# Patient Record
Sex: Female | Born: 1960
Health system: Southern US, Community
[De-identification: ages and names within clinical notes are randomized; demographics above are authoritative.]

## PROBLEM LIST (undated history)

## (undated) DIAGNOSIS — I10 Essential (primary) hypertension: Secondary | ICD-10-CM

## (undated) DIAGNOSIS — Z299 Encounter for prophylactic measures, unspecified: Secondary | ICD-10-CM

## (undated) DIAGNOSIS — I5021 Acute systolic (congestive) heart failure: Secondary | ICD-10-CM

## (undated) DIAGNOSIS — I480 Paroxysmal atrial fibrillation: Secondary | ICD-10-CM

## (undated) DIAGNOSIS — Z95810 Presence of automatic (implantable) cardiac defibrillator: Secondary | ICD-10-CM

## (undated) DIAGNOSIS — Z950 Presence of cardiac pacemaker: Secondary | ICD-10-CM

## (undated) DIAGNOSIS — J45909 Unspecified asthma, uncomplicated: Secondary | ICD-10-CM

## (undated) DIAGNOSIS — Z951 Presence of aortocoronary bypass graft: Secondary | ICD-10-CM

## (undated) DIAGNOSIS — F419 Anxiety disorder, unspecified: Secondary | ICD-10-CM

## (undated) DIAGNOSIS — T7840XA Allergy, unspecified, initial encounter: Secondary | ICD-10-CM

## (undated) DIAGNOSIS — C3490 Malignant neoplasm of unspecified part of unspecified bronchus or lung: Secondary | ICD-10-CM

## (undated) HISTORY — DX: Allergy, unspecified, initial encounter: T78.40XA

## (undated) HISTORY — DX: Unspecified asthma, uncomplicated: J45.909

## (undated) HISTORY — DX: Anxiety disorder, unspecified: F41.9

## (undated) HISTORY — DX: Malignant neoplasm of unspecified part of unspecified bronchus or lung: C34.90

## (undated) HISTORY — PX: TUBAL LIGATION: SHX77

## (undated) SURGERY — BRONCHOSCOPY, WITH EBUS
Anesthesia: General | Laterality: Right

---

## 2002-10-13 ENCOUNTER — Emergency Department (HOSPITAL_COMMUNITY): Admission: EM | Admit: 2002-10-13 | Discharge: 2002-10-13 | Payer: Self-pay | Admitting: Emergency Medicine

## 2003-07-15 ENCOUNTER — Other Ambulatory Visit: Admission: RE | Admit: 2003-07-15 | Discharge: 2003-07-15 | Payer: Self-pay | Admitting: Obstetrics and Gynecology

## 2003-08-28 ENCOUNTER — Ambulatory Visit (HOSPITAL_COMMUNITY): Admission: RE | Admit: 2003-08-28 | Discharge: 2003-08-28 | Payer: Self-pay | Admitting: Obstetrics and Gynecology

## 2003-08-28 ENCOUNTER — Encounter (INDEPENDENT_AMBULATORY_CARE_PROVIDER_SITE_OTHER): Payer: Self-pay

## 2004-03-23 ENCOUNTER — Emergency Department (HOSPITAL_COMMUNITY): Admission: EM | Admit: 2004-03-23 | Discharge: 2004-03-23 | Payer: Self-pay | Admitting: Emergency Medicine

## 2006-01-23 ENCOUNTER — Other Ambulatory Visit: Admission: RE | Admit: 2006-01-23 | Discharge: 2006-01-23 | Payer: Self-pay | Admitting: Obstetrics and Gynecology

## 2007-02-22 ENCOUNTER — Ambulatory Visit (HOSPITAL_COMMUNITY): Admission: RE | Admit: 2007-02-22 | Discharge: 2007-02-23 | Payer: Self-pay | Admitting: *Deleted

## 2007-02-22 ENCOUNTER — Encounter (INDEPENDENT_AMBULATORY_CARE_PROVIDER_SITE_OTHER): Payer: Self-pay | Admitting: Specialist

## 2007-09-26 ENCOUNTER — Ambulatory Visit (HOSPITAL_COMMUNITY): Admission: RE | Admit: 2007-09-26 | Discharge: 2007-09-26 | Payer: Self-pay | Admitting: *Deleted

## 2007-10-03 ENCOUNTER — Encounter: Admission: RE | Admit: 2007-10-03 | Discharge: 2007-10-03 | Payer: Self-pay | Admitting: *Deleted

## 2008-05-25 ENCOUNTER — Emergency Department (HOSPITAL_COMMUNITY): Admission: EM | Admit: 2008-05-25 | Discharge: 2008-05-25 | Payer: Self-pay | Admitting: Emergency Medicine

## 2008-12-16 ENCOUNTER — Encounter: Admission: RE | Admit: 2008-12-16 | Discharge: 2008-12-16 | Payer: Self-pay | Admitting: Obstetrics and Gynecology

## 2010-01-11 ENCOUNTER — Ambulatory Visit (HOSPITAL_COMMUNITY): Admission: RE | Admit: 2010-01-11 | Discharge: 2010-01-11 | Payer: Self-pay | Admitting: Obstetrics

## 2011-01-01 ENCOUNTER — Encounter: Payer: Self-pay | Admitting: *Deleted

## 2011-04-28 NOTE — Op Note (Signed)
NAME:  Brittany [name], Brittany [name]                        ACCOUNT NO.:  [id number]   MEDICAL RECORD NO.:  [id number]                   PATIENT TYPE:  AMB   LOCATION:  SDC                                  FACILITY:  [facility]   PHYSICIAN:  James A. Ashley Royalty, M.D.             DATE OF BIRTH:  [birth year]   DATE OF PROCEDURE:  08/28/2003  DATE OF DISCHARGE:                                 OPERATIVE REPORT   PREOPERATIVE DIAGNOSES:  1. Fibroid uterus.  2. Uterine polyp.  3. Abnormal uterine bleeding.   POSTOPERATIVE DIAGNOSES:  1. Fibroid uterus.  2. Uterine polyp.  3. Abnormal uterine bleeding.  4. Pathology pending.   PROCEDURES:  1. Diagnostic/operative hysteroscopy with polypectomy.  2. Dilatation and curettage.   SURGEON:  Rudy Jew. Ashley Royalty, M.D.   ANESTHESIA:  General.   ESTIMATED BLOOD LOSS:  50 mL.   COMPLICATIONS:  None.   PACKS AND DRAINS:  None.   DEFICIT:  400 mL.   PROCEDURE:  The patient was taken to the operating room and placed in the  dorsal supine position.  After adequate general anesthesia was administered,  she was placed in the lithotomy position and prepped and draped in the usual  manner for vaginal surgery.  A posterior weighted retractor was placed per  the vagina.  The anterior lip of the cervix was grasped with a single-tooth  tenaculum.  The uterus was gently sounded to approximately 8 cm and noted to  be anteverted.  The cervix was then serially dilated to a size 29 [place]  using [employer] dilators.  The resectoscope was then successfully placed into  the uterine cavity and images obtained.  Sorbitol was used as the distention  medium.  The left and right tubal ostia were identified without difficulty.  The anterior and posterior aspects of the upper uterine cavity were noted to  be normal.  In the lower uterine cavity posteriorly there was noted to be a  significant size polyp.  Using the resectoscope at 90 watts cutting wave  form and 60 watts coagulation  wave form, the polyp was successfully removed  and submitted to pathology separately for histological studies.  Hemostasis  was obtained with the coagulation wave form.   Next, attention was then turned to the uterine curettage.  A medium size  curette was introduced into the uterine cavity and a four quadrant curettage  performed.  The a therapeutic curettage was performed.  The curettings were  submitted to pathology for histologic studies.   The patient was felt to have benefitted maximally from the surgical  procedure at this point.  The vaginal instruments were removed.  There was a  small cervical laceration on the patient's right side from the tenaculum  site.  This was easily controlled with a 2-0 Vicryl interrupted suture.  Hemostasis was noted and the procedure terminated.   The patient was taken to the recovery room in excellent  condition.                                               James A. Ashley Royalty, M.D.    JAM/MEDQ  D:  08/28/2003  T:  08/29/2003  Job:  [id number]

## 2011-04-28 NOTE — H&P (Signed)
   NAME:  Brittany [name], [place]                        ACCOUNT NO.:  [id number]   MEDICAL RECORD NO.:  [id number]                   PATIENT TYPE:  AMB   LOCATION:  SDC                                  [facility]ACILITY:  [facility]   PHYSICIAN:  James A. Ashley Royalty, M.D.             DATE OF BIRTH:  Jun 29, 1961   DATE OF ADMISSION:  08/28/2003  DATE OF DISCHARGE:                                HISTORY & PHYSICAL   This is a 50 year old white female who was evaluated by Velora Mediate, NP, on  July 15, 2003, for abnormal uterine bleeding. She was found to have an  apparent fibroid uterus.  Subsequent ultrasound revealed two intramural  fibroids of 4.2 and 3.7 cm in greatest diameter respectively. The patient  subsequently had a sonohistogram and the sonohistogram revealed a 1.4 cm  polyp within the uterine segment with a posterior attachment. There were no  obvious intracavitary fibroids at that assessment. The patient presents for  diagnostic/operative hysteroscopy and dilatation and curettage.   MEDICATIONS:  None.   PAST MEDICAL HISTORY:  Medical--negative.  Surgical--negative.   ALLERGIES:  SULFA.   FAMILY HISTORY:  Positive for hypertension, diabetes, and ovarian cancer.   SOCIAL HISTORY:  The patient is a smoker. She does drink alcohol.   REVIEW OF SYMPTOMS:  Noncontributory.   PHYSICAL EXAMINATION:  Well-developed, well-nourished, and pleasant black  female in no acute distress, afebrile, and vital signs stable. Skin warm and  dry without lesions. No supraclavicular, cervical, or inguinal adenopathy.  HEENT is normocephalic. Neck is supple without thyromegaly. Chest/lungs are  clear. Cardiac exam reveals a regular, rate, and rhythm without murmurs,  rubs, or gallops. Breast exam is deferred. Abdomen is soft and nontender  without masses or organomegaly. Bowel sounds are active. Musculoskeletal  examination reveals full range of motion without edema, cyanosis, or CVA  tenderness. Pelvic examination  reveals external genitalia within normal  limits. Vagina and cervix without gross lesions. Bimanual examination  reveals an enlarged uterus of perhaps 12 x 7 x 6 cm and no adnexal masses  are palpable.   IMPRESSION:  1. Fibroid uterus.  2. Uterine polyp on sonohistogram.  3. Abnormal uterine bleeding.   PLAN:  1. Diagnostic/operative hysteroscopy.  2. Dilatation and curettage.   The risks, benefits, complications, and alternatives were fully discussed  with the patient. She states she understands and accepts. Questions are  invited and answered.                                               James A. Ashley Royalty, M.D.    JAM/MEDQ  D:  08/28/2003  T:  08/28/2003  Job:  [id number]

## 2011-04-28 NOTE — Op Note (Signed)
Brittany [name], Brittany [name]              ACCOUNT NO.:  [id number]   MEDICAL RECORD NO.:  [id number]          PATIENT TYPE:  AMB   LOCATION:  SDC                           [facility]ILITY:  [facility]   PHYSICIAN:  Brittany [name], M.D.  DATE OF BIRTH:  12/28/1960   DATE OF PROCEDURE:  02/22/2007  DATE OF DISCHARGE:                               OPERATIVE REPORT   PREOPERATIVE DIAGNOSES:  Abnormal bleeding and fibroids.   POSTOPERATIVE DIAGNOSES:  Abnormal bleeding and fibroids.   PROCEDURE:  Total laparoscopic hysterectomy, bilateral salpingo-  oophorectomy and closure of cystotomy and cystoscopy.   SURGEON:  Brittany [name], M.D.   ASSISTANT:  Brittany [name], M.D.   ANESTHESIA:  General anesthesia.   SPECIMENS:  Uterus, cervix, tubes and ovaries - 328 grams.   ESTIMATED BLOOD LOSS:  200 mL.   COMPLICATIONS:  Cystotomy at the dome of the bladder, closed in layers.   INDICATIONS FOR PROCEDURE:  A patient with the above history, for  definitive surgical treatment.  Advised of the risks of surgery  including infection, bleeding, damage to surrounding organs.  She is  status post one previous C-section.   DESCRIPTION OF PROCEDURE:  The patient is taken to the operating room  and general anesthesia was obtained.  She is prepped and draped in the  standard fashion.  The bladder was drained with the Foley catheter.  The  RUMI attached and secured.   A 10 mm incision placed at the umbilicus and carried sharply to the  fascia.  The fascia was divided sharply.  The posterior sheath and  peritoneum elevated and entered sharply.  A pursestring suture of #0  Vicryl placed around the fascial defect.  A Hasson cannula inserted and  secured.  A pneumoperitoneum with CO2 gas.  The 11 mm port placed just  below the umbilicus on the left, 5 mm on the right.  Trendelenburg  position obtained.  The uterus was inspected.  The bladder was noted to  be densely adherent anteriorly.  The course of each ureter was  identified posteriorly.  The tubes and ovaries appeared normal.  The  uterus was enlarged, consistent with fibroids.  The left round ligament  was placed on traction, sealed and divided with the Gyrus.  Posteriorly  the broad ligament opened.  The retroperitoneal space identified.  The  ureter again seen. The left tube and ovary were elevated.  The course of  the ureter identified.  The left IP ligament was sealed and divided with  the bipolar cutting forceps and dissection continued until it was free.  This was repeated on the right side in the same manner.   The bladder was being mobilized sharply from very dense adhesions to the  uterus anteriorly, when a cystotomy occurred at the dome of the bladder.  This was recognized immediately and closed in layers with #3-0 Vicryl  via the laparascope.  It was water-tight.  The remainder of the bladder  was mobilized and the left uterine artery skeletonized, sealed and  divided with the Gyrus bipolar.  I repeated the entire procedure on the  right  side in the same manner.  The KOH cup was elevated.  An anterior  colpotomy made with the Plasma spatula and carried around to the vaginal  angles, which were sealed with the Gyrus.  The uterus mobilized  anteriorly and a posterior colpotomy made with the Plasma spatula and  the uterus pulled into the vagina.  The uterus, tubes and ovaries were  pulled into the vagina.  The uterus was bulky and needed to be  morcellated for delivery.  This was done vaginally after a weighted  speculum was placed in the vagina and a Deaver placed in the anterior  cul-de-sac.  The vaginal cuff was then closed with interrupted stitches  of #0 Vicryl.  Indigo carmine dye was given IV around this time.  Hemostasis was obtained.   Attention turned to the vagina.  The Foley removed.  The 70-degree  cystoscope was inserted.  The bladder injury was identified at the dome  of the bladder.  It was well away from the trigone.   Both ureters were  identified.  The ureteral orifices were both seen to spill indigo  carmine.  The cystoscope was removed.   Attention was returned to the abdomen.  The bladder was noted to be  water-tight after being distended from the cystoscopy.  The bladder was  drained with the Foley. The cuff was re-inspected, as were the IP  pedicles.  Everything was hemostatic.  The inferior ports removed.  Those sites were hemostatic.  The scope was removed, gas released, the  Hasson cannula removed.  The abdominal wall was elevated.  The  pursestring suture snugged down.  The skin was closed  with Dermabond.   The patient tolerated the procedure well and will be informed of her  cystotomy.  She will need to keep a Foley catheter in for one week and  will be placed on prophylactic antibiotics and will return to the office  for catheter removal.      Brittany [name], M.D.  Electronically Signed     WBD/MEDQ  D:  02/22/2007  T:  02/22/2007  Job:  [id number]

## 2011-09-07 LAB — POCT CARDIAC MARKERS
CKMB, poc: 1.5
Troponin i, poc: <0.05

## 2011-09-07 LAB — D-DIMER, QUANTITATIVE: D-Dimer, Quant: <0.22

## 2011-11-22 ENCOUNTER — Ambulatory Visit (INDEPENDENT_AMBULATORY_CARE_PROVIDER_SITE_OTHER): Payer: BC Managed Care – PPO

## 2011-11-22 ENCOUNTER — Encounter: Payer: Self-pay | Admitting: Emergency Medicine

## 2011-11-22 ENCOUNTER — Emergency Department (HOSPITAL_COMMUNITY)
Admission: EM | Admit: 2011-11-22 | Discharge: 2011-11-22 | Disposition: A | Discharge status: Home / Self Care | Payer: BC Managed Care – PPO | Attending: Emergency Medicine | Admitting: Emergency Medicine

## 2011-11-22 ENCOUNTER — Emergency Department (HOSPITAL_COMMUNITY): Payer: BC Managed Care – PPO

## 2011-11-22 DIAGNOSIS — R0609 Other forms of dyspnea: Secondary | ICD-10-CM | POA: Insufficient documentation

## 2011-11-22 DIAGNOSIS — R079 Chest pain, unspecified: Secondary | ICD-10-CM

## 2011-11-22 DIAGNOSIS — R51 Headache: Secondary | ICD-10-CM

## 2011-11-22 DIAGNOSIS — I10 Essential (primary) hypertension: Secondary | ICD-10-CM

## 2011-11-22 DIAGNOSIS — R0989 Other specified symptoms and signs involving the circulatory and respiratory systems: Secondary | ICD-10-CM | POA: Insufficient documentation

## 2011-11-22 DIAGNOSIS — R209 Unspecified disturbances of skin sensation: Secondary | ICD-10-CM | POA: Insufficient documentation

## 2011-11-22 DIAGNOSIS — M62838 Other muscle spasm: Secondary | ICD-10-CM

## 2011-11-22 HISTORY — DX: Essential (primary) hypertension: I10

## 2011-11-22 LAB — BASIC METABOLIC PANEL
BUN: 12 mg/dL (ref 6–23)
Chloride: 107 mEq/L (ref 96–112)
Creatinine, Ser: 0.59 mg/dL (ref 0.50–1.10)
GFR calc Af Amer: >90 mL/min (ref >90)
GFR calc non Af Amer: >90 mL/min (ref >90)
Glucose, Bld: 99 mg/dL (ref 70–99)

## 2011-11-22 LAB — URINALYSIS, ROUTINE W REFLEX MICROSCOPIC
Glucose, UA: NEGATIVE mg/dL
Hgb urine dipstick: NEGATIVE
Specific Gravity, Urine: 1.026 (ref 1.005–1.030)
pH: 5.5 (ref 5.0–8.0)

## 2011-11-22 MED ORDER — LISINOPRIL 10 MG PO TABS: 10.0000 mg | ORAL_TABLET | Freq: Every day | ORAL | Status: DC

## 2011-11-22 NOTE — ED Provider Notes (Signed)
History     CSN: [id number] Arrival date & time: 11/22/2011  3:01 PM   First MD Initiated Contact with Patient 11/22/11 1510      Chief Complaint  Patient presents with  . Hypertension    (Consider location/radiation/quality/duration/timing/severity/associated sxs/prior treatment) The history is provided by the patient.   Patient is a 50 year old female with history of hypertension who presents with hypertension. The patient notes for the past day she has had increasing blood pressure. Next blood pressure recorded was 210/100. This has been waxing and waning throughout the day. She notes associated left arm tingling and mild generalized headache with this hypertension. She denies syncope, focal weakness, sensation loss, ataxia, vision changes, extremity swelling. She notes mild associated dyspnea with maximum blood pressure but is asymptomatic at this time. She reports history of hypertension that she used to take unknown medication for her. She thinks it may have been labetalol. She stopped taking this closer year ago and has been focusing on lifestyle modifications. She is followed by her primary care doctor for this hypertension. She denies any recent over-the-counter medication use, dietary indiscretion. She does note that she has had increasing social stress lately related to work and family. Overall severity is described as moderate.  Past Medical History  Diagnosis Date  . Hypertension    no history of coronary artery disease.  History reviewed. No pertinent past surgical history.  History reviewed. No pertinent family history.  History  Substance Use Topics  . Smoking status: Not on file  . Smokeless tobacco: Not on file  . Alcohol Use:      OB History    Grav Para Term Preterm Abortions TAB SAB Ect Mult Living                  Review of Systems  Constitutional: Negative for fever and chills.  HENT: Negative for facial swelling.   Eyes: Negative for visual  disturbance.  Respiratory: Negative for cough, chest tightness, shortness of breath and wheezing.   Cardiovascular: Negative for chest pain.  Gastrointestinal: Negative for nausea, vomiting, abdominal pain and diarrhea.  Genitourinary: Negative for difficulty urinating.  Skin: Negative for rash.  Neurological: Positive for headaches. Negative for weakness and numbness.  Psychiatric/Behavioral: Negative for behavioral problems and confusion.  All other systems reviewed and are negative.    Allergies  Codeine; Iodinated diagnostic agents; and Sulfa antibiotics  Home Medications   Current Outpatient Rx  Name Route Sig Dispense Refill  . BUDESONIDE-FORMOTEROL FUMARATE 160-4.5 MCG/ACT IN AERO Inhalation Inhale 2 puffs into the lungs 2 (two) times daily.      . ALBUTEROL SULFATE HFA 108 (90 BASE) MCG/ACT IN AERS Inhalation Inhale 2 puffs into the lungs every 6 (six) hours as needed. For shortness of breath     . LISINOPRIL 10 MG PO TABS Oral Take 1 tablet (10 mg total) by mouth daily. 30 tablet 0    BP 171/89  Pulse 73  Temp(Src) 97.6 F (36.4 C) (Oral)  Resp 16  SpO2 100%  Physical Exam  Nursing note and vitals reviewed. Constitutional: She is oriented to person, place, and time. She appears well-developed and well-nourished. No distress.  HENT:  Head: Normocephalic.  Mouth/Throat: Oropharynx is clear and moist.  Eyes: EOM are normal. Pupils are equal, round, and reactive to light. No scleral icterus.  Neck: Normal range of motion. Neck supple.       No bruits  Cardiovascular: Normal rate, regular rhythm and intact distal pulses.  No murmur heard. Pulmonary/Chest: Effort normal. No respiratory distress. She has no wheezes. She has no rales.  Abdominal: Soft. She exhibits no distension. There is no tenderness.  Musculoskeletal: Normal range of motion. She exhibits no edema and no tenderness.  Neurological: She is alert and oriented to person, place, and time. She has normal  strength. No cranial nerve deficit or sensory deficit. She exhibits normal muscle tone. Coordination normal. GCS eye subscore is 4. GCS verbal subscore is 5. GCS motor subscore is 6.  Skin: Skin is warm and dry. No rash noted. She is not diaphoretic.  Psychiatric: She has a normal mood and affect. Her behavior is normal. Thought content normal.    ED Course  Procedures (including critical care time)   Date: 11/22/2011  Rate: 60  Rhythm: normal sinus rhythm  QRS Axis: right  Intervals: normal  ST/T Wave abnormalities: normal  Conduction Disutrbances:none  Narrative Interpretation: New R Axis deviation, no acute ischemia. No hypertrophy  Old EKG Reviewed: new R axis deviation   Labs Reviewed  URINALYSIS, ROUTINE W REFLEX MICROSCOPIC - Abnormal; Notable for the following:    Color, Urine AMBER (*) BIOCHEMICALS MAY BE AFFECTED BY COLOR   Bilirubin Urine SMALL (*)    Ketones, ur 15 (*)    All other components within normal limits  BASIC METABOLIC PANEL   Dg Chest 2 View  11/22/2011  *RADIOLOGY REPORT*  Clinical Data: Hypertension  CHEST - 2 VIEW  Comparison: 05/25/2008  Findings: Artifact overlies chest.  Heart size is at the upper limits of normal.  Mediastinal shadows are normal.  The lungs are clear except for mild scarring at the right base.  No infiltrate, mass, effusion or collapse.  No significant bony finding.  IMPRESSION: No active disease.  Original Report Authenticated By: Thomasenia Sales, M.D.     1. Hypertension       MDM   Patient with known hypertension here with hypertension and symptoms. Clinical picture is consistent with hypertensive urgency.  She reports recent blood pressures in the 200 range but here, systolic pressures in the 170s. She has not been taking oral antihypertensives for some time. She denied recent over-the-counter medications or other situations that may precipitate this rise in blood pressure. Evaluation today is negative for evidence of endorgan  damage. Patient's neurologic exam is normal. She is well appearing and asymptomatic on discharge. Her symptoms are typical for hypertensive urgency and I do not suspect ischemic cardiac etiology.  Patient has primary care doctor. I discussed her likely need to be on chronic antihypertensive therapy now. She would prefer to start something from the emergency department. Based on her sulfa allergy, I will start lisinopril. Discussed importance of close followup as well as recheck of renal function. Strict return precautions discussed.        Milus Glazier 11/23/11 0221

## 2011-11-22 NOTE — ED Notes (Signed)
Written instructions reviewed and given to pt with RX for Lisinopril.  Pt voices understanding.  Unable to sign due to computer not working

## 2011-11-22 NOTE — Progress Notes (Addendum)
50 year old female has been off of blood pressure medicine for the last 6 months and today had a headache and some tingling in her left arm. She states that she feels this is from her blood pressure being elevated and when it's happened in the past she has checked her blood pressure and it has indeed been elevated. She admits that she does not check her blood pressure on days when she does not have the symptoms. In the emergency department, she does show moderate hypertension with blood pressure 179 systolic. Her fundi show no evidence of hypertensive retinopathy. Lungs are clear and heart has regular rate and rhythm. I've discussed with her the need for daily blood pressure monitoring and not just when she has symptoms. Labs will be checked to look for any evidence of endorgan damage.  I have reviewed the resident's interpretation of the ECG and agree with it

## 2011-11-22 NOTE — ED Notes (Signed)
Per EMS pt coming from PCP where BP was 215/118.  EMS BP was 165/96.  Pt has history of hypertension but stopped taking her meds b/c she doesn't like them

## 2011-11-22 NOTE — ED Notes (Signed)
Pt resting quietly at this time with no complaints.  Husband at bedside.

## 2011-11-25 NOTE — ED Provider Notes (Signed)
I saw and evaluated the patient, reviewed the resident's note and I agree with the findings and plan.   Dione Booze, MD 11/25/11 [phone]

## 2012-01-04 ENCOUNTER — Ambulatory Visit (INDEPENDENT_AMBULATORY_CARE_PROVIDER_SITE_OTHER): Payer: BC Managed Care – PPO

## 2012-01-04 DIAGNOSIS — I10 Essential (primary) hypertension: Secondary | ICD-10-CM

## 2012-03-15 ENCOUNTER — Telehealth: Payer: Self-pay

## 2012-03-15 MED ORDER — ALBUTEROL SULFATE HFA 108 (90 BASE) MCG/ACT IN AERS: 2.0000 | INHALATION_SPRAY | Freq: Four times a day (QID) | RESPIRATORY_TRACT | Status: DC | PRN

## 2012-03-15 NOTE — Telephone Encounter (Signed)
Chart is at nurses station for review 

## 2012-03-15 NOTE — Telephone Encounter (Signed)
1- needs ov 2- we have a 24-48h turn around on rf refills are future knowledge. 3- we have never Rx nor have record that pt is on wellbutrin 4- I sent a RF for her albuterol to the pharmacy.

## 2012-03-15 NOTE — Telephone Encounter (Signed)
PT STATES SHE HAD CALLED [facility] FOR HER WELBUTRIN AND INHALER, WAS TOLD BY THEM IT TAKES HRS FOR [place] TO GET BACK WITH THEM AND SHE IS IN DIRE NEED OF HER INHALER. PLEASE CALL PT AT [phone]    [facility]

## 2012-03-15 NOTE — Telephone Encounter (Signed)
Pt.notified

## 2012-05-07 ENCOUNTER — Ambulatory Visit (INDEPENDENT_AMBULATORY_CARE_PROVIDER_SITE_OTHER): Payer: BC Managed Care – PPO | Admitting: Physician Assistant

## 2012-05-07 VITALS — BP 146/87 | HR 96 | Temp 98.9°F | Resp 18 | Ht 59.5 in | Wt 125.0 lb

## 2012-05-07 DIAGNOSIS — J9801 Acute bronchospasm: Secondary | ICD-10-CM

## 2012-05-07 DIAGNOSIS — J209 Acute bronchitis, unspecified: Secondary | ICD-10-CM

## 2012-05-07 DIAGNOSIS — I10 Essential (primary) hypertension: Secondary | ICD-10-CM | POA: Insufficient documentation

## 2012-05-07 DIAGNOSIS — J45909 Unspecified asthma, uncomplicated: Secondary | ICD-10-CM | POA: Insufficient documentation

## 2012-05-07 DIAGNOSIS — J40 Bronchitis, not specified as acute or chronic: Secondary | ICD-10-CM

## 2012-05-07 DIAGNOSIS — Z131 Encounter for screening for diabetes mellitus: Secondary | ICD-10-CM

## 2012-05-07 LAB — GLUCOSE, POCT (MANUAL RESULT ENTRY): POC Glucose: 113 mg/dl — AB (ref 70–99)

## 2012-05-07 MED ORDER — FLUTICASONE PROPIONATE 50 MCG/ACT NA SUSP: 2.0000 | Freq: Every day | NASAL | Status: DC

## 2012-05-07 MED ORDER — AZITHROMYCIN 250 MG PO TABS: ORAL_TABLET | ORAL | Status: AC

## 2012-05-07 MED ORDER — ALBUTEROL SULFATE HFA 108 (90 BASE) MCG/ACT IN AERS: 2.0000 | INHALATION_SPRAY | Freq: Four times a day (QID) | RESPIRATORY_TRACT | Status: DC | PRN

## 2012-05-07 MED ORDER — METHYLPREDNISOLONE ACETATE 80 MG/ML IJ SUSP
80.0000 mg | Freq: Once | INTRAMUSCULAR | Status: AC
  Administered 2012-05-07: 80 mg via INTRAMUSCULAR

## 2012-05-07 MED ORDER — AZITHROMYCIN 250 MG PO TABS: ORAL_TABLET | ORAL | Status: DC

## 2012-05-07 MED ORDER — PROMETHAZINE-DM 6.25-15 MG/5ML PO SYRP: 5.0000 mL | ORAL_SOLUTION | Freq: Four times a day (QID) | ORAL | Status: AC | PRN

## 2012-05-07 MED ORDER — ALBUTEROL SULFATE (2.5 MG/3ML) 0.083% IN NEBU: 2.5000 mg | INHALATION_SOLUTION | Freq: Four times a day (QID) | RESPIRATORY_TRACT | Status: DC | PRN

## 2012-05-07 MED ORDER — PROMETHAZINE-DM 6.25-15 MG/5ML PO SYRP: 5.0000 mL | ORAL_SOLUTION | Freq: Four times a day (QID) | ORAL | Status: DC | PRN

## 2012-05-07 NOTE — Progress Notes (Signed)
  Subjective:    Patient ID: Brittany [name], female    DOB: [birth year], 51 y.o.   MRN: [id number]  HPI 51 yo AAF presents w a 10 day h/o cough, nasal congestion.  She is using her albuterol inhaler every 6 hrs.  She has only been using her symbicort once daily.  She needs a new nebulizer bc she misplaced hers in a relocation.  Also c/o ear pressure-esp on recent flight back from [facility]. No f/c   Review of Systems  All other systems reviewed and are negative.       Objective:   Physical Exam  Nursing note and vitals reviewed. Constitutional: She is oriented to person, place, and time. She appears well-developed and well-nourished.  HENT:  Head: Normocephalic and atraumatic.  Right Ear: External ear normal.  Left Ear: External ear normal.  Mouth/Throat: Oropharynx is clear and moist. No oropharyngeal exudate.       TM B congested, no erythema  Neck: Normal range of motion. Neck supple.  Cardiovascular: Normal rate, regular rhythm and normal heart sounds.   Pulmonary/Chest: Effort normal. No respiratory distress. She has wheezes (WHEEZES present throughout B with forced expiration). She has no rales. She exhibits no tenderness.  Lymphadenopathy:    She has no cervical adenopathy.  Neurological: She is alert and oriented to person, place, and time.  Skin: Skin is warm and dry.     Results for orders placed in visit on 05/07/12  GLUCOSE, POCT (MANUAL RESULT ENTRY)      Component Value Range   POC Glucose 113 (*) 70 - 99 (mg/dl)        Assessment & Plan:  Asthmatic bronchitis-see meds, fluids and rest

## 2012-05-24 ENCOUNTER — Other Ambulatory Visit: Payer: Self-pay | Admitting: Family Medicine

## 2012-08-27 ENCOUNTER — Telehealth: Payer: Self-pay

## 2012-08-27 MED ORDER — LISINOPRIL 10 MG PO TABS: 10.0000 mg | ORAL_TABLET | Freq: Every day | ORAL | Status: DC

## 2012-08-27 MED ORDER — BUDESONIDE-FORMOTEROL FUMARATE 160-4.5 MCG/ACT IN AERO: 2.0000 | INHALATION_SPRAY | Freq: Two times a day (BID) | RESPIRATORY_TRACT | Status: DC

## 2012-08-27 NOTE — Telephone Encounter (Signed)
Patient notified

## 2012-08-27 NOTE — Telephone Encounter (Signed)
Refills sent in. Will need office visit for further refills.

## 2012-08-27 NOTE — Telephone Encounter (Signed)
Patent requesting Rx for Symbicort 160-1.5 and Lisinopril 10mg  to [facility]. Patient due for appt she states she can possibly come in next week, advise if okay

## 2012-08-27 NOTE — Telephone Encounter (Signed)
Pt is out bp medication and simbacort she is asking to refill rx's for one more month and she will be able to come in for appt. [phone]

## 2012-09-12 ENCOUNTER — Other Ambulatory Visit: Payer: Self-pay | Admitting: Internal Medicine

## 2012-09-12 DIAGNOSIS — Z1231 Encounter for screening mammogram for malignant neoplasm of breast: Secondary | ICD-10-CM

## 2012-10-01 ENCOUNTER — Ambulatory Visit (HOSPITAL_COMMUNITY): Payer: BC Managed Care – PPO | Attending: Internal Medicine

## 2012-10-19 ENCOUNTER — Other Ambulatory Visit: Payer: Self-pay | Admitting: Physician Assistant

## 2012-10-19 NOTE — Telephone Encounter (Signed)
Needs office visit 2nd notice 

## 2012-10-28 ENCOUNTER — Ambulatory Visit (HOSPITAL_COMMUNITY): Payer: BC Managed Care – PPO

## 2012-10-31 ENCOUNTER — Ambulatory Visit (HOSPITAL_COMMUNITY): Admission: RE | Admit: 2012-10-31 | Payer: BC Managed Care – PPO | Source: Ambulatory Visit

## 2013-01-03 ENCOUNTER — Other Ambulatory Visit: Payer: Self-pay | Admitting: Physician Assistant

## 2013-01-10 ENCOUNTER — Ambulatory Visit (INDEPENDENT_AMBULATORY_CARE_PROVIDER_SITE_OTHER): Payer: BC Managed Care – PPO | Admitting: Emergency Medicine

## 2013-01-10 VITALS — BP 190/85 | HR 69 | Temp 98.1°F | Resp 16 | Ht 59.5 in | Wt 129.0 lb

## 2013-01-10 DIAGNOSIS — I10 Essential (primary) hypertension: Secondary | ICD-10-CM

## 2013-01-10 DIAGNOSIS — G4700 Insomnia, unspecified: Secondary | ICD-10-CM

## 2013-01-10 MED ORDER — LISINOPRIL 10 MG PO TABS: 10.0000 mg | ORAL_TABLET | Freq: Every day | ORAL | Status: DC

## 2013-01-10 MED ORDER — AMLODIPINE BESYLATE 5 MG PO TABS: 5.0000 mg | ORAL_TABLET | Freq: Every day | ORAL | Status: DC

## 2013-01-10 MED ORDER — TEMAZEPAM 30 MG PO CAPS: 30.0000 mg | ORAL_CAPSULE | Freq: Every evening | ORAL | Status: DC | PRN

## 2013-01-10 MED ORDER — ALBUTEROL SULFATE HFA 108 (90 BASE) MCG/ACT IN AERS: 2.0000 | INHALATION_SPRAY | Freq: Four times a day (QID) | RESPIRATORY_TRACT | Status: DC | PRN

## 2013-01-10 MED ORDER — BUDESONIDE-FORMOTEROL FUMARATE 160-4.5 MCG/ACT IN AERO: 2.0000 | INHALATION_SPRAY | Freq: Two times a day (BID) | RESPIRATORY_TRACT | Status: DC

## 2013-01-10 NOTE — Progress Notes (Signed)
[facility] [address] [place] [address] [phone]  Date:  01/10/2013   Name:  Brittany [name]   DOB:  November 13, 1961   MRN:  [id number]  PCP:  No primary provider on file.    Chief Complaint: Hypertension   History of Present Illness:  Brittany [name] is a 52 y.o. very pleasant female patient who presents with the following:  On norvasc for hypertension and ran out three days ago.  Now has elevated pressure and a headache.  No visual symptoms or cardiovascular complaints.  No neuro symptoms, nausea or vomiting.  Says her BP runs on meds at 147/79 range. Relates a long history of difficulty remaining asleep and awakens usually after less than 6 hours and more often 3 hours of sleep nightly  Patient Active Problem List  Diagnosis  . HTN (hypertension)  . Asthma    Past Medical History  Diagnosis Date  . Hypertension   . Allergy   . Asthma   . Anxiety     No past surgical history on file.  History  Substance Use Topics  . Smoking status: Current Every Day Smoker -- 0.5 packs/day    Types: Cigarettes  . Smokeless tobacco: Not on file  . Alcohol Use: Not on file    Family History  Problem Relation Age of Onset  . Heart disease Mother     Allergies  Allergen Reactions  . Codeine Nausea And Vomiting  . Iodinated Diagnostic Agents Hives and Rash  . Sulfa Antibiotics Hives and Rash    Medication list has been reviewed and updated.  Current Outpatient Prescriptions on File Prior to Visit  Medication Sig Dispense Refill  . albuterol (PROVENTIL) (2.5 MG/3ML) 0.083% nebulizer solution Take 3 mLs (2.5 mg total) by nebulization every 6 (six) hours as needed for wheezing.  150 mL  1  . albuterol (PROVENTIL HFA;VENTOLIN HFA) 108 (90 BASE) MCG/ACT inhaler Inhale 2 puffs into the lungs every 6 (six) hours as needed. For shortness of breath  1 Inhaler  1  . amLODipine (NORVASC) 5 MG tablet Take 1 tablet (5 mg total) by mouth daily.  NEEDS OFFICE VISIT/LABS FOR MORE  30 tablet  0  . budesonide-formoterol (SYMBICORT) 160-4.5 MCG/ACT inhaler Inhale 2 puffs into the lungs 2 (two) times daily.  1 Inhaler  0  . fluticasone (FLONASE) 50 MCG/ACT nasal spray Place 2 sprays into the nose daily.  16 g  6  . lisinopril (PRINIVIL,ZESTRIL) 10 MG tablet TAKE 1 TABLET (10 MG TOTAL) BY MOUTH DAILY.  30 tablet  0    Review of Systems:  As per HPI, otherwise negative.    Physical Examination: Filed Vitals:   01/10/13 1624  BP: 190/85  Pulse: 69  Temp: 98.1 F (36.7 C)  Resp: 16   Filed Vitals:   01/10/13 1624  Height: 4' 11.5" (1.511 m)  Weight: 129 lb (58.514 kg)   Body mass index is 25.62 kg/(m^2). Ideal Body Weight: Weight in (lb) to have BMI = 25: 125.6   GEN: WDWN, NAD, Non-toxic, A & O x 3 HEENT: Atraumatic, Normocephalic. Neck supple. No masses, No LAD. Ears and Nose: No external deformity. CV: RRR, No M/G/R. No JVD. No thrill. No extra heart sounds. PULM: CTA B, no wheezes, crackles, rhonchi. No retractions. No resp. distress. No accessory muscle use. ABD: S, NT, ND, +BS. No rebound. No HSM. EXTR: No c/c/e NEURO Normal gait.  PSYCH: Normally interactive. Conversant. Not depressed or anxious appearing.  Calm demeanor.    Assessment and Plan: Hypertension insomnia Refill meds Resume lisinopril restoril  Reinforced that she may not run out of medication for hypertension Follow up in one month  Carmelina Dane, MD

## 2013-01-10 NOTE — Patient Instructions (Addendum)

## 2013-01-10 NOTE — Addendum Note (Signed)
Addended by: Carmelina Dane on: 01/10/2013 04:50 PM   Modules accepted: Orders

## 2013-06-11 ENCOUNTER — Other Ambulatory Visit: Payer: Self-pay | Admitting: Emergency Medicine

## 2013-11-25 ENCOUNTER — Other Ambulatory Visit: Payer: Self-pay | Admitting: Physician Assistant

## 2013-12-30 ENCOUNTER — Ambulatory Visit (INDEPENDENT_AMBULATORY_CARE_PROVIDER_SITE_OTHER): Payer: BC Managed Care – PPO | Admitting: Emergency Medicine

## 2013-12-30 VITALS — BP 144/100 | HR 96 | Temp 98.5°F | Resp 16 | Ht 59.5 in | Wt 123.0 lb

## 2013-12-30 DIAGNOSIS — I10 Essential (primary) hypertension: Secondary | ICD-10-CM

## 2013-12-30 DIAGNOSIS — I208 Other forms of angina pectoris: Secondary | ICD-10-CM

## 2013-12-30 DIAGNOSIS — I209 Angina pectoris, unspecified: Secondary | ICD-10-CM

## 2013-12-30 DIAGNOSIS — Z720 Tobacco use: Secondary | ICD-10-CM

## 2013-12-30 DIAGNOSIS — F17200 Nicotine dependence, unspecified, uncomplicated: Secondary | ICD-10-CM

## 2013-12-30 MED ORDER — VARENICLINE TARTRATE 0.5 MG PO TABS: ORAL_TABLET | ORAL | Status: DC

## 2013-12-30 MED ORDER — METOPROLOL SUCCINATE ER 50 MG PO TB24: 50.0000 mg | ORAL_TABLET | Freq: Every day | ORAL | Status: DC

## 2013-12-30 MED ORDER — VARENICLINE TARTRATE 1 MG PO TABS: 1.0000 mg | ORAL_TABLET | Freq: Two times a day (BID) | ORAL | Status: DC

## 2013-12-30 NOTE — Progress Notes (Signed)
[facility] [name], [place] [address] [phone]  Date:  12/30/2013   Name:  Brittany [name]   DOB:  11-05-1961   MRN:  [id number]  PCP:  No primary provider on file.    Chief Complaint: Hypertension   History of Present Illness:  Brittany [name] is a 53 y.o. very pleasant female patient who presents with the following:  Patient has hypertension on amlodipine and lisinopril.  Her pressure has been running in the 150/100 range and with stress increase 180/110.  With BP elevations she has some numbness in her left arm associated with some shortness of breath at rest.  No chest pain or tightness or heaviness.  Had a stress test several years ago associated with a rapid elevation in BP.  No improvement with over the counter medications or other home remedies. Smokes. No DM or HLD.  No improvement with over the counter medications or other home remedies. Denies other complaint or health concern today.   Patient Active Problem List   Diagnosis Date Noted  . HTN (hypertension) 05/07/2012  . Asthma 05/07/2012    Past Medical History  Diagnosis Date  . Hypertension   . Allergy   . Asthma   . Anxiety     History reviewed. No pertinent past surgical history.  History  Substance Use Topics  . Smoking status: Current Every Day Smoker -- 0.50 packs/day    Types: Cigarettes  . Smokeless tobacco: Not on file  . Alcohol Use: Not on file    Family History  Problem Relation Age of Onset  . Heart disease Mother     Allergies  Allergen Reactions  . Codeine Nausea And Vomiting  . Iodinated Diagnostic Agents Hives and Rash  . Sulfa Antibiotics Hives and Rash    Medication list has been reviewed and updated.  Current Outpatient Prescriptions on File Prior to Visit  Medication Sig Dispense Refill  . albuterol (PROVENTIL HFA;VENTOLIN HFA) 108 (90 BASE) MCG/ACT inhaler Inhale 2 puffs into the lungs every 6 (six) hours as needed. For  shortness of breath  1 Inhaler  12  . amLODipine (NORVASC) 5 MG tablet Take 1 tablet (5 mg total) by mouth daily.  30 tablet  5  . budesonide-formoterol (SYMBICORT) 160-4.5 MCG/ACT inhaler Inhale 2 puffs into the lungs 2 (two) times daily.  1 Inhaler  12  . lisinopril (PRINIVIL,ZESTRIL) 10 MG tablet Take 1 tablet (10 mg total) by mouth daily. PATIENT NEEDS OFFICE VISIT FOR ADDITIONAL REFILLS - 2nd NOTICE  15 tablet  0  . temazepam (RESTORIL) 30 MG capsule Take 1 capsule (30 mg total) by mouth at bedtime as needed for sleep.  30 capsule  0  . albuterol (PROVENTIL) (2.5 MG/3ML) 0.083% nebulizer solution Take 3 mLs (2.5 mg total) by nebulization every 6 (six) hours as needed for wheezing.  150 mL  1  . fluticasone (FLONASE) 50 MCG/ACT nasal spray Place 2 sprays into the nose daily.  16 g  6   No current facility-administered medications on file prior to visit.    Review of Systems:  As per HPI, otherwise negative.    Physical Examination: Filed Vitals:   12/30/13 1443  BP: 144/100  Pulse: 96  Temp: 98.5 F (36.9 C)  Resp: 16   Filed Vitals:   12/30/13 1443  Height: 4' 11.5" (1.511 m)  Weight: 123 lb (55.792 kg)   Body mass index is 24.44 kg/(m^2). Ideal Body Weight: Weight in (lb) to have  BMI = 25: 125.6  GEN: WDWN, NAD, Non-toxic, A & O x 3 HEENT: Atraumatic, Normocephalic. Neck supple. No masses, No LAD. Ears and Nose: No external deformity. CV: RRR, No M/G/R. No JVD. No thrill. No extra heart sounds. PULM: CTA B, no wheezes, crackles, rhonchi. No retractions. No resp. distress. No accessory muscle use. ABD: S, NT, ND, +BS. No rebound. No HSM. EXTR: No c/c/e NEURO Normal gait.  PSYCH: Normally interactive. Conversant. Not depressed or anxious appearing.  Calm demeanor.   Assessment and Plan:  Stop lisinopril Start toprol Continue amlodipine Cardiology referral   Signed,  Ellison Carwin, MD

## 2013-12-30 NOTE — Patient Instructions (Signed)
Smoking Cessation Quitting smoking is important to your health and has many advantages. However, it is not always easy to quit since nicotine is a very addictive drug. Often times, people try 3 times or more before being able to quit. This document explains the best ways for you to prepare to quit smoking. Quitting takes hard work and a lot of effort, but you can do it. ADVANTAGES OF QUITTING SMOKING  You will live longer, feel better, and live better.  Your body will feel the impact of quitting smoking almost immediately.  Within 20 minutes, blood pressure decreases. Your pulse returns to its normal level.  After 8 hours, carbon monoxide levels in the blood return to normal. Your oxygen level increases.  After 24 hours, the chance of having a heart attack starts to decrease. Your breath, hair, and body stop smelling like smoke.  After 48 hours, damaged nerve endings begin to recover. Your sense of taste and smell improve.  After 72 hours, the body is virtually free of nicotine. Your bronchial tubes relax and breathing becomes easier.  After 2 to 12 weeks, lungs can hold more air. Exercise becomes easier and circulation improves.  The risk of having a heart attack, stroke, cancer, or lung disease is greatly reduced.  After 1 year, the risk of coronary heart disease is cut in half.  After 5 years, the risk of stroke falls to the same as a nonsmoker.  After 10 years, the risk of lung cancer is cut in half and the risk of other cancers decreases significantly.  After 15 years, the risk of coronary heart disease drops, usually to the level of a nonsmoker.  If you are pregnant, quitting smoking will improve your chances of having a healthy baby.  The people you live with, especially any children, will be healthier.  You will have extra money to spend on things other than cigarettes. QUESTIONS TO THINK ABOUT BEFORE ATTEMPTING TO QUIT You may want to talk about your answers with your  caregiver.  Why do you want to quit?  If you tried to quit in the past, what helped and what did not?  What will be the most difficult situations for you after you quit? How will you plan to handle them?  Who can help you through the tough times? Your family? Friends? A caregiver?  What pleasures do you get from smoking? What ways can you still get pleasure if you quit? Here are some questions to ask your caregiver:  How can you help me to be successful at quitting?  What medicine do you think would be best for me and how should I take it?  What should I do if I need more help?  What is smoking withdrawal like? How can I get information on withdrawal? GET READY  Set a quit date.  Change your environment by getting rid of all cigarettes, ashtrays, matches, and lighters in your home, car, or work. Do not let people smoke in your home.  Review your past attempts to quit. Think about what worked and what did not. GET SUPPORT AND ENCOURAGEMENT You have a better chance of being successful if you have help. You can get support in many ways.  Tell your family, friends, and co-workers that you are going to quit and need their support. Ask them not to smoke around you.  Get individual, group, or telephone counseling and support. Programs are available at local hospitals and health centers. Call your local health department for   information about programs in your area.  Spiritual beliefs and practices may help some smokers quit.  Download a "quit meter" on your computer to keep track of quit statistics, such as how long you have gone without smoking, cigarettes not smoked, and money saved.  Get a self-help book about quitting smoking and staying off of tobacco. LEARN NEW SKILLS AND BEHAVIORS  Distract yourself from urges to smoke. Talk to someone, go for a walk, or occupy your time with a task.  Change your normal routine. Take a different route to work. Drink tea instead of coffee.  Eat breakfast in a different place.  Reduce your stress. Take a hot bath, exercise, or read a book.  Plan something enjoyable to do every day. Reward yourself for not smoking.  Explore interactive web-based programs that specialize in helping you quit. GET MEDICINE AND USE IT CORRECTLY Medicines can help you stop smoking and decrease the urge to smoke. Combining medicine with the above behavioral methods and support can greatly increase your chances of successfully quitting smoking.  Nicotine replacement therapy helps deliver nicotine to your body without the negative effects and risks of smoking. Nicotine replacement therapy includes nicotine gum, lozenges, inhalers, nasal sprays, and skin patches. Some may be available over-the-counter and others require a prescription.  Antidepressant medicine helps people abstain from smoking, but how this works is unknown. This medicine is available by prescription.  Nicotinic receptor partial agonist medicine simulates the effect of nicotine in your brain. This medicine is available by prescription. Ask your caregiver for advice about which medicines to use and how to use them based on your health history. Your caregiver will tell you what side effects to look out for if you choose to be on a medicine or therapy. Carefully read the information on the package. Do not use any other product containing nicotine while using a nicotine replacement product.  RELAPSE OR DIFFICULT SITUATIONS Most relapses occur within the first 3 months after quitting. Do not be discouraged if you start smoking again. Remember, most people try several times before finally quitting. You may have symptoms of withdrawal because your body is used to nicotine. You may crave cigarettes, be irritable, feel very hungry, cough often, get headaches, or have difficulty concentrating. The withdrawal symptoms are only temporary. They are strongest when you first quit, but they will go away within  10 14 days. To reduce the chances of relapse, try to:  Avoid drinking alcohol. Drinking lowers your chances of successfully quitting.  Reduce the amount of caffeine you consume. Once you quit smoking, the amount of caffeine in your body increases and can give you symptoms, such as a rapid heartbeat, sweating, and anxiety.  Avoid smokers because they can make you want to smoke.  Do not let weight gain distract you. Many smokers will gain weight when they quit, usually less than 10 pounds. Eat a healthy diet and stay active. You can always lose the weight gained after you quit.  Find ways to improve your mood other than smoking. FOR MORE INFORMATION  www.smokefree.gov  Document Released: 11/21/2001 Document Revised: 05/28/2012 Document Reviewed: 03/07/2012 [employer] Patient Information 2014 [employer].  

## 2013-12-30 NOTE — Addendum Note (Signed)
Addended by: Roselee Culver on: 12/30/2013 04:23 PM   Modules accepted: Orders

## 2013-12-30 NOTE — Addendum Note (Signed)
Addended by: Dan Humphreys on: 12/30/2013 04:00 PM   Modules accepted: Orders

## 2014-01-01 ENCOUNTER — Other Ambulatory Visit (INDEPENDENT_AMBULATORY_CARE_PROVIDER_SITE_OTHER): Payer: BC Managed Care – PPO | Admitting: Family Medicine

## 2014-01-01 DIAGNOSIS — I10 Essential (primary) hypertension: Secondary | ICD-10-CM

## 2014-01-01 DIAGNOSIS — I208 Other forms of angina pectoris: Secondary | ICD-10-CM

## 2014-01-01 DIAGNOSIS — Z720 Tobacco use: Secondary | ICD-10-CM

## 2014-01-01 DIAGNOSIS — I209 Angina pectoris, unspecified: Secondary | ICD-10-CM

## 2014-01-01 DIAGNOSIS — F17200 Nicotine dependence, unspecified, uncomplicated: Secondary | ICD-10-CM

## 2014-01-01 LAB — LIPID PANEL
CHOL/HDL RATIO: 5.7 ratio
Cholesterol: 279 mg/dL — ABNORMAL HIGH (ref 0–200)
HDL: 49 mg/dL (ref >39)
LDL Cholesterol: 208 mg/dL — ABNORMAL HIGH (ref 0–99)
Triglycerides: 109 mg/dL (ref <150)
VLDL: 22 mg/dL (ref 0–40)

## 2014-01-01 LAB — COMPREHENSIVE METABOLIC PANEL
ALK PHOS: 106 U/L (ref 39–117)
ALT: 14 U/L (ref 0–35)
AST: 18 U/L (ref 0–37)
Albumin: 4.3 g/dL (ref 3.5–5.2)
BUN: 8 mg/dL (ref 6–23)
CO2: 24 mEq/L (ref 19–32)
Calcium: 10 mg/dL (ref 8.4–10.5)
Chloride: 103 mEq/L (ref 96–112)
Creat: 0.56 mg/dL (ref 0.50–1.10)
GLUCOSE: 107 mg/dL — AB (ref 70–99)
Potassium: 3.8 mEq/L (ref 3.5–5.3)
SODIUM: 138 meq/L (ref 135–145)
TOTAL PROTEIN: 7.1 g/dL (ref 6.0–8.3)
Total Bilirubin: 1.6 mg/dL — ABNORMAL HIGH (ref 0.3–1.2)

## 2014-01-01 LAB — TSH: TSH: 0.844 u[IU]/mL (ref 0.350–4.500)

## 2014-01-01 LAB — CBC WITH DIFFERENTIAL/PLATELET
BASOS ABS: 0 10*3/uL (ref 0.0–0.1)
Basophils Relative: 0 % (ref 0–1)
Eosinophils Absolute: 0.1 10*3/uL (ref 0.0–0.7)
Eosinophils Relative: 2 % (ref 0–5)
HCT: 58.1 % — ABNORMAL HIGH (ref 36.0–46.0)
Hemoglobin: 19.9 g/dL — ABNORMAL HIGH (ref 12.0–15.0)
LYMPHS ABS: 2 10*3/uL (ref 0.7–4.0)
Lymphocytes Relative: 28 % (ref 12–46)
MCH: 33.7 pg (ref 26.0–34.0)
MCHC: 34.3 g/dL (ref 30.0–36.0)
MCV: 98.5 fL (ref 78.0–100.0)
MONO ABS: 0.6 10*3/uL (ref 0.1–1.0)
Monocytes Relative: 8 % (ref 3–12)
Neutro Abs: 4.4 10*3/uL (ref 1.7–7.7)
Neutrophils Relative %: 62 % (ref 43–77)
Platelets: 160 10*3/uL (ref 150–400)
RBC: 5.9 MIL/uL — ABNORMAL HIGH (ref 3.87–5.11)
RDW: 23.3 % — AB (ref 11.5–15.5)
WBC: 7.2 10*3/uL (ref 4.0–10.5)

## 2014-01-02 ENCOUNTER — Other Ambulatory Visit: Payer: Self-pay | Admitting: Emergency Medicine

## 2014-01-02 MED ORDER — ATORVASTATIN CALCIUM 20 MG PO TABS: 20.0000 mg | ORAL_TABLET | Freq: Every day | ORAL | Status: DC

## 2014-01-14 ENCOUNTER — Ambulatory Visit: Payer: BC Managed Care – PPO | Admitting: Internal Medicine

## 2014-01-20 ENCOUNTER — Other Ambulatory Visit: Payer: Self-pay | Admitting: Emergency Medicine

## 2014-01-27 ENCOUNTER — Ambulatory Visit: Payer: BC Managed Care – PPO | Admitting: Internal Medicine

## 2014-02-02 ENCOUNTER — Other Ambulatory Visit: Payer: Self-pay | Admitting: Emergency Medicine

## 2014-02-02 ENCOUNTER — Other Ambulatory Visit: Payer: Self-pay | Admitting: Physician Assistant

## 2014-02-24 ENCOUNTER — Ambulatory Visit: Payer: BC Managed Care – PPO

## 2014-02-24 ENCOUNTER — Ambulatory Visit (INDEPENDENT_AMBULATORY_CARE_PROVIDER_SITE_OTHER): Payer: BC Managed Care – PPO | Admitting: Emergency Medicine

## 2014-02-24 VITALS — BP 138/90 | HR 91 | Temp 99.7°F | Resp 20 | Ht 59.5 in | Wt 117.4 lb

## 2014-02-24 DIAGNOSIS — R059 Cough, unspecified: Secondary | ICD-10-CM

## 2014-02-24 DIAGNOSIS — R05 Cough: Secondary | ICD-10-CM

## 2014-02-24 DIAGNOSIS — J45901 Unspecified asthma with (acute) exacerbation: Secondary | ICD-10-CM

## 2014-02-24 DIAGNOSIS — J189 Pneumonia, unspecified organism: Secondary | ICD-10-CM

## 2014-02-24 MED ORDER — ALBUTEROL SULFATE HFA 108 (90 BASE) MCG/ACT IN AERS: 2.0000 | INHALATION_SPRAY | RESPIRATORY_TRACT | Status: DC | PRN

## 2014-02-24 MED ORDER — ALBUTEROL SULFATE (2.5 MG/3ML) 0.083% IN NEBU: 2.5000 mg | INHALATION_SOLUTION | Freq: Four times a day (QID) | RESPIRATORY_TRACT | Status: DC | PRN

## 2014-02-24 MED ORDER — IPRATROPIUM BROMIDE 0.02 % IN SOLN
0.5000 mg | Freq: Once | RESPIRATORY_TRACT | Status: AC
  Administered 2014-02-24: 0.5 mg via RESPIRATORY_TRACT

## 2014-02-24 MED ORDER — LEVOFLOXACIN 500 MG PO TABS: 500.0000 mg | ORAL_TABLET | Freq: Every day | ORAL | Status: AC

## 2014-02-24 MED ORDER — ALBUTEROL SULFATE (2.5 MG/3ML) 0.083% IN NEBU
5.0000 mg | INHALATION_SOLUTION | Freq: Once | RESPIRATORY_TRACT | Status: AC
  Administered 2014-02-24: 5 mg via RESPIRATORY_TRACT

## 2014-02-24 NOTE — Patient Instructions (Signed)

## 2014-02-24 NOTE — Progress Notes (Signed)
[facility] [address], [place] [address] [phone]  Date:  02/24/2014   Name:  Brittany [name]   DOB:  July 29, 1961   MRN:  [id number]  PCP:  No primary provider on file.    Chief Complaint: Cough, Wheezing, Shortness of Breath and Medication Refill   History of Present Illness:  Brittany [name] is a 54 y.o. very pleasant female patient who presents with the following:  Ill for a week with cough and wheezing.  Non productive.  Some shortness of breath with little exertion.  Using nebulized albuterol 2-3 times daily with little improvement. Out of MDI.  Has experienced a fever and chills.  No nausea or vomiting.  No stool change or rash.  No coryza, post nasal drip.  Less appetite.  No improvement with over the counter medications or other home remedies. Denies other complaint or health concern today.   Patient Active Problem List   Diagnosis Date Noted  . HTN (hypertension) 05/07/2012  . Asthma 05/07/2012    Past Medical History  Diagnosis Date  . Hypertension   . Allergy   . Asthma   . Anxiety     No past surgical history on file.  History  Substance Use Topics  . Smoking status: Current Every Day Smoker -- 0.50 packs/day    Types: Cigarettes  . Smokeless tobacco: Not on file  . Alcohol Use: Not on file    Family History  Problem Relation Age of Onset  . Heart disease Mother     Allergies  Allergen Reactions  . Codeine Nausea And Vomiting  . Iodinated Diagnostic Agents Hives and Rash  . Sulfa Antibiotics Hives and Rash    Medication list has been reviewed and updated.  Current Outpatient Prescriptions on File Prior to Visit  Medication Sig Dispense Refill  . atorvastatin (LIPITOR) 20 MG tablet Take 1 tablet (20 mg total) by mouth daily.  90 tablet  0  . metoprolol succinate (TOPROL-XL) 50 MG 24 hr tablet Take 1 tablet (50 mg total) by mouth daily. Take with or immediately following a meal.  30 tablet  3  .  PROVENTIL HFA 108 (90 BASE) MCG/ACT inhaler INHALE 2 PUFFS INTO THE LUNGS EVERY 6 HOURS AS NEEDED FOR SHORTNESS OF BREATH  6.7 each  5  . SYMBICORT 160-4.5 MCG/ACT inhaler INHALE 2 PUFFS INTO THE LUNGS TWICE A DAY  1 Inhaler  4  . albuterol (PROVENTIL) (2.5 MG/3ML) 0.083% nebulizer solution Take 3 mLs (2.5 mg total) by nebulization every 6 (six) hours as needed for wheezing.  150 mL  1  . fluticasone (FLONASE) 50 MCG/ACT nasal spray Place 2 sprays into the nose daily.  16 g  6  . varenicline (CHANTIX CONTINUING MONTH PAK) 1 MG tablet Take 1 tablet (1 mg total) by mouth 2 (two) times daily.  60 tablet  3   No current facility-administered medications on file prior to visit.    Review of Systems:  As per HPI, otherwise negative.    Physical Examination: Filed Vitals:   02/24/14 1524  BP: 138/90  Pulse: 91  Temp: 99.7 F (37.6 C)  Resp: 20   Filed Vitals:   02/24/14 1524  Height: 4' 11.5" (1.511 m)  Weight: 117 lb 6.4 oz (53.252 kg)   Body mass index is 23.32 kg/(m^2). Ideal Body Weight: Weight in (lb) to have BMI = 25: 125.6  GEN: WDWN, NAD, Non-toxic, A & O x 3 HEENT: Atraumatic, Normocephalic. Neck supple.  No masses, No LAD. Ears and Nose: No external deformity. CV: RRR, No M/G/R. No JVD. No thrill. No extra heart sounds. PULM: CTA B, no wheezes, crackles, rhonchi. No retractions. No resp. distress. No accessory muscle use. ABD: S, NT, ND, +BS. No rebound. No HSM. EXTR: No c/c/e NEURO Normal gait.  PSYCH: Normally interactive. Conversant. Not depressed or anxious appearing.  Calm demeanor.    Assessment and Plan: Right pneumonia Use bronchodilator levaquin Follow up chest in 1 month   Signed,  Ellison Carwin, MD   [facility] reading (PRIMARY) by  Dr. Ouida Sills  Pneumonia .

## 2014-02-26 ENCOUNTER — Ambulatory Visit: Payer: BC Managed Care – PPO | Admitting: Internal Medicine

## 2014-02-26 ENCOUNTER — Other Ambulatory Visit: Payer: Self-pay | Admitting: Emergency Medicine

## 2014-02-26 DIAGNOSIS — R918 Other nonspecific abnormal finding of lung field: Secondary | ICD-10-CM

## 2014-03-04 ENCOUNTER — Ambulatory Visit (INDEPENDENT_AMBULATORY_CARE_PROVIDER_SITE_OTHER): Payer: BC Managed Care – PPO | Admitting: Emergency Medicine

## 2014-03-04 ENCOUNTER — Ambulatory Visit: Payer: BC Managed Care – PPO

## 2014-03-04 VITALS — BP 142/80 | HR 89 | Temp 98.0°F | Resp 16 | Ht 59.0 in | Wt 115.0 lb

## 2014-03-04 DIAGNOSIS — Z720 Tobacco use: Secondary | ICD-10-CM

## 2014-03-04 DIAGNOSIS — J9801 Acute bronchospasm: Secondary | ICD-10-CM

## 2014-03-04 DIAGNOSIS — J45901 Unspecified asthma with (acute) exacerbation: Secondary | ICD-10-CM

## 2014-03-04 DIAGNOSIS — R05 Cough: Secondary | ICD-10-CM

## 2014-03-04 DIAGNOSIS — R918 Other nonspecific abnormal finding of lung field: Secondary | ICD-10-CM

## 2014-03-04 DIAGNOSIS — R222 Localized swelling, mass and lump, trunk: Secondary | ICD-10-CM

## 2014-03-04 DIAGNOSIS — F17200 Nicotine dependence, unspecified, uncomplicated: Secondary | ICD-10-CM

## 2014-03-04 DIAGNOSIS — R059 Cough, unspecified: Secondary | ICD-10-CM

## 2014-03-04 MED ORDER — LEVOFLOXACIN 500 MG PO TABS: 500.0000 mg | ORAL_TABLET | Freq: Every day | ORAL | Status: DC

## 2014-03-04 MED ORDER — HYDROCOD POLST-CHLORPHEN POLST 10-8 MG/5ML PO LQCR: 5.0000 mL | Freq: Two times a day (BID) | ORAL | Status: DC | PRN

## 2014-03-04 NOTE — Progress Notes (Signed)
[facility] [address] [place] [address] [phone]  Date:  03/04/2014   Name:  Brittany [name]   DOB:  08-22-1961   MRN:  [id number]  PCP:  No primary provider on file.    Chief Complaint: Follow-up   History of Present Illness:  Brittany [name] is a 53 y.o. very pleasant female patient who presents with the following:  Here last week with cough and treated for pneumonia.  Has persistent cough not affected by cough medicine.  Wheezing likewise is not affected by frequent nebs and aerosol.  No fever or chills.  Last night had lack of energy that caused her to fall to the bathroom floor.  Not sleeping due to shortness of breath.  Poor appetite. No improvement with over the counter medications or other home remedies. Denies other complaint or health concern today.   Patient Active Problem List   Diagnosis Date Noted  . HTN (hypertension) 05/07/2012  . Asthma 05/07/2012    Past Medical History  Diagnosis Date  . Hypertension   . Allergy   . Asthma   . Anxiety     History reviewed. No pertinent past surgical history.  History  Substance Use Topics  . Smoking status: Current Every Day Smoker -- 0.50 packs/day    Types: Cigarettes  . Smokeless tobacco: Not on file  . Alcohol Use: Not on file    Family History  Problem Relation Age of Onset  . Heart disease Mother     Allergies  Allergen Reactions  . Codeine Nausea And Vomiting  . Iodinated Diagnostic Agents Hives and Rash  . Sulfa Antibiotics Hives and Rash    Medication list has been reviewed and updated.  Current Outpatient Prescriptions on File Prior to Visit  Medication Sig Dispense Refill  . albuterol (PROVENTIL HFA) 108 (90 BASE) MCG/ACT inhaler Inhale 2 puffs into the lungs every 4 (four) hours as needed for wheezing or shortness of breath.  6.7 each  5  . albuterol (PROVENTIL) (2.5 MG/3ML) 0.083% nebulizer solution Take 3 mLs (2.5 mg total) by nebulization  every 6 (six) hours as needed for wheezing.  150 mL  1  . atorvastatin (LIPITOR) 20 MG tablet Take 1 tablet (20 mg total) by mouth daily.  90 tablet  0  . levofloxacin (LEVAQUIN) 500 MG tablet Take 1 tablet (500 mg total) by mouth daily.  10 tablet  0  . metoprolol succinate (TOPROL-XL) 50 MG 24 hr tablet Take 1 tablet (50 mg total) by mouth daily. Take with or immediately following a meal.  30 tablet  3  . SYMBICORT 160-4.5 MCG/ACT inhaler INHALE 2 PUFFS INTO THE LUNGS TWICE A DAY  1 Inhaler  4  . fluticasone (FLONASE) 50 MCG/ACT nasal spray Place 2 sprays into the nose daily.  16 g  6  . varenicline (CHANTIX CONTINUING MONTH PAK) 1 MG tablet Take 1 tablet (1 mg total) by mouth 2 (two) times daily.  60 tablet  3   No current facility-administered medications on file prior to visit.    Review of Systems:  As per HPI, otherwise negative.    Physical Examination: Filed Vitals:   03/04/14 1240  BP: 142/80  Pulse: 89  Temp: 98 F (36.7 C)  Resp: 16   Filed Vitals:   03/04/14 1240  Height: 4\' 11"  (1.499 m)  Weight: 115 lb (52.164 kg)   Body mass index is 23.21 kg/(m^2). Ideal Body Weight: Weight in (lb) to have BMI =  25: 123.5  GEN: WDWN, NAD, Non-toxic, A & O x 3   SAO2  96 on room air HEENT: Atraumatic, Normocephalic. Neck supple. No masses, No LAD. Ears and Nose: No external deformity. CV: RRR, No M/G/R. No JVD. No thrill. No extra heart sounds. PULM: CTA B, no wheezes, crackles, rhonchi. No retractions. No resp. distress. No accessory muscle use. ABD: S, NT, ND, +BS. No rebound. No HSM. EXTR: No c/c/e NEURO Normal gait.  PSYCH: Normally interactive. Conversant. Not depressed or anxious appearing.  Calm demeanor.    Assessment and Plan: Persistent cough Pneumonia resolving Right hilar mass and atelectasis Pending CT STOP smoking   Signed,  Ellison Carwin, MD  [facility] reading (PRIMARY) by  Dr. Ouida Sills.  Pneumonia improving still has atelectasis and hilar  mass.

## 2014-03-04 NOTE — Patient Instructions (Signed)

## 2014-03-05 ENCOUNTER — Emergency Department (HOSPITAL_COMMUNITY): Payer: BC Managed Care – PPO

## 2014-03-05 ENCOUNTER — Encounter (HOSPITAL_COMMUNITY): Payer: Self-pay | Admitting: Emergency Medicine

## 2014-03-05 ENCOUNTER — Emergency Department (HOSPITAL_COMMUNITY)
Admission: EM | Admit: 2014-03-05 | Discharge: 2014-03-05 | Disposition: A | Discharge status: Home / Self Care | Payer: BC Managed Care – PPO | Attending: Emergency Medicine | Admitting: Emergency Medicine

## 2014-03-05 DIAGNOSIS — F17200 Nicotine dependence, unspecified, uncomplicated: Secondary | ICD-10-CM | POA: Insufficient documentation

## 2014-03-05 DIAGNOSIS — I10 Essential (primary) hypertension: Secondary | ICD-10-CM | POA: Insufficient documentation

## 2014-03-05 DIAGNOSIS — J45901 Unspecified asthma with (acute) exacerbation: Secondary | ICD-10-CM | POA: Insufficient documentation

## 2014-03-05 DIAGNOSIS — R222 Localized swelling, mass and lump, trunk: Secondary | ICD-10-CM | POA: Insufficient documentation

## 2014-03-05 DIAGNOSIS — IMO0002 Reserved for concepts with insufficient information to code with codable children: Secondary | ICD-10-CM | POA: Insufficient documentation

## 2014-03-05 DIAGNOSIS — R918 Other nonspecific abnormal finding of lung field: Secondary | ICD-10-CM

## 2014-03-05 DIAGNOSIS — Z79899 Other long term (current) drug therapy: Secondary | ICD-10-CM | POA: Insufficient documentation

## 2014-03-05 DIAGNOSIS — R61 Generalized hyperhidrosis: Secondary | ICD-10-CM | POA: Insufficient documentation

## 2014-03-05 DIAGNOSIS — J189 Pneumonia, unspecified organism: Secondary | ICD-10-CM | POA: Insufficient documentation

## 2014-03-05 DIAGNOSIS — Z8659 Personal history of other mental and behavioral disorders: Secondary | ICD-10-CM | POA: Insufficient documentation

## 2014-03-05 LAB — COMPREHENSIVE METABOLIC PANEL
ALT: 21 U/L (ref 0–35)
AST: 24 U/L (ref 0–37)
Albumin: 3.9 g/dL (ref 3.5–5.2)
Alkaline Phosphatase: 114 U/L (ref 39–117)
BILIRUBIN TOTAL: 0.7 mg/dL (ref 0.3–1.2)
BUN: 11 mg/dL (ref 6–23)
CHLORIDE: 98 meq/L (ref 96–112)
CO2: 28 meq/L (ref 19–32)
Calcium: 9.8 mg/dL (ref 8.4–10.5)
Creatinine, Ser: 0.65 mg/dL (ref 0.50–1.10)
GLUCOSE: 93 mg/dL (ref 70–99)
Potassium: 3.4 mEq/L — ABNORMAL LOW (ref 3.7–5.3)
Sodium: 142 mEq/L (ref 137–147)
Total Protein: 7.8 g/dL (ref 6.0–8.3)

## 2014-03-05 LAB — I-STAT CHEM 8, ED
BUN: 11 mg/dL (ref 6–23)
CALCIUM ION: 1.11 mmol/L — AB (ref 1.12–1.23)
CHLORIDE: 99 meq/L (ref 96–112)
Creatinine, Ser: 0.8 mg/dL (ref 0.50–1.10)
GLUCOSE: 97 mg/dL (ref 70–99)
HCT: 63 % — ABNORMAL HIGH (ref 36.0–46.0)
Hemoglobin: 21.4 g/dL (ref 12.0–15.0)
Potassium: 3.2 mEq/L — ABNORMAL LOW (ref 3.7–5.3)
Sodium: 143 mEq/L (ref 137–147)
TCO2: 30 mmol/L (ref 0–100)

## 2014-03-05 LAB — CBC
HCT: 54.1 % — ABNORMAL HIGH (ref 36.0–46.0)
Hemoglobin: 18.7 g/dL — ABNORMAL HIGH (ref 12.0–15.0)
MCH: 34.4 pg — ABNORMAL HIGH (ref 26.0–34.0)
MCHC: 34.6 g/dL (ref 30.0–36.0)
MCV: 99.4 fL (ref 78.0–100.0)
PLATELETS: 265 10*3/uL (ref 150–400)
RBC: 5.44 MIL/uL — ABNORMAL HIGH (ref 3.87–5.11)
RDW: 22.2 % — AB (ref 11.5–15.5)
WBC: 10.7 10*3/uL — ABNORMAL HIGH (ref 4.0–10.5)

## 2014-03-05 LAB — I-STAT TROPONIN, ED: Troponin i, poc: 0 ng/mL (ref 0.00–0.08)

## 2014-03-05 LAB — I-STAT CG4 LACTIC ACID, ED: Lactic Acid, Venous: 2.31 mmol/L — ABNORMAL HIGH (ref 0.5–2.2)

## 2014-03-05 MED ORDER — IOHEXOL 350 MG/ML SOLN
80.0000 mL | Freq: Once | INTRAVENOUS | Status: AC | PRN
  Administered 2014-03-05: 80 mL via INTRAVENOUS

## 2014-03-05 MED ORDER — IPRATROPIUM-ALBUTEROL 0.5-2.5 (3) MG/3ML IN SOLN
3.0000 mL | Freq: Once | RESPIRATORY_TRACT | Status: AC
  Administered 2014-03-05: 3 mL via RESPIRATORY_TRACT
  Filled 2014-03-05: qty 3

## 2014-03-05 MED ORDER — METHYLPREDNISOLONE SODIUM SUCC 125 MG IJ SOLR
125.0000 mg | Freq: Once | INTRAMUSCULAR | Status: AC
  Administered 2014-03-05: 125 mg via INTRAVENOUS
  Filled 2014-03-05: qty 2

## 2014-03-05 MED ORDER — DEXTROSE 5 % IV SOLN: 1.0000 g | Freq: Once | INTRAVENOUS | Status: DC

## 2014-03-05 MED ORDER — DEXTROSE 5 % IV SOLN: 500.0000 mg | Freq: Once | INTRAVENOUS | Status: DC

## 2014-03-05 MED ORDER — POTASSIUM CHLORIDE CRYS ER 20 MEQ PO TBCR
40.0000 meq | EXTENDED_RELEASE_TABLET | Freq: Once | ORAL | Status: AC
  Administered 2014-03-05: 40 meq via ORAL
  Filled 2014-03-05: qty 2

## 2014-03-05 MED ORDER — ALBUTEROL SULFATE HFA 108 (90 BASE) MCG/ACT IN AERS
2.0000 | INHALATION_SPRAY | Freq: Once | RESPIRATORY_TRACT | Status: AC
  Administered 2014-03-05: 2 via RESPIRATORY_TRACT
  Filled 2014-03-05: qty 6.7

## 2014-03-05 MED ORDER — SODIUM CHLORIDE 0.9 % IV BOLUS (SEPSIS)
1000.0000 mL | Freq: Once | INTRAVENOUS | Status: AC
  Administered 2014-03-05: 1000 mL via INTRAVENOUS

## 2014-03-05 MED ORDER — DIPHENHYDRAMINE HCL 50 MG/ML IJ SOLN
25.0000 mg | Freq: Once | INTRAMUSCULAR | Status: AC
  Administered 2014-03-05: 25 mg via INTRAVENOUS
  Filled 2014-03-05: qty 1

## 2014-03-05 MED ORDER — LEVOFLOXACIN IN D5W 750 MG/150ML IV SOLN
750.0000 mg | Freq: Once | INTRAVENOUS | Status: AC
  Administered 2014-03-05: 750 mg via INTRAVENOUS
  Filled 2014-03-05: qty 150

## 2014-03-05 NOTE — ED Notes (Signed)
Pt 90% on RA, placed on 2L [place] and 02 increased to 100%.

## 2014-03-05 NOTE — ED Notes (Signed)
Pt tearful and upset; speaking with family members on phone. Pt offered chaplain services. Comfort measures.

## 2014-03-05 NOTE — ED Notes (Signed)
Pt's HR ranging from 50-116, irr. sinus rhythm with PAC's. Md Darl Householder made aware. Pt reports decrease in SOB after finishing breathing tx.

## 2014-03-05 NOTE — ED Notes (Signed)
Contacted lab about wait time  CMP

## 2014-03-05 NOTE — ED Provider Notes (Signed)
CSN: [id number]     Arrival date & time 03/05/14  1140 History   First MD Initiated Contact with Patient 03/05/14 1204     Chief Complaint  Patient presents with  . Shortness of Breath  . Chest Pain     (Consider location/radiation/quality/duration/timing/severity/associated sxs/prior Treatment) The history is provided by the patient.  Brittany [name] is a 53 y.o. female smoker history of hypertension, asthma here presenting with shortness of breath and chest tightness. He has been coughing for several weeks and was diagnosed with pneumonia 10 days ago. She finished seven-day course of Levaquin but repeat chest x-ray still showed ammonia so she is on a second course of Levaquin. The last couple days she has intermittent coughing with chest tightness. She also has some diaphoresis associated with it. She has no diagnosed COPD but is a smoker but was unable to smoke because she is so short of breath. She went to the doctors office and had another coughing spell so was sent in. Given 1 duoneb, 4mg  zofran. O2 was 94% on RA as per EMS.    Past Medical History  Diagnosis Date  . Hypertension   . Allergy   . Asthma   . Anxiety    No past surgical history on file. Family History  Problem Relation Age of Onset  . Heart disease Mother    History  Substance Use Topics  . Smoking status: Current Every Day Smoker -- 0.50 packs/day    Types: Cigarettes  . Smokeless tobacco: Not on file  . Alcohol Use: 4.8 oz/week    8 Glasses of wine per week   OB History   Grav Para Term Preterm Abortions TAB SAB Ect Mult Living                 Review of Systems  Respiratory: Positive for shortness of breath.   Cardiovascular: Positive for chest pain.  All other systems reviewed and are negative.      Allergies  Codeine; Iodinated diagnostic agents; and Sulfa antibiotics  Home Medications   Current Outpatient Rx  Name  Route  Sig  Dispense  Refill  . albuterol (PROVENTIL HFA) 108  (90 BASE) MCG/ACT inhaler   Inhalation   Inhale 2 puffs into the lungs every 4 (four) hours as needed for wheezing or shortness of breath.   6.7 each   5   . albuterol (PROVENTIL) (2.5 MG/3ML) 0.083% nebulizer solution   Nebulization   Take 3 mLs (2.5 mg total) by nebulization every 6 (six) hours as needed for wheezing.   150 mL   1   . atorvastatin (LIPITOR) 20 MG tablet   Oral   Take 1 tablet (20 mg total) by mouth daily.   90 tablet   0   . budesonide-formoterol (SYMBICORT) 160-4.5 MCG/ACT inhaler   Inhalation   Inhale 2 puffs into the lungs 2 (two) times daily.         . chlorpheniramine-HYDROcodone (TUSSIONEX PENNKINETIC ER) 10-8 MG/5ML LQCR   Oral   Take 5 mLs by mouth every 12 (twelve) hours as needed.   60 mL   0   . fluticasone (FLONASE) 50 MCG/ACT nasal spray   Each Nare   Place 2 sprays into both nostrils daily.         Marland Kitchen levofloxacin (LEVAQUIN) 500 MG tablet   Oral   Take 1 tablet (500 mg total) by mouth daily.   10 tablet   0   . lisinopril (PRINIVIL,ZESTRIL) 10 MG  tablet   Oral   Take 10 mg by mouth daily.         . metoprolol succinate (TOPROL-XL) 50 MG 24 hr tablet   Oral   Take 1 tablet (50 mg total) by mouth daily. Take with or immediately following a meal.   30 tablet   3   . levofloxacin (LEVAQUIN) 500 MG tablet   Oral   Take 1 tablet (500 mg total) by mouth daily.   5 tablet   0    BP 154/93  Pulse 96  Temp(Src) 98.1 F (36.7 C) (Oral)  Resp 17  Ht 4' 11.5" (1.511 m)  Wt 117 lb (53.071 kg)  BMI 23.24 kg/m2  SpO2 95% Physical Exam  Nursing note and vitals reviewed. Constitutional: She is oriented to person, place, and time.  Slightly tachypneic, talking in full sentences   HENT:  Head: Normocephalic.  Mouth/Throat: Oropharynx is clear and moist.  Eyes: Conjunctivae and EOM are normal. Pupils are equal, round, and reactive to light.  Neck: Normal range of motion. Neck supple.  Cardiovascular: Normal rate, regular rhythm  and normal heart sounds.   Pulmonary/Chest:  Tachypneic, + wheezing more on R side. Dec breath sound L side. No crackles   Abdominal: Soft. Bowel sounds are normal. She exhibits no distension. There is no tenderness. There is no rebound.  Musculoskeletal: Normal range of motion. She exhibits no edema and no tenderness.  Neurological: She is alert and oriented to person, place, and time. No cranial nerve deficit. Coordination normal.  Skin: Skin is warm and dry.  Psychiatric: She has a normal mood and affect. Her behavior is normal. Judgment and thought content normal.    ED Course  Procedures (including critical care time) Labs Review Labs Reviewed  CBC - Abnormal; Notable for the following:    WBC 10.7 (*)    RBC 5.44 (*)    Hemoglobin 18.7 (*)    HCT 54.1 (*)    MCH 34.4 (*)    RDW 22.2 (*)    All other components within normal limits  COMPREHENSIVE METABOLIC PANEL - Abnormal; Notable for the following:    Potassium 3.4 (*)    All other components within normal limits  I-STAT CG4 LACTIC ACID, ED - Abnormal; Notable for the following:    Lactic Acid, Venous 2.31 (*)    All other components within normal limits  I-STAT CHEM 8, ED - Abnormal; Notable for the following:    Potassium 3.2 (*)    Calcium, Ion 1.11 (*)    Hemoglobin 21.4 (*)    HCT 63.0 (*)    All other components within normal limits  I-STAT TROPOININ, ED   Imaging Review Dg Chest 2 View  03/05/2014   CLINICAL DATA:  Shortness of breath, chest pain  EXAM: CHEST  2 VIEW  COMPARISON:  March 04, 2014  FINDINGS: The previously noted right perihilar mass with associated postobstructive bronchopneumonia is unchanged. There is no pleural effusion. There is no pulmonary edema. The mediastinal contour and cardiac silhouette are stable. The soft tissues and osseous structures are normal.  IMPRESSION: Previously noted right perihilar mass with associated postobstructive bronchopneumonia is unchanged.   Electronically Signed    By: Abelardo Diesel M.D.   On: 03/05/2014 12:18   Dg Chest 2 View  03/04/2014   CLINICAL DATA:  Shortness of breath, history of smoking  EXAM: CHEST  2 VIEW  COMPARISON:  DG CHEST 2V dated 02/24/2014; DG CHEST 2 VIEW dated 11/22/2011;  DG CHEST 2 VIEW dated 05/25/2008  FINDINGS: Grossly unchanged cardiac silhouette and mediastinal contours with similar appearance of previously identified large right-sided perihilar mass with associated heterogeneous opacities radiating peripherally along the right major and minor fissures. No new focal airspace opacities. No pleural effusion or pneumothorax. No evidence of edema.  IMPRESSION: Grossly unchanged appearance of previously identified large right-sided perihilar mass with associated postobstructive infection. These findings are again worrisome for bronchogenic carcinoma and further evaluation with chest CT is again recommended.  These results will be called to the ordering clinician or representative by the Radiologist Assistant, and communication documented in the PACS Dashboard.   Electronically Signed   By: Sandi Mariscal M.D.   On: 03/04/2014 15:15   Ct Angio Chest Pe W/cm &/or Wo Cm  03/05/2014   CLINICAL DATA:  Shortness of breath.  EXAM: CT ANGIOGRAPHY CHEST WITH CONTRAST  TECHNIQUE: Multidetector CT imaging of the chest was performed using the standard protocol during bolus administration of intravenous contrast. Multiplanar CT image reconstructions and MIPs were obtained to evaluate the vascular anatomy.  CONTRAST:  36mL OMNIPAQUE IOHEXOL 350 MG/ML SOLN  COMPARISON:  DG CHEST 2 VIEW dated 03/05/2014; DG CHEST 2V dated 03/04/2014; DG CHEST 2V dated 02/24/2014  FINDINGS: Thoracic aorta unremarkable. No evidence of pulmonary embolus. There is narrowing of the right pulmonary artery secondary to large mediastinal and hilar mass. Cardiomegaly. Small pericardial effusion.  Extensive mediastinal and hilar adenopathy and/or mass lesion is present. This results in narrowing of  the right pulmonary artery and near complete occlusion of the right bronchus. The mass extends into the subcarinal region and into the left hilum. Associated left hilar adenopathy is most likely present. The mass conglomerate measures up to 8.1 x 6.5 x 7.3 cm. Visualized portions of the thoracic esophagus unremarkable.  Trachea and left mainstem bronchus are patent. Again there is near-complete occlusion of the right mainstem bronchus. Infiltrative changes are present in the right middle and right lower lobes. These changes are most likely related to postobstructive pneumonitis. No pneumothorax. No significant pleural effusion.  Adrenals normal.  Visualized upper abdominal viscera unremarkable.  Punctate lucency in the right thyroid lobe, most likely tiny cysts. Shotty bilateral axillary lymph nodes. Breast tissue is dense with asymmetric densities for which further evaluation with mammography suggested. No focal bony abnormalities identified.  Review of the MIP images confirms the above findings.  IMPRESSION: 1. Large mediastinal and right hilar mass lesions with narrowing of the right pulmonary artery and near complete occlusion of the right mainstem bronchus. These changes are most likely malignant and related to a process such as primary lung cancer with metastatic lymphadenopathy, metastatic adenopathy from other primary, or lymphoma. PET-CT can be obtained for further evaluation. 2. No evidence of pulmonary embolus. Again the right pulmonary artery is narrowed secondary to the above described mass lesion. 3. Postobstructive right middle lobe and right lower lobe pneumonia. Again the right mainstem bronchus is almost completely occluded by the above-described mass. 4. Breasts are bilaterally dense with asymmetric densities, further evaluation with mammography suggested. 5. Cardiomegaly with small pericardial effusion.   Electronically Signed   By: Marcello Moores  Register   On: 03/05/2014 15:53     EKG  Interpretation   Date/Time:  Thursday March 05 2014 11:38:46 EDT Ventricular Rate:  74 PR Interval:  144 QRS Duration: 92 QT Interval:  444 QTC Calculation: 493 R Axis:   -164 Text Interpretation:  Sinus rhythm Consider right atrial enlargement Right  axis deviation Borderline prolonged  QT interval nonspecific ST changes   since previous  Confirmed by YAO  MD, DAVID ([id number]) on 03/05/2014 12:03:33  PM      MDM   Final diagnoses:  None   Elaysha Bevard is a 53 y.o. female here with SOB. Consider COPD exacerbation. CXR showed lung mass with post obstructive pneumonia. Will get ct to r/o PE given lung mass and tachypnea. Will reassess.   5:02 PM CT showed no PE, just mediastinal mass and post obstructive pneumonia. Never hypoxic in the ED. I called Dr. Alen Blew from oncology, who recommend CT surgery for biopsy. I called Dr. Roxy Manns, who recommend f/u in the office. The office will call her to arrange for biopsy. She should finish Levaquin. Will give albuterol for symptomatic relief.   Wandra Arthurs, MD 03/05/14 [phone]

## 2014-03-05 NOTE — ED Notes (Signed)
RN Miquel Dunn notified of chem 8 results

## 2014-03-05 NOTE — Discharge Instructions (Signed)
Dr. Guy Sandifer office will call you to arrange for biopsy.   Finish levaquin. You are likely still be short of breath until the mass is taken care of.   Use albuterol every 4 hrs as needed for shortness of breath.   You need to see the oncologist. Call Dr. Hazeline Junker office to arrange for follow up.   Return to ER if you have trouble breathing, chest pain.

## 2014-03-05 NOTE — ED Notes (Signed)
MdYao at bedside discussing results

## 2014-03-05 NOTE — ED Notes (Signed)
Per EMS: Pt from work w/ c/o increased SOB, diaphoresis, N/V and non-radiating centralized chest "tightness". Pt 94% on RA, but rales and wheezing noted bilaterally. Given 5mg  albuterol,1 duoneb, 4mg  zofran.  NSR 80. 148/68. Pt dx with pneumonia 3/17, started on abx.

## 2014-03-05 NOTE — ED Notes (Signed)
Two blood samples of CMP have been hemolyzed per lab. Md Darl Householder made aware. Phlebotomist made aware for need of redraw

## 2014-03-06 ENCOUNTER — Other Ambulatory Visit: Payer: BC Managed Care – PPO

## 2014-03-10 ENCOUNTER — Encounter: Payer: Self-pay | Admitting: Thoracic Surgery (Cardiothoracic Vascular Surgery)

## 2014-03-10 ENCOUNTER — Institutional Professional Consult (permissible substitution) (INDEPENDENT_AMBULATORY_CARE_PROVIDER_SITE_OTHER): Payer: BC Managed Care – PPO | Admitting: Thoracic Surgery (Cardiothoracic Vascular Surgery)

## 2014-03-10 ENCOUNTER — Other Ambulatory Visit: Payer: Self-pay | Admitting: *Deleted

## 2014-03-10 VITALS — BP 156/96 | HR 104 | Resp 20 | Ht 59.5 in | Wt 117.0 lb

## 2014-03-10 DIAGNOSIS — J9859 Other diseases of mediastinum, not elsewhere classified: Secondary | ICD-10-CM

## 2014-03-10 DIAGNOSIS — R918 Other nonspecific abnormal finding of lung field: Secondary | ICD-10-CM

## 2014-03-10 DIAGNOSIS — R222 Localized swelling, mass and lump, trunk: Secondary | ICD-10-CM

## 2014-03-10 MED ORDER — CHLORHEXIDINE GLUCONATE CLOTH 2 % EX PADS: 6.0000 | MEDICATED_PAD | Freq: Once | CUTANEOUS | Status: DC

## 2014-03-10 MED ORDER — DEXTROSE 5 % IV SOLN: 1.5000 g | INTRAVENOUS | Status: DC

## 2014-03-10 MED ORDER — DEXTROSE 5 % IV SOLN
1.5000 g | INTRAVENOUS | Status: AC
  Administered 2014-03-11: 1.5 g via INTRAVENOUS
  Filled 2014-03-10: qty 1.5

## 2014-03-10 NOTE — Progress Notes (Signed)
PCP is No primary provider on file. Referring Provider is Wandra Arthurs, MD  Chief Complaint  Patient presents with  . Lung Mass    Surgical eval on Lung Mass, Chest CTA 03/06/14    HPI: Brittany [name] is a 53 year old woman sent for consultation regarding a right hilar and mediastinal mass.  Brittany [name] 64-year-old woman with a history of hypertension, asthma, and 20+-pack-year history of tobacco abuse. She is currently smoking about one half a pack of cigarettes a day.  She has been losing weight over the past 6 months, but otherwise had been feeling well up until about 2 weeks ago. She had a cough for a few weeks, however that cough worsened and she began experiencing some chest tightness and shortness of breath. Her inhalers did not seem to help. She went to urgent care and a chest x-ray was done which showed a possible right hilar mass and pneumonia. She was treated with Levaquin, with a plan to repeat her chest x-ray and a later time.  She completed her course of Levaquin but was continuing to have worsening symptoms. She was given a second course of Levaquin. She continued to have a persistent cough was having worsening shortness of breath and chest tightness. A repeat chest x-ray again suggested a right hilar mass. A CT of the chest was done on March 26, which showed a large right hilar and mediastinal mass.  She has had a persisting cough. It is productive of yellowish sputum. She has not had any hemoptysis. She has lost 19 pounds over the past 6 months and 12 pounds over the past 3 months, most of that was in the last 2 weeks. Her appetite is poor and she has decreased energy, she attributes these symptoms to her pneumonia. She says that she has a sensation of fullness and tightness in her chest and feels like she cannot get a deep breath. She is constantly wheezing.  ZUBROD score = 1  Past Medical History  Diagnosis Date  . Hypertension   . Allergy   . Asthma   .  Anxiety     Past Surgical History  Procedure Laterality Date  . Tubal ligation    . Cesarean section      Family History  Problem Relation Age of Onset  . Heart disease Mother   . Hypertension Mother     Social History History  Substance Use Topics  . Smoking status: Current Every Day Smoker -- 1.00 packs/day for 20 years    Types: Cigarettes  . Smokeless tobacco: Not on file  . Alcohol Use: 4.8 oz/week    8 Glasses of wine per week    Current Outpatient Prescriptions  Medication Sig Dispense Refill  . albuterol (PROVENTIL HFA) 108 (90 BASE) MCG/ACT inhaler Inhale 2 puffs into the lungs every 4 (four) hours as needed for wheezing or shortness of breath.  6.7 each  5  . albuterol (PROVENTIL) (2.5 MG/3ML) 0.083% nebulizer solution Take 3 mLs (2.5 mg total) by nebulization every 6 (six) hours as needed for wheezing.  150 mL  1  . atorvastatin (LIPITOR) 20 MG tablet Take 1 tablet (20 mg total) by mouth daily.  90 tablet  0  . budesonide-formoterol (SYMBICORT) 160-4.5 MCG/ACT inhaler Inhale 2 puffs into the lungs 2 (two) times daily.      . chlorpheniramine-HYDROcodone (TUSSIONEX PENNKINETIC ER) 10-8 MG/5ML LQCR Take 5 mLs by mouth every 12 (twelve) hours as needed.  60 mL  0  .  fluticasone (FLONASE) 50 MCG/ACT nasal spray Place 2 sprays into both nostrils daily.      Marland Kitchen levofloxacin (LEVAQUIN) 500 MG tablet Take 1 tablet (500 mg total) by mouth daily.  5 tablet  0  . lisinopril (PRINIVIL,ZESTRIL) 10 MG tablet Take 10 mg by mouth daily.      . metoprolol succinate (TOPROL-XL) 50 MG 24 hr tablet Take 1 tablet (50 mg total) by mouth daily. Take with or immediately following a meal.  30 tablet  3   No current facility-administered medications for this visit.    Allergies  Allergen Reactions  . Codeine Nausea And Vomiting  . Iodinated Diagnostic Agents Hives and Rash  . Sulfa Antibiotics Hives and Rash    Review of Systems  Constitutional: Positive for fever, appetite change,  fatigue and unexpected weight change (Lost 20 pounds in 6 months and 12 pounds in last 3 months).  Respiratory: Positive for cough (No hemoptysis), chest tightness, shortness of breath, wheezing and stridor.   Cardiovascular: Positive for chest pain and leg swelling (when blood pressure elevated).  All other systems reviewed and are negative.    BP 156/96  Pulse 104  Resp 20  Ht 4' 11.5" (1.511 m)  Wt 117 lb (53.071 kg)  BMI 23.24 kg/m2  SpO2 94% Physical Exam  Vitals reviewed. Constitutional: She is oriented to person, place, and time. She appears well-developed and well-nourished. No distress.  HENT:  Head: Normocephalic and atraumatic.  Eyes: EOM are normal. Pupils are equal, round, and reactive to light.  Neck: Neck supple. No JVD present. No thyromegaly present.  Cardiovascular: Normal rate, regular rhythm, normal heart sounds and intact distal pulses.   No murmur heard. Pulmonary/Chest:  Mild stridor and wheezing on right, transmitted to the left  Abdominal: Soft. There is no tenderness.  Musculoskeletal: She exhibits no edema.  Lymphadenopathy:    She has no cervical adenopathy.  Neurological: She is alert and oriented to person, place, and time. No cranial nerve deficit.  No focal motor deficit  Skin: Skin is warm and dry.     Diagnostic Tests: CT ANGIOGRAPHY CHEST WITH CONTRAST  TECHNIQUE:  Multidetector CT imaging of the chest was performed using the  standard protocol during bolus administration of intravenous  contrast. Multiplanar CT image reconstructions and MIPs were  obtained to evaluate the vascular anatomy.  CONTRAST: 60mL OMNIPAQUE IOHEXOL 350 MG/ML SOLN  COMPARISON: DG CHEST 2 VIEW dated 03/05/2014; DG CHEST 2V dated  03/04/2014; DG CHEST 2V dated 02/24/2014  FINDINGS:  Thoracic aorta unremarkable. No evidence of pulmonary embolus. There  is narrowing of the right pulmonary artery secondary to large  mediastinal and hilar mass. Cardiomegaly. Small  pericardial  effusion.  Extensive mediastinal and hilar adenopathy and/or mass lesion is  present. This results in narrowing of the right pulmonary artery and  near complete occlusion of the right bronchus. The mass extends into  the subcarinal region and into the left hilum. Associated left hilar  adenopathy is most likely present. The mass conglomerate measures up  to 8.1 x 6.5 x 7.3 cm. Visualized portions of the thoracic esophagus  unremarkable.  Trachea and left mainstem bronchus are patent. Again there is  near-complete occlusion of the right mainstem bronchus. Infiltrative  changes are present in the right middle and right lower lobes. These  changes are most likely related to postobstructive pneumonitis. No  pneumothorax. No significant pleural effusion.  Adrenals normal. Visualized upper abdominal viscera unremarkable.  Punctate lucency in the right thyroid  lobe, most likely tiny cysts.  Shotty bilateral axillary lymph nodes. Breast tissue is dense with  asymmetric densities for which further evaluation with mammography  suggested. No focal bony abnormalities identified.  Review of the MIP images confirms the above findings.  IMPRESSION:  1. Large mediastinal and right hilar mass lesions with narrowing of  the right pulmonary artery and near complete occlusion of the right  mainstem bronchus. These changes are most likely malignant and  related to a process such as primary lung cancer with metastatic  lymphadenopathy, metastatic adenopathy from other primary, or  lymphoma. PET-CT can be obtained for further evaluation.  2. No evidence of pulmonary embolus. Again the right pulmonary  artery is narrowed secondary to the above described mass lesion.  3. Postobstructive right middle lobe and right lower lobe pneumonia.  Again the right mainstem bronchus is almost completely occluded by  the above-described mass.  4. Breasts are bilaterally dense with asymmetric densities, further   evaluation with mammography suggested.  5. Cardiomegaly with small pericardial effusion.  Electronically Signed  By: Marcello Moores Register  On: 03/05/2014 15:53   Impression: 53 year old woman with a history of tobacco abuse who presents with progressive cough and dyspnea. She also has been having problems with her appetite and has had significant weight loss. Workup reveals a large right hilar and mediastinal mass with near complete occlusion of the right mainstem along with narrowing of the right pulmonary artery. This is almost certainly a malignancy. 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 She needs a biopsy urgently so that appropriate treatment can be initiated.  I recommended to the patient that we proceed with bronchoscopy, possible EBUS, possible mediastinoscopy in the operating room tomorrow. I described the procedure to her in detail. We will plan to do this under general anesthesia. We will perform bronchoscopy first. If we are able to establish a diagnosis then we would not need to go further. If no diagnosis with bronchoscopy we will plan to go ahead with EBUS, and likewise if that is unrevealing we'll proceed with mediastinoscopy at the same setting. It is imperative that we get a diagnosis as soon as possible.  I discussed with her the nature of the procedure including the need for general anesthesia, the incisions to be used if necessary, and the expected hospital stay if needed. She understands that this is a diagnostic and not a therapeutic procedure. We discussed the indications, risks, benefits, and alternatives. She understands risks include those associated with general anesthesia. The risks also include bleeding, pneumothorax, in addition to generalized risks such as wound infection, MI, DVT, PE. She does understand there is a possibility of unforeseeable complications.  She accepts the risks of surgery and wishes  to proceed  Pla Bronchoscopy, possible EBUS, possible mediastinoscopy tomorrow afternoon

## 2014-03-11 ENCOUNTER — Ambulatory Visit (HOSPITAL_COMMUNITY): Payer: BC Managed Care – PPO | Admitting: Certified Registered Nurse Anesthetist

## 2014-03-11 ENCOUNTER — Ambulatory Visit (HOSPITAL_COMMUNITY): Payer: BC Managed Care – PPO

## 2014-03-11 ENCOUNTER — Encounter (HOSPITAL_COMMUNITY): Payer: Self-pay | Admitting: *Deleted

## 2014-03-11 ENCOUNTER — Inpatient Hospital Stay (HOSPITAL_COMMUNITY): Payer: BC Managed Care – PPO | Admitting: Certified Registered Nurse Anesthetist

## 2014-03-11 ENCOUNTER — Encounter (HOSPITAL_COMMUNITY)
Admission: RE | Disposition: A | Discharge status: Home / Self Care | Payer: Self-pay | Source: Ambulatory Visit | Attending: Thoracic Surgery (Cardiothoracic Vascular Surgery)

## 2014-03-11 ENCOUNTER — Other Ambulatory Visit: Payer: Self-pay

## 2014-03-11 ENCOUNTER — Inpatient Hospital Stay (HOSPITAL_COMMUNITY)
Admission: RE | Admit: 2014-03-11 | Discharge: 2014-03-14 | DRG: 180 | Disposition: A | Discharge status: Home / Self Care | Payer: BC Managed Care – PPO | Source: Ambulatory Visit | Attending: Family Medicine | Admitting: Family Medicine

## 2014-03-11 DIAGNOSIS — I498 Other specified cardiac arrhythmias: Secondary | ICD-10-CM | POA: Diagnosis not present

## 2014-03-11 DIAGNOSIS — E43 Unspecified severe protein-calorie malnutrition: Secondary | ICD-10-CM | POA: Insufficient documentation

## 2014-03-11 DIAGNOSIS — R634 Abnormal weight loss: Secondary | ICD-10-CM | POA: Diagnosis present

## 2014-03-11 DIAGNOSIS — F411 Generalized anxiety disorder: Secondary | ICD-10-CM | POA: Diagnosis present

## 2014-03-11 DIAGNOSIS — J9859 Other diseases of mediastinum, not elsewhere classified: Secondary | ICD-10-CM

## 2014-03-11 DIAGNOSIS — IMO0002 Reserved for concepts with insufficient information to code with codable children: Secondary | ICD-10-CM

## 2014-03-11 DIAGNOSIS — R222 Localized swelling, mass and lump, trunk: Secondary | ICD-10-CM

## 2014-03-11 DIAGNOSIS — F17200 Nicotine dependence, unspecified, uncomplicated: Secondary | ICD-10-CM | POA: Diagnosis present

## 2014-03-11 DIAGNOSIS — I480 Paroxysmal atrial fibrillation: Secondary | ICD-10-CM | POA: Diagnosis present

## 2014-03-11 DIAGNOSIS — R918 Other nonspecific abnormal finding of lung field: Secondary | ICD-10-CM | POA: Diagnosis present

## 2014-03-11 DIAGNOSIS — Z79899 Other long term (current) drug therapy: Secondary | ICD-10-CM

## 2014-03-11 DIAGNOSIS — C3400 Malignant neoplasm of unspecified main bronchus: Principal | ICD-10-CM | POA: Diagnosis present

## 2014-03-11 DIAGNOSIS — I10 Essential (primary) hypertension: Secondary | ICD-10-CM | POA: Diagnosis present

## 2014-03-11 DIAGNOSIS — J45909 Unspecified asthma, uncomplicated: Secondary | ICD-10-CM | POA: Diagnosis present

## 2014-03-11 DIAGNOSIS — Z8249 Family history of ischemic heart disease and other diseases of the circulatory system: Secondary | ICD-10-CM

## 2014-03-11 DIAGNOSIS — I471 Supraventricular tachycardia, unspecified: Secondary | ICD-10-CM | POA: Diagnosis present

## 2014-03-11 DIAGNOSIS — I4891 Unspecified atrial fibrillation: Secondary | ICD-10-CM | POA: Diagnosis not present

## 2014-03-11 HISTORY — PX: MEDIASTINOSCOPY: SHX5086

## 2014-03-11 HISTORY — PX: VIDEO BRONCHOSCOPY WITH ENDOBRONCHIAL ULTRASOUND: SHX6177

## 2014-03-11 LAB — COMPREHENSIVE METABOLIC PANEL WITH GFR
ALT: 26 U/L (ref 0–35)
AST: 31 U/L (ref 0–37)
Albumin: 3.9 g/dL (ref 3.5–5.2)
Alkaline Phosphatase: 110 U/L (ref 39–117)
BUN: 13 mg/dL (ref 6–23)
CO2: 17 meq/L — ABNORMAL LOW (ref 19–32)
Calcium: 9.7 mg/dL (ref 8.4–10.5)
Chloride: 98 meq/L (ref 96–112)
Creatinine, Ser: 0.52 mg/dL (ref 0.50–1.10)
GFR calc Af Amer: >90 mL/min
GFR calc non Af Amer: >90 mL/min
Glucose, Bld: 82 mg/dL (ref 70–99)
Potassium: 4.9 meq/L (ref 3.7–5.3)
Sodium: 137 meq/L (ref 137–147)
Total Bilirubin: 0.6 mg/dL (ref 0.3–1.2)
Total Protein: 7.6 g/dL (ref 6.0–8.3)

## 2014-03-11 LAB — MRSA PCR SCREENING: MRSA by PCR: NEGATIVE

## 2014-03-11 LAB — CBC
HCT: 56.9 % — ABNORMAL HIGH (ref 36.0–46.0)
Hemoglobin: 20.3 g/dL — ABNORMAL HIGH (ref 12.0–15.0)
MCH: 34.9 pg — ABNORMAL HIGH (ref 26.0–34.0)
MCHC: 35.7 g/dL (ref 30.0–36.0)
MCV: 97.8 fL (ref 78.0–100.0)
PLATELETS: 220 10*3/uL (ref 150–400)
RBC: 5.82 MIL/uL — ABNORMAL HIGH (ref 3.87–5.11)
RDW: 22.1 % — AB (ref 11.5–15.5)
WBC: 9.9 10*3/uL (ref 4.0–10.5)

## 2014-03-11 LAB — ABO/RH: ABO/RH(D): A POS

## 2014-03-11 LAB — APTT: aPTT: 34 seconds (ref 24–37)

## 2014-03-11 LAB — TYPE AND SCREEN
ABO/RH(D): A POS
ANTIBODY SCREEN: NEGATIVE

## 2014-03-11 LAB — PROTIME-INR
INR: 1.06 (ref 0.00–1.49)
Prothrombin Time: 13.6 seconds (ref 11.6–15.2)

## 2014-03-11 LAB — TSH: TSH: 0.624 u[IU]/mL (ref 0.350–4.500)

## 2014-03-11 SURGERY — BRONCHOSCOPY, WITH EBUS
Anesthesia: General

## 2014-03-11 MED ORDER — ACETAMINOPHEN 325 MG PO TABS: 650.0000 mg | ORAL_TABLET | Freq: Four times a day (QID) | ORAL | Status: DC | PRN

## 2014-03-11 MED ORDER — SODIUM CHLORIDE 0.9 % IJ SOLN
3.0000 mL | Freq: Two times a day (BID) | INTRAMUSCULAR | Status: DC
  Administered 2014-03-11 – 2014-03-14 (×5): 3 mL via INTRAVENOUS

## 2014-03-11 MED ORDER — METOPROLOL TARTRATE 1 MG/ML IV SOLN
INTRAVENOUS | Status: AC
  Filled 2014-03-11: qty 5

## 2014-03-11 MED ORDER — TRAMADOL HCL 50 MG PO TABS: 50.0000 mg | ORAL_TABLET | Freq: Four times a day (QID) | ORAL | Status: DC | PRN

## 2014-03-11 MED ORDER — ROCURONIUM BROMIDE 50 MG/5ML IV SOLN
INTRAVENOUS | Status: AC
  Filled 2014-03-11: qty 1

## 2014-03-11 MED ORDER — SODIUM CHLORIDE 0.9 % IJ SOLN
3.0000 mL | Freq: Two times a day (BID) | INTRAMUSCULAR | Status: DC
  Administered 2014-03-13: 3 mL via INTRAVENOUS

## 2014-03-11 MED ORDER — ONDANSETRON HCL 4 MG/2ML IJ SOLN: 4.0000 mg | Freq: Four times a day (QID) | INTRAMUSCULAR | Status: DC | PRN

## 2014-03-11 MED ORDER — ACETAMINOPHEN 325 MG PO TABS: 650.0000 mg | ORAL_TABLET | ORAL | Status: DC | PRN

## 2014-03-11 MED ORDER — SODIUM CHLORIDE 0.9 % IJ SOLN: 3.0000 mL | Freq: Two times a day (BID) | INTRAMUSCULAR | Status: DC

## 2014-03-11 MED ORDER — ACETAMINOPHEN 650 MG RE SUPP: 650.0000 mg | Freq: Four times a day (QID) | RECTAL | Status: DC | PRN

## 2014-03-11 MED ORDER — ROCURONIUM BROMIDE 100 MG/10ML IV SOLN
INTRAVENOUS | Status: DC | PRN
  Administered 2014-03-11: 40 mg via INTRAVENOUS

## 2014-03-11 MED ORDER — FENTANYL CITRATE 0.05 MG/ML IJ SOLN
INTRAMUSCULAR | Status: AC
  Filled 2014-03-11: qty 5

## 2014-03-11 MED ORDER — ONDANSETRON HCL 4 MG/2ML IJ SOLN
INTRAMUSCULAR | Status: AC
  Filled 2014-03-11: qty 2

## 2014-03-11 MED ORDER — LIDOCAINE HCL (CARDIAC) 20 MG/ML IV SOLN
INTRAVENOUS | Status: AC
  Filled 2014-03-11: qty 10

## 2014-03-11 MED ORDER — HYDROMORPHONE HCL PF 1 MG/ML IJ SOLN: 0.2500 mg | INTRAMUSCULAR | Status: DC | PRN

## 2014-03-11 MED ORDER — SODIUM CHLORIDE 0.9 % IJ SOLN: 3.0000 mL | INTRAMUSCULAR | Status: DC | PRN

## 2014-03-11 MED ORDER — MIDAZOLAM HCL 5 MG/5ML IJ SOLN
INTRAMUSCULAR | Status: DC | PRN
  Administered 2014-03-11: 2 mg via INTRAVENOUS

## 2014-03-11 MED ORDER — ARTIFICIAL TEARS OP OINT
TOPICAL_OINTMENT | OPHTHALMIC | Status: AC
  Filled 2014-03-11: qty 3.5

## 2014-03-11 MED ORDER — METOPROLOL SUCCINATE ER 50 MG PO TB24
50.0000 mg | ORAL_TABLET | Freq: Once | ORAL | Status: AC
  Administered 2014-03-11: 50 mg via ORAL
  Filled 2014-03-11: qty 1

## 2014-03-11 MED ORDER — SENNA 8.6 MG PO TABS
1.0000 | ORAL_TABLET | Freq: Every day | ORAL | Status: DC | PRN
  Filled 2014-03-11: qty 1

## 2014-03-11 MED ORDER — HYDROCOD POLST-CHLORPHEN POLST 10-8 MG/5ML PO LQCR: 5.0000 mL | Freq: Two times a day (BID) | ORAL | Status: DC | PRN

## 2014-03-11 MED ORDER — LEVALBUTEROL HCL 0.63 MG/3ML IN NEBU
0.6300 mg | INHALATION_SOLUTION | Freq: Four times a day (QID) | RESPIRATORY_TRACT | Status: DC | PRN
  Administered 2014-03-12: 0.63 mg via RESPIRATORY_TRACT
  Filled 2014-03-11: qty 3

## 2014-03-11 MED ORDER — PROPOFOL 10 MG/ML IV BOLUS
INTRAVENOUS | Status: DC | PRN
  Administered 2014-03-11: 100 mg via INTRAVENOUS

## 2014-03-11 MED ORDER — FENTANYL CITRATE 0.05 MG/ML IJ SOLN
INTRAMUSCULAR | Status: DC | PRN
  Administered 2014-03-11: 100 ug via INTRAVENOUS

## 2014-03-11 MED ORDER — LACTATED RINGERS IV SOLN
INTRAVENOUS | Status: DC
  Administered 2014-03-11: 11:00:00 via INTRAVENOUS

## 2014-03-11 MED ORDER — ONDANSETRON HCL 4 MG PO TABS: 4.0000 mg | ORAL_TABLET | Freq: Four times a day (QID) | ORAL | Status: DC | PRN

## 2014-03-11 MED ORDER — ATORVASTATIN CALCIUM 20 MG PO TABS
20.0000 mg | ORAL_TABLET | Freq: Every day | ORAL | Status: DC
  Administered 2014-03-11 – 2014-03-13 (×3): 20 mg via ORAL
  Filled 2014-03-11 (×5): qty 1

## 2014-03-11 MED ORDER — ONDANSETRON HCL 4 MG/2ML IJ SOLN
INTRAMUSCULAR | Status: DC | PRN
  Administered 2014-03-11: 4 mg via INTRAVENOUS

## 2014-03-11 MED ORDER — PROPOFOL 10 MG/ML IV BOLUS
INTRAVENOUS | Status: AC
  Filled 2014-03-11: qty 20

## 2014-03-11 MED ORDER — MIDAZOLAM HCL 2 MG/2ML IJ SOLN
INTRAMUSCULAR | Status: AC
  Filled 2014-03-11: qty 2

## 2014-03-11 MED ORDER — DEXTROSE 5 % IV SOLN
INTRAVENOUS | Status: AC
  Filled 2014-03-11: qty 1.5

## 2014-03-11 MED ORDER — BUDESONIDE-FORMOTEROL FUMARATE 160-4.5 MCG/ACT IN AERO
2.0000 | INHALATION_SPRAY | Freq: Two times a day (BID) | RESPIRATORY_TRACT | Status: DC
  Administered 2014-03-11 – 2014-03-14 (×6): 2 via RESPIRATORY_TRACT
  Filled 2014-03-11 (×2): qty 6

## 2014-03-11 MED ORDER — NEOSTIGMINE METHYLSULFATE 1 MG/ML IJ SOLN
INTRAMUSCULAR | Status: DC | PRN
  Administered 2014-03-11: .7 mg via INTRAVENOUS

## 2014-03-11 MED ORDER — METOPROLOL TARTRATE 1 MG/ML IV SOLN
2.5000 mg | Freq: Once | INTRAVENOUS | Status: AC
  Administered 2014-03-11: 2.5 mg via INTRAVENOUS

## 2014-03-11 MED ORDER — ACETAMINOPHEN 650 MG RE SUPP: 650.0000 mg | RECTAL | Status: DC | PRN

## 2014-03-11 MED ORDER — SODIUM CHLORIDE 0.9 % IV SOLN: 250.0000 mL | INTRAVENOUS | Status: DC | PRN

## 2014-03-11 MED ORDER — METOPROLOL SUCCINATE ER 50 MG PO TB24
50.0000 mg | ORAL_TABLET | Freq: Every day | ORAL | Status: DC
  Filled 2014-03-11 (×2): qty 1

## 2014-03-11 MED ORDER — LIDOCAINE HCL (CARDIAC) 20 MG/ML IV SOLN
INTRAVENOUS | Status: DC | PRN
  Administered 2014-03-11: 50 mg via INTRAVENOUS

## 2014-03-11 MED ORDER — ONDANSETRON HCL 4 MG/2ML IJ SOLN: 4.0000 mg | Freq: Once | INTRAMUSCULAR | Status: DC | PRN

## 2014-03-11 MED ORDER — FLUTICASONE PROPIONATE 50 MCG/ACT NA SUSP
2.0000 | Freq: Every day | NASAL | Status: DC
  Administered 2014-03-11 – 2014-03-12 (×2): 2 via NASAL
  Filled 2014-03-11: qty 16

## 2014-03-11 MED ORDER — LISINOPRIL 10 MG PO TABS
10.0000 mg | ORAL_TABLET | Freq: Every day | ORAL | Status: DC
  Administered 2014-03-11 – 2014-03-14 (×4): 10 mg via ORAL
  Filled 2014-03-11 (×5): qty 1

## 2014-03-11 MED ORDER — EPINEPHRINE HCL 1 MG/ML IJ SOLN
INTRAMUSCULAR | Status: DC | PRN
  Administered 2014-03-11: 1 mg via ENDOTRACHEOPULMONARY

## 2014-03-11 MED ORDER — LACTATED RINGERS IV SOLN
INTRAVENOUS | Status: DC | PRN
  Administered 2014-03-11: 15:00:00 via INTRAVENOUS

## 2014-03-11 SURGICAL SUPPLY — 65 items
ADH SKN CLS APL DERMABOND .7 (GAUZE/BANDAGES/DRESSINGS) ×1
APPLIER CLIP LOGIC TI 5 (MISCELLANEOUS) IMPLANT
APR CLP MED LRG 33X5 (MISCELLANEOUS)
BALL CTTN LRG ABS STRL LF (GAUZE/BANDAGES/DRESSINGS)
BLADE SURG 15 STRL LF DISP TIS (BLADE) IMPLANT
BLADE SURG 15 STRL SS (BLADE)
BRUSH CYTOL CELLEBRITY 1.5X140 (MISCELLANEOUS) ×1 IMPLANT
CANISTER SUCTION 2500CC (MISCELLANEOUS) ×4 IMPLANT
CLIP TI MEDIUM 6 (CLIP) IMPLANT
CONT SPEC 4OZ CLIKSEAL STRL BL (MISCELLANEOUS) ×7 IMPLANT
COTTONBALL LRG STERILE PKG (GAUZE/BANDAGES/DRESSINGS) IMPLANT
COVER SURGICAL LIGHT HANDLE (MISCELLANEOUS) ×4 IMPLANT
COVER TABLE BACK 60X90 (DRAPES) ×2 IMPLANT
DERMABOND ADVANCED (GAUZE/BANDAGES/DRESSINGS) ×1
DERMABOND ADVANCED .7 DNX12 (GAUZE/BANDAGES/DRESSINGS) ×1 IMPLANT
DRAPE CHEST BREAST 15X10 FENES (DRAPES) ×2 IMPLANT
ELECT REM PT RETURN 9FT ADLT (ELECTROSURGICAL) ×2
ELECTRODE REM PT RTRN 9FT ADLT (ELECTROSURGICAL) ×1 IMPLANT
FILTER STRAW FLUID ASPIR (MISCELLANEOUS) IMPLANT
FORCEPS BIOP RJ4 1.8 (CUTTING FORCEPS) ×2 IMPLANT
GAUZE SPONGE 4X4 16PLY XRAY LF (GAUZE/BANDAGES/DRESSINGS) ×2 IMPLANT
GLOVE SURG SIGNA 7.5 PF LTX (GLOVE) ×4 IMPLANT
GOWN STRL REUS W/ TWL LRG LVL3 (GOWN DISPOSABLE) ×1 IMPLANT
GOWN STRL REUS W/ TWL XL LVL3 (GOWN DISPOSABLE) ×2 IMPLANT
GOWN STRL REUS W/TWL LRG LVL3 (GOWN DISPOSABLE) ×2
GOWN STRL REUS W/TWL XL LVL3 (GOWN DISPOSABLE) ×4
HEMOSTAT SURGICEL 2X14 (HEMOSTASIS) IMPLANT
KIT BASIN OR (CUSTOM PROCEDURE TRAY) ×2 IMPLANT
KIT ROOM TURNOVER OR (KITS) ×4 IMPLANT
MARKER SKIN DUAL TIP RULER LAB (MISCELLANEOUS) ×2 IMPLANT
NDL BIOPSY TRANSBRONCH 21G (NEEDLE) IMPLANT
NDL BLUNT 18X1 FOR OR ONLY (NEEDLE) IMPLANT
NDL SUPERTRX PREMARK BIOPSY (NEEDLE) IMPLANT
NEEDLE 22X1 1/2 (OR ONLY) (NEEDLE) IMPLANT
NEEDLE BIOPSY TRANSBRONCH 21G (NEEDLE) IMPLANT
NEEDLE BLUNT 18X1 FOR OR ONLY (NEEDLE) IMPLANT
NEEDLE SUPERTRX PREMARK BIOPSY (NEEDLE) ×2 IMPLANT
NEEDLE SYS SONOTIP II EBUSTBNA (NEEDLE) ×2 IMPLANT
NS IRRIG 1000ML POUR BTL (IV SOLUTION) ×4 IMPLANT
OIL SILICONE PENTAX (PARTS (SERVICE/REPAIRS)) IMPLANT
PACK SURGICAL SETUP 50X90 (CUSTOM PROCEDURE TRAY) ×2 IMPLANT
PAD ARMBOARD 7.5X6 YLW CONV (MISCELLANEOUS) ×8 IMPLANT
PENCIL BUTTON HOLSTER BLD 10FT (ELECTRODE) ×2 IMPLANT
SPONGE GAUZE 4X4 12PLY (GAUZE/BANDAGES/DRESSINGS) ×2 IMPLANT
SPONGE INTESTINAL PEANUT (DISPOSABLE) IMPLANT
SUT SILK 2 0 TIES 10X30 (SUTURE) IMPLANT
SUT VIC AB 2-0 CT1 27 (SUTURE)
SUT VIC AB 2-0 CT1 TAPERPNT 27 (SUTURE) IMPLANT
SUT VIC AB 3-0 SH 18 (SUTURE) IMPLANT
SUT VIC AB 3-0 SH 27 (SUTURE) ×2
SUT VIC AB 3-0 SH 27X BRD (SUTURE) ×1 IMPLANT
SUT VICRYL 4-0 PS2 18IN ABS (SUTURE) ×2 IMPLANT
SWAB COLLECTION DEVICE MRSA (MISCELLANEOUS) IMPLANT
SYR 20CC LL (SYRINGE) ×2 IMPLANT
SYR 20ML ECCENTRIC (SYRINGE) ×2 IMPLANT
SYR 5ML LL (SYRINGE) ×2 IMPLANT
SYR 5ML LUER SLIP (SYRINGE) ×2 IMPLANT
SYR CONTROL 10ML LL (SYRINGE) IMPLANT
SYRINGE 10CC LL (SYRINGE) ×2 IMPLANT
TOWEL OR 17X24 6PK STRL BLUE (TOWEL DISPOSABLE) ×4 IMPLANT
TOWEL OR 17X26 10 PK STRL BLUE (TOWEL DISPOSABLE) ×2 IMPLANT
TRAP SPECIMEN MUCOUS 40CC (MISCELLANEOUS) ×2 IMPLANT
TUBE ANAEROBIC SPECIMEN COL (MISCELLANEOUS) IMPLANT
TUBE CONNECTING 12X1/4 (SUCTIONS) ×4 IMPLANT
WATER STERILE IRR 1000ML POUR (IV SOLUTION) ×2 IMPLANT

## 2014-03-11 NOTE — Progress Notes (Signed)
Dr.s Marylene Land at bedside. To be admitted for observation. Mostly NSR /st at present

## 2014-03-11 NOTE — Transfer of Care (Signed)
Immediate Anesthesia Transfer of Care Note  Patient: Brittany [name]  Procedure(s) Performed: Procedure(s): VIDEO BRONCHOSCOPY WITH ENDOBRONCHIAL ULTRASOUND (N/A) MEDIASTINOSCOPY (N/A)  Patient Location: PACU  Anesthesia Type:General  Level of Consciousness: awake and alert   Airway & Oxygen Therapy: Patient Spontanous Breathing and Patient connected to nasal cannula oxygen  Post-op Assessment: Report given to PACU RN and Post -op Vital signs reviewed and stable  Post vital signs: Reviewed and stable  Complications: No apparent anesthesia complications

## 2014-03-11 NOTE — Progress Notes (Signed)
Period of SVT ,ended after  Dr. Glennon Mac did carotid massage. Pt alert and vs stable

## 2014-03-11 NOTE — Consult Note (Signed)
Reason for Consult:  SVT Referring Physician:    [name] is an 53 y.o. female.  HPI:   The patient is a 53 yo female with a history of right hilar mediastinal mass, Tobacco abuse, HTN, asthma, anxiety.  She underwent video bronchscopy with biopies.  She went into SVT which resolved after carotid massage.   Tele strips and current monitor show a lot of PACs.  EKG showed rapid afib at the time.  She current is feeling better but is a little nauseated. She feels that is due to hunger.  Some cough.  The pneumonia she's been treated for is better.  She currently denies vomiting, fever, chest pain, shortness of breath beyond her baseline, orthopnea, dizziness, PND, congestion, abdominal pain, hematochezia, melena, lower extremity edema, claudication.   Past Medical History  Diagnosis Date  . Hypertension   . Allergy   . Asthma   . Anxiety     Past Surgical History  Procedure Laterality Date  . Tubal ligation    . Cesarean section      Family History  Problem Relation Age of Onset  . Heart disease Mother   . Hypertension Mother     Social History:  reports that she has been smoking Cigarettes.  She has a 20 pack-year smoking history. She does not have any smokeless tobacco history on file. She reports that she drinks about 4.8 ounces of alcohol per week. Her drug history is not on file.  Allergies:  Allergies  Allergen Reactions  . Codeine Nausea And Vomiting  . Iodinated Diagnostic Agents Hives and Rash  . Sulfa Antibiotics Hives and Rash    Medications: Scheduled Meds: . atorvastatin  20 mg Oral q1800  . budesonide-formoterol  2 puff Inhalation BID  . cefUROXime (ZINACEF) 1.5 GM IVPB      . fluticasone  2 spray Each Nare Daily  . lisinopril  10 mg Oral Daily  . [START ON 03/12/2014] metoprolol succinate  50 mg Oral Daily  . sodium chloride  3 mL Intravenous Q12H  . sodium chloride  3 mL Intravenous Q12H  . sodium chloride  3 mL Intravenous Q12H    Continuous Infusions: . lactated ringers 10 mL/hr at 03/11/14 1031   PRN Meds:.sodium chloride, sodium chloride, acetaminophen, acetaminophen, chlorpheniramine-HYDROcodone, levalbuterol, ondansetron (ZOFRAN) IV, ondansetron, senna, sodium chloride, sodium chloride, traMADol   Results for orders placed during the hospital encounter of 03/11/14 (from the past 48 hour(s))  CBC     Status: Abnormal   Collection Time    03/11/14 10:02 AM      Result Value Ref Range   WBC 9.9  4.0 - 10.5 K/uL   RBC 5.82 (*) 3.87 - 5.11 MIL/uL   Hemoglobin 20.3 (*) 12.0 - 15.0 g/dL   HCT 56.9 (*) 36.0 - 46.0 %   MCV 97.8  78.0 - 100.0 fL   MCH 34.9 (*) 26.0 - 34.0 pg   MCHC 35.7  30.0 - 36.0 g/dL   RDW 22.1 (*) 11.5 - 15.5 %   Platelets 220  150 - 400 K/uL  COMPREHENSIVE METABOLIC PANEL     Status: Abnormal   Collection Time    03/11/14 10:02 AM      Result Value Ref Range   Sodium 137  137 - 147 mEq/L   Potassium 4.9  3.7 - 5.3 mEq/L   Chloride 98  96 - 112 mEq/L   CO2 17 (*) 19 - 32 mEq/L   Glucose, Bld 82  70 - 99 mg/dL   BUN 13  6 - 23 mg/dL   Creatinine, Ser 0.52  0.50 - 1.10 mg/dL   Calcium 9.7  8.4 - 10.5 mg/dL   Total Protein 7.6  6.0 - 8.3 g/dL   Albumin 3.9  3.5 - 5.2 g/dL   AST 31  0 - 37 U/L   Comment: HEMOLYSIS AT THIS LEVEL MAY AFFECT RESULT   ALT 26  0 - 35 U/L   Alkaline Phosphatase 110  39 - 117 U/L   Total Bilirubin 0.6  0.3 - 1.2 mg/dL   GFR calc non Af Amer >90  >90 mL/min   GFR calc Af Amer >90  >90 mL/min   Comment: (NOTE)     The eGFR has been calculated using the CKD EPI equation.     This calculation has not been validated in all clinical situations.     eGFR's persistently <90 mL/min signify possible Chronic Kidney     Disease.  TYPE AND SCREEN     Status: None   Collection Time    03/11/14 10:05 AM      Result Value Ref Range   ABO/RH(D) A POS     Antibody Screen NEG     Sample Expiration 03/14/2014    ABO/RH     Status: None   Collection Time    03/11/14  10:05 AM      Result Value Ref Range   ABO/RH(D) A POS    PROTIME-INR     Status: None   Collection Time    03/11/14 11:25 AM      Result Value Ref Range   Prothrombin Time 13.6  11.6 - 15.2 seconds   Comment: SPECIMEN COLLECTED IN ANTICOAGULATED ADJUSTED TUBE   INR 1.06  0.00 - 1.49  APTT     Status: None   Collection Time    03/11/14 11:25 AM      Result Value Ref Range   aPTT 34  24 - 37 seconds   Comment: SPECIMEN COLLECTED IN ANTICOAGULANT ADJUSTED TUBE    Chest 2 View  03/11/2014   CLINICAL DATA:  Known hilar and mediastinal mass  EXAM: CHEST  2 VIEW  COMPARISON:  03/05/2014  FINDINGS: Cardiac shadow is stable. The lungs are well aerated bilaterally. The left lung is clear. The known right hilar/ mediastinal mass is again identified. No definitive infiltrate is identified.  IMPRESSION: Stable right hilar/ mediastinal mass.   Electronically Signed   By: Inez Catalina M.D.   On: 03/11/2014 11:52   Dg Chest Port 1 View  03/11/2014   CLINICAL DATA:  Status post bronchoscopy  EXAM: PORTABLE CHEST - 1 VIEW  COMPARISON:  Film from earlier in the same day.  FINDINGS: Prominent right perihilar density is noted consistent with the known history of mediastinal mass. No pneumothorax or pleural effusion is seen. No focal infiltrate is noted.  IMPRESSION: No evidence of pneumothorax following bronchoscopy.   Electronically Signed   By: Inez Catalina M.D.   On: 03/11/2014 16:43    Review of Systems  Constitutional: Negative for fever and diaphoresis.  HENT: Negative for congestion.   Respiratory: Positive for cough and shortness of breath (No more than baseline).   Cardiovascular: Negative for chest pain, palpitations, leg swelling and PND.  Gastrointestinal: Positive for nausea. Negative for vomiting, abdominal pain, blood in stool and melena.  Genitourinary: Negative for hematuria.  Musculoskeletal: Negative.   Neurological: Negative for dizziness.  All other systems reviewed and  are  negative.   Blood pressure 144/100, pulse 116, temperature 98.6 F (37 C), temperature source Oral, resp. rate 27, SpO2 98.00%. Physical Exam  Constitutional: She is oriented to person, place, and time. She appears well-developed and well-nourished. No distress.  HENT:  Head: Normocephalic and atraumatic.  Eyes: EOM are normal. Pupils are equal, round, and reactive to light. No scleral icterus.  Neck: Normal range of motion. No JVD present.  Cardiovascular: S1 normal and S2 normal.  A regularly irregular rhythm present.  No murmur heard. Pulses:      Radial pulses are 2+ on the right side, and 2+ on the left side.       Dorsalis pedis pulses are 1+ on the right side, and 1+ on the left side.  No carotid bruit  Respiratory: Effort normal. She has wheezes.  Decreased BS particularly in the RUL, + rhonchi,    GI: Soft. Bowel sounds are normal. She exhibits no distension. There is no tenderness.  Musculoskeletal: She exhibits no edema.  Neurological: She is alert and oriented to person, place, and time. She exhibits normal muscle tone.  Skin: Skin is warm and dry.  Psychiatric: She has a normal mood and affect.    Assessment/Plan: 1. SVT/Afib RVR Will continue toprol XL.  Will check 2D echo.  Can consider CCB instead of BB(due to wheezing) if normal EF.   May be from stress of procedure.  Prior EKG(03/05/14) was SR with prolong QTc.  BP stable.  Will also check a TSH.    Tom Macpherson 03/11/2014, 5:33 PM

## 2014-03-11 NOTE — Anesthesia Preprocedure Evaluation (Signed)
Anesthesia Evaluation  Patient identified by MRN, date of birth, ID band Patient awake    Reviewed: Allergy & Precautions, H&P , NPO status   Airway       Dental   Pulmonary asthma , Current Smoker,          Cardiovascular hypertension,     Neuro/Psych Anxiety    GI/Hepatic   Endo/Other    Renal/GU      Musculoskeletal   Abdominal   Peds  Hematology   Anesthesia Other Findings Lung mass  Reproductive/Obstetrics                           Anesthesia Physical Anesthesia Plan  ASA: II  Anesthesia Plan: General   Post-op Pain Management:    Induction: Intravenous  Airway Management Planned: Oral ETT  Additional Equipment:   Intra-op Plan:   Post-operative Plan: Extubation in OR  Informed Consent: I have reviewed the patients History and Physical, chart, labs and discussed the procedure including the risks, benefits and alternatives for the proposed anesthesia with the patient or authorized representative who has indicated his/her understanding and acceptance.     Plan Discussed with:   Anesthesia Plan Comments:         Anesthesia Quick Evaluation

## 2014-03-11 NOTE — Op Note (Signed)
NAMEMarland Kitchen  MATSUKO, KRETZ NO.:  [id number]  MEDICAL RECORD NO.:  [id number]  LOCATION:  [id number]                        FACILITY:  [facility]  PHYSICIAN:  Revonda Standard. Roxan Hockey, M.D.DATE OF BIRTH:  Apr 22, 1961  DATE OF PROCEDURE:  03/11/2014 DATE OF DISCHARGE:                              OPERATIVE REPORT   PREOPERATIVE DIAGNOSIS:  Right hilar/ mediastinal mass.  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  PROCEDURE:  Bronchoscopy with biopsies, brushings, and needle aspirations.  SURGEON:  Revonda Standard. Roxan Hockey, M.D.  ANESTHESIA:  General.  FINDINGS:  Large mass with deformity of the carina, endobronchial mass in the right main stem.  Frozen section showed epithelioid neoplasm. Small tumor deposit versus direct invasion from the mediastinum in the left main stem bronchus. Severe extrinsic compression of the right mainstem bronchus, but the bronchoscope was able to pass.  CLINICAL NOTE:  Brittany [name] is a 53 year old woman with a history of tobacco abuse who presented with a cough and shortness of breath, as well as wheezing.  Her chest x-ray showed a right hilar mass. A CT scan showed a large right hilar and mediastinal mass with extrinsic compression of the right mainstem bronchus and the right pulmonary artery.  She was advised to undergo bronchoscopy, possible endobronchial ultrasound and possible mediastinoscopy for diagnostic purposes.  The indications, risks, benefits, and alternatives were discussed in detail with the patient.  She understood and accepted the risks and agreed to proceed.  OPERATIVE NOTE:  Brittany [name] was brought to the operating room on March 11, 2014.  She had induction of general anesthesia and was intubated.  Flexible fiberoptic bronchoscopy was performed via the endotracheal tube.  There was a large extrinsic mass at the carina with deformity of the carina.   The carina was blunted and there was a large mass protruding into the right main stem just beyond the carina.  There was a small tumor deposit in the left main stem, it was unclear if this is direct extension or a separate tumor deposit.  The right mainstem bronchus was nearly completely compressed extrinsically. The right upper lobe bronchus was never visualized.  The bronchoscope was able to pass through the bronchus intermedius and visualize the basilar segmental branches of the lower lobe as well as the middle lobe branches.  The superior segmental bronchus could not be visualized.  Biopsies were taken from the right mainstem mass.  These were sent for frozen section. While awaiting those results, brushings were obtained from that area as well as deep around the bronchus intermedius. Needle aspirations were performed of the subcarinal mass. They were done transtracheally using a Wang needle. The biopsy specimen returned with epithelioid neoplasm, more tissue was requested.  Additional biopsies were taken, including the left main stem mass, the originally biopsied right mainstem mass as well as the right mainstem bronchus further distally.  These were all sent for permanent pathology as were the needle aspirations and the brushings. The patient was extubated in the operating room and taken to the postanesthetic care unit in good condition.     Revonda Standard Roxan Hockey, M.D.     SCH/MEDQ  D:  03/11/2014  T:  03/11/2014  Job:  [id number]

## 2014-03-11 NOTE — Progress Notes (Addendum)
Patient had SVT into the 130's after arrival in PACU. Dr. Glennon Mac performed a carotid massage. Patient remaining in sinus rhythm. I consulted cardiology Press photographer and spoke to [place] midlevel who will see today). Patient to be admitted and observed over night.   As above. In and out of a fib in PACU. Currently in SR and resting comfortably  Will have Oncology and Radiation Oncology see her while she is here  Will MR of brain tomorrow as well if rhythm stable

## 2014-03-11 NOTE — Anesthesia Procedure Notes (Signed)
Procedure Name: Intubation Date/Time: 03/11/2014 3:06 PM Performed by: [place] Pink Pre-anesthesia Checklist: Emergency Drugs available, Patient identified, Timeout performed, Suction available and Patient being monitored Patient Re-evaluated:Patient Re-evaluated prior to inductionOxygen Delivery Method: Circle system utilized Preoxygenation: Pre-oxygenation with 100% oxygen Intubation Type: IV induction Ventilation: Mask ventilation without difficulty and Oral airway inserted - appropriate to patient size Laryngoscope Size: Mac and 3 Grade View: Grade II Tube type: Oral Tube size: 8.0 mm Number of attempts: 1 Airway Equipment and Method: Stylet Placement Confirmation: ETT inserted through vocal cords under direct vision,  breath sounds checked- equal and bilateral and positive ETCO2 Secured at: 21 cm Tube secured with: Tape Dental Injury: Teeth and Oropharynx as per pre-operative assessment

## 2014-03-11 NOTE — Anesthesia Postprocedure Evaluation (Signed)
  Anesthesia Post-op Note  Patient: Brittany [name]  Procedure(s) Performed: Procedure(s): VIDEO BRONCHOSCOPY WITH ENDOBRONCHIAL ULTRASOUND (N/A) MEDIASTINOSCOPY (N/A)  Patient Location: PACU  Anesthesia Type:General  Level of Consciousness: awake, alert , oriented and patient cooperative  Airway and Oxygen Therapy: Patient Spontanous Breathing and Patient connected to nasal cannula oxygen  Post-op Pain: mild sore throat  Post-op Assessment: Post-op Vital signs reviewed, Patient's Cardiovascular Status Stable, Respiratory Function Stable, Patent Airway, No signs of Nausea or vomiting and Pain level controlled  Post-op Vital Signs: Reviewed and stable, no recurrence of SVT  Complications: No apparent anesthesia complications

## 2014-03-11 NOTE — Progress Notes (Signed)
Pt admitted and transferring to 2W for Cardiac Telemetry . Cardiologist to see on unit. Pt continues to have spontaneous short intervals of SVT .  Resp with no distress ,on O2 at 2L , expiratory wheezes present .Report to [facility] RN and transported by Jaynie Crumble RN

## 2014-03-11 NOTE — Brief Op Note (Signed)
03/11/2014  4:16 PM  PATIENT:  Brittany [name]  53 y.o. female  PRE-OPERATIVE DIAGNOSIS:  RIGHT HILAR//MEDIASTINAL MASS  POST-OPERATIVE DIAGNOSIS:  RIGHT HILAR//MEDIASTINAL MASS, EPITHELIOD NEOPLASM  PROCEDURE:  VIDEO BROCHOSCOPY with BIOPSIES, BRUSHINGS, ASPIRATIONS  SURGEON:  Surgeon(s) and Role:    * Melrose Nakayama, MD - Primary    ANESTHESIA:   general  EBL:     BLOOD ADMINISTERED:none  DRAINS: none   LOCAL MEDICATIONS USED:  NONE  SPECIMEN:  Source of Specimen:  multiple  DISPOSITION OF SPECIMEN:  PATHOLOGY  PLAN OF CARE: Discharge to home after PACU  PATIENT DISPOSITION:  PACU - hemodynamically stable.   Delay start of Pharmacological VTE agent (>24hrs) due to surgical blood loss or risk of bleeding: not applicable  Extrinsic mass at carina with invasion through R main stem. Near complete extrinsic compression of right mainstem bronchus, bronchoscope was able to pass. 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

## 2014-03-11 NOTE — Progress Notes (Signed)
freq vent beats at 100-130 . Dr. Glennon Mac and Dr. Roxan Hockey notified.

## 2014-03-11 NOTE — H&P (View-Only) (Signed)
PCP is No primary provider on file. Referring Provider is Wandra Arthurs, MD  Chief Complaint  Patient presents with  . Lung Mass    Surgical eval on Lung Mass, Chest CTA 03/06/14    HPI: [name] is a 52 year old woman sent for consultation regarding a right hilar and mediastinal mass.  [name] 77-year-old woman with a history of hypertension, asthma, and 20+-pack-year history of tobacco abuse. She is currently smoking about one half a pack of cigarettes a day.  She has been losing weight over the past 6 months, but otherwise had been feeling well up until about 2 weeks ago. She had a cough for a few weeks, however that cough worsened and she began experiencing some chest tightness and shortness of breath. Her inhalers did not seem to help. She went to urgent care and a chest x-ray was done which showed a possible right hilar mass and pneumonia. She was treated with Levaquin, with a plan to repeat her chest x-ray and a later time.  She completed her course of Levaquin but was continuing to have worsening symptoms. She was given a second course of Levaquin. She continued to have a persistent cough was having worsening shortness of breath and chest tightness. A repeat chest x-ray again suggested a right hilar mass. A CT of the chest was done on March 26, which showed a large right hilar and mediastinal mass.  She has had a persisting cough. It is productive of yellowish sputum. She has not had any hemoptysis. She has lost 19 pounds over the past 6 months and 12 pounds over the past 3 months, most of that was in the last 2 weeks. Her appetite is poor and she has decreased energy, she attributes these symptoms to her pneumonia. She says that she has a sensation of fullness and tightness in her chest and feels like she cannot get a deep breath. She is constantly wheezing.  ZUBROD score = 1  Past Medical History  Diagnosis Date  . Hypertension   . Allergy   . Asthma   .  Anxiety     Past Surgical History  Procedure Laterality Date  . Tubal ligation    . Cesarean section      Family History  Problem Relation Age of Onset  . Heart disease Mother   . Hypertension Mother     Social History History  Substance Use Topics  . Smoking status: Current Every Day Smoker -- 1.00 packs/day for 20 years    Types: Cigarettes  . Smokeless tobacco: Not on file  . Alcohol Use: 4.8 oz/week    8 Glasses of wine per week    Current Outpatient Prescriptions  Medication Sig Dispense Refill  . albuterol (PROVENTIL HFA) 108 (90 BASE) MCG/ACT inhaler Inhale 2 puffs into the lungs every 4 (four) hours as needed for wheezing or shortness of breath.  6.7 each  5  . albuterol (PROVENTIL) (2.5 MG/3ML) 0.083% nebulizer solution Take 3 mLs (2.5 mg total) by nebulization every 6 (six) hours as needed for wheezing.  150 mL  1  . atorvastatin (LIPITOR) 20 MG tablet Take 1 tablet (20 mg total) by mouth daily.  90 tablet  0  . budesonide-formoterol (SYMBICORT) 160-4.5 MCG/ACT inhaler Inhale 2 puffs into the lungs 2 (two) times daily.      . chlorpheniramine-HYDROcodone (TUSSIONEX PENNKINETIC ER) 10-8 MG/5ML LQCR Take 5 mLs by mouth every 12 (twelve) hours as needed.  60 mL  0  .  fluticasone (FLONASE) 50 MCG/ACT nasal spray Place 2 sprays into both nostrils daily.      Marland Kitchen levofloxacin (LEVAQUIN) 500 MG tablet Take 1 tablet (500 mg total) by mouth daily.  5 tablet  0  . lisinopril (PRINIVIL,ZESTRIL) 10 MG tablet Take 10 mg by mouth daily.      . metoprolol succinate (TOPROL-XL) 50 MG 24 hr tablet Take 1 tablet (50 mg total) by mouth daily. Take with or immediately following a meal.  30 tablet  3   No current facility-administered medications for this visit.    Allergies  Allergen Reactions  . Codeine Nausea And Vomiting  . Iodinated Diagnostic Agents Hives and Rash  . Sulfa Antibiotics Hives and Rash    Review of Systems  Constitutional: Positive for fever, appetite change,  fatigue and unexpected weight change (Lost 20 pounds in 6 months and 12 pounds in last 3 months).  Respiratory: Positive for cough (No hemoptysis), chest tightness, shortness of breath, wheezing and stridor.   Cardiovascular: Positive for chest pain and leg swelling (when blood pressure elevated).  All other systems reviewed and are negative.    BP 156/96  Pulse 104  Resp 20  Ht 4' 11.5" (1.511 m)  Wt 117 lb (53.071 kg)  BMI 23.24 kg/m2  SpO2 94% Physical Exam  Vitals reviewed. Constitutional: She is oriented to person, place, and time. She appears well-developed and well-nourished. No distress.  HENT:  Head: Normocephalic and atraumatic.  Eyes: EOM are normal. Pupils are equal, round, and reactive to light.  Neck: Neck supple. No JVD present. No thyromegaly present.  Cardiovascular: Normal rate, regular rhythm, normal heart sounds and intact distal pulses.   No murmur heard. Pulmonary/Chest:  Mild stridor and wheezing on right, transmitted to the left  Abdominal: Soft. There is no tenderness.  Musculoskeletal: She exhibits no edema.  Lymphadenopathy:    She has no cervical adenopathy.  Neurological: She is alert and oriented to person, place, and time. No cranial nerve deficit.  No focal motor deficit  Skin: Skin is warm and dry.     Diagnostic Tests: CT ANGIOGRAPHY CHEST WITH CONTRAST  TECHNIQUE:  Multidetector CT imaging of the chest was performed using the  standard protocol during bolus administration of intravenous  contrast. Multiplanar CT image reconstructions and MIPs were  obtained to evaluate the vascular anatomy.  CONTRAST: 60mL OMNIPAQUE IOHEXOL 350 MG/ML SOLN  COMPARISON: DG CHEST 2 VIEW dated 03/05/2014; DG CHEST 2V dated  03/04/2014; DG CHEST 2V dated 02/24/2014  FINDINGS:  Thoracic aorta unremarkable. No evidence of pulmonary embolus. There  is narrowing of the right pulmonary artery secondary to large  mediastinal and hilar mass. Cardiomegaly. Small  pericardial  effusion.  Extensive mediastinal and hilar adenopathy and/or mass lesion is  present. This results in narrowing of the right pulmonary artery and  near complete occlusion of the right bronchus. The mass extends into  the subcarinal region and into the left hilum. Associated left hilar  adenopathy is most likely present. The mass conglomerate measures up  to 8.1 x 6.5 x 7.3 cm. Visualized portions of the thoracic esophagus  unremarkable.  Trachea and left mainstem bronchus are patent. Again there is  near-complete occlusion of the right mainstem bronchus. Infiltrative  changes are present in the right middle and right lower lobes. These  changes are most likely related to postobstructive pneumonitis. No  pneumothorax. No significant pleural effusion.  Adrenals normal. Visualized upper abdominal viscera unremarkable.  Punctate lucency in the right thyroid  lobe, most likely tiny cysts.  Shotty bilateral axillary lymph nodes. Breast tissue is dense with  asymmetric densities for which further evaluation with mammography  suggested. No focal bony abnormalities identified.  Review of the MIP images confirms the above findings.  IMPRESSION:  1. Large mediastinal and right hilar mass lesions with narrowing of  the right pulmonary artery and near complete occlusion of the right  mainstem bronchus. These changes are most likely malignant and  related to a process such as primary lung cancer with metastatic  lymphadenopathy, metastatic adenopathy from other primary, or  lymphoma. PET-CT can be obtained for further evaluation.  2. No evidence of pulmonary embolus. Again the right pulmonary  artery is narrowed secondary to the above described mass lesion.  3. Postobstructive right middle lobe and right lower lobe pneumonia.  Again the right mainstem bronchus is almost completely occluded by  the above-described mass.  4. Breasts are bilaterally dense with asymmetric densities, further   evaluation with mammography suggested.  5. Cardiomegaly with small pericardial effusion.  Electronically Signed  By: Marcello Moores Register  On: 03/05/2014 15:53   Impression: 53 year old woman with a history of tobacco abuse who presents with progressive cough and dyspnea. She also has been having problems with her appetite and has had significant weight loss. Workup reveals a large right hilar and mediastinal mass with near complete occlusion of the right mainstem along with narrowing of the right pulmonary artery. This is almost certainly a malignancy. 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 She needs a biopsy urgently so that appropriate treatment can be initiated.  I recommended to the patient that we proceed with bronchoscopy, possible EBUS, possible mediastinoscopy in the operating room tomorrow. I described the procedure to her in detail. We will plan to do this under general anesthesia. We will perform bronchoscopy first. If we are able to establish a diagnosis then we would not need to go further. If no diagnosis with bronchoscopy we will plan to go ahead with EBUS, and likewise if that is unrevealing we'll proceed with mediastinoscopy at the same setting. It is imperative that we get a diagnosis as soon as possible.  I discussed with her the nature of the procedure including the need for general anesthesia, the incisions to be used if necessary, and the expected hospital stay if needed. She understands that this is a diagnostic and not a therapeutic procedure. We discussed the indications, risks, benefits, and alternatives. She understands risks include those associated with general anesthesia. The risks also include bleeding, pneumothorax, in addition to generalized risks such as wound infection, MI, DVT, PE. She does understand there is a possibility of unforeseeable complications.  She accepts the risks of surgery and wishes  to proceed  Pla Bronchoscopy, possible EBUS, possible mediastinoscopy tomorrow afternoon

## 2014-03-11 NOTE — Interval H&P Note (Signed)
History and Physical Interval Note:  03/11/2014 2:11 PM  Brittany [name]  has presented today for surgery, with the diagnosis of RIGHT HILAR//MEDIASTINAL MASS  The various methods of treatment have been discussed with the patient and family. After consideration of risks, benefits and other options for treatment, the patient has consented to  Procedure(s): VIDEO BRONCHOSCOPY WITH ENDOBRONCHIAL ULTRASOUND (N/A) MEDIASTINOSCOPY (N/A) as a surgical intervention .  The patient's history has been reviewed, patient examined, no change in status, stable for surgery.  I have reviewed the patient's chart and labs.  Questions were answered to the patient's satisfaction.     HENDRICKSON,STEVEN C

## 2014-03-11 NOTE — Consult Note (Signed)
Personally seen and examined, agree with above. Data reviewed. During/post bronchoscopy today with Dr. Roxan Hockey patient developed arrhythmia which apparently was resolved with carotid sinus massage. Telemetry strips reviewed on paper chart. EKGs reviewed. Telemetry reviewed. Currently, she is in sinus rhythm with frequent PACs. During one telemetry strip from anesthesia she demonstrates atrial bigeminy with an overall tachycardic rate. At one point EKG demonstrates an irregularly irregular rhythm with coarse fibrillatory wave consistent with atrial fibrillation with rapid ventricular response heart rate of 170. Another telemetry strip shows what looks like paroxysmal atrial tachycardia. This was all peri-procedure and likely exacerbated by the overall stress of procedure with concomitant background atrial dysfunction. She has been on metoprolol 50 mg extended release however she states that she has not taken this recently. We will continue this medicine she will get tomorrow morning and check an echocardiogram to ensure proper structure and function of her heart. Her lung exam demonstrates quite significant wheezing bilaterally, rhonchorous breaths. Heart rate currently regular rhythm with occasional ectopy. No edema. We will check TSH. Monitor electrolytes.  She also notes that she has had occasional episode of syncope, profound weakness, prodrome of diaphoresis, pallor, weakness , Nausea suggestive of vasovagal reaction. She had been to the emergency department recently because of one of these episodes. It was no evidence of SVT during evaluation. If syncope returns, one could consider event monitor to ensure that she does not have any unstable tachycardia arrhythmias preceding her symptoms.  We will follow along.

## 2014-03-11 NOTE — Discharge Instructions (Addendum)
Do not drive or engage in heavy physical activity for 24 hours  You will probably cough up small amounts of blood for the next few days  My office will contact you with follow up instructions

## 2014-03-12 ENCOUNTER — Ambulatory Visit
Admit: 2014-03-12 | Discharge: 2014-03-12 | Disposition: A | Discharge status: Home / Self Care | Payer: BC Managed Care – PPO | Attending: Radiation Oncology | Admitting: Radiation Oncology

## 2014-03-12 ENCOUNTER — Other Ambulatory Visit: Payer: Self-pay

## 2014-03-12 ENCOUNTER — Encounter (HOSPITAL_COMMUNITY): Payer: Self-pay | Admitting: Thoracic Surgery (Cardiothoracic Vascular Surgery)

## 2014-03-12 ENCOUNTER — Inpatient Hospital Stay (HOSPITAL_COMMUNITY): Payer: BC Managed Care – PPO

## 2014-03-12 DIAGNOSIS — C3400 Malignant neoplasm of unspecified main bronchus: Secondary | ICD-10-CM | POA: Insufficient documentation

## 2014-03-12 DIAGNOSIS — C3490 Malignant neoplasm of unspecified part of unspecified bronchus or lung: Secondary | ICD-10-CM

## 2014-03-12 DIAGNOSIS — R918 Other nonspecific abnormal finding of lung field: Secondary | ICD-10-CM

## 2014-03-12 DIAGNOSIS — I517 Cardiomegaly: Secondary | ICD-10-CM

## 2014-03-12 DIAGNOSIS — R0609 Other forms of dyspnea: Secondary | ICD-10-CM

## 2014-03-12 DIAGNOSIS — I4891 Unspecified atrial fibrillation: Secondary | ICD-10-CM | POA: Diagnosis present

## 2014-03-12 DIAGNOSIS — Z510 Encounter for antineoplastic radiation therapy: Secondary | ICD-10-CM | POA: Insufficient documentation

## 2014-03-12 DIAGNOSIS — R0989 Other specified symptoms and signs involving the circulatory and respiratory systems: Secondary | ICD-10-CM

## 2014-03-12 DIAGNOSIS — I480 Paroxysmal atrial fibrillation: Secondary | ICD-10-CM | POA: Diagnosis present

## 2014-03-12 LAB — BASIC METABOLIC PANEL
BUN: 12 mg/dL (ref 6–23)
CALCIUM: 9.1 mg/dL (ref 8.4–10.5)
CO2: 23 mEq/L (ref 19–32)
CREATININE: 0.67 mg/dL (ref 0.50–1.10)
Chloride: 99 mEq/L (ref 96–112)
GFR calc non Af Amer: >90 mL/min (ref >90)
Glucose, Bld: 123 mg/dL — ABNORMAL HIGH (ref 70–99)
Potassium: 4.3 mEq/L (ref 3.7–5.3)
Sodium: 138 mEq/L (ref 137–147)

## 2014-03-12 MED ORDER — DILTIAZEM HCL 100 MG IV SOLR
5.0000 mg/h | INTRAVENOUS | Status: DC
  Administered 2014-03-12: 5 mg/h via INTRAVENOUS
  Filled 2014-03-12: qty 100

## 2014-03-12 MED ORDER — METOPROLOL SUCCINATE ER 50 MG PO TB24
50.0000 mg | ORAL_TABLET | Freq: Every day | ORAL | Status: DC
  Administered 2014-03-12 – 2014-03-14 (×3): 50 mg via ORAL
  Filled 2014-03-12 (×3): qty 1

## 2014-03-12 NOTE — Progress Notes (Addendum)
Pt discharge from [facility] to [facility] via [facility]. Report called to [facility] rn Spess,RN. [facility] here to pick pt up.

## 2014-03-12 NOTE — Progress Notes (Signed)
Subjective: Feels good.  Can feel her HR up at times.  Objective: Vital signs in last 24 hours: Temp:  [97 F (36.1 C)-98.9 F (37.2 C)] 97 F (36.1 C) (04/02 0442) Pulse Rate:  [59-132] 59 (04/02 0442) Resp:  [0-27] 18 (04/02 0442) BP: (113-174)/(72-105) 120/72 mmHg (04/02 0442) SpO2:  [91 %-98 %] 96 % (04/02 0442) Weight:  [117 lb 1 oz (53.1 kg)-120 lb 2.4 oz (54.5 kg)] 120 lb 2.4 oz (54.5 kg) (04/01 1814)    Intake/Output from previous day: 04/01 0701 - 04/02 0700 In: 1254.8 [P.O.:320; I.V.:934.8] Out: -  Intake/Output this shift:    Medications Current Facility-Administered Medications  Medication Dose Route Frequency Provider Last Rate Last Dose  . 0.9 %  sodium chloride infusion  250 mL Intravenous PRN Melrose Nakayama, MD      . 0.9 %  sodium chloride infusion  250 mL Intravenous PRN Donielle Liston Alba, PA-C      . acetaminophen (TYLENOL) tablet 650 mg  650 mg Oral Q4H PRN Melrose Nakayama, MD       Or  . acetaminophen (TYLENOL) suppository 650 mg  650 mg Rectal Q4H PRN Melrose Nakayama, MD      . atorvastatin (LIPITOR) tablet 20 mg  20 mg Oral q1800 Nani Skillern, PA-C   20 mg at 03/11/14 2023  . budesonide-formoterol (SYMBICORT) 160-4.5 MCG/ACT inhaler 2 puff  2 puff Inhalation BID Nani Skillern, PA-C   2 puff at 03/11/14 2300  . chlorpheniramine-HYDROcodone (TUSSIONEX) 10-8 MG/5ML suspension 5 mL  5 mL Oral Q12H PRN Donielle Liston Alba, PA-C      . fluticasone [facility]) 50 MCG/ACT nasal spray 2 spray  2 spray Each Nare Daily Nani Skillern, PA-C   2 spray at 03/11/14 2023  . lactated ringers infusion   Intravenous Continuous Kate Sable, MD      . levalbuterol [facility]) nebulizer solution 0.63 mg  0.63 mg Nebulization Q6H PRN Nani Skillern, PA-C      . lisinopril (PRINIVIL,ZESTRIL) tablet 10 mg  10 mg Oral Daily Nani Skillern, PA-C   10 mg at 03/11/14 2023  . metoprolol succinate (TOPROL-XL) 24 hr tablet 50 mg   50 mg Oral Daily Donielle M Zimmerman, PA-C      . ondansetron [facility]) tablet 4 mg  4 mg Oral Q6H PRN Nani Skillern, PA-C       Or  . ondansetron [facility]) injection 4 mg  4 mg Intravenous Q6H PRN Donielle Liston Alba, PA-C      . senna (SENOKOT) tablet 8.6 mg  1 tablet Oral Daily PRN Donielle Liston Alba, PA-C      . sodium chloride 0.9 % injection 3 mL  3 mL Intravenous Q12H Melrose Nakayama, MD      . sodium chloride 0.9 % injection 3 mL  3 mL Intravenous PRN Melrose Nakayama, MD      . sodium chloride 0.9 % injection 3 mL  3 mL Intravenous Q12H Donielle Liston Alba, PA-C      . sodium chloride 0.9 % injection 3 mL  3 mL Intravenous Q12H Nani Skillern, PA-C   3 mL at 03/11/14 2152  . sodium chloride 0.9 % injection 3 mL  3 mL Intravenous PRN Donielle Liston Alba, PA-C      . traMADol Veat[facility]bon) tablet 50 mg  50 mg Oral Q6H PRN Nani Skillern, PA-C        PE: General appearance:  alert, cooperative and no distress Lungs: Diffuse wheezing worse on the right Heart: irregularly irregular rhythm Extremities: No LEE Pulses: 2+ and symmetric Skin: Warm and dry Neurologic: Grossly normal  Lab Results:   Recent Labs  03/11/14 1002  WBC 9.9  HGB 20.3*  HCT 56.9*  PLT 220   BMET  Recent Labs  03/11/14 1002 03/12/14 0530  NA 137 138  K 4.9 4.3  CL 98 99  CO2 17* 23  GLUCOSE 82 123*  BUN 13 12  CREATININE 0.52 0.67  CALCIUM 9.7 9.1   PT/INR  Recent Labs  03/11/14 1125  LABPROT 13.6  INR 1.06    Assessment/Plan  53 yo female with a history of right hilar mediastinal mass, Tobacco abuse, HTN, asthma, anxiety.  No prior cardiac history.  Was scheduled to se Dr. Debara Pickett next week.  She underwent video bronchscopy with biopies. She went into SVT which resolved after carotid massage. Tele strips and current monitor show a lot of PACs. EKG showed rapid afib at the time.  Principal Problem:   Atrial fibrillation Rate is very erratic, ranging from  49-150's.  Currently on Toprol 50mg .  TSH WNL.  Will check a magnesium.  She does not have any signs or symptoms of CHF so I am going to add iv diltiazem and start at 5mg  and hold the toprol XL as she is wheezing quite a bit.  She has xopenex PRN ordered.   She has an echo ordered and would like to slow her down enough, and consistently, to get good pictures.  EP consult.  Active Problems:   HTN (hypertension)  Controlled this morning.  Continue to monitor.     Lung mass  Per CTS   SVT (supraventricular tachycardia)  May have been afib.     LOS: 1 day    HAGER, BRYAN PA-C 03/12/2014 8:02 AM Patient seen and examined. I agree with the assessment and plan as detailed above. See also my additional thoughts below.   The patient is having bursts of atrial fibrillation. For now we will change her medicines to diltiazem. Ultimately it would be good for her to be on oral diltiazem. Decisions about possible anticoagulation would have to be based on issues including the significant workup and treatment that she needs for her mediastinal mass.  Dola Argyle, MD, [facility] 03/12/2014 8:53 AM

## 2014-03-12 NOTE — Progress Notes (Signed)
  Echocardiogram 2D Echocardiogram has been performed.  Diamond Nickel 03/12/2014, 12:04 PM

## 2014-03-12 NOTE — Progress Notes (Signed)
Utilization Review Completed.Donne Anon T4/01/2014

## 2014-03-12 NOTE — Progress Notes (Signed)
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   NARRATIVE: The patient was brought to the [place].  Identity was confirmed.  All relevant records and images related to the planned course of therapy were reviewed.  Then, the patient was positioned in a stable reproducible clinical set-up for radiation therapy.  CT images were obtained.  Skin markings were placed.  The CT images were loaded into the planning software where the target and avoidance structures were contoured.  The radiation prescription was entered and confirmed.   TREATMENT PLANNING NOTE:  Treatment planning then occurred. I have requested 3D simulation with [facility] of the spinal cord, total lungs and gross tumor volume. I have also requested mlcs and an isodose plan.   Special treatment procedure will be performed as Marisella Puccio will be receiving concurrent chemotherapy.   I have ordered a consult with the dietician for monitoring.  I will also be verifying that weekly lab values are appropriate.   I personally oversaw and approved the construction of 2 medically necessary complex treatment devices in the form of unique MLCs which will be used to spare critical normal structures including the heart and lungs.

## 2014-03-12 NOTE — Progress Notes (Signed)
Upon assessment of patient, patient on cardizem drip but noted to be in sinus rhythm with PACs. Performed EKG on patient, confirmed sinus rhythm with PACs with rate in the 60s. Notified triad MD on call who wrote orders to give patient's home dose of metoprolol and wean cardizem drip. Patient informed of plan of care, will continue to monitor.

## 2014-03-12 NOTE — Progress Notes (Signed)
Pt's HR 60s-90s and still in sinus rhythm after giving lopressor and reducing cardizem drip. Notifed Rama, MD who said to turn off cardizem drip. Informed patient of plan of care. Will continue to monitor.

## 2014-03-12 NOTE — Discharge Summary (Signed)
[address]       [place] [address]             [phone]       Deryl Ports [birth year] 52 y.o. 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 is a 53 year old woman sent for consultation regarding a right hilar and mediastinal mass.  [name]  Is a 53 year old woman with a history of hypertension, asthma, and 20+-pack-year history of tobacco abuse. She is currently smoking about one half a pack of cigarettes a day.  She has been losing weight over the past 6 months, but otherwise had been feeling well up until about 2 weeks ago. She had a cough for a few weeks, however that cough worsened and she began experiencing some chest tightness and shortness of breath. Her inhalers did not seem to help. She went to urgent care and a chest x-ray was done which showed a possible right hilar mass and pneumonia. She was treated with Levaquin, with a plan to repeat her chest x-ray and a later time.  She completed her course of Levaquin but was continuing to have worsening symptoms. She was given a second course of Levaquin. She continued to have a persistent cough was having worsening shortness of breath and chest tightness. A repeat chest x-ray again suggested a right hilar mass. A CT of the chest was done on March 26, which showed a large right hilar and mediastinal mass.  She has had a persisting cough. It is productive of yellowish sputum. She has not had any hemoptysis. She has lost 19 pounds over the past 6 months and 12 pounds over the past 3 months, most of that was in the last 2 weeks. Her appetite is poor and she has decreased energy, she attributes these symptoms to her pneumonia. She says that she has a sensation of fullness and tightness in her chest and feels like she cannot get a deep breath.  She is constantly wheezing. She was admitted this hospitalization for bronchoscopy with biopsies, brushings, and needle aspirations.  Past Medical History   Diagnosis  Date   .  Hypertension    .  Allergy    .  Asthma    .  Anxiety     Past Surgical History   Procedure  Laterality  Date   .  Tubal ligation     .  Cesarean section      Family History   Problem  Relation  Age of Onset   .  Heart disease  Mother    .  Hypertension  Mother     Social History  History   Substance Use Topics   .  Smoking status:  Current Every Day Smoker -- 1.00 packs/day for 20 years     Types:  Cigarettes   .  Smokeless tobacco:  Not on file   .  Alcohol Use:  4.8 oz/week     8 Glasses of wine per week    Current Outpatient Prescriptions   Medication  Sig  Dispense  Refill   .  albuterol (PROVENTIL HFA) 108 (90 BASE) MCG/ACT inhaler  Inhale 2 puffs into the lungs every 4 (four) hours as needed for wheezing or shortness of breath.  6.7 each  5   .  albuterol (PROVENTIL) (2.5 MG/3ML) 0.083%  nebulizer solution  Take 3 mLs (2.5 mg total) by nebulization every 6 (six) hours as needed for wheezing.  150 mL  1   .  atorvastatin (LIPITOR) 20 MG tablet  Take 1 tablet (20 mg total) by mouth daily.  90 tablet  0   .  budesonide-formoterol (SYMBICORT) 160-4.5 MCG/ACT inhaler  Inhale 2 puffs into the lungs 2 (two) times daily.     .  chlorpheniramine-HYDROcodone (TUSSIONEX PENNKINETIC ER) 10-8 MG/5ML LQCR  Take 5 mLs by mouth every 12 (twelve) hours as needed.  60 mL  0   .  fluticasone (FLONASE) 50 MCG/ACT nasal spray  Place 2 sprays into both nostrils daily.     Marland Kitchen  levofloxacin (LEVAQUIN) 500 MG tablet  Take 1 tablet (500 mg total) by mouth daily.  5 tablet  0   .  lisinopril (PRINIVIL,ZESTRIL) 10 MG tablet  Take 10 mg by mouth daily.     .  metoprolol succinate (TOPROL-XL) 50 MG 24 hr tablet  Take 1 tablet (50 mg total) by mouth daily. Take with or immediately following a meal.  30 tablet  3        Hospital Course:  The patient was admitted to the hospital and taken to the operating room on 03/11/2014 and underwent Procedure(s): DATE OF PROCEDURE: 03/11/2014   OPERATIVE REPORT  PREOPERATIVE DIAGNOSIS: Right hilar mediastinal mass.  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  PROCEDURE: Bronchoscopy with biopsies, brushings, and needle  aspirations.  SURGEON: Revonda Standard. Roxan Hockey, M.D.  ANESTHESIA: General.  FINDINGS: Large mass with deformity of the carina, endobronchial mass  in the right main stem. Frozen section showed epithelioid neoplasm,  small tumor deposit versus direct invasion from the mediastinum in the  left main stem, severe extrinsic compression of the right mainstem  bronchus, but the bronchoscope was able to pass.  The patient was extubated in the operating room and taken to the  postanesthetic care unit in good condition.   Postoperative hospital course: The patient tolerated the procedure well. In the PACU the patient did have an episode of SVT which responded to carotid massage. She then had sinus rhythm with PACs and subsequently developed atrial fibrillation with RVR and PVCs. Cardiology consultation was obtained she was placed on a Cardizem drip and an echocardiogram was to be obtained. Oncology feels as though she needs to be in chemotherapy and radiation as soon as possible and she is to be transferred to [facility] for further management. She will be continued to be followed as well by cardiology at [facility] long.     Recent Labs  03/11/14 1002 03/12/14 0530  NA 137 138  K 4.9 4.3  CL 98 99  CO2 17* 23  GLUCOSE 82 123*  BUN 13 12  CALCIUM 9.7 9.1    Recent Labs  03/11/14 1002  WBC 9.9  HGB 20.3*  HCT 56.9*  PLT 220    Recent Labs  03/11/14 1125  INR 1.06     Discharge Instructions:  The patient is discharged to home with extensive instructions on wound  care and progressive ambulation.  They are instructed not to drive or perform any heavy lifting until returning to see the physician in his office.  Discharge Diagnosis:  RIGHT HILAR//MEDIASTINAL MASS  Secondary Diagnosis: Patient Active Problem List   Diagnosis Date Noted  . Atrial fibrillation 03/12/2014  . SVT (supraventricular tachycardia) 03/11/2014  . Lung mass 03/10/2014  . HTN (  hypertension) 05/07/2012  . Asthma 05/07/2012   Past Medical History  Diagnosis Date  . Hypertension   . Allergy   . Asthma   . Anxiety    Current facility-administered medications:0.9 %  sodium chloride infusion, 250 mL, Intravenous, PRN, Melrose Nakayama, MD;  0.9 %  sodium chloride infusion, 250 mL, Intravenous, PRN, Donielle Liston Alba, PA-C;  acetaminophen (TYLENOL) suppository 650 mg, 650 mg, Rectal, Q4H PRN, Melrose Nakayama, MD;  acetaminophen (TYLENOL) tablet 650 mg, 650 mg, Oral, Q4H PRN, Melrose Nakayama, MD atorvastatin (LIPITOR) tablet 20 mg, 20 mg, Oral, q1800, Nani Skillern, PA-C, 20 mg at 03/11/14 2023;  budesonide-formoterol (SYMBICORT) 160-4.5 MCG/ACT inhaler 2 puff, 2 puff, Inhalation, BID, Nani Skillern, PA-C, 2 puff at 03/12/14 0914;  chlorpheniramine-HYDROcodone (TUSSIONEX) 10-8 MG/5ML suspension 5 mL, 5 mL, Oral, Q12H PRN, Nani Skillern, PA-C diltiazem (CARDIZEM) 100 mg in dextrose 5 % 100 mL infusion, 5 mg/hr, Intravenous, Titrated, Tarri Fuller, PA-C, Last Rate: 5 mL/hr at 03/12/14 1022, 5 mg/hr at 03/12/14 1022;  fluticasone (FLONASE) 50 MCG/ACT nasal spray 2 spray, 2 spray, Each Nare, Daily, Donielle M Zimmerman, PA-C, 2 spray at 03/12/14 1022;  lactated ringers infusion, , Intravenous, Continuous, Kate Sable, MD levalbuterol Penne Lash) nebulizer solution 0.63 mg, 0.63 mg, Nebulization, Q6H PRN, Nani Skillern, PA-C, 0.63 mg at 03/12/14 0913;  lisinopril (PRINIVIL,ZESTRIL) tablet 10 mg, 10 mg, Oral, Daily, Donielle M Zimmerman, PA-C, 10 mg at  03/12/14 1022;  metoprolol succinate (TOPROL-XL) 24 hr tablet 50 mg, 50 mg, Oral, Daily, Donielle M Zimmerman, PA-C, 50 mg at 03/12/14 1022 ondansetron (ZOFRAN) injection 4 mg, 4 mg, Intravenous, Q6H PRN, Donielle M Zimmerman, PA-C;  ondansetron (ZOFRAN) tablet 4 mg, 4 mg, Oral, Q6H PRN, Donielle M Zimmerman, PA-C;  senna (SENOKOT) tablet 8.6 mg, 1 tablet, Oral, Daily PRN, Nani Skillern, PA-C;  sodium chloride 0.9 % injection 3 mL, 3 mL, Intravenous, Q12H, Melrose Nakayama, MD;  sodium chloride 0.9 % injection 3 mL, 3 mL, Intravenous, PRN, Melrose Nakayama, MD sodium chloride 0.9 % injection 3 mL, 3 mL, Intravenous, Q12H, Donielle M Zimmerman, PA-C;  sodium chloride 0.9 % injection 3 mL, 3 mL, Intravenous, Q12H, Donielle M Zimmerman, PA-C, 3 mL at 03/12/14 1045;  sodium chloride 0.9 % injection 3 mL, 3 mL, Intravenous, PRN, Nani Skillern, PA-C;  traMADol (ULTRAM) tablet 50 mg, 50 mg, Oral, Q6H PRN, Nani Skillern, PA-C  Disposition: For transfer to [facility] long hospital under the management of the hospitalist service. Oncology will manage chemotherapy and radiation treatments. Cardiology we'll continue to follow for her cardiac dysrhythmias.  Patient's condition is Jaclynn Major, PA-C 03/12/2014  10:28 AM

## 2014-03-12 NOTE — Progress Notes (Signed)
Radiation Oncology         [phone]) [phone] ________________________________  Initial inpatient Consultation - Date: 03/12/2014   Name: Brittany [name] MRN: [id number]   DOB: [birth year]  REFERRING PHYSICIAN: Melrose Nakayama, *  DIAGNOSIS: Mediastinal Mass (epithelioid neoplasm per preliminary path, likely small cell. Final pathology pending)  HISTORY OF PRESENT ILLNESS::Brittany [name] is a 53 y.o. female  who notes a 20 pound weight loss. She also had a cough for which she was evaluated with a chest x-ray. This showed a right hilar mass. She was treated with antibiotics. When this did not improve a CT of the chest was performed on 03/05/2014. This showed extensive mediastinal and hilar adenopathy resulting in the narrowing of the right pulmonary artery and occlusion of the right bronchus. This mass extended into the subcarinal region and the left hilum. The mass measured up to 8.1 x 6.5 x 7.3 cm. Postobstructive pneumonitis was noted. No evidence of metastatic disease was noted in the remainder of the exam. She has not had staging studies. She underwent bronchoscopy and biopsy yesterday which showed a extrinsic mass at the carina with a large mass protruding into the right mainstem bronchus. This was also noted to be almost completely compressed. The upper lobe bronchus was not visualized. Distal branches of the left bronchus were also not able to be visualized. Biopsy showed an epithelioid neoplasm. Final pathology is pending. She was presented at multiple splenic tumor conference this morning and the recommendation was for urgent radiation and chemotherapy.  She reports that other than weight loss and some cough she really feels fine. She notes no hemoptysis chest pain headaches or any bone pain.  PREVIOUS RADIATION THERAPY: No  PAST MEDICAL HISTORY:  has a past medical history of Hypertension; Allergy; Asthma; and Anxiety.    PAST SURGICAL HISTORY: Past Surgical History    Procedure Laterality Date  . Tubal ligation    . Cesarean section    . Video bronchoscopy with endobronchial ultrasound N/A 03/11/2014    Procedure: VIDEO BRONCHOSCOPY WITH ENDOBRONCHIAL ULTRASOUND;  Surgeon: Melrose Nakayama, MD;  Location: [place];  Service: Thoracic;  Laterality: N/A;  . Mediastinoscopy N/A 03/11/2014    Procedure: MEDIASTINOSCOPY;  Surgeon: Melrose Nakayama, MD;  Location: [facility];  Service: Thoracic;  Laterality: N/A;    FAMILY HISTORY:  Family History  Problem Relation Age of Onset  . Heart disease Mother   . Hypertension Mother     SOCIAL HISTORY:  History  Substance Use Topics  . Smoking status: Current Every Day Smoker -- 1.00 packs/day for 20 years    Types: Cigarettes  . Smokeless tobacco: Not on file  . Alcohol Use: 4.8 oz/week    8 Glasses of wine per week    ALLERGIES: Codeine; Iodinated diagnostic agents; and Sulfa antibiotics  MEDICATIONS:  No current facility-administered medications for this encounter.   No current outpatient prescriptions on file.   Facility-Administered Medications Ordered in Other Encounters  Medication Dose Route Frequency Provider Last Rate Last Dose  . 0.9 %  sodium chloride infusion  250 mL Intravenous PRN Melrose Nakayama, MD      . 0.9 %  sodium chloride infusion  250 mL Intravenous PRN Donielle Liston Alba, PA-C      . acetaminophen (TYLENOL) tablet 650 mg  650 mg Oral Q4H PRN Melrose Nakayama, MD       Or  . acetaminophen (TYLENOL) suppository 650 mg  650 mg Rectal Q4H PRN Melrose Nakayama, MD      .  atorvastatin (LIPITOR) tablet 20 mg  20 mg Oral q1800 Nani Skillern, PA-C   20 mg at 03/11/14 2023  . budesonide-formoterol (SYMBICORT) 160-4.5 MCG/ACT inhaler 2 puff  2 puff Inhalation BID Nani Skillern, PA-C   2 puff at 03/12/14 0914  . chlorpheniramine-HYDROcodone (TUSSIONEX) 10-8 MG/5ML suspension 5 mL  5 mL Oral Q12H PRN Donielle Liston Alba, PA-C      . diltiazem (CARDIZEM) 100 mg  in dextrose 5 % 100 mL infusion  5 mg/hr Intravenous Titrated Tarri Fuller, PA-C 5 mL/hr at 03/12/14 1022 5 mg/hr at 03/12/14 1022  . fluticasone (FLONASE) 50 MCG/ACT nasal spray 2 spray  2 spray Each Nare Daily Nani Skillern, PA-C   2 spray at 03/12/14 1022  . lactated ringers infusion   Intravenous Continuous Kate Sable, MD      . levalbuterol [facility]) nebulizer solution 0.63 mg  0.63 mg Nebulization Q6H PRN Nani Skillern, PA-C   0.63 mg at 03/12/14 0913  . lisinopril (PRINIVIL,ZESTRIL) tablet 10 mg  10 mg Oral Daily Donielle Liston Alba, PA-C   10 mg at 03/12/14 1022  . metoprolol succinate (TOPROL-XL) 24 hr tablet 50 mg  50 mg Oral Daily Donielle M Zimmerman, PA-C      . ondansetron [facility]) tablet 4 mg  4 mg Oral Q6H PRN Nani Skillern, PA-C       Or  . ondansetron [facility]) injection 4 mg  4 mg Intravenous Q6H PRN Donielle Liston Alba, PA-C      . senna (SENOKOT) tablet 8.6 mg  1 tablet Oral Daily PRN Donielle Liston Alba, PA-C      . sodium chloride 0.9 % injection 3 mL  3 mL Intravenous Q12H Melrose Nakayama, MD      . sodium chloride 0.9 % injection 3 mL  3 mL Intravenous PRN Melrose Nakayama, MD      . sodium chloride 0.9 % injection 3 mL  3 mL Intravenous Q12H Donielle Liston Alba, PA-C      . sodium chloride 0.9 % injection 3 mL  3 mL Intravenous Q12H Donielle Liston Alba, PA-C   3 mL at 03/12/14 1045  . sodium chloride 0.9 % injection 3 mL  3 mL Intravenous PRN Donielle Liston Alba, PA-C      . traMADol [facility]) tablet 50 mg  50 mg Oral Q6H PRN Nani Skillern, PA-C        REVIEW OF SYSTEMS:  A 15 point review of systems is documented in the electronic medical record. This was obtained by the nursing staff. However, I reviewed this with the patient to discuss relevant findings and make appropriate changes.  A comprehensive review of systems was negative.  PHYSICAL EXAM: There were no vitals filed for this visit.. . She is a pleasant female lying in  a hospital bed. She is alert minus x3. She has normal respiratory effort.  LABORATORY DATA:  Lab Results  Component Value Date   WBC 9.9 03/11/2014   HGB 20.3* 03/11/2014   HCT 56.9* 03/11/2014   MCV 97.8 03/11/2014   PLT 220 03/11/2014   Lab Results  Component Value Date   NA 138 03/12/2014   K 4.3 03/12/2014   CL 99 03/12/2014   CO2 23 03/12/2014   Lab Results  Component Value Date   ALT 26 03/11/2014   AST 31 03/11/2014   ALKPHOS 110 03/11/2014   BILITOT 0.6 03/11/2014     RADIOGRAPHY:  Ct Angio Chest Pe W/cm &/  or Wo Cm  03/05/2014   CLINICAL DATA:  Shortness of breath.  EXAM: CT ANGIOGRAPHY CHEST WITH CONTRAST  TECHNIQUE: Multidetector CT imaging of the chest was performed using the standard protocol during bolus administration of intravenous contrast. Multiplanar CT image reconstructions and MIPs were obtained to evaluate the vascular anatomy.  CONTRAST:  66mL OMNIPAQUE IOHEXOL 350 MG/ML SOLN  COMPARISON:  DG CHEST 2 VIEW dated 03/05/2014; DG CHEST 2V dated 03/04/2014; DG CHEST 2V dated 02/24/2014  FINDINGS: Thoracic aorta unremarkable. No evidence of pulmonary embolus. There is narrowing of the right pulmonary artery secondary to large mediastinal and hilar mass. Cardiomegaly. Small pericardial effusion.  Extensive mediastinal and hilar adenopathy and/or mass lesion is present. This results in narrowing of the right pulmonary artery and near complete occlusion of the right bronchus. The mass extends into the subcarinal region and into the left hilum. Associated left hilar adenopathy is most likely present. The mass conglomerate measures up to 8.1 x 6.5 x 7.3 cm. Visualized portions of the thoracic esophagus unremarkable.  Trachea and left mainstem bronchus are patent. Again there is near-complete occlusion of the right mainstem bronchus. Infiltrative changes are present in the right middle and right lower lobes. These changes are most likely related to postobstructive pneumonitis. No pneumothorax. No  significant pleural effusion.  Adrenals normal.  Visualized upper abdominal viscera unremarkable.  Punctate lucency in the right thyroid lobe, most likely tiny cysts. Shotty bilateral axillary lymph nodes. Breast tissue is dense with asymmetric densities for which further evaluation with mammography suggested. No focal bony abnormalities identified.  Review of the MIP images confirms the above findings.  IMPRESSION: 1. Large mediastinal and right hilar mass lesions with narrowing of the right pulmonary artery and near complete occlusion of the right mainstem bronchus. These changes are most likely malignant and related to a process such as primary lung cancer with metastatic lymphadenopathy, metastatic adenopathy from other primary, or lymphoma. PET-CT can be obtained for further evaluation. 2. No evidence of pulmonary embolus. Again the right pulmonary artery is narrowed secondary to the above described mass lesion. 3. Postobstructive right middle lobe and right lower lobe pneumonia. Again the right mainstem bronchus is almost completely occluded by the above-described mass. 4. Breasts are bilaterally dense with asymmetric densities, further evaluation with mammography suggested. 5. Cardiomegaly with small pericardial effusion.   Electronically Signed   By: Marcello Moores  Register   On: 03/05/2014 15:53     IMPRESSION: Mediastinal mass with right mainstem bronchus near complete occlusion and occlusion of several segmental branches of the left bronchus. Pathology pending but likely small cell carcinoma  PLAN:   1- Radiation to mediastinum to prevent airway collapse given CT and bronchoscopy findings. I will treat her with 5 gray in 1 fraction and we'll plan on coming back with consolidative chemoradiation when she has completed a few cycles of chemotherapy. At this point the amount of disease would preclude treatment of all disease to an adequate dose. Patient has been consented to the possibility of esophagitis and  damage to critical normal structures within the chest. She will also be evaluated by medical oncology.  2- complete staging with CT of the abdomen and pelvis, bone scan, and MRI of the brain. 3- Evaluation by medical oncology pending 4- Dietary consult.     I spent 40 minutes  face to face with the patient and more than 50% of that time was spent in counseling and/or coordination of care.   ------------------------------------------------  [name], MD

## 2014-03-12 NOTE — Progress Notes (Signed)
Patient HR still ranging from 47-140's, SR with multiple PAC's. Patient asymptomatic. Cardiology updated on current status.  Will continue to monitor.

## 2014-03-12 NOTE — Addendum Note (Signed)
Addendum created 03/12/14 1150 by Josephine Igo, CRNA   Modules edited: Anesthesia Flowsheet

## 2014-03-12 NOTE — Progress Notes (Signed)
[place] [phone]   [phone]  CONSULT NOTE  REFERRING PHYSICIAN: Dr. Modesto Charon  REASON FOR CONSULTATION:  53 years old female with questionable lung cancer.  HPI Brittany [name] is a 53 y.o. female with past medical history significant for hypertension, allergy and anxiety as well as long history of smoking. The patient was recently admitted to [facility] complaining of worsening dyspnea. She has been complaining of shortness of breath for the last 2 weeks but was getting worse. Chest exudate on 02/24/2014 showed right hilar prominence concerning for neoplasm or malignancy with postobstructive atelectasis. CT angiogram of the chest performed on 03/05/2017 showed extensive mediastinal and hilar adenopathy and/or mass lesion is present with narrowing of the right pulmonary artery and near complete occlusion of the right bronchus. The mass extends into the subcarinal region and into the left hilum. There was associated left hilar adenopathy present. There was also a mass conglomerate measures up to 8.1 x 6.5 x 7.3 CM. These changes are most likely malignant and related to a process such as primary lung cancer with metastatic lymphadenopathy. Metastatic adenopathy from other malignancy or lymphoma is also a consideration. The patient was seen by Dr. Roxan Hockey and on 03/11/2014 she underwent bronchoscopy with biopsies, brushings and needle aspiration of the right hilar mass. 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 The patient was transferred to [facility] for further evaluation and recommendation regarding treatment of her condition. The final pathology is still pending.  When seen today the patient is feeling fine except for shortness breath with exertion and wheezes but she denied having any significant chest pain, cough or hemoptysis. The patient denied having any nausea or  vomiting, no fever or chills. She lost around 17 pounds on the lost few weeks. She denied having any headache or blurry vision.  HPI  Past Medical History  Diagnosis Date  . Hypertension   . Allergy   . Asthma   . Anxiety     Past Surgical History  Procedure Laterality Date  . Tubal ligation    . Cesarean section    . Video bronchoscopy with endobronchial ultrasound N/A 03/11/2014    Procedure: VIDEO BRONCHOSCOPY WITH ENDOBRONCHIAL ULTRASOUND;  Surgeon: Melrose Nakayama, MD;  Location: [place];  Service: Thoracic;  Laterality: N/A;  . Mediastinoscopy N/A 03/11/2014    Procedure: MEDIASTINOSCOPY;  Surgeon: Melrose Nakayama, MD;  Location: [facility] OR;  Service: Thoracic;  Laterality: N/A;    Family History  Problem Relation Age of Onset  . Heart disease Mother   . Hypertension Mother     Social History History  Substance Use Topics  . Smoking status: Current Every Day Smoker -- 1.00 packs/day for 20 years    Types: Cigarettes  . Smokeless tobacco: Not on file  . Alcohol Use: 4.8 oz/week    8 Glasses of wine per week    Allergies  Allergen Reactions  . Codeine Nausea And Vomiting  . Iodinated Diagnostic Agents Hives and Rash  . Sulfa Antibiotics Hives and Rash    Current Facility-Administered Medications  Medication Dose Route Frequency Provider Last Rate Last Dose  . 0.9 %  sodium chloride infusion  250 mL Intravenous PRN Melrose Nakayama, MD      . 0.9 %  sodium chloride infusion  250 mL Intravenous PRN Donielle Liston Alba, PA-C      . acetaminophen (TYLENOL) tablet 650 mg  650 mg Oral Q4H PRN  Melrose Nakayama, MD       Or  . acetaminophen (TYLENOL) suppository 650 mg  650 mg Rectal Q4H PRN Melrose Nakayama, MD      . atorvastatin (LIPITOR) tablet 20 mg  20 mg Oral q1800 Nani Skillern, PA-C   20 mg at 03/11/14 2023  . budesonide-formoterol (SYMBICORT) 160-4.5 MCG/ACT inhaler 2 puff  2 puff Inhalation BID Nani Skillern, PA-C   2 puff at  03/12/14 0914  . chlorpheniramine-HYDROcodone (TUSSIONEX) 10-8 MG/5ML suspension 5 mL  5 mL Oral Q12H PRN Donielle Liston Alba, PA-C      . diltiazem (CARDIZEM) 100 mg in dextrose 5 % 100 mL infusion  5 mg/hr Intravenous Titrated Tarri Fuller, PA-C 5 mL/hr at 03/12/14 1022 5 mg/hr at 03/12/14 1022  . fluticasone (FLONASE) 50 MCG/ACT nasal spray 2 spray  2 spray Each Nare Daily Nani Skillern, PA-C   2 spray at 03/12/14 1022  . lactated ringers infusion   Intravenous Continuous Kate Sable, MD      . levalbuterol [facility]) nebulizer solution 0.63 mg  0.63 mg Nebulization Q6H PRN Nani Skillern, PA-C   0.63 mg at 03/12/14 0913  . lisinopril (PRINIVIL,ZESTRIL) tablet 10 mg  10 mg Oral Daily Donielle Liston Alba, PA-C   10 mg at 03/12/14 1022  . metoprolol succinate (TOPROL-XL) 24 hr tablet 50 mg  50 mg Oral Daily Donielle M Zimmerman, PA-C      . ondansetron [facility]) tablet 4 mg  4 mg Oral Q6H PRN Nani Skillern, PA-C       Or  . ondansetron [facility]) injection 4 mg  4 mg Intravenous Q6H PRN Donielle Liston Alba, PA-C      . senna (SENOKOT) tablet 8.6 mg  1 tablet Oral Daily PRN Donielle Liston Alba, PA-C      . sodium chloride 0.9 % injection 3 mL  3 mL Intravenous Q12H Melrose Nakayama, MD      . sodium chloride 0.9 % injection 3 mL  3 mL Intravenous PRN Melrose Nakayama, MD      . sodium chloride 0.9 % injection 3 mL  3 mL Intravenous Q12H Donielle Liston Alba, PA-C      . sodium chloride 0.9 % injection 3 mL  3 mL Intravenous Q12H Donielle Liston Alba, PA-C   3 mL at 03/12/14 1045  . sodium chloride 0.9 % injection 3 mL  3 mL Intravenous PRN Donielle M Zimmerman, PA-C      . traMADol Veat[facility] Bourbon) tablet 50 mg  50 mg Oral Q6H PRN Nani Skillern, PA-C        Review of Systems  Constitutional: negative Eyes: negative Ears, nose, mouth, throat, and face: negative Respiratory: positive for dyspnea on exertion Gastrointestinal:  negative Genitourinary:negative Integument/breast: negative Hematologic/lymphatic: negative Musculoskeletal:negative Neurological: negative Behavioral/Psych: negative Endocrine: negative Allergic/Immunologic: negative  Physical Exam  XHB:ZJIRC, healthy, no distress, well nourished and well developed SKIN: skin color, texture, turgor are normal, no rashes or significant lesions HEAD: Normocephalic, No masses, lesions, tenderness or abnormalities EYES: PERRLA EARS: External ears normal, Canals clear OROPHARYNX:no exudate, no erythema and lips, buccal mucosa, and tongue normal  NECK: supple, no adenopathy, no JVD LYMPH:  no palpable lymphadenopathy, no hepatosplenomegaly BREAST:not examined LUNGS: clear to auscultation , and palpation HEART: regular rate & rhythm, no murmurs and no gallops ABDOMEN:abdomen soft, non-tender, normal bowel sounds and no masses or organomegaly BACK: Back symmetric, no curvature., No CVA tenderness EXTREMITIES:no joint deformities, effusion, or inflammation,  no edema, no skin discoloration  NEURO: alert & oriented x 3 with fluent speech, no focal motor/sensory deficits  PERFORMANCE STATUS: ECOG 1  LABORATORY DATA: Lab Results  Component Value Date   WBC 9.9 03/11/2014   HGB 20.3* 03/11/2014   HCT 56.9* 03/11/2014   MCV 97.8 03/11/2014   PLT 220 03/11/2014    @LASTCHEM @  RADIOGRAPHIC STUDIES: Chest 2 View  03/11/2014   CLINICAL DATA:  Known hilar and mediastinal mass  EXAM: CHEST  2 VIEW  COMPARISON:  03/05/2014  FINDINGS: Cardiac shadow is stable. The lungs are well aerated bilaterally. The left lung is clear. The known right hilar/ mediastinal mass is again identified. No definitive infiltrate is identified.  IMPRESSION: Stable right hilar/ mediastinal mass.   Electronically Signed   By: Inez Catalina M.D.   On: 03/11/2014 11:52   Dg Chest 2 View  03/05/2014   CLINICAL DATA:  Shortness of breath, chest pain  EXAM: CHEST  2 VIEW  COMPARISON:  March 04, 2014   FINDINGS: The previously noted right perihilar mass with associated postobstructive bronchopneumonia is unchanged. There is no pleural effusion. There is no pulmonary edema. The mediastinal contour and cardiac silhouette are stable. The soft tissues and osseous structures are normal.  IMPRESSION: Previously noted right perihilar mass with associated postobstructive bronchopneumonia is unchanged.   Electronically Signed   By: Abelardo Diesel M.D.   On: 03/05/2014 12:18   Dg Chest 2 View  03/04/2014   CLINICAL DATA:  Shortness of breath, history of smoking  EXAM: CHEST  2 VIEW  COMPARISON:  DG CHEST 2V dated 02/24/2014; DG CHEST 2 VIEW dated 11/22/2011; DG CHEST 2 VIEW dated 05/25/2008  FINDINGS: Grossly unchanged cardiac silhouette and mediastinal contours with similar appearance of previously identified large right-sided perihilar mass with associated heterogeneous opacities radiating peripherally along the right major and minor fissures. No new focal airspace opacities. No pleural effusion or pneumothorax. No evidence of edema.  IMPRESSION: Grossly unchanged appearance of previously identified large right-sided perihilar mass with associated postobstructive infection. These findings are again worrisome for bronchogenic carcinoma and further evaluation with chest CT is again recommended.  These results will be called to the ordering clinician or representative by the Radiologist Assistant, and communication documented in the PACS Dashboard.   Electronically Signed   By: Sandi Mariscal M.D.   On: 03/04/2014 15:15   Dg Chest 2 View  02/24/2014   CLINICAL DATA:  Shortness of breath.  EXAM: CHEST  2 VIEW  COMPARISON:  November 22, 2011.  FINDINGS: Cardiac size is within normal limits. There is right hilar prominence which is increased compared to prior exam with associated opacity consistent with atelectasis or possibly pneumonia. Left lung is clear. No pneumothorax or pleural effusion is noted. Bony thorax is intact.   IMPRESSION: Right hilar prominence is noted concerning for neoplasm or malignancy with postobstructive atelectasis. CT scan of the chest with contrast is recommended for further evaluation. These results will be called to the ordering clinician or representative by the Radiologist Assistant, and communication documented in the PACS Dashboard.   Electronically Signed   By: Sabino Dick M.D.   On: 02/24/2014 16:39   Ct Angio Chest Pe W/cm &/or Wo Cm  03/05/2014   CLINICAL DATA:  Shortness of breath.  EXAM: CT ANGIOGRAPHY CHEST WITH CONTRAST  TECHNIQUE: Multidetector CT imaging of the chest was performed using the standard protocol during bolus administration of intravenous contrast. Multiplanar CT image reconstructions and MIPs were obtained  to evaluate the vascular anatomy.  CONTRAST:  38mL OMNIPAQUE IOHEXOL 350 MG/ML SOLN  COMPARISON:  DG CHEST 2 VIEW dated 03/05/2014; DG CHEST 2V dated 03/04/2014; DG CHEST 2V dated 02/24/2014  FINDINGS: Thoracic aorta unremarkable. No evidence of pulmonary embolus. There is narrowing of the right pulmonary artery secondary to large mediastinal and hilar mass. Cardiomegaly. Small pericardial effusion.  Extensive mediastinal and hilar adenopathy and/or mass lesion is present. This results in narrowing of the right pulmonary artery and near complete occlusion of the right bronchus. The mass extends into the subcarinal region and into the left hilum. Associated left hilar adenopathy is most likely present. The mass conglomerate measures up to 8.1 x 6.5 x 7.3 cm. Visualized portions of the thoracic esophagus unremarkable.  Trachea and left mainstem bronchus are patent. Again there is near-complete occlusion of the right mainstem bronchus. Infiltrative changes are present in the right middle and right lower lobes. These changes are most likely related to postobstructive pneumonitis. No pneumothorax. No significant pleural effusion.  Adrenals normal.  Visualized upper abdominal viscera  unremarkable.  Punctate lucency in the right thyroid lobe, most likely tiny cysts. Shotty bilateral axillary lymph nodes. Breast tissue is dense with asymmetric densities for which further evaluation with mammography suggested. No focal bony abnormalities identified.  Review of the MIP images confirms the above findings.  IMPRESSION: 1. Large mediastinal and right hilar mass lesions with narrowing of the right pulmonary artery and near complete occlusion of the right mainstem bronchus. These changes are most likely malignant and related to a process such as primary lung cancer with metastatic lymphadenopathy, metastatic adenopathy from other primary, or lymphoma. PET-CT can be obtained for further evaluation. 2. No evidence of pulmonary embolus. Again the right pulmonary artery is narrowed secondary to the above described mass lesion. 3. Postobstructive right middle lobe and right lower lobe pneumonia. Again the right mainstem bronchus is almost completely occluded by the above-described mass. 4. Breasts are bilaterally dense with asymmetric densities, further evaluation with mammography suggested. 5. Cardiomegaly with small pericardial effusion.   Electronically Signed   By: Marcello Moores  Register   On: 03/05/2014 15:53   Dg Chest Port 1 View  03/11/2014   CLINICAL DATA:  Status post bronchoscopy  EXAM: PORTABLE CHEST - 1 VIEW  COMPARISON:  Film from earlier in the same day.  FINDINGS: Prominent right perihilar density is noted consistent with the known history of mediastinal mass. No pneumothorax or pleural effusion is seen. No focal infiltrate is noted.  IMPRESSION: No evidence of pneumothorax following bronchoscopy.   Electronically Signed   By: Inez Catalina M.D.   On: 03/11/2014 16:43    ASSESSMENT: This is a very pleasant 53 years old female with questionable locally advanced lung cancer highly suspicious for small cell carcinoma but the final pathology is still pending.   PLAN: I had a lengthy discussion  with the patient today about his current condition and treatment options. I would wait for the final pathology before giving recommendation regarding her systemic chemotherapy. If the final pathology is consistent with small cell carcinoma, the patient would be treated with chemotherapy in the form of cisplatin and etoposide. I will complete the staging workup by ordering MRI of the brain as well as CT scan of the abdomen and pelvis. The patient may also benefit from a PET scan but this can be preformed on outpatient basis after discharge. The patient was also seen by Dr. Pablo Ledger and she is considered for short course of palliative  radiotherapy. If the patient is discharged over the weekend, I will arrange for her to follow up visit with me in one week for evaluation and discussion of her treatment options.  The patient voices understanding of current disease status and treatment options and is in agreement with the current care plan.  All questions were answered. The patient knows to call the clinic with any problems, questions or concerns. We can certainly see the patient much sooner if necessary.  Thank you so much for allowing me to participate in the care of Brittany [name]. I will continue to follow up the patient with you and assist in her care.   Disclaimer: This note was dictated with voice recognition software. Similar sounding words can inadvertently be transcribed and may not be corrected upon review.   Marqueze Ramcharan K. 03/12/2014, 4:45 PM

## 2014-03-12 NOTE — Progress Notes (Addendum)
      [address]       [place] [address]             [phone]       1 Day Post-Op Procedure(s) (LRB): VIDEO BRONCHOSCOPY WITH ENDOBRONCHIAL ULTRASOUND (N/A) MEDIASTINOSCOPY (N/A)  Subjective: Patient with a cough and feels heart racing at times.  Objective: Vital signs in last 24 hours: Temp:  [97 F (36.1 C)-98.9 F (37.2 C)] 97 F (36.1 C) (04/02 0442) Pulse Rate:  [59-132] 59 (04/02 0442) Cardiac Rhythm:  [-] Normal sinus rhythm;Sinus tachycardia;Supraventricular tachycardia (04/01 2000) Resp:  [0-27] 18 (04/02 0442) BP: (113-174)/(72-105) 120/72 mmHg (04/02 0442) SpO2:  [91 %-98 %] 96 % (04/02 0442) Weight:  [117 lb 1 oz (53.1 kg)-120 lb 2.4 oz (54.5 kg)] 120 lb 2.4 oz (54.5 kg) (04/01 1814)     Intake/Output from previous day: 04/01 0701 - 04/02 0700 In: 1254.8 [P.O.:320; I.V.:934.8] Out: -    Physical Exam:  Cardiovascular: IRRR Pulmonary: Wheezing. Abdomen: Soft, non tender, bowel sounds present. Extremities: SCDs in place   Lab Results: CBC: Recent Labs  03/11/14 1002  WBC 9.9  HGB 20.3*  HCT 56.9*  PLT 220   BMET:  Recent Labs  03/11/14 1002 03/12/14 0530  NA 137 138  K 4.9 4.3  CL 98 99  CO2 17* 23  GLUCOSE 82 123*  BUN 13 12  CREATININE 0.52 0.67  CALCIUM 9.7 9.1    PT/INR:  Recent Labs  03/11/14 1125  LABPROT 13.6  INR 1.06   ABG:  INR: Will add last result for INR, ABG once components are confirmed Will add last 4 CBG results once components are confirmed  Assessment/Plan:  1. CV - SR/PVCs/NSVT/A fib.Await echo. On Toprol XL 50 daily and Lisinopril 10 daily. Likely give Cardizem. Cardiology evaluating. 2.  Pulmonary - right hilar mass. S/p bronchoscopy with biopsies/brushings/needle aspiration. Oncology/radiation oncology hopefully will see today. Will likely be transferred to [facility] today.   ZIMMERMAN,DONIELLE MPA-C 03/12/2014,8:12 AM  Discussed case at [facility]. Dr. Julien Nordmann and Pablo Ledger  both agree that she needs to start chemo and radiation as soon as possible. They requested that we transfer her to [facility] today. I spoke with Dr. Wendee Beavers of [facility] and he agrees to accept her in transfer.  Discussed the plan with Mrs. Mickeal Needy and she is in agreement.

## 2014-03-13 ENCOUNTER — Inpatient Hospital Stay (HOSPITAL_COMMUNITY): Payer: BC Managed Care – PPO

## 2014-03-13 ENCOUNTER — Ambulatory Visit: Payer: BC Managed Care – PPO | Admitting: Radiation Oncology

## 2014-03-13 ENCOUNTER — Ambulatory Visit
Admit: 2014-03-13 | Discharge: 2014-03-13 | Disposition: A | Discharge status: Home / Self Care | Payer: BC Managed Care – PPO | Attending: Radiation Oncology | Admitting: Radiation Oncology

## 2014-03-13 ENCOUNTER — Encounter: Payer: Self-pay | Admitting: Radiation Oncology

## 2014-03-13 DIAGNOSIS — R918 Other nonspecific abnormal finding of lung field: Secondary | ICD-10-CM

## 2014-03-13 MED ORDER — BOOST / RESOURCE BREEZE PO LIQD
1.0000 | Freq: Two times a day (BID) | ORAL | Status: DC
Start: 2014-03-13 — End: 2014-03-14
  Administered 2014-03-13 – 2014-03-14 (×2): 1 via ORAL

## 2014-03-13 MED ORDER — GADOBENATE DIMEGLUMINE 529 MG/ML IV SOLN
10.0000 mL | Freq: Once | INTRAVENOUS | Status: AC | PRN
  Administered 2014-03-13: 10 mL via INTRAVENOUS

## 2014-03-13 MED ORDER — DILTIAZEM HCL ER COATED BEADS 120 MG PO CP24
120.0000 mg | ORAL_CAPSULE | Freq: Every day | ORAL | Status: DC
  Administered 2014-03-13 – 2014-03-14 (×2): 120 mg via ORAL
  Filled 2014-03-13 (×3): qty 1

## 2014-03-13 NOTE — Progress Notes (Signed)
     SUBJECTIVE: No chest pain or SOB this am.   Tele: sinus with PACs this am. Several short runs of atrial fib/atrial tach overnight.   BP 157/90  Pulse 73  Temp(Src) 98.1 F (36.7 C) (Oral)  Resp 18  Ht 4\' 11"  (1.499 m)  Wt 117 lb 15.1 oz (53.5 kg)  BMI 23.81 kg/m2  SpO2 93%  Intake/Output Summary (Last 24 hours) at 03/13/14 0657 Last data filed at 03/12/14 2300  Gross per 24 hour  Intake 295.29 ml  Output      0 ml  Net 295.29 ml    PHYSICAL EXAM General: Well developed, well nourished, in no acute distress. Alert and oriented x 3.  Psych:  Good affect, responds appropriately Neck: No JVD. No masses noted.  Lungs: Clear bilaterally with no wheezes or rhonci noted.  Heart: RRR with no murmurs noted. Abdomen: Bowel sounds are present. Soft, non-tender.  Extremities: No lower extremity edema.   LABS: Basic Metabolic Panel:  Recent Labs  03/11/14 1002 03/12/14 0530  NA 137 138  K 4.9 4.3  CL 98 99  CO2 17* 23  GLUCOSE 82 123*  BUN 13 12  CREATININE 0.52 0.67  CALCIUM 9.7 9.1   CBC:  Recent Labs  03/11/14 1002  WBC 9.9  HGB 20.3*  HCT 56.9*  MCV 97.8  PLT 220    Current Meds: . atorvastatin  20 mg Oral q1800  . budesonide-formoterol  2 puff Inhalation BID  . fluticasone  2 spray Each Nare Daily  . lisinopril  10 mg Oral Daily  . metoprolol succinate  50 mg Oral Daily  . sodium chloride  3 mL Intravenous Q12H  . sodium chloride  3 mL Intravenous Q12H  . sodium chloride  3 mL Intravenous Q12H   Echo 03/12/14: Left ventricle: The cavity size was normal. Wall thickness was increased in a pattern of moderate LVH. Systolic function was normal. The estimated ejection fraction was in the range of 50% to 55%. Wall motion was normal; there were no regional wall motion abnormalities. Doppler parameters are consistent with abnormal left ventricular relaxation (grade 1 diastolic dysfunction). - Pericardium, extracardiac: A trivial pericardial  effusion was identified. Impressions:  - Possible mass compressing right pulmonary artery on parasternal short axis views; suggest CT to further assess.  ASSESSMENT AND PLAN: 53 year old woman with a history of hypertension, asthma, and 20+-pack-year history of tobacco abuse with right hilar mass, likely small cell. She was undergoing bronchoscopy biopsy on 03/11/14 and had run of atrial tachycardia in the PACU. This was felt to be SVT but telemetry on 03/12/14 with possible atrial fib. Transferred to [facility] 03/12/14 for evaluation by Rad/Onc and Oncology teams. She will need palliative radiation therapy before beginning chemotherapy.   1. Atrial fibrillation: She is having paroxysmal atrial fibrillation vs atrial tach. She has been weaned off of her Cardizem drip last night but still having short runs of atrial arrythmias. Will begin oral Cardizem today. Anticoagulation on hold while planning is underway for treatment of her lung mass.   MCALHANY,CHRISTOPHER  4/3/20156:57 AM

## 2014-03-13 NOTE — Progress Notes (Signed)
Chaplain made a follow up visit to see the patient. Chaplain offered ministry of presence and emotional support. Chaplain will follow up.   03/13/14 1500  Clinical Encounter Type  Visited With Patient  Visit Type Initial

## 2014-03-13 NOTE — Progress Notes (Signed)
Chaplain received referral to visit the patient. Patient was being seen by doctor. Chaplain will follow up.    03/13/14 1400  Clinical Encounter Type  Visited With Patient not available  Visit Type Initial

## 2014-03-13 NOTE — Addendum Note (Signed)
Encounter addended by: Deirdre Evener, RN on: 03/13/2014  6:13 PM<BR>     Documentation filed: Visit Diagnoses, Charges VN

## 2014-03-13 NOTE — Progress Notes (Signed)
INITIAL NUTRITION ASSESSMENT  Pt meets criteria for severe MALNUTRITION in the context of chronic illness as evidenced by <75% estimated energy intake in the past month with 9.3% weight loss in the past 3 months.  DOCUMENTATION CODES Per approved criteria  -Severe malnutrition in the context of chronic illness   INTERVENTION: - Resource Breeze BID - Encouraged increased meal intake - Will continue to monitor   NUTRITION DIAGNOSIS: Inadequate oral intake related to poor appetite as evidenced by pt report.   Goal: Pt to consume >90% of meals/supplements  Monitor:  Weights, labs, intake  Reason for Assessment: Malnutrition screening tool   53 y.o. female  Admitting Dx: Atrial fibrillation  ASSESSMENT: Pt is a 53 year old woman with a history of tobacco abuse who presents with progressive cough and dyspnea. She also has been having problems with her appetite and has had significant weight loss. Workup reveals a large right hilar and mediastinal mass with near complete occlusion of the right mainstem along with narrowing of the right pulmonary artery. 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 Pt needed a biopsy urgently so that appropriate treatment can be initiated. Had video bronchoscopy with biopsies on 03/11/14. During/post bronchoscopy today with Dr. Roxan Hockey patient developed arrhythmia, cardiology following. Found to have atrial fibrillation.   Met with pt who reports usually eating 3 meals/day with good appetite however since getting pneumonia the middle of last month her appetite has gone down. Had some nausea during the procedure which is now gone. Thinks her appetite is better today. Pt reports 19 pound unintended weight loss over the past 6 months and 12 pounds over the past 3 months, most of that was in the last 2 weeks. Cannot tolerate nutritional supplements or foods that contain calcium. Agreeable to  trying [employer] for additional nutrition, will order. Was about to eat some lunch and was hungry, did not perform nutrition focused physical exam.     Height: Ht Readings from Last 1 Encounters:  03/12/14 4' 11"  (1.499 m)    Weight: Wt Readings from Last 1 Encounters:  03/13/14 117 lb 15.1 oz (53.5 kg)    Ideal Body Weight: 98 lbs  % Ideal Body Weight: 119%  Wt Readings from Last 10 Encounters:  03/13/14 117 lb 15.1 oz (53.5 kg)  03/13/14 117 lb 15.1 oz (53.5 kg)  03/10/14 117 lb (53.071 kg)  03/05/14 117 lb (53.071 kg)  03/04/14 115 lb (52.164 kg)  02/24/14 117 lb 6.4 oz (53.252 kg)  12/30/13 123 lb (55.792 kg)  01/10/13 129 lb (58.514 kg)  05/07/12 125 lb (56.7 kg)    Usual Body Weight: 129 lbs per pt  % Usual Body Weight: 91%  BMI:  Body mass index is 23.81 kg/(m^2).  Estimated Nutritional Needs: Kcal: 1400-1600 Protein: 65-80g Fluid: 1.4-1.6L/day  Skin: WNL  Diet Order: Cardiac  EDUCATION NEEDS: -No education needs identified at this time   Intake/Output Summary (Last 24 hours) at 03/13/14 1354 Last data filed at 03/13/14 1053  Gross per 24 hour  Intake 895.29 ml  Output      0 ml  Net 895.29 ml    Last BM: 4/1  Labs:   Recent Labs Lab 03/11/14 1002 03/12/14 0530  NA 137 138  K 4.9 4.3  CL 98 99  CO2 17* 23  BUN 13 12  CREATININE 0.52 0.67  CALCIUM 9.7 9.1  GLUCOSE 82 123*    CBG (last  3)  No results found for this basename: GLUCAP,  in the last 72 hours  Scheduled Meds: . atorvastatin  20 mg Oral q1800  . budesonide-formoterol  2 puff Inhalation BID  . diltiazem  120 mg Oral Daily  . fluticasone  2 spray Each Nare Daily  . lisinopril  10 mg Oral Daily  . metoprolol succinate  50 mg Oral Daily  . sodium chloride  3 mL Intravenous Q12H  . sodium chloride  3 mL Intravenous Q12H  . sodium chloride  3 mL Intravenous Q12H    Continuous Infusions: . lactated ringers Stopped (03/11/14 1900)    Past Medical History   Diagnosis Date  . Hypertension   . Allergy   . Asthma   . Anxiety     Past Surgical History  Procedure Laterality Date  . Tubal ligation    . Cesarean section    . Video bronchoscopy with endobronchial ultrasound N/A 03/11/2014    Procedure: VIDEO BRONCHOSCOPY WITH ENDOBRONCHIAL ULTRASOUND;  Surgeon: Melrose Nakayama, MD;  Location: [facility];  Service: Thoracic;  Laterality: N/A;  . Mediastinoscopy N/A 03/11/2014    Procedure: MEDIASTINOSCOPY;  Surgeon: Melrose Nakayama, MD;  Location: [facility];  Service: Thoracic;  Laterality: N/A;    Mikey College MS, [place], [place] Pager [phone] After Hours Pager

## 2014-03-13 NOTE — Progress Notes (Signed)
  Radiation Oncology         [phone] ________________________________  Name: Brittany [name] MRN: [id number]  Date: 03/13/2014  DOB: 1961/05/12  Simulation Verification Note  Status: inpatient  NARRATIVE: The patient was brought to the treatment unit and placed in the planned treatment position. The clinical setup was verified. Then port films were obtained and uploaded to the radiation oncology medical record software.  The treatment beams were carefully compared against the planned radiation fields. The position location and shape of the radiation fields was reviewed. The targeted volume of tissue appears appropriately covered by the radiation beams. Organs at risk appear to be excluded as planned.  Based on my personal review, I approved the simulation verification. The patient's treatment will proceed as planned.  ------------------------------------------------  [name], MD

## 2014-03-13 NOTE — Progress Notes (Signed)
Received call from tele that patient had converted to a-fib and HR was in the 110s. While on the phone with CMT, patient converted back to NSR with HR 50s-60s. Patient estimated to be in a-fib about one minute. MD on call notified, no new orders at this time. Will continue to monitor patient.

## 2014-03-13 NOTE — Progress Notes (Signed)
[facility] PROGRESS NOTE  Brittany [name] ALP:[id number] DOB: [birth year] DOA: 03/11/2014 PCP: Pcp Not In System  Assessment/Plan:   Lung mass - Defer recommendations to Heme/Onc and Radiation oncology - Once able to be discharged from oncologist and radiation oncologist perspective will start discharge process if ok with cardiologist. - MRI of head negative for metastasis - CT of abdomen/pelvis reports no evidence of metastatic disease  Principal Problem:   Atrial fibrillation - Cardiology on board and managing.  Active Problems:   HTN (hypertension) - Pt currently on cardizem, lisinopril, and metoprolol - will continue to monitor.  Code Status: full Family Communication: No family at bedside. Disposition Plan: Pending further evaluation from specialist involved.   Consultants:  Heme/Onc  Radiation oncology  Cardiology  Cardiothoracic surgeon  Procedures:  MRI of brain  CT of abdomen and pelvis  Antibiotics:  None  HPI/Subjective: Patient has no new complaints. Denies any SOB or hemoptysis.  Objective: Filed Vitals:   03/13/14 0630  BP: 157/90  Pulse: 73  Temp:   Resp:     Intake/Output Summary (Last 24 hours) at 03/13/14 1347 Last data filed at 03/13/14 1053  Gross per 24 hour  Intake 895.29 ml  Output      0 ml  Net 895.29 ml   Filed Weights   03/11/14 1814 03/12/14 1313 03/13/14 0457  Weight: 54.5 kg (120 lb 2.4 oz) 53.4 kg (117 lb 11.6 oz) 53.5 kg (117 lb 15.1 oz)    Exam:   General:  Pt in NAD, Alert and awake  Cardiovascular: irregularly irregular, no murmurs  Respiratory: CTA BL, no wheezes  Abdomen: soft, NT, ND  Musculoskeletal: no cyanosis or clubbing   Data Reviewed: Basic Metabolic Panel:  Recent Labs Lab 03/11/14 1002 03/12/14 0530  NA 137 138  K 4.9 4.3  CL 98 99  CO2 17* 23  GLUCOSE 82 123*  BUN 13 12  CREATININE 0.52 0.67  CALCIUM 9.7 9.1   Liver Function Tests:  Recent Labs Lab  03/11/14 1002  AST 31  ALT 26  ALKPHOS 110  BILITOT 0.6  PROT 7.6  ALBUMIN 3.9   No results found for this basename: LIPASE, AMYLASE,  in the last 168 hours No results found for this basename: AMMONIA,  in the last 168 hours CBC:  Recent Labs Lab 03/11/14 1002  WBC 9.9  HGB 20.3*  HCT 56.9*  MCV 97.8  PLT 220   Cardiac Enzymes: No results found for this basename: CKTOTAL, CKMB, CKMBINDEX, TROPONINI,  in the last 168 hours BNP (last 3 results) No results found for this basename: PROBNP,  in the last 8760 hours CBG: No results found for this basename: GLUCAP,  in the last 168 hours  Recent Results (from the past 240 hour(s))  MRSA PCR SCREENING     Status: None   Collection Time    03/11/14  8:40 PM      Result Value Ref Range Status   MRSA by PCR NEGATIVE  NEGATIVE Final   Comment:            The GeneXpert MRSA Assay (FDA     approved for NASAL specimens     only), is one component of a     comprehensive MRSA colonization     surveillance program. It is not     intended to diagnose MRSA     infection nor to guide or     monitor treatment for     MRSA infections.  Studies: Ct Abdomen Pelvis Wo Contrast  03/13/2014   CLINICAL DATA:  Newly diagnosed lung cancer, evaluate for metastases  EXAM: CT ABDOMEN AND PELVIS WITHOUT CONTRAST  TECHNIQUE: Multidetector CT imaging of the abdomen and pelvis was performed following the standard protocol without intravenous contrast.  COMPARISON:  None.  FINDINGS: Lung bases are essentially clear.  Unenhanced liver, spleen, pancreas, and adrenal glands are within normal limits.  Gallbladder is underdistended. No intrahepatic or extrahepatic ductal dilatation.  Kidneys are unremarkable.  No renal calculi or hydronephrosis.  No evidence of bowel obstruction.  Normal appendix.  Atherosclerotic calcifications of the abdominal aorta and branch vessels.  No abdominopelvic ascites.  No suspicious abdominopelvic lymphadenopathy.  Status post  hysterectomy.  No adnexal masses.  Bladder is within normal limits.  Visualized osseous structures are within normal limits.  IMPRESSION: No evidence of metastatic disease in the abdomen/pelvis.   Electronically Signed   By: Julian Hy M.D.   On: 03/13/2014 11:47   Mr Jeri Cos TD Contrast  03/13/2014   CLINICAL DATA:  New diagnosis lung cancer.  Staging.  EXAM: MRI HEAD WITHOUT AND WITH CONTRAST  TECHNIQUE: Multiplanar, multiecho pulse sequences of the brain and surrounding structures were obtained without and with intravenous contrast.  CONTRAST:  69mL MULTIHANCE GADOBENATE DIMEGLUMINE 529 MG/ML IV SOLN  COMPARISON:  Head CT 03/23/2004  FINDINGS: The brain has a normal appearance on all pulse sequences without evidence of malformation, atrophy, old or acute infarction, mass lesion, hemorrhage, hydrocephalus or extra-axial collection. No pituitary mass. No fluid in the sinuses, middle ears or mastoids. No skull or skullbase lesion. There is flow in the major vessels at the base of the brain. Major venous sinuses show flow. After contrast administration, no abnormal enhancement occurs.  IMPRESSION: Normal examination.  No metastatic disease.   Electronically Signed   By: Nelson Chimes M.D.   On: 03/13/2014 12:24   Dg Chest Port 1 View  03/11/2014   CLINICAL DATA:  Status post bronchoscopy  EXAM: PORTABLE CHEST - 1 VIEW  COMPARISON:  Film from earlier in the same day.  FINDINGS: Prominent right perihilar density is noted consistent with the known history of mediastinal mass. No pneumothorax or pleural effusion is seen. No focal infiltrate is noted.  IMPRESSION: No evidence of pneumothorax following bronchoscopy.   Electronically Signed   By: Inez Catalina M.D.   On: 03/11/2014 16:43    Scheduled Meds: . atorvastatin  20 mg Oral q1800  . budesonide-formoterol  2 puff Inhalation BID  . diltiazem  120 mg Oral Daily  . fluticasone  2 spray Each Nare Daily  . lisinopril  10 mg Oral Daily  . metoprolol  succinate  50 mg Oral Daily  . sodium chloride  3 mL Intravenous Q12H  . sodium chloride  3 mL Intravenous Q12H  . sodium chloride  3 mL Intravenous Q12H   Continuous Infusions: . lactated ringers Stopped (03/11/14 1900)     Time spent: > 35 minutes    Velvet Bathe  [facility] Pager [phone]. If 7PM-7AM, please contact night-coverage at [email or web address], password [facility] 03/13/2014, 1:47 PM  LOS: 2 days

## 2014-03-13 NOTE — Clinical Documentation Improvement (Signed)
Possible Clinical Conditions? Severe Malnutrition   Protein Calorie Malnutrition Severe Protein Calorie Malnutrition Other Condition Cannot clinically determine  Supporting Information:(As per Nutrition Assessment by Christie Beckers, RD at 03/13/2014  1:54 PM Risk Factors:"large right hilar and mediastinal mass with near complete occlusion of the right mainstem along with narrowing of the right pulmonary artery"  Signs & Symptoms:" 9.3% weight loss in the past 3 months"  Diagnostics:Pt meets criteria for severe MALNUTRITION in the context of chronic illness as evidenced by <75% estimated energy intake in the past month with 9.3% weight loss in the past 3 months.  Treatment:INTERVENTION: - Resource Breeze BID - Encouraged increased meal intake - Will continue to monitor   Thank You, Alessandra Grout, RN, BSN, CCDS, Clinical Documentation Specialist:  [phone]   [phone] [place] [facility]

## 2014-03-14 DIAGNOSIS — E43 Unspecified severe protein-calorie malnutrition: Secondary | ICD-10-CM | POA: Insufficient documentation

## 2014-03-14 MED ORDER — BOOST / RESOURCE BREEZE PO LIQD: 1.0000 | Freq: Two times a day (BID) | ORAL | Status: DC

## 2014-03-14 MED ORDER — DILTIAZEM HCL ER COATED BEADS 120 MG PO CP24: 120.0000 mg | ORAL_CAPSULE | Freq: Every day | ORAL | Status: DC

## 2014-03-14 NOTE — Progress Notes (Signed)
Patient discharged home, d/c instructions and appointments reviewed with patient. Pt verbalized understanding

## 2014-03-14 NOTE — Discharge Summary (Signed)
Physician Discharge Summary  Brittany [name] GBT:[id number] DOB: 1961/02/17 DOA: 03/11/2014  PCP: Pcp Not In System  Admit date: 03/11/2014 Discharge date: 03/14/2014  Time spent: > 35 minutes  Recommendations for Outpatient Follow-up:  1. Needs continued f/u with Cardiology, oncologist, and radiation oncology. 2. Pt will require PET scan as outpatient.  Discharge Diagnoses:  Principal Problem:   Atrial fibrillation Active Problems:   HTN (hypertension)   Lung mass   SVT (supraventricular tachycardia)   Protein-calorie malnutrition, severe   Discharge Condition: stable  Diet recommendation: Heart healthy  Filed Weights   03/12/14 1313 03/13/14 0457 03/14/14 0549  Weight: 53.4 kg (117 lb 11.6 oz) 53.5 kg (117 lb 15.1 oz) 50.1 kg (110 lb 7.2 oz)    History of present illness:  53 y/o who presented to the hospital with newly found right hilar and mediastinal mass.    Hospital Course:  Lung mass  - Defer recommendations to Heme/Onc and Radiation oncology. Plan will be for patient to get short course of palliative radiotherapy. Oncology is currently waiting for biopsy results and if pathology is consistent with small cell carcinoma patient would start chemotherapy (considering cisplatin and etoposide). - Patient will require PET scan as outpatient  - MRI of head negative for metastasis  - CT of abdomen/pelvis reports no evidence of metastatic disease    Principal Problem:  Atrial fibrillation  - Cardiology on board and managed while patient in house. Place on cardizem with rate control. - will be discharged on metoprolol and diltiazem. - Currently anticoagulation on hold in lieu of new mass with possible plans for surgical intervention in the near future. - Pt to f/u with cardiology after discharge.    Active Problems:  HTN (hypertension)  - Pt currently on cardizem, lisinopril, and metoprolol  - will continue on discharge.   Procedures:  As listed  below  Consultations: Heme/Onc : Dr. Julien Nordmann Radiation oncology : Dr. Pablo Ledger Cardiology : Dr. Harl Bowie Cardiothoracic surgeon  Discharge Exam: Danley Danker Vitals:   03/14/14 0549  BP: 142/93  Pulse: 61  Temp: 98.3 F (36.8 C)  Resp: 18    General: Pt in NAD, alert and awake Cardiovascular: normal s1 and s2, no murmurs Respiratory: CTA BL, no wheezes  Discharge Instructions You were cared for by a hospitalist during your hospital stay. If you have any questions about your discharge medications or the care you received while you were in the hospital after you are discharged, you can call the unit and asked to speak with the hospitalist on call if the hospitalist that took care of you is not available. Once you are discharged, your primary care physician will handle any further medical issues. Please note that NO REFILLS for any discharge medications will be authorized once you are discharged, as it is imperative that you return to your primary care physician (or establish a relationship with a primary care physician if you do not have one) for your aftercare needs so that they can reassess your need for medications and monitor your lab values.  Discharge Orders   Future Appointments Provider Department Dept Phone   03/16/2014 12:15 PM [facility] [phone]   03/17/2014 12:30 PM [facility] [phone]   03/18/2014 12:30 PM [facility] [phone]   03/19/2014 9:00 AM Pixie Casino, MD [facility] [phone]   03/19/2014 12:30 PM [facility]  [phone]   03/20/2014 12:30 PM [facility] [phone]   03/23/2014 12:30 PM [facility] [phone]    03/24/2014 12:30 PM [facility] [phone]   03/25/2014 12:30 PM [facility] [phone]   03/26/2014 12:30 PM [facility] [phone]   Future Orders Complete By Expires   Call MD for:  redness, tenderness, or signs of infection (pain, swelling, redness, odor or green/yellow discharge around incision site)  As directed    Call MD for:  temperature >100.4  As directed    Diet - low sodium heart healthy  As directed    Discharge instructions  As directed    Comments:     Follow up with your oncologist 1 week from discharge.  Also be sure to call your radiation oncologist for further recommendations regarding further follow up for your radiation treatments.   Increase activity slowly  As directed        Medication List    STOP taking these medications       levofloxacin 500 MG tablet  Commonly known as:  LEVAQUIN      TAKE these medications       albuterol (2.5 MG/3ML) 0.083% nebulizer solution  Commonly known as:  PROVENTIL  Take 3 mLs (2.5 mg total) by nebulization every 6 (six) hours as needed for wheezing.     albuterol 108 (90 BASE) MCG/ACT inhaler  Commonly known as:  PROVENTIL HFA  Inhale 2 puffs into the lungs every 4 (four) hours as needed for wheezing or shortness of breath.     atorvastatin 20 MG tablet  Commonly known as:  LIPITOR  Take 1 tablet (20 mg total) by mouth daily.     budesonide-formoterol 160-4.5 MCG/ACT inhaler  Commonly known as:  SYMBICORT  Inhale 2 puffs into the lungs 2 (two) times daily.     chlorpheniramine-HYDROcodone 10-8 MG/5ML Lqcr  Commonly known as:  TUSSIONEX PENNKINETIC ER  Take 5 mLs by mouth every 12 (twelve) hours as needed.     diltiazem 120 MG 24 hr capsule  Commonly known as:  CARDIZEM CD  Take 1 capsule (120 mg total) by mouth daily.     feeding supplement  (RESOURCE BREEZE) Liqd  Take 1 Container by mouth 2 (two) times daily between meals.     fluticasone 50 MCG/ACT nasal spray  Commonly known as:  FLONASE  Place 2 sprays into both nostrils daily.     lisinopril 10 MG tablet  Commonly known as:  PRINIVIL,ZESTRIL  Take 10 mg by mouth daily.     metoprolol succinate 50 MG 24 hr tablet  Commonly known as:  TOPROL-XL  Take 1 tablet (50 mg total) by mouth daily. Take with or immediately following a meal.       Allergies  Allergen Reactions  . Codeine Nausea And Vomiting  . Iodinated Diagnostic Agents Hives and Rash  . Sulfa Antibiotics Hives and Rash       Follow-up Information   Follow up with Melrose Nakayama, MD.   Specialty:  Cardiothoracic Surgery   Contact information:   [address] [place] [address] [phone]        The results of significant diagnostics from this hospitalization (including imaging, microbiology, ancillary and laboratory) are listed below for reference.    Significant Diagnostic Studies: Ct Abdomen Pelvis  Wo Contrast  03/13/2014   CLINICAL DATA:  Newly diagnosed lung cancer, evaluate for metastases  EXAM: CT ABDOMEN AND PELVIS WITHOUT CONTRAST  TECHNIQUE: Multidetector CT imaging of the abdomen and pelvis was performed following the standard protocol without intravenous contrast.  COMPARISON:  None.  FINDINGS: Lung bases are essentially clear.  Unenhanced liver, spleen, pancreas, and adrenal glands are within normal limits.  Gallbladder is underdistended. No intrahepatic or extrahepatic ductal dilatation.  Kidneys are unremarkable.  No renal calculi or hydronephrosis.  No evidence of bowel obstruction.  Normal appendix.  Atherosclerotic calcifications of the abdominal aorta and branch vessels.  No abdominopelvic ascites.  No suspicious abdominopelvic lymphadenopathy.  Status post hysterectomy.  No adnexal masses.  Bladder is within normal limits.  Visualized osseous structures are  within normal limits.  IMPRESSION: No evidence of metastatic disease in the abdomen/pelvis.   Electronically Signed   By: Julian Hy M.D.   On: 03/13/2014 11:47   Chest 2 View  03/11/2014   CLINICAL DATA:  Known hilar and mediastinal mass  EXAM: CHEST  2 VIEW  COMPARISON:  03/05/2014  FINDINGS: Cardiac shadow is stable. The lungs are well aerated bilaterally. The left lung is clear. The known right hilar/ mediastinal mass is again identified. No definitive infiltrate is identified.  IMPRESSION: Stable right hilar/ mediastinal mass.   Electronically Signed   By: Inez Catalina M.D.   On: 03/11/2014 11:52   Dg Chest 2 View  03/05/2014   CLINICAL DATA:  Shortness of breath, chest pain  EXAM: CHEST  2 VIEW  COMPARISON:  March 04, 2014  FINDINGS: The previously noted right perihilar mass with associated postobstructive bronchopneumonia is unchanged. There is no pleural effusion. There is no pulmonary edema. The mediastinal contour and cardiac silhouette are stable. The soft tissues and osseous structures are normal.  IMPRESSION: Previously noted right perihilar mass with associated postobstructive bronchopneumonia is unchanged.   Electronically Signed   By: Abelardo Diesel M.D.   On: 03/05/2014 12:18   Dg Chest 2 View  03/04/2014   CLINICAL DATA:  Shortness of breath, history of smoking  EXAM: CHEST  2 VIEW  COMPARISON:  DG CHEST 2V dated 02/24/2014; DG CHEST 2 VIEW dated 11/22/2011; DG CHEST 2 VIEW dated 05/25/2008  FINDINGS: Grossly unchanged cardiac silhouette and mediastinal contours with similar appearance of previously identified large right-sided perihilar mass with associated heterogeneous opacities radiating peripherally along the right major and minor fissures. No new focal airspace opacities. No pleural effusion or pneumothorax. No evidence of edema.  IMPRESSION: Grossly unchanged appearance of previously identified large right-sided perihilar mass with associated postobstructive infection. These  findings are again worrisome for bronchogenic carcinoma and further evaluation with chest CT is again recommended.  These results will be called to the ordering clinician or representative by the Radiologist Assistant, and communication documented in the PACS Dashboard.   Electronically Signed   By: Sandi Mariscal M.D.   On: 03/04/2014 15:15   Dg Chest 2 View  02/24/2014   CLINICAL DATA:  Shortness of breath.  EXAM: CHEST  2 VIEW  COMPARISON:  November 22, 2011.  FINDINGS: Cardiac size is within normal limits. There is right hilar prominence which is increased compared to prior exam with associated opacity consistent with atelectasis or possibly pneumonia. Left lung is clear. No pneumothorax or pleural effusion is noted. Bony thorax is intact.  IMPRESSION: Right hilar prominence is noted concerning for neoplasm or malignancy with postobstructive atelectasis. CT scan of the chest with  contrast is recommended for further evaluation. These results will be called to the ordering clinician or representative by the Radiologist Assistant, and communication documented in the PACS Dashboard.   Electronically Signed   By: Sabino Dick M.D.   On: 02/24/2014 16:39   Ct Angio Chest Pe W/cm &/or Wo Cm  03/05/2014   CLINICAL DATA:  Shortness of breath.  EXAM: CT ANGIOGRAPHY CHEST WITH CONTRAST  TECHNIQUE: Multidetector CT imaging of the chest was performed using the standard protocol during bolus administration of intravenous contrast. Multiplanar CT image reconstructions and MIPs were obtained to evaluate the vascular anatomy.  CONTRAST:  20mL OMNIPAQUE IOHEXOL 350 MG/ML SOLN  COMPARISON:  DG CHEST 2 VIEW dated 03/05/2014; DG CHEST 2V dated 03/04/2014; DG CHEST 2V dated 02/24/2014  FINDINGS: Thoracic aorta unremarkable. No evidence of pulmonary embolus. There is narrowing of the right pulmonary artery secondary to large mediastinal and hilar mass. Cardiomegaly. Small pericardial effusion.  Extensive mediastinal and hilar  adenopathy and/or mass lesion is present. This results in narrowing of the right pulmonary artery and near complete occlusion of the right bronchus. The mass extends into the subcarinal region and into the left hilum. Associated left hilar adenopathy is most likely present. The mass conglomerate measures up to 8.1 x 6.5 x 7.3 cm. Visualized portions of the thoracic esophagus unremarkable.  Trachea and left mainstem bronchus are patent. Again there is near-complete occlusion of the right mainstem bronchus. Infiltrative changes are present in the right middle and right lower lobes. These changes are most likely related to postobstructive pneumonitis. No pneumothorax. No significant pleural effusion.  Adrenals normal.  Visualized upper abdominal viscera unremarkable.  Punctate lucency in the right thyroid lobe, most likely tiny cysts. Shotty bilateral axillary lymph nodes. Breast tissue is dense with asymmetric densities for which further evaluation with mammography suggested. No focal bony abnormalities identified.  Review of the MIP images confirms the above findings.  IMPRESSION: 1. Large mediastinal and right hilar mass lesions with narrowing of the right pulmonary artery and near complete occlusion of the right mainstem bronchus. These changes are most likely malignant and related to a process such as primary lung cancer with metastatic lymphadenopathy, metastatic adenopathy from other primary, or lymphoma. PET-CT can be obtained for further evaluation. 2. No evidence of pulmonary embolus. Again the right pulmonary artery is narrowed secondary to the above described mass lesion. 3. Postobstructive right middle lobe and right lower lobe pneumonia. Again the right mainstem bronchus is almost completely occluded by the above-described mass. 4. Breasts are bilaterally dense with asymmetric densities, further evaluation with mammography suggested. 5. Cardiomegaly with small pericardial effusion.   Electronically Signed    By: Marcello Moores  Register   On: 03/05/2014 15:53   Mr Jeri Cos Wo Contrast  03/13/2014   CLINICAL DATA:  New diagnosis lung cancer.  Staging.  EXAM: MRI HEAD WITHOUT AND WITH CONTRAST  TECHNIQUE: Multiplanar, multiecho pulse sequences of the brain and surrounding structures were obtained without and with intravenous contrast.  CONTRAST:  44mL MULTIHANCE GADOBENATE DIMEGLUMINE 529 MG/ML IV SOLN  COMPARISON:  Head CT 03/23/2004  FINDINGS: The brain has a normal appearance on all pulse sequences without evidence of malformation, atrophy, old or acute infarction, mass lesion, hemorrhage, hydrocephalus or extra-axial collection. No pituitary mass. No fluid in the sinuses, middle ears or mastoids. No skull or skullbase lesion. There is flow in the major vessels at the base of the brain. Major venous sinuses show flow. After contrast administration, no abnormal enhancement occurs.  IMPRESSION: Normal examination.  No metastatic disease.   Electronically Signed   By: Nelson Chimes M.D.   On: 03/13/2014 12:24   Dg Chest Port 1 View  03/11/2014   CLINICAL DATA:  Status post bronchoscopy  EXAM: PORTABLE CHEST - 1 VIEW  COMPARISON:  Film from earlier in the same day.  FINDINGS: Prominent right perihilar density is noted consistent with the known history of mediastinal mass. No pneumothorax or pleural effusion is seen. No focal infiltrate is noted.  IMPRESSION: No evidence of pneumothorax following bronchoscopy.   Electronically Signed   By: Inez Catalina M.D.   On: 03/11/2014 16:43    Microbiology: Recent Results (from the past 240 hour(s))  MRSA PCR SCREENING     Status: None   Collection Time    03/11/14  8:40 PM      Result Value Ref Range Status   MRSA by PCR NEGATIVE  NEGATIVE Final   Comment:            The GeneXpert MRSA Assay (FDA     approved for NASAL specimens     only), is one component of a     comprehensive MRSA colonization     surveillance program. It is not     intended to diagnose MRSA      infection nor to guide or     monitor treatment for     MRSA infections.     Labs: Basic Metabolic Panel:  Recent Labs Lab 03/11/14 1002 03/12/14 0530  NA 137 138  K 4.9 4.3  CL 98 99  CO2 17* 23  GLUCOSE 82 123*  BUN 13 12  CREATININE 0.52 0.67  CALCIUM 9.7 9.1   Liver Function Tests:  Recent Labs Lab 03/11/14 1002  AST 31  ALT 26  ALKPHOS 110  BILITOT 0.6  PROT 7.6  ALBUMIN 3.9   No results found for this basename: LIPASE, AMYLASE,  in the last 168 hours No results found for this basename: AMMONIA,  in the last 168 hours CBC:  Recent Labs Lab 03/11/14 1002  WBC 9.9  HGB 20.3*  HCT 56.9*  MCV 97.8  PLT 220   Cardiac Enzymes: No results found for this basename: CKTOTAL, CKMB, CKMBINDEX, TROPONINI,  in the last 168 hours BNP: BNP (last 3 results) No results found for this basename: PROBNP,  in the last 8760 hours CBG: No results found for this basename: GLUCAP,  in the last 168 hours     Signed:  Velvet Bathe  [facility] 03/14/2014, 10:33 AM

## 2014-03-14 NOTE — Progress Notes (Signed)
Patient ID: Anaalicia Reimann, female   DOB: 07-24-1961, 53 y.o.   MRN: [id number]     Subjective:    No complaints this AM  Objective:   Temp:  [98.2 F (36.8 C)-98.3 F (36.8 C)] 98.3 F (36.8 C) (04/04 0549) Pulse Rate:  [61-80] 61 (04/04 0549) Resp:  [18-19] 18 (04/04 0549) BP: (142-162)/(87-93) 142/93 mmHg (04/04 0549) SpO2:  [94 %-97 %] 95 % (04/04 0549) Weight:  [110 lb 7.2 oz (50.1 kg)] 110 lb 7.2 oz (50.1 kg) (04/04 0549) Last BM Date: 03/13/14  Filed Weights   03/12/14 1313 03/13/14 0457 03/14/14 0549  Weight: 117 lb 11.6 oz (53.4 kg) 117 lb 15.1 oz (53.5 kg) 110 lb 7.2 oz (50.1 kg)    Intake/Output Summary (Last 24 hours) at 03/14/14 0814 Last data filed at 03/13/14 1457  Gross per 24 hour  Intake    840 ml  Output      0 ml  Net    840 ml    Telemetry: NSR, atach  Exam:  General:NAD  Resp: CTAB  Cardiac: RRR, no m/r/g, no JVD  HY:QMVHQIO soft, NT, ND  MSK: LE warm, no edema  Neuro: no focal deficits   Lab Results:  Basic Metabolic Panel:  Recent Labs Lab 03/11/14 1002 03/12/14 0530  NA 137 138  K 4.9 4.3  CL 98 99  CO2 17* 23  GLUCOSE 82 123*  BUN 13 12  CREATININE 0.52 0.67  CALCIUM 9.7 9.1    Liver Function Tests:  Recent Labs Lab 03/11/14 1002  AST 31  ALT 26  ALKPHOS 110  BILITOT 0.6  PROT 7.6  ALBUMIN 3.9    CBC:  Recent Labs Lab 03/11/14 1002  WBC 9.9  HGB 20.3*  HCT 56.9*  MCV 97.8  PLT 220    Cardiac Enzymes: No results found for this basename: CKTOTAL, CKMB, CKMBINDEX, TROPONINI,  in the last 168 hours  BNP: No results found for this basename: PROBNP,  in the last 8760 hours  Coagulation:  Recent Labs Lab 03/11/14 1125  INR 1.06    ECG:   Medications:   Scheduled Medications: . atorvastatin  20 mg Oral q1800  . budesonide-formoterol  2 puff Inhalation BID  . diltiazem  120 mg Oral Daily  . feeding supplement (RESOURCE BREEZE)  1 Container Oral BID BM  . fluticasone  2 spray  Each Nare Daily  . lisinopril  10 mg Oral Daily  . metoprolol succinate  50 mg Oral Daily  . sodium chloride  3 mL Intravenous Q12H  . sodium chloride  3 mL Intravenous Q12H  . sodium chloride  3 mL Intravenous Q12H     Infusions: . lactated ringers Stopped (03/11/14 1900)     PRN Medications:  sodium chloride, sodium chloride, acetaminophen, acetaminophen, chlorpheniramine-HYDROcodone, levalbuterol, ondansetron (ZOFRAN) IV, ondansetron, senna, sodium chloride, sodium chloride, traMADol     Assessment/Plan   53 yo woman hx of HTN, asthma, tobacco,lung mass with episode of atach vs. afib after undergoing lung bx.   1. SVT - per notes noted afib initially after bronch, telemetry also shows episodes of atach.  - initially managed with cardizem gtt, now on oral dilt. Tele shows sinus rhythm rates in low 90s, occasional short bursts of atach, overall rates appear fairly well controlled on current dilt dose.  - given noted afib, her CHADS2Vasc score is 2, meaning she should be considered for anticoagulation. Hesitant to start in the setting ongoing lung mass management, hold at this  time.  - continue current dilt dose, can titrate further as needed.     Carlyle Dolly, M.D., F.A.C.C.

## 2014-03-16 ENCOUNTER — Other Ambulatory Visit: Payer: Self-pay | Admitting: *Deleted

## 2014-03-16 ENCOUNTER — Ambulatory Visit (HOSPITAL_BASED_OUTPATIENT_CLINIC_OR_DEPARTMENT_OTHER): Payer: BC Managed Care – PPO | Admitting: Internal Medicine

## 2014-03-16 ENCOUNTER — Encounter: Payer: Self-pay | Admitting: Internal Medicine

## 2014-03-16 ENCOUNTER — Ambulatory Visit (HOSPITAL_BASED_OUTPATIENT_CLINIC_OR_DEPARTMENT_OTHER): Payer: BC Managed Care – PPO

## 2014-03-16 ENCOUNTER — Telehealth: Payer: Self-pay | Admitting: Internal Medicine

## 2014-03-16 ENCOUNTER — Encounter: Payer: Self-pay | Admitting: *Deleted

## 2014-03-16 ENCOUNTER — Ambulatory Visit: Payer: BC Managed Care – PPO

## 2014-03-16 VITALS — BP 154/101 | HR 114 | Temp 98.4°F | Resp 18 | Ht 59.0 in | Wt 111.0 lb

## 2014-03-16 DIAGNOSIS — C3490 Malignant neoplasm of unspecified part of unspecified bronchus or lung: Secondary | ICD-10-CM | POA: Insufficient documentation

## 2014-03-16 DIAGNOSIS — C7A1 Malignant poorly differentiated neuroendocrine tumors: Secondary | ICD-10-CM

## 2014-03-16 DIAGNOSIS — R918 Other nonspecific abnormal finding of lung field: Secondary | ICD-10-CM

## 2014-03-16 HISTORY — DX: Malignant neoplasm of unspecified part of unspecified bronchus or lung: C34.90

## 2014-03-16 LAB — COMPREHENSIVE METABOLIC PANEL (CC13)
ALT: 28 U/L (ref 0–55)
ANION GAP: 12 meq/L — AB (ref 3–11)
AST: 24 U/L (ref 5–34)
Albumin: 4.1 g/dL (ref 3.5–5.0)
Alkaline Phosphatase: 103 U/L (ref 40–150)
BILIRUBIN TOTAL: 0.94 mg/dL (ref 0.20–1.20)
BUN: 12 mg/dL (ref 7.0–26.0)
CO2: 24 mEq/L (ref 22–29)
Calcium: 10.1 mg/dL (ref 8.4–10.4)
Chloride: 103 mEq/L (ref 98–109)
Creatinine: 0.8 mg/dL (ref 0.6–1.1)
GLUCOSE: 168 mg/dL — AB (ref 70–140)
Potassium: 3.8 mEq/L (ref 3.5–5.1)
SODIUM: 139 meq/L (ref 136–145)
Total Protein: 7.5 g/dL (ref 6.4–8.3)

## 2014-03-16 LAB — CBC WITH DIFFERENTIAL/PLATELET
BASO%: 0.7 % (ref 0.0–2.0)
Basophils Absolute: 0.1 10*3/uL (ref 0.0–0.1)
EOS%: 2.1 % (ref 0.0–7.0)
Eosinophils Absolute: 0.2 10*3/uL (ref 0.0–0.5)
HEMATOCRIT: 58.8 % — AB (ref 34.8–46.6)
HGB: 18.9 g/dL — ABNORMAL HIGH (ref 11.6–15.9)
LYMPH%: 19 % (ref 14.0–49.7)
MCH: 32.6 pg (ref 25.1–34.0)
MCHC: 32.2 g/dL (ref 31.5–36.0)
MCV: 101.2 fL — ABNORMAL HIGH (ref 79.5–101.0)
MONO#: 0.6 10*3/uL (ref 0.1–0.9)
MONO%: 7.3 % (ref 0.0–14.0)
NEUT#: 6.2 10*3/uL (ref 1.5–6.5)
NEUT%: 70.9 % (ref 38.4–76.8)
PLATELETS: 202 10*3/uL (ref 145–400)
RBC: 5.81 10*6/uL — ABNORMAL HIGH (ref 3.70–5.45)
RDW: 25.2 % — ABNORMAL HIGH (ref 11.2–14.5)
WBC: 8.7 10*3/uL (ref 3.9–10.3)
lymph#: 1.7 10*3/uL (ref 0.9–3.3)

## 2014-03-16 MED ORDER — PROCHLORPERAZINE MALEATE 10 MG PO TABS: 10.0000 mg | ORAL_TABLET | Freq: Four times a day (QID) | ORAL | Status: DC | PRN

## 2014-03-16 NOTE — Progress Notes (Signed)
[place] Telephone:([phone]) [phone]   Fax:([phone]) [phone]  OFFICE PROGRESS NOTE  Pcp Not In System No address on file  DIAGNOSIS: Limited stage small cell lung cancer diagnosed in March of 2015.  PRIOR THERAPY: Single fraction of radiotherapy to the mediastinum under the care of Dr. Pablo Ledger on 03/13/2014.  CURRENT THERAPY: Systemic chemotherapy with cisplatin 60 mg/M2 day 1 and etoposide 120 mg/M2 on days 1, 2 and 3 with Neulasta support on day 4. First dose on 03/18/2014.  INTERVAL HISTORY: Brittany [name] 53 y.o. female returns to the clinic today for followup visit accompanied by her friend. The patient is feeling fine today with no specific complaints. She denied having any significant chest pain, shortness of breath, cough or hemoptysis. The patient denied having any significant fever or chills no nausea or vomiting. She was seen for initial evaluation during her hospitalization at [facility] with recent diagnosis of small cell lung cancer. The patient is here today for evaluation and discussion of her treatment options. She had MRI of the brain that showed no evidence for metastatic disease to the brain and CT scan of the abdomen and pelvis showed no evidence for metastatic disease in the abdomen or pelvis.  MEDICAL HISTORY: Past Medical History  Diagnosis Date  . Hypertension   . Allergy   . Asthma   . Anxiety     ALLERGIES:  is allergic to codeine; iodinated diagnostic agents; and sulfa antibiotics.  MEDICATIONS:  Current Outpatient Prescriptions  Medication Sig Dispense Refill  . albuterol (PROVENTIL HFA) 108 (90 BASE) MCG/ACT inhaler Inhale 2 puffs into the lungs every 4 (four) hours as needed for wheezing or shortness of breath.  6.7 each  5  . albuterol (PROVENTIL) (2.5 MG/3ML) 0.083% nebulizer solution Take 3 mLs (2.5 mg total) by nebulization every 6 (six) hours as needed for wheezing.  150 mL  1  . atorvastatin (LIPITOR) 20 MG  tablet Take 1 tablet (20 mg total) by mouth daily.  90 tablet  0  . budesonide-formoterol (SYMBICORT) 160-4.5 MCG/ACT inhaler Inhale 2 puffs into the lungs 2 (two) times daily.      . chlorpheniramine-HYDROcodone (TUSSIONEX PENNKINETIC ER) 10-8 MG/5ML LQCR Take 5 mLs by mouth every 12 (twelve) hours as needed.  60 mL  0  . diltiazem (CARDIZEM CD) 120 MG 24 hr capsule Take 1 capsule (120 mg total) by mouth daily.  30 capsule  0  . feeding supplement, RESOURCE BREEZE, (RESOURCE BREEZE) LIQD Take 1 Container by mouth 2 (two) times daily between meals.  30 Container  0  . fluticasone (FLONASE) 50 MCG/ACT nasal spray Place 2 sprays into both nostrils daily.      Marland Kitchen lisinopril (PRINIVIL,ZESTRIL) 10 MG tablet Take 10 mg by mouth daily.      . metoprolol succinate (TOPROL-XL) 50 MG 24 hr tablet Take 1 tablet (50 mg total) by mouth daily. Take with or immediately following a meal.  30 tablet  3   No current facility-administered medications for this visit.    SURGICAL HISTORY:  Past Surgical History  Procedure Laterality Date  . Tubal ligation    . Cesarean section    . Video bronchoscopy with endobronchial ultrasound N/A 03/11/2014    Procedure: VIDEO BRONCHOSCOPY WITH ENDOBRONCHIAL ULTRASOUND;  Surgeon: Melrose Nakayama, MD;  Location: [place];  Service: Thoracic;  Laterality: N/A;  . Mediastinoscopy N/A 03/11/2014    Procedure: MEDIASTINOSCOPY;  Surgeon: Melrose Nakayama, MD;  Location: [place];  Service: Thoracic;  Laterality: N/A;    REVIEW OF SYSTEMS:  Constitutional: positive for fatigue and weight loss Eyes: negative Ears, nose, mouth, throat, and face: negative Respiratory: negative Cardiovascular: negative Gastrointestinal: negative Genitourinary:negative Integument/breast: negative Hematologic/lymphatic: negative Musculoskeletal:negative Neurological: negative Behavioral/Psych: negative Endocrine: negative Allergic/Immunologic: negative   PHYSICAL EXAMINATION: General  appearance: alert, cooperative and no distress Head: Normocephalic, without obvious abnormality, atraumatic Neck: no adenopathy, no JVD, supple, symmetrical, trachea midline and thyroid not enlarged, symmetric, no tenderness/mass/nodules Lymph nodes: Cervical, supraclavicular, and axillary nodes normal. Resp: wheezes bilaterally Back: symmetric, no curvature. ROM normal. No CVA tenderness. Cardio: regular rate and rhythm, S1, S2 normal, no murmur, click, rub or gallop GI: soft, non-tender; bowel sounds normal; no masses,  no organomegaly Extremities: extremities normal, atraumatic, no cyanosis or edema Neurologic: Alert and oriented X 3, normal strength and tone. Normal symmetric reflexes. Normal coordination and gait  ECOG PERFORMANCE STATUS: 1 - Symptomatic but completely ambulatory  Blood pressure 154/101, pulse 114, temperature 98.4 F (36.9 C), temperature source Oral, resp. rate 18, height 4\' 11"  (1.499 m), weight 111 lb (50.349 kg).  LABORATORY DATA: Lab Results  Component Value Date   WBC 8.7 03/16/2014   HGB 18.9* 03/16/2014   HCT 58.8* 03/16/2014   MCV 101.2* 03/16/2014   PLT 202 03/16/2014      Chemistry      Component Value Date/Time   NA 139 03/16/2014 1341   NA 138 03/12/2014 0530   K 3.8 03/16/2014 1341   K 4.3 03/12/2014 0530   CL 99 03/12/2014 0530   CO2 24 03/16/2014 1341   CO2 23 03/12/2014 0530   BUN 12.0 03/16/2014 1341   BUN 12 03/12/2014 0530   CREATININE 0.8 03/16/2014 1341   CREATININE 0.67 03/12/2014 0530   CREATININE 0.56 01/01/2014 1111      Component Value Date/Time   CALCIUM 10.1 03/16/2014 1341   CALCIUM 9.1 03/12/2014 0530   ALKPHOS 103 03/16/2014 1341   ALKPHOS 110 03/11/2014 1002   AST 24 03/16/2014 1341   AST 31 03/11/2014 1002   ALT 28 03/16/2014 1341   ALT 26 03/11/2014 1002   BILITOT 0.94 03/16/2014 1341   BILITOT 0.6 03/11/2014 1002       RADIOGRAPHIC STUDIES: Ct Abdomen Pelvis Wo Contrast  03/13/2014   CLINICAL DATA:  Newly diagnosed lung cancer, evaluate for metastases   EXAM: CT ABDOMEN AND PELVIS WITHOUT CONTRAST  TECHNIQUE: Multidetector CT imaging of the abdomen and pelvis was performed following the standard protocol without intravenous contrast.  COMPARISON:  None.  FINDINGS: Lung bases are essentially clear.  Unenhanced liver, spleen, pancreas, and adrenal glands are within normal limits.  Gallbladder is underdistended. No intrahepatic or extrahepatic ductal dilatation.  Kidneys are unremarkable.  No renal calculi or hydronephrosis.  No evidence of bowel obstruction.  Normal appendix.  Atherosclerotic calcifications of the abdominal aorta and branch vessels.  No abdominopelvic ascites.  No suspicious abdominopelvic lymphadenopathy.  Status post hysterectomy.  No adnexal masses.  Bladder is within normal limits.  Visualized osseous structures are within normal limits.  IMPRESSION: No evidence of metastatic disease in the abdomen/pelvis.   Electronically Signed   By: Julian Hy M.D.   On: 03/13/2014 11:47   Ct Angio Chest Pe W/cm &/or Wo Cm  03/05/2014   CLINICAL DATA:  Shortness of breath.  EXAM: CT ANGIOGRAPHY CHEST WITH CONTRAST  TECHNIQUE: Multidetector CT imaging of the chest was performed using the standard protocol during bolus administration of intravenous contrast. Multiplanar CT  image reconstructions and MIPs were obtained to evaluate the vascular anatomy.  CONTRAST:  77mL OMNIPAQUE IOHEXOL 350 MG/ML SOLN  COMPARISON:  DG CHEST 2 VIEW dated 03/05/2014; DG CHEST 2V dated 03/04/2014; DG CHEST 2V dated 02/24/2014  FINDINGS: Thoracic aorta unremarkable. No evidence of pulmonary embolus. There is narrowing of the right pulmonary artery secondary to large mediastinal and hilar mass. Cardiomegaly. Small pericardial effusion.  Extensive mediastinal and hilar adenopathy and/or mass lesion is present. This results in narrowing of the right pulmonary artery and near complete occlusion of the right bronchus. The mass extends into the subcarinal region and into the left  hilum. Associated left hilar adenopathy is most likely present. The mass conglomerate measures up to 8.1 x 6.5 x 7.3 cm. Visualized portions of the thoracic esophagus unremarkable.  Trachea and left mainstem bronchus are patent. Again there is near-complete occlusion of the right mainstem bronchus. Infiltrative changes are present in the right middle and right lower lobes. These changes are most likely related to postobstructive pneumonitis. No pneumothorax. No significant pleural effusion.  Adrenals normal.  Visualized upper abdominal viscera unremarkable.  Punctate lucency in the right thyroid lobe, most likely tiny cysts. Shotty bilateral axillary lymph nodes. Breast tissue is dense with asymmetric densities for which further evaluation with mammography suggested. No focal bony abnormalities identified.  Review of the MIP images confirms the above findings.  IMPRESSION: 1. Large mediastinal and right hilar mass lesions with narrowing of the right pulmonary artery and near complete occlusion of the right mainstem bronchus. These changes are most likely malignant and related to a process such as primary lung cancer with metastatic lymphadenopathy, metastatic adenopathy from other primary, or lymphoma. PET-CT can be obtained for further evaluation. 2. No evidence of pulmonary embolus. Again the right pulmonary artery is narrowed secondary to the above described mass lesion. 3. Postobstructive right middle lobe and right lower lobe pneumonia. Again the right mainstem bronchus is almost completely occluded by the above-described mass. 4. Breasts are bilaterally dense with asymmetric densities, further evaluation with mammography suggested. 5. Cardiomegaly with small pericardial effusion.   Electronically Signed   By: Marcello Moores  Register   On: 03/05/2014 15:53   Mr Jeri Cos Wo Contrast  03/13/2014   CLINICAL DATA:  New diagnosis lung cancer.  Staging.  EXAM: MRI HEAD WITHOUT AND WITH CONTRAST  TECHNIQUE: Multiplanar,  multiecho pulse sequences of the brain and surrounding structures were obtained without and with intravenous contrast.  CONTRAST:  95mL MULTIHANCE GADOBENATE DIMEGLUMINE 529 MG/ML IV SOLN  COMPARISON:  Head CT 03/23/2004  FINDINGS: The brain has a normal appearance on all pulse sequences without evidence of malformation, atrophy, old or acute infarction, mass lesion, hemorrhage, hydrocephalus or extra-axial collection. No pituitary mass. No fluid in the sinuses, middle ears or mastoids. No skull or skullbase lesion. There is flow in the major vessels at the base of the brain. Major venous sinuses show flow. After contrast administration, no abnormal enhancement occurs.  IMPRESSION: Normal examination.  No metastatic disease.   Electronically Signed   By: Nelson Chimes M.D.   On: 03/13/2014 12:24   ASSESSMENT AND PLAN: This is a very pleasant 53 years old [place] American female recently diagnosed with limited stage small cell lung cancer. She status post one fraction of radiotherapy to the mediastinum under the care of Dr. Pablo Ledger. I have a lengthy discussion with the patient and her friend today about her current disease stage, prognosis and treatment options. I recommended for the patient treatment with  systemic chemotherapy in the form of cisplatin 60 mg/M2 on day 1 and etoposide 120 mg/M2 on days 1, 2 and 3 with Neulasta support on day 4. The patient was also given him the option of palliative care and hospice but she is interested in proceeding with chemotherapy. I discussed with the patient adverse effect of the chemotherapy including but not limited to alopecia, myelosuppression, nausea and vomiting, peripheral neuropathy, liver or renal dysfunction as well as hearing deficit. I will arrange for the patient to have a chemotherapy education class tomorrow before starting the first cycle of her treatment on 03/18/2014. Her course of systemic chemotherapy will be concurrent with radiation and the patient  will need to see Dr. Pablo Ledger again for reevaluation and consideration of concurrent radiotherapy probably after the first cycle. I will call her pharmacy was prescription for Compazine 10 mg by mouth every 6 hours as needed for nausea. The patient would come back for followup visit in 2 weeks for reevaluation and management any adverse effect of her treatment. She was also given prescription for cranial prosthesis. She was advised to call immediately if she has any concerning symptoms in the interval. The patient voices understanding of current disease status and treatment options and is in agreement with the current care plan.  All questions were answered. The patient knows to call the clinic with any problems, questions or concerns. We can certainly see the patient much sooner if necessary.  I spent 30 minutes counseling the patient face to face. The total time spent in the appointment was 40 minutes.  Disclaimer: This note was dictated with voice recognition software. Similar sounding words can inadvertently be transcribed and may not be corrected upon review.

## 2014-03-16 NOTE — Telephone Encounter (Signed)
Gave pt appt for lab,md, chemo class, nutrition class, talked to [facility] regarding chemo appt for 4/8 , pt will see Dr Julien Nordmann on his day off

## 2014-03-16 NOTE — Patient Instructions (Signed)
Smoking Cessation, Tips for Success If you are ready to quit smoking, congratulations! You have chosen to help yourself be healthier. Cigarettes bring nicotine, tar, carbon monoxide, and other irritants into your body. Your lungs, heart, and blood vessels will be able to work better without these poisons. There are many different ways to quit smoking. Nicotine gum, nicotine patches, a nicotine inhaler, or nicotine nasal spray can help with physical craving. Hypnosis, support groups, and medicines help break the habit of smoking. WHAT THINGS CAN I DO TO MAKE QUITTING EASIER?  Here are some tips to help you quit for good:  Pick a date when you will quit smoking completely. Tell all of your friends and family about your plan to quit on that date.  Do not try to slowly cut down on the number of cigarettes you are smoking. Pick a quit date and quit smoking completely starting on that day.  Throw away all cigarettes.   Clean and remove all ashtrays from your home, work, and car.   On a card, write down your reasons for quitting. Carry the card with you and read it when you get the urge to smoke.   Cleanse your body of nicotine. Drink enough water and fluids to keep your urine clear or pale yellow. Do this after quitting to flush the nicotine from your body.   Learn to predict your moods. Do not let a bad situation be your excuse to have a cigarette. Some situations in your life might tempt you into wanting a cigarette.   Never have "just one" cigarette. It leads to wanting another and another. Remind yourself of your decision to quit.   Change habits associated with smoking. If you smoked while driving or when feeling stressed, try other activities to replace smoking. Stand up when drinking your coffee. Brush your teeth after eating. Sit in a different chair when you read the paper. Avoid alcohol while trying to quit, and try to drink fewer caffeinated beverages. Alcohol and caffeine may urge  you to smoke.   Avoid foods and drinks that can trigger a desire to smoke, such as sugary or spicy foods and alcohol.   Ask people who smoke not to smoke around you.   Have something planned to do right after eating or having a cup of coffee. For example, plan to take a walk or exercise.   Try a relaxation exercise to calm you down and decrease your stress. Remember, you may be tense and nervous for the first 2 weeks after you quit, but this will pass.   Find new activities to keep your hands busy. Play with a pen, coin, or rubber band. Doodle or draw things on paper.   Brush your teeth right after eating. This will help cut down on the craving for the taste of tobacco after meals. You can also try mouthwash.   Use oral substitutes in place of cigarettes. Try using lemon drops, carrots, cinnamon sticks, or chewing gum. Keep them handy so they are available when you have the urge to smoke.   When you have the urge to smoke, try deep breathing.   Designate your home as a nonsmoking area.   If you are a heavy smoker, ask your health care provider about a prescription for nicotine chewing gum. It can ease your withdrawal from nicotine.   Reward yourself. Set aside the cigarette money you save and buy yourself something nice.   Look for support from others. Join a support group or   smoking cessation program. Ask someone at home or at work to help you with your plan to quit smoking.   Always ask yourself, "Do I need this cigarette or is this just a reflex?" Tell yourself, "Today, I choose not to smoke," or "I do not want to smoke." You are reminding yourself of your decision to quit.  Do not replace cigarette smoking with electronic cigarettes (commonly called e-cigarettes). The safety of e-cigarettes is unknown, and some may contain harmful chemicals.  If you relapse, do not give up! Plan ahead and think about what you will do the next time you get the urge to smoke.  HOW WILL  I FEEL WHEN I QUIT SMOKING? You may have symptoms of withdrawal because your body is used to nicotine (the addictive substance in cigarettes). You may crave cigarettes, be irritable, feel very hungry, cough often, get headaches, or have difficulty concentrating. The withdrawal symptoms are only temporary. They are strongest when you first quit but will go away within 10 14 days. When withdrawal symptoms occur, stay in control. Think about your reasons for quitting. Remind yourself that these are signs that your body is healing and getting used to being without cigarettes. Remember that withdrawal symptoms are easier to treat than the major diseases that smoking can cause.  Even after the withdrawal is over, expect periodic urges to smoke. However, these cravings are generally short lived and will go away whether you smoke or not. Do not smoke!  WHAT RESOURCES ARE AVAILABLE TO HELP ME QUIT SMOKING? Your health care provider can direct you to community resources or hospitals for support, which may include:  Group support.  Education.  Hypnosis.  Therapy. Document Released: 08/25/2004 Document Revised: 09/17/2013 Document Reviewed: 05/15/2013 [employer] Patient Information 2014 [employer].  

## 2014-03-16 NOTE — Progress Notes (Signed)
[name] arrived today for what she thought was a 12:30pm Radiation Therapy appointment.  She was discharged this weekend and her AVS showed numerous, future, Radiation Therapy appointments in [facility].  Reviewed documentation by Dr. Pablo Ledger which stated she was to receive 1 fraction, 5 gray, to her mediastinum, given on 03/13/14. Confirmed with Tiburcio Pea, Physicist, that the plan was for only one fraction via [place].  Talked with [name] and her sister to explain the plan for the 1 fraction, followed by chemotherapy for a few cycles, with the resumption of Radiation Therapy.  She stated understanding.  Given this RN's card with encouragement that she can call at any time as needed.

## 2014-03-17 ENCOUNTER — Telehealth: Payer: Self-pay | Admitting: Internal Medicine

## 2014-03-17 ENCOUNTER — Other Ambulatory Visit: Payer: BC Managed Care – PPO

## 2014-03-17 ENCOUNTER — Encounter: Payer: Self-pay | Admitting: *Deleted

## 2014-03-17 ENCOUNTER — Ambulatory Visit: Payer: BC Managed Care – PPO

## 2014-03-17 ENCOUNTER — Other Ambulatory Visit: Payer: Self-pay | Admitting: Internal Medicine

## 2014-03-17 NOTE — Telephone Encounter (Signed)
call pt and advised on appt tomorrow....pt will get completed sched at [place] visit

## 2014-03-18 ENCOUNTER — Ambulatory Visit (HOSPITAL_BASED_OUTPATIENT_CLINIC_OR_DEPARTMENT_OTHER): Payer: BC Managed Care – PPO

## 2014-03-18 ENCOUNTER — Ambulatory Visit: Payer: BC Managed Care – PPO

## 2014-03-18 ENCOUNTER — Other Ambulatory Visit: Payer: Self-pay

## 2014-03-18 ENCOUNTER — Telehealth: Payer: Self-pay | Admitting: Internal Medicine

## 2014-03-18 VITALS — BP 133/94 | HR 102 | Temp 98.4°F | Resp 18

## 2014-03-18 DIAGNOSIS — Z5111 Encounter for antineoplastic chemotherapy: Secondary | ICD-10-CM

## 2014-03-18 DIAGNOSIS — C3490 Malignant neoplasm of unspecified part of unspecified bronchus or lung: Secondary | ICD-10-CM

## 2014-03-18 DIAGNOSIS — C7A1 Malignant poorly differentiated neuroendocrine tumors: Secondary | ICD-10-CM

## 2014-03-18 MED ORDER — PALONOSETRON HCL INJECTION 0.25 MG/5ML
0.2500 mg | Freq: Once | INTRAVENOUS | Status: AC
  Administered 2014-03-18: 0.25 mg via INTRAVENOUS

## 2014-03-18 MED ORDER — SODIUM CHLORIDE 0.9 % IV SOLN
Freq: Once | INTRAVENOUS | Status: AC
  Administered 2014-03-18: 09:00:00 via INTRAVENOUS

## 2014-03-18 MED ORDER — DEXAMETHASONE SODIUM PHOSPHATE 20 MG/5ML IJ SOLN
12.0000 mg | Freq: Once | INTRAMUSCULAR | Status: AC
  Administered 2014-03-18: 12 mg via INTRAVENOUS

## 2014-03-18 MED ORDER — SODIUM CHLORIDE 0.9 % IV SOLN
120.0000 mg/m2 | Freq: Once | INTRAVENOUS | Status: AC
  Administered 2014-03-18: 170 mg via INTRAVENOUS
  Filled 2014-03-18: qty 8.5

## 2014-03-18 MED ORDER — POTASSIUM CHLORIDE 2 MEQ/ML IV SOLN
Freq: Once | INTRAVENOUS | Status: AC
  Administered 2014-03-18: 09:00:00 via INTRAVENOUS
  Filled 2014-03-18: qty 10

## 2014-03-18 MED ORDER — SODIUM CHLORIDE 0.9 % IV SOLN
60.0000 mg/m2 | Freq: Once | INTRAVENOUS | Status: AC
  Administered 2014-03-18: 87 mg via INTRAVENOUS
  Filled 2014-03-18: qty 87

## 2014-03-18 MED ORDER — PALONOSETRON HCL INJECTION 0.25 MG/5ML
INTRAVENOUS | Status: AC
  Filled 2014-03-18: qty 5

## 2014-03-18 MED ORDER — SODIUM CHLORIDE 0.9 % IV SOLN
150.0000 mg | Freq: Once | INTRAVENOUS | Status: AC
  Administered 2014-03-18: 150 mg via INTRAVENOUS
  Filled 2014-03-18: qty 5

## 2014-03-18 MED ORDER — DEXAMETHASONE SODIUM PHOSPHATE 20 MG/5ML IJ SOLN
INTRAMUSCULAR | Status: AC
  Filled 2014-03-18: qty 5

## 2014-03-18 NOTE — Patient Instructions (Signed)
[facility] Discharge Instructions for Patients Receiving Chemotherapy  Today you received the following chemotherapy agents:  Etoposide and Cisplatin  To help prevent nausea and vomiting after your treatment, we encourage you to take your nausea medication as ordered per MD.   If you develop nausea and vomiting that is not controlled by your nausea medication, call the clinic.   BELOW ARE SYMPTOMS THAT SHOULD BE REPORTED IMMEDIATELY:  *FEVER GREATER THAN 100.5 F  *CHILLS WITH OR WITHOUT FEVER  NAUSEA AND VOMITING THAT IS NOT CONTROLLED WITH YOUR NAUSEA MEDICATION  *UNUSUAL SHORTNESS OF BREATH  *UNUSUAL BRUISING OR BLEEDING  TENDERNESS IN MOUTH AND THROAT WITH OR WITHOUT PRESENCE OF ULCERS  *URINARY PROBLEMS  *BOWEL PROBLEMS  UNUSUAL RASH Items with * indicate a potential emergency and should be followed up as soon as possible.  Feel free to call the clinic you have any questions or concerns. The clinic phone number is [phone].

## 2014-03-18 NOTE — Telephone Encounter (Signed)
Gave pty appt for lab,md and chemo for April and MAy 2015

## 2014-03-19 ENCOUNTER — Ambulatory Visit: Payer: BC Managed Care – PPO | Admitting: Nutrition

## 2014-03-19 ENCOUNTER — Ambulatory Visit: Payer: BC Managed Care – PPO

## 2014-03-19 ENCOUNTER — Ambulatory Visit (HOSPITAL_BASED_OUTPATIENT_CLINIC_OR_DEPARTMENT_OTHER): Payer: BC Managed Care – PPO

## 2014-03-19 ENCOUNTER — Ambulatory Visit: Payer: BC Managed Care – PPO | Admitting: Internal Medicine

## 2014-03-19 VITALS — BP 157/96 | HR 94 | Temp 97.4°F | Resp 18

## 2014-03-19 DIAGNOSIS — C3490 Malignant neoplasm of unspecified part of unspecified bronchus or lung: Secondary | ICD-10-CM

## 2014-03-19 DIAGNOSIS — Z5111 Encounter for antineoplastic chemotherapy: Secondary | ICD-10-CM

## 2014-03-19 DIAGNOSIS — C7A1 Malignant poorly differentiated neuroendocrine tumors: Secondary | ICD-10-CM

## 2014-03-19 MED ORDER — SODIUM CHLORIDE 0.9 % IV SOLN
Freq: Once | INTRAVENOUS | Status: AC
  Administered 2014-03-19: 14:00:00 via INTRAVENOUS

## 2014-03-19 MED ORDER — SODIUM CHLORIDE 0.9 % IV SOLN
120.0000 mg/m2 | Freq: Once | INTRAVENOUS | Status: AC
  Administered 2014-03-19: 170 mg via INTRAVENOUS
  Filled 2014-03-19: qty 8.5

## 2014-03-19 MED ORDER — DEXAMETHASONE SODIUM PHOSPHATE 10 MG/ML IJ SOLN
INTRAMUSCULAR | Status: AC
  Filled 2014-03-19: qty 1

## 2014-03-19 MED ORDER — DEXAMETHASONE SODIUM PHOSPHATE 10 MG/ML IJ SOLN
10.0000 mg | Freq: Once | INTRAMUSCULAR | Status: AC
  Administered 2014-03-19: 10 mg via INTRAVENOUS

## 2014-03-19 NOTE — Progress Notes (Signed)
53 year old female diagnosed with small cell lung cancer in January, 2015.  She is a patient of Dr. [name].  Past medical history includes hypertension, asthma, and anxiety.  Medications include Lipitor, and Compazine.  Labs include glucose 168 on April 6.  Height: 4 feet 11 inches. Weight: 111 pounds April 6. Usual body weight: 123 pounds in January 2015. BMI: 22.41.  Patient reports that her appetite and oral intake decreased when she was diagnosed with pneumonia.  She states her appetite is improving at this time and she believes she is eating more.  She denies other nutrition side effects.  She does have lactose intolerance and states that regular boost sometimes bothers her stomach.  She enjoys boost breeze.  She would like ordering information for boost breeze.  Nutrition diagnosis: Unintended weight loss related to new diagnosis of small cell lung cancer and pneumonia.  As evidenced by 10% weight loss in 3 months.  Intervention: Patient education on strategies for improving appetite.  Patient was encouraged to consume small, frequent meals with high protein foods.  Recommended patient consume oral nutrition supplements twice a day between meals.  Provided ordering information/purchasing information on boost breeze.  Provided fact sheets.  Questions were answered.  Teach back method used.  Monitoring, evaluation, goals: Patient will tolerate increased calories and protein to minimize further weight loss.  Next visit: Wednesday, April 29, during chemotherapy.

## 2014-03-19 NOTE — Progress Notes (Signed)
  Radiation Oncology         [phone] ________________________________  Name: Brittany [name] MRN: [id number]  Date: 03/13/2014  DOB: 09/07/1961  End of Treatment Note  Diagnosis:   Small Cell Lung Cancer     Indication for treatment:  Palliative       Radiation treatment dates:   03/13/2014  Site/dose:   Right hilum and subcarinal lymph nodes to 5 Gy in a single fraction  Beams/energy:   AP/PA/ 15 MV photons  Narrative: The patient tolerated radiation treatment relatively well.  She was discharged rom the hospital  Plan: The patient has completed radiation treatment. The patient will return to radiation oncology clinic for routine followup in one month to evaluate for further treatment once her staging is complete. I advised them to call or return sooner if they have any questions or concerns related to their recovery or treatment.  ------------------------------------------------  [name], MD

## 2014-03-19 NOTE — Patient Instructions (Signed)
[place] Discharge Instructions for Patients Receiving Chemotherapy  Today you received the following chemotherapy agents VP 16 (Etoposide) To help prevent nausea and vomiting after your treatment, we encourage you to take your nausea medication as prescribed.  If you develop nausea and vomiting that is not controlled by your nausea medication, call the clinic.   BELOW ARE SYMPTOMS THAT SHOULD BE REPORTED IMMEDIATELY:  *FEVER GREATER THAN 100.5 F  *CHILLS WITH OR WITHOUT FEVER  NAUSEA AND VOMITING THAT IS NOT CONTROLLED WITH YOUR NAUSEA MEDICATION  *UNUSUAL SHORTNESS OF BREATH  *UNUSUAL BRUISING OR BLEEDING  TENDERNESS IN MOUTH AND THROAT WITH OR WITHOUT PRESENCE OF ULCERS  *URINARY PROBLEMS  *BOWEL PROBLEMS  UNUSUAL RASH Items with * indicate a potential emergency and should be followed up as soon as possible.  Feel free to call the clinic you have any questions or concerns. The clinic phone number is [phone].

## 2014-03-20 ENCOUNTER — Ambulatory Visit (HOSPITAL_BASED_OUTPATIENT_CLINIC_OR_DEPARTMENT_OTHER): Payer: BC Managed Care – PPO

## 2014-03-20 ENCOUNTER — Ambulatory Visit: Payer: BC Managed Care – PPO

## 2014-03-20 VITALS — BP 138/75 | HR 84 | Temp 98.5°F | Resp 18

## 2014-03-20 DIAGNOSIS — C3490 Malignant neoplasm of unspecified part of unspecified bronchus or lung: Secondary | ICD-10-CM

## 2014-03-20 DIAGNOSIS — C7A1 Malignant poorly differentiated neuroendocrine tumors: Secondary | ICD-10-CM

## 2014-03-20 DIAGNOSIS — Z5111 Encounter for antineoplastic chemotherapy: Secondary | ICD-10-CM

## 2014-03-20 MED ORDER — SODIUM CHLORIDE 0.9 % IV SOLN
120.0000 mg/m2 | Freq: Once | INTRAVENOUS | Status: AC
  Administered 2014-03-20: 170 mg via INTRAVENOUS
  Filled 2014-03-20: qty 8.5

## 2014-03-20 MED ORDER — SODIUM CHLORIDE 0.9 % IV SOLN
Freq: Once | INTRAVENOUS | Status: AC
  Administered 2014-03-20: 14:00:00 via INTRAVENOUS

## 2014-03-20 MED ORDER — DEXAMETHASONE SODIUM PHOSPHATE 10 MG/ML IJ SOLN
10.0000 mg | Freq: Once | INTRAMUSCULAR | Status: AC
  Administered 2014-03-20: 10 mg via INTRAVENOUS

## 2014-03-20 MED ORDER — DEXAMETHASONE SODIUM PHOSPHATE 10 MG/ML IJ SOLN
INTRAMUSCULAR | Status: AC
  Filled 2014-03-20: qty 1

## 2014-03-20 NOTE — Patient Instructions (Signed)
[facility] Discharge Instructions for Patients Receiving Chemotherapy  Today you received the following chemotherapy agents: Etoposide   To help prevent nausea and vomiting after your treatment, we encourage you to take your nausea medication as prescribed.    If you develop nausea and vomiting that is not controlled by your nausea medication, call the clinic.   BELOW ARE SYMPTOMS THAT SHOULD BE REPORTED IMMEDIATELY:  *FEVER GREATER THAN 100.5 F  *CHILLS WITH OR WITHOUT FEVER  NAUSEA AND VOMITING THAT IS NOT CONTROLLED WITH YOUR NAUSEA MEDICATION  *UNUSUAL SHORTNESS OF BREATH  *UNUSUAL BRUISING OR BLEEDING  TENDERNESS IN MOUTH AND THROAT WITH OR WITHOUT PRESENCE OF ULCERS  *URINARY PROBLEMS  *BOWEL PROBLEMS  UNUSUAL RASH Items with * indicate a potential emergency and should be followed up as soon as possible.  Feel free to call the clinic you have any questions or concerns. The clinic phone number is [phone].

## 2014-03-21 ENCOUNTER — Ambulatory Visit (HOSPITAL_BASED_OUTPATIENT_CLINIC_OR_DEPARTMENT_OTHER): Payer: BC Managed Care – PPO

## 2014-03-21 VITALS — BP 165/90 | HR 77 | Temp 97.8°F | Resp 18

## 2014-03-21 DIAGNOSIS — Z5189 Encounter for other specified aftercare: Secondary | ICD-10-CM

## 2014-03-21 DIAGNOSIS — C7A1 Malignant poorly differentiated neuroendocrine tumors: Secondary | ICD-10-CM

## 2014-03-21 MED ORDER — PEGFILGRASTIM INJECTION 6 MG/0.6ML
6.0000 mg | Freq: Once | SUBCUTANEOUS | Status: AC
Start: 2014-03-21 — End: 2014-03-21
  Administered 2014-03-21: 6 mg via SUBCUTANEOUS

## 2014-03-21 NOTE — Patient Instructions (Signed)

## 2014-03-23 ENCOUNTER — Telehealth: Payer: Self-pay | Admitting: *Deleted

## 2014-03-23 ENCOUNTER — Other Ambulatory Visit: Payer: Self-pay | Admitting: Medical Oncology

## 2014-03-23 ENCOUNTER — Ambulatory Visit: Payer: BC Managed Care – PPO

## 2014-03-23 DIAGNOSIS — I878 Other specified disorders of veins: Secondary | ICD-10-CM

## 2014-03-23 NOTE — Telephone Encounter (Signed)
PT. IS EATING SMALL FREQUENT MEALS AND FORCING FLUIDS. NO NAUSEA OR VOMITING. NO ISSUES WITH PT.'S MOUTH. NO DIARRHEA OR CONSTIPATION SHE HAS HAD A GOOD BOWEL MOVEMENT IN THE PAST THREE DAYS. PT. HAS CHEMO MEDICATION SHEETS FOR REFERENCE. SHE IS AWARE TO CALL THIS OFFICE IF ANY PROBLEMS OR QUESTIONS ARISE. SHOULD THE OFFICE BE CLOSE PT. KNOWS TO CONTACT THE ON CALL PHYSICIAN.

## 2014-03-24 ENCOUNTER — Encounter: Payer: Self-pay | Admitting: Internal Medicine

## 2014-03-24 ENCOUNTER — Ambulatory Visit: Payer: BC Managed Care – PPO

## 2014-03-24 NOTE — Progress Notes (Signed)
Per [employer] will cover 100% for Aloxi.

## 2014-03-25 ENCOUNTER — Ambulatory Visit (HOSPITAL_COMMUNITY)
Admission: RE | Admit: 2014-03-25 | Discharge: 2014-03-25 | Disposition: A | Discharge status: Home / Self Care | Payer: BC Managed Care – PPO | Source: Ambulatory Visit | Attending: Internal Medicine | Admitting: Internal Medicine

## 2014-03-25 ENCOUNTER — Telehealth: Payer: Self-pay | Admitting: *Deleted

## 2014-03-25 ENCOUNTER — Ambulatory Visit: Payer: BC Managed Care – PPO

## 2014-03-25 ENCOUNTER — Other Ambulatory Visit: Payer: BC Managed Care – PPO

## 2014-03-25 DIAGNOSIS — R222 Localized swelling, mass and lump, trunk: Secondary | ICD-10-CM | POA: Insufficient documentation

## 2014-03-25 DIAGNOSIS — C3490 Malignant neoplasm of unspecified part of unspecified bronchus or lung: Secondary | ICD-10-CM | POA: Insufficient documentation

## 2014-03-25 LAB — GLUCOSE, CAPILLARY: GLUCOSE-CAPILLARY: 133 mg/dL — AB (ref 70–99)

## 2014-03-25 MED ORDER — FLUDEOXYGLUCOSE F - 18 (FDG) INJECTION
6.2000 | Freq: Once | INTRAVENOUS | Status: AC | PRN
  Administered 2014-03-25: 6.2 via INTRAVENOUS

## 2014-03-25 MED ORDER — LIDOCAINE-PRILOCAINE 2.5-2.5 % EX CREA: 1.0000 "application " | TOPICAL_CREAM | CUTANEOUS | Status: DC | PRN

## 2014-03-25 NOTE — Telephone Encounter (Signed)
Message copied by Britt Bottom on Wed Mar 25, 2014  8:57 AM ------      Message from: Curt Bears      Created: Sat Mar 21, 2014  8:03 PM      Regarding: RE: requests port       OK.      ----- Message -----         From: Anders Grant, RN         Sent: 03/20/2014   8:54 AM           To: Curt Bears, MD, Ardeen Garland, RN      Subject: requests port                                            Pt in infusion this week and is requesting a port.  Can we schedule this with IR?            Diane please f/u, thanks!      Steph       ------

## 2014-03-25 NOTE — Telephone Encounter (Signed)
Onc tx schedule filled out.  EMLA cream called into pharmacy.  SLJ

## 2014-03-26 ENCOUNTER — Ambulatory Visit
Admission: RE | Admit: 2014-03-26 | Discharge: 2014-03-26 | Disposition: A | Discharge status: Home / Self Care | Payer: BC Managed Care – PPO | Source: Ambulatory Visit | Attending: Radiation Oncology | Admitting: Radiation Oncology

## 2014-03-26 ENCOUNTER — Ambulatory Visit: Payer: BC Managed Care – PPO

## 2014-03-26 VITALS — BP 127/76 | HR 88 | Temp 97.9°F | Resp 20 | Wt 114.5 lb

## 2014-03-26 DIAGNOSIS — C3490 Malignant neoplasm of unspecified part of unspecified bronchus or lung: Secondary | ICD-10-CM

## 2014-03-26 NOTE — Progress Notes (Signed)
Patient here for routine follow up completion of 1 radiation treatment.Tolarated well. Denies pain.No shortness of breath.Started chemotherapy last week.Awaiting schedule for insertion of porta-cath.

## 2014-03-26 NOTE — Progress Notes (Signed)
[facility] Simulation and Treatment Planning Note   Name: Brittany [name] MRN: [id number]  Date: 03/26/2014  DOB: 1961-09-05  Status: outpatient    DIAGNOSIS: Limited stage small cell lung cancer    SIDE: right   CONSENT VERIFIED: yes   SET UP AND IMMOBILIZATION: Patient is setup supine with arms in a wing board.   NARRATIVE: The patient was brought to the [facility].  Identity was confirmed.  All relevant records and images related to the planned course of therapy were reviewed.  Then, the patient was positioned in a stable reproducible clinical set-up for radiation therapy.  CT images were obtained.  Skin markings were placed.  The CT images were loaded into the planning software where the target and avoidance structures were contoured.  The radiation prescription was entered and confirmed.   TREATMENT PLANNING NOTE:  Treatment planning then occurred. I have requested 3D simulation with [facility] of the spinal cord, total lungs and gross tumor volume. I have also requested mlcs and an isodose plan.   Special treatment procedure will be performed as Kassity Woodson will be receiving concurrent chemotherapy.   I have ordered a consult with the dietician for monitoring.  I will also be verifying that weekly lab values are appropriate.

## 2014-03-26 NOTE — Progress Notes (Signed)
Department of Radiation Oncology  Phone:  [phone] Fax:        [phone]   Name: Brittany [name] MRN: [id number]  DOB: [birth year]  Date: 03/26/2014  Follow Up Visit Note  Diagnosis: Limited stage small cell lung cancer  Summary and Interval since last radiation: 5 gray x1 completed 2 weeks ago  Interval History: Brittany [name] presents today for routine followup.  She is feeling well and doing well. She had a PET scan which showed uptake in a right lung nodule as well as uptake in the mediastinum and right hilum. No evidence of metastatic disease was noted. Her airway seemed slightly more open as I reviewed the CT scan images than her previous CT scan done while she was an inpatient. She is scheduled for next chemotherapy April 29. She has a Port-A-Cath order in. She has really noticed is an increase in her energy levels. But she still has no symptoms of cough shortness of breath or hemoptysis.  Allergies:  Allergies  Allergen Reactions  . Codeine Nausea And Vomiting  . Iodinated Diagnostic Agents Hives and Rash  . Sulfa Antibiotics Hives and Rash    Medications:  Current Outpatient Prescriptions  Medication Sig Dispense Refill  . albuterol (PROVENTIL HFA) 108 (90 BASE) MCG/ACT inhaler Inhale 2 puffs into the lungs every 4 (four) hours as needed for wheezing or shortness of breath.  6.7 each  5  . albuterol (PROVENTIL) (2.5 MG/3ML) 0.083% nebulizer solution Take 3 mLs (2.5 mg total) by nebulization every 6 (six) hours as needed for wheezing.  150 mL  1  . atorvastatin (LIPITOR) 20 MG tablet Take 1 tablet (20 mg total) by mouth daily.  90 tablet  0  . budesonide-formoterol (SYMBICORT) 160-4.5 MCG/ACT inhaler Inhale 2 puffs into the lungs 2 (two) times daily.      Marland Kitchen diltiazem (CARDIZEM CD) 120 MG 24 hr capsule Take 1 capsule (120 mg total) by mouth daily.  30 capsule  0  . feeding supplement, RESOURCE BREEZE, (RESOURCE BREEZE) LIQD Take 1 Container by mouth 2 (two) times  daily between meals.  30 Container  0  . fluticasone (FLONASE) 50 MCG/ACT nasal spray Place 2 sprays into both nostrils daily.      Marland Kitchen lisinopril (PRINIVIL,ZESTRIL) 10 MG tablet Take 10 mg by mouth daily.      . metoprolol succinate (TOPROL-XL) 50 MG 24 hr tablet Take 1 tablet (50 mg total) by mouth daily. Take with or immediately following a meal.  30 tablet  3  . chlorpheniramine-HYDROcodone (TUSSIONEX PENNKINETIC ER) 10-8 MG/5ML LQCR Take 5 mLs by mouth every 12 (twelve) hours as needed.  60 mL  0  . lidocaine-prilocaine (EMLA) cream Apply 1 application topically as needed. Apply to port 1 hr before chemo  30 g  0  . prochlorperazine (COMPAZINE) 10 MG tablet Take 1 tablet (10 mg total) by mouth every 6 (six) hours as needed for nausea or vomiting.  60 tablet  0   No current facility-administered medications for this encounter.    Physical Exam:  Filed Vitals:   03/26/14 1428  BP: 127/76  Pulse: 88  Temp: 97.9 F (36.6 C)  Resp: 20  Weight: 114 lb 8 oz (51.937 kg)  SpO2: 99%   pleasant female in no distress sitting comfortably on examining room table  IMPRESSION: Brittany [name] is a 53 y.o. female status post palliative radiation to the central lung mass  PLAN:  In the absence of metastatic disease we should proceed forward with definitive  chemoradiation. I have scheduled her for simulation later today. We discussed dysphasia and esophagitis again. We discussed 25-28 treatments as an outpatient given her previous radiation dose. We will plan on starting with her next round of chemotherapy.    [name], MD

## 2014-03-27 ENCOUNTER — Other Ambulatory Visit (HOSPITAL_BASED_OUTPATIENT_CLINIC_OR_DEPARTMENT_OTHER): Payer: BC Managed Care – PPO

## 2014-03-27 ENCOUNTER — Ambulatory Visit (HOSPITAL_BASED_OUTPATIENT_CLINIC_OR_DEPARTMENT_OTHER): Payer: BC Managed Care – PPO | Admitting: Internal Medicine

## 2014-03-27 ENCOUNTER — Encounter: Payer: Self-pay | Admitting: Internal Medicine

## 2014-03-27 VITALS — BP 115/68 | HR 93 | Temp 98.1°F | Resp 18 | Ht 59.0 in | Wt 114.8 lb

## 2014-03-27 DIAGNOSIS — C3490 Malignant neoplasm of unspecified part of unspecified bronchus or lung: Secondary | ICD-10-CM

## 2014-03-27 DIAGNOSIS — C7A1 Malignant poorly differentiated neuroendocrine tumors: Secondary | ICD-10-CM

## 2014-03-27 LAB — COMPREHENSIVE METABOLIC PANEL (CC13)
ALBUMIN: 3.6 g/dL (ref 3.5–5.0)
ALT: 24 U/L (ref 0–55)
AST: 13 U/L (ref 5–34)
Alkaline Phosphatase: 133 U/L (ref 40–150)
Anion Gap: 10 mEq/L (ref 3–11)
BILIRUBIN TOTAL: 0.3 mg/dL (ref 0.20–1.20)
BUN: 10.4 mg/dL (ref 7.0–26.0)
CO2: 24 mEq/L (ref 22–29)
CREATININE: 0.7 mg/dL (ref 0.6–1.1)
Calcium: 9.4 mg/dL (ref 8.4–10.4)
Chloride: 101 mEq/L (ref 98–109)
GLUCOSE: 225 mg/dL — AB (ref 70–140)
Potassium: 4.1 mEq/L (ref 3.5–5.1)
Sodium: 136 mEq/L (ref 136–145)
Total Protein: 6.5 g/dL (ref 6.4–8.3)

## 2014-03-27 LAB — CBC WITH DIFFERENTIAL/PLATELET
BASO%: 0.5 % (ref 0.0–2.0)
BASOS ABS: 0 10*3/uL (ref 0.0–0.1)
EOS ABS: 0.3 10*3/uL (ref 0.0–0.5)
EOS%: 6.1 % (ref 0.0–7.0)
HCT: 48.6 % — ABNORMAL HIGH (ref 34.8–46.6)
HEMOGLOBIN: 16.2 g/dL — AB (ref 11.6–15.9)
LYMPH%: 29.2 % (ref 14.0–49.7)
MCH: 32.5 pg (ref 25.1–34.0)
MCHC: 33.3 g/dL (ref 31.5–36.0)
MCV: 97.4 fL (ref 79.5–101.0)
MONO#: 0.8 10*3/uL (ref 0.1–0.9)
MONO%: 14.5 % — AB (ref 0.0–14.0)
NEUT#: 2.8 10*3/uL (ref 1.5–6.5)
NEUT%: 49.7 % (ref 38.4–76.8)
PLATELETS: 63 10*3/uL — AB (ref 145–400)
RBC: 4.99 10*6/uL (ref 3.70–5.45)
RDW: 21 % — ABNORMAL HIGH (ref 11.2–14.5)
WBC: 5.6 10*3/uL (ref 3.9–10.3)
lymph#: 1.6 10*3/uL (ref 0.9–3.3)

## 2014-03-27 LAB — MAGNESIUM (CC13): MAGNESIUM: 1.7 mg/dL (ref 1.5–2.5)

## 2014-03-27 NOTE — Progress Notes (Signed)
[facility] [phone]   [phone]  OFFICE PROGRESS NOTE  Pcp Not In System No address on file  DIAGNOSIS: Limited stage small cell lung cancer diagnosed in March of 2015.  PRIOR THERAPY: Single fraction of radiotherapy to the mediastinum under the care of Dr. Pablo Ledger on 03/13/2014.  CURRENT THERAPY: Systemic chemotherapy with cisplatin 60 mg/M2 day 1 and etoposide 120 mg/M2 on days 1, 2 and 3 with Neulasta support on day 4. First dose on 03/18/2014. Status post 1 cycle.   INTERVAL HISTORY: Brittany [name] 53 y.o. female returns to the clinic today for followup visit accompanied by her sister. The patient is feeling fine today with no specific complaints. She denied having any significant chest pain, shortness of breath, cough or hemoptysis. The patient denied having any significant fever or chills no nausea or vomiting. She tolerated the first week of her systemic chemotherapy with cisplatin and etoposide fairly well with no significant adverse effects.  MEDICAL HISTORY: Past Medical History  Diagnosis Date  . Hypertension   . Allergy   . Asthma   . Anxiety   . Small cell lung cancer 03/16/2014    ALLERGIES:  is allergic to codeine; iodinated diagnostic agents; and sulfa antibiotics.  MEDICATIONS:  Current Outpatient Prescriptions  Medication Sig Dispense Refill  . albuterol (PROVENTIL HFA) 108 (90 BASE) MCG/ACT inhaler Inhale 2 puffs into the lungs every 4 (four) hours as needed for wheezing or shortness of breath.  6.7 each  5  . atorvastatin (LIPITOR) 20 MG tablet Take 1 tablet (20 mg total) by mouth daily.  90 tablet  0  . budesonide-formoterol (SYMBICORT) 160-4.5 MCG/ACT inhaler Inhale 2 puffs into the lungs 2 (two) times daily.      Marland Kitchen diltiazem (CARDIZEM CD) 120 MG 24 hr capsule Take 1 capsule (120 mg total) by mouth daily.  30 capsule  0  . feeding supplement, RESOURCE BREEZE, (RESOURCE BREEZE) LIQD Take 1 Container by  mouth 2 (two) times daily between meals.  30 Container  0  . fluticasone (FLONASE) 50 MCG/ACT nasal spray Place 2 sprays into both nostrils daily.      Marland Kitchen lidocaine-prilocaine (EMLA) cream Apply 1 application topically as needed. Apply to port 1 hr before chemo  30 g  0  . lisinopril (PRINIVIL,ZESTRIL) 10 MG tablet Take 10 mg by mouth daily.      . metoprolol succinate (TOPROL-XL) 50 MG 24 hr tablet Take 1 tablet (50 mg total) by mouth daily. Take with or immediately following a meal.  30 tablet  3  . albuterol (PROVENTIL) (2.5 MG/3ML) 0.083% nebulizer solution Take 3 mLs (2.5 mg total) by nebulization every 6 (six) hours as needed for wheezing.  150 mL  1  . chlorpheniramine-HYDROcodone (TUSSIONEX PENNKINETIC ER) 10-8 MG/5ML LQCR Take 5 mLs by mouth every 12 (twelve) hours as needed.  60 mL  0  . prochlorperazine (COMPAZINE) 10 MG tablet Take 1 tablet (10 mg total) by mouth every 6 (six) hours as needed for nausea or vomiting.  60 tablet  0   No current facility-administered medications for this visit.    SURGICAL HISTORY:  Past Surgical History  Procedure Laterality Date  . Tubal ligation    . Cesarean section    . Video bronchoscopy with endobronchial ultrasound N/A 03/11/2014    Procedure: VIDEO BRONCHOSCOPY WITH ENDOBRONCHIAL ULTRASOUND;  Surgeon: Melrose Nakayama, MD;  Location: [facility];  Service: Thoracic;  Laterality: N/A;  . Mediastinoscopy N/A  03/11/2014    Procedure: MEDIASTINOSCOPY;  Surgeon: Melrose Nakayama, MD;  Location: [facility];  Service: Thoracic;  Laterality: N/A;    REVIEW OF SYSTEMS:  Constitutional: positive for fatigue and weight loss Eyes: negative Ears, nose, mouth, throat, and face: negative Respiratory: negative Cardiovascular: negative Gastrointestinal: negative Genitourinary:negative Integument/breast: negative Hematologic/lymphatic: negative Musculoskeletal:negative Neurological: negative Behavioral/Psych: negative Endocrine:  negative Allergic/Immunologic: negative   PHYSICAL EXAMINATION: General appearance: alert, cooperative and no distress Head: Normocephalic, without obvious abnormality, atraumatic Neck: no adenopathy, no JVD, supple, symmetrical, trachea midline and thyroid not enlarged, symmetric, no tenderness/mass/nodules Lymph nodes: Cervical, supraclavicular, and axillary nodes normal. Resp: wheezes bilaterally Back: symmetric, no curvature. ROM normal. No CVA tenderness. Cardio: regular rate and rhythm, S1, S2 normal, no murmur, click, rub or gallop GI: soft, non-tender; bowel sounds normal; no masses,  no organomegaly Extremities: extremities normal, atraumatic, no cyanosis or edema Neurologic: Alert and oriented X 3, normal strength and tone. Normal symmetric reflexes. Normal coordination and gait  ECOG PERFORMANCE STATUS: 1 - Symptomatic but completely ambulatory  Blood pressure 115/68, pulse 93, temperature 98.1 F (36.7 C), temperature source Oral, resp. rate 18, height 4\' 11"  (1.499 m), weight 114 lb 12.8 oz (52.073 kg), SpO2 100.00%.  LABORATORY DATA: Lab Results  Component Value Date   WBC 5.6 03/27/2014   HGB 16.2* 03/27/2014   HCT 48.6* 03/27/2014   MCV 97.4 03/27/2014   PLT 63* 03/27/2014      Chemistry      Component Value Date/Time   NA 139 03/16/2014 1341   NA 138 03/12/2014 0530   K 3.8 03/16/2014 1341   K 4.3 03/12/2014 0530   CL 99 03/12/2014 0530   CO2 24 03/16/2014 1341   CO2 23 03/12/2014 0530   BUN 12.0 03/16/2014 1341   BUN 12 03/12/2014 0530   CREATININE 0.8 03/16/2014 1341   CREATININE 0.67 03/12/2014 0530   CREATININE 0.56 01/01/2014 1111      Component Value Date/Time   CALCIUM 10.1 03/16/2014 1341   CALCIUM 9.1 03/12/2014 0530   ALKPHOS 103 03/16/2014 1341   ALKPHOS 110 03/11/2014 1002   AST 24 03/16/2014 1341   AST 31 03/11/2014 1002   ALT 28 03/16/2014 1341   ALT 26 03/11/2014 1002   BILITOT 0.94 03/16/2014 1341   BILITOT 0.6 03/11/2014 1002       RADIOGRAPHIC STUDIES: Ct Abdomen  Pelvis Wo Contrast  03/13/2014   CLINICAL DATA:  Newly diagnosed lung cancer, evaluate for metastases  EXAM: CT ABDOMEN AND PELVIS WITHOUT CONTRAST  TECHNIQUE: Multidetector CT imaging of the abdomen and pelvis was performed following the standard protocol without intravenous contrast.  COMPARISON:  None.  FINDINGS: Lung bases are essentially clear.  Unenhanced liver, spleen, pancreas, and adrenal glands are within normal limits.  Gallbladder is underdistended. No intrahepatic or extrahepatic ductal dilatation.  Kidneys are unremarkable.  No renal calculi or hydronephrosis.  No evidence of bowel obstruction.  Normal appendix.  Atherosclerotic calcifications of the abdominal aorta and branch vessels.  No abdominopelvic ascites.  No suspicious abdominopelvic lymphadenopathy.  Status post hysterectomy.  No adnexal masses.  Bladder is within normal limits.  Visualized osseous structures are within normal limits.  IMPRESSION: No evidence of metastatic disease in the abdomen/pelvis.   Electronically Signed   By: Julian Hy M.D.   On: 03/13/2014 11:47   Chest 2 View  03/11/2014   CLINICAL DATA:  Known hilar and mediastinal mass  EXAM: CHEST  2 VIEW  COMPARISON:  03/05/2014  FINDINGS: Cardiac shadow is stable. The lungs are well aerated bilaterally. The left lung is clear. The known right hilar/ mediastinal mass is again identified. No definitive infiltrate is identified.  IMPRESSION: Stable right hilar/ mediastinal mass.   Electronically Signed   By: Inez Catalina M.D.   On: 03/11/2014 11:52   Dg Chest 2 View  03/05/2014   CLINICAL DATA:  Shortness of breath, chest pain  EXAM: CHEST  2 VIEW  COMPARISON:  March 04, 2014  FINDINGS: The previously noted right perihilar mass with associated postobstructive bronchopneumonia is unchanged. There is no pleural effusion. There is no pulmonary edema. The mediastinal contour and cardiac silhouette are stable. The soft tissues and osseous structures are normal.  IMPRESSION:  Previously noted right perihilar mass with associated postobstructive bronchopneumonia is unchanged.   Electronically Signed   By: Abelardo Diesel M.D.   On: 03/05/2014 12:18   Dg Chest 2 View  03/04/2014   CLINICAL DATA:  Shortness of breath, history of smoking  EXAM: CHEST  2 VIEW  COMPARISON:  DG CHEST 2V dated 02/24/2014; DG CHEST 2 VIEW dated 11/22/2011; DG CHEST 2 VIEW dated 05/25/2008  FINDINGS: Grossly unchanged cardiac silhouette and mediastinal contours with similar appearance of previously identified large right-sided perihilar mass with associated heterogeneous opacities radiating peripherally along the right major and minor fissures. No new focal airspace opacities. No pleural effusion or pneumothorax. No evidence of edema.  IMPRESSION: Grossly unchanged appearance of previously identified large right-sided perihilar mass with associated postobstructive infection. These findings are again worrisome for bronchogenic carcinoma and further evaluation with chest CT is again recommended.  These results will be called to the ordering clinician or representative by the Radiologist Assistant, and communication documented in the PACS Dashboard.   Electronically Signed   By: Sandi Mariscal M.D.   On: 03/04/2014 15:15   Ct Angio Chest Pe W/cm &/or Wo Cm  03/05/2014   CLINICAL DATA:  Shortness of breath.  EXAM: CT ANGIOGRAPHY CHEST WITH CONTRAST  TECHNIQUE: Multidetector CT imaging of the chest was performed using the standard protocol during bolus administration of intravenous contrast. Multiplanar CT image reconstructions and MIPs were obtained to evaluate the vascular anatomy.  CONTRAST:  21mL OMNIPAQUE IOHEXOL 350 MG/ML SOLN  COMPARISON:  DG CHEST 2 VIEW dated 03/05/2014; DG CHEST 2V dated 03/04/2014; DG CHEST 2V dated 02/24/2014  FINDINGS: Thoracic aorta unremarkable. No evidence of pulmonary embolus. There is narrowing of the right pulmonary artery secondary to large mediastinal and hilar mass. Cardiomegaly.  Small pericardial effusion.  Extensive mediastinal and hilar adenopathy and/or mass lesion is present. This results in narrowing of the right pulmonary artery and near complete occlusion of the right bronchus. The mass extends into the subcarinal region and into the left hilum. Associated left hilar adenopathy is most likely present. The mass conglomerate measures up to 8.1 x 6.5 x 7.3 cm. Visualized portions of the thoracic esophagus unremarkable.  Trachea and left mainstem bronchus are patent. Again there is near-complete occlusion of the right mainstem bronchus. Infiltrative changes are present in the right middle and right lower lobes. These changes are most likely related to postobstructive pneumonitis. No pneumothorax. No significant pleural effusion.  Adrenals normal.  Visualized upper abdominal viscera unremarkable.  Punctate lucency in the right thyroid lobe, most likely tiny cysts. Shotty bilateral axillary lymph nodes. Breast tissue is dense with asymmetric densities for which further evaluation with mammography suggested. No focal bony abnormalities identified.  Review of the MIP images confirms the above  findings.  IMPRESSION: 1. Large mediastinal and right hilar mass lesions with narrowing of the right pulmonary artery and near complete occlusion of the right mainstem bronchus. These changes are most likely malignant and related to a process such as primary lung cancer with metastatic lymphadenopathy, metastatic adenopathy from other primary, or lymphoma. PET-CT can be obtained for further evaluation. 2. No evidence of pulmonary embolus. Again the right pulmonary artery is narrowed secondary to the above described mass lesion. 3. Postobstructive right middle lobe and right lower lobe pneumonia. Again the right mainstem bronchus is almost completely occluded by the above-described mass. 4. Breasts are bilaterally dense with asymmetric densities, further evaluation with mammography suggested. 5.  Cardiomegaly with small pericardial effusion.   Electronically Signed   By: Marcello Moores  Register   On: 03/05/2014 15:53   Mr Jeri Cos Wo Contrast  03/13/2014   CLINICAL DATA:  New diagnosis lung cancer.  Staging.  EXAM: MRI HEAD WITHOUT AND WITH CONTRAST  TECHNIQUE: Multiplanar, multiecho pulse sequences of the brain and surrounding structures were obtained without and with intravenous contrast.  CONTRAST:  63mL MULTIHANCE GADOBENATE DIMEGLUMINE 529 MG/ML IV SOLN  COMPARISON:  Head CT 03/23/2004  FINDINGS: The brain has a normal appearance on all pulse sequences without evidence of malformation, atrophy, old or acute infarction, mass lesion, hemorrhage, hydrocephalus or extra-axial collection. No pituitary mass. No fluid in the sinuses, middle ears or mastoids. No skull or skullbase lesion. There is flow in the major vessels at the base of the brain. Major venous sinuses show flow. After contrast administration, no abnormal enhancement occurs.  IMPRESSION: Normal examination.  No metastatic disease.   Electronically Signed   By: Nelson Chimes M.D.   On: 03/13/2014 12:24   Nm Pet Image Initial (pi) Skull Base To Thigh  03/25/2014   CLINICAL DATA:  Initial treatment strategy for lung carcinoma. High-grade poorly differentiated neuroendocrine carcinoma, small cell carcinoma.  EXAM: NUCLEAR MEDICINE PET SKULL BASE TO THIGH  TECHNIQUE: 6.2 mCi F-18 FDG was injected intravenously. Full-ring PET imaging was performed from the skull base to thigh after the radiotracer. CT data was obtained and used for attenuation correction and anatomic localization.  FASTING BLOOD GLUCOSE:  Value: 133 mg/dl  COMPARISON:  CT ABD/PELV WO CM dated 03/13/2014; DG CHEST 2V dated 02/24/2014; DG CHEST 2 VIEW dated 11/22/2011; DG CHEST 2V dated 03/04/2014; CT ANGIO CHEST W/CM &/OR WO/CM dated 03/05/2014  FINDINGS: NECK  No hypermetabolic lymph nodes in the neck.  CHEST  Large intensely hypermetabolic right hilar mass surrounding the right mainstem  bronchus and extending into the mediastinum measures approximately 6.4 x 6.0 cm and has intense metabolic activity with SUV max equals 16.2. The is a separate distinct nodule within the superior segment of the right lower lobe measuring 16 x 19 mm with associated metabolic activity (SUV max 5.1)  ABDOMEN/PELVIS  No abnormal hypermetabolic activity within the liver, pancreas, adrenal glands, or spleen. No hypermetabolic lymph nodes in the abdomen or pelvis.  SKELETON  There is diffuse increase in marrow metabolic activity. There are several segments of thoracic spine with relatively low metabolic activity consistent with marrow suppression from radiation port and  IMPRESSION: 1. Hypermetabolic right hilar and mediastinal mass consistent with small cell lung cancer metastasis. 2. Hypermetabolic right lower lobe pulmonary nodule likely represents primary lesion. 3. No evidence of distant metastasis. 4. Diffuse increased marrow activity relates to Neulasta support.   Electronically Signed   By: Suzy Bouchard M.D.   On: 03/25/2014 14:34  Dg Chest Port 1 View  03/11/2014   CLINICAL DATA:  Status post bronchoscopy  EXAM: PORTABLE CHEST - 1 VIEW  COMPARISON:  Film from earlier in the same day.  FINDINGS: Prominent right perihilar density is noted consistent with the known history of mediastinal mass. No pneumothorax or pleural effusion is seen. No focal infiltrate is noted.  IMPRESSION: No evidence of pneumothorax following bronchoscopy.   Electronically Signed   By: Inez Catalina M.D.   On: 03/11/2014 16:43   ASSESSMENT AND PLAN: This is a very pleasant 53 years old [place] American female recently diagnosed with limited stage small cell lung cancer. She status post one fraction of radiotherapy to the mediastinum under the care of Dr. Pablo Ledger and the start of the first cycle of her systemic chemotherapy with cisplatin and etoposide last week. The patient has no evidence for disease metastasis based on the recent  PET scan and her final stage is consistent with limited stage disease. She tolerated the first week of her systemic chemotherapy fairly well with no significant adverse effects She was seen by Dr. Pablo Ledger and expected to start concurrent radiotherapy was chemotherapy in the next 2 weeks. The patient would come back for followup visit in 2 weeks for reevaluation and management any adverse effect of her treatment before starting cycle #2. She was advised to call immediately if she has any concerning symptoms in the interval. The patient voices understanding of current disease status and treatment options and is in agreement with the current care plan.  All questions were answered. The patient knows to call the clinic with any problems, questions or concerns. We can certainly see the patient much sooner if necessary.  Disclaimer: This note was dictated with voice recognition software. Similar sounding words can inadvertently be transcribed and may not be corrected upon review.

## 2014-03-31 ENCOUNTER — Telehealth: Payer: Self-pay | Admitting: Internal Medicine

## 2014-03-31 NOTE — Telephone Encounter (Signed)
S/w the pt and she is aware to pick up a revised April 2015 appt calendar when she comes in the office tomorrow.

## 2014-04-01 ENCOUNTER — Other Ambulatory Visit (HOSPITAL_BASED_OUTPATIENT_CLINIC_OR_DEPARTMENT_OTHER): Payer: BC Managed Care – PPO

## 2014-04-01 ENCOUNTER — Telehealth: Payer: Self-pay | Admitting: *Deleted

## 2014-04-01 ENCOUNTER — Telehealth: Payer: Self-pay | Admitting: Internal Medicine

## 2014-04-01 DIAGNOSIS — C7A1 Malignant poorly differentiated neuroendocrine tumors: Secondary | ICD-10-CM

## 2014-04-01 DIAGNOSIS — C3490 Malignant neoplasm of unspecified part of unspecified bronchus or lung: Secondary | ICD-10-CM

## 2014-04-01 LAB — CBC WITH DIFFERENTIAL/PLATELET
BASO%: 0.3 % (ref 0.0–2.0)
Basophils Absolute: 0 10*3/uL (ref 0.0–0.1)
EOS%: 0.7 % (ref 0.0–7.0)
Eosinophils Absolute: 0.1 10*3/uL (ref 0.0–0.5)
HCT: 48.8 % — ABNORMAL HIGH (ref 34.8–46.6)
HGB: 15.9 g/dL (ref 11.6–15.9)
LYMPH%: 14.4 % (ref 14.0–49.7)
MCH: 32.1 pg (ref 25.1–34.0)
MCHC: 32.5 g/dL (ref 31.5–36.0)
MCV: 98.8 fL (ref 79.5–101.0)
MONO#: 1.1 10*3/uL — ABNORMAL HIGH (ref 0.1–0.9)
MONO%: 7.3 % (ref 0.0–14.0)
NEUT#: 11.2 10*3/uL — ABNORMAL HIGH (ref 1.5–6.5)
NEUT%: 77.3 % — AB (ref 38.4–76.8)
Platelets: 121 10*3/uL — ABNORMAL LOW (ref 145–400)
RBC: 4.94 10*6/uL (ref 3.70–5.45)
RDW: 24 % — ABNORMAL HIGH (ref 11.2–14.5)
WBC: 14.5 10*3/uL — ABNORMAL HIGH (ref 3.9–10.3)
lymph#: 2.1 10*3/uL (ref 0.9–3.3)

## 2014-04-01 LAB — COMPREHENSIVE METABOLIC PANEL (CC13)
ALT: 27 U/L (ref 0–55)
ANION GAP: 10 meq/L (ref 3–11)
AST: 18 U/L (ref 5–34)
Albumin: 3.5 g/dL (ref 3.5–5.0)
Alkaline Phosphatase: 154 U/L — ABNORMAL HIGH (ref 40–150)
BUN: 12.1 mg/dL (ref 7.0–26.0)
CHLORIDE: 107 meq/L (ref 98–109)
CO2: 25 mEq/L (ref 22–29)
CREATININE: 0.7 mg/dL (ref 0.6–1.1)
Calcium: 10 mg/dL (ref 8.4–10.4)
Glucose: 148 mg/dl — ABNORMAL HIGH (ref 70–140)
Potassium: 3.6 mEq/L (ref 3.5–5.1)
SODIUM: 142 meq/L (ref 136–145)
TOTAL PROTEIN: 6.6 g/dL (ref 6.4–8.3)
Total Bilirubin: 0.26 mg/dL (ref 0.20–1.20)

## 2014-04-01 LAB — MAGNESIUM (CC13): Magnesium: 2 mg/dl (ref 1.5–2.5)

## 2014-04-01 NOTE — Telephone Encounter (Signed)
Tiffany from IR called.  Pt prefers to not get a port at this time.  SLJ

## 2014-04-01 NOTE — Telephone Encounter (Signed)
added additional tx for may. pt contacted yesterday re revised scheduled and to get new schedule today. also per response from MM re 4/29 f/u pt could see AJ w/the beginning of next cycle but AJ will see pt this cycle 4/29 in inf area.

## 2014-04-06 ENCOUNTER — Ambulatory Visit: Payer: BC Managed Care – PPO | Admitting: Radiation Oncology

## 2014-04-07 ENCOUNTER — Ambulatory Visit
Admission: RE | Admit: 2014-04-07 | Discharge: 2014-04-07 | Disposition: A | Discharge status: Home / Self Care | Payer: BC Managed Care – PPO | Source: Ambulatory Visit | Attending: Radiation Oncology | Admitting: Radiation Oncology

## 2014-04-07 ENCOUNTER — Encounter: Payer: Self-pay | Admitting: Radiation Oncology

## 2014-04-07 VITALS — BP 141/79 | HR 88 | Resp 16 | Wt 118.0 lb

## 2014-04-07 DIAGNOSIS — C3490 Malignant neoplasm of unspecified part of unspecified bronchus or lung: Secondary | ICD-10-CM

## 2014-04-07 MED ORDER — RADIAPLEXRX EX GEL
Freq: Once | CUTANEOUS | Status: AC
  Administered 2014-04-07: 17:00:00 via TOPICAL

## 2014-04-07 NOTE — Progress Notes (Signed)
Weekly Management Note Current Dose: 2Gy  Projected Dose:52 Gy   Narrative:  The patient presents for routine under treatment assessment.  CBCT/MVCT images/Port film x-rays were reviewed.  The chart was checked. Doing well. Feeling beter with more nergy and appetite.   Physical Findings:  Unchanged  Vitals:  Filed Vitals:   04/07/14 1641  BP: 141/79  Pulse: 88  Resp: 16   Weight:  Wt Readings from Last 3 Encounters:  04/07/14 118 lb (53.524 kg)  03/27/14 114 lb 12.8 oz (52.073 kg)  03/26/14 114 lb 8 oz (51.937 kg)   Lab Results  Component Value Date   WBC 14.5* 04/01/2014   HGB 15.9 04/01/2014   HCT 48.8* 04/01/2014   MCV 98.8 04/01/2014   PLT 121* 04/01/2014   Lab Results  Component Value Date   CREATININE 0.7 04/01/2014   BUN 12.1 04/01/2014   NA 142 04/01/2014   K 3.6 04/01/2014   CL 99 03/12/2014   CO2 25 04/01/2014     Impression:  The patient is tolerating radiation.  Plan:  Continue treatment as planned. RN education performed.

## 2014-04-07 NOTE — Progress Notes (Signed)
Oriented patient to staff and routine of the clinic. Provided patient with RADIATION THERAPY AND YOU handbook then, reviewed pertinent information. Provided patient with radiaplex gel and directed upon use. Patient denies skin changes at this time. Reports a dry cough. Vitals stable. Denies chest pain but, does report discomfort. Denies SOB or difficulty swallowing.

## 2014-04-08 ENCOUNTER — Ambulatory Visit (HOSPITAL_BASED_OUTPATIENT_CLINIC_OR_DEPARTMENT_OTHER): Payer: BC Managed Care – PPO

## 2014-04-08 ENCOUNTER — Ambulatory Visit: Payer: BC Managed Care – PPO | Admitting: Nutrition

## 2014-04-08 ENCOUNTER — Ambulatory Visit
Admission: RE | Admit: 2014-04-08 | Discharge: 2014-04-08 | Disposition: A | Discharge status: Home / Self Care | Payer: BC Managed Care – PPO | Source: Ambulatory Visit | Attending: Radiation Oncology | Admitting: Radiation Oncology

## 2014-04-08 ENCOUNTER — Encounter: Payer: Self-pay | Admitting: Physician Assistant

## 2014-04-08 ENCOUNTER — Other Ambulatory Visit (HOSPITAL_BASED_OUTPATIENT_CLINIC_OR_DEPARTMENT_OTHER): Payer: BC Managed Care – PPO

## 2014-04-08 ENCOUNTER — Ambulatory Visit (HOSPITAL_BASED_OUTPATIENT_CLINIC_OR_DEPARTMENT_OTHER): Payer: BC Managed Care – PPO | Admitting: Physician Assistant

## 2014-04-08 ENCOUNTER — Other Ambulatory Visit: Payer: BC Managed Care – PPO

## 2014-04-08 VITALS — BP 144/76 | HR 77 | Temp 98.2°F | Resp 18

## 2014-04-08 DIAGNOSIS — C3490 Malignant neoplasm of unspecified part of unspecified bronchus or lung: Secondary | ICD-10-CM

## 2014-04-08 DIAGNOSIS — C7A1 Malignant poorly differentiated neuroendocrine tumors: Secondary | ICD-10-CM

## 2014-04-08 DIAGNOSIS — Z5111 Encounter for antineoplastic chemotherapy: Secondary | ICD-10-CM

## 2014-04-08 LAB — CBC WITH DIFFERENTIAL/PLATELET
BASO%: 0.2 % (ref 0.0–2.0)
BASOS ABS: 0 10*3/uL (ref 0.0–0.1)
EOS%: 0.4 % (ref 0.0–7.0)
Eosinophils Absolute: 0.1 10*3/uL (ref 0.0–0.5)
HCT: 48.1 % — ABNORMAL HIGH (ref 34.8–46.6)
HEMOGLOBIN: 15.7 g/dL (ref 11.6–15.9)
LYMPH#: 1.1 10*3/uL (ref 0.9–3.3)
LYMPH%: 7 % — ABNORMAL LOW (ref 14.0–49.7)
MCH: 32.3 pg (ref 25.1–34.0)
MCHC: 32.7 g/dL (ref 31.5–36.0)
MCV: 98.7 fL (ref 79.5–101.0)
MONO#: 0.8 10*3/uL (ref 0.1–0.9)
MONO%: 5.1 % (ref 0.0–14.0)
NEUT#: 13.1 10*3/uL — ABNORMAL HIGH (ref 1.5–6.5)
NEUT%: 87.3 % — ABNORMAL HIGH (ref 38.4–76.8)
Platelets: 237 10*3/uL (ref 145–400)
RBC: 4.87 10*6/uL (ref 3.70–5.45)
RDW: 24.4 % — ABNORMAL HIGH (ref 11.2–14.5)
WBC: 15 10*3/uL — AB (ref 3.9–10.3)

## 2014-04-08 LAB — COMPREHENSIVE METABOLIC PANEL (CC13)
ALT: 29 U/L (ref 0–55)
AST: 19 U/L (ref 5–34)
Albumin: 3.6 g/dL (ref 3.5–5.0)
Alkaline Phosphatase: 145 U/L (ref 40–150)
Anion Gap: 11 mEq/L (ref 3–11)
BUN: 13.1 mg/dL (ref 7.0–26.0)
CALCIUM: 9.9 mg/dL (ref 8.4–10.4)
CHLORIDE: 109 meq/L (ref 98–109)
CO2: 21 mEq/L — ABNORMAL LOW (ref 22–29)
Creatinine: 0.7 mg/dL (ref 0.6–1.1)
GLUCOSE: 228 mg/dL — AB (ref 70–140)
POTASSIUM: 3.5 meq/L (ref 3.5–5.1)
Sodium: 141 mEq/L (ref 136–145)
Total Bilirubin: 0.57 mg/dL (ref 0.20–1.20)
Total Protein: 6.9 g/dL (ref 6.4–8.3)

## 2014-04-08 LAB — MAGNESIUM (CC13): MAGNESIUM: 1.8 mg/dL (ref 1.5–2.5)

## 2014-04-08 MED ORDER — SODIUM CHLORIDE 0.9 % IV SOLN
120.0000 mg/m2 | Freq: Once | INTRAVENOUS | Status: AC
  Administered 2014-04-08: 170 mg via INTRAVENOUS
  Filled 2014-04-08: qty 8.5

## 2014-04-08 MED ORDER — DEXAMETHASONE SODIUM PHOSPHATE 20 MG/5ML IJ SOLN
INTRAMUSCULAR | Status: AC
  Filled 2014-04-08: qty 5

## 2014-04-08 MED ORDER — SODIUM CHLORIDE 0.9 % IV SOLN
150.0000 mg | Freq: Once | INTRAVENOUS | Status: AC
  Administered 2014-04-08: 150 mg via INTRAVENOUS
  Filled 2014-04-08: qty 5

## 2014-04-08 MED ORDER — DEXAMETHASONE SODIUM PHOSPHATE 20 MG/5ML IJ SOLN
12.0000 mg | Freq: Once | INTRAMUSCULAR | Status: AC
  Administered 2014-04-08: 12 mg via INTRAVENOUS

## 2014-04-08 MED ORDER — PALONOSETRON HCL INJECTION 0.25 MG/5ML
INTRAVENOUS | Status: AC
  Filled 2014-04-08: qty 5

## 2014-04-08 MED ORDER — SODIUM CHLORIDE 0.9 % IV SOLN
Freq: Once | INTRAVENOUS | Status: AC
  Administered 2014-04-08: 10:00:00 via INTRAVENOUS

## 2014-04-08 MED ORDER — PALONOSETRON HCL INJECTION 0.25 MG/5ML
0.2500 mg | Freq: Once | INTRAVENOUS | Status: AC
  Administered 2014-04-08: 0.25 mg via INTRAVENOUS

## 2014-04-08 MED ORDER — POTASSIUM CHLORIDE 2 MEQ/ML IV SOLN
Freq: Once | INTRAVENOUS | Status: AC
  Administered 2014-04-08: 10:00:00 via INTRAVENOUS
  Filled 2014-04-08: qty 10

## 2014-04-08 MED ORDER — SODIUM CHLORIDE 0.9 % IV SOLN
60.0000 mg/m2 | Freq: Once | INTRAVENOUS | Status: AC
  Administered 2014-04-08: 87 mg via INTRAVENOUS
  Filled 2014-04-08: qty 87

## 2014-04-08 NOTE — Progress Notes (Addendum)
[facility] Telephone:(336) [phone]   Fax:(336) [phone]  SHARED VISIT PROGRESS NOTE  Pcp Not In System No address on file  DIAGNOSIS: Limited stage small cell lung cancer diagnosed in March of 2015.  PRIOR THERAPY: Single fraction of radiotherapy to the mediastinum under the care of Dr. Pablo Ledger on 03/13/2014.  CURRENT THERAPY: Systemic chemotherapy with cisplatin 60 mg/M2 day 1 and etoposide 120 mg/M2 on days 1, 2 and 3 with Neulasta support on day 4. First dose on 03/18/2014. Status post 1 cycle.   INTERVAL HISTORY: Brittany [name] 53 y.o. female returns to the clinic today for followup visit. She tolerated her first cycle of chemotherapy relatively well. She did have an occasional nonproductive cough but denied any issues with fever, chills, nausea, vomiting, diarrhea or constipation. Her appetite is good and she has gained some weight. Overall her energy level remains good although she does have some fatigue after chemotherapy. She presents today to proceed with cycle #2 of her systemic chemotherapy with cisplatin and etoposide with Neulasta support.  The patient is feeling fine today with no specific complaints. She denied having any significant chest pain, shortness of breath, or hemoptysis.   MEDICAL HISTORY: Past Medical History  Diagnosis Date  . Hypertension   . Allergy   . Asthma   . Anxiety   . Small cell lung cancer 03/16/2014    ALLERGIES:  is allergic to codeine; iodinated diagnostic agents; and sulfa antibiotics.  MEDICATIONS:  Current Outpatient Prescriptions  Medication Sig Dispense Refill  . albuterol (PROVENTIL HFA) 108 (90 BASE) MCG/ACT inhaler Inhale 2 puffs into the lungs every 4 (four) hours as needed for wheezing or shortness of breath.  6.7 each  5  . albuterol (PROVENTIL) (2.5 MG/3ML) 0.083% nebulizer solution Take 3 mLs (2.5 mg total) by nebulization every 6 (six) hours as needed for wheezing.  150 mL  1  . atorvastatin  (LIPITOR) 20 MG tablet Take 1 tablet (20 mg total) by mouth daily.  90 tablet  0  . budesonide-formoterol (SYMBICORT) 160-4.5 MCG/ACT inhaler Inhale 2 puffs into the lungs 2 (two) times daily.      . chlorpheniramine-HYDROcodone (TUSSIONEX PENNKINETIC ER) 10-8 MG/5ML LQCR Take 5 mLs by mouth every 12 (twelve) hours as needed.  60 mL  0  . diltiazem (CARDIZEM CD) 120 MG 24 hr capsule Take 1 capsule (120 mg total) by mouth daily.  30 capsule  0  . feeding supplement, RESOURCE BREEZE, (RESOURCE BREEZE) LIQD Take 1 Container by mouth 2 (two) times daily between meals.  30 Container  0  . fluticasone (FLONASE) 50 MCG/ACT nasal spray Place 2 sprays into both nostrils daily.      Marland Kitchen lidocaine-prilocaine (EMLA) cream Apply 1 application topically as needed. Apply to port 1 hr before chemo  30 g  0  . lisinopril (PRINIVIL,ZESTRIL) 10 MG tablet Take 10 mg by mouth daily.      . metoprolol succinate (TOPROL-XL) 50 MG 24 hr tablet Take 1 tablet (50 mg total) by mouth daily. Take with or immediately following a meal.  30 tablet  3  . prochlorperazine (COMPAZINE) 10 MG tablet Take 1 tablet (10 mg total) by mouth every 6 (six) hours as needed for nausea or vomiting.  60 tablet  0  . Wound Cleansers (RADIAPLEX EX) Apply topically.       No current facility-administered medications for this visit.    SURGICAL HISTORY:  Past Surgical History  Procedure Laterality Date  .  Tubal ligation    . Cesarean section    . Video bronchoscopy with endobronchial ultrasound N/A 03/11/2014    Procedure: VIDEO BRONCHOSCOPY WITH ENDOBRONCHIAL ULTRASOUND;  Surgeon: Melrose Nakayama, MD;  Location: [facility];  Service: Thoracic;  Laterality: N/A;  . Mediastinoscopy N/A 03/11/2014    Procedure: MEDIASTINOSCOPY;  Surgeon: Melrose Nakayama, MD;  Location: [facility];  Service: Thoracic;  Laterality: N/A;    REVIEW OF SYSTEMS:  Constitutional: positive for fatigue Eyes: negative Ears, nose, mouth, throat, and face:  negative Respiratory: positive for cough Cardiovascular: negative Gastrointestinal: negative Genitourinary:negative Integument/breast: negative Hematologic/lymphatic: negative Musculoskeletal:negative Neurological: negative Behavioral/Psych: negative Endocrine: negative Allergic/Immunologic: negative   PHYSICAL EXAMINATION: General appearance: alert, cooperative and no distress Head: Normocephalic, without obvious abnormality, atraumatic Neck: no adenopathy, no JVD, supple, symmetrical, trachea midline and thyroid not enlarged, symmetric, no tenderness/mass/nodules Lymph nodes: Cervical, supraclavicular, and axillary nodes normal. Resp: wheezes bilaterally Back: symmetric, no curvature. ROM normal. No CVA tenderness. Cardio: regular rate and rhythm, S1, S2 normal, no murmur, click, rub or gallop GI: soft, non-tender; bowel sounds normal; no masses,  no organomegaly Extremities: extremities normal, atraumatic, no cyanosis or edema Neurologic: Alert and oriented X 3, normal strength and tone. Normal symmetric reflexes. Normal coordination and gait  ECOG PERFORMANCE STATUS: 1 - Symptomatic but completely ambulatory  There were no vitals taken for this visit.  LABORATORY DATA: Lab Results  Component Value Date   WBC 15.0* 04/08/2014   HGB 15.7 04/08/2014   HCT 48.1* 04/08/2014   MCV 98.7 04/08/2014   PLT 237 04/08/2014      Chemistry      Component Value Date/Time   NA 141 04/08/2014 0833   NA 138 03/12/2014 0530   K 3.5 04/08/2014 0833   K 4.3 03/12/2014 0530   CL 99 03/12/2014 0530   CO2 21* 04/08/2014 0833   CO2 23 03/12/2014 0530   BUN 13.1 04/08/2014 0833   BUN 12 03/12/2014 0530   CREATININE 0.7 04/08/2014 0833   CREATININE 0.67 03/12/2014 0530   CREATININE 0.56 01/01/2014 1111      Component Value Date/Time   CALCIUM 9.9 04/08/2014 0833   CALCIUM 9.1 03/12/2014 0530   ALKPHOS 145 04/08/2014 0833   ALKPHOS 110 03/11/2014 1002   AST 19 04/08/2014 0833   AST 31 03/11/2014 1002   ALT 29  04/08/2014 0833   ALT 26 03/11/2014 1002   BILITOT 0.57 04/08/2014 0833   BILITOT 0.6 03/11/2014 1002       RADIOGRAPHIC STUDIES: Ct Abdomen Pelvis Wo Contrast  03/13/2014   CLINICAL DATA:  Newly diagnosed lung cancer, evaluate for metastases  EXAM: CT ABDOMEN AND PELVIS WITHOUT CONTRAST  TECHNIQUE: Multidetector CT imaging of the abdomen and pelvis was performed following the standard protocol without intravenous contrast.  COMPARISON:  None.  FINDINGS: Lung bases are essentially clear.  Unenhanced liver, spleen, pancreas, and adrenal glands are within normal limits.  Gallbladder is underdistended. No intrahepatic or extrahepatic ductal dilatation.  Kidneys are unremarkable.  No renal calculi or hydronephrosis.  No evidence of bowel obstruction.  Normal appendix.  Atherosclerotic calcifications of the abdominal aorta and branch vessels.  No abdominopelvic ascites.  No suspicious abdominopelvic lymphadenopathy.  Status post hysterectomy.  No adnexal masses.  Bladder is within normal limits.  Visualized osseous structures are within normal limits.  IMPRESSION: No evidence of metastatic disease in the abdomen/pelvis.   Electronically Signed   By: Julian Hy M.D.   On: 03/13/2014 11:47   Chest  2 View  03/11/2014   CLINICAL DATA:  Known hilar and mediastinal mass  EXAM: CHEST  2 VIEW  COMPARISON:  03/05/2014  FINDINGS: Cardiac shadow is stable. The lungs are well aerated bilaterally. The left lung is clear. The known right hilar/ mediastinal mass is again identified. No definitive infiltrate is identified.  IMPRESSION: Stable right hilar/ mediastinal mass.   Electronically Signed   By: Inez Catalina M.D.   On: 03/11/2014 11:52   Dg Chest 2 View  03/05/2014   CLINICAL DATA:  Shortness of breath, chest pain  EXAM: CHEST  2 VIEW  COMPARISON:  March 04, 2014  FINDINGS: The previously noted right perihilar mass with associated postobstructive bronchopneumonia is unchanged. There is no pleural effusion. There is  no pulmonary edema. The mediastinal contour and cardiac silhouette are stable. The soft tissues and osseous structures are normal.  IMPRESSION: Previously noted right perihilar mass with associated postobstructive bronchopneumonia is unchanged.   Electronically Signed   By: Abelardo Diesel M.D.   On: 03/05/2014 12:18   Dg Chest 2 View  03/04/2014   CLINICAL DATA:  Shortness of breath, history of smoking  EXAM: CHEST  2 VIEW  COMPARISON:  DG CHEST 2V dated 02/24/2014; DG CHEST 2 VIEW dated 11/22/2011; DG CHEST 2 VIEW dated 05/25/2008  FINDINGS: Grossly unchanged cardiac silhouette and mediastinal contours with similar appearance of previously identified large right-sided perihilar mass with associated heterogeneous opacities radiating peripherally along the right major and minor fissures. No new focal airspace opacities. No pleural effusion or pneumothorax. No evidence of edema.  IMPRESSION: Grossly unchanged appearance of previously identified large right-sided perihilar mass with associated postobstructive infection. These findings are again worrisome for bronchogenic carcinoma and further evaluation with chest CT is again recommended.  These results will be called to the ordering clinician or representative by the Radiologist Assistant, and communication documented in the PACS Dashboard.   Electronically Signed   By: Sandi Mariscal M.D.   On: 03/04/2014 15:15   Ct Angio Chest Pe W/cm &/or Wo Cm  03/05/2014   CLINICAL DATA:  Shortness of breath.  EXAM: CT ANGIOGRAPHY CHEST WITH CONTRAST  TECHNIQUE: Multidetector CT imaging of the chest was performed using the standard protocol during bolus administration of intravenous contrast. Multiplanar CT image reconstructions and MIPs were obtained to evaluate the vascular anatomy.  CONTRAST:  27mL OMNIPAQUE IOHEXOL 350 MG/ML SOLN  COMPARISON:  DG CHEST 2 VIEW dated 03/05/2014; DG CHEST 2V dated 03/04/2014; DG CHEST 2V dated 02/24/2014  FINDINGS: Thoracic aorta unremarkable. No  evidence of pulmonary embolus. There is narrowing of the right pulmonary artery secondary to large mediastinal and hilar mass. Cardiomegaly. Small pericardial effusion.  Extensive mediastinal and hilar adenopathy and/or mass lesion is present. This results in narrowing of the right pulmonary artery and near complete occlusion of the right bronchus. The mass extends into the subcarinal region and into the left hilum. Associated left hilar adenopathy is most likely present. The mass conglomerate measures up to 8.1 x 6.5 x 7.3 cm. Visualized portions of the thoracic esophagus unremarkable.  Trachea and left mainstem bronchus are patent. Again there is near-complete occlusion of the right mainstem bronchus. Infiltrative changes are present in the right middle and right lower lobes. These changes are most likely related to postobstructive pneumonitis. No pneumothorax. No significant pleural effusion.  Adrenals normal.  Visualized upper abdominal viscera unremarkable.  Punctate lucency in the right thyroid lobe, most likely tiny cysts. Shotty bilateral axillary lymph nodes. Breast tissue is  dense with asymmetric densities for which further evaluation with mammography suggested. No focal bony abnormalities identified.  Review of the MIP images confirms the above findings.  IMPRESSION: 1. Large mediastinal and right hilar mass lesions with narrowing of the right pulmonary artery and near complete occlusion of the right mainstem bronchus. These changes are most likely malignant and related to a process such as primary lung cancer with metastatic lymphadenopathy, metastatic adenopathy from other primary, or lymphoma. PET-CT can be obtained for further evaluation. 2. No evidence of pulmonary embolus. Again the right pulmonary artery is narrowed secondary to the above described mass lesion. 3. Postobstructive right middle lobe and right lower lobe pneumonia. Again the right mainstem bronchus is almost completely occluded by the  above-described mass. 4. Breasts are bilaterally dense with asymmetric densities, further evaluation with mammography suggested. 5. Cardiomegaly with small pericardial effusion.   Electronically Signed   By: Marcello Moores  Register   On: 03/05/2014 15:53   Mr Jeri Cos Wo Contrast  03/13/2014   CLINICAL DATA:  New diagnosis lung cancer.  Staging.  EXAM: MRI HEAD WITHOUT AND WITH CONTRAST  TECHNIQUE: Multiplanar, multiecho pulse sequences of the brain and surrounding structures were obtained without and with intravenous contrast.  CONTRAST:  37mL MULTIHANCE GADOBENATE DIMEGLUMINE 529 MG/ML IV SOLN  COMPARISON:  Head CT 03/23/2004  FINDINGS: The brain has a normal appearance on all pulse sequences without evidence of malformation, atrophy, old or acute infarction, mass lesion, hemorrhage, hydrocephalus or extra-axial collection. No pituitary mass. No fluid in the sinuses, middle ears or mastoids. No skull or skullbase lesion. There is flow in the major vessels at the base of the brain. Major venous sinuses show flow. After contrast administration, no abnormal enhancement occurs.  IMPRESSION: Normal examination.  No metastatic disease.   Electronically Signed   By: Nelson Chimes M.D.   On: 03/13/2014 12:24   Nm Pet Image Initial (pi) Skull Base To Thigh  03/25/2014   CLINICAL DATA:  Initial treatment strategy for lung carcinoma. High-grade poorly differentiated neuroendocrine carcinoma, small cell carcinoma.  EXAM: NUCLEAR MEDICINE PET SKULL BASE TO THIGH  TECHNIQUE: 6.2 mCi F-18 FDG was injected intravenously. Full-ring PET imaging was performed from the skull base to thigh after the radiotracer. CT data was obtained and used for attenuation correction and anatomic localization.  FASTING BLOOD GLUCOSE:  Value: 133 mg/dl  COMPARISON:  CT ABD/PELV WO CM dated 03/13/2014; DG CHEST 2V dated 02/24/2014; DG CHEST 2 VIEW dated 11/22/2011; DG CHEST 2V dated 03/04/2014; CT ANGIO CHEST W/CM &/OR WO/CM dated 03/05/2014  FINDINGS: NECK  No  hypermetabolic lymph nodes in the neck.  CHEST  Large intensely hypermetabolic right hilar mass surrounding the right mainstem bronchus and extending into the mediastinum measures approximately 6.4 x 6.0 cm and has intense metabolic activity with SUV max equals 16.2. The is a separate distinct nodule within the superior segment of the right lower lobe measuring 16 x 19 mm with associated metabolic activity (SUV max 5.1)  ABDOMEN/PELVIS  No abnormal hypermetabolic activity within the liver, pancreas, adrenal glands, or spleen. No hypermetabolic lymph nodes in the abdomen or pelvis.  SKELETON  There is diffuse increase in marrow metabolic activity. There are several segments of thoracic spine with relatively low metabolic activity consistent with marrow suppression from radiation port and  IMPRESSION: 1. Hypermetabolic right hilar and mediastinal mass consistent with small cell lung cancer metastasis. 2. Hypermetabolic right lower lobe pulmonary nodule likely represents primary lesion. 3. No evidence of distant metastasis.  4. Diffuse increased marrow activity relates to Neulasta support.   Electronically Signed   By: Suzy Bouchard M.D.   On: 03/25/2014 14:34   Dg Chest Port 1 View  03/11/2014   CLINICAL DATA:  Status post bronchoscopy  EXAM: PORTABLE CHEST - 1 VIEW  COMPARISON:  Film from earlier in the same day.  FINDINGS: Prominent right perihilar density is noted consistent with the known history of mediastinal mass. No pneumothorax or pleural effusion is seen. No focal infiltrate is noted.  IMPRESSION: No evidence of pneumothorax following bronchoscopy.   Electronically Signed   By: Inez Catalina M.D.   On: 03/11/2014 16:43   ASSESSMENT AND PLAN: This is a very pleasant 53 years old [place] American female recently diagnosed with limited stage small cell lung cancer. She is currently receiving  radiotherapy to the mediastinum under the care of Dr. Pablo Ledger. She status post 1 cycle of systemic chemotherapy  with cisplatin at 60 mg per meter square given on day 1 and etoposide at 120 mg per meter square given on days 1, 2 and 3 with Neulasta support given on day 4. Patient was discussed with and also seen by Dr. Julien Nordmann. Her laboratory data was reviewed. She will continue with cycle #2 of her chemotherapy with no dose modifications today as scheduled. She will continue weekly labs as scheduled. She'll followup with Dr. Julien Nordmann in 3 weeks with a restaging CT scan of her chest with contrast to reevaluate her disease.   She was advised to call immediately if she has any concerning symptoms in the interval. The patient voices understanding of current disease status and treatment options and is in agreement with the current care plan.  All questions were answered. The patient knows to call the clinic with any problems, questions or concerns. We can certainly see the patient much sooner if necessary.  Carlton Adam, PA-C  ADDENDUM: Hematology/Oncology Attending: I had a face to face encounter with the patient. I recommended her care plan.  This is avery pleasant 53 years old [place] American female with recently diagnosed limited stage small cell lung cancer currently undergoing systemic chemotherapy with cisplatin and etoposide status post 1 cycle. She tolerated the first cycle of her treatment fairly well with no significant adverse effects. The patient noted significant improvement in her breathing after completing the first cycle of the chemotherapy. I recommended for her to proceed with cycle #2 today as scheduled. This would be concurrent with radiotherapy under the care of Dr. Pablo Ledger. She would come back for follow up visit in 3 weeks after repeating CT scan of the chest for restaging of her disease. She was advised to call immediately if she has any concerning symptoms in the interval.  Disclaimer: This note was dictated with voice recognition software. Similar sounding words can inadvertently be  transcribed and may not be corrected upon review. Curt Bears, MD 04/09/2014

## 2014-04-08 NOTE — Progress Notes (Signed)
Labs within tx parameters. Patient still refusing PAC at this time. Patient tolerated treatment well.

## 2014-04-08 NOTE — Patient Instructions (Signed)
[facility] Discharge Instructions for Patients Receiving Chemotherapy  Today you received the following chemotherapy agents: Etoposide, Cisplatin  To help prevent nausea and vomiting after your treatment, we encourage you to take your nausea medication: Compazine 10 mg every 6 hrs as needed.    If you develop nausea and vomiting that is not controlled by your nausea medication, call the clinic.   BELOW ARE SYMPTOMS THAT SHOULD BE REPORTED IMMEDIATELY:  *FEVER GREATER THAN 100.5 F  *CHILLS WITH OR WITHOUT FEVER  NAUSEA AND VOMITING THAT IS NOT CONTROLLED WITH YOUR NAUSEA MEDICATION  *UNUSUAL SHORTNESS OF BREATH  *UNUSUAL BRUISING OR BLEEDING  TENDERNESS IN MOUTH AND THROAT WITH OR WITHOUT PRESENCE OF ULCERS  *URINARY PROBLEMS  *BOWEL PROBLEMS  UNUSUAL RASH Items with * indicate a potential emergency and should be followed up as soon as possible.  Feel free to call the clinic you have any questions or concerns. The clinic phone number is [phone].

## 2014-04-08 NOTE — Progress Notes (Signed)
Nutrition followup completed with patient in chemotherapy room.  Patient reports she feels better.  Her appetite has increased and her weight has increased to 118 pounds in April 28, from 111 pounds April 6.  Patient denies nausea and vomiting.  No diarrhea, or constipation.  Patient is consuming boost breeze twice a day without difficulty.  Nutrition diagnosis: Unintended weight loss improved.  Intervention: Patient was encouraged to continue small frequent meals with higher protein foods.  Encouraged patient to continue oral nutrition supplements twice a day.  Questions were answered.  Teach back method used.  Monitoring, evaluation, goals: Patient has tolerated increased calories and protein to minimize further weight loss.  Next visit: Wednesday, May 20, during chemotherapy.

## 2014-04-09 ENCOUNTER — Encounter: Payer: Self-pay | Admitting: Physician Assistant

## 2014-04-09 ENCOUNTER — Telehealth: Payer: Self-pay | Admitting: Internal Medicine

## 2014-04-09 ENCOUNTER — Ambulatory Visit (HOSPITAL_BASED_OUTPATIENT_CLINIC_OR_DEPARTMENT_OTHER): Payer: BC Managed Care – PPO

## 2014-04-09 ENCOUNTER — Ambulatory Visit
Admission: RE | Admit: 2014-04-09 | Discharge: 2014-04-09 | Disposition: A | Discharge status: Home / Self Care | Payer: BC Managed Care – PPO | Source: Ambulatory Visit | Attending: Radiation Oncology | Admitting: Radiation Oncology

## 2014-04-09 VITALS — BP 139/84 | HR 85 | Temp 97.9°F | Resp 19

## 2014-04-09 DIAGNOSIS — C3490 Malignant neoplasm of unspecified part of unspecified bronchus or lung: Secondary | ICD-10-CM

## 2014-04-09 DIAGNOSIS — Z5111 Encounter for antineoplastic chemotherapy: Secondary | ICD-10-CM

## 2014-04-09 DIAGNOSIS — C7A1 Malignant poorly differentiated neuroendocrine tumors: Secondary | ICD-10-CM

## 2014-04-09 MED ORDER — DEXAMETHASONE SODIUM PHOSPHATE 10 MG/ML IJ SOLN
10.0000 mg | Freq: Once | INTRAMUSCULAR | Status: AC
  Administered 2014-04-09: 10 mg via INTRAVENOUS

## 2014-04-09 MED ORDER — SODIUM CHLORIDE 0.9 % IV SOLN
Freq: Once | INTRAVENOUS | Status: AC
  Administered 2014-04-09: 14:00:00 via INTRAVENOUS

## 2014-04-09 MED ORDER — SODIUM CHLORIDE 0.9 % IV SOLN
120.0000 mg/m2 | Freq: Once | INTRAVENOUS | Status: AC
  Administered 2014-04-09: 170 mg via INTRAVENOUS
  Filled 2014-04-09: qty 8.5

## 2014-04-09 MED ORDER — DEXAMETHASONE SODIUM PHOSPHATE 10 MG/ML IJ SOLN
INTRAMUSCULAR | Status: AC
  Filled 2014-04-09: qty 1

## 2014-04-09 NOTE — Telephone Encounter (Signed)
Sent dr Julien Nordmann a staff message regarding this pt needing an appt.

## 2014-04-09 NOTE — Patient Instructions (Signed)
Continue weekly labs as scheduled Followup with Dr. Julien Nordmann in 3 weeks with a restaging CT scan of your chest to reevaluate your disease

## 2014-04-09 NOTE — Patient Instructions (Signed)
[name] Discharge Instructions for Patients Receiving Chemotherapy  Today you received the following chemotherapy agents etoposide  To help prevent nausea and vomiting after your treatment, we encourage you to take your nausea medication if needed.   If you develop nausea and vomiting that is not controlled by your nausea medication, call the clinic.   BELOW ARE SYMPTOMS THAT SHOULD BE REPORTED IMMEDIATELY:  *FEVER GREATER THAN 100.5 F  *CHILLS WITH OR WITHOUT FEVER  NAUSEA AND VOMITING THAT IS NOT CONTROLLED WITH YOUR NAUSEA MEDICATION  *UNUSUAL SHORTNESS OF BREATH  *UNUSUAL BRUISING OR BLEEDING  TENDERNESS IN MOUTH AND THROAT WITH OR WITHOUT PRESENCE OF ULCERS  *URINARY PROBLEMS  *BOWEL PROBLEMS  UNUSUAL RASH Items with * indicate a potential emergency and should be followed up as soon as possible.  Feel free to call the clinic you have any questions or concerns. The clinic phone number is [phone].

## 2014-04-10 ENCOUNTER — Telehealth: Payer: Self-pay | Admitting: Internal Medicine

## 2014-04-10 ENCOUNTER — Ambulatory Visit (HOSPITAL_BASED_OUTPATIENT_CLINIC_OR_DEPARTMENT_OTHER): Payer: BC Managed Care – PPO

## 2014-04-10 ENCOUNTER — Ambulatory Visit
Admission: RE | Admit: 2014-04-10 | Discharge: 2014-04-10 | Disposition: A | Discharge status: Home / Self Care | Payer: BC Managed Care – PPO | Source: Ambulatory Visit | Attending: Radiation Oncology | Admitting: Radiation Oncology

## 2014-04-10 VITALS — BP 155/78 | HR 74 | Temp 98.2°F | Resp 18

## 2014-04-10 DIAGNOSIS — C7A1 Malignant poorly differentiated neuroendocrine tumors: Secondary | ICD-10-CM

## 2014-04-10 DIAGNOSIS — Z5111 Encounter for antineoplastic chemotherapy: Secondary | ICD-10-CM

## 2014-04-10 DIAGNOSIS — C3490 Malignant neoplasm of unspecified part of unspecified bronchus or lung: Secondary | ICD-10-CM

## 2014-04-10 MED ORDER — DEXAMETHASONE SODIUM PHOSPHATE 10 MG/ML IJ SOLN
INTRAMUSCULAR | Status: AC
  Filled 2014-04-10: qty 1

## 2014-04-10 MED ORDER — DEXAMETHASONE SODIUM PHOSPHATE 10 MG/ML IJ SOLN
10.0000 mg | Freq: Once | INTRAMUSCULAR | Status: AC
  Administered 2014-04-10: 10 mg via INTRAVENOUS

## 2014-04-10 MED ORDER — ETOPOSIDE CHEMO INJECTION 1 GM/50ML
120.0000 mg/m2 | Freq: Once | INTRAVENOUS | Status: AC
  Administered 2014-04-10: 170 mg via INTRAVENOUS
  Filled 2014-04-10: qty 8.5

## 2014-04-10 MED ORDER — SODIUM CHLORIDE 0.9 % IV SOLN
Freq: Once | INTRAVENOUS | Status: AC
  Administered 2014-04-10: 14:00:00 via INTRAVENOUS

## 2014-04-10 NOTE — Telephone Encounter (Signed)
lmonvm advising the pt to pick up an appt calendar for may.

## 2014-04-10 NOTE — Patient Instructions (Signed)
[facility] Discharge Instructions for Patients Receiving Chemotherapy  Today you received the following chemotherapy agents: Etoposide  To help prevent nausea and vomiting after your treatment, we encourage you to take your nausea medication: Compazine 10 mg every 6 hrs as needed.    If you develop nausea and vomiting that is not controlled by your nausea medication, call the clinic.   BELOW ARE SYMPTOMS THAT SHOULD BE REPORTED IMMEDIATELY:  *FEVER GREATER THAN 100.5 F  *CHILLS WITH OR WITHOUT FEVER  NAUSEA AND VOMITING THAT IS NOT CONTROLLED WITH YOUR NAUSEA MEDICATION  *UNUSUAL SHORTNESS OF BREATH  *UNUSUAL BRUISING OR BLEEDING  TENDERNESS IN MOUTH AND THROAT WITH OR WITHOUT PRESENCE OF ULCERS  *URINARY PROBLEMS  *BOWEL PROBLEMS  UNUSUAL RASH Items with * indicate a potential emergency and should be followed up as soon as possible.  Feel free to call the clinic you have any questions or concerns. The clinic phone number is [phone].

## 2014-04-11 ENCOUNTER — Ambulatory Visit (HOSPITAL_BASED_OUTPATIENT_CLINIC_OR_DEPARTMENT_OTHER): Payer: BC Managed Care – PPO

## 2014-04-11 VITALS — BP 170/91 | HR 64 | Temp 97.7°F

## 2014-04-11 DIAGNOSIS — C7A1 Malignant poorly differentiated neuroendocrine tumors: Secondary | ICD-10-CM

## 2014-04-11 DIAGNOSIS — Z5189 Encounter for other specified aftercare: Secondary | ICD-10-CM

## 2014-04-11 DIAGNOSIS — C3490 Malignant neoplasm of unspecified part of unspecified bronchus or lung: Secondary | ICD-10-CM

## 2014-04-11 MED ORDER — PEGFILGRASTIM INJECTION 6 MG/0.6ML
6.0000 mg | Freq: Once | SUBCUTANEOUS | Status: AC
  Administered 2014-04-11: 6 mg via SUBCUTANEOUS

## 2014-04-11 NOTE — Patient Instructions (Signed)

## 2014-04-13 ENCOUNTER — Ambulatory Visit
Admission: RE | Admit: 2014-04-13 | Discharge: 2014-04-13 | Disposition: A | Discharge status: Home / Self Care | Payer: BC Managed Care – PPO | Source: Ambulatory Visit | Attending: Radiation Oncology | Admitting: Radiation Oncology

## 2014-04-14 ENCOUNTER — Ambulatory Visit
Admission: RE | Admit: 2014-04-14 | Discharge: 2014-04-14 | Disposition: A | Discharge status: Home / Self Care | Payer: BC Managed Care – PPO | Source: Ambulatory Visit | Attending: Radiation Oncology | Admitting: Radiation Oncology

## 2014-04-14 VITALS — BP 115/75 | HR 86 | Temp 98.7°F | Wt 117.7 lb

## 2014-04-14 DIAGNOSIS — C3490 Malignant neoplasm of unspecified part of unspecified bronchus or lung: Secondary | ICD-10-CM

## 2014-04-14 NOTE — Progress Notes (Signed)
Weekly Management Note Current Dose:17 Gy  Projected Dose:57 Gy   Narrative:  The patient presents for routine under treatment assessment.  CBCT/MVCT images/Port film x-rays were reviewed.  The chart was checked. Doing well. No complaints. No dysphagia. Second round of chemo  Physical Findings:  Unchanged  Vitals:  Filed Vitals:   04/14/14 1625  BP: 115/75  Pulse: 86  Temp: 98.7 F (37.1 C)   Weight:  Wt Readings from Last 3 Encounters:  04/14/14 117 lb 11.2 oz (53.388 kg)  04/07/14 118 lb (53.524 kg)  03/27/14 114 lb 12.8 oz (52.073 kg)   Lab Results  Component Value Date   WBC 15.0* 04/08/2014   HGB 15.7 04/08/2014   HCT 48.1* 04/08/2014   MCV 98.7 04/08/2014   PLT 237 04/08/2014   Lab Results  Component Value Date   CREATININE 0.7 04/08/2014   BUN 13.1 04/08/2014   NA 141 04/08/2014   K 3.5 04/08/2014   CL 99 03/12/2014   CO2 21* 04/08/2014     Impression:  The patient is tolerating radiation.  Plan:  Continue treatment as planned.   [place]

## 2014-04-14 NOTE — Progress Notes (Signed)
Weekly assessment  Of radiation to right lung.Denies pain,sob, cough or fatigue.Has competed second round of chemotherapy and is doing well.No skin changes.

## 2014-04-15 ENCOUNTER — Telehealth: Payer: Self-pay | Admitting: Internal Medicine

## 2014-04-15 ENCOUNTER — Ambulatory Visit
Admission: RE | Admit: 2014-04-15 | Discharge: 2014-04-15 | Disposition: A | Discharge status: Home / Self Care | Payer: BC Managed Care – PPO | Source: Ambulatory Visit | Attending: Radiation Oncology | Admitting: Radiation Oncology

## 2014-04-15 ENCOUNTER — Other Ambulatory Visit: Payer: BC Managed Care – PPO

## 2014-04-15 ENCOUNTER — Other Ambulatory Visit (HOSPITAL_BASED_OUTPATIENT_CLINIC_OR_DEPARTMENT_OTHER): Payer: BC Managed Care – PPO

## 2014-04-15 DIAGNOSIS — C3490 Malignant neoplasm of unspecified part of unspecified bronchus or lung: Secondary | ICD-10-CM

## 2014-04-15 DIAGNOSIS — C7A1 Malignant poorly differentiated neuroendocrine tumors: Secondary | ICD-10-CM

## 2014-04-15 LAB — COMPREHENSIVE METABOLIC PANEL (CC13)
ALK PHOS: 180 U/L — AB (ref 40–150)
ALT: 15 U/L (ref 0–55)
ANION GAP: 10 meq/L (ref 3–11)
AST: 11 U/L (ref 5–34)
Albumin: 3.4 g/dL — ABNORMAL LOW (ref 3.5–5.0)
BUN: 10.1 mg/dL (ref 7.0–26.0)
CO2: 25 meq/L (ref 22–29)
CREATININE: 0.6 mg/dL (ref 0.6–1.1)
Calcium: 10.1 mg/dL (ref 8.4–10.4)
Chloride: 103 mEq/L (ref 98–109)
Glucose: 123 mg/dl (ref 70–140)
Potassium: 3.5 mEq/L (ref 3.5–5.1)
Sodium: 138 mEq/L (ref 136–145)
Total Bilirubin: 0.65 mg/dL (ref 0.20–1.20)
Total Protein: 6.3 g/dL — ABNORMAL LOW (ref 6.4–8.3)

## 2014-04-15 LAB — CBC WITH DIFFERENTIAL/PLATELET
BASO%: 0.4 % (ref 0.0–2.0)
Basophils Absolute: 0.1 10*3/uL (ref 0.0–0.1)
EOS%: 0.6 % (ref 0.0–7.0)
Eosinophils Absolute: 0.1 10*3/uL (ref 0.0–0.5)
HCT: 38.7 % (ref 34.8–46.6)
HGB: 13.5 g/dL (ref 11.6–15.9)
LYMPH%: 6.3 % — AB (ref 14.0–49.7)
MCH: 32.7 pg (ref 25.1–34.0)
MCHC: 34.9 g/dL (ref 31.5–36.0)
MCV: 93.7 fL (ref 79.5–101.0)
MONO#: 2.3 10*3/uL — AB (ref 0.1–0.9)
MONO%: 11.8 % (ref 0.0–14.0)
NEUT#: 15.8 10*3/uL — ABNORMAL HIGH (ref 1.5–6.5)
NEUT%: 80.9 % — ABNORMAL HIGH (ref 38.4–76.8)
PLATELETS: 166 10*3/uL (ref 145–400)
RBC: 4.13 10*6/uL (ref 3.70–5.45)
RDW: 20.9 % — ABNORMAL HIGH (ref 11.2–14.5)
WBC: 19.6 10*3/uL — ABNORMAL HIGH (ref 3.9–10.3)
lymph#: 1.2 10*3/uL (ref 0.9–3.3)

## 2014-04-15 LAB — MAGNESIUM (CC13): Magnesium: 1.7 mg/dl (ref 1.5–2.5)

## 2014-04-15 NOTE — Telephone Encounter (Signed)
pt called to r/s lab to later time b4 radiation...done...pt aware of new time

## 2014-04-16 ENCOUNTER — Ambulatory Visit
Admission: RE | Admit: 2014-04-16 | Discharge: 2014-04-16 | Disposition: A | Discharge status: Home / Self Care | Payer: BC Managed Care – PPO | Source: Ambulatory Visit | Attending: Radiation Oncology | Admitting: Radiation Oncology

## 2014-04-17 ENCOUNTER — Ambulatory Visit
Admission: RE | Admit: 2014-04-17 | Discharge: 2014-04-17 | Disposition: A | Discharge status: Home / Self Care | Payer: BC Managed Care – PPO | Source: Ambulatory Visit | Attending: Radiation Oncology | Admitting: Radiation Oncology

## 2014-04-20 ENCOUNTER — Ambulatory Visit
Admission: RE | Admit: 2014-04-20 | Discharge: 2014-04-20 | Disposition: A | Discharge status: Home / Self Care | Payer: BC Managed Care – PPO | Source: Ambulatory Visit | Attending: Radiation Oncology | Admitting: Radiation Oncology

## 2014-04-21 ENCOUNTER — Ambulatory Visit
Admission: RE | Admit: 2014-04-21 | Discharge: 2014-04-21 | Disposition: A | Discharge status: Home / Self Care | Payer: BC Managed Care – PPO | Source: Ambulatory Visit | Attending: Radiation Oncology | Admitting: Radiation Oncology

## 2014-04-21 VITALS — BP 110/72 | HR 87 | Temp 98.4°F | Wt 116.5 lb

## 2014-04-21 DIAGNOSIS — C3490 Malignant neoplasm of unspecified part of unspecified bronchus or lung: Secondary | ICD-10-CM

## 2014-04-21 MED ORDER — MAGIC MOUTHWASH W/LIDOCAINE: 5.0000 mL | Freq: Three times a day (TID) | ORAL | Status: DC | PRN

## 2014-04-21 MED ORDER — HYDROCODONE-ACETAMINOPHEN 7.5-325 MG/15ML PO SOLN: 10.0000 mL | Freq: Four times a day (QID) | ORAL | Status: DC | PRN

## 2014-04-21 MED ORDER — SUCRALFATE 1 G PO TABS: 1.0000 g | ORAL_TABLET | Freq: Three times a day (TID) | ORAL | Status: DC

## 2014-04-21 NOTE — Progress Notes (Signed)
Weekly Management Note Current Dose:22 Gy  Projected Dose:52 Gy   Narrative:  The patient presents for routine under treatment assessment.  CBCT/MVCT images/Port film x-rays were reviewed.  The chart was checked. Some mild dysphagia. Eatin gwell and taking boost breeze. Some skin changes on back. Sister applies radiaplex..   Physical Findings:  Mild pink on back.   Vitals:  Filed Vitals:   04/21/14 1618  BP: 110/72  Pulse: 87  Temp: 98.4 F (36.9 C)   Weight:  Wt Readings from Last 3 Encounters:  04/21/14 116 lb 8 oz (52.844 kg)  04/14/14 117 lb 11.2 oz (53.388 kg)  04/07/14 118 lb (53.524 kg)   Lab Results  Component Value Date   WBC 19.6* 04/15/2014   HGB 13.5 04/15/2014   HCT 38.7 04/15/2014   MCV 93.7 04/15/2014   PLT 166 04/15/2014   Lab Results  Component Value Date   CREATININE 0.6 04/15/2014   BUN 10.1 04/15/2014   NA 138 04/15/2014   K 3.5 04/15/2014   CL 99 03/12/2014   CO2 25 04/15/2014     Impression:  The patient is tolerating radiation.  Plan:  Continue treatment as planned. Continue RT. Discussed medical treatment of dysphagia including carafate, mmw with lidocaine and hycet. discussed necessity of nutrition.

## 2014-04-21 NOTE — Progress Notes (Signed)
Patient for weekly assessment of radiation treatment to chest.starting to have some fatigue and burning sensation of throat and esophagus diminishing wanting to eat.Mild redness of skin mid posterior back.Told it is ok to start applying radiaplex at least twice daily.

## 2014-04-22 ENCOUNTER — Ambulatory Visit (HOSPITAL_COMMUNITY)
Admission: RE | Admit: 2014-04-22 | Discharge: 2014-04-22 | Disposition: A | Discharge status: Home / Self Care | Payer: BC Managed Care – PPO | Source: Ambulatory Visit | Attending: Physician Assistant | Admitting: Physician Assistant

## 2014-04-22 ENCOUNTER — Other Ambulatory Visit (HOSPITAL_BASED_OUTPATIENT_CLINIC_OR_DEPARTMENT_OTHER): Payer: BC Managed Care – PPO

## 2014-04-22 ENCOUNTER — Other Ambulatory Visit: Payer: Self-pay | Admitting: Physician Assistant

## 2014-04-22 ENCOUNTER — Ambulatory Visit
Admission: RE | Admit: 2014-04-22 | Discharge: 2014-04-22 | Disposition: A | Discharge status: Home / Self Care | Payer: BC Managed Care – PPO | Source: Ambulatory Visit | Attending: Radiation Oncology | Admitting: Radiation Oncology

## 2014-04-22 DIAGNOSIS — J9819 Other pulmonary collapse: Secondary | ICD-10-CM | POA: Insufficient documentation

## 2014-04-22 DIAGNOSIS — C3490 Malignant neoplasm of unspecified part of unspecified bronchus or lung: Secondary | ICD-10-CM

## 2014-04-22 DIAGNOSIS — R222 Localized swelling, mass and lump, trunk: Secondary | ICD-10-CM | POA: Insufficient documentation

## 2014-04-22 DIAGNOSIS — C7A1 Malignant poorly differentiated neuroendocrine tumors: Secondary | ICD-10-CM

## 2014-04-22 DIAGNOSIS — Z79899 Other long term (current) drug therapy: Secondary | ICD-10-CM | POA: Insufficient documentation

## 2014-04-22 LAB — MAGNESIUM (CC13): MAGNESIUM: 2.1 mg/dL (ref 1.5–2.5)

## 2014-04-22 LAB — COMPREHENSIVE METABOLIC PANEL (CC13)
ALT: 18 U/L (ref 0–55)
AST: 14 U/L (ref 5–34)
Albumin: 3.7 g/dL (ref 3.5–5.0)
Alkaline Phosphatase: 151 U/L — ABNORMAL HIGH (ref 40–150)
Anion Gap: 13 mEq/L — ABNORMAL HIGH (ref 3–11)
BUN: 12.6 mg/dL (ref 7.0–26.0)
CO2: 26 mEq/L (ref 22–29)
Calcium: 10 mg/dL (ref 8.4–10.4)
Chloride: 103 mEq/L (ref 98–109)
Creatinine: 0.7 mg/dL (ref 0.6–1.1)
Glucose: 116 mg/dl (ref 70–140)
Potassium: 3.7 mEq/L (ref 3.5–5.1)
Sodium: 142 mEq/L (ref 136–145)
Total Bilirubin: 0.39 mg/dL (ref 0.20–1.20)
Total Protein: 7 g/dL (ref 6.4–8.3)

## 2014-04-22 LAB — CBC WITH DIFFERENTIAL/PLATELET
BASO%: 0.4 % (ref 0.0–2.0)
BASOS ABS: 0 10*3/uL (ref 0.0–0.1)
EOS ABS: 0.2 10*3/uL (ref 0.0–0.5)
EOS%: 2 % (ref 0.0–7.0)
HEMATOCRIT: 40.1 % (ref 34.8–46.6)
HEMOGLOBIN: 13 g/dL (ref 11.6–15.9)
LYMPH%: 11.9 % — ABNORMAL LOW (ref 14.0–49.7)
MCH: 32.3 pg (ref 25.1–34.0)
MCHC: 32.5 g/dL (ref 31.5–36.0)
MCV: 99.5 fL (ref 79.5–101.0)
MONO#: 0.9 10*3/uL (ref 0.1–0.9)
MONO%: 7.6 % (ref 0.0–14.0)
NEUT%: 78.1 % — ABNORMAL HIGH (ref 38.4–76.8)
NEUTROS ABS: 8.9 10*3/uL — AB (ref 1.5–6.5)
PLATELETS: 126 10*3/uL — AB (ref 145–400)
RBC: 4.03 10*6/uL (ref 3.70–5.45)
RDW: 24.6 % — ABNORMAL HIGH (ref 11.2–14.5)
WBC: 11.4 10*3/uL — ABNORMAL HIGH (ref 3.9–10.3)
lymph#: 1.4 10*3/uL (ref 0.9–3.3)

## 2014-04-23 ENCOUNTER — Ambulatory Visit
Admission: RE | Admit: 2014-04-23 | Discharge: 2014-04-23 | Disposition: A | Discharge status: Home / Self Care | Payer: BC Managed Care – PPO | Source: Ambulatory Visit | Attending: Radiation Oncology | Admitting: Radiation Oncology

## 2014-04-24 ENCOUNTER — Ambulatory Visit
Admission: RE | Admit: 2014-04-24 | Discharge: 2014-04-24 | Disposition: A | Discharge status: Home / Self Care | Payer: BC Managed Care – PPO | Source: Ambulatory Visit | Attending: Radiation Oncology | Admitting: Radiation Oncology

## 2014-04-27 ENCOUNTER — Ambulatory Visit
Admission: RE | Admit: 2014-04-27 | Discharge: 2014-04-27 | Disposition: A | Discharge status: Home / Self Care | Payer: BC Managed Care – PPO | Source: Ambulatory Visit | Attending: Radiation Oncology | Admitting: Radiation Oncology

## 2014-04-28 ENCOUNTER — Ambulatory Visit
Admission: RE | Admit: 2014-04-28 | Discharge: 2014-04-28 | Disposition: A | Discharge status: Home / Self Care | Payer: BC Managed Care – PPO | Source: Ambulatory Visit | Attending: Radiation Oncology | Admitting: Radiation Oncology

## 2014-04-28 VITALS — BP 115/76 | HR 83 | Temp 99.0°F | Wt 118.4 lb

## 2014-04-28 DIAGNOSIS — C3490 Malignant neoplasm of unspecified part of unspecified bronchus or lung: Secondary | ICD-10-CM

## 2014-04-28 NOTE — Progress Notes (Signed)
Weekly assessment of radiation to right lung.completed 16 of 26 treatments.Mild redness posteriorly.Denies pain, nausea or increased fatigue.

## 2014-04-28 NOTE — Progress Notes (Signed)
Weekly Management Note Current Dose: 32 Gy  Projected Dose:52 Gy   Narrative:  The patient presents for routine under treatment assessment.  CBCT/MVCT images/Port film x-rays were reviewed.  The chart was checked. Doing well.Odynophagia is better.  Physical Findings:  Unchanged. Some pink on her back.  Vitals:  Filed Vitals:   04/28/14 1458  BP: 115/76  Pulse: 83  Temp: 99 F (37.2 C)   Weight:  Wt Readings from Last 3 Encounters:  04/28/14 118 lb 6.4 oz (53.706 kg)  04/21/14 116 lb 8 oz (52.844 kg)  04/14/14 117 lb 11.2 oz (53.388 kg)   Lab Results  Component Value Date   WBC 11.4* 04/22/2014   HGB 13.0 04/22/2014   HCT 40.1 04/22/2014   MCV 99.5 04/22/2014   PLT 126* 04/22/2014   Lab Results  Component Value Date   CREATININE 0.7 04/22/2014   BUN 12.6 04/22/2014   NA 142 04/22/2014   K 3.7 04/22/2014   CL 99 03/12/2014   CO2 26 04/22/2014     Impression:  The patient is tolerating radiation.  Plan:  Continue treatment as planned.

## 2014-04-29 ENCOUNTER — Encounter: Payer: Self-pay | Admitting: Physician Assistant

## 2014-04-29 ENCOUNTER — Ambulatory Visit (HOSPITAL_BASED_OUTPATIENT_CLINIC_OR_DEPARTMENT_OTHER): Payer: BC Managed Care – PPO | Admitting: Physician Assistant

## 2014-04-29 ENCOUNTER — Other Ambulatory Visit (HOSPITAL_BASED_OUTPATIENT_CLINIC_OR_DEPARTMENT_OTHER): Payer: BC Managed Care – PPO

## 2014-04-29 ENCOUNTER — Ambulatory Visit
Admission: RE | Admit: 2014-04-29 | Discharge: 2014-04-29 | Disposition: A | Discharge status: Home / Self Care | Payer: BC Managed Care – PPO | Source: Ambulatory Visit | Attending: Radiation Oncology | Admitting: Radiation Oncology

## 2014-04-29 ENCOUNTER — Ambulatory Visit: Payer: BC Managed Care – PPO | Admitting: Nutrition

## 2014-04-29 ENCOUNTER — Telehealth: Payer: Self-pay | Admitting: Internal Medicine

## 2014-04-29 ENCOUNTER — Ambulatory Visit (HOSPITAL_BASED_OUTPATIENT_CLINIC_OR_DEPARTMENT_OTHER): Payer: BC Managed Care – PPO

## 2014-04-29 VITALS — BP 136/73 | HR 88 | Temp 98.6°F | Resp 18 | Ht 59.0 in | Wt 117.3 lb

## 2014-04-29 DIAGNOSIS — Z5111 Encounter for antineoplastic chemotherapy: Secondary | ICD-10-CM

## 2014-04-29 DIAGNOSIS — C3490 Malignant neoplasm of unspecified part of unspecified bronchus or lung: Secondary | ICD-10-CM

## 2014-04-29 DIAGNOSIS — C7A1 Malignant poorly differentiated neuroendocrine tumors: Secondary | ICD-10-CM

## 2014-04-29 LAB — CBC WITH DIFFERENTIAL/PLATELET
BASO%: 0.3 % (ref 0.0–2.0)
Basophils Absolute: 0 10*3/uL (ref 0.0–0.1)
EOS%: 0.8 % (ref 0.0–7.0)
Eosinophils Absolute: 0.1 10*3/uL (ref 0.0–0.5)
HEMATOCRIT: 39.9 % (ref 34.8–46.6)
HGB: 13.1 g/dL (ref 11.6–15.9)
LYMPH%: 5.8 % — ABNORMAL LOW (ref 14.0–49.7)
MCH: 33 pg (ref 25.1–34.0)
MCHC: 32.8 g/dL (ref 31.5–36.0)
MCV: 100.5 fL (ref 79.5–101.0)
MONO#: 0.9 10*3/uL (ref 0.1–0.9)
MONO%: 7.8 % (ref 0.0–14.0)
NEUT#: 9.4 10*3/uL — ABNORMAL HIGH (ref 1.5–6.5)
NEUT%: 85.3 % — AB (ref 38.4–76.8)
Platelets: 316 10*3/uL (ref 145–400)
RBC: 3.97 10*6/uL (ref 3.70–5.45)
RDW: 24.6 % — ABNORMAL HIGH (ref 11.2–14.5)
WBC: 11 10*3/uL — ABNORMAL HIGH (ref 3.9–10.3)
lymph#: 0.6 10*3/uL — ABNORMAL LOW (ref 0.9–3.3)

## 2014-04-29 LAB — COMPREHENSIVE METABOLIC PANEL (CC13)
ALBUMIN: 3.7 g/dL (ref 3.5–5.0)
ALT: 15 U/L (ref 0–55)
AST: 15 U/L (ref 5–34)
Alkaline Phosphatase: 110 U/L (ref 40–150)
Anion Gap: 14 mEq/L — ABNORMAL HIGH (ref 3–11)
BUN: 10 mg/dL (ref 7.0–26.0)
CO2: 23 mEq/L (ref 22–29)
Calcium: 9.4 mg/dL (ref 8.4–10.4)
Chloride: 106 mEq/L (ref 98–109)
Creatinine: 0.7 mg/dL (ref 0.6–1.1)
Glucose: 145 mg/dl — ABNORMAL HIGH (ref 70–140)
POTASSIUM: 3.7 meq/L (ref 3.5–5.1)
SODIUM: 143 meq/L (ref 136–145)
TOTAL PROTEIN: 6.7 g/dL (ref 6.4–8.3)
Total Bilirubin: 0.37 mg/dL (ref 0.20–1.20)

## 2014-04-29 LAB — MAGNESIUM (CC13): Magnesium: 2 mg/dl (ref 1.5–2.5)

## 2014-04-29 MED ORDER — SODIUM CHLORIDE 0.9 % IV SOLN
120.0000 mg/m2 | Freq: Once | INTRAVENOUS | Status: DC
  Filled 2014-04-29: qty 8.5

## 2014-04-29 MED ORDER — POTASSIUM CHLORIDE 2 MEQ/ML IV SOLN
Freq: Once | INTRAVENOUS | Status: AC
  Administered 2014-04-29: 10:00:00 via INTRAVENOUS
  Filled 2014-04-29: qty 10

## 2014-04-29 MED ORDER — PALONOSETRON HCL INJECTION 0.25 MG/5ML
0.2500 mg | Freq: Once | INTRAVENOUS | Status: AC
  Administered 2014-04-29: 0.25 mg via INTRAVENOUS

## 2014-04-29 MED ORDER — DEXAMETHASONE SODIUM PHOSPHATE 20 MG/5ML IJ SOLN
12.0000 mg | Freq: Once | INTRAMUSCULAR | Status: AC
  Administered 2014-04-29: 12 mg via INTRAVENOUS

## 2014-04-29 MED ORDER — SODIUM CHLORIDE 0.9 % IV SOLN
60.0000 mg/m2 | Freq: Once | INTRAVENOUS | Status: AC
  Administered 2014-04-29: 87 mg via INTRAVENOUS
  Filled 2014-04-29: qty 87

## 2014-04-29 MED ORDER — SODIUM CHLORIDE 0.9 % IV SOLN
150.0000 mg | Freq: Once | INTRAVENOUS | Status: AC
  Administered 2014-04-29: 150 mg via INTRAVENOUS
  Filled 2014-04-29: qty 5

## 2014-04-29 MED ORDER — DEXAMETHASONE SODIUM PHOSPHATE 20 MG/5ML IJ SOLN
INTRAMUSCULAR | Status: AC
  Filled 2014-04-29: qty 5

## 2014-04-29 MED ORDER — PALONOSETRON HCL INJECTION 0.25 MG/5ML
INTRAVENOUS | Status: AC
  Filled 2014-04-29: qty 5

## 2014-04-29 MED ORDER — SODIUM CHLORIDE 0.9 % IV SOLN
Freq: Once | INTRAVENOUS | Status: AC
  Administered 2014-04-29: 10:00:00 via INTRAVENOUS

## 2014-04-29 NOTE — Progress Notes (Addendum)
[facility] [facility] [phone]   [phone]  SHARED VISIT PROGRESS NOTE  Pcp Not In System No address on file  DIAGNOSIS: Limited stage small cell lung cancer diagnosed in March of 2015.  PRIOR THERAPY: Single fraction of radiotherapy to the mediastinum under the care of Dr. Pablo Ledger on 03/13/2014.  CURRENT THERAPY: Systemic chemotherapy with cisplatin 60 mg/M2 day 1 and etoposide 120 mg/M2 on days 1, 2 and 3 with Neulasta support on day 4. First dose on 03/18/2014. Status post 2 cycles.   INTERVAL HISTORY: Brittany [name] 53 y.o. female returns to the clinic today for followup visit. Overall she's tolerating her chemotherapy without difficulty with the exception of some mild fatigue. She is able to work daily and continue with her activities of daily living without decline. She recently had a restaging CT scan of the chest and presents to discuss the results as well as to proceed with cycle #3 of her systemic chemotherapy with cisplatin and etoposide with Neulasta support.  She denied any issues with fever, chills, nausea, vomiting, diarrhea or constipation. Her appetite is good and wight is relatively stable. Overall her energy level remains good although she does have some fatigue after chemotherapy.   The patient is feeling fine today with no specific complaints. She denied having any significant chest pain, shortness of breath, or hemoptysis.   MEDICAL HISTORY: Past Medical History  Diagnosis Date  . Hypertension   . Allergy   . Asthma   . Anxiety   . Small cell lung cancer 03/16/2014    ALLERGIES:  is allergic to codeine; iodinated diagnostic agents; and sulfa antibiotics.  MEDICATIONS:  Current Outpatient Prescriptions  Medication Sig Dispense Refill  . Alum & Mag Hydroxide-Simeth (MAGIC MOUTHWASH W/LIDOCAINE) SOLN Take 5 mLs by mouth 3 (three) times daily as needed for mouth pain.  480 mL  0  . atorvastatin (LIPITOR) 20 MG tablet Take  1 tablet (20 mg total) by mouth daily.  90 tablet  0  . budesonide-formoterol (SYMBICORT) 160-4.5 MCG/ACT inhaler Inhale 2 puffs into the lungs 2 (two) times daily.      Marland Kitchen diltiazem (CARDIZEM CD) 120 MG 24 hr capsule Take 1 capsule (120 mg total) by mouth daily.  30 capsule  0  . feeding supplement, RESOURCE BREEZE, (RESOURCE BREEZE) LIQD Take 1 Container by mouth 2 (two) times daily between meals.  30 Container  0  . fluticasone (FLONASE) 50 MCG/ACT nasal spray Place 2 sprays into both nostrils daily.      Marland Kitchen lisinopril (PRINIVIL,ZESTRIL) 10 MG tablet Take 10 mg by mouth daily.      . metoprolol succinate (TOPROL-XL) 50 MG 24 hr tablet Take 1 tablet (50 mg total) by mouth daily. Take with or immediately following a meal.  30 tablet  3  . albuterol (PROVENTIL HFA) 108 (90 BASE) MCG/ACT inhaler Inhale 2 puffs into the lungs every 4 (four) hours as needed for wheezing or shortness of breath.  6.7 each  5  . albuterol (PROVENTIL) (2.5 MG/3ML) 0.083% nebulizer solution Take 3 mLs (2.5 mg total) by nebulization every 6 (six) hours as needed for wheezing.  150 mL  1  . chlorpheniramine-HYDROcodone (TUSSIONEX PENNKINETIC ER) 10-8 MG/5ML LQCR Take 5 mLs by mouth every 12 (twelve) hours as needed.  60 mL  0  . HYDROcodone-acetaminophen (HYCET) 7.5-325 mg/15 ml solution Take 10 mLs by mouth 4 (four) times daily as needed for moderate pain.  120 mL  0  .  lidocaine-prilocaine (EMLA) cream       . prochlorperazine (COMPAZINE) 10 MG tablet Take 1 tablet (10 mg total) by mouth every 6 (six) hours as needed for nausea or vomiting.  60 tablet  0  . sucralfate (CARAFATE) 1 G tablet Take 1 tablet (1 g total) by mouth 4 (four) times daily -  with meals and at bedtime. Crush and dissolve in 8 oz of water.  90 tablet  1  . Wound Cleansers (RADIAPLEX EX) Apply topically.       No current facility-administered medications for this visit.    SURGICAL HISTORY:  Past Surgical History  Procedure Laterality Date  . Tubal  ligation    . Cesarean section    . Video bronchoscopy with endobronchial ultrasound N/A 03/11/2014    Procedure: VIDEO BRONCHOSCOPY WITH ENDOBRONCHIAL ULTRASOUND;  Surgeon: Melrose Nakayama, MD;  Location: [facility];  Service: Thoracic;  Laterality: N/A;  . Mediastinoscopy N/A 03/11/2014    Procedure: MEDIASTINOSCOPY;  Surgeon: Melrose Nakayama, MD;  Location: [facility];  Service: Thoracic;  Laterality: N/A;    REVIEW OF SYSTEMS:  Constitutional: positive for fatigue Eyes: negative Ears, nose, mouth, throat, and face: negative Respiratory: positive for cough Cardiovascular: negative Gastrointestinal: negative Genitourinary:negative Integument/breast: negative Hematologic/lymphatic: negative Musculoskeletal:negative Neurological: negative Behavioral/Psych: negative Endocrine: negative Allergic/Immunologic: negative   PHYSICAL EXAMINATION: General appearance: alert, cooperative and no distress Head: Normocephalic, without obvious abnormality, atraumatic Neck: no adenopathy, no JVD, supple, symmetrical, trachea midline and thyroid not enlarged, symmetric, no tenderness/mass/nodules Lymph nodes: Cervical, supraclavicular, and axillary nodes normal. Resp: wheezes bilaterally Back: symmetric, no curvature. ROM normal. No CVA tenderness. Cardio: regular rate and rhythm, S1, S2 normal, no murmur, click, rub or gallop GI: soft, non-tender; bowel sounds normal; no masses,  no organomegaly Extremities: extremities normal, atraumatic, no cyanosis or edema Neurologic: Alert and oriented X 3, normal strength and tone. Normal symmetric reflexes. Normal coordination and gait  ECOG PERFORMANCE STATUS: 1 - Symptomatic but completely ambulatory  Blood pressure 136/73, pulse 88, temperature 98.6 F (37 C), temperature source Oral, resp. rate 18, height 4\' 11"  (1.499 m), weight 117 lb 4.8 oz (53.207 kg).  LABORATORY DATA: Lab Results  Component Value Date   WBC 11.0* 04/29/2014   HGB 13.1 04/29/2014    HCT 39.9 04/29/2014   MCV 100.5 04/29/2014   PLT 316 04/29/2014      Chemistry      Component Value Date/Time   NA 143 04/29/2014 0821   NA 138 03/12/2014 0530   K 3.7 04/29/2014 0821   K 4.3 03/12/2014 0530   CL 99 03/12/2014 0530   CO2 23 04/29/2014 0821   CO2 23 03/12/2014 0530   BUN 10.0 04/29/2014 0821   BUN 12 03/12/2014 0530   CREATININE 0.7 04/29/2014 0821   CREATININE 0.67 03/12/2014 0530   CREATININE 0.56 01/01/2014 1111      Component Value Date/Time   CALCIUM 9.4 04/29/2014 0821   CALCIUM 9.1 03/12/2014 0530   ALKPHOS 110 04/29/2014 0821   ALKPHOS 110 03/11/2014 1002   AST 15 04/29/2014 0821   AST 31 03/11/2014 1002   ALT 15 04/29/2014 0821   ALT 26 03/11/2014 1002   BILITOT 0.37 04/29/2014 0821   BILITOT 0.6 03/11/2014 1002       RADIOGRAPHIC STUDIES: Ct Chest Wo Contrast  04/22/2014   CLINICAL DATA:  Small cell lung cancer diagnosed April 2015, ongoing chemotherapy and radiation therapy. Patient refused IV contrast and is usually pre-medicated with Benadryl for a  previous contrast reaction of hives.  EXAM: CT CHEST WITHOUT CONTRAST  TECHNIQUE: Multidetector CT imaging of the chest was performed following the standard protocol without IV contrast.  COMPARISON:  PET-CT 03/25/2014, chest CT 03/05/2014  FINDINGS: Reassessment is suboptimal due to patient refusal of IV contrast.  Right perihilar/subcarinal mass is decreased in size now measuring approximately 5.5 x 4.5 cm image 26, previously 8.0 x 6.5 cm at the same anatomic level. Trace fluid is noted in the superior pericardial recess. Great vessels are normal in caliber. Heart size is normal. Improved aeration of the right middle and lower lobes is noted, with reduced mass effect upon the right mainstem bronchus by the above-mentioned mass. Linear residual right middle lobe atelectasis is noted. No new pulmonary mass, consolidation, or nodule is identified. No pleural effusion. Central airways are patent, with only minimal attenuation of the right  mainstem bronchus compared to the prior exam.  No lytic or sclerotic osseous lesion.  No acute osseous abnormality.  IMPRESSION: Decrease in size of central/right perihilar mass compatible with small cell lung cancer with improved right middle lobe and right lower lobe aeration and reduced mass effect upon the right mainstem bronchus.  Please note on future exams the patient is encouraged to complete premedication if necessary prior to administration of IV contrast for better assessment of the central mass and its relationship to the central mediastinal structures.   Electronically Signed   By: Conchita Paris M.D.   On: 04/22/2014 15:10    ASSESSMENT AND PLAN: This is a very pleasant 53 years old [place] American female recently diagnosed with limited stage small cell lung cancer. She is currently receiving  radiotherapy to the mediastinum under the care of Dr. Pablo Ledger. She status post 2 cycles of systemic chemotherapy with cisplatin at 60 mg per meter square given on day 1 and etoposide at 120 mg per meter square given on days 1, 2 and 3 with Neulasta support given on day 4. Patient was discussed with and also seen by Dr. Julien Nordmann. Her recent CT scan showed improvement in her disease. Her laboratory data was reviewed. She will continue with cycle #3 of her chemotherapy with no dose modifications today as scheduled. She will continue weekly labs as scheduled. She'll followup with Dr. Julien Nordmann in 3 weeks for another symptom management visit prior to cycle #4.  She was advised to call immediately if she has any concerning symptoms in the interval. The patient voices understanding of current disease status and treatment options and is in agreement with the current care plan.  All questions were answered. The patient knows to call the clinic with any problems, questions or concerns. We can certainly see the patient much sooner if necessary.  Carlton Adam, PA-C  ADDENDUM: Hematology/Oncology  Attending: I had a face to face encounter with the patient. I recommended her care plan. This is a very pleasant 53 years old [place] American female with limited stage small cell lung cancer currently undergoing systemic chemotherapy with cisplatin and etoposide status post 2 cycles in addition to concurrent radiation. She is tolerating her current treatment fairly well with no significant adverse effects. The patient denied having any chest pain, shortness breath, cough or hemoptysis. She has no nausea or vomiting. She denied having any significant peripheral neuropathy. Her recent CT scan of the chest showed improvement in her disease. I discussed the scan results and showed the images to the patient today. I recommended for her to continue her current systemic chemotherapy with  cisplatin and etoposide as scheduled. She would come back for follow up visit in 3 weeks with the next cycle of her treatment. She was advised to call immediately if she has any concerning symptoms in the interval.  Disclaimer: This note was dictated with voice recognition software. Similar sounding words can inadvertently be transcribed and may not be corrected upon review. Curt Bears, MD 05/01/2014

## 2014-04-29 NOTE — Progress Notes (Signed)
Nutrition followup completed with patient in chemotherapy.  Patient is being treated for small cell lung cancer.  Patient has a good appetite and is drinking one boost breeze daily. Weight down slightly at 117.3 pounds May 20 from 118 pounds April 28.  Patient denies nutrition side effects.  Nutrition diagnosis: Unintended weight loss improved.  Intervention: Patient has good understanding of strategies for increased calories and protein as well as adding supplements as needed to promote weight maintenance.  Monitoring, evaluation, goals: Patient is tolerating adequate calories and protein, and has had minimal weight loss since last visit.  Next visit: No followup required at this time.  Patient has my contact information if she develops questions or concerns.

## 2014-04-29 NOTE — Patient Instructions (Signed)
Your CT scan 0 improvement in your disease Continue with labs and chemotherapy as scheduled Followup in 3

## 2014-04-29 NOTE — Patient Instructions (Signed)
Etoposide, VP-16 injection What is this medicine? ETOPOSIDE, VP-16 (e toe POE side) is a chemotherapy drug. It is used to treat testicular cancer, lung cancer, and other cancers. This medicine may be used for other purposes; ask your health care provider or pharmacist if you have questions. COMMON BRAND NAME(S): Etopophos, Toposar, VePesid What should I tell my health care provider before I take this medicine? They need to know if you have any of these conditions: -infection -kidney disease -low blood counts, like low white cell, platelet, or red cell counts -an unusual or allergic reaction to etoposide, other chemotherapeutic agents, other medicines, foods, dyes, or preservatives -pregnant or trying to get pregnant -breast-feeding How should I use this medicine? This medicine is for infusion into a vein. It is administered in a hospital or clinic by a specially trained health care professional. Talk to your pediatrician regarding the use of this medicine in children. Special care may be needed. Overdosage: If you think you have taken too much of this medicine contact a poison control center or emergency room at once. NOTE: This medicine is only for you. Do not share this medicine with others. What if I miss a dose? It is important not to miss your dose. Call your doctor or health care professional if you are unable to keep an appointment. What may interact with this medicine? -cyclosporine -medicines to increase blood counts like filgrastim, pegfilgrastim, sargramostim -vaccines This list may not describe all possible interactions. Give your health care provider a list of all the medicines, herbs, non-prescription drugs, or dietary supplements you use. Also tell them if you smoke, drink alcohol, or use illegal drugs. Some items may interact with your medicine. What should I watch for while using this medicine? Visit your doctor for checks on your progress. This drug may make you feel  generally unwell. This is not uncommon, as chemotherapy can affect healthy cells as well as cancer cells. Report any side effects. Continue your course of treatment even though you feel ill unless your doctor tells you to stop. In some cases, you may be given additional medicines to help with side effects. Follow all directions for their use. Call your doctor or health care professional for advice if you get a fever, chills or sore throat, or other symptoms of a cold or flu. Do not treat yourself. This drug decreases your body's ability to fight infections. Try to avoid being around people who are sick. This medicine may increase your risk to bruise or bleed. Call your doctor or health care professional if you notice any unusual bleeding. Be careful brushing and flossing your teeth or using a toothpick because you may get an infection or bleed more easily. If you have any dental work done, tell your dentist you are receiving this medicine. Avoid taking products that contain aspirin, acetaminophen, ibuprofen, naproxen, or ketoprofen unless instructed by your doctor. These medicines may hide a fever. Do not become pregnant while taking this medicine. Women should inform their doctor if they wish to become pregnant or think they might be pregnant. There is a potential for serious side effects to an unborn child. Talk to your health care professional or pharmacist for more information. Do not breast-feed an infant while taking this medicine. What side effects may I notice from receiving this medicine? Side effects that you should report to your doctor or health care professional as soon as possible: -allergic reactions like skin rash, itching or hives, swelling of the face, lips, or tongue -  low blood counts - this medicine may decrease the number of white blood cells, red blood cells and platelets. You may be at increased risk for infections and bleeding. -signs of infection - fever or chills, cough, sore  throat, pain or difficulty passing urine -signs of decreased platelets or bleeding - bruising, pinpoint red spots on the skin, black, tarry stools, blood in the urine -signs of decreased red blood cells - unusually weak or tired, fainting spells, lightheadedness -breathing problems -changes in vision -mouth or throat sores or ulcers -pain, redness, swelling or irritation at the injection site -pain, tingling, numbness in the hands or feet -redness, blistering, peeling or loosening of the skin, including inside the mouth -seizures -vomiting Side effects that usually do not require medical attention (report to your doctor or health care professional if they continue or are bothersome): -diarrhea -hair loss -loss of appetite -nausea -stomach pain This list may not describe all possible side effects. Call your doctor for medical advice about side effects. You may report side effects to FDA at 1-800-FDA-1088. Where should I keep my medicine? This drug is given in a hospital or clinic and will not be stored at home. NOTE: This sheet is a summary. It may not cover all possible information. If you have questions about this medicine, talk to your doctor, pharmacist, or health care provider.  2014, [employer]/Gold Standard. (2008-03-30 17:24:12) Cisplatin injection What is this medicine? CISPLATIN (SIS pla tin) is a chemotherapy drug. It targets fast dividing cells, like cancer cells, and causes these cells to die. This medicine is used to treat many types of cancer like bladder, ovarian, and testicular cancers. This medicine may be used for other purposes; ask your health care provider or pharmacist if you have questions. COMMON BRAND NAME(S): Platinol -AQ, Platinol What should I tell my health care provider before I take this medicine? They need to know if you have any of these conditions: -blood disorders -hearing problems -kidney disease -recent or ongoing radiation therapy -an unusual or  allergic reaction to cisplatin, carboplatin, other chemotherapy, other medicines, foods, dyes, or preservatives -pregnant or trying to get pregnant -breast-feeding How should I use this medicine? This drug is given as an infusion into a vein. It is administered in a hospital or clinic by a specially trained health care professional. Talk to your pediatrician regarding the use of this medicine in children. Special care may be needed. Overdosage: If you think you have taken too much of this medicine contact a poison control center or emergency room at once. NOTE: This medicine is only for you. Do not share this medicine with others. What if I miss a dose? It is important not to miss a dose. Call your doctor or health care professional if you are unable to keep an appointment. What may interact with this medicine? -dofetilide -foscarnet -medicines for seizures -medicines to increase blood counts like filgrastim, pegfilgrastim, sargramostim -probenecid -pyridoxine used with altretamine -rituximab -some antibiotics like amikacin, gentamicin, neomycin, polymyxin B, streptomycin, tobramycin -sulfinpyrazone -vaccines -zalcitabine Talk to your doctor or health care professional before taking any of these medicines: -acetaminophen -aspirin -ibuprofen -ketoprofen -naproxen This list may not describe all possible interactions. Give your health care provider a list of all the medicines, herbs, non-prescription drugs, or dietary supplements you use. Also tell them if you smoke, drink alcohol, or use illegal drugs. Some items may interact with your medicine. What should I watch for while using this medicine? Your condition will be monitored carefully while you  are receiving this medicine. You will need important blood work done while you are taking this medicine. This drug may make you feel generally unwell. This is not uncommon, as chemotherapy can affect healthy cells as well as cancer cells.  Report any side effects. Continue your course of treatment even though you feel ill unless your doctor tells you to stop. In some cases, you may be given additional medicines to help with side effects. Follow all directions for their use. Call your doctor or health care professional for advice if you get a fever, chills or sore throat, or other symptoms of a cold or flu. Do not treat yourself. This drug decreases your body's ability to fight infections. Try to avoid being around people who are sick. This medicine may increase your risk to bruise or bleed. Call your doctor or health care professional if you notice any unusual bleeding. Be careful brushing and flossing your teeth or using a toothpick because you may get an infection or bleed more easily. If you have any dental work done, tell your dentist you are receiving this medicine. Avoid taking products that contain aspirin, acetaminophen, ibuprofen, naproxen, or ketoprofen unless instructed by your doctor. These medicines may hide a fever. Do not become pregnant while taking this medicine. Women should inform their doctor if they wish to become pregnant or think they might be pregnant. There is a potential for serious side effects to an unborn child. Talk to your health care professional or pharmacist for more information. Do not breast-feed an infant while taking this medicine. Drink fluids as directed while you are taking this medicine. This will help protect your kidneys. Call your doctor or health care professional if you get diarrhea. Do not treat yourself. What side effects may I notice from receiving this medicine? Side effects that you should report to your doctor or health care professional as soon as possible: -allergic reactions like skin rash, itching or hives, swelling of the face, lips, or tongue -signs of infection - fever or chills, cough, sore throat, pain or difficulty passing urine -signs of decreased platelets or bleeding -  bruising, pinpoint red spots on the skin, black, tarry stools, nosebleeds -signs of decreased red blood cells - unusually weak or tired, fainting spells, lightheadedness -breathing problems -changes in hearing -gout pain -low blood counts - This drug may decrease the number of white blood cells, red blood cells and platelets. You may be at increased risk for infections and bleeding. -nausea and vomiting -pain, swelling, redness or irritation at the injection site -pain, tingling, numbness in the hands or feet -problems with balance, movement -trouble passing urine or change in the amount of urine Side effects that usually do not require medical attention (report to your doctor or health care professional if they continue or are bothersome): -changes in vision -loss of appetite -metallic taste in the mouth or changes in taste This list may not describe all possible side effects. Call your doctor for medical advice about side effects. You may report side effects to FDA at 1-800-FDA-1088. Where should I keep my medicine? This drug is given in a hospital or clinic and will not be stored at home. NOTE: This sheet is a summary. It may not cover all possible information. If you have questions about this medicine, talk to your doctor, pharmacist, or health care provider.  2014, [employer]vier/Gold Standard. (2008-03-03 14:40:54)

## 2014-04-29 NOTE — Telephone Encounter (Signed)
gv adn printed appt sched and avs for pt for May adn June....sed added tx.

## 2014-04-30 ENCOUNTER — Ambulatory Visit
Admission: RE | Admit: 2014-04-30 | Discharge: 2014-04-30 | Disposition: A | Discharge status: Home / Self Care | Payer: BC Managed Care – PPO | Source: Ambulatory Visit | Attending: Radiation Oncology | Admitting: Radiation Oncology

## 2014-04-30 ENCOUNTER — Ambulatory Visit (HOSPITAL_BASED_OUTPATIENT_CLINIC_OR_DEPARTMENT_OTHER): Payer: BC Managed Care – PPO

## 2014-04-30 VITALS — BP 130/68 | HR 89 | Temp 98.4°F | Resp 18

## 2014-04-30 DIAGNOSIS — Z5111 Encounter for antineoplastic chemotherapy: Secondary | ICD-10-CM

## 2014-04-30 DIAGNOSIS — C3410 Malignant neoplasm of upper lobe, unspecified bronchus or lung: Secondary | ICD-10-CM

## 2014-04-30 DIAGNOSIS — C3490 Malignant neoplasm of unspecified part of unspecified bronchus or lung: Secondary | ICD-10-CM

## 2014-04-30 MED ORDER — SODIUM CHLORIDE 0.9 % IJ SOLN
10.0000 mL | INTRAMUSCULAR | Status: DC | PRN
  Filled 2014-04-30: qty 10

## 2014-04-30 MED ORDER — DEXAMETHASONE SODIUM PHOSPHATE 10 MG/ML IJ SOLN
INTRAMUSCULAR | Status: AC
  Filled 2014-04-30: qty 1

## 2014-04-30 MED ORDER — ETOPOSIDE CHEMO INJECTION 1 GM/50ML
120.0000 mg/m2 | Freq: Once | INTRAVENOUS | Status: AC
  Administered 2014-04-30: 170 mg via INTRAVENOUS
  Filled 2014-04-30: qty 8.5

## 2014-04-30 MED ORDER — SODIUM CHLORIDE 0.9 % IV SOLN
Freq: Once | INTRAVENOUS | Status: AC
  Administered 2014-04-30: 13:00:00 via INTRAVENOUS

## 2014-04-30 MED ORDER — DEXAMETHASONE SODIUM PHOSPHATE 10 MG/ML IJ SOLN
10.0000 mg | Freq: Once | INTRAMUSCULAR | Status: AC
  Administered 2014-04-30: 10 mg via INTRAVENOUS

## 2014-04-30 MED ORDER — HEPARIN SOD (PORK) LOCK FLUSH 100 UNIT/ML IV SOLN
500.0000 [IU] | Freq: Once | INTRAVENOUS | Status: DC | PRN
  Filled 2014-04-30: qty 5

## 2014-04-30 NOTE — Patient Instructions (Signed)
[facility] Discharge Instructions for Patients Receiving Chemotherapy  Today you received the following chemotherapy agents: Etoposide  To help prevent nausea and vomiting after your treatment, we encourage you to take your nausea medication as prescribed.   If you develop nausea and vomiting that is not controlled by your nausea medication, call the clinic.   BELOW ARE SYMPTOMS THAT SHOULD BE REPORTED IMMEDIATELY:  *FEVER GREATER THAN 100.5 F  *CHILLS WITH OR WITHOUT FEVER  NAUSEA AND VOMITING THAT IS NOT CONTROLLED WITH YOUR NAUSEA MEDICATION  *UNUSUAL SHORTNESS OF BREATH  *UNUSUAL BRUISING OR BLEEDING  TENDERNESS IN MOUTH AND THROAT WITH OR WITHOUT PRESENCE OF ULCERS  *URINARY PROBLEMS  *BOWEL PROBLEMS  UNUSUAL RASH Items with * indicate a potential emergency and should be followed up as soon as possible.  Feel free to call the clinic you have any questions or concerns. The clinic phone number is [phone].

## 2014-05-01 ENCOUNTER — Ambulatory Visit
Admission: RE | Admit: 2014-05-01 | Discharge: 2014-05-01 | Disposition: A | Discharge status: Home / Self Care | Payer: BC Managed Care – PPO | Source: Ambulatory Visit | Attending: Radiation Oncology | Admitting: Radiation Oncology

## 2014-05-01 ENCOUNTER — Ambulatory Visit (HOSPITAL_BASED_OUTPATIENT_CLINIC_OR_DEPARTMENT_OTHER): Payer: BC Managed Care – PPO

## 2014-05-01 VITALS — BP 123/73 | HR 69 | Temp 98.4°F | Resp 16

## 2014-05-01 DIAGNOSIS — C3490 Malignant neoplasm of unspecified part of unspecified bronchus or lung: Secondary | ICD-10-CM

## 2014-05-01 DIAGNOSIS — Z5111 Encounter for antineoplastic chemotherapy: Secondary | ICD-10-CM

## 2014-05-01 DIAGNOSIS — C3410 Malignant neoplasm of upper lobe, unspecified bronchus or lung: Secondary | ICD-10-CM

## 2014-05-01 MED ORDER — DEXAMETHASONE SODIUM PHOSPHATE 10 MG/ML IJ SOLN
10.0000 mg | Freq: Once | INTRAMUSCULAR | Status: AC
  Administered 2014-05-01: 10 mg via INTRAVENOUS

## 2014-05-01 MED ORDER — SODIUM CHLORIDE 0.9 % IV SOLN
Freq: Once | INTRAVENOUS | Status: AC
  Administered 2014-05-01: 13:00:00 via INTRAVENOUS

## 2014-05-01 MED ORDER — SODIUM CHLORIDE 0.9 % IV SOLN
120.0000 mg/m2 | Freq: Once | INTRAVENOUS | Status: AC
  Administered 2014-05-01: 170 mg via INTRAVENOUS
  Filled 2014-05-01: qty 8.5

## 2014-05-01 MED ORDER — DEXAMETHASONE SODIUM PHOSPHATE 10 MG/ML IJ SOLN
INTRAMUSCULAR | Status: AC
  Filled 2014-05-01: qty 1

## 2014-05-01 NOTE — Patient Instructions (Signed)
[facility] Discharge Instructions for Patients Receiving Chemotherapy  Today you received the following chemotherapy agents etoposide.  To help prevent nausea and vomiting after your treatment, we encourage you to take your nausea medication compazine.   If you develop nausea and vomiting that is not controlled by your nausea medication, call the clinic.   BELOW ARE SYMPTOMS THAT SHOULD BE REPORTED IMMEDIATELY:  *FEVER GREATER THAN 100.5 F  *CHILLS WITH OR WITHOUT FEVER  NAUSEA AND VOMITING THAT IS NOT CONTROLLED WITH YOUR NAUSEA MEDICATION  *UNUSUAL SHORTNESS OF BREATH  *UNUSUAL BRUISING OR BLEEDING  TENDERNESS IN MOUTH AND THROAT WITH OR WITHOUT PRESENCE OF ULCERS  *URINARY PROBLEMS  *BOWEL PROBLEMS  UNUSUAL RASH Items with * indicate a potential emergency and should be followed up as soon as possible.  Feel free to call the clinic you have any questions or concerns. The clinic phone number is [phone].

## 2014-05-02 ENCOUNTER — Ambulatory Visit (HOSPITAL_BASED_OUTPATIENT_CLINIC_OR_DEPARTMENT_OTHER): Payer: BC Managed Care – PPO

## 2014-05-02 VITALS — BP 152/93 | HR 82 | Temp 98.5°F | Resp 20

## 2014-05-02 DIAGNOSIS — C3490 Malignant neoplasm of unspecified part of unspecified bronchus or lung: Secondary | ICD-10-CM

## 2014-05-02 DIAGNOSIS — Z5189 Encounter for other specified aftercare: Secondary | ICD-10-CM

## 2014-05-02 DIAGNOSIS — C7A1 Malignant poorly differentiated neuroendocrine tumors: Secondary | ICD-10-CM

## 2014-05-02 MED ORDER — PEGFILGRASTIM INJECTION 6 MG/0.6ML
6.0000 mg | Freq: Once | SUBCUTANEOUS | Status: AC
  Administered 2014-05-02: 6 mg via SUBCUTANEOUS

## 2014-05-02 NOTE — Patient Instructions (Signed)

## 2014-05-05 ENCOUNTER — Ambulatory Visit
Admission: RE | Admit: 2014-05-05 | Discharge: 2014-05-05 | Disposition: A | Discharge status: Home / Self Care | Payer: BC Managed Care – PPO | Source: Ambulatory Visit | Attending: Radiation Oncology | Admitting: Radiation Oncology

## 2014-05-05 VITALS — BP 119/78 | HR 92 | Temp 98.7°F | Wt 116.5 lb

## 2014-05-05 DIAGNOSIS — C3490 Malignant neoplasm of unspecified part of unspecified bronchus or lung: Secondary | ICD-10-CM

## 2014-05-05 NOTE — Progress Notes (Signed)
Weekly Management Note Current Dose: 45 Gy  Projected Dose: 57 Gy   Narrative:  The patient presents for routine under treatment assessment.  CBCT/MVCT images/Port film x-rays were reviewed.  The chart was checked. Doing well. No complaints. Swallowing well. Good energy. Scan scheduled per [facility] in mid June after 4th cycle of chemo  Physical Findings:  Unchanged. Small area of redness in posterior mid back  Vitals:  Filed Vitals:   05/05/14 1613  BP: 119/78  Pulse: 92  Temp: 98.7 F (37.1 C)   Weight:  Wt Readings from Last 3 Encounters:  05/05/14 116 lb 8 oz (52.844 kg)  04/29/14 117 lb 4.8 oz (53.207 kg)  04/28/14 118 lb 6.4 oz (53.706 kg)   Lab Results  Component Value Date   WBC 11.0* 04/29/2014   HGB 13.1 04/29/2014   HCT 39.9 04/29/2014   MCV 100.5 04/29/2014   PLT 316 04/29/2014   Lab Results  Component Value Date   CREATININE 0.7 04/29/2014   BUN 10.0 04/29/2014   NA 143 04/29/2014   K 3.7 04/29/2014   CL 99 03/12/2014   CO2 23 04/29/2014     Impression:  The patient is tolerating radiation.  Plan:  Continue treatment as planned. Discussed need for PCI. Discussed continued radiaplex.

## 2014-05-06 ENCOUNTER — Ambulatory Visit
Admission: RE | Admit: 2014-05-06 | Discharge: 2014-05-06 | Disposition: A | Discharge status: Home / Self Care | Payer: BC Managed Care – PPO | Source: Ambulatory Visit | Attending: Radiation Oncology | Admitting: Radiation Oncology

## 2014-05-06 ENCOUNTER — Other Ambulatory Visit (HOSPITAL_BASED_OUTPATIENT_CLINIC_OR_DEPARTMENT_OTHER): Payer: BC Managed Care – PPO

## 2014-05-06 DIAGNOSIS — C3490 Malignant neoplasm of unspecified part of unspecified bronchus or lung: Secondary | ICD-10-CM

## 2014-05-06 DIAGNOSIS — C3410 Malignant neoplasm of upper lobe, unspecified bronchus or lung: Secondary | ICD-10-CM

## 2014-05-06 LAB — CBC WITH DIFFERENTIAL/PLATELET
BASO%: 0.3 % (ref 0.0–2.0)
Basophils Absolute: 0 10*3/uL (ref 0.0–0.1)
EOS%: 0.6 % (ref 0.0–7.0)
Eosinophils Absolute: 0.1 10*3/uL (ref 0.0–0.5)
HCT: 34.3 % — ABNORMAL LOW (ref 34.8–46.6)
HGB: 11.1 g/dL — ABNORMAL LOW (ref 11.6–15.9)
LYMPH#: 0.5 10*3/uL — AB (ref 0.9–3.3)
LYMPH%: 3.1 % — ABNORMAL LOW (ref 14.0–49.7)
MCH: 32.9 pg (ref 25.1–34.0)
MCHC: 32.4 g/dL (ref 31.5–36.0)
MCV: 101.4 fL — ABNORMAL HIGH (ref 79.5–101.0)
MONO#: 1.1 10*3/uL — ABNORMAL HIGH (ref 0.1–0.9)
MONO%: 6.7 % (ref 0.0–14.0)
NEUT#: 14.5 10*3/uL — ABNORMAL HIGH (ref 1.5–6.5)
NEUT%: 89.3 % — AB (ref 38.4–76.8)
Platelets: 167 10*3/uL (ref 145–400)
RBC: 3.39 10*6/uL — ABNORMAL LOW (ref 3.70–5.45)
RDW: 26.8 % — ABNORMAL HIGH (ref 11.2–14.5)
WBC: 16.2 10*3/uL — ABNORMAL HIGH (ref 3.9–10.3)

## 2014-05-06 LAB — MAGNESIUM (CC13): Magnesium: 1.7 mg/dl (ref 1.5–2.5)

## 2014-05-06 LAB — COMPREHENSIVE METABOLIC PANEL (CC13)
ALK PHOS: 161 U/L — AB (ref 40–150)
ALT: 9 U/L (ref 0–55)
AST: 9 U/L (ref 5–34)
Albumin: 3.6 g/dL (ref 3.5–5.0)
Anion Gap: 11 mEq/L (ref 3–11)
BUN: 13.5 mg/dL (ref 7.0–26.0)
CHLORIDE: 100 meq/L (ref 98–109)
CO2: 27 mEq/L (ref 22–29)
CREATININE: 0.7 mg/dL (ref 0.6–1.1)
Calcium: 9.8 mg/dL (ref 8.4–10.4)
Glucose: 196 mg/dl — ABNORMAL HIGH (ref 70–140)
Potassium: 3.6 mEq/L (ref 3.5–5.1)
Sodium: 137 mEq/L (ref 136–145)
Total Bilirubin: 0.99 mg/dL (ref 0.20–1.20)
Total Protein: 6.4 g/dL (ref 6.4–8.3)

## 2014-05-07 ENCOUNTER — Ambulatory Visit
Admission: RE | Admit: 2014-05-07 | Discharge: 2014-05-07 | Disposition: A | Discharge status: Home / Self Care | Payer: BC Managed Care – PPO | Source: Ambulatory Visit | Attending: Radiation Oncology | Admitting: Radiation Oncology

## 2014-05-08 ENCOUNTER — Ambulatory Visit
Admission: RE | Admit: 2014-05-08 | Discharge: 2014-05-08 | Disposition: A | Discharge status: Home / Self Care | Payer: BC Managed Care – PPO | Source: Ambulatory Visit | Attending: Radiation Oncology | Admitting: Radiation Oncology

## 2014-05-11 ENCOUNTER — Ambulatory Visit
Admission: RE | Admit: 2014-05-11 | Discharge: 2014-05-11 | Disposition: A | Discharge status: Home / Self Care | Payer: BC Managed Care – PPO | Source: Ambulatory Visit | Attending: Radiation Oncology | Admitting: Radiation Oncology

## 2014-05-11 DIAGNOSIS — Z510 Encounter for antineoplastic radiation therapy: Secondary | ICD-10-CM | POA: Diagnosis present

## 2014-05-11 DIAGNOSIS — C3400 Malignant neoplasm of unspecified main bronchus: Secondary | ICD-10-CM | POA: Diagnosis not present

## 2014-05-12 ENCOUNTER — Ambulatory Visit
Admission: RE | Admit: 2014-05-12 | Discharge: 2014-05-12 | Disposition: A | Discharge status: Home / Self Care | Payer: BC Managed Care – PPO | Source: Ambulatory Visit | Attending: Radiation Oncology | Admitting: Radiation Oncology

## 2014-05-12 VITALS — BP 125/83 | HR 107 | Temp 98.6°F | Wt 117.1 lb

## 2014-05-12 DIAGNOSIS — C3490 Malignant neoplasm of unspecified part of unspecified bronchus or lung: Secondary | ICD-10-CM

## 2014-05-12 DIAGNOSIS — Z510 Encounter for antineoplastic radiation therapy: Secondary | ICD-10-CM | POA: Diagnosis not present

## 2014-05-12 NOTE — Progress Notes (Signed)
Patient for weekly assessment of radiation to right lung.Completed 25 of 26 treatments.Last treatment on tomorrow.Denies pain, cough or shortness of breath.Radiation skin burn greater posterior than anterior, continue application of radiaplex at least twice daily.Given card to schedule one month follow up.Knows to call if any questions or concerns.Last chemotherapy scheduled for 05/20/14 and afterwards ct of chest to be ordered by Dr.Mohamed.

## 2014-05-12 NOTE — Progress Notes (Signed)
  Radiation Oncology         [phone] ________________________________  Name: Brittany [name] MRN: [id number]  Date: 05/12/2014  DOB: 1961/02/18  Weekly Radiation Therapy Management Small cell lung cancer   Primary site: Lung (Right)   Staging method: AJCC 7th Edition   Clinical: Stage IIIB (T3, N3, M0) signed by Curt Bears, MD on 03/16/2014  5:13 PM   Summary: Stage IIIB (T3, N3, M0)  Current Dose: 55 Gy     Planned Dose:  57 Gy  Narrative . . . . . . . . The patient presents for routine under treatment assessment.                                   The patient is without complaint. She has tolerated the treatment quite well.                                 Set-up films were reviewed.                                 The chart was checked. Physical Findings. . .  weight is 117 lb 1.6 oz (53.116 kg). Her temperature is 98.6 F (37 C). Her blood pressure is 125/83 and her pulse is 107. Her oxygen saturation is 100%. . The lungs are clear. The heart has a regular rhythm and rate.  The back area shows an area of hyperpigmentation changes and erythema without moist desquamation.  Impression . . . . . . . The patient is tolerating radiation. Plan . . . . . . . . . . . . Continue treatment as planned. She will have family members place radiaplex on her back.  ________________________________   Blair Promise, PhD, MD

## 2014-05-13 ENCOUNTER — Other Ambulatory Visit (HOSPITAL_BASED_OUTPATIENT_CLINIC_OR_DEPARTMENT_OTHER): Payer: BC Managed Care – PPO

## 2014-05-13 ENCOUNTER — Encounter: Payer: Self-pay | Admitting: Radiation Oncology

## 2014-05-13 ENCOUNTER — Ambulatory Visit
Admission: RE | Admit: 2014-05-13 | Discharge: 2014-05-13 | Disposition: A | Discharge status: Home / Self Care | Payer: BC Managed Care – PPO | Source: Ambulatory Visit | Attending: Radiation Oncology | Admitting: Radiation Oncology

## 2014-05-13 DIAGNOSIS — C3490 Malignant neoplasm of unspecified part of unspecified bronchus or lung: Secondary | ICD-10-CM

## 2014-05-13 DIAGNOSIS — Z510 Encounter for antineoplastic radiation therapy: Secondary | ICD-10-CM | POA: Diagnosis not present

## 2014-05-13 DIAGNOSIS — C7A1 Malignant poorly differentiated neuroendocrine tumors: Secondary | ICD-10-CM

## 2014-05-13 LAB — CBC WITH DIFFERENTIAL/PLATELET
BASO%: 0.2 % (ref 0.0–2.0)
Basophils Absolute: 0 10e3/uL (ref 0.0–0.1)
EOS%: 1.1 % (ref 0.0–7.0)
Eosinophils Absolute: 0.1 10e3/uL (ref 0.0–0.5)
HCT: 32.4 % — ABNORMAL LOW (ref 34.8–46.6)
HGB: 11 g/dL — ABNORMAL LOW (ref 11.6–15.9)
LYMPH%: 6.6 % — ABNORMAL LOW (ref 14.0–49.7)
MCH: 33.8 pg (ref 25.1–34.0)
MCHC: 34 g/dL (ref 31.5–36.0)
MCV: 99.7 fL (ref 79.5–101.0)
MONO#: 0.9 10e3/uL (ref 0.1–0.9)
MONO%: 8.4 % (ref 0.0–14.0)
NEUT#: 8.6 10e3/uL — ABNORMAL HIGH (ref 1.5–6.5)
NEUT%: 83.7 % — ABNORMAL HIGH (ref 38.4–76.8)
Platelets: 118 10e3/uL — ABNORMAL LOW (ref 145–400)
RBC: 3.25 10e6/uL — ABNORMAL LOW (ref 3.70–5.45)
RDW: 24.6 % — ABNORMAL HIGH (ref 11.2–14.5)
WBC: 10.2 10e3/uL (ref 3.9–10.3)
lymph#: 0.7 10e3/uL — ABNORMAL LOW (ref 0.9–3.3)
nRBC: 1 % — ABNORMAL HIGH (ref 0–0)

## 2014-05-13 LAB — COMPREHENSIVE METABOLIC PANEL (CC13)
ALT: 11 U/L (ref 0–55)
ANION GAP: 14 meq/L — AB (ref 3–11)
AST: 13 U/L (ref 5–34)
Albumin: 3.5 g/dL (ref 3.5–5.0)
Alkaline Phosphatase: 144 U/L (ref 40–150)
BUN: 10.7 mg/dL (ref 7.0–26.0)
CALCIUM: 9.4 mg/dL (ref 8.4–10.4)
CHLORIDE: 103 meq/L (ref 98–109)
CO2: 21 meq/L — AB (ref 22–29)
Creatinine: 0.7 mg/dL (ref 0.6–1.1)
GLUCOSE: 97 mg/dL (ref 70–140)
Potassium: 3.6 mEq/L (ref 3.5–5.1)
Sodium: 137 mEq/L (ref 136–145)
Total Bilirubin: 0.48 mg/dL (ref 0.20–1.20)
Total Protein: 6.5 g/dL (ref 6.4–8.3)

## 2014-05-13 LAB — MAGNESIUM (CC13): MAGNESIUM: 1.9 mg/dL (ref 1.5–2.5)

## 2014-05-15 NOTE — Progress Notes (Signed)
  Radiation Oncology         [phone] ________________________________  Name: Brittany [name] MRN: [id number]  Date: 05/13/2014  DOB: 05/12/1961  End of Treatment Note  Diagnosis:   Limited Stage Small Cell Lung Cancer     Indication for treatment:  Curative       Radiation treatment dates:   04/07/2014-05/13/2014  Site/dose:   Left lung/ 52 Gy at 2 Gy per fraction (Previous 5 Gy x 1)   Beams/energy:   6 MV photons with a 2 rapid arc plan   Narrative: The patient tolerated radiation treatment relatively well.   She had minimal dysphagia and mild hyperpigmentation. She was able to work during treatment. She received concurrent cisplatin and etoposide.   Plan: The patient has completed radiation treatment. The patient will return to radiation oncology clinic for routine followup in one month. I advised them to call or return sooner if they have any questions or concerns related to their recovery or treatment. She will follow up with medical oncology and return for prophylactic cranial radiation following chemotherapy.   ------------------------------------------------  [name], MD

## 2014-05-20 ENCOUNTER — Ambulatory Visit (HOSPITAL_BASED_OUTPATIENT_CLINIC_OR_DEPARTMENT_OTHER): Payer: BC Managed Care – PPO | Admitting: Physician Assistant

## 2014-05-20 ENCOUNTER — Encounter: Payer: Self-pay | Admitting: Physician Assistant

## 2014-05-20 ENCOUNTER — Telehealth: Payer: Self-pay | Admitting: *Deleted

## 2014-05-20 ENCOUNTER — Ambulatory Visit (HOSPITAL_BASED_OUTPATIENT_CLINIC_OR_DEPARTMENT_OTHER): Payer: BC Managed Care – PPO

## 2014-05-20 ENCOUNTER — Telehealth: Payer: Self-pay | Admitting: Internal Medicine

## 2014-05-20 ENCOUNTER — Other Ambulatory Visit (HOSPITAL_BASED_OUTPATIENT_CLINIC_OR_DEPARTMENT_OTHER): Payer: BC Managed Care – PPO

## 2014-05-20 ENCOUNTER — Encounter: Payer: Self-pay | Admitting: Internal Medicine

## 2014-05-20 VITALS — BP 119/69 | HR 108 | Temp 98.6°F | Resp 18 | Ht 59.0 in | Wt 115.4 lb

## 2014-05-20 DIAGNOSIS — C3490 Malignant neoplasm of unspecified part of unspecified bronchus or lung: Secondary | ICD-10-CM

## 2014-05-20 DIAGNOSIS — Z5111 Encounter for antineoplastic chemotherapy: Secondary | ICD-10-CM

## 2014-05-20 LAB — COMPREHENSIVE METABOLIC PANEL (CC13)
ALBUMIN: 3.8 g/dL (ref 3.5–5.0)
ALT: 15 U/L (ref 0–55)
ANION GAP: 11 meq/L (ref 3–11)
AST: 17 U/L (ref 5–34)
Alkaline Phosphatase: 113 U/L (ref 40–150)
BILIRUBIN TOTAL: 0.57 mg/dL (ref 0.20–1.20)
BUN: 8.9 mg/dL (ref 7.0–26.0)
CO2: 24 meq/L (ref 22–29)
Calcium: 9.4 mg/dL (ref 8.4–10.4)
Chloride: 106 mEq/L (ref 98–109)
Creatinine: 0.7 mg/dL (ref 0.6–1.1)
Glucose: 168 mg/dl — ABNORMAL HIGH (ref 70–140)
POTASSIUM: 3.8 meq/L (ref 3.5–5.1)
SODIUM: 141 meq/L (ref 136–145)
TOTAL PROTEIN: 6.5 g/dL (ref 6.4–8.3)

## 2014-05-20 LAB — CBC WITH DIFFERENTIAL/PLATELET
BASO%: 0.3 % (ref 0.0–2.0)
Basophils Absolute: 0 10*3/uL (ref 0.0–0.1)
EOS ABS: 0.1 10*3/uL (ref 0.0–0.5)
EOS%: 1.5 % (ref 0.0–7.0)
HCT: 36.9 % (ref 34.8–46.6)
HEMOGLOBIN: 12.1 g/dL (ref 11.6–15.9)
LYMPH#: 0.5 10*3/uL — AB (ref 0.9–3.3)
LYMPH%: 8.1 % — ABNORMAL LOW (ref 14.0–49.7)
MCH: 34.4 pg — ABNORMAL HIGH (ref 25.1–34.0)
MCHC: 32.8 g/dL (ref 31.5–36.0)
MCV: 104.8 fL — AB (ref 79.5–101.0)
MONO#: 0.7 10*3/uL (ref 0.1–0.9)
MONO%: 12.2 % (ref 0.0–14.0)
NEUT#: 4.5 10*3/uL (ref 1.5–6.5)
NEUT%: 77.9 % — ABNORMAL HIGH (ref 38.4–76.8)
NRBC: 1 % — AB (ref 0–0)
Platelets: 271 10*3/uL (ref 145–400)
RBC: 3.52 10*6/uL — AB (ref 3.70–5.45)
RDW: 26 % — AB (ref 11.2–14.5)
WBC: 5.8 10*3/uL (ref 3.9–10.3)

## 2014-05-20 LAB — MAGNESIUM (CC13): Magnesium: 2 mg/dl (ref 1.5–2.5)

## 2014-05-20 MED ORDER — SODIUM CHLORIDE 0.9 % IV SOLN
120.0000 mg/m2 | Freq: Once | INTRAVENOUS | Status: AC
  Administered 2014-05-20: 170 mg via INTRAVENOUS
  Filled 2014-05-20: qty 8.5

## 2014-05-20 MED ORDER — SODIUM CHLORIDE 0.9 % IV SOLN
60.0000 mg/m2 | Freq: Once | INTRAVENOUS | Status: AC
  Administered 2014-05-20: 87 mg via INTRAVENOUS
  Filled 2014-05-20: qty 87

## 2014-05-20 MED ORDER — MANNITOL 25 % IV SOLN
Freq: Once | INTRAVENOUS | Status: AC
  Administered 2014-05-20: 10:00:00 via INTRAVENOUS
  Filled 2014-05-20: qty 10

## 2014-05-20 MED ORDER — DEXAMETHASONE SODIUM PHOSPHATE 20 MG/5ML IJ SOLN
12.0000 mg | Freq: Once | INTRAMUSCULAR | Status: AC
Start: 2014-05-20 — End: 2014-05-20
  Administered 2014-05-20: 12 mg via INTRAVENOUS

## 2014-05-20 MED ORDER — PALONOSETRON HCL INJECTION 0.25 MG/5ML
0.2500 mg | Freq: Once | INTRAVENOUS | Status: AC
  Administered 2014-05-20: 0.25 mg via INTRAVENOUS

## 2014-05-20 MED ORDER — SODIUM CHLORIDE 0.9 % IV SOLN
150.0000 mg | Freq: Once | INTRAVENOUS | Status: AC
  Administered 2014-05-20: 150 mg via INTRAVENOUS
  Filled 2014-05-20: qty 5

## 2014-05-20 MED ORDER — SODIUM CHLORIDE 0.9 % IV SOLN
Freq: Once | INTRAVENOUS | Status: AC
  Administered 2014-05-20: 12:00:00 via INTRAVENOUS

## 2014-05-20 MED ORDER — PREDNISONE 50 MG PO TABS: ORAL_TABLET | ORAL | Status: DC

## 2014-05-20 MED ORDER — PALONOSETRON HCL INJECTION 0.25 MG/5ML
INTRAVENOUS | Status: AC
  Filled 2014-05-20: qty 5

## 2014-05-20 MED ORDER — DEXAMETHASONE SODIUM PHOSPHATE 20 MG/5ML IJ SOLN
INTRAMUSCULAR | Status: AC
  Filled 2014-05-20: qty 5

## 2014-05-20 NOTE — Telephone Encounter (Signed)
Gave pt appt for lab,md and chemo for June and july 2015

## 2014-05-20 NOTE — Patient Instructions (Signed)
[facility] Discharge Instructions for Patients Receiving Chemotherapy  Today you received the following chemotherapy agents: etoposide, cisplatin  To help prevent nausea and vomiting after your treatment, we encourage you to take your nausea medication as prescribed.    If you develop nausea and vomiting that is not controlled by your nausea medication, call the clinic.   BELOW ARE SYMPTOMS THAT SHOULD BE REPORTED IMMEDIATELY:  *FEVER GREATER THAN 100.5 F  *CHILLS WITH OR WITHOUT FEVER  NAUSEA AND VOMITING THAT IS NOT CONTROLLED WITH YOUR NAUSEA MEDICATION  *UNUSUAL SHORTNESS OF BREATH  *UNUSUAL BRUISING OR BLEEDING  TENDERNESS IN MOUTH AND THROAT WITH OR WITHOUT PRESENCE OF ULCERS  *URINARY PROBLEMS  *BOWEL PROBLEMS  UNUSUAL RASH Items with * indicate a potential emergency and should be followed up as soon as possible.  Feel free to call the clinic you have any questions or concerns. The clinic phone number is [phone].

## 2014-05-20 NOTE — Progress Notes (Signed)
[facility] [phone]   [phone]) [phone]  SHARED VISIT PROGRESS NOTE  Pcp Not In System No address on file  DIAGNOSIS: Limited stage small cell lung cancer diagnosed in March of 2015.  PRIOR THERAPY:  1. Single fraction of radiotherapy to the mediastinum under the care of Dr. Pablo Ledger on 03/13/2014. 2. Status post palliative radiation to the central lung mass under the care of Dr. Pablo Ledger completed 05/12/2014  CURRENT THERAPY: Systemic chemotherapy with cisplatin 60 mg/M2 day 1 and etoposide 120 mg/M2 on days 1, 2 and 3 with Neulasta support on day 4. First dose on 03/18/2014. Status post 3 cycles.   INTERVAL HISTORY: Brittany [name] 53 y.o. female returns to the clinic today for followup visit. Overall she's tolerating her chemotherapy without difficulty with the exception of some mild fatigue. She did does report increased fatigue related to the radiation concurrent with her chemotherapy. She completed her course of radiation therapy under the care of Dr. Pablo Ledger on 05/12/2014. Her appetite remains good although she is switched to lighter foods such as salads and fruits due to the heat. She did not receive premedication with prednisone and Benadryl prior to her last CT scan however is willing to proceed with this pretreatment for the next scheduled restaging CT scan. She is able to work daily and continue with her activities of daily living without decline. She presents to proceed with cycle #4 of her systemic chemotherapy with cisplatin and etoposide with Neulasta support.  She denied any issues with fever, chills, nausea, vomiting, diarrhea or constipation. Her appetite is good and wight is relatively stable. The patient is feeling fine today with no specific complaints. She denied having any significant chest pain, shortness of breath, or hemoptysis.   MEDICAL HISTORY: Past Medical History  Diagnosis Date  . Hypertension   . Allergy   .  Asthma   . Anxiety   . Small cell lung cancer 03/16/2014    ALLERGIES:  is allergic to codeine; iodinated diagnostic agents; and sulfa antibiotics.  MEDICATIONS:  Current Outpatient Prescriptions  Medication Sig Dispense Refill  . albuterol (PROVENTIL HFA) 108 (90 BASE) MCG/ACT inhaler Inhale 2 puffs into the lungs every 4 (four) hours as needed for wheezing or shortness of breath.  6.7 each  5  . atorvastatin (LIPITOR) 20 MG tablet Take 1 tablet (20 mg total) by mouth daily.  90 tablet  0  . budesonide-formoterol (SYMBICORT) 160-4.5 MCG/ACT inhaler Inhale 2 puffs into the lungs 2 (two) times daily.      . feeding supplement, RESOURCE BREEZE, (RESOURCE BREEZE) LIQD Take 1 Container by mouth 2 (two) times daily between meals.  30 Container  0  . fluticasone (FLONASE) 50 MCG/ACT nasal spray Place 2 sprays into both nostrils daily.      Marland Kitchen HYDROcodone-acetaminophen (HYCET) 7.5-325 mg/15 ml solution Take 10 mLs by mouth 4 (four) times daily as needed for moderate pain.  120 mL  0  . lisinopril (PRINIVIL,ZESTRIL) 10 MG tablet Take 10 mg by mouth daily.      . metoprolol succinate (TOPROL-XL) 50 MG 24 hr tablet Take 1 tablet (50 mg total) by mouth daily. Take with or immediately following a meal.  30 tablet  3  . Wound Cleansers (RADIAPLEX EX) Apply topically.      Marland Kitchen albuterol (PROVENTIL) (2.5 MG/3ML) 0.083% nebulizer solution Take 3 mLs (2.5 mg total) by nebulization every 6 (six) hours as needed for wheezing.  150 mL  1  .  Alum & Mag Hydroxide-Simeth (MAGIC MOUTHWASH W/LIDOCAINE) SOLN Take 5 mLs by mouth 3 (three) times daily as needed for mouth pain.  480 mL  0  . chlorpheniramine-HYDROcodone (TUSSIONEX PENNKINETIC ER) 10-8 MG/5ML LQCR Take 5 mLs by mouth every 12 (twelve) hours as needed.  60 mL  0  . diltiazem (CARDIZEM CD) 120 MG 24 hr capsule Take 1 capsule (120 mg total) by mouth daily.  30 capsule  0  . predniSONE (DELTASONE) 50 MG tablet Take 1 tablet 13 hours, 7 hours and 1 hour before your  CT scan. Take with food. You will also take Benadryl 50 mg 1 hour before the CT scan.  6 tablet  0  . prochlorperazine (COMPAZINE) 10 MG tablet Take 1 tablet (10 mg total) by mouth every 6 (six) hours as needed for nausea or vomiting.  60 tablet  0  . sucralfate (CARAFATE) 1 G tablet Take 1 tablet (1 g total) by mouth 4 (four) times daily -  with meals and at bedtime. Crush and dissolve in 8 oz of water.  90 tablet  1   No current facility-administered medications for this visit.    SURGICAL HISTORY:  Past Surgical History  Procedure Laterality Date  . Tubal ligation    . Cesarean section    . Video bronchoscopy with endobronchial ultrasound N/A 03/11/2014    Procedure: VIDEO BRONCHOSCOPY WITH ENDOBRONCHIAL ULTRASOUND;  Surgeon: Melrose Nakayama, MD;  Location: [facility];  Service: Thoracic;  Laterality: N/A;  . Mediastinoscopy N/A 03/11/2014    Procedure: MEDIASTINOSCOPY;  Surgeon: Melrose Nakayama, MD;  Location: [facility];  Service: Thoracic;  Laterality: N/A;    REVIEW OF SYSTEMS:  Constitutional: positive for fatigue Eyes: negative Ears, nose, mouth, throat, and face: negative Respiratory: positive for cough Cardiovascular: negative Gastrointestinal: negative Genitourinary:negative Integument/breast: negative Hematologic/lymphatic: negative Musculoskeletal:negative Neurological: negative Behavioral/Psych: negative Endocrine: negative Allergic/Immunologic: negative   PHYSICAL EXAMINATION: General appearance: alert, cooperative and no distress Head: Normocephalic, without obvious abnormality, atraumatic Neck: no adenopathy, no JVD, supple, symmetrical, trachea midline and thyroid not enlarged, symmetric, no tenderness/mass/nodules Lymph nodes: Cervical, supraclavicular, and axillary nodes normal. Resp: wheezes bilaterally Back: symmetric, no curvature. ROM normal. No CVA tenderness. Cardio: regular rate and rhythm, S1, S2 normal, no murmur, click, rub or gallop GI: soft,  non-tender; bowel sounds normal; no masses,  no organomegaly Extremities: extremities normal, atraumatic, no cyanosis or edema Neurologic: Alert and oriented X 3, normal strength and tone. Normal symmetric reflexes. Normal coordination and gait  ECOG PERFORMANCE STATUS: 1 - Symptomatic but completely ambulatory  Blood pressure 119/69, pulse 108, temperature 98.6 F (37 C), temperature source Oral, resp. rate 18, height 4\' 11"  (1.499 m), weight 115 lb 6.4 oz (52.345 kg), SpO2 100.00%.  LABORATORY DATA: Lab Results  Component Value Date   WBC 5.8 05/20/2014   HGB 12.1 05/20/2014   HCT 36.9 05/20/2014   MCV 104.8* 05/20/2014   PLT 271 05/20/2014      Chemistry      Component Value Date/Time   NA 141 05/20/2014 0822   NA 138 03/12/2014 0530   K 3.8 05/20/2014 0822   K 4.3 03/12/2014 0530   CL 99 03/12/2014 0530   CO2 24 05/20/2014 0822   CO2 23 03/12/2014 0530   BUN 8.9 05/20/2014 0822   BUN 12 03/12/2014 0530   CREATININE 0.7 05/20/2014 0822   CREATININE 0.67 03/12/2014 0530   CREATININE 0.56 01/01/2014 1111      Component Value Date/Time   CALCIUM  9.4 05/20/2014 0822   CALCIUM 9.1 03/12/2014 0530   ALKPHOS 113 05/20/2014 0822   ALKPHOS 110 03/11/2014 1002   AST 17 05/20/2014 0822   AST 31 03/11/2014 1002   ALT 15 05/20/2014 0822   ALT 26 03/11/2014 1002   BILITOT 0.57 05/20/2014 0822   BILITOT 0.6 03/11/2014 1002       RADIOGRAPHIC STUDIES: Ct Chest Wo Contrast  04/22/2014   CLINICAL DATA:  Small cell lung cancer diagnosed April 2015, ongoing chemotherapy and radiation therapy. Patient refused IV contrast and is usually pre-medicated with Benadryl for a previous contrast reaction of hives.  EXAM: CT CHEST WITHOUT CONTRAST  TECHNIQUE: Multidetector CT imaging of the chest was performed following the standard protocol without IV contrast.  COMPARISON:  PET-CT 03/25/2014, chest CT 03/05/2014  FINDINGS: Reassessment is suboptimal due to patient refusal of IV contrast.  Right perihilar/subcarinal mass is  decreased in size now measuring approximately 5.5 x 4.5 cm image 26, previously 8.0 x 6.5 cm at the same anatomic level. Trace fluid is noted in the superior pericardial recess. Great vessels are normal in caliber. Heart size is normal. Improved aeration of the right middle and lower lobes is noted, with reduced mass effect upon the right mainstem bronchus by the above-mentioned mass. Linear residual right middle lobe atelectasis is noted. No new pulmonary mass, consolidation, or nodule is identified. No pleural effusion. Central airways are patent, with only minimal attenuation of the right mainstem bronchus compared to the prior exam.  No lytic or sclerotic osseous lesion.  No acute osseous abnormality.  IMPRESSION: Decrease in size of central/right perihilar mass compatible with small cell lung cancer with improved right middle lobe and right lower lobe aeration and reduced mass effect upon the right mainstem bronchus.  Please note on future exams the patient is encouraged to complete premedication if necessary prior to administration of IV contrast for better assessment of the central mass and its relationship to the central mediastinal structures.   Electronically Signed   By: Conchita Paris M.D.   On: 04/22/2014 15:10    ASSESSMENT AND PLAN: This is a very pleasant 53 years old [place] American female recently diagnosed with limited stage small cell lung cancer. She is currently receiving  radiotherapy to the mediastinum under the care of Dr. Pablo Ledger. She is status post 3 cycles of systemic chemotherapy with cisplatin at 60 mg per meter square given on day 1 and etoposide at 120 mg per meter square given on days 1, 2 and 3 with Neulasta support given on day 4. Patient was discussed with and also seen by Dr. Julien Nordmann. Her recent CT scan showed improvement in her disease. Her laboratory data was reviewed. She will continue with cycle #4 of her chemotherapy with no dose modifications today as scheduled. She  will continue weekly labs as scheduled. She'll be scheduled for a restaging CT scan of her chest with contrast. We will premedicate her with 50 mg of prednisone taken 13 hours, 7 hours and 1 hour before the CT scan as well as 50 mg of Benadryl one hour before the CT scan. Patient voiced understanding of these instructions. The results of this restaging CT scan will be discussed at her next office visit in 3 weeks. She'll followup with Dr. Julien Nordmann in 3 weeks for another symptom management visit prior to cycle #5. Overall she's tolerating her chemotherapy relatively well and the plan is for her to complete 6 cycles.  She was advised to call immediately if  she has any concerning symptoms in the interval. The patient voices understanding of current disease status and treatment options and is in agreement with the current care plan.  All questions were answered. The patient knows to call the clinic with any problems, questions or concerns. We can certainly see the patient much sooner if necessary.  Carlton Adam, PA-C    Disclaimer: This note was dictated with voice recognition software. Similar sounding words can inadvertently be transcribed and may not be corrected upon review. Carlton Adam, PA-C 05/20/2014  ADDENDUM: Hematology/Oncology Attending:  I had a face to face encounter with the patient. I recommended his care plan. This is a very pleasant 53 years old [place] American female with limited stage small cell lung cancer currently undergoing systemic chemotherapy with cisplatin and etoposide status post 3 cycles and tolerating her treatment fairly well. The patient completed the course of concurrent radiotherapy under the care of Dr. Pablo Ledger. She denied having any significant complaints today and tolerating her treatment well. We will proceed with cycle #4 today as scheduled. The patient would come back for followup visit in 3 weeks for reevaluation after repeating CT scan of the  chest. She was advised to call immediately if she has any concerning symptoms in the interval.  Disclaimer: This note was dictated with voice recognition software. Similar sounding words can inadvertently be transcribed and may not be corrected upon review.

## 2014-05-20 NOTE — Patient Instructions (Signed)
Take the prednisone with food 13 hours, 7 hours and 1 hour before your CT scan and also take 50 mg of Benadryl one hour before your CT scan Continue with weekly labs as scheduled Followup with Dr. Julien Nordmann in 3 weeks

## 2014-05-20 NOTE — Telephone Encounter (Signed)
Pt scheduled for 6/28 for Md due to holiday ok per MD

## 2014-05-20 NOTE — Telephone Encounter (Signed)
Per staff phone call and POF I have schedueld appts. Advised scheduled moved treatment to start on 6/30 instead of 7/1 due to holiday.  JMW

## 2014-05-21 ENCOUNTER — Ambulatory Visit (HOSPITAL_BASED_OUTPATIENT_CLINIC_OR_DEPARTMENT_OTHER): Payer: BC Managed Care – PPO

## 2014-05-21 VITALS — BP 143/81 | HR 103 | Temp 98.1°F | Resp 18

## 2014-05-21 DIAGNOSIS — C7A1 Malignant poorly differentiated neuroendocrine tumors: Secondary | ICD-10-CM

## 2014-05-21 DIAGNOSIS — Z5111 Encounter for antineoplastic chemotherapy: Secondary | ICD-10-CM

## 2014-05-21 DIAGNOSIS — C3490 Malignant neoplasm of unspecified part of unspecified bronchus or lung: Secondary | ICD-10-CM

## 2014-05-21 MED ORDER — SODIUM CHLORIDE 0.9 % IV SOLN
120.0000 mg/m2 | Freq: Once | INTRAVENOUS | Status: AC
  Administered 2014-05-21: 170 mg via INTRAVENOUS
  Filled 2014-05-21: qty 8.5

## 2014-05-21 MED ORDER — SODIUM CHLORIDE 0.9 % IV SOLN
Freq: Once | INTRAVENOUS | Status: AC
  Administered 2014-05-21: 12:00:00 via INTRAVENOUS

## 2014-05-21 MED ORDER — DEXAMETHASONE SODIUM PHOSPHATE 10 MG/ML IJ SOLN
10.0000 mg | Freq: Once | INTRAMUSCULAR | Status: AC
  Administered 2014-05-21: 10 mg via INTRAVENOUS

## 2014-05-21 MED ORDER — DEXAMETHASONE SODIUM PHOSPHATE 10 MG/ML IJ SOLN
INTRAMUSCULAR | Status: AC
  Filled 2014-05-21: qty 1

## 2014-05-21 NOTE — Patient Instructions (Signed)
[facility] Discharge Instructions for Patients Receiving Chemotherapy  Today you received the following chemotherapy agents: etoposide  To help prevent nausea and vomiting after your treatment, we encourage you to take your nausea medication.  Take it as often as prescribed.     If you develop nausea and vomiting that is not controlled by your nausea medication, call the clinic. If it is after clinic hours your family physician or the after hours number for the clinic or go to the Emergency Department.   BELOW ARE SYMPTOMS THAT SHOULD BE REPORTED IMMEDIATELY:  *FEVER GREATER THAN 100.5 F  *CHILLS WITH OR WITHOUT FEVER  NAUSEA AND VOMITING THAT IS NOT CONTROLLED WITH YOUR NAUSEA MEDICATION  *UNUSUAL SHORTNESS OF BREATH  *UNUSUAL BRUISING OR BLEEDING  TENDERNESS IN MOUTH AND THROAT WITH OR WITHOUT PRESENCE OF ULCERS  *URINARY PROBLEMS  *BOWEL PROBLEMS  UNUSUAL RASH Items with * indicate a potential emergency and should be followed up as soon as possible.  Feel free to call the clinic you have any questions or concerns. The clinic phone number is [phone].   I have been informed and understand all the instructions given to me. I know to contact the clinic, my physician, or go to the Emergency Department if any problems should occur. I do not have any questions at this time, but understand that I may call the clinic during office hours   should I have any questions or need assistance in obtaining follow up care.    __________________________________________  _____________  __________ Signature of Patient or Authorized Representative            Date                   Time    __________________________________________ Nurse's Signature

## 2014-05-21 NOTE — Progress Notes (Signed)
1245 - pt c/o stinging along IV verin.  Good blood return obtained.  NS line run and piggy backed to chemo line to provide dilutation.  Heat pack applied - pt reports significant improvement.

## 2014-05-22 ENCOUNTER — Encounter: Payer: Self-pay | Admitting: Internal Medicine

## 2014-05-22 ENCOUNTER — Ambulatory Visit (HOSPITAL_BASED_OUTPATIENT_CLINIC_OR_DEPARTMENT_OTHER): Payer: BC Managed Care – PPO

## 2014-05-22 VITALS — BP 144/82 | HR 62 | Temp 98.1°F | Resp 18

## 2014-05-22 DIAGNOSIS — Z5111 Encounter for antineoplastic chemotherapy: Secondary | ICD-10-CM

## 2014-05-22 DIAGNOSIS — C3490 Malignant neoplasm of unspecified part of unspecified bronchus or lung: Secondary | ICD-10-CM

## 2014-05-22 DIAGNOSIS — C7A1 Malignant poorly differentiated neuroendocrine tumors: Secondary | ICD-10-CM

## 2014-05-22 MED ORDER — DEXAMETHASONE SODIUM PHOSPHATE 10 MG/ML IJ SOLN
INTRAMUSCULAR | Status: AC
  Filled 2014-05-22: qty 1

## 2014-05-22 MED ORDER — SODIUM CHLORIDE 0.9 % IV SOLN
Freq: Once | INTRAVENOUS | Status: AC
  Administered 2014-05-22: 12:00:00 via INTRAVENOUS

## 2014-05-22 MED ORDER — DEXAMETHASONE SODIUM PHOSPHATE 10 MG/ML IJ SOLN
10.0000 mg | Freq: Once | INTRAMUSCULAR | Status: AC
  Administered 2014-05-22: 10 mg via INTRAVENOUS

## 2014-05-22 MED ORDER — ETOPOSIDE CHEMO INJECTION 1 GM/50ML
120.0000 mg/m2 | Freq: Once | INTRAVENOUS | Status: AC
  Administered 2014-05-22: 170 mg via INTRAVENOUS
  Filled 2014-05-22: qty 8.5

## 2014-05-22 NOTE — Patient Instructions (Signed)
[facility] Discharge Instructions for Patients Receiving Chemotherapy  Today you received the following chemotherapy agents Etoposide (VP 16) To help prevent nausea and vomiting after your treatment, we encourage you to take your nausea medication as prescribed.   If you develop nausea and vomiting that is not controlled by your nausea medication, call the clinic.   BELOW ARE SYMPTOMS THAT SHOULD BE REPORTED IMMEDIATELY:  *FEVER GREATER THAN 100.5 F  *CHILLS WITH OR WITHOUT FEVER  NAUSEA AND VOMITING THAT IS NOT CONTROLLED WITH YOUR NAUSEA MEDICATION  *UNUSUAL SHORTNESS OF BREATH  *UNUSUAL BRUISING OR BLEEDING  TENDERNESS IN MOUTH AND THROAT WITH OR WITHOUT PRESENCE OF ULCERS  *URINARY PROBLEMS  *BOWEL PROBLEMS  UNUSUAL RASH Items with * indicate a potential emergency and should be followed up as soon as possible.  Feel free to call the clinic you have any questions or concerns. The clinic phone number is [phone].

## 2014-05-22 NOTE — Progress Notes (Signed)
Insurance is paying Neulasta 100%

## 2014-05-23 ENCOUNTER — Ambulatory Visit (HOSPITAL_BASED_OUTPATIENT_CLINIC_OR_DEPARTMENT_OTHER): Payer: BC Managed Care – PPO

## 2014-05-23 VITALS — BP 127/81 | HR 90 | Temp 98.1°F | Resp 18

## 2014-05-23 DIAGNOSIS — C3490 Malignant neoplasm of unspecified part of unspecified bronchus or lung: Secondary | ICD-10-CM

## 2014-05-23 DIAGNOSIS — C7A1 Malignant poorly differentiated neuroendocrine tumors: Secondary | ICD-10-CM

## 2014-05-23 DIAGNOSIS — Z5189 Encounter for other specified aftercare: Secondary | ICD-10-CM

## 2014-05-23 MED ORDER — PEGFILGRASTIM INJECTION 6 MG/0.6ML
6.0000 mg | Freq: Once | SUBCUTANEOUS | Status: AC
  Administered 2014-05-23: 6 mg via SUBCUTANEOUS

## 2014-05-27 ENCOUNTER — Other Ambulatory Visit (HOSPITAL_BASED_OUTPATIENT_CLINIC_OR_DEPARTMENT_OTHER): Payer: BC Managed Care – PPO

## 2014-05-27 DIAGNOSIS — C3490 Malignant neoplasm of unspecified part of unspecified bronchus or lung: Secondary | ICD-10-CM

## 2014-05-27 DIAGNOSIS — C7A1 Malignant poorly differentiated neuroendocrine tumors: Secondary | ICD-10-CM

## 2014-05-27 LAB — CBC WITH DIFFERENTIAL/PLATELET
BASO%: 0.3 % (ref 0.0–2.0)
Basophils Absolute: 0 10*3/uL (ref 0.0–0.1)
EOS%: 1.1 % (ref 0.0–7.0)
Eosinophils Absolute: 0.1 10*3/uL (ref 0.0–0.5)
HEMATOCRIT: 29.4 % — AB (ref 34.8–46.6)
HGB: 9.8 g/dL — ABNORMAL LOW (ref 11.6–15.9)
LYMPH#: 0.5 10*3/uL — AB (ref 0.9–3.3)
LYMPH%: 4.1 % — AB (ref 14.0–49.7)
MCH: 34.8 pg — ABNORMAL HIGH (ref 25.1–34.0)
MCHC: 33.3 g/dL (ref 31.5–36.0)
MCV: 104.3 fL — ABNORMAL HIGH (ref 79.5–101.0)
MONO#: 1.4 10*3/uL — AB (ref 0.1–0.9)
MONO%: 10.9 % (ref 0.0–14.0)
NEUT%: 83.6 % — ABNORMAL HIGH (ref 38.4–76.8)
NEUTROS ABS: 10.5 10*3/uL — AB (ref 1.5–6.5)
NRBC: 0 % (ref 0–0)
PLATELETS: 128 10*3/uL — AB (ref 145–400)
RBC: 2.82 10*6/uL — AB (ref 3.70–5.45)
RDW: 22.9 % — ABNORMAL HIGH (ref 11.2–14.5)
WBC: 12.5 10*3/uL — AB (ref 3.9–10.3)

## 2014-05-27 LAB — COMPREHENSIVE METABOLIC PANEL (CC13)
ALBUMIN: 3.6 g/dL (ref 3.5–5.0)
ALT: 7 U/L (ref 0–55)
ANION GAP: 9 meq/L (ref 3–11)
AST: 9 U/L (ref 5–34)
Alkaline Phosphatase: 146 U/L (ref 40–150)
BUN: 13.1 mg/dL (ref 7.0–26.0)
CO2: 28 mEq/L (ref 22–29)
CREATININE: 0.6 mg/dL (ref 0.6–1.1)
Calcium: 9.6 mg/dL (ref 8.4–10.4)
Chloride: 101 mEq/L (ref 98–109)
Glucose: 132 mg/dl (ref 70–140)
POTASSIUM: 3.5 meq/L (ref 3.5–5.1)
SODIUM: 138 meq/L (ref 136–145)
TOTAL PROTEIN: 6.3 g/dL — AB (ref 6.4–8.3)
Total Bilirubin: 0.64 mg/dL (ref 0.20–1.20)

## 2014-05-27 LAB — MAGNESIUM (CC13): Magnesium: 1.8 mg/dl (ref 1.5–2.5)

## 2014-06-02 ENCOUNTER — Telehealth: Payer: Self-pay | Admitting: *Deleted

## 2014-06-02 NOTE — Telephone Encounter (Signed)
I have called and left the patient a message to call the office regarding her 7/3 appt. Need to move appt from 7/3 to 7/4 due to holiday. Ask patient to call the office.  JMW

## 2014-06-03 ENCOUNTER — Telehealth: Payer: Self-pay | Admitting: Internal Medicine

## 2014-06-03 ENCOUNTER — Other Ambulatory Visit: Payer: BC Managed Care – PPO

## 2014-06-03 NOTE — Telephone Encounter (Signed)
returned pt call and s/w pt and r/s appt per pt request....pt aware of new d.t

## 2014-06-05 ENCOUNTER — Other Ambulatory Visit (HOSPITAL_BASED_OUTPATIENT_CLINIC_OR_DEPARTMENT_OTHER): Payer: BC Managed Care – PPO

## 2014-06-05 ENCOUNTER — Telehealth: Payer: Self-pay | Admitting: *Deleted

## 2014-06-05 ENCOUNTER — Ambulatory Visit (HOSPITAL_COMMUNITY)
Admission: RE | Admit: 2014-06-05 | Discharge: 2014-06-05 | Disposition: A | Discharge status: Home / Self Care | Payer: BC Managed Care – PPO | Source: Ambulatory Visit | Attending: Physician Assistant | Admitting: Physician Assistant

## 2014-06-05 DIAGNOSIS — R05 Cough: Secondary | ICD-10-CM | POA: Diagnosis not present

## 2014-06-05 DIAGNOSIS — C3490 Malignant neoplasm of unspecified part of unspecified bronchus or lung: Secondary | ICD-10-CM | POA: Diagnosis present

## 2014-06-05 DIAGNOSIS — C7A1 Malignant poorly differentiated neuroendocrine tumors: Secondary | ICD-10-CM

## 2014-06-05 DIAGNOSIS — R059 Cough, unspecified: Secondary | ICD-10-CM | POA: Diagnosis not present

## 2014-06-05 LAB — COMPREHENSIVE METABOLIC PANEL (CC13)
ALK PHOS: 143 U/L (ref 40–150)
ALT: 15 U/L (ref 0–55)
AST: 15 U/L (ref 5–34)
Albumin: 3.6 g/dL (ref 3.5–5.0)
Anion Gap: 9 mEq/L (ref 3–11)
BILIRUBIN TOTAL: 0.21 mg/dL (ref 0.20–1.20)
BUN: 9.5 mg/dL (ref 7.0–26.0)
CO2: 25 mEq/L (ref 22–29)
Calcium: 9.4 mg/dL (ref 8.4–10.4)
Chloride: 107 mEq/L (ref 98–109)
Creatinine: 0.7 mg/dL (ref 0.6–1.1)
Glucose: 149 mg/dl — ABNORMAL HIGH (ref 70–140)
POTASSIUM: 4.1 meq/L (ref 3.5–5.1)
SODIUM: 141 meq/L (ref 136–145)
TOTAL PROTEIN: 6.5 g/dL (ref 6.4–8.3)

## 2014-06-05 LAB — CBC WITH DIFFERENTIAL/PLATELET
BASO%: 0.2 % (ref 0.0–2.0)
Basophils Absolute: 0 10*3/uL (ref 0.0–0.1)
EOS ABS: 0.2 10*3/uL (ref 0.0–0.5)
EOS%: 1.7 % (ref 0.0–7.0)
HCT: 35.5 % (ref 34.8–46.6)
HGB: 11.2 g/dL — ABNORMAL LOW (ref 11.6–15.9)
LYMPH%: 8 % — AB (ref 14.0–49.7)
MCH: 34.5 pg — AB (ref 25.1–34.0)
MCHC: 31.5 g/dL (ref 31.5–36.0)
MCV: 109.2 fL — ABNORMAL HIGH (ref 79.5–101.0)
MONO#: 0.7 10*3/uL (ref 0.1–0.9)
MONO%: 7.8 % (ref 0.0–14.0)
NEUT%: 82.3 % — ABNORMAL HIGH (ref 38.4–76.8)
NEUTROS ABS: 7.4 10*3/uL — AB (ref 1.5–6.5)
PLATELETS: 167 10*3/uL (ref 145–400)
RBC: 3.25 10*6/uL — AB (ref 3.70–5.45)
RDW: 22.4 % — AB (ref 11.2–14.5)
WBC: 9 10*3/uL (ref 3.9–10.3)
lymph#: 0.7 10*3/uL — ABNORMAL LOW (ref 0.9–3.3)

## 2014-06-05 LAB — MAGNESIUM (CC13): Magnesium: 2.1 mg/dl (ref 1.5–2.5)

## 2014-06-05 MED ORDER — IOHEXOL 300 MG/ML  SOLN
80.0000 mL | Freq: Once | INTRAMUSCULAR | Status: AC | PRN
  Administered 2014-06-05: 80 mL via INTRAVENOUS

## 2014-06-05 NOTE — Telephone Encounter (Signed)
THE REPORT WAS FAXED AND GIVEN TO ADRENA JOHNSON,PA.

## 2014-06-08 ENCOUNTER — Ambulatory Visit (HOSPITAL_BASED_OUTPATIENT_CLINIC_OR_DEPARTMENT_OTHER): Payer: BC Managed Care – PPO | Admitting: Internal Medicine

## 2014-06-08 ENCOUNTER — Encounter: Payer: Self-pay | Admitting: Internal Medicine

## 2014-06-08 ENCOUNTER — Other Ambulatory Visit (HOSPITAL_BASED_OUTPATIENT_CLINIC_OR_DEPARTMENT_OTHER): Payer: BC Managed Care – PPO

## 2014-06-08 ENCOUNTER — Other Ambulatory Visit: Payer: Self-pay | Admitting: *Deleted

## 2014-06-08 VITALS — BP 146/78 | HR 92 | Temp 98.4°F | Resp 18 | Ht 59.0 in | Wt 114.3 lb

## 2014-06-08 DIAGNOSIS — C3491 Malignant neoplasm of unspecified part of right bronchus or lung: Secondary | ICD-10-CM

## 2014-06-08 DIAGNOSIS — C3490 Malignant neoplasm of unspecified part of unspecified bronchus or lung: Secondary | ICD-10-CM

## 2014-06-08 DIAGNOSIS — C7A1 Malignant poorly differentiated neuroendocrine tumors: Secondary | ICD-10-CM

## 2014-06-08 LAB — CBC WITH DIFFERENTIAL/PLATELET
BASO%: 0.3 % (ref 0.0–2.0)
BASOS ABS: 0 10*3/uL (ref 0.0–0.1)
EOS ABS: 0.2 10*3/uL (ref 0.0–0.5)
EOS%: 2.1 % (ref 0.0–7.0)
HEMATOCRIT: 35.5 % (ref 34.8–46.6)
HEMOGLOBIN: 11.2 g/dL — AB (ref 11.6–15.9)
LYMPH#: 0.6 10*3/uL — AB (ref 0.9–3.3)
LYMPH%: 8.4 % — AB (ref 14.0–49.7)
MCH: 34.3 pg — ABNORMAL HIGH (ref 25.1–34.0)
MCHC: 31.5 g/dL (ref 31.5–36.0)
MCV: 108.6 fL — ABNORMAL HIGH (ref 79.5–101.0)
MONO#: 0.7 10*3/uL (ref 0.1–0.9)
MONO%: 8.9 % (ref 0.0–14.0)
NEUT%: 80.3 % — ABNORMAL HIGH (ref 38.4–76.8)
NEUTROS ABS: 6 10*3/uL (ref 1.5–6.5)
PLATELETS: 276 10*3/uL (ref 145–400)
RBC: 3.27 10*6/uL — ABNORMAL LOW (ref 3.70–5.45)
RDW: 21.8 % — ABNORMAL HIGH (ref 11.2–14.5)
WBC: 7.5 10*3/uL (ref 3.9–10.3)
nRBC: 0 % (ref 0–0)

## 2014-06-08 LAB — COMPREHENSIVE METABOLIC PANEL (CC13)
ALBUMIN: 3.8 g/dL (ref 3.5–5.0)
ALT: 10 U/L (ref 0–55)
ANION GAP: 11 meq/L (ref 3–11)
AST: 13 U/L (ref 5–34)
Alkaline Phosphatase: 126 U/L (ref 40–150)
BUN: 7.7 mg/dL (ref 7.0–26.0)
CO2: 24 meq/L (ref 22–29)
CREATININE: 0.7 mg/dL (ref 0.6–1.1)
Calcium: 9.8 mg/dL (ref 8.4–10.4)
Chloride: 105 mEq/L (ref 98–109)
GLUCOSE: 187 mg/dL — AB (ref 70–140)
Potassium: 3.9 mEq/L (ref 3.5–5.1)
Sodium: 140 mEq/L (ref 136–145)
Total Bilirubin: 0.31 mg/dL (ref 0.20–1.20)
Total Protein: 6.7 g/dL (ref 6.4–8.3)

## 2014-06-08 LAB — MAGNESIUM (CC13): Magnesium: 2 mg/dl (ref 1.5–2.5)

## 2014-06-08 NOTE — Progress Notes (Signed)
[facility] [phone]   [phone]  OFFICE PROGRESS NOTE  Pcp Not In System No address on file  DIAGNOSIS: Limited stage small cell lung cancer diagnosed in March of 2015.  PRIOR THERAPY: Single fraction of radiotherapy to the mediastinum under the care of Dr. Pablo Ledger on 03/13/2014.  CURRENT THERAPY: Systemic chemotherapy with cisplatin 60 mg/M2 day 1 and etoposide 120 mg/M2 on days 1, 2 and 3 with Neulasta support on day 4. First dose on 03/18/2014. Status post 4 cycles.   INTERVAL HISTORY: Brittany [name] 53 y.o. female returns to the clinic today for followup visit accompanied by her sister. The patient is still undergoing systemic chemotherapy with cisplatin and etoposide and tolerating her treatment fairly well with no significant adverse effects. She is feeling fine today with no specific complaints. She denied having any significant fever or chills, no nausea or vomiting. The patient denied having any significant chest pain, shortness breath, cough or hemoptysis. She has no weight loss or night sweats. She had repeat CT scan of the chest performed recently and she is here for evaluation and discussion of her scan results.  MEDICAL HISTORY: Past Medical History  Diagnosis Date  . Hypertension   . Allergy   . Asthma   . Anxiety   . Small cell lung cancer 03/16/2014    ALLERGIES:  is allergic to codeine; iodinated diagnostic agents; and sulfa antibiotics.  MEDICATIONS:  Current Outpatient Prescriptions  Medication Sig Dispense Refill  . albuterol (PROVENTIL HFA) 108 (90 BASE) MCG/ACT inhaler Inhale 2 puffs into the lungs every 4 (four) hours as needed for wheezing or shortness of breath.  6.7 each  5  . albuterol (PROVENTIL) (2.5 MG/3ML) 0.083% nebulizer solution Take 3 mLs (2.5 mg total) by nebulization every 6 (six) hours as needed for wheezing.  150 mL  1  . Alum & Mag Hydroxide-Simeth (MAGIC MOUTHWASH W/LIDOCAINE) SOLN Take  5 mLs by mouth 3 (three) times daily as needed for mouth pain.  480 mL  0  . atorvastatin (LIPITOR) 20 MG tablet Take 1 tablet (20 mg total) by mouth daily.  90 tablet  0  . budesonide-formoterol (SYMBICORT) 160-4.5 MCG/ACT inhaler Inhale 2 puffs into the lungs 2 (two) times daily.      Marland Kitchen diltiazem (CARDIZEM CD) 120 MG 24 hr capsule Take 1 capsule (120 mg total) by mouth daily.  30 capsule  0  . feeding supplement, RESOURCE BREEZE, (RESOURCE BREEZE) LIQD Take 1 Container by mouth 2 (two) times daily between meals.  30 Container  0  . fluticasone (FLONASE) 50 MCG/ACT nasal spray Place 2 sprays into both nostrils daily.      Marland Kitchen HYDROcodone-acetaminophen (HYCET) 7.5-325 mg/15 ml solution Take 10 mLs by mouth 4 (four) times daily as needed for moderate pain.  120 mL  0  . lisinopril (PRINIVIL,ZESTRIL) 10 MG tablet Take 10 mg by mouth daily.      . metoprolol succinate (TOPROL-XL) 50 MG 24 hr tablet Take 1 tablet (50 mg total) by mouth daily. Take with or immediately following a meal.  30 tablet  3  . predniSONE (DELTASONE) 50 MG tablet Take 1 tablet 13 hours, 7 hours and 1 hour before your CT scan. Take with food. You will also take Benadryl 50 mg 1 hour before the CT scan.  6 tablet  0  . sucralfate (CARAFATE) 1 G tablet Take 1 tablet (1 g total) by mouth 4 (four) times daily -  with  meals and at bedtime. Crush and dissolve in 8 oz of water.  90 tablet  1  . Wound Cleansers (RADIAPLEX EX) Apply topically.      . chlorpheniramine-HYDROcodone (TUSSIONEX PENNKINETIC ER) 10-8 MG/5ML LQCR Take 5 mLs by mouth every 12 (twelve) hours as needed.  60 mL  0  . prochlorperazine (COMPAZINE) 10 MG tablet Take 1 tablet (10 mg total) by mouth every 6 (six) hours as needed for nausea or vomiting.  60 tablet  0   No current facility-administered medications for this visit.    SURGICAL HISTORY:  Past Surgical History  Procedure Laterality Date  . Tubal ligation    . Cesarean section    . Video bronchoscopy with  endobronchial ultrasound N/A 03/11/2014    Procedure: VIDEO BRONCHOSCOPY WITH ENDOBRONCHIAL ULTRASOUND;  Surgeon: Melrose Nakayama, MD;  Location: [facility];  Service: Thoracic;  Laterality: N/A;  . Mediastinoscopy N/A 03/11/2014    Procedure: MEDIASTINOSCOPY;  Surgeon: Melrose Nakayama, MD;  Location: [facility];  Service: Thoracic;  Laterality: N/A;    REVIEW OF SYSTEMS:  Constitutional: positive for fatigue Eyes: negative Ears, nose, mouth, throat, and face: negative Respiratory: negative Cardiovascular: negative Gastrointestinal: negative Genitourinary:negative Integument/breast: negative Hematologic/lymphatic: negative Musculoskeletal:negative Neurological: negative Behavioral/Psych: negative Endocrine: negative Allergic/Immunologic: negative   PHYSICAL EXAMINATION: General appearance: alert, cooperative and no distress Head: Normocephalic, without obvious abnormality, atraumatic Neck: no adenopathy, no JVD, supple, symmetrical, trachea midline and thyroid not enlarged, symmetric, no tenderness/mass/nodules Lymph nodes: Cervical, supraclavicular, and axillary nodes normal. Resp: wheezes bilaterally Back: symmetric, no curvature. ROM normal. No CVA tenderness. Cardio: regular rate and rhythm, S1, S2 normal, no murmur, click, rub or gallop GI: soft, non-tender; bowel sounds normal; no masses,  no organomegaly Extremities: extremities normal, atraumatic, no cyanosis or edema Neurologic: Alert and oriented X 3, normal strength and tone. Normal symmetric reflexes. Normal coordination and gait  ECOG PERFORMANCE STATUS: 1 - Symptomatic but completely ambulatory  Blood pressure 146/78, pulse 92, temperature 98.4 F (36.9 C), temperature source Oral, resp. rate 18, height 4\' 11"  (1.499 m), weight 114 lb 4.8 oz (51.846 kg).  LABORATORY DATA: Lab Results  Component Value Date   WBC 7.5 06/08/2014   HGB 11.2* 06/08/2014   HCT 35.5 06/08/2014   MCV 108.6* 06/08/2014   PLT 276 06/08/2014       Chemistry      Component Value Date/Time   NA 140 06/08/2014 1004   NA 138 03/12/2014 0530   K 3.9 06/08/2014 1004   K 4.3 03/12/2014 0530   CL 99 03/12/2014 0530   CO2 24 06/08/2014 1004   CO2 23 03/12/2014 0530   BUN 7.7 06/08/2014 1004   BUN 12 03/12/2014 0530   CREATININE 0.7 06/08/2014 1004   CREATININE 0.67 03/12/2014 0530   CREATININE 0.56 01/01/2014 1111      Component Value Date/Time   CALCIUM 9.8 06/08/2014 1004   CALCIUM 9.1 03/12/2014 0530   ALKPHOS 126 06/08/2014 1004   ALKPHOS 110 03/11/2014 1002   AST 13 06/08/2014 1004   AST 31 03/11/2014 1002   ALT 10 06/08/2014 1004   ALT 26 03/11/2014 1002   BILITOT 0.31 06/08/2014 1004   BILITOT 0.6 03/11/2014 1002       RADIOGRAPHIC STUDIES: Ct Chest W Contrast  06/05/2014   CLINICAL DATA:  Lung cancer with cough.  EXAM: CT CHEST WITH CONTRAST  TECHNIQUE: Multidetector CT imaging of the chest was performed during intravenous contrast administration.  CONTRAST:  4mL OMNIPAQUE IOHEXOL 300  MG/ML  SOLN  COMPARISON:  04/22/2014.  FINDINGS: Soft tissue / Mediastinum: No axillary lymphadenopathy. The subcarinal and right infrahilar mass, encasing the bronchus intermedius, has decreased in size. When measuring at the same level today, the lesion measures 5.3 x 2.8 cm compared to 5.5 x 4.5 cm previously. No left hilar lymphadenopathy. Heart size is normal. No pericardial effusion.  Lungs / Pleura: There is some chronic atelectasis or linear scarring in the right middle lobe. New subsegmental atelectasis is noted in the lingula. No focal airspace consolidation or pulmonary edema. No pleural effusion.  Bones: Bone windows reveal no worrisome lytic or sclerotic osseous lesions.  Upper Abdomen:  Normal.  IMPRESSION: Interval decrease in size of the subcarinal and right infrahilar mass. No new or progressive disease.   Electronically Signed   By: Misty Stanley M.D.   On: 06/05/2014 11:50   ASSESSMENT AND PLAN: This is a very pleasant 53 years old [place] American  female recently diagnosed with limited stage small cell lung cancer.  She is currently undergoing systemic chemotherapy with cisplatin and etoposide status post 4 cycles and tolerating her treatment fairly well. She completed a course of concurrent radiotherapy. Her recent CT scan of the chest showed further improvement in her disease especially decrease in the size of the subcarinal and right infrahilar mass. I discussed the scan results and showed the images to the patient and her sister. I recommended for her to proceed with 2 more cycles of systemic chemotherapy with cisplatin and etoposide. She will start cycle #5 today. The patient would come back for followup visit in 3 weeks with the start of cycle #6. She was advised to call immediately if she has any concerning symptoms in the interval. The patient voices understanding of current disease status and treatment options and is in agreement with the current care plan.  All questions were answered. The patient knows to call the clinic with any problems, questions or concerns. We can certainly see the patient much sooner if necessary.  Disclaimer: This note was dictated with voice recognition software. Similar sounding words can inadvertently be transcribed and may not be corrected upon review.

## 2014-06-09 ENCOUNTER — Ambulatory Visit (HOSPITAL_BASED_OUTPATIENT_CLINIC_OR_DEPARTMENT_OTHER): Payer: BC Managed Care – PPO

## 2014-06-09 ENCOUNTER — Telehealth: Payer: Self-pay | Admitting: *Deleted

## 2014-06-09 ENCOUNTER — Telehealth: Payer: Self-pay | Admitting: Internal Medicine

## 2014-06-09 VITALS — BP 136/98 | HR 111 | Temp 98.2°F | Resp 18

## 2014-06-09 DIAGNOSIS — C3490 Malignant neoplasm of unspecified part of unspecified bronchus or lung: Secondary | ICD-10-CM

## 2014-06-09 DIAGNOSIS — Z5111 Encounter for antineoplastic chemotherapy: Secondary | ICD-10-CM

## 2014-06-09 DIAGNOSIS — C7A1 Malignant poorly differentiated neuroendocrine tumors: Secondary | ICD-10-CM

## 2014-06-09 MED ORDER — SODIUM CHLORIDE 0.9 % IV SOLN: 120.0000 mg/m2 | Freq: Once | INTRAVENOUS | Status: DC

## 2014-06-09 MED ORDER — PALONOSETRON HCL INJECTION 0.25 MG/5ML
INTRAVENOUS | Status: AC
  Filled 2014-06-09: qty 5

## 2014-06-09 MED ORDER — DEXAMETHASONE SODIUM PHOSPHATE 10 MG/ML IJ SOLN
10.0000 mg | Freq: Once | INTRAMUSCULAR | Status: AC
  Administered 2014-06-09: 10 mg via INTRAVENOUS

## 2014-06-09 MED ORDER — PALONOSETRON HCL INJECTION 0.25 MG/5ML
0.2500 mg | Freq: Once | INTRAVENOUS | Status: AC
  Administered 2014-06-09: 0.25 mg via INTRAVENOUS

## 2014-06-09 MED ORDER — POTASSIUM CHLORIDE 2 MEQ/ML IV SOLN
Freq: Once | INTRAVENOUS | Status: AC
  Administered 2014-06-09: 10:00:00 via INTRAVENOUS
  Filled 2014-06-09: qty 10

## 2014-06-09 MED ORDER — SODIUM CHLORIDE 0.9 % IV SOLN: Freq: Once | INTRAVENOUS | Status: DC

## 2014-06-09 MED ORDER — SODIUM CHLORIDE 0.9 % IV SOLN
60.0000 mg/m2 | Freq: Once | INTRAVENOUS | Status: AC
  Administered 2014-06-09: 87 mg via INTRAVENOUS
  Filled 2014-06-09: qty 87

## 2014-06-09 MED ORDER — DEXAMETHASONE SODIUM PHOSPHATE 10 MG/ML IJ SOLN
INTRAMUSCULAR | Status: AC
  Filled 2014-06-09: qty 1

## 2014-06-09 MED ORDER — SODIUM CHLORIDE 0.9 % IV SOLN
Freq: Once | INTRAVENOUS | Status: AC
  Administered 2014-06-09: 10:00:00 via INTRAVENOUS

## 2014-06-09 MED ORDER — SODIUM CHLORIDE 0.9 % IV SOLN
150.0000 mg | Freq: Once | INTRAVENOUS | Status: AC
  Administered 2014-06-09: 150 mg via INTRAVENOUS
  Filled 2014-06-09: qty 5

## 2014-06-09 MED ORDER — SODIUM CHLORIDE 0.9 % IV SOLN
120.0000 mg/m2 | Freq: Once | INTRAVENOUS | Status: AC
  Administered 2014-06-09: 170 mg via INTRAVENOUS
  Filled 2014-06-09: qty 8.5

## 2014-06-09 NOTE — Telephone Encounter (Signed)
Per staff message and POF I have scheduled appts. Advised scheduler of appts. JMW  

## 2014-06-09 NOTE — Telephone Encounter (Signed)
, °

## 2014-06-09 NOTE — Patient Instructions (Signed)
[facility] Discharge Instructions for Patients Receiving Chemotherapy  Today you received the following chemotherapy agents Etoposide.  To help prevent nausea and vomiting after your treatment, we encourage you to take your nausea medication as prescribed.   If you develop nausea and vomiting that is not controlled by your nausea medication, call the clinic.   BELOW ARE SYMPTOMS THAT SHOULD BE REPORTED IMMEDIATELY:  *FEVER GREATER THAN 100.5 F  *CHILLS WITH OR WITHOUT FEVER  NAUSEA AND VOMITING THAT IS NOT CONTROLLED WITH YOUR NAUSEA MEDICATION  *UNUSUAL SHORTNESS OF BREATH  *UNUSUAL BRUISING OR BLEEDING  TENDERNESS IN MOUTH AND THROAT WITH OR WITHOUT PRESENCE OF ULCERS  *URINARY PROBLEMS  *BOWEL PROBLEMS  UNUSUAL RASH Items with * indicate a potential emergency and should be followed up as soon as possible.  Feel free to call the clinic you have any questions or concerns. The clinic phone number is [phone].

## 2014-06-10 ENCOUNTER — Ambulatory Visit: Payer: BC Managed Care – PPO | Admitting: Internal Medicine

## 2014-06-10 ENCOUNTER — Other Ambulatory Visit: Payer: BC Managed Care – PPO

## 2014-06-10 ENCOUNTER — Ambulatory Visit (HOSPITAL_BASED_OUTPATIENT_CLINIC_OR_DEPARTMENT_OTHER): Payer: BC Managed Care – PPO

## 2014-06-10 VITALS — BP 155/90 | HR 89 | Temp 97.4°F | Resp 18

## 2014-06-10 DIAGNOSIS — C3490 Malignant neoplasm of unspecified part of unspecified bronchus or lung: Secondary | ICD-10-CM

## 2014-06-10 DIAGNOSIS — C7A1 Malignant poorly differentiated neuroendocrine tumors: Secondary | ICD-10-CM

## 2014-06-10 DIAGNOSIS — Z5111 Encounter for antineoplastic chemotherapy: Secondary | ICD-10-CM

## 2014-06-10 MED ORDER — DEXAMETHASONE SODIUM PHOSPHATE 10 MG/ML IJ SOLN
INTRAMUSCULAR | Status: AC
  Filled 2014-06-10: qty 1

## 2014-06-10 MED ORDER — DEXAMETHASONE SODIUM PHOSPHATE 10 MG/ML IJ SOLN
10.0000 mg | Freq: Once | INTRAMUSCULAR | Status: AC
  Administered 2014-06-10: 10 mg via INTRAVENOUS

## 2014-06-10 MED ORDER — SODIUM CHLORIDE 0.9 % IV SOLN
Freq: Once | INTRAVENOUS | Status: AC
  Administered 2014-06-10: 10:00:00 via INTRAVENOUS

## 2014-06-10 MED ORDER — SODIUM CHLORIDE 0.9 % IV SOLN
120.0000 mg/m2 | Freq: Once | INTRAVENOUS | Status: AC
  Administered 2014-06-10: 170 mg via INTRAVENOUS
  Filled 2014-06-10: qty 8.5

## 2014-06-10 NOTE — Patient Instructions (Signed)
[facility] Discharge Instructions for Patients Receiving Chemotherapy  Today you received the following chemotherapy agents Etoposide.  To help prevent nausea and vomiting after your treatment, we encourage you to take your nausea medication.   If you develop nausea and vomiting that is not controlled by your nausea medication, call the clinic.   BELOW ARE SYMPTOMS THAT SHOULD BE REPORTED IMMEDIATELY:  *FEVER GREATER THAN 100.5 F  *CHILLS WITH OR WITHOUT FEVER  NAUSEA AND VOMITING THAT IS NOT CONTROLLED WITH YOUR NAUSEA MEDICATION  *UNUSUAL SHORTNESS OF BREATH  *UNUSUAL BRUISING OR BLEEDING  TENDERNESS IN MOUTH AND THROAT WITH OR WITHOUT PRESENCE OF ULCERS  *URINARY PROBLEMS  *BOWEL PROBLEMS  UNUSUAL RASH Items with * indicate a potential emergency and should be followed up as soon as possible.  Feel free to call the clinic you have any questions or concerns. The clinic phone number is [phone].

## 2014-06-11 ENCOUNTER — Ambulatory Visit (HOSPITAL_BASED_OUTPATIENT_CLINIC_OR_DEPARTMENT_OTHER): Payer: BC Managed Care – PPO

## 2014-06-11 VITALS — BP 161/78 | HR 81 | Temp 98.1°F | Resp 18

## 2014-06-11 DIAGNOSIS — Z5111 Encounter for antineoplastic chemotherapy: Secondary | ICD-10-CM

## 2014-06-11 DIAGNOSIS — C3490 Malignant neoplasm of unspecified part of unspecified bronchus or lung: Secondary | ICD-10-CM

## 2014-06-11 MED ORDER — SODIUM CHLORIDE 0.9 % IV SOLN
Freq: Once | INTRAVENOUS | Status: AC
  Administered 2014-06-11: 12:00:00 via INTRAVENOUS

## 2014-06-11 MED ORDER — DEXAMETHASONE SODIUM PHOSPHATE 10 MG/ML IJ SOLN
INTRAMUSCULAR | Status: AC
  Filled 2014-06-11: qty 1

## 2014-06-11 MED ORDER — SODIUM CHLORIDE 0.9 % IV SOLN
120.0000 mg/m2 | Freq: Once | INTRAVENOUS | Status: AC
  Administered 2014-06-11: 170 mg via INTRAVENOUS
  Filled 2014-06-11: qty 8.5

## 2014-06-11 MED ORDER — DEXAMETHASONE SODIUM PHOSPHATE 10 MG/ML IJ SOLN
10.0000 mg | Freq: Once | INTRAMUSCULAR | Status: AC
  Administered 2014-06-11: 10 mg via INTRAVENOUS

## 2014-06-12 ENCOUNTER — Ambulatory Visit: Payer: BC Managed Care – PPO

## 2014-06-13 ENCOUNTER — Ambulatory Visit (HOSPITAL_BASED_OUTPATIENT_CLINIC_OR_DEPARTMENT_OTHER): Payer: BC Managed Care – PPO

## 2014-06-13 VITALS — BP 114/78 | HR 100 | Temp 98.9°F

## 2014-06-13 DIAGNOSIS — C3490 Malignant neoplasm of unspecified part of unspecified bronchus or lung: Secondary | ICD-10-CM

## 2014-06-13 DIAGNOSIS — Z5189 Encounter for other specified aftercare: Secondary | ICD-10-CM

## 2014-06-13 DIAGNOSIS — C7A1 Malignant poorly differentiated neuroendocrine tumors: Secondary | ICD-10-CM

## 2014-06-13 MED ORDER — PEGFILGRASTIM INJECTION 6 MG/0.6ML
6.0000 mg | Freq: Once | SUBCUTANEOUS | Status: AC
  Administered 2014-06-13: 6 mg via SUBCUTANEOUS

## 2014-06-13 NOTE — Patient Instructions (Signed)

## 2014-06-15 ENCOUNTER — Ambulatory Visit: Payer: BC Managed Care – PPO

## 2014-06-16 ENCOUNTER — Telehealth: Payer: Self-pay | Admitting: Internal Medicine

## 2014-06-16 NOTE — Telephone Encounter (Signed)
pt called to r/s appt..done...pt aware of new d.t °

## 2014-06-17 ENCOUNTER — Other Ambulatory Visit: Payer: BC Managed Care – PPO

## 2014-06-18 ENCOUNTER — Ambulatory Visit
Admission: RE | Admit: 2014-06-18 | Discharge: 2014-06-18 | Disposition: A | Discharge status: Home / Self Care | Payer: BC Managed Care – PPO | Source: Ambulatory Visit | Attending: Radiation Oncology | Admitting: Radiation Oncology

## 2014-06-18 ENCOUNTER — Other Ambulatory Visit (HOSPITAL_BASED_OUTPATIENT_CLINIC_OR_DEPARTMENT_OTHER): Payer: BC Managed Care – PPO

## 2014-06-18 VITALS — BP 136/77 | HR 101 | Temp 97.7°F | Resp 18 | Wt 116.9 lb

## 2014-06-18 DIAGNOSIS — C3491 Malignant neoplasm of unspecified part of right bronchus or lung: Secondary | ICD-10-CM

## 2014-06-18 DIAGNOSIS — C7A1 Malignant poorly differentiated neuroendocrine tumors: Secondary | ICD-10-CM

## 2014-06-18 DIAGNOSIS — C3490 Malignant neoplasm of unspecified part of unspecified bronchus or lung: Secondary | ICD-10-CM

## 2014-06-18 LAB — CBC WITH DIFFERENTIAL/PLATELET
BASO%: 0.4 % (ref 0.0–2.0)
Basophils Absolute: 0 10*3/uL (ref 0.0–0.1)
EOS ABS: 0.1 10*3/uL (ref 0.0–0.5)
EOS%: 0.4 % (ref 0.0–7.0)
HCT: 29.4 % — ABNORMAL LOW (ref 34.8–46.6)
HGB: 9.5 g/dL — ABNORMAL LOW (ref 11.6–15.9)
LYMPH%: 4.8 % — AB (ref 14.0–49.7)
MCH: 36 pg — ABNORMAL HIGH (ref 25.1–34.0)
MCHC: 32.2 g/dL (ref 31.5–36.0)
MCV: 111.9 fL — ABNORMAL HIGH (ref 79.5–101.0)
MONO#: 1.9 10*3/uL — AB (ref 0.1–0.9)
MONO%: 14.7 % — AB (ref 0.0–14.0)
NEUT%: 79.7 % — ABNORMAL HIGH (ref 38.4–76.8)
NEUTROS ABS: 10.3 10*3/uL — AB (ref 1.5–6.5)
PLATELETS: 121 10*3/uL — AB (ref 145–400)
RBC: 2.62 10*6/uL — ABNORMAL LOW (ref 3.70–5.45)
RDW: 19.6 % — AB (ref 11.2–14.5)
WBC: 12.9 10*3/uL — AB (ref 3.9–10.3)
lymph#: 0.6 10*3/uL — ABNORMAL LOW (ref 0.9–3.3)

## 2014-06-18 LAB — COMPREHENSIVE METABOLIC PANEL (CC13)
ALBUMIN: 3.7 g/dL (ref 3.5–5.0)
ALK PHOS: 154 U/L — AB (ref 40–150)
ALT: 9 U/L (ref 0–55)
AST: 9 U/L (ref 5–34)
Anion Gap: 10 mEq/L (ref 3–11)
BILIRUBIN TOTAL: 0.2 mg/dL (ref 0.20–1.20)
BUN: 13.1 mg/dL (ref 7.0–26.0)
CO2: 25 mEq/L (ref 22–29)
Calcium: 9.7 mg/dL (ref 8.4–10.4)
Chloride: 105 mEq/L (ref 98–109)
Creatinine: 0.6 mg/dL (ref 0.6–1.1)
GLUCOSE: 132 mg/dL (ref 70–140)
Potassium: 3.7 mEq/L (ref 3.5–5.1)
Sodium: 140 mEq/L (ref 136–145)
Total Protein: 6.3 g/dL — ABNORMAL LOW (ref 6.4–8.3)

## 2014-06-18 LAB — MAGNESIUM (CC13): Magnesium: 1.7 mg/dl (ref 1.5–2.5)

## 2014-06-18 NOTE — Progress Notes (Addendum)
Department of Radiation Oncology  Phone:  [phone] Fax:        [phone]   Name: Brittany [name] MRN: [id number]  DOB: 27-Jan-1961  Date: 06/18/2014  Follow Up Visit Note  Diagnosis: Limited stage small cell lung cancer  Summary and Interval since last radiation: 1 month from 52 Gy completed 05/13/14 (additional 5 gy in 1 fx given)  Interval History: Brittany [name] presents today for routine followup.  She has done well with chemotherapy and is excited about her CT scan results. Her back had significant dry desquamation She is continuing to work. She is accompanied by her sister. Her shortness of breath is stable. She has no headaches.  Allergies:  Allergies  Allergen Reactions  . Codeine Nausea And Vomiting  . Iodinated Diagnostic Agents Hives and Rash  . Sulfa Antibiotics Hives and Rash    Medications:  Current Outpatient Prescriptions  Medication Sig Dispense Refill  . albuterol (PROVENTIL HFA) 108 (90 BASE) MCG/ACT inhaler Inhale 2 puffs into the lungs every 4 (four) hours as needed for wheezing or shortness of breath.  6.7 each  5  . albuterol (PROVENTIL) (2.5 MG/3ML) 0.083% nebulizer solution Take 3 mLs (2.5 mg total) by nebulization every 6 (six) hours as needed for wheezing.  150 mL  1  . atorvastatin (LIPITOR) 20 MG tablet Take 1 tablet (20 mg total) by mouth daily.  90 tablet  0  . budesonide-formoterol (SYMBICORT) 160-4.5 MCG/ACT inhaler Inhale 2 puffs into the lungs 2 (two) times daily.      Marland Kitchen diltiazem (CARDIZEM CD) 120 MG 24 hr capsule Take 1 capsule (120 mg total) by mouth daily.  30 capsule  0  . feeding supplement, RESOURCE BREEZE, (RESOURCE BREEZE) LIQD Take 1 Container by mouth 2 (two) times daily between meals.  30 Container  0  . fluticasone (FLONASE) 50 MCG/ACT nasal spray Place 2 sprays into both nostrils daily.      Marland Kitchen lisinopril (PRINIVIL,ZESTRIL) 10 MG tablet Take 10 mg by mouth daily.      . metoprolol succinate (TOPROL-XL) 50 MG 24 hr tablet Take 1  tablet (50 mg total) by mouth daily. Take with or immediately following a meal.  30 tablet  3  . predniSONE (DELTASONE) 50 MG tablet Take 1 tablet 13 hours, 7 hours and 1 hour before your CT scan. Take with food. You will also take Benadryl 50 mg 1 hour before the CT scan.  6 tablet  0  . prochlorperazine (COMPAZINE) 10 MG tablet Take 1 tablet (10 mg total) by mouth every 6 (six) hours as needed for nausea or vomiting.  60 tablet  0  . Wound Cleansers (RADIAPLEX EX) Apply topically.      . Alum & Mag Hydroxide-Simeth (MAGIC MOUTHWASH W/LIDOCAINE) SOLN Take 5 mLs by mouth 3 (three) times daily as needed for mouth pain.  480 mL  0  . HYDROcodone-acetaminophen (HYCET) 7.5-325 mg/15 ml solution Take 10 mLs by mouth 4 (four) times daily as needed for moderate pain.  120 mL  0  . sucralfate (CARAFATE) 1 G tablet Take 1 tablet (1 g total) by mouth 4 (four) times daily -  with meals and at bedtime. Crush and dissolve in 8 oz of water.  90 tablet  1   No current facility-administered medications for this encounter.    Physical Exam:  Filed Vitals:   06/18/14 1604  BP: 136/77  Pulse: 101  Temp: 97.7 F (36.5 C)  Resp: 18  Weight: 116 lb 14.4 oz (  53.025 kg)   n oriented. Some hyperpigmentation over her back.  IMPRESSION: Brittany [name] is a 53 y.o. female status post definitive chemoradiation with a good response to treatment  PLAN:  I spoke to Charletta today about prophylactic cranial radiation. We discussed the survival benefit. We discussed the lack of chemotherapy penetration across the blood-brain barrier. We discussed the making of a mask. We discussed 12 treatments. I will plan on simulating her mid to late August. We hope to finish up by Labor Day.    [name], MD
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
## 2014-06-23 ENCOUNTER — Telehealth: Payer: Self-pay | Admitting: Internal Medicine

## 2014-06-23 NOTE — Telephone Encounter (Signed)
returned pt call and r/s appt per pt request...pt ok and aware

## 2014-06-24 ENCOUNTER — Other Ambulatory Visit: Payer: BC Managed Care – PPO

## 2014-06-25 ENCOUNTER — Other Ambulatory Visit (HOSPITAL_BASED_OUTPATIENT_CLINIC_OR_DEPARTMENT_OTHER): Payer: BC Managed Care – PPO

## 2014-06-25 DIAGNOSIS — C3490 Malignant neoplasm of unspecified part of unspecified bronchus or lung: Secondary | ICD-10-CM

## 2014-06-25 DIAGNOSIS — C7A1 Malignant poorly differentiated neuroendocrine tumors: Secondary | ICD-10-CM

## 2014-06-25 LAB — COMPREHENSIVE METABOLIC PANEL (CC13)
ALT: 12 U/L (ref 0–55)
AST: 14 U/L (ref 5–34)
Albumin: 3.6 g/dL (ref 3.5–5.0)
Alkaline Phosphatase: 164 U/L — ABNORMAL HIGH (ref 40–150)
Anion Gap: 8 mEq/L (ref 3–11)
BILIRUBIN TOTAL: 0.22 mg/dL (ref 0.20–1.20)
BUN: 12.9 mg/dL (ref 7.0–26.0)
CO2: 27 mEq/L (ref 22–29)
CREATININE: 0.8 mg/dL (ref 0.6–1.1)
Calcium: 9.5 mg/dL (ref 8.4–10.4)
Chloride: 106 mEq/L (ref 98–109)
Glucose: 149 mg/dl — ABNORMAL HIGH (ref 70–140)
Potassium: 4 mEq/L (ref 3.5–5.1)
Sodium: 140 mEq/L (ref 136–145)
Total Protein: 6.4 g/dL (ref 6.4–8.3)

## 2014-06-25 LAB — CBC WITH DIFFERENTIAL/PLATELET
BASO%: 0.2 % (ref 0.0–2.0)
BASOS ABS: 0 10*3/uL (ref 0.0–0.1)
EOS%: 0.9 % (ref 0.0–7.0)
Eosinophils Absolute: 0.1 10*3/uL (ref 0.0–0.5)
HEMATOCRIT: 34 % — AB (ref 34.8–46.6)
HEMOGLOBIN: 10.5 g/dL — AB (ref 11.6–15.9)
LYMPH%: 7.2 % — AB (ref 14.0–49.7)
MCH: 34.3 pg — AB (ref 25.1–34.0)
MCHC: 30.9 g/dL — AB (ref 31.5–36.0)
MCV: 111.1 fL — AB (ref 79.5–101.0)
MONO#: 1.1 10*3/uL — ABNORMAL HIGH (ref 0.1–0.9)
MONO%: 7.8 % (ref 0.0–14.0)
NEUT#: 11.7 10*3/uL — ABNORMAL HIGH (ref 1.5–6.5)
NEUT%: 83.9 % — AB (ref 38.4–76.8)
PLATELETS: 144 10*3/uL — AB (ref 145–400)
RBC: 3.06 10*6/uL — ABNORMAL LOW (ref 3.70–5.45)
RDW: 18.9 % — ABNORMAL HIGH (ref 11.2–14.5)
WBC: 13.9 10*3/uL — ABNORMAL HIGH (ref 3.9–10.3)
lymph#: 1 10*3/uL (ref 0.9–3.3)

## 2014-06-25 LAB — MAGNESIUM (CC13): Magnesium: 1.9 mg/dl (ref 1.5–2.5)

## 2014-06-26 ENCOUNTER — Telehealth: Payer: Self-pay | Admitting: *Deleted

## 2014-06-26 NOTE — Telephone Encounter (Signed)
Per pharmacy scheduled appt

## 2014-06-29 ENCOUNTER — Ambulatory Visit (HOSPITAL_BASED_OUTPATIENT_CLINIC_OR_DEPARTMENT_OTHER): Payer: BC Managed Care – PPO

## 2014-06-29 ENCOUNTER — Other Ambulatory Visit (HOSPITAL_BASED_OUTPATIENT_CLINIC_OR_DEPARTMENT_OTHER): Payer: BC Managed Care – PPO

## 2014-06-29 ENCOUNTER — Ambulatory Visit (HOSPITAL_BASED_OUTPATIENT_CLINIC_OR_DEPARTMENT_OTHER): Payer: BC Managed Care – PPO | Admitting: Internal Medicine

## 2014-06-29 ENCOUNTER — Encounter: Payer: Self-pay | Admitting: Internal Medicine

## 2014-06-29 ENCOUNTER — Encounter: Payer: Self-pay | Admitting: Pharmacist

## 2014-06-29 VITALS — BP 169/84 | HR 92

## 2014-06-29 VITALS — BP 143/82 | HR 93 | Temp 98.3°F | Resp 18 | Ht 59.0 in | Wt 117.7 lb

## 2014-06-29 DIAGNOSIS — C3490 Malignant neoplasm of unspecified part of unspecified bronchus or lung: Secondary | ICD-10-CM

## 2014-06-29 DIAGNOSIS — C3492 Malignant neoplasm of unspecified part of left bronchus or lung: Secondary | ICD-10-CM

## 2014-06-29 DIAGNOSIS — Z5111 Encounter for antineoplastic chemotherapy: Secondary | ICD-10-CM

## 2014-06-29 LAB — CBC WITH DIFFERENTIAL/PLATELET
BASO%: 0.2 % (ref 0.0–2.0)
Basophils Absolute: 0 10*3/uL (ref 0.0–0.1)
EOS ABS: 0.1 10*3/uL (ref 0.0–0.5)
EOS%: 1.2 % (ref 0.0–7.0)
HCT: 37.7 % (ref 34.8–46.6)
HEMOGLOBIN: 11.8 g/dL (ref 11.6–15.9)
LYMPH%: 5.7 % — AB (ref 14.0–49.7)
MCH: 34.7 pg — ABNORMAL HIGH (ref 25.1–34.0)
MCHC: 31.3 g/dL — ABNORMAL LOW (ref 31.5–36.0)
MCV: 110.9 fL — ABNORMAL HIGH (ref 79.5–101.0)
MONO#: 1.2 10*3/uL — AB (ref 0.1–0.9)
MONO%: 11.2 % (ref 0.0–14.0)
NEUT%: 81.7 % — ABNORMAL HIGH (ref 38.4–76.8)
NEUTROS ABS: 8.5 10*3/uL — AB (ref 1.5–6.5)
PLATELETS: 187 10*3/uL (ref 145–400)
RBC: 3.4 10*6/uL — ABNORMAL LOW (ref 3.70–5.45)
RDW: 19.2 % — ABNORMAL HIGH (ref 11.2–14.5)
WBC: 10.4 10*3/uL — ABNORMAL HIGH (ref 3.9–10.3)
lymph#: 0.6 10*3/uL — ABNORMAL LOW (ref 0.9–3.3)
nRBC: 1 % — ABNORMAL HIGH (ref 0–0)

## 2014-06-29 LAB — COMPREHENSIVE METABOLIC PANEL (CC13)
ALT: 11 U/L (ref 0–55)
ANION GAP: 17 meq/L — AB (ref 3–11)
AST: 15 U/L (ref 5–34)
Albumin: 3.9 g/dL (ref 3.5–5.0)
Alkaline Phosphatase: 137 U/L (ref 40–150)
BUN: 10.4 mg/dL (ref 7.0–26.0)
CALCIUM: 9.6 mg/dL (ref 8.4–10.4)
CHLORIDE: 106 meq/L (ref 98–109)
CO2: 20 meq/L — AB (ref 22–29)
Creatinine: 0.7 mg/dL (ref 0.6–1.1)
Glucose: 139 mg/dl (ref 70–140)
Potassium: 4 mEq/L (ref 3.5–5.1)
SODIUM: 144 meq/L (ref 136–145)
TOTAL PROTEIN: 7 g/dL (ref 6.4–8.3)
Total Bilirubin: 0.29 mg/dL (ref 0.20–1.20)

## 2014-06-29 LAB — MAGNESIUM (CC13): Magnesium: 2.3 mg/dl (ref 1.5–2.5)

## 2014-06-29 MED ORDER — SODIUM CHLORIDE 0.9 % IV SOLN
150.0000 mg | Freq: Once | INTRAVENOUS | Status: AC
  Administered 2014-06-29: 150 mg via INTRAVENOUS
  Filled 2014-06-29: qty 5

## 2014-06-29 MED ORDER — ETOPOSIDE CHEMO INJECTION 1 GM/50ML
120.0000 mg/m2 | Freq: Once | INTRAVENOUS | Status: AC
  Administered 2014-06-29: 170 mg via INTRAVENOUS
  Filled 2014-06-29: qty 8.5

## 2014-06-29 MED ORDER — DEXAMETHASONE SODIUM PHOSPHATE 20 MG/5ML IJ SOLN
INTRAMUSCULAR | Status: AC
  Filled 2014-06-29: qty 5

## 2014-06-29 MED ORDER — DEXAMETHASONE SODIUM PHOSPHATE 20 MG/5ML IJ SOLN
12.0000 mg | Freq: Once | INTRAMUSCULAR | Status: AC
  Administered 2014-06-29: 12 mg via INTRAVENOUS

## 2014-06-29 MED ORDER — SODIUM CHLORIDE 0.9 % IV SOLN
60.0000 mg/m2 | Freq: Once | INTRAVENOUS | Status: AC
  Administered 2014-06-29: 87 mg via INTRAVENOUS
  Filled 2014-06-29: qty 87

## 2014-06-29 MED ORDER — POTASSIUM CHLORIDE 2 MEQ/ML IV SOLN
Freq: Once | INTRAVENOUS | Status: AC
  Administered 2014-06-29: 10:00:00 via INTRAVENOUS
  Filled 2014-06-29: qty 10

## 2014-06-29 MED ORDER — SODIUM CHLORIDE 0.9 % IV SOLN
Freq: Once | INTRAVENOUS | Status: AC
  Administered 2014-06-29: 10:00:00 via INTRAVENOUS

## 2014-06-29 MED ORDER — PALONOSETRON HCL INJECTION 0.25 MG/5ML
0.2500 mg | Freq: Once | INTRAVENOUS | Status: AC
  Administered 2014-06-29: 0.25 mg via INTRAVENOUS

## 2014-06-29 MED ORDER — PALONOSETRON HCL INJECTION 0.25 MG/5ML
INTRAVENOUS | Status: AC
  Filled 2014-06-29: qty 5

## 2014-06-29 NOTE — Progress Notes (Signed)
Pt voided 150 cc and then 175 cc prior to initiating Cisplatin.  Pt voided another 200 cc, 200 cc, 300 cc and 200 cc after Cisplatin.  Pt c/o Chemo taking longer today than usual.  Pt in tears and states it has never taken this long before.  Cisplatin was not run today concurrently w/ Hydration due RN discretion felt it was too much volume at one time for PIV.  Pt asks if she needs to finish the whole liter of hydration.  Spoke w/ Dr. Julien Nordmann and he instructs for pt to complete the hydration as ordered to help protect her kidneys. Informed pt of this and she agreed to wait.

## 2014-06-29 NOTE — Patient Instructions (Signed)
[facility] Discharge Instructions for Patients Receiving Chemotherapy  Today you received the following chemotherapy agents; Cisplatin and Etoposide.   To help prevent nausea and vomiting after your treatment, we encourage you to take your nausea medication as directed.    If you develop nausea and vomiting that is not controlled by your nausea medication, call the clinic.   BELOW ARE SYMPTOMS THAT SHOULD BE REPORTED IMMEDIATELY:  *FEVER GREATER THAN 100.5 F  *CHILLS WITH OR WITHOUT FEVER  NAUSEA AND VOMITING THAT IS NOT CONTROLLED WITH YOUR NAUSEA MEDICATION  *UNUSUAL SHORTNESS OF BREATH  *UNUSUAL BRUISING OR BLEEDING  TENDERNESS IN MOUTH AND THROAT WITH OR WITHOUT PRESENCE OF ULCERS  *URINARY PROBLEMS  *BOWEL PROBLEMS  UNUSUAL RASH Items with * indicate a potential emergency and should be followed up as soon as possible.  Feel free to call the clinic you have any questions or concerns. The clinic phone number is [phone].

## 2014-06-29 NOTE — Progress Notes (Signed)
[place] [phone]   [phone]  OFFICE PROGRESS NOTE  Pcp Not In System No address on file  DIAGNOSIS: Limited stage small cell lung cancer diagnosed in March of 2015.  PRIOR THERAPY: Single fraction of radiotherapy to the mediastinum under the care of Dr. Pablo Ledger on 03/13/2014.  CURRENT THERAPY: Systemic chemotherapy with cisplatin 60 mg/M2 day 1 and etoposide 120 mg/M2 on days 1, 2 and 3 with Neulasta support on day 4. First dose on 03/18/2014. Status post 5 cycles.   INTERVAL HISTORY: Brittany [name] 53 y.o. female returns to the clinic today for followup visit. She has no issues with her treatment. The patient is still undergoing systemic chemotherapy with cisplatin and etoposide and tolerating her treatment fairly well with no significant adverse effects. She is feeling fine today with no specific complaints. She denied having any significant fever or chills, no nausea or vomiting. The patient denied having any significant chest pain, shortness of breath, cough or hemoptysis. She has no weight loss or night sweats.   MEDICAL HISTORY: Past Medical History  Diagnosis Date  . Hypertension   . Allergy   . Asthma   . Anxiety   . Small cell lung cancer 03/16/2014    ALLERGIES:  is allergic to codeine; iodinated diagnostic agents; and sulfa antibiotics.  MEDICATIONS:  Current Outpatient Prescriptions  Medication Sig Dispense Refill  . albuterol (PROVENTIL) (2.5 MG/3ML) 0.083% nebulizer solution Take 3 mLs (2.5 mg total) by nebulization every 6 (six) hours as needed for wheezing.  150 mL  1  . atorvastatin (LIPITOR) 20 MG tablet Take 1 tablet (20 mg total) by mouth daily.  90 tablet  0  . diltiazem (CARDIZEM CD) 120 MG 24 hr capsule Take 1 capsule (120 mg total) by mouth daily.  30 capsule  0  . feeding supplement, RESOURCE BREEZE, (RESOURCE BREEZE) LIQD Take 1 Container by mouth 2 (two) times daily between meals.  30 Container  0   . fluticasone (FLONASE) 50 MCG/ACT nasal spray Place 2 sprays into both nostrils daily.      Marland Kitchen lisinopril (PRINIVIL,ZESTRIL) 10 MG tablet Take 10 mg by mouth daily.      . metoprolol succinate (TOPROL-XL) 50 MG 24 hr tablet Take 1 tablet (50 mg total) by mouth daily. Take with or immediately following a meal.  30 tablet  3  . Wound Cleansers (RADIAPLEX EX) Apply topically.      Marland Kitchen albuterol (PROVENTIL HFA) 108 (90 BASE) MCG/ACT inhaler Inhale 2 puffs into the lungs every 4 (four) hours as needed for wheezing or shortness of breath.  6.7 each  5  . budesonide-formoterol (SYMBICORT) 160-4.5 MCG/ACT inhaler Inhale 2 puffs into the lungs 2 (two) times daily.      . predniSONE (DELTASONE) 50 MG tablet Take 1 tablet 13 hours, 7 hours and 1 hour before your CT scan. Take with food. You will also take Benadryl 50 mg 1 hour before the CT scan.  6 tablet  0  . prochlorperazine (COMPAZINE) 10 MG tablet Take 1 tablet (10 mg total) by mouth every 6 (six) hours as needed for nausea or vomiting.  60 tablet  0   No current facility-administered medications for this visit.    SURGICAL HISTORY:  Past Surgical History  Procedure Laterality Date  . Tubal ligation    . Cesarean section    . Video bronchoscopy with endobronchial ultrasound N/A 03/11/2014    Procedure: VIDEO BRONCHOSCOPY WITH ENDOBRONCHIAL ULTRASOUND;  Surgeon: Melrose Nakayama, MD;  Location: [facility];  Service: Thoracic;  Laterality: N/A;  . Mediastinoscopy N/A 03/11/2014    Procedure: MEDIASTINOSCOPY;  Surgeon: Melrose Nakayama, MD;  Location: [facility];  Service: Thoracic;  Laterality: N/A;    REVIEW OF SYSTEMS:  Constitutional: positive for fatigue Eyes: negative Ears, nose, mouth, throat, and face: negative Respiratory: negative Cardiovascular: negative Gastrointestinal: negative Genitourinary:negative Integument/breast: negative Hematologic/lymphatic: negative Musculoskeletal:negative Neurological: negative Behavioral/Psych:  negative Endocrine: negative Allergic/Immunologic: negative   PHYSICAL EXAMINATION: General appearance: alert, cooperative and no distress Head: Normocephalic, without obvious abnormality, atraumatic Neck: no adenopathy, no JVD, supple, symmetrical, trachea midline and thyroid not enlarged, symmetric, no tenderness/mass/nodules Lymph nodes: Cervical, supraclavicular, and axillary nodes normal. Resp: wheezes bilaterally Back: symmetric, no curvature. ROM normal. No CVA tenderness. Cardio: regular rate and rhythm, S1, S2 normal, no murmur, click, rub or gallop GI: soft, non-tender; bowel sounds normal; no masses,  no organomegaly Extremities: extremities normal, atraumatic, no cyanosis or edema Neurologic: Alert and oriented X 3, normal strength and tone. Normal symmetric reflexes. Normal coordination and gait  ECOG PERFORMANCE STATUS: 1 - Symptomatic but completely ambulatory  Blood pressure 143/82, pulse 93, temperature 98.3 F (36.8 C), temperature source Oral, resp. rate 18, height 4\' 11"  (1.499 m), weight 117 lb 11.2 oz (53.388 kg), SpO2 100.00%.  LABORATORY DATA: Lab Results  Component Value Date   WBC 10.4* 06/29/2014   HGB 11.8 06/29/2014   HCT 37.7 06/29/2014   MCV 110.9* 06/29/2014   PLT 187 06/29/2014      Chemistry      Component Value Date/Time   NA 140 06/25/2014 1008   NA 138 03/12/2014 0530   K 4.0 06/25/2014 1008   K 4.3 03/12/2014 0530   CL 99 03/12/2014 0530   CO2 27 06/25/2014 1008   CO2 23 03/12/2014 0530   BUN 12.9 06/25/2014 1008   BUN 12 03/12/2014 0530   CREATININE 0.8 06/25/2014 1008   CREATININE 0.67 03/12/2014 0530   CREATININE 0.56 01/01/2014 1111      Component Value Date/Time   CALCIUM 9.5 06/25/2014 1008   CALCIUM 9.1 03/12/2014 0530   ALKPHOS 164* 06/25/2014 1008   ALKPHOS 110 03/11/2014 1002   AST 14 06/25/2014 1008   AST 31 03/11/2014 1002   ALT 12 06/25/2014 1008   ALT 26 03/11/2014 1002   BILITOT 0.22 06/25/2014 1008   BILITOT 0.6 03/11/2014 1002        RADIOGRAPHIC STUDIES:  ASSESSMENT AND PLAN: This is a very pleasant 53 years old [place] American female recently diagnosed with limited stage small cell lung cancer.  She is currently undergoing systemic chemotherapy with cisplatin and etoposide status post 5 cycles and tolerating her treatment fairly well.  I recommended for her to proceed with cycle #6 today as scheduled. She would come back for followup visit in 3 weeks after repeating CT scan of the chest for restaging of her disease. She was advised to call immediately if she has any concerning symptoms in the interval. The patient voices understanding of current disease status and treatment options and is in agreement with the current care plan.  All questions were answered. The patient knows to call the clinic with any problems, questions or concerns. We can certainly see the patient much sooner if necessary.  Disclaimer: This note was dictated with voice recognition software. Similar sounding words can inadvertently be transcribed and may not be corrected upon review.

## 2014-06-29 NOTE — Progress Notes (Signed)
Pt with 250cc hydration fluid left to infuse. Pt adamantly demanding to leave. Pt verbalized understanding if leaving she will be against the advice of Dr. Julien Nordmann. Pt dc'd per her request without finishing hydration fluids by Delle Reining, RN.

## 2014-06-30 ENCOUNTER — Ambulatory Visit (HOSPITAL_BASED_OUTPATIENT_CLINIC_OR_DEPARTMENT_OTHER): Payer: BC Managed Care – PPO

## 2014-06-30 VITALS — BP 144/84 | HR 105 | Temp 97.6°F | Resp 18

## 2014-06-30 DIAGNOSIS — C3490 Malignant neoplasm of unspecified part of unspecified bronchus or lung: Secondary | ICD-10-CM

## 2014-06-30 DIAGNOSIS — Z5111 Encounter for antineoplastic chemotherapy: Secondary | ICD-10-CM

## 2014-06-30 DIAGNOSIS — C7A1 Malignant poorly differentiated neuroendocrine tumors: Secondary | ICD-10-CM

## 2014-06-30 MED ORDER — DEXAMETHASONE SODIUM PHOSPHATE 10 MG/ML IJ SOLN
10.0000 mg | Freq: Once | INTRAMUSCULAR | Status: AC
  Administered 2014-06-30: 10 mg via INTRAVENOUS

## 2014-06-30 MED ORDER — SODIUM CHLORIDE 0.9 % IV SOLN
Freq: Once | INTRAVENOUS | Status: AC
  Administered 2014-06-30: 09:00:00 via INTRAVENOUS

## 2014-06-30 MED ORDER — SODIUM CHLORIDE 0.9 % IV SOLN
120.0000 mg/m2 | Freq: Once | INTRAVENOUS | Status: AC
  Administered 2014-06-30: 170 mg via INTRAVENOUS
  Filled 2014-06-30: qty 8.5

## 2014-06-30 MED ORDER — DEXAMETHASONE SODIUM PHOSPHATE 10 MG/ML IJ SOLN
INTRAMUSCULAR | Status: AC
  Filled 2014-06-30: qty 1

## 2014-06-30 NOTE — Patient Instructions (Signed)
[facility] Discharge Instructions for Patients Receiving Chemotherapy  Today you received the following chemotherapy agents: Etoposide  To help prevent nausea and vomiting after your treatment, we encourage you to take your nausea medication as prescribed by your physician.     If you develop nausea and vomiting that is not controlled by your nausea medication, call the clinic.   BELOW ARE SYMPTOMS THAT SHOULD BE REPORTED IMMEDIATELY:  *FEVER GREATER THAN 100.5 F  *CHILLS WITH OR WITHOUT FEVER  NAUSEA AND VOMITING THAT IS NOT CONTROLLED WITH YOUR NAUSEA MEDICATION  *UNUSUAL SHORTNESS OF BREATH  *UNUSUAL BRUISING OR BLEEDING  TENDERNESS IN MOUTH AND THROAT WITH OR WITHOUT PRESENCE OF ULCERS  *URINARY PROBLEMS  *BOWEL PROBLEMS  UNUSUAL RASH Items with * indicate a potential emergency and should be followed up as soon as possible.  Feel free to call the clinic you have any questions or concerns. The clinic phone number is [phone].

## 2014-07-01 ENCOUNTER — Other Ambulatory Visit: Payer: BC Managed Care – PPO

## 2014-07-01 ENCOUNTER — Ambulatory Visit (HOSPITAL_BASED_OUTPATIENT_CLINIC_OR_DEPARTMENT_OTHER): Payer: BC Managed Care – PPO

## 2014-07-01 VITALS — BP 159/88 | HR 79 | Temp 98.0°F

## 2014-07-01 DIAGNOSIS — Z5111 Encounter for antineoplastic chemotherapy: Secondary | ICD-10-CM

## 2014-07-01 DIAGNOSIS — C3490 Malignant neoplasm of unspecified part of unspecified bronchus or lung: Secondary | ICD-10-CM

## 2014-07-01 MED ORDER — SODIUM CHLORIDE 0.9 % IV SOLN: Freq: Once | INTRAVENOUS | Status: DC

## 2014-07-01 MED ORDER — DEXAMETHASONE SODIUM PHOSPHATE 10 MG/ML IJ SOLN
INTRAMUSCULAR | Status: AC
  Filled 2014-07-01: qty 1

## 2014-07-01 MED ORDER — DEXAMETHASONE SODIUM PHOSPHATE 10 MG/ML IJ SOLN
10.0000 mg | Freq: Once | INTRAMUSCULAR | Status: AC
  Administered 2014-07-01: 10 mg via INTRAVENOUS

## 2014-07-01 MED ORDER — SODIUM CHLORIDE 0.9 % IV SOLN
120.0000 mg/m2 | Freq: Once | INTRAVENOUS | Status: AC
  Administered 2014-07-01: 170 mg via INTRAVENOUS
  Filled 2014-07-01: qty 8.5

## 2014-07-01 NOTE — Patient Instructions (Signed)
[facility] Discharge Instructions for Patients Receiving Chemotherapy  Today you received the following chemotherapy agents: Etoposide.  To help prevent nausea and vomiting after your treatment, we encourage you to take your nausea medication as directed.   If you develop nausea and vomiting that is not controlled by your nausea medication, call the clinic.   BELOW ARE SYMPTOMS THAT SHOULD BE REPORTED IMMEDIATELY:  *FEVER GREATER THAN 100.5 F  *CHILLS WITH OR WITHOUT FEVER  NAUSEA AND VOMITING THAT IS NOT CONTROLLED WITH YOUR NAUSEA MEDICATION  *UNUSUAL SHORTNESS OF BREATH  *UNUSUAL BRUISING OR BLEEDING  TENDERNESS IN MOUTH AND THROAT WITH OR WITHOUT PRESENCE OF ULCERS  *URINARY PROBLEMS  *BOWEL PROBLEMS  UNUSUAL RASH Items with * indicate a potential emergency and should be followed up as soon as possible.  Feel free to call the clinic you have any questions or concerns. The clinic phone number is [phone].

## 2014-07-02 ENCOUNTER — Telehealth: Payer: Self-pay | Admitting: Internal Medicine

## 2014-07-02 ENCOUNTER — Ambulatory Visit (HOSPITAL_BASED_OUTPATIENT_CLINIC_OR_DEPARTMENT_OTHER): Payer: BC Managed Care – PPO

## 2014-07-02 VITALS — BP 135/71 | HR 91 | Temp 98.3°F

## 2014-07-02 DIAGNOSIS — C7A1 Malignant poorly differentiated neuroendocrine tumors: Secondary | ICD-10-CM

## 2014-07-02 DIAGNOSIS — Z5189 Encounter for other specified aftercare: Secondary | ICD-10-CM

## 2014-07-02 DIAGNOSIS — C3490 Malignant neoplasm of unspecified part of unspecified bronchus or lung: Secondary | ICD-10-CM

## 2014-07-02 MED ORDER — PEGFILGRASTIM INJECTION 6 MG/0.6ML
6.0000 mg | Freq: Once | SUBCUTANEOUS | Status: AC
  Administered 2014-07-02: 6 mg via SUBCUTANEOUS
  Filled 2014-07-02: qty 0.6

## 2014-07-02 NOTE — Telephone Encounter (Signed)
s.w. pt and advised on July and Aug appt....pt ok and aware

## 2014-07-08 ENCOUNTER — Other Ambulatory Visit (HOSPITAL_BASED_OUTPATIENT_CLINIC_OR_DEPARTMENT_OTHER): Payer: BC Managed Care – PPO

## 2014-07-08 DIAGNOSIS — C3490 Malignant neoplasm of unspecified part of unspecified bronchus or lung: Secondary | ICD-10-CM

## 2014-07-08 LAB — CBC WITH DIFFERENTIAL/PLATELET
BASO%: 0.5 % (ref 0.0–2.0)
Basophils Absolute: 0.1 10e3/uL (ref 0.0–0.1)
EOS%: 0.5 % (ref 0.0–7.0)
Eosinophils Absolute: 0.1 10e3/uL (ref 0.0–0.5)
HCT: 32.6 % — ABNORMAL LOW (ref 34.8–46.6)
HGB: 10.4 g/dL — ABNORMAL LOW (ref 11.6–15.9)
LYMPH%: 5 % — ABNORMAL LOW (ref 14.0–49.7)
MCH: 35 pg — ABNORMAL HIGH (ref 25.1–34.0)
MCHC: 31.9 g/dL (ref 31.5–36.0)
MCV: 109.6 fL — ABNORMAL HIGH (ref 79.5–101.0)
MONO#: 1.5 10e3/uL — ABNORMAL HIGH (ref 0.1–0.9)
MONO%: 11 % (ref 0.0–14.0)
NEUT#: 11.4 10e3/uL — ABNORMAL HIGH (ref 1.5–6.5)
NEUT%: 83 % — ABNORMAL HIGH (ref 38.4–76.8)
Platelets: 107 10e3/uL — ABNORMAL LOW (ref 145–400)
RBC: 2.97 10e6/uL — ABNORMAL LOW (ref 3.70–5.45)
RDW: 18.6 % — ABNORMAL HIGH (ref 11.2–14.5)
WBC: 13.8 10e3/uL — ABNORMAL HIGH (ref 3.9–10.3)
lymph#: 0.7 10e3/uL — ABNORMAL LOW (ref 0.9–3.3)

## 2014-07-08 LAB — COMPREHENSIVE METABOLIC PANEL (CC13)
ALBUMIN: 3.6 g/dL (ref 3.5–5.0)
ALT: <6 U/L (ref 0–55)
ANION GAP: 8 meq/L (ref 3–11)
AST: 9 U/L (ref 5–34)
Alkaline Phosphatase: 175 U/L — ABNORMAL HIGH (ref 40–150)
BUN: 12.5 mg/dL (ref 7.0–26.0)
CHLORIDE: 105 meq/L (ref 98–109)
CO2: 25 mEq/L (ref 22–29)
Calcium: 9.3 mg/dL (ref 8.4–10.4)
Creatinine: 0.7 mg/dL (ref 0.6–1.1)
GLUCOSE: 228 mg/dL — AB (ref 70–140)
POTASSIUM: 4.2 meq/L (ref 3.5–5.1)
SODIUM: 138 meq/L (ref 136–145)
TOTAL PROTEIN: 6.2 g/dL — AB (ref 6.4–8.3)
Total Bilirubin: 0.2 mg/dL (ref 0.20–1.20)

## 2014-07-08 LAB — MAGNESIUM (CC13): Magnesium: 1.7 mg/dl (ref 1.5–2.5)

## 2014-07-20 ENCOUNTER — Encounter (HOSPITAL_COMMUNITY): Payer: Self-pay

## 2014-07-20 ENCOUNTER — Other Ambulatory Visit: Payer: Self-pay | Admitting: Radiation Oncology

## 2014-07-20 ENCOUNTER — Ambulatory Visit (HOSPITAL_COMMUNITY)
Admission: RE | Admit: 2014-07-20 | Discharge: 2014-07-20 | Disposition: A | Discharge status: Home / Self Care | Payer: BC Managed Care – PPO | Source: Ambulatory Visit | Attending: Internal Medicine | Admitting: Internal Medicine

## 2014-07-20 ENCOUNTER — Other Ambulatory Visit (HOSPITAL_BASED_OUTPATIENT_CLINIC_OR_DEPARTMENT_OTHER): Payer: BC Managed Care – PPO

## 2014-07-20 ENCOUNTER — Other Ambulatory Visit: Payer: BC Managed Care – PPO

## 2014-07-20 DIAGNOSIS — J438 Other emphysema: Secondary | ICD-10-CM | POA: Insufficient documentation

## 2014-07-20 DIAGNOSIS — T66XXXA Radiation sickness, unspecified, initial encounter: Secondary | ICD-10-CM | POA: Diagnosis not present

## 2014-07-20 DIAGNOSIS — E041 Nontoxic single thyroid nodule: Secondary | ICD-10-CM | POA: Diagnosis not present

## 2014-07-20 DIAGNOSIS — C3492 Malignant neoplasm of unspecified part of left bronchus or lung: Secondary | ICD-10-CM

## 2014-07-20 DIAGNOSIS — C3490 Malignant neoplasm of unspecified part of unspecified bronchus or lung: Secondary | ICD-10-CM | POA: Diagnosis present

## 2014-07-20 DIAGNOSIS — Y842 Radiological procedure and radiotherapy as the cause of abnormal reaction of the patient, or of later complication, without mention of misadventure at the time of the procedure: Secondary | ICD-10-CM | POA: Insufficient documentation

## 2014-07-20 DIAGNOSIS — R222 Localized swelling, mass and lump, trunk: Secondary | ICD-10-CM | POA: Insufficient documentation

## 2014-07-20 DIAGNOSIS — J984 Other disorders of lung: Secondary | ICD-10-CM | POA: Diagnosis not present

## 2014-07-20 LAB — COMPREHENSIVE METABOLIC PANEL
ALT: 13 U/L (ref 0–35)
AST: 20 U/L (ref 0–37)
Albumin: 4.1 g/dL (ref 3.5–5.2)
Alkaline Phosphatase: 146 U/L — ABNORMAL HIGH (ref 39–117)
BUN: 12 mg/dL (ref 6–23)
CALCIUM: 9.8 mg/dL (ref 8.4–10.5)
CHLORIDE: 106 meq/L (ref 96–112)
CO2: 22 meq/L (ref 19–32)
CREATININE: 0.58 mg/dL (ref 0.50–1.10)
GLUCOSE: 137 mg/dL — AB (ref 70–99)
Potassium: 4.9 mEq/L (ref 3.5–5.3)
Sodium: 145 mEq/L (ref 135–145)
Total Protein: 7.3 g/dL (ref 6.0–8.3)

## 2014-07-20 LAB — CBC WITH DIFFERENTIAL/PLATELET
BASO%: 0.3 % (ref 0.0–2.0)
Basophils Absolute: 0 10*3/uL (ref 0.0–0.1)
EOS%: 1.9 % (ref 0.0–7.0)
Eosinophils Absolute: 0.1 10*3/uL (ref 0.0–0.5)
HCT: 38.4 % (ref 34.8–46.6)
HGB: 12.2 g/dL (ref 11.6–15.9)
LYMPH%: 7.3 % — ABNORMAL LOW (ref 14.0–49.7)
MCH: 35.1 pg — AB (ref 25.1–34.0)
MCHC: 31.8 g/dL (ref 31.5–36.0)
MCV: 110.3 fL — ABNORMAL HIGH (ref 79.5–101.0)
MONO#: 0.7 10*3/uL (ref 0.1–0.9)
MONO%: 10.5 % (ref 0.0–14.0)
NEUT#: 5 10*3/uL (ref 1.5–6.5)
NEUT%: 80 % — AB (ref 38.4–76.8)
Platelets: 221 10*3/uL (ref 145–400)
RBC: 3.48 10*6/uL — ABNORMAL LOW (ref 3.70–5.45)
RDW: 18.1 % — ABNORMAL HIGH (ref 11.2–14.5)
WBC: 6.3 10*3/uL (ref 3.9–10.3)
lymph#: 0.5 10*3/uL — ABNORMAL LOW (ref 0.9–3.3)

## 2014-07-20 MED ORDER — IOHEXOL 300 MG/ML  SOLN
80.0000 mL | Freq: Once | INTRAMUSCULAR | Status: AC | PRN
  Administered 2014-07-20: 80 mL via INTRAVENOUS

## 2014-07-21 ENCOUNTER — Ambulatory Visit
Admission: RE | Admit: 2014-07-21 | Discharge: 2014-07-21 | Disposition: A | Discharge status: Home / Self Care | Payer: BC Managed Care – PPO | Source: Ambulatory Visit | Attending: Radiation Oncology | Admitting: Radiation Oncology

## 2014-07-21 DIAGNOSIS — C3490 Malignant neoplasm of unspecified part of unspecified bronchus or lung: Secondary | ICD-10-CM | POA: Insufficient documentation

## 2014-07-21 DIAGNOSIS — Z510 Encounter for antineoplastic radiation therapy: Secondary | ICD-10-CM | POA: Insufficient documentation

## 2014-07-21 DIAGNOSIS — C3491 Malignant neoplasm of unspecified part of right bronchus or lung: Secondary | ICD-10-CM

## 2014-07-21 NOTE — Progress Notes (Signed)
[facility]  Radiation Oncology   Simulation and Treatment Planning Note   Name: Brittany [name]    MRN: [id number]  Date: 07/21/2014    DOB: 26-Jan-1961  Status: Outpatient.   DIAGNOSIS: Small cell lung cancer (prophylactic cranial radiation)  CONSENT VERIFIED:yes   SET UP: Patient is setup supine   IMMOBILIZATION: Following immobilization is used:Aquaplast Mask   NARRATIVE:The patient was brought to the [facility]. Identity was confirmed. All relevant records and images related to the planned course of therapy were reviewed. Then, the patient was positioned in a stable reproducible clinical set-up for radiation therapy. CT images were obtained. Marks were placed on the mask. The CT images were loaded into the planning software where the target (brain) and avoidance structures (globes) were contoured. The radiation prescription was entered and confirmed.   TREATMENT PLANNING NOTE:  Treatment planning then occurred.  I have requested : MLC's, isodose plan, basic dose calculation

## 2014-07-24 ENCOUNTER — Ambulatory Visit (HOSPITAL_BASED_OUTPATIENT_CLINIC_OR_DEPARTMENT_OTHER): Payer: BC Managed Care – PPO | Admitting: Physician Assistant

## 2014-07-24 VITALS — BP 159/98 | HR 98 | Temp 98.3°F | Resp 19 | Ht 59.0 in | Wt 120.3 lb

## 2014-07-24 DIAGNOSIS — C7A1 Malignant poorly differentiated neuroendocrine tumors: Secondary | ICD-10-CM

## 2014-07-24 DIAGNOSIS — C3490 Malignant neoplasm of unspecified part of unspecified bronchus or lung: Secondary | ICD-10-CM

## 2014-07-28 ENCOUNTER — Telehealth: Payer: Self-pay | Admitting: Internal Medicine

## 2014-07-28 NOTE — Telephone Encounter (Signed)
S/wpt, gave appts 10/12 8am lab and 10/14 8.30 md appt.

## 2014-07-28 NOTE — Patient Instructions (Signed)
Your CT scan revealed mild decrease in the size of your right lung mass. There is no evidence for any new or progressive disease Proceed with whole brain radiation under the care Dr. Pablo Ledger as scheduled Followup in 2 months with a restaging CT scan of your chest to reevaluate your disease

## 2014-07-28 NOTE — Progress Notes (Addendum)
[facility] [phone]   [phone]  OFFICE PROGRESS NOTE  Pcp Not In System No address on file  DIAGNOSIS: Limited stage small cell lung cancer diagnosed in March of 2015.  PRIOR THERAPY: Single fraction of radiotherapy to the mediastinum under the care of Dr. Pablo Ledger on 03/13/2014.  CURRENT THERAPY: Systemic chemotherapy with cisplatin 60 mg/M2 day 1 and etoposide 120 mg/M2 on days 1, 2 and 3 with Neulasta support on day 4. First dose on 03/18/2014. Status post 6 cycles.   INTERVAL HISTORY: Brittany [name] 53 y.o. female returns to the clinic today for followup visit. She has no issues with her treatment. The patient is status post 6 cycles of systemic chemotherapy with cisplatin and etoposide and tolerated her treatment fairly well with no significant adverse effects. She is feeling fine today with no specific complaints. She denied having any significant fever or chills, no nausea or vomiting. The patient denied having any significant chest pain, shortness of breath, cough or hemoptysis. She has no weight loss or night sweats. She reports that she is to start prophylactic O. brain radiation under the care Dr. Pablo Ledger on 09/03/2014. She recently had a restaging CT scan of the chest with contrast and presents to discuss the results of that study.  MEDICAL HISTORY: Past Medical History  Diagnosis Date  . Hypertension   . Allergy   . Asthma   . Anxiety   . Small cell lung cancer 03/16/2014    ALLERGIES:  is allergic to codeine; iodinated diagnostic agents; and sulfa antibiotics.  MEDICATIONS:  Current Outpatient Prescriptions  Medication Sig Dispense Refill  . atorvastatin (LIPITOR) 20 MG tablet Take 1 tablet (20 mg total) by mouth daily.  90 tablet  0  . feeding supplement, RESOURCE BREEZE, (RESOURCE BREEZE) LIQD Take 1 Container by mouth 2 (two) times daily between meals.  30 Container  0  . lisinopril (PRINIVIL,ZESTRIL) 10 MG  tablet Take 10 mg by mouth daily.      . metoprolol succinate (TOPROL-XL) 50 MG 24 hr tablet Take 1 tablet (50 mg total) by mouth daily. Take with or immediately following a meal.  30 tablet  3  . prochlorperazine (COMPAZINE) 10 MG tablet Take 1 tablet (10 mg total) by mouth every 6 (six) hours as needed for nausea or vomiting.  60 tablet  0  . albuterol (PROVENTIL HFA) 108 (90 BASE) MCG/ACT inhaler Inhale 2 puffs into the lungs every 4 (four) hours as needed for wheezing or shortness of breath.  6.7 each  5  . albuterol (PROVENTIL) (2.5 MG/3ML) 0.083% nebulizer solution Take 3 mLs (2.5 mg total) by nebulization every 6 (six) hours as needed for wheezing.  150 mL  1  . budesonide-formoterol (SYMBICORT) 160-4.5 MCG/ACT inhaler Inhale 2 puffs into the lungs 2 (two) times daily.      Marland Kitchen diltiazem (CARDIZEM CD) 120 MG 24 hr capsule Take 1 capsule (120 mg total) by mouth daily.  30 capsule  0  . fluticasone (FLONASE) 50 MCG/ACT nasal spray Place 2 sprays into both nostrils daily.      . predniSONE (DELTASONE) 50 MG tablet Take 1 tablet 13 hours, 7 hours and 1 hour before your CT scan. Take with food. You will also take Benadryl 50 mg 1 hour before the CT scan.  6 tablet  0  . Wound Cleansers (RADIAPLEX EX) Apply topically.       No current facility-administered medications for this visit.  SURGICAL HISTORY:  Past Surgical History  Procedure Laterality Date  . Tubal ligation    . Cesarean section    . Video bronchoscopy with endobronchial ultrasound N/A 03/11/2014    Procedure: VIDEO BRONCHOSCOPY WITH ENDOBRONCHIAL ULTRASOUND;  Surgeon: Melrose Nakayama, MD;  Location: [facility];  Service: Thoracic;  Laterality: N/A;  . Mediastinoscopy N/A 03/11/2014    Procedure: MEDIASTINOSCOPY;  Surgeon: Melrose Nakayama, MD;  Location: [facility];  Service: Thoracic;  Laterality: N/A;    REVIEW OF SYSTEMS:  Constitutional: positive for fatigue Eyes: negative Ears, nose, mouth, throat, and face:  negative Respiratory: negative Cardiovascular: negative Gastrointestinal: negative Genitourinary:negative Integument/breast: negative Hematologic/lymphatic: negative Musculoskeletal:negative Neurological: negative Behavioral/Psych: negative Endocrine: negative Allergic/Immunologic: negative   PHYSICAL EXAMINATION: General appearance: alert, cooperative and no distress Head: Normocephalic, without obvious abnormality, atraumatic Neck: no adenopathy, no JVD, supple, symmetrical, trachea midline and thyroid not enlarged, symmetric, no tenderness/mass/nodules Lymph nodes: Cervical, supraclavicular, and axillary nodes normal. Resp: wheezes bilaterally Back: symmetric, no curvature. ROM normal. No CVA tenderness. Cardio: regular rate and rhythm, S1, S2 normal, no murmur, click, rub or gallop GI: soft, non-tender; bowel sounds normal; no masses,  no organomegaly Extremities: extremities normal, atraumatic, no cyanosis or edema Neurologic: Alert and oriented X 3, normal strength and tone. Normal symmetric reflexes. Normal coordination and gait  ECOG PERFORMANCE STATUS: 1 - Symptomatic but completely ambulatory  Blood pressure 159/98, pulse 98, temperature 98.3 F (36.8 C), temperature source Oral, resp. rate 19, height 4\' 11"  (1.499 m), weight 120 lb 4.8 oz (54.568 kg).  LABORATORY DATA: Lab Results  Component Value Date   WBC 6.3 07/20/2014   HGB 12.2 07/20/2014   HCT 38.4 07/20/2014   MCV 110.3* 07/20/2014   PLT 221 07/20/2014      Chemistry      Component Value Date/Time   NA 145 07/20/2014 0907   NA 138 07/08/2014 0933   K 4.9 07/20/2014 0907   K 4.2 07/08/2014 0933   CL 106 07/20/2014 0907   CO2 22 07/20/2014 0907   CO2 25 07/08/2014 0933   BUN 12 07/20/2014 0907   BUN 12.5 07/08/2014 0933   CREATININE 0.58 07/20/2014 0907   CREATININE 0.7 07/08/2014 0933   CREATININE 0.56 01/01/2014 1111      Component Value Date/Time   CALCIUM 9.8 07/20/2014 0907   CALCIUM 9.3 07/08/2014 0933    ALKPHOS 146* 07/20/2014 0907   ALKPHOS 175* 07/08/2014 0933   AST 20 07/20/2014 0907   AST 9 07/08/2014 0933   ALT 13 07/20/2014 0907   ALT <6 07/08/2014 0933   BILITOT <0.2* 07/20/2014 0907   BILITOT 0.20 07/08/2014 0933       RADIOGRAPHIC STUDIES: Ct Chest W Contrast  07/20/2014   CLINICAL DATA:  Small cell lung cancer diagnosed 03/2014 and chemotherapy and XRT indwelling  EXAM: CT CHEST WITH CONTRAST  TECHNIQUE: Multidetector CT imaging of the chest was performed during intravenous contrast administration.  CONTRAST:  61mL OMNIPAQUE IOHEXOL 300 MG/ML  SOLN  COMPARISON:  06/05/2014  FINDINGS: Scarring/radiation changes in the medial right middle lobe (series 5/image 34). Lungs are otherwise clear. No suspicious pulmonary nodules. Mild paraseptal emphysematous changes. No pleural effusion or pneumothorax.  Visualized thyroid is mildly nodular (Series 2/image 4).  The heart is normal size.  No pericardial effusion.  Subcarinal/right hilar nodal mass measures approximately 2.7 x 5.1 cm (series 2/image 26), previously 2.7 x 5.5 cm when measured in a similar fashion.  Visualized upper abdomen is unremarkable.  Visualized  osseous structures are within normal limits.  IMPRESSION: 2.7 x 5.1 cm subcarinal/right hilar nodal mass, mildly decreased.  No evidence of new/progressive disease in the chest.   Electronically Signed   By: Julian Hy M.D.   On: 07/20/2014 09:52   ASSESSMENT AND PLAN: This is a very pleasant 53 years old [place] American female recently diagnosed with limited stage small cell lung cancer.  She is currently undergoing systemic chemotherapy with cisplatin and etoposide status post 6 cycles and tolerated her treatment fairly well. The patient was discussed with and also seen by Dr. Julien Nordmann. Her restaging CT scan revealed mild decrease in the 2.7 x 5.1 cm subcarinal/right hilar nodal mass. There was no evidence of new or progressive disease in the chest. These results were discussed with  Brittany [name]. She will be placed on observation and followup in 2 months with another restaging CT scan of her chest with contrast to reevaluate her disease. She is encouraged to proceed with prophylactic whole brain radiation under the care Dr. Pablo Ledger as scheduled.  She was advised to call immediately if she has any concerning symptoms in the interval. The patient voices understanding of current disease status and treatment options and is in agreement with the current care plan.  All questions were answered. The patient knows to call the clinic with any problems, questions or concerns. We can certainly see the patient much sooner if necessary.  Carlton Adam, PA_C  ADDENDUM: Hematology/Oncology Attending: I had a face to face encounter with the patient. I recommended her care plan. This is a very pleasant 53 years old [place] American female with limited stage small cell lung cancer status post 6 cycles of systemic chemotherapy with cisplatin and etoposide. He lost the scan showed significant improvement in her disease. I recommended for the patient to continue on observation for now with repeat CT scan of the chest in 2 months for restaging of her disease. I discussed the scan results with the patient and her sister. I recommended for her to see Dr. Pablo Ledger for evaluation and discussion of prophylactic cranial irradiation. The patient agreed to the current plan. She would come back for followup visit in 2 months. She was advised to call immediately if she has any concerning symptoms in the interval.  Disclaimer: This note was dictated with voice recognition software. Similar sounding words can inadvertently be transcribed and may not be corrected upon review. Eilleen Kempf., MD 07/29/2014

## 2014-08-03 ENCOUNTER — Ambulatory Visit
Admission: RE | Admit: 2014-08-03 | Discharge: 2014-08-03 | Disposition: A | Discharge status: Home / Self Care | Payer: BC Managed Care – PPO | Source: Ambulatory Visit | Attending: Radiation Oncology | Admitting: Radiation Oncology

## 2014-08-03 ENCOUNTER — Ambulatory Visit: Payer: BC Managed Care – PPO | Admitting: Internal Medicine

## 2014-08-03 DIAGNOSIS — Z510 Encounter for antineoplastic radiation therapy: Secondary | ICD-10-CM | POA: Diagnosis not present

## 2014-08-03 DIAGNOSIS — C3491 Malignant neoplasm of unspecified part of right bronchus or lung: Secondary | ICD-10-CM

## 2014-08-03 NOTE — Progress Notes (Signed)
  Radiation Oncology         [phone] ________________________________  Name: Brittany [name] MRN: [id number]  Date: 08/03/2014  DOB: 06-20-1961  Simulation Verification Note  Status: outpatient  NARRATIVE: The patient was brought to the treatment unit and placed in the planned treatment position. The clinical setup was verified. Then port films were obtained and uploaded to the radiation oncology medical record software.  The treatment beams were carefully compared against the planned radiation fields. The position location and shape of the radiation fields was reviewed. The targeted volume of tissue appears appropriately covered by the radiation beams. Organs at risk appear to be excluded as planned.  Based on my personal review, I approved the simulation verification. The patient's treatment will proceed as planned.  ------------------------------------------------  [name], MD

## 2014-08-04 ENCOUNTER — Encounter: Payer: Self-pay | Admitting: Radiation Oncology

## 2014-08-04 ENCOUNTER — Ambulatory Visit
Admission: RE | Admit: 2014-08-04 | Discharge: 2014-08-04 | Disposition: A | Discharge status: Home / Self Care | Payer: BC Managed Care – PPO | Source: Ambulatory Visit | Attending: Radiation Oncology | Admitting: Radiation Oncology

## 2014-08-04 VITALS — BP 123/75 | HR 96 | Temp 98.2°F | Resp 20 | Wt 121.0 lb

## 2014-08-04 DIAGNOSIS — Z510 Encounter for antineoplastic radiation therapy: Secondary | ICD-10-CM | POA: Diagnosis not present

## 2014-08-04 DIAGNOSIS — R918 Other nonspecific abnormal finding of lung field: Secondary | ICD-10-CM

## 2014-08-04 MED ORDER — RADIAPLEXRX EX GEL
Freq: Once | CUTANEOUS | Status: AC
  Administered 2014-08-04: 09:00:00 via TOPICAL

## 2014-08-04 NOTE — Progress Notes (Signed)
Weekly Management Note Current Dose:  4 Gy  Projected Dose:24  Gy   Narrative:  The patient presents for routine under treatment assessment.  CBCT/MVCT images/Port film x-rays were reviewed.  The chart was checked. Doing well. No headaches. No complaints.   Physical Findings: Weight: 121 lb (54.885 kg). Unchanged  Impression:  The patient is tolerating radiation.  Plan:  Continue treatment as planned. RN education performed.

## 2014-08-04 NOTE — Progress Notes (Signed)
Weekly rad txs brain 2/12 completyed, patient education done, radiaplex gel given, rad book, Advertising account executive business card, no c/o pain, nausea, blurred vision, no dizzynes, energy good, no pain 9:02 AM

## 2014-08-05 ENCOUNTER — Ambulatory Visit
Admission: RE | Admit: 2014-08-05 | Discharge: 2014-08-05 | Disposition: A | Discharge status: Home / Self Care | Payer: BC Managed Care – PPO | Source: Ambulatory Visit | Attending: Radiation Oncology | Admitting: Radiation Oncology

## 2014-08-05 DIAGNOSIS — Z510 Encounter for antineoplastic radiation therapy: Secondary | ICD-10-CM | POA: Diagnosis not present

## 2014-08-06 ENCOUNTER — Ambulatory Visit
Admission: RE | Admit: 2014-08-06 | Discharge: 2014-08-06 | Disposition: A | Discharge status: Home / Self Care | Payer: BC Managed Care – PPO | Source: Ambulatory Visit | Attending: Radiation Oncology | Admitting: Radiation Oncology

## 2014-08-06 DIAGNOSIS — Z510 Encounter for antineoplastic radiation therapy: Secondary | ICD-10-CM | POA: Diagnosis not present

## 2014-08-07 ENCOUNTER — Ambulatory Visit
Admission: RE | Admit: 2014-08-07 | Discharge: 2014-08-07 | Disposition: A | Discharge status: Home / Self Care | Payer: BC Managed Care – PPO | Source: Ambulatory Visit | Attending: Radiation Oncology | Admitting: Radiation Oncology

## 2014-08-07 DIAGNOSIS — Z510 Encounter for antineoplastic radiation therapy: Secondary | ICD-10-CM | POA: Diagnosis not present

## 2014-08-10 ENCOUNTER — Ambulatory Visit
Admission: RE | Admit: 2014-08-10 | Discharge: 2014-08-10 | Disposition: A | Discharge status: Home / Self Care | Payer: BC Managed Care – PPO | Source: Ambulatory Visit | Attending: Radiation Oncology | Admitting: Radiation Oncology

## 2014-08-10 DIAGNOSIS — Z510 Encounter for antineoplastic radiation therapy: Secondary | ICD-10-CM | POA: Diagnosis not present

## 2014-08-11 ENCOUNTER — Ambulatory Visit
Admission: RE | Admit: 2014-08-11 | Discharge: 2014-08-11 | Disposition: A | Discharge status: Home / Self Care | Payer: BC Managed Care – PPO | Source: Ambulatory Visit | Attending: Radiation Oncology | Admitting: Radiation Oncology

## 2014-08-11 VITALS — BP 142/83 | HR 97 | Temp 98.2°F | Wt 121.0 lb

## 2014-08-11 DIAGNOSIS — Z510 Encounter for antineoplastic radiation therapy: Secondary | ICD-10-CM | POA: Diagnosis not present

## 2014-08-11 DIAGNOSIS — C3491 Malignant neoplasm of unspecified part of right bronchus or lung: Secondary | ICD-10-CM

## 2014-08-11 NOTE — Progress Notes (Signed)
Weekly Management Note Current Dose:   14 Gy  Projected Dose: 24 Gy   Narrative:  The patient presents for routine under treatment assessment.  CBCT/MVCT images/Port film x-rays were reviewed.  The chart was checked. Doing well. No complaints. No headaches or visual changes.   Physical Findings: Weight: 121 lb (54.885 kg). Unchanged  Impression:  The patient is tolerating radiation.  Plan:  Continue treatment as planned.

## 2014-08-11 NOTE — Progress Notes (Signed)
Patient for weekly assessment of radiation to brain prophylactic ally.Denies pain or nausea or fatigue.Completede 7 of 12 treatments.

## 2014-08-12 ENCOUNTER — Ambulatory Visit
Admission: RE | Admit: 2014-08-12 | Discharge: 2014-08-12 | Disposition: A | Discharge status: Home / Self Care | Payer: BC Managed Care – PPO | Source: Ambulatory Visit | Attending: Radiation Oncology | Admitting: Radiation Oncology

## 2014-08-12 DIAGNOSIS — Z510 Encounter for antineoplastic radiation therapy: Secondary | ICD-10-CM | POA: Diagnosis not present

## 2014-08-13 ENCOUNTER — Ambulatory Visit
Admission: RE | Admit: 2014-08-13 | Discharge: 2014-08-13 | Disposition: A | Discharge status: Home / Self Care | Payer: BC Managed Care – PPO | Source: Ambulatory Visit | Attending: Radiation Oncology | Admitting: Radiation Oncology

## 2014-08-13 DIAGNOSIS — Z510 Encounter for antineoplastic radiation therapy: Secondary | ICD-10-CM | POA: Diagnosis not present

## 2014-08-14 ENCOUNTER — Ambulatory Visit
Admission: RE | Admit: 2014-08-14 | Discharge: 2014-08-14 | Disposition: A | Discharge status: Home / Self Care | Payer: BC Managed Care – PPO | Source: Ambulatory Visit | Attending: Radiation Oncology | Admitting: Radiation Oncology

## 2014-08-14 DIAGNOSIS — Z510 Encounter for antineoplastic radiation therapy: Secondary | ICD-10-CM | POA: Diagnosis not present

## 2014-08-18 ENCOUNTER — Ambulatory Visit
Admission: RE | Admit: 2014-08-18 | Discharge: 2014-08-18 | Disposition: A | Discharge status: Home / Self Care | Payer: BC Managed Care – PPO | Source: Ambulatory Visit | Attending: Radiation Oncology | Admitting: Radiation Oncology

## 2014-08-18 VITALS — BP 140/78 | HR 98 | Temp 98.4°F | Wt 121.1 lb

## 2014-08-18 DIAGNOSIS — C3491 Malignant neoplasm of unspecified part of right bronchus or lung: Secondary | ICD-10-CM

## 2014-08-18 DIAGNOSIS — Z510 Encounter for antineoplastic radiation therapy: Secondary | ICD-10-CM | POA: Diagnosis not present

## 2014-08-18 MED ORDER — BIAFINE EX EMUL
CUTANEOUS | Status: DC | PRN
  Administered 2014-08-18: 10:00:00 via TOPICAL

## 2014-08-18 NOTE — Progress Notes (Signed)
Weekly Management Note Current Dose:  22 Gy  Projected Dose: 24 Gy   Narrative:  The patient presents for routine under treatment assessment.  CBCT/MVCT images/Port film x-rays were reviewed.  The chart was checked. Some headaches in AM. REspond eo compazine. Losing hair/itchy scalp. Still working. Scans scheduled with Dr. Julien Nordmann in October.   Physical Findings: Weight: 121 lb 1.6 oz (54.931 kg). Unchanged. Hair falling out. No skin changes.   Impression:  The patient is tolerating radiation.  Plan:  Continue treatment as planned. Compazine prn for nausea. Call if it gets worse. Follow up in 1 month. Biafene to scalp.

## 2014-08-18 NOTE — Progress Notes (Signed)
Weekly assessment of prophylactic radiation to brain.Denies pain.has some nausea.encouraged to take compazine in the morning prior to treatment.Will give biafine to apply to [employer].es redness and itching.to complete treatment tomorrow.Will give card to schedule follow up in one month.

## 2014-08-19 ENCOUNTER — Encounter: Payer: Self-pay | Admitting: Radiation Oncology

## 2014-08-19 ENCOUNTER — Ambulatory Visit
Admission: RE | Admit: 2014-08-19 | Discharge: 2014-08-19 | Disposition: A | Discharge status: Home / Self Care | Payer: BC Managed Care – PPO | Source: Ambulatory Visit | Attending: Radiation Oncology | Admitting: Radiation Oncology

## 2014-08-19 DIAGNOSIS — Z510 Encounter for antineoplastic radiation therapy: Secondary | ICD-10-CM | POA: Diagnosis not present

## 2014-08-19 NOTE — Addendum Note (Signed)
Encounter addended by: [name], MD on: 08/19/2014 12:54 PM<BR>     Documentation filed: Notes Section

## 2014-08-19 NOTE — Progress Notes (Signed)
  Radiation Oncology         [phone]) [phone] ________________________________  Name: Brittany [name] MRN: [id number]  Date: 08/19/2014  DOB: [birth year]  End of Treatment Note  Diagnosis:  Prophylactic cranial radiation for limited stage small cell lung cancer     Indication for treatment:  Curative       Radiation treatment dates:   08/03/2014-08/19/2014  Site/dose:   Whole brain to 24 Gy in 12 fractions at 2 Gy per fraction  Beams/energy:   Opposed obliques with 6 MV photons  Narrative: The patient tolerated radiation treatment relatively well.   She had minimal skin toxicity and no skin irritation.   Plan: The patient has completed radiation treatment. The patient will return to radiation oncology clinic for routine followup in one month. I advised them to call or return sooner if they have any questions or concerns related to their recovery or treatment.  ------------------------------------------------  [name], MD

## 2014-09-21 ENCOUNTER — Encounter: Payer: Self-pay | Admitting: Radiation Oncology

## 2014-09-21 ENCOUNTER — Ambulatory Visit (HOSPITAL_COMMUNITY): Payer: BC Managed Care – PPO

## 2014-09-21 ENCOUNTER — Other Ambulatory Visit: Payer: BC Managed Care – PPO

## 2014-09-22 ENCOUNTER — Encounter (HOSPITAL_COMMUNITY): Payer: Self-pay

## 2014-09-22 ENCOUNTER — Other Ambulatory Visit (HOSPITAL_BASED_OUTPATIENT_CLINIC_OR_DEPARTMENT_OTHER): Payer: BC Managed Care – PPO

## 2014-09-22 ENCOUNTER — Ambulatory Visit (HOSPITAL_COMMUNITY)
Admission: RE | Admit: 2014-09-22 | Discharge: 2014-09-22 | Disposition: A | Discharge status: Home / Self Care | Payer: BC Managed Care – PPO | Source: Ambulatory Visit | Attending: Diagnostic Radiology | Admitting: Diagnostic Radiology

## 2014-09-22 DIAGNOSIS — C3490 Malignant neoplasm of unspecified part of unspecified bronchus or lung: Secondary | ICD-10-CM | POA: Diagnosis not present

## 2014-09-22 DIAGNOSIS — Z923 Personal history of irradiation: Secondary | ICD-10-CM | POA: Diagnosis not present

## 2014-09-22 DIAGNOSIS — Z9221 Personal history of antineoplastic chemotherapy: Secondary | ICD-10-CM | POA: Insufficient documentation

## 2014-09-22 LAB — CBC WITH DIFFERENTIAL/PLATELET
BASO%: 0.5 % (ref 0.0–2.0)
Basophils Absolute: 0 10*3/uL (ref 0.0–0.1)
EOS ABS: 0.1 10*3/uL (ref 0.0–0.5)
EOS%: 2.4 % (ref 0.0–7.0)
HEMATOCRIT: 55.8 % — AB (ref 34.8–46.6)
HEMOGLOBIN: 17.5 g/dL — AB (ref 11.6–15.9)
LYMPH#: 0.5 10*3/uL — AB (ref 0.9–3.3)
LYMPH%: 11.1 % — AB (ref 14.0–49.7)
MCH: 32.6 pg (ref 25.1–34.0)
MCHC: 31.4 g/dL — ABNORMAL LOW (ref 31.5–36.0)
MCV: 104 fL — ABNORMAL HIGH (ref 79.5–101.0)
MONO#: 0.5 10*3/uL (ref 0.1–0.9)
MONO%: 9.5 % (ref 0.0–14.0)
NEUT%: 76.5 % (ref 38.4–76.8)
NEUTROS ABS: 3.7 10*3/uL (ref 1.5–6.5)
PLATELETS: 160 10*3/uL (ref 145–400)
RBC: 5.37 10*6/uL (ref 3.70–5.45)
RDW: 19.9 % — ABNORMAL HIGH (ref 11.2–14.5)
WBC: 4.9 10*3/uL (ref 3.9–10.3)

## 2014-09-22 LAB — COMPREHENSIVE METABOLIC PANEL (CC13)
ALT: 19 U/L (ref 0–55)
ANION GAP: 12 meq/L — AB (ref 3–11)
AST: 16 U/L (ref 5–34)
Albumin: 4 g/dL (ref 3.5–5.0)
Alkaline Phosphatase: 97 U/L (ref 40–150)
BILIRUBIN TOTAL: 0.52 mg/dL (ref 0.20–1.20)
BUN: 11.6 mg/dL (ref 7.0–26.0)
CHLORIDE: 105 meq/L (ref 98–109)
CO2: 26 meq/L (ref 22–29)
CREATININE: 0.9 mg/dL (ref 0.6–1.1)
Calcium: 10.1 mg/dL (ref 8.4–10.4)
Glucose: 113 mg/dl (ref 70–140)
Potassium: 3.8 mEq/L (ref 3.5–5.1)
Sodium: 142 mEq/L (ref 136–145)
TOTAL PROTEIN: 7.1 g/dL (ref 6.4–8.3)

## 2014-09-22 LAB — MAGNESIUM (CC13): MAGNESIUM: 2.4 mg/dL (ref 1.5–2.5)

## 2014-09-22 MED ORDER — IOHEXOL 300 MG/ML  SOLN
80.0000 mL | Freq: Once | INTRAMUSCULAR | Status: AC | PRN
  Administered 2014-09-22: 80 mL via INTRAVENOUS

## 2014-09-23 ENCOUNTER — Telehealth: Payer: Self-pay | Admitting: Internal Medicine

## 2014-09-23 ENCOUNTER — Ambulatory Visit (HOSPITAL_BASED_OUTPATIENT_CLINIC_OR_DEPARTMENT_OTHER): Payer: BC Managed Care – PPO | Admitting: Internal Medicine

## 2014-09-23 ENCOUNTER — Encounter: Payer: Self-pay | Admitting: Internal Medicine

## 2014-09-23 VITALS — BP 147/95 | HR 60 | Temp 98.4°F | Resp 18 | Ht 59.0 in | Wt 118.0 lb

## 2014-09-23 DIAGNOSIS — C3490 Malignant neoplasm of unspecified part of unspecified bronchus or lung: Secondary | ICD-10-CM

## 2014-09-23 NOTE — Progress Notes (Signed)
[place] [phone]   Fax:(336) [phone]  OFFICE PROGRESS NOTE  Pcp Not In System No address on file  DIAGNOSIS: Limited stage small cell lung cancer diagnosed in March of 2015.  PRIOR THERAPY:  1) Single fraction of radiotherapy to the mediastinum under the care of Dr. Pablo Ledger on 03/13/2014. 2) Systemic chemotherapy with cisplatin 60 mg/M2 day 1 and etoposide 120 mg/M2 on days 1, 2 and 3 with Neulasta support on day 4. First dose on 03/18/2014. Status post 6 cycles. Last dose was given 06/29/2014. 3) prophylactic cranial irradiation under the care of Dr. Pablo Ledger  CURRENT THERAPY: Observation.  INTERVAL HISTORY: Charnetta Wulff 53 y.o. female returns to the clinic today for followup visit accompanied by her sister. She tolerated the prophylactic cranial irradiation well. She has been observation since July 2015. She is feeling fine today with no specific complaints. She denied having any significant fever or chills, no nausea or vomiting. The patient denied having any significant chest pain, shortness of breath, cough or hemoptysis. She has no weight loss or night sweats. She had repeat CT scan of the chest performed recently and she is here for evaluation and discussion of her scan results.  MEDICAL HISTORY: Past Medical History  Diagnosis Date  . Hypertension   . Allergy   . Asthma   . Anxiety   . Small cell lung cancer 03/16/2014  . Prophylactic measure 08/03/14-08/19/14    Prophyl. cranial radiation 24 Gy    ALLERGIES:  is allergic to codeine; iodinated diagnostic agents; and sulfa antibiotics.  MEDICATIONS:  Current Outpatient Prescriptions  Medication Sig Dispense Refill  . albuterol (PROVENTIL HFA) 108 (90 BASE) MCG/ACT inhaler Inhale 2 puffs into the lungs every 4 (four) hours as needed for wheezing or shortness of breath.  6.7 each  5  . albuterol (PROVENTIL) (2.5 MG/3ML) 0.083% nebulizer solution Take 3 mLs (2.5 mg total) by  nebulization every 6 (six) hours as needed for wheezing.  150 mL  1  . atorvastatin (LIPITOR) 20 MG tablet Take 1 tablet (20 mg total) by mouth daily.  90 tablet  0  . budesonide-formoterol (SYMBICORT) 160-4.5 MCG/ACT inhaler Inhale 2 puffs into the lungs 2 (two) times daily.      Marland Kitchen diltiazem (CARDIZEM CD) 120 MG 24 hr capsule Take 1 capsule (120 mg total) by mouth daily.  30 capsule  0  . emollient (BIAFINE) cream Apply topically as needed.      . feeding supplement, RESOURCE BREEZE, (RESOURCE BREEZE) LIQD Take 1 Container by mouth 2 (two) times daily between meals.  30 Container  0  . fluticasone (FLONASE) 50 MCG/ACT nasal spray Place 2 sprays into both nostrils daily.      Marland Kitchen lisinopril (PRINIVIL,ZESTRIL) 10 MG tablet Take 10 mg by mouth daily.      . metoprolol succinate (TOPROL-XL) 50 MG 24 hr tablet Take 1 tablet (50 mg total) by mouth daily. Take with or immediately following a meal.  30 tablet  3  . predniSONE (DELTASONE) 50 MG tablet Take 1 tablet 13 hours, 7 hours and 1 hour before your CT scan. Take with food. You will also take Benadryl 50 mg 1 hour before the CT scan.  6 tablet  0  . prochlorperazine (COMPAZINE) 10 MG tablet Take 1 tablet (10 mg total) by mouth every 6 (six) hours as needed for nausea or vomiting.  60 tablet  0  . Wound Cleansers (RADIAPLEX EX) Apply topically.  No current facility-administered medications for this visit.    SURGICAL HISTORY:  Past Surgical History  Procedure Laterality Date  . Tubal ligation    . Cesarean section    . Video bronchoscopy with endobronchial ultrasound N/A 03/11/2014    Procedure: VIDEO BRONCHOSCOPY WITH ENDOBRONCHIAL ULTRASOUND;  Surgeon: Melrose Nakayama, MD;  Location: [facility];  Service: Thoracic;  Laterality: N/A;  . Mediastinoscopy N/A 03/11/2014    Procedure: MEDIASTINOSCOPY;  Surgeon: Melrose Nakayama, MD;  Location: [facility];  Service: Thoracic;  Laterality: N/A;    REVIEW OF SYSTEMS:  Constitutional: positive for  fatigue Eyes: negative Ears, nose, mouth, throat, and face: negative Respiratory: negative Cardiovascular: negative Gastrointestinal: negative Genitourinary:negative Integument/breast: negative Hematologic/lymphatic: negative Musculoskeletal:negative Neurological: negative Behavioral/Psych: negative Endocrine: negative Allergic/Immunologic: negative   PHYSICAL EXAMINATION: General appearance: alert, cooperative and no distress Head: Normocephalic, without obvious abnormality, atraumatic Neck: no adenopathy, no JVD, supple, symmetrical, trachea midline and thyroid not enlarged, symmetric, no tenderness/mass/nodules Lymph nodes: Cervical, supraclavicular, and axillary nodes normal. Resp: wheezes bilaterally Back: symmetric, no curvature. ROM normal. No CVA tenderness. Cardio: regular rate and rhythm, S1, S2 normal, no murmur, click, rub or gallop GI: soft, non-tender; bowel sounds normal; no masses,  no organomegaly Extremities: extremities normal, atraumatic, no cyanosis or edema Neurologic: Alert and oriented X 3, normal strength and tone. Normal symmetric reflexes. Normal coordination and gait  ECOG PERFORMANCE STATUS: 1 - Symptomatic but completely ambulatory  There were no vitals taken for this visit.  LABORATORY DATA: Lab Results  Component Value Date   WBC 4.9 09/22/2014   HGB 17.5* 09/22/2014   HCT 55.8* 09/22/2014   MCV 104.0* 09/22/2014   PLT 160 09/22/2014      Chemistry      Component Value Date/Time   NA 142 09/22/2014 1336   NA 145 07/20/2014 0907   K 3.8 09/22/2014 1336   K 4.9 07/20/2014 0907   CL 106 07/20/2014 0907   CO2 26 09/22/2014 1336   CO2 22 07/20/2014 0907   BUN 11.6 09/22/2014 1336   BUN 12 07/20/2014 0907   CREATININE 0.9 09/22/2014 1336   CREATININE 0.58 07/20/2014 0907   CREATININE 0.56 01/01/2014 1111      Component Value Date/Time   CALCIUM 10.1 09/22/2014 1336   CALCIUM 9.8 07/20/2014 0907   ALKPHOS 97 09/22/2014 1336   ALKPHOS 146*  07/20/2014 0907   AST 16 09/22/2014 1336   AST 20 07/20/2014 0907   ALT 19 09/22/2014 1336   ALT 13 07/20/2014 0907   BILITOT 0.52 09/22/2014 1336   BILITOT <0.2* 07/20/2014 0907       RADIOGRAPHIC STUDIES: Ct Chest W Contrast  09/22/2014   CLINICAL DATA:  Small cell lung cancer, unspecified laterality C34.90 (ICD-10-CM). Lung cancer diagnosed 4/15 with last chemotherapy 8/15. Radiation therapy completed earlier this year. Restaging. Subsequent encounter.  EXAM: CT CHEST WITH CONTRAST  TECHNIQUE: Multidetector CT imaging of the chest was performed during intravenous contrast administration.  CONTRAST:  79mL OMNIPAQUE IOHEXOL 300 MG/ML  SOLN  COMPARISON:  07/20/2014 and 06/05/2014  FINDINGS: Lungs/Pleura:  No pulmonary nodule or mass.  No pleural fluid.  Heart/Mediastinum: No supraclavicular adenopathy. Mild cardiomegaly, without pericardial effusion. No central pulmonary embolism, on this non-dedicated study.  Soft tissue thickening involving the inferior mediastinum and right infrahilar region is further improved. Measures on the order of 4.3 x 2.0 cm on image 26 of series 2. Compare 5.1 x 2.7 cm on the prior exam. No separate well-defined adenopathy identified.  Upper Abdomen: Normal imaged portions of the liver, spleen, stomach, pancreas, kidneys, adrenal glands.  Bones/Musculoskeletal:  No acute osseous abnormality.  IMPRESSION: 1. Further improvement in soft tissue fullness within the low mediastinum. This is primarily favored to represent radiation change. Cannot exclude small volume residual tumor/adenopathy. 2. No new sites of disease identified.   Electronically Signed   By: Abigail Miyamoto M.D.   On: 09/22/2014 16:45   ASSESSMENT AND PLAN: This is a very pleasant 53 years old [place] American female recently diagnosed with limited stage small cell lung cancer.  She completed systemic chemotherapy with cisplatin and etoposide status post 6 cycles and tolerating her treatment fairly well. This was  followed by prophylactic cranial irradiation. The patient is doing fine and she has no evidence for disease progression on his recent scan. I discussed the scan results with the patient and her sister. I recommended for her to continue on observation with repeat CT scan of the chest in 3 months. She was advised to call immediately if she has any concerning symptoms in the interval. The patient voices understanding of current disease status and treatment options and is in agreement with the current care plan.  All questions were answered. The patient knows to call the clinic with any problems, questions or concerns. We can certainly see the patient much sooner if necessary.  Disclaimer: This note was dictated with voice recognition software. Similar sounding words can inadvertently be transcribed and may not be corrected upon review.

## 2014-09-24 ENCOUNTER — Ambulatory Visit
Admission: RE | Admit: 2014-09-24 | Discharge: 2014-09-24 | Disposition: A | Discharge status: Home / Self Care | Payer: BC Managed Care – PPO | Source: Ambulatory Visit | Attending: Radiation Oncology | Admitting: Radiation Oncology

## 2014-09-24 VITALS — BP 154/98 | HR 96 | Temp 98.2°F | Wt 119.1 lb

## 2014-09-24 DIAGNOSIS — C3491 Malignant neoplasm of unspecified part of right bronchus or lung: Secondary | ICD-10-CM

## 2014-09-24 HISTORY — DX: Encounter for prophylactic measures, unspecified: Z29.9

## 2014-09-24 NOTE — Progress Notes (Signed)
   Department of Radiation Oncology  Phone:  [phone] Fax:        [phone]   Name: Brittany [name] MRN: [id number]  DOB: [birth year]  Date: 09/24/2014  Follow Up Visit Note  Diagnosis:    ICD-9-CM ICD-10-CM  1. Small cell lung cancer, right 162.9 C34.91   Summary and Interval since last radiation: 1 month from PCI to whole brain completed 08/19/14  Interval History: Brittany [name] presents today for routine followup.  She is doing well. No headaches or vision changes. Some itching in her ears. Had CT earlier this week that showed a complete response in the chest with radiaiton change. She has another scn scheduled for January.   Physical Exam:  Filed Vitals:   09/24/14 0830  BP: 154/98  Pulse: 96  Temp: 98.2 F (36.8 C)  Weight: 119 lb 1.6 oz (54.023 kg)  SpO2: 96%   Dry skin in ears. Alert and oriented.   IMPRESSION: Brittany [name] is a 53 y.o. female s/p definitive chemoRT followed by PCI for SCLC now with resolving effects fo treatment  PLAN:  She is doing well. We discussed the need for follow up every 4-6 months which she has scheduled.  We discussed the need for sun protection in the treated area.  We discussed the possibility of impacted wax in her ears which may require ENT intervention or just warm water soaks. She can always call me with questions.  I will follow up with her on an as needed basis.     [name], MD

## 2014-09-24 NOTE — Progress Notes (Signed)
One month follow up completion of radiation to whole brain prophylactic.denies pain, nausea or any other problems.Scheduled for chest ct in January 2016.Follow up with Dr.Mohamed on 09/23/14.Skin looks great in treatment field.

## 2014-12-23 ENCOUNTER — Encounter (HOSPITAL_COMMUNITY): Payer: Self-pay

## 2014-12-23 ENCOUNTER — Ambulatory Visit (HOSPITAL_COMMUNITY)
Admission: RE | Admit: 2014-12-23 | Discharge: 2014-12-23 | Disposition: A | Discharge status: Home / Self Care | Payer: BLUE CROSS/BLUE SHIELD | Source: Ambulatory Visit | Attending: Internal Medicine | Admitting: Internal Medicine

## 2014-12-23 ENCOUNTER — Other Ambulatory Visit (HOSPITAL_BASED_OUTPATIENT_CLINIC_OR_DEPARTMENT_OTHER): Payer: BLUE CROSS/BLUE SHIELD

## 2014-12-23 DIAGNOSIS — C3490 Malignant neoplasm of unspecified part of unspecified bronchus or lung: Secondary | ICD-10-CM

## 2014-12-23 DIAGNOSIS — C7A1 Malignant poorly differentiated neuroendocrine tumors: Secondary | ICD-10-CM

## 2014-12-23 DIAGNOSIS — Z9221 Personal history of antineoplastic chemotherapy: Secondary | ICD-10-CM | POA: Diagnosis not present

## 2014-12-23 DIAGNOSIS — R05 Cough: Secondary | ICD-10-CM | POA: Insufficient documentation

## 2014-12-23 LAB — COMPREHENSIVE METABOLIC PANEL (CC13)
ALT: 20 U/L (ref 0–55)
AST: 20 U/L (ref 5–34)
Albumin: 4.2 g/dL (ref 3.5–5.0)
Alkaline Phosphatase: 98 U/L (ref 40–150)
Anion Gap: 9 mEq/L (ref 3–11)
BUN: 7.2 mg/dL (ref 7.0–26.0)
CO2: 26 mEq/L (ref 22–29)
Calcium: 9.3 mg/dL (ref 8.4–10.4)
Chloride: 108 mEq/L (ref 98–109)
Creatinine: 0.7 mg/dL (ref 0.6–1.1)
Glucose: 110 mg/dl (ref 70–140)
Potassium: 3.9 mEq/L (ref 3.5–5.1)
SODIUM: 142 meq/L (ref 136–145)
TOTAL PROTEIN: 7.1 g/dL (ref 6.4–8.3)
Total Bilirubin: 0.69 mg/dL (ref 0.20–1.20)

## 2014-12-23 LAB — CBC WITH DIFFERENTIAL/PLATELET
BASO%: 0.3 % (ref 0.0–2.0)
Basophils Absolute: 0 10*3/uL (ref 0.0–0.1)
EOS ABS: 0.2 10*3/uL (ref 0.0–0.5)
EOS%: 2.4 % (ref 0.0–7.0)
HCT: 53.9 % — ABNORMAL HIGH (ref 34.8–46.6)
HGB: 17.6 g/dL — ABNORMAL HIGH (ref 11.6–15.9)
LYMPH#: 0.7 10*3/uL — AB (ref 0.9–3.3)
LYMPH%: 11.2 % — ABNORMAL LOW (ref 14.0–49.7)
MCH: 33.3 pg (ref 25.1–34.0)
MCHC: 32.7 g/dL (ref 31.5–36.0)
MCV: 102.1 fL — ABNORMAL HIGH (ref 79.5–101.0)
MONO#: 0.4 10*3/uL (ref 0.1–0.9)
MONO%: 6.8 % (ref 0.0–14.0)
NEUT%: 79.3 % — ABNORMAL HIGH (ref 38.4–76.8)
NEUTROS ABS: 4.9 10*3/uL (ref 1.5–6.5)
PLATELETS: 159 10*3/uL (ref 145–400)
RBC: 5.28 10*6/uL (ref 3.70–5.45)
RDW: 20.9 % — AB (ref 11.2–14.5)
WBC: 6.2 10*3/uL (ref 3.9–10.3)

## 2014-12-23 MED ORDER — IOHEXOL 300 MG/ML  SOLN
80.0000 mL | Freq: Once | INTRAMUSCULAR | Status: AC | PRN
  Administered 2014-12-23: 80 mL via INTRAVENOUS

## 2014-12-24 ENCOUNTER — Telehealth: Payer: Self-pay | Admitting: Internal Medicine

## 2014-12-24 NOTE — Telephone Encounter (Signed)
returned call and s.w. pt and r/s appt....due to other obligations

## 2014-12-28 ENCOUNTER — Ambulatory Visit: Payer: BC Managed Care – PPO | Admitting: Internal Medicine

## 2014-12-31 ENCOUNTER — Encounter: Payer: Self-pay | Admitting: Internal Medicine

## 2014-12-31 ENCOUNTER — Ambulatory Visit (HOSPITAL_BASED_OUTPATIENT_CLINIC_OR_DEPARTMENT_OTHER): Payer: BLUE CROSS/BLUE SHIELD | Admitting: Internal Medicine

## 2014-12-31 VITALS — BP 125/78 | HR 98 | Temp 98.1°F | Resp 18 | Ht 59.0 in | Wt 115.6 lb

## 2014-12-31 DIAGNOSIS — C3491 Malignant neoplasm of unspecified part of right bronchus or lung: Secondary | ICD-10-CM

## 2014-12-31 NOTE — Progress Notes (Signed)
[place] Telephone:(336) [phone]   Fax:(336) [phone]  OFFICE PROGRESS NOTE  Pcp Not In System No address on file  DIAGNOSIS: Limited stage small cell lung cancer diagnosed in March of 2015.  PRIOR THERAPY:  1) Single fraction of radiotherapy to the mediastinum under the care of Dr. Pablo Ledger on 03/13/2014. 2) Systemic chemotherapy with cisplatin 60 mg/M2 day 1 and etoposide 120 mg/M2 on days 1, 2 and 3 with Neulasta support on day 4. First dose on 03/18/2014. Status post 6 cycles. Last dose was given 06/29/2014. 3) prophylactic cranial irradiation under the care of Dr. Pablo Ledger  CURRENT THERAPY: Observation.  INTERVAL HISTORY: Brittany [name] 54 y.o. female returns to the clinic today for followup visit. She has been observation since July 2015. She is feeling fine today with no specific complaints. She denied having any significant fever or chills, no nausea or vomiting. The patient denied having any significant chest pain, shortness of breath, cough or hemoptysis. She has no weight loss or night sweats. She had repeat CT scan of the chest performed recently and she is here for evaluation and discussion of her scan results.  MEDICAL HISTORY: Past Medical History  Diagnosis Date  . Hypertension   . Allergy   . Asthma   . Anxiety   . Prophylactic measure 08/03/14-08/19/14    Prophyl. cranial radiation 24 Gy  . Small cell lung cancer 03/16/2014    ALLERGIES:  is allergic to codeine; iodinated diagnostic agents; and sulfa antibiotics.  MEDICATIONS:  Current Outpatient Prescriptions  Medication Sig Dispense Refill  . albuterol (PROVENTIL HFA) 108 (90 BASE) MCG/ACT inhaler Inhale 2 puffs into the lungs every 4 (four) hours as needed for wheezing or shortness of breath. 6.7 each 5  . albuterol (PROVENTIL) (2.5 MG/3ML) 0.083% nebulizer solution Take 3 mLs (2.5 mg total) by nebulization every 6 (six) hours as needed for wheezing. 150 mL 1  . atorvastatin  (LIPITOR) 20 MG tablet Take 1 tablet (20 mg total) by mouth daily. 90 tablet 0  . budesonide-formoterol (SYMBICORT) 160-4.5 MCG/ACT inhaler Inhale 2 puffs into the lungs 2 (two) times daily.    Marland Kitchen diltiazem (CARDIZEM CD) 120 MG 24 hr capsule Take 1 capsule (120 mg total) by mouth daily. 30 capsule 0  . emollient (BIAFINE) cream Apply topically as needed.    . feeding supplement, RESOURCE BREEZE, (RESOURCE BREEZE) LIQD Take 1 Container by mouth 2 (two) times daily between meals. 30 Container 0  . fluticasone (FLONASE) 50 MCG/ACT nasal spray Place 2 sprays into both nostrils daily.    Marland Kitchen lisinopril (PRINIVIL,ZESTRIL) 10 MG tablet Take 10 mg by mouth daily.    . metoprolol succinate (TOPROL-XL) 50 MG 24 hr tablet Take 1 tablet (50 mg total) by mouth daily. Take with or immediately following a meal. 30 tablet 3  . predniSONE (DELTASONE) 50 MG tablet Take 1 tablet 13 hours, 7 hours and 1 hour before your CT scan. Take with food. You will also take Benadryl 50 mg 1 hour before the CT scan. 6 tablet 0  . prochlorperazine (COMPAZINE) 10 MG tablet Take 1 tablet (10 mg total) by mouth every 6 (six) hours as needed for nausea or vomiting. 60 tablet 0   No current facility-administered medications for this visit.    SURGICAL HISTORY:  Past Surgical History  Procedure Laterality Date  . Tubal ligation    . Cesarean section    . Video bronchoscopy with endobronchial ultrasound N/A 03/11/2014  Procedure: VIDEO BRONCHOSCOPY WITH ENDOBRONCHIAL ULTRASOUND;  Surgeon: Melrose Nakayama, MD;  Location: [facility];  Service: Thoracic;  Laterality: N/A;  . Mediastinoscopy N/A 03/11/2014    Procedure: MEDIASTINOSCOPY;  Surgeon: Melrose Nakayama, MD;  Location: [facility];  Service: Thoracic;  Laterality: N/A;    REVIEW OF SYSTEMS:  Constitutional: positive for fatigue Eyes: negative Ears, nose, mouth, throat, and face: negative Respiratory: negative Cardiovascular: negative Gastrointestinal:  negative Genitourinary:negative Integument/breast: negative Hematologic/lymphatic: negative Musculoskeletal:negative Neurological: negative Behavioral/Psych: negative Endocrine: negative Allergic/Immunologic: negative   PHYSICAL EXAMINATION: General appearance: alert, cooperative and no distress Head: Normocephalic, without obvious abnormality, atraumatic Neck: no adenopathy, no JVD, supple, symmetrical, trachea midline and thyroid not enlarged, symmetric, no tenderness/mass/nodules Lymph nodes: Cervical, supraclavicular, and axillary nodes normal. Resp: wheezes bilaterally Back: symmetric, no curvature. ROM normal. No CVA tenderness. Cardio: regular rate and rhythm, S1, S2 normal, no murmur, click, rub or gallop GI: soft, non-tender; bowel sounds normal; no masses,  no organomegaly Extremities: extremities normal, atraumatic, no cyanosis or edema Neurologic: Alert and oriented X 3, normal strength and tone. Normal symmetric reflexes. Normal coordination and gait  ECOG PERFORMANCE STATUS: 1 - Symptomatic but completely ambulatory  Blood pressure 125/78, pulse 98, temperature 98.1 F (36.7 C), resp. rate 18, height 4\' 11"  (1.499 m), weight 115 lb 9.6 oz (52.436 kg), SpO2 99 %.  LABORATORY DATA: Lab Results  Component Value Date   WBC 6.2 12/23/2014   HGB 17.6* 12/23/2014   HCT 53.9* 12/23/2014   MCV 102.1* 12/23/2014   PLT 159 12/23/2014      Chemistry      Component Value Date/Time   NA 142 12/23/2014 0829   NA 145 07/20/2014 0907   K 3.9 12/23/2014 0829   K 4.9 07/20/2014 0907   CL 106 07/20/2014 0907   CO2 26 12/23/2014 0829   CO2 22 07/20/2014 0907   BUN 7.2 12/23/2014 0829   BUN 12 07/20/2014 0907   CREATININE 0.7 12/23/2014 0829   CREATININE 0.58 07/20/2014 0907   CREATININE 0.56 01/01/2014 1111      Component Value Date/Time   CALCIUM 9.3 12/23/2014 0829   CALCIUM 9.8 07/20/2014 0907   ALKPHOS 98 12/23/2014 0829   ALKPHOS 146* 07/20/2014 0907   AST 20  12/23/2014 0829   AST 20 07/20/2014 0907   ALT 20 12/23/2014 0829   ALT 13 07/20/2014 0907   BILITOT 0.69 12/23/2014 0829   BILITOT <0.2* 07/20/2014 0907       RADIOGRAPHIC STUDIES: Ct Chest W Contrast  12/23/2014   CLINICAL DATA:  Small cell lung cancer, chemotherapy complete. Cough.  EXAM: CT CHEST WITH CONTRAST  TECHNIQUE: Multidetector CT imaging of the chest was performed during intravenous contrast administration.  CONTRAST:  22mL OMNIPAQUE IOHEXOL 300 MG/ML  SOLN  COMPARISON:  09/22/2014.  FINDINGS: Mediastinum/Nodes: Sub cm right thyroid nodule is unchanged. Subcarinal soft tissue measures approximately 1.6 cm in short axis and is somewhat ill-defined, extending along the right mainstem bronchus and bronchus intermedius, as before. No left hilar or axillary adenopathy. Heart size normal. No pericardial effusion.  Lungs/Pleura: Minimal centrilobular emphysema. Mild scarring and volume loss in the right middle lobe. Scattered subpleural nodules measure up to 4 mm along the right major fissure, not definitely seen on the prior exam. Lungs are otherwise clear. No pleural fluid. Airway is unremarkable.  Upper abdomen: Visualized portions of the liver, adrenal glands, kidneys, spleen, pancreas, stomach and bowel are grossly unremarkable.  Musculoskeletal: No worrisome lytic or sclerotic lesions.  IMPRESSION: Stable subcarinal soft tissue. No evidence of recurrent or progressive disease.   Electronically Signed   By: Lorin Picket M.D.   On: 12/23/2014 10:51   ASSESSMENT AND PLAN: This is a very pleasant 54 years old [place] American female recently diagnosed with limited stage small cell lung cancer.  She completed systemic chemotherapy with cisplatin and etoposide status post 6 cycles and tolerating her treatment fairly well. This was followed by prophylactic cranial irradiation. The patient is doing fine and she has no evidence for disease progression on his recent scan. I discussed the scan  results with the patient. I recommended for her to continue on observation with repeat CT scan of the chest in 3 months. She was advised to call immediately if she has any concerning symptoms in the interval. The patient voices understanding of current disease status and treatment options and is in agreement with the current care plan.  All questions were answered. The patient knows to call the clinic with any problems, questions or concerns. We can certainly see the patient much sooner if necessary.  Disclaimer: This note was dictated with voice recognition software. Similar sounding words can inadvertently be transcribed and may not be corrected upon review.

## 2015-03-15 ENCOUNTER — Emergency Department (HOSPITAL_COMMUNITY): Payer: BLUE CROSS/BLUE SHIELD

## 2015-03-15 ENCOUNTER — Inpatient Hospital Stay (HOSPITAL_COMMUNITY)
Admission: EM | Admit: 2015-03-15 | Discharge: 2015-03-18 | DRG: 194 | Disposition: A | Discharge status: Home / Self Care | Payer: BLUE CROSS/BLUE SHIELD | Attending: Internal Medicine | Admitting: Internal Medicine

## 2015-03-15 ENCOUNTER — Encounter (HOSPITAL_COMMUNITY): Payer: Self-pay | Admitting: Emergency Medicine

## 2015-03-15 DIAGNOSIS — R079 Chest pain, unspecified: Secondary | ICD-10-CM | POA: Diagnosis not present

## 2015-03-15 DIAGNOSIS — Z9221 Personal history of antineoplastic chemotherapy: Secondary | ICD-10-CM

## 2015-03-15 DIAGNOSIS — I4891 Unspecified atrial fibrillation: Secondary | ICD-10-CM

## 2015-03-15 DIAGNOSIS — J449 Chronic obstructive pulmonary disease, unspecified: Secondary | ICD-10-CM | POA: Diagnosis present

## 2015-03-15 DIAGNOSIS — Z923 Personal history of irradiation: Secondary | ICD-10-CM

## 2015-03-15 DIAGNOSIS — Z87891 Personal history of nicotine dependence: Secondary | ICD-10-CM | POA: Diagnosis not present

## 2015-03-15 DIAGNOSIS — I480 Paroxysmal atrial fibrillation: Secondary | ICD-10-CM | POA: Diagnosis present

## 2015-03-15 DIAGNOSIS — N390 Urinary tract infection, site not specified: Secondary | ICD-10-CM | POA: Diagnosis present

## 2015-03-15 DIAGNOSIS — R6510 Systemic inflammatory response syndrome (SIRS) of non-infectious origin without acute organ dysfunction: Secondary | ICD-10-CM

## 2015-03-15 DIAGNOSIS — C3491 Malignant neoplasm of unspecified part of right bronchus or lung: Secondary | ICD-10-CM | POA: Diagnosis not present

## 2015-03-15 DIAGNOSIS — Z85118 Personal history of other malignant neoplasm of bronchus and lung: Secondary | ICD-10-CM | POA: Diagnosis not present

## 2015-03-15 DIAGNOSIS — C3490 Malignant neoplasm of unspecified part of unspecified bronchus or lung: Secondary | ICD-10-CM

## 2015-03-15 DIAGNOSIS — Z8249 Family history of ischemic heart disease and other diseases of the circulatory system: Secondary | ICD-10-CM

## 2015-03-15 DIAGNOSIS — I10 Essential (primary) hypertension: Secondary | ICD-10-CM | POA: Diagnosis present

## 2015-03-15 DIAGNOSIS — J45909 Unspecified asthma, uncomplicated: Secondary | ICD-10-CM | POA: Diagnosis present

## 2015-03-15 DIAGNOSIS — Z7951 Long term (current) use of inhaled steroids: Secondary | ICD-10-CM

## 2015-03-15 DIAGNOSIS — R0602 Shortness of breath: Secondary | ICD-10-CM | POA: Diagnosis not present

## 2015-03-15 DIAGNOSIS — J189 Pneumonia, unspecified organism: Secondary | ICD-10-CM | POA: Diagnosis present

## 2015-03-15 LAB — I-STAT CG4 LACTIC ACID, ED
LACTIC ACID, VENOUS: 1.44 mmol/L (ref 0.5–2.0)
LACTIC ACID, VENOUS: 0.99 mmol/L (ref 0.5–2.0)

## 2015-03-15 LAB — BASIC METABOLIC PANEL
Anion gap: 16 — ABNORMAL HIGH (ref 5–15)
BUN: 17 mg/dL (ref 6–23)
CALCIUM: 9.9 mg/dL (ref 8.4–10.5)
CO2: 23 mmol/L (ref 19–32)
CREATININE: 0.7 mg/dL (ref 0.50–1.10)
Chloride: 100 mmol/L (ref 96–112)
GFR calc non Af Amer: >90 mL/min (ref >90)
Glucose, Bld: 119 mg/dL — ABNORMAL HIGH (ref 70–99)
Potassium: 3.7 mmol/L (ref 3.5–5.1)
Sodium: 139 mmol/L (ref 135–145)

## 2015-03-15 LAB — PROTIME-INR
INR: 1.14 (ref 0.00–1.49)
Prothrombin Time: 14.7 seconds (ref 11.6–15.2)

## 2015-03-15 LAB — HEPATIC FUNCTION PANEL
ALBUMIN: 4.3 g/dL (ref 3.5–5.2)
ALT: 15 U/L (ref 0–35)
AST: 18 U/L (ref 0–37)
Alkaline Phosphatase: 93 U/L (ref 39–117)
BILIRUBIN INDIRECT: 1 mg/dL — AB (ref 0.3–0.9)
Bilirubin, Direct: 0.2 mg/dL (ref 0.0–0.5)
TOTAL PROTEIN: 8.4 g/dL — AB (ref 6.0–8.3)
Total Bilirubin: 1.2 mg/dL (ref 0.3–1.2)

## 2015-03-15 LAB — URINALYSIS, ROUTINE W REFLEX MICROSCOPIC
GLUCOSE, UA: NEGATIVE mg/dL
Ketones, ur: 15 mg/dL — AB
NITRITE: NEGATIVE
PH: 5.5 (ref 5.0–8.0)
PROTEIN: 100 mg/dL — AB
Specific Gravity, Urine: 1.03 (ref 1.005–1.030)
Urobilinogen, UA: 1 mg/dL (ref 0.0–1.0)

## 2015-03-15 LAB — I-STAT TROPONIN, ED: Troponin i, poc: 0 ng/mL (ref 0.00–0.08)

## 2015-03-15 LAB — CBC
HCT: 53.6 % — ABNORMAL HIGH (ref 36.0–46.0)
Hemoglobin: 18.1 g/dL — ABNORMAL HIGH (ref 12.0–15.0)
MCH: 33.8 pg (ref 26.0–34.0)
MCHC: 33.8 g/dL (ref 30.0–36.0)
MCV: 100.2 fL — ABNORMAL HIGH (ref 78.0–100.0)
PLATELETS: 173 10*3/uL (ref 150–400)
RBC: 5.35 MIL/uL — ABNORMAL HIGH (ref 3.87–5.11)
RDW: 21.2 % — ABNORMAL HIGH (ref 11.5–15.5)
WBC: 6.3 10*3/uL (ref 4.0–10.5)

## 2015-03-15 LAB — URINE MICROSCOPIC-ADD ON

## 2015-03-15 LAB — TROPONIN I

## 2015-03-15 LAB — LIPASE, BLOOD: Lipase: 16 U/L (ref 11–59)

## 2015-03-15 LAB — BRAIN NATRIURETIC PEPTIDE: B NATRIURETIC PEPTIDE 5: 58.8 pg/mL (ref 0.0–100.0)

## 2015-03-15 MED ORDER — LISINOPRIL 10 MG PO TABS
10.0000 mg | ORAL_TABLET | Freq: Every day | ORAL | Status: DC
  Administered 2015-03-15 – 2015-03-18 (×4): 10 mg via ORAL
  Filled 2015-03-15 (×4): qty 1

## 2015-03-15 MED ORDER — METHYLPREDNISOLONE SODIUM SUCC 125 MG IJ SOLR
125.0000 mg | Freq: Once | INTRAMUSCULAR | Status: AC
  Administered 2015-03-15: 125 mg via INTRAVENOUS
  Filled 2015-03-15: qty 2

## 2015-03-15 MED ORDER — SODIUM CHLORIDE 0.9 % IV BOLUS (SEPSIS)
30.0000 mL/kg | Freq: Once | INTRAVENOUS | Status: AC
  Administered 2015-03-15: 1560 mL via INTRAVENOUS

## 2015-03-15 MED ORDER — AZITHROMYCIN 500 MG IV SOLR
500.0000 mg | INTRAVENOUS | Status: DC
  Administered 2015-03-15 – 2015-03-17 (×3): 500 mg via INTRAVENOUS
  Filled 2015-03-15 (×3): qty 500

## 2015-03-15 MED ORDER — DIPHENHYDRAMINE HCL 50 MG/ML IJ SOLN
25.0000 mg | Freq: Once | INTRAMUSCULAR | Status: AC
  Administered 2015-03-15: 25 mg via INTRAVENOUS
  Filled 2015-03-15: qty 1

## 2015-03-15 MED ORDER — GUAIFENESIN ER 600 MG PO TB12
600.0000 mg | ORAL_TABLET | Freq: Two times a day (BID) | ORAL | Status: DC | PRN
  Administered 2015-03-16: 600 mg via ORAL
  Filled 2015-03-15: qty 1

## 2015-03-15 MED ORDER — DEXTROSE 5 % IV SOLN
1.0000 g | Freq: Three times a day (TID) | INTRAVENOUS | Status: DC
  Filled 2015-03-15: qty 1

## 2015-03-15 MED ORDER — ONDANSETRON HCL 4 MG/2ML IJ SOLN
4.0000 mg | Freq: Once | INTRAMUSCULAR | Status: AC
  Administered 2015-03-15: 4 mg via INTRAVENOUS
  Filled 2015-03-15: qty 2

## 2015-03-15 MED ORDER — SODIUM CHLORIDE 0.9 % IV SOLN
INTRAVENOUS | Status: DC
  Administered 2015-03-15 – 2015-03-18 (×5): via INTRAVENOUS

## 2015-03-15 MED ORDER — DILTIAZEM HCL ER COATED BEADS 120 MG PO CP24: 120.0000 mg | ORAL_CAPSULE | Freq: Every day | ORAL | Status: DC

## 2015-03-15 MED ORDER — ONDANSETRON HCL 4 MG/2ML IJ SOLN: 4.0000 mg | Freq: Four times a day (QID) | INTRAMUSCULAR | Status: DC | PRN

## 2015-03-15 MED ORDER — RIVAROXABAN 20 MG PO TABS
20.0000 mg | ORAL_TABLET | Freq: Every day | ORAL | Status: DC
  Administered 2015-03-15 – 2015-03-17 (×3): 20 mg via ORAL
  Filled 2015-03-15 (×4): qty 1

## 2015-03-15 MED ORDER — DILTIAZEM HCL ER COATED BEADS 120 MG PO CP24
120.0000 mg | ORAL_CAPSULE | Freq: Every day | ORAL | Status: DC
  Administered 2015-03-15 – 2015-03-18 (×4): 120 mg via ORAL
  Filled 2015-03-15 (×4): qty 1

## 2015-03-15 MED ORDER — CETYLPYRIDINIUM CHLORIDE 0.05 % MT LIQD
7.0000 mL | Freq: Two times a day (BID) | OROMUCOSAL | Status: DC
  Administered 2015-03-15 – 2015-03-17 (×4): 7 mL via OROMUCOSAL

## 2015-03-15 MED ORDER — IPRATROPIUM-ALBUTEROL 0.5-2.5 (3) MG/3ML IN SOLN
3.0000 mL | RESPIRATORY_TRACT | Status: DC
  Administered 2015-03-15 (×2): 3 mL via RESPIRATORY_TRACT
  Filled 2015-03-15 (×2): qty 3

## 2015-03-15 MED ORDER — HYDROMORPHONE HCL 1 MG/ML IJ SOLN
1.0000 mg | Freq: Once | INTRAMUSCULAR | Status: AC
  Administered 2015-03-15: 1 mg via INTRAVENOUS
  Filled 2015-03-15: qty 1

## 2015-03-15 MED ORDER — CEFTRIAXONE SODIUM IN DEXTROSE 20 MG/ML IV SOLN
1.0000 g | INTRAVENOUS | Status: DC
  Administered 2015-03-15 – 2015-03-17 (×3): 1 g via INTRAVENOUS
  Filled 2015-03-15 (×4): qty 50

## 2015-03-15 MED ORDER — MORPHINE SULFATE 2 MG/ML IJ SOLN: 1.0000 mg | INTRAMUSCULAR | Status: DC | PRN

## 2015-03-15 MED ORDER — HYDROCODONE-HOMATROPINE 5-1.5 MG/5ML PO SYRP
5.0000 mL | ORAL_SOLUTION | Freq: Once | ORAL | Status: AC
  Administered 2015-03-15: 5 mL via ORAL
  Filled 2015-03-15: qty 5

## 2015-03-15 MED ORDER — IOHEXOL 350 MG/ML SOLN
100.0000 mL | Freq: Once | INTRAVENOUS | Status: AC | PRN
  Administered 2015-03-15: 100 mL via INTRAVENOUS

## 2015-03-15 MED ORDER — VANCOMYCIN HCL IN DEXTROSE 750-5 MG/150ML-% IV SOLN: 750.0000 mg | Freq: Two times a day (BID) | INTRAVENOUS | Status: DC

## 2015-03-15 MED ORDER — VANCOMYCIN HCL IN DEXTROSE 1-5 GM/200ML-% IV SOLN
1000.0000 mg | Freq: Once | INTRAVENOUS | Status: AC
  Administered 2015-03-15: 1000 mg via INTRAVENOUS
  Filled 2015-03-15: qty 200

## 2015-03-15 MED ORDER — IPRATROPIUM-ALBUTEROL 0.5-2.5 (3) MG/3ML IN SOLN: 3.0000 mL | RESPIRATORY_TRACT | Status: DC

## 2015-03-15 MED ORDER — METOPROLOL SUCCINATE ER 50 MG PO TB24
50.0000 mg | ORAL_TABLET | Freq: Every day | ORAL | Status: DC
  Administered 2015-03-15 – 2015-03-18 (×4): 50 mg via ORAL
  Filled 2015-03-15 (×4): qty 1

## 2015-03-15 MED ORDER — ALBUTEROL SULFATE (2.5 MG/3ML) 0.083% IN NEBU: 3.0000 mL | INHALATION_SOLUTION | RESPIRATORY_TRACT | Status: DC | PRN

## 2015-03-15 MED ORDER — ENOXAPARIN SODIUM 40 MG/0.4ML ~~LOC~~ SOLN: 40.0000 mg | SUBCUTANEOUS | Status: DC

## 2015-03-15 MED ORDER — DEXTROSE 5 % IV SOLN
2.0000 g | Freq: Once | INTRAVENOUS | Status: AC
  Administered 2015-03-15: 2 g via INTRAVENOUS
  Filled 2015-03-15: qty 2

## 2015-03-15 MED ORDER — IPRATROPIUM-ALBUTEROL 0.5-2.5 (3) MG/3ML IN SOLN
3.0000 mL | Freq: Three times a day (TID) | RESPIRATORY_TRACT | Status: DC
  Administered 2015-03-16 – 2015-03-18 (×6): 3 mL via RESPIRATORY_TRACT
  Filled 2015-03-15 (×7): qty 3

## 2015-03-15 MED ORDER — OXYCODONE HCL 5 MG PO TABS: 5.0000 mg | ORAL_TABLET | ORAL | Status: DC | PRN

## 2015-03-15 MED ORDER — CHLORHEXIDINE GLUCONATE 0.12 % MT SOLN
15.0000 mL | Freq: Two times a day (BID) | OROMUCOSAL | Status: DC
  Administered 2015-03-15 – 2015-03-18 (×6): 15 mL via OROMUCOSAL
  Filled 2015-03-15 (×6): qty 15

## 2015-03-15 MED ORDER — ATORVASTATIN CALCIUM 20 MG PO TABS
20.0000 mg | ORAL_TABLET | Freq: Every day | ORAL | Status: DC
  Administered 2015-03-15 – 2015-03-18 (×4): 20 mg via ORAL
  Filled 2015-03-15 (×4): qty 1

## 2015-03-15 NOTE — Progress Notes (Signed)
ANTIBIOTIC CONSULT NOTE - INITIAL  Pharmacy Consult for Vancomycin and Cefepime Indication: rule out pneumonia  Allergies  Allergen Reactions  . Codeine Nausea And Vomiting  . Iodinated Diagnostic Agents Hives and Rash  . Sulfa Antibiotics Hives and Rash    Patient Measurements: Weight: 114 lb 10.2 oz (52 kg)  Vital Signs: BP: 110/86 mmHg (04/04 1013) Pulse Rate: 111 (04/04 1013) Intake/Output from previous day:   Intake/Output from this shift:    Labs:  Recent Labs  03/15/15 1100  WBC 6.3  HGB 18.1*  PLT 173  CREATININE 0.70   Estimated Creatinine Clearance: 59.3 mL/min (by C-G formula based on Cr of 0.7). No results for input(s): VANCOTROUGH, VANCOPEAK, VANCORANDOM, GENTTROUGH, GENTPEAK, GENTRANDOM, TOBRATROUGH, TOBRAPEAK, TOBRARND, AMIKACINPEAK, AMIKACINTROU, AMIKACIN in the last 72 hours.   Microbiology: No results found for this or any previous visit (from the past 720 hour(s)).  Medical History: Past Medical History  Diagnosis Date  . Hypertension   . Allergy   . Asthma   . Anxiety   . Prophylactic measure 08/03/14-08/19/14    Prophyl. cranial radiation 24 Gy  . Small cell lung cancer 03/16/2014    Medications:  Scheduled:   Infusions:  . vancomycin 1,000 mg (03/15/15 1118)   PRN:   Assessment: 54 yo female with hx lung cancer in remission (last chemo 06/29/14) who presents with cough, nausea and chest pain. Code sepsis called, pharmacy called to dose vancomycin and cefepime.  4/4 >> Vanc >> 4/4 >> Cefepime >>  Tmax: WBC: 6.3 Renal: SCr 0.7, CrCl 59 CG, 90 N (using 0.8) Lactate: 1.44  4/4 blood x 2: 4/4 urine:  Goal of Therapy:  Vancomycin trough level 15-20 mcg/ml  Cefepime dose per indication, renal function  Plan:   Vancomycin 1g IV x 1 in ED, then 750mg  IV q12h Check trough at steady state Cefepime 2g IV x 1 in ED, then 1g IV q8h Follow up renal function & cultures, clinical course  Peggyann Juba, PharmD, BCPS Pager:  [phone] 03/15/2015,12:09 PM

## 2015-03-15 NOTE — ED Notes (Signed)
Pt with Hx of lung cancer in remission c/o cough, nausea, chest pain.

## 2015-03-15 NOTE — H&P (Addendum)
[facility] History and Physical  Brittany [name] IRS:[id number] DOB: [birth year] DOA: 03/15/2015  Referring physician:  PCP: Pcp Not In System   Chief Complaint: SOB/Cough  HPI: Brittany [name] is a 54 y.o. female with a past medical history of limited stage small cell lung cancer diagnosed in March 2015 undergoing single fraction radiotherapy to the mediastinum, systemic chemotherapy with cisplatin and etoposide x 6 cycles completing treatment on 06/29/2014, last seen in the oncology clinic on 12/31/2014 at which time she was found to be doing well. She presents to the emergency department with complaints of cough associated with shortness of breath that started last Thursday. She reports associated brown sputum production along with retrosternal chest pain characterized as dull and achy most times precipitated by coughing. She complains of subjective fevers, chills, nausea, vomiting, malaise and generally feeling ill. She denies taking recent antibiotic therapy. Workup in the emergency department included a chest x-ray that did not reveal acute active disease. CT scan of lungs revealed new spiculated airspace opacity in the right upper lobe and right lower lobe which radiology felt was most concerning for multifocal lobar pneumonia. Radiology report stated that recurrent malignancy is considered much less likely. There is no evidence for pulmonary embolism. Case discussed with her oncologist Dr. Julien Nordmann who agreed that this likely represented community-acquired pneumonia. She will be followed in the outpatient setting for repeat scans.                                                                                                                                                                                                  Review of Systems:  Constitutional:  No weight loss, night sweats, positive for Fevers, chills, fatigue, malaise.  HEENT:  No headaches, Difficulty  swallowing,Tooth/dental problems,Sore throat,  No sneezing, itching, ear ache, nasal congestion, post nasal drip,  Cardio-vascular:  Positive for chest pain, Orthopnea, PND, swelling in lower extremities, anasarca, dizziness, palpitations  GI:  No heartburn, indigestion, abdominal pain, nausea, vomiting, diarrhea, change in bowel habits, loss of appetite  Resp:  Positive for shortness of breath with exertion or at rest, excess mucus, productive cough, No non-productive cough, No coughing up of blood.No change in color of mucus.No wheezing.No chest wall deformity  Skin:  no rash or lesions.  GU:  no dysuria, change in color of urine, no urgency or frequency. No flank pain.  Musculoskeletal:  No joint pain or swelling. No decreased range of motion. No back pain.  Psych:  No change in mood or affect. No depression or anxiety. No memory loss.   Past Medical History  Diagnosis Date  .  Hypertension   . Allergy   . Asthma   . Anxiety   . Prophylactic measure 08/03/14-08/19/14    Prophyl. cranial radiation 24 Gy  . Small cell lung cancer 03/16/2014   Past Surgical History  Procedure Laterality Date  . Tubal ligation    . Cesarean section    . Video bronchoscopy with endobronchial ultrasound N/A 03/11/2014    Procedure: VIDEO BRONCHOSCOPY WITH ENDOBRONCHIAL ULTRASOUND;  Surgeon: Melrose Nakayama, MD;  Location: [place];  Service: Thoracic;  Laterality: N/A;  . Mediastinoscopy N/A 03/11/2014    Procedure: MEDIASTINOSCOPY;  Surgeon: Melrose Nakayama, MD;  Location: [place];  Service: Thoracic;  Laterality: N/A;   Social History:  reports that she quit smoking about a year ago. Her smoking use included Cigarettes. She has a 20 pack-year smoking history. She has never used smokeless tobacco. She reports that she drinks about 4.8 oz of alcohol per week. She reports that she does not use illicit drugs.  Allergies  Allergen Reactions  . Codeine Nausea And Vomiting  . Iodinated Diagnostic Agents  Hives and Rash  . Sulfa Antibiotics Hives and Rash    Family History  Problem Relation Age of Onset  . Heart disease Mother   . Hypertension Mother      Prior to Admission medications   Medication Sig Start Date End Date Taking? Authorizing Provider  albuterol (PROVENTIL) (2.5 MG/3ML) 0.083% nebulizer solution Take 2.5 mg by nebulization every 6 (six) hours as needed for wheezing or shortness of breath (wheezing and shortness of breath).   Yes Historical Provider, MD  atorvastatin (LIPITOR) 20 MG tablet Take 1 tablet (20 mg total) by mouth daily. 01/02/14  Yes Roselee Culver, MD  diltiazem (CARDIZEM CD) 120 MG 24 hr capsule Take 1 capsule (120 mg total) by mouth daily. 03/14/14  Yes Velvet Bathe, MD  feeding supplement, RESOURCE BREEZE, (RESOURCE BREEZE) LIQD Take 1 Container by mouth 2 (two) times daily between meals. 03/14/14  Yes Velvet Bathe, MD  guaiFENesin (MUCINEX) 600 MG 12 hr tablet Take 600 mg by mouth 2 (two) times daily as needed for cough (cough).   Yes Historical Provider, MD  lisinopril (PRINIVIL,ZESTRIL) 10 MG tablet Take 10 mg by mouth daily. 12/25/13  Yes Historical Provider, MD  metoprolol succinate (TOPROL-XL) 50 MG 24 hr tablet Take 1 tablet (50 mg total) by mouth daily. Take with or immediately following a meal. 12/30/13  Yes Roselee Culver, MD  triamcinolone (NASACORT) 55 MCG/ACT AERO nasal inhaler Place 2 sprays into both nostrils daily.   Yes Historical Provider, MD  albuterol (PROVENTIL HFA) 108 (90 BASE) MCG/ACT inhaler Inhale 2 puffs into the lungs every 4 (four) hours as needed for wheezing or shortness of breath. 02/24/14   Roselee Culver, MD  albuterol (PROVENTIL) (2.5 MG/3ML) 0.083% nebulizer solution Take 3 mLs (2.5 mg total) by nebulization every 6 (six) hours as needed for wheezing. Patient not taking: Reported on 03/15/2015 02/24/14 02/24/15  Roselee Culver, MD  predniSONE (DELTASONE) 50 MG tablet Take 1 tablet 13 hours, 7 hours and 1 hour before your CT  scan. Take with food. You will also take Benadryl 50 mg 1 hour before the CT scan. Patient not taking: Reported on 03/15/2015 05/20/14   Carlton Adam, PA-C  prochlorperazine (COMPAZINE) 10 MG tablet Take 1 tablet (10 mg total) by mouth every 6 (six) hours as needed for nausea or vomiting. Patient not taking: Reported on 03/15/2015 03/16/14   Curt Bears, MD  Physical Exam: Filed Vitals:   03/15/15 1013 03/15/15 1024  BP: 110/86   Pulse: 111   Resp: 16   Weight:  52 kg (114 lb 10.2 oz)  SpO2: 100%     Wt Readings from Last 3 Encounters:  03/15/15 52 kg (114 lb 10.2 oz)  12/31/14 52.436 kg (115 lb 9.6 oz)  09/24/14 54.023 kg (119 lb 1.6 oz)    General:  Ill-appearing, no acute distress. She is awake and alert, following commands. On supplemental oxygen. Eyes: PERRL, normal lids, irises & conjunctiva ENT: grossly normal hearing, lips & tongue Neck: no LAD, masses or thyromegaly Cardiovascular: RRR, no m/r/g. No LE edema. Telemetry: SR, no arrhythmias  Respiratory: She has presence of rhonchi bilaterally, no wheezing rales or crepitations. On supplemental oxygen 2 L nasal cannula. She does not appear to be in acute respiratory distress, not using accessory muscles. Abdomen: soft, ntnd Skin: no rash or induration seen on limited exam Musculoskeletal: grossly normal tone BUE/BLE Psychiatric: grossly normal mood and affect, speech fluent and appropriate Neurologic: grossly non-focal.          Labs on Admission:  Basic Metabolic Panel:  Recent Labs Lab 03/15/15 1100  NA 139  K 3.7  CL 100  CO2 23  GLUCOSE 119*  BUN 17  CREATININE 0.70  CALCIUM 9.9   Liver Function Tests:  Recent Labs Lab 03/15/15 1100  AST 18  ALT 15  ALKPHOS 93  BILITOT 1.2  PROT 8.4*  ALBUMIN 4.3    Recent Labs Lab 03/15/15 1100  LIPASE 16   No results for input(s): AMMONIA in the last 168 hours. CBC:  Recent Labs Lab 03/15/15 1100  WBC 6.3  HGB 18.1*  HCT 53.6*  MCV 100.2*    PLT 173   Cardiac Enzymes: No results for input(s): CKTOTAL, CKMB, CKMBINDEX, TROPONINI in the last 168 hours.  BNP (last 3 results)  Recent Labs  03/15/15 1100  BNP 58.8    ProBNP (last 3 results) No results for input(s): PROBNP in the last 8760 hours.  CBG: No results for input(s): GLUCAP in the last 168 hours.  Radiological Exams on Admission: Ct Angio Chest Pe W/cm &/or Wo Cm  03/15/2015   CLINICAL DATA:  Shortness of breath, history of lung cancer, history of chemotherapy and radiation therapy  EXAM: CT ANGIOGRAPHY CHEST WITH CONTRAST  TECHNIQUE: Multidetector CT imaging of the chest was performed using the standard protocol during bolus administration of intravenous contrast. Multiplanar CT image reconstructions and MIPs were obtained to evaluate the vascular anatomy.  CONTRAST:  139mL OMNIPAQUE IOHEXOL 350 MG/ML SOLN  COMPARISON:  12/23/2014, 09/22/2014, 06/05/2014  FINDINGS: There is adequate opacification of the pulmonary arteries. There is no pulmonary embolus. The main pulmonary artery, right main pulmonary artery and left main pulmonary arteries are normal in size. The heart size is normal. There is no pericardial effusion.  Stable subcarinal soft tissue prominence measuring 16 mm in short axis with ill defined margins extending along the right mainstem bronchus, bronchus intermedius and around the left mainstem bronchus unchanged in the prior exam. There is a stable 10 mm left hilar lymph node.  There is centrilobular emphysema. There is scarring and volume loss in the right middle lobe. There is a new spiculated 16 x 13 mm airspace opacity in the right upper lobe and in adjacent smaller spiculated 7 mm nodular opacity. There is a new subpleural 10 x 14 mm spiculated nodular opacity in the right lower lobe. There is no  pleural effusion or pneumothorax.  There is no lytic or blastic osseous lesion.  The visualized portions of the upper abdomen are unremarkable.  Review of the MIP  images confirms the above findings.  IMPRESSION: 1. New spiculated airspace opacity in the right upper lobe and right lower lobe most concerning for multilobar pneumonia. Recurrent malignancy is considered much less likely. Attention on follow-up examinations is recommended. 2. No evidence of pulmonary embolus. 3. Stable subcarinal soft tissue prominence.   Electronically Signed   By: Kathreen Devoid   On: 03/15/2015 12:57   Dg Chest Port 1 View  03/15/2015   CLINICAL DATA:  Chest pain, shortness of breath  EXAM: PORTABLE CHEST - 1 VIEW  COMPARISON:  CT chest 12/23/2014  FINDINGS: The heart size and mediastinal contours are within normal limits. Both lungs are clear. The visualized skeletal structures are unremarkable.  IMPRESSION: No active disease.   Electronically Signed   By: Kathreen Devoid   On: 03/15/2015 11:36    EKG: Independently reviewed.   Assessment/Plan Principal Problem:   CAP (community acquired pneumonia) Active Problems:   Chest pain   HTN (hypertension)   Atrial fibrillation   Small cell lung cancer   Pneumonia   1. Community-acquired pneumonia. Patient presenting with clinical signs and symptoms suggestive of community-acquired pneumonia, with CT scanning showing the presence of airspace opacity located in the right upper lobe and right lower lobe that is most concerning for multilobar pneumonia. She has not had recent hospitalizations, completing chemoradiation therapy last year. Will treated for a community-acquired pneumonia, start empiric IV antibiotic therapy with ceftriaxone and azithromycin, follow-up on blood cultures and sputum cultures, admit to telemetry, provide supportive care. 2. Limited stage small cell lung cancer. Patient completing systemic chemotherapy with cisplatin and etoposide 6 cycles and radiation therapy last year. CT scan findings were reported by radiology to be most consistent with pneumonia rather than recurrence of malignancy. I discussed case with her  oncologist Dr.Mohamed who recommended treating patient for pneumonia as she would be followed up in the cancer center for repeat imaging studies of lungs. 3. Chest pain. Likely secondary to pneumonia as symptoms appear pleuritic in nature. CT scan of lungs do not reveal evidence of pulmonary embolism. She'll be treated for committed acquire pneumonia. 4. History of paroxysmal atrial fibrillation, CHADSVasc score of 2. Patient seen by cardiology in the past. She is currently not anticoagulated, per notes in EPIC anticoagulation had not been started because of newly diagnosed lung cancer in April 2015, however Dr Harl Bowie did feel that anticoagulation should be considered. She did not follow up with Cardiology after completing treatment for cancer. I discussed the risks and benefits of anticoagulation with her and she choose to start anticoagulation. Will start Xarelto 20 mg PO q daily. Continue Cardizem 120 mg by mouth daily and metoprolol 50 mg PO q daily 5.  Hypertension. Will continue Cardizem 120 mg PO q daily and metoprolol 50 mg PO q daily 6. UTI. U/A showing presence of many bacteria and WBC's. She is getting empiric treatment with IV Ceftriaxone. Will follow up on urine cultures.  7. Asthma/COPD. Will place her on DuoNebs q 4 hours. Did not havesignificant wheezing on lung exam    Code Status: Full Code DVT Prophylaxis: Pt anticoagulated.  Family Communication: I spoke with her sister who was present at [facility] Disposition Plan: I anticipate her requiring greater than 2 nights hospitalization, will admit to the inpatient service.   Time spent: 70 min  Kelvin Cellar [facility] Pager [phone]

## 2015-03-15 NOTE — ED Provider Notes (Signed)
CSN: [id number]     Arrival date & time 03/15/15  1002 History   First MD Initiated Contact with Patient 03/15/15 1017     Chief Complaint  Patient presents with  . Chest Pain     (Consider location/radiation/quality/duration/timing/severity/associated sxs/prior Treatment) HPI The patient reports harsh cough starting 4 days ago. She reports she was having some episodes of chills. She felt like she had a fever. Cough was initially productive of brownish sputum and then green. She reports today she thought there might of been some blood streaking in the sputum. She has become increasingly short of breath. She reports she has very sharp pain on the right side of her chest with cough or deep inspiration. She reports this feels similar to a prior episode of pneumonia. The patient is currently in remission from lung cancer. She does not have any history of PE or DVT. Past Medical History  Diagnosis Date  . Hypertension   . Allergy   . Asthma   . Anxiety   . Prophylactic measure 08/03/14-08/19/14    Prophyl. cranial radiation 24 Gy  . Small cell lung cancer 03/16/2014   Past Surgical History  Procedure Laterality Date  . Tubal ligation    . Cesarean section    . Video bronchoscopy with endobronchial ultrasound N/A 03/11/2014    Procedure: VIDEO BRONCHOSCOPY WITH ENDOBRONCHIAL ULTRASOUND;  Surgeon: Melrose Nakayama, MD;  Location: [facility];  Service: Thoracic;  Laterality: N/A;  . Mediastinoscopy N/A 03/11/2014    Procedure: MEDIASTINOSCOPY;  Surgeon: Melrose Nakayama, MD;  Location: [facility] OR;  Service: Thoracic;  Laterality: N/A;   Family History  Problem Relation Age of Onset  . Heart disease Mother   . Hypertension Mother    History  Substance Use Topics  . Smoking status: Former Smoker -- 1.00 packs/day for 20 years    Types: Cigarettes    Quit date: 03/13/2014  . Smokeless tobacco: Not on file  . Alcohol Use: 4.8 oz/week    8 Glasses of wine per week   OB History    No data  available     Review of Systems  10 Systems reviewed and are negative for acute change except as noted in the HPI.   Allergies  Codeine; Iodinated diagnostic agents; and Sulfa antibiotics  Home Medications   Prior to Admission medications   Medication Sig Start Date End Date Taking? Authorizing Provider  albuterol (PROVENTIL) (2.5 MG/3ML) 0.083% nebulizer solution Take 2.5 mg by nebulization every 6 (six) hours as needed for wheezing or shortness of breath (wheezing and shortness of breath).   Yes Historical Provider, MD  atorvastatin (LIPITOR) 20 MG tablet Take 1 tablet (20 mg total) by mouth daily. 01/02/14  Yes Roselee Culver, MD  diltiazem (CARDIZEM CD) 120 MG 24 hr capsule Take 1 capsule (120 mg total) by mouth daily. 03/14/14  Yes Velvet Bathe, MD  feeding supplement, RESOURCE BREEZE, (RESOURCE BREEZE) LIQD Take 1 Container by mouth 2 (two) times daily between meals. 03/14/14  Yes Velvet Bathe, MD  guaiFENesin (MUCINEX) 600 MG 12 hr tablet Take 600 mg by mouth 2 (two) times daily as needed for cough (cough).   Yes Historical Provider, MD  lisinopril (PRINIVIL,ZESTRIL) 10 MG tablet Take 10 mg by mouth daily. 12/25/13  Yes Historical Provider, MD  metoprolol succinate (TOPROL-XL) 50 MG 24 hr tablet Take 1 tablet (50 mg total) by mouth daily. Take with or immediately following a meal. 12/30/13  Yes Roselee Culver, MD  triamcinolone (NASACORT) 55 MCG/ACT AERO nasal inhaler Place 2 sprays into both nostrils daily.   Yes Historical Provider, MD  albuterol (PROVENTIL HFA) 108 (90 BASE) MCG/ACT inhaler Inhale 2 puffs into the lungs every 4 (four) hours as needed for wheezing or shortness of breath. 02/24/14   Roselee Culver, MD  albuterol (PROVENTIL) (2.5 MG/3ML) 0.083% nebulizer solution Take 3 mLs (2.5 mg total) by nebulization every 6 (six) hours as needed for wheezing. Patient not taking: Reported on 03/15/2015 02/24/14 02/24/15  Roselee Culver, MD  predniSONE (DELTASONE) 50 MG tablet  Take 1 tablet 13 hours, 7 hours and 1 hour before your CT scan. Take with food. You will also take Benadryl 50 mg 1 hour before the CT scan. Patient not taking: Reported on 03/15/2015 05/20/14   Carlton Adam, PA-C  prochlorperazine (COMPAZINE) 10 MG tablet Take 1 tablet (10 mg total) by mouth every 6 (six) hours as needed for nausea or vomiting. Patient not taking: Reported on 03/15/2015 03/16/14   Curt Bears, MD   BP 110/86 mmHg  Pulse 111  Resp 16  Wt 114 lb 10.2 oz (52 kg)  SpO2 100% Physical Exam  Constitutional: She is oriented to person, place, and time. She appears well-developed and well-nourished.  The patient appears to be in moderate to severe pain with deep inspiration and cough. She is alert and mental status is clear. She is tachypneaic.   HENT:  Head: Normocephalic and atraumatic.  Eyes: EOM are normal. Pupils are equal, round, and reactive to light.  Neck: Neck supple.  Cardiovascular: Regular rhythm, normal heart sounds and intact distal pulses.   Tachycardia  Pulmonary/Chest:  The patient is to But appropriately speaking in full sentences. She has intermittent harsh cough which seems to cause her significant pain. There is symmetric air flow but some rail to the right base.  Abdominal: Soft. Bowel sounds are normal. She exhibits no distension. There is no tenderness.  Musculoskeletal: Normal range of motion. She exhibits no edema.  Neurological: She is alert and oriented to person, place, and time. She has normal strength. Coordination normal. GCS eye subscore is 4. GCS verbal subscore is 5. GCS motor subscore is 6.  Skin: Skin is warm, dry and intact.  Psychiatric: She has a normal mood and affect.    ED Course  Procedures (including critical care time) Labs Review Labs Reviewed  CBC - Abnormal; Notable for the following:    RBC 5.35 (*)    Hemoglobin 18.1 (*)    HCT 53.6 (*)    MCV 100.2 (*)    RDW 21.2 (*)    All other components within normal limits   BASIC METABOLIC PANEL - Abnormal; Notable for the following:    Glucose, Bld 119 (*)    Anion gap 16 (*)    All other components within normal limits  URINALYSIS, ROUTINE W REFLEX MICROSCOPIC - Abnormal; Notable for the following:    Color, Urine ORANGE (*)    APPearance CLOUDY (*)    Hgb urine dipstick MODERATE (*)    Bilirubin Urine MODERATE (*)    Ketones, ur 15 (*)    Protein, ur 100 (*)    Leukocytes, UA MODERATE (*)    All other components within normal limits  HEPATIC FUNCTION PANEL - Abnormal; Notable for the following:    Total Protein 8.4 (*)    Indirect Bilirubin 1.0 (*)    All other components within normal limits  URINE MICROSCOPIC-ADD ON - Abnormal; Notable  for the following:    Squamous Epithelial / LPF MANY (*)    Bacteria, UA MANY (*)    Casts HYALINE CASTS (*)    All other components within normal limits  CULTURE, BLOOD (ROUTINE X 2)  CULTURE, BLOOD (ROUTINE X 2)  URINE CULTURE  BRAIN NATRIURETIC PEPTIDE  LIPASE, BLOOD  PROTIME-INR  I-STAT TROPOININ, ED  I-STAT CG4 LACTIC ACID, ED  I-STAT TROPOININ, ED  I-STAT CG4 LACTIC ACID, ED    Imaging Review Ct Angio Chest Pe W/cm &/or Wo Cm  03/15/2015   CLINICAL DATA:  Shortness of breath, history of lung cancer, history of chemotherapy and radiation therapy  EXAM: CT ANGIOGRAPHY CHEST WITH CONTRAST  TECHNIQUE: Multidetector CT imaging of the chest was performed using the standard protocol during bolus administration of intravenous contrast. Multiplanar CT image reconstructions and MIPs were obtained to evaluate the vascular anatomy.  CONTRAST:  111mL OMNIPAQUE IOHEXOL 350 MG/ML SOLN  COMPARISON:  12/23/2014, 09/22/2014, 06/05/2014  FINDINGS: There is adequate opacification of the pulmonary arteries. There is no pulmonary embolus. The main pulmonary artery, right main pulmonary artery and left main pulmonary arteries are normal in size. The heart size is normal. There is no pericardial effusion.  Stable subcarinal soft  tissue prominence measuring 16 mm in short axis with ill defined margins extending along the right mainstem bronchus, bronchus intermedius and around the left mainstem bronchus unchanged in the prior exam. There is a stable 10 mm left hilar lymph node.  There is centrilobular emphysema. There is scarring and volume loss in the right middle lobe. There is a new spiculated 16 x 13 mm airspace opacity in the right upper lobe and in adjacent smaller spiculated 7 mm nodular opacity. There is a new subpleural 10 x 14 mm spiculated nodular opacity in the right lower lobe. There is no pleural effusion or pneumothorax.  There is no lytic or blastic osseous lesion.  The visualized portions of the upper abdomen are unremarkable.  Review of the MIP images confirms the above findings.  IMPRESSION: 1. New spiculated airspace opacity in the right upper lobe and right lower lobe most concerning for multilobar pneumonia. Recurrent malignancy is considered much less likely. Attention on follow-up examinations is recommended. 2. No evidence of pulmonary embolus. 3. Stable subcarinal soft tissue prominence.   Electronically Signed   By: Kathreen Devoid   On: 03/15/2015 12:57   Dg Chest Port 1 View  03/15/2015   CLINICAL DATA:  Chest pain, shortness of breath  EXAM: PORTABLE CHEST - 1 VIEW  COMPARISON:  CT chest 12/23/2014  FINDINGS: The heart size and mediastinal contours are within normal limits. Both lungs are clear. The visualized skeletal structures are unremarkable.  IMPRESSION: No active disease.   Electronically Signed   By: Kathreen Devoid   On: 03/15/2015 11:36     EKG Interpretation   Date/Time:  Monday March 15 2015 10:11:52 EDT Ventricular Rate:  111 PR Interval:  140 QRS Duration: 78 QT Interval:  337 QTC Calculation: 458 R Axis:   85 Text Interpretation:  Atrial-paced complexes Biatrial enlargement ST  elevation suggests acute pericarditis agree. No STEMI Confirmed by  Johnney Killian, MD, Jeannie Done [phone]) on 03/15/2015  1:29:20 PM     CRITICAL CARE Performed by: Charlesetta Shanks   Total critical care time: 45  Critical care time was exclusive of separately billable procedures and treating other patients.  Critical care was necessary to treat or prevent imminent or life-threatening deterioration.  Critical care was time  spent personally by me on the following activities: development of treatment plan with patient and/or surrogate as well as nursing, discussions with consultants, evaluation of patient's response to treatment, examination of patient, obtaining history from patient or surrogate, ordering and performing treatments and interventions, ordering and review of laboratory studies, ordering and review of radiographic studies, pulse oximetry and re-evaluation of patient's condition. MDM   Final diagnoses:  Healthcare-associated pneumonia  Malignant neoplasm of lung, unspecified laterality, unspecified part of lung  SIRS (systemic inflammatory response syndrome)   At this time PE has been ruled out. The patient does have consolidations on CT that are most suggestive per radiology of pneumonia. She arrives sob and tachycardic. The patient will be admitted for ongoing inpatient treatment of pneumonia with history of lung cancer. Sepsis protocol was initiated based on patient's initial presentation.   Charlesetta Shanks, MD 03/15/15 [phone]

## 2015-03-15 NOTE — Progress Notes (Signed)
ANTICOAGULATION CONSULT NOTE - Initial Consult  Pharmacy Consult for Xarelto Indication: atrial fibrillation  Allergies  Allergen Reactions  . Codeine Nausea And Vomiting  . Iodinated Diagnostic Agents Hives and Rash  . Sulfa Antibiotics Hives and Rash    Patient Measurements: Height: 4\' 11"  (149.9 cm) Weight: 108 lb 1.6 oz (49.034 kg) IBW/kg (Calculated) : 43.2  Vital Signs: Temp: 97.9 F (36.6 C) (04/04 1500) Temp Source: Oral (04/04 1500) BP: 145/73 mmHg (04/04 1500) Pulse Rate: 94 (04/04 1500)  Labs:  Recent Labs  03/15/15 1100  HGB 18.1*  HCT 53.6*  PLT 173  LABPROT 14.7  INR 1.14  CREATININE 0.70    Estimated Creatinine Clearance: 54.8 mL/min (by C-G formula based on Cr of 0.7).   Medical History: Past Medical History  Diagnosis Date  . Hypertension   . Allergy   . Asthma   . Anxiety   . Prophylactic measure 08/03/14-08/19/14    Prophyl. cranial radiation 24 Gy  . Small cell lung cancer 03/16/2014    Medications:  Scheduled:  . antiseptic oral rinse  7 mL Mouth Rinse q12n4p  . atorvastatin  20 mg Oral Daily  . azithromycin  500 mg Intravenous Q24H  . cefTRIAXone (ROCEPHIN)  IV  1 g Intravenous Q24H  . chlorhexidine  15 mL Mouth Rinse BID  . diltiazem  120 mg Oral Daily  . ipratropium-albuterol  3 mL Nebulization Q4H  . lisinopril  10 mg Oral Daily  . metoprolol succinate  50 mg Oral Daily   Infusions:  . sodium chloride 75 mL/hr at 03/15/15 1540    Assessment: 63 yoF admitted 4/4 with CAP and lung cancer now in remission.  CT (4/4) was negative for PE.  PMH includes paroxysmal atrial fibrillation, but not started on anticoagulation prior to admission.  CHA2DS2-VASc score = 2.  Pharmacy is now consulted to dose Xarelto.   SCr 0.7, CrCl ~ 55 ml/min  CBC: Hgb 18.1, Plt 173  Drug-drug interactions: Diltiazem (P-gp and moderate CYP3A4 inhibitor) increase exposure to Xarelto and may increase bleeding risk.  Results from the ROCKET AF trial, did  not show an increase in bleeding.  Goal of Therapy:  Appropriate renal dosing   Plan:   Xarelto 20mg  daily with supper  Follow up renal function and CBC.   Gretta Arab PharmD, BCPS Pager [phone] 03/15/2015 4:40 PM

## 2015-03-16 ENCOUNTER — Inpatient Hospital Stay (HOSPITAL_COMMUNITY): Payer: BLUE CROSS/BLUE SHIELD

## 2015-03-16 DIAGNOSIS — C3491 Malignant neoplasm of unspecified part of right bronchus or lung: Secondary | ICD-10-CM

## 2015-03-16 DIAGNOSIS — I480 Paroxysmal atrial fibrillation: Secondary | ICD-10-CM

## 2015-03-16 LAB — BASIC METABOLIC PANEL
Anion gap: 9 (ref 5–15)
BUN: 11 mg/dL (ref 6–23)
CHLORIDE: 106 mmol/L (ref 96–112)
CO2: 23 mmol/L (ref 19–32)
Calcium: 9.3 mg/dL (ref 8.4–10.5)
Creatinine, Ser: 0.62 mg/dL (ref 0.50–1.10)
GFR calc Af Amer: >90 mL/min (ref >90)
GLUCOSE: 179 mg/dL — AB (ref 70–99)
Potassium: 3.9 mmol/L (ref 3.5–5.1)
SODIUM: 138 mmol/L (ref 135–145)

## 2015-03-16 LAB — URINE CULTURE
Colony Count: NO GROWTH
Culture: NO GROWTH

## 2015-03-16 LAB — CBC
HCT: 48.1 % — ABNORMAL HIGH (ref 36.0–46.0)
Hemoglobin: 16.1 g/dL — ABNORMAL HIGH (ref 12.0–15.0)
MCH: 34.2 pg — AB (ref 26.0–34.0)
MCHC: 33.5 g/dL (ref 30.0–36.0)
MCV: 102.1 fL — AB (ref 78.0–100.0)
Platelets: 149 10*3/uL — ABNORMAL LOW (ref 150–400)
RBC: 4.71 MIL/uL (ref 3.87–5.11)
RDW: 21.3 % — AB (ref 11.5–15.5)
WBC: 5.8 10*3/uL (ref 4.0–10.5)

## 2015-03-16 LAB — HIV ANTIBODY (ROUTINE TESTING W REFLEX): HIV Screen 4th Generation wRfx: NONREACTIVE

## 2015-03-16 LAB — INFLUENZA PANEL BY PCR (TYPE A & B)
H1N1FLUPCR: NOT DETECTED
INFLAPCR: NEGATIVE
Influenza B By PCR: NEGATIVE

## 2015-03-16 LAB — STREP PNEUMONIAE URINARY ANTIGEN: Strep Pneumo Urinary Antigen: NEGATIVE

## 2015-03-16 LAB — TROPONIN I: Troponin I: <0.03 ng/mL (ref <0.031)

## 2015-03-16 MED ORDER — DIPHENHYDRAMINE HCL 50 MG/ML IJ SOLN
25.0000 mg | Freq: Once | INTRAMUSCULAR | Status: AC
  Administered 2015-03-16: 25 mg via INTRAVENOUS
  Filled 2015-03-16: qty 1

## 2015-03-16 MED ORDER — DIPHENHYDRAMINE HCL 25 MG PO CAPS
25.0000 mg | ORAL_CAPSULE | ORAL | Status: DC | PRN
Start: 2015-03-16 — End: 2015-03-18

## 2015-03-16 MED ORDER — BENZONATATE 100 MG PO CAPS
100.0000 mg | ORAL_CAPSULE | Freq: Three times a day (TID) | ORAL | Status: DC | PRN
  Administered 2015-03-16 – 2015-03-17 (×5): 100 mg via ORAL
  Filled 2015-03-16 (×5): qty 1

## 2015-03-16 NOTE — Progress Notes (Signed)
UR complete 

## 2015-03-16 NOTE — Progress Notes (Signed)
[facility] PROGRESS NOTE  Brittany [name] TMA:[id number] DOB: July 27, 1961 DOA: 03/15/2015 PCP: Pcp Not In System  Interim Summary Patient is a pleasant 89 old female with a past medical history of limited stage small cell lung cancer who was admitted to the medicine service on 03/15/2015. She presented with complaints of cough and shortness of breath as CT scan of lungs revealed airspace opacity in the right upper lobe and right lower lobe. She was started on empiric IV antibiotic therapy for community-acquired pneumonia with IV ceftriaxone and azithromycin. By the following day she reported feeling better. Repeat chest x-ray revealed findings consistent with COPD and perihilar/infrahilar interstitial infiltrate on the right likely reflecting pneumonia. Blood culture showing no growth to date.                                                              Assessment/Plan: 1. Community acquire pneumonia. -Patient presenting with clinical signs and symptoms consistent with pneumonia, with CT scan of lungs showing airspace opacity in the right upper lobe and right lower lobe. She was started on empiric IV antibiotic therapy with ceftriaxone and azithromycin. -Showing clinical improvement -Blood culture showing no growth to date -Flu swab pending -Will continue one more day of IV antimicrobial therapy, if continues to improve can likely be transitioned to oral antibiotic therapy in a.m. -Patient will need repeat scan in the [facility], case was discussed with Dr. Julien Nordmann  2.  History of limited stage small cell lung cancer. -She completed systemic chemotherapy with cisplatin and etoposide 6 cycles and radiation therapy last year -Dr Julien Nordmann was made aware of the patient's hospitalization. -Will need repeat scans in the [place] after discharge.  3.  History of paroxysmal atrial fibrillation, CHADSVasc score of 2 -Cardiology had recommended anticoagulation last year however was  not started on it due to newly diagnosed mass and possibility of invasive procedures, and was lost to follow up.  -I discussed anticoagulation with her during this hospitalization, we went over risks and benefits and previous recommendations by cardiology. She elected to initiate Xarelto therapy. Will need to follow-up with the cardiology clinic with Dr Harl Bowie on hospital follow-up. -Continue Cardizem 120 mg by mouth daily and metoprolol 50 mg by mouth daily  4.  Urinary tract infection. -UA showing presence of many bacteria and WBC's.  -She is on ceftriaxone, follow-up on urine cultures.  Code Status: Full code Family Communication: I spoke to her sister was present at bedside Disposition Plan: At speed discharge home when medically stable   Antibiotics:  Ceftriaxone 1 g IV every 24 hours started on 03/15/2015  Azithromycin 500 mg IV every 24 hours started on 03/15/2015  HPI/Subjective: Patient states feeling much better today, "breathing easier" tolerating by mouth intake, and ambulating around her room.  Objective: Filed Vitals:   03/16/15 1325  BP: 123/78  Pulse: 67  Temp: 97.4 F (36.3 C)  Resp: 20    Intake/Output Summary (Last 24 hours) at 03/16/15 1646 Last data filed at 03/16/15 1500  Gross per 24 hour  Intake   2575 ml  Output    100 ml  Net   2475 ml   Filed Weights   03/15/15 1024 03/15/15 1500  Weight: 52 kg (114 lb 10.2 oz) 49.034 kg (108 lb 1.6 oz)  Exam:   General:  Appears better, awake, alert, following commands  Cardiovascular: Regular rate and rhythm normal S1-S2  Respiratory: Improved lung sounds, normal respiratory effort, overall clear to auscultation bilaterally without wheezing rhonchi rales  Abdomen: Soft nontender nondistended  Musculoskeletal: No edema   Data Reviewed: Basic Metabolic Panel:  Recent Labs Lab 03/15/15 1100 03/16/15 0235  NA 139 138  K 3.7 3.9  CL 100 106  CO2 23 23  GLUCOSE 119* 179*  BUN 17 11   CREATININE 0.70 0.62  CALCIUM 9.9 9.3   Liver Function Tests:  Recent Labs Lab 03/15/15 1100  AST 18  ALT 15  ALKPHOS 93  BILITOT 1.2  PROT 8.4*  ALBUMIN 4.3    Recent Labs Lab 03/15/15 1100  LIPASE 16   No results for input(s): AMMONIA in the last 168 hours. CBC:  Recent Labs Lab 03/15/15 1100 03/16/15 0235  WBC 6.3 5.8  HGB 18.1* 16.1*  HCT 53.6* 48.1*  MCV 100.2* 102.1*  PLT 173 149*   Cardiac Enzymes:  Recent Labs Lab 03/15/15 1520 03/15/15 2035 03/16/15 0235  TROPONINI <0.03 <0.03 <0.03   BNP (last 3 results)  Recent Labs  03/15/15 1100  BNP 58.8    ProBNP (last 3 results) No results for input(s): PROBNP in the last 8760 hours.  CBG: No results for input(s): GLUCAP in the last 168 hours.  Recent Results (from the past 240 hour(s))  Blood Culture (routine x 2)     Status: None (Preliminary result)   Collection Time: 03/15/15 11:01 AM  Result Value Ref Range Status   Specimen Description BLOOD RIGHT FOREARM  Final   Special Requests BOTTLES DRAWN AEROBIC AND ANAEROBIC 4CC  Final   Culture   Final           BLOOD CULTURE RECEIVED NO GROWTH TO DATE CULTURE WILL BE HELD FOR 5 DAYS BEFORE ISSUING A FINAL NEGATIVE REPORT Performed at [facility]    Report Status PENDING  Incomplete  Blood Culture (routine x 2)     Status: None (Preliminary result)   Collection Time: 03/15/15 11:03 AM  Result Value Ref Range Status   Specimen Description BLOOD LEFT ANTECUBITAL  Final   Special Requests BOTTLES DRAWN AEROBIC AND ANAEROBIC 4CC  Final   Culture   Final           BLOOD CULTURE RECEIVED NO GROWTH TO DATE CULTURE WILL BE HELD FOR 5 DAYS BEFORE ISSUING A FINAL NEGATIVE REPORT Performed at [facility]    Report Status PENDING  Incomplete  Urine culture     Status: None   Collection Time: 03/15/15 11:17 AM  Result Value Ref Range Status   Specimen Description URINE, CATHETERIZED  Final   Special Requests NONE  Final   Colony  Count NO GROWTH Performed at [facility]   Final   Culture NO GROWTH Performed at [facility]   Final   Report Status 03/16/2015 FINAL  Final  Culture, blood (routine x 2) Call MD if unable to obtain prior to antibiotics being given     Status: None (Preliminary result)   Collection Time: 03/15/15  4:17 PM  Result Value Ref Range Status   Specimen Description BLOOD LEFT ARM  Final   Special Requests AEB 5CC  Final   Culture   Final           BLOOD CULTURE RECEIVED NO GROWTH TO DATE CULTURE WILL BE HELD FOR 5 DAYS BEFORE ISSUING  A FINAL NEGATIVE REPORT Performed at [facility]    Report Status PENDING  Incomplete  Culture, blood (routine x 2) Call MD if unable to obtain prior to antibiotics being given     Status: None (Preliminary result)   Collection Time: 03/15/15  4:23 PM  Result Value Ref Range Status   Specimen Description BLOOD LEFT HAND  Final   Special Requests BAA 5CC  Final   Culture   Final           BLOOD CULTURE RECEIVED NO GROWTH TO DATE CULTURE WILL BE HELD FOR 5 DAYS BEFORE ISSUING A FINAL NEGATIVE REPORT Performed at [facility]    Report Status PENDING  Incomplete     Studies: Dg Chest 2 View  03/16/2015   CLINICAL DATA:  Recent onset of productive cough and shortness of breath; history of lung malignancy in remission; history of previous tobacco use.  EXAM: CHEST  2 VIEW  COMPARISON:  Portable chest x-ray of March 15, 2015  FINDINGS: The lungs are hyperinflated with hemidiaphragm flattening. The left lung is clear. There is persistent increased interstitial density in the right perihilar and infrahilar regions. There is no pleural effusion. The cardiac silhouette is top-normal in size. The mediastinum is normal in width. There is no pleural effusion. The bony thorax is unremarkable.  IMPRESSION: COPD. Perihilar and infrahilar interstitial infiltrate on the right likely reflecting pneumonia.   Electronically Signed   By: David  Martinique    On: 03/16/2015 09:24   Ct Angio Chest Pe W/cm &/or Wo Cm  03/15/2015   CLINICAL DATA:  Shortness of breath, history of lung cancer, history of chemotherapy and radiation therapy  EXAM: CT ANGIOGRAPHY CHEST WITH CONTRAST  TECHNIQUE: Multidetector CT imaging of the chest was performed using the standard protocol during bolus administration of intravenous contrast. Multiplanar CT image reconstructions and MIPs were obtained to evaluate the vascular anatomy.  CONTRAST:  115mL OMNIPAQUE IOHEXOL 350 MG/ML SOLN  COMPARISON:  12/23/2014, 09/22/2014, 06/05/2014  FINDINGS: There is adequate opacification of the pulmonary arteries. There is no pulmonary embolus. The main pulmonary artery, right main pulmonary artery and left main pulmonary arteries are normal in size. The heart size is normal. There is no pericardial effusion.  Stable subcarinal soft tissue prominence measuring 16 mm in short axis with ill defined margins extending along the right mainstem bronchus, bronchus intermedius and around the left mainstem bronchus unchanged in the prior exam. There is a stable 10 mm left hilar lymph node.  There is centrilobular emphysema. There is scarring and volume loss in the right middle lobe. There is a new spiculated 16 x 13 mm airspace opacity in the right upper lobe and in adjacent smaller spiculated 7 mm nodular opacity. There is a new subpleural 10 x 14 mm spiculated nodular opacity in the right lower lobe. There is no pleural effusion or pneumothorax.  There is no lytic or blastic osseous lesion.  The visualized portions of the upper abdomen are unremarkable.  Review of the MIP images confirms the above findings.  IMPRESSION: 1. New spiculated airspace opacity in the right upper lobe and right lower lobe most concerning for multilobar pneumonia. Recurrent malignancy is considered much less likely. Attention on follow-up examinations is recommended. 2. No evidence of pulmonary embolus. 3. Stable subcarinal soft tissue  prominence.   Electronically Signed   By: Kathreen Devoid   On: 03/15/2015 12:57   Dg Chest Port 1 View  03/15/2015   CLINICAL DATA:  Chest pain, shortness of breath  EXAM: PORTABLE CHEST - 1 VIEW  COMPARISON:  CT chest 12/23/2014  FINDINGS: The heart size and mediastinal contours are within normal limits. Both lungs are clear. The visualized skeletal structures are unremarkable.  IMPRESSION: No active disease.   Electronically Signed   By: Kathreen Devoid   On: 03/15/2015 11:36    Scheduled Meds: . antiseptic oral rinse  7 mL Mouth Rinse q12n4p  . atorvastatin  20 mg Oral Daily  . azithromycin  500 mg Intravenous Q24H  . cefTRIAXone (ROCEPHIN)  IV  1 g Intravenous Q24H  . chlorhexidine  15 mL Mouth Rinse BID  . diltiazem  120 mg Oral Daily  . ipratropium-albuterol  3 mL Nebulization TID  . lisinopril  10 mg Oral Daily  . metoprolol succinate  50 mg Oral Daily  . rivaroxaban  20 mg Oral Q supper   Continuous Infusions: . sodium chloride 75 mL/hr at 03/16/15 5883    Principal Problem:   CAP (community acquired pneumonia) Active Problems:   Chest pain   HTN (hypertension)   Atrial fibrillation   Small cell lung cancer   Pneumonia    Time spent: 35 min    Kelvin Cellar  [facility] Pager [phone]. If 7PM-7AM, please contact night-coverage at [email or web address], password [facility] 03/16/2015, 4:46 PM  LOS: 1 day

## 2015-03-16 NOTE — Discharge Instructions (Signed)
Information on my medicine - XARELTO (Rivaroxaban)  This medication education was reviewed with me or my healthcare representative as part of my discharge preparation.  The pharmacist that spoke with me during my hospital stay was:  Clovis Riley, [facility]  Why was Xarelto prescribed for you? Xarelto was prescribed for you to reduce the risk of a blood clot forming that can cause a stroke if you have a medical condition called atrial fibrillation (a type of irregular heartbeat).  What do you need to know about xarelto ? Take your Xarelto ONCE DAILY at the same time every day with your evening meal. If you have difficulty swallowing the tablet whole, you may crush it and mix in applesauce just prior to taking your dose.  Take Xarelto exactly as prescribed by your doctor and DO NOT stop taking Xarelto without talking to the doctor who prescribed the medication.  Stopping without other stroke prevention medication to take the place of Xarelto may increase your risk of developing a clot that causes a stroke.  Refill your prescription before you run out.  After discharge, you should have regular check-up appointments with your healthcare provider that is prescribing your Xarelto.  In the future your dose may need to be changed if your kidney function or weight changes by a significant amount.  What do you do if you miss a dose? If you are taking Xarelto ONCE DAILY and you miss a dose, take it as soon as you remember on the same day then continue your regularly scheduled once daily regimen the next day. Do not take two doses of Xarelto at the same time or on the same day.   Important Safety Information A possible side effect of Xarelto is bleeding. You should call your healthcare provider right away if you experience any of the following: ? Bleeding from an injury or your nose that does not stop. ? Unusual colored urine (red or dark brown) or unusual colored stools (red or  black). ? Unusual bruising for unknown reasons. ? A serious fall or if you hit your head (even if there is no bleeding).  Some medicines may interact with Xarelto and might increase your risk of bleeding while on Xarelto. To help avoid this, consult your healthcare provider or pharmacist prior to using any new prescription or non-prescription medications, including herbals, vitamins, non-steroidal anti-inflammatory drugs (NSAIDs) and supplements.  This website has more information on Xarelto: [email or web address]

## 2015-03-17 LAB — BASIC METABOLIC PANEL
Anion gap: 7 (ref 5–15)
BUN: 8 mg/dL (ref 6–23)
CO2: 24 mmol/L (ref 19–32)
Calcium: 9 mg/dL (ref 8.4–10.5)
Chloride: 109 mmol/L (ref 96–112)
Creatinine, Ser: 0.52 mg/dL (ref 0.50–1.10)
GFR calc Af Amer: >90 mL/min (ref >90)
GFR calc non Af Amer: >90 mL/min (ref >90)
GLUCOSE: 120 mg/dL — AB (ref 70–99)
POTASSIUM: 3.8 mmol/L (ref 3.5–5.1)
Sodium: 140 mmol/L (ref 135–145)

## 2015-03-17 LAB — LEGIONELLA ANTIGEN, URINE

## 2015-03-17 LAB — CBC
HCT: 44.4 % (ref 36.0–46.0)
Hemoglobin: 14.1 g/dL (ref 12.0–15.0)
MCH: 32.6 pg (ref 26.0–34.0)
MCHC: 31.8 g/dL (ref 30.0–36.0)
MCV: 102.8 fL — AB (ref 78.0–100.0)
Platelets: 182 10*3/uL (ref 150–400)
RBC: 4.32 MIL/uL (ref 3.87–5.11)
RDW: 21.1 % — AB (ref 11.5–15.5)
WBC: 7.4 10*3/uL (ref 4.0–10.5)

## 2015-03-17 NOTE — Progress Notes (Signed)
[facility]                                                                              Patient Demographics  Brittany [name], is a 54 y.o. female, DOB - 09/01/1961, QIW:[id number]  Admit date - 03/15/2015   Admitting Physician Kelvin Cellar, MD  Outpatient Primary MD for the patient is Pcp Not In System  LOS - 2   Chief Complaint  Patient presents with  . Chest Pain       Brief HPI   Patient is a pleasant 57 old female with a past medical history of limited stage small cell lung cancer who was admitted to the medicine service on 03/15/2015. She presented with complaints of cough and shortness of breath as CT scan of lungs revealed airspace opacity in the right upper lobe and right lower lobe. She was started on empiric IV antibiotic therapy for community-acquired pneumonia with IV ceftriaxone and azithromycin. By the following day she reported feeling better. Repeat chest x-ray revealed findings consistent with COPD and perihilar/infrahilar interstitial infiltrate on the right likely reflecting pneumonia. Blood culture showing no growth to date.   Assessment & Plan    Principal Problem:   CAP (community acquired pneumonia) - CT of the lungs showed airspace opacities in the right upper lobe and right lower lobe. Patient started on IV antibiotic therapy with ceftriaxone and azithromycin. - Influenza panel negative - Urine strep antigen negative - Blood cultures negative so far  Active Problems: Small cell lung cancer - completed systemic chemotherapy with cisplatin and etoposide 6 cycles and radiation therapy last year, Dr Julien Nordmann was made aware of the patient's hospitalization. -Will need repeat scans in the [place] after discharge.    HTN (hypertension) - Currently stable    Atrial fibrillation -Cardiology had recommended anticoagulation last year however was not started on it due to newly diagnosed mass and possibility of  invasive procedures, and was lost to follow up. [facility] Vasc 2 -Dr Coralyn Pear discussed anticoagulation with her and was explained risks and benefits and previous recommendations by cardiology. She elected to initiate Xarelto therapy. Will need to follow-up with the cardiology clinic with Dr Harl Bowie on hospital follow-up. -Continue Cardizem 120 mg by mouth daily and metoprolol 50 mg by mouth daily - Continue xarelto  ? UTI - Urine culture showed no growth   Code Status: Full code  Family Communication: Discussed in detail with the patient, all imaging results, lab results explained to the patient    Disposition Plan: Hopefully tomorrow  Time Spent in minutes   25 minutes  Procedures  none  Consults   none  DVT Prophylaxis  xarelto Medications  Scheduled Meds: . antiseptic oral rinse  7 mL Mouth Rinse q12n4p  . atorvastatin  20 mg Oral Daily  . azithromycin  500 mg Intravenous Q24H  . cefTRIAXone (ROCEPHIN)  IV  1 g Intravenous Q24H  . chlorhexidine  15 mL Mouth Rinse BID  . diltiazem  120 mg Oral Daily  . ipratropium-albuterol  3 mL Nebulization TID  . lisinopril  10 mg Oral Daily  . metoprolol succinate  50 mg Oral Daily  .  rivaroxaban  20 mg Oral Q supper   Continuous Infusions: . sodium chloride 75 mL/hr at 03/17/15 0214   PRN Meds:.albuterol, benzonatate, diphenhydrAMINE, guaiFENesin, morphine injection, ondansetron (ZOFRAN) IV, oxyCODONE   Antibiotics   Anti-infectives    Start     Dose/Rate Route Frequency Ordered Stop   03/16/15 0000  vancomycin (VANCOCIN) IVPB 750 mg/150 ml premix  Status:  Discontinued     750 mg 150 mL/hr over 60 Minutes Intravenous Every 12 hours 03/15/15 1215 03/15/15 1501   03/15/15 2000  ceFEPIme (MAXIPIME) 1 g in dextrose 5 % 50 mL IVPB  Status:  Discontinued     1 g 100 mL/hr over 30 Minutes Intravenous Every 8 hours 03/15/15 1215 03/15/15 1501   03/15/15 1800  cefTRIAXone (ROCEPHIN) 1 g in dextrose 5 % 50 mL IVPB - Premix     1  g 100 mL/hr over 30 Minutes Intravenous Every 24 hours 03/15/15 1501 03/22/15 1759   03/15/15 1700  azithromycin (ZITHROMAX) 500 mg in dextrose 5 % 250 mL IVPB     500 mg 250 mL/hr over 60 Minutes Intravenous Every 24 hours 03/15/15 1501 03/22/15 1659   03/15/15 1045  ceFEPIme (MAXIPIME) 2 g in dextrose 5 % 50 mL IVPB     2 g 100 mL/hr over 30 Minutes Intravenous  Once 03/15/15 1040 03/15/15 1246   03/15/15 1045  vancomycin (VANCOCIN) IVPB 1000 mg/200 mL premix     1,000 mg 200 mL/hr over 60 Minutes Intravenous  Once 03/15/15 1040 03/15/15 1246        Subjective:   Brittany [name] was seen and examined today. Feeling not too well today, although improving from admission. No fevers or chills, no chest pain, feels a little bit congested today  Objective:   Blood pressure 134/70, pulse 64, temperature 98.2 F (36.8 C), temperature source Oral, resp. rate 18, height 4\' 11"  (1.499 m), weight 49.034 kg (108 lb 1.6 oz), SpO2 97 %.  Wt Readings from Last 3 Encounters:  03/15/15 49.034 kg (108 lb 1.6 oz)  12/31/14 52.436 kg (115 lb 9.6 oz)  09/24/14 54.023 kg (119 lb 1.6 oz)     Intake/Output Summary (Last 24 hours) at 03/17/15 1255 Last data filed at 03/17/15 0551  Gross per 24 hour  Intake 2178.75 ml  Output      0 ml  Net 2178.75 ml    Exam  General: Alert and oriented x 3, NAD  HEENT:  PERRLA, EOMI, Anicteic Sclera, mucous membranes moist.   Neck: Supple, no JVD, no masses  CVS: S1 S2 auscultated, no rubs, murmurs or gallops. Regular rate and rhythm.  Respiratory: Clear to auscultation bilaterally, no wheezing, rales or rhonchi, no significant wheezing  Abdomen: Soft, nontender, nondistended, + bowel sounds  Ext: no cyanosis clubbing or edema  Neuro: AAOx3, Cr N's II- XII. Strength 5/5 upper and lower extremities bilaterally  Skin: No rashes  Psych: Normal affect and demeanor, alert and oriented x3    Data Review   Micro Results Recent Results  (from the past 240 hour(s))  Blood Culture (routine x 2)     Status: None (Preliminary result)   Collection Time: 03/15/15 11:01 AM  Result Value Ref Range Status   Specimen Description BLOOD RIGHT FOREARM  Final   Special Requests BOTTLES DRAWN AEROBIC AND ANAEROBIC 4CC  Final   Culture   Final           BLOOD CULTURE RECEIVED NO GROWTH TO DATE CULTURE WILL BE HELD  FOR 5 DAYS BEFORE ISSUING A FINAL NEGATIVE REPORT Performed at [facility]    Report Status PENDING  Incomplete  Blood Culture (routine x 2)     Status: None (Preliminary result)   Collection Time: 03/15/15 11:03 AM  Result Value Ref Range Status   Specimen Description BLOOD LEFT ANTECUBITAL  Final   Special Requests BOTTLES DRAWN AEROBIC AND ANAEROBIC 4CC  Final   Culture   Final           BLOOD CULTURE RECEIVED NO GROWTH TO DATE CULTURE WILL BE HELD FOR 5 DAYS BEFORE ISSUING A FINAL NEGATIVE REPORT Performed at [facility]    Report Status PENDING  Incomplete  Urine culture     Status: None   Collection Time: 03/15/15 11:17 AM  Result Value Ref Range Status   Specimen Description URINE, CATHETERIZED  Final   Special Requests NONE  Final   Colony Count NO GROWTH Performed at [facility]   Final   Culture NO GROWTH Performed at [facility]   Final   Report Status 03/16/2015 FINAL  Final  Culture, blood (routine x 2) Call MD if unable to obtain prior to antibiotics being given     Status: None (Preliminary result)   Collection Time: 03/15/15  4:17 PM  Result Value Ref Range Status   Specimen Description BLOOD LEFT ARM  Final   Special Requests AEB 5CC  Final   Culture   Final           BLOOD CULTURE RECEIVED NO GROWTH TO DATE CULTURE WILL BE HELD FOR 5 DAYS BEFORE ISSUING A FINAL NEGATIVE REPORT Performed at [facility]    Report Status PENDING  Incomplete  Culture, blood (routine x 2) Call MD if unable to obtain prior to antibiotics being given     Status: None  (Preliminary result)   Collection Time: 03/15/15  4:23 PM  Result Value Ref Range Status   Specimen Description BLOOD LEFT HAND  Final   Special Requests BAA 5CC  Final   Culture   Final           BLOOD CULTURE RECEIVED NO GROWTH TO DATE CULTURE WILL BE HELD FOR 5 DAYS BEFORE ISSUING A FINAL NEGATIVE REPORT Performed at [facility]    Report Status PENDING  Incomplete    Radiology Reports Dg Chest 2 View  03/16/2015   CLINICAL DATA:  Recent onset of productive cough and shortness of breath; history of lung malignancy in remission; history of previous tobacco use.  EXAM: CHEST  2 VIEW  COMPARISON:  Portable chest x-ray of March 15, 2015  FINDINGS: The lungs are hyperinflated with hemidiaphragm flattening. The left lung is clear. There is persistent increased interstitial density in the right perihilar and infrahilar regions. There is no pleural effusion. The cardiac silhouette is top-normal in size. The mediastinum is normal in width. There is no pleural effusion. The bony thorax is unremarkable.  IMPRESSION: COPD. Perihilar and infrahilar interstitial infiltrate on the right likely reflecting pneumonia.   Electronically Signed   By: David  Martinique   On: 03/16/2015 09:24   Ct Angio Chest Pe W/cm &/or Wo Cm  03/15/2015   CLINICAL DATA:  Shortness of breath, history of lung cancer, history of chemotherapy and radiation therapy  EXAM: CT ANGIOGRAPHY CHEST WITH CONTRAST  TECHNIQUE: Multidetector CT imaging of the chest was performed using the standard protocol during bolus administration of intravenous contrast. Multiplanar CT image reconstructions and  MIPs were obtained to evaluate the vascular anatomy.  CONTRAST:  176mL OMNIPAQUE IOHEXOL 350 MG/ML SOLN  COMPARISON:  12/23/2014, 09/22/2014, 06/05/2014  FINDINGS: There is adequate opacification of the pulmonary arteries. There is no pulmonary embolus. The main pulmonary artery, right main pulmonary artery and left main pulmonary arteries are normal  in size. The heart size is normal. There is no pericardial effusion.  Stable subcarinal soft tissue prominence measuring 16 mm in short axis with ill defined margins extending along the right mainstem bronchus, bronchus intermedius and around the left mainstem bronchus unchanged in the prior exam. There is a stable 10 mm left hilar lymph node.  There is centrilobular emphysema. There is scarring and volume loss in the right middle lobe. There is a new spiculated 16 x 13 mm airspace opacity in the right upper lobe and in adjacent smaller spiculated 7 mm nodular opacity. There is a new subpleural 10 x 14 mm spiculated nodular opacity in the right lower lobe. There is no pleural effusion or pneumothorax.  There is no lytic or blastic osseous lesion.  The visualized portions of the upper abdomen are unremarkable.  Review of the MIP images confirms the above findings.  IMPRESSION: 1. New spiculated airspace opacity in the right upper lobe and right lower lobe most concerning for multilobar pneumonia. Recurrent malignancy is considered much less likely. Attention on follow-up examinations is recommended. 2. No evidence of pulmonary embolus. 3. Stable subcarinal soft tissue prominence.   Electronically Signed   By: Kathreen Devoid   On: 03/15/2015 12:57   Dg Chest Port 1 View  03/15/2015   CLINICAL DATA:  Chest pain, shortness of breath  EXAM: PORTABLE CHEST - 1 VIEW  COMPARISON:  CT chest 12/23/2014  FINDINGS: The heart size and mediastinal contours are within normal limits. Both lungs are clear. The visualized skeletal structures are unremarkable.  IMPRESSION: No active disease.   Electronically Signed   By: Kathreen Devoid   On: 03/15/2015 11:36    CBC  Recent Labs Lab 03/15/15 1100 03/16/15 0235 03/17/15 0526  WBC 6.3 5.8 7.4  HGB 18.1* 16.1* 14.1  HCT 53.6* 48.1* 44.4  PLT 173 149* 182  MCV 100.2* 102.1* 102.8*  MCH 33.8 34.2* 32.6  MCHC 33.8 33.5 31.8  RDW 21.2* 21.3* 21.1*    Chemistries   Recent  Labs Lab 03/15/15 1100 03/16/15 0235 03/17/15 0526  NA 139 138 140  K 3.7 3.9 3.8  CL 100 106 109  CO2 23 23 24   GLUCOSE 119* 179* 120*  BUN 17 11 8   CREATININE 0.70 0.62 0.52  CALCIUM 9.9 9.3 9.0  AST 18  --   --   ALT 15  --   --   ALKPHOS 93  --   --   BILITOT 1.2  --   --    ------------------------------------------------------------------------------------------------------------------ estimated creatinine clearance is 54.8 mL/min (by C-G formula based on Cr of 0.52). ------------------------------------------------------------------------------------------------------------------ No results for input(s): HGBA1C in the last 72 hours. ------------------------------------------------------------------------------------------------------------------ No results for input(s): CHOL, HDL, LDLCALC, TRIG, CHOLHDL, LDLDIRECT in the last 72 hours. ------------------------------------------------------------------------------------------------------------------ No results for input(s): TSH, T4TOTAL, T3FREE, THYROIDAB in the last 72 hours.  Invalid input(s): FREET3 ------------------------------------------------------------------------------------------------------------------ No results for input(s): VITAMINB12, FOLATE, FERRITIN, TIBC, IRON, RETICCTPCT in the last 72 hours.  Coagulation profile  Recent Labs Lab 03/15/15 1100  INR 1.14    No results for input(s): DDIMER in the last 72 hours.  Cardiac Enzymes  Recent Labs Lab 03/15/15 1520 03/15/15 2035  03/16/15 0235  TROPONINI <0.03 <0.03 <0.03   ------------------------------------------------------------------------------------------------------------------ Invalid input(s): POCBNP  No results for input(s): GLUCAP in the last 72 hours.   Zackery Brine M.D. [facility] 03/17/2015, 12:55 PM  Pager: [phone]   Between 7am to 7pm - call Pager - [phone]  After 7pm go to [email or web address] - password  [email or web address]  Call night coverage person covering after 7pm

## 2015-03-18 DIAGNOSIS — R079 Chest pain, unspecified: Secondary | ICD-10-CM

## 2015-03-18 MED ORDER — BENZONATATE 100 MG PO CAPS: 100.0000 mg | ORAL_CAPSULE | Freq: Three times a day (TID) | ORAL | Status: DC | PRN

## 2015-03-18 MED ORDER — RIVAROXABAN 20 MG PO TABS: 20.0000 mg | ORAL_TABLET | Freq: Every day | ORAL | Status: DC

## 2015-03-18 MED ORDER — PROCHLORPERAZINE MALEATE 10 MG PO TABS: 10.0000 mg | ORAL_TABLET | Freq: Four times a day (QID) | ORAL | Status: DC | PRN

## 2015-03-18 MED ORDER — AZITHROMYCIN 500 MG PO TABS
500.0000 mg | ORAL_TABLET | Freq: Every day | ORAL | Status: DC
  Administered 2015-03-18: 500 mg via ORAL
  Filled 2015-03-18: qty 1

## 2015-03-18 MED ORDER — CEFUROXIME AXETIL 500 MG PO TABS: 500.0000 mg | ORAL_TABLET | Freq: Two times a day (BID) | ORAL | Status: DC

## 2015-03-18 MED ORDER — CEFUROXIME AXETIL 500 MG PO TABS
500.0000 mg | ORAL_TABLET | Freq: Two times a day (BID) | ORAL | Status: DC
  Administered 2015-03-18: 500 mg via ORAL
  Filled 2015-03-18 (×2): qty 1

## 2015-03-18 MED ORDER — AZITHROMYCIN 500 MG PO TABS: 500.0000 mg | ORAL_TABLET | Freq: Every day | ORAL | Status: DC

## 2015-03-18 NOTE — Discharge Summary (Signed)
Physician Discharge Summary   Patient ID: Sheanna Dail MRN: [id number] DOB/AGE: 12/26/1960 54 y.o.  Admit date: 03/15/2015 Discharge date: 03/18/2015  Primary Care Physician:  Pcp Not In System  Discharge Diagnoses:    . CAP (community acquired pneumonia) . Chest pain . Small cell lung cancer . HTN (hypertension) . Atrial fibrillation   Consults:  None    Recommendations for Outpatient Follow-up:  The patient was recommended to follow-up with cardiology, started on Xarelto due to atrial fibrillation. [facility] care office will call her with an appointment.  DIET: Heart healthy diet    Allergies:   Allergies  Allergen Reactions  . Codeine Nausea And Vomiting  . Iodinated Diagnostic Agents Hives and Rash  . Sulfa Antibiotics Hives and Rash     Discharge Medications:   Medication List    STOP taking these medications        predniSONE 50 MG tablet  Commonly known as:  DELTASONE      TAKE these medications        albuterol (2.5 MG/3ML) 0.083% nebulizer solution  Commonly known as:  PROVENTIL  Take 2.5 mg by nebulization every 6 (six) hours as needed for wheezing or shortness of breath (wheezing and shortness of breath).     albuterol (2.5 MG/3ML) 0.083% nebulizer solution  Commonly known as:  PROVENTIL  Take 3 mLs (2.5 mg total) by nebulization every 6 (six) hours as needed for wheezing.     albuterol 108 (90 BASE) MCG/ACT inhaler  Commonly known as:  PROVENTIL HFA  Inhale 2 puffs into the lungs every 4 (four) hours as needed for wheezing or shortness of breath.     atorvastatin 20 MG tablet  Commonly known as:  LIPITOR  Take 1 tablet (20 mg total) by mouth daily.     azithromycin 500 MG tablet  Commonly known as:  ZITHROMAX  Take 1 tablet (500 mg total) by mouth daily. X 4 more days     benzonatate 100 MG capsule  Commonly known as:  TESSALON  Take 1 capsule (100 mg total) by mouth 3 (three) times daily as needed for cough.     cefUROXime  500 MG tablet  Commonly known as:  CEFTIN  Take 1 tablet (500 mg total) by mouth 2 (two) times daily with a meal. X 4 more days     diltiazem 120 MG 24 hr capsule  Commonly known as:  CARDIZEM CD  Take 1 capsule (120 mg total) by mouth daily.     feeding supplement (RESOURCE BREEZE) Liqd  Take 1 Container by mouth 2 (two) times daily between meals.     guaiFENesin 600 MG 12 hr tablet  Commonly known as:  MUCINEX  Take 600 mg by mouth 2 (two) times daily as needed for cough (cough).     lisinopril 10 MG tablet  Commonly known as:  PRINIVIL,ZESTRIL  Take 10 mg by mouth daily.     metoprolol succinate 50 MG 24 hr tablet  Commonly known as:  TOPROL-XL  Take 1 tablet (50 mg total) by mouth daily. Take with or immediately following a meal.     prochlorperazine 10 MG tablet  Commonly known as:  COMPAZINE  Take 1 tablet (10 mg total) by mouth every 6 (six) hours as needed for nausea or vomiting.     rivaroxaban 20 MG Tabs tablet  Commonly known as:  XARELTO  Take 1 tablet (20 mg total) by mouth daily with supper.     triamcinolone  55 MCG/ACT Aero nasal inhaler  Commonly known as:  NASACORT  Place 2 sprays into both nostrils daily.         Brief H and P: For complete details please refer to admission H and P, but in brief Patient is a pleasant 54 old female with a past medical history of limited stage small cell lung cancer who was admitted to the medicine service on 03/15/2015. She presented with complaints of cough and shortness of breath as CT scan of lungs revealed airspace opacity in the right upper lobe and right lower lobe. She was started on empiric IV antibiotic therapy for community-acquired pneumonia with IV ceftriaxone and azithromycin. By the following day she reported feeling better. Repeat chest x-ray revealed findings consistent with COPD and perihilar/infrahilar interstitial infiltrate on the right likely reflecting pneumonia. Blood culture showing no growth to  date.  Hospital Course:   CAP (community acquired pneumonia) - CT of the lungs showed airspace opacities in the right upper lobe and right lower lobe. Patient was started on IV antibiotic therapy with ceftriaxone and azithromycin. Blood cultures negative so far, influenza panel was negative, urine strep antigen negative and urine legionella antigen negative. Patient was transitioned to oral antibiotics to complete full course for 7 days.  Small cell lung cancer - completed systemic chemotherapy with cisplatin and etoposide 6 cycles and radiation therapy last year, Dr Julien Nordmann was made aware of the patient's hospitalization. -Will need repeat scans in the [place] after discharge, the scans are ordered outpatient on 4/21.   HTN (hypertension) - Currently stable   Atrial fibrillation -Cardiology had recommended anticoagulation last year however was not started on it due to newly diagnosed mass and possibility of invasive procedures, and was lost to follow up. CHADS Vasc 2 -Dr Coralyn Pear discussed anticoagulation with her and was explained risks and benefits and previous recommendations by cardiology. She elected to initiate Xarelto therapy. Will need to follow-up with the cardiology clinic, patient lives in [place]. I called [place] office and spoke with theiroffice manager, [facility] will call patient for appointment.  -Continue Cardizem 120 mg by mouth daily and metoprolol 50 mg by mouth daily - Continue xarelto   Day of Discharge BP 150/82 mmHg  Pulse 80  Temp(Src) 98.5 F (36.9 C) (Oral)  Resp 18  Ht 4\' 11"  (1.499 m)  Wt 49.034 kg (108 lb 1.6 oz)  BMI 21.82 kg/m2  SpO2 94%  Physical Exam: General: Alert and awake oriented x3 not in any acute distress. HEENT: anicteric sclera, pupils reactive to light and accommodation CVS: S1-S2 clear no murmur rubs or gallops Chest: clear to auscultation bilaterally, no wheezing rales or rhonchi Abdomen: soft nontender, nondistended, normal  bowel sounds Extremities: no cyanosis, clubbing or edema noted bilaterally Neuro: Cranial nerves II-XII intact, no focal neurological deficits   The results of significant diagnostics from this hospitalization (including imaging, microbiology, ancillary and laboratory) are listed below for reference.    LAB RESULTS: Basic Metabolic Panel:  Recent Labs Lab 03/16/15 0235 03/17/15 0526  NA 138 140  K 3.9 3.8  CL 106 109  CO2 23 24  GLUCOSE 179* 120*  BUN 11 8  CREATININE 0.62 0.52  CALCIUM 9.3 9.0   Liver Function Tests:  Recent Labs Lab 03/15/15 1100  AST 18  ALT 15  ALKPHOS 93  BILITOT 1.2  PROT 8.4*  ALBUMIN 4.3    Recent Labs Lab 03/15/15 1100  LIPASE 16   No results for input(s): AMMONIA in the last  168 hours. CBC:  Recent Labs Lab 03/16/15 0235 03/17/15 0526  WBC 5.8 7.4  HGB 16.1* 14.1  HCT 48.1* 44.4  MCV 102.1* 102.8*  PLT 149* 182   Cardiac Enzymes:  Recent Labs Lab 03/15/15 2035 03/16/15 0235  TROPONINI <0.03 <0.03   BNP: Invalid input(s): POCBNP CBG: No results for input(s): GLUCAP in the last 168 hours.  Significant Diagnostic Studies:  Dg Chest 2 View  03/16/2015   CLINICAL DATA:  Recent onset of productive cough and shortness of breath; history of lung malignancy in remission; history of previous tobacco use.  EXAM: CHEST  2 VIEW  COMPARISON:  Portable chest x-ray of March 15, 2015  FINDINGS: The lungs are hyperinflated with hemidiaphragm flattening. The left lung is clear. There is persistent increased interstitial density in the right perihilar and infrahilar regions. There is no pleural effusion. The cardiac silhouette is top-normal in size. The mediastinum is normal in width. There is no pleural effusion. The bony thorax is unremarkable.  IMPRESSION: COPD. Perihilar and infrahilar interstitial infiltrate on the right likely reflecting pneumonia.   Electronically Signed   By: David  Martinique   On: 03/16/2015 09:24   Ct Angio Chest Pe  W/cm &/or Wo Cm  03/15/2015   CLINICAL DATA:  Shortness of breath, history of lung cancer, history of chemotherapy and radiation therapy  EXAM: CT ANGIOGRAPHY CHEST WITH CONTRAST  TECHNIQUE: Multidetector CT imaging of the chest was performed using the standard protocol during bolus administration of intravenous contrast. Multiplanar CT image reconstructions and MIPs were obtained to evaluate the vascular anatomy.  CONTRAST:  155mL OMNIPAQUE IOHEXOL 350 MG/ML SOLN  COMPARISON:  12/23/2014, 09/22/2014, 06/05/2014  FINDINGS: There is adequate opacification of the pulmonary arteries. There is no pulmonary embolus. The main pulmonary artery, right main pulmonary artery and left main pulmonary arteries are normal in size. The heart size is normal. There is no pericardial effusion.  Stable subcarinal soft tissue prominence measuring 16 mm in short axis with ill defined margins extending along the right mainstem bronchus, bronchus intermedius and around the left mainstem bronchus unchanged in the prior exam. There is a stable 10 mm left hilar lymph node.  There is centrilobular emphysema. There is scarring and volume loss in the right middle lobe. There is a new spiculated 16 x 13 mm airspace opacity in the right upper lobe and in adjacent smaller spiculated 7 mm nodular opacity. There is a new subpleural 10 x 14 mm spiculated nodular opacity in the right lower lobe. There is no pleural effusion or pneumothorax.  There is no lytic or blastic osseous lesion.  The visualized portions of the upper abdomen are unremarkable.  Review of the MIP images confirms the above findings.  IMPRESSION: 1. New spiculated airspace opacity in the right upper lobe and right lower lobe most concerning for multilobar pneumonia. Recurrent malignancy is considered much less likely. Attention on follow-up examinations is recommended. 2. No evidence of pulmonary embolus. 3. Stable subcarinal soft tissue prominence.   Electronically Signed   By:  Kathreen Devoid   On: 03/15/2015 12:57   Dg Chest Port 1 View  03/15/2015   CLINICAL DATA:  Chest pain, shortness of breath  EXAM: PORTABLE CHEST - 1 VIEW  COMPARISON:  CT chest 12/23/2014  FINDINGS: The heart size and mediastinal contours are within normal limits. Both lungs are clear. The visualized skeletal structures are unremarkable.  IMPRESSION: No active disease.   Electronically Signed   By: Kathreen Devoid  On: 03/15/2015 11:36    2D ECHO:   Disposition and Follow-up:     Discharge Instructions    Diet - low sodium heart healthy    Complete by:  As directed      Increase activity slowly    Complete by:  As directed             DISPOSITION: Home   DISCHARGE FOLLOW-UP Follow-up Information    Follow up with [facility] Bunkie K., MD. Schedule an appointment as soon as possible for a visit in 2 weeks.   Specialty:  Oncology   Why:  for hospital follow-up   Contact information:   [place] [address] [phone]       Follow up with [facility].   Why:  office will call you with date and time of appointment   Contact information:   [place] [phone] [phone]       Time spent on Discharge: 35 minutes  Signed:   Libby Goehring M.D. [facility] 03/18/2015, 12:06 PM Pager: [phone]

## 2015-03-18 NOTE — Progress Notes (Signed)
CARE MANAGEMENT NOTE 03/18/2015  Patient:  Brittany [name], Brittany [name]   Account Number:  [id number]  Date Initiated:  03/18/2015  Documentation initiated by:  Ivor Costa Assessment:   54 yo female admitted with pna from home     Action/Plan:   discharge planning   Anticipated DC Date:  03/18/2015   Anticipated DC Plan:  [place]  CM consult  PCP issues      Choice offered to / List presented to:             Status of service:  Completed, signed off Medicare Important Message given?   (If response is "NO", the following Medicare IM given date fields will be blank) Date Medicare IM given:   Medicare IM given by:   Date Additional Medicare IM given:   Additional Medicare IM given by:    Discharge Disposition:  HOME/SELF CARE  Per UR Regulation:    If discussed at Long Length of Stay Meetings, dates discussed:    Comments:  03/18/15 Edwyna Shell RN BSN CM [phone] Received referral for PCP. Patient has [place] and she stated that she follows with Dr. [name] at the [facility] but has not had the need for a PCP. Discussed the importance of a PCP. Explained that she can log in to the [facility] website to access a provider list or she can call the cust service number on the back of her bcbs card. Patient verbalized understanding and had no other questions or concerns.

## 2015-03-21 LAB — CULTURE, BLOOD (ROUTINE X 2)
CULTURE: NO GROWTH
Culture: NO GROWTH
Culture: NO GROWTH
Culture: NO GROWTH

## 2015-03-25 ENCOUNTER — Telehealth: Payer: Self-pay | Admitting: Internal Medicine

## 2015-03-25 NOTE — Telephone Encounter (Signed)
returned call and sched appt with pt....pt ok and aware of d.t

## 2015-03-29 ENCOUNTER — Ambulatory Visit (INDEPENDENT_AMBULATORY_CARE_PROVIDER_SITE_OTHER): Payer: BLUE CROSS/BLUE SHIELD | Admitting: Cardiology

## 2015-03-29 ENCOUNTER — Encounter: Payer: Self-pay | Admitting: Cardiology

## 2015-03-29 VITALS — BP 128/82 | HR 84 | Ht 59.0 in | Wt 109.0 lb

## 2015-03-29 DIAGNOSIS — C3490 Malignant neoplasm of unspecified part of unspecified bronchus or lung: Secondary | ICD-10-CM

## 2015-03-29 DIAGNOSIS — I480 Paroxysmal atrial fibrillation: Secondary | ICD-10-CM

## 2015-03-29 DIAGNOSIS — Z7901 Long term (current) use of anticoagulants: Secondary | ICD-10-CM | POA: Diagnosis not present

## 2015-03-29 NOTE — Progress Notes (Signed)
Cardiology Office Note   Date:  03/29/2015   ID:  Brittany [name], DOB 02/24/1961, MRN [id number]  PCP:  Pcp Not In System  Cardiologist:   Candee Furbish, MD   Chief Complaint  Patient presents with  . Atrial Fibrillation      History of Present Illness: Brittany [name] is a 54 y.o. female who presents for evaluation of atrial fibrillation. 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 Has small cell lung cancer who was admitted on 03/15/15 to the hospital with implants of cough and shortness of breath, IV antibiotics were administered, blood culture no growth. She completed systemic chemotherapy with 6 cycles and radiation therapy last year.  She is feeling better. Minimal cough. Occasionally will feel palpitations, heart racing.  EKG on 03/11/14 noted atrial fibrillation with rapid ventricular response. Other EKG showed PACs, one EKG showed multifocal atrial tachycardia.  Anticoagulation was recommended last year but not started secondary to newly diagnosed mass and she was lost to follow-up. CHADS-VASc 2. During that hospitalization Xarelto was begun. There was continuation of Cardizem 120 mg a day as well as metoprolol 50 mg a day.  Last echocardiogram was on 03/12/14 which showed a possible mass protruding up on the pulmonary artery. Normal ejection fraction.  Past Medical History  Diagnosis Date  . Hypertension   . Allergy   . Asthma   . Anxiety   . Prophylactic measure 08/03/14-08/19/14    Prophyl. cranial radiation 24 Gy  . Small cell lung cancer 03/16/2014    Past Surgical History  Procedure Laterality Date  . Tubal ligation    . Cesarean section    . Video bronchoscopy with endobronchial ultrasound N/A 03/11/2014    Procedure: VIDEO BRONCHOSCOPY WITH ENDOBRONCHIAL ULTRASOUND;  Surgeon: Melrose Nakayama, MD;  Location: [facility];  Service: Thoracic;  Laterality: N/A;  . Mediastinoscopy N/A 03/11/2014    Procedure:  MEDIASTINOSCOPY;  Surgeon: Melrose Nakayama, MD;  Location: [place];  Service: Thoracic;  Laterality: N/A;     Current Outpatient Prescriptions  Medication Sig Dispense Refill  . albuterol (PROVENTIL HFA) 108 (90 BASE) MCG/ACT inhaler Inhale 2 puffs into the lungs every 4 (four) hours as needed for wheezing or shortness of breath. 6.7 each 5  . albuterol (PROVENTIL) (2.5 MG/3ML) 0.083% nebulizer solution Take 3 mLs (2.5 mg total) by nebulization every 6 (six) hours as needed for wheezing. (Patient not taking: Reported on 03/15/2015) 150 mL 1  . albuterol (PROVENTIL) (2.5 MG/3ML) 0.083% nebulizer solution Take 2.5 mg by nebulization every 6 (six) hours as needed for wheezing or shortness of breath (wheezing and shortness of breath).    Marland Kitchen atorvastatin (LIPITOR) 20 MG tablet Take 1 tablet (20 mg total) by mouth daily. 90 tablet 0  . azithromycin (ZITHROMAX) 500 MG tablet Take 1 tablet (500 mg total) by mouth daily. X 4 more days 4 tablet 0  . benzonatate (TESSALON) 100 MG capsule Take 1 capsule (100 mg total) by mouth 3 (three) times daily as needed for cough. 60 capsule 0  . cefUROXime (CEFTIN) 500 MG tablet Take 1 tablet (500 mg total) by mouth 2 (two) times daily with a meal. X 4 more days 9 tablet 0  . diltiazem (CARDIZEM CD) 120 MG 24 hr capsule Take 1 capsule (120 mg total) by mouth daily. 30 capsule 0  . feeding supplement, RESOURCE BREEZE, (RESOURCE BREEZE) LIQD Take 1 Container by mouth 2 (two) times daily between meals. [place]  0  . guaiFENesin (MUCINEX) 600 MG 12 hr tablet Take 600 mg by mouth 2 (two) times daily as needed for cough (cough).    Marland Kitchen lisinopril (PRINIVIL,ZESTRIL) 10 MG tablet Take 10 mg by mouth daily.    . metoprolol succinate (TOPROL-XL) 50 MG 24 hr tablet Take 1 tablet (50 mg total) by mouth daily. Take with or immediately following a meal. 30 tablet 3  . prochlorperazine (COMPAZINE) 10 MG tablet Take 1 tablet (10 mg total) by mouth every 6 (six) hours as needed for  nausea or vomiting. 60 tablet 0  . rivaroxaban (XARELTO) 20 MG TABS tablet Take 1 tablet (20 mg total) by mouth daily with supper. 30 tablet 4  . triamcinolone (NASACORT) 55 MCG/ACT AERO nasal inhaler Place 2 sprays into both nostrils daily.     No current facility-administered medications for this visit.    Allergies:   Codeine; Iodinated diagnostic agents; and Sulfa antibiotics    Social History:  The patient  reports that she quit smoking about 12 months ago. Her smoking use included Cigarettes. She has a 20 pack-year smoking history. She has never used smokeless tobacco. She reports that she drinks about 4.8 oz of alcohol per week. She reports that she does not use illicit drugs.   Family History:  The patient's family history includes Heart disease in her mother; Hypertension in her mother.    ROS:  Please see the history of present illness.   Otherwise, review of systems are positive for none.   All other systems are reviewed and negative.    PHYSICAL EXAM: VS:  Ht 4\' 11"  (1.499 m)  Wt 109 lb (49.442 kg)  BMI 22.00 kg/m2 , BMI Body mass index is 22 kg/(m^2). GEN: Well nourished, well developed, in no acute distress HEENT: normal Neck: no JVD, carotid bruits, or masses Cardiac: RRR; no murmurs, rubs, or gallops,no edema  Respiratory:  clear to auscultation bilaterally, normal work of breathing GI: soft, nontender, nondistended, + BS MS: no deformity or atrophy Skin: warm and dry, no rash Neuro:  Strength and sensation are intact Psych: euthymic mood, full affect   EKG:  EKG is ordered today. The ekg ordered today demonstrates 03/29/15-sinus rhythm, 84, biatrial enlargement, vertical axis. No evidence of atrial fibrillation. Prior EKG from 03/12/15 demonstrated atrial fibrillation with rapid ventricular response.   Recent Labs: 09/22/2014: Magnesium 2.4 03/15/2015: ALT 15; B Natriuretic Peptide 58.8 03/17/2015: BUN 8; Creatinine 0.52; Hemoglobin 14.1; Platelets 182; Potassium  3.8; Sodium 140    Lipid Panel    Component Value Date/Time   CHOL 279* 01/01/2014 1111   TRIG 109 01/01/2014 1111   HDL 49 01/01/2014 1111   CHOLHDL 5.7 01/01/2014 1111   VLDL 22 01/01/2014 1111   LDLCALC 208* 01/01/2014 1111      Wt Readings from Last 3 Encounters:  03/29/15 109 lb (49.442 kg)  03/15/15 108 lb 1.6 oz (49.034 kg)  12/31/14 115 lb 9.6 oz (52.436 kg)      Other studies Reviewed: Additional studies/ records that were reviewed today include: Hospital records reviewed, lab work reviewed, CT scan reviewed. Review of the above records demonstrates: As above   ASSESSMENT AND PLAN:  1.  Atrial fibrillation-EKG from 03/11/14 at 1707 demonstrates atrial fibrillation with rapid ventricular response heart rate 160. Other EKGs demonstrate sinus rhythm with PACs as well as multifocal atrial tachycardia. We discussed the risks and benefits of anticoagulation. Given her CHADS-VASc of 2, female, hypertension, anticoagulation recommended. Agree. We discussed alternatives. We  discussed ablative therapy and when to consider this. At this time, continue with metoprolol and diltiazem. She is doing well with this combination. If atrial fibrillation returns, consider taking an extra diltiazem or an extra metoprolol and seeking medical attention if heart rate does not become better controlled. Hemoglobin and creatinine are stable. Continue with Xarelto.  2. Chronic anticoagulation-Xarelto. As above CBC, bmet every 6 months  3. Non-small cell lung cancer-per oncology.   Current medicines are reviewed at length with the patient today.  The patient does not have concerns regarding medicines.  The following changes have been made:  no change  Labs/ tests ordered today include: none  No orders of the defined types were placed in this encounter.     Disposition:   FU with Bilaal Leib in 6 months   Signed, Candee Furbish, MD  03/29/2015 1:40 PM    [facility] [place], [place],   [address] Phone: [phone]; Fax: [phone]

## 2015-03-29 NOTE — Patient Instructions (Signed)
The current medical regimen is effective;  continue present plan and medications.  Follow up in 6 months with Dr. Skains.  You will receive a letter in the mail 2 months before you are due.  Please call us when you receive this letter to schedule your follow up appointment.  Thank you for choosing [facility]!!     

## 2015-04-01 ENCOUNTER — Ambulatory Visit (HOSPITAL_COMMUNITY): Payer: BLUE CROSS/BLUE SHIELD

## 2015-04-21 ENCOUNTER — Ambulatory Visit (HOSPITAL_COMMUNITY)
Admission: RE | Admit: 2015-04-21 | Discharge: 2015-04-21 | Disposition: A | Discharge status: Home / Self Care | Payer: BLUE CROSS/BLUE SHIELD | Source: Ambulatory Visit | Attending: Internal Medicine | Admitting: Internal Medicine

## 2015-04-21 ENCOUNTER — Encounter (HOSPITAL_COMMUNITY): Payer: Self-pay

## 2015-04-21 DIAGNOSIS — Z923 Personal history of irradiation: Secondary | ICD-10-CM | POA: Insufficient documentation

## 2015-04-21 DIAGNOSIS — C3491 Malignant neoplasm of unspecified part of right bronchus or lung: Secondary | ICD-10-CM | POA: Insufficient documentation

## 2015-04-21 DIAGNOSIS — Z9221 Personal history of antineoplastic chemotherapy: Secondary | ICD-10-CM | POA: Insufficient documentation

## 2015-04-21 MED ORDER — IOHEXOL 300 MG/ML  SOLN
100.0000 mL | Freq: Once | INTRAMUSCULAR | Status: AC | PRN
  Administered 2015-04-21: 80 mL via INTRAVENOUS

## 2015-04-23 ENCOUNTER — Telehealth: Payer: Self-pay | Admitting: Internal Medicine

## 2015-04-23 NOTE — Telephone Encounter (Signed)
returned call and lvm for pt that no labs are needed per MD staff

## 2015-04-26 ENCOUNTER — Telehealth: Payer: Self-pay | Admitting: Internal Medicine

## 2015-04-26 ENCOUNTER — Encounter: Payer: Self-pay | Admitting: Internal Medicine

## 2015-04-26 ENCOUNTER — Ambulatory Visit (HOSPITAL_BASED_OUTPATIENT_CLINIC_OR_DEPARTMENT_OTHER): Payer: BLUE CROSS/BLUE SHIELD | Admitting: Internal Medicine

## 2015-04-26 VITALS — BP 141/83 | HR 102 | Temp 98.0°F | Resp 18 | Ht 59.0 in | Wt 110.3 lb

## 2015-04-26 DIAGNOSIS — C7A1 Malignant poorly differentiated neuroendocrine tumors: Secondary | ICD-10-CM

## 2015-04-26 DIAGNOSIS — C3490 Malignant neoplasm of unspecified part of unspecified bronchus or lung: Secondary | ICD-10-CM

## 2015-04-26 NOTE — Telephone Encounter (Signed)
Gave and printed appt sched and avs for pt for Aug °

## 2015-04-26 NOTE — Progress Notes (Signed)
[place] [phone]   [phone]  OFFICE PROGRESS NOTE  Pcp Not In System No address on file  DIAGNOSIS: Limited stage small cell lung cancer diagnosed in March of 2015.  PRIOR THERAPY:  1) Single fraction of radiotherapy to the mediastinum under the care of Dr. Pablo Ledger on 03/13/2014. 2) Systemic chemotherapy with cisplatin 60 mg/M2 day 1 and etoposide 120 mg/M2 on days 1, 2 and 3 with Neulasta support on day 4. First dose on 03/18/2014. Status post 6 cycles. Last dose was given 06/29/2014. 3) prophylactic cranial irradiation under the care of Dr. Pablo Ledger  CURRENT THERAPY: Observation.  INTERVAL HISTORY: Brittany [name] 54 y.o. female returns to the clinic today for three-month followup visit. She has been observation since July 2015. She is feeling fine today with no specific complaints. She was recently admitted to [facility] for treatment of pneumonia. She denied having any significant fever or chills, no nausea or vomiting. The patient denied having any significant chest pain, shortness of breath, cough or hemoptysis. She has no weight loss or night sweats. She had repeat CT scan of the chest performed recently and she is here for evaluation and discussion of her scan results.  MEDICAL HISTORY: Past Medical History  Diagnosis Date  . Hypertension   . Allergy   . Asthma   . Anxiety   . Prophylactic measure 08/03/14-08/19/14    Prophyl. cranial radiation 24 Gy  . Small cell lung cancer 03/16/2014    ALLERGIES:  is allergic to codeine; iodinated diagnostic agents; and sulfa antibiotics.  MEDICATIONS:  Current Outpatient Prescriptions  Medication Sig Dispense Refill  . albuterol (PROVENTIL HFA) 108 (90 BASE) MCG/ACT inhaler Inhale 2 puffs into the lungs every 4 (four) hours as needed for wheezing or shortness of breath. 6.7 each 5  . albuterol (PROVENTIL) (2.5 MG/3ML) 0.083% nebulizer solution Take 2.5 mg by  nebulization every 6 (six) hours as needed for wheezing or shortness of breath (wheezing and shortness of breath).    Marland Kitchen atorvastatin (LIPITOR) 20 MG tablet Take 1 tablet (20 mg total) by mouth daily. 90 tablet 0  . benzonatate (TESSALON) 100 MG capsule Take 1 capsule (100 mg total) by mouth 3 (three) times daily as needed for cough. 60 capsule 0  . diltiazem (CARDIZEM CD) 120 MG 24 hr capsule Take 1 capsule (120 mg total) by mouth daily. 30 capsule 0  . feeding supplement, RESOURCE BREEZE, (RESOURCE BREEZE) LIQD Take 1 Container by mouth 2 (two) times daily between meals. 30 Container 0  . lisinopril (PRINIVIL,ZESTRIL) 10 MG tablet Take 10 mg by mouth daily.    . metoprolol succinate (TOPROL-XL) 50 MG 24 hr tablet Take 1 tablet (50 mg total) by mouth daily. Take with or immediately following a meal. 30 tablet 3  . prochlorperazine (COMPAZINE) 10 MG tablet Take 1 tablet (10 mg total) by mouth every 6 (six) hours as needed for nausea or vomiting. 60 tablet 0  . rivaroxaban (XARELTO) 20 MG TABS tablet Take 1 tablet (20 mg total) by mouth daily with supper. 30 tablet 4  . triamcinolone (NASACORT) 55 MCG/ACT AERO nasal inhaler Place 2 sprays into both nostrils daily.     No current facility-administered medications for this visit.    SURGICAL HISTORY:  Past Surgical History  Procedure Laterality Date  . Tubal ligation    . Cesarean section    . Video bronchoscopy with endobronchial ultrasound N/A 03/11/2014    Procedure: VIDEO BRONCHOSCOPY  WITH ENDOBRONCHIAL ULTRASOUND;  Surgeon: Melrose Nakayama, MD;  Location: [place];  Service: Thoracic;  Laterality: N/A;  . Mediastinoscopy N/A 03/11/2014    Procedure: MEDIASTINOSCOPY;  Surgeon: Melrose Nakayama, MD;  Location: [place];  Service: Thoracic;  Laterality: N/A;    REVIEW OF SYSTEMS:  Constitutional: positive for fatigue Eyes: negative Ears, nose, mouth, throat, and face: negative Respiratory: negative Cardiovascular:  negative Gastrointestinal: negative Genitourinary:negative Integument/breast: negative Hematologic/lymphatic: negative Musculoskeletal:negative Neurological: negative Behavioral/Psych: negative Endocrine: negative Allergic/Immunologic: negative   PHYSICAL EXAMINATION: General appearance: alert, cooperative and no distress Head: Normocephalic, without obvious abnormality, atraumatic Neck: no adenopathy, no JVD, supple, symmetrical, trachea midline and thyroid not enlarged, symmetric, no tenderness/mass/nodules Lymph nodes: Cervical, supraclavicular, and axillary nodes normal. Resp: wheezes bilaterally Back: symmetric, no curvature. ROM normal. No CVA tenderness. Cardio: regular rate and rhythm, S1, S2 normal, no murmur, click, rub or gallop GI: soft, non-tender; bowel sounds normal; no masses,  no organomegaly Extremities: extremities normal, atraumatic, no cyanosis or edema Neurologic: Alert and oriented X 3, normal strength and tone. Normal symmetric reflexes. Normal coordination and gait  ECOG PERFORMANCE STATUS: 1 - Symptomatic but completely ambulatory  Blood pressure 141/83, pulse 102, temperature 98 F (36.7 C), temperature source Oral, resp. rate 18, height '4\' 11"'$  (1.499 m), weight 110 lb 4.8 oz (50.032 kg), SpO2 100 %.  LABORATORY DATA: Lab Results  Component Value Date   WBC 7.4 03/17/2015   HGB 14.1 03/17/2015   HCT 44.4 03/17/2015   MCV 102.8* 03/17/2015   PLT 182 03/17/2015      Chemistry      Component Value Date/Time   NA 140 03/17/2015 0526   NA 142 12/23/2014 0829   K 3.8 03/17/2015 0526   K 3.9 12/23/2014 0829   CL 109 03/17/2015 0526   CO2 24 03/17/2015 0526   CO2 26 12/23/2014 0829   BUN 8 03/17/2015 0526   BUN 7.2 12/23/2014 0829   CREATININE 0.52 03/17/2015 0526   CREATININE 0.7 12/23/2014 0829   CREATININE 0.56 01/01/2014 1111      Component Value Date/Time   CALCIUM 9.0 03/17/2015 0526   CALCIUM 9.3 12/23/2014 0829   ALKPHOS 93 03/15/2015  1100   ALKPHOS 98 12/23/2014 0829   AST 18 03/15/2015 1100   AST 20 12/23/2014 0829   ALT 15 03/15/2015 1100   ALT 20 12/23/2014 0829   BILITOT 1.2 03/15/2015 1100   BILITOT 0.69 12/23/2014 0829       RADIOGRAPHIC STUDIES: Ct Chest W Contrast  04/21/2015   CLINICAL DATA:  Small cell lung cancer diagnosed 2015. Chemotherapy radiation therapy complete. Patient with contrast allergy receive 13 hour prep. No adverse effects. Radiation to mediastinum 03/13/2014.  EXAM: CT CHEST WITH CONTRAST  TECHNIQUE: Multidetector CT imaging of the chest was performed during intravenous contrast administration.  CONTRAST:  101m OMNIPAQUE IOHEXOL 300 MG/ML  SOLN  COMPARISON:  CT chest 03/15/1999 16, PET-CT 03/25/2014  FINDINGS: Mediastinum/Nodes: Indistinct tissue planes around the carina and bronchus intermedius are not changed from prior. No measurable adenopathy in the mediastinum. No supraclavicular adenopathy or axillary adenopathy. No pericardial fluid. Esophagus is normal.  Lungs/Pleura: Within the right upper lobe there is a band of reticulation and ground-glass opacity measuring 6 cm on image 27 series 5. Within the right lower lobe there are foci of ground-glass opacity with some central consolidation measuring 3.3 cm and 3.1 cm on image 35, series 5. These findings have progressed from CT of 03/15/2015 and are new from  12/23/2014. Minimal nodularity to these and ground-glass and reticular patterns. These ground-glass opacities are slightly peripheral to the right hilum as seen on image 40 of the coronal series. The more central perihilar lung appears clear. Somewhat limit lower central nodularity seen on most recent CT scan as cleared.  Upper abdomen: Limited view of the liver, kidneys, pancreas are unremarkable. Normal adrenal glands.  Musculoskeletal: No aggressive osseous lesion.  IMPRESSION: 1. Persistent and increased ground-glass opacities with mild nodularity surrounding the right hilum but slightly  peripheral to the hilum. This is not a typical infectious pattern. Differential consideration would include delayed radiation change (last radiation therapy 03/13/2014) versus carcinoma recurrence peripheral to the radiation field. 2. Ill-defined tissue planes within the mediastinum are not changed from prior and consistent with radiation change.   Electronically Signed   By: Suzy Bouchard M.D.   On: 04/21/2015 10:17   ASSESSMENT AND PLAN: This is a very pleasant 54 years old [place] American female recently diagnosed with limited stage small cell lung cancer.  She completed systemic chemotherapy with cisplatin and etoposide status post 6 cycles and tolerating her treatment fairly well. This was followed by prophylactic cranial irradiation. The recent CT scan of the chest showed no evidence for disease recurrence. I discussed the scan results with the patient. I recommended for her to continue on observation with repeat CT scan of the chest in 3 months. She was advised to call immediately if she has any concerning symptoms in the interval. The patient voices understanding of current disease status and treatment options and is in agreement with the current care plan.  All questions were answered. The patient knows to call the clinic with any problems, questions or concerns. We can certainly see the patient much sooner if necessary.  Disclaimer: This note was dictated with voice recognition software. Similar sounding words can inadvertently be transcribed and may not be corrected upon review.

## 2015-07-26 ENCOUNTER — Telehealth: Payer: Self-pay | Admitting: Medical Oncology

## 2015-07-26 ENCOUNTER — Other Ambulatory Visit: Payer: BLUE CROSS/BLUE SHIELD

## 2015-07-26 NOTE — Telephone Encounter (Signed)
I left message to return call about missed lab appt and if she has CT scheduled.

## 2015-08-02 ENCOUNTER — Ambulatory Visit: Payer: BLUE CROSS/BLUE SHIELD | Admitting: Internal Medicine

## 2015-10-14 ENCOUNTER — Ambulatory Visit (INDEPENDENT_AMBULATORY_CARE_PROVIDER_SITE_OTHER): Payer: BLUE CROSS/BLUE SHIELD | Admitting: Family Medicine

## 2015-10-14 ENCOUNTER — Ambulatory Visit (INDEPENDENT_AMBULATORY_CARE_PROVIDER_SITE_OTHER): Payer: BLUE CROSS/BLUE SHIELD

## 2015-10-14 VITALS — BP 143/93 | HR 96 | Temp 98.1°F | Ht 59.5 in | Wt 107.4 lb

## 2015-10-14 DIAGNOSIS — S40011A Contusion of right shoulder, initial encounter: Secondary | ICD-10-CM

## 2015-10-14 MED ORDER — CYCLOBENZAPRINE HCL 10 MG PO TABS: 10.0000 mg | ORAL_TABLET | Freq: Two times a day (BID) | ORAL | Status: DC | PRN

## 2015-10-14 NOTE — Progress Notes (Signed)
[facility] [address] [place] [address] [phone]  Date:  10/14/2015   Name:  Brittany [name]   DOB:  01/23/1961   MRN:  [id number]  PCP:  Pcp Not In System    Chief Complaint: Back Pain; Shoulder Pain; and Arm Pain   History of Present Illness:  Brittany [name] is a 54 y.o. very pleasant female patient who presents with the following:  Here today with complaint of pain in her right arm and shoulder- 2 days ago she was exiting an automatic door at a store and the doors started to close on her- it hit her on the right shoulder. Only her right side got hurt Her right shoulder feels stiff and sore.   She has tried heat.  However it seems to be getting worse.   She has been using ibuprofen as needed   She has a known histoyr of SVT over the last year or so- she has noted that her pulse is up a bit recently but has not had any palpiations or CP Patient Active Problem List   Diagnosis Date Noted  . CAP (community acquired pneumonia) 03/15/2015  . Chest pain 03/15/2015  . Pneumonia 03/15/2015  . Lung cancer ([place])   . Small cell lung cancer ([place]) 03/16/2014  . Protein-calorie malnutrition, severe ([place]) 03/14/2014  . Atrial fibrillation ([place]) 03/12/2014  . SVT (supraventricular tachycardia) ([place]) 03/11/2014  . Lung mass 03/10/2014  . HTN (hypertension) 05/07/2012  . Asthma 05/07/2012    Past Medical History  Diagnosis Date  . Hypertension   . Allergy   . Asthma   . Anxiety   . Prophylactic measure 08/03/14-08/19/14    Prophyl. cranial radiation 24 Gy  . Small cell lung cancer ([place]) 03/16/2014    Past Surgical History  Procedure Laterality Date  . Tubal ligation    . Cesarean section    . Video bronchoscopy with endobronchial ultrasound N/A 03/11/2014    Procedure: VIDEO BRONCHOSCOPY WITH ENDOBRONCHIAL ULTRASOUND;  Surgeon: Melrose Nakayama, MD;  Location: [place];  Service: Thoracic;  Laterality: N/A;  . Mediastinoscopy N/A  03/11/2014    Procedure: MEDIASTINOSCOPY;  Surgeon: Melrose Nakayama, MD;  Location: [place];  Service: Thoracic;  Laterality: N/A;    Social History  Substance Use Topics  . Smoking status: Former Smoker -- 1.00 packs/day for 20 years    Types: Cigarettes    Quit date: 03/13/2014  . Smokeless tobacco: Never Used  . Alcohol Use: 4.8 oz/week    8 Glasses of wine per week    Family History  Problem Relation Age of Onset  . Heart disease Mother   . Hypertension Mother   . Heart attack Mother   . Stroke Neg Hx   . Hypertension Maternal Grandmother   . Diabetes Paternal Grandmother   . Cancer Maternal Grandmother     Allergies  Allergen Reactions  . Codeine Nausea And Vomiting  . Iodinated Diagnostic Agents Hives and Rash  . Sulfa Antibiotics Hives and Rash    Medication list has been reviewed and updated.  Current Outpatient Prescriptions on File Prior to Visit  Medication Sig Dispense Refill  . albuterol (PROVENTIL HFA) 108 (90 BASE) MCG/ACT inhaler Inhale 2 puffs into the lungs every 4 (four) hours as needed for wheezing or shortness of breath. 6.7 each 5  . albuterol (PROVENTIL) (2.5 MG/3ML) 0.083% nebulizer solution Take 2.5 mg by nebulization every 6 (six) hours as needed for wheezing or shortness of  breath (wheezing and shortness of breath).    Marland Kitchen atorvastatin (LIPITOR) 20 MG tablet Take 1 tablet (20 mg total) by mouth daily. 90 tablet 0  . benzonatate (TESSALON) 100 MG capsule Take 1 capsule (100 mg total) by mouth 3 (three) times daily as needed for cough. 60 capsule 0  . diltiazem (CARDIZEM CD) 120 MG 24 hr capsule Take 1 capsule (120 mg total) by mouth daily. 30 capsule 0  . lisinopril (PRINIVIL,ZESTRIL) 10 MG tablet Take 10 mg by mouth daily.    . metoprolol succinate (TOPROL-XL) 50 MG 24 hr tablet Take 1 tablet (50 mg total) by mouth daily. Take with or immediately following a meal. 30 tablet 3  . rivaroxaban (XARELTO) 20 MG TABS tablet Take 1 tablet (20 mg total) by  mouth daily with supper. 30 tablet 4   No current facility-administered medications on file prior to visit.    Review of Systems:  As per HPI- otherwise negative.   Physical Examination: Filed Vitals:   10/14/15 1913  BP: 143/93  Pulse: 112  Temp: 98.1 F (36.7 C)   Filed Vitals:   10/14/15 1913  Height: 4' 11.5" (1.511 m)  Weight: 107 lb 6 oz (48.705 kg)   Body mass index is 21.33 kg/(m^2). Ideal Body Weight: Weight in (lb) to have BMI = 25: 125.6  GEN: WDWN, NAD, Non-toxic, A & O x 3. Petite build, looks well HEENT: Atraumatic, Normocephalic. Neck supple. No masses, No LAD.  Bilateral TM wnl, oropharynx normal.  PEERL,EOMI.   Ears and Nose: No external deformity. CV: RRR, No M/G/R. No JVD. No thrill. No extra heart sounds. PULM: CTA B, no wheezes, crackles, rhonchi. No retractions. No resp. distress. No accessory muscle use. ABD: S, NT, ND EXTR: No c/c/e NEURO Normal gait.  PSYCH: Normally interactive. Conversant. Not depressed or anxious appearing.  Calm demeanor.  She has tenderness in the right trapezius muscle, and tenderness will full right shoulder flexion and abduction.  Tenderness over the lateral deltoid.  Normal strength, sensation and DTR of BUE.  Cervical spine is non- tender with normal ROM   [facility] reading (PRIMARY) by  Dr. Lorelei Pont. Right shoulder: negative Right humerus: negative  (pt has known history of lung cancer) RIGHT SHOULDER - 2+ VIEW  COMPARISON: CT of 04/21/2015 chest.  FINDINGS: No acute fracture or dislocation. Visualized right hemithorax grossly unremarkable.  IMPRESSION: No acute osseous abnormality.  RIGHT HUMERUS - 2+ VIEW  COMPARISON: Chest 03/16/2015  FINDINGS: There is no evidence of fracture or other focal bone lesions. Soft tissues are unremarkable. Incidental note of right perihilar changes likely corresponding to known lung cancer and/or radiation changes.  IMPRESSION: No acute bony abnormalities.  Pulse Readings  from Last 3 Encounters:  10/14/15 112  04/26/15 102  03/29/15 84   On recheck her pulse rate is normal; normal rhythm to long listen  Assessment and Plan: Contusion of right shoulder, initial encounter - Plan: DG Shoulder Right, DG Humerus Right, cyclobenzaprine (FLEXERIL) 10 MG tablet  Treat for a contusion of her shoulder with flexeril She will let me know if not feeling better soon  Signed Lamar Blinks, MD

## 2015-10-14 NOTE — Patient Instructions (Signed)
Your x-ray look fine to me but I will let you know if the radiologist has any other concerns Try the flexeril as needed for pain and spasm- however remember it will make you sleepy- don't take it when you need to drive.  Heat and gentle stretches, tylenol will also help  Let us know if you are not improving over the next couple of days.

## 2016-01-03 ENCOUNTER — Telehealth: Payer: Self-pay

## 2016-01-03 NOTE — Telephone Encounter (Signed)
Waiting on payment of $10.00 for 14 pages. From [facility]

## 2016-01-07 DIAGNOSIS — Z0271 Encounter for disability determination: Secondary | ICD-10-CM

## 2016-01-07 NOTE — Telephone Encounter (Signed)
Payment received and records sent on 01/07/16

## 2017-10-14 ENCOUNTER — Emergency Department (HOSPITAL_COMMUNITY)
Admission: EM | Admit: 2017-10-14 | Discharge: 2017-10-14 | Disposition: A | Discharge status: Home / Self Care | Payer: BLUE CROSS/BLUE SHIELD | Attending: Emergency Medicine | Admitting: Emergency Medicine

## 2017-10-14 ENCOUNTER — Emergency Department (HOSPITAL_COMMUNITY): Payer: BLUE CROSS/BLUE SHIELD

## 2017-10-14 ENCOUNTER — Encounter (HOSPITAL_COMMUNITY): Payer: Self-pay | Admitting: *Deleted

## 2017-10-14 ENCOUNTER — Other Ambulatory Visit: Payer: Self-pay

## 2017-10-14 DIAGNOSIS — I10 Essential (primary) hypertension: Secondary | ICD-10-CM | POA: Insufficient documentation

## 2017-10-14 DIAGNOSIS — Z91040 Latex allergy status: Secondary | ICD-10-CM | POA: Diagnosis not present

## 2017-10-14 DIAGNOSIS — Z87891 Personal history of nicotine dependence: Secondary | ICD-10-CM | POA: Diagnosis not present

## 2017-10-14 DIAGNOSIS — R002 Palpitations: Secondary | ICD-10-CM | POA: Diagnosis present

## 2017-10-14 DIAGNOSIS — Z85118 Personal history of other malignant neoplasm of bronchus and lung: Secondary | ICD-10-CM | POA: Diagnosis not present

## 2017-10-14 DIAGNOSIS — J45909 Unspecified asthma, uncomplicated: Secondary | ICD-10-CM | POA: Diagnosis not present

## 2017-10-14 LAB — BASIC METABOLIC PANEL
ANION GAP: 11 (ref 5–15)
BUN: 12 mg/dL (ref 6–20)
CALCIUM: 10 mg/dL (ref 8.9–10.3)
CO2: 26 mmol/L (ref 22–32)
Chloride: 100 mmol/L — ABNORMAL LOW (ref 101–111)
Creatinine, Ser: 0.7 mg/dL (ref 0.44–1.00)
GFR calc Af Amer: >60 mL/min (ref >60)
GLUCOSE: 116 mg/dL — AB (ref 65–99)
Potassium: 4.4 mmol/L (ref 3.5–5.1)
SODIUM: 137 mmol/L (ref 135–145)

## 2017-10-14 LAB — CBC
HEMATOCRIT: 50.4 % — AB (ref 36.0–46.0)
HEMOGLOBIN: 18.1 g/dL — AB (ref 12.0–15.0)
MCH: 36.4 pg — ABNORMAL HIGH (ref 26.0–34.0)
MCHC: 35.9 g/dL (ref 30.0–36.0)
MCV: 101.4 fL — ABNORMAL HIGH (ref 78.0–100.0)
Platelets: 190 10*3/uL (ref 150–400)
RBC: 4.97 MIL/uL (ref 3.87–5.11)
RDW: 21.4 % — AB (ref 11.5–15.5)
WBC: 6 10*3/uL (ref 4.0–10.5)

## 2017-10-14 LAB — I-STAT TROPONIN, ED: TROPONIN I, POC: 0 ng/mL (ref 0.00–0.08)

## 2017-10-14 MED ORDER — AMLODIPINE BESYLATE 5 MG PO TABS: 5.0000 mg | ORAL_TABLET | Freq: Every day | ORAL | 0 refills | Status: DC

## 2017-10-14 NOTE — ED Provider Notes (Signed)
[place] DEPT Provider Note   CSN: [id number] Arrival date & time: 10/14/17  0831     History   Chief Complaint Chief Complaint  Patient presents with  . palpations  . Hypertension    HPI Brittany [name] is a 56 y.o. female.  HPI Patient presents with palpitations and chest tightness. Began last night.  She states she has been checking her blood pressure at home it is been high at times and low at other times.  She also has a dull headache.  Her chest pain is dull in her anterior chest.  States she feels like this when her blood pressure goes up.  History of atrial fibrillation.  Previously was on blood pressure but she stopped them because she felt as if she was doing good and she has not been seeing doctors.  Recently moved back from [place].  Previous history of lung cancer but is reportedly been cancer free for the last 2 years.  No swelling in her legs.  States she has had to use her inhaler more.  Has a history of asthma.  States she also has had to change her decongestants. Past Medical History:  Diagnosis Date  . Allergy   . Anxiety   . Asthma   . Atrial fibrillation ([place])   . Hypertension   . Prophylactic measure 08/03/14-08/19/14   Prophyl. cranial radiation 24 Gy  . Small cell lung cancer ([place]) 03/16/2014    Patient Active Problem List   Diagnosis Date Noted  . CAP (community acquired pneumonia) 03/15/2015  . Chest pain 03/15/2015  . Pneumonia 03/15/2015  . Lung cancer ([place])   . Small cell lung cancer ([place]) 03/16/2014  . Protein-calorie malnutrition, severe ([place]) 03/14/2014  . Atrial fibrillation ([place]) 03/12/2014  . SVT (supraventricular tachycardia) ([place]) 03/11/2014  . Lung mass 03/10/2014  . HTN (hypertension) 05/07/2012  . Asthma 05/07/2012    Past Surgical History:  Procedure Laterality Date  . CESAREAN SECTION    . TUBAL LIGATION      OB History    No data available       Home Medications    Prior to  Admission medications   Medication Sig Start Date End Date Taking? Authorizing Provider  albuterol (PROVENTIL HFA) 108 (90 BASE) MCG/ACT inhaler Inhale 2 puffs into the lungs every 4 (four) hours as needed for wheezing or shortness of breath. 02/24/14  Yes Roselee Culver, MD  albuterol (PROVENTIL) (2.5 MG/3ML) 0.083% nebulizer solution Take 2.5 mg by nebulization every 6 (six) hours as needed for wheezing or shortness of breath (wheezing and shortness of breath).   Yes [provider]  cetirizine (ZYRTEC) 10 MG tablet Take 10 mg daily as needed by mouth for allergies.   Yes [provider]  fluticasone (FLONASE) 50 MCG/ACT nasal spray Place 2 sprays daily as needed into both nostrils for allergies.   Yes [provider]  naproxen sodium (ALEVE) 220 MG tablet Take 220 mg every 8 (eight) hours as needed by mouth (For headache.).   Yes [provider]  amLODipine (NORVASC) 5 MG tablet Take 1 tablet (5 mg total) daily by mouth. 10/14/17   Davonna Belling, MD    Family History Family History  Problem Relation Age of Onset  . Heart disease Mother   . Hypertension Mother   . Heart attack Mother   . Hypertension Maternal Grandmother   . Cancer Maternal Grandmother   . Diabetes Paternal Grandmother   . Stroke Neg Hx  Social History Social History   Tobacco Use  . Smoking status: Former Smoker    Packs/day: 1.00    Years: 20.00    Pack years: 20.00    Types: Cigarettes    Last attempt to quit: 03/13/2014    Years since quitting: 3.5  . Smokeless tobacco: Never Used  Substance Use Topics  . Alcohol use: Yes    Alcohol/week: 4.8 oz    Types: 8 Glasses of wine per week  . Drug use: No     Allergies   Codeine; Latex; Pineapple; Iodinated diagnostic agents; and Sulfa antibiotics   Review of Systems Review of Systems  Constitutional: Negative for appetite change.  HENT: Negative for congestion.   Respiratory: Positive for cough, shortness of  breath and wheezing.   Cardiovascular: Positive for chest pain and palpitations. Negative for leg swelling.  Gastrointestinal: Negative for abdominal pain.  Genitourinary: Negative for enuresis.  Musculoskeletal: Negative for back pain.  Neurological: Positive for headaches.  Hematological: Negative for adenopathy.  Psychiatric/Behavioral: Negative for confusion.     Physical Exam Updated Vital Signs BP (!) 158/93   Pulse (!) 101   Temp 98.6 F (37 C) (Oral)   Resp 16   Ht 5' (1.524 m)   Wt 53.1 kg (117 lb)   SpO2 97%   BMI 22.85 kg/m   Physical Exam  Constitutional: She appears well-developed.  HENT:  Head: Normocephalic.  Eyes: Pupils are equal, round, and reactive to light.  Neck: Neck supple.  Cardiovascular:  Mild tachycardia  Pulmonary/Chest: Effort normal.  Abdominal: Soft. There is no tenderness.  Musculoskeletal: She exhibits no edema.  Neurological: She is alert.  Skin: Capillary refill takes less than 2 seconds.  Psychiatric: She has a normal mood and affect.     ED Treatments / Results  Labs (all labs ordered are listed, but only abnormal results are displayed) Labs Reviewed  BASIC METABOLIC PANEL - Abnormal; Notable for the following components:      Result Value   Chloride 100 (*)    Glucose, Bld 116 (*)    All other components within normal limits  CBC - Abnormal; Notable for the following components:   Hemoglobin 18.1 (*)    HCT 50.4 (*)    MCV 101.4 (*)    MCH 36.4 (*)    RDW 21.4 (*)    All other components within normal limits  I-STAT TROPONIN, ED    EKG  EKG Interpretation  Date/Time:  Sunday October 14 2017 08:47:49 EST Ventricular Rate:  107 PR Interval:    QRS Duration: 86 QT Interval:  359 QTC Calculation: 479 R Axis:   71 Text Interpretation:  Sinus tachycardia Biatrial enlargement inferior baseline wander Confirmed by Davonna Belling [phone]) on 10/14/2017 9:09:59 AM       Radiology Dg Chest 2 View  Result Date:  10/14/2017 CLINICAL DATA:  56 year old female with elevated blood pressure. History of stage III lung cancer with chemotherapy and radiation. EXAM: CHEST  2 VIEW COMPARISON:  None. FINDINGS: Cardiomediastinal silhouette is unchanged in size and morphology. Persistent, increased since for stage lung linear densities in the right parahilar and infrahilar region again noted. This appears more contracted from comparison study. The left lung is clear. No sizeable effusions or pneumothorax. No acute osseous abnormality. Note is made of nipple shadows at the bilateral bases. IMPRESSION: The right perihilar and infrahilar linear opacities, slightly decreased and retracted in appearance from prior comparison study. These findings may reflect prior radiation changes, though  a component of new infiltrate or recurrence cannot be definitively excluded. Further evaluation with chest CT recommended. If the patient is not currently symptomatic for signs of acute infection, this can be done on a nonemergent basis. Electronically Signed   By: Kristopher Oppenheim M.D.   On: 10/14/2017 09:17    Procedures Procedures (including critical care time)  Medications Ordered in ED Medications - No data to display   Initial Impression / Assessment and Plan / ED Course  I have reviewed the triage vital signs and the nursing notes.  Pertinent labs & imaging results that were available during my care of the patient were reviewed by me and considered in my medical decision making (see chart for details).     Patient with palpitations and hypertension.  EKG and lab work reassuring.  X-ray shows chronic changes that likely could be due to her previous lung cancer.  Patient informed of need for follow-up though to make sure this is not a recurrence.  Low risk cardiac.  Doubt pulmonary embolism.  Will discharge home.  Final Clinical Impressions(s) / ED Diagnoses   Final diagnoses:  Hypertension, unspecified type    New  Prescriptions This SmartLink is deprecated. Use AVSMEDLIST instead to display the medication list for a patient.   Davonna Belling, MD 10/14/17 [phone]

## 2017-10-14 NOTE — ED Notes (Signed)
ED Provider at bedside. 

## 2017-10-14 NOTE — ED Notes (Signed)
Bed: BM15 Expected date:  Expected time:  Means of arrival:  Comments: Held for triage 1

## 2017-10-14 NOTE — Discharge Instructions (Signed)
Follow-up with your primary care doctor for further management.

## 2017-10-14 NOTE — ED Triage Notes (Signed)
Pt states she is a cancer survivor, has history of a fib, last night felt as if bp was high and heart beat was too fast.with some nausea. C/o headache which she feels is related to her HTN and continues to have chest tightness.

## 2017-11-27 ENCOUNTER — Telehealth: Payer: Self-pay

## 2017-11-27 NOTE — Telephone Encounter (Signed)
Sent notes to scheduling 

## 2017-11-28 DIAGNOSIS — T7840XA Allergy, unspecified, initial encounter: Secondary | ICD-10-CM | POA: Insufficient documentation

## 2017-11-28 DIAGNOSIS — J45909 Unspecified asthma, uncomplicated: Secondary | ICD-10-CM | POA: Insufficient documentation

## 2017-11-28 DIAGNOSIS — F419 Anxiety disorder, unspecified: Secondary | ICD-10-CM | POA: Insufficient documentation

## 2017-11-28 DIAGNOSIS — Z299 Encounter for prophylactic measures, unspecified: Secondary | ICD-10-CM | POA: Insufficient documentation

## 2017-11-28 DIAGNOSIS — I10 Essential (primary) hypertension: Secondary | ICD-10-CM | POA: Insufficient documentation

## 2017-12-13 ENCOUNTER — Ambulatory Visit: Payer: BLUE CROSS/BLUE SHIELD | Admitting: Cardiology

## 2017-12-19 ENCOUNTER — Encounter: Payer: Self-pay | Admitting: Cardiology

## 2017-12-19 ENCOUNTER — Ambulatory Visit: Payer: BLUE CROSS/BLUE SHIELD | Admitting: Cardiology

## 2017-12-19 VITALS — BP 120/90 | HR 106 | Ht 60.0 in | Wt 116.8 lb

## 2017-12-19 DIAGNOSIS — I480 Paroxysmal atrial fibrillation: Secondary | ICD-10-CM

## 2017-12-19 DIAGNOSIS — I10 Essential (primary) hypertension: Secondary | ICD-10-CM

## 2017-12-19 DIAGNOSIS — R002 Palpitations: Secondary | ICD-10-CM

## 2017-12-19 NOTE — Progress Notes (Signed)
Cardiology Office Note   Date:  12/19/2017   ID:  Brittany [name], DOB 08-Aug-1961, MRN [id number]  PCP:  System, Pcp Not In  Cardiologist:   Candee Furbish, MD   No chief complaint on file.     History of Present Illness: Brittany [name] is a 57 y.o. female who presents for evaluation of paroxysmal atrial fibrillation at the request of Maude Leriche, [place].  I saw her last a little less than 3 years ago.  She has been noticing at times some fast heart rates.  Sometimes this can occur at night, feels regular and fast.  Her EKG previously checked and current EKG shows sinus tachycardia heart rate of 103.  At one point, I had an EKG in 2015 that confirmed paroxysmal A. fib.  She was placed on Xarelto but only took this for a short while.    She also told me about some heartburn episodes that she has been having.  When asked about any exertional discomfort, sometimes she states she does feel a sensation in her chest with activity.  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 Had small cell lung cancer who was admitted on 03/15/15 to the hospital with implants of cough and shortness of breath, IV antibiotics were administered, blood culture no growth. She completed systemic chemotherapy with 6 cycles and radiation therapy. Was stage 3, Dr. Inda Merlin. In remission.   She is feeling better. Minimal cough. Occasionally will feel palpitations, heart racing.  EKG on 03/11/14 noted atrial fibrillation with rapid ventricular response. Other EKG showed PACs, one EKG showed multifocal atrial tachycardia.   Past Medical History:  Diagnosis Date  . Allergy   . Anxiety   . Asthma   . Atrial fibrillation ([place])   . Hypertension   . Prophylactic measure 08/03/14-08/19/14   Prophyl. cranial radiation 24 Gy  . Small cell lung cancer ([place]) 03/16/2014    Past Surgical History:  Procedure Laterality Date  . CESAREAN SECTION    . MEDIASTINOSCOPY N/A 03/11/2014     Procedure: MEDIASTINOSCOPY;  Surgeon: Melrose Nakayama, MD;  Location: [facility];  Service: Thoracic;  Laterality: N/A;  . TUBAL LIGATION    . VIDEO BRONCHOSCOPY WITH ENDOBRONCHIAL ULTRASOUND N/A 03/11/2014   Procedure: VIDEO BRONCHOSCOPY WITH ENDOBRONCHIAL ULTRASOUND;  Surgeon: Melrose Nakayama, MD;  Location: [facility];  Service: Thoracic;  Laterality: N/A;     Current Outpatient Medications  Medication Sig Dispense Refill  . albuterol (PROVENTIL HFA) 108 (90 BASE) MCG/ACT inhaler Inhale 2 puffs into the lungs every 4 (four) hours as needed for wheezing or shortness of breath. 6.7 each 5  . albuterol (PROVENTIL) (2.5 MG/3ML) 0.083% nebulizer solution Take 2.5 mg by nebulization every 6 (six) hours as needed for wheezing or shortness of breath (wheezing and shortness of breath).    . cetirizine (ZYRTEC) 10 MG tablet Take 10 mg daily as needed by mouth for allergies.    . fluticasone (FLONASE) 50 MCG/ACT nasal spray Place 2 sprays daily as needed into both nostrils for allergies.    Marland Kitchen lisinopril (PRINIVIL,ZESTRIL) 10 MG tablet Take 10 mg by mouth daily.  0   No current facility-administered medications for this visit.     Allergies:   Codeine; Latex; Pineapple; Iodinated diagnostic agents; and Sulfa antibiotics    Social History:  The patient  reports that she quit smoking about 3 years ago. Her smoking use included cigarettes. She has a 20.00 pack-year smoking history. she has never  used smokeless tobacco. She reports that she drinks about 4.8 oz of alcohol per week. She reports that she does not use drugs.   Family History:  The patient's family history includes Cancer in her maternal grandmother; Diabetes in her paternal grandmother; Heart attack in her mother; Heart disease in her mother; Hypertension in her maternal grandmother and mother.    ROS:  Please see the history of present illness.   Otherwise, review of systems are positive for none.   All other systems are reviewed and  negative.    PHYSICAL EXAM: VS:  BP 120/90   Pulse (!) 106   Ht 5' (1.524 m)   Wt 116 lb 12.8 oz (53 kg)   SpO2 96%   BMI 22.81 kg/m  , BMI Body mass index is 22.81 kg/m. GEN: Well nourished, well developed, in no acute distress  HEENT: normal  Neck: no JVD, carotid bruits, or masses Cardiac: Tachy reg; no murmurs, rubs, or gallops,no edema  Respiratory:  clear to auscultation bilaterally, normal work of breathing GI: soft, nontender, nondistended, + BS MS: no deformity or atrophy  Skin: warm and dry, no rash Neuro:  Alert and Oriented x 3, Strength and sensation are intact Psych: euthymic mood, full affect    EKG:  EKG is ordered today. The ekg ordered today demonstrates 12/19/17-sinus tachycardia 103 with no other abnormalities.  Personally reviewed.  03/29/15-sinus rhythm, 84, biatrial enlargement, vertical axis. No evidence of atrial fibrillation.   Prior EKG from 03/12/15 demonstrated atrial fibrillation with rapid ventricular response.   Recent Labs: 10/14/2017: BUN 12; Creatinine, Ser 0.70; Hemoglobin 18.1; Platelets 190; Potassium 4.4; Sodium 137    Lipid Panel    Component Value Date/Time   CHOL 279 (H) 01/01/2014 1111   TRIG 109 01/01/2014 1111   HDL 49 01/01/2014 1111   CHOLHDL 5.7 01/01/2014 1111   VLDL 22 01/01/2014 1111   LDLCALC 208 (H) 01/01/2014 1111      Wt Readings from Last 3 Encounters:  12/19/17 116 lb 12.8 oz (53 kg)  10/14/17 117 lb (53.1 kg)  10/14/15 107 lb 6 oz (48.7 kg)      Other studies Reviewed: Additional studies/ records that were reviewed today include: Hospital records reviewed, lab work reviewed, CT scan reviewed. Review of the above records demonstrates: As above   ASSESSMENT AND PLAN:  1.  Parox Atrial fibrillation-old EKG from 03/11/14 at 1707 demonstrates atrial fibrillation with rapid ventricular response heart rate 160. Other EKGs demonstrate sinus rhythm with PACs as well as multifocal atrial tachycardia.  EKG today shows  sinus tachycardia rate 103.  We discussed the risks and benefits of anticoagulation. Given her CHADS-VASc of 2, female, hypertension, anticoagulation recommended previously.  At this time, we will hold off on restarting anticoagulation until we have clear evidence that she is having atrial fibrillation.  I will place a 30-day event monitor on her.  She states that she feels the symptoms 2-3 times a week so she should be able to show [place] if this is atrial fibrillation.  2. Non-small cell lung cancer-per oncology. Remission.   3.  Chest discomfort-initially encouraged her to have a stress test.  Her mother had a massive heart attack and died in her 84s.  She stated that whenever she does a stress test her blood pressure gets so high that the doctors are called into the room.  She was worried about this.  I stated that if her symptoms became more progressive, that we should definitely consider  further evaluation.  We will follow-up with results of testing.  Signed, Candee Furbish, MD  12/19/2017 12:23 PM    [facility] [address], [place], [place]  [address] Phone: [phone]; Fax: [phone]

## 2017-12-19 NOTE — Patient Instructions (Signed)
Medication Instructions:  The current medical regimen is effective;  continue present plan and medications.  Testing/Procedures: Your physician has recommended that you wear an event monitor for 30 days. Event monitors are medical devices that record the heart's electrical activity. Doctors most often [place] these monitors to diagnose arrhythmias. Arrhythmias are problems with the speed or rhythm of the heartbeat. The monitor is a small, portable device. You can wear one while you do your normal daily activities. This is usually used to diagnose what is causing palpitations/syncope (passing out).  Follow-Up: Follow up as needed after the above testing.  If you need a refill on your cardiac medications before your next appointment, please call your pharmacy.  Thank you for choosing [facility]!!

## 2018-01-08 ENCOUNTER — Ambulatory Visit (INDEPENDENT_AMBULATORY_CARE_PROVIDER_SITE_OTHER): Payer: BLUE CROSS/BLUE SHIELD

## 2018-01-08 DIAGNOSIS — I480 Paroxysmal atrial fibrillation: Secondary | ICD-10-CM

## 2018-01-08 DIAGNOSIS — R002 Palpitations: Secondary | ICD-10-CM | POA: Diagnosis not present

## 2018-02-03 ENCOUNTER — Other Ambulatory Visit: Payer: Self-pay

## 2018-02-03 ENCOUNTER — Inpatient Hospital Stay (HOSPITAL_COMMUNITY)
Admission: EM | Admit: 2018-02-03 | Discharge: 2018-02-27 | DRG: 215 | Disposition: A | Discharge status: Skilled Nursing Facility | Payer: BLUE CROSS/BLUE SHIELD | Attending: Thoracic Surgery (Cardiothoracic Vascular Surgery) | Admitting: Thoracic Surgery (Cardiothoracic Vascular Surgery)

## 2018-02-03 ENCOUNTER — Emergency Department (HOSPITAL_COMMUNITY): Payer: BLUE CROSS/BLUE SHIELD

## 2018-02-03 ENCOUNTER — Inpatient Hospital Stay (HOSPITAL_COMMUNITY)
Admission: EM | Disposition: A | Discharge status: Skilled Nursing Facility | Payer: Self-pay | Source: Home / Self Care | Attending: Thoracic Surgery (Cardiothoracic Vascular Surgery)

## 2018-02-03 ENCOUNTER — Encounter (HOSPITAL_COMMUNITY): Payer: Self-pay | Admitting: Emergency Medicine

## 2018-02-03 ENCOUNTER — Emergency Department (HOSPITAL_COMMUNITY): Payer: BLUE CROSS/BLUE SHIELD | Admitting: Certified Registered"

## 2018-02-03 ENCOUNTER — Emergency Department (HOSPITAL_COMMUNITY): Payer: BLUE CROSS/BLUE SHIELD | Admitting: Anesthesiology

## 2018-02-03 ENCOUNTER — Inpatient Hospital Stay (HOSPITAL_COMMUNITY): Payer: BLUE CROSS/BLUE SHIELD

## 2018-02-03 ENCOUNTER — Encounter (HOSPITAL_COMMUNITY)
Admission: EM | Disposition: A | Discharge status: Skilled Nursing Facility | Payer: Self-pay | Source: Home / Self Care | Attending: Thoracic Surgery (Cardiothoracic Vascular Surgery)

## 2018-02-03 DIAGNOSIS — Z87891 Personal history of nicotine dependence: Secondary | ICD-10-CM

## 2018-02-03 DIAGNOSIS — Z8249 Family history of ischemic heart disease and other diseases of the circulatory system: Secondary | ICD-10-CM | POA: Diagnosis not present

## 2018-02-03 DIAGNOSIS — Z951 Presence of aortocoronary bypass graft: Secondary | ICD-10-CM

## 2018-02-03 DIAGNOSIS — J189 Pneumonia, unspecified organism: Secondary | ICD-10-CM | POA: Diagnosis not present

## 2018-02-03 DIAGNOSIS — I272 Pulmonary hypertension, unspecified: Secondary | ICD-10-CM | POA: Diagnosis present

## 2018-02-03 DIAGNOSIS — Z681 Body mass index (BMI) 19 or less, adult: Secondary | ICD-10-CM | POA: Diagnosis not present

## 2018-02-03 DIAGNOSIS — Z923 Personal history of irradiation: Secondary | ICD-10-CM

## 2018-02-03 DIAGNOSIS — E872 Acidosis: Secondary | ICD-10-CM | POA: Diagnosis present

## 2018-02-03 DIAGNOSIS — Y95 Nosocomial condition: Secondary | ICD-10-CM | POA: Diagnosis present

## 2018-02-03 DIAGNOSIS — I509 Heart failure, unspecified: Secondary | ICD-10-CM

## 2018-02-03 DIAGNOSIS — I110 Hypertensive heart disease with heart failure: Secondary | ICD-10-CM | POA: Diagnosis present

## 2018-02-03 DIAGNOSIS — D62 Acute posthemorrhagic anemia: Secondary | ICD-10-CM | POA: Diagnosis not present

## 2018-02-03 DIAGNOSIS — E875 Hyperkalemia: Secondary | ICD-10-CM | POA: Diagnosis not present

## 2018-02-03 DIAGNOSIS — I25110 Atherosclerotic heart disease of native coronary artery with unstable angina pectoris: Secondary | ICD-10-CM | POA: Diagnosis present

## 2018-02-03 DIAGNOSIS — F419 Anxiety disorder, unspecified: Secondary | ICD-10-CM | POA: Diagnosis present

## 2018-02-03 DIAGNOSIS — R079 Chest pain, unspecified: Secondary | ICD-10-CM | POA: Diagnosis not present

## 2018-02-03 DIAGNOSIS — I249 Acute ischemic heart disease, unspecified: Secondary | ICD-10-CM

## 2018-02-03 DIAGNOSIS — I351 Nonrheumatic aortic (valve) insufficiency: Secondary | ICD-10-CM | POA: Diagnosis present

## 2018-02-03 DIAGNOSIS — Z9221 Personal history of antineoplastic chemotherapy: Secondary | ICD-10-CM

## 2018-02-03 DIAGNOSIS — I10 Essential (primary) hypertension: Secondary | ICD-10-CM | POA: Diagnosis present

## 2018-02-03 DIAGNOSIS — I4891 Unspecified atrial fibrillation: Secondary | ICD-10-CM | POA: Diagnosis present

## 2018-02-03 DIAGNOSIS — J45909 Unspecified asthma, uncomplicated: Secondary | ICD-10-CM | POA: Diagnosis present

## 2018-02-03 DIAGNOSIS — R570 Cardiogenic shock: Secondary | ICD-10-CM | POA: Diagnosis not present

## 2018-02-03 DIAGNOSIS — I5021 Acute systolic (congestive) heart failure: Secondary | ICD-10-CM | POA: Diagnosis not present

## 2018-02-03 DIAGNOSIS — Z85118 Personal history of other malignant neoplasm of bronchus and lung: Secondary | ICD-10-CM | POA: Diagnosis not present

## 2018-02-03 DIAGNOSIS — Z95811 Presence of heart assist device: Secondary | ICD-10-CM | POA: Diagnosis not present

## 2018-02-03 DIAGNOSIS — J9601 Acute respiratory failure with hypoxia: Secondary | ICD-10-CM | POA: Diagnosis present

## 2018-02-03 DIAGNOSIS — I5023 Acute on chronic systolic (congestive) heart failure: Secondary | ICD-10-CM | POA: Diagnosis present

## 2018-02-03 DIAGNOSIS — R7303 Prediabetes: Secondary | ICD-10-CM | POA: Diagnosis present

## 2018-02-03 DIAGNOSIS — J9600 Acute respiratory failure, unspecified whether with hypoxia or hypercapnia: Secondary | ICD-10-CM | POA: Diagnosis not present

## 2018-02-03 DIAGNOSIS — Z79899 Other long term (current) drug therapy: Secondary | ICD-10-CM

## 2018-02-03 DIAGNOSIS — E440 Moderate protein-calorie malnutrition: Secondary | ICD-10-CM | POA: Diagnosis present

## 2018-02-03 DIAGNOSIS — D696 Thrombocytopenia, unspecified: Secondary | ICD-10-CM | POA: Diagnosis not present

## 2018-02-03 DIAGNOSIS — J9602 Acute respiratory failure with hypercapnia: Secondary | ICD-10-CM | POA: Diagnosis present

## 2018-02-03 DIAGNOSIS — I472 Ventricular tachycardia: Secondary | ICD-10-CM | POA: Diagnosis not present

## 2018-02-03 DIAGNOSIS — J811 Chronic pulmonary edema: Secondary | ICD-10-CM

## 2018-02-03 DIAGNOSIS — I480 Paroxysmal atrial fibrillation: Secondary | ICD-10-CM | POA: Diagnosis present

## 2018-02-03 DIAGNOSIS — R0602 Shortness of breath: Secondary | ICD-10-CM

## 2018-02-03 DIAGNOSIS — J9611 Chronic respiratory failure with hypoxia: Secondary | ICD-10-CM

## 2018-02-03 DIAGNOSIS — E876 Hypokalemia: Secondary | ICD-10-CM | POA: Diagnosis not present

## 2018-02-03 DIAGNOSIS — I447 Left bundle-branch block, unspecified: Secondary | ICD-10-CM

## 2018-02-03 DIAGNOSIS — I2102 ST elevation (STEMI) myocardial infarction involving left anterior descending coronary artery: Secondary | ICD-10-CM | POA: Diagnosis present

## 2018-02-03 DIAGNOSIS — R1310 Dysphagia, unspecified: Secondary | ICD-10-CM | POA: Diagnosis present

## 2018-02-03 DIAGNOSIS — Z452 Encounter for adjustment and management of vascular access device: Secondary | ICD-10-CM

## 2018-02-03 DIAGNOSIS — R300 Dysuria: Secondary | ICD-10-CM | POA: Diagnosis not present

## 2018-02-03 DIAGNOSIS — J9811 Atelectasis: Secondary | ICD-10-CM

## 2018-02-03 HISTORY — DX: Paroxysmal atrial fibrillation: I48.0

## 2018-02-03 HISTORY — PX: INTRAOPERATIVE TRANSESOPHAGEAL ECHOCARDIOGRAM: SHX5062

## 2018-02-03 HISTORY — DX: Presence of aortocoronary bypass graft: Z95.1

## 2018-02-03 HISTORY — PX: CORONARY/GRAFT ACUTE MI REVASCULARIZATION: CATH118305

## 2018-02-03 HISTORY — PX: LEFT HEART CATH AND CORONARY ANGIOGRAPHY: CATH118249

## 2018-02-03 HISTORY — DX: Acute systolic (congestive) heart failure: I50.21

## 2018-02-03 HISTORY — PX: CORONARY ARTERY BYPASS GRAFT: SHX141

## 2018-02-03 HISTORY — PX: RIGHT HEART CATH: CATH118263

## 2018-02-03 HISTORY — PX: PLACEMENT OF IMPELLA LEFT VENTRICULAR ASSIST DEVICE: SHX6519

## 2018-02-03 HISTORY — PX: IABP INSERTION: CATH118242

## 2018-02-03 HISTORY — PX: CORONARY BALLOON ANGIOPLASTY: CATH118233

## 2018-02-03 LAB — POCT I-STAT, CHEM 8
BUN: 22 mg/dL — ABNORMAL HIGH (ref 6–20)
BUN: 18 mg/dL (ref 6–20)
BUN: 18 mg/dL (ref 6–20)
BUN: 21 mg/dL — ABNORMAL HIGH (ref 6–20)
BUN: 19 mg/dL (ref 6–20)
BUN: 21 mg/dL — AB (ref 6–20)
BUN: 20 mg/dL (ref 6–20)
CALCIUM ION: 0.9 mmol/L — AB (ref 1.15–1.40)
CALCIUM ION: 1.18 mmol/L (ref 1.15–1.40)
CALCIUM ION: 0.95 mmol/L — AB (ref 1.15–1.40)
CALCIUM ION: 1.03 mmol/L — AB (ref 1.15–1.40)
CALCIUM ION: 1.02 mmol/L — AB (ref 1.15–1.40)
CHLORIDE: 110 mmol/L (ref 101–111)
CHLORIDE: 109 mmol/L (ref 101–111)
CHLORIDE: 111 mmol/L (ref 101–111)
CREATININE: 0.7 mg/dL (ref 0.44–1.00)
Calcium, Ion: 1.18 mmol/L (ref 1.15–1.40)
Calcium, Ion: 0.9 mmol/L — ABNORMAL LOW (ref 1.15–1.40)
Chloride: 107 mmol/L (ref 101–111)
Chloride: 106 mmol/L (ref 101–111)
Chloride: 110 mmol/L (ref 101–111)
Chloride: 110 mmol/L (ref 101–111)
Creatinine, Ser: 0.6 mg/dL (ref 0.44–1.00)
Creatinine, Ser: 0.7 mg/dL (ref 0.44–1.00)
Creatinine, Ser: 0.8 mg/dL (ref 0.44–1.00)
Creatinine, Ser: 0.7 mg/dL (ref 0.44–1.00)
Creatinine, Ser: 0.8 mg/dL (ref 0.44–1.00)
Creatinine, Ser: 0.8 mg/dL (ref 0.44–1.00)
GLUCOSE: 307 mg/dL — AB (ref 65–99)
GLUCOSE: 276 mg/dL — AB (ref 65–99)
GLUCOSE: 248 mg/dL — AB (ref 65–99)
Glucose, Bld: 249 mg/dL — ABNORMAL HIGH (ref 65–99)
Glucose, Bld: 253 mg/dL — ABNORMAL HIGH (ref 65–99)
Glucose, Bld: 251 mg/dL — ABNORMAL HIGH (ref 65–99)
Glucose, Bld: 226 mg/dL — ABNORMAL HIGH (ref 65–99)
HCT: 27 % — ABNORMAL LOW (ref 36.0–46.0)
HCT: 48 % — ABNORMAL HIGH (ref 36.0–46.0)
HCT: 28 % — ABNORMAL LOW (ref 36.0–46.0)
HCT: 33 % — ABNORMAL LOW (ref 36.0–46.0)
HEMATOCRIT: 56 % — AB (ref 36.0–46.0)
HEMATOCRIT: 29 % — AB (ref 36.0–46.0)
HEMATOCRIT: 29 % — AB (ref 36.0–46.0)
HEMOGLOBIN: 19 g/dL — AB (ref 12.0–15.0)
HEMOGLOBIN: 16.3 g/dL — AB (ref 12.0–15.0)
HEMOGLOBIN: 9.2 g/dL — AB (ref 12.0–15.0)
HEMOGLOBIN: 9.9 g/dL — AB (ref 12.0–15.0)
HEMOGLOBIN: 9.9 g/dL — AB (ref 12.0–15.0)
HEMOGLOBIN: 11.2 g/dL — AB (ref 12.0–15.0)
Hemoglobin: 9.5 g/dL — ABNORMAL LOW (ref 12.0–15.0)
POTASSIUM: 5.4 mmol/L — AB (ref 3.5–5.1)
POTASSIUM: 6.3 mmol/L — AB (ref 3.5–5.1)
Potassium: 3.5 mmol/L (ref 3.5–5.1)
Potassium: 3.8 mmol/L (ref 3.5–5.1)
Potassium: 5.3 mmol/L — ABNORMAL HIGH (ref 3.5–5.1)
Potassium: 5.2 mmol/L — ABNORMAL HIGH (ref 3.5–5.1)
Potassium: 4.1 mmol/L (ref 3.5–5.1)
SODIUM: 143 mmol/L (ref 135–145)
Sodium: 141 mmol/L (ref 135–145)
Sodium: 141 mmol/L (ref 135–145)
Sodium: 142 mmol/L (ref 135–145)
Sodium: 143 mmol/L (ref 135–145)
Sodium: 143 mmol/L (ref 135–145)
Sodium: 145 mmol/L (ref 135–145)
TCO2: 22 mmol/L (ref 22–32)
TCO2: 22 mmol/L (ref 22–32)
TCO2: 22 mmol/L (ref 22–32)
TCO2: 23 mmol/L (ref 22–32)
TCO2: 22 mmol/L (ref 22–32)
TCO2: 21 mmol/L — ABNORMAL LOW (ref 22–32)
TCO2: 20 mmol/L — ABNORMAL LOW (ref 22–32)

## 2018-02-03 LAB — CBC WITH DIFFERENTIAL/PLATELET
BASOS PCT: 0 %
Basophils Absolute: 0 10*3/uL (ref 0.0–0.1)
Eosinophils Absolute: 0 10*3/uL (ref 0.0–0.7)
Eosinophils Relative: 0 %
HCT: 53.9 % — ABNORMAL HIGH (ref 36.0–46.0)
HEMOGLOBIN: 18.1 g/dL — AB (ref 12.0–15.0)
Lymphocytes Relative: 8 %
Lymphs Abs: 0.9 10*3/uL (ref 0.7–4.0)
MCH: 34 pg (ref 26.0–34.0)
MCHC: 33.6 g/dL (ref 30.0–36.0)
MCV: 101.1 fL — ABNORMAL HIGH (ref 78.0–100.0)
MONOS PCT: 9 %
Monocytes Absolute: 1 10*3/uL (ref 0.1–1.0)
NEUTROS PCT: 83 %
Neutro Abs: 9.8 10*3/uL — ABNORMAL HIGH (ref 1.7–7.7)
Platelets: 204 10*3/uL (ref 150–400)
RBC: 5.33 MIL/uL — ABNORMAL HIGH (ref 3.87–5.11)
RDW: 20.9 % — AB (ref 11.5–15.5)
WBC: 11.7 10*3/uL — ABNORMAL HIGH (ref 4.0–10.5)

## 2018-02-03 LAB — COMPREHENSIVE METABOLIC PANEL
ALK PHOS: 76 U/L (ref 38–126)
ALT: 55 U/L — AB (ref 14–54)
ANION GAP: 19 — AB (ref 5–15)
AST: <5 U/L — ABNORMAL LOW (ref 15–41)
Albumin: 4.5 g/dL (ref 3.5–5.0)
BILIRUBIN TOTAL: 1.2 mg/dL (ref 0.3–1.2)
BUN: 23 mg/dL — ABNORMAL HIGH (ref 6–20)
CALCIUM: 10.3 mg/dL (ref 8.9–10.3)
CO2: 17 mmol/L — AB (ref 22–32)
Chloride: 99 mmol/L — ABNORMAL LOW (ref 101–111)
Creatinine, Ser: 1.17 mg/dL — ABNORMAL HIGH (ref 0.44–1.00)
GFR calc non Af Amer: 51 mL/min — ABNORMAL LOW (ref >60)
GFR, EST AFRICAN AMERICAN: 59 mL/min — AB (ref >60)
Glucose, Bld: 344 mg/dL — ABNORMAL HIGH (ref 65–99)
Potassium: 4 mmol/L (ref 3.5–5.1)
SODIUM: 135 mmol/L (ref 135–145)
TOTAL PROTEIN: 8.2 g/dL — AB (ref 6.5–8.1)

## 2018-02-03 LAB — POCT I-STAT 3, ART BLOOD GAS (G3+)
ACID-BASE DEFICIT: 4 mmol/L — AB (ref 0.0–2.0)
Acid-Base Excess: 3 mmol/L — ABNORMAL HIGH (ref 0.0–2.0)
Acid-base deficit: 8 mmol/L — ABNORMAL HIGH (ref 0.0–2.0)
Acid-base deficit: 9 mmol/L — ABNORMAL HIGH (ref 0.0–2.0)
Acid-base deficit: 4 mmol/L — ABNORMAL HIGH (ref 0.0–2.0)
BICARBONATE: 23.6 mmol/L (ref 20.0–28.0)
BICARBONATE: 28.4 mmol/L — AB (ref 20.0–28.0)
Bicarbonate: 22.1 mmol/L (ref 20.0–28.0)
Bicarbonate: 19.8 mmol/L — ABNORMAL LOW (ref 20.0–28.0)
Bicarbonate: 19.6 mmol/L — ABNORMAL LOW (ref 20.0–28.0)
O2 SAT: 99 %
O2 Saturation: 100 %
O2 Saturation: 98 %
O2 Saturation: 93 %
O2 Saturation: 100 %
PH ART: 7.33 — AB (ref 7.350–7.450)
PH ART: 7.2 — AB (ref 7.350–7.450)
PO2 ART: 183 mmHg — AB (ref 83.0–108.0)
TCO2: 23 mmol/L (ref 22–32)
TCO2: 21 mmol/L — AB (ref 22–32)
TCO2: 21 mmol/L — ABNORMAL LOW (ref 22–32)
TCO2: 25 mmol/L (ref 22–32)
TCO2: 30 mmol/L (ref 22–32)
pCO2 arterial: 41.9 mmHg (ref 32.0–48.0)
pCO2 arterial: 50.8 mmHg — ABNORMAL HIGH (ref 32.0–48.0)
pCO2 arterial: 53.6 mmHg — ABNORMAL HIGH (ref 32.0–48.0)
pCO2 arterial: 52.4 mmHg — ABNORMAL HIGH (ref 32.0–48.0)
pCO2 arterial: 50.1 mmHg — ABNORMAL HIGH (ref 32.0–48.0)
pH, Arterial: 7.172 — CL (ref 7.350–7.450)
pH, Arterial: 7.251 — ABNORMAL LOW (ref 7.350–7.450)
pH, Arterial: 7.361 (ref 7.350–7.450)
pO2, Arterial: 410 mmHg — ABNORMAL HIGH (ref 83.0–108.0)
pO2, Arterial: 145 mmHg — ABNORMAL HIGH (ref 83.0–108.0)
pO2, Arterial: 135 mmHg — ABNORMAL HIGH (ref 83.0–108.0)
pO2, Arterial: 69 mmHg — ABNORMAL LOW (ref 83.0–108.0)

## 2018-02-03 LAB — BASIC METABOLIC PANEL
Anion gap: 11 (ref 5–15)
BUN: 15 mg/dL (ref 6–20)
CO2: 22 mmol/L (ref 22–32)
Calcium: 7.1 mg/dL — ABNORMAL LOW (ref 8.9–10.3)
Chloride: 111 mmol/L (ref 101–111)
Creatinine, Ser: 0.97 mg/dL (ref 0.44–1.00)
GFR calc Af Amer: >60 mL/min (ref >60)
GLUCOSE: 117 mg/dL — AB (ref 65–99)
Potassium: 4.1 mmol/L (ref 3.5–5.1)
Sodium: 144 mmol/L (ref 135–145)

## 2018-02-03 LAB — PROTIME-INR
INR: 3.07
INR: 2.34
Prothrombin Time: 31.5 seconds — ABNORMAL HIGH (ref 11.4–15.2)
Prothrombin Time: 25.5 seconds — ABNORMAL HIGH (ref 11.4–15.2)

## 2018-02-03 LAB — CBC
HEMATOCRIT: 25.6 % — AB (ref 36.0–46.0)
HEMOGLOBIN: 8.4 g/dL — AB (ref 12.0–15.0)
MCH: 33.3 pg (ref 26.0–34.0)
MCHC: 32.8 g/dL (ref 30.0–36.0)
MCV: 101.6 fL — AB (ref 78.0–100.0)
Platelets: 189 10*3/uL (ref 150–400)
RBC: 2.52 MIL/uL — AB (ref 3.87–5.11)
RDW: 20.5 % — ABNORMAL HIGH (ref 11.5–15.5)
WBC: 9 10*3/uL (ref 4.0–10.5)

## 2018-02-03 LAB — PLATELET COUNT
Platelets: 109 10*3/uL — ABNORMAL LOW (ref 150–400)
Platelets: 65 10*3/uL — ABNORMAL LOW (ref 150–400)

## 2018-02-03 LAB — PREPARE RBC (CROSSMATCH)

## 2018-02-03 LAB — POCT I-STAT 4, (NA,K, GLUC, HGB,HCT)
Glucose, Bld: 167 mg/dL — ABNORMAL HIGH (ref 65–99)
HEMATOCRIT: 25 % — AB (ref 36.0–46.0)
Hemoglobin: 8.5 g/dL — ABNORMAL LOW (ref 12.0–15.0)
Potassium: 3.6 mmol/L (ref 3.5–5.1)
Sodium: 149 mmol/L — ABNORMAL HIGH (ref 135–145)

## 2018-02-03 LAB — TROPONIN I: Troponin I: >65 ng/mL (ref <0.03)

## 2018-02-03 LAB — LACTIC ACID, PLASMA: LACTIC ACID, VENOUS: 3.1 mmol/L — AB (ref 0.5–1.9)

## 2018-02-03 LAB — SURGICAL PCR SCREEN
MRSA, PCR: NEGATIVE
STAPHYLOCOCCUS AUREUS: NEGATIVE

## 2018-02-03 LAB — HEMOGLOBIN AND HEMATOCRIT, BLOOD
HCT: 31.9 % — ABNORMAL LOW (ref 36.0–46.0)
HEMATOCRIT: 27.8 % — AB (ref 36.0–46.0)
HEMOGLOBIN: 10.4 g/dL — AB (ref 12.0–15.0)
Hemoglobin: 9 g/dL — ABNORMAL LOW (ref 12.0–15.0)

## 2018-02-03 LAB — LIPASE, BLOOD: Lipase: 34 U/L (ref 11–51)

## 2018-02-03 LAB — BRAIN NATRIURETIC PEPTIDE: B Natriuretic Peptide: 3127.4 pg/mL — ABNORMAL HIGH (ref 0.0–100.0)

## 2018-02-03 LAB — APTT
APTT: 82 s — AB (ref 24–36)
aPTT: 55 seconds — ABNORMAL HIGH (ref 24–36)

## 2018-02-03 LAB — I-STAT TROPONIN, ED

## 2018-02-03 LAB — FIBRINOGEN: Fibrinogen: 201 mg/dL — ABNORMAL LOW (ref 210–475)

## 2018-02-03 LAB — D-DIMER, QUANTITATIVE: D-Dimer, Quant: 1.07 ug/mL-FEU — ABNORMAL HIGH (ref 0.00–0.50)

## 2018-02-03 LAB — I-STAT CG4 LACTIC ACID, ED: LACTIC ACID, VENOUS: 7.24 mmol/L — AB (ref 0.5–1.9)

## 2018-02-03 SURGERY — ECHOCARDIOGRAM, TRANSESOPHAGEAL, INTRAOPERATIVE
Anesthesia: General | Site: Chest

## 2018-02-03 SURGERY — CORONARY/GRAFT ACUTE MI REVASCULARIZATION
Anesthesia: LOCAL

## 2018-02-03 MED ORDER — FENTANYL CITRATE (PF) 100 MCG/2ML IJ SOLN
50.0000 ug | Freq: Once | INTRAMUSCULAR | Status: AC
  Administered 2018-02-04: 50 ug via INTRAVENOUS

## 2018-02-03 MED ORDER — NOREPINEPHRINE 4 MG/250ML-% IV SOLN
INTRAVENOUS | Status: AC
  Filled 2018-02-03: qty 250

## 2018-02-03 MED ORDER — SODIUM CHLORIDE 0.9 % IV SOLN
INTRAVENOUS | Status: AC
  Administered 2018-02-03: 100 mL/h via INTRAVENOUS

## 2018-02-03 MED ORDER — SODIUM CHLORIDE 0.9 % IV SOLN
30.0000 ug/min | INTRAVENOUS | Status: DC
  Filled 2018-02-03: qty 2

## 2018-02-03 MED ORDER — POTASSIUM CHLORIDE 2 MEQ/ML IV SOLN
80.0000 meq | INTRAVENOUS | Status: DC
  Filled 2018-02-03 (×2): qty 40

## 2018-02-03 MED ORDER — LACTATED RINGERS IV SOLN
INTRAVENOUS | Status: DC | PRN
  Administered 2018-02-03: 13:00:00 via INTRAVENOUS

## 2018-02-03 MED ORDER — DEXTROSE 5 % IV SOLN
INTRAVENOUS | Status: DC | PRN
  Administered 2018-02-03: 25 ug/min via INTRAVENOUS

## 2018-02-03 MED ORDER — ETOMIDATE 2 MG/ML IV SOLN
INTRAVENOUS | Status: DC | PRN
  Administered 2018-02-03: 12 mg via INTRAVENOUS

## 2018-02-03 MED ORDER — EPINEPHRINE PF 1 MG/ML IJ SOLN
0.0000 ug/min | INTRAVENOUS | Status: AC
  Administered 2018-02-03: 1 ug/min via INTRAVENOUS
  Filled 2018-02-03: qty 4

## 2018-02-03 MED ORDER — AMIODARONE HCL 150 MG/3ML IV SOLN
INTRAVENOUS | Status: AC
  Filled 2018-02-03: qty 3

## 2018-02-03 MED ORDER — BISACODYL 5 MG PO TBEC: 5.0000 mg | DELAYED_RELEASE_TABLET | Freq: Once | ORAL | Status: DC

## 2018-02-03 MED ORDER — POTASSIUM CHLORIDE 10 MEQ/50ML IV SOLN
10.0000 meq | INTRAVENOUS | Status: AC
  Administered 2018-02-03 (×3): 10 meq via INTRAVENOUS

## 2018-02-03 MED ORDER — MIDAZOLAM HCL 2 MG/2ML IJ SOLN
2.0000 mg | INTRAMUSCULAR | Status: DC | PRN
  Administered 2018-02-04 – 2018-02-05 (×2): 2 mg via INTRAVENOUS
  Filled 2018-02-03 (×2): qty 2

## 2018-02-03 MED ORDER — SODIUM CHLORIDE 0.9% FLUSH
3.0000 mL | Freq: Two times a day (BID) | INTRAVENOUS | Status: DC
  Administered 2018-02-04 – 2018-02-07 (×4): 3 mL via INTRAVENOUS

## 2018-02-03 MED ORDER — HEPARIN SODIUM (PORCINE) 1000 UNIT/ML IJ SOLN
INTRAMUSCULAR | Status: DC | PRN
  Administered 2018-02-03: 19000 [IU] via INTRAVENOUS

## 2018-02-03 MED ORDER — BISACODYL 5 MG PO TBEC: 10.0000 mg | DELAYED_RELEASE_TABLET | Freq: Every day | ORAL | Status: DC

## 2018-02-03 MED ORDER — SODIUM CHLORIDE 0.9 % IV SOLN
0.0000 ug/min | INTRAVENOUS | Status: DC
  Filled 2018-02-03: qty 2

## 2018-02-03 MED ORDER — NITROGLYCERIN 1 MG/10 ML FOR IR/CATH LAB
INTRA_ARTERIAL | Status: AC
  Filled 2018-02-03: qty 10

## 2018-02-03 MED ORDER — FENTANYL BOLUS VIA INFUSION
50.0000 ug | INTRAVENOUS | Status: DC | PRN
  Administered 2018-02-05 – 2018-02-07 (×4): 50 ug via INTRAVENOUS
  Filled 2018-02-03: qty 50

## 2018-02-03 MED ORDER — SODIUM CHLORIDE 0.9 % IV SOLN: INTRAVENOUS | Status: DC

## 2018-02-03 MED ORDER — SODIUM CHLORIDE 0.9 % IV SOLN
INTRAVENOUS | Status: DC | PRN
  Administered 2018-02-03: 1.75 mg/kg/h via INTRAVENOUS

## 2018-02-03 MED ORDER — SODIUM CHLORIDE 0.9 % IV SOLN
1250.0000 mg | INTRAVENOUS | Status: DC
  Filled 2018-02-03: qty 1250

## 2018-02-03 MED ORDER — SODIUM CHLORIDE 0.9 % IV SOLN
1.5000 g | INTRAVENOUS | Status: DC
  Filled 2018-02-03: qty 1.5

## 2018-02-03 MED ORDER — METOPROLOL TARTRATE 5 MG/5ML IV SOLN: 2.5000 mg | INTRAVENOUS | Status: DC | PRN

## 2018-02-03 MED ORDER — TRAMADOL HCL 50 MG PO TABS: 50.0000 mg | ORAL_TABLET | ORAL | Status: DC | PRN

## 2018-02-03 MED ORDER — PAPAVERINE HCL 30 MG/ML IJ SOLN
INTRAMUSCULAR | Status: AC
  Administered 2018-02-03: 500 mL
  Filled 2018-02-03: qty 2.5

## 2018-02-03 MED ORDER — TICAGRELOR 90 MG PO TABS
ORAL_TABLET | ORAL | Status: AC
  Filled 2018-02-03: qty 2

## 2018-02-03 MED ORDER — SODIUM CHLORIDE 0.45 % IV SOLN
INTRAVENOUS | Status: DC | PRN
  Administered 2018-02-03 (×2): 10 mL/h via INTRAVENOUS
  Administered 2018-02-04 – 2018-02-06 (×2): via INTRAVENOUS

## 2018-02-03 MED ORDER — CHLORHEXIDINE GLUCONATE 0.12 % MT SOLN
15.0000 mL | OROMUCOSAL | Status: AC
  Administered 2018-02-03: 15 mL via OROMUCOSAL

## 2018-02-03 MED ORDER — LACTATED RINGERS IV SOLN
INTRAVENOUS | Status: DC | PRN
  Administered 2018-02-03: 12:00:00 via INTRAVENOUS

## 2018-02-03 MED ORDER — SODIUM CHLORIDE 0.9 % IV SOLN: Freq: Once | INTRAVENOUS | Status: AC

## 2018-02-03 MED ORDER — DOCUSATE SODIUM 50 MG/5ML PO LIQD
100.0000 mg | Freq: Two times a day (BID) | ORAL | Status: DC
  Administered 2018-02-04 – 2018-02-07 (×5): 100 mg
  Filled 2018-02-03 (×6): qty 10

## 2018-02-03 MED ORDER — SODIUM CHLORIDE 0.9 % IV SOLN
0.0000 ug/h | INTRAVENOUS | Status: DC
  Administered 2018-02-03: 50 ug/h via INTRAVENOUS
  Filled 2018-02-03: qty 50

## 2018-02-03 MED ORDER — DOPAMINE-DEXTROSE 3.2-5 MG/ML-% IV SOLN
0.0000 ug/kg/min | INTRAVENOUS | Status: DC
  Filled 2018-02-03: qty 250

## 2018-02-03 MED ORDER — IOPAMIDOL (ISOVUE-370) INJECTION 76%
INTRAVENOUS | Status: DC | PRN
  Administered 2018-02-03: 90 mL via INTRA_ARTERIAL

## 2018-02-03 MED ORDER — ACETAMINOPHEN 160 MG/5ML PO SOLN
650.0000 mg | Freq: Four times a day (QID) | ORAL | Status: DC | PRN
  Administered 2018-02-04 (×2): 650 mg
  Filled 2018-02-03 (×2): qty 20.3

## 2018-02-03 MED ORDER — BIVALIRUDIN TRIFLUOROACETATE 250 MG IV SOLR
INTRAVENOUS | Status: AC
  Filled 2018-02-03: qty 250

## 2018-02-03 MED ORDER — HEPARIN (PORCINE) IN NACL 100-0.45 UNIT/ML-% IJ SOLN
650.0000 [IU]/h | INTRAMUSCULAR | Status: DC
  Filled 2018-02-03: qty 250

## 2018-02-03 MED ORDER — PROPOFOL 10 MG/ML IV BOLUS
INTRAVENOUS | Status: AC
  Filled 2018-02-03: qty 20

## 2018-02-03 MED ORDER — DOCUSATE SODIUM 100 MG PO CAPS: 200.0000 mg | ORAL_CAPSULE | Freq: Every day | ORAL | Status: DC

## 2018-02-03 MED ORDER — SODIUM CHLORIDE 0.9 % IJ SOLN
OROMUCOSAL | Status: DC | PRN
  Administered 2018-02-03 (×3): 4 mL via TOPICAL

## 2018-02-03 MED ORDER — HEPARIN (PORCINE) IN NACL 2-0.9 UNIT/ML-% IJ SOLN
INTRAMUSCULAR | Status: AC
  Filled 2018-02-03: qty 1000

## 2018-02-03 MED ORDER — IPRATROPIUM-ALBUTEROL 0.5-2.5 (3) MG/3ML IN SOLN
3.0000 mL | RESPIRATORY_TRACT | Status: DC
  Administered 2018-02-03 – 2018-02-06 (×18): 3 mL via RESPIRATORY_TRACT
  Filled 2018-02-03 (×9): qty 3
  Filled 2018-02-03: qty 6
  Filled 2018-02-03 (×7): qty 3

## 2018-02-03 MED ORDER — MILRINONE LACTATE IN DEXTROSE 20-5 MG/100ML-% IV SOLN
INTRAVENOUS | Status: AC | PRN
  Administered 2018-02-03: 0.25 ug/kg/min via INTRAVENOUS

## 2018-02-03 MED ORDER — SODIUM BICARBONATE 8.4 % IV SOLN
INTRAVENOUS | Status: DC | PRN
  Administered 2018-02-03: 50 meq via INTRAVENOUS

## 2018-02-03 MED ORDER — DEXMEDETOMIDINE HCL IN NACL 400 MCG/100ML IV SOLN
0.1000 ug/kg/h | INTRAVENOUS | Status: DC
  Filled 2018-02-03: qty 100

## 2018-02-03 MED ORDER — POTASSIUM CHLORIDE 10 MEQ/50ML IV SOLN: 10.0000 meq | INTRAVENOUS | Status: DC

## 2018-02-03 MED ORDER — OXYCODONE HCL 5 MG PO TABS: 5.0000 mg | ORAL_TABLET | ORAL | Status: DC | PRN

## 2018-02-03 MED ORDER — SODIUM CHLORIDE 0.9 % IV SOLN
0.5000 mg/h | INTRAVENOUS | Status: DC
  Administered 2018-02-03: 2 mg/h via INTRAVENOUS
  Filled 2018-02-03: qty 10

## 2018-02-03 MED ORDER — NITROGLYCERIN IN D5W 200-5 MCG/ML-% IV SOLN
2.0000 ug/min | INTRAVENOUS | Status: DC
  Filled 2018-02-03: qty 250

## 2018-02-03 MED ORDER — VANCOMYCIN HCL IN DEXTROSE 1-5 GM/200ML-% IV SOLN
1000.0000 mg | Freq: Once | INTRAVENOUS | Status: DC
  Filled 2018-02-03: qty 200

## 2018-02-03 MED ORDER — MILRINONE LACTATE IN DEXTROSE 20-5 MG/100ML-% IV SOLN
0.2500 ug/kg/min | INTRAVENOUS | Status: DC
  Administered 2018-02-03: 0.25 ug/kg/min via INTRAVENOUS
  Filled 2018-02-03 (×2): qty 100

## 2018-02-03 MED ORDER — HEMOSTATIC AGENTS (NO CHARGE) OPTIME
TOPICAL | Status: DC | PRN
  Administered 2018-02-03: 1 via TOPICAL

## 2018-02-03 MED ORDER — FUROSEMIDE 10 MG/ML IJ SOLN
INTRAMUSCULAR | Status: DC | PRN
  Administered 2018-02-03: 80 mg via INTRAVENOUS

## 2018-02-03 MED ORDER — DIPHENHYDRAMINE HCL 50 MG/ML IJ SOLN
INTRAMUSCULAR | Status: DC | PRN
  Administered 2018-02-03: 25 mg via INTRAVENOUS

## 2018-02-03 MED ORDER — CHLORHEXIDINE GLUCONATE CLOTH 2 % EX PADS
6.0000 | MEDICATED_PAD | Freq: Once | CUTANEOUS | Status: DC
  Administered 2018-02-04: 6 via TOPICAL

## 2018-02-03 MED ORDER — INSULIN REGULAR HUMAN 100 UNIT/ML IJ SOLN
INTRAMUSCULAR | Status: DC
  Filled 2018-02-03: qty 1

## 2018-02-03 MED ORDER — LACTATED RINGERS IV SOLN: INTRAVENOUS | Status: DC

## 2018-02-03 MED ORDER — MORPHINE SULFATE (PF) 4 MG/ML IV SOLN: 1.0000 mg | INTRAVENOUS | Status: DC | PRN

## 2018-02-03 MED ORDER — TRANEXAMIC ACID 1000 MG/10ML IV SOLN
INTRAVENOUS | Status: DC | PRN
  Administered 2018-02-03: 15 mg/kg/h via INTRAVENOUS

## 2018-02-03 MED ORDER — VANCOMYCIN HCL 1000 MG IV SOLR
INTRAVENOUS | Status: DC | PRN
  Administered 2018-02-03: 1000 mg via INTRAVENOUS

## 2018-02-03 MED ORDER — DEXMEDETOMIDINE HCL IN NACL 200 MCG/50ML IV SOLN
INTRAVENOUS | Status: DC | PRN
  Administered 2018-02-03: .3 ug/kg/h via INTRAVENOUS

## 2018-02-03 MED ORDER — LIDOCAINE HCL (PF) 1 % IJ SOLN
INTRAMUSCULAR | Status: DC | PRN
  Administered 2018-02-03: 15 mL via SUBCUTANEOUS
  Administered 2018-02-03: 10 mL via SUBCUTANEOUS
  Administered 2018-02-03: 2 mL via SUBCUTANEOUS

## 2018-02-03 MED ORDER — ONDANSETRON HCL 4 MG/2ML IJ SOLN: 4.0000 mg | Freq: Four times a day (QID) | INTRAMUSCULAR | Status: DC | PRN

## 2018-02-03 MED ORDER — MORPHINE SULFATE (PF) 4 MG/ML IV SOLN
1.0000 mg | INTRAVENOUS | Status: DC | PRN
Start: 2018-02-03 — End: 2018-02-03

## 2018-02-03 MED ORDER — NOREPINEPHRINE BITARTRATE 1 MG/ML IV SOLN
0.0000 ug/min | INTRAVENOUS | Status: DC
  Administered 2018-02-04: 8 ug/min via INTRAVENOUS
  Administered 2018-02-05: 14 ug/min via INTRAVENOUS
  Administered 2018-02-05: 8 ug/min via INTRAVENOUS
  Filled 2018-02-03 (×4): qty 4

## 2018-02-03 MED ORDER — NITROGLYCERIN IN D5W 200-5 MCG/ML-% IV SOLN: 0.0000 ug/min | INTRAVENOUS | Status: DC

## 2018-02-03 MED ORDER — ALBUMIN HUMAN 5 % IV SOLN
INTRAVENOUS | Status: DC | PRN
  Administered 2018-02-03 (×3): via INTRAVENOUS

## 2018-02-03 MED ORDER — FAMOTIDINE 20 MG IN NS 100 ML IVPB
20.0000 mg | Freq: Two times a day (BID) | INTRAVENOUS | Status: AC
  Administered 2018-02-03: 20 mg via INTRAVENOUS
  Filled 2018-02-03 (×2): qty 100

## 2018-02-03 MED ORDER — HEPARIN (PORCINE) IN NACL 2-0.9 UNIT/ML-% IJ SOLN
INTRAMUSCULAR | Status: AC
  Filled 2018-02-03: qty 500

## 2018-02-03 MED ORDER — ORAL CARE MOUTH RINSE
15.0000 mL | Freq: Four times a day (QID) | OROMUCOSAL | Status: DC
  Administered 2018-02-04 – 2018-02-05 (×7): 15 mL via OROMUCOSAL

## 2018-02-03 MED ORDER — ALBUTEROL SULFATE (2.5 MG/3ML) 0.083% IN NEBU
5.0000 mg | INHALATION_SOLUTION | Freq: Once | RESPIRATORY_TRACT | Status: AC
  Administered 2018-02-03: 5 mg via RESPIRATORY_TRACT
  Filled 2018-02-03: qty 6

## 2018-02-03 MED ORDER — HEPARIN (PORCINE) IN NACL 2-0.9 UNIT/ML-% IJ SOLN
INTRAMUSCULAR | Status: AC | PRN
  Administered 2018-02-03: 2000 mL

## 2018-02-03 MED ORDER — MIDAZOLAM HCL 10 MG/2ML IJ SOLN
INTRAMUSCULAR | Status: AC
  Filled 2018-02-03: qty 2

## 2018-02-03 MED ORDER — METHYLPREDNISOLONE SODIUM SUCC 125 MG IJ SOLR
INTRAMUSCULAR | Status: AC
  Filled 2018-02-03: qty 2

## 2018-02-03 MED ORDER — ACETAMINOPHEN 325 MG PO TABS
650.0000 mg | ORAL_TABLET | Freq: Four times a day (QID) | ORAL | Status: DC | PRN
Start: 2018-02-03 — End: 2018-02-08

## 2018-02-03 MED ORDER — LACTATED RINGERS IV SOLN: 500.0000 mL | Freq: Once | INTRAVENOUS | Status: DC | PRN

## 2018-02-03 MED ORDER — HEPARIN SODIUM (PORCINE) 1000 UNIT/ML IJ SOLN
INTRAMUSCULAR | Status: AC
  Filled 2018-02-03: qty 1

## 2018-02-03 MED ORDER — SODIUM BICARBONATE 8.4 % IV SOLN
INTRAVENOUS | Status: AC
  Filled 2018-02-03: qty 50

## 2018-02-03 MED ORDER — NOREPINEPHRINE BITARTRATE 1 MG/ML IV SOLN
INTRAVENOUS | Status: DC | PRN
  Administered 2018-02-03: 10 ug/min via INTRAVENOUS
  Administered 2018-02-03: 15 ug/min via INTRAVENOUS
  Administered 2018-02-03: 30 ug/min via INTRAVENOUS

## 2018-02-03 MED ORDER — LIDOCAINE HCL (PF) 1 % IJ SOLN
INTRAMUSCULAR | Status: AC
  Filled 2018-02-03: qty 30

## 2018-02-03 MED ORDER — SODIUM CHLORIDE 0.9 % IV BOLUS (SEPSIS): 1000.0000 mL | Freq: Once | INTRAVENOUS | Status: DC

## 2018-02-03 MED ORDER — SODIUM CHLORIDE 0.9 % IV SOLN: 250.0000 mL | INTRAVENOUS | Status: DC

## 2018-02-03 MED ORDER — MAGNESIUM SULFATE 50 % IJ SOLN
40.0000 meq | INTRAMUSCULAR | Status: DC
  Filled 2018-02-03: qty 9.85

## 2018-02-03 MED ORDER — TEMAZEPAM 15 MG PO CAPS: 15.0000 mg | ORAL_CAPSULE | Freq: Once | ORAL | Status: DC | PRN

## 2018-02-03 MED ORDER — ACETAMINOPHEN 160 MG/5ML PO SOLN: 1000.0000 mg | Freq: Four times a day (QID) | ORAL | Status: DC

## 2018-02-03 MED ORDER — VANCOMYCIN HCL 1000 MG IV SOLR
INTRAVENOUS | Status: DC
  Filled 2018-02-03: qty 1000

## 2018-02-03 MED ORDER — ACETAMINOPHEN 650 MG RE SUPP: 650.0000 mg | Freq: Once | RECTAL | Status: DC

## 2018-02-03 MED ORDER — ALBUMIN HUMAN 5 % IV SOLN
250.0000 mL | INTRAVENOUS | Status: AC | PRN
  Administered 2018-02-03: 250 mL via INTRAVENOUS

## 2018-02-03 MED ORDER — VANCOMYCIN HCL IN DEXTROSE 1-5 GM/200ML-% IV SOLN
1000.0000 mg | Freq: Once | INTRAVENOUS | Status: AC
  Administered 2018-02-04: 1000 mg via INTRAVENOUS
  Filled 2018-02-03: qty 200

## 2018-02-03 MED ORDER — METOPROLOL TARTRATE 12.5 MG HALF TABLET: 12.5000 mg | ORAL_TABLET | Freq: Once | ORAL | Status: DC

## 2018-02-03 MED ORDER — MAGNESIUM SULFATE 4 GM/100ML IV SOLN
4.0000 g | Freq: Once | INTRAVENOUS | Status: AC
  Administered 2018-02-03: 4 g via INTRAVENOUS
  Filled 2018-02-03: qty 100

## 2018-02-03 MED ORDER — SODIUM CHLORIDE 0.9 % IV SOLN
INTRAVENOUS | Status: DC
  Filled 2018-02-03: qty 30

## 2018-02-03 MED ORDER — EPINEPHRINE PF 1 MG/ML IJ SOLN
0.0000 ug/min | INTRAVENOUS | Status: DC
  Administered 2018-02-04: 2 ug/min via INTRAVENOUS
  Administered 2018-02-04: 0.5 ug/min via INTRAVENOUS
  Administered 2018-02-05 – 2018-02-06 (×2): 2 ug/min via INTRAVENOUS
  Filled 2018-02-03 (×4): qty 4

## 2018-02-03 MED ORDER — IOPAMIDOL (ISOVUE-370) INJECTION 76%
INTRAVENOUS | Status: AC
  Filled 2018-02-03: qty 150

## 2018-02-03 MED ORDER — FENTANYL CITRATE (PF) 100 MCG/2ML IJ SOLN
INTRAMUSCULAR | Status: DC | PRN
  Administered 2018-02-03: 50 ug via INTRAVENOUS
  Administered 2018-02-03: 25 ug via INTRAVENOUS

## 2018-02-03 MED ORDER — ACETAMINOPHEN 500 MG PO TABS: 1000.0000 mg | ORAL_TABLET | Freq: Four times a day (QID) | ORAL | Status: DC

## 2018-02-03 MED ORDER — ASPIRIN 81 MG PO CHEW
324.0000 mg | CHEWABLE_TABLET | Freq: Once | ORAL | Status: AC
  Administered 2018-02-03: 324 mg via ORAL
  Filled 2018-02-03: qty 4

## 2018-02-03 MED ORDER — SODIUM CHLORIDE 0.9 % IV SOLN
750.0000 mg | INTRAVENOUS | Status: DC
  Filled 2018-02-03: qty 750

## 2018-02-03 MED ORDER — SODIUM CHLORIDE 0.9 % IV SOLN
1.5000 g | Freq: Two times a day (BID) | INTRAVENOUS | Status: AC
  Administered 2018-02-03 – 2018-02-05 (×4): 1.5 g via INTRAVENOUS
  Filled 2018-02-03 (×5): qty 1.5

## 2018-02-03 MED ORDER — CHLORHEXIDINE GLUCONATE 0.12 % MT SOLN: 15.0000 mL | Freq: Once | OROMUCOSAL | Status: DC

## 2018-02-03 MED ORDER — PROPOFOL 10 MG/ML IV BOLUS
INTRAVENOUS | Status: DC | PRN
  Administered 2018-02-03: 40 mg via INTRAVENOUS

## 2018-02-03 MED ORDER — 0.9 % SODIUM CHLORIDE (POUR BTL) OPTIME
TOPICAL | Status: DC | PRN
  Administered 2018-02-03: 1000 mL

## 2018-02-03 MED ORDER — ASPIRIN EC 325 MG PO TBEC: 325.0000 mg | DELAYED_RELEASE_TABLET | Freq: Every day | ORAL | Status: DC

## 2018-02-03 MED ORDER — DEXTROSE 5 % IV SOLN
INTRAVENOUS | Status: AC | PRN
  Administered 2018-02-03: 10 ug/min via INTRAVENOUS

## 2018-02-03 MED ORDER — MIDAZOLAM HCL 2 MG/2ML IJ SOLN
INTRAMUSCULAR | Status: DC | PRN
  Administered 2018-02-03 (×4): 2 mg via INTRAVENOUS

## 2018-02-03 MED ORDER — MORPHINE SULFATE (PF) 2 MG/ML IV SOLN: 1.0000 mg | INTRAVENOUS | Status: DC | PRN

## 2018-02-03 MED ORDER — SODIUM CHLORIDE 0.9 % IV SOLN
INTRAVENOUS | Status: DC | PRN
  Administered 2018-02-03: 2 [IU]/h via INTRAVENOUS

## 2018-02-03 MED ORDER — FENTANYL CITRATE (PF) 250 MCG/5ML IJ SOLN
INTRAMUSCULAR | Status: AC
  Filled 2018-02-03: qty 25

## 2018-02-03 MED ORDER — FENTANYL 2500MCG IN NS 250ML (10MCG/ML) PREMIX INFUSION
25.0000 ug/h | INTRAVENOUS | Status: DC
  Administered 2018-02-04: 25 ug/h via INTRAVENOUS
  Administered 2018-02-04 – 2018-02-05 (×2): 200 ug/h via INTRAVENOUS
  Administered 2018-02-05: 225 ug/h via INTRAVENOUS
  Administered 2018-02-05: 50 ug/h via INTRAVENOUS
  Administered 2018-02-06 – 2018-02-08 (×4): 175 ug/h via INTRAVENOUS
  Filled 2018-02-03 (×8): qty 250

## 2018-02-03 MED ORDER — FENTANYL CITRATE (PF) 250 MCG/5ML IJ SOLN
INTRAMUSCULAR | Status: DC | PRN
  Administered 2018-02-03: 100 ug via INTRAVENOUS
  Administered 2018-02-03: 150 ug via INTRAVENOUS
  Administered 2018-02-03: 250 ug via INTRAVENOUS
  Administered 2018-02-03: 100 ug via INTRAVENOUS
  Administered 2018-02-03: 150 ug via INTRAVENOUS

## 2018-02-03 MED ORDER — AMIODARONE LOAD VIA INFUSION
INTRAVENOUS | Status: DC | PRN
  Administered 2018-02-03: 150 mg via INTRAVENOUS

## 2018-02-03 MED ORDER — SODIUM CHLORIDE 0.9 % IV SOLN
INTRAVENOUS | Status: DC
  Filled 2018-02-03 (×2): qty 1

## 2018-02-03 MED ORDER — BIVALIRUDIN BOLUS VIA INFUSION - CUPID
INTRAVENOUS | Status: DC | PRN
  Administered 2018-02-03: 39.75 mg via INTRAVENOUS

## 2018-02-03 MED ORDER — PANTOPRAZOLE SODIUM 40 MG PO TBEC: 40.0000 mg | DELAYED_RELEASE_TABLET | Freq: Every day | ORAL | Status: DC

## 2018-02-03 MED ORDER — CHLORHEXIDINE GLUCONATE 0.12% ORAL RINSE (MEDLINE KIT)
15.0000 mL | Freq: Two times a day (BID) | OROMUCOSAL | Status: DC
  Administered 2018-02-04 – 2018-02-05 (×3): 15 mL via OROMUCOSAL

## 2018-02-03 MED ORDER — DOCUSATE SODIUM 100 MG PO CAPS: 100.0000 mg | ORAL_CAPSULE | Freq: Two times a day (BID) | ORAL | Status: DC

## 2018-02-03 MED ORDER — PROTAMINE SULFATE 10 MG/ML IV SOLN
INTRAVENOUS | Status: DC | PRN
  Administered 2018-02-03: 100 mg via INTRAVENOUS
  Administered 2018-02-03: 10 mg via INTRAVENOUS
  Administered 2018-02-03: 80 mg via INTRAVENOUS

## 2018-02-03 MED ORDER — TRANEXAMIC ACID 1000 MG/10ML IV SOLN
1.5000 mg/kg/h | INTRAVENOUS | Status: DC
  Filled 2018-02-03: qty 25

## 2018-02-03 MED ORDER — SODIUM CHLORIDE 0.9 % IV SOLN
0.0000 ug/kg/h | INTRAVENOUS | Status: DC
  Administered 2018-02-03: 0.5 ug/kg/h via INTRAVENOUS
  Administered 2018-02-04: 0.7 ug/kg/h via INTRAVENOUS
  Filled 2018-02-03 (×3): qty 2

## 2018-02-03 MED ORDER — VASOPRESSIN 20 UNIT/ML IV SOLN
INTRAVENOUS | Status: AC
  Filled 2018-02-03: qty 1

## 2018-02-03 MED ORDER — FUROSEMIDE 10 MG/ML IJ SOLN
INTRAMUSCULAR | Status: AC
  Filled 2018-02-03: qty 8

## 2018-02-03 MED ORDER — NOREPINEPHRINE 4 MG/250ML-% IV SOLN
0.0000 ug/min | INTRAVENOUS | Status: DC
Start: 2018-02-03 — End: 2018-02-03
  Administered 2018-02-03: 3 ug/min via INTRAVENOUS
  Filled 2018-02-03: qty 250

## 2018-02-03 MED ORDER — PIPERACILLIN-TAZOBACTAM 3.375 G IVPB 30 MIN
3.3750 g | Freq: Once | INTRAVENOUS | Status: DC
  Filled 2018-02-03: qty 50

## 2018-02-03 MED ORDER — TRANEXAMIC ACID (OHS) PUMP PRIME SOLUTION
2.0000 mg/kg | INTRAVENOUS | Status: DC
  Filled 2018-02-03: qty 1.06

## 2018-02-03 MED ORDER — DIPHENHYDRAMINE HCL 50 MG/ML IJ SOLN
INTRAMUSCULAR | Status: AC
  Filled 2018-02-03: qty 1

## 2018-02-03 MED ORDER — SUCCINYLCHOLINE CHLORIDE 20 MG/ML IJ SOLN
INTRAMUSCULAR | Status: DC | PRN
  Administered 2018-02-03: 60 mg via INTRAVENOUS

## 2018-02-03 MED ORDER — METHYLPREDNISOLONE SODIUM SUCC 125 MG IJ SOLR
INTRAMUSCULAR | Status: DC | PRN
  Administered 2018-02-03: 125 mg via INTRAVENOUS

## 2018-02-03 MED ORDER — ASPIRIN 81 MG PO CHEW
324.0000 mg | CHEWABLE_TABLET | Freq: Every day | ORAL | Status: DC
  Administered 2018-02-05 – 2018-02-07 (×2): 324 mg
  Filled 2018-02-03 (×3): qty 4

## 2018-02-03 MED ORDER — SODIUM CHLORIDE 0.9 % IV BOLUS (SEPSIS)
1000.0000 mL | Freq: Once | INTRAVENOUS | Status: AC
  Administered 2018-02-03: 1000 mL via INTRAVENOUS

## 2018-02-03 MED ORDER — HEPARIN SODIUM (PORCINE) 5000 UNIT/ML IJ SOLN
3200.0000 [IU] | Freq: Once | INTRAMUSCULAR | Status: DC
  Filled 2018-02-03: qty 0.64

## 2018-02-03 MED ORDER — ACETAMINOPHEN 160 MG/5ML PO SOLN: 650.0000 mg | Freq: Once | ORAL | Status: DC

## 2018-02-03 MED ORDER — VANCOMYCIN HCL 1000 MG IV SOLR
INTRAVENOUS | Status: DC | PRN
  Administered 2018-02-03: 1000 mL

## 2018-02-03 MED ORDER — SODIUM CHLORIDE 0.9 % IV SOLN
250.0000 mL | INTRAVENOUS | Status: DC | PRN
Start: 2018-02-03 — End: 2018-02-08

## 2018-02-03 MED ORDER — MIDAZOLAM HCL 2 MG/2ML IJ SOLN
INTRAMUSCULAR | Status: DC | PRN
  Administered 2018-02-03 (×2): 1 mg via INTRAVENOUS

## 2018-02-03 MED ORDER — SODIUM CHLORIDE 0.9 % IV BOLUS (SEPSIS): 750.0000 mL | Freq: Once | INTRAVENOUS | Status: DC

## 2018-02-03 MED ORDER — TRANEXAMIC ACID (OHS) BOLUS VIA INFUSION
15.0000 mg/kg | INTRAVENOUS | Status: DC
  Filled 2018-02-03: qty 795

## 2018-02-03 MED ORDER — ROCURONIUM 10MG/ML (10ML) SYRINGE FOR MEDFUSION PUMP - OPTIME
INTRAVENOUS | Status: DC | PRN
  Administered 2018-02-03 (×4): 50 mg via INTRAVENOUS

## 2018-02-03 MED ORDER — VERAPAMIL HCL 2.5 MG/ML IV SOLN
INTRAVENOUS | Status: AC
  Filled 2018-02-03: qty 2

## 2018-02-03 MED ORDER — MILRINONE LACTATE IN DEXTROSE 20-5 MG/100ML-% IV SOLN
0.2500 ug/kg/min | INTRAVENOUS | Status: DC
  Administered 2018-02-04 – 2018-02-05 (×4): 0.5 ug/kg/min via INTRAVENOUS
  Administered 2018-02-06: 0.3 ug/kg/min via INTRAVENOUS
  Administered 2018-02-06: 0.5 ug/kg/min via INTRAVENOUS
  Administered 2018-02-07: 0.25 ug/kg/min via INTRAVENOUS
  Filled 2018-02-03 (×7): qty 100

## 2018-02-03 MED ORDER — CEFUROXIME SODIUM 1.5 G IV SOLR
INTRAVENOUS | Status: DC | PRN
  Administered 2018-02-03: .75 g via INTRAVENOUS
  Administered 2018-02-03: 1.5 g via INTRAVENOUS

## 2018-02-03 MED ORDER — INSULIN REGULAR BOLUS VIA INFUSION
0.0000 [IU] | Freq: Three times a day (TID) | INTRAVENOUS | Status: DC
  Filled 2018-02-03: qty 10

## 2018-02-03 MED ORDER — FENTANYL CITRATE (PF) 100 MCG/2ML IJ SOLN
INTRAMUSCULAR | Status: AC
  Filled 2018-02-03: qty 2

## 2018-02-03 MED ORDER — VERAPAMIL HCL 2.5 MG/ML IV SOLN
INTRAVENOUS | Status: DC | PRN
  Administered 2018-02-03: 10 mL via INTRA_ARTERIAL

## 2018-02-03 MED ORDER — SODIUM CHLORIDE 0.9% FLUSH: 3.0000 mL | INTRAVENOUS | Status: DC | PRN

## 2018-02-03 MED ORDER — MIDAZOLAM HCL 2 MG/2ML IJ SOLN
INTRAMUSCULAR | Status: AC
  Filled 2018-02-03: qty 2

## 2018-02-03 MED ORDER — BISACODYL 10 MG RE SUPP
10.0000 mg | Freq: Every day | RECTAL | Status: DC
  Administered 2018-02-07: 10 mg via RECTAL
  Filled 2018-02-03 (×2): qty 1

## 2018-02-03 SURGICAL SUPPLY — 117 items
ADAPTER CARDIO PERF ANTE/RETRO (ADAPTER) ×3 IMPLANT
ADH SKN CLS APL DERMABOND .7 (GAUZE/BANDAGES/DRESSINGS) ×3
ADPR PRFSN 84XANTGRD RTRGD (ADAPTER) ×3
BAG DECANTER FOR FLEXI CONT (MISCELLANEOUS) ×8 IMPLANT
BANDAGE ACE 4X5 VEL STRL LF (GAUZE/BANDAGES/DRESSINGS) ×4 IMPLANT
BANDAGE ACE 6X5 VEL STRL LF (GAUZE/BANDAGES/DRESSINGS) ×4 IMPLANT
BASKET HEART (ORDER IN 25'S) (MISCELLANEOUS) ×1
BASKET HEART (ORDER IN 25S) (MISCELLANEOUS) ×3 IMPLANT
BLADE CLIPPER SURG (BLADE) IMPLANT
BLADE STERNUM SYSTEM 6 (BLADE) ×4 IMPLANT
BLADE SURG 11 STRL SS (BLADE) ×1 IMPLANT
BNDG GAUZE ELAST 4 BULKY (GAUZE/BANDAGES/DRESSINGS) ×4 IMPLANT
CANISTER SUCT 3000ML PPV (MISCELLANEOUS) ×4 IMPLANT
CANNULA EZ GLIDE AORTIC 21FR (CANNULA) ×8 IMPLANT
CANNULA GUNDRY RCSP 15FR (MISCELLANEOUS) ×2 IMPLANT
CATH CPB KIT OWEN (MISCELLANEOUS) ×4 IMPLANT
CATH HEART VENT LEFT (CATHETERS) ×1 IMPLANT
CATH THORACIC 36FR (CATHETERS) ×4 IMPLANT
CLIP VESOCCLUDE MED 24/CT (CLIP) IMPLANT
CLIP VESOCCLUDE SM WIDE 24/CT (CLIP) ×3 IMPLANT
CONN ST 1/4X3/8  BEN (MISCELLANEOUS) ×2
CONN ST 1/4X3/8 BEN (MISCELLANEOUS) IMPLANT
CRADLE DONUT ADULT HEAD (MISCELLANEOUS) ×4 IMPLANT
DERMABOND ADVANCED (GAUZE/BANDAGES/DRESSINGS) ×1
DERMABOND ADVANCED .7 DNX12 (GAUZE/BANDAGES/DRESSINGS) IMPLANT
DRAIN CHANNEL 32F RND 10.7 FF (WOUND CARE) ×9 IMPLANT
DRAPE CARDIOVASCULAR INCISE (DRAPES) ×4
DRAPE INCISE IOBAN 66X45 STRL (DRAPES) ×4 IMPLANT
DRAPE SLUSH/WARMER DISC (DRAPES) ×4 IMPLANT
DRAPE SRG 135X102X78XABS (DRAPES) ×3 IMPLANT
DRSG AQUACEL AG ADV 3.5X14 (GAUZE/BANDAGES/DRESSINGS) ×2 IMPLANT
DRSG COVADERM 4X10 (GAUZE/BANDAGES/DRESSINGS) ×1 IMPLANT
DRSG COVADERM 4X14 (GAUZE/BANDAGES/DRESSINGS) ×4 IMPLANT
ELECT BLADE 4.0 EZ CLEAN MEGAD (MISCELLANEOUS) ×8
ELECT REM PT RETURN 9FT ADLT (ELECTROSURGICAL) ×8
ELECTRODE BLDE 4.0 EZ CLN MEGD (MISCELLANEOUS) ×4 IMPLANT
ELECTRODE REM PT RTRN 9FT ADLT (ELECTROSURGICAL) ×6 IMPLANT
FELT TEFLON 1X6 (MISCELLANEOUS) ×8 IMPLANT
GAUZE SPONGE 4X4 12PLY STRL (GAUZE/BANDAGES/DRESSINGS) ×8 IMPLANT
GAUZE SPONGE 4X4 12PLY STRL LF (GAUZE/BANDAGES/DRESSINGS) ×2 IMPLANT
GLOVE BIOGEL PI IND STRL 7.0 (GLOVE) ×3 IMPLANT
GLOVE BIOGEL PI INDICATOR 7.0 (GLOVE) ×3
GLOVE ORTHO TXT STRL SZ7.5 (GLOVE) ×8 IMPLANT
GLOVE SURG SS PI 6.0 STRL IVOR (GLOVE) ×6 IMPLANT
GLOVE SURG SS PI 6.5 STRL IVOR (GLOVE) ×12 IMPLANT
GOWN STRL REUS W/ TWL LRG LVL3 (GOWN DISPOSABLE) ×16 IMPLANT
GOWN STRL REUS W/TWL LRG LVL3 (GOWN DISPOSABLE) ×32
GRAFT HEMASHIELD 10MM (Graft) ×4 IMPLANT
GRAFT VASC STRG 30X10STRL (Graft) IMPLANT
HEMOSTAT POWDER SURGIFOAM 1G (HEMOSTASIS) ×12 IMPLANT
INSERT FOGARTY XLG (MISCELLANEOUS) ×4 IMPLANT
KIT BASIN OR (CUSTOM PROCEDURE TRAY) ×4 IMPLANT
KIT ROOM TURNOVER OR (KITS) ×4 IMPLANT
KIT SUCTION CATH 14FR (SUCTIONS) ×14 IMPLANT
KIT VASOVIEW HEMOPRO VH 3000 (KITS) ×4 IMPLANT
LEAD PACING MYOCARDI (MISCELLANEOUS) ×4 IMPLANT
LINE VENT (MISCELLANEOUS) ×2 IMPLANT
MARKER GRAFT CORONARY BYPASS (MISCELLANEOUS) ×12 IMPLANT
NS IRRIG 1000ML POUR BTL (IV SOLUTION) ×20 IMPLANT
PACK E OPEN HEART (SUTURE) ×4 IMPLANT
PACK OPEN HEART (CUSTOM PROCEDURE TRAY) ×4 IMPLANT
PAD ARMBOARD 7.5X6 YLW CONV (MISCELLANEOUS) ×8 IMPLANT
PAD ELECT DEFIB RADIOL ZOLL (MISCELLANEOUS) ×4 IMPLANT
PENCIL BUTTON HOLSTER BLD 10FT (ELECTRODE) ×4 IMPLANT
PUMP SET IMPELLA LD (CATHETERS) ×2 IMPLANT
PUNCH AORTIC ROTATE  4.5MM 8IN (MISCELLANEOUS) ×2 IMPLANT
PUNCH AORTIC ROTATE 4.0MM (MISCELLANEOUS) IMPLANT
PUNCH AORTIC ROTATE 4.5MM 8IN (MISCELLANEOUS) IMPLANT
PUNCH AORTIC ROTATE 5MM 8IN (MISCELLANEOUS) IMPLANT
SEALANT SURG COSEAL 8ML (VASCULAR PRODUCTS) ×2 IMPLANT
SET CARDIOPLEGIA MPS 5001102 (MISCELLANEOUS) ×3 IMPLANT
SOLUTION ANTI FOG 6CC (MISCELLANEOUS) ×2 IMPLANT
SPONGE LAP 18X18 X RAY DECT (DISPOSABLE) ×2 IMPLANT
SPONGE LAP 4X18 X RAY DECT (DISPOSABLE) ×2 IMPLANT
SUT BONE WAX W31G (SUTURE) ×4 IMPLANT
SUT ETHIBOND X763 2 0 SH 1 (SUTURE) ×10 IMPLANT
SUT MNCRL AB 3-0 PS2 18 (SUTURE) ×8 IMPLANT
SUT MNCRL AB 4-0 PS2 18 (SUTURE) ×1 IMPLANT
SUT PDS AB 1 CTX 36 (SUTURE) ×9 IMPLANT
SUT PROLENE 2 0 SH DA (SUTURE) IMPLANT
SUT PROLENE 3 0 SH DA (SUTURE) ×4 IMPLANT
SUT PROLENE 3 0 SH1 36 (SUTURE) IMPLANT
SUT PROLENE 4 0 RB 1 (SUTURE) ×8
SUT PROLENE 4 0 SH DA (SUTURE) ×2 IMPLANT
SUT PROLENE 4-0 RB1 .5 CRCL 36 (SUTURE) ×2 IMPLANT
SUT PROLENE 5 0 C 1 36 (SUTURE) ×12 IMPLANT
SUT PROLENE 6 0 C 1 30 (SUTURE) ×6 IMPLANT
SUT PROLENE 7.0 RB 3 (SUTURE) ×19 IMPLANT
SUT PROLENE 8 0 BV175 6 (SUTURE) IMPLANT
SUT PROLENE BLUE 7 0 (SUTURE) ×7 IMPLANT
SUT PROLENE POLY MONO (SUTURE) IMPLANT
SUT SILK  1 MH (SUTURE) ×4
SUT SILK 1 MH (SUTURE) ×3 IMPLANT
SUT STEEL 6MS V (SUTURE) IMPLANT
SUT STEEL STERNAL CCS#1 18IN (SUTURE) ×2 IMPLANT
SUT STEEL SZ 6 DBL 3X14 BALL (SUTURE) ×2 IMPLANT
SUT VIC AB 1 CTX 36 (SUTURE)
SUT VIC AB 1 CTX36XBRD ANBCTR (SUTURE) IMPLANT
SUT VIC AB 2-0 CT1 27 (SUTURE) ×4
SUT VIC AB 2-0 CT1 TAPERPNT 27 (SUTURE) ×1 IMPLANT
SUT VIC AB 2-0 CTX 27 (SUTURE) IMPLANT
SUT VIC AB 3-0 SH 27 (SUTURE)
SUT VIC AB 3-0 SH 27X BRD (SUTURE) IMPLANT
SUT VIC AB 3-0 SH 8-18 (SUTURE) ×3 IMPLANT
SUT VIC AB 3-0 X1 27 (SUTURE) IMPLANT
SUT VICRYL 4-0 PS2 18IN ABS (SUTURE) IMPLANT
SYSTEM SAHARA CHEST DRAIN ATS (WOUND CARE) ×4 IMPLANT
TAPE CLOTH SURG 4X10 WHT LF (GAUZE/BANDAGES/DRESSINGS) ×2 IMPLANT
TOWEL GREEN STERILE (TOWEL DISPOSABLE) ×4 IMPLANT
TOWEL GREEN STERILE FF (TOWEL DISPOSABLE) ×4 IMPLANT
TRAY FOLEY SILVER 16FR TEMP (SET/KITS/TRAYS/PACK) ×4 IMPLANT
TUBE SUCTION PSI-TEC 12FT (MISCELLANEOUS) ×2 IMPLANT
TUBING INSUFFLATION (TUBING) ×4 IMPLANT
UNDERPAD 30X30 (UNDERPADS AND DIAPERS) ×4 IMPLANT
VENT LEFT HEART 12002 (CATHETERS) ×4
WATER STERILE IRR 1000ML POUR (IV SOLUTION) ×8 IMPLANT
YANKAUER SUCT BULB TIP NO VENT (SUCTIONS) ×3 IMPLANT

## 2018-02-03 SURGICAL SUPPLY — 26 items
BALLN IABP SENSA PLUS 7.5F 40C (BALLOONS) ×2
BALLN SAPPHIRE 2.5X12 (BALLOONS) ×2
BALLOON IABP SENS PLUS 7.5F40C (BALLOONS) IMPLANT
BALLOON SAPPHIRE 2.5X12 (BALLOONS) IMPLANT
CATH INFINITI JR4 5F (CATHETERS) ×1 IMPLANT
CATH SWAN GANZ 7F STRAIGHT (CATHETERS) ×1 IMPLANT
CATH VISTA GUIDE 6FR XBLAD3.5 (CATHETERS) ×1 IMPLANT
COVER PRB 48X5XTLSCP FOLD TPE (BAG) IMPLANT
COVER PROBE 5X48 (BAG) ×6
DEVICE RAD COMP TR BAND LRG (VASCULAR PRODUCTS) ×1 IMPLANT
GLIDESHEATH SLEND SS 6F .021 (SHEATH) ×1 IMPLANT
GUIDEWIRE INQWIRE 1.5J.035X260 (WIRE) IMPLANT
INQWIRE 1.5J .035X260CM (WIRE) ×2
KIT ENCORE 26 ADVANTAGE (KITS) ×2 IMPLANT
KIT HEART LEFT (KITS) ×2 IMPLANT
PACK CARDIAC CATHETERIZATION (CUSTOM PROCEDURE TRAY) ×2 IMPLANT
SHEATH PINNACLE 6F 10CM (SHEATH) ×3 IMPLANT
SHEATH PINNACLE 7F 10CM (SHEATH) ×1 IMPLANT
SLEEVE REPOSITIONING LENGTH 30 (MISCELLANEOUS) ×1 IMPLANT
TRANSDUCER W/STOPCOCK (MISCELLANEOUS) ×3 IMPLANT
TUBING CIL FLEX 10 FLL-RA (TUBING) ×2 IMPLANT
WIRE ASAHI PROWATER 180CM (WIRE) ×1 IMPLANT
WIRE EMERALD 3MM-J .025X260CM (WIRE) ×1 IMPLANT
WIRE EMERALD 3MM-J .035X150CM (WIRE) ×1 IMPLANT
WIRE HI TORQ VERSACORE-J 145CM (WIRE) ×1 IMPLANT
WIRE PT2 MS 185 (WIRE) ×1 IMPLANT

## 2018-02-03 NOTE — Progress Notes (Signed)
Per CRNA- leave pt RR set at 20, and 100% fio2 for now (per ABG in OR).

## 2018-02-03 NOTE — Progress Notes (Signed)
Recruitment maneuver preformed on pt nat 1930. Maneuver was completed for 2 minutes and pt remained hemodynamically stable t/o the procedure.

## 2018-02-03 NOTE — ED Triage Notes (Addendum)
Patient here from home with complaints of SOB, nausea, vomiting x2 days. Hx of lung cancer-remission.actively vomiting in triage.

## 2018-02-03 NOTE — Anesthesia Procedure Notes (Addendum)
Central Venous Catheter Insertion Performed by: Murvin Natal, MD, anesthesiologist Start/End2/24/2019 12:40 AM, 02/03/2018 12:50 AM Patient location: Pre-op. Emergency situation Preanesthetic checklist: patient identified, IV checked, site marked, risks and benefits discussed, surgical consent, monitors and equipment checked, pre-op evaluation, timeout performed and anesthesia consent Position: Trendelenburg Hand hygiene performed  and maximum sterile barriers used  Catheter size: 8.5 Fr Total catheter length 10. PA cath was placed.Sheath introducer Swan type:thermodilution PA Cath depth:45 Procedure performed using ultrasound guided technique. Attempts: 1 Following insertion, line sutured, dressing applied and Biopatch. Post procedure assessment: blood return through all ports, free fluid flow and no air  Patient tolerated the procedure well with no immediate complications.

## 2018-02-03 NOTE — Progress Notes (Signed)
ANTICOAGULATION CONSULT NOTE - Initial Consult  Pharmacy Consult for heparin Indication: chest pain/ACS  Allergies  Allergen Reactions  . Codeine Nausea And Vomiting  . Latex Hives and Other (See Comments)    Skin irritation  . Pineapple Hives  . Iodinated Diagnostic Agents Hives and Rash  . Sulfa Antibiotics Hives and Rash    Patient Measurements:   Heparin Dosing Weight: 52.7 kg (per 12/19/2017 encounter)  Vital Signs: Temp: 97.3 F (36.3 C) (02/24 0830) BP: 106/92 (02/24 0830) Pulse Rate: 132 (02/24 0830)  Labs: Recent Labs    02/03/18 0853  HGB 18.1*  HCT 53.9*  PLT 204  CREATININE 1.17*    CrCl cannot be calculated (Unknown ideal weight.).   Medical History: Past Medical History:  Diagnosis Date  . Allergy   . Anxiety   . Asthma   . Hypertension   . PAF (paroxysmal atrial fibrillation) ([place])   . Prophylactic measure 08/03/14-08/19/14   Prophyl. cranial radiation 24 Gy  . Small cell lung cancer ([place]) 03/16/2014     Assessment: 57 yoF presents with elevated troponin. Begin heparin and [facility] to [facility].  Hgb high, platelets WNL.  Goal of Therapy:  Heparin level 0.3-0.7 units/ml Monitor platelets by anticoagulation protocol: Yes   Plan:  Heparin 3200 unit IV bolus then start infusion at 650 units/hr. Check heparin level in 6 hours. Daily heparin level and CBC while on heparin infusion.   Hershal Coria 02/03/2018,9:59 AM

## 2018-02-03 NOTE — Op Note (Addendum)
CARDIOTHORACIC SURGERY OPERATIVE NOTE  Date of Procedure: 02/03/2018  Preoperative Diagnosis:   Severe Multi-vessel Coronary Artery Disease  Acute ST-segment Elevation Myocardial Infarction  Cardiogenic Shock  Postoperative Diagnosis: Same  Procedure:    Emergency Coronary Artery Bypass Grafting x 3   Left Internal Mammary Artery to Distal Left Anterior Descending Coronary Artery  Saphenous Vein Graft to Obtuse Marginal Branch of Left Circumflex Coronary Artery  Sapheonous Vein Graft to Diagonal Branch Coronary Artery  Endoscopic Vein Harvest from Right Thigh   Implantation of Impella LD Left Ventricular Assist Device  10 mm Hemashield graft with direct aortic approach  Surgeon: Valentina Gu. Roxy Manns, MD  Assistant: Ellwood Handler, PA-C  Anesthesia: Adele Barthel, MD  Operative Findings:  Pre-operative cardiogenic shock  Profound left ventricular systolic dysfunction  Moderate aortic insufficiency  Good quality left internal mammary artery conduit  Good quality saphenous vein conduit  Good quality target vessels for grafting     BRIEF CLINICAL NOTE AND INDICATIONS FOR SURGERY  Patient is a 57 year old female with no previous history of coronary artery disease but history of paroxysmal atrial fibrillation, hypertension, small cell lung cancer treated with definitive chemotherapy and mediastinal radiation therapy followed by prophylactic radiation therapy to the brain who presented to the emergency department at [facility] early today with several day history of worsening chest tightness and shortness of breath.  In the emergency department she was noted to be tachycardic with new left bundle branch block on EKG.  Troponin level was severely elevated and greater than 30.  Code STEMI was called and the patient was taken directly to the cardiac Cath Lab at [facility] by Dr. [place] where she was found to have severe coronary artery disease  with likely acute occlusion of the ostial left anterior descending coronary artery.  With attempts to pass a wire for percutaneous coronary intervention the patient suddenly developed acute occlusion of the left circumflex coronary artery which was complicated by the development of acute cardiogenic shock with hypotension.  Balloon angioplasty of left circumflex coronary artery was accomplished successfully to reestablish flow in the left circumflex distribution.  Emergency cardiothoracic surgical consultation was requested.  The patient was emergently intubated by the anesthesia team because of acute hypoxemic respiratory failure.  An intra-aortic balloon pump was placed.  The patient was taken directly from the cath lab to the operating room for emergency surgical revascularization.   DETAILS OF THE OPERATIVE PROCEDURE  Preparation:  The patient is brought to the operating room on the above mentioned date and central monitoring was established by the anesthesia team.  The existing Swan-Ganz catheter placed in the Cath Lab via the femoral approach was removed.  A new Swan-Ganz catheter is placed by the anesthesia team via right internal jugular approach.  There was severe pulmonary hypertension with ongoing cardiogenic shock.  The preexisting radial arterial sheath and IABP were monitored. The patient is placed in the supine position on the operating table.  Intravenous antibiotics are administered. General endotracheal anesthesia is induced uneventfully. A Foley catheter is placed.  Baseline transesophageal echocardiogram was performed.  Findings were notable for profound left ventricular systolic dysfunction.  The left ventricle was dilated.  There was very little wall motion with ejection fraction calculated 5%.  There was moderate central aortic insufficiency.  The aortic valve was trileaflet with mild thickening of the leaflets, suggestive of possible rheumatic disease.  There was mild mitral  regurgitation.  Right ventricular function appeared reasonably normal.  The  patient's chest, abdomen, both groins, and both lower extremities are prepared and draped in a sterile manner. A time out procedure is performed.   Surgical Approach and Conduit Harvest:  A median sternotomy incision was performed and the left internal mammary artery is dissected from the chest wall and prepared for bypass grafting. The left internal mammary artery is notably small in caliber but otherwise good quality conduit. Simultaneously, the greater saphenous vein is obtained from the patient's right thigh using endoscopic vein harvest technique. The saphenous vein is notably good quality conduit. After removal of the saphenous vein, the small surgical incisions in the lower extremity are closed with absorbable suture. Following systemic heparinization, the left internal mammary artery was transected distally noted to have excellent flow.   Extracorporeal Cardiopulmonary Bypass and Myocardial Protection:  The pericardium is opened. The ascending aorta is normal in appearance. The ascending aorta and the right atrium are cannulated for cardiopulmonary bypass.  Adequate heparinization is verified.      Cardiopulmonary bypass was begun and a retrograde cardioplegia cannula is placed through the right atrium into the coronary sinus.  A left ventricular vent is placed through the right superior pulmonary vein.  The surface of the heart is inspected. Distal target vessels are selected for coronary artery bypass grafting. A cardioplegia cannula is placed in the ascending aorta.  A temperature probe was placed in the interventricular septum.  The patient is allowed to cool passively to [facility] systemic temperature.  The aortic cross clamp is applied and cold blood cardioplegia is delivered initially in an antegrade fashion through the aortic root.   Supplemental cardioplegia is given retrograde through the coronary sinus catheter.   Iced saline slush is applied for topical hypothermia.  The initial cardioplegic arrest is rapid with early diastolic arrest.  Repeat doses of cardioplegia are administered intermittently throughout the entire cross clamp portion of the operation through the aortic root, through the coronary sinus catheter, and through subsequently placed vein grafts in order to maintain completely flat electrocardiogram and septal myocardial temperature below 15C.  Myocardial protection was felt to be excellent.  Coronary Artery Bypass Grafting:   The obtuse marginal branch of the left circumflex coronary artery was grafted using a reversed saphenous vein graft in an end-to-side fashion.  At the site of distal anastomosis the target vessel was good quality and measured approximately 2.0 mm in diameter.  The diagonal branch of the left anterior descending coronary artery was grafted using a reversed saphenous vein graft in an end-to-side fashion.  At the site of distal anastomosis the target vessel was good quality and measured approximately 1.8 mm in diameter.  The distal left anterior coronary artery was grafted with the left internal mammary artery in an end-to-side fashion.  At the site of distal anastomosis the target vessel was good quality and measured approximately 2.0 mm in diameter.  All proximal vein graft anastomoses were placed directly to the ascending aorta prior to removal of the aortic cross clamp.  The septal myocardial temperature rose rapidly after reperfusion of the left internal mammary artery graft.  The aortic cross clamp was removed after a total cross clamp time of 58 minutes.  All proximal and distal coronary anastomoses were inspected for hemostasis and appropriate graft orientation. Epicardial pacing wires are fixed to the right ventricular outflow tract and to the right atrial appendage. The patient is rewarmed to 37C temperature.  Milrinone and low-dose epinephrine infusions were begun.   Intra-aortic balloon pump is restarted.  The  patient was allowed to rest with the left ventricular vent on for in excess of 30 minutes..  The left ventricular vent was turned off and an attempt was made to wean the patient from cardiopulmonary bypass.  The patient is noted to have profound left ventricular dysfunction with tendency of the left ventricle to dilate due to severe systolic dysfunction and moderate (2+) aortic insufficiency.  A decision is made to proceed with placement of left ventricular assist device.   Placement of Impella LD Left Ventricular Assist Device:  The left ventricular vent is turned back on.  The retrograde catheter is replaced through the right atrium into the coronary sinus.  The aortic cross-clamp is replaced as high as possible just beneath the takeoff of the innominate artery.  A bulldog clamp was placed on the left internal mammary artery pedicle.  Cold blood cardioplegia was administered retrograde to the coronary sinus catheter.  Iced saline slush is applied for topical hypothermia.  The initial cardioplegic arrest is rapid with early diastolic arrest.   An 11 blade knife is utilized to make an incision on the anterolateral wall of the distal ascending thoracic aorta.  A coronary punch is utilized to fashion an oval-shaped hole in the aorta.  A 10 mm Hemashield platinum woven double velour vascular graft is trimmed to create a beveled end and sewn directly onto the distal ascending thoracic aorta in end-to-side fashion using running 5-0 Prolene suture.  The left internal mammary artery graft was reperfused and another dose of warm retrograde "hot shot" cardioplegia was administered.  The left ventricular vent is turned off and the heart allowed to fill to evacuate any residual air through the aortic root.  The distal end of the Hemashield graft is tunneled through the soft tissues at the level of the sternal notch and exited through a separate stab incision in the right  lateral neck.  A clamp was placed on the distal end of the initial graft and the aortic cross-clamp removed.  The aortic cross-clamp is removed after a second cross-clamp time duration of 23 minutes.    An Abiomed Impella LD left ventricular assist device (ref [id number], serial [id number]) is prepared for implantation per manufacturer protocol.  The pump is passed through the Hemashield graft and the 2 plugs surrounding the pump positioned within the graft to minimize blood loss.  The pump was then advanced through the ascending aorta and through the aortic valve into the left ventricle under TEE guidance.  Ventilation is begun and the heart allowed to fill.  The left ventricular vent is removed.  The Impella left ventricular assist device is turned on and pump flows slowly increased.   Procedure Completion:  The patient is weaned and disconnected from cardiopulmonary bypass.  As cardiopulmonary bypass flow is decreased the Impella left ventricular assist device flows are increased.  The patient's rhythm at separation from bypass was sinus.  The patient was weaned from cardiopulmonary bypass on milrinone 0.5 mcg/kg/min and low dose epinephrine for inotropic support. Total cardiopulmonary bypass time for the operation was 179 minutes.  The Impella left ventricular assist device flows remained consistently between 4.5 and 4.8 L/min.  Appropriate position of the assist device catheter is verified intermittently using TEE during the remainder of the procedure.  Right ventricular function appears fairly good.  Hemodynamics remain remarkably stable.  The aortic and venous cannula were removed uneventfully. Protamine was administered to reverse the anticoagulation. The mediastinum and pleural spaces were inspected for hemostasis and irrigated with  saline solution. The mediastinum and both pleural space were drained using 4 chest tubes placed through separate stab incisions inferiorly.  The soft tissues anterior to the  aorta were reapproximated loosely. The sternum is closed with double strength sternal wire. The soft tissues anterior to the sternum were closed in multiple layers and the skin is closed with a running subcuticular skin closure.  The post-bypass portion of the operation was notable for stable rhythm and hemodynamics.  The patient received a total of 2 packs adult platelets due to coagulopathy and thrombocytopenia after separation from cardiopulmonary bypass and reversal of heparin with protamine. No other blood products were administered during the operation.   Disposition:  The patient tolerated the procedure well and is transported to the surgical intensive care in critical but stable condition. There are no intraoperative complications. All sponge instrument and needle counts are verified correct at completion of the operation.    Valentina Gu. Roxy Manns MD 02/03/2018 6:18 PM

## 2018-02-03 NOTE — Consult Note (Addendum)
[address]       [place], [id number]             [phone]          CARDIOTHORACIC SURGERY CONSULTATION REPORT  PCP is System, Pcp Not In Referring Provider is Peter Martinique, MD   Reason for consultation:  Acute STEMI, cardiogenic shock  HPI:  Patient is a 57 year old female with no previous history of coronary artery disease but history of paroxysmal atrial fibrillation, hypertension, small cell lung cancer treated with definitive chemotherapy and mediastinal radiation therapy followed by prophylactic radiation therapy to the brain who presented to the emergency department at [facility] early today with several day history of worsening chest tightness and shortness of breath.  In the emergency department she was noted to be tachycardic with new left bundle branch block on EKG.  Troponin level was severely elevated and greater than 30.  Code STEMI was called and the patient was taken directly to the cardiac Cath Lab at [facility] by Dr. Martinique where she was found to have severe coronary artery disease with likely acute occlusion of the ostial left anterior descending coronary artery.  With attempts to pass a wire for percutaneous coronary intervention the patient suddenly developed acute occlusion of the left circumflex coronary artery which was complicated by the development of acute cardiogenic shock with hypotension.  Balloon angioplasty of left circumflex coronary artery was accomplished successfully to reestablish flow in the left circumflex distribution.  Emergency cardiothoracic surgical consultation was requested.  The patient was emergently intubated by the anesthesia team because of acute hypoxemic respiratory failure.  An intra-aortic balloon pump was placed.  Upon my arrival the patient was intubated and sedated on the Cath Lab table with intra-aortic balloon pump in place.  She remained hypotensive and tachycardic with  severely elevated left ventricular end-diastolic pressure.   Past Medical History:  Diagnosis Date  . Allergy   . Anxiety   . Asthma   . Hypertension   . PAF (paroxysmal atrial fibrillation) ([place])   . Prophylactic measure 08/03/14-08/19/14   Prophyl. cranial radiation 24 Gy  . Small cell lung cancer ([place]) 03/16/2014    Past Surgical History:  Procedure Laterality Date  . CESAREAN SECTION    . MEDIASTINOSCOPY N/A 03/11/2014   Procedure: MEDIASTINOSCOPY;  Surgeon: Melrose Nakayama, MD;  Location: [place];  Service: Thoracic;  Laterality: N/A;  . TUBAL LIGATION    . VIDEO BRONCHOSCOPY WITH ENDOBRONCHIAL ULTRASOUND N/A 03/11/2014   Procedure: VIDEO BRONCHOSCOPY WITH ENDOBRONCHIAL ULTRASOUND;  Surgeon: Melrose Nakayama, MD;  Location: [facility] OR;  Service: Thoracic;  Laterality: N/A;    Family History  Problem Relation Age of Onset  . Heart disease Mother   . Hypertension Mother   . Heart attack Mother   . Hypertension Maternal Grandmother   . Cancer Maternal Grandmother   . Diabetes Paternal Grandmother   . Stroke Neg Hx     Social History   Socioeconomic History  . Marital status: Married    Spouse name: Not on file  . Number of children: Not on file  . Years of education: Not on file  . Highest education level: Not on file  Social Needs  . Financial resource strain: Not on file  . Food insecurity - worry: Not on file  . Food insecurity - inability: Not on file  . Transportation needs - medical: Not on file  . Transportation needs -  non-medical: Not on file  Occupational History  . Not on file  Tobacco Use  . Smoking status: Former Smoker    Packs/day: 1.00    Years: 20.00    Pack years: 20.00    Types: Cigarettes    Last attempt to quit: 03/13/2014    Years since quitting: 3.8  . Smokeless tobacco: Never Used  Substance and Sexual Activity  . Alcohol use: Yes    Alcohol/week: 4.8 oz    Types: 8 Glasses of wine per week  . Drug use: No  . Sexual activity: No    Other Topics Concern  . Not on file  Social History Narrative  . Not on file    Prior to Admission medications   Medication Sig Start Date End Date Taking? Authorizing Provider  albuterol (PROVENTIL HFA) 108 (90 BASE) MCG/ACT inhaler Inhale 2 puffs into the lungs every 4 (four) hours as needed for wheezing or shortness of breath. 02/24/14   Roselee Culver, MD  albuterol (PROVENTIL) (2.5 MG/3ML) 0.083% nebulizer solution Take 2.5 mg by nebulization every 6 (six) hours as needed for wheezing or shortness of breath (wheezing and shortness of breath).    [provider]  cetirizine (ZYRTEC) 10 MG tablet Take 10 mg daily as needed by mouth for allergies.    [provider]  fluticasone (FLONASE) 50 MCG/ACT nasal spray Place 2 sprays daily as needed into both nostrils for allergies.    [provider]  lisinopril (PRINIVIL,ZESTRIL) 10 MG tablet Take 10 mg by mouth daily. 11/27/17   [provider]    Current Facility-Administered Medications  Medication Dose Route Frequency Provider Last Rate Last Dose  . bivalirudin (ANGIOMAX) 250 mg in sodium chloride 0.9 % 50 mL (5 mg/mL) infusion    Continuous PRN Martinique, Peter M, MD   Stopped at 02/03/18 1147  . bivalirudin (ANGIOMAX) BOLUS via infusion    PRN Martinique, Peter M, MD   39.75 mg at 02/03/18 1100  . diphenhydrAMINE (BENADRYL) injection    PRN Martinique, Peter M, MD   25 mg at 02/03/18 1040  . fentaNYL (SUBLIMAZE) 2,500 mcg in sodium chloride 0.9 % 250 mL (10 mcg/mL) infusion  0-400 mcg/hr Intravenous Continuous Bensimhon, Shaune Pascal, MD      . fentaNYL (SUBLIMAZE) injection    PRN Martinique, Peter M, MD   50 mcg at 02/03/18 1144  . heparin ADULT infusion 100 units/mL (25000 units/24mL sodium chloride 0.45%)  650 Units/hr Intravenous Continuous Dara Hoyer, RPH      . [MAR Hold] heparin injection 3,200 Units  3,200 Units Intravenous Once Mackuen, Courteney Lyn, MD      . lidocaine (PF) (XYLOCAINE) 1 % injection     PRN Martinique, Peter M, MD   10 mL at 02/03/18 1112  . methylPREDNISolone sodium succinate (SOLU-MEDROL) 125 mg/2 mL injection    PRN Martinique, Peter M, MD   125 mg at 02/03/18 1040  . midazolam (VERSED) 50 mg in sodium chloride 0.9 % 50 mL (1 mg/mL) infusion  0.5 mg/hr Intravenous Titrated Bensimhon, Shaune Pascal, MD      . midazolam (VERSED) injection    PRN Martinique, Peter M, MD   1 mg at 02/03/18 1144  . milrinone (PRIMACOR) 20 MG/100 ML (0.2 mg/mL) infusion  0.25 mcg/kg/min Intravenous Continuous Bensimhon, Shaune Pascal, MD      . norepinephrine (LEVOPHED) 4 mg in dextrose 5 % 250 mL (0.016 mg/mL) infusion    Continuous PRN Martinique, Peter M, MD  56.3 mL/hr at 02/03/18 1135 15 mcg/min at 02/03/18 1135  . [MAR Hold] piperacillin-tazobactam (ZOSYN) IVPB 3.375 g  3.375 g Intravenous Once Mackuen, Courteney Lyn, MD      . Radial Cocktail/Verapamil only    PRN Martinique, Peter M, MD   10 mL at 02/03/18 1041  . [MAR Hold] sodium chloride 0.9 % bolus 750 mL  750 mL Intravenous Once Mackuen, Courteney Lyn, MD      . Doug Sou Hold] vancomycin (VANCOCIN) IVPB 1000 mg/200 mL premix  1,000 mg Intravenous Once Mackuen, Courteney Lyn, MD       Facility-Administered Medications Ordered in Other Encounters  Medication Dose Route Frequency Provider Last Rate Last Dose  . etomidate (AMIDATE) injection    Anesthesia Intra-op [name], CRNA   12 mg at 02/03/18 1130  . succinylcholine (ANECTINE) injection    Anesthesia Intra-op [name], CRNA   60 mg at 02/03/18 1130    Allergies  Allergen Reactions  . Codeine Nausea And Vomiting  . Latex Hives and Other (See Comments)    Skin irritation  . Pineapple Hives  . Iodinated Diagnostic Agents Hives and Rash  . Sulfa Antibiotics Hives and Rash      Review of Systems:  Unable to obtain.       Physical Exam:   BP (!) 106/92 (BP Location: Right Arm)   Pulse (!) 132   Temp (!) 97.3 F (36.3 C)   Resp 20   Wt 116 lb 13.5 oz (53 kg)   SpO2 99%   BMI  22.82 kg/m    General:  Intubated, sedated on ventilator in cardiogenic shock  HEENT:  Unremarkable   Neck:   no JVD, no bruits, no adenopathy   Chest:   Coarse breath sounds, symmetrical   CV:   tachycardic  Abdomen:  soft, non-distended  Extremities:  Cool but adequately perfused  Rectal/GU  Deferred  Neuro:   Deferred  Skin:   Deferred  Diagnostic Tests:  Coronary/Graft Acute MI Revascularization  CORONARY BALLOON ANGIOPLASTY  IABP Insertion  LEFT HEART CATH AND CORONARY ANGIOGRAPHY  RIGHT HEART CATH  Conclusion     Prox RCA to Mid RCA lesion is 25% stenosed.  Ost Ramus lesion is 70% stenosed.  Ost Cx to Prox Cx lesion is 50% stenosed.  Ost LAD to Prox LAD lesion is 100% stenosed.  LV end diastolic pressure is severely elevated.  Hemodynamic findings consistent with severe pulmonary hypertension.  Post intervention, there is a 100% residual stenosis.   1. 2 vessel obstructive CAD. There was ostial LAD occlusion. With coronary guidewire probing there was shift of plaque into the LCx and ramus intermediate with acute vessel compromise and resultant worsening cardiogenic shock. The patency of LCx and ramus intermediate vessels were maintained with PTCA and wire placement. 2. Cardiogenic shock with marked elevation of LV filling pressures and right heart pressures with pulmonary venous HTN. Please refer to note below. 3. S/p IABP placement.   Plan: transfer to OR as noted for emergent CABG. Continue pressor support and IABP. Continue ventilator support.    Indications   ST elevation myocardial infarction involving left anterior descending (LAD) coronary artery (HCC) [I21.02 (ICD-10-CM)]  Procedural Details/Technique   Technical Details Indication: 57 yo BF with history of small cell lung CA and remote history of afib presents with several day history of chest pain, epigastric pain, and dyspnea. On arrival at [facility] she was tachycardic with HR 140. She had a new LBBB.  Troponin >  30. BNP > 3000. Urgent cardiac cath recommended.      Procedure: Left Heart Cath, Selective Coronary Angiography, Right heart cath, PTCA of the ostial LCx, IABP placement   Diagnostic Procedure Details: We initially accessed the right radial artery using a modified Seldinger technique and US guidance. 5 Fr slender sheath placed without difficulty but unable to access the heart due to very small segment of the radial artery. The right groin was prepped, draped, and anesthetized with 1% lidocaine. Using the modified Seldinger technique and [place] guiance, a 6 French sheath was introduced into the right femoral artery. Standard Judkins catheters were used for selective coronary angiography and left ventricular pressures. Catheter exchanges were performed over a wire. The diagnostic procedure was well-tolerated without immediate complications.  PCI Procedure Note: Following the diagnostic procedure, the decision was made to proceed with PCI of the LAD. Weight-based bivalirudin was given for anticoagulation. Once a therapeutic ACT was achieved, a 6 [place] XBLAD 3.5 guide catheter was inserted. A prowater coronary guidewire was used. With initial probing of the LAD ostium with the wire there was immediate plaque shift into the LCx and ostial ramus intermediate arteries. The patient acutely decompensated with hypotension and worsening respiratory status. The wire was redirected into the LCx and the origin of the LCx was dilated with a 2.5 mm balloon with good restoration of flow. Another PT wire was placed into the ramus intermediate to maintain flow. The patient was started on IV Levophed. With her shock she was sedated and intubated per anesthesia. Right femoral vein access was obtained using US guidance and Seldinger technique with a 7 Fr. Sheath. The left femoral artery was also accessed in a similar fashion with a 6 Fr sheath. Right heart catheterization was performed using a Swan Ganz catheter with  measurements of right heart pressures and cardiac output. Angiography of the left femoral artery demonstrated some areas of PAD. With this and the vessel size we did not feel her access would allow placement of an Impella device. We did place a 40 cc IABP via the left femoral access. She was also started on IV milrinone and lasix. Angiography demonstrated continued patency of the LCx and ramus intermediate but the LAD remained occluded. Consultation was obtained with Dr Roxy Manns with CT surgery. Given ongoing shock and essentially left main disease compromising the LCA circulation it was felt that emergent CT surgery was her best option for survival. She was NOT given P2Y12 therapy. Sheaths and IABP were sutured in place. The patient was transferred to the OR for emergent CABG.  Contrast: 90 cc   Estimated blood loss <50 mL.  During this procedure the patient was administered the following to achieve and maintain moderate conscious sedation: Versed 2 mg, Fentanyl 75 mcg, while the patient's heart rate, blood pressure, and oxygen saturation were continuously monitored. The period of conscious sedation was 22 minutes, of which I was present face-to-face 100% of this time.  Complications     Log Level Complications   Cardiogenic shock Documented by Martinique, Peter M, MD 02/03/2018 12:07 PM EST  Time Range: Intra-procedure    Coronary Findings   Diagnostic  Dominance: Right  Left Anterior Descending  Ost LAD to Prox LAD lesion 100% stenosed  Ost LAD to Prox LAD lesion is 100% stenosed.  Ramus Intermedius  Ost Ramus lesion 70% stenosed  Ost Ramus lesion is 70% stenosed.  Left Circumflex  Ost Cx to Prox Cx lesion 50% stenosed  Ost Cx to Prox Cx lesion  is 50% stenosed.  Right Coronary Artery  Prox RCA to Mid RCA lesion 25% stenosed  Prox RCA to Mid RCA lesion is 25% stenosed.  Intervention   Ost LAD to Prox LAD lesion  Angioplasty  Post-Intervention Lesion Assessment  The intervention was  unsuccessful due to inability to cross the lesion with wire. Pre-interventional TIMI flow is 0. Post-intervention TIMI flow is 0. At this lesion, acute closure of the vessel occurred.  There is a 100% residual stenosis post intervention.  Right Heart   Right Heart Pressures Hemodynamic findings consistent with severe pulmonary hypertension.  Right Atrium Right atrial pressure is elevated.  Impella/IABP   Hemodynamic Support An IABP was inserted post-PCI. Access site: left femoral artery.  Left Heart   Left Ventricle LV end diastolic pressure is severely elevated.  Coronary Diagrams   Diagnostic Diagram       Post-Intervention Diagram       Implants     No implant documentation for this case.  MERGE Images   Show images for CARDIAC CATHETERIZATION   Link to Procedure Log   Procedure Log    Hemo Data    Most Recent Value  Fick Cardiac Output 2.01 L/min  Fick Cardiac Output Index 1.35 (L/min)/BSA  RA A Wave 24 mmHg  RA V Wave 25 mmHg  RA Mean 17 mmHg  RV Systolic Pressure 66 mmHg  RV Diastolic Pressure 10 mmHg  RV EDP 0 mmHg  PA Systolic Pressure 71 mmHg  PA Diastolic Pressure 45 mmHg  PA Mean 54 mmHg  PW A Wave 45 mmHg  PW V Wave 50 mmHg  PW Mean 43 mmHg  AO Systolic Pressure 353 mmHg  AO Diastolic Pressure 79 mmHg  AO Mean 91 mmHg  LV Systolic Pressure 299 mmHg  LV Diastolic Pressure 1 mmHg  LV EDP 3 mmHg  Arterial Occlusion Pressure Extended Systolic Pressure 242 mmHg  Arterial Occlusion Pressure Extended Diastolic Pressure 77 mmHg  Arterial Occlusion Pressure Extended Mean Pressure 90 mmHg  Left Ventricular Apex Extended Systolic Pressure 683 mmHg  Left Ventricular Apex Extended Diastolic Pressure 2 mmHg  Left Ventricular Apex Extended EDP Pressure 22 mmHg  QP/QS 1  TPVR Index 39.98 HRUI  TSVR Index 67.38 HRUI  PVR SVR Ratio 0.15  TPVR/TSVR Ratio 0.59      Impression:  ST segment elevation myocardial infarction involving occlusion of the  proximal left anterior descending coronary artery subsequently complicated by the development of acute cardiogenic shock and acute respiratory failure following acute occlusion of left circumflex coronary artery during attempt at PCI.  The patient's hemodynamics remain extremely tenuous but have improved somewhat since blood flow was reestablished to the left circumflex coronary artery and intra-aortic balloon pump has been placed.  The patient's only reasonable chance that survival would require surgical revascularization and she will not survive without emergent intervention to treat her cardiogenic shock.  The patient's clinical history and long-term prognosis is complicated by the presence of a history of small cell lung cancer treated with definitive chemotherapy and mediastinal radiation therapy in 2015.  The patient has no known signs of clinical recurrence but has not had f/u imaging nor been seen in follow-up at the cancer center since 2016 for unclear reasons.   Plan:  I was able to speak with the patient's sister over the telephone and briefly discussed circumstances surrounding the patient's condition.  The patient's sister understands the need to proceed directly to the operating room for an attempted emergency salvage surgical intervention.  Very high risks were explained including the significant chance the patient might not survive.  All questions answered.   I spent in excess of 60 minutes during the conduct of this hospital consultation and >50% of this time involved direct face-to-face encounter for counseling and/or coordination of the patient's care.    Valentina Gu. Roxy Manns, MD 02/03/2018 11:48 AM

## 2018-02-03 NOTE — Transfer of Care (Signed)
Immediate Anesthesia Transfer of Care Note  Patient: Brittany [name]  Procedure(s) Performed: INTRAOPERATIVE TRANSESOPHAGEAL ECHOCARDIOGRAM (N/A ) CORONARY ARTERY BYPASS GRAFTING (CABG) (N/A Chest) PLACEMENT OF IMPELLA LEFT VENTRICULAR ASSIST DEVICE LD  Patient Location: SICU  Anesthesia Type:General  Level of Consciousness: Patient remains intubated per anesthesia plan  Airway & Oxygen Therapy: Patient remains intubated per anesthesia plan  Post-op Assessment: Report given to RN and Post -op Vital signs reviewed and stable  Post vital signs: Reviewed and stable  Last Vitals:  Vitals:   02/03/18 1223 02/03/18 1228  BP: (!) 92/52 91/63  Pulse: (!) 173 (!) 160  Resp: (!) 22 (!) 23  Temp:    SpO2: 100% 100%    Last Pain:  Vitals:   02/03/18 1119  PainSc: 10-Worst pain ever         Complications: No apparent anesthesia complications

## 2018-02-03 NOTE — Anesthesia Procedure Notes (Signed)
Procedure Name: Intubation Date/Time: 02/03/2018 11:30 AM Performed by: [name], CRNA Pre-anesthesia Checklist: Patient identified, Emergency Drugs available, Suction available and Patient being monitored Patient Re-evaluated:Patient Re-evaluated prior to induction Oxygen Delivery Method: Ambu bag Induction Type: IV induction, Rapid sequence and Cricoid Pressure applied Laryngoscope Size: Glidescope and 3 Grade View: Grade I Tube type: Subglottic suction tube Tube size: 7.5 mm Number of attempts: 1 Airway Equipment and Method: Stylet and Video-laryngoscopy Placement Confirmation: ETT inserted through vocal cords under direct vision,  positive ETCO2 and breath sounds checked- equal and bilateral Secured at: 22 cm Tube secured with: Tape Dental Injury: Teeth and Oropharynx as per pre-operative assessment  Comments: Called to cath lab for stat intubation. Patient alert and responds to commands, requiring levophed at max gtt. RSI performed with Dr. Roanna Banning at bedside. Grade I view with Glidescope. Atraumatic oral intubation.

## 2018-02-03 NOTE — Consult Note (Addendum)
Cardiology Consultation:   Patient ID: Brittany [name]; [id number]; Dec 08, 1961   Admit date: 02/03/2018 Date of Consult: 02/03/2018  Primary Care Provider: System, Pcp Not In Primary Cardiologist: Dr. Candee Furbish   Patient Profile:   Brittany [name] is a 57 y.o. female with a history of PAF and CHADSVASC score of 2, hypertension, previous small cell lung cancer, and family history of premature CAD who is being seen today for the evaluation of chest tightness and shortness of breath associated with abnormal ECG and elevated troponin I consistent with ACS at the request of Dr. Thomasene Lot.  History of Present Illness:   Brittany [name] presents to the ER reporting worsening chest tightness and shortness of breath over a period of several days culminating and intense symptoms today.  She has been wearing an event monitor per Dr. Marlou Porch for evaluation of recurrent atrial fibrillation.  Her CHADSVASC score is 2 but she has chosen not to take Xarelto so far.  I also see mention that she has had atrial tachycardia by prior monitoring.  She does not specifically endorse palpitations with her symptoms but has not felt well, nauseated, poor appetite.  On evaluation in the ER she is noted to be tachycardic with heart rate in the 120s-140s, possibly sinus tachycardia although difficult to completely exclude an ectopic atrial rhythm.  She is not in atrial fibrillation however.  She also has a new left bundle branch block by ECG, whether or not this is rate related is also not clear.  Troponin I level is greater than 30 consistent with ACS, she also has an increased lactate and the question of a new cardiomyopathy is to be considered.  Her chest x-ray shows no pulmonary edema.  She is not hypotensive.  STEMI was originally called with discussion per Dr. Martinique, and I was subsequently asked to evaluate the patient.  Family history significant for premature CAD in her mother who died of a  heart attack.  Past Medical History:  Diagnosis Date  . Allergy   . Anxiety   . Asthma   . Hypertension   . PAF (paroxysmal atrial fibrillation) ([place])   . Prophylactic measure 08/03/14-08/19/14   Prophyl. cranial radiation 24 Gy  . Small cell lung cancer ([place]) 03/16/2014    Past Surgical History:  Procedure Laterality Date  . CESAREAN SECTION    . MEDIASTINOSCOPY N/A 03/11/2014   Procedure: MEDIASTINOSCOPY;  Surgeon: Melrose Nakayama, MD;  Location: [facility];  Service: Thoracic;  Laterality: N/A;  . TUBAL LIGATION    . VIDEO BRONCHOSCOPY WITH ENDOBRONCHIAL ULTRASOUND N/A 03/11/2014   Procedure: VIDEO BRONCHOSCOPY WITH ENDOBRONCHIAL ULTRASOUND;  Surgeon: Melrose Nakayama, MD;  Location: [facility];  Service: Thoracic;  Laterality: N/A;     Inpatient Medications: Scheduled Meds: . heparin  3,200 Units Intravenous Once   Continuous Infusions: . heparin    . piperacillin-tazobactam    . sodium chloride    . vancomycin      Allergies:    Allergies  Allergen Reactions  . Codeine Nausea And Vomiting  . Latex Hives and Other (See Comments)    Skin irritation  . Pineapple Hives  . Iodinated Diagnostic Agents Hives and Rash  . Sulfa Antibiotics Hives and Rash    Social History:   Social History   Socioeconomic History  . Marital status: Married    Spouse name: Not on file  . Number of children: Not on file  . Years of education: Not on file  . Highest education  level: Not on file  Social Needs  . Financial resource strain: Not on file  . Food insecurity - worry: Not on file  . Food insecurity - inability: Not on file  . Transportation needs - medical: Not on file  . Transportation needs - non-medical: Not on file  Occupational History  . Not on file  Tobacco Use  . Smoking status: Former Smoker    Packs/day: 1.00    Years: 20.00    Pack years: 20.00    Types: Cigarettes    Last attempt to quit: 03/13/2014    Years since quitting: 3.8  . Smokeless tobacco: Never Used    Substance and Sexual Activity  . Alcohol use: Yes    Alcohol/week: 4.8 oz    Types: 8 Glasses of wine per week  . Drug use: No  . Sexual activity: No  Other Topics Concern  . Not on file  Social History Narrative  . Not on file    Family History:   The patient's family history includes Cancer in her maternal grandmother; Diabetes in her paternal grandmother; Heart attack in her mother; Heart disease in her mother; Hypertension in her maternal grandmother and mother. There is no history of Stroke.  ROS:  Please see the history of present illness.  Fatigue, weakness in the last few weeks.  All other ROS reviewed and negative.     Physical Exam/Data:   Vitals:   02/03/18 0830 02/03/18 0848  BP: (!) 106/92   Pulse: (!) 132   Resp: 20   Temp: (!) 97.3 F (36.3 C)   SpO2: 100% 98%   No intake or output data in the 24 hours ending 02/03/18 1003 There were no vitals filed for this visit. There is no height or weight on file to calculate BMI.   Gen: Thin woman in moderate distress complaining of shortness of breath and chest discomfort. HEENT: Conjunctiva and lids normal, oropharynx clear. Neck: Supple, elevated JVP, no carotid bruits. Lungs: Clear to auscultation, nonlabored breathing at rest. Cardiac: Rapid RRR, indistinct PMI,, no pericardial rub. Abdomen: Soft, nontender, bowel sounds present, no guarding or rebound. Extremities: No pitting edema, distal pulses 2+. Skin: Warm and dry. Musculoskeletal: No kyphosis. Neuropsychiatric: Alert and oriented x3, affect grossly appropriate.  EKG:  I personally reviewed the tracing from 02/03/2018 which shows possible sinus tachycardia with left bundle branch block, anterior ST elevations.  Cannot completely exclude an ectopic atrial tachycardia.  Telemetry:  I personally reviewed telemetry which shows probable sinus tachycardia.  Relevant CV Studies:  Echocardiogram 03/12/2014: Study Conclusions  - Left ventricle: The cavity  size was normal. Wall thickness was increased in a pattern of moderate LVH. Systolic function was normal. The estimated ejection fraction was in the range of 50% to 55%. Wall motion was normal; there were no regional wall motion abnormalities. Doppler parameters are consistent with abnormal left ventricular relaxation (grade 1 diastolic dysfunction). - Pericardium, extracardiac: A trivial pericardial effusion was identified. Impressions:  - Possible mass compressing right pulmonary artery on parasternal short axis views; suggest CT to further assess.  Laboratory Data:  Chemistry Recent Labs  Lab 02/03/18 0853  NA 135  K 4.0  CL 99*  CO2 17*  GLUCOSE 344*  BUN 23*  CREATININE 1.17*  CALCIUM 10.3  GFRNONAA 51*  GFRAA 59*  ANIONGAP 19*    Recent Labs  Lab 02/03/18 0853  PROT 8.2*  ALBUMIN 4.5  AST <5*  ALT 55*  ALKPHOS 76  BILITOT 1.2  Hematology Recent Labs  Lab 02/03/18 0853  WBC 11.7*  RBC 5.33*  HGB 18.1*  HCT 53.9*  MCV 101.1*  MCH 34.0  MCHC 33.6  RDW 20.9*  PLT 204   Cardiac EnzymesNo results for input(s): TROPONINI in the last 168 hours.  Recent Labs  Lab 02/03/18 0908  TROPIPOC >30.00*    BNPNo results for input(s): BNP, PROBNP in the last 168 hours.  DDimer  Recent Labs  Lab 02/03/18 0853  DDIMER 1.07*    Radiology/Studies:  Dg Chest 2 View  Result Date: 02/03/2018 CLINICAL DATA:  Epigastric pain.  Cough. EXAM: CHEST  2 VIEW COMPARISON:  October 14, 2017 FINDINGS: Elevation of the right hemidiaphragm. The heart, hila, and mediastinum are stable. No pneumothorax. No nodules or masses. No focal infiltrates identified. The left upper chest is obscured by an overlapping device. IMPRESSION: No active cardiopulmonary disease. Electronically Signed   By: Dorise Bullion III M.D   On: 02/03/2018 09:35    Assessment and Plan:   1.  Presentation with chest tightness and worsening shortness of breath, acutely worse in the  last 12 hours.  ECG has a left bundle branch block pattern which is new although heart rate is elevated and whether this represents a rate related bundle is not clear.  Troponin I level is however greater than 30 consistent with ACS.  She has a family history of premature CAD in her mother who died of a myocardial infarction.  No personal history of obstructive CAD or cardiomyopathy.  Last assessment of LVEF was in 2015.  2.  Paroxysmal atrial fibrillation with CHADSVASC of 2.  She has been wearing an outpatient monitor per Dr. Marlou Porch for assessment of rhythm burden as a way of determining whether anticoagulation will be pursued or not.  She had previously declined Xarelto.  Also I see mention of atrial tachycardia.  Her current rhythm looks to be sinus tachycardia but I cannot completely exclude an ectopic atrial rhythm, does not appear to be clear-cut atrial flutter and is certainly not atrial fibrillation.  3.  Essential hypertension.  4.  History of asthma.  5.  History of non-small cell cancer.  No acute disease by chest x-ray.  I reviewed the patient's presentation, recent outpatient notes, and current available information.  Although I am not certain as to whether this truly represents a STEMI with new left bundle branch block, her clinical status is very concerning for ACS and an urgent cardiac catheterization is warranted.  This information has been relayed to Dr. Martinique.  It could be that she is in an atrial tachycardia with rate related left bundle branch block, although it would be hard to imagine that this in an of itself would result in a troponin I level greater than 30.  She might also have an acute cardiomyopathy with increased lactate.  Left and right heart catheterization may need to be considered.  She also needs an echocardiogram.  She will be transferred from [facility] long hospital to the cardiac catheterization lab and subsequently ICU for further management. She has been started on  ASA and Heparin.   Signed, Rozann Lesches, MD  02/03/2018 10:03 AM

## 2018-02-03 NOTE — Anesthesia Preprocedure Evaluation (Addendum)
Anesthesia Evaluation  Patient identified by MRN, date of birth, ID band Patient unresponsive    Reviewed: Allergy & Precautions, NPO status , Patient's Chart, lab work & pertinent test results  Airway Mallampati: Intubated  TM Distance: >3 FB Neck ROM: Full    Dental no notable dental hx.    Pulmonary asthma , former smoker,   small cell lung cancer   breath sounds clear to auscultation   + intubated    Cardiovascular hypertension, Pt. on medications + CAD  Normal cardiovascular exam+ dysrhythmias  Rhythm:Regular Rate:Normal  ECG: ST, rate 130   Neuro/Psych Anxiety negative neurological ROS     GI/Hepatic negative GI ROS, Neg liver ROS,   Endo/Other  negative endocrine ROS  Renal/GU negative Renal ROS     Musculoskeletal negative musculoskeletal ROS (+)   Abdominal   Peds  Hematology negative hematology ROS (+)   Anesthesia Other Findings Hyperglycemia   Reproductive/Obstetrics                             Anesthesia Physical Anesthesia Plan  ASA: V and emergent  Anesthesia Plan: General   Post-op Pain Management:    Induction: Intravenous  PONV Risk Score and Plan: 3 and Treatment may vary due to age or medical condition  Airway Management Planned: Oral ETT  Additional Equipment: Arterial line, CVP, PA Cath, TEE and Ultrasound Guidance Line Placement  Intra-op Plan:   Post-operative Plan: Post-operative intubation/ventilation  Informed Consent: I have reviewed the patients History and Physical, chart, labs and discussed the procedure including the risks, benefits and alternatives for the proposed anesthesia with the patient or authorized representative who has indicated his/her understanding and acceptance.     Plan Discussed with: CRNA  Anesthesia Plan Comments:        Anesthesia Quick Evaluation

## 2018-02-03 NOTE — Progress Notes (Signed)
[facility] BRIEF SICU PROGRESS NOTE  Day of Surgery  S/P Procedure(s) (LRB): INTRAOPERATIVE TRANSESOPHAGEAL ECHOCARDIOGRAM (N/A) CORONARY ARTERY BYPASS GRAFTING (CABG) (N/A) PLACEMENT OF IMPELLA LEFT VENTRICULAR ASSIST DEVICE LD   Sedated on vent NSR w/ stable hemodynamics, Impella flows 4.6-4.8 L/min, cardiac index 1.7-2.3, PA pressures low Milrinone @ 0.5, Epi @ 1, Levophed @ 5 O2 sats 100% Chest tube output 100 last hour Excellent UOP Labs okay although Hgb 8.4 and INR 2.3  Plan: Will transfuse PRBC's and FFP.  Monitor chest tube output closely. Keep sedated on vent.  D/C IABP and radial artery sheath in am tomorrow if patient remains stable.  Rexene Alberts, MD 02/03/2018 8:38 PM

## 2018-02-03 NOTE — Consult Note (Signed)
Name: Brittany [name] MRN: [id number] DOB: Mar 13, 1961    ADMISSION DATE:  02/03/2018 CONSULTATION DATE:  02/03/2018  REFERRING MD :  Dr. Roxy Manns   CHIEF COMPLAINT:  Cardiogenic Shock   HISTORY OF PRESENT ILLNESS:   57 year old female with PMH of Small Cell Lung CA s/p Chemo/Radiation 2016, PAF (Follwed by Dr. Marlou Porch), HTN, Asthma  Presents to ED with reported 2 days of dyspnea, nausea/vomiting, chest tightness, and epigastric pain. Upon arrival tachypneic and tachycardiac. Troponin >30, LA 7, EKG with new left bundle branch block, Code STEMI activated, taken to Cath Lab. In cath lab patient was found to have severe CAD with likely acute occlusion of the ostial left anterior descending coronary artery, while passing wire patient developed acute occlusion of the left circumflex coronary artery which was complicated by the development of cardiogenic shock. Intra-aortic balloon pump placed. Taken to OR for emergent CABG and Impella placement. Remains Intubated post-operatively. PCCM asked to consult.   SIGNIFICANT EVENTS  2/24 > Presents to ED   STUDIES:  CXR 2/24 > No acute  CXR 2/24 > Endotracheal tube is 2 cm above the carina. Swan-Ganz catheter tip is in the central right pulmonary artery. NG tube enters the stomach. Bilateral chest tubes in place. No pneumothorax. Heart is borderline in size. Mild perihilar atelectasis or edema. No effusions.  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  has a past surgical history that includes Tubal ligation; Cesarean section; Video bronchoscopy with endobronchial ultrasound (N/A, 03/11/2014); and Mediastinoscopy (N/A, 03/11/2014). Prior to Admission medications   Medication Sig  Start Date End Date Taking? Authorizing Provider  albuterol (PROVENTIL HFA) 108 (90 BASE) MCG/ACT inhaler Inhale 2 puffs into the lungs every 4 (four) hours as needed for wheezing or shortness of breath. 02/24/14   Roselee Culver, MD  albuterol (PROVENTIL) (2.5 MG/3ML) 0.083% nebulizer solution Take 2.5 mg by nebulization every 6 (six) hours as needed for wheezing or shortness of breath (wheezing and shortness of breath).    [provider]  cetirizine (ZYRTEC) 10 MG tablet Take 10 mg daily as needed by mouth for allergies.    [provider]  fluticasone (FLONASE) 50 MCG/ACT nasal spray Place 2 sprays daily as needed into both nostrils for allergies.    [provider]  lisinopril (PRINIVIL,ZESTRIL) 10 MG tablet Take 10 mg by mouth daily. 11/27/17   [provider]   Allergies  Allergen Reactions  . Codeine Nausea And Vomiting  . Latex Hives and Other (See Comments)    Skin irritation  . Pineapple Hives  . Iodinated Diagnostic Agents Hives and Rash  . Sulfa Antibiotics Hives and Rash    FAMILY HISTORY:  family history includes Cancer in her maternal grandmother; Diabetes in her paternal grandmother; Heart attack in her mother; Heart disease in her mother; Hypertension in her maternal grandmother and mother. SOCIAL HISTORY:  reports that she quit smoking about 3 years ago. Her smoking use included cigarettes. She has a 20.00 pack-year smoking history. she has never used smokeless tobacco. She reports that she drinks about 4.8 oz of alcohol per week. She reports that she does not use drugs.  REVIEW OF SYSTEMS:   Unable to review as patient is intubated and sedated   SUBJECTIVE:   VITAL SIGNS:  Temp:  [94.6 F (34.8 C)-97.9 F (36.6 C)] 97.9 F (36.6 C) (02/24 2145) Pulse Rate:  [37-268] 69 (02/24 2145) Resp:  [10-36] 20 (02/24 2145) BP: (91-149)/(38-106) 107/68 (02/24 2200) SpO2:  [91 %-100 %] 99 % (02/24 2145) FiO2 (%):  [100 %] 100 % (02/24  1929) Weight:  [53 kg (116 lb 13.5 oz)] 53 kg (116 lb 13.5 oz) (02/24 1119)  PHYSICAL EXAMINATION: General:  Adult female, on vent  Neuro:  Sedated, does not follow commands, pupils 2 mm and sluggish  HEENT:  ETT in place  Cardiovascular:  Balloon pump and impella in place,  tachy  Lungs:  Mediastinal and Right Pleural chest tube in place, no wheeze, coarse breath sounds  Abdomen:  Non-distended, active bowel sounds  Musculoskeletal:  -edema  Skin:  Warm, dry   Recent Labs  Lab 02/03/18 0853  02/03/18 1506 02/03/18 1629 02/03/18 1713 02/03/18 1858  NA 135   < > 143 143 145 149*  K 4.0   < > 5.3* 5.2* 4.1 3.6  CL 99*   < > 109 111 110  --   CO2 17*  --   --   --   --   --   BUN 23*   < > 19 21* 20  --   CREATININE 1.17*   < > 0.70 0.80 0.80  --   GLUCOSE 344*   < > 251* 248* 226* 167*   < > = values in this interval not displayed.   Recent Labs  Lab 02/03/18 0853  02/03/18 1451  02/03/18 1730 02/03/18 1858 02/03/18 1900  HGB 18.1*   < > 9.0*   < > 10.4* 8.5* 8.4*  HCT 53.9*   < > 27.8*   < > 31.9* 25.0* 25.6*  WBC 11.7*  --   --   --   --   --  9.0  PLT 204  --  109*  --  65*  --  189   < > = values in this interval not displayed.   Dg Chest 2 View  Result Date: 02/03/2018 CLINICAL DATA:  Epigastric pain.  Cough. EXAM: CHEST  2 VIEW COMPARISON:  October 14, 2017 FINDINGS: Elevation of the right hemidiaphragm. The heart, hila, and mediastinum are stable. No pneumothorax. No nodules or masses. No focal infiltrates identified. The left upper chest is obscured by an overlapping device. IMPRESSION: No active cardiopulmonary disease. Electronically Signed   By: Dorise Bullion III M.D   On: 02/03/2018 09:35   Dg Chest Port 1 View  Result Date: 02/03/2018 CLINICAL DATA:  Post CABG EXAM: PORTABLE CHEST 1 VIEW COMPARISON:  02/03/2018 FINDINGS: Endotracheal tube is 2 cm above the carina. Swan-Ganz catheter tip is in the central right pulmonary artery. NG tube enters the stomach.  Bilateral chest tubes in place. No pneumothorax. Heart is borderline in size. Mild perihilar atelectasis or edema. No effusions. IMPRESSION: Postoperative changes from CABG.  No pneumothorax. Bilateral perihilar atelectasis or early edema. Electronically Signed   By: Rolm Baptise M.D.   On: 02/03/2018 19:23    ASSESSMENT / PLAN:  Acute Hypoxic/Hypercarbic Respiratory Failure in setting of Cardiogenic Shock with pulmonary edema  H/O Small Cell Lung CA s/p Chemo/Radiation 2016 Plan  -Vent Support > Patient changed from SIMV to PRVC  -Trend ABG > Repeat in one hour -Trend CXR  -Schedule Duonebs  -Pulmonary Hygiene   Cardiogenic Shock in setting of STEMI s/p CABG, Impella, and Balloon Pump placement  Severe CAD, PAF  Cath showed: LM: normal LAD: 100% proximal LCX 95% ostial RCA ok  Plan  -Per Cardiology/CardioThoracic Surgery  -Cardiac Monitoring  -Currently on 1 mcg/min Levophed, 74mcg/min EPI, and 64ml/hr Milrinone with goal MAP >65  -Trend Troponin  -Cooxy Panel in AM  -CT Surgery with plans to remove IABP and radial artery sheath in the morning if patient remains stable   Anion Gap Metabolic Acidosis in setting of Lactic Acidosis  Plan  -Trend BMP  -Trend LA  -Replace electrolytes as indicated   Acute Blood loss Anemia  Plan  -Trend CBC  -Receiving 2 units RBC and 2 FFP -Maintain Hbg >7  Sedation Needs  Plan  -RASS goal 0/-1 -Currently on Predex gtt -Wean Fentanyl gtt to achieve RASS   Hayden Pedro, AGACNP-BC [facility]  Pgr: [phone]  PCCM Pgr: [phone]

## 2018-02-03 NOTE — ED Provider Notes (Signed)
[facility] DEPT Provider Note   CSN: [id number] Arrival date & time: 02/03/18  1308     History   Chief Complaint Chief Complaint  Patient presents with  . Shortness of Breath  . Abdominal Pain    HPI Brittany [name] is a 57 y.o. female.  HPI   Patient is a 57 year old female presenting with multiple complaints including shortness of breath, difficult to lying flat due to secondary shortness of breath, nausea, vomiting times 2 days.  Patient past medical history significant for paroxysmal A. 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  Patient reports she had some epigastric pain and vomiting starting on Friday and then had shortness of breath that was increasing until today.  Patient reports fever and chills.  Patient had  large volume emesis on arrival.  Past Medical History:  Diagnosis Date  . Allergy   . Anxiety   . Asthma   . Atrial fibrillation ([place])   . Hypertension   . Prophylactic measure 08/03/14-08/19/14   Prophyl. cranial radiation 24 Gy  . Small cell lung cancer ([place]) 03/16/2014    Patient Active Problem List   Diagnosis Date Noted  . Prophylactic measure   . Hypertension   . Asthma   . Anxiety   . Allergy   . CAP (community acquired pneumonia) 03/15/2015  . Chest pain 03/15/2015  . Pneumonia 03/15/2015  . Lung cancer ([place])   . Small cell lung cancer ([place]) 03/16/2014  . Protein-calorie malnutrition, severe ([place]) 03/14/2014  . Atrial fibrillation ([place]) 03/12/2014  . SVT (supraventricular tachycardia) ([place]) 03/11/2014  . Lung mass 03/10/2014  . HTN (hypertension) 05/07/2012  . Asthma 05/07/2012    Past Surgical History:  Procedure Laterality Date  . CESAREAN SECTION    . MEDIASTINOSCOPY N/A 03/11/2014   Procedure: MEDIASTINOSCOPY;  Surgeon: Melrose Nakayama, MD;  Location: [facility];  Service: Thoracic;  Laterality: N/A;  . TUBAL LIGATION    . VIDEO BRONCHOSCOPY WITH ENDOBRONCHIAL  ULTRASOUND N/A 03/11/2014   Procedure: VIDEO BRONCHOSCOPY WITH ENDOBRONCHIAL ULTRASOUND;  Surgeon: Melrose Nakayama, MD;  Location: [facility];  Service: Thoracic;  Laterality: N/A;    OB History    No data available       Home Medications    Prior to Admission medications   Medication Sig Start Date End Date Taking? Authorizing Provider  albuterol (PROVENTIL HFA) 108 (90 BASE) MCG/ACT inhaler Inhale 2 puffs into the lungs every 4 (four) hours as needed for wheezing or shortness of breath. 02/24/14   Roselee Culver, MD  albuterol (PROVENTIL) (2.5 MG/3ML) 0.083% nebulizer solution Take 2.5 mg by nebulization every 6 (six) hours as needed for wheezing or shortness of breath (wheezing and shortness of breath).    [provider]  cetirizine (ZYRTEC) 10 MG tablet Take 10 mg daily as needed by mouth for allergies.    [provider]  fluticasone (FLONASE) 50 MCG/ACT nasal spray Place 2 sprays daily as needed into both nostrils for allergies.    [provider]  lisinopril (PRINIVIL,ZESTRIL) 10 MG tablet Take 10 mg by mouth daily. 11/27/17   [provider]    Family History Family History  Problem Relation Age of Onset  . Heart disease Mother   . Hypertension Mother   . Heart attack Mother   . Hypertension Maternal Grandmother   . Cancer Maternal Grandmother   . Diabetes Paternal Grandmother   . Stroke Neg Hx  Social History Social History   Tobacco Use  . Smoking status: Former Smoker    Packs/day: 1.00    Years: 20.00    Pack years: 20.00    Types: Cigarettes    Last attempt to quit: 03/13/2014    Years since quitting: 3.8  . Smokeless tobacco: Never Used  Substance Use Topics  . Alcohol use: Yes    Alcohol/week: 4.8 oz    Types: 8 Glasses of wine per week  . Drug use: No     Allergies   Codeine; Latex; Pineapple; Iodinated diagnostic agents; and Sulfa antibiotics   Review of Systems Review of Systems  Constitutional:  Positive for fatigue and fever. Negative for activity change.  HENT: Negative for congestion.   Respiratory: Positive for cough and shortness of breath.   Cardiovascular: Negative for chest pain.  Gastrointestinal: Positive for abdominal pain, nausea and vomiting.  All other systems reviewed and are negative.    Physical Exam Updated Vital Signs BP (!) 106/92 (BP Location: Right Arm)   Pulse (!) 132   Temp (!) 97.3 F (36.3 C)   Resp 20   SpO2 98%   Physical Exam  Constitutional: She is oriented to person, place, and time. She appears well-developed and well-nourished.  HENT:  Head: Normocephalic and atraumatic.  Eyes: EOM are normal. Pupils are equal, round, and reactive to light. Right eye exhibits no discharge.  Cardiovascular: Normal rate, regular rhythm and normal heart sounds.  No murmur heard. Pulmonary/Chest: Accessory muscle usage present. Tachypnea noted. She has wheezes. She has no rales.  Abdominal: Soft. She exhibits no distension. There is tenderness.  Mild epigastric tenderness.  Neurological: She is oriented to person, place, and time.  Skin: Skin is warm and dry. She is not diaphoretic.  Psychiatric: She has a normal mood and affect.  Nursing note and vitals reviewed.    ED Treatments / Results  Labs (all labs ordered are listed, but only abnormal results are displayed) Labs Reviewed  COMPREHENSIVE METABOLIC PANEL  CBC WITH DIFFERENTIAL/PLATELET  LIPASE, BLOOD  D-DIMER, QUANTITATIVE (NOT AT [facility])  URINALYSIS, ROUTINE W REFLEX MICROSCOPIC  BRAIN NATRIURETIC PEPTIDE  I-STAT CG4 LACTIC ACID, ED  I-STAT TROPONIN, ED    EKG  EKG Interpretation  Date/Time:  Sunday February 03 2018 08:36:56 EST Ventricular Rate:  140 PR Interval:    QRS Duration: 157 QT Interval:  409 QTC Calculation: 625 R Axis:   -94 Text Interpretation:  Sinus tachycardia LBBB appears new from prior  Confirmed by [place], [place] ([id number]) on 02/03/2018 8:40:41 AM        Radiology No results found.  Procedures Procedures (including critical care time)  CRITICAL CARE Performed by: Gardiner Sleeper Total critical care time: 75 minutes Critical care time was exclusive of separately billable procedures and treating other patients. Critical care was necessary to treat or prevent imminent or life-threatening deterioration. Critical care was time spent personally by me on the following activities: development of treatment plan with patient and/or surrogate as well as nursing, discussions with consultants, evaluation of patient's response to treatment, examination of patient, obtaining history from patient or surrogate, ordering and performing treatments and interventions, ordering and review of laboratory studies, ordering and review of radiographic studies, pulse oximetry and re-evaluation of patient's condition.   Medications Ordered in ED Medications  sodium chloride 0.9 % bolus 1,000 mL (not administered)  albuterol (PROVENTIL) (2.5 MG/3ML) 0.083% nebulizer solution 5 mg (5 mg Nebulization Given 02/03/18 0844)     Initial  Impression / Assessment and Plan / ED Course  I have reviewed the triage vital signs and the nursing notes.  Pertinent labs & imaging results that were available during my care of the patient were reviewed by me and considered in my medical decision making (see chart for details).     Patient is a 57 year old female presenting with multiple complaints including shortness of breath, difficult to lying flat due to secondary shortness of breath, nausea, vomiting times 2 days.  Patient past medical history significant for paroxysmal A. 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  Patient reports she had some epigastric pain and vomiting starting on Friday and then had shortness of breath that was increasing until today.  Patient reports fever and chills.  Patient had  large volume emesis on arrival.  On  arrival patient tachypneic, tachycardic.  Denies any chest pain, endorses shortness of breath.  Unclear what happened first.  But on the differential is late MI, new CHF, pneumonia vs flu vs myocarditis.  9:53 AM Trop came back >>30, Lactic > 7.  Called Dr. Martinique ,with elevateed trop and clinical presentation.  No indication curecntly for cath, recommended further discussion with cards.  Discussed with Dr. Domenic Polite. Saw patient together, decided to discuss with Dr. Martinique again, will go to cath lab emergently.    Given patient's elevated troponin, shortness of breath, acute presentation, new left bundle branch block and clinical picture, will go to Cath Lab to rule out acute ischemia.  Otherwise could consider myocarditis, sepsis.  Code STEMI called.   Final Clinical Impressions(s) / ED Diagnoses   Final diagnoses:  None    ED Discharge Orders    None       Macarthur Critchley, MD 02/04/18 [phone]

## 2018-02-03 NOTE — ED Notes (Signed)
Per cardiology, hold abx at this time.

## 2018-02-03 NOTE — Progress Notes (Signed)
A consult was received from an ED physician for vancomycin and zosyn per pharmacy dosing.  The patient's profile has been reviewed for ht/wt/allergies/indication/available labs.   A one time order has been placed for Vancomycin 1g and Zosyn 3.375g.  Further antibiotics/pharmacy consults should be ordered by admitting physician if indicated.                       Thank you, Hershal Coria 02/03/2018  9:42 AM

## 2018-02-03 NOTE — Progress Notes (Signed)
   02/03/18 1900  Clinical Encounter Type  Visited With Patient  Visit Type Initial  Referral From Nurse  Consult/Referral To Chaplain  Spiritual Encounters  Spiritual Needs Emotional  Stress Factors  Patient Stress Factors Exhausted  Family Stress Factors Exhausted    This was a code Stemi pt that was immediately taken to Cath lab and later operation room. It took so long for the two procedures due to what seemingly developed. I visited later again before I left at 8:00pm to check on her on two heart and she was intubated and seemingly still very sick. The family members had left site and I told the nurse to call as soon as they showed up. I provided emotional support and compassionate presence.  Vollie Brunty a Medical sales representative, [employer]

## 2018-02-03 NOTE — Brief Op Note (Signed)
02/03/2018  6:10 PM  PATIENT:  Brittany [name]  57 y.o. female  PRE-OPERATIVE DIAGNOSIS:  CAD  POST-OPERATIVE DIAGNOSIS:  CAD  PROCEDURE:  Procedure(s):  EMERGENT CORONARY BYPASS GRAFTING x 3 -Left Internal Mammary Artery to Left Anterior Descending -Saphenous Vein Graft to First Diagonal -Saphenous Vein Graft to First Obtuse Marginal Brach  PLACEMENT OF IMPELLA 5.0 LD  ENDOSCOPIC HARVEST GREATER SAPHENOUS VEIN -Right Thigh  INTRAOPERATIVE TRANSESOPHAGEAL ECHOCARDIOGRAM (N/A)   SURGEON:  Surgeon(s) and Role:    Rexene Alberts, MD - Primary  PHYSICIAN ASSISTANT: Ellwood Handler PA-C  ANESTHESIA:   general  ANESTHESIOLOGIST:   Murvin Natal, MD  EBL:  450 mL  BLOOD ADMINISTERED: CELLSAVER  DRAINS: Right and Left Pleural Chest Tubes, Mediastinal Chest Drains   LOCAL MEDICATIONS USED:  NONE  SPECIMEN:  No Specimen  DISPOSITION OF SPECIMEN:  N/A  COUNTS:  YES  TOURNIQUET:  * No tourniquets in log *  DICTATION: .Dragon Dictation  PLAN OF CARE: Admit to inpatient   PATIENT DISPOSITION:  ICU - intubated and hemodynamically stable.   Delay start of Pharmacological VTE agent (>24hrs) due to surgical blood loss or risk of bleeding: yes  Rexene Alberts, MD 02/03/2018 6:10 PM

## 2018-02-03 NOTE — Progress Notes (Signed)
CRITICAL VALUE ALERT  Critical Value:  Lactic acid 3.1  Date & Time Notied:  T 2320  Provider Notified: Jeris Penta CCM NP  Orders Received/Actions taken: Not at this time

## 2018-02-03 NOTE — Consult Note (Signed)
Advanced Heart Failure Team Consult Note   PCP-Cardiologist: Marlou Porch Consulting MD: [place]  Reason for Consultation: Cardiogenic shock  HPI:    Brittany [name] is a 57 y/o woman with a history of PAF,  preveious smoker quit 4 years ago, hypertension, 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 She was admitted with NSTEMI and shock. We are asked to see her by Dr. Martinique for assistance with shock management.  Was treated for [place] as above in 2015. Last seen by Dr. Julien Nordmann in 5/16. Chest CT at that time without evidence of recurrence. Has been lost to f/u since that time   Admitted with NSTEMI and new LBBB. Trop > 30 with BNP 3,127. Seen by Dr. Domenic Polite this am and was feeling poorly with nausea and weakness. Lactate 7.2 HR was in 130-140 range with ?AFL vs atrial tach.   Brought emergently to cath lab.  Cath showed: LM: normal LAD: 100% proximal LCX 95% ostial RCA ok   When wire placed in LAD there was plaque shift into ostial LCX with marked worsening of flow to LCX. Patient deteriorated and was intubated emergently by anesthesia. NE started.  LCX ballooned to improve flow.  Iliac system too diseased for Impella so IABP placed.Milrinone started. Given 1 dose of amio. Emergent TCTS consult placed and Dr. Roxy Manns responded immediately. Decision made for urgent CABG. Both I and Dr. Roxy Manns discussed the situation with her sister and stressed the fact that she was critically-ill and high-risk CABG was only chance of survival.   RHC numbers with PA pressures in 70s and PCWP 50. MV sat 59%  Review of Systems: [y] = yes, [ ]  = no   General: Weight gain [ ] ; Weight loss [ ] ; Anorexia Blue.Reese ]; Fatigue Blue.Reese ]; Fever [ ] ; Chills [ ] ; Weakness [ ]   Cardiac: Chest pain/pressure [ ] ; Resting SOB Blue.Reese ]; Exertional SOB Blue.Reese ]; Orthopnea [ y]; Pedal Edema [ ] ; Palpitations [ ] ; Syncope [ ] ; Presyncope [ ] ;  Paroxysmal nocturnal dyspnea[ y]  Pulmonary: Cough [ ] ; Wheezing[ ] ; Hemoptysis[ ] ; Sputum [ ] ; Snoring [ ]   GI: Vomiting[ ] ; Dysphagia[ ] ; Melena[ ] ; Hematochezia [ ] ; Heartburn[ ] ; Abdominal pain [ ] ; Constipation [ ] ; Diarrhea [ ] ; BRBPR [ ]   GU: Hematuria[ ] ; Dysuria [ ] ; Nocturia[ ]   Vascular: Pain in legs with walking [ ] ; Pain in feet with lying flat [ ] ; Non-healing sores [ ] ; Stroke [ ] ; TIA [ ] ; Slurred speech [ ] ;  Neuro: Headaches[ ] ; Vertigo[ ] ; Seizures[ ] ; Paresthesias[ ] ;Blurred vision [ ] ; Diplopia [ ] ; Vision changes [ ]   Ortho/Skin: Arthritis Blue.Reese ]; Joint pain [ y]; Muscle pain [ ] ; Joint swelling [ ] ; Back Pain [ ] ; Rash [ ]   Psych: Depression[ ] ; Anxiety[ ]   Heme: Bleeding problems [ ] ; Clotting disorders [ ] ; Anemia [ ]   Endocrine: Diabetes [ ] ; Thyroid dysfunction[ ]   Home Medications Prior to Admission medications   Medication Sig Start Date End Date Taking? Authorizing Provider  albuterol (PROVENTIL HFA) 108 (90 BASE) MCG/ACT inhaler Inhale 2 puffs into the lungs every 4 (four) hours as needed for wheezing or shortness of breath. 02/24/14   Roselee Culver, MD  albuterol (PROVENTIL) (2.5 MG/3ML) 0.083% nebulizer solution Take 2.5 mg by nebulization every 6 (six) hours as needed for wheezing or shortness of breath (wheezing and shortness of breath).    [provider]  cetirizine (ZYRTEC) 10 MG tablet Take 10 mg daily as needed by mouth for allergies.    [provider]  fluticasone (FLONASE) 50 MCG/ACT nasal spray Place 2 sprays daily as needed into both nostrils for allergies.    [provider]  lisinopril (PRINIVIL,ZESTRIL) 10 MG tablet Take 10 mg by mouth daily. 11/27/17   [provider]    Past Medical History: Past Medical History:  Diagnosis Date  . Acute systolic congestive heart failure ([place]) 02/03/2018  . Allergy   . Anxiety   . Asthma   . Hypertension   . PAF (paroxysmal atrial fibrillation) ([place])   . Prophylactic  measure 08/03/14-08/19/14   Prophyl. cranial radiation 24 Gy  . Small cell lung cancer ([place]) 03/16/2014    Past Surgical History: Past Surgical History:  Procedure Laterality Date  . CESAREAN SECTION    . MEDIASTINOSCOPY N/A 03/11/2014   Procedure: MEDIASTINOSCOPY;  Surgeon: Melrose Nakayama, MD;  Location: [facility];  Service: Thoracic;  Laterality: N/A;  . TUBAL LIGATION    . VIDEO BRONCHOSCOPY WITH ENDOBRONCHIAL ULTRASOUND N/A 03/11/2014   Procedure: VIDEO BRONCHOSCOPY WITH ENDOBRONCHIAL ULTRASOUND;  Surgeon: Melrose Nakayama, MD;  Location: [facility] OR;  Service: Thoracic;  Laterality: N/A;    Family History: Family History  Problem Relation Age of Onset  . Heart disease Mother   . Hypertension Mother   . Heart attack Mother   . Hypertension Maternal Grandmother   . Cancer Maternal Grandmother   . Diabetes Paternal Grandmother   . Stroke Neg Hx     Social History: Social History   Socioeconomic History  . Marital status: Married    Spouse name: None  . Number of children: None  . Years of education: None  . Highest education level: None  Social Needs  . Financial resource strain: None  . Food insecurity - worry: None  . Food insecurity - inability: None  . Transportation needs - medical: None  . Transportation needs - non-medical: None  Occupational History  . None  Tobacco Use  . Smoking status: Former Smoker    Packs/day: 1.00    Years: 20.00    Pack years: 20.00    Types: Cigarettes    Last attempt to quit: 03/13/2014    Years since quitting: 3.8  . Smokeless tobacco: Never Used  Substance and Sexual Activity  . Alcohol use: Yes    Alcohol/week: 4.8 oz    Types: 8 Glasses of wine per week  . Drug use: No  . Sexual activity: No  Other Topics Concern  . None  Social History Narrative  . None    Allergies:  Allergies  Allergen Reactions  . Codeine Nausea And Vomiting  . Latex Hives and Other (See Comments)    Skin irritation  . Pineapple Hives  .  Iodinated Diagnostic Agents Hives and Rash  . Sulfa Antibiotics Hives and Rash    Objective:    Vital Signs:   Temp:  [97.3 F (36.3 C)] 97.3 F (36.3 C) (02/24 0830) Pulse Rate:  [132] 132 (02/24 0830) Resp:  [20] 20 (02/24 0830) BP: (106-110)/(38-92) 110/38 (02/24 1151) SpO2:  [98 %-100 %] 99 % (02/24 1037) FiO2 (%):  [100 %] 100 % (02/24 1119) Weight:  [53 kg (116 lb 13.5 oz)] 53 kg (116 lb 13.5 oz) (02/24 1119)    Weight change: Filed Weights   02/03/18 1119  Weight: 53 kg (116 lb 13.5 oz)    Intake/Output:  No  intake or output data in the 24 hours ending 02/03/18 1219    Physical Exam    General:  Intubated/sedated on cath lab table HEENT: normal Neck: supple. JVP to jaw. Carotids 2+ bilat; no bruits. No lymphadenopathy or thyromegaly appreciated. Cor: PMI nondisplaced. Tachy regular +s3 Lungs: + crackles  Abdomen: obese soft, nontender, nondistended. No hepatosplenomegaly. No bruits or masses. Good bowel sounds. Extremities: cool no cyanosis, clubbing, rash, + edema  RFA sheath. RFV swan LFA IABP Neuro: intubated sedated   Telemetry   Sinus tach with LBBB 150 (versus AFL)  Personally reviewed   EKG    S tach 130 with LBBB  Personally reviewed   Labs   Basic Metabolic Panel: Recent Labs  Lab 02/03/18 0853  NA 135  K 4.0  CL 99*  CO2 17*  GLUCOSE 344*  BUN 23*  CREATININE 1.17*  CALCIUM 10.3    Liver Function Tests: Recent Labs  Lab 02/03/18 0853  AST <5*  ALT 55*  ALKPHOS 76  BILITOT 1.2  PROT 8.2*  ALBUMIN 4.5   Recent Labs  Lab 02/03/18 0853  LIPASE 34   No results for input(s): AMMONIA in the last 168 hours.  CBC: Recent Labs  Lab 02/03/18 0853  WBC 11.7*  NEUTROABS 9.8*  HGB 18.1*  HCT 53.9*  MCV 101.1*  PLT 204    Cardiac Enzymes: No results for input(s): CKTOTAL, CKMB, CKMBINDEX, TROPONINI in the last 168 hours.  BNP: BNP (last 3 results) Recent Labs    02/03/18 0853  BNP 3,127.4*    ProBNP (last 3  results) No results for input(s): PROBNP in the last 8760 hours.   CBG: No results for input(s): GLUCAP in the last 168 hours.  Coagulation Studies: No results for input(s): LABPROT, INR in the last 72 hours.   Imaging   Dg Chest 2 View  Result Date: 02/03/2018 CLINICAL DATA:  Epigastric pain.  Cough. EXAM: CHEST  2 VIEW COMPARISON:  October 14, 2017 FINDINGS: Elevation of the right hemidiaphragm. The heart, hila, and mediastinum are stable. No pneumothorax. No nodules or masses. No focal infiltrates identified. The left upper chest is obscured by an overlapping device. IMPRESSION: No active cardiopulmonary disease. Electronically Signed   By: Dorise Bullion III M.D   On: 02/03/2018 09:35      Medications:     Current Medications: . heparin-papaverine-plasmalyte irrigation   Irrigation To OR  . [MAR Hold] heparin  3,200 Units Intravenous Once  . magnesium sulfate  40 mEq Other To OR  . potassium chloride  80 mEq Other To OR  . tranexamic acid  15 mg/kg Intravenous To OR  . tranexamic acid  2 mg/kg Intracatheter To OR     Infusions: . bivalirudin (ANGIOMAX) infusion 5 mg/mL Stopped (02/03/18 1147)  . cefUROXime (ZINACEF)  IV    . cefUROXime (ZINACEF)  IV    . dexmedetomidine    . DOPamine    . epinephrine    . fentaNYL infusion INTRAVENOUS    . heparin 30,000 units/NS 1000 mL solution for CELLSAVER    . heparin    . insulin (NOVOLIN-R) infusion    . midazolam (VERSED) infusion    . milrinone    . milrinone 0.25 mcg/kg/min (02/03/18 1148)  . nitroGLYCERIN    . norepinephrine (LEVOPHED) 4mg  / 239mL infusion 15 mcg/min (02/03/18 1135)  . phenylephrine 20mg /218mL NS (0.08mg /ml) infusion    . [MAR Hold] piperacillin-tazobactam    . [MAR Hold] sodium chloride    .  tranexamic acid (CYKLOKAPRON) infusion ([facility])    . vancomycin    . [MAR Hold] vancomycin         Patient Profile   56 y/o as above with NSTEMI and cardiogenic shock   Assessment/Plan   1.  NSTEMI/STEMI - New LBBB on ECG - cath with severe 2v CAD as above with likely severe LV dysfunction/ICM - not amenable to PCI - for emergent CABG with Dr. Roxy Manns (discussed personally with him and Dr. Martinique in cath lab)  2. Acute systolic HF -> cardiogenic shock in setting of #1 - she is critically ill. Hemodynamics improved with IABP, NE and milrinone. - for Emergent CABG - TEE in OR  3. Acute respiratory failure d/t pulmonary edema - now intubated.  - start IV lasix  4. Tachycardia - initial ECG looks like sinus tach with new LBBB - HR now 150 may be AFL. Will follow  5. PAF - was not on [facility] previously. Will address post-op  6. H/o SCLC - s/p treatment 2015. Lost to f/u since 04/2015. Will need f/u with oncology (Dr. Julien Nordmann)  [place] Performed by: Glori Bickers  Total critical care time: 45 minutes  Critical care time was exclusive of separately billable procedures and treating other patients.  Critical care was necessary to treat or prevent imminent or life-threatening deterioration.  Critical care was time spent personally by me (independent of midlevel providers or residents) on the following activities: development of treatment plan with patient and/or surrogate as well as nursing, discussions with consultants, evaluation of patient's response to treatment, examination of patient, obtaining history from patient or surrogate, ordering and performing treatments and interventions, ordering and review of laboratory studies, ordering and review of radiographic studies, pulse oximetry and re-evaluation of patient's condition.   Length of Stay: 0  Glori Bickers, MD  02/03/2018, 12:19 PM  Advanced Heart Failure Team Pager [phone] (M-F; 7a - 4p)  Please contact [facility] for night-coverage after hours (4p -7a ) and weekends on [email or web address]

## 2018-02-03 NOTE — OR Nursing (Signed)
SICU 45 minute call @1720 .  SICU 20 minutes call @1751 

## 2018-02-04 ENCOUNTER — Encounter (HOSPITAL_COMMUNITY): Payer: Self-pay | Admitting: Cardiology

## 2018-02-04 ENCOUNTER — Other Ambulatory Visit: Payer: Self-pay

## 2018-02-04 ENCOUNTER — Inpatient Hospital Stay (HOSPITAL_COMMUNITY): Payer: BLUE CROSS/BLUE SHIELD

## 2018-02-04 DIAGNOSIS — I5021 Acute systolic (congestive) heart failure: Secondary | ICD-10-CM

## 2018-02-04 DIAGNOSIS — Z95811 Presence of heart assist device: Secondary | ICD-10-CM

## 2018-02-04 LAB — TYPE AND SCREEN
ABO/RH(D): A POS
Antibody Screen: NEGATIVE
Unit division: 0
Unit division: 0

## 2018-02-04 LAB — POCT I-STAT 3, ART BLOOD GAS (G3+)
ACID-BASE DEFICIT: 8 mmol/L — AB (ref 0.0–2.0)
ACID-BASE DEFICIT: 10 mmol/L — AB (ref 0.0–2.0)
Acid-base deficit: 1 mmol/L (ref 0.0–2.0)
BICARBONATE: 23.6 mmol/L (ref 20.0–28.0)
Bicarbonate: 15.2 mmol/L — ABNORMAL LOW (ref 20.0–28.0)
Bicarbonate: 18.4 mmol/L — ABNORMAL LOW (ref 20.0–28.0)
Bicarbonate: 24.6 mmol/L (ref 20.0–28.0)
O2 SAT: 100 %
O2 SAT: 100 %
O2 Saturation: 95 %
O2 Saturation: 100 %
PCO2 ART: 36.7 mmHg (ref 32.0–48.0)
PCO2 ART: 40.2 mmHg (ref 32.0–48.0)
PCO2 ART: 52.6 mmHg — AB (ref 32.0–48.0)
PO2 ART: 232 mmHg — AB (ref 83.0–108.0)
PO2 ART: 76 mmHg — AB (ref 83.0–108.0)
Patient temperature: 37.5
TCO2: 16 mmol/L — ABNORMAL LOW (ref 22–32)
TCO2: 20 mmol/L — AB (ref 22–32)
TCO2: 25 mmol/L (ref 22–32)
TCO2: 26 mmol/L (ref 22–32)
pCO2 arterial: 27.1 mmHg — ABNORMAL LOW (ref 32.0–48.0)
pH, Arterial: 7.151 — CL (ref 7.350–7.450)
pH, Arterial: 7.358 (ref 7.350–7.450)
pH, Arterial: 7.393 (ref 7.350–7.450)
pH, Arterial: 7.418 (ref 7.350–7.450)
pO2, Arterial: 251 mmHg — ABNORMAL HIGH (ref 83.0–108.0)
pO2, Arterial: 180 mmHg — ABNORMAL HIGH (ref 83.0–108.0)

## 2018-02-04 LAB — CBC
HCT: 33.3 % — ABNORMAL LOW (ref 36.0–46.0)
HCT: 33.1 % — ABNORMAL LOW (ref 36.0–46.0)
HEMOGLOBIN: 11.1 g/dL — AB (ref 12.0–15.0)
Hemoglobin: 10.8 g/dL — ABNORMAL LOW (ref 12.0–15.0)
MCH: 31.2 pg (ref 26.0–34.0)
MCH: 30.4 pg (ref 26.0–34.0)
MCHC: 33.3 g/dL (ref 30.0–36.0)
MCHC: 32.6 g/dL (ref 30.0–36.0)
MCV: 93.5 fL (ref 78.0–100.0)
MCV: 93.2 fL (ref 78.0–100.0)
PLATELETS: 144 10*3/uL — AB (ref 150–400)
Platelets: 161 10*3/uL (ref 150–400)
RBC: 3.56 MIL/uL — ABNORMAL LOW (ref 3.87–5.11)
RBC: 3.55 MIL/uL — AB (ref 3.87–5.11)
RDW: 18.9 % — AB (ref 11.5–15.5)
RDW: 19.8 % — ABNORMAL HIGH (ref 11.5–15.5)
WBC: 7.2 10*3/uL (ref 4.0–10.5)
WBC: 9.6 10*3/uL (ref 4.0–10.5)

## 2018-02-04 LAB — PREPARE FRESH FROZEN PLASMA
UNIT DIVISION: 0
Unit division: 0

## 2018-02-04 LAB — BPAM FFP
Blood Product Expiration Date: 201902282359
Blood Product Expiration Date: 201902282359
ISSUE DATE / TIME: 201902242047
ISSUE DATE / TIME: 201902242047
UNIT TYPE AND RH: 600
Unit Type and Rh: 6200

## 2018-02-04 LAB — PREPARE PLATELET PHERESIS
UNIT DIVISION: 0
Unit division: 0

## 2018-02-04 LAB — POCT I-STAT 3, VENOUS BLOOD GAS (G3P V)
Acid-base deficit: 7 mmol/L — ABNORMAL HIGH (ref 0.0–2.0)
Bicarbonate: 21.9 mmol/L (ref 20.0–28.0)
O2 Saturation: 59 %
PH VEN: 7.201 — AB (ref 7.250–7.430)
TCO2: 24 mmol/L (ref 22–32)
pCO2, Ven: 55.8 mmHg (ref 44.0–60.0)
pO2, Ven: 38 mmHg (ref 32.0–45.0)

## 2018-02-04 LAB — BPAM RBC
BLOOD PRODUCT EXPIRATION DATE: 201903092359
Blood Product Expiration Date: 201903092359
ISSUE DATE / TIME: 201902242044
ISSUE DATE / TIME: 201902242044
UNIT TYPE AND RH: 6200
Unit Type and Rh: 6200

## 2018-02-04 LAB — BPAM PLATELET PHERESIS
BLOOD PRODUCT EXPIRATION DATE: 201902252359
Blood Product Expiration Date: 201902252359
ISSUE DATE / TIME: 201902241802
ISSUE DATE / TIME: 201902241802
UNIT TYPE AND RH: 5100
Unit Type and Rh: 6200

## 2018-02-04 LAB — MAGNESIUM
MAGNESIUM: 3.1 mg/dL — AB (ref 1.7–2.4)
MAGNESIUM: 2.9 mg/dL — AB (ref 1.7–2.4)

## 2018-02-04 LAB — GLUCOSE, CAPILLARY
GLUCOSE-CAPILLARY: 104 mg/dL — AB (ref 65–99)
GLUCOSE-CAPILLARY: 110 mg/dL — AB (ref 65–99)
GLUCOSE-CAPILLARY: 116 mg/dL — AB (ref 65–99)
GLUCOSE-CAPILLARY: 127 mg/dL — AB (ref 65–99)
GLUCOSE-CAPILLARY: 88 mg/dL (ref 65–99)
Glucose-Capillary: 134 mg/dL — ABNORMAL HIGH (ref 65–99)
Glucose-Capillary: 125 mg/dL — ABNORMAL HIGH (ref 65–99)
Glucose-Capillary: 121 mg/dL — ABNORMAL HIGH (ref 65–99)
Glucose-Capillary: 108 mg/dL — ABNORMAL HIGH (ref 65–99)
Glucose-Capillary: 138 mg/dL — ABNORMAL HIGH (ref 65–99)
Glucose-Capillary: 124 mg/dL — ABNORMAL HIGH (ref 65–99)
Glucose-Capillary: 78 mg/dL (ref 65–99)
Glucose-Capillary: 92 mg/dL (ref 65–99)
Glucose-Capillary: 117 mg/dL — ABNORMAL HIGH (ref 65–99)
Glucose-Capillary: 171 mg/dL — ABNORMAL HIGH (ref 65–99)

## 2018-02-04 LAB — LACTIC ACID, PLASMA: LACTIC ACID, VENOUS: 2.2 mmol/L — AB (ref 0.5–1.9)

## 2018-02-04 LAB — POCT I-STAT, CHEM 8
BUN: 19 mg/dL (ref 6–20)
Calcium, Ion: 1.06 mmol/L — ABNORMAL LOW (ref 1.15–1.40)
Chloride: 107 mmol/L (ref 101–111)
Creatinine, Ser: 0.8 mg/dL (ref 0.44–1.00)
GLUCOSE: 178 mg/dL — AB (ref 65–99)
HEMATOCRIT: 32 % — AB (ref 36.0–46.0)
HEMOGLOBIN: 10.9 g/dL — AB (ref 12.0–15.0)
Potassium: 4 mmol/L (ref 3.5–5.1)
Sodium: 143 mmol/L (ref 135–145)
TCO2: 23 mmol/L (ref 22–32)

## 2018-02-04 LAB — BASIC METABOLIC PANEL
Anion gap: 8 (ref 5–15)
BUN: 17 mg/dL (ref 6–20)
CALCIUM: 7.4 mg/dL — AB (ref 8.9–10.3)
CHLORIDE: 113 mmol/L — AB (ref 101–111)
CO2: 22 mmol/L (ref 22–32)
Creatinine, Ser: 0.92 mg/dL (ref 0.44–1.00)
GFR calc Af Amer: >60 mL/min (ref >60)
GFR calc non Af Amer: >60 mL/min (ref >60)
Glucose, Bld: 122 mg/dL — ABNORMAL HIGH (ref 65–99)
Potassium: 3.7 mmol/L (ref 3.5–5.1)
SODIUM: 143 mmol/L (ref 135–145)

## 2018-02-04 LAB — POCT ACTIVATED CLOTTING TIME
ACTIVATED CLOTTING TIME: 488 s
ACTIVATED CLOTTING TIME: 103 s
ACTIVATED CLOTTING TIME: 131 s
Activated Clotting Time: 103 seconds
Activated Clotting Time: 136 seconds
Activated Clotting Time: 92 seconds
Activated Clotting Time: 98 seconds
Activated Clotting Time: 114 seconds
Activated Clotting Time: 125 seconds

## 2018-02-04 LAB — COOXEMETRY PANEL
CARBOXYHEMOGLOBIN: 1.4 % (ref 0.5–1.5)
Carboxyhemoglobin: 1.4 % (ref 0.5–1.5)
METHEMOGLOBIN: 1.8 % — AB (ref 0.0–1.5)
Methemoglobin: 1.3 % (ref 0.0–1.5)
O2 SAT: 65.3 %
O2 Saturation: 99.4 %
TOTAL HEMOGLOBIN: 10.3 g/dL — AB (ref 12.0–16.0)
TOTAL HEMOGLOBIN: 11.5 g/dL — AB (ref 12.0–16.0)

## 2018-02-04 LAB — TROPONIN I

## 2018-02-04 LAB — APTT: aPTT: 30 seconds (ref 24–36)

## 2018-02-04 LAB — HIV ANTIBODY (ROUTINE TESTING W REFLEX): HIV SCREEN 4TH GENERATION: NONREACTIVE

## 2018-02-04 LAB — CREATININE, SERUM: Creatinine, Ser: 0.97 mg/dL (ref 0.44–1.00)

## 2018-02-04 LAB — LACTATE DEHYDROGENASE: LDH: 951 U/L — ABNORMAL HIGH (ref 98–192)

## 2018-02-04 LAB — PROTIME-INR
INR: 1.7
PROTHROMBIN TIME: 19.8 s — AB (ref 11.4–15.2)

## 2018-02-04 LAB — FIBRINOGEN: FIBRINOGEN: 379 mg/dL (ref 210–475)

## 2018-02-04 MED ORDER — SODIUM CHLORIDE 0.9 % IV SOLN: INTRAVENOUS | Status: DC | PRN

## 2018-02-04 MED ORDER — AMIODARONE HCL IN DEXTROSE 360-4.14 MG/200ML-% IV SOLN
30.0000 mg/h | INTRAVENOUS | Status: DC
  Administered 2018-02-05 – 2018-02-08 (×9): 30 mg/h via INTRAVENOUS
  Filled 2018-02-04 (×10): qty 200

## 2018-02-04 MED ORDER — AMIODARONE LOAD VIA INFUSION
150.0000 mg | Freq: Once | INTRAVENOUS | Status: AC
  Administered 2018-02-04: 150 mg via INTRAVENOUS

## 2018-02-04 MED ORDER — SODIUM CHLORIDE 0.9% FLUSH: 10.0000 mL | INTRAVENOUS | Status: DC | PRN

## 2018-02-04 MED ORDER — SODIUM CHLORIDE 0.9% FLUSH
10.0000 mL | Freq: Two times a day (BID) | INTRAVENOUS | Status: DC
  Administered 2018-02-04 – 2018-02-05 (×2): 10 mL
  Administered 2018-02-06: 30 mL
  Administered 2018-02-06 – 2018-02-07 (×2): 10 mL
  Administered 2018-02-07: 30 mL

## 2018-02-04 MED ORDER — DEXMEDETOMIDINE HCL IN NACL 200 MCG/50ML IV SOLN
0.0000 ug/kg/h | INTRAVENOUS | Status: DC
  Administered 2018-02-04 – 2018-02-05 (×4): 0.7 ug/kg/h via INTRAVENOUS
  Administered 2018-02-05 (×2): 0.5 ug/kg/h via INTRAVENOUS
  Administered 2018-02-05 (×2): 0.7 ug/kg/h via INTRAVENOUS
  Administered 2018-02-06: 0.4 ug/kg/h via INTRAVENOUS
  Administered 2018-02-06: 0.5 ug/kg/h via INTRAVENOUS
  Administered 2018-02-06: 0.4 ug/kg/h via INTRAVENOUS
  Administered 2018-02-07 – 2018-02-08 (×4): 0.5 ug/kg/h via INTRAVENOUS
  Filled 2018-02-04 (×8): qty 50
  Filled 2018-02-04: qty 100
  Filled 2018-02-04 (×2): qty 50

## 2018-02-04 MED ORDER — FUROSEMIDE 10 MG/ML IJ SOLN
4.0000 mg/h | INTRAVENOUS | Status: DC
  Administered 2018-02-04: 8 mg/h via INTRAVENOUS
  Administered 2018-02-05 – 2018-02-06 (×2): 4 mg/h via INTRAVENOUS
  Filled 2018-02-04: qty 5
  Filled 2018-02-04 (×2): qty 20
  Filled 2018-02-04: qty 25

## 2018-02-04 MED ORDER — DEXTROSE 5 % SOLN FOR IMPELLA PURGE CATHETER
INTRAVENOUS | Status: DC
  Filled 2018-02-04 (×2): qty 1000

## 2018-02-04 MED ORDER — POTASSIUM CHLORIDE 10 MEQ/50ML IV SOLN
10.0000 meq | INTRAVENOUS | Status: AC
  Administered 2018-02-04 (×3): 10 meq via INTRAVENOUS
  Filled 2018-02-04 (×3): qty 50

## 2018-02-04 MED ORDER — HEPARIN (PORCINE) IN NACL 100-0.45 UNIT/ML-% IJ SOLN
200.0000 [IU]/h | INTRAMUSCULAR | Status: DC
  Administered 2018-02-04: 200 [IU]/h via INTRAVENOUS
  Administered 2018-02-04: 150 [IU]/h via INTRAVENOUS
  Administered 2018-02-06: 300 [IU]/h via INTRAVENOUS
  Filled 2018-02-04 (×2): qty 250

## 2018-02-04 MED ORDER — AMIODARONE HCL IN DEXTROSE 360-4.14 MG/200ML-% IV SOLN
60.0000 mg/h | INTRAVENOUS | Status: AC
  Administered 2018-02-04: 60 mg/h via INTRAVENOUS

## 2018-02-04 MED ORDER — HEPARIN SODIUM (PORCINE) 5000 UNIT/ML IJ SOLN
50000.0000 [IU] | INTRAMUSCULAR | Status: DC
  Administered 2018-02-04 – 2018-02-07 (×4): 50000 [IU]
  Filled 2018-02-04 (×5): qty 10

## 2018-02-04 MED ORDER — CHLORHEXIDINE GLUCONATE CLOTH 2 % EX PADS
6.0000 | MEDICATED_PAD | Freq: Every day | CUTANEOUS | Status: DC
  Administered 2018-02-04 – 2018-02-08 (×5): 6 via TOPICAL

## 2018-02-04 MED ORDER — INSULIN ASPART 100 UNIT/ML ~~LOC~~ SOLN
0.0000 [IU] | SUBCUTANEOUS | Status: DC
  Administered 2018-02-04: 3 [IU] via SUBCUTANEOUS
  Administered 2018-02-04: 4 [IU] via SUBCUTANEOUS
  Administered 2018-02-05: 2 [IU] via SUBCUTANEOUS
  Administered 2018-02-05: 4 [IU] via SUBCUTANEOUS
  Administered 2018-02-05: 2 [IU] via SUBCUTANEOUS
  Administered 2018-02-05: 4 [IU] via SUBCUTANEOUS
  Administered 2018-02-06 – 2018-02-08 (×12): 2 [IU] via SUBCUTANEOUS

## 2018-02-04 MED ORDER — SODIUM CHLORIDE 0.9 % IV SOLN
Freq: Once | INTRAVENOUS | Status: AC
  Administered 2018-02-04: 12:00:00 via INTRAVENOUS

## 2018-02-04 MED ORDER — AMIODARONE HCL IN DEXTROSE 360-4.14 MG/200ML-% IV SOLN
INTRAVENOUS | Status: AC
  Filled 2018-02-04: qty 200

## 2018-02-04 MED ORDER — PANTOPRAZOLE SODIUM 40 MG IV SOLR
40.0000 mg | INTRAVENOUS | Status: DC
  Administered 2018-02-04 – 2018-02-07 (×4): 40 mg via INTRAVENOUS
  Filled 2018-02-04 (×5): qty 40

## 2018-02-04 MED FILL — Potassium Chloride Inj 2 mEq/ML: INTRAVENOUS | Qty: 40 | Status: AC

## 2018-02-04 MED FILL — Dexmedetomidine HCl in NaCl 0.9% IV Soln 400 MCG/100ML: INTRAVENOUS | Qty: 100 | Status: AC

## 2018-02-04 MED FILL — Nitroglycerin IV Soln 100 MCG/ML in D5W: INTRA_ARTERIAL | Qty: 10 | Status: AC

## 2018-02-04 MED FILL — Magnesium Sulfate Inj 50%: INTRAMUSCULAR | Qty: 10 | Status: AC

## 2018-02-04 MED FILL — Heparin Sodium (Porcine) Inj 1000 Unit/ML: INTRAMUSCULAR | Qty: 30 | Status: AC

## 2018-02-04 NOTE — Progress Notes (Signed)
[facility] Progress Note Patient Name: Brittany [name] DOB: 18-Jun-1961 MRN: [id number]   Date of Service  02/04/2018  HPI/Events of Note  Troponin > 65 in setting of acute STEMI complicated by cardiogenic shock requiring IABP, emergent CABG and Impella.   eICU Interventions  Continue management per Cardiology and CVTS.     Intervention Category Intermediate Interventions: Diagnostic test evaluation  Sommer,Steven Cornelia Copa 02/04/2018, 12:18 AM

## 2018-02-04 NOTE — Progress Notes (Signed)
Initial Nutrition Assessment  DOCUMENTATION CODES:   Not applicable  INTERVENTION:   If unable to extubate and medically appropriate, recommend initiation of Vital AF 1.2 at 50 mL/hr (1200 mL daily) tube feeding  TF regimen will provide 1440 kcal, 90 grams of protein, and 972 ml of H2O.   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 Pt required emergent CABG 02/03/2018 with Impella placement. Pt remains intubated post-operatively.    Discussed pt with RN. No Propofol at this time. Pt with NGT for low intermittent suction at stomach. Pt with bilateral chest tubes, output yesterday 390 mL. Weight records indicate pt up 13 lbs from admission weight. Per chart, pt net positive 6.5 L since admission. Per chart, IABP removed this AM.  Patient is currently intubated on ventilator support. MV: 9.0 L/min Temp (24hrs), Avg:98.6 F (37 C), Min:94.6 F (34.8 C), Max:99.9 F (37.7 C)  Labs reviewed; CBG 78-124, Magnesium 3.1 Medications reviewed; Colace, Protonix, sliding scale insulin, insulin drip d/c at 0800 today, epinephrine and norepinephrine (weaned gradually), potassium chloride    NUTRITION - FOCUSED PHYSICAL EXAM:    Most Recent Value  Orbital Region  No depletion  Upper Arm Region  No depletion  Thoracic and Lumbar Region  No depletion  Buccal Region  Unable to assess  Temple Region  Unable to assess  Clavicle Bone Region  Unable to assess  Clavicle and Acromion Bone Region  No depletion  Scapular Bone Region  Unable to assess  Dorsal Hand  No depletion  Patellar Region  Mild depletion   Anterior Thigh Region  Moderate depletion  Posterior Calf Region  Mild depletion  Edema (RD Assessment)  Mild      Diet Order:  Diet NPO time specified  EDUCATION NEEDS:   Not appropriate for education at this time  Skin:  Skin Assessment: Skin Integrity Issues: Skin Integrity Issues:: Incisions Incisions: chest and R leg  Last BM:  Unknown BM date  Height:   Ht Readings from Last 1 Encounters:  02/03/18 5\' 1"  (1.549 m)   Weight:   Wt Readings from Last 1 Encounters:  02/04/18 129 lb 10.1 oz (58.8 kg)   Ideal Body Weight:  47.7 kg  BMI:  Body mass index is 24.49 kg/m.  Estimated Nutritional Needs:   Kcal:  1432  Protein:  75-90 grams  Fluid:  >/= 1.5 L/d  Parks Ranger, MS, RDN, LDN 02/04/2018 12:58 PM

## 2018-02-04 NOTE — Progress Notes (Addendum)
Advanced Heart Failure Rounding Note  PCP-Cardiologist: No primary care provider on file.   Subjective:    POD #1 emergent CABG  Coox 65.3% this am on Epi 1, Norepi 1, and milrinone 0.5 mcg/kg/min.  Creatinine stable at 0.92.  Intubated and sedated. Intermittently opens eyes  Weight up 13 lbs pre-op.   IABP removed this am. Remains on Impella at p-8. No alarms. Flow 4.3 L/min  Swan Numbers 0600 CVP not connected (no open slot at this time) PAP 21/15 (18) CO 2.4 CI 1.6   TEE 02/03/18 LVEF 20%, Mild to Mod AR, Mild/Mod MR, RV normal.   Objective:   Weight Range: 129 lb 10.1 oz (58.8 kg) Body mass index is 24.49 kg/m.   Vital Signs:   Temp:  [94.6 F (34.8 C)-99.9 F (37.7 C)] 99 F (37.2 C) (02/25 0645) Pulse Rate:  [37-268] 114 (02/25 0319) Resp:  [10-36] 19 (02/25 0645) BP: (91-149)/(38-106) 106/72 (02/25 0630) SpO2:  [91 %-100 %] 100 % (02/25 0645) Arterial Line BP: (129)/(46) 129/46 (02/25 0645) FiO2 (%):  [60 %-100 %] 60 % (02/25 0319) Weight:  [116 lb 13.5 oz (53 kg)-129 lb 10.1 oz (58.8 kg)] 129 lb 10.1 oz (58.8 kg) (02/25 0400) Last BM Date: (pta)  Weight change: Filed Weights   02/03/18 1119 02/04/18 0400  Weight: 116 lb 13.5 oz (53 kg) 129 lb 10.1 oz (58.8 kg)    Intake/Output:   Intake/Output Summary (Last 24 hours) at 02/04/2018 0721 Last data filed at 02/04/2018 0500 Gross per 24 hour  Intake 9522.98 ml  Output 3936.6 ml  Net 5586.38 ml      Physical Exam    General: Intubated/sedated.  HEENT: + ETT Neck: Supple. JVP difficult, but appears at least 9-10 cm. Carotids 2+ bilat; no bruits. No thyromegaly or nodule noted. Cor: Sternal dressing intact PMI nondisplaced. RRR, + Impella sounds. Intrinsic heart sounds difficult to hear underneath. + Chest tubes Lungs: Diminished basilar sounds Abdomen: Soft, non-tender, non-distended, no HSM. No bruits or masses. +BS  Extremities: No cyanosis, clubbing, or rash. Trace ankle edema. L groin  impella site stable.  Neuro: Intubated/sedated  GU: Foley with yellow/orange urine.   Telemetry   Sinus Tach at 110-120s, occasional PVCs, sparse runs of NSVT 3-4 beats. Personally reviewed.  EKG    02/04/18 Sinus Tach 115 bpm, personally reviewed  Labs    CBC Recent Labs    02/03/18 0853  02/03/18 1900 02/04/18 0333  WBC 11.7*  --  9.0 7.2  NEUTROABS 9.8*  --   --   --   HGB 18.1*   < > 8.4* 11.1*  HCT 53.9*   < > 25.6* 33.3*  MCV 101.1*  --  101.6* 93.5  PLT 204   < > 189 161   < > = values in this interval not displayed.   Basic Metabolic Panel Recent Labs    02/03/18 2219 02/04/18 0333  NA 144 143  K 4.1 3.7  CL 111 113*  CO2 22 22  GLUCOSE 117* 122*  BUN 15 17  CREATININE 0.97 0.92  CALCIUM 7.1* 7.4*  MG  --  3.1*   Liver Function Tests Recent Labs    02/03/18 0853  AST <5*  ALT 55*  ALKPHOS 76  BILITOT 1.2  PROT 8.2*  ALBUMIN 4.5   Recent Labs    02/03/18 0853  LIPASE 34   Cardiac Enzymes Recent Labs    02/03/18 2219 02/04/18 0333  TROPONINI >65.00* >  65.00*    BNP: BNP (last 3 results) Recent Labs    02/03/18 0853  BNP 3,127.4*    ProBNP (last 3 results) No results for input(s): PROBNP in the last 8760 hours.   D-Dimer Recent Labs    02/03/18 0853  DDIMER 1.07*   Hemoglobin A1C No results for input(s): HGBA1C in the last 72 hours. Fasting Lipid Panel No results for input(s): CHOL, HDL, LDLCALC, TRIG, CHOLHDL, LDLDIRECT in the last 72 hours. Thyroid Function Tests No results for input(s): TSH, T4TOTAL, T3FREE, THYROIDAB in the last 72 hours.  Invalid input(s): FREET3  Other results:   Imaging    Dg Chest 2 View  Result Date: 02/03/2018 CLINICAL DATA:  Epigastric pain.  Cough. EXAM: CHEST  2 VIEW COMPARISON:  October 14, 2017 FINDINGS: Elevation of the right hemidiaphragm. The heart, hila, and mediastinum are stable. No pneumothorax. No nodules or masses. No focal infiltrates identified. The left upper chest is  obscured by an overlapping device. IMPRESSION: No active cardiopulmonary disease. Electronically Signed   By: Dorise Bullion III M.D   On: 02/03/2018 09:35   Dg Chest Port 1 View  Result Date: 02/03/2018 CLINICAL DATA:  Post CABG EXAM: PORTABLE CHEST 1 VIEW COMPARISON:  02/03/2018 FINDINGS: Endotracheal tube is 2 cm above the carina. Swan-Ganz catheter tip is in the central right pulmonary artery. NG tube enters the stomach. Bilateral chest tubes in place. No pneumothorax. Heart is borderline in size. Mild perihilar atelectasis or edema. No effusions. IMPRESSION: Postoperative changes from CABG.  No pneumothorax. Bilateral perihilar atelectasis or early edema. Electronically Signed   By: Rolm Baptise M.D.   On: 02/03/2018 19:23      Medications:     Scheduled Medications: . aspirin EC  325 mg Oral Daily   Or  . aspirin  324 mg Per Tube Daily  . bisacodyl  10 mg Rectal Daily  . chlorhexidine gluconate (MEDLINE KIT)  15 mL Mouth Rinse BID  . docusate  100 mg Per Tube BID  . famotidine (PEPCID) IV  20 mg Intravenous Q12H  . insulin regular  0-10 Units Intravenous TID WC  . ipratropium-albuterol  3 mL Nebulization Q4H  . mouth rinse  15 mL Mouth Rinse QID  . pantoprazole (PROTONIX) IV  40 mg Intravenous Q24H  . sodium chloride flush  3 mL Intravenous Q12H     Infusions: . sodium chloride 10 mL/hr at 02/04/18 0400  . sodium chloride    . sodium chloride    . sodium chloride    . sodium chloride    . sodium chloride    . sodium chloride    . albumin human    . cefUROXime (ZINACEF)  IV Stopped (02/03/18 2349)  . dexmedetomidine (PRECEDEX) IV infusion 0.7 mcg/kg/hr (02/04/18 0601)  . dextrose 5 % Impella 5.0 Purge solution    . EPINEPHrine 4 mg in dextrose 5% 250 mL infusion (16 mcg/mL) 1 mcg/min (02/04/18 0400)  . fentaNYL infusion INTRAVENOUS 50 mcg/hr (02/04/18 0412)  . insulin (NOVOLIN-R) infusion 2.6 mL/hr at 02/04/18 0400  . lactated ringers    . lactated ringers    .  lactated ringers    . milrinone 0.5 mcg/kg/min (02/04/18 0400)  . nitroGLYCERIN    . norepinephrine (LEVOPHED) Adult infusion 2 mcg/min (02/04/18 0400)  . phenylephrine (NEO-SYNEPHRINE) Adult infusion    . potassium chloride 10 mEq (02/04/18 0630)     PRN Medications:  sodium chloride, sodium chloride, Place/Maintain arterial line **AND** sodium chloride, Place/Maintain arterial  line **AND** sodium chloride, acetaminophen **OR** acetaminophen (TYLENOL) oral liquid 160 mg/5 mL, albumin human, fentaNYL, lactated ringers, metoprolol tartrate, midazolam, ondansetron (ZOFRAN) IV, sodium chloride flush  Patient Profile   Brittany [name] is a 57 y/o woman with a history of PAF, preveious smoker quit 4 years ago, hypertension, 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 She was admitted with NSTEMI and shock.  Emergent cath 02/03/18 showed LAD 100% stenosed, LCx 95% stenosed. Taken for emergent CABG 02/03/18  Assessment/Plan   1. NSTEMI/STEMI s/p Emergent CABG 02/03/18 - New LBBB on ECG - cath with severe 2v CAD as above with likely severe LV dysfunction/ICM - s/p Emergent CABG 02/03/18 - Chest tubes continue to drain.    2. Acute systolic HF -> cardiogenic shock in setting of #1 - TEE 02/03/18 LVEF 20%, Mild to Mod AR, Mild/Mod MR, RV normal.  - Coox 65.3% this am on Epi 1, Norepi 1, and milrinone 0.5 mcg/kg/min.  Creatinine stable at 0.92. - Volume status at least mildly elevated post op.  ART line with SBP in 120s on minimal pressors. Wean as tolerated.  - No lasix for now with weaning of pressors.  - No BB yet with cardiogenic shock.   3. Acute respiratory failure d/t pulmonary edema - Intubated and sedated.   4. Tachycardia - Initial ECG looks like sinus tach with new LBBB - Remains in Sinus tach today. Will continue to follow as pressors weaned. No BB yet with cardiogenic shock.  -  Goal K > 4.0 and Mg > 2.0.   5. PAF - Previously not on AC. Will address post-op.  - CHA2DS2/VASc is at least 4.  (CHF, Vasc disease, HTN, Female)   6. H/o SCLC - s/p treatment 2015. Lost to f/u since 04/2015. Will need f/u with oncology (Dr. Julien Nordmann) as outpatient.   Medication concerns reviewed with patient and pharmacy team. Barriers identified: None at this time.   Length of Stay: 1  Annamaria Helling  02/04/2018, 7:21 AM  Advanced Heart Failure Team Pager [phone] (M-F; 7a - 4p)  Please contact [facility] for night-coverage after hours (4p -7a ) and weekends on [email or web address]    Agree.  She is s/p emergent CABG and Impella placement for NSTEMI/STEMI and profound cardiogenic shock. Intermittently awake on vent. Moves all 4. Flows on Impella stable. Hemodynamics improved but remain marginal. IABP out this am. EF 10% on intra-op TEE.   On exam Intubated/sedated RIJ swan + axillary impella Sternal dressing intact. Impella hum  Lungs coarse anteriorly Ab soft NT quiet Ext  Mild edema  She remains tenuous but improving. Impella flows and waveforms are stable. Will begin gentle diuresis.  Will get f/u echo. D/w Dr. Roxy Manns.  CRITICAL CARE Performed by: Glori Bickers  Total critical care time: 45 minutes  Critical care time was exclusive of separately billable procedures and treating other patients.  Critical care was necessary to treat or prevent imminent or life-threatening deterioration.  Critical care was time spent personally by me (independent of midlevel providers or residents) on the following activities: development of treatment plan with patient and/or surrogate as well as nursing, discussions with consultants, evaluation of patient's response to treatment, examination of patient, obtaining history from patient or surrogate, ordering and performing treatments and interventions, ordering and review of laboratory studies, ordering and review of  radiographic studies, pulse oximetry and re-evaluation of patient's condition.  Glori Bickers, MD  11:23  AM

## 2018-02-04 NOTE — Progress Notes (Signed)
      [address]       [place] [address]             [phone]      S/p emergency CABG x 3, Impella placement  BP (!) 126/42   Pulse (!) 117   Temp (!) 100.8 F (38.2 C)   Resp 20   Ht 5\' 1"  (1.549 m)   Wt 129 lb 10.1 oz (58.8 kg)   SpO2 100%   BMI 24.49 kg/m  PA= 40/25, CI = 2.5 On milrinone, levophed, epi, amiodarone drips Lasix drip started  PRVC 20/ 40%/ 5 PEEP  Creatinine 0.8, Hct= 32  Remains relatively stable hemodynamically with support  Remo Lipps C. Roxan Hockey, MD [facility] [phone]

## 2018-02-04 NOTE — Progress Notes (Signed)
93fr sheath present in right radial. Unable to aspirate, sheath removed and hemostasis band applied at 14cc at 07:30.  IABP removed from left femoral artery, manual pressure applied for 30 minutes. Site level 0, no s+s of hematoma, tegaderm dressing applied.    66fr venous sheath aspirated and removed from rfv. Manual pressure applied for 10 minutes.  3cc air removed from right radial hemostasis band, then 54fr aterial sheath removed, Manual pressure applied to both sites for additional 20 minutes. No s+s of hematoma, groin level 0. tegaderm dressing applied.  Doppler pulse check attempted, but patient has no pulsitile pressure due to being on Impella. Color good and equal in both feet.  Bedrest begins at 08:45:00

## 2018-02-04 NOTE — Plan of Care (Signed)
Dr. [name] here, pt having frequent runs of VT with suction events, impella to P6, orders to put epi to 76mcg/kg/min and levophed up to 10 mcg/kg/min. .Aiodarone bolus and drip started, portable echo by Dr. [name], placement of impella fine. Dr. Roxy Manns aware of VT, orders received.

## 2018-02-04 NOTE — Progress Notes (Signed)
CRITICAL VALUE ALERT  Critical Value:  Troponin > 65  Date & Time Notied:  T-1 2350  Provider Notified: S. Sommers CCM MD  Orders Received/Actions taken: Not at this time

## 2018-02-04 NOTE — Progress Notes (Signed)
Pt noted to be spontaneously opening eyes.  Followed commands with weak bilateral HGTW.  Precedex was 0.5 at that time.  Upon awakening and after neuro assessment, Precedex increased to 0.7 and fentanyl gtts initiated.

## 2018-02-04 NOTE — Procedures (Signed)
Arterial Catheter Insertion Procedure Note Brittany [name] [id number] 1960-12-13  Procedure: Insertion of Arterial Catheter  Indications: Blood pressure monitoring  Procedure Details Consent: Unable to obtain consent because of emergent medical necessity. Time Out: Verified patient identification, verified procedure, site/side was marked, verified correct patient position, special equipment/implants available, medications/allergies/relevent history reviewed, required imaging and test results available.  Performed  Maximum sterile technique was used including antiseptics, cap, gloves, gown, hand hygiene, mask and sheet. Skin prep: Chlorhexidine; local anesthetic administered 22 gauge catheter was inserted into left radial artery using the Seldinger technique.  Evaluation Blood flow good; BP tracing good. Complications: No apparent complications.   Brittany [name] 02/04/2018

## 2018-02-04 NOTE — Progress Notes (Signed)
[address]       [place] [address]             [phone]        CARDIOTHORACIC SURGERY PROGRESS NOTE   R1 Day Post-Op Procedure(s) (LRB): INTRAOPERATIVE TRANSESOPHAGEAL ECHOCARDIOGRAM (N/A) CORONARY ARTERY BYPASS GRAFTING (CABG) (N/A) PLACEMENT OF IMPELLA LEFT VENTRICULAR ASSIST DEVICE LD  Subjective: Sedated on vent.  Reportedly has woken up and moves all 4 extremities  Objective: Vital signs: BP Readings from Last 1 Encounters:  02/04/18 (!) 126/42   Pulse Readings from Last 1 Encounters:  02/04/18 (!) 25   Resp Readings from Last 1 Encounters:  02/04/18 20   Temp Readings from Last 1 Encounters:  02/04/18 98.4 F (36.9 C)    Hemodynamics: PAP: (19-39)/(13-32) 39/32 CVP:  [12 mmHg] 12 mmHg CO:  [2.3 L/min-3.5 L/min] 2.4 L/min CI:  [1.4 L/min/m2-2.4 L/min/m2] 1.6 L/min/m2  Mixed venous co-ox 65%  Physical Exam:  Rhythm:   Sinus tach  Breath sounds: Coarse but clear  Heart sounds:  RRR  Incisions:  Dressings dry, intact  Abdomen:  Soft, non-distended, quiet  Extremities:  Warm, well-perfused  Chest tubes:  low volume thin serosanguinous output, no air leak    Intake/Output from previous day: 02/24 0701 - 02/25 0700 In: 9523 [I.V.:3732.6; Blood:3788.8; IV Piggyback:1900] Out: 3936.6 [Urine:2796.6; Blood:450; Chest Tube:690] Intake/Output this shift: Total I/O In: 389 [Blood:379; Other:10] Out: -   Lab Results:  CBC: Recent Labs    02/03/18 1900 02/04/18 0333  WBC 9.0 7.2  HGB 8.4* 11.1*  HCT 25.6* 33.3*  PLT 189 161    BMET:  Recent Labs    02/03/18 2219 02/04/18 0333  NA 144 143  K 4.1 3.7  CL 111 113*  CO2 22 22  GLUCOSE 117* 122*  BUN 15 17  CREATININE 0.97 0.92  CALCIUM 7.1* 7.4*     PT/INR:   Recent Labs    02/04/18 0333  LABPROT 19.8*  INR 1.70    CBG (last 3)  Recent Labs    02/04/18 0348 02/04/18 0510 02/04/18 0556  GLUCAP 124* 88 78    ABG    Component Value Date/Time   PHART 7.418 02/04/2018 0350   PCO2ART 36.7 02/04/2018 0350   PO2ART 180.0 (H) 02/04/2018 0350   HCO3 23.6 02/04/2018 0350   TCO2 25 02/04/2018 0350   ACIDBASEDEF 1.0 02/04/2018 0350   O2SAT 65.3 02/04/2018 0524    CXR: PORTABLE CHEST 1 VIEW  COMPARISON:  February 03, 2018  FINDINGS: Endotracheal tube tip is 3.0 cm above the carina. Swan-Ganz catheter tip is in the right main pulmonary artery. Nasogastric tube tip and side port are below the diaphragm. There are chest tubes bilaterally. There is also a mediastinal drain. No pneumothorax.  There is apparent radiation therapy change in the right perihilar region. There is postoperative change on the left. No edema or consolidation. Heart size normal. Pulmonary vascularity is distorted on the right appears normal on the left. External pacemaker noted.  IMPRESSION: Tube and catheter positions as described without pneumothorax. Postoperative change on the left. Presumed radiation therapy change right perihilar region. Stable cardiac silhouette.   Electronically Signed   By: Lowella Grip III M.D.   On: 02/04/2018 07:42   EKG: NSR w/out acute ischemic changes, previous anterolateral infarct   Assessment/Plan: S/P Procedure(s) (LRB): INTRAOPERATIVE TRANSESOPHAGEAL ECHOCARDIOGRAM (N/A) CORONARY ARTERY BYPASS GRAFTING (CABG) (N/A) PLACEMENT OF IMPELLA LEFT VENTRICULAR ASSIST DEVICE LD  Overall stable  POD1  CV:  Impella LVAD functioning well w/ flows 4.5-5.0 L/min  PA pressures and CVP relatively low, C.I. 1.6-2.0 and co-ox 65%  Milrinone @ 0.5 with low dose vasopressors:  Epi @ 1, Levophed @ 1  Looks like she may need some volume  Resp:  O2 sats 100% on FiO2 60% and CXR looks remarkably clear  Renal:  UOP 40-60 mL/hr last 4 hours  Creatinine 0.9  Probably needs some volume  Neuro:  Reportedly grossly intact when sedation turned down  Sedated on vent  Heme:  Expected post op acute blood loss  anemia, Hgb 11.1  Platelet count normal  Last INR 1.7  Chest tube output low, no signs of active bleeding  Plan:  Decrease Epi and watch HR, RV function  D/C IABP and radial artery sheath  Leave chest tubes in place for now  Start heparin in Impella purge at noon today if no signs of bleeding  Start heparin drip w/out bolus at 4pm today if no signs of bleeding  Wean FiO2 as tolerated, otherwise no vent weaning  Rexene Alberts, MD 02/04/2018 8:08 AM

## 2018-02-04 NOTE — Progress Notes (Addendum)
Name: Brittany [name] MRN: [id number] DOB: 1961/10/08    ADMISSION DATE:  02/03/2018 CONSULTATION DATE:  02/03/2018  REFERRING MD :  Dr. Roxy Manns   CHIEF COMPLAINT:  Cardiogenic Shock   HISTORY OF PRESENT ILLNESS:   57 year old female with PMH of Small Cell Lung CA s/p Chemo/Radiation 2016, PAF (Follwed by Dr. Marlou Porch), HTN, Asthma  Presents to ED with reported 2 days of dyspnea, nausea/vomiting, chest tightness, and epigastric pain. Upon arrival tachypneic and tachycardiac. Troponin >30, LA 7, EKG with new left bundle branch block, Code STEMI activated, taken to Cath Lab. In cath lab patient was found to have severe CAD with likely acute occlusion of the ostial left anterior descending coronary artery, while passing wire patient developed acute occlusion of the left circumflex coronary artery which was complicated by the development of cardiogenic shock. Intra-aortic balloon pump placed. Taken to OR for emergent CABG and Impella placement. Remains Intubated post-operatively. PCCM asked to consult.   SIGNIFICANT EVENTS  2/24 > Presents to ED   STUDIES:  CXR 2/24 > No acute  CXR 2/24 > Endotracheal tube is 2 cm above the carina. Swan-Ganz catheter tip is in the central right pulmonary artery. NG tube enters the stomach. Bilateral chest tubes in place. No pneumothorax. Heart is borderline in size. Mild perihilar atelectasis or edema. No effusions.  SUBJECTIVE:  IABP removed this am.  Remains on impella.  Having some ectopy.   VITAL SIGNS: Temp:  [94.6 F (34.8 C)-99.9 F (37.7 C)] 99.1 F (37.3 C) (02/25 1115) Pulse Rate:  [25-263] 110 (02/25 1111) Resp:  [12-32] 20 (02/25 1115) BP: (91-142)/(38-102) 126/42 (02/25 0729) SpO2:  [91 %-100 %] 100 % (02/25 1115) Arterial Line BP: (71-146)/(40-92) 88/84 (02/25 1115) FiO2 (%):  [40 %-100 %] 40 % (02/25 1111) Weight:  [58.8 kg (129 lb 10.1 oz)] 58.8 kg (129 lb 10.1 oz) (02/25 0400)  PHYSICAL EXAMINATION: General:  Adult female,  on vent, critically ill  Neuro:  Sedated, RASS-3, does not follow commands, pupils 2 mm and sluggish  HEENT:  ETT in place  Cardiovascular: sternal dressing, impella in place,  Tachy, ventricular ectopy  Lungs:  Mediastinal and Right Pleural chest tube in place, no wheeze, coarse breath sounds  Abdomen:  Non-distended, active bowel sounds  Musculoskeletal:  -edema  Skin:  Warm, dry   Recent Labs  Lab 02/03/18 0853  02/03/18 1713 02/03/18 1858 02/03/18 2219 02/04/18 0333  NA 135   < > 145 149* 144 143  K 4.0   < > 4.1 3.6 4.1 3.7  CL 99*   < > 110  --  111 113*  CO2 17*  --   --   --  22 22  BUN 23*   < > 20  --  15 17  CREATININE 1.17*   < > 0.80  --  0.97 0.92  GLUCOSE 344*   < > 226* 167* 117* 122*   < > = values in this interval not displayed.   Recent Labs  Lab 02/03/18 0853  02/03/18 1730 02/03/18 1858 02/03/18 1900 02/04/18 0333  HGB 18.1*   < > 10.4* 8.5* 8.4* 11.1*  HCT 53.9*   < > 31.9* 25.0* 25.6* 33.3*  WBC 11.7*  --   --   --  9.0 7.2  PLT 204   < > 65*  --  189 161   < > = values in this interval not displayed.   Dg Chest 2 View  Result Date: 02/03/2018  CLINICAL DATA:  Epigastric pain.  Cough. EXAM: CHEST  2 VIEW COMPARISON:  October 14, 2017 FINDINGS: Elevation of the right hemidiaphragm. The heart, hila, and mediastinum are stable. No pneumothorax. No nodules or masses. No focal infiltrates identified. The left upper chest is obscured by an overlapping device. IMPRESSION: No active cardiopulmonary disease. Electronically Signed   By: Dorise Bullion III M.D   On: 02/03/2018 09:35   Dg Chest Port 1 View  Result Date: 02/04/2018 CLINICAL DATA:  Hypoxia.  History of lung carcinoma EXAM: PORTABLE CHEST 1 VIEW COMPARISON:  February 03, 2018 FINDINGS: Endotracheal tube tip is 3.0 cm above the carina. Swan-Ganz catheter tip is in the right main pulmonary artery. Nasogastric tube tip and side port are below the diaphragm. There are chest tubes bilaterally. There is  also a mediastinal drain. No pneumothorax. There is apparent radiation therapy change in the right perihilar region. There is postoperative change on the left. No edema or consolidation. Heart size normal. Pulmonary vascularity is distorted on the right appears normal on the left. External pacemaker noted. IMPRESSION: Tube and catheter positions as described without pneumothorax. Postoperative change on the left. Presumed radiation therapy change right perihilar region. Stable cardiac silhouette. Electronically Signed   By: Lowella Grip III M.D.   On: 02/04/2018 07:42   Dg Chest Port 1 View  Result Date: 02/03/2018 CLINICAL DATA:  Post CABG EXAM: PORTABLE CHEST 1 VIEW COMPARISON:  02/03/2018 FINDINGS: Endotracheal tube is 2 cm above the carina. Swan-Ganz catheter tip is in the central right pulmonary artery. NG tube enters the stomach. Bilateral chest tubes in place. No pneumothorax. Heart is borderline in size. Mild perihilar atelectasis or edema. No effusions. IMPRESSION: Postoperative changes from CABG.  No pneumothorax. Bilateral perihilar atelectasis or early edema. Electronically Signed   By: Rolm Baptise M.D.   On: 02/03/2018 19:23    ASSESSMENT / PLAN:  57yo female with profound cardiogenic shock s/p emergent CABG and impella placement for NSTEMI/STEMI.   Acute Hypoxic/Hypercarbic Respiratory Failure in setting of Cardiogenic Shock with pulmonary edema  H/O Small Cell Lung CA s/p Chemo/Radiation 2016 Plan  Vent support - 8cc/kg  F/u CXR  F/u ABG duonebs  No weaning at this time   Cardiogenic Shock in setting of STEMI s/p CABG, Impella, and Balloon Pump placement  Severe CAD, PAF  Cath showed: LM: normal LAD: 100% proximal LCX 95% ostial RCA ok TEE 02/03/18 LVEF 20%, Mild to Mod AR, Mild/Mod MR, RV normal.   Plan  -Per Cardiology/CardioThoracic Surgery  -Cardiac Monitoring  - wean levophed, epi as able for goal MAP >65  -Trend Troponin  -Cooxy Panel in AM  - IABP now out    - starting gentle diuresis  - defer anticoagulation to cards/CVTS   Anion Gap Metabolic Acidosis in setting of Lactic Acidosis  Plan  -Trend BMP  -Trend LA  -Replace electrolytes as indicated   Acute Blood loss Anemia  Plan  -Trend CBC  -Maintain Hbg >7  Sedation Needs  Plan  -RASS goal -2 -Currently on Predex gtt -Wean Fentanyl gtt to achieve RASS    Nickolas Madrid, NP 02/04/2018  11:34 AM Pager: [phone] or [phone]

## 2018-02-04 NOTE — Progress Notes (Signed)
  Called to see patient for increasing ectopy.   Patient with very frequent PVCs on monitor. BPs down into 60s.   Impella position checked personally under ultrasound. Adequate position. Impella turned down to P-6 with improvement in ectopy. LVEF ~10-15%. RV ok.   Epi turned up to 2 and NE turned up to 10 with improvement in BP. Amiodarone started.   D/w Dr. Roxy Manns.   Prognosis remains guarded. Will need to discuss durable VAD with VAD team.   Additional 40 mins CCT.  Glori Bickers, MD  1:57 PM

## 2018-02-04 NOTE — Progress Notes (Signed)
ANTICOAGULATION CONSULT NOTE - Initial Consult  Pharmacy Consult for Heparin Indication: Impella  Allergies  Allergen Reactions  . Codeine Nausea And Vomiting  . Latex Hives and Other (See Comments)    Skin irritation  . Pineapple Hives  . Iodinated Diagnostic Agents Hives and Rash  . Sulfa Antibiotics Hives and Rash    Patient Measurements: Height: 5\' 1"  (154.9 cm) Weight: 129 lb 10.1 oz (58.8 kg) IBW/kg (Calculated) : 47.8  Vital Signs: Temp: 99.3 F (37.4 C) (02/25 1145) Temp Source: Core (02/25 1030) BP: 126/42 (02/25 0729) Pulse Rate: 110 (02/25 1111)  Labs: Recent Labs    02/03/18 1713 02/03/18 1730 02/03/18 1858 02/03/18 1900 02/03/18 2219 02/04/18 0333  HGB 11.2* 10.4* 8.5* 8.4*  --  11.1*  HCT 33.0* 31.9* 25.0* 25.6*  --  33.3*  PLT  --  65*  --  189  --  161  APTT  --  82*  --  55*  --  30  LABPROT  --  31.5*  --  25.5*  --  19.8*  INR  --  3.07  --  2.34  --  1.70  CREATININE 0.80  --   --   --  0.97 0.92  TROPONINI  --   --   --   --  >65.00* >65.00*    Estimated Creatinine Clearance: 55.6 mL/min (by C-G formula based on SCr of 0.92 mg/dL).   Medical History: Past Medical History:  Diagnosis Date  . Acute systolic congestive heart failure ([place]) 02/03/2018  . Allergy   . Anxiety   . Asthma   . Hypertension   . PAF (paroxysmal atrial fibrillation) ([place])   . Prophylactic measure 08/03/14-08/19/14   Prophyl. cranial radiation 24 Gy  . 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 Small cell lung cancer ([place]) 03/16/2014   Assessment: 56yof s/p emergent CABG and Impella placement 2/24 for NSTEMI/STEMI and cardiogenic shock. IABP was also placed but pulled this morning. Orders placed to start heparin purge solution today at noon and systemic heparin at 4pm if needed to achieve therapeutic ACTs.  Goal of Therapy:  ACT 160-180  seconds Monitor platelets by anticoagulation protocol: Yes   Plan:  Pharmacy will follow ACTs along with nursing and assist in heparin management.  Deboraha Sprang 02/04/2018,11:54 AM

## 2018-02-05 ENCOUNTER — Encounter (HOSPITAL_COMMUNITY): Payer: Self-pay | Admitting: Thoracic Surgery (Cardiothoracic Vascular Surgery)

## 2018-02-05 ENCOUNTER — Inpatient Hospital Stay (HOSPITAL_COMMUNITY): Payer: BLUE CROSS/BLUE SHIELD

## 2018-02-05 DIAGNOSIS — Z95811 Presence of heart assist device: Secondary | ICD-10-CM

## 2018-02-05 DIAGNOSIS — I5021 Acute systolic (congestive) heart failure: Secondary | ICD-10-CM

## 2018-02-05 LAB — POCT I-STAT, CHEM 8
BUN: 10 mg/dL (ref 6–20)
CHLORIDE: 94 mmol/L — AB (ref 101–111)
Calcium, Ion: 1.08 mmol/L — ABNORMAL LOW (ref 1.15–1.40)
Creatinine, Ser: 0.8 mg/dL (ref 0.44–1.00)
Glucose, Bld: 164 mg/dL — ABNORMAL HIGH (ref 65–99)
HEMATOCRIT: 34 % — AB (ref 36.0–46.0)
Hemoglobin: 11.6 g/dL — ABNORMAL LOW (ref 12.0–15.0)
Potassium: 3.3 mmol/L — ABNORMAL LOW (ref 3.5–5.1)
SODIUM: 137 mmol/L (ref 135–145)
TCO2: 27 mmol/L (ref 22–32)

## 2018-02-05 LAB — BPAM FFP
Blood Product Expiration Date: 201903012359
Blood Product Expiration Date: 201903012359
ISSUE DATE / TIME: 201902250812
ISSUE DATE / TIME: 201902250618
UNIT TYPE AND RH: 600
Unit Type and Rh: 6200

## 2018-02-05 LAB — BLOOD GAS, ARTERIAL
ACID-BASE EXCESS: 3.4 mmol/L — AB (ref 0.0–2.0)
BICARBONATE: 27.2 mmol/L (ref 20.0–28.0)
Drawn by: 51702
FIO2: 40
O2 Saturation: 98.5 %
PEEP/CPAP: 5 cmH2O
Patient temperature: 99.8
RATE: 20 resp/min
VT: 450 mL
pCO2 arterial: 41 mmHg (ref 32.0–48.0)
pH, Arterial: 7.44 (ref 7.350–7.450)
pO2, Arterial: 115 mmHg — ABNORMAL HIGH (ref 83.0–108.0)

## 2018-02-05 LAB — CBC WITH DIFFERENTIAL/PLATELET
Basophils Absolute: 0 10*3/uL (ref 0.0–0.1)
Basophils Relative: 0 %
Eosinophils Absolute: 0 10*3/uL (ref 0.0–0.7)
Eosinophils Relative: 0 %
HEMATOCRIT: 33.6 % — AB (ref 36.0–46.0)
HEMOGLOBIN: 11.3 g/dL — AB (ref 12.0–15.0)
LYMPHS ABS: 1.3 10*3/uL (ref 0.7–4.0)
Lymphocytes Relative: 10 %
MCH: 30.9 pg (ref 26.0–34.0)
MCHC: 33.6 g/dL (ref 30.0–36.0)
MCV: 91.8 fL (ref 78.0–100.0)
MONOS PCT: 7 %
Monocytes Absolute: 0.8 10*3/uL (ref 0.1–1.0)
NEUTROS ABS: 10.2 10*3/uL — AB (ref 1.7–7.7)
NEUTROS PCT: 83 %
Platelets: 125 10*3/uL — ABNORMAL LOW (ref 150–400)
RBC: 3.66 MIL/uL — AB (ref 3.87–5.11)
RDW: 19.4 % — ABNORMAL HIGH (ref 11.5–15.5)
WBC: 12.4 10*3/uL — AB (ref 4.0–10.5)

## 2018-02-05 LAB — GLUCOSE, CAPILLARY
GLUCOSE-CAPILLARY: 86 mg/dL (ref 65–99)
Glucose-Capillary: 121 mg/dL — ABNORMAL HIGH (ref 65–99)
Glucose-Capillary: 148 mg/dL — ABNORMAL HIGH (ref 65–99)
Glucose-Capillary: 167 mg/dL — ABNORMAL HIGH (ref 65–99)
Glucose-Capillary: 189 mg/dL — ABNORMAL HIGH (ref 65–99)
Glucose-Capillary: 99 mg/dL (ref 65–99)

## 2018-02-05 LAB — POCT ACTIVATED CLOTTING TIME
ACTIVATED CLOTTING TIME: 147 s
ACTIVATED CLOTTING TIME: 180 s
ACTIVATED CLOTTING TIME: 175 s
ACTIVATED CLOTTING TIME: 197 s
ACTIVATED CLOTTING TIME: 180 s
ACTIVATED CLOTTING TIME: 180 s
Activated Clotting Time: 186 seconds
Activated Clotting Time: 191 seconds
Activated Clotting Time: 197 seconds
Activated Clotting Time: 175 seconds
Activated Clotting Time: 175 seconds
Activated Clotting Time: 175 seconds
Activated Clotting Time: 169 seconds

## 2018-02-05 LAB — COMPREHENSIVE METABOLIC PANEL
ALK PHOS: 46 U/L (ref 38–126)
ALT: 61 U/L — AB (ref 14–54)
AST: 177 U/L — ABNORMAL HIGH (ref 15–41)
Albumin: 3.3 g/dL — ABNORMAL LOW (ref 3.5–5.0)
Anion gap: 13 (ref 5–15)
BUN: 14 mg/dL (ref 6–20)
CHLORIDE: 104 mmol/L (ref 101–111)
CO2: 23 mmol/L (ref 22–32)
CREATININE: 0.93 mg/dL (ref 0.44–1.00)
Calcium: 8.1 mg/dL — ABNORMAL LOW (ref 8.9–10.3)
Glucose, Bld: 167 mg/dL — ABNORMAL HIGH (ref 65–99)
Potassium: 3.1 mmol/L — ABNORMAL LOW (ref 3.5–5.1)
SODIUM: 140 mmol/L (ref 135–145)
Total Bilirubin: 3 mg/dL — ABNORMAL HIGH (ref 0.3–1.2)
Total Protein: 5.6 g/dL — ABNORMAL LOW (ref 6.5–8.1)

## 2018-02-05 LAB — PREPARE FRESH FROZEN PLASMA
UNIT DIVISION: 0
Unit division: 0

## 2018-02-05 LAB — COOXEMETRY PANEL
CARBOXYHEMOGLOBIN: 1.6 % — AB (ref 0.5–1.5)
Methemoglobin: 1 % (ref 0.0–1.5)
O2 Saturation: 68.9 %
Total hemoglobin: 10.7 g/dL — ABNORMAL LOW (ref 12.0–16.0)

## 2018-02-05 LAB — LACTATE DEHYDROGENASE: LDH: 1027 U/L — ABNORMAL HIGH (ref 98–192)

## 2018-02-05 LAB — MAGNESIUM: MAGNESIUM: 2.1 mg/dL (ref 1.7–2.4)

## 2018-02-05 LAB — APTT: aPTT: 149 seconds — ABNORMAL HIGH (ref 24–36)

## 2018-02-05 MED ORDER — POTASSIUM CHLORIDE 10 MEQ/50ML IV SOLN
10.0000 meq | Freq: Once | INTRAVENOUS | Status: AC
  Administered 2018-02-05: 10 meq via INTRAVENOUS

## 2018-02-05 MED ORDER — POTASSIUM CHLORIDE 10 MEQ/50ML IV SOLN
10.0000 meq | INTRAVENOUS | Status: DC | PRN
  Administered 2018-02-05 (×2): 10 meq via INTRAVENOUS
  Filled 2018-02-05 (×2): qty 50

## 2018-02-05 MED ORDER — POTASSIUM CHLORIDE 10 MEQ/50ML IV SOLN
10.0000 meq | INTRAVENOUS | Status: AC
  Administered 2018-02-05 (×2): 10 meq via INTRAVENOUS
  Filled 2018-02-05: qty 50

## 2018-02-05 MED ORDER — FENTANYL CITRATE (PF) 100 MCG/2ML IJ SOLN: 50.0000 ug | Freq: Once | INTRAMUSCULAR | Status: DC

## 2018-02-05 MED ORDER — CHLORHEXIDINE GLUCONATE 0.12% ORAL RINSE (MEDLINE KIT)
15.0000 mL | Freq: Two times a day (BID) | OROMUCOSAL | Status: DC
  Administered 2018-02-05 – 2018-02-07 (×5): 15 mL via OROMUCOSAL

## 2018-02-05 MED ORDER — PNEUMOCOCCAL VAC POLYVALENT 25 MCG/0.5ML IJ INJ: 0.5000 mL | INJECTION | INTRAMUSCULAR | Status: DC | PRN

## 2018-02-05 MED ORDER — INFLUENZA VAC SPLIT QUAD 0.5 ML IM SUSY: 0.5000 mL | PREFILLED_SYRINGE | INTRAMUSCULAR | Status: DC | PRN

## 2018-02-05 MED ORDER — SODIUM CHLORIDE 0.9 % IV SOLN
0.5000 mg/h | INTRAVENOUS | Status: DC
  Administered 2018-02-05: 0.5 mg/h via INTRAVENOUS
  Filled 2018-02-05: qty 10

## 2018-02-05 MED ORDER — POTASSIUM CHLORIDE 10 MEQ/50ML IV SOLN
10.0000 meq | INTRAVENOUS | Status: AC
Start: 2018-02-05 — End: 2018-02-05
  Administered 2018-02-05 (×3): 10 meq via INTRAVENOUS
  Filled 2018-02-05 (×3): qty 50

## 2018-02-05 MED ORDER — NOREPINEPHRINE BITARTRATE 1 MG/ML IV SOLN
0.0000 ug/min | INTRAVENOUS | Status: DC
  Administered 2018-02-05: 14 ug/min via INTRAVENOUS
  Administered 2018-02-06: 20 ug/min via INTRAVENOUS
  Administered 2018-02-07: 13 ug/min via INTRAVENOUS
  Administered 2018-02-08: 09:00:00 via INTRAVENOUS
  Filled 2018-02-05 (×6): qty 16

## 2018-02-05 MED ORDER — MIDAZOLAM BOLUS VIA INFUSION
1.0000 mg | INTRAVENOUS | Status: DC | PRN
  Administered 2018-02-06: 2 mg via INTRAVENOUS
  Administered 2018-02-07: 1 mg via INTRAVENOUS
  Filled 2018-02-05: qty 2

## 2018-02-05 MED ORDER — SODIUM CHLORIDE 0.9 % IV SOLN
0.0000 mg/h | INTRAVENOUS | Status: DC
  Administered 2018-02-05 – 2018-02-07 (×3): 1 mg/h via INTRAVENOUS
  Filled 2018-02-05 (×2): qty 10

## 2018-02-05 MED ORDER — POTASSIUM CHLORIDE 10 MEQ/50ML IV SOLN
10.0000 meq | INTRAVENOUS | Status: AC
  Administered 2018-02-05 (×3): 10 meq via INTRAVENOUS
  Filled 2018-02-05 (×2): qty 50

## 2018-02-05 MED ORDER — ORAL CARE MOUTH RINSE
15.0000 mL | OROMUCOSAL | Status: DC
  Administered 2018-02-05 – 2018-02-08 (×27): 15 mL via OROMUCOSAL

## 2018-02-05 MED FILL — Electrolyte-R (PH 7.4) Solution: INTRAVENOUS | Qty: 3000 | Status: AC

## 2018-02-05 MED FILL — Lidocaine HCl IV Inj 20 MG/ML: INTRAVENOUS | Qty: 5 | Status: AC

## 2018-02-05 MED FILL — Sodium Chloride IV Soln 0.9%: INTRAVENOUS | Qty: 2000 | Status: AC

## 2018-02-05 MED FILL — Mannitol IV Soln 20%: INTRAVENOUS | Qty: 500 | Status: AC

## 2018-02-05 MED FILL — Sodium Bicarbonate IV Soln 8.4%: INTRAVENOUS | Qty: 50 | Status: AC

## 2018-02-05 NOTE — Progress Notes (Signed)
ANTICOAGULATION CONSULT NOTE - Follow Up Consult  Pharmacy Consult for Heparin Indication: Impella  Allergies  Allergen Reactions  . Codeine Nausea And Vomiting  . Latex Hives and Other (See Comments)    Skin irritation  . Pineapple Hives  . Iodinated Diagnostic Agents Hives and Rash  . Sulfa Antibiotics Hives and Rash    Patient Measurements: Height: 5\' 1"  (154.9 cm) Weight: 124 lb 9 oz (56.5 kg) IBW/kg (Calculated) : 47.8  Vital Signs: Temp: 99.1 F (37.3 C) (02/26 1330) Temp Source: Core (02/26 0400) BP: 114/79 (02/26 1221) Pulse Rate: 114 (02/26 1221)  Labs: Recent Labs    02/03/18 1730  02/03/18 1900 02/03/18 2219 02/04/18 0333 02/04/18 1454 02/04/18 1633 02/04/18 1646 02/05/18 0439  HGB 10.4*   < > 8.4*  --  11.1*  --  10.8* 10.9* 11.3*  HCT 31.9*   < > 25.6*  --  33.3*  --  33.1* 32.0* 33.6*  PLT 65*  --  189  --  161  --  144*  --  125*  APTT 82*  --  55*  --  30  --   --   --  149*  LABPROT 31.5*  --  25.5*  --  19.8*  --   --   --   --   INR 3.07  --  2.34  --  1.70  --   --   --   --   CREATININE  --   --   --  0.97 0.92  --  0.97 0.80 0.93  TROPONINI  --   --   --  >65.00* >65.00* >65.00*  --   --   --    < > = values in this interval not displayed.    Estimated Creatinine Clearance: 50.4 mL/min (by C-G formula based on SCr of 0.93 mg/dL).   Assessment: 56yof s/p emergent CABG and Impella placement 2/24 for NSTEMI/STEMI and cardiogenic shock. IABP was also placed but pulled 2/25. Heparin purge and systemic heparin started yesterday afternoon. Heparin purge @ 10.1 mL/hr and systemic at 400 units/hr (total ~ 900 units/hr) with ACTs mostly at goal 175-197 seconds. No bleeding reported.  Goal of Therapy:  ACT 160-180 seconds Monitor platelets by anticoagulation protocol: Yes   Plan:  Pharmacy will follow ACTs along with nursing and assist in heparin management.  Deboraha Sprang 02/05/2018,2:17 PM

## 2018-02-05 NOTE — Progress Notes (Signed)
Patient ID: Brittany [name], female   DOB: Jul 18, 1961, 57 y.o.   MRN: [id number] TCTS Evening Rounds:  Remains on Impella with flow 4.0, P7 CVP 8 Has had increased pressor requirement today with levophed up to 22 mcg from 12 mcg this am.  Sedated on vent  Good urine output on lasix drip. Replacing K+. Will decrease lasix drip since PAP and CVP on low side.  BMET    Component Value Date/Time   NA 137 02/05/2018 1645   NA 142 12/23/2014 0829   K 3.3 (L) 02/05/2018 1645   K 3.9 12/23/2014 0829   CL 94 (L) 02/05/2018 1645   CO2 23 02/05/2018 0439   CO2 26 12/23/2014 0829   GLUCOSE 164 (H) 02/05/2018 1645   GLUCOSE 110 12/23/2014 0829   BUN 10 02/05/2018 1645   BUN 7.2 12/23/2014 0829   CREATININE 0.80 02/05/2018 1645   CREATININE 0.7 12/23/2014 0829   CALCIUM 8.1 (L) 02/05/2018 0439   CALCIUM 9.3 12/23/2014 0829   GFRNONAA >60 02/05/2018 0439   GFRAA >60 02/05/2018 0439   CBC    Component Value Date/Time   WBC 12.4 (H) 02/05/2018 0439   RBC 3.66 (L) 02/05/2018 0439   HGB 11.6 (L) 02/05/2018 1645   HGB 17.6 (H) 12/23/2014 0829   HCT 34.0 (L) 02/05/2018 1645   HCT 53.9 (H) 12/23/2014 0829   PLT 125 (L) 02/05/2018 0439   PLT 159 12/23/2014 0829   MCV 91.8 02/05/2018 0439   MCV 102.1 (H) 12/23/2014 0829   MCH 30.9 02/05/2018 0439   MCHC 33.6 02/05/2018 0439   RDW 19.4 (H) 02/05/2018 0439   RDW 20.9 (H) 12/23/2014 0829   LYMPHSABS 1.3 02/05/2018 0439   LYMPHSABS 0.7 (L) 12/23/2014 0829   MONOABS 0.8 02/05/2018 0439   MONOABS 0.4 12/23/2014 0829   EOSABS 0.0 02/05/2018 0439   EOSABS 0.2 12/23/2014 0829   BASOSABS 0.0 02/05/2018 0439   BASOSABS 0.0 12/23/2014 0829

## 2018-02-05 NOTE — Progress Notes (Addendum)
Advanced Heart Failure Rounding Note  PCP-Cardiologist: No primary care provider on file.   Subjective:    POD #2 emergent CABG  Coox 68.9% on Norepi 10, Epi 2, and Milrinone 0.5 mcg/kg/min. Creatinine stable. K 3.1.  Intubated and sedated. Wakes up and follows commands intermittently.   Weight down 5 lbs on lasix 8 mg/hr. Made 3.6 L UOP yesterday.   IABP removed 02/04/18. Remains on Impella at p-7. Turned down yesterday with frequent PVCs.  Swan Numbers Personally reviewed. CVP 12 PAP 29/13 (19) CO 3.7 CI 2.5  TEE 02/03/18 LVEF 20%, Mild to Mod AR, Mild/Mod MR, RV normal.   Objective:   Weight Range: 124 lb 9 oz (56.5 kg) Body mass index is 23.54 kg/m.   Vital Signs:   Temp:  [98.4 F (36.9 C)-101.5 F (38.6 C)] 100 F (37.8 C) (02/26 0715) Pulse Rate:  [25-117] 114 (02/26 0345) Resp:  [0-24] 20 (02/26 0715) SpO2:  [93 %-100 %] 100 % (02/26 0715) Arterial Line BP: (58-290)/(48-279) 89/69 (02/26 0715) FiO2 (%):  [40 %] 40 % (02/26 0345) Weight:  [124 lb 9 oz (56.5 kg)] 124 lb 9 oz (56.5 kg) (02/26 0400) Last BM Date: (pta)  Weight change: Filed Weights   02/03/18 1119 02/04/18 0400 02/05/18 0400  Weight: 116 lb 13.5 oz (53 kg) 129 lb 10.1 oz (58.8 kg) 124 lb 9 oz (56.5 kg)    Intake/Output:   Intake/Output Summary (Last 24 hours) at 02/05/2018 0729 Last data filed at 02/05/2018 0700 Gross per 24 hour  Intake 3695.83 ml  Output 4485 ml  Net -789.17 ml    Physical Exam    General: Intubated/sedated.  HEENT: + ETT Neck: Supple. JVP difficult, but elevated.. Carotids 2+ bilat; no bruits. No thyromegaly or nodule noted. Cor: Sternal dressing intact. PMI non-displaced. Regular, + impella sounds, Heart sounds difficult to appreciate over impella sound. Positive chest tubes with continued drainage.  Lungs: Diminished basilar sounds Abdomen: Soft, non-tender, non-distended, no HSM. No bruits or masses. +BS  Extremities: No cyanosis, clubbing, or rash.  Trace ankle edema. Cool to the touch. L groin impella site stable.  Neuro: Intubated/sedated   Telemetry   Sinus tach 110s, +PVCs, personally reviewed.   EKG    No new tracings.    Labs    CBC Recent Labs    02/03/18 0853  02/04/18 1633 02/04/18 1646 02/05/18 0439  WBC 11.7*   < > 9.6  --  12.4*  NEUTROABS 9.8*  --   --   --  10.2*  HGB 18.1*   < > 10.8* 10.9* 11.3*  HCT 53.9*   < > 33.1* 32.0* 33.6*  MCV 101.1*   < > 93.2  --  91.8  PLT 204   < > 144*  --  125*   < > = values in this interval not displayed.   Basic Metabolic Panel Recent Labs    02/04/18 0333 02/04/18 1454  02/04/18 1646 02/05/18 0439  NA 143  --   --  143 140  K 3.7  --   --  4.0 3.1*  CL 113*  --   --  107 104  CO2 22  --   --   --  23  GLUCOSE 122*  --   --  178* 167*  BUN 17  --   --  19 14  CREATININE 0.92  --    < > 0.80 0.93  CALCIUM 7.4*  --   --   --  8.1*  MG 3.1* 2.9*  --   --  2.1   < > = values in this interval not displayed.   Liver Function Tests Recent Labs    02/03/18 0853 02/05/18 0439  AST <5* 177*  ALT 55* 61*  ALKPHOS 76 46  BILITOT 1.2 3.0*  PROT 8.2* 5.6*  ALBUMIN 4.5 3.3*   Recent Labs    02/03/18 0853  LIPASE 34   Cardiac Enzymes Recent Labs    02/03/18 2219 02/04/18 0333 02/04/18 1454  TROPONINI >65.00* >65.00* >65.00*    BNP: BNP (last 3 results) Recent Labs    02/03/18 0853  BNP 3,127.4*    ProBNP (last 3 results) No results for input(s): PROBNP in the last 8760 hours.   D-Dimer Recent Labs    02/03/18 0853  DDIMER 1.07*   Hemoglobin A1C No results for input(s): HGBA1C in the last 72 hours. Fasting Lipid Panel No results for input(s): CHOL, HDL, LDLCALC, TRIG, CHOLHDL, LDLDIRECT in the last 72 hours. Thyroid Function Tests No results for input(s): TSH, T4TOTAL, T3FREE, THYROIDAB in the last 72 hours.  Invalid input(s): FREET3  Other results:   Imaging    No results found.   Medications:     Scheduled  Medications: . aspirin EC  325 mg Oral Daily   Or  . aspirin  324 mg Per Tube Daily  . bisacodyl  10 mg Rectal Daily  . chlorhexidine gluconate (MEDLINE KIT)  15 mL Mouth Rinse BID  . Chlorhexidine Gluconate Cloth  6 each Topical Daily  . docusate  100 mg Per Tube BID  . insulin aspart  0-24 Units Subcutaneous Q4H  . insulin regular  0-10 Units Intravenous TID WC  . ipratropium-albuterol  3 mL Nebulization Q4H  . mouth rinse  15 mL Mouth Rinse QID  . pantoprazole (PROTONIX) IV  40 mg Intravenous Q24H  . sodium chloride flush  10-40 mL Intracatheter Q12H  . sodium chloride flush  3 mL Intravenous Q12H    Infusions: . sodium chloride 10 mL/hr at 02/05/18 0700  . sodium chloride    . sodium chloride    . sodium chloride    . sodium chloride    . sodium chloride    . amiodarone 30 mg/hr (02/05/18 0700)  . cefUROXime (ZINACEF)  IV Stopped (02/04/18 2250)  . dexmedetomidine (PRECEDEX) IV infusion 0.697 mcg/kg/hr (02/05/18 0700)  . dextrose 5 % Impella 5.0 Purge solution Stopped (02/04/18 1142)  . EPINEPHrine 4 mg in dextrose 5% 250 mL infusion (16 mcg/mL) 2 mcg/min (02/05/18 0700)  . fentaNYL infusion INTRAVENOUS 250 mcg/hr (02/05/18 0700)  . furosemide (LASIX) infusion 8 mg/hr (02/05/18 0700)  . impella catheter heparin 50 unit/mL in dextrose 5%    . heparin 400 Units/hr (02/05/18 0700)  . insulin (NOVOLIN-R) infusion Stopped (02/04/18 0800)  . lactated ringers    . lactated ringers    . lactated ringers    . milrinone 0.5 mcg/kg/min (02/05/18 0700)  . nitroGLYCERIN    . norepinephrine (LEVOPHED) Adult infusion 10 mcg/min (02/05/18 0700)  . phenylephrine (NEO-SYNEPHRINE) Adult infusion    . potassium chloride 10 mEq (02/05/18 0655)    PRN Medications: sodium chloride, sodium chloride, Place/Maintain arterial line **AND** sodium chloride, Place/Maintain arterial line **AND** sodium chloride, acetaminophen **OR** acetaminophen (TYLENOL) oral liquid 160 mg/5 mL, fentaNYL,  lactated ringers, metoprolol tartrate, midazolam, ondansetron (ZOFRAN) IV, potassium chloride, sodium chloride flush, sodium chloride flush  Patient Profile   Brittany [name] is a 57 y/o woman with  a history of PAF, preveious smoker quit 4 years ago, hypertension, 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 She was admitted with NSTEMI and shock.  Emergent cath 02/03/18 showed LAD 100% stenosed, LCx 95% stenosed. Taken for emergent CABG 02/03/18  Assessment/Plan   1. NSTEMI/STEMI s/p Emergent CABG 02/03/18 - New LBBB on ECG - cath with severe 2v CAD as above with likely severe LV dysfunction/ICM - s/p Emergent CABG 02/03/18.  - On increased pressor support this am. Vagals down when she wakes up.  - Diuresing as below.   2. Acute systolic HF -> cardiogenic shock in setting of #1 - TEE 02/03/18 LVEF 20%, Mild to Mod AR, Mild/Mod MR, RV normal.  - Coox 68.9% this am on Epi 2, Norepi 10, and milrinone 0.5 mcg/kg/min  - Volume status remains elevated with CVP 12. Continue lasix gtt at 8 mg/hr.  - No lasix for now with weaning of pressors.  - No BB yet with cardiogenic shock.  - Discussed at VAD MRB 02/04/18 for potential of durable VAD placement.   3. Acute respiratory failure d/t pulmonary edema - Remains intubated and sedated.    4. Tachycardia - Initial ECG looks like sinus tach with new LBBB - Remains in Sinus tach by tele.  - No BB yet with cardiogenic shock.  - Goal K > 4.0 and Mg > 2.0. Will give 4 runs of K.   5. PAF - Previously not on AC. - CHA2DS2/VASc is at least 4.  (CHF, Vasc disease, HTN, Female)  - On heparin for now.   6. H/o SCLC - s/p treatment 2015. Lost to f/u since 04/2015. Will need f/u with oncology (Dr. Julien Nordmann) as outpatient. No change.   Medication concerns reviewed with patient and pharmacy team. Barriers identified: None at this time.   Length of Stay:  2  Annamaria Helling  02/05/2018, 7:29 AM  Advanced Heart Failure Team Pager [phone] (M-F; 7a - 4p)  Please contact [facility] for night-coverage after hours (4p -7a ) and weekends on [email or web address]  Agree with above.   Remains on vent. Awake and follows commands.  On Epi at 2. NE at 14 and milrinone 0.5. Impella back up to P-8 with no further suction events. Flow on pump ~4L. Swan numbers stable. CVP 10. PaPI 1.7. Volume status improving on lasix gtt. Renal function stable.   On exam Awake on vent RIJ swan HEENT ok  Cor Tachy regular. + impella hum CTs with serosanguinous drainage from pleural tubes Ab soft +distended. Hypoactive BS Ext: warm mild edema  Overall stable with current support. Will continue to diurese. Wean inotropes as tolerated. Continue Impella for now. Discussed at [place] and might be candidate for durable VAD if unable to wean from current support but I am hopeful that she will continue to progress.   CRITICAL CARE Performed by: Glori Bickers  Total critical care time: 45 minutes  Critical care time was exclusive of separately billable procedures and treating other patients.  Critical care was necessary to treat or prevent imminent or life-threatening deterioration.  Critical care was time spent personally by me (independent of midlevel providers or residents) on the following activities: development of treatment plan with patient and/or surrogate as well as nursing, discussions with consultants, evaluation of patient's response to treatment, examination of patient, obtaining history from patient or surrogate, ordering and performing treatments and interventions, ordering and review of laboratory  studies, ordering and review of radiographic studies, pulse oximetry and re-evaluation of patient's condition.  Glori Bickers, MD  3:47 PM

## 2018-02-05 NOTE — Progress Notes (Signed)
This note also relates to the following rows which could not be included: SpO2 - Cannot attach notes to unvalidated device data  Scanned by RN administered by RT

## 2018-02-05 NOTE — Anesthesia Postprocedure Evaluation (Signed)
Anesthesia Post Note  Patient: Brittany [name]  Procedure(s) Performed: INTRAOPERATIVE TRANSESOPHAGEAL ECHOCARDIOGRAM (N/A ) CORONARY ARTERY BYPASS GRAFTING (CABG) (N/A Chest) PLACEMENT OF IMPELLA LEFT VENTRICULAR ASSIST DEVICE LD     Patient location during evaluation: SICU Anesthesia Type: General Level of consciousness: responds to stimulation Pain management: pain level controlled Vital Signs Assessment: post-procedure vital signs reviewed and stable Respiratory status: patient remains intubated per anesthesia plan Cardiovascular status: stable Postop Assessment: no apparent nausea or vomiting Anesthetic complications: no    Last Vitals:  Vitals:   02/05/18 1315 02/05/18 1330  BP:    Pulse:    Resp: 20 20  Temp: 37.4 C 37.3 C  SpO2: 100% 100%    Last Pain:  Vitals:   02/05/18 0400  TempSrc: Core  PainSc:                  Karyl Kinnier Ellender

## 2018-02-05 NOTE — Progress Notes (Signed)
[address]       [place], [address]             [phone]        CARDIOTHORACIC SURGERY PROGRESS NOTE   R2 Days Post-Op Procedure(s) (LRB): INTRAOPERATIVE TRANSESOPHAGEAL ECHOCARDIOGRAM (N/A) CORONARY ARTERY BYPASS GRAFTING (CABG) (N/A) PLACEMENT OF IMPELLA LEFT VENTRICULAR ASSIST DEVICE LD  Subjective: Sedated on vent.  Wakes up and follows commands.  Objective: Vital signs: BP Readings from Last 1 Encounters:  02/04/18 (!) 126/42   Pulse Readings from Last 1 Encounters:  02/05/18 (!) 114   Resp Readings from Last 1 Encounters:  02/05/18 20   Temp Readings from Last 1 Encounters:  02/05/18 100.2 F (37.9 C)    Hemodynamics: PAP: (25-46)/(11-31) 26/11 CVP:  [8 mmHg-23 mmHg] 12 mmHg PCWP:  [18 mmHg] 18 mmHg CO:  [2.8 L/min-4.7 L/min] 3.7 L/min CI:  [1.9 L/min/m2-3.2 L/min/m2] 2.5 L/min/m2  Mixed venous co-ox 69%  Physical Exam:  Rhythm:   Sinus tach. One episode non-sustained VT overnight  Breath sounds: Coarse but fairly clear  Heart sounds:  tachy  Incisions:  Dressings dry, intact  Abdomen:  Soft, non-distended, quiet  Extremities:  Warm, well-perfused  Chest tubes:  decreasing volume thin serosanguinous output, no air leak    Intake/Output from previous day: 02/25 0701 - 02/26 0700 In: 3813.8 [I.V.:2661.8; Blood:699; NG/GT:30; IV Piggyback:200] Out: 1884 [ZYSAY:3016; Chest Tube:820] Intake/Output this shift: Total I/O In: 169.5 [I.V.:109.4; Other:10.1; IV Piggyback:50] Out: 175 [Urine:175]  Lab Results:  CBC: Recent Labs    02/04/18 1633 02/04/18 1646 02/05/18 0439  WBC 9.6  --  12.4*  HGB 10.8* 10.9* 11.3*  HCT 33.1* 32.0* 33.6*  PLT 144*  --  125*    BMET:  Recent Labs    02/04/18 0333  02/04/18 1646 02/05/18 0439  NA 143  --  143 140  K 3.7  --  4.0 3.1*  CL 113*  --  107 104  CO2 22  --   --  23  GLUCOSE 122*  --  178* 167*  BUN 17  --  19 14  CREATININE 0.92   < > 0.80 0.93  CALCIUM 7.4*  --    --  8.1*   < > = values in this interval not displayed.     PT/INR:   Recent Labs    02/04/18 0333  LABPROT 19.8*  INR 1.70    CBG (last 3)  Recent Labs    02/04/18 2001 02/05/18 0020 02/05/18 0452  GLUCAP 189* 99 167*    ABG    Component Value Date/Time   PHART 7.440 02/05/2018 0345   PCO2ART 41.0 02/05/2018 0345   PO2ART 115 (H) 02/05/2018 0345   HCO3 27.2 02/05/2018 0345   TCO2 23 02/04/2018 1646   ACIDBASEDEF 1.0 02/04/2018 0350   O2SAT 68.9 02/05/2018 0500    CXR: Not done  Assessment/Plan: S/P Procedure(s) (LRB): INTRAOPERATIVE TRANSESOPHAGEAL ECHOCARDIOGRAM (N/A) CORONARY ARTERY BYPASS GRAFTING (CABG) (N/A) PLACEMENT OF IMPELLA LEFT VENTRICULAR ASSIST DEVICE LD  Overall stable POD2  CV:  Impella LVAD functioning well w/ flows 4.0-4.5 L/min  PA pressures and CVP relatively low, C.I. 2.2-2.5 and co-ox 68%  A-line now demonstrating pulsatility c/w some LV ejection  Milrinone @ 0.5  Epi @ 2 Levophed @ 10  Resp:  O2 sats 100% on FiO2 40%   Renal:  UOP excellent on lasix drip  Creatinine stable 0.9  Hypokalemia, induced by loop diuretics  Weight  down 5 lbs I/O's -671 yesterday  Neuro:  Grossly intact  Sedated on vent  Heme:  Expected post op acute blood loss anemia, Hgb stable 11.3  Platelet count down slightly 125k  Chest tube output low, no signs of active bleeding  ACT's 175-197 on IV heparin  Plan:  Continue current support   Rexene Alberts, MD 02/05/2018 8:13 AM

## 2018-02-06 ENCOUNTER — Inpatient Hospital Stay (HOSPITAL_COMMUNITY): Payer: BLUE CROSS/BLUE SHIELD

## 2018-02-06 DIAGNOSIS — Z95811 Presence of heart assist device: Secondary | ICD-10-CM

## 2018-02-06 DIAGNOSIS — J9600 Acute respiratory failure, unspecified whether with hypoxia or hypercapnia: Secondary | ICD-10-CM

## 2018-02-06 DIAGNOSIS — I5021 Acute systolic (congestive) heart failure: Secondary | ICD-10-CM

## 2018-02-06 LAB — POCT ACTIVATED CLOTTING TIME
ACTIVATED CLOTTING TIME: 164 s
ACTIVATED CLOTTING TIME: 164 s
ACTIVATED CLOTTING TIME: 164 s
Activated Clotting Time: 180 seconds
Activated Clotting Time: 186 seconds
Activated Clotting Time: 169 seconds
Activated Clotting Time: 175 seconds
Activated Clotting Time: 175 seconds

## 2018-02-06 LAB — MAGNESIUM: MAGNESIUM: 1.6 mg/dL — AB (ref 1.7–2.4)

## 2018-02-06 LAB — COOXEMETRY PANEL
Carboxyhemoglobin: 1.2 % (ref 0.5–1.5)
Carboxyhemoglobin: 1.6 % — ABNORMAL HIGH (ref 0.5–1.5)
METHEMOGLOBIN: 1.6 % — AB (ref 0.0–1.5)
Methemoglobin: 1 % (ref 0.0–1.5)
O2 SAT: 65.7 %
O2 SAT: 72.2 %
TOTAL HEMOGLOBIN: 10.3 g/dL — AB (ref 12.0–16.0)
TOTAL HEMOGLOBIN: 9.7 g/dL — AB (ref 12.0–16.0)

## 2018-02-06 LAB — BLOOD GAS, ARTERIAL
Acid-Base Excess: 8.2 mmol/L — ABNORMAL HIGH (ref 0.0–2.0)
Bicarbonate: 31.9 mmol/L — ABNORMAL HIGH (ref 20.0–28.0)
DRAWN BY: 414221
FIO2: 40
MECHVT: 380 mL
O2 SAT: 98.8 %
PATIENT TEMPERATURE: 98.6
PCO2 ART: 41 mmHg (ref 32.0–48.0)
PEEP: 5 cmH2O
PH ART: 7.502 — AB (ref 7.350–7.450)
RATE: 20 resp/min
pO2, Arterial: 135 mmHg — ABNORMAL HIGH (ref 83.0–108.0)

## 2018-02-06 LAB — GLUCOSE, CAPILLARY
GLUCOSE-CAPILLARY: 144 mg/dL — AB (ref 65–99)
GLUCOSE-CAPILLARY: 151 mg/dL — AB (ref 65–99)
Glucose-Capillary: 132 mg/dL — ABNORMAL HIGH (ref 65–99)
Glucose-Capillary: 121 mg/dL — ABNORMAL HIGH (ref 65–99)
Glucose-Capillary: 125 mg/dL — ABNORMAL HIGH (ref 65–99)

## 2018-02-06 LAB — CBC WITH DIFFERENTIAL/PLATELET
BASOS PCT: 0 %
Basophils Absolute: 0 10*3/uL (ref 0.0–0.1)
EOS PCT: 1 %
Eosinophils Absolute: 0.1 10*3/uL (ref 0.0–0.7)
HCT: 30.2 % — ABNORMAL LOW (ref 36.0–46.0)
Hemoglobin: 10.3 g/dL — ABNORMAL LOW (ref 12.0–15.0)
Lymphocytes Relative: 12 %
Lymphs Abs: 1.5 10*3/uL (ref 0.7–4.0)
MCH: 31.7 pg (ref 26.0–34.0)
MCHC: 34.1 g/dL (ref 30.0–36.0)
MCV: 92.9 fL (ref 78.0–100.0)
MONO ABS: 0.9 10*3/uL (ref 0.1–1.0)
Monocytes Relative: 7 %
Neutro Abs: 9.9 10*3/uL — ABNORMAL HIGH (ref 1.7–7.7)
Neutrophils Relative %: 80 %
PLATELETS: 109 10*3/uL — AB (ref 150–400)
RBC: 3.25 MIL/uL — ABNORMAL LOW (ref 3.87–5.11)
RDW: 18.8 % — AB (ref 11.5–15.5)
WBC: 12.4 10*3/uL — ABNORMAL HIGH (ref 4.0–10.5)

## 2018-02-06 LAB — APTT: APTT: 88 s — AB (ref 24–36)

## 2018-02-06 LAB — COMPREHENSIVE METABOLIC PANEL
ALBUMIN: 3 g/dL — AB (ref 3.5–5.0)
ALT: 56 U/L — ABNORMAL HIGH (ref 14–54)
ANION GAP: 9 (ref 5–15)
AST: 93 U/L — ABNORMAL HIGH (ref 15–41)
Alkaline Phosphatase: 52 U/L (ref 38–126)
BUN: 9 mg/dL (ref 6–20)
CO2: 28 mmol/L (ref 22–32)
Calcium: 8.3 mg/dL — ABNORMAL LOW (ref 8.9–10.3)
Chloride: 97 mmol/L — ABNORMAL LOW (ref 101–111)
Creatinine, Ser: 0.98 mg/dL (ref 0.44–1.00)
GFR calc Af Amer: >60 mL/min (ref >60)
GFR calc non Af Amer: >60 mL/min (ref >60)
GLUCOSE: 175 mg/dL — AB (ref 65–99)
POTASSIUM: 3.9 mmol/L (ref 3.5–5.1)
SODIUM: 134 mmol/L — AB (ref 135–145)
TOTAL PROTEIN: 5.4 g/dL — AB (ref 6.5–8.1)
Total Bilirubin: 3.2 mg/dL — ABNORMAL HIGH (ref 0.3–1.2)

## 2018-02-06 LAB — CALCIUM, IONIZED: CALCIUM, IONIZED, SERUM: 4.6 mg/dL (ref 4.5–5.6)

## 2018-02-06 LAB — LACTATE DEHYDROGENASE: LDH: 937 U/L — AB (ref 98–192)

## 2018-02-06 MED ORDER — DIGOXIN 0.25 MG/ML IJ SOLN
0.1250 mg | Freq: Every day | INTRAMUSCULAR | Status: DC
  Administered 2018-02-06 – 2018-02-07 (×2): 0.125 mg via INTRAVENOUS
  Filled 2018-02-06 (×4): qty 2

## 2018-02-06 MED ORDER — IPRATROPIUM-ALBUTEROL 0.5-2.5 (3) MG/3ML IN SOLN
3.0000 mL | Freq: Three times a day (TID) | RESPIRATORY_TRACT | Status: DC
  Administered 2018-02-07 (×3): 3 mL via RESPIRATORY_TRACT
  Filled 2018-02-06 (×3): qty 3

## 2018-02-06 MED ORDER — POTASSIUM CHLORIDE 10 MEQ/50ML IV SOLN
10.0000 meq | INTRAVENOUS | Status: AC
  Administered 2018-02-06 (×2): 10 meq via INTRAVENOUS
  Filled 2018-02-06: qty 50

## 2018-02-06 MED ORDER — IPRATROPIUM-ALBUTEROL 0.5-2.5 (3) MG/3ML IN SOLN: 3.0000 mL | RESPIRATORY_TRACT | Status: DC | PRN

## 2018-02-06 MED ORDER — MAGNESIUM SULFATE 4 GM/100ML IV SOLN
4.0000 g | Freq: Once | INTRAVENOUS | Status: AC
  Administered 2018-02-06: 4 g via INTRAVENOUS
  Filled 2018-02-06: qty 100

## 2018-02-06 NOTE — Progress Notes (Signed)
Name: Brittany [name] MRN: [id number] DOB: Sep 26, 1961    ADMISSION DATE:  02/03/2018 CONSULTATION DATE:  02/03/2018  REFERRING MD :  Dr. Roxy Manns   CHIEF COMPLAINT:  Cardiogenic Shock   HISTORY OF PRESENT ILLNESS:   57 year old female with PMH of Small Cell Lung CA s/p Chemo/Radiation 2016, PAF (Follwed by Dr. Marlou Porch), HTN, Asthma  Presents to ED with reported 2 days of dyspnea, nausea/vomiting, chest tightness, and epigastric pain. Upon arrival tachypneic and tachycardiac. Troponin >30, LA 7, EKG with new left bundle branch block, Code STEMI activated, taken to Cath Lab. In cath lab patient was found to have severe CAD with likely acute occlusion of the ostial left anterior descending coronary artery, while passing wire patient developed acute occlusion of the left circumflex coronary artery which was complicated by the development of cardiogenic shock. Intra-aortic balloon pump placed. Taken to OR for emergent CABG and Impella placement. Remains Intubated post-operatively. PCCM asked to consult.   SIGNIFICANT EVENTS  2/24 > Presents to ED   STUDIES:  CXR 2/24 > No acute  CXR 2/24 > Endotracheal tube is 2 cm above the carina. Swan-Ganz catheter tip is in the central right pulmonary artery. NG tube enters the stomach. Bilateral chest tubes in place. No pneumothorax. Heart is borderline in size. Mild perihilar atelectasis or edema. No effusions.  SUBJECTIVE:  Remains on impella.  Easily agitated with minimal stimulation, still having some ectopy.  Increased pressor needs this am.   VITAL SIGNS: Temp:  [98.4 F (36.9 C)-99.9 F (37.7 C)] 98.4 F (36.9 C) (02/27 1030) Pulse Rate:  [109-121] 109 (02/27 0740) Resp:  [0-33] 20 (02/27 1030) BP: (86-114)/(62-79) 89/69 (02/27 0740) SpO2:  [100 %] 100 % (02/27 1030) Arterial Line BP: (57-131)/(48-85) 81/63 (02/27 1030) FiO2 (%):  [40 %] 40 % (02/27 0830) Weight:  [55.4 kg (122 lb 2.2 oz)] 55.4 kg (122 lb 2.2 oz) (02/27  1030)  PHYSICAL EXAMINATION: General:  Adult female, on vent, critically ill  Neuro:  Sedated, RASS-3, does not follow commands, pupils 2 mm and sluggish  HEENT:  ETT in place  Cardiovascular: sternal dressing, impella in place,  Tachy, ventricular ectopy  Lungs:  Mediastinal and Right Pleural chest tube in place, no wheeze, coarse breath sounds  Abdomen:  Non-distended, active bowel sounds  Musculoskeletal:  -edema  Skin:  Warm, dry   Recent Labs  Lab 02/04/18 0333  02/05/18 0439 02/05/18 1645 02/06/18 0401  NA 143   < > 140 137 134*  K 3.7   < > 3.1* 3.3* 3.9  CL 113*   < > 104 94* 97*  CO2 22  --  23  --  28  BUN 17   < > 14 10 9   CREATININE 0.92   < > 0.93 0.80 0.98  GLUCOSE 122*   < > 167* 164* 175*   < > = values in this interval not displayed.   Recent Labs  Lab 02/04/18 1633  02/05/18 0439 02/05/18 1645 02/06/18 0401  HGB 10.8*   < > 11.3* 11.6* 10.3*  HCT 33.1*   < > 33.6* 34.0* 30.2*  WBC 9.6  --  12.4*  --  12.4*  PLT 144*  --  125*  --  109*   < > = values in this interval not displayed.   Dg Chest Port 1 View  Result Date: 02/06/2018 CLINICAL DATA:  Reason for exam: CHF Nurse states patient had CABG 02/03/2018. She also states that movement of patient  is causing a large drop in blood pressure. Hx of acute systolic CHF 1/60/7371, asthma, HTN, PAF, small cell lung cancer 03/16/2014. IABP insertion 02/03/2018. CABG x3 02/03/2018. EXAM: PORTABLE CHEST 1 VIEW COMPARISON:  02/05/2018 FINDINGS: Endotracheal tube is in place, tip approximately 3.9 centimeters above the carina. Nasogastric tube is off the image beyond the gastroesophageal junction. Mediastinal drain and epicardial pacing leads are present. Bilateral chest tubes are in place. RIGHT IJ Swan-Ganz catheter tip overlies the level of the pulmonary outflow tract. Heart size is mildly enlarged. Status post median sternotomy and CABG. There is perihilar atelectasis and mild interstitial edema. LEFT basilar  atelectasis. No pneumothorax. IMPRESSION: 1. Lines and tubes as described. 2. Perihilar and LEFT LOWER lobe atelectasis and mild interstitial edema. Electronically Signed   By: Nolon Nations M.D.   On: 02/06/2018 10:38   Dg Chest Port 1 View  Result Date: 02/05/2018 CLINICAL DATA:  Congestive heart failure EXAM: PORTABLE CHEST 1 VIEW COMPARISON:  Portable exam 0851 hours compared to 02/04/2018 FINDINGS: Tip of endotracheal tube projects 2.8 cm above carina. Tip of nasogastric tube extends into stomach below extent of exam. RIGHT jugular Swan-Ganz catheter tip projects over proximal RIGHT pulmonary artery. Tip of ABIOMED Impella device via RIGHT arm approach projects over LEFT ventricle. BILATERAL thoracostomy tubes and mediastinal drain noted. Epicardial pacing wires. Enlargement of cardiac silhouette post CABG. Perihilar infiltrates consistent with pulmonary edema extending to bases, slightly increased on LEFT. No gross pleural effusion or pneumothorax. IMPRESSION: Slightly increased pulmonary edema. Electronically Signed   By: Lavonia Dana M.D.   On: 02/05/2018 09:52    ASSESSMENT / PLAN:  57yo female with profound cardiogenic shock s/p emergent CABG and impella placement for NSTEMI/STEMI.   Acute Hypoxic/Hypercarbic Respiratory Failure in setting of Cardiogenic Shock with pulmonary edema  H/O Small Cell Lung CA s/p Chemo/Radiation 2016 Plan  Vent support  F/u CXR  F/u ABG duonebs  No weaning at this time  Chest tubes to suction   Cardiogenic Shock in setting of STEMI s/p CABG, Impella, and Balloon Pump placement  Severe CAD, PAF  Cath showed: LM: normal LAD: 100% proximal LCX 95% ostial RCA ok TEE 02/03/18 LVEF 20%, Mild to Mod AR, Mild/Mod MR, RV normal.   Plan  -Per Cardiology/CardioThoracic Surgery  -Cardiac Monitoring  - wean levophed, epi as able for goal MAP >65  -Trend Troponin  - gentle diuresis per heart failure team  - heparin per pharmacy  - ?trial weaning impella in  next 1-2 days   Anion Gap Metabolic Acidosis in setting of Lactic Acidosis  Plan  -Trend BMP  -Trend LA  -Replace electrolytes as indicated   Acute Blood loss Anemia  Thrombocytopenia  Plan  -Trend CBC  -Maintain Hbg >7  Sedation Needs  Plan  -RASS goal -2 Continue precedex, fentanyl     Nickolas Madrid, NP 02/06/2018  11:06 AM Pager: [phone] or [phone]

## 2018-02-06 NOTE — Progress Notes (Addendum)
[facility] DAILY ICU PROGRESS NOTE                   [address]            [place] [address]          [phone]   3 Days Post-Op Procedure(s) (LRB): INTRAOPERATIVE TRANSESOPHAGEAL ECHOCARDIOGRAM (N/A) CORONARY ARTERY BYPASS GRAFTING (CABG) (N/A) PLACEMENT OF IMPELLA LEFT VENTRICULAR ASSIST DEVICE LD  Total Length of Stay:  LOS: 3 days   Subjective:  Patient remains on vent.  Awakens and follows commands.  Per nursing patient had several runs of NSVT overnight.  She has required an increase in pressor support this morning.   Objective: Vital signs in last 24 hours: Temp:  [98.8 F (37.1 C)-100.2 F (37.9 C)] 98.8 F (37.1 C) (02/27 0730) Pulse Rate:  [109-121] 109 (02/27 0740) Cardiac Rhythm: Sinus tachycardia (02/27 0400) Resp:  [0-21] 20 (02/27 0740) BP: (72-114)/(61-79) 89/69 (02/27 0740) SpO2:  [100 %] 100 % (02/27 0740) Arterial Line BP: (57-131)/(48-89) 87/71 (02/27 0730) FiO2 (%):  [40 %] 40 % (02/27 0740)  Filed Weights   02/03/18 1119 02/04/18 0400 02/05/18 0400  Weight: 116 lb 13.5 oz (53 kg) 129 lb 10.1 oz (58.8 kg) 124 lb 9 oz (56.5 kg)    Weight change:    Hemodynamic parameters for last 24 hours: PAP: (21-35)/(3-16) 28/13 CVP:  [8 mmHg-11 mmHg] 8 mmHg PCWP:  [15 mmHg] 15 mmHg CO:  [3 L/min-4 L/min] 3 L/min CI:  [2 L/min/m2-2.7 L/min/m2] 2 L/min/m2  Intake/Output from previous day: 02/26 0701 - 02/27 0700 In: 3662.9 [I.V.:2790.9; IV Piggyback:650] Out: 1941 [DEYCX:4481; Emesis/NG output:80; Chest Tube:320]  Intake/Output this shift: No intake/output data recorded.  Current Meds: Scheduled Meds: . aspirin EC  325 mg Oral Daily   Or  . aspirin  324 mg Per Tube Daily  . bisacodyl  10 mg Rectal Daily  . chlorhexidine gluconate (MEDLINE KIT)  15 mL Mouth Rinse BID  . Chlorhexidine Gluconate Cloth  6 each Topical Daily  . docusate  100 mg Per Tube BID  . fentaNYL (SUBLIMAZE) injection  50 mcg Intravenous Once  . insulin aspart   0-24 Units Subcutaneous Q4H  . ipratropium-albuterol  3 mL Nebulization Q4H  . mouth rinse  15 mL Mouth Rinse 10 times per day  . pantoprazole (PROTONIX) IV  40 mg Intravenous Q24H  . sodium chloride flush  10-40 mL Intracatheter Q12H  . sodium chloride flush  3 mL Intravenous Q12H   Continuous Infusions: . sodium chloride 10 mL/hr at 02/05/18 1300  . sodium chloride Stopped (02/06/18 0700)  . sodium chloride    . sodium chloride    . sodium chloride    . sodium chloride    . amiodarone 30 mg/hr (02/05/18 2335)  . dexmedetomidine (PRECEDEX) IV infusion 0.5 mcg/kg/hr (02/06/18 0257)  . dextrose 5 % Impella 5.0 Purge solution Stopped (02/04/18 1142)  . EPINEPHrine 4 mg in dextrose 5% 250 mL infusion (16 mcg/mL) 2 mcg/min (02/05/18 2154)  . fentaNYL infusion INTRAVENOUS 175 mcg/hr (02/06/18 0644)  . furosemide (LASIX) infusion 4 mg/hr (02/05/18 2123)  . impella catheter heparin 50 unit/mL in dextrose 5%    . heparin 300 Units/hr (02/06/18 0007)  . insulin (NOVOLIN-R) infusion Stopped (02/04/18 0800)  . lactated ringers    . lactated ringers    . lactated ringers    . magnesium sulfate 1 - 4 g bolus IVPB 4 g (02/06/18 0752)  . midazolam (  VERSED) infusion 1 mg/hr (02/05/18 1300)  . milrinone 0.5 mcg/kg/min (02/06/18 0258)  . nitroGLYCERIN    . norepinephrine (LEVOPHED) Adult infusion 15 mcg/min (02/06/18 0655)  . phenylephrine (NEO-SYNEPHRINE) Adult infusion    . potassium chloride 10 mEq (02/05/18 1204)   PRN Meds:.sodium chloride, sodium chloride, Place/Maintain arterial line **AND** sodium chloride, Place/Maintain arterial line **AND** sodium chloride, acetaminophen **OR** acetaminophen (TYLENOL) oral liquid 160 mg/5 mL, fentaNYL, Influenza vac split quadrivalent PF, lactated ringers, metoprolol tartrate, midazolam, ondansetron (ZOFRAN) IV, pneumococcal 23 valent vaccine, potassium chloride, sodium chloride flush, sodium chloride flush  General appearance: no distress and on vent,  follows commands Heart: regular rate and rhythm Lungs: diminished breath sounds bibasilar Abdomen: soft, non-tender; bowel sounds normal; no masses,  no organomegaly Extremities: edema trace Wound: clean and dry  Lab Results: CBC: Recent Labs    02/05/18 0439 02/05/18 1645 02/06/18 0401  WBC 12.4*  --  12.4*  HGB 11.3* 11.6* 10.3*  HCT 33.6* 34.0* 30.2*  PLT 125*  --  109*   BMET:  Recent Labs    02/05/18 0439 02/05/18 1645 02/06/18 0401  NA 140 137 134*  K 3.1* 3.3* 3.9  CL 104 94* 97*  CO2 23  --  28  GLUCOSE 167* 164* 175*  BUN 14 10 9   CREATININE 0.93 0.80 0.98  CALCIUM 8.1*  --  8.3*    CMET: Lab Results  Component Value Date   WBC 12.4 (H) 02/06/2018   HGB 10.3 (L) 02/06/2018   HCT 30.2 (L) 02/06/2018   PLT 109 (L) 02/06/2018   GLUCOSE 175 (H) 02/06/2018   CHOL 279 (H) 01/01/2014   TRIG 109 01/01/2014   HDL 49 01/01/2014   LDLCALC 208 (H) 01/01/2014   ALT 56 (H) 02/06/2018   AST 93 (H) 02/06/2018   NA 134 (L) 02/06/2018   K 3.9 02/06/2018   CL 97 (L) 02/06/2018   CREATININE 0.98 02/06/2018   BUN 9 02/06/2018   CO2 28 02/06/2018   TSH 0.624 03/11/2014   INR 1.70 02/04/2018      PT/INR:  Recent Labs    02/04/18 0333  LABPROT 19.8*  INR 1.70   Radiology: Dg Chest Port 1 View  Result Date: 02/05/2018 CLINICAL DATA:  Congestive heart failure EXAM: PORTABLE CHEST 1 VIEW COMPARISON:  Portable exam 0851 hours compared to 02/04/2018 FINDINGS: Tip of endotracheal tube projects 2.8 cm above carina. Tip of nasogastric tube extends into stomach below extent of exam. RIGHT jugular Swan-Ganz catheter tip projects over proximal RIGHT pulmonary artery. Tip of ABIOMED Impella device via RIGHT arm approach projects over LEFT ventricle. BILATERAL thoracostomy tubes and mediastinal drain noted. Epicardial pacing wires. Enlargement of cardiac silhouette post CABG. Perihilar infiltrates consistent with pulmonary edema extending to bases, slightly increased on LEFT.  No gross pleural effusion or pneumothorax. IMPRESSION: Slightly increased pulmonary edema. Electronically Signed   By: Lavonia Dana M.D.   On: 02/05/2018 09:52     Assessment/Plan: S/P Procedure(s) (LRB): INTRAOPERATIVE TRANSESOPHAGEAL ECHOCARDIOGRAM (N/A) CORONARY ARTERY BYPASS GRAFTING (CABG) (N/A) PLACEMENT OF IMPELLA LEFT VENTRICULAR ASSIST DEVICE LD  1. CV- NSTEMI, NSR with PVCs, new LBBB on EKG- remaons on Levophed and Milrinone with increased support this morning.  Impella remains in place on P-7- Advanced heart failure is following 2. Pulm- remains on vent, will wean to extubate when appropriate, Chest tubes remain in place, and will stay until patient extubated 3. Renal- creatinine WNL, weight is trending down, lasix was decreased last night for hypotension, remains  on drip at 12m /hr 4. Expected post operative blood loss anemia, mild Hgb at 10.3 5. Thrombocytopenia- plt count has dropped again, down to 109, patient is on Heparin for Impella, will discuss need for HIT panel or if this is expected outcome with Impella in place 6. Dispo- patient stable, continue pressors and impella per AHF, continue diuretics, chest tubes to remain in place while intubated, will discuss need for HIT panel with Dr. ORoxy Manns continue current care     Erin Barrett 02/06/2018 8:27 AM    I have seen and examined the patient and agree with the assessment and plan as outlined.  Occasional episodes of non-sustained WCT associated w/ hypotension and pressure alarms on Impella whenever patient allowed to wake and gets agitated.  Otherwise the patient is overall doing remarkably well under the circumstances, essentially w/ single system organ failure.   Good pulsatility in [place] encouraging but I think it's still probably premature to consider weaning Impella.  PA pressures and CVP remain low w/ stable cardiac output and mixed venous co-ox 66%.  Oxygenation good and CXR clear.  Renal function normal.  I/O's  essentially even yesterday, weight not recorded.  Platelet count down slightly - will need to watch closely.  WBC mildly elevated but no signs of infection.   No bowel sounds on exam - will need to start tube feeds eventually but continue to hold off for now.  Will discuss w/ Advanced Heart Failure team thoughts about trial weaning Impella in 1-2 days.  CRexene Alberts MD 02/06/2018 9:22 AM

## 2018-02-06 NOTE — Progress Notes (Signed)
When patient wakes up, blood pressure is labile. Blood pressure drops to maps of 40, which precipitates  runs of NSVT. P level dialed down and Norepi titrated up to get blood pressure up.

## 2018-02-06 NOTE — Progress Notes (Signed)
Per shift report, patient had three episodes of NSVT followed by hypotension and suction alarms on impella, EKG strips from bedside monitor printed.  Patient experienced two episodes of NSVT followed by hypotension for RN this AM, no impella alarms.  These instances are preceded by stimulation and repositioning.  Discussed with PA Barrett, PA Chalmers Cater, and MD Roxy Manns during morning rounds.  MD Roxy Manns advised RN to minimize repositioning and stimulation.  RN did not decrease sedation as patient is opening eyes spontaneously and following commands but experiencing unstable vital signs with stimulation.

## 2018-02-06 NOTE — Progress Notes (Addendum)
Advanced Heart Failure Rounding Note  PCP-Cardiologist: No primary care provider on file.   Subjective:    Events S/p emergent CABG 02/03/18 IABP removed 02/04/18  Coox 65.7% this am on Norepi 15, Epi 2, and Milrinone 0.5 mcg/kg/min. Creatinine stable. K 3.4   Remains intubated and sedated. Wakes up and intermittently follows commands. Lasix turned down and norepi titrated up overnight with hypotension and PVCs.   Made 3.3 L urine. down 5 lbs on lasix 8 mg/hr. Made 3.6 L UOP yesterday.   IABP removed 02/04/18. Impella at p-7 this am with 4.0 L flow. Turned down to p-4 overnight with PVCs/NSVT but now back up.  Swan Numbers Personally reviewed.  CVP 7 PAP 29/13 (19)       (PAPi 2.2) CO 2.96 CI  1.99  TEE 02/03/18 LVEF 20%, Mild to Mod AR, Mild/Mod MR, RV normal.   Objective:   Weight Range: 124 lb 9 oz (56.5 kg) Body mass index is 23.54 kg/m.   Vital Signs:   Temp:  [99 F (37.2 C)-100.2 F (37.9 C)] 99 F (37.2 C) (02/27 0600) Pulse Rate:  [114-121] 121 (02/27 0000) Resp:  [0-21] 20 (02/27 0600) BP: (72-114)/(61-79) 86/62 (02/26 1819) SpO2:  [100 %] 100 % (02/27 0400) Arterial Line BP: (57-131)/(48-89) 76/58 (02/27 0600) FiO2 (%):  [40 %] 40 % (02/27 0400) Last BM Date: (pta)  Weight change: Filed Weights   02/03/18 1119 02/04/18 0400 02/05/18 0400  Weight: 116 lb 13.5 oz (53 kg) 129 lb 10.1 oz (58.8 kg) 124 lb 9 oz (56.5 kg)    Intake/Output:   Intake/Output Summary (Last 24 hours) at 02/06/2018 0727 Last data filed at 02/06/2018 0700 Gross per 24 hour  Intake 3542.9 ml  Output 3755 ml  Net -212.1 ml    Physical Exam    General: Intubated/sedated.  HEENT: + ETT Neck: Supple. JVP ~8. Carotids 2+ bilat; no bruits. No thyromegaly or nodule noted. Cor: PMI nondisplaced. RRR, + Impella hum. Pleural tubes with continued drainage.  Lungs: Diminished basilar sounds Abdomen: Soft, non-tender, non-distended, no HSM. No bruits or masses. +BS  Extremities:  No cyanosis, clubbing, or rash. Trace ankle edema. Cool to the touch. L groin impella site stable.  Neuro: Intubated/sedated   Telemetry   Sinus tach 110-120s, +PVCs, personally reviewed.   EKG    No new tracings.   Labs    CBC Recent Labs    02/05/18 0439 02/05/18 1645 02/06/18 0401  WBC 12.4*  --  12.4*  NEUTROABS 10.2*  --  9.9*  HGB 11.3* 11.6* 10.3*  HCT 33.6* 34.0* 30.2*  MCV 91.8  --  92.9  PLT 125*  --  329*   Basic Metabolic Panel Recent Labs    02/05/18 0439 02/05/18 1645 02/06/18 0401  NA 140 137 134*  K 3.1* 3.3* 3.9  CL 104 94* 97*  CO2 23  --  28  GLUCOSE 167* 164* 175*  BUN _0 CREATININE 0.93 0.80 0.98  CALCIUM 8.1*  --  8.3*  MG 2.1  --  1.6*   Liver Function Tests Recent Labs    02/05/18 0439 02/06/18 0401  AST 177* 93*  ALT 61* 56*  ALKPHOS 46 52  BILITOT 3.0* 3.2*  PROT 5.6* 5.4*  ALBUMIN 3.3* 3.0*   Recent Labs    02/03/18 0853  LIPASE 34   Cardiac Enzymes Recent Labs    02/03/18 2219 02/04/18 0333 02/04/18 1454  TROPONINI >65.00* >65.00* >65.00*  BNP: BNP (last 3 results) Recent Labs    02/03/18 0853  BNP 3,127.4*    ProBNP (last 3 results) No results for input(s): PROBNP in the last 8760 hours.   D-Dimer Recent Labs    02/03/18 0853  DDIMER 1.07*   Hemoglobin A1C No results for input(s): HGBA1C in the last 72 hours. Fasting Lipid Panel No results for input(s): CHOL, HDL, LDLCALC, TRIG, CHOLHDL, LDLDIRECT in the last 72 hours. Thyroid Function Tests No results for input(s): TSH, T4TOTAL, T3FREE, THYROIDAB in the last 72 hours.  Invalid input(s): FREET3  Other results:   Imaging    Dg Chest Port 1 View  Result Date: 02/05/2018 CLINICAL DATA:  Congestive heart failure EXAM: PORTABLE CHEST 1 VIEW COMPARISON:  Portable exam 0851 hours compared to 02/04/2018 FINDINGS: Tip of endotracheal tube projects 2.8 cm above carina. Tip of nasogastric tube extends into stomach below extent of exam.  RIGHT jugular Swan-Ganz catheter tip projects over proximal RIGHT pulmonary artery. Tip of ABIOMED Impella device via RIGHT arm approach projects over LEFT ventricle. BILATERAL thoracostomy tubes and mediastinal drain noted. Epicardial pacing wires. Enlargement of cardiac silhouette post CABG. Perihilar infiltrates consistent with pulmonary edema extending to bases, slightly increased on LEFT. No gross pleural effusion or pneumothorax. IMPRESSION: Slightly increased pulmonary edema. Electronically Signed   By: Lavonia Dana M.D.   On: 02/05/2018 09:52     Medications:     Scheduled Medications: . aspirin EC  325 mg Oral Daily   Or  . aspirin  324 mg Per Tube Daily  . bisacodyl  10 mg Rectal Daily  . chlorhexidine gluconate (MEDLINE KIT)  15 mL Mouth Rinse BID  . Chlorhexidine Gluconate Cloth  6 each Topical Daily  . docusate  100 mg Per Tube BID  . fentaNYL (SUBLIMAZE) injection  50 mcg Intravenous Once  . insulin aspart  0-24 Units Subcutaneous Q4H  . ipratropium-albuterol  3 mL Nebulization Q4H  . mouth rinse  15 mL Mouth Rinse 10 times per day  . pantoprazole (PROTONIX) IV  40 mg Intravenous Q24H  . sodium chloride flush  10-40 mL Intracatheter Q12H  . sodium chloride flush  3 mL Intravenous Q12H    Infusions: . sodium chloride 10 mL/hr at 02/05/18 1300  . sodium chloride 250 mL (02/05/18 1400)  . sodium chloride    . sodium chloride    . sodium chloride    . sodium chloride    . amiodarone 30 mg/hr (02/05/18 2335)  . dexmedetomidine (PRECEDEX) IV infusion 0.5 mcg/kg/hr (02/06/18 0257)  . dextrose 5 % Impella 5.0 Purge solution Stopped (02/04/18 1142)  . EPINEPHrine 4 mg in dextrose 5% 250 mL infusion (16 mcg/mL) 2 mcg/min (02/05/18 2154)  . fentaNYL infusion INTRAVENOUS 175 mcg/hr (02/06/18 0644)  . furosemide (LASIX) infusion 4 mg/hr (02/05/18 2123)  . impella catheter heparin 50 unit/mL in dextrose 5%    . heparin 300 Units/hr (02/06/18 0007)  . insulin (NOVOLIN-R) infusion  Stopped (02/04/18 0800)  . lactated ringers    . lactated ringers    . lactated ringers    . midazolam (VERSED) infusion 1 mg/hr (02/05/18 1300)  . milrinone 0.5 mcg/kg/min (02/06/18 0258)  . nitroGLYCERIN    . norepinephrine (LEVOPHED) Adult infusion 15 mcg/min (02/06/18 0655)  . phenylephrine (NEO-SYNEPHRINE) Adult infusion    . potassium chloride 10 mEq (02/05/18 1204)    PRN Medications: sodium chloride, sodium chloride, Place/Maintain arterial line **AND** sodium chloride, Place/Maintain arterial line **AND** sodium chloride, acetaminophen **OR** acetaminophen (  TYLENOL) oral liquid 160 mg/5 mL, fentaNYL, Influenza vac split quadrivalent PF, lactated ringers, metoprolol tartrate, midazolam, ondansetron (ZOFRAN) IV, pneumococcal 23 valent vaccine, potassium chloride, sodium chloride flush, sodium chloride flush  Patient Profile   Brittany [name] is a 57 y/o woman with a history of PAF, preveious smoker quit 4 years ago, hypertension, 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 She was admitted with NSTEMI and shock.  Emergent cath 02/03/18 showed LAD 100% stenosed, LCx 95% stenosed. Taken for emergent CABG 02/03/18  Assessment/Plan   1. NSTEMI/STEMI s/p Emergent CABG 02/03/18 - New LBBB on ECG - cath with severe 2v CAD as above with likely severe LV dysfunction/ICM - s/p Emergent CABG 02/03/18.  - On increased pressor support this am. Vagals down when she wakes up.  - Diuresing as below.   2. Acute systolic HF -> cardiogenic shock in setting of #1 - TEE 02/03/18 LVEF 20%, Mild to Mod AR, Mild/Mod MR, RV normal.  - Coox 65.7% on Epi 2, Norepi 14, and milrinone 0.5 mcg/kg/min  - Remains on Impella P-7 with 4 L flow. LDH 937. Plts trending down.  - Volume status improved. CVP 7 - Continue lasix at 4 mg/hr. Turned down with hypotension and PVCs overnight.  - No BB yet with cardiogenic  shock.  - Discussed at VAD MRB 02/04/18 for potential of durable VAD placement.   3. Acute respiratory failure d/t pulmonary edema - Remains intubated and sedated.   4. Tachycardia - Initial ECG looks like sinus tach with new LBBB - Remains in sinus tach by tele.  - No BB yet with cardiogenic shock.  - Goal K > 4.0 and Mg > 2.0. - K improved. Supp Mg with 4 g.   5. PAF - Previously not on AC. - CHA2DS2/VASc is at least 4.  (CHF, Vasc disease, HTN, Female)  - Continue heparin.   6. H/o SCLC - s/p treatment 2015. Lost to f/u since 04/2015. Will need f/u with oncology (Dr. Julien Nordmann) as outpatient. No change.  Pt remains critically ill. Making slow improvement. Volume status improved.   Medication concerns reviewed with patient and pharmacy team. Barriers identified: None at this time.   Length of Stay: 3  Annamaria Helling  02/06/2018, 7:27 AM  Advanced Heart Failure Team Pager [phone] (M-F; 7a - 4p)  Please contact [facility] for night-coverage after hours (4p -7a ) and weekends on [email or web address]  Patient seen and examined with the above-signed Advanced Practice Provider and/or Housestaff. I personally reviewed laboratory data, imaging studies and relevant notes. I independently examined the patient and formulated the important aspects of the plan. I have edited the note to reflect any of my changes or salient points. I have personally discussed the plan with the patient and/or family.  She remains critically ill. Awake on vent. Remains on NE 14-20, Epi 2 and milrinone 0.5. Impella at P-7 with good waveforms and occasional suction events. Swan numbers improved. CVP 7-10. Outputs ok. SVR ~ 1080. BPs soft  Exam: Awake on vent RIJ swan Cor tachy regular. Impella site and sternotomy ok + CTs Ab: soft NT Ext: warm. No c/c/e  Critically ill but end-organ function improved. Goal will be to try and wean impella over next few days. I turned down to P-6. Will turn milrinone  down to 0.25. Turn epi to 1. Stop lasix gtt and bolus with 40IV as needed. Continue IV heparin and amio.  If unable to wean impella will need to consider HM3 VAD but hoping we can avoid this. I will repeat bedside echo in am. Impella waveforms and flows viewed and adjusted personally.   CRITICAL CARE Performed by: Glori Bickers  Total critical care time: 45 minutes  Critical care time was exclusive of separately billable procedures and treating other patients.  Critical care was necessary to treat or prevent imminent or life-threatening deterioration.  Critical care was time spent personally by me (independent of midlevel providers or residents) on the following activities: development of treatment plan with patient and/or surrogate as well as nursing, discussions with consultants, evaluation of patient's response to treatment, examination of patient, obtaining history from patient or surrogate, ordering and performing treatments and interventions, ordering and review of laboratory studies, ordering and review of radiographic studies, pulse oximetry and re-evaluation of patient's condition.  Glori Bickers, MD  11:32 PM

## 2018-02-06 NOTE — Progress Notes (Signed)
ANTICOAGULATION CONSULT NOTE - Follow Up Consult  Pharmacy Consult for Heparin Indication: Impella  Allergies  Allergen Reactions  . Codeine Nausea And Vomiting  . Latex Hives and Other (See Comments)    Skin irritation  . Pineapple Hives  . Iodinated Diagnostic Agents Hives and Rash  . Sulfa Antibiotics Hives and Rash    Patient Measurements: Height: 5\' 1"  (154.9 cm) Weight: 124 lb 9 oz (56.5 kg) IBW/kg (Calculated) : 47.8  Vital Signs: Temp: 98.4 F (36.9 C) (02/27 0945) Temp Source: Core (02/27 0900) BP: 89/69 (02/27 0740) Pulse Rate: 109 (02/27 0740)  Labs: Recent Labs    02/03/18 1730  02/03/18 1900 02/03/18 2219 02/04/18 0333 02/04/18 1454 02/04/18 1633  02/05/18 0439 02/05/18 1645 02/06/18 0401  HGB 10.4*   < > 8.4*  --  11.1*  --  10.8*   < > 11.3* 11.6* 10.3*  HCT 31.9*   < > 25.6*  --  33.3*  --  33.1*   < > 33.6* 34.0* 30.2*  PLT 65*  --  189  --  161  --  144*  --  125*  --  109*  APTT 82*  --  55*  --  30  --   --   --  149*  --  88*  LABPROT 31.5*  --  25.5*  --  19.8*  --   --   --   --   --   --   INR 3.07  --  2.34  --  1.70  --   --   --   --   --   --   CREATININE  --   --   --  0.97 0.92  --  0.97   < > 0.93 0.80 0.98  TROPONINI  --   --   --  >65.00* >65.00* >65.00*  --   --   --   --   --    < > = values in this interval not displayed.    Estimated Creatinine Clearance: 47.8 mL/min (by C-G formula based on SCr of 0.98 mg/dL).   Assessment: 56yof s/p emergent CABG and Impella placement 2/24 for NSTEMI/STEMI and cardiogenic shock. IABP was also placed but pulled 2/25. Heparin purge and systemic heparin started yesterday afternoon. Heparin purge @ 11.2 mL/hr and systemic at 300 units/hr (total ~ 860 units/hr) with ACTs at goal 164-180  seconds. Hgb stable. Platelets with slow trend down - HIT panel ordered. No bleeding reported.  Goal of Therapy:  ACT 160-180 seconds Monitor platelets by anticoagulation protocol: Yes   Plan:  Pharmacy  will follow ACTs along with nursing and assist in heparin management.  Follow up HIT panel.  Deboraha Sprang 02/06/2018,9:53 AM

## 2018-02-06 NOTE — Progress Notes (Signed)
1400 - patient vital signs stable since 1000 with no NSVT and hypotension events.  Patient has been tolerating gentle turns, has been aroused to voice and following commands, and has been alert with family at bedside with no adverse events.  RN attempted to turn patient to assess patient's skin, patient blood pressure became hypertensive and patient grimaced in pain.  RN gave PRN pain medication. Patient then had episode of NSVT and hypotension.  RN unable to assess patient's skin due to unstable vital signs.

## 2018-02-06 NOTE — Progress Notes (Signed)
Patient ID: Brittany [name], female   DOB: Dec 17, 1960, 57 y.o.   MRN: [id number] EVENING ROUNDS NOTE :     [address]       [place] [address]             [phone]                 3 Days Post-Op Procedure(s) (LRB): INTRAOPERATIVE TRANSESOPHAGEAL ECHOCARDIOGRAM (N/A) CORONARY ARTERY BYPASS GRAFTING (CABG) (N/A) PLACEMENT OF IMPELLA LEFT VENTRICULAR ASSIST DEVICE LD  Total Length of Stay:  LOS: 3 days  BP (!) 84/61   Pulse (!) 104   Temp 98.2 F (36.8 C)   Resp 15   Ht 5\' 1"  (1.549 m)   Wt 122 lb 2.2 oz (55.4 kg) Comment: call bell and PM held off of bed  SpO2 100%   BMI 23.08 kg/m   .Intake/Output      02/26 0701 - 02/27 0700 02/27 0701 - 02/28 0700   I.V. (mL/kg) 2790.9 (49.4) 983.4 (17.8)   Blood     Other 222 124.4   NG/GT     IV Piggyback 650 200   Total Intake(mL/kg) 3662.9 (64.8) 1307.8 (23.6)   Urine (mL/kg/hr) 3355 (2.5) 750 (1.1)   Emesis/NG output 80    Chest Tube 320 130   Total Output 3755 880   Net -92.1 +427.8          . sodium chloride 10 mL/hr at 02/06/18 1309  . sodium chloride Stopped (02/06/18 0700)  . sodium chloride    . sodium chloride    . sodium chloride    . sodium chloride    . amiodarone 30 mg/hr (02/06/18 1115)  . dexmedetomidine (PRECEDEX) IV infusion 0.4 mcg/kg/hr (02/06/18 1114)  . dextrose 5 % Impella 5.0 Purge solution Stopped (02/04/18 1142)  . EPINEPHrine 4 mg in dextrose 5% 250 mL infusion (16 mcg/mL) Stopped (02/06/18 1733)  . fentaNYL infusion INTRAVENOUS 175 mcg/hr (02/06/18 0644)  . impella catheter heparin 50 unit/mL in dextrose 5%    . heparin 300 Units/hr (02/06/18 1258)  . insulin (NOVOLIN-R) infusion Stopped (02/04/18 0800)  . lactated ringers    . lactated ringers    . lactated ringers    . midazolam (VERSED) infusion 1 mg/hr (02/06/18 1155)  . milrinone 0.3 mcg/kg/min (02/06/18 1631)  . nitroGLYCERIN    . norepinephrine (LEVOPHED) Adult infusion 22 mcg/min (02/06/18 1834)  .  phenylephrine (NEO-SYNEPHRINE) Adult infusion    . potassium chloride 10 mEq (02/05/18 1204)     Lab Results  Component Value Date   WBC 12.4 (H) 02/06/2018   HGB 10.3 (L) 02/06/2018   HCT 30.2 (L) 02/06/2018   PLT 109 (L) 02/06/2018   GLUCOSE 175 (H) 02/06/2018   CHOL 279 (H) 01/01/2014   TRIG 109 01/01/2014   HDL 49 01/01/2014   LDLCALC 208 (H) 01/01/2014   ALT 56 (H) 02/06/2018   AST 93 (H) 02/06/2018   NA 134 (L) 02/06/2018   K 3.9 02/06/2018   CL 97 (L) 02/06/2018   CREATININE 0.98 02/06/2018   BUN 9 02/06/2018   CO2 28 02/06/2018   TSH 0.624 03/11/2014   INR 1.70 02/04/2018   Now on P6 current 521/412 Neuro intact  Grace Isaac MD  Beeper [phone] Office [phone] 02/06/2018 6:58 PM

## 2018-02-06 NOTE — Progress Notes (Signed)
Nutrition Follow-up  DOCUMENTATION CODES:   Not applicable  INTERVENTION:   -Recommend initiation of enteral nutrition as soon as clinically feasible. Consider initiation of trophic/trickle feedings (with slow titration to goal as tolerated) if concern for ileus. Consider post-pyloric Cortrak tube placement  Tube Feeding Recommendations:  Goal: Vital High Protein @ 55 ml/hr Provides 116 g of protein, 1320 kcals, 1109 mL of free water Meets 98% calorie needs, 105% protein needs  NUTRITION DIAGNOSIS:   Inadequate oral intake related to inability to eat as evidenced by NPO status.  Continues  GOAL:   Patient will meet greater than or equal to 90% of their needs  Not Met  MONITOR:   Vent status, Weight trends, I & O's, Labs  REASON FOR ASSESSMENT:   Ventilator    ASSESSMENT:   Pt with PMH of small cell lung cancer s/p chemotherapy and radiation (2016), HTN, and CHF presents with cardiogenic shock in setting of STEMI. Pt required emergent CABG 02/03/2018 with Impella placement. Pt remains intubated post-operatively.   2/24 CABG x 3, placement of Impella, TEE  Patient is currently intubated on ventilator support MV: 7.6 L/min Temp (24hrs), Avg:99.2 F (37.3 C), Min:98.4 F (36.9 C), Max:99.9 F (37.7 C)  Pt remains on pressors, milrinone drip. Sedated with fentanyl, precedex  NPO day 3 Discussed nutrition poc with MD Owen this AM. Per MD, pt without bowel sounds, high risk of ileus and does not want to begin TF at this time Abdomen soft, no documented BM, OG tube to LIS with minimal output  Labs: sodium 134, BUN/Creatinine wdl Meds: lasix drip, insulin drip, levophed, epinpehrine   Diet Order:  Diet NPO time specified  EDUCATION NEEDS:   Not appropriate for education at this time  Skin:  Skin Assessment: Skin Integrity Issues: Skin Integrity Issues:: Incisions Incisions: chest and R leg  Last BM:  no documented BM  Height:   Ht Readings from Last 1  Encounters:  02/06/18 5' 1" (1.549 m)    Weight:   Wt Readings from Last 1 Encounters:  02/06/18 122 lb 2.2 oz (55.4 kg)    Ideal Body Weight:  47.7 kg  BMI:  Body mass index is 23.08 kg/m.  Estimated Nutritional Needs:   Kcal:  1352  Protein:  80-106 g  Fluid:  >/= 1.5 L/d   Cate Rhodes MS, RD, LDN, CNSC [phone] Pager  [phone] Weekend/On-Call Pager  

## 2018-02-06 NOTE — Progress Notes (Signed)
Spoke with Estill Bamberg, impella rep, regarding when Impella purge cassette is due to be changed.  Per Estill Bamberg, purge cassette to be changed per facility's IV tubing change protocol.  [facility] changes IV tubing every 96 hours, impella placed on 2/24 so purge cassette due to be changed 2/28.  Purge fluid expired, notified for replacement purge fluid and RN will change purge fluid.

## 2018-02-07 ENCOUNTER — Inpatient Hospital Stay (HOSPITAL_COMMUNITY): Payer: BLUE CROSS/BLUE SHIELD

## 2018-02-07 ENCOUNTER — Encounter (HOSPITAL_COMMUNITY): Payer: Self-pay | Admitting: Certified Registered"

## 2018-02-07 DIAGNOSIS — J9600 Acute respiratory failure, unspecified whether with hypoxia or hypercapnia: Secondary | ICD-10-CM

## 2018-02-07 DIAGNOSIS — R570 Cardiogenic shock: Secondary | ICD-10-CM

## 2018-02-07 LAB — CBC WITH DIFFERENTIAL/PLATELET
Basophils Absolute: 0 10*3/uL (ref 0.0–0.1)
Basophils Relative: 0 %
EOS ABS: 0.3 10*3/uL (ref 0.0–0.7)
Eosinophils Relative: 2 %
HCT: 27.9 % — ABNORMAL LOW (ref 36.0–46.0)
HEMOGLOBIN: 9.3 g/dL — AB (ref 12.0–15.0)
LYMPHS ABS: 0.8 10*3/uL (ref 0.7–4.0)
LYMPHS PCT: 7 %
MCH: 30.6 pg (ref 26.0–34.0)
MCHC: 33.3 g/dL (ref 30.0–36.0)
MCV: 91.8 fL (ref 78.0–100.0)
Monocytes Absolute: 1.4 10*3/uL — ABNORMAL HIGH (ref 0.1–1.0)
Monocytes Relative: 12 %
NEUTROS PCT: 79 %
Neutro Abs: 9.1 10*3/uL — ABNORMAL HIGH (ref 1.7–7.7)
Platelets: 82 10*3/uL — ABNORMAL LOW (ref 150–400)
RBC: 3.04 MIL/uL — AB (ref 3.87–5.11)
RDW: 17.6 % — ABNORMAL HIGH (ref 11.5–15.5)
WBC: 11.6 10*3/uL — AB (ref 4.0–10.5)

## 2018-02-07 LAB — POCT I-STAT 3, ART BLOOD GAS (G3+)
BICARBONATE: 26.2 mmol/L (ref 20.0–28.0)
O2 Saturation: 93 %
PCO2 ART: 47.3 mmHg (ref 32.0–48.0)
Patient temperature: 34.8
TCO2: 28 mmol/L (ref 22–32)
pH, Arterial: 7.34 — ABNORMAL LOW (ref 7.350–7.450)
pO2, Arterial: 66 mmHg — ABNORMAL LOW (ref 83.0–108.0)

## 2018-02-07 LAB — LACTATE DEHYDROGENASE: LDH: 717 U/L — ABNORMAL HIGH (ref 98–192)

## 2018-02-07 LAB — COMPREHENSIVE METABOLIC PANEL
ALT: 54 U/L (ref 14–54)
AST: 113 U/L — AB (ref 15–41)
Albumin: 2.7 g/dL — ABNORMAL LOW (ref 3.5–5.0)
Alkaline Phosphatase: 56 U/L (ref 38–126)
Anion gap: 11 (ref 5–15)
BUN: 10 mg/dL (ref 6–20)
CHLORIDE: 96 mmol/L — AB (ref 101–111)
CO2: 25 mmol/L (ref 22–32)
CREATININE: 0.76 mg/dL (ref 0.44–1.00)
Calcium: 8.2 mg/dL — ABNORMAL LOW (ref 8.9–10.3)
GFR calc Af Amer: >60 mL/min (ref >60)
GLUCOSE: 147 mg/dL — AB (ref 65–99)
Potassium: 4.1 mmol/L (ref 3.5–5.1)
Sodium: 132 mmol/L — ABNORMAL LOW (ref 135–145)
Total Bilirubin: 3.2 mg/dL — ABNORMAL HIGH (ref 0.3–1.2)
Total Protein: 4.9 g/dL — ABNORMAL LOW (ref 6.5–8.1)

## 2018-02-07 LAB — BLOOD GAS, ARTERIAL
Acid-Base Excess: 4.5 mmol/L — ABNORMAL HIGH (ref 0.0–2.0)
Bicarbonate: 28.7 mmol/L — ABNORMAL HIGH (ref 20.0–28.0)
Drawn by: 511471
FIO2: 40
O2 Saturation: 98.7 %
PEEP: 5 cmH2O
Patient temperature: 98.6
RATE: 20 resp/min
VT: 380 mL
pCO2 arterial: 44.8 mmHg (ref 32.0–48.0)
pH, Arterial: 7.422 (ref 7.350–7.450)
pO2, Arterial: 123 mmHg — ABNORMAL HIGH (ref 83.0–108.0)

## 2018-02-07 LAB — POCT ACTIVATED CLOTTING TIME
ACTIVATED CLOTTING TIME: 164 s
ACTIVATED CLOTTING TIME: 169 s
Activated Clotting Time: 164 seconds
Activated Clotting Time: 169 seconds
Activated Clotting Time: 164 seconds
Activated Clotting Time: 169 seconds

## 2018-02-07 LAB — GLUCOSE, CAPILLARY
GLUCOSE-CAPILLARY: 106 mg/dL — AB (ref 65–99)
GLUCOSE-CAPILLARY: 124 mg/dL — AB (ref 65–99)
Glucose-Capillary: 160 mg/dL — ABNORMAL HIGH (ref 65–99)
Glucose-Capillary: 139 mg/dL — ABNORMAL HIGH (ref 65–99)
Glucose-Capillary: 124 mg/dL — ABNORMAL HIGH (ref 65–99)
Glucose-Capillary: 123 mg/dL — ABNORMAL HIGH (ref 65–99)
Glucose-Capillary: 137 mg/dL — ABNORMAL HIGH (ref 65–99)

## 2018-02-07 LAB — MAGNESIUM: Magnesium: 2.1 mg/dL (ref 1.7–2.4)

## 2018-02-07 LAB — HEPARIN INDUCED PLATELET AB (HIT ANTIBODY): Heparin Induced Plt Ab: 0.069 OD (ref 0.000–0.400)

## 2018-02-07 LAB — COOXEMETRY PANEL
CARBOXYHEMOGLOBIN: 1.2 % (ref 0.5–1.5)
Carboxyhemoglobin: 1.5 % (ref 0.5–1.5)
METHEMOGLOBIN: 1 % (ref 0.0–1.5)
Methemoglobin: 1.5 % (ref 0.0–1.5)
O2 SAT: 69.4 %
O2 SAT: 61.4 %
TOTAL HEMOGLOBIN: 9.3 g/dL — AB (ref 12.0–16.0)
TOTAL HEMOGLOBIN: 9.5 g/dL — AB (ref 12.0–16.0)

## 2018-02-07 LAB — APTT: aPTT: 90 seconds — ABNORMAL HIGH (ref 24–36)

## 2018-02-07 LAB — CALCIUM, IONIZED: Calcium, Ionized, Serum: 4.7 mg/dL (ref 4.5–5.6)

## 2018-02-07 MED ORDER — NITROGLYCERIN IN D5W 200-5 MCG/ML-% IV SOLN
2.0000 ug/min | INTRAVENOUS | Status: DC
  Filled 2018-02-07: qty 250

## 2018-02-07 MED ORDER — DOPAMINE-DEXTROSE 3.2-5 MG/ML-% IV SOLN
0.0000 ug/kg/min | INTRAVENOUS | Status: DC
  Filled 2018-02-07: qty 250

## 2018-02-07 MED ORDER — SODIUM CHLORIDE 0.9 % IV SOLN
INTRAVENOUS | Status: DC
  Filled 2018-02-07: qty 30

## 2018-02-07 MED ORDER — TRANEXAMIC ACID (OHS) PUMP PRIME SOLUTION
2.0000 mg/kg | INTRAVENOUS | Status: DC
  Filled 2018-02-07: qty 1.06

## 2018-02-07 MED ORDER — PLASMA-LYTE 148 IV SOLN
INTRAVENOUS | Status: DC
  Filled 2018-02-07: qty 2.5

## 2018-02-07 MED ORDER — MAGNESIUM SULFATE 50 % IJ SOLN
40.0000 meq | INTRAMUSCULAR | Status: DC
  Filled 2018-02-07: qty 9.85

## 2018-02-07 MED ORDER — SODIUM CHLORIDE 0.9 % IV SOLN
INTRAVENOUS | Status: DC
  Filled 2018-02-07: qty 1

## 2018-02-07 MED ORDER — TRANEXAMIC ACID 1000 MG/10ML IV SOLN
1.5000 mg/kg/h | INTRAVENOUS | Status: DC
  Filled 2018-02-07: qty 25

## 2018-02-07 MED ORDER — SODIUM CHLORIDE 0.9 % IV SOLN
30.0000 ug/min | INTRAVENOUS | Status: DC
  Filled 2018-02-07: qty 2

## 2018-02-07 MED ORDER — MILRINONE LACTATE IN DEXTROSE 20-5 MG/100ML-% IV SOLN
0.1250 ug/kg/min | INTRAVENOUS | Status: DC
  Filled 2018-02-07: qty 100

## 2018-02-07 MED ORDER — POTASSIUM CHLORIDE 2 MEQ/ML IV SOLN
80.0000 meq | INTRAVENOUS | Status: DC
  Filled 2018-02-07: qty 40

## 2018-02-07 MED ORDER — VITAL HIGH PROTEIN PO LIQD
1000.0000 mL | ORAL | Status: DC
  Administered 2018-02-07: 1000 mL

## 2018-02-07 MED ORDER — VANCOMYCIN HCL IN DEXTROSE 1-5 GM/200ML-% IV SOLN
1000.0000 mg | INTRAVENOUS | Status: DC
  Filled 2018-02-07: qty 200

## 2018-02-07 MED ORDER — SODIUM CHLORIDE 0.9 % IV SOLN
1.5000 g | INTRAVENOUS | Status: DC
  Filled 2018-02-07 (×2): qty 1.5

## 2018-02-07 MED ORDER — SODIUM CHLORIDE 0.9 % IV SOLN
750.0000 mg | INTRAVENOUS | Status: DC
  Filled 2018-02-07: qty 750

## 2018-02-07 MED ORDER — VANCOMYCIN HCL 1000 MG IV SOLR
INTRAVENOUS | Status: DC
  Filled 2018-02-07: qty 1000

## 2018-02-07 MED ORDER — EPINEPHRINE PF 1 MG/ML IJ SOLN
0.0000 ug/min | INTRAMUSCULAR | Status: DC
  Filled 2018-02-07: qty 4

## 2018-02-07 MED ORDER — TRANEXAMIC ACID (OHS) BOLUS VIA INFUSION
15.0000 mg/kg | INTRAVENOUS | Status: DC
  Filled 2018-02-07: qty 798

## 2018-02-07 MED ORDER — DEXMEDETOMIDINE HCL IN NACL 400 MCG/100ML IV SOLN
0.1000 ug/kg/h | INTRAVENOUS | Status: AC
  Administered 2018-02-08: .5 ug/kg/h via INTRAVENOUS
  Filled 2018-02-07: qty 100

## 2018-02-07 NOTE — Progress Notes (Signed)
Gastric Residual 60 mL.

## 2018-02-07 NOTE — Progress Notes (Signed)
Impella purge cassette and fluid changed per protocol.

## 2018-02-07 NOTE — Progress Notes (Signed)
Name: Saveah Bahar MRN: [id number] DOB: Apr 11, 1961    ADMISSION DATE:  02/03/2018 CONSULTATION DATE:  02/03/2018  REFERRING MD :  Dr. Roxy Manns   CHIEF COMPLAINT:  Cardiogenic Shock   HISTORY OF PRESENT ILLNESS:   57 year old female with PMH of Small Cell Lung CA s/p Chemo/Radiation 2016, PAF (Follwed by Dr. Marlou Porch), HTN, Asthma  Presents to ED with reported 2 days of dyspnea, nausea/vomiting, chest tightness, and epigastric pain. Upon arrival tachypneic and tachycardiac. Troponin >30, LA 7, EKG with new left bundle branch block, Code STEMI activated, taken to Cath Lab. In cath lab patient was found to have severe CAD with likely acute occlusion of the ostial left anterior descending coronary artery, while passing wire patient developed acute occlusion of the left circumflex coronary artery which was complicated by the development of cardiogenic shock. Intra-aortic balloon pump placed. Taken to OR for emergent CABG and Impella placement. Remains Intubated post-operatively. PCCM asked to consult.   SIGNIFICANT EVENTS  2/24 > Presents to ED   STUDIES:  CXR 2/24 > No acute  CXR 2/24 > Endotracheal tube is 2 cm above the carina. Swan-Ganz catheter tip is in the central right pulmonary artery. NG tube enters the stomach. Bilateral chest tubes in place. No pneumothorax. Heart is borderline in size. Mild perihilar atelectasis or edema. No effusions.  SUBJECTIVE:  Remains on impella.  Pressor needs improving.  Off epi gtt.  Weaning sedation a bit.  Minimal ectopy.     VITAL SIGNS: Temp:  [98.1 F (36.7 C)-99.1 F (37.3 C)] 99.1 F (37.3 C) (02/28 0930) Pulse Rate:  [101-109] 101 (02/27 1934) Resp:  [0-23] 16 (02/28 0930) BP: (84-95)/(61-70) 84/61 (02/27 1519) SpO2:  [100 %] 100 % (02/28 0930) Arterial Line BP: (51-196)/(34-102) 89/60 (02/28 0930) FiO2 (%):  [40 %] 40 % (02/28 0743) Weight:  [53.2 kg (117 lb 4.6 oz)-55.4 kg (122 lb 2.2 oz)] 53.2 kg (117 lb 4.6 oz) (02/28  0339)  PHYSICAL EXAMINATION: General:  Adult female, on vent, critically ill  Neuro:  Sedated, RASS-1, does not follow commands, opens eyes to loud voice, pupils 2 mm and sluggish  HEENT:  ETT in place  Cardiovascular: sternal dressing, impella in place,  NSR Lungs:  Right Pleural chest tube in place, no wheeze, coarse breath sounds  Abdomen:  Non-distended, active bowel sounds  Musculoskeletal:  -edema  Skin:  Warm, dry   Recent Labs  Lab 02/05/18 0439 02/05/18 1645 02/06/18 0401 02/07/18 0400  NA 140 137 134* 132*  K 3.1* 3.3* 3.9 4.1  CL 104 94* 97* 96*  CO2 23  --  28 25  BUN 14 10 9 10   CREATININE 0.93 0.80 0.98 0.76  GLUCOSE 167* 164* 175* 147*   Recent Labs  Lab 02/05/18 0439 02/05/18 1645 02/06/18 0401 02/07/18 0400  HGB 11.3* 11.6* 10.3* 9.3*  HCT 33.6* 34.0* 30.2* 27.9*  WBC 12.4*  --  12.4* 11.6*  PLT 125*  --  109* 82*   Dg Chest Port 1 View  Result Date: 02/07/2018 CLINICAL DATA:  Hypoxia EXAM: PORTABLE CHEST 1 VIEW COMPARISON:  February 06, 2018 FINDINGS: Endotracheal tube tip is 3.5 cm above the carina. Swan-Ganz catheter tip is in the right main pulmonary artery. Nasogastric tube tip and side port are below the diaphragm. Bilateral chest tubes and mediastinal drain persist without change in position. There are temporary pacemaker wires attached to the right heart. No pneumothorax. There remains consolidation in the right perihilar region without appreciable change.  There is atelectatic change in both bases. Heart size is upper normal with pulmonary vascularity within normal limits on the left at distortion of the pulmonary vascularity on the right, stable. Surgical clips are noted over the left perihilar region, stable. IMPRESSION: Tube and catheter positions as described without pneumothorax. Persistent right perihilar region soft tissue prominence. Question postoperative hemorrhage/hematoma in this area. Areas of atelectatic change in the lower lung zones  remain stable. No new opacity evident. Stable cardiac silhouette. Electronically Signed   By: Lowella Grip III M.D.   On: 02/07/2018 08:06   Dg Chest Port 1 View  Result Date: 02/06/2018 CLINICAL DATA:  Reason for exam: CHF Nurse states patient had CABG 02/03/2018. She also states that movement of patient is causing a large drop in blood pressure. Hx of acute systolic CHF 0/34/7425, asthma, HTN, PAF, small cell lung cancer 03/16/2014. IABP insertion 02/03/2018. CABG x3 02/03/2018. EXAM: PORTABLE CHEST 1 VIEW COMPARISON:  02/05/2018 FINDINGS: Endotracheal tube is in place, tip approximately 3.9 centimeters above the carina. Nasogastric tube is off the image beyond the gastroesophageal junction. Mediastinal drain and epicardial pacing leads are present. Bilateral chest tubes are in place. RIGHT IJ Swan-Ganz catheter tip overlies the level of the pulmonary outflow tract. Heart size is mildly enlarged. Status post median sternotomy and CABG. There is perihilar atelectasis and mild interstitial edema. LEFT basilar atelectasis. No pneumothorax. IMPRESSION: 1. Lines and tubes as described. 2. Perihilar and LEFT LOWER lobe atelectasis and mild interstitial edema. Electronically Signed   By: Nolon Nations M.D.   On: 02/06/2018 10:38    ASSESSMENT / PLAN:  57yo female with profound cardiogenic shock s/p emergent CABG and impella placement for NSTEMI/STEMI.   Acute Hypoxic/Hypercarbic Respiratory Failure in setting of Cardiogenic Shock with pulmonary edema  H/O Small Cell Lung CA s/p Chemo/Radiation 2016 Plan  Vent support  F/u CXR  F/u ABG duonebs  No weaning at this time - hopefully impella out in next day or so  Pleural Chest tubes to suction  Consider SBT and possible gentle PS wean in am if continues to tolerate impella weaning   Cardiogenic Shock in setting of STEMI s/p CABG, Impella, and Balloon Pump placement  Severe CAD, PAF  Cath showed: LM: normal LAD: 100% proximal LCX 95% ostial RCA  ok TEE 02/03/18 LVEF 20%, Mild to Mod AR, Mild/Mod MR, RV normal.   Plan  Per heart failure team, CVTS  Continue levophed - wean as able for goal MAP >65 Remains on milrinone but weaning- co-ox 69  Continue heparin -- monitor platelets  Weaning impella - ?out in next day or so    Anion Gap Metabolic Acidosis in setting of Lactic Acidosis - resolved.  Plan  -Trend BMP  -Trend LA  -Replace electrolytes as indicated   Acute Blood loss Anemia  Thrombocytopenia  Plan  -Trend CBC  -Maintain Hbg >7  Sedation Needs  Plan  -RASS goal -1 Continue precedex, fentanyl, versed  Would wean versed, fent first - discussed with RN     Nickolas Madrid, NP 02/07/2018  10:03 AM Pager: [phone] or [phone]

## 2018-02-07 NOTE — Progress Notes (Signed)
[address]       [place] [address]             [phone]        CARDIOTHORACIC SURGERY PROGRESS NOTE   R4 Days Post-Op Procedure(s) (LRB): INTRAOPERATIVE TRANSESOPHAGEAL ECHOCARDIOGRAM (N/A) CORONARY ARTERY BYPASS GRAFTING (CABG) (N/A) PLACEMENT OF IMPELLA LEFT VENTRICULAR ASSIST DEVICE LD  Subjective: Sedated on vent  Objective: Vital signs: BP Readings from Last 1 Encounters:  02/06/18 (!) 84/61   Pulse Readings from Last 1 Encounters:  02/06/18 (!) 101   Resp Readings from Last 1 Encounters:  02/07/18 18   Temp Readings from Last 1 Encounters:  02/07/18 99.1 F (37.3 C)    Hemodynamics: PAP: (20-38)/(4-16) 35/13 CVP:  [7 mmHg-9 mmHg] 9 mmHg PCWP:  [10 mmHg-13 mmHg] 13 mmHg CO:  [3.1 L/min-3.9 L/min] 3.1 L/min CI:  [2.1 L/min/m2-2.6 L/min/m2] 2.1 L/min/m2  Mixed venous co-ox 69%  Physical Exam:  Rhythm:   sinus  Breath sounds: clear  Heart sounds:  RRR  Incisions:  Dressings dry, intact  Abdomen:  Soft, non-distended, quiet  Extremities:  Warm, well-perfused  Chest tubes:  low volume thin serosanguinous output, no air leak    Intake/Output from previous day: 02/27 0701 - 02/28 0700 In: 2315.2 [I.V.:1850.6; IV Piggyback:200] Out: 6045 [Urine:1114; Chest Tube:290] Intake/Output this shift: Total I/O In: 10.2 [Other:10.2] Out: -   Lab Results:  CBC: Recent Labs    02/06/18 0401 02/07/18 0400  WBC 12.4* 11.6*  HGB 10.3* 9.3*  HCT 30.2* 27.9*  PLT 109* 82*    BMET:  Recent Labs    02/06/18 0401 02/07/18 0400  NA 134* 132*  K 3.9 4.1  CL 97* 96*  CO2 28 25  GLUCOSE 175* 147*  BUN 9 10  CREATININE 0.98 0.76  CALCIUM 8.3* 8.2*     PT/INR:  No results for input(s): LABPROT, INR in the last 72 hours.  CBG (last 3)  Recent Labs    02/06/18 1951 02/06/18 2333 02/07/18 0355  GLUCAP 125* 106* 139*    ABG    Component Value Date/Time   PHART 7.422 02/07/2018 0314   PCO2ART 44.8 02/07/2018 0314   PO2ART 123 (H) 02/07/2018 0314   HCO3 28.7 (H) 02/07/2018 0314   TCO2 27 02/05/2018 1645   ACIDBASEDEF 1.0 02/04/2018 0350   O2SAT 69.4 02/07/2018 0409    CXR: PORTABLE CHEST 1 VIEW  COMPARISON:  February 06, 2018  FINDINGS: Endotracheal tube tip is 3.5 cm above the carina. Swan-Ganz catheter tip is in the right main pulmonary artery. Nasogastric tube tip and side port are below the diaphragm. Bilateral chest tubes and mediastinal drain persist without change in position. There are temporary pacemaker wires attached to the right heart. No pneumothorax.  There remains consolidation in the right perihilar region without appreciable change. There is atelectatic change in both bases. Heart size is upper normal with pulmonary vascularity within normal limits on the left at distortion of the pulmonary vascularity on the right, stable. Surgical clips are noted over the left perihilar region, stable.  IMPRESSION: Tube and catheter positions as described without pneumothorax. Persistent right perihilar region soft tissue prominence. Question postoperative hemorrhage/hematoma in this area. Areas of atelectatic change in the lower lung zones remain stable. No new opacity evident. Stable cardiac silhouette.   Electronically Signed   By: Lowella Grip III M.D.   On: 02/07/2018 08:06  Assessment/Plan: S/P Procedure(s) (LRB): INTRAOPERATIVE TRANSESOPHAGEAL ECHOCARDIOGRAM (N/A) CORONARY ARTERY BYPASS  GRAFTING (CABG) (N/A) PLACEMENT OF IMPELLA LEFT VENTRICULAR ASSIST DEVICE LD  Overall stable POD4  CV:  Impella LVAD functioning well w/ flows 3-4 L/min on P5, still maintaining good cardiac output  PA pressures and CVP relatively low, C.I. 2.2-2.5 and co-ox 69%  Milrinone @ 0.25 Epi off Levophed @ 15  Resp:  O2 sats 100% on FiO2 40%   Renal:  UOP adequate off lasix  Creatinine stable 0.8  Weight +911 mL yesterday, weight down  Neuro:  Grossly  intact  Sedated on vent  Heme:  Expected post op acute blood loss anemia,Hgb down slightly 9.3  Platelet count down slightly 82k  Chest tube output low, no signs of active bleeding  ACT's 164-175 on IV heparin  Plan:  Continue current support  Consider trial weaning Impella at bedside today and possible removal of Impella tomorrow if she does well  Proceed with vent wean/extubation if unable to wean Impella  Start trickle tube feeds for now   Rexene Alberts, MD 02/07/2018 8:11 AM

## 2018-02-07 NOTE — Plan of Care (Signed)
  Clinical Measurements: Will remain free from infection 02/07/2018 0414 - Progressing by Tish Frederickson, RN Diagnostic test results will improve 02/07/2018 0414 - Progressing by Tish Frederickson, RN Respiratory complications will improve 02/07/2018 0414 - Progressing by Tish Frederickson, RN Cardiovascular complication will be avoided 02/07/2018 0414 - Progressing by Tish Frederickson, RN

## 2018-02-07 NOTE — Progress Notes (Signed)
Received phone call from patient's sister Brittany [name] requesting status update.  Update provided.  Patient's sister asked if any one had been to unit to visit patient, RN advised there was one visitor on 2/27 in the afternoon that stated she was patient's best friend, RN could not recall visitors name.  Patient's sister very upset that a non-immediate family member visited the patient and stated to RN that only the patient's sister, niece or son were to visit and anyone outside of this list were only to visit if accompanied by one of the three.  Sign placed on patient's door for all visitors to see RN before entering.   RN clarified the names of approved family member visitors with patient's sister: Brittany [name] (sister) Earl Gala (niece) Odis Luster (son)

## 2018-02-07 NOTE — Progress Notes (Signed)
PAP waveform appearance changed after RN set up new transducer and PA systolic reading 10 points lower than prior readings. No change in cm marking for Swan placement. Waveform appears appropriate to RN.  MD Cyndia Bent on unit, MD Bartle reviewed waveform and confirmed appropriate waveform.

## 2018-02-07 NOTE — Progress Notes (Addendum)
[facility] BRIEF SICU PROGRESS NOTE  4 Days Post-Op  S/P Procedure(s) (LRB): INTRAOPERATIVE TRANSESOPHAGEAL ECHOCARDIOGRAM (N/A) CORONARY ARTERY BYPASS GRAFTING (CABG) (N/A) PLACEMENT OF IMPELLA LEFT VENTRICULAR ASSIST DEVICE LD   Clinically stable w/ Impella weaned down to P3, LVAD flows < 2.5 Hemodynamics remain stable w/ low filling pressures and cardiac index 2.4  Plan: Will tentatively plan to return to OR tomorrow for Impella removal.  I have attempted to reach the patient's sister via telephone to discuss, but no answer on her telephone.  Rexene Alberts, MD 02/07/2018 4:44 PM

## 2018-02-07 NOTE — Progress Notes (Signed)
ANTICOAGULATION CONSULT NOTE - Follow Up Consult  Pharmacy Consult for Heparin Indication: Impella  Allergies  Allergen Reactions  . Codeine Nausea And Vomiting  . Latex Hives and Other (See Comments)    Skin irritation  . Pineapple Hives  . Iodinated Diagnostic Agents Hives and Rash  . Sulfa Antibiotics Hives and Rash    Patient Measurements: Height: 5\' 1"  (154.9 cm) Weight: 117 lb 4.6 oz (53.2 kg)(call bell, PM and blanket held off bed) IBW/kg (Calculated) : 47.8  Vital Signs: Temp: 99 F (37.2 C) (02/28 1045) Temp Source: Core (02/28 1000)  Labs: Recent Labs    02/04/18 1454  02/05/18 0439 02/05/18 1645 02/06/18 0401 02/07/18 0400  HGB  --    < > 11.3* 11.6* 10.3* 9.3*  HCT  --    < > 33.6* 34.0* 30.2* 27.9*  PLT  --    < > 125*  --  109* 82*  APTT  --   --  149*  --  88* 90*  CREATININE  --    < > 0.93 0.80 0.98 0.76  TROPONINI >65.00*  --   --   --   --   --    < > = values in this interval not displayed.    Estimated Creatinine Clearance: 58.5 mL/min (by C-G formula based on SCr of 0.76 mg/dL).   Assessment: 56yof s/p emergent CABG and Impella placement 2/24 for NSTEMI/STEMI and cardiogenic shock. IABP was also placed but pulled 2/25. Heparin purge and systemic heparin started 2/26. Heparin purge @ 11.2 mL/hr and systemic at 300 units/hr (total ~ 860 units/hr) with ACTs at goal 164-175 seconds. Hgb low but stable. Platelets continue to trend down - HIT panel pending. No bleeding reported.  Goal of Therapy:  ACT 160-180 seconds Monitor platelets by anticoagulation protocol: Yes   Plan:  Pharmacy will follow ACTs along with nursing and assist in heparin management.  Follow up HIT panel.  Deboraha Sprang 02/07/2018,11:03 AM

## 2018-02-07 NOTE — Progress Notes (Addendum)
Advanced Heart Failure Rounding Note  PCP-Cardiologist: No primary care provider on file.   Subjective:    Events S/p emergent CABG 02/03/18 IABP removed 02/04/18  Coox 69.4% this am on Milrinone 0.3 mcg/kg/min and Norepi 13.  Creatinine stable.   Intubated and sedated this am  Down another 5 lbs, though made 1.1 L of UOP. Lasix stopped last night.   IABP removed 02/04/18. Impella at p-6 this am with 3.7 L flow.   Swan Numbers, personally reviewed. CVP 8-9 PAP 35/13 CO 3.1 CI  2.1  TEE 02/03/18 LVEF 20%, Mild to Mod AR, Mild/Mod MR, RV normal.   Objective:   Weight Range: 117 lb 4.6 oz (53.2 kg) Body mass index is 22.16 kg/m.   Vital Signs:   Temp:  [98.1 F (36.7 C)-99 F (37.2 C)] 99 F (37.2 C) (02/28 0651) Pulse Rate:  [101-109] 101 (02/27 1934) Resp:  [0-33] 0 (02/28 0651) BP: (84-95)/(61-70) 84/61 (02/27 1519) SpO2:  [100 %] 100 % (02/28 0651) Arterial Line BP: (51-196)/(34-102) 91/63 (02/28 0651) FiO2 (%):  [40 %] 40 % (02/28 0427) Weight:  [117 lb 4.6 oz (53.2 kg)-122 lb 2.2 oz (55.4 kg)] 117 lb 4.6 oz (53.2 kg) (02/28 0339) Last BM Date: (pta)  Weight change: Filed Weights   02/06/18 0500 02/06/18 1030 02/07/18 0339  Weight: 120 lb 5.9 oz (54.6 kg) 122 lb 2.2 oz (55.4 kg) 117 lb 4.6 oz (53.2 kg)    Intake/Output:   Intake/Output Summary (Last 24 hours) at 02/07/2018 0721 Last data filed at 02/07/2018 0700 Gross per 24 hour  Intake 2315.2 ml  Output 1404 ml  Net 911.2 ml    Physical Exam    General: Intubated/sedated.  HEENT: + ETT Neck: Supple. JVP 8-9. Carotids 2+ bilat; no bruits. No thyromegaly or nodule noted. Cor: PMI nondisplaced. RRR, + impella hum.  Lungs: Diminished basilar sounds Abdomen: Soft, non-tender, non-distended, no HSM. No bruits or masses. +BS  Extremities: No cyanosis, clubbing, or rash. Trace ankle edema. Cool to the touch. L groin site stable.  RUE/Axillary impella site stable.  Neuro: Intubated/sedated     Telemetry   NSR 80-90s, + PVCs, personally reviewed.   EKG    No new tracings.  Labs    CBC Recent Labs    02/06/18 0401 02/07/18 0400  WBC 12.4* 11.6*  NEUTROABS 9.9* 9.1*  HGB 10.3* 9.3*  HCT 30.2* 27.9*  MCV 92.9 91.8  PLT 109* 82*   Basic Metabolic Panel Recent Labs    02/06/18 0401 02/07/18 0400  NA 134* 132*  K 3.9 4.1  CL 97* 96*  CO2 28 25  GLUCOSE 175* 147*  BUN 9 10  CREATININE 0.98 0.76  CALCIUM 8.3* 8.2*  MG 1.6* 2.1   Liver Function Tests Recent Labs    02/06/18 0401 02/07/18 0400  AST 93* 113*  ALT 56* 54  ALKPHOS 52 56  BILITOT 3.2* 3.2*  PROT 5.4* 4.9*  ALBUMIN 3.0* 2.7*   No results for input(s): LIPASE, AMYLASE in the last 72 hours. Cardiac Enzymes Recent Labs    02/04/18 1454  TROPONINI >65.00*    BNP: BNP (last 3 results) Recent Labs    02/03/18 0853  BNP 3,127.4*    ProBNP (last 3 results) No results for input(s): PROBNP in the last 8760 hours.   D-Dimer No results for input(s): DDIMER in the last 72 hours. Hemoglobin A1C No results for input(s): HGBA1C in the last 72 hours. Fasting Lipid Panel  No results for input(s): CHOL, HDL, LDLCALC, TRIG, CHOLHDL, LDLDIRECT in the last 72 hours. Thyroid Function Tests No results for input(s): TSH, T4TOTAL, T3FREE, THYROIDAB in the last 72 hours.  Invalid input(s): FREET3  Other results:   Imaging    Dg Chest Port 1 View  Result Date: 02/06/2018 CLINICAL DATA:  Reason for exam: CHF Nurse states patient had CABG 02/03/2018. She also states that movement of patient is causing a large drop in blood pressure. 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 IABP insertion 02/03/2018. CABG x3 02/03/2018. EXAM: PORTABLE CHEST 1 VIEW COMPARISON:  02/05/2018 FINDINGS: Endotracheal tube is in place, tip approximately 3.9 centimeters above the carina. Nasogastric tube is off the image beyond the gastroesophageal junction. Mediastinal  drain and epicardial pacing leads are present. Bilateral chest tubes are in place. RIGHT IJ Swan-Ganz catheter tip overlies the level of the pulmonary outflow tract. Heart size is mildly enlarged. Status post median sternotomy and CABG. There is perihilar atelectasis and mild interstitial edema. LEFT basilar atelectasis. No pneumothorax. IMPRESSION: 1. Lines and tubes as described. 2. Perihilar and LEFT LOWER lobe atelectasis and mild interstitial edema. Electronically Signed   By: Nolon Nations M.D.   On: 02/06/2018 10:38     Medications:     Scheduled Medications: . aspirin EC  325 mg Oral Daily   Or  . aspirin  324 mg Per Tube Daily  . bisacodyl  10 mg Rectal Daily  . chlorhexidine gluconate (MEDLINE KIT)  15 mL Mouth Rinse BID  . Chlorhexidine Gluconate Cloth  6 each Topical Daily  . digoxin  0.125 mg Intravenous Daily  . docusate  100 mg Per Tube BID  . fentaNYL (SUBLIMAZE) injection  50 mcg Intravenous Once  . insulin aspart  0-24 Units Subcutaneous Q4H  . ipratropium-albuterol  3 mL Nebulization TID  . mouth rinse  15 mL Mouth Rinse 10 times per day  . pantoprazole (PROTONIX) IV  40 mg Intravenous Q24H  . sodium chloride flush  10-40 mL Intracatheter Q12H  . sodium chloride flush  3 mL Intravenous Q12H    Infusions: . sodium chloride 10 mL/hr at 02/06/18 1309  . sodium chloride Stopped (02/06/18 0700)  . sodium chloride    . sodium chloride    . sodium chloride    . sodium chloride    . amiodarone 30 mg/hr (02/06/18 2337)  . dexmedetomidine (PRECEDEX) IV infusion 0.5 mcg/kg/hr (02/07/18 1308)  . dextrose 5 % Impella 5.0 Purge solution Stopped (02/04/18 1142)  . EPINEPHrine 4 mg in dextrose 5% 250 mL infusion (16 mcg/mL) Stopped (02/06/18 1733)  . fentaNYL infusion INTRAVENOUS 175 mcg/hr (02/06/18 2149)  . impella catheter heparin 50 unit/mL in dextrose 5%    . heparin 300 Units/hr (02/06/18 1258)  . insulin (NOVOLIN-R) infusion Stopped (02/04/18 0800)  . lactated  ringers    . lactated ringers    . lactated ringers    . midazolam (VERSED) infusion 1 mg/hr (02/06/18 1155)  . milrinone 0.3 mcg/kg/min (02/06/18 2011)  . nitroGLYCERIN    . norepinephrine (LEVOPHED) Adult infusion 13 mcg/min (02/07/18 0516)  . phenylephrine (NEO-SYNEPHRINE) Adult infusion    . potassium chloride 10 mEq (02/05/18 1204)    PRN Medications: sodium chloride, sodium chloride, Place/Maintain arterial line **AND** sodium chloride, Place/Maintain arterial line **AND** sodium chloride, acetaminophen **OR** acetaminophen (TYLENOL) oral liquid 160 mg/5 mL, fentaNYL, Influenza vac split quadrivalent PF, ipratropium-albuterol, lactated ringers, metoprolol tartrate, midazolam, ondansetron (ZOFRAN) IV, pneumococcal  23 valent vaccine, potassium chloride, sodium chloride flush, sodium chloride flush  Patient Profile   Brittany [name] is a 57 y/o woman with a history of PAF, preveious smoker quit 4 years ago, hypertension, 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 She was admitted with NSTEMI and shock.  Emergent cath 02/03/18 showed LAD 100% stenosed, LCx 95% stenosed. Taken for emergent CABG 02/03/18  Assessment/Plan   1. NSTEMI/STEMI s/p Emergent CABG 02/03/18 - New LBBB on ECG - cath with severe 2v CAD as above with likely severe LV dysfunction/ICM - s/p Emergent CABG 02/03/18.  - Remains on Impella as below. Plts down to 82.   2. Acute systolic HF -> cardiogenic shock in setting of #1 - TEE 02/03/18 LVEF 20%, Mild to Mod AR, Mild/Mod MR, RV normal.  - Coox 69.5% on Norepi 13 and Milrinone 0.3 mcg/kg/min  - Impella at p-6 with 3.7 L of flow. Platelets trending down. LDH trending down as well.  - Decrease Milrinone to 0.25 and Impella to P-5.  Titrate Norepi back up as needed.  - Volume status OK for now. No lasix this am while weaning support.  - No BB yet with cardiogenic shock.   - Discussed at VAD MRB 02/04/18 for potential of durable VAD placement.   3. Acute respiratory failure d/t pulmonary edema - Remains intubated and sedated.   4. Tachycardia - Initial ECG looks like sinus tach with new LBBB - Remains in sinus tach by tele.  - No BB yet with cardiogenic shock.  - Goal K > 4.0 and Mg > 2.0. - K and Mg stable this am.   5. PAF - Previously not on AC. - CHA2DS2/VASc is at least 4.  (CHF, Vasc disease, HTN, Female)  - Continue heparin.   6. H/o SCLC - s/p treatment 2015. Lost to f/u since 04/2015. Will need f/u with oncology (Dr. Julien Nordmann) as outpatient. No change.   Medication concerns reviewed with patient and pharmacy team. Barriers identified: None at this time.   Length of Stay: 4  Annamaria Helling  02/07/2018, 7:21 AM  Advanced Heart Failure Team Pager [phone] (M-F; 7a - 4p)  Please contact [facility] for night-coverage after hours (4p -7a ) and weekends on [email or web address]  Agree. She remains awake on vent. EPI weaned off but still on NE and milrinone. Impella at -6. Co-ox stable. Volume status improved. No further VT. No bleeding with heparin. LDH down to 717.. Platelets dropping. Swan numbers stable. Vent at 30%  Exam Intubated awake follows commands RIJ swan Cor RRR impella hum. Sternal wound ok Lungs clear anteriorly Ab soft NT/ND Ext warm no edema  Remains critically ill but improving. Echo done personally at bedside shows EF 25-30% RV ok.  Impella placement ok. Impella parameters reviewed personally. Weaned to P-3 and co-ox stable.   I discussed with Dr. Roxy Manns and will leave Impella at P-3 overnight, If stable overnight, he will plan Impella removal in the am. Continue NE and milrinone.   CRITICAL CARE Performed by: Glori Bickers  Total critical care time: 45 minutes  Critical care time was exclusive of separately billable procedures and treating other patients.  Critical care was necessary to treat or prevent  imminent or life-threatening deterioration.  Critical care was time spent personally by me (independent of midlevel providers or residents) on the following activities: development of treatment plan with patient and/or surrogate as well as nursing, discussions  with consultants, evaluation of patient's response to treatment, examination of patient, obtaining history from patient or surrogate, ordering and performing treatments and interventions, ordering and review of laboratory studies, ordering and review of radiographic studies, pulse oximetry and re-evaluation of patient's condition.  Glori Bickers, MD  12:08 AM

## 2018-02-07 NOTE — Progress Notes (Signed)
25 mL of versed wasted in sink due to medication expired.  Witnessed by Idelia Salm RN

## 2018-02-07 NOTE — Progress Notes (Addendum)
250 mL gastric residual   1700- Spoke with MD Roxy Manns regarding residual volume.  MD Roxy Manns advised to stop tube feeds and restart OG tube to low intermittent suction.

## 2018-02-07 NOTE — Progress Notes (Addendum)
Received phone call from patient's sister Wendall Stade, advised MD Roxy Manns was calling to discuss plan of care and impella removal.  Wendall Stade advised she would be available until she heads to work at [employer] at [phone], confirmed contact # with patient's sister and advised her that RN would page MD and have MD call her.  Patient's sister advised she will be up to unit on 02/08/18 after 0700 when she gets off work.  Spoke with MD Roxy Manns, MD advised will call patient's sister and that RN will need to contact patient's sister for verbal consent to impella removal as patient will be going to OR for removal prior to 0700.     Received return call from MD Roxy Manns that no answer on patient's sisters phone #.  RN confirmed contact number with Wendall Stade two times before paging MD and told patient's sister that MD would be calling.  Confirmed phone number with MD Roxy Manns.  MD Roxy Manns advised will try to reach Wendall Stade again.   1800 - called Wendall Stade, patient's sister, confirmed MD Roxy Manns spoke with Wendall Stade.  Two RNs, [place] Idelia Salm, verified verbal consent.

## 2018-02-08 ENCOUNTER — Encounter (HOSPITAL_COMMUNITY)
Admission: EM | Disposition: A | Discharge status: Skilled Nursing Facility | Payer: Self-pay | Source: Home / Self Care | Attending: Thoracic Surgery (Cardiothoracic Vascular Surgery)

## 2018-02-08 ENCOUNTER — Inpatient Hospital Stay (HOSPITAL_COMMUNITY): Payer: BLUE CROSS/BLUE SHIELD

## 2018-02-08 ENCOUNTER — Other Ambulatory Visit: Payer: Self-pay

## 2018-02-08 ENCOUNTER — Inpatient Hospital Stay (HOSPITAL_COMMUNITY): Payer: BLUE CROSS/BLUE SHIELD | Admitting: Certified Registered"

## 2018-02-08 HISTORY — PX: TEE WITHOUT CARDIOVERSION: SHX5443

## 2018-02-08 HISTORY — PX: REMOVAL OF IMPELLA LEFT VENTRICULAR ASSIST DEVICE: SHX6556

## 2018-02-08 LAB — CBC WITH DIFFERENTIAL/PLATELET
Basophils Absolute: 0 10*3/uL (ref 0.0–0.1)
Basophils Relative: 0 %
Eosinophils Absolute: 0.4 10*3/uL (ref 0.0–0.7)
Eosinophils Relative: 4 %
HCT: 25.6 % — ABNORMAL LOW (ref 36.0–46.0)
Hemoglobin: 8.6 g/dL — ABNORMAL LOW (ref 12.0–15.0)
Lymphocytes Relative: 11 %
Lymphs Abs: 1.2 10*3/uL (ref 0.7–4.0)
MCH: 30.7 pg (ref 26.0–34.0)
MCHC: 33.6 g/dL (ref 30.0–36.0)
MCV: 91.4 fL (ref 78.0–100.0)
Monocytes Absolute: 1.1 10*3/uL — ABNORMAL HIGH (ref 0.1–1.0)
Monocytes Relative: 10 %
Neutro Abs: 8 10*3/uL — ABNORMAL HIGH (ref 1.7–7.7)
Neutrophils Relative %: 75 %
Platelets: 90 10*3/uL — ABNORMAL LOW (ref 150–400)
RBC: 2.8 MIL/uL — ABNORMAL LOW (ref 3.87–5.11)
RDW: 17 % — ABNORMAL HIGH (ref 11.5–15.5)
WBC: 10.7 10*3/uL — ABNORMAL HIGH (ref 4.0–10.5)

## 2018-02-08 LAB — POCT I-STAT, CHEM 8
BUN: 9 mg/dL (ref 6–20)
BUN: 9 mg/dL (ref 6–20)
BUN: 9 mg/dL (ref 6–20)
CALCIUM ION: 1.11 mmol/L — AB (ref 1.15–1.40)
CALCIUM ION: 1.12 mmol/L — AB (ref 1.15–1.40)
CALCIUM ION: 1.15 mmol/L (ref 1.15–1.40)
CHLORIDE: 97 mmol/L — AB (ref 101–111)
CREATININE: 0.5 mg/dL (ref 0.44–1.00)
Chloride: 95 mmol/L — ABNORMAL LOW (ref 101–111)
Chloride: 97 mmol/L — ABNORMAL LOW (ref 101–111)
Creatinine, Ser: 0.5 mg/dL (ref 0.44–1.00)
Creatinine, Ser: 0.6 mg/dL (ref 0.44–1.00)
GLUCOSE: 122 mg/dL — AB (ref 65–99)
Glucose, Bld: 120 mg/dL — ABNORMAL HIGH (ref 65–99)
Glucose, Bld: 131 mg/dL — ABNORMAL HIGH (ref 65–99)
HCT: 25 % — ABNORMAL LOW (ref 36.0–46.0)
HCT: 25 % — ABNORMAL LOW (ref 36.0–46.0)
HEMATOCRIT: 24 % — AB (ref 36.0–46.0)
HEMOGLOBIN: 8.2 g/dL — AB (ref 12.0–15.0)
Hemoglobin: 8.5 g/dL — ABNORMAL LOW (ref 12.0–15.0)
Hemoglobin: 8.5 g/dL — ABNORMAL LOW (ref 12.0–15.0)
Potassium: 4.2 mmol/L (ref 3.5–5.1)
Potassium: 4 mmol/L (ref 3.5–5.1)
Potassium: 4.3 mmol/L (ref 3.5–5.1)
SODIUM: 131 mmol/L — AB (ref 135–145)
SODIUM: 132 mmol/L — AB (ref 135–145)
SODIUM: 134 mmol/L — AB (ref 135–145)
TCO2: 31 mmol/L (ref 22–32)
TCO2: 33 mmol/L — AB (ref 22–32)
TCO2: 25 mmol/L (ref 22–32)

## 2018-02-08 LAB — POCT I-STAT 3, ART BLOOD GAS (G3+)
ACID-BASE DEFICIT: 1 mmol/L (ref 0.0–2.0)
ACID-BASE EXCESS: 8 mmol/L — AB (ref 0.0–2.0)
ACID-BASE EXCESS: 5 mmol/L — AB (ref 0.0–2.0)
ACID-BASE EXCESS: 4 mmol/L — AB (ref 0.0–2.0)
Acid-base deficit: 1 mmol/L (ref 0.0–2.0)
BICARBONATE: 28.4 mmol/L — AB (ref 20.0–28.0)
BICARBONATE: 24 mmol/L (ref 20.0–28.0)
BICARBONATE: 24.4 mmol/L (ref 20.0–28.0)
Bicarbonate: 31.4 mmol/L — ABNORMAL HIGH (ref 20.0–28.0)
Bicarbonate: 28.2 mmol/L — ABNORMAL HIGH (ref 20.0–28.0)
O2 SAT: 100 %
O2 SAT: 97 %
O2 SAT: 98 %
O2 Saturation: 94 %
O2 Saturation: 93 %
PCO2 ART: 42.1 mmHg (ref 32.0–48.0)
PH ART: 7.367 (ref 7.350–7.450)
PH ART: 7.369 (ref 7.350–7.450)
PO2 ART: 102 mmHg (ref 83.0–108.0)
PO2 ART: 100 mmHg (ref 83.0–108.0)
Patient temperature: 37.3
TCO2: 32 mmol/L (ref 22–32)
TCO2: 29 mmol/L (ref 22–32)
TCO2: 30 mmol/L (ref 22–32)
TCO2: 25 mmol/L (ref 22–32)
TCO2: 26 mmol/L (ref 22–32)
pCO2 arterial: 35.9 mmHg (ref 32.0–48.0)
pCO2 arterial: 42.2 mmHg (ref 32.0–48.0)
pCO2 arterial: 43.3 mmHg (ref 32.0–48.0)
pCO2 arterial: 41.9 mmHg (ref 32.0–48.0)
pH, Arterial: 7.55 — ABNORMAL HIGH (ref 7.350–7.450)
pH, Arterial: 7.45 (ref 7.350–7.450)
pH, Arterial: 7.424 (ref 7.350–7.450)
pO2, Arterial: 340 mmHg — ABNORMAL HIGH (ref 83.0–108.0)
pO2, Arterial: 76 mmHg — ABNORMAL LOW (ref 83.0–108.0)
pO2, Arterial: 66 mmHg — ABNORMAL LOW (ref 83.0–108.0)

## 2018-02-08 LAB — CBC
HCT: 23.7 % — ABNORMAL LOW (ref 36.0–46.0)
HCT: 28.4 % — ABNORMAL LOW (ref 36.0–46.0)
HEMATOCRIT: 22.9 % — AB (ref 36.0–46.0)
HEMOGLOBIN: 8 g/dL — AB (ref 12.0–15.0)
HEMOGLOBIN: 9.7 g/dL — AB (ref 12.0–15.0)
Hemoglobin: 7.9 g/dL — ABNORMAL LOW (ref 12.0–15.0)
MCH: 31.6 pg (ref 26.0–34.0)
MCH: 30.7 pg (ref 26.0–34.0)
MCH: 31.3 pg (ref 26.0–34.0)
MCHC: 34.5 g/dL (ref 30.0–36.0)
MCHC: 33.8 g/dL (ref 30.0–36.0)
MCHC: 34.2 g/dL (ref 30.0–36.0)
MCV: 91.6 fL (ref 78.0–100.0)
MCV: 90.8 fL (ref 78.0–100.0)
MCV: 91.6 fL (ref 78.0–100.0)
PLATELETS: 104 10*3/uL — AB (ref 150–400)
PLATELETS: 118 10*3/uL — AB (ref 150–400)
Platelets: 80 10*3/uL — ABNORMAL LOW (ref 150–400)
RBC: 2.5 MIL/uL — ABNORMAL LOW (ref 3.87–5.11)
RBC: 2.61 MIL/uL — AB (ref 3.87–5.11)
RBC: 3.1 MIL/uL — AB (ref 3.87–5.11)
RDW: 16.9 % — AB (ref 11.5–15.5)
RDW: 16.9 % — ABNORMAL HIGH (ref 11.5–15.5)
RDW: 16.2 % — ABNORMAL HIGH (ref 11.5–15.5)
WBC: 9.6 10*3/uL (ref 4.0–10.5)
WBC: 11.6 10*3/uL — AB (ref 4.0–10.5)
WBC: 12.7 10*3/uL — AB (ref 4.0–10.5)

## 2018-02-08 LAB — COOXEMETRY PANEL
Carboxyhemoglobin: 1.3 % (ref 0.5–1.5)
Methemoglobin: 1.5 % (ref 0.0–1.5)
O2 Saturation: 69 %
Total hemoglobin: 8.4 g/dL — ABNORMAL LOW (ref 12.0–16.0)

## 2018-02-08 LAB — COMPREHENSIVE METABOLIC PANEL
ALK PHOS: 60 U/L (ref 38–126)
ALT: 45 U/L (ref 14–54)
AST: 85 U/L — AB (ref 15–41)
Albumin: 2.3 g/dL — ABNORMAL LOW (ref 3.5–5.0)
Anion gap: 9 (ref 5–15)
BILIRUBIN TOTAL: 3.5 mg/dL — AB (ref 0.3–1.2)
BUN: 9 mg/dL (ref 6–20)
CO2: 25 mmol/L (ref 22–32)
CREATININE: 0.66 mg/dL (ref 0.44–1.00)
Calcium: 8 mg/dL — ABNORMAL LOW (ref 8.9–10.3)
Chloride: 98 mmol/L — ABNORMAL LOW (ref 101–111)
GFR calc Af Amer: >60 mL/min (ref >60)
GFR calc non Af Amer: >60 mL/min (ref >60)
Glucose, Bld: 117 mg/dL — ABNORMAL HIGH (ref 65–99)
POTASSIUM: 4.2 mmol/L (ref 3.5–5.1)
Sodium: 132 mmol/L — ABNORMAL LOW (ref 135–145)
TOTAL PROTEIN: 4.7 g/dL — AB (ref 6.5–8.1)

## 2018-02-08 LAB — GLUCOSE, CAPILLARY
Glucose-Capillary: 126 mg/dL — ABNORMAL HIGH (ref 65–99)
Glucose-Capillary: 85 mg/dL (ref 65–99)

## 2018-02-08 LAB — BLOOD GAS, ARTERIAL
ACID-BASE EXCESS: 3.9 mmol/L — AB (ref 0.0–2.0)
BICARBONATE: 27.6 mmol/L (ref 20.0–28.0)
FIO2: 40
LHR: 20 {breaths}/min
MECHVT: 380 mL
O2 SAT: 98.9 %
PATIENT TEMPERATURE: 98.6
PCO2 ART: 39.6 mmHg (ref 32.0–48.0)
PEEP/CPAP: 5 cmH2O
PH ART: 7.458 — AB (ref 7.350–7.450)
PO2 ART: 131 mmHg — AB (ref 83.0–108.0)

## 2018-02-08 LAB — POCT I-STAT 4, (NA,K, GLUC, HGB,HCT)
GLUCOSE: 122 mg/dL — AB (ref 65–99)
HEMATOCRIT: 22 % — AB (ref 36.0–46.0)
Hemoglobin: 7.5 g/dL — ABNORMAL LOW (ref 12.0–15.0)
POTASSIUM: 3.6 mmol/L (ref 3.5–5.1)
Sodium: 136 mmol/L (ref 135–145)

## 2018-02-08 LAB — BASIC METABOLIC PANEL
ANION GAP: 11 (ref 5–15)
BUN: 10 mg/dL (ref 6–20)
CHLORIDE: 97 mmol/L — AB (ref 101–111)
CO2: 24 mmol/L (ref 22–32)
Calcium: 8.2 mg/dL — ABNORMAL LOW (ref 8.9–10.3)
Creatinine, Ser: 0.79 mg/dL (ref 0.44–1.00)
Glucose, Bld: 215 mg/dL — ABNORMAL HIGH (ref 65–99)
POTASSIUM: 3.8 mmol/L (ref 3.5–5.1)
SODIUM: 132 mmol/L — AB (ref 135–145)

## 2018-02-08 LAB — APTT
aPTT: 63 seconds — ABNORMAL HIGH (ref 24–36)
aPTT: 40 seconds — ABNORMAL HIGH (ref 24–36)

## 2018-02-08 LAB — MAGNESIUM
MAGNESIUM: 2.9 mg/dL — AB (ref 1.7–2.4)
Magnesium: 1.8 mg/dL (ref 1.7–2.4)

## 2018-02-08 LAB — PROTIME-INR
INR: 1.28
Prothrombin Time: 15.9 seconds — ABNORMAL HIGH (ref 11.4–15.2)

## 2018-02-08 LAB — PREPARE RBC (CROSSMATCH)

## 2018-02-08 LAB — POCT ACTIVATED CLOTTING TIME: ACTIVATED CLOTTING TIME: 175 s

## 2018-02-08 LAB — CALCIUM, IONIZED: CALCIUM, IONIZED, SERUM: 4.6 mg/dL (ref 4.5–5.6)

## 2018-02-08 LAB — LACTATE DEHYDROGENASE: LDH: 552 U/L — ABNORMAL HIGH (ref 98–192)

## 2018-02-08 SURGERY — REMOVAL, CARDIAC ASSIST DEVICE, IMPELLA
Anesthesia: General

## 2018-02-08 MED ORDER — 0.9 % SODIUM CHLORIDE (POUR BTL) OPTIME
TOPICAL | Status: DC | PRN
  Administered 2018-02-08: 2000 mL

## 2018-02-08 MED ORDER — ROCURONIUM BROMIDE 100 MG/10ML IV SOLN
INTRAVENOUS | Status: DC | PRN
  Administered 2018-02-08: 50 mg via INTRAVENOUS
  Administered 2018-02-08: 20 mg via INTRAVENOUS

## 2018-02-08 MED ORDER — METOPROLOL TARTRATE 5 MG/5ML IV SOLN: 2.5000 mg | INTRAVENOUS | Status: DC | PRN

## 2018-02-08 MED ORDER — SODIUM CHLORIDE 0.9 % IV SOLN: 250.0000 mL | INTRAVENOUS | Status: DC

## 2018-02-08 MED ORDER — LACTATED RINGERS IV SOLN
INTRAVENOUS | Status: DC
  Administered 2018-02-08: 10:00:00 via INTRAVENOUS

## 2018-02-08 MED ORDER — ASPIRIN EC 325 MG PO TBEC
325.0000 mg | DELAYED_RELEASE_TABLET | Freq: Every day | ORAL | Status: DC
  Administered 2018-02-09 – 2018-02-10 (×2): 325 mg via ORAL
  Filled 2018-02-08 (×2): qty 1

## 2018-02-08 MED ORDER — FAMOTIDINE IN NACL 20-0.9 MG/50ML-% IV SOLN
20.0000 mg | Freq: Two times a day (BID) | INTRAVENOUS | Status: AC
  Administered 2018-02-08 (×2): 20 mg via INTRAVENOUS
  Filled 2018-02-08 (×2): qty 50

## 2018-02-08 MED ORDER — ALBUMIN HUMAN 5 % IV SOLN: 250.0000 mL | INTRAVENOUS | Status: AC | PRN

## 2018-02-08 MED ORDER — SODIUM CHLORIDE 0.45 % IV SOLN
INTRAVENOUS | Status: DC | PRN
  Administered 2018-02-08: 13:00:00 via INTRAVENOUS

## 2018-02-08 MED ORDER — ACETAMINOPHEN 500 MG PO TABS
1000.0000 mg | ORAL_TABLET | Freq: Four times a day (QID) | ORAL | Status: AC
  Administered 2018-02-09 – 2018-02-10 (×4): 1000 mg via ORAL
  Filled 2018-02-08 (×4): qty 2

## 2018-02-08 MED ORDER — MAGNESIUM SULFATE 2 GM/50ML IV SOLN: 2.0000 g | Freq: Once | INTRAVENOUS | Status: DC

## 2018-02-08 MED ORDER — MIDAZOLAM HCL 2 MG/2ML IJ SOLN
INTRAMUSCULAR | Status: AC
  Filled 2018-02-08: qty 2

## 2018-02-08 MED ORDER — SODIUM CHLORIDE 0.9 % IV SOLN
Freq: Once | INTRAVENOUS | Status: AC
  Administered 2018-02-08: 16:00:00 via INTRAVENOUS

## 2018-02-08 MED ORDER — CHLORHEXIDINE GLUCONATE 0.12% ORAL RINSE (MEDLINE KIT)
15.0000 mL | Freq: Two times a day (BID) | OROMUCOSAL | Status: DC
  Administered 2018-02-08: 15 mL via OROMUCOSAL

## 2018-02-08 MED ORDER — ORAL CARE MOUTH RINSE
15.0000 mL | Freq: Two times a day (BID) | OROMUCOSAL | Status: DC
  Administered 2018-02-08 – 2018-02-26 (×29): 15 mL via OROMUCOSAL

## 2018-02-08 MED ORDER — BISACODYL 10 MG RE SUPP: 10.0000 mg | Freq: Every day | RECTAL | Status: DC

## 2018-02-08 MED ORDER — SODIUM CHLORIDE 0.9 % IV SOLN
INTRAVENOUS | Status: DC
  Administered 2018-02-08: 10:00:00 via INTRAVENOUS

## 2018-02-08 MED ORDER — SODIUM CHLORIDE 0.9 % IV SOLN
INTRAVENOUS | Status: DC | PRN
  Administered 2018-02-08: 1.5 g via INTRAVENOUS

## 2018-02-08 MED ORDER — NITROGLYCERIN IN D5W 200-5 MCG/ML-% IV SOLN: 0.0000 ug/min | INTRAVENOUS | Status: DC

## 2018-02-08 MED ORDER — VANCOMYCIN HCL 1000 MG IV SOLR
INTRAVENOUS | Status: DC | PRN
  Administered 2018-02-08: 1000 mg

## 2018-02-08 MED ORDER — SODIUM CHLORIDE 0.9 % IV SOLN
1.5000 g | Freq: Two times a day (BID) | INTRAVENOUS | Status: DC
  Filled 2018-02-08: qty 1.5

## 2018-02-08 MED ORDER — SODIUM CHLORIDE 0.9% FLUSH
3.0000 mL | Freq: Two times a day (BID) | INTRAVENOUS | Status: DC
  Administered 2018-02-09 – 2018-02-10 (×2): 3 mL via INTRAVENOUS

## 2018-02-08 MED ORDER — NOREPINEPHRINE BITARTRATE 1 MG/ML IV SOLN
0.0000 ug/min | INTRAVENOUS | Status: DC
  Filled 2018-02-08: qty 16

## 2018-02-08 MED ORDER — MORPHINE SULFATE (PF) 4 MG/ML IV SOLN
1.0000 mg | INTRAVENOUS | Status: DC | PRN
  Administered 2018-02-09 – 2018-02-11 (×5): 2 mg via INTRAVENOUS
  Filled 2018-02-08 (×5): qty 1

## 2018-02-08 MED ORDER — ACETAMINOPHEN 160 MG/5ML PO SOLN: 1000.0000 mg | Freq: Four times a day (QID) | ORAL | Status: DC

## 2018-02-08 MED ORDER — ONDANSETRON HCL 4 MG/2ML IJ SOLN
4.0000 mg | Freq: Four times a day (QID) | INTRAMUSCULAR | Status: DC | PRN
  Administered 2018-02-08 – 2018-02-24 (×8): 4 mg via INTRAVENOUS
  Filled 2018-02-08 (×8): qty 2

## 2018-02-08 MED ORDER — MIDAZOLAM HCL 2 MG/2ML IJ SOLN
INTRAMUSCULAR | Status: DC | PRN
  Administered 2018-02-08: 2 mg via INTRAVENOUS

## 2018-02-08 MED ORDER — MILRINONE LACTATE IN DEXTROSE 20-5 MG/100ML-% IV SOLN
0.2500 ug/kg/min | INTRAVENOUS | Status: DC
  Administered 2018-02-08: 0.25 ug/kg/min via INTRAVENOUS
  Filled 2018-02-08: qty 100

## 2018-02-08 MED ORDER — CHLORHEXIDINE GLUCONATE 0.12 % MT SOLN
15.0000 mL | OROMUCOSAL | Status: AC
  Administered 2018-02-08: 15 mL via OROMUCOSAL

## 2018-02-08 MED ORDER — PANTOPRAZOLE SODIUM 40 MG PO TBEC
40.0000 mg | DELAYED_RELEASE_TABLET | Freq: Every day | ORAL | Status: DC
  Administered 2018-02-10 – 2018-02-27 (×18): 40 mg via ORAL
  Filled 2018-02-08 (×18): qty 1

## 2018-02-08 MED ORDER — TRAMADOL HCL 50 MG PO TABS
50.0000 mg | ORAL_TABLET | ORAL | Status: DC | PRN
  Administered 2018-02-14 – 2018-02-22 (×4): 100 mg via ORAL
  Filled 2018-02-08 (×4): qty 2

## 2018-02-08 MED ORDER — VANCOMYCIN HCL IN DEXTROSE 1-5 GM/200ML-% IV SOLN
1000.0000 mg | Freq: Once | INTRAVENOUS | Status: AC
  Administered 2018-02-08: 1000 mg via INTRAVENOUS
  Filled 2018-02-08: qty 200

## 2018-02-08 MED ORDER — CEFAZOLIN SODIUM-DEXTROSE 2-4 GM/100ML-% IV SOLN
2.0000 g | Freq: Three times a day (TID) | INTRAVENOUS | Status: AC
  Administered 2018-02-08 – 2018-02-10 (×6): 2 g via INTRAVENOUS
  Filled 2018-02-08 (×6): qty 100

## 2018-02-08 MED ORDER — CHLORHEXIDINE GLUCONATE CLOTH 2 % EX PADS
6.0000 | MEDICATED_PAD | Freq: Every day | CUTANEOUS | Status: DC
  Administered 2018-02-08 – 2018-02-14 (×4): 6 via TOPICAL

## 2018-02-08 MED ORDER — LACTATED RINGERS IV SOLN: INTRAVENOUS | Status: DC

## 2018-02-08 MED ORDER — EPINEPHRINE PF 1 MG/ML IJ SOLN
0.5000 ug/min | INTRAVENOUS | Status: DC
  Filled 2018-02-08: qty 4

## 2018-02-08 MED ORDER — BISACODYL 5 MG PO TBEC
10.0000 mg | DELAYED_RELEASE_TABLET | Freq: Every day | ORAL | Status: DC
  Administered 2018-02-09 – 2018-02-14 (×2): 10 mg via ORAL
  Filled 2018-02-08 (×4): qty 2

## 2018-02-08 MED ORDER — ORAL CARE MOUTH RINSE
15.0000 mL | OROMUCOSAL | Status: DC
  Administered 2018-02-08 (×2): 15 mL via OROMUCOSAL

## 2018-02-08 MED ORDER — SODIUM CHLORIDE 0.9% FLUSH
10.0000 mL | Freq: Two times a day (BID) | INTRAVENOUS | Status: DC
  Administered 2018-02-08 (×2): 10 mL

## 2018-02-08 MED ORDER — POTASSIUM CHLORIDE 10 MEQ/50ML IV SOLN
10.0000 meq | INTRAVENOUS | Status: AC
  Administered 2018-02-08 (×3): 10 meq via INTRAVENOUS
  Filled 2018-02-08 (×3): qty 50

## 2018-02-08 MED ORDER — MORPHINE SULFATE (PF) 4 MG/ML IV SOLN: 1.0000 mg | INTRAVENOUS | Status: AC | PRN

## 2018-02-08 MED ORDER — ACETAMINOPHEN 650 MG RE SUPP: 650.0000 mg | Freq: Once | RECTAL | Status: DC

## 2018-02-08 MED ORDER — MAGNESIUM SULFATE 4 GM/100ML IV SOLN
4.0000 g | Freq: Once | INTRAVENOUS | Status: AC
  Administered 2018-02-08: 4 g via INTRAVENOUS
  Filled 2018-02-08: qty 100

## 2018-02-08 MED ORDER — OXYCODONE HCL 5 MG PO TABS
5.0000 mg | ORAL_TABLET | ORAL | Status: DC | PRN
  Administered 2018-02-09 – 2018-02-20 (×8): 10 mg via ORAL
  Administered 2018-02-21 – 2018-02-22 (×4): 5 mg via ORAL
  Administered 2018-02-23: 10 mg via ORAL
  Administered 2018-02-23 – 2018-02-24 (×2): 5 mg via ORAL
  Administered 2018-02-25: 10 mg via ORAL
  Administered 2018-02-25 – 2018-02-27 (×5): 5 mg via ORAL
  Filled 2018-02-08: qty 2
  Filled 2018-02-08 (×2): qty 1
  Filled 2018-02-08: qty 2
  Filled 2018-02-08: qty 1
  Filled 2018-02-08: qty 2
  Filled 2018-02-08 (×3): qty 1
  Filled 2018-02-08 (×2): qty 2
  Filled 2018-02-08: qty 1
  Filled 2018-02-08 (×2): qty 2
  Filled 2018-02-08: qty 1
  Filled 2018-02-08: qty 2
  Filled 2018-02-08 (×2): qty 1
  Filled 2018-02-08 (×2): qty 2
  Filled 2018-02-08: qty 1

## 2018-02-08 MED ORDER — ACETAMINOPHEN 160 MG/5ML PO SOLN: 650.0000 mg | Freq: Once | ORAL | Status: DC

## 2018-02-08 MED ORDER — VANCOMYCIN HCL 1000 MG IV SOLR
INTRAVENOUS | Status: AC
  Filled 2018-02-08: qty 1000

## 2018-02-08 MED ORDER — SODIUM CHLORIDE 0.9 % IV SOLN: Freq: Once | INTRAVENOUS | Status: DC

## 2018-02-08 MED ORDER — FUROSEMIDE 10 MG/ML IJ SOLN
20.0000 mg | Freq: Once | INTRAMUSCULAR | Status: AC
  Administered 2018-02-08: 20 mg via INTRAVENOUS
  Filled 2018-02-08: qty 2

## 2018-02-08 MED ORDER — NOREPINEPHRINE BITARTRATE 1 MG/ML IV SOLN
0.0000 ug/min | INTRAVENOUS | Status: DC
  Filled 2018-02-08: qty 4

## 2018-02-08 MED ORDER — ENOXAPARIN SODIUM 30 MG/0.3ML ~~LOC~~ SOLN
30.0000 mg | SUBCUTANEOUS | Status: DC
  Administered 2018-02-09 – 2018-02-12 (×4): 30 mg via SUBCUTANEOUS
  Filled 2018-02-08 (×4): qty 0.3

## 2018-02-08 MED ORDER — ASPIRIN 81 MG PO CHEW: 324.0000 mg | CHEWABLE_TABLET | Freq: Every day | ORAL | Status: DC

## 2018-02-08 MED ORDER — DOCUSATE SODIUM 100 MG PO CAPS
200.0000 mg | ORAL_CAPSULE | Freq: Every day | ORAL | Status: DC
  Administered 2018-02-09 – 2018-02-14 (×2): 200 mg via ORAL
  Filled 2018-02-08 (×4): qty 2

## 2018-02-08 MED ORDER — LACTATED RINGERS IV SOLN
INTRAVENOUS | Status: DC | PRN
  Administered 2018-02-08: 08:00:00 via INTRAVENOUS

## 2018-02-08 MED ORDER — SODIUM CHLORIDE 0.9% FLUSH: 3.0000 mL | INTRAVENOUS | Status: DC | PRN

## 2018-02-08 MED ORDER — DEXMEDETOMIDINE HCL IN NACL 200 MCG/50ML IV SOLN
0.0000 ug/kg/h | INTRAVENOUS | Status: DC
  Administered 2018-02-08: 0.5 ug/kg/h via INTRAVENOUS
  Filled 2018-02-08: qty 50

## 2018-02-08 MED ORDER — SODIUM CHLORIDE 0.9% FLUSH: 10.0000 mL | INTRAVENOUS | Status: DC | PRN

## 2018-02-08 MED ORDER — MIDAZOLAM HCL 2 MG/2ML IJ SOLN: 2.0000 mg | INTRAMUSCULAR | Status: DC | PRN

## 2018-02-08 SURGICAL SUPPLY — 29 items
ADH SKN CLS APL DERMABOND .7 (GAUZE/BANDAGES/DRESSINGS) ×1
BAG URIMETER BARDEX IC 350 (UROLOGICAL SUPPLIES) ×2 IMPLANT
BLADE SURG 11 STRL SS (BLADE) ×1 IMPLANT
CANISTER SUCT 3000ML PPV (MISCELLANEOUS) ×2 IMPLANT
CLIP VESOCCLUDE SM WIDE 24/CT (CLIP) ×1 IMPLANT
CONT SPEC 4OZ CLIKSEAL STRL BL (MISCELLANEOUS) ×2 IMPLANT
DERMABOND ADVANCED (GAUZE/BANDAGES/DRESSINGS) ×1
DERMABOND ADVANCED .7 DNX12 (GAUZE/BANDAGES/DRESSINGS) IMPLANT
DRAIN CHANNEL 32F RND 10.7 FF (WOUND CARE) ×2 IMPLANT
DRAPE LAPAROSCOPIC ABDOMINAL (DRAPES) ×2 IMPLANT
ELECT REM PT RETURN 9FT ADLT (ELECTROSURGICAL) ×4
ELECTRODE REM PT RTRN 9FT ADLT (ELECTROSURGICAL) ×2 IMPLANT
GLOVE ORTHO TXT STRL SZ7.5 (GLOVE) ×4 IMPLANT
GOWN STRL REUS W/ TWL LRG LVL3 (GOWN DISPOSABLE) ×2 IMPLANT
GOWN STRL REUS W/TWL LRG LVL3 (GOWN DISPOSABLE) ×4
KIT BASIN OR (CUSTOM PROCEDURE TRAY) ×2 IMPLANT
KIT ROOM TURNOVER OR (KITS) ×2 IMPLANT
NS IRRIG 1000ML POUR BTL (IV SOLUTION) ×4 IMPLANT
PACK CHEST (CUSTOM PROCEDURE TRAY) ×2 IMPLANT
PAD ARMBOARD 7.5X6 YLW CONV (MISCELLANEOUS) ×4 IMPLANT
RELOAD TRI 2.0 30 VAS MED SUL (STAPLE) ×1 IMPLANT
STAPLER ENDO GIA 12 SHRT THIN (STAPLE) IMPLANT
STAPLER ENDO GIA 12MM SHORT (STAPLE) ×2 IMPLANT
SUT PROLENE 4 0 RB 1 (SUTURE) ×2
SUT PROLENE 4-0 RB1 .5 CRCL 36 (SUTURE) IMPLANT
SUT SILK 2 0 SH CR/8 (SUTURE) ×2 IMPLANT
SUT VIC AB 3-0 SH 8-18 (SUTURE) ×2 IMPLANT
TOWEL GREEN STERILE FF (TOWEL DISPOSABLE) ×2 IMPLANT
WATER STERILE IRR 1000ML POUR (IV SOLUTION) ×2 IMPLANT

## 2018-02-08 NOTE — Progress Notes (Signed)
      [address]       [place] [address]             [phone]     CARDIOTHORACIC SURGERY PROGRESS NOTE  Subjective: Brittany [name] has been scheduled for Procedure(s) with comments: INTRAOPERATIVE TRANSESOPHAGEAL ECHOCARDIOGRAM (N/A) CORONARY ARTERY BYPASS GRAFTING (CABG) (N/A) - Time 3 using left internal mammary artery and endoscopically harvested right saphenous vein PLACEMENT OF IMPELLA LEFT VENTRICULAR ASSIST DEVICE LD today.    Objective: Vital signs in last 24 hours: Temp:  [98.4 F (36.9 C)-99.5 F (37.5 C)] 99.5 F (37.5 C) (03/01 0455) Pulse Rate:  [86-98] 86 (03/01 0353) Cardiac Rhythm: Normal sinus rhythm (03/01 0400) Resp:  [0-28] 20 (03/01 0455) BP: (88-108)/(49-54) 108/49 (03/01 0353) SpO2:  [99 %-100 %] 100 % (03/01 0455) Arterial Line BP: (72-161)/(41-115) 112/55 (03/01 0455) FiO2 (%):  [40 %] 40 % (03/01 0353) Weight:  [120 lb 9.5 oz (54.7 kg)] 120 lb 9.5 oz (54.7 kg) (03/01 0455)  Physical Exam: Unchanged from previously   Intake/Output from previous day: 02/28 0701 - 03/01 0700 In: 2008.9 [I.V.:1514.1; NG/GT:237.7] Out: 1408 [Urine:918; Emesis/NG output:250; Chest Tube:240] Intake/Output this shift: Total I/O In: 762.1 [I.V.:660.9; Other:101.2] Out: 628 [Urine:548; Chest Tube:80]  Lab Results: Recent Labs    02/07/18 0400 02/08/18 0322  WBC 11.6* 10.7*  HGB 9.3* 8.6*  HCT 27.9* 25.6*  PLT 82* 90*   BMET:  Recent Labs    02/06/18 0401 02/07/18 0400  NA 134* 132*  K 3.9 4.1  CL 97* 96*  CO2 28 25  GLUCOSE 175* 147*  BUN 9 10  CREATININE 0.98 0.76  CALCIUM 8.3* 8.2*    CBG (last 3)  Recent Labs    02/07/18 2006 02/08/18 0033 02/08/18 0341  GLUCAP 124* 85 126*   PT/INR:  No results for input(s): LABPROT, INR in the last 72 hours.  Assessment/Plan:   The various methods of treatment have been discussed with the patient's sister over the telephone. After consideration of the risks, benefits  and treatment options the patient has consented to the planned procedure.   The patient has been seen and labs reviewed.  She has remained hemodynamically stable overnight w/ Impella settings down to P3.  Cardiac output normal and mixed venous co-ox 69%, PA pressures remain low.  There are no changes in the patient's condition to prevent proceeding with the planned procedure today.   Rexene Alberts, MD 02/08/2018 6:22 AM

## 2018-02-08 NOTE — Procedures (Signed)
Extubation Procedure Note  Patient Details:   Name: Brittany [name] DOB: 27-Nov-1961 MRN: [id number]   Airway Documentation:     Evaluation  O2 sats: stable throughout Complications: No apparent complications Patient did tolerate procedure well. Bilateral Breath Sounds: (coarse)   Yes   Patient extubated per protocol.  NIF of -34, VC of 1.2L with good patient effort.  Positive cuff leak noted.  No evidence of stridor.  Patient able to speak post extubation.  Sats 96%.  Vitals are stable.  No complications noted.   Alphia Moh N 02/08/2018, 4:11 PM

## 2018-02-08 NOTE — Progress Notes (Signed)
Impella out, cardiology and Thoracic surgery managing care, CCM will not routinely follow, please reconsult if needed.

## 2018-02-08 NOTE — Progress Notes (Signed)
      [address]       [place] [address]             [phone]      Impella removed earlier. Extubated  BP 117/68   Pulse (!) 104   Temp 97.9 F (36.6 C)   Resp (!) 25   Ht 5\' 1"  (1.549 m)   Wt 120 lb 9.5 oz (54.7 kg)   SpO2 98%   BMI 22.79 kg/m   25/17 CI= 2.81   Intake/Output Summary (Last 24 hours) at 02/08/2018 1810 Last data filed at 02/08/2018 1800 Gross per 24 hour  Intake 2061.67 ml  Output 1148 ml  Net 913.67 ml   K= 4.3, creatinine 0.6 Hct=24  Doing well post impella removal  Remo Lipps C. Roxan Hockey, MD [facility] [phone]

## 2018-02-08 NOTE — Op Note (Signed)
CARDIOTHORACIC SURGERY OPERATIVE NOTE  Date of Procedure:   02/08/2018  Preoperative Diagnosis:  Cardiogenic Shock - resolved  Postoperative Diagnosis:  same  Procedure:    Removal of Impella Left Ventricular Assist Device  Surgeon:    Valentina Gu. Roxy Manns, MD  Assistant:    Alcide Evener, CRNFA  Anesthesia:    Albertha Ghee, MD  Operative Findings:   Improved left ventricular systolic function with compensated hemodynamics  Moderate to severe residual left ventricular systolic dysfunction, ejection fraction estimated 20-25%  Moderate to severe hypokinesis of the distal anterior wall and anterior septum  Normal wall motion of the lateral wall and inferior wall of the left ventricle  Moderate (2+) central aortic insufficiency (unchanged)  Trace central mitral regurgitation  Normal right ventricular size and systolic function     BRIEF CLINICAL NOTE AND INDICATIONS FOR SURGERY  Patient is a 57 year old female with no previous history of coronary artery disease but history of paroxysmal atrial fibrillation, hypertension, small cell lung cancer treated with definitive chemotherapy and mediastinal radiation therapy followed by prophylactic radiation therapy to the brain who presented to the emergency department at [facility] early today with several day history of worsening chest tightness and shortness of breath. In the emergency department she was noted to be tachycardic with new left bundle branch block on EKG. Troponin level was severely elevated and greater than 30. Code STEMI was called and the patient was taken directly to the cardiac Cath Lab at [facility] by Dr. Martin[place] where she was found to have severe coronary artery disease with likely acute occlusion of the ostial left anterior descending coronary artery. With attempts to pass a wire for percutaneous coronary intervention the patient suddenly developed acute occlusion of the left  circumflex coronary artery which was complicated by the development of acute cardiogenic shock with hypotension. Balloon angioplasty of left circumflex coronary artery was accomplished successfully to reestablish flow in the left circumflex distribution. Emergency cardiothoracic surgical consultation was requested. The patient was emergently intubated by the anesthesia team because of acute hypoxemic respiratory failure. An intra-aortic balloon pump was placed.  The patient was taken directly from the cath lab to the operating room for emergency surgical revascularization.  She underwent emergency coronary artery bypass grafting x3.  Implantation of Impella LD left ventricular assist device via direct aortic approach was required due to cardiogenic shock secondary to profound left ventricular systolic dysfunction complicated by the presence of pre-existing moderate (2+) central aortic insufficiency.  The patient's subsequent course has been remarkably stable and notable for improvement in left ventricular systolic function.  The patient is felt as to be stable enough for removal of mechanical circulatory support.      DETAILS OF THE OPERATIVE PROCEDURE  The patient is brought to the operating room on the above mentioned date and placed in the supine position on the operating table.  General endotracheal anesthesia is maintained under the care and direction of Dr. Marcie Bal.  Transesophageal echocardiogram is performed and demonstrates considerable improvement in left ventricular systolic function.  Specifically, the distal anterior wall and anterior septum of the left ventricle are hypokinetic but notably contracting, in dramatic contrast to the presence of severe akinesis noted at the time of the patient's original presentation.  The patient's lateral wall is moving well.  The inferior wall moves normally.  Right ventricular size and function is normal.  There remains moderate central aortic insufficiency.   There is trace to mild central mitral regurgitation.  Pulmonary artery pressures are relatively low.  The patient's pre-existing chest tubes are removed and the anterior chest is prepared and draped in a sterile manner.   Intravenous antibiotics were administered.  The top 2 cm of the patient's previous median sternotomy incision is opened at the level of the sternal notch.  The Hemashield graft was placed for insertion is exposed at the sternal notch Impella.  The Impella flow rates are decreased and the catheter pulled back out of the left ventricle across the aortic valve.  Patient is noted to remain hemodynamically stable.  The Impella pump is subsequently pulled all of the way out in the vascular graft clamped.  The graft is transected at the level of the sternal notch with a vascular stapler.  Meticulous hemostasis was ascertained.  A small incision at the sternal notch is closed in multiple layers.  The graft exit site incision is closed in multiple layers.  The patient tolerated the procedure well and remained hemodynamically stable throughout.  The patient is transported back to the surgical intensive care unit in stable condition.  There were no intraoperative complications.  Estimated blood loss was trivial.    Valentina Gu. Roxy Manns MD 02/08/2018 9:11 AM

## 2018-02-08 NOTE — Progress Notes (Addendum)
Advanced Heart Failure Rounding Note  PCP-Cardiologist: No primary care provider on file.   Subjective:    Events S/p emergent CABG 02/03/18 IABP removed 02/04/18  Coox 69.0% this am on milrinone 0.25 mcg/kg/min and Norepi 12.   Impella at p-3 with approx 2.1 L flow.   Intubated but awake. CVP 7-8. 930 cc of UOP. I/o positive over all.   Swan Numbers, personally reviewed. CVP 7-8 cm PAP 31/13 (20) CO 4.6 CI  3.1  TEE 02/03/18 LVEF 20%, Mild to Mod AR, Mild/Mod MR, RV normal.   Objective:   Weight Range: 120 lb 9.5 oz (54.7 kg) Body mass index is 22.79 kg/m.   Vital Signs:   Temp:  [98.4 F (36.9 C)-99.5 F (37.5 C)] 99.5 F (37.5 C) (03/01 0455) Pulse Rate:  [86-98] 86 (03/01 0353) Resp:  [0-28] 20 (03/01 0455) BP: (88-108)/(49-54) 108/49 (03/01 0353) SpO2:  [99 %-100 %] 100 % (03/01 0455) Arterial Line BP: (72-161)/(41-115) 112/55 (03/01 0455) FiO2 (%):  [40 %] 40 % (03/01 0353) Weight:  [120 lb 9.5 oz (54.7 kg)] 120 lb 9.5 oz (54.7 kg) (03/01 0455) Last BM Date: (pta)  Weight change: Filed Weights   02/06/18 1030 02/07/18 0339 02/08/18 0455  Weight: 122 lb 2.2 oz (55.4 kg) 117 lb 4.6 oz (53.2 kg) 120 lb 9.5 oz (54.7 kg)    Intake/Output:   Intake/Output Summary (Last 24 hours) at 02/08/2018 9449 Last data filed at 02/08/2018 0700 Gross per 24 hour  Intake 2088.37 ml  Output 1428 ml  Net 660.37 ml    Physical Exam    General: Intubated/sedated.  HEENT: + ETT Neck: Supple. JVP ~7-8. Carotids 2+ bilat; no bruits. No thyromegaly or nodule noted. Cor: PMI nondisplaced. RRR, No M/G/R noted. R chest impella site stable.  Lungs: Diminished basilar sounds Abdomen: Soft, non-tender, non-distended, no HSM. No bruits or masses. +BS  Extremities: No cyanosis, clubbing, or rash. Trace ankle edema.  Neuro: Intubated/sedated   Telemetry   NSR 80-90s, + PVCs, personally reviewed.   EKG    No new tracings.    Labs    CBC Recent Labs     02/07/18 0400 02/08/18 0322  WBC 11.6* 10.7*  NEUTROABS 9.1* 8.0*  HGB 9.3* 8.6*  HCT 27.9* 25.6*  MCV 91.8 91.4  PLT 82* 90*   Basic Metabolic Panel Recent Labs    02/06/18 0401 02/07/18 0400 02/08/18 0322  NA 134* 132*  --   K 3.9 4.1  --   CL 97* 96*  --   CO2 28 25  --   GLUCOSE 175* 147*  --   BUN 9 10  --   CREATININE 0.98 0.76  --   CALCIUM 8.3* 8.2*  --   MG 1.6* 2.1 1.8   Liver Function Tests Recent Labs    02/06/18 0401 02/07/18 0400  AST 93* 113*  ALT 56* 54  ALKPHOS 52 56  BILITOT 3.2* 3.2*  PROT 5.4* 4.9*  ALBUMIN 3.0* 2.7*   No results for input(s): LIPASE, AMYLASE in the last 72 hours. Cardiac Enzymes No results for input(s): CKTOTAL, CKMB, CKMBINDEX, TROPONINI in the last 72 hours.  BNP: BNP (last 3 results) Recent Labs    02/03/18 0853  BNP 3,127.4*    ProBNP (last 3 results) No results for input(s): PROBNP in the last 8760 hours.   D-Dimer No results for input(s): DDIMER in the last 72 hours. Hemoglobin A1C No results for input(s): HGBA1C in the last  72 hours. Fasting Lipid Panel No results for input(s): CHOL, HDL, LDLCALC, TRIG, CHOLHDL, LDLDIRECT in the last 72 hours. Thyroid Function Tests No results for input(s): TSH, T4TOTAL, T3FREE, THYROIDAB in the last 72 hours.  Invalid input(s): FREET3  Other results:   Imaging    No results found.   Medications:     Scheduled Medications: . aspirin EC  325 mg Oral Daily   Or  . aspirin  324 mg Per Tube Daily  . bisacodyl  10 mg Rectal Daily  . chlorhexidine gluconate (MEDLINE KIT)  15 mL Mouth Rinse BID  . Chlorhexidine Gluconate Cloth  6 each Topical Daily  . digoxin  0.125 mg Intravenous Daily  . docusate  100 mg Per Tube BID  . fentaNYL (SUBLIMAZE) injection  50 mcg Intravenous Once  . heparin-papaverine-plasmalyte irrigation   Irrigation To OR  . insulin aspart  0-24 Units Subcutaneous Q4H  . ipratropium-albuterol  3 mL Nebulization TID  . magnesium sulfate  40  mEq Other To OR  . mouth rinse  15 mL Mouth Rinse 10 times per day  . pantoprazole (PROTONIX) IV  40 mg Intravenous Q24H  . potassium chloride  80 mEq Other To OR  . sodium chloride flush  10-40 mL Intracatheter Q12H  . sodium chloride flush  3 mL Intravenous Q12H  . tranexamic acid  15 mg/kg Intravenous To OR  . tranexamic acid  2 mg/kg Intracatheter To OR  . vancomycin 1000 mg in NS (1000 ml) irrigation for Dr. Roxy Manns case   Irrigation To OR    Infusions: . sodium chloride 10 mL/hr at 02/06/18 1309  . sodium chloride Stopped (02/06/18 0700)  . sodium chloride    . sodium chloride    . sodium chloride    . sodium chloride    . amiodarone 30 mg/hr (02/07/18 2215)  . cefUROXime (ZINACEF)  IV    . cefUROXime (ZINACEF)  IV    . dexmedetomidine (PRECEDEX) IV infusion 0.5 mcg/kg/hr (02/08/18 0320)  . dexmedetomidine    . dextrose 5 % Impella 5.0 Purge solution Stopped (02/04/18 1142)  . DOPamine    . EPINEPHrine 4 mg in dextrose 5% 250 mL infusion (16 mcg/mL) Stopped (02/06/18 1733)  . epinephrine    . epinephrine    . fentaNYL infusion INTRAVENOUS 175 mcg/hr (02/08/18 0122)  . heparin 30,000 units/NS 1000 mL solution for CELLSAVER    . insulin (NOVOLIN-R) infusion    . lactated ringers    . lactated ringers    . lactated ringers    . midazolam (VERSED) infusion 1 mg/hr (02/07/18 1851)  . milrinone 0.25 mcg/kg/min (02/07/18 1123)  . milrinone    . nitroGLYCERIN    . nitroGLYCERIN    . norepinephrine (LEVOPHED) Adult infusion 13 mcg/min (02/08/18 0636)  . phenylephrine (NEO-SYNEPHRINE) Adult infusion    . phenylephrine 60m/250mL NS (0.011mml) infusion    . potassium chloride 10 mEq (02/05/18 1204)  . tranexamic acid (CYKLOKAPRON) infusion ([facility])    . vancomycin      PRN Medications: sodium chloride, sodium chloride, Place/Maintain arterial line **AND** sodium chloride, Place/Maintain arterial line **AND** sodium chloride, acetaminophen **OR** acetaminophen (TYLENOL) oral  liquid 160 mg/5 mL, fentaNYL, Influenza vac split quadrivalent PF, ipratropium-albuterol, lactated ringers, metoprolol tartrate, midazolam, ondansetron (ZOFRAN) IV, pneumococcal 23 valent vaccine, potassium chloride, sodium chloride flush, sodium chloride flush  Patient Profile   Brittany [name] a [age]/o woman with a history of PAF, preveious smoker quit 4 years ago, hypertension, previous  small cell lung cancer treated with chemo, chest XRT and prophylacticbrain radiation in 2015, family history of premature CAD. She was admitted with NSTEMI and shock.  Emergent cath 02/03/18 showed LAD 100% stenosed, LCx 95% stenosed. Taken for emergent CABG 02/03/18  Assessment/Plan   1. NSTEMI/STEMI s/p Emergent CABG 02/03/18 - New LBBB on ECG - cath with severe 2v CAD as above with likely severe LV dysfunction/ICM - s/p Emergent CABG 02/03/18.  - Platelts low but stable at 90. Impella to come out this am.   2. Acute systolic HF -> cardiogenic shock in setting of #1 - TEE 02/03/18 LVEF 20%, Mild to Mod AR, Mild/Mod MR, RV normal.  - Coox 69% on Norepi 13 and Milrinone 0.25 mcg/kg/min. Titrate Epi as needed.  - Impella at p-3 with ~2 L of flow. Platelets low but stable. Impella out this am.  - Volume status OK this am. No lasix since taking to OR.  - No BB yet with cardiogenic shock.  - Discussed at VAD MRB 02/04/18 for potential of durable VAD placement.   3. Acute respiratory failure d/t pulmonary edema - Remains intubated and sedated.   4. Tachycardia - Initial ECG looks like sinus tach with new LBBB - Remains in sinus tach by tele.  - No BB yet with cardiogenic shock.  - Goal K > 4.0 and Mg > 2.0. - Will supp Mg post OR.   5. PAF - Previously not on AC. - CHA2DS2/VASc is at least 4. (CHF, Vasc disease, HTN, Female)  - Continue heparin.   6. H/o SCLC - s/p treatment 2015. Lost to f/u since 04/2015. Will need f/u with oncology (Dr. Julien Nordmann) as outpatient. - No change.    Medication concerns reviewed with patient and pharmacy team. Barriers identified: None at this time.   Length of Stay: 5  Annamaria Helling  02/08/2018, 7:22 AM  Advanced Heart Failure Team Pager [phone] (M-F; 7a - 4p)  Please contact [facility] for night-coverage after hours (4p -7a ) and weekends on [email or web address]  Agree.   Impella turned down to P-3. Off Epi. On NE at 92 and milrinone 0.25. Co-ox 69%. Volumes status looks good. Waveform on Impella looks good. Flow 2L on P-3. No bleeding  Awake on vent RIJ swan. R axillary impella Cor RRR impella hum Lungs clear  Ab soft NT Ext warm no edema.   Tolerated impella wean overnight. Off epi drip. Volume status looks good. Dr. Roxy Manns at bedside and will take to OR today for impella removal. Hopefully can wean NE after extubation .  CRITICAL CARE Performed by: Glori Bickers  Total critical care time: 35 minutes  Critical care time was exclusive of separately billable procedures and treating other patients.  Critical care was necessary to treat or prevent imminent or life-threatening deterioration.  Critical care was time spent personally by me (independent of midlevel providers or residents) on the following activities: development of treatment plan with patient and/or surrogate as well as nursing, discussions with consultants, evaluation of patient's response to treatment, examination of patient, obtaining history from patient or surrogate, ordering and performing treatments and interventions, ordering and review of laboratory studies, ordering and review of radiographic studies, pulse oximetry and re-evaluation of patient's condition.  Glori Bickers, MD  8:00 AM

## 2018-02-08 NOTE — Progress Notes (Signed)
EKG CRITICAL VALUE     12 lead EKG performed.  Critical value noted. Hurshel Party, RN notified.   Jekhi Bolin L, CCT 02/08/2018 9:58 AM

## 2018-02-08 NOTE — Progress Notes (Signed)
Fentanyl 148ml and Versed 33ml flushed down sink. Witnessed  By Jacob Moores

## 2018-02-08 NOTE — Progress Notes (Signed)
Nutrition Follow-up  DOCUMENTATION CODES:   Not applicable  INTERVENTION:   -If extubated, recommend addition of Ensure Enlive po BID, each supplement provides 350 kcal and 20 grams of protein, once diet advanced  -If unable to extubate, recommend insertion of Cortrak tube (post-pyloric position) with initiation of enteral nutrition  Tube Feeding Recommendations:  Goal Vital High Protein @ 55 ml/hr Begin TF at 20 ml/hr, titrate by 10 mL q 8 hours until goal rate met.  Provides 116 g of protein, 1320 kcals, 1109 mL of free water Meets 98% calorie needs, 105% protein needs    NUTRITION DIAGNOSIS:   Inadequate oral intake related to inability to eat as evidenced by NPO status.  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 Pt required emergent CABG 02/03/2018 with Impella placement. Pt remains intubated post-operatively.   3/1 Impella removed  Pt remains on vent support this AM; per Thayer Headings RN, plan to hopefully extubate today  Noted Vital High Protein initiated yesterday at rate of 20 ml/hr via OG tube; TF held due to 250 mL residuals. No report of vomiting, abdominal distention, diarrhea, etc. Cortrak tube order placed yesterday but discontinued  Pt with inadequate nutrition since admission (5 days)  Constipation, no BM since admission. Noted bowel regimen  Abdomen soft  Labs: sodium wdl Meds: levophed, milrinone, precedex, fentanyl  Diet Order:  Diet NPO time specified  EDUCATION NEEDS:   Not appropriate for education at this time  Skin:  Skin Assessment: Skin Integrity Issues: Skin Integrity Issues:: Incisions Incisions: chest and R leg  Last BM:  no  documented BM  Height:   Ht Readings from Last 1 Encounters:  02/06/18 _0  (1.549 m)    Weight:   Wt Readings from Last 1 Encounters:  02/08/18 120 lb 9.5 oz (54.7 kg)    Ideal Body Weight:  47.7 kg  BMI:  Body mass index is 22.79 kg/m.  Estimated Nutritional Needs:   Kcal:  6045  Protein:  80-106 g  Fluid:  >/= 1.5 L/d   Kerman Passey MS, RD, LDN, CNSC [phone] Pager  [phone] Weekend/On-Call Pager

## 2018-02-08 NOTE — Transfer of Care (Signed)
Immediate Anesthesia Transfer of Care Note  Patient: Brittany [name]  Procedure(s) Performed: REMOVAL OF IMPELLA LEFT VENTRICULAR ASSIST DEVICE (N/A ) TRANSESOPHAGEAL ECHOCARDIOGRAM (TEE) (N/A )  Patient Location: ICU  Anesthesia Type:General  Level of Consciousness: sedated and Patient remains intubated per anesthesia plan  Airway & Oxygen Therapy: Patient remains intubated per anesthesia plan and Patient placed on Ventilator (see vital sign flow sheet for setting)  Post-op Assessment: Report given to RN and Post -op Vital signs reviewed and stable  Post vital signs: Reviewed and stable Dr Roxy Manns at bedside.  Last Vitals:  Vitals:   02/08/18 0730 02/08/18 0934  BP:  (!) 126/42  Pulse:  93  Resp: 20 20  Temp: 37.4 C   SpO2: 100% 99%    Last Pain:  Vitals:   02/08/18 0400  TempSrc: Core  PainSc:          Complications: No apparent anesthesia complications

## 2018-02-08 NOTE — Anesthesia Preprocedure Evaluation (Signed)
Anesthesia Evaluation  Patient identified by MRN, date of birth, ID band Patient unresponsive  General Assessment Comment:Pt is sedated and intubated.  Reviewed: Unable to perform ROS - Chart review only  Airway Mallampati: Intubated       Dental   Pulmonary asthma , former smoker,    breath sounds clear to auscultation       Cardiovascular hypertension, + angina + CAD, + Past MI, + CABG and +CHF   Rhythm:regular Rate:Normal  Impella device in place.  Pt on levophed, milrinone, amiodarone gtts   Neuro/Psych Anxiety    GI/Hepatic   Endo/Other    Renal/GU      Musculoskeletal   Abdominal   Peds  Hematology   Anesthesia Other Findings   Reproductive/Obstetrics                             Anesthesia Physical Anesthesia Plan  ASA: IV  Anesthesia Plan: General   Post-op Pain Management:    Induction: Intravenous  PONV Risk Score and Plan: 3 and Ondansetron, Midazolam and Treatment may vary due to age or medical condition  Airway Management Planned: Oral ETT  Additional Equipment: Arterial line, CVP, PA Cath and TEE  Intra-op Plan:   Post-operative Plan: Post-operative intubation/ventilation  Informed Consent: I have reviewed the patients History and Physical, chart, labs and discussed the procedure including the risks, benefits and alternatives for the proposed anesthesia with the patient or authorized representative who has indicated his/her understanding and acceptance.     Plan Discussed with: CRNA, Anesthesiologist and Surgeon  Anesthesia Plan Comments:         Anesthesia Quick Evaluation

## 2018-02-08 NOTE — Progress Notes (Signed)
Echocardiogram Echocardiogram Transesophageal has been performed.  Brittany [name] 02/08/2018, 8:59 AM

## 2018-02-09 ENCOUNTER — Inpatient Hospital Stay (HOSPITAL_COMMUNITY): Payer: BLUE CROSS/BLUE SHIELD

## 2018-02-09 LAB — CBC
HCT: 28.5 % — ABNORMAL LOW (ref 36.0–46.0)
HCT: 29.6 % — ABNORMAL LOW (ref 36.0–46.0)
Hemoglobin: 9.8 g/dL — ABNORMAL LOW (ref 12.0–15.0)
Hemoglobin: 10.2 g/dL — ABNORMAL LOW (ref 12.0–15.0)
MCH: 31 pg (ref 26.0–34.0)
MCH: 31.3 pg (ref 26.0–34.0)
MCHC: 34.4 g/dL (ref 30.0–36.0)
MCHC: 34.5 g/dL (ref 30.0–36.0)
MCV: 90.2 fL (ref 78.0–100.0)
MCV: 90.8 fL (ref 78.0–100.0)
PLATELETS: 143 10*3/uL — AB (ref 150–400)
PLATELETS: 153 10*3/uL (ref 150–400)
RBC: 3.16 MIL/uL — ABNORMAL LOW (ref 3.87–5.11)
RBC: 3.26 MIL/uL — AB (ref 3.87–5.11)
RDW: 16.1 % — AB (ref 11.5–15.5)
RDW: 16.3 % — AB (ref 11.5–15.5)
WBC: 12.9 10*3/uL — AB (ref 4.0–10.5)
WBC: 12.7 10*3/uL — ABNORMAL HIGH (ref 4.0–10.5)

## 2018-02-09 LAB — CALCIUM, IONIZED: CALCIUM, IONIZED, SERUM: 4.7 mg/dL (ref 4.5–5.6)

## 2018-02-09 LAB — POCT I-STAT 3, ART BLOOD GAS (G3+)
ACID-BASE EXCESS: 3 mmol/L — AB (ref 0.0–2.0)
Bicarbonate: 28 mmol/L (ref 20.0–28.0)
O2 SAT: 95 %
PH ART: 7.433 (ref 7.350–7.450)
PO2 ART: 72 mmHg — AB (ref 83.0–108.0)
TCO2: 29 mmol/L (ref 22–32)
pCO2 arterial: 41.7 mmHg (ref 32.0–48.0)

## 2018-02-09 LAB — BASIC METABOLIC PANEL
ANION GAP: 12 (ref 5–15)
BUN: 9 mg/dL (ref 6–20)
CALCIUM: 8.4 mg/dL — AB (ref 8.9–10.3)
CO2: 25 mmol/L (ref 22–32)
Chloride: 97 mmol/L — ABNORMAL LOW (ref 101–111)
Creatinine, Ser: 0.72 mg/dL (ref 0.44–1.00)
GFR calc Af Amer: >60 mL/min (ref >60)
Glucose, Bld: 116 mg/dL — ABNORMAL HIGH (ref 65–99)
POTASSIUM: 3.6 mmol/L (ref 3.5–5.1)
SODIUM: 134 mmol/L — AB (ref 135–145)

## 2018-02-09 LAB — MAGNESIUM
MAGNESIUM: 2.1 mg/dL (ref 1.7–2.4)
Magnesium: 1.8 mg/dL (ref 1.7–2.4)

## 2018-02-09 LAB — POCT I-STAT, CHEM 8
BUN: 9 mg/dL (ref 6–20)
CHLORIDE: 93 mmol/L — AB (ref 101–111)
Calcium, Ion: 1.1 mmol/L — ABNORMAL LOW (ref 1.15–1.40)
Creatinine, Ser: 0.6 mg/dL (ref 0.44–1.00)
Glucose, Bld: 129 mg/dL — ABNORMAL HIGH (ref 65–99)
HCT: 33 % — ABNORMAL LOW (ref 36.0–46.0)
Hemoglobin: 11.2 g/dL — ABNORMAL LOW (ref 12.0–15.0)
Potassium: 3.2 mmol/L — ABNORMAL LOW (ref 3.5–5.1)
SODIUM: 136 mmol/L (ref 135–145)
TCO2: 29 mmol/L (ref 22–32)

## 2018-02-09 LAB — COOXEMETRY PANEL
CARBOXYHEMOGLOBIN: 1.2 % (ref 0.5–1.5)
Methemoglobin: 1.6 % — ABNORMAL HIGH (ref 0.0–1.5)
O2 SAT: 68.1 %
TOTAL HEMOGLOBIN: 9.2 g/dL — AB (ref 12.0–16.0)

## 2018-02-09 MED ORDER — POTASSIUM CHLORIDE 10 MEQ/50ML IV SOLN
10.0000 meq | INTRAVENOUS | Status: AC
  Administered 2018-02-09 (×3): 10 meq via INTRAVENOUS
  Filled 2018-02-09 (×3): qty 50

## 2018-02-09 MED ORDER — LEVALBUTEROL HCL 1.25 MG/0.5ML IN NEBU
INHALATION_SOLUTION | RESPIRATORY_TRACT | Status: AC
  Filled 2018-02-09: qty 0.5

## 2018-02-09 MED ORDER — FUROSEMIDE 10 MG/ML IJ SOLN
40.0000 mg | Freq: Two times a day (BID) | INTRAMUSCULAR | Status: DC
  Administered 2018-02-09 – 2018-02-10 (×3): 40 mg via INTRAVENOUS
  Filled 2018-02-09 (×2): qty 4

## 2018-02-09 MED ORDER — SACUBITRIL-VALSARTAN 24-26 MG PO TABS
1.0000 | ORAL_TABLET | Freq: Two times a day (BID) | ORAL | Status: DC
  Administered 2018-02-09 – 2018-02-10 (×4): 1 via ORAL
  Filled 2018-02-09 (×5): qty 1

## 2018-02-09 MED ORDER — DIGOXIN 125 MCG PO TABS
0.1250 mg | ORAL_TABLET | Freq: Every day | ORAL | Status: DC
  Administered 2018-02-09 – 2018-02-20 (×12): 0.125 mg via ORAL
  Filled 2018-02-09 (×12): qty 1

## 2018-02-09 MED ORDER — ALBUTEROL SULFATE (2.5 MG/3ML) 0.083% IN NEBU
2.5000 mg | INHALATION_SOLUTION | RESPIRATORY_TRACT | Status: DC | PRN
  Administered 2018-02-09 (×3): 2.5 mg via RESPIRATORY_TRACT
  Filled 2018-02-09 (×3): qty 3

## 2018-02-09 MED ORDER — MILRINONE LACTATE IN DEXTROSE 20-5 MG/100ML-% IV SOLN
0.1250 ug/kg/min | INTRAVENOUS | Status: DC
  Administered 2018-02-09: 0.125 ug/kg/min via INTRAVENOUS
  Filled 2018-02-09: qty 100

## 2018-02-09 MED ORDER — FUROSEMIDE 10 MG/ML IJ SOLN
40.0000 mg | Freq: Once | INTRAMUSCULAR | Status: AC
  Administered 2018-02-09: 40 mg via INTRAVENOUS

## 2018-02-09 MED ORDER — POTASSIUM CHLORIDE 10 MEQ/50ML IV SOLN: 10.0000 meq | INTRAVENOUS | Status: DC

## 2018-02-09 MED ORDER — AMIODARONE HCL 200 MG PO TABS
200.0000 mg | ORAL_TABLET | Freq: Two times a day (BID) | ORAL | Status: DC
  Administered 2018-02-09 – 2018-02-21 (×26): 200 mg via ORAL
  Filled 2018-02-09 (×26): qty 1

## 2018-02-09 MED ORDER — FUROSEMIDE 10 MG/ML IJ SOLN
INTRAMUSCULAR | Status: AC
  Filled 2018-02-09: qty 4

## 2018-02-09 MED ORDER — ALBUTEROL SULFATE (2.5 MG/3ML) 0.083% IN NEBU
2.5000 mg | INHALATION_SOLUTION | RESPIRATORY_TRACT | Status: DC
  Administered 2018-02-09 – 2018-02-10 (×3): 2.5 mg via RESPIRATORY_TRACT
  Filled 2018-02-09 (×3): qty 3

## 2018-02-09 MED ORDER — POTASSIUM CHLORIDE 10 MEQ/50ML IV SOLN
10.0000 meq | INTRAVENOUS | Status: AC
  Administered 2018-02-09 – 2018-02-10 (×5): 10 meq via INTRAVENOUS
  Filled 2018-02-09 (×5): qty 50

## 2018-02-09 MED ORDER — ALBUTEROL SULFATE (2.5 MG/3ML) 0.083% IN NEBU
INHALATION_SOLUTION | RESPIRATORY_TRACT | Status: AC
  Administered 2018-02-09: 2.5 mg
  Filled 2018-02-09: qty 3

## 2018-02-09 NOTE — Progress Notes (Signed)
      [address]       [place] [address]             [phone]      Sleeping currently  BP 123/74   Pulse (!) 114   Temp 98.3 F (36.8 C) (Oral)   Resp (!) 26   Ht 5\' 1"  (1.549 m)   Wt 118 lb 2.7 oz (53.6 kg)   SpO2 99%   BMI 22.33 kg/m   Intake/Output Summary (Last 24 hours) at 02/09/2018 1802 Last data filed at 02/09/2018 1600 Gross per 24 hour  Intake 1132 ml  Output 3075 ml  Net -1943 ml   Now on 12L HFNC with sat 99%  Continue diuresis  K= 3.2- will supplement  Remo Lipps C. Roxan Hockey, MD [facility] [phone]

## 2018-02-09 NOTE — Progress Notes (Signed)
1 Day Post-Op Procedure(s) (LRB): REMOVAL OF IMPELLA LEFT VENTRICULAR ASSIST DEVICE (N/A) TRANSESOPHAGEAL ECHOCARDIOGRAM (TEE) (N/A) Subjective: In good spirits  Objective: Vital signs in last 24 hours: Temp:  [97 F (36.1 C)-99.3 F (37.4 C)] 98.4 F (36.9 C) (03/02 0930) Pulse Rate:  [86-148] 111 (03/02 0930) Cardiac Rhythm: Sinus tachycardia (03/02 0800) Resp:  [0-35] 28 (03/02 0930) BP: (90-145)/(41-96) 116/73 (03/02 0930) SpO2:  [76 %-100 %] 89 % (03/02 0930) Arterial Line BP: (114-180)/(41-80) 147/58 (03/02 0930) FiO2 (%):  [40 %] 40 % (03/01 1420) Weight:  [118 lb 2.7 oz (53.6 kg)] 118 lb 2.7 oz (53.6 kg) (03/02 0630)  Hemodynamic parameters for last 24 hours: PAP: (21-51)/(4-31) 29/19 CVP:  [8 mmHg-16 mmHg] 16 mmHg CO:  [3.7 L/min-5.5 L/min] 5.5 L/min CI:  [2.4 L/min/m2-3.7 L/min/m2] 3.7 L/min/m2  Intake/Output from previous day: 03/01 0701 - 03/02 0700 In: 1864.7 [I.V.:1049.7; Blood:315; IV [id number]:324] Out: 2240 [Urine:2210; Blood:30] Intake/Output this shift: Total I/O In: 131.6 [I.V.:81.6; IV Piggyback:50] Out: 350 [Urine:350]  General appearance: alert, cooperative and no distress Neurologic: intact Heart: regular rate and rhythm Lungs: rhonchi faint bilaterally Abdomen: normal findings: soft, non-tender Wound: serous drainage from CT sites  Lab Results: Recent Labs    02/08/18 2109 02/09/18 0404  WBC 12.7* 12.9*  HGB 9.7* 9.8*  HCT 28.4* 28.5*  PLT 118* 143*   BMET:  Recent Labs    02/08/18 2109 02/09/18 0404  NA 132* 134*  K 3.8 3.6  CL 97* 97*  CO2 24 25  GLUCOSE 215* 116*  BUN 10 9  CREATININE 0.79 0.72  CALCIUM 8.2* 8.4*    PT/INR:  Recent Labs    02/08/18 0939  LABPROT 15.9*  INR 1.28   ABG    Component Value Date/Time   PHART 7.433 02/09/2018 0415   HCO3 28.0 02/09/2018 0415   TCO2 29 02/09/2018 0415   ACIDBASEDEF 1.0 02/08/2018 2117   O2SAT 68.1 02/09/2018 0430   CBG (last 3)  Recent Labs    02/07/18 2006  02/08/18 0033 02/08/18 0341  GLUCAP 124* 85 126*    Assessment/Plan: S/P Procedure(s) (LRB): REMOVAL OF IMPELLA LEFT VENTRICULAR ASSIST DEVICE (N/A) TRANSESOPHAGEAL ECHOCARDIOGRAM (TEE) (N/A) -CV- impella removed yesterday, co-ox Ok and good CO- dc swan and A line  Milrinone per AHF team  RESP- CXR showed increased pulmonary edema- diurese aggressively  RENAL- creatinine OK, supplement K  ENDO- CBG well controlled  Begin to mobilize as tolerated   LOS: 6 days    Melrose Nakayama 02/09/2018

## 2018-02-09 NOTE — Progress Notes (Signed)
RT note- Placed to 12 l/min HFNC, Sp02 now 100%.

## 2018-02-09 NOTE — Progress Notes (Addendum)
Advanced Heart Failure Rounding Note  PCP-Cardiologist: No primary care provider on file.   Subjective:    Events S/p emergent CABG 02/03/18 IABP removed 02/04/18 Impella out 02/08/18 Extubated 02/08/18  Extubated yesterday. Feels weak. Hoarse. Mild dyspnea + orthopnea. + cough.   CXR (Personally reviewed) has increasing edema. Remains on milrinone Co-ox 68%  TEE 02/03/18 LVEF 20%, Mild to Mod AR, Mild/Mod MR, RV normal.   Objective:   Weight Range: 53.6 kg (118 lb 2.7 oz) Body mass index is 22.33 kg/m.   Vital Signs:   Temp:  [97 F (36.1 C)-99.3 F (37.4 C)] 98.4 F (36.9 C) (03/02 0930) Pulse Rate:  [86-148] 111 (03/02 0930) Resp:  [0-35] 28 (03/02 0930) BP: (90-145)/(41-96) 116/73 (03/02 0930) SpO2:  [76 %-100 %] 89 % (03/02 0930) Arterial Line BP: (114-180)/(41-80) 147/58 (03/02 0930) FiO2 (%):  [40 %] 40 % (03/01 1420) Weight:  [53.6 kg (118 lb 2.7 oz)] 53.6 kg (118 lb 2.7 oz) (03/02 0630) Last BM Date: (pta)  Weight change: Filed Weights   02/07/18 0339 02/08/18 0455 02/09/18 0630  Weight: 53.2 kg (117 lb 4.6 oz) 54.7 kg (120 lb 9.5 oz) 53.6 kg (118 lb 2.7 oz)    Intake/Output:   Intake/Output Summary (Last 24 hours) at 02/09/2018 0937 Last data filed at 02/09/2018 0936 Gross per 24 hour  Intake 1887.33 ml  Output 2405 ml  Net -517.67 ml    Physical Exam    CVP 12 General:  Sitting up in bed. Mildly dyspneic HEENT: normal Neck: supple. JVP to jaw. RIJ swan. Carotids 2+ bilat; no bruits. No lymphadenopathy or thryomegaly appreciated. Cor: PMI nondisplaced. Tachy regular No rubs, gallops or murmurs. Sternal wound ok Lungs: + crackles Abdomen: soft, nontender, nondistended. No hepatosplenomegaly. No bruits or masses. Hypoactive bowel sounds. Extremities: no cyanosis, clubbing, rash, 1+ edema. warm Neuro: alert & orientedx3, cranial nerves grossly intact. moves all 4 extremities w/o difficulty. Affect pleasant   Telemetry   Sinus tach 110-120  Personally reviewed  EKG    No new tracings.    Labs    CBC Recent Labs    02/07/18 0400 02/08/18 0322  02/08/18 2109 02/09/18 0404  WBC 11.6* 10.7*   < > 12.7* 12.9*  NEUTROABS 9.1* 8.0*  --   --   --   HGB 9.3* 8.6*   < > 9.7* 9.8*  HCT 27.9* 25.6*   < > 28.4* 28.5*  MCV 91.8 91.4   < > 91.6 90.2  PLT 82* 90*   < > 118* 143*   < > = values in this interval not displayed.   Basic Metabolic Panel Recent Labs    02/08/18 1554  02/08/18 2109 02/09/18 0404  NA  --    < > 132* 134*  K  --    < > 3.8 3.6  CL  --    < > 97* 97*  CO2  --   --  24 25  GLUCOSE  --    < > 215* 116*  BUN  --    < > 10 9  CREATININE  --    < > 0.79 0.72  CALCIUM  --   --  8.2* 8.4*  MG 2.9*  --   --  2.1   < > = values in this interval not displayed.   Liver Function Tests Recent Labs    02/07/18 0400 02/08/18 0718  AST 113* 85*  ALT 54 45  ALKPHOS 56  60  BILITOT 3.2* 3.5*  PROT 4.9* 4.7*  ALBUMIN 2.7* 2.3*   No results for input(s): LIPASE, AMYLASE in the last 72 hours. Cardiac Enzymes No results for input(s): CKTOTAL, CKMB, CKMBINDEX, TROPONINI in the last 72 hours.  BNP: BNP (last 3 results) Recent Labs    02/03/18 0853  BNP 3,127.4*    ProBNP (last 3 results) No results for input(s): PROBNP in the last 8760 hours.   D-Dimer No results for input(s): DDIMER in the last 72 hours. Hemoglobin A1C No results for input(s): HGBA1C in the last 72 hours. Fasting Lipid Panel No results for input(s): CHOL, HDL, LDLCALC, TRIG, CHOLHDL, LDLDIRECT in the last 72 hours. Thyroid Function Tests No results for input(s): TSH, T4TOTAL, T3FREE, THYROIDAB in the last 72 hours.  Invalid input(s): FREET3  Other results:   Imaging    Dg Chest Port 1 View  Result Date: 02/09/2018 CLINICAL DATA:  57 year old female postoperative day 1 from removal of left ventricular Impella assist device. EXAM: PORTABLE CHEST 1 VIEW COMPARISON:  02/08/2018 and earlier. FINDINGS: Portable AP semi  upright view at 0555 hours. Extubated and enteric tube removed. Stable right IJ Swan-Ganz catheter tip at the main pulmonary outflow versus proximal right main pulmonary artery. Lower lung volumes with substantially increased bibasilar pulmonary opacity. Increased pulmonary vascularity with basilar predominance. No pneumothorax. Negative visible bowel gas pattern. IMPRESSION: 1. Extubated and enteric tube removed. Stable right IJ Swan-Ganz catheter. 2. Lower lung volumes with worsening bibasilar ventilation which appears to represent a combination of acute interstitial edema, pleural effusions, and atelectasis. Electronically Signed   By: Genevie Ann M.D.   On: 02/09/2018 08:18   Dg Chest Port 1 View  Result Date: 02/08/2018 CLINICAL DATA:  Left ventricular assist device removal, ventilatory support, intubated EXAM: PORTABLE CHEST 1 VIEW COMPARISON:  02/07/2018 FINDINGS: Endotracheal tube, Swan-Ganz catheter, and NG tube are all stable in position. Mediastinal drains and left chest tube removed. Epicardial pacing wires overlie the chest. Stable cardiomegaly. No enlarging effusion or pneumothorax. Right perihilar consolidation/partial collapse persist. No significant interval change in aeration. Overall stable postoperative appearance. IMPRESSION: Stable support apparatus with interval removal of the mediastinal drains and left chest tube. Stable cardiomegaly Persistent right hilar consolidation/partial collapse. No enlarging effusion or developing pneumothorax Electronically Signed   By: Jerilynn Mages.  Shick M.D.   On: 02/08/2018 10:00     Medications:     Scheduled Medications: . acetaminophen  1,000 mg Oral Q6H   Or  . acetaminophen (TYLENOL) oral liquid 160 mg/5 mL  1,000 mg Per Tube Q6H  . acetaminophen (TYLENOL) oral liquid 160 mg/5 mL  650 mg Per Tube Once   Or  . acetaminophen  650 mg Rectal Once  . aspirin EC  325 mg Oral Daily   Or  . aspirin  324 mg Per Tube Daily  . bisacodyl  10 mg Oral Daily   Or    . bisacodyl  10 mg Rectal Daily  . Chlorhexidine Gluconate Cloth  6 each Topical Daily  . docusate sodium  200 mg Oral Daily  . enoxaparin (LOVENOX) injection  30 mg Subcutaneous Q24H  . mouth rinse  15 mL Mouth Rinse BID  . [START ON 02/10/2018] pantoprazole  40 mg Oral Daily  . sodium chloride flush  10-40 mL Intracatheter Q12H  . sodium chloride flush  3 mL Intravenous Q12H    Infusions: . sodium chloride 10 mL/hr at 02/09/18 0900  . sodium chloride    . sodium chloride Stopped (  02/08/18 1700)  . albumin human    . amiodarone 30 mg/hr (02/09/18 0900)  .  ceFAZolin (ANCEF) IV 2 g (02/09/18 0522)  . dexmedetomidine (PRECEDEX) IV infusion Stopped (02/08/18 1300)  . lactated ringers    . lactated ringers 10 mL/hr at 02/09/18 0900  . milrinone 0.25 mcg/kg/min (02/09/18 0900)  . nitroGLYCERIN    . norepinephrine (LEVOPHED) Adult infusion Stopped (02/09/18 0300)  . 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 She was admitted with NSTEMI and shock.  Emergent cath 02/03/18 showed LAD 100% stenosed, LCx 95% stenosed. Taken for emergent CABG 02/03/18  Assessment/Plan   1. NSTEMI/STEMI s/p Emergent CABG 02/03/18 - New LBBB on ECG - cath with severe 2v CAD as above with likely severe LV dysfunction/ICM - s/p Emergent CABG 02/03/18.  - no s/s ischemia  2. Acute systolic HF -> cardiogenic shock in setting of #1 - TEE 02/03/18 LVEF 20%, Mild to Mod AR, Mild/Mod MR, RV normal.  - Impella out and extubated yesterday 3/1 -  Worsening pulmonary edema on CXR this am. CVP up. Will give lasix 40 IV tid today. D/w Dr. Roxan Hockey at bedside - Co-ox 68% on milrinone 0.25. Will drop to 0.125. Follow co-ox closely. Can pull swan. Leave introducer - Add digoxin. No b-blocker yet. Consider [name] - Discussed at VAD MRB 02/04/18 for potential of durable VAD placement if deteriorates.   3. Acute respiratory failure d/t pulmonary edema - Extubated yesterday - Respiratory status remains tenuous in setting of pulmonary edema. Diurese as above.  - I am worried she may be aspiration risk. Will have speech see  4. Tachycardia - Remains in sinus tach by tele.  - No BB yet with cardiogenic shock.  - Goal K > 4.0 and Mg > 2.0.  5. PAF - Previously not on AC. - CHA2DS2/VASc is at least 4. (CHF, Vasc disease, HTN, Female)  - Now in sinus. Will need AC prior to d/c. D/w Dr. Roxan Hockey will not start [facility] yet. Ok to use DVT dose heparin   6. H/o SCLC - s/p treatment 2015. Lost to f/u since 04/2015. Will need f/u with oncology (Dr. Julien Nordmann) as outpatient. - No change.  - Will need chest CT prior to d/c  Medication concerns reviewed with patient and pharmacy team. Barriers identified: None at this time.  CRITICAL CARE Performed by: Glori Bickers  Total critical care time: 35 minutes  Critical care time was exclusive of separately billable procedures and treating other patients.  Critical care was necessary to treat or prevent imminent or life-threatening deterioration.  Critical care was time spent personally by me (independent of midlevel providers or residents) on the following activities: development of treatment plan with patient and/or surrogate as well as nursing, discussions with consultants, evaluation of patient's response to treatment, examination of patient, obtaining history from patient or surrogate, ordering and performing treatments and interventions, ordering and review of laboratory studies, ordering and  review of radiographic studies, pulse oximetry and re-evaluation of patient's condition.    Length of Stay: Lillian, MD  02/09/2018, 9:37 AM  Advanced Heart  Failure Team Pager [phone] (M-F; 7a - 4p)  Please contact [facility] for night-coverage after hours (4p -7a ) and weekends on [email or web address]

## 2018-02-09 NOTE — Progress Notes (Signed)
EKG CRITICAL VALUE     12 lead EKG performed.  Critical value noted.  Zenia Resides, RN notified.   Genia Plants, CCT 02/09/2018 7:35 AM

## 2018-02-09 NOTE — Progress Notes (Signed)
RT note-Called to assess patient for sp02 77%. Albuterol nebulizer given, patient states that they help her breath better. Will change treatments to q4 for now and re assess. RN notified that Bipap might be a suggestion for MD. Continue to monitor. Sp02 now 90% on 6l/min [place].

## 2018-02-10 ENCOUNTER — Inpatient Hospital Stay (HOSPITAL_COMMUNITY): Payer: BLUE CROSS/BLUE SHIELD

## 2018-02-10 ENCOUNTER — Inpatient Hospital Stay: Payer: Self-pay

## 2018-02-10 DIAGNOSIS — Z951 Presence of aortocoronary bypass graft: Secondary | ICD-10-CM

## 2018-02-10 LAB — BASIC METABOLIC PANEL
Anion gap: 11 (ref 5–15)
BUN: 11 mg/dL (ref 6–20)
CALCIUM: 8.2 mg/dL — AB (ref 8.9–10.3)
CHLORIDE: 95 mmol/L — AB (ref 101–111)
CO2: 28 mmol/L (ref 22–32)
Creatinine, Ser: 0.73 mg/dL (ref 0.44–1.00)
GFR calc non Af Amer: >60 mL/min (ref >60)
GLUCOSE: 136 mg/dL — AB (ref 65–99)
Potassium: 4.2 mmol/L (ref 3.5–5.1)
Sodium: 134 mmol/L — ABNORMAL LOW (ref 135–145)

## 2018-02-10 LAB — CBC
HCT: 30.8 % — ABNORMAL LOW (ref 36.0–46.0)
Hemoglobin: 10.4 g/dL — ABNORMAL LOW (ref 12.0–15.0)
MCH: 30.9 pg (ref 26.0–34.0)
MCHC: 33.8 g/dL (ref 30.0–36.0)
MCV: 91.4 fL (ref 78.0–100.0)
PLATELETS: 212 10*3/uL (ref 150–400)
RBC: 3.37 MIL/uL — ABNORMAL LOW (ref 3.87–5.11)
RDW: 16.3 % — AB (ref 11.5–15.5)
WBC: 15 10*3/uL — ABNORMAL HIGH (ref 4.0–10.5)

## 2018-02-10 LAB — COOXEMETRY PANEL
CARBOXYHEMOGLOBIN: 1.1 % (ref 0.5–1.5)
Methemoglobin: 1.2 % (ref 0.0–1.5)
O2 SAT: 69.7 %
TOTAL HEMOGLOBIN: 13.7 g/dL (ref 12.0–16.0)

## 2018-02-10 MED ORDER — CARVEDILOL 3.125 MG PO TABS: 3.1250 mg | ORAL_TABLET | Freq: Two times a day (BID) | ORAL | Status: DC

## 2018-02-10 MED ORDER — FUROSEMIDE 40 MG PO TABS: 40.0000 mg | ORAL_TABLET | Freq: Every day | ORAL | Status: DC

## 2018-02-10 MED ORDER — SPIRONOLACTONE 12.5 MG HALF TABLET
12.5000 mg | ORAL_TABLET | Freq: Every day | ORAL | Status: DC
  Administered 2018-02-10 – 2018-02-12 (×3): 12.5 mg via ORAL
  Filled 2018-02-10 (×5): qty 1

## 2018-02-10 MED ORDER — RESOURCE THICKENUP CLEAR PO POWD
ORAL | Status: DC | PRN
  Administered 2018-02-14 – 2018-02-15 (×2): via ORAL
  Filled 2018-02-10 (×2): qty 125

## 2018-02-10 MED ORDER — CHLORHEXIDINE GLUCONATE CLOTH 2 % EX PADS
6.0000 | MEDICATED_PAD | Freq: Every day | CUTANEOUS | Status: DC
  Administered 2018-02-11 – 2018-02-23 (×13): 6 via TOPICAL

## 2018-02-10 MED ORDER — LEVALBUTEROL HCL 0.63 MG/3ML IN NEBU
0.6300 mg | INHALATION_SOLUTION | Freq: Four times a day (QID) | RESPIRATORY_TRACT | Status: DC
  Administered 2018-02-10 – 2018-02-12 (×9): 0.63 mg via RESPIRATORY_TRACT
  Filled 2018-02-10 (×9): qty 3

## 2018-02-10 MED ORDER — SODIUM CHLORIDE 0.9% FLUSH
10.0000 mL | Freq: Two times a day (BID) | INTRAVENOUS | Status: DC
  Administered 2018-02-10 – 2018-02-13 (×6): 10 mL
  Administered 2018-02-14: 40 mL
  Administered 2018-02-14: 10 mL
  Administered 2018-02-15: 30 mL
  Administered 2018-02-16 – 2018-02-20 (×6): 10 mL
  Administered 2018-02-21 (×2): 20 mL
  Administered 2018-02-22: 10 mL
  Administered 2018-02-22: 20 mL
  Administered 2018-02-23 – 2018-02-24 (×3): 10 mL
  Administered 2018-02-24: 20 mL
  Administered 2018-02-25: 30 mL
  Administered 2018-02-25: 10 mL
  Administered 2018-02-26: 30 mL

## 2018-02-10 MED ORDER — SODIUM CHLORIDE 0.9% FLUSH: 10.0000 mL | INTRAVENOUS | Status: DC | PRN

## 2018-02-10 NOTE — Progress Notes (Signed)
Peripherally Inserted Central Catheter/Midline Placement  The IV Nurse has discussed with the patient and/or persons authorized to consent for the patient, the purpose of this procedure and the potential benefits and risks involved with this procedure.  The benefits include less needle sticks, lab draws from the catheter, and the patient may be discharged home with the catheter. Risks include, but not limited to, infection, bleeding, blood clot (thrombus formation), and puncture of an artery; nerve damage and irregular heartbeat and possibility to perform a PICC exchange if needed/ordered by physician.  Alternatives to this procedure were also discussed.  Bard Power PICC patient education guide, fact sheet on infection prevention and patient information card has been provided to patient /or left at bedside.    PICC/Midline Placement Documentation  PICC Triple Lumen 29/51/88 PICC Left Cephalic 40 cm 1 cm (Active)  Indication for Insertion or Continuance of Line Administration of hyperosmolar/irritating solutions (i.e. TPN, Vancomycin, etc.) 02/10/2018  6:44 PM  Exposed Catheter (cm) 1 cm 02/10/2018  6:44 PM  Site Assessment Clean;Dry;Intact 02/10/2018  6:44 PM  Lumen #1 Status Blood return noted;Flushed;Saline locked 02/10/2018  6:44 PM  Lumen #2 Status Blood return noted;Flushed;Saline locked 02/10/2018  6:44 PM  Lumen #3 Status Blood return noted;Flushed;Saline locked 02/10/2018  6:44 PM  Dressing Type Transparent 02/10/2018  6:44 PM  Dressing Status Clean;Dry;Intact;Antimicrobial disc in place 02/10/2018  6:44 PM  Dressing Intervention New dressing 02/10/2018  6:44 PM  Dressing Change Due 02/17/18 02/10/2018  6:44 PM       Aldona Lento L 02/10/2018, 7:05 PM

## 2018-02-10 NOTE — Progress Notes (Signed)
      [address]       [place] [address]             [phone]      Stable day  BP 119/80 (BP Location: Right Arm)   Pulse (!) 111   Temp 98.4 F (36.9 C) (Oral)   Resp 19   Ht 5\' 1"  (1.549 m)   Wt 111 lb 8.8 oz (50.6 kg)   SpO2 97%   BMI 21.08 kg/m   Intake/Output Summary (Last 24 hours) at 02/10/2018 1644 Last data filed at 02/10/2018 1500 Gross per 24 hour  Intake 794 ml  Output 1485 ml  Net -691 ml   No PM labs Remains on milrinone 0.125 mcg/kg/min  Remo Lipps C. Roxan Hockey, MD [facility] [phone]

## 2018-02-10 NOTE — Progress Notes (Signed)
2 Days Post-Op Procedure(s) (LRB): REMOVAL OF IMPELLA LEFT VENTRICULAR ASSIST DEVICE (N/A) TRANSESOPHAGEAL ECHOCARDIOGRAM (TEE) (N/A) Subjective: Feels tired  Objective: Vital signs in last 24 hours: Temp:  [98.3 F (36.8 C)-98.8 F (37.1 C)] 98.8 F (37.1 C) (03/03 0800) Pulse Rate:  [34-119] 113 (03/03 0800) Cardiac Rhythm: Sinus tachycardia (03/03 0800) Resp:  [11-35] 23 (03/03 0800) BP: (98-133)/(54-87) 133/87 (03/03 0800) SpO2:  [77 %-100 %] 97 % (03/03 0800) Arterial Line BP: (151-153)/(59-60) 151/60 (03/02 1030) Weight:  [111 lb 8.8 oz (50.6 kg)] 111 lb 8.8 oz (50.6 kg) (03/03 0500)  Hemodynamic parameters for last 24 hours: PAP: (29-30)/(22-23) 30/22  Intake/Output from previous day: 03/02 0701 - 03/03 0700 In: 1036.5 [I.V.:386.5; IV Piggyback:650] Out: 2300 [Urine:2300] Intake/Output this shift: Total I/O In: 12 [I.V.:12] Out: 350 [Urine:350]  General appearance: cooperative and no distress Neurologic: intact Heart: tachy, regular Lungs: rales bilaterally Abdomen: normal findings: soft, non-tender Extremities: edema 2+  Lab Results: Recent Labs    02/09/18 1735 02/10/18 0402  WBC 12.7* 15.0*  HGB 10.2*  11.2* 10.4*  HCT 29.6*  33.0* 30.8*  PLT 153 212   BMET:  Recent Labs    02/09/18 0404 02/09/18 1735 02/10/18 0402  NA 134* 136 134*  K 3.6 3.2* 4.2  CL 97* 93* 95*  CO2 25  --  28  GLUCOSE 116* 129* 136*  BUN 9 9 11   CREATININE 0.72 0.60 0.73  CALCIUM 8.4*  --  8.2*    PT/INR:  Recent Labs    02/08/18 0939  LABPROT 15.9*  INR 1.28   ABG    Component Value Date/Time   PHART 7.433 02/09/2018 0415   HCO3 28.0 02/09/2018 0415   TCO2 29 02/09/2018 1735   ACIDBASEDEF 1.0 02/08/2018 2117   O2SAT 69.7 02/10/2018 0405   CBG (last 3)  Recent Labs    02/07/18 2006 02/08/18 0033 02/08/18 0341  GLUCAP 124* 85 126*    Assessment/Plan: S/P Procedure(s) (LRB): REMOVAL OF IMPELLA LEFT VENTRICULAR ASSIST DEVICE (N/A) TRANSESOPHAGEAL  ECHOCARDIOGRAM (TEE) (N/A) -  CV- tachycardic- will change albuterol to xopenex and start low dose coreg  Co-ox= 68 on milrinone  In SR on amiodarone and digoxin  RESP- continue diuresis  RENAL- diuresed well yesterday, creatinine and lytes OK  Continue IV lasix  ENDO - CBG well controlled  Continue to mobilize as tolerated  Enoxaparin + SCD for DVT prophylaxis   LOS: 7 days    Melrose Nakayama 02/10/2018

## 2018-02-10 NOTE — Evaluation (Signed)
Clinical/Bedside Swallow Evaluation Patient Details  Name: Brittany [name] MRN: [id number] Date of Birth: 1961/10/30  Today's Date: 02/10/2018 Time: SLP Start Time (ACUTE ONLY): 1440 SLP Stop Time (ACUTE ONLY): 1510 SLP Time Calculation (min) (ACUTE ONLY): 30 min  Past Medical History:  Past Medical History:  Diagnosis Date  . Acute systolic congestive heart failure ([place]) 02/03/2018  . Allergy   . Anxiety   . Asthma   . Hypertension   . PAF (paroxysmal atrial fibrillation) ([place])   . Prophylactic measure 08/03/14-08/19/14   Prophyl. cranial radiation 24 Gy  . 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 Small cell lung cancer ([place]) 03/16/2014   Past Surgical History:  Past Surgical History:  Procedure Laterality Date  . CESAREAN SECTION    . CORONARY ARTERY BYPASS GRAFT N/A 02/03/2018   Procedure: CORONARY ARTERY BYPASS GRAFTING (CABG);  Surgeon: Rexene Alberts, MD;  Location: [facility];  Service: Open Heart Surgery;  Laterality: N/A;  Time 3 using left internal mammary artery and endoscopically harvested right saphenous vein  . CORONARY BALLOON ANGIOPLASTY N/A 02/03/2018   Procedure: CORONARY BALLOON ANGIOPLASTY;  Surgeon: Martinique, Peter M, MD;  Location: [facility];  Service: Cardiovascular;  Laterality: N/A;  . CORONARY/GRAFT ACUTE MI REVASCULARIZATION N/A 02/03/2018   Procedure: Coronary/Graft Acute MI Revascularization;  Surgeon: Martinique, Peter M, MD;  Location: [place] [facility];  Service: Cardiovascular;  Laterality: N/A;  . IABP INSERTION N/A 02/03/2018   Procedure: IABP Insertion;  Surgeon: Martinique, Peter M, MD;  Location: [facility];  Service: Cardiovascular;  Laterality: N/A;  . INTRAOPERATIVE TRANSESOPHAGEAL ECHOCARDIOGRAM N/A 02/03/2018   Procedure: INTRAOPERATIVE TRANSESOPHAGEAL ECHOCARDIOGRAM;  Surgeon: Rexene Alberts, MD;  Location: [facility];   Service: Open Heart Surgery;  Laterality: N/A;  . LEFT HEART CATH AND CORONARY ANGIOGRAPHY N/A 02/03/2018   Procedure: LEFT HEART CATH AND CORONARY ANGIOGRAPHY;  Surgeon: Martinique, Peter M, MD;  Location: [facility];  Service: Cardiovascular;  Laterality: N/A;  . MEDIASTINOSCOPY N/A 03/11/2014   Procedure: MEDIASTINOSCOPY;  Surgeon: Melrose Nakayama, MD;  Location: [facility];  Service: Thoracic;  Laterality: N/A;  . PLACEMENT OF IMPELLA LEFT VENTRICULAR ASSIST DEVICE  02/03/2018   Procedure: [place] LEFT VENTRICULAR ASSIST DEVICE LD;  Surgeon: Rexene Alberts, MD;  Location: [facility];  Service: Open Heart Surgery;;  . RIGHT HEART CATH N/A 02/03/2018   Procedure: RIGHT HEART CATH;  Surgeon: Martinique, Peter M, MD;  Location: [place] [facility];  Service: Cardiovascular;  Laterality: N/A;  . TUBAL LIGATION    . 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 She was admitted with NSTEMI and shock, s/p emergent CABG on 02/03/18. Intubated 02/03/18 to 02/08/18.    Assessment / Plan / Recommendation Clinical Impression  Patient presents with signs of an acute, post-extubation dysphagia with immediate coughing and increasing respiratory rate after cup sips of thin liquids suggestive of penetration or aspiration. There are no overt signs of aspiration  with nectar thick liquids or purees. Vocal quality is hoarse, however cough is quite strong. Pt currently on thin clears; recommend continuing clear liquids but thicken to nectar consistency. Crush meds in pudding or yogurt. SLP will  follow up next date for advancement vs need for instrumental assessment.  SLP Visit Diagnosis: Dysphagia, pharyngeal phase (R13.13)    Aspiration Risk  Mild aspiration risk;Moderate aspiration risk    Diet Recommendation Nectar-thick liquid   Liquid Administration via: Cup;Straw Medication Administration: Crushed with puree Supervision: Patient able to self feed Compensations: Slow rate;Small sips/bites    Other  Recommendations Oral Care Recommendations: Oral care QID Other Recommendations: Order thickener from pharmacy   Follow up Recommendations Other (comment)(tbd)      Frequency and Duration min 2x/week  2 weeks       Prognosis Prognosis for Safe Diet Advancement: Good      Swallow Study   General Date of Onset: 02/03/18 HPI: Brittany [name] a 57 y/o woman with a history of PAF,preveious smoker quit 4 years ago, hypertension, 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 She was admitted with NSTEMI and shock, s/p emergent CABG on 02/03/18. Intubated 02/03/18 to 02/08/18.  Type of Study: Bedside Swallow Evaluation Previous Swallow Assessment: none in chart Diet Prior to this Study: Thin liquids(clear liquids) Temperature Spikes Noted: No Respiratory Status: Nasal cannula History of Recent Intubation: Yes Length of Intubations (days): 6 days Date extubated: 02/08/18 Behavior/Cognition: Alert;Cooperative Oral Cavity Assessment: Within Functional Limits Oral Care Completed by SLP: No Oral Cavity - Dentition: Adequate natural dentition Vision: Functional for self-feeding Self-Feeding Abilities: Able to feed self Patient Positioning: Upright in bed Baseline Vocal Quality: Hoarse;Low vocal intensity Volitional Cough: Strong Volitional Swallow: Able to elicit    Oral/Motor/Sensory Function Overall Oral Motor/Sensory Function: Within functional limits   Ice Chips Ice  chips: Within functional limits Presentation: Spoon   Thin Liquid Thin Liquid: Impaired Presentation: Cup;Self Fed Pharyngeal  Phase Impairments: Cough - Immediate;Change in Vital Signs    Nectar Thick Nectar Thick Liquid: Within functional limits Presentation: Cup;Straw;Self Fed   Honey Thick Honey Thick Liquid: Not tested   Puree Puree: Within functional limits Presentation: Self Fed;Spoon   Solid   GO   Brittany [name], [place], CCC-SLP Speech-Language Pathologist [phone] Solid: Not tested        Brittany [name] 02/10/2018,4:17 PM

## 2018-02-10 NOTE — Progress Notes (Signed)
Notified Dr. Sung Amabile that patient UO had declined to 35ml an hour or less since from 12p to 3p.  At the time patient had put out a total of 647ml for the day, part of which was a response to lasix.  CVP of 7 and creatinine of 0.73.  Dr. Sung Amabile instructed to hold on any future lasix dose at this time and to continue to monitor and assess.  Bobette Mo

## 2018-02-11 ENCOUNTER — Inpatient Hospital Stay (HOSPITAL_COMMUNITY): Payer: BLUE CROSS/BLUE SHIELD

## 2018-02-11 ENCOUNTER — Encounter (HOSPITAL_COMMUNITY): Payer: Self-pay | Admitting: Thoracic Surgery (Cardiothoracic Vascular Surgery)

## 2018-02-11 DIAGNOSIS — I351 Nonrheumatic aortic (valve) insufficiency: Secondary | ICD-10-CM

## 2018-02-11 LAB — BASIC METABOLIC PANEL
Anion gap: 12 (ref 5–15)
BUN: 18 mg/dL (ref 6–20)
CHLORIDE: 97 mmol/L — AB (ref 101–111)
CO2: 30 mmol/L (ref 22–32)
CREATININE: 0.66 mg/dL (ref 0.44–1.00)
Calcium: 8.7 mg/dL — ABNORMAL LOW (ref 8.9–10.3)
GFR calc Af Amer: >60 mL/min (ref >60)
GFR calc non Af Amer: >60 mL/min (ref >60)
GLUCOSE: 159 mg/dL — AB (ref 65–99)
Potassium: 3.5 mmol/L (ref 3.5–5.1)
Sodium: 139 mmol/L (ref 135–145)

## 2018-02-11 LAB — ECHOCARDIOGRAM COMPLETE
AVLVOTPG: 7 mmHg
E/e' ratio: 22.16
EWDT: 102 ms
FS: 15 % — AB (ref 28–44)
HEIGHTINCHES: 61 in
IVS/LV PW RATIO, ED: 0.98
LA ID, A-P, ES: 27 mm
LA diam end sys: 27 mm
LA diam index: 1.86 cm/m2
LA vol A4C: 36.7 ml
LA vol index: 24.9 mL/m2
LAVOL: 36.1 mL
LV E/e' medial: 22.16
LV E/e'average: 22.16
LV PW d: 11.5 mm — AB (ref 0.6–1.1)
LV TDI E'LATERAL: 3.7
LV dias vol: 93 mL (ref 46–106)
LV e' LATERAL: 3.7 cm/s
LVDIAVOLIN: 64 mL/m2
LVOT SV: 43 mL
LVOT VTI: 15.1 cm
LVOT area: 2.84 cm2
LVOT peak vel: 132 cm/s
LVOTD: 19 mm
LVSYSVOL: 69 mL — AB (ref 14–42)
LVSYSVOLIN: 47 mL/m2
MV Dec: 102
MV Peak grad: 3 mmHg
MV pk A vel: 50.8 m/s
MV pk E vel: 82 m/s
P 1/2 time: 133 ms
RV LATERAL S' VELOCITY: 10.8 cm/s
RV TAPSE: 11.5 mm
Simpson's disk: 26
Stroke v: 24 ml
TDI e' medial: 6.09
WEIGHTICAEL: 1728.41 [oz_av]

## 2018-02-11 LAB — TYPE AND SCREEN
ABO/RH(D): A POS
Antibody Screen: NEGATIVE
UNIT DIVISION: 0
Unit division: 0

## 2018-02-11 LAB — BPAM RBC
Blood Product Expiration Date: 201903092359
Blood Product Expiration Date: 201903172359
ISSUE DATE / TIME: 201903010815
ISSUE DATE / TIME: 201903011534
UNIT TYPE AND RH: 6200
Unit Type and Rh: 6200

## 2018-02-11 LAB — CBC
HCT: 30.9 % — ABNORMAL LOW (ref 36.0–46.0)
Hemoglobin: 10.4 g/dL — ABNORMAL LOW (ref 12.0–15.0)
MCH: 31 pg (ref 26.0–34.0)
MCHC: 33.7 g/dL (ref 30.0–36.0)
MCV: 92.2 fL (ref 78.0–100.0)
PLATELETS: 301 10*3/uL (ref 150–400)
RBC: 3.35 MIL/uL — ABNORMAL LOW (ref 3.87–5.11)
RDW: 16.6 % — AB (ref 11.5–15.5)
WBC: 17.2 10*3/uL — ABNORMAL HIGH (ref 4.0–10.5)

## 2018-02-11 LAB — COOXEMETRY PANEL
CARBOXYHEMOGLOBIN: 1.7 % — AB (ref 0.5–1.5)
Carboxyhemoglobin: 1.8 % — ABNORMAL HIGH (ref 0.5–1.5)
METHEMOGLOBIN: 1.2 % (ref 0.0–1.5)
Methemoglobin: 1 % (ref 0.0–1.5)
O2 SAT: 55.2 %
O2 SAT: 63 %
TOTAL HEMOGLOBIN: 9.9 g/dL — AB (ref 12.0–16.0)
Total hemoglobin: 10.4 g/dL — ABNORMAL LOW (ref 12.0–16.0)

## 2018-02-11 MED ORDER — SODIUM CHLORIDE 0.9 % IV SOLN
1.0000 g | Freq: Three times a day (TID) | INTRAVENOUS | Status: AC
  Administered 2018-02-11 – 2018-02-21 (×30): 1 g via INTRAVENOUS
  Filled 2018-02-11 (×30): qty 1

## 2018-02-11 MED ORDER — POTASSIUM CHLORIDE 10 MEQ/50ML IV SOLN
10.0000 meq | INTRAVENOUS | Status: AC
  Administered 2018-02-11 (×3): 10 meq via INTRAVENOUS
  Filled 2018-02-11 (×3): qty 50

## 2018-02-11 MED ORDER — FUROSEMIDE 10 MG/ML IJ SOLN
40.0000 mg | Freq: Two times a day (BID) | INTRAMUSCULAR | Status: DC
  Administered 2018-02-11 – 2018-02-12 (×3): 40 mg via INTRAVENOUS
  Filled 2018-02-11 (×3): qty 4

## 2018-02-11 MED ORDER — VANCOMYCIN HCL IN DEXTROSE 750-5 MG/150ML-% IV SOLN
750.0000 mg | Freq: Two times a day (BID) | INTRAVENOUS | Status: DC
  Administered 2018-02-11 – 2018-02-12 (×4): 750 mg via INTRAVENOUS
  Filled 2018-02-11 (×5): qty 150

## 2018-02-11 MED ORDER — PRO-STAT SUGAR FREE PO LIQD
30.0000 mL | Freq: Three times a day (TID) | ORAL | Status: DC
  Administered 2018-02-11 – 2018-02-13 (×3): 30 mL via ORAL
  Filled 2018-02-11 (×4): qty 30

## 2018-02-11 MED ORDER — ASPIRIN EC 81 MG PO TBEC
81.0000 mg | DELAYED_RELEASE_TABLET | Freq: Every day | ORAL | Status: DC
  Administered 2018-02-11 – 2018-02-27 (×17): 81 mg via ORAL
  Filled 2018-02-11 (×17): qty 1

## 2018-02-11 MED ORDER — SODIUM CHLORIDE 0.9 % IV SOLN
INTRAVENOUS | Status: DC
  Administered 2018-02-11: 15:00:00 via INTRAVENOUS

## 2018-02-11 MED ORDER — MILRINONE LACTATE IN DEXTROSE 20-5 MG/100ML-% IV SOLN
0.1250 ug/kg/min | INTRAVENOUS | Status: DC
  Administered 2018-02-11 – 2018-02-16 (×7): 0.25 ug/kg/min via INTRAVENOUS
  Administered 2018-02-18: 0.125 ug/kg/min via INTRAVENOUS
  Filled 2018-02-11 (×6): qty 100

## 2018-02-11 MED ORDER — CLOPIDOGREL BISULFATE 75 MG PO TABS
75.0000 mg | ORAL_TABLET | Freq: Every day | ORAL | Status: DC
  Administered 2018-02-11 – 2018-02-27 (×17): 75 mg via ORAL
  Filled 2018-02-11 (×17): qty 1

## 2018-02-11 MED ORDER — ATORVASTATIN CALCIUM 80 MG PO TABS
80.0000 mg | ORAL_TABLET | Freq: Every day | ORAL | Status: DC
  Administered 2018-02-11 – 2018-02-27 (×16): 80 mg via ORAL
  Filled 2018-02-11 (×16): qty 1

## 2018-02-11 NOTE — Progress Notes (Addendum)
      [address]       [place] [address]             [phone]        CARDIOTHORACIC SURGERY PROGRESS NOTE   R3 Days Post-Op Procedure(s) (LRB): REMOVAL OF IMPELLA LEFT VENTRICULAR ASSIST DEVICE (N/A) TRANSESOPHAGEAL ECHOCARDIOGRAM (TEE) (N/A)  Subjective: No complaints but tachypneic at rest.  No pain.  No nausea. + cough  Objective: Vital signs: BP Readings from Last 1 Encounters:  02/11/18 118/80   Pulse Readings from Last 1 Encounters:  02/11/18 (!) 117   Resp Readings from Last 1 Encounters:  02/11/18 18   Temp Readings from Last 1 Encounters:  02/11/18 98.3 F (36.8 C) (Oral)    Hemodynamics: CVP:  [3 mmHg-14 mmHg] 12 mmHg  Mixed venous co-ox 55%   Physical Exam:  Rhythm:   Sinus tach  Breath sounds: Scattered rales, crackles  Heart sounds:  tachy  Incisions:  Clean and dry  Abdomen:  Soft, non-distended, non-tender  Extremities:  Warm, adequately-perfused    Intake/Output from previous day: 03/03 0701 - 03/04 0700 In: 510.7 [I.V.:260.7; IV Piggyback:250] Out: 936 [Urine:935; Stool:1] Intake/Output this shift: No intake/output data recorded.  Lab Results:  CBC: Recent Labs    02/10/18 0402 02/11/18 0301  WBC 15.0* 17.2*  HGB 10.4* 10.4*  HCT 30.8* 30.9*  PLT 212 301    BMET:  Recent Labs    02/10/18 0402 02/11/18 0301  NA 134* 139  K 4.2 3.5  CL 95* 97*  CO2 28 30  GLUCOSE 136* 159*  BUN 11 18  CREATININE 0.73 0.66  CALCIUM 8.2* 8.7*     PT/INR:   Recent Labs    02/08/18 0939  LABPROT 15.9*  INR 1.28    CBG (last 3)  No results for input(s): GLUCAP in the last 72 hours.  ABG    Component Value Date/Time   PHART 7.433 02/09/2018 0415   PCO2ART 41.7 02/09/2018 0415   PO2ART 72.0 (L) 02/09/2018 0415   HCO3 28.0 02/09/2018 0415   TCO2 29 02/09/2018 1735   ACIDBASEDEF 1.0 02/08/2018 2117   O2SAT 55.2 02/11/2018 0314    CXR: PORTABLE CHEST 1 VIEW  COMPARISON:  Radiograph yesterday at 0501  hour  FINDINGS: Previous right internal jugular sheath has been removed. Post median sternotomy and CABG. Unchanged cardiomegaly and mediastinal contours. Epicardial leads project over the lower mediastinum. Unchanged interstitial opacities suggesting pulmonary edema. Small pleural effusions and basilar atelectasis, similar to prior exam. No pneumothorax.  IMPRESSION: 1. Post CABG with unchanged cardiomegaly and pulmonary edema. 2. Small pleural effusions and bibasilar atelectasis are also unchanged. 3. Removal of right internal jugular sheath, otherwise unchanged exam.   Electronically Signed   By: Jeb Levering M.D.   On: 02/11/2018 03:22   Assessment/Plan: S/P Procedure(s) (LRB): REMOVAL OF IMPELLA LEFT VENTRICULAR ASSIST DEVICE (N/A) TRANSESOPHAGEAL ECHOCARDIOGRAM (TEE) (N/A)  HR and RR up this morning.  Co-ox down 55% and CVP 12 O2 sats 89-97% on HFNC, CXR most c/w CHF WBC up 17k.  No fevers but + cough  I suspect that we should restart milrinone - will discuss w/ CHF team - may need to hold Entresto Will also start empiric Vanc + Fortaz for possible HCAP, although I am more suspicious that CHF is the primary problem DAPT Statin Mobilize Nebs and pulmonary toilet  Rexene Alberts, MD 02/11/2018 8:04 AM

## 2018-02-11 NOTE — Progress Notes (Signed)
  Echocardiogram 2D Echocardiogram has been performed.  [name] M 02/11/2018, 1:09 PM

## 2018-02-11 NOTE — Progress Notes (Addendum)
Advanced Heart Failure Rounding Note  PCP-Cardiologist: No primary care provider on file.   Subjective:    Events S/p emergent CABG 02/03/18 IABP removed 02/04/18 Impella out 02/08/18 Extubated 02/08/18  This morning milrinone was increased to 0.25 mcg and IV lasix restarted. CVP 11-12.   More SOB and uncomfortable this am . RR in 30s. WBC climbing. Afebrile.   TEE 02/03/18 LVEF 20%, Mild to Mod AR, Mild/Mod MR, RV normal.   Objective:   Weight Range: 108 lb 0.4 oz (49 kg) Body mass index is 20.41 kg/m.   Vital Signs:   Temp:  [98.3 F (36.8 C)-98.8 F (37.1 C)] 98.3 F (36.8 C) (03/04 0727) Pulse Rate:  [109-124] 120 (03/04 0800) Resp:  [17-33] 33 (03/04 0830) BP: (108-144)/(60-100) 130/89 (03/04 0830) SpO2:  [80 %-100 %] 85 % (03/04 0853) FiO2 (%):  [100 %] 100 % (03/04 0900) Weight:  [108 lb 0.4 oz (49 kg)] 108 lb 0.4 oz (49 kg) (03/04 0300) Last BM Date: (pta)  Weight change: Filed Weights   02/09/18 0630 02/10/18 0500 02/11/18 0300  Weight: 118 lb 2.7 oz (53.6 kg) 111 lb 8.8 oz (50.6 kg) 108 lb 0.4 oz (49 kg)    Intake/Output:   Intake/Output Summary (Last 24 hours) at 02/11/2018 1022 Last data filed at 02/11/2018 0812 Gross per 24 hour  Intake 534.67 ml  Output 501 ml  Net 33.67 ml    Physical Exam   CVP 11-12  General:  Dyspneic  HEENT: normal Neck: supple. JVP 11-12. Carotids 2+ bilat; no bruits. No lymphadenopathy or thryomegaly appreciated. Cor: PMI nondisplaced. Tachy, regular rate & rhythm. No rubs, gallops or murmurs. Lungs: Crackles in the bases. On 100% NRB Abdomen: soft, nontender, nondistended. No hepatosplenomegaly. No bruits or masses. Good bowel sounds. Extremities: cool, no cyanosis, clubbing, rash, edema Neuro: alert & orientedx3, cranial nerves grossly intact. moves all 4 extremities w/o difficulty. Affect pleasant   Telemetry  Sinus Tach 120s. Personally checked   EKG    No new tracings.    Labs    CBC Recent Labs   02/10/18 0402 02/11/18 0301  WBC 15.0* 17.2*  HGB 10.4* 10.4*  HCT 30.8* 30.9*  MCV 91.4 92.2  PLT 212 510   Basic Metabolic Panel Recent Labs    02/09/18 0404 02/09/18 1735 02/10/18 0402 02/11/18 0301  NA 134* 136 134* 139  K 3.6 3.2* 4.2 3.5  CL 97* 93* 95* 97*  CO2 25  --  28 30  GLUCOSE 116* 129* 136* 159*  BUN 9 9 11 18   CREATININE 0.72 0.60 0.73 0.66  CALCIUM 8.4*  --  8.2* 8.7*  MG 2.1 1.8  --   --    Liver Function Tests No results for input(s): AST, ALT, ALKPHOS, BILITOT, PROT, ALBUMIN in the last 72 hours. No results for input(s): LIPASE, AMYLASE in the last 72 hours. Cardiac Enzymes No results for input(s): CKTOTAL, CKMB, CKMBINDEX, TROPONINI in the last 72 hours.  BNP: BNP (last 3 results) Recent Labs    02/03/18 0853  BNP 3,127.4*    ProBNP (last 3 results) No results for input(s): PROBNP in the last 8760 hours.   D-Dimer No results for input(s): DDIMER in the last 72 hours. Hemoglobin A1C No results for input(s): HGBA1C in the last 72 hours. Fasting Lipid Panel No results for input(s): CHOL, HDL, LDLCALC, TRIG, CHOLHDL, LDLDIRECT in the last 72 hours. Thyroid Function Tests No results for input(s): TSH, T4TOTAL, T3FREE, THYROIDAB in  the last 72 hours.  Invalid input(s): FREET3  Other results:   Imaging    Dg Chest Port 1 View  Result Date: 02/11/2018 CLINICAL DATA:  Status post CABG. Increasing shortness of breath tonight. EXAM: PORTABLE CHEST 1 VIEW COMPARISON:  Radiograph yesterday at 0501 hour FINDINGS: Previous right internal jugular sheath has been removed. Post median sternotomy and CABG. Unchanged cardiomegaly and mediastinal contours. Epicardial leads project over the lower mediastinum. Unchanged interstitial opacities suggesting pulmonary edema. Small pleural effusions and basilar atelectasis, similar to prior exam. No pneumothorax. IMPRESSION: 1. Post CABG with unchanged cardiomegaly and pulmonary edema. 2. Small pleural effusions  and bibasilar atelectasis are also unchanged. 3. Removal of right internal jugular sheath, otherwise unchanged exam. Electronically Signed   By: Jeb Levering M.D.   On: 02/11/2018 03:22   [place] Ekg Site Rite  Result Date: 02/10/2018 If Site Rite image not attached, placement could not be confirmed due to current cardiac rhythm.    Medications:     Scheduled Medications: . acetaminophen  1,000 mg Oral Q6H  . amiodarone  200 mg Oral BID  . aspirin EC  81 mg Oral Daily  . atorvastatin  80 mg Oral q1800  . bisacodyl  10 mg Oral Daily   Or  . bisacodyl  10 mg Rectal Daily  . Chlorhexidine Gluconate Cloth  6 each Topical Daily  . Chlorhexidine Gluconate Cloth  6 each Topical Daily  . clopidogrel  75 mg Oral Daily  . digoxin  0.125 mg Oral Daily  . docusate sodium  200 mg Oral Daily  . enoxaparin (LOVENOX) injection  30 mg Subcutaneous Q24H  . furosemide  40 mg Intravenous BID  . levalbuterol  0.63 mg Nebulization Q6H  . mouth rinse  15 mL Mouth Rinse BID  . pantoprazole  40 mg Oral Daily  . sodium chloride flush  10-40 mL Intracatheter Q12H  . spironolactone  12.5 mg Oral Daily    Infusions: . cefTAZidime (FORTAZ)  IV Stopped (02/11/18 0926)  . milrinone 0.25 mcg/kg/min (02/11/18 0829)  . potassium chloride 10 mEq (02/11/18 1010)  . 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 She was admitted with NSTEMI and shock.  Emergent cath 02/03/18 showed LAD 100% stenosed, LCx 95% stenosed. Taken for emergent CABG 02/03/18  Assessment/Plan   1. NSTEMI/STEMI s/p Emergent CABG  02/03/18 - New LBBB on ECG - cath with severe 2v CAD as above with likely severe LV dysfunction/ICM - s/p Emergent CABG 02/03/18. On aspirin, plavix and atorvastatin.  - No s/s ischemia  2. Acute systolic HF -> cardiogenic shock in setting of #1 - TEE 02/03/18 LVEF 20%, Mild to Mod AR, Mild/Mod MR, RV normal.  - Impella out 3/1 - No change in CXR.  -CVP up. Increased WOB. Restarting lasix 40 mg twice a day  -Restarting milrinone 0.25 mcg.  - Continue Entresto  - No bb. Continue digoxin 0.125 mg daily.  - Discussed at [facility] 02/04/18 for potential of durable VAD placement.    3. Acute respiratory failure d/t pulmonary edema - Extubated 3/1.  - WBC trending up.  - Started on vanc and fortaz.  -  Off ancef.  - May need Bipap   4. Tachycardia -In Sinus Tach.  -- No BB yet with cardiogenic shock.  - Watch closely for developins sepsis - Goal K > 4.0 and Mg > 2.0.  5. PAF - Previously not on AC. - CHA2DS2/VASc is at least 4. (CHF, Vasc disease, HTN, Female)  -In Sinus Tach. Will need to consider AC.  - On lovenox for DVT.  6. H/o SCLC - s/p treatment 2015. Lost to f/u since 04/2015. Will need f/u with oncology (Dr. Julien Nordmann) as outpatient. - No change.   Medication concerns reviewed with patient and pharmacy team. Barriers identified: None at this time.   Length of Stay: [place], NP  02/11/2018, 10:22 AM  Advanced Heart Failure Team Pager [phone] (M-F; 7a - 4p)  Please contact [facility] for night-coverage after hours (4p -7a ) and weekends on [email or web address]   Agree.  Milrinone stopped yesterday. She looks much worse today. Very tachypneic. WBC climbing. Extremities cool. Co-ox 55%. HR in 120s. Sinus.   Exam:  Ill appearing. Tachypneic JVP 10 Cor tachy regular + s3 Lungs crackles Ab soft NT Ext cool trace edema   She looks much worse today. Co-ox low. WBC rising. Has volume on board. Will restart milrinone.  Start broad spectrum abx. Repeat echo to look at  AI. Diurese with IV lasix. Watch closely throughout the day. May need reintubation. D/w Dr. Roxy Manns.   CRITICAL CARE Performed by: Glori Bickers  Total critical care time: 35 minutes  Critical care time was exclusive of separately billable procedures and treating other patients.  Critical care was necessary to treat or prevent imminent or life-threatening deterioration.  Critical care was time spent personally by me (independent of midlevel providers or residents) on the following activities: development of treatment plan with patient and/or surrogate as well as nursing, discussions with consultants, evaluation of patient's response to treatment, examination of patient, obtaining history from patient or surrogate, ordering and performing treatments and interventions, ordering and review of laboratory studies, ordering and review of radiographic studies, pulse oximetry and re-evaluation of patient's condition.  Glori Bickers, MD  12:17 PM

## 2018-02-11 NOTE — Progress Notes (Addendum)
Advanced Heart Failure Rounding Note  PCP-Cardiologist: No primary care provider on file.   Subjective:    Events S/p emergent CABG 02/03/18 IABP removed 02/04/18 Impella out 02/08/18 Extubated 02/08/18  Sitting up in chair. Feels weak and tired. Diuresed yesterday. Weight down 7 pounds. Co-ox 70% on milrinone 0.125. No CVP measured.   Remains tachycardic - sinus 110-120. WBC climbing. 12.7-> 15.0 CXR stable. (Personally reviewed) No fever. Having cough when drinking or eating.   TEE 02/03/18 LVEF 20%, Mild to Mod AR, Mild/Mod MR, RV normal.   Objective:   Weight Range: 49 kg (108 lb 0.4 oz) Body mass index is 20.41 kg/m.   Vital Signs:   Temp:  [98.2 F (36.8 C)-99.3 F (37.4 C)] 98.2 F (36.8 C) (03/04 2028) Pulse Rate:  [59-130] 116 (03/04 2100) Resp:  [18-40] 26 (03/04 2100) BP: (111-145)/(60-100) 128/72 (03/04 2100) SpO2:  [73 %-100 %] 95 % (03/04 2100) FiO2 (%):  [50 %-100 %] 50 % (03/04 2100) Weight:  [49 kg (108 lb 0.4 oz)] 49 kg (108 lb 0.4 oz) (03/04 0300) Last BM Date: (pta)  Weight change: Filed Weights   02/09/18 0630 02/10/18 0500 02/11/18 0300  Weight: 53.6 kg (118 lb 2.7 oz) 50.6 kg (111 lb 8.8 oz) 49 kg (108 lb 0.4 oz)    Intake/Output:   Intake/Output Summary (Last 24 hours) at 02/11/2018 2145 Last data filed at 02/11/2018 1900 Gross per 24 hour  Intake 912.24 ml  Output 1441 ml  Net -528.76 ml    Physical Exam    General:  Sitting up in chair. Weak. Hoarse  No resp difficulty HEENT: normal Neck: supple. RIJ introducer. Carotids 2+ bilat; no bruits. No lymphadenopathy or thryomegaly appreciated. Cor: PMI nondisplaced. Sternal wound ok. Tachy regular No rubs, gallops or murmurs. Lungs: diffuse rhonchi Abdomen: soft, nontender, nondistended. No hepatosplenomegaly. No bruits or masses. Good bowel sounds. Extremities: no cyanosis, clubbing, rash, edema warm Neuro: alert & orientedx3, cranial nerves grossly intact. moves all 4 extremities w/o  difficulty. Affect pleasant   Telemetry   Sinus tach 110-120, + PVCs, personally reviewed.   EKG    No new tracings.    Labs    CBC Recent Labs    02/10/18 0402 02/11/18 0301  WBC 15.0* 17.2*  HGB 10.4* 10.4*  HCT 30.8* 30.9*  MCV 91.4 92.2  PLT 212 789   Basic Metabolic Panel Recent Labs    02/09/18 0404 02/09/18 1735 02/10/18 0402 02/11/18 0301  NA 134* 136 134* 139  K 3.6 3.2* 4.2 3.5  CL 97* 93* 95* 97*  CO2 25  --  28 30  GLUCOSE 116* 129* 136* 159*  BUN 9 9 11 18   CREATININE 0.72 0.60 0.73 0.66  CALCIUM 8.4*  --  8.2* 8.7*  MG 2.1 1.8  --   --    Liver Function Tests No results for input(s): AST, ALT, ALKPHOS, BILITOT, PROT, ALBUMIN in the last 72 hours. No results for input(s): LIPASE, AMYLASE in the last 72 hours. Cardiac Enzymes No results for input(s): CKTOTAL, CKMB, CKMBINDEX, TROPONINI in the last 72 hours.  BNP: BNP (last 3 results) Recent Labs    02/03/18 0853  BNP 3,127.4*    ProBNP (last 3 results) No results for input(s): PROBNP in the last 8760 hours.   D-Dimer No results for input(s): DDIMER in the last 72 hours. Hemoglobin A1C No results for input(s): HGBA1C in the last 72 hours. Fasting Lipid Panel No results for input(s):  CHOL, HDL, LDLCALC, TRIG, CHOLHDL, LDLDIRECT in the last 72 hours. Thyroid Function Tests No results for input(s): TSH, T4TOTAL, T3FREE, THYROIDAB in the last 72 hours.  Invalid input(s): FREET3  Other results:   Imaging    Dg Chest Port 1 View  Result Date: 02/11/2018 CLINICAL DATA:  Status post CABG. Increasing shortness of breath tonight. EXAM: PORTABLE CHEST 1 VIEW COMPARISON:  Radiograph yesterday at 0501 hour FINDINGS: Previous right internal jugular sheath has been removed. Post median sternotomy and CABG. Unchanged cardiomegaly and mediastinal contours. Epicardial leads project over the lower mediastinum. Unchanged interstitial opacities suggesting pulmonary edema. Small pleural effusions and  basilar atelectasis, similar to prior exam. No pneumothorax. IMPRESSION: 1. Post CABG with unchanged cardiomegaly and pulmonary edema. 2. Small pleural effusions and bibasilar atelectasis are also unchanged. 3. Removal of right internal jugular sheath, otherwise unchanged exam. Electronically Signed   By: Jeb Levering M.D.   On: 02/11/2018 03:22     Medications:     Scheduled Medications: . acetaminophen  1,000 mg Oral Q6H  . amiodarone  200 mg Oral BID  . aspirin EC  81 mg Oral Daily  . atorvastatin  80 mg Oral q1800  . bisacodyl  10 mg Oral Daily   Or  . bisacodyl  10 mg Rectal Daily  . Chlorhexidine Gluconate Cloth  6 each Topical Daily  . Chlorhexidine Gluconate Cloth  6 each Topical Daily  . clopidogrel  75 mg Oral Daily  . digoxin  0.125 mg Oral Daily  . docusate sodium  200 mg Oral Daily  . enoxaparin (LOVENOX) injection  30 mg Subcutaneous Q24H  . feeding supplement (PRO-STAT SUGAR FREE 64)  30 mL Oral TID WC & HS  . furosemide  40 mg Intravenous BID  . levalbuterol  0.63 mg Nebulization Q6H  . mouth rinse  15 mL Mouth Rinse BID  . pantoprazole  40 mg Oral Daily  . sodium chloride flush  10-40 mL Intracatheter Q12H  . spironolactone  12.5 mg Oral Daily    Infusions: . sodium chloride 20 mL/hr at 02/11/18 1900  . cefTAZidime (FORTAZ)  IV Stopped (02/11/18 1354)  . milrinone 0.25 mcg/kg/min (02/11/18 1900)  . 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 She was admitted with NSTEMI and shock.  Emergent cath 02/03/18 showed LAD 100% stenosed, LCx 95%  stenosed. Taken for emergent CABG 02/03/18  Assessment/Plan   1. NSTEMI/STEMI s/p Emergent CABG 02/03/18 - New LBBB on ECG - cath with severe 2v CAD as above with likely severe LV dysfunction/ICM - s/p Emergent CABG 02/03/18.  - No s/s ischemia  2. Acute systolic HF -> cardiogenic shock in setting of #1 - TEE 02/03/18 LVEF 20%, Mild to Mod AR, Mild/Mod MR, RV normal.  - Impella out 3/1 - Worsening pulmonary edema on CXR and clinically yesterday. Now improved with IV lasix overnigh - Will change to po  - Co-ox 70% on milrinone 0.125. NE off. Stop milrinone - Continue Entresto  - Will stop b-blocker that was added for now. Add digoxin  - Remove introducer and place PICC.  - Discussed at [facility] 02/04/18 for potential of durable  VAD placement.    3. Acute respiratory failure d/t pulmonary edema - Extubated 3/1.  - Very tenuous. Suspect she is aspirating - WBC climbing - Will order speech eval - Educated on use of IS personally  - Low threshold to broaden abx  4. Tachycardia - Remains in sinus tach by tele.  - No BB yet with cardiogenic shock.  - Watch closely for developins sepsis - Goal K > 4.0 and Mg > 2.0.  5. PAF - Previously not on AC. - CHA2DS2/VASc is at least 4. (CHF, Vasc disease, HTN, Female)  - Now in sinus. Will need AC prior to d/c  6. H/o SCLC - s/p treatment 2015. Lost to f/u since 04/2015. Will need f/u with oncology (Dr. Julien Nordmann) as outpatient. - No change.   Medication concerns reviewed with patient and pharmacy team. Barriers identified: None at this time.  CRITICAL CARE Performed by: Glori Bickers  Total critical care time: 35 minutes  Critical care time was exclusive of separately billable procedures and treating other patients.  Critical care was necessary to treat or prevent imminent or life-threatening deterioration.  Critical care was time spent personally by me (independent of midlevel providers or residents) on the following activities:  development of treatment plan with patient and/or surrogate as well as nursing, discussions with consultants, evaluation of patient's response to treatment, examination of patient, obtaining history from patient or surrogate, ordering and performing treatments and interventions, ordering and review of laboratory studies, ordering and review of radiographic studies, pulse oximetry and re-evaluation of patient's condition.    Length of Stay: [place], MD  02/10/18  Advanced Heart Failure Team Pager [phone] (M-F; [place])  Please contact [facility] for night-coverage after hours (4p -7a ) and weekends on [email or web address]

## 2018-02-11 NOTE — Progress Notes (Signed)
Patient ID: Brittany [name], female   DOB: 1961-09-08, 57 y.o.   MRN: [id number] [facility] Evening Rounds:  Put back on Milrinone 0.25 this am due to low Co-ox of 55, oliguria, respiratory distress. Co-ox increased to 63. Diuresing well this afternoon.  sats 95% on 15 HFNC. BP 127/71, sinus tach 120's

## 2018-02-11 NOTE — Progress Notes (Signed)
Pharmacy Antibiotic Note  Brittany [name] is a 57 y.o. female admitted on 02/03/2018 with SOB s/p urgent CABG with need for impella> removed 3/1.  3/4 increased WBC 17, increased SOB, ST 120s Cr stable 0.6 afebrile.   Pharmacy has been consulted for vancomycin and ceftazidime dosing.  Plan: Vancomycin 750mg  iv q12h Ceftazidime 1gm IV q8h  Height: 5\' 1"  (154.9 cm) Weight: 108 lb 0.4 oz (49 kg) IBW/kg (Calculated) : 47.8  Temp (24hrs), Avg:98.5 F (36.9 C), Min:98.3 F (36.8 C), Max:98.8 F (37.1 C)  Recent Labs  Lab 02/08/18 2109 02/09/18 0404 02/09/18 1735 02/10/18 0402 02/11/18 0301  WBC 12.7* 12.9* 12.7* 15.0* 17.2*  CREATININE 0.79 0.72 0.60 0.73 0.66    Estimated Creatinine Clearance: 58.5 mL/min (by C-G formula based on SCr of 0.66 mg/dL).    Allergies  Allergen Reactions  . Iodinated Diagnostic Agents Hives and Rash  . Latex Hives and Other (See Comments)    Skin irritation  . Pineapple Hives  . Sulfa Antibiotics Hives and Rash  . Codeine Nausea And Vomiting    Antimicrobials this admission: 2/24 vanc x 1 3/4 > 2/24 zosyn x 1 2/24 Cefuroxime x 4 doses post op 3/1 ancef  Post op>3/4  3/4 ceftaz >  Dose adjustments this admission:   Microbiology results:   Bonnita Nasuti Pharm.D. CPP, BCPS Clinical Pharmacist [phone] 02/11/2018 11:02 AM

## 2018-02-12 ENCOUNTER — Inpatient Hospital Stay (HOSPITAL_COMMUNITY): Payer: BLUE CROSS/BLUE SHIELD

## 2018-02-12 LAB — URINALYSIS, ROUTINE W REFLEX MICROSCOPIC
BILIRUBIN URINE: NEGATIVE
Glucose, UA: NEGATIVE mg/dL
Ketones, ur: NEGATIVE mg/dL
Leukocytes, UA: NEGATIVE
Nitrite: NEGATIVE
PROTEIN: NEGATIVE mg/dL
SQUAMOUS EPITHELIAL / LPF: NONE SEEN
Specific Gravity, Urine: 1.006 (ref 1.005–1.030)
pH: 5 (ref 5.0–8.0)

## 2018-02-12 LAB — COOXEMETRY PANEL
CARBOXYHEMOGLOBIN: 1.4 % (ref 0.5–1.5)
Carboxyhemoglobin: 1.8 % — ABNORMAL HIGH (ref 0.5–1.5)
METHEMOGLOBIN: 1.5 % (ref 0.0–1.5)
Methemoglobin: 1.9 % — ABNORMAL HIGH (ref 0.0–1.5)
O2 Saturation: 52 %
O2 Saturation: 60.3 %
TOTAL HEMOGLOBIN: 7.6 g/dL — AB (ref 12.0–16.0)
Total hemoglobin: 10.7 g/dL — ABNORMAL LOW (ref 12.0–16.0)

## 2018-02-12 LAB — COMPREHENSIVE METABOLIC PANEL
ALBUMIN: 2.5 g/dL — AB (ref 3.5–5.0)
ALT: 33 U/L (ref 14–54)
AST: 56 U/L — ABNORMAL HIGH (ref 15–41)
Alkaline Phosphatase: 122 U/L (ref 38–126)
Anion gap: 11 (ref 5–15)
BUN: 23 mg/dL — ABNORMAL HIGH (ref 6–20)
CALCIUM: 8.5 mg/dL — AB (ref 8.9–10.3)
CO2: 30 mmol/L (ref 22–32)
CREATININE: 0.71 mg/dL (ref 0.44–1.00)
Chloride: 98 mmol/L — ABNORMAL LOW (ref 101–111)
GFR calc Af Amer: >60 mL/min (ref >60)
GFR calc non Af Amer: >60 mL/min (ref >60)
GLUCOSE: 153 mg/dL — AB (ref 65–99)
Potassium: 3.5 mmol/L (ref 3.5–5.1)
SODIUM: 139 mmol/L (ref 135–145)
Total Bilirubin: 2.8 mg/dL — ABNORMAL HIGH (ref 0.3–1.2)
Total Protein: 5.3 g/dL — ABNORMAL LOW (ref 6.5–8.1)

## 2018-02-12 LAB — CBC
HCT: 25.4 % — ABNORMAL LOW (ref 36.0–46.0)
Hemoglobin: 8.6 g/dL — ABNORMAL LOW (ref 12.0–15.0)
MCH: 31 pg (ref 26.0–34.0)
MCHC: 33.9 g/dL (ref 30.0–36.0)
MCV: 91.7 fL (ref 78.0–100.0)
Platelets: 324 10*3/uL (ref 150–400)
RBC: 2.77 MIL/uL — AB (ref 3.87–5.11)
RDW: 16.7 % — ABNORMAL HIGH (ref 11.5–15.5)
WBC: 17.2 10*3/uL — AB (ref 4.0–10.5)

## 2018-02-12 MED ORDER — FUROSEMIDE 10 MG/ML IJ SOLN
60.0000 mg | Freq: Two times a day (BID) | INTRAMUSCULAR | Status: DC
  Administered 2018-02-12: 60 mg via INTRAVENOUS
  Filled 2018-02-12 (×2): qty 6

## 2018-02-12 MED ORDER — LEVALBUTEROL HCL 1.25 MG/0.5ML IN NEBU
1.2500 mg | INHALATION_SOLUTION | Freq: Four times a day (QID) | RESPIRATORY_TRACT | Status: DC
  Administered 2018-02-12 – 2018-02-14 (×6): 1.25 mg via RESPIRATORY_TRACT
  Filled 2018-02-12 (×6): qty 0.5

## 2018-02-12 MED ORDER — POTASSIUM CHLORIDE 10 MEQ/50ML IV SOLN
10.0000 meq | INTRAVENOUS | Status: AC
  Administered 2018-02-12 (×3): 10 meq via INTRAVENOUS
  Filled 2018-02-12 (×2): qty 50

## 2018-02-12 MED ORDER — POTASSIUM CHLORIDE 10 MEQ/50ML IV SOLN
10.0000 meq | INTRAVENOUS | Status: AC
  Administered 2018-02-12 (×3): 10 meq via INTRAVENOUS
  Filled 2018-02-12 (×3): qty 50

## 2018-02-12 MED ORDER — ENOXAPARIN SODIUM 40 MG/0.4ML ~~LOC~~ SOLN
40.0000 mg | SUBCUTANEOUS | Status: DC
  Administered 2018-02-13 – 2018-02-27 (×15): 40 mg via SUBCUTANEOUS
  Filled 2018-02-12 (×15): qty 0.4

## 2018-02-12 MED ORDER — LEVALBUTEROL HCL 0.63 MG/3ML IN NEBU
0.6300 mg | INHALATION_SOLUTION | RESPIRATORY_TRACT | Status: DC | PRN
  Administered 2018-02-16: 0.63 mg via RESPIRATORY_TRACT
  Filled 2018-02-12 (×2): qty 3

## 2018-02-12 MED ORDER — BOOST / RESOURCE BREEZE PO LIQD CUSTOM
1.0000 | Freq: Three times a day (TID) | ORAL | Status: DC
  Administered 2018-02-12 – 2018-02-13 (×3): 1 via ORAL

## 2018-02-12 MED ORDER — POTASSIUM CHLORIDE 10 MEQ/50ML IV SOLN
INTRAVENOUS | Status: AC
  Filled 2018-02-12: qty 50

## 2018-02-12 NOTE — Progress Notes (Signed)
Nutrition Follow-up  DOCUMENTATION CODES:   Not applicable  INTERVENTION:   -Recommend insertion of Cortrak tube with initiation of enteral nutrition. Cortrak service available tomorrow 3/6. Inadequate nutrition x 9 days  -Boost Breeze po TID, each supplement provides 250 kcal and 9 grams of protein. RN to thicken to nectar consistency  NUTRITION DIAGNOSIS:   Inadequate oral intake related to inability to eat as evidenced by NPO status.  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 Pt required emergent CABG 02/03/2018 with Impella placement. Pt remains intubated post-operatively.   3/1 Extubated 3/3 Diet advanced to CL, Nectar Thick 3/4 On and off Bipap  Pt alert, off Bipap this AM, hungry, weak. Tolerating Nectar Thick Liquids. Current diet very limited.  Pt with minimal nutrition since admission (x 9 days)  Pt evaluated by SLP today, diet unable to be advanced and considering MBSS/FEES tomorrow if able. Post SLP eval, pt fatigued with increased work of breathing and placed back on BiPap.   Paged MD Roxy Manns to discuss nutrition poc. MD  to consider Cortrak placement, to readdress tomorrow.   With current diet order and current medical status, very limited with regards to nutrition interventions.   Weight down since admission, net postive per I/O flow sheet  Labs: reviewed Meds: lasix   Diet Order:  Diet clear liquid Room service appropriate? Yes; Fluid consistency: Nectar Thick  EDUCATION NEEDS:   Not appropriate for education at this time  Skin:  Skin Assessment: Skin Integrity Issues: Skin Integrity Issues:: Incisions Incisions: chest and R leg  Last BM:   3/5  Height:   Ht Readings from Last 1 Encounters:  02/06/18 5' 1" (1.549 m)    Weight:   Wt Readings from Last 1 Encounters:  02/12/18 113 lb 8 oz (51.5 kg)    Ideal Body Weight:  47.7 kg  BMI:  Body mass index is 21.45 kg/m.  Estimated Nutritional Needs:   Kcal:  1500-1700 kcals  Protein:  75-85  Fluid:  >/= 1.5 L/d   Kerman Passey MS, RD, LDN, CNSC [phone] Pager  [phone] Weekend/On-Call Pager

## 2018-02-12 NOTE — Progress Notes (Signed)
      [address]       [place] [address]             [phone]        CARDIOTHORACIC SURGERY PROGRESS NOTE   R4 Days Post-Op Procedure(s) (LRB): REMOVAL OF IMPELLA LEFT VENTRICULAR ASSIST DEVICE (N/A) TRANSESOPHAGEAL ECHOCARDIOGRAM (TEE) (N/A)  Subjective: Feels better.  Some dyspnea and tachypnea earlier while up in chair, but currently breathing comfortably  Objective: Vital signs: BP Readings from Last 1 Encounters:  02/12/18 (!) 99/55   Pulse Readings from Last 1 Encounters:  02/12/18 (!) 114   Resp Readings from Last 1 Encounters:  02/12/18 (!) 24   Temp Readings from Last 1 Encounters:  02/12/18 (!) 97.1 F (36.2 C) (Axillary)    Hemodynamics: CVP:  [5 mmHg-11 mmHg] 6 mmHg  Mixed venous co-ox 52% and repeat 60%   Physical Exam:  Rhythm:   Sinus tach  Breath sounds: coarse  Heart sounds:  tachy  Incisions:  Clean and dry  Abdomen:  Soft, non-distended, non-tender, + BM  Extremities:  Warm, well-perfused    Intake/Output from previous day: 03/04 0701 - 03/05 0700 In: 1606.6 [P.O.:240; I.V.:416.6; IV Piggyback:950] Out: 6060 [Urine:1475] Intake/Output this shift: Total I/O In: 200 [IV Piggyback:200] Out: 240 [Urine:240]  Lab Results:  CBC: Recent Labs    02/11/18 0301 02/12/18 0300  WBC 17.2* 17.2*  HGB 10.4* 8.6*  HCT 30.9* 25.4*  PLT 301 324    BMET:  Recent Labs    02/11/18 0301 02/12/18 0300  NA 139 139  K 3.5 3.5  CL 97* 98*  CO2 30 30  GLUCOSE 159* 153*  BUN 18 23*  CREATININE 0.66 0.71  CALCIUM 8.7* 8.5*     PT/INR:  No results for input(s): LABPROT, INR in the last 72 hours.  CBG (last 3)  No results for input(s): GLUCAP in the last 72 hours.  ABG    Component Value Date/Time   PHART 7.433 02/09/2018 0415   PCO2ART 41.7 02/09/2018 0415   PO2ART 72.0 (L) 02/09/2018 0415   HCO3 28.0 02/09/2018 0415   TCO2 29 02/09/2018 1735   ACIDBASEDEF 1.0 02/08/2018 2117   O2SAT 60.3 02/12/2018 0908     CXR: PORTABLE CHEST 1 VIEW  COMPARISON:  02/11/2018  FINDINGS: Prior CABG. Cardiomegaly. Mild bilateral airspace opacities, increasing slightly since prior study compatible with worsening edema/CHF. No visible significant effusions or acute bony abnormality.  IMPRESSION: Mild worsening CHF.   Electronically Signed   By: Rolm Baptise M.D.   On: 02/12/2018 07:05  Assessment/Plan: S/P Procedure(s) (LRB): REMOVAL OF IMPELLA LEFT VENTRICULAR ASSIST DEVICE (N/A) TRANSESOPHAGEAL ECHOCARDIOGRAM (TEE) (N/A)  Still tenuous w/ acute systolic CHF Maintaining NSR - sinus tach on milrinone 0.25, CVP low Marginal oxygenation w/ CXR demonstrating mild CHF and underlying chronic lung disease Leukocytosis w/out fever, on empiric ABx for possible HCAP Expected post op acute blood loss anemia, Hgb down today 8.6   Adjustments in Rx per advanced heart failure team   Rexene Alberts, MD 02/12/2018 9:50 AM

## 2018-02-12 NOTE — Plan of Care (Signed)
  Progressing Education: Knowledge of General Education information will improve 02/12/2018 2212 - Progressing by Arletta Bale, RN Health Behavior/Discharge Planning: Ability to manage health-related needs will improve 02/12/2018 2212 - Progressing by Arletta Bale, RN Clinical Measurements: Will remain free from infection 02/12/2018 2212 - Progressing by Arletta Bale, RN Cardiovascular complication will be avoided 02/12/2018 2212 - Progressing by Arletta Bale, RN Nutrition: Adequate nutrition will be maintained 02/12/2018 2212 - Progressing by Arletta Bale, RN Coping: Level of anxiety will decrease 02/12/2018 2212 - Progressing by Arletta Bale, RN Elimination: Will not experience complications related to bowel motility 02/12/2018 2212 - Progressing by Arletta Bale, RN Will not experience complications related to urinary retention 02/12/2018 2212 - Progressing by Arletta Bale, RN Pain Managment: General experience of comfort will improve 02/12/2018 2212 - Progressing by Arletta Bale, RN Safety: Ability to remain free from injury will improve 02/12/2018 2212 - Progressing by Arletta Bale, RN Skin Integrity: Risk for impaired skin integrity will decrease 02/12/2018 2212 - Progressing by Arletta Bale, RN Education: Ability to demonstrate proper wound care will improve 02/12/2018 2212 - Progressing by Arletta Bale, RN Knowledge of disease or condition will improve 02/12/2018 2212 - Progressing by Arletta Bale, RN Knowledge of the prescribed therapeutic regimen will improve 02/12/2018 2212 - Progressing by Arletta Bale, RN Clinical Measurements: Postoperative complications will be avoided or minimized 02/12/2018 2212 - Progressing by Arletta Bale, RN Skin Integrity: Wound healing without signs and symptoms of infection 02/12/2018 2212 - Progressing by Arletta Bale, RN Risk for impaired skin integrity will decrease 02/12/2018 2212 - Progressing by  Arletta Bale, RN Urinary Elimination: Ability to achieve and maintain adequate renal perfusion and functioning will improve 02/12/2018 2212 - Progressing by Arletta Bale, RN   Not Progressing Clinical Measurements: Ability to maintain clinical measurements within normal limits will improve 02/12/2018 2212 - Not Progressing by Arletta Bale, RN Diagnostic test results will improve 02/12/2018 2212 - Not Progressing by Arletta Bale, RN Respiratory complications will improve 02/12/2018 2212 - Not Progressing by Arletta Bale, RN Activity: Risk for activity intolerance will decrease 02/12/2018 2212 - Not Progressing by Arletta Bale, RN Activity: Risk for activity intolerance will decrease 02/12/2018 2212 - Not Progressing by Arletta Bale, RN Respiratory: Respiratory status will improve 02/12/2018 2212 - Not Progressing by Arletta Bale, RN

## 2018-02-12 NOTE — Progress Notes (Signed)
  Speech Language Pathology Treatment: Dysphagia  Patient Details Name: Brittany [name] MRN: [id number] DOB: 29-May-1961 Today's Date: 02/12/2018 Time: 5790-3833 SLP Time Calculation (min) (ACUTE ONLY): 12 min  Assessment / Plan / Recommendation Clinical Impression  Pt eager to come off nectar thick stating "it makes my stomach hurt." She continues to cough with trials thin water intermittently causing concern for decreased airway protection. Prior to po's pt with dyspnea and sats in upper 80's and increased work of breathing throughout the session. Educated pt to continue nectar thick liquids and will initiate ice chips after oral care and perform MBS or FEES tomorrow to fully assess and upgrade it able.    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 She was admitted with NSTEMI and shock, s/p emergent CABG on 02/03/18. Intubated 02/03/18 to 02/08/18.       SLP Plan  Other (Comment)(FEES or MBS)       Recommendations  Diet recommendations: Nectar-thick liquid(clear liquid diet thickened to nectar) Liquids provided via: Cup Medication Administration: Whole meds with puree Supervision: Patient able to self feed;Intermittent supervision to cue for compensatory strategies Compensations: Slow rate;Small sips/bites Postural Changes and/or Swallow Maneuvers: Seated upright 90 degrees                Oral Care Recommendations: Oral care BID Follow up Recommendations: (TBD) SLP Visit Diagnosis: Dysphagia, unspecified (R13.10) Plan: Other (Comment)(FEES or MBS)                      [name] 02/12/2018, 1:51 PM  Orbie Pyo Damesha Lawler M.Ed [employer] [phone]

## 2018-02-12 NOTE — Progress Notes (Addendum)
Advanced Heart Failure Rounding Note  PCP-Cardiologist: No primary care provider on file.   Subjective:    Events S/p emergent CABG 02/03/18 IABP removed 02/04/18 Impella out 02/08/18 Extubated 02/08/18  Yesterday milrinone  And IV lasix restarted. CVP 6.    OOB this morning for about 1 hour. Developed increased shortness of breath. Placed back on Bipap.  Feeling better on Bipap. CXR worse today.    Complaining of pain around foley.    ECHO 3/4 EF 25-30%. Aortic Valve. Severe Regurgitation. Grade II DD  TEE 02/03/18 LVEF 20%, Mild to Mod AR, Mild/Mod MR, RV normal.   Objective:   Weight Range: 113 lb 8 oz (51.5 kg) Body mass index is 21.45 kg/m.   Vital Signs:   Temp:  [97.1 F (36.2 C)-99.3 F (37.4 C)] 97.1 F (36.2 C) (03/05 0757) Pulse Rate:  [59-130] 120 (03/05 0800) Resp:  [18-40] 31 (03/05 0800) BP: (101-145)/(57-100) 109/65 (03/05 0800) SpO2:  [73 %-100 %] 94 % (03/05 0802) FiO2 (%):  [40 %-100 %] 50 % (03/05 0802) Weight:  [113 lb 8 oz (51.5 kg)] 113 lb 8 oz (51.5 kg) (03/05 0500) Last BM Date: 02/11/18  Weight change: Filed Weights   02/10/18 0500 02/11/18 0300 02/12/18 0500  Weight: 111 lb 8.8 oz (50.6 kg) 108 lb 0.4 oz (49 kg) 113 lb 8 oz (51.5 kg)    Intake/Output:   Intake/Output Summary (Last 24 hours) at 02/12/2018 0828 Last data filed at 02/12/2018 0800 Gross per 24 hour  Intake 1746.64 ml  Output 1455 ml  Net 291.64 ml    Physical Exam  CVP 7 General:  On Bipap. No resp difficulty HEENT: normal Neck: supple. Hard to assess with bipap. Carotids 2+ bilat; no bruits. No lymphadenopathy or thryomegaly appreciated. Cor: PMI nondisplaced. Regular rate & rhythm. No rubs, gallops or murmurs. Lungs: Rhonchi throughout on bipap.  Abdomen: soft, nontender, nondistended. No hepatosplenomegaly. No bruits or masses. Good bowel sounds. Extremities: no cyanosis, clubbing, rash, edema. LUE PICC  Neuro: alert & orientedx3, cranial nerves grossly intact.  moves all 4 extremities w/o difficulty. Affect pleasant GU: foley    Telemetry  Sinus Tach 110-120s. Personally checked   EKG    No new tracings.    Labs    CBC Recent Labs    02/11/18 0301 02/12/18 0300  WBC 17.2* 17.2*  HGB 10.4* 8.6*  HCT 30.9* 25.4*  MCV 92.2 91.7  PLT 301 672   Basic Metabolic Panel Recent Labs    02/09/18 1735  02/11/18 0301 02/12/18 0300  NA 136   < > 139 139  K 3.2*   < > 3.5 3.5  CL 93*   < > 97* 98*  CO2  --    < > 30 30  GLUCOSE 129*   < > 159* 153*  BUN 9   < > 18 23*  CREATININE 0.60   < > 0.66 0.71  CALCIUM  --    < > 8.7* 8.5*  MG 1.8  --   --   --    < > = values in this interval not displayed.   Liver Function Tests Recent Labs    02/12/18 0300  AST 56*  ALT 33  ALKPHOS 122  BILITOT 2.8*  PROT 5.3*  ALBUMIN 2.5*   No results for input(s): LIPASE, AMYLASE in the last 72 hours. Cardiac Enzymes No results for input(s): CKTOTAL, CKMB, CKMBINDEX, TROPONINI in the last 72 hours.  BNP: BNP (  last 3 results) Recent Labs    02/03/18 0853  BNP 3,127.4*    ProBNP (last 3 results) No results for input(s): PROBNP in the last 8760 hours.   D-Dimer No results for input(s): DDIMER in the last 72 hours. Hemoglobin A1C No results for input(s): HGBA1C in the last 72 hours. Fasting Lipid Panel No results for input(s): CHOL, HDL, LDLCALC, TRIG, CHOLHDL, LDLDIRECT in the last 72 hours. Thyroid Function Tests No results for input(s): TSH, T4TOTAL, T3FREE, THYROIDAB in the last 72 hours.  Invalid input(s): FREET3  Other results:   Imaging    Dg Chest Port 1 View  Result Date: 02/12/2018 CLINICAL DATA:  Congestive heart failure EXAM: PORTABLE CHEST 1 VIEW COMPARISON:  02/11/2018 FINDINGS: Prior CABG. Cardiomegaly. Mild bilateral airspace opacities, increasing slightly since prior study compatible with worsening edema/CHF. No visible significant effusions or acute bony abnormality. IMPRESSION: Mild worsening CHF.  Electronically Signed   By: Rolm Baptise M.D.   On: 02/12/2018 07:05     Medications:     Scheduled Medications: . acetaminophen  1,000 mg Oral Q6H  . amiodarone  200 mg Oral BID  . aspirin EC  81 mg Oral Daily  . atorvastatin  80 mg Oral q1800  . bisacodyl  10 mg Oral Daily   Or  . bisacodyl  10 mg Rectal Daily  . Chlorhexidine Gluconate Cloth  6 each Topical Daily  . Chlorhexidine Gluconate Cloth  6 each Topical Daily  . clopidogrel  75 mg Oral Daily  . digoxin  0.125 mg Oral Daily  . docusate sodium  200 mg Oral Daily  . enoxaparin (LOVENOX) injection  30 mg Subcutaneous Q24H  . feeding supplement (PRO-STAT SUGAR FREE 64)  30 mL Oral TID WC & HS  . furosemide  40 mg Intravenous BID  . levalbuterol  0.63 mg Nebulization Q6H  . mouth rinse  15 mL Mouth Rinse BID  . pantoprazole  40 mg Oral Daily  . sodium chloride flush  10-40 mL Intracatheter Q12H  . spironolactone  12.5 mg Oral Daily    Infusions: . sodium chloride 20 mL/hr at 02/11/18 2000  . cefTAZidime (FORTAZ)  IV Stopped (02/12/18 0531)  . milrinone 0.25 mcg/kg/min (02/12/18 0602)  . potassium chloride 10 mEq (02/12/18 0758)  . vancomycin 750 mg (02/12/18 0757)    PRN Medications: metoprolol tartrate, morphine injection, ondansetron (ZOFRAN) IV, oxyCODONE, RESOURCE THICKENUP CLEAR, sodium chloride flush, traMADol  Patient Profile   Brittany [name] is a 57 y/o woman with a history of PAF, preveious smoker quit 4 years ago, hypertension, 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 She was admitted with NSTEMI and shock.  Emergent cath 02/03/18 showed LAD 100% stenosed, LCx 95% stenosed. Taken for emergent CABG 02/03/18  Assessment/Plan   1. NSTEMI/STEMI s/p Emergent CABG 02/03/18 - New LBBB on ECG - cath with severe 2v CAD as above with likely severe LV dysfunction/ICM - s/p Emergent CABG 02/03/18. On aspirin,  plavix and atorvastatin.  - No s/s ischemia  2. Acute systolic HF -> cardiogenic shock in setting of #1 - TEE 02/03/18 LVEF 20%, Mild to Mod AR, Mild/Mod MR, RV normal.  - Impella out 3/1 -CVP down 5-6 CXR worsening HF. Increase lasix to 60 mg twice a day.  -CO-OX 52% on milrinone 0.25 mcg.  Repeat CO -OX now.  - Continue Entresto  - No bb. Continue digoxin 0.125 mg daily.  - Discussed  at [place] 02/04/18 for potential of durable VAD placement.   3. Acute respiratory failure d/t pulmonary edema - Extubated 3/1.  - WBC unchanged. .  - Continue vanc and fortaz. .  - Hypoxic this morning placed back on Bipap.  4. Tachycardia -In Sinus Tach.  -- No BB yet with cardiogenic shock.  - Watch closely for developins sepsis - Goal K > 4.0 and Mg > 2.0.  5. PAF - Previously not on AC. - CHA2DS2/VASc is at least 4. (CHF, Vasc disease, HTN, Female)  Remains in Sinus Tach.  - On lovenox for DVT.  6. H/o SCLC - s/p treatment 2015. Lost to f/u since 04/2015. Will need f/u with oncology (Dr. Julien Nordmann) as outpatient. - No change.   7. Dysuria Continue foley for now.  Check UA/urine culture  Medication concerns reviewed with patient and pharmacy team. Barriers identified: None at this time.   Length of Stay: [place], NP  02/12/2018, 8:28 AM  Advanced Heart Failure Team Pager [phone] (M-F; 7a - 4p)  Please contact [facility] for night-coverage after hours (4p -7a ) and weekends on [email or web address]  Agree.  Milrinone added back yesterday for recurrent cardiogenic shock and increased WOB. Also started on broad spectrum abx for possible PNA and rising WBCs. Echo done and shows EF 25%. RV ok. Visually moderate AI but by P1/2t it is severe. Co-ox up to 60% HR remains 110-120.   On exam  Lying in bed. Weak appearing.  Cor Tachy regular Lungs coarse Ab soft NT/ND Ext warm no edema.   She remains very tenuous. Now improved on milrinone but quite tachycardic. Co-ox improved. WBC  stabilizing on broad spectrum abx. On echo AI is at least moderate and may be severe. Remains to be seen how well she will tolerate this. Continue supportive care.   CRITICAL CARE Performed by: Glori Bickers  Total critical care time: 35 minutes  Critical care time was exclusive of separately billable procedures and treating other patients.  Critical care was necessary to treat or prevent imminent or life-threatening deterioration.  Critical care was time spent personally by me (independent of midlevel providers or residents) on the following activities: development of treatment plan with patient and/or surrogate as well as nursing, discussions with consultants, evaluation of patient's response to treatment, examination of patient, obtaining history from patient or surrogate, ordering and performing treatments and interventions, ordering and review of laboratory studies, ordering and review of radiographic studies, pulse oximetry and re-evaluation of patient's condition.  Glori Bickers, MD  12:21 PM

## 2018-02-12 NOTE — Progress Notes (Signed)
Bartle MD aware of patient lack of UOP and coox this morning.  No new orders given at this time.

## 2018-02-12 NOTE — Progress Notes (Signed)
Patient ID: Brittany [name], female   DOB: 28-Nov-1961, 57 y.o.   MRN: [id number] EVENING ROUNDS NOTE :     [address]       [place] [address]             [phone]                 4 Days Post-Op Procedure(s) (LRB): REMOVAL OF IMPELLA LEFT VENTRICULAR ASSIST DEVICE (N/A) TRANSESOPHAGEAL ECHOCARDIOGRAM (TEE) (N/A)  Total Length of Stay:  LOS: 9 days  BP (!) 128/100   Pulse (!) 120   Temp 99.4 F (37.4 C) (Oral)   Resp (!) 26   Ht 5\' 1"  (1.549 m)   Wt 113 lb 8 oz (51.5 kg)   SpO2 96%   BMI 21.45 kg/m   .Intake/Output      03/05 0701 - 03/06 0700   P.O. 150   I.V. (mL/kg) 284.4 (5.5)   IV Piggyback 550   Total Intake(mL/kg) 984.4 (19.1)   Urine (mL/kg/hr) 1370 (2.1)   Stool    Total Output 1370   Net -385.6         . sodium chloride 20 mL/hr at 02/11/18 2000  . cefTAZidime (FORTAZ)  IV Stopped (02/12/18 1344)  . milrinone 0.25 mcg/kg/min (02/12/18 1817)  . vancomycin Stopped (02/12/18 0857)     Lab Results  Component Value Date   WBC 17.2 (H) 02/12/2018   HGB 8.6 (L) 02/12/2018   HCT 25.4 (L) 02/12/2018   PLT 324 02/12/2018   GLUCOSE 153 (H) 02/12/2018   CHOL 279 (H) 01/01/2014   TRIG 109 01/01/2014   HDL 49 01/01/2014   LDLCALC 208 (H) 01/01/2014   ALT 33 02/12/2018   AST 56 (H) 02/12/2018   NA 139 02/12/2018   K 3.5 02/12/2018   CL 98 (L) 02/12/2018   CREATININE 0.71 02/12/2018   BUN 23 (H) 02/12/2018   CO2 30 02/12/2018   TSH 0.624 03/11/2014   INR 1.28 02/08/2018   Stable day Up to chair today    Grace Isaac MD  Beeper [phone] Office [phone] 02/12/2018 7:53 PM

## 2018-02-13 ENCOUNTER — Inpatient Hospital Stay (HOSPITAL_COMMUNITY): Payer: BLUE CROSS/BLUE SHIELD

## 2018-02-13 DIAGNOSIS — J9611 Chronic respiratory failure with hypoxia: Secondary | ICD-10-CM

## 2018-02-13 LAB — CBC
HEMATOCRIT: 23.6 % — AB (ref 36.0–46.0)
HEMOGLOBIN: 7.8 g/dL — AB (ref 12.0–15.0)
MCH: 30.6 pg (ref 26.0–34.0)
MCHC: 33.1 g/dL (ref 30.0–36.0)
MCV: 92.5 fL (ref 78.0–100.0)
Platelets: 338 10*3/uL (ref 150–400)
RBC: 2.55 MIL/uL — AB (ref 3.87–5.11)
RDW: 16.9 % — ABNORMAL HIGH (ref 11.5–15.5)
WBC: 15.9 10*3/uL — AB (ref 4.0–10.5)

## 2018-02-13 LAB — BASIC METABOLIC PANEL
ANION GAP: 9 (ref 5–15)
BUN: 23 mg/dL — ABNORMAL HIGH (ref 6–20)
CO2: 31 mmol/L (ref 22–32)
Calcium: 8.6 mg/dL — ABNORMAL LOW (ref 8.9–10.3)
Chloride: 98 mmol/L — ABNORMAL LOW (ref 101–111)
Creatinine, Ser: 0.67 mg/dL (ref 0.44–1.00)
GFR calc Af Amer: >60 mL/min (ref >60)
Glucose, Bld: 128 mg/dL — ABNORMAL HIGH (ref 65–99)
POTASSIUM: 3.6 mmol/L (ref 3.5–5.1)
Sodium: 138 mmol/L (ref 135–145)

## 2018-02-13 LAB — COOXEMETRY PANEL
Carboxyhemoglobin: 1.9 % — ABNORMAL HIGH (ref 0.5–1.5)
Methemoglobin: 1 % (ref 0.0–1.5)
O2 Saturation: 60.6 %
Total hemoglobin: 10 g/dL — ABNORMAL LOW (ref 12.0–16.0)

## 2018-02-13 LAB — VANCOMYCIN, TROUGH: Vancomycin Tr: 12 ug/mL — ABNORMAL LOW (ref 15–20)

## 2018-02-13 LAB — PREPARE RBC (CROSSMATCH)

## 2018-02-13 MED ORDER — FUROSEMIDE 10 MG/ML IJ SOLN
80.0000 mg | Freq: Two times a day (BID) | INTRAMUSCULAR | Status: DC
  Administered 2018-02-13 – 2018-02-16 (×8): 80 mg via INTRAVENOUS
  Filled 2018-02-13 (×8): qty 8

## 2018-02-13 MED ORDER — ALPRAZOLAM 0.25 MG PO TABS
0.2500 mg | ORAL_TABLET | Freq: Two times a day (BID) | ORAL | Status: DC | PRN
  Administered 2018-02-13 – 2018-02-26 (×9): 0.25 mg via ORAL
  Filled 2018-02-13 (×9): qty 1

## 2018-02-13 MED ORDER — POTASSIUM CHLORIDE CRYS ER 20 MEQ PO TBCR
40.0000 meq | EXTENDED_RELEASE_TABLET | Freq: Once | ORAL | Status: AC
  Administered 2018-02-13: 40 meq via ORAL
  Filled 2018-02-13: qty 2

## 2018-02-13 MED ORDER — ENSURE ENLIVE PO LIQD
237.0000 mL | Freq: Three times a day (TID) | ORAL | Status: DC
  Administered 2018-02-14 – 2018-02-27 (×25): 237 mL via ORAL
  Filled 2018-02-13: qty 237

## 2018-02-13 MED ORDER — SPIRONOLACTONE 25 MG PO TABS
25.0000 mg | ORAL_TABLET | Freq: Every day | ORAL | Status: DC
  Administered 2018-02-13 – 2018-02-24 (×12): 25 mg via ORAL
  Filled 2018-02-13 (×12): qty 1

## 2018-02-13 MED ORDER — VANCOMYCIN HCL IN DEXTROSE 1-5 GM/200ML-% IV SOLN
1000.0000 mg | Freq: Two times a day (BID) | INTRAVENOUS | Status: AC
  Administered 2018-02-13 – 2018-02-17 (×10): 1000 mg via INTRAVENOUS
  Filled 2018-02-13 (×10): qty 200

## 2018-02-13 MED ORDER — SODIUM CHLORIDE 0.9 % IV SOLN
Freq: Once | INTRAVENOUS | Status: AC
  Administered 2018-02-13: 12:00:00 via INTRAVENOUS

## 2018-02-13 MED ORDER — MAGNESIUM SULFATE 2 GM/50ML IV SOLN
2.0000 g | Freq: Once | INTRAVENOUS | Status: AC
  Administered 2018-02-13: 2 g via INTRAVENOUS
  Filled 2018-02-13: qty 50

## 2018-02-13 NOTE — Progress Notes (Signed)
SLP Cancellation Note  Patient Details Name: Brittany [name] MRN: [id number] DOB: [birth year]   Cancelled treatment:         Received call from xray saying pt could not come for MBS, on Bipap due to respiratory issues. Will check on next date for ability to participate in [place].    [name] 02/13/2018, 1:51 PM [name].Ed [employer] [phone]

## 2018-02-13 NOTE — Progress Notes (Signed)
Nutrition Follow-up  DOCUMENTATION CODES:   Severe malnutrition in context of acute illness/injury  INTERVENTION:   -Continue Boost Breeze po TID, each supplement provides 250 kcal and 9 grams of protein  -Pro-Stat QID  -Ensure Enlive po BID, each supplement provides 350 kcal and 20 grams of protein  -Liquid supplements to be thickened by nursing to Nectar consistency  -Await results from MBS, further recommendations to follow   NUTRITION DIAGNOSIS:   Severe Malnutrition related to acute illness(emergent CABG, heart failure, respiratory failure requiring vent/BiPap) as evidenced by energy intake < or equal to 50% for > or equal to 5 days, moderate muscle depletion, mild fat depletion.  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 Pt required emergent CABG 02/03/2018 with Impella placement. Pt remains intubated post-operatively.   2/24 Emergent Cardiac Cath showed LAD 100% stenosed, LCx 95% stenosed, take for emergent CABG 2/25 IABP removed 3/1 Impella removed, Extubated  Pt has been on and off Bipap, pt gets dyspneic with minimal exertion MBS scheduled for today; unable to complete due to pt back on BiPap. Per SLP, plan to attempt MBS tomorrow AM. Pt appears very weak on visit, fatigued.  NPO/CL, Nectar Thick day 10. Pt briefly received TF on 3/1 but was held same day for residual >250 mL Pt taking some of the Boost Breeze, Pro-Stat supplements per [facility]. Spoke with Presenter, broadcasting; indicates pt tolerated Chocolate Ensure Enlive this AM (thickened to nectar consistency) and pt likes. Pt indicates she had this yesterday as well.   Pt meets clinical characteristics for  severe malnutrition in context of acute illness per ASPEN guidelines using the clinical characteristics for food and nutrient intake (</=50% for >/= 5 days) and moderate muscle wasting.   Labs: reviewed Meds: lasix, KCl  NUTRITION - FOCUSED PHYSICAL EXAM:    Most Recent Value  Orbital Region  Mild depletion  Upper Arm Region  Mild depletion  Thoracic and Lumbar Region  No depletion  Buccal Region  Unable to assess  Temple Region  Mild depletion  Clavicle Bone Region  Mild depletion  Clavicle and Acromion Bone Region  No depletion  Scapular Bone Region  No depletion  Dorsal Hand  No depletion  Patellar Region  Moderate depletion  Anterior Thigh Region  Moderate depletion  Posterior Calf Region  Moderate depletion  Edema (RD Assessment)  Mild  Hair  Reviewed  Eyes  Reviewed  Mouth  Unable to assess  Nails  Reviewed       Diet Order:  Diet clear liquid Room service appropriate? Yes; Fluid consistency: Nectar Thick  EDUCATION NEEDS:   Not appropriate for education at this time  Skin:  Skin Assessment: Skin Integrity Issues: Skin Integrity Issues:: Incisions Incisions: chest and R leg  Last BM:  3/5  Height:   Ht Readings from Last 1 Encounters:  02/06/18 _0  (1.549 m)    Weight:   Wt Readings from Last 1 Encounters:  02/13/18 111 lb 15.9 oz (50.8 kg)    Ideal Body Weight:  47.7 kg  BMI:  Body mass index is 21.16 kg/m.  Estimated Nutritional Needs:   Kcal:  1500-1700 kcals  Protein:  75-85  Fluid:  >/= 1.5 L/d  Kerman Passey MS, RD, LDN, CNSC [phone] Pager  [phone] Weekend/On-Call Pager

## 2018-02-13 NOTE — Progress Notes (Signed)
CT surgery p.m. Rounds  Patient in bed resting-was up in chair today Slowly getting stronger Sinus tachycardia 115 stable blood pressure, O2 sat 93% nasal cannula

## 2018-02-13 NOTE — Anesthesia Postprocedure Evaluation (Signed)
Anesthesia Post Note  Patient: Brittany [name]  Procedure(s) Performed: REMOVAL OF IMPELLA LEFT VENTRICULAR ASSIST DEVICE (N/A ) TRANSESOPHAGEAL ECHOCARDIOGRAM (TEE) (N/A )     Patient location during evaluation: ICU Anesthesia Type: General Level of consciousness: sedated Pain management: pain level controlled Vital Signs Assessment: post-procedure vital signs reviewed and stable Respiratory status: patient remains intubated per anesthesia plan Cardiovascular status: stable Postop Assessment: no apparent nausea or vomiting Anesthetic complications: no    Last Vitals:  Vitals:   02/13/18 0700 02/13/18 0753  BP: 121/82   Pulse: (!) 111   Resp: (!) 21   Temp:  37.1 C  SpO2: 98%     Last Pain:  Vitals:   02/13/18 0753  TempSrc: Oral  PainSc:                  Atkins S

## 2018-02-13 NOTE — Progress Notes (Signed)
Patient has had several urine occurrences unmeasured due to voiding in chair or bed.  Rowe Pavy, RN

## 2018-02-13 NOTE — Care Management Note (Signed)
  Case Management Note  Patient Details  Name: Brittany [name] MRN: [id number] Date of Birth: August 26, 1961  Subjective/Objective: Pt is now s/p CABG and s/p removal of both Balloon pump and Impella                   Action/Plan:   PTA independent from home - pt's adult son stays in the home with her.  Pt has PCP and denied barriers to obtaining/medications.  Pt plans on staying with family in [place] for approximately 3 weeks at discharge.  Pt still requiring BIPAP but will need PT eval once stable.   Expected Discharge Date:                  Expected Discharge Plan:     In-House Referral:     Discharge planning Services  CM Consult  Post Acute Care Choice:    Choice offered to:     DME Arranged:    DME Agency:     HH Arranged:    HH Agency:     Status of Service:     If discussed at [employer], dates discussed:    Additional Comments:  [name], RN 02/13/2018, 3:09 PM

## 2018-02-13 NOTE — Progress Notes (Signed)
      [address]       [place], [address]             [phone]        CARDIOTHORACIC SURGERY PROGRESS NOTE   R5 Days Post-Op Procedure(s) (LRB): REMOVAL OF IMPELLA LEFT VENTRICULAR ASSIST DEVICE (N/A) TRANSESOPHAGEAL ECHOCARDIOGRAM (TEE) (N/A)  Subjective: Feels better.  Hungry.  Wants to eat.  Still gets dyspneic very quickly w/ any activity  Objective: Vital signs: BP Readings from Last 1 Encounters:  02/13/18 121/82   Pulse Readings from Last 1 Encounters:  02/13/18 (!) 111   Resp Readings from Last 1 Encounters:  02/13/18 (!) 21   Temp Readings from Last 1 Encounters:  02/13/18 98.8 F (37.1 C) (Oral)    Hemodynamics: CVP:  [9 mmHg-13 mmHg] 12 mmHg  Mixed venous co-ox 61%   Physical Exam:  Rhythm:   Sinus tach  Breath sounds: Coarse, no wheezing  Heart sounds:  tachy  Incisions:  Clean and dry  Abdomen:  Soft, non-distended, non-tender  Extremities:  Warm, adequately-perfused   Intake/Output from previous day: 03/05 0701 - 03/06 0700 In: 1745.1 [P.O.:300; I.V.:545.1; IV Piggyback:900] Out: 9295 [Urine:1670] Intake/Output this shift: No intake/output data recorded.  Lab Results:  CBC: Recent Labs    02/12/18 0300 02/13/18 0315  WBC 17.2* 15.9*  HGB 8.6* 7.8*  HCT 25.4* 23.6*  PLT 324 338    BMET:  Recent Labs    02/12/18 0300 02/13/18 0315  NA 139 138  K 3.5 3.6  CL 98* 98*  CO2 30 31  GLUCOSE 153* 128*  BUN 23* 23*  CREATININE 0.71 0.67  CALCIUM 8.5* 8.6*     PT/INR:  No results for input(s): LABPROT, INR in the last 72 hours.  CBG (last 3)  No results for input(s): GLUCAP in the last 72 hours.  ABG    Component Value Date/Time   PHART 7.433 02/09/2018 0415   PCO2ART 41.7 02/09/2018 0415   PO2ART 72.0 (L) 02/09/2018 0415   HCO3 28.0 02/09/2018 0415   TCO2 29 02/09/2018 1735   ACIDBASEDEF 1.0 02/08/2018 2117   O2SAT 60.6 02/13/2018 0310    CXR: n/a  Assessment/Plan: S/P Procedure(s)  (LRB): REMOVAL OF IMPELLA LEFT VENTRICULAR ASSIST DEVICE (N/A) TRANSESOPHAGEAL ECHOCARDIOGRAM (TEE) (N/A)  Clinically stable but remains very tenuous due to acute systolic CHF Maintaining NSR - sinus tach on milrinone 0.25, CVP 12 Marginal oxygenation w/ CXR demonstrating mild CHF and underlying chronic lung disease Leukocytosis w/out fever, trending down, on empiric ABx for possible HCAP Expected post op acute blood loss anemia, Hgb down further today 7.8 - likely due to frequent phlebotomy   Will transfuse 1 unit PRBCs for increased O2 carrying capacity  Otherwise Rx per advanced heart failure team  Rexene Alberts, MD 02/13/2018 8:42 AM

## 2018-02-13 NOTE — Progress Notes (Signed)
Pharmacy Antibiotic Note  Brittany [name] is a 57 y.o. female admitted on 02/03/2018 with SOB s/p urgent CABG with need for impella> removed 3/1.  3/4 increased WBC 17, increased SOB, ST 120s Cr stable 0.6 afebrile.   Pharmacy has been consulted for vancomycin and ceftazidime dosing.  Vancomycin trough this morning is just below goal at 12. Patient currently afebrile, wbc 16. Renal function normal.   Plan: Increase Vancomycin to 1000mg  iv q12h Ceftazidime 1gm IV q8h  Height: 5\' 1"  (154.9 cm) Weight: 111 lb 15.9 oz (50.8 kg) IBW/kg (Calculated) : 47.8  Temp (24hrs), Avg:98.5 F (36.9 C), Min:97.1 F (36.2 C), Max:99.4 F (37.4 C)  Recent Labs  Lab 02/09/18 1735 02/10/18 0402 02/11/18 0301 02/12/18 0300 02/13/18 0315 02/13/18 0529  WBC 12.7* 15.0* 17.2* 17.2* 15.9*  --   CREATININE 0.60 0.73 0.66 0.71 0.67  --   VANCOTROUGH  --   --   --   --   --  12*    Estimated Creatinine Clearance: 58.5 mL/min (by C-G formula based on SCr of 0.67 mg/dL).    Allergies  Allergen Reactions  . Iodinated Diagnostic Agents Hives and Rash  . Latex Hives and Other (See Comments)    Skin irritation  . Pineapple Hives  . Sulfa Antibiotics Hives and Rash  . Codeine Nausea And Vomiting    Antimicrobials this admission: 2/24 vanc x 1 3/4 > 2/24 zosyn x 1 2/24 Cefuroxime x 4 doses post op 3/1 ancef  Post op>3/4  3/4 ceftaz >  Dose adjustments this admission: 3/6 VT 12 - increase to 1g q12 hours  Microbiology results: 3/5 urine - sent  Erin Hearing PharmD., BCPS Clinical Pharmacist 02/13/2018 6:46 AM

## 2018-02-13 NOTE — Progress Notes (Signed)
  Speech Language Pathology  Patient Details Name: Brittany [name] MRN: [id number] DOB: 03/23/1961 Today's Date: 02/13/2018 Time:  -     MBS scheduled this afternoon at 1:30 or 2:00 dependent on xray's work flow.    Orbie Pyo [email or web address] CCC-SLP Pager [phone]                                           [name] 02/13/2018, 9:27 AM

## 2018-02-13 NOTE — Progress Notes (Addendum)
Advanced Heart Failure Rounding Note  PCP-Cardiologist: No primary care provider on file.   Subjective:    Events S/p emergent CABG 02/03/18 IABP removed 02/04/18 Impella out 02/08/18 Extubated 02/08/18  Yesterday lasix increased to 60 mg twice a day. Remains on milrinone 0.25 mcg. Todays Co-OX is 61%.   Still struggling with dyspnea and anxiety. HR remains fast.   ECHO 3/4 EF 25-30%. Aortic Valve. Severe Regurgitation. Grade II DD  TEE 02/03/18 LVEF 20%, Mild to Mod AR, Mild/Mod MR, RV normal.   Objective:   Weight Range: 111 lb 15.9 oz (50.8 kg) Body mass index is 21.16 kg/m.   Vital Signs:   Temp:  [97.1 F (36.2 C)-99.4 F (37.4 C)] 99 F (37.2 C) (03/06 0341) Pulse Rate:  [95-127] 111 (03/06 0700) Resp:  [19-31] 21 (03/06 0700) BP: (95-163)/(55-126) 121/82 (03/06 0700) SpO2:  [64 %-100 %] 98 % (03/06 0700) FiO2 (%):  [50 %-60 %] 50 % (03/06 0630) Weight:  [111 lb 15.9 oz (50.8 kg)] 111 lb 15.9 oz (50.8 kg) (03/06 0600) Last BM Date: 02/12/18  Weight change: Filed Weights   02/11/18 0300 02/12/18 0500 02/13/18 0600  Weight: 108 lb 0.4 oz (49 kg) 113 lb 8 oz (51.5 kg) 111 lb 15.9 oz (50.8 kg)    Intake/Output:   Intake/Output Summary (Last 24 hours) at 02/13/2018 0734 Last data filed at 02/13/2018 0600 Gross per 24 hour  Intake 1745.04 ml  Output 1670 ml  Net 75.04 ml    Physical Exam  CVP 12-13  General:  Appears chronically ill. No resp difficulty HEENT: normal Neck: supple. JVP 12--13. Carotids 2+ bilat; no bruits. No lymphadenopathy or thryomegaly appreciated. Cor: PMI nondisplaced. Tachy Regular rate & rhythm. No rubs, gallops or murmurs. Lungs: Basilar crackles   On high flow oxygen.  Abdomen: soft, nontender, nondistended. No hepatosplenomegaly. No bruits or masses. Good bowel sounds. Extremities: no cyanosis, clubbing, rash, edema. RUE PICC  Neuro: alert & orientedx3, cranial nerves grossly intact. moves all 4 extremities w/o difficulty. Affect  pleasant   Telemetry   Sinus Tach 110-120  Personally reviewed   EKG    No new tracings.    Labs    CBC Recent Labs    02/12/18 0300 02/13/18 0315  WBC 17.2* 15.9*  HGB 8.6* 7.8*  HCT 25.4* 23.6*  MCV 91.7 92.5  PLT 324 160   Basic Metabolic Panel Recent Labs    02/12/18 0300 02/13/18 0315  NA 139 138  K 3.5 3.6  CL 98* 98*  CO2 30 31  GLUCOSE 153* 128*  BUN 23* 23*  CREATININE 0.71 0.67  CALCIUM 8.5* 8.6*   Liver Function Tests Recent Labs    02/12/18 0300  AST 56*  ALT 33  ALKPHOS 122  BILITOT 2.8*  PROT 5.3*  ALBUMIN 2.5*   No results for input(s): LIPASE, AMYLASE in the last 72 hours. Cardiac Enzymes No results for input(s): CKTOTAL, CKMB, CKMBINDEX, TROPONINI in the last 72 hours.  BNP: BNP (last 3 results) Recent Labs    02/03/18 0853  BNP 3,127.4*    ProBNP (last 3 results) No results for input(s): PROBNP in the last 8760 hours.   D-Dimer No results for input(s): DDIMER in the last 72 hours. Hemoglobin A1C No results for input(s): HGBA1C in the last 72 hours. Fasting Lipid Panel No results for input(s): CHOL, HDL, LDLCALC, TRIG, CHOLHDL, LDLDIRECT in the last 72 hours. Thyroid Function Tests No results for input(s): TSH, T4TOTAL, T3FREE,  THYROIDAB in the last 72 hours.  Invalid input(s): FREET3  Other results:   Imaging    No results found.   Medications:     Scheduled Medications: . acetaminophen  1,000 mg Oral Q6H  . amiodarone  200 mg Oral BID  . aspirin EC  81 mg Oral Daily  . atorvastatin  80 mg Oral q1800  . bisacodyl  10 mg Oral Daily   Or  . bisacodyl  10 mg Rectal Daily  . Chlorhexidine Gluconate Cloth  6 each Topical Daily  . Chlorhexidine Gluconate Cloth  6 each Topical Daily  . clopidogrel  75 mg Oral Daily  . digoxin  0.125 mg Oral Daily  . docusate sodium  200 mg Oral Daily  . enoxaparin (LOVENOX) injection  40 mg Subcutaneous Q24H  . feeding supplement  1 Container Oral TID BM  . feeding  supplement (PRO-STAT SUGAR FREE 64)  30 mL Oral TID WC & HS  . furosemide  60 mg Intravenous BID  . levalbuterol  1.25 mg Nebulization Q6H  . mouth rinse  15 mL Mouth Rinse BID  . pantoprazole  40 mg Oral Daily  . sodium chloride flush  10-40 mL Intracatheter Q12H  . spironolactone  12.5 mg Oral Daily    Infusions: . sodium chloride 20 mL/hr at 02/13/18 0600  . cefTAZidime (FORTAZ)  IV Stopped (02/13/18 0558)  . milrinone 0.25 mcg/kg/min (02/13/18 0600)  . 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 She was admitted with NSTEMI and shock.  Emergent cath 02/03/18 showed LAD 100% stenosed, LCx 95% stenosed. Taken for emergent CABG 02/03/18  Assessment/Plan   1. NSTEMI/STEMI s/p Emergent CABG 02/03/18 - New LBBB on ECG - cath with severe 2v CAD as above with likely severe LV dysfunction/ICM - s/p Emergent CABG 02/03/18. On aspirin, plavix and atorvastatin.  -No s/s ischemia  2. Acute systolic HF -> cardiogenic shock in setting of #1 - TEE 02/03/18 LVEF 20%, Mild to Mod AR, Mild/Mod MR, RV normal.  - Impella out 3/1 - CO-OX 61% on milrinone 0.25 mcg. Volume status elevated. Increase lasix to 80 mg twice a day. Increase spiro to 25 mg daily.  - Add K 40 meq K daily.  - No bb. Continue digoxin 0.125 mg daily.  - Discussed at VAD MRB 02/04/18 for potential of durable VAD placement.   3. Acute respiratory failure d/t pulmonary edema - Extubated 3/1.  - WBC 17>15.9   - Continue vanc and fortaz. .  -   4. Tachycardia - Sinus Tach 100s  -- No BB yet with cardiogenic shock.  - Watch  closely for developins sepsis - Goal K > 4.0 and Mg > 2.0.  5. PAF - Previously not on AC. - CHA2DS2/VASc is at least 4. (CHF, Vasc disease, HTN, Female)  Remains in Sinus Tach.  - On lovenox for DVT.  6. H/o SCLC - s/p treatment 2015. Lost to f/u since 04/2015. Will need f/u with oncology (Dr. Julien Nordmann) as outpatient. - No change.   7. Dysuria Continue foley for now.  Check UA/urine culture pending.   Medication concerns reviewed with patient and pharmacy team.  Barriers identified: None at this time.   Length of Stay: [place], NP  02/13/2018, 7:34 AM  Advanced Heart Failure Team Pager [phone] (M-F; 7a - 4p)  Please contact [facility] for night-coverage after hours (4p -7a ) and weekends on [email or web address]  Agree.  She remains very tenuous. Respiratory status tenuous. Remains tachycardic. On BIPAP at times. Co-ox improved on milrinone.   On exam Mildly dyspneic Cor tachy regular Lungs basilar crackles Ab soft NT Ext no edema. Warm   She remains very tenuous. Co-ox improved on milrinone. Echo reviewed personally. AI looks moderate but may be severe. Continue broad spectrum abx, WBC improving. Has significant underlying lung disease which is likely contributing. Agree with transfusing 1u RBCs. Progressing slowly. Will need CT chest prior to d/c given h/o stage IV SCLC.  CRITICAL CARE Performed by: Glori Bickers  Total critical care time: 35 minutes  Critical care time was exclusive of separately billable procedures and treating other patients.  Critical care was necessary to treat or prevent imminent or life-threatening deterioration.  Critical care was time spent personally by me (independent of midlevel providers or residents) on the following activities: development of treatment plan with patient and/or surrogate as well as nursing, discussions with consultants, evaluation of patient's response to treatment, examination of patient, obtaining history from  patient or surrogate, ordering and performing treatments and interventions, ordering and review of laboratory studies, ordering and review of radiographic studies, pulse oximetry and re-evaluation of patient's condition.    Glori Bickers, MD  4:22 PM

## 2018-02-14 ENCOUNTER — Inpatient Hospital Stay (HOSPITAL_COMMUNITY): Payer: BLUE CROSS/BLUE SHIELD

## 2018-02-14 LAB — BASIC METABOLIC PANEL
ANION GAP: 10 (ref 5–15)
BUN: 19 mg/dL (ref 6–20)
CALCIUM: 8.7 mg/dL — AB (ref 8.9–10.3)
CO2: 32 mmol/L (ref 22–32)
Chloride: 95 mmol/L — ABNORMAL LOW (ref 101–111)
Creatinine, Ser: 0.72 mg/dL (ref 0.44–1.00)
GFR calc non Af Amer: >60 mL/min (ref >60)
GLUCOSE: 148 mg/dL — AB (ref 65–99)
POTASSIUM: 4.4 mmol/L (ref 3.5–5.1)
Sodium: 137 mmol/L (ref 135–145)

## 2018-02-14 LAB — TYPE AND SCREEN
ABO/RH(D): A POS
ANTIBODY SCREEN: NEGATIVE
Unit division: 0

## 2018-02-14 LAB — BPAM RBC
Blood Product Expiration Date: 201903132359
ISSUE DATE / TIME: 201903061143
UNIT TYPE AND RH: 600

## 2018-02-14 LAB — URINE CULTURE: Culture: 60000 — AB

## 2018-02-14 LAB — CBC
HEMATOCRIT: 31.1 % — AB (ref 36.0–46.0)
HEMOGLOBIN: 10.1 g/dL — AB (ref 12.0–15.0)
MCH: 29.8 pg (ref 26.0–34.0)
MCHC: 32.5 g/dL (ref 30.0–36.0)
MCV: 91.7 fL (ref 78.0–100.0)
Platelets: 400 10*3/uL (ref 150–400)
RBC: 3.39 MIL/uL — AB (ref 3.87–5.11)
RDW: 17.7 % — AB (ref 11.5–15.5)
WBC: 17.1 10*3/uL — AB (ref 4.0–10.5)

## 2018-02-14 LAB — MAGNESIUM: Magnesium: 2.1 mg/dL (ref 1.7–2.4)

## 2018-02-14 LAB — COOXEMETRY PANEL
Carboxyhemoglobin: 2.2 % — ABNORMAL HIGH (ref 0.5–1.5)
Methemoglobin: 1 % (ref 0.0–1.5)
O2 SAT: 56.3 %
Total hemoglobin: 10.3 g/dL — ABNORMAL LOW (ref 12.0–16.0)

## 2018-02-14 MED ORDER — IVABRADINE HCL 5 MG PO TABS
2.5000 mg | ORAL_TABLET | Freq: Two times a day (BID) | ORAL | Status: DC
  Administered 2018-02-14 – 2018-02-15 (×3): 2.5 mg via ORAL
  Filled 2018-02-14 (×3): qty 1

## 2018-02-14 MED ORDER — LEVALBUTEROL HCL 1.25 MG/0.5ML IN NEBU
1.2500 mg | INHALATION_SOLUTION | Freq: Three times a day (TID) | RESPIRATORY_TRACT | Status: DC
  Administered 2018-02-14 – 2018-02-16 (×6): 1.25 mg via RESPIRATORY_TRACT
  Filled 2018-02-14 (×6): qty 0.5

## 2018-02-14 NOTE — Progress Notes (Signed)
Modified Barium Swallow Progress Note  Patient Details  Name: Brittany [name] MRN: [id number] Date of Birth: 27-Dec-1960  Today's Date: 02/14/2018  Modified Barium Swallow completed.  Full report located under Chart Review in the Imaging Section.  Brief recommendations include the following:  Clinical Impression  Pt with non rebreather mask donned (on HFNC in room). She exhibits a respiratory based pharyngeal dysphagia due to mistiming of protective mechanisms and silent vocal cord penetration with instance of barium slighlty falling below cords ascencion to vocal cords during the swallow. Minimal intermittent vallecular residue due to decreased epiglottic deflection. Chin tuck not consistent in effectively narrowing laryngeal vestibule to prevent intrusion. Recommend continue nectar thick with upgrade to Dys 3 (from clear liq), rest breaks when needed, small bites/sips, intermittent throat clear and pill whole in applesauce/pudding. Continued ST for intervention. possible RMT (respiratory muscle strenght training if able from respiratory standpoint).       Swallow Evaluation Recommendations       SLP Diet Recommendations: Dysphagia 3 (Mech soft) solids;Nectar thick liquid   Liquid Administration via: Cup;No straw   Medication Administration: Whole meds with puree   Supervision: Patient able to self feed;Full supervision/cueing for compensatory strategies   Compensations: Small sips/bites;Slow rate;Clear throat intermittently(rest breaks if needed)   Postural Changes: Seated upright at 90 degrees   Oral Care Recommendations: Oral care BID        [name] 02/14/2018,10:10 AM  [name].Ed [employer] [phone]

## 2018-02-14 NOTE — Progress Notes (Signed)
TCTS BRIEF SICU PROGRESS NOTE  6 Days Post-Op  S/P Procedure(s) (LRB): REMOVAL OF IMPELLA LEFT VENTRICULAR ASSIST DEVICE (N/A) TRANSESOPHAGEAL ECHOCARDIOGRAM (TEE) (N/A)   Stable day Tolerating soft diet  Plan: Continue current plan  Rexene Alberts, MD 02/14/2018 7:30 PM

## 2018-02-14 NOTE — Progress Notes (Signed)
      [address]       [place] [address]             [phone]        CARDIOTHORACIC SURGERY PROGRESS NOTE   R6 Days Post-Op Procedure(s) (LRB): REMOVAL OF IMPELLA LEFT VENTRICULAR ASSIST DEVICE (N/A) TRANSESOPHAGEAL ECHOCARDIOGRAM (TEE) (N/A)  Subjective: Feels better.  Breathing comfortably this morning.  Hungry.  Voice is stronger.  Objective: Vital signs: BP Readings from Last 1 Encounters:  02/14/18 116/74   Pulse Readings from Last 1 Encounters:  02/14/18 (!) 106   Resp Readings from Last 1 Encounters:  02/14/18 19   Temp Readings from Last 1 Encounters:  02/14/18 98.8 F (37.1 C) (Axillary)    Hemodynamics: CVP:  [11 mmHg-12 mmHg] 12 mmHg  Mixed venous co-ox 56%  Physical Exam:  Rhythm:   Sinus tach  Breath sounds: clear  Heart sounds:  tachy  Incisions:  Clean and dry  Abdomen:  Soft, non-distended, non-tender  Extremities:  Warm, well-perfused    Intake/Output from previous day: 03/06 0701 - 03/07 0700 In: 1090.1 [I.V.:445.1; Blood:345; IV [id number]:876] Out: 8115 [BWIOM:3559] Intake/Output this shift: No intake/output data recorded.  Lab Results:  CBC: Recent Labs    02/12/18 0300 02/13/18 0315  WBC 17.2* 15.9*  HGB 8.6* 7.8*  HCT 25.4* 23.6*  PLT 324 338    BMET:  Recent Labs    02/13/18 0315 02/14/18 0551  NA 138 137  K 3.6 4.4  CL 98* 95*  CO2 31 32  GLUCOSE 128* 148*  BUN 23* 19  CREATININE 0.67 0.72  CALCIUM 8.6* 8.7*     PT/INR:  No results for input(s): LABPROT, INR in the last 72 hours.  CBG (last 3)  No results for input(s): GLUCAP in the last 72 hours.  ABG    Component Value Date/Time   PHART 7.433 02/09/2018 0415   PCO2ART 41.7 02/09/2018 0415   PO2ART 72.0 (L) 02/09/2018 0415   HCO3 28.0 02/09/2018 0415   TCO2 29 02/09/2018 1735   ACIDBASEDEF 1.0 02/08/2018 2117   O2SAT 56.3 02/14/2018 0500    CXR: n/a  Assessment/Plan: S/P Procedure(s) (LRB): REMOVAL OF IMPELLA LEFT  VENTRICULAR ASSIST DEVICE (N/A) TRANSESOPHAGEAL ECHOCARDIOGRAM (TEE) (N/A)  Clinically stable and slowly improving but remains very tenuous due to acute systolic CHF Maintaining NSR - sinus tach on milrinone 0.25, CVP 12 Marginal oxygenation w/ CXR demonstrating mild CHF and underlying chronic lung disease Leukocytosis w/out fever, trending down, on empiric ABx for possible HCAP Expected post op acute blood loss anemia,no CBC today   Will advance oral diet today - await f/u from SLP team but she should be able to tolerate dysphagia diet  Otherwise Rx per advanced heart failure team   Rexene Alberts, MD 02/14/2018 9:07 AM

## 2018-02-14 NOTE — Progress Notes (Signed)
Nutrition Follow-up  DOCUMENTATION CODES:   Severe malnutrition in context of acute illness/injury  INTERVENTION:   -Ensure Enlive po TID, each supplement provides 350 kcal and 20 grams of protein. Thickened to nectar consistency while on nectar thick diet  -Magic cup TID with meals, each supplement provides 290 kcal and 9 grams of protein  -Feeding assistance as needed   NUTRITION DIAGNOSIS:   Severe Malnutrition related to acute illness(emergent CABG, heart failure, respiratory failure requiring vent/BiPap) as evidenced by energy intake < or equal to 50% for > or equal to 5 days, moderate muscle depletion, mild fat depletion.  Continues but being addressed as diet advanced, supplements  GOAL:   Patient will meet greater than or equal to 90% of their needs  Progressing  MONITOR:   PO intake, Supplement acceptance, Diet advancement, Labs, Weight trends  REASON FOR ASSESSMENT:   Ventilator    ASSESSMENT:   Pt with PMH of small cell lung cancer s/p chemotherapy and radiation (2016), HTN, and CHF presents with cardiogenic shock in setting of STEMI. Pt required emergent CABG 02/03/2018 with Impella placement. Pt remains intubated post-operatively.   2/24 Emergent Cardiac Cath showed LAD 100% stenosed, LCx 95% stenosed, take for emergent CABG 2/25 IABP removed 3/1 Impella removed, Extubated 3/3 CL, Nectar Thick Diet 3/7 MBS performed, Dysphagia III/Nectar Thick Diet ordered  Pt has been on and off Bipap, pt gets dyspneic with minimal exertion  Pt on Bipap after MBS. Pt wanting to rest prior to lunch meal on Bipap. Per RN, pt does not like the Boost Breeze or Pro-Stat supplements. Pt likes Ensure Enlive but does not like to drink it thickened   Labs: reviewed Meds: lasix, KCl  Diet Order:  DIET DYS 3 Room service appropriate? Yes; Fluid consistency: Nectar Thick  EDUCATION NEEDS:   Not appropriate for education at this time  Skin:  Skin Assessment: Skin Integrity  Issues: Skin Integrity Issues:: Incisions Incisions: chest and R leg  Last BM:  3/5  Height:   Ht Readings from Last 1 Encounters:  02/06/18 5\' 1"  (1.549 m)    Weight:   Wt Readings from Last 1 Encounters:  02/14/18 109 lb 9.1 oz (49.7 kg)    Ideal Body Weight:  47.7 kg  BMI:  Body mass index is 20.7 kg/m.  Estimated Nutritional Needs:   Kcal:  1500-1700 kcals  Protein:  75-85  Fluid:  >/= 1.5 L/d  [place] Passey MS, RD, LDN, CNSC [phone] Pager  [phone] Weekend/On-Call Pager

## 2018-02-14 NOTE — Progress Notes (Addendum)
Advanced Heart Failure Rounding Note  PCP-Cardiologist: No primary care provider on file.   Subjective:    Events S/p emergent CABG 02/03/18 IABP removed 02/04/18 Impella out 02/08/18 Extubated 02/08/18  Yesterday spiro increased to 25 mg daily and received 1UPRBCs. On and off Bipap.   Remains SOB with minimal exertion.  ECHO 3/4 EF 25-30%. Aortic Valve. Severe Regurgitation. Grade II DD  TEE 02/03/18 LVEF 20%, Mild to Mod AR, Mild/Mod MR, RV normal.   Objective:   Weight Range: 109 lb 9.1 oz (49.7 kg) Body mass index is 20.7 kg/m.   Vital Signs:   Temp:  [98.8 F (37.1 C)-99.4 F (37.4 C)] 99 F (37.2 C) (03/07 0400) Pulse Rate:  [105-132] 106 (03/07 0700) Resp:  [19-41] 19 (03/07 0700) BP: (112-159)/(63-103) 116/74 (03/07 0700) SpO2:  [87 %-100 %] 95 % (03/07 0700) FiO2 (%):  [40 %] 40 % (03/07 0400) Weight:  [109 lb 9.1 oz (49.7 kg)] 109 lb 9.1 oz (49.7 kg) (03/07 0546) Last BM Date: 02/13/18  Weight change: Filed Weights   02/12/18 0500 02/13/18 0600 02/14/18 0546  Weight: 113 lb 8 oz (51.5 kg) 111 lb 15.9 oz (50.8 kg) 109 lb 9.1 oz (49.7 kg)    Intake/Output:   Intake/Output Summary (Last 24 hours) at 02/14/2018 0730 Last data filed at 02/14/2018 0600 Gross per 24 hour  Intake 1090.1 ml  Output 1155 ml  Net -64.9 ml    Physical Exam  CVP ~10  General:  On high flow oxygen via [place].  HEENT: normal Neck: supple. JVP 9-10. Carotids 2+ bilat; no bruits. No lymphadenopathy or thryomegaly appreciated. Cor: PMI nondisplaced. Tachy/Regular rate & rhythm. No rubs, gallops or murmurs. Sternal incision approximated.  Lungs: Coarse throughout. on high flow oxygen no wheeze Abdomen: soft, nontender, nondistended. No hepatosplenomegaly. No bruits or masses. Good bowel sounds. Extremities: no cyanosis, clubbing, rash, edema Neuro: alert & orientedx3, cranial nerves grossly intact. moves all 4 extremities w/o difficulty. Affect pleasant   Telemetry   Sinus Tach  110-120s Personally reviewed    EKG    No new tracings.    Labs    CBC Recent Labs    02/12/18 0300 02/13/18 0315  WBC 17.2* 15.9*  HGB 8.6* 7.8*  HCT 25.4* 23.6*  MCV 91.7 92.5  PLT 324 831   Basic Metabolic Panel Recent Labs    02/13/18 0315 02/14/18 0551  NA 138 137  K 3.6 4.4  CL 98* 95*  CO2 31 32  GLUCOSE 128* 148*  BUN 23* 19  CREATININE 0.67 0.72  CALCIUM 8.6* 8.7*  MG  --  2.1   Liver Function Tests Recent Labs    02/12/18 0300  AST 56*  ALT 33  ALKPHOS 122  BILITOT 2.8*  PROT 5.3*  ALBUMIN 2.5*   No results for input(s): LIPASE, AMYLASE in the last 72 hours. Cardiac Enzymes No results for input(s): CKTOTAL, CKMB, CKMBINDEX, TROPONINI in the last 72 hours.  BNP: BNP (last 3 results) Recent Labs    02/03/18 0853  BNP 3,127.4*    ProBNP (last 3 results) No results for input(s): PROBNP in the last 8760 hours.   D-Dimer No results for input(s): DDIMER in the last 72 hours. Hemoglobin A1C No results for input(s): HGBA1C in the last 72 hours. Fasting Lipid Panel No results for input(s): CHOL, HDL, LDLCALC, TRIG, CHOLHDL, LDLDIRECT in the last 72 hours. Thyroid Function Tests No results for input(s): TSH, T4TOTAL, T3FREE, THYROIDAB in the last  72 hours.  Invalid input(s): FREET3  Other results:   Imaging    No results found.   Medications:     Scheduled Medications: . amiodarone  200 mg Oral BID  . aspirin EC  81 mg Oral Daily  . atorvastatin  80 mg Oral q1800  . bisacodyl  10 mg Oral Daily   Or  . bisacodyl  10 mg Rectal Daily  . Chlorhexidine Gluconate Cloth  6 each Topical Daily  . Chlorhexidine Gluconate Cloth  6 each Topical Daily  . clopidogrel  75 mg Oral Daily  . digoxin  0.125 mg Oral Daily  . docusate sodium  200 mg Oral Daily  . enoxaparin (LOVENOX) injection  40 mg Subcutaneous Q24H  . feeding supplement  1 Container Oral TID BM  . feeding supplement (ENSURE ENLIVE)  237 mL Oral TID BM  . feeding  supplement (PRO-STAT SUGAR FREE 64)  30 mL Oral TID WC & HS  . furosemide  80 mg Intravenous BID  . levalbuterol  1.25 mg Nebulization Q6H  . mouth rinse  15 mL Mouth Rinse BID  . pantoprazole  40 mg Oral Daily  . sodium chloride flush  10-40 mL Intracatheter Q12H  . spironolactone  25 mg Oral Daily    Infusions: . sodium chloride Stopped (02/13/18 2128)  . cefTAZidime (FORTAZ)  IV 1 g (02/14/18 0546)  . milrinone 0.25 mcg/kg/min (02/13/18 1800)  . 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 She was admitted with NSTEMI and shock.  Emergent cath 02/03/18 showed LAD 100% stenosed, LCx 95% stenosed. Taken for emergent CABG 02/03/18  Assessment/Plan   1. NSTEMI/STEMI s/p Emergent CABG 02/03/18 - New LBBB on ECG - cath with severe 2v CAD as above with likely severe LV dysfunction/ICM - s/p Emergent CABG 02/03/18. On aspirin, plavix and atorvastatin.  - No s/s ischemia   2. Acute systolic HF -> cardiogenic shock in setting of #1 - TEE 02/03/18 LVEF 20%, Mild to Mod AR, Mild/Mod MR, RV normal.  - Impella out 3/1 - CO-OX 56% on milrinone 0.25 mcg.  - CVP 10. Continue lasix  80 mg twice a day.  - Continue spiro to 25 mg daily.  -Renal function stable.   - No bb. Continue digoxin 0.125 mg daily.  - Discussed at VAD MRB 02/04/18 for potential of durable VAD placement.   3. Acute respiratory failure d/t pulmonary edema - Extubated 3/1.  - WBC 17>15.9>CBC pending - Continue vanc and fortaz. .  -   4. Tachycardia - Sinus Tach 100s  -- No BB yet with  cardiogenic shock.  - Watch closely for sepsis - Goal K > 4.0 and Mg > 2.0.  5. PAF - Previously not on AC. - CHA2DS2/VASc is at least 4. (CHF, Vasc disease, HTN, Female)  Remains in Sinus Tach.  - On lovenox for DVT.  6. H/o SCLC - s/p treatment 2015. Lost to f/u since 04/2015. Will need f/u with oncology (Dr. Julien Nordmann) as outpatient. - No change.   7. Dysuria Continue foley for now.  Urine CX- --> preliminary-->pseudomonas aeruginosa. 60,000 Should be covered with fortaz.  8. Anemia Hgb 7.8> pending.  S/P 1UPRBCs 3.05/2017   Medication concerns reviewed with patient and pharmacy team. Barriers identified: None at this time.   Length of Stay: [place], NP  02/14/2018, 7:30 AM  Advanced Heart Failure Team Pager [phone] (M-F; [place])  Please contact [facility] for night-coverage after hours (4p -7a ) and weekends on [email or web address]  Patient seen and examined with Darrick Grinder, NP. We discussed all aspects of the encounter. I agree with the assessment and plan as stated above.   She is making slow progress. Still dyspneic with minimal exertion and tachycardic. Will add low-dose ivabradine. Continue abx for possible PNA. For swallow study today. Continue to mobilize. Will need chest CT prior to d/c. D/w Dr. Roxy Manns.   Glori Bickers, MD  9:28 AM

## 2018-02-15 ENCOUNTER — Inpatient Hospital Stay (HOSPITAL_COMMUNITY): Payer: BLUE CROSS/BLUE SHIELD

## 2018-02-15 LAB — BASIC METABOLIC PANEL
Anion gap: 10 (ref 5–15)
BUN: 17 mg/dL (ref 6–20)
CALCIUM: 8.4 mg/dL — AB (ref 8.9–10.3)
CO2: 34 mmol/L — AB (ref 22–32)
CREATININE: 0.57 mg/dL (ref 0.44–1.00)
Chloride: 93 mmol/L — ABNORMAL LOW (ref 101–111)
GFR calc Af Amer: >60 mL/min (ref >60)
GFR calc non Af Amer: >60 mL/min (ref >60)
Glucose, Bld: 193 mg/dL — ABNORMAL HIGH (ref 65–99)
Potassium: 2.9 mmol/L — ABNORMAL LOW (ref 3.5–5.1)
SODIUM: 137 mmol/L (ref 135–145)

## 2018-02-15 LAB — COOXEMETRY PANEL
CARBOXYHEMOGLOBIN: 1.7 % — AB (ref 0.5–1.5)
Methemoglobin: 1.5 % (ref 0.0–1.5)
O2 SAT: 57.7 %
TOTAL HEMOGLOBIN: 10.2 g/dL — AB (ref 12.0–16.0)

## 2018-02-15 LAB — CBC
HCT: 29.6 % — ABNORMAL LOW (ref 36.0–46.0)
Hemoglobin: 9.7 g/dL — ABNORMAL LOW (ref 12.0–15.0)
MCH: 30.3 pg (ref 26.0–34.0)
MCHC: 32.8 g/dL (ref 30.0–36.0)
MCV: 92.5 fL (ref 78.0–100.0)
PLATELETS: 395 10*3/uL (ref 150–400)
RBC: 3.2 MIL/uL — AB (ref 3.87–5.11)
RDW: 17 % — AB (ref 11.5–15.5)
WBC: 15.3 10*3/uL — ABNORMAL HIGH (ref 4.0–10.5)

## 2018-02-15 LAB — BRAIN NATRIURETIC PEPTIDE: B NATRIURETIC PEPTIDE 5: 2526.7 pg/mL — AB (ref 0.0–100.0)

## 2018-02-15 LAB — DIGOXIN LEVEL: Digoxin Level: <0.2 ng/mL — ABNORMAL LOW (ref 0.8–2.0)

## 2018-02-15 MED ORDER — BISACODYL 5 MG PO TBEC
10.0000 mg | DELAYED_RELEASE_TABLET | Freq: Every day | ORAL | Status: DC | PRN
  Administered 2018-02-26: 5 mg via ORAL
  Filled 2018-02-15: qty 2

## 2018-02-15 MED ORDER — POTASSIUM CHLORIDE 10 MEQ/50ML IV SOLN
10.0000 meq | INTRAVENOUS | Status: AC
  Administered 2018-02-15 (×6): 10 meq via INTRAVENOUS
  Filled 2018-02-15 (×5): qty 50

## 2018-02-15 MED ORDER — BISACODYL 10 MG RE SUPP
10.0000 mg | Freq: Every day | RECTAL | Status: DC | PRN
  Administered 2018-02-26: 10 mg via RECTAL
  Filled 2018-02-15: qty 1

## 2018-02-15 MED ORDER — IVABRADINE HCL 5 MG PO TABS
5.0000 mg | ORAL_TABLET | Freq: Two times a day (BID) | ORAL | Status: DC
  Administered 2018-02-15 – 2018-02-27 (×25): 5 mg via ORAL
  Filled 2018-02-15 (×26): qty 1

## 2018-02-15 MED ORDER — POTASSIUM CHLORIDE 10 MEQ/50ML IV SOLN
10.0000 meq | INTRAVENOUS | Status: DC
  Administered 2018-02-15: 10 meq via INTRAVENOUS
  Filled 2018-02-15: qty 50

## 2018-02-15 MED ORDER — DOCUSATE SODIUM 100 MG PO CAPS
200.0000 mg | ORAL_CAPSULE | Freq: Every day | ORAL | Status: DC | PRN
  Administered 2018-02-18: 200 mg via ORAL
  Filled 2018-02-15: qty 2

## 2018-02-15 NOTE — Progress Notes (Signed)
  Speech Language Pathology Treatment: Dysphagia  Patient Details Name: Brittany [name] MRN: [id number] DOB: Jun 19, 1961 Today's Date: 02/15/2018 Time: 4268-3419 SLP Time Calculation (min) (ACUTE ONLY): 24 min  Assessment / Plan / Recommendation Clinical Impression  Pt seen for dysphagia intervention initiating RMT (respiratory muscle strength training) for strengthening expiratory muscles to facilitate swallow/respiratory reciprocity. EMT (expiratory muscle strength training) using hand held trainer set at lower setting of 7 did not provide therapeutic resistance. SLP manipulated settings to achieve 17 cm of water pressure with perceived exertion of moderate intensity. Encouraged her to self practice 2-3 times a day, 3 sets of 10. Continue Dys 3, nectar thick liquids with good prognosis for return to thin liquids when appropriate.    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 She was admitted with NSTEMI and shock, s/p emergent CABG on 02/03/18. Intubated 02/03/18 to 02/08/18.       SLP Plan  Continue with current plan of care       Recommendations  Diet recommendations: Dysphagia 3 (mechanical soft);Nectar-thick liquid Liquids provided via: Cup Medication Administration: Whole meds with puree Supervision: Patient able to self feed;Intermittent supervision to cue for compensatory strategies Compensations: Small sips/bites;Slow rate;Clear throat intermittently Postural Changes and/or Swallow Maneuvers: Seated upright 90 degrees                Oral Care Recommendations: Oral care BID Follow up Recommendations: None SLP Visit Diagnosis: Dysphagia, pharyngeal phase (R13.13) Plan: Continue with current plan of care                       [name] 02/15/2018, 12:29 PM  [name].Ed [employer] [phone]

## 2018-02-15 NOTE — Care Management Note (Signed)
  Case Management Note  Patient Details  Name: Brittany [name] MRN: [id number] Date of Birth: [birth year]  Subjective/Objective: Pt is now s/p CABG and s/p removal of both Balloon pump and Impella                   Action/Plan:   PTA independent from home - pt's adult son stays in the home with her.  Pt has PCP and denied barriers to obtaining/medications.  Pt plans on staying with family in [place] for approximately 3 weeks at discharge.  Pt still requiring BIPAP but will need PT eval once stable.   Expected Discharge Date:                  Expected Discharge Plan:     In-House Referral:     Discharge planning Services  CM Consult  Post Acute Care Choice:    Choice offered to:     DME Arranged:    DME Agency:     HH Arranged:    HH Agency:     Status of Service:     If discussed at [employer] [facility] [employer], dates discussed:    Additional Comments: 02/15/2018 Pts resp status remains tenous - remains on BIPAP or HFNC.  Undergoing VAD work up Brittany Labrador, RN 02/15/2018, 3:02 PM

## 2018-02-15 NOTE — Progress Notes (Addendum)
Advanced Heart Failure Rounding Note  PCP-Cardiologist: No primary care provider on file.   Subjective:    Events S/p emergent CABG 02/03/18 IABP removed 02/04/18 Impella out 02/08/18 Extubated 02/08/18  Remains on milrinone 0.25 mcg. CO-OX 58%. Yesterday started on 2.5 mg corlanor twice a day.   On an off bipap. Complaining of fatigue. Breathing feels better but gets tired at times. HR down 95-100. Co-ox 58%  CXR today with diffuse worsening left sided airspace disease.   ECHO 3/4 EF 25-30%. Aortic Valve. Severe Regurgitation. Grade II DD  TEE 02/03/18 LVEF 20%, Mild to Mod AR, Mild/Mod MR, RV normal.   Objective:   Weight Range: 111 lb 1.8 oz (50.4 kg) Body mass index is 20.99 kg/m.   Vital Signs:   Temp:  [98.4 F (36.9 C)-99.8 F (37.7 C)] 98.4 F (36.9 C) (03/08 0500) Pulse Rate:  [88-125] 95 (03/08 0600) Resp:  [16-28] 23 (03/08 0600) BP: (101-150)/(60-96) 125/76 (03/08 0600) SpO2:  [85 %-100 %] 96 % (03/08 0600) FiO2 (%):  [40 %] 40 % (03/07 2330) Weight:  [111 lb 1.8 oz (50.4 kg)] 111 lb 1.8 oz (50.4 kg) (03/08 0600) Last BM Date: 02/14/18  Weight change: Filed Weights   02/13/18 0600 02/14/18 0546 02/15/18 0600  Weight: 111 lb 15.9 oz (50.8 kg) 109 lb 9.1 oz (49.7 kg) 111 lb 1.8 oz (50.4 kg)    Intake/Output:   Intake/Output Summary (Last 24 hours) at 02/15/2018 0741 Last data filed at 02/15/2018 0700 Gross per 24 hour  Intake 1188.8 ml  Output 500 ml  Net 688.8 ml    Physical Exam   CVP 7  General:  Lying in bed. On Bipap.  HEENT: normal Neck: supple. Jvp hard to assess with bipap. Carotids 2+ bilat; no bruits. No lymphadenopathy or thryomegaly appreciated. Cor: PMI nondisplaced. Regular rate & rhythm. No rubs, gallops or murmurs. Sternal wound healing well Lungs: coarse throughout but clearing Abdomen: soft, nontender, nondistended. No hepatosplenomegaly. No bruits or masses. Good bowel sounds. Extremities: no cyanosis, clubbing, rash,  edema Neuro: on bipap .     Telemetry   Sinus 95-100 Personally reviewed    EKG    No new tracings.    Labs    CBC Recent Labs    02/14/18 0551 02/15/18 0449  WBC 17.1* 15.3*  HGB 10.1* 9.7*  HCT 31.1* 29.6*  MCV 91.7 92.5  PLT 400 902   Basic Metabolic Panel Recent Labs    02/14/18 0551 02/15/18 0449  NA 137 137  K 4.4 2.9*  CL 95* 93*  CO2 32 34*  GLUCOSE 148* 193*  BUN 19 17  CREATININE 0.72 0.57  CALCIUM 8.7* 8.4*  MG 2.1  --    Liver Function Tests No results for input(s): AST, ALT, ALKPHOS, BILITOT, PROT, ALBUMIN in the last 72 hours. No results for input(s): LIPASE, AMYLASE in the last 72 hours. Cardiac Enzymes No results for input(s): CKTOTAL, CKMB, CKMBINDEX, TROPONINI in the last 72 hours.  BNP: BNP (last 3 results) Recent Labs    02/03/18 0853 02/15/18 0449  BNP 3,127.4* 2,526.7*    ProBNP (last 3 results) No results for input(s): PROBNP in the last 8760 hours.   D-Dimer No results for input(s): DDIMER in the last 72 hours. Hemoglobin A1C No results for input(s): HGBA1C in the last 72 hours. Fasting Lipid Panel No results for input(s): CHOL, HDL, LDLCALC, TRIG, CHOLHDL, LDLDIRECT in the last 72 hours. Thyroid Function Tests No results for  input(s): TSH, T4TOTAL, T3FREE, THYROIDAB in the last 72 hours.  Invalid input(s): FREET3  Other results:   Imaging    Dg Swallowing Func-speech Pathology  Result Date: 02/14/2018 Objective Swallowing Evaluation: Type of Study: MBS-Modified Barium Swallow Study  Patient Details Name: Seham Gardenhire MRN: [id number] Date of Birth: 1961/11/03 Today's Date: 02/14/2018 Time: SLP Start Time (ACUTE ONLY): 0915 -SLP Stop Time (ACUTE ONLY): 0932 SLP Time Calculation (min) (ACUTE ONLY): 17 min Past Medical History: Past Medical History: Diagnosis Date . Acute systolic congestive heart failure ([place]) 02/03/2018 . Allergy  . Anxiety  . Asthma  . Hypertension  . PAF (paroxysmal atrial fibrillation)  ([place])  . Prophylactic measure 08/03/14-08/19/14  Prophyl. cranial radiation 24 Gy . S/P emergency CABG x 3 02/03/2018  LIMA to LAD, SVG to D1, SVG to OM1, EVH via right thigh with implantation of Impella LD LVAD via direct aortic approach . Small cell lung cancer ([place]) 03/16/2014 Past Surgical History: Past Surgical History: Procedure Laterality Date . CESAREAN SECTION   . CORONARY ARTERY BYPASS GRAFT N/A 02/03/2018  Procedure: CORONARY ARTERY BYPASS GRAFTING (CABG);  Surgeon: Rexene Alberts, MD;  Location: [facility];  Service: Open Heart Surgery;  Laterality: N/A;  Time 3 using left internal mammary artery and endoscopically harvested right saphenous vein . CORONARY BALLOON ANGIOPLASTY N/A 02/03/2018  Procedure: CORONARY BALLOON ANGIOPLASTY;  Surgeon: Martinique, Peter M, MD;  Location: [facility];  Service: Cardiovascular;  Laterality: N/A; . CORONARY/GRAFT ACUTE MI REVASCULARIZATION N/A 02/03/2018  Procedure: Coronary/Graft Acute MI Revascularization;  Surgeon: Martinique, Peter M, MD;  Location: [place] [facility];  Service: Cardiovascular;  Laterality: N/A; . IABP INSERTION N/A 02/03/2018  Procedure: IABP Insertion;  Surgeon: Martinique, Peter M, MD;  Location: [facility];  Service: Cardiovascular;  Laterality: N/A; . INTRAOPERATIVE TRANSESOPHAGEAL ECHOCARDIOGRAM N/A 02/03/2018  Procedure: INTRAOPERATIVE TRANSESOPHAGEAL ECHOCARDIOGRAM;  Surgeon: Rexene Alberts, MD;  Location: [facility];  Service: Open Heart Surgery;  Laterality: N/A; . LEFT HEART CATH AND CORONARY ANGIOGRAPHY N/A 02/03/2018  Procedure: LEFT HEART CATH AND CORONARY ANGIOGRAPHY;  Surgeon: Martinique, Peter M, MD;  Location: [facility];  Service: Cardiovascular;  Laterality: N/A; . MEDIASTINOSCOPY N/A 03/11/2014  Procedure: MEDIASTINOSCOPY;  Surgeon: Melrose Nakayama, MD;  Location: [facility];  Service: Thoracic;  Laterality: N/A; . PLACEMENT OF [place] LEFT VENTRICULAR ASSIST DEVICE  02/03/2018  Procedure: PLACEMENT OF [place] LEFT VENTRICULAR ASSIST DEVICE  LD;  Surgeon: Rexene Alberts, MD;  Location: [facility];  Service: Open Heart Surgery;; . REMOVAL OF IMPELLA LEFT VENTRICULAR ASSIST DEVICE N/A 02/08/2018  Procedure: REMOVAL OF [place] LEFT VENTRICULAR ASSIST DEVICE;  Surgeon: Rexene Alberts, MD;  Location: [facility];  Service: Open Heart Surgery;  Laterality: N/A; . RIGHT HEART CATH N/A 02/03/2018  Procedure: RIGHT HEART CATH;  Surgeon: Martinique, Peter M, MD;  Location: [place];  Service: Cardiovascular;  Laterality: N/A; . TEE WITHOUT CARDIOVERSION N/A 02/08/2018  Procedure: TRANSESOPHAGEAL ECHOCARDIOGRAM (TEE);  Surgeon: Rexene Alberts, MD;  Location: [facility];  Service: Open Heart Surgery;  Laterality: N/A; . TUBAL LIGATION   . VIDEO BRONCHOSCOPY WITH ENDOBRONCHIAL ULTRASOUND N/A 03/11/2014  Procedure: VIDEO BRONCHOSCOPY WITH ENDOBRONCHIAL ULTRASOUND;  Surgeon: Melrose Nakayama, MD;  Location: [place];  Service: Thoracic;  Laterality: N/A; HPI: Lanie Schelling a 57 y/o woman with a history of PAF,preveious smoker quit 4 years ago, hypertension, 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  She was admitted with NSTEMI and shock, s/p emergent CABG on 02/03/18. Intubated 02/03/18 to 02/08/18.  Subjective: "I'm doing much better" Assessment / Plan / Recommendation CHL IP CLINICAL IMPRESSIONS 02/14/2018 Clinical Impression Pt with non rebreather mask donned (on HFNC in room). She exhibits a respiratory based pharyngeal dysphagia due to mistiming of protective mechanisms and silent vocal cord penetration with instance of barium slighlty falling below cords ascencion to vocal cords during the swallow. Minimal intermittent vallecular residue due to decreased epiglottic deflection. Chin tuck not consistent in effectively narrowing laryngeal vestibule to prevent intrusion. Recommend continue nectar thick with upgrade to Dys 3 (from clear liq), rest breaks when needed, small  bites/sips, intermittent throat clear and pill whole in applesauce/pudding. Continued ST for intervention. possible RMT (respiratory muscle strenght training if able from respiratory standpoint).     SLP Visit Diagnosis Dysphagia, pharyngeal phase (R13.13) Attention and concentration deficit following -- Frontal lobe and executive function deficit following -- Impact on safety and function Moderate aspiration risk   CHL IP TREATMENT RECOMMENDATION 02/14/2018 Treatment Recommendations Therapy as outlined in treatment plan below   Prognosis 02/14/2018 Prognosis for Safe Diet Advancement Good Barriers to Reach Goals Severity of deficits Barriers/Prognosis Comment -- CHL IP DIET RECOMMENDATION 02/14/2018 SLP Diet Recommendations Dysphagia 3 (Mech soft) solids;Nectar thick liquid Liquid Administration via Cup;No straw Medication Administration Whole meds with puree Compensations Small sips/bites;Slow rate;Clear throat intermittently Postural Changes Seated upright at 90 degrees   CHL IP OTHER RECOMMENDATIONS 02/14/2018 Recommended Consults -- Oral Care Recommendations Oral care BID Other Recommendations --   CHL IP FOLLOW UP RECOMMENDATIONS 02/14/2018 Follow up Recommendations None   CHL IP FREQUENCY AND DURATION 02/14/2018 Speech Therapy Frequency (ACUTE ONLY) min 2x/week Treatment Duration 2 weeks      CHL IP ORAL PHASE 02/14/2018 Oral Phase WFL Oral - Pudding Teaspoon -- Oral - Pudding Cup -- Oral - Honey Teaspoon -- Oral - Honey Cup -- Oral - Nectar Teaspoon -- Oral - Nectar Cup -- Oral - Nectar Straw -- Oral - Thin Teaspoon -- Oral - Thin Cup -- Oral - Thin Straw -- Oral - Puree -- Oral - Mech Soft -- Oral - Regular -- Oral - Multi-Consistency -- Oral - Pill -- Oral Phase - Comment --  CHL IP PHARYNGEAL PHASE 02/14/2018 Pharyngeal Phase Impaired Pharyngeal- Pudding Teaspoon -- Pharyngeal -- Pharyngeal- Pudding Cup -- Pharyngeal -- Pharyngeal- Honey Teaspoon -- Pharyngeal -- Pharyngeal- Honey Cup -- Pharyngeal -- Pharyngeal- Nectar  Teaspoon -- Pharyngeal -- Pharyngeal- Nectar Cup Reduced epiglottic inversion;Pharyngeal residue - valleculae Pharyngeal -- Pharyngeal- Nectar Straw -- Pharyngeal -- Pharyngeal- Thin Teaspoon -- Pharyngeal -- Pharyngeal- Thin Cup Penetration/Aspiration during swallow Pharyngeal Material enters airway, passes BELOW cords then ejected out;Material enters airway, CONTACTS cords and not ejected out Pharyngeal- Thin Straw -- Pharyngeal -- Pharyngeal- Puree -- Pharyngeal -- Pharyngeal- Mechanical Soft WFL Pharyngeal -- Pharyngeal- Regular WFL Pharyngeal -- Pharyngeal- Multi-consistency -- Pharyngeal -- Pharyngeal- Pill -- Pharyngeal -- Pharyngeal Comment --  CHL IP CERVICAL ESOPHAGEAL PHASE 02/14/2018 Cervical Esophageal Phase WFL Pudding Teaspoon -- Pudding Cup -- Honey Teaspoon -- Honey Cup -- Nectar Teaspoon -- Nectar Cup -- Nectar Straw -- Thin Teaspoon -- Thin Cup -- Thin Straw -- Puree -- Mechanical Soft -- Regular -- Multi-consistency -- Pill -- Cervical Esophageal Comment -- No flowsheet data found. [name] 02/14/2018, 10:10 AM [name].Ed CCC-SLP Pager [phone]                Medications:  Scheduled Medications: . amiodarone  200 mg Oral BID  . aspirin EC  81 mg Oral Daily  . atorvastatin  80 mg Oral q1800  . bisacodyl  10 mg Oral Daily   Or  . bisacodyl  10 mg Rectal Daily  . Chlorhexidine Gluconate Cloth  6 each Topical Daily  . clopidogrel  75 mg Oral Daily  . digoxin  0.125 mg Oral Daily  . docusate sodium  200 mg Oral Daily  . enoxaparin (LOVENOX) injection  40 mg Subcutaneous Q24H  . feeding supplement (ENSURE ENLIVE)  237 mL Oral TID BM  . furosemide  80 mg Intravenous BID  . ivabradine  2.5 mg Oral BID WC  . levalbuterol  1.25 mg Nebulization TID  . mouth rinse  15 mL Mouth Rinse BID  . pantoprazole  40 mg Oral Daily  . sodium chloride flush  10-40 mL Intracatheter Q12H  . spironolactone  25 mg Oral Daily    Infusions: . sodium chloride 10 mL/hr at  02/14/18 0800  . cefTAZidime (FORTAZ)  IV Stopped (02/15/18 0540)  . milrinone 0.25 mcg/kg/min (02/14/18 1224)  . potassium chloride 10 mEq (02/15/18 0708)  . vancomycin Stopped (02/14/18 2203)    PRN Medications: ALPRAZolam, levalbuterol, metoprolol tartrate, morphine injection, ondansetron (ZOFRAN) IV, oxyCODONE, RESOURCE THICKENUP CLEAR, sodium chloride flush, traMADol  Patient Profile   Deriona Altemose is a 57 y/o woman with a history of PAF, preveious smoker quit 4 years ago, hypertension, 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 She was admitted with NSTEMI and shock.  Emergent cath 02/03/18 showed LAD 100% stenosed, LCx 95% stenosed. Taken for emergent CABG 02/03/18  Assessment/Plan   1. NSTEMI/STEMI s/p Emergent CABG 02/03/18 - New LBBB on ECG - cath with severe 2v CAD as above with likely severe LV dysfunction/ICM - s/p Emergent CABG 02/03/18. On aspirin, plavix and atorvastatin.  - No s/s ischemia   2. Acute systolic HF -> cardiogenic shock in setting of #1 - TEE 02/03/18 LVEF 20%, Mild to Mod AR, Mild/Mod MR, RV normal.  - Impella out 3/1 - CO-OX 57%  milrinone 0.25 mcg.  - Echo with at least moderate post-op AI which is likely contributing to HF - CVP 7.  CXR worse today. BNP remains elevated. Continue lasix 80 mg IV twice a day. Supp K.  - Continue spiro to 25 mg daily.  - Renal function stable.   - No bb. Continue digoxin 0.125 mg daily. HR much improved on corlanor. Will increase to 5 bid improved on corlanor.   - Will not wean milrinone yet - Discussed at VAD MRB 02/04/18 for potential of durable VAD placement.   3. Acute respiratory failure d/t pulmonary edema - Extubated 3/1.  - WBC 17>15.9>15.3  - On vanc and fortaz.  - Still requiring intermittent BIPAP. CXR with worsening left-sided airspace disease today. Continue broad spectrum abx. Give one dose IV  lasix - Speech swallow team following,   4. Tachycardia -SInus Tach 100s.  -- No BB yet with cardiogenic shock.  - Watch closely for sepsis - Goal K > 4.0 and Mg > 2.0.  5. PAF - Previously not on AC. - CHA2DS2/VASc is at least 4. (CHF, Vasc disease, HTN, Female)  - Remains in NSR - On lovenox for DVT.  6. H/o SCLC - s/p treatment 2015. Lost to f/u since 04/2015. Will need f/u with oncology (Dr. Julien Nordmann) as outpatient. - No  change.  - Will need Chest CT prior to d/c  7. Dysuria Continue foley for now.  Urine CX- -->pseudomonas aeruginosa. 60,000 Should be covered with fortaz.   8. Anemia Hgb 7.8> 9.7  S/P 1UPRBCs 3.05/2017   9. Hypokalemia Supp K    Medication concerns reviewed with patient and pharmacy team. Barriers identified: None at this time.   Length of Stay: Wilder, NP  02/15/2018, 7:41 AM  Advanced Heart Failure Team Pager [phone] (M-F; 7a - 4p)  Please contact [facility] for night-coverage after hours (4p -7a ) and weekends on [email or web address]  Patient seen and examined with Darrick Grinder, NP. We discussed all aspects of the encounter. I agree with the assessment and plan as stated above.   She remains very tenuous. Still requiring BIPAP at times. CXR worse. Co-ox marginal on milrinone 0.25. Echo with at least 2+ post-op AI.  Will continue milrinone. HR better on Corlanor. Will increase Corlanor to 5 bid. Continue IV lasix - may need one dose metoalzone. Continue broad spectrum abx. Supp K.  Glori Bickers, MD  8:54 AM

## 2018-02-15 NOTE — Progress Notes (Signed)
      [address] VillageSuite [address]       ,[place] [address]             [phone]        CARDIOTHORACIC SURGERY PROGRESS NOTE   R7 Days Post-Op Procedure(s) (LRB): REMOVAL OF IMPELLA LEFT VENTRICULAR ASSIST DEVICE (N/A) TRANSESOPHAGEAL ECHOCARDIOGRAM (TEE) (N/A)  Subjective: Looks good.  Reports feeling well.  Making slow but steady progress  Objective: Vital signs: BP Readings from Last 1 Encounters:  02/15/18 125/76   Pulse Readings from Last 1 Encounters:  02/15/18 95   Resp Readings from Last 1 Encounters:  02/15/18 (!) 23   Temp Readings from Last 1 Encounters:  02/15/18 98.4 F (36.9 C) (Axillary)    Hemodynamics: CVP:  [9 mmHg-10 mmHg] 9 mmHg  Mixed venous co-ox 58%   Physical Exam:  Rhythm:   sinus  Breath sounds: clear  Heart sounds:  RRR  Incisions:  Clean and dry  Abdomen:  Soft, non-distended, non-tender  Extremities:  Warm, well-perfused    Intake/Output from previous day: 03/07 0701 - 03/08 0700 In: 1188.8 [P.O.:360; I.V.:328.8; IV Piggyback:500] Out: 500 [Urine:500] Intake/Output this shift: No intake/output data recorded.  Lab Results:  CBC: Recent Labs    02/14/18 0551 02/15/18 0449  WBC 17.1* 15.3*  HGB 10.1* 9.7*  HCT 31.1* 29.6*  PLT 400 395    BMET:  Recent Labs    02/14/18 0551 02/15/18 0449  NA 137 137  K 4.4 2.9*  CL 95* 93*  CO2 32 34*  GLUCOSE 148* 193*  BUN 19 17  CREATININE 0.72 0.57  CALCIUM 8.7* 8.4*    Results for YALEXA, BLUST (MRN [id number]) as of 02/15/2018 07:58  Ref. Range 02/15/2018 04:49  B Natriuretic Peptide Latest Ref Range: 0.0 - 100.0 pg/mL 2,526.7 (H)     PT/INR:  No results for input(s): LABPROT, INR in the last 72 hours.  CBG (last 3)  No results for input(s): GLUCAP in the last 72 hours.  ABG    Component Value Date/Time   PHART 7.433 02/09/2018 0415   PCO2ART 41.7 02/09/2018 0415   PO2ART 72.0 (L) 02/09/2018 0415   HCO3 28.0 02/09/2018 0415   TCO2 29 02/09/2018  1735   ACIDBASEDEF 1.0 02/08/2018 2117   O2SAT 57.7 02/15/2018 0458    CXR: Somewhat increased opacity left hemithorax, CHF persists  Assessment/Plan: S/P Procedure(s) (LRB): REMOVAL OF IMPELLA LEFT VENTRICULAR ASSIST DEVICE (N/A) TRANSESOPHAGEAL ECHOCARDIOGRAM (TEE) (N/A)  Clinically stable and slowly improving but remains very tenuous due to acute systolic CHF Maintaining NSR - sinus tach on milrinone 0.25, CVP9 Marginal oxygenation w/ CXR demonstrating mild CHF, underlying chronic lung disease, somewhat increased opacity left side Leukocytosis w/out fever, trending down, on empiric ABx for possible HCAP Expected post op acute blood loss anemia,stable Hypokalemia, induced by loop diuretics   Supplement potassium  Repeat CXR tomorrow  Continue ABx for now  Otherwise Rx per advanced heart failure team   Rexene Alberts, MD 02/15/2018 7:56 AM

## 2018-02-15 NOTE — Progress Notes (Signed)
Patient ID: Brittany [name], female   DOB: 02-21-1961, 57 y.o.   MRN: [id number] TCTS Evening Rounds:  Hemodynamically stable on milrinone 0.25 CVP 8  sats 100% on HFNC 10L Stable day.

## 2018-02-16 ENCOUNTER — Inpatient Hospital Stay (HOSPITAL_COMMUNITY): Payer: BLUE CROSS/BLUE SHIELD

## 2018-02-16 LAB — POCT I-STAT, CHEM 8
BUN: 20 mg/dL (ref 6–20)
CALCIUM ION: 1.13 mmol/L — AB (ref 1.15–1.40)
Chloride: 88 mmol/L — ABNORMAL LOW (ref 101–111)
Creatinine, Ser: 0.6 mg/dL (ref 0.44–1.00)
GLUCOSE: 170 mg/dL — AB (ref 65–99)
HCT: 33 % — ABNORMAL LOW (ref 36.0–46.0)
HEMOGLOBIN: 11.2 g/dL — AB (ref 12.0–15.0)
Potassium: 5.9 mmol/L — ABNORMAL HIGH (ref 3.5–5.1)
Sodium: 133 mmol/L — ABNORMAL LOW (ref 135–145)
TCO2: 40 mmol/L — ABNORMAL HIGH (ref 22–32)

## 2018-02-16 LAB — CBC
HCT: 29.7 % — ABNORMAL LOW (ref 36.0–46.0)
HEMOGLOBIN: 9.6 g/dL — AB (ref 12.0–15.0)
MCH: 30.5 pg (ref 26.0–34.0)
MCHC: 32.3 g/dL (ref 30.0–36.0)
MCV: 94.3 fL (ref 78.0–100.0)
Platelets: 389 10*3/uL (ref 150–400)
RBC: 3.15 MIL/uL — ABNORMAL LOW (ref 3.87–5.11)
RDW: 16.8 % — ABNORMAL HIGH (ref 11.5–15.5)
WBC: 14.9 10*3/uL — ABNORMAL HIGH (ref 4.0–10.5)

## 2018-02-16 LAB — BASIC METABOLIC PANEL
Anion gap: 10 (ref 5–15)
Anion gap: 12 (ref 5–15)
BUN: 14 mg/dL (ref 6–20)
BUN: 14 mg/dL (ref 6–20)
CALCIUM: 8.8 mg/dL — AB (ref 8.9–10.3)
CO2: 34 mmol/L — ABNORMAL HIGH (ref 22–32)
CO2: 32 mmol/L (ref 22–32)
Calcium: 8.7 mg/dL — ABNORMAL LOW (ref 8.9–10.3)
Chloride: 93 mmol/L — ABNORMAL LOW (ref 101–111)
Chloride: 91 mmol/L — ABNORMAL LOW (ref 101–111)
Creatinine, Ser: 0.56 mg/dL (ref 0.44–1.00)
Creatinine, Ser: 0.62 mg/dL (ref 0.44–1.00)
GFR calc Af Amer: >60 mL/min (ref >60)
GFR calc Af Amer: >60 mL/min (ref >60)
GLUCOSE: 148 mg/dL — AB (ref 65–99)
GLUCOSE: 168 mg/dL — AB (ref 65–99)
POTASSIUM: 3.3 mmol/L — AB (ref 3.5–5.1)
Potassium: 3.9 mmol/L (ref 3.5–5.1)
Sodium: 137 mmol/L (ref 135–145)
Sodium: 135 mmol/L (ref 135–145)

## 2018-02-16 LAB — POCT I-STAT 4, (NA,K, GLUC, HGB,HCT)
Glucose, Bld: 229 mg/dL — ABNORMAL HIGH (ref 65–99)
HEMATOCRIT: 33 % — AB (ref 36.0–46.0)
HEMOGLOBIN: 11.2 g/dL — AB (ref 12.0–15.0)
POTASSIUM: 5.1 mmol/L (ref 3.5–5.1)
SODIUM: 132 mmol/L — AB (ref 135–145)

## 2018-02-16 LAB — VANCOMYCIN, TROUGH: VANCOMYCIN TR: 13 ug/mL — AB (ref 15–20)

## 2018-02-16 LAB — COOXEMETRY PANEL
Carboxyhemoglobin: 1.6 % — ABNORMAL HIGH (ref 0.5–1.5)
METHEMOGLOBIN: 1.4 % (ref 0.0–1.5)
O2 Saturation: 60.8 %
Total hemoglobin: 10.9 g/dL — ABNORMAL LOW (ref 12.0–16.0)

## 2018-02-16 MED ORDER — IPRATROPIUM-ALBUTEROL 0.5-2.5 (3) MG/3ML IN SOLN
3.0000 mL | Freq: Four times a day (QID) | RESPIRATORY_TRACT | Status: DC
  Administered 2018-02-16 (×2): 3 mL via RESPIRATORY_TRACT
  Filled 2018-02-16 (×3): qty 3

## 2018-02-16 MED ORDER — LOSARTAN POTASSIUM 25 MG PO TABS
12.5000 mg | ORAL_TABLET | Freq: Every day | ORAL | Status: DC
  Administered 2018-02-16: 12.5 mg via ORAL
  Filled 2018-02-16: qty 1

## 2018-02-16 MED ORDER — POTASSIUM CHLORIDE CRYS ER 20 MEQ PO TBCR
40.0000 meq | EXTENDED_RELEASE_TABLET | Freq: Once | ORAL | Status: DC
  Filled 2018-02-16: qty 2

## 2018-02-16 MED ORDER — POTASSIUM CHLORIDE 10 MEQ/50ML IV SOLN
10.0000 meq | INTRAVENOUS | Status: AC
  Administered 2018-02-16 (×3): 10 meq via INTRAVENOUS
  Filled 2018-02-16 (×3): qty 50

## 2018-02-16 NOTE — Progress Notes (Signed)
Pt called out complaining of shortness of breath. Pt placed on Bipap by RT. RN requested RT to administer breathing treatment. Treatment administered with improvement in respiratory status. RN will continue to monitor.

## 2018-02-16 NOTE — Progress Notes (Signed)
Patient ID: Brittany [name], female   DOB: 1961-02-10, 57 y.o.   MRN: [id number]      Advanced Heart Failure Rounding Note  PCP-Cardiologist: No primary care provider on file.   Subjective:    Events S/p emergent CABG 02/03/18 IABP removed 02/04/18 Impella out 02/08/18 Extubated 02/08/18  Remains on milrinone 0.25 mcg. CO-OX 61%.  CVP 12-13 today.  Weight down with diuresis. She is on nasal cannula this morning.  Already has walked in the halls, breathing better.   CXR with improving pulmonary edema.   ECHO 3/4 EF 25-30%. Grade II DD. Moderate-severe aortic insufficiency.   TEE 02/03/18 LVEF 20%, Mild to Mod AR, Mild/Mod MR, RV normal.   Objective:   Weight Range: 106 lb 12.8 oz (48.4 kg) Body mass index is 20.18 kg/m.   Vital Signs:   Temp:  [98.3 F (36.8 C)-98.9 F (37.2 C)] 98.8 F (37.1 C) (03/09 0730) Pulse Rate:  [81-93] 93 (03/09 1000) Resp:  [11-24] 19 (03/09 1000) BP: (85-128)/(50-81) 94/68 (03/09 1000) SpO2:  [93 %-100 %] 98 % (03/09 1000) FiO2 (%):  [40 %] 40 % (03/09 0329) Weight:  [106 lb 12.8 oz (48.4 kg)] 106 lb 12.8 oz (48.4 kg) (03/09 0500) Last BM Date: 02/15/18  Weight change: Filed Weights   02/14/18 0546 02/15/18 0600 02/16/18 0500  Weight: 109 lb 9.1 oz (49.7 kg) 111 lb 1.8 oz (50.4 kg) 106 lb 12.8 oz (48.4 kg)    Intake/Output:   Intake/Output Summary (Last 24 hours) at 02/16/2018 1054 Last data filed at 02/16/2018 1004 Gross per 24 hour  Intake 2047.7 ml  Output 1450 ml  Net 597.7 ml    Physical Exam   CVP 12-13  General: NAD Neck: JVP 12-14, no thyromegaly or thyroid nodule.  Lungs: Mild crackles at bases.  CV: Nondisplaced PMI.  Heart regular S1/S2, no S3/S4, 1/6 SEM RUSB.  No peripheral edema.   Abdomen: Soft, nontender, no hepatosplenomegaly, no distention.  Skin: Intact without lesions or rashes.  Neurologic: Alert and oriented x 3.  Psych: Normal affect. Extremities: No clubbing or cyanosis.  HEENT: Normal.      Telemetry   NSR 90s, personally reviewed  Labs    CBC Recent Labs    02/15/18 0449 02/16/18 0840  WBC 15.3* 14.9*  HGB 9.7* 9.6*  HCT 29.6* 29.7*  MCV 92.5 94.3  PLT 395 314   Basic Metabolic Panel Recent Labs    02/14/18 0551 02/15/18 0449 02/16/18 0356  NA 137 137 137  K 4.4 2.9* 3.3*  CL 95* 93* 93*  CO2 32 34* 34*  GLUCOSE 148* 193* 148*  BUN 19 17 14   CREATININE 0.72 0.57 0.56  CALCIUM 8.7* 8.4* 8.7*  MG 2.1  --   --    Liver Function Tests No results for input(s): AST, ALT, ALKPHOS, BILITOT, PROT, ALBUMIN in the last 72 hours. No results for input(s): LIPASE, AMYLASE in the last 72 hours. Cardiac Enzymes No results for input(s): CKTOTAL, CKMB, CKMBINDEX, TROPONINI in the last 72 hours.  BNP: BNP (last 3 results) Recent Labs    02/03/18 0853 02/15/18 0449  BNP 3,127.4* 2,526.7*    ProBNP (last 3 results) No results for input(s): PROBNP in the last 8760 hours.   D-Dimer No results for input(s): DDIMER in the last 72 hours. Hemoglobin A1C No results for input(s): HGBA1C in the last 72 hours. Fasting Lipid Panel No results for input(s): CHOL, HDL, LDLCALC, TRIG, CHOLHDL, LDLDIRECT in the last 72 hours. Thyroid Function  Tests No results for input(s): TSH, T4TOTAL, T3FREE, THYROIDAB in the last 72 hours.  Invalid input(s): FREET3  Other results:   Imaging    Dg Chest Port 1 View  Result Date: 02/16/2018 CLINICAL DATA:  CHF EXAM: PORTABLE CHEST 1 VIEW COMPARISON:  02/15/2018 FINDINGS: Prior CABG. Cardiomegaly. Diffuse interstitial prominence throughout the lungs has improved slightly since prior study, likely improving edema. No visible effusions. No acute bony abnormality. IMPRESSION: Improving interstitial prominence throughout the lungs, likely improving edema/CHF. Electronically Signed   By: Rolm Baptise M.D.   On: 02/16/2018 07:31     Medications:     Scheduled Medications: . amiodarone  200 mg Oral BID  . aspirin EC  81 mg Oral  Daily  . atorvastatin  80 mg Oral q1800  . Chlorhexidine Gluconate Cloth  6 each Topical Daily  . clopidogrel  75 mg Oral Daily  . digoxin  0.125 mg Oral Daily  . enoxaparin (LOVENOX) injection  40 mg Subcutaneous Q24H  . feeding supplement (ENSURE ENLIVE)  237 mL Oral TID BM  . furosemide  80 mg Intravenous BID  . ipratropium-albuterol  3 mL Nebulization Q6H  . ivabradine  5 mg Oral BID WC  . losartan  12.5 mg Oral Daily  . mouth rinse  15 mL Mouth Rinse BID  . pantoprazole  40 mg Oral Daily  . potassium chloride  40 mEq Oral Once  . sodium chloride flush  10-40 mL Intracatheter Q12H  . spironolactone  25 mg Oral Daily    Infusions: . sodium chloride 10 mL/hr at 02/16/18 0600  . cefTAZidime (FORTAZ)  IV 1 g (02/16/18 0539)  . milrinone 0.25 mcg/kg/min (02/16/18 0600)  . 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 She was admitted with NSTEMI and shock.  Emergent cath 02/03/18 showed LAD 100% stenosed, LCx 95% stenosed. Taken for emergent CABG 02/03/18  Assessment/Plan   1. 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  Cath with severe 2v CAD as above with severe LV dysfunction/ICM. s/p Emergent CABG 02/03/18.  No chest pain.  - On aspirin, plavix and atorvastatin.  2. Acute systolic HF -> cardiogenic shock in setting of #1: TEE 02/03/18 LVEF 20%, Mild to Mod AR, Mild/Mod MR, RV normal. Impella out 3/1.  Echo 3/4 with moderate-severe aortic insufficiency which is likely contributing to CHF.  Co-ox 61% today, CVP 12-13. SBP 90s-100s.  CXR with improving pulmonary edema.  - Continue Lasix 80 mg IV bid, weight down on this dose yesterday.   - Continue milrinone 0.25, will not try to wean until we have fully diuresed her.  - Continue current digoxin, spironolactone, ivabradine.  - Add losartan 12.5 daily today. 3. Atrial fibrillation: Paroxysmal, post-op.  Now in NSR.  CHA2DS2/VASc is at least 4. (CHF, Vasc disease, HTN, Female).   - Continue po amiodarone 200 bid.  - Currently not anticoagulated, will discuss with team (she is on ASA and Plavix).  4. 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 Will need f/u with oncology (Dr. Julien Nordmann) as outpatient.  - Will need Chest CT  prior to d/c 5. Anemia: stable hemoglobin today.  6. Aortic insufficiency: Moderate-severe on postop echo.  Likely potentiating CHF.  Add afterload reduction with low dose ARB today.   Out of bed, ambulate.   Length of Stay: 33  Loralie Champagne, MD  02/16/2018, 10:54 AM  Advanced Heart Failure Team Pager [phone] (M-F; 7a - 4p)  Please contact [facility] for night-coverage after hours (4p -7a ) and weekends on [email or web address]

## 2018-02-16 NOTE — Progress Notes (Signed)
8 Days Post-Op Procedure(s) (LRB): REMOVAL OF IMPELLA LEFT VENTRICULAR ASSIST DEVICE (N/A) TRANSESOPHAGEAL ECHOCARDIOGRAM (TEE) (N/A) Subjective: Feels ok. Ambulated this am and says her breathing is better today.  Wt down to 107 with diuresis.  Objective: Vital signs in last 24 hours: Temp:  [98.3 F (36.8 C)-98.9 F (37.2 C)] 98.8 F (37.1 C) (03/09 1210) Pulse Rate:  [81-93] 81 (03/09 1348) Cardiac Rhythm: Normal sinus rhythm (03/09 0800) Resp:  [11-22] 15 (03/09 1348) BP: (85-126)/(50-95) 115/66 (03/09 1348) SpO2:  [93 %-100 %] 97 % (03/09 1348) FiO2 (%):  [40 %] 40 % (03/09 0329) Weight:  [48.4 kg (106 lb 12.8 oz)] 48.4 kg (106 lb 12.8 oz) (03/09 0500)  Hemodynamic parameters for last 24 hours: CVP:  [4 mmHg-8 mmHg] 4 mmHg  Intake/Output from previous day: 03/08 0701 - 03/09 0700 In: 1855.1 [P.O.:720; I.V.:285.1; IV Piggyback:850] Out: 1700 [Urine:1700] Intake/Output this shift: Total I/O In: 1245.9 [P.O.:600; I.V.:95.9; IV Piggyback:550] Out: 800 [Urine:800]  General appearance: alert and cooperative Heart: regular rate and rhythm, S1, S2 normal, no murmur, click, rub or gallop Lungs: clear to auscultation bilaterally Extremities: extremities normal, atraumatic, no cyanosis or edema Wound: incisions ok  Lab Results: Recent Labs    02/15/18 0449 02/16/18 0840 02/16/18 1359 02/16/18 1435  WBC 15.3* 14.9*  --   --   HGB 9.7* 9.6* 11.2* 11.2*  HCT 29.6* 29.7* 33.0* 33.0*  PLT 395 389  --   --    BMET:  Recent Labs    02/15/18 0449 02/16/18 0356 02/16/18 1359 02/16/18 1435  NA 137 137 132* 133*  K 2.9* 3.3* 5.1 5.9*  CL 93* 93*  --  88*  CO2 34* 34*  --   --   GLUCOSE 193* 148* 229* 170*  BUN 17 14  --  20  CREATININE 0.57 0.56  --  0.60  CALCIUM 8.4* 8.7*  --   --     PT/INR: No results for input(s): LABPROT, INR in the last 72 hours. ABG    Component Value Date/Time   PHART 7.433 02/09/2018 0415   HCO3 28.0 02/09/2018 0415   TCO2 40 (H)  02/16/2018 1435   ACIDBASEDEF 1.0 02/08/2018 2117   O2SAT 60.8 02/16/2018 0411   CBG (last 3)  No results for input(s): GLUCAP in the last 72 hours.  CLINICAL DATA:  CHF  EXAM: PORTABLE CHEST 1 VIEW  COMPARISON:  02/15/2018  FINDINGS: Prior CABG. Cardiomegaly. Diffuse interstitial prominence throughout the lungs has improved slightly since prior study, likely improving edema. No visible effusions. No acute bony abnormality.  IMPRESSION: Improving interstitial prominence throughout the lungs, likely improving edema/CHF.   Electronically Signed   By: Rolm Baptise M.D.   On: 02/16/2018 07:31   Assessment/Plan: S/P Procedure(s) (LRB): REMOVAL OF IMPELLA LEFT VENTRICULAR ASSIST DEVICE (N/A) TRANSESOPHAGEAL ECHOCARDIOGRAM (TEE) (N/A)  She is hemodynamically stable on Milrinone 0.25 with Co-ox 61%. NSR on oral amiodarone.  Weight continues to come down on lasix 80 bid and is below preop if accurate. CXR shows resolving edema.  Continues on [place] for possible HCAP. Afebrile and WBC decreasing.   LOS: 13 days    Gaye Pollack 02/16/2018

## 2018-02-16 NOTE — Progress Notes (Signed)
Pharmacy Antibiotic Note  Brittany [name] is a 57 y.o. female admitted on 02/03/2018 with SOB s/p urgent CABG with need for impella> removed 3/1.  3/4 increased WBC 17 >  Cr stable 0.6 afebrile.   Pharmacy has been consulted for vancomycin and ceftazidime dosing.  Vancomycin trough this morning is just below goal at 13. Patient currently afebrile, clinically improving and vnc to stop after doses tomorrow  - will not make change Pseudomonas in urine> will continue ceftazidime x10 days - stop  Date in computer   Plan: Continue Vancomycin to 1000mg  iv q12h Ceftazidime 1gm IV q8h  Height: 5\' 1"  (154.9 cm) Weight: 106 lb 12.8 oz (48.4 kg) IBW/kg (Calculated) : 47.8  Temp (24hrs), Avg:98.7 F (37.1 C), Min:98.3 F (36.8 C), Max:98.9 F (37.2 C)  Recent Labs  Lab 02/12/18 0300 02/13/18 0315 02/13/18 0529 02/14/18 0551 02/15/18 0449 02/16/18 0356 02/16/18 0840  WBC 17.2* 15.9*  --  17.1* 15.3*  --  14.9*  CREATININE 0.71 0.67  --  0.72 0.57 0.56  --   VANCOTROUGH  --   --  12*  --   --   --  13*    Estimated Creatinine Clearance: 58.5 mL/min (by C-G formula based on SCr of 0.56 mg/dL).    Allergies  Allergen Reactions  . Iodinated Diagnostic Agents Hives and Rash  . Latex Hives and Other (See Comments)    Skin irritation  . Pineapple Hives  . Sulfa Antibiotics Hives and Rash  . Codeine Nausea And Vomiting    Antimicrobials this admission: 2/24 vanc x 1 3/4 > 2/24 zosyn x 1 2/24 Cefuroxime x 4 doses post op 3/1 ancef  Post op>3/4  3/4 ceftaz >  Dose adjustments this admission: 3/6 VT 12 - increase to 1g q12 hours 3/9 VT 13 > no change  Microbiology results: 3/5 urine -pseudomonas - S ceftaz  Bonnita Nasuti Pharm.D. CPP, BCPS Clinical Pharmacist [phone] 02/16/2018 2:29 PM

## 2018-02-17 LAB — BASIC METABOLIC PANEL
ANION GAP: 9 (ref 5–15)
BUN: 16 mg/dL (ref 6–20)
CHLORIDE: 92 mmol/L — AB (ref 101–111)
CO2: 33 mmol/L — ABNORMAL HIGH (ref 22–32)
CREATININE: 0.73 mg/dL (ref 0.44–1.00)
Calcium: 8.8 mg/dL — ABNORMAL LOW (ref 8.9–10.3)
GFR calc non Af Amer: >60 mL/min (ref >60)
Glucose, Bld: 221 mg/dL — ABNORMAL HIGH (ref 65–99)
Potassium: 4.7 mmol/L (ref 3.5–5.1)
Sodium: 134 mmol/L — ABNORMAL LOW (ref 135–145)

## 2018-02-17 LAB — COOXEMETRY PANEL
CARBOXYHEMOGLOBIN: 2 % — AB (ref 0.5–1.5)
Carboxyhemoglobin: 2.1 % — ABNORMAL HIGH (ref 0.5–1.5)
Methemoglobin: 1.4 % (ref 0.0–1.5)
Methemoglobin: 1.3 % (ref 0.0–1.5)
O2 SAT: 96.5 %
O2 SAT: 60.9 %
Total hemoglobin: 10.2 g/dL — ABNORMAL LOW (ref 12.0–16.0)
Total hemoglobin: 10.5 g/dL — ABNORMAL LOW (ref 12.0–16.0)

## 2018-02-17 LAB — CBC
HCT: 31.1 % — ABNORMAL LOW (ref 36.0–46.0)
Hemoglobin: 10 g/dL — ABNORMAL LOW (ref 12.0–15.0)
MCH: 30.2 pg (ref 26.0–34.0)
MCHC: 32.2 g/dL (ref 30.0–36.0)
MCV: 94 fL (ref 78.0–100.0)
PLATELETS: 424 10*3/uL — AB (ref 150–400)
RBC: 3.31 MIL/uL — AB (ref 3.87–5.11)
RDW: 16.9 % — ABNORMAL HIGH (ref 11.5–15.5)
WBC: 14.3 10*3/uL — ABNORMAL HIGH (ref 4.0–10.5)

## 2018-02-17 LAB — MAGNESIUM: MAGNESIUM: 1.6 mg/dL — AB (ref 1.7–2.4)

## 2018-02-17 MED ORDER — FUROSEMIDE 40 MG PO TABS
40.0000 mg | ORAL_TABLET | Freq: Every day | ORAL | Status: DC
  Administered 2018-02-17 – 2018-02-27 (×11): 40 mg via ORAL
  Filled 2018-02-17 (×11): qty 1

## 2018-02-17 MED ORDER — LOSARTAN POTASSIUM 25 MG PO TABS
12.5000 mg | ORAL_TABLET | Freq: Two times a day (BID) | ORAL | Status: DC
  Administered 2018-02-17 – 2018-02-19 (×5): 12.5 mg via ORAL
  Filled 2018-02-17 (×5): qty 1

## 2018-02-17 MED ORDER — IPRATROPIUM-ALBUTEROL 0.5-2.5 (3) MG/3ML IN SOLN
3.0000 mL | Freq: Four times a day (QID) | RESPIRATORY_TRACT | Status: DC
  Administered 2018-02-17: 3 mL via RESPIRATORY_TRACT
  Filled 2018-02-17: qty 3

## 2018-02-17 MED ORDER — IPRATROPIUM-ALBUTEROL 0.5-2.5 (3) MG/3ML IN SOLN
3.0000 mL | Freq: Two times a day (BID) | RESPIRATORY_TRACT | Status: DC
  Administered 2018-02-17 – 2018-02-27 (×17): 3 mL via RESPIRATORY_TRACT
  Filled 2018-02-17 (×20): qty 3

## 2018-02-17 MED ORDER — MAGNESIUM SULFATE 2 GM/50ML IV SOLN
2.0000 g | Freq: Once | INTRAVENOUS | Status: AC
  Administered 2018-02-17: 2 g via INTRAVENOUS
  Filled 2018-02-17: qty 50

## 2018-02-17 NOTE — Progress Notes (Signed)
UP to [facility], pirewick not working well, incontinent of urine, moderate amount, bed linens changed. Patient c/o pain and not feeling well at all". O2 sats good on 3 LPM. Dyspnea on excertion. Medicated with oxycodone patient wishes to rest. purewick replaced.

## 2018-02-17 NOTE — Progress Notes (Signed)
Patient ID: Brittany [name], female   DOB: 1961-03-04, 57 y.o.   MRN: [id number]      Advanced Heart Failure Rounding Note  PCP-Cardiologist: No primary care provider on file.   Subjective:    Events S/p emergent CABG 02/03/18 IABP removed 02/04/18 Impella out 02/08/18 Extubated 02/08/18  Remains on milrinone 0.25 mcg. CO-OX inaccurate (95%).  CVP 4-6 today.  Weight down with diuresis another 2 lbs. She is on nasal cannula this morning at 4L with saturation 99%.  Breathing is getting better.    ECHO 3/4 EF 25-30%. Grade II DD. Moderate-severe aortic insufficiency.   TEE 02/03/18 LVEF 20%, Mild to Mod AR, Mild/Mod MR, RV normal.   Objective:   Weight Range: 104 lb 4.8 oz (47.3 kg) Body mass index is 19.71 kg/m.   Vital Signs:   Temp:  [97.7 F (36.5 C)-99.5 F (37.5 C)] 98.6 F (37 C) (03/10 0313) Pulse Rate:  [79-93] 91 (03/10 0600) Resp:  [11-22] 21 (03/10 0600) BP: (91-125)/(56-95) 93/56 (03/10 0500) SpO2:  [93 %-100 %] 99 % (03/10 0600) FiO2 (%):  [40 %] 40 % (03/10 0313) Weight:  [104 lb 4.8 oz (47.3 kg)] 104 lb 4.8 oz (47.3 kg) (03/10 0600) Last BM Date: 02/15/18  Weight change: Filed Weights   02/15/18 0600 02/16/18 0500 02/17/18 0600  Weight: 111 lb 1.8 oz (50.4 kg) 106 lb 12.8 oz (48.4 kg) 104 lb 4.8 oz (47.3 kg)    Intake/Output:   Intake/Output Summary (Last 24 hours) at 02/17/2018 0744 Last data filed at 02/17/2018 0500 Gross per 24 hour  Intake 2585.1 ml  Output 1600 ml  Net 985.1 ml    Physical Exam   CVP 4-5 General: NAD Neck: No JVD, no thyromegaly or thyroid nodule.  Lungs: Clear to auscultation bilaterally with normal respiratory effort. CV: Nondisplaced PMI.  Heart regular S1/S2, no S3/S4, 1/6 SEM RUSB.  No peripheral edema.   Abdomen: Soft, nontender, no hepatosplenomegaly, no distention.  Skin: Intact without lesions or rashes.  Neurologic: Alert and oriented x 3.  Psych: Normal affect. Extremities: No clubbing or cyanosis.  HEENT:  Normal.     Telemetry   NSR 90s, personally reviewed  Labs    CBC Recent Labs    02/16/18 0840  02/16/18 1435 02/17/18 0402  WBC 14.9*  --   --  14.3*  HGB 9.6*   < > 11.2* 10.0*  HCT 29.7*   < > 33.0* 31.1*  MCV 94.3  --   --  94.0  PLT 389  --   --  424*   < > = values in this interval not displayed.   Basic Metabolic Panel Recent Labs    02/16/18 1512 02/17/18 0402  NA 135 134*  K 3.9 4.7  CL 91* 92*  CO2 32 33*  GLUCOSE 168* 221*  BUN 14 16  CREATININE 0.62 0.73  CALCIUM 8.8* 8.8*  MG  --  1.6*   Liver Function Tests No results for input(s): AST, ALT, ALKPHOS, BILITOT, PROT, ALBUMIN in the last 72 hours. No results for input(s): LIPASE, AMYLASE in the last 72 hours. Cardiac Enzymes No results for input(s): CKTOTAL, CKMB, CKMBINDEX, TROPONINI in the last 72 hours.  BNP: BNP (last 3 results) Recent Labs    02/03/18 0853 02/15/18 0449  BNP 3,127.4* 2,526.7*    ProBNP (last 3 results) No results for input(s): PROBNP in the last 8760 hours.   D-Dimer No results for input(s): DDIMER in the last 72 hours. Hemoglobin A1C  No results for input(s): HGBA1C in the last 72 hours. Fasting Lipid Panel No results for input(s): CHOL, HDL, LDLCALC, TRIG, CHOLHDL, LDLDIRECT in the last 72 hours. Thyroid Function Tests No results for input(s): TSH, T4TOTAL, T3FREE, THYROIDAB in the last 72 hours.  Invalid input(s): FREET3  Other results:   Imaging    No results found.   Medications:     Scheduled Medications: . amiodarone  200 mg Oral BID  . aspirin EC  81 mg Oral Daily  . atorvastatin  80 mg Oral q1800  . Chlorhexidine Gluconate Cloth  6 each Topical Daily  . clopidogrel  75 mg Oral Daily  . digoxin  0.125 mg Oral Daily  . enoxaparin (LOVENOX) injection  40 mg Subcutaneous Q24H  . feeding supplement (ENSURE ENLIVE)  237 mL Oral TID BM  . furosemide  40 mg Oral Daily  . ipratropium-albuterol  3 mL Nebulization QID  . ivabradine  5 mg Oral BID  WC  . losartan  12.5 mg Oral BID  . mouth rinse  15 mL Mouth Rinse BID  . pantoprazole  40 mg Oral Daily  . potassium chloride  40 mEq Oral Once  . sodium chloride flush  10-40 mL Intracatheter Q12H  . spironolactone  25 mg Oral Daily    Infusions: . sodium chloride 10 mL/hr at 02/17/18 0500  . cefTAZidime (FORTAZ)  IV 1 g (02/17/18 0525)  . magnesium sulfate 1 - 4 g bolus IVPB    . milrinone 0.25 mcg/kg/min (02/17/18 0500)  . 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 She was admitted with NSTEMI and shock.  Emergent cath 02/03/18 showed LAD 100% stenosed, LCx 95% stenosed. Taken for emergent CABG 02/03/18  Assessment/Plan   1. 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  Cath with severe 2v CAD as above with severe LV dysfunction/ICM. s/p Emergent CABG 02/03/18.  No chest pain.  - On aspirin, plavix and atorvastatin.  2. Acute systolic HF -> cardiogenic shock in setting of #1: TEE 02/03/18 LVEF 20%, Mild to Mod AR, Mild/Mod MR, RV normal. Impella out 3/1.  Echo 3/4 with moderate-severe aortic insufficiency which is likely contributing to CHF. Co-ox inaccurate today but was good yesterday, CVP 4-6. SBP 90s-100s.  - Stop IV Lasix, start Lasix 40 mg po daily for now.  - Decrease milrinone to 0.125 and repeat co-ox.   - Continue current digoxin, spironolactone, ivabradine.  - Increase losartan to 12.5 mg bid.  3. Atrial fibrillation: Paroxysmal, post-op.  Now in NSR.  CHA2DS2/VASc is at least 4.  (CHF, Vasc disease, HTN, Female).   - Continue po amiodarone 200 bid.  - Currently not anticoagulated, will discuss with team (she is on ASA and Plavix).  4. 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 Will need f/u with oncology (Dr. Julien Nordmann) as outpatient.  - Will need chest CT prior to d/c 5. Anemia: stable hemoglobin today.  6. Aortic insufficiency: Moderate-severe on postop echo.  Likely potentiating CHF.  Added afterload reduction with ARB. 7. ID: Covering for PNA with vancomycin/ceftazidime.  Out of bed,  ambulate.   Length of Stay: [place], MD  02/17/2018, 7:44 AM  Advanced Heart Failure Team Pager [phone] (M-F; 7a - 4p)  Please contact [facility] for night-coverage after hours (4p -7a ) and weekends on [email or web address]

## 2018-02-17 NOTE — Progress Notes (Signed)
C/O general soreness when getting up and moving that she has not felt. Previously as much. Dyspnea continues on exertion. Saturations remain > 90% on 3LPM HFNC. Patient wished to sleep in between activity, slightly nauseous. Refusing ambulation or chair at this time. Encouraged IS throughout the day and sternal precautions.

## 2018-02-17 NOTE — Progress Notes (Signed)
9 Days Post-Op Procedure(s) (LRB): REMOVAL OF IMPELLA LEFT VENTRICULAR ASSIST DEVICE (N/A) TRANSESOPHAGEAL ECHOCARDIOGRAM (TEE) (N/A) Subjective: No complaints, feels better, breathing improved. Ambulated around the ICU without difficulty  Milrinone decreased to 0.125 this am.  Objective: Vital signs in last 24 hours: Temp:  [97.7 F (36.5 C)-99.5 F (37.5 C)] 99.1 F (37.3 C) (03/10 1139) Pulse Rate:  [79-91] 91 (03/10 0600) Cardiac Rhythm: Normal sinus rhythm (03/10 0730) Resp:  [12-22] 21 (03/10 0600) BP: (91-125)/(56-71) 93/56 (03/10 0500) SpO2:  [95 %-100 %] 100 % (03/10 0822) FiO2 (%):  [40 %] 40 % (03/10 0313) Weight:  [47.3 kg (104 lb 4.8 oz)] 47.3 kg (104 lb 4.8 oz) (03/10 0600)  Hemodynamic parameters for last 24 hours: CVP:  [12 mmHg] 12 mmHg  Intake/Output from previous day: 03/09 0701 - 03/10 0700 In: 2585.1 [P.O.:1320; I.V.:315.1; IV Piggyback:950] Out: 1600 [Urine:1600] Intake/Output this shift: No intake/output data recorded.  General appearance: alert and cooperative Heart: regular rate and rhythm, S1, S2 normal, no murmur, click, rub or gallop Lungs: rhonchi bilaterally Extremities: extremities normal, atraumatic, no cyanosis or edema Wound: incisions ok  Lab Results: Recent Labs    02/16/18 0840  02/16/18 1435 02/17/18 0402  WBC 14.9*  --   --  14.3*  HGB 9.6*   < > 11.2* 10.0*  HCT 29.7*   < > 33.0* 31.1*  PLT 389  --   --  424*   < > = values in this interval not displayed.   BMET:  Recent Labs    02/16/18 1512 02/17/18 0402  NA 135 134*  K 3.9 4.7  CL 91* 92*  CO2 32 33*  GLUCOSE 168* 221*  BUN 14 16  CREATININE 0.62 0.73  CALCIUM 8.8* 8.8*    PT/INR: No results for input(s): LABPROT, INR in the last 72 hours. ABG    Component Value Date/Time   PHART 7.433 02/09/2018 0415   HCO3 28.0 02/09/2018 0415   TCO2 40 (H) 02/16/2018 1435   ACIDBASEDEF 1.0 02/08/2018 2117   O2SAT 60.9 02/17/2018 1030   CBG (last 3)  No results for  input(s): GLUCAP in the last 72 hours.  Assessment/Plan: S/P Procedure(s) (LRB): REMOVAL OF IMPELLA LEFT VENTRICULAR ASSIST DEVICE (N/A) TRANSESOPHAGEAL ECHOCARDIOGRAM (TEE) (N/A)  She is hemodynamically stable on Milrinone 0.125. Co-ox 61%. Heart failure meds per AHF team. Echo had shown mod-severe AI but I don't hear any murmur.  PAF in sinus rhythm now on oral amio. Discuss anticoagulation with Dr. Roxy Manns.  Remains on vanc and Fortaz for possible pneumonia.  Continue IS, ambulation, nutrition.   LOS: 14 days    Gaye Pollack 02/17/2018

## 2018-02-17 NOTE — Plan of Care (Signed)
Very tired in between treatments and mobility. Clustered care. Weaning down o2 to keep saturations > 92%. Patient with DOE. Needs a few minutes to recoup from activity. Encouraged eating, no swallowing difficulty noted. Speech re- eval tomorrow. Patient wishes to eat regular food.

## 2018-02-18 ENCOUNTER — Inpatient Hospital Stay (HOSPITAL_COMMUNITY): Payer: BLUE CROSS/BLUE SHIELD

## 2018-02-18 LAB — CBC
HEMATOCRIT: 31.8 % — AB (ref 36.0–46.0)
HEMOGLOBIN: 10.1 g/dL — AB (ref 12.0–15.0)
MCH: 29.7 pg (ref 26.0–34.0)
MCHC: 31.8 g/dL (ref 30.0–36.0)
MCV: 93.5 fL (ref 78.0–100.0)
Platelets: 463 10*3/uL — ABNORMAL HIGH (ref 150–400)
RBC: 3.4 MIL/uL — ABNORMAL LOW (ref 3.87–5.11)
RDW: 16.5 % — ABNORMAL HIGH (ref 11.5–15.5)
WBC: 15.5 10*3/uL — ABNORMAL HIGH (ref 4.0–10.5)

## 2018-02-18 LAB — BASIC METABOLIC PANEL
ANION GAP: 8 (ref 5–15)
BUN: 16 mg/dL (ref 6–20)
CALCIUM: 8.9 mg/dL (ref 8.9–10.3)
CHLORIDE: 96 mmol/L — AB (ref 101–111)
CO2: 30 mmol/L (ref 22–32)
Creatinine, Ser: 0.82 mg/dL (ref 0.44–1.00)
GFR calc Af Amer: >60 mL/min (ref >60)
GFR calc non Af Amer: >60 mL/min (ref >60)
GLUCOSE: 162 mg/dL — AB (ref 65–99)
Potassium: 4.2 mmol/L (ref 3.5–5.1)
Sodium: 134 mmol/L — ABNORMAL LOW (ref 135–145)

## 2018-02-18 LAB — MAGNESIUM: MAGNESIUM: 2.2 mg/dL (ref 1.7–2.4)

## 2018-02-18 LAB — COOXEMETRY PANEL
CARBOXYHEMOGLOBIN: 2 % — AB (ref 0.5–1.5)
Methemoglobin: 0.8 % (ref 0.0–1.5)
O2 Saturation: 63.2 %
Total hemoglobin: 16.7 g/dL — ABNORMAL HIGH (ref 12.0–16.0)

## 2018-02-18 NOTE — Progress Notes (Signed)
Patient is currently on 4LNC with sats of 100%. Patient states she does not need BIPAP at this time. BIPAP is in room on standby. Patient knows to have RN call if and when she is ready to be put on. RN aware.

## 2018-02-18 NOTE — Progress Notes (Signed)
Pt taken off Bipap and placed on 4L [place] at this time. VS within normal limits, no distress noted.

## 2018-02-18 NOTE — Progress Notes (Addendum)
Patient refusing to get up to chair or ambulate. As I discussed importance of ambulation on muscle atrophy and the increased risk of pneumonia, patient inturrupted stating, " I am already sitting up, I don't need to get up". Further education given, patient then stated she would get up when her sister arrives which should be in a hour.  Emotional encouragement given, will continue to encourage ambulation and will reassess when sister arrives.   1430: patients sister at bedside, patient agreed to getting up to the chair.  1545: Patient ambulated in hallway with [place] on 3l for 250ft. VSS.  [address], [place]

## 2018-02-18 NOTE — Progress Notes (Signed)
Patient refused to get up and walk around the unit this AM. RN educated patient on the importance of walking in order for her to recover quickly; however, patient continued to refuse to get out of bed stating she felt bad and would walk later.   Brittany [name] Brittany [name], [name]

## 2018-02-18 NOTE — Care Management (Signed)
Benefit Check NEW IVABRIDINE 5 MG BID  NONE FORMULARY    2. CORLANOR  5 MG BID  COVER- YES  CO-PAY- $ 45.00  PRIOR APPROVAL- NO   PREFERRED PHARMACY : [facility] AND [facility]

## 2018-02-18 NOTE — NC FL2 (Signed)
[place] LEVEL OF CARE SCREENING TOOL     IDENTIFICATION  Patient Name: Brittany [name] Birthdate: [birth year] Sex: female Admission Date (Current Location): 02/03/2018  [facility] and [place] Number:  Herbalist and Address:  [facility] [place], [place] [address] [place], [place] [address]      Provider Number: [id number]  Attending Physician Name and Address:  Rexene Alberts, MD  Relative Name and Phone Number:       Current Level of Care: Hospital Recommended Level of Care: [place] Prior Approval Number:    Date Approved/Denied:   PASRR Number: [id number]  Discharge Plan:      Current Diagnoses: Patient Active Problem List   Diagnosis Date Noted  . Acute respiratory failure with hypoxia ([place])   . STEMI involving left anterior descending coronary artery ([place]) 02/03/2018  . Cardiogenic shock ([place]) 02/03/2018  . Coronary artery disease involving native coronary artery of native heart with unstable angina pectoris ([place]) 02/03/2018  . Acute systolic congestive heart failure ([place]) 02/03/2018  . S/P CABG (coronary artery bypass graft) 02/03/2018  . Prophylactic measure   . Hypertension   . Asthma   . Anxiety   . Allergy   . CAP (community acquired pneumonia) 03/15/2015  . Chest pain 03/15/2015  . Pneumonia 03/15/2015  . Lung cancer ([place])   . Small cell lung cancer ([place]) 03/16/2014  . Protein-calorie malnutrition, severe ([place]) 03/14/2014  . Atrial fibrillation ([place]) 03/12/2014  . SVT (supraventricular tachycardia) ([place]) 03/11/2014  . Lung mass 03/10/2014  . HTN (hypertension) 05/07/2012  . Asthma 05/07/2012    Orientation RESPIRATION BLADDER Height & Weight     Self, Time, Situation, Place  Normal Incontinent, External catheter Weight: 104 lb 8 oz (47.4 kg) Height:  5\' 1"  (154.9 cm)  BEHAVIORAL SYMPTOMS/MOOD NEUROLOGICAL BOWEL NUTRITION STATUS      Continent Diet(see DC summary)  AMBULATORY  STATUS COMMUNICATION OF NEEDS Skin   Extensive Assist Verbally                         Personal Care Assistance Level of Assistance  Bathing, Dressing Bathing Assistance: Maximum assistance   Dressing Assistance: Maximum assistance     Functional Limitations Info             SPECIAL CARE FACTORS FREQUENCY  PT (By licensed PT), OT (By licensed OT)     PT Frequency: 5/wk OT Frequency: 5/wk            Contractures      Additional Factors Info  Code Status, Allergies Code Status Info: FULL Allergies Info: Iodinated Diagnostic Agents, Latex, Pineapple, Sulfa Antibiotics, Codeine           Current Medications (02/18/2018):  This is the current hospital active medication list Current Facility-Administered Medications  Medication Dose Route Frequency Provider Last Rate Last Dose  . 0.9 %  sodium chloride infusion   Intravenous Continuous Rexene Alberts, MD   Stopped at 02/18/18 1540  . ALPRAZolam Duanne Moron) tablet 0.25 mg  0.25 mg Oral BID PRN [place] Gum, Collier Salina, MD   0.25 mg at 02/14/18 1016  . amiodarone (PACERONE) tablet 200 mg  200 mg Oral BID Bensimhon, Shaune Pascal, MD   200 mg at 02/18/18 0957  . aspirin EC tablet 81 mg  81 mg Oral Daily Rexene Alberts, MD   81 mg at 02/18/18 0957  . atorvastatin (LIPITOR) tablet 80 mg  80 mg  Oral q1800 Rexene Alberts, MD   80 mg at 02/17/18 1737  . bisacodyl (DULCOLAX) EC tablet 10 mg  10 mg Oral Daily PRN Rexene Alberts, MD       Or  . bisacodyl (DULCOLAX) suppository 10 mg  10 mg Rectal Daily PRN Rexene Alberts, MD      . cefTAZidime (FORTAZ) 1 g in sodium chloride 0.9 % 100 mL IVPB  1 g Intravenous Q8H Rexene Alberts, MD   Stopped at 02/18/18 1431  . Chlorhexidine Gluconate Cloth 2 % PADS 6 each  6 each Topical Daily Rexene Alberts, MD   6 each at 02/18/18 0600  . clopidogrel (PLAVIX) tablet 75 mg  75 mg Oral Daily Rexene Alberts, MD   75 mg at 02/18/18 0957  . digoxin (LANOXIN) tablet 0.125 mg  0.125 mg Oral Daily  Bensimhon, Shaune Pascal, MD   0.125 mg at 02/18/18 0957  . docusate sodium (COLACE) capsule 200 mg  200 mg Oral Daily PRN Rexene Alberts, MD   200 mg at 02/18/18 0957  . enoxaparin (LOVENOX) injection 40 mg  40 mg Subcutaneous Q24H Rexene Alberts, MD   40 mg at 02/18/18 0956  . feeding supplement (ENSURE ENLIVE) (ENSURE ENLIVE) liquid 237 mL  237 mL Oral TID BM Rexene Alberts, MD   237 mL at 02/18/18 1402  . furosemide (LASIX) tablet 40 mg  40 mg Oral Daily Larey [place], MD   40 mg at 02/18/18 0957  . ipratropium-albuterol (DUONEB) 0.5-2.5 (3) MG/3ML nebulizer solution 3 mL  3 mL Nebulization BID Rexene Alberts, MD   3 mL at 02/18/18 0821  . ivabradine (CORLANOR) tablet 5 mg  5 mg Oral BID WC Bensimhon, Shaune Pascal, MD   5 mg at 02/18/18 0831  . levalbuterol (XOPENEX) nebulizer solution 0.63 mg  0.63 mg Nebulization Q4H PRN Melrose Nakayama, MD   0.63 mg at 02/16/18 0151  . losartan (COZAAR) tablet 12.5 mg  12.5 mg Oral BID Larey [place], MD   12.5 mg at 02/18/18 0831  . MEDLINE mouth rinse  15 mL Mouth Rinse BID Rexene Alberts, MD   15 mL at 02/18/18 0959  . metoprolol tartrate (LOPRESSOR) injection 2.5-5 mg  2.5-5 mg Intravenous Q2H PRN Rexene Alberts, MD      . morphine 4 MG/ML injection 1-2 mg  1-2 mg Intravenous Q1H PRN Rexene Alberts, MD   2 mg at 02/11/18 2038  . ondansetron (ZOFRAN) injection 4 mg  4 mg Intravenous Q6H PRN Rexene Alberts, MD   4 mg at 02/11/18 0327  . oxyCODONE (Oxy IR/ROXICODONE) immediate release tablet 5-10 mg  5-10 mg Oral Q3H PRN Rexene Alberts, MD   10 mg at 02/17/18 1236  . pantoprazole (PROTONIX) EC tablet 40 mg  40 mg Oral Daily Rexene Alberts, MD   40 mg at 02/18/18 0957  . RESOURCE THICKENUP CLEAR   Oral PRN Bensimhon, Shaune Pascal, MD      . sodium chloride flush (NS) 0.9 % injection 10-40 mL  10-40 mL Intracatheter Q12H Rexene Alberts, MD   10 mL at 02/18/18 1003  . sodium chloride flush (NS) 0.9 % injection 10-40 mL  10-40 mL  Intracatheter PRN Rexene Alberts, MD      . spironolactone (ALDACTONE) tablet 25 mg  25 mg Oral Daily Rexene Alberts, MD   25 mg at 02/18/18 0957  . traMADol (ULTRAM) tablet  50-100 mg  50-100 mg Oral Q4H PRN Rexene Alberts, MD   100 mg at 02/15/18 1005   Facility-Administered Medications Ordered in Other Encounters  Medication Dose Route Frequency Provider Last Rate Last Dose  . etomidate (AMIDATE) injection    Anesthesia Intra-op [name], CRNA   12 mg at 02/03/18 1130  . succinylcholine (ANECTINE) injection    Anesthesia Intra-op [name], CRNA   60 mg at 02/03/18 1130     Discharge Medications: Please see discharge summary for a list of discharge medications.  Relevant Imaging Results:  Relevant Lab Results:   Additional Information SS#: [id number]  Jorge Ny, LCSW

## 2018-02-18 NOTE — Progress Notes (Signed)
Patient ID: Brittany [name], female   DOB: 08-16-1961, 57 y.o.   MRN: [id number] EVENING ROUNDS NOTE :     [address]       [place] [address]             [phone]                 10 Days Post-Op Procedure(s) (LRB): REMOVAL OF IMPELLA LEFT VENTRICULAR ASSIST DEVICE (N/A) TRANSESOPHAGEAL ECHOCARDIOGRAM (TEE) (N/A)  Total Length of Stay:  LOS: 15 days  BP 129/82   Pulse 84   Temp 97.9 F (36.6 C) (Oral)   Resp 19   Ht 5\' 1"  (1.549 m)   Wt 104 lb 8 oz (47.4 kg)   SpO2 100%   BMI 19.75 kg/m   .Intake/Output      03/10 0701 - 03/11 0700 03/11 0701 - 03/12 0700   P.O. 550 0   I.V. (mL/kg) 312 (6.6) 94.5 (2)   IV Piggyback 400 100   Total Intake(mL/kg) 1262 (26.6) 194.5 (4.1)   Urine (mL/kg/hr) 1150 (1) 770 (1.5)   Total Output 1150 770   Net +112 -575.5        Urine Occurrence 2 x      . sodium chloride Stopped (02/18/18 1540)  . cefTAZidime (FORTAZ)  IV Stopped (02/18/18 1431)     Lab Results  Component Value Date   WBC 15.5 (H) 02/18/2018   HGB 10.1 (L) 02/18/2018   HCT 31.8 (L) 02/18/2018   PLT 463 (H) 02/18/2018   GLUCOSE 162 (H) 02/18/2018   CHOL 279 (H) 01/01/2014   TRIG 109 01/01/2014   HDL 49 01/01/2014   LDLCALC 208 (H) 01/01/2014   ALT 33 02/12/2018   AST 56 (H) 02/12/2018   NA 134 (L) 02/18/2018   K 4.2 02/18/2018   CL 96 (L) 02/18/2018   CREATININE 0.82 02/18/2018   BUN 16 02/18/2018   CO2 30 02/18/2018   TSH 0.624 03/11/2014   INR 1.28 02/08/2018    Walking in unit   Grace Isaac MD  Beeper [phone] Office [phone] 02/18/2018 5:44 PM

## 2018-02-18 NOTE — Progress Notes (Signed)
[address]       [place] [address]             [phone]        CARDIOTHORACIC SURGERY PROGRESS NOTE   R10 Days Post-Op Procedure(s) (LRB): REMOVAL OF IMPELLA LEFT VENTRICULAR ASSIST DEVICE (N/A) TRANSESOPHAGEAL ECHOCARDIOGRAM (TEE) (N/A)  Subjective: Looks good.  No complaints  Objective: Vital signs: BP Readings from Last 1 Encounters:  02/18/18 121/74   Pulse Readings from Last 1 Encounters:  02/18/18 94   Resp Readings from Last 1 Encounters:  02/18/18 (!) 22   Temp Readings from Last 1 Encounters:  02/18/18 97.9 F (36.6 C) (Oral)    Hemodynamics: CVP:  [6 mmHg-7 mmHg] 6 mmHg  Mixed venous co-ox 63%   Physical Exam:  Rhythm:   sinus  Breath sounds: clear  Heart sounds:  RRR w/out murmur  Incisions:  Clean and dry  Abdomen:  Soft, non-distended, non-tender  Extremities:  Warm, well-perfused    Intake/Output from previous day: 03/10 0701 - 03/11 0700 In: 1262 [P.O.:550; I.V.:312; IV Piggyback:400] Out: 1150 [Urine:1150] Intake/Output this shift: Total I/O In: 23.6 [I.V.:23.6] Out: 50 [Urine:50]  Lab Results:  CBC: Recent Labs    02/17/18 0402 02/18/18 0300  WBC 14.3* 15.5*  HGB 10.0* 10.1*  HCT 31.1* 31.8*  PLT 424* 463*    BMET:  Recent Labs    02/17/18 0402 02/18/18 0300  NA 134* 134*  K 4.7 4.2  CL 92* 96*  CO2 33* 30  GLUCOSE 221* 162*  BUN 16 16  CREATININE 0.73 0.82  CALCIUM 8.8* 8.9     PT/INR:  No results for input(s): LABPROT, INR in the last 72 hours.  CBG (last 3)  No results for input(s): GLUCAP in the last 72 hours.  ABG    Component Value Date/Time   PHART 7.433 02/09/2018 0415   PCO2ART 41.7 02/09/2018 0415   PO2ART 72.0 (L) 02/09/2018 0415   HCO3 28.0 02/09/2018 0415   TCO2 40 (H) 02/16/2018 1435   ACIDBASEDEF 1.0 02/08/2018 2117   O2SAT 63.2 02/18/2018 0320    CXR: PORTABLE CHEST 1 VIEW  COMPARISON:  02/16/2018 and earlier.  FINDINGS: Portable AP semi upright  view at 0615 hours. Left PICC line remains in place. Epicardial pacer wires remain placed. Stable cardiac size and mediastinal contours. Stable large lung volumes. No pneumothorax, pleural effusion or confluent pulmonary opacity. Regression of bilateral pulmonary vascular congestion and/or interstitial opacity since 02/12/2018. Mild further improvement since 02/16/2018. No areas of worsening ventilation.  IMPRESSION: 1. Regression of interstitial edema and/or pneumonia since 02/12/2018. Mild further improvement in ventilation since yesterday. 2. Chronic cardiomegaly and pulmonary hyperinflation. No new cardiopulmonary abnormality.   Electronically Signed   By: Genevie Ann M.D.   On: 02/18/2018 07:50   Assessment/Plan: S/P Procedure(s) (LRB): REMOVAL OF IMPELLA LEFT VENTRICULAR ASSIST DEVICE (N/A) TRANSESOPHAGEAL ECHOCARDIOGRAM (TEE) (N/A)  Clinically stableand slowly improvingbut remains very tenuous due to acute systolic CHF Maintaining NSR - sinus tach on milrinone 0.125, CVP6 Improving oxygenation w/ CXR demonstrating mild CHF, underlying chronic lung disease Leukocytosis w/out fever, trending down, on empiric ABx for possible HCAP and Pseudomonas UTI Expected post op acute blood loss anemia,stable   Continue Fortaz for total 10 day course  Otherwise Rx per advanced heart failure team  Possible transfer to step down soon  Will need case managers to look into options for care post hospital d/c   Rexene Alberts, MD 02/18/2018 9:15  AM

## 2018-02-18 NOTE — Progress Notes (Addendum)
Patient ID: Brittany [name], female   DOB: 1961-07-06, 57 y.o.   MRN: [id number]      Advanced Heart Failure Rounding Note  PCP-Cardiologist: No primary care provider on file.   Subjective:    Events S/p emergent CABG 02/03/18 IABP removed 02/04/18 Impella out 02/08/18 Extubated 02/08/18  Coox 63.2% this am on mirlinone 0.125 mcg/kg/min. CVP 5-6  Fatigued a lot yesterday. Has already walked halls this am. 1 lap with mild lightheadedness. No SOB.   ECHO 3/4 EF 25-30%. Grade II DD. Moderate-severe aortic insufficiency.   TEE 02/03/18 LVEF 20%, Mild to Mod AR, Mild/Mod MR, RV normal.   Objective:   Weight Range: 104 lb 8 oz (47.4 kg) Body mass index is 19.75 kg/m.   Vital Signs:   Temp:  [98.3 F (36.8 C)-99.1 F (37.3 C)] 98.3 F (36.8 C) (03/11 0405) Pulse Rate:  [78-89] 89 (03/11 0830) Resp:  [15-23] 23 (03/11 0830) BP: (103-127)/(56-78) 127/72 (03/11 0830) SpO2:  [95 %-100 %] 99 % (03/11 0830) FiO2 (%):  [40 %] 40 % (03/11 0000) Weight:  [104 lb 8 oz (47.4 kg)] 104 lb 8 oz (47.4 kg) (03/11 0500) Last BM Date: 02/15/18  Weight change: Filed Weights   02/16/18 0500 02/17/18 0600 02/18/18 0500  Weight: 106 lb 12.8 oz (48.4 kg) 104 lb 4.8 oz (47.3 kg) 104 lb 8 oz (47.4 kg)    Intake/Output:   Intake/Output Summary (Last 24 hours) at 02/18/2018 0831 Last data filed at 02/18/2018 0600 Gross per 24 hour  Intake 1250.16 ml  Output 1150 ml  Net 100.16 ml    Physical Exam   CVP 5-6  General: fatigued. NAD.  HEENT: Normal Neck: Supple. JVP not elevated. Carotids 2+ bilat; no bruits. No thyromegaly or nodule noted. Cor: PMI nondisplaced. RRR, 1/6 SEM RUSB Lungs: CTAB, normal effort. Abdomen: Soft, non-tender, non-distended, no HSM. No bruits or masses. +BS  Extremities: No cyanosis, clubbing, or rash. R and LLE no edema.  Neuro: Alert & orientedx3, cranial nerves grossly intact. moves all 4 extremities w/o difficulty. Affect pleasant   Telemetry   NSR  90s, personally reviewed.   Labs    CBC Recent Labs    02/17/18 0402 02/18/18 0300  WBC 14.3* 15.5*  HGB 10.0* 10.1*  HCT 31.1* 31.8*  MCV 94.0 93.5  PLT 424* 664*   Basic Metabolic Panel Recent Labs    02/17/18 0402 02/18/18 0300  NA 134* 134*  K 4.7 4.2  CL 92* 96*  CO2 33* 30  GLUCOSE 221* 162*  BUN 16 16  CREATININE 0.73 0.82  CALCIUM 8.8* 8.9  MG 1.6*  --    Liver Function Tests No results for input(s): AST, ALT, ALKPHOS, BILITOT, PROT, ALBUMIN in the last 72 hours. No results for input(s): LIPASE, AMYLASE in the last 72 hours. Cardiac Enzymes No results for input(s): CKTOTAL, CKMB, CKMBINDEX, TROPONINI in the last 72 hours.  BNP: BNP (last 3 results) Recent Labs    02/03/18 0853 02/15/18 0449  BNP 3,127.4* 2,526.7*    ProBNP (last 3 results) No results for input(s): PROBNP in the last 8760 hours.   D-Dimer No results for input(s): DDIMER in the last 72 hours. Hemoglobin A1C No results for input(s): HGBA1C in the last 72 hours. Fasting Lipid Panel No results for input(s): CHOL, HDL, LDLCALC, TRIG, CHOLHDL, LDLDIRECT in the last 72 hours. Thyroid Function Tests No results for input(s): TSH, T4TOTAL, T3FREE, THYROIDAB in the last 72 hours.  Invalid input(s): FREET3  Other results:  Imaging    Dg Chest Port 1 View  Result Date: 02/18/2018 CLINICAL DATA:  57 year old female postoperative day 10 status post removal of left ventricular Impella assist device, and postoperative day 15 status post CABG. EXAM: PORTABLE CHEST 1 VIEW COMPARISON:  02/16/2018 and earlier. FINDINGS: Portable AP semi upright view at 0615 hours. Left PICC line remains in place. Epicardial pacer wires remain placed. Stable cardiac size and mediastinal contours. Stable large lung volumes. No pneumothorax, pleural effusion or confluent pulmonary opacity. Regression of bilateral pulmonary vascular congestion and/or interstitial opacity since 02/12/2018. Mild further improvement  since 02/16/2018. No areas of worsening ventilation. IMPRESSION: 1. Regression of interstitial edema and/or pneumonia since 02/12/2018. Mild further improvement in ventilation since yesterday. 2. Chronic cardiomegaly and pulmonary hyperinflation. No new cardiopulmonary abnormality. Electronically Signed   By: Genevie Ann M.D.   On: 02/18/2018 07:50     Medications:     Scheduled Medications: . amiodarone  200 mg Oral BID  . aspirin EC  81 mg Oral Daily  . atorvastatin  80 mg Oral q1800  . Chlorhexidine Gluconate Cloth  6 each Topical Daily  . clopidogrel  75 mg Oral Daily  . digoxin  0.125 mg Oral Daily  . enoxaparin (LOVENOX) injection  40 mg Subcutaneous Q24H  . feeding supplement (ENSURE ENLIVE)  237 mL Oral TID BM  . furosemide  40 mg Oral Daily  . ipratropium-albuterol  3 mL Nebulization BID  . ivabradine  5 mg Oral BID WC  . losartan  12.5 mg Oral BID  . mouth rinse  15 mL Mouth Rinse BID  . pantoprazole  40 mg Oral Daily  . potassium chloride  40 mEq Oral Once  . sodium chloride flush  10-40 mL Intracatheter Q12H  . spironolactone  25 mg Oral Daily    Infusions: . sodium chloride 10 mL/hr at 02/18/18 0600  . cefTAZidime (FORTAZ)  IV Stopped (02/18/18 0550)  . milrinone 0.125 mcg/kg/min (02/18/18 0600)    PRN Medications: ALPRAZolam, bisacodyl **OR** bisacodyl, docusate sodium, levalbuterol, metoprolol tartrate, morphine injection, ondansetron (ZOFRAN) IV, oxyCODONE, RESOURCE THICKENUP CLEAR, sodium chloride flush, traMADol  Patient Profile   Brittany [name] is a 57 y/o woman with a history of PAF, preveious smoker quit 4 years ago, hypertension, 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 She was admitted with NSTEMI and shock.  Emergent cath 02/03/18 showed LAD 100% stenosed, LCx 95% stenosed. Taken for emergent CABG 02/03/18  Assessment/Plan   1. NSTEMI/STEMI s/p  Emergent CABG 02/03/18: New LBBB on ECG.  Cath with severe 2v CAD as above with severe LV dysfunction/ICM. s/p Emergent CABG 02/03/18.   - No s/s of ischemia.   - On aspirin, plavix and atorvastatin.  2. Acute systolic HF -> cardiogenic shock in setting of #1: TEE 02/03/18 LVEF 20%, Mild to Mod AR, Mild/Mod MR, RV normal. Impella out 3/1.  Echo 3/4 with moderate-severe aortic insufficiency which is likely contributing to CHF.  - CVP 5-6. Continue lasix 40 mg daily  - Coox 63.2% this am on milrinone 0.125 mcg/kg/min. Stop this am and follow coox.  - Continue digoxin 0.125 mg daily - Continue spironolactone 25 mg daily.  - Continue ivabradine 5 mg BID. - Continue losartan 12.5 mg BID. - Reinforced fluid restriction to < 2 L daily, sodium restriction to less than 2000 mg daily, and the importance of daily weights.    3. Atrial fibrillation: Paroxysmal, post-op.  Now in NSR.  CHA2DS2/VASc is at least 4. (CHF, Vasc disease, HTN, Female).   - Continue po amiodarone 200 gm BID.  - Currently not anticoagulated, will discuss with team (she is on ASA and Plavix).  4. 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 Will need f/u with oncology (Dr. Julien Nordmann) as outpatient.  - Will need chest CT prior to d/c. No change.  5. Anemia: -  Hgb 10.1 today.  6. Aortic insufficiency: Moderate-severe on postop echo.  Likely potentiating CHF.   - Added afterload reduction with ARB. No change.  7. ID: Covering for PNA with vancomycin/ceftazidime. - Vanc stopped yesterday.  - Psuedomonas in urine. Stop date of ceftaz 02/20/18.  Continues to slowly improve.  Stop milrinone this am and follow coox and BP.    Length of Stay: [place], [place]  02/18/2018, 8:31 AM  Advanced Heart Failure Team Pager [phone] (M-F; 7a - 4p)  Please contact [facility] for night-coverage after hours (4p -7a ) and weekends on [email or web address]  Patient seen with PA, agree with the above note.  Good co-ox today, CVP  6.  She is doing well overall.    On exam, she is not volume overloaded.  Very mild systolic ejection murmur.   Will stop milrinone today.  Continue po Lasix.   With paroxysmal atrial fibrillation, would ideally be anticoagulated.  Would like to stop Plavix and start Eliquis, will discuss with Dr. Roxy Manns.   Loralie Champagne 02/18/2018 9:07 AM

## 2018-02-18 NOTE — Care Management Note (Signed)
  Case Management Note  Patient Details  Name: Brittany [name] MRN: [id number] Date of Birth: 1961-02-24  Subjective/Objective: Pt is now s/p CABG and s/p removal of both Balloon pump and Impella                   Action/Plan:   PTA independent from home - pt's adult son stays in the home with her.  Pt has PCP and denied barriers to obtaining/medications.  Pt plans on staying with family in [place] for approximately 3 weeks at discharge.  Pt still requiring BIPAP but will need PT eval once stable.   Expected Discharge Date:                  Expected Discharge Plan:     In-House Referral:     Discharge planning Services  CM Consult  Post Acute Care Choice:    Choice offered to:     DME Arranged:    DME Agency:     HH Arranged:    HH Agency:     Status of Service:     If discussed at [employer], dates discussed:    Additional Comments: 02/18/2018  [facility] request for pt to discharge to local SNF to ensure continuation of care.  CM completed [facility] Copay Card online at bedside based on information provided by pt.  CM provided temporary card to bedside nurse and requested that paper be transported with pt at discharge.  CM also informed pt that paperwork will be given to her at discharge.  CSW following for placement  02/15/18 Pts resp status remains tenous - remains on BIPAP or HFNC.  Undergoing VAD work up [name], RN 02/18/2018, 4:10 PM

## 2018-02-19 ENCOUNTER — Inpatient Hospital Stay (HOSPITAL_COMMUNITY): Payer: BLUE CROSS/BLUE SHIELD

## 2018-02-19 LAB — CBC
HCT: 31 % — ABNORMAL LOW (ref 36.0–46.0)
HEMOGLOBIN: 9.9 g/dL — AB (ref 12.0–15.0)
MCH: 29.9 pg (ref 26.0–34.0)
MCHC: 31.9 g/dL (ref 30.0–36.0)
MCV: 93.7 fL (ref 78.0–100.0)
Platelets: 432 10*3/uL — ABNORMAL HIGH (ref 150–400)
RBC: 3.31 MIL/uL — ABNORMAL LOW (ref 3.87–5.11)
RDW: 16.5 % — AB (ref 11.5–15.5)
WBC: 14.5 10*3/uL — ABNORMAL HIGH (ref 4.0–10.5)

## 2018-02-19 LAB — BASIC METABOLIC PANEL
ANION GAP: 9 (ref 5–15)
BUN: 16 mg/dL (ref 6–20)
CO2: 29 mmol/L (ref 22–32)
Calcium: 8.8 mg/dL — ABNORMAL LOW (ref 8.9–10.3)
Chloride: 98 mmol/L — ABNORMAL LOW (ref 101–111)
Creatinine, Ser: 0.8 mg/dL (ref 0.44–1.00)
GFR calc Af Amer: >60 mL/min (ref >60)
GFR calc non Af Amer: >60 mL/min (ref >60)
GLUCOSE: 200 mg/dL — AB (ref 65–99)
Potassium: 3.9 mmol/L (ref 3.5–5.1)
Sodium: 136 mmol/L (ref 135–145)

## 2018-02-19 LAB — COOXEMETRY PANEL
Carboxyhemoglobin: 1.9 % — ABNORMAL HIGH (ref 0.5–1.5)
METHEMOGLOBIN: 0.9 % (ref 0.0–1.5)
O2 SAT: 66 %
TOTAL HEMOGLOBIN: 11.4 g/dL — AB (ref 12.0–16.0)

## 2018-02-19 MED ORDER — LOSARTAN POTASSIUM 25 MG PO TABS
25.0000 mg | ORAL_TABLET | Freq: Two times a day (BID) | ORAL | Status: DC
  Administered 2018-02-19 – 2018-02-20 (×2): 25 mg via ORAL
  Filled 2018-02-19 (×2): qty 1

## 2018-02-19 MED ORDER — MOVING RIGHT ALONG BOOK
Freq: Once | Status: AC
  Administered 2018-02-19: 16:00:00
  Filled 2018-02-19: qty 1

## 2018-02-19 NOTE — Progress Notes (Signed)
      [address]       [place] [address]             [phone]        CARDIOTHORACIC SURGERY PROGRESS NOTE   R11 Days Post-Op Procedure(s) (LRB): REMOVAL OF IMPELLA LEFT VENTRICULAR ASSIST DEVICE (N/A) TRANSESOPHAGEAL ECHOCARDIOGRAM (TEE) (N/A)  Subjective: No complaints but reportedly refused PT today and less cooperative with staff  Objective: Vital signs: BP Readings from Last 1 Encounters:  02/19/18 119/70   Pulse Readings from Last 1 Encounters:  02/19/18 80   Resp Readings from Last 1 Encounters:  02/19/18 17   Temp Readings from Last 1 Encounters:  02/19/18 98.9 F (37.2 C) (Oral)    Hemodynamics: CVP:  [5 mmHg-6 mmHg] 5 mmHg  Mixed venous co-ox 66%   Physical Exam:  Rhythm:   sinus  Breath sounds: clear  Heart sounds:  RRR w/out murmur  Incisions:  Clean and dry  Abdomen:  Soft, non-distended, non-tender  Extremities:  Warm, well-perfused    Intake/Output from previous day: 03/11 0701 - 03/12 0700 In: 394.5 [P.O.:10; I.V.:84.5; IV Piggyback:300] Out: 970 [Urine:970] Intake/Output this shift: Total I/O In: 1050 [IV Piggyback:1050] Out: 1 [Stool:1]  Lab Results:  CBC: Recent Labs    02/18/18 0300 02/19/18 0347  WBC 15.5* 14.5*  HGB 10.1* 9.9*  HCT 31.8* 31.0*  PLT 463* 432*    BMET:  Recent Labs    02/18/18 0300 02/19/18 0347  NA 134* 136  K 4.2 3.9  CL 96* 98*  CO2 30 29  GLUCOSE 162* 200*  BUN 16 16  CREATININE 0.82 0.80  CALCIUM 8.9 8.8*     PT/INR:  No results for input(s): LABPROT, INR in the last 72 hours.  CBG (last 3)  No results for input(s): GLUCAP in the last 72 hours.  ABG    Component Value Date/Time   PHART 7.433 02/09/2018 0415   PCO2ART 41.7 02/09/2018 0415   PO2ART 72.0 (L) 02/09/2018 0415   HCO3 28.0 02/09/2018 0415   TCO2 40 (H) 02/16/2018 1435   ACIDBASEDEF 1.0 02/08/2018 2117   O2SAT 66.0 02/19/2018 0345    CXR: n/a  Assessment/Plan: S/P Procedure(s) (LRB): REMOVAL  OF IMPELLA LEFT VENTRICULAR ASSIST DEVICE (N/A) TRANSESOPHAGEAL ECHOCARDIOGRAM (TEE) (N/A)  Clinically stableand slowly improvingbut remains very tenuous due to acute systolic CHF Maintaining NSR w/ stable BP off milrinone, coox 66% Improving oxygenation w/ CXR demonstrating mild CHF,underlying chronic lung disease Leukocytosis w/out fever, trending down, on empiric ABx for possible HCAP and Pseudomonas UTI Expected post op acute blood loss anemia,stable   Continue Fortaz for total 10 day course  Otherwise Rx per advanced heart failure team  Encourage mobility, PT  Transfer step down  Will need case managers to look into options for care post hospital d/c   Rexene Alberts, MD 02/19/2018 2:04 PM

## 2018-02-19 NOTE — Plan of Care (Signed)
  Education: Knowledge of General Education information will improve 02/19/2018 1715 - Progressing by Sallee Provencal, RN   Health Behavior/Discharge Planning: Ability to manage health-related needs will improve 02/19/2018 1715 - Progressing by Sallee Provencal, RN   Pain Managment: General experience of comfort will improve 02/19/2018 1715 - Completed/Met by Sallee Provencal, RN

## 2018-02-19 NOTE — Progress Notes (Signed)
Patoient currently on 3LHFNC with sats of 100%. BIPAP is at bedside on standby but not needed at this time. Patient states she will let this RT or RN know when she is ready to be put on.

## 2018-02-19 NOTE — Progress Notes (Signed)
AV Pacing Wires removed per protocol. Chest tube sutures also removed.Patient instructed to stay in bed for 1 hour. Verbalized understanding.

## 2018-02-19 NOTE — Progress Notes (Addendum)
Patient ID: Brittany [name], female   DOB: 11-08-1961, 57 y.o.   MRN: [id number]      Advanced Heart Failure Rounding Note  PCP-Cardiologist: No primary care provider on file.   Subjective:    Events S/p emergent CABG 02/03/18 IABP removed 02/04/18 Impella out 02/08/18 Extubated 02/08/18  Coox 66% this am off milrinone. CVP 6-7  Feeling OK this am. Glad to be off IV milrinone. States she is willing to go to SNF.  Less cooperative with PT today. Says she walked twice before.   ECHO 3/4 EF 25-30%. Grade II DD. Moderate-severe aortic insufficiency.   TEE 02/03/18 LVEF 20%, Mild to Mod AR, Mild/Mod MR, RV normal.   Objective:   Weight Range: 103 lb 3.2 oz (46.8 kg) Body mass index is 19.5 kg/m.   Vital Signs:   Temp:  [97.9 F (36.6 C)-98.9 F (37.2 C)] 98.7 F (37.1 C) (03/12 0332) Pulse Rate:  [77-94] 85 (03/12 0700) Resp:  [12-26] 18 (03/12 0700) BP: (95-139)/(57-90) 118/74 (03/12 0700) SpO2:  [95 %-100 %] 100 % (03/12 0700) FiO2 (%):  [40 %] 40 % (03/12 0400) Weight:  [103 lb 3.2 oz (46.8 kg)] 103 lb 3.2 oz (46.8 kg) (03/12 0500) Last BM Date: 02/18/18  Weight change: Filed Weights   02/17/18 0600 02/18/18 0500 02/19/18 0500  Weight: 104 lb 4.8 oz (47.3 kg) 104 lb 8 oz (47.4 kg) 103 lb 3.2 oz (46.8 kg)    Intake/Output:   Intake/Output Summary (Last 24 hours) at 02/19/2018 0755 Last data filed at 02/19/2018 0600 Gross per 24 hour  Intake 394.5 ml  Output 970 ml  Net -575.5 ml    Physical Exam   CVP 6-7  General: Fatigued. NAD.  HEENT: Normal Neck: Supple. JVP 6-7 cm. Carotids 2+ bilat; no bruits. No thyromegaly or nodule noted. Cor: PMI nondisplaced. RRR, 1/6 SEM RUSB.  Lungs: CTAB, normal effort.Decreased BS throughout.  Abdomen: Soft, non-tender, non-distended, no HSM. No bruits or masses. +BS  Extremities: No cyanosis, clubbing, or rash. R and LLE no edema.  Neuro: Alert & orientedx3, cranial nerves grossly intact. moves all 4 extremities w/o  difficulty. Affect pleasant   Telemetry   NSR 80-90s, personally reviewed.   Labs    CBC Recent Labs    02/18/18 0300 02/19/18 0347  WBC 15.5* 14.5*  HGB 10.1* 9.9*  HCT 31.8* 31.0*  MCV 93.5 93.7  PLT 463* 124*   Basic Metabolic Panel Recent Labs    02/17/18 0402 02/18/18 0300 02/19/18 0347  Brittany 134* 134* 136  K 4.7 4.2 3.9  CL 92* 96* 98*  CO2 33* 30 29  GLUCOSE 221* 162* 200*  BUN 16 16 16   CREATININE 0.73 0.82 0.80  CALCIUM 8.8* 8.9 8.8*  MG 1.6* 2.2  --    Liver Function Tests No results for input(s): AST, ALT, ALKPHOS, BILITOT, PROT, ALBUMIN in the last 72 hours. No results for input(s): LIPASE, AMYLASE in the last 72 hours. Cardiac Enzymes No results for input(s): CKTOTAL, CKMB, CKMBINDEX, TROPONINI in the last 72 hours.  BNP: BNP (last 3 results) Recent Labs    02/03/18 0853 02/15/18 0449  BNP 3,127.4* 2,526.7*    ProBNP (last 3 results) No results for input(s): PROBNP in the last 8760 hours.   D-Dimer No results for input(s): DDIMER in the last 72 hours. Hemoglobin A1C No results for input(s): HGBA1C in the last 72 hours. Fasting Lipid Panel No results for input(s): CHOL, HDL, LDLCALC, TRIG, CHOLHDL, LDLDIRECT in the last  72 hours. Thyroid Function Tests No results for input(s): TSH, T4TOTAL, T3FREE, THYROIDAB in the last 72 hours.  Invalid input(s): FREET3  Other results:   Imaging    No results found.   Medications:     Scheduled Medications: . amiodarone  200 mg Oral BID  . aspirin EC  81 mg Oral Daily  . atorvastatin  80 mg Oral q1800  . Chlorhexidine Gluconate Cloth  6 each Topical Daily  . clopidogrel  75 mg Oral Daily  . digoxin  0.125 mg Oral Daily  . enoxaparin (LOVENOX) injection  40 mg Subcutaneous Q24H  . feeding supplement (ENSURE ENLIVE)  237 mL Oral TID BM  . furosemide  40 mg Oral Daily  . ipratropium-albuterol  3 mL Nebulization BID  . ivabradine  5 mg Oral BID WC  . losartan  12.5 mg Oral BID  . mouth  rinse  15 mL Mouth Rinse BID  . pantoprazole  40 mg Oral Daily  . sodium chloride flush  10-40 mL Intracatheter Q12H  . spironolactone  25 mg Oral Daily    Infusions: . sodium chloride Stopped (02/18/18 1540)  . 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 She was admitted with NSTEMI and shock.  Emergent cath 02/03/18 showed LAD 100% stenosed, LCx 95% stenosed. Taken for emergent CABG 02/03/18  Assessment/Plan   1. 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  Cath with severe 2v CAD as above with severe LV dysfunction/ICM. s/p Emergent CABG 02/03/18.   - Denies CP.  - On aspirin, plavix and atorvastatin.  2. Acute systolic HF -> cardiogenic shock in setting of #1: TEE 02/03/18 LVEF 20%, Mild to Mod AR, Mild/Mod MR, RV normal. Impella out 3/1.  Echo 3/4 with moderate-severe aortic insufficiency which is likely contributing to CHF.  - CVP 6-7. Continue lasix 40 mg daily  - Coox 66% off milrinone.  - Continue digoxin 0.125 mg daily - Continue spironolactone 25 mg daily.  - Continue ivabradine 5 mg BID. - Increase losartan 25 mg BID.  3. Atrial fibrillation: Paroxysmal.  Now in NSR.  CHA2DS2/VASc is at least 4. (CHF, Vasc disease, HTN, Female).   - She has history of Afib RVR in the past in the setting of her Lung CA. Previously on Xarelto but stopped her medications. Dr. Roxy Manns OK with stopping plavix and starting Eliquis if decided on.  - Continue po amiodarone 200 gm BID.  -  Currently not anticoagulated, will discuss with team (she is on ASA and Plavix).  4. 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 Will need f/u with oncology (Dr. Julien Nordmann) as outpatient.  - Will need chest CT prior to d/c. No change. 5. Anemia:   Hgb 9.9 today.  6. Aortic insufficiency: Moderate-severe on postop echo.  Likely potentiating CHF.   - Added afterload reduction with ARB. No change.  7. ID: Covering for PNA with vancomycin/ceftazidime. - Vanc stopped 02/17/18. - Psuedomonas in urine. Stop date of ceftaz 02/20/18. - WBC remains elevated. Encouraged IS.   Continues to improve. Now off inotrope support.  Will discuss AC with MD. Can likely pull PICC and consider moving to stepdown.   Length of Stay: [place], [place]  02/19/2018, 7:55 AM  Advanced Heart Failure Team Pager [phone] (M-F; 7a - 4p)  Please contact [facility] for night-coverage after hours (4p -7a ) and weekends on [email or web address]  Patient seen and examined with the above-signed Advanced Practice Provider and/or Housestaff. I personally reviewed laboratory data, imaging studies and relevant notes. I independently examined the patient and formulated the important aspects of the plan. I have edited the note to reflect any of my changes or salient points. I have personally discussed the plan with the patient and/or family.  HF stable off milrinone. Volume status looks good. Ambulating unit. Maintaining NSR. CT chest reviewed and shows interval development of mass-like consolidation in R hilum. Will need to review with TCTS given h/o previous SCLCA. Can go to SDU.   Glori Bickers, MD  5:06 PM

## 2018-02-19 NOTE — Progress Notes (Signed)
  Speech Language Pathology Treatment: Dysphagia  Patient Details Name: Brittany [name] MRN: [id number] DOB: [birth year] Today's Date: 02/19/2018 Time: 7353-2992 SLP Time Calculation (min) (ACUTE ONLY): 20 min  Assessment / Plan / Recommendation Clinical Impression  Pt seen for education re: dysphagia, use of expiratory muscle strength training and implementation of the water protocol. Pt states she does not mind the pre thickened OJ and V-8 juice. Performed 5 then 10 trials of EMT without excessive effort. Encouraged her to continue.  Introduced the water protocol in which known aspirators are allowed small amounts of thin water under certain guidelines including 1) water following oral care, 2) not during meals or with meds 3) full supervision with water. Immediate throat clears following oral care and sips thin which is expected. Educated Therapist, sports. Pt pleased to have thin water. SLP will monitor/observe closely.    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 She was admitted with NSTEMI and shock, s/p emergent CABG on 02/03/18. Intubated 02/03/18 to 02/08/18.       SLP Plan  Continue with current plan of care       Recommendations  Diet recommendations: Dysphagia 3 (mechanical soft);Nectar-thick liquid(water protocol) Liquids provided via: Cup Medication Administration: Whole meds with puree Supervision: Patient able to self feed;Intermittent supervision to cue for compensatory strategies Compensations: Small sips/bites;Slow rate;Clear throat intermittently Postural Changes and/or Swallow Maneuvers: Seated upright 90 degrees                Oral Care Recommendations: Oral care BID Follow up Recommendations: None SLP Visit Diagnosis: Dysphagia, pharyngeal phase  (R13.13) Plan: Continue with current plan of care                       [name] 02/19/2018, 4:25 PM [name].Ed [employer] [phone]

## 2018-02-19 NOTE — Evaluation (Signed)
Physical Therapy Evaluation Patient Details Name: Brittany [name] MRN: [id number] DOB: 1961-02-06 Today's Date: 02/19/2018   History of Present Illness  Pt is a 57 y.o. female s/p emergent CABGx3 on 02/03/18. IABP removed 2/25. Impella removed 3/1. Intubated 2/24-3/1. PMH includes small cell lung cancer (s/p treatment 2015), HTN, PAF, CHF, asthma, anxiety.    Clinical Impression  Pt presents with an overall decrease in functional mobility secondary to above. PTA, pt indep with all mobility; reports she does not have family available for 24/7 support at d/c. Pt currently very frustrated by current condition. Willing to perform bed mobility, but adamantly declining OOB mobility with PT today due to pain and fatigue. Educ on precautions, positioning, and importance of mobility. Of note, has been ambulating some with RN/NT. Pt may benefit from SNF-level therapies to maximize functional mobility and independence prior to return home; will follow closely to further assess d/c needs as pt progresses and address established goals.    Follow Up Recommendations SNF(or HHPT pending pt progress)    Equipment Recommendations  (TBD)    Recommendations for Other Services OT consult     Precautions / Restrictions Precautions Precautions: Fall;Sternal Precaution Comments: Verbally reviewed precautions (pt not receptive today, will need to review again with handout) Restrictions Weight Bearing Restrictions: Yes(sternal precautions ) Other Position/Activity Restrictions: Sternal      Mobility  Bed Mobility Overal bed mobility: Needs Assistance             General bed mobility comments: MaxA to scoot up in bed with <25% assist from BLEs. Adamantly declining OOB mobility with PT today stating, "I've been up all the time with nursing." States PT can check in tomorrow  Transfers                    Ambulation/Gait                Stairs            Wheelchair  Mobility    Modified Rankin (Stroke Patients Only)       Balance                                             Pertinent Vitals/Pain Pain Assessment: 0-10 Pain Score: 6  Pain Location: Stomach Pain Descriptors / Indicators: Cramping Pain Intervention(s): Limited activity within patient's tolerance;Patient requesting pain meds-RN notified    Home Living Family/patient expects to be discharged to:: Private residence Living Arrangements: Spouse/significant other Available Help at Discharge: Family;Available PRN/intermittently Type of Home: House         Home Equipment: None Additional Comments: Does not have family available for 24/7 support if needed    Prior Function Level of Independence: Independent               Hand Dominance        Extremity/Trunk Assessment   Upper Extremity Assessment Upper Extremity Assessment: Generalized weakness(sternal precautions)    Lower Extremity Assessment Lower Extremity Assessment: Generalized weakness       Communication   Communication: Other (comment)(Very soft-spoken)  Cognition Arousal/Alertness: Awake/alert Behavior During Therapy: Agitated Overall Cognitive Status: Within Functional Limits for tasks assessed                                 General Comments: Pt  appropriately interactive and answering questions, but agitated regarding current condition and the fact that PT wants her to get OOB. Unwilling to do so today      General Comments      Exercises     Assessment/Plan    PT Assessment Patient needs continued PT services  PT Problem List Decreased strength;Decreased activity tolerance;Decreased balance;Decreased mobility;Decreased knowledge of use of DME;Cardiopulmonary status limiting activity;Pain       PT Treatment Interventions DME instruction;Gait training;Stair training;Functional mobility training;Therapeutic activities;Therapeutic exercise;Balance  training;Patient/family education    PT Goals (Current goals can be found in the Care Plan section)  Acute Rehab PT Goals Patient Stated Goal: Be able to do something for myself PT Goal Formulation: With patient Time For Goal Achievement: 03/05/18 Potential to Achieve Goals: Good    Frequency Min 3X/week   Barriers to discharge Decreased caregiver support      Co-evaluation               AM-PAC PT "6 Clicks" Daily Activity  Outcome Measure Difficulty turning over in bed (including adjusting bedclothes, sheets and blankets)?: Unable Difficulty moving from lying on back to sitting on the side of the bed? : Unable Difficulty sitting down on and standing up from a chair with arms (e.g., wheelchair, bedside commode, etc,.)?: A Little Help needed moving to and from a bed to chair (including a wheelchair)?: A Little Help needed walking in hospital room?: A Little Help needed climbing 3-5 steps with a railing? : A Lot 6 Click Score: 13    End of Session   Activity Tolerance: Patient limited by pain Patient left: in bed;with call bell/phone within reach;with nursing/sitter in room Nurse Communication: Mobility status PT Visit Diagnosis: Other abnormalities of gait and mobility (R26.89);Muscle weakness (generalized) (M62.81)    Time: 4734-0370 PT Time Calculation (min) (ACUTE ONLY): 13 min   Charges:   PT Evaluation $PT Eval Moderate Complexity: 1 Mod     PT G Codes:       Mabeline Caras, PT, DPT Acute Rehab Services  Pager: [place] 02/19/2018, 11:12 AM

## 2018-02-19 NOTE — Progress Notes (Signed)
CT surgery p.m. Rounds  Patient examined and record reviewed.Hemodynamics stable,labs satisfactory.Patient had stable day.Continue current care. Brittany [name] 02/19/2018

## 2018-02-19 NOTE — Progress Notes (Signed)
Nutrition Follow-up  DOCUMENTATION CODES:   Severe malnutrition in context of acute illness/injury  INTERVENTION:   -Ensure Enlive po TID, each supplement provides 350 kcal and 20 grams of protein  -Magic cup TID with meals, each supplement provides 290 kcal and 9 grams of protein  -Add snacks between meals   NUTRITION DIAGNOSIS:   Severe Malnutrition related to acute illness(emergent CABG, heart failure, respiratory failure requiring vent/BiPap) as evidenced by energy intake < or equal to 50% for > or equal to 5 days, moderate muscle depletion, mild fat depletion.  Continues  GOAL:   Patient will meet greater than or equal to 90% of their needs  Not met  MONITOR:   PO intake, Supplement acceptance, Diet advancement, Labs, Weight trends  REASON FOR ASSESSMENT:   Ventilator    ASSESSMENT:   Pt with PMH of small cell lung cancer s/p chemotherapy and radiation (2016), HTN, and CHF presents with cardiogenic shock in setting of STEMI. Pt required emergent CABG 02/03/2018 with Impella placement. Pt remains intubated post-operatively.   Pt remains on Dysphagia III, Nectar Thick diet. Recorded po intake 5-15% of meals; pt reports no appetite. Pt reports she does not like the thickened liquids and because of that is not drinking fluids. Pt reports she loves Ensure Enlive but does not like when it is thickened. SLP following and they continue to re-evaluate for possible diet advancement  Weight continues to trend down. 103 pounds today. Pt reports UBW prior to admission was 123 pounds. Pt reports this was her "weight without fluid on." Net postive per I/O flow sheet but noted some urine occurrences not measure in mL  Labs: reviewed Meds: lasix  Diet Order:  DIET DYS 3 Room service appropriate? Yes with Assist; Fluid consistency: Nectar Thick  EDUCATION NEEDS:   Not appropriate for education at this time  Skin:  Skin Assessment: Skin Integrity Issues: Skin Integrity  Issues:: Incisions Incisions: chest and R leg  Last BM:  3/11  Height:   Ht Readings from Last 1 Encounters:  02/06/18 _0  (1.549 m)    Weight:   Wt Readings from Last 1 Encounters:  02/19/18 103 lb 3.2 oz (46.8 kg)    Ideal Body Weight:  47.7 kg  BMI:  Body mass index is 19.5 kg/m.  Estimated Nutritional Needs:   Kcal:  1500-1700 kcals  Protein:  75-85  Fluid:  >/= 1.5 L/d   Kerman Passey MS, RD, LDN, CNSC [phone] Pager  [phone] Weekend/On-Call Pager

## 2018-02-20 ENCOUNTER — Inpatient Hospital Stay (HOSPITAL_COMMUNITY): Payer: BLUE CROSS/BLUE SHIELD

## 2018-02-20 LAB — BASIC METABOLIC PANEL
ANION GAP: 7 (ref 5–15)
BUN: 17 mg/dL (ref 6–20)
CHLORIDE: 101 mmol/L (ref 101–111)
CO2: 28 mmol/L (ref 22–32)
Calcium: 9.1 mg/dL (ref 8.9–10.3)
Creatinine, Ser: 0.82 mg/dL (ref 0.44–1.00)
GFR calc non Af Amer: >60 mL/min (ref >60)
Glucose, Bld: 156 mg/dL — ABNORMAL HIGH (ref 65–99)
Potassium: 4.7 mmol/L (ref 3.5–5.1)
SODIUM: 136 mmol/L (ref 135–145)

## 2018-02-20 LAB — DIGOXIN LEVEL: Digoxin Level: 1 ng/mL (ref 0.8–2.0)

## 2018-02-20 LAB — CBC
HCT: 30.8 % — ABNORMAL LOW (ref 36.0–46.0)
Hemoglobin: 9.8 g/dL — ABNORMAL LOW (ref 12.0–15.0)
MCH: 30.2 pg (ref 26.0–34.0)
MCHC: 31.8 g/dL (ref 30.0–36.0)
MCV: 95.1 fL (ref 78.0–100.0)
Platelets: 435 10*3/uL — ABNORMAL HIGH (ref 150–400)
RBC: 3.24 MIL/uL — ABNORMAL LOW (ref 3.87–5.11)
RDW: 16.8 % — AB (ref 11.5–15.5)
WBC: 14.5 10*3/uL — ABNORMAL HIGH (ref 4.0–10.5)

## 2018-02-20 MED ORDER — DIGOXIN 125 MCG PO TABS
0.0625 mg | ORAL_TABLET | Freq: Every day | ORAL | Status: DC
  Administered 2018-02-21 – 2018-02-27 (×7): 0.0625 mg via ORAL
  Filled 2018-02-20 (×7): qty 1

## 2018-02-20 MED ORDER — LOSARTAN POTASSIUM 25 MG PO TABS
25.0000 mg | ORAL_TABLET | Freq: Every day | ORAL | Status: DC
  Administered 2018-02-21 – 2018-02-27 (×7): 25 mg via ORAL
  Filled 2018-02-20 (×7): qty 1

## 2018-02-20 NOTE — Progress Notes (Signed)
Physical Therapy Treatment Patient Details Name: Brittany [name] MRN: [id number] DOB: 01/18/1961 Today's Date: 02/20/2018    History of Present Illness Pt is a 57 y.o. female s/p emergent CABGx3 on 02/03/18. IABP removed 2/25. Impella removed 3/1. Intubated 2/24-3/1. PMH includes small cell lung cancer (s/p treatment 2015), HTN, PAF, CHF, asthma, anxiety.    PT Comments    Pt remains weak with limited activity tolerance. Continue to feel she needs ST-SNF prior to return home. Removed O2 initially with pt maintaining SpO2 > 92% with in room activity. At that point unable to obtain SpO2 from finger probe. Replaced O2 at 2L prior to amb in hallway. Unable to obtain SpO2 throughout amb. When pt returned to chair SpO2 95%.    Follow Up Recommendations  SNF     Equipment Recommendations  (TBD at next venue)    Recommendations for Other Services       Precautions / Restrictions Precautions Precautions: Fall;Sternal Precaution Comments: Verbally reviewed Restrictions Other Position/Activity Restrictions: Sternal    Mobility  Bed Mobility Overal bed mobility: Needs Assistance Bed Mobility: Rolling;Sidelying to Sit Rolling: Min assist Sidelying to sit: Min assist       General bed mobility comments: Verbal cues for sternal precautions with pt tending to want to pull on rail to turn  Transfers Overall transfer level: Needs assistance Equipment used: 4-wheeled walker;1 person hand held assist Transfers: Sit to/from Stand Sit to Stand: Min assist         General transfer comment: Assist to bring hips up and for balance  Ambulation/Gait Ambulation/Gait assistance: Min assist;Mod assist Ambulation Distance (Feet): 120 Feet Assistive device: 4-wheeled walker Gait Pattern/deviations: Step-through pattern;Decreased step length - right;Decreased step length - left;Shuffle;Drifts right/left Gait velocity: decr Gait velocity interpretation: Below normal speed for  age/gender General Gait Details: Assist for balance and support. Pt fatigued quickly.   Stairs            Wheelchair Mobility    Modified Rankin (Stroke Patients Only)       Balance Overall balance assessment: Needs assistance Sitting-balance support: Feet supported;No upper extremity supported Sitting balance-Leahy Scale: Fair     Standing balance support: Single extremity supported Standing balance-Leahy Scale: Poor Standing balance comment: UE support and min assist for static standing                            Cognition Arousal/Alertness: Awake/alert Behavior During Therapy: WFL for tasks assessed/performed Overall Cognitive Status: Within Functional Limits for tasks assessed                                        Exercises      General Comments        Pertinent Vitals/Pain Pain Assessment: Faces Faces Pain Scale: Hurts even more Pain Location: chest incision Pain Descriptors / Indicators: Operative site guarding Pain Intervention(s): Limited activity within patient's tolerance;Monitored during session    Home Living                      Prior Function            PT Goals (current goals can now be found in the care plan section) Progress towards PT goals: Progressing toward goals    Frequency    Min 3X/week      PT Plan Current plan remains appropriate  Co-evaluation              AM-PAC PT "6 Clicks" Daily Activity  Outcome Measure  Difficulty turning over in bed (including adjusting bedclothes, sheets and blankets)?: Unable Difficulty moving from lying on back to sitting on the side of the bed? : Unable Difficulty sitting down on and standing up from a chair with arms (e.g., wheelchair, bedside commode, etc,.)?: Unable Help needed moving to and from a bed to chair (including a wheelchair)?: A Little Help needed walking in hospital room?: A Lot Help needed climbing 3-5 steps with a railing?  : Total 6 Click Score: 9    End of Session Equipment Utilized During Treatment: Gait belt;Oxygen Activity Tolerance: Patient limited by fatigue Patient left: with call bell/phone within reach;in chair Nurse Communication: Mobility status PT Visit Diagnosis: Other abnormalities of gait and mobility (R26.89);Muscle weakness (generalized) (M62.81)     Time: 5003-7048 PT Time Calculation (min) (ACUTE ONLY): 29 min  Charges:  $Gait Training: 23-37 mins                    G Codes:       [facility] PT [place] 02/20/2018, 2:00 PM

## 2018-02-20 NOTE — Clinical Social Work Note (Signed)
CSW visited room to talk with patient regarding SNF recommendation for ST rehab by PT. Patient was lying in bed and informed that she was exhausted due to being moved today to 4E from ICU and requested that CSW talk with her on Thursday. CSW expressed understanding and responded that CSW would return tomorrow.  Brittany [name], MSW, LCSW Licensed Clinical Social Worker [place] [phone]

## 2018-02-20 NOTE — Progress Notes (Addendum)
  Speech Language Pathology Treatment: Dysphagia  Patient Details Name: Brittany [name] MRN: [id number] DOB: 1961-03-23 Today's Date: 02/20/2018 Time: 2449-7530 SLP Time Calculation (min) (ACUTE ONLY): 14 min  Assessment / Plan / Recommendation Clinical Impression  Pt physically and mentally tired today after her usual 3 am waking time (needing Bipap), moving rooms this morning and siting in chair. Reviewed RN documentation of lung sounds since initiating water protocol yesterday- diminished (stable) 3/13 and rhonchi 2/12; afebrile. SLP noted wet vocal quality on arrival. Several sips thin liquid consumed after oral care without overt s/s aspiration. Likes mechanical soft diet more now since "not chopping meats as much, like dog/baby food." She is now off HF nasal cannula. Did not have pt perform EMT this session given lethargy. Given continued deconditioning and improving respirations will continue Dys 3 diet however suspect upgrade to regular texture soon; continue nectar thick and water protocol with close ST observation with water protocol.    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 She was admitted with NSTEMI and shock, s/p emergent CABG on 02/03/18. Intubated 02/03/18 to 02/08/18.       SLP Plan  Continue with current plan of care       Recommendations  Diet recommendations: Dysphagia 3 (mechanical soft);Nectar-thick liquid;Other(comment)(water protocol) Liquids provided via: Cup Medication Administration: Whole meds with puree Supervision: Patient able to self feed Compensations: Small sips/bites;Slow rate;Clear throat intermittently Postural Changes and/or Swallow Maneuvers: Seated upright 90 degrees                Oral Care Recommendations: Oral care  BID Follow up Recommendations: None SLP Visit Diagnosis: Dysphagia, pharyngeal phase (R13.13) Plan: Continue with current plan of care                       [name] 02/20/2018, 12:58 PM  [name].[employer] [phone]

## 2018-02-20 NOTE — Evaluation (Signed)
Occupational Therapy Evaluation Patient Details Name: Brittany [name] MRN: [id number] DOB: Sep 26, 1961 Today's Date: 02/20/2018    History of Present Illness Pt is a 57 y.o. female s/p emergent CABGx3 on 02/03/18. IABP removed 2/25. Impella removed 3/1. Intubated 2/24-3/1. PMH includes small cell lung cancer (s/p treatment 2015), HTN, PAF, CHF, asthma, anxiety.   Clinical Impression   Pt was admitted for the above. She was limited by fatique at time of OT evaluation. She is normally independent with adls/IADLs at baseline.  Pt will benefit from continued OT to increase safety and independence with adls following sternal precautions. She will benefit from OT in both acute and post acute settings    Follow Up Recommendations  SNF    Equipment Recommendations  (defer to next venue.  Pt is 4'll")    Recommendations for Other Services       Precautions / Restrictions Precautions Precautions: Fall;Sternal Precaution Booklet Issued: Yes (comment) Precaution Comments: Verbally reviewed Restrictions Other Position/Activity Restrictions: Sternal      Mobility Bed Mobility Overal bed mobility: Needs Assistance Bed Mobility: Rolling;Sidelying to Sit Rolling: Min assist        General bed mobility comments: mod A for back to bed  Transfers Overall transfer level: Needs assistance Equipment used: 4-wheeled walker Transfers: Sit to/from Stand Sit to Stand: Min assist             Balance Overall balance assessment: Needs assistance Sitting-balance support: Feet supported;No upper extremity supported Sitting balance-Leahy Scale: Fair     Standing balance support: Single extremity supported Standing balance-Leahy Scale: Poor Standing balance comment: UE support and min assist for static standing                           ADL either performed or assessed with clinical judgement   ADL Overall ADL's : Needs assistance/impaired Eating/Feeding:  Independent   Grooming: Set up;Sitting   Upper Body Bathing: Minimal assistance;Sitting   Lower Body Bathing: Moderate assistance;Sit to/from stand   Upper Body Dressing : Moderate assistance;Sitting   Lower Body Dressing: Maximal assistance;Sit to/from stand                 General ADL Comments: Pt observed walking in hall with cardiac rehab.  Pt fatiqued but agreeable to working with OT.  Educated on concept of AE if needed for energy conservation. Reviewed energy conservation and sternal precautions.  Handout given. Pt was unable to state any precautions at beginning of session.     Vision         Perception     Praxis      Pertinent Vitals/Pain Pain Assessment: Faces Faces Pain Scale: Hurts even more Pain Location: chest incision Pain Descriptors / Indicators: Operative site guarding Pain Intervention(s): Limited activity within patient's tolerance;Monitored during session;Repositioned     Hand Dominance     Extremity/Trunk Assessment Upper Extremity Assessment Upper Extremity Assessment: Generalized weakness(sternal precautions)           Communication Communication Communication: No difficulties   Cognition Arousal/Alertness: Awake/alert Behavior During Therapy: WFL for tasks assessed/performed Overall Cognitive Status: Within Functional Limits for tasks assessed                                     General Comments  VSS. c/o dizziness at end of cardiac rehab; BP WFLs    Exercises  Shoulder Instructions      Home Living Family/patient expects to be discharged to:: Private residence Living Arrangements: Spouse/significant other                 Bathroom Shower/Tub: Teacher, early years/pre: Standard         Additional Comments: Does not have family available for 24/7 support if needed      Prior Functioning/Environment Level of Independence: Independent                 OT Problem List:  Decreased strength;Decreased activity tolerance;Impaired balance (sitting and/or standing);Decreased knowledge of use of DME or AE;Decreased knowledge of precautions;Pain      OT Treatment/Interventions: Self-care/ADL training;DME;AE education;Patient/family education;energy conservation    OT Goals(Current goals can be found in the care plan section) Acute Rehab OT Goals Patient Stated Goal: Be able to do something for myself OT Goal Formulation: With patient Time For Goal Achievement: 03/06/18 Potential to Achieve Goals: Good ADL Goals Pt Will Transfer to Toilet: with supervision;ambulating;regular height toilet(vs 3:1) Pt Will Perform Toileting - Clothing Manipulation and hygiene: with supervision;sit to/from stand;sitting/lateral leans Additional ADL Goal #1: pt will complete adl with set up/supervison and AE as needed following sternal precautions and initiating at least one rest break for energy conservation Additional ADL Goal #2: pt will perform bed mobility at supervision level following sternal precautions in preparation for adls  OT Frequency: Min 2X/week   Barriers to D/C:            Co-evaluation              AM-PAC PT "6 Clicks" Daily Activity     Outcome Measure Help from another person eating meals?: A Little Help from another person taking care of personal grooming?: A Little Help from another person toileting, which includes using toliet, bedpan, or urinal?: A Lot Help from another person bathing (including washing, rinsing, drying)?: A Lot Help from another person to put on and taking off regular upper body clothing?: A Little Help from another person to put on and taking off regular lower body clothing?: A Lot 6 Click Score: 15   End of Session    Activity Tolerance: Patient limited by fatigue Patient left: in bed;with call bell/phone within reach  OT Visit Diagnosis: Muscle weakness (generalized) (M62.81)                Time: 9470-9628 OT Time  Calculation (min): 18 min Charges:  OT General Charges $OT Visit: 1 Visit OT Evaluation $OT Eval Low Complexity: 1 Low G-Codes:     Brittany [name], OTR/L [phone] 02/20/2018  Brittany [name] 02/20/2018, 3:44 PM

## 2018-02-20 NOTE — Progress Notes (Addendum)
Patient ID: Brittany [name], female   DOB: 1961/05/24, 57 y.o.   MRN: [id number]      Advanced Heart Failure Rounding Note  PCP-Cardiologist: No primary care provider on file.   Subjective:    Events S/p emergent CABG 02/03/18 IABP removed 02/04/18 Impella out 02/08/18 Extubated 02/08/18  Coox not drawn. CVP not connected  Feeling OK this am. No SOB. Mild incision pain. Walked halls this am with mild lightheadedness.   ECHO 3/4 EF 25-30%. Grade II DD. Moderate-severe aortic insufficiency.   TEE 02/03/18 LVEF 20%, Mild to Mod AR, Mild/Mod MR, RV normal.   Objective:   Weight Range: 104 lb 4.4 oz (47.3 kg) Body mass index is 19.7 kg/m.   Vital Signs:   Temp:  [97.6 F (36.4 C)-98.9 F (37.2 C)] 98.3 F (36.8 C) (03/13 0603) Pulse Rate:  [72-90] 85 (03/13 0603) Resp:  [12-21] 20 (03/13 0603) BP: (96-124)/(54-79) 110/68 (03/13 0603) SpO2:  [98 %-100 %] 100 % (03/13 0603) FiO2 (%):  [40 %] 40 % (03/13 0358) Weight:  [104 lb 4.4 oz (47.3 kg)] 104 lb 4.4 oz (47.3 kg) (03/13 0603) Last BM Date: 02/19/18  Weight change: Filed Weights   02/18/18 0500 02/19/18 0500 02/20/18 0603  Weight: 104 lb 8 oz (47.4 kg) 103 lb 3.2 oz (46.8 kg) 104 lb 4.4 oz (47.3 kg)    Intake/Output:   Intake/Output Summary (Last 24 hours) at 02/20/2018 1037 Last data filed at 02/20/2018 0955 Gross per 24 hour  Intake 1270 ml  Output 500 ml  Net 770 ml    Physical Exam   CVP disconnected.  General: Fatigued. NAD.  HEENT: Normal Neck: Supple. JVP 6-7 cm. Carotids 2+ bilat; no bruits. No thyromegaly or nodule noted. Cor: PMI nondisplaced. RRR, 1/6 SEM RUSB Lungs: CTAB, normal effort. Abdomen: Soft, non-tender, non-distended, no HSM. No bruits or masses. +BS  Extremities: No cyanosis, clubbing, or rash. R and LLE no edema.  Neuro: Alert & orientedx3, cranial nerves grossly intact. moves all 4 extremities w/o difficulty. Affect pleasant   Telemetry   NSR 80-90s, personally reviewed.      Labs    CBC Recent Labs    02/19/18 0347 02/20/18 0354  WBC 14.5* 14.5*  HGB 9.9* 9.8*  HCT 31.0* 30.8*  MCV 93.7 95.1  PLT 432* 010*   Basic Metabolic Panel Recent Labs    02/18/18 0300 02/19/18 0347 02/20/18 0354  NA 134* 136 136  K 4.2 3.9 4.7  CL 96* 98* 101  CO2 30 29 28   GLUCOSE 162* 200* 156*  BUN 16 16 17   CREATININE 0.82 0.80 0.82  CALCIUM 8.9 8.8* 9.1  MG 2.2  --   --    Liver Function Tests No results for input(s): AST, ALT, ALKPHOS, BILITOT, PROT, ALBUMIN in the last 72 hours. No results for input(s): LIPASE, AMYLASE in the last 72 hours. Cardiac Enzymes No results for input(s): CKTOTAL, CKMB, CKMBINDEX, TROPONINI in the last 72 hours.  BNP: BNP (last 3 results) Recent Labs    02/03/18 0853 02/15/18 0449  BNP 3,127.4* 2,526.7*    ProBNP (last 3 results) No results for input(s): PROBNP in the last 8760 hours.   D-Dimer No results for input(s): DDIMER in the last 72 hours. Hemoglobin A1C No results for input(s): HGBA1C in the last 72 hours. Fasting Lipid Panel No results for input(s): CHOL, HDL, LDLCALC, TRIG, CHOLHDL, LDLDIRECT in the last 72 hours. Thyroid Function Tests No results for input(s): TSH, T4TOTAL, T3FREE, THYROIDAB in the  last 72 hours.  Invalid input(s): FREET3  Other results:   Imaging    Dg Chest 2 View  Result Date: 02/20/2018 CLINICAL DATA:  Congestive heart failure. Recent coronary artery bypass grafting EXAM: CHEST - 2 VIEW COMPARISON:  February 18, 2018 chest radiograph and chest CT February 19, 2018 FINDINGS: Central catheter tip is in superior vena cava. No pneumothorax. There is a small right pleural effusion. There is perihilar interstitial edema. There is consolidation in the right perihilar region, better demonstrated on recent CT. Lungs elsewhere are clear. Heart is mildly enlarged with pulmonary vascularity within normal limits. No adenopathy identified beyond potential adenopathy in the right hilar region.  Patient is status post coronary artery bypass grafting. No evident bone lesions. IMPRESSION: Consolidation right perihilar region. Suspect pneumonia. Underlying mass cannot be excluded by radiography in this area. There is perihilar interstitial edema, stable. Small right pleural effusion. Stable cardiomegaly. Central catheter tip in superior vena cava without pneumothorax. Electronically Signed   By: Lowella Grip III M.D.   On: 02/20/2018 07:58   Ct Chest Wo Contrast  Result Date: 02/19/2018 CLINICAL DATA:  Inpatient admitted with acute systolic CHF requiring emergent CABG 02/03/2018. 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 EXAM: CT CHEST WITHOUT CONTRAST TECHNIQUE: Multidetector CT imaging of the chest was performed following the standard protocol without IV contrast. COMPARISON:  04/21/2015 chest CT. Chest radiograph from one day prior. FINDINGS: Cardiovascular: Mild cardiomegaly. Trace pericardial effusion/thickening. Interval postsurgical changes from CABG. Epicardial pacer leads are noted in the right anterior and inferior pericardium. Left PICC terminates in the right atrium. Atherosclerotic nonaneurysmal thoracic aorta. Top-normal main pulmonary artery diameter 3.1 cm. Mediastinum/Nodes: No discrete thyroid nodules. Unremarkable esophagus. No axillary adenopathy. No discrete enlarged mediastinal or left hilar nodes on this noncontrast scan. Lungs/Pleura: No pneumothorax. Small dependent bilateral pleural effusions. Mild-to-moderate centrilobular emphysema. There is masslike consolidation in the perihilar right lung measuring 8.9 x 4.4 cm (series 4/image 66), which demonstrates a relatively sharp lateral margin, which is confluent with the right hilum and subcarinal and lower right paratracheal mediastinum, and which is new compared to the most recent  chest CT comparison study of 04/21/2015. There is patchy ground-glass attenuation and interlobular septal thickening throughout both lungs. Mild dependent atelectasis in both lower lobes. No significant pulmonary nodules in the aerated portions of the lungs. Upper abdomen: No acute abnormality. Musculoskeletal: No aggressive appearing focal osseous lesions. Intact sternotomy wires. IMPRESSION: 1. Large masslike focus of consolidation in the perihilar right lung is nonspecific and new since the most recent comparison chest CT of 04/21/2015. Findings could represent any combination of perihilar pneumonia, recurrent tumor, atelectasis and/or delayed radiation change. Short-term follow-up chest CT advised, preferably with IV contrast. 2. Suspect superimposed mild congestive heart failure with small dependent bilateral pleural effusions. Aortic Atherosclerosis (ICD10-I70.0) and Emphysema (ICD10-J43.9). Electronically Signed   By: Ilona Sorrel M.D.   On: 02/19/2018 14:39     Medications:     Scheduled Medications: . amiodarone  200 mg Oral BID  . aspirin EC  81 mg Oral Daily  . atorvastatin  80 mg Oral q1800  . Chlorhexidine Gluconate Cloth  6 each Topical Daily  . clopidogrel  75 mg Oral Daily  . digoxin  0.125 mg Oral Daily  . enoxaparin (LOVENOX) injection  40 mg Subcutaneous Q24H  . feeding supplement (ENSURE ENLIVE)  237 mL Oral TID  BM  . furosemide  40 mg Oral Daily  . ipratropium-albuterol  3 mL Nebulization BID  . ivabradine  5 mg Oral BID WC  . losartan  25 mg Oral BID  . mouth rinse  15 mL Mouth Rinse BID  . pantoprazole  40 mg Oral Daily  . sodium chloride flush  10-40 mL Intracatheter Q12H  . spironolactone  25 mg Oral Daily    Infusions: . sodium chloride Stopped (02/18/18 1540)  . cefTAZidime (FORTAZ)  IV 1 g (02/20/18 0607)    PRN Medications: ALPRAZolam, bisacodyl **OR** bisacodyl, docusate sodium, levalbuterol, metoprolol tartrate, morphine injection, ondansetron (ZOFRAN)  IV, oxyCODONE, RESOURCE THICKENUP CLEAR, sodium chloride flush, traMADol  Patient Profile   Velia Pamer is a 57 y/o woman with a history of PAF, preveious smoker quit 4 years ago, hypertension, 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 She was admitted with NSTEMI and shock.  Emergent cath 02/03/18 showed LAD 100% stenosed, LCx 95% stenosed. Taken for emergent CABG 02/03/18  Assessment/Plan   1. 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  Cath with severe 2v CAD as above with severe LV dysfunction/ICM. s/p Emergent CABG 02/03/18.   - No CP.   - On aspirin, plavix and atorvastatin.  2. Acute systolic HF -> cardiogenic shock in setting of #1: TEE 02/03/18 LVEF 20%, Mild to Mod AR, Mild/Mod MR, RV normal. Impella out 3/1.  Echo 3/4 with moderate-severe aortic insufficiency which is likely contributing to CHF.  - CVP and Coox discontinued. Will plan to pull PICC prior to discharge. Leave in for now to avoid frequent sticks.  - Volume status stable on exam.  - Continue lasix 40 mg daily.  - Continue digoxin 0.125 mg daily - Continue spironolactone 25 mg daily.  - Continue ivabradine 5 mg BID. - Continue losartan 25 mg BID.  3. Atrial fibrillation: Paroxysmal.  Remains in NSR.  CHA2DS2/VASc is at least 4. (CHF, Vasc disease, HTN, Female).   - She has history of Afib RVR in the past in the setting of her Lung CA. Previously on Xarelto but stopped her medications. Dr. Roxy Manns OK with stopping plavix and starting Eliquis if decided on.  - Continue po amiodarone 200 gm BID.  - Currently not anticoagulated, will discuss with team (she is on ASA and Plavix).  4. 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 Will need f/u with oncology (Dr. Julien Nordmann) as outpatient.  - Chest CT 02/19/42 with mass-like consolidation in R hilum concerning for recurrent tumor.  5. Anemia:  - Hgb 9.8  today.   6. Aortic insufficiency: Moderate-severe on postop echo.  Likely potentiating CHF.   - Added afterload reduction with ARB. No change.  7. ID: Covering for PNA with vancomycin/ceftazidime. - Vanc stopped 02/17/18. - Psuedomonas in urine. Last day of ceftaz today - WBC remains elevated. Afebrile. Encouraged IS.   Off inotrope support. Slowly improving. No room to up-titrate meds as above. CT chest as above concerning. Will likely need further imaging. Will discuss with MD.   Length of Stay: [address] [place]  02/20/2018, 10:37 AM  Advanced Heart Failure Team Pager [phone] (M-F; 7a - 4p)  Please contact [facility] for night-coverage after hours (4p -7a ) and weekends on [email or web address]  Patient seen and examined with the above-signed Advanced Practice Provider and/or Housestaff. I personally reviewed laboratory data, imaging studies and relevant notes.  I independently examined the patient and formulated the important aspects of the plan. I have edited the note to reflect any of my changes or salient points. I have personally discussed the plan with the patient and/or family.  Feels weak. BP soft. Mildly orthostatic. Also c/o some incisional pain. Volume status looks ok. Will cut back losartan. Continue ivabradine. Continue PT. Will need SNF. I will review chest CT with Dr. Roxy Manns in am. Agree with ASA/Plavix. Discussed dosing with PharmD personally.  Glori Bickers, MD  9:43 PM

## 2018-02-20 NOTE — Progress Notes (Signed)
CARDIAC REHAB PHASE I   PRE:  Rate/Rhythm: 90 SR    BP: sitting 106/63, 94/82 standing    SaO2: 100 2L HFNC  MODE:  Ambulation: 210 ft total (x2 sitting rest stops)  POST:  Rate/Rhythm: 98 RA    BP: sitting 116/69     SaO2: 98 2L HFNC  Pt in recliner. Sts she is exhausted but willing to walk, smiling. Pt able to scoot hips forward and stand with min assist. Took BP standing, slight drop. Pt sts she is dizzy but wants to walk. Used RW and followed with rollator for sitting option. Used gait belt for assist, x2 assist, 2L HFNC. Pt struggled to steer RW at times. Had her sit and rest x2 for fatigue. Pt seemed to lack good judgement when up. Stated she was dizzy when asked but BP stable upon return to room. Pt exhausted and wanted to go to bed. OT in to work with her.  [place], [place] 02/20/2018 2:37 PM

## 2018-02-20 NOTE — Progress Notes (Addendum)
      [address]       [place] [address]             [phone]      12 Days Post-Op Procedure(s) (LRB): REMOVAL OF IMPELLA LEFT VENTRICULAR ASSIST DEVICE (N/A) TRANSESOPHAGEAL ECHOCARDIOGRAM (TEE) (N/A) Subjective: She is having some incisional pain. She also states she has had some nausea but not enough to need medication.  Objective: Vital signs in last 24 hours: Temp:  [97.6 F (36.4 C)-98.9 F (37.2 C)] 98.3 F (36.8 C) (03/13 0603) Pulse Rate:  [72-90] 85 (03/13 0603) Cardiac Rhythm: Normal sinus rhythm (03/13 0700) Resp:  [12-21] 20 (03/13 0603) BP: (96-125)/(54-79) 110/68 (03/13 0603) SpO2:  [98 %-100 %] 100 % (03/13 0603) FiO2 (%):  [40 %] 40 % (03/13 0358) Weight:  [104 lb 4.4 oz (47.3 kg)] 104 lb 4.4 oz (47.3 kg) (03/13 0603)     Intake/Output from previous day: 03/12 0701 - 03/13 0700 In: 1710 [P.O.:360; IV Piggyback:1350] Out: 501 [Urine:500; Stool:1] Intake/Output this shift: No intake/output data recorded.  General appearance: alert, cooperative and no distress Heart: regular rate and rhythm, S1, S2 normal, no murmur, click, rub or gallop Lungs: clear to auscultation bilaterally Abdomen: central tenderness Extremities: extremities normal, atraumatic, no cyanosis or edema Wound: clean and dry  Lab Results: Recent Labs    02/19/18 0347 02/20/18 0354  WBC 14.5* 14.5*  HGB 9.9* 9.8*  HCT 31.0* 30.8*  PLT 432* 435*   BMET:  Recent Labs    02/19/18 0347 02/20/18 0354  NA 136 136  K 3.9 4.7  CL 98* 101  CO2 29 28  GLUCOSE 200* 156*  BUN 16 17  CREATININE 0.80 0.82  CALCIUM 8.8* 9.1    PT/INR: No results for input(s): LABPROT, INR in the last 72 hours. ABG    Component Value Date/Time   PHART 7.433 02/09/2018 0415   HCO3 28.0 02/09/2018 0415   TCO2 40 (H) 02/16/2018 1435   ACIDBASEDEF 1.0 02/08/2018 2117   O2SAT 66.0 02/19/2018 0345   CBG (last 3)  No results for input(s): GLUCAP in the last 72  hours.  Assessment/Plan: S/P Procedure(s) (LRB): REMOVAL OF IMPELLA LEFT VENTRICULAR ASSIST DEVICE (N/A) TRANSESOPHAGEAL ECHOCARDIOGRAM (TEE) (N/A)  1. CV-Remains in NSR in the 80s. BP well controlled.  Off milrinone. Heart failure following. On Amio, Digoxin, Cozaar, and Spironolactone. Continue ASA and statin.  2. Pulm-On 3L HFNC. With good oxygen saturation. Continue nebs.  3. Renal-creatinine 0.82. Electrolytes okay. Making good urine. Weight remains stable. 4. H and H stable. Platelets 435k.  5. Endo-blood glucose well controlled.  6. ID-Leukocytosis w/out fever, trending down. Continue [name] for a total of 10 days 7. Case management consulted  Plan: Continue to wean oxygen as tolerated. Try and walk in the halls today. Continues to make slow and steady progress.     LOS: 17 days    Elgie Collard 02/20/2018  I have seen and examined the patient and agree with the assessment and plan as outlined.  Medications and timing of d/c per Dr [name] and advanced heart failure team  Rexene Alberts, MD 02/20/2018 9:39 AM

## 2018-02-21 DIAGNOSIS — I25110 Atherosclerotic heart disease of native coronary artery with unstable angina pectoris: Secondary | ICD-10-CM

## 2018-02-21 LAB — CBC WITH DIFFERENTIAL/PLATELET
BASOS ABS: 0 10*3/uL (ref 0.0–0.1)
Basophils Relative: 0 %
EOS PCT: 5 %
Eosinophils Absolute: 0.6 10*3/uL (ref 0.0–0.7)
HEMATOCRIT: 31.2 % — AB (ref 36.0–46.0)
Hemoglobin: 10 g/dL — ABNORMAL LOW (ref 12.0–15.0)
LYMPHS PCT: 8 %
Lymphs Abs: 1.1 10*3/uL (ref 0.7–4.0)
MCH: 30.3 pg (ref 26.0–34.0)
MCHC: 32.1 g/dL (ref 30.0–36.0)
MCV: 94.5 fL (ref 78.0–100.0)
MONO ABS: 0.7 10*3/uL (ref 0.1–1.0)
MONOS PCT: 5 %
NEUTROS ABS: 10.6 10*3/uL — AB (ref 1.7–7.7)
Neutrophils Relative %: 82 %
PLATELETS: 413 10*3/uL — AB (ref 150–400)
RBC: 3.3 MIL/uL — ABNORMAL LOW (ref 3.87–5.11)
RDW: 16.8 % — AB (ref 11.5–15.5)
WBC: 13 10*3/uL — ABNORMAL HIGH (ref 4.0–10.5)

## 2018-02-21 LAB — BASIC METABOLIC PANEL
ANION GAP: 10 (ref 5–15)
BUN: 20 mg/dL (ref 6–20)
CO2: 26 mmol/L (ref 22–32)
Calcium: 9.1 mg/dL (ref 8.9–10.3)
Chloride: 98 mmol/L — ABNORMAL LOW (ref 101–111)
Creatinine, Ser: 0.85 mg/dL (ref 0.44–1.00)
GFR calc Af Amer: >60 mL/min (ref >60)
GLUCOSE: 234 mg/dL — AB (ref 65–99)
POTASSIUM: 4.9 mmol/L (ref 3.5–5.1)
Sodium: 134 mmol/L — ABNORMAL LOW (ref 135–145)

## 2018-02-21 LAB — HEMOGLOBIN A1C
HEMOGLOBIN A1C: 5.6 % (ref 4.8–5.6)
MEAN PLASMA GLUCOSE: 114.02 mg/dL

## 2018-02-21 MED ORDER — AMIODARONE HCL 200 MG PO TABS
200.0000 mg | ORAL_TABLET | Freq: Every day | ORAL | Status: DC
  Administered 2018-02-22 – 2018-02-27 (×6): 200 mg via ORAL
  Filled 2018-02-21 (×6): qty 1

## 2018-02-21 NOTE — Progress Notes (Signed)
CARDIAC REHAB PHASE I   PRE:  Rate/Rhythm: 86 SR  BP:  Supine:   Sitting: 106/60  Standing:    SaO2: 100% bipap  MODE:  Ambulation: 300 ft  ( 2 seated rest breaks)   POST:  Rate/Rhythm: 101 ST  BP:  Supine:   Sitting: 108/69  Standing:    SaO2: 99% 2L HFNC 1010-1045 Pt walked 300 ft on 2L HFNC, rolling walker, gait belt use, and asst x 1. One asst to follow with rollator as pt had to sit twice to rest her legs. She fatigues quickly but takes short rest breaks. To recliner after walk. Left on 2L HFNC. Gave her new IS as she stated the other one got broken. Encouraged pt to take her pain med as needed as she c/o feeling sore at times.    Graylon Good, RN BSN  02/21/2018 10:39 AM

## 2018-02-21 NOTE — Progress Notes (Addendum)
Patient ID: Brittany [name], female   DOB: [birth year], 57 y.o.   MRN: [id number]      Advanced Heart Failure Rounding Note  PCP-Cardiologist: No primary care provider on file.   Subjective:    Events S/p emergent CABG 02/03/18 IABP removed 02/04/18 Impella out 02/08/18 Extubated 02/08/18  Soreness slightly worse today, but otherwise doing well.  Remains mildly lightheaded with walking halls.   Labs pending.  ECHO 3/4 EF 25-30%. Grade II DD. Moderate-severe aortic insufficiency.   TEE 02/03/18 LVEF 20%, Mild to Mod AR, Mild/Mod MR, RV normal.   Objective:   Weight Range: 102 lb 14.4 oz (46.7 kg) Body mass index is 19.44 kg/m.   Vital Signs:   Temp:  [97.8 F (36.6 C)-98.7 F (37.1 C)] 98.1 F (36.7 C) (03/14 0442) Pulse Rate:  [84-88] 87 (03/14 0750) Resp:  [18-22] 18 (03/13 2355) BP: (109-125)/(66-80) 119/73 (03/14 0750) SpO2:  [100 %] 100 % (03/14 0834) Weight:  [102 lb 14.4 oz (46.7 kg)] 102 lb 14.4 oz (46.7 kg) (03/14 0442) Last BM Date: 02/19/18  Weight change: Filed Weights   02/19/18 0500 02/20/18 0603 02/21/18 0442  Weight: 103 lb 3.2 oz (46.8 kg) 104 lb 4.4 oz (47.3 kg) 102 lb 14.4 oz (46.7 kg)    Intake/Output:   Intake/Output Summary (Last 24 hours) at 02/21/2018 1054 Last data filed at 02/21/2018 0853 Gross per 24 hour  Intake 362 ml  Output -  Net 362 ml    Physical Exam   CVP disconnected.  General: Fatigued.  No resp difficulty. HEENT: Normal Neck: Supple. JVP 6-7 cm. Carotids 2+ bilat; no bruits. No thyromegaly or nodule noted. Cor: PMI nondisplaced. RRR, 1/6 SEM RUSB.  Lungs: CTAB, normal effort. Abdomen: Soft, non-tender, non-distended, no HSM. No bruits or masses. +BS  Extremities: No cyanosis, clubbing, or rash. R and LLE no edema.  Neuro: Alert & orientedx3, cranial nerves grossly intact. moves all 4 extremities w/o difficulty. Affect pleasant   Telemetry   NSR 80-90s, personally reviewed.   Labs    CBC Recent Labs   02/19/18 0347 02/20/18 0354  WBC 14.5* 14.5*  HGB 9.9* 9.8*  HCT 31.0* 30.8*  MCV 93.7 95.1  PLT 432* 831*   Basic Metabolic Panel Recent Labs    02/19/18 0347 02/20/18 0354  NA 136 136  K 3.9 4.7  CL 98* 101  CO2 29 28  GLUCOSE 200* 156*  BUN 16 17  CREATININE 0.80 0.82  CALCIUM 8.8* 9.1   Liver Function Tests No results for input(s): AST, ALT, ALKPHOS, BILITOT, PROT, ALBUMIN in the last 72 hours. No results for input(s): LIPASE, AMYLASE in the last 72 hours. Cardiac Enzymes No results for input(s): CKTOTAL, CKMB, CKMBINDEX, TROPONINI in the last 72 hours.  BNP: BNP (last 3 results) Recent Labs    02/03/18 0853 02/15/18 0449  BNP 3,127.4* 2,526.7*    ProBNP (last 3 results) No results for input(s): PROBNP in the last 8760 hours.   D-Dimer No results for input(s): DDIMER in the last 72 hours. Hemoglobin A1C Recent Labs    02/21/18 0842  HGBA1C 5.6   Fasting Lipid Panel No results for input(s): CHOL, HDL, LDLCALC, TRIG, CHOLHDL, LDLDIRECT in the last 72 hours. Thyroid Function Tests No results for input(s): TSH, T4TOTAL, T3FREE, THYROIDAB in the last 72 hours.  Invalid input(s): FREET3  Other results:   Imaging    No results found.   Medications:     Scheduled Medications: . amiodarone  200 mg Oral  BID  . aspirin EC  81 mg Oral Daily  . atorvastatin  80 mg Oral q1800  . Chlorhexidine Gluconate Cloth  6 each Topical Daily  . clopidogrel  75 mg Oral Daily  . digoxin  0.0625 mg Oral Daily  . enoxaparin (LOVENOX) injection  40 mg Subcutaneous Q24H  . feeding supplement (ENSURE ENLIVE)  237 mL Oral TID BM  . furosemide  40 mg Oral Daily  . ipratropium-albuterol  3 mL Nebulization BID  . ivabradine  5 mg Oral BID WC  . losartan  25 mg Oral Daily  . mouth rinse  15 mL Mouth Rinse BID  . pantoprazole  40 mg Oral Daily  . sodium chloride flush  10-40 mL Intracatheter Q12H  . spironolactone  25 mg Oral Daily    Infusions: . 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 She was admitted with NSTEMI and shock.  Emergent cath 02/03/18 showed LAD 100% stenosed, LCx 95% stenosed. Taken for emergent CABG 02/03/18  Assessment/Plan   1. 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  Cath with severe 2v CAD as above with severe LV dysfunction/ICM. s/p Emergent CABG 02/03/18.   - Denies CP.  - On aspirin, plavix and atorvastatin.  2. Acute systolic HF -> cardiogenic shock in setting of #1: TEE 02/03/18 LVEF 20%, Mild to Mod AR, Mild/Mod MR, RV normal. Impella out 3/1.  Echo 3/4 with moderate-severe aortic insufficiency which is likely contributing to CHF.  - No longer following CVP/Coox.  - Will plan to pull PICC prior to discharge. Leave in for now to avoid frequent sticks.  - Volume status stable on lasix 40 mg daily.  - Continue digoxin 0.125 mg daily - Continue spironolactone 25 mg daily.  - Continue ivabradine 5 mg BID. - losartan decreased to 25 mg daily 02/20/18. Will not switch to [facility] for now with borderline high K and intermittent lightheadedness   3. Atrial fibrillation: Paroxysmal.  Remains in NSR.  CHA2DS2/VASc is at least 4. (CHF, Vasc disease, HTN, Female).   - She has history of Afib RVR in the past in the setting of her Lung CA. Previously on Xarelto but stopped her medications. Dr. Roxy Manns OK.  - Continue amiodarone 200 mg BID.  - Will continue ASA and Plavix per discussion with Dr. [name] and Dr. Roxy Manns.  4. H/o SCLC: s/p  treatment 2015. Lost to f/u since 04/2015. Will need f/u with oncology (Dr. Julien Nordmann) as outpatient.  - Chest CT 02/19/42 with mass-like consolidation in R hilum concerning for recurrent tumor.  - Speech to evaluate today. If concerns for RLN palsy with h/o XRT, will do Chest CT w/ contrast. If stable, will plan to follow as outpatient in cancer center.  5. Anemia:  - No overt bleeding. CBC pending.  6. Aortic insufficiency: Moderate-severe on postop echo.  Likely potentiating CHF.   - Added afterload reduction with ARB. No change.  7. ID: Covering for PNA with vancomycin/ceftazidime. - Vanc stopped 02/17/18. - Psuedomonas in urine. Finished ceftaz 02/20/18 - WBC remains elevated. Afebrile. Encouraged IS.   Nearing d/c  to SNF. Speech to evaluate swallowing for need for further work-up of possible malignancy. 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   Length of Stay: [place], [place]  02/21/2018, 10:54 AM  Advanced Heart Failure Team Pager [phone] (M-F; 7a - 4p)  Please contact [facility] for night-coverage after hours (4p -7a ) and weekends on [email or web address]  Patient seen and examined with the above-signed Advanced Practice Provider and/or Housestaff. I personally reviewed laboratory data, imaging studies and relevant notes. I independently examined the patient and formulated the important aspects of the plan. I have edited the note to reflect any of my changes or salient points. I have personally discussed the plan with the patient and/or family.  Still sore and fatigued. BP remains soft. Remains on dysphagia diet. ? Possible recurrent laryngeal nerve trauma. Speech to evaluate. Remains in NSR can drop amio to 200 daily. BP too soft to titrate HF meds.  Ok for d/c to SNF from our standpoint. Will need outpatient f/u with [facility].   Glori Bickers, MD  10:35 PM

## 2018-02-21 NOTE — Care Management Note (Signed)
  Case Management Note Previous CM note completed by Brittany Labrador, RN 02/18/2018, 4:10 PM    Patient Details  Name: Brittany [name] MRN: [id number] Date of Birth: [birth year]  Subjective/Objective: Pt is now s/p CABG and s/p removal of both Balloon pump and Impella                   Action/Plan:   PTA independent from home - pt's adult son stays in the home with her.  Pt has PCP and denied barriers to obtaining/medications.  Pt plans on staying with family in [place] for approximately 3 weeks at discharge.  Pt still requiring BIPAP but will need PT eval once stable.   Expected Discharge Date:                  Expected Discharge Plan:  [place]  In-House Referral:  Clinical Social Work  Discharge planning Services  CM Consult, Medication Assistance  Post Acute Care Choice:    Choice offered to:     DME Arranged:    DME Agency:     HH Arranged:    HH Agency:     Status of Service:  In process, will continue to follow  If discussed at Long Length of Stay Meetings, dates discussed:  3/12, 3/14  Discharge Disposition:   Additional Comments:   02/21/18- 1215- Brittany Gibbons RN, CM- S/p CABG on 02/03/18- pt continues to need Bipap intermittently-  Per insurance check on Corlanor- Co-pay amount for" Corlanor" 5 mg BID $45.00 30 day supply.  No PA required.  [employer] wants patient to call in and give them the correct phone number ans address.  PH# for [employer] is : [phone].  CM will provide info to pt for follow up with her insurance-  CSW following for STSNF placement   Brittany Labrador, RN 02/18/2018, 4:10 PM--CTCS request for pt to discharge to local SNF to ensure continuation of care.  CM completed Corlanor Copay Card online at bedside based on information provided by pt.  CM provided temporary card to bedside nurse and requested that paper be transported with pt at discharge.  CM also informed pt that paperwork will be  given to her at discharge.  CSW following for placement  02/15/18--Brittany [name] Pts resp status remains tenous - remains on BIPAP or HFNC.  Undergoing VAD work up   Brittany [name], [employer], RN 02/21/2018, 12:17 PM [phone] 4E Transition Care Coordinator

## 2018-02-21 NOTE — Progress Notes (Addendum)
[address]       [employer] [id number]             [phone]      13 Days Post-Op Procedure(s) (LRB): REMOVAL OF IMPELLA LEFT VENTRICULAR ASSIST DEVICE (N/A) TRANSESOPHAGEAL ECHOCARDIOGRAM (TEE) (N/A) Subjective: Soreness a little worse, but not too bad, + DOE, improving  Objective: Vital signs in last 24 hours: Temp:  [97.8 F (36.6 C)-98.7 F (37.1 C)] 98.1 F (36.7 C) (03/14 0442) Pulse Rate:  [84-88] 86 (03/14 0442) Cardiac Rhythm: Normal sinus rhythm (03/13 2005) Resp:  [18-22] 18 (03/13 2355) BP: (109-125)/(66-80) 109/66 (03/14 0442) SpO2:  [100 %] 100 % (03/14 0442) Weight:  [102 lb 14.4 oz (46.7 kg)] 102 lb 14.4 oz (46.7 kg) (03/14 0442)  Hemodynamic parameters for last 24 hours:    Intake/Output from previous day: 03/13 0701 - 03/14 0700 In: 487 [P.O.:477; I.V.:10] Out: -  Intake/Output this shift: No intake/output data recorded.  General appearance: alert, cooperative and no distress Heart: regular rate and rhythm Lungs: coarse throughout Abdomen: benign Extremities: no edema Wound: incis healing well  Lab Results: Recent Labs    02/19/18 0347 02/20/18 0354  WBC 14.5* 14.5*  HGB 9.9* 9.8*  HCT 31.0* 30.8*  PLT 432* 435*   BMET:  Recent Labs    02/19/18 0347 02/20/18 0354  NA 136 136  K 3.9 4.7  CL 98* 101  CO2 29 28  GLUCOSE 200* 156*  BUN 16 17  CREATININE 0.80 0.82  CALCIUM 8.8* 9.1    PT/INR: No results for input(s): LABPROT, INR in the last 72 hours. ABG    Component Value Date/Time   PHART 7.433 02/09/2018 0415   HCO3 28.0 02/09/2018 0415   TCO2 40 (H) 02/16/2018 1435   ACIDBASEDEF 1.0 02/08/2018 2117   O2SAT 66.0 02/19/2018 0345   CBG (last 3)  No results for input(s): GLUCAP in the last 72 hours.  Meds Scheduled Meds: . amiodarone  200 mg Oral BID  . aspirin EC  81 mg Oral Daily  . atorvastatin  80 mg Oral q1800  . Chlorhexidine Gluconate Cloth  6 each Topical Daily  . clopidogrel  75 mg  Oral Daily  . digoxin  0.0625 mg Oral Daily  . enoxaparin (LOVENOX) injection  40 mg Subcutaneous Q24H  . feeding supplement (ENSURE ENLIVE)  237 mL Oral TID BM  . furosemide  40 mg Oral Daily  . ipratropium-albuterol  3 mL Nebulization BID  . ivabradine  5 mg Oral BID WC  . losartan  25 mg Oral Daily  . mouth rinse  15 mL Mouth Rinse BID  . pantoprazole  40 mg Oral Daily  . sodium chloride flush  10-40 mL Intracatheter Q12H  . spironolactone  25 mg Oral Daily   Continuous Infusions: . sodium chloride Stopped (02/18/18 1540)   PRN Meds:.ALPRAZolam, bisacodyl **OR** bisacodyl, docusate sodium, levalbuterol, metoprolol tartrate, morphine injection, ondansetron (ZOFRAN) IV, oxyCODONE, RESOURCE THICKENUP CLEAR, sodium chloride flush, traMADol  Xrays Dg Chest 2 View  Result Date: 02/20/2018 CLINICAL DATA:  Congestive heart failure. Recent coronary artery bypass grafting EXAM: CHEST - 2 VIEW COMPARISON:  February 18, 2018 chest radiograph and chest CT February 19, 2018 FINDINGS: Central catheter tip is in superior vena cava. No pneumothorax. There is a small right pleural effusion. There is perihilar interstitial edema. There is consolidation in the right perihilar region, better demonstrated on recent CT. Lungs elsewhere are clear. Heart is mildly enlarged  with pulmonary vascularity within normal limits. No adenopathy identified beyond potential adenopathy in the right hilar region. Patient is status post coronary artery bypass grafting. No evident bone lesions. IMPRESSION: Consolidation right perihilar region. Suspect pneumonia. Underlying mass cannot be excluded by radiography in this area. There is perihilar interstitial edema, stable. Small right pleural effusion. Stable cardiomegaly. Central catheter tip in superior vena cava without pneumothorax. Electronically Signed   By: Lowella Grip III M.D.   On: 02/20/2018 07:58   Ct Chest Wo Contrast  Result Date: 02/19/2018 CLINICAL DATA:  Inpatient  admitted with acute systolic CHF requiring emergent CABG 02/03/2018. 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 EXAM: CT CHEST WITHOUT CONTRAST TECHNIQUE: Multidetector CT imaging of the chest was performed following the standard protocol without IV contrast. COMPARISON:  04/21/2015 chest CT. Chest radiograph from one day prior. FINDINGS: Cardiovascular: Mild cardiomegaly. Trace pericardial effusion/thickening. Interval postsurgical changes from CABG. Epicardial pacer leads are noted in the right anterior and inferior pericardium. Left PICC terminates in the right atrium. Atherosclerotic nonaneurysmal thoracic aorta. Top-normal main pulmonary artery diameter 3.1 cm. Mediastinum/Nodes: No discrete thyroid nodules. Unremarkable esophagus. No axillary adenopathy. No discrete enlarged mediastinal or left hilar nodes on this noncontrast scan. Lungs/Pleura: No pneumothorax. Small dependent bilateral pleural effusions. Mild-to-moderate centrilobular emphysema. There is masslike consolidation in the perihilar right lung measuring 8.9 x 4.4 cm (series 4/image 66), which demonstrates a relatively sharp lateral margin, which is confluent with the right hilum and subcarinal and lower right paratracheal mediastinum, and which is new compared to the most recent chest CT comparison study of 04/21/2015. There is patchy ground-glass attenuation and interlobular septal thickening throughout both lungs. Mild dependent atelectasis in both lower lobes. No significant pulmonary nodules in the aerated portions of the lungs. Upper abdomen: No acute abnormality. Musculoskeletal: No aggressive appearing focal osseous lesions. Intact sternotomy wires. IMPRESSION: 1. Large masslike focus of consolidation in the perihilar right lung is nonspecific and new since the most recent comparison chest CT of  04/21/2015. Findings could represent any combination of perihilar pneumonia, recurrent tumor, atelectasis and/or delayed radiation change. Short-term follow-up chest CT advised, preferably with IV contrast. 2. Suspect superimposed mild congestive heart failure with small dependent bilateral pleural effusions. Aortic Atherosclerosis (ICD10-I70.0) and Emphysema (ICD10-J43.9). Electronically Signed   By: Ilona Sorrel M.D.   On: 02/19/2018 14:39    Assessment/Plan: S/P Procedure(s) (LRB): REMOVAL OF IMPELLA LEFT VENTRICULAR ASSIST DEVICE (N/A) TRANSESOPHAGEAL ECHOCARDIOGRAM (TEE) (N/A)  1 conts to progress slowly but well 2 sinus rhythm, stable hemodynamics, AHF team managing 3 Renal fxn remains normal 4 Leukocytosis stable with WBC 14.5- no fevers, conts fortaz for HCAP, CT scan of chest results noted 5 H/H stable 6 blood sugars persist elevated- will check hgb A1c- no h/o DM/previous meds- hopefully diet control long term 7 on D3 diet 8 SW is assisting with disposition to rehab facility- slowly getting closer to discharge  LOS: 18 days    John Giovanni 02/21/2018   I have seen and examined the patient and agree with the assessment and plan as outlined.   Await f/u from SLP team regarding swallowing and dietary restrictions.  Patient still on "nectar thick" liquids restriction.  Patient reports that she frequently developed hoarseness of voice PTA, particularly associated with coughing.  Given her h/o small cell CA and previous XRT she might have significant pre-existing RLN palsey.  CT chest w/out contrast performed 2 days ago not adequate for f/u of cancer.  However, repeat scan can wait unless SLP concerned about possible RLN palsey.  If clinical exam suggestive of RLN palsey then her scan should be repeated with IV contrast - PE protocol.  Otherwise she can f/u in cancer center and have repeat CT w/ IV contrast at that time.  Under the circumstances she will likely need to remain in the  hospital until SLP has cleared her to advance her diet to include thin liquids or otherwise addressed this issue.  Otherwise patient looks to be close to being ready for d/c to SNF for further rehab.   Rexene Alberts, MD 02/21/2018 9:29 AM

## 2018-02-21 NOTE — Clinical Social Work Note (Signed)
Clinical Social Work Assessment  Patient Details  Name: Brittany [name] MRN: [id number] Date of Birth: July 11, 1961  Date of referral:  02/21/18               Reason for consult:  Discharge Planning, Facility Placement                Permission sought to share information with:  Family Supports Permission granted to share information::  Yes, Verbal Permission Granted  Name::     Michell Heinrich  Agency::  snf  Relationship::  spouse  Contact Information:  [phone]  Housing/Transportation Living arrangements for the past 2 months:  [place] of Information:  Patient Patient Interpreter Needed:  None Criminal Activity/Legal Involvement Pertinent to Current Situation/Hospitalization:  No - Comment as needed Significant Relationships:  Adult Children, Spouse, Other Family Members, Siblings Lives with:  Spouse Do you feel safe going back to the place where you live?  Yes Need for family participation in patient care:  Yes (Comment)  Care giving concerns:  No family at bedside. Patient stated she lives with spouse but stated husband is currently traveling for work. Patient stated she has support from adult son and siblings   Facilities manager / plan: CSW met patient at bedside to offer support and discuss discharge plan. Patient gave CSW to speak to her but stated she just took some pain medication so she feels sleepy. Patient stated she feel that she would benefit more from discharging to SNF for short term rehab. Patient requested CSW explain the process of SNF since she has never been to a facility in the past. CSW explained the process of SNF and patient stated she understands. Patient has given CSW verbal permission to fax patient information to SNF in the area. CSW to follow up with patient once bed offer have been made.  Employment status:  Unemployed Forensic scientist:  Other (Comment Required)(BC/BS) PT Recommendations:  [facility] / Referral to community resources:  [place]  Patient/Family's Response to care:  Patient is very agreeable with medical teams recommendations of going to rehab  Patient/Family's Understanding of and Emotional Response to Diagnosis, Current Treatment, and Prognosis:  Patient is very acknowledgeable  of current diagnoses and stated is willing to do what is needed to be able to get back home.    Emotional Assessment Appearance:  Appears stated age Attitude/Demeanor/Rapport:  Engaged Affect (typically observed):  Accepting Orientation:  Oriented to Self, Oriented to  Time, Oriented to Place, Oriented to Situation Alcohol / Substance use:  Not Applicable Psych involvement (Current and /or in the community):  No (Comment)  Discharge Needs  Concerns to be addressed:  No discharge needs identified Readmission within the last 30 days:  No Current discharge risk:  None Barriers to Discharge:  No Barriers Identified   Wende Neighbors, LCSW 02/21/2018, 2:08 PM

## 2018-02-21 NOTE — NC FL2 (Signed)
[place] LEVEL OF CARE SCREENING TOOL     IDENTIFICATION  Patient Name: Brittany [name] Birthdate: 06-05-1961 Sex: female Admission Date (Current Location): 02/03/2018  [facility] and [place] Number:  Herbalist and Address:  [facility] [place], [place] [address], [place], [place] [address]      Provider Number: [id number]  Attending Physician Name and Address:  Rexene Alberts, MD  Relative Name and Phone Number:       Current Level of Care: Hospital Recommended Level of Care: [place] Prior Approval Number:    Date Approved/Denied:   PASRR Number: [id number]  Discharge Plan:      Current Diagnoses: Patient Active Problem List   Diagnosis Date Noted  . Acute respiratory failure with hypoxia ([place])   . STEMI involving left anterior descending coronary artery ([place]) 02/03/2018  . Cardiogenic shock ([place]) 02/03/2018  . Coronary artery disease involving native coronary artery of native heart with unstable angina pectoris ([place]) 02/03/2018  . Acute systolic congestive heart failure ([place]) 02/03/2018  . S/P CABG (coronary artery bypass graft) 02/03/2018  . Prophylactic measure   . Hypertension   . Asthma   . Anxiety   . Allergy   . CAP (community acquired pneumonia) 03/15/2015  . Chest pain 03/15/2015  . Pneumonia 03/15/2015  . Lung cancer ([place])   . Small cell lung cancer ([place]) 03/16/2014  . Protein-calorie malnutrition, severe ([place]) 03/14/2014  . Atrial fibrillation ([place]) 03/12/2014  . SVT (supraventricular tachycardia) ([place]) 03/11/2014  . Lung mass 03/10/2014  . HTN (hypertension) 05/07/2012  . Asthma 05/07/2012    Orientation RESPIRATION BLADDER Height & Weight     Self, Time, Situation, Place  Normal Incontinent, External catheter Weight: 102 lb 14.4 oz (46.7 kg) Height:  5\' 1"  (154.9 cm)  BEHAVIORAL SYMPTOMS/MOOD NEUROLOGICAL BOWEL NUTRITION STATUS      Continent Diet(see DC summary)  AMBULATORY  STATUS COMMUNICATION OF NEEDS Skin   Extensive Assist Verbally                         Personal Care Assistance Level of Assistance  Bathing, Dressing Bathing Assistance: Maximum assistance   Dressing Assistance: Maximum assistance     Functional Limitations Info  Sight, Hearing, Speech Sight Info: Adequate Hearing Info: Adequate Speech Info: Adequate    SPECIAL CARE FACTORS FREQUENCY  PT (By licensed PT), OT (By licensed OT)     PT Frequency: 5/wk OT Frequency: 5/wk            Contractures Contractures Info: Not present    Additional Factors Info  Code Status, Allergies Code Status Info: FULL Allergies Info: Iodinated Diagnostic Agents, Latex, Pineapple, Sulfa Antibiotics, Codeine           Current Medications (02/21/2018):  This is the current hospital active medication list Current Facility-Administered Medications  Medication Dose Route Frequency Provider Last Rate Last Dose  . 0.9 %  sodium chloride infusion   Intravenous Continuous Rexene Alberts, MD   Stopped at 02/18/18 1540  . ALPRAZolam Duanne Moron) tablet 0.25 mg  0.25 mg Oral BID PRN Ivin Poot, MD   0.25 mg at 02/20/18 2145  . amiodarone (PACERONE) tablet 200 mg  200 mg Oral BID Bensimhon, Shaune Pascal, MD   200 mg at 02/21/18 1102  . aspirin EC tablet 81 mg  81 mg Oral Daily Rexene Alberts, MD   81 mg at 02/21/18 1102  . atorvastatin (LIPITOR)  tablet 80 mg  80 mg Oral q1800 Rexene Alberts, MD   80 mg at 02/20/18 1632  . bisacodyl (DULCOLAX) EC tablet 10 mg  10 mg Oral Daily PRN Rexene Alberts, MD       Or  . bisacodyl (DULCOLAX) suppository 10 mg  10 mg Rectal Daily PRN Rexene Alberts, MD      . Chlorhexidine Gluconate Cloth 2 % PADS 6 each  6 each Topical Daily Rexene Alberts, MD   6 each at 02/20/18 [phone]  . clopidogrel (PLAVIX) tablet 75 mg  75 mg Oral Daily Rexene Alberts, MD   75 mg at 02/21/18 1102  . digoxin (LANOXIN) tablet 0.0625 mg  0.0625 mg Oral Daily Shirley Friar,  PA-C   0.0625 mg at 02/21/18 1103  . docusate sodium (COLACE) capsule 200 mg  200 mg Oral Daily PRN Rexene Alberts, MD   200 mg at 02/18/18 0957  . enoxaparin (LOVENOX) injection 40 mg  40 mg Subcutaneous Q24H Rexene Alberts, MD   40 mg at 02/21/18 1103  . feeding supplement (ENSURE ENLIVE) (ENSURE ENLIVE) liquid 237 mL  237 mL Oral TID BM Rexene Alberts, MD   237 mL at 02/20/18 2145  . furosemide (LASIX) tablet 40 mg  40 mg Oral Daily Larey Dresser, MD   40 mg at 02/21/18 1102  . ipratropium-albuterol (DUONEB) 0.5-2.5 (3) MG/3ML nebulizer solution 3 mL  3 mL Nebulization BID Rexene Alberts, MD   3 mL at 02/21/18 0834  . ivabradine (CORLANOR) tablet 5 mg  5 mg Oral BID WC Bensimhon, Shaune Pascal, MD   5 mg at 02/21/18 2202  . levalbuterol (XOPENEX) nebulizer solution 0.63 mg  0.63 mg Nebulization Q4H PRN Melrose Nakayama, MD   0.63 mg at 02/16/18 0151  . losartan (COZAAR) tablet 25 mg  25 mg Oral Daily Shirley Friar, PA-C   25 mg at 02/21/18 1102  . MEDLINE mouth rinse  15 mL Mouth Rinse BID Rexene Alberts, MD   15 mL at 02/20/18 2145  . metoprolol tartrate (LOPRESSOR) injection 2.5-5 mg  2.5-5 mg Intravenous Q2H PRN Rexene Alberts, MD      . morphine 4 MG/ML injection 1-2 mg  1-2 mg Intravenous Q1H PRN Rexene Alberts, MD   2 mg at 02/11/18 2038  . ondansetron (ZOFRAN) injection 4 mg  4 mg Intravenous Q6H PRN Rexene Alberts, MD   4 mg at 02/11/18 0327  . oxyCODONE (Oxy IR/ROXICODONE) immediate release tablet 5-10 mg  5-10 mg Oral Q3H PRN Rexene Alberts, MD   5 mg at 02/21/18 1102  . pantoprazole (PROTONIX) EC tablet 40 mg  40 mg Oral Daily Rexene Alberts, MD   40 mg at 02/21/18 1103  . RESOURCE THICKENUP CLEAR   Oral PRN Bensimhon, Shaune Pascal, MD      . sodium chloride flush (NS) 0.9 % injection 10-40 mL  10-40 mL Intracatheter Q12H Rexene Alberts, MD   10 mL at 02/20/18 0955  . sodium chloride flush (NS) 0.9 % injection 10-40 mL  10-40 mL Intracatheter PRN Rexene Alberts, MD      . spironolactone (ALDACTONE) tablet 25 mg  25 mg Oral Daily Rexene Alberts, MD   25 mg at 02/21/18 1101  . traMADol (ULTRAM) tablet 50-100 mg  50-100 mg Oral Q4H PRN Rexene Alberts, MD   100 mg at 02/15/18 1005   Facility-Administered Medications  Ordered in Other Encounters  Medication Dose Route Frequency Provider Last Rate Last Dose  . etomidate (AMIDATE) injection    Anesthesia Intra-op [name], CRNA   12 mg at 02/03/18 1130  . succinylcholine (ANECTINE) injection    Anesthesia Intra-op [name], CRNA   60 mg at 02/03/18 1130     Discharge Medications: Please see discharge summary for a list of discharge medications.  Relevant Imaging Results:  Relevant Lab Results:   Additional Information SS#: [id number]  Wende Neighbors, LCSW

## 2018-02-22 ENCOUNTER — Inpatient Hospital Stay (HOSPITAL_COMMUNITY): Payer: BLUE CROSS/BLUE SHIELD

## 2018-02-22 LAB — BASIC METABOLIC PANEL
Anion gap: 9 (ref 5–15)
BUN: 19 mg/dL (ref 6–20)
CHLORIDE: 98 mmol/L — AB (ref 101–111)
CO2: 28 mmol/L (ref 22–32)
Calcium: 9.1 mg/dL (ref 8.9–10.3)
Creatinine, Ser: 0.81 mg/dL (ref 0.44–1.00)
GFR calc Af Amer: >60 mL/min (ref >60)
GFR calc non Af Amer: >60 mL/min (ref >60)
Glucose, Bld: 162 mg/dL — ABNORMAL HIGH (ref 65–99)
POTASSIUM: 4.9 mmol/L (ref 3.5–5.1)
Sodium: 135 mmol/L (ref 135–145)

## 2018-02-22 NOTE — Plan of Care (Signed)
  Progressing Education: Knowledge of General Education information will improve 02/22/2018 0024 - Progressing by Drenda Freeze, RN Health Behavior/Discharge Planning: Ability to manage health-related needs will improve 02/22/2018 0024 - Progressing by Drenda Freeze, RN Clinical Measurements: Ability to maintain clinical measurements within normal limits will improve 02/22/2018 0024 - Progressing by Drenda Freeze, RN Will remain free from infection 02/22/2018 0024 - Progressing by Drenda Freeze, RN

## 2018-02-22 NOTE — Progress Notes (Addendum)
Patient ID: Brittany [name], female   DOB: [birth year], 57 y.o.   MRN: [id number]      Advanced Heart Failure Rounding Note  PCP-Cardiologist: No primary care provider on file.   Subjective:    Events S/p emergent CABG 02/03/18 IABP removed 02/04/18 Impella out 02/08/18 Extubated 02/08/18  Feeling great today.  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 Regardless, her diet has been advanced and she is very happy. Discharge pending placement.   Creatinine stable. K 4.9.   ECHO 3/4 EF 25-30%. Grade II DD. Moderate-severe aortic insufficiency.   TEE 02/03/18 LVEF 20%, Mild to Mod AR, Mild/Mod MR, RV normal.   Objective:   Weight Range: 117 lb 1 oz (53.1 kg) Body mass index is 22.12 kg/m.   Vital Signs:   Temp:  [97.9 F (36.6 C)-98.3 F (36.8 C)] 97.9 F (36.6 C) (03/15 0300) Pulse Rate:  [73-81] 79 (03/15 0300) Resp:  [14-15] 15 (03/15 0300) BP: (98-122)/(61-96) 98/68 (03/15 0300) SpO2:  [98 %-100 %] 98 % (03/15 0300) Weight:  [117 lb 1 oz (53.1 kg)] 117 lb 1 oz (53.1 kg) (03/15 0300) Last BM Date: 02/19/18  Weight change: Filed Weights   02/20/18 0603 02/21/18 0442 02/22/18 0300  Weight: 104 lb 4.4 oz (47.3 kg) 102 lb 14.4 oz (46.7 kg) 117 lb 1 oz (53.1 kg)    Intake/Output:   Intake/Output Summary (Last 24 hours) at 02/22/2018 1105 Last data filed at 02/22/2018 0610 Gross per 24 hour  Intake 120 ml  Output 1200 ml  Net -1080 ml    Physical Exam   CVP disconnected.  General: Fatigued. No resp difficulty.  HEENT: Normal Neck: Supple. JVP 6-7 cm. Carotids 2+ bilat; no bruits. No thyromegaly or nodule noted. Cor: PMI nondisplaced. RRR, 1/6 SEM RUSB.  Lungs: CTAB, normal effort. Abdomen: Soft, non-tender, non-distended, no HSM. No bruits or masses. +BS  Extremities: No cyanosis, clubbing, or rash. R and LLE no edema.  Neuro: Alert & orientedx3, cranial nerves grossly intact. moves all 4 extremities w/o  difficulty. Affect pleasant   Telemetry   NSR 80-90s, personally reviewed.  Labs    CBC Recent Labs    02/20/18 0354 02/21/18 1120  WBC 14.5* 13.0*  NEUTROABS  --  10.6*  HGB 9.8* 10.0*  HCT 30.8* 31.2*  MCV 95.1 94.5  PLT 435* 474*   Basic Metabolic Panel Recent Labs    02/21/18 1120 02/22/18 0530  NA 134* 135  K 4.9 4.9  CL 98* 98*  CO2 26 28  GLUCOSE 234* 162*  BUN 20 19  CREATININE 0.85 0.81  CALCIUM 9.1 9.1   Liver Function Tests No results for input(s): AST, ALT, ALKPHOS, BILITOT, PROT, ALBUMIN in the last 72 hours. No results for input(s): LIPASE, AMYLASE in the last 72 hours. Cardiac Enzymes No results for input(s): CKTOTAL, CKMB, CKMBINDEX, TROPONINI in the last 72 hours.  BNP: BNP (last 3 results) Recent Labs    02/03/18 0853 02/15/18 0449  BNP 3,127.4* 2,526.7*    ProBNP (last 3 results) No results for input(s): PROBNP in the last 8760 hours.   D-Dimer No results for input(s): DDIMER in the last 72 hours. Hemoglobin A1C Recent Labs    02/21/18 0842  HGBA1C 5.6   Fasting Lipid Panel No results for input(s): CHOL, HDL, LDLCALC, TRIG, CHOLHDL, LDLDIRECT in the last 72 hours. Thyroid Function Tests No results for input(s): TSH, T4TOTAL, T3FREE, THYROIDAB in the last 72 hours.  Invalid input(s):  FREET3  Other results:   Imaging    No results found.   Medications:     Scheduled Medications: . amiodarone  200 mg Oral Daily  . aspirin EC  81 mg Oral Daily  . atorvastatin  80 mg Oral q1800  . Chlorhexidine Gluconate Cloth  6 each Topical Daily  . clopidogrel  75 mg Oral Daily  . digoxin  0.0625 mg Oral Daily  . enoxaparin (LOVENOX) injection  40 mg Subcutaneous Q24H  . feeding supplement (ENSURE ENLIVE)  237 mL Oral TID BM  . furosemide  40 mg Oral Daily  . ipratropium-albuterol  3 mL Nebulization BID  . ivabradine  5 mg Oral BID WC  . losartan  25 mg Oral Daily  . mouth rinse  15 mL Mouth Rinse BID  . pantoprazole  40 mg  Oral Daily  . sodium chloride flush  10-40 mL Intracatheter Q12H  . spironolactone  25 mg Oral Daily    Infusions: . sodium chloride Stopped (02/18/18 1540)    PRN Medications: ALPRAZolam, bisacodyl **OR** bisacodyl, docusate sodium, levalbuterol, metoprolol tartrate, morphine injection, ondansetron (ZOFRAN) IV, oxyCODONE, RESOURCE THICKENUP CLEAR, sodium chloride flush, traMADol  Patient Profile   Brittany [name] is a 57 y/o woman with a history of PAF, preveious smoker quit 4 years ago, hypertension, 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 She was admitted with NSTEMI and shock.  Emergent cath 02/03/18 showed LAD 100% stenosed, LCx 95% stenosed. Taken for emergent CABG 02/03/18  Assessment/Plan   1. 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  Cath with severe 2v CAD as above with severe LV dysfunction/ICM. s/p Emergent CABG 02/03/18.   - No s/s of ischemia.    - On aspirin, plavix and atorvastatin.  2. Acute systolic HF -> cardiogenic shock in setting of #1: TEE 02/03/18 LVEF 20%, Mild to Mod AR, Mild/Mod MR, RV normal. Impella out 3/1.  Echo 3/4 with moderate-severe aortic insufficiency which is likely contributing to CHF.  - No longer following CVP/Coox.  - Will plan to pull PICC prior to discharge. Leave in for now to avoid frequent sticks.  - Volume status stable on lasix 40 mg daily.  - Continue digoxin 0.125 mg daily - Continue spironolactone 25 mg daily.  - Continue ivabradine 5 mg BID. - Continue losartan 25 mg daily. No room to increase with borderline high K and intermittent lightheadedness   3. Atrial fibrillation: Paroxysmal.  Remains in NSR.  CHA2DS2/VASc is at least 4. (CHF, Vasc disease, HTN, Female).   - She has history of Afib RVR in the past in the setting of her Lung CA. Previously on Xarelto but stopped her medications. Dr. Roxy Manns OK.  - Continue  amiodarone 200 mg BID.  - Will continue ASA and Plavix per discussion with Dr. [name] and Dr. Roxy Manns.  4. 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 Will need f/u with oncology (Dr. Julien Nordmann) as outpatient.  - Chest CT 02/19/42 with mass-like consolidation in R hilum concerning for recurrent tumor.  - Speech to evaluate today. If concerns for RLN palsy with h/o XRT, will do Chest CT w/ contrast.  - She will need outpatient follow up in the cancer center.  5. Anemia:  - Hgb stable.   6. Aortic insufficiency: Moderate-severe on postop echo.  Likely potentiating CHF.   - Added afterload reduction with ARB. No change.  7. ID: Covering  for PNA with vancomycin/ceftazidime. - Vanc stopped 02/17/18. - Psuedomonas in urine. Finished ceftaz 02/20/18 - WBC have been trending down. Afebrile. Encouraged IS.  8. Dysphagia - Swallow study formal results pending. 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  - Diet advanced this am.   To SNF once placed.  Would have SNF get BMET next week with borderline elevated K.   HF meds for discharge Amiodarone 200 mg BID Lasix 40 mg daily Digoxin 0.125 mg daily Spiro 25 mg daily Ivabridine 5 mg daily Losartan 25 mg daily ASA 81 mg daily Plavix 75 mg daily   Will schedule close HF follow up.   Length of Stay: [place], [place]  02/22/2018, 11:05 AM  Advanced Heart Failure Team Pager [phone] (M-F; 7a - 4p)  Please contact [facility] for night-coverage after hours (4p -7a ) and weekends on [email or web address]  Patient seen and examined with the above-signed Advanced Practice Provider and/or Housestaff. I personally reviewed laboratory data, imaging studies and relevant notes. I independently examined the patient and formulated the important aspects of the plan. I have edited the note to reflect any of my changes or salient points. I have personally discussed the plan with the patient and/or family.  Remains stable from cardiac  standpoint. Ready for d/c to SNF. 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 Agree with meds as above. Will need echo as outpatient to re-evaluate AI.   We will see again on Monday if she is still here.   Glori Bickers, MD  4:07 PM

## 2018-02-22 NOTE — Progress Notes (Addendum)
[place] SpringsSuite 411       [place] [address]             [phone]      14 Days Post-Op Procedure(s) (LRB): REMOVAL OF IMPELLA LEFT VENTRICULAR ASSIST DEVICE (N/A) TRANSESOPHAGEAL ECHOCARDIOGRAM (TEE) (N/A) Subjective: Feels better each day  Objective: Vital signs in last 24 hours: Temp:  [97.9 F (36.6 C)-98.3 F (36.8 C)] 97.9 F (36.6 C) (03/15 0300) Pulse Rate:  [73-81] 79 (03/15 0300) Cardiac Rhythm: Normal sinus rhythm (03/14 2000) Resp:  [14-15] 15 (03/15 0300) BP: (98-122)/(61-96) 98/68 (03/15 0300) SpO2:  [98 %-100 %] 98 % (03/15 0300) Weight:  [117 lb 1 oz (53.1 kg)] 117 lb 1 oz (53.1 kg) (03/15 0300)  Hemodynamic parameters for last 24 hours:    Intake/Output from previous day: 03/14 0701 - 03/15 0700 In: 245 [P.O.:125; I.V.:120] Out: 1200 [Urine:1200] Intake/Output this shift: No intake/output data recorded.  General appearance: alert, cooperative and no distress Heart: regular rate and rhythm Lungs: coarse ronchi in upper airways, improves with cough Abdomen: benign Extremities: no edema Wound: incis healing well  Lab Results: Recent Labs    02/20/18 0354 02/21/18 1120  WBC 14.5* 13.0*  HGB 9.8* 10.0*  HCT 30.8* 31.2*  PLT 435* 413*   BMET:  Recent Labs    02/21/18 1120 02/22/18 0530  NA 134* 135  K 4.9 4.9  CL 98* 98*  CO2 26 28  GLUCOSE 234* 162*  BUN 20 19  CREATININE 0.85 0.81  CALCIUM 9.1 9.1    PT/INR: No results for input(s): LABPROT, INR in the last 72 hours. ABG    Component Value Date/Time   PHART 7.433 02/09/2018 0415   HCO3 28.0 02/09/2018 0415   TCO2 40 (H) 02/16/2018 1435   ACIDBASEDEF 1.0 02/08/2018 2117   O2SAT 66.0 02/19/2018 0345   CBG (last 3)  No results for input(s): GLUCAP in the last 72 hours.  Meds Scheduled Meds: . amiodarone  200 mg Oral Daily  . aspirin EC  81 mg Oral Daily  . atorvastatin  80 mg Oral q1800  . Chlorhexidine Gluconate Cloth  6 each Topical Daily  .  clopidogrel  75 mg Oral Daily  . digoxin  0.0625 mg Oral Daily  . enoxaparin (LOVENOX) injection  40 mg Subcutaneous Q24H  . feeding supplement (ENSURE ENLIVE)  237 mL Oral TID BM  . furosemide  40 mg Oral Daily  . ipratropium-albuterol  3 mL Nebulization BID  . ivabradine  5 mg Oral BID WC  . losartan  25 mg Oral Daily  . mouth rinse  15 mL Mouth Rinse BID  . pantoprazole  40 mg Oral Daily  . sodium chloride flush  10-40 mL Intracatheter Q12H  . spironolactone  25 mg Oral Daily   Continuous Infusions: . sodium chloride Stopped (02/18/18 1540)   PRN Meds:.ALPRAZolam, bisacodyl **OR** bisacodyl, docusate sodium, levalbuterol, metoprolol tartrate, morphine injection, ondansetron (ZOFRAN) IV, oxyCODONE, RESOURCE THICKENUP CLEAR, sodium chloride flush, traMADol  Xrays No results found.  Assessment/Plan: S/P Procedure(s) (LRB): REMOVAL OF IMPELLA LEFT VENTRICULAR ASSIST DEVICE (N/A) TRANSESOPHAGEAL ECHOCARDIOGRAM (TEE) (N/A)  1  conts to have steady overall improvement. SR , on DAPT 2 says she is drinking water well and doesn't like thickened liquids- will ask ST to see patient again re; Dr Guy Sandifer note yesterday 3 HG A1C 5.6 with mod elevated BS , with current nutrition issues I wouldn't start metformin at this time but will need long  term follow up 4 AHF team conts to manage heart failure 5 cont pulm toilet- off ABX 6 SW assisting with placement   LOS: 19 days    Brittany [name] 02/22/2018  I have seen and examined the patient and agree with the assessment and plan as outlined.  Ready for d/c to SNF once swallowing matters resolved.  Rexene Alberts, MD 02/22/2018 9:38 AM

## 2018-02-22 NOTE — Progress Notes (Signed)
CARDIAC REHAB PHASE I   Ed completed with pt. Voiced reception. She is anxiously awaiting her meal. To walk with PT after she eats. Will refer to [place]. Discussed HF management as well. Cool, ACSM 02/22/2018 3:42 PM

## 2018-02-22 NOTE — Discharge Instructions (Signed)
Heart Failure °Heart failure means your heart has trouble pumping blood. This makes it hard for your body to work well. Heart failure is usually a long-term (chronic) condition. You must take good care of yourself and follow your doctor's treatment plan. °Follow these instructions at home: °· Take your heart medicine as told by your doctor. °? Do not stop taking medicine unless your doctor tells you to. °? Do not skip any dose of medicine. °? Refill your medicines before they run out. °? Take other medicines only as told by your doctor or pharmacist. °· Stay active if told by your doctor. The elderly and people with severe heart failure should talk with a doctor about physical activity. °· Eat heart-healthy foods. Choose foods that are without trans fat and are low in saturated fat, cholesterol, and salt (sodium). This includes fresh or frozen fruits and vegetables, fish, lean meats, fat-free or low-fat dairy foods, whole grains, and high-fiber foods. Lentils and dried peas and beans (legumes) are also good choices. °· Limit salt if told by your doctor. °· Cook in a healthy way. Roast, grill, broil, bake, poach, steam, or stir-fry foods. °· Limit fluids as told by your doctor. °· Weigh yourself every morning. Do this after you pee (urinate) and before you eat breakfast. Write down your weight to give to your doctor. °· Take your blood pressure and write it down if your doctor tells you to. °· Ask your doctor how to check your pulse. Check your pulse as told. °· Lose weight if told by your doctor. °· Stop smoking or chewing tobacco. Do not use gum or patches that help you quit without your doctor's approval. °· Schedule and go to doctor visits as told. °· Nonpregnant women should have no more than 1 drink a day. Men should have no more than 2 drinks a day. Talk to your doctor about drinking alcohol. °· Stop illegal drug use. °· Stay current with shots (immunizations). °· Manage your health conditions as told by your  doctor. °· Learn to manage your stress. °· Rest when you are tired. °· If it is really hot outside: °? Avoid intense activities. °? Use air conditioning or fans, or get in a cooler place. °? Avoid caffeine and alcohol. °? Wear loose-fitting, lightweight, and light-colored clothing. °· If it is really cold outside: °? Avoid intense activities. °? Layer your clothing. °? Wear mittens or gloves, a hat, and a scarf when going outside. °? Avoid alcohol. °· Learn about heart failure and get support as needed. °· Get help to maintain or improve your quality of life and your ability to care for yourself as needed. °Contact a doctor if: °· You gain weight quickly. °· You are more short of breath than usual. °· You cannot do your normal activities. °· You tire easily. °· You cough more than normal, especially with activity. °· You have any or more puffiness (swelling) in areas such as your hands, feet, ankles, or belly (abdomen). °· You cannot sleep because it is hard to breathe. °· You feel like your heart is beating fast (palpitations). °· You get dizzy or light-headed when you stand up. °Get help right away if: °· You have trouble breathing. °· There is a change in mental status, such as becoming less alert or not being able to focus. °· You have chest pain or discomfort. °· You faint. °This information is not intended to replace advice given to you by your health care provider. Make sure you   discuss any questions you have with your health care provider. Document Released: 09/05/2008 Document Revised: 05/04/2016 Document Reviewed: 01/13/2013 Elsevier Interactive Patient Education  2017 [place]. Endoscopic Saphenous Vein Harvesting, Care After Refer to this sheet in the next few weeks. These instructions provide you with information about caring for yourself after your procedure. Your health care provider may also give you more specific instructions. Your treatment has been planned according to current medical  practices, but problems sometimes occur. Call your health care provider if you have any problems or questions after your procedure. What can I expect after the procedure? After the procedure, it is common to have:  Pain.  Bruising.  Swelling.  Numbness.  Follow these instructions at home: Medicine  Take over-the-counter and prescription medicines only as told by your health care provider.  Do not drive or operate heavy machinery while taking prescription pain medicine. Incision care   Follow instructions from your health care provider about how to take care of the cut made during surgery (incision). Make sure you: ? Wash your hands with soap and water before you change your bandage (dressing). If soap and water are not available, use hand sanitizer. ? Change your dressing as told by your health care provider. ? Leave stitches (sutures), skin glue, or adhesive strips in place. These skin closures may need to be in place for 2 weeks or longer. If adhesive strip edges start to loosen and curl up, you may trim the loose edges. Do not remove adhesive strips completely unless your health care provider tells you to do that.  Check your incision area every day for signs of infection. Check for: ? More redness, swelling, or pain. ? More fluid or blood. ? Warmth. ? Pus or a bad smell. General instructions  Raise (elevate) your legs above the level of your heart while you are sitting or lying down.  Do any exercises your health care providers have given you. These may include deep breathing, coughing, and walking exercises.  Do not shower, take baths, swim, or use a hot tub unless told by your health care provider.  Wear your elastic stocking if told by your health care provider.  Keep all follow-up visits as told by your health care provider. This is important. Contact a health care provider if:  Medicine does not help your pain.  Your pain gets worse.  You have new leg bruises  or your leg bruises get bigger.  You have a fever.  Your leg feels numb.  You have more redness, swelling, or pain around your incision.  You have more fluid or blood coming from your incision.  Your incision feels warm to the touch.  You have pus or a bad smell coming from your incision. Get help right away if:  Your pain is severe.  You develop pain, tenderness, warmth, redness, or swelling in any part of your leg.  You have chest pain.  You have trouble breathing. This information is not intended to replace advice given to you by your health care provider. Make sure you discuss any questions you have with your health care provider. Document Released: 08/09/2011 Document Revised: 05/04/2016 Document Reviewed: 10/11/2015 Elsevier Interactive Patient Education  2018 [place]. Coronary Artery Bypass Grafting, Care After These instructions give you information on caring for yourself after your procedure. Your doctor may also give you more specific instructions. Call your doctor if you have any problems or questions after your procedure. Follow these instructions at home:  Only  take medicine as told by your doctor. Take medicines exactly as told. Do not stop taking medicines or start any new medicines without talking to your doctor first.  Take your pulse as told by your doctor.  Do deep breathing as told by your doctor. Use your breathing device (incentive spirometer), if given, to practice deep breathing several times a day. Support your chest with a pillow or your arms when you take deep breaths or cough.  Keep the area clean, dry, and protected where the surgery cuts (incisions) were made. Remove bandages (dressings) only as told by your doctor. If strips were applied to surgical area, do not take them off. They fall off on their own.  Check the surgery area daily for puffiness (swelling), redness, or leaking fluid.  If surgery cuts were made in your legs: ? Avoid  crossing your legs. ? Avoid sitting for long periods of time. Change positions every 30 minutes. ? Raise your legs when you are sitting. Place them on pillows.  Wear stockings that help keep blood clots from forming in your legs (compression stockings).  Only take sponge baths until your doctor says it is okay to take showers. Pat the surgery area dry. Do not rub the surgery area with a washcloth or towel. Do not bathe, swim, or use a hot tub until your doctor says it is okay.  Eat foods that are high in fiber. These include raw fruits and vegetables, whole grains, beans, and nuts. Choose lean meats. Avoid canned, processed, and fried foods.  Drink enough fluids to keep your pee (urine) clear or pale yellow.  Weigh yourself every day.  Rest and limit activity as told by your doctor. You may be told to: ? Stop any activity if you have chest pain, shortness of breath, changes in heartbeat, or dizziness. Get help right away if this happens. ? Move around often for short amounts of time or take short walks as told by your doctor. Gradually become more active. You may need help to strengthen your muscles and build endurance. ? Avoid lifting, pushing, or pulling anything heavier than 10 pounds (4.5 kg) for at least 6 weeks after surgery.  Do not drive until your doctor says it is okay.  Ask your doctor when you can go back to work.  Ask your doctor when you can begin sexual activity again.  Follow up with your doctor as told. Contact a doctor if:  You have puffiness, redness, more pain, or fluid draining from the incision site.  You have a fever.  You have puffiness in your ankles or legs.  You have pain in your legs.  You gain 2 or more pounds (0.9 kg) a day.  You feel sick to your stomach (nauseous) or throw up (vomit).  You have watery poop (diarrhea). Get help right away if:  You have chest pain that goes to your jaw or arms.  You have shortness of breath.  You have a  fast or irregular heartbeat.  You notice a "clicking" in your breastbone when you move.  You have numbness or weakness in your arms or legs.  You feel dizzy or light-headed. This information is not intended to replace advice given to you by your health care provider. Make sure you discuss any questions you have with your health care provider. Document Released: 12/02/2013 Document Revised: 05/04/2016 Document Reviewed: 05/06/2013 Elsevier Interactive Patient Education  2017 [employer].

## 2018-02-22 NOTE — Progress Notes (Signed)
  Speech Language Pathology Patient Details Name: Brittany [name] MRN: [id number] DOB: 12/25/1960 Today's Date: 02/22/2018 Time: 0940-1000    For complete results of MBS, see progress note. This SLP agrees with Dr. Ricard Dillon suggestion of recurrent laryngeal nerve involvement for this pt s/p radiation for lung cancer in addition to chronic hoarse vocal quality and no significant difference in MBS results despite pt becoming stronger with improving respiratory status. Recommended upgrade to regular/thin liquids.     Orbie Pyo [place] [phone]

## 2018-02-22 NOTE — Discharge Summary (Addendum)
Physician Discharge Summary  Patient ID: Nattaly Yebra MRN: [id number] DOB/AGE: 1960/12/30 57 y.o.  Admit date: 02/03/2018 Discharge date: 02/25/2018  Admission Diagnoses: Acute STEMI  Discharge Diagnoses:  Principal Problem:   Cardiogenic shock ([place]) Active Problems:   HTN (hypertension)   Atrial fibrillation (HCC)   Chest pain   Hypertension   STEMI involving left anterior descending coronary artery ([place])   Coronary artery disease involving native coronary artery of native heart with unstable angina pectoris ([place])   Acute systolic congestive heart failure (HCC)   S/P CABG (coronary artery bypass graft)   Acute respiratory failure with hypoxia [facility])  Patient Active Problem List   Diagnosis Date Noted  . Acute respiratory failure with hypoxia ([place])   . STEMI involving left anterior descending coronary artery ([place]) 02/03/2018  . Cardiogenic shock ([place]) 02/03/2018  . Coronary artery disease involving native coronary artery of native heart with unstable angina pectoris ([place]) 02/03/2018  . Acute systolic congestive heart failure ([place]) 02/03/2018  . S/P CABG (coronary artery bypass graft) 02/03/2018  . Prophylactic measure   . Hypertension   . Asthma   . Anxiety   . Allergy   . CAP (community acquired pneumonia) 03/15/2015  . Chest pain 03/15/2015  . Pneumonia 03/15/2015  . Lung cancer ([place])   . Small cell lung cancer ([place]) 03/16/2014  . Protein-calorie malnutrition, severe ([place]) 03/14/2014  . Atrial fibrillation ([place]) 03/12/2014  . SVT (supraventricular tachycardia) ([place]) 03/11/2014  . Lung mass 03/10/2014  . HTN (hypertension) 05/07/2012  . 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 early today with several day history of worsening chest tightness and shortness of breath.  In the emergency department she was noted to be tachycardic with new left bundle branch block on EKG.  Troponin level was severely elevated and greater than 30.  Code STEMI was called and the patient was taken directly to the cardiac Cath Lab at [facility] by Dr. Martinique where she was found to have severe coronary artery disease with likely acute occlusion of the ostial left anterior descending coronary artery.  With attempts to pass a wire for percutaneous coronary intervention the patient suddenly developed acute occlusion of the left circumflex coronary artery which was complicated by the development of acute cardiogenic shock with hypotension.  Balloon angioplasty of left circumflex coronary artery was accomplished successfully to reestablish flow in the left circumflex distribution.  Emergency cardiothoracic surgical consultation was requested.  The patient was emergently intubated by the anesthesia team because of acute hypoxemic respiratory failure.  An intra-aortic balloon pump was placed.  Upon my arrival the patient was intubated and sedated on the Cath Lab table with intra-aortic balloon pump in place.  She remained hypotensive and tachycardic with severely elevated left ventricular end-diastolic pressure.  The patient was taken to the OR for Emergency CABG   Discharged Condition: good  Hospital Course:  The patient was admitted through the ER and Code STEMI was activated. She was taken to Cath lab and required emergent surgical intervention as described below. She was then taken to the SICU in critical condition.   Post op hospital course:  The patient has overall done quite well. She  initially required aggressive impella management and Inotropic support as well as full ventillator  support. She also required blood and product transfusion. She was sedated early on as well. The AHF team was brought in to assist with management of heart failure issues which included slow weaning of suport devices and inotropic gtts over several days. She required a significant diuresis. She is noted to have significant AI which is contributing to the heart failure.   The patients neurological status has been grossly intact but she has had some swallow issues which have been evaluated by speech therapy and they have been adjusting diet. She is felt to have a potential RLN injury based on MBS but is tol thin liquids.  CCM was also consulted to assist with vent management and other critical issues . The patient had the Impella removed in the OR on 02/08/18 by Dr Roxy Manns. The patient was also extubated on 3/1 /19. Early afterwards she needed BIPAP but O2 is slowly down titrated. She has also been treated for HCAP for a full ABX coarse , specifically Vanco and Fortaz. Leukocytosis is slowly improving over time, She did also get a coarse of steroids. She has had post op atrial fibrillation and is on DAPT therapy as not a candidate for ACRX d/t h/o lung cancer (small cell). The patients overall progress has been significant but it is felt she will require further rehab in a SNF setting for a couple weeks. At time of discharge she is felt to be stable.   Consults: cardiology  Significant Diagnostic Studies: angiography: cardiac cath Conclusion     Prox RCA to Mid RCA lesion is 25% stenosed.  Ost Ramus lesion is 70% stenosed.  Ost Cx to Prox Cx lesion is 50% stenosed.  Ost LAD to Prox LAD lesion is 100% stenosed.  LV end diastolic pressure is severely elevated.  Hemodynamic findings consistent with severe pulmonary hypertension.  Post intervention, there is a 100% residual stenosis.   1. 2 vessel obstructive CAD. There was ostial LAD occlusion. With coronary guidewire probing there was shift of plaque  into the LCx and ramus intermediate with acute vessel compromise and resultant worsening cardiogenic shock. The patency of LCx and ramus intermediate vessels were maintained with PTCA and wire placement. 2. Cardiogenic shock with marked elevation of LV filling pressures and right heart pressures with pulmonary venous HTN. Please refer to note below. 3. S/p IABP placement.   Plan: transfer to OR as noted for emergent CABG. Continue pressor support and IABP. Continue ventilator support.     Treatments: surgery:  CARDIOTHORACIC SURGERY OPERATIVE NOTE  Date of Procedure:    02/03/2018  Preoperative Diagnosis:        Severe Multi-vessel Coronary Artery Disease  Acute ST-segment Elevation Myocardial Infarction  Cardiogenic Shock  Postoperative Diagnosis:    Same  Procedure:        Emergency Coronary Artery Bypass Grafting x 3              Left Internal Mammary Artery to Distal Left Anterior Descending Coronary Artery             Saphenous Vein Graft to Obtuse Marginal Branch of Left Circumflex Coronary Artery             Sapheonous Vein Graft to Diagonal Branch Coronary Artery             Endoscopic Vein Harvest from Right Thigh   Implantation of Impella LD Left Ventricular Assist Device  10 mm Hemashield graft with direct aortic approach  Surgeon:        Valentina Gu. Roxy Manns, MD  Assistant:       Ellwood Handler, PA-C  Anesthesia:    Adele Barthel, MD  Operative Findings: ? Pre-operative cardiogenic shock ? Profound left ventricular systolic dysfunction ? Moderate aortic insufficiency ? Good quality left internal mammary artery conduit ? Good quality saphenous vein conduit ? Good quality target vessels for grafting    Discharge Exam: Blood pressure 107/68, pulse 85, temperature 98.3 F (36.8 C), temperature source Oral, resp. rate 15, height 5\' 1"  (1.549 m), weight 103 lb 4.8 oz (46.9 kg), SpO2 98 %.  General appearance: alert, cooperative and no  distress Heart: regular rate and rhythm Lungs: coarse upper airway ronchi, fair air movement, improves with cough Abdomen: benign Extremities: no edema Wound: incis healing well   Disposition: Discharge disposition: 03-Skilled Nursing Facility       Discharge Instructions    Amb Referral to Cardiac Rehabilitation   Complete by:  As directed    Diagnosis:   CABG PTCA STEMI     CABG X ___:  3   Discharge patient   Complete by:  As directed    Discharge disposition:  03-Skilled Nursing Facility   Discharge patient date:  02/25/2018     Allergies as of 02/25/2018      Reactions   Iodinated Diagnostic Agents Hives, Rash   Latex Hives, Other (See Comments)   Skin irritation   Pineapple Hives   Sulfa Antibiotics Hives, Rash   Codeine Nausea And Vomiting      Medication List    STOP taking these medications   lisinopril 10 MG tablet Commonly known as:  PRINIVIL,ZESTRIL     TAKE these medications   amiodarone 200 MG tablet Commonly known as:  PACERONE Take 1 tablet (200 mg total) by mouth daily.   aspirin 81 MG EC tablet Take 1 tablet (81 mg total) by mouth daily.   atorvastatin 80 MG tablet Commonly known as:  LIPITOR Take 1 tablet (80 mg total) by mouth daily at 6 PM.   clopidogrel 75 MG tablet Commonly known as:  PLAVIX Take 1 tablet (75 mg total) by mouth daily.   Digoxin 62.5 MCG Tabs Take 0.0625 mg by mouth daily.   furosemide 40 MG tablet Commonly known as:  LASIX Take 1 tablet (40 mg total) by mouth daily.   ipratropium-albuterol 0.5-2.5 (3) MG/3ML Soln Commonly known as:  DUONEB Take 3 mLs by nebulization 2 (two) times daily.   ivabradine 5 MG Tabs tablet Commonly known as:  CORLANOR Take 1 tablet (5 mg total) by mouth 2 (two) times daily with a meal.   losartan 25 MG tablet Commonly known as:  COZAAR Take 1 tablet (25 mg total) by mouth daily.   oxyCODONE 5 MG immediate release tablet Commonly known as:  Oxy IR/ROXICODONE Take 1-2  tablets (5-10 mg total) by mouth every 6 (six) hours as needed for severe pain.      Follow-up Information    Rexene Alberts, MD Follow up.   Specialty:  Cardiothoracic Surgery Why:  Appt to see Dr Roxy Manns on 03/25/18 at 1 pm. Please obtain a Chest Xray at [facility] at 12:30 , which is located in the same office complex on the first floor Contact information: [place] [address] [phone]        Martinique, Peter M, MD Follow up.   Specialty:  Cardiology Why:  please see discharge paperwork for appointment to see cardiology Contact information: [address] [place] [address] [phone]        [place]. Go on 03/05/2018.   Specialty:  Cardiology Why:  12:00  [facility], parking code [place] information: [address] [id number] Danice Goltz [place] [phone]         The patient has been discharged on:   1.Beta Blocker:  Yes [   ]                              No   [  n ]                              If No, reason:heart failure meds per AHF team   2.Ace Inhibitor/ARB: Yes [ y  ]                                     No  [    ]                                     If No, reason:  3.Statin:   Yes Blue.[place]ese   ]                  No  [   ]                  If No, reason:  4.[place]:  Yes  [ y  ]                  No   [   ]                  If No, reason: 1. Please obtain vital signs at least one time daily 2.Please weigh the patient daily. If he or she continues to gain weight or develops lower extremity edema, contact the office at [phone]. 3. Ambulate patient at least three times daily and please use sternal precautions. 4 cont O2 at 2 liters, wean as able Signed: John Giovanni 02/25/2018, 9:01 AM

## 2018-02-22 NOTE — Progress Notes (Signed)
PT Cancellation Note  Patient Details Name: Brittany [name] MRN: [id number] DOB: 1961/04/01   Cancelled Treatment:    Reason Eval/Treat Not Completed: Other (comment);Patient at procedure or test/unavailable. Attempted several times to see pt. Earlier pt was down for MBS then pt waiting for late lunch and then eating lunch. Pt frustrated that lunch was not regular diet as she was upgraded this AM. Pt seems a little overwhelmed with everything going on. Encouraged pt to amb with nursing later this evening when she has had a chance to get her bearings and things have settled down.   [place] 02/22/2018, 4:41 PM [employer] PT [phone]

## 2018-02-22 NOTE — Progress Notes (Signed)
Modified Barium Swallow Progress Note  Patient Details  Name: Brittany [name] MRN: [id number] Date of Birth: 1961-06-16  Today's Date: 02/22/2018  Modified Barium Swallow completed.  Full report located under Chart Review in the Imaging Section.  Brief recommendations include the following:  Clinical Impression  Pharyngeal phase swallow findings mimicked initial MBS 3/7 resulting in larygneal penetration and transient aspiration with thin barium (barium ascended onto vocal cords during swallow). During today's study, timing of laryngeal elevation and initiation of epiglottis to close vestibule within functional limits however closure and inversion was weak and incomplete. Purposeful breath hold, chin tuck and supraglottic swallow maneuvers were insignificant to alter laryngeal protection. Pt does sense penetration albeit decreased increasing aspiration risk when she is medically compromised. Aspiration occurred when pt used straw due to increased volume and velocity of liquid. 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 Educated pt on precautions to mitigate aspiration risk and reiterated importance of producing a volitional hard throat clear after every other sip, no straws, small sips and meds in applesauce (NOT crushed). Upgraded pt's texture to regular and thin liquids. Pt may benefit from outpatient ST to focus on intervention of exercises for pt's post radiation.     Swallow Evaluation Recommendations       SLP Diet Recommendations: Regular solids;Thin liquid   Liquid Administration via: Cup;No straw   Medication Administration: Whole meds with puree   Supervision: Patient able to self feed;Intermittent supervision to cue for compensatory strategies   Compensations: Slow rate;Small sips/bites;Clear throat  intermittently   Postural Changes: Seated upright at 90 degrees   Oral Care Recommendations: Oral care BID        Brittany [name] 02/22/2018,12:06 PM   [name].Ed [employer] [phone]

## 2018-02-23 LAB — BASIC METABOLIC PANEL
ANION GAP: 9 (ref 5–15)
BUN: 23 mg/dL — ABNORMAL HIGH (ref 6–20)
CO2: 27 mmol/L (ref 22–32)
Calcium: 8.9 mg/dL (ref 8.9–10.3)
Chloride: 99 mmol/L — ABNORMAL LOW (ref 101–111)
Creatinine, Ser: 0.87 mg/dL (ref 0.44–1.00)
GLUCOSE: 173 mg/dL — AB (ref 65–99)
Potassium: 4.7 mmol/L (ref 3.5–5.1)
SODIUM: 135 mmol/L (ref 135–145)

## 2018-02-23 NOTE — Plan of Care (Signed)
Reviewed and patient is progressing

## 2018-02-23 NOTE — Progress Notes (Signed)
Patient vomited large amount of brownish emesis with undigested food, approximately 500 cc.  Zofran 4 mg iv given.

## 2018-02-23 NOTE — Progress Notes (Signed)
Patient states she will have RN contact RT when she is ready for BIPAP.

## 2018-02-23 NOTE — Progress Notes (Addendum)
      [address]       [place] [address]             [phone]        15 Days Post-Op Procedure(s) (LRB): REMOVAL OF IMPELLA LEFT VENTRICULAR ASSIST DEVICE (N/A) TRANSESOPHAGEAL ECHOCARDIOGRAM (TEE) (N/A)  Subjective: She states diet change has been going ok, but had an episode of nausea and vomiting this am.  Objective: Vital signs in last 24 hours: Temp:  [97.8 F (36.6 C)-98.2 F (36.8 C)] 97.8 F (36.6 C) (03/16 0412) Pulse Rate:  [72-92] 72 (03/16 0412) Cardiac Rhythm: Normal sinus rhythm (03/15 1900) Resp:  [11-20] 11 (03/16 0412) BP: (96-124)/(54-96) 96/54 (03/16 0412) SpO2:  [98 %-100 %] 98 % (03/16 0412) Weight:  [117 lb 1 oz (53.1 kg)] 117 lb 1 oz (53.1 kg) (03/16 0412)  Pre op weight  53 kg Current Weight  02/23/18 117 lb 1 oz (53.1 kg)      Intake/Output from previous day: 03/15 0701 - 03/16 0700 In: 400 [P.O.:337; I.V.:63] Out: 850 [Urine:850]   Physical Exam:  Cardiovascular: RRR Pulmonary: Clear to auscultation bilaterally Abdomen: Soft, non tender, bowel sounds present. Extremities: No lower extremity edema. Wounds: Clean and dry.  No erythema or signs of infection.  Lab Results: CBC: Recent Labs    02/21/18 1120  WBC 13.0*  HGB 10.0*  HCT 31.2*  PLT 413*   BMET:  Recent Labs    02/22/18 0530 02/23/18 0420  NA 135 135  K 4.9 4.7  CL 98* 99*  CO2 28 27  GLUCOSE 162* 173*  BUN 19 23*  CREATININE 0.81 0.87  CALCIUM 9.1 8.9    PT/INR:  Lab Results  Component Value Date   INR 1.28 02/08/2018   INR 1.70 02/04/2018   INR 2.34 02/03/2018   ABG:  INR: Will add last result for INR, ABG once components are confirmed Will add last 4 CBG results once components are confirmed  Assessment/Plan:  1. CV - Prior a fib. SR in the 70's. On Amiodarone 200 mg daily, Spironolactone 25 mg daily, Plavix 75 mg daily, Losartan 25 mg daily. 2.  Pulmonary - On 2 liters of oxygen via [place]. Encourage incentive  spirometer. 3. Acute on chronic systolic heart failure- On Lasix 40 mg daily 4.  Acute blood loss anemia - Last H and H 10 and 31.2 5. Pre op HGA1C 5.6. She is likely pre diabetic. Will need follow up with medical doctor after discharge. 6. GI-Dysphagia. Evaluated by speech path yesterday. Diet advanced to regular/thin liquids. Per patient request, will not make NPO but if she has any further nausea or vomiting, will make her NPO and obtain KUB. 7. Will need SNF when ready for discharge  Donielle M ZimmermanPA-C 02/23/2018,8:14 AM  Abdomen is flat, soft , bowel sounds present no further vomiting  Wait on SNF transfer today  I have seen and examined [name] and agree with the above assessment  and plan.  Grace Isaac MD Beeper [phone] Office [phone] 02/23/2018 11:53 AM

## 2018-02-23 NOTE — Clinical Social Work Note (Signed)
Clinical Social Worker continuing to follow patient for support and discharge planning needs.  Per RN, patient with an excessive vomiting episode today after advancing diet, therefore not medically clear for discharge.  CSW will follow up with patient regarding available bed once medically stable.  Barbette Or, [place]

## 2018-02-23 NOTE — Plan of Care (Signed)
  Progressing Education: Knowledge of General Education information will improve 02/23/2018 2334 - Progressing by Drenda Freeze, RN Health Behavior/Discharge Planning: Ability to manage health-related needs will improve 02/23/2018 2334 - Progressing by Drenda Freeze, RN Clinical Measurements: Ability to maintain clinical measurements within normal limits will improve 02/23/2018 2334 - Progressing by Drenda Freeze, RN Will remain free from infection 02/23/2018 2334 - Progressing by Drenda Freeze, RN

## 2018-02-24 LAB — BASIC METABOLIC PANEL
Anion gap: 7 (ref 5–15)
BUN: 27 mg/dL — AB (ref 6–20)
CO2: 29 mmol/L (ref 22–32)
Calcium: 9.4 mg/dL (ref 8.9–10.3)
Chloride: 97 mmol/L — ABNORMAL LOW (ref 101–111)
Creatinine, Ser: 0.91 mg/dL (ref 0.44–1.00)
GFR calc Af Amer: >60 mL/min (ref >60)
GLUCOSE: 143 mg/dL — AB (ref 65–99)
POTASSIUM: 5.3 mmol/L — AB (ref 3.5–5.1)
Sodium: 133 mmol/L — ABNORMAL LOW (ref 135–145)

## 2018-02-24 NOTE — Plan of Care (Signed)
Reviewed and patient is progressing.

## 2018-02-24 NOTE — Progress Notes (Addendum)
      [address]       [place] [address]             [phone]        16 Days Post-Op Procedure(s) (LRB): REMOVAL OF IMPELLA LEFT VENTRICULAR ASSIST DEVICE (N/A) TRANSESOPHAGEAL ECHOCARDIOGRAM (TEE) (N/A)  Subjective: She is eating eggs and french toast this am. She has not had any further emesis.  Objective: Vital signs in last 24 hours: Temp:  [97.6 F (36.4 C)-98.6 F (37 C)] 98 F (36.7 C) (03/17 0534) Pulse Rate:  [61-80] 73 (03/17 0534) Cardiac Rhythm: Normal sinus rhythm (03/17 0700) Resp:  [11-16] 12 (03/17 0534) BP: (97-114)/(58-66) 97/58 (03/17 0534) SpO2:  [98 %-100 %] 100 % (03/17 0534) Weight:  [102 lb 11.2 oz (46.6 kg)] 102 lb 11.2 oz (46.6 kg) (03/17 0534)  Pre op weight  53 kg Current Weight  02/24/18 102 lb 11.2 oz (46.6 kg)      Intake/Output from previous day: 03/16 0701 - 03/17 0700 In: 1074 [P.O.:1074] Out: 1400 [Urine:900; Emesis/NG output:500]   Physical Exam:  Cardiovascular: RRR Pulmonary: Coarse bilaterally Abdomen: Soft, non tender, bowel sounds present. Extremities: No lower extremity edema. Wounds: Clean and dry.  No erythema or signs of infection.  Lab Results: CBC: Recent Labs    02/21/18 1120  WBC 13.0*  HGB 10.0*  HCT 31.2*  PLT 413*   BMET:  Recent Labs    02/23/18 0420 02/24/18 0500  NA 135 133*  K 4.7 5.3*  CL 99* 97*  CO2 27 29  GLUCOSE 173* 143*  BUN 23* 27*  CREATININE 0.87 0.91  CALCIUM 8.9 9.4    PT/INR:  Lab Results  Component Value Date   INR 1.28 02/08/2018   INR 1.70 02/04/2018   INR 2.34 02/03/2018   ABG:  INR: Will add last result for INR, ABG once components are confirmed Will add last 4 CBG results once components are confirmed  Assessment/Plan:  1. CV - Prior a fib. SR in the 70's. On Amiodarone 200 mg daily, Spironolactone 25 mg daily, Plavix 75 mg daily, Losartan 25 mg daily. 2.  Pulmonary - On 2 liters of oxygen via [place]. Will likely need oxygen at  discharge. Encourage incentive spirometer. 3. Acute on chronic systolic heart failure- On Lasix 40 mg daily 4.  Acute blood loss anemia - Last H and H 10 and 31.2 5. Pre op HGA1C 5.6. She is likely pre diabetic. Will need follow up with medical doctor after discharge. 6. GI-Dysphagia. No further emesis but poor po intake. Is taking Ensure. 7. Will need SNF when ready for discharge-possibly in am  Donielle M ZimmermanPA-C 02/24/2018,7:57 AM  Nausea and vomiting yesterday has resolved  snf placement when ready for d/c I have seen and examined Brittany [name] and agree with the above assessment  and plan.  Grace Isaac MD Beeper [phone] Office [phone] 02/24/2018 10:20 AM

## 2018-02-24 NOTE — Plan of Care (Signed)
  Progressing Education: Knowledge of General Education information will improve 02/24/2018 2320 - Progressing by Drenda Freeze, RN Health Behavior/Discharge Planning: Ability to manage health-related needs will improve 02/24/2018 2320 - Progressing by Drenda Freeze, RN Clinical Measurements: Ability to maintain clinical measurements within normal limits will improve 02/24/2018 2320 - Progressing by Drenda Freeze, RN

## 2018-02-25 LAB — BASIC METABOLIC PANEL
Anion gap: 9 (ref 5–15)
BUN: 27 mg/dL — AB (ref 6–20)
CALCIUM: 9.4 mg/dL (ref 8.9–10.3)
CO2: 28 mmol/L (ref 22–32)
CREATININE: 0.96 mg/dL (ref 0.44–1.00)
Chloride: 93 mmol/L — ABNORMAL LOW (ref 101–111)
GFR calc non Af Amer: >60 mL/min (ref >60)
Glucose, Bld: 163 mg/dL — ABNORMAL HIGH (ref 65–99)
Potassium: 5.5 mmol/L — ABNORMAL HIGH (ref 3.5–5.1)
SODIUM: 130 mmol/L — AB (ref 135–145)

## 2018-02-25 MED ORDER — LOSARTAN POTASSIUM 25 MG PO TABS: 25.0000 mg | ORAL_TABLET | Freq: Every day | ORAL | Status: DC

## 2018-02-25 MED ORDER — FUROSEMIDE 40 MG PO TABS: 40.0000 mg | ORAL_TABLET | Freq: Every day | ORAL | Status: DC

## 2018-02-25 MED ORDER — OXYCODONE HCL 5 MG PO TABS: 5.0000 mg | ORAL_TABLET | Freq: Four times a day (QID) | ORAL | 0 refills | Status: DC | PRN

## 2018-02-25 MED ORDER — AMIODARONE HCL 200 MG PO TABS: 200.0000 mg | ORAL_TABLET | Freq: Every day | ORAL | Status: DC

## 2018-02-25 MED ORDER — DIGOXIN 62.5 MCG PO TABS: 0.0625 mg | ORAL_TABLET | Freq: Every day | ORAL | Status: DC

## 2018-02-25 MED ORDER — IPRATROPIUM-ALBUTEROL 0.5-2.5 (3) MG/3ML IN SOLN: 3.0000 mL | Freq: Two times a day (BID) | RESPIRATORY_TRACT | Status: DC

## 2018-02-25 MED ORDER — CLOPIDOGREL BISULFATE 75 MG PO TABS: 75.0000 mg | ORAL_TABLET | Freq: Every day | ORAL | Status: DC

## 2018-02-25 MED ORDER — ASPIRIN 81 MG PO TBEC: 81.0000 mg | DELAYED_RELEASE_TABLET | Freq: Every day | ORAL | Status: DC

## 2018-02-25 MED ORDER — ATORVASTATIN CALCIUM 80 MG PO TABS: 80.0000 mg | ORAL_TABLET | Freq: Every day | ORAL | Status: DC

## 2018-02-25 MED ORDER — IVABRADINE HCL 5 MG PO TABS: 5.0000 mg | ORAL_TABLET | Freq: Two times a day (BID) | ORAL | Status: DC

## 2018-02-25 NOTE — Progress Notes (Signed)
  Speech Language Pathology Treatment: Dysphagia  Patient Details Name: Brittany [name] MRN: [id number] DOB: 01-Sep-1961 Today's Date: 02/25/2018 Time: 4734-0370 SLP Time Calculation (min) (ACUTE ONLY): 8 min  Assessment / Plan / Recommendation Clinical Impression  Pt easily woken from sleep and states she has been receiving medicine for nausea and feels better. Appetite still low but slowly improving since vomiting episodes. No cough, throat clear or significant wet vocal quality (from baseline). Respiratory status appears stable; afebrile. Will see once more. Plans are for SNF level rehab. Will discuss option for outpatient ST in the future for dysphagia if pt interested.     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 She was admitted with NSTEMI and shock, s/p emergent CABG on 02/03/18. Intubated 02/03/18 to 02/08/18.       SLP Plan  Continue with current plan of care       Recommendations  Diet recommendations: Dysphagia 3 (mechanical soft);Thin liquid Liquids provided via: Cup;No straw Medication Administration: Whole meds with puree Supervision: Patient able to self feed Compensations: Slow rate;Small sips/bites;Clear throat intermittently Postural Changes and/or Swallow Maneuvers: Seated upright 90 degrees                Oral Care Recommendations: Oral care BID Follow up Recommendations: Outpatient SLP SLP Visit Diagnosis: Dysphagia, pharyngeal phase (R13.13) Plan: Continue with current plan of care                       [name] 02/25/2018, 10:47 AM  [name].[employer] [phone]

## 2018-02-25 NOTE — Progress Notes (Signed)
Patient refuse to work in hall. "I have heartburn."

## 2018-02-25 NOTE — Progress Notes (Signed)
Physical Therapy Treatment Patient Details Name: Brittany [name] MRN: [id number] DOB: Mar 19, 1961 Today's Date: 02/25/2018    History of Present Illness Pt is a 57 y.o. female s/p emergent CABGx3 on 02/03/18. IABP removed 2/25. Impella removed 3/1. Intubated 2/24-3/1. PMH includes small cell lung cancer (s/p treatment 2015), HTN, PAF, CHF, asthma, anxiety.    PT Comments    Pt very agitated on arrival to room and states "lets get this over with". Pt unsafe with mobility secondary to agitation. She rushed through ambulation without taking time to self monitor activity tolerance. Cues given to slow down and for pursed lipped breathing. Pt pale and dizzy with ambulation, however refused rollator and refused seated rest break. Instead took short standing breaks to recover SpO2 sats. Pt would benefit from continued skilled PT to increase activity tolerance and safety with mobility. Will continue to follow acutely.    Follow Up Recommendations  SNF     Equipment Recommendations  (TBD at next venue)    Recommendations for Other Services OT consult     Precautions / Restrictions Precautions Precautions: Fall;Sternal Precaution Booklet Issued: Yes (comment) Precaution Comments: Verbally reviewed Restrictions Weight Bearing Restrictions: Yes(sternal precautions) Other Position/Activity Restrictions: Sternal    Mobility  Bed Mobility Overal bed mobility: Needs Assistance Bed Mobility: Rolling;Sidelying to Sit Rolling: Supervision Sidelying to sit: Supervision       General bed mobility comments: no assist required for bed mobilities. Supervision for safety and cues for sternal precautions.   Transfers Overall transfer level: Needs assistance Equipment used: Rolling walker (2 wheeled)(pt refused 4 wheeled walker with seat) Transfers: Sit to/from Stand Sit to Stand: Min guard         General transfer comment: min g for safety and cues for technique to maintain sternal  precautions.  Ambulation/Gait Ambulation/Gait assistance: Min assist Ambulation Distance (Feet): 200 Feet Assistive device: Rolling walker (2 wheeled) Gait Pattern/deviations: Step-through pattern;Decreased step length - right;Decreased step length - left Gait velocity: decr Gait velocity interpretation: Below normal speed for age/gender General Gait Details: Assist for RW management. Cues for pursed lipped breathing and decrease speed. Becomes dizzy and pale during ambulation, however refused to sit, only willing to take short standing breaks.   Stairs            Wheelchair Mobility    Modified Rankin (Stroke Patients Only)       Balance Overall balance assessment: Needs assistance Sitting-balance support: Feet supported;No upper extremity supported Sitting balance-Leahy Scale: Fair     Standing balance support: Bilateral upper extremity supported Standing balance-Leahy Scale: Poor Standing balance comment: Required UE support                            Cognition Arousal/Alertness: Awake/alert Behavior During Therapy: Agitated Overall Cognitive Status: Within Functional Limits for tasks assessed                                 General Comments: PT very agitated on arrival and making unsafe decisions secondary to aggitation and desire to "get this over with". Pt refusing use of rollator with chair instead insisting on using RW. Rushind down the hallway at unsafe speeds and unable to self monitor activity tolerance.       Exercises      General Comments        Pertinent Vitals/Pain Pain Assessment: Faces Faces Pain Scale: Hurts little more  Pain Location: chest incision, stomach, headache Pain Descriptors / Indicators: Operative site guarding Pain Intervention(s): Monitored during session    Home Living                      Prior Function            PT Goals (current goals can now be found in the care plan section)  Acute Rehab PT Goals Patient Stated Goal: Be able to do something for myself PT Goal Formulation: With patient Time For Goal Achievement: 03/05/18 Potential to Achieve Goals: Good Progress towards PT goals: Progressing toward goals    Frequency    Min 3X/week      PT Plan Current plan remains appropriate    Co-evaluation              AM-PAC PT "6 Clicks" Daily Activity  Outcome Measure  Difficulty turning over in bed (including adjusting bedclothes, sheets and blankets)?: A Little Difficulty moving from lying on back to sitting on the side of the bed? : A Little Difficulty sitting down on and standing up from a chair with arms (e.g., wheelchair, bedside commode, etc,.)?: Unable Help needed moving to and from a bed to chair (including a wheelchair)?: A Little Help needed walking in hospital room?: A Little Help needed climbing 3-5 steps with a railing? : Total 6 Click Score: 14    End of Session Equipment Utilized During Treatment: Gait belt;Oxygen Activity Tolerance: Patient limited by fatigue;Treatment limited secondary to agitation Patient left: with call bell/phone within reach;in chair;with nursing/sitter in room Nurse Communication: Mobility status PT Visit Diagnosis: Other abnormalities of gait and mobility (R26.89);Muscle weakness (generalized) (M62.81)     Time: 1657-9038 PT Time Calculation (min) (ACUTE ONLY): 28 min  Charges:  $Gait Training: 23-37 mins                    G Codes:       Benjiman Core, [place] Pager [phone] Acute Rehab   Allena Katz 02/25/2018, 11:30 AM

## 2018-02-25 NOTE — Progress Notes (Signed)
Patient ambulated 478ft in the hall using walker on 4L of oxygen, ambulation fairly tolerated will continue to monitor.

## 2018-02-25 NOTE — Progress Notes (Addendum)
[address]       [place] [address]             [phone]      17 Days Post-Op Procedure(s) (LRB): REMOVAL OF IMPELLA LEFT VENTRICULAR ASSIST DEVICE (N/A) TRANSESOPHAGEAL ECHOCARDIOGRAM (TEE) (N/A) Subjective: Feeling better, tolerating liquids without difficulty  Objective: Vital signs in last 24 hours: Temp:  [98.1 F (36.7 C)-98.5 F (36.9 C)] 98.3 F (36.8 C) (03/18 0529) Pulse Rate:  [57-80] 80 (03/18 0529) Cardiac Rhythm: Normal sinus rhythm (03/17 1900) Resp:  [14-17] 15 (03/18 0529) BP: (99-108)/(60-67) 108/67 (03/18 0529) SpO2:  [92 %-100 %] 99 % (03/18 0529) Weight:  [103 lb 4.8 oz (46.9 kg)] 103 lb 4.8 oz (46.9 kg) (03/18 0529)  Hemodynamic parameters for last 24 hours:    Intake/Output from previous day: 03/17 0701 - 03/18 0700 In: 1187 [P.O.:957; I.V.:230] Out: 1050 [Urine:1050] Intake/Output this shift: No intake/output data recorded.  General appearance: alert, cooperative and no distress Heart: regular rate and rhythm Lungs: coarse upper airway ronchi, fair air movement, improves with cough Abdomen: benign Extremities: no edema Wound: incis healing well  Lab Results: No results for input(s): WBC, HGB, HCT, PLT in the last 72 hours. BMET:  Recent Labs    02/24/18 0500 02/25/18 0604  NA 133* 130*  K 5.3* 5.5*  CL 97* 93*  CO2 29 28  GLUCOSE 143* 163*  BUN 27* 27*  CREATININE 0.91 0.96  CALCIUM 9.4 9.4    PT/INR: No results for input(s): LABPROT, INR in the last 72 hours. ABG    Component Value Date/Time   PHART 7.433 02/09/2018 0415   HCO3 28.0 02/09/2018 0415   TCO2 40 (H) 02/16/2018 1435   ACIDBASEDEF 1.0 02/08/2018 2117   O2SAT 66.0 02/19/2018 0345   CBG (last 3)  No results for input(s): GLUCAP in the last 72 hours.  Meds Scheduled Meds: . amiodarone  200 mg Oral Daily  . aspirin EC  81 mg Oral Daily  . atorvastatin  80 mg Oral q1800  . Chlorhexidine Gluconate Cloth  6 each Topical Daily  .  clopidogrel  75 mg Oral Daily  . digoxin  0.0625 mg Oral Daily  . enoxaparin (LOVENOX) injection  40 mg Subcutaneous Q24H  . feeding supplement (ENSURE ENLIVE)  237 mL Oral TID BM  . furosemide  40 mg Oral Daily  . ipratropium-albuterol  3 mL Nebulization BID  . ivabradine  5 mg Oral BID WC  . losartan  25 mg Oral Daily  . mouth rinse  15 mL Mouth Rinse BID  . pantoprazole  40 mg Oral Daily  . sodium chloride flush  10-40 mL Intracatheter Q12H  . spironolactone  25 mg Oral Daily   Continuous Infusions: . sodium chloride Stopped (02/18/18 1540)   PRN Meds:.ALPRAZolam, bisacodyl **OR** bisacodyl, docusate sodium, levalbuterol, metoprolol tartrate, morphine injection, ondansetron (ZOFRAN) IV, oxyCODONE, RESOURCE THICKENUP CLEAR, sodium chloride flush, traMADol  Xrays No results found.  Assessment/Plan: S/P Procedure(s) (LRB): REMOVAL OF IMPELLA LEFT VENTRICULAR ASSIST DEVICE (N/A) TRANSESOPHAGEAL ECHOCARDIOGRAM (TEE) (N/A)  1 clinically stable with steady overall improvement 2 ST concerned about RLN involvement based on MBS- will consider order CT with contrast(allergy would need steroids, etc) - tolerating po liquids, hopefully not aspirating 3 hemodyn stable in sinus rhythm, BP runs relatively low but stable 5 sugars fair control- cont to monitor and will need outpatient follow up for insulin resistance 6 sodium decreasing- meds per [facility] team as outlined  but may need adjustments to lasix   LOS: 22 days    John Giovanni 02/25/2018  I have seen and examined the patient and agree with the assessment and plan as outlined.  Ready for d/c to SNF  Rexene Alberts, MD 02/25/2018 8:36 AM

## 2018-02-25 NOTE — Progress Notes (Signed)
Per Kristin Bruins, RN patient is awaiting discharge to facility and does not want PICC removed until patient is leaving.  Carolee Rota, RN VAST

## 2018-02-25 NOTE — Care Management Note (Addendum)
  Case Management Note Previous CM note completed by [name], RN 02/18/2018, 4:10 PM    Patient Details  Name: Brittany [name] MRN: [id number] Date of Birth: [birth year]  Subjective/Objective: Pt is now s/p CABG and s/p removal of both Balloon pump and Impella                   Action/Plan:   PTA independent from home - pt's adult son stays in the home with her.  Pt has PCP and denied barriers to obtaining/medications.  Pt plans on staying with family in [place] for approximately 3 weeks at discharge.  Pt still requiring BIPAP but will need PT eval once stable.   Expected Discharge Date:  02/25/18               Expected Discharge Plan:  [place]  In-House Referral:  Clinical Social Work  Discharge planning Services  CM Consult, Medication Assistance  Post Acute Care Choice:  NA Choice offered to:  NA  DME Arranged:    DME Agency:     HH Arranged:    [place] Agency:     Status of Service:  Completed, signed off  If discussed at [employer] of Stay Meetings, dates discussed:  3/12, 3/14  Discharge Disposition: skilled facility   Additional Comments:   02/26/18- 1000- Marvetta Gibbons RN, CM- per DR. A- QC mtg pt will be HRI with [facility] pass through post SNF stay- CSW working on [place] with [facility] care for SNF placement.   02/25/18- 1110- Calissa Swenor RN, CM- pt for d/c today to STSNF- CSW following for placement needs- CM spoke with pt and provided contact info for [employer] [facility] so that pt can update her info with them while she is at the [facility]- also made sure pt is aware of her copay cost for Corlanor $45.   02/21/18- 1215- Marvetta Gibbons RN, CM- S/p CABG on 02/03/18- pt continues to need Bipap intermittently-  Per insurance check on Corlanor- Co-pay amount for" Corlanor" 5 mg BID $45.00 30 day supply.  No PA required.  [facility] wants patient to call in and give them the correct phone number ans address.  PH# for [employer]  is : [phone].  CM will provide info to pt for follow up with her insurance-  CSW following for STSNF placement   [name], RN 02/18/2018, 4:10 PM--[facility] request for pt to discharge to local SNF to ensure continuation of care.  CM completed Corlanor Copay Card online at bedside based on information provided by pt.  CM provided temporary card to bedside nurse and requested that paper be transported with pt at discharge.  CM also informed pt that paperwork will be given to her at discharge.  CSW following for placement  02/15/18--Samantha Claxton Pts resp status remains tenous - remains on BIPAP or HFNC.  Undergoing VAD work up   [place], [employer], RN 02/25/2018, 11:11 AM [phone] 4E Transition Care Coordinator

## 2018-02-25 NOTE — Progress Notes (Signed)
CSW spoke to admissions at [facility]. They continue to await patient's [facility] authorization for admission to SNF. CSW to follow.  Brittany [name], [place]

## 2018-02-25 NOTE — Progress Notes (Signed)
Verbal order received from Jadene Pierini, [place] to discontinue PICC.

## 2018-02-25 NOTE — Progress Notes (Addendum)
Patient ID: Brittany [name], female   DOB: 1961/07/13, 57 y.o.   MRN: [id number]      Advanced Heart Failure Rounding Note  PCP-Cardiologist: No primary care provider on file.   Subjective:    Events S/p emergent CABG 02/03/18 IABP removed 02/04/18 Impella out 02/08/18 Extubated 02/08/18  Nausea over weekend. Remains somewhat lightheadedness with walking halls.  No CP. Denies SOB.   Creatinine stable. K up to 5.5. Na down to 130. Weight has been stable.   ECHO 3/4 EF 25-30%. Grade II DD. Moderate-severe aortic insufficiency.   TEE 02/03/18 LVEF 20%, Mild to Mod AR, Mild/Mod MR, RV normal.   Objective:   Weight Range: 103 lb 4.8 oz (46.9 kg) Body mass index is 19.52 kg/m.   Vital Signs:   Temp:  [98.1 F (36.7 C)-98.5 F (36.9 C)] 98.3 F (36.8 C) (03/18 0529) Pulse Rate:  [57-85] 85 (03/18 0813) Resp:  [14-17] 15 (03/18 0529) BP: (99-108)/(60-68) 107/68 (03/18 0813) SpO2:  [92 %-100 %] 98 % (03/18 0731) Weight:  [103 lb 4.8 oz (46.9 kg)] 103 lb 4.8 oz (46.9 kg) (03/18 0529) Last BM Date: 02/22/18  Weight change: Filed Weights   02/23/18 0412 02/24/18 0534 02/25/18 0529  Weight: 117 lb 1 oz (53.1 kg) 102 lb 11.2 oz (46.6 kg) 103 lb 4.8 oz (46.9 kg)    Intake/Output:   Intake/Output Summary (Last 24 hours) at 02/25/2018 0815 Last data filed at 02/25/2018 0600 Gross per 24 hour  Intake 1187 ml  Output 1050 ml  Net 137 ml    Physical Exam   General: Fatigued.  HEENT: Normal Neck: Supple. JVP 7-8 cm. Carotids 2+ bilat; no bruits. No thyromegaly or nodule noted. Cor: PMI nondisplaced. RRR, 1/6 SEM RUSB Lungs: CTAB, normal effort. Abdomen: Soft, non-tender, non-distended, no HSM. No bruits or masses. +BS  Extremities: No cyanosis, clubbing, or rash. R and LLE no edema.  Neuro: Alert & orientedx3, cranial nerves grossly intact. moves all 4 extremities w/o difficulty. Affect pleasant   Telemetry   NSR 80s, personally reviewed.   Labs    CBC No results  for input(s): WBC, NEUTROABS, HGB, HCT, MCV, PLT in the last 72 hours. Basic Metabolic Panel Recent Labs    02/24/18 0500 02/25/18 0604  NA 133* 130*  K 5.3* 5.5*  CL 97* 93*  CO2 29 28  GLUCOSE 143* 163*  BUN 27* 27*  CREATININE 0.91 0.96  CALCIUM 9.4 9.4   Liver Function Tests No results for input(s): AST, ALT, ALKPHOS, BILITOT, PROT, ALBUMIN in the last 72 hours. No results for input(s): LIPASE, AMYLASE in the last 72 hours. Cardiac Enzymes No results for input(s): CKTOTAL, CKMB, CKMBINDEX, TROPONINI in the last 72 hours.  BNP: BNP (last 3 results) Recent Labs    02/03/18 0853 02/15/18 0449  BNP 3,127.4* 2,526.7*    ProBNP (last 3 results) No results for input(s): PROBNP in the last 8760 hours.   D-Dimer No results for input(s): DDIMER in the last 72 hours. Hemoglobin A1C No results for input(s): HGBA1C in the last 72 hours. Fasting Lipid Panel No results for input(s): CHOL, HDL, LDLCALC, TRIG, CHOLHDL, LDLDIRECT in the last 72 hours. Thyroid Function Tests No results for input(s): TSH, T4TOTAL, T3FREE, THYROIDAB in the last 72 hours.  Invalid input(s): FREET3  Other results:  Imaging   No results found.  Medications:     Scheduled Medications: . amiodarone  200 mg Oral Daily  . aspirin EC  81 mg Oral Daily  .  atorvastatin  80 mg Oral q1800  . Chlorhexidine Gluconate Cloth  6 each Topical Daily  . clopidogrel  75 mg Oral Daily  . digoxin  0.0625 mg Oral Daily  . enoxaparin (LOVENOX) injection  40 mg Subcutaneous Q24H  . feeding supplement (ENSURE ENLIVE)  237 mL Oral TID BM  . furosemide  40 mg Oral Daily  . ipratropium-albuterol  3 mL Nebulization BID  . ivabradine  5 mg Oral BID WC  . losartan  25 mg Oral Daily  . mouth rinse  15 mL Mouth Rinse BID  . pantoprazole  40 mg Oral Daily  . sodium chloride flush  10-40 mL Intracatheter Q12H    Infusions: . 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 She was admitted with NSTEMI and shock.  Emergent cath 02/03/18 showed LAD 100% stenosed, LCx 95% stenosed. Taken for emergent CABG 02/03/18  Assessment/Plan   1. 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  Cath with severe 2v CAD as above with severe LV dysfunction/ICM. s/p Emergent CABG 02/03/18.   - No s/s of ischemia.    - On aspirin, plavix and atorvastatin.  2. Acute systolic HF -> cardiogenic shock in setting of #1: TEE 02/03/18 LVEF 20%, Mild to Mod AR, Mild/Mod MR, RV normal. Impella out 3/1.  Echo 3/4 with moderate-severe aortic insufficiency which is likely contributing to CHF.  - No longer following CVP/Coox.  - Will plan to pull PICC prior to discharge. Leave in for now to avoid frequent sticks.  - Volume status stable on lasix 40 mg daily.  - Continue digoxin 0.125 mg daily - Hold Spiro with hyperK. Will re-attempt as outpatient once on more normal diet.  - Continue ivabradine 5 mg BID. - Continue losartan 25 mg daily. No room to increase with high K and intermittent lightheadedness   3. Atrial fibrillation: Paroxysmal.  Remains in NSR.  CHA2DS2/VASc is at least 4. (CHF, Vasc disease, HTN, Female).   - She has history of Afib RVR in the past in the setting of her Lung CA. Previously on Xarelto but stopped her medications. Dr. Roxy Manns OK.  - Continue amiodarone 200 mg daily. Remains in NSR.  - Will continue ASA and Plavix per discussion with Dr. [name] and Dr. Roxy Manns.  4. H/o SCLC: s/p treatment 2015. Lost to f/u since  04/2015. Will need f/u with oncology (Dr. Julien Nordmann) as outpatient.  - Chest CT 02/19/42 with mass-like consolidation in R hilum concerning for recurrent tumor.  - Speech to evaluate today. If concerns for RLN palsy with h/o XRT, will do Chest CT w/ contrast.  - She will need outpatient follow up in the cancer center. No change.  5. Anemia:  - Hgb stable.  6. Aortic insufficiency: Moderate-severe on postop echo.  Likely potentiating CHF.   - Added afterload reduction with ARB. No change.  7. ID: Covering for PNA with vancomycin/ceftazidime. - Vanc stopped 02/17/18. - Psuedomonas in urine. Finished ceftaz 02/20/18 - Resolved. Afebrile.  8. 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  9. Hyperkalemia - Drinking ensure and V8 (  low sodium).  Arlyce Harman now held. Limit V8s with high potassium content (600 mg) (approx 10 meq of K, and having multiples a day per pt)  OK for SNF, but will need repeat BMET in several days (would aim for Thursday so results prior to weekend).  HF follow up 03/05/18. Would leave lasix at 40 mg daily for now. Can cut back to 20 mg daily as needed. Limit potassium in diet.   HF meds for discharge Amiodarone 200 mg BID Lasix 40 mg daily Digoxin 0.125 mg daily Ivabridine 5 mg daily Losartan 25 mg daily ASA 81 mg daily Plavix 75 mg daily  Spiro STOPPED with hyperK  Will schedule close HF follow up.   Length of Stay: [place], [place]  02/25/2018, 8:15 AM  Advanced Heart Failure Team Pager [phone] (M-F; 7a - 4p)  Please contact [facility] for night-coverage after hours (4p -7a ) and weekends on [email or web address]  Patient seen and examined with the above-signed Advanced Practice Provider and/or Housestaff. I personally reviewed laboratory data, imaging studies and relevant notes. I independently examined the patient and formulated the important aspects of the plan. I have edited the note to reflect any of my changes or salient points. I have  personally discussed the plan with the patient and/or family.  She is ready for d/c today. Agree with above plan. Stop spiro and arrange for close f/u in HF Clinic.   Glori Bickers, MD  12:24 PM

## 2018-02-26 ENCOUNTER — Telehealth (HOSPITAL_COMMUNITY): Payer: Self-pay

## 2018-02-26 MED ORDER — MAGNESIUM CITRATE PO SOLN
300.0000 mL | Freq: Once | ORAL | Status: AC
  Administered 2018-02-26: 300 mL via ORAL
  Filled 2018-02-26: qty 592

## 2018-02-26 NOTE — Progress Notes (Signed)
Occupational Therapy Treatment Patient Details Name: Brittany [name] MRN: [id number] DOB: 1961/10/14 Today's Date: 02/26/2018    History of present illness Pt is a 57 y.o. female s/p emergent CABGx3 on 02/03/18. IABP removed 2/25. Impella removed 3/1. Intubated 2/24-3/1. PMH includes small cell lung cancer (s/p treatment 2015), HTN, PAF, CHF, asthma, anxiety.   OT comments  Pt progressing towards OT goals, though agitated during session, partly due to discomfort as pt feels she needs to have BM. Pt completing room and hallway level functional mobility using RW with MinGuard-MinA (+2 for safety), Pt requires MAX verbal cues for safety including self monitoring speed of mobility as well as to take standing rest breaks to monitor SpO2 sats, with pt intermittently adhering to cues (difficult to maintain accurate SpO2 due to pt hand position on RW). Pt completing seated grooming ADLs with setup assist and toileting with ModA. Feel SNF recommendation remains appropriate at this time. Will continue to follow acutely to progress pt towards established OT goals.     Follow Up Recommendations  SNF    Equipment Recommendations  Other (comment)(defer to next venue )          Precautions / Restrictions Precautions Precautions: Fall;Sternal Precaution Comments: Verbally reviewed Restrictions Weight Bearing Restrictions: Yes RUE Weight Bearing: (sternal precautions) LUE Weight Bearing: (sternal precautions) Other Position/Activity Restrictions: Sternal       Mobility Bed Mobility Overal bed mobility: Needs Assistance Bed Mobility: Sit to Sidelying         Sit to sidelying: Supervision General bed mobility comments: supervision for safety   Transfers Overall transfer level: Needs assistance Equipment used: Rolling walker (2 wheeled) Transfers: Sit to/from Stand Sit to Stand: Min guard         General transfer comment: MinGuard for safety and to ensure sternal precautions      Balance Overall balance assessment: Needs assistance Sitting-balance support: Feet supported;No upper extremity supported Sitting balance-Leahy Scale: Good     Standing balance support: Bilateral upper extremity supported Standing balance-Leahy Scale: Poor Standing balance comment: Requires UE support/external support when standing without UE support                            ADL either performed or assessed with clinical judgement   ADL Overall ADL's : Needs assistance/impaired     Grooming: Set up;Sitting;Wash/dry face;Oral care                   Toilet Transfer: Minimal assistance;Stand-pivot;BSC   Toileting- Clothing Manipulation and Hygiene: Moderate assistance;Sit to/from stand Toileting - Clothing Manipulation Details (indicate cue type and reason): for gown management and management of mesh underwear      Functional mobility during ADLs: Minimal assistance;+2 for safety/equipment;Rolling walker General ADL Comments: pt frustrated and appearing agitated during session due to discomfort/needing to have bowel movement. pt completing room and hallway level functional mobility with minguard (+2 for safety and lines); pt requires cues throughout for safety as pt moving very quickly and refuses to stop for rest breaks; requires Max verbal cues to take standing rest break and for deep breathing techniques; unable to maintain accurate SpO2 (unsteady wave form - pt with very tight grip on RW), as sats reading into low 70's/high 60's though pt does not display s/s. SpO2 88-95% on 3L when able to maintain steady reading  Cognition Arousal/Alertness: Awake/alert Behavior During Therapy: Agitated Overall Cognitive Status: Within Functional Limits for tasks assessed                                 General Comments: Pt appearing agitated throughout session and requiring verbal safety cues including MAX verbal cues to take  standing rest breaks with activity and to maintain safe speed during ambulation                          Pertinent Vitals/ Pain       Pain Assessment: Faces Faces Pain Scale: Hurts little more Pain Location: incision, stomach (needing to have BM) Pain Descriptors / Indicators: Operative site guarding;Grimacing Pain Intervention(s): Monitored during session                                                          Frequency  Min 2X/week        Progress Toward Goals  OT Goals(current goals can now be found in the care plan section)  Progress towards OT goals: Progressing toward goals  Acute Rehab OT Goals Patient Stated Goal: Be able to do something for myself OT Goal Formulation: With patient Time For Goal Achievement: 03/06/18 Potential to Achieve Goals: Good  Plan Discharge plan remains appropriate    Co-evaluation    PT/OT/SLP Co-Evaluation/Treatment: Yes Reason for Co-Treatment: Necessary to address cognition/behavior during functional activity;For patient/therapist safety   OT goals addressed during session: ADL's and self-care;Proper use of Adaptive equipment and DME      AM-PAC PT "6 Clicks" Daily Activity     Outcome Measure   Help from another person eating meals?: A Little Help from another person taking care of personal grooming?: A Little Help from another person toileting, which includes using toliet, bedpan, or urinal?: A Lot Help from another person bathing (including washing, rinsing, drying)?: A Lot Help from another person to put on and taking off regular upper body clothing?: A Little Help from another person to put on and taking off regular lower body clothing?: A Lot 6 Click Score: 15    End of Session Equipment Utilized During Treatment: Oxygen  OT Visit Diagnosis: Muscle weakness (generalized) (M62.81)   Activity Tolerance Patient tolerated treatment well;Treatment limited secondary to agitation    Patient Left in bed;with call bell/phone within reach   Nurse Communication Mobility status        Time: 5537-4827 OT Time Calculation (min): 30 min  Charges: OT General Charges $OT Visit: 1 Visit OT Treatments $Self Care/Home Management : 8-22 mins  Lou Cal, OT Pager [phone] 02/26/2018    Raymondo Band 02/26/2018, 1:45 PM

## 2018-02-26 NOTE — Progress Notes (Signed)
CSW following for discharge plan. Patient continues to wait for insurance authorization through [facility] to admit to SNF. CSW checked in with facility throughout the day today on status; still no word from [facility] on whether or not they will approve patient for SNF placement at this time. SNF will not accept patient without prior approval from insurance. Facility will not accept LOG pending insurance authorization for patient to admit prior to approval.  CSW will continue to follow.  Laveda Abbe, [place] Clinical Social Worker [phone]

## 2018-02-26 NOTE — Progress Notes (Addendum)
Nutrition Follow-up  DOCUMENTATION CODES:   Severe malnutrition in context of acute illness/injury  INTERVENTION:    Ensure Enlive po TID, each supplement provides 350 kcal and 20 grams of protein  Magic cup TID with meals, each supplement provides 290 kcal and 9 grams of protein  NUTRITION DIAGNOSIS:   Severe Malnutrition related to acute illness(emergent CABG, heart failure, respiratory failure requiring vent/BiPap) as evidenced by energy intake < or equal to 50% for > or equal to 5 days, moderate muscle depletion, mild fat depletion, ongoing  GOAL:   Patient will meet greater than or equal to 90% of their needs, improved  MONITOR:   PO intake, Supplement acceptance, Labs, Skin, Weight trends, I & O's  ASSESSMENT:   Pt with PMH of small cell lung cancer s/p chemotherapy and radiation (2016), HTN, and CHF presents with cardiogenic shock in setting of STEMI. Pt required emergent CABG 02/03/2018 with Impella placement. Pt remains intubated post-operatively.   Pt s/p MBSS 3/15. Advanced to Regular diet. PO intake improving but limited at 25-50% per flowsheets. She is drinking at least 1 Ensure Enlive supplements per day.  RD encouraged pt to try and drink 2 supplements per day. Labs reviewed. Na 130 (L). K 5.5 (H). Medications include Colace.  Awaiting insurance approval for SNF.  Diet Order:  Diet regular Room service appropriate? Yes; Fluid consistency: Thin  EDUCATION NEEDS:   Not appropriate for education at this time  Skin:  Skin Assessment: Skin Integrity Issues: Skin Integrity Issues:: Incisions Incisions: chest and R leg  Last BM:  3/19   Intake/Output Summary (Last 24 hours) at 02/26/2018 1532 Last data filed at 02/26/2018 1200 Gross per 24 hour  Intake 1080 ml  Output 850 ml  Net 230 ml   Height:   Ht Readings from Last 1 Encounters:  02/06/18 5\' 1"  (1.549 m)   Weight:   Wt Readings from Last 1 Encounters:  02/26/18 101 lb 9.6 oz (46.1 kg)    Ideal Body Weight:  47.7 kg  BMI:  Body mass index is 19.2 kg/m.  Estimated Nutritional Needs:   Kcal:  1500-1700  Protein:  75-90 gm  Fluid:  >/= 1.5 L  Arthur Holms, RD, LDN Pager #: [phone] After-Hours Pager #: [phone]

## 2018-02-26 NOTE — Telephone Encounter (Signed)
Patients insurance is active and benefits verified through [facility] - No co-pay, deductible amount of $500.00/$500.00 has been met, out of pocket amount of $2,600/$2,600 has been met, 20% co-insurance, and no pre-authorization is requested. Passport/reference [phone]  Will contact patient to see if interested in the CR program. If interested, patient will need to complete follow up appt. Once follow up appt is completed, patient will be contacted for scheduling.

## 2018-02-26 NOTE — Progress Notes (Addendum)
      [address]       [employer] [address]             [phone]      18 Days Post-Op Procedure(s) (LRB): REMOVAL OF IMPELLA LEFT VENTRICULAR ASSIST DEVICE (N/A) TRANSESOPHAGEAL ECHOCARDIOGRAM (TEE) (N/A) Subjective: Awaiting insurance approval for SNF  Objective: Vital signs in last 24 hours: Temp:  [97.7 F (36.5 C)-98.3 F (36.8 C)] 97.7 F (36.5 C) (03/19 0338) Pulse Rate:  [76-85] 77 (03/19 0338) Cardiac Rhythm: Normal sinus rhythm (03/19 0338) Resp:  [12-20] 20 (03/19 0338) BP: (95-122)/(61-75) 112/61 (03/19 0338) SpO2:  [98 %-100 %] 100 % (03/19 0338) Weight:  [101 lb 9.6 oz (46.1 kg)] 101 lb 9.6 oz (46.1 kg) (03/19 0506)  Hemodynamic parameters for last 24 hours:    Intake/Output from previous day: 03/18 0701 - 03/19 0700 In: 1080 [P.O.:1080] Out: 850 [Urine:850] Intake/Output this shift: No intake/output data recorded.  General appearance: alert, cooperative and no distress Heart: regular rate and rhythm Lungs: dim right >left base Abdomen: benign Extremities: no edema Wound: incis healing well  Lab Results: No results for input(s): WBC, HGB, HCT, PLT in the last 72 hours. BMET:  Recent Labs    02/24/18 0500 02/25/18 0604  NA 133* 130*  K 5.3* 5.5*  CL 97* 93*  CO2 29 28  GLUCOSE 143* 163*  BUN 27* 27*  CREATININE 0.91 0.96  CALCIUM 9.4 9.4    PT/INR: No results for input(s): LABPROT, INR in the last 72 hours. ABG    Component Value Date/Time   PHART 7.433 02/09/2018 0415   HCO3 28.0 02/09/2018 0415   TCO2 40 (H) 02/16/2018 1435   ACIDBASEDEF 1.0 02/08/2018 2117   O2SAT 66.0 02/19/2018 0345   CBG (last 3)  No results for input(s): GLUCAP in the last 72 hours.  Meds Scheduled Meds: . amiodarone  200 mg Oral Daily  . aspirin EC  81 mg Oral Daily  . atorvastatin  80 mg Oral q1800  . Chlorhexidine Gluconate Cloth  6 each Topical Daily  . clopidogrel  75 mg Oral Daily  . digoxin  0.0625 mg Oral Daily  .  enoxaparin (LOVENOX) injection  40 mg Subcutaneous Q24H  . feeding supplement (ENSURE ENLIVE)  237 mL Oral TID BM  . furosemide  40 mg Oral Daily  . ipratropium-albuterol  3 mL Nebulization BID  . ivabradine  5 mg Oral BID WC  . losartan  25 mg Oral Daily  . mouth rinse  15 mL Mouth Rinse BID  . pantoprazole  40 mg Oral Daily  . sodium chloride flush  10-40 mL Intracatheter Q12H   Continuous Infusions: . sodium chloride Stopped (02/18/18 1540)   PRN Meds:.ALPRAZolam, bisacodyl **OR** bisacodyl, docusate sodium, levalbuterol, metoprolol tartrate, morphine injection, ondansetron (ZOFRAN) IV, oxyCODONE, RESOURCE THICKENUP CLEAR, sodium chloride flush, traMADol  Xrays No results found.  Assessment/Plan: S/P Procedure(s) (LRB): REMOVAL OF IMPELLA LEFT VENTRICULAR ASSIST DEVICE (N/A) TRANSESOPHAGEAL ECHOCARDIOGRAM (TEE) (N/A)  1 doing well 2 some anxiety at times leading to perception of shortness of breath, cont with 2 l [place], push pulm toilet/rehab as able 3 hemodyn stable in sinus rhythm 4 no new labs 5 SNF when bed available on current rx    LOS: 23 days    John Giovanni 02/26/2018  I have seen and examined the patient and agree with the assessment and plan as outlined.  Rexene Alberts, MD 02/26/2018 3:25 PM

## 2018-02-26 NOTE — Progress Notes (Signed)
Pt upset and verbally aggressive with RN because she feels constipated. Has had two small BM today. Pt did take PRN dulcolax this AM. RN has offered prune juice, coffee, walks in the hallway and PRN suppository. Pt has declined all offers. Will continue to monitor.  Clyde Canterbury, RN

## 2018-02-26 NOTE — Progress Notes (Signed)
Pt stated that she fell to knees while getting up from [facility] to bed. Fall unwitnessed. RN found pt back in bed. Pt assessed, knees with no bruising or pain on palpation. VSS. Bed alarm on. Dr. Roxan Hockey made aware. Will continue to monitor.  Clyde Canterbury, RN

## 2018-02-26 NOTE — Progress Notes (Signed)
Patient call c/o S.O.B. Skin warm and dry, resp. Even and unlab. V.S.S. O2 sats 100% on 2 liters of O2. R.N. Aware Resp. Ther. Call to put patient on BiPaP. Xanax 0.25 mg p.o. Given .  Patient states, " I am not sure if it is my breathing or anxiety but I need the BiPaP on."

## 2018-02-26 NOTE — Progress Notes (Addendum)
Physical Therapy Treatment Patient Details Name: Brittany [name] MRN: [id number] DOB: Jun 01, 1961 Today's Date: 02/26/2018    History of Present Illness Pt is a 57 y.o. female s/p emergent CABGx3 on 02/03/18. IABP removed 2/25. Impella removed 3/1. Intubated 2/24-3/1. PMH includes small cell lung cancer (s/p treatment 2015), HTN, PAF, CHF, asthma, anxiety.    PT Comments    Patient received sitting at edge of bed, irritated and agitated and reporting high levels of frustration over not being able to have a BM. She is agreeable to ambulation in the hallway to assist in bowel motility, however requires Mod progressing to Max cues for pacing of gait and safety with RW in room and in hallway. She did demonstrate SpO2 drop into 70s-80s during gait and also required Max cues to stop and perform pursed lip breathing, but ultimately required increase of O2 to 3LPM to maintain sats from 88- 90s today. She was left sitting at EOB with OT present and attending, all needs otherwise met.     Follow Up Recommendations  SNF     Equipment Recommendations  None recommended by PT(TBD at next venue )    Recommendations for Other Services       Precautions / Restrictions Precautions Precautions: Fall;Sternal Precaution Booklet Issued: Yes (comment) Precaution Comments: Verbally reviewed Restrictions Weight Bearing Restrictions: Yes RUE Weight Bearing: (sternal precautions) LUE Weight Bearing: (sternal precautions) Other Position/Activity Restrictions: Sternal precautions     Mobility  Bed Mobility Overal bed mobility: Needs Assistance Bed Mobility: Sit to Sidelying         Sit to sidelying: Supervision General bed mobility comments: DNT, received sitting up at bedside   Transfers Overall transfer level: Needs assistance Equipment used: Rolling walker (2 wheeled) Transfers: Sit to/from Stand Sit to Stand: Min guard         General transfer comment: MinGuard for safety and to  ensure sternal precautions   Ambulation/Gait Ambulation/Gait assistance: Min guard Ambulation Distance (Feet): 150 Feet Assistive device: Rolling walker (2 wheeled) Gait Pattern/deviations: Step-through pattern;Decreased step length - right;Decreased step length - left Gait velocity: decr   General Gait Details: Mod cues for RW management and pacing throughout gait today, increased to Max cues in room as patient tends to go much faster than is safe; required Max cues in hall to stop and take standing breaks to maintain SpO2, which dropped into 80s during gait on 2LPM, required increase to 3LPM to maintain in 90s    Stairs            Wheelchair Mobility    Modified Rankin (Stroke Patients Only)       Balance Overall balance assessment: Needs assistance Sitting-balance support: Feet supported;No upper extremity supported Sitting balance-Leahy Scale: Good     Standing balance support: Bilateral upper extremity supported Standing balance-Leahy Scale: Fair Standing balance comment: Requires UE support/external support when standing without UE support                             Cognition Arousal/Alertness: Awake/alert Behavior During Therapy: Agitated Overall Cognitive Status: Within Functional Limits for tasks assessed                                 General Comments: Pt appearing agitated throughout session and requiring verbal safety cues including MAX verbal cues to take standing rest breaks with activity and to maintain safe speed during  ambulation      Exercises      General Comments        Pertinent Vitals/Pain Pain Assessment: Faces Faces Pain Scale: Hurts little more Pain Location: incision, stomach (needing to have BM) Pain Descriptors / Indicators: Operative site guarding;Grimacing Pain Intervention(s): Monitored during session    Home Living                      Prior Function            PT Goals (current  goals can now be found in the care plan section) Acute Rehab PT Goals Patient Stated Goal: Be able to do something for myself PT Goal Formulation: With patient Time For Goal Achievement: 03/05/18 Potential to Achieve Goals: Good Progress towards PT goals: Progressing toward goals    Frequency    Min 3X/week      PT Plan Current plan remains appropriate    Co-evaluation   Reason for Co-Treatment: Necessary to address cognition/behavior during functional activity;For patient/therapist safety   OT goals addressed during session: ADL's and self-care;Proper use of Adaptive equipment and DME      AM-PAC PT "6 Clicks" Daily Activity  Outcome Measure  Difficulty turning over in bed (including adjusting bedclothes, sheets and blankets)?: A Little Difficulty moving from lying on back to sitting on the side of the bed? : A Little Difficulty sitting down on and standing up from a chair with arms (e.g., wheelchair, bedside commode, etc,.)?: A Little Help needed moving to and from a bed to chair (including a wheelchair)?: A Little Help needed walking in hospital room?: A Little Help needed climbing 3-5 steps with a railing? : A Lot 6 Click Score: 17    End of Session Equipment Utilized During Treatment: Gait belt;Oxygen Activity Tolerance: Patient limited by fatigue;Treatment limited secondary to agitation Patient left: in bed;with call bell/phone within reach;Other (comment)(sitting at edge of bed with OT present and attending ) Nurse Communication: Mobility status PT Visit Diagnosis: Other abnormalities of gait and mobility (R26.89);Muscle weakness (generalized) (M62.81)     Time: 1791-5056 PT Time Calculation (min) (ACUTE ONLY): 15 min  Charges:  $Gait Training: 8-22 mins                    G Codes:       Deniece Ree PT, DPT, CBIS  Supplemental Physical Therapist [place]   Pager [phone]

## 2018-02-27 ENCOUNTER — Telehealth (HOSPITAL_COMMUNITY): Payer: Self-pay

## 2018-02-27 LAB — URINALYSIS, ROUTINE W REFLEX MICROSCOPIC
BILIRUBIN URINE: NEGATIVE
Glucose, UA: NEGATIVE mg/dL
Hgb urine dipstick: NEGATIVE
Ketones, ur: NEGATIVE mg/dL
NITRITE: NEGATIVE
PROTEIN: NEGATIVE mg/dL
SPECIFIC GRAVITY, URINE: 1.014 (ref 1.005–1.030)
pH: 8 (ref 5.0–8.0)

## 2018-02-27 LAB — CBC
HEMATOCRIT: 28.8 % — AB (ref 36.0–46.0)
HEMOGLOBIN: 8.9 g/dL — AB (ref 12.0–15.0)
MCH: 28.9 pg (ref 26.0–34.0)
MCHC: 30.9 g/dL (ref 30.0–36.0)
MCV: 93.5 fL (ref 78.0–100.0)
Platelets: 467 10*3/uL — ABNORMAL HIGH (ref 150–400)
RBC: 3.08 MIL/uL — ABNORMAL LOW (ref 3.87–5.11)
RDW: 16.2 % — ABNORMAL HIGH (ref 11.5–15.5)
WBC: 12.8 10*3/uL — ABNORMAL HIGH (ref 4.0–10.5)

## 2018-02-27 LAB — BASIC METABOLIC PANEL
ANION GAP: 9 (ref 5–15)
BUN: 24 mg/dL — ABNORMAL HIGH (ref 6–20)
CO2: 29 mmol/L (ref 22–32)
Calcium: 9.5 mg/dL (ref 8.9–10.3)
Chloride: 95 mmol/L — ABNORMAL LOW (ref 101–111)
Creatinine, Ser: 0.93 mg/dL (ref 0.44–1.00)
GFR calc Af Amer: >60 mL/min (ref >60)
GFR calc non Af Amer: >60 mL/min (ref >60)
GLUCOSE: 146 mg/dL — AB (ref 65–99)
POTASSIUM: 4.7 mmol/L (ref 3.5–5.1)
Sodium: 133 mmol/L — ABNORMAL LOW (ref 135–145)

## 2018-02-27 NOTE — Progress Notes (Signed)
Pt declines walking at this time. She is trying to talk with CM (? From her insurance). Reviewed some education with pt and reinforced importance of mobility and lasix (she mentioned she didn't want to take it due to frequency of urination). She asks [place] to come back this afternoon. We will try as time allows. [id number] [place], ACSM 11:28 AM 02/27/2018

## 2018-02-27 NOTE — Progress Notes (Signed)
Clinical Social Worker following patient for discharge needs. Patient choice bed at [facility] and patient is awaiting approval from BC/BS. CSW met patient at bedside to assist her with phone call to BC/BS. CSW spoke with representative from insurance company to get reason to why approval is taking such a long time. Representative at [place] was unable to answer CSW question and kept transferring patient and CSW from one representative to another. At this time CSW is continuing to follow patient for discharge needs.   CSW contacted [facility] to see if they will be able to take patient with an LOG and admissions coordinator stated they are unable to take patient without prior authorization.   Rhea Pink, MSW,  [place]

## 2018-02-27 NOTE — Progress Notes (Signed)
Patient for discharge via [facility], AVS sent with patient. PICC line removed. Patients belongings packed and sent with her. Pt had no questions. PT[facility] left at 2000.

## 2018-02-27 NOTE — Progress Notes (Addendum)
      [address]       [place] [address]             [phone]      19 Days Post-Op Procedure(s) (LRB): REMOVAL OF IMPELLA LEFT VENTRICULAR ASSIST DEVICE (N/A) TRANSESOPHAGEAL ECHOCARDIOGRAM (TEE) (N/A) Subjective: Feels anxious and upset about frequent voiding leading to "accidents" because she can't get to bathroom in time  Objective: Vital signs in last 24 hours: Temp:  [97.6 F (36.4 C)-99 F (37.2 C)] 97.6 F (36.4 C) (03/20 0400) Pulse Rate:  [25-116] 77 (03/20 0400) Cardiac Rhythm: Normal sinus rhythm (03/20 0700) Resp:  [14-22] 14 (03/20 0400) BP: (93-124)/(49-73) 100/60 (03/20 0400) SpO2:  [90 %-100 %] 99 % (03/20 0400)  Hemodynamic parameters for last 24 hours:    Intake/Output from previous day: 03/19 0701 - 03/20 0700 In: 1320 [P.O.:1320] Out: 2655 [Urine:2650; Stool:5] Intake/Output this shift: No intake/output data recorded.  General appearance: alert, cooperative and no distress Heart: regular rate and rhythm Lungs: min dim in bases Abdomen: benign Extremities: no edema Wound: incis healing well  Lab Results: Recent Labs    02/27/18 0353  WBC 12.8*  HGB 8.9*  HCT 28.8*  PLT 467*   BMET:  Recent Labs    02/25/18 0604 02/27/18 0353  NA 130* 133*  K 5.5* 4.7  CL 93* 95*  CO2 28 29  GLUCOSE 163* 146*  BUN 27* 24*  CREATININE 0.96 0.93  CALCIUM 9.4 9.5    PT/INR: No results for input(s): LABPROT, INR in the last 72 hours. ABG    Component Value Date/Time   PHART 7.433 02/09/2018 0415   HCO3 28.0 02/09/2018 0415   TCO2 40 (H) 02/16/2018 1435   ACIDBASEDEF 1.0 02/08/2018 2117   O2SAT 66.0 02/19/2018 0345   CBG (last 3)  No results for input(s): GLUCAP in the last 72 hours.  Meds Scheduled Meds: . amiodarone  200 mg Oral Daily  . aspirin EC  81 mg Oral Daily  . atorvastatin  80 mg Oral q1800  . Chlorhexidine Gluconate Cloth  6 each Topical Daily  . clopidogrel  75 mg Oral Daily  . digoxin  0.0625 mg  Oral Daily  . enoxaparin (LOVENOX) injection  40 mg Subcutaneous Q24H  . feeding supplement (ENSURE ENLIVE)  237 mL Oral TID BM  . furosemide  40 mg Oral Daily  . ipratropium-albuterol  3 mL Nebulization BID  . ivabradine  5 mg Oral BID WC  . losartan  25 mg Oral Daily  . mouth rinse  15 mL Mouth Rinse BID  . pantoprazole  40 mg Oral Daily  . sodium chloride flush  10-40 mL Intracatheter Q12H   Continuous Infusions: . sodium chloride Stopped (02/18/18 1540)   PRN Meds:.ALPRAZolam, bisacodyl **OR** bisacodyl, docusate sodium, levalbuterol, metoprolol tartrate, morphine injection, ondansetron (ZOFRAN) IV, oxyCODONE, RESOURCE THICKENUP CLEAR, sodium chloride flush, traMADol  Xrays No results found.  Assessment/Plan: S/P Procedure(s) (LRB): REMOVAL OF IMPELLA LEFT VENTRICULAR ASSIST DEVICE (N/A) TRANSESOPHAGEAL ECHOCARDIOGRAM (TEE) (N/A)  1 check UA with significant urine frequency not totally explained by diuretic 2 hemodyn stable in sinus 3 BS control adeq 4 conts to tol diet 5 had a BM 6 awaiting insurance approval for SNF  LOS: 24 days    Brittany [name] 02/27/2018  I have seen and examined the patient and agree with the assessment and plan as outlined.    Rexene Alberts, MD 02/27/2018 9:03 AM

## 2018-02-27 NOTE — Progress Notes (Signed)
Clinical Social Worker facilitated patient discharge including contacting patient family and facility to confirm patient discharge plans.  Clinical information faxed to facility and family agreeable with plan.  CSW arranged ambulance transport via [facility] to [facility] .  RN to call [phone] (pt will go in room 107A) for report prior to discharge.  Clinical Social Worker will sign off for now as social work intervention is no longer needed. Please consult [place] again if new need arises.  Rhea Pink, MSW, [place]

## 2018-02-27 NOTE — Telephone Encounter (Signed)
Attempted to call patient to see if interested in CR - Lm on vm

## 2018-02-27 NOTE — Progress Notes (Signed)
CSW following patient for discharge needs. Patient has chosen a bed at [facility]. [facility]) started authorization process through patients insurance Monday March 18th. At this time [facility] is still waiting for patients insurance company to approved SNF placement. CSW asked [facility] administrator if they will be able to take a 5 day LOG from the hospital to allow patient to discharge today. Administrator Crecencio Mc stated she will ask her higher up about accepting an LOG on behalf of patient but stated she will ask later in the afternoon to give BC/BSan opportunity to get back to her today.   Rhea Pink, MSW,  [place]

## 2018-02-28 ENCOUNTER — Other Ambulatory Visit: Payer: Self-pay | Admitting: *Deleted

## 2018-02-28 ENCOUNTER — Encounter: Payer: Self-pay | Admitting: *Deleted

## 2018-02-28 DIAGNOSIS — C3490 Malignant neoplasm of unspecified part of unspecified bronchus or lung: Secondary | ICD-10-CM

## 2018-02-28 LAB — URINE CULTURE: CULTURE: NO GROWTH

## 2018-02-28 NOTE — Progress Notes (Signed)
Called patient to give 13-hour prep information and to find out which drug store to call it in to.  Patient's cell/home phone has no mailbox set up.  She is no longer employed at the work number (I removed it from epic).  I spoke with her sister, Olin Hauser, who is listed as a contact in epic.  She states this patient is in rehab, and will be for another three weeks, following a "massive heart attack."   I explained to the sister about the patient coming as a walk-in for a "stat" CT, but that the 13-hour prep needs to be taken 13, 7 and 1 hour before the contrast is administered; thus, the CT needs to be scheduled.  Olin Hauser stated she is going to the rehab facility now and will give them our number [phone]) to coordinate all of this.   Brita Romp, RN

## 2018-03-04 ENCOUNTER — Telehealth (HOSPITAL_COMMUNITY): Payer: Self-pay

## 2018-03-04 NOTE — Telephone Encounter (Signed)
CHF Clinic appointment reminder call placed to patient for upcoming post-hospital follow up.  Does understand purpose of this appointment and where CHF Clinic is located? Patient forgot about appointment and will need to call back to reschedule due to conflict in scheduled appointments.  How is patient feeling? Well, no complaints  Does patient have all of their medications since their recent discharge? Yes  Patient also reminded to take all medications as prescribed on the day of his/her appointment and to bring all medications to this appointment.  Advised to call our office for tardiness or cancellations/rescheduling needs.  Leory Plowman, Guinevere Ferrari

## 2018-03-05 ENCOUNTER — Inpatient Hospital Stay (HOSPITAL_COMMUNITY): Admit: 2018-03-05 | Payer: BLUE CROSS/BLUE SHIELD

## 2018-03-07 ENCOUNTER — Ambulatory Visit
Admission: RE | Admit: 2018-03-07 | Discharge: 2018-03-07 | Disposition: A | Discharge status: Home / Self Care | Payer: BLUE CROSS/BLUE SHIELD | Source: Ambulatory Visit | Attending: Thoracic Surgery (Cardiothoracic Vascular Surgery) | Admitting: Thoracic Surgery (Cardiothoracic Vascular Surgery)

## 2018-03-07 DIAGNOSIS — C3490 Malignant neoplasm of unspecified part of unspecified bronchus or lung: Secondary | ICD-10-CM

## 2018-03-14 ENCOUNTER — Ambulatory Visit (INDEPENDENT_AMBULATORY_CARE_PROVIDER_SITE_OTHER): Payer: BLUE CROSS/BLUE SHIELD | Admitting: Physician Assistant

## 2018-03-14 ENCOUNTER — Encounter: Payer: Self-pay | Admitting: Physician Assistant

## 2018-03-14 VITALS — BP 102/58 | HR 78 | Ht 59.5 in | Wt 102.4 lb

## 2018-03-14 DIAGNOSIS — I480 Paroxysmal atrial fibrillation: Secondary | ICD-10-CM | POA: Diagnosis not present

## 2018-03-14 DIAGNOSIS — Z79899 Other long term (current) drug therapy: Secondary | ICD-10-CM

## 2018-03-14 DIAGNOSIS — I519 Heart disease, unspecified: Secondary | ICD-10-CM

## 2018-03-14 DIAGNOSIS — I10 Essential (primary) hypertension: Secondary | ICD-10-CM

## 2018-03-14 DIAGNOSIS — I351 Nonrheumatic aortic (valve) insufficiency: Secondary | ICD-10-CM

## 2018-03-14 DIAGNOSIS — I25810 Atherosclerosis of coronary artery bypass graft(s) without angina pectoris: Secondary | ICD-10-CM | POA: Diagnosis not present

## 2018-03-14 NOTE — Patient Instructions (Signed)
Medication Instructions: Almyra Deforest, PA-c recommends that you continue on your current medications as directed. Please refer to the Current Medication list given to you today.  Labwork: Your physician recommends that you return for lab work TODAY.  Testing/Procedures: 1. Limited Echocardiogram after 03/20/2018 - Your physician has requested that you have an echocardiogram. Echocardiography is a painless test that uses sound waves to create images of your heart. It provides your doctor with information about the size and shape of your heart and how well your heart's chambers and valves are working. This procedure takes approximately one hour. There are no restrictions for this procedure. >>This will be performed at our [facility] location [place], [place] [address] [phone]  Follow-up: Please keep your appointment in the heart failure clinic on 03/20/18  If you need a refill on your cardiac medications before your next appointment, please call your pharmacy.

## 2018-03-14 NOTE — Progress Notes (Signed)
Cardiology Office Note    Date:  03/15/2018   ID:  Brittany [name], DOB 10-15-1961, MRN [id number]  PCP:  System, Pcp Not In  Cardiologist:  Dr. Marlou Porch  Chief Complaint  Patient presents with  . Hospitalization Follow-up    seen for Dr. Marlou Porch. post CABG    History of Present Illness:  Brittany [name] is a 57 y.o. female with PMH of PAF, HTN, small cell lung CA, and CAD s/p CABG x3.  EKG in 2015 confirmed atrial fibrillation.  Patient was placed on Xarelto but only took this for a short while.  Patient had a small cell lung cancer and was admitted in April 2016 with complaint of cough and shortness of breath, and IV antibiotic was administered, blood culture showed no growth.  She finished systemic chemotherapy with 6 cycles and radiation therapy.  He also had previous EKG demonstrating sinus rhythm with PACs at the hospital as multifocal atrial tachycardia.  Given her CHA2DS2-Vasc score of 2, anticoagulation was recommended previously, currently, she is not on anticoagulation until we have clear evidence she is having recurrent atrial fibrillation.  A 30-day monitor was recommended during the last office visit on 12/19/2017.  Because of chest pain, she was also recommended to have a stress test.  Before she could have her stress test, she was admitted to the hospital on 02/03/2018 with chest tightness and shortness of breath.  On initial arrival, she was also noted to be tachycardic with heart rate of 120-140s, possibly sinus tachycardia although difficult to completely exclude ectopic atrial rhythm.  She was found not to be in atrial fibrillation at that time.  She also had a new left bundle branch block and troponin greater than 30 consistent with ACS.  Patient underwent emergent cardiac catheterization on 02/03/2018 which showed 25% proximal to mid RCA disease, 70% ostial ramus lesion, 50% ostial left circumflex lesion, 100% ostial to proximal LAD occlusion not amenable to PCI.   Procedure was complicated by cardiogenic shock with marked elevation of LV filling pressure and right heart pressure with pulmonary venous hypertension, intra-aortic balloon pump was placed.  Patient was seen and managed by advanced heart failure team.  She required norepinephrine and milrinone.  He also required Impella support as well.  CT surgery was consulted and patient underwent emergent three-vessel CABG by Dr. Roxy Manns on 02/03/2018 with LIMA to LAD, SVG to D1 and SVG to OM1.  TEE showed EF 20%, mild to moderate AR, mild to moderate MR.  There was some consideration for LVAD on 02/04/2018.  Impella left ventricular assist device was removed on 02/08/2018.  She was extubated on the same day.  Due to acute respiratory failure, she was treated with [name] and vancomycin.  He was also on digoxin.  Echocardiogram on 02/11/2018 showed EF 25-30%, severe hypokinesis of the basal anteroseptal, inferoseptal myocardium, grade 2 DD, severe aortic regurgitation however visually only appears to be moderate.  She did have some postop atrial fibrillation as well, amiodarone 200 mg twice daily was added.  [name] was started but later switched back to losartan due to elevated potassium.  Postop, patient was placed on aspirin and Plavix.  Patient was eventually discharged to skilled nursing facility, prior to discharge, she underwent a swallow study which suggested recurrent laryngeal nerve palsy.  Spironolactone was discontinued due to hyperkalemia.  Patient presents today for cardiology office visit.  She continued to have some chest soreness however has been recovering slowly.  I will obtain a limited echocardiogram to  follow-up on potential severe aortic regurgitation.  I am unable to uptitrate her heart failure symptom.  I do not think she can tolerate Entresto at this point.  Otherwise she appears to be euvolemic on physical exam with no lower extremity edema, orthopnea or PND.  I will continue her on amiodarone for now.  She  was not placed on systemic anticoagulation during the recent hospitalization.    Past Medical History:  Diagnosis Date  . Acute systolic congestive heart failure ([place]) 02/03/2018  . Allergy   . Anxiety   . Asthma   . Hypertension   . PAF (paroxysmal atrial fibrillation) ([place])   . Prophylactic measure 08/03/14-08/19/14   Prophyl. cranial radiation 24 Gy  . 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 Small cell lung cancer ([place]) 03/16/2014    Past Surgical History:  Procedure Laterality Date  . CESAREAN SECTION    . CORONARY ARTERY BYPASS GRAFT N/A 02/03/2018   Procedure: CORONARY ARTERY BYPASS GRAFTING (CABG);  Surgeon: Rexene Alberts, MD;  Location: [facility];  Service: Open Heart Surgery;  Laterality: N/A;  Time 3 using left internal mammary artery and endoscopically harvested right saphenous vein  . CORONARY BALLOON ANGIOPLASTY N/A 02/03/2018   Procedure: CORONARY BALLOON ANGIOPLASTY;  Surgeon: Martinique, Peter M, MD;  Location: [facility];  Service: Cardiovascular;  Laterality: N/A;  . CORONARY/GRAFT ACUTE MI REVASCULARIZATION N/A 02/03/2018   Procedure: Coronary/Graft Acute MI Revascularization;  Surgeon: Martinique, Peter M, MD;  Location: [place] [facility];  Service: Cardiovascular;  Laterality: N/A;  . IABP INSERTION N/A 02/03/2018   Procedure: IABP Insertion;  Surgeon: Martinique, Peter M, MD;  Location: [facility];  Service: Cardiovascular;  Laterality: N/A;  . INTRAOPERATIVE TRANSESOPHAGEAL ECHOCARDIOGRAM N/A 02/03/2018   Procedure: INTRAOPERATIVE TRANSESOPHAGEAL ECHOCARDIOGRAM;  Surgeon: Rexene Alberts, MD;  Location: [facility];  Service: Open Heart Surgery;  Laterality: N/A;  . LEFT HEART CATH AND CORONARY ANGIOGRAPHY N/A 02/03/2018   Procedure: LEFT HEART CATH AND CORONARY ANGIOGRAPHY;  Surgeon: Martinique, Peter M, MD;  Location: [facility];  Service:  Cardiovascular;  Laterality: N/A;  . MEDIASTINOSCOPY N/A 03/11/2014   Procedure: MEDIASTINOSCOPY;  Surgeon: Melrose Nakayama, MD;  Location: [facility];  Service: Thoracic;  Laterality: N/A;  . PLACEMENT OF [place] LEFT VENTRICULAR ASSIST DEVICE  02/03/2018   Procedure: PLACEMENT OF [place] LEFT VENTRICULAR ASSIST DEVICE LD;  Surgeon: Rexene Alberts, MD;  Location: [facility];  Service: Open Heart Surgery;;  . REMOVAL OF IMPELLA LEFT VENTRICULAR ASSIST DEVICE N/A 02/08/2018   Procedure: REMOVAL OF [place] LEFT VENTRICULAR ASSIST DEVICE;  Surgeon: Rexene Alberts, MD;  Location: [facility];  Service: Open Heart Surgery;  Laterality: N/A;  . RIGHT HEART CATH N/A 02/03/2018   Procedure: RIGHT HEART CATH;  Surgeon: Martinique, Peter M, MD;  Location: [facility];  Service: Cardiovascular;  Laterality: N/A;  . TEE WITHOUT CARDIOVERSION N/A 02/08/2018   Procedure: TRANSESOPHAGEAL ECHOCARDIOGRAM (TEE);  Surgeon: Rexene Alberts, MD;  Location: [facility];  Service: Open Heart Surgery;  Laterality: N/A;  . TUBAL LIGATION    . VIDEO BRONCHOSCOPY WITH ENDOBRONCHIAL ULTRASOUND N/A 03/11/2014   Procedure: VIDEO BRONCHOSCOPY WITH ENDOBRONCHIAL ULTRASOUND;  Surgeon: Melrose Nakayama, MD;  Location: [facility];  Service: Thoracic;  Laterality: N/A;    Current Medications: Outpatient Medications Prior to  Visit  Medication Sig Dispense Refill  . amiodarone (PACERONE) 200 MG tablet Take 1 tablet (200 mg total) by mouth daily.    Marland Kitchen aspirin EC 81 MG EC tablet Take 1 tablet (81 mg total) by mouth daily.    Marland Kitchen atorvastatin (LIPITOR) 80 MG tablet Take 1 tablet (80 mg total) by mouth daily at 6 PM.    . clopidogrel (PLAVIX) 75 MG tablet Take 1 tablet (75 mg total) by mouth daily.    . digoxin 62.5 MCG TABS Take 0.0625 mg by mouth daily.    . furosemide (LASIX) 40 MG tablet Take 1 tablet (40 mg total) by mouth daily. 30 tablet   . ipratropium-albuterol (DUONEB) 0.5-2.5 (3) MG/3ML SOLN Take 3 mLs by nebulization 2 (two) times daily. 360  mL   . ivabradine (CORLANOR) 5 MG TABS tablet Take 1 tablet (5 mg total) by mouth 2 (two) times daily with a meal. 60 tablet   . losartan (COZAAR) 25 MG tablet Take 1 tablet (25 mg total) by mouth daily.    Marland Kitchen oxyCODONE (OXY IR/ROXICODONE) 5 MG immediate release tablet Take 1-2 tablets (5-10 mg total) by mouth every 6 (six) hours as needed for severe pain. 30 tablet 0   Facility-Administered Medications Prior to Visit  Medication Dose Route Frequency Provider Last Rate Last Dose  . etomidate (AMIDATE) injection    Anesthesia Intra-op [name], CRNA   12 mg at 02/03/18 1130  . succinylcholine (ANECTINE) injection    Anesthesia Intra-op [name], CRNA   60 mg at 02/03/18 1130     Allergies:   Iodinated diagnostic agents; Latex; Pineapple; Sulfa antibiotics; and Codeine   Social History   Socioeconomic History  . Marital status: Married    Spouse name: Not on file  . Number of children: Not on file  . Years of education: Not on file  . Highest education level: Not on file  Occupational History  . Not on file  Social Needs  . Financial resource strain: Not on file  . Food insecurity:    Worry: Not on file    Inability: Not on file  . Transportation needs:    Medical: Not on file    Non-medical: Not on file  Tobacco Use  . Smoking status: Former Smoker    Packs/day: 1.00    Years: 20.00    Pack years: 20.00    Types: Cigarettes    Last attempt to quit: 03/13/2014    Years since quitting: 4.0  . Smokeless tobacco: Never Used  Substance and Sexual Activity  . Alcohol use: Yes    Alcohol/week: 4.8 oz    Types: 8 Glasses of wine per week  . Drug use: No  . Sexual activity: Never  Lifestyle  . Physical activity:    Days per week: Not on file    Minutes per session: Not on file  . Stress: Not on file  Relationships  . Social connections:    Talks on phone: Not on file    Gets together: Not on file    Attends religious service: Not on file    Active member  of club or organization: Not on file    Attends meetings of clubs or organizations: Not on file    Relationship status: Not on file  Other Topics Concern  . Not on file  Social History Narrative  . Not on file     Family History:  The patient's family history includes Cancer in her  maternal grandmother; Diabetes in her paternal grandmother; Heart attack in her mother; Heart disease in her mother; Hypertension in her maternal grandmother and mother.   ROS:   Please see the history of present illness.    ROS All other systems reviewed and are negative.   PHYSICAL EXAM:   VS:  BP (!) 102/58   Pulse 78   Ht 4' 11.5" (1.511 m)   Wt 102 lb 6.4 oz (46.4 kg)   BMI 20.34 kg/m    GEN: Well nourished, well developed, in no acute distress  HEENT: normal  Neck: no JVD, carotid bruits, or masses Cardiac: RRR; no murmurs, rubs, or gallops,no edema. Sternal scar well-healed. Respiratory:  clear to auscultation bilaterally, normal work of breathing GI: soft, nontender, nondistended, + BS MS: no deformity or atrophy  Skin: warm and dry, no rash Neuro:  Alert and Oriented x 3, Strength and sensation are intact Psych: euthymic mood, full affect  Wt Readings from Last 3 Encounters:  03/14/18 102 lb 6.4 oz (46.4 kg)  02/26/18 101 lb 9.6 oz (46.1 kg)  12/19/17 116 lb 12.8 oz (53 kg)      Studies/Labs Reviewed:   EKG:  EKG is ordered today.  The ekg ordered today demonstrates normal sinus rhythm, poor R wave progression anterior leads, otherwise no significant ST-T wave changes  Recent Labs: 02/12/2018: ALT 33 02/15/2018: B Natriuretic Peptide 2,526.7 02/18/2018: Magnesium 2.2 03/14/2018: BUN 13; Creatinine, Ser 1.03; Hemoglobin 9.1; Platelets 402; Potassium 3.8; Sodium 138   Lipid Panel    Component Value Date/Time   CHOL 279 (H) 01/01/2014 1111   TRIG 109 01/01/2014 1111   HDL 49 01/01/2014 1111   CHOLHDL 5.7 01/01/2014 1111   VLDL 22 01/01/2014 1111   LDLCALC 208 (H) 01/01/2014 1111     Additional studies/ records that were reviewed today include:   Cath 02/03/2018 Conclusion     Prox RCA to Mid RCA lesion is 25% stenosed.  Ost Ramus lesion is 70% stenosed.  Ost Cx to Prox Cx lesion is 50% stenosed.  Ost LAD to Prox LAD lesion is 100% stenosed.  LV end diastolic pressure is severely elevated.  Hemodynamic findings consistent with severe pulmonary hypertension.  Post intervention, there is a 100% residual stenosis.   1. 2 vessel obstructive CAD. There was ostial LAD occlusion. With coronary guidewire probing there was shift of plaque into the LCx and ramus intermediate with acute vessel compromise and resultant worsening cardiogenic shock. The patency of LCx and ramus intermediate vessels were maintained with PTCA and wire placement. 2. Cardiogenic shock with marked elevation of LV filling pressures and right heart pressures with pulmonary venous HTN. Please refer to note below. 3. S/p IABP placement.   Plan: transfer to OR as noted for emergent CABG. Continue pressor support and IABP. Continue ventilator support.     CABG x 3 02/03/2018 EMERGENT CORONARY BYPASS GRAFTING x 3 -Left Internal Mammary Artery to Left Anterior Descending -Saphenous Vein Graft to First Diagonal -Saphenous Vein Graft to First Obtuse Marginal Brach     ASSESSMENT:    1. Coronary artery disease involving coronary bypass graft of native heart without angina pectoris   2. Severe aortic regurgitation   3. LV dysfunction   4. Medication management   5. PAF (paroxysmal atrial fibrillation) ([place])   6. Essential hypertension      PLAN:  In order of problems listed above:  1. CAD s/p CABG: Sternal scar is well-healed.  She continues to have some soreness.  Will increase exercise at this time.  Recommend cardiac rehab once she is seen by cardiothoracic surgery.  2. Severe aortic regurgitation: Seen on last echocardiogram.  However the severe aortic regurgitation was felt to be  related to heart failure.  She has very mild heart murmur on physical exam.  I think by this point her aortic regurgitation likely has improved.  Will obtain outpatient limited echocardiogram to look at the aortic valve  3. LV dysfunction: On digoxin, Ivabradine and losartan.  Consider addition of beta-blocker once blood pressure improved.  4. PAF: Patient was noted to have atrial fibrillation in 2015 on EKG, has not had a ready recurrence until the recent bypass surgery.  We will hold off on initiating systemic anticoagulation at this time.  5. Hypertension: On losartan 25 mg daily.    Medication Adjustments/Labs and Tests Ordered: Current medicines are reviewed at length with the patient today.  Concerns regarding medicines are outlined above.  Medication changes, Labs and Tests ordered today are listed in the Patient Instructions below. Patient Instructions  Medication Instructions: Almyra Deforest, PA-c recommends that you continue on your current medications as directed. Please refer to the Current Medication list given to you today.  Labwork: Your physician recommends that you return for lab work TODAY.  Testing/Procedures: 1. Limited Echocardiogram after 03/20/2018 - Your physician has requested that you have an echocardiogram. Echocardiography is a painless test that uses sound waves to create images of your heart. It provides your doctor with information about the size and shape of your heart and how well your heart's chambers and valves are working. This procedure takes approximately one hour. There are no restrictions for this procedure. >>This will be performed at our [facility] location [place], [place] [address] [phone]  Follow-up: Please keep your appointment in the heart failure clinic on 03/20/18  If you need a refill on your cardiac medications before your next appointment, please call your pharmacy.    Hilbert Corrigan, [place]  03/15/2018 4:26 PM      [facility] [place], [place], [place]  [address] Phone: [phone]; Fax: [phone]

## 2018-03-15 ENCOUNTER — Encounter: Payer: Self-pay | Admitting: Physician Assistant

## 2018-03-15 LAB — BASIC METABOLIC PANEL
BUN / CREAT RATIO: 13 (ref 9–23)
BUN: 13 mg/dL (ref 6–24)
CALCIUM: 9.5 mg/dL (ref 8.7–10.2)
CO2: 23 mmol/L (ref 20–29)
Chloride: 99 mmol/L (ref 96–106)
Creatinine, Ser: 1.03 mg/dL — ABNORMAL HIGH (ref 0.57–1.00)
GFR calc non Af Amer: 60 mL/min/{1.73_m2} (ref >59)
GFR, EST AFRICAN AMERICAN: 70 mL/min/{1.73_m2} (ref >59)
Glucose: 134 mg/dL — ABNORMAL HIGH (ref 65–99)
Potassium: 3.8 mmol/L (ref 3.5–5.2)
Sodium: 138 mmol/L (ref 134–144)

## 2018-03-15 LAB — CBC
HEMATOCRIT: 28.7 % — AB (ref 34.0–46.6)
HEMOGLOBIN: 9.1 g/dL — AB (ref 11.1–15.9)
MCH: 28.6 pg (ref 26.6–33.0)
MCHC: 31.7 g/dL (ref 31.5–35.7)
MCV: 90 fL (ref 79–97)
Platelets: 402 10*3/uL — ABNORMAL HIGH (ref 150–379)
RBC: 3.18 x10E6/uL — ABNORMAL LOW (ref 3.77–5.28)
RDW: 16.9 % — AB (ref 12.3–15.4)
WBC: 9 10*3/uL (ref 3.4–10.8)

## 2018-03-15 LAB — DIGOXIN LEVEL: DIGOXIN, SERUM: 0.7 ng/mL (ref 0.5–0.9)

## 2018-03-19 ENCOUNTER — Other Ambulatory Visit: Payer: Self-pay | Admitting: Thoracic Surgery (Cardiothoracic Vascular Surgery)

## 2018-03-19 ENCOUNTER — Inpatient Hospital Stay: Admission: RE | Admit: 2018-03-19 | Payer: BLUE CROSS/BLUE SHIELD | Source: Ambulatory Visit

## 2018-03-19 DIAGNOSIS — C3490 Malignant neoplasm of unspecified part of unspecified bronchus or lung: Secondary | ICD-10-CM

## 2018-03-20 ENCOUNTER — Ambulatory Visit (HOSPITAL_COMMUNITY)
Admission: RE | Admit: 2018-03-20 | Discharge: 2018-03-20 | Disposition: A | Discharge status: Home / Self Care | Payer: BLUE CROSS/BLUE SHIELD | Source: Ambulatory Visit | Attending: Internal Medicine | Admitting: Internal Medicine

## 2018-03-20 ENCOUNTER — Encounter (HOSPITAL_COMMUNITY): Payer: Self-pay

## 2018-03-20 ENCOUNTER — Inpatient Hospital Stay (HOSPITAL_COMMUNITY): Admission: RE | Admit: 2018-03-20 | Payer: BLUE CROSS/BLUE SHIELD | Source: Ambulatory Visit

## 2018-03-20 VITALS — BP 122/74 | HR 93 | Ht 59.5 in | Wt 103.2 lb

## 2018-03-20 DIAGNOSIS — Z9221 Personal history of antineoplastic chemotherapy: Secondary | ICD-10-CM | POA: Diagnosis not present

## 2018-03-20 DIAGNOSIS — I252 Old myocardial infarction: Secondary | ICD-10-CM | POA: Diagnosis not present

## 2018-03-20 DIAGNOSIS — Z7902 Long term (current) use of antithrombotics/antiplatelets: Secondary | ICD-10-CM | POA: Insufficient documentation

## 2018-03-20 DIAGNOSIS — I25810 Atherosclerosis of coronary artery bypass graft(s) without angina pectoris: Secondary | ICD-10-CM

## 2018-03-20 DIAGNOSIS — Z7982 Long term (current) use of aspirin: Secondary | ICD-10-CM | POA: Diagnosis not present

## 2018-03-20 DIAGNOSIS — D508 Other iron deficiency anemias: Secondary | ICD-10-CM | POA: Diagnosis not present

## 2018-03-20 DIAGNOSIS — Z8249 Family history of ischemic heart disease and other diseases of the circulatory system: Secondary | ICD-10-CM | POA: Insufficient documentation

## 2018-03-20 DIAGNOSIS — Z79899 Other long term (current) drug therapy: Secondary | ICD-10-CM | POA: Diagnosis not present

## 2018-03-20 DIAGNOSIS — I2510 Atherosclerotic heart disease of native coronary artery without angina pectoris: Secondary | ICD-10-CM | POA: Insufficient documentation

## 2018-03-20 DIAGNOSIS — C3490 Malignant neoplasm of unspecified part of unspecified bronchus or lung: Secondary | ICD-10-CM

## 2018-03-20 DIAGNOSIS — I351 Nonrheumatic aortic (valve) insufficiency: Secondary | ICD-10-CM | POA: Insufficient documentation

## 2018-03-20 DIAGNOSIS — D649 Anemia, unspecified: Secondary | ICD-10-CM | POA: Insufficient documentation

## 2018-03-20 DIAGNOSIS — Z85118 Personal history of other malignant neoplasm of bronchus and lung: Secondary | ICD-10-CM | POA: Diagnosis not present

## 2018-03-20 DIAGNOSIS — I5022 Chronic systolic (congestive) heart failure: Secondary | ICD-10-CM | POA: Diagnosis not present

## 2018-03-20 DIAGNOSIS — Z87891 Personal history of nicotine dependence: Secondary | ICD-10-CM | POA: Insufficient documentation

## 2018-03-20 DIAGNOSIS — I110 Hypertensive heart disease with heart failure: Secondary | ICD-10-CM | POA: Insufficient documentation

## 2018-03-20 DIAGNOSIS — Z923 Personal history of irradiation: Secondary | ICD-10-CM | POA: Diagnosis not present

## 2018-03-20 DIAGNOSIS — Z951 Presence of aortocoronary bypass graft: Secondary | ICD-10-CM | POA: Insufficient documentation

## 2018-03-20 DIAGNOSIS — I480 Paroxysmal atrial fibrillation: Secondary | ICD-10-CM | POA: Diagnosis not present

## 2018-03-20 MED ORDER — DOCUSATE SODIUM 100 MG PO CAPS: 100.0000 mg | ORAL_CAPSULE | Freq: Two times a day (BID) | ORAL | 3 refills | Status: DC | PRN

## 2018-03-20 MED ORDER — SPIRONOLACTONE 25 MG PO TABS: 12.5000 mg | ORAL_TABLET | Freq: Every day | ORAL | 3 refills | Status: DC

## 2018-03-20 NOTE — Progress Notes (Signed)
PCP: Cardiologist: Dr Marlou Porch  Primary HF Cardiologist: Dr [name] CT Surgeon: Dr Roxy Manns Oncologist: Dr [name]   HPI: Brittany [name] a 57 y/o woman with a history of PAF,preveious smoker quit 4 years ago, hypertension, 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   Admitted 02/03/2018 with NSTEMI and shock. Underwent emergent cath 02/03/18 showed LAD 100% stenosed, LCx 95% stenosed. Taken for emergent CABG 02/03/18. Required impella post op. Hospital course complicated by cardiogenic shock, HCAP, A fib, respiratory failure, and swallowing issues. She was discharged to SNF. Discharge weight 103 pounds.   Today she returns for post hospital follow up. Overall feeling ok. Mild dyspnea with exertion. Now on room air except at bed time. Denies PND/Orthopnea. No chest pain.  Appetite ok. No fever or chills. Weight at home 102-103 pounds. pounds. Taking all medications provided at [facility]. She continues to work with PT/OT every day. Eventually she would like to return home.   ECHO 02/11/2018  - Left ventricle: The cavity size was normal. There was mild   concentric hypertrophy. Systolic function was severely reduced.   The estimated ejection fraction was in the range of 25% to 30%.   There is akinesis of the mid-apical anterior, lateral,   inferolateral, inferior, apical septal and apical myocardium.   There is severe hypokinesis of the basalanteroseptal and   inferoseptal myocardium. Features are consistent with a   pseudonormal left ventricular filling pattern, with concomitant   abnormal relaxation and increased filling pressure (grade 2   diastolic dysfunction). Doppler parameters are consistent with   high ventricular filling pressure. - Aortic valve: There was severe regurgitation by pressure halftime   of 133 msec but visually only appears moderate. - Mitral valve:  Calcified annulus. There was trivial regurgitation.   LHC 02/03/2018    Prox RCA to Mid RCA lesion is 25% stenosed.  Ost Ramus lesion is 70% stenosed.  Ost Cx to Prox Cx lesion is 50% stenosed.  Ost LAD to Prox LAD lesion is 100% stenosed.  LV end diastolic pressure is severely elevated.  Hemodynamic findings consistent with severe pulmonary hypertension.  Post intervention, there is a 100% residual stenosis.  ROS: All systems negative except as listed in HPI, PMH and Problem List.  SH:  Social History   Socioeconomic History  . Marital status: Married    Spouse name: Not on file  . Number of children: Not on file  . Years of education: Not on file  . Highest education level: Not on file  Occupational History  . Not on file  Social Needs  . Financial resource strain: Not on file  . Food insecurity:    Worry: Not on file    Inability: Not on file  . Transportation needs:    Medical: Not on file    Non-medical: Not on file  Tobacco Use  . Smoking status: Former Smoker    Packs/day: 1.00    Years: 20.00    Pack years: 20.00    Types: Cigarettes    Last attempt to quit: 03/13/2014    Years since quitting: 4.0  . Smokeless tobacco: Never Used  Substance and Sexual Activity  . Alcohol use: Yes    Alcohol/week: 4.8 oz    Types: 8 Glasses of wine per week  . Drug use: No  . Sexual activity: Never  Lifestyle  . Physical activity:    Days per week: Not on file  Minutes per session: Not on file  . Stress: Not on file  Relationships  . Social connections:    Talks on phone: Not on file    Gets together: Not on file    Attends religious service: Not on file    Active member of club or organization: Not on file    Attends meetings of clubs or organizations: Not on file    Relationship status: Not on file  . Intimate partner violence:    Fear of current or ex partner: Not on file    Emotionally abused: Not on file    Physically abused: Not on file    Forced  sexual activity: Not on file  Other Topics Concern  . Not on file  Social History Narrative  . Not on file    FH:  Family History  Problem Relation Age of Onset  . Heart disease Mother   . Hypertension Mother   . Heart attack Mother   . Hypertension Maternal Grandmother   . Cancer Maternal Grandmother   . Diabetes Paternal Grandmother   . Stroke Neg Hx     Past Medical History:  Diagnosis Date  . Acute systolic congestive heart failure ([place]) 02/03/2018  . Allergy   . Anxiety   . Asthma   . Hypertension   . PAF (paroxysmal atrial fibrillation) ([place])   . Prophylactic measure 08/03/14-08/19/14   Prophyl. cranial radiation 24 Gy  . 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 Small cell lung cancer ([place]) 03/16/2014    Current Outpatient Medications  Medication Sig Dispense Refill  . amiodarone (PACERONE) 200 MG tablet Take 1 tablet (200 mg total) by mouth daily.    Marland Kitchen aspirin EC 81 MG EC tablet Take 1 tablet (81 mg total) by mouth daily.    Marland Kitchen atorvastatin (LIPITOR) 80 MG tablet Take 1 tablet (80 mg total) by mouth daily at 6 PM.    . clopidogrel (PLAVIX) 75 MG tablet Take 1 tablet (75 mg total) by mouth daily.    . digoxin 62.5 MCG TABS Take 0.0625 mg by mouth daily.    . furosemide (LASIX) 40 MG tablet Take 1 tablet (40 mg total) by mouth daily. 30 tablet   . ivabradine (CORLANOR) 5 MG TABS tablet Take 1 tablet (5 mg total) by mouth 2 (two) times daily with a meal. 60 tablet   . losartan (COZAAR) 25 MG tablet Take 1 tablet (25 mg total) by mouth daily.    Marland Kitchen oxyCODONE (OXY IR/ROXICODONE) 5 MG immediate release tablet Take 1-2 tablets (5-10 mg total) by mouth every 6 (six) hours as needed for severe pain. 30 tablet 0  . ipratropium-albuterol (DUONEB) 0.5-2.5 (3) MG/3ML SOLN Take 3 mLs by nebulization 2 (two) times daily. 360 mL    No current facility-administered  medications for this encounter.    Facility-Administered Medications Ordered in Other Encounters  Medication Dose Route Frequency Provider Last Rate Last Dose  . etomidate (AMIDATE) injection    Anesthesia Intra-op [name], CRNA   12 mg at 02/03/18 1130  . succinylcholine (ANECTINE) injection    Anesthesia Intra-op [name], CRNA   60 mg at 02/03/18 1130    Vitals:   03/20/18 1118  BP: 122/74  Pulse: 93  SpO2: 98%  Weight: 103 lb 3.2 oz (46.8 kg)  Height: 4' 11.5" (  1.511 m)    PHYSICAL EXAM: General:  Thin. Ambulated in the clinic with a walker. No resp difficulty HEENT: normal Neck: supple. JVP flat. Carotids 2+ bilaterally; no bruits. No lymphadenopathy or thryomegaly appreciated. Cor: PMI normal. Regular rate & rhythm. No rubs, gallops or murmurs. Sternal scar approximated. No exudate or erythema Lungs: Coarse throughout on room air.  Abdomen: soft, nontender, nondistended. No hepatosplenomegaly. No bruits or masses. Good bowel sounds. Extremities: no cyanosis, clubbing, rash, edema Neuro: alert & orientedx3, cranial nerves grossly intact. Moves all 4 extremities w/o difficulty. Affect pleasant.   ECG: NSR 91 bpm    ASSESSMENT & PLAN: 1. 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  Cath with severe 2v CAD as above with severe LV dysfunction/ICM. s/p Emergent CABG 02/03/18.   - No S/s ischemia     - On aspirin, plavix and atorvastatin.  2. Chronic Systolic HF  H/O cardiogenic shock in setting of #1: TEE 02/03/18 LVEF 20%, Mild to Mod AR, Mild/Mod MR, RV normal. Impella out 3/1.  Echo 3/4 EF 25-30% with moderate-severe aortic insufficiency which is likely contributing to CHF.  - No BB with recent low output.  - Volume status stable on lasix 40 mg daily.  - Continue digoxin 0.125 mg daily - Add 12.5 mg spiro daily. Check BMET next week.    - Continue ivabradine 5 mg BID. - Continue losartan 25 mg daily.  - Plan on repeat ECHO in a few  months.  3. PAF:  CHA2DS2/VASc is at least 4. (CHF, Vasc disease, HTN, Female).   - She has history of Afib RVR in the past in the setting of her Lung CA. Previously on Xarelto but stopped her medications.  - Maintaining NSR. I am going to stop amiodarone. Hopefully she will maintain NSR especially with concern for recurrent lung cancer.   - Will continue ASA and Plavix per discussion with Dr. [name] and Dr. Roxy Manns. No anticoagulation with h/o lung caner.  4. 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  - Chest CT 02/19/18 with mass-like consolidation in R hilum concerning for recurrent tumor. She has repeat CT set up for 04/01/18.  - She will then follow up with Dr Julien Nordmann.  5. Anemia:  - stable 9.1 on 03/14/2018  6. Aortic insufficiency: Moderate-severe on postop echo.  Likely potentiating CHF.   - Cotninue ARB. No change.  7. Dysphagia - Swallow study with concerns for RLN palsy during recent hospitaliazation. This has resolved.    Of note she cancelled appointmentst with Dr Roxy Manns and Dr Julien Nordmann. These have been rescheduled.   Follow up 4 weeks with Dr [name].   Thorin Starner  NP-C  12:23 PM

## 2018-03-20 NOTE — Patient Instructions (Signed)
STOP taking Amiodarone  START taking Spironolactone 12.5 mg (0.5 Tablet) Once Daily  Labs in 1 week (bmet)  Follow up as scheduled in 4 weeks.

## 2018-03-25 ENCOUNTER — Ambulatory Visit: Payer: BLUE CROSS/BLUE SHIELD | Admitting: Thoracic Surgery (Cardiothoracic Vascular Surgery)

## 2018-03-26 ENCOUNTER — Telehealth: Payer: Self-pay

## 2018-03-27 ENCOUNTER — Ambulatory Visit
Admission: RE | Admit: 2018-03-27 | Discharge: 2018-03-27 | Disposition: A | Discharge status: Home / Self Care | Payer: BLUE CROSS/BLUE SHIELD | Source: Ambulatory Visit | Attending: Thoracic Surgery (Cardiothoracic Vascular Surgery) | Admitting: Thoracic Surgery (Cardiothoracic Vascular Surgery)

## 2018-03-27 DIAGNOSIS — C3490 Malignant neoplasm of unspecified part of unspecified bronchus or lung: Secondary | ICD-10-CM

## 2018-03-27 MED ORDER — IOPAMIDOL (ISOVUE-300) INJECTION 61%
75.0000 mL | Freq: Once | INTRAVENOUS | Status: AC | PRN
  Administered 2018-03-27: 75 mL via INTRAVENOUS

## 2018-03-29 ENCOUNTER — Other Ambulatory Visit: Payer: Self-pay

## 2018-03-29 ENCOUNTER — Ambulatory Visit (HOSPITAL_COMMUNITY): Payer: BLUE CROSS/BLUE SHIELD | Attending: Cardiovascular Disease

## 2018-03-29 DIAGNOSIS — F419 Anxiety disorder, unspecified: Secondary | ICD-10-CM | POA: Insufficient documentation

## 2018-03-29 DIAGNOSIS — Z951 Presence of aortocoronary bypass graft: Secondary | ICD-10-CM | POA: Insufficient documentation

## 2018-03-29 DIAGNOSIS — I110 Hypertensive heart disease with heart failure: Secondary | ICD-10-CM | POA: Diagnosis not present

## 2018-03-29 DIAGNOSIS — I480 Paroxysmal atrial fibrillation: Secondary | ICD-10-CM | POA: Diagnosis not present

## 2018-03-29 DIAGNOSIS — I2510 Atherosclerotic heart disease of native coronary artery without angina pectoris: Secondary | ICD-10-CM | POA: Insufficient documentation

## 2018-03-29 DIAGNOSIS — I5021 Acute systolic (congestive) heart failure: Secondary | ICD-10-CM | POA: Diagnosis not present

## 2018-03-29 DIAGNOSIS — I083 Combined rheumatic disorders of mitral, aortic and tricuspid valves: Secondary | ICD-10-CM | POA: Diagnosis not present

## 2018-03-29 DIAGNOSIS — J45909 Unspecified asthma, uncomplicated: Secondary | ICD-10-CM | POA: Diagnosis not present

## 2018-03-29 DIAGNOSIS — I351 Nonrheumatic aortic (valve) insufficiency: Secondary | ICD-10-CM

## 2018-03-29 DIAGNOSIS — I519 Heart disease, unspecified: Secondary | ICD-10-CM | POA: Diagnosis not present

## 2018-04-01 ENCOUNTER — Inpatient Hospital Stay: Admission: RE | Admit: 2018-04-01 | Payer: BLUE CROSS/BLUE SHIELD | Source: Ambulatory Visit

## 2018-04-03 ENCOUNTER — Other Ambulatory Visit: Payer: Self-pay | Admitting: Thoracic Surgery (Cardiothoracic Vascular Surgery)

## 2018-04-03 DIAGNOSIS — Z951 Presence of aortocoronary bypass graft: Secondary | ICD-10-CM

## 2018-04-03 NOTE — Progress Notes (Signed)
Pumping function still low, but the leakage in the aortic valve is not as severe as it was in the previous echo.

## 2018-04-08 ENCOUNTER — Ambulatory Visit
Admission: RE | Admit: 2018-04-08 | Discharge: 2018-04-08 | Disposition: A | Discharge status: Home / Self Care | Payer: BLUE CROSS/BLUE SHIELD | Source: Ambulatory Visit | Attending: Thoracic Surgery (Cardiothoracic Vascular Surgery) | Admitting: Thoracic Surgery (Cardiothoracic Vascular Surgery)

## 2018-04-08 ENCOUNTER — Ambulatory Visit (INDEPENDENT_AMBULATORY_CARE_PROVIDER_SITE_OTHER): Payer: Self-pay | Admitting: Surgical

## 2018-04-08 DIAGNOSIS — Z951 Presence of aortocoronary bypass graft: Secondary | ICD-10-CM

## 2018-04-08 NOTE — Progress Notes (Signed)
[address]       [place] [address]             [phone]      Brittany [name] [place] Medical Record #[id number] Date of Birth: 1961/05/14  Referring: [place], Peter M, MD Primary Care: System, Pcp Not In Primary Cardiologist: No primary care provider on file.   Chief Complaint:   POST OP FOLLOW UP  Date of Procedure:    02/03/2018  Preoperative Diagnosis:        Severe Multi-vessel Coronary Artery Disease  Acute ST-segment Elevation Myocardial Infarction  Cardiogenic Shock  Postoperative Diagnosis:    Same  Procedure:        Emergency Coronary Artery Bypass Grafting x 3              Left Internal Mammary Artery to Distal Left Anterior Descending Coronary Artery             Saphenous Vein Graft to Obtuse Marginal Branch of Left Circumflex Coronary Artery             Sapheonous Vein Graft to Diagonal Branch Coronary Artery             Endoscopic Vein Harvest from Right Thigh   Implantation of Impella LD Left Ventricular Assist Device             10 mm Hemashield graft with direct aortic approach  Surgeon:        Brittany Gu. Roxy Manns, MD  Assistant:       Brittany Handler, PA-C  Anesthesia:    Brittany Barthel, MD  Operative Findings: ? Pre-operative cardiogenic shock ? Profound left ventricular systolic dysfunction ? Moderate aortic insufficiency ? Good quality left internal mammary artery conduit ? Good quality saphenous vein conduit ? Good quality target vessels for grafting    History of Present Illness:    Patient is a 57 year old female status post the above described procedure.  At time of discharge she was sent to a skilled nursing facility for further rehabilitation.  She is seen in the office on today's date and routine postsurgical follow-up.  She has been seen by cardiology who is managing from a cardiology/heart failure perspective.  There are plans to repeat the echocardiogram in a few months.  A CT scan was  obtained on 03/27/2018 and reviewed by Dr. Roxy [name].  She will need close monitoring at the cancer center.  She reports that she continues to stay at the skilled facility although she is unsure how much longer her insurance will cover this.  She has improved from a physical effective but still has shortness of breath with low level activity and does use oxygen at night.  She has had no recent fevers, chills or other constitutional symptoms.  She denies chest pain.  She had no difficulties with her incisions healing.      Past Medical History:  Diagnosis Date  . Acute systolic congestive heart failure ([place]) 02/03/2018  . Allergy   . Anxiety   . Asthma   . Hypertension   . PAF (paroxysmal atrial fibrillation) ([place])   . Prophylactic measure 08/03/14-08/19/14   Prophyl. cranial radiation 24 Gy  . 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 Small cell lung cancer ([place]) 03/16/2014     Social History   Tobacco Use  Smoking Status Former Smoker  . Packs/day: 1.00  . Years: 20.00  . Pack years: 20.00  . Types: Cigarettes  . Last attempt to quit: 03/13/2014  . Years since quitting: 4.0  Smokeless Tobacco Never Used    Social History   Substance and Sexual Activity  Alcohol Use Yes  . Alcohol/week: 4.8 oz  . Types: 8 Glasses of wine per week     Allergies  Allergen Reactions  . Iodinated Diagnostic Agents Hives and Rash    Needs 13-hour prep before CT scans w/ contrast.  Can do Benadryl 50mg  PO for ESI's, etc  . Latex Hives and Other (See Comments)    Skin irritation  . Pineapple Hives  . Sulfa Antibiotics Hives and Rash  . Codeine Nausea And Vomiting    Current Outpatient Medications  Medication Sig Dispense Refill  . aspirin EC 81 MG EC tablet Take 1 tablet (81 mg total) by mouth daily.    Marland Kitchen atorvastatin (LIPITOR) 80 MG tablet Take 1 tablet (80 mg total) by mouth daily  at 6 PM.    . clopidogrel (PLAVIX) 75 MG tablet Take 1 tablet (75 mg total) by mouth daily.    . digoxin 62.5 MCG TABS Take 0.0625 mg by mouth daily.    Marland Kitchen docusate sodium (COLACE) 100 MG capsule Take 1 capsule (100 mg total) by mouth 2 (two) times daily as needed for mild constipation. 60 capsule 3  . furosemide (LASIX) 40 MG tablet Take 1 tablet (40 mg total) by mouth daily. 30 tablet   . ipratropium-albuterol (DUONEB) 0.5-2.5 (3) MG/3ML SOLN Take 3 mLs by nebulization 2 (two) times daily. 360 mL   . ivabradine (CORLANOR) 5 MG TABS tablet Take 1 tablet (5 mg total) by mouth 2 (two) times daily with a meal. 60 tablet   . losartan (COZAAR) 25 MG tablet Take 1 tablet (25 mg total) by mouth daily.    Marland Kitchen oxyCODONE (OXY IR/ROXICODONE) 5 MG immediate release tablet Take 1-2 tablets (5-10 mg total) by mouth every 6 (six) hours as needed for severe pain. 30 tablet 0  . spironolactone (ALDACTONE) 25 MG tablet Take 0.5 tablets (12.5 mg total) by mouth daily. 45 tablet 3   No current facility-administered medications for this visit.    Facility-Administered Medications Ordered in Other Visits  Medication Dose Route Frequency Provider Last Rate Last Dose  . etomidate (AMIDATE) injection    Anesthesia Intra-op Brittany Bright, CRNA   12 mg at 02/03/18 1130  . succinylcholine (ANECTINE) injection    Anesthesia Intra-op Brittany Bright, CRNA   60 mg at 02/03/18 1130       Physical Exam: BP (!) 90/56   Pulse 90   Resp 20   Ht 4' 11.5" (1.511 [name])   Wt 45.8 kg (101 lb)   SpO2 98% Comment: RA  BMI 20.06 kg/[name]   General appearance: alert, cooperative and no distress Heart: regular rate and rhythm Lungs: Slightly coarse throughout Abdomen: soft, non-tender; bowel sounds normal; no masses,  no organomegaly Extremities: No edema Wound: Incisions healing well without evidence of infection.   Diagnostic Studies & Laboratory data:     Recent Radiology Findings:   Dg Chest 2 View  Result Date:  04/08/2018 CLINICAL DATA:  Status post coronary artery bypass graft. EXAM: CHEST - 2 VIEW COMPARISON:  Radiographs of February 20, 2018. FINDINGS: Stable cardiomegaly. Status post coronary bypass graft. No pneumothorax is noted. Left lung is clear. Minimal right basilar subsegmental atelectasis or scarring  is noted with minimal pleural effusion. Bony thorax is unremarkable. IMPRESSION: Minimal right basilar subsegmental atelectasis or scarring with minimal right pleural effusion. Electronically Signed   By: Marijo Conception, [name].D.   On: 04/08/2018 14:47     Ct Chest W Contrast  Result Date: 03/27/2018 CLINICAL DATA:  Small cell right lung cancer diagnosed 2015 status post radiation therapy in April 2015 and chemotherapy. Right perihilar masslike consolidation on recent noncontrast CT, presenting for follow-up. EXAM: CT CHEST WITH CONTRAST TECHNIQUE: Multidetector CT imaging of the chest was performed during intravenous contrast administration. CONTRAST:  27mL ISOVUE-300 IOPAMIDOL (ISOVUE-300) INJECTION 61% COMPARISON:  02/19/2018 chest CT. FINDINGS: Cardiovascular: Stable mild cardiomegaly. No significant pericardial fluid/thickening. Left anterior descending and left circumflex coronary atherosclerosis status post CABG. Atherosclerotic nonaneurysmal thoracic aorta. Normal caliber pulmonary arteries. No central pulmonary emboli. Mediastinum/Nodes: Hypodense subcentimeter right thyroid lobe nodule, probably stable, better delineated on today's scan. Unremarkable esophagus. No pathologically enlarged axillary, mediastinal or hilar lymph nodes. Lungs/Pleura: No pneumothorax. Small to moderate dependent right pleural effusion is increased. No left pleural effusion. Moderate centrilobular emphysema. Mild compressive dependent right lower lobe atelectasis. New complete right middle lobe atelectasis. Masslike sharply marginated right perihilar consolidation with associated mild bronchiectasis measures 7.1 x 3.4 cm (series  5/image 68), decreased from 8.9 x 4.4 cm on 02/19/2018 chest CT, associated with volume loss and distortion. Patchy ground-glass opacities in both lungs are decreased in the interval. No significant pulmonary nodules. Upper abdomen: No acute abnormality. Musculoskeletal: No aggressive appearing focal osseous lesions. Intact median sternotomy wires. IMPRESSION: 1. No definite findings of locally recurrent tumor in the right lung. 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 Continued chest CT surveillance is advised in 3-6 months. 2. No thoracic adenopathy or other findings of metastatic disease in the chest. 3. Small to moderate dependent right pleural effusion, increased. 4. Stable mild cardiomegaly. 5. Mild patchy ground-glass opacities in both lungs are decreased in the interval, favor improved mild residual pulmonary edema. 6. New complete right middle lobe atelectasis. Aortic Atherosclerosis (ICD10-I70.0) and Emphysema (ICD10-J43.9). Electronically Signed   By: Ilona Sorrel [name].D.   On: 03/27/2018 16:05     Recent Lab Findings: Lab Results  Component Value Date   WBC 9.0 03/14/2018   HGB 9.1 (L) 03/14/2018   HCT 28.7 (L) 03/14/2018   PLT 402 (H) 03/14/2018   GLUCOSE 134 (H) 03/14/2018   CHOL 279 (H) 01/01/2014   TRIG 109 01/01/2014   HDL 49 01/01/2014   LDLCALC 208 (H) 01/01/2014   ALT 33 02/12/2018   AST 56 (H) 02/12/2018   NA 138 03/14/2018   K 3.8 03/14/2018   CL 99 03/14/2018   CREATININE 1.03 (H) 03/14/2018   BUN 13 03/14/2018   CO2 23 03/14/2018   TSH 0.624 03/11/2014   INR 1.28 02/08/2018   HGBA1C 5.6 02/21/2018      Assessment / Plan: Patient is overall progressing slowly but with consistent improvement over time.  She is encouraged to continue her physical therapy/rehab.  We will assist her in arranging a follow-up appointment with Dr. [name] at the cancer center.  Long-term management of her  congestive failure will be ongoing through the advanced heart failure clinic.  We will see her again in 1 year or prior to that for any surgically related issues or as requested.      John Giovanni, PA-C 04/08/2018 2:55 PM

## 2018-04-08 NOTE — Assessment & Plan Note (Signed)
Slow but steady progress

## 2018-04-08 NOTE — Patient Instructions (Signed)
Continue rehab

## 2018-04-09 ENCOUNTER — Telehealth: Payer: Self-pay | Admitting: Internal Medicine

## 2018-04-09 ENCOUNTER — Telehealth (HOSPITAL_COMMUNITY): Payer: Self-pay | Admitting: *Deleted

## 2018-04-09 NOTE — Telephone Encounter (Signed)
Scheduled appt per 4/29 sch message - unable to reach pt and vmail not set up . Sent reminder letter in the mail with appt date and time.

## 2018-04-09 NOTE — Telephone Encounter (Signed)
Pt called stating she is being d/c'd from rehab facility today or tomorrow and she is going to apply for long term disability.  She stats she needs our records and a letter stating we recommend long term disability.  Advised Dr [name] is out of office today, will call her once letter is complete.  She also states she doesn't know how she is going home, she feels like she is not prepared, she has no walker or oxygen set up and states she still needs assist with certain activities.  Asked about facility arranged [place] or equipment and she states she is unsure, advised her she needs to bring this up before discharge as they facility should be able to arrange all of this before she is sent home.  She is agreeable, if she is sent home without anything I have advised her to call us back and we can assist getting things arranged for her.

## 2018-04-10 NOTE — Telephone Encounter (Signed)
Pt came by office and got her records today, she did signe ROR

## 2018-04-11 DIAGNOSIS — Z951 Presence of aortocoronary bypass graft: Secondary | ICD-10-CM

## 2018-04-11 DIAGNOSIS — Z48812 Encounter for surgical aftercare following surgery on the circulatory system: Secondary | ICD-10-CM

## 2018-04-12 ENCOUNTER — Other Ambulatory Visit (HOSPITAL_COMMUNITY): Payer: Self-pay | Admitting: Student

## 2018-04-19 ENCOUNTER — Inpatient Hospital Stay (HOSPITAL_COMMUNITY): Payer: BLUE CROSS/BLUE SHIELD

## 2018-04-19 ENCOUNTER — Encounter (HOSPITAL_COMMUNITY): Payer: Self-pay

## 2018-04-19 ENCOUNTER — Ambulatory Visit (HOSPITAL_BASED_OUTPATIENT_CLINIC_OR_DEPARTMENT_OTHER)
Admission: RE | Admit: 2018-04-19 | Discharge: 2018-04-19 | Disposition: A | Discharge status: Home / Self Care | Payer: BLUE CROSS/BLUE SHIELD | Source: Ambulatory Visit | Attending: Internal Medicine | Admitting: Internal Medicine

## 2018-04-19 ENCOUNTER — Inpatient Hospital Stay: Payer: Self-pay

## 2018-04-19 ENCOUNTER — Inpatient Hospital Stay (HOSPITAL_COMMUNITY)
Admission: AD | Admit: 2018-04-19 | Discharge: 2018-04-25 | DRG: 291 | Disposition: A | Discharge status: Skilled Nursing Facility | Payer: BLUE CROSS/BLUE SHIELD | Source: Ambulatory Visit | Attending: Internal Medicine | Admitting: Internal Medicine

## 2018-04-19 VITALS — BP 110/70 | HR 113 | Wt 99.0 lb

## 2018-04-19 DIAGNOSIS — Z951 Presence of aortocoronary bypass graft: Secondary | ICD-10-CM | POA: Diagnosis not present

## 2018-04-19 DIAGNOSIS — R0603 Acute respiratory distress: Secondary | ICD-10-CM | POA: Diagnosis not present

## 2018-04-19 DIAGNOSIS — I272 Pulmonary hypertension, unspecified: Secondary | ICD-10-CM | POA: Diagnosis present

## 2018-04-19 DIAGNOSIS — R0600 Dyspnea, unspecified: Secondary | ICD-10-CM | POA: Insufficient documentation

## 2018-04-19 DIAGNOSIS — Z85118 Personal history of other malignant neoplasm of bronchus and lung: Secondary | ICD-10-CM

## 2018-04-19 DIAGNOSIS — Z87891 Personal history of nicotine dependence: Secondary | ICD-10-CM | POA: Insufficient documentation

## 2018-04-19 DIAGNOSIS — Z8249 Family history of ischemic heart disease and other diseases of the circulatory system: Secondary | ICD-10-CM

## 2018-04-19 DIAGNOSIS — I110 Hypertensive heart disease with heart failure: Secondary | ICD-10-CM

## 2018-04-19 DIAGNOSIS — Z882 Allergy status to sulfonamides status: Secondary | ICD-10-CM

## 2018-04-19 DIAGNOSIS — E43 Unspecified severe protein-calorie malnutrition: Secondary | ICD-10-CM | POA: Diagnosis present

## 2018-04-19 DIAGNOSIS — I252 Old myocardial infarction: Secondary | ICD-10-CM | POA: Diagnosis not present

## 2018-04-19 DIAGNOSIS — I480 Paroxysmal atrial fibrillation: Secondary | ICD-10-CM | POA: Insufficient documentation

## 2018-04-19 DIAGNOSIS — Z7902 Long term (current) use of antithrombotics/antiplatelets: Secondary | ICD-10-CM | POA: Diagnosis not present

## 2018-04-19 DIAGNOSIS — C3491 Malignant neoplasm of unspecified part of right bronchus or lung: Secondary | ICD-10-CM | POA: Diagnosis not present

## 2018-04-19 DIAGNOSIS — Z681 Body mass index (BMI) 19 or less, adult: Secondary | ICD-10-CM | POA: Diagnosis not present

## 2018-04-19 DIAGNOSIS — F419 Anxiety disorder, unspecified: Secondary | ICD-10-CM | POA: Insufficient documentation

## 2018-04-19 DIAGNOSIS — C3490 Malignant neoplasm of unspecified part of unspecified bronchus or lung: Secondary | ICD-10-CM

## 2018-04-19 DIAGNOSIS — Z9981 Dependence on supplemental oxygen: Secondary | ICD-10-CM

## 2018-04-19 DIAGNOSIS — I214 Non-ST elevation (NSTEMI) myocardial infarction: Secondary | ICD-10-CM

## 2018-04-19 DIAGNOSIS — I351 Nonrheumatic aortic (valve) insufficiency: Secondary | ICD-10-CM

## 2018-04-19 DIAGNOSIS — Z91041 Radiographic dye allergy status: Secondary | ICD-10-CM

## 2018-04-19 DIAGNOSIS — R0609 Other forms of dyspnea: Secondary | ICD-10-CM

## 2018-04-19 DIAGNOSIS — J9600 Acute respiratory failure, unspecified whether with hypoxia or hypercapnia: Secondary | ICD-10-CM | POA: Diagnosis not present

## 2018-04-19 DIAGNOSIS — J918 Pleural effusion in other conditions classified elsewhere: Secondary | ICD-10-CM | POA: Diagnosis present

## 2018-04-19 DIAGNOSIS — D649 Anemia, unspecified: Secondary | ICD-10-CM | POA: Diagnosis present

## 2018-04-19 DIAGNOSIS — Z79899 Other long term (current) drug therapy: Secondary | ICD-10-CM

## 2018-04-19 DIAGNOSIS — Z885 Allergy status to narcotic agent status: Secondary | ICD-10-CM | POA: Diagnosis not present

## 2018-04-19 DIAGNOSIS — J9601 Acute respiratory failure with hypoxia: Secondary | ICD-10-CM | POA: Diagnosis not present

## 2018-04-19 DIAGNOSIS — R1310 Dysphagia, unspecified: Secondary | ICD-10-CM

## 2018-04-19 DIAGNOSIS — J9811 Atelectasis: Secondary | ICD-10-CM | POA: Diagnosis present

## 2018-04-19 DIAGNOSIS — I340 Nonrheumatic mitral (valve) insufficiency: Secondary | ICD-10-CM | POA: Diagnosis not present

## 2018-04-19 DIAGNOSIS — Z7982 Long term (current) use of aspirin: Secondary | ICD-10-CM

## 2018-04-19 DIAGNOSIS — I5023 Acute on chronic systolic (congestive) heart failure: Secondary | ICD-10-CM | POA: Diagnosis present

## 2018-04-19 DIAGNOSIS — I5022 Chronic systolic (congestive) heart failure: Secondary | ICD-10-CM

## 2018-04-19 DIAGNOSIS — Z9221 Personal history of antineoplastic chemotherapy: Secondary | ICD-10-CM

## 2018-04-19 DIAGNOSIS — I5043 Acute on chronic combined systolic (congestive) and diastolic (congestive) heart failure: Secondary | ICD-10-CM | POA: Diagnosis not present

## 2018-04-19 DIAGNOSIS — R570 Cardiogenic shock: Secondary | ICD-10-CM

## 2018-04-19 DIAGNOSIS — Z91040 Latex allergy status: Secondary | ICD-10-CM

## 2018-04-19 DIAGNOSIS — J90 Pleural effusion, not elsewhere classified: Secondary | ICD-10-CM | POA: Diagnosis not present

## 2018-04-19 DIAGNOSIS — J95811 Postprocedural pneumothorax: Secondary | ICD-10-CM

## 2018-04-19 DIAGNOSIS — R630 Anorexia: Secondary | ICD-10-CM | POA: Diagnosis present

## 2018-04-19 DIAGNOSIS — I2510 Atherosclerotic heart disease of native coronary artery without angina pectoris: Secondary | ICD-10-CM | POA: Diagnosis present

## 2018-04-19 DIAGNOSIS — D508 Other iron deficiency anemias: Secondary | ICD-10-CM

## 2018-04-19 DIAGNOSIS — I25810 Atherosclerosis of coronary artery bypass graft(s) without angina pectoris: Secondary | ICD-10-CM

## 2018-04-19 LAB — PROTIME-INR
INR: 1.16
Prothrombin Time: 14.7 seconds (ref 11.4–15.2)

## 2018-04-19 LAB — COMPREHENSIVE METABOLIC PANEL
ALT: 37 U/L (ref 14–54)
AST: 23 U/L (ref 15–41)
Albumin: 3.7 g/dL (ref 3.5–5.0)
Alkaline Phosphatase: 81 U/L (ref 38–126)
Anion gap: 9 (ref 5–15)
BILIRUBIN TOTAL: 0.6 mg/dL (ref 0.3–1.2)
BUN: 17 mg/dL (ref 6–20)
CO2: 21 mmol/L — ABNORMAL LOW (ref 22–32)
CREATININE: 0.83 mg/dL (ref 0.44–1.00)
Calcium: 9.5 mg/dL (ref 8.9–10.3)
Chloride: 110 mmol/L (ref 101–111)
GFR calc Af Amer: >60 mL/min (ref >60)
Glucose, Bld: 158 mg/dL — ABNORMAL HIGH (ref 65–99)
Potassium: 4 mmol/L (ref 3.5–5.1)
Sodium: 140 mmol/L (ref 135–145)
TOTAL PROTEIN: 7 g/dL (ref 6.5–8.1)

## 2018-04-19 LAB — POCT I-STAT 3, ART BLOOD GAS (G3+)
ACID-BASE DEFICIT: 3 mmol/L — AB (ref 0.0–2.0)
Bicarbonate: 21.5 mmol/L (ref 20.0–28.0)
O2 Saturation: 98 %
PCO2 ART: 35.3 mmHg (ref 32.0–48.0)
PH ART: 7.392 (ref 7.350–7.450)
TCO2: 23 mmol/L (ref 22–32)
pO2, Arterial: 110 mmHg — ABNORMAL HIGH (ref 83.0–108.0)

## 2018-04-19 LAB — T4, FREE: Free T4: 1.31 ng/dL (ref 0.82–1.77)

## 2018-04-19 LAB — LACTIC ACID, PLASMA: Lactic Acid, Venous: 1.2 mmol/L (ref 0.5–1.9)

## 2018-04-19 LAB — MRSA PCR SCREENING: MRSA by PCR: NEGATIVE

## 2018-04-19 LAB — TROPONIN I: TROPONIN I: 0.04 ng/mL — AB (ref <0.03)

## 2018-04-19 LAB — BRAIN NATRIURETIC PEPTIDE: B Natriuretic Peptide: 1663.5 pg/mL — ABNORMAL HIGH (ref 0.0–100.0)

## 2018-04-19 LAB — MAGNESIUM: MAGNESIUM: 1.9 mg/dL (ref 1.7–2.4)

## 2018-04-19 MED ORDER — ASPIRIN EC 81 MG PO TBEC
81.0000 mg | DELAYED_RELEASE_TABLET | Freq: Every day | ORAL | Status: DC
  Administered 2018-04-19 – 2018-04-25 (×7): 81 mg via ORAL
  Filled 2018-04-19 (×7): qty 1

## 2018-04-19 MED ORDER — ONDANSETRON HCL 4 MG/2ML IJ SOLN: 4.0000 mg | Freq: Four times a day (QID) | INTRAMUSCULAR | Status: DC | PRN

## 2018-04-19 MED ORDER — SODIUM CHLORIDE 0.9% FLUSH
10.0000 mL | Freq: Two times a day (BID) | INTRAVENOUS | Status: DC
  Administered 2018-04-19 – 2018-04-22 (×6): 10 mL
  Administered 2018-04-23: 20 mL
  Administered 2018-04-23 – 2018-04-25 (×3): 10 mL

## 2018-04-19 MED ORDER — ACETAMINOPHEN 325 MG PO TABS
650.0000 mg | ORAL_TABLET | ORAL | Status: DC | PRN
  Administered 2018-04-19: 650 mg via ORAL
  Filled 2018-04-19: qty 2

## 2018-04-19 MED ORDER — ORAL CARE MOUTH RINSE
15.0000 mL | Freq: Two times a day (BID) | OROMUCOSAL | Status: DC
  Administered 2018-04-19 – 2018-04-25 (×9): 15 mL via OROMUCOSAL

## 2018-04-19 MED ORDER — ATORVASTATIN CALCIUM 80 MG PO TABS
80.0000 mg | ORAL_TABLET | Freq: Every day | ORAL | Status: DC
  Administered 2018-04-19 – 2018-04-24 (×6): 80 mg via ORAL
  Filled 2018-04-19 (×6): qty 1

## 2018-04-19 MED ORDER — ENSURE ENLIVE PO LIQD
237.0000 mL | Freq: Three times a day (TID) | ORAL | Status: DC
  Administered 2018-04-19 – 2018-04-25 (×13): 237 mL via ORAL

## 2018-04-19 MED ORDER — ENOXAPARIN SODIUM 40 MG/0.4ML ~~LOC~~ SOLN
40.0000 mg | SUBCUTANEOUS | Status: DC
  Administered 2018-04-19 – 2018-04-21 (×3): 40 mg via SUBCUTANEOUS
  Filled 2018-04-19 (×3): qty 0.4

## 2018-04-19 MED ORDER — SODIUM CHLORIDE 0.9% FLUSH: 10.0000 mL | INTRAVENOUS | Status: DC | PRN

## 2018-04-19 MED ORDER — IPRATROPIUM-ALBUTEROL 0.5-2.5 (3) MG/3ML IN SOLN
3.0000 mL | Freq: Two times a day (BID) | RESPIRATORY_TRACT | Status: DC
  Administered 2018-04-19 – 2018-04-20 (×2): 3 mL via RESPIRATORY_TRACT
  Filled 2018-04-19 (×3): qty 3

## 2018-04-19 MED ORDER — FUROSEMIDE 10 MG/ML IJ SOLN
INTRAMUSCULAR | Status: AC
  Administered 2018-04-19: 80 mg via INTRAVENOUS
  Filled 2018-04-19: qty 8

## 2018-04-19 MED ORDER — CLOPIDOGREL BISULFATE 75 MG PO TABS
75.0000 mg | ORAL_TABLET | Freq: Every day | ORAL | Status: DC
  Administered 2018-04-19 – 2018-04-25 (×7): 75 mg via ORAL
  Filled 2018-04-19 (×7): qty 1

## 2018-04-19 MED ORDER — CHLORHEXIDINE GLUCONATE CLOTH 2 % EX PADS
6.0000 | MEDICATED_PAD | Freq: Every day | CUTANEOUS | Status: DC
  Administered 2018-04-20 – 2018-04-23 (×4): 6 via TOPICAL

## 2018-04-19 MED ORDER — FUROSEMIDE 10 MG/ML IJ SOLN
80.0000 mg | Freq: Once | INTRAMUSCULAR | Status: AC
  Administered 2018-04-19: 80 mg via INTRAVENOUS

## 2018-04-19 MED ORDER — SODIUM CHLORIDE 0.9 % IV SOLN: 250.0000 mL | INTRAVENOUS | Status: DC | PRN

## 2018-04-19 NOTE — H&P (Addendum)
Advanced Heart Failure Team History and Physical Note   PCP:  System, Pcp Not In  PCP-Cardiology: No primary care provider on file.     Reason for Admission: Unexplained Dyspnea.    HPI:   Brittany [name] a 57 y/o woman with a history of PAF,preveious smoker quit 4 years ago, hypertension, 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   Admitted 02/03/2018 with NSTEMI and shock. Underwent emergent cath 02/03/18 showed LAD 100% stenosed, LCx 95% stenosed. Taken for emergent CABG 02/03/18. Required impella post op. Hospital course complicated by cardiogenic shock, HCAP, A fib, respiratory failure, and swallowing issues. She was discharged to SNF. Discharge weight 103 pounds.   Today she returned to the  HF follow up. She was discharged from [facility] the end of April . After discharge she was unable to start any medications due to cost. She has not ben taking any medications. She has had a steady decline since discharge from SNF. Overall feeling terrible. SOB with exertion.  + Orthopnea. + PND. Says she is afraid to go to sleep. Poor appetite. Weight at home 99-100 pounds. She has [facility] and she was last seen on Monday. She has been working on disability.   CT of chest 1. No definite findings of locally recurrent tumor in the right lung. 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 Continued chest CT surveillance is advised in 3-6 months. 2. No thoracic adenopathy or other findings of metastatic disease in the chest. 3. Small to moderate dependent right pleural effusion, increased. 4. Stable mild cardiomegaly. 5. Mild patchy ground-glass opacities in both lungs are decreased in the interval, favor improved mild residual pulmonary edema. 6. New complete right middle lobe atelectasis.  ECHO   03/31/2018  Left ventricle: Basal inferior and anterior function preserved remainder of ventrical serverely hypokinetic with septal and apical akinesis The cavity size was severely dilated. Wall thickness was normal. The estimated ejection fraction was 25%. Doppler parameters are consistent with both elevated ventricular end-diastolic filling pressure and elevated left atrial filling pressure. - Aortic valve: There was moderate regurgitation. - Mitral valve: There was mild regurgitation. - Left atrium: The atrium was mildly dilated. - Atrial septum: No defect or patent foramen ovale was identified. ECHO 02/11/2018  - Left ventricle: The cavity size was normal. There was mild concentric hypertrophy. Systolic function was severely reduced. The estimated ejection fraction was in the range of 25% to 30%. There is akinesis of the mid-apical anterior, lateral, inferolateral, inferior, apical septal and apical myocardium. There is severe hypokinesis of the basalanteroseptal and inferoseptal myocardium. Features are consistent with a pseudonormal left ventricular filling pattern, with concomitant abnormal relaxation and increased filling pressure (grade 2 diastolic dysfunction). Doppler parameters are consistent with high ventricular filling pressure. - Aortic valve: There was severe regurgitation by pressure halftime of 133 msec but visually only appears moderate. - Mitral valve: Calcified annulus. There was trivial regurgitation.   LHC 02/03/2018    Prox RCA to Mid RCA lesion is 25% stenosed.  Ost Ramus lesion is 70% stenosed.  Ost Cx to Prox Cx lesion is 50% stenosed.  Ost LAD to Prox LAD lesion is 100% stenosed.  LV end diastolic pressure is severely elevated.  Hemodynamic findings consistent with severe pulmonary hypertension.  Post intervention, there is a 100% residual    Review of Systems: [y] =  yes, [ ]  = no   General: Weight gain [  ]; Weight loss [ ] ; Anorexia [ ] ; Fatigue [ ] ; Fever [ ] ; Chills [ ] ; Weakness [Y ]  Cardiac: Chest pain/pressure [ ] ; Resting SOB [Y ]; Exertional SOB [ Y]; Orthopnea [Y ]; Pedal Edema [ ] ; Palpitations [ ] ; Syncope [ ] ; Presyncope [ ] ; Paroxysmal nocturnal dyspnea[ ]   Pulmonary: Cough [ ] ; Wheezing[ ] ; Hemoptysis[ ] ; Sputum [ ] ; Snoring [ ]   GI: Vomiting[ ] ; Dysphagia[ ] ; Melena[ ] ; Hematochezia [ ] ; Heartburn[ ] ; Abdominal pain [ ] ; Constipation [ ] ; Diarrhea [ ] ; BRBPR [ ]   GU: Hematuria[ ] ; Dysuria [ ] ; Nocturia[ ]   Vascular: Pain in legs with walking [ ] ; Pain in feet with lying flat [ ] ; Non-healing sores [ ] ; Stroke [ ] ; TIA [ ] ; Slurred speech [ ] ;  Neuro: Headaches[ ] ; Vertigo[ ] ; Seizures[ ] ; Paresthesias[ ] ;Blurred vision [ ] ; Diplopia [ ] ; Vision changes [ ]   Ortho/Skin: Arthritis [ ] ; Joint pain [Y ]; Muscle pain [ ] ; Joint swelling [ ] ; Back Pain [ ] ; Rash [ ]   Psych: Depression[ Y]; Anxiety[Y ]  Heme: Bleeding problems [ ] ; Clotting disorders [ ] ; Anemia [ ]   Endocrine: Diabetes [ ] ; Thyroid dysfunction[ ]    Home Medications Prior to Admission medications   Medication Sig Start Date End Date Taking? Authorizing Provider  aspirin EC 81 MG EC tablet Take 1 tablet (81 mg total) by mouth daily. Patient not taking: Reported on 04/19/2018 02/25/18   Jadene Pierini E, PA-C  atorvastatin (LIPITOR) 80 MG tablet Take 1 tablet (80 mg total) by mouth daily at 6 PM. Patient not taking: Reported on 04/19/2018 02/25/18   John Giovanni, PA-C  clopidogrel (PLAVIX) 75 MG tablet Take 1 tablet (75 mg total) by mouth daily. Patient not taking: Reported on 04/19/2018 02/25/18   John Giovanni, PA-C  digoxin 62.5 MCG TABS Take 0.0625 mg by mouth daily. Patient not taking: Reported on 04/19/2018 02/25/18   Jadene Pierini E, PA-C  docusate sodium (COLACE) 100 MG capsule Take 1 capsule (100 mg total) by mouth 2 (two) times daily as needed for mild constipation. Patient not taking: Reported on 04/19/2018 03/20/18    Darrick Grinder D, NP  furosemide (LASIX) 40 MG tablet Take 1 tablet (40 mg total) by mouth daily. Patient not taking: Reported on 04/19/2018 02/25/18   Jadene Pierini E, PA-C  ipratropium-albuterol (DUONEB) 0.5-2.5 (3) MG/3ML SOLN Take 3 mLs by nebulization 2 (two) times daily. Patient not taking: Reported on 04/19/2018 02/25/18   Jadene Pierini E, PA-C  ivabradine (CORLANOR) 5 MG TABS tablet Take 1 tablet (5 mg total) by mouth 2 (two) times daily with a meal. Patient not taking: Reported on 04/19/2018 02/25/18   Jadene Pierini E, PA-C  losartan (COZAAR) 25 MG tablet Take 1 tablet (25 mg total) by mouth daily. Patient not taking: Reported on 04/19/2018 02/25/18   Jadene Pierini E, PA-C  oxyCODONE (OXY IR/ROXICODONE) 5 MG immediate release tablet Take 1-2 tablets (5-10 mg total) by mouth every 6 (six) hours as needed for severe pain. Patient not taking: Reported on 04/19/2018 02/25/18   John Giovanni, PA-C  spironolactone (ALDACTONE) 25 MG tablet Take 0.5 tablets (12.5 mg total) by mouth daily. Patient not taking: Reported on 04/19/2018 03/20/18   Conrad Minster, NP    Past Medical History: Past Medical History:  Diagnosis Date  . Acute systolic congestive heart failure ([place]) 02/03/2018  . Allergy   .  Anxiety   . Asthma   . Hypertension   . PAF (paroxysmal atrial fibrillation) ([place])   . Prophylactic measure 08/03/14-08/19/14   Prophyl. cranial radiation 24 Gy  . 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 Small cell lung cancer ([place]) 03/16/2014    Past Surgical History: Past Surgical History:  Procedure Laterality Date  . CESAREAN SECTION    . CORONARY ARTERY BYPASS GRAFT N/A 02/03/2018   Procedure: CORONARY ARTERY BYPASS GRAFTING (CABG);  Surgeon: Rexene Alberts, MD;  Location: [place];  Service: Open Heart Surgery;  Laterality: N/A;  Time 3 using left internal mammary artery and endoscopically  harvested right saphenous vein  . CORONARY BALLOON ANGIOPLASTY N/A 02/03/2018   Procedure: CORONARY BALLOON ANGIOPLASTY;  Surgeon: Martinique, Peter M, MD;  Location: [place] CV LAB;  Service: Cardiovascular;  Laterality: N/A;  . CORONARY/GRAFT ACUTE MI REVASCULARIZATION N/A 02/03/2018   Procedure: Coronary/Graft Acute MI Revascularization;  Surgeon: Martinique, Peter M, MD;  Location: [place] CV LAB;  Service: Cardiovascular;  Laterality: N/A;  . IABP INSERTION N/A 02/03/2018   Procedure: IABP Insertion;  Surgeon: Martinique, Peter M, MD;  Location: [place] [facility] LAB;  Service: Cardiovascular;  Laterality: N/A;  . INTRAOPERATIVE TRANSESOPHAGEAL ECHOCARDIOGRAM N/A 02/03/2018   Procedure: INTRAOPERATIVE TRANSESOPHAGEAL ECHOCARDIOGRAM;  Surgeon: Rexene Alberts, MD;  Location: [place];  Service: Open Heart Surgery;  Laterality: N/A;  . LEFT HEART CATH AND CORONARY ANGIOGRAPHY N/A 02/03/2018   Procedure: LEFT HEART CATH AND CORONARY ANGIOGRAPHY;  Surgeon: Martinique, Peter M, MD;  Location: [place] CV LAB;  Service: Cardiovascular;  Laterality: N/A;  . MEDIASTINOSCOPY N/A 03/11/2014   Procedure: MEDIASTINOSCOPY;  Surgeon: Melrose Nakayama, MD;  Location: [place];  Service: Thoracic;  Laterality: N/A;  . PLACEMENT OF [place] LEFT VENTRICULAR ASSIST DEVICE  02/03/2018   Procedure: PLACEMENT OF [place] LEFT VENTRICULAR ASSIST DEVICE LD;  Surgeon: Rexene Alberts, MD;  Location: [place];  Service: Open Heart Surgery;;  . REMOVAL OF IMPELLA LEFT VENTRICULAR ASSIST DEVICE N/A 02/08/2018   Procedure: REMOVAL OF [place] LEFT VENTRICULAR ASSIST DEVICE;  Surgeon: Rexene Alberts, MD;  Location: [place];  Service: Open Heart Surgery;  Laterality: N/A;  . RIGHT HEART CATH N/A 02/03/2018   Procedure: RIGHT HEART CATH;  Surgeon: Martinique, Peter M, MD;  Location: [place] CV LAB;  Service: Cardiovascular;  Laterality: N/A;  . TEE WITHOUT CARDIOVERSION N/A 02/08/2018   Procedure: TRANSESOPHAGEAL ECHOCARDIOGRAM (TEE);  Surgeon:  Rexene Alberts, MD;  Location: [place];  Service: Open Heart Surgery;  Laterality: N/A;  . TUBAL LIGATION    . VIDEO BRONCHOSCOPY WITH ENDOBRONCHIAL ULTRASOUND N/A 03/11/2014   Procedure: VIDEO BRONCHOSCOPY WITH ENDOBRONCHIAL ULTRASOUND;  Surgeon: Melrose Nakayama, MD;  [facility];  Service: Thoracic;  Laterality: N/A;    Family History:  Family History  Problem Relation Age of Onset  . Heart disease Mother   . Hypertension Mother   . Heart attack Mother   . Hypertension Maternal Grandmother   . Cancer Maternal Grandmother   . Diabetes Paternal Grandmother   . Stroke Neg Hx     Social History: Social History   Socioeconomic History  . Marital status: Married    Spouse name: Not on file  . Number of children: Not on file  . Years of education: Not on file  .  Highest education level: Not on file  Occupational History  . Not on file  Social Needs  . Financial resource strain: Not on file  . Food insecurity:    Worry: Not on file    Inability: Not on file  . Transportation needs:    Medical: Not on file    Non-medical: Not on file  Tobacco Use  . Smoking status: Former Smoker    Packs/day: 1.00    Years: 20.00    Pack years: 20.00    Types: Cigarettes    Last attempt to quit: 03/13/2014    Years since quitting: 4.1  . Smokeless tobacco: Never Used  Substance and Sexual Activity  . Alcohol use: Yes    Alcohol/week: 4.8 oz    Types: 8 Glasses of wine per week  . Drug use: No  . Sexual activity: Never  Lifestyle  . Physical activity:    Days per week: Not on file    Minutes per session: Not on file  . Stress: Not on file  Relationships  . Social connections:    Talks on phone: Not on file    Gets together: Not on file    Attends religious service: Not on file    Active member of club or organization: Not on file    Attends meetings of clubs or organizations: Not on file    Relationship status: Not on file  Other Topics Concern  . Not on file  Social  History Narrative  . Not on file    Allergies:  Allergies  Allergen Reactions  . Iodinated Diagnostic Agents Hives and Rash    Needs 13-hour prep before CT scans w/ contrast.  Can do Benadryl 50mg  PO for ESI's, etc  . Latex Hives and Other (See Comments)    Skin irritation  . Pineapple Hives  . Sulfa Antibiotics Hives and Rash  . Codeine Nausea And Vomiting    Objective:    Vital Signs:       There were no vitals filed for this visit.   Physical Exam     General: Dyspneic at rest. No respiratory difficulty HEENT: Normal Neck: Supple. no JVD. Carotids 2+ bilat; no bruits. No lymphadenopathy or thyromegaly appreciated. Cor: PMI nondisplaced. Regular rate & rhythm. No rubs, gallops or murmurs. Lungs: Clear Abdomen: Soft, nontender, nondistended. No hepatosplenomegaly. No bruits or masses. Good bowel sounds. Extremities: No cyanosis, clubbing, rash, edema Neuro: Alert & oriented x 3, cranial nerves grossly intact. moves all 4 extremities w/o difficulty. Affect pleasant.   Telemetry   Sinus Tach   EKG  Sinus Tach 111 bpm   Labs     Basic Metabolic Panel: No results for input(s): NA, K, CL, CO2, GLUCOSE, BUN, CREATININE, CALCIUM, MG, PHOS in the last 168 hours.  Liver Function Tests: No results for input(s): AST, ALT, ALKPHOS, BILITOT, PROT, ALBUMIN in the last 168 hours. No results for input(s): LIPASE, AMYLASE in the last 168 hours. No results for input(s): AMMONIA in the last 168 hours.  CBC: No results for input(s): WBC, NEUTROABS, HGB, HCT, MCV, PLT in the last 168 hours.  Cardiac Enzymes: No results for input(s): CKTOTAL, CKMB, CKMBINDEX, TROPONINI in the last 168 hours.  BNP: BNP (last 3 results) Recent Labs    02/03/18 0853 02/15/18 0449  BNP 3,127.4* 2,526.7*    ProBNP (last 3 results) No results for input(s): PROBNP in the last 8760 hours.   CBG: No results for input(s): GLUCAP in the last 168 hours.  Coagulation Studies: No results for  input(s): LABPROT, INR in the last 72 hours.  Imaging:  No results found.   Patient Profile  Brittany [name] a 57 y/o woman with a history of PAF,preveious smoker quit 4 years ago, hypertension, 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   Recent admit with NSTEMI and shock. Underwent emergent cath 02/03/18 showed LAD 100% stenosed, LCx 95% stenosed. Taken for emergent CABG 02/03/18. Required impella post op. Hospital course complicated by cardiogenic shock, HCAP, A fib, respiratory failure, and swallowing issues.  Admit from HF clinic with acute dyspnea.   Assessment/Plan   1. Dyspnea  Recent CT of chest decreased mass like consolidation, and new RML atelectasis.  CXR, repeat ECHO, PICC for CO-OX Possible low output.   2. 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 Cath with severe 2v CAD as above with severe LV dysfunction/ICM. s/p Emergent CABG 02/03/18.  - No s/s ischemia   - Restart plavix, asa, atorvastatin.   3. Chronic Systolic HF  H/O cardiogenic shock in setting of #1: TEE 02/03/18 LVEF 20%, Mild to Mod AR, Mild/Mod MR, RV normal. Impella out 3/1. Echo 3/4 EF 25-30% with moderate-severe aortic insufficiency which is likely contributing to CHF.  -Repeat ECHO today. Place PICC for CVP and CO-OX  - No BB with recent low output.  Hold off on arb, spiro for possible cardiogenic shock. .  4. PAF:  CHA2DS2/VASc is at least 4. (CHF, Vasc disease, HTN, Female).  - She has history of Afib RVR in the past in the setting of her Lung CA. Previously on Xarelto but stopped her medications.  -Today she is in sinus tach.    - Will continue ASA and Plavix. No anticoagulation with h/o lung caner.  5. 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  - Chest CT 02/19/18 with mass-like consolidation in R hilum concerning for recurrent tumor.  -Repeat CT 03/27/2018  -No definite findings of locally recurrent tumor in the right lung. 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 Continued chest CT surveillance is advised in 3-6 months. - Needs follow up with Dr Julien Nordmann.  6. Anemia:  -stable 9.1 on 03/14/2018  -Check CBC today  7. Aortic insufficiency: Moderate-severe on postop echo. Likely potentiating CHF.  -Repeat ECHO . 8. Dysphagia - Swallow studywith concerns for RLN palsy during recent hospitaliazation. This has resolved.    Admit to stepdown. She does not appear volume overloaded. Hold off on restarting arb, bb, dig, spiro, Check cbc, bmet,lactic acid.  CXR, ECHO. Place PICC and check CO-OX    Darrick Grinder, NP 04/19/2018, 12:22 PM  Advanced Heart Failure Team Pager [phone] (M-F; [place])  Please contact [facility] for night-coverage after hours (4p -7a ) and weekends on [email or web address]  Patient seen and examined with Darrick Grinder, NP. We discussed all aspects of the encounter. I agree with the assessment and plan as stated above.   57 y/o woman with COPD, SCLC recently admitted with NSTEMI and cardiogenic shock and underwent emergent CABG. EF  25-30%. Had slow post-op recovery with shock. Post-op echo with moderate AI. Presented to clinic today with progressive fatigue and SOB. No obvious evidence of volume overload on exam. + s3. Tachypneic. Suspect cardiogenic shock. Plan admit to ICU. Check echo. Place PICC to measure co-ox and CVP. Will need f/u imaging to assess  for recurrent CA.  Glori Bickers, MD  6:02 PM

## 2018-04-19 NOTE — Progress Notes (Signed)
PCP: Cardiologist: Dr Marlou Porch  Primary HF Cardiologist: Dr [name] CT Surgeon: Dr Roxy Manns Oncologist: Dr [name]   HPI: Brittany [name] a 57 y/o woman with a history of PAF,preveious smoker quit 4 years ago, hypertension, 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   Admitted 02/03/2018 with NSTEMI and shock. Underwent emergent cath 02/03/18 showed LAD 100% stenosed, LCx 95% stenosed. Taken for emergent CABG 02/03/18. Required impella post op. Hospital course complicated by cardiogenic shock, HCAP, A fib, respiratory failure, and swallowing issues. She was discharged to SNF. Discharge weight 103 pounds.   Today she returns for HF follow up. Last visit spiro was added.Marland Kitchen She was discharged from [facility]. After discharge she was unable to start any medications due to cost. She has not ben taking any medications. She has had a steady decline since discharge from SNF. Overall feeling terrible. SOB with exertion.  + Orthopnea. + PND. Says she is afraid to go to sleep. Poor appetite. Weight at home 99-100 pounds. She has [facility] and she was last seen on Monday. She has been working on disability.   CT of chest 1. No definite findings of locally recurrent tumor in the right lung. 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 Continued chest CT surveillance is advised in 3-6 months. 2. No thoracic adenopathy or other findings of metastatic disease in the chest. 3. Small to moderate dependent right pleural effusion, increased. 4. Stable mild cardiomegaly. 5. Mild patchy ground-glass opacities in both lungs are decreased in the interval, favor improved mild residual pulmonary edema. 6. New complete right middle lobe atelectasis.  ECHO  03/31/2018  Left ventricle: Basal inferior and anterior function preserved  remainder of ventrical serverely hypokinetic with septal and   apical akinesis The cavity size was severely dilated. Wall   thickness was normal. The estimated ejection fraction was 25%.   Doppler parameters are consistent with both elevated ventricular   end-diastolic filling pressure and elevated left atrial filling   pressure. - Aortic valve: There was moderate regurgitation. - Mitral valve: There was mild regurgitation. - Left atrium: The atrium was mildly dilated. - Atrial septum: No defect or patent foramen ovale was identified. ECHO 02/11/2018  - Left ventricle: The cavity size was normal. There was mild   concentric hypertrophy. Systolic function was severely reduced.   The estimated ejection fraction was in the range of 25% to 30%.   There is akinesis of the mid-apical anterior, lateral,   inferolateral, inferior, apical septal and apical myocardium.   There is severe hypokinesis of the basalanteroseptal and   inferoseptal myocardium. Features are consistent with a   pseudonormal left ventricular filling pattern, with concomitant   abnormal relaxation and increased filling pressure (grade 2   diastolic dysfunction). Doppler parameters are consistent with   high ventricular filling pressure. - Aortic valve: There was severe regurgitation by pressure halftime   of 133 msec but visually only appears moderate. - Mitral valve: Calcified annulus. There was trivial regurgitation.   LHC 02/03/2018    Prox RCA to Mid RCA lesion is 25% stenosed.  Ost Ramus lesion is 70% stenosed.  Ost Cx to Prox Cx lesion is 50% stenosed.  Ost LAD to Prox LAD lesion is 100% stenosed.  LV end diastolic pressure is severely elevated.  Hemodynamic findings consistent with severe pulmonary hypertension.  Post intervention, there is a  100% residual stenosis.  ROS: All systems negative except as listed in HPI, PMH and Problem List.  SH:  Social History   Socioeconomic History  . Marital  status: Married    Spouse name: Not on file  . Number of children: Not on file  . Years of education: Not on file  . Highest education level: Not on file  Occupational History  . Not on file  Social Needs  . Financial resource strain: Not on file  . Food insecurity:    Worry: Not on file    Inability: Not on file  . Transportation needs:    Medical: Not on file    Non-medical: Not on file  Tobacco Use  . Smoking status: Former Smoker    Packs/day: 1.00    Years: 20.00    Pack years: 20.00    Types: Cigarettes    Last attempt to quit: 03/13/2014    Years since quitting: 4.1  . Smokeless tobacco: Never Used  Substance and Sexual Activity  . Alcohol use: Yes    Alcohol/week: 4.8 oz    Types: 8 Glasses of wine per week  . Drug use: No  . Sexual activity: Never  Lifestyle  . Physical activity:    Days per week: Not on file    Minutes per session: Not on file  . Stress: Not on file  Relationships  . Social connections:    Talks on phone: Not on file    Gets together: Not on file    Attends religious service: Not on file    Active member of club or organization: Not on file    Attends meetings of clubs or organizations: Not on file    Relationship status: Not on file  . Intimate partner violence:    Fear of current or ex partner: Not on file    Emotionally abused: Not on file    Physically abused: Not on file    Forced sexual activity: Not on file  Other Topics Concern  . Not on file  Social History Narrative  . Not on file    FH:  Family History  Problem Relation Age of Onset  . Heart disease Mother   . Hypertension Mother   . Heart attack Mother   . Hypertension Maternal Grandmother   . Cancer Maternal Grandmother   . Diabetes Paternal Grandmother   . Stroke Neg Hx     Past Medical History:  Diagnosis Date  . Acute systolic congestive heart failure ([place]) 02/03/2018  . Allergy   . Anxiety   . Asthma   . Hypertension   . PAF (paroxysmal atrial  fibrillation) ([place])   . Prophylactic measure 08/03/14-08/19/14   Prophyl. cranial radiation 24 Gy  . 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 Small cell lung cancer ([place]) 03/16/2014    Current Outpatient Medications  Medication Sig Dispense Refill  . aspirin EC 81 MG EC tablet Take 1 tablet (81 mg total) by mouth daily. (Patient not taking: Reported on 04/19/2018)    . atorvastatin (LIPITOR) 80 MG tablet Take 1 tablet (80 mg total) by mouth daily at 6 PM. (Patient not taking: Reported on 04/19/2018)    . clopidogrel (PLAVIX) 75 MG tablet Take 1 tablet (75 mg total) by mouth daily. (Patient not taking: Reported on 04/19/2018)    . digoxin  62.5 MCG TABS Take 0.0625 mg by mouth daily. (Patient not taking: Reported on 04/19/2018)    . docusate sodium (COLACE) 100 MG capsule Take 1 capsule (100 mg total) by mouth 2 (two) times daily as needed for mild constipation. (Patient not taking: Reported on 04/19/2018) 60 capsule 3  . furosemide (LASIX) 40 MG tablet Take 1 tablet (40 mg total) by mouth daily. (Patient not taking: Reported on 04/19/2018) 30 tablet   . ipratropium-albuterol (DUONEB) 0.5-2.5 (3) MG/3ML SOLN Take 3 mLs by nebulization 2 (two) times daily. (Patient not taking: Reported on 04/19/2018) 360 mL   . ivabradine (CORLANOR) 5 MG TABS tablet Take 1 tablet (5 mg total) by mouth 2 (two) times daily with a meal. (Patient not taking: Reported on 04/19/2018) 60 tablet   . losartan (COZAAR) 25 MG tablet Take 1 tablet (25 mg total) by mouth daily. (Patient not taking: Reported on 04/19/2018)    . oxyCODONE (OXY IR/ROXICODONE) 5 MG immediate release tablet Take 1-2 tablets (5-10 mg total) by mouth every 6 (six) hours as needed for severe pain. (Patient not taking: Reported on 04/19/2018) 30 tablet 0  . spironolactone (ALDACTONE) 25 MG tablet Take 0.5 tablets (12.5 mg total) by mouth daily.  (Patient not taking: Reported on 04/19/2018) 45 tablet 3   No current facility-administered medications for this encounter.    Facility-Administered Medications Ordered in Other Encounters  Medication Dose Route Frequency Provider Last Rate Last Dose  . etomidate (AMIDATE) injection    Anesthesia Intra-op [name], CRNA   12 mg at 02/03/18 1130  . succinylcholine (ANECTINE) injection    Anesthesia Intra-op [name], CRNA   60 mg at 02/03/18 1130    Vitals:   04/19/18 1105  BP: 110/70  Pulse: (!) 113  SpO2: 100%  Weight: 99 lb (44.9 kg)    PHYSICAL EXAM: General: Dyspneic at rest. Arrived in a wheel chair.  HEENT: normal Neck: supple. no JVD. Carotids 2+ bilat; no bruits. No lymphadenopathy or thryomegaly appreciated. Cor: PMI nondisplaced. Tachy, Regular rate & rhythm. No rubs, gallops or murmurs. Lungs: Decreased in the bases.  Abdomen: soft, nontender, nondistended. No hepatosplenomegaly. No bruits or masses. Good bowel sounds. Extremities: no cyanosis, clubbing, rash, edema Neuro: alert & orientedx3, cranial nerves grossly intact. moves all 4 extremities w/o difficulty. Affect pleasant   ECG: Sinus Tach 111 bpm   ASSESSMENT & PLAN: 1. Dyspnea  Recent CT of chest decreased mass like consolidation, and new RML atelectasis.  CXR, repeat ECHO, PICC for CO-OX Possible low output.   2. 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  Cath with severe 2v CAD as above with severe LV dysfunction/ICM. s/p Emergent CABG 02/03/18.   - No s/s ischemia      - Restart plavix, asa, atorvastatin.   3. Chronic Systolic HF  H/O cardiogenic shock in setting of #1: TEE 02/03/18 LVEF 20%, Mild to Mod AR, Mild/Mod MR, RV normal. Impella out 3/1.  Echo 3/4 EF 25-30% with moderate-severe aortic insufficiency which is likely contributing to CHF.  - No BB with recent low output.  Hold off on arb, spiro with recent cardiogenic shock.  4. PAF:  CHA2DS2/VASc is at least  4. (CHF, Vasc disease, HTN, Female).   - She has history of Afib RVR in the past in the setting of her Lung CA. Previously on Xarelto but stopped her medications.  -Today she is in sinus tach.    - Will continue ASA and Plavix. No  anticoagulation with h/o lung caner.  5. 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  - Chest CT 02/19/18 with mass-like consolidation in R hilum concerning for recurrent tumor.  -Repeat CT 03/27/2018 -No definite findings of locally recurrent tumor in the right lung. 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 Continued chest CT surveillance is advised in 3-6 months. - She will then follow up with Dr Julien Nordmann.  6. Anemia:  - stable 9.1 on 03/14/2018  -Check CBC  7. Aortic insufficiency: Moderate-severe on postop echo.  Likely potentiating CHF.   -Repeat ECHO .  8. Dysphagia - Swallow study with concerns for RLN palsy during recent hospitaliazation. This has resolved.    Admit to stepdown. Check cbc, bmet. CXR, ECHO. Place PICC and check CO-OX   Discussed with Dr [name].   Brittany Puskas  NP-C  11:12 AM

## 2018-04-19 NOTE — Progress Notes (Signed)
Peripherally Inserted Central Catheter/Midline Placement  The IV Nurse has discussed with the patient and/or persons authorized to consent for the patient, the purpose of this procedure and the potential benefits and risks involved with this procedure.  The benefits include less needle sticks, lab draws from the catheter, and the patient may be discharged home with the catheter. Risks include, but not limited to, infection, bleeding, blood clot (thrombus formation), and puncture of an artery; nerve damage and irregular heartbeat and possibility to perform a PICC exchange if needed/ordered by physician.  Alternatives to this procedure were also discussed.  Bard Power PICC patient education guide, fact sheet on infection prevention and patient information card has been provided to patient /or left at bedside.    PICC/Midline Placement Documentation  PICC Double Lumen 74/71/85 PICC Right Basilic 35 cm 0 cm (Active)  Indication for Insertion or Continuance of Line Chronic illness with exacerbations (CF, Sickle Cell, etc.) 04/19/2018  9:13 PM  Exposed Catheter (cm) 0 cm 04/19/2018  9:13 PM  Site Assessment Clean;Dry;Intact 04/19/2018  9:13 PM  Lumen #1 Status Flushed;Saline locked;Blood return noted 04/19/2018  9:13 PM  Lumen #2 Status Saline locked;Flushed;Blood return noted 04/19/2018  9:13 PM  Dressing Type Transparent 04/19/2018  9:13 PM  Dressing Status Clean;Dry;Intact;Antimicrobial disc in place 04/19/2018  9:13 PM  Dressing Change Due 04/26/18 04/19/2018  9:13 PM       Gordan Payment 04/19/2018, 9:15 PM

## 2018-04-19 NOTE — Progress Notes (Signed)
Initial Nutrition Assessment  DOCUMENTATION CODES:   Severe malnutrition in context of chronic illness  INTERVENTION:  Ensure Enlive po TID, each supplement provides 350 kcal and 20 grams of protein - chocolate only Magic cup TID with meals, each supplement provides 290 kcal and 9 grams of protein. - orange only MVI w/ mineral - daily  NEED NEW NFPE  NUTRITION DIAGNOSIS:   Severe Malnutrition related to chronic illness(CHF s/p CABG) as evidenced by energy intake < or equal to 75% for > or equal to 1 month, percent weight loss(15% weight loss in 4 months).  GOAL:   Patient will meet greater than or equal to 90% of their needs  MONITOR:   PO intake, Supplement acceptance, Skin, Weight trends, Labs, I & O's  REASON FOR ASSESSMENT:   Malnutrition Screening Tool    ASSESSMENT:   57 y.o. F admitted on 5/10 for HF follow-up. Pt with PMH of small cell lung cancer s/p chemotherapy and radiation (2016), HTN, CHF. Last admit 02/03/18 pt with cardiogenic shock in setting of STEMI requiring emergent CABG with Impella placement. During hospital stay pt had various complications including HCAP, A fib, respiratory failure, and swallowing issues. Pt discharged to SNF. Pt diagnosed 02/13/18 with severe malnutrition in the context of acute illness as evidenced by energy intake < or equal to 50% for > or equal to 5 days, moderate muscle depletion, mild fat depletion; developed over 02/03/18 admission.    Unable to complete NFPE as X-ray team arrived. NEED NEW EXAM.   Upon visit pt seemed to be in pain.  Pt reports weight loss over last admission due to prolonged liquid diet status; she reports since then she hasn't been able to gain weight back.  Pt reports before CABG UBW 117-124 lbs, now 99 lbs. Upon physical exam pt expressed how much muscle she loss. Pt is open to anything that will make her gain weight. Pt also reports that she is feeling very weak.   Pt reports drinking ensure BID, but will  only drink chocolate. Pt reports that her appetite has gone down since her last admission and surgery, but was at it's worst over the past week; within the past week she has eaten less than a fistful of food eat day and that she is not hungry, her stomach feels full after only some food, and has to force herself to eat.    Spoke with RN about adding supplements, RN reports upon arrival pt requested ensure.  Discussed possibility of liberalizing diet to regular as pt is severely malnourished with provider; provider reports pt cardiac status not well enough for liberalization, but after testing done can re-evaluate.   Pt lost 15% body weight in 4 months - significant.   Wt readings from Last 7 Encounters: 04/19/18 99 lb 6.8 oz (45.1 kg)  04/19/18 99 lb (44.9 kg)  04/08/18 101 lb (45.8 kg)  03/20/18 103 lb 3.2 oz (46.8 kg)  03/14/18 102 lb 6.4 oz (46.4 kg)  02/26/18 101 lb 9.6 oz (46.1 kg)  12/19/17 116 lb 12.8 oz (53 kg)   Medications reviewed: NaCl 0.9% 250 ml ordered - not started yet.   Labs reviewed: CO2 21 (L), BG 158 (H), BNP 1663.5 (H), troponin 0.04 (H).   NUTRITION - FOCUSED PHYSICAL EXAM:  Unable to complete NFPE as X-ray team arrived. NEED NEW EXAM.     Most Recent Value  Orbital Region  Unable to assess  Upper Arm Region  Unable to assess  Thoracic and  Lumbar Region  Unable to assess  Buccal Region  Unable to assess  Temple Region  Unable to assess  Clavicle Bone Region  Unable to assess  Clavicle and Acromion Bone Region  Unable to assess  Scapular Bone Region  Unable to assess  Dorsal Hand  No depletion  Patellar Region  Severe depletion  Anterior Thigh Region  Severe depletion  Posterior Calf Region  Severe depletion  Edema (RD Assessment)  Unable to assess  Hair  Reviewed  Eyes  Reviewed  Mouth  Unable to assess  Skin  Reviewed  Nails  Reviewed       Diet Order:   Diet Order           Diet 2 gram sodium Room service appropriate? Yes; Fluid consistency:  Thin  Diet effective now          EDUCATION NEEDS:   Education needs have been addressed  Skin:     Last BM:  Unknown  Height:   Ht Readings from Last 1 Encounters:  04/08/18 4' 11.5" (1.511 m)    Weight:   Wt Readings from Last 1 Encounters:  04/19/18 99 lb 6.8 oz (45.1 kg)    Ideal Body Weight:  71.59 kg  BMI:  Body mass index is 19.75 kg/m.  Estimated Nutritional Needs:   Kcal:  1500-1700 kcal  Protein:  75-90 grams  Fluid:  >/= 1.4 L or per MD    Hope Budds, Dietetic Intern

## 2018-04-20 ENCOUNTER — Inpatient Hospital Stay (HOSPITAL_COMMUNITY): Payer: BLUE CROSS/BLUE SHIELD

## 2018-04-20 DIAGNOSIS — C3490 Malignant neoplasm of unspecified part of unspecified bronchus or lung: Secondary | ICD-10-CM

## 2018-04-20 DIAGNOSIS — I5023 Acute on chronic systolic (congestive) heart failure: Secondary | ICD-10-CM

## 2018-04-20 DIAGNOSIS — J9600 Acute respiratory failure, unspecified whether with hypoxia or hypercapnia: Secondary | ICD-10-CM

## 2018-04-20 DIAGNOSIS — I351 Nonrheumatic aortic (valve) insufficiency: Secondary | ICD-10-CM

## 2018-04-20 DIAGNOSIS — I2510 Atherosclerotic heart disease of native coronary artery without angina pectoris: Secondary | ICD-10-CM

## 2018-04-20 DIAGNOSIS — I340 Nonrheumatic mitral (valve) insufficiency: Secondary | ICD-10-CM

## 2018-04-20 LAB — PROCALCITONIN: Procalcitonin: <0.1 ng/mL

## 2018-04-20 LAB — BASIC METABOLIC PANEL
Anion gap: 7 (ref 5–15)
BUN: 20 mg/dL (ref 6–20)
CHLORIDE: 107 mmol/L (ref 101–111)
CO2: 27 mmol/L (ref 22–32)
Calcium: 9.2 mg/dL (ref 8.9–10.3)
Creatinine, Ser: 0.88 mg/dL (ref 0.44–1.00)
GFR calc Af Amer: >60 mL/min (ref >60)
GFR calc non Af Amer: >60 mL/min (ref >60)
Glucose, Bld: 113 mg/dL — ABNORMAL HIGH (ref 65–99)
POTASSIUM: 3.6 mmol/L (ref 3.5–5.1)
SODIUM: 141 mmol/L (ref 135–145)

## 2018-04-20 LAB — COOXEMETRY PANEL
CARBOXYHEMOGLOBIN: 1.8 % — AB (ref 0.5–1.5)
Methemoglobin: 0.7 % (ref 0.0–1.5)
O2 SAT: 65.4 %
TOTAL HEMOGLOBIN: 14.6 g/dL (ref 12.0–16.0)

## 2018-04-20 LAB — ECHOCARDIOGRAM COMPLETE: WEIGHTICAEL: 1594.37 [oz_av]

## 2018-04-20 LAB — T3, FREE: T3 FREE: 2.4 pg/mL (ref 2.0–4.4)

## 2018-04-20 LAB — TSH: TSH: 0.766 u[IU]/mL (ref 0.350–4.500)

## 2018-04-20 MED ORDER — IPRATROPIUM-ALBUTEROL 0.5-2.5 (3) MG/3ML IN SOLN
3.0000 mL | Freq: Two times a day (BID) | RESPIRATORY_TRACT | Status: DC
  Administered 2018-04-20 – 2018-04-25 (×10): 3 mL via RESPIRATORY_TRACT
  Filled 2018-04-20 (×9): qty 3

## 2018-04-20 MED ORDER — DIPHENHYDRAMINE HCL 25 MG PO CAPS
50.0000 mg | ORAL_CAPSULE | Freq: Once | ORAL | Status: AC
  Administered 2018-04-20: 50 mg via ORAL
  Filled 2018-04-20: qty 2

## 2018-04-20 MED ORDER — DIPHENHYDRAMINE HCL 50 MG/ML IJ SOLN: 50.0000 mg | Freq: Once | INTRAMUSCULAR | Status: AC

## 2018-04-20 MED ORDER — OXYCODONE HCL 5 MG PO TABS
5.0000 mg | ORAL_TABLET | Freq: Four times a day (QID) | ORAL | Status: DC | PRN
  Administered 2018-04-20 – 2018-04-24 (×7): 5 mg via ORAL
  Filled 2018-04-20 (×7): qty 1

## 2018-04-20 MED ORDER — DIGOXIN 125 MCG PO TABS
0.0625 mg | ORAL_TABLET | Freq: Every day | ORAL | Status: DC
  Administered 2018-04-20 – 2018-04-25 (×6): 0.0625 mg via ORAL
  Filled 2018-04-20 (×6): qty 1

## 2018-04-20 MED ORDER — SPIRONOLACTONE 25 MG PO TABS
12.5000 mg | ORAL_TABLET | Freq: Every day | ORAL | Status: DC
  Administered 2018-04-20 – 2018-04-25 (×6): 12.5 mg via ORAL
  Filled 2018-04-20: qty 0.5
  Filled 2018-04-20 (×4): qty 1
  Filled 2018-04-20: qty 0.5

## 2018-04-20 MED ORDER — LOSARTAN POTASSIUM 25 MG PO TABS
25.0000 mg | ORAL_TABLET | Freq: Every day | ORAL | Status: DC
  Administered 2018-04-20 – 2018-04-22 (×3): 25 mg via ORAL
  Filled 2018-04-20 (×3): qty 1

## 2018-04-20 MED ORDER — IOHEXOL 300 MG/ML  SOLN
75.0000 mL | Freq: Once | INTRAMUSCULAR | Status: AC | PRN
  Administered 2018-04-20: 75 mL via INTRAVENOUS

## 2018-04-20 MED ORDER — DIPHENHYDRAMINE HCL 25 MG PO CAPS: 50.0000 mg | ORAL_CAPSULE | Freq: Once | ORAL | Status: AC

## 2018-04-20 MED ORDER — PREDNISONE 50 MG PO TABS
50.0000 mg | ORAL_TABLET | Freq: Four times a day (QID) | ORAL | Status: AC
  Administered 2018-04-20 (×3): 50 mg via ORAL
  Filled 2018-04-20 (×3): qty 1

## 2018-04-20 MED ORDER — PREDNISONE 50 MG PO TABS
50.0000 mg | ORAL_TABLET | Freq: Four times a day (QID) | ORAL | Status: AC
  Administered 2018-04-21 (×2): 50 mg via ORAL
  Filled 2018-04-20 (×2): qty 1

## 2018-04-20 MED ORDER — IVABRADINE HCL 5 MG PO TABS
5.0000 mg | ORAL_TABLET | Freq: Two times a day (BID) | ORAL | Status: DC
  Administered 2018-04-20 – 2018-04-22 (×5): 5 mg via ORAL
  Filled 2018-04-20 (×6): qty 1

## 2018-04-20 NOTE — Progress Notes (Signed)
  Echocardiogram 2D Echocardiogram has been performed.  Brittany [name] 04/20/2018, 9:06 AM

## 2018-04-20 NOTE — Progress Notes (Addendum)
Advanced Heart Failure Rounding Note  PCP-Cardiologist: No primary care provider on file.   Subjective:    Brittany [name] a 57 y/o woman with a history of PAF,previous smoker quit 4 years ago, hypertension,small cell lung cancer treated with chemo, chest XRT and prophylacticbrain radiation in 2015. Admitted 02/03/2018 with NSTEMI and shock. Underwent emergent cath 02/03/18 showed LAD 100% stenosed, LCx 95% stenosed. Taken for emergent CABG 02/03/18. Required impella post op. Post-op EF 25% with moderate AI.   Readmitted 5/10 with worsening SOB and concern for low ouput.   Was on BIPAP overnight. ABG 7.39/35/110/98 (prior to BIPAP)  Continues to feel  SOB and anxious at times. Saying she can't breathe well but appears a bit better after mild diuresis. Weight unchanged. No CP   Echo this am reviewed personally EF 15% RV moderately down mild to moderate AI. No effusion   XR moderate R effusion and consolidation. Otherwise clear. (Personally reviewed)  PICC placed CVP 6. Co-ox 65%. BNP 1,664   Objective:   Weight Range: 45.2 kg (99 lb 10.4 oz) Body mass index is 19.79 kg/m.   Vital Signs:   Temp:  [97.5 F (36.4 C)-98.4 F (36.9 C)] 98.1 F (36.7 C) (05/11 0736) Pulse Rate:  [95-121] 99 (05/11 0700) Resp:  [13-29] 29 (05/11 0700) BP: (89-137)/(61-96) 115/85 (05/11 0700) SpO2:  [96 %-100 %] 99 % (05/11 0911) FiO2 (%):  [30 %-40 %] 30 % (05/10 2117) Weight:  [44.9 kg (99 lb)-45.2 kg (99 lb 10.4 oz)] 45.2 kg (99 lb 10.4 oz) (05/11 0500)    Weight change: Filed Weights   04/19/18 1448 04/20/18 0500  Weight: 45.1 kg (99 lb 6.8 oz) 45.2 kg (99 lb 10.4 oz)    Intake/Output:   Intake/Output Summary (Last 24 hours) at 04/20/2018 0940 Last data filed at 04/20/2018 0600 Gross per 24 hour  Intake 290 ml  Output 550 ml  Net -260 ml      Physical Exam    General:  Thin. Anxious appearing NAD HEENT: Normal Neck: Supple. JVP 6 . Carotids 2+ bilat; no bruits. No  lymphadenopathy or thyromegaly appreciated. Cor: PMI nondisplaced. Regular rate & rhythm. No rubs, gallops or murmurs. Lungs: Clear except dull at right base  Abdomen: Soft, nontender, nondistended. No hepatosplenomegaly. No bruits or masses. Good bowel sounds. Extremities: No cyanosis, clubbing, rash, edema RUP PICC Neuro: Alert & orientedx3, cranial nerves grossly intact. moves all 4 extremities w/o difficulty. Affect pleasant   Telemetry   NSR 95-100 Personally reviewed  EKG    N/a  Labs    CBC No results for input(s): WBC, NEUTROABS, HGB, HCT, MCV, PLT in the last 72 hours. Basic Metabolic Panel Recent Labs    04/19/18 1218 04/20/18 0500  NA 140 141  K 4.0 3.6  CL 110 107  CO2 21* 27  GLUCOSE 158* 113*  BUN 17 20  CREATININE 0.83 0.88  CALCIUM 9.5 9.2  MG 1.9  --    Liver Function Tests Recent Labs    04/19/18 1218  AST 23  ALT 37  ALKPHOS 81  BILITOT 0.6  PROT 7.0  ALBUMIN 3.7   No results for input(s): LIPASE, AMYLASE in the last 72 hours. Cardiac Enzymes Recent Labs    04/19/18 1218  TROPONINI 0.04*    BNP: BNP (last 3 results) Recent Labs    02/03/18 0853 02/15/18 0449 04/19/18 1218  BNP 3,127.4* 2,526.7* 1,663.5*    ProBNP (last 3 results) No results for input(s): PROBNP in  the last 8760 hours.   D-Dimer No results for input(s): DDIMER in the last 72 hours. Hemoglobin A1C No results for input(s): HGBA1C in the last 72 hours. Fasting Lipid Panel No results for input(s): CHOL, HDL, LDLCALC, TRIG, CHOLHDL, LDLDIRECT in the last 72 hours. Thyroid Function Tests Recent Labs    04/19/18 1218  T3FREE 2.4    Other results:   Imaging    Dg Chest Port 1 View  Result Date: 04/19/2018 CLINICAL DATA:  Recent worsening of dyspnea. EXAM: PORTABLE CHEST 1 VIEW COMPARISON:  04/08/2018. FINDINGS: The cardiac silhouette is markedly increased, possible pericardial effusion. Increasing RIGHT pleural effusion as well. Consolidation or  atelectasis at the RIGHT base. Median sternotomy for CABG. IMPRESSION: Worsening aeration and increasing size of the cardiac silhouette. See discussion above. Electronically Signed   By: Staci Righter M.D.   On: 04/19/2018 16:35   [place] Ekg Site Rite  Result Date: 04/19/2018 If Site Rite image not attached, placement could not be confirmed due to current cardiac rhythm.     Medications:     Scheduled Medications: . aspirin EC  81 mg Oral Daily  . atorvastatin  80 mg Oral q1800  . Chlorhexidine Gluconate Cloth  6 each Topical Daily  . clopidogrel  75 mg Oral Daily  . enoxaparin (LOVENOX) injection  40 mg Subcutaneous Q24H  . feeding supplement (ENSURE ENLIVE)  237 mL Oral TID BM  . ipratropium-albuterol  3 mL Nebulization BID  . mouth rinse  15 mL Mouth Rinse BID  . sodium chloride flush  10-40 mL Intracatheter Q12H     Infusions: . sodium chloride       PRN Medications:  sodium chloride, acetaminophen, ondansetron (ZOFRAN) IV, sodium chloride flush    Patient Profile   Brittany [name] a 57 y/o woman with a history of PAF,previous smoker quit 4 years ago, hypertension,small cell lung cancer treated with chemo, chest XRT and prophylacticbrain radiation in 2015. Admitted 02/03/2018 with NSTEMI and shock. Underwent emergent cath 02/03/18 showed LAD 100% stenosed, LCx 95% stenosed. Taken for emergent CABG 02/03/18. Required impella post op. Post-op EF 25% with moderate AI.   Readmitted 5/10 with worsening SOB and concern for low ouput.   Assessment/Plan   1. Acute respiratory failure - remains markedly symptomatic though a bit out of proportion to objective findings - likely multifactorial CHF, COPD and R effusion/airspace disease with possible recurrent CA - initial concern was for low-output shock but co-ox ok - Check PCT. Check CT chest with contrast. May need thoracentesis - Continue diuresis as tolerated  2. Acute on chronic systolic HF due to ICM - Echo  today EF 15-20% RV moderateey down mild to moderate AI (Personally reviewed) - restart digoxin, losartan, ivabradine and spiro.  - no b-blocker yet with low output - diurese as tolerated  3. SCLC - s/p treatment in 2015 but then lost to f/u - pre-op imaging suggested possible recurrence - check CT today  4. 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 Continue ASA, statin - no b-blocker yet with low output  5. Mild to moderate aortic insufficiency - stable  6. Anxiety  - prn xanax - add SSRI as toelrated  CRITICAL CARE Performed by: Glori Bickers  Total critical care time: 35 minutes  Critical care time was exclusive of separately billable procedures and treating other patients.  Critical care was necessary to treat or prevent imminent or life-threatening deterioration.  Critical care was time spent personally by me (independent of midlevel  providers or residents) on the following activities: development of treatment plan with patient and/or surrogate as well as nursing, discussions with consultants, evaluation of patient's response to treatment, examination of patient, obtaining history from patient or surrogate, ordering and performing treatments and interventions, ordering and review of laboratory studies, ordering and review of radiographic studies, pulse oximetry and re-evaluation of patient's condition.    Length of Stay: 1  Glori Bickers, MD  04/20/2018, 9:40 AM  Advanced Heart Failure Team Pager [phone] (M-F; 7a - 4p)  Please contact [facility] for night-coverage after hours (4p -7a ) and weekends on [email or web address]

## 2018-04-20 NOTE — Progress Notes (Signed)
RT placed patient back on BIPAP for patient comfort. Patient tolerating well at this time.

## 2018-04-21 ENCOUNTER — Inpatient Hospital Stay (HOSPITAL_COMMUNITY): Payer: BLUE CROSS/BLUE SHIELD

## 2018-04-21 DIAGNOSIS — C3491 Malignant neoplasm of unspecified part of right bronchus or lung: Secondary | ICD-10-CM

## 2018-04-21 DIAGNOSIS — J90 Pleural effusion, not elsewhere classified: Secondary | ICD-10-CM

## 2018-04-21 LAB — BODY FLUID CELL COUNT WITH DIFFERENTIAL
Eos, Fluid: 30 %
LYMPHS FL: 21 %
Monocyte-Macrophage-Serous Fluid: 37 % — ABNORMAL LOW (ref 50–90)
Neutrophil Count, Fluid: 9 % (ref 0–25)
Total Nucleated Cell Count, Fluid: 1101 cu mm — ABNORMAL HIGH (ref 0–1000)

## 2018-04-21 LAB — BASIC METABOLIC PANEL
Anion gap: 7 (ref 5–15)
BUN: 24 mg/dL — AB (ref 6–20)
CHLORIDE: 103 mmol/L (ref 101–111)
CO2: 27 mmol/L (ref 22–32)
CREATININE: 0.87 mg/dL (ref 0.44–1.00)
Calcium: 9.5 mg/dL (ref 8.9–10.3)
GFR calc Af Amer: >60 mL/min (ref >60)
GFR calc non Af Amer: >60 mL/min (ref >60)
GLUCOSE: 199 mg/dL — AB (ref 65–99)
Potassium: 4.8 mmol/L (ref 3.5–5.1)
Sodium: 137 mmol/L (ref 135–145)

## 2018-04-21 LAB — GRAM STAIN

## 2018-04-21 LAB — PROTEIN, PLEURAL OR PERITONEAL FLUID: Total protein, fluid: 3.5 g/dL

## 2018-04-21 LAB — LACTATE DEHYDROGENASE, PLEURAL OR PERITONEAL FLUID: LD FL: 172 U/L — AB (ref 3–23)

## 2018-04-21 MED ORDER — LIDOCAINE HCL (PF) 1 % IJ SOLN
INTRAMUSCULAR | Status: AC
  Filled 2018-04-21: qty 30

## 2018-04-21 NOTE — Progress Notes (Signed)
TCTS BRIEF PROGRESS NOTE   Patient is well known to me from her previous hospitalization and surgery.  The "17 mm outpouching from ascending aorta" seen on CT scan corresponds to the stump of the prosthetic graft used for placement of the Impella LVAD during her previous surgery.  It is expected and nothing to be concerned about.  Rexene Alberts, MD 04/21/2018 9:03 PM

## 2018-04-21 NOTE — Procedures (Signed)
Interventional Radiology Procedure Note  Procedure: US guided right thoracentesis.  ~480cc removed for sample Complications: None Recommendations:  - CXR now. - routine wound care  Signed,  Dulcy Fanny. Earleen Newport, DO

## 2018-04-21 NOTE — Progress Notes (Signed)
Advanced Heart Failure Rounding Note  PCP-Cardiologist: No primary care provider on file.   Subjective:    Brittany [name] a 57 y/o woman with a history of PAF,previous smoker quit 4 years ago, hypertension,small cell lung cancer treated with chemo, chest XRT and prophylacticbrain radiation in 2015. Admitted 02/03/2018 with NSTEMI and shock. Underwent emergent cath 02/03/18 showed LAD 100% stenosed, LCx 95% stenosed. Taken for emergent CABG 02/03/18. Required impella post op. Post-op EF 25% with moderate AI.   Readmitted 5/10 with worsening SOB and concern for low ouput.   Was on BIPAP again overnight.   Feeling better but still SOB. CT scan of chest yesterday. With possible recurrence of SCLC and large left right effusion. 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    Echo EF 15% RV moderately down mild to moderate AI. No effusion   PICC placed CVP 5. Co-ox 65%.   Objective:   Weight Range: 46.2 kg (101 lb 13.6 oz) Body mass index is 20.23 kg/m.   Vital Signs:   Temp:  [98 F (36.7 C)-98.7 F (37.1 C)] 98 F (36.7 C) (05/11 2300) Pulse Rate:  [75-110] 94 (05/12 0800) Resp:  [11-24] 18 (05/12 0800) BP: (94-120)/(60-78) 111/67 (05/12 0800) SpO2:  [99 %-100 %] 100 % (05/12 0913) FiO2 (%):  [30 %] 30 % (05/12 0000) Weight:  [46.2 kg (101 lb 13.6 oz)] 46.2 kg (101 lb 13.6 oz) (05/12 0600) Last BM Date: (PTA)  Weight change: Filed Weights   04/19/18 1448 04/20/18 0500 04/21/18 0600  Weight: 45.1 kg (99 lb 6.8 oz) 45.2 kg (99 lb 10.4 oz) 46.2 kg (101 lb 13.6 oz)    Intake/Output:   Intake/Output Summary (Last 24 hours) at 04/21/2018 0932 Last data filed at 04/20/2018 2100 Gross per 24 hour  Intake 720 ml  Output 800 ml  Net -80 ml      Physical Exam    General:  Thin lying in bed . No resp difficulty HEENT: normal Neck: supple. no JVD 6. Carotids 2+ bilat; no bruits. No lymphadenopathy or thryomegaly appreciated. Cor: PMI  nondisplaced. Regular rate & rhythm. No rubs, gallops or murmurs. Lungs: clear but decreased in R base Abdomen: soft, nontender, nondistended. No hepatosplenomegaly. No bruits or masses. Good bowel sounds. Extremities: no cyanosis, clubbing, rash, edema Neuro: alert & orientedx3, cranial nerves grossly intact. moves all 4 extremities w/o difficulty. Affect pleasant  Telemetry   NSR 90-100 Personally reviewed  EKG    N/a  Labs    CBC No results for input(s): WBC, NEUTROABS, HGB, HCT, MCV, PLT in the last 72 hours. Basic Metabolic Panel Recent Labs    04/19/18 1218 04/20/18 0500 04/21/18 0328  NA 140 141 137  K 4.0 3.6 4.8  CL 110 107 103  CO2 21* 27 27  GLUCOSE 158* 113* 199*  BUN 17 20 24*  CREATININE 0.83 0.88 0.87  CALCIUM 9.5 9.2 9.5  MG 1.9  --   --    Liver Function Tests Recent Labs    04/19/18 1218  AST 23  ALT 37  ALKPHOS 81  BILITOT 0.6  PROT 7.0  ALBUMIN 3.7   No results for input(s): LIPASE, AMYLASE in the last 72 hours. Cardiac Enzymes Recent Labs    04/19/18 1218  TROPONINI 0.04*    BNP: BNP (last 3 results) Recent Labs    02/03/18 0853 02/15/18 0449 04/19/18 1218  BNP 3,127.4* 2,526.7* 1,663.5*    ProBNP (last 3 results) No results for input(s):  PROBNP in the last 8760 hours.   D-Dimer No results for input(s): DDIMER in the last 72 hours. Hemoglobin A1C No results for input(s): HGBA1C in the last 72 hours. Fasting Lipid Panel No results for input(s): CHOL, HDL, LDLCALC, TRIG, CHOLHDL, LDLDIRECT in the last 72 hours. Thyroid Function Tests Recent Labs    04/19/18 1218 04/20/18 1806  TSH  --  0.766  T3FREE 2.4  --     Other results:   Imaging    Ct Chest W Contrast  Result Date: 04/21/2018 CLINICAL DATA:  Shortness of breath. History of small cell lung cancer, myocardial infarction, hypertension. EXAM: CT CHEST WITH CONTRAST TECHNIQUE: Multidetector CT imaging of the chest was performed during intravenous contrast  administration. CONTRAST:  72mL OMNIPAQUE IOHEXOL 300 MG/ML  SOLN COMPARISON:  Chest radiograph Apr 19, 2018 and CT chest March 27, 2018 FINDINGS: CARDIOVASCULAR: The heart is mildly enlarged and unchanged. No pericardial effusion. Status post median sternotomy for CABG. Stable anterior superiorly directed 17 mm contrast filled out patching from anterior ascending aorta terminating at surgical clips with possible nonenhancing component superiorly (sagittal image 51). Mild calcific atherosclerosis aortic arch, patent arch vessel origins. No central pulmonary embolism though not tailored for evaluation. MEDIASTINUM/NODES: RIGHT hilar and subcarinal lymphadenopathy contiguous RIGHT hilar consolidation. RIGHT PICC. LUNGS/PLEURA: Tracheobronchial tree is patent, no pneumothorax. Hypoenhancing wedge-like consolidation RIGHT middle lobe with enhancing RIGHT lower lobe atelectasis. Enlarging RIGHT perihilar amorphous consolidation. Increasing moderate RIGHT pleural effusion without abnormal pleural enhancement. Stable moderate centrilobular emphysema. UPPER ABDOMEN: Nonacute. MUSCULOSKELETAL: Nonacute. Subcentimeter RIGHT thyroid nodule below size followup recommendations. IMPRESSION: 1. Increasing moderate RIGHT pleural effusion, malignant etiology possible. 2. Increasing RIGHT hilar consolidation (differential diagnosis includes pneumonia, post treatment related changes or recurrent tumor). RIGHT middle lobe atelectasis versus pneumonia. 3. 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 Recommend cardiothoracic surgery consultation. Aortic Atherosclerosis (ICD10-I70.0) and Emphysema (ICD10-J43.9). Electronically Signed   By: Elon Alas M.D.   On: 04/21/2018 00:23     Medications:     Scheduled Medications: . aspirin EC  81 mg Oral Daily  . atorvastatin  80 mg Oral q1800  . Chlorhexidine Gluconate Cloth  6 each Topical Daily  . clopidogrel  75 mg Oral  Daily  . digoxin  0.0625 mg Oral Daily  . enoxaparin (LOVENOX) injection  40 mg Subcutaneous Q24H  . feeding supplement (ENSURE ENLIVE)  237 mL Oral TID BM  . ipratropium-albuterol  3 mL Nebulization BID  . ivabradine  5 mg Oral BID WC  . losartan  25 mg Oral Daily  . mouth rinse  15 mL Mouth Rinse BID  . predniSONE  50 mg Oral Q6H  . sodium chloride flush  10-40 mL Intracatheter Q12H  . spironolactone  12.5 mg Oral Daily    Infusions: . sodium chloride      PRN Medications: sodium chloride, acetaminophen, ondansetron (ZOFRAN) IV, oxyCODONE, sodium chloride flush    Patient Profile   Brittany [name] a 57 y/o woman with a history of PAF,previous smoker quit 4 years ago, hypertension,small cell lung cancer treated with chemo, chest XRT and prophylacticbrain radiation in 2015. Admitted 02/03/2018 with NSTEMI and shock. Underwent emergent cath 02/03/18 showed LAD 100% stenosed, LCx 95% stenosed. Taken for emergent CABG 02/03/18. Required impella post op. Post-op EF 25% with moderate AI.   Readmitted 5/10 with worsening SOB and concern for low ouput.   Assessment/Plan   1. Acute respiratory failure - remainssymptomatic though seems out of proportion  to objective findings - likely multifactorial CHF, COPD and R effusion/airspace disease with possible recurrent CA - initial concern was for low-output shock but co-ox ok and volume ok.  - PCT negative - Check CT chest with contrast done and reviewed with Dr. Prescott Gum. Concern for recurrent SCLC and small ascending aorta PSA - Will order R thoracentesis today with cytolocy  2. Acute on chronic systolic HF due to ICM - Echo 5/11 EF 15-20% RV moderateey down mild to moderate AI (Personally reviewed) - continue digoxin, losartan, ivabradine and spiro.  - no b-blocker yet with low output - volume status ok. Doubt this is major issues currently despite very low EF  3. SCLC - s/p treatment in 2015 (XRT and chemo)  but then lost  to f/u - CT chest with contrast done and reviewed with Dr. Prescott Gum. Concern for recurrent SCLC and small ascending aorta PSA - Will order R thoracentesis today with cytolocy  4. 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 Continue ASA, statin - no b-blocker yet with low output  5. Mild to moderate aortic insufficiency - stable  6. Anxiety  - prn xanax - add SSRI as toelrated   Length of Stay: 2  Glori Bickers, MD  04/21/2018, 9:32 AM  Advanced Heart Failure Team Pager [phone] (M-F; 7a - 4p)  Please contact [facility] for night-coverage after hours (4p -7a ) and weekends on [email or web address]

## 2018-04-22 ENCOUNTER — Encounter (HOSPITAL_COMMUNITY): Payer: Self-pay | Admitting: *Deleted

## 2018-04-22 DIAGNOSIS — J9601 Acute respiratory failure with hypoxia: Secondary | ICD-10-CM

## 2018-04-22 LAB — GLUCOSE, CAPILLARY
GLUCOSE-CAPILLARY: 141 mg/dL — AB (ref 65–99)
Glucose-Capillary: 291 mg/dL — ABNORMAL HIGH (ref 65–99)
Glucose-Capillary: 277 mg/dL — ABNORMAL HIGH (ref 65–99)
Glucose-Capillary: 163 mg/dL — ABNORMAL HIGH (ref 65–99)

## 2018-04-22 LAB — BASIC METABOLIC PANEL
ANION GAP: 8 (ref 5–15)
BUN: 34 mg/dL — ABNORMAL HIGH (ref 6–20)
CHLORIDE: 99 mmol/L — AB (ref 101–111)
CO2: 28 mmol/L (ref 22–32)
Calcium: 9.7 mg/dL (ref 8.9–10.3)
Creatinine, Ser: 0.94 mg/dL (ref 0.44–1.00)
GFR calc Af Amer: >60 mL/min (ref >60)
Glucose, Bld: 278 mg/dL — ABNORMAL HIGH (ref 65–99)
POTASSIUM: 5 mmol/L (ref 3.5–5.1)
SODIUM: 135 mmol/L (ref 135–145)

## 2018-04-22 LAB — HEMOGLOBIN A1C
HEMOGLOBIN A1C: 5.8 % — AB (ref 4.8–5.6)
Mean Plasma Glucose: 119.76 mg/dL

## 2018-04-22 MED ORDER — INSULIN ASPART 100 UNIT/ML ~~LOC~~ SOLN
0.0000 [IU] | Freq: Three times a day (TID) | SUBCUTANEOUS | Status: DC
  Administered 2018-04-22: 5 [IU] via SUBCUTANEOUS
  Administered 2018-04-22: 2 [IU] via SUBCUTANEOUS
  Administered 2018-04-22: 5 [IU] via SUBCUTANEOUS
  Administered 2018-04-23 – 2018-04-24 (×2): 2 [IU] via SUBCUTANEOUS
  Administered 2018-04-24 (×2): 1 [IU] via SUBCUTANEOUS
  Administered 2018-04-25: 2 [IU] via SUBCUTANEOUS
  Administered 2018-04-25: 1 [IU] via SUBCUTANEOUS

## 2018-04-22 MED ORDER — FUROSEMIDE 20 MG PO TABS
20.0000 mg | ORAL_TABLET | Freq: Every day | ORAL | Status: DC
  Administered 2018-04-22 – 2018-04-25 (×4): 20 mg via ORAL
  Filled 2018-04-22 (×4): qty 1

## 2018-04-22 MED ORDER — ENOXAPARIN SODIUM 30 MG/0.3ML ~~LOC~~ SOLN
30.0000 mg | SUBCUTANEOUS | Status: DC
  Administered 2018-04-22 – 2018-04-24 (×3): 30 mg via SUBCUTANEOUS
  Filled 2018-04-22 (×3): qty 0.3

## 2018-04-22 NOTE — Progress Notes (Signed)
Placed pt on BiPAP, pt tolerating well at this time. RT to monitor as needed.

## 2018-04-22 NOTE — Plan of Care (Signed)
Patient alert and oriented.  Denies shortness of breath, indicates some discomfort at the puncture site from previous procedure, and tenderness of the left chest upon palpation.  Declined pain medication at this time.  States that she will take something closer to bedtime so she may rest better.  VS remain stable.  Monitoring continues.  Transfer orders in place, but no beds available on 2C at this time.

## 2018-04-22 NOTE — Evaluation (Signed)
Physical Therapy Evaluation Patient Details Name: Brittany [name] MRN: [id number] DOB: 08-Nov-1961 Today's Date: 04/22/2018   History of Present Illness  57 yo admitted from HF clinic 5/10 with dyspnea and CHF, s/p thoracentesis 5/12. PMHx: emergent CAbG x3 2/24 with D/C to sNF then home, lung CA, HTN, PAF, CHF, asthma, anxiety  Clinical Impression  Pt agreeable to participate with therapy but self limiting with attempted mobility. Pt able to ambulate 100 ft with RW at a slow speed with reports of 8/10 fatigue. Objectively, pt vitals remained stable and gait quality did not decrease as ambulation progressed. Frequent cueing was required to ambulate with RW close to pt for upright posture. Pt reports a history of falling and demonstrates deficits related to generalized weakness and decreased level of mobility. Pt would benefit from acute PT to improve functional mobility level. PT currently recommends SNF due to decreased level of support in home environment and decreased functional mobility.     Follow Up Recommendations Supervision for mobility/OOB;SNF    Equipment Recommendations  3in1 (PT)    Recommendations for Other Services OT consult     Precautions / Restrictions Precautions Precautions: Fall Restrictions Weight Bearing Restrictions: No      Mobility  Bed Mobility Overal bed mobility: Modified Independent                Transfers Overall transfer level: Needs assistance   Transfers: Sit to/from Stand Sit to Stand: Supervision         General transfer comment: cues for RW placement and safety with sitting  Ambulation/Gait Ambulation/Gait assistance: Min guard Ambulation Distance (Feet): 100 Feet Assistive device: Rolling walker (2 wheeled) Gait Pattern/deviations: Step-through pattern;Decreased stride length   Gait velocity interpretation: <1.31 ft/sec, indicative of household ambulator General Gait Details: slow cautious gait with SpO2 94-100% on  2L throughout, cues to step into RW  Stairs            Wheelchair Mobility    Modified Rankin (Stroke Patients Only)       Balance Overall balance assessment: Needs assistance;History of Falls   Sitting balance-Leahy Scale: Good       Standing balance-Leahy Scale: Poor Standing balance comment: pt reliant on bil UE with standing and gait                             Pertinent Vitals/Pain Pain Assessment: 0-10 Pain Score: 5  Pain Location: chest and back Pain Descriptors / Indicators: Aching Pain Intervention(s): Limited activity within patient's tolerance;Monitored during session    Home Living Family/patient expects to be discharged to:: Private residence Living Arrangements: Other relatives Available Help at Discharge: Family;Available PRN/intermittently   Home Access: Stairs to enter   Entrance Stairs-Number of Steps: 1 Home Layout: Two level;1/2 bath on main level Home Equipment: Walker - 2 wheels Additional Comments: lives with sister, sleeps sitting up on sofa down stairs , has been sponge bathing    Prior Function Level of Independence: Independent               Hand Dominance        Extremity/Trunk Assessment   Upper Extremity Assessment Upper Extremity Assessment: Overall WFL for tasks assessed    Lower Extremity Assessment Lower Extremity Assessment: Overall WFL for tasks assessed    Cervical / Trunk Assessment Cervical / Trunk Assessment: Normal  Communication   Communication: No difficulties  Cognition Arousal/Alertness: Awake/alert Behavior During Therapy: WFL for tasks assessed/performed Overall  Cognitive Status: Within Functional Limits for tasks assessed                                        General Comments      Exercises General Exercises - Lower Extremity Ankle Circles/Pumps: AROM;10 reps;Seated;Both Long Arc Quad: AAROM;10 reps;Seated;Both   Assessment/Plan    PT Assessment Patient  needs continued PT services  PT Problem List Decreased strength;Decreased mobility;Decreased safety awareness;Decreased activity tolerance;Decreased knowledge of use of DME;Pain;Cardiopulmonary status limiting activity       PT Treatment Interventions Gait training;Therapeutic exercise;Patient/family education;Balance training;Functional mobility training;DME instruction;Therapeutic activities    PT Goals (Current goals can be found in the Care Plan section)  Acute Rehab PT Goals Patient Stated Goal: return home and do word games PT Goal Formulation: With patient Time For Goal Achievement: 05/06/18 Potential to Achieve Goals: Good    Frequency Min 3X/week   Barriers to discharge Decreased caregiver support      Co-evaluation               AM-PAC PT "6 Clicks" Daily Activity  Outcome Measure Difficulty turning over in bed (including adjusting bedclothes, sheets and blankets)?: None Difficulty moving from lying on back to sitting on the side of the bed? : None Difficulty sitting down on and standing up from a chair with arms (e.g., wheelchair, bedside commode, etc,.)?: A Little Help needed moving to and from a bed to chair (including a wheelchair)?: A Little Help needed walking in hospital room?: A Little Help needed climbing 3-5 steps with a railing? : A Little 6 Click Score: 20    End of Session Equipment Utilized During Treatment: Gait belt;Oxygen Activity Tolerance: Patient tolerated treatment well Patient left: in chair;with call bell/phone within reach Nurse Communication: Mobility status PT Visit Diagnosis: Muscle weakness (generalized) (M62.81);Other abnormalities of gait and mobility (R26.89)    Time: 1025-1102 PT Time Calculation (min) (ACUTE ONLY): 37 min   Charges:   PT Evaluation $PT Eval Moderate Complexity: 1 Mod PT Treatments $Gait Training: 8-22 mins   PT G Codes:        Elwyn Reach, PT [phone]   [name] 04/22/2018, 11:05  AM

## 2018-04-22 NOTE — Progress Notes (Signed)
RT placed patient on BIPAP tonight for patient comfort. Patient tolerating well at this time.

## 2018-04-22 NOTE — Progress Notes (Addendum)
Advanced Heart Failure Rounding Note  PCP-Cardiologist: No primary care provider on file.   Subjective:    Readmitted 5/10 with worsening SOB and concern for low ouput.   Feeling better. Continues on Bipap at night. +Orthopnea. No PND. No CP.   S/P R Thoracentesis 480 cc . Cytology pending.   Echo EF 15% RV moderately down mild to moderate AI. No effusion    Objective:   Weight Range: 101 lb 6.6 oz (46 kg) Body mass index is 20.14 kg/m.   Vital Signs:   Temp:  [98 F (36.7 C)-98.4 F (36.9 C)] 98 F (36.7 C) (05/13 0330) Pulse Rate:  [81-102] 84 (05/13 0600) Resp:  [11-25] 18 (05/13 0600) BP: (84-120)/(49-92) 103/71 (05/13 0600) SpO2:  [96 %-100 %] 99 % (05/13 0600) Weight:  [101 lb 6.6 oz (46 kg)] 101 lb 6.6 oz (46 kg) (05/13 0530) Last BM Date: (PTA)  Weight change: Filed Weights   04/20/18 0500 04/21/18 0600 04/22/18 0530  Weight: 99 lb 10.4 oz (45.2 kg) 101 lb 13.6 oz (46.2 kg) 101 lb 6.6 oz (46 kg)    Intake/Output:   Intake/Output Summary (Last 24 hours) at 04/22/2018 0711 Last data filed at 04/22/2018 0530 Gross per 24 hour  Intake 1570 ml  Output 900 ml  Net 670 ml      Physical Exam   CVP 7-  General:  Thin. No resp difficulty HEENT: normal anicteric Neck: supple. JVP 7-8   Carotids 2+ bilat; no bruits. No lymphadenopathy or thryomegaly appreciated. Cor: PMI nondisplaced. Regular rate & rhythm. No rubs, gallops or murmurs. Sternal scar ok Lungs: Rhonchi throughout on 4 liters  Abdomen: soft, nontender, nondistended. No hepatosplenomegaly. No bruits or masses. Good bowel sounds. Extremities: no cyanosis, clubbing, rash, edema RUE PICC Neuro: alert & oriented x 3, cranial nerves grossly intact. moves all 4 extremities w/o difficulty. Affect pleasant   Telemetry   NSR 90-100s   EKG    N/a  Labs    CBC No results for input(s): WBC, NEUTROABS, HGB, HCT, MCV, PLT in the last 72 hours. Basic Metabolic Panel Recent Labs     04/19/18 1218  04/21/18 0328 04/22/18 0358  NA 140   < > 137 135  K 4.0   < > 4.8 5.0  CL 110   < > 103 99*  CO2 21*   < > 27 28  GLUCOSE 158*   < > 199* 278*  BUN 17   < > 24* 34*  CREATININE 0.83   < > 0.87 0.94  CALCIUM 9.5   < > 9.5 9.7  MG 1.9  --   --   --    < > = values in this interval not displayed.   Liver Function Tests Recent Labs    04/19/18 1218  AST 23  ALT 37  ALKPHOS 81  BILITOT 0.6  PROT 7.0  ALBUMIN 3.7   No results for input(s): LIPASE, AMYLASE in the last 72 hours. Cardiac Enzymes Recent Labs    04/19/18 1218  TROPONINI 0.04*    BNP: BNP (last 3 results) Recent Labs    02/03/18 0853 02/15/18 0449 04/19/18 1218  BNP 3,127.4* 2,526.7* 1,663.5*    ProBNP (last 3 results) No results for input(s): PROBNP in the last 8760 hours.   D-Dimer No results for input(s): DDIMER in the last 72 hours. Hemoglobin A1C No results for input(s): HGBA1C in the last 72 hours. Fasting Lipid Panel No results for input(s): CHOL, HDL,  LDLCALC, TRIG, CHOLHDL, LDLDIRECT in the last 72 hours. Thyroid Function Tests Recent Labs    04/19/18 1218 04/20/18 1806  TSH  --  0.766  T3FREE 2.4  --     Other results:   Imaging    Dg Chest 1 View  Result Date: 04/21/2018 CLINICAL DATA:  Status post right thoracentesis and biopsy today. EXAM: CHEST  1 VIEW COMPARISON:  CT chest 04/20/2017. Single-view of the chest 04/19/2017. FINDINGS: Right pleural effusion is markedly decreased after thoracentesis. No pneumothorax. No left effusion. There is cardiomegaly. Right PICC is now in place with the tip projecting in the mid to lower superior vena cava. Collapse of the right middle lobe is unchanged. Lungs are emphysematous. IMPRESSION: Marked decrease in right pleural effusion after thoracentesis. Negative for pneumothorax or other new abnormality. No change in right middle lobe atelectasis. Emphysema. Cardiomegaly. Electronically Signed   By: Inge Rise M.D.   On:  04/21/2018 15:52     Medications:     Scheduled Medications: . aspirin EC  81 mg Oral Daily  . atorvastatin  80 mg Oral q1800  . Chlorhexidine Gluconate Cloth  6 each Topical Daily  . clopidogrel  75 mg Oral Daily  . digoxin  0.0625 mg Oral Daily  . enoxaparin (LOVENOX) injection  40 mg Subcutaneous Q24H  . feeding supplement (ENSURE ENLIVE)  237 mL Oral TID BM  . ipratropium-albuterol  3 mL Nebulization BID  . ivabradine  5 mg Oral BID WC  . losartan  25 mg Oral Daily  . mouth rinse  15 mL Mouth Rinse BID  . sodium chloride flush  10-40 mL Intracatheter Q12H  . spironolactone  12.5 mg Oral Daily    Infusions: . sodium chloride      PRN Medications: sodium chloride, acetaminophen, ondansetron (ZOFRAN) IV, oxyCODONE, sodium chloride flush    Patient Profile   Brittany [name] a 57 y/o woman with a history of PAF,previous smoker quit 4 years ago, hypertension,small cell lung cancer treated with chemo, chest XRT and prophylacticbrain radiation in 2015. Admitted 02/03/2018 with NSTEMI and shock. Underwent emergent cath 02/03/18 showed LAD 100% stenosed, LCx 95% stenosed. Taken for emergent CABG 02/03/18. Required impella post op. Post-op EF 25% with moderate AI.   Readmitted 5/10 with worsening SOB and concern for low ouput.   Assessment/Plan   1. Acute respiratory failure - remains symptomatic though seems out of proportion to objective findings - likely multifactorial CHF, COPD and R effusion/airspace disease with possible recurrent CA - initial concern was for low-output shock but co-ox ok and volume ok.  - PCT negative - CT chest with contrast done and reviewed with Dr. Prescott Gum. Concern for recurrent SCLC and small ascending aorta PSA - S/P R thoracentesis 5/12 480 cc with cytology  2. Acute on chronic systolic HF due to ICM - Echo 5/11 EF 15-20% RV moderateey down mild to moderate AI (Personally reviewed) -CVP 7-8. Volume status mildly elevated. Add 20 mg  po lasix daily.  - continue digoxin, losartan, ivabradine and spiro.  - no b-blocker yet with low output    3. SCLC - s/p treatment in 2015 (XRT and chemo)  but then lost to f/u - CT chest with contrast done and reviewed with Dr. Prescott Gum. Concern for recurrent SCLC and small ascending aorta PSA - S/P R Thoracentesis 5/12 with 480 cc removed.  Cytology pending.   4. 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 Continue ASA, statin - no b-blocker yet with  low output  5. Mild to moderate aortic insufficiency - stable  6. Anxiety  - prn xanax - add SSRI as tolerated  7. Elevated glucose- start sliding scale. Check hgb A1C  Consult PT today. Will need to work on home Bipap for discharge.    Length of Stay: 3  Brittany Clegg, NP  04/22/2018, 7:11 AM  Advanced Heart Failure Team Pager [phone] (M-F; [place])  Please contact [facility] for night-coverage after hours (4p -7a ) and weekends on [email or web address]  Patient seen and examined with Darrick Grinder, NP. We discussed all aspects of the encounter. I agree with the assessment and plan as stated above.   Symptomatically better but still quite weak. She is now s/p R thoracentesis. Await cytology to assess for recurrent SCLC. Volume status ok. Small aortic PSA reviewed with Dr. Prescott Gum. Will follow with serial CTs in several months.  Can go to SDU.   Glori Bickers, MD  8:42 AM  .

## 2018-04-22 NOTE — Discharge Instructions (Signed)
[facility] Stay Proper nutrition can help your body recover from illness and injury.   Foods and beverages high in protein, vitamins, and minerals help rebuild muscle loss, promote healing, & reduce fall risk.   In addition to eating healthy foods, a nutrition shake is an easy, delicious way to get the nutrition you need during and after your hospital stay  It is recommended that you continue to drink 2 bottles per day of:       Ensure plus/boost plus for at least 1 month (30 days) after your hospital stay   Tips for adding a nutrition shake into your routine: As allowed, drink one with vitamins or medications instead of water or juice Enjoy one as a tasty mid-morning or afternoon snack Drink cold or make a milkshake out of it Drink one instead of milk with cereal or snacks Use as a coffee creamer   Available at the following grocery stores and pharmacies:           * [place] [phone]            For COUPONS visit: [email or web address]/join or [email or web address]   Suggested Substitutions Ensure Plus = Boost Plus = Carnation Breakfast Essentials = Boost Compact Ensure Active Clear = Boost Breeze Glucerna Shake = Boost Glucose Control = Carnation Breakfast Essentials SUGAR FREE

## 2018-04-23 DIAGNOSIS — I5043 Acute on chronic combined systolic (congestive) and diastolic (congestive) heart failure: Secondary | ICD-10-CM

## 2018-04-23 LAB — BASIC METABOLIC PANEL
ANION GAP: 6 (ref 5–15)
BUN: 33 mg/dL — ABNORMAL HIGH (ref 6–20)
CHLORIDE: 101 mmol/L (ref 101–111)
CO2: 31 mmol/L (ref 22–32)
CREATININE: 1.01 mg/dL — AB (ref 0.44–1.00)
Calcium: 9.4 mg/dL (ref 8.9–10.3)
GFR calc non Af Amer: >60 mL/min (ref >60)
Glucose, Bld: 117 mg/dL — ABNORMAL HIGH (ref 65–99)
POTASSIUM: 4.6 mmol/L (ref 3.5–5.1)
SODIUM: 138 mmol/L (ref 135–145)

## 2018-04-23 LAB — GLUCOSE, CAPILLARY
GLUCOSE-CAPILLARY: 104 mg/dL — AB (ref 65–99)
Glucose-Capillary: 124 mg/dL — ABNORMAL HIGH (ref 65–99)
Glucose-Capillary: 105 mg/dL — ABNORMAL HIGH (ref 65–99)
Glucose-Capillary: 158 mg/dL — ABNORMAL HIGH (ref 65–99)
Glucose-Capillary: 189 mg/dL — ABNORMAL HIGH (ref 65–99)

## 2018-04-23 MED ORDER — LOSARTAN POTASSIUM 25 MG PO TABS
12.5000 mg | ORAL_TABLET | Freq: Every day | ORAL | Status: DC
  Administered 2018-04-23 – 2018-04-24 (×2): 12.5 mg via ORAL
  Filled 2018-04-23 (×2): qty 1

## 2018-04-23 MED ORDER — ALBUTEROL SULFATE (2.5 MG/3ML) 0.083% IN NEBU
2.5000 mg | INHALATION_SOLUTION | Freq: Four times a day (QID) | RESPIRATORY_TRACT | Status: DC | PRN
  Administered 2018-04-23: 2.5 mg via RESPIRATORY_TRACT

## 2018-04-23 MED ORDER — ALBUTEROL SULFATE (2.5 MG/3ML) 0.083% IN NEBU
INHALATION_SOLUTION | RESPIRATORY_TRACT | Status: AC
Start: 2018-04-23 — End: 2018-04-24
  Filled 2018-04-23: qty 3

## 2018-04-23 MED ORDER — IVABRADINE HCL 7.5 MG PO TABS
7.5000 mg | ORAL_TABLET | Freq: Two times a day (BID) | ORAL | Status: DC
  Administered 2018-04-23 – 2018-04-25 (×5): 7.5 mg via ORAL
  Filled 2018-04-23 (×7): qty 1

## 2018-04-23 NOTE — Progress Notes (Signed)
r Placed patient on BIPAP per patient request/comfort. No distress noted

## 2018-04-23 NOTE — Progress Notes (Addendum)
Advanced Heart Failure Rounding Note  PCP-Cardiologist: No primary care provider on file.   Subjective:    Readmitted 5/10 with worsening SOB and concern for low ouput.   Yesterday started on 20 mg po lasix.   Remains weak. Denies SOB   S/P R Thoracentesis 480 cc. Cytology pending.   Echo EF 15% RV moderately down mild to moderate AI. No effusion    Objective:   Weight Range: 103 lb 1.6 oz (46.8 kg) Body mass index is 20.48 kg/m.   Vital Signs:   Temp:  [97.6 F (36.4 C)-98.6 F (37 C)] 98 F (36.7 C) (05/14 0300) Pulse Rate:  [81-103] 87 (05/14 0500) Resp:  [9-23] 15 (05/14 0500) BP: (75-115)/(48-68) 83/51 (05/14 0500) SpO2:  [95 %-100 %] 100 % (05/14 0500) Weight:  [103 lb 1.6 oz (46.8 kg)] 103 lb 1.6 oz (46.8 kg) (05/14 0500) Last BM Date: 04/22/18  Weight change: Filed Weights   04/21/18 0600 04/22/18 0530 04/23/18 0500  Weight: 101 lb 13.6 oz (46.2 kg) 101 lb 6.6 oz (46 kg) 103 lb 1.6 oz (46.8 kg)    Intake/Output:   Intake/Output Summary (Last 24 hours) at 04/23/2018 0733 Last data filed at 04/23/2018 0500 Gross per 24 hour  Intake 750 ml  Output 675 ml  Net 75 ml      Physical Exam  CVP 7-8  General:  Thin  No resp difficulty HEENT: normal Neck: supple.  JVD 7-8. Carotids 2+ bilat; no bruits. No lymphadenopathy or thryomegaly appreciated. Cor: PMI nondisplaced. Regular rate & rhythm. No rubs, gallops or murmurs. Lungs: Rhonchi throughout on 2 liters oxygen.  Abdomen: soft, nontender, nondistended. No hepatosplenomegaly. No bruits or masses. Good bowel sounds. Extremities: no cyanosis, clubbing, rash, edema Neuro: alert & orientedx3, cranial nerves grossly intact. moves all 4 extremities w/o difficulty. Affect pleasant   Telemetry  NSR 80s personally reviewed.    EKG    N/a  Labs    CBC No results for input(s): WBC, NEUTROABS, HGB, HCT, MCV, PLT in the last 72 hours. Basic Metabolic Panel Recent Labs    04/22/18 0358  04/23/18 0457  NA 135 138  K 5.0 4.6  CL 99* 101  CO2 28 31  GLUCOSE 278* 117*  BUN 34* 33*  CREATININE 0.94 1.01*  CALCIUM 9.7 9.4   Liver Function Tests No results for input(s): AST, ALT, ALKPHOS, BILITOT, PROT, ALBUMIN in the last 72 hours. No results for input(s): LIPASE, AMYLASE in the last 72 hours. Cardiac Enzymes No results for input(s): CKTOTAL, CKMB, CKMBINDEX, TROPONINI in the last 72 hours.  BNP: BNP (last 3 results) Recent Labs    02/03/18 0853 02/15/18 0449 04/19/18 1218  BNP 3,127.4* 2,526.7* 1,663.5*    ProBNP (last 3 results) No results for input(s): PROBNP in the last 8760 hours.   D-Dimer No results for input(s): DDIMER in the last 72 hours. Hemoglobin A1C Recent Labs    04/22/18 0734  HGBA1C 5.8*   Fasting Lipid Panel No results for input(s): CHOL, HDL, LDLCALC, TRIG, CHOLHDL, LDLDIRECT in the last 72 hours. Thyroid Function Tests Recent Labs    04/20/18 1806  TSH 0.766    Other results:   Imaging    No results found.   Medications:     Scheduled Medications: . aspirin EC  81 mg Oral Daily  . atorvastatin  80 mg Oral q1800  . Chlorhexidine Gluconate Cloth  6 each Topical Daily  . clopidogrel  75 mg Oral Daily  .  digoxin  0.0625 mg Oral Daily  . enoxaparin (LOVENOX) injection  30 mg Subcutaneous Q24H  . feeding supplement (ENSURE ENLIVE)  237 mL Oral TID BM  . furosemide  20 mg Oral Daily  . insulin aspart  0-9 Units Subcutaneous TID WC  . ipratropium-albuterol  3 mL Nebulization BID  . ivabradine  5 mg Oral BID WC  . losartan  25 mg Oral Daily  . mouth rinse  15 mL Mouth Rinse BID  . sodium chloride flush  10-40 mL Intracatheter Q12H  . spironolactone  12.5 mg Oral Daily    Infusions: . sodium chloride      PRN Medications: sodium chloride, acetaminophen, ondansetron (ZOFRAN) IV, oxyCODONE, sodium chloride flush    Patient Profile   Brittany [name] a 57 y/o woman with a history of PAF,previous  smoker quit 4 years ago, hypertension,small cell lung cancer treated with chemo, chest XRT and prophylacticbrain radiation in 2015. Admitted 02/03/2018 with NSTEMI and shock. Underwent emergent cath 02/03/18 showed LAD 100% stenosed, LCx 95% stenosed. Taken for emergent CABG 02/03/18. Required impella post op. Post-op EF 25% with moderate AI.   Readmitted 5/10 with worsening SOB and concern for low ouput.   Assessment/Plan   1. Acute respiratory failure - remains symptomatic though seems out of proportion to objective findings - likely multifactorial CHF, COPD and R effusion/airspace disease with possible recurrent CA - initial concern was for low-output shock but co-ox ok and volume ok.  - PCT negative - CT chest with contrast done and reviewed with Dr. Prescott Gum. Concern for recurrent SCLC and small ascending aorta PSA - S/P R thoracentesis 5/12 480 cc with cytology  2. Acute on chronic systolic HF due to ICM - Echo 5/11 EF 15-20% RV moderateey down mild to moderate AI (Personally reviewed) CVP 7. Volume status stable.  - Increase ivabradine to 7.5 mg twice a day.  - SBP low. Switch losartan to 12.5 mg at bedtime  - continue digoxin -Continue 12.5 mg spiro daily - no b-blocker yet with low output - Renal function stable.    3. SCLC - s/p treatment in 2015 (XRT and chemo)  but then lost to f/u - CT chest with contrast done and reviewed with Dr. Prescott Gum. Concern for recurrent SCLC and small ascending aorta PSA. Will need repeat CT in 6 months.  - S/P R Thoracentesis 5/12 with 480 cc removed.  Cytology pending.   4. 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 Continue ASA, statin - no b-blocker yet with low output  5. Mild to moderate aortic insufficiency - stable  6. Anxiety  - prn xanax - add SSRI as tolerated  7. Elevated glucose- start sliding scale. A1C 5.8   PT recommending SNF. Consult SW.    Transfer to SDU.  Length of Stay: 4  Amy Clegg, NP  04/23/2018, 7:33  AM  Advanced Heart Failure Team Pager [phone] (M-F; 7a - 4p)  Please contact [facility] for night-coverage after hours (4p -7a ) and weekends on [email or web address]  Patient seen with NP, agree with the above note.  She is stable but weak.  SBP in 90s.  She is not volume overloaded on exam.  We have her back on her home meds.  CVP 7.  I do not think that she is infected, no fever.  However, I am concerned for recurrent lung cancer.  She has had a thoracentesis on right, awaiting cytology.  She will need followup with oncology.  PT recommends SNF, will start screening.  Think she can leave soon now that she is back on her meds.   Loralie Champagne 04/23/2018

## 2018-04-23 NOTE — Progress Notes (Signed)
Physical Therapy Treatment Patient Details Name: Brittany [name] MRN: [id number] DOB: [birth year] Today's Date: 04/23/2018    History of Present Illness 57 yo admitted from HF clinic 5/10 with dyspnea and CHF, s/p thoracentesis 5/12. PMHx: emergent CAbG x3 2/24 with D/C to sNF then home, lung CA, HTN, PAF, CHF, asthma, anxiety    PT Comments    Pt very frustrated with receiving breakfast food she doesn't eat because she's trying to gain weight, also frustrated with inability to sleep, and the news she received she may have to go to a "nursing home". Pt self limiting with frustration and anxiety regarding medical condition and current functional status. Pt functioning at min guard level however has difficulties with ADLs and higher level functional tasks. Acute PT to con't to follow.    Follow Up Recommendations  Supervision for mobility/OOB;SNF     Equipment Recommendations  3in1 (PT)    Recommendations for Other Services       Precautions / Restrictions Precautions Precautions: Fall Restrictions Weight Bearing Restrictions: No    Mobility  Bed Mobility Overal bed mobility: Modified Independent             General bed mobility comments: impulsively quick due to frustration, does not pay attention to lines  Transfers Overall transfer level: Needs assistance   Transfers: Sit to/from Stand Sit to Stand: Supervision         General transfer comment: supervision for safety for line management  Ambulation/Gait Ambulation/Gait assistance: Min guard Ambulation Distance (Feet): 150 Feet Assistive device: Rolling walker (2 wheeled) Gait Pattern/deviations: Step-through pattern;Decreased stride length Gait velocity: slow Gait velocity interpretation: <1.31 ft/sec, indicative of household ambulator General Gait Details: pt very agitated. required 4 standing rest breaks   Stairs             Wheelchair Mobility    Modified Rankin (Stroke Patients  Only)       Balance Overall balance assessment: Needs assistance Sitting-balance support: No upper extremity supported;Feet supported Sitting balance-Leahy Scale: Good       Standing balance-Leahy Scale: Fair Standing balance comment: pt okay statically without UE support but due to frustration and agitation requires RW for safe amb                            Cognition Arousal/Alertness: Awake/alert Behavior During Therapy: Agitated Overall Cognitive Status: Within Functional Limits for tasks assessed                                 General Comments: pt extremely frustrated regarding food she is getting for breakfast because she is trying to gain weight but they are serviing her food she doesn't like. She is also very upset she may have to go to a SNF due to minimal assist at home      Exercises      General Comments        Pertinent Vitals/Pain Pain Assessment: Faces Faces Pain Scale: Hurts a little bit Pain Location: chest/sternal incision Pain Descriptors / Indicators: Sore Pain Intervention(s): Limited activity within patient's tolerance    Home Living                      Prior Function            PT Goals (current goals can now be found in the care plan section) Acute Rehab PT  Goals Patient Stated Goal: return home and do word games Progress towards PT goals: Progressing toward goals    Frequency    Min 3X/week      PT Plan Current plan remains appropriate    Co-evaluation              AM-PAC PT "6 Clicks" Daily Activity  Outcome Measure  Difficulty turning over in bed (including adjusting bedclothes, sheets and blankets)?: None Difficulty moving from lying on back to sitting on the side of the bed? : None Difficulty sitting down on and standing up from a chair with arms (e.g., wheelchair, bedside commode, etc,.)?: A Little Help needed moving to and from a bed to chair (including a wheelchair)?: A  Little Help needed walking in hospital room?: A Little Help needed climbing 3-5 steps with a railing? : A Little 6 Click Score: 20    End of Session Equipment Utilized During Treatment: Gait belt;Oxygen Activity Tolerance: Patient tolerated treatment well Patient left: in bed;with call bell/phone within reach Nurse Communication: Mobility status PT Visit Diagnosis: Muscle weakness (generalized) (M62.81);Other abnormalities of gait and mobility (R26.89)     Time: 5456-2563 PT Time Calculation (min) (ACUTE ONLY): 42 min  Charges:  $Gait Training: 23-37 mins $Therapeutic Activity: 8-22 mins                    G Codes:       Kittie Plater, PT, DPT Pager #: [phone] Office #: [phone]    [place] 04/23/2018, 2:02 PM

## 2018-04-23 NOTE — Clinical Social Work Note (Signed)
Clinical Social Work Assessment  Patient Details  Name: Brittany [name] MRN: [id number] Date of Birth: 03/01/1961  Date of referral:  04/23/18               Reason for consult:  Facility Placement                Permission sought to share information with:  Facility Contact Representative Permission granted to share information::  Yes, Verbal Permission Granted  Name::        Agency::  SNF  Relationship::     Contact Information:     Housing/Transportation Living arrangements for the past 2 months:  Skilled Nursing Facility, Single Family Home Source of Information:  Patient Patient Interpreter Needed:  None Criminal Activity/Legal Involvement Pertinent to Current Situation/Hospitalization:  No - Comment as needed Significant Relationships:  Siblings Lives with:  Siblings Do you feel safe going back to the place where you live?  No Need for family participation in patient care:  No (Coment)  Care giving concerns:  Pt lives with sister at home but has been getting weaker since DC from SNF because she has been unable to obtain her medications due to lack of funds- states that since she did work for past 2 months due to medication problems couldn't afford medication copays (over $100 per patient).  Patient also now with increased weakness and unsure if she would be able to be at home successfully.   Social Worker assessment / plan:  CSW met with pt to discuss PT recommendation for SNF.  Pt was at hospital for a month followed by SNF for a month- familiar with SNF and what that would entail.   Employment status:  Disabled (Comment on whether or not currently receiving Disability)(has phone interview 05/09/18) Insurance information:  Managed Care PT Recommendations:  Skilled Nursing Facility Information / Referral to community resources:  Skilled Nursing Facility  Patient/Family's Response to care:  Pt willing to go back to SNF if she does not improve but hopeful that she will  feel better after a day or two more on medication and be safe to go home.  Patient/Family's Understanding of and Emotional Response to Diagnosis, Current Treatment, and Prognosis:  Patient seems to have good understanding of her medical condition and physician recommendations.  Emotional Assessment Appearance:  Appears stated age Attitude/Demeanor/Rapport:    Affect (typically observed):  Appropriate Orientation:  Oriented to Self, Oriented to Place, Oriented to  Time, Oriented to Situation Alcohol / Substance use:  Not Applicable Psych involvement (Current and /or in the community):  No (Comment)  Discharge Needs  Concerns to be addressed:  Care Coordination Readmission within the last 30 days:  Yes Current discharge risk:  Physical Impairment Barriers to Discharge:  Continued Medical Work up   ,  H, LCSW 04/23/2018, 2:54 PM  

## 2018-04-23 NOTE — NC FL2 (Signed)
[place] LEVEL OF CARE SCREENING TOOL     IDENTIFICATION  Patient Name: Brittany [name] Birthdate: 1961-06-09 Sex: female Admission Date (Current Location): 04/19/2018  [facility] and [place] Number:  Herbalist and Address:  [facility] [place], [place] [address] [place], [place] [address]      Provider Number: [id number]  Attending Physician Name and Address:  Jolaine Artist, MD  Relative Name and Phone Number:       Current Level of Care: Hospital Recommended Level of Care: [place] Prior Approval Number:    Date Approved/Denied:   PASRR Number: [id number]  Discharge Plan: SNF    Current Diagnoses: Patient Active Problem List   Diagnosis Date Noted  . Dyspnea 04/19/2018  . Acute respiratory failure with hypoxia ([place])   . STEMI involving left anterior descending coronary artery ([place]) 02/03/2018  . Cardiogenic shock ([place]) 02/03/2018  . Coronary artery disease involving native coronary artery of native heart with unstable angina pectoris ([place]) 02/03/2018  . Acute systolic congestive heart failure ([place]) 02/03/2018  . S/P CABG (coronary artery bypass graft) 02/03/2018  . Prophylactic measure   . Hypertension   . Asthma   . Anxiety   . Allergy   . CAP (community acquired pneumonia) 03/15/2015  . Chest pain 03/15/2015  . Pneumonia 03/15/2015  . Lung cancer ([place])   . Small cell lung cancer ([place]) 03/16/2014  . Protein-calorie malnutrition, severe ([place]) 03/14/2014  . Atrial fibrillation ([place]) 03/12/2014  . SVT (supraventricular tachycardia) ([place]) 03/11/2014  . Lung mass 03/10/2014  . HTN (hypertension) 05/07/2012  . Asthma 05/07/2012    Orientation RESPIRATION BLADDER Height & Weight     Self, Time, Situation, Place  Normal Incontinent, External catheter Weight: 103 lb 1.6 oz (46.8 kg) Height:     BEHAVIORAL SYMPTOMS/MOOD NEUROLOGICAL BOWEL NUTRITION STATUS      Continent Diet(2g sodium diet)   AMBULATORY STATUS COMMUNICATION OF NEEDS Skin   Limited Assist Verbally Normal                       Personal Care Assistance Level of Assistance  Bathing, Dressing Bathing Assistance: Limited assistance   Dressing Assistance: Limited assistance     Functional Limitations Info             SPECIAL CARE FACTORS FREQUENCY  PT (By licensed PT), OT (By licensed OT)     PT Frequency: 5/wk OT Frequency: 5/wk            Contractures      Additional Factors Info  Code Status, Allergies Code Status Info: FULL Allergies Info: Iodinated Diagnostic Agents, Latex, Pineapple, Sulfa Antibiotics, Codeine           Current Medications (04/23/2018):  This is the current hospital active medication list Current Facility-Administered Medications  Medication Dose Route Frequency Provider Last Rate Last Dose  . 0.9 %  sodium chloride infusion  250 mL Intravenous PRN Clegg, Amy D, NP      . acetaminophen (TYLENOL) tablet 650 mg  650 mg Oral Q4H PRN Clegg, Amy D, NP   650 mg at 04/19/18 2000  . aspirin EC tablet 81 mg  81 mg Oral Daily Clegg, Amy D, NP   81 mg at 04/23/18 0940  . atorvastatin (LIPITOR) tablet 80 mg  80 mg Oral q1800 Clegg, Amy D, NP   80 mg at 04/22/18 1652  . Chlorhexidine Gluconate Cloth 2 % PADS 6 each  6 each Topical Daily Clegg, Amy D, NP   6 each at 04/23/18 [phone]  . clopidogrel (PLAVIX) tablet 75 mg  75 mg Oral Daily Clegg, Amy D, NP   75 mg at 04/23/18 0940  . digoxin (LANOXIN) tablet 0.0625 mg  0.0625 mg Oral Daily Clegg, Amy D, NP   0.0625 mg at 04/23/18 0940  . enoxaparin (LOVENOX) injection 30 mg  30 mg Subcutaneous Q24H Clegg, Amy D, NP   30 mg at 04/22/18 2100  . feeding supplement (ENSURE ENLIVE) (ENSURE ENLIVE) liquid 237 mL  237 mL Oral TID BM Clegg, Amy D, NP   237 mL at 04/22/18 2048  . furosemide (LASIX) tablet 20 mg  20 mg Oral Daily Clegg, Amy D, NP   20 mg at 04/23/18 0941  . insulin aspart (novoLOG) injection 0-9 Units  0-9 Units Subcutaneous  TID WC Clegg, Amy D, NP   2 Units at 04/22/18 1652  . ipratropium-albuterol (DUONEB) 0.5-2.5 (3) MG/3ML nebulizer solution 3 mL  3 mL Nebulization BID Clegg, Amy D, NP   3 mL at 04/23/18 0810  . ivabradine (CORLANOR) tablet 7.5 mg  7.5 mg Oral BID WC Clegg, Amy D, NP   7.5 mg at 04/23/18 0940  . losartan (COZAAR) tablet 12.5 mg  12.5 mg Oral QHS Clegg, Amy D, NP      . MEDLINE mouth rinse  15 mL Mouth Rinse BID Clegg, Amy D, NP   15 mL at 04/22/18 2101  . ondansetron (ZOFRAN) injection 4 mg  4 mg Intravenous Q6H PRN Clegg, Amy D, NP      . oxyCODONE (Oxy IR/ROXICODONE) immediate release tablet 5 mg  5 mg Oral Q6H PRN Clegg, Amy D, NP   5 mg at 04/23/18 1131  . sodium chloride flush (NS) 0.9 % injection 10-40 mL  10-40 mL Intracatheter Q12H Clegg, Amy D, NP   20 mL at 04/23/18 0943  . sodium chloride flush (NS) 0.9 % injection 10-40 mL  10-40 mL Intracatheter PRN Clegg, Amy D, NP      . spironolactone (ALDACTONE) tablet 12.5 mg  12.5 mg Oral Daily Clegg, Amy D, NP   12.5 mg at 04/23/18 0940     Discharge Medications: Please see discharge summary for a list of discharge medications.  Relevant Imaging Results:  Relevant Lab Results:   Additional Information SS#: [id number]  Jorge Ny, LCSW

## 2018-04-24 ENCOUNTER — Other Ambulatory Visit: Payer: Self-pay

## 2018-04-24 ENCOUNTER — Encounter (HOSPITAL_COMMUNITY): Payer: Self-pay

## 2018-04-24 LAB — BASIC METABOLIC PANEL
ANION GAP: 8 (ref 5–15)
BUN: 29 mg/dL — ABNORMAL HIGH (ref 6–20)
CHLORIDE: 98 mmol/L — AB (ref 101–111)
CO2: 29 mmol/L (ref 22–32)
Calcium: 9.2 mg/dL (ref 8.9–10.3)
Creatinine, Ser: 0.98 mg/dL (ref 0.44–1.00)
GFR calc Af Amer: >60 mL/min (ref >60)
Glucose, Bld: 169 mg/dL — ABNORMAL HIGH (ref 65–99)
POTASSIUM: 4.5 mmol/L (ref 3.5–5.1)
SODIUM: 135 mmol/L (ref 135–145)

## 2018-04-24 LAB — GLUCOSE, CAPILLARY
GLUCOSE-CAPILLARY: 144 mg/dL — AB (ref 65–99)
GLUCOSE-CAPILLARY: 180 mg/dL — AB (ref 65–99)
GLUCOSE-CAPILLARY: 121 mg/dL — AB (ref 65–99)
GLUCOSE-CAPILLARY: 127 mg/dL — AB (ref 65–99)
Glucose-Capillary: 236 mg/dL — ABNORMAL HIGH (ref 65–99)

## 2018-04-24 NOTE — Progress Notes (Addendum)
Advanced Heart Failure Rounding Note  PCP-Cardiologist: No primary care provider on file.   Subjective:    Readmitted 5/10 with worsening SOB and concern for low ouput.   S/P R Thoracentesis 480 cc. Cytology pending.   Echo EF 15% RV moderately down mild to moderate AI. No effusion   Remains weak. PT recommending SNF. BP low. Losartan switched to qhs. Ivabradine increased. Using Bipap at night for comfort. SBP 80-90s  Cytology from pleural effusion still pending     Objective:   Weight Range: 46.2 kg (101 lb 14.4 oz) Body mass index is 20.24 kg/m.   Vital Signs:   Temp:  [98.3 F (36.8 C)-98.7 F (37.1 C)] 98.4 F (36.9 C) (05/15 0314) Pulse Rate:  [75-96] 76 (05/15 0314) Resp:  [10-18] 14 (05/15 0314) BP: (84-98)/(56-66) 88/59 (05/15 0314) SpO2:  [98 %-100 %] 100 % (05/15 0314) FiO2 (%):  [28 %] 28 % (05/14 2332) Weight:  [46.2 kg (101 lb 14.4 oz)] 46.2 kg (101 lb 14.4 oz) (05/15 0500) Last BM Date: 04/22/18  Weight change: Filed Weights   04/22/18 0530 04/23/18 0500 04/24/18 0500  Weight: 46 kg (101 lb 6.6 oz) 46.8 kg (103 lb 1.6 oz) 46.2 kg (101 lb 14.4 oz)    Intake/Output:   Intake/Output Summary (Last 24 hours) at 04/24/2018 0624 Last data filed at 04/24/2018 0400 Gross per 24 hour  Intake 500 ml  Output 1225 ml  Net -725 ml      Physical Exam  CVP 6-7 General:  Weak thin appearing. No resp difficulty HEENT: normal Neck: supple. no 6-7. Carotids 2+ bilat; no bruits. No lymphadenopathy or thryomegaly appreciated. Cor: PMI nondisplaced. Regular rate & rhythm. No rubs, gallops or murmurs. Lungs: decreased throughout with rhonchi  No wheeze  Abdomen: soft, nontender, nondistended. No hepatosplenomegaly. No bruits or masses. Good bowel sounds. Extremities: no cyanosis, clubbing, rash, edema Neuro: alert & orientedx3, cranial nerves grossly intact. moves all 4 extremities w/o difficulty. Affect pleasant   Telemetry  NSR 70-80s Personally  reviewe   EKG    N/a  Labs    CBC No results for input(s): WBC, NEUTROABS, HGB, HCT, MCV, PLT in the last 72 hours. Basic Metabolic Panel Recent Labs    04/23/18 0457 04/24/18 0405  NA 138 135  K 4.6 4.5  CL 101 98*  CO2 31 29  GLUCOSE 117* 169*  BUN 33* 29*  CREATININE 1.01* 0.98  CALCIUM 9.4 9.2   Liver Function Tests No results for input(s): AST, ALT, ALKPHOS, BILITOT, PROT, ALBUMIN in the last 72 hours. No results for input(s): LIPASE, AMYLASE in the last 72 hours. Cardiac Enzymes No results for input(s): CKTOTAL, CKMB, CKMBINDEX, TROPONINI in the last 72 hours.  BNP: BNP (last 3 results) Recent Labs    02/03/18 0853 02/15/18 0449 04/19/18 1218  BNP 3,127.4* 2,526.7* 1,663.5*    ProBNP (last 3 results) No results for input(s): PROBNP in the last 8760 hours.   D-Dimer No results for input(s): DDIMER in the last 72 hours. Hemoglobin A1C Recent Labs    04/22/18 0734  HGBA1C 5.8*   Fasting Lipid Panel No results for input(s): CHOL, HDL, LDLCALC, TRIG, CHOLHDL, LDLDIRECT in the last 72 hours. Thyroid Function Tests No results for input(s): TSH, T4TOTAL, T3FREE, THYROIDAB in the last 72 hours.  Invalid input(s): FREET3  Other results:   Imaging    No results found.   Medications:     Scheduled Medications: . albuterol      .  aspirin EC  81 mg Oral Daily  . atorvastatin  80 mg Oral q1800  . Chlorhexidine Gluconate Cloth  6 each Topical Daily  . clopidogrel  75 mg Oral Daily  . digoxin  0.0625 mg Oral Daily  . enoxaparin (LOVENOX) injection  30 mg Subcutaneous Q24H  . feeding supplement (ENSURE ENLIVE)  237 mL Oral TID BM  . furosemide  20 mg Oral Daily  . insulin aspart  0-9 Units Subcutaneous TID WC  . ipratropium-albuterol  3 mL Nebulization BID  . ivabradine  7.5 mg Oral BID WC  . losartan  12.5 mg Oral QHS  . mouth rinse  15 mL Mouth Rinse BID  . sodium chloride flush  10-40 mL Intracatheter Q12H  . spironolactone  12.5 mg Oral  Daily    Infusions: . sodium chloride      PRN Medications: sodium chloride, acetaminophen, albuterol, ondansetron (ZOFRAN) IV, oxyCODONE, sodium chloride flush    Patient Profile   Brittany [name] a 57 y/o woman with a history of PAF,previous smoker quit 4 years ago, hypertension,small cell lung cancer treated with chemo, chest XRT and prophylacticbrain radiation in 2015. Admitted 02/03/2018 with NSTEMI and shock. Underwent emergent cath 02/03/18 showed LAD 100% stenosed, LCx 95% stenosed. Taken for emergent CABG 02/03/18. Required impella post op. Post-op EF 25% with moderate AI.   Readmitted 5/10 with worsening SOB and concern for low ouput.   Assessment/Plan   1. Acute respiratory failure - remains symptomatic though seems out of proportion to objective findings - likely multifactorial CHF, COPD and R effusion/airspace disease with possible recurrent CA - initial concern was for low-output shock but co-ox ok and volume ok.  - PCT negative - CT chest with contrast done and reviewed with Dr. Prescott Gum. Concern for recurrent SCLC and small ascending aorta PSA - S/P R thoracentesis 5/12 480 cc with cytology - cytology still pending. I will call the lab today - using BIPAP at night for comfort but ABG does not qualify. - still with rhonchi. Will give IS   2. Acute on chronic systolic HF due to ICM - Echo 5/11 EF 15-20% RV moderateey down mild to moderate AI (Personally reviewed) - CVP 6-7. Volume status stable.  - Continue ivabradine to 7.5 mg twice a day.  - SBP remains low. Continue losartan 12.5 mg at bedtime for now - continue digoxin -Continue 12.5 mg spiro daily - no b-blocker yet with low BP  - Renal function stable.    3. SCLC - s/p treatment in 2015 (XRT and chemo)  but then lost to f/u - CT chest with contrast done and reviewed with Dr. Prescott Gum. Concern for recurrent SCLC and small ascending aorta PSA. Will need repeat CT in 6 months.  - S/P R  Thoracentesis 5/12 with 480 cc removed.  Cytology pending - I wil lcall lab today  4. 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 Continue ASA, statin - no b-blocker yet with low output  5. Mild to moderate aortic insufficiency - stable  6. Anxiety  - prn xanax - add SSRI as tolerated  7. Elevated glucose- start sliding scale. A1C 5.8   PT recommending SNF. Consult SW.  FL-2 signed. Likely stable for d/c to SNF today but would like to have cytology first.   Transfer to SDU.  Length of Stay: [place], MD  04/24/2018, 6:24 AM  Advanced Heart Failure Team Pager [phone] (M-F; 7a - 4p)  Please contact [facility] for  night-coverage after hours (4p -7a ) and weekends on [email or web address]

## 2018-04-24 NOTE — Progress Notes (Signed)
Clinical Social Worker following patient for support and discharge needs. CSW reached out to [facility] (SNF) to check on pending auth through insurance. Admission Coordinator Juliann Pulse stated at this time patient still does not have authorization through insurance to be able to discharge to facility.   Rhea Pink, MSW,  [place]

## 2018-04-24 NOTE — Progress Notes (Signed)
Nutrition Follow-up  DOCUMENTATION CODES:   Severe malnutrition in context of chronic illness  INTERVENTION:   -Continue Ensure Enlive po BID, each supplement provides 350 kcal and 20 grams of protein -Continue Magic Cup TID, each supplement provides 290 kcals and 9 grams protein -Continue MVI daily  NUTRITION DIAGNOSIS:   Severe Malnutrition related to chronic illness(CHF s/p CABG) as evidenced by energy intake < or equal to 75% for > or equal to 1 month, percent weight loss(15% weight loss in 4 months).  Ongoing  GOAL:   Patient will meet greater than or equal to 90% of their needs  Progressing  MONITOR:   PO intake, Supplement acceptance, Skin, Weight trends, Labs, I & O's  REASON FOR ASSESSMENT:   Malnutrition Screening Tool    ASSESSMENT:   57 y.o. F admitted on 5/10 for HF follow-up. Pt with PMH of small cell lung cancer s/p chemotherapy and radiation (2016), HTN, CHF. Last admit 02/03/18 pt with cardiogenic shock in setting of STEMI requiring emergent CABG with Impella placement. During hospital stay pt had various complications including HCAP, A fib, respiratory failure, and swallowing issues. Pt discharged to SNF. Pt diagnosed 02/13/18 with severe malnutrition in the context of acute illness as evidenced by energy intake < or equal to 50% for > or equal to 5 days, moderate muscle depletion, mild fat depletion; developed over 02/03/18 admission.   5/12- s/p rt thoracentesis (yelded 480 ml) 5/15- trasnferred from ICU to PCU  Reviewed I/O's: -725 ml x 24 hours and -80 ml since admission  Pt sleepy and not very talkative during interview. She reports that her appetite is improving and is taking Ensure supplements, which she likes. Meal completion 50-75%. She mainly has her eyes closed during assessment, but allowed this RD to complete nutrition-focused physical exam.   Suspect edema may be masking further weight loss and fat and muscle depletions.   Per RNCM and CSW  notes, working on potential SNF placement.   Labs reviewed: CBGS: 105-189 (inpatient orders for glycemic control are 0-9 units insuln aspart TID with meals).   NUTRITION - FOCUSED PHYSICAL EXAM:    Most Recent Value  Orbital Region  Moderate depletion  Upper Arm Region  Moderate depletion  Thoracic and Lumbar Region  Unable to assess  Buccal Region  No depletion  Temple Region  No depletion  Clavicle Bone Region  Mild depletion  Clavicle and Acromion Bone Region  Unable to assess  Scapular Bone Region  Mild depletion  Dorsal Hand  Mild depletion  Patellar Region  Severe depletion  Anterior Thigh Region  Moderate depletion  Posterior Calf Region  Moderate depletion  Edema (RD Assessment)  Mild  Hair  Reviewed  Eyes  Reviewed  Mouth  Reviewed  Skin  Reviewed  Nails  Reviewed       Diet Order:   Diet Order           Diet 2 gram sodium Room service appropriate? Yes; Fluid consistency: Thin  Diet effective now          EDUCATION NEEDS:   Education needs have been addressed  Skin:  Skin Assessment: Reviewed RN Assessment  Last BM:  04/22/18  Height:   Ht Readings from Last 1 Encounters:  04/23/18 4' 11.5" (1.511 m)    Weight:   Wt Readings from Last 1 Encounters:  04/24/18 101 lb 14.4 oz (46.2 kg)    Ideal Body Weight:  71.59 kg  BMI:  Body mass index is 20.24  kg/m.  Estimated Nutritional Needs:   Kcal:  1500-1700 kcal  Protein:  75-90 grams  Fluid:  >/= 1.4 L or per MD    Brittany [name] A. Brittany [name], RD, LDN, CDE Pager: [phone] After hours Pager: [phone]

## 2018-04-24 NOTE — Discharge Summary (Addendum)
Advanced Heart Failure Team  Discharge Summary   Patient ID: Brittany [name] MRN: [id number], DOB/AGE: 13-Feb-1961 57 y.o. Admit date: 04/19/2018 D/C date:     04/25/2018   Primary Discharge Diagnoses:  1. Acute hypoxemic respiratory failure  Continue 2 oxygen continuously 2. Acute on chronic systolic HF due to ICM - Echo 5/11 EF 15-20% RV moderateey down mild to moderate AI (Personally reviewed) 3. SCLC - s/p treatment in 2015 (XRT and chemo)  but then lost to f/u.  - CT chest with contrast done and reviewed with Dr. Prescott Gum. Will need repeat CT in 6 months.  - S/P R Thoracentesis 5/12 with 480 cc removed.  Cytology- negative  4. CAD s/p MI and CABG 2/19 5. Mild to moderate aortic insufficiency 6. Anxiety 7. Severe protein-calorie malnutrition (evaluated by dietician)   Hospital Course:  Brittany [name] a 57 y/o woman with a history of PAF,previous smoker quit 4 years ago, hypertension,small cell lung cancer treated with chemo, chest XRT and prophylacticbrain radiation in 2015. Admitted 02/03/2018 with NSTEMI and shock. Underwent emergent cath 02/03/18 showed LAD 100% stenosed, LCx 95% stenosed. Taken for emergent CABG 02/03/18. Required impella post op. Post-op EF 25% with moderate AI.   Readmitted from the [facility] on 5/10 with acute respiratory failure and concern for low output heart failure. Prior to admit she had been off all medication since she was discharged from the SNF due to inability to pay for medications. Acutely short of breath on admit and required short term Bipap. ABG was ok so she continued on Bipap but later weaned off. CXR showed moderate R pleural effusion that required thoracentesis. Cytology was negative for malignancy. She will need another CT scan of her chest in 6 months.   HF medications gradually restarted and she improved. She will need to continue 2 liters of oxygen. PT evaluated and recommended SNF so SW consulted. She will be  discharged to  [facility].  Once she is discharged from SNF she will need close follow up in the HF clinic. She has had difficulty paying for medications even with insurance. SNF will need to make sure she has medications prior to discharge.   ECHO 04/20/2018  Left ventricle: Basal function preserved remainder of ventrical   serverely hypokinetic The cavity size was severely dilated. Wall   thickness was normal. The estimated ejection fraction was 20%.   Left ventricular diastolic function parameters were normal. - Aortic valve: There was mild to moderate regurgitation. - Mitral valve: There was mild regurgitation. - Atrial septum: No defect or patent foramen ovale was identified.   Discharge Weight: 101 pounds Discharge Vitals: Blood pressure (!) 92/58, pulse 87, temperature 98.7 F (37.1 C), temperature source Oral, resp. rate 16, height 4' 11.5" (1.511 m), weight 101 lb 10.1 oz (46.1 kg), SpO2 97 %.  Labs: Lab Results  Component Value Date   WBC 9.0 03/14/2018   HGB 9.1 (L) 03/14/2018   HCT 28.7 (L) 03/14/2018   MCV 90 03/14/2018   PLT 402 (H) 03/14/2018    Recent Labs  Lab 04/19/18 1218  04/25/18 0833  NA 140   < > 138  K 4.0   < > 4.7  CL 110   < > 101  CO2 21*   < > 30  BUN 17   < > 28*  CREATININE 0.83   < > 0.85  CALCIUM 9.5   < > 9.2  PROT 7.0  --   --  BILITOT 0.6  --   --   ALKPHOS 81  --   --   ALT 37  --   --   AST 23  --   --   GLUCOSE 158*   < > 125*   < > = values in this interval not displayed.   Lab Results  Component Value Date   CHOL 279 (H) 01/01/2014   HDL 49 01/01/2014   LDLCALC 208 (H) 01/01/2014   TRIG 109 01/01/2014   BNP (last 3 results) Recent Labs    02/03/18 0853 02/15/18 0449 04/19/18 1218  BNP 3,127.4* 2,526.7* 1,663.5*    ProBNP (last 3 results) No results for input(s): PROBNP in the last 8760 hours.   Diagnostic Studies/Procedures   No results found.  Discharge Medications   Allergies as of  04/25/2018      Reactions   Iodinated Diagnostic Agents Hives, Rash   Needs 13-hour prep before CT scans w/ contrast.  Can do Benadryl 50mg  PO for ESI's, etc   Latex Hives, Other (See Comments)   Skin irritation   Pineapple Hives   Sulfa Antibiotics Hives, Rash   Codeine Nausea And Vomiting      Medication List    TAKE these medications   aspirin 81 MG EC tablet Take 1 tablet (81 mg total) by mouth daily.   atorvastatin 80 MG tablet Commonly known as:  LIPITOR Take 1 tablet (80 mg total) by mouth daily at 6 PM.   clopidogrel 75 MG tablet Commonly known as:  PLAVIX Take 1 tablet (75 mg total) by mouth daily.   Digoxin 62.5 MCG Tabs Take 0.0625 mg by mouth daily.   docusate sodium 100 MG capsule Commonly known as:  COLACE Take 1 capsule (100 mg total) by mouth 2 (two) times daily as needed for mild constipation.   furosemide 40 MG tablet Commonly known as:  LASIX Take 0.5 tablets (20 mg total) by mouth daily. What changed:  how much to take   ipratropium-albuterol 0.5-2.5 (3) MG/3ML Soln Commonly known as:  DUONEB Take 3 mLs by nebulization 2 (two) times daily.   ivabradine 7.5 MG Tabs tablet Commonly known as:  CORLANOR Take 1 tablet (7.5 mg total) by mouth 2 (two) times daily with a meal. What changed:    medication strength  how much to take   losartan 25 MG tablet Commonly known as:  COZAAR Take 0.5 tablets (12.5 mg total) by mouth daily. What changed:  how much to take   oxyCODONE 5 MG immediate release tablet Commonly known as:  Oxy IR/ROXICODONE Take 1-2 tablets (5-10 mg total) by mouth every 6 (six) hours as needed for severe pain.   spironolactone 25 MG tablet Commonly known as:  ALDACTONE Take 0.5 tablets (12.5 mg total) by mouth daily.       Disposition   The patient will be discharged in stable condition to home. Discharge Instructions    (HEART FAILURE PATIENTS) Call MD:  Anytime you have any of the following symptoms: 1) 3 pound weight  gain in 24 hours or 5 pounds in 1 week 2) shortness of breath, with or without a dry hacking cough 3) swelling in the hands, feet or stomach 4) if you have to sleep on extra pillows at night in order to breathe.   Complete by:  As directed    (HEART FAILURE PATIENTS) Call MD:  Anytime you have any of the following symptoms: 1) 3 pound weight gain in 24 hours or 5  pounds in 1 week 2) shortness of breath, with or without a dry hacking cough 3) swelling in the hands, feet or stomach 4) if you have to sleep on extra pillows at night in order to breathe.   Complete by:  As directed    Diet - low sodium heart healthy   Complete by:  As directed    Diet - low sodium heart healthy   Complete by:  As directed    Heart Failure patients record your daily weight using the same scale at the same time of day   Complete by:  As directed    Heart Failure patients record your daily weight using the same scale at the same time of day   Complete by:  As directed    Increase activity slowly   Complete by:  As directed    Increase activity slowly   Complete by:  As directed      Follow-up Information    Clegg, Amy D, NP Follow up on 05/02/2018.   Specialty:  Cardiology Why:  Heart Failure Follow Up - 10:30 - Parking Garage Code: [id number].  Take all am meds, bring all med bottles with you. Contact information: [address] [address] [phone]             Duration of Discharge Encounter: Greater than 35 minutes   Signed, Darrick Grinder NP-C  04/25/2018, 10:54 AM  Patient seen and examined with Darrick Grinder, NP. We discussed all aspects of the encounter. I agree with the assessment and plan as stated above.   She is stable for d/c to SNF. No definitive evidence of SCLC recurrence. Will need f/u with Oncology. HF stabilized.   Glori Bickers, MD  11:17 PM  .

## 2018-04-24 NOTE — Care Management Note (Signed)
Case Management Note  Patient Details  Name: Brittany [name] MRN: [id number] Date of Birth: 04-Dec-1961  Subjective/Objective:   Pt admitted with dsypnea - pt very recently discharged from SNF to home                 Action/Plan:  PTA from home with sister.      Expected Discharge Date:                  Expected Discharge Plan:  Skilled Nursing Facility  In-House Referral:  Clinical Social Work  Discharge planning Services  CM Consult  Post Acute Care Choice:    Choice offered to:     DME Arranged:    DME Agency:     HH Arranged:    [place] Agency:     Status of Service:  In process, will continue to follow  If discussed at Long Length of Stay Meetings, dates discussed:    Additional Comments: Pt was recently discharged from [facility] with [facility] (pt is unsure of [facility] agency name).  Pt is on home oxygen 2 liters however unsure of DME agency - pt states "somebody" will bring portable tank to hospital for discharge if necessary.  Pt informed CM that she has not been able to afford medication copays since discharge from SNF.  Pt stated "I cant afford any of them, I don't have a dollar to put on my medicines".  CM attempted to discuss medication assistance with pt however pt declined.  CM also attempted to discuss discharge planning regarding home with home health if SNF was denied, pt also declined to discuss and asked CM to leave the room.  CM attempted to explain to pt that an assessment is required to determine what the actual needs of the pt are so assistance can be offered - pt again requested CM to exit room.  CM contacted attending team and informed of event - HF team will assist pt with medications.  [name], RN 04/24/2018, 3:17 PM

## 2018-04-25 ENCOUNTER — Inpatient Hospital Stay: Payer: BLUE CROSS/BLUE SHIELD | Attending: Internal Medicine | Admitting: Internal Medicine

## 2018-04-25 DIAGNOSIS — R0603 Acute respiratory distress: Secondary | ICD-10-CM

## 2018-04-25 DIAGNOSIS — I5022 Chronic systolic (congestive) heart failure: Secondary | ICD-10-CM

## 2018-04-25 LAB — BASIC METABOLIC PANEL
Anion gap: 7 (ref 5–15)
BUN: 28 mg/dL — ABNORMAL HIGH (ref 6–20)
CHLORIDE: 101 mmol/L (ref 101–111)
CO2: 30 mmol/L (ref 22–32)
Calcium: 9.2 mg/dL (ref 8.9–10.3)
Creatinine, Ser: 0.85 mg/dL (ref 0.44–1.00)
GFR calc non Af Amer: >60 mL/min (ref >60)
Glucose, Bld: 125 mg/dL — ABNORMAL HIGH (ref 65–99)
POTASSIUM: 4.7 mmol/L (ref 3.5–5.1)
SODIUM: 138 mmol/L (ref 135–145)

## 2018-04-25 LAB — GLUCOSE, CAPILLARY
GLUCOSE-CAPILLARY: 162 mg/dL — AB (ref 65–99)
Glucose-Capillary: 137 mg/dL — ABNORMAL HIGH (ref 65–99)

## 2018-04-25 MED ORDER — LOSARTAN POTASSIUM 25 MG PO TABS: 12.5000 mg | ORAL_TABLET | Freq: Every day | ORAL | 6 refills | Status: DC

## 2018-04-25 MED ORDER — FUROSEMIDE 40 MG PO TABS: 20.0000 mg | ORAL_TABLET | Freq: Every day | ORAL | 6 refills | Status: DC

## 2018-04-25 MED ORDER — IVABRADINE HCL 7.5 MG PO TABS: 7.5000 mg | ORAL_TABLET | Freq: Two times a day (BID) | ORAL | 6 refills | Status: DC

## 2018-04-25 MED ORDER — OXYCODONE HCL 5 MG PO TABS: 5.0000 mg | ORAL_TABLET | Freq: Two times a day (BID) | ORAL | 0 refills | Status: DC | PRN

## 2018-04-25 NOTE — Progress Notes (Signed)
Patient has been referred for HF [employer].  I have sent all appropriate paperwork via secure email to paramedic team.

## 2018-04-25 NOTE — Progress Notes (Signed)
Patient will DC to: [facility] Anticipated DC date: 04/25/18 Family notified: Patient alerting family Transport by: [facility] 2pm  Please send signed pain script with patient.  Per MD patient ready for DC to [facility]. RN, patient, patient's family, and facility notified of DC. Discharge Summary sent to facility. RN given number for report [phone] Room 228a). DC packet on chart. Ambulance transport requested for patient.   CSW signing off.  [name], LCSW Clinical Social Worker [phone]

## 2018-04-25 NOTE — Progress Notes (Addendum)
Advanced Heart Failure Rounding Note  PCP-Cardiologist: No primary care provider on file.   Subjective:    Readmitted 5/10 with worsening SOB and concern for low ouput.   S/P R Thoracentesis 480 cc. Cytology negative for malignancy.    Echo EF 15% RV moderately down mild to moderate AI. No effusion   Complaining of congestion. Denies SOB. Able to sleep on her back. Feels weak. No orthopnea or PND.    Objective:   Weight Range: 101 lb 10.1 oz (46.1 kg) Body mass index is 20.18 kg/m.   Vital Signs:   Temp:  [98.3 F (36.8 C)-99.1 F (37.3 C)] 98.7 F (37.1 C) (05/16 0700) Pulse Rate:  [71-85] 81 (05/16 0700) Resp:  [13-18] 16 (05/16 0700) BP: (88-101)/(54-60) 92/58 (05/16 0700) SpO2:  [98 %-100 %] 100 % (05/16 0700) FiO2 (%):  [28 %] 28 % (05/16 0000) Weight:  [101 lb 10.1 oz (46.1 kg)] 101 lb 10.1 oz (46.1 kg) (05/16 0356) Last BM Date: 04/24/18  Weight change: Filed Weights   04/23/18 0500 04/24/18 0500 04/25/18 0356  Weight: 103 lb 1.6 oz (46.8 kg) 101 lb 14.4 oz (46.2 kg) 101 lb 10.1 oz (46.1 kg)    Intake/Output:   Intake/Output Summary (Last 24 hours) at 04/25/2018 0811 Last data filed at 04/25/2018 0100 Gross per 24 hour  Intake 490 ml  Output 850 ml  Net -360 ml      Physical Exam   General:  Thin No resp difficulty HEENT: normal anicteric Neck: supple. no JVD. Carotids 2+ bilat; no bruits. No lymphadenopathy or thryomegaly appreciated. Cor: PMI nondisplaced. Regular rate & rhythm. No rubs, gallops or murmurs. Lungs: Rhonchi throughout. On 2 liters oxygen.  Abdomen: soft, nontender, nondistended. No hepatosplenomegaly. No bruits or masses. Good bowel sounds. Extremities: no cyanosis, clubbing, rash, edema. RUE PICC Neuro: alert & oriented x 3, cranial nerves grossly intact. moves all 4 extremities w/o difficulty. Affect pleasant   Telemetry  NSR 70-80s personally reviewed.    EKG    N/a  Labs    CBC No results for input(s): WBC,  NEUTROABS, HGB, HCT, MCV, PLT in the last 72 hours. Basic Metabolic Panel Recent Labs    04/23/18 0457 04/24/18 0405  NA 138 135  K 4.6 4.5  CL 101 98*  CO2 31 29  GLUCOSE 117* 169*  BUN 33* 29*  CREATININE 1.01* 0.98  CALCIUM 9.4 9.2   Liver Function Tests No results for input(s): AST, ALT, ALKPHOS, BILITOT, PROT, ALBUMIN in the last 72 hours. No results for input(s): LIPASE, AMYLASE in the last 72 hours. Cardiac Enzymes No results for input(s): CKTOTAL, CKMB, CKMBINDEX, TROPONINI in the last 72 hours.  BNP: BNP (last 3 results) Recent Labs    02/03/18 0853 02/15/18 0449 04/19/18 1218  BNP 3,127.4* 2,526.7* 1,663.5*    ProBNP (last 3 results) No results for input(s): PROBNP in the last 8760 hours.   D-Dimer No results for input(s): DDIMER in the last 72 hours. Hemoglobin A1C No results for input(s): HGBA1C in the last 72 hours. Fasting Lipid Panel No results for input(s): CHOL, HDL, LDLCALC, TRIG, CHOLHDL, LDLDIRECT in the last 72 hours. Thyroid Function Tests No results for input(s): TSH, T4TOTAL, T3FREE, THYROIDAB in the last 72 hours.  Invalid input(s): FREET3  Other results:   Imaging    No results found.   Medications:     Scheduled Medications: . aspirin EC  81 mg Oral Daily  . atorvastatin  80 mg Oral q1800  .  clopidogrel  75 mg Oral Daily  . digoxin  0.0625 mg Oral Daily  . enoxaparin (LOVENOX) injection  30 mg Subcutaneous Q24H  . feeding supplement (ENSURE ENLIVE)  237 mL Oral TID BM  . furosemide  20 mg Oral Daily  . insulin aspart  0-9 Units Subcutaneous TID WC  . ipratropium-albuterol  3 mL Nebulization BID  . ivabradine  7.5 mg Oral BID WC  . losartan  12.5 mg Oral QHS  . mouth rinse  15 mL Mouth Rinse BID  . sodium chloride flush  10-40 mL Intracatheter Q12H  . spironolactone  12.5 mg Oral Daily    Infusions: . sodium chloride      PRN Medications: sodium chloride, acetaminophen, albuterol, ondansetron (ZOFRAN) IV,  oxyCODONE, sodium chloride flush    Patient Profile   Brittany [name] a 57 y/o woman with a history of PAF,previous smoker quit 4 years ago, hypertension,small cell lung cancer treated with chemo, chest XRT and prophylacticbrain radiation in 2015. Admitted 02/03/2018 with NSTEMI and shock. Underwent emergent cath 02/03/18 showed LAD 100% stenosed, LCx 95% stenosed. Taken for emergent CABG 02/03/18. Required impella post op. Post-op EF 25% with moderate AI.   Readmitted 5/10 with worsening SOB and concern for low ouput.   Assessment/Plan   1. Acute respiratory failure - Resolved. Continue 2 liters oxygen.  - likely multifactorial CHF, COPD and R effusion/airspace disease with possible recurrent CA - initial concern was for low-output shock but co-ox ok and volume ok.  - PCT negative - CT chest with contrast done and reviewed with Dr. Prescott Gum. Concern for recurrent SCLC and small ascending aorta PSA - S/P R thoracentesis 5/12 480 cc with cytology - cytology still pending. I will call the lab today  2. Acute on chronic systolic HF due to ICM - Echo 5/11 EF 15-20% RV moderateey down mild to moderate AI (Personally reviewed) - SBP soft. Continue ivabradine to 7.5 mg twice a day.  - Continue losartan 12.5 mg at bedtime for now - continue digoxin -Continue 12.5 mg spiro daily - no b-blocker yet with low BP     3. SCLC - s/p treatment in 2015 (XRT and chemo)  but then lost to f/u - CT chest with contrast done and reviewed with Dr. Prescott Gum. Concern for recurrent SCLC and small ascending aorta PSA. There is no PSA -> it is stump from Impella graft.  - S/P R Thoracentesis 5/12 with 480 cc removed. -   Cytology negative for malignancy.   4. 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 Continue ASA, statin - no b-blocker yet with low output  5. Mild to moderate aortic insufficiency - stable  6. Anxiety  - prn xanax - add SSRI as tolerated  7. Elevated glucose- start sliding  scale. A1C 5.8   Discharge to SNF today.   Length of Stay: 6  Amy Clegg, NP  04/25/2018, 8:11 AM  Advanced Heart Failure Team Pager [phone] (M-F; [place])  Please contact [facility] for night-coverage after hours (4p -7a ) and weekends on [email or web address]  Patient seen and examined with Darrick Grinder, NP. We discussed all aspects of the encounter. I agree with the assessment and plan as stated above.   She remains weak but stable. Volume status ok. Effusion cytology negative for malignancy. Will d/c to SNF today. Will need f/u with Oncology.   Glori Bickers, MD  9:32 AM

## 2018-04-25 NOTE — Care Management Note (Signed)
Case Management Note  Patient Details  Name: Brittany [name] MRN: [id number] Date of Birth: 12-25-1960  Subjective/Objective:   Pt admitted with dsypnea - pt very recently discharged from SNF to home                 Action/Plan:  PTA from home with sister.      Expected Discharge Date:                  Expected Discharge Plan:  Skilled Nursing Facility  In-House Referral:  Clinical Social Work  Discharge planning Services  CM Consult  Post Acute Care Choice:    Choice offered to:     DME Arranged:    DME Agency:     HH Arranged:    [place] Agency:     Status of Service:  In process, will continue to follow  If discussed at Long Length of Stay Meetings, dates discussed:    Additional Comments: 04/25/2018 Discussed in LOS - CSW AD approved LOG for SNF placement pending insurance auth approval.  CSW following for placement.  04/24/18 Pt was recently discharged from [facility] with [facility] (pt is unsure of [facility] agency name).  Pt is on home oxygen 2 liters however unsure of DME agency - pt states "somebody" will bring portable tank to hospital for discharge if necessary.  Pt informed CM that she has not been able to afford medication copays since discharge from SNF.  Pt stated "I cant afford any of them, I don't have a dollar to put on my medicines".  CM attempted to discuss medication assistance with pt however pt declined.  CM also attempted to discuss discharge planning regarding home with home health if SNF was denied, pt also declined to discuss and asked CM to leave the room.  CM attempted to explain to pt that an assessment is required to determine what the actual needs of the pt are so assistance can be offered - pt again requested CM to exit room.  CM contacted attending team and informed of event - HF team will assist pt with medications.  [name], RN 04/25/2018, 9:32 AM

## 2018-04-25 NOTE — Progress Notes (Signed)
Removed PICC on Rt. Arm. Pt understood that she is going to [facility]. Gave a report to [facility] Nurse Alinda Sierras. Pt will be transfer at 2 pm.

## 2018-04-25 NOTE — Progress Notes (Signed)
Pt took her all belongings and Pt left to [facility] via stretcher. HS [employer]

## 2018-04-25 NOTE — Progress Notes (Signed)
Physical Therapy Treatment Patient Details Name: Brittany [name] MRN: [id number] DOB: 1961/11/27 Today's Date: 04/25/2018    History of Present Illness 57 yo admitted from HF clinic 5/10 with dyspnea and CHF, s/p thoracentesis 5/12. PMHx: emergent CAbG x3 2/24 with D/C to sNF then home, lung CA, HTN, PAF, CHF, asthma, anxiety    PT Comments    Pt somewhat anxious upon arrival but willing to participate with PT. Pt expressed anxiety regarding D/C placement. Pt found at 100% spO2 at rest. Ambulated 150 feet on room air with RW at decreased gait speed and reported LE and bilateral calf fatigue. Session also focused on standing TherEx to increase time in standing while monitoring SpO2%. Numbers remained >95% throughout session. Pt demonstrates decreased ability to initiate movement and reluctance to perform ADLs such as toileting. Pt also requires cueing for safe technique and RW placement during transfers.  Follow Up Recommendations  Supervision for mobility/OOB;SNF     Equipment Recommendations  3in1 (PT)    Recommendations for Other Services       Precautions / Restrictions Precautions Precautions: Fall Restrictions Weight Bearing Restrictions: No    Mobility  Bed Mobility Overal bed mobility: Modified Independent                Transfers Overall transfer level: Needs assistance   Transfers: Sit to/from Stand Sit to Stand: Supervision         General transfer comment: supervision for safety for line management  Ambulation/Gait Ambulation/Gait assistance: Min guard Ambulation Distance (Feet): 150 Feet Assistive device: Rolling walker (2 wheeled) Gait Pattern/deviations: Step-through pattern;Decreased stride length   Gait velocity interpretation: <1.31 ft/sec, indicative of household ambulator General Gait Details: slow steady gait able to maintain SpO2 97% on RA, cues for posture and increased stride   Stairs             Wheelchair Mobility     Modified Rankin (Stroke Patients Only)       Balance Overall balance assessment: Needs assistance   Sitting balance-Leahy Scale: Good       Standing balance-Leahy Scale: Fair                              Cognition Arousal/Alertness: Awake/alert Behavior During Therapy: Flat affect Overall Cognitive Status: Within Functional Limits for tasks assessed                                 General Comments: frustrated with cold food, short responses with report of general frustration with not being back to work and still being attached to cords      Exercises Total Joint Exercises Standing Hip Extension: AROM;10 reps;Standing;Both General Exercises - Lower Extremity Heel Raises: AROM;10 reps;Standing;Both Mini-Sqauts: AROM;10 reps;Standing;Both    General Comments        Pertinent Vitals/Pain Pain Score: 3  Pain Location: bil calves Pain Descriptors / Indicators: Sore Pain Intervention(s): Limited activity within patient's tolerance    Home Living                      Prior Function            PT Goals (current goals can now be found in the care plan section) Progress towards PT goals: Progressing toward goals    Frequency           PT Plan Current plan remains  appropriate    Co-evaluation              AM-PAC PT "6 Clicks" Daily Activity  Outcome Measure  Difficulty turning over in bed (including adjusting bedclothes, sheets and blankets)?: None Difficulty moving from lying on back to sitting on the side of the bed? : None Difficulty sitting down on and standing up from a chair with arms (e.g., wheelchair, bedside commode, etc,.)?: A Little Help needed moving to and from a bed to chair (including a wheelchair)?: A Little Help needed walking in hospital room?: A Little Help needed climbing 3-5 steps with a railing? : A Little 6 Click Score: 20    End of Session Equipment Utilized During Treatment: Gait  belt;Oxygen Activity Tolerance: Patient tolerated treatment well Patient left: with call bell/phone within reach;Other (comment)(pt refused getting up to chair. in bathroom with RN outside door and aware) Nurse Communication: Mobility status PT Visit Diagnosis: Muscle weakness (generalized) (M62.81);Other abnormalities of gait and mobility (R26.89)     Time: 9292-4462 PT Time Calculation (min) (ACUTE ONLY): 25 min  Charges:  $Gait Training: 8-22 mins $Therapeutic Exercise: 8-22 mins                    G Codes:       Gabe Bethannie Iglehart, SPT   [employer] 04/25/2018, 11:57 AM

## 2018-04-25 NOTE — Clinical Social Work Placement (Signed)
   CLINICAL SOCIAL WORK PLACEMENT  NOTE  Date:  04/25/2018  Patient Details  Name: Brittany [name] MRN: [id number] Date of Birth: 11/13/1961  Clinical Social Work is seeking post-discharge placement for this patient at the [place] level of care (*CSW will initial, date and re-position this form in  chart as items are completed):  Yes   Patient/family provided with [place] Work Department's list of facilities offering this level of care within the geographic area requested by the patient (or if unable, by the patient's family).  Yes   Patient/family informed of their freedom to choose among providers that offer the needed level of care, that participate in Medicare, Medicaid or managed care program needed by the patient, have an available bed and are willing to accept the patient.  Yes   Patient/family informed of 's ownership interest in [facility] and [facility], as well as of the fact that they are under no obligation to receive care at these facilities.  PASRR submitted to EDS on       PASRR number received on       Existing PASRR number confirmed on 04/23/18     FL2 transmitted to all facilities in geographic area requested by pt/family on 04/23/18     FL2 transmitted to all facilities within larger geographic area on       Patient informed that his/her managed care company has contracts with or will negotiate with certain facilities, including the following:        Yes   Patient/family informed of bed offers received.  Patient chooses bed at [facility]     Physician recommends and patient chooses bed at      Patient to be transferred to [facility] on 04/25/18.  Patient to be transferred to facility by [facility]     Patient family notified on 04/25/18 of transfer.  Name of family member notified:  Patient notifying family     PHYSICIAN Please sign FL2     Additional Comment:     _______________________________________________ Benard Halsted, [place] 04/25/2018, 12:37 PM

## 2018-04-25 NOTE — Progress Notes (Signed)
[employer] [facility] received insurance approval for patient to admit today.  Brittany [name] [name] LCSW [phone]

## 2018-04-25 NOTE — Plan of Care (Signed)
Continue with plan of care.  

## 2018-04-26 ENCOUNTER — Other Ambulatory Visit (HOSPITAL_COMMUNITY): Payer: Self-pay

## 2018-04-26 LAB — CULTURE, BODY FLUID W GRAM STAIN -BOTTLE: Culture: NO GROWTH

## 2018-04-26 MED FILL — SPIRONOLACTONE 25 MG TABLET: 25 | 34 days supply | Qty: 17 | Fill #0

## 2018-04-26 MED FILL — LOSARTAN POTASSIUM 25 MG TA: 25 | 34 days supply | Qty: 17 | Fill #0

## 2018-04-26 MED FILL — FUROSEMIDE 20 MG TABS: 20 | 34 days supply | Qty: 34 | Fill #0

## 2018-04-26 MED FILL — DIGOXIN 0.125 MG TABLET: 125 | 34 days supply | Qty: 17 | Fill #0

## 2018-04-26 NOTE — Progress Notes (Signed)
CHP visit to [place], due to report of pt leaving AMA without her medications.  Pt stated that she's still at the facility and she can't leave without her medications.  [facility] meds provided through the heart failure fund.  Upon my arrival the nursing staff stated that pt can not have her medications in her room due to them providing the medications at the facility.  I advised pt of the same and she agrees to stay at the facility "at least until Tuesday".  I will f/u with pt and the heart failure clinic Tuesday.

## 2018-04-29 ENCOUNTER — Telehealth (HOSPITAL_COMMUNITY): Payer: Self-pay | Admitting: Surgery

## 2018-04-29 NOTE — Telephone Encounter (Signed)
Attempted to call patient to check on her status with no answer and no ability to leave message.  She called HF clinic on Friday saying that she planned to leave [facility].  She asked clinic staff to assist her with "getting her medications".  All current prescriptions were sent to the [facility] pharmacy to be filled through the [place].  [facility] then proceeded to pick up medications in order to deliver to patient.  Upon arriving at [facility] was unable to leave medications with patient and patient informed [name] that she would remain there until at least Tuesday so that we could ensure that she had all appropriate measures in place before returning to home.  [name] is to follow-up in AM regarding patient status.

## 2018-05-01 NOTE — Progress Notes (Signed)
PCP: Cardiologist: Dr Marlou Porch  Primary HF Cardiologist: Dr [name] CT Surgeon: Dr Roxy Manns Oncologist: Dr [name]   HPI: Brittany [name] a 57 y/o woman with a history of PAF,preveious smoker quit 4 years ago, hypertension, 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   Admitted 02/03/2018 with NSTEMI and shock. Underwent emergent cath 02/03/18 showed LAD 100% stenosed, LCx 95% stenosed. Taken for emergent CABG 02/03/18. Required impella post op. Hospital course complicated by cardiogenic shock, HCAP, A fib, respiratory failure, and swallowing issues. She was discharged to SNF. Discharge weight 103 pounds.   Admitted 5/10-5/16/19 from HF clinic with acute respiratory failure and concern for low output HF. She had been off medications PTA due to inability to afford them after leaving SNF. She required BiPAP on admit. CXR showed R pleural effusion. Had thoracentesis with cytology negative for malignancy. Co-ox was 65%. HF meds gradually added back. Required O2 at discharge. Discharged to SNF. DC weight: 101 lbs.   Today she returns for post hospital follow up. She is still at [facility]. Plans to DC in 1 week and will then go live with her sister with HF paramedicine following. Overall she is doing okay. Over the last 3 days, she has had a nonproductive cough and a little more SOB. No fever or chills. She is SOB after walking down the hallway with PT. Baseline orthopnea unchanged. Denies edema. She had an episode of heartburn 2 days ago, which was her CP equivalent. It occurred after PT and lasted 10-15 minutes. She has ongoing fatigue. She has ongoing dizziness at rest that is at baseline. She will be followed by HF paramedicine once DC'd from SNF. [name] has picked up her medications for when she is ready for discharge. Weights stable 101 lbs. Taking all medications.   CT of chest 1. No  definite findings of locally recurrent tumor in the right lung. 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 Continued chest CT surveillance is advised in 3-6 months. 2. No thoracic adenopathy or other findings of metastatic disease in the chest. 3. Small to moderate dependent right pleural effusion, increased. 4. Stable mild cardiomegaly. 5. Mild patchy ground-glass opacities in both lungs are decreased in the interval, favor improved mild residual pulmonary edema. 6. New complete right middle lobe atelectasis.  ECHO 04/20/2018  Left ventricle: Basal function preserved remainder of ventrical serverely hypokinetic The cavity size was severely dilated. Wall thickness was normal. The estimated ejection fraction was 20%. Left ventricular diastolic function parameters were normal. - Aortic valve: There was mild to moderate regurgitation. - Mitral valve: There was mild regurgitation. - Atrial septum: No defect or patent foramen ovale was identified.  ECHO  03/31/2018  Left ventricle: Basal inferior and anterior function preserved   remainder of ventrical serverely hypokinetic with septal and   apical akinesis The cavity size was severely dilated. Wall   thickness was normal. The estimated ejection fraction was 25%.   Doppler parameters are consistent with both elevated ventricular   end-diastolic filling pressure and elevated left atrial filling   pressure. - Aortic valve: There was moderate regurgitation. - Mitral valve: There was mild regurgitation. - Left atrium: The atrium was mildly dilated. - Atrial septum: No defect or patent foramen ovale was identified.  ECHO 02/11/2018  - Left ventricle: The cavity size was normal. There was mild   concentric hypertrophy. Systolic function was  severely reduced.   The estimated ejection fraction was in the range of 25% to 30%.   There is akinesis of the mid-apical  anterior, lateral,   inferolateral, inferior, apical septal and apical myocardium.   There is severe hypokinesis of the basalanteroseptal and   inferoseptal myocardium. Features are consistent with a   pseudonormal left ventricular filling pattern, with concomitant   abnormal relaxation and increased filling pressure (grade 2   diastolic dysfunction). Doppler parameters are consistent with   high ventricular filling pressure. - Aortic valve: There was severe regurgitation by pressure halftime   of 133 msec but visually only appears moderate. - Mitral valve: Calcified annulus. There was trivial regurgitation.   LHC 02/03/2018    Prox RCA to Mid RCA lesion is 25% stenosed.  Ost Ramus lesion is 70% stenosed.  Ost Cx to Prox Cx lesion is 50% stenosed.  Ost LAD to Prox LAD lesion is 100% stenosed.  LV end diastolic pressure is severely elevated.  Hemodynamic findings consistent with severe pulmonary hypertension.  Post intervention, there is a 100% residual stenosis.  Review of systems complete and found to be negative unless listed in HPI.   SH:  Social History   Socioeconomic History  . Marital status: Married    Spouse name: Not on file  . Number of children: Not on file  . Years of education: Not on file  . Highest education level: Not on file  Occupational History  . Not on file  Social Needs  . Financial resource strain: Not on file  . Food insecurity:    Worry: Not on file    Inability: Not on file  . Transportation needs:    Medical: Not on file    Non-medical: Not on file  Tobacco Use  . Smoking status: Former Smoker    Packs/day: 1.00    Years: 20.00    Pack years: 20.00    Types: Cigarettes    Last attempt to quit: 03/13/2014    Years since quitting: 4.1  . Smokeless tobacco: Never Used  Substance and Sexual Activity  . Alcohol use: Yes    Alcohol/week: 4.8 oz    Types: 8 Glasses of wine per week  . Drug use: No  . Sexual activity: Never  Lifestyle   . Physical activity:    Days per week: Not on file    Minutes per session: Not on file  . Stress: Not on file  Relationships  . Social connections:    Talks on phone: Not on file    Gets together: Not on file    Attends religious service: Not on file    Active member of club or organization: Not on file    Attends meetings of clubs or organizations: Not on file    Relationship status: Not on file  . Intimate partner violence:    Fear of current or ex partner: Not on file    Emotionally abused: Not on file    Physically abused: Not on file    Forced sexual activity: Not on file  Other Topics Concern  . Not on file  Social History Narrative  . Not on file    FH:  Family History  Problem Relation Age of Onset  . Heart disease Mother   . Hypertension Mother   . Heart attack Mother   . Hypertension Maternal Grandmother   . Cancer Maternal Grandmother   . Diabetes Paternal Grandmother   . Stroke Neg Hx  Past Medical History:  Diagnosis Date  . Acute systolic congestive heart failure ([place]) 02/03/2018  . Allergy   . Anxiety   . Asthma   . Hypertension   . PAF (paroxysmal atrial fibrillation) ([place])   . Prophylactic measure 08/03/14-08/19/14   Prophyl. cranial radiation 24 Gy  . 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 Small cell lung cancer ([place]) 03/16/2014    Current Outpatient Medications  Medication Sig Dispense Refill  . aspirin EC 81 MG EC tablet Take 1 tablet (81 mg total) by mouth daily.    Marland Kitchen atorvastatin (LIPITOR) 80 MG tablet Take 1 tablet (80 mg total) by mouth daily at 6 PM.    . clopidogrel (PLAVIX) 75 MG tablet Take 1 tablet (75 mg total) by mouth daily.    . digoxin 62.5 MCG TABS Take 0.0625 mg by mouth daily.    Marland Kitchen docusate sodium (COLACE) 100 MG capsule Take 1 capsule (100 mg total) by mouth 2 (two) times daily as needed for mild  constipation. 60 capsule 3  . furosemide (LASIX) 40 MG tablet Take 0.5 tablets (20 mg total) by mouth daily. 30 tablet 6  . ipratropium-albuterol (DUONEB) 0.5-2.5 (3) MG/3ML SOLN Take 3 mLs by nebulization 2 (two) times daily. 360 mL   . ivabradine (CORLANOR) 7.5 MG TABS tablet Take 1 tablet (7.5 mg total) by mouth 2 (two) times daily with a meal. 60 tablet 6  . losartan (COZAAR) 25 MG tablet Take 0.5 tablets (12.5 mg total) by mouth daily. 30 tablet 6  . oxyCODONE (OXY IR/ROXICODONE) 5 MG immediate release tablet Take 1-2 tablets (5-10 mg total) by mouth every 12 (twelve) hours as needed for severe pain. 30 tablet 0  . spironolactone (ALDACTONE) 25 MG tablet Take 0.5 tablets (12.5 mg total) by mouth daily. 45 tablet 3   No current facility-administered medications for this encounter.     Vitals:   05/02/18 1048  BP: 100/74  Pulse: 94  SpO2: 100%  Weight: 101 lb 12.8 oz (46.2 kg)   Wt Readings from Last 3 Encounters:  05/02/18 101 lb 12.8 oz (46.2 kg)  04/25/18 101 lb 10.1 oz (46.1 kg)  04/19/18 99 lb (44.9 kg)   Orthostatics: Sitting: 98/60 Standing: 110/78  PHYSICAL EXAM: General: Thin.  Arrived in wheelchair HEENT: Normal Neck: Supple. JVP flat. Carotids 2+ bilat; no bruits. No thyromegaly or nodule noted. Cor: PMI nondisplaced. RRR, No M/G/R noted Lungs: coarse throughout.  Abdomen: Soft, non-tender, non-distended, no HSM. No bruits or masses. +BS  Extremities: No cyanosis, clubbing, or rash. R and LLE no edema.  Neuro: Alert & orientedx3, cranial nerves grossly intact. moves all 4 extremities w/o difficulty. Affect pleasant   ASSESSMENT & PLAN: 1. Chronic Systolic HF  H/O cardiogenic shock in setting of #1: TEE 02/03/18 LVEF 20%, Mild to Mod AR, Mild/Mod MR, RV normal. Impella out 3/1.  Echo 3/4 EF 25-30% with moderate-severe aortic insufficiency which is likely contributing to CHF. Echo 04/2018: EF 20%, mild to mod AI, mild MR - NYHA III - Volume status stable on exam.  Orthostatics negative.  - No BB with recent low output.  - Continue digoxin 0.0625 mg daily - Continue spiro 12.5 mg daily - Continue losartan 12.5 mg daily - Continue corlanor 7.5 mg BID. Samples provided - Will be followed by HF paramedicine once discharge from SNF  2. Hypoxic respiratory failure - Has O2 PRN.  3. 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  Cath with severe 2v CAD as above with severe LV dysfunction/ICM. s/p Emergent CABG 02/03/18.   - She had an episode of heartburn that lasted 10-15 minutes (heartburn was her only CP symptoms). Has only occurred once. No other s/s ischemia. Instructed her to call if symptoms reoccur or worsen.  - Continue plavix, asa, atorvastatin.    4. PAF:  CHA2DS2/VASc is at least 4. (CHF, Vasc disease, HTN, Female).   - She has history of Afib RVR in the past in the setting of her Lung CA. Previously on Xarelto but stopped her medications.  - Will continue ASA and Plavix. No anticoagulation with h/o lung cancer. - Regular on exam today  5. 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  - Chest CT 02/19/18 with mass-like consolidation in R hilum concerning for recurrent tumor.  -Repeat CT 03/27/2018 -No definite findings of locally recurrent tumor in the right lung. 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 Continued chest CT surveillance is advised in 3-6 months. - She will then follow up with Dr Julien Nordmann.   - S/p thoracentesis 04/2018. Cytology negative for malignancy. Needs repeat CT in 6 months  6. Anemia:  - Stable 9.1 on 03/14/2018   7. Aortic insufficiency: Moderate-severe on postop echo.  Likely potentiating CHF.   - Mild to moderate on Echo 04/20/18.   8. Dysphagia - Swallow study with concerns for RLN palsy during recent hospitaliazation. Resolved  BMET today Follow up in 2 weeks  Georgiana Shore, NP 12:43 PM  Greater than 50% of  the 25 minute visit was spent in counseling/coordination of care regarding disease state education, salt/fluid restriction, sliding scale diuretics, and medication compliance.

## 2018-05-02 ENCOUNTER — Ambulatory Visit (HOSPITAL_COMMUNITY)
Admit: 2018-05-02 | Discharge: 2018-05-02 | Disposition: A | Discharge status: Home / Self Care | Payer: BLUE CROSS/BLUE SHIELD | Attending: Cardiology | Admitting: Cardiology

## 2018-05-02 ENCOUNTER — Encounter (HOSPITAL_COMMUNITY): Payer: Self-pay

## 2018-05-02 ENCOUNTER — Other Ambulatory Visit (HOSPITAL_COMMUNITY): Payer: Self-pay

## 2018-05-02 VITALS — BP 100/74 | HR 94 | Wt 101.8 lb

## 2018-05-02 DIAGNOSIS — I252 Old myocardial infarction: Secondary | ICD-10-CM | POA: Diagnosis not present

## 2018-05-02 DIAGNOSIS — J9691 Respiratory failure, unspecified with hypoxia: Secondary | ICD-10-CM | POA: Diagnosis present

## 2018-05-02 DIAGNOSIS — Z87891 Personal history of nicotine dependence: Secondary | ICD-10-CM | POA: Diagnosis not present

## 2018-05-02 DIAGNOSIS — Z951 Presence of aortocoronary bypass graft: Secondary | ICD-10-CM | POA: Diagnosis not present

## 2018-05-02 DIAGNOSIS — R1310 Dysphagia, unspecified: Secondary | ICD-10-CM | POA: Insufficient documentation

## 2018-05-02 DIAGNOSIS — Z7902 Long term (current) use of antithrombotics/antiplatelets: Secondary | ICD-10-CM | POA: Diagnosis not present

## 2018-05-02 DIAGNOSIS — Z8249 Family history of ischemic heart disease and other diseases of the circulatory system: Secondary | ICD-10-CM | POA: Insufficient documentation

## 2018-05-02 DIAGNOSIS — J45909 Unspecified asthma, uncomplicated: Secondary | ICD-10-CM | POA: Insufficient documentation

## 2018-05-02 DIAGNOSIS — I110 Hypertensive heart disease with heart failure: Secondary | ICD-10-CM | POA: Insufficient documentation

## 2018-05-02 DIAGNOSIS — R0609 Other forms of dyspnea: Secondary | ICD-10-CM

## 2018-05-02 DIAGNOSIS — Z7901 Long term (current) use of anticoagulants: Secondary | ICD-10-CM | POA: Diagnosis not present

## 2018-05-02 DIAGNOSIS — I447 Left bundle-branch block, unspecified: Secondary | ICD-10-CM | POA: Diagnosis not present

## 2018-05-02 DIAGNOSIS — Z7982 Long term (current) use of aspirin: Secondary | ICD-10-CM | POA: Insufficient documentation

## 2018-05-02 DIAGNOSIS — I480 Paroxysmal atrial fibrillation: Secondary | ICD-10-CM

## 2018-05-02 DIAGNOSIS — Z79899 Other long term (current) drug therapy: Secondary | ICD-10-CM | POA: Diagnosis not present

## 2018-05-02 DIAGNOSIS — F419 Anxiety disorder, unspecified: Secondary | ICD-10-CM | POA: Insufficient documentation

## 2018-05-02 DIAGNOSIS — I2510 Atherosclerotic heart disease of native coronary artery without angina pectoris: Secondary | ICD-10-CM | POA: Diagnosis not present

## 2018-05-02 DIAGNOSIS — I351 Nonrheumatic aortic (valve) insufficiency: Secondary | ICD-10-CM | POA: Diagnosis not present

## 2018-05-02 DIAGNOSIS — I25810 Atherosclerosis of coronary artery bypass graft(s) without angina pectoris: Secondary | ICD-10-CM | POA: Diagnosis not present

## 2018-05-02 DIAGNOSIS — I5022 Chronic systolic (congestive) heart failure: Secondary | ICD-10-CM | POA: Insufficient documentation

## 2018-05-02 DIAGNOSIS — Z8709 Personal history of other diseases of the respiratory system: Secondary | ICD-10-CM

## 2018-05-02 DIAGNOSIS — R42 Dizziness and giddiness: Secondary | ICD-10-CM | POA: Insufficient documentation

## 2018-05-02 DIAGNOSIS — Z85118 Personal history of other malignant neoplasm of bronchus and lung: Secondary | ICD-10-CM | POA: Diagnosis not present

## 2018-05-02 DIAGNOSIS — D649 Anemia, unspecified: Secondary | ICD-10-CM | POA: Diagnosis not present

## 2018-05-02 LAB — BASIC METABOLIC PANEL
Anion gap: 8 (ref 5–15)
BUN: 18 mg/dL (ref 6–20)
CO2: 25 mmol/L (ref 22–32)
CREATININE: 0.86 mg/dL (ref 0.44–1.00)
Calcium: 9.7 mg/dL (ref 8.9–10.3)
Chloride: 108 mmol/L (ref 101–111)
GFR calc Af Amer: >60 mL/min (ref >60)
GFR calc non Af Amer: >60 mL/min (ref >60)
GLUCOSE: 87 mg/dL (ref 65–99)
POTASSIUM: 4.3 mmol/L (ref 3.5–5.1)
SODIUM: 141 mmol/L (ref 135–145)

## 2018-05-02 LAB — DIGOXIN LEVEL: Digoxin Level: <0.2 ng/mL — ABNORMAL LOW (ref 0.8–2.0)

## 2018-05-02 NOTE — Patient Instructions (Signed)
Routine lab work today. Will notify you of abnormal results, otherwise no news is good news!  No changes to medication at this time.  Follow up 2 weeks.  ____________________________________________________________ Brittany [name] Code: 4536  Take all medication as prescribed the day of your appointment. Bring all medications with you to your appointment.  Do the following things EVERYDAY: 1) Weigh yourself in the morning before breakfast. Write it down and keep it in a log. 2) Take your medicines as prescribed 3) Eat low salt foods-Limit salt (sodium) to 2000 mg per day.  4) Stay as active as you can everyday 5) Limit all fluids for the day to less than 2 liters

## 2018-05-02 NOTE — Progress Notes (Signed)
Paramedicine Encounter   Patient ID: Brittany [name] , female,   DOB: 06-26-1961,56 y.o.,  MRN: [id number]   Met patient in clinic today with provider Erin/Amy.   Pt is still at the facility for rehab. Pt's breathing seems labored but is normal for her.  [name] got orthostatic bp during this visit due to her BP being on the lower than normal.  No orthostatic noted.  No med change during this visit.  Pt was given my information for her to call me once she is released to go home.     Time spent with patient 20 mins  [name], EMT-Paramedic 05/02/2018   ACTION: Home visit completed

## 2018-05-04 ENCOUNTER — Inpatient Hospital Stay (HOSPITAL_COMMUNITY)
Admission: EM | Admit: 2018-05-04 | Discharge: 2018-05-10 | DRG: 286 | Disposition: A | Discharge status: Home / Self Care | Payer: BLUE CROSS/BLUE SHIELD | Attending: Internal Medicine | Admitting: Internal Medicine

## 2018-05-04 ENCOUNTER — Encounter (HOSPITAL_COMMUNITY): Payer: Self-pay

## 2018-05-04 ENCOUNTER — Emergency Department (HOSPITAL_COMMUNITY): Payer: BLUE CROSS/BLUE SHIELD

## 2018-05-04 DIAGNOSIS — J440 Chronic obstructive pulmonary disease with acute lower respiratory infection: Secondary | ICD-10-CM | POA: Diagnosis present

## 2018-05-04 DIAGNOSIS — Y95 Nosocomial condition: Secondary | ICD-10-CM | POA: Diagnosis present

## 2018-05-04 DIAGNOSIS — I959 Hypotension, unspecified: Secondary | ICD-10-CM | POA: Diagnosis not present

## 2018-05-04 DIAGNOSIS — I480 Paroxysmal atrial fibrillation: Secondary | ICD-10-CM | POA: Diagnosis present

## 2018-05-04 DIAGNOSIS — Z951 Presence of aortocoronary bypass graft: Secondary | ICD-10-CM

## 2018-05-04 DIAGNOSIS — I5023 Acute on chronic systolic (congestive) heart failure: Secondary | ICD-10-CM | POA: Diagnosis not present

## 2018-05-04 DIAGNOSIS — J45909 Unspecified asthma, uncomplicated: Secondary | ICD-10-CM | POA: Diagnosis present

## 2018-05-04 DIAGNOSIS — N179 Acute kidney failure, unspecified: Secondary | ICD-10-CM | POA: Diagnosis not present

## 2018-05-04 DIAGNOSIS — I110 Hypertensive heart disease with heart failure: Principal | ICD-10-CM | POA: Diagnosis present

## 2018-05-04 DIAGNOSIS — Z9889 Other specified postprocedural states: Secondary | ICD-10-CM | POA: Diagnosis not present

## 2018-05-04 DIAGNOSIS — Z9981 Dependence on supplemental oxygen: Secondary | ICD-10-CM

## 2018-05-04 DIAGNOSIS — Z79899 Other long term (current) drug therapy: Secondary | ICD-10-CM

## 2018-05-04 DIAGNOSIS — I252 Old myocardial infarction: Secondary | ICD-10-CM

## 2018-05-04 DIAGNOSIS — J9600 Acute respiratory failure, unspecified whether with hypoxia or hypercapnia: Secondary | ICD-10-CM | POA: Diagnosis present

## 2018-05-04 DIAGNOSIS — J9621 Acute and chronic respiratory failure with hypoxia: Secondary | ICD-10-CM | POA: Diagnosis present

## 2018-05-04 DIAGNOSIS — Z87891 Personal history of nicotine dependence: Secondary | ICD-10-CM

## 2018-05-04 DIAGNOSIS — Z681 Body mass index (BMI) 19 or less, adult: Secondary | ICD-10-CM

## 2018-05-04 DIAGNOSIS — J90 Pleural effusion, not elsewhere classified: Secondary | ICD-10-CM | POA: Diagnosis present

## 2018-05-04 DIAGNOSIS — I509 Heart failure, unspecified: Secondary | ICD-10-CM

## 2018-05-04 DIAGNOSIS — I351 Nonrheumatic aortic (valve) insufficiency: Secondary | ICD-10-CM | POA: Diagnosis present

## 2018-05-04 DIAGNOSIS — I2510 Atherosclerotic heart disease of native coronary artery without angina pectoris: Secondary | ICD-10-CM | POA: Diagnosis present

## 2018-05-04 DIAGNOSIS — D638 Anemia in other chronic diseases classified elsewhere: Secondary | ICD-10-CM | POA: Diagnosis present

## 2018-05-04 DIAGNOSIS — E43 Unspecified severe protein-calorie malnutrition: Secondary | ICD-10-CM | POA: Diagnosis present

## 2018-05-04 DIAGNOSIS — Z923 Personal history of irradiation: Secondary | ICD-10-CM

## 2018-05-04 DIAGNOSIS — R079 Chest pain, unspecified: Secondary | ICD-10-CM | POA: Diagnosis present

## 2018-05-04 DIAGNOSIS — J918 Pleural effusion in other conditions classified elsewhere: Secondary | ICD-10-CM | POA: Diagnosis present

## 2018-05-04 DIAGNOSIS — R0600 Dyspnea, unspecified: Secondary | ICD-10-CM

## 2018-05-04 DIAGNOSIS — I10 Essential (primary) hypertension: Secondary | ICD-10-CM | POA: Diagnosis present

## 2018-05-04 DIAGNOSIS — Z7902 Long term (current) use of antithrombotics/antiplatelets: Secondary | ICD-10-CM

## 2018-05-04 DIAGNOSIS — J9611 Chronic respiratory failure with hypoxia: Secondary | ICD-10-CM | POA: Diagnosis not present

## 2018-05-04 DIAGNOSIS — Z7982 Long term (current) use of aspirin: Secondary | ICD-10-CM

## 2018-05-04 DIAGNOSIS — I4891 Unspecified atrial fibrillation: Secondary | ICD-10-CM | POA: Diagnosis present

## 2018-05-04 DIAGNOSIS — J189 Pneumonia, unspecified organism: Secondary | ICD-10-CM | POA: Diagnosis present

## 2018-05-04 DIAGNOSIS — Z85118 Personal history of other malignant neoplasm of bronchus and lung: Secondary | ICD-10-CM

## 2018-05-04 DIAGNOSIS — Z9221 Personal history of antineoplastic chemotherapy: Secondary | ICD-10-CM

## 2018-05-04 DIAGNOSIS — C3490 Malignant neoplasm of unspecified part of unspecified bronchus or lung: Secondary | ICD-10-CM | POA: Diagnosis present

## 2018-05-04 LAB — CBC WITH DIFFERENTIAL/PLATELET
ABS IMMATURE GRANULOCYTES: 0.1 10*3/uL (ref 0.0–0.1)
Basophils Absolute: 0.1 10*3/uL (ref 0.0–0.1)
Basophils Relative: 1 %
EOS ABS: 0.8 10*3/uL — AB (ref 0.0–0.7)
Eosinophils Relative: 6 %
HEMATOCRIT: 38.3 % (ref 36.0–46.0)
HEMOGLOBIN: 11.4 g/dL — AB (ref 12.0–15.0)
IMMATURE GRANULOCYTES: 0 %
LYMPHS ABS: 1.4 10*3/uL (ref 0.7–4.0)
LYMPHS PCT: 10 %
MCH: 27.3 pg (ref 26.0–34.0)
MCHC: 29.8 g/dL — ABNORMAL LOW (ref 30.0–36.0)
MCV: 91.6 fL (ref 78.0–100.0)
MONOS PCT: 4 %
Monocytes Absolute: 0.6 10*3/uL (ref 0.1–1.0)
NEUTROS ABS: 11.1 10*3/uL — AB (ref 1.7–7.7)
NEUTROS PCT: 79 %
Platelets: 410 10*3/uL — ABNORMAL HIGH (ref 150–400)
RBC: 4.18 MIL/uL (ref 3.87–5.11)
RDW: 18.4 % — AB (ref 11.5–15.5)
WBC: 14.1 10*3/uL — AB (ref 4.0–10.5)

## 2018-05-04 LAB — I-STAT VENOUS BLOOD GAS, ED
ACID-BASE DEFICIT: 6 mmol/L — AB (ref 0.0–2.0)
Bicarbonate: 22.7 mmol/L (ref 20.0–28.0)
O2 SAT: 20 %
TCO2: 24 mmol/L (ref 22–32)
pCO2, Ven: 56.7 mmHg (ref 44.0–60.0)
pH, Ven: 7.211 — ABNORMAL LOW (ref 7.250–7.430)
pO2, Ven: 19 mmHg — CL (ref 32.0–45.0)

## 2018-05-04 LAB — I-STAT CHEM 8, ED
BUN: 19 mg/dL (ref 6–20)
CHLORIDE: 105 mmol/L (ref 101–111)
CREATININE: 0.9 mg/dL (ref 0.44–1.00)
Calcium, Ion: 1.19 mmol/L (ref 1.15–1.40)
Glucose, Bld: 169 mg/dL — ABNORMAL HIGH (ref 65–99)
HEMATOCRIT: 39 % (ref 36.0–46.0)
HEMOGLOBIN: 13.3 g/dL (ref 12.0–15.0)
POTASSIUM: 4.7 mmol/L (ref 3.5–5.1)
Sodium: 138 mmol/L (ref 135–145)
TCO2: 23 mmol/L (ref 22–32)

## 2018-05-04 LAB — I-STAT TROPONIN, ED: Troponin i, poc: 0.05 ng/mL (ref 0.00–0.08)

## 2018-05-04 MED ORDER — PIPERACILLIN-TAZOBACTAM 3.375 G IVPB 30 MIN
3.3750 g | Freq: Once | INTRAVENOUS | Status: AC
  Administered 2018-05-04: 3.375 g via INTRAVENOUS
  Filled 2018-05-04: qty 50

## 2018-05-04 MED ORDER — FUROSEMIDE 10 MG/ML IJ SOLN
40.0000 mg | Freq: Once | INTRAMUSCULAR | Status: AC
  Administered 2018-05-04: 40 mg via INTRAVENOUS
  Filled 2018-05-04: qty 4

## 2018-05-04 MED ORDER — VANCOMYCIN HCL IN DEXTROSE 1-5 GM/200ML-% IV SOLN
1000.0000 mg | Freq: Once | INTRAVENOUS | Status: AC
  Administered 2018-05-04: 1000 mg via INTRAVENOUS
  Filled 2018-05-04: qty 200

## 2018-05-04 MED ORDER — NITROGLYCERIN IN D5W 200-5 MCG/ML-% IV SOLN
0.0000 ug/min | Freq: Once | INTRAVENOUS | Status: AC
  Administered 2018-05-04: 50 ug/min via INTRAVENOUS
  Filled 2018-05-04: qty 250

## 2018-05-04 NOTE — ED Provider Notes (Signed)
[facility] EMERGENCY DEPARTMENT Provider Note   CSN: [id number] Arrival date & time: 05/04/18  2007     History   Chief Complaint Chief Complaint  Patient presents with  . Respiratory Distress    HPI Brittany [name] is a 57 y.o. female.  The history is provided by the patient and the EMS personnel.  Shortness of Breath  This is a recurrent problem. The average episode lasts 2 days. The problem occurs continuously.The current episode started 2 days ago. The problem has been rapidly worsening. Associated symptoms include cough, sputum production and chest pain. Pertinent negatives include no fever, no sore throat, no ear pain, no vomiting, no abdominal pain and no rash. It is unknown what precipitated the problem. She has tried inhaled steroids and beta-agonist inhalers for the symptoms. The treatment provided mild relief. She has had prior hospitalizations. She has had prior ED visits. Associated medical issues include COPD, CAD, heart failure and past MI.    Past Medical History:  Diagnosis Date  . Acute systolic congestive heart failure ([place]) 02/03/2018  . Allergy   . Anxiety   . Asthma   . Hypertension   . PAF (paroxysmal atrial fibrillation) ([place])   . Prophylactic measure 08/03/14-08/19/14   Prophyl. cranial radiation 24 Gy  . 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 Small cell lung cancer ([place]) 03/16/2014    Patient Active Problem List   Diagnosis Date Noted  . Dyspnea 04/19/2018  . Acute respiratory failure with hypoxia ([place])   . STEMI involving left anterior descending coronary artery ([place]) 02/03/2018  . Cardiogenic shock ([place]) 02/03/2018  . Coronary artery disease involving native coronary artery of native heart with unstable angina pectoris ([place]) 02/03/2018  . Acute systolic congestive heart failure ([place]) 02/03/2018  . S/P  CABG (coronary artery bypass graft) 02/03/2018  . Prophylactic measure   . Hypertension   . Asthma   . Anxiety   . Allergy   . CAP (community acquired pneumonia) 03/15/2015  . Chest pain 03/15/2015  . Pneumonia 03/15/2015  . Lung cancer ([place])   . Small cell lung cancer ([place]) 03/16/2014  . Protein-calorie malnutrition, severe ([place]) 03/14/2014  . Atrial fibrillation ([place]) 03/12/2014  . SVT (supraventricular tachycardia) ([place]) 03/11/2014  . Lung mass 03/10/2014  . HTN (hypertension) 05/07/2012  . Asthma 05/07/2012    Past Surgical History:  Procedure Laterality Date  . CESAREAN SECTION    . CORONARY ARTERY BYPASS GRAFT N/A 02/03/2018   Procedure: CORONARY ARTERY BYPASS GRAFTING (CABG);  Surgeon: Rexene Alberts, MD;  Location: [facility];  Service: Open Heart Surgery;  Laterality: N/A;  Time 3 using left internal mammary artery and endoscopically harvested right saphenous vein  . CORONARY BALLOON ANGIOPLASTY N/A 02/03/2018   Procedure: CORONARY BALLOON ANGIOPLASTY;  Surgeon: Martinique, Peter M, MD;  Location: [facility] CV LAB;  Service: Cardiovascular;  Laterality: N/A;  . CORONARY/GRAFT ACUTE MI REVASCULARIZATION N/A 02/03/2018   Procedure: Coronary/Graft Acute MI Revascularization;  Surgeon: Martinique, Peter M, MD;  Location: [facility];  Service: Cardiovascular;  Laterality: N/A;  . IABP INSERTION N/A 02/03/2018   Procedure: IABP Insertion;  Surgeon: Martinique, Peter M, MD;  Location: [facility];  Service: Cardiovascular;  Laterality: N/A;  . INTRAOPERATIVE TRANSESOPHAGEAL ECHOCARDIOGRAM N/A 02/03/2018   Procedure: INTRAOPERATIVE TRANSESOPHAGEAL ECHOCARDIOGRAM;  Surgeon: Rexene Alberts,  MD;  Location: [facility];  Service: Open Heart Surgery;  Laterality: N/A;  . LEFT HEART CATH AND CORONARY ANGIOGRAPHY N/A 02/03/2018   Procedure: LEFT HEART CATH AND CORONARY ANGIOGRAPHY;  Surgeon: Martinique, Peter M, MD;  Location: [facility];  Service: Cardiovascular;  Laterality: N/A;  .  MEDIASTINOSCOPY N/A 03/11/2014   Procedure: MEDIASTINOSCOPY;  Surgeon: Melrose Nakayama, MD;  Location: [name];  Service: Thoracic;  Laterality: N/A;  . PLACEMENT OF [place] LEFT VENTRICULAR ASSIST DEVICE  02/03/2018   Procedure: PLACEMENT OF [place] LEFT VENTRICULAR ASSIST DEVICE LD;  Surgeon: Rexene Alberts, MD;  Location: [facility];  Service: Open Heart Surgery;;  . REMOVAL OF IMPELLA LEFT VENTRICULAR ASSIST DEVICE N/A 02/08/2018   Procedure: REMOVAL OF [place] LEFT VENTRICULAR ASSIST DEVICE;  Surgeon: Rexene Alberts, MD;  Location: [facility];  Service: Open Heart Surgery;  Laterality: N/A;  . RIGHT HEART CATH N/A 02/03/2018   Procedure: RIGHT HEART CATH;  Surgeon: Martinique, Peter M, MD;  Location: [facility];  Service: Cardiovascular;  Laterality: N/A;  . TEE WITHOUT CARDIOVERSION N/A 02/08/2018   Procedure: TRANSESOPHAGEAL ECHOCARDIOGRAM (TEE);  Surgeon: Rexene Alberts, MD;  Location: [facility];  Service: Open Heart Surgery;  Laterality: N/A;  . TUBAL LIGATION    . VIDEO BRONCHOSCOPY WITH ENDOBRONCHIAL ULTRASOUND N/A 03/11/2014   Procedure: VIDEO BRONCHOSCOPY WITH ENDOBRONCHIAL ULTRASOUND;  Surgeon: Melrose Nakayama, MD;  Location: [facility];  Service: Thoracic;  Laterality: N/A;     OB History   None      Home Medications    Prior to Admission medications   Medication Sig Start Date End Date Taking? Authorizing Provider  aspirin EC 81 MG EC tablet Take 1 tablet (81 mg total) by mouth daily. 02/25/18  Yes Gold, Wayne E, PA-C  atorvastatin (LIPITOR) 80 MG tablet Take 1 tablet (80 mg total) by mouth daily at 6 PM. 02/25/18  Yes Gold, Wilder Glade, PA-C  clopidogrel (PLAVIX) 75 MG tablet Take 1 tablet (75 mg total) by mouth daily. 02/25/18  Yes Gold, Wayne E, PA-C  digoxin 62.5 MCG TABS Take 0.0625 mg by mouth daily. 02/25/18  Yes Gold, Wayne E, PA-C  docusate sodium (COLACE) 100 MG capsule Take 1 capsule (100 mg total) by mouth 2 (two) times daily as needed for mild constipation. Patient taking  differently: Take 100 mg by mouth daily.  03/20/18  Yes Clegg, Amy D, NP  furosemide (LASIX) 20 MG tablet Take 20 mg by mouth daily.   Yes [provider]  ipratropium-albuterol (DUONEB) 0.5-2.5 (3) MG/3ML SOLN Take 3 mLs by nebulization 2 (two) times daily. 02/25/18  Yes Gold, Wayne E, PA-C  ivabradine (CORLANOR) 7.5 MG TABS tablet Take 1 tablet (7.5 mg total) by mouth 2 (two) times daily with a meal. 04/25/18  Yes Clegg, Amy D, NP  losartan (COZAAR) 25 MG tablet Take 0.5 tablets (12.5 mg total) by mouth daily. 04/25/18  Yes Clegg, Amy D, NP  oxyCODONE (OXY IR/ROXICODONE) 5 MG immediate release tablet Take 1-2 tablets (5-10 mg total) by mouth every 12 (twelve) hours as needed for severe pain. Patient taking differently: Take 5-10 mg by mouth every 6 (six) hours as needed (pain).  04/25/18  Yes Clegg, Amy D, NP  spironolactone (ALDACTONE) 25 MG tablet Take 0.5 tablets (12.5 mg total) by mouth daily. 03/20/18  Yes Clegg, Amy D, NP  furosemide (LASIX) 40 MG tablet Take 0.5 tablets (20 mg total) by mouth daily. Patient not taking: Reported on  05/04/2018 04/25/18   Conrad [place], NP    Family History Family History  Problem Relation Age of Onset  . Heart disease Mother   . Hypertension Mother   . Heart attack Mother   . Hypertension Maternal Grandmother   . Cancer Maternal Grandmother   . Diabetes Paternal Grandmother   . Stroke Neg Hx     Social History Social History   Tobacco Use  . Smoking status: Former Smoker    Packs/day: 1.00    Years: 20.00    Pack years: 20.00    Types: Cigarettes    Last attempt to quit: 03/13/2014    Years since quitting: 4.1  . Smokeless tobacco: Never Used  Substance Use Topics  . Alcohol use: Yes    Alcohol/week: 4.8 oz    Types: 8 Glasses of wine per week  . Drug use: No     Allergies   Iodinated diagnostic agents; Latex; Pineapple; Sulfa antibiotics; and Codeine   Review of Systems Review of Systems  Constitutional: Negative for chills and  fever.  HENT: Negative for ear pain and sore throat.   Eyes: Negative for pain and visual disturbance.  Respiratory: Positive for cough, sputum production and shortness of breath.   Cardiovascular: Positive for chest pain. Negative for palpitations.  Gastrointestinal: Negative for abdominal pain and vomiting.  Genitourinary: Negative for dysuria and hematuria.  Musculoskeletal: Negative for arthralgias and back pain.  Skin: Negative for color change and rash.  Neurological: Negative for seizures and syncope.  All other systems reviewed and are negative.    Physical Exam Updated Vital Signs BP (!) 84/55   Pulse 85   Resp 20   Ht 4' 11.5" (1.511 m)   Wt 45.8 kg (101 lb)   SpO2 100%   BMI 20.06 kg/m   Physical Exam  Constitutional: She is oriented to person, place, and time. She appears well-developed and well-nourished. She appears distressed.  HENT:  Head: Normocephalic and atraumatic.  Eyes: Conjunctivae are normal.  Neck: Neck supple.  Cardiovascular: Regular rhythm.  No murmur heard. Tachycardic, regular rhythm   Pulmonary/Chest: She is in respiratory distress. She has rales.  Tachypnea, accessory muscle use, diffuse rales to the apices  Abdominal: Soft. She exhibits no distension. There is no tenderness.  Musculoskeletal: She exhibits no edema or tenderness.  Neurological: She is alert and oriented to person, place, and time.  Skin:  Cool, clammy skin  Psychiatric: She has a normal mood and affect.  Nursing note and vitals reviewed.    ED Treatments / Results  Labs (all labs ordered are listed, but only abnormal results are displayed) Labs Reviewed  CBC WITH DIFFERENTIAL/PLATELET - Abnormal; Notable for the following components:      Result Value   WBC 14.1 (*)    Hemoglobin 11.4 (*)    MCHC 29.8 (*)    RDW 18.4 (*)    Platelets 410 (*)    Neutro Abs 11.1 (*)    Eosinophils Absolute 0.8 (*)    All other components within normal limits  I-STAT CHEM 8, ED  - Abnormal; Notable for the following components:   Glucose, Bld 169 (*)    All other components within normal limits  I-STAT VENOUS BLOOD GAS, ED - Abnormal; Notable for the following components:   pH, Ven 7.211 (*)    pO2, Ven 19.0 (*)    Acid-base deficit 6.0 (*)    All other components within normal limits  CULTURE, BLOOD (ROUTINE X 2)  CULTURE, BLOOD (ROUTINE X 2)  I-STAT TROPONIN, ED    EKG EKG Interpretation  Date/Time:  Saturday May 04 2018 20:09:20 EDT Ventricular Rate:  130 PR Interval:    QRS Duration: 106 QT Interval:  308 QTC Calculation: 453 R Axis:   128 Text Interpretation:  Sinus tachycardia Probable left atrial enlargement Probable anterolateral infarct No STEMI.  Confirmed by Nanda Quinton [phone]) on 05/04/2018 8:14:52 PM   Radiology Dg Chest Portable 1 View  Result Date: 05/04/2018 CLINICAL DATA:  Respiratory distress; chest pain EXAM: PORTABLE CHEST 1 VIEW COMPARISON:  May 12 19 FINDINGS: There is a moderate pleural effusion on the right with consolidation in portions of the right middle and lower lobes. There is underlying mild interstitial pulmonary edema. Heart is mildly enlarged with pulmonary vascularity within normal limits. Patient is status post coronary artery bypass grafting. No. No evident lesions. IMPRESSION: Moderate pleural effusion on the right with consolidation in portions of the right middle and lower lobes. Mild interstitial edema. Cardiac prominence. Postoperative change noted. Electronically Signed   By: Lowella Grip III M.D.   On: 05/04/2018 20:34    Procedures Procedures (including critical care time)  Medications Ordered in ED Medications  vancomycin (VANCOCIN) IVPB 1000 mg/200 mL premix (has no administration in time range)  nitroGLYCERIN 50 mg in dextrose 5 % 250 mL (0.2 mg/mL) infusion (0 mcg/min Intravenous Stopped 05/04/18 2123)  furosemide (LASIX) injection 40 mg (40 mg Intravenous Given 05/04/18 2018)    piperacillin-tazobactam (ZOSYN) IVPB 3.375 g (0 g Intravenous Stopped 05/04/18 2229)     Initial Impression / Assessment and Plan / ED Course  I have reviewed the triage vital signs and the nursing notes.  Pertinent labs & imaging results that were available during my care of the patient were reviewed by me and considered in my medical decision making (see chart for details).    Patient is a 57 year old female with complex medical history as above who presents with 2 days of worsening respiratory distress.  She was recently admitted and discharged from the advanced heart failure service.  Is been in a rehab facility since her discharge.  She became acutely short of breath this evening after a couple days of gradually worsening symptoms.  She endorses chest pain as well.  On arrival, she is tachypneic with markedly increased work of breathing.  She is also cool and clammy.  She was placed on CPAP with EMS and she reports mild improvement.  She is hypertensive.  We will continue her on BiPAP and also start with nitroglycerin drip.  Patient's blood pressure and work of breathing has significantly improved.  We were able to wean the nitroglycerin drip off.  She continues to breathe well on BiPAP.  Her troponin is negative.  Her EKG showed sinus tachycardia with some nonspecific ST segment changes.  Suspect these were rate and demand related.  With her negative Trop, I am less concerned about ACS.  She does have a leukocytosis and acidosis.  Chest x-ray shows a recurrent right-sided pleural effusion as well as right-sided consolidations.  Blood cultures ordered and she will be given broad-spectrum antibiotics.  Given her recent admission to heart failure service, cardiology was consulted.  They have reviewed her records and will follow her for her heart failure but recommend admission to hospitalist.  I have spoken with the hospitalist regarding her admission.  Final Clinical Impressions(s) / ED  Diagnoses   Final diagnoses:  HCAP (healthcare-associated pneumonia)  Congestive heart failure, unspecified  HF chronicity, unspecified heart failure type [facility])    ED Discharge Orders    None       Clifton James, MD 05/04/18 2313    Margette Fast, MD 05/05/18 [phone]

## 2018-05-04 NOTE — ED Notes (Signed)
Nitro d/c'd per Dr. Melina Copa

## 2018-05-04 NOTE — ED Triage Notes (Signed)
Pt arrived via [facility] from [facility] care with increasing respiratory distress over 2 days.  Generalized chest pain for 2 days.  EMS gave 5mg  albuterol, no IV obtained

## 2018-05-05 ENCOUNTER — Inpatient Hospital Stay (HOSPITAL_COMMUNITY): Payer: BLUE CROSS/BLUE SHIELD

## 2018-05-05 ENCOUNTER — Other Ambulatory Visit: Payer: Self-pay

## 2018-05-05 ENCOUNTER — Encounter (HOSPITAL_COMMUNITY): Payer: Self-pay | Admitting: Family Medicine

## 2018-05-05 DIAGNOSIS — Z951 Presence of aortocoronary bypass graft: Secondary | ICD-10-CM | POA: Diagnosis not present

## 2018-05-05 DIAGNOSIS — Z87891 Personal history of nicotine dependence: Secondary | ICD-10-CM | POA: Diagnosis not present

## 2018-05-05 DIAGNOSIS — J9600 Acute respiratory failure, unspecified whether with hypoxia or hypercapnia: Secondary | ICD-10-CM

## 2018-05-05 DIAGNOSIS — I2510 Atherosclerotic heart disease of native coronary artery without angina pectoris: Secondary | ICD-10-CM | POA: Diagnosis present

## 2018-05-05 DIAGNOSIS — Z9221 Personal history of antineoplastic chemotherapy: Secondary | ICD-10-CM | POA: Diagnosis not present

## 2018-05-05 DIAGNOSIS — I110 Hypertensive heart disease with heart failure: Secondary | ICD-10-CM | POA: Diagnosis present

## 2018-05-05 DIAGNOSIS — C3490 Malignant neoplasm of unspecified part of unspecified bronchus or lung: Secondary | ICD-10-CM | POA: Diagnosis not present

## 2018-05-05 DIAGNOSIS — Z923 Personal history of irradiation: Secondary | ICD-10-CM | POA: Diagnosis not present

## 2018-05-05 DIAGNOSIS — J440 Chronic obstructive pulmonary disease with acute lower respiratory infection: Secondary | ICD-10-CM | POA: Diagnosis present

## 2018-05-05 DIAGNOSIS — J9621 Acute and chronic respiratory failure with hypoxia: Secondary | ICD-10-CM | POA: Diagnosis present

## 2018-05-05 DIAGNOSIS — I351 Nonrheumatic aortic (valve) insufficiency: Secondary | ICD-10-CM | POA: Diagnosis present

## 2018-05-05 DIAGNOSIS — Z85118 Personal history of other malignant neoplasm of bronchus and lung: Secondary | ICD-10-CM | POA: Diagnosis not present

## 2018-05-05 DIAGNOSIS — J918 Pleural effusion in other conditions classified elsewhere: Secondary | ICD-10-CM | POA: Diagnosis present

## 2018-05-05 DIAGNOSIS — J90 Pleural effusion, not elsewhere classified: Secondary | ICD-10-CM | POA: Diagnosis not present

## 2018-05-05 DIAGNOSIS — E43 Unspecified severe protein-calorie malnutrition: Secondary | ICD-10-CM | POA: Diagnosis present

## 2018-05-05 DIAGNOSIS — Z9889 Other specified postprocedural states: Secondary | ICD-10-CM | POA: Diagnosis present

## 2018-05-05 DIAGNOSIS — D638 Anemia in other chronic diseases classified elsewhere: Secondary | ICD-10-CM | POA: Diagnosis present

## 2018-05-05 DIAGNOSIS — I509 Heart failure, unspecified: Secondary | ICD-10-CM

## 2018-05-05 DIAGNOSIS — Y95 Nosocomial condition: Secondary | ICD-10-CM | POA: Diagnosis present

## 2018-05-05 DIAGNOSIS — Z9981 Dependence on supplemental oxygen: Secondary | ICD-10-CM | POA: Diagnosis not present

## 2018-05-05 DIAGNOSIS — N179 Acute kidney failure, unspecified: Secondary | ICD-10-CM | POA: Diagnosis not present

## 2018-05-05 DIAGNOSIS — J45909 Unspecified asthma, uncomplicated: Secondary | ICD-10-CM | POA: Diagnosis not present

## 2018-05-05 DIAGNOSIS — I252 Old myocardial infarction: Secondary | ICD-10-CM | POA: Diagnosis not present

## 2018-05-05 DIAGNOSIS — J9601 Acute respiratory failure with hypoxia: Secondary | ICD-10-CM | POA: Diagnosis not present

## 2018-05-05 DIAGNOSIS — I480 Paroxysmal atrial fibrillation: Secondary | ICD-10-CM | POA: Diagnosis present

## 2018-05-05 DIAGNOSIS — J9611 Chronic respiratory failure with hypoxia: Secondary | ICD-10-CM | POA: Diagnosis not present

## 2018-05-05 DIAGNOSIS — I959 Hypotension, unspecified: Secondary | ICD-10-CM | POA: Diagnosis not present

## 2018-05-05 DIAGNOSIS — Z7982 Long term (current) use of aspirin: Secondary | ICD-10-CM | POA: Diagnosis not present

## 2018-05-05 DIAGNOSIS — Z681 Body mass index (BMI) 19 or less, adult: Secondary | ICD-10-CM | POA: Diagnosis not present

## 2018-05-05 DIAGNOSIS — J189 Pneumonia, unspecified organism: Secondary | ICD-10-CM | POA: Diagnosis present

## 2018-05-05 DIAGNOSIS — Z79899 Other long term (current) drug therapy: Secondary | ICD-10-CM | POA: Diagnosis not present

## 2018-05-05 DIAGNOSIS — I5023 Acute on chronic systolic (congestive) heart failure: Secondary | ICD-10-CM | POA: Diagnosis present

## 2018-05-05 DIAGNOSIS — Z7902 Long term (current) use of antithrombotics/antiplatelets: Secondary | ICD-10-CM | POA: Diagnosis not present

## 2018-05-05 HISTORY — DX: Acute respiratory failure, unspecified whether with hypoxia or hypercapnia: J96.00

## 2018-05-05 LAB — BRAIN NATRIURETIC PEPTIDE: B Natriuretic Peptide: 2601.1 pg/mL — ABNORMAL HIGH (ref 0.0–100.0)

## 2018-05-05 LAB — CBC WITH DIFFERENTIAL/PLATELET
Abs Immature Granulocytes: 0.1 10*3/uL (ref 0.0–0.1)
BASOS ABS: 0 10*3/uL (ref 0.0–0.1)
Basophils Relative: 0 %
EOS PCT: 0 %
Eosinophils Absolute: 0.1 10*3/uL (ref 0.0–0.7)
HEMATOCRIT: 33.2 % — AB (ref 36.0–46.0)
HEMOGLOBIN: 10.2 g/dL — AB (ref 12.0–15.0)
Immature Granulocytes: 0 %
LYMPHS PCT: 5 %
Lymphs Abs: 0.7 10*3/uL (ref 0.7–4.0)
MCH: 27.1 pg (ref 26.0–34.0)
MCHC: 30.7 g/dL (ref 30.0–36.0)
MCV: 88.3 fL (ref 78.0–100.0)
MONO ABS: 0.6 10*3/uL (ref 0.1–1.0)
MONOS PCT: 4 %
NEUTROS ABS: 13.1 10*3/uL — AB (ref 1.7–7.7)
Neutrophils Relative %: 91 %
Platelets: 289 10*3/uL (ref 150–400)
RBC: 3.76 MIL/uL — ABNORMAL LOW (ref 3.87–5.11)
RDW: 18.1 % — ABNORMAL HIGH (ref 11.5–15.5)
WBC: 14.5 10*3/uL — ABNORMAL HIGH (ref 4.0–10.5)

## 2018-05-05 LAB — BODY FLUID CELL COUNT WITH DIFFERENTIAL
EOS FL: 20 %
LYMPHS FL: 21 %
MONOCYTE-MACROPHAGE-SEROUS FLUID: 42 % — AB (ref 50–90)
NEUTROPHIL FLUID: 17 % (ref 0–25)
WBC FLUID: 851 uL (ref 0–1000)

## 2018-05-05 LAB — HEPATIC FUNCTION PANEL
ALK PHOS: 80 U/L (ref 38–126)
ALT: 45 U/L (ref 14–54)
AST: 35 U/L (ref 15–41)
Albumin: 3.4 g/dL — ABNORMAL LOW (ref 3.5–5.0)
BILIRUBIN TOTAL: 0.9 mg/dL (ref 0.3–1.2)
Total Protein: 6.7 g/dL (ref 6.5–8.1)

## 2018-05-05 LAB — PROCALCITONIN: PROCALCITONIN: 1.38 ng/mL

## 2018-05-05 LAB — LACTATE DEHYDROGENASE, PLEURAL OR PERITONEAL FLUID: LD, Fluid: 138 U/L — ABNORMAL HIGH (ref 3–23)

## 2018-05-05 LAB — LACTIC ACID, PLASMA: Lactic Acid, Venous: 1.1 mmol/L (ref 0.5–1.9)

## 2018-05-05 LAB — LACTATE DEHYDROGENASE: LDH: 177 U/L (ref 98–192)

## 2018-05-05 LAB — PROTEIN, PLEURAL OR PERITONEAL FLUID: TOTAL PROTEIN, FLUID: 3.2 g/dL

## 2018-05-05 LAB — HIV ANTIBODY (ROUTINE TESTING W REFLEX): HIV SCREEN 4TH GENERATION: NONREACTIVE

## 2018-05-05 MED ORDER — FUROSEMIDE 10 MG/ML IJ SOLN
40.0000 mg | Freq: Two times a day (BID) | INTRAMUSCULAR | Status: DC
  Administered 2018-05-05 – 2018-05-06 (×4): 40 mg via INTRAVENOUS
  Filled 2018-05-05 (×5): qty 4

## 2018-05-05 MED ORDER — ONDANSETRON HCL 4 MG/2ML IJ SOLN
4.0000 mg | Freq: Four times a day (QID) | INTRAMUSCULAR | Status: DC | PRN
  Administered 2018-05-06: 4 mg via INTRAVENOUS
  Filled 2018-05-05: qty 2

## 2018-05-05 MED ORDER — SODIUM CHLORIDE 0.9 % IV SOLN: 250.0000 mL | INTRAVENOUS | Status: DC | PRN

## 2018-05-05 MED ORDER — ATORVASTATIN CALCIUM 80 MG PO TABS
80.0000 mg | ORAL_TABLET | Freq: Every day | ORAL | Status: DC
  Administered 2018-05-05 – 2018-05-09 (×5): 80 mg via ORAL
  Filled 2018-05-05 (×6): qty 1

## 2018-05-05 MED ORDER — ASPIRIN EC 81 MG PO TBEC
81.0000 mg | DELAYED_RELEASE_TABLET | Freq: Every day | ORAL | Status: DC
  Administered 2018-05-05 – 2018-05-10 (×7): 81 mg via ORAL
  Filled 2018-05-05 (×8): qty 1

## 2018-05-05 MED ORDER — VANCOMYCIN HCL IN DEXTROSE 1-5 GM/200ML-% IV SOLN
1000.0000 mg | INTRAVENOUS | Status: DC
  Administered 2018-05-05 – 2018-05-06 (×2): 1000 mg via INTRAVENOUS
  Filled 2018-05-05 (×3): qty 200

## 2018-05-05 MED ORDER — LIDOCAINE HCL (PF) 1 % IJ SOLN
INTRAMUSCULAR | Status: AC
  Administered 2018-05-05: 14:00:00
  Filled 2018-05-05: qty 30

## 2018-05-05 MED ORDER — ALBUTEROL SULFATE (2.5 MG/3ML) 0.083% IN NEBU
2.5000 mg | INHALATION_SOLUTION | Freq: Four times a day (QID) | RESPIRATORY_TRACT | Status: DC | PRN
  Filled 2018-05-05: qty 3

## 2018-05-05 MED ORDER — SODIUM CHLORIDE 0.9% FLUSH: 3.0000 mL | INTRAVENOUS | Status: DC | PRN

## 2018-05-05 MED ORDER — IVABRADINE HCL 7.5 MG PO TABS
7.5000 mg | ORAL_TABLET | Freq: Two times a day (BID) | ORAL | Status: DC
  Administered 2018-05-05 – 2018-05-10 (×9): 7.5 mg via ORAL
  Filled 2018-05-05 (×12): qty 1

## 2018-05-05 MED ORDER — IPRATROPIUM-ALBUTEROL 0.5-2.5 (3) MG/3ML IN SOLN
3.0000 mL | Freq: Two times a day (BID) | RESPIRATORY_TRACT | Status: DC
  Administered 2018-05-05 – 2018-05-10 (×10): 3 mL via RESPIRATORY_TRACT
  Filled 2018-05-05 (×10): qty 3

## 2018-05-05 MED ORDER — ENOXAPARIN SODIUM 40 MG/0.4ML ~~LOC~~ SOLN
40.0000 mg | SUBCUTANEOUS | Status: DC
  Administered 2018-05-05 – 2018-05-08 (×4): 40 mg via SUBCUTANEOUS
  Filled 2018-05-05 (×5): qty 0.4

## 2018-05-05 MED ORDER — SPIRONOLACTONE 12.5 MG HALF TABLET
12.5000 mg | ORAL_TABLET | Freq: Every day | ORAL | Status: DC
  Administered 2018-05-05 – 2018-05-06 (×2): 12.5 mg via ORAL
  Filled 2018-05-05 (×3): qty 1

## 2018-05-05 MED ORDER — DOCUSATE SODIUM 100 MG PO CAPS: 100.0000 mg | ORAL_CAPSULE | Freq: Two times a day (BID) | ORAL | Status: DC | PRN

## 2018-05-05 MED ORDER — DIGOXIN 0.0625 MG HALF TABLET
0.0625 mg | ORAL_TABLET | Freq: Every day | ORAL | Status: DC
  Administered 2018-05-05 – 2018-05-10 (×6): 0.0625 mg via ORAL
  Filled 2018-05-05 (×6): qty 1

## 2018-05-05 MED ORDER — OXYCODONE HCL 5 MG PO TABS
5.0000 mg | ORAL_TABLET | Freq: Four times a day (QID) | ORAL | Status: DC | PRN
  Administered 2018-05-05 – 2018-05-06 (×2): 5 mg via ORAL
  Filled 2018-05-05 (×2): qty 1

## 2018-05-05 MED ORDER — SODIUM CHLORIDE 0.9% FLUSH
3.0000 mL | Freq: Two times a day (BID) | INTRAVENOUS | Status: DC
  Administered 2018-05-05 – 2018-05-09 (×7): 3 mL via INTRAVENOUS

## 2018-05-05 MED ORDER — SODIUM CHLORIDE 0.9 % IV SOLN
1.0000 g | Freq: Two times a day (BID) | INTRAVENOUS | Status: DC
  Administered 2018-05-05 (×3): 1 g via INTRAVENOUS
  Filled 2018-05-05 (×5): qty 1

## 2018-05-05 MED ORDER — IPRATROPIUM-ALBUTEROL 0.5-2.5 (3) MG/3ML IN SOLN
3.0000 mL | Freq: Two times a day (BID) | RESPIRATORY_TRACT | Status: DC
  Administered 2018-05-05: 3 mL via RESPIRATORY_TRACT
  Filled 2018-05-05: qty 3

## 2018-05-05 MED ORDER — ACETAMINOPHEN 325 MG PO TABS: 650.0000 mg | ORAL_TABLET | ORAL | Status: DC | PRN

## 2018-05-05 MED ORDER — CLOPIDOGREL BISULFATE 75 MG PO TABS
75.0000 mg | ORAL_TABLET | Freq: Every day | ORAL | Status: DC
  Administered 2018-05-05 – 2018-05-10 (×6): 75 mg via ORAL
  Filled 2018-05-05 (×7): qty 1

## 2018-05-05 MED ORDER — ENSURE ENLIVE PO LIQD
237.0000 mL | Freq: Three times a day (TID) | ORAL | Status: DC
  Administered 2018-05-05 – 2018-05-10 (×10): 237 mL via ORAL

## 2018-05-05 MED ORDER — LOSARTAN POTASSIUM 25 MG PO TABS
12.5000 mg | ORAL_TABLET | Freq: Every day | ORAL | Status: DC
  Administered 2018-05-06: 12.5 mg via ORAL
  Filled 2018-05-05: qty 1
  Filled 2018-05-05: qty 0.5

## 2018-05-05 NOTE — H&P (Signed)
History and Physical    Brittany [name] CHE:[id number] DOB: Oct 12, 1961 DOA: 05/04/2018  PCP: System, Pcp Not In   Patient coming from: Home  Chief Complaint: SOB, chest pain   HPI: Brittany [name] is a 57 y.o. female with medical history significant for chronic systolic CHF with EF 53%, coronary artery disease status post CABG, history of paroxysmal atrial fibrillation not anticoagulated, and chronic pain, now presenting to the emergency department with 2 days of progressive shortness of breath.  Patient was discharged from the hospital approximately 10 days ago after management for acute on chronic CHF, had been doing well, but noted the insidious development of worsening dyspnea approximately 2 days ago.  She has continued to worsen since that time and became very symptomatic while at rest today.  She denies fevers, chills, or productive cough.  She denies any lower extremity edema or tenderness.  ED Course: Upon arrival to the ED, patient is found to be afebrile, saturating adequately BiPAP, tachypneic to 30, initially tachycardic to 130, and with blood pressure 88/60.  EKG features a sinus tachycardia with rate 130 and chest x-ray is notable for moderate right pleural effusion with consolidation in the right middle and lower lobes, as well as mild interstitial edema.  Chemistry panel reveals a glucose of 169 and CBC is notable for leukocytosis to 14,100, thrombocytosis with platelets 410,000.  Troponin is within normal limits.  Patient was treated with vancomycin and Zosyn after blood cultures have been collected and she was started on BiPAP.  She was also given 40 mg of IV Lasix and cardiology was consulted by the ED physician.  Cardiology recommended medical admission.  Patient has improved with measures taken in the ED, heart rate is normalized, blood pressure improved, and she is off BiPAP.  She will be admitted for ongoing evaluation and management of acute respiratory distress,  likely multifactorial with contributions from acute on chronic CHF, recurrent pleural effusion, and possible pneumonia.  Review of Systems:  All other systems reviewed and apart from HPI, are negative.  Past Medical History:  Diagnosis Date  . Acute systolic congestive heart failure ([place]) 02/03/2018  . Allergy   . Anxiety   . Asthma   . Hypertension   . PAF (paroxysmal atrial fibrillation) ([place])   . Prophylactic measure 08/03/14-08/19/14   Prophyl. cranial radiation 24 Gy  . 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 Small cell lung cancer ([place]) 03/16/2014    Past Surgical History:  Procedure Laterality Date  . CESAREAN SECTION    . CORONARY ARTERY BYPASS GRAFT N/A 02/03/2018   Procedure: CORONARY ARTERY BYPASS GRAFTING (CABG);  Surgeon: Rexene Alberts, MD;  Location: [place];  Service: Open Heart Surgery;  Laterality: N/A;  Time 3 using left internal mammary artery and endoscopically harvested right saphenous vein  . CORONARY BALLOON ANGIOPLASTY N/A 02/03/2018   Procedure: CORONARY BALLOON ANGIOPLASTY;  Surgeon: Martinique, Peter M, MD;  Location: [facility];  Service: Cardiovascular;  Laterality: N/A;  . CORONARY/GRAFT ACUTE MI REVASCULARIZATION N/A 02/03/2018   Procedure: Coronary/Graft Acute MI Revascularization;  Surgeon: Martinique, Peter M, MD;  Location: [facility];  Service: Cardiovascular;  Laterality: N/A;  . IABP INSERTION N/A 02/03/2018   Procedure: IABP Insertion;  Surgeon: Martinique, Peter M, MD;  Location: [facility];  Service: Cardiovascular;  Laterality: N/A;  .  INTRAOPERATIVE TRANSESOPHAGEAL ECHOCARDIOGRAM N/A 02/03/2018   Procedure: INTRAOPERATIVE TRANSESOPHAGEAL ECHOCARDIOGRAM;  Surgeon: Rexene Alberts, MD;  Location: [place];  Service: Open Heart Surgery;  Laterality: N/A;  . LEFT HEART CATH AND CORONARY ANGIOGRAPHY N/A 02/03/2018   Procedure:  LEFT HEART CATH AND CORONARY ANGIOGRAPHY;  Surgeon: Martinique, Peter M, MD;  Location: [facility];  Service: Cardiovascular;  Laterality: N/A;  . MEDIASTINOSCOPY N/A 03/11/2014   Procedure: MEDIASTINOSCOPY;  Surgeon: Melrose Nakayama, MD;  Location: [place];  Service: Thoracic;  Laterality: N/A;  . PLACEMENT OF [place] LEFT VENTRICULAR ASSIST DEVICE  02/03/2018   Procedure: PLACEMENT OF [place] LEFT VENTRICULAR ASSIST DEVICE LD;  Surgeon: Rexene Alberts, MD;  Location: [facility];  Service: Open Heart Surgery;;  . REMOVAL OF IMPELLA LEFT VENTRICULAR ASSIST DEVICE N/A 02/08/2018   Procedure: REMOVAL OF [place] LEFT VENTRICULAR ASSIST DEVICE;  Surgeon: Rexene Alberts, MD;  Location: [place];  Service: Open Heart Surgery;  Laterality: N/A;  . RIGHT HEART CATH N/A 02/03/2018   Procedure: RIGHT HEART CATH;  Surgeon: Martinique, Peter M, MD;  Location: [facility];  Service: Cardiovascular;  Laterality: N/A;  . TEE WITHOUT CARDIOVERSION N/A 02/08/2018   Procedure: TRANSESOPHAGEAL ECHOCARDIOGRAM (TEE);  Surgeon: Rexene Alberts, MD;  Location: [facility];  Service: Open Heart Surgery;  Laterality: N/A;  . TUBAL LIGATION    . VIDEO BRONCHOSCOPY WITH ENDOBRONCHIAL ULTRASOUND N/A 03/11/2014   Procedure: VIDEO BRONCHOSCOPY WITH ENDOBRONCHIAL ULTRASOUND;  Surgeon: Melrose Nakayama, MD;  Location: [place];  Service: Thoracic;  Laterality: N/A;     reports that she quit smoking about 4 years ago. Her smoking use included cigarettes. She has a 20.00 pack-year smoking history. She has never used smokeless tobacco. She reports that she drinks about 4.8 oz of alcohol per week. She reports that she does not use drugs.  Allergies  Allergen Reactions  . Iodinated Diagnostic Agents Hives and Rash    Needs 13-hour prep before CT scans w/ contrast.  Can do Benadryl 50mg  PO for ESI's, etc  . Latex Hives and Other (See Comments)    Skin irritation  . Pineapple Hives  . Sulfa Antibiotics Hives and Rash  . Codeine Nausea  And Vomiting    Family History  Problem Relation Age of Onset  . Heart disease Mother   . Hypertension Mother   . Heart attack Mother   . Hypertension Maternal Grandmother   . Cancer Maternal Grandmother   . Diabetes Paternal Grandmother   . Stroke Neg Hx      Prior to Admission medications   Medication Sig Start Date End Date Taking? Authorizing Provider  aspirin EC 81 MG EC tablet Take 1 tablet (81 mg total) by mouth daily. 02/25/18  Yes Gold, Wayne E, PA-C  atorvastatin (LIPITOR) 80 MG tablet Take 1 tablet (80 mg total) by mouth daily at 6 PM. 02/25/18  Yes Gold, Wilder Glade, PA-C  clopidogrel (PLAVIX) 75 MG tablet Take 1 tablet (75 mg total) by mouth daily. 02/25/18  Yes Gold, Wayne E, PA-C  digoxin 62.5 MCG TABS Take 0.0625 mg by mouth daily. 02/25/18  Yes Gold, Wayne E, PA-C  docusate sodium (COLACE) 100 MG capsule Take 1 capsule (100 mg total) by mouth 2 (two) times daily as needed for mild constipation. Patient taking differently: Take 100 mg by mouth daily.  03/20/18  Yes Clegg, Amy D, NP  furosemide (LASIX) 20 MG tablet Take 20 mg by mouth daily.   Yes [provider]  ipratropium-albuterol (DUONEB) 0.5-2.5 (3) MG/3ML SOLN Take 3 mLs by nebulization 2 (two) times daily. 02/25/18  Yes Gold, Wayne E, PA-C  ivabradine (CORLANOR) 7.5 MG TABS tablet Take 1 tablet (7.5 mg total) by mouth 2 (two) times daily with a meal. 04/25/18  Yes Clegg, Amy D, NP  losartan (COZAAR) 25 MG tablet Take 0.5 tablets (12.5 mg total) by mouth daily. 04/25/18  Yes Clegg, Amy D, NP  oxyCODONE (OXY IR/ROXICODONE) 5 MG immediate release tablet Take 1-2 tablets (5-10 mg total) by mouth every 12 (twelve) hours as needed for severe pain. Patient taking differently: Take 5-10 mg by mouth every 6 (six) hours as needed (pain).  04/25/18  Yes Clegg, Amy D, NP  spironolactone (ALDACTONE) 25 MG tablet Take 0.5 tablets (12.5 mg total) by mouth daily. 03/20/18  Yes Clegg, Amy D, NP    Physical Exam: Vitals:   05/04/18  2330 05/04/18 2345 05/04/18 2350 05/05/18 0000  BP: (!) 87/60 90/61  91/61  Pulse: 79 84  79  Resp: 14 (!) 21  18  Temp:   98.3 F (36.8 C)   TempSrc:   Oral   SpO2: 100% 93%  (!) 88%  Weight:      Height:          Constitutional: NAD, calm  Eyes: PERTLA, lids and conjunctivae normal ENMT: Mucous membranes are moist. Posterior pharynx clear of any exudate or lesions.   Neck: normal, supple, no masses, no thyromegaly Respiratory: Mild tachypnea, dyspnea with speech, diminished on right, rales bilaterally. No accessory muscle use.  Cardiovascular: S1 & S2 heard, regular rate and rhythm. No extremity edema.   Abdomen: No distension, no tenderness, soft. Bowel sounds normal.  Musculoskeletal: no clubbing / cyanosis. No joint deformity upper and lower extremities.   Skin: no significant rashes, lesions, ulcers. Warm, dry, well-perfused. Neurologic: CN 2-12 grossly intact. Sensation intact. Strength 5/5 in all 4 limbs.  Psychiatric: Alert and oriented x 3. Calm, cooperative.     Labs on Admission: I have personally reviewed following labs and imaging studies  CBC: Recent Labs  Lab 05/04/18 2016 05/04/18 2029  WBC 14.1*  --   NEUTROABS 11.1*  --   HGB 11.4* 13.3  HCT 38.3 39.0  MCV 91.6  --   PLT 410*  --    Basic Metabolic Panel: Recent Labs  Lab 05/02/18 1126 05/04/18 2029  NA 141 138  K 4.3 4.7  CL 108 105  CO2 25  --   GLUCOSE 87 169*  BUN 18 19  CREATININE 0.86 0.90  CALCIUM 9.7  --    GFR: Estimated Creatinine Clearance: 48.3 mL/min (by C-G formula based on SCr of 0.9 mg/dL). Liver Function Tests: No results for input(s): AST, ALT, ALKPHOS, BILITOT, PROT, ALBUMIN in the last 168 hours. No results for input(s): LIPASE, AMYLASE in the last 168 hours. No results for input(s): AMMONIA in the last 168 hours. Coagulation Profile: No results for input(s): INR, PROTIME in the last 168 hours. Cardiac Enzymes: No results for input(s): CKTOTAL, CKMB, CKMBINDEX,  TROPONINI in the last 168 hours. BNP (last 3 results) No results for input(s): PROBNP in the last 8760 hours. HbA1C: No results for input(s): HGBA1C in the last 72 hours. CBG: No results for input(s): GLUCAP in the last 168 hours. Lipid Profile: No results for input(s): CHOL, HDL, LDLCALC, TRIG, CHOLHDL, LDLDIRECT in the last 72 hours. Thyroid Function Tests: No results for input(s): TSH, T4TOTAL, FREET4, T3FREE, THYROIDAB in the last  72 hours. Anemia Panel: No results for input(s): VITAMINB12, FOLATE, FERRITIN, TIBC, IRON, RETICCTPCT in the last 72 hours. Urine analysis:    Component Value Date/Time   COLORURINE YELLOW 02/27/2018 1255   APPEARANCEUR HAZY (A) 02/27/2018 1255   LABSPEC 1.014 02/27/2018 1255   PHURINE 8.0 02/27/2018 1255   GLUCOSEU NEGATIVE 02/27/2018 1255   HGBUR NEGATIVE 02/27/2018 1255   BILIRUBINUR NEGATIVE 02/27/2018 1255   KETONESUR NEGATIVE 02/27/2018 1255   PROTEINUR NEGATIVE 02/27/2018 1255   UROBILINOGEN 1.0 03/15/2015 1117   NITRITE NEGATIVE 02/27/2018 1255   LEUKOCYTESUR SMALL (A) 02/27/2018 1255   Sepsis Labs: @LABRCNTIP (procalcitonin:4,lacticidven:4) )No results found for this or any previous visit (from the past 240 hour(s)).   Radiological Exams on Admission: Dg Chest Portable 1 View  Result Date: 05/04/2018 CLINICAL DATA:  Respiratory distress; chest pain EXAM: PORTABLE CHEST 1 VIEW COMPARISON:  May 12 19 FINDINGS: There is a moderate pleural effusion on the right with consolidation in portions of the right middle and lower lobes. There is underlying mild interstitial pulmonary edema. Heart is mildly enlarged with pulmonary vascularity within normal limits. Patient is status post coronary artery bypass grafting. No. No evident lesions. IMPRESSION: Moderate pleural effusion on the right with consolidation in portions of the right middle and lower lobes. Mild interstitial edema. Cardiac prominence. Postoperative change noted. Electronically Signed    By: Lowella Grip III M.D.   On: 05/04/2018 20:34    EKG: Independently reviewed. Sinus tachycardia (rate 130).   Assessment/Plan  1. Acute on chronic systolic CHF  - Presents with 2 days of worsening SOB and chest pain  - Found to have pulmonary edema on CXR; wt appears stable  - Treated in ED with Lasix 40 mg IV and she has begun to diurese  - Echo done earlier this month with EF 20%; she is followed by heart failure clinic  - Continue cardiac monitoring, continue diuresis with IV Lasix, conitnue ARB, digoxin, ivabradine, follow daily wt and I/O's    2. HCAP  - CXR with recurrent right pleural effusion and associated consolidation in RML and RLL, possibly reflecting pneumonia  - There is a leukocytosis to 14k, but no fever and pt denies productive cough  - She was treated with vancomycin and Zosyn in ED  - Check sputum culture, strep pneumo antigen, procalcitonin, and continue empiric abx with vancomycin and cefepime    3. Recurrent right pleural effusion  - Presents with respiratory distress, likely multifactorial  - ED workup reveals recurrent right pleural effusion with associated consolidations  - IR consulted for thoracentesis   4. Hx of lung cancer  - Status-post radiation and chemotherapy in 2015  - Recent thoracentesis with negative cytology  - Planned for outpatient surveillance CT in 6 mos    5. Asthma  - No wheezing on admission  - Continue nebs as needed    DVT prophylaxis: Lovenox  Code Status: Full  Family Communication: Discussed with patient Consults called: Cardiology Admission status: Inpatient    Vianne Bulls, MD [facility] Pager [phone]  If 7PM-7AM, please contact night-coverage [email or web address] Password [email or web address]  05/05/2018, 1:01 AM

## 2018-05-05 NOTE — Progress Notes (Signed)
[facility]                                                                              Patient Demographics  Brittany [name], is a 57 y.o. female, DOB - 1961/07/19, VQM:[id number]  Admit date - 05/04/2018   Admitting Physician Vianne Bulls, MD  Outpatient Primary MD for the patient is System, Pcp Not In  Outpatient specialists:   LOS - 0  days   Medical records reviewed and are as summarized below:    Chief Complaint  Patient presents with  . Respiratory Distress       Brief summary   Patient is a 57 year old female with chronic systolic CHF with EF 93%, CAD status post CABG paroxysmal A. fib not on anticoagulation, chronic pain presented with progressive shortness of breath.  Patient was recently discharged on 5/16 after management of acute on chronic CHF.  Patient reported insidious 12 mental worsening dyspnea in the last 2 days, symptomatic at rest on the day of admission.  In ED tachycardiac up to 130 and BP 88/60.  Patient was placed on BiPAP in ED.  Chest x-ray showed moderate right pleural effusion, consolidation in right middle and lower lobes, mild interstitial edema.  Patient was placed on vancomycin and Zosyn, cardiology was consulted, admitted for further work-up.  Assessment & Plan    Principal Problem: Acute on chronic hypoxic respiratory failure secondary to acute on chronic systolic CHF (congestive heart failure) (HCC) and H CAP, right-sided pleural effusion -Treated in ED with loss Lasix 40 mg IV.  2D echo recently showed EF of 20% -Cardiology consulted, continue IV Lasix, digoxin, ivabradine, ARB -N.p.o. for thoracentesis today, will follow studies -Strict I's and O's and daily weights.  Active Problems: HCAP (healthcare associated pneumonia) -Chest x-ray showed right pleural effusion with associated consolidation in right middle lobe, right lower lung -Continue IV vancomycin and cefepime    Recurrent pleural effusion on  right -Status post thoracentesis, will follow fluid studies    HTN (hypertension) -Currently stable, continue Lasix, ARB   History of lung CA -Small cell, status post radiation and chemo in 2015 -Recent thoracentesis with negative cytology, outpatient surveillance CT in 6 months  Asthma Currently off BiPAP and no wheezing at the time of my-encounter  Paroxysmal atrial fibrillation -History of prior A. fib RVR in the past in the setting of lung CA, was on Xarelto in the past but stopped her medications -Continue aspirin and Plavix, per cardiology no anti-widely with history of lung CA.  Code Status: Full code DVT Prophylaxis:  Lovenox Family Communication: Discussed in detail with the patient, all imaging results, lab results explained to the patient or *   Disposition Plan: When medically ready  Time Spent in minutes 35 minutes  Procedures:  None  Consultants:   Cardiology   Antimicrobials:   IV vancomycin 5/25 IV cefepime 5/25  Medications  Scheduled Meds: . aspirin EC  81 mg Oral Daily  . atorvastatin  80 mg Oral q1800  . clopidogrel  75 mg Oral Daily  . digoxin  0.0625 mg Oral Daily  . enoxaparin (LOVENOX) injection  40 mg Subcutaneous  Q24H  . furosemide  40 mg Intravenous Q12H  . ipratropium-albuterol  3 mL Nebulization BID  . ivabradine  7.5 mg Oral BID WC  . lidocaine (PF)      . losartan  12.5 mg Oral Daily  . sodium chloride flush  3 mL Intravenous Q12H  . spironolactone  12.5 mg Oral Daily   Continuous Infusions: . sodium chloride    . ceFEPime (MAXIPIME) IV Stopped (05/05/18 1025)  . vancomycin     PRN Meds:.sodium chloride, acetaminophen, albuterol, docusate sodium, ondansetron (ZOFRAN) IV, oxyCODONE, sodium chloride flush   Antibiotics   Anti-infectives (From admission, onward)   Start     Dose/Rate Route Frequency Ordered Stop   05/05/18 2300  vancomycin (VANCOCIN) IVPB 1000 mg/200 mL premix     1,000 mg 200 mL/hr over 60 Minutes  Intravenous Every 24 hours 05/05/18 0124 05/12/18 2259   05/05/18 0230  ceFEPIme (MAXIPIME) 1 g in sodium chloride 0.9 % 100 mL IVPB     1 g 200 mL/hr over 30 Minutes Intravenous Every 12 hours 05/05/18 0101 05/12/18 2159   05/04/18 2245  vancomycin (VANCOCIN) IVPB 1000 mg/200 mL premix     1,000 mg 200 mL/hr over 60 Minutes Intravenous  Once 05/04/18 2244 05/05/18 0022   05/04/18 2115  piperacillin-tazobactam (ZOSYN) IVPB 3.375 g     3.375 g 100 mL/hr over 30 Minutes Intravenous  Once 05/04/18 2115 05/04/18 2229        Subjective:   Brittany [name] was seen and examined today.  Off the BiPAP, feeling somewhat better. Patient denies dizziness, chest pain, abdominal pain, N/V/D/C, new weakness, numbess, tingling.    Objective:   Vitals:   05/05/18 1000 05/05/18 1015 05/05/18 1055 05/05/18 1103  BP: 115/78 112/77 108/77 106/70  Pulse: 88 89    Resp: (!) 21 (!) 21    Temp:      TempSrc:      SpO2: 100% 100%    Weight:      Height:        Intake/Output Summary (Last 24 hours) at 05/05/2018 1146 Last data filed at 05/05/2018 3154 Gross per 24 hour  Intake 384.96 ml  Output -  Net 384.96 ml     Wt Readings from Last 3 Encounters:  05/04/18 45.8 kg (101 lb)  05/02/18 46.2 kg (101 lb 12.8 oz)  04/25/18 46.1 kg (101 lb 10.1 oz)     Exam  General: Alert and oriented x 3, NAD, off BiPAP  Eyes:   HEENT:  Atraumatic, normocephalic, normal oropharynx  Cardiovascular: S1 S2 auscultated, 2/6 systolic murmur regular rate and rhythm.  Respiratory: Bibasilar crackles with coarse breath sounds  Gastrointestinal: Soft, nontender, nondistended, + bowel sounds  Ext: no pedal edema bilaterally  Neuro: no new deficits  Musculoskeletal: No digital cyanosis, clubbing  Skin: No rashes  Psych: Normal affect and demeanor, alert and oriented x3    Data Reviewed:  I have personally reviewed following labs and imaging studies  Micro Results Recent Results (from the  past 240 hour(s))  Blood culture (routine x 2)     Status: None (Preliminary result)   Collection Time: 05/04/18  9:48 PM  Result Value Ref Range Status   Specimen Description BLOOD RIGHT FOREARM  Final   Special Requests   Final    BOTTLES DRAWN AEROBIC ONLY Blood Culture adequate volume   Culture   Final    NO GROWTH < 12 HOURS Performed at [facility], [address], [place], [place] [address]    Report Status PENDING  Incomplete  Blood culture (routine x 2)     Status: None (Preliminary result)   Collection Time: 05/04/18  9:50 PM  Result Value Ref Range Status   Specimen Description BLOOD LEFT ARM  Final   Special Requests   Final    BOTTLES DRAWN AEROBIC AND ANAEROBIC Blood Culture adequate volume   Culture   Final    NO GROWTH < 12 HOURS Performed at [facility], [place] [address], [place], [place] [address]    Report Status PENDING  Incomplete    Radiology Reports Dg Chest 1 View  Result Date: 05/05/2018 CLINICAL DATA:  Status post thoracentesis. EXAM: CHEST  1 VIEW COMPARISON:  05/04/2018. FINDINGS: Cardiac enlargement. Status post CABG procedure. Significant decrease in volume of right pleural effusion. No pneumothorax status post thoracentesis. IMPRESSION: 1. No complications identified status post right thoracentesis. Electronically Signed   By: Kerby Moors M.D.   On: 05/05/2018 11:36   Dg Chest 1 View  Result Date: 04/21/2018 CLINICAL DATA:  Status post right thoracentesis and biopsy today. EXAM: CHEST  1 VIEW COMPARISON:  CT chest 04/20/2017. Single-view of the chest 04/19/2017. FINDINGS: Right pleural effusion is markedly decreased after thoracentesis. No pneumothorax. No left effusion. There is cardiomegaly. Right PICC is now in place with the tip projecting in the mid to lower superior vena cava. Collapse of the right middle lobe is unchanged. Lungs are emphysematous. IMPRESSION: Marked decrease in right pleural effusion after thoracentesis. Negative  for pneumothorax or other new abnormality. No change in right middle lobe atelectasis. Emphysema. Cardiomegaly. Electronically Signed   By: Inge Rise M.D.   On: 04/21/2018 15:52   Dg Chest 2 View  Result Date: 04/08/2018 CLINICAL DATA:  Status post coronary artery bypass graft. EXAM: CHEST - 2 VIEW COMPARISON:  Radiographs of February 20, 2018. FINDINGS: Stable cardiomegaly. Status post coronary bypass graft. No pneumothorax is noted. Left lung is clear. Minimal right basilar subsegmental atelectasis or scarring is noted with minimal pleural effusion. Bony thorax is unremarkable. IMPRESSION: Minimal right basilar subsegmental atelectasis or scarring with minimal right pleural effusion. Electronically Signed   By: Marijo Conception, M.D.   On: 04/08/2018 14:47   Ct Chest W Contrast  Result Date: 04/21/2018 CLINICAL DATA:  Shortness of breath. History of small cell lung cancer, myocardial infarction, hypertension. EXAM: CT CHEST WITH CONTRAST TECHNIQUE: Multidetector CT imaging of the chest was performed during intravenous contrast administration. CONTRAST:  103mL OMNIPAQUE IOHEXOL 300 MG/ML  SOLN COMPARISON:  Chest radiograph Apr 19, 2018 and CT chest March 27, 2018 FINDINGS: CARDIOVASCULAR: The heart is mildly enlarged and unchanged. No pericardial effusion. Status post median sternotomy for CABG. Stable anterior superiorly directed 17 mm contrast filled out patching from anterior ascending aorta terminating at surgical clips with possible nonenhancing component superiorly (sagittal image 51). Mild calcific atherosclerosis aortic arch, patent arch vessel origins. No central pulmonary embolism though not tailored for evaluation. MEDIASTINUM/NODES: RIGHT hilar and subcarinal lymphadenopathy contiguous RIGHT hilar consolidation. RIGHT PICC. LUNGS/PLEURA: Tracheobronchial tree is patent, no pneumothorax. Hypoenhancing wedge-like consolidation RIGHT middle lobe with enhancing RIGHT lower lobe atelectasis.  Enlarging RIGHT perihilar amorphous consolidation. Increasing moderate RIGHT pleural effusion without abnormal pleural enhancement. Stable moderate centrilobular emphysema. UPPER ABDOMEN: Nonacute. MUSCULOSKELETAL: Nonacute. Subcentimeter RIGHT thyroid nodule below size followup recommendations. IMPRESSION: 1. Increasing moderate RIGHT pleural effusion, malignant etiology possible. 2. Increasing RIGHT hilar consolidation (differential diagnosis includes pneumonia, post treatment related changes or  recurrent tumor). RIGHT middle lobe atelectasis versus pneumonia. 3. 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 Recommend cardiothoracic surgery consultation. Aortic Atherosclerosis (ICD10-I70.0) and Emphysema (ICD10-J43.9). Electronically Signed   By: Elon Alas M.D.   On: 04/21/2018 00:23   Dg Chest Portable 1 View  Result Date: 05/04/2018 CLINICAL DATA:  Respiratory distress; chest pain EXAM: PORTABLE CHEST 1 VIEW COMPARISON:  May 12 19 FINDINGS: There is a moderate pleural effusion on the right with consolidation in portions of the right middle and lower lobes. There is underlying mild interstitial pulmonary edema. Heart is mildly enlarged with pulmonary vascularity within normal limits. Patient is status post coronary artery bypass grafting. No. No evident lesions. IMPRESSION: Moderate pleural effusion on the right with consolidation in portions of the right middle and lower lobes. Mild interstitial edema. Cardiac prominence. Postoperative change noted. Electronically Signed   By: Lowella Grip III M.D.   On: 05/04/2018 20:34   Dg Chest Port 1 View  Result Date: 04/19/2018 CLINICAL DATA:  Recent worsening of dyspnea. EXAM: PORTABLE CHEST 1 VIEW COMPARISON:  04/08/2018. FINDINGS: The cardiac silhouette is markedly increased, possible pericardial effusion. Increasing RIGHT pleural effusion as well. Consolidation or atelectasis at the RIGHT base.  Median sternotomy for CABG. IMPRESSION: Worsening aeration and increasing size of the cardiac silhouette. See discussion above. Electronically Signed   By: Staci Righter M.D.   On: 04/19/2018 16:35   [place] Ekg Site Rite  Result Date: 04/19/2018 If Site Rite image not attached, placement could not be confirmed due to current cardiac rhythm.  US Thoracentesis Asp Pleural Space W/img Guide  Result Date: 04/23/2018 CLINICAL DATA:  57 year old female with pleural effusion EXAM: ULTRASOUND GUIDED THORACENTESIS COMPARISON:  None. PROCEDURE: An ultrasound guided thoracentesis was thoroughly discussed with the patient and questions answered. The benefits, risks, alternatives and complications were also discussed. The patient understands and wishes to proceed with the procedure. Written consent was obtained. Ultrasound was performed to localize and mark an adequate pocket of fluid in the right chest. The area was then prepped and draped in the normal sterile fashion. 1% Lidocaine was used for local anesthesia. Under ultrasound guidance a 19 gauge Yueh catheter was introduced. Thoracentesis was performed. The catheter was removed and a dressing applied. Complications:  None FINDINGS: A total of approximately 480cc of thin yellow fluid was removed. IMPRESSION: Status post US guided right thoracentesis. Signed, Dulcy Fanny. Earleen Newport, DO [facility] Electronically Signed   By: Corrie Mckusick D.O.   On: 04/23/2018 07:35    Lab Data:  CBC: Recent Labs  Lab 05/04/18 2016 05/04/18 2029 05/05/18 0142  WBC 14.1*  --  14.5*  NEUTROABS 11.1*  --  13.1*  HGB 11.4* 13.3 10.2*  HCT 38.3 39.0 33.2*  MCV 91.6  --  88.3  PLT 410*  --  354   Basic Metabolic Panel: Recent Labs  Lab 05/02/18 1126 05/04/18 2029  NA 141 138  K 4.3 4.7  CL 108 105  CO2 25  --   GLUCOSE 87 169*  BUN 18 19  CREATININE 0.86 0.90  CALCIUM 9.7  --    GFR: Estimated Creatinine  Clearance: 48.3 mL/min (by C-G formula based on SCr of 0.9 mg/dL). Liver Function Tests: Recent Labs  Lab 05/05/18 0142  AST 35  ALT 45  ALKPHOS 80  BILITOT 0.9  PROT 6.7  ALBUMIN 3.4*   No results for input(s): LIPASE, AMYLASE in  the last 168 hours. No results for input(s): AMMONIA in the last 168 hours. Coagulation Profile: No results for input(s): INR, PROTIME in the last 168 hours. Cardiac Enzymes: No results for input(s): CKTOTAL, CKMB, CKMBINDEX, TROPONINI in the last 168 hours. BNP (last 3 results) No results for input(s): PROBNP in the last 8760 hours. HbA1C: No results for input(s): HGBA1C in the last 72 hours. CBG: No results for input(s): GLUCAP in the last 168 hours. Lipid Profile: No results for input(s): CHOL, HDL, LDLCALC, TRIG, CHOLHDL, LDLDIRECT in the last 72 hours. Thyroid Function Tests: No results for input(s): TSH, T4TOTAL, FREET4, T3FREE, THYROIDAB in the last 72 hours. Anemia Panel: No results for input(s): VITAMINB12, FOLATE, FERRITIN, TIBC, IRON, RETICCTPCT in the last 72 hours. Urine analysis:    Component Value Date/Time   COLORURINE YELLOW 02/27/2018 1255   APPEARANCEUR HAZY (A) 02/27/2018 1255   LABSPEC 1.014 02/27/2018 1255   PHURINE 8.0 02/27/2018 1255   GLUCOSEU NEGATIVE 02/27/2018 1255   HGBUR NEGATIVE 02/27/2018 1255   BILIRUBINUR NEGATIVE 02/27/2018 1255   KETONESUR NEGATIVE 02/27/2018 1255   PROTEINUR NEGATIVE 02/27/2018 1255   UROBILINOGEN 1.0 03/15/2015 1117   NITRITE NEGATIVE 02/27/2018 1255   LEUKOCYTESUR SMALL (A) 02/27/2018 1255     Ripudeep Rai M.D. [facility] 05/05/2018, 11:46 AM  Pager: [phone] Between 7am to 7pm - call Pager - [phone]  After 7pm go to [email or web address] - password [email or web address]  Call night coverage person covering after 7pm

## 2018-05-05 NOTE — Progress Notes (Signed)
RT came to see patient as she just came up from ED and had required bipap earlier when in ED.  Patient is sitting in bed, resting comfortably, no respiratory distress. Patient is on 2L [place] with sats of 100%. Patient just had thoracentesis and says breathing feels much better since. Vitals are stable. RT will continue to monitor.

## 2018-05-05 NOTE — ED Notes (Signed)
Requested hospital bed from [facility] 

## 2018-05-05 NOTE — Evaluation (Signed)
Clinical/Bedside Swallow Evaluation Patient Details  Name: Brittany [name] MRN: [id number] Date of Birth: 1961-01-24  Today's Date: 05/05/2018 Time: SLP Start Time (ACUTE ONLY): 6160 SLP Stop Time (ACUTE ONLY): 1430 SLP Time Calculation (min) (ACUTE ONLY): 25 min  Past Medical History:  Past Medical History:  Diagnosis Date  . Acute systolic congestive heart failure ([place]) 02/03/2018  . Allergy   . Anxiety   . Asthma   . Hypertension   . PAF (paroxysmal atrial fibrillation) ([place])   . Prophylactic measure 08/03/14-08/19/14   Prophyl. cranial radiation 24 Gy  . 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 Small cell lung cancer ([place]) 03/16/2014   Past Surgical History:  Past Surgical History:  Procedure Laterality Date  . CESAREAN SECTION    . CORONARY ARTERY BYPASS GRAFT N/A 02/03/2018   Procedure: CORONARY ARTERY BYPASS GRAFTING (CABG);  Surgeon: Rexene Alberts, MD;  Location: [facility] [facility];  Service: Open Heart Surgery;  Laterality: N/A;  Time 3 using left internal mammary artery and endoscopically harvested right saphenous vein  . CORONARY BALLOON ANGIOPLASTY N/A 02/03/2018   Procedure: CORONARY BALLOON ANGIOPLASTY;  Surgeon: Martinique, Peter M, MD;  Location: [facility] [facility];  Service: Cardiovascular;  Laterality: N/A;  . CORONARY/GRAFT ACUTE MI REVASCULARIZATION N/A 02/03/2018   Procedure: Coronary/Graft Acute MI Revascularization;  Surgeon: Martinique, Peter M, MD;  Location: [facility];  Service: Cardiovascular;  Laterality: N/A;  . IABP INSERTION N/A 02/03/2018   Procedure: IABP Insertion;  Surgeon: Martinique, Peter M, MD;  Location: [place] [facility];  Service: Cardiovascular;  Laterality: N/A;  . INTRAOPERATIVE TRANSESOPHAGEAL ECHOCARDIOGRAM N/A 02/03/2018   Procedure: INTRAOPERATIVE TRANSESOPHAGEAL ECHOCARDIOGRAM;  Surgeon: Rexene Alberts, MD;  Location: [place];   Service: Open Heart Surgery;  Laterality: N/A;  . LEFT HEART CATH AND CORONARY ANGIOGRAPHY N/A 02/03/2018   Procedure: LEFT HEART CATH AND CORONARY ANGIOGRAPHY;  Surgeon: Martinique, Peter M, MD;  Location: [place];  Service: Cardiovascular;  Laterality: N/A;  . MEDIASTINOSCOPY N/A 03/11/2014   Procedure: MEDIASTINOSCOPY;  Surgeon: Melrose Nakayama, MD;  Location: [facility];  Service: Thoracic;  Laterality: N/A;  . PLACEMENT OF [place] LEFT VENTRICULAR ASSIST DEVICE  02/03/2018   Procedure: PLACEMENT OF [place] LEFT VENTRICULAR ASSIST DEVICE LD;  Surgeon: Rexene Alberts, MD;  Location: [place];  Service: Open Heart Surgery;;  . REMOVAL OF IMPELLA LEFT VENTRICULAR ASSIST DEVICE N/A 02/08/2018   Procedure: REMOVAL OF [place] LEFT VENTRICULAR ASSIST DEVICE;  Surgeon: Rexene Alberts, MD;  Location: [facility] [facility];  Service: Open Heart Surgery;  Laterality: N/A;  . RIGHT HEART CATH N/A 02/03/2018   Procedure: RIGHT HEART CATH;  Surgeon: Martinique, Peter M, MD;  Location: [facility];  Service: Cardiovascular;  Laterality: N/A;  . TEE WITHOUT CARDIOVERSION N/A 02/08/2018   Procedure: TRANSESOPHAGEAL ECHOCARDIOGRAM (TEE);  Surgeon: Rexene Alberts, MD;  Location: [place];  Service: Open Heart Surgery;  Laterality: N/A;  . TUBAL LIGATION    . VIDEO BRONCHOSCOPY WITH ENDOBRONCHIAL ULTRASOUND N/A 03/11/2014   Procedure: VIDEO BRONCHOSCOPY WITH ENDOBRONCHIAL ULTRASOUND;  Surgeon: Melrose Nakayama, MD;  Location: [facility];  Service: Thoracic;  Laterality: N/A;   HPI:  Patient is a 57 year old female with chronic systolic CHF with EF 73%, CAD status post CABG paroxysmal A. fib not on anticoagulation, chronic pain presented with progressive shortness  of breath. Hx previous small cell lung cancer treated with chemo, chest XRT and prophylacticbrain radiation in 2015. Patient was recently discharged on 5/16 after management of acute on chronic CHF.  Patient was placed on BiPAP in ED.  Chest x-ray showed moderate right  pleural effusion, consolidation in right middle and lower lobes, mild interstitial edema.  Patient was placed on vancomycin and Zosyn, cardiology was consulted, admitted for further work-up. s/p thoracentesis yielding 900 mL of pleural fluid. Pt had 2 MBS during prior admission 02/2018 with similar findings of silent transient aspiration and mistiming of protective mechanisms; ? of recurrent larygneal nerve involvement s/p radiation for lung cancer. Ultimately upgraded from nectar to thin liquids with cough after liquids for airway clearance.   Assessment / Plan / Recommendation Clinical Impression   Patient presents with history of pharyngeal dysphagia; she requires cues to recall swallowing precautions recommended by ST during 02/2018 admission (cough after swallowing liquids) and states she has not been following this precaution. She admits, "sometimes I can tell things go down 2 different ways." SLP provided education re: risks for aspiration in the set of deconditioning with acute illness and decreased respiratory status. Pt tearful and expresses she does not want to be on thickened liquids. SLP discussed options with pt including nectar thick liquids at meals with thin water between meals, or continuing thin liquids but using compensatory cough after swallows of liquid. Pt strongly desires to continue thin liquids for quality of life, verbalizing understanding of aspiration risks. She refuses meds whole in puree. SLP reinforced swallow precautions and encouraged pt to complete oral care BEFORE and after meals to minimize aspiration risks. Pt demonstrates volitional cough after liquids consistently after initial instruction. Continue current diet (regular with thin liquids), no straws, single sips of liquid with hard cough after swallow; will follow acutely. Pt may benefit from f/u from SLP in next venue of care for further training in aspiration precautions and possibly interventions to target hyolaryngeal  strengthening.     SLP Visit Diagnosis: Dysphagia, pharyngeal phase (R13.13)    Aspiration Risk  Moderate aspiration risk    Diet Recommendation Regular;Thin liquid   Liquid Administration via: Cup;Straw Medication Administration: Whole meds with liquid(cough after liquids) Supervision: Patient able to self feed Compensations: Hard cough after swallow;Slow rate;Small sips/bites    Other  Recommendations Oral Care Recommendations: Oral care before and after PO   Follow up Recommendations Home health SLP      Frequency and Duration min 2x/week  2 weeks       Prognosis Prognosis for Safe Diet Advancement: Good Barriers to Reach Goals: Other (Comment)(? recurrent laryngeal nerve pathology)      Swallow Study   General Date of Onset: 05/04/18 HPI: Patient is a 57 year old female with chronic systolic CHF with EF 02%, CAD status post CABG paroxysmal A. fib not on anticoagulation, chronic pain presented with progressive shortness of breath. Hx previous small cell lung cancer treated with chemo, chest XRT and prophylacticbrain radiation in 2015. Patient was recently discharged on 5/16 after management of acute on chronic CHF.  Patient was placed on BiPAP in ED.  Chest x-ray showed moderate right pleural effusion, consolidation in right middle and lower lobes, mild interstitial edema.  Patient was placed on vancomycin and Zosyn, cardiology was consulted, admitted for further work-up. s/p thoracentesis yielding 900 mL of pleural fluid. Pt had 2 MBS during prior admission 02/2018 with similar findings of silent transient aspiration and mistiming of protective mechanisms; ? of recurrent larygneal  nerve involvement s/p radiation for lung cancer. Ultimately upgraded from nectar to thin liquids with cough after liquids for airway clearance. Type of Study: Bedside Swallow Evaluation Previous Swallow Assessment: see HPI Diet Prior to this Study: Regular;Thin liquids Temperature Spikes Noted:  Yes(slight 99.1) Respiratory Status: Nasal cannula History of Recent Intubation: No Behavior/Cognition: Alert;Cooperative Oral Cavity Assessment: Within Functional Limits Oral Care Completed by SLP: No Vision: Functional for self-feeding Self-Feeding Abilities: Able to feed self Patient Positioning: Upright in bed Baseline Vocal Quality: Hoarse;Suspected CN X (Vagus) involvement Volitional Cough: Strong Volitional Swallow: Able to elicit    Oral/Motor/Sensory Function Overall Oral Motor/Sensory Function: Within functional limits   Ice Chips     Thin Liquid Thin Liquid: Within functional limits(with volitional cough) Presentation: Cup    Nectar Thick Nectar Thick Liquid: Not tested   Honey Thick Honey Thick Liquid: Not tested   Puree Puree: Not tested   Solid   GO   Solid: Within functional limits Presentation: Spoon;Self [place], [place], CCC-SLP Speech-Language Pathologist [phone]  Aliene Altes 05/05/2018,3:01 PM

## 2018-05-05 NOTE — Procedures (Signed)
PROCEDURE SUMMARY:  Successful US guided diagnostic and therapeutic thoracentesis. Yielded 900 mL of amber fluid. Pt tolerated procedure well. No immediate complications.  Specimen was sent for labs. CXR ordered.  Docia Barrier PA-C 05/05/2018 11:19 AM

## 2018-05-05 NOTE — ED Notes (Signed)
Patient out for procedure.

## 2018-05-05 NOTE — Progress Notes (Signed)
Pharmacy Antibiotic Note  Brittany [name] is a 57 y.o. female admitted on 05/04/2018 with pneumonia.  Pharmacy has been consulted for vancomycin dosing.  Plan: Vancomycin 1000 mg IV every 24 hours.  Goal trough 15-20 mcg/mL.  X 8 days  Height: 4' 11.5" (151.1 cm) Weight: 101 lb (45.8 kg) IBW/kg (Calculated) : 44.35  Temp (24hrs), Avg:98.3 F (36.8 C), Min:98.3 F (36.8 C), Max:98.3 F (36.8 C)  Recent Labs  Lab 05/02/18 1126 05/04/18 2016 05/04/18 2029  WBC  --  14.1*  --   CREATININE 0.86  --  0.90    Estimated Creatinine Clearance: 48.3 mL/min (by C-G formula based on SCr of 0.9 mg/dL).    Allergies  Allergen Reactions  . Iodinated Diagnostic Agents Hives and Rash    Needs 13-hour prep before CT scans w/ contrast.  Can do Benadryl 50mg  PO for ESI's, etc  . Latex Hives and Other (See Comments)    Skin irritation  . Pineapple Hives  . Sulfa Antibiotics Hives and Rash  . Codeine Nausea And Vomiting    Thank you for allowing [place] to participate in this patients care.  Jens Som, PharmD Main pharmacy at: [phone] 05/05/2018 1:18 AM

## 2018-05-05 NOTE — Consult Note (Signed)
Cardiology Consultation:   Patient ID: Brittany [name]; [id number]; [birth year]   Admit date: 05/04/2018 Date of Consult: 05/05/2018  Primary Care Provider: System, Pcp Not In Primary Cardiologist: Bens[place]  Patient Profile:   Brittany [name] is a 57 y.o. female with a hx of CAD c/b anterior STEMI (01/2018) c/b cardiogenic shock requiring emergent CABG,  severe ICM/HFrEF (EF 20%), pAF, AI, h/o SCLC, asthma/COPD who is being seen today for the evaluation of dyspena at the request of Dr. Melina Copa.  History of Present Illness:   Brittany [name] is a 57 y.o. female with a hx of CAD c/b anterior STEMI (01/2018) c/b cardiogenic shock requiring emergent CABG,  severe ICM/HFrEF (EF 20%), pAF, AI, h/o SCLC, asthma/COPD who is admitted with dyspnea.   The patient has a complex cardiac history with severe ICM following STEMI c/b cardiogenic shock. She was most recently hospitalized 5/10-16/19 with acute respiratory failure and concern for low output state. Her respiratory status improved with BiPAP. Workup was notable for R pleural effusion that required thoracentesis. She was discharged to SNF on O2. The patient follows in clinic with Dr. [name] and was last seen on 5/22, at which time she had some cough and dyspnea.   She presented to the ED from her SNF today with worsening dyspnea. In the ED, pt was initially hypertensive with SBP in the 180s. WBC 14.1, procalcitonin 1.38. VBG 7.2/57. Chemistry and lactate wnl. Troponin negative x1. CXR showed recurrent R pleural effusion and mild pulmonary edema. ECG showed sinus tachycardia and non-specific ST changes. She was given IV furosemide and started on a nitroglycerin gtt, which was stopped when SBP decreased to 80s. Due to concern for possible PNA, she was started on broad spectrum antibiotics. She was admitted to the hospitalist service, and cardiology was consulted.  On my evaluation, the patient is able to come off of BiPAP. She  reports improvement of her respiratory status with her ED treatment. She states that she had recent chest discomfort and dyspnea and felt that she could have stayed at her SNF if she received BiPAP there.   Past Medical History:  Diagnosis Date  . Acute systolic congestive heart failure ([place]) 02/03/2018  . Allergy   . Anxiety   . Asthma   . Hypertension   . PAF (paroxysmal atrial fibrillation) ([place])   . Prophylactic measure 08/03/14-08/19/14   Prophyl. cranial radiation 24 Gy  . 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 Small cell lung cancer ([place]) 03/16/2014    Past Surgical History:  Procedure Laterality Date  . CESAREAN SECTION    . CORONARY ARTERY BYPASS GRAFT N/A 02/03/2018   Procedure: CORONARY ARTERY BYPASS GRAFTING (CABG);  Surgeon: Rexene Alberts, MD;  Location: [place];  Service: Open Heart Surgery;  Laterality: N/A;  Time 3 using left internal mammary artery and endoscopically harvested right saphenous vein  . CORONARY BALLOON ANGIOPLASTY N/A 02/03/2018   Procedure: CORONARY BALLOON ANGIOPLASTY;  Surgeon: Martinique, Peter M, MD;  Location: [place] [facility];  Service: Cardiovascular;  Laterality: N/A;  . CORONARY/GRAFT ACUTE MI REVASCULARIZATION N/A 02/03/2018   Procedure: Coronary/Graft Acute MI Revascularization;  Surgeon: Martinique, Peter M, MD;  Location: [place] [facility];  Service: Cardiovascular;  Laterality: N/A;  . IABP INSERTION N/A 02/03/2018   Procedure: IABP Insertion;  Surgeon: Martinique, Peter M, MD;  Location: [place]  [facility];  Service: Cardiovascular;  Laterality: N/A;  . INTRAOPERATIVE TRANSESOPHAGEAL ECHOCARDIOGRAM N/A 02/03/2018   Procedure: INTRAOPERATIVE TRANSESOPHAGEAL ECHOCARDIOGRAM;  Surgeon: Rexene Alberts, MD;  Location: [place];  Service: Open Heart Surgery;  Laterality: N/A;  . LEFT HEART CATH AND CORONARY ANGIOGRAPHY N/A 02/03/2018   Procedure:  LEFT HEART CATH AND CORONARY ANGIOGRAPHY;  Surgeon: Martinique, Peter M, MD;  Location: [place] [facility];  Service: Cardiovascular;  Laterality: N/A;  . MEDIASTINOSCOPY N/A 03/11/2014   Procedure: MEDIASTINOSCOPY;  Surgeon: Melrose Nakayama, MD;  Location: [place];  Service: Thoracic;  Laterality: N/A;  . PLACEMENT OF [place] LEFT VENTRICULAR ASSIST DEVICE  02/03/2018   Procedure: PLACEMENT OF [place] LEFT VENTRICULAR ASSIST DEVICE LD;  Surgeon: Rexene Alberts, MD;  Location: [place];  Service: Open Heart Surgery;;  . REMOVAL OF IM[place] LEFT VENTRICULAR ASSIST DEVICE N/A 02/08/2018   Procedure: REMOVAL OF [place] LEFT VENTRICULAR ASSIST DEVICE;  Surgeon: Rexene Alberts, MD;  Location: [place];  Service: Open Heart Surgery;  Laterality: N/A;  . RIGHT HEART CATH N/A 02/03/2018   Procedure: RIGHT HEART CATH;  Surgeon: Martinique, Peter M, MD;  Location: [place] [facility];  Service: Cardiovascular;  Laterality: N/A;  . TEE WITHOUT CARDIOVERSION N/A 02/08/2018   Procedure: TRANSESOPHAGEAL ECHOCARDIOGRAM (TEE);  Surgeon: Rexene Alberts, MD;  Location: [place];  Service: Open Heart Surgery;  Laterality: N/A;  . TUBAL LIGATION    . VIDEO BRONCHOSCOPY WITH ENDOBRONCHIAL ULTRASOUND N/A 03/11/2014   Procedure: VIDEO BRONCHOSCOPY WITH ENDOBRONCHIAL ULTRASOUND;  Surgeon: Melrose Nakayama, MD;  Location: [place];  Service: Thoracic;  Laterality: N/A;     Home Medications:  Prior to Admission medications   Medication Sig Start Date End Date Taking? Authorizing Provider  aspirin EC 81 MG EC tablet Take 1 tablet (81 mg total) by mouth daily. 02/25/18  Yes Gold, Wayne E, PA-C  atorvastatin (LIPITOR) 80 MG tablet Take 1 tablet (80 mg total) by mouth daily at 6 PM. 02/25/18  Yes Gold, Wilder Glade, PA-C  clopidogrel (PLAVIX) 75 MG tablet Take 1 tablet (75 mg total) by mouth daily. 02/25/18  Yes Gold, Wayne E, PA-C  digoxin 62.5 MCG TABS Take 0.0625 mg by mouth daily. 02/25/18  Yes Gold, Wayne E, PA-C  docusate sodium (COLACE) 100 MG  capsule Take 1 capsule (100 mg total) by mouth 2 (two) times daily as needed for mild constipation. Patient taking differently: Take 100 mg by mouth daily.  03/20/18  Yes Clegg, Amy D, NP  furosemide (LASIX) 20 MG tablet Take 20 mg by mouth daily.   Yes [provider]  ipratropium-albuterol (DUONEB) 0.5-2.5 (3) MG/3ML SOLN Take 3 mLs by nebulization 2 (two) times daily. 02/25/18  Yes Gold, Wayne E, PA-C  ivabradine (CORLANOR) 7.5 MG TABS tablet Take 1 tablet (7.5 mg total) by mouth 2 (two) times daily with a meal. 04/25/18  Yes Clegg, Amy D, NP  losartan (COZAAR) 25 MG tablet Take 0.5 tablets (12.5 mg total) by mouth daily. 04/25/18  Yes Clegg, Amy D, NP  oxyCODONE (OXY IR/ROXICODONE) 5 MG immediate release tablet Take 1-2 tablets (5-10 mg total) by mouth every 12 (twelve) hours as needed for severe pain. Patient taking differently: Take 5-10 mg by mouth every 6 (six) hours as needed (pain).  04/25/18  Yes Clegg, Amy D, NP  spironolactone (ALDACTONE) 25 MG tablet Take 0.5 tablets (12.5 mg total) by mouth daily. 03/20/18  Yes Clegg, Amy D, NP    Inpatient Medications: Scheduled  Meds: . aspirin EC  81 mg Oral Daily  . atorvastatin  80 mg Oral q1800  . clopidogrel  75 mg Oral Daily  . digoxin  0.0625 mg Oral Daily  . enoxaparin (LOVENOX) injection  40 mg Subcutaneous Q24H  . furosemide  40 mg Intravenous Q12H  . ipratropium-albuterol  3 mL Nebulization BID  . ivabradine  7.5 mg Oral BID WC  . losartan  12.5 mg Oral Daily  . sodium chloride flush  3 mL Intravenous Q12H  . spironolactone  12.5 mg Oral Daily   Continuous Infusions: . sodium chloride    . ceFEPime (MAXIPIME) IV 1 g (05/05/18 0218)  . vancomycin     PRN Meds: sodium chloride, acetaminophen, albuterol, docusate sodium, ondansetron (ZOFRAN) IV, oxyCODONE, sodium chloride flush  Allergies:    Allergies  Allergen Reactions  . Iodinated Diagnostic Agents Hives and Rash    Needs 13-hour prep before CT scans w/ contrast.   Can do Benadryl 50mg  PO for ESI's, etc  . Latex Hives and Other (See Comments)    Skin irritation  . Pineapple Hives  . Sulfa Antibiotics Hives and Rash  . Codeine Nausea And Vomiting    Social History:   Social History   Socioeconomic History  . Marital status: Married    Spouse name: Not on file  . Number of children: Not on file  . Years of education: Not on file  . Highest education level: Not on file  Occupational History  . Not on file  Social Needs  . Financial resource strain: Not on file  . Food insecurity:    Worry: Not on file    Inability: Not on file  . Transportation needs:    Medical: Not on file    Non-medical: Not on file  Tobacco Use  . Smoking status: Former Smoker    Packs/day: 1.00    Years: 20.00    Pack years: 20.00    Types: Cigarettes    Last attempt to quit: 03/13/2014    Years since quitting: 4.1  . Smokeless tobacco: Never Used  Substance and Sexual Activity  . Alcohol use: Yes    Alcohol/week: 4.8 oz    Types: 8 Glasses of wine per week  . Drug use: No  . Sexual activity: Never  Lifestyle  . Physical activity:    Days per week: Not on file    Minutes per session: Not on file  . Stress: Not on file  Relationships  . Social connections:    Talks on phone: Not on file    Gets together: Not on file    Attends religious service: Not on file    Active member of club or organization: Not on file    Attends meetings of clubs or organizations: Not on file    Relationship status: Not on file  . Intimate partner violence:    Fear of current or ex partner: Not on file    Emotionally abused: Not on file    Physically abused: Not on file    Forced sexual activity: Not on file  Other Topics Concern  . Not on file  Social History Narrative  . Not on file    Family History:    Family History  Problem Relation Age of Onset  . Heart disease Mother   . Hypertension Mother   . Heart attack Mother   . Hypertension Maternal Grandmother   .  Cancer Maternal Grandmother   . Diabetes Paternal Grandmother   .  Stroke Neg Hx      ROS:  Please see the history of present illness.  All other ROS reviewed and negative.     Physical Exam/Data:   Vitals:   05/05/18 0000 05/05/18 0030 05/05/18 0100 05/05/18 0200  BP: 91/61 (!) 93/58 113/67 (!) 84/57  Pulse: 79 78 80 78  Resp: 18 20 17  (!) 23  Temp:      TempSrc:      SpO2: (!) 88% 90% 98% 90%  Weight:      Height:        Intake/Output Summary (Last 24 hours) at 05/05/2018 0335 Last data filed at 05/05/2018 0022 Gross per 24 hour  Intake 284.96 ml  Output -  Net 284.96 ml   Filed Weights   05/04/18 2012  Weight: 45.8 kg (101 lb)   Body mass index is 20.06 kg/m.  General:  Pleasant, thin, chronically ill appearing HEENT: normal Lymph: no adenopathy Neck: no significant JVD Cardiac:  normal S1, S2; RRR; II/VI systolic murmur Lungs:   Mild increased WOB. Coarse breath sounds bilaterally with diminished sounds at R base Abd: soft, nontender Ext: no edema Musculoskeletal:  No deformities, BUE and BLE strength normal and equal Skin: warm and dry  Neuro:  No focal abnormalities noted Psych:  Normal affect   EKG:  The EKG was personally reviewed and demonstrates:  Sinus tachycardia with nonspecific ST changes Telemetry:  Telemetry was personally reviewed and demonstrates:  NSR with HR 80s  Relevant CV Studies:  ECHO 04/20/2018 Left ventricle: Basal function preserved remainder of ventrical serverely hypokinetic The cavity size was severely dilated. Wall thickness was normal. The estimated ejection fraction was 20%. Left ventricular diastolic function parameters were normal. - Aortic valve: There was mild to moderate regurgitation. - Mitral valve: There was mild regurgitation. - Atrial septum: No defect or patent foramen ovale was identified.  LHC 02/03/2018   Prox RCA to Mid RCA lesion is 25% stenosed.  Ost Ramus lesion is 70% stenosed.  Ost Cx to  Prox Cx lesion is 50% stenosed.  Ost LAD to Prox LAD lesion is 100% stenosed.  LV end diastolic pressure is severely elevated.  Hemodynamic findings consistent with severe pulmonary hypertension.  Post intervention, there is a 100% residual stenosis.   Laboratory Data:  Chemistry Recent Labs  Lab 05/02/18 1126 05/04/18 2029  NA 141 138  K 4.3 4.7  CL 108 105  CO2 25  --   GLUCOSE 87 169*  BUN 18 19  CREATININE 0.86 0.90  CALCIUM 9.7  --   GFRNONAA >60  --   GFRAA >60  --   ANIONGAP 8  --     Recent Labs  Lab 05/05/18 0142  PROT 6.7  ALBUMIN 3.4*  AST 35  ALT 45  ALKPHOS 80  BILITOT 0.9   Hematology Recent Labs  Lab 05/04/18 2016 05/04/18 2029 05/05/18 0142  WBC 14.1*  --  14.5*  RBC 4.18  --  3.76*  HGB 11.4* 13.3 10.2*  HCT 38.3 39.0 33.2*  MCV 91.6  --  88.3  MCH 27.3  --  27.1  MCHC 29.8*  --  30.7  RDW 18.4*  --  18.1*  PLT 410*  --  289   Cardiac EnzymesNo results for input(s): TROPONINI in the last 168 hours.  Recent Labs  Lab 05/04/18 2027  TROPIPOC 0.05    BNPNo results for input(s): BNP, PROBNP in the last 168 hours.  DDimer No results for input(s): DDIMER in  the last 168 hours.  Radiology/Studies:  Dg Chest Portable 1 View  Result Date: 05/04/2018 CLINICAL DATA:  Respiratory distress; chest pain EXAM: PORTABLE CHEST 1 VIEW COMPARISON:  May 12 19 FINDINGS: There is a moderate pleural effusion on the right with consolidation in portions of the right middle and lower lobes. There is underlying mild interstitial pulmonary edema. Heart is mildly enlarged with pulmonary vascularity within normal limits. Patient is status post coronary artery bypass grafting. No. No evident lesions. IMPRESSION: Moderate pleural effusion on the right with consolidation in portions of the right middle and lower lobes. Mild interstitial edema. Cardiac prominence. Postoperative change noted. Electronically Signed   By: Lowella Grip III M.D.   On: 05/04/2018  20:34    Assessment and Plan:   Acute on chronic respiratory failure The patient presented to the ED with worsening dyspnea. Workup is notable for significant HTN and mild hypervolemia as well as recurrent pleural effusion and elevated WBC/procalcitonin. Her dyspnea is likely multifactorial from HF, HTN/flash pulmonary edema, effusion, and possible PNA. She has improved with diuresis and BP control in the ED. Antibiotics have also been initiated for suspected PNA. Anticipate further improvement with management of these multiple insults. -Appreciate hospitalist management.  -Continue management of HF and HTN as below -Agree with treatment of PNA. She may benefit from repeat thoracentesis.   Acute on chronic systolic HF H/O cardiogenic shock  As above, the patient's heart failure is likely a contributing factor to her presenting symptoms. She is warm on exam and has normal renal function and lactate, which is reassuring against low output state. Her last TTE 04/2018 showed EF 20%, mild to mod AI, mild MR.  -Continue gentle diuresis with IV furosemide 40 mg BID - No BB with recent low output.  - Continue digoxin 0.0625 mg daily - Continue spiro 12.5 mg daily - Continue losartan 12.5 mg daily - Continue corlanor 7.5 mg BID   CAD c/b NSTEMI/STEMI s/p Emergent CABG 02/03/18: The patient has had some intermittent chest discomfort. ECG and troponin reassuring against AMI as the cause of her presenting symptoms.  - Continue plavix, asa, atorvastatin.    PAF:  CHA2DS2/VASc is at least 4. (CHF, Vasc disease, HTN, Female).  - She has history of Afib RVR in the past in the setting of her Lung CA. Previously on Xarelto but stopped her medications.  - Will continue ASA and Plavix. No anticoagulation with h/o lung cancer.   Aortic insufficiency: Moderate-severe on  Echo 04/20/18.  -Would benefit from ongoing BP control for afterload reduction.   For questions or updates, please contact [place] Please consult [email or web address] for contact info under Cardiology/STEMI.   Signed, Nila Nephew, MD  05/05/2018 3:35 AM

## 2018-05-06 ENCOUNTER — Other Ambulatory Visit: Payer: Self-pay

## 2018-05-06 DIAGNOSIS — J9601 Acute respiratory failure with hypoxia: Secondary | ICD-10-CM

## 2018-05-06 LAB — BASIC METABOLIC PANEL
ANION GAP: 11 (ref 5–15)
BUN: 26 mg/dL — ABNORMAL HIGH (ref 6–20)
CHLORIDE: 101 mmol/L (ref 101–111)
CO2: 26 mmol/L (ref 22–32)
Calcium: 9.2 mg/dL (ref 8.9–10.3)
Creatinine, Ser: 1.06 mg/dL — ABNORMAL HIGH (ref 0.44–1.00)
GFR calc non Af Amer: 57 mL/min — ABNORMAL LOW (ref >60)
Glucose, Bld: 213 mg/dL — ABNORMAL HIGH (ref 65–99)
Potassium: 3.9 mmol/L (ref 3.5–5.1)
SODIUM: 138 mmol/L (ref 135–145)

## 2018-05-06 LAB — TROPONIN I
TROPONIN I: 0.04 ng/mL — AB (ref <0.03)
TROPONIN I: 0.04 ng/mL — AB (ref <0.03)
Troponin I: 0.03 ng/mL (ref <0.03)

## 2018-05-06 LAB — PROCALCITONIN: Procalcitonin: 2.07 ng/mL

## 2018-05-06 MED ORDER — NITROGLYCERIN 0.4 MG SL SUBL
0.4000 mg | SUBLINGUAL_TABLET | SUBLINGUAL | Status: DC | PRN
  Filled 2018-05-06: qty 1

## 2018-05-06 MED ORDER — SODIUM CHLORIDE 0.9 % IV SOLN
1.0000 g | INTRAVENOUS | Status: DC
  Administered 2018-05-06 – 2018-05-08 (×3): 1 g via INTRAVENOUS
  Filled 2018-05-06 (×3): qty 1

## 2018-05-06 NOTE — Progress Notes (Signed)
Called for chest discomfort. EKG no significant ischemia. Trop flat. She is still nausea, but vomiting stopped. No longer having chest pain. Continue observation.  Hilbert Corrigan PA Pager: [phone]

## 2018-05-06 NOTE — Progress Notes (Signed)
Advanced Heart Failure Rounding Note  PCP-Cardiologist: No primary care provider on file.   Subjective:    Readmitted with recurrent respiratory failure. Found to have recurrent R pleural effusion. Probable PNA and HF.  PCT 2.07. BNP 2,600 (up from 1,600)  Received IV lasix. Now on abx. S/p right thoracentesis with 900cc out.   Breathing better but still very rhonchorous. No fevers or chills.    Objective:   Weight Range: 43.9 kg (96 lb 12.8 oz) Body mass index is 19.22 kg/m.   Vital Signs:   Temp:  [98.7 F (37.1 C)-99.1 F (37.3 C)] 98.8 F (37.1 C) (05/27 0600) Pulse Rate:  [85-107] 102 (05/27 0942) Resp:  [17-28] 18 (05/27 0920) BP: (85-114)/(42-77) 90/63 (05/27 0947) SpO2:  [94 %-100 %] 97 % (05/27 0947) FiO2 (%):  [28 %] 28 % (05/27 0920) Weight:  [43.9 kg (96 lb 12.8 oz)] 43.9 kg (96 lb 12.8 oz) (05/27 0500) Last BM Date: (PTA)  Weight change: Filed Weights   05/04/18 2012 05/06/18 0500  Weight: 45.8 kg (101 lb) 43.9 kg (96 lb 12.8 oz)    Intake/Output:   Intake/Output Summary (Last 24 hours) at 05/06/2018 1000 Last data filed at 05/06/2018 0600 Gross per 24 hour  Intake 800 ml  Output 550 ml  Net 250 ml      Physical Exam    General:  Thin. Weak appearing Sitting up in bed  No resp difficulty HEENT: Normal Neck: Supple. JVP 7-8 . Carotids 2+ bilat; no bruits. No lymphadenopathy or thyromegaly appreciated. Cor: PMI nondisplaced. Regular rate & rhythm. Soft AI Lungs: Diffuse rhonchi. Dull at R base  Abdomen: Soft, nontender, nondistended. No hepatosplenomegaly. No bruits or masses. Good bowel sounds. Extremities: No cyanosis, clubbing, rash, edema Neuro: Alert & orientedx3, cranial nerves grossly intact. moves all 4 extremities w/o difficulty. Affect pleasant   Telemetry   NSR 90-100s Personally reviewed    Labs    CBC Recent Labs    05/04/18 2016 05/04/18 2029 05/05/18 0142  WBC 14.1*  --  14.5*  NEUTROABS 11.1*  --  13.1*  HGB  11.4* 13.3 10.2*  HCT 38.3 39.0 33.2*  MCV 91.6  --  88.3  PLT 410*  --  409   Basic Metabolic Panel Recent Labs    05/04/18 2029 05/06/18 0325  NA 138 138  K 4.7 3.9  CL 105 101  CO2  --  26  GLUCOSE 169* 213*  BUN 19 26*  CREATININE 0.90 1.06*  CALCIUM  --  9.2   Liver Function Tests Recent Labs    05/05/18 0142  AST 35  ALT 45  ALKPHOS 80  BILITOT 0.9  PROT 6.7  ALBUMIN 3.4*   No results for input(s): LIPASE, AMYLASE in the last 72 hours. Cardiac Enzymes No results for input(s): CKTOTAL, CKMB, CKMBINDEX, TROPONINI in the last 72 hours.  BNP: BNP (last 3 results) Recent Labs    02/15/18 0449 04/19/18 1218 05/05/18 0142  BNP 2,526.7* 1,663.5* 2,601.1*    ProBNP (last 3 results) No results for input(s): PROBNP in the last 8760 hours.   D-Dimer No results for input(s): DDIMER in the last 72 hours. Hemoglobin A1C No results for input(s): HGBA1C in the last 72 hours. Fasting Lipid Panel No results for input(s): CHOL, HDL, LDLCALC, TRIG, CHOLHDL, LDLDIRECT in the last 72 hours. Thyroid Function Tests No results for input(s): TSH, T4TOTAL, T3FREE, THYROIDAB in the last 72 hours.  Invalid input(s): FREET3  Other results:  Imaging    Dg Chest 1 View  Result Date: 05/05/2018 CLINICAL DATA:  Status post thoracentesis. EXAM: CHEST  1 VIEW COMPARISON:  05/04/2018. FINDINGS: Cardiac enlargement. Status post CABG procedure. Significant decrease in volume of right pleural effusion. No pneumothorax status post thoracentesis. IMPRESSION: 1. No complications identified status post right thoracentesis. Electronically Signed   By: Kerby Moors M.D.   On: 05/05/2018 11:36   US Thoracentesis Asp Pleural Space W/img Guide  Result Date: 05/05/2018 INDICATION: Patient with right pleural effusion. Request is made for diagnostic and therapeutic thoracentesis. EXAM: ULTRASOUND GUIDED DIAGNOSTIC AND THERAPEUTIC THORACENTESIS MEDICATIONS: 10 mL 1% lidocaine COMPLICATIONS:  None immediate. PROCEDURE: An ultrasound guided thoracentesis was thoroughly discussed with the patient and questions answered. The benefits, risks, alternatives and complications were also discussed. The patient understands and wishes to proceed with the procedure. Written consent was obtained. Ultrasound was performed to localize and mark an adequate pocket of fluid in the right chest. The area was then prepped and draped in the normal sterile fashion. 1% Lidocaine was used for local anesthesia. Under ultrasound guidance a 6 Fr Safe-T-Centesis catheter was introduced. Thoracentesis was performed. The catheter was removed and a dressing applied. FINDINGS: A total of approximately 900 mL of amber fluid was removed. Samples were sent to the laboratory as requested by the clinical team. IMPRESSION: Successful ultrasound guided diagnostic and therapeutic right thoracentesis yielding 900 mL of pleural fluid. Read by: Brynda Greathouse PA-C No pneumothorax on follow-up chest radiograph. Electronically Signed   By: Lucrezia Europe M.D.   On: 05/05/2018 11:53      Medications:     Scheduled Medications: . aspirin EC  81 mg Oral Daily  . atorvastatin  80 mg Oral q1800  . clopidogrel  75 mg Oral Daily  . digoxin  0.0625 mg Oral Daily  . enoxaparin (LOVENOX) injection  40 mg Subcutaneous Q24H  . feeding supplement (ENSURE ENLIVE)  237 mL Oral TID WC  . furosemide  40 mg Intravenous Q12H  . ipratropium-albuterol  3 mL Nebulization BID  . ivabradine  7.5 mg Oral BID WC  . losartan  12.5 mg Oral Daily  . sodium chloride flush  3 mL Intravenous Q12H  . spironolactone  12.5 mg Oral Daily     Infusions: . sodium chloride    . ceFEPime (MAXIPIME) IV Stopped (05/05/18 2222)  . 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   Recent admit with NSTEMI and shock. Underwent emergent cath 02/03/18 showed LAD 100% stenosed, LCx 95% stenosed. Taken for emergent CABG 02/03/18. Required impella post op. Hospital course complicated by cardiogenic shock, HCAP, A fib, respiratory failure, and swallowing issues.  Admit from HF clinic with acute dyspnea.     Assessment/Plan   1. Acute on chronic hypoxic respiratory failure - likely multifactorial due to HF, PNA and recurrent R pleural effusion - PCT 2.07 BNP 2,600 (up from 1,600) - now improved with diuresis, abx and repeat right thoracentesis - will continue IV lasix one more day.  - continue abx - watch for recurrent pleural effusion. Suspect she may need Pleurex - co-ox on last admit was 65% arguing  against shock. Will need RHC prior to d/c - d/w Dr. Tana Coast  2. Acute on chronic Systolic HF  - has iCM related to large NSTEMI with LAD occlusion requiring urgent CABG 2/19 and Impella supprot -TEE 02/03/18 LVEF 20%, Mild to Mod AR, Mild/Mod MR, RV normal.   - Echo 4/19 EF 25 with moderate aortic insufficiency  - BNP up from baseline - continue IV diuresis one more day  - continue digoxin, ivabradine, spiro - BP had been too low for ACE/ARB/ARNI or b-blocker - plan RHC prior to d/c   3. CAD - s/p NSTEMI/STEMIin 2/19 Cath with severe 2v CAD and severe LV dysfunction/ICM. s/p Emergent CABG 02/03/18 requiring Impella support -No s/s ischemia -Continue plavix, asa, atorvastatin.  4.PAF:  CHA2DS2/VASc is at least 4. (CHF, Vasc disease, HTN, Female).  - She has history of Afib RVR in the past in the setting of her Lung CA. Previously on Xarelto but stopped her medications.  -Today she is in sinus tach. - Will continue ASA and Plavix. No  anticoagulation with h/o lung caner.  5. 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  - Chest CT 02/19/18 with mass-like consolidation in R hilum concerning for recurrent tumor. -Repeat CT 03/27/2018 -No definite findings of locally recurrent tumor in the right lung. 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 Continued chest CT surveillance is advised in 3-6 months. - cytology from pleural effusion in 4/19 was negative  - Needs follow up with Dr Julien Nordmann.   6. Aortic insufficiency: Moderate-severe on postop echo. Likely potentiating CHF. -Repeat ECHO.  7. Severe protein-calorie malnutrition  - nutrition consult  Length of Stay: 1  Glori Bickers, MD  05/06/2018, 10:00 AM  Advanced Heart Failure Team Pager [phone] (M-F; 7a - 4p)  Please contact [facility] for night-coverage after hours (4p -7a ) and weekends on [email or web address]

## 2018-05-06 NOTE — Progress Notes (Signed)
SLP Cancellation Note  Patient Details Name: Brittany [name] MRN: [id number] DOB: 01/08/1961   Cancelled treatment:        Pt leaning over in bed with emesis bag. Politely declined session due to nausea. Will continue efforts tomorrow as schedule allows    [name] 05/06/2018, 2:12 PM [name].[employer] [phone]

## 2018-05-06 NOTE — Progress Notes (Signed)
CCMD alerted that pt had a rhythm change from NSR to accelerated junctional. Pt denies any pain and leads verified for correct placement. EKG obtained, shows NSR. Will continue to monitor closely.  Brittany [name]

## 2018-05-06 NOTE — Progress Notes (Signed)
[facility]                                                                              Patient Demographics  Brittany [name], is a 57 y.o. female, DOB - Jan 28, 1961, JJH:[id number]  Admit date - 05/04/2018   Admitting Physician Vianne Bulls, MD  Outpatient Primary MD for the patient is System, Pcp Not In  Outpatient specialists:   LOS - 1  days   Medical records reviewed and are as summarized below:    Chief Complaint  Patient presents with  . Respiratory Distress       Brief summary   Patient is a 57 year old female with chronic systolic CHF with EF 81%, CAD status post CABG paroxysmal A. fib not on anticoagulation, chronic pain presented with progressive shortness of breath.  Patient was recently discharged on 5/16 after management of acute on chronic CHF.  Patient reported insidious 12 mental worsening dyspnea in the last 2 days, symptomatic at rest on the day of admission.  In ED tachycardiac up to 130 and BP 88/60.  Patient was placed on BiPAP in ED.  Chest x-ray showed moderate right pleural effusion, consolidation in right middle and lower lobes, mild interstitial edema.  Patient was placed on vancomycin and Zosyn, cardiology was consulted, admitted for further work-up.  Assessment & Plan    Principal Problem: Acute on chronic hypoxic respiratory failure secondary to acute on chronic systolic CHF (congestive heart failure) (HCC) and H CAP, right-sided pleural effusion - 2D echo recently showed EF of 20% -Cardiology consulted, continue IV Lasix for diuresis, digoxin, ivabradine.  Place ARB on hold due to soft BP -Underwent thoracentesis on 5/26, 900 cc removed -Positive balance 635 cc weight down from 101lbs to 96lbs -Discussed with cardiology, Dr. [name], plan for right heart cath in a.m.  Active Problems: HCAP (healthcare associated pneumonia) -Chest x-ray showed right pleural effusion with associated consolidation in right middle lobe,  right lower lung -Procalcitonin 2.0, leukocytosis, continue IV vancomycin and cefepime     Recurrent pleural effusion on right -Status post thoracentesis 5/26, 900 cc removed, if continues to have recurrent pleural effusion, may benefit from Pleurx     HTN (hypertension) -BP currently soft, Cozaar on hold, continue Lasix  History of lung CA -Small cell, status post radiation and chemo in 2015 -Recent thoracentesis with negative cytology, outpatient surveillance CT in 6 months  Asthma No wheezing, sick  Paroxysmal atrial fibrillation -History of prior A. fib RVR in the past in the setting of lung CA, was on Xarelto in the past but stopped her medications -Continue aspirin and Plavix, per cardiology no anticoagulation with history of lung CA.  Code Status: Full code DVT Prophylaxis:  Lovenox Family Communication: Discussed in detail with the patient, all imaging results, lab results explained to the patient   Disposition Plan: When medically ready  Time Spent in minutes 25 minutes  Procedures:  None  Consultants:   Cardiology   Antimicrobials:   IV vancomycin 5/25 IV cefepime 5/25  Medications  Scheduled Meds: . aspirin EC  81 mg Oral Daily  . atorvastatin  80 mg Oral q1800  .  clopidogrel  75 mg Oral Daily  . digoxin  0.0625 mg Oral Daily  . enoxaparin (LOVENOX) injection  40 mg Subcutaneous Q24H  . feeding supplement (ENSURE ENLIVE)  237 mL Oral TID WC  . furosemide  40 mg Intravenous Q12H  . ipratropium-albuterol  3 mL Nebulization BID  . ivabradine  7.5 mg Oral BID WC  . losartan  12.5 mg Oral Daily  . sodium chloride flush  3 mL Intravenous Q12H  . spironolactone  12.5 mg Oral Daily   Continuous Infusions: . sodium chloride    . ceFEPime (MAXIPIME) IV Stopped (05/05/18 2222)  . vancomycin Stopped (05/06/18 0053)   PRN Meds:.sodium chloride, acetaminophen, albuterol, docusate sodium, ondansetron (ZOFRAN) IV, oxyCODONE, sodium chloride  flush   Antibiotics   Anti-infectives (From admission, onward)   Start     Dose/Rate Route Frequency Ordered Stop   05/05/18 2300  vancomycin (VANCOCIN) IVPB 1000 mg/200 mL premix     1,000 mg 200 mL/hr over 60 Minutes Intravenous Every 24 hours 05/05/18 0124 05/12/18 2259   05/05/18 0230  ceFEPIme (MAXIPIME) 1 g in sodium chloride 0.9 % 100 mL IVPB     1 g 200 mL/hr over 30 Minutes Intravenous Every 12 hours 05/05/18 0101 05/12/18 2159   05/04/18 2245  vancomycin (VANCOCIN) IVPB 1000 mg/200 mL premix     1,000 mg 200 mL/hr over 60 Minutes Intravenous  Once 05/04/18 2244 05/05/18 0022   05/04/18 2115  piperacillin-tazobactam (ZOSYN) IVPB 3.375 g     3.375 g 100 mL/hr over 30 Minutes Intravenous  Once 05/04/18 2115 05/04/18 2229        Subjective:   Landrie Beale was seen and examined today.  No chest pain, denies any specific complaints.  Does not like the food.  No BiPAP needed overnight.  Patient denies dizziness, chest pain, abdominal pain, N/V/D/C, new weakness, numbess, tingling.    Objective:   Vitals:   05/06/18 0600 05/06/18 0920 05/06/18 0942 05/06/18 0947  BP: 94/63   90/63  Pulse:  97 (!) 102   Resp:  18    Temp: 98.8 F (37.1 C)     TempSrc: Oral     SpO2: 98% 98%  97%  Weight:      Height:        Intake/Output Summary (Last 24 hours) at 05/06/2018 1356 Last data filed at 05/06/2018 0600 Gross per 24 hour  Intake 680 ml  Output 550 ml  Net 130 ml     Wt Readings from Last 3 Encounters:  05/06/18 43.9 kg (96 lb 12.8 oz)  05/02/18 46.2 kg (101 lb 12.8 oz)  04/25/18 46.1 kg (101 lb 10.1 oz)     Exam   General: Alert and oriented x 3, NAD  Eyes:  HEENT:  Atraumatic, normocephalic  Cardiovascular: S1 S2 auscultated, 2/6 SEM, RRR  No pedal edema b/l  Respiratory: dec bs at bases,   Gastrointestinal: Soft, nontender, nondistended, + bowel sounds  Ext: no pedal edema bilaterally  Neuro: no deficits  Musculoskeletal: No digital  cyanosis, clubbing  Skin: No rashes  Psych: Normal affect and demeanor, alert and oriented x3     Data Reviewed:  I have personally reviewed following labs and imaging studies  Micro Results Recent Results (from the past 240 hour(s))  Blood culture (routine x 2)     Status: None (Preliminary result)   Collection Time: 05/04/18  9:48 PM  Result Value Ref Range Status   Specimen Description BLOOD RIGHT FOREARM  Final   Special Requests   Final    BOTTLES DRAWN AEROBIC ONLY Blood Culture adequate volume   Culture   Final    NO GROWTH < 12 HOURS Performed at [facility], [address], [place], [place] [address]    Report Status PENDING  Incomplete  Blood culture (routine x 2)     Status: None (Preliminary result)   Collection Time: 05/04/18  9:50 PM  Result Value Ref Range Status   Specimen Description BLOOD LEFT ARM  Final   Special Requests   Final    BOTTLES DRAWN AEROBIC AND ANAEROBIC Blood Culture adequate volume   Culture   Final    NO GROWTH < 12 HOURS Performed at [facility], [place] [address] [place], [place] [address]    Report Status PENDING  Incomplete  Body fluid culture     Status: None (Preliminary result)   Collection Time: 05/05/18 11:06 AM  Result Value Ref Range Status   Specimen Description PLEURAL  Final   Special Requests NONE  Final   Gram Stain   Final    RARE WBC PRESENT, PREDOMINANTLY PMN NO ORGANISMS SEEN    Culture   Final    NO GROWTH 1 DAY Performed at [facility], [place] [address], [place], [place] [address]    Report Status PENDING  Incomplete    Radiology Reports Dg Chest 1 View  Result Date: 05/05/2018 CLINICAL DATA:  Status post thoracentesis. EXAM: CHEST  1 VIEW COMPARISON:  05/04/2018. FINDINGS: Cardiac enlargement. Status post CABG procedure. Significant decrease in volume of right pleural effusion. No pneumothorax status post thoracentesis. IMPRESSION: 1. No complications identified status post right  thoracentesis. Electronically Signed   By: Kerby Moors M.D.   On: 05/05/2018 11:36   Dg Chest 1 View  Result Date: 04/21/2018 CLINICAL DATA:  Status post right thoracentesis and biopsy today. EXAM: CHEST  1 VIEW COMPARISON:  CT chest 04/20/2017. Single-view of the chest 04/19/2017. FINDINGS: Right pleural effusion is markedly decreased after thoracentesis. No pneumothorax. No left effusion. There is cardiomegaly. Right PICC is now in place with the tip projecting in the mid to lower superior vena cava. Collapse of the right middle lobe is unchanged. Lungs are emphysematous. IMPRESSION: Marked decrease in right pleural effusion after thoracentesis. Negative for pneumothorax or other new abnormality. No change in right middle lobe atelectasis. Emphysema. Cardiomegaly. Electronically Signed   By: Inge Rise M.D.   On: 04/21/2018 15:52   Dg Chest 2 View  Result Date: 04/08/2018 CLINICAL DATA:  Status post coronary artery bypass graft. EXAM: CHEST - 2 VIEW COMPARISON:  Radiographs of February 20, 2018. FINDINGS: Stable cardiomegaly. Status post coronary bypass graft. No pneumothorax is noted. Left lung is clear. Minimal right basilar subsegmental atelectasis or scarring is noted with minimal pleural effusion. Bony thorax is unremarkable. IMPRESSION: Minimal right basilar subsegmental atelectasis or scarring with minimal right pleural effusion. Electronically Signed   By: Marijo Conception, M.D.   On: 04/08/2018 14:47   Ct Chest W Contrast  Result Date: 04/21/2018 CLINICAL DATA:  Shortness of breath. History of small cell lung cancer, myocardial infarction, hypertension. EXAM: CT CHEST WITH CONTRAST TECHNIQUE: Multidetector CT imaging of the chest was performed during intravenous contrast administration. CONTRAST:  41mL OMNIPAQUE IOHEXOL 300 MG/ML  SOLN COMPARISON:  Chest radiograph Apr 19, 2018 and CT chest March 27, 2018 FINDINGS: CARDIOVASCULAR: The heart is mildly enlarged and unchanged. No pericardial  effusion. Status post  median sternotomy for CABG. Stable anterior superiorly directed 17 mm contrast filled out patching from anterior ascending aorta terminating at surgical clips with possible nonenhancing component superiorly (sagittal image 51). Mild calcific atherosclerosis aortic arch, patent arch vessel origins. No central pulmonary embolism though not tailored for evaluation. MEDIASTINUM/NODES: RIGHT hilar and subcarinal lymphadenopathy contiguous RIGHT hilar consolidation. RIGHT PICC. LUNGS/PLEURA: Tracheobronchial tree is patent, no pneumothorax. Hypoenhancing wedge-like consolidation RIGHT middle lobe with enhancing RIGHT lower lobe atelectasis. Enlarging RIGHT perihilar amorphous consolidation. Increasing moderate RIGHT pleural effusion without abnormal pleural enhancement. Stable moderate centrilobular emphysema. UPPER ABDOMEN: Nonacute. MUSCULOSKELETAL: Nonacute. Subcentimeter RIGHT thyroid nodule below size followup recommendations. IMPRESSION: 1. Increasing moderate RIGHT pleural effusion, malignant etiology possible. 2. Increasing RIGHT hilar consolidation (differential diagnosis includes pneumonia, post treatment related changes or recurrent tumor). RIGHT middle lobe atelectasis versus pneumonia. 3. 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 Recommend cardiothoracic surgery consultation. Aortic Atherosclerosis (ICD10-I70.0) and Emphysema (ICD10-J43.9). Electronically Signed   By: Elon Alas M.D.   On: 04/21/2018 00:23   Dg Chest Portable 1 View  Result Date: 05/04/2018 CLINICAL DATA:  Respiratory distress; chest pain EXAM: PORTABLE CHEST 1 VIEW COMPARISON:  May 12 19 FINDINGS: There is a moderate pleural effusion on the right with consolidation in portions of the right middle and lower lobes. There is underlying mild interstitial pulmonary edema. Heart is mildly enlarged with pulmonary vascularity within normal limits. Patient is  status post coronary artery bypass grafting. No. No evident lesions. IMPRESSION: Moderate pleural effusion on the right with consolidation in portions of the right middle and lower lobes. Mild interstitial edema. Cardiac prominence. Postoperative change noted. Electronically Signed   By: Lowella Grip III M.D.   On: 05/04/2018 20:34   Dg Chest Port 1 View  Result Date: 04/19/2018 CLINICAL DATA:  Recent worsening of dyspnea. EXAM: PORTABLE CHEST 1 VIEW COMPARISON:  04/08/2018. FINDINGS: The cardiac silhouette is markedly increased, possible pericardial effusion. Increasing RIGHT pleural effusion as well. Consolidation or atelectasis at the RIGHT base. Median sternotomy for CABG. IMPRESSION: Worsening aeration and increasing size of the cardiac silhouette. See discussion above. Electronically Signed   By: Staci Righter M.D.   On: 04/19/2018 16:35   [place] Ekg Site Rite  Result Date: 04/19/2018 If Site Rite image not attached, placement could not be confirmed due to current cardiac rhythm.  US Thoracentesis Asp Pleural Space W/img Guide  Result Date: 05/05/2018 INDICATION: Patient with right pleural effusion. Request is made for diagnostic and therapeutic thoracentesis. EXAM: ULTRASOUND GUIDED DIAGNOSTIC AND THERAPEUTIC THORACENTESIS MEDICATIONS: 10 mL 1% lidocaine COMPLICATIONS: None immediate. PROCEDURE: An ultrasound guided thoracentesis was thoroughly discussed with the patient and questions answered. The benefits, risks, alternatives and complications were also discussed. The patient understands and wishes to proceed with the procedure. Written consent was obtained. Ultrasound was performed to localize and mark an adequate pocket of fluid in the right chest. The area was then prepped and draped in the normal sterile fashion. 1% Lidocaine was used for local anesthesia. Under ultrasound guidance a 6 Fr Safe-T-Centesis catheter was introduced. Thoracentesis was performed. The catheter was removed and a  dressing applied. FINDINGS: A total of approximately 900 mL of amber fluid was removed. Samples were sent to the laboratory as requested by the clinical team. IMPRESSION: Successful ultrasound guided diagnostic and therapeutic right thoracentesis yielding 900 mL of pleural fluid. Read by: Brynda Greathouse PA-C No pneumothorax on follow-up chest radiograph. Electronically Signed   By: Eden Emms.D.  On: 05/05/2018 11:53   US Thoracentesis Asp Pleural Space W/img Guide  Result Date: 04/23/2018 CLINICAL DATA:  57 year old female with pleural effusion EXAM: ULTRASOUND GUIDED THORACENTESIS COMPARISON:  None. PROCEDURE: An ultrasound guided thoracentesis was thoroughly discussed with the patient and questions answered. The benefits, risks, alternatives and complications were also discussed. The patient understands and wishes to proceed with the procedure. Written consent was obtained. Ultrasound was performed to localize and mark an adequate pocket of fluid in the right chest. The area was then prepped and draped in the normal sterile fashion. 1% Lidocaine was used for local anesthesia. Under ultrasound guidance a 19 gauge Yueh catheter was introduced. Thoracentesis was performed. The catheter was removed and a dressing applied. Complications:  None FINDINGS: A total of approximately 480cc of thin yellow fluid was removed. IMPRESSION: Status post US guided right thoracentesis. Signed, Dulcy Fanny. Earleen Newport, DO [facility] Electronically Signed   By: Corrie Mckusick D.O.   On: 04/23/2018 07:35    Lab Data:  CBC: Recent Labs  Lab 05/04/18 2016 05/04/18 2029 05/05/18 0142  WBC 14.1*  --  14.5*  NEUTROABS 11.1*  --  13.1*  HGB 11.4* 13.3 10.2*  HCT 38.3 39.0 33.2*  MCV 91.6  --  88.3  PLT 410*  --  283   Basic Metabolic Panel: Recent Labs  Lab 05/02/18 1126 05/04/18 2029 05/06/18 0325  NA 141 138 138  K 4.3 4.7 3.9  CL 108 105 101  CO2 25  --   26  GLUCOSE 87 169* 213*  BUN 18 19 26*  CREATININE 0.86 0.90 1.06*  CALCIUM 9.7  --  9.2   GFR: Estimated Creatinine Clearance: 40.6 mL/min (A) (by C-G formula based on SCr of 1.06 mg/dL (H)). Liver Function Tests: Recent Labs  Lab 05/05/18 0142  AST 35  ALT 45  ALKPHOS 80  BILITOT 0.9  PROT 6.7  ALBUMIN 3.4*   No results for input(s): LIPASE, AMYLASE in the last 168 hours. No results for input(s): AMMONIA in the last 168 hours. Coagulation Profile: No results for input(s): INR, PROTIME in the last 168 hours. Cardiac Enzymes: Recent Labs  Lab 05/06/18 1054  TROPONINI 0.04*   BNP (last 3 results) No results for input(s): PROBNP in the last 8760 hours. HbA1C: No results for input(s): HGBA1C in the last 72 hours. CBG: No results for input(s): GLUCAP in the last 168 hours. Lipid Profile: No results for input(s): CHOL, HDL, LDLCALC, TRIG, CHOLHDL, LDLDIRECT in the last 72 hours. Thyroid Function Tests: No results for input(s): TSH, T4TOTAL, FREET4, T3FREE, THYROIDAB in the last 72 hours. Anemia Panel: No results for input(s): VITAMINB12, FOLATE, FERRITIN, TIBC, IRON, RETICCTPCT in the last 72 hours. Urine analysis:    Component Value Date/Time   COLORURINE YELLOW 02/27/2018 1255   APPEARANCEUR HAZY (A) 02/27/2018 1255   LABSPEC 1.014 02/27/2018 1255   PHURINE 8.0 02/27/2018 1255   GLUCOSEU NEGATIVE 02/27/2018 1255   HGBUR NEGATIVE 02/27/2018 1255   BILIRUBINUR NEGATIVE 02/27/2018 1255   KETONESUR NEGATIVE 02/27/2018 1255   PROTEINUR NEGATIVE 02/27/2018 1255   UROBILINOGEN 1.0 03/15/2015 1117   NITRITE NEGATIVE 02/27/2018 1255   LEUKOCYTESUR SMALL (A) 02/27/2018 1255     Ripudeep Rai M.D. [facility] 05/06/2018, 1:56 PM  Pager: [phone] Between 7am to 7pm - call Pager - [phone]  After 7pm go to [email or web address] - password [email or web address]  Call night coverage person covering after 7pm

## 2018-05-06 NOTE — Progress Notes (Addendum)
cardilogist notified of pt c/o of 5 out of 10 bilateral upper chest pain & nausea  Around 1324. Also pt's trop was 0.04. Pt given 5 mg of oxy IR. A repeat ekg was done as well. Pt refused to take prn nausea medicine. Will continue to monitor the pt. Hoover Brunette, RN

## 2018-05-06 NOTE — Progress Notes (Signed)
Pt c/o of 5/10 sharp lt sided cp w/o radiation or sob. MD ( hospitalist)  notified. VS stable & charted.  EKG done & in chart. New orders for troponin to be drawn. Will continue to monitor the pt. MD Bensimohn at bedside currently. Hoover Brunette, RN

## 2018-05-07 LAB — TROPONIN I: Troponin I: 0.04 ng/mL (ref <0.03)

## 2018-05-07 LAB — BASIC METABOLIC PANEL
Anion gap: 11 (ref 5–15)
BUN: 33 mg/dL — ABNORMAL HIGH (ref 6–20)
CALCIUM: 9.3 mg/dL (ref 8.9–10.3)
CO2: 27 mmol/L (ref 22–32)
CREATININE: 1.29 mg/dL — AB (ref 0.44–1.00)
Chloride: 99 mmol/L — ABNORMAL LOW (ref 101–111)
GFR calc Af Amer: 52 mL/min — ABNORMAL LOW (ref >60)
GFR calc non Af Amer: 45 mL/min — ABNORMAL LOW (ref >60)
GLUCOSE: 125 mg/dL — AB (ref 65–99)
Potassium: 4.3 mmol/L (ref 3.5–5.1)
Sodium: 137 mmol/L (ref 135–145)

## 2018-05-07 LAB — VANCOMYCIN, TROUGH: VANCOMYCIN TR: 11 ug/mL — AB (ref 15–20)

## 2018-05-07 MED ORDER — FUROSEMIDE 10 MG/ML IJ SOLN
20.0000 mg | Freq: Once | INTRAMUSCULAR | Status: AC
  Administered 2018-05-07: 20 mg via INTRAVENOUS

## 2018-05-07 MED ORDER — VANCOMYCIN HCL 10 G IV SOLR
1250.0000 mg | INTRAVENOUS | Status: DC
  Administered 2018-05-07: 1250 mg via INTRAVENOUS
  Filled 2018-05-07: qty 1250

## 2018-05-07 MED ORDER — SPIRONOLACTONE 12.5 MG HALF TABLET
12.5000 mg | ORAL_TABLET | Freq: Every day | ORAL | Status: DC
  Administered 2018-05-08 – 2018-05-09 (×2): 12.5 mg via ORAL
  Filled 2018-05-07 (×2): qty 1

## 2018-05-07 NOTE — Progress Notes (Signed)
On arrival patient was already on CPAP tolerating it well. No distress or complications noted.

## 2018-05-07 NOTE — Progress Notes (Addendum)
Advanced Heart Failure Rounding Note  PCP-Cardiologist: No primary care provider on file.   Subjective:    Readmitted with recurrent respiratory failure. Found to have recurrent R pleural effusion (s/p thoracentesis)  Probable PNA and HF.  PCT 2.07. BNP 2,600 (up from 1,600)  Received IV lasix. Now on abx.   Feeling somewhat better but still weak. Had some SOB overnight but overall improved.Cough improving. No fevers or chills.   Mild lightheadedness with rapid standing.   Cr 1.29. K 4.3. Weight shows up 3 lbs.   Objective:   Weight Range: 99 lb 3.2 oz (45 kg) Body mass index is 19.7 kg/m.   Vital Signs:   Temp:  [98.3 F (36.8 C)-98.5 F (36.9 C)] 98.3 F (36.8 C) (05/28 0615) Pulse Rate:  [70-102] 80 (05/28 0615) Resp:  [18] 18 (05/27 0920) BP: (90-93)/(55-68) 93/59 (05/28 0615) SpO2:  [95 %-100 %] 100 % (05/28 0615) FiO2 (%):  [28 %] 28 % (05/27 0920) Weight:  [99 lb 3.2 oz (45 kg)] 99 lb 3.2 oz (45 kg) (05/28 0615) Last BM Date: (PTA)  Weight change: Filed Weights   05/04/18 2012 05/06/18 0500 05/07/18 0615  Weight: 101 lb (45.8 kg) 96 lb 12.8 oz (43.9 kg) 99 lb 3.2 oz (45 kg)   Intake/Output:   Intake/Output Summary (Last 24 hours) at 05/07/2018 0826 Last data filed at 05/07/2018 0554 Gross per 24 hour  Intake 780 ml  Output 750 ml  Net 30 ml    Physical Exam    General: Thin, weak appearing.  HEENT: Normal anicteric  Neck: Supple. JVP 5-6. Carotids 2+ bilat; no bruits. No thyromegaly or nodule noted. Cor: PMI nondisplaced. RRR, No M/G/R noted Lungs: Diminished. +Rhonchi that clear with cough. No wheezeAbdomen: Soft, non-tender, non-distended, no HSM. No bruits or masses. +BS  Extremities: no cyanosis, clubbing, rash, edema Neuro: alert & oriented x 3, cranial nerves grossly intact. moves all 4 extremities w/o difficulty. Affect pleasant   Telemetry   NSR 80-90s, personally reviewed.   Labs    CBC Recent Labs    05/04/18 2016  05/04/18 2029 05/05/18 0142  WBC 14.1*  --  14.5*  NEUTROABS 11.1*  --  13.1*  HGB 11.4* 13.3 10.2*  HCT 38.3 39.0 33.2*  MCV 91.6  --  88.3  PLT 410*  --  818   Basic Metabolic Panel Recent Labs    05/06/18 0325 05/07/18 0242  NA 138 137  K 3.9 4.3  CL 101 99*  CO2 26 27  GLUCOSE 213* 125*  BUN 26* 33*  CREATININE 1.06* 1.29*  CALCIUM 9.2 9.3   Liver Function Tests Recent Labs    05/05/18 0142  AST 35  ALT 45  ALKPHOS 80  BILITOT 0.9  PROT 6.7  ALBUMIN 3.4*   No results for input(s): LIPASE, AMYLASE in the last 72 hours. Cardiac Enzymes Recent Labs    05/06/18 1434 05/06/18 2037 05/07/18 0242  TROPONINI 0.03* 0.04* 0.04*    BNP: BNP (last 3 results) Recent Labs    02/15/18 0449 04/19/18 1218 05/05/18 0142  BNP 2,526.7* 1,663.5* 2,601.1*    ProBNP (last 3 results) No results for input(s): PROBNP in the last 8760 hours.   D-Dimer No results for input(s): DDIMER in the last 72 hours. Hemoglobin A1C No results for input(s): HGBA1C in the last 72 hours. Fasting Lipid Panel No results for input(s): CHOL, HDL, LDLCALC, TRIG, CHOLHDL, LDLDIRECT in the last 72 hours. Thyroid Function Tests No results for  input(s): TSH, T4TOTAL, T3FREE, THYROIDAB in the last 72 hours.  Invalid input(s): FREET3  Other results:   Imaging    No results found.   Medications:     Scheduled Medications: . aspirin EC  81 mg Oral Daily  . atorvastatin  80 mg Oral q1800  . clopidogrel  75 mg Oral Daily  . digoxin  0.0625 mg Oral Daily  . enoxaparin (LOVENOX) injection  40 mg Subcutaneous Q24H  . feeding supplement (ENSURE ENLIVE)  237 mL Oral TID WC  . furosemide  40 mg Intravenous Q12H  . ipratropium-albuterol  3 mL Nebulization BID  . ivabradine  7.5 mg Oral BID WC  . sodium chloride flush  3 mL Intravenous Q12H  . spironolactone  12.5 mg Oral Daily    Infusions: . sodium chloride    . ceFEPime (MAXIPIME) IV Stopped (05/06/18 2230)  . 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   Recent admit with NSTEMI and shock. Underwent emergent cath 02/03/18 showed LAD 100% stenosed, LCx 95% stenosed. Taken for emergent CABG 02/03/18. Required impella post op. Hospital course complicated by cardiogenic shock, HCAP, A fib, respiratory failure, and swallowing issues.  Admit from HF clinic with acute dyspnea.     Assessment/Plan   1. Acute on chronic hypoxic respiratory failure - likely multifactorial due to HF, PNA and recurrent R pleural effusion - PCT 2.07 BNP 2,600 (up from 1,600) - Now improved with diuresis, abx and repeat right thoracentesis - Continue abx - Watch for recurrent pleural effusion. Suspect she may need Pleurex - Co-ox on last admit was 65% arguing against shock. Will need RHC prior to d/c - D/w Dr. Tana Coast  2. Acute on chronic Systolic HF  - has iCM related to large NSTEMI with LAD occlusion requiring urgent CABG 2/19 and Impella supprot -TEE 02/03/18 LVEF 20%, Mild to Mod AR, Mild/Mod MR, RV normal.   - Echo 4/19 EF 25 with moderate aortic insufficiency  - BNP up from baseline on admit.  - Volume status improved.  - Got IV lasix 20 mg this am instead of 40 with soft pressures.  Will hold further IV lasix.  - Continue digoxin 0.125 mg daily - Continue ivabridine 7.5 mg BID - Continue spiro 12.5 mg daily.  - BP had been too low for ACE/ARB/ARNI or b-blocker - Plan RHC prior to d/c.    3. CAD - s/p NSTEMI/STEMIin 2/19 Cath with severe 2v CAD and severe LV dysfunction/ICM. s/p Emergent CABG 02/03/18  requiring Impella support - No s/s of ischemia.    -Continue plavix, asa, atorvastatin.  4.PAF:  CHA2DS2/VASc is at least 4. (CHF, Vasc disease, HTN, Female).  - She has history of Afib RVR in the past in the setting of her Lung CA. Previously on Xarelto but stopped her medications.  - Rate improved today.  - Will continue ASA and Plavix. No anticoagulation with h/o lung caner. - No change to current plan.    5. 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  - Chest CT 02/19/18 with mass-like consolidation in R hilum concerning for recurrent tumor. -Repeat CT 03/27/2018 -No definite findings  of locally recurrent tumor in the right lung. 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 Continued chest CT surveillance is advised in 3-6 months. - cytology from pleural effusion in 4/19 was negative  - Needs follow up with Dr Julien Nordmann.  - No change to current plan.    6. Aortic insufficiency: Moderate-severe on postop echo. Likely potentiating CHF. - Echo 04/20/18 LVEF 20%, Mild/Mod Aortic regurg.   7. Severe protein-calorie malnutrition  - Nutrition consulted.   Fluid status appears improved. Will hold IV lasix. To po today vs tomorrow. (was only on 20 mg daily at home)  Length of Stay: 2  Annamaria Helling  05/07/2018, 8:26 AM  Advanced Heart Failure Team Pager [phone] (M-F; 7a - 4p)  Please contact [facility] for night-coverage after hours (4p -7a ) and weekends on [email or web address]  Patient seen and examined with the above-signed Advanced Practice Provider and/or Housestaff. I personally reviewed laboratory data, imaging studies and relevant notes. I independently examined the patient and formulated the important aspects of the plan. I have edited the note to reflect any of my changes or salient points. I have personally discussed the plan with the patient and/or family.  Overall remains  tenuous but respiratory status has improved. Decompensation likely multifactorial. Volume status looks good. Will switch to po lasix. Continue abx. Await serology from thoracentesis. If effusion recurs will need Pleurex cath. Plan RHC on Thursday.   Glori Bickers, MD  12:51 PM

## 2018-05-07 NOTE — Progress Notes (Signed)
Pt due to receive 40mg  of IV lasix. BP soft at 93/59. Paged on-call provider with findings. Order received for one time dose of 20mg  of IV lasix. Will continue to monitor closely. Jacqlyn Larsen, RN

## 2018-05-07 NOTE — Progress Notes (Signed)
[facility]                                                                              Patient Demographics  Brittany [name], is a 57 y.o. female, DOB - [birth year], CHE:[id number]  Admit date - 05/04/2018   Admitting Physician Vianne Bulls, MD  Outpatient Primary MD for the patient is System, Pcp Not In  Outpatient specialists:   LOS - 2  days   Medical records reviewed and are as summarized below:    Chief Complaint  Patient presents with  . Respiratory Distress       Brief summary   Patient is a 57 year old female with chronic systolic CHF with EF 53%, CAD status post CABG paroxysmal A. fib not on anticoagulation, chronic pain presented with progressive shortness of breath.  Patient was recently discharged on 5/16 after management of acute on chronic CHF.  Patient reported insidious 12 mental worsening dyspnea in the last 2 days, symptomatic at rest on the day of admission.  In ED tachycardiac up to 130 and BP 88/60.  Patient was placed on BiPAP in ED.  Chest x-ray showed moderate right pleural effusion, consolidation in right middle and lower lobes, mild interstitial edema.  Patient was placed on vancomycin and Zosyn, cardiology was consulted, admitted for further work-up.  Assessment & Plan    Principal Problem: Acute on chronic hypoxic respiratory failure secondary to acute on chronic systolic CHF (congestive heart failure) (HCC) and H CAP, right-sided pleural effusion - 2D echo recently showed EF of 20% -Cardiology consulted, patient was placed on IV Lasix for diuresis,digoxin, ivabradine.  ARB on hold due to soft BP -Underwent thoracentesis on 5/26, 900 cc removed, cultures negative -Positive balance 505 cc weight down from 101lbs to 99bs - CHF team following closely, Lasix on hold due to soft BP -Cardiology planning right heart cath prior to DC  Active Problems: HCAP (healthcare associated pneumonia) -Chest x-ray showed right pleural  effusion with associated consolidation in right middle lobe, right lower lung -Procalcitonin 2.0, leukocytosis, continue IV vancomycin and cefepime     Recurrent pleural effusion on right -Status post thoracentesis 5/26, 900 cc removed, if continues to have recurrent pleural effusion, may benefit from Pleurx   Hypotension with history of HTN  -BP low, Lasix placed on hold  History of lung CA -Small cell, status post radiation and chemo in 2015 -Recent thoracentesis with negative cytology, outpatient surveillance CT in 6 months  Paroxysmal atrial fibrillation -History of prior A. fib RVR in the past in the setting of lung CA, was on Xarelto in the past but stopped her medications -Continue aspirin and Plavix, per cardiology no anticoagulation with history of lung CA.  Code Status: Full code DVT Prophylaxis:  Lovenox Family Communication: Discussed in detail with the patient, all imaging results, lab results explained to the patient   Disposition Plan: When medically ready  Time Spent in minutes 25 minutes  Procedures:  None  Consultants:   Cardiology   Antimicrobials:   IV vancomycin 5/25 IV cefepime 5/25  Medications  Scheduled Meds: . aspirin EC  81 mg Oral Daily  . atorvastatin  80 mg Oral q1800  . clopidogrel  75 mg Oral Daily  . digoxin  0.0625 mg Oral Daily  . enoxaparin (LOVENOX) injection  40 mg Subcutaneous Q24H  . feeding supplement (ENSURE ENLIVE)  237 mL Oral TID WC  . ipratropium-albuterol  3 mL Nebulization BID  . ivabradine  7.5 mg Oral BID WC  . sodium chloride flush  3 mL Intravenous Q12H  . [START ON 05/08/2018] spironolactone  12.5 mg Oral QHS   Continuous Infusions: . sodium chloride    . ceFEPime (MAXIPIME) IV Stopped (05/06/18 2230)  . vancomycin Stopped (05/06/18 2345)   PRN Meds:.sodium chloride, acetaminophen, albuterol, docusate sodium, nitroGLYCERIN, ondansetron (ZOFRAN) IV, oxyCODONE, sodium chloride flush   Antibiotics    Anti-infectives (From admission, onward)   Start     Dose/Rate Route Frequency Ordered Stop   05/06/18 2200  ceFEPIme (MAXIPIME) 1 g in sodium chloride 0.9 % 100 mL IVPB     1 g 200 mL/hr over 30 Minutes Intravenous Every 24 hours 05/06/18 1414     05/05/18 2300  vancomycin (VANCOCIN) IVPB 1000 mg/200 mL premix     1,000 mg 200 mL/hr over 60 Minutes Intravenous Every 24 hours 05/05/18 0124 05/12/18 2259   05/05/18 0230  ceFEPIme (MAXIPIME) 1 g in sodium chloride 0.9 % 100 mL IVPB  Status:  Discontinued     1 g 200 mL/hr over 30 Minutes Intravenous Every 12 hours 05/05/18 0101 05/06/18 1414   05/04/18 2245  vancomycin (VANCOCIN) IVPB 1000 mg/200 mL premix     1,000 mg 200 mL/hr over 60 Minutes Intravenous  Once 05/04/18 2244 05/05/18 0022   05/04/18 2115  piperacillin-tazobactam (ZOSYN) IVPB 3.375 g     3.375 g 100 mL/hr over 30 Minutes Intravenous  Once 05/04/18 2115 05/04/18 2229        Subjective:   Brittany [name] was seen and examined today.  Does not feel too good today, feeling weak and had shortness of breath overnight.  No fevers or chills.  No BiPAP needed.  Patient denies dizziness, chest pain, abdominal pain, N/V/D/C, new weakness, numbess, tingling.    Objective:   Vitals:   05/07/18 0615 05/07/18 0615 05/07/18 0938 05/07/18 0948  BP: (!) 93/59 (!) 93/59  (!) 88/62  Pulse:  80 92   Resp:   18   Temp:  98.3 F (36.8 C)    TempSrc:  Oral    SpO2:  100% 98%   Weight:  45 kg (99 lb 3.2 oz)    Height:        Intake/Output Summary (Last 24 hours) at 05/07/2018 1302 Last data filed at 05/07/2018 0949 Gross per 24 hour  Intake 780 ml  Output 1150 ml  Net -370 ml     Wt Readings from Last 3 Encounters:  05/07/18 45 kg (99 lb 3.2 oz)  05/02/18 46.2 kg (101 lb 12.8 oz)  04/25/18 46.1 kg (101 lb 10.1 oz)     Exam   General: Alert and oriented x 3, NAD  Eyes:  HEENT:   Cardiovascular: S1 S2 clear, RRR, no pedal edema b/l  Respiratory:  Decreased breath sound at the bases with rhonchi, no wheezing  Gastrointestinal: Soft, nontender, nondistended, + bowel sounds  Ext: no pedal edema bilaterally  Neuro: no new deficit  Musculoskeletal: No digital cyanosis, clubbing  Skin: No rashes  Psych: Normal affect and demeanor, alert and oriented x3      Data Reviewed:  I have personally reviewed following labs and  imaging studies  Micro Results Recent Results (from the past 240 hour(s))  Blood culture (routine x 2)     Status: None (Preliminary result)   Collection Time: 05/04/18  9:48 PM  Result Value Ref Range Status   Specimen Description BLOOD RIGHT FOREARM  Final   Special Requests   Final    BOTTLES DRAWN AEROBIC ONLY Blood Culture adequate volume   Culture   Final    NO GROWTH 2 DAYS Performed at [facility], [address], [place], [place] [address]    Report Status PENDING  Incomplete  Blood culture (routine x 2)     Status: None (Preliminary result)   Collection Time: 05/04/18  9:50 PM  Result Value Ref Range Status   Specimen Description BLOOD LEFT ARM  Final   Special Requests   Final    BOTTLES DRAWN AEROBIC AND ANAEROBIC Blood Culture adequate volume   Culture   Final    NO GROWTH 2 DAYS Performed at [facility], [address] [place], [place] [address]    Report Status PENDING  Incomplete  Body fluid culture     Status: None (Preliminary result)   Collection Time: 05/05/18 11:06 AM  Result Value Ref Range Status   Specimen Description PLEURAL  Final   Special Requests NONE  Final   Gram Stain   Final    RARE WBC PRESENT, PREDOMINANTLY PMN NO ORGANISMS SEEN    Culture   Final    NO GROWTH 2 DAYS Performed at [facility], [place] [address] [place], [place] [address]    Report Status PENDING  Incomplete    Radiology Reports Dg Chest 1 View  Result Date: 05/05/2018 CLINICAL DATA:  Status post thoracentesis. EXAM: CHEST  1 VIEW COMPARISON:  05/04/2018. FINDINGS:  Cardiac enlargement. Status post CABG procedure. Significant decrease in volume of right pleural effusion. No pneumothorax status post thoracentesis. IMPRESSION: 1. No complications identified status post right thoracentesis. Electronically Signed   By: Kerby Moors M.D.   On: 05/05/2018 11:36   Dg Chest 1 View  Result Date: 04/21/2018 CLINICAL DATA:  Status post right thoracentesis and biopsy today. EXAM: CHEST  1 VIEW COMPARISON:  CT chest 04/20/2017. Single-view of the chest 04/19/2017. FINDINGS: Right pleural effusion is markedly decreased after thoracentesis. No pneumothorax. No left effusion. There is cardiomegaly. Right PICC is now in place with the tip projecting in the mid to lower superior vena cava. Collapse of the right middle lobe is unchanged. Lungs are emphysematous. IMPRESSION: Marked decrease in right pleural effusion after thoracentesis. Negative for pneumothorax or other new abnormality. No change in right middle lobe atelectasis. Emphysema. Cardiomegaly. Electronically Signed   By: Inge Rise M.D.   On: 04/21/2018 15:52   Dg Chest 2 View  Result Date: 04/08/2018 CLINICAL DATA:  Status post coronary artery bypass graft. EXAM: CHEST - 2 VIEW COMPARISON:  Radiographs of February 20, 2018. FINDINGS: Stable cardiomegaly. Status post coronary bypass graft. No pneumothorax is noted. Left lung is clear. Minimal right basilar subsegmental atelectasis or scarring is noted with minimal pleural effusion. Bony thorax is unremarkable. IMPRESSION: Minimal right basilar subsegmental atelectasis or scarring with minimal right pleural effusion. Electronically Signed   By: Marijo Conception, M.D.   On: 04/08/2018 14:47   Ct Chest W Contrast  Result Date: 04/21/2018 CLINICAL DATA:  Shortness of breath. History of small cell lung cancer, myocardial infarction, hypertension. EXAM: CT CHEST WITH CONTRAST TECHNIQUE: Multidetector CT imaging  of the chest was performed during intravenous contrast  administration. CONTRAST:  56mL OMNIPAQUE IOHEXOL 300 MG/ML  SOLN COMPARISON:  Chest radiograph Apr 19, 2018 and CT chest March 27, 2018 FINDINGS: CARDIOVASCULAR: The heart is mildly enlarged and unchanged. No pericardial effusion. Status post median sternotomy for CABG. Stable anterior superiorly directed 17 mm contrast filled out patching from anterior ascending aorta terminating at surgical clips with possible nonenhancing component superiorly (sagittal image 51). Mild calcific atherosclerosis aortic arch, patent arch vessel origins. No central pulmonary embolism though not tailored for evaluation. MEDIASTINUM/NODES: RIGHT hilar and subcarinal lymphadenopathy contiguous RIGHT hilar consolidation. RIGHT PICC. LUNGS/PLEURA: Tracheobronchial tree is patent, no pneumothorax. Hypoenhancing wedge-like consolidation RIGHT middle lobe with enhancing RIGHT lower lobe atelectasis. Enlarging RIGHT perihilar amorphous consolidation. Increasing moderate RIGHT pleural effusion without abnormal pleural enhancement. Stable moderate centrilobular emphysema. UPPER ABDOMEN: Nonacute. MUSCULOSKELETAL: Nonacute. Subcentimeter RIGHT thyroid nodule below size followup recommendations. IMPRESSION: 1. Increasing moderate RIGHT pleural effusion, malignant etiology possible. 2. Increasing RIGHT hilar consolidation (differential diagnosis includes pneumonia, post treatment related changes or recurrent tumor). RIGHT middle lobe atelectasis versus pneumonia. 3. 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 Recommend cardiothoracic surgery consultation. Aortic Atherosclerosis (ICD10-I70.0) and Emphysema (ICD10-J43.9). Electronically Signed   By: Elon Alas M.D.   On: 04/21/2018 00:23   Dg Chest Portable 1 View  Result Date: 05/04/2018 CLINICAL DATA:  Respiratory distress; chest pain EXAM: PORTABLE CHEST 1 VIEW COMPARISON:  May 12 19 FINDINGS: There is a moderate pleural effusion  on the right with consolidation in portions of the right middle and lower lobes. There is underlying mild interstitial pulmonary edema. Heart is mildly enlarged with pulmonary vascularity within normal limits. Patient is status post coronary artery bypass grafting. No. No evident lesions. IMPRESSION: Moderate pleural effusion on the right with consolidation in portions of the right middle and lower lobes. Mild interstitial edema. Cardiac prominence. Postoperative change noted. Electronically Signed   By: Lowella Grip III M.D.   On: 05/04/2018 20:34   Dg Chest Port 1 View  Result Date: 04/19/2018 CLINICAL DATA:  Recent worsening of dyspnea. EXAM: PORTABLE CHEST 1 VIEW COMPARISON:  04/08/2018. FINDINGS: The cardiac silhouette is markedly increased, possible pericardial effusion. Increasing RIGHT pleural effusion as well. Consolidation or atelectasis at the RIGHT base. Median sternotomy for CABG. IMPRESSION: Worsening aeration and increasing size of the cardiac silhouette. See discussion above. Electronically Signed   By: Staci Righter M.D.   On: 04/19/2018 16:35   [place] Ekg Site Rite  Result Date: 04/19/2018 If Site Rite image not attached, placement could not be confirmed due to current cardiac rhythm.  US Thoracentesis Asp Pleural Space W/img Guide  Result Date: 05/05/2018 INDICATION: Patient with right pleural effusion. Request is made for diagnostic and therapeutic thoracentesis. EXAM: ULTRASOUND GUIDED DIAGNOSTIC AND THERAPEUTIC THORACENTESIS MEDICATIONS: 10 mL 1% lidocaine COMPLICATIONS: None immediate. PROCEDURE: An ultrasound guided thoracentesis was thoroughly discussed with the patient and questions answered. The benefits, risks, alternatives and complications were also discussed. The patient understands and wishes to proceed with the procedure. Written consent was obtained. Ultrasound was performed to localize and mark an adequate pocket of fluid in the right chest. The area was then prepped  and draped in the normal sterile fashion. 1% Lidocaine was used for local anesthesia. Under ultrasound guidance a 6 Fr Safe-T-Centesis catheter was introduced. Thoracentesis was performed. The catheter was removed and a dressing applied. FINDINGS: A total of approximately 900 mL of amber fluid was removed. Samples  were sent to the laboratory as requested by the clinical team. IMPRESSION: Successful ultrasound guided diagnostic and therapeutic right thoracentesis yielding 900 mL of pleural fluid. Read by: Brynda Greathouse PA-C No pneumothorax on follow-up chest radiograph. Electronically Signed   By: Lucrezia Europe M.D.   On: 05/05/2018 11:53   US Thoracentesis Asp Pleural Space W/img Guide  Result Date: 04/23/2018 CLINICAL DATA:  57 year old female with pleural effusion EXAM: ULTRASOUND GUIDED THORACENTESIS COMPARISON:  None. PROCEDURE: An ultrasound guided thoracentesis was thoroughly discussed with the patient and questions answered. The benefits, risks, alternatives and complications were also discussed. The patient understands and wishes to proceed with the procedure. Written consent was obtained. Ultrasound was performed to localize and mark an adequate pocket of fluid in the right chest. The area was then prepped and draped in the normal sterile fashion. 1% Lidocaine was used for local anesthesia. Under ultrasound guidance a 19 gauge Yueh catheter was introduced. Thoracentesis was performed. The catheter was removed and a dressing applied. Complications:  None FINDINGS: A total of approximately 480cc of thin yellow fluid was removed. IMPRESSION: Status post US guided right thoracentesis. Signed, Dulcy Fanny. Earleen Newport, DO [facility] Electronically Signed   By: Corrie Mckusick D.O.   On: 04/23/2018 07:35    Lab Data:  CBC: Recent Labs  Lab 05/04/18 2016 05/04/18 2029 05/05/18 0142  WBC 14.1*  --  14.5*  NEUTROABS 11.1*  --  13.1*  HGB 11.4* 13.3  10.2*  HCT 38.3 39.0 33.2*  MCV 91.6  --  88.3  PLT 410*  --  433   Basic Metabolic Panel: Recent Labs  Lab 05/02/18 1126 05/04/18 2029 05/06/18 0325 05/07/18 0242  NA 141 138 138 137  K 4.3 4.7 3.9 4.3  CL 108 105 101 99*  CO2 25  --  26 27  GLUCOSE 87 169* 213* 125*  BUN 18 19 26* 33*  CREATININE 0.86 0.90 1.06* 1.29*  CALCIUM 9.7  --  9.2 9.3   GFR: Estimated Creatinine Clearance: 33.7 mL/min (A) (by C-G formula based on SCr of 1.29 mg/dL (H)). Liver Function Tests: Recent Labs  Lab 05/05/18 0142  AST 35  ALT 45  ALKPHOS 80  BILITOT 0.9  PROT 6.7  ALBUMIN 3.4*   No results for input(s): LIPASE, AMYLASE in the last 168 hours. No results for input(s): AMMONIA in the last 168 hours. Coagulation Profile: No results for input(s): INR, PROTIME in the last 168 hours. Cardiac Enzymes: Recent Labs  Lab 05/06/18 1054 05/06/18 1434 05/06/18 2037 05/07/18 0242  TROPONINI 0.04* 0.03* 0.04* 0.04*   BNP (last 3 results) No results for input(s): PROBNP in the last 8760 hours. HbA1C: No results for input(s): HGBA1C in the last 72 hours. CBG: No results for input(s): GLUCAP in the last 168 hours. Lipid Profile: No results for input(s): CHOL, HDL, LDLCALC, TRIG, CHOLHDL, LDLDIRECT in the last 72 hours. Thyroid Function Tests: No results for input(s): TSH, T4TOTAL, FREET4, T3FREE, THYROIDAB in the last 72 hours. Anemia Panel: No results for input(s): VITAMINB12, FOLATE, FERRITIN, TIBC, IRON, RETICCTPCT in the last 72 hours. Urine analysis:    Component Value Date/Time   COLORURINE YELLOW 02/27/2018 1255   APPEARANCEUR HAZY (A) 02/27/2018 1255   LABSPEC 1.014 02/27/2018 1255   PHURINE 8.0 02/27/2018 [place] 02/27/2018 1255   HGBUR NEGATIVE 02/27/2018 1255   BILIRUBINUR NEGATIVE 02/27/2018 1255   KETONESUR NEGATIVE 02/27/2018 1255   PROTEINUR NEGATIVE 02/27/2018  1255   UROBILINOGEN 1.0 03/15/2015 1117   NITRITE NEGATIVE 02/27/2018 1255    LEUKOCYTESUR SMALL (A) 02/27/2018 1255     Brittany [name] M.D. [facility] 05/07/2018, 1:02 PM  Pager: [phone] Between 7am to 7pm - call Pager - [phone]  After 7pm go to [email or web address] - password [email or web address]  Call night coverage person covering after 7pm

## 2018-05-07 NOTE — Progress Notes (Signed)
SLP Cancellation Note  Patient Details Name: Brittany [name] MRN: [id number] DOB: Jun 20, 1961   Cancelled treatment:        Attempted x 2 (bathing, on phone). Will continue efforts   [name] 05/07/2018, 3:18 PM

## 2018-05-07 NOTE — Clinical Social Work Note (Signed)
Clinical Social Work Assessment  Patient Details  Name: Brittany [name] MRN: [id number] Date of Birth: 02-02-1961  Date of referral:  05/07/18               Reason for consult:  Facility Placement, Discharge Planning                Permission sought to share information with:  Chartered certified accountant granted to share information::  Yes, Verbal Permission Granted  Name::        Agency::  [place]  Relationship::     Contact Information:     Housing/Transportation Living arrangements for the past 2 months:  [place], [place] of Information:  Patient Patient Interpreter Needed:  None Criminal Activity/Legal Involvement Pertinent to Current Situation/Hospitalization:  No - Comment as needed Significant Relationships:  Siblings Lives with:  Siblings Do you feel safe going back to the place where you live?  Yes Need for family participation in patient care:  No (Coment)  Care giving concerns: Patient from [facility] for rehab. Prior to that, was living with sister. PT/OT evaluation pending this admission.    Social Worker assessment / plan: CSW met with patient at bedside. Patient alert and oriented. CSW introduced self and role and discussed disposition planning. Patient confirmed she was at [facility] for rehab. Per chart patient discharged to facility 04/25/18 and readmitted in hospital 05/04/18.  Patient indicated she wants to go back and continue rehab at [facility] if they have a bed on the rehab side of the facility. Patient had a bed on the nursing home side for a few days at last admission. Patient declined to try other facilities. CSW explained process of obtaining new [employer] authorization for patient to readmit to the facility; patient voiced understanding. Patient requires new insurance authorization before readmitting to the facility. Paged MD for PT/OT evaluations. CSW to follow and support with disposition  planning.  Employment status:  Disabled (Comment on whether or not currently receiving Disability) Insurance information:  Managed Care(BCBS) PT Recommendations:  Not assessed at this time Information / Referral to community resources:  [place]  Patient/Family's Response to care: Patient appreciative of care.  Patient/Family's Understanding of and Emotional Response to Diagnosis, Current Treatment, and Prognosis: Patient with understanding of conditions and agreeable to return to facility.  Emotional Assessment Appearance:  Appears stated age Attitude/Demeanor/Rapport:  Engaged Affect (typically observed):  Appropriate, Calm, Accepting Orientation:  Oriented to Self, Oriented to Place, Oriented to  Time, Oriented to Situation Alcohol / Substance use:  Not Applicable Psych involvement (Current and /or in the community):  No (Comment)  Discharge Needs  Concerns to be addressed:  Discharge Planning Concerns, Care Coordination Readmission within the last 30 days:  Yes Current discharge risk:  Physical Impairment Barriers to Discharge:  Continued Medical Work up   Estanislado Emms, LCSW 05/07/2018, 12:03 PM

## 2018-05-07 NOTE — Progress Notes (Signed)
Pharmacy Antibiotic Note  Brittany [name] is a 57 y.o. female admitted on 05/04/2018 with pneumonia.  Pharmacy has been consulted for vancomycin dosing. Vanc trough 11 mcg/ml.  Cultures negative to date  Plan: Change Vancomycin to 1250 mg IV every 24 hours.  F/u renal function, cultures and clinical course  Height: 4' 11.5" (151.1 cm) Weight: 99 lb 3.2 oz (45 kg) IBW/kg (Calculated) : 44.35  Temp (24hrs), Avg:98.5 F (36.9 C), Min:98.3 F (36.8 C), Max:98.7 F (37.1 C)  Recent Labs  Lab 05/02/18 1126 05/04/18 2016 05/04/18 2029 05/05/18 0142 05/06/18 0325 05/07/18 0242 05/07/18 2212  WBC  --  14.1*  --  14.5*  --   --   --   CREATININE 0.86  --  0.90  --  1.06* 1.29*  --   LATICACIDVEN  --   --   --  1.1  --   --   --   VANCOTROUGH  --   --   --   --   --   --  11*    Estimated Creatinine Clearance: 33.7 mL/min (A) (by C-G formula based on SCr of 1.29 mg/dL (H)).    Allergies  Allergen Reactions  . Iodinated Diagnostic Agents Hives and Rash    Needs 13-hour prep before CT scans w/ contrast.  Can do Benadryl 50mg  PO for ESI's, etc  . Latex Hives and Other (See Comments)    Skin irritation  . Pineapple Hives  . Sulfa Antibiotics Hives and Rash  . Codeine Nausea And Vomiting    Thank you for allowing [place] to participate in this patients care.  Elaina Pattee, PharmD Main pharmacy at: [phone] 05/07/2018 11:24 PM

## 2018-05-07 NOTE — H&P (View-Only) (Signed)
Advanced Heart Failure Rounding Note  PCP-Cardiologist: No primary care provider on file.   Subjective:    Readmitted with recurrent respiratory failure. Found to have recurrent R pleural effusion (s/p thoracentesis)  Probable PNA and HF.  PCT 2.07. BNP 2,600 (up from 1,600)  Received IV lasix. Now on abx.   Feeling somewhat better but still weak. Had some SOB overnight but overall improved.Cough improving. No fevers or chills.   Mild lightheadedness with rapid standing.   Cr 1.29. K 4.3. Weight shows up 3 lbs.   Objective:   Weight Range: 99 lb 3.2 oz (45 kg) Body mass index is 19.7 kg/m.   Vital Signs:   Temp:  [98.3 F (36.8 C)-98.5 F (36.9 C)] 98.3 F (36.8 C) (05/28 0615) Pulse Rate:  [70-102] 80 (05/28 0615) Resp:  [18] 18 (05/27 0920) BP: (90-93)/(55-68) 93/59 (05/28 0615) SpO2:  [95 %-100 %] 100 % (05/28 0615) FiO2 (%):  [28 %] 28 % (05/27 0920) Weight:  [99 lb 3.2 oz (45 kg)] 99 lb 3.2 oz (45 kg) (05/28 0615) Last BM Date: (PTA)  Weight change: Filed Weights   05/04/18 2012 05/06/18 0500 05/07/18 0615  Weight: 101 lb (45.8 kg) 96 lb 12.8 oz (43.9 kg) 99 lb 3.2 oz (45 kg)   Intake/Output:   Intake/Output Summary (Last 24 hours) at 05/07/2018 0826 Last data filed at 05/07/2018 0554 Gross per 24 hour  Intake 780 ml  Output 750 ml  Net 30 ml    Physical Exam    General: Thin, weak appearing.  HEENT: Normal anicteric  Neck: Supple. JVP 5-6. Carotids 2+ bilat; no bruits. No thyromegaly or nodule noted. Cor: PMI nondisplaced. RRR, No M/G/R noted Lungs: Diminished. +Rhonchi that clear with cough. No wheezeAbdomen: Soft, non-tender, non-distended, no HSM. No bruits or masses. +BS  Extremities: no cyanosis, clubbing, rash, edema Neuro: alert & oriented x 3, cranial nerves grossly intact. moves all 4 extremities w/o difficulty. Affect pleasant   Telemetry   NSR 80-90s, personally reviewed.   Labs    CBC Recent Labs    05/04/18 2016  05/04/18 2029 05/05/18 0142  WBC 14.1*  --  14.5*  NEUTROABS 11.1*  --  13.1*  HGB 11.4* 13.3 10.2*  HCT 38.3 39.0 33.2*  MCV 91.6  --  88.3  PLT 410*  --  660   Basic Metabolic Panel Recent Labs    05/06/18 0325 05/07/18 0242  NA 138 137  K 3.9 4.3  CL 101 99*  CO2 26 27  GLUCOSE 213* 125*  BUN 26* 33*  CREATININE 1.06* 1.29*  CALCIUM 9.2 9.3   Liver Function Tests Recent Labs    05/05/18 0142  AST 35  ALT 45  ALKPHOS 80  BILITOT 0.9  PROT 6.7  ALBUMIN 3.4*   No results for input(s): LIPASE, AMYLASE in the last 72 hours. Cardiac Enzymes Recent Labs    05/06/18 1434 05/06/18 2037 05/07/18 0242  TROPONINI 0.03* 0.04* 0.04*    BNP: BNP (last 3 results) Recent Labs    02/15/18 0449 04/19/18 1218 05/05/18 0142  BNP 2,526.7* 1,663.5* 2,601.1*    ProBNP (last 3 results) No results for input(s): PROBNP in the last 8760 hours.   D-Dimer No results for input(s): DDIMER in the last 72 hours. Hemoglobin A1C No results for input(s): HGBA1C in the last 72 hours. Fasting Lipid Panel No results for input(s): CHOL, HDL, LDLCALC, TRIG, CHOLHDL, LDLDIRECT in the last 72 hours. Thyroid Function Tests No results for  input(s): TSH, T4TOTAL, T3FREE, THYROIDAB in the last 72 hours.  Invalid input(s): FREET3  Other results:   Imaging    No results found.   Medications:     Scheduled Medications: . aspirin EC  81 mg Oral Daily  . atorvastatin  80 mg Oral q1800  . clopidogrel  75 mg Oral Daily  . digoxin  0.0625 mg Oral Daily  . enoxaparin (LOVENOX) injection  40 mg Subcutaneous Q24H  . feeding supplement (ENSURE ENLIVE)  237 mL Oral TID WC  . furosemide  40 mg Intravenous Q12H  . ipratropium-albuterol  3 mL Nebulization BID  . ivabradine  7.5 mg Oral BID WC  . sodium chloride flush  3 mL Intravenous Q12H  . spironolactone  12.5 mg Oral Daily    Infusions: . sodium chloride    . ceFEPime (MAXIPIME) IV Stopped (05/06/18 2230)  . 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   Recent admit with NSTEMI and shock. Underwent emergent cath 02/03/18 showed LAD 100% stenosed, LCx 95% stenosed. Taken for emergent CABG 02/03/18. Required impella post op. Hospital course complicated by cardiogenic shock, HCAP, A fib, respiratory failure, and swallowing issues.  Admit from HF clinic with acute dyspnea.     Assessment/Plan   1. Acute on chronic hypoxic respiratory failure - likely multifactorial due to HF, PNA and recurrent R pleural effusion - PCT 2.07 BNP 2,600 (up from 1,600) - Now improved with diuresis, abx and repeat right thoracentesis - Continue abx - Watch for recurrent pleural effusion. Suspect she may need Pleurex - Co-ox on last admit was 65% arguing against shock. Will need RHC prior to d/c - D/w Dr. Tana Coast  2. Acute on chronic Systolic HF  - has iCM related to large NSTEMI with LAD occlusion requiring urgent CABG 2/19 and Impella supprot -TEE 02/03/18 LVEF 20%, Mild to Mod AR, Mild/Mod MR, RV normal.   - Echo 4/19 EF 25 with moderate aortic insufficiency  - BNP up from baseline on admit.  - Volume status improved.  - Got IV lasix 20 mg this am instead of 40 with soft pressures.  Will hold further IV lasix.  - Continue digoxin 0.125 mg daily - Continue ivabridine 7.5 mg BID - Continue spiro 12.5 mg daily.  - BP had been too low for ACE/ARB/ARNI or b-blocker - Plan RHC prior to d/c.    3. CAD - s/p NSTEMI/STEMIin 2/19 Cath with severe 2v CAD and severe LV dysfunction/ICM. s/p Emergent CABG 02/03/18  requiring Impella support - No s/s of ischemia.    -Continue plavix, asa, atorvastatin.  4.PAF:  CHA2DS2/VASc is at least 4. (CHF, Vasc disease, HTN, Female).  - She has history of Afib RVR in the past in the setting of her Lung CA. Previously on Xarelto but stopped her medications.  - Rate improved today.  - Will continue ASA and Plavix. No anticoagulation with h/o lung caner. - No change to current plan.    5. 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  - Chest CT 02/19/18 with mass-like consolidation in R hilum concerning for recurrent tumor. -Repeat CT 03/27/2018 -No definite findings  of locally recurrent tumor in the right lung. 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 Continued chest CT surveillance is advised in 3-6 months. - cytology from pleural effusion in 4/19 was negative  - Needs follow up with Dr Julien Nordmann.  - No change to current plan.    6. Aortic insufficiency: Moderate-severe on postop echo. Likely potentiating CHF. - Echo 04/20/18 LVEF 20%, Mild/Mod Aortic regurg.   7. Severe protein-calorie malnutrition  - Nutrition consulted.   Fluid status appears improved. Will hold IV lasix. To po today vs tomorrow. (was only on 20 mg daily at home)  Length of Stay: 2  Annamaria Helling  05/07/2018, 8:26 AM  Advanced Heart Failure Team Pager [phone] (M-F; 7a - 4p)  Please contact [facility] for night-coverage after hours (4p -7a ) and weekends on [email or web address]  Patient seen and examined with the above-signed Advanced Practice Provider and/or Housestaff. I personally reviewed laboratory data, imaging studies and relevant notes. I independently examined the patient and formulated the important aspects of the plan. I have edited the note to reflect any of my changes or salient points. I have personally discussed the plan with the patient and/or family.  Overall remains  tenuous but respiratory status has improved. Decompensation likely multifactorial. Volume status looks good. Will switch to po lasix. Continue abx. Await serology from thoracentesis. If effusion recurs will need Pleurex cath. Plan RHC on Thursday.   Glori Bickers, MD  12:51 PM

## 2018-05-08 DIAGNOSIS — I480 Paroxysmal atrial fibrillation: Secondary | ICD-10-CM

## 2018-05-08 DIAGNOSIS — J189 Pneumonia, unspecified organism: Secondary | ICD-10-CM

## 2018-05-08 LAB — BASIC METABOLIC PANEL
Anion gap: 9 (ref 5–15)
BUN: 31 mg/dL — AB (ref 6–20)
CALCIUM: 9.7 mg/dL (ref 8.9–10.3)
CO2: 31 mmol/L (ref 22–32)
CREATININE: 1.04 mg/dL — AB (ref 0.44–1.00)
Chloride: 98 mmol/L — ABNORMAL LOW (ref 101–111)
GFR calc Af Amer: >60 mL/min (ref >60)
GFR, EST NON AFRICAN AMERICAN: 58 mL/min — AB (ref >60)
Glucose, Bld: 120 mg/dL — ABNORMAL HIGH (ref 65–99)
Potassium: 4.4 mmol/L (ref 3.5–5.1)
SODIUM: 138 mmol/L (ref 135–145)

## 2018-05-08 LAB — BODY FLUID CULTURE: Culture: NO GROWTH

## 2018-05-08 MED ORDER — GUAIFENESIN-DM 100-10 MG/5ML PO SYRP
5.0000 mL | ORAL_SOLUTION | ORAL | Status: DC | PRN
Start: 2018-05-08 — End: 2018-05-10
  Administered 2018-05-08: 5 mL via ORAL
  Filled 2018-05-08 (×2): qty 5

## 2018-05-08 MED ORDER — SODIUM CHLORIDE 0.9 % IV SOLN
INTRAVENOUS | Status: DC
  Administered 2018-05-08: via INTRAVENOUS

## 2018-05-08 MED ORDER — SODIUM CHLORIDE 0.9% FLUSH: 3.0000 mL | INTRAVENOUS | Status: DC | PRN

## 2018-05-08 MED ORDER — SODIUM CHLORIDE 0.9 % IV SOLN: 250.0000 mL | INTRAVENOUS | Status: DC | PRN

## 2018-05-08 MED ORDER — SODIUM CHLORIDE 0.9% FLUSH
3.0000 mL | Freq: Two times a day (BID) | INTRAVENOUS | Status: DC
  Administered 2018-05-09: 3 mL via INTRAVENOUS

## 2018-05-08 MED ORDER — FUROSEMIDE 20 MG PO TABS
20.0000 mg | ORAL_TABLET | Freq: Every day | ORAL | Status: DC
Start: 2018-05-08 — End: 2018-05-10
  Administered 2018-05-08 – 2018-05-10 (×3): 20 mg via ORAL
  Filled 2018-05-08 (×3): qty 1

## 2018-05-08 NOTE — Progress Notes (Signed)
[facility] PROGRESS NOTE  Brittany [name] GQQ:[id number] DOB: [birth year] DOA: 05/04/2018  PCP: System, Pcp Not In  Brief History/Interval Summary: 57 year old female with chronic systolic CHF with EF 67%, CAD status post recent CABG paroxysmal A. fib not on anticoagulation, chronic pain presented with progressive shortness of breath.  Patient was recently discharged on 5/16 after management of acute on chronic CHF.  Patient was placed on BiPAP in ED.  Chest x-ray showed moderate right pleural effusion, consolidation in right middle and lower lobes, mild interstitial edema.  Patient was placed on vancomycin and Zosyn, cardiology was consulted, admitted for further work-up.  Patient underwent thoracentesis.  Reason for Visit: Acute systolic CHF  Consultants: Heart failure team  Procedures: Thoracentesis  Antibiotics: Vancomycin and cefepime 5/25  Subjective/Interval History: She states that she feels much better.  Shortness of breath is improved.  Still somewhat fatigued.  ROS: Denies any chest pain.  No nausea or vomiting.  Objective:  Vital Signs  Vitals:   05/07/18 2006 05/07/18 2050 05/07/18 2301 05/08/18 0514  BP: (!) 88/51   (!) 89/52  Pulse: 65  66 75  Resp: 16  (!) 22 16  Temp: 98.7 F (37.1 C)   97.7 F (36.5 C)  TempSrc: Oral     SpO2: 100% 100% 100% 100%  Weight:    45.3 kg (99 lb 12.8 oz)  Height:        Intake/Output Summary (Last 24 hours) at 05/08/2018 1147 Last data filed at 05/08/2018 0900 Gross per 24 hour  Intake 1080 ml  Output 600 ml  Net 480 ml   Filed Weights   05/06/18 0500 05/07/18 0615 05/08/18 0514  Weight: 43.9 kg (96 lb 12.8 oz) 45 kg (99 lb 3.2 oz) 45.3 kg (99 lb 12.8 oz)    General appearance: alert, cooperative, appears stated age and no distress Head: Normocephalic, without obvious abnormality, atraumatic Resp: Normal effort at rest.  Few crackles at the bases.  No wheezing or rhonchi. Cardio: regular rate and  rhythm, S1, S2 normal, no murmur, click, rub or gallop GI: soft, non-tender; bowel sounds normal; no masses,  no organomegaly Extremities: extremities normal, atraumatic, no cyanosis or edema Neurologic: No focal neurological deficits.  Lab Results:  Data Reviewed: I have personally reviewed following labs and imaging studies  CBC: Recent Labs  Lab 05/04/18 2016 05/04/18 2029 05/05/18 0142  WBC 14.1*  --  14.5*  NEUTROABS 11.1*  --  13.1*  HGB 11.4* 13.3 10.2*  HCT 38.3 39.0 33.2*  MCV 91.6  --  88.3  PLT 410*  --  124    Basic Metabolic Panel: Recent Labs  Lab 05/02/18 1126 05/04/18 2029 05/06/18 0325 05/07/18 0242 05/08/18 0619  NA 141 138 138 137 138  K 4.3 4.7 3.9 4.3 4.4  CL 108 105 101 99* 98*  CO2 25  --  26 27 31   GLUCOSE 87 169* 213* 125* 120*  BUN 18 19 26* 33* 31*  CREATININE 0.86 0.90 1.06* 1.29* 1.04*  CALCIUM 9.7  --  9.2 9.3 9.7    GFR: Estimated Creatinine Clearance: 41.8 mL/min (A) (by C-G formula based on SCr of 1.04 mg/dL (H)).  Liver Function Tests: Recent Labs  Lab 05/05/18 0142  AST 35  ALT 45  ALKPHOS 80  BILITOT 0.9  PROT 6.7  ALBUMIN 3.4*    Cardiac Enzymes: Recent Labs  Lab 05/06/18 1054 05/06/18 1434 05/06/18 2037 05/07/18 0242  TROPONINI 0.04* 0.03* 0.04* 0.04*  Recent Results (from the past 240 hour(s))  Blood culture (routine x 2)     Status: None (Preliminary result)   Collection Time: 05/04/18  9:48 PM  Result Value Ref Range Status   Specimen Description BLOOD RIGHT FOREARM  Final   Special Requests   Final    BOTTLES DRAWN AEROBIC ONLY Blood Culture adequate volume   Culture   Final    NO GROWTH 3 DAYS Performed at [facility], [address] [place], [place] [address]    Report Status PENDING  Incomplete  Blood culture (routine x 2)     Status: None (Preliminary result)   Collection Time: 05/04/18  9:50 PM  Result Value Ref Range Status   Specimen Description BLOOD LEFT ARM  Final    Special Requests   Final    BOTTLES DRAWN AEROBIC AND ANAEROBIC Blood Culture adequate volume   Culture   Final    NO GROWTH 3 DAYS Performed at [facility], [address] [place], [place] [address]    Report Status PENDING  Incomplete  Body fluid culture     Status: None   Collection Time: 05/05/18 11:06 AM  Result Value Ref Range Status   Specimen Description PLEURAL  Final   Special Requests NONE  Final   Gram Stain   Final    RARE WBC PRESENT, PREDOMINANTLY PMN NO ORGANISMS SEEN    Culture   Final    NO GROWTH 3 DAYS Performed at [facility], [place] [place], [place] [address]    Report Status 05/08/2018 FINAL  Final      Radiology Studies: No results found.   Medications:  Scheduled: . aspirin EC  81 mg Oral Daily  . atorvastatin  80 mg Oral q1800  . clopidogrel  75 mg Oral Daily  . digoxin  0.0625 mg Oral Daily  . enoxaparin (LOVENOX) injection  40 mg Subcutaneous Q24H  . feeding supplement (ENSURE ENLIVE)  237 mL Oral TID WC  . furosemide  20 mg Oral Daily  . ipratropium-albuterol  3 mL Nebulization BID  . ivabradine  7.5 mg Oral BID WC  . sodium chloride flush  3 mL Intravenous Q12H  . sodium chloride flush  3 mL Intravenous Q12H  . spironolactone  12.5 mg Oral QHS   Continuous: . sodium chloride    . sodium chloride    . [START ON 05/09/2018] sodium chloride    . ceFEPime (MAXIPIME) IV Stopped (05/07/18 2200)  . vancomycin Stopped (05/08/18 0117)   SFK:CLEXNT chloride, sodium chloride, acetaminophen, albuterol, docusate sodium, nitroGLYCERIN, ondansetron (ZOFRAN) IV, oxyCODONE, sodium chloride flush, sodium chloride flush  Assessment/Plan:    Acute on chronic respiratory failure with hypoxia Secondary to acute systolic CHF.  Patient has improved.  Was initially on BiPAP.  Continue oxygen as tolerated.  Maintain sats greater than 90%.  Acute systolic CHF/right-sided pleural effusion Patient underwent thoracentesis with removal  of 900 mL of fluid.  Cultures have been negative.  Patient was placed on IV diuretics.  Heart failure team was consulted.  Blood pressure remains low.  Patient is noted to be on digoxin and ivabradine and spironolactone.  No ACEI/BB/ARB due to soft blood pressure.  Recent echocardiogram showed EF to be 20%.  Cardiology plans right heart catheterization on 5/30.  Started back on low-dose Lasix today.  History of coronary artery disease Patient was found to have severe two-vessel disease in February and underwent emergent CABG. continue aspirin  Plavix statin.  Healthcare associated pneumonia Chest x-ray showed right-sided pleural effusion with associated consolidation in the right middle lobe and right lower lung.  Procalcitonin was 2.0.  Patient had leukocytosis.  Started on IV vancomycin and cefepime.  Cultures have been negative.  Stop vancomycin.  Repeat procalcitonin levels tomorrow.  Repeat WBC tomorrow.   Recurrent right-sided pleural effusion Status post thoracentesis.  Will need continued monitoring.  May need Pleurx if she continues to have recurrent accumulation.  Hypotension Blood pressure remains low.  Continue to monitor.  Patient appears to be asymptomatic.  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  She is status post radiation and chemotherapy in 2015.  Recent thoracenteses was with negative cytology.  Outpatient surveillance CT scan in 6 months.  Paroxysmal atrial fibrillation Prior history of rapid A. fib.  Patient was previously on Xarelto but she stopped her medications.  Continue aspirin Plavix for now.  No anticoagulation per cardiology.  Severe protein calorie malnutrition Nutrition is following.  Mild acute renal failure Creatinine went up to 1.29.  Improved to 1.04 today.  Continue to monitor.  DVT Prophylaxis: Lovenox    Code Status: Full code Family Communication: Discussed with the patient Disposition Plan: Management as outlined above.  Right heart  catheterization tomorrow.    LOS: 3 days   [facility] Pager [phone] 05/08/2018, 11:47 AM  If 7PM-7AM, please contact night-coverage at [email or web address], password [email or web address]

## 2018-05-08 NOTE — Evaluation (Signed)
Occupational Therapy Evaluation Patient Details Name: Brittany [name] MRN: [id number] DOB: 1961/04/19 Today's Date: 05/08/2018    History of Present Illness 57 yo admitted 05/04/18 with recurrent respiratory failure/PNA requiring Bipap in ED. PMHx: emergent CAbG x3 2/24 with D/C to sNF then home, lung CA, HTN, PAF, CHF, asthma, anxiety   Clinical Impression   Pt initially resistant to OOB, likes to have purewick in place so she doesn't have incontinence. RN placed pt on portable telemetry and OT removed purewick. Pt demonstrated ability to ambulate to the bathroom and perform ADL modified independently. Educated in energy conservation strategies with pt verbalizing understanding. Pt with Sp02 of 100% on RA at rest and after ambulating a short distance in room. No further OT needs.    Follow Up Recommendations  No OT follow up    Equipment Recommendations  None recommended by OT    Recommendations for Other Services       Precautions / Restrictions        Mobility Bed Mobility Overal bed mobility: Modified Independent             General bed mobility comments: moves quickly due to frustration with having to work with therapy  Transfers Overall transfer level: Independent Equipment used: None             General transfer comment: pt placed on portable telemetry and pure wick removed to encourage OOB to chair and bathroom    Balance     Sitting balance-Leahy Scale: Normal       Standing balance-Leahy Scale: Good Standing balance comment: reaches for furniture when walking, but no LOB                           ADL either performed or assessed with clinical judgement   ADL Overall ADL's : Modified independent                                       General ADL Comments: Educated in energy conservation strategies. Pt with Sp02 of 100% on RA.     Vision Patient Visual Report: No change from baseline       Perception      Praxis      Pertinent Vitals/Pain Pain Assessment: No/denies pain     Hand Dominance Right   Extremity/Trunk Assessment Upper Extremity Assessment Upper Extremity Assessment: Overall WFL for tasks assessed   Lower Extremity Assessment Lower Extremity Assessment: Defer to PT evaluation   Cervical / Trunk Assessment Cervical / Trunk Assessment: Normal   Communication Communication Communication: No difficulties   Cognition Arousal/Alertness: Awake/alert Behavior During Therapy: WFL for tasks assessed/performed Overall Cognitive Status: Within Functional Limits for tasks assessed                                 General Comments: short tempered when OT removed purewick and asked nursing to place pt on portable telemetry so she can take herself to the bathroom   General Comments       Exercises     Shoulder Instructions      Home Living Family/patient expects to be discharged to:: Private residence Living Arrangements: Other relatives(sister) Available Help at Discharge: Family;Available PRN/intermittently Type of Home: Other(Comment) Home Access: Other (comment)(townhouse) Entrance Stairs-Number of Steps: 1   Home Layout: Two  level;1/2 bath on main level   Alternate Level Stairs-Rails: Right;Left Bathroom Shower/Tub: Teacher, early years/pre: Standard     Home Equipment: Environmental consultant - 2 wheels   Additional Comments: lives with sister, sleeps sitting up on sofa down stairs , has been sponge bathing      Prior Functioning/Environment Level of Independence: Independent        Comments: used walker outside of home only        OT Problem List:        OT Treatment/Interventions:      OT Goals(Current goals can be found in the care plan section) Acute Rehab OT Goals Patient Stated Goal: to get stronger  OT Frequency:     Barriers to D/C:            Co-evaluation              AM-PAC PT "6 Clicks" Daily Activity      Outcome Measure Help from another person eating meals?: None Help from another person taking care of personal grooming?: None Help from another person toileting, which includes using toliet, bedpan, or urinal?: None Help from another person bathing (including washing, rinsing, drying)?: None Help from another person to put on and taking off regular upper body clothing?: None Help from another person to put on and taking off regular lower body clothing?: None 6 Click Score: 24   End of Session Equipment Utilized During Treatment: Gait belt Nurse Communication: Mobility status(02 sats 100% on RA with walking to and from bathroom)  Activity Tolerance: Patient tolerated treatment well Patient left: in chair;with call bell/phone within reach                   Time: 0928-1007 OT Time Calculation (min): 39 min Charges:  OT General Charges $OT Visit: 1 Visit OT Evaluation $OT Eval Low Complexity: 1 Low OT Treatments $Self Care/Home Management : 23-37 mins G-Codes:     05-28-2018 Nestor Lewandowsky, OTR/L Pager: [place], [place] 28-May-2018, 10:15 AM

## 2018-05-08 NOTE — Plan of Care (Signed)
Pt recalls swallow strategies independently and states she is able to implement as needed. Tolerated all consistencies trialed with now clinical s/s of aspiration.

## 2018-05-08 NOTE — Progress Notes (Signed)
Patient's facility, [facility], to start [facility] authorization today, but will require PT/OT notes- PT/OT evals pending. Patient will require auth before admitting to the facility. CSW to follow and support with discharge when medically ready and auth received.   Estanislado Emms, [place]

## 2018-05-08 NOTE — Progress Notes (Signed)
CSW noted PT recommendation for home health. Patient ambulating 120 feet and therefore unlikely to be approved by insurance for return to SNF.   CSW met with patient at bedside and dicussed therapy evaluation and recommendations. CSW advised patient that [employer] unlikely to approve continued rehab at [facility]. CSW also confirmed with Juliann Pulse in admissions at [facility], facility patient was at previously, and they indicated they would not be able to get authorization with patient ambulating as she is.   Patient was hopeful for SNF but understanding of unlikely insurance authorization and agreeable to go home. Patient declined home health services, stating she already has too many services in place at her sister's home.  CSW signing off, as no additional needs identified at this time.  Estanislado Emms, [place]

## 2018-05-08 NOTE — Care Management Note (Signed)
Case Management Note  Patient Details  Name: Brittany [name] MRN: [id number] Date of Birth: 1960-12-27  Subjective/Objective: Pt presented for SOB/CP- Acute on Chronic Systolic CHF. PTA pt was from [facility]. PT/OT did consult and recommendation for [facility]. CSW did consult with patient and pt is ambulating 120 ft per PT notes (insurance may not approve SNF stay due to how far patient ambulated). CM spoke with pt in regards to disposition needs: Pt is declining [facility] @ this time because she lives with her sister and does not want any additional services in the home.  Pt has DME 02 in the home. Pt unable to think of DME companies name.                  Action/Plan: Heart Failure Paramedicine Team will be following the patient once she transitions home. Paramedicine to assist with medications. No further needs from CM at this time.   Expected Discharge Date:                  Expected Discharge Plan:  Home/Self Care  In-House Referral:  Clinical Social Work  Discharge planning Services  CM Consult, Medication Assistance  Post Acute Care Choice:  NA Choice offered to:  NA  DME Arranged:  N/A DME Agency:  NA  HH Arranged:  NA HH Agency:  NA  Status of Service:  Completed, signed off  If discussed at [place] of Stay Meetings, dates discussed:    Additional Comments:  Bethena Roys, RN 05/08/2018, 3:05 PM

## 2018-05-08 NOTE — Progress Notes (Signed)
  Speech Language Pathology Treatment: Dysphagia  Patient Details Name: Brittany [name] MRN: [id number] DOB: Jul 30, 1961 Today's Date: 05/08/2018 Time: 7673-4193 SLP Time Calculation (min) (ACUTE ONLY): 24 min  Assessment / Plan / Recommendation Clinical Impression  Pt tolerated thin liquid and regular texture solids today with no clinical s/s of aspiration.  Pt was able to independently explain swallow compensatory strategies and oral care recommendations and reports no difficulty with PO intake.  Introduced swallowing exercises (CTAR, Isometric Breath Hold, Mendelssohn, Expiration Against Resistance; Shaker Head Lift and Shaker Head Hold verbal explanation) to improve pharyngeal strength and hyolaryngeal excursion.  Pt demonstrated ability to complete pharyngeal exercises with good accuracy, although there was some difficulty with execution of Mendelssohn and pt may benefit from additional training.   HPI HPI: Patient is a 57 year old female with chronic systolic CHF with EF 79%, CAD status post CABG paroxysmal A. fib not on anticoagulation, chronic pain presented with progressive shortness of breath. Hx previous small cell lung cancer treated with chemo, chest XRT and prophylacticbrain radiation in 2015. Patient was recently discharged on 5/16 after management of acute on chronic CHF.  Patient was placed on BiPAP in ED.  Chest x-ray showed moderate right pleural effusion, consolidation in right middle and lower lobes, mild interstitial edema.  Patient was placed on vancomycin and Zosyn, cardiology was consulted, admitted for further work-up. s/p thoracentesis yielding 900 mL of pleural fluid. Pt had 2 MBS during prior admission 02/2018 with similar findings of silent transient aspiration and mistiming of protective mechanisms; ? of recurrent larygneal nerve involvement s/p radiation for lung cancer. Ultimately upgraded from nectar to thin liquids with cough after liquids for airway clearance.        SLP Plan  Continue with current plan of care       Recommendations  Diet recommendations: Regular;Thin liquid Liquids provided via: Cup;Straw Medication Administration: (No specific precautions) Supervision: Patient able to self feed Compensations: Hard cough after swallow;Slow rate;Small sips/bites Postural Changes and/or Swallow Maneuvers: Seated upright 90 degrees                Oral Care Recommendations: Oral care before and after PO Follow up Recommendations: (Pt may benefit from continued ST at next level of care) SLP Visit Diagnosis: Dysphagia, oropharyngeal phase (R13.12) Plan: Continue with current plan of care                       Celedonio Savage, [place], [place], Pager [phone] 05/08/2018, 11:58 AM

## 2018-05-08 NOTE — Progress Notes (Addendum)
Advanced Heart Failure Rounding Note  PCP-Cardiologist: No primary care provider on file.   Subjective:    Readmitted with recurrent respiratory failure. Found to have recurrent R pleural effusion (s/p thoracentesis)  Probable PNA and HF.  PCT 2.07. BNP 2,600 (up from 1,600)  Remains on ABX.    Feeling OK this am. Breathing better. Denies SOB. Hasn't been out of bed very much. Denies dizziness or lightheadedness with standing. No CP.   Cytology for thoracentesis was negative  BMET pending.      Objective:   Weight Range: 99 lb 12.8 oz (45.3 kg) Body mass index is 19.82 kg/m.   Vital Signs:   Temp:  [97.7 F (36.5 C)-98.7 F (37.1 C)] 97.7 F (36.5 C) (05/29 0514) Pulse Rate:  [65-92] 75 (05/29 0514) Resp:  [16-22] 16 (05/29 0514) BP: (88-93)/(51-62) 89/52 (05/29 0514) SpO2:  [98 %-100 %] 100 % (05/29 0514) FiO2 (%):  [28 %] 28 % (05/28 0938) Weight:  [99 lb 12.8 oz (45.3 kg)] 99 lb 12.8 oz (45.3 kg) (05/29 0514) Last BM Date: (PTA)  Weight change: Filed Weights   05/06/18 0500 05/07/18 0615 05/08/18 0514  Weight: 96 lb 12.8 oz (43.9 kg) 99 lb 3.2 oz (45 kg) 99 lb 12.8 oz (45.3 kg)   Intake/Output:   Intake/Output Summary (Last 24 hours) at 05/08/2018 0752 Last data filed at 05/08/2018 0500 Gross per 24 hour  Intake 960 ml  Output 1000 ml  Net -40 ml    Physical Exam    General: Thin, weak appearing. NAD  HEENT: Normal anicterinf Neck: Supple. JVP 5-6. Carotids 2+ bilat; no bruits. No thyromegaly or nodule noted. Cor: PMI nondisplaced. RRR, No M/G/R noted Lungs: Diminished. But clear  Abdomen: Soft, non-tender, non-distended, no HSM. No bruits or masses. +BS  Extremities: no cyanosis, clubbing, rash, edema Neuro: alert & oriented x 3, cranial nerves grossly intact. moves all 4 extremities w/o difficulty. Affect pleasant  Telemetry   NSR 80-90s Personally reviewed   Labs    CBC No results for input(s): WBC, NEUTROABS, HGB, HCT, MCV, PLT in  the last 72 hours. Basic Metabolic Panel Recent Labs    05/06/18 0325 05/07/18 0242  NA 138 137  K 3.9 4.3  CL 101 99*  CO2 26 27  GLUCOSE 213* 125*  BUN 26* 33*  CREATININE 1.06* 1.29*  CALCIUM 9.2 9.3   Liver Function Tests No results for input(s): AST, ALT, ALKPHOS, BILITOT, PROT, ALBUMIN in the last 72 hours. No results for input(s): LIPASE, AMYLASE in the last 72 hours. Cardiac Enzymes Recent Labs    05/06/18 1434 05/06/18 2037 05/07/18 0242  TROPONINI 0.03* 0.04* 0.04*    BNP: BNP (last 3 results) Recent Labs    02/15/18 0449 04/19/18 1218 05/05/18 0142  BNP 2,526.7* 1,663.5* 2,601.1*    ProBNP (last 3 results) No results for input(s): PROBNP in the last 8760 hours.   D-Dimer No results for input(s): DDIMER in the last 72 hours. Hemoglobin A1C No results for input(s): HGBA1C in the last 72 hours. Fasting Lipid Panel No results for input(s): CHOL, HDL, LDLCALC, TRIG, CHOLHDL, LDLDIRECT in the last 72 hours. Thyroid Function Tests No results for input(s): TSH, T4TOTAL, T3FREE, THYROIDAB in the last 72 hours.  Invalid input(s): FREET3  Other results:   Imaging    No results found.   Medications:     Scheduled Medications: . aspirin EC  81 mg Oral Daily  . atorvastatin  80 mg Oral q1800  .  clopidogrel  75 mg Oral Daily  . digoxin  0.0625 mg Oral Daily  . enoxaparin (LOVENOX) injection  40 mg Subcutaneous Q24H  . feeding supplement (ENSURE ENLIVE)  237 mL Oral TID WC  . ipratropium-albuterol  3 mL Nebulization BID  . ivabradine  7.5 mg Oral BID WC  . sodium chloride flush  3 mL Intravenous Q12H  . spironolactone  12.5 mg Oral QHS    Infusions: . sodium chloride    . ceFEPime (MAXIPIME) IV Stopped (05/07/18 2200)  . vancomycin Stopped (05/08/18 0117)    PRN Medications: sodium chloride, acetaminophen, albuterol, docusate sodium, nitroGLYCERIN, ondansetron (ZOFRAN) IV, oxyCODONE, sodium chloride flush    Patient Profile   Brittany [name] a 57 y/o woman with a history of PAF,preveious smoker quit 4 years ago, hypertension, 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   Recent admit with NSTEMI and shock. Underwent emergent cath 02/03/18 showed LAD 100% stenosed, LCx 95% stenosed. Taken for emergent CABG 02/03/18. Required impella post op. Hospital course complicated by cardiogenic shock, HCAP, A fib, respiratory failure, and swallowing issues.  Admit from HF clinic with acute dyspnea.   Assessment/Plan   1. Acute on chronic hypoxic respiratory failure - likely multifactorial due to HF, PNA and recurrent R pleural effusion - PCT 2.07 BNP 2,600 (up from 1,600) - Now improved with diuresis, abx and repeat right thoracentesis - Continue abx - Watch for recurrent pleural effusion. Suspect she may need Pleurex in the future.  - Co-ox on last admit was 65% arguing against shock. Plan for RHC tomorrow.   2. Acute on chronic Systolic HF  - has iCM related to large NSTEMI with LAD occlusion requiring urgent CABG 2/19 and Impella supprot -TEE 02/03/18 LVEF 20%, Mild to Mod AR, Mild/Mod MR, RV normal.   - Echo 4/19 EF 25 with moderate aortic insufficiency  - BNP up from baseline on admit.  - Volume status stable on exam - Resume lasix po at 20 mg daily. Can increase as needed.  - Continue digoxin 0.125 mg daily - Continue ivabridine 7.5 mg BID - Continue spiro 12.5 mg daily.  - BP had been too low for ACE/ARB/ARNI or b-blocker - Plan RHC 05/09/18.   3. CAD - s/p NSTEMI/STEMIin 2/19 Cath with severe 2v CAD and severe LV dysfunction/ICM. s/p Emergent CABG 02/03/18 requiring Impella support - No s/s of ischemia.    -Continue plavix, asa, atorvastatin.  4.PAF:  CHA2DS2/VASc is at least 4. (CHF, Vasc disease, HTN, Female).  - She has history of Afib RVR in the past in the setting of her Lung CA. Previously on  Xarelto but stopped her medications.  - Rate stable.   - Will continue ASA and Plavix. No anticoagulation with h/o lung caner. - No change to current plan.    5. 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  - Chest CT 02/19/18 with mass-like consolidation in R hilum concerning for recurrent tumor. -Repeat CT 03/27/2018 -No definite findings of locally recurrent tumor in the right lung. 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 Continued chest CT surveillance is advised in 3-6 months. - cytology from pleural effusion in 4/19 was negative  - Needs follow up with Dr Julien Nordmann.  - No change to current plan.    6. Aortic insufficiency: Moderate-severe on postop echo. Likely potentiating CHF. - Echo 04/20/18 LVEF  20%, Mild/Mod Aortic regurg.  - No change to current plan.    7. Severe protein-calorie malnutrition  - Nutrition consulted. No change.   Length of Stay: 3  Annamaria Helling  05/08/2018, 7:52 AM  Advanced Heart Failure Team Pager [phone] (M-F; 7a - 4p)  Please contact [facility] for night-coverage after hours (4p -7a ) and weekends on [email or web address]  Patient seen and examined with the above-signed Advanced Practice Provider and/or Housestaff. I personally reviewed laboratory data, imaging studies and relevant notes. I independently examined the patient and formulated the important aspects of the plan. I have edited the note to reflect any of my changes or salient points. I have personally discussed the plan with the patient and/or family.  Overall improved. Breathing better. Volume status stable. Cytology from thoracentesis negative. Plan RHC tomorrow. Will need to watch closely for re-accumulation of R pleural effusion. May need Pleurex at some point.   Glori Bickers, MD  2:01 PM

## 2018-05-08 NOTE — Evaluation (Signed)
Physical Therapy Evaluation Patient Details Name: Brittany [name] MRN: [id number] DOB: [birth year] Today's Date: 05/08/2018   History of Present Illness  57 yo admitted 05/04/18 with recurrent respiratory failure/PNA requiring Bipap in ED. PMHx: emergent CAbG x3 2/24 with D/C to sNF then home, lung CA, HTN, PAF, CHF, asthma, anxiety  Clinical Impression  Pt was seen for mobility assessment with pt having a significant amount of independence for her transfers and gait on RW.  Already uses RW for out of the house, and can just use as needed inside as she recovers her strength.  Follow acutely for same, focusing on LE strength and balance, endurance so as to increase safety and independence back to gait inside house with no AD.      Follow Up Recommendations Home health PT    Equipment Recommendations  None recommended by PT    Recommendations for Other Services       Precautions / Restrictions Precautions Precautions: Fall Restrictions Weight Bearing Restrictions: No      Mobility  Bed Mobility Overal bed mobility: Modified Independent             General bed mobility comments: moves quickly due to frustration with having to work with therapy  Transfers Overall transfer level: Independent Equipment used: None             General transfer comment: has a walker handy from PT for transition to walk  Ambulation/Gait Ambulation/Gait assistance: Min guard Ambulation Distance (Feet): 120 Feet Assistive device: Rolling walker (2 wheeled) Gait Pattern/deviations: Step-through pattern;Decreased stride length;Trunk flexed;Wide base of support Gait velocity: reduced Gait velocity interpretation: <1.31 ft/sec, indicative of household ambulator General Gait Details: O2 sat down to 93% with effort then recovered to 96%  Stairs            Wheelchair Mobility    Modified Rankin (Stroke Patients Only)       Balance Overall balance assessment: Needs  assistance Sitting-balance support: Feet supported Sitting balance-Leahy Scale: Normal       Standing balance-Leahy Scale: Good Standing balance comment: fair dynamically                             Pertinent Vitals/Pain Pain Assessment: No/denies pain Faces Pain Scale: No hurt    Home Living Family/patient expects to be discharged to:: Private residence Living Arrangements: Other relatives(sister) Available Help at Discharge: Family;Available PRN/intermittently Type of Home: Other(Comment) Home Access: Other (comment)(townhouse)   Entrance Stairs-Number of Steps: 1 Home Layout: Two level;1/2 bath on main level Home Equipment: Walker - 2 wheels Additional Comments: lives with sister, sleeps sitting up on sofa down stairs , has been sponge bathing    Prior Function Level of Independence: Independent         Comments: used walker outside of home only     Hand Dominance   Dominant Hand: Right    Extremity/Trunk Assessment   Upper Extremity Assessment Upper Extremity Assessment: Overall WFL for tasks assessed    Lower Extremity Assessment Lower Extremity Assessment: Overall WFL for tasks assessed(pt exhibits poor effort with mm testing)    Cervical / Trunk Assessment Cervical / Trunk Assessment: Normal  Communication   Communication: No difficulties  Cognition Arousal/Alertness: Awake/alert Behavior During Therapy: WFL for tasks assessed/performed Overall Cognitive Status: Within Functional Limits for tasks assessed  General Comments: pt was a little agitated by end of session but then fine      General Comments      Exercises     Assessment/Plan    PT Assessment Patient needs continued PT services  PT Problem List Decreased strength;Decreased range of motion;Decreased activity tolerance;Decreased mobility;Decreased knowledge of use of DME       PT Treatment Interventions DME  instruction;Gait training;Stair training;Functional mobility training;Therapeutic activities;Therapeutic exercise;Balance training;Neuromuscular re-education;Patient/family education    PT Goals (Current goals can be found in the Care Plan section)  Acute Rehab PT Goals Patient Stated Goal: to get stronger PT Goal Formulation: With patient Time For Goal Achievement: 05/15/18 Potential to Achieve Goals: Good    Frequency Min 3X/week   Barriers to discharge Decreased caregiver support sister works at times    Co-evaluation               AM-PAC PT "6 Clicks" Daily Activity  Outcome Measure Difficulty turning over in bed (including adjusting bedclothes, sheets and blankets)?: None Difficulty moving from lying on back to sitting on the side of the bed? : None Difficulty sitting down on and standing up from a chair with arms (e.g., wheelchair, bedside commode, etc,.)?: A Little Help needed moving to and from a bed to chair (including a wheelchair)?: A Little Help needed walking in hospital room?: A Little Help needed climbing 3-5 steps with a railing? : A Little 6 Click Score: 20    End of Session Equipment Utilized During Treatment: Gait belt Activity Tolerance: Patient tolerated treatment well Patient left: with call bell/phone within reach Nurse Communication: Mobility status PT Visit Diagnosis: Muscle weakness (generalized) (M62.81);Difficulty in walking, not elsewhere classified (R26.2)    Time: 6314-9702 PT Time Calculation (min) (ACUTE ONLY): 28 min   Charges:   PT Evaluation $PT Eval Moderate Complexity: 1 Mod PT Treatments $Gait Training: 8-22 mins   PT G Codes:   PT G-Codes **NOT FOR INPATIENT CLASS** Functional Assessment Tool Used: AM-PAC 6 Clicks Basic Mobility    Ramond Dial 05/08/2018, 12:44 PM   Mee Hives, PT MS Acute Rehab Dept. Number: [place] [facility] [place]

## 2018-05-09 ENCOUNTER — Encounter (HOSPITAL_COMMUNITY)
Admission: EM | Disposition: A | Discharge status: Home / Self Care | Payer: Self-pay | Source: Home / Self Care | Attending: Internal Medicine

## 2018-05-09 HISTORY — PX: RIGHT HEART CATH: CATH118263

## 2018-05-09 LAB — POCT I-STAT 3, VENOUS BLOOD GAS (G3P V)
ACID-BASE EXCESS: 3 mmol/L — AB (ref 0.0–2.0)
Acid-Base Excess: 4 mmol/L — ABNORMAL HIGH (ref 0.0–2.0)
BICARBONATE: 28.9 mmol/L — AB (ref 20.0–28.0)
Bicarbonate: 30.2 mmol/L — ABNORMAL HIGH (ref 20.0–28.0)
O2 SAT: 62 %
O2 Saturation: 64 %
PCO2 VEN: 51 mmHg (ref 44.0–60.0)
PH VEN: 7.388 (ref 7.250–7.430)
PO2 VEN: 33 mmHg (ref 32.0–45.0)
PO2 VEN: 34 mmHg (ref 32.0–45.0)
TCO2: 30 mmol/L (ref 22–32)
TCO2: 32 mmol/L (ref 22–32)
pCO2, Ven: 48 mmHg (ref 44.0–60.0)
pH, Ven: 7.381 (ref 7.250–7.430)

## 2018-05-09 LAB — BASIC METABOLIC PANEL
Anion gap: 6 (ref 5–15)
BUN: 26 mg/dL — AB (ref 6–20)
CO2: 31 mmol/L (ref 22–32)
CREATININE: 0.97 mg/dL (ref 0.44–1.00)
Calcium: 9.6 mg/dL (ref 8.9–10.3)
Chloride: 100 mmol/L — ABNORMAL LOW (ref 101–111)
Glucose, Bld: 139 mg/dL — ABNORMAL HIGH (ref 65–99)
POTASSIUM: 4.4 mmol/L (ref 3.5–5.1)
SODIUM: 137 mmol/L (ref 135–145)

## 2018-05-09 LAB — CBC WITH DIFFERENTIAL/PLATELET
Abs Immature Granulocytes: 0 10*3/uL (ref 0.0–0.1)
BASOS ABS: 0.1 10*3/uL (ref 0.0–0.1)
BASOS PCT: 1 %
EOS ABS: 0.9 10*3/uL — AB (ref 0.0–0.7)
Eosinophils Relative: 13 %
HCT: 33.6 % — ABNORMAL LOW (ref 36.0–46.0)
Hemoglobin: 10.2 g/dL — ABNORMAL LOW (ref 12.0–15.0)
Immature Granulocytes: 0 %
Lymphocytes Relative: 14 %
Lymphs Abs: 1 10*3/uL (ref 0.7–4.0)
MCH: 27 pg (ref 26.0–34.0)
MCHC: 30.4 g/dL (ref 30.0–36.0)
MCV: 88.9 fL (ref 78.0–100.0)
MONO ABS: 0.7 10*3/uL (ref 0.1–1.0)
MONOS PCT: 10 %
Neutro Abs: 4.6 10*3/uL (ref 1.7–7.7)
Neutrophils Relative %: 62 %
PLATELETS: 284 10*3/uL (ref 150–400)
RBC: 3.78 MIL/uL — ABNORMAL LOW (ref 3.87–5.11)
RDW: 17.8 % — AB (ref 11.5–15.5)
WBC: 7.3 10*3/uL (ref 4.0–10.5)

## 2018-05-09 LAB — CULTURE, BLOOD (ROUTINE X 2)
CULTURE: NO GROWTH
Culture: NO GROWTH
SPECIAL REQUESTS: ADEQUATE
SPECIAL REQUESTS: ADEQUATE

## 2018-05-09 LAB — PROTIME-INR
INR: 1.09
PROTHROMBIN TIME: 14 s (ref 11.4–15.2)

## 2018-05-09 LAB — PROCALCITONIN: PROCALCITONIN: 0.29 ng/mL

## 2018-05-09 SURGERY — RIGHT HEART CATH
Anesthesia: LOCAL

## 2018-05-09 MED ORDER — SODIUM CHLORIDE 0.9% FLUSH: 3.0000 mL | INTRAVENOUS | Status: DC | PRN

## 2018-05-09 MED ORDER — FENTANYL CITRATE (PF) 100 MCG/2ML IJ SOLN
INTRAMUSCULAR | Status: DC | PRN
  Administered 2018-05-09 (×2): 25 ug via INTRAVENOUS

## 2018-05-09 MED ORDER — HEPARIN (PORCINE) IN NACL 1000-0.9 UT/500ML-% IV SOLN
INTRAVENOUS | Status: AC
  Filled 2018-05-09: qty 500

## 2018-05-09 MED ORDER — MIDAZOLAM HCL 2 MG/2ML IJ SOLN
INTRAMUSCULAR | Status: AC
  Filled 2018-05-09: qty 2

## 2018-05-09 MED ORDER — SODIUM CHLORIDE 0.9% FLUSH
3.0000 mL | Freq: Two times a day (BID) | INTRAVENOUS | Status: DC
  Administered 2018-05-09: 3 mL via INTRAVENOUS

## 2018-05-09 MED ORDER — ENOXAPARIN SODIUM 30 MG/0.3ML ~~LOC~~ SOLN: 30.0000 mg | SUBCUTANEOUS | Status: DC

## 2018-05-09 MED ORDER — SODIUM CHLORIDE 0.9 % IV SOLN: 250.0000 mL | INTRAVENOUS | Status: DC | PRN

## 2018-05-09 MED ORDER — LIDOCAINE HCL (PF) 1 % IJ SOLN
INTRAMUSCULAR | Status: DC | PRN
  Administered 2018-05-09: 1 mL

## 2018-05-09 MED ORDER — MIDAZOLAM HCL 2 MG/2ML IJ SOLN
INTRAMUSCULAR | Status: DC | PRN
  Administered 2018-05-09 (×2): 1 mg via INTRAVENOUS

## 2018-05-09 MED ORDER — LIDOCAINE HCL (PF) 1 % IJ SOLN
INTRAMUSCULAR | Status: AC
  Filled 2018-05-09: qty 30

## 2018-05-09 MED ORDER — HEPARIN (PORCINE) IN NACL 2-0.9 UNITS/ML
INTRAMUSCULAR | Status: AC | PRN
  Administered 2018-05-09: 500 mL

## 2018-05-09 MED ORDER — FENTANYL CITRATE (PF) 100 MCG/2ML IJ SOLN
INTRAMUSCULAR | Status: AC
  Filled 2018-05-09: qty 2

## 2018-05-09 MED ORDER — CEFDINIR 300 MG PO CAPS
300.0000 mg | ORAL_CAPSULE | Freq: Two times a day (BID) | ORAL | Status: DC
  Administered 2018-05-09 – 2018-05-10 (×2): 300 mg via ORAL
  Filled 2018-05-09 (×2): qty 1

## 2018-05-09 SURGICAL SUPPLY — 6 items
CATH BALLN WEDGE 5F 110CM (CATHETERS) ×1 IMPLANT
PACK CARDIAC CATHETERIZATION (CUSTOM PROCEDURE TRAY) ×2 IMPLANT
SHEATH RAIN 4/5FR (SHEATH) ×1 IMPLANT
TRANSDUCER W/STOPCOCK (MISCELLANEOUS) ×2 IMPLANT
TUBING ART PRESS 72  MALE/FEM (TUBING) ×1
TUBING ART PRESS 72 MALE/FEM (TUBING) IMPLANT

## 2018-05-09 NOTE — Progress Notes (Addendum)
Advanced Heart Failure Rounding Note  PCP-Cardiologist: No primary care provider on file.   Subjective:     Still weak but feeling better. Able to walk 120 feet with PT. Denies SOB, orthopnea or PND. Cough improved.     Objective:   Weight Range: 101 lb 9.6 oz (46.1 kg) Body mass index is 20.18 kg/m.   Vital Signs:   Temp:  [98.1 F (36.7 C)-99.5 F (37.5 C)] 98.1 F (36.7 C) (05/30 0541) Pulse Rate:  [74-76] 75 (05/30 0541) Resp:  [16-18] 16 (05/30 0541) BP: (88-99)/(52-62) 99/62 (05/30 0541) SpO2:  [97 %-100 %] 100 % (05/30 0541) Weight:  [101 lb 9.6 oz (46.1 kg)] 101 lb 9.6 oz (46.1 kg) (05/30 0541) Last BM Date: 05/08/18  Weight change: Filed Weights   05/07/18 0615 05/08/18 0514 05/09/18 0541  Weight: 99 lb 3.2 oz (45 kg) 99 lb 12.8 oz (45.3 kg) 101 lb 9.6 oz (46.1 kg)   Intake/Output:   Intake/Output Summary (Last 24 hours) at 05/09/2018 0845 Last data filed at 05/09/2018 0600 Gross per 24 hour  Intake 1221.5 ml  Output 500 ml  Net 721.5 ml    Physical Exam    General:  Thin. No resp difficulty HEENT: normal anicteric Neck: supple. no JVD. Carotids 2+ bilat; no bruits. No lymphadenopathy or thryomegaly appreciated. Cor: PMI nondisplaced. Regular rate & rhythm. No rubs, gallops or murmurs. Lungs: clear no wheeze Abdomen: soft, nontender, nondistended. No hepatosplenomegaly. No bruits or masses. Good bowel sounds. Extremities: no cyanosis, clubbing, rash, edema Neuro: alert & oriented x 3, cranial nerves grossly intact. moves all 4 extremities w/o difficulty. Affect pleasant    Telemetry  NSR 80s personally reviewed.    Labs    CBC Recent Labs    05/09/18 0405  WBC 7.3  NEUTROABS 4.6  HGB 10.2*  HCT 33.6*  MCV 88.9  PLT 782   Basic Metabolic Panel Recent Labs    05/08/18 0619 05/09/18 0405  NA 138 137  K 4.4 4.4  CL 98* 100*  CO2 31 31  GLUCOSE 120* 139*  BUN 31* 26*  CREATININE 1.04* 0.97  CALCIUM 9.7 9.6   Liver Function  Tests No results for input(s): AST, ALT, ALKPHOS, BILITOT, PROT, ALBUMIN in the last 72 hours. No results for input(s): LIPASE, AMYLASE in the last 72 hours. Cardiac Enzymes Recent Labs    05/06/18 1434 05/06/18 2037 05/07/18 0242  TROPONINI 0.03* 0.04* 0.04*    BNP: BNP (last 3 results) Recent Labs    02/15/18 0449 04/19/18 1218 05/05/18 0142  BNP 2,526.7* 1,663.5* 2,601.1*    ProBNP (last 3 results) No results for input(s): PROBNP in the last 8760 hours.   D-Dimer No results for input(s): DDIMER in the last 72 hours. Hemoglobin A1C No results for input(s): HGBA1C in the last 72 hours. Fasting Lipid Panel No results for input(s): CHOL, HDL, LDLCALC, TRIG, CHOLHDL, LDLDIRECT in the last 72 hours. Thyroid Function Tests No results for input(s): TSH, T4TOTAL, T3FREE, THYROIDAB in the last 72 hours.  Invalid input(s): FREET3  Other results:   Imaging    No results found.   Medications:     Scheduled Medications: . aspirin EC  81 mg Oral Daily  . atorvastatin  80 mg Oral q1800  . clopidogrel  75 mg Oral Daily  . digoxin  0.0625 mg Oral Daily  . enoxaparin (LOVENOX) injection  40 mg Subcutaneous Q24H  . feeding supplement (ENSURE ENLIVE)  237 mL Oral TID WC  .  furosemide  20 mg Oral Daily  . ipratropium-albuterol  3 mL Nebulization BID  . ivabradine  7.5 mg Oral BID WC  . sodium chloride flush  3 mL Intravenous Q12H  . sodium chloride flush  3 mL Intravenous Q12H  . spironolactone  12.5 mg Oral QHS    Infusions: . sodium chloride    . sodium chloride    . sodium chloride 10 mL/hr at 05/08/18 2351  . ceFEPime (MAXIPIME) IV Stopped (05/09/18 0018)    PRN Medications: sodium chloride, sodium chloride, acetaminophen, albuterol, docusate sodium, guaiFENesin-dextromethorphan, nitroGLYCERIN, ondansetron (ZOFRAN) IV, oxyCODONE, sodium chloride flush, sodium chloride flush    Patient Profile   Brittany [name] a 57 y/o woman with a history of  PAF,preveious smoker quit 4 years ago, hypertension, 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   Recent admit with NSTEMI and shock. Underwent emergent cath 02/03/18 showed LAD 100% stenosed, LCx 95% stenosed. Taken for emergent CABG 02/03/18. Required impella post op. Hospital course complicated by cardiogenic shock, HCAP, A fib, respiratory failure, and swallowing issues.  Admit from HF clinic with acute dyspnea.   Assessment/Plan   1. Acute on chronic hypoxic respiratory failure - likely multifactorial due to HF, PNA and recurrent R pleural effusion - PCT 2.07 BNP 2,600 (up from 1,600) - Now improved with diuresis, abx and repeat right thoracentesis - Continue abx - Watch for recurrent pleural effusion. Suspect she may need Pleurex in the future.  - Co-ox on last admit was 65% arguing against shock. RHC today.    2. Acute on chronic Systolic HF  - has iCM related to large NSTEMI with LAD occlusion requiring urgent CABG 2/19 and Impella supprot -TEE 02/03/18 LVEF 20%, Mild to Mod AR, Mild/Mod MR, RV normal.   - Echo 4/19 EF 25 with moderate aortic insufficiency  - BNP up from baseline on admit.  - Volume status stble.  - Continue lasix po at 20 mg daily.  - Continue digoxin 0.125 mg daily - Continue ivabridine 7.5 mg BID - Continue spiro 12.5 mg daily.  - BP had been too low for ACE/ARB/ARNI or b-blocker - RHC today.    3. CAD - s/p NSTEMI/STEMIin 2/19 Cath with severe 2v CAD and severe LV dysfunction/ICM. s/p Emergent CABG 02/03/18 requiring Impella support - No s/s of ischemia.    -Continue plavix, asa, atorvastatin.  4.PAF:  CHA2DS2/VASc is at least 4. (CHF, Vasc disease, HTN, Female).  - She has history of Afib RVR in the past in the setting of her Lung CA. Previously on Xarelto but stopped her medications.    -Maintaining NSR.  - Will continue ASA and Plavix. No  anticoagulation with h/o lung caner. - No change to current plan.    5. 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  - Chest CT 02/19/18 with mass-like consolidation in R hilum concerning for recurrent tumor. -Repeat CT 03/27/2018 -No definite findings of locally recurrent tumor in the right lung. 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 Continued chest CT surveillance is advised in 3-6 months. - cytology from pleural effusion in 4/19 was negative  - Needs follow up with Dr Julien Nordmann.  - No change to current plan.    6. Aortic insufficiency: Moderate-severe on postop echo. Likely potentiating CHF. - Echo 04/20/18 LVEF 20%, Mild/Mod Aortic regurg.  - No change to current plan.  7. Severe protein-calorie malnutrition  - Nutrition consulted. No change.   She is refusing HH. HF Paramedicine can follow at discharge if she is agreeable.    Plan for RHC today.  Length of Stay: 4  Amy Clegg, NP  05/09/2018, 8:45 AM  Advanced Heart Failure Team Pager [phone] (M-F; [place])  Please contact [facility] for night-coverage after hours (4p -7a ) and weekends on [email or web address]  Patient seen and examined with Darrick Grinder, NP. We discussed all aspects of the encounter. I agree with the assessment and plan as stated above.   Getting stronger but still weak. Respiratory status improved. Volume status looks ok. For [place] later today. Cytology from pleural fluid was negative.   Glori Bickers MD

## 2018-05-09 NOTE — Progress Notes (Signed)
SLP Cancellation Note  Patient Details Name: Brittany [name] MRN: [id number] DOB: 1961/01/14   Cancelled treatment:         NPO for heart cath.  [name] 05/09/2018, 2:25 PM

## 2018-05-09 NOTE — Progress Notes (Signed)
[facility] PROGRESS NOTE  Brittany [name] VHQ:[id number] DOB: 18-Apr-1961 DOA: 05/04/2018  PCP: System, Pcp Not In  Brief History/Interval Summary: 57 year old female with chronic systolic CHF with EF 41%, CAD status post recent CABG paroxysmal A. fib not on anticoagulation, chronic pain presented with progressive shortness of breath.  Patient was recently discharged on 5/16 after management of acute on chronic CHF.  Patient was placed on BiPAP in ED.  Chest x-ray showed moderate right pleural effusion, consolidation in right middle and lower lobes, mild interstitial edema.  Patient was placed on vancomycin and Zosyn, cardiology was consulted, admitted for further work-up.  Patient underwent thoracentesis.  Reason for Visit: Acute systolic CHF  Consultants: Heart failure team  Procedures: Thoracentesis  Antibiotics: Vancomycin and cefepime 5/25  Subjective/Interval History: Patient states that she feels well.  Denies any shortness of breath or chest pain.  ROS: Denies any nausea or vomiting  Objective:  Vital Signs  Vitals:   05/08/18 1424 05/08/18 2042 05/08/18 2123 05/09/18 0541  BP: (!) 92/56 (!) 88/52  99/62  Pulse: 76 75 74 75  Resp:  16 18 16   Temp: 98.4 F (36.9 C) 99.5 F (37.5 C)  98.1 F (36.7 C)  TempSrc: Oral Oral  Oral  SpO2: 97% 98% 98% 100%  Weight:    46.1 kg (101 lb 9.6 oz)  Height:        Intake/Output Summary (Last 24 hours) at 05/09/2018 1254 Last data filed at 05/09/2018 0918 Gross per 24 hour  Intake 1101.5 ml  Output 500 ml  Net 601.5 ml   Filed Weights   05/07/18 0615 05/08/18 0514 05/09/18 0541  Weight: 45 kg (99 lb 3.2 oz) 45.3 kg (99 lb 12.8 oz) 46.1 kg (101 lb 9.6 oz)    General appearance: Awake alert.  In no distress Resp: Normal effort.  Improved air entry bilaterally.  No wheezing or rhonchi.  Few crackles at the bases. Cardio: S1-S2 is normal regular.  Murmurs or bruit GI: Abdomen is soft.  Nontender  nondistended.  Bowel sounds are present.  No masses organomegaly Extremities: No edema Neurologic: No focal neurological deficits.  Lab Results:  Data Reviewed: I have personally reviewed following labs and imaging studies  CBC: Recent Labs  Lab 05/04/18 2016 05/04/18 2029 05/05/18 0142 05/09/18 0405  WBC 14.1*  --  14.5* 7.3  NEUTROABS 11.1*  --  13.1* 4.6  HGB 11.4* 13.3 10.2* 10.2*  HCT 38.3 39.0 33.2* 33.6*  MCV 91.6  --  88.3 88.9  PLT 410*  --  289 324    Basic Metabolic Panel: Recent Labs  Lab 05/04/18 2029 05/06/18 0325 05/07/18 0242 05/08/18 0619 05/09/18 0405  NA 138 138 137 138 137  K 4.7 3.9 4.3 4.4 4.4  CL 105 101 99* 98* 100*  CO2  --  26 27 31 31   GLUCOSE 169* 213* 125* 120* 139*  BUN 19 26* 33* 31* 26*  CREATININE 0.90 1.06* 1.29* 1.04* 0.97  CALCIUM  --  9.2 9.3 9.7 9.6    GFR: Estimated Creatinine Clearance: 44.9 mL/min (by C-G formula based on SCr of 0.97 mg/dL).  Liver Function Tests: Recent Labs  Lab 05/05/18 0142  AST 35  ALT 45  ALKPHOS 80  BILITOT 0.9  PROT 6.7  ALBUMIN 3.4*    Cardiac Enzymes: Recent Labs  Lab 05/06/18 1054 05/06/18 1434 05/06/18 2037 05/07/18 0242  TROPONINI 0.04* 0.03* 0.04* 0.04*     Recent Results (from the past 240 hour(s))  Blood culture (routine x 2)     Status: None (Preliminary result)   Collection Time: 05/04/18  9:48 PM  Result Value Ref Range Status   Specimen Description BLOOD RIGHT FOREARM  Final   Special Requests   Final    BOTTLES DRAWN AEROBIC ONLY Blood Culture adequate volume   Culture   Final    NO GROWTH 4 DAYS Performed at [facility], [address], [place], [place] [address]    Report Status PENDING  Incomplete  Blood culture (routine x 2)     Status: None (Preliminary result)   Collection Time: 05/04/18  9:50 PM  Result Value Ref Range Status   Specimen Description BLOOD LEFT ARM  Final   Special Requests   Final    BOTTLES DRAWN AEROBIC AND ANAEROBIC Blood  Culture adequate volume   Culture   Final    NO GROWTH 4 DAYS Performed at [facility], [address], [place],  [address]    Report Status PENDING  Incomplete  Body fluid culture     Status: None   Collection Time: 05/05/18 11:06 AM  Result Value Ref Range Status   Specimen Description PLEURAL  Final   Special Requests NONE  Final   Gram Stain   Final    RARE WBC PRESENT, PREDOMINANTLY PMN NO ORGANISMS SEEN    Culture   Final    NO GROWTH 3 DAYS Performed at [facility], [place] [address], [place],  [address]    Report Status 05/08/2018 FINAL  Final      Radiology Studies: No results found.   Medications:  Scheduled: . aspirin EC  81 mg Oral Daily  . atorvastatin  80 mg Oral q1800  . clopidogrel  75 mg Oral Daily  . digoxin  0.0625 mg Oral Daily  . enoxaparin (LOVENOX) injection  40 mg Subcutaneous Q24H  . feeding supplement (ENSURE ENLIVE)  237 mL Oral TID WC  . furosemide  20 mg Oral Daily  . ipratropium-albuterol  3 mL Nebulization BID  . ivabradine  7.5 mg Oral BID WC  . sodium chloride flush  3 mL Intravenous Q12H  . sodium chloride flush  3 mL Intravenous Q12H  . spironolactone  12.5 mg Oral QHS   Continuous: . sodium chloride    . sodium chloride    . sodium chloride 10 mL/hr at 05/08/18 2351  . ceFEPime (MAXIPIME) IV Stopped (05/09/18 0018)   YKD:XIPJAS chloride, sodium chloride, acetaminophen, albuterol, docusate sodium, guaiFENesin-dextromethorphan, nitroGLYCERIN, ondansetron (ZOFRAN) IV, oxyCODONE, sodium chloride flush, sodium chloride flush  Assessment/Plan:    Acute on chronic respiratory failure with hypoxia Secondary to acute systolic CHF.  Patient has improved.  Was initially on BiPAP.  Continue oxygen as tolerated.  Maintain sats greater than 90%.  Patient uses oxygen at home.  Acute systolic CHF/right-sided pleural effusion Patient underwent thoracentesis with removal of 900 mL of fluid.  Cultures have been  negative.  Patient was placed on IV diuretics.  Heart failure team was consulted.  Blood pressure remains low.  Patient is noted to be on digoxin and ivabradine and spironolactone.  No ACEI/BB/ARB due to soft blood pressure.  Recent echocardiogram showed EF to be 20%.  Cardiology plans right heart catheterization on 5/30.  She was started back on low-dose Lasix.  Blood pressure seems to be tolerating it okay.  Further management decisions after she undergoes right heart catheterization.  History of coronary artery disease Patient was found  to have severe two-vessel disease in February and underwent emergent CABG. Continue aspirin Plavix statin.  Stable.  Healthcare associated pneumonia Chest x-ray showed right-sided pleural effusion with associated consolidation in the right middle lobe and right lower lung.  Procalcitonin was 2.0.  Patient had leukocytosis.  Started on IV vancomycin and cefepime.  Cultures have been negative.  Vancomycin was discontinued.  ProCalcitonin improved to 0.29.Marland Kitchen  She is afebrile.  Cough is improved.  Change cefepime to Cefdinir to complete 7 days of treatment..    Recurrent right-sided pleural effusion Status post thoracentesis.  Will need continued monitoring outpatient.  May need Pleurx if she continues to have recurrent accumulation.  Hypotension Blood pressure remains low.  Patient is asymptomatic.  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  She is status post radiation and chemotherapy in 2015.  Recent thoracenteses was with negative cytology.  Outpatient surveillance CT scan in 6 months.  Paroxysmal atrial fibrillation Prior history of rapid A. fib.  Patient was previously on Xarelto but she stopped her medications.  Continue aspirin Plavix for now.  No anticoagulation per cardiology.  Severe protein calorie malnutrition Nutrition is following.  Mild acute renal failure Creatinine went up to 1.29.  Pain is down to normal.  Normocytic anemia Likely due to  chronic disease.  Hemoglobin is stable.  No evidence for overt bleeding.  DVT Prophylaxis: Lovenox    Code Status: Full code Family Communication: Discussed with the patient Disposition Plan: Management as outlined above.  Discussed with heart failure team.  Right heart catheterization today.  Anticipate discharge tomorrow.    LOS: 4 days   [facility] Pager [phone] 05/09/2018, 12:54 PM  If 7PM-7AM, please contact night-coverage at [email or web address], password [facility]

## 2018-05-09 NOTE — Progress Notes (Signed)
Patient known through enrollment in [facility].  I attempted to see patient and provide education as well as discuss her plan after discharge.  She did not have time to speak and said she was on an important phone call.  I will plan to attempt to see her again later.

## 2018-05-09 NOTE — Progress Notes (Signed)
Pt refused CPAP at this time. Stated that she was doing better and doesn't wear one at home so she wanted to try to go without it tonight. Pt is currently on 2L nasal cannula and CPAP is at bedside is pt may need it. Educated pt to call if she felt SOB.

## 2018-05-09 NOTE — Progress Notes (Signed)
Pt refused to wear CPAP tonight states that she doesn't need it and doesn't wear one at home. Pt pending possible discharge tom and resting comfortably on room air and uses 2L nasal cannula at night while sleeping. Cont to monitor as needed.

## 2018-05-09 NOTE — Interval H&P Note (Signed)
History and Physical Interval Note:  05/09/2018 11:55 AM  Brittany [name]  has presented today for surgery, with the diagnosis of hp  The various methods of treatment have been discussed with the patient and family. After consideration of risks, benefits and other options for treatment, the patient has consented to  Procedure(s): RIGHT HEART CATH (N/A) as a surgical intervention .  The patient's history has been reviewed, patient examined, no change in status, stable for surgery.  I have reviewed the patient's chart and labs.  Questions were answered to the patient's satisfaction.     Darren Nodal

## 2018-05-10 ENCOUNTER — Encounter (HOSPITAL_COMMUNITY): Payer: Self-pay | Admitting: Internal Medicine

## 2018-05-10 ENCOUNTER — Inpatient Hospital Stay (HOSPITAL_COMMUNITY): Payer: BLUE CROSS/BLUE SHIELD

## 2018-05-10 ENCOUNTER — Other Ambulatory Visit (HOSPITAL_COMMUNITY): Payer: Self-pay

## 2018-05-10 LAB — BASIC METABOLIC PANEL
ANION GAP: 5 (ref 5–15)
BUN: 27 mg/dL — ABNORMAL HIGH (ref 6–20)
CO2: 28 mmol/L (ref 22–32)
Calcium: 9.2 mg/dL (ref 8.9–10.3)
Chloride: 105 mmol/L (ref 101–111)
Creatinine, Ser: 1.01 mg/dL — ABNORMAL HIGH (ref 0.44–1.00)
Glucose, Bld: 134 mg/dL — ABNORMAL HIGH (ref 65–99)
POTASSIUM: 4.5 mmol/L (ref 3.5–5.1)
SODIUM: 138 mmol/L (ref 135–145)

## 2018-05-10 LAB — PROCALCITONIN: PROCALCITONIN: 0.17 ng/mL

## 2018-05-10 MED ORDER — IVABRADINE HCL 7.5 MG PO TABS: 7.5000 mg | ORAL_TABLET | Freq: Two times a day (BID) | ORAL | 1 refills | Status: DC

## 2018-05-10 MED ORDER — IPRATROPIUM-ALBUTEROL 0.5-2.5 (3) MG/3ML IN SOLN: 3.0000 mL | Freq: Two times a day (BID) | RESPIRATORY_TRACT | 0 refills | Status: DC

## 2018-05-10 MED ORDER — ATORVASTATIN CALCIUM 80 MG PO TABS: 80.0000 mg | ORAL_TABLET | Freq: Every day | ORAL | 1 refills | Status: DC

## 2018-05-10 MED ORDER — CLOPIDOGREL BISULFATE 75 MG PO TABS: 75.0000 mg | ORAL_TABLET | Freq: Every day | ORAL | 1 refills | Status: DC

## 2018-05-10 MED ORDER — DIGOXIN 62.5 MCG PO TABS: 0.0625 mg | ORAL_TABLET | Freq: Every day | ORAL | 1 refills | Status: DC

## 2018-05-10 MED ORDER — CEFDINIR 300 MG PO CAPS: 300.0000 mg | ORAL_CAPSULE | Freq: Two times a day (BID) | ORAL | 0 refills | Status: AC

## 2018-05-10 MED ORDER — SPIRONOLACTONE 25 MG PO TABS: 12.5000 mg | ORAL_TABLET | Freq: Every day | ORAL | 1 refills | Status: DC

## 2018-05-10 MED ORDER — NITROGLYCERIN 0.4 MG SL SUBL: 0.4000 mg | SUBLINGUAL_TABLET | SUBLINGUAL | 0 refills | Status: DC | PRN

## 2018-05-10 MED ORDER — ASPIRIN 81 MG PO TBEC: 81.0000 mg | DELAYED_RELEASE_TABLET | Freq: Every day | ORAL | 1 refills | Status: DC

## 2018-05-10 MED ORDER — FUROSEMIDE 20 MG PO TABS: 20.0000 mg | ORAL_TABLET | Freq: Every day | ORAL | 1 refills | Status: DC

## 2018-05-10 NOTE — Discharge Instructions (Signed)
Heart Failure °Heart failure means your heart has trouble pumping blood. This makes it hard for your body to work well. Heart failure is usually a long-term (chronic) condition. You must take good care of yourself and follow your doctor's treatment plan. °Follow these instructions at home: °· Take your heart medicine as told by your doctor. °? Do not stop taking medicine unless your doctor tells you to. °? Do not skip any dose of medicine. °? Refill your medicines before they run out. °? Take other medicines only as told by your doctor or pharmacist. °· Stay active if told by your doctor. The elderly and people with severe heart failure should talk with a doctor about physical activity. °· Eat heart-healthy foods. Choose foods that are without trans fat and are low in saturated fat, cholesterol, and salt (sodium). This includes fresh or frozen fruits and vegetables, fish, lean meats, fat-free or low-fat dairy foods, whole grains, and high-fiber foods. Lentils and dried peas and beans (legumes) are also good choices. °· Limit salt if told by your doctor. °· Cook in a healthy way. Roast, grill, broil, bake, poach, steam, or stir-fry foods. °· Limit fluids as told by your doctor. °· Weigh yourself every morning. Do this after you pee (urinate) and before you eat breakfast. Write down your weight to give to your doctor. °· Take your blood pressure and write it down if your doctor tells you to. °· Ask your doctor how to check your pulse. Check your pulse as told. °· Lose weight if told by your doctor. °· Stop smoking or chewing tobacco. Do not use gum or patches that help you quit without your doctor's approval. °· Schedule and go to doctor visits as told. °· Nonpregnant women should have no more than 1 drink a day. Men should have no more than 2 drinks a day. Talk to your doctor about drinking alcohol. °· Stop illegal drug use. °· Stay current with shots (immunizations). °· Manage your health conditions as told by your  doctor. °· Learn to manage your stress. °· Rest when you are tired. °· If it is really hot outside: °? Avoid intense activities. °? Use air conditioning or fans, or get in a cooler place. °? Avoid caffeine and alcohol. °? Wear loose-fitting, lightweight, and light-colored clothing. °· If it is really cold outside: °? Avoid intense activities. °? Layer your clothing. °? Wear mittens or gloves, a hat, and a scarf when going outside. °? Avoid alcohol. °· Learn about heart failure and get support as needed. °· Get help to maintain or improve your quality of life and your ability to care for yourself as needed. °Contact a doctor if: °· You gain weight quickly. °· You are more short of breath than usual. °· You cannot do your normal activities. °· You tire easily. °· You cough more than normal, especially with activity. °· You have any or more puffiness (swelling) in areas such as your hands, feet, ankles, or belly (abdomen). °· You cannot sleep because it is hard to breathe. °· You feel like your heart is beating fast (palpitations). °· You get dizzy or light-headed when you stand up. °Get help right away if: °· You have trouble breathing. °· There is a change in mental status, such as becoming less alert or not being able to focus. °· You have chest pain or discomfort. °· You faint. °This information is not intended to replace advice given to you by your health care provider. Make sure you   discuss any questions you have with your health care provider. °Document Released: 09/05/2008 Document Revised: 05/04/2016 Document Reviewed: 01/13/2013 °[employer] Interactive Patient Education © 2017 [employer] ° °

## 2018-05-10 NOTE — Discharge Summary (Signed)
[facility]  Physician Discharge Summary   Patient ID: Brittany [name] MRN: [id number] DOB/AGE: 58-Sep-1962 58 y.o.  Admit date: 05/04/2018 Discharge date: 05/10/2018  PCP: System, Pcp Not In  DISCHARGE DIAGNOSES:  Acute systolic CHF  RECOMMENDATIONS FOR OUTPATIENT FOLLOW UP: 1. Patient will follow-up at the heart failure clinic 2. She declines home health.   DISCHARGE CONDITION: fair  Diet recommendation: Heart healthy  Filed Weights   05/08/18 0514 05/09/18 0541 05/10/18 0522  Weight: 45.3 kg (99 lb 12.8 oz) 46.1 kg (101 lb 9.6 oz) 45.6 kg (100 lb 8.5 oz)    INITIAL HISTORY: 57 year old female with chronic systolic CHF with EF 90%, CAD status post recent CABG paroxysmal A. fib not on anticoagulation, chronic pain presented with progressive shortness of breath. Patient was recently discharged on 5/16 after management of acute on chronic CHF. Patient was placed on BiPAP in ED. Chest x-ray showed moderate right pleural effusion, consolidation in right middle and lower lobes, mild interstitial edema. Patient was placed on vancomycin and Zosyn, cardiology was consulted, admitted for further work-up.  Patient underwent thoracentesis.  Consultants: Heart failure team  Procedures: Thoracentesis   HOSPITAL COURSE:   Acute on chronic respiratory failure with hypoxia Secondary to acute systolic CHF.  Patient has improved.  Was initially on BiPAP.    Weaned down to oxygen.  Sats are greater than 90%.  Patient uses oxygen at home.  Acute systolic CHF/right-sided pleural effusion Patient underwent thoracentesis with removal of 900 mL of fluid.  Cultures have been negative.  Patient was placed on IV diuretics.  Heart failure team was consulted.  Patient was continued on her heart failure medications including digoxin and ivabradine and spironolactone.  No ACEI/BB/ARB due to soft blood pressure.  She was also started on low-dose Lasix which she is tolerating well.  Recent echocardiogram showed EF to be 20%.    She underwent a right heart catheterization on 5/30.  Cleared for discharge by cardiology.  She will follow-up with them in their clinic.    History of coronary artery disease Patient was found to have severe two-vessel disease in February and underwent emergent CABG. Continue aspirin Plavix statin.  Stable.  Healthcare associated pneumonia Chest x-ray showed right-sided pleural effusion with associated consolidation in the right middle lobe and right lower lung.  Procalcitonin was 2.0.  Patient had leukocytosis.  Started on IV vancomycin and cefepime.  Cultures have been negative.  Vancomycin was discontinued.  ProCalcitonin improved to 0.29.  She is afebrile.  Cough is improved.  Changed cefepime to Cefdinir to complete 7 days of treatment..    Recurrent right-sided pleural effusion Status post thoracentesis.  Will need continued monitoring outpatient.  May need Pleurx if she continues to have recurrent accumulation.  Cardiology to do chest x-ray in 1 week.  Hypotension Blood pressure remains low.  Patient is asymptomatic.  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  She is status post radiation and chemotherapy in 2015.  Recent thoracenteses was with negative cytology.  Outpatient surveillance CT scan in 6 months.  Paroxysmal atrial fibrillation Prior history of rapid A. fib.  Patient was previously on Xarelto but she stopped her medications.  Continue aspirin Plavix for now.  No anticoagulation per cardiology.  Severe protein calorie malnutrition Encouraged oral intake.  Mild acute renal failure Creatinine peaked at 1.29.  Now close to normal.  Normocytic anemia Likely due to chronic disease.  Hemoglobin is stable.  No evidence for overt bleeding.  Overall stable.  Okay  for discharge home today.   PERTINENT LABS:  The results of significant diagnostics from this hospitalization (including imaging, microbiology, ancillary  and laboratory) are listed below for reference.    Microbiology: Recent Results (from the past 240 hour(s))  Blood culture (routine x 2)     Status: None   Collection Time: 05/04/18  9:48 PM  Result Value Ref Range Status   Specimen Description BLOOD RIGHT FOREARM  Final   Special Requests   Final    BOTTLES DRAWN AEROBIC ONLY Blood Culture adequate volume   Culture   Final    NO GROWTH 5 DAYS Performed at [facility], [address] [place], [place] [address]    Report Status 05/09/2018 FINAL  Final  Blood culture (routine x 2)     Status: None   Collection Time: 05/04/18  9:50 PM  Result Value Ref Range Status   Specimen Description BLOOD LEFT ARM  Final   Special Requests   Final    BOTTLES DRAWN AEROBIC AND ANAEROBIC Blood Culture adequate volume   Culture   Final    NO GROWTH 5 DAYS Performed at [facility] [address], [place], [place] [address]    Report Status 05/09/2018 FINAL  Final  Body fluid culture     Status: None   Collection Time: 05/05/18 11:06 AM  Result Value Ref Range Status   Specimen Description PLEURAL  Final   Special Requests NONE  Final   Gram Stain   Final    RARE WBC PRESENT, PREDOMINANTLY PMN NO ORGANISMS SEEN    Culture   Final    NO GROWTH 3 DAYS Performed at [facility], [place] [address], [place], [place] [address]    Report Status 05/08/2018 FINAL  Final     Labs: Basic Metabolic Panel: Recent Labs  Lab 05/06/18 0325 05/07/18 0242 05/08/18 0619 05/09/18 0405 05/10/18 0346  NA 138 137 138 137 138  K 3.9 4.3 4.4 4.4 4.5  CL 101 99* 98* 100* 105  CO2 26 27 31 31 28   GLUCOSE 213* 125* 120* 139* 134*  BUN 26* 33* 31* 26* 27*  CREATININE 1.06* 1.29* 1.04* 0.97 1.01*  CALCIUM 9.2 9.3 9.7 9.6 9.2   Liver Function Tests: Recent Labs  Lab 05/05/18 0142  AST 35  ALT 45  ALKPHOS 80  BILITOT 0.9  PROT 6.7  ALBUMIN 3.4*   CBC: Recent Labs  Lab 05/04/18 2016 05/04/18 2029 05/05/18 0142 05/09/18 0405   WBC 14.1*  --  14.5* 7.3  NEUTROABS 11.1*  --  13.1* 4.6  HGB 11.4* 13.3 10.2* 10.2*  HCT 38.3 39.0 33.2* 33.6*  MCV 91.6  --  88.3 88.9  PLT 410*  --  289 284   Cardiac Enzymes: Recent Labs  Lab 05/06/18 1054 05/06/18 1434 05/06/18 2037 05/07/18 0242  TROPONINI 0.04* 0.03* 0.04* 0.04*   BNP: BNP (last 3 results) Recent Labs    02/15/18 0449 04/19/18 1218 05/05/18 0142  BNP 2,526.7* 1,663.5* 2,601.1*     IMAGING STUDIES Dg Chest 1 View  Result Date: 05/05/2018 CLINICAL DATA:  Status post thoracentesis. EXAM: CHEST  1 VIEW COMPARISON:  05/04/2018. FINDINGS: Cardiac enlargement. Status post CABG procedure. Significant decrease in volume of right pleural effusion. No pneumothorax status post thoracentesis. IMPRESSION: 1. No complications identified status post right thoracentesis. Electronically Signed   By: Kerby Moors M.D.   On: 05/05/2018 11:36   Dg Chest 2 View  Result Date:  05/10/2018 CLINICAL DATA:  Dyspnea.  History of treated small cell lung cancer. EXAM: CHEST - 2 VIEW COMPARISON:  05/05/2018.  Chest CT dated 04/20/2018. FINDINGS: Stable post CABG changes. Mildly enlarged cardiac silhouette with some improvement. Mild increase in prominence of the pulmonary vasculature and interstitial markings, including Kerley lines. Minimal pleural fluid on the right. Stable right middle lobe and perihilar opacity. The lungs remain hyperexpanded with mild diffuse peribronchial thickening. Unremarkable bones. IMPRESSION: 1. Interval mild changes of congestive heart failure superimposed on COPD. 2. Stable right middle lobe and right perihilar probable post treatment changes. Pneumonia cannot be excluded. Electronically Signed   By: Claudie Revering M.D.   On: 05/10/2018 08:57    Dg Chest Portable 1 View  Result Date: 05/04/2018 CLINICAL DATA:  Respiratory distress; chest pain EXAM: PORTABLE CHEST 1 VIEW COMPARISON:  May 12 19 FINDINGS: There is a moderate pleural effusion on the right  with consolidation in portions of the right middle and lower lobes. There is underlying mild interstitial pulmonary edema. Heart is mildly enlarged with pulmonary vascularity within normal limits. Patient is status post coronary artery bypass grafting. No. No evident lesions. IMPRESSION: Moderate pleural effusion on the right with consolidation in portions of the right middle and lower lobes. Mild interstitial edema. Cardiac prominence. Postoperative change noted. Electronically Signed   By: Lowella Grip III M.D.   On: 05/04/2018 20:34   US Thoracentesis Asp Pleural Space W/img Guide  Result Date: 05/05/2018 INDICATION: Patient with right pleural effusion. Request is made for diagnostic and therapeutic thoracentesis. EXAM: ULTRASOUND GUIDED DIAGNOSTIC AND THERAPEUTIC THORACENTESIS MEDICATIONS: 10 mL 1% lidocaine COMPLICATIONS: None immediate. PROCEDURE: An ultrasound guided thoracentesis was thoroughly discussed with the patient and questions answered. The benefits, risks, alternatives and complications were also discussed. The patient understands and wishes to proceed with the procedure. Written consent was obtained. Ultrasound was performed to localize and mark an adequate pocket of fluid in the right chest. The area was then prepped and draped in the normal sterile fashion. 1% Lidocaine was used for local anesthesia. Under ultrasound guidance a 6 Fr Safe-T-Centesis catheter was introduced. Thoracentesis was performed. The catheter was removed and a dressing applied. FINDINGS: A total of approximately 900 mL of amber fluid was removed. Samples were sent to the laboratory as requested by the clinical team. IMPRESSION: Successful ultrasound guided diagnostic and therapeutic right thoracentesis yielding 900 mL of pleural fluid. Read by: Brynda Greathouse PA-C No pneumothorax on follow-up chest radiograph. Electronically Signed   By: Lucrezia Europe M.D.   On: 05/05/2018 11:53    DISCHARGE EXAMINATION: Vitals:    05/09/18 2024 05/09/18 2129 05/10/18 0522 05/10/18 0837  BP:  (!) 88/61 (!) 94/53   Pulse: 87 77 77   Resp: 18 16 16    Temp:  99 F (37.2 C) 98.9 F (37.2 C)   TempSrc:  Oral Oral   SpO2: 96% 95% 100% 96%  Weight:   45.6 kg (100 lb 8.5 oz)   Height:       General appearance: alert, cooperative, appears stated age and no distress Resp: clear to auscultation bilaterally Cardio: regular rate and rhythm, S1, S2 normal, no murmur, click, rub or gallop GI: soft, non-tender; bowel sounds normal; no masses,  no organomegaly  DISPOSITION: Home  Discharge Instructions    (HEART FAILURE PATIENTS) Call MD:  Anytime you have any of the following symptoms: 1) 3 pound weight gain in 24 hours or 5 pounds in 1 week 2) shortness of breath, with  or without a dry hacking cough 3) swelling in the hands, feet or stomach 4) if you have to sleep on extra pillows at night in order to breathe.   Complete by:  As directed    Call MD for:  extreme fatigue   Complete by:  As directed    Call MD for:  persistant dizziness or light-headedness   Complete by:  As directed    Call MD for:  persistant nausea and vomiting   Complete by:  As directed    Call MD for:  severe uncontrolled pain   Complete by:  As directed    Call MD for:  temperature >100.4   Complete by:  As directed    Discharge instructions   Complete by:  As directed    Be sure to follow-up with the heart failure clinic as scheduled.  Take your medications as prescribed.  Call the heart failure clinic if there are any questions.  You were cared for by a hospitalist during your hospital stay. If you have any questions about your discharge medications or the care you received while you were in the hospital after you are discharged, you can call the unit and asked to speak with the hospitalist on call if the hospitalist that took care of you is not available. Once you are discharged, your primary care physician will handle any further medical issues.  Please note that NO REFILLS for any discharge medications will be authorized once you are discharged, as it is imperative that you return to your primary care physician (or establish a relationship with a primary care physician if you do not have one) for your aftercare needs so that they can reassess your need for medications and monitor your lab values. If you do not have a primary care physician, you can call [phone] for a physician referral.   Increase activity slowly   Complete by:  As directed         Allergies as of 05/10/2018      Reactions   Iodinated Diagnostic Agents Hives, Rash   Needs 13-hour prep before CT scans w/ contrast.  Can do Benadryl 50mg  PO for ESI's, etc   Latex Hives, Other (See Comments)   Skin irritation   Pineapple Hives   Sulfa Antibiotics Hives, Rash   Codeine Nausea And Vomiting      Medication List    STOP taking these medications   docusate sodium 100 MG capsule Commonly known as:  COLACE   losartan 25 MG tablet Commonly known as:  COZAAR   oxyCODONE 5 MG immediate release tablet Commonly known as:  Oxy IR/ROXICODONE     TAKE these medications   aspirin 81 MG EC tablet Take 1 tablet (81 mg total) by mouth daily.   atorvastatin 80 MG tablet Commonly known as:  LIPITOR Take 1 tablet (80 mg total) by mouth daily at 6 PM.   cefdinir 300 MG capsule Commonly known as:  OMNICEF Take 1 capsule (300 mg total) by mouth every 12 (twelve) hours for 2 days.   clopidogrel 75 MG tablet Commonly known as:  PLAVIX Take 1 tablet (75 mg total) by mouth daily.   Digoxin 62.5 MCG Tabs Take 0.0625 mg by mouth daily.   furosemide 20 MG tablet Commonly known as:  LASIX Take 1 tablet (20 mg total) by mouth daily.   ipratropium-albuterol 0.5-2.5 (3) MG/3ML Soln Commonly known as:  DUONEB Take 3 mLs by nebulization 2 (two) times daily.   ivabradine  7.5 MG Tabs tablet Commonly known as:  CORLANOR Take 1 tablet (7.5 mg total) by mouth 2 (two) times  daily with a meal.   nitroGLYCERIN 0.4 MG SL tablet Commonly known as:  NITROSTAT Place 1 tablet (0.4 mg total) under the tongue every 5 (five) minutes as needed for chest pain.   spironolactone 25 MG tablet Commonly known as:  ALDACTONE Take 0.5 tablets (12.5 mg total) by mouth daily.            Durable Medical Equipment  (From admission, onward)        Start     Ordered   05/08/18 1648  Heart failure home health orders  (Heart failure home health orders / Face to face)  Once    Comments:  Heart Failure Follow-up Care:  Verify follow-up appointments per Patient Discharge Instructions. Confirm transportation arranged. Reconcile home medications with discharge medication list. Remove discontinued medications from use. Assist patient/caregiver to manage medications using pill box. Reinforce low sodium food selection Assessments: Vital signs and oxygen saturation at each visit. Assess home environment for safety concerns, caregiver support and availability of low-sodium foods. Consult Education officer, museum, PT/OT, Dietitian, and CNA based on assessments. Perform comprehensive cardiopulmonary assessment. Notify MD for any change in condition or weight gain of 3 pounds in one day or 5 pounds in one week with symptoms. Daily Weights and Symptom Monitoring: Ensure patient has access to scales. Teach patient/caregiver to weigh daily before breakfast and after voiding using same scale and record.    Teach patient/caregiver to track weight and symptoms and when to notify Provider. Activity: Develop individualized activity plan with patient/caregiver.  Need HHRN and HHPT  Question Answer Comment  Heart Failure Follow-up Care [facility] ([facility]F) [facility] at [phone]   Lab frequency Other see comments   Fax lab results to AHF Clinic at [phone]   Diet Low Sodium Heart Healthy   Fluid restrictions: 2000 mL Fluid   Initiate Heart Failure Clinic Diuretic Protocol to be used by  [place] only ( to be ordered by Heart Failure Team Providers Only) Yes      05/08/18 1648       Follow-up Information    Bensimhon, Shaune Pascal, MD Follow up.   Specialty:  Cardiology Why:  His office will schedule follow-up appointment. Contact information: [address] [place] [address] [phone]           TOTAL DISCHARGE TIME: 35 mins  [facility] Pager [phone]  05/10/2018, 3:18 PM

## 2018-05-10 NOTE — Progress Notes (Signed)
Paramedicine Encounter    Patient ID: Brittany [name], female    DOB: 1961-07-24, 57 y.o.   MRN: [id number]    Patient Care Team: System, Pcp Not In as PCP - General Bensimhon, Shaune Pascal, MD as PCP - Cardiology (Cardiology)  Patient Active Problem List   Diagnosis Date Noted  . Recurrent pleural effusion on right 05/05/2018  . Acute respiratory failure ([place]) 05/05/2018  . Dyspnea 04/19/2018  . Chronic respiratory failure with hypoxia ([place])   . Acute on chronic systolic CHF (congestive heart failure) ([place]) 02/03/2018  . S/P CABG (coronary artery bypass graft) 02/03/2018  . Asthma   . Anxiety   . Allergy   . HCAP (healthcare-associated pneumonia) 03/15/2015  . Chest pain 03/15/2015  . Small cell lung cancer ([place]) 03/16/2014  . Protein-calorie malnutrition, severe ([place]) 03/14/2014  . Atrial fibrillation ([place]) 03/12/2014  . HTN (hypertension) 05/07/2012    Current Outpatient Medications:  .  atorvastatin (LIPITOR) 80 MG tablet, Take 1 tablet (80 mg total) by mouth daily at 6 PM., Disp: 30 tablet, Rfl: 1 .  Digoxin 62.5 MCG TABS, Take 0.0625 mg by mouth daily., Disp: 30 tablet, Rfl: 1 .  furosemide (LASIX) 20 MG tablet, Take 1 tablet (20 mg total) by mouth daily., Disp: 30 tablet, Rfl: 1 .  ivabradine (CORLANOR) 7.5 MG TABS tablet, Take 1 tablet (7.5 mg total) by mouth 2 (two) times daily with a meal., Disp: 60 tablet, Rfl: 1 .  spironolactone (ALDACTONE) 25 MG tablet, Take 0.5 tablets (12.5 mg total) by mouth daily., Disp: 30 tablet, Rfl: 1 .  aspirin 81 MG EC tablet, Take 1 tablet (81 mg total) by mouth daily., Disp: 30 tablet, Rfl: 1 .  cefdinir (OMNICEF) 300 MG capsule, Take 1 capsule (300 mg total) by mouth every 12 (twelve) hours for 2 days. (Patient not taking: Reported on 05/10/2018), Disp: 4 capsule, Rfl: 0 .  clopidogrel (PLAVIX) 75 MG tablet, Take 1 tablet (75 mg total) by mouth daily., Disp: 30 tablet, Rfl: 1 .  ipratropium-albuterol (DUONEB) 0.5-2.5 (3) MG/3ML SOLN,  Take 3 mLs by nebulization 2 (two) times daily., Disp: 360 mL, Rfl: 0 .  nitroGLYCERIN (NITROSTAT) 0.4 MG SL tablet, Place 1 tablet (0.4 mg total) under the tongue every 5 (five) minutes as needed for chest pain., Disp: 20 tablet, Rfl: 0 Allergies  Allergen Reactions  . Iodinated Diagnostic Agents Hives and Rash    Needs 13-hour prep before CT scans w/ contrast.  Can do Benadryl 50mg  PO for ESI's, etc  . Latex Hives and Other (See Comments)    Skin irritation  . Pineapple Hives  . Sulfa Antibiotics Hives and Rash  . Codeine Nausea And Vomiting     Social History   Socioeconomic History  . Marital status: Married    Spouse name: Not on file  . Number of children: Not on file  . Years of education: Not on file  . Highest education level: Not on file  Occupational History  . Not on file  Social Needs  . Financial resource strain: Not on file  . Food insecurity:    Worry: Not on file    Inability: Not on file  . Transportation needs:    Medical: Not on file    Non-medical: Not on file  Tobacco Use  . Smoking status: Former Smoker    Packs/day: 1.00    Years: 20.00    Pack years: 20.00    Types: Cigarettes    Last attempt to quit:  03/13/2014    Years since quitting: 4.1  . Smokeless tobacco: Never Used  Substance and Sexual Activity  . Alcohol use: Yes    Alcohol/week: 4.8 oz    Types: 8 Glasses of wine per week  . Drug use: No  . Sexual activity: Never  Lifestyle  . Physical activity:    Days per week: Not on file    Minutes per session: Not on file  . Stress: Not on file  Relationships  . Social connections:    Talks on phone: Not on file    Gets together: Not on file    Attends religious service: Not on file    Active member of club or organization: Not on file    Attends meetings of clubs or organizations: Not on file    Relationship status: Not on file  . Intimate partner violence:    Fear of current or ex partner: Not on file    Emotionally abused: Not on  file    Physically abused: Not on file    Forced sexual activity: Not on file  Other Topics Concern  . Not on file  Social History Narrative  . Not on file    Physical Exam  Pulmonary/Chest: Effort normal. No respiratory distress.  Abdominal: Soft. She exhibits no distension. There is no tenderness.  Musculoskeletal: She exhibits no edema.  Skin: Skin is warm and dry. She is not diaphoretic.        Future Appointments  Date Time Provider [place]  05/15/2018 10:00 AM [facility] PA/NP [facility] None  04/07/2019  9:30 AM Rexene Alberts, MD [facility]    ATF pt CAO x4 sitting in the living room watching tv with her sister.  Pt returned home to day from rehab; I had pt's medications and she had some also on her person.  She stated that she is still missing several medications but she does not have any money to afford any co-pay.  Pt has been out of work for " a long time and she hasn't gotten paid in several weeks".  Pt has a scale at home and understands the red zone. The zones, sodium intake and fluid intake was discussed during this visit. Pt stated that she became sick due to her not having her medications.  Pt denies chest pain, sob and dizziness.  Pt understands how to use her pill box.   Pt appeared a little anxious during this visit. Pt agrees to [facility] visits; I will discuss her financial situation with jackie during our weekly [facility] meeting.  rx bottles verified.    **rx called in :atorvastain  plavix  Pulse 83   Resp 16   Wt 101 lb (45.8 kg)   SpO2 96%   BMI 20.06 kg/m   BP 88/p  Brittany [name], EMT Paramedic 05/12/2018    ACTION: Home visit completed

## 2018-05-10 NOTE — Progress Notes (Signed)
Discharge order obtained.  IV removed intact, telemetry monitor removed.  Reviewed AVS with patient, including medications, activity/restrictions, follow-up appointments.  Patient verbalized understanding.  Questions asked and answered.  Belongings given to family/patient.  Copy of AVS signature form placed in chart.

## 2018-05-10 NOTE — Progress Notes (Addendum)
Advanced Heart Failure Rounding Note  PCP-Cardiologist: No primary care provider on file.   Subjective:    Weight down 1 lb on PO lasix. Creatinine stable. K 4.5. Afebrile. SBP 88-101.   Feeling better. Refuses HHRN. No CP, SOB, dizziness. Walking in hallways with no problem. Still has nonproductive cough. Just had 2 view CXR this am.    RHC 05/09/18: RA = 8 RV = 49/9 PA = 47/20 (32) PCW = 19 Fick cardiac output/index = 4.3/3.1 PVR = 3.0 wu AO sat = 93% PA sat = 62%, 64% Assessment: 1. Minimally elevated left heart pressures 2. Mild PAH 3. Normal cardiac output  Objective:   Weight Range: 100 lb 8.5 oz (45.6 kg) Body mass index is 19.96 kg/m.   Vital Signs:   Temp:  [98.5 F (36.9 C)-99 F (37.2 C)] 98.9 F (37.2 C) (05/31 0522) Pulse Rate:  [77-94] 77 (05/31 0522) Resp:  [10-51] 16 (05/31 0522) BP: (88-113)/(53-72) 94/53 (05/31 0522) SpO2:  [92 %-100 %] 100 % (05/31 0522) Weight:  [100 lb 8.5 oz (45.6 kg)] 100 lb 8.5 oz (45.6 kg) (05/31 0522) Last BM Date: 05/08/18  Weight change: Filed Weights   05/08/18 0514 05/09/18 0541 05/10/18 0522  Weight: 99 lb 12.8 oz (45.3 kg) 101 lb 9.6 oz (46.1 kg) 100 lb 8.5 oz (45.6 kg)   Intake/Output:   Intake/Output Summary (Last 24 hours) at 05/10/2018 0753 Last data filed at 05/09/2018 2130 Gross per 24 hour  Intake 840 ml  Output 450 ml  Net 390 ml    Physical Exam    General:Thin. No resp difficulty. HEENT: Normal Neck: Supple. JVP 6-7. Carotids 2+ bilat; no bruits. No thyromegaly or nodule noted. Cor: PMI nondisplaced. RRR, No M/G/R noted Lungs: CTAB, normal effort. Abdomen: Soft, non-tender, non-distended, no HSM. No bruits or masses. +BS  Extremities: No cyanosis, clubbing, or rash. R and LLE no edema.  Neuro: Alert & orientedx3, cranial nerves grossly intact. moves all 4 extremities w/o difficulty. Affect pleasant   Telemetry  NSR 70-80s. Personally reviewed.   Labs    CBC Recent Labs   05/09/18 0405  WBC 7.3  NEUTROABS 4.6  HGB 10.2*  HCT 33.6*  MCV 88.9  PLT 938   Basic Metabolic Panel Recent Labs    05/09/18 0405 05/10/18 0346  NA 137 138  K 4.4 4.5  CL 100* 105  CO2 31 28  GLUCOSE 139* 134*  BUN 26* 27*  CREATININE 0.97 1.01*  CALCIUM 9.6 9.2   Liver Function Tests No results for input(s): AST, ALT, ALKPHOS, BILITOT, PROT, ALBUMIN in the last 72 hours. No results for input(s): LIPASE, AMYLASE in the last 72 hours. Cardiac Enzymes No results for input(s): CKTOTAL, CKMB, CKMBINDEX, TROPONINI in the last 72 hours.  BNP: BNP (last 3 results) Recent Labs    02/15/18 0449 04/19/18 1218 05/05/18 0142  BNP 2,526.7* 1,663.5* 2,601.1*    ProBNP (last 3 results) No results for input(s): PROBNP in the last 8760 hours.   D-Dimer No results for input(s): DDIMER in the last 72 hours. Hemoglobin A1C No results for input(s): HGBA1C in the last 72 hours. Fasting Lipid Panel No results for input(s): CHOL, HDL, LDLCALC, TRIG, CHOLHDL, LDLDIRECT in the last 72 hours. Thyroid Function Tests No results for input(s): TSH, T4TOTAL, T3FREE, THYROIDAB in the last 72 hours.  Invalid input(s): FREET3  Other results:   Imaging    No results found.   Medications:     Scheduled Medications: .  aspirin EC  81 mg Oral Daily  . atorvastatin  80 mg Oral q1800  . cefdinir  300 mg Oral Q12H  . clopidogrel  75 mg Oral Daily  . digoxin  0.0625 mg Oral Daily  . enoxaparin (LOVENOX) injection  30 mg Subcutaneous Q24H  . feeding supplement (ENSURE ENLIVE)  237 mL Oral TID WC  . furosemide  20 mg Oral Daily  . ipratropium-albuterol  3 mL Nebulization BID  . ivabradine  7.5 mg Oral BID WC  . sodium chloride flush  3 mL Intravenous Q12H  . sodium chloride flush  3 mL Intravenous Q12H  . spironolactone  12.5 mg Oral QHS    Infusions: . sodium chloride    . 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   Recent admit with NSTEMI and shock. Underwent emergent cath 02/03/18 showed LAD 100% stenosed, LCx 95% stenosed. Taken for emergent CABG 02/03/18. Required impella post op. Hospital course complicated by cardiogenic shock, HCAP, A fib, respiratory failure, and swallowing issues.  Admit from HF clinic with acute dyspnea.   Assessment/Plan   1. Acute on chronic hypoxic respiratory failure - likely multifactorial due to HF, PNA and recurrent R pleural effusion - PCT 2.07 BNP 2,600 (up from 1,600) - Now improved with diuresis, abx and repeat right thoracentesis - Continue PO abx. Afebrile overnight.  - Watch for recurrent pleural effusion. Suspect she may need Pleurex in the future.  - Co-ox on last admit was 65% arguing against shock. Cope 05/09/18: PA sat 62% and 64% with CO 4.3, CI 3.1  2. Acute on chronic Systolic HF  - has iCM related to large NSTEMI with LAD occlusion requiring urgent CABG 2/19 and Impella supprot -TEE 02/03/18 LVEF 20%, Mild to Mod AR, Mild/Mod MR, RV normal.   - Echo 4/19 EF 25 with moderate aortic insufficiency  - RHC 05/09/18 showed minimally elevated left sided pressures (PA 47/20(32) and PCW 19) with normal CO 4.3, CI 3.1 - Volume status stable on exam, mildly elevated left sided pressures on cath yesterday but did not receive lasix until afterward. Weight is down 1 lb.  - Continue lasix po 20 mg daily.  - Continue digoxin 0.125 mg daily - Continue ivabridine 7.5 mg BID - Continue spiro 12.5 mg daily.  - BP had been too low for ACE/ARB/ARNI or  b-blocker  3. CAD - s/p NSTEMI/STEMIin 2/19 Cath with severe 2v CAD and severe LV dysfunction/ICM. s/p Emergent CABG 02/03/18 requiring Impella support - No s/s ischemia.   -Continue plavix, asa, atorvastatin.  4.PAF:  CHA2DS2/VASc is at least 4. (CHF, Vasc disease, HTN, Female).  - She has history of Afib RVR in the past in the setting of her Lung CA. Previously on Xarelto but stopped her medications.  - Maintaining NSR - Will continue ASA and Plavix. No anticoagulation with h/o lung cancer.  5. 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  - Chest CT 02/19/18 with mass-like consolidation in R hilum concerning for recurrent tumor. -Repeat CT 03/27/2018 -No definite findings of locally recurrent tumor in the right lung. 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 Continued chest CT surveillance is advised in 3-6 months. - cytology from pleural effusion this admission negative  - Needs follow up with Dr Julien Nordmann.  - No change to current plan.    6. Aortic insufficiency: Moderate-severe on postop echo. Likely potentiating CHF. - Echo 04/20/18 LVEF 20%, Mild/Mod Aortic regurg.  - No change.   7. Severe protein-calorie malnutrition  - Nutrition consulted. No change.   She is refusing HH. She is agreeable to HF paramedicine. Darnelle Bos will speak with her today.   Stable for discharge from HF perspective. HF follow up has been arranged. She will need a CXR in 1 week.  HF meds for discharge: ASA 81 mg daily Atorvastatin 80 mg daily Plavix 75 mg daily Digoxin 0.0625 mg daily Lasix 20 mg daily Ivabradine 7.5 mg daily Spironolactone 12.5 mg daily  Length of Stay: Uriah, NP  05/10/2018, 7:53 AM  Advanced Heart Failure Team Pager [phone] (M-F; 7a - 4p)  Please contact [facility] for night-coverage after hours (4p -7a ) and weekends on [email or web address]  Patient seen and examined  with the above-signed Advanced Practice Provider and/or Housestaff. I personally reviewed laboratory data, imaging studies and relevant notes. I independently examined the patient and formulated the important aspects of the plan. I have edited the note to reflect any of my changes or salient points. I have personally discussed the plan with the patient and/or family.  Results of [place] reviewed with her. CXR viewed personally this am. No evidence of recurrent R pleural effusion. Overall improved.Refuses HHRN. Have arranged Paramedicine f/u. Will f/u in HF Clinic as well. If R pleural effusion reaccumulates will need Pleurex cath.   Glori Bickers, MD  7:00 PM

## 2018-05-15 ENCOUNTER — Encounter (HOSPITAL_COMMUNITY): Payer: BLUE CROSS/BLUE SHIELD

## 2018-05-16 ENCOUNTER — Telehealth (HOSPITAL_COMMUNITY): Payer: Self-pay

## 2018-05-16 NOTE — Telephone Encounter (Signed)
Pt was called before I stopped by her resisdence as she asked; no answer or call back.  Pt's phone isn't set up for  Voicemails.

## 2018-05-23 ENCOUNTER — Telehealth (HOSPITAL_COMMUNITY): Payer: Self-pay | Admitting: Surgery

## 2018-05-23 ENCOUNTER — Other Ambulatory Visit (HOSPITAL_COMMUNITY): Payer: Self-pay | Admitting: Surgery

## 2018-05-23 ENCOUNTER — Other Ambulatory Visit (HOSPITAL_COMMUNITY): Payer: Self-pay

## 2018-05-23 MED ORDER — IPRATROPIUM-ALBUTEROL 0.5-2.5 (3) MG/3ML IN SOLN: 3.0000 mL | Freq: Two times a day (BID) | RESPIRATORY_TRACT | 0 refills | Status: DC

## 2018-05-23 MED ORDER — SPIRONOLACTONE 25 MG PO TABS: 12.5000 mg | ORAL_TABLET | Freq: Every day | ORAL | 1 refills | Status: DC

## 2018-05-23 MED ORDER — FUROSEMIDE 20 MG PO TABS: 20.0000 mg | ORAL_TABLET | Freq: Every day | ORAL | 1 refills | Status: DC

## 2018-05-23 MED ORDER — CLOPIDOGREL BISULFATE 75 MG PO TABS: 75.0000 mg | ORAL_TABLET | Freq: Every day | ORAL | 1 refills | Status: DC

## 2018-05-23 MED ORDER — ASPIRIN 81 MG PO TBEC: 81.0000 mg | DELAYED_RELEASE_TABLET | Freq: Every day | ORAL | 1 refills | Status: DC

## 2018-05-23 MED ORDER — DIGOXIN 62.5 MCG PO TABS: 0.0625 mg | ORAL_TABLET | Freq: Every day | ORAL | 1 refills | Status: DC

## 2018-05-23 MED ORDER — NITROGLYCERIN 0.4 MG SL SUBL: 0.4000 mg | SUBLINGUAL_TABLET | SUBLINGUAL | 0 refills | Status: DC | PRN

## 2018-05-23 MED ORDER — ATORVASTATIN CALCIUM 80 MG PO TABS: 80.0000 mg | ORAL_TABLET | Freq: Every day | ORAL | 1 refills | Status: DC

## 2018-05-23 MED FILL — ATORVASTATIN 80 MG TABLET: 80 | 30 days supply | Qty: 30 | Fill #0

## 2018-05-23 MED FILL — CLOPIDOGREL 75 MG TABLET: 75 | 30 days supply | Qty: 30 | Fill #0

## 2018-05-23 MED FILL — NITROGLYCERIN 0.4 MG TAB SL: 0.4 | 20 days supply | Qty: 25 | Fill #0

## 2018-05-23 MED FILL — FUROSEMIDE 20 MG TABS: 20 | 30 days supply | Qty: 30 | Fill #0

## 2018-05-23 MED FILL — SPIRONOLACTONE 25 MG TABLET: 25 | 30 days supply | Qty: 15 | Fill #0

## 2018-05-23 MED FILL — DIGOXIN 0.125 MG TABLET: 125 | 30 days supply | Qty: 15 | Fill #0

## 2018-05-23 MED FILL — IPRAT-ALBUT 0.5-3(2.5) MG/3: 0.5-2.5 (3) | 30 days supply | Qty: 180 | Fill #0

## 2018-05-23 MED FILL — ASPIRIN ADULT LOW STRENGTH: 81 | 30 days supply | Qty: 30 | Fill #0

## 2018-05-23 NOTE — Progress Notes (Signed)
Paramedicine Encounter    Patient ID: Brittany [name], female    DOB: 30-Jun-1961, 57 y.o.   MRN: [id number]    Patient Care Team: Scifres, Durel Salts as PCP - General (Physician Assistant) [name], Shaune Pascal, MD as PCP - Cardiology (Cardiology)  Patient Active Problem List   Diagnosis Date Noted  . Recurrent pleural effusion on right 05/05/2018  . Acute respiratory failure ([place]) 05/05/2018  . Dyspnea 04/19/2018  . Chronic respiratory failure with hypoxia ([place])   . Acute on chronic systolic CHF (congestive heart failure) ([place]) 02/03/2018  . S/P CABG (coronary artery bypass graft) 02/03/2018  . Asthma   . Anxiety   . Allergy   . HCAP (healthcare-associated pneumonia) 03/15/2015  . Chest pain 03/15/2015  . Small cell lung cancer ([place]) 03/16/2014  . Protein-calorie malnutrition, severe ([place]) 03/14/2014  . Atrial fibrillation ([place]) 03/12/2014  . HTN (hypertension) 05/07/2012    Current Outpatient Medications:  .  aspirin 81 MG EC tablet, Take 1 tablet (81 mg total) by mouth daily., Disp: 30 tablet, Rfl: 1 .  atorvastatin (LIPITOR) 80 MG tablet, Take 1 tablet (80 mg total) by mouth daily at 6 PM., Disp: 30 tablet, Rfl: 1 .  clopidogrel (PLAVIX) 75 MG tablet, Take 1 tablet (75 mg total) by mouth daily., Disp: 30 tablet, Rfl: 1 .  Digoxin 62.5 MCG TABS, Take 0.0625 mg by mouth daily., Disp: 30 tablet, Rfl: 1 .  furosemide (LASIX) 20 MG tablet, Take 1 tablet (20 mg total) by mouth daily., Disp: 30 tablet, Rfl: 1 .  ipratropium-albuterol (DUONEB) 0.5-2.5 (3) MG/3ML SOLN, Take 3 mLs by nebulization 2 (two) times daily., Disp: 360 mL, Rfl: 0 .  ivabradine (CORLANOR) 7.5 MG TABS tablet, Take 1 tablet (7.5 mg total) by mouth 2 (two) times daily with a meal., Disp: 60 tablet, Rfl: 1 .  nitroGLYCERIN (NITROSTAT) 0.4 MG SL tablet, Place 1 tablet (0.4 mg total) under the tongue every 5 (five) minutes as needed for chest pain., Disp: 20 tablet, Rfl: 0 .  spironolactone (ALDACTONE) 25 MG  tablet, Take 0.5 tablets (12.5 mg total) by mouth daily., Disp: 30 tablet, Rfl: 1 Allergies  Allergen Reactions  . Iodinated Diagnostic Agents Hives and Rash    Needs 13-hour prep before CT scans w/ contrast.  Can do Benadryl 50mg  PO for ESI's, etc  . Latex Hives and Other (See Comments)    Skin irritation  . Pineapple Hives  . Sulfa Antibiotics Hives and Rash  . Codeine Nausea And Vomiting     Social History   Socioeconomic History  . Marital status: Married    Spouse name: Not on file  . Number of children: Not on file  . Years of education: Not on file  . Highest education level: Not on file  Occupational History  . Not on file  Social Needs  . Financial resource strain: Not on file  . Food insecurity:    Worry: Not on file    Inability: Not on file  . Transportation needs:    Medical: Not on file    Non-medical: Not on file  Tobacco Use  . Smoking status: Former Smoker    Packs/day: 1.00    Years: 20.00    Pack years: 20.00    Types: Cigarettes    Last attempt to quit: 03/13/2014    Years since quitting: 4.1  . Smokeless tobacco: Never Used  Substance and Sexual Activity  . Alcohol use: Yes    Alcohol/week: 4.8 oz  Types: 8 Glasses of wine per week  . Drug use: No  . Sexual activity: Never  Lifestyle  . Physical activity:    Days per week: Not on file    Minutes per session: Not on file  . Stress: Not on file  Relationships  . Social connections:    Talks on phone: Not on file    Gets together: Not on file    Attends religious service: Not on file    Active member of club or organization: Not on file    Attends meetings of clubs or organizations: Not on file    Relationship status: Not on file  . Intimate partner violence:    Fear of current or ex partner: Not on file    Emotionally abused: Not on file    Physically abused: Not on file    Forced sexual activity: Not on file  Other Topics Concern  . Not on file  Social History Narrative  . Not on  file    Physical Exam  Pulmonary/Chest: Effort normal. No respiratory distress.  Abdominal: Soft. She exhibits no distension.  Musculoskeletal: She exhibits no edema.  Skin: Skin is warm and dry. She is not diaphoretic.        Future Appointments  Date Time Provider [place]  05/30/2018  9:30 AM [facility] PA/NP [facility] None  04/07/2019  9:30 AM Rexene Alberts, MD [facility]    ATF pt CAO x4 watching tv. Pt stated that she has had trouble swallowing since her last hospital stay. She stated that the episodes aren't happening while she's eating.  Pt stated that she is also having trouble with her balance sometimes when she gets up; not dizziness per pt.  She hasn't seen her PCP since her hospital stay and she's not receiving physical therapy or home nurse visits.  Pt had physical therapy while she was in rehab and she was "getting better".  Pt was advised that she need to make an appointment with her pcp and the wellness nurse program. rx bottles verified and pill box verified.  Pt is no longer on losartan, which she had in her pill box.  I verified with [name] at the heart failure clinic that she is no longer on it.  Pt stated that she ate fried eggs and potatoe chips today.  She stated that she is aware that she shouldn't eat them but she "couldn't help herself".  Pt is still out of several medications; paying the copayment has been a real struggle for her since she has been out of work.  Im working with Darnelle Bos with the heart failure clinic to see if she can get assistance.   BP 108/70 (BP Location: Right Arm, Patient Position: Sitting, Cuff Size: Normal)   Pulse 67   Resp 16   Wt 98 lb (44.5 kg)   SpO2 98%   BMI 19.46 kg/m   Weight yesterday-100lbs but it was the day before yesterday Last visit weight- 101lbs    Pierson Vantol, EMT Paramedic 05/23/2018    ACTION: Home visit completed

## 2018-05-23 NOTE — Telephone Encounter (Signed)
[facility] Clinical biochemist with patient in her home and calling in.  She tells me that patient has lost her job and therefore her insurance.  She also has relocated to live with her sister and is missing many medications.  I will send all medications to the [facility] to be filled through the [facility].

## 2018-05-23 NOTE — Telephone Encounter (Signed)
According to [facility] patient still has insurance for now.  HF Community Paramedic will pick up medications and deliver them to patient's home.

## 2018-05-30 ENCOUNTER — Ambulatory Visit (HOSPITAL_COMMUNITY)
Admission: RE | Admit: 2018-05-30 | Discharge: 2018-05-30 | Disposition: A | Discharge status: Home / Self Care | Payer: BLUE CROSS/BLUE SHIELD | Source: Ambulatory Visit | Attending: Cardiology | Admitting: Cardiology

## 2018-05-30 ENCOUNTER — Other Ambulatory Visit (HOSPITAL_COMMUNITY): Payer: Self-pay

## 2018-05-30 ENCOUNTER — Encounter (HOSPITAL_COMMUNITY): Payer: Self-pay

## 2018-05-30 VITALS — BP 102/74 | HR 92 | Wt 96.8 lb

## 2018-05-30 DIAGNOSIS — I25810 Atherosclerosis of coronary artery bypass graft(s) without angina pectoris: Secondary | ICD-10-CM | POA: Diagnosis not present

## 2018-05-30 DIAGNOSIS — Z923 Personal history of irradiation: Secondary | ICD-10-CM | POA: Diagnosis not present

## 2018-05-30 DIAGNOSIS — I351 Nonrheumatic aortic (valve) insufficiency: Secondary | ICD-10-CM | POA: Diagnosis not present

## 2018-05-30 DIAGNOSIS — J45909 Unspecified asthma, uncomplicated: Secondary | ICD-10-CM | POA: Insufficient documentation

## 2018-05-30 DIAGNOSIS — I252 Old myocardial infarction: Secondary | ICD-10-CM | POA: Diagnosis not present

## 2018-05-30 DIAGNOSIS — Z87891 Personal history of nicotine dependence: Secondary | ICD-10-CM | POA: Insufficient documentation

## 2018-05-30 DIAGNOSIS — Z7901 Long term (current) use of anticoagulants: Secondary | ICD-10-CM | POA: Diagnosis not present

## 2018-05-30 DIAGNOSIS — Z9221 Personal history of antineoplastic chemotherapy: Secondary | ICD-10-CM | POA: Diagnosis not present

## 2018-05-30 DIAGNOSIS — I110 Hypertensive heart disease with heart failure: Secondary | ICD-10-CM | POA: Diagnosis present

## 2018-05-30 DIAGNOSIS — I480 Paroxysmal atrial fibrillation: Secondary | ICD-10-CM | POA: Diagnosis not present

## 2018-05-30 DIAGNOSIS — R1310 Dysphagia, unspecified: Secondary | ICD-10-CM | POA: Diagnosis not present

## 2018-05-30 DIAGNOSIS — Z7902 Long term (current) use of antithrombotics/antiplatelets: Secondary | ICD-10-CM | POA: Insufficient documentation

## 2018-05-30 DIAGNOSIS — Z951 Presence of aortocoronary bypass graft: Secondary | ICD-10-CM | POA: Diagnosis not present

## 2018-05-30 DIAGNOSIS — D649 Anemia, unspecified: Secondary | ICD-10-CM | POA: Diagnosis not present

## 2018-05-30 DIAGNOSIS — I447 Left bundle-branch block, unspecified: Secondary | ICD-10-CM | POA: Diagnosis not present

## 2018-05-30 DIAGNOSIS — Z7982 Long term (current) use of aspirin: Secondary | ICD-10-CM | POA: Diagnosis not present

## 2018-05-30 DIAGNOSIS — I2510 Atherosclerotic heart disease of native coronary artery without angina pectoris: Secondary | ICD-10-CM | POA: Insufficient documentation

## 2018-05-30 DIAGNOSIS — C3490 Malignant neoplasm of unspecified part of unspecified bronchus or lung: Secondary | ICD-10-CM

## 2018-05-30 DIAGNOSIS — I5022 Chronic systolic (congestive) heart failure: Secondary | ICD-10-CM

## 2018-05-30 DIAGNOSIS — F419 Anxiety disorder, unspecified: Secondary | ICD-10-CM | POA: Diagnosis not present

## 2018-05-30 DIAGNOSIS — Z85118 Personal history of other malignant neoplasm of bronchus and lung: Secondary | ICD-10-CM | POA: Diagnosis not present

## 2018-05-30 DIAGNOSIS — Z79899 Other long term (current) drug therapy: Secondary | ICD-10-CM | POA: Insufficient documentation

## 2018-05-30 LAB — BASIC METABOLIC PANEL
ANION GAP: 8 (ref 5–15)
BUN: 13 mg/dL (ref 6–20)
CO2: 24 mmol/L (ref 22–32)
Calcium: 9.7 mg/dL (ref 8.9–10.3)
Chloride: 109 mmol/L (ref 101–111)
Creatinine, Ser: 1.03 mg/dL — ABNORMAL HIGH (ref 0.44–1.00)
GFR, EST NON AFRICAN AMERICAN: 59 mL/min — AB (ref >60)
Glucose, Bld: 139 mg/dL — ABNORMAL HIGH (ref 65–99)
POTASSIUM: 3.8 mmol/L (ref 3.5–5.1)
SODIUM: 141 mmol/L (ref 135–145)

## 2018-05-30 NOTE — Progress Notes (Signed)
PCP: Cardiologist: Dr Marlou Porch  Primary HF Cardiologist: Dr [name] CT Surgeon: Dr Roxy Manns Oncologist: Dr [name]   HPI: Brittany [name] a 57 y/o woman with a history of PAF,preveious smoker quit 4 years ago, hypertension, 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   Admitted 02/03/2018 with NSTEMI and shock. Underwent emergent cath 02/03/18 showed LAD 100% stenosed, LCx 95% stenosed. Taken for emergent CABG 02/03/18. Required impella post op. Hospital course complicated by cardiogenic shock, HCAP, A fib, respiratory failure, and swallowing issues. She was discharged to SNF. Discharge weight 103 pounds.   Admitted 5/10-5/16/19 from HF clinic with acute respiratory failure and concern for low output HF. She had been off medications PTA due to inability to afford them after leaving SNF. She required BiPAP on admit. CXR showed R pleural effusion. Had thoracentesis with cytology negative for malignancy. Co-ox was 65%. HF meds gradually added back. Required O2 at discharge. Discharged to SNF. DC weight: 101 lbs.   Admitted with increased dyspnea 5/25 through 05/10/2018. Recurrent respiratory failure. Underwent thoracentesis. Completed antibioitc course. Discharged to home.   Today she returns for HF follow up. Overall feeling fine. Says she is getting stronger everyday. Able to  Slowly walk 1 block. Denies PND/Orthopnea. Appetite ok. No fever or chills. Weight at home 96-98 pounds. Taking all medications. Followed by Paramedicine.    CT of chest 1. No definite findings of locally recurrent tumor in the right lung. 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 Continued chest CT surveillance is advised in 3-6 months. 2. No thoracic adenopathy or other findings of metastatic disease in the chest. 3. Small to moderate dependent  right pleural effusion, increased. 4. Stable mild cardiomegaly. 5. Mild patchy ground-glass opacities in both lungs are decreased in the interval, favor improved mild residual pulmonary edema. 6. New complete right middle lobe atelectasis.  ECHO 04/20/2018  Left ventricle: Basal function preserved remainder of ventrical serverely hypokinetic The cavity size was severely dilated. Wall thickness was normal. The estimated ejection fraction was 20%. Left ventricular diastolic function parameters were normal. - Aortic valve: There was mild to moderate regurgitation. - Mitral valve: There was mild regurgitation. - Atrial septum: No defect or patent foramen ovale was identified.  ECHO  03/31/2018  Left ventricle: Basal inferior and anterior function preserved   remainder of ventrical serverely hypokinetic with septal and   apical akinesis The cavity size was severely dilated. Wall   thickness was normal. The estimated ejection fraction was 25%.   Doppler parameters are consistent with both elevated ventricular   end-diastolic filling pressure and elevated left atrial filling   pressure. - Aortic valve: There was moderate regurgitation. - Mitral valve: There was mild regurgitation. - Left atrium: The atrium was mildly dilated. - Atrial septum: No defect or patent foramen ovale was identified.  ECHO 02/11/2018  - Left ventricle: The cavity size was normal. There was mild   concentric hypertrophy. Systolic function was severely reduced.   The estimated ejection fraction was in the range of 25% to 30%.   There is akinesis of the mid-apical anterior, lateral,   inferolateral, inferior, apical septal and apical myocardium.   There is severe hypokinesis of the basalanteroseptal and   inferoseptal myocardium. Features are consistent with a   pseudonormal left ventricular filling pattern, with concomitant   abnormal relaxation and increased filling pressure (grade 2   diastolic dysfunction).  Doppler parameters are consistent with   high ventricular filling pressure. - Aortic valve: There was severe regurgitation by pressure halftime   of 133 msec but visually only appears moderate. - Mitral valve: Calcified annulus. There was trivial regurgitation.   LHC 02/03/2018    Prox RCA to Mid RCA lesion is 25% stenosed.  Ost Ramus lesion is 70% stenosed.  Ost Cx to Prox Cx lesion is 50% stenosed.  Ost LAD to Prox LAD lesion is 100% stenosed.  LV end diastolic pressure is severely elevated.  Hemodynamic findings consistent with severe pulmonary hypertension.  Post intervention, there is a 100% residual stenosis.  Review of systems complete and found to be negative unless listed in HPI.   SH:  Social History   Socioeconomic History  . Marital status: Married    Spouse name: Not on file  . Number of children: Not on file  . Years of education: Not on file  . Highest education level: Not on file  Occupational History  . Not on file  Social Needs  . Financial resource strain: Not on file  . Food insecurity:    Worry: Not on file    Inability: Not on file  . Transportation needs:    Medical: Not on file    Non-medical: Not on file  Tobacco Use  . Smoking status: Former Smoker    Packs/day: 1.00    Years: 20.00    Pack years: 20.00    Types: Cigarettes    Last attempt to quit: 03/13/2014    Years since quitting: 4.2  . Smokeless tobacco: Never Used  Substance and Sexual Activity  . Alcohol use: Yes    Alcohol/week: 4.8 oz    Types: 8 Glasses of wine per week  . Drug use: No  . Sexual activity: Never  Lifestyle  . Physical activity:    Days per week: Not on file    Minutes per session: Not on file  . Stress: Not on file  Relationships  . Social connections:    Talks on phone: Not on file    Gets together: Not on file    Attends religious service: Not on file    Active member of club or organization: Not on file    Attends meetings of clubs or  organizations: Not on file    Relationship status: Not on file  . Intimate partner violence:    Fear of current or ex partner: Not on file    Emotionally abused: Not on file    Physically abused: Not on file    Forced sexual activity: Not on file  Other Topics Concern  . Not on file  Social History Narrative  . Not on file    FH:  Family History  Problem Relation Age of Onset  . Heart disease Mother   . Hypertension Mother   . Heart attack Mother   . Hypertension Maternal Grandmother   . Cancer Maternal Grandmother   . Diabetes Paternal Grandmother   . Stroke Neg Hx     Past Medical History:  Diagnosis Date  . Acute systolic congestive heart failure ([place]) 02/03/2018  . Allergy   . Anxiety   . Asthma   . Hypertension   . PAF (paroxysmal atrial fibrillation) ([place])   . Prophylactic measure 08/03/14-08/19/14   Prophyl. cranial radiation 24 Gy  . 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 Small cell lung cancer ([employer]) 03/16/2014    Current Outpatient Medications  Medication Sig Dispense Refill  . aspirin 81 MG EC tablet Take 1 tablet (81 mg total) by mouth daily. 30 tablet 1  . atorvastatin (LIPITOR) 80 MG tablet Take 1 tablet (80 mg total) by mouth daily at 6 PM. 30 tablet 1  . clopidogrel (PLAVIX) 75 MG tablet Take 1 tablet (75 mg total) by mouth daily. 30 tablet 1  . Digoxin 62.5 MCG TABS Take 0.0625 mg by mouth daily. 30 tablet 1  . furosemide (LASIX) 20 MG tablet Take 1 tablet (20 mg total) by mouth daily. 30 tablet 1  . ipratropium-albuterol (DUONEB) 0.5-2.5 (3) MG/3ML SOLN Take 3 mLs by nebulization 2 (two) times daily. 360 mL 0  . ivabradine (CORLANOR) 7.5 MG TABS tablet Take 1 tablet (7.5 mg total) by mouth 2 (two) times daily with a meal. 60 tablet 1  . nitroGLYCERIN (NITROSTAT) 0.4 MG SL tablet Place 1 tablet (0.4 mg total) under the tongue every 5  (five) minutes as needed for chest pain. 20 tablet 0  . spironolactone (ALDACTONE) 25 MG tablet Take 0.5 tablets (12.5 mg total) by mouth daily. 30 tablet 1   No current facility-administered medications for this encounter.     Vitals:   05/30/18 0932  BP: 102/74  Pulse: 92  SpO2: 98%  Weight: 96 lb 12.8 oz (43.9 kg)   Wt Readings from Last 3 Encounters:  05/30/18 96 lb 12.8 oz (43.9 kg)  05/23/18 98 lb (44.5 kg)  05/10/18 101 lb (45.8 kg)    PHYSICAL EXAM General:  Thin. Walked in the clinic. No resp difficulty HEENT: normal Neck: supple. no JVD. Carotids 2+ bilat; no bruits. No lymphadenopathy or thryomegaly appreciated. Cor: PMI nondisplaced. Regular rate & rhythm. No rubs, gallops or murmurs. Lungs: clear Abdomen: soft, nontender, nondistended. No hepatosplenomegaly. No bruits or masses. Good bowel sounds. Extremities: no cyanosis, clubbing, rash, edema Neuro: alert & orientedx3, cranial nerves grossly intact. moves all 4 extremities w/o difficulty. Affect pleasant   ASSESSMENT & PLAN: 1. Chronic Systolic HF  H/O cardiogenic shock in setting of #1: TEE 02/03/18 LVEF 20%, Mild to Mod AR, Mild/Mod MR, RV normal. Impella out 3/1.  Echo 3/4 EF 25-30% with moderate-severe aortic insufficiency which is likely contributing to CHF. Echo 04/2018: EF 20%, mild to mod AI, mild MR - NYHA III she has had functional improvement.  -Volume status stable. Continue lasix 20 mg daily. - No BB with recent low output.  - Continue digoxin 0.0625 mg daily - Continue spiro 12.5 mg daily - Continue losartan 12.5 mg daily - Continue corlanor 7.5 mg BID.  - Will be followed by HF paramedicine once discharge from SNF  2. Hypoxic respiratory failure - Has O2 PRN.  3. 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  Cath with severe 2v CAD as above with severe LV dysfunction/ICM. s/p Emergent CABG 02/03/18.   - No s/s ischemia.  - Continue plavix, asa, atorvastatin.    4. PAF:    CHA2DS2/VASc is at least 4. (CHF, Vasc disease, HTN, Female).   - She has history of Afib RVR in the past in the setting of her Lung CA. Previously on Xarelto but stopped her medications.  - Will continue ASA and Plavix. No anticoagulation with h/o lung cancer. - Regular on exam today  5. 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  -  Chest CT 02/19/18 with mass-like consolidation in R hilum concerning for recurrent tumor.  -Repeat CT 03/27/2018 -No definite findings of locally recurrent tumor in the right lung. 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 Continued chest CT surveillance is advised in 3-6 months. - S/p thoracentesis 04/2018. Cytology negative for malignancy. Needs repeat CT in 6 months  6. Anemia:  - hgb 10.2 on 05/09/2018   7. Aortic insufficiency: Moderate-severe on postop echo.  Likely potentiating CHF.   - Mild to moderate on Echo 04/20/18.   8. Dysphagia - Swallow study with concerns for RLN palsy during recent hospitaliazation. Resolved  Follow up in 4 weeks.   Darrick Grinder, NP 9:53 AM

## 2018-05-30 NOTE — Patient Instructions (Signed)
Routine lab work today. Will notify you of abnormal results, otherwise no news is good news!  No changes to medication at this time.  Follow up 4 weeks.  ________________________________________________________________ Brittany [name] Code: 7353  Take all medication as prescribed the day of your appointment. Bring all medications with you to your appointment.  Do the following things EVERYDAY: 1) Weigh yourself in the morning before breakfast. Write it down and keep it in a log. 2) Take your medicines as prescribed 3) Eat low salt foods-Limit salt (sodium) to 2000 mg per day.  4) Stay as active as you can everyday 5) Limit all fluids for the day to less than 2 liters

## 2018-06-01 NOTE — Progress Notes (Signed)
Pt had a clinic appointment earlier today; she stated no changes to her medications during the visit.  Pt stated that she is feeling a lot better.  Pt's medications were paid for by the [facility] heart failure fund. I took her 8 refills today; next [facility] visit next week.

## 2018-06-19 ENCOUNTER — Telehealth (HOSPITAL_COMMUNITY): Payer: Self-pay

## 2018-06-19 ENCOUNTER — Telehealth (HOSPITAL_COMMUNITY): Payer: Self-pay | Admitting: Cardiology

## 2018-06-19 DIAGNOSIS — I5022 Chronic systolic (congestive) heart failure: Secondary | ICD-10-CM

## 2018-06-19 NOTE — Telephone Encounter (Signed)
Patient aware and repeat labs 7/16

## 2018-06-19 NOTE — Telephone Encounter (Signed)
Brittany [name] is in [place] visiting family since last week, I called today to see if she had returned.  Pt stated that she is still in [place].  She went to the hospital in [place] on Wednesday for SOB, she stated that the ED physician contacted Dr. Leonia Reeves whom told pt to increase the lasix to 40mg  daily.  Pt stated that she feels a lot better and she can walk up and down the steps with no issues. Pt plan on returning this weekend.

## 2018-06-19 NOTE — Telephone Encounter (Signed)
-----   Message from Shirley Friar, PA-C sent at 06/19/2018  9:57 AM EDT -----  Lasix increased at ED visit in [place]. Needs BMET early next week (Doesn't get back until this weekend)

## 2018-06-25 ENCOUNTER — Ambulatory Visit (HOSPITAL_COMMUNITY)
Admission: RE | Admit: 2018-06-25 | Discharge: 2018-06-25 | Disposition: A | Discharge status: Home / Self Care | Payer: BLUE CROSS/BLUE SHIELD | Source: Ambulatory Visit | Attending: Cardiology | Admitting: Cardiology

## 2018-06-25 DIAGNOSIS — I5022 Chronic systolic (congestive) heart failure: Secondary | ICD-10-CM | POA: Insufficient documentation

## 2018-06-25 LAB — BASIC METABOLIC PANEL
Anion gap: 10 (ref 5–15)
BUN: 19 mg/dL (ref 6–20)
CHLORIDE: 108 mmol/L (ref 98–111)
CO2: 26 mmol/L (ref 22–32)
CREATININE: 1 mg/dL (ref 0.44–1.00)
Calcium: 10.2 mg/dL (ref 8.9–10.3)
GFR calc non Af Amer: >60 mL/min (ref >60)
GLUCOSE: 158 mg/dL — AB (ref 70–99)
Potassium: 3.6 mmol/L (ref 3.5–5.1)
Sodium: 144 mmol/L (ref 135–145)

## 2018-06-28 ENCOUNTER — Other Ambulatory Visit (HOSPITAL_COMMUNITY): Payer: Self-pay

## 2018-06-28 ENCOUNTER — Telehealth (HOSPITAL_COMMUNITY): Payer: Self-pay | Admitting: *Deleted

## 2018-06-28 NOTE — Telephone Encounter (Signed)
Dee called with paramedicine to report patient is having trouble breathing while laying down.  Oxygen isn't really helping but when she wears her CPAP it helps.  Patient feels that her lungs are filling with fluid again.  No weight gain.  Patient is currently still out of town in [place].  I advised Dee to have patient go to the emergency room since she is having such trouble breathing.  [name] will instruct patient.

## 2018-06-28 NOTE — Progress Notes (Signed)
This is actually a telephone ennounter:  Pt was called to see if she was back in town/schedule a CHP visit.  Pt stated that she's still in [place] and she was thinking about going to the ED due to SOB. Pt stated that she can't lay down without becoming SOB and needing to sit up.  Pt has taken all of her medications as prescribed and she denies gaining weight.  Pt stated that she has been wearing the oxygen via [place] but feels a lot better when she uses her CPAP.    Susie at the heart failure clinic advised pt go to the ED since she's out of town and can't be evaluated.  Pt called back and told the same.  Pt plans to return to [place].

## 2018-07-02 MED FILL — FUROSEMIDE 20 MG TABS: 20 | 30 days supply | Qty: 30 | Fill #1

## 2018-07-02 MED FILL — CLOPIDOGREL 75 MG TABLET: 75 | 30 days supply | Qty: 30 | Fill #1

## 2018-07-02 MED FILL — ATORVASTATIN 80 MG TABLET: 80 | 30 days supply | Qty: 30 | Fill #1

## 2018-07-02 MED FILL — DIGOXIN 0.125 MG TABLET: 125 | 30 days supply | Qty: 15 | Fill #1

## 2018-07-02 MED FILL — SPIRONOLACTONE 25 MG TABLET: 25 | 30 days supply | Qty: 15 | Fill #1

## 2018-07-02 NOTE — Progress Notes (Signed)
PCP: Cardiologist: Dr Marlou Porch  Primary HF Cardiologist: Dr [name] CT Surgeon: Dr Roxy Manns Oncologist: Dr [name]   HPI: Brittany [name] is a 57 y.o. female with a history of PAF,preveious smoker quit 4 years ago, hypertension, 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   Admitted 02/03/2018 with NSTEMI and shock. Underwent emergent cath 02/03/18 showed LAD 100% stenosed, LCx 95% stenosed. Taken for emergent CABG 02/03/18. Required impella post op. Hospital course complicated by cardiogenic shock, HCAP, A fib, respiratory failure, and swallowing issues. She was discharged to SNF. Discharge weight 103 pounds.   Admitted 5/10-5/16/19 from HF clinic with acute respiratory failure and concern for low output HF. She had been off medications PTA due to inability to afford them after leaving SNF. She required BiPAP on admit. CXR showed R pleural effusion. Had thoracentesis with cytology negative for malignancy. Co-ox was 65%. HF meds gradually added back. Required O2 at discharge. Discharged to SNF. DC weight: 101 lbs.   Admitted with increased dyspnea 5/25 through 05/10/2018. Recurrent respiratory failure. Underwent thoracentesis. Completed antibioitc course. Discharged to home.   She went to [facility] in [place] twice with SOB. Lasix was increased from 20 mg to 40 mg daily the first visit. Most recently, she was admitted in [place] 7/19-7/22/19 and required IV lasix. Lasix was increased to 40 mg BID at discharge. (per patient, no records available in epic)  Today she returns for post hospital follow up. Overall doing better. She admits that she may have eaten some high salt foods prior to hospitalizations. SOB is improving. She is able to walk from waiting room with no SOB. She is walking daily 1/2-1 block with rest breaks and feels like she is starting to get stronger. She has dizziness with standing  quickly and with turning quickly. Denies CP. Denies orthopnea, PND, edema, or cough. She wears 2 L O2 PRN. She plans to return to [place] to stay with her sister. Weights stable 96-99 lbs at home. Typically limits salt and fluid intake. Taking all medications, but will run out in 1 week and does not have an income to pay her $140 copay for refills.   CT of chest 1. No definite findings of locally recurrent tumor in the right lung. 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 Continued chest CT surveillance is advised in 3-6 months. 2. No thoracic adenopathy or other findings of metastatic disease in the chest. 3. Small to moderate dependent right pleural effusion, increased. 4. Stable mild cardiomegaly. 5. Mild patchy ground-glass opacities in both lungs are decreased in the interval, favor improved mild residual pulmonary edema. 6. New complete right middle lobe atelectasis.  ECHO 04/20/2018  Left ventricle: Basal function preserved remainder of ventrical serverely hypokinetic The cavity size was severely dilated. Wall thickness was normal. The estimated ejection fraction was 20%. Left ventricular diastolic function parameters were normal. - Aortic valve: There was mild to moderate regurgitation. - Mitral valve: There was mild regurgitation. - Atrial septum: No defect or patent foramen ovale was identified.  ECHO  03/31/2018  Left ventricle: Basal inferior and anterior function preserved   remainder of ventrical serverely hypokinetic with septal and   apical akinesis The cavity size was severely dilated. Wall   thickness was normal. The estimated ejection fraction was 25%.   Doppler parameters are consistent with both elevated ventricular   end-diastolic filling pressure and elevated left  atrial filling   pressure. - Aortic valve: There was moderate regurgitation. - Mitral valve: There was mild  regurgitation. - Left atrium: The atrium was mildly dilated. - Atrial septum: No defect or patent foramen ovale was identified.  ECHO 02/11/2018  - Left ventricle: The cavity size was normal. There was mild   concentric hypertrophy. Systolic function was severely reduced.   The estimated ejection fraction was in the range of 25% to 30%.   There is akinesis of the mid-apical anterior, lateral,   inferolateral, inferior, apical septal and apical myocardium.   There is severe hypokinesis of the basalanteroseptal and   inferoseptal myocardium. Features are consistent with a   pseudonormal left ventricular filling pattern, with concomitant   abnormal relaxation and increased filling pressure (grade 2   diastolic dysfunction). Doppler parameters are consistent with   high ventricular filling pressure. - Aortic valve: There was severe regurgitation by pressure halftime   of 133 msec but visually only appears moderate. - Mitral valve: Calcified annulus. There was trivial regurgitation.   LHC 02/03/2018    Prox RCA to Mid RCA lesion is 25% stenosed.  Ost Ramus lesion is 70% stenosed.  Ost Cx to Prox Cx lesion is 50% stenosed.  Ost LAD to Prox LAD lesion is 100% stenosed.  LV end diastolic pressure is severely elevated.  Hemodynamic findings consistent with severe pulmonary hypertension.  Post intervention, there is a 100% residual stenosis.  Review of systems complete and found to be negative unless listed in HPI.   SH:  Social History   Socioeconomic History  . Marital status: Married    Spouse name: Not on file  . Number of children: Not on file  . Years of education: Not on file  . Highest education level: Not on file  Occupational History  . Not on file  Social Needs  . Financial resource strain: Not on file  . Food insecurity:    Worry: Not on file    Inability: Not on file  . Transportation needs:    Medical: Not on file    Non-medical: Not on file  Tobacco Use    . Smoking status: Former Smoker    Packs/day: 1.00    Years: 20.00    Pack years: 20.00    Types: Cigarettes    Last attempt to quit: 03/13/2014    Years since quitting: 4.3  . Smokeless tobacco: Never Used  Substance and Sexual Activity  . Alcohol use: Yes    Alcohol/week: 4.8 oz    Types: 8 Glasses of wine per week  . Drug use: No  . Sexual activity: Never  Lifestyle  . Physical activity:    Days per week: Not on file    Minutes per session: Not on file  . Stress: Not on file  Relationships  . Social connections:    Talks on phone: Not on file    Gets together: Not on file    Attends religious service: Not on file    Active member of club or organization: Not on file    Attends meetings of clubs or organizations: Not on file    Relationship status: Not on file  . Intimate partner violence:    Fear of current or ex partner: Not on file    Emotionally abused: Not on file    Physically abused: Not on file    Forced sexual activity: Not on file  Other Topics Concern  . Not on file  Social History Narrative  .  Not on file    FH:  Family History  Problem Relation Age of Onset  . Heart disease Mother   . Hypertension Mother   . Heart attack Mother   . Hypertension Maternal Grandmother   . Cancer Maternal Grandmother   . Diabetes Paternal Grandmother   . Stroke Neg Hx     Past Medical History:  Diagnosis Date  . Acute systolic congestive heart failure ([place]) 02/03/2018  . Allergy   . Anxiety   . Asthma   . Hypertension   . PAF (paroxysmal atrial fibrillation) ([place])   . Prophylactic measure 08/03/14-08/19/14   Prophyl. cranial radiation 24 Gy  . 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 Small cell lung cancer ([place]) 03/16/2014    Current Outpatient Medications  Medication Sig Dispense Refill  . aspirin 81 MG EC tablet Take 1 tablet (81 mg total) by  mouth daily. 30 tablet 1  . atorvastatin (LIPITOR) 80 MG tablet Take 1 tablet (80 mg total) by mouth daily at 6 PM. 30 tablet 1  . clopidogrel (PLAVIX) 75 MG tablet Take 1 tablet (75 mg total) by mouth daily. 30 tablet 1  . Digoxin 62.5 MCG TABS Take 0.0625 mg by mouth daily. 30 tablet 1  . furosemide (LASIX) 40 MG tablet Take 40 mg by mouth 2 (two) times daily.    Marland Kitchen ipratropium-albuterol (DUONEB) 0.5-2.5 (3) MG/3ML SOLN Take 3 mLs by nebulization 2 (two) times daily. 360 mL 0  . ivabradine (CORLANOR) 7.5 MG TABS tablet Take 1 tablet (7.5 mg total) by mouth 2 (two) times daily with a meal. 60 tablet 1  . nitroGLYCERIN (NITROSTAT) 0.4 MG SL tablet Place 1 tablet (0.4 mg total) under the tongue every 5 (five) minutes as needed for chest pain. 20 tablet 0  . spironolactone (ALDACTONE) 25 MG tablet Take 0.5 tablets (12.5 mg total) by mouth daily. 30 tablet 1   No current facility-administered medications for this encounter.     Vitals:   07/03/18 1048  BP: 102/70  Pulse: 63  SpO2: 99%  Weight: 99 lb (44.9 kg)   Wt Readings from Last 3 Encounters:  07/03/18 99 lb (44.9 kg)  05/30/18 96 lb 12.8 oz (43.9 kg)  05/23/18 98 lb (44.5 kg)   Orthostatics: Sitting 106/60 Standing 108/60 with mild dizziness  PHYSICAL EXAM General: Thin. No resp difficulty. HEENT: Normal Neck: Supple. JVP 5-6. Carotids 2+ bilat; no bruits. No thyromegaly or nodule noted. Cor: PMI nondisplaced. RRR, No M/G/R noted Lungs: CTAB, normal effort. Abdomen: Soft, non-tender, non-distended, no HSM. No bruits or masses. +BS  Extremities: No cyanosis, clubbing, or rash. R and LLE no edema.  Neuro: Alert & orientedx3, cranial nerves grossly intact. moves all 4 extremities w/o difficulty. Affect pleasant  ASSESSMENT & PLAN: 1. Chronic Systolic HF  H/O cardiogenic shock in setting of #1: TEE 02/03/18 LVEF 20%, Mild to Mod AR, Mild/Mod MR, RV normal. Impella out 3/1.  Echo 3/4 EF 25-30% with moderate-severe aortic  insufficiency which is likely contributing to CHF.  - Echo 04/2018: EF 20%, mild to mod AI, mild MR - NYHA III she has had functional improvement.  - Volume status stable. Continue lasix 40 mg BID - No BB with recent low output.  - Continue digoxin 0.0625 mg daily - Continue spiro 12.5 mg daily - Continue losartan 12.5 mg daily -  Continue corlanor 7.5 mg BID. No BP room for medication changes.  - Followed by HF paramedicine once back from visiting family in [place]  2. Hypoxic respiratory failure - Has O2 PRN. No change.   3. 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  Cath with severe 2v CAD as above with severe LV dysfunction/ICM. s/p Emergent CABG 02/03/18.   - No s/s ischemia - Continue plavix, asa, atorvastatin.    4. PAF:  CHA2DS2/VASc is at least 4. (CHF, Vasc disease, HTN, Female).   - She has history of Afib RVR in the past in the setting of her Lung CA. Previously on Xarelto but stopped her medications.  - Will continue ASA and Plavix. No anticoagulation with h/o lung cancer. - Regular on exam  5. 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  - Chest CT 02/19/18 with mass-like consolidation in R hilum concerning for recurrent tumor.  -Repeat CT 03/27/2018 -No definite findings of locally recurrent tumor in the right lung. 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 Continued chest CT surveillance is advised in 3-6 months. - S/p thoracentesis 04/2018. Cytology negative for malignancy. - Needs repeat CT in 6 months (November)  6. Anemia:  - hgb 10.2 on 05/09/2018. Anemia could be contributing to her symptoms. Check CBC today.   7. Aortic insufficiency: Moderate-severe on postop echo.  Likely potentiating CHF.   - Mild to moderate on Echo 04/20/18. No change.    8. Dysphagia - Swallow study with concerns for RLN palsy during recent hospitaliazation. Resolved.   9. Inability to afford  medications - No income as she has been out of work since January. She is on her [employer], so we cannot fill through [place], HF CSW helped her get medications today.   CBC, BMET today  Follow up in 3 weeks when she will be back in town. (offered 2 weeks, but she declined)  Georgiana Shore, NP 10:53 AM  Greater than 50% of the 25 minute visit was spent in counseling/coordination of care regarding disease state education, salt/fluid restriction, sliding scale diuretics, and medication compliance.

## 2018-07-03 ENCOUNTER — Other Ambulatory Visit (HOSPITAL_COMMUNITY): Payer: Self-pay

## 2018-07-03 ENCOUNTER — Encounter (HOSPITAL_COMMUNITY): Payer: Self-pay

## 2018-07-03 ENCOUNTER — Ambulatory Visit (HOSPITAL_COMMUNITY)
Admission: RE | Admit: 2018-07-03 | Discharge: 2018-07-03 | Disposition: A | Discharge status: Home / Self Care | Payer: BLUE CROSS/BLUE SHIELD | Source: Ambulatory Visit | Attending: Internal Medicine | Admitting: Internal Medicine

## 2018-07-03 ENCOUNTER — Telehealth (HOSPITAL_COMMUNITY): Payer: Self-pay | Admitting: Surgery

## 2018-07-03 VITALS — BP 102/70 | HR 63 | Wt 99.0 lb

## 2018-07-03 DIAGNOSIS — R1310 Dysphagia, unspecified: Secondary | ICD-10-CM | POA: Insufficient documentation

## 2018-07-03 DIAGNOSIS — J45909 Unspecified asthma, uncomplicated: Secondary | ICD-10-CM | POA: Diagnosis not present

## 2018-07-03 DIAGNOSIS — Z9221 Personal history of antineoplastic chemotherapy: Secondary | ICD-10-CM | POA: Insufficient documentation

## 2018-07-03 DIAGNOSIS — Z7902 Long term (current) use of antithrombotics/antiplatelets: Secondary | ICD-10-CM | POA: Diagnosis not present

## 2018-07-03 DIAGNOSIS — Z85118 Personal history of other malignant neoplasm of bronchus and lung: Secondary | ICD-10-CM | POA: Diagnosis not present

## 2018-07-03 DIAGNOSIS — I252 Old myocardial infarction: Secondary | ICD-10-CM | POA: Diagnosis not present

## 2018-07-03 DIAGNOSIS — I351 Nonrheumatic aortic (valve) insufficiency: Secondary | ICD-10-CM

## 2018-07-03 DIAGNOSIS — I110 Hypertensive heart disease with heart failure: Secondary | ICD-10-CM | POA: Diagnosis not present

## 2018-07-03 DIAGNOSIS — Z8709 Personal history of other diseases of the respiratory system: Secondary | ICD-10-CM

## 2018-07-03 DIAGNOSIS — Z598 Other problems related to housing and economic circumstances: Secondary | ICD-10-CM | POA: Insufficient documentation

## 2018-07-03 DIAGNOSIS — Z951 Presence of aortocoronary bypass graft: Secondary | ICD-10-CM | POA: Insufficient documentation

## 2018-07-03 DIAGNOSIS — Z923 Personal history of irradiation: Secondary | ICD-10-CM | POA: Insufficient documentation

## 2018-07-03 DIAGNOSIS — Z8249 Family history of ischemic heart disease and other diseases of the circulatory system: Secondary | ICD-10-CM | POA: Insufficient documentation

## 2018-07-03 DIAGNOSIS — Z79899 Other long term (current) drug therapy: Secondary | ICD-10-CM | POA: Insufficient documentation

## 2018-07-03 DIAGNOSIS — Z87891 Personal history of nicotine dependence: Secondary | ICD-10-CM | POA: Diagnosis not present

## 2018-07-03 DIAGNOSIS — I5022 Chronic systolic (congestive) heart failure: Secondary | ICD-10-CM

## 2018-07-03 DIAGNOSIS — I2510 Atherosclerotic heart disease of native coronary artery without angina pectoris: Secondary | ICD-10-CM | POA: Insufficient documentation

## 2018-07-03 DIAGNOSIS — Z7982 Long term (current) use of aspirin: Secondary | ICD-10-CM | POA: Diagnosis not present

## 2018-07-03 DIAGNOSIS — J9691 Respiratory failure, unspecified with hypoxia: Secondary | ICD-10-CM | POA: Diagnosis not present

## 2018-07-03 DIAGNOSIS — I25810 Atherosclerosis of coronary artery bypass graft(s) without angina pectoris: Secondary | ICD-10-CM | POA: Diagnosis not present

## 2018-07-03 DIAGNOSIS — D649 Anemia, unspecified: Secondary | ICD-10-CM | POA: Diagnosis not present

## 2018-07-03 DIAGNOSIS — I080 Rheumatic disorders of both mitral and aortic valves: Secondary | ICD-10-CM | POA: Insufficient documentation

## 2018-07-03 DIAGNOSIS — I480 Paroxysmal atrial fibrillation: Secondary | ICD-10-CM

## 2018-07-03 LAB — BASIC METABOLIC PANEL
ANION GAP: 10 (ref 5–15)
BUN: 18 mg/dL (ref 6–20)
CALCIUM: 10 mg/dL (ref 8.9–10.3)
CHLORIDE: 105 mmol/L (ref 98–111)
CO2: 26 mmol/L (ref 22–32)
Creatinine, Ser: 0.89 mg/dL (ref 0.44–1.00)
GFR calc Af Amer: >60 mL/min (ref >60)
GFR calc non Af Amer: >60 mL/min (ref >60)
GLUCOSE: 122 mg/dL — AB (ref 70–99)
POTASSIUM: 3.8 mmol/L (ref 3.5–5.1)
Sodium: 141 mmol/L (ref 135–145)

## 2018-07-03 LAB — CBC
HEMATOCRIT: 43.2 % (ref 36.0–46.0)
HEMOGLOBIN: 13.1 g/dL (ref 12.0–15.0)
MCH: 27.6 pg (ref 26.0–34.0)
MCHC: 30.3 g/dL (ref 30.0–36.0)
MCV: 90.9 fL (ref 78.0–100.0)
Platelets: 245 10*3/uL (ref 150–400)
RBC: 4.75 MIL/uL (ref 3.87–5.11)
RDW: 20.7 % — ABNORMAL HIGH (ref 11.5–15.5)
WBC: 6.7 10*3/uL (ref 4.0–10.5)

## 2018-07-03 MED ORDER — ATORVASTATIN CALCIUM 80 MG PO TABS: 80.0000 mg | ORAL_TABLET | Freq: Every day | ORAL | 1 refills | Status: DC

## 2018-07-03 MED ORDER — DIGOXIN 62.5 MCG PO TABS: 0.0625 mg | ORAL_TABLET | Freq: Every day | ORAL | 1 refills | Status: DC

## 2018-07-03 MED ORDER — SPIRONOLACTONE 25 MG PO TABS: 12.5000 mg | ORAL_TABLET | Freq: Every day | ORAL | 1 refills | Status: DC

## 2018-07-03 MED ORDER — CLOPIDOGREL BISULFATE 75 MG PO TABS: 75.0000 mg | ORAL_TABLET | Freq: Every day | ORAL | 1 refills | Status: DC

## 2018-07-03 MED ORDER — FUROSEMIDE 40 MG PO TABS: 40.0000 mg | ORAL_TABLET | Freq: Two times a day (BID) | ORAL | 3 refills | Status: DC

## 2018-07-03 MED ORDER — ASPIRIN 81 MG PO TBEC
81.0000 mg | DELAYED_RELEASE_TABLET | Freq: Every day | ORAL | 1 refills | Status: DC
Start: 2018-07-03 — End: 2020-10-26

## 2018-07-03 MED ORDER — IVABRADINE HCL 7.5 MG PO TABS: 7.5000 mg | ORAL_TABLET | Freq: Two times a day (BID) | ORAL | 1 refills | Status: DC

## 2018-07-03 NOTE — Telephone Encounter (Signed)
Patient will be discharged from [facility] due to the fact that she has relocated to live in [place] temporarily.  She will continue to follow in the AHF Clinic and can return to [facility] paramedic program when she returns to [facility].

## 2018-07-03 NOTE — Patient Instructions (Signed)
Routine lab work today. Will notify you of abnormal results, otherwise no news is good news!  Refills sent to pharmacy.  Follow up 3 weeks.  ____________________________________________________________________ Brittany [name] Code: 1500  Take all medication as prescribed the day of your appointment. Bring all medications with you to your appointment.  Do the following things EVERYDAY: 1) Weigh yourself in the morning before breakfast. Write it down and keep it in a log. 2) Take your medicines as prescribed 3) Eat low salt foods-Limit salt (sodium) to 2000 mg per day.  4) Stay as active as you can everyday 5) Limit all fluids for the day to less than 2 liters

## 2018-07-03 NOTE — Progress Notes (Signed)
CSW referred to assist patient with financial concerns.Patient states she is unable to afford her prescription co-pays. She has a pending disability application and currently has [employer] through her husband. She states she recently moved to [place] with family and is struggling financially as she has no income. CSW assisted with co-pays today and with the assistance of our CMA have a plan for the future to reduce her medication co-pays. Patient verbalizes understanding of plan and grateful for the assistance. CSW will follow up with patient on next visit. Raquel Sarna, [place], [place]

## 2018-07-03 NOTE — Progress Notes (Signed)
Paramedicine Encounter   Patient ID: Brittany [name] , female,   DOB: 09-15-1961,56 y.o.,  MRN: [id number]   Met patient in clinic today with provider Ashely.   Pt stated that she has increased sob when walking; pt is having "short periods" of dizziness. Pt is still able to sleep laying down without an increae in pillows.  She stated that the only time that she has issues with sleeping is when she has fluid retention.  Pt stated that she becomes anxious when she has heartburn, due to it being a symptom of the last heart attack that she had.  Ashely explained to her that it was ok to take an anacid to determine if it's heartburn or a cardiac event.  She's currently living with her family in [place]:  Talked with pt about her disablity case; she's waiting for information from [facility] and the heart failure clinic.  Pt stated that she hasn't worked since January this year.  She stated that she still has [employer] and she having issues affording her prescriptions. Pt is currently under her husbands insurance. Kennyth Lose offered pt a $25 gift card for [employer].  [name] advised that pt's co-pay is $139.   Ashely: took ortho static which was no change; blood work ordered due to the lasix increase. Follow up in 3 weeks.      Due to pt currently living in [place] she has been D/C from the [facility] program.  **rx changes made during the visit: none  Time spent with patient 33mns   Eveleigh Crumpler, EMT-Paramedic [phone]/24/2019   ACTION: Next call planned for D/C from program

## 2018-07-26 ENCOUNTER — Encounter (HOSPITAL_COMMUNITY): Payer: BLUE CROSS/BLUE SHIELD

## 2018-07-30 ENCOUNTER — Encounter (HOSPITAL_COMMUNITY): Payer: Self-pay | Admitting: *Deleted

## 2018-07-30 NOTE — Progress Notes (Signed)
Received medical records request from [facility] , CASE # [id number]  Requested records faxed today to: [phone]   Original request will be scanned to patient's electronic medical record.

## 2018-07-31 ENCOUNTER — Encounter (HOSPITAL_COMMUNITY): Payer: BLUE CROSS/BLUE SHIELD

## 2018-08-07 ENCOUNTER — Encounter (HOSPITAL_COMMUNITY): Payer: BLUE CROSS/BLUE SHIELD

## 2018-08-15 HISTORY — PX: CARDIAC DEFIBRILLATOR PLACEMENT: SHX171

## 2018-09-04 DIAGNOSIS — J449 Chronic obstructive pulmonary disease, unspecified: Secondary | ICD-10-CM | POA: Insufficient documentation

## 2018-09-10 DIAGNOSIS — G8918 Other acute postprocedural pain: Secondary | ICD-10-CM | POA: Insufficient documentation

## 2018-11-15 ENCOUNTER — Ambulatory Visit (HOSPITAL_COMMUNITY)
Admission: RE | Admit: 2018-11-15 | Discharge: 2018-11-15 | Disposition: A | Discharge status: Home / Self Care | Payer: BLUE CROSS/BLUE SHIELD | Source: Ambulatory Visit | Attending: Internal Medicine | Admitting: Internal Medicine

## 2018-11-15 ENCOUNTER — Other Ambulatory Visit: Payer: Self-pay

## 2018-11-15 ENCOUNTER — Encounter (HOSPITAL_COMMUNITY): Payer: Self-pay | Admitting: Internal Medicine

## 2018-11-15 VITALS — BP 106/66 | HR 85 | Wt 102.4 lb

## 2018-11-15 DIAGNOSIS — R1310 Dysphagia, unspecified: Secondary | ICD-10-CM | POA: Insufficient documentation

## 2018-11-15 DIAGNOSIS — D649 Anemia, unspecified: Secondary | ICD-10-CM | POA: Insufficient documentation

## 2018-11-15 DIAGNOSIS — I110 Hypertensive heart disease with heart failure: Secondary | ICD-10-CM | POA: Insufficient documentation

## 2018-11-15 DIAGNOSIS — I480 Paroxysmal atrial fibrillation: Secondary | ICD-10-CM | POA: Diagnosis not present

## 2018-11-15 DIAGNOSIS — Z8249 Family history of ischemic heart disease and other diseases of the circulatory system: Secondary | ICD-10-CM | POA: Diagnosis not present

## 2018-11-15 DIAGNOSIS — Z85118 Personal history of other malignant neoplasm of bronchus and lung: Secondary | ICD-10-CM | POA: Insufficient documentation

## 2018-11-15 DIAGNOSIS — Z79899 Other long term (current) drug therapy: Secondary | ICD-10-CM | POA: Diagnosis not present

## 2018-11-15 DIAGNOSIS — J90 Pleural effusion, not elsewhere classified: Secondary | ICD-10-CM

## 2018-11-15 DIAGNOSIS — C3490 Malignant neoplasm of unspecified part of unspecified bronchus or lung: Secondary | ICD-10-CM

## 2018-11-15 DIAGNOSIS — Z8709 Personal history of other diseases of the respiratory system: Secondary | ICD-10-CM

## 2018-11-15 DIAGNOSIS — J9691 Respiratory failure, unspecified with hypoxia: Secondary | ICD-10-CM | POA: Diagnosis not present

## 2018-11-15 DIAGNOSIS — I5022 Chronic systolic (congestive) heart failure: Secondary | ICD-10-CM | POA: Diagnosis not present

## 2018-11-15 DIAGNOSIS — Z87891 Personal history of nicotine dependence: Secondary | ICD-10-CM | POA: Insufficient documentation

## 2018-11-15 DIAGNOSIS — F419 Anxiety disorder, unspecified: Secondary | ICD-10-CM | POA: Insufficient documentation

## 2018-11-15 DIAGNOSIS — Z7982 Long term (current) use of aspirin: Secondary | ICD-10-CM | POA: Insufficient documentation

## 2018-11-15 DIAGNOSIS — I509 Heart failure, unspecified: Secondary | ICD-10-CM

## 2018-11-15 DIAGNOSIS — I351 Nonrheumatic aortic (valve) insufficiency: Secondary | ICD-10-CM | POA: Diagnosis not present

## 2018-11-15 DIAGNOSIS — I252 Old myocardial infarction: Secondary | ICD-10-CM | POA: Diagnosis not present

## 2018-11-15 DIAGNOSIS — Z951 Presence of aortocoronary bypass graft: Secondary | ICD-10-CM | POA: Insufficient documentation

## 2018-11-15 DIAGNOSIS — R0609 Other forms of dyspnea: Secondary | ICD-10-CM

## 2018-11-15 LAB — COMPREHENSIVE METABOLIC PANEL
ALK PHOS: 76 U/L (ref 38–126)
ALT: 17 U/L (ref 0–44)
AST: 27 U/L (ref 15–41)
Albumin: 4.1 g/dL (ref 3.5–5.0)
Anion gap: 12 (ref 5–15)
BILIRUBIN TOTAL: 0.9 mg/dL (ref 0.3–1.2)
BUN: 17 mg/dL (ref 6–20)
CALCIUM: 10 mg/dL (ref 8.9–10.3)
CO2: 27 mmol/L (ref 22–32)
Chloride: 101 mmol/L (ref 98–111)
Creatinine, Ser: 1 mg/dL (ref 0.44–1.00)
GFR calc Af Amer: >60 mL/min (ref >60)
GFR calc non Af Amer: >60 mL/min (ref >60)
GLUCOSE: 168 mg/dL — AB (ref 70–99)
Potassium: 3.7 mmol/L (ref 3.5–5.1)
Sodium: 140 mmol/L (ref 135–145)
TOTAL PROTEIN: 7.2 g/dL (ref 6.5–8.1)

## 2018-11-15 LAB — CBC
HCT: 42.9 % (ref 36.0–46.0)
Hemoglobin: 12.2 g/dL (ref 12.0–15.0)
MCH: 26 pg (ref 26.0–34.0)
MCHC: 28.4 g/dL — AB (ref 30.0–36.0)
MCV: 91.3 fL (ref 80.0–100.0)
Platelets: 304 10*3/uL (ref 150–400)
RBC: 4.7 MIL/uL (ref 3.87–5.11)
RDW: 20.2 % — ABNORMAL HIGH (ref 11.5–15.5)
WBC: 7.3 10*3/uL (ref 4.0–10.5)
nRBC: 0 % (ref 0.0–0.2)

## 2018-11-15 LAB — MAGNESIUM: MAGNESIUM: 2.4 mg/dL (ref 1.7–2.4)

## 2018-11-15 MED ORDER — SPIRONOLACTONE 25 MG PO TABS: 25.0000 mg | ORAL_TABLET | Freq: Every day | ORAL | 1 refills | Status: DC

## 2018-11-15 NOTE — Progress Notes (Signed)
PCP: Cardiologist: Dr Marlou Porch  Primary HF Cardiologist: Dr [name] CT Surgeon: Dr Roxy Manns Oncologist: Dr [name]   HPI: Glorene Leitzke is a 57 y.o. female with a history of PAF,preveious smoker quit 4 years ago, hypertension, 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   Admitted 02/03/2018 with NSTEMI and shock. Underwent emergent cath 02/03/18 showed LAD 100% stenosed, LCx 95% stenosed. Taken for emergent CABG 02/03/18. Required impella post op. Hospital course complicated by cardiogenic shock, HCAP, A fib, respiratory failure, and swallowing issues. She was discharged to SNF. Discharge weight 103 pounds.   Admitted 5/10-5/16/19 from HF clinic with acute respiratory failure and concern for low output HF. She had been off medications PTA due to inability to afford them after leaving SNF. She required BiPAP on admit. CXR showed R pleural effusion. Had thoracentesis with cytology negative for malignancy. Co-ox was 65%. HF meds gradually added back. Required O2 at discharge. Discharged to SNF. DC weight: 101 lbs.   Admitted with increased dyspnea 5/25 through 05/10/2018. Recurrent respiratory failure. Underwent thoracentesis. Completed antibioitc course. Discharged to home.   She went to [facility] in [place] twice with SOB. Lasix was increased from 20 mg to 40 mg daily the first visit. Most recently, she was admitted in [place] 7/19-7/22/19 and required IV lasix. Lasix was increased to 40 mg BID at discharge. (per patient, no records available in epic)  Recently returned to [facility] after a long stay in [place]. She states she was in and out of the hospital her entire stay there due to recurrent pleural effusions. She had a pleurx cath at one point, and pleurodesis performed 09/09/18. Over this time, she spent about 6 weeks total between [facility] and [facility].   She presents today for post  hospital follow up. Apart from above, she just left the hospital in [place] this past Sunday due to CHF and SOB. She states she was given NTG. She is not a great historian concerning this admission, and no information available. She was in the hospital for 3 days.  She is also now s/p Medtronic device (unclear on on the specifics and doesn't have a card). Breathing has been stable since leaving the hospital. She denies dizziness or lightheadedness. She had been switched from lasix to torsemide earlier this year, and feels like she does better on that. Hasn't needed any O2 at home (uses prn). Weight stable around 100 lbs at home. Taking all medications as directed, and family are helping her pay for her medicines. Back at living with her sister here in [place].   ICD interrogation: Medtronic, single chamber ICD. Optivol with recent elevation, but now back to baseline after admission over weekend. Pt activity ~ 1.5 hr daily. Personally reviewed. No VT/VF.  Echo 09/04/18 ([facility]) LVEF 20%, Moderate AI, Mild MR, Mild TR, Severe LAE, RV mildly decreased,   CT chest 09/23/18 ([facility]) 1. Slight increase in size of loculated right hydropneumothorax, status post thoracostomy tube removal. 2. Increased bilateral smooth septal thickening, groundglass, and new trace left pleural effusion, most likely representing pulmonary edema. 3. There are a few discrete solid and groundglass nodules bilaterally, some of which were present on the prior exam. This may be secondary to edema or superimposed infection/inflammation. Recommend attention on follow-up. 4. Right hilar consolidation with air bronchograms, partially obscured by adjacent fluid and partial atelectasis, likely related to radiation change and previously was only very mild FDG avidity on prior PET.  CT chest 03/27/18 1. No definite findings of locally recurrent tumor in the right lung. 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 Continued chest CT surveillance is advised in 3-6 months. 2. No thoracic adenopathy or other findings of metastatic disease in the chest. 3. Small to moderate dependent right pleural effusion, increased. 4. Stable mild cardiomegaly. 5. Mild patchy ground-glass opacities in both lungs are decreased in the interval, favor improved mild residual pulmonary edema. 6. New complete right middle lobe atelectasis.  ECHO 04/20/2018  Left ventricle: Basal function preserved remainder of ventrical serverely hypokinetic The cavity size was severely dilated. Wall thickness was normal. The estimated ejection fraction was 20%. Left ventricular diastolic function parameters were normal. - Aortic valve: There was mild to moderate regurgitation. - Mitral valve: There was mild regurgitation. - Atrial septum: No defect or patent foramen ovale was identified.  ECHO  03/31/2018  Left ventricle: Basal inferior and anterior function preserved   remainder of ventrical serverely hypokinetic with septal and   apical akinesis The cavity size was severely dilated. Wall   thickness was normal. The estimated ejection fraction was 25%.   Doppler parameters are consistent with both elevated ventricular   end-diastolic filling pressure and elevated left atrial filling   pressure. - Aortic valve: There was moderate regurgitation. - Mitral valve: There was mild regurgitation. - Left atrium: The atrium was mildly dilated. - Atrial septum: No defect or patent foramen ovale was identified.  ECHO 02/11/2018  - Left ventricle: The cavity size was normal. There was mild   concentric hypertrophy. Systolic function was severely reduced.   The estimated ejection fraction was in the range of 25% to 30%.   There is akinesis of the mid-apical anterior, lateral,   inferolateral, inferior, apical septal and apical myocardium.   There is severe hypokinesis of the basalanteroseptal  and   inferoseptal myocardium. Features are consistent with a   pseudonormal left ventricular filling pattern, with concomitant   abnormal relaxation and increased filling pressure (grade 2   diastolic dysfunction). Doppler parameters are consistent with   high ventricular filling pressure. - Aortic valve: There was severe regurgitation by pressure halftime   of 133 msec but visually only appears moderate. - Mitral valve: Calcified annulus. There was trivial regurgitation.  LHC 02/03/2018    Prox RCA to Mid RCA lesion is 25% stenosed.  Ost Ramus lesion is 70% stenosed.  Ost Cx to Prox Cx lesion is 50% stenosed.  Ost LAD to Prox LAD lesion is 100% stenosed.  LV end diastolic pressure is severely elevated.  Hemodynamic findings consistent with severe pulmonary hypertension.  Post intervention, there is a 100% residual stenosis.  Review of systems complete and found to be negative unless listed in HPI.    SH:  Social History   Socioeconomic History  . Marital status: Married    Spouse name: Not on file  . Number of children: Not on file  . Years of education: Not on file  . Highest education level: Not on file  Occupational History  . Not on file  Social Needs  . Financial resource strain: Not on file  . Food insecurity:    Worry: Not on file    Inability: Not on file  . Transportation needs:    Medical: Not on file    Non-medical: Not on file  Tobacco Use  . Smoking status: Former Smoker    Packs/day: 1.00    Years:  20.00    Pack years: 20.00    Types: Cigarettes    Last attempt to quit: 03/13/2014    Years since quitting: 4.6  . Smokeless tobacco: Never Used  Substance and Sexual Activity  . Alcohol use: Yes    Alcohol/week: 8.0 standard drinks    Types: 8 Glasses of wine per week  . Drug use: No  . Sexual activity: Never  Lifestyle  . Physical activity:    Days per week: Not on file    Minutes per session: Not on file  . Stress: Not on file    Relationships  . Social connections:    Talks on phone: Not on file    Gets together: Not on file    Attends religious service: Not on file    Active member of club or organization: Not on file    Attends meetings of clubs or organizations: Not on file    Relationship status: Not on file  . Intimate partner violence:    Fear of current or ex partner: Not on file    Emotionally abused: Not on file    Physically abused: Not on file    Forced sexual activity: Not on file  Other Topics Concern  . Not on file  Social History Narrative  . Not on file    FH:  Family History  Problem Relation Age of Onset  . Heart disease Mother   . Hypertension Mother   . Heart attack Mother   . Hypertension Maternal Grandmother   . Cancer Maternal Grandmother   . Diabetes Paternal Grandmother   . Stroke Neg Hx     Past Medical History:  Diagnosis Date  . Acute systolic congestive heart failure ([place]) 02/03/2018  . Allergy   . Anxiety   . Asthma   . Hypertension   . PAF (paroxysmal atrial fibrillation) ([place])   . Prophylactic measure 08/03/14-08/19/14   Prophyl. cranial radiation 24 Gy  . 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 Small cell lung cancer ([place]) 03/16/2014    Current Outpatient Medications  Medication Sig Dispense Refill  . aspirin 81 MG EC tablet Take 1 tablet (81 mg total) by mouth daily. 30 tablet 1  . atorvastatin (LIPITOR) 80 MG tablet Take 1 tablet (80 mg total) by mouth daily at 6 PM. 30 tablet 1  . clopidogrel (PLAVIX) 75 MG tablet Take 1 tablet (75 mg total) by mouth daily. 30 tablet 1  . ipratropium-albuterol (DUONEB) 0.5-2.5 (3) MG/3ML SOLN Take 3 mLs by nebulization 2 (two) times daily. 360 mL 0  . ivabradine (CORLANOR) 7.5 MG TABS tablet Take 1 tablet (7.5 mg total) by mouth 2 (two) times daily with a meal. 60 tablet 1  . nitroGLYCERIN (NITROSTAT) 0.4  MG SL tablet Place 1 tablet (0.4 mg total) under the tongue every 5 (five) minutes as needed for chest pain. 20 tablet 0  . spironolactone (ALDACTONE) 25 MG tablet Take 0.5 tablets (12.5 mg total) by mouth daily. 30 tablet 1  . torsemide (DEMADEX) 20 MG tablet Take 60 mg by mouth daily.     No current facility-administered medications for this encounter.     Vitals:   11/15/18 1010  BP: 106/66  Pulse: 85  SpO2: 97%  Weight: 46.4 kg (102 lb 6.4 oz)   Wt Readings from Last 3 Encounters:  11/15/18 46.4 kg (102 lb 6.4 oz)  07/03/18 44.9 kg (99 lb)  05/30/18 43.9 kg (96 lb 12.8 oz)   PHYSICAL EXAM General: NAD  HEENT: Normal Neck: Supple. JVP 5-6. Carotids 2+ bilat; no bruits. No thyromegaly or nodule noted. Cor: PMI nondisplaced. RRR, No M/G/R noted Lungs: Upper lobes clear, Left lower sounds dull.  Abdomen: Soft, non-tender, non-distended, no HSM. No bruits or masses. +BS  Extremities: No cyanosis, clubbing, or rash. R and LLE no edema.  Neuro: Alert & orientedx3, cranial nerves grossly intact. moves all 4 extremities w/o difficulty. Affect pleasant   ASSESSMENT & PLAN: 1. Chronic Systolic HF  H/O cardiogenic shock in setting of #1: TEE 02/03/18 LVEF 20%, Mild to Mod AR, Mild/Mod MR, RV normal. Impella out 3/1.  - Echo 02/2018 EF 25-30% with moderate-severe aortic insufficiency which is likely contributing to CHF.  - Echo 04/2018: EF 20%, mild to mod AI, mild MR - Echo 09/04/18 ([facility]) LVEF 20%, Moderate AI, Mild MR, Mild TR, Severe LAE, RV mildly decreased.  - Now s/p Medtronic single-chamber ICD. - NYHA II-III symptoms chronically. - Volume status stable on exam on torsemide 60 mg daily.  - No BB with recent low output.  - She is off digoxin and losartan since last visit.  - Increase spiro to 25 mg daily. BMET today and 10 days. - Continue corlanor 7.5 mg BID. No BP room for medication changes.  - Recommend she reconnect with paramedicine now that she is back from [place]. Referral  sent. - With multiple admissions and deconditioning. Will refer to CR. She has trouble with transportation but wants to work on.   2. Hypoxic respiratory failure - Uses O2 PRN. No change.   3. Recurrent pleural effusions - Multiple admission this year in [place] and [place]. She had a Pleurx for a time, and underwent 'mechanical' pleurodesis 09/09/18. - Clear on exam today.  - CT chest 09/23/18 as above. This is the most recent imaging available. - Will check new baseline CXR today.   4. 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  Cath with severe 2v CAD as above with severe LV dysfunction/ICM. s/p Emergent CABG 02/03/18.   - No s/s of ischemia.    - Continue plavix, asa, atorvastatin.    5. PAF:  CHA2DS2/VASc is at least 4. (CHF, Vasc disease, HTN, Female).   - She has history of Afib RVR in the past in the setting of her Lung CA. Previously on Xarelto but stopped her medications.  - Will continue ASA and Plavix. No anticoagulation with h/o lung cancer. - Regular on exam.   6. 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  - Chest CT 02/19/18 with mass-like consolidation in R hilum concerning for recurrent tumor.  -Repeat CT 03/27/2018 -No definite findings of locally recurrent tumor in the right lung. 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 Continued chest CT surveillance is advised in 3-6 months. - S/p thoracentesis 04/2018. Cytology negative for malignancy.  - CT chest 09/23/18 with slight increase in size of loculated right hydropneumothorax, status post thoracostomy tube removal. No evidence of tumor noted.   7. Anemia:  - Hgb 8.9 at [facility] 09/27/18. Repeat today.   8. Aortic insufficiency: Moderate-severe on postop echo.  Likely potentiating CHF.   - Moderate on Echo at [facility] 08/2018.   9. Dysphagia - Swallow study with concerns for RLN palsy during previous hospitaliazation.  Resolved.  10. Inability to afford medications - She feels financially stable now and has family helping her out. Recommend she resume paramedicine for closer follow up.   Shirley Friar, PA-C 10:20 AM  Patient seen and examined with the above-signed Advanced Practice Provider and/or Housestaff. I personally reviewed laboratory data, imaging studies and relevant notes. I independently examined the patient and formulated the important aspects of the plan. I have edited the note to reflect any of my changes or salient points. I have personally discussed the plan with the patient and/or family.  Very complex 57 y/o woman with CAD with severe ischemic CM EF 20% and moderate AI. Recent course c/b multiple admissions at various centers for recurrent HF and pleural effusions. Now s/p mechanical pleurodesis of left lung. Therapy also complicated by low BP and intolerance of HF meds.   Currently compensated but tenuous NYHA III-IIIB. Volume status ok. On exam JVP 6-7. Cor RRR with 2/6 AI Lungs dull at right base. No edema.  Will increase spiro and refer to CR. Will need to follow closely. Given size and comorbidities, I am not sure she would qualify for advanced therapies.   Glori Bickers, MD  6:52 PM

## 2018-11-15 NOTE — Patient Instructions (Signed)
Labs done today  INCREASE Spironolactone 25mg  (1 tab) daily  You have been referred to Cardiac Electrophysiology. Their office will call you to schedule an appointment.  You have been referred to Cardiac Rehab. They will call you to schedule your appointment.  A chest x-ray takes a picture of the organs and structures inside the chest, including the heart, lungs, and blood vessels. This test can show several things, including, whether the heart is enlarges; whether fluid is building up in the lungs; and whether pacemaker / defibrillator leads are still in place.  Follow up with Dr. [name] in 4 weeks

## 2018-11-19 ENCOUNTER — Other Ambulatory Visit (HOSPITAL_COMMUNITY): Payer: Self-pay

## 2018-11-19 NOTE — Progress Notes (Signed)
Telephone encounter: Pt called to schedule initial CHP visit. Pt is a re-referral.  She agrees on [place] @ 1230.

## 2018-11-21 ENCOUNTER — Other Ambulatory Visit (HOSPITAL_COMMUNITY): Payer: Self-pay

## 2018-11-21 NOTE — Progress Notes (Signed)
Paramedicine Encounter    Patient ID: Brittany [name], female    DOB: 1961-06-17, 57 y.o.   MRN: [id number]    Patient Care Team: Scifres, Durel Salts as PCP - General (Physician Assistant) [name], Shaune Pascal, MD as PCP - Cardiology (Cardiology) Jorge Ny, LCSW as Social Worker (Licensed Clinical Social Worker)  Patient Active Problem List   Diagnosis Date Noted  . Recurrent pleural effusion on right 05/05/2018  . Acute respiratory failure ([place]) 05/05/2018  . Dyspnea 04/19/2018  . Chronic respiratory failure with hypoxia ([place])   . Acute on chronic systolic CHF (congestive heart failure) ([place]) 02/03/2018  . S/P CABG (coronary artery bypass graft) 02/03/2018  . Asthma   . Anxiety   . Allergy   . HCAP (healthcare-associated pneumonia) 03/15/2015  . Chest pain 03/15/2015  . Small cell lung cancer ([place]) 03/16/2014  . Protein-calorie malnutrition, severe ([place]) 03/14/2014  . Atrial fibrillation ([place]) 03/12/2014  . HTN (hypertension) 05/07/2012    Current Outpatient Medications:  .  aspirin 81 MG EC tablet, Take 1 tablet (81 mg total) by mouth daily., Disp: 30 tablet, Rfl: 1 .  atorvastatin (LIPITOR) 80 MG tablet, Take 1 tablet (80 mg total) by mouth daily at 6 PM., Disp: 30 tablet, Rfl: 1 .  clopidogrel (PLAVIX) 75 MG tablet, Take 1 tablet (75 mg total) by mouth daily., Disp: 30 tablet, Rfl: 1 .  ipratropium-albuterol (DUONEB) 0.5-2.5 (3) MG/3ML SOLN, Take 3 mLs by nebulization 2 (two) times daily., Disp: 360 mL, Rfl: 0 .  ivabradine (CORLANOR) 7.5 MG TABS tablet, Take 1 tablet (7.5 mg total) by mouth 2 (two) times daily with a meal., Disp: 60 tablet, Rfl: 1 .  nitroGLYCERIN (NITROSTAT) 0.4 MG SL tablet, Place 1 tablet (0.4 mg total) under the tongue every 5 (five) minutes as needed for chest pain., Disp: 20 tablet, Rfl: 0 .  spironolactone (ALDACTONE) 25 MG tablet, Take 1 tablet (25 mg total) by mouth daily., Disp: 30 tablet, Rfl: 1 .  torsemide (DEMADEX) 20 MG tablet,  Take 60 mg by mouth daily., Disp: , Rfl:  Allergies  Allergen Reactions  . Iodinated Diagnostic Agents Hives and Rash    Needs 13-hour prep before CT scans w/ contrast.  Can do Benadryl 50mg  PO for ESI's, etc  . Latex Hives and Other (See Comments)    Skin irritation  . Pineapple Hives  . Sulfa Antibiotics Hives and Rash  . Codeine Nausea And Vomiting     Social History   Socioeconomic History  . Marital status: Married    Spouse name: Not on file  . Number of children: Not on file  . Years of education: Not on file  . Highest education level: Not on file  Occupational History  . Not on file  Social Needs  . Financial resource strain: Not on file  . Food insecurity:    Worry: Not on file    Inability: Not on file  . Transportation needs:    Medical: Not on file    Non-medical: Not on file  Tobacco Use  . Smoking status: Former Smoker    Packs/day: 1.00    Years: 20.00    Pack years: 20.00    Types: Cigarettes    Last attempt to quit: 03/13/2014    Years since quitting: 4.6  . Smokeless tobacco: Never Used  Substance and Sexual Activity  . Alcohol use: Yes    Alcohol/week: 8.0 standard drinks    Types: 8 Glasses of wine per week  .  Drug use: No  . Sexual activity: Never  Lifestyle  . Physical activity:    Days per week: Not on file    Minutes per session: Not on file  . Stress: Not on file  Relationships  . Social connections:    Talks on phone: Not on file    Gets together: Not on file    Attends religious service: Not on file    Active member of club or organization: Not on file    Attends meetings of clubs or organizations: Not on file    Relationship status: Not on file  . Intimate partner violence:    Fear of current or ex partner: Not on file    Emotionally abused: Not on file    Physically abused: Not on file    Forced sexual activity: Not on file  Other Topics Concern  . Not on file  Social History Narrative  . Not on file    Physical  Exam Cardiovascular:     Rate and Rhythm: Normal rate.  Pulmonary:     Effort: Pulmonary effort is normal. No respiratory distress.     Breath sounds: No wheezing.  Abdominal:     General: There is no distension.  Musculoskeletal:        General: No swelling.  Skin:    General: Skin is warm and dry.         Future Appointments  Date Time Provider [place]  12/16/2018 10:30 AM Bensimhon, Shaune Pascal, MD [facility] None  04/07/2019  9:30 AM Rexene Alberts, MD [facility]     There were no vitals taken for this visit.  Initial visit (re-referral) ATF pt CAO x4.  Pt stated that she has had a lot of anxiety since all of the hospital stays that she's had since July.  She stated that she wakes up throughout the night because she "afraid of not waking up".  She stated that she's very "tired of going to the hospital and seeing doctors".  Pt is very hopeful now that she's back in Dr. Venia Minks care.  Pt is back living with her sister and has a good support system. She stated that the equipment (scale, bp cuff, and pulse oz) was picked up today.  Pt still has a oxygen tank but doesn't have a nasal cannula. She stated that she only need the oxygen at night for right now.    Pt has al of her medications and she still has access to health insurance. Pt was given a scale and nasal canula during this visit. She is waiting for feedback from social security which she is also stressed about.    Pt agrees to look for a pcp and therapist prior to our next visit.  She stated that she will start with cardiac rehab, she has limited transportation but is welling to use [facility] transportation.  Pt agrees to continue with CHP visits. CHP visit scheduled for next week.     Medication ordered: none  Chastelyn Athens, EMT Paramedic [phone] 11/22/2018    ACTION: Home visit completed

## 2018-11-22 ENCOUNTER — Telehealth (HOSPITAL_COMMUNITY): Payer: Self-pay

## 2018-11-22 NOTE — Telephone Encounter (Signed)
Pt called to confirm today's CHP visit.

## 2018-11-24 ENCOUNTER — Other Ambulatory Visit: Payer: Self-pay

## 2018-11-24 ENCOUNTER — Inpatient Hospital Stay (HOSPITAL_COMMUNITY)
Admission: EM | Admit: 2018-11-24 | Discharge: 2018-11-26 | DRG: 291 | Disposition: A | Discharge status: Home / Self Care | Payer: BLUE CROSS/BLUE SHIELD | Attending: Internal Medicine | Admitting: Internal Medicine

## 2018-11-24 ENCOUNTER — Encounter (HOSPITAL_COMMUNITY): Payer: Self-pay | Admitting: Emergency Medicine

## 2018-11-24 ENCOUNTER — Emergency Department (HOSPITAL_COMMUNITY): Payer: BLUE CROSS/BLUE SHIELD

## 2018-11-24 ENCOUNTER — Inpatient Hospital Stay (HOSPITAL_COMMUNITY): Payer: BLUE CROSS/BLUE SHIELD

## 2018-11-24 DIAGNOSIS — Z8249 Family history of ischemic heart disease and other diseases of the circulatory system: Secondary | ICD-10-CM

## 2018-11-24 DIAGNOSIS — I351 Nonrheumatic aortic (valve) insufficiency: Secondary | ICD-10-CM | POA: Diagnosis present

## 2018-11-24 DIAGNOSIS — Z9851 Tubal ligation status: Secondary | ICD-10-CM | POA: Diagnosis not present

## 2018-11-24 DIAGNOSIS — J9621 Acute and chronic respiratory failure with hypoxia: Secondary | ICD-10-CM | POA: Diagnosis present

## 2018-11-24 DIAGNOSIS — Z7982 Long term (current) use of aspirin: Secondary | ICD-10-CM | POA: Diagnosis not present

## 2018-11-24 DIAGNOSIS — C3490 Malignant neoplasm of unspecified part of unspecified bronchus or lung: Secondary | ICD-10-CM | POA: Diagnosis not present

## 2018-11-24 DIAGNOSIS — Z882 Allergy status to sulfonamides status: Secondary | ICD-10-CM

## 2018-11-24 DIAGNOSIS — Z9221 Personal history of antineoplastic chemotherapy: Secondary | ICD-10-CM

## 2018-11-24 DIAGNOSIS — J449 Chronic obstructive pulmonary disease, unspecified: Secondary | ICD-10-CM | POA: Diagnosis present

## 2018-11-24 DIAGNOSIS — J9611 Chronic respiratory failure with hypoxia: Secondary | ICD-10-CM | POA: Diagnosis present

## 2018-11-24 DIAGNOSIS — J90 Pleural effusion, not elsewhere classified: Secondary | ICD-10-CM | POA: Diagnosis present

## 2018-11-24 DIAGNOSIS — I2510 Atherosclerotic heart disease of native coronary artery without angina pectoris: Secondary | ICD-10-CM | POA: Diagnosis present

## 2018-11-24 DIAGNOSIS — I252 Old myocardial infarction: Secondary | ICD-10-CM

## 2018-11-24 DIAGNOSIS — I509 Heart failure, unspecified: Secondary | ICD-10-CM

## 2018-11-24 DIAGNOSIS — I255 Ischemic cardiomyopathy: Secondary | ICD-10-CM | POA: Diagnosis present

## 2018-11-24 DIAGNOSIS — I480 Paroxysmal atrial fibrillation: Secondary | ICD-10-CM | POA: Diagnosis present

## 2018-11-24 DIAGNOSIS — Z79899 Other long term (current) drug therapy: Secondary | ICD-10-CM

## 2018-11-24 DIAGNOSIS — Z885 Allergy status to narcotic agent status: Secondary | ICD-10-CM | POA: Diagnosis not present

## 2018-11-24 DIAGNOSIS — Z7902 Long term (current) use of antithrombotics/antiplatelets: Secondary | ICD-10-CM | POA: Diagnosis not present

## 2018-11-24 DIAGNOSIS — Z8701 Personal history of pneumonia (recurrent): Secondary | ICD-10-CM

## 2018-11-24 DIAGNOSIS — Z91041 Radiographic dye allergy status: Secondary | ICD-10-CM

## 2018-11-24 DIAGNOSIS — N179 Acute kidney failure, unspecified: Secondary | ICD-10-CM | POA: Diagnosis present

## 2018-11-24 DIAGNOSIS — Z9111 Patient's noncompliance with dietary regimen: Secondary | ICD-10-CM | POA: Diagnosis not present

## 2018-11-24 DIAGNOSIS — I110 Hypertensive heart disease with heart failure: Principal | ICD-10-CM | POA: Diagnosis present

## 2018-11-24 DIAGNOSIS — Z85118 Personal history of other malignant neoplasm of bronchus and lung: Secondary | ICD-10-CM | POA: Diagnosis not present

## 2018-11-24 DIAGNOSIS — Z87891 Personal history of nicotine dependence: Secondary | ICD-10-CM

## 2018-11-24 DIAGNOSIS — Z91018 Allergy to other foods: Secondary | ICD-10-CM

## 2018-11-24 DIAGNOSIS — Z951 Presence of aortocoronary bypass graft: Secondary | ICD-10-CM

## 2018-11-24 DIAGNOSIS — I5023 Acute on chronic systolic (congestive) heart failure: Secondary | ICD-10-CM | POA: Diagnosis present

## 2018-11-24 DIAGNOSIS — I4891 Unspecified atrial fibrillation: Secondary | ICD-10-CM | POA: Diagnosis present

## 2018-11-24 DIAGNOSIS — J9600 Acute respiratory failure, unspecified whether with hypoxia or hypercapnia: Secondary | ICD-10-CM | POA: Diagnosis present

## 2018-11-24 DIAGNOSIS — Z923 Personal history of irradiation: Secondary | ICD-10-CM | POA: Diagnosis not present

## 2018-11-24 DIAGNOSIS — Z91040 Latex allergy status: Secondary | ICD-10-CM | POA: Diagnosis not present

## 2018-11-24 DIAGNOSIS — J9601 Acute respiratory failure with hypoxia: Secondary | ICD-10-CM | POA: Diagnosis not present

## 2018-11-24 LAB — CBC
HCT: 42 % (ref 36.0–46.0)
Hemoglobin: 11.6 g/dL — ABNORMAL LOW (ref 12.0–15.0)
MCH: 25.8 pg — AB (ref 26.0–34.0)
MCHC: 27.6 g/dL — ABNORMAL LOW (ref 30.0–36.0)
MCV: 93.3 fL (ref 80.0–100.0)
NRBC: 0 % (ref 0.0–0.2)
Platelets: 237 10*3/uL (ref 150–400)
RBC: 4.5 MIL/uL (ref 3.87–5.11)
RDW: 20 % — AB (ref 11.5–15.5)
WBC: 8.2 10*3/uL (ref 4.0–10.5)

## 2018-11-24 LAB — BASIC METABOLIC PANEL
ANION GAP: 17 — AB (ref 5–15)
BUN: 12 mg/dL (ref 6–20)
CO2: 18 mmol/L — AB (ref 22–32)
Calcium: 9.5 mg/dL (ref 8.9–10.3)
Chloride: 106 mmol/L (ref 98–111)
Creatinine, Ser: 0.92 mg/dL (ref 0.44–1.00)
GFR calc Af Amer: >60 mL/min (ref >60)
GLUCOSE: 251 mg/dL — AB (ref 70–99)
Potassium: 3.6 mmol/L (ref 3.5–5.1)
Sodium: 141 mmol/L (ref 135–145)

## 2018-11-24 LAB — I-STAT TROPONIN, ED: Troponin i, poc: 0.02 ng/mL (ref 0.00–0.08)

## 2018-11-24 LAB — CBG MONITORING, ED: Glucose-Capillary: 155 mg/dL — ABNORMAL HIGH (ref 70–99)

## 2018-11-24 LAB — TROPONIN I: TROPONIN I: 0.03 ng/mL — AB (ref <0.03)

## 2018-11-24 LAB — MAGNESIUM: MAGNESIUM: 3.8 mg/dL — AB (ref 1.7–2.4)

## 2018-11-24 LAB — BRAIN NATRIURETIC PEPTIDE: B NATRIURETIC PEPTIDE 5: 2479.2 pg/mL — AB (ref 0.0–100.0)

## 2018-11-24 MED ORDER — SODIUM CHLORIDE 0.9% FLUSH
3.0000 mL | Freq: Two times a day (BID) | INTRAVENOUS | Status: DC
  Administered 2018-11-24: 3 mL via INTRAVENOUS

## 2018-11-24 MED ORDER — SPIRONOLACTONE 25 MG PO TABS
25.0000 mg | ORAL_TABLET | Freq: Every day | ORAL | Status: DC
  Administered 2018-11-24 – 2018-11-26 (×3): 25 mg via ORAL
  Filled 2018-11-24 (×3): qty 1

## 2018-11-24 MED ORDER — SODIUM CHLORIDE 0.9% FLUSH: 3.0000 mL | INTRAVENOUS | Status: DC | PRN

## 2018-11-24 MED ORDER — LIDOCAINE-EPINEPHRINE 1 %-1:100000 IJ SOLN
INTRAMUSCULAR | Status: AC
  Filled 2018-11-24: qty 1

## 2018-11-24 MED ORDER — ASPIRIN EC 81 MG PO TBEC
81.0000 mg | DELAYED_RELEASE_TABLET | Freq: Every day | ORAL | Status: DC
  Administered 2018-11-24 – 2018-11-26 (×3): 81 mg via ORAL
  Filled 2018-11-24 (×4): qty 1

## 2018-11-24 MED ORDER — TORSEMIDE 20 MG PO TABS: 60.0000 mg | ORAL_TABLET | Freq: Every day | ORAL | Status: DC

## 2018-11-24 MED ORDER — IPRATROPIUM-ALBUTEROL 0.5-2.5 (3) MG/3ML IN SOLN
3.0000 mL | Freq: Two times a day (BID) | RESPIRATORY_TRACT | Status: DC
  Administered 2018-11-24 – 2018-11-26 (×5): 3 mL via RESPIRATORY_TRACT
  Filled 2018-11-24 (×5): qty 3

## 2018-11-24 MED ORDER — INSULIN ASPART 100 UNIT/ML ~~LOC~~ SOLN
0.0000 [IU] | SUBCUTANEOUS | Status: DC
  Administered 2018-11-24 (×3): 2 [IU] via SUBCUTANEOUS
  Administered 2018-11-25 – 2018-11-26 (×5): 1 [IU] via SUBCUTANEOUS

## 2018-11-24 MED ORDER — ENOXAPARIN SODIUM 40 MG/0.4ML ~~LOC~~ SOLN
40.0000 mg | SUBCUTANEOUS | Status: DC
  Administered 2018-11-24 – 2018-11-25 (×2): 40 mg via SUBCUTANEOUS
  Filled 2018-11-24 (×3): qty 0.4

## 2018-11-24 MED ORDER — SODIUM CHLORIDE 0.9 % IV SOLN: 250.0000 mL | INTRAVENOUS | Status: DC | PRN

## 2018-11-24 MED ORDER — FUROSEMIDE 10 MG/ML IJ SOLN
20.0000 mg | Freq: Once | INTRAMUSCULAR | Status: AC
  Administered 2018-11-24: 20 mg via INTRAVENOUS
  Filled 2018-11-24: qty 2

## 2018-11-24 MED ORDER — FUROSEMIDE 10 MG/ML IJ SOLN
60.0000 mg | Freq: Two times a day (BID) | INTRAMUSCULAR | Status: DC
  Administered 2018-11-24: 60 mg via INTRAVENOUS
  Filled 2018-11-24: qty 6

## 2018-11-24 MED ORDER — ATORVASTATIN CALCIUM 80 MG PO TABS
80.0000 mg | ORAL_TABLET | Freq: Every day | ORAL | Status: DC
  Administered 2018-11-24 – 2018-11-25 (×2): 80 mg via ORAL
  Filled 2018-11-24 (×2): qty 1

## 2018-11-24 MED ORDER — ALPRAZOLAM 0.25 MG PO TABS
0.2500 mg | ORAL_TABLET | Freq: Three times a day (TID) | ORAL | Status: DC | PRN
  Administered 2018-11-24 – 2018-11-26 (×4): 0.25 mg via ORAL
  Filled 2018-11-24 (×4): qty 1

## 2018-11-24 MED ORDER — CLOPIDOGREL BISULFATE 75 MG PO TABS
75.0000 mg | ORAL_TABLET | Freq: Every day | ORAL | Status: DC
  Administered 2018-11-24 – 2018-11-26 (×3): 75 mg via ORAL
  Filled 2018-11-24 (×4): qty 1

## 2018-11-24 MED ORDER — ONDANSETRON HCL 4 MG/2ML IJ SOLN: 4.0000 mg | Freq: Four times a day (QID) | INTRAMUSCULAR | Status: DC | PRN

## 2018-11-24 MED ORDER — FUROSEMIDE 10 MG/ML IJ SOLN
60.0000 mg | Freq: Once | INTRAMUSCULAR | Status: AC
  Administered 2018-11-24: 60 mg via INTRAVENOUS
  Filled 2018-11-24: qty 6

## 2018-11-24 MED ORDER — IVABRADINE HCL 7.5 MG PO TABS
7.5000 mg | ORAL_TABLET | Freq: Two times a day (BID) | ORAL | Status: DC
  Administered 2018-11-24 – 2018-11-26 (×4): 7.5 mg via ORAL
  Filled 2018-11-24 (×4): qty 1

## 2018-11-24 MED ORDER — POTASSIUM CHLORIDE CRYS ER 20 MEQ PO TBCR: 40.0000 meq | EXTENDED_RELEASE_TABLET | Freq: Once | ORAL | Status: DC

## 2018-11-24 MED ORDER — ACETAMINOPHEN 325 MG PO TABS: 650.0000 mg | ORAL_TABLET | ORAL | Status: DC | PRN

## 2018-11-24 NOTE — Consult Note (Addendum)
Cardiology Consultation:   Patient ID: Brittany [name]; [id number]; [birth year]   Admit date: 11/24/2018 Date of Consult: 11/24/2018  Primary Care Provider: Maude Leriche, PA-C Primary Cardiologist: Dr. [name], MD  Patient Profile:   Brittany [name] is a 57 y.o. female with a hx of PAF,chronic systolic HF s/p Medtronic single-chamber ICD with known EF of 20%, previous smoker quit 4 years ago, hypertension, previous small cell lung cancer treated with chemo, chest XRT and prophylactic brain radiation in 2015 and family history of premature CAD who is being seen today for the evaluation of SOB with recurrent pleural effusions at the request of Dr. Alcario Drought.  History of Present Illness:   [name] is a 57 year old female with a history stated above who presented to [facility] on 11/24/2018 with complaints of progressively worsening shortness of breath over the last several days which was acutely worse overnight. Pt states that she was up watching television yesterday evening and was feeling ok. When she went to lay flat to go to sleep she was unable to catch her breath. Given her extensive and complicated last several months, she knew that this was related to "fluid in her lungs". She lived with her sister and called out for assistance however her sister did not hear her. She then called EMS for transportation to the emergency department for further evaluation. She was placed on Bipap ventilation while still at her house and was then transported.   In the ED, CXR showed recurrent, partially loculated right pleural effusion with progression over the 9 days since her last CXR during an office visit 11/18/2018.  BNP noted to be greater than 2400.  Troponin negative at 0.03 with no complaints of anginal pain.   Of note, the patient was last seen by her primary cardiologist, Dr. [name] on 11/15/2018 for hospital follow-up.  Per chart review, she had been in [place] visiting family for what was suppose to be 2 weeks (and ended up being 5 months secondary to multiple hospitalizations) and was unfortunately hospitalized and was treated for recurrent pleural effusions. She had a Medtronic device placed during one of her hospitalizations (unclear of the specifics and do not have a card). During her follow up visit, her weight was noted to have around 100 pounds and she was being managed on torsemide 60mg .  She reported taking all medications.  She appeared euvolemic on exam.  Her spironolactone was increased to 25 mg daily and she was continued on Corlin or 7.5 mg twice daily.  It was recommended that she participate in cardiac rehab however was having trouble with transportation.  Per chart review, she had initial hospitalization 02/03/2018 with an STEMI and shock.  She underwent emergent cardiac catheterization 02/03/2017 which revealed LAD 100% stenosed, LCx 95% stenosed.  She was taken for emergent CABG 02/03/2017.  She required Impella postoperatively.  Hospital course was complicated by cardiogenic shock, HCAP, A. fib and respiratory failure. She was discharged to SNF with a weight of 103 pounds.  Several months later, she was admitted again 04/19/2018 to 04/25/2017 from HF clinic with acute respiratory failure and concern for low output heart failure.  She had been off medications due to inability to afford them after leaving SNF.  She required BiPAP on admission.  Chest x-ray showed right pleural effusion and had a thoracentesis with cytology negative for malignancy.  Coags at that time was 65%.  HF meds were gradually added back in which she required O2 supplementation at discharge.  She  was again discharged to an SNF facility with a weight of 101 pounds.  She returned to [facility] after a long stay in [place] in which she stated that she was in and out of the hospital her entire stay secondary to recurrent pleural effusions.  She had a Pleurx cath at one point  and pleurodesis performed 09/09/2018.  Cardiology has been asked to evaluate given the return of her right pleural effusion. She was evaluated by IR with an US guided attempt to drain earlier today without success as report states that there was a small markedly loculated right-sided pleural effusion, not amenable to ultrasound-guided thoracentesis with recommendations for further evaluation with contrast-enhanced chest CT.   Past Medical History:  Diagnosis Date  . Acute systolic congestive heart failure ([place]) 02/03/2018  . Allergy   . Anxiety   . Asthma   . Hypertension   . PAF (paroxysmal atrial fibrillation) ([place])   . Prophylactic measure 08/03/14-08/19/14   Prophyl. cranial radiation 24 Gy  . 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 Small cell lung cancer ([place]) 03/16/2014    Past Surgical History:  Procedure Laterality Date  . CESAREAN SECTION    . CORONARY ARTERY BYPASS GRAFT N/A 02/03/2018   Procedure: CORONARY ARTERY BYPASS GRAFTING (CABG);  Surgeon: Rexene Alberts, MD;  Location: [place];  Service: Open Heart Surgery;  Laterality: N/A;  Time 3 using left internal mammary artery and endoscopically harvested right saphenous vein  . CORONARY BALLOON ANGIOPLASTY N/A 02/03/2018   Procedure: CORONARY BALLOON ANGIOPLASTY;  Surgeon: Martinique, Peter M, MD;  Location: [place] [facility];  Service: Cardiovascular;  Laterality: N/A;  . CORONARY/GRAFT ACUTE MI REVASCULARIZATION N/A 02/03/2018   Procedure: Coronary/Graft Acute MI Revascularization;  Surgeon: Martinique, Peter M, MD;  Location: [place] [facility];  Service: Cardiovascular;  Laterality: N/A;  . IABP INSERTION N/A 02/03/2018   Procedure: IABP Insertion;  Surgeon: Martinique, Peter M, MD;  Location: [place] [facility];  Service: Cardiovascular;  Laterality: N/A;  . INTRAOPERATIVE TRANSESOPHAGEAL ECHOCARDIOGRAM N/A 02/03/2018    Procedure: INTRAOPERATIVE TRANSESOPHAGEAL ECHOCARDIOGRAM;  Surgeon: Rexene Alberts, MD;  Location: [place];  Service: Open Heart Surgery;  Laterality: N/A;  . LEFT HEART CATH AND CORONARY ANGIOGRAPHY N/A 02/03/2018   Procedure: LEFT HEART CATH AND CORONARY ANGIOGRAPHY;  Surgeon: Martinique, Peter M, MD;  Location: [place] [facility];  Service: Cardiovascular;  Laterality: N/A;  . MEDIASTINOSCOPY N/A 03/11/2014   Procedure: MEDIASTINOSCOPY;  Surgeon: Melrose Nakayama, MD;  Location: [place];  Service: Thoracic;  Laterality: N/A;  . PLACEMENT OF [place] LEFT VENTRICULAR ASSIST DEVICE  02/03/2018   Procedure: PLACEMENT OF [place] LEFT VENTRICULAR ASSIST DEVICE LD;  Surgeon: Rexene Alberts, MD;  Location: [place];  Service: Open Heart Surgery;;  . REMOVAL OF IMPELLA LEFT VENTRICULAR ASSIST DEVICE N/A 02/08/2018   Procedure: REMOVAL OF [place] LEFT VENTRICULAR ASSIST DEVICE;  Surgeon: Rexene Alberts, MD;  Location: [place];  Service: Open Heart Surgery;  Laterality: N/A;  . RIGHT HEART CATH N/A 02/03/2018   Procedure: RIGHT HEART CATH;  Surgeon: Martinique, Peter M, MD;  Location: [place] [facility];  Service: Cardiovascular;  Laterality: N/A;  . RIGHT HEART CATH N/A 05/09/2018   Procedure: RIGHT HEART CATH;  Surgeon: Jolaine Artist, MD;  Location: [place] [facility];  Service: Cardiovascular;  Laterality: N/A;  .  TEE WITHOUT CARDIOVERSION N/A 02/08/2018   Procedure: TRANSESOPHAGEAL ECHOCARDIOGRAM (TEE);  Surgeon: Rexene Alberts, MD;  Location: [place];  Service: Open Heart Surgery;  Laterality: N/A;  . TUBAL LIGATION    . VIDEO BRONCHOSCOPY WITH ENDOBRONCHIAL ULTRASOUND N/A 03/11/2014   Procedure: VIDEO BRONCHOSCOPY WITH ENDOBRONCHIAL ULTRASOUND;  Surgeon: Melrose Nakayama, MD;  Location: [place];  Service: Thoracic;  Laterality: N/A;     Prior to Admission medications   Medication Sig Start Date End Date Taking? Authorizing Provider  ALPRAZolam (XANAX) 0.25 MG tablet Take 0.25 mg by mouth every 8 (eight)  hours as needed. for anxiety 11/10/18  Yes [provider]  aspirin 81 MG EC tablet Take 1 tablet (81 mg total) by mouth daily. 07/03/18  Yes Bensimhon, Shaune Pascal, MD  atorvastatin (LIPITOR) 80 MG tablet Take 1 tablet (80 mg total) by mouth daily at 6 PM. 07/03/18  Yes Bensimhon, Shaune Pascal, MD  clopidogrel (PLAVIX) 75 MG tablet Take 1 tablet (75 mg total) by mouth daily. 07/03/18  Yes Bensimhon, Shaune Pascal, MD  ipratropium-albuterol (DUONEB) 0.5-2.5 (3) MG/3ML SOLN Take 3 mLs by nebulization 2 (two) times daily. 05/23/18  Yes Bensimhon, Shaune Pascal, MD  ivabradine (CORLANOR) 7.5 MG TABS tablet Take 1 tablet (7.5 mg total) by mouth 2 (two) times daily with a meal. Patient taking differently: Take 11.25 mg by mouth 2 (two) times daily with a meal.  07/03/18  Yes Bensimhon, Shaune Pascal, MD  nitroGLYCERIN (NITROSTAT) 0.4 MG SL tablet Place 1 tablet (0.4 mg total) under the tongue every 5 (five) minutes as needed for chest pain. 05/23/18  Yes Bensimhon, Shaune Pascal, MD  spironolactone (ALDACTONE) 25 MG tablet Take 1 tablet (25 mg total) by mouth daily. 11/15/18  Yes Bensimhon, Shaune Pascal, MD  torsemide (DEMADEX) 20 MG tablet Take 60 mg by mouth daily.   Yes [provider]  oxyCODONE (OXY IR/ROXICODONE) 5 MG immediate release tablet Take 5 mg by mouth every 6 (six) hours as needed. for pain 10/04/18   [provider]    Inpatient Medications: Scheduled Meds: . aspirin EC  81 mg Oral Daily  . atorvastatin  80 mg Oral q1800  . clopidogrel  75 mg Oral Daily  . enoxaparin (LOVENOX) injection  40 mg Subcutaneous Q24H  . furosemide  60 mg Intravenous BID  . insulin aspart  0-9 Units Subcutaneous Q4H  . ipratropium-albuterol  3 mL Nebulization BID  . ivabradine  7.5 mg Oral BID WC  . lidocaine-EPINEPHrine      . potassium chloride  40 mEq Oral Once  . sodium chloride flush  3 mL Intravenous Q12H  . spironolactone  25 mg Oral Daily   Continuous Infusions: . sodium chloride     PRN Meds: sodium  chloride, acetaminophen, ondansetron (ZOFRAN) IV, sodium chloride flush  Allergies:    Allergies  Allergen Reactions  . Codeine Nausea And Vomiting    Social History:   Social History   Socioeconomic History  . Marital status: Married    Spouse name: Not on file  . Number of children: Not on file  . Years of education: Not on file  . Highest education level: Not on file  Occupational History  . Not on file  Social Needs  . Financial resource strain: Not on file  . Food insecurity:    Worry: Not on file    Inability: Not on file  . Transportation needs:    Medical: Not on file    Non-medical: Not  on file  Tobacco Use  . Smoking status: Former Smoker    Packs/day: 1.00    Years: 20.00    Pack years: 20.00    Types: Cigarettes    Last attempt to quit: 03/13/2014    Years since quitting: 4.7  . Smokeless tobacco: Never Used  Substance and Sexual Activity  . Alcohol use: Yes    Alcohol/week: 8.0 standard drinks    Types: 8 Glasses of wine per week  . Drug use: No  . Sexual activity: Never  Lifestyle  . Physical activity:    Days per week: Not on file    Minutes per session: Not on file  . Stress: Not on file  Relationships  . Social connections:    Talks on phone: Not on file    Gets together: Not on file    Attends religious service: Not on file    Active member of club or organization: Not on file    Attends meetings of clubs or organizations: Not on file    Relationship status: Not on file  . Intimate partner violence:    Fear of current or ex partner: Not on file    Emotionally abused: Not on file    Physically abused: Not on file    Forced sexual activity: Not on file  Other Topics Concern  . Not on file  Social History Narrative  . Not on file    Family History:   Family History  Problem Relation Age of Onset  . Heart disease Mother   . Hypertension Mother   . Heart attack Mother   . Hypertension Maternal Grandmother   . Cancer Maternal  Grandmother   . Diabetes Paternal Grandmother   . Stroke Neg Hx    Family Status:  Family Status  Relation Name Status  . Mother  Deceased  . Father  Deceased  . MGM  Deceased  . MGF  Deceased  . PGM  Deceased  . PGF  Deceased  . Neg Hx  (Not Specified)    ROS:  Please see the history of present illness.  All other ROS reviewed and negative.     Physical Exam/Data:   Vitals:   11/24/18 1000 11/24/18 1030 11/24/18 1045 11/24/18 1200  BP: 114/72 118/77 118/73 108/66  Pulse: 77 77 76 77  Resp: (!) 23 17 19 19   Temp:      TempSrc:      SpO2: 100% 100% 98% 100%  Weight:      Height:        Intake/Output Summary (Last 24 hours) at 11/24/2018 1231 Last data filed at 11/24/2018 1031 Gross per 24 hour  Intake -  Output 550 ml  Net -550 ml   Filed Weights   11/24/18 0335 11/24/18 0420  Weight: 46.4 kg 46.4 kg   Body mass index is 20.32 kg/m.   General: Frail, tired appearing, NAD Skin: Warm, dry, intact  Head: Normocephalic, atraumatic, clear, moist mucus membranes. Neck: Negative for carotid bruits. No JVD Lungs: Right and left rhonchi present with labored breathing  Cardiovascular: RRR with S1 S2. No murmurs, rubs, gallops, or LV heave appreciated. Abdomen: Soft, non-tender, non-distended with normoactive bowel sounds. No obvious abdominal masses. MSK: Strength and tone appear normal for age. 5/5 in all extremities Extremities: No edema. No clubbing or cyanosis. DP/PT pulses 2+ bilaterally Neuro: Alert and oriented. No focal deficits. No facial asymmetry. MAE spontaneously. Psych: Responds to questions appropriately with normal affect.  EKG:  The EKG was personally reviewed and demonstrates: 11/24/18 NSR  Telemetry:  Telemetry was personally reviewed and demonstrates: 11/24/18 NSR   Relevant CV Studies:  ICD interrogation: Medtronic, single chamber ICD. Optivol with recent elevation, but now back to baseline after admission over weekend. Pt activity ~ 1.5  hr daily. Personally reviewed. No VT/VF.  Echo 09/04/18 ([facility]) LVEF 20%, Moderate AI, Mild MR, Mild TR, Severe LAE, RV mildly decreased,   CT chest 09/23/18 ([facility]) 1. Slight increase in size of loculated right hydropneumothorax, status post thoracostomy tube removal. 2. Increased bilateral smooth septal thickening, groundglass, and new trace left pleural effusion, most likely representing pulmonary edema. 3. There are a few discrete solid and groundglass nodules bilaterally, some of which were present on the prior exam. This may be secondary to edema or superimposed infection/inflammation. Recommend attention on follow-up. 4. Right hilar consolidation with air bronchograms, partially obscured by adjacent fluid and partial atelectasis, likely related to radiation change and previously was only very mild FDG avidity on prior PET.   CT chest 03/27/18 1. No definite findings of locally recurrent tumor in the right lung. 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 Continued chest CT surveillance is advised in 3-6 months. 2. No thoracic adenopathy or other findings of metastatic disease in the chest. 3. Small to moderate dependent right pleural effusion, increased. 4. Stable mild cardiomegaly. 5. Mild patchy ground-glass opacities in both lungs are decreased in the interval, favor improved mild residual pulmonary edema. 6. New complete right middle lobe atelectasis.  ECHO 04/20/2018 Left ventricle: Basal function preserved remainder of ventrical serverely hypokinetic The cavity size was severely dilated. Wall thickness was normal. The estimated ejection fraction was 20%. Left ventricular diastolic function parameters were normal. - Aortic valve: There was mild to moderate regurgitation. - Mitral valve: There was mild regurgitation. - Atrial septum: No defect or patent foramen ovale was identified.  ECHO   03/31/2018  Left ventricle: Basal inferior and anterior function preserved remainder of ventrical serverely hypokinetic with septal and apical akinesis The cavity size was severely dilated. Wall thickness was normal. The estimated ejection fraction was 25%. Doppler parameters are consistent with both elevated ventricular end-diastolic filling pressure and elevated left atrial filling pressure. - Aortic valve: There was moderate regurgitation. - Mitral valve: There was mild regurgitation. - Left atrium: The atrium was mildly dilated. - Atrial septum: No defect or patent foramen ovale was identified.  ECHO 02/11/2018  - Left ventricle: The cavity size was normal. There was mild concentric hypertrophy. Systolic function was severely reduced. The estimated ejection fraction was in the range of 25% to 30%. There is akinesis of the mid-apical anterior, lateral, inferolateral, inferior, apical septal and apical myocardium. There is severe hypokinesis of the basalanteroseptal and inferoseptal myocardium. Features are consistent with a pseudonormal left ventricular filling pattern, with concomitant abnormal relaxation and increased filling pressure (grade 2 diastolic dysfunction). Doppler parameters are consistent with high ventricular filling pressure. - Aortic valve: There was severe regurgitation by pressure halftime of 133 msec but visually only appears moderate. - Mitral valve: Calcified annulus. There was trivial regurgitation.  LHC 02/03/2018    Prox RCA to Mid RCA lesion is 25% stenosed.  Ost Ramus lesion is 70% stenosed.  Ost Cx to Prox Cx lesion is 50% stenosed.  Ost LAD to Prox LAD lesion is 100% stenosed.  LV end diastolic pressure is severely elevated.  Hemodynamic findings consistent with severe pulmonary hypertension.  Post intervention, there is a 100% residual stenosis.  Laboratory Data:  Chemistry Recent Labs  Lab  11/24/18 0345  NA 141  K 3.6  CL 106  CO2 18*  GLUCOSE 251*  BUN 12  CREATININE 0.92  CALCIUM 9.5  GFRNONAA >60  GFRAA >60  ANIONGAP 17*    Total Protein  Date Value Ref Range Status  11/15/2018 7.2 6.5 - 8.1 g/dL Final  12/23/2014 7.1 6.4 - 8.3 g/dL Final   Albumin  Date Value Ref Range Status  11/15/2018 4.1 3.5 - 5.0 g/dL Final  12/23/2014 4.2 3.5 - 5.0 g/dL Final   AST  Date Value Ref Range Status  11/15/2018 27 15 - 41 U/L Final  12/23/2014 20 5 - 34 U/L Final   ALT  Date Value Ref Range Status  11/15/2018 17 0 - 44 U/L Final  12/23/2014 20 0 - 55 U/L Final   Alkaline Phosphatase  Date Value Ref Range Status  11/15/2018 76 38 - 126 U/L Final  12/23/2014 98 40 - 150 U/L Final   Total Bilirubin  Date Value Ref Range Status  11/15/2018 0.9 0.3 - 1.2 mg/dL Final  12/23/2014 0.69 0.20 - 1.20 mg/dL Final   Hematology Recent Labs  Lab 11/24/18 0345  WBC 8.2  RBC 4.50  HGB 11.6*  HCT 42.0  MCV 93.3  MCH 25.8*  MCHC 27.6*  RDW 20.0*  PLT 237   Cardiac Enzymes Recent Labs  Lab 11/24/18 0345  TROPONINI 0.03*    Recent Labs  Lab 11/24/18 0343  TROPIPOC 0.02    BNP Recent Labs  Lab 11/24/18 0345  BNP 2,479.2*    DDimer No results for input(s): DDIMER in the last 168 hours. TSH:  Lab Results  Component Value Date   TSH 0.766 04/20/2018   Lipids: Lab Results  Component Value Date   CHOL 279 (H) 01/01/2014   HDL 49 01/01/2014   LDLCALC 208 (H) 01/01/2014   TRIG 109 01/01/2014   CHOLHDL 5.7 01/01/2014   HgbA1c: Lab Results  Component Value Date   HGBA1C 5.8 (H) 04/22/2018    Radiology/Studies:  [place] Chest (pleural Effusion)  Result Date: 11/24/2018 CLINICAL DATA:  Patient with complex past medical history significant for CHF requiring emergent CABG (02/03/2018 also was remote history of small cell right-sided lung cancer in March 2015 with recurrent symptomatic pleural effusions ultimately requiring right-sided chest tube and  reported right-sided presumed talc pleurodesis at outside institution, most recently treated at [facility]). Patient now presents for attempted ultrasound-guided thoracentesis. EXAM: CHEST ULTRASOUND COMPARISON:  Chest radiograph-11/24/2018; 11/15/2018; 05/10/2018; ultrasound-guided right-sided thoracentesis-05/05/2018 FINDINGS: Sonographic evaluation performed by the dictating interventional radiologist demonstrates small markedly loculated right-sided pleural effusion with interposed pulmonary parenchyma not amenable to ultrasound-guided thoracentesis. IMPRESSION: Small markedly loculated right-sided pleural effusion, not amenable to ultrasound-guided thoracentesis. Further evaluation with contrast-enhanced chest CT could as indicated. Electronically Signed   By: Sandi Mariscal M.D.   On: 11/24/2018 10:01   Dg Chest Portable 1 View  Result Date: 11/24/2018 CLINICAL DATA:  Shortness of breath. EXAM: PORTABLE CHEST 1 VIEW COMPARISON:  Radiograph 11/15/2018. Most recent CT 04/20/2018 FINDINGS: Progression of right pleural effusion since prior exam, now partially loculated. Left-sided pacemaker in place. Post median sternotomy. Cardiomegaly is unchanged. Unchanged mediastinal contours with post treatment related changes at the right hilum. Suspect mild interstitial edema. Small left pleural effusion. No pneumothorax. IMPRESSION: 1. Progression of right pleural effusion over the past 9 days, now partially loculated. 2. Suspect mild  interstitial edema. Small left pleural effusion. Electronically Signed   By: Keith Rake M.D.   On: 11/24/2018 04:53    Assessment and Plan:   1.  Acute on chronic systolic CHF exacerbation with recurrent right pleural effusions: -Patient with a known LVEF of 20%, moderate AI, mild MR, mild TR, severe LAE, RV mildly decreased who is now s/p Medtronic single-chamber ICD device, followed by Dr. [name] (last seen 11/15/2018) who presented with acute onset of  SOB yesterday evening found to have progression of right pleural effusion from CXR completed 11/15/18 -IR attempted to drain effusion under US guidance however this was unsuccessful.  -Hs know hx of recurrent pleural effusions in which she has had negative cytology reports on several occasions. See #2  -Can continue with IV Lasix 60mg  BID and if stable tomorrow, resume home dose Torsemide 60mg  -Creatinine and K+ stable today and will resume Arlyce Harman 25mg   -No BB with recent low output -Continued on Corlanor 7.5 mg twice daily with no room for titration secondary to BP at last office visit  -Would recommend follow up chest CT with contrast and have TCTS to consult for further assitance -CHF team to follow starting 11/25/18   2.  Recurrent pleural effusions: -Patient has had multiple admissions in [place] and [place].  -Pleurx for a period of time and underwent a mechanical pleurodesis 09/09/2018 -CXR on 11/15/18 with right pleural effusion, however this has progressed from that time per CXR 11/24/18  3.  History of NSTEMI s/p emergent CABG 02/03/2017: -Cardiac catheterization 02/03/18 with severe two-vessel CAD as above with severe LV dysfunction and ischemic cardiomyopathy status post emergent CABG 02/03/2018 -No complaints of anginal symptoms -Continue Plavix, ASA, atorvastatin  4.  PAF: -History of atrial fibrillation with RVR in the past in the setting of lung CA, previously on Xarelto however this is been stopped -Continue ASA and Plavix, no anticoagulation with history of lung cancer -CHA2DS2VASc = 4 (CHF, vascular disease, hypertension and female)  5.  History of lung CA: -s/p treatment with chemo and radiation 2015 -Chest CT 02/19/2018 with masslike consolidation right hilum concerning for recurrent tumor with repeat CT 03/27/2018 with no definite findings of local recurrent tumor in the right lung -s/p thoracentesis 04/2018 with negative cytology for malignancy   For questions  or updates, please contact [facility] Please consult [email or web address] for contact info under Cardiology/STEMI.   Lyndel Safe NP-C [facility] Pager: [phone] 11/24/2018 12:31 PM ---------------------------------------------------------------------------------------------   History and all data above reviewed.  Patient examined.  I agree with the findings as above.  Please see the detailed,excellent history documented by Tonny Branch above.  Nikeisha Klutz is a pleasant 57 year old female with systolic HF and recurrent pleural effusions.  She mentions that she is quite tired of being in the hospital, but feels slightly better currently than she did on presentation.  She describes a sense of lying flat and becoming acutely short of breath, which is how she knows she is having trouble.  She describes prolonged hospitalizations, and that she is tired.  She is recently seen her cardiologist Dr. [name]  Constitutional: No acute distress, thin, fatigued Eyes: pupils equally round and reactive to light, sclera non-icteric, normal conjunctiva and lids ENMT: normal dentition, moist mucous membranes Cardiovascular: regular rhythm, normal rate, no murmurs. S1 and S2 normal. Radial pulses normal bilaterally. JVP just above clavicle.  Respiratory: decreased breath sounds rlb GI : normal bowel sounds, soft and nontender. No distention.   MSK: extremities warm,  well perfused. No edema.  NEURO: grossly nonfocal exam, moves all extremities. PSYCH: alert and oriented x 3, normal mood and affect.   All available labs, radiology testing, previous records reviewed. Agree with documented assessment and plan of my colleague as stated above with the following additions or changes:  Principal Problem:   Acute on chronic systolic CHF (congestive heart failure) ([place]) Active Problems:   Atrial fibrillation ([place])   Small cell lung cancer ([place])   S/P CABG (coronary artery bypass graft)    Chronic respiratory failure with hypoxia (HCC)   Recurrent pleural effusion on right   Acute respiratory failure (HCC)   CHF (congestive heart failure) ([place])    Plan: [name] has had difficulty with recurrent pleural effusion. We would recommend involving thoracic surgery for any options for palliation of the recurrent effusions.   She can be transitioned to her home torsemide 60 mg daily tomorrow, she feels slightly better with diuresis thus far. Resume spironolactone.  We discussed at length her goals for her care. She endorses being tired and wanting to avoid long hospital stays. With involvement of the Heart Failure hospital service tomorrow, I would recommend consulting palliative care for symptom management. The patient agrees that she would be interested in this.   Length of Stay:  LOS: 0 days   Elouise Munroe, MD [facility] 5:10 PM  11/24/2018

## 2018-11-24 NOTE — Progress Notes (Signed)
Pt taken off Bipap at this time.  Pt states breathing is much better.  Pt does not seem to be in distress at this time.  Placed on 2L [place] tolerating well, RT will monitor

## 2018-11-24 NOTE — ED Provider Notes (Signed)
[facility] EMERGENCY DEPARTMENT Provider Note   CSN: [id number] Arrival date & time: 11/24/18  7322     History   Chief Complaint Chief Complaint  Patient presents with  . Shortness of Breath    HPI Brittany [name] is a 57 y.o. female.  HPI Level 5 caveat for acute respiratory distress.  57 year old female comes in a chief complaint of shortness of breath.  She has history of CHF with EF 20%, CAD status post recent CABG, paroxysmal A. fib not on anticoagulation.  According to the EMS, patient called 911 for shortness of breath.  When they arrived patient was tachypneic, diaphoretic and tripoding.  Patient was placed on CPAP and brought to the ER.  Patient had diffuse rales and wheezing therefore she had received Solu-Medrol along with magnesium and DuoNeb EN route to the ED.  Patient states that she has been gradually getting short of breath, however her symptoms progressed quickly overnight.  She states that her current symptoms are different than her heart attack symptoms.  She has been taking her medications as prescribed.  There is no history of PE, DVT.  Past Medical History:  Diagnosis Date  . Acute systolic congestive heart failure ([place]) 02/03/2018  . Allergy   . Anxiety   . Asthma   . Hypertension   . PAF (paroxysmal atrial fibrillation) ([place])   . Prophylactic measure 08/03/14-08/19/14   Prophyl. cranial radiation 24 Gy  . 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 Small cell lung cancer ([place]) 03/16/2014    Patient Active Problem List   Diagnosis Date Noted  . Recurrent pleural effusion on right 05/05/2018  . Acute respiratory failure ([place]) 05/05/2018  . Dyspnea 04/19/2018  . Chronic respiratory failure with hypoxia ([place])   . Acute on chronic systolic CHF (congestive heart failure) ([place]) 02/03/2018  . S/P CABG (coronary  artery bypass graft) 02/03/2018  . Asthma   . Anxiety   . Allergy   . HCAP (healthcare-associated pneumonia) 03/15/2015  . Chest pain 03/15/2015  . Small cell lung cancer ([place]) 03/16/2014  . Protein-calorie malnutrition, severe ([place]) 03/14/2014  . Atrial fibrillation ([place]) 03/12/2014  . HTN (hypertension) 05/07/2012    Past Surgical History:  Procedure Laterality Date  . CESAREAN SECTION    . CORONARY ARTERY BYPASS GRAFT N/A 02/03/2018   Procedure: CORONARY ARTERY BYPASS GRAFTING (CABG);  Surgeon: Rexene Alberts, MD;  Location: [facility];  Service: Open Heart Surgery;  Laterality: N/A;  Time 3 using left internal mammary artery and endoscopically harvested right saphenous vein  . CORONARY BALLOON ANGIOPLASTY N/A 02/03/2018   Procedure: CORONARY BALLOON ANGIOPLASTY;  Surgeon: Martinique, Peter M, MD;  Location: [facility];  Service: Cardiovascular;  Laterality: N/A;  . CORONARY/GRAFT ACUTE MI REVASCULARIZATION N/A 02/03/2018   Procedure: Coronary/Graft Acute MI Revascularization;  Surgeon: Martinique, Peter M, MD;  Location: [facility];  Service: Cardiovascular;  Laterality: N/A;  . IABP INSERTION N/A 02/03/2018   Procedure: IABP Insertion;  Surgeon: Martinique, Peter M, MD;  Location: [facility];  Service: Cardiovascular;  Laterality: N/A;  . INTRAOPERATIVE TRANSESOPHAGEAL ECHOCARDIOGRAM N/A 02/03/2018   Procedure: INTRAOPERATIVE TRANSESOPHAGEAL ECHOCARDIOGRAM;  Surgeon: Rexene Alberts, MD;  Location: [facility];  Service: Open Heart Surgery;  Laterality: N/A;  . LEFT HEART CATH AND CORONARY ANGIOGRAPHY N/A 02/03/2018   Procedure:  LEFT HEART CATH AND CORONARY ANGIOGRAPHY;  Surgeon: Martinique, Peter M, MD;  Location: [facility];  Service: Cardiovascular;  Laterality: N/A;  . MEDIASTINOSCOPY N/A 03/11/2014   Procedure: MEDIASTINOSCOPY;  Surgeon: Melrose Nakayama, MD;  Location: [facility];  Service: Thoracic;  Laterality: N/A;  . PLACEMENT OF [place] LEFT VENTRICULAR ASSIST DEVICE   02/03/2018   Procedure: PLACEMENT OF [place] LEFT VENTRICULAR ASSIST DEVICE LD;  Surgeon: Rexene Alberts, MD;  Location: [facility];  Service: Open Heart Surgery;;  . REMOVAL OF IMPELLA LEFT VENTRICULAR ASSIST DEVICE N/A 02/08/2018   Procedure: REMOVAL OF [place] LEFT VENTRICULAR ASSIST DEVICE;  Surgeon: Rexene Alberts, MD;  Location: [facility];  Service: Open Heart Surgery;  Laterality: N/A;  . RIGHT HEART CATH N/A 02/03/2018   Procedure: RIGHT HEART CATH;  Surgeon: Martinique, Peter M, MD;  Location: [facility];  Service: Cardiovascular;  Laterality: N/A;  . RIGHT HEART CATH N/A 05/09/2018   Procedure: RIGHT HEART CATH;  Surgeon: Jolaine Artist, MD;  Location: [facility];  Service: Cardiovascular;  Laterality: N/A;  . TEE WITHOUT CARDIOVERSION N/A 02/08/2018   Procedure: TRANSESOPHAGEAL ECHOCARDIOGRAM (TEE);  Surgeon: Rexene Alberts, MD;  Location: [facility];  Service: Open Heart Surgery;  Laterality: N/A;  . TUBAL LIGATION    . VIDEO BRONCHOSCOPY WITH ENDOBRONCHIAL ULTRASOUND N/A 03/11/2014   Procedure: VIDEO BRONCHOSCOPY WITH ENDOBRONCHIAL ULTRASOUND;  Surgeon: Melrose Nakayama, MD;  Location: [facility];  Service: Thoracic;  Laterality: N/A;     OB History   No obstetric history on file.      Home Medications    Prior to Admission medications   Medication Sig Start Date End Date Taking? Authorizing Provider  aspirin 81 MG EC tablet Take 1 tablet (81 mg total) by mouth daily. 07/03/18   Bensimhon, Shaune Pascal, MD  atorvastatin (LIPITOR) 80 MG tablet Take 1 tablet (80 mg total) by mouth daily at 6 PM. 07/03/18   Bensimhon, Shaune Pascal, MD  clopidogrel (PLAVIX) 75 MG tablet Take 1 tablet (75 mg total) by mouth daily. 07/03/18   Bensimhon, Shaune Pascal, MD  ipratropium-albuterol (DUONEB) 0.5-2.5 (3) MG/3ML SOLN Take 3 mLs by nebulization 2 (two) times daily. 05/23/18   Bensimhon, Shaune Pascal, MD  ivabradine (CORLANOR) 7.5 MG TABS tablet Take 1 tablet (7.5 mg total) by mouth 2 (two) times daily with a  meal. 07/03/18   Bensimhon, Shaune Pascal, MD  nitroGLYCERIN (NITROSTAT) 0.4 MG SL tablet Place 1 tablet (0.4 mg total) under the tongue every 5 (five) minutes as needed for chest pain. 05/23/18   Bensimhon, Shaune Pascal, MD  spironolactone (ALDACTONE) 25 MG tablet Take 1 tablet (25 mg total) by mouth daily. 11/15/18   Bensimhon, Shaune Pascal, MD  torsemide (DEMADEX) 20 MG tablet Take 60 mg by mouth daily.    [provider]    Family History Family History  Problem Relation Age of Onset  . Heart disease Mother   . Hypertension Mother   . Heart attack Mother   . Hypertension Maternal Grandmother   . Cancer Maternal Grandmother   . Diabetes Paternal Grandmother   . Stroke Neg Hx     Social History Social History   Tobacco Use  . Smoking status: Former Smoker    Packs/day: 1.00    Years: 20.00    Pack years: 20.00    Types: Cigarettes    Last attempt to quit: 03/13/2014    Years since quitting: 4.7  . Smokeless  tobacco: Never Used  Substance Use Topics  . Alcohol use: Yes    Alcohol/week: 8.0 standard drinks    Types: 8 Glasses of wine per week  . Drug use: No     Allergies   Iodinated diagnostic agents; Latex; Pineapple; Sulfa antibiotics; and Codeine   Review of Systems Review of Systems  Unable to perform ROS: Severe respiratory distress     Physical Exam Updated Vital Signs BP 107/69   Pulse 82   Temp 98.2 F (36.8 C) (Oral)   Resp (!) 22   Ht 4' 11.5" (1.511 m)   Wt 46.4 kg   SpO2 98%   BMI 20.32 kg/m   Physical Exam Vitals signs and nursing note reviewed.  Constitutional:      Appearance: She is well-developed.  HENT:     Head: Normocephalic and atraumatic.  Eyes:     Conjunctiva/sclera: Conjunctivae normal.     Pupils: Pupils are equal, round, and reactive to light.  Neck:     Musculoskeletal: Normal range of motion and neck supple.  Cardiovascular:     Rate and Rhythm: Normal rate and regular rhythm.  Pulmonary:     Effort: Tachypnea and  respiratory distress present.     Breath sounds: Examination of the right-middle field reveals decreased breath sounds. Examination of the right-lower field reveals decreased breath sounds. Decreased breath sounds, wheezing and rales present.  Abdominal:     General: Bowel sounds are normal. There is no distension.     Palpations: Abdomen is soft.     Tenderness: There is no abdominal tenderness. There is no guarding or rebound.  Skin:    General: Skin is warm and dry.  Neurological:     Mental Status: She is alert and oriented to person, place, and time.      ED Treatments / Results  Labs (all labs ordered are listed, but only abnormal results are displayed) Labs Reviewed  BASIC METABOLIC PANEL - Abnormal; Notable for the following components:      Result Value   CO2 18 (*)    Glucose, Bld 251 (*)    Anion gap 17 (*)    All other components within normal limits  MAGNESIUM - Abnormal; Notable for the following components:   Magnesium 3.8 (*)    All other components within normal limits  BRAIN NATRIURETIC PEPTIDE - Abnormal; Notable for the following components:   B Natriuretic Peptide 2,479.2 (*)    All other components within normal limits  CBC - Abnormal; Notable for the following components:   Hemoglobin 11.6 (*)    MCH 25.8 (*)    MCHC 27.6 (*)    RDW 20.0 (*)    All other components within normal limits  TROPONIN I - Abnormal; Notable for the following components:   Troponin I 0.03 (*)    All other components within normal limits  I-STAT TROPONIN, ED    EKG None  Radiology Dg Chest Portable 1 View  Result Date: 11/24/2018 CLINICAL DATA:  Shortness of breath. EXAM: PORTABLE CHEST 1 VIEW COMPARISON:  Radiograph 11/15/2018. Most recent CT 04/20/2018 FINDINGS: Progression of right pleural effusion since prior exam, now partially loculated. Left-sided pacemaker in place. Post median sternotomy. Cardiomegaly is unchanged. Unchanged mediastinal contours with post  treatment related changes at the right hilum. Suspect mild interstitial edema. Small left pleural effusion. No pneumothorax. IMPRESSION: 1. Progression of right pleural effusion over the past 9 days, now partially loculated. 2. Suspect mild interstitial edema. Small left  pleural effusion. Electronically Signed   By: Keith Rake M.D.   On: 11/24/2018 04:53    Procedures .Critical Care Performed by: Varney Biles, MD Authorized by: Varney Biles, MD   Critical care provider statement:    Critical care time (minutes):  45   Critical care start time:  11/24/2018 3:40 AM   Critical care end time:  11/24/2018 5:34 AM   Critical care time was exclusive of:  Separately billable procedures and treating other patients   Critical care was necessary to treat or prevent imminent or life-threatening deterioration of the following conditions:  Respiratory failure and cardiac failure   Critical care was time spent personally by me on the following activities:  Discussions with consultants, evaluation of patient's response to treatment, examination of patient, ordering and performing treatments and interventions, ordering and review of laboratory studies, ordering and review of radiographic studies, pulse oximetry, re-evaluation of patient's condition, obtaining history from patient or surrogate and review of old charts   (including critical care time)  Medications Ordered in ED Medications  furosemide (LASIX) injection 20 mg (has no administration in time range)     Initial Impression / Assessment and Plan / ED Course  I have reviewed the triage vital signs and the nursing notes.  Pertinent labs & imaging results that were available during my care of the patient were reviewed by me and considered in my medical decision making (see chart for details).  Clinical Course as of Nov 24 532  Sun Nov 24, 2018  0531 Patient's labs reviewed.  Her BNP is over 2400.  Chest x-ray showing that she has  large right-sided pleural effusion that is worsening.  Clinically there is no concerns for pneumonia for this acute shortness of breath -no white count and no fevers are reassuring.  We will give her IV Lasix and admit. Patient reassessed and she is a lot more comfortable than when she arrived.  We will try to see if we can remove the BiPAP.   [AN]    Clinical Course User Index [AN] Varney Biles, MD    57 year old female with history of CHF with EF of 20%, CAD comes in with chief complaint of acute respiratory distress.  She has also history of pleural effusion that required thoracentesis in the past.  On my evaluation she is noted to be in respiratory distress, diaphoretic and placed on CPAP.  No pitting edema appreciated on the exam.  She has diffuse wheezing and rales.  She has received medications for COPD, however this appears to be pulmonary edema versus worsening pleural effusion.  Patient is afebrile in the ED and denies any recent fevers, therefore pneumonia also considered in the differential however suspicion for an infectious process is lower.  We will continue BiPAP.  Basic labs have been ordered.  Final Clinical Impressions(s) / ED Diagnoses   Final diagnoses:  Acute respiratory failure with hypoxia (HCC)  Acute on chronic congestive heart failure, unspecified heart failure type [facility])  Pleural effusion    ED Discharge Orders    None       Varney Biles, MD 11/24/18 [phone]

## 2018-11-24 NOTE — Progress Notes (Signed)
Brittany [name] [id number] Admission Data: 11/24/2018 1:13 PM Attending Provider: Thurnell Lose, MD  [id number], Durel Salts Consults/ Treatment Team: Treatment Team:  [facility]iology, Michae Kava, MD  Brittany [name] is a 57 y.o. female patient admitted from ED awake, alert  & orientated  X 3,  Full Code, VSS - Blood pressure 115/68, pulse 75, temperature 98.8 F (37.1 C), temperature source Oral, resp. rate 20, height 4\' 11"  (1.499 m), weight 48.3 kg, SpO2 100 %., O2  2L nasal cannular, no c/o shortness of breath, no c/o chest pain, no distress noted. Tele #[id number] and pt is currently running:normal sinus rhythm.   IV site WDL:  antecubital left, condition patent and no redness with a transparent dsg that's clean dry and intact.  Allergies:   Allergies  Allergen Reactions  . Codeine Nausea And Vomiting     Past Medical History:  Diagnosis Date  . Acute systolic congestive heart failure ([place]) 02/03/2018  . Allergy   . Anxiety   . Asthma   . Hypertension   . PAF (paroxysmal atrial fibrillation) ([place])   . Prophylactic measure 08/03/14-08/19/14   Prophyl. cranial radiation 24 Gy  . 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 Small cell lung cancer ([place]) 03/16/2014     orientated to unit, room and routine. Information packet given to patient/family and safety video watched.  Admission INP armband ID verified with patient/family, and in place. SR up x 2, fall risk assessment complete with Patient and family verbalizing understanding of risks associated with falls. Pt verbalizes an understanding of how to use the call bell and to call for help before getting out of bed.    Will cont to monitor and assist as needed.  [name], RN 11/24/2018 1:13 PM

## 2018-11-24 NOTE — Progress Notes (Signed)
PROGRESS NOTE                                                                                                                                                                                                             Patient Demographics:    Brittany [name], is a 57 y.o. female, DOB - Jul 02, 1961, AJO:[id number]  Admit date - 11/24/2018   Admitting Physician No admitting provider for patient encounter.  Outpatient Primary MD for the patient is Scifres, [name], PA-C  LOS - 0  Chief Complaint  Patient presents with  . Shortness of Breath       Brief Narrative   Brittany [name] is a 57 y.o. female with medical history significant of CHF, EF 20% due to ICM, had emergent CABG in Feb this year.  SCLC back in 2015 that seems to be in remission as far as anyone knows or can tell.  Patient has been suffering from recurrent R sided pleural effusions earlier this year.  Had pleurex cath at one point and ultimately had mechanical pleurodesis 09/09/18 at [facility].  Throughout all of this it seems that the re-occurring effusion is due to CHF (repeated cytologies keep finding no malignant cells). She had been doing okay, and even saw Dr. [name] x9 days ago with fairly clear lungs on CXR at that time.  2 days ago she had some pizza and after that she has been acutely short of breath came to the ER where she was thought to have worsening of her right-sided transudative pleural effusion and she was admitted to the hospital.  Further work-up and history taking suggested that this might be more CHF than complications of her pleural effusion.   Subjective:    Brittany [name] today has, No headache, No chest pain, No abdominal pain - No Nausea, No new weakness tingling or numbness, No Cough -he does have positive orthopnea and shortness of breath.   Assessment  & Plan :     1.  Acute on chronic hypoxic respiratory failure likely due to due to on chronic systolic CHF with  underlying EF 20% and mild to moderate reoccurrence of right-sided transudative pleural effusion.  Viewed patient's chart in detail also from [facility], discussed her x-ray findings with cardiothoracic surgeon Dr.Owen.  Her pleural effusion does not seem to be the main issue, this seems to be dietary noncompliance related acute on chronic systolic CHF with EF around 20%.  Note ultrasound-guided therapeutic right-sided pleurocentesis has been ordered I will continue as I  do not think it will harm and if significant fluid is removed she might benefit from it.  Mainstay of treatment will be diuresis with IV Lasix moderate to high dose, continuing her CHF medications which include Aldactone and Ivabradine, salt and fluid restriction, intake and output monitoring, counseled her on diet compliance, will also have her seen by CHF team.  She was initially on BiPAP which I think can be removed, will give her nasal cannula oxygen along with nebulizer treatments for supportive care.  2.  Recurrent right-sided loculated but transudative pleural effusion to be due to CHF.  S/p pleurodesis at [facility] a few months ago.  Not a surgical candidate at this time, supportive care as above.  3.  Small cell lung cancer.  In remission.  Multiple cytology from pleural effusion have been negative.  4.  COPD.  Stable.  Supportive care.  Quit smoking long time ago according to the patient.  5.  Paroxysmal atrial fibrillation.  Mali vas 2 score of at least 3.  Not on anticoagulation.  Continue dual antiplatelet agents along with rate controlling agents if needed.  6.  CAD status post CABG.  Continue dual antiplatelet therapy for secondary prevention.      Note from [facility] heart failure team on 09/26/2018  Recurrent transudative pleural effusion attributed to HF. She has had multiple hospitalizations for hypoxic respiratory failure related to recurrent pleural effusion with recent placement of Pleurex at OSH 9/20. RHC with very  mildly elevated filling pressures. Consulted CTS who recommended mechanical pleurodesis, which was performed on 9/30 with right chest tube placement. Unfortunately, post-operatively the patient continued to experience significant post-operative pain and had a persistent O2 requirement. Acute pain team consulted for management of post-operative pain. Briefly on ketamine and dilaudid PCA, then eventually required epidural placement as well as narcan drip for itching, PRN oxycodone for break through pain, tylenol, gabapentin and colchicine. CT chest 10/8 (noted below) which showed a decrease in the effusion. Epidural and narcan weaned off 10/9 and transitioned to oral oxycodone. Chest tube was ultimately pulled on 10/10. She continued to have constant rib and pleuritic pain throughout post op course, and was overall very frustrated and disappointed that she feels worse now than she did prior to the procedure, due to the pain and O2 requirement. Repeat CT on 10/13 showed slightly increased size of loculated hydropneumothorax, however CTS felt there was no further intervention to be done at this time. Would recommend continued aggressive chest PT, ambulation, Incentive spirometer, diuresis and conservative measures.    Family Communication  : None Present  Code Status : Full  Disposition Plan  : Telemetry  Consults  : Cardiothoracic surgery Dr.Owen over the phone, CHF team,  Procedures  :   Chest x-ray  Ultrasound-guided thoracentesis ordered on 11/24/2018 by the admitted.  DVT Prophylaxis  :  Lovenox    Lab Results  Component Value Date   PLT 237 11/24/2018    Diet :  Diet Order            DIET SOFT Room service appropriate? Yes; Fluid consistency: Thin; Fluid restriction: 1200 mL Fluid  Diet effective now               Inpatient Medications Scheduled Meds: . aspirin EC  81 mg Oral Daily  . atorvastatin  80 mg Oral q1800  . clopidogrel  75 mg Oral Daily  . enoxaparin (LOVENOX)  injection  40 mg Subcutaneous Q24H  . furosemide  60 mg Intravenous  BID  . insulin aspart  0-9 Units Subcutaneous Q4H  . ipratropium-albuterol  3 mL Nebulization BID  . ivabradine  7.5 mg Oral BID WC  . potassium chloride  40 mEq Oral Once  . sodium chloride flush  3 mL Intravenous Q12H  . spironolactone  25 mg Oral Daily   Continuous Infusions: . sodium chloride     PRN Meds:.sodium chloride, acetaminophen, ondansetron (ZOFRAN) IV, sodium chloride flush  Antibiotics  :   Anti-infectives (From admission, onward)   None          Objective:   Vitals:   11/24/18 0645 11/24/18 0715 11/24/18 0730 11/24/18 0755  BP: 117/82 103/64 110/69   Pulse: 81 75 74   Resp: (!) 22 18 19 18   Temp:      TempSrc:      SpO2: 100% 98% 98%   Weight:      Height:        Wt Readings from Last 3 Encounters:  11/24/18 46.4 kg  11/15/18 46.4 kg  07/03/18 44.9 kg    No intake or output data in the 24 hours ending 11/24/18 0815   Physical Exam  Awake Alert, Oriented X 3, No new F.N deficits, Normal affect [place].AT,PERRAL Supple Neck,No JVD, No cervical lymphadenopathy appriciated.  Symmetrical Chest wall movement, reduced right basilar breath sounds with bilateral rails RRR,No Gallops,Rubs or new Murmurs, No Parasternal Heave +ve B.Sounds, Abd Soft, No tenderness, No organomegaly appriciated, No rebound - guarding or rigidity. No Cyanosis, Clubbing or edema, No new Rash or bruise       Data Review:    CBC Recent Labs  Lab 11/24/18 0345  WBC 8.2  HGB 11.6*  HCT 42.0  PLT 237  MCV 93.3  MCH 25.8*  MCHC 27.6*  RDW 20.0*    Chemistries  Recent Labs  Lab 11/24/18 0345  NA 141  K 3.6  CL 106  CO2 18*  GLUCOSE 251*  BUN 12  CREATININE 0.92  CALCIUM 9.5  MG 3.8*   ------------------------------------------------------------------------------------------------------------------ No results for input(s): CHOL, HDL, LDLCALC, TRIG, CHOLHDL, LDLDIRECT in the last 72  hours.  Lab Results  Component Value Date   HGBA1C 5.8 (H) 04/22/2018   ------------------------------------------------------------------------------------------------------------------ No results for input(s): TSH, T4TOTAL, T3FREE, THYROIDAB in the last 72 hours.  Invalid input(s): FREET3 ------------------------------------------------------------------------------------------------------------------ No results for input(s): VITAMINB12, FOLATE, FERRITIN, TIBC, IRON, RETICCTPCT in the last 72 hours.  Coagulation profile No results for input(s): INR, PROTIME in the last 168 hours.  No results for input(s): DDIMER in the last 72 hours.  Cardiac Enzymes Recent Labs  Lab 11/24/18 0345  TROPONINI 0.03*   ------------------------------------------------------------------------------------------------------------------    Component Value Date/Time   BNP 2,479.2 (H) 11/24/2018 0345    Micro Results No results found for this or any previous visit (from the past 240 hour(s)).  Radiology Reports Dg Chest 2 View  Result Date: 11/15/2018 CLINICAL DATA:  Recurrent RIGHT pleural effusion. EXAM: CHEST - 2 VIEW COMPARISON:  05/10/2018 FINDINGS: Cardiomegaly, CABG changes and LEFT ICD noted. A small RIGHT pleural effusion with RIGHT LOWER lung atelectasis have slightly increased. There is no evidence of pneumothorax or LEFT pleural effusion. No acute bony abnormalities are present. IMPRESSION: Slightly increasing small RIGHT pleural effusion and RIGHT LOWER lung atelectasis. Cardiomegaly Electronically Signed   By: Margarette Canada M.D.   On: 11/15/2018 16:39   Dg Chest Portable 1 View  Result Date: 11/24/2018 CLINICAL DATA:  Shortness of breath. EXAM: PORTABLE CHEST 1 VIEW COMPARISON:  Radiograph 11/15/2018. Most recent CT 04/20/2018 FINDINGS: Progression of right pleural effusion since prior exam, now partially loculated. Left-sided pacemaker in place. Post median sternotomy. Cardiomegaly is  unchanged. Unchanged mediastinal contours with post treatment related changes at the right hilum. Suspect mild interstitial edema. Small left pleural effusion. No pneumothorax. IMPRESSION: 1. Progression of right pleural effusion over the past 9 days, now partially loculated. 2. Suspect mild interstitial edema. Small left pleural effusion. Electronically Signed   By: Keith Rake M.D.   On: 11/24/2018 04:53    Time Spent in minutes  30   Lala Lund M.D on 11/24/2018 at 8:15 AM  To page go to [email or web address] - password [facility]

## 2018-11-24 NOTE — ED Notes (Signed)
Attempted report 

## 2018-11-24 NOTE — Progress Notes (Signed)
Flutter & IS given to pt at this time per MD order.  Pt states she does not feel like doing at this time.  Will check back with pt

## 2018-11-24 NOTE — ED Notes (Signed)
Pt transported to ultrasound.

## 2018-11-24 NOTE — Progress Notes (Signed)
Informed Dr. Candiss Norse that patient is requesting her home dose of Xanax at 0.25mg  every 8 hours as needed. MD ordered home dose of medication and ordered to give IV lasix early.

## 2018-11-24 NOTE — H&P (Addendum)
History and Physical    Brittany [name] VOZ:[id number] DOB: 05-Mar-1961 DOA: 11/24/2018  PCP: Maude Leriche, PA-C  Patient coming from: Home  I have personally briefly reviewed patient's old medical records in [place]  Chief Complaint: SOB  HPI: Brittany [name] is a 57 y.o. female with medical history significant of CHF, EF 20% due to ICM, had emergent CABG in Feb this year.  SCLC back in 2015 that seems to be in remission as far as anyone knows or can tell.  Patient has been suffering from recurrent R sided pleural effusions earlier this year.  Had pleurex cath at one point and ultimately had mechanical pleurodesis 09/09/18 at [facility].  Throughout all of this it seems that the re-occurring effusion is due to CHF (repeated cytologies keep finding no malignant cells).  She had been doing okay, and even saw Dr. [name] x9 days ago with fairly clear lungs on CXR at that time.  However, the past day or two she has been becoming gradually more SOB, which quickly progressed overnight.  She had to call 911 and presented to the ED in respiratory distress.   ED Course: Stabilized on BIPAP.  CXR shows recurrent, partially loculated R pleural effusion with progression over the past 9 days noted.  BNP over 2400.  WBC only 8.2.  No fever.  Trop 0.03.  No fevers, chills.   Review of Systems: As per HPI otherwise 10 point review of systems negative.   Past Medical History:  Diagnosis Date  . Acute systolic congestive heart failure ([place]) 02/03/2018  . Allergy   . Anxiety   . Asthma   . Hypertension   . PAF (paroxysmal atrial fibrillation) ([place])   . Prophylactic measure 08/03/14-08/19/14   Prophyl. cranial radiation 24 Gy  . 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 Small cell lung cancer ([place]) 03/16/2014    Past Surgical History:  Procedure Laterality  Date  . CESAREAN SECTION    . CORONARY ARTERY BYPASS GRAFT N/A 02/03/2018   Procedure: CORONARY ARTERY BYPASS GRAFTING (CABG);  Surgeon: Rexene Alberts, MD;  Location: [place];  Service: Open Heart Surgery;  Laterality: N/A;  Time 3 using left internal mammary artery and endoscopically harvested right saphenous vein  . CORONARY BALLOON ANGIOPLASTY N/A 02/03/2018   Procedure: CORONARY BALLOON ANGIOPLASTY;  Surgeon: Martinique, Peter M, MD;  Location: [place] [facility];  Service: Cardiovascular;  Laterality: N/A;  . CORONARY/GRAFT ACUTE MI REVASCULARIZATION N/A 02/03/2018   Procedure: Coronary/Graft Acute MI Revascularization;  Surgeon: Martinique, Peter M, MD;  Location: [place] [facility];  Service: Cardiovascular;  Laterality: N/A;  . IABP INSERTION N/A 02/03/2018   Procedure: IABP Insertion;  Surgeon: Martinique, Peter M, MD;  Location: [place] [facility];  Service: Cardiovascular;  Laterality: N/A;  . INTRAOPERATIVE TRANSESOPHAGEAL ECHOCARDIOGRAM N/A 02/03/2018   Procedure: INTRAOPERATIVE TRANSESOPHAGEAL ECHOCARDIOGRAM;  Surgeon: Rexene Alberts, MD;  Location: [place];  Service: Open Heart Surgery;  Laterality: N/A;  . LEFT HEART CATH AND CORONARY ANGIOGRAPHY N/A 02/03/2018   Procedure: LEFT HEART CATH AND CORONARY ANGIOGRAPHY;  Surgeon: Martinique, Peter M, MD;  Location: [place] [facility];  Service: Cardiovascular;  Laterality: N/A;  . MEDIASTINOSCOPY N/A 03/11/2014   Procedure: MEDIASTINOSCOPY;  Surgeon: Melrose Nakayama, MD;  Location: [place];  Service: Thoracic;  Laterality: N/A;  . PLACEMENT OF IMPELLA  LEFT VENTRICULAR ASSIST DEVICE  02/03/2018   Procedure: PLACEMENT OF [place] LEFT VENTRICULAR ASSIST DEVICE LD;  Surgeon: Rexene Alberts, MD;  Location: [place];  Service: Open Heart Surgery;;  . REMOVAL OF IMPELLA LEFT VENTRICULAR ASSIST DEVICE N/A 02/08/2018   Procedure: REMOVAL OF [place] LEFT VENTRICULAR ASSIST DEVICE;  Surgeon: Rexene Alberts, MD;  Location: [place];  Service: Open Heart Surgery;   Laterality: N/A;  . RIGHT HEART CATH N/A 02/03/2018   Procedure: RIGHT HEART CATH;  Surgeon: Martinique, Peter M, MD;  Location: [place] [facility];  Service: Cardiovascular;  Laterality: N/A;  . RIGHT HEART CATH N/A 05/09/2018   Procedure: RIGHT HEART CATH;  Surgeon: Jolaine Artist, MD;  Location: [place] [facility];  Service: Cardiovascular;  Laterality: N/A;  . TEE WITHOUT CARDIOVERSION N/A 02/08/2018   Procedure: TRANSESOPHAGEAL ECHOCARDIOGRAM (TEE);  Surgeon: Rexene Alberts, MD;  Location: [place];  Service: Open Heart Surgery;  Laterality: N/A;  . TUBAL LIGATION    . VIDEO BRONCHOSCOPY WITH ENDOBRONCHIAL ULTRASOUND N/A 03/11/2014   Procedure: VIDEO BRONCHOSCOPY WITH ENDOBRONCHIAL ULTRASOUND;  Surgeon: Melrose Nakayama, MD;  Location: [place];  Service: Thoracic;  Laterality: N/A;     reports that she quit smoking about 4 years ago. Her smoking use included cigarettes. She has a 20.00 pack-year smoking history. She has never used smokeless tobacco. She reports current alcohol use of about 8.0 standard drinks of alcohol per week. She reports that she does not use drugs.  Allergies  Allergen Reactions  . Iodinated Diagnostic Agents Hives and Rash    Needs 13-hour prep before CT scans w/ contrast.  Can do Benadryl 50mg  PO for ESI's, etc  . Latex Hives and Other (See Comments)    Skin irritation  . Pineapple Hives  . Sulfa Antibiotics Hives and Rash  . Codeine Nausea And Vomiting    Family History  Problem Relation Age of Onset  . Heart disease Mother   . Hypertension Mother   . Heart attack Mother   . Hypertension Maternal Grandmother   . Cancer Maternal Grandmother   . Diabetes Paternal Grandmother   . Stroke Neg Hx      Prior to Admission medications   Medication Sig Start Date End Date Taking? Authorizing Provider  aspirin 81 MG EC tablet Take 1 tablet (81 mg total) by mouth daily. 07/03/18   Bensimhon, Shaune Pascal, MD  atorvastatin (LIPITOR) 80 MG tablet Take 1 tablet (80 mg  total) by mouth daily at 6 PM. 07/03/18   Bensimhon, Shaune Pascal, MD  clopidogrel (PLAVIX) 75 MG tablet Take 1 tablet (75 mg total) by mouth daily. 07/03/18   Bensimhon, Shaune Pascal, MD  ipratropium-albuterol (DUONEB) 0.5-2.5 (3) MG/3ML SOLN Take 3 mLs by nebulization 2 (two) times daily. 05/23/18   Bensimhon, Shaune Pascal, MD  ivabradine (CORLANOR) 7.5 MG TABS tablet Take 1 tablet (7.5 mg total) by mouth 2 (two) times daily with a meal. 07/03/18   Bensimhon, Shaune Pascal, MD  nitroGLYCERIN (NITROSTAT) 0.4 MG SL tablet Place 1 tablet (0.4 mg total) under the tongue every 5 (five) minutes as needed for chest pain. 05/23/18   Bensimhon, Shaune Pascal, MD  spironolactone (ALDACTONE) 25 MG tablet Take 1 tablet (25 mg total) by mouth daily. 11/15/18   Bensimhon, Shaune Pascal, MD  torsemide (DEMADEX) 20 MG tablet Take 60 mg by mouth daily.    [provider]    Physical Exam: Vitals:   11/24/18 0530 11/24/18 0540 11/24/18 0545 11/24/18  0600  BP: 121/78 121/78 123/73 125/84  Pulse: 81 83 82 83  Resp: (!) 21 20 20  (!) 22  Temp:      TempSrc:      SpO2: 98% 99% 97% 100%  Weight:      Height:        Constitutional: NAD, calm, comfortable Eyes: PERRL, lids and conjunctivae normal ENMT: Mucous membranes are moist. Posterior pharynx clear of any exudate or lesions.Normal dentition.  Neck: normal, supple, no masses, no thyromegaly Respiratory: Absent at R base.  Cardiovascular: Regular rate and rhythm, no murmurs / rubs / gallops. No extremity edema. 2+ pedal pulses. No carotid bruits.  Abdomen: no tenderness, no masses palpated. No hepatosplenomegaly. Bowel sounds positive.  Musculoskeletal: no clubbing / cyanosis. No joint deformity upper and lower extremities. Good ROM, no contractures. Normal muscle tone.  Skin: no rashes, lesions, ulcers. No induration Neurologic: CN 2-12 grossly intact. Sensation intact, DTR normal. Strength 5/5 in all 4.  Psychiatric: Normal judgment and insight. Alert and oriented x 3. Normal  mood.    Labs on Admission: I have personally reviewed following labs and imaging studies  CBC: Recent Labs  Lab 11/24/18 0345  WBC 8.2  HGB 11.6*  HCT 42.0  MCV 93.3  PLT 094   Basic Metabolic Panel: Recent Labs  Lab 11/24/18 0345  NA 141  K 3.6  CL 106  CO2 18*  GLUCOSE 251*  BUN 12  CREATININE 0.92  CALCIUM 9.5  MG 3.8*   GFR: Estimated Creatinine Clearance: 46.7 mL/min (by C-G formula based on SCr of 0.92 mg/dL). Liver Function Tests: No results for input(s): AST, ALT, ALKPHOS, BILITOT, PROT, ALBUMIN in the last 168 hours. No results for input(s): LIPASE, AMYLASE in the last 168 hours. No results for input(s): AMMONIA in the last 168 hours. Coagulation Profile: No results for input(s): INR, PROTIME in the last 168 hours. Cardiac Enzymes: Recent Labs  Lab 11/24/18 0345  TROPONINI 0.03*   BNP (last 3 results) No results for input(s): PROBNP in the last 8760 hours. HbA1C: No results for input(s): HGBA1C in the last 72 hours. CBG: No results for input(s): GLUCAP in the last 168 hours. Lipid Profile: No results for input(s): CHOL, HDL, LDLCALC, TRIG, CHOLHDL, LDLDIRECT in the last 72 hours. Thyroid Function Tests: No results for input(s): TSH, T4TOTAL, FREET4, T3FREE, THYROIDAB in the last 72 hours. Anemia Panel: No results for input(s): VITAMINB12, FOLATE, FERRITIN, TIBC, IRON, RETICCTPCT in the last 72 hours. Urine analysis:    Component Value Date/Time   COLORURINE YELLOW 02/27/2018 1255   APPEARANCEUR HAZY (A) 02/27/2018 1255   LABSPEC 1.014 02/27/2018 1255   PHURINE 8.0 02/27/2018 1255   GLUCOSEU NEGATIVE 02/27/2018 1255   HGBUR NEGATIVE 02/27/2018 1255   BILIRUBINUR NEGATIVE 02/27/2018 1255   KETONESUR NEGATIVE 02/27/2018 1255   PROTEINUR NEGATIVE 02/27/2018 1255   UROBILINOGEN 1.0 03/15/2015 1117   NITRITE NEGATIVE 02/27/2018 1255   LEUKOCYTESUR SMALL (A) 02/27/2018 1255    Radiological Exams on Admission: Dg Chest Portable 1  View  Result Date: 11/24/2018 CLINICAL DATA:  Shortness of breath. EXAM: PORTABLE CHEST 1 VIEW COMPARISON:  Radiograph 11/15/2018. Most recent CT 04/20/2018 FINDINGS: Progression of right pleural effusion since prior exam, now partially loculated. Left-sided pacemaker in place. Post median sternotomy. Cardiomegaly is unchanged. Unchanged mediastinal contours with post treatment related changes at the right hilum. Suspect mild interstitial edema. Small left pleural effusion. No pneumothorax. IMPRESSION: 1. Progression of right pleural effusion over the past 9 days, now  partially loculated. 2. Suspect mild interstitial edema. Small left pleural effusion. Electronically Signed   By: Keith Rake M.D.   On: 11/24/2018 04:53    EKG: Independently reviewed.  Assessment/Plan Principal Problem:   Acute on chronic systolic CHF (congestive heart failure) (HCC) Active Problems:   Atrial fibrillation (HCC)   Small cell lung cancer (H[facility])   S/P CABG (coronary artery bypass graft)   Chronic respiratory failure with hypoxia (H[facility])   Recurrent pleural effusion on right   Acute respiratory failure ([place])    1. Acute on chronic systolic CHF / recurrent pleural effusion on R - 1. Stabilized with BIPAP 2. IR eval and drain this AM hopefully 1. Will send fluid for labs, and cytology, although it seems this always comes back negative for malignant cells / infection / etc so far this year. 3. CHF pathway 4. Will leave on home meds for the moment, got 20mg  IV lasix x1 in ED 5. Until CHF team can evaluate and make recs, as she sees Dr. [name] for advanced CHF. 6. Have sent message to P. Abagail Kitchens for cards / CHF team evaluation in AM 7. Day team to consider CVTS consult to see if they have anything to offer since pleural effusion reoccurred despite pleurodesis 2. SCLC - 1. Dx Back in 2015 2. No evidence of tumor on most recent 09/23/18 CT scan 3. Cytology on pleural fluid ordered 4. But as noted, has been  coming back negative thus far 3. EF 20%, CAD S/P CABG - 1. Continue ASA / PLAVIX 4. Acute on chronic respiratory failure with hypoxia - 1. Stabilized on BIPAP as noted above 2. Treatment for pleural effusion and CHF as discussed above. 5. PAF - 1. ASA/Plavix 2. Not on anticoagulation due to h/o SCLC  DVT prophylaxis: Lovenox Code Status: Full Family Communication: No family in room Disposition Plan: Home after admit Consults called: Message sent to P. Trent for cards / CHF eval in AM Admission status: Admit to inpatient  Severity of Illness: The appropriate patient status for this patient is INPATIENT. Inpatient status is judged to be reasonable and necessary in order to provide the required intensity of service to ensure the patient's safety. The patient's presenting symptoms, physical exam findings, and initial radiographic and laboratory data in the context of their chronic comorbidities is felt to place them at high risk for further clinical deterioration. Furthermore, it is not anticipated that the patient will be medically stable for discharge from the hospital within 2 midnights of admission. The following factors support the patient status of inpatient.   " The patient's presenting symptoms include Respiratory distress. " The worrisome physical exam findings include requiring BIPAP. " The initial radiographic and laboratory data are worrisome because of BNP > 2400, . " The chronic co-morbidities include CHF EF 20%, recurrent R pleural effusion, SLCL in remission.   * I certify that at the point of admission it is my clinical judgment that the patient will require inpatient hospital care spanning beyond 2 midnights from the point of admission due to high intensity of service, high risk for further deterioration and high frequency of surveillance required.Etta Quill DO [facility] Pager [phone] Only works nights!  If 7AM-7PM, please contact the primary  day team physician taking care of patient  [email or web address] Password [email or web address]  11/24/2018, 6:39 AM

## 2018-11-24 NOTE — ED Triage Notes (Signed)
Brought by ems from home for c/o SOB.  Hx of CHF and COPD.  GIven Solumedrol 125mg , Magnesium 2 gm, NTG SL X 1 and Duoneb en route.  Placed on CPAP.  Per ems some improvement.

## 2018-11-24 NOTE — ED Notes (Signed)
Pt refused to take morning medications until she eats due to stomach upset.

## 2018-11-24 NOTE — ED Notes (Signed)
RT at the bedside placing patient on CPAP at this time.

## 2018-11-25 DIAGNOSIS — I480 Paroxysmal atrial fibrillation: Secondary | ICD-10-CM

## 2018-11-25 DIAGNOSIS — J90 Pleural effusion, not elsewhere classified: Secondary | ICD-10-CM

## 2018-11-25 DIAGNOSIS — J9601 Acute respiratory failure with hypoxia: Secondary | ICD-10-CM

## 2018-11-25 LAB — BASIC METABOLIC PANEL
Anion gap: 14 (ref 5–15)
BUN: 26 mg/dL — ABNORMAL HIGH (ref 6–20)
CO2: 26 mmol/L (ref 22–32)
Calcium: 9.3 mg/dL (ref 8.9–10.3)
Chloride: 99 mmol/L (ref 98–111)
Creatinine, Ser: 1.31 mg/dL — ABNORMAL HIGH (ref 0.44–1.00)
GFR calc Af Amer: 52 mL/min — ABNORMAL LOW (ref >60)
GFR calc non Af Amer: 45 mL/min — ABNORMAL LOW (ref >60)
Glucose, Bld: 122 mg/dL — ABNORMAL HIGH (ref 70–99)
Potassium: 3.3 mmol/L — ABNORMAL LOW (ref 3.5–5.1)
Sodium: 139 mmol/L (ref 135–145)

## 2018-11-25 LAB — GLUCOSE, CAPILLARY
GLUCOSE-CAPILLARY: 187 mg/dL — AB (ref 70–99)
Glucose-Capillary: 112 mg/dL — ABNORMAL HIGH (ref 70–99)
Glucose-Capillary: 127 mg/dL — ABNORMAL HIGH (ref 70–99)
Glucose-Capillary: 129 mg/dL — ABNORMAL HIGH (ref 70–99)
Glucose-Capillary: 140 mg/dL — ABNORMAL HIGH (ref 70–99)
Glucose-Capillary: 149 mg/dL — ABNORMAL HIGH (ref 70–99)
Glucose-Capillary: 154 mg/dL — ABNORMAL HIGH (ref 70–99)
Glucose-Capillary: 195 mg/dL — ABNORMAL HIGH (ref 70–99)
Glucose-Capillary: 235 mg/dL — ABNORMAL HIGH (ref 70–99)

## 2018-11-25 LAB — MAGNESIUM: Magnesium: 2 mg/dL (ref 1.7–2.4)

## 2018-11-25 MED ORDER — FUROSEMIDE 10 MG/ML IJ SOLN
60.0000 mg | Freq: Two times a day (BID) | INTRAMUSCULAR | Status: AC
  Administered 2018-11-25: 60 mg via INTRAVENOUS
  Filled 2018-11-25: qty 6

## 2018-11-25 MED ORDER — POTASSIUM CHLORIDE CRYS ER 20 MEQ PO TBCR
20.0000 meq | EXTENDED_RELEASE_TABLET | Freq: Once | ORAL | Status: AC
  Administered 2018-11-25: 20 meq via ORAL
  Filled 2018-11-25: qty 1

## 2018-11-25 MED ORDER — POTASSIUM CHLORIDE CRYS ER 20 MEQ PO TBCR
40.0000 meq | EXTENDED_RELEASE_TABLET | Freq: Every day | ORAL | Status: DC
  Administered 2018-11-25 – 2018-11-26 (×2): 40 meq via ORAL
  Filled 2018-11-25 (×3): qty 2

## 2018-11-25 MED ORDER — ALBUTEROL SULFATE (2.5 MG/3ML) 0.083% IN NEBU
2.5000 mg | INHALATION_SOLUTION | Freq: Four times a day (QID) | RESPIRATORY_TRACT | Status: DC | PRN
  Administered 2018-11-25: 2.5 mg via RESPIRATORY_TRACT
  Filled 2018-11-25: qty 3

## 2018-11-25 NOTE — [place] Notes (Signed)
Patient complaining of labored breathing and increased shortness of breath at rest. Patient oxygen level of 98% on 2L [place] and RR of 22. Patient very anxious and requesting prn xanax. Writer spoke with Dr. Candiss Norse at [phone] and got the OK to give xanax earlier than scheduled. Dr. Candiss Norse also gave verbal order to administer evening dose of lasix. Continuing to monitor throughout shift.

## 2018-11-25 NOTE — Plan of Care (Signed)
Patient has no complaints of shortness of breath at rest. Oxygen levels remain 95-100% on 2L [place].

## 2018-11-25 NOTE — Consult Note (Addendum)
Advanced Heart Failure Team Consult Note   Primary Physician: Maude Leriche, PA-C PCP-Cardiologist:  Glori Bickers, MD  Reason for Consultation: A/C systolic CHF  HPI:    Brittany [name] is seen today for evaluation of A/C systolic CHF at the request of Dr. Candiss Norse.   Brittany [name] is a 57 y.o. female with a history of PAF,preveious smoker quit 4 years ago, hypertension, previous small cell lung cancer treated with chemo, chest XRT and prophylacticbrain radiation in 2015, family history of premature CAD, chronic systolic CHF s/p St Jude ICD, and recurrent pleural effusions s/p mechanical pleurodesis at [facility] 09/09/18.   Recently returned to [facility] after a long stay in [place] from June to November 2019.  She states she was in and out of the hospital her entire stay there due to recurrent pleural effusions and spent a total of ~ 6 weeks between [facility] and [facility]. She had a pleurx cath at one point, and mechanical pleurodesis performed 09/09/18. Echo around that time (08/2018) with EF 20%.   Last seen in CHF clinic 11/15/18. Thought to be compensated but tenuous. Spiro increase. CXR that day back in CHF clinic 11/15/18 showed slightly increase small R pleural effusions and R lower lung atelectasis.  Admitted 11/24/18 with worsening SOB over 2-3 days that worsened overnight. Of note, she had a large meal of pizza where she ate 5 slices leading up to this. She c/o orthopnea and acutely worsening SOB, and was worried her pleural effusion had worsened, so called EMS. CXR in the ED showed recurrent, partially loculated R pleural effusion, with progression since visit in HF clinic 11/15/18. IR evaluated and with small volume (around 20 cc) they were unable to drain pleural effusion. Findings discussed with Dr. Roxy Manns, and thought to be more CHF than effusion, and not surgical candidate with recent mechanical pleurodesis. Pertinent labs on admission included Cr 0.92, K 3.6, BNP  2500, Troponin 0.03, Hgb 11.6, and WBC 8.2.  She is feeling somewhat better today, but remains mildly SOB with activity around room this am. She states she was feeling her USOH up until yesterday, where she got [employer] pizza for lunch. She ate half the pizza (> 3500 mg of sodium) and within several hours felt ill and SOB was much worse than baseline. She denies dizziness, lightheadedness, fever, chills, or running out of her medications.   Cardiac Studies Echo 09/04/18 ([facility]) LVEF 20%, Moderate AI, Mild MR, Mild TR, Severe LAE, RV mildly decreased,   RHC 05/09/18 RA = 8 RV = 49/9 PA = 47/20 (32) PCW = 19 Fick cardiac output/index = 4.3/3.1 PVR = 3.0 wu AO sat = 93% PA sat = 62%, 64% Assessment: 1. Minimally elevated left heart pressures 2. Mild PAH 3. Normal cardiac output  Review of Systems: [y] = yes, [ ]  = no   General: Weight gain [y]; Weight loss [ ] ; Anorexia [ ] ; Fatigue [y]; Fever [ ] ; Chills [ ] ; Weakness [ ]   Cardiac: Chest pain/pressure [ ] ; Resting SOB [y]; Exertional SOB [y]; Orthopnea [y]; Pedal Edema [ ] ; Palpitations [ ] ; Syncope [ ] ; Presyncope [ ] ; Paroxysmal nocturnal dyspnea[ ]   Pulmonary: Cough [ ] ; Wheezing[ ] ; Hemoptysis[ ] ; Sputum [ ] ; Snoring [ ]   GI: Vomiting[ ] ; Dysphagia[ ] ; Melena[ ] ; Hematochezia [ ] ; Heartburn[ ] ; Abdominal pain [ ] ; Constipation [ ] ; Diarrhea [ ] ; BRBPR [ ]   GU: Hematuria[ ] ; Dysuria [ ] ; Nocturia[ ]   Vascular: Pain in legs with walking [ ] ;  Pain in feet with lying flat [ ] ; Non-healing sores [ ] ; Stroke [ ] ; TIA [ ] ; Slurred speech [ ] ;  Neuro: Headaches[ ] ; Vertigo[ ] ; Seizures[ ] ; Paresthesias[ ] ;Blurred vision [ ] ; Diplopia [ ] ; Vision changes [ ]   Ortho/Skin: Arthritis [y]; Joint pain [y]; Muscle pain [ ] ; Joint swelling [ ] ; Back Pain [ ] ; Rash [ ]   Psych: Depression[ ] ; Anxiety[ ]   Heme: Bleeding problems [ ] ; Clotting disorders [ ] ; Anemia [ ]   Endocrine: Diabetes [ ] ; Thyroid dysfunction[ ]   Home  Medications Prior to Admission medications   Medication Sig Start Date End Date Taking? Authorizing Provider  ALPRAZolam (XANAX) 0.25 MG tablet Take 0.25 mg by mouth every 8 (eight) hours as needed. for anxiety 11/10/18  Yes [provider]  aspirin 81 MG EC tablet Take 1 tablet (81 mg total) by mouth daily. 07/03/18  Yes Rayshun Kandler, Shaune Pascal, MD  atorvastatin (LIPITOR) 80 MG tablet Take 1 tablet (80 mg total) by mouth daily at 6 PM. 07/03/18  Yes Danila Eddie, Shaune Pascal, MD  clopidogrel (PLAVIX) 75 MG tablet Take 1 tablet (75 mg total) by mouth daily. 07/03/18  Yes Mirinda Monte, Shaune Pascal, MD  ipratropium-albuterol (DUONEB) 0.5-2.5 (3) MG/3ML SOLN Take 3 mLs by nebulization 2 (two) times daily. 05/23/18  Yes Marshia Tropea, Shaune Pascal, MD  ivabradine (CORLANOR) 7.5 MG TABS tablet Take 1 tablet (7.5 mg total) by mouth 2 (two) times daily with a meal. Patient taking differently: Take 11.25 mg by mouth 2 (two) times daily with a meal.  07/03/18  Yes Felcia Huebert, Shaune Pascal, MD  nitroGLYCERIN (NITROSTAT) 0.4 MG SL tablet Place 1 tablet (0.4 mg total) under the tongue every 5 (five) minutes as needed for chest pain. 05/23/18  Yes [name] Sandy, Shaune Pascal, MD  spironolactone (ALDACTONE) 25 MG tablet Take 1 tablet (25 mg total) by mouth daily. 11/15/18  Yes Gayathri Futrell, Shaune Pascal, MD  torsemide (DEMADEX) 20 MG tablet Take 60 mg by mouth daily.   Yes [provider]  oxyCODONE (OXY IR/ROXICODONE) 5 MG immediate release tablet Take 5 mg by mouth every 6 (six) hours as needed. for pain 10/04/18   [provider]    Past Medical History: Past Medical History:  Diagnosis Date  . Acute systolic congestive heart failure ([place]) 02/03/2018  . Allergy   . Anxiety   . Asthma   . Hypertension   . PAF (paroxysmal atrial fibrillation) ([place])   . Prophylactic measure 08/03/14-08/19/14   Prophyl. cranial radiation 24 Gy  . S/P emergency CABG x 3 02/03/2018   LIMA to LAD, SVG to D1, SVG to OM1, EVH via right thigh with  implantation of Impella LD LVAD via direct aortic approach  . Small cell lung cancer ([place]) 03/16/2014    Past Surgical History: Past Surgical History:  Procedure Laterality Date  . CESAREAN SECTION    . CORONARY ARTERY BYPASS GRAFT N/A 02/03/2018   Procedure: CORONARY ARTERY BYPASS GRAFTING (CABG);  Surgeon: Rexene Alberts, MD;  Location: [place];  Service: Open Heart Surgery;  Laterality: N/A;  Time 3 using left internal mammary artery and endoscopically harvested right saphenous vein  . CORONARY BALLOON ANGIOPLASTY N/A 02/03/2018   Procedure: CORONARY BALLOON ANGIOPLASTY;  Surgeon: Martinique, Peter M, MD;  Location: [place] CV [facility];  Service: Cardiovascular;  Laterality: N/A;  . CORONARY/GRAFT ACUTE MI REVASCULARIZATION N/A 02/03/2018   Procedure: Coronary/Graft Acute MI Revascularization;  Surgeon: Martinique, Peter M, MD;  Location: [place] [facility];  Service: Cardiovascular;  Laterality: N/A;  . IABP INSERTION N/A 02/03/2018   Procedure: IABP Insertion;  Surgeon: Martinique, Peter M, MD;  Location: [place] [facility];  Service: Cardiovascular;  Laterality: N/A;  . INTRAOPERATIVE TRANSESOPHAGEAL ECHOCARDIOGRAM N/A 02/03/2018   Procedure: INTRAOPERATIVE TRANSESOPHAGEAL ECHOCARDIOGRAM;  Surgeon: Rexene Alberts, MD;  Location: [place];  Service: Open Heart Surgery;  Laterality: N/A;  . LEFT HEART CATH AND CORONARY ANGIOGRAPHY N/A 02/03/2018   Procedure: LEFT HEART CATH AND CORONARY ANGIOGRAPHY;  Surgeon: Martinique, Peter M, MD;  Location: [place] [facility];  Service: Cardiovascular;  Laterality: N/A;  . MEDIASTINOSCOPY N/A 03/11/2014   Procedure: MEDIASTINOSCOPY;  Surgeon: Melrose Nakayama, MD;  Location: [place];  Service: Thoracic;  Laterality: N/A;  . PLACEMENT OF [place] LEFT VENTRICULAR ASSIST DEVICE  02/03/2018   Procedure: PLACEMENT OF [place] LEFT VENTRICULAR ASSIST DEVICE LD;  Surgeon: Rexene Alberts, MD;  Location: [place];  Service: Open Heart Surgery;;  . REMOVAL OF [place] LEFT VENTRICULAR  ASSIST DEVICE N/A 02/08/2018   Procedure: REMOVAL OF [place] LEFT VENTRICULAR ASSIST DEVICE;  Surgeon: Rexene Alberts, MD;  Location: [place];  Service: Open Heart Surgery;  Laterality: N/A;  . RIGHT HEART CATH N/A 02/03/2018   Procedure: RIGHT HEART CATH;  Surgeon: Martinique, Peter M, MD;  Location: [place] [facility];  Service: Cardiovascular;  Laterality: N/A;  . RIGHT HEART CATH N/A 05/09/2018   Procedure: RIGHT HEART CATH;  Surgeon: Jolaine Artist, MD;  Location: [name] [facility];  Service: Cardiovascular;  Laterality: N/A;  . TEE WITHOUT CARDIOVERSION N/A 02/08/2018   Procedure: TRANSESOPHAGEAL ECHOCARDIOGRAM (TEE);  Surgeon: Rexene Alberts, MD;  Location: [place];  Service: Open Heart Surgery;  Laterality: N/A;  . TUBAL LIGATION    . VIDEO BRONCHOSCOPY WITH ENDOBRONCHIAL ULTRASOUND N/A 03/11/2014   Procedure: VIDEO BRONCHOSCOPY WITH ENDOBRONCHIAL ULTRASOUND;  Surgeon: Melrose Nakayama, MD;  Location: [facility] OR;  Service: Thoracic;  Laterality: N/A;    Family History: Family History  Problem Relation Age of Onset  . Heart disease Mother   . Hypertension Mother   . Heart attack Mother   . Hypertension Maternal Grandmother   . Cancer Maternal Grandmother   . Diabetes Paternal Grandmother   . Stroke Neg Hx     Social History: Social History   Socioeconomic History  . Marital status: Married    Spouse name: Not on file  . Number of children: Not on file  . Years of education: Not on file  . Highest education level: Not on file  Occupational History  . Not on file  Social Needs  . Financial resource strain: Not on file  . Food insecurity:    Worry: Not on file    Inability: Not on file  . Transportation needs:    Medical: Not on file    Non-medical: Not on file  Tobacco Use  . Smoking status: Former Smoker    Packs/day: 1.00    Years: 20.00    Pack years: 20.00    Types: Cigarettes    Last attempt to quit: 03/13/2014    Years since quitting: 4.7  . Smokeless tobacco:  Never Used  Substance and Sexual Activity  . Alcohol use: Yes    Alcohol/week: 8.0 standard drinks    Types: 8 Glasses of wine per week  . Drug use: No  . Sexual activity: Never  Lifestyle  . Physical activity:    Days per week: Not on file    Minutes per session:  Not on file  . Stress: Not on file  Relationships  . Social connections:    Talks on phone: Not on file    Gets together: Not on file    Attends religious service: Not on file    Active member of club or organization: Not on file    Attends meetings of clubs or organizations: Not on file    Relationship status: Not on file  Other Topics Concern  . Not on file  Social History Narrative  . Not on file    Allergies:  Allergies  Allergen Reactions  . Codeine Nausea And Vomiting    Objective:    Vital Signs:   Temp:  [98 F (36.7 C)-98.8 F (37.1 C)] 98 F (36.7 C) (12/16 0436) Pulse Rate:  [74-80] 75 (12/16 0436) Resp:  [16-23] 16 (12/16 0436) BP: (106-140)/(61-77) 112/70 (12/16 0436) SpO2:  [98 %-100 %] 98 % (12/16 0755) Weight:  [46.1 kg-48.3 kg] 46.1 kg (12/16 0500) Last BM Date: 11/24/18  Weight change: Filed Weights   11/24/18 0420 11/24/18 1239 11/25/18 0500  Weight: 46.4 kg 48.3 kg 46.1 kg    Intake/Output:   Intake/Output Summary (Last 24 hours) at 11/25/2018 0848 Last data filed at 11/25/2018 0440 Gross per 24 hour  Intake 240 ml  Output 1150 ml  Net -910 ml      Physical Exam    General: NAD. Small status.  HEENT: normal Neck: supple. JVP at least 10 cm. Carotids 2+ bilat; no bruits. No lymphadenopathy or thyromegaly appreciated. Cor: PMI nondisplaced. Regular rate & rhythm. No rubs, gallops or murmurs. Lungs: clear Abdomen: soft, nontender, nondistended. No hepatosplenomegaly. No bruits or masses. Good bowel sounds. Extremities: no cyanosis, clubbing, rash, edema Neuro: alert & orientedx3, cranial nerves grossly intact. moves all 4 extremities w/o difficulty. Affect  pleasant   Telemetry   NSR 70-80s, personally reviewed.  EKG    NSR 93s 11/24/18, personally reviewed  Labs   Basic Metabolic Panel: Recent Labs  Lab 11/24/18 0345 11/25/18 0403  NA 141 139  K 3.6 3.3*  CL 106 99  CO2 18* 26  GLUCOSE 251* 122*  BUN 12 26*  CREATININE 0.92 1.31*  CALCIUM 9.5 9.3  MG 3.8* 2.0    Liver Function Tests: No results for input(s): AST, ALT, ALKPHOS, BILITOT, PROT, ALBUMIN in the last 168 hours. No results for input(s): LIPASE, AMYLASE in the last 168 hours. No results for input(s): AMMONIA in the last 168 hours.  CBC: Recent Labs  Lab 11/24/18 0345  WBC 8.2  HGB 11.6*  HCT 42.0  MCV 93.3  PLT 237    Cardiac Enzymes: Recent Labs  Lab 11/24/18 0345  TROPONINI 0.03*    BNP: BNP (last 3 results) Recent Labs    04/19/18 1218 05/05/18 0142 11/24/18 0345  BNP 1,663.5* 2,601.1* 2,479.2*    ProBNP (last 3 results) No results for input(s): PROBNP in the last 8760 hours.   CBG: Recent Labs  Lab 11/24/18 1717 11/24/18 2005 11/24/18 2149 11/24/18 2345 11/25/18 0754  GLUCAP 187* 195* 235* 154* 127*    Coagulation Studies: No results for input(s): LABPROT, INR in the last 72 hours.   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 Patient now presents for attempted ultrasound-guided thoracentesis. EXAM: CHEST ULTRASOUND COMPARISON:  Chest radiograph-11/24/2018; 11/15/2018; 05/10/2018; ultrasound-guided right-sided thoracentesis-05/05/2018 FINDINGS: Sonographic evaluation performed by the dictating  interventional radiologist demonstrates small markedly loculated right-sided pleural effusion with interposed pulmonary parenchyma not amenable to ultrasound-guided thoracentesis. IMPRESSION: Small markedly loculated right-sided pleural effusion, not amenable to ultrasound-guided thoracentesis. Further evaluation with contrast-enhanced chest CT could as indicated. Electronically Signed   By: Sandi Mariscal M.D.   On: 11/24/2018 10:01      Medications:     Current Medications: . aspirin EC  81 mg Oral Daily  . atorvastatin  80 mg Oral q1800  . clopidogrel  75 mg Oral Daily  . enoxaparin (LOVENOX) injection  40 mg Subcutaneous Q24H  . furosemide  60 mg Intravenous BID  . insulin aspart  0-9 Units Subcutaneous Q4H  . ipratropium-albuterol  3 mL Nebulization BID  . ivabradine  7.5 mg Oral BID WC  . potassium chloride  40 mEq Oral Daily  . spironolactone  25 mg Oral Daily     Infusions:     Patient Profile   Brittany [name] is a 57 y.o. female with a history of PAF,preveious smoker quit 4 years ago, hypertension, previous small cell lung cancer treated with chemo, chest XRT and prophylacticbrain radiation in 2015, family history of premature CAD, chronic systolic CHF s/p St Jude ICD, and recurrent pleural effusions s/p mechanical pleurodesis at [facility] 09/09/18.   Admitted 03/50/09 with A/C systolic CHF in setting of dietary non-compliance.   Assessment/Plan   1. Chronic Systolic HF  H/O cardiogenic shock in setting of #1: TEE 02/03/18 LVEF 20%, Mild to Mod AR, Mild/Mod MR, RV normal. Impella out 3/1.  - Echo 02/2018 EF 25-30% with moderate-severe aortic insufficiency which is likely contributing to CHF.  - Echo 04/2018: EF 20%, mild to mod AI, mild MR - Echo 09/04/18 ([facility]) LVEF 20%, Moderate AI, Mild MR, Mild TR, Severe LAE, RV mildly decreased.  - Now s/p Medtronic single-chamber ICD. - Volume status remains elevated. Will continue IV lasix 60 mg BID today and follow.  - No BB  with recent low output.  - She is off digoxin and losartan since last visit. No ACE/ARB today with AKI.  - Continue spiro 25 mg daily. - Continue corlanor 7.5 mg BID. No BP room for medication changes.  - Has been referred to CR. Continue paramedicine.  - Reinforced fluid restriction to < 2 L daily, sodium restriction to less than 2000 mg daily, and the importance of daily weights.    2. Recurrent pleural effusions - Multiple admission this year in [place] and [place]. She had a Pleurx for a time, and underwent 'mechanical' pleurodesis 09/09/18. - CT chest 09/23/18 as above. This is the most recent imaging available. - CXR on admit with some progression from 12/6. Taken to IR for [place] and noted to have small, markedly loculated R pleural effusion. Not amenable to US Thoracentesis, or pleurx.  - As above, suspect primary issue was A/C CHF in setting of dietary non-compliance.  3. Hypoxic respiratory failure - Uses O2 PRN. No change.    4. 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 Cath with severe 2v CAD as above with severe LV dysfunction/ICM. s/p Emergent CABG 02/03/18.  - No s/s of ischemia.    - Continue plavix, asa, atorvastatin.    5. PAF:  CHA2DS2/VASc is at least 4. (CHF, Vasc  disease, HTN, Female).  - She has history of Afib RVR in the past in the setting of her Lung CA. Previously on Xarelto but stopped her medications.  - Will continue ASA and Plavix. No anticoagulation with h/o lung cancer. - NSR this admit.   6. 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  - Chest CT 02/19/18 with mass-like consolidation in R hilum concerning for recurrent tumor.  -Repeat CT 03/27/2018 -No definite findings of locally recurrent tumor in the right lung. 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 Continued chest CT surveillance is advised in 3-6 months. - S/p thoracentesis 04/2018. Cytology  negative for malignancy.  - CT chest 09/23/18 with slight increase in size of loculated right hydropneumothorax, status post thoracostomy tube removal. No evidence of tumor noted.  - No change to current plan.    7. Aortic insufficiency: Moderate-severe on postop echo. Likely potentiating CHF.  - Moderate on Echo at [facility] 08/2018. No change.  8. AKI - Cr 0.9 -> 1.2 with diuresis. Suspect can transition back to po meds in tomorrow.   Medication concerns reviewed with patient and pharmacy team. Barriers identified: None at this time.   Length of Stay: 1  Annamaria Helling  11/25/2018, 8:48 AM  Advanced Heart Failure Team Pager [phone] (M-F; 7a - 4p)  Please contact [facility] for night-coverage after hours (4p -7a ) and weekends on [email or web address]  Patient seen and examined with the above-signed Advanced Practice Provider and/or Housestaff. I personally reviewed laboratory data, imaging studies and relevant notes. I independently examined the patient and formulated the important aspects of the plan. I have edited the note to reflect any of my changes or salient points. I have personally discussed the plan with the patient and/or family.  57 y/o woman with complicated cardiac situation with advanced systolic HF EF 26% c/b moderate to severe AI and recurrent R pleural effusion s/p mechanical [facility]urodesis at [facility] recently. Admitted with recurrent HF symptoms and mild reaccummualtion of R pleural effusion in the setting of dietary indiscretion. Has been seen by TCTS and IR and effusion is small and not amenable to thoracentesis. Now responding well to IV diuresis although creatinine has bumped mildly.   On exam still volume overloaded with JVP to jaw. Cor RRR no s3. Mildly decreased BS in right base.   Would continue IV diuresis one more day. Possible home tomorrow if volume status improved and renal function relatively stable. /  Glori Bickers, MD  9:34 AM

## 2018-11-25 NOTE — Evaluation (Signed)
Physical Therapy Evaluation Patient Details Name: Brittany [name] MRN: [id number] DOB: [birth year] Today's Date: 11/25/2018   History of Present Illness  Brittany [name] is a 57 y.o. female with a history of PAF,  preveious smoker quit 4 years ago, hypertension, previous small cell lung cancer treated with chemo, chest XRT and prophylacticbrain radiation in 2015, family history of premature CAD, chronic systolic CHF s/p [employer] Jude ICD, and recurrent pleural effusions s/p mechanical pleurodesis at [facility] 09/09/18.  Admitted with SOB and return of R pleural effusion.  Clinical Impression  Pt admitted with above diagnosis. Pt currently with functional limitations due to the deficits listed below (see PT Problem List). Pt ambulated within room and 50' out of room on RA with RW for energy conservation. At that point, she had 3/4 DOE and needed standing rest break, O2 sats monitor would not pick up sat which generally means sats are low. 3L O2 placed for rest of ambulation and pt felt much better, able to converse and sats 94% upon arrival back to room. Pt will benefit from skilled PT to increase their independence and safety with mobility to allow discharge to the venue listed below.       Follow Up Recommendations No PT follow up    Equipment Recommendations  None recommended by PT    Recommendations for Other Services       Precautions / Restrictions Precautions Precautions: Other (comment) Precaution Comments: watch O2 sats Restrictions Weight Bearing Restrictions: No      Mobility  Bed Mobility Overal bed mobility: Needs Assistance Bed Mobility: Supine to Sit     Supine to sit: Supervision     General bed mobility comments: slow moving but no physical assist needed  Transfers Overall transfer level: Needs assistance Equipment used: Rolling walker (2 wheeled);None Transfers: Sit to/from Stand Sit to Stand: Supervision         General transfer comment:  supervision for safety  Ambulation/Gait Ambulation/Gait assistance: Supervision Gait Distance (Feet): 170 Feet Assistive device: Rolling walker (2 wheeled);None Gait Pattern/deviations: Step-through pattern Gait velocity: decreased Gait velocity interpretation: 1.31 - 2.62 ft/sec, indicative of limited community ambulator General Gait Details: pt ambulated first 42' on RA and O2 sat monitor could not pick up O2 sat but pt with labored breathing, 3/4 DOE and assume that sats were low. Placed 3L O2 at that point and pt able to converse rest of ambulation and O2 sats 94% back in room. Left on 2L O2 with O2 sats 96% and HR 94 bpm  Stairs            Wheelchair Mobility    Modified Rankin (Stroke Patients Only)       Balance Overall balance assessment: No apparent balance deficits (not formally assessed)                                           Pertinent Vitals/Pain Pain Assessment: No/denies pain    Home Living Family/patient expects to be discharged to:: Private residence Living Arrangements: Other relatives Available Help at Discharge: Family;Available PRN/intermittently Type of Home: Apartment Home Access: Level entry     Home Layout: Two level;1/2 bath on main level Home Equipment: Walker - 2 wheels Additional Comments: lives in townhome with sister, sleeps downstairs, sponge baths    Prior Function Level of Independence: Needs assistance   Gait / Transfers Assistance Needed: has been ambulating independently  but needs assist with home mgmt and has not been driving since last winter   ADL's / Homemaking Assistance Needed: had been sponge bathing  Comments: used walker outside of home only     Hand Dominance   Dominant Hand: Right    Extremity/Trunk Assessment   Upper Extremity Assessment Upper Extremity Assessment: Overall WFL for tasks assessed    Lower Extremity Assessment Lower Extremity Assessment: Overall WFL for tasks assessed     Cervical / Trunk Assessment Cervical / Trunk Assessment: Normal  Communication   Communication: No difficulties  Cognition Arousal/Alertness: Awake/alert Behavior During Therapy: WFL for tasks assessed/performed Overall Cognitive Status: Within Functional Limits for tasks assessed                                        General Comments General comments (skin integrity, edema, etc.): pt used O2 prn at home and did not use it for shopping as she only has large tank. Advised her that she needs to have O2 on for activity at all times for now    Exercises     Assessment/Plan    PT Assessment Patient needs continued PT services  PT Problem List Decreased activity tolerance;Decreased mobility       PT Treatment Interventions DME instruction;Gait training;Functional mobility training;Therapeutic exercise;Therapeutic activities;Patient/family education    PT Goals (Current goals can be found in the Care Plan section)  Acute Rehab PT Goals Patient Stated Goal: breathe better, go home PT Goal Formulation: With patient Time For Goal Achievement: 12/09/18 Potential to Achieve Goals: Good    Frequency Min 3X/week   Barriers to discharge        Co-evaluation               AM-PAC PT "6 Clicks" Mobility  Outcome Measure Help needed turning from your back to your side while in a flat bed without using bedrails?: None Help needed moving from lying on your back to sitting on the side of a flat bed without using bedrails?: None Help needed moving to and from a bed to a chair (including a wheelchair)?: None Help needed standing up from a chair using your arms (e.g., wheelchair or bedside chair)?: None Help needed to walk in hospital room?: A Little Help needed climbing 3-5 steps with a railing? : A Little 6 Click Score: 22    End of Session Equipment Utilized During Treatment: Oxygen Activity Tolerance: Patient tolerated treatment well Patient left: in  chair;with call bell/phone within reach Nurse Communication: Mobility status PT Visit Diagnosis: Difficulty in walking, not elsewhere classified (R26.2)    Time: 3903-0092 PT Time Calculation (min) (ACUTE ONLY): 32 min   Charges:   PT Evaluation $PT Eval Moderate Complexity: 1 Mod PT Treatments $Gait Training: 8-22 mins        Leighton Roach, [place]  Pager [phone] Office [place] 11/25/2018, 2:05 PM

## 2018-11-25 NOTE — Progress Notes (Signed)
PROGRESS NOTE                                                                                                                                                                                                             Patient Demographics:    Brittany [name], is a 57 y.o. female, DOB - 11-16-1961, IRJ:[id number]  Admit date - 11/24/2018   Admitting Physician Thurnell Lose, MD  Outpatient Primary MD for the patient is Scifres, Durel Salts  LOS - 1  Chief Complaint  Patient presents with  . Shortness of Breath       Brief Narrative   Brittany [name] is a 57 y.o. female with medical history significant of CHF, EF 20% due to ICM, had emergent CABG in Feb this year.  SCLC back in 2015 that seems to be in remission as far as anyone knows or can tell.  Patient has been suffering from recurrent R sided pleural effusions earlier this year.  Had pleurex cath at one point and ultimately had mechanical pleurodesis 09/09/18 at [facility].  Throughout all of this it seems that the re-occurring effusion is due to CHF (repeated cytologies keep finding no malignant cells). She had been doing okay, and even saw Dr. [name] x9 days ago with fairly clear lungs on CXR at that time.  2 days ago she had some pizza and after that she has been acutely short of breath came to the ER where she was thought to have worsening of her right-sided transudative pleural effusion and she was admitted to the hospital.  Further work-up and history taking suggested that this might be more CHF than complications of her pleural effusion.   Subjective:   Patient in bed, appears comfortable, denies any headache, no fever, no chest pain or pressure, no shortness of breath , no abdominal pain. No focal weakness.    Assessment  & Plan :     1.  Acute on chronic hypoxic respiratory failure likely due to due to on chronic systolic CHF with underlying EF 20% and mild to moderate reoccurrence of  right-sided transudative pleural effusion.  Viewed patient's chart in detail also from [place], discussed her x-ray findings with cardiothoracic surgeon Dr.Owen.  Her pleural effusion does not seem to be the main issue, this seems to be dietary noncompliance related acute on chronic systolic CHF with EF around 20%.  She was initially placed on BiPAP, her main issue was fluid overload brought on by dietary noncompliance, she was aggressively diuresed  with good effect, she is now down to 1 to 2 L nasal cannula oxygen and symptom-free, she will be continued on ivabradine along with diuretics once her renal function has stabilized, counseled on dietary compliance, CHF team following, in the past she had low output issues and hypotension precluding the initiation of beta-blocker or ACE/ARB. [facility] CHF team.  2.  Recurrent right-sided loculated but transudative pleural effusion to be due to CHF.  S/p pleurodesis at [facility] a few months ago.  Not a surgical candidate at this time, supportive care as above.  3.  Small cell lung cancer.  In remission.  Multiple cytology from pleural effusion have been negative.  4.  COPD.  Stable.  Supportive care.  Quit smoking long time ago according to the patient.  5.  Paroxysmal atrial fibrillation.  Mali vas 2 score of at least 3.  Not on anticoagulation.  Continue dual antiplatelet agents along with rate controlling agents if needed PRN.  6.  CAD status post CABG.  Continue dual antiplatelet therapy for secondary prevention.  7.  ARF.  Skip a few Lasix doses, resume p.o. diuretics once renal function has stabilized, avoid ACE/arm for now.    Note from [facility] heart failure team on 09/26/2018  Recurrent transudative pleural effusion attributed to HF. She has had multiple hospitalizations for hypoxic respiratory failure related to recurrent pleural effusion with recent placement of Pleurex at OSH 9/20. RHC with very mildly elevated filling pressures. Consulted CTS who  recommended mechanical pleurodesis, which was performed on 9/30 with right chest tube placement. Unfortunately, post-operatively the patient continued to experience significant post-operative pain and had a persistent O2 requirement. Acute pain team consulted for management of post-operative pain. Briefly on ketamine and dilaudid PCA, then eventually required epidural placement as well as narcan drip for itching, PRN oxycodone for break through pain, tylenol, gabapentin and colchicine. CT chest 10/8 (noted below) which showed a decrease in the effusion. Epidural and narcan weaned off 10/9 and transitioned to oral oxycodone. Chest tube was ultimately pulled on 10/10. She continued to have constant rib and pleuritic pain throughout post op course, and was overall very frustrated and disappointed that she feels worse now than she did prior to the procedure, due to the pain and O2 requirement. Repeat CT on 10/13 showed slightly increased size of loculated hydropneumothorax, however CTS felt there was no further intervention to be done at this time. Would recommend continued aggressive chest PT, ambulation, Incentive spirometer, diuresis and conservative measures.    Family Communication  : None Present  Code Status : Full  Disposition Plan  : Telemetry  Consults  : Cardiothoracic surgery Dr.Owen over the phone, CHF team,  Procedures  :   Chest x-ray  Ultrasound-guided thoracentesis ordered on 11/24/2018 - not enough fluid on US  DVT Prophylaxis  :  Lovenox    Lab Results  Component Value Date   PLT 237 11/24/2018    Diet :  Diet Order            DIET SOFT Room service appropriate? Yes; Fluid consistency: Thin; Fluid restriction: 1200 mL Fluid  Diet effective now               Inpatient Medications Scheduled Meds: . aspirin EC  81 mg Oral Daily  . atorvastatin  80 mg Oral q1800  . clopidogrel  75 mg Oral Daily  . enoxaparin (LOVENOX) injection  40 mg Subcutaneous Q24H  . insulin  aspart  0-9 Units Subcutaneous Q4H  .  ipratropium-albuterol  3 mL Nebulization BID  . ivabradine  7.5 mg Oral BID WC  . potassium chloride  40 mEq Oral Daily  . spironolactone  25 mg Oral Daily   Continuous Infusions:  PRN Meds:.acetaminophen, ALPRAZolam, ondansetron (ZOFRAN) IV  Antibiotics  :   Anti-infectives (From admission, onward)   None        Objective:   Vitals:   11/24/18 2229 11/25/18 0436 11/25/18 0500 11/25/18 0755  BP:  112/70    Pulse:  75    Resp:  16    Temp:  98 F (36.7 C)    TempSrc:      SpO2: 98% 100%  98%  Weight:   46.1 kg   Height:        Wt Readings from Last 3 Encounters:  11/25/18 46.1 kg  11/15/18 46.4 kg  07/03/18 44.9 kg     Intake/Output Summary (Last 24 hours) at 11/25/2018 0849 Last data filed at 11/25/2018 0440 Gross per 24 hour  Intake 240 ml  Output 1150 ml  Net -910 ml     Physical Exam  Awake Alert, Oriented X 3, No new F.N deficits, Normal affect [place].AT,PERRAL Supple Neck,No JVD, No cervical lymphadenopathy appriciated.  Symmetrical Chest wall movement, Good air movement bilaterally, mild crackles RRR,No Gallops, Rubs or new Murmurs, No Parasternal Heave +ve B.Sounds, Abd Soft, No tenderness, No organomegaly appriciated, No rebound - guarding or rigidity. No Cyanosis, Clubbing or edema, No new Rash or bruise   Data Review:    CBC Recent Labs  Lab 11/24/18 0345  WBC 8.2  HGB 11.6*  HCT 42.0  PLT 237  MCV 93.3  MCH 25.8*  MCHC 27.6*  RDW 20.0*    Chemistries  Recent Labs  Lab 11/24/18 0345 11/25/18 0403  NA 141 139  K 3.6 3.3*  CL 106 99  CO2 18* 26  GLUCOSE 251* 122*  BUN 12 26*  CREATININE 0.92 1.31*  CALCIUM 9.5 9.3  MG 3.8* 2.0   ------------------------------------------------------------------------------------------------------------------ No results for input(s): CHOL, HDL, LDLCALC, TRIG, CHOLHDL, LDLDIRECT in the last 72 hours.  Lab Results  Component Value Date   HGBA1C 5.8 (H)  04/22/2018   ------------------------------------------------------------------------------------------------------------------ No results for input(s): TSH, T4TOTAL, T3FREE, THYROIDAB in the last 72 hours.  Invalid input(s): FREET3 ------------------------------------------------------------------------------------------------------------------ No results for input(s): VITAMINB12, FOLATE, FERRITIN, TIBC, IRON, RETICCTPCT in the last 72 hours.  Coagulation profile No results for input(s): INR, PROTIME in the last 168 hours.  No results for input(s): DDIMER in the last 72 hours.  Cardiac Enzymes Recent Labs  Lab 11/24/18 0345  TROPONINI 0.03*   ------------------------------------------------------------------------------------------------------------------    Component Value Date/Time   BNP 2,479.2 (H) 11/24/2018 0345    Micro Results No results found for this or any previous visit (from the past 240 hour(s)).  Radiology Reports Dg Chest 2 View  Result Date: 11/15/2018 CLINICAL DATA:  Recurrent RIGHT pleural effusion. EXAM: CHEST - 2 VIEW COMPARISON:  05/10/2018 FINDINGS: Cardiomegaly, CABG changes and LEFT ICD noted. A small RIGHT pleural effusion with RIGHT LOWER lung atelectasis have slightly increased. There is no evidence of pneumothorax or LEFT pleural effusion. No acute bony abnormalities are present. IMPRESSION: Slightly increasing small RIGHT pleural effusion and RIGHT LOWER lung atelectasis. Cardiomegaly Electronically Signed   By: Margarette Canada M.D.   On: 11/15/2018 16:39   [place] Chest (pleural Effusion)  Result Date: 11/24/2018 CLINICAL DATA:  Patient with complex past medical history significant for CHF requiring emergent CABG (02/03/2018 also was  remote history of small cell right-sided lung cancer in March 2015 with recurrent symptomatic pleural effusions ultimately requiring right-sided chest tube and reported right-sided presumed talc pleurodesis at outside  institution, most recently treated at [facility]). Patient now presents for attempted ultrasound-guided thoracentesis. EXAM: CHEST ULTRASOUND COMPARISON:  Chest radiograph-11/24/2018; 11/15/2018; 05/10/2018; ultrasound-guided right-sided thoracentesis-05/05/2018 FINDINGS: Sonographic evaluation performed by the dictating interventional radiologist demonstrates small markedly loculated right-sided pleural effusion with interposed pulmonary parenchyma not amenable to ultrasound-guided thoracentesis. IMPRESSION: Small markedly loculated right-sided pleural effusion, not amenable to ultrasound-guided thoracentesis. Further evaluation with contrast-enhanced chest CT could as indicated. Electronically Signed   By: Sandi Mariscal M.D.   On: 11/24/2018 10:01   Dg Chest Portable 1 View  Result Date: 11/24/2018 CLINICAL DATA:  Shortness of breath. EXAM: PORTABLE CHEST 1 VIEW COMPARISON:  Radiograph 11/15/2018. Most recent CT 04/20/2018 FINDINGS: Progression of right pleural effusion since prior exam, now partially loculated. Left-sided pacemaker in place. Post median sternotomy. Cardiomegaly is unchanged. Unchanged mediastinal contours with post treatment related changes at the right hilum. Suspect mild interstitial edema. Small left pleural effusion. No pneumothorax. IMPRESSION: 1. Progression of right pleural effusion over the past 9 days, now partially loculated. 2. Suspect mild interstitial edema. Small left pleural effusion. Electronically Signed   By: Keith Rake M.D.   On: 11/24/2018 04:53    Time Spent in minutes  30   Lala Lund M.D on 11/25/2018 at 8:49 AM  To page go to [email or web address] - password [email or web address]

## 2018-11-26 LAB — BASIC METABOLIC PANEL
Anion gap: 11 (ref 5–15)
BUN: 24 mg/dL — ABNORMAL HIGH (ref 6–20)
CO2: 27 mmol/L (ref 22–32)
Calcium: 9.3 mg/dL (ref 8.9–10.3)
Chloride: 101 mmol/L (ref 98–111)
Creatinine, Ser: 1.02 mg/dL — ABNORMAL HIGH (ref 0.44–1.00)
GFR calc Af Amer: >60 mL/min (ref >60)
GFR calc non Af Amer: >60 mL/min (ref >60)
Glucose, Bld: 109 mg/dL — ABNORMAL HIGH (ref 70–99)
Potassium: 3.8 mmol/L (ref 3.5–5.1)
Sodium: 139 mmol/L (ref 135–145)

## 2018-11-26 LAB — GLUCOSE, CAPILLARY
GLUCOSE-CAPILLARY: 102 mg/dL — AB (ref 70–99)
Glucose-Capillary: 141 mg/dL — ABNORMAL HIGH (ref 70–99)
Glucose-Capillary: 94 mg/dL (ref 70–99)

## 2018-11-26 MED ORDER — TORSEMIDE 20 MG PO TABS: 60.0000 mg | ORAL_TABLET | Freq: Every day | ORAL | 6 refills | Status: DC

## 2018-11-26 MED ORDER — TORSEMIDE 20 MG PO TABS
60.0000 mg | ORAL_TABLET | Freq: Every day | ORAL | Status: DC
  Administered 2018-11-26: 60 mg via ORAL
  Filled 2018-11-26: qty 3

## 2018-11-26 NOTE — Progress Notes (Addendum)
Advanced Heart Failure Rounding Note  PCP-Cardiologist: Glori Bickers, MD   Subjective:    Feeling much better. Weight down 5 lbs. Cr back to baseline. Anxious to go home.   Cr 0.92 -> 1.31 -> 1.02.  [place] Chest 11/24/18 have small, markedly loculated R pleural effusion. Not amenable to US Thoracentesis, or pleurx.   Echo 09/04/18 ([facility]) LVEF 20%, Moderate AI, Mild MR, Mild TR, Severe LAE, RV mildly decreased  Objective:   Weight Range: 46.1 kg Body mass index is 20.53 kg/m.   Vital Signs:   Temp:  [97.9 F (36.6 C)-100.2 F (37.9 C)] 100.2 F (37.9 C) (12/16 2123) Pulse Rate:  [86] 86 (12/16 2123) Resp:  [17-18] 18 (12/16 2123) BP: (106-113)/(66-74) 106/74 (12/16 2123) SpO2:  [98 %-100 %] 98 % (12/16 2123) Last BM Date: 11/24/18  Weight change: Filed Weights   11/24/18 0420 11/24/18 1239 11/25/18 0500  Weight: 46.4 kg 48.3 kg 46.1 kg    Intake/Output:   Intake/Output Summary (Last 24 hours) at 11/26/2018 0814 Last data filed at 11/25/2018 1742 Gross per 24 hour  Intake 360 ml  Output 600 ml  Net -240 ml      Physical Exam    General:  Well appearing. No resp difficulty HEENT: Normal Neck: Supple. JVP 6-7 cm. Carotids 2+ bilat; no bruits. No lymphadenopathy or thyromegaly appreciated. Cor: PMI nondisplaced. Regular rate & rhythm. No rubs, gallops or murmurs. Lungs: Clear Abdomen: Soft, nontender, nondistended. No hepatosplenomegaly. No bruits or masses. Good bowel sounds. Extremities: No cyanosis, clubbing, rash, edema Neuro: Alert & orientedx3, cranial nerves grossly intact. moves all 4 extremities w/o difficulty. Affect pleasant  Telemetry   NSR 70-80s, personally reviewed.  EKG    No new tracings.    Labs    CBC Recent Labs    11/24/18 0345  WBC 8.2  HGB 11.6*  HCT 42.0  MCV 93.3  PLT 322   Basic Metabolic Panel Recent Labs    11/24/18 0345 11/25/18 0403 11/26/18 0339  NA 141 139 139  K 3.6 3.3* 3.8  CL 106 99 101  CO2  18* 26 27  GLUCOSE 251* 122* 109*  BUN 12 26* 24*  CREATININE 0.92 1.31* 1.02*  CALCIUM 9.5 9.3 9.3  MG 3.8* 2.0  --    Liver Function Tests No results for input(s): AST, ALT, ALKPHOS, BILITOT, PROT, ALBUMIN in the last 72 hours. No results for input(s): LIPASE, AMYLASE in the last 72 hours. Cardiac Enzymes Recent Labs    11/24/18 0345  TROPONINI 0.03*    BNP: BNP (last 3 results) Recent Labs    04/19/18 1218 05/05/18 0142 11/24/18 0345  BNP 1,663.5* 2,601.1* 2,479.2*    ProBNP (last 3 results) No results for input(s): PROBNP in the last 8760 hours.   D-Dimer No results for input(s): DDIMER in the last 72 hours. Hemoglobin A1C No results for input(s): HGBA1C in the last 72 hours. Fasting Lipid Panel No results for input(s): CHOL, HDL, LDLCALC, TRIG, CHOLHDL, LDLDIRECT in the last 72 hours. Thyroid Function Tests No results for input(s): TSH, T4TOTAL, T3FREE, THYROIDAB in the last 72 hours.  Invalid input(s): FREET3  Other results:   Imaging     No results found.   Medications:     Scheduled Medications: . aspirin EC  81 mg Oral Daily  . atorvastatin  80 mg Oral q1800  . clopidogrel  75 mg Oral Daily  . enoxaparin (LOVENOX) injection  40 mg Subcutaneous Q24H  . insulin aspart  0-9 Units Subcutaneous Q4H  . ipratropium-albuterol  3 mL Nebulization BID  . ivabradine  7.5 mg Oral BID WC  . potassium chloride  40 mEq Oral Daily  . spironolactone  25 mg Oral Daily     Infusions:   PRN Medications:  acetaminophen, albuterol, ALPRAZolam, ondansetron (ZOFRAN) IV    Patient Profile   Brittany [name] is a 57 y.o. 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    Admitted 16/10/96 with A/C systolic CHF in setting of dietary non-compliance.   Assessment/Plan   1. Acute on Chronic Systolic HF  -Echo 0/[id number] 25-30% with moderate-severe aortic insufficiency which is likely contributing to CHF.  - Echo 04/2018: EF 20%, mild to mod AI, mild MR -Echo 09/04/18 ([facility]) LVEF 20%, Moderate AI, Mild MR, Mild TR, Severe LAE, RV mildly decreased.  - Now s/p Medtronicsingle-chamber ICD. - Volume status improved.  - Resume home torsemide 60 mg daily.  - No BB with recent low output.  -She is off digoxin and losartan since last visit.No ACE/ARB today with AKI.  -Continue spiro 25 mg daily. - Continue corlanor 7.5 mg BID. No BP room for medication changes.  - Has been referred to CR. Continue paramedicine.  - Reinforced fluid restriction to < 2 L daily, sodium restriction to less than 2000 mg daily, and the importance of daily weights.    2. Recurrent pleural effusions - Multiple admission this year in [place] and [place]. She had a Pleurx for a time, and underwent'mechanical'pleurodesis 09/09/18. - CT chest 09/23/18 as above. This is the most recent imaging available. - CXR on admit with some progression from 12/6. Taken to IR for [place] and noted to have small, markedly loculated R pleural effusion. Not amenable to US Thoracentesis, or pleurx.  - As above, suspect primary issue was A/C CHF in setting of dietary non-compliance. - No change to current plan.    3. Hypoxic respiratory failure -Uses O2PRN.No change.   4. 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 Cath with severe 2v CAD as above with severe LV dysfunction/ICM. s/p Emergent CABG 02/03/18. - No s/s of ischemia.    - Continue plavix, asa, atorvastatin.   5. PAF:  CHA2DS2/VASc is at least 4. (CHF, Vasc disease, HTN, Female).  - She has history of Afib RVR in the past in the setting of her Lung CA. Previously on Xarelto but stopped her medications.  - Will continue  ASA and Plavix. No anticoagulation with h/o lung cancer. -Has been in NSR this admit.   6. 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  - Chest CT 02/19/18 with mass-like consolidation in R hilum concerning for recurrent tumor.  -Repeat CT 03/27/2018 -No definite findings of locally recurrent tumor in the right lung. 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 Continued chest CT surveillance is advised in 3-6 months. - S/p thoracentesis 04/2018. Cytology negative for malignancy. - CT chest 09/23/18 with slight increase in size of loculated right hydropneumothorax, status post thoracostomy tube removal.No evidence of tumor noted. - No change to current plan.    7. Aortic insufficiency: Moderate-severe on postop echo. Likely potentiating CHF.  -Moderate on Echo at [name]. No  change.   8. AKI - Resolved with diuresis.   She is stable for home with close follow up. Counseled extensively on dietary sodium restrictions.   HF Medication Recommendations for Home: Torsemide 60 mg daily Spironolactone 25 mg daily Corlanor 7.5 mg BID  Follow up as an outpatient will be placed in chart.   Medication concerns reviewed with patient and pharmacy team. Barriers identified: None at this time.   Length of Stay: 2  Shirley Friar, PA-C  11/26/2018, 8:14 AM  Advanced Heart Failure Team Pager [phone] (M-F; 7a - 4p)  Please contact [facility] for night-coverage after hours (4p -7a ) and weekends on [email or web address]  Patient seen and examined with the above-signed Advanced Practice Provider and/or Housestaff. I personally reviewed laboratory data, imaging studies and relevant notes. I independently examined the patient and formulated the important aspects of the plan. I have edited the note to reflect any of my changes or salient points. I have personally discussed the plan with the patient and/or  family.  She is ready for d/c. Agree with above meds. Will need close f/u in HF program.   Glori Bickers, MD  9:19 AM

## 2018-11-26 NOTE — Progress Notes (Signed)
Brittany [name] discharged Home per MD order.  Discharge instructions reviewed and discussed with the patient, all questions and concerns answered. Copy of instructions and care notes for new medication and diagnosis given to patient.  Allergies as of 11/26/2018      Reactions   Codeine Nausea And Vomiting      Medication List    TAKE these medications   ALPRAZolam 0.25 MG tablet Commonly known as:  XANAX Take 0.25 mg by mouth every 8 (eight) hours as needed. for anxiety   aspirin 81 MG EC tablet Take 1 tablet (81 mg total) by mouth daily.   atorvastatin 80 MG tablet Commonly known as:  LIPITOR Take 1 tablet (80 mg total) by mouth daily at 6 PM.   clopidogrel 75 MG tablet Commonly known as:  PLAVIX Take 1 tablet (75 mg total) by mouth daily.   ipratropium-albuterol 0.5-2.5 (3) MG/3ML Soln Commonly known as:  DUONEB Take 3 mLs by nebulization 2 (two) times daily.   ivabradine 7.5 MG Tabs tablet Commonly known as:  CORLANOR Take 1 tablet (7.5 mg total) by mouth 2 (two) times daily with a meal. What changed:  how much to take   nitroGLYCERIN 0.4 MG SL tablet Commonly known as:  NITROSTAT Place 1 tablet (0.4 mg total) under the tongue every 5 (five) minutes as needed for chest pain.   oxyCODONE 5 MG immediate release tablet Commonly known as:  Oxy IR/ROXICODONE Take 5 mg by mouth every 6 (six) hours as needed. for pain   spironolactone 25 MG tablet Commonly known as:  ALDACTONE Take 1 tablet (25 mg total) by mouth daily.   torsemide 20 MG tablet Commonly known as:  DEMADEX Take 3 tablets (60 mg total) by mouth daily. Take additional as directed by HF team. What changed:  additional instructions       IV site discontinued and catheter remains intact. Site without signs and symptoms of complications. Dressing and pressure applied.  Patient escorted to car by NT/volunteer services in a wheelchair,  no distress noted upon discharge.  Wynetta Emery, Claretta Kendra  C 11/26/2018 9:48 AM

## 2018-11-26 NOTE — Discharge Instructions (Signed)
Follow with Primary MD Scifres, [name], PA-C in 7 days   Get CBC, CMP, 2 view Chest X ray -  checked  by Primary MD  in 5-7 days   Activity: As tolerated with Full fall precautions use walker/cane & assistance as needed  Disposition Home   Diet: Heart Healthy with 1.2 lit/day fluid restriction  Check your Weight same time everyday, if you gain over 2 pounds, or you develop in leg swelling, experience more shortness of breath or chest pain, call your Primary MD immediately. Follow Cardiac Low Salt Diet and 1.2 lit/day fluid restriction.  Special Instructions: If you have smoked or chewed Tobacco  in the last 2 yrs please stop smoking, stop any regular Alcohol  and or any Recreational drug use.  On your next visit with your primary care physician please Get Medicines reviewed and adjusted.  Please request your Prim.MD to go over all Hospital Tests and Procedure/Radiological results at the follow up, please get all Hospital records sent to your Prim MD by signing hospital release before you go home.  If you experience worsening of your admission symptoms, develop shortness of breath, life threatening emergency, suicidal or homicidal thoughts you must seek medical attention immediately by calling 911 or calling your MD immediately  if symptoms less severe.  You Must read complete instructions/literature along with all the possible adverse reactions/side effects for all the Medicines you take and that have been prescribed to you. Take any new Medicines after you have completely understood and accpet all the possible adverse reactions/side effects.   Do not drive, operate heavy machinery, perform activities at heights, swimming or participation in water activities or provide baby sitting services if your were admitted for syncope or siezures until you have seen by Primary MD or a Neurologist and advised to do so again.

## 2018-11-26 NOTE — Discharge Summary (Signed)
Brittany [name] BWL:[id number] DOB: June 14, 1961 DOA: 11/24/2018  PCP: Maude Leriche, PA-C  Admit date: 11/24/2018  Discharge date: 11/26/2018  Admitted From: Home   Disposition:  Home   Recommendations for Outpatient Follow-up:   Follow up with PCP in 1-2 weeks  PCP Please obtain BMP/CBC, 2 view CXR in 1week,  (see Discharge instructions)   PCP Please follow up on the following pending results:    Home Health: None   Equipment/Devices: None  Consultations: Cards Discharge Condition: Stable   CODE STATUS: Full   Diet Recommendation: Heart Healthy with 1.2 L/day total fluid restriction   Chief Complaint  Patient presents with  . Shortness of Breath     Brief history of present illness from the day of admission and additional interim summary    Brittany [name] a 57 y.o.femalewith medical history significant ofCHF, EF 20% due to ICM, had emergent CABG in Feb this year. SCLC back in 2015 that seems to be in remission as far as anyone knows or can tell.  Patient has been suffering from recurrent R sided pleural effusions earlier this year. Had pleurex cath at one point and ultimately had mechanical pleurodesis 09/09/18 at [facility].  Throughout all of this it seems that the re-occurring effusion is due to CHF (repeated cytologies keep finding no malignant cells). She had been doing okay, and even saw Dr. [name] x9 days ago with fairly clear lungs on CXR at that time.  2 days ago she had some pizza and after that she has been acutely short of breath came to the ER where she was thought to have worsening of her right-sided transudative pleural effusion and she was admitted to the hospital.  Further work-up and history taking suggested that this might be more CHF than complications of her  pleural effusion.                                                                 Hospital Course      1.  Acute on chronic hypoxic respiratory failure likely due to due to on chronic systolic CHF with underlying EF 20% and mild to moderate reoccurrence of right-sided transudative pleural effusion.  Viewed patient's chart in detail also from [place], discussed her x-ray findings with cardiothoracic surgeon Dr.Owen.  Her pleural effusion does not seem to be the main issue, this seems to be dietary noncompliance related acute on chronic systolic CHF with EF around 20%.  She was initially placed on BiPAP, her main issue was fluid overload brought on by dietary noncompliance, she was aggressively diuresed with good effect, she is now down to 1 to 2 L nasal cannula oxygen and symptom-free, she will be continued on ivabradine along with diuretics once her renal function has stabilized, counseled on dietary compliance, CHF team following, in the  past she had low output issues and hypotension precluding the initiation of beta-blocker or ACE/ARB. [facility] CHF team who followed the patient here.    She is now back to baseline will be discharged home on her home medications, counseled on dietary compliance, she will follow with PCP and CHF team in 1 to 2 weeks and get her medications adjusted as needed at that time.  She will continue using 2 L nasal cannula oxygen at home.  2.  Recurrent right-sided loculated but transudative pleural effusion to be due to CHF.  S/p pleurodesis at [facility] a few months ago.  Not a surgical candidate at this time, supportive care as above.  3.  Small cell lung cancer.  In remission.  Multiple cytology from pleural effusion have been negative.  4.  COPD.  Stable.  Supportive care.  Quit smoking long time ago according to the patient.  5.  Paroxysmal atrial fibrillation.  Mali vas 2 score of at least 3.  Not on anticoagulation.  Continue dual antiplatelet agents along with rate  controlling agents if needed PRN.  6.  CAD status post CABG.  Continue dual antiplatelet therapy for secondary prevention.  7.  ARF.  resolved after 1 dose of Lasix was kept yesterday, avoid ACE/arm for now.  Renal function back to baseline now, PCP and CHF team to follow and adjust medications as needed in the future.   Discharge diagnosis     Principal Problem:   Acute on chronic systolic CHF (congestive heart failure) (HCC) Active Problems:   Atrial fibrillation (HCC)   Small cell lung cancer (HCC)   S/P CABG (coronary artery bypass graft)   Chronic respiratory failure with hypoxia (HCC)   Recurrent pleural effusion on right   Acute respiratory failure (HCC)   CHF (congestive heart failure) ([place])    Discharge instructions    Discharge Instructions    Discharge instructions   Complete by:  As directed    Follow with Primary MD Scifres, [name], PA-C in 7 days   Get CBC, CMP, 2 view Chest X ray -  checked  by Primary MD  in 5-7 days   Activity: As tolerated with Full fall precautions use walker/cane & assistance as needed  Disposition Home   Diet: Heart Healthy with 1.2 lit/day fluid restriction  Check your Weight same time everyday, if you gain over 2 pounds, or you develop in leg swelling, experience more shortness of breath or chest pain, call your Primary MD immediately. Follow Cardiac Low Salt Diet and 1.2 lit/day fluid restriction.  Special Instructions: If you have smoked or chewed Tobacco  in the last 2 yrs please stop smoking, stop any regular Alcohol  and or any Recreational drug use.  On your next visit with your primary care physician please Get Medicines reviewed and adjusted.  Please request your Prim.MD to go over all Hospital Tests and Procedure/Radiological results at the follow up, please get all Hospital records sent to your Prim MD by signing hospital release before you go home.  If you experience worsening of your admission symptoms, develop  shortness of breath, life threatening emergency, suicidal or homicidal thoughts you must seek medical attention immediately by calling 911 or calling your MD immediately  if symptoms less severe.  You Must read complete instructions/literature along with all the possible adverse reactions/side effects for all the Medicines you take and that have been prescribed to you. Take any new Medicines after you have completely understood and accpet all the possible  adverse reactions/side effects.   Do not drive, operate heavy machinery, perform activities at heights, swimming or participation in water activities or provide baby sitting services if your were admitted for syncope or siezures until you have seen by Primary MD or a Neurologist and advised to do so again.   Increase activity slowly   Complete by:  As directed       Discharge Medications   Allergies as of 11/26/2018      Reactions   Codeine Nausea And Vomiting      Medication List    TAKE these medications   ALPRAZolam 0.25 MG tablet Commonly known as:  XANAX Take 0.25 mg by mouth every 8 (eight) hours as needed. for anxiety   aspirin 81 MG EC tablet Take 1 tablet (81 mg total) by mouth daily.   atorvastatin 80 MG tablet Commonly known as:  LIPITOR Take 1 tablet (80 mg total) by mouth daily at 6 PM.   clopidogrel 75 MG tablet Commonly known as:  PLAVIX Take 1 tablet (75 mg total) by mouth daily.   ipratropium-albuterol 0.5-2.5 (3) MG/3ML Soln Commonly known as:  DUONEB Take 3 mLs by nebulization 2 (two) times daily.   ivabradine 7.5 MG Tabs tablet Commonly known as:  CORLANOR Take 1 tablet (7.5 mg total) by mouth 2 (two) times daily with a meal. What changed:  how much to take   nitroGLYCERIN 0.4 MG SL tablet Commonly known as:  NITROSTAT Place 1 tablet (0.4 mg total) under the tongue every 5 (five) minutes as needed for chest pain.   oxyCODONE 5 MG immediate release tablet Commonly known as:  Oxy IR/ROXICODONE Take  5 mg by mouth every 6 (six) hours as needed. for pain   spironolactone 25 MG tablet Commonly known as:  ALDACTONE Take 1 tablet (25 mg total) by mouth daily.   torsemide 20 MG tablet Commonly known as:  DEMADEX Take 60 mg by mouth daily.       Follow-up Information    Scifres, [name], [place]. Schedule an appointment as soon as possible for a visit in 1 week(s).   Specialty:  Physician Assistant Contact information: [place] [address] [phone]        Bensimhon, Shaune Pascal, MD. Schedule an appointment as soon as possible for a visit in 2 week(s).   Specialty:  Cardiology Contact information: [address] [place] [address] [phone]           Major procedures and Radiology Reports - PLEASE review detailed and final reports thoroughly  -     Ultrasound-guided thoracentesis ordered on 11/24/2018 - not enough fluid on [place]   Dg Chest 2 View  Result Date: 11/15/2018 CLINICAL DATA:  Recurrent RIGHT pleural effusion. EXAM: CHEST - 2 VIEW COMPARISON:  05/10/2018 FINDINGS: Cardiomegaly, CABG changes and LEFT ICD noted. A small RIGHT pleural effusion with RIGHT LOWER lung atelectasis have slightly increased. There is no evidence of pneumothorax or LEFT pleural effusion. No acute bony abnormalities are present. IMPRESSION: Slightly increasing small RIGHT pleural effusion and RIGHT LOWER lung atelectasis. Cardiomegaly Electronically Signed   By: Margarette Canada M.D.   On: 11/15/2018 16:39   [place] Chest (pleural Effusion)  Result Date: 11/24/2018 CLINICAL DATA:  Patient with complex past medical history significant for CHF requiring emergent CABG (02/03/2018 also was remote history of small cell right-sided lung cancer in March 2015 with recurrent symptomatic pleural effusions ultimately requiring right-sided chest tube and reported  right-sided presumed talc pleurodesis at outside institution, most recently treated at [facility]). Patient now presents for attempted ultrasound-guided thoracentesis. EXAM: CHEST ULTRASOUND COMPARISON:  Chest radiograph-11/24/2018; 11/15/2018; 05/10/2018; ultrasound-guided right-sided thoracentesis-05/05/2018 FINDINGS: Sonographic evaluation performed by the dictating interventional radiologist demonstrates small markedly loculated right-sided pleural effusion with interposed pulmonary parenchyma not amenable to ultrasound-guided thoracentesis. IMPRESSION: Small markedly loculated right-sided pleural effusion, not amenable to ultrasound-guided thoracentesis. Further evaluation with contrast-enhanced chest CT could as indicated. Electronically Signed   By: Sandi Mariscal M.D.   On: 11/24/2018 10:01   Dg Chest Portable 1 View  Result Date: 11/24/2018 CLINICAL DATA:  Shortness of breath. EXAM: PORTABLE CHEST 1 VIEW COMPARISON:  Radiograph 11/15/2018. Most recent CT 04/20/2018 FINDINGS: Progression of right pleural effusion since prior exam, now partially loculated. Left-sided pacemaker in place. Post median sternotomy. Cardiomegaly is unchanged. Unchanged mediastinal contours with post treatment related changes at the right hilum. Suspect mild interstitial edema. Small left pleural effusion. No pneumothorax. IMPRESSION: 1. Progression of right pleural effusion over the past 9 days, now partially loculated. 2. Suspect mild interstitial edema. Small left pleural effusion. Electronically Signed   By: Keith Rake M.D.   On: 11/24/2018 04:53    Micro Results     No results found for this or any previous visit (from the past 240 hour(s)).  Today   Subjective    Brittany [name] today has no headache,no chest abdominal pain,no new weakness tingling or numbness, feels much better wants to go home today.    Objective   Blood pressure 106/74, pulse 86, temperature 100.2 F (37.9 C), temperature source Oral, resp. rate 18, height 4\' 11"  (1.499 m), weight 46.1 kg, SpO2 98  %.   Intake/Output Summary (Last 24 hours) at 11/26/2018 0817 Last data filed at 11/25/2018 1742 Gross per 24 hour  Intake 360 ml  Output 600 ml  Net -240 ml    Exam  Awake Alert, Oriented x 3, No new F.N deficits, Normal affect [place].AT,PERRAL Supple Neck,No JVD, No cervical lymphadenopathy appriciated.  Symmetrical Chest wall movement, Good air movement bilaterally, CTAB RRR,No Gallops,Rubs or new Murmurs, No Parasternal Heave +ve B.Sounds, Abd Soft, Non tender, No organomegaly appriciated, No rebound -guarding or rigidity. No Cyanosis, Clubbing or edema, No new Rash or bruise   Data Review   CBC w Diff:  Lab Results  Component Value Date   WBC 8.2 11/24/2018   HGB 11.6 (L) 11/24/2018   HGB 9.1 (L) 03/14/2018   HGB 17.6 (H) 12/23/2014   HCT 42.0 11/24/2018   HCT 28.7 (L) 03/14/2018   HCT 53.9 (H) 12/23/2014   PLT 237 11/24/2018   PLT 402 (H) 03/14/2018   LYMPHOPCT 14 05/09/2018   LYMPHOPCT 11.2 (L) 12/23/2014   MONOPCT 10 05/09/2018   MONOPCT 6.8 12/23/2014   EOSPCT 13 05/09/2018   EOSPCT 2.4 12/23/2014   BASOPCT 1 05/09/2018   BASOPCT 0.3 12/23/2014    CMP:  Lab Results  Component Value Date   NA 139 11/26/2018   NA 138 03/14/2018   NA 142 12/23/2014   K 3.8 11/26/2018   K 3.9 12/23/2014   CL 101 11/26/2018   CO2 27 11/26/2018   CO2 26 12/23/2014   BUN 24 (H) 11/26/2018   BUN 13 03/14/2018   BUN 7.2 12/23/2014   CREATININE 1.02 (H) 11/26/2018   CREATININE 0.7 12/23/2014   PROT 7.2 11/15/2018   PROT 7.1 12/23/2014   ALBUMIN 4.1 11/15/2018   ALBUMIN 4.2 12/23/2014  BILITOT 0.9 11/15/2018   BILITOT 0.69 12/23/2014   ALKPHOS 76 11/15/2018   ALKPHOS 98 12/23/2014   AST 27 11/15/2018   AST 20 12/23/2014   ALT 17 11/15/2018   ALT 20 12/23/2014  .   Total Time in preparing paper work, data evaluation and todays exam - 61 minutes  Lala Lund M.D on 11/26/2018 at 8:17 AM  [facility]   Office  [phone]

## 2018-11-29 ENCOUNTER — Other Ambulatory Visit: Payer: Self-pay | Admitting: Internal Medicine

## 2018-11-29 ENCOUNTER — Telehealth (HOSPITAL_COMMUNITY): Payer: Self-pay

## 2018-11-29 NOTE — Telephone Encounter (Signed)
Pt called to schedule next CHP visit, we agree to meet after the holidays. She stated that she's really tired today. She has a appt set for next Thursday to see her pcp. I will f/u with after that visit

## 2018-12-05 ENCOUNTER — Other Ambulatory Visit (HOSPITAL_COMMUNITY): Payer: Self-pay | Admitting: *Deleted

## 2018-12-05 MED ORDER — IVABRADINE HCL 7.5 MG PO TABS: 11.2500 mg | ORAL_TABLET | Freq: Two times a day (BID) | ORAL | 3 refills | Status: DC

## 2018-12-12 ENCOUNTER — Telehealth (HOSPITAL_COMMUNITY): Payer: Self-pay

## 2018-12-12 NOTE — Telephone Encounter (Signed)
Pt called to schedule CHP visit/no answer and no message left. Pt's phone doesn't have vm setup.  I will f/u with pt

## 2018-12-16 ENCOUNTER — Inpatient Hospital Stay (HOSPITAL_COMMUNITY)
Admission: RE | Admit: 2018-12-16 | Payer: BLUE CROSS/BLUE SHIELD | Source: Ambulatory Visit | Admitting: Internal Medicine

## 2018-12-17 ENCOUNTER — Encounter: Payer: Self-pay | Admitting: Internal Medicine

## 2018-12-17 ENCOUNTER — Telehealth (HOSPITAL_COMMUNITY): Payer: Self-pay

## 2018-12-17 NOTE — Telephone Encounter (Signed)
Pt called to remind her the appt with heart failure clinic tomorrow. She stated that she thought that the appt was Thursday and that she will not be able to make tomorrow's appt.  The clinic is closed at this time, I will f/u with scheduling tomorrow.    Pt is available for morning appts/ preferably Friday if she need to come this week.

## 2018-12-18 ENCOUNTER — Encounter: Payer: BLUE CROSS/BLUE SHIELD | Admitting: Internal Medicine

## 2018-12-18 ENCOUNTER — Inpatient Hospital Stay (HOSPITAL_COMMUNITY): Payer: BLUE CROSS/BLUE SHIELD

## 2018-12-19 ENCOUNTER — Encounter: Payer: Self-pay | Admitting: Internal Medicine

## 2018-12-30 ENCOUNTER — Inpatient Hospital Stay (HOSPITAL_COMMUNITY): Payer: BLUE CROSS/BLUE SHIELD

## 2019-01-03 ENCOUNTER — Ambulatory Visit (INDEPENDENT_AMBULATORY_CARE_PROVIDER_SITE_OTHER): Payer: BLUE CROSS/BLUE SHIELD | Admitting: Internal Medicine

## 2019-01-03 ENCOUNTER — Telehealth: Payer: Self-pay

## 2019-01-03 ENCOUNTER — Encounter: Payer: Self-pay | Admitting: Internal Medicine

## 2019-01-03 VITALS — BP 98/66 | HR 81 | Ht 59.0 in | Wt 101.6 lb

## 2019-01-03 DIAGNOSIS — I5022 Chronic systolic (congestive) heart failure: Secondary | ICD-10-CM | POA: Diagnosis not present

## 2019-01-03 DIAGNOSIS — I480 Paroxysmal atrial fibrillation: Secondary | ICD-10-CM | POA: Diagnosis not present

## 2019-01-03 NOTE — Progress Notes (Signed)
Scifres, Dorothy, PA-C: Primary Cardiologist:  Dr [name]  Brittany [name] is a 58 y.o. female with a h/o ischemic CM sp ICD (MDT) by Dr Loel Lofty in [place] for primary prevention who presents today to establish care in the Electrophysiology device clinic.  She has a h/o small cell lung Ca and is s/p treatment with prior chemo, XRT, and radiation (brain).  She has a h/o CAD and is s/p CABG.  She underwent ICD implant (MDT) 2019.   She had a prolonged hospitalization in December but is making significant progress.  She carries a history of atrial fibrillation.  QRS is < 130 msec.   Today, she  denies symptoms of palpitations, chest pain, shortness of breath, orthopnea, PND, lower extremity edema, dizziness, presyncope, syncope, or neurologic sequela.  The patientis tolerating medications without difficulties and is otherwise without complaint today.   Past Medical History:  Diagnosis Date  . Acute systolic congestive heart failure ([place]) 02/03/2018  . Allergy   . Anxiety   . Asthma   . Hypertension   . PAF (paroxysmal atrial fibrillation) ([place])   . Prophylactic measure 08/03/14-08/19/14   Prophyl. cranial radiation 24 Gy  . 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 Small cell lung cancer ([place]) 03/16/2014   Past Surgical History:  Procedure Laterality Date  . CARDIAC DEFIBRILLATOR PLACEMENT  08/15/2018   MDT Visia AF MRI VR ICD implanted by Dr Loel Lofty for primary prevention of sudden  . CESAREAN SECTION    . CORONARY ARTERY BYPASS GRAFT N/A 02/03/2018   Procedure: CORONARY ARTERY BYPASS GRAFTING (CABG);  Surgeon: Rexene Alberts, MD;  Location: [place];  Service: Open Heart Surgery;  Laterality: N/A;  Time 3 using left internal mammary artery and endoscopically harvested right saphenous vein  . CORONARY BALLOON ANGIOPLASTY N/A 02/03/2018   Procedure: CORONARY BALLOON  ANGIOPLASTY;  Surgeon: Martinique, Peter M, MD;  Location: [place] [facility];  Service: Cardiovascular;  Laterality: N/A;  . CORONARY/GRAFT ACUTE MI REVASCULARIZATION N/A 02/03/2018   Procedure: Coronary/Graft Acute MI Revascularization;  Surgeon: Martinique, Peter M, MD;  Location: [place] [facility];  Service: Cardiovascular;  Laterality: N/A;  . IABP INSERTION N/A 02/03/2018   Procedure: IABP Insertion;  Surgeon: Martinique, Peter M, MD;  Location: [place] [facility];  Service: Cardiovascular;  Laterality: N/A;  . INTRAOPERATIVE TRANSESOPHAGEAL ECHOCARDIOGRAM N/A 02/03/2018   Procedure: INTRAOPERATIVE TRANSESOPHAGEAL ECHOCARDIOGRAM;  Surgeon: Rexene Alberts, MD;  Location: [place];  Service: Open Heart Surgery;  Laterality: N/A;  . LEFT HEART CATH AND CORONARY ANGIOGRAPHY N/A 02/03/2018   Procedure: LEFT HEART CATH AND CORONARY ANGIOGRAPHY;  Surgeon: Martinique, Peter M, MD;  Location: [place] [facility];  Service: Cardiovascular;  Laterality: N/A;  . MEDIASTINOSCOPY N/A 03/11/2014   Procedure: MEDIASTINOSCOPY;  Surgeon: Melrose Nakayama, MD;  Location: [place];  Service: Thoracic;  Laterality: N/A;  . PLACEMENT OF [place] LEFT VENTRICULAR ASSIST DEVICE  02/03/2018   Procedure: PLACEMENT OF [place] LEFT VENTRICULAR ASSIST DEVICE LD;  Surgeon: Rexene Alberts, MD;  Location: [place];  Service: Open Heart Surgery;;  . REMOVAL OF IMPELLA LEFT VENTRICULAR ASSIST DEVICE N/A 02/08/2018   Procedure: REMOVAL OF [place] LEFT VENTRICULAR ASSIST DEVICE;  Surgeon: Rexene Alberts, MD;  Location: [place];  Service: Open Heart Surgery;  Laterality: N/A;  . RIGHT HEART CATH N/A 02/03/2018  Procedure: RIGHT HEART CATH;  Surgeon: Martinique, Peter M, MD;  Location: [place] [facility];  Service: Cardiovascular;  Laterality: N/A;  . RIGHT HEART CATH N/A 05/09/2018   Procedure: RIGHT HEART CATH;  Surgeon: Jolaine Artist, MD;  Location: [place] [facility];  Service: Cardiovascular;  Laterality: N/A;  . TEE WITHOUT CARDIOVERSION N/A  02/08/2018   Procedure: TRANSESOPHAGEAL ECHOCARDIOGRAM (TEE);  Surgeon: Rexene Alberts, MD;  Location: [place];  Service: Open Heart Surgery;  Laterality: N/A;  . TUBAL LIGATION    . VIDEO BRONCHOSCOPY WITH ENDOBRONCHIAL ULTRASOUND N/A 03/11/2014   Procedure: VIDEO BRONCHOSCOPY WITH ENDOBRONCHIAL ULTRASOUND;  Surgeon: Melrose Nakayama, MD;  Location: [place];  Service: Thoracic;  Laterality: N/A;    Social History   Socioeconomic History  . Marital status: Married    Spouse name: Not on file  . Number of children: Not on file  . Years of education: Not on file  . Highest education level: Not on file  Occupational History  . Not on file  Social Needs  . Financial resource strain: Not on file  . Food insecurity:    Worry: Not on file    Inability: Not on file  . Transportation needs:    Medical: Not on file    Non-medical: Not on file  Tobacco Use  . Smoking status: Former Smoker    Packs/day: 1.00    Years: 20.00    Pack years: 20.00    Types: Cigarettes    Last attempt to quit: 03/13/2014    Years since quitting: 4.8  . Smokeless tobacco: Never Used  Substance and Sexual Activity  . Alcohol use: Yes    Alcohol/week: 8.0 standard drinks    Types: 8 Glasses of wine per week  . Drug use: No  . Sexual activity: Never  Lifestyle  . Physical activity:    Days per week: Not on file    Minutes per session: Not on file  . Stress: Not on file  Relationships  . Social connections:    Talks on phone: Not on file    Gets together: Not on file    Attends religious service: Not on file    Active member of club or organization: Not on file    Attends meetings of clubs or organizations: Not on file    Relationship status: Not on file  . Intimate partner violence:    Fear of current or ex partner: Not on file    Emotionally abused: Not on file    Physically abused: Not on file    Forced sexual activity: Not on file  Other Topics Concern  . Not on file  Social History Narrative    . Not on file    Family History  Problem Relation Age of Onset  . Heart disease Mother   . Hypertension Mother   . Heart attack Mother   . Hypertension Maternal Grandmother   . Cancer Maternal Grandmother   . Diabetes Paternal Grandmother   . Stroke Neg Hx     Allergies  Allergen Reactions  . Codeine Nausea And Vomiting    Current Outpatient Medications  Medication Sig Dispense Refill  . ALPRAZolam (XANAX) 0.25 MG tablet Take 0.25 mg by mouth every 8 (eight) hours as needed. for anxiety  0  . aspirin 81 MG EC tablet Take 1 tablet (81 mg total) by mouth daily. 30 tablet 1  . atorvastatin (LIPITOR) 80 MG tablet Take 1 tablet (80 mg total) by  mouth daily at 6 PM. 30 tablet 1  . clopidogrel (PLAVIX) 75 MG tablet Take 1 tablet (75 mg total) by mouth daily. 30 tablet 1  . ipratropium-albuterol (DUONEB) 0.5-2.5 (3) MG/3ML SOLN Take 3 mLs by nebulization 2 (two) times daily. 360 mL 0  . ivabradine (CORLANOR) 7.5 MG TABS tablet Take 1.5 tablets (11.25 mg total) by mouth 2 (two) times daily with a meal. 45 tablet 3  . nitroGLYCERIN (NITROSTAT) 0.4 MG SL tablet Place 1 tablet (0.4 mg total) under the tongue every 5 (five) minutes as needed for chest pain. 20 tablet 0  . oxyCODONE (OXY IR/ROXICODONE) 5 MG immediate release tablet Take 5 mg by mouth every 6 (six) hours as needed. for pain  0  . spironolactone (ALDACTONE) 25 MG tablet Take 1 tablet (25 mg total) by mouth daily. 30 tablet 1  . torsemide (DEMADEX) 20 MG tablet Take 3 tablets (60 mg total) by mouth daily. Take additional as directed by HF team. 110 tablet 6   No current facility-administered medications for this visit.     ROS- all systems are reviewed and negative except as per HPI  Physical Exam: Vitals:   01/03/19 1038  BP: 98/66  Pulse: 81  SpO2: 99%  Weight: 101 lb 9.6 oz (46.1 kg)  Height: 4\' 11"  (1.499 m)    GEN- The patient is well appearing, alert and oriented x 3 today.   Head- normocephalic,  atraumatic Eyes-  Sclera clear, conjunctiva pink Ears- hearing intact Oropharynx- clear Neck- supple  Lungs- Clear to ausculation bilaterally, normal work of breathing Chest- ICD pocket is well healed Heart- Regular rate and rhythm, no murmurs, rubs or gallops, PMI not laterally displaced GI- soft, NT, ND, + BS Extremities- no clubbing, cyanosis, or edema MS- no significant deformity or atrophy Skin- no rash or lesion Psych- euthymic mood, full affect Neuro- strength and sensation are intact  ICD interrogation- reviewed in detail today,  See PACEART report  Assessment and Plan:  1. Ischemic CM/ CAD No ischemic symptoms euvolemic today She is s/p MDT ICD for primary prevention Tachy therapies are programmed according to office protocol today Normal device function See paceart QRS <130 msec, no indication for upgrade at this time Enroll in remote monitoring and ICM follow-up with Sharman Cheek  2. Paroxysmal atrial fibrillation By history Not currently on anticoagulation Will follow with Visia AF device and consider anticoagulation if her AF burden increases  carelink Follow-up with CHF clinic as scheduled Return in 1 year to see EP NP

## 2019-01-03 NOTE — Telephone Encounter (Signed)
LVM with [facility] at [phone] to release pt in [facility].

## 2019-01-03 NOTE — Patient Instructions (Signed)
Medication Instructions:  No change If you need a refill on your cardiac medications before your next appointment, please call your pharmacy.   Lab work: none If you have labs (blood work) drawn today and your tests are completely normal, you will receive your results only by: Marland Kitchen MyChart Message (if you have MyChart) OR . A paper copy in the mail If you have any lab test that is abnormal or we need to change your treatment, we will call you to review the results.  Testing/Procedures: none  Follow-Up: At [facility], you and your health needs are our priority.  As part of our continuing mission to provide you with exceptional heart care, we have created designated Provider Care Teams.  These Care Teams include your primary Cardiologist (physician) and Advanced Practice Providers (APPs -  Physician Assistants and Nurse Practitioners) who all work together to provide you with the care you need, when you need it. You will need a follow up appointment in 12 months with Brittany Marshall, NP.  Please call our office 2 months in advance to schedule this appointment.    Any Other Special Instructions Will Be Listed Below (If Applicable).

## 2019-01-06 LAB — CUP PACEART INCLINIC DEVICE CHECK
Date Time Interrogation Session: 20200127094225
Implantable Lead Implant Date: 20190905
Implantable Pulse Generator Implant Date: 20190905
MDC IDC LEAD LOCATION: 753860

## 2019-01-08 NOTE — Telephone Encounter (Signed)
LMOVM for pt to be released in carelink. Left my direct number for clinic to call back w/ questions.

## 2019-01-10 ENCOUNTER — Telehealth (HOSPITAL_COMMUNITY): Payer: Self-pay

## 2019-01-10 NOTE — Telephone Encounter (Signed)
I called pt to schedule CHP visit. She agrees to meet Tuesday 2/4 @ 1230.  She stated that she feels a lot better and doesn't have any complaints.

## 2019-01-10 NOTE — Telephone Encounter (Signed)
Spoke w/ [facility]. Informed them that we still could not transfer the patient into our clinic.

## 2019-01-13 NOTE — Telephone Encounter (Signed)
Patient is enrolled in our carelink.

## 2019-01-14 ENCOUNTER — Other Ambulatory Visit (HOSPITAL_COMMUNITY): Payer: Self-pay

## 2019-01-14 NOTE — Progress Notes (Signed)
Paramedicine Encounter    Patient ID: Brittany [name], female    DOB: 06/19/1961, 58 y.o.   MRN: [id number]    Patient Care Team: Scifres, Durel Salts as PCP - General (Physician Assistant) [name], Shaune Pascal, MD as PCP - Cardiology (Cardiology) Jorge Ny, LCSW as Social Worker (Licensed Clinical Social Worker)  Patient Active Problem List   Diagnosis Date Noted  . CHF (congestive heart failure) ([place]) 11/24/2018  . Recurrent pleural effusion on right 05/05/2018  . Acute respiratory failure ([place]) 05/05/2018  . Dyspnea 04/19/2018  . Chronic respiratory failure with hypoxia ([place])   . Acute on chronic systolic CHF (congestive heart failure) ([place]) 02/03/2018  . S/P CABG (coronary artery bypass graft) 02/03/2018  . Asthma   . Anxiety   . Allergy   . HCAP (healthcare-associated pneumonia) 03/15/2015  . Chest pain 03/15/2015  . Small cell lung cancer ([place]) 03/16/2014  . Protein-calorie malnutrition, severe ([place]) 03/14/2014  . Atrial fibrillation ([place]) 03/12/2014  . HTN (hypertension) 05/07/2012    Current Outpatient Medications:  .  ALPRAZolam (XANAX) 0.25 MG tablet, Take 0.25 mg by mouth every 8 (eight) hours as needed. for anxiety, Disp: , Rfl: 0 .  aspirin 81 MG EC tablet, Take 1 tablet (81 mg total) by mouth daily., Disp: 30 tablet, Rfl: 1 .  atorvastatin (LIPITOR) 80 MG tablet, Take 1 tablet (80 mg total) by mouth daily at 6 PM., Disp: 30 tablet, Rfl: 1 .  ivabradine (CORLANOR) 7.5 MG TABS tablet, Take 1.5 tablets (11.25 mg total) by mouth 2 (two) times daily with a meal., Disp: 45 tablet, Rfl: 3 .  oxyCODONE (OXY IR/ROXICODONE) 5 MG immediate release tablet, Take 5 mg by mouth every 6 (six) hours as needed. for pain, Disp: , Rfl: 0 .  spironolactone (ALDACTONE) 25 MG tablet, Take 1 tablet (25 mg total) by mouth daily., Disp: 30 tablet, Rfl: 1 .  torsemide (DEMADEX) 20 MG tablet, Take 3 tablets (60 mg total) by mouth daily. Take additional as directed by HF team.,  Disp: 110 tablet, Rfl: 6 .  clopidogrel (PLAVIX) 75 MG tablet, Take 1 tablet (75 mg total) by mouth daily., Disp: 30 tablet, Rfl: 1 .  ipratropium-albuterol (DUONEB) 0.5-2.5 (3) MG/3ML SOLN, Take 3 mLs by nebulization 2 (two) times daily., Disp: 360 mL, Rfl: 0 .  nitroGLYCERIN (NITROSTAT) 0.4 MG SL tablet, Place 1 tablet (0.4 mg total) under the tongue every 5 (five) minutes as needed for chest pain., Disp: 20 tablet, Rfl: 0 Allergies  Allergen Reactions  . Codeine Nausea And Vomiting     Social History   Socioeconomic History  . Marital status: Married    Spouse name: Not on file  . Number of children: Not on file  . Years of education: Not on file  . Highest education level: Not on file  Occupational History  . Not on file  Social Needs  . Financial resource strain: Not on file  . Food insecurity:    Worry: Not on file    Inability: Not on file  . Transportation needs:    Medical: Not on file    Non-medical: Not on file  Tobacco Use  . Smoking status: Former Smoker    Packs/day: 1.00    Years: 20.00    Pack years: 20.00    Types: Cigarettes    Last attempt to quit: 03/13/2014    Years since quitting: 4.8  . Smokeless tobacco: Never Used  Substance and Sexual Activity  . Alcohol use: Yes  Alcohol/week: 8.0 standard drinks    Types: 8 Glasses of wine per week  . Drug use: No  . Sexual activity: Never  Lifestyle  . Physical activity:    Days per week: Not on file    Minutes per session: Not on file  . Stress: Not on file  Relationships  . Social connections:    Talks on phone: Not on file    Gets together: Not on file    Attends religious service: Not on file    Active member of club or organization: Not on file    Attends meetings of clubs or organizations: Not on file    Relationship status: Not on file  . Intimate partner violence:    Fear of current or ex partner: Not on file    Emotionally abused: Not on file    Physically abused: Not on file    Forced  sexual activity: Not on file  Other Topics Concern  . Not on file  Social History Narrative  . Not on file    Physical Exam Pulmonary:     Effort: Pulmonary effort is normal. No respiratory distress.     Breath sounds: No wheezing or rales.  Abdominal:     General: There is no distension.     Tenderness: There is no guarding.  Musculoskeletal:        General: No swelling.     Right lower leg: No edema.     Left lower leg: No edema.  Skin:    General: Skin is warm and dry.         Future Appointments  Date Time Provider [place]  04/04/2019  7:00 AM [facility] DEVICE REMOTES [facility]  04/07/2019  9:30 AM Rexene Alberts, MD [facility]     BP 100/70 (BP Location: Right Arm, Patient Position: Sitting, Cuff Size: Normal)   Pulse 72   Resp 16   Wt 99 lb (44.9 kg)   SpO2 98%   BMI 20.00 kg/m   Weight yesterday-99 Last visit weight-no weight recorded (she didn't have a scale)  ATF pt CAO x4. She states that she feels a lot better since the operation on her lungs to decrease the fluid retention.  She states that she's able to sleep throughout the night; she doesn't need oxygen anymore. She keep one tank at the house for emergencies.  Pt also states that she has more energy than better.  She would like to gain weight since she's healthier now. We discussed healthy food options that would help her reach her weight goals.    Pt stated that the SSI has come thru for her which includes medicaid. She also has a pcp (Dr. Judeth [facility]) [address] Pt stated that her pcp requested that she goes to [place].  She stated she doesn't have transportation, so I gave her the number for medicaid transportation. She no longer need financial assistance for her medications.   rx bottles verified   Medication ordered: none  Jaremy Nosal, EMT Paramedic [phone] 01/14/2019    ACTION: Home visit completed

## 2019-01-17 ENCOUNTER — Telehealth: Payer: Self-pay

## 2019-01-17 NOTE — Telephone Encounter (Signed)
Referred to ICM clinic by Dr Rayann Heman. Attempted call to patient for ICM intro and voice mail box has not been set up.

## 2019-01-31 NOTE — Telephone Encounter (Signed)
Spoke with patient and agreeable to monthly follow up.  Advised monitor should be by bedside in order for it to automatically transmit a report during sleep hours of 12 midnight and 6 AM.  Patient confirmed monitor is at bedside. Advised will receive a call after the transmission is reviewed to provide results.  Explained a Remote Home Transmission will be seen as daytime appointment on office summary visit sheet but the time is not relevant since all transmissions are sent at night time so there is no obligation to stay by the monitor at that time appointed time during the day. Provided ICM number and explained should call if experiencing any fluid symptoms such as weight gain, shortness of breath or extremity/abdominal swelling. She reported she has been feeling very tired and lack of energy lately.  She should have follow up in January with Dr [name] but appt was canceled due to she did not have transportation.  Advised to call that office to make an appt and she said she would do so.  She does not think she has any fluid at this time.  Baseline weight is 101 lbs and she has been instructed to try and gain a few pounds. She said it is hard to eat healthy and gain weight.  She tries to increase carbs but she often times does not feel like cooking and does not have an appetite. She is working with [name] of the [employer] and visit is planned next week at home. Other than fatigue she has no further complaints today.  Remote transmission scheduled 02/03/2019.

## 2019-02-03 ENCOUNTER — Ambulatory Visit (INDEPENDENT_AMBULATORY_CARE_PROVIDER_SITE_OTHER): Payer: BLUE CROSS/BLUE SHIELD

## 2019-02-03 DIAGNOSIS — I5022 Chronic systolic (congestive) heart failure: Secondary | ICD-10-CM | POA: Diagnosis not present

## 2019-02-03 DIAGNOSIS — Z95810 Presence of automatic (implantable) cardiac defibrillator: Secondary | ICD-10-CM

## 2019-02-03 NOTE — Progress Notes (Signed)
EPIC Encounter for ICM Monitoring  Patient Name: Brittany [name] is a 58 y.o. female Date: 02/03/2019 Primary Care Physican: Scifres, [name], PA-C Primary Cardiologist: Smoot Electrophysiologist: Allred Last Weight: 101 lbs Today's Weight: 101 lbs       1st ICM Remote.  Heart Failure questions reviewed.  Pt having some fatigue but not worse than usual.    Report: Thoracic impedance normal today but was abnormal suggesting fluid accumulation from 01/28/2019 - 02/02/2019.   Prescribed: Torsemide 20 mg Take 3 tablets (60 mg total) by mouth daily. Take additional as directed by HF team.  Labs: 11/26/2018 Creatinine 1.02, BUN 24, Potassium 3.8, Sodium 139, GFR >60  11/25/2018 Creatinine 1.31, BUN 26, Potassium 3.3, Sodium 139, GFR 45-52  11/24/2018 Creatinine 0.92, BUN 12, Potassium 3.6, Sodium 141, GFR >60  11/15/2018 Creatinine 1.00, BUN 17, Potassium 3.7, Sodium 140, GFR >60  A complete set of results can be found in Results Review.  Recommendations:  No changes.  Discussed limiting salt intake to < 2000 mg daily.  Encouraged to call for fluid symptoms.  Follow-up plan: ICM clinic phone appointment on 03/10/2019.  Advised to call HF clinic to schedule an appt with Dr [name]    Copy of ICM check sent to Dr. Rayann Heman.   3 month ICM trend: 02/03/2019    1 Year ICM trend:       [name], RN 02/03/2019 12:39 PM

## 2019-02-07 ENCOUNTER — Telehealth (HOSPITAL_COMMUNITY): Payer: Self-pay

## 2019-02-07 NOTE — Telephone Encounter (Signed)
I called and spoke with Brittany [name], she stated that she has soreness in her chest, where she had surgery.  She stated that it doesn't feel like her heart, just the incision.  Pt was making the bed when the pain began.  She stated that she would take over the counter pain reliever and take it easy.  CHP visit scheduled for next Thursday .

## 2019-02-10 ENCOUNTER — Other Ambulatory Visit (HOSPITAL_COMMUNITY): Payer: Self-pay

## 2019-02-10 DIAGNOSIS — I5022 Chronic systolic (congestive) heart failure: Secondary | ICD-10-CM

## 2019-02-10 MED ORDER — SPIRONOLACTONE 25 MG PO TABS: 25.0000 mg | ORAL_TABLET | Freq: Every day | ORAL | 1 refills | Status: DC

## 2019-02-12 ENCOUNTER — Other Ambulatory Visit: Payer: Self-pay

## 2019-02-12 ENCOUNTER — Inpatient Hospital Stay (HOSPITAL_COMMUNITY)
Admission: EM | Disposition: A | Discharge status: Home / Self Care | Payer: Self-pay | Source: Home / Self Care | Attending: Internal Medicine

## 2019-02-12 ENCOUNTER — Inpatient Hospital Stay (HOSPITAL_COMMUNITY)
Admission: EM | Admit: 2019-02-12 | Discharge: 2019-02-14 | DRG: 246 | Disposition: A | Discharge status: Home / Self Care | Payer: BLUE CROSS/BLUE SHIELD | Attending: Internal Medicine | Admitting: Internal Medicine

## 2019-02-12 ENCOUNTER — Encounter (HOSPITAL_COMMUNITY): Payer: Self-pay | Admitting: Emergency Medicine

## 2019-02-12 ENCOUNTER — Emergency Department (HOSPITAL_COMMUNITY): Payer: BLUE CROSS/BLUE SHIELD

## 2019-02-12 ENCOUNTER — Inpatient Hospital Stay (HOSPITAL_COMMUNITY): Payer: BLUE CROSS/BLUE SHIELD

## 2019-02-12 DIAGNOSIS — I959 Hypotension, unspecified: Secondary | ICD-10-CM | POA: Diagnosis present

## 2019-02-12 DIAGNOSIS — F419 Anxiety disorder, unspecified: Secondary | ICD-10-CM | POA: Diagnosis present

## 2019-02-12 DIAGNOSIS — R001 Bradycardia, unspecified: Secondary | ICD-10-CM | POA: Diagnosis present

## 2019-02-12 DIAGNOSIS — I5023 Acute on chronic systolic (congestive) heart failure: Secondary | ICD-10-CM

## 2019-02-12 DIAGNOSIS — I110 Hypertensive heart disease with heart failure: Secondary | ICD-10-CM | POA: Diagnosis present

## 2019-02-12 DIAGNOSIS — Z9861 Coronary angioplasty status: Secondary | ICD-10-CM | POA: Diagnosis not present

## 2019-02-12 DIAGNOSIS — Z79899 Other long term (current) drug therapy: Secondary | ICD-10-CM

## 2019-02-12 DIAGNOSIS — I2510 Atherosclerotic heart disease of native coronary artery without angina pectoris: Secondary | ICD-10-CM | POA: Diagnosis present

## 2019-02-12 DIAGNOSIS — E876 Hypokalemia: Secondary | ICD-10-CM | POA: Diagnosis present

## 2019-02-12 DIAGNOSIS — Z85118 Personal history of other malignant neoplasm of bronchus and lung: Secondary | ICD-10-CM

## 2019-02-12 DIAGNOSIS — I252 Old myocardial infarction: Secondary | ICD-10-CM

## 2019-02-12 DIAGNOSIS — Z833 Family history of diabetes mellitus: Secondary | ICD-10-CM

## 2019-02-12 DIAGNOSIS — I340 Nonrheumatic mitral (valve) insufficiency: Secondary | ICD-10-CM | POA: Diagnosis not present

## 2019-02-12 DIAGNOSIS — I480 Paroxysmal atrial fibrillation: Secondary | ICD-10-CM | POA: Diagnosis present

## 2019-02-12 DIAGNOSIS — Z8249 Family history of ischemic heart disease and other diseases of the circulatory system: Secondary | ICD-10-CM | POA: Diagnosis not present

## 2019-02-12 DIAGNOSIS — Z7982 Long term (current) use of aspirin: Secondary | ICD-10-CM

## 2019-02-12 DIAGNOSIS — Z951 Presence of aortocoronary bypass graft: Secondary | ICD-10-CM | POA: Diagnosis not present

## 2019-02-12 DIAGNOSIS — J9611 Chronic respiratory failure with hypoxia: Secondary | ICD-10-CM | POA: Diagnosis present

## 2019-02-12 DIAGNOSIS — I429 Cardiomyopathy, unspecified: Secondary | ICD-10-CM | POA: Diagnosis present

## 2019-02-12 DIAGNOSIS — I214 Non-ST elevation (NSTEMI) myocardial infarction: Principal | ICD-10-CM | POA: Diagnosis present

## 2019-02-12 DIAGNOSIS — Z87891 Personal history of nicotine dependence: Secondary | ICD-10-CM | POA: Diagnosis not present

## 2019-02-12 DIAGNOSIS — J90 Pleural effusion, not elsewhere classified: Secondary | ICD-10-CM | POA: Diagnosis present

## 2019-02-12 DIAGNOSIS — Z95810 Presence of automatic (implantable) cardiac defibrillator: Secondary | ICD-10-CM | POA: Diagnosis not present

## 2019-02-12 DIAGNOSIS — Z955 Presence of coronary angioplasty implant and graft: Secondary | ICD-10-CM

## 2019-02-12 DIAGNOSIS — Z9221 Personal history of antineoplastic chemotherapy: Secondary | ICD-10-CM

## 2019-02-12 DIAGNOSIS — Z7902 Long term (current) use of antithrombotics/antiplatelets: Secondary | ICD-10-CM | POA: Diagnosis not present

## 2019-02-12 DIAGNOSIS — Z885 Allergy status to narcotic agent status: Secondary | ICD-10-CM

## 2019-02-12 HISTORY — PX: LEFT HEART CATH AND CORS/GRAFTS ANGIOGRAPHY: CATH118250

## 2019-02-12 HISTORY — PX: CORONARY STENT INTERVENTION: CATH118234

## 2019-02-12 LAB — CBC WITH DIFFERENTIAL/PLATELET
Band Neutrophils: 0 %
Basophils Absolute: 0.1 10*3/uL (ref 0.0–0.1)
Basophils Relative: 1 %
Blasts: 0 %
Eosinophils Absolute: 0.2 10*3/uL (ref 0.0–0.5)
Eosinophils Relative: 2 %
HCT: 45.9 % (ref 36.0–46.0)
Hemoglobin: 14.2 g/dL (ref 12.0–15.0)
Lymphocytes Relative: 10 %
Lymphs Abs: 1.1 10*3/uL (ref 0.7–4.0)
MCH: 28 pg (ref 26.0–34.0)
MCHC: 30.9 g/dL (ref 30.0–36.0)
MCV: 90.4 fL (ref 80.0–100.0)
Metamyelocytes Relative: 0 %
Monocytes Absolute: 0.6 10*3/uL (ref 0.1–1.0)
Monocytes Relative: 5 %
Myelocytes: 0 %
Neutro Abs: 9.1 10*3/uL — ABNORMAL HIGH (ref 1.7–7.7)
Neutrophils Relative %: 82 %
Other: 0 %
Platelets: 202 10*3/uL (ref 150–400)
Promyelocytes Relative: 0 %
RBC: 5.08 MIL/uL (ref 3.87–5.11)
RDW: 21.6 % — ABNORMAL HIGH (ref 11.5–15.5)
WBC: 11.1 10*3/uL — ABNORMAL HIGH (ref 4.0–10.5)
nRBC: 0 % (ref 0.0–0.2)
nRBC: 0 /100 WBC

## 2019-02-12 LAB — COMPREHENSIVE METABOLIC PANEL
ALT: 15 U/L (ref 0–44)
AST: 23 U/L (ref 15–41)
Albumin: 4.1 g/dL (ref 3.5–5.0)
Alkaline Phosphatase: 84 U/L (ref 38–126)
Anion gap: 13 (ref 5–15)
BUN: 22 mg/dL — ABNORMAL HIGH (ref 6–20)
CO2: 25 mmol/L (ref 22–32)
Calcium: 9.7 mg/dL (ref 8.9–10.3)
Chloride: 102 mmol/L (ref 98–111)
Creatinine, Ser: 1.26 mg/dL — ABNORMAL HIGH (ref 0.44–1.00)
GFR calc Af Amer: 54 mL/min — ABNORMAL LOW (ref >60)
GFR calc non Af Amer: 47 mL/min — ABNORMAL LOW (ref >60)
Glucose, Bld: 127 mg/dL — ABNORMAL HIGH (ref 70–99)
Potassium: 3.4 mmol/L — ABNORMAL LOW (ref 3.5–5.1)
Sodium: 140 mmol/L (ref 135–145)
Total Bilirubin: 0.5 mg/dL (ref 0.3–1.2)
Total Protein: 7.1 g/dL (ref 6.5–8.1)

## 2019-02-12 LAB — TROPONIN I
Troponin I: 6.05 ng/mL (ref <0.03)
Troponin I: 10.56 ng/mL (ref <0.03)
Troponin I: 10.61 ng/mL (ref <0.03)

## 2019-02-12 LAB — MAGNESIUM: Magnesium: 2 mg/dL (ref 1.7–2.4)

## 2019-02-12 LAB — I-STAT TROPONIN, ED
Troponin i, poc: 0.06 ng/mL (ref 0.00–0.08)
Troponin i, poc: 1.5 ng/mL (ref 0.00–0.08)

## 2019-02-12 LAB — CREATININE, SERUM
Creatinine, Ser: 1.28 mg/dL — ABNORMAL HIGH (ref 0.44–1.00)
GFR calc Af Amer: 53 mL/min — ABNORMAL LOW (ref >60)
GFR calc non Af Amer: 46 mL/min — ABNORMAL LOW (ref >60)

## 2019-02-12 LAB — ECHOCARDIOGRAM COMPLETE
Height: 59.5 in
Weight: 1649.6 oz

## 2019-02-12 LAB — POCT ACTIVATED CLOTTING TIME: Activated Clotting Time: 610 seconds

## 2019-02-12 LAB — BRAIN NATRIURETIC PEPTIDE: B Natriuretic Peptide: 910.2 pg/mL — ABNORMAL HIGH (ref 0.0–100.0)

## 2019-02-12 SURGERY — LEFT HEART CATH AND CORS/GRAFTS ANGIOGRAPHY
Anesthesia: LOCAL

## 2019-02-12 MED ORDER — BIVALIRUDIN TRIFLUOROACETATE 250 MG IV SOLR
INTRAVENOUS | Status: AC
  Filled 2019-02-12: qty 250

## 2019-02-12 MED ORDER — FENTANYL CITRATE (PF) 100 MCG/2ML IJ SOLN
INTRAMUSCULAR | Status: DC | PRN
  Administered 2019-02-12 (×2): 25 ug via INTRAVENOUS

## 2019-02-12 MED ORDER — POTASSIUM CHLORIDE CRYS ER 20 MEQ PO TBCR
40.0000 meq | EXTENDED_RELEASE_TABLET | Freq: Once | ORAL | Status: AC
  Administered 2019-02-12: 40 meq via ORAL
  Filled 2019-02-12: qty 2

## 2019-02-12 MED ORDER — FUROSEMIDE 10 MG/ML IJ SOLN: 60.0000 mg | Freq: Once | INTRAMUSCULAR | Status: DC

## 2019-02-12 MED ORDER — HEART ATTACK BOUNCING BOOK
Freq: Once | Status: AC
  Administered 2019-02-12: 20:00:00 1

## 2019-02-12 MED ORDER — MIDAZOLAM HCL 2 MG/2ML IJ SOLN
INTRAMUSCULAR | Status: DC | PRN
  Administered 2019-02-12: 1 mg via INTRAVENOUS

## 2019-02-12 MED ORDER — FUROSEMIDE 10 MG/ML IJ SOLN
60.0000 mg | Freq: Once | INTRAMUSCULAR | Status: AC
  Administered 2019-02-12: 60 mg via INTRAVENOUS
  Filled 2019-02-12: qty 6

## 2019-02-12 MED ORDER — SODIUM CHLORIDE 0.9% FLUSH: 3.0000 mL | Freq: Two times a day (BID) | INTRAVENOUS | Status: DC

## 2019-02-12 MED ORDER — ASPIRIN 81 MG PO CHEW: 81.0000 mg | CHEWABLE_TABLET | ORAL | Status: DC

## 2019-02-12 MED ORDER — HEPARIN BOLUS VIA INFUSION
2500.0000 [IU] | Freq: Once | INTRAVENOUS | Status: AC
  Administered 2019-02-12: 2500 [IU] via INTRAVENOUS
  Filled 2019-02-12: qty 2500

## 2019-02-12 MED ORDER — HEPARIN (PORCINE) IN NACL 1000-0.9 UT/500ML-% IV SOLN
INTRAVENOUS | Status: DC | PRN
  Administered 2019-02-12: 500 mL

## 2019-02-12 MED ORDER — THE SENSUOUS HEART BOOK
Freq: Once | Status: AC
  Administered 2019-02-12: 20:00:00 1

## 2019-02-12 MED ORDER — FENTANYL CITRATE (PF) 100 MCG/2ML IJ SOLN
INTRAMUSCULAR | Status: AC
  Filled 2019-02-12: qty 2

## 2019-02-12 MED ORDER — SODIUM CHLORIDE 0.9 % IV SOLN
INTRAVENOUS | Status: DC | PRN
  Administered 2019-02-12: 1.75 mg/kg/h via INTRAVENOUS

## 2019-02-12 MED ORDER — SODIUM CHLORIDE 0.9% FLUSH: 3.0000 mL | INTRAVENOUS | Status: DC | PRN

## 2019-02-12 MED ORDER — TICAGRELOR 90 MG PO TABS
180.0000 mg | ORAL_TABLET | Freq: Once | ORAL | Status: AC
  Administered 2019-02-12: 180 mg via ORAL
  Filled 2019-02-12: qty 2

## 2019-02-12 MED ORDER — ASPIRIN EC 81 MG PO TBEC
81.0000 mg | DELAYED_RELEASE_TABLET | Freq: Every day | ORAL | Status: DC
  Administered 2019-02-13 – 2019-02-14 (×2): 81 mg via ORAL
  Filled 2019-02-12 (×2): qty 1

## 2019-02-12 MED ORDER — ENOXAPARIN SODIUM 40 MG/0.4ML ~~LOC~~ SOLN
40.0000 mg | SUBCUTANEOUS | Status: DC
  Administered 2019-02-13: 40 mg via SUBCUTANEOUS
  Filled 2019-02-12: qty 0.4

## 2019-02-12 MED ORDER — VERAPAMIL HCL 2.5 MG/ML IV SOLN
INTRAVENOUS | Status: AC
  Filled 2019-02-12: qty 2

## 2019-02-12 MED ORDER — LIDOCAINE HCL (PF) 1 % IJ SOLN
INTRAMUSCULAR | Status: AC
  Filled 2019-02-12: qty 30

## 2019-02-12 MED ORDER — SODIUM CHLORIDE 0.9 % IV SOLN
INTRAVENOUS | Status: DC
  Administered 2019-02-12: 08:00:00 via INTRAVENOUS

## 2019-02-12 MED ORDER — NITROGLYCERIN 1 MG/10 ML FOR IR/CATH LAB
INTRA_ARTERIAL | Status: AC
  Filled 2019-02-12: qty 10

## 2019-02-12 MED ORDER — MORPHINE SULFATE (PF) 2 MG/ML IV SOLN
2.0000 mg | INTRAVENOUS | Status: DC | PRN
  Administered 2019-02-12: 2 mg via INTRAVENOUS
  Filled 2019-02-12: qty 1

## 2019-02-12 MED ORDER — ANGIOPLASTY BOOK
Freq: Once | Status: AC
  Administered 2019-02-12: 20:00:00 1

## 2019-02-12 MED ORDER — SODIUM CHLORIDE 0.9% FLUSH
3.0000 mL | Freq: Two times a day (BID) | INTRAVENOUS | Status: DC
  Administered 2019-02-12: 3 mL via INTRAVENOUS

## 2019-02-12 MED ORDER — SODIUM CHLORIDE 0.9 % IV SOLN: 250.0000 mL | INTRAVENOUS | Status: DC | PRN

## 2019-02-12 MED ORDER — BIVALIRUDIN BOLUS VIA INFUSION - CUPID
INTRAVENOUS | Status: DC | PRN
  Administered 2019-02-12: 35.1 mg via INTRAVENOUS

## 2019-02-12 MED ORDER — SODIUM CHLORIDE 0.9% FLUSH
3.0000 mL | Freq: Two times a day (BID) | INTRAVENOUS | Status: DC
  Administered 2019-02-12: 12:00:00 3 mL via INTRAVENOUS

## 2019-02-12 MED ORDER — LIDOCAINE HCL (PF) 1 % IJ SOLN
INTRAMUSCULAR | Status: DC | PRN
  Administered 2019-02-12: 12 mL

## 2019-02-12 MED ORDER — HEPARIN (PORCINE) 25000 UT/250ML-% IV SOLN
550.0000 [IU]/h | INTRAVENOUS | Status: DC
  Administered 2019-02-12: 550 [IU]/h via INTRAVENOUS
  Filled 2019-02-12: qty 250

## 2019-02-12 MED ORDER — ACETAMINOPHEN 325 MG PO TABS: 650.0000 mg | ORAL_TABLET | ORAL | Status: DC | PRN

## 2019-02-12 MED ORDER — HEPARIN (PORCINE) IN NACL 1000-0.9 UT/500ML-% IV SOLN
INTRAVENOUS | Status: AC
  Filled 2019-02-12: qty 1000

## 2019-02-12 MED ORDER — ATORVASTATIN CALCIUM 40 MG PO TABS
80.0000 mg | ORAL_TABLET | Freq: Every day | ORAL | Status: DC
  Administered 2019-02-12 – 2019-02-13 (×2): 80 mg via ORAL
  Filled 2019-02-12: qty 2
  Filled 2019-02-12 (×2): qty 1

## 2019-02-12 MED ORDER — IOHEXOL 350 MG/ML SOLN
INTRAVENOUS | Status: DC | PRN
  Administered 2019-02-12: 95 mL via INTRA_ARTERIAL

## 2019-02-12 MED ORDER — MIDAZOLAM HCL 2 MG/2ML IJ SOLN
INTRAMUSCULAR | Status: AC
  Filled 2019-02-12: qty 2

## 2019-02-12 MED ORDER — NITROGLYCERIN 1 MG/10 ML FOR IR/CATH LAB
INTRA_ARTERIAL | Status: DC | PRN
  Administered 2019-02-12: 200 ug via INTRACORONARY

## 2019-02-12 MED ORDER — TICAGRELOR 90 MG PO TABS
90.0000 mg | ORAL_TABLET | Freq: Two times a day (BID) | ORAL | Status: DC
  Administered 2019-02-12 – 2019-02-14 (×4): 90 mg via ORAL
  Filled 2019-02-12 (×4): qty 1

## 2019-02-12 MED ORDER — SODIUM CHLORIDE 0.9 % IV SOLN
INTRAVENOUS | Status: DC | PRN
  Administered 2019-02-12: 10 mL/h via INTRAVENOUS

## 2019-02-12 MED ORDER — ONDANSETRON HCL 4 MG/2ML IJ SOLN: 4.0000 mg | Freq: Four times a day (QID) | INTRAMUSCULAR | Status: DC | PRN

## 2019-02-12 SURGICAL SUPPLY — 21 items
BALLN SAPPHIRE 2.0X12 (BALLOONS) ×2
BALLN SAPPHIRE ~~LOC~~ 2.75X12 (BALLOONS) ×1 IMPLANT
BALLOON SAPPHIRE 2.0X12 (BALLOONS) IMPLANT
CATH INFINITI 5FR MULTPACK ANG (CATHETERS) ×1 IMPLANT
CATH VISTA GUIDE 6FR XB3.5 (CATHETERS) ×1 IMPLANT
CLOSURE MYNX CONTROL 6F/7F (Vascular Products) ×1 IMPLANT
GLIDESHEATH SLEND SS 6F .021 (SHEATH) IMPLANT
GUIDEWIRE INQWIRE 1.5J.035X260 (WIRE) IMPLANT
INQWIRE 1.5J .035X260CM (WIRE) ×2
KIT ENCORE 26 ADVANTAGE (KITS) ×1 IMPLANT
KIT HEART LEFT (KITS) ×2 IMPLANT
KIT MICROPUNCTURE NIT STIFF (SHEATH) ×1 IMPLANT
PACK CARDIAC CATHETERIZATION (CUSTOM PROCEDURE TRAY) ×2 IMPLANT
SHEATH PINNACLE 5F 10CM (SHEATH) ×1 IMPLANT
SHEATH PINNACLE 6F 10CM (SHEATH) ×1 IMPLANT
SHEATH PROBE COVER 6X72 (BAG) ×1 IMPLANT
STENT RESOLUTE ONYX 2.25X15 (Permanent Stent) ×1 IMPLANT
TRANSDUCER W/STOPCOCK (MISCELLANEOUS) ×2 IMPLANT
TUBING CIL FLEX 10 FLL-RA (TUBING) ×2 IMPLANT
WIRE HI TORQ VERSACORE-J 145CM (WIRE) ×1 IMPLANT
WIRE RUNTHROUGH .014X180CM (WIRE) ×1 IMPLANT

## 2019-02-12 NOTE — ED Notes (Signed)
ED TO INPATIENT HANDOFF REPORT  ED Nurse Name and Phone #: Suezanne Jacquet [phone]  S Name/Age/Gender Brittany [name] 58 y.o. female Room/Bed: 035C/035C  Code Status   Code Status: Prior  Home/SNF/Other Home Patient oriented to: self, place, time and situation Is this baseline? Yes   Triage Complete: Triage complete  Chief Complaint chest pain   Triage Note BIB [facility] from home with c/o of sudden onset Chest Pain and SOB. Pt states the pain resembles her previous MI's. Received 324 aspirin and 1 nitro PTA without relief.    Allergies Allergies  Allergen Reactions  . Codeine Nausea And Vomiting    Level of Care/Admitting Diagnosis ED Disposition    ED Disposition Condition Comment   Admit  Hospital Area: [facility] [[id number]]  Level of Care: Cardiac Telemetry [103]  Diagnosis: NSTEMI (non-ST elevated myocardial infarction) [facility]) [[id number]]  Admitting Physician: Herbert Seta  Attending Physician: Herbert Seta  Estimated length of stay: inpatient only procedure  Certification:: I certify this patient is being admitted for an inpatient-only procedure  PT Class (Do Not Modify): Inpatient [101]  PT Acc Code (Do Not Modify): Private [1]       B Medical/Surgery History Past Medical History:  Diagnosis Date  . Acute systolic congestive heart failure ([place]) 02/03/2018  . Allergy   . Anxiety   . Asthma   . Hypertension   . PAF (paroxysmal atrial fibrillation) ([place])   . Prophylactic measure 08/03/14-08/19/14   Prophyl. cranial radiation 24 Gy  . 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 Small cell lung cancer ([place]) 03/16/2014   Past Surgical History:  Procedure Laterality Date  . CARDIAC DEFIBRILLATOR PLACEMENT  08/15/2018   MDT Visia AF MRI VR ICD implanted by Dr Loel Lofty for primary prevention of sudden  . CESAREAN  SECTION    . CORONARY ARTERY BYPASS GRAFT N/A 02/03/2018   Procedure: CORONARY ARTERY BYPASS GRAFTING (CABG);  Surgeon: Rexene Alberts, MD;  Location: [facility];  Service: Open Heart Surgery;  Laterality: N/A;  Time 3 using left internal mammary artery and endoscopically harvested right saphenous vein  . CORONARY BALLOON ANGIOPLASTY N/A 02/03/2018   Procedure: CORONARY BALLOON ANGIOPLASTY;  Surgeon: Martinique, Peter M, MD;  Location: [facility];  Service: Cardiovascular;  Laterality: N/A;  . CORONARY/GRAFT ACUTE MI REVASCULARIZATION N/A 02/03/2018   Procedure: Coronary/Graft Acute MI Revascularization;  Surgeon: Martinique, Peter M, MD;  Location: [facility];  Service: Cardiovascular;  Laterality: N/A;  . IABP INSERTION N/A 02/03/2018   Procedure: IABP Insertion;  Surgeon: Martinique, Peter M, MD;  Location: [facility];  Service: Cardiovascular;  Laterality: N/A;  . INTRAOPERATIVE TRANSESOPHAGEAL ECHOCARDIOGRAM N/A 02/03/2018   Procedure: INTRAOPERATIVE TRANSESOPHAGEAL ECHOCARDIOGRAM;  Surgeon: Rexene Alberts, MD;  Location: [facility];  Service: Open Heart Surgery;  Laterality: N/A;  . LEFT HEART CATH AND CORONARY ANGIOGRAPHY N/A 02/03/2018   Procedure: LEFT HEART CATH AND CORONARY ANGIOGRAPHY;  Surgeon: Martinique, Peter M, MD;  Location: [facility];  Service: Cardiovascular;  Laterality: N/A;  . MEDIASTINOSCOPY N/A 03/11/2014   Procedure: MEDIASTINOSCOPY;  Surgeon: Melrose Nakayama, MD;  Location: [facility];  Service: Thoracic;  Laterality: N/A;  . PLACEMENT OF [place] LEFT VENTRICULAR ASSIST DEVICE  02/03/2018   Procedure: PLACEMENT OF [place] LEFT VENTRICULAR ASSIST DEVICE LD;  Surgeon:  Rexene Alberts, MD;  Location: [facility];  Service: Open Heart Surgery;;  . REMOVAL OF IMPELLA LEFT VENTRICULAR ASSIST DEVICE N/A 02/08/2018   Procedure: REMOVAL OF [place] LEFT VENTRICULAR ASSIST DEVICE;  Surgeon: Rexene Alberts, MD;  Location: [facility];  Service: Open Heart Surgery;  Laterality: N/A;  . RIGHT  HEART CATH N/A 02/03/2018   Procedure: RIGHT HEART CATH;  Surgeon: Martinique, Peter M, MD;  Location: [facility];  Service: Cardiovascular;  Laterality: N/A;  . RIGHT HEART CATH N/A 05/09/2018   Procedure: RIGHT HEART CATH;  Surgeon: Jolaine Artist, MD;  Location: [facility];  Service: Cardiovascular;  Laterality: N/A;  . TEE WITHOUT CARDIOVERSION N/A 02/08/2018   Procedure: TRANSESOPHAGEAL ECHOCARDIOGRAM (TEE);  Surgeon: Rexene Alberts, MD;  Location: [facility];  Service: Open Heart Surgery;  Laterality: N/A;  . TUBAL LIGATION    . VIDEO BRONCHOSCOPY WITH ENDOBRONCHIAL ULTRASOUND N/A 03/11/2014   Procedure: VIDEO BRONCHOSCOPY WITH ENDOBRONCHIAL ULTRASOUND;  Surgeon: Melrose Nakayama, MD;  Location: [facility];  Service: Thoracic;  Laterality: N/A;     A IV Location/Drains/Wounds Patient Lines/Drains/Airways Status   Active Line/Drains/Airways    Name:   Placement date:   Placement time:   Site:   Days:   Peripheral IV 02/12/19 Left Hand   02/12/19    0259    Hand   less than 1   Peripheral IV 02/12/19 Right Forearm   02/12/19    0259    Forearm   less than 1          Intake/Output Last 24 hours No intake or output data in the 24 hours ending 02/12/19 0551  Labs/Imaging Results for orders placed or performed during the hospital encounter of 02/12/19 (from the past 48 hour(s))  CBC with Differential     Status: Abnormal   Collection Time: 02/12/19  2:55 AM  Result Value Ref Range   WBC 11.1 (H) 4.0 - 10.5 K/uL   RBC 5.08 3.87 - 5.11 MIL/uL   Hemoglobin 14.2 12.0 - 15.0 g/dL   HCT 45.9 36.0 - 46.0 %   MCV 90.4 80.0 - 100.0 fL   MCH 28.0 26.0 - 34.0 pg   MCHC 30.9 30.0 - 36.0 g/dL   RDW 21.6 (H) 11.5 - 15.5 %   Platelets 202 150 - 400 K/uL   nRBC 0.0 0.0 - 0.2 %   Neutrophils Relative % 82 %   Lymphocytes Relative 10 %   Monocytes Relative 5 %   Eosinophils Relative 2 %   Basophils Relative 1 %   Band Neutrophils 0 %   Metamyelocytes Relative 0 %   Myelocytes 0 %    Promyelocytes Relative 0 %   Blasts 0 %   nRBC 0 0 /100 WBC   Other 0 %   Neutro Abs 9.1 (H) 1.7 - 7.7 K/uL   Lymphs Abs 1.1 0.7 - 4.0 K/uL   Monocytes Absolute 0.6 0.1 - 1.0 K/uL   Eosinophils Absolute 0.2 0.0 - 0.5 K/uL   Basophils Absolute 0.1 0.0 - 0.1 K/uL    Comment: Performed at [facility], [address] [place], [place] [address]  Comprehensive metabolic panel     Status: Abnormal   Collection Time: 02/12/19  2:55 AM  Result Value Ref Range   Sodium 140 135 - 145 mmol/L   Potassium 3.4 (L) 3.5 - 5.1 mmol/L   Chloride 102 98 - 111 mmol/L   CO2 25 22 -  32 mmol/L   Glucose, Bld 127 (H) 70 - 99 mg/dL   BUN 22 (H) 6 - 20 mg/dL   Creatinine, Ser 1.26 (H) 0.44 - 1.00 mg/dL   Calcium 9.7 8.9 - 10.3 mg/dL   Total Protein 7.1 6.5 - 8.1 g/dL   Albumin 4.1 3.5 - 5.0 g/dL   AST 23 15 - 41 U/L   ALT 15 0 - 44 U/L   Alkaline Phosphatase 84 38 - 126 U/L   Total Bilirubin 0.5 0.3 - 1.2 mg/dL   GFR calc non Af Amer 47 (L) >60 mL/min   GFR calc Af Amer 54 (L) >60 mL/min   Anion gap 13 5 - 15    Comment: Performed at [facility] [address], [place], [place] [address]  Brain natriuretic peptide     Status: Abnormal   Collection Time: 02/12/19  2:55 AM  Result Value Ref Range   B Natriuretic Peptide 910.2 (H) 0.0 - 100.0 pg/mL    Comment: Performed at [facility] [address], [place], [place] [address]  I-Stat Troponin, ED (not at [facility])     Status: None   Collection Time: 02/12/19  3:16 AM  Result Value Ref Range   Troponin i, poc 0.06 0.00 - 0.08 ng/mL   Comment 3            Comment: Due to the release kinetics of cTnI, a negative result within the first hours of the onset of symptoms does not rule out myocardial infarction with certainty. If myocardial infarction is still suspected, repeat the test at appropriate intervals.   I-Stat Troponin, ED (not at [facility])     Status: Abnormal   Collection Time: 02/12/19  4:39 AM  Result Value Ref Range    Troponin i, poc 1.50 (HH) 0.00 - 0.08 ng/mL   Comment NOTIFIED PHYSICIAN    Comment 3            Comment: Due to the release kinetics of cTnI, a negative result within the first hours of the onset of symptoms does not rule out myocardial infarction with certainty. If myocardial infarction is still suspected, repeat the test at appropriate intervals.    Dg Chest 2 View  Result Date: 02/12/2019 CLINICAL DATA:  Chest pain and shortness of breath EXAM: CHEST - 2 VIEW COMPARISON:  11/24/2018, 11/15/2018 FINDINGS: Trace right pleural effusion. Post sternotomy changes. Left-sided pacing device as before. Cardiomegaly. No focal opacity. IMPRESSION: Cardiomegaly with trace right pleural effusion. Electronically Signed   By: Donavan Foil M.D.   On: 02/12/2019 03:09    Pending Labs Unresulted Labs (From admission, onward)    Start     Ordered   02/13/19 0500  Heparin level (unfractionated)  Daily,   R     02/12/19 0437   02/13/19 0500  CBC  Daily,   R     02/12/19 0437   02/12/19 1200  Heparin level (unfractionated)  Once-Timed,   R     02/12/19 0437   Signed and Held  Troponin I - Now Then Q4H  Now then every 4 hours,   STAT     Signed and Held          Vitals/Pain Today's Vitals   02/12/19 0345 02/12/19 0400 02/12/19 0415 02/12/19 0436  BP: 113/64 (!) 96/56 (!) 92/55   Pulse: 65 63 68   Resp: 16 15 16    Temp:      TempSrc:  SpO2: 100% 99% 99%   Weight:    45 kg  Height:    5' (1.524 m)  PainSc:        Isolation Precautions No active isolations  Medications Medications  heparin ADULT infusion 100 units/mL (25000 units/224mL sodium chloride 0.45%) (550 Units/hr Intravenous New Bag/Given 02/12/19 0456)  furosemide (LASIX) injection 60 mg (has no administration in time range)  ticagrelor (BRILINTA) tablet 180 mg (has no administration in time range)  heparin bolus via infusion 2,500 Units (2,500 Units Intravenous Bolus from Bag 02/12/19 0457)     Mobility non-ambulatory Low fall risk   Focused Assessments Cardiac Assessment Handoff:  Cardiac Rhythm: Normal sinus rhythm Lab Results  Component Value Date   TROPONINI 0.03 (HH) 11/24/2018   Lab Results  Component Value Date   DDIMER 1.07 (H) 02/03/2018   Does the Patient currently have chest pain? Yes     R Recommendations: See Admitting Provider Note  Report given to:   Additional Notes: N/A

## 2019-02-12 NOTE — Progress Notes (Signed)
  Echocardiogram 2D Echocardiogram has been performed.  [name] M 02/12/2019, 2:57 PM

## 2019-02-12 NOTE — Progress Notes (Signed)
Pt with continued chest pain,  Have ordered EKG, will check stat troponin if elevated further will need more urgent cath  Ordered Kdur 40 meq and ordered Mag level.  Also gave morphine due to Bp 574 systolic

## 2019-02-12 NOTE — Progress Notes (Signed)
Patient complaining of 5/10 left sided chest pain.  Per patient feels like very bad heartburn which is pain similar to her previous heart attack.  BP 103/60.  Patient just received 60mg  IV push lasix per order.  Cardiology paged with this information.

## 2019-02-12 NOTE — ED Triage Notes (Signed)
BIB [facility] from home with c/o of sudden onset Chest Pain and SOB. Pt states the pain resembles her previous MI's. Received 324 aspirin and 1 nitro PTA without relief.

## 2019-02-12 NOTE — Care Management Note (Signed)
Case Management Note  Patient Details  Name: Brittany [name] MRN: [id number] Date of Birth: 06-Feb-1961  Subjective/Objective:   From home, s/p stent intervention, will be on brilinta, NCM informed patient of the co pay of 3.90.  Also informed her the cardiologist has samples in the office.                  Action/Plan: DC home when ready.  Expected Discharge Date:                  Expected Discharge Plan:  Home/Self Care  In-House Referral:     Discharge planning Services  Medication Assistance  Post Acute Care Choice:    Choice offered to:     DME Arranged:    DME Agency:     HH Arranged:    HH Agency:     Status of Service:  Completed, signed off  If discussed at [employer] of Stay Meetings, dates discussed:    Additional Comments:  Zenon Mayo, RN 02/12/2019, 4:34 PM

## 2019-02-12 NOTE — ED Provider Notes (Signed)
[facility] EMERGENCY DEPARTMENT Provider Note   CSN: [id number] Arrival date & time: 02/12/19  0227    History   Chief Complaint Chief Complaint  Patient presents with  . Chest Pain    HPI Brittany [name] is a 58 y.o. female with history of emergency CABG x3, paroxysmal atrial fibrillation, hypertension, CHF who presents with chest pain is left-sided and began prior to arrival.  Patient had associated nausea and vomiting.  She describes the pain as really bad heartburn.  She reports it feels similar to the last time she had a heart attack.  She denies any abdominal pain.  The pain does not seem to radiate.  She reports feeling lightheaded throughout the day today.  Patient is followed by Dr. [name] at the heart failure clinic.     HPI  Past Medical History:  Diagnosis Date  . Acute systolic congestive heart failure ([place]) 02/03/2018  . Allergy   . Anxiety   . Asthma   . Hypertension   . PAF (paroxysmal atrial fibrillation) ([place])   . Prophylactic measure 08/03/14-08/19/14   Prophyl. cranial radiation 24 Gy  . 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 Small cell lung cancer ([place]) 03/16/2014    Patient Active Problem List   Diagnosis Date Noted  . NSTEMI (non-ST elevated myocardial infarction) ([place]) 02/12/2019  . CHF (congestive heart failure) ([place]) 11/24/2018  . Recurrent pleural effusion on right 05/05/2018  . Acute respiratory failure ([place]) 05/05/2018  . Dyspnea 04/19/2018  . Chronic respiratory failure with hypoxia ([place])   . Acute on chronic systolic CHF (congestive heart failure) ([place]) 02/03/2018  . S/P CABG (coronary artery bypass graft) 02/03/2018  . Asthma   . Anxiety   . Allergy   . HCAP (healthcare-associated pneumonia) 03/15/2015  . Chest pain 03/15/2015  . Small cell lung cancer ([place]) 03/16/2014  . Protein-calorie  malnutrition, severe ([place]) 03/14/2014  . Atrial fibrillation ([place]) 03/12/2014  . HTN (hypertension) 05/07/2012    Past Surgical History:  Procedure Laterality Date  . CARDIAC DEFIBRILLATOR PLACEMENT  08/15/2018   MDT Visia AF MRI VR ICD implanted by Dr Loel Lofty for primary prevention of sudden  . CESAREAN SECTION    . CORONARY ARTERY BYPASS GRAFT N/A 02/03/2018   Procedure: CORONARY ARTERY BYPASS GRAFTING (CABG);  Surgeon: Rexene Alberts, MD;  Location: [facility];  Service: Open Heart Surgery;  Laterality: N/A;  Time 3 using left internal mammary artery and endoscopically harvested right saphenous vein  . CORONARY BALLOON ANGIOPLASTY N/A 02/03/2018   Procedure: CORONARY BALLOON ANGIOPLASTY;  Surgeon: Martinique, Peter M, MD;  Location: [facility];  Service: Cardiovascular;  Laterality: N/A;  . CORONARY/GRAFT ACUTE MI REVASCULARIZATION N/A 02/03/2018   Procedure: Coronary/Graft Acute MI Revascularization;  Surgeon: Martinique, Peter M, MD;  Location: [facility];  Service: Cardiovascular;  Laterality: N/A;  . IABP INSERTION N/A 02/03/2018   Procedure: IABP Insertion;  Surgeon: Martinique, Peter M, MD;  Location: [facility];  Service: Cardiovascular;  Laterality: N/A;  . INTRAOPERATIVE TRANSESOPHAGEAL ECHOCARDIOGRAM N/A 02/03/2018   Procedure: INTRAOPERATIVE TRANSESOPHAGEAL ECHOCARDIOGRAM;  Surgeon: Rexene Alberts, MD;  Location: [facility];  Service: Open Heart Surgery;  Laterality: N/A;  . LEFT HEART CATH AND CORONARY ANGIOGRAPHY N/A 02/03/2018   Procedure: LEFT HEART CATH AND CORONARY ANGIOGRAPHY;  Surgeon: Martinique, Peter M, MD;  Location: [facility];  Service: Cardiovascular;  Laterality: N/A;  . MEDIASTINOSCOPY N/A 03/11/2014   Procedure: MEDIASTINOSCOPY;  Surgeon: Melrose Nakayama, MD;  Location: [facility];  Service: Thoracic;  Laterality: N/A;  . PLACEMENT OF [place] LEFT VENTRICULAR ASSIST DEVICE  02/03/2018   Procedure: PLACEMENT OF [place] LEFT VENTRICULAR ASSIST DEVICE LD;   Surgeon: Rexene Alberts, MD;  Location: [facility];  Service: Open Heart Surgery;;  . REMOVAL OF IMPELLA LEFT VENTRICULAR ASSIST DEVICE N/A 02/08/2018   Procedure: REMOVAL OF [place] LEFT VENTRICULAR ASSIST DEVICE;  Surgeon: Rexene Alberts, MD;  Location: [facility];  Service: Open Heart Surgery;  Laterality: N/A;  . RIGHT HEART CATH N/A 02/03/2018   Procedure: RIGHT HEART CATH;  Surgeon: Martinique, Peter M, MD;  Location: [facility];  Service: Cardiovascular;  Laterality: N/A;  . RIGHT HEART CATH N/A 05/09/2018   Procedure: RIGHT HEART CATH;  Surgeon: Jolaine Artist, MD;  Location: [facility];  Service: Cardiovascular;  Laterality: N/A;  . TEE WITHOUT CARDIOVERSION N/A 02/08/2018   Procedure: TRANSESOPHAGEAL ECHOCARDIOGRAM (TEE);  Surgeon: Rexene Alberts, MD;  Location: [facility];  Service: Open Heart Surgery;  Laterality: N/A;  . TUBAL LIGATION    . VIDEO BRONCHOSCOPY WITH ENDOBRONCHIAL ULTRASOUND N/A 03/11/2014   Procedure: VIDEO BRONCHOSCOPY WITH ENDOBRONCHIAL ULTRASOUND;  Surgeon: Melrose Nakayama, MD;  Location: [facility];  Service: Thoracic;  Laterality: N/A;     OB History   No obstetric history on file.      Home Medications    Prior to Admission medications   Medication Sig Start Date End Date Taking? Authorizing Provider  albuterol (PROVENTIL HFA;VENTOLIN HFA) 108 (90 Base) MCG/ACT inhaler Inhale 2 puffs into the lungs every 6 (six) hours as needed for wheezing or shortness of breath.   Yes [provider]  ALPRAZolam (XANAX) 0.25 MG tablet Take 0.25 mg by mouth every 8 (eight) hours as needed for anxiety.  11/10/18  Yes [provider]  aspirin 81 MG EC tablet Take 1 tablet (81 mg total) by mouth daily. 07/03/18  Yes Bensimhon, Shaune Pascal, MD  atorvastatin (LIPITOR) 80 MG tablet Take 1 tablet (80 mg total) by mouth daily at 6 PM. 07/03/18  Yes Bensimhon, Shaune Pascal, MD  ipratropium-albuterol (DUONEB) 0.5-2.5 (3) MG/3ML SOLN Take 3 mLs by nebulization 2 (two) times  daily. Patient taking differently: Take 3 mLs by nebulization daily as needed (SOB).  05/23/18  Yes Bensimhon, Shaune Pascal, MD  ivabradine (CORLANOR) 7.5 MG TABS tablet Take 1.5 tablets (11.25 mg total) by mouth 2 (two) times daily with a meal. 12/05/18  Yes Bensimhon, Shaune Pascal, MD  nitroGLYCERIN (NITROSTAT) 0.4 MG SL tablet Place 1 tablet (0.4 mg total) under the tongue every 5 (five) minutes as needed for chest pain. 05/23/18  Yes Bensimhon, Shaune Pascal, MD  oxyCODONE (OXY IR/ROXICODONE) 5 MG immediate release tablet Take 5 mg by mouth every 6 (six) hours as needed for moderate pain.  10/04/18  Yes [provider]  spironolactone (ALDACTONE) 25 MG tablet Take 1 tablet (25 mg total) by mouth daily. 02/10/19  Yes Bensimhon, Shaune Pascal, MD  torsemide (DEMADEX) 20 MG tablet Take 3 tablets (60 mg total) by mouth daily. Take additional as directed by HF team. 11/26/18  Yes Shirley Friar, PA-C    Family History Family History  Problem Relation Age of Onset  . Heart disease Mother   . Hypertension Mother   . Heart attack Mother   .  Hypertension Maternal Grandmother   . Cancer Maternal Grandmother   . Diabetes Paternal Grandmother   . Stroke Neg Hx     Social History Social History   Tobacco Use  . Smoking status: Former Smoker    Packs/day: 1.00    Years: 20.00    Pack years: 20.00    Types: Cigarettes    Last attempt to quit: 03/13/2014    Years since quitting: 4.9  . Smokeless tobacco: Never Used  Substance Use Topics  . Alcohol use: Yes    Alcohol/week: 8.0 standard drinks    Types: 8 Glasses of wine per week  . Drug use: No     Allergies   Codeine   Review of Systems Review of Systems  Constitutional: Negative for chills and fever.  HENT: Negative for facial swelling and sore throat.   Respiratory: Positive for cough and shortness of breath.   Cardiovascular: Positive for chest pain.  Gastrointestinal: Positive for nausea and vomiting. Negative for abdominal  pain.  Genitourinary: Negative for dysuria.  Musculoskeletal: Negative for back pain.  Skin: Negative for rash and wound.  Neurological: Negative for headaches.  Psychiatric/Behavioral: The patient is not nervous/anxious.      Physical Exam Updated Vital Signs BP (!) 92/55   Pulse 68   Temp 98.4 F (36.9 C) (Oral)   Resp 16   Ht 5' (1.524 m)   Wt 45 kg   SpO2 99%   BMI 19.38 kg/m   Physical Exam Vitals signs and nursing note reviewed.  Constitutional:      General: She is not in acute distress.    Appearance: She is well-developed. She is not diaphoretic.  HENT:     Head: Normocephalic and atraumatic.     Mouth/Throat:     Pharynx: No oropharyngeal exudate.  Eyes:     General: No scleral icterus.       Right eye: No discharge.        Left eye: No discharge.     Conjunctiva/sclera: Conjunctivae normal.     Pupils: Pupils are equal, round, and reactive to light.  Neck:     Musculoskeletal: Normal range of motion and neck supple.     Thyroid: No thyromegaly.  Cardiovascular:     Rate and Rhythm: Normal rate and regular rhythm.     Heart sounds: Normal heart sounds. No murmur. No friction rub. No gallop.   Pulmonary:     Effort: Pulmonary effort is normal. No respiratory distress.     Breath sounds: No stridor. Rhonchi (bilaterally) present. No wheezing or rales.  Abdominal:     General: Bowel sounds are normal. There is no distension.     Palpations: Abdomen is soft.     Tenderness: There is no abdominal tenderness. There is no guarding or rebound.  Musculoskeletal:     Right lower leg: She exhibits no tenderness. No edema.     Left lower leg: She exhibits no tenderness. No edema.  Lymphadenopathy:     Cervical: No cervical adenopathy.  Skin:    General: Skin is warm and dry.     Coloration: Skin is not pale.     Findings: No rash.  Neurological:     Mental Status: She is alert.     Coordination: Coordination normal.      ED Treatments / Results   Labs (all labs ordered are listed, but only abnormal results are displayed) Labs Reviewed  CBC WITH DIFFERENTIAL/PLATELET - Abnormal; Notable for the following components:  Result Value   WBC 11.1 (*)    RDW 21.6 (*)    Neutro Abs 9.1 (*)    All other components within normal limits  COMPREHENSIVE METABOLIC PANEL - Abnormal; Notable for the following components:   Potassium 3.4 (*)    Glucose, Bld 127 (*)    BUN 22 (*)    Creatinine, Ser 1.26 (*)    GFR calc non Af Amer 47 (*)    GFR calc Af Amer 54 (*)    All other components within normal limits  BRAIN NATRIURETIC PEPTIDE - Abnormal; Notable for the following components:   B Natriuretic Peptide 910.2 (*)    All other components within normal limits  I-STAT TROPONIN, ED - Abnormal; Notable for the following components:   Troponin i, poc 1.50 (*)    All other components within normal limits  HEPARIN LEVEL (UNFRACTIONATED)  I-STAT TROPONIN, ED    EKG EKG Interpretation  Date/Time:  Wednesday February 12 2019 02:32:16 EST Ventricular Rate:  74 PR Interval:    QRS Duration: 101 QT Interval:  484 QTC Calculation: 538 R Axis:   168 Text Interpretation:  Sinus rhythm Biatrial enlargement Right axis deviation Abnormal R-wave progression, late transition Consider left ventricular hypertrophy Probable lateral infarct, age indeterminate Nonspecific T abnormalities, lateral leads Prolonged QT interval Baseline wander in lead(s) V3 No significant change since last tracing Confirmed by Deno Etienne [phone]) on 02/12/2019 2:36:33 AM   Radiology Dg Chest 2 View  Result Date: 02/12/2019 CLINICAL DATA:  Chest pain and shortness of breath EXAM: CHEST - 2 VIEW COMPARISON:  11/24/2018, 11/15/2018 FINDINGS: Trace right pleural effusion. Post sternotomy changes. Left-sided pacing device as before. Cardiomegaly. No focal opacity. IMPRESSION: Cardiomegaly with trace right pleural effusion. Electronically Signed   By: Donavan Foil M.D.   On:  02/12/2019 03:09    Procedures .Critical Care Performed by: Frederica Kuster, PA-C Authorized by: Frederica Kuster, PA-C   Critical care provider statement:    Critical care time (minutes):  45   Critical care was necessary to treat or prevent imminent or life-threatening deterioration of the following conditions:  Cardiac failure   Critical care was time spent personally by me on the following activities:  Discussions with consultants, evaluation of patient's response to treatment, examination of patient, ordering and performing treatments and interventions, ordering and review of laboratory studies, ordering and review of radiographic studies, pulse oximetry, re-evaluation of patient's condition, obtaining history from patient or surrogate and review of old charts   (including critical care time)  Medications Ordered in ED Medications  heparin ADULT infusion 100 units/mL (25000 units/244mL sodium chloride 0.45%) (550 Units/hr Intravenous New Bag/Given 02/12/19 0456)  furosemide (LASIX) injection 60 mg (has no administration in time range)  ticagrelor (BRILINTA) tablet 180 mg (has no administration in time range)  heparin bolus via infusion 2,500 Units (2,500 Units Intravenous Bolus from Bag 02/12/19 0457)     Initial Impression / Assessment and Plan / ED Course  I have reviewed the triage vital signs and the nursing notes.  Pertinent labs & imaging results that were available during my care of the patient were reviewed by me and considered in my medical decision making (see chart for details).  Clinical Course as of Feb 12 551  Wed Feb 12, 2019  0513 Patient had a run of 7 PVCs on telemetry in the ED.   [AL]    Clinical Course User Index [AL] Frederica Kuster, PA-C  Patient presenting with NSTEMI.  Initial troponin negative, however second troponin 1.50.  Patient reports this feels very similar to her last MI.  Chest x-ray shows cardiomegaly with trace right pleural  effusion.  EKG shows sinus rhythm, LVH, nonspecific T abnormalities, prolonged QT, with no changes since last tracing.  I discussed patient case with cardiologist, Dr. Kalman Shan, who advised initiation with heparin and admission to the cardiology service.  I appreciate his assistance with the patient.  Patient understands and agrees with plan.  Patient also evaluated by my attending, Dr. Tyrone Nine, who guided the patient's management and agrees with plan.  Final Clinical Impressions(s) / ED Diagnoses   Final diagnoses:  NSTEMI (non-ST elevated myocardial infarction) [facility])    ED Discharge Orders    None       Frederica Kuster, PA-C 02/12/19 [place], [place], DO 02/12/19 [phone]

## 2019-02-12 NOTE — Progress Notes (Signed)
ANTICOAGULATION CONSULT NOTE - Initial Consult  Pharmacy Consult for Heparin Indication: chest pain/ACS  Allergies  Allergen Reactions  . Codeine Nausea And Vomiting    Patient Measurements:    Vital Signs: Temp: 98.4 F (36.9 C) (03/04 0238) Temp Source: Oral (03/04 0238) BP: 92/55 (03/04 0415) Pulse Rate: 68 (03/04 0415)  Labs: Recent Labs    02/12/19 0255  HGB 14.2  HCT 45.9  PLT 202  CREATININE 1.26*    CrCl cannot be calculated (Unknown ideal weight.).   Medical History: Past Medical History:  Diagnosis Date  . Acute systolic congestive heart failure ([place]) 02/03/2018  . Allergy   . Anxiety   . Asthma   . Hypertension   . PAF (paroxysmal atrial fibrillation) ([place])   . Prophylactic measure 08/03/14-08/19/14   Prophyl. cranial radiation 24 Gy  . 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 Small cell lung cancer ([place]) 03/16/2014    Medications:  No current facility-administered medications on file prior to encounter.    Current Outpatient Medications on File Prior to Encounter  Medication Sig Dispense Refill  . ALPRAZolam (XANAX) 0.25 MG tablet Take 0.25 mg by mouth every 8 (eight) hours as needed. for anxiety  0  . aspirin 81 MG EC tablet Take 1 tablet (81 mg total) by mouth daily. 30 tablet 1  . atorvastatin (LIPITOR) 80 MG tablet Take 1 tablet (80 mg total) by mouth daily at 6 PM. 30 tablet 1  . clopidogrel (PLAVIX) 75 MG tablet Take 1 tablet (75 mg total) by mouth daily. 30 tablet 1  . ipratropium-albuterol (DUONEB) 0.5-2.5 (3) MG/3ML SOLN Take 3 mLs by nebulization 2 (two) times daily. 360 mL 0  . ivabradine (CORLANOR) 7.5 MG TABS tablet Take 1.5 tablets (11.25 mg total) by mouth 2 (two) times daily with a meal. 45 tablet 3  . nitroGLYCERIN (NITROSTAT) 0.4 MG SL tablet Place 1 tablet (0.4 mg total) under the tongue every 5 (five) minutes as  needed for chest pain. 20 tablet 0  . oxyCODONE (OXY IR/ROXICODONE) 5 MG immediate release tablet Take 5 mg by mouth every 6 (six) hours as needed. for pain  0  . spironolactone (ALDACTONE) 25 MG tablet Take 1 tablet (25 mg total) by mouth daily. 30 tablet 1  . torsemide (DEMADEX) 20 MG tablet Take 3 tablets (60 mg total) by mouth daily. Take additional as directed by HF team. 110 tablet 6     Assessment: 58 y.o. female with chest pain for heparin  Goal of Therapy:  Heparin level 0.3-0.7 units/ml Monitor platelets by anticoagulation protocol: Yes   Plan:  Heparin 2500 units IV bolus, then start heparin 550 units/hr Check heparin level in 6 hours.   Caryl Pina 02/12/2019,4:34 AM

## 2019-02-12 NOTE — H&P (Signed)
History & Physical    Patient ID: Brittany [name] MRN: [id number], DOB/AGE: 1961-02-09   Admit date: 02/12/2019  Primary Physician: Maude Leriche, PA-C Primary Cardiologist: Glori Bickers, MD  Patient Profile    Brittany [name] is a 58 year old woman with a history of NSTEMI/STEMI s/p emergent 3v CABG 12/6965 with complicated post-op course, ICM (EF 20%) s/p MDT ICD, moderate AI, paroxysmal AF (not on anticoagulation), recurrent pleural effusions, and SCLC s/p chemo/XRT. She presents today with acute onset severe chest pain.  History of Present Illness    Tonight she was unable to sleep due to orthopnea, typically sleeps flat and has been doing so without issues until now. Felt very weak when she tried to get out of bed. Then was able to get up and felt acutely more short of breath with severe chest tightness (at least 8/10) and nausea/vomiting very similar to her prior MI. She called EMS 1 hour after symptom onset and was given full-dose aspirin and sublingual nitro en route. In the ED her chest pain improved somewhat to a 5/10, though still ongoing. Her vitals were significant for borderline pressures in 90s/50s so no further ntg was given. No hypoxia. ECG showed known lateral infarct and only mild non-specific ST-T changes. First troponin 0.06. I requested an immediate second troponin (1.5hr interval) that resulted at 1.5. Repeat ECG relatively unchanged. Labs were otherwise notable for BNP 910 (2479 last admission), Cr up to 1.26 (baseline 1), and WBC 11. Currently feels very weak, still mild chest tightness.  Otherwise, her weight has been stable. She was notified of Optivol changes from Feb 18-24, but did not feel any different. Denies lower extremity edema but notices that her rings have been tight. Has not been taking extra torsemide. Describes palpitations lasting 10-26min yesterday and a couple days before that similar to when she had AF. No syncope or ICD shocks. She  denies any orthopnea, PND, chest pain, or increased dyspnea preceding this episode.   Past Medical History    Past Medical History:  Diagnosis Date  . Acute systolic congestive heart failure ([place]) 02/03/2018  . Allergy   . Anxiety   . Asthma   . Hypertension   . PAF (paroxysmal atrial fibrillation) ([place])   . Prophylactic measure 08/03/14-08/19/14   Prophyl. cranial radiation 24 Gy  . 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 Small cell lung cancer ([employer]) 03/16/2014    Past Surgical History:  Procedure Laterality Date  . CARDIAC DEFIBRILLATOR PLACEMENT  08/15/2018   MDT Visia AF MRI VR ICD implanted by Dr Loel Lofty for primary prevention of sudden  . CESAREAN SECTION    . CORONARY ARTERY BYPASS GRAFT N/A 02/03/2018   Procedure: CORONARY ARTERY BYPASS GRAFTING (CABG);  Surgeon: Rexene Alberts, MD;  Location: [facility];  Service: Open Heart Surgery;  Laterality: N/A;  Time 3 using left internal mammary artery and endoscopically harvested right saphenous vein  . CORONARY BALLOON ANGIOPLASTY N/A 02/03/2018   Procedure: CORONARY BALLOON ANGIOPLASTY;  Surgeon: Martinique, Peter M, MD;  Location: [place] [facility];  Service: Cardiovascular;  Laterality: N/A;  . CORONARY/GRAFT ACUTE MI REVASCULARIZATION N/A 02/03/2018   Procedure: Coronary/Graft Acute MI Revascularization;  Surgeon: Martinique, Peter M, MD;  Location: [place] [facility];  Service: Cardiovascular;  Laterality: N/A;  . IABP INSERTION N/A 02/03/2018   Procedure: IABP  Insertion;  Surgeon: Martinique, Peter M, MD;  Location: [place] [facility];  Service: Cardiovascular;  Laterality: N/A;  . INTRAOPERATIVE TRANSESOPHAGEAL ECHOCARDIOGRAM N/A 02/03/2018   Procedure: INTRAOPERATIVE TRANSESOPHAGEAL ECHOCARDIOGRAM;  Surgeon: Rexene Alberts, MD;  Location: [facility];  Service: Open Heart Surgery;  Laterality: N/A;  . LEFT HEART CATH AND CORONARY  ANGIOGRAPHY N/A 02/03/2018   Procedure: LEFT HEART CATH AND CORONARY ANGIOGRAPHY;  Surgeon: Martinique, Peter M, MD;  Location: [place] [facility];  Service: Cardiovascular;  Laterality: N/A;  . MEDIASTINOSCOPY N/A 03/11/2014   Procedure: MEDIASTINOSCOPY;  Surgeon: Melrose Nakayama, MD;  Location: [facility];  Service: Thoracic;  Laterality: N/A;  . PLACEMENT OF [place] LEFT VENTRICULAR ASSIST DEVICE  02/03/2018   Procedure: PLACEMENT OF [place] LEFT VENTRICULAR ASSIST DEVICE LD;  Surgeon: Rexene Alberts, MD;  Location: [facility];  Service: Open Heart Surgery;;  . REMOVAL OF IMPELLA LEFT VENTRICULAR ASSIST DEVICE N/A 02/08/2018   Procedure: REMOVAL OF [place] LEFT VENTRICULAR ASSIST DEVICE;  Surgeon: Rexene Alberts, MD;  Location: [facility];  Service: Open Heart Surgery;  Laterality: N/A;  . RIGHT HEART CATH N/A 02/03/2018   Procedure: RIGHT HEART CATH;  Surgeon: Martinique, Peter M, MD;  Location: [place] [facility];  Service: Cardiovascular;  Laterality: N/A;  . RIGHT HEART CATH N/A 05/09/2018   Procedure: RIGHT HEART CATH;  Surgeon: Jolaine Artist, MD;  Location: [place] [facility];  Service: Cardiovascular;  Laterality: N/A;  . TEE WITHOUT CARDIOVERSION N/A 02/08/2018   Procedure: TRANSESOPHAGEAL ECHOCARDIOGRAM (TEE);  Surgeon: Rexene Alberts, MD;  Location: [name];  Service: Open Heart Surgery;  Laterality: N/A;  . TUBAL LIGATION    . VIDEO BRONCHOSCOPY WITH ENDOBRONCHIAL ULTRASOUND N/A 03/11/2014   Procedure: VIDEO BRONCHOSCOPY WITH ENDOBRONCHIAL ULTRASOUND;  Surgeon: Melrose Nakayama, MD;  Location[facility];  Service: Thoracic;  Laterality: N/A;     Home Medications and Allergies    Prior to Admission medications   Medication Sig Start Date End Date Taking? Authorizing Provider  ALPRAZolam (XANAX) 0.25 MG tablet Take 0.25 mg by mouth every 8 (eight) hours as needed. for anxiety 11/10/18   [provider]  aspirin 81 MG EC tablet Take 1 tablet (81 mg total) by mouth daily. 07/03/18   Bensimhon,  Shaune Pascal, MD  atorvastatin (LIPITOR) 80 MG tablet Take 1 tablet (80 mg total) by mouth daily at 6 PM. 07/03/18   Bensimhon, Shaune Pascal, MD  clopidogrel (PLAVIX) 75 MG tablet Take 1 tablet (75 mg total) by mouth daily. 07/03/18   Bensimhon, Shaune Pascal, MD  ipratropium-albuterol (DUONEB) 0.5-2.5 (3) MG/3ML SOLN Take 3 mLs by nebulization 2 (two) times daily. 05/23/18   Bensimhon, Shaune Pascal, MD  ivabradine (CORLANOR) 7.5 MG TABS tablet Take 1.5 tablets (11.25 mg total) by mouth 2 (two) times daily with a meal. 12/05/18   Bensimhon, Shaune Pascal, MD  nitroGLYCERIN (NITROSTAT) 0.4 MG SL tablet Place 1 tablet (0.4 mg total) under the tongue every 5 (five) minutes as needed for chest pain. 05/23/18   Bensimhon, Shaune Pascal, MD  oxyCODONE (OXY IR/ROXICODONE) 5 MG immediate release tablet Take 5 mg by mouth every 6 (six) hours as needed. for pain 10/04/18   [provider]  spironolactone (ALDACTONE) 25 MG tablet Take 1 tablet (25 mg total) by mouth daily. 02/10/19   Bensimhon, Shaune Pascal, MD  torsemide (DEMADEX) 20 MG tablet Take 3 tablets (60 mg total) by mouth daily. Take additional as directed by HF team. 11/26/18  Shirley Friar, PA-C   Allergies Allergies  Allergen Reactions  . Codeine Nausea And Vomiting    Family History    Family History  Problem Relation Age of Onset  . Heart disease Mother   . Hypertension Mother   . Heart attack Mother   . Hypertension Maternal Grandmother   . Cancer Maternal Grandmother   . Diabetes Paternal Grandmother   . Stroke Neg Hx    She indicated that her mother is deceased. She indicated that her father is deceased. She indicated that her maternal grandmother is deceased. She indicated that her maternal grandfather is deceased. She indicated that her paternal grandmother is deceased. She indicated that her paternal grandfather is deceased. She indicated that the status of her neg hx is unknown.   Social History    Social History   Socioeconomic History    . Marital status: Married    Spouse name: Not on file  . Number of children: Not on file  . Years of education: Not on file  . Highest education level: Not on file  Occupational History  . Not on file  Social Needs  . Financial resource strain: Not on file  . Food insecurity:    Worry: Not on file    Inability: Not on file  . Transportation needs:    Medical: Not on file    Non-medical: Not on file  Tobacco Use  . Smoking status: Former Smoker    Packs/day: 1.00    Years: 20.00    Pack years: 20.00    Types: Cigarettes    Last attempt to quit: 03/13/2014    Years since quitting: 4.9  . Smokeless tobacco: Never Used  Substance and Sexual Activity  . Alcohol use: Yes    Alcohol/week: 8.0 standard drinks    Types: 8 Glasses of wine per week  . Drug use: No  . Sexual activity: Never  Lifestyle  . Physical activity:    Days per week: Not on file    Minutes per session: Not on file  . Stress: Not on file  Relationships  . Social connections:    Talks on phone: Not on file    Gets together: Not on file    Attends religious service: Not on file    Active member of club or organization: Not on file    Attends meetings of clubs or organizations: Not on file    Relationship status: Not on file  . Intimate partner violence:    Fear of current or ex partner: Not on file    Emotionally abused: Not on file    Physically abused: Not on file    Forced sexual activity: Not on file  Other Topics Concern  . Not on file  Social History Narrative  . Not on file     Review of Systems    General:  No chills, fever, night sweats or weight changes.  Cardiovascular:  See HPI Dermatological: No rash, lesions/masses Respiratory: +cough she relates to asthma Urologic: No hematuria, dysuria Abdominal:   +N/v. No diarrhea, bright red blood per rectum, melena, or hematemesis Neurologic:  No visual changes or changes in mental status. All other systems reviewed and are otherwise negative  except as noted above.  Physical Exam    Blood pressure (!) 98/55, pulse (!) 59, temperature 98.4 F (36.9 C), temperature source Oral, resp. rate 14, SpO2 99 %.  General: Pleasant, frail appearing woman in no acute distress HEENT: Normal  Lungs:  Resp regular and unlabored, mild basilar course crackles. Heart: regular rhythm, normal S1/S2, no murmurs, no S3/S4, PMI is laterally displaced, JVP is elevated to ear at 45 degrees, radial pulses 1+ bilaterally.  Abdomen: Soft, non-tender, non-distended, BS normal. Extremities: No clubbing, cyanosis or edema. Warm, well perfused. Neurologic: alert, oriented, no gross motor or cranial nerve deficits, no abnormal movements.  Labs    Troponin [facility] of Care Test) Recent Labs    02/12/19 0316  TROPIPOC 0.06   Lab Results  Component Value Date   WBC 11.1 (H) 02/12/2019   HGB 14.2 02/12/2019   HCT 45.9 02/12/2019   MCV 90.4 02/12/2019   PLT 202 02/12/2019    Recent Labs  Lab 02/12/19 0255  NA 140  K 3.4*  CL 102  CO2 25  BUN 22*  CREATININE 1.26*  CALCIUM 9.7  PROT 7.1  BILITOT 0.5  ALKPHOS 84  ALT 15  AST 23  GLUCOSE 127*   Lab Results  Component Value Date   CHOL 279 (H) 01/01/2014   HDL 49 01/01/2014   LDLCALC 208 (H) 01/01/2014   TRIG 109 01/01/2014   Lab Results  Component Value Date   DDIMER 1.07 (H) 02/03/2018     Radiology Studies    Dg Chest 2 View  Result Date: 02/12/2019 CLINICAL DATA:  Chest pain and shortness of breath EXAM: CHEST - 2 VIEW COMPARISON:  11/24/2018, 11/15/2018 FINDINGS: Trace right pleural effusion. Post sternotomy changes. Left-sided pacing device as before. Cardiomegaly. No focal opacity. IMPRESSION: Cardiomegaly with trace right pleural effusion. Electronically Signed   By: Donavan Foil M.D.   On: 02/12/2019 03:09    ECG & Cardiac Imaging    ECG shows NSR, LVH, biatrial enlargement, inferior infarct, poor R wave progression, nonspecific ST-T wave changes - personally  reviewed.  Assessment & Plan    1. Acute NSTEMI / History of NSTEMI/STEMI s/p emergent CABG 02/03/18: similar symptoms of prior MI with acute onset chest pain highly concerning for ACS with 1.5 hr delta troponin elevation 20 times initial value. Some borderline ST-T wave changes, nonspecific. Chest pain is significantly improved though not yet resolved. She does elevated JVP and probably some pulmonary edema, but overall few features of a primary ADHF diagnosis to explain her presentation. Will treat as ACS and plan for LHC. Prior cath 01/2018 showed occluded prox LAD, 70% RI, 50% prox Cx. Subsequent CABG with LIMA-LAD, SVG-OM, SVG-Diag.  - Start heparin ACS protocol - ASA load pta, continue 81mg  daily - Load ticagrelor 180mg  - Continue high-intensity statin - NPO for left heart cath - Echo ordered - Repeat troponin on arrival to floor and every 4 hours - No beta blocker or nitroglycerin due to borderline hypotension and bradycardia  2. Acute on Chronic Systolic HF  Last echo 01/7781 ([facility]) showed LVEF 20%, moderate AI, RVF mildly decreased. S/p Medtronicsingle-chamber ICD. Followed by paramedicine. By history she was compensated with stable weight prior to this acute episode that may be secondary to ACS. JVP is elevated, though BNP is lower than all 2019 values. She is orthopneic and will need some diuresis if she goes for left heart cath.  - Lasix 60mg  IV - No beta blocker as above - No ACEi/ARB due to BP, elevated Cr -Holding spiro to potentially allow for some ntg - Holding Corlanor due to borderline bradycardia.  - Reinforced fluid restriction to < 2 L daily, sodium restriction to less than 2000 mg daily, and the importance of daily weights.  3. History of Paroxysmal AF:  She has history of Afib RVR in the past in the setting of her Lung CA. Previously on Xarelto but stopped her medications. CHA2DS2/VASc is at least 4. Not on anticoagulation with h/o lung cancer. No recent evidence of  AF, though she does report recent palpitations.  Signed, Marykay Lex, MD 02/12/2019, 4:25 AM

## 2019-02-12 NOTE — ED Notes (Signed)
Patient transported to X-ray 

## 2019-02-12 NOTE — Progress Notes (Addendum)
Advanced Heart Failure Rounding Note  PCP-Cardiologist: Glori Bickers, MD   Subjective:    Admitted overnight with CP. She woke up orthopneic with left sided CP radiating down left arm and nausea/vomiting. Tried taking rolaids with no relief. CP has improved from 8/10 to 5/10. Feels similar to previous MIs.   Weight had also gone up 2 lbs overnight. Taking all medications at home. Rings were tight last night. She also recalls palpitations, which lasted 10-15 minutes, with associated dizziness 2-3 days ago.   She was given SL nitro with some improvement. Only given one dose due to soft BP. She was started on heparin drip for ACS and loaded with ticagrelor. Troponin 0.06 -> 1.5 -> 3rd pending. She was also given 60 mg IV lasix this morning. EKGs do not show acute changes.  She continues to have CP 5/10. No radiation down arm.   Objective:   Weight Range: 46.8 kg Body mass index is 20.48 kg/m.   Vital Signs:   Temp:  [97.9 F (36.6 C)-98.4 F (36.9 C)] 97.9 F (36.6 C) (03/04 0642) Pulse Rate:  [59-75] 63 (03/04 0642) Resp:  [10-20] 16 (03/04 0415) BP: (92-113)/(52-64) 103/60 (03/04 0639) SpO2:  [97 %-100 %] 97 % (03/04 0642) Weight:  [45 kg-46.8 kg] 46.8 kg (03/04 0629)    Weight change: Filed Weights   02/12/19 0436 02/12/19 0629  Weight: 45 kg 46.8 kg    Intake/Output:   Intake/Output Summary (Last 24 hours) at 02/12/2019 0735 Last data filed at 02/12/2019 0630 Gross per 24 hour  Intake -  Output 200 ml  Net -200 ml      Physical Exam    General:  Thin. No resp difficulty HEENT: Normal Neck: Supple. JVP to jaw. Carotids 2+ bilat; no bruits. No lymphadenopathy or thyromegaly appreciated. Cor: PMI nondisplaced. Regular rate & rhythm. +s3 Lungs: Clear. On 2 L [place].  Abdomen: Soft, nontender, nondistended. No hepatosplenomegaly. No bruits or masses. Good bowel sounds. Extremities: No cyanosis, clubbing, rash, edema Neuro: Alert & orientedx3, cranial nerves  grossly intact. moves all 4 extremities w/o difficulty. Affect pleasant   Telemetry   NSR 60-70s. Personally reviewed.   EKG    NSR 65 bpm. TWI in aVL and V1. Mild ST elevation in V3 and V4. Looks similar to EKG 11/2018. Personally reviewed.   Labs    CBC Recent Labs    02/12/19 0255  WBC 11.1*  NEUTROABS 9.1*  HGB 14.2  HCT 45.9  MCV 90.4  PLT 161   Basic Metabolic Panel Recent Labs    02/12/19 0255  NA 140  K 3.4*  CL 102  CO2 25  GLUCOSE 127*  BUN 22*  CREATININE 1.26*  CALCIUM 9.7   Liver Function Tests Recent Labs    02/12/19 0255  AST 23  ALT 15  ALKPHOS 84  BILITOT 0.5  PROT 7.1  ALBUMIN 4.1   No results for input(s): LIPASE, AMYLASE in the last 72 hours. Cardiac Enzymes No results for input(s): CKTOTAL, CKMB, CKMBINDEX, TROPONINI in the last 72 hours.  BNP: BNP (last 3 results) Recent Labs    05/05/18 0142 11/24/18 0345 02/12/19 0255  BNP 2,601.1* 2,479.2* 910.2*    ProBNP (last 3 results) No results for input(s): PROBNP in the last 8760 hours.   D-Dimer No results for input(s): DDIMER in the last 72 hours. Hemoglobin A1C No results for input(s): HGBA1C in the last 72 hours. Fasting Lipid Panel No results for input(s): CHOL, HDL, LDLCALC, TRIG,  CHOLHDL, LDLDIRECT in the last 72 hours. Thyroid Function Tests No results for input(s): TSH, T4TOTAL, T3FREE, THYROIDAB in the last 72 hours.  Invalid input(s): FREET3  Other results:   Imaging    Dg Chest 2 View  Result Date: 02/12/2019 CLINICAL DATA:  Chest pain and shortness of breath EXAM: CHEST - 2 VIEW COMPARISON:  11/24/2018, 11/15/2018 FINDINGS: Trace right pleural effusion. Post sternotomy changes. Left-sided pacing device as before. Cardiomegaly. No focal opacity. IMPRESSION: Cardiomegaly with trace right pleural effusion. Electronically Signed   By: Donavan Foil M.D.   On: 02/12/2019 03:09      Medications:     Scheduled Medications: . aspirin EC  81 mg Oral Daily   . atorvastatin  80 mg Oral q1800  . sodium chloride flush  3 mL Intravenous Q12H     Infusions: . sodium chloride    . heparin 550 Units/hr (02/12/19 0456)     PRN Medications:  sodium chloride, acetaminophen, morphine injection, ondansetron (ZOFRAN) IV, sodium chloride flush    Patient Profile   Brittany [name] a 58 y.o.femalewith a history of PAF,preveious smoker quit 4 years ago, hypertension, previous small cell lung cancer treated with chemo, chest XRT andprophylacticbrain radiation in 2015, CAD s/p CABG, chronic systolic CHF s/p St Jude ICD, and recurrent pleural effusions s/p mechanical [facility] 09[facility]09[facility]18.  Admitted after waking up with CP radiating down left arm, orthopnea, and nausea/vomiting. Symptoms similar to previous MI  Assessment/Plan   1. NSTEMI - Hx of NSTEMI/STEMI s/p Emergent CABG 02/03/18: Cath with severe 2v CAD as above with severe LV dysfunction/ICM. s/p Emergent CABG 02/03/18. - Troponin trending up 0.06 -> 1.5 -> 10.56 - Continue to have CP 5/10. Feels similar to previous MI pain.  - Going for LHC this am.   2. A/C systolic HF due to ICM. S/p Medtronic single chamber ICD. -Echo 3/2019EF 25-30% with moderate-severe aortic insufficiency which is likely contributing to CHF.  - Echo 04/2018: EF 20%, mild to mod AI, mild MR -Echo 09/04/18 ([facility]) LVEF 20%, Moderate AI, Mild MR, Mild TR, Severe LAE, RV mildly decreased.  - Volume elevated on exam.  - Received 60 mg IV lasix this morning. Will need more post cath.  Arlyce Harman held with soft BP to allow for diuresis - Corlanor held with HR 60s - She is followed by HF paramedicine in the community. - Repeat echo.  3. Recurrent pleural effusions - Lungs clear on exam - Trace pleural effusion on right on CXR this am.   4. Chronic hypoxic respiratory failure - Wears O2 PRN at home  5. PAF - CHA2DS2/VASc is at least 4. (CHF, Vasc disease, HTN, Female).  - She has history of  Afib RVR in the past in the setting of her Lung CA. Previously on Xarelto but stopped.  - Continue ASA and Plavix.  - EKG and tele show NSR, but she had some palpitations a few days ago. Will plan to get ICD interrogated after cath. She may have to come off corlanor.   6. Hypokalemia - K 3.4. Has been supped.   Medication concerns reviewed with patient and pharmacy team. Barriers identified: none  Length of Stay: Rolfe, NP  02/12/2019, 7:35 AM  Advanced Heart Failure Team Pager [phone] (M-F; 7a - 4p)  Please contact [facility] for night-coverage after hours (4p -7a ) and weekends on [email or web address]  Patient seen and examined with the above-signed Advanced Practice Provider and/or Housestaff. I personally reviewed laboratory  data, imaging studies and relevant notes. I independently examined the patient and formulated the important aspects of the plan. I have edited the note to reflect any of my changes or salient points. I have personally discussed the plan with the patient and/or family.  She was admitted with NSTEMI. Cath films reviewed. LIMA is occluded. New 99% lesion in ostial ramus which was stented by Dr. Fletcher Anon. Volume status elevated as well. Will diurese. Watch renal function post cath. Pleural effusion has not recurred. Supp K.   Glori Bickers, MD  1:57 PM

## 2019-02-12 NOTE — Interval H&P Note (Signed)
Cath Lab Visit (complete for each Cath Lab visit)  Clinical Evaluation Leading to the Procedure:   ACS: Yes.    Non-ACS:  n/a    History and Physical Interval Note:  02/12/2019 8:48 AM  Brittany [name]  has presented today for surgery, with the diagnosis of cp  The various methods of treatment have been discussed with the patient and family. After consideration of risks, benefits and other options for treatment, the patient has consented to  Procedure(s): LEFT HEART CATH AND CORS/GRAFTS ANGIOGRAPHY (N/A) as a surgical intervention .  The patient's history has been reviewed, patient examined, no change in status, stable for surgery.  I have reviewed the patient's chart and labs.  Questions were answered to the patient's satisfaction.     Kathlyn [place]

## 2019-02-12 NOTE — Care Management (Signed)
#    7. INS :  MEDICAID  [place]           EFF-DATE : 12-11-2018           CO-PAY- $ 3.90  BRILINTA 90 MG BID CO-PAY-  $ 3.90  PREFERRED PHARMACY :YES [facility]

## 2019-02-13 LAB — BASIC METABOLIC PANEL
Anion gap: 10 (ref 5–15)
BUN: 21 mg/dL — ABNORMAL HIGH (ref 6–20)
CO2: 25 mmol/L (ref 22–32)
Calcium: 9.7 mg/dL (ref 8.9–10.3)
Chloride: 102 mmol/L (ref 98–111)
Creatinine, Ser: 1.02 mg/dL — ABNORMAL HIGH (ref 0.44–1.00)
GFR calc Af Amer: >60 mL/min (ref >60)
GFR calc non Af Amer: >60 mL/min (ref >60)
Glucose, Bld: 128 mg/dL — ABNORMAL HIGH (ref 70–99)
Potassium: 3.8 mmol/L (ref 3.5–5.1)
Sodium: 137 mmol/L (ref 135–145)

## 2019-02-13 LAB — CBC
HEMATOCRIT: 46.8 % — AB (ref 36.0–46.0)
Hemoglobin: 14.4 g/dL (ref 12.0–15.0)
MCH: 27.8 pg (ref 26.0–34.0)
MCHC: 30.8 g/dL (ref 30.0–36.0)
MCV: 90.3 fL (ref 80.0–100.0)
NRBC: 0 % (ref 0.0–0.2)
Platelets: 215 10*3/uL (ref 150–400)
RBC: 5.18 MIL/uL — ABNORMAL HIGH (ref 3.87–5.11)
RDW: 21.2 % — AB (ref 11.5–15.5)
WBC: 6.8 10*3/uL (ref 4.0–10.5)

## 2019-02-13 MED ORDER — SPIRONOLACTONE 25 MG PO TABS
25.0000 mg | ORAL_TABLET | Freq: Every day | ORAL | Status: DC
  Administered 2019-02-13: 25 mg via ORAL
  Filled 2019-02-13: qty 1

## 2019-02-13 MED ORDER — POTASSIUM CHLORIDE CRYS ER 10 MEQ PO TBCR
20.0000 meq | EXTENDED_RELEASE_TABLET | Freq: Once | ORAL | Status: AC
  Administered 2019-02-13: 12:00:00 20 meq via ORAL
  Filled 2019-02-13: qty 2

## 2019-02-13 MED ORDER — FUROSEMIDE 10 MG/ML IJ SOLN: 60.0000 mg | Freq: Two times a day (BID) | INTRAMUSCULAR | Status: DC

## 2019-02-13 MED ORDER — IVABRADINE HCL 7.5 MG PO TABS
7.5000 mg | ORAL_TABLET | Freq: Two times a day (BID) | ORAL | Status: DC
  Administered 2019-02-13 – 2019-02-14 (×3): 7.5 mg via ORAL
  Filled 2019-02-13 (×5): qty 1

## 2019-02-13 MED ORDER — SPIRONOLACTONE 25 MG PO TABS
25.0000 mg | ORAL_TABLET | Freq: Every day | ORAL | Status: DC
  Filled 2019-02-13: qty 1

## 2019-02-13 MED ORDER — POTASSIUM CHLORIDE CRYS ER 20 MEQ PO TBCR
40.0000 meq | EXTENDED_RELEASE_TABLET | Freq: Once | ORAL | Status: AC
  Administered 2019-02-13: 12:00:00 20 meq via ORAL
  Filled 2019-02-13: qty 2

## 2019-02-13 MED ORDER — FUROSEMIDE 10 MG/ML IJ SOLN
60.0000 mg | Freq: Two times a day (BID) | INTRAMUSCULAR | Status: DC
  Administered 2019-02-13 (×2): 60 mg via INTRAVENOUS
  Filled 2019-02-13 (×2): qty 6

## 2019-02-13 MED FILL — Verapamil HCl IV Soln 2.5 MG/ML: INTRAVENOUS | Qty: 2 | Status: AC

## 2019-02-13 NOTE — Discharge Summary (Addendum)
Advanced Heart Failure Discharge Note  Discharge Summary   Patient ID: Brittany [name] MRN: [id number], DOB/AGE: February 18, 1961 58 y.o. Admit date: 02/12/2019 D/C date:     02/14/2019   Primary Discharge Diagnoses:  1. NSTEMI in setting of CAD s/p CABG 2. A/C systolic HF due to ICM - Had MDT single chamber ICD 3. Recurrent pleural effusions s/p pleurodesis 4. Chronic hypoxic respiratory failure 5. PAF 6. Hypokalemia  Hospital Course:  Brittany [name] a 58 y.o.femalewith a history of PAF,preveious smoker quit 4 years ago, hypertension, previous small cell lung cancer treated with chemo, chest XRT andprophylacticbrain radiation in 2015, CAD s/p CABG, chronic systolic CHF s/p St Jude ICD, and recurrent pleural effusions s/p mechanical pleurodesis at [facility] 09/09/18.  Admitted 02/12/19 after waking up with CP, SOB, nausea, and vomiting. EKG unchanged, but troponin trended up. She was taken for urgent cath for NSTEMI. (see results below) She received DES to ostial ramus into distal left main and will require DAPT with ASA and brilinta for at least 1 year.  She was also volume overloaded. Diuresed with IV lasix, then transitioned to torsemide 60 mg daily for home. Repeat echo completed and showed EF 25-30% (20% previously) with RV mildly reduced. She did not have recurrent pleural effusions. HF medications limited with soft BP. Electrolytes monitored and supplemented as needed. Renal function remained stable post-cath.  She reported some palpitations prior to admission. ICD was interrogated and showed no recent afib, so her corlanor was resumed.  She will be followed closely in HF clinic, with appointment as below. Will continue HF paramedicine. She was also referred to cardiac rehab.   Discharge Weight Range: 101 lbs Discharge Vitals: Blood pressure 104/68, pulse 88, temperature 97.8 F (36.6 C), temperature source Oral, resp. rate 16, height 4' 11.5" (1.511 m), weight 46.2 kg,  SpO2 100 %.  Labs: Lab Results  Component Value Date   WBC 6.8 02/13/2019   HGB 14.4 02/13/2019   HCT 46.8 (H) 02/13/2019   MCV 90.3 02/13/2019   PLT 215 02/13/2019    Recent Labs  Lab 02/12/19 0255  02/14/19 0304  NA 140   < > 136  K 3.4*   < > 4.1  CL 102   < > 101  CO2 25   < > 25  BUN 22*   < > 30*  CREATININE 1.26*   < > 1.10*  CALCIUM 9.7   < > 10.2  PROT 7.1  --   --   BILITOT 0.5  --   --   ALKPHOS 84  --   --   ALT 15  --   --   AST 23  --   --   GLUCOSE 127*   < > 129*   < > = values in this interval not displayed.   Lab Results  Component Value Date   CHOL 279 (H) 01/01/2014   HDL 49 01/01/2014   LDLCALC 208 (H) 01/01/2014   TRIG 109 01/01/2014   BNP (last 3 results) Recent Labs    05/05/18 0142 11/24/18 0345 02/12/19 0255  BNP 2,601.1* 2,479.2* 910.2*    ProBNP (last 3 results) No results for input(s): PROBNP in the last 8760 hours.   Diagnostic Studies/Procedures   Echo 02/12/19: EF 25-30%, RV mildly reduced  LHC 02/12/19:  Ost LAD to Prox LAD lesion is 100% stenosed.  Ost Cx to Prox Cx lesion is 100% stenosed.  Ost Ramus lesion is 99% stenosed.  Post intervention, there is a 0%  residual stenosis.  A drug-eluting stent was successfully placed using a [place] 2.25X15.  SVG and is normal in caliber.  The graft exhibits no disease.  Ost 1st Diag lesion is 70% stenosed.  SVG and is normal in caliber.  Prox Graft lesion is 100% stenosed. 1. Significant underlying two-vessel coronary artery disease with patent SVG to OM and SVG to diagonal which supplies the LAD territory. Atretic LIMA to LAD given competitive flow from SVG to diagonal. Severe ostial ramus artery stenosis not supplied by bypass with his area suggestive of plaque rupture. 2. Severely elevated left ventricular end-diastolic pressure 34 mmHg. Left ventricular angiography was not performed. 3. Successful angioplasty and drug-eluting stent placement to the  ostial ramus extending into the distal left main.  Discharge Medications   Allergies as of 02/14/2019      Reactions   Codeine Nausea And Vomiting      Medication List    TAKE these medications   albuterol 108 (90 Base) MCG/ACT inhaler Commonly known as:  PROVENTIL HFA;VENTOLIN HFA Inhale 2 puffs into the lungs every 6 (six) hours as needed for wheezing or shortness of breath.   ALPRAZolam 0.25 MG tablet Commonly known as:  XANAX Take 0.25 mg by mouth every 8 (eight) hours as needed for anxiety.   aspirin 81 MG EC tablet Take 1 tablet (81 mg total) by mouth daily.   atorvastatin 80 MG tablet Commonly known as:  LIPITOR Take 1 tablet (80 mg total) by mouth daily at 6 PM.   ipratropium-albuterol 0.5-2.5 (3) MG/3ML Soln Commonly known as:  DUONEB Take 3 mLs by nebulization 2 (two) times daily. What changed:    when to take this  reasons to take this   ivabradine 7.5 MG Tabs tablet Commonly known as:  CORLANOR Take 1.5 tablets (11.25 mg total) by mouth 2 (two) times daily with a meal.   nitroGLYCERIN 0.4 MG SL tablet Commonly known as:  NITROSTAT Place 1 tablet (0.4 mg total) under the tongue every 5 (five) minutes as needed for chest pain.   oxyCODONE 5 MG immediate release tablet Commonly known as:  Oxy IR/ROXICODONE Take 5 mg by mouth every 6 (six) hours as needed for moderate pain.   spironolactone 25 MG tablet Commonly known as:  ALDACTONE Take 1 tablet (25 mg total) by mouth daily.   ticagrelor 90 MG Tabs tablet Commonly known as:  BRILINTA Take 1 tablet (90 mg total) by mouth 2 (two) times daily.   torsemide 20 MG tablet Commonly known as:  DEMADEX Take 3 tablets (60 mg total) by mouth daily. Take additional as directed by HF team.       Disposition   The patient will be discharged in stable condition to home. Discharge Instructions    (HEART FAILURE PATIENTS) Call MD:  Anytime you have any of the following symptoms: 1) 3 pound weight gain in 24  hours or 5 pounds in 1 week 2) shortness of breath, with or without a dry hacking cough 3) swelling in the hands, feet or stomach 4) if you have to sleep on extra pillows at night in order to breathe.   Complete by:  As directed    Amb Referral to Cardiac Rehabilitation   Complete by:  As directed    Diagnosis:   Coronary Stents NSTEMI PTCA     Diet - low sodium heart healthy   Complete by:  As directed    Increase activity slowly   Complete by:  As  directed    STOP any activity that causes chest pain, shortness of breath, dizziness, sweating, or exessive weakness   Complete by:  As directed      Follow-up Information    [facility] Follow up on 02/24/2019.   Specialty:  Cardiology Why:  Heart Failure F/U 02/24/19 @ 130  -Parking in ER lot (enter under blue awning to left of ER), or underneath [place] in the [place] on [address] (garage code:[id number]   , elevator to 1st floor).  -Take all am meds and bring all med bottles Contact information: [address] [phone] [place] [phone]            Duration of Discharge Encounter: Greater than 35 minutes   Signed, Annamaria Helling 02/14/2019, 8:44 AM   Patient seen and examined with the above-signed Advanced Practice Provider and/or Housestaff. I personally reviewed laboratory data, imaging studies and relevant notes. I independently examined the patient and formulated the important aspects of the plan. I have edited the note to reflect any of my changes or salient points. I have personally discussed the plan with the patient and/or family.  She is much improved after PCI and diuresis. Meds adjusted. [place] for d/c today with close f/u in HF Clinic.   Glori Bickers, MD  5:37 PM

## 2019-02-13 NOTE — Progress Notes (Signed)
CARDIAC REHAB PHASE I   PRE:  Rate/Rhythm: 100 ST    BP: sitting 107/63    SaO2:   MODE:  Ambulation: 460 ft   POST:  Rate/Rhythm: 111 ST    BP: sitting 101/77     SaO2:   Tolerated well, no c/o. Long discussion of MI, HF, diet, ex, Brilinta, NTG, and CRPII. Good reception. Sts she is seeing a counselor to help her process all she has been through. Will refer to [place], ACSM 02/13/2019 9:34 AM

## 2019-02-13 NOTE — Progress Notes (Addendum)
Advanced Heart Failure Rounding Note  PCP-Cardiologist: Glori Bickers, MD   Subjective:    Underwent LHC yesterday with DES to ostial ramus into distal left pain.   She received 60 mg IV lasix x1 pre-cath. I/O negative 800 mls. Weight is down 2 lbs. Creatinine stable 1.02 today. SBP ~100.   Denies CP or SOB. Feels much better. Wants to go home.   Echo 02/12/19: EF 25-30%, RV mildly reduced  LHC 02/12/19:  Ost LAD to Prox LAD lesion is 100% stenosed.  Ost Cx to Prox Cx lesion is 100% stenosed.  Ost Ramus lesion is 99% stenosed.  Post intervention, there is a 0% residual stenosis.  A drug-eluting stent was successfully placed using a [place] 2.25X15.  SVG and is normal in caliber.  The graft exhibits no disease.  Ost 1st Diag lesion is 70% stenosed.  SVG and is normal in caliber.  Prox Graft lesion is 100% stenosed. 1.  Significant underlying two-vessel coronary artery disease with patent SVG to OM and SVG to diagonal which supplies the LAD territory.  Atretic LIMA to LAD given competitive flow from SVG to diagonal.  Severe ostial ramus artery stenosis not supplied by bypass with his area suggestive of plaque rupture. 2.  Severely elevated left ventricular end-diastolic pressure 34 mmHg.  Left ventricular angiography was not performed. 3.  Successful angioplasty and drug-eluting stent placement to the ostial ramus extending into the distal left main.  Objective:   Weight Range: 46 kg Body mass index is 20.14 kg/m.   Vital Signs:   Temp:  [98.1 F (36.7 C)-98.6 F (37 C)] 98.2 F (36.8 C) (03/05 0755) Pulse Rate:  [64-89] 83 (03/05 0755) Resp:  [11-27] 23 (03/05 0755) BP: (97-119)/(59-75) 107/63 (03/05 0755) SpO2:  [98 %-100 %] 98 % (03/05 0800) Weight:  [46 kg] 46 kg (03/05 0450) Last BM Date: 02/12/19  Weight change: Filed Weights   02/12/19 0436 02/12/19 0629 02/13/19 0450  Weight: 45 kg 46.8 kg 46 kg    Intake/Output:   Intake/Output  Summary (Last 24 hours) at 02/13/2019 0952 Last data filed at 02/12/2019 1949 Gross per 24 hour  Intake 960 ml  Output 1451 ml  Net -491 ml      Physical Exam    General: Thin. No resp difficulty. HEENT: Normal Neck: Supple. JVP to jaw. Carotids 2+ bilat; no bruits. No thyromegaly or nodule noted. Cor: PMI nondisplaced. RRR, +s3 Lungs: CTAB, normal effort. Abdomen: Soft, non-tender, non-distended, no HSM. No bruits or masses. +BS  Extremities: No cyanosis, clubbing, or rash. R and LLE no edema. Right groin dressing CDI.  Neuro: Alert & orientedx3, cranial nerves grossly intact. moves all 4 extremities w/o difficulty. Affect pleasant  Telemetry   NSR 80s. Personally reviewed.   EKG    NSR 81 bpm with LAFB. Personally reviewed.   Labs    CBC Recent Labs    02/12/19 0255 02/13/19 0340  WBC 11.1* 6.8  NEUTROABS 9.1*  --   HGB 14.2 14.4  HCT 45.9 46.8*  MCV 90.4 90.3  PLT 202 409   Basic Metabolic Panel Recent Labs    02/12/19 0255 02/12/19 0713 02/12/19 1410 02/13/19 0340  NA 140  --   --  137  K 3.4*  --   --  3.8  CL 102  --   --  102  CO2 25  --   --  25  GLUCOSE 127*  --   --  128*  BUN  22*  --   --  21*  CREATININE 1.26*  --  1.28* 1.02*  CALCIUM 9.7  --   --  9.7  MG  --  2.0  --   --    Liver Function Tests Recent Labs    02/12/19 0255  AST 23  ALT 15  ALKPHOS 84  BILITOT 0.5  PROT 7.1  ALBUMIN 4.1   No results for input(s): LIPASE, AMYLASE in the last 72 hours. Cardiac Enzymes Recent Labs    02/12/19 0652 02/12/19 1040 02/12/19 1410  TROPONINI 6.05* 10.56* 10.61*    BNP: BNP (last 3 results) Recent Labs    05/05/18 0142 11/24/18 0345 02/12/19 0255  BNP 2,601.1* 2,479.2* 910.2*    ProBNP (last 3 results) No results for input(s): PROBNP in the last 8760 hours.   D-Dimer No results for input(s): DDIMER in the last 72 hours. Hemoglobin A1C No results for input(s): HGBA1C in the last 72 hours. Fasting Lipid Panel No  results for input(s): CHOL, HDL, LDLCALC, TRIG, CHOLHDL, LDLDIRECT in the last 72 hours. Thyroid Function Tests No results for input(s): TSH, T4TOTAL, T3FREE, THYROIDAB in the last 72 hours.  Invalid input(s): FREET3  Other results:   Imaging    No results found.   Medications:     Scheduled Medications: . aspirin EC  81 mg Oral Daily  . atorvastatin  80 mg Oral q1800  . enoxaparin (LOVENOX) injection  40 mg Subcutaneous Q24H  . sodium chloride flush  3 mL Intravenous Q12H  . ticagrelor  90 mg Oral BID    Infusions: . sodium chloride      PRN Medications: sodium chloride, acetaminophen, morphine injection, ondansetron (ZOFRAN) IV, sodium chloride flush    Patient Profile   Abrar Koone a 58 y.o.femalewith a history of PAF,preveious smoker quit 4 years ago, hypertension, previous small cell lung cancer treated with chemo, chest XRT andprophylacticbrain radiation in 2015, CAD s/p CABG, chronic systolic CHF s/p St Jude ICD, and recurrent pleural effusions s/p mechanical pleurodesis at [facility] 09/09/18.  Admitted after waking up with CP radiating down left arm, orthopnea, and nausea/vomiting. Symptoms similar to previous MI  Assessment/Plan   1. NSTEMI - Hx of NSTEMI/STEMI s/p Emergent CABG 02/03/18: Cath with severe 2v CAD as above with severe LV dysfunction/ICM. s/p Emergent CABG 02/03/18. - Troponin trending up 0.06 -> 1.5 -> 10.56 - S/p LHC 02/12/19 with DES to the ostial ramus extending into the distal left main - She will need DAPT for at least 1 year. On ASA and Brilinta. Price for Brilinta is $3.90 - No further CP.   2. A/C systolic HF due to ICM. S/p Medtronic single chamber ICD. -Echo 3/2019EF 25-30% with moderate-severe aortic insufficiency which is likely contributing to CHF.  - Echo 04/2018: EF 20%, mild to mod AI, mild MR -Echo 09/04/18 ([facility]) LVEF 20%, Moderate AI, Mild MR, Mild TR, Severe LAE, RV mildly decreased.  - Echo 02/12/19: EF  25-30%, RV mildly reduced - Volume elevated on exam. LVEDP 34 mmHg on cath yesterday. Creatinine stable post cath. - Start 60 mg IV lasix BID. Cautiously with soft SBP ~100. - Resume spiro 25 mg qHS - Resume corlanor 7.5 mg BID - She is followed by HF paramedicine in the community.  3. Recurrent pleural effusions - Trace pleural effusion on right on CXR on admit.  - Lungs clear on exam.   4. Chronic hypoxic respiratory failure - Wears O2 PRN at home. Stable on RA today.  5. PAF - CHA2DS2/VASc is at least 4. (CHF, Vasc disease, HTN, Female).  - She has history of Afib RVR in the past in the setting of her Lung CA. Previously on Xarelto but stopped.  - Continue ASA and brilinta.  - Had palpitations earlier in the week. ICD interrogated yesterday and showed no afib. Okay to resume corlanor.  6. Hypokalemia - K 3.8 today. Give extra with IV lasix. Will also resume spiro as above.   Medication concerns reviewed with patient and pharmacy team. Barriers identified: none  Length of Stay: [place], NP  02/13/2019, 9:52 AM  Advanced Heart Failure Team Pager [phone] (M-F; 7a - 4p)  Please contact [facility] for night-coverage after hours (4p -7a ) and weekends on [email or web address]  Patient seen and examined with the above-signed Advanced Practice Provider and/or Housestaff. I personally reviewed laboratory data, imaging studies and relevant notes. I independently examined the patient and formulated the important aspects of the plan. I have edited the note to reflect any of my changes or salient points. I have personally discussed the plan with the patient and/or family.  She is doing well post PCI for NSTEMI. LVEDP at cath was 34. Volume status now improving with IV diuresis. Will need at least 2 doses today. Supp K. BP too low to titrate HF meds. Continue DAPT and statin. CR to see.   Glori Bickers, MD  6:41 PM

## 2019-02-14 LAB — BASIC METABOLIC PANEL
Anion gap: 10 (ref 5–15)
BUN: 30 mg/dL — ABNORMAL HIGH (ref 6–20)
CO2: 25 mmol/L (ref 22–32)
Calcium: 10.2 mg/dL (ref 8.9–10.3)
Chloride: 101 mmol/L (ref 98–111)
Creatinine, Ser: 1.1 mg/dL — ABNORMAL HIGH (ref 0.44–1.00)
GFR calc Af Amer: >60 mL/min (ref >60)
GFR calc non Af Amer: 55 mL/min — ABNORMAL LOW (ref >60)
Glucose, Bld: 129 mg/dL — ABNORMAL HIGH (ref 70–99)
Potassium: 4.1 mmol/L (ref 3.5–5.1)
Sodium: 136 mmol/L (ref 135–145)

## 2019-02-14 MED ORDER — ALBUTEROL SULFATE HFA 108 (90 BASE) MCG/ACT IN AERS: 2.0000 | INHALATION_SPRAY | Freq: Four times a day (QID) | RESPIRATORY_TRACT | 0 refills | Status: DC | PRN

## 2019-02-14 MED ORDER — TICAGRELOR 90 MG PO TABS: 90.0000 mg | ORAL_TABLET | Freq: Two times a day (BID) | ORAL | 5 refills | Status: DC

## 2019-02-14 MED ORDER — NITROGLYCERIN 0.4 MG SL SUBL: 0.4000 mg | SUBLINGUAL_TABLET | SUBLINGUAL | 5 refills | Status: DC | PRN

## 2019-02-14 NOTE — Progress Notes (Signed)
CARDIAC REHAB PHASE I   PRE:  Rate/Rhythm: 79 SR    BP: sitting 104/68    SaO2:   MODE:  Ambulation: 1000 ft   POST:  Rate/Rhythm: 95 SR    BP: sitting 119/72     SaO2:   Tolerated well. No c/o. Steady pace, HR lower today. Not SOB. All questions answered. [place], ACSM 02/14/2019 9:33 AM

## 2019-02-14 NOTE — Progress Notes (Signed)
Advanced Heart Failure Rounding Note  PCP-Cardiologist: Glori Bickers, MD   Subjective:    Underwent LHC 3/4 with DES to ostial ramus into distal left main. LVEDP 34 post cath.   Diuresed well. Denies further CP or sob. Walking halls. Anxious to go home.   Echo 02/12/19: EF 25-30%, RV mildly reduced  LHC 02/12/19:  Ost LAD to Prox LAD lesion is 100% stenosed.  Ost Cx to Prox Cx lesion is 100% stenosed.  Ost Ramus lesion is 99% stenosed.  Post intervention, there is a 0% residual stenosis.  A drug-eluting stent was successfully placed using a [place] 2.25X15.  SVG and is normal in caliber.  The graft exhibits no disease.  Ost 1st Diag lesion is 70% stenosed.  SVG and is normal in caliber.  Prox Graft lesion is 100% stenosed. 1.  Significant underlying two-vessel coronary artery disease with patent SVG to OM and SVG to diagonal which supplies the LAD territory.  Atretic LIMA to LAD given competitive flow from SVG to diagonal.  Severe ostial ramus artery stenosis not supplied by bypass with his area suggestive of plaque rupture. 2.  Severely elevated left ventricular end-diastolic pressure 34 mmHg.  Left ventricular angiography was not performed. 3.  Successful angioplasty and drug-eluting stent placement to the ostial ramus extending into the distal left main.  Objective:   Weight Range: 46.2 kg Body mass index is 20.24 kg/m.   Vital Signs:   Temp:  [97.9 F (36.6 C)-98.6 F (37 C)] 98.4 F (36.9 C) (03/06 0258) Pulse Rate:  [72-100] 72 (03/06 0258) Resp:  [13-23] 20 (03/06 0258) BP: (94-127)/(60-72) 106/70 (03/06 0258) SpO2:  [96 %-98 %] 98 % (03/06 0258) Weight:  [46.2 kg] 46.2 kg (03/06 0258) Last BM Date: 02/12/19  Weight change: Filed Weights   02/12/19 0629 02/13/19 0450 02/14/19 0258  Weight: 46.8 kg 46 kg 46.2 kg    Intake/Output:   Intake/Output Summary (Last 24 hours) at 02/14/2019 0459 Last data filed at 02/14/2019 0250 Gross per  24 hour  Intake 840 ml  Output 1950 ml  Net -1110 ml      Physical Exam    General:  Thin g. No resp difficulty HEENT: norl Neck: supple. no JVD. Carotids 2+ bilat; no bruits. No lymphadenopathy or thryomegaly appreciated. Cor: PMI nondisplaced. Regular rate & rhythm. No rubs, gallops or murmurs. Lungs: clear Abdomen: soft, nontender, nondistended. No hepatosplenomegaly. No bruits or masses. Good bowel sounds. Extremities: no cyanosis, clubbing, rash, edema Neuro: alert & orientedx3, cranial nerves grossly intact. moves all 4 extremities w/o difficulty. Affect pleasant   Telemetry   NSR 70-80s. Personally reviewed.   EKG    NSR 81 bpm with LAFB. Personally reviewed.   Labs    CBC Recent Labs    02/12/19 0255 02/13/19 0340  WBC 11.1* 6.8  NEUTROABS 9.1*  --   HGB 14.2 14.4  HCT 45.9 46.8*  MCV 90.4 90.3  PLT 202 161   Basic Metabolic Panel Recent Labs    02/12/19 0713  02/13/19 0340 02/14/19 0304  NA  --   --  137 136  K  --   --  3.8 4.1  CL  --   --  102 101  CO2  --   --  25 25  GLUCOSE  --   --  128* 129*  BUN  --   --  21* 30*  CREATININE  --    < > 1.02* 1.10*  CALCIUM  --   --  9.7 10.2  MG 2.0  --   --   --    < > = values in this interval not displayed.   Liver Function Tests Recent Labs    02/12/19 0255  AST 23  ALT 15  ALKPHOS 84  BILITOT 0.5  PROT 7.1  ALBUMIN 4.1   No results for input(s): LIPASE, AMYLASE in the last 72 hours. Cardiac Enzymes Recent Labs    02/12/19 0652 02/12/19 1040 02/12/19 1410  TROPONINI 6.05* 10.56* 10.61*    BNP: BNP (last 3 results) Recent Labs    05/05/18 0142 11/24/18 0345 02/12/19 0255  BNP 2,601.1* 2,479.2* 910.2*    ProBNP (last 3 results) No results for input(s): PROBNP in the last 8760 hours.   D-Dimer No results for input(s): DDIMER in the last 72 hours. Hemoglobin A1C No results for input(s): HGBA1C in the last 72 hours. Fasting Lipid Panel No results for input(s): CHOL,  HDL, LDLCALC, TRIG, CHOLHDL, LDLDIRECT in the last 72 hours. Thyroid Function Tests No results for input(s): TSH, T4TOTAL, T3FREE, THYROIDAB in the last 72 hours.  Invalid input(s): FREET3  Other results:   Imaging    No results found.   Medications:     Scheduled Medications: . aspirin EC  81 mg Oral Daily  . atorvastatin  80 mg Oral q1800  . enoxaparin (LOVENOX) injection  40 mg Subcutaneous Q24H  . furosemide  60 mg Intravenous BID  . ivabradine  7.5 mg Oral BID WC  . sodium chloride flush  3 mL Intravenous Q12H  . spironolactone  25 mg Oral QHS  . ticagrelor  90 mg Oral BID    Infusions: . sodium chloride      PRN Medications: sodium chloride, acetaminophen, morphine injection, ondansetron (ZOFRAN) IV, sodium chloride flush    Patient Profile   Brittany [name] a 58 y.o.femalewith a history of PAF,preveious smoker quit 4 years ago, hypertension, previous small cell lung cancer treated with chemo, chest XRT andprophylacticbrain radiation in 2015, CAD s/p CABG, chronic systolic CHF s/p St Jude ICD, and recurrent pleural effusions s/p mechanical pleurodesis at [facility] 09/09/18.  Admitted after waking up with CP radiating down left arm, orthopnea, and nausea/vomiting. Symptoms similar to previous MI  Assessment/Plan   1. NSTEMI - Hx of NSTEMI/STEMI s/p Emergent CABG 02/03/18: Cath with severe 2v CAD as above with severe LV dysfunction/ICM. s/p Emergent CABG 02/03/18. - Troponin peak 10.56 - S/p LHC 02/12/19 with DES to the ostial ramus extending into the distal left main - She will need DAPT for at least 1 year. On ASA and Brilinta. Price for Brilinta is $3.90 - No further CP.  - Enroll CR  2. A/C systolic HF due to ICM. S/p Medtronic single chamber ICD. -Echo 3/2019EF 25-30% with moderate-severe aortic insufficiency which is likely contributing to CHF.  - Echo 04/2018: EF 20%, mild to mod AI, mild MR -Echo 09/04/18 ([facility]) LVEF 20%, Moderate AI,  Mild MR, Mild TR, Severe LAE, RV mildly decreased.  - Echo 02/12/19: EF 25-30%, RV mildly reduced - Volume elevated on cath with LVEDP 34 - Now diuresed and volume status looks good  - Resume spiro 25 mg qHS - Resume corlanor 7.5 mg BID - She is followed by HF paramedicine in the community.  3. Recurrent pleural effusions - Trace pleural effusion on right on CXR on admit.   4. Chronic hypoxic respiratory failure - Wears O2 PRN at home. Stable on RA today.   5. PAF - CHA2DS2/VASc is at  least 4. (CHF, Vasc disease, HTN, Female).  - She has history of Afib RVR in the past in the setting of her Lung CA. Previously on Xarelto but stopped.  - Continue ASA and brilinta.  - Had palpitations earlier in the week. ICD interrogated yesterday and showed no afib. Okay to resume corlanor.  6. Hypokalemia - K 4.1 today.    Home today.   Length of Stay: 2  Glori Bickers, MD  02/14/2019, 4:59 AM  Advanced Heart Failure Team Pager [phone] (M-F; [place])  Please contact [facility] for night-coverage after hours (4p -7a ) and weekends on [email or web address]

## 2019-02-21 ENCOUNTER — Telehealth (HOSPITAL_COMMUNITY): Payer: Self-pay

## 2019-02-21 NOTE — Telephone Encounter (Signed)
I called pt to schedule a CHP visit.  She agrees on Wednesday.  Pt stated that she's running out of the corlanor, so she's going to take 1 a day until Monday.

## 2019-02-21 NOTE — Telephone Encounter (Signed)
Pt insurance is active and benefits verified through [employer] Co-pay 0, DED $500/$500 met, out of pocket $2,600/$902.39 met, co-insurance 20%. no pre-authorization required. Passport, 02/21/2019 @ 2:41pm, REF# [id number] Pt insurance is active through Medicaid. Ref# [id number]  Will fax over [facility] Reimbursement form to Dr. [name]  Will contact pt to see if she is interested in the Cardiac Rehab program. If interested, pt will need to complete f/u appt on. Once completed, pt will be contacted for scheduling upon review by the RN Navigator.  Will contact patient to see if she is interested in the Cardiac Rehab Program. If interested, patient will need to complete follow up appt. Once completed, patient will be contacted for scheduling upon review by the RN Navigator. Brittany [name]. Support Rep II

## 2019-02-21 NOTE — Telephone Encounter (Signed)
Called patient to see if she is interested in the Cardiac Rehab Program. Patient expressed interest. Explained scheduling process and went over insurance, patient verbalized understanding. Will contact patient for scheduling once f/u has been completed. Tedra [place]. Support Rep II

## 2019-02-24 ENCOUNTER — Other Ambulatory Visit (HOSPITAL_COMMUNITY): Payer: Self-pay | Admitting: *Deleted

## 2019-02-24 ENCOUNTER — Encounter (HOSPITAL_COMMUNITY): Payer: Self-pay

## 2019-02-24 ENCOUNTER — Other Ambulatory Visit: Payer: Self-pay

## 2019-02-24 ENCOUNTER — Ambulatory Visit (HOSPITAL_COMMUNITY)
Admit: 2019-02-24 | Discharge: 2019-02-24 | Disposition: A | Discharge status: Home / Self Care | Payer: BLUE CROSS/BLUE SHIELD | Source: Ambulatory Visit | Attending: Cardiology | Admitting: Cardiology

## 2019-02-24 VITALS — BP 112/78 | HR 81 | Wt 104.6 lb

## 2019-02-24 DIAGNOSIS — Z8709 Personal history of other diseases of the respiratory system: Secondary | ICD-10-CM

## 2019-02-24 DIAGNOSIS — J9611 Chronic respiratory failure with hypoxia: Secondary | ICD-10-CM | POA: Insufficient documentation

## 2019-02-24 DIAGNOSIS — Z87891 Personal history of nicotine dependence: Secondary | ICD-10-CM | POA: Diagnosis not present

## 2019-02-24 DIAGNOSIS — I509 Heart failure, unspecified: Secondary | ICD-10-CM

## 2019-02-24 DIAGNOSIS — I2510 Atherosclerotic heart disease of native coronary artery without angina pectoris: Secondary | ICD-10-CM | POA: Insufficient documentation

## 2019-02-24 DIAGNOSIS — Z7901 Long term (current) use of anticoagulants: Secondary | ICD-10-CM | POA: Insufficient documentation

## 2019-02-24 DIAGNOSIS — I25810 Atherosclerosis of coronary artery bypass graft(s) without angina pectoris: Secondary | ICD-10-CM

## 2019-02-24 DIAGNOSIS — Z951 Presence of aortocoronary bypass graft: Secondary | ICD-10-CM | POA: Insufficient documentation

## 2019-02-24 DIAGNOSIS — E876 Hypokalemia: Secondary | ICD-10-CM | POA: Insufficient documentation

## 2019-02-24 DIAGNOSIS — I110 Hypertensive heart disease with heart failure: Secondary | ICD-10-CM | POA: Insufficient documentation

## 2019-02-24 DIAGNOSIS — I480 Paroxysmal atrial fibrillation: Secondary | ICD-10-CM | POA: Insufficient documentation

## 2019-02-24 DIAGNOSIS — I5022 Chronic systolic (congestive) heart failure: Secondary | ICD-10-CM

## 2019-02-24 DIAGNOSIS — Z7982 Long term (current) use of aspirin: Secondary | ICD-10-CM | POA: Insufficient documentation

## 2019-02-24 DIAGNOSIS — C3490 Malignant neoplasm of unspecified part of unspecified bronchus or lung: Secondary | ICD-10-CM

## 2019-02-24 DIAGNOSIS — J45909 Unspecified asthma, uncomplicated: Secondary | ICD-10-CM | POA: Diagnosis not present

## 2019-02-24 DIAGNOSIS — I252 Old myocardial infarction: Secondary | ICD-10-CM | POA: Insufficient documentation

## 2019-02-24 DIAGNOSIS — Z95810 Presence of automatic (implantable) cardiac defibrillator: Secondary | ICD-10-CM | POA: Diagnosis not present

## 2019-02-24 DIAGNOSIS — J90 Pleural effusion, not elsewhere classified: Secondary | ICD-10-CM | POA: Diagnosis not present

## 2019-02-24 DIAGNOSIS — F419 Anxiety disorder, unspecified: Secondary | ICD-10-CM | POA: Diagnosis not present

## 2019-02-24 DIAGNOSIS — Z79899 Other long term (current) drug therapy: Secondary | ICD-10-CM | POA: Diagnosis not present

## 2019-02-24 LAB — BASIC METABOLIC PANEL
Anion gap: 11 (ref 5–15)
BUN: 16 mg/dL (ref 6–20)
CALCIUM: 10.1 mg/dL (ref 8.9–10.3)
CO2: 26 mmol/L (ref 22–32)
CREATININE: 1.04 mg/dL — AB (ref 0.44–1.00)
Chloride: 104 mmol/L (ref 98–111)
GFR calc Af Amer: >60 mL/min (ref >60)
GFR calc non Af Amer: 59 mL/min — ABNORMAL LOW (ref >60)
Glucose, Bld: 116 mg/dL — ABNORMAL HIGH (ref 70–99)
Potassium: 3.6 mmol/L (ref 3.5–5.1)
Sodium: 141 mmol/L (ref 135–145)

## 2019-02-24 LAB — CBC
HCT: 48.1 % — ABNORMAL HIGH (ref 36.0–46.0)
Hemoglobin: 15.2 g/dL — ABNORMAL HIGH (ref 12.0–15.0)
MCH: 29.3 pg (ref 26.0–34.0)
MCHC: 31.6 g/dL (ref 30.0–36.0)
MCV: 92.9 fL (ref 80.0–100.0)
Platelets: 293 10*3/uL (ref 150–400)
RBC: 5.18 MIL/uL — ABNORMAL HIGH (ref 3.87–5.11)
RDW: 21.3 % — AB (ref 11.5–15.5)
WBC: 7.8 10*3/uL (ref 4.0–10.5)
nRBC: 0 % (ref 0.0–0.2)

## 2019-02-24 LAB — BRAIN NATRIURETIC PEPTIDE: B Natriuretic Peptide: 1152 pg/mL — ABNORMAL HIGH (ref 0.0–100.0)

## 2019-02-24 MED ORDER — ALPRAZOLAM 0.25 MG PO TABS: 0.2500 mg | ORAL_TABLET | Freq: Every day | ORAL | 0 refills | Status: DC | PRN

## 2019-02-24 MED ORDER — SPIRONOLACTONE 25 MG PO TABS: 25.0000 mg | ORAL_TABLET | Freq: Every day | ORAL | 1 refills | Status: DC

## 2019-02-24 MED ORDER — IVABRADINE HCL 7.5 MG PO TABS: 11.2500 mg | ORAL_TABLET | Freq: Two times a day (BID) | ORAL | 3 refills | Status: DC

## 2019-02-24 MED ORDER — SERTRALINE HCL 50 MG PO TABS: 50.0000 mg | ORAL_TABLET | Freq: Every day | ORAL | 2 refills | Status: DC

## 2019-02-24 NOTE — Patient Instructions (Addendum)
Lab work done today. We will notify you of any abnormal lab work.  TAKE Torsemide 80mg  (4 tabs) TOMORROW ONLY  START Zoloft 50mg  (1 tab) daily. You have been given 2 refills. You will need to follow up with your primary care physician in order to get further refills.  You have been given a prescription for 10 tabs of Xanax. You will need to follow up with you primary care physician for further refills.  Follow up with Dr. [name] in 4 weeks.

## 2019-02-24 NOTE — Progress Notes (Signed)
PCP: Cardiologist: Dr Marlou Porch  Primary HF Cardiologist: Dr [name] CT Surgeon: Dr Roxy Manns Oncologist: Dr [name]   HPI: Brittany [name] is a 58 y.o. female with a history of PAF,preveious smoker quit 4 years ago, hypertension, 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   Admitted 02/03/2018 with NSTEMI and shock. Underwent emergent cath 02/03/18 showed LAD 100% stenosed, LCx 95% stenosed. Taken for emergent CABG 02/03/18. Required impella post op. Hospital course complicated by cardiogenic shock, HCAP, A fib, respiratory failure, and swallowing issues. She was discharged to SNF. Discharge weight 103 pounds.   Admitted 5/10-5/16/19 from HF clinic with acute respiratory failure and concern for low output HF. She had been off medications PTA due to inability to afford them after leaving SNF. She required BiPAP on admit. CXR showed R pleural effusion. Had thoracentesis with cytology negative for malignancy. Co-ox was 65%. HF meds gradually added back. Required O2 at discharge. Discharged to SNF. DC weight: 101 lbs.   Admitted with increased dyspnea 5/25 through 05/10/2018. Recurrent respiratory failure. Underwent thoracentesis. Completed antibioitc course. Discharged to home.   She went to [facility] in [place] twice with SOB. Lasix was increased from 20 mg to 40 mg daily the first visit. Most recently, she was admitted in [place] 7/19-7/22/19 and required IV lasix. Lasix was increased to 40 mg BID at discharge. (per patient, no records available in epic)  Returned to [facility] 11/2018 after a long stay in [place]. She states she was in and out of the hospital her entire stay there due to recurrent pleural effusions. She had a pleurx cath at one point, and pleurodesis performed 09/09/18. Over this time, she spent about 6 weeks total between [facility] and [facility].   Admitted 3/4 - 02/14/2019 with  NSTEMI. Received DES to ostial ramus into distal left main based on cath below. Meds adjusted as tolerated  She presents today for post hospital follow up. She is feeling OK today. She has gained 3 lbs since discharge. No further CP. She did have mild heart burn last night, but she states she is "very familiar" with the type of pain she has had prior to her heart attacks. She is very anxious now having 3, and is worried when she goes to sleep that she won't wake up. She denies dizziness or lightheadedness. Parked at the ED and walked the long way to clinic, so is more SOB in clinic. She is paying for her medicines now without difficult. No palpitations or orthopnea.   Medtronic ICD: Interrogation not available.   Echo 02/12/19: EF 25-30%, RV mildly reduced  LHC 02/12/19:  Ost LAD to Prox LAD lesion is 100% stenosed.  Ost Cx to Prox Cx lesion is 100% stenosed.  Ost Ramus lesion is 99% stenosed.  Post intervention, there is a 0% residual stenosis.  A drug-eluting stent was successfully placed using a Navassa [id number].  SVG and is normal in caliber.  The graft exhibits no disease.  Ost 1st Diag lesion is 70% stenosed.  SVG and is normal in caliber.  Prox Graft lesion is 100% stenosed. 1. Significant underlying two-vessel coronary artery disease with patent SVG to OM and SVG to diagonal which supplies the LAD territory. Atretic LIMA to LAD given competitive flow from SVG to diagonal. Severe ostial ramus artery stenosis not supplied by bypass with his area suggestive of plaque rupture. 2. Severely elevated left ventricular end-diastolic pressure 34 mmHg. Left  ventricular angiography was not performed. 3. Successful angioplasty and drug-eluting stent placement to the ostial ramus extending into the distal left main.  Echo 09/04/18 ([facility]) LVEF 20%, Moderate AI, Mild MR, Mild TR, Severe LAE, RV mildly decreased,   CT chest 09/23/18 ([facility]) 1. Slight increase in size of  loculated right hydropneumothorax, status post thoracostomy tube removal. 2. Increased bilateral smooth septal thickening, groundglass, and new trace left pleural effusion, most likely representing pulmonary edema. 3. There are a few discrete solid and groundglass nodules bilaterally, some of which were present on the prior exam. This may be secondary to edema or superimposed infection/inflammation. Recommend attention on follow-up. 4. Right hilar consolidation with air bronchograms, partially obscured by adjacent fluid and partial atelectasis, likely related to radiation change and previously was only very mild FDG avidity on prior PET.   CT chest 03/27/18 1. No definite findings of locally recurrent tumor in the right lung. 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 Continued chest CT surveillance is advised in 3-6 months. 2. No thoracic adenopathy or other findings of metastatic disease in the chest. 3. Small to moderate dependent right pleural effusion, increased. 4. Stable mild cardiomegaly. 5. Mild patchy ground-glass opacities in both lungs are decreased in the interval, favor improved mild residual pulmonary edema. 6. New complete right middle lobe atelectasis.  ECHO 04/20/2018  Left ventricle: Basal function preserved remainder of ventrical serverely hypokinetic The cavity size was severely dilated. Wall thickness was normal. The estimated ejection fraction was 20%. Left ventricular diastolic function parameters were normal. - Aortic valve: There was mild to moderate regurgitation. - Mitral valve: There was mild regurgitation. - Atrial septum: No defect or patent foramen ovale was identified.  ECHO  03/31/2018  Left ventricle: Basal inferior and anterior function preserved   remainder of ventrical serverely hypokinetic with septal and   apical akinesis The cavity size was severely dilated. Wall    thickness was normal. The estimated ejection fraction was 25%.   Doppler parameters are consistent with both elevated ventricular   end-diastolic filling pressure and elevated left atrial filling   pressure. - Aortic valve: There was moderate regurgitation. - Mitral valve: There was mild regurgitation. - Left atrium: The atrium was mildly dilated. - Atrial septum: No defect or patent foramen ovale was identified.  ECHO 02/11/2018  - Left ventricle: The cavity size was normal. There was mild   concentric hypertrophy. Systolic function was severely reduced.   The estimated ejection fraction was in the range of 25% to 30%.   There is akinesis of the mid-apical anterior, lateral,   inferolateral, inferior, apical septal and apical myocardium.   There is severe hypokinesis of the basalanteroseptal and   inferoseptal myocardium. Features are consistent with a   pseudonormal left ventricular filling pattern, with concomitant   abnormal relaxation and increased filling pressure (grade 2   diastolic dysfunction). Doppler parameters are consistent with   high ventricular filling pressure. - Aortic valve: There was severe regurgitation by pressure halftime   of 133 msec but visually only appears moderate. - Mitral valve: Calcified annulus. There was trivial regurgitation.  LHC 02/03/2018    Prox RCA to Mid RCA lesion is 25% stenosed.  Ost Ramus lesion is 70% stenosed.  Ost Cx to Prox Cx lesion is 50% stenosed.  Ost LAD to Prox LAD lesion is 100% stenosed.  LV end diastolic pressure is severely elevated.  Hemodynamic findings consistent with severe pulmonary hypertension.  Post intervention, there is a 100% residual stenosis.  Review of systems complete and found to be negative unless listed in HPI.    SH:  Social History   Socioeconomic History  . Marital status: Married    Spouse name: Not on file  . Number of children: Not on file  . Years of education: Not on file  . Highest  education level: Not on file  Occupational History  . Not on file  Social Needs  . Financial resource strain: Not on file  . Food insecurity:    Worry: Not on file    Inability: Not on file  . Transportation needs:    Medical: Not on file    Non-medical: Not on file  Tobacco Use  . Smoking status: Former Smoker    Packs/day: 1.00    Years: 20.00    Pack years: 20.00    Types: Cigarettes    Last attempt to quit: 03/13/2014    Years since quitting: 4.9  . Smokeless tobacco: Never Used  Substance and Sexual Activity  . Alcohol use: Yes    Alcohol/week: 8.0 standard drinks    Types: 8 Glasses of wine per week  . Drug use: No  . Sexual activity: Never  Lifestyle  . Physical activity:    Days per week: Not on file    Minutes per session: Not on file  . Stress: Not on file  Relationships  . Social connections:    Talks on phone: Not on file    Gets together: Not on file    Attends religious service: Not on file    Active member of club or organization: Not on file    Attends meetings of clubs or organizations: Not on file    Relationship status: Not on file  . Intimate partner violence:    Fear of current or ex partner: Not on file    Emotionally abused: Not on file    Physically abused: Not on file    Forced sexual activity: Not on file  Other Topics Concern  . Not on file  Social History Narrative  . Not on file    FH:  Family History  Problem Relation Age of Onset  . Heart disease Mother   . Hypertension Mother   . Heart attack Mother   . Hypertension Maternal Grandmother   . Cancer Maternal Grandmother   . Diabetes Paternal Grandmother   . Stroke Neg Hx     Past Medical History:  Diagnosis Date  . Acute systolic congestive heart failure ([place]) 02/03/2018  . Allergy   . Anxiety   . Asthma   . Hypertension   . PAF (paroxysmal atrial fibrillation) ([place])   . Prophylactic measure 08/03/14-08/19/14   Prophyl. cranial radiation 24 Gy  . S/P emergency CABG x 3  02/03/2018   LIMA to LAD, SVG to D1, SVG to OM1, EVH via right thigh with implantation of Impella LD LVAD via direct aortic approach  . Small cell lung cancer ([place]) 03/16/2014    Current Outpatient Medications  Medication Sig Dispense Refill  . albuterol (PROVENTIL HFA;VENTOLIN HFA) 108 (90 Base) MCG/ACT inhaler Inhale 2 puffs into the lungs every 6 (six) hours as needed for wheezing or shortness of breath. 1 Inhaler 0  . ALPRAZolam (XANAX) 0.25 MG tablet Take 0.25 mg by mouth every 8 (eight) hours as needed for anxiety.   0  . aspirin 81 MG EC tablet Take 1 tablet (81 mg total) by mouth daily.  30 tablet 1  . atorvastatin (LIPITOR) 80 MG tablet Take 1 tablet (80 mg total) by mouth daily at 6 PM. 30 tablet 1  . ipratropium-albuterol (DUONEB) 0.5-2.5 (3) MG/3ML SOLN Take 3 mLs by nebulization 2 (two) times daily. (Patient taking differently: Take 3 mLs by nebulization daily as needed (SOB). ) 360 mL 0  . ivabradine (CORLANOR) 7.5 MG TABS tablet Take 1.5 tablets (11.25 mg total) by mouth 2 (two) times daily with a meal. 45 tablet 3  . nitroGLYCERIN (NITROSTAT) 0.4 MG SL tablet Place 1 tablet (0.4 mg total) under the tongue every 5 (five) minutes as needed for chest pain. 90 tablet 5  . oxyCODONE (OXY IR/ROXICODONE) 5 MG immediate release tablet Take 5 mg by mouth every 6 (six) hours as needed for moderate pain.   0  . spironolactone (ALDACTONE) 25 MG tablet Take 1 tablet (25 mg total) by mouth daily. 30 tablet 1  . ticagrelor (BRILINTA) 90 MG TABS tablet Take 1 tablet (90 mg total) by mouth 2 (two) times daily. 60 tablet 5  . torsemide (DEMADEX) 20 MG tablet Take 3 tablets (60 mg total) by mouth daily. Take additional as directed by HF team. 110 tablet 6   No current facility-administered medications for this encounter.    Vitals:   02/24/19 1359  BP: 112/78  Pulse: 81  SpO2: 100%  Weight: 47.4 kg (104 lb 9.6 oz)     Wt Readings from Last 3 Encounters:  02/24/19 47.4 kg (104 lb 9.6 oz)   02/14/19 46.2 kg (101 lb 14.4 oz)  01/14/19 44.9 kg (99 lb)   PHYSICAL EXAM General: NAD  HEENT: Normal Neck: Supple. JVP 5-6. Carotids 2+ bilat; no bruits. No thyromegaly or nodule noted. Cor: PMI nondisplaced. RRR, No M/G/R noted Lungs: CTAB, normal effort. Abdomen: Soft, non-tender, non-distended, no HSM. No bruits or masses. +BS  Extremities: No cyanosis, clubbing, or rash. R and LLE no edema.  Neuro: Alert & orientedx3, cranial nerves grossly intact. moves all 4 extremities w/o difficulty. Affect pleasant   ASSESSMENT & PLAN:  1. Chronic systolic HF due to ICM. S/p Medtronic single chamber ICD. -Echo 09/04/18 ([facility]) LVEF 20%, Moderate AI, Mild MR, Mild TR, Severe LAE, RV mildly decreased.  - Echo 02/12/19: EF 25-30%, RV mildly reduced - NYHA III, confounded by deconditioning. - Volume status stable on exam.   - Continue torsemide 60 mg daily.  - Continue spiro 25 mg qHS - Continue corlanor 7.5 mg BID - She is followed by HF paramedicine in the community.  2. CAD  - Hx of NSTEMI/STEMI s/p Emergent CABG 02/03/18: Cath with severe 2v CAD as above with severe LV dysfunction/ICM. s/p Emergent CABG 02/03/18. - Had NSTEMI and now s/p LHC 02/12/19 with DES to the ostial ramus extending into the distal left main - She will need DAPT for at least 1 year. On ASA and Brilinta. Price for Brilinta is $3.90 - No s/s of ischemia.    - Encouraged CR and mobility.  3. Recurrent pleural effusions - Trace pleural effusion on right on CXR on recent admit. Marland Kitchen   4. Chronic hypoxic respiratory failure - Wears O2 PRN at home. Stable.   5. PAF - CHA2DS2/VASc is at least 4. (CHF, Vasc disease, HTN, Female).  - She has history of Afib RVR in the past in the setting of her Lung CA. Previously on Xarelto but stopped.  - Continue ASA and brilinta.  - No afib by recent ICD interrogation. Continue corlanor.  6. Hypokalemia - K 4.1 on discharge. BMET Today.   7. H/o SCLC: s/p treatment 2015.  Lost to f/u since 04/2015.  - Chest CT 02/19/18 with mass-like consolidation in R hilum concerning for recurrent tumor.  -Repeat CT 03/27/2018 -No definite findings of locally recurrent tumor in the right lung. 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 Continued chest CT surveillance is advised in 3-6 months. - S/p thoracentesis 04/2018. Cytology negative for malignancy.  - CT chest 09/23/18 with slight increase in size of loculated right hydropneumothorax, status post thoracostomy tube removal. No evidence of tumor noted.  - No change  8. Anxiety/Depression - Discussed with Dr. [name]. With prolonged QT, will start on Zoloft 50 mg daily.  - Will give #10 tabs xanax 0.25 mg daily as needed with no refills.  - Further per PCP.   Will try and schedule 4 week follow up, sooner with symptoms. Doing well overall today. Biggest complaint is anxiety as above.   Shirley Friar, PA-C 2:03 PM   Greater than 50% of the 25 minute visit was spent in counseling/coordination of care regarding disease state education, salt/fluid restriction, sliding scale diuretics, and medication compliance.

## 2019-02-26 ENCOUNTER — Telehealth (HOSPITAL_COMMUNITY): Payer: Self-pay | Admitting: Cardiology

## 2019-02-26 ENCOUNTER — Telehealth (HOSPITAL_COMMUNITY): Payer: Self-pay

## 2019-02-26 MED ORDER — POTASSIUM CHLORIDE CRYS ER 20 MEQ PO TBCR: 20.0000 meq | EXTENDED_RELEASE_TABLET | Freq: Every day | ORAL | 3 refills | Status: DC

## 2019-02-26 NOTE — Telephone Encounter (Signed)
Notes recorded by Kerry Dory, CMA on 02/26/2019 at 2:53 PM EDT Patient is enrolling with [facility], [name] has been in contact with patient today and will deliver medication changes. ------  Notes recorded by Kerry Dory, CMA on 02/26/2019 at 2:46 PM EDT Unable to reach patient. No answer unable to leave [phone] (M)  ------  Notes recorded by Kerry Dory, CMA on 02/25/2019 at 9:20 AM EDT Left message for patient to call back. [phone] (M) ------  Notes recorded by Shirley Friar, PA-C on 02/25/2019 at 7:57 AM EDT Please have her take 80 mg of torsemide today as planned, and repeat tomorrow and Wednesday. Need to also add potassium 20 meq daily, would have her take 40 meq (2 tabs) today.    Legrand Como [address] [place], PA-C 02/25/2019 7:57 AM

## 2019-02-26 NOTE — Telephone Encounter (Signed)
I called pt to confirm today's CHP visit. Pt stated that she feels nauseous and drowsy.  She stated that she thinks that these symptoms is because she took the "new medication sertraline".  She asked that we change today's visit to Friday, which I agreed.

## 2019-02-26 NOTE — Telephone Encounter (Signed)
[name] notified me that she has been trying to contact pt about the lab results from [place] this week.  She gave me the instructions to relay to Brittany [name].  I text pt to give her both the instructions and I advised her to take the sertraline at night before she goes to sleep.  Pt stated that she doesn't have a refill for potassium

## 2019-02-28 ENCOUNTER — Telehealth (HOSPITAL_COMMUNITY): Payer: Self-pay

## 2019-02-28 IMAGING — CR DG CHEST 2V
2 series · 2 of 2 positions shown · non-contrast
Comparison: October 14, 2017

CLINICAL DATA: Epigastric pain.  Cough.

EXAM:
CHEST  2 VIEW

[w chest lat]
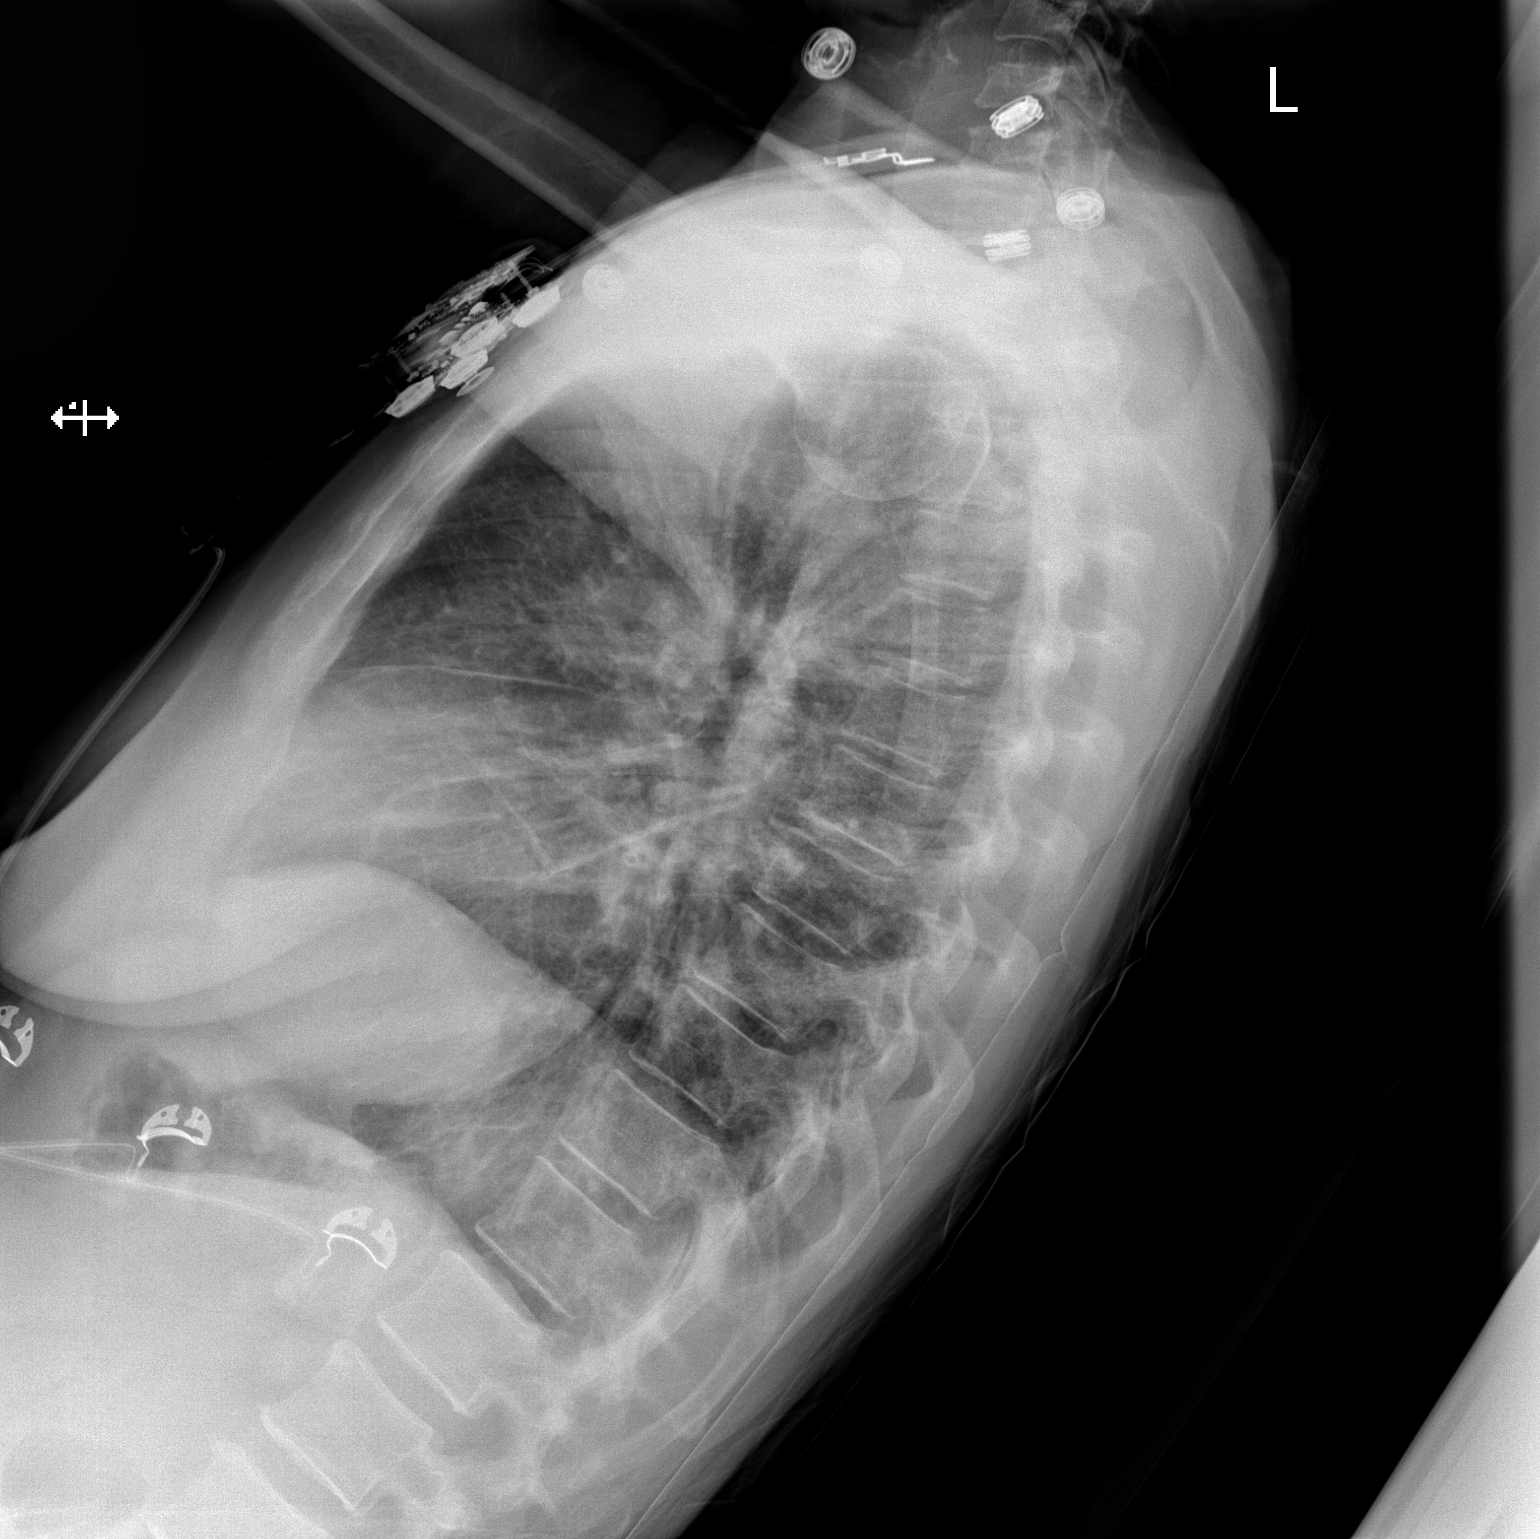

[x chest ap]
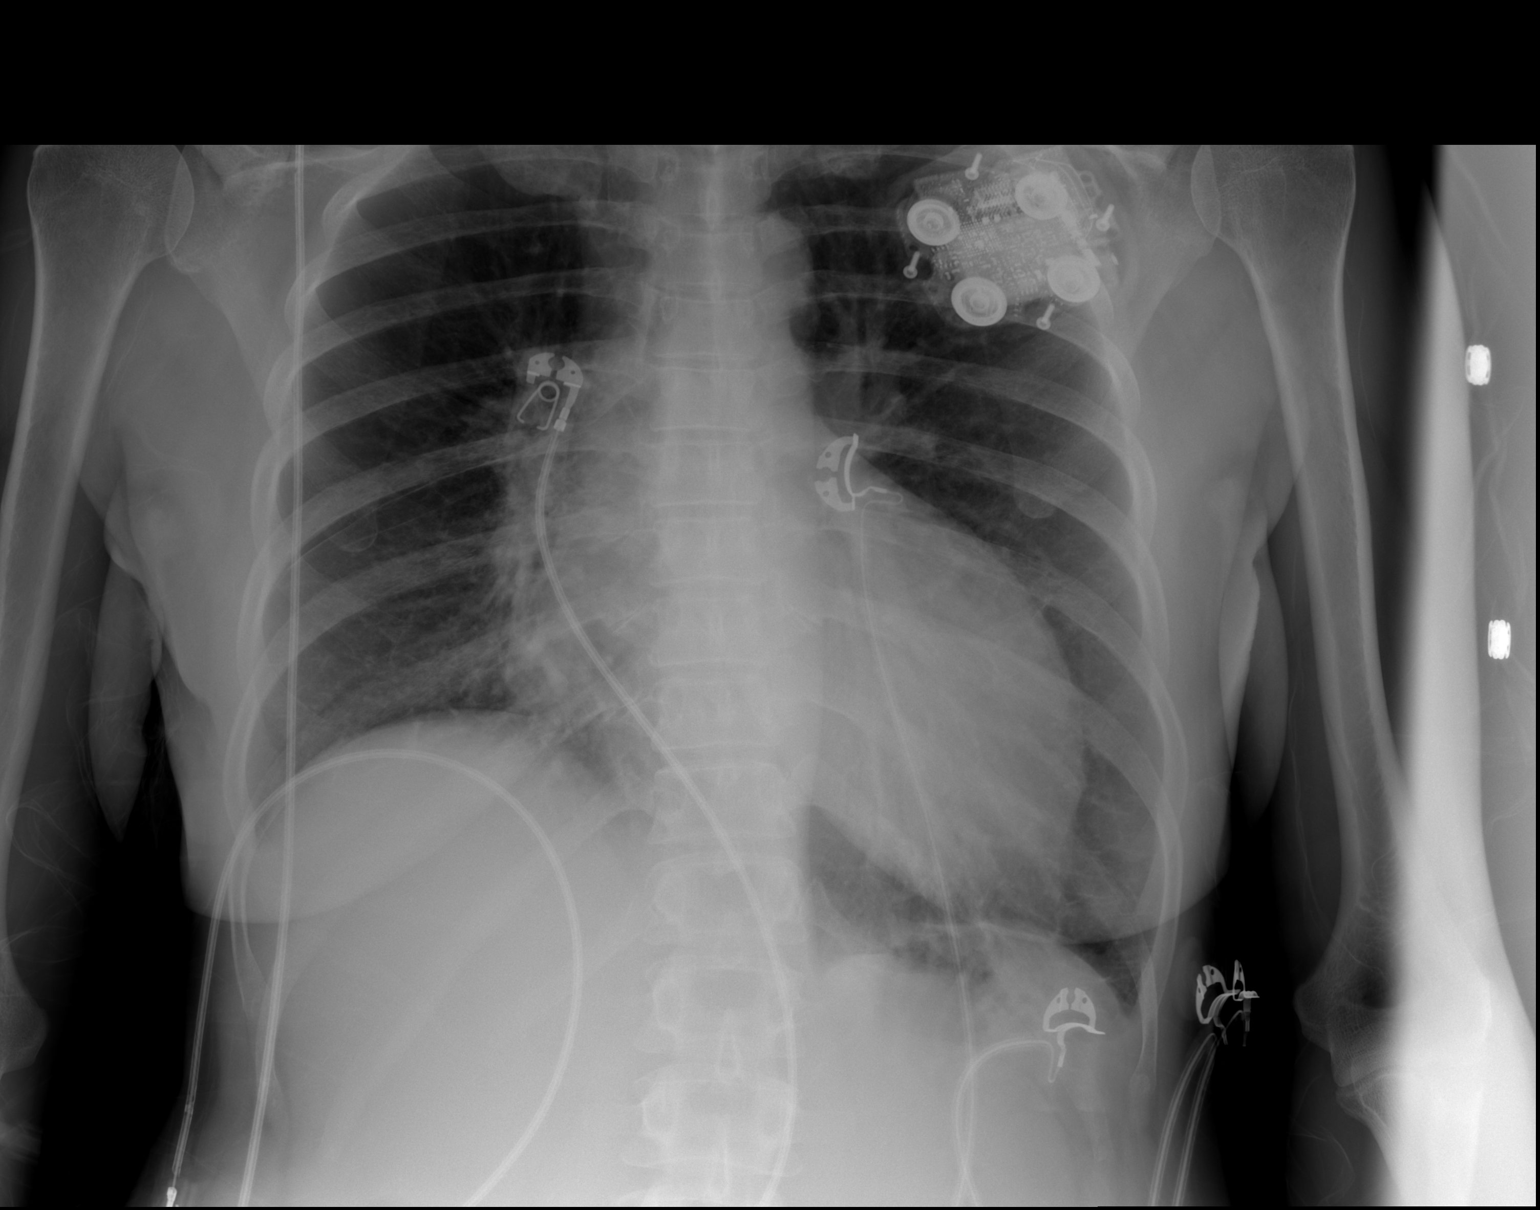

[2 of 2 positions shown; findings below may reference images not displayed]

FINDINGS: Elevation of the right hemidiaphragm. The heart, hila, and
mediastinum are stable. No pneumothorax. No nodules or masses. No
focal infiltrates identified. The left upper chest is obscured by an
overlapping device.
IMPRESSION: No active cardiopulmonary disease.

## 2019-02-28 IMAGING — DX DG CHEST 1V PORT
1 series · 1 of 1 positions shown · non-contrast
Comparison: 02/03/2018

CLINICAL DATA: Post CABG

EXAM:
PORTABLE CHEST 1 VIEW

[chest ap]
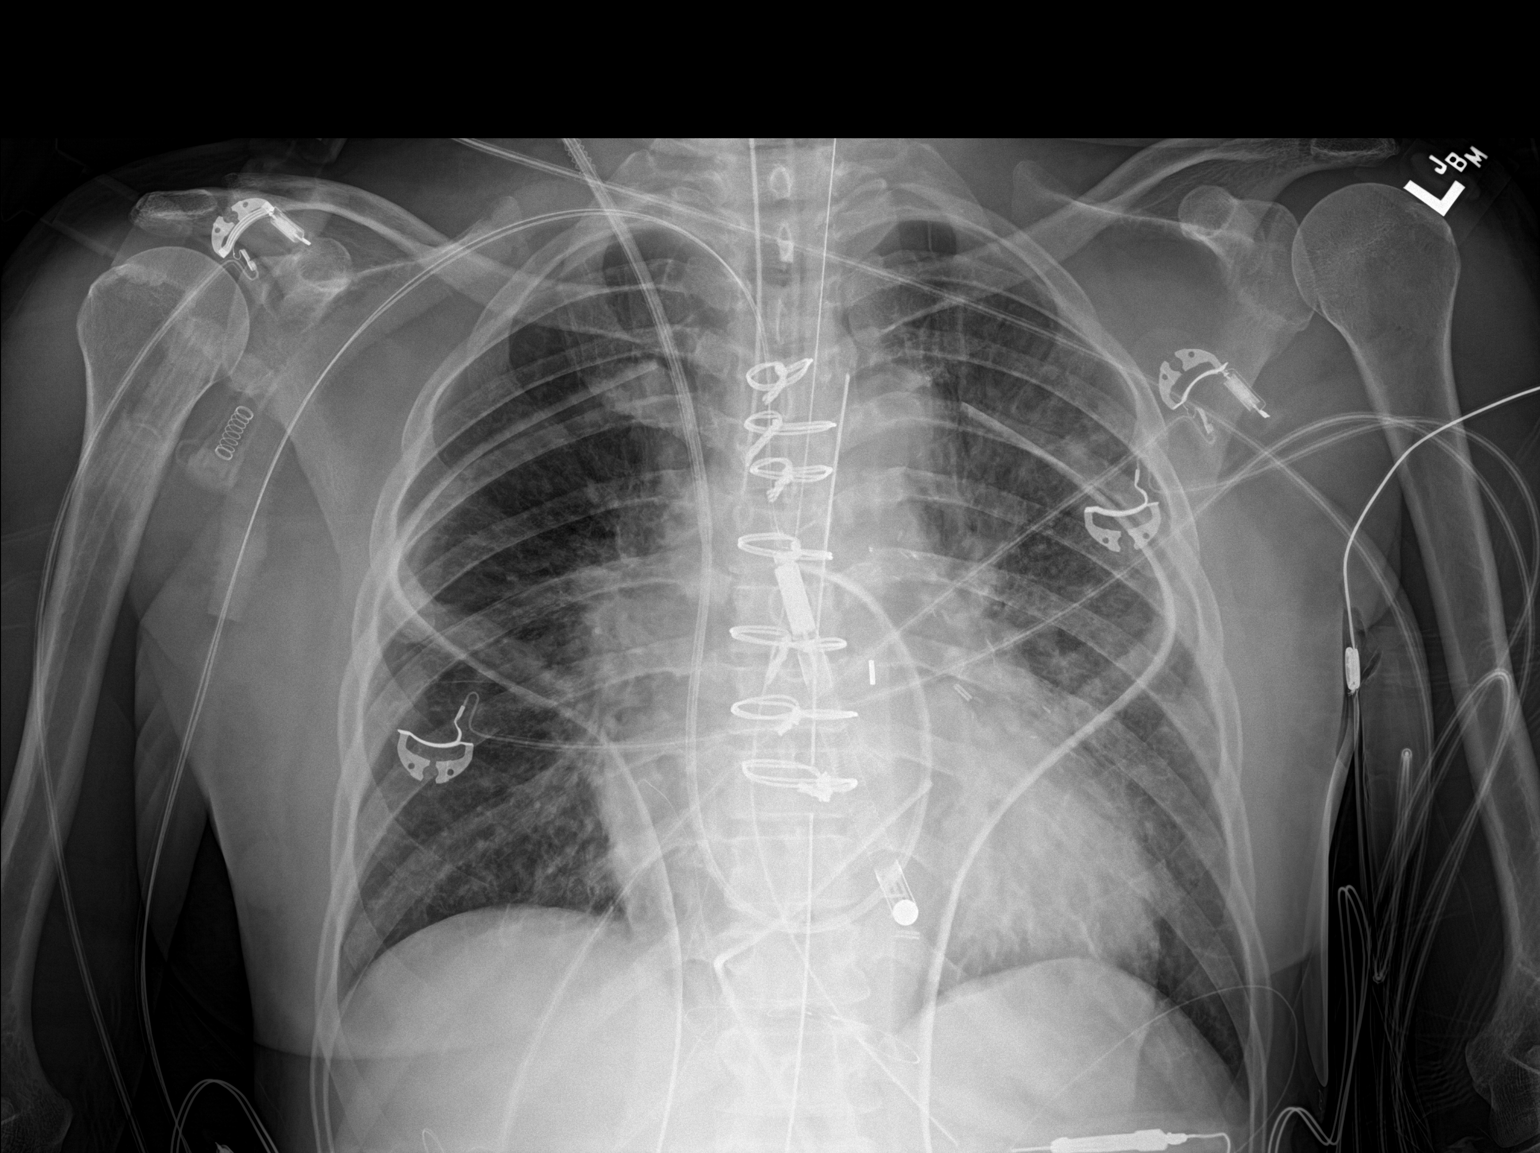

[1 of 1 positions shown; findings below may reference images not displayed]

FINDINGS: Endotracheal tube is 2 cm above the carina. Swan-Ganz catheter tip
is in the central right pulmonary artery. NG tube enters the
stomach. Bilateral chest tubes in place. No pneumothorax. Heart is
borderline in size. Mild perihilar atelectasis or edema. No
effusions.
IMPRESSION: Postoperative changes from CABG.  No pneumothorax.

Bilateral perihilar atelectasis or early edema.

## 2019-02-28 NOTE — Telephone Encounter (Signed)
Brittany [name] called to reschedule our [facility] visit.  She stated that she feels a little better than Wednesday but she is not "up for a visit today".  She denies chest pain, sob and dizziness.  She stated that "she's just tired and is resting". We agreed to visit week.

## 2019-02-28 NOTE — Telephone Encounter (Signed)
Called and spoke with pt in regards to CR, adv we have recv'd the pt referral. And at this time we are not scheduling due to the COVID-19. Once we have resume scheduling we will contact the pt. She/He verbalized understanding. °Gloria W. Support Rep II °

## 2019-03-01 IMAGING — CR DG CHEST 1V PORT
1 series · 1 of 1 positions shown · non-contrast
Comparison: February 03, 2018

CLINICAL DATA: Hypoxia.  History of lung carcinoma

EXAM:
PORTABLE CHEST 1 VIEW

[AP]
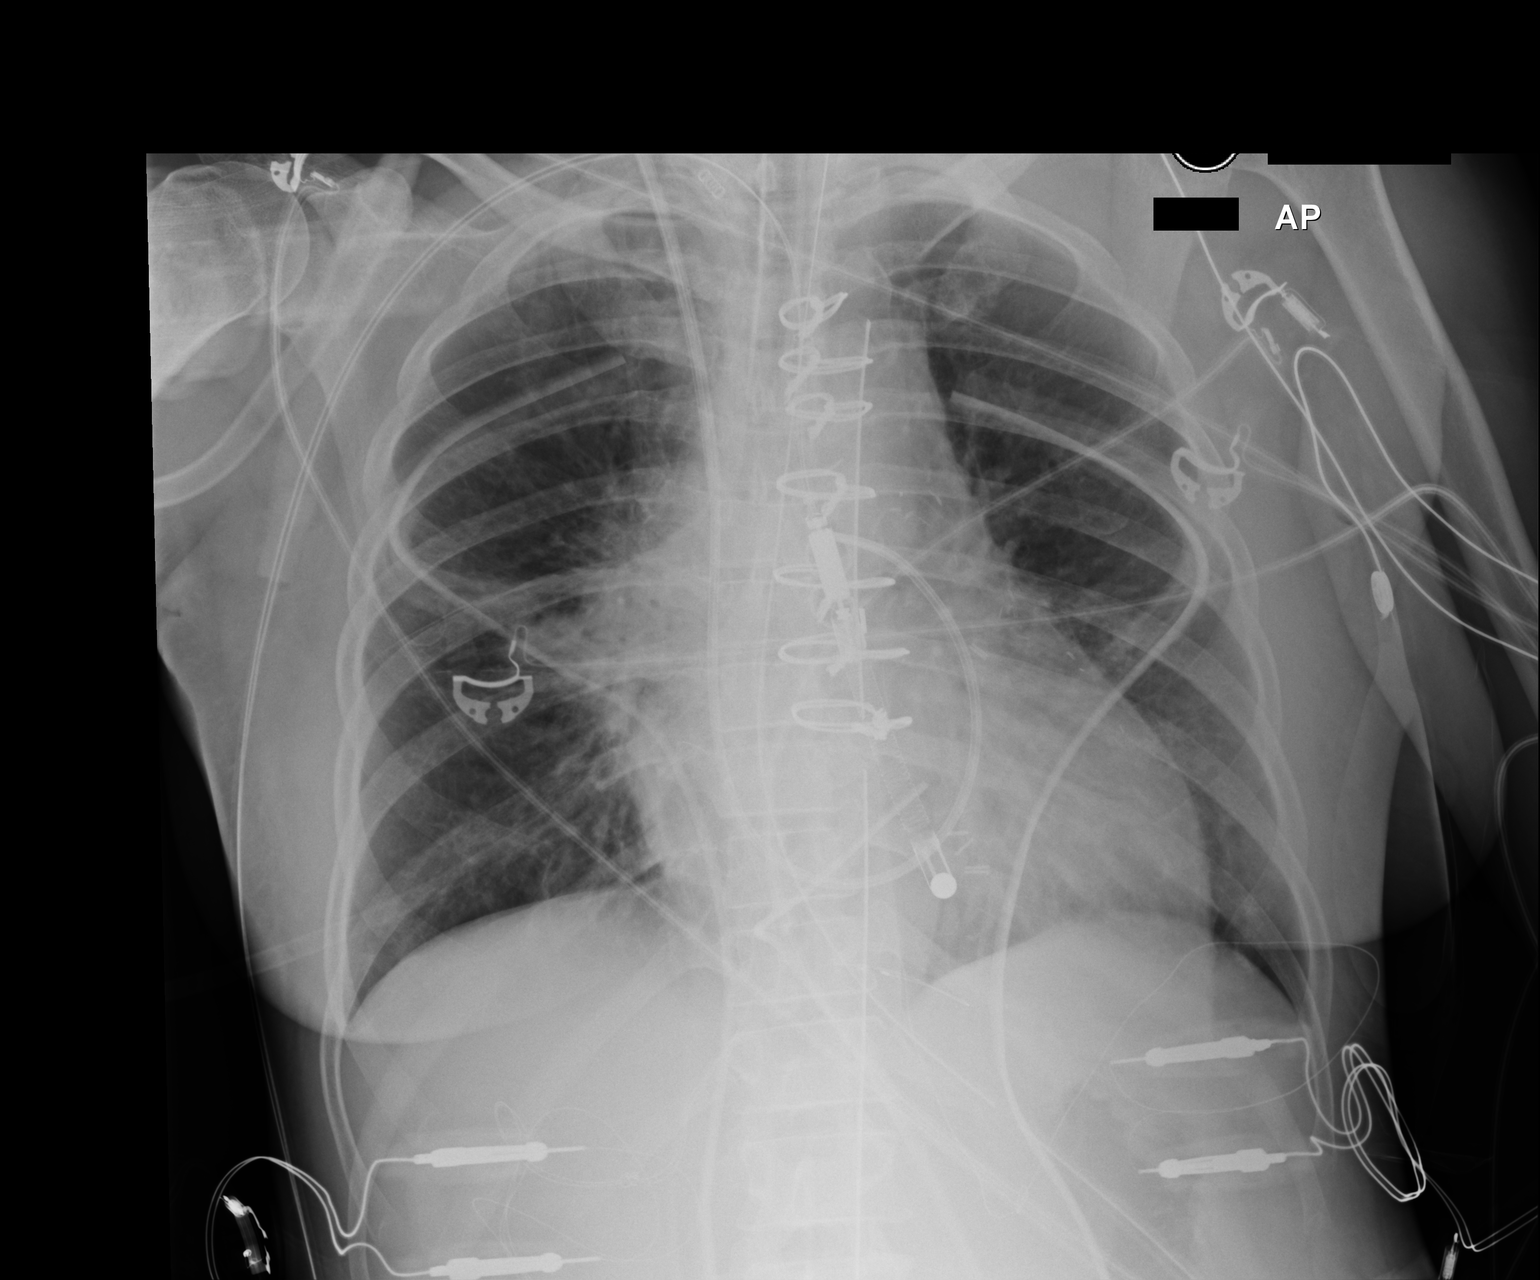

[1 of 1 positions shown; findings below may reference images not displayed]

FINDINGS: Endotracheal tube tip is 3.0 cm above the carina. Swan-Ganz catheter
tip is in the right main pulmonary artery. Nasogastric tube tip and
side port are below the diaphragm. There are chest tubes
bilaterally. There is also a mediastinal drain. No pneumothorax.

There is apparent radiation therapy change in the right perihilar
region. There is postoperative change on the left. No edema or
consolidation. Heart size normal. Pulmonary vascularity is distorted
on the right appears normal on the left. External pacemaker noted.
IMPRESSION: Tube and catheter positions as described without pneumothorax.
Postoperative change on the left. Presumed radiation therapy change
right perihilar region. Stable cardiac silhouette.

## 2019-03-03 IMAGING — DX DG CHEST 1V PORT
1 series · 1 of 1 positions shown · non-contrast
Comparison: 02/05/2018

CLINICAL DATA: Reason for exam: CHF Nurse states patient had CABG
02/03/2018. She also states that movement of patient is causing a
large drop in blood pressure. Hx of acute systolic CHF 02/03/2018,
asthma, HTN, PAF, small cell lung cancer 03/16/2014. IABP insertion
02/03/2018. CABG x3 02/03/2018.

EXAM:
PORTABLE CHEST 1 VIEW

[chest]
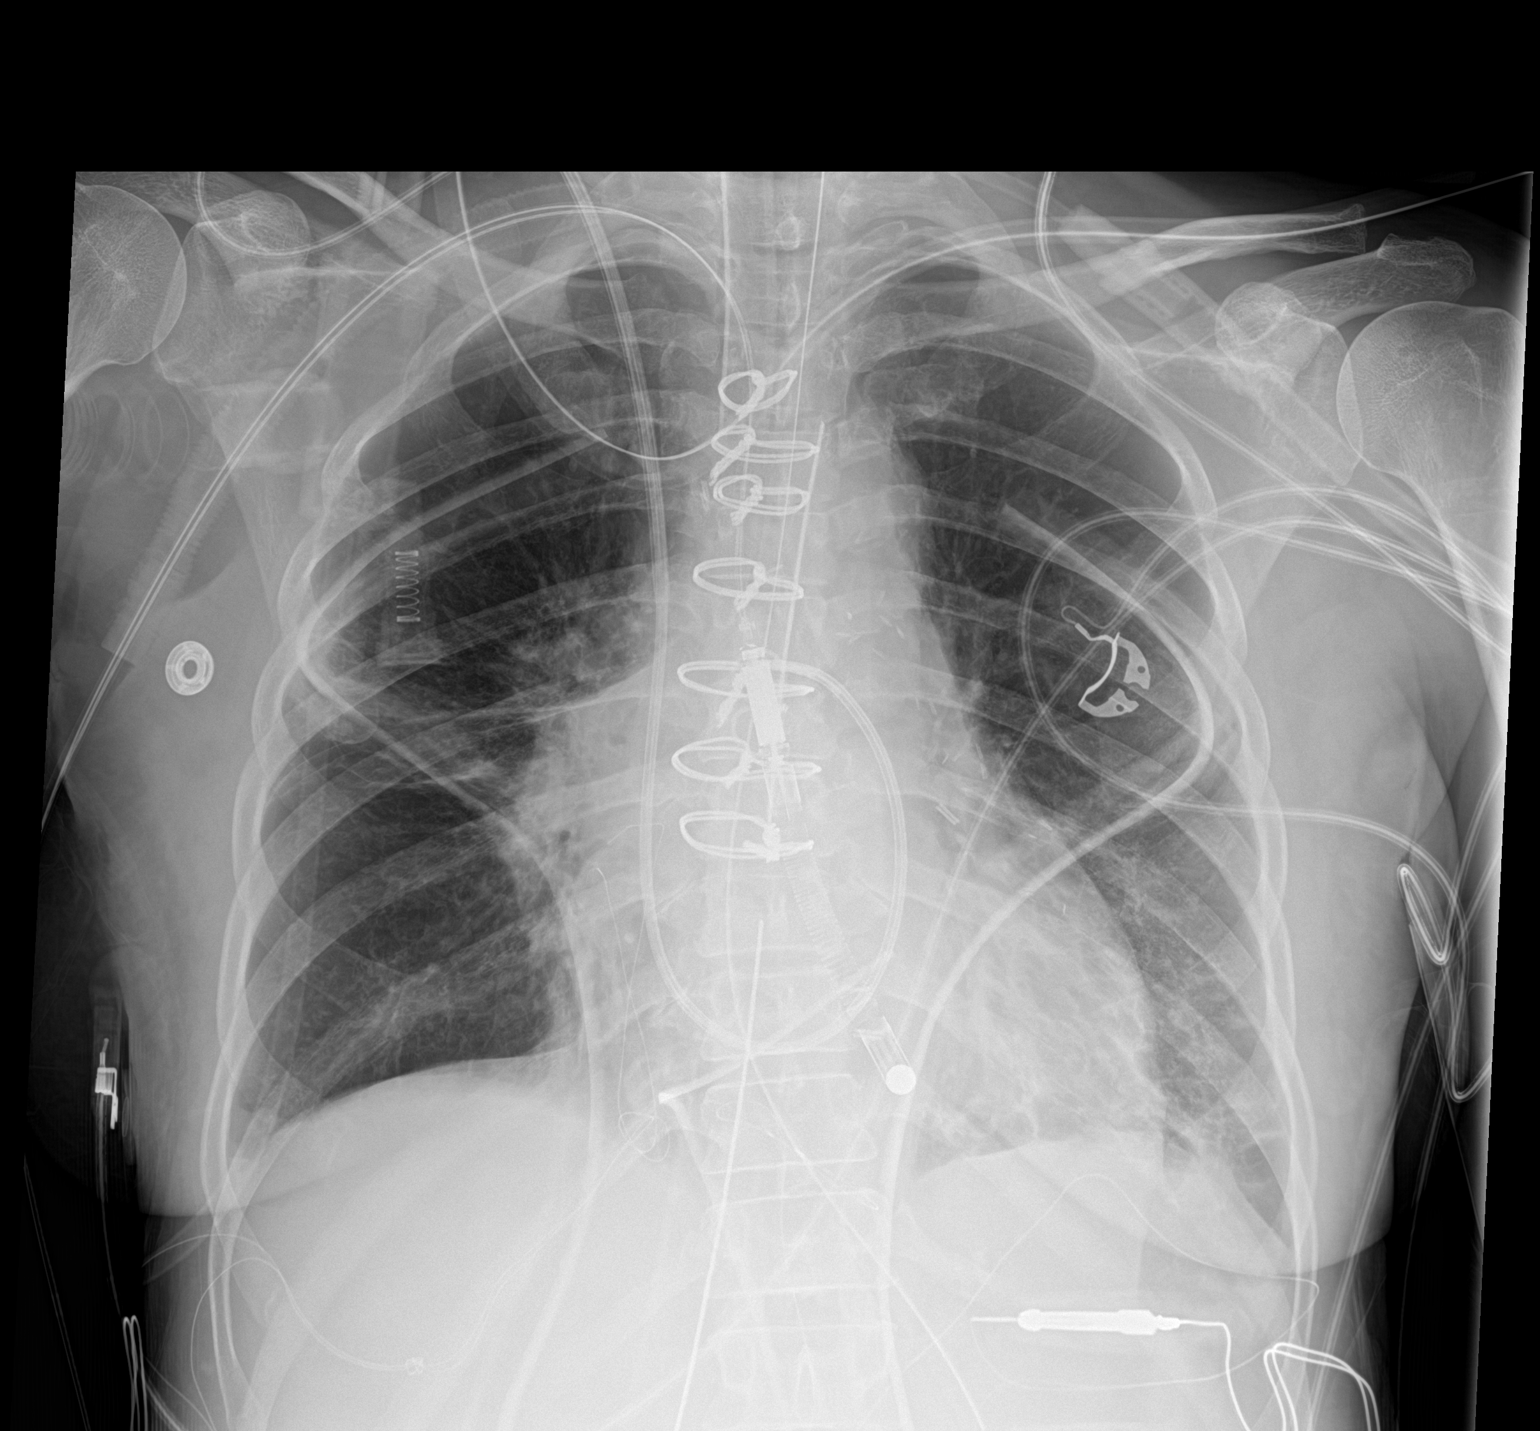

[1 of 1 positions shown; findings below may reference images not displayed]

FINDINGS: Endotracheal tube is in place, tip approximately 3.9 centimeters
above the carina. Nasogastric tube is off the image beyond the
gastroesophageal junction. Mediastinal drain and epicardial pacing
leads are present. Bilateral chest tubes are in place. RIGHT IJ
Swan-Ganz catheter tip overlies the level of the pulmonary outflow
tract.

Heart size is mildly enlarged. Status post median sternotomy and
CABG. There is perihilar atelectasis and mild interstitial edema.
LEFT basilar atelectasis. No pneumothorax.
IMPRESSION: 1. Lines and tubes as described.
2. Perihilar and LEFT LOWER lobe atelectasis and mild interstitial
edema.

## 2019-03-04 IMAGING — DX DG CHEST 1V PORT
1 series · 1 of 1 positions shown · non-contrast
Comparison: February 06, 2018

CLINICAL DATA: Hypoxia

EXAM:
PORTABLE CHEST 1 VIEW

[chest ap]
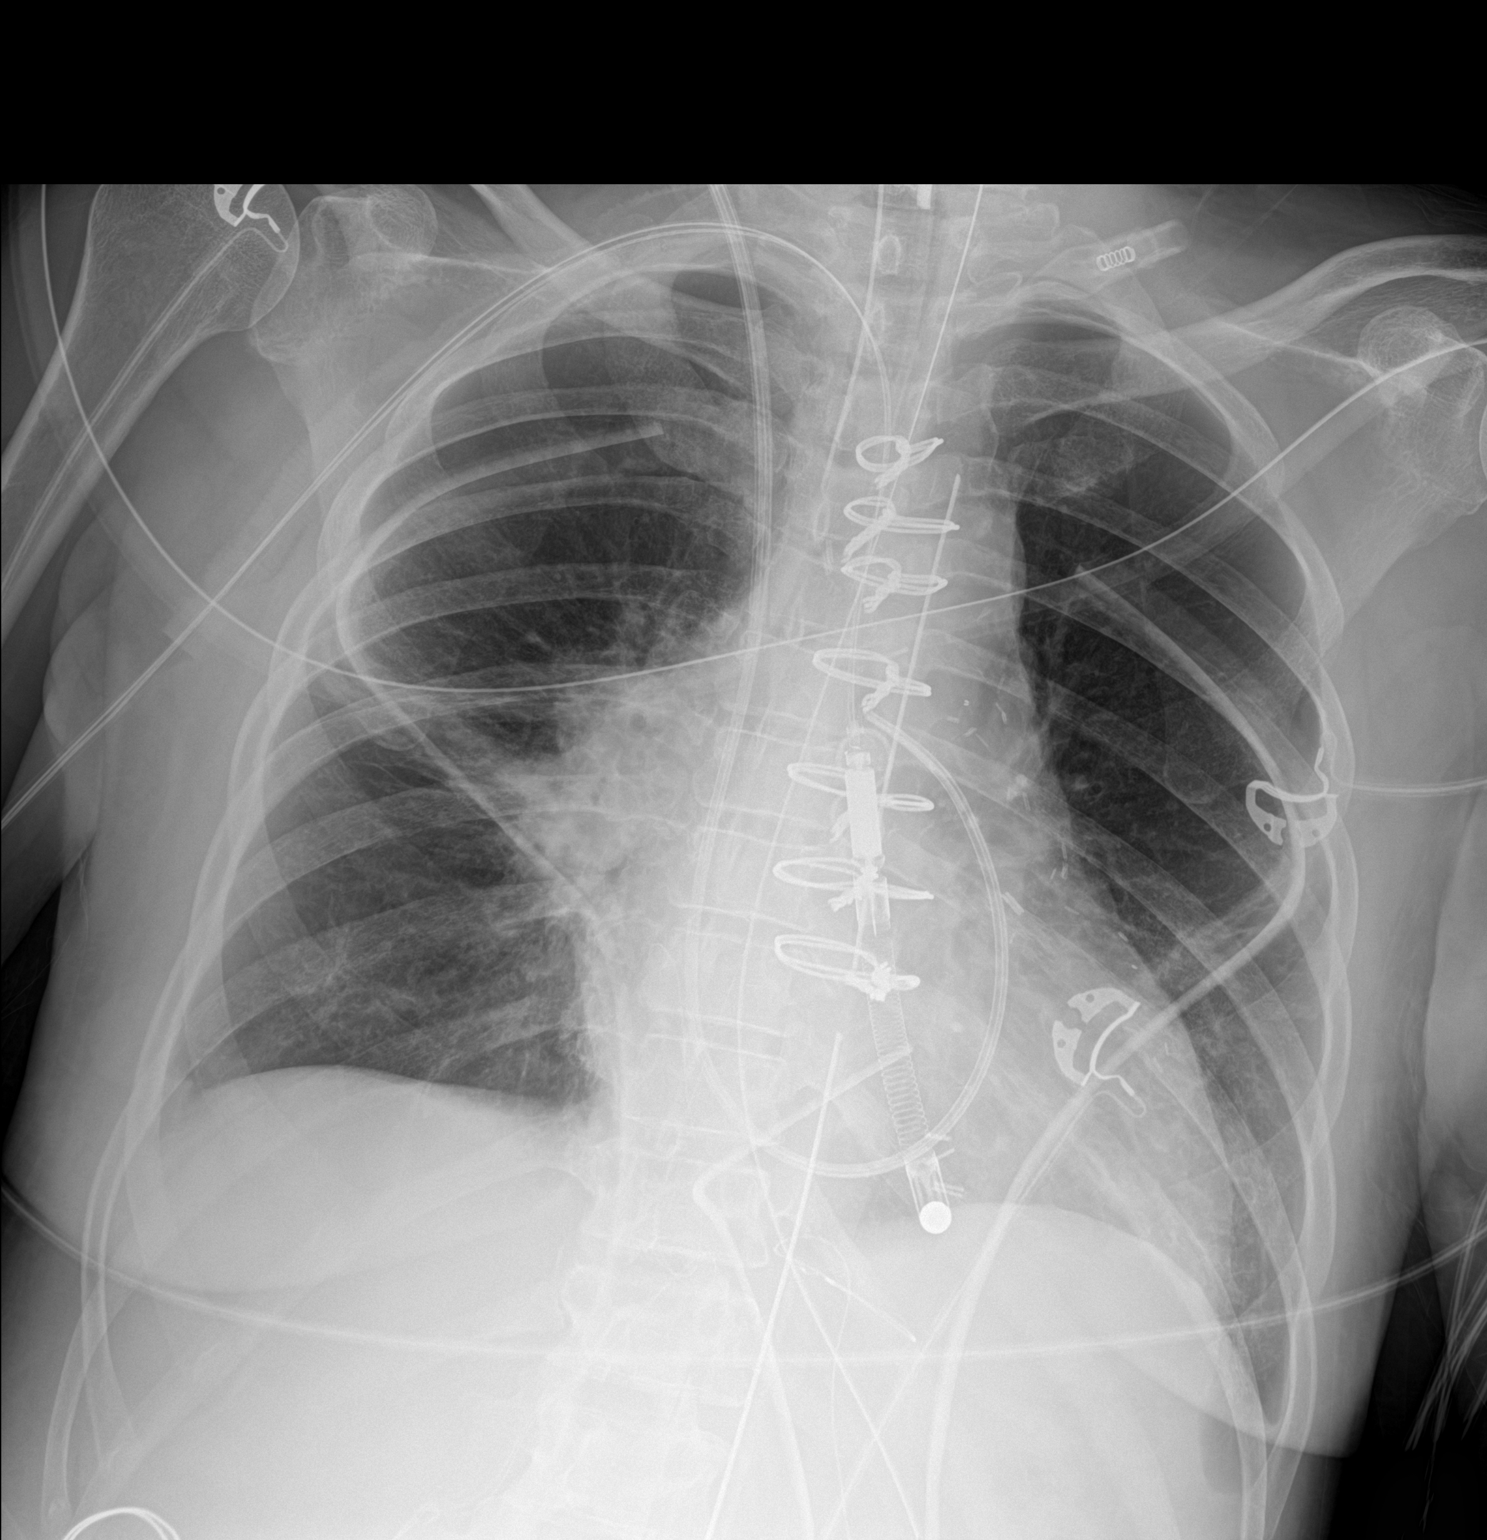

[1 of 1 positions shown; findings below may reference images not displayed]

FINDINGS: Endotracheal tube tip is 3.5 cm above the carina. Swan-Ganz catheter
tip is in the right main pulmonary artery. Nasogastric tube tip and
side port are below the diaphragm. Bilateral chest tubes and
mediastinal drain persist without change in position. There are
temporary pacemaker wires attached to the right heart. No
pneumothorax.

There remains consolidation in the right perihilar region without
appreciable change. There is atelectatic change in both bases. Heart
size is upper normal with pulmonary vascularity within normal limits
on the left at distortion of the pulmonary vascularity on the right,
stable. Surgical clips are noted over the left perihilar region,
stable.
IMPRESSION: Tube and catheter positions as described without pneumothorax.
Persistent right perihilar region soft tissue prominence. Question
postoperative hemorrhage/hematoma in this area. Areas of atelectatic
change in the lower lung zones remain stable. No new opacity
evident. Stable cardiac silhouette.

## 2019-03-10 ENCOUNTER — Other Ambulatory Visit: Payer: Self-pay

## 2019-03-10 ENCOUNTER — Ambulatory Visit (INDEPENDENT_AMBULATORY_CARE_PROVIDER_SITE_OTHER): Payer: BLUE CROSS/BLUE SHIELD

## 2019-03-10 DIAGNOSIS — Z95810 Presence of automatic (implantable) cardiac defibrillator: Secondary | ICD-10-CM

## 2019-03-10 DIAGNOSIS — I509 Heart failure, unspecified: Secondary | ICD-10-CM

## 2019-03-11 NOTE — Progress Notes (Signed)
EPIC Encounter for ICM Monitoring  Patient Name: Brittany [name] is a 58 y.o. female Date: 03/11/2019 Primary Care Physican: Maude Leriche, [place] Primary Cardiologist: [place] Electrophysiologist: Allred Last Weight: 101 lbs (baseline)  03/11/2019 Weight: 103 lbs                                                           Heart Failure questions reviewed.  Pt had weight gain of 2 lbs which for identifies she has fluid build up.  Hospitalized 3/4 - 3/6 for heart attack.  She reports this is her 3rd heart attack.  She identified the symptoms at home and called 911.  She made to the hospital before having the heart attack.    Report: Thoracic impedance abnormal suggesting fluid accumulation since 03/03/2019.   Prescribed: Torsemide 20 mg Take 3 tablets (60 mg total) by mouth daily. Take additional as directed by HF team.  Labs: 02/24/2019 Creatinine 1.04, BUN 16, Potassium 3.6, Sodium 141, GFR 59->60 02/14/2019 Creatinine 1.10, BUN 30, Potassium 4.1, Sodium 136, GFR 55->60  02/13/2019 Creatinine 1.02, BUN 21, Potassium 3.8, Sodium 137, GFR >60  02/12/2019 Creatinine 1.26, BUN 22, Potassium 3.4, Sodium 140, GFR 47-54  11/26/2018 Creatinine 1.02, BUN 24, Potassium 3.8, Sodium 139, GFR >60  11/25/2018 Creatinine 1.31, BUN 26, Potassium 3.3, Sodium 139, GFR 45-52  11/24/2018 Creatinine 0.92, BUN 12, Potassium 3.6, Sodium 141, GFR >60  11/15/2018 Creatinine 1.00, BUN 17, Potassium 3.7, Sodium 140, GFR >60  A complete set of results can be found in Results Review.  Recommendations:  She has been directed by HF clinic to take extra Torsemide as needed and will take extra tablet x 3 days or less if weight returns to baseline.  Advised to call back if she continues to have weight gain even with taking Torsemide.  Follow-up plan: ICM clinic phone appointment on 03/19/2019 to recheck fluid symptoms.     Copy of ICM check sent to Dr. Rayann Heman and Dr [name].  3 month ICM trend:  03/10/2019    1 Year ICM trend:       [name], RN 03/11/2019 11:25 AM

## 2019-03-18 ENCOUNTER — Telehealth (HOSPITAL_COMMUNITY): Payer: Self-pay | Admitting: Licensed Clinical Social Worker

## 2019-03-18 NOTE — Telephone Encounter (Signed)
CSW called pt to inform her that community paramedic will be unable to complete home visit this week and to check if she has any concerns.  Pt reports she has all her medications and necessities.  Pt report no new medical concerns at this time.  CSW encouraged pt to reach out if she has any new concerns and we would assist as needed- pt expressed understanding  CSW will continue to follow and assist as needed  Jorge Ny, [facility] Desk#: [phone] Cell#: [phone]

## 2019-03-19 ENCOUNTER — Ambulatory Visit (INDEPENDENT_AMBULATORY_CARE_PROVIDER_SITE_OTHER): Payer: Medicaid Other

## 2019-03-19 ENCOUNTER — Other Ambulatory Visit: Payer: Self-pay

## 2019-03-19 DIAGNOSIS — Z95810 Presence of automatic (implantable) cardiac defibrillator: Secondary | ICD-10-CM

## 2019-03-19 DIAGNOSIS — I509 Heart failure, unspecified: Secondary | ICD-10-CM

## 2019-03-19 NOTE — Progress Notes (Signed)
EPIC Encounter for ICM Monitoring  Patient Name: Brittany [name] is a 58 y.o. female Date: 03/19/2019 Primary Care Physican: Scifres, [name], PA-C Primary Cardiologist:Bensimhon Electrophysiologist:Allred Last Weight:101 lbs (baseline)  03/11/2019 Weight:103 lbs 03/19/2019 Weight: 102 lbs   Heart Failure questions reviewed.  She reported after taking extra Torsemide x 2 days the weight returned to baseline but today she gained a pound of night.   Hospitalized 3/4 - 3/6 for heart attack.     Report: Thoracic impedance continues to be abnormalafter taking extra Torsemide x 2 days (3/30-4/1) suggesting fluid accumulation since 03/03/2019.   Prescribed: Torsemide20 mgTake 3 tablets (60 mg total) by mouth daily. Take additional as directed by HF team.  Labs: 02/24/2019 Creatinine 1.04, BUN 16, Potassium 3.6, Sodium 141, GFR 59->60 02/14/2019 Creatinine 1.10, BUN 30, Potassium 4.1, Sodium 136, GFR 55->60  02/13/2019 Creatinine 1.02, BUN 21, Potassium 3.8, Sodium 137, GFR >60  02/12/2019 Creatinine 1.26, BUN 22, Potassium 3.4, Sodium 140, GFR 47-54  11/26/2018 Creatinine1.02, BUN24, Potassium3.8, [id number], GFR>60  11/25/2018 Creatinine1.31, BUN26, Potassium3.3, Sodium139, [id number]  11/24/2018 Creatinine0.92, BUN12, Potassium3.6, Sodium141, GFR>60  11/15/2018 Creatinine1.00, BUN17, Potassium3.7, Sodium140, GFR>60 A complete set of results can be found in Results Review.  Recommendations:Advised will sent copy to Dr [name] so he can provide recommendations at the 4/9 virtual visit.   Follow-up plan: ICM clinic phone appointment on4/14/2020 (manual send) to recheck fluid symptoms. Patient scheduled virtual office visit 03/20/2019 with Dr [name].  Copy of ICM check sent to Dr.Allred and Dr [name].  3 month ICM trend: 03/19/2019    1 Year ICM trend:       [name], RN 03/19/2019 11:17 AM

## 2019-03-20 ENCOUNTER — Ambulatory Visit (HOSPITAL_COMMUNITY)
Admission: RE | Admit: 2019-03-20 | Discharge: 2019-03-20 | Disposition: A | Discharge status: Home / Self Care | Payer: BLUE CROSS/BLUE SHIELD | Source: Ambulatory Visit | Attending: Internal Medicine | Admitting: Internal Medicine

## 2019-03-20 ENCOUNTER — Other Ambulatory Visit: Payer: Self-pay

## 2019-03-20 DIAGNOSIS — I2510 Atherosclerotic heart disease of native coronary artery without angina pectoris: Secondary | ICD-10-CM

## 2019-03-20 DIAGNOSIS — I5022 Chronic systolic (congestive) heart failure: Secondary | ICD-10-CM | POA: Diagnosis not present

## 2019-03-20 DIAGNOSIS — I480 Paroxysmal atrial fibrillation: Secondary | ICD-10-CM

## 2019-03-20 MED ORDER — IVABRADINE HCL 7.5 MG PO TABS: 11.2500 mg | ORAL_TABLET | Freq: Two times a day (BID) | ORAL | 3 refills | Status: DC

## 2019-03-20 MED ORDER — TORSEMIDE 20 MG PO TABS: 60.0000 mg | ORAL_TABLET | Freq: Every day | ORAL | 3 refills | Status: DC

## 2019-03-20 NOTE — Progress Notes (Signed)
Heart Failure TeleHealth Note  Due to national recommendations of social distancing due to [place] 19, Audio/video telehealth visit is felt to be most appropriate for this patient at this time.  See MyChart message from today for patient consent regarding telehealth for [facility].  Date:  03/20/2019   ID:  Brittany [name], DOB 12/17/1960, MRN [id number]  Location: Home  Provider location: [facility] Type of Visit: Established patient  PCP:  Scifres, [name], PA-C  Cardiologist:  Glori Bickers, MD Primary HF: [name]  Chief Complaint: Heart Failure follow-up   History of Present Illness:  Brittany [name] is a 58 y.o. 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     Admitted 02/03/2018 with NSTEMI and shock. Underwent emergent cath 02/03/18 showed LAD 100% stenosed, LCx 95% stenosed. Taken for emergent CABG 02/03/18. Required impella post op. Hospital course complicated by cardiogenic shock, HCAP, A fib, respiratory failure, and swallowing issues. She was discharged to SNF. Discharge weight 103 pounds.   In 2019 had multiple hospitalizations for HF and pleural effusion. Underwent pleurodesis at [facility].  Admitted 3/4 - 02/14/2019 with NSTEMI and HF. Received DES to ostial ramus into distal left main based on cath below and underwent diuresis. Meds adjusted as tolerated. Echo with EF 25-30%  At last visit was stable. Struggling with anxiety and depression. Started on zoloft 50 and given Xanax 10 tabs to use prn. Earlier this week remote ICD interrogation showed elevated volume. She took extra torsemide for 2 days and volume improved but now trending back up   Says she is doing  "great". Breathing much better. Very careful with social distancing restrictions. Breathing much better. No CP. Denies edema, orthopnea or PND. Gets meds and food delivered. Weighing herself every morning and weight stable at 101. Taking torsemide 60 daily but was instructed to take 70 daily. Denies dizziness or orthostasis. Gets tired when she takes the extra half of torsemide. Compliant DAPT without Brilinta.   Echo 02/12/19: EF 25-30%, RV mildly reduced  LHC 02/12/19:  Ost LAD to Prox LAD lesion is 100% stenosed.  Ost Cx to Prox Cx lesion is 100% stenosed.  Ost Ramus lesion is 99% stenosed.  Post intervention, there is a 0% residual stenosis.  A drug-eluting stent was successfully placed using a [place] [id number].  SVG and is normal in caliber.  The graft exhibits no disease.  Ost 1st Diag lesion is 70% stenosed.  SVG and is normal in caliber.  Prox Graft lesion is 100% stenosed. 1. Significant underlying two-vessel coronary artery disease with patent SVG to OM and SVG to diagonal which supplies the LAD territory. Atretic LIMA to LAD given competitive flow from SVG to diagonal. Severe ostial ramus artery stenosis not supplied by bypass with his area suggestive of plaque rupture. 2. Severely elevated left ventricular end-diastolic pressure 34 mmHg. Left ventricular angiography was not performed. 3. Successful angioplasty and drug-eluting stent placement to the ostial ramus extending into the distal left main.  Brittany [name] denies symptoms worrisome for COVID 19.   Past Medical History:  Diagnosis Date   Acute systolic congestive heart failure ([place]) 02/03/2018   Allergy    Anxiety    Asthma    Hypertension  PAF (paroxysmal atrial fibrillation) (HCC)    Prophylactic measure 08/03/14-08/19/14   Prophyl. 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   LIMA to LAD, SVG to D1, SVG to OM1, EVH via right thigh with implantation of  Impella LD LVAD via direct aortic approach   Small cell lung cancer ([place]) 03/16/2014   Past Surgical History:  Procedure Laterality Date   CARDIAC DEFIBRILLATOR PLACEMENT  08/15/2018   MDT Visia AF MRI VR ICD implanted by Dr Loel Lofty for primary prevention of sudden   CESAREAN SECTION     CORONARY ARTERY BYPASS GRAFT N/A 02/03/2018   Procedure: CORONARY ARTERY BYPASS GRAFTING (CABG);  Surgeon: Rexene Alberts, MD;  Location: [place];  Service: Open Heart Surgery;  Laterality: N/A;  Time 3 using left internal mammary artery and endoscopically harvested right saphenous vein   CORONARY BALLOON ANGIOPLASTY N/A 02/03/2018   Procedure: CORONARY BALLOON ANGIOPLASTY;  Surgeon: Martinique, Peter M, MD;  Location: [place] [facility];  Service: Cardiovascular;  Laterality: N/A;   CORONARY STENT INTERVENTION N/A 02/12/2019   Procedure: CORONARY STENT INTERVENTION;  Surgeon: Wellington Hampshire, MD;  Location: [place] [facility];  Service: Cardiovascular;  Laterality: N/A;   CORONARY/GRAFT ACUTE MI REVASCULARIZATION N/A 02/03/2018   Procedure: Coronary/Graft Acute MI Revascularization;  Surgeon: Martinique, Peter M, MD;  Location: [place] [facility];  Service: Cardiovascular;  Laterality: N/A;   IABP INSERTION N/A 02/03/2018   Procedure: IABP Insertion;  Surgeon: Martinique, Peter M, MD;  Location: [place] [facility];  Service: Cardiovascular;  Laterality: N/A;   INTRAOPERATIVE TRANSESOPHAGEAL ECHOCARDIOGRAM N/A 02/03/2018   Procedure: INTRAOPERATIVE TRANSESOPHAGEAL ECHOCARDIOGRAM;  Surgeon: Rexene Alberts, MD;  Location: [place];  Service: Open Heart Surgery;  Laterality: N/A;   LEFT HEART CATH AND CORONARY ANGIOGRAPHY N/A 02/03/2018   Procedure: LEFT HEART CATH AND CORONARY ANGIOGRAPHY;  Surgeon: Martinique, Peter M, MD;  Location: [place] [facility];  Service: Cardiovascular;  Laterality: N/A;   LEFT HEART CATH AND CORS/GRAFTS ANGIOGRAPHY N/A 02/12/2019   Procedure: LEFT HEART CATH AND CORS/GRAFTS ANGIOGRAPHY;  Surgeon:  Wellington Hampshire, MD;  Location: [place] [facility];  Service: Cardiovascular;  Laterality: N/A;   MEDIASTINOSCOPY N/A 03/11/2014   Procedure: MEDIASTINOSCOPY;  Surgeon: Melrose Nakayama, MD;  Location: [place];  Service: Thoracic;  Laterality: N/A;   PLACEMENT OF IMPELLA LEFT VENTRICULAR ASSIST DEVICE  02/03/2018   Procedure: PLACEMENT OF [place] LEFT VENTRICULAR ASSIST DEVICE LD;  Surgeon: Rexene Alberts, MD;  Location: [place];  Service: Open Heart Surgery;;   REMOVAL OF [place] LEFT VENTRICULAR ASSIST DEVICE N/A 02/08/2018   Procedure: REMOVAL OF [place] LEFT VENTRICULAR ASSIST DEVICE;  Surgeon: Rexene Alberts, MD;  Location: [place];  Service: Open Heart Surgery;  Laterality: N/A;   RIGHT HEART CATH N/A 02/03/2018   Procedure: RIGHT HEART CATH;  Surgeon: Martinique, Peter M, MD;  Location: [place] [facility];  Service: Cardiovascular;  Laterality: N/A;   RIGHT HEART CATH N/A 05/09/2018   Procedure: RIGHT HEART CATH;  Surgeon: Jolaine Artist, MD;  Location: [place] [facility];  Service: Cardiovascular;  Laterality: N/A;   TEE WITHOUT CARDIOVERSION N/A 02/08/2018   Procedure: TRANSESOPHAGEAL ECHOCARDIOGRAM (TEE);  Surgeon: Rexene Alberts, MD;  Location: [facility];  Service: Open Heart Surgery;  Laterality: N/A;   TUBAL LIGATION     VIDEO BRONCHOSCOPY WITH ENDOBRONCHIAL ULTRASOUND N/A 03/11/2014   Procedure: VIDEO BRONCHOSCOPY WITH ENDOBRONCHIAL ULTRASOUND;  Surgeon: Melrose Nakayama, MD;  Location: [facility];  Service: Thoracic;  Laterality: N/A;     Current Outpatient Medications  Medication Sig Dispense Refill   albuterol (PROVENTIL HFA;VENTOLIN HFA) 108 (90 Base) MCG/ACT inhaler Inhale 2 puffs into the lungs every 6 (six) hours as needed for wheezing or shortness of breath. 1 Inhaler 0   ALPRAZolam (XANAX) 0.25 MG tablet Take 1 tablet (0.25 mg total) by mouth daily as needed for anxiety. 10 tablet 0   aspirin 81 MG EC tablet Take 1 tablet (81 mg total) by mouth daily. 30 tablet 1    atorvastatin (LIPITOR) 80 MG tablet Take 1 tablet (80 mg total) by mouth daily at 6 PM. 30 tablet 1   ipratropium-albuterol (DUONEB) 0.5-2.5 (3) MG/3ML SOLN Take 3 mLs by nebulization 2 (two) times daily. (Patient taking differently: Take 3 mLs by nebulization daily as needed (SOB). ) 360 mL 0   ivabradine (CORLANOR) 7.5 MG TABS tablet Take 1.5 tablets (11.25 mg total) by mouth 2 (two) times daily with a meal. 45 tablet 3   nitroGLYCERIN (NITROSTAT) 0.4 MG SL tablet Place 1 tablet (0.4 mg total) under the tongue every 5 (five) minutes as needed for chest pain. 90 tablet 5   oxyCODONE (OXY IR/ROXICODONE) 5 MG immediate release tablet Take 5 mg by mouth every 6 (six) hours as needed for moderate pain.   0   potassium chloride SA (K-DUR,KLOR-CON) 20 MEQ tablet Take 1 tablet (20 mEq total) by mouth daily. 90 tablet 3   sertraline (ZOLOFT) 50 MG tablet Take 1 tablet (50 mg total) by mouth daily. 30 tablet 2   spironolactone (ALDACTONE) 25 MG tablet Take 1 tablet (25 mg total) by mouth daily. 30 tablet 1   ticagrelor (BRILINTA) 90 MG TABS tablet Take 1 tablet (90 mg total) by mouth 2 (two) times daily. 60 tablet 5   torsemide (DEMADEX) 20 MG tablet Take 3 tablets (60 mg total) by mouth daily. Take additional as directed by HF team. 110 tablet 6   No current facility-administered medications for this encounter.     Allergies:   Codeine   Social History:  The patient  reports that she quit smoking about 5 years ago. Her smoking use included cigarettes. She has a 20.00 pack-year smoking history. She has never used smokeless tobacco. She reports current alcohol use of about 8.0 standard drinks of alcohol per week. She reports that she does not use drugs.   Family History:  The patient's family history includes Cancer in her maternal grandmother; Diabetes in her paternal grandmother; Heart attack in her mother; Heart disease in her mother; Hypertension in her maternal grandmother and mother.   ROS:   Please see the history of present illness.   All other systems are personally reviewed and negative.   Exam:  (Video/Tele Health Call; Exam is subjective and or/visual.) General:  Speaks in full sentences. No resp difficulty. Lungs: Normal respiratory effort with conversation.  Abdomen: Non-distended per patient report Extremities: Pt denies edema. Neuro: Alert & oriented x 3.   Recent Labs: 04/20/2018: TSH 0.766 02/12/2019: ALT 15; Magnesium 2.0 02/24/2019: B Natriuretic Peptide 1,152.0; BUN 16; Creatinine, Ser 1.04; Hemoglobin 15.2; Platelets 293; Potassium 3.6; Sodium 141  Personally reviewed   Wt Readings from Last 3 Encounters:  02/24/19 47.4 kg (104 lb 9.6 oz)  02/14/19 46.2 kg (101 lb 14.4 oz)  01/14/19 44.9 kg (99 lb)      ASSESSMENT AND PLAN:  1. Chronic systolic HF due to ICM. S/p Medtronic single chamber  ICD. -Echo 09/04/18 ([facility]) LVEF 20%, Moderate AI, Mild MR, Mild TR, Severe LAE, RV mildly decreased.  - Echo 02/12/19: EF 25-30%, RV mildly reduced - Stable NYHA III, - Volume status elevated by remote ICD interrogation  - Continue torsemide 60 mg daily. Will increase to 70mg  MWF - Continue spiro 25 mg qHS - Continue corlanor 7.5 mg BID - She is followed by HF paramedicine in the community. - Repeat ICD interrogation next Tuesday - Labs 2 weeks  2. CAD  - Hx of NSTEMI/STEMI s/p Emergent CABG 02/03/18: Cath with severe 2v CAD as above with severe LV dysfunction/ICM. s/p Emergent CABG 02/03/18. - Had NSTEMI and now s/p LHC 02/12/19 with DES to the ostial ramus extending into the distal left main - She will need DAPT for at least 1 year. On ASA and Brilinta. Price for Brilinta is $3.90 - No s/s of ischemia.    - Encouraged CR when COVID crisis is over.  3. Recurrent pleural effusions s/p pleurodesis - Trace pleural effusion on right on CXR on recent admit. Marland Kitchen   4. Chronic hypoxic respiratory failure - Wears O2 PRN at home. Stable.   5. PAF - CHA2DS2/VASc is at  least 4. (CHF, Vasc disease, HTN, Female).  - She has history of Afib RVR in the past in the setting of her Lung CA. Previously on Xarelto but stopped.  - Continue ASA and brilinta.  - No afib by recent ICD interrogation. Continue corlanor.   6. 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  - Chest CT 02/19/18 with mass-like consolidation in R hilum concerning for recurrent tumor.  -Repeat CT 03/27/2018 -No definite findings of locally recurrent tumor in the right lung. 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 Continued chest CT surveillance is advised in 3-6 months. - S/p thoracentesis 04/2018. Cytology negative for malignancy.  - CT chest 09/23/18 with slight increase in size of loculated right hydropneumothorax, status post thoracostomy tube removal. No evidence of tumor noted.  - No change  7. Anxiety/Depression - Improved. Continue zoloft 50 daily   COVID screen The patient does not have any symptoms that suggest any further testing/ screening at this time.  Social distancing reinforced today.  Recommended follow-up:  As above  Relevant cardiac medications were reviewed at length with the patient today.   The patient does not have concerns regarding their medications at this time.   The following changes were made today:  As above  Today, I have spent 12 minutes with the patient with telehealth technology discussing the above issues .    Signed, Glori Bickers, MD  03/20/2019 10:58 AM  Advanced Heart Failure [place] [address] [phone] (office) [phone] (fax)

## 2019-03-20 NOTE — Patient Instructions (Addendum)
CONTINUE Torsemide 60mg  (3 tabs) on every day EXCEPT Monday Wednesday AND Friday. ON MWF ONLY take 70mg  (3.5 tabs)   Lab work will need to be done in 2 weeks.   Please follow up with Dr. [name] in 1 month with a telehealth visit.

## 2019-03-20 NOTE — Addendum Note (Signed)
Encounter addended by: Marlise Eves, RN on: 03/20/2019 1:27 PM  Actions taken: Diagnosis association updated, Order list changed, Clinical Note Signed

## 2019-03-20 NOTE — Progress Notes (Signed)
Spoke to patient to review the after visit summary instructions. Pt verbalized understanding of medication changes. Pt to be called back to schedule follow up visit and lab work. Refilled ivabradine. Pt denies any other needs.

## 2019-03-21 NOTE — Progress Notes (Signed)
Dr [name] advised at 03/20/2019 virtual visit to continue torsemide 60 mg daily. Will increase to 70mg  MWF.  Recheck fluid levels 03/25/2019.

## 2019-03-24 ENCOUNTER — Telehealth (HOSPITAL_COMMUNITY): Payer: Self-pay

## 2019-03-24 ENCOUNTER — Encounter (HOSPITAL_COMMUNITY): Payer: BLUE CROSS/BLUE SHIELD | Admitting: Internal Medicine

## 2019-03-24 NOTE — Telephone Encounter (Signed)
Spoke to Brittany [name], she states she is doing well and has no complaints. She stated she has been in touch with all of her doctors and is up to date on her medications. She did not want to schedule a home visit at present. I reported D. Copper would follow up upon her return to work.

## 2019-03-25 ENCOUNTER — Telehealth (HOSPITAL_COMMUNITY): Payer: Self-pay

## 2019-03-25 ENCOUNTER — Other Ambulatory Visit: Payer: Self-pay

## 2019-03-25 ENCOUNTER — Ambulatory Visit (INDEPENDENT_AMBULATORY_CARE_PROVIDER_SITE_OTHER): Payer: Medicaid Other

## 2019-03-25 DIAGNOSIS — Z95810 Presence of automatic (implantable) cardiac defibrillator: Secondary | ICD-10-CM

## 2019-03-25 DIAGNOSIS — I5022 Chronic systolic (congestive) heart failure: Secondary | ICD-10-CM

## 2019-03-25 NOTE — Telephone Encounter (Signed)
Received fax from pharmacy, [place] not covered for >2 pills a day.  Called [facility], PA needed.  Attempted to do PA, not going through. Plan to contact insurance plan directly

## 2019-03-26 NOTE — Telephone Encounter (Signed)
Called insurance to complete PA for Corlanor, unable to get through to representative.  Will make another attempt tomorrow.

## 2019-03-28 ENCOUNTER — Telehealth: Payer: Self-pay

## 2019-03-28 NOTE — Progress Notes (Signed)
EPIC Encounter for ICM Monitoring  Patient Name: Brittany [name] is a 58 y.o. female Date: 03/28/2019 Primary Care Physican: Maude Leriche, PA-C Primary Cardiologist:Bensimhon Electrophysiologist:Allred Last Weight:101 lbs(baseline)  3/31/2020Weight:103lbs 03/19/2019 Weight: 102 lbs 03/28/2019 Weight: 101 lbs (baseline)   Heart Failure questions reviewed.  She reports she is feeling fine.    Report: Thoracic impedance returned normal.   Prescribed: Torsemide20 mgTake 3 tablets (60 mg total) by mouth daily and 70mg  MWF which was increased from 60 mg on 4/9.   Labs: 02/24/2019 Creatinine1.04, BUN16, Potassium3.6, Sodium141, GFR59->60, BNP 1,152.0 02/14/2019 Creatinine1.10, BUN30, Potassium4.1, Sodium136, GFR55->60  02/13/2019 Creatinine1.02, BUN21, Potassium3.8, [id number], GFR>60  02/12/2019 Creatinine1.26, BUN22, Potassium3.4, [id number], [id number] 11/26/2018 Creatinine1.02, BUN24, Potassium3.8, Sodium139, GFR>60  11/25/2018 Creatinine1.31, BUN26, Potassium3.3, Sodium139, [id number]  11/24/2018 Creatinine0.92, BUN12, Potassium3.6, Sodium141, GFR>60  11/15/2018 Creatinine1.00, BUN17, Potassium3.7, Sodium140, GFR>60 A complete set of results can be found in Results Review.  Recommendations:No changes and advise to call for any fluid symptoms.   Follow-up plan: ICM clinic phone appointment on5/18/2020.  Copy of ICM check sent to Dr.Allred.   3 month ICM trend: 03/28/2019    1 Year ICM trend:       [name], RN 03/28/2019 10:44 AM

## 2019-03-28 NOTE — Telephone Encounter (Signed)
Spoke with patient to remind of missed remote transmission 

## 2019-04-01 NOTE — Telephone Encounter (Signed)
Multiple attempts to call insurance unsuccesfull to complete PA for Corlanor.  Calls are transferred multiple times and placed on hold for 45 minutes without answer.

## 2019-04-04 ENCOUNTER — Ambulatory Visit (INDEPENDENT_AMBULATORY_CARE_PROVIDER_SITE_OTHER): Payer: BLUE CROSS/BLUE SHIELD | Admitting: *Deleted

## 2019-04-04 ENCOUNTER — Other Ambulatory Visit: Payer: Self-pay

## 2019-04-04 DIAGNOSIS — Z95810 Presence of automatic (implantable) cardiac defibrillator: Secondary | ICD-10-CM

## 2019-04-04 DIAGNOSIS — I5022 Chronic systolic (congestive) heart failure: Secondary | ICD-10-CM

## 2019-04-04 LAB — CUP PACEART REMOTE DEVICE CHECK
Battery Remaining Longevity: 133 mo
Battery Voltage: 3.06 V
Brady Statistic RV Percent Paced: 0.01 %
Date Time Interrogation Session: 20200424083823
HighPow Impedance: 61 Ohm
Implantable Lead Implant Date: 20190905
Implantable Lead Location: 753860
Implantable Pulse Generator Implant Date: 20190905
Lead Channel Impedance Value: 342 Ohm
Lead Channel Impedance Value: 456 Ohm
Lead Channel Pacing Threshold Amplitude: 0.875 V
Lead Channel Pacing Threshold Pulse Width: 0.4 ms
Lead Channel Sensing Intrinsic Amplitude: 17.875 mV
Lead Channel Sensing Intrinsic Amplitude: 17.875 mV
Lead Channel Setting Pacing Amplitude: 2 V
Lead Channel Setting Pacing Pulse Width: 0.4 ms
Lead Channel Setting Sensing Sensitivity: 0.3 mV

## 2019-04-07 ENCOUNTER — Encounter: Payer: BLUE CROSS/BLUE SHIELD | Admitting: Thoracic Surgery (Cardiothoracic Vascular Surgery)

## 2019-04-10 ENCOUNTER — Telehealth (HOSPITAL_COMMUNITY): Payer: Self-pay | Admitting: *Deleted

## 2019-04-10 NOTE — Telephone Encounter (Signed)
Called and spoke to pt in regards Cardiac Rehab continued closure due to adherence of national recommendation for Covid-19 in group setting. Pt verbalized understanding.  Pt current exercise routine includes ambulating up and down steps and walking in her neighborhood.  Will send pt additional handouts on warm up/cool down stretches along with handweights. Pt has 3 pound handweights she can use.  Pt desires to gain weight. Will also send pt tips on weight gain.  Pt very appreciative and is looking forward to CR when we are permitted to schedule. Cherre Huger, BSN Cardiac and Training and development officer

## 2019-04-11 ENCOUNTER — Encounter: Payer: Self-pay | Admitting: Cardiology

## 2019-04-11 NOTE — Progress Notes (Signed)
Remote ICD transmission.   

## 2019-04-16 ENCOUNTER — Other Ambulatory Visit (HOSPITAL_COMMUNITY): Payer: Self-pay

## 2019-04-16 MED ORDER — ALBUTEROL SULFATE HFA 108 (90 BASE) MCG/ACT IN AERS: 2.0000 | INHALATION_SPRAY | Freq: Four times a day (QID) | RESPIRATORY_TRACT | 0 refills | Status: DC | PRN

## 2019-04-17 ENCOUNTER — Telehealth (HOSPITAL_COMMUNITY): Payer: Self-pay

## 2019-04-17 NOTE — Telephone Encounter (Signed)
Received message from patient that she is at a different address than what is listed.  She is planned for blood work Architectural technologist. Called her back, new address taken and given to RN Vanita Ingles.  Pt appreciative.

## 2019-04-18 ENCOUNTER — Telehealth (HOSPITAL_COMMUNITY): Payer: Self-pay | Admitting: Adult Health

## 2019-04-18 ENCOUNTER — Other Ambulatory Visit: Payer: Self-pay

## 2019-04-18 ENCOUNTER — Ambulatory Visit (HOSPITAL_COMMUNITY)
Admission: RE | Admit: 2019-04-18 | Discharge: 2019-04-18 | Disposition: A | Discharge status: Home / Self Care | Payer: BLUE CROSS/BLUE SHIELD | Source: Ambulatory Visit | Attending: Cardiology | Admitting: Cardiology

## 2019-04-18 DIAGNOSIS — I5022 Chronic systolic (congestive) heart failure: Secondary | ICD-10-CM

## 2019-04-18 NOTE — Telephone Encounter (Signed)
error 

## 2019-04-18 NOTE — Telephone Encounter (Signed)
Today she is SOB with exertion. Denies fever or chills.   Weight has gone up form 104-->106 pounds.   Says she has had bacon and potatoe chips but she tires to limit to once a week.   Reds Reading 35%.  SBP 99  O2 sats 97% on room air.   Instructed to take an extra 10 mg of torsemide for 2 days then resume current diuretic regimen.    She has follow up with dr [name] next week. Instructed to call back if shortness of breath does not improve.   Logan Baltimore NPC-  1:30 PM

## 2019-04-18 NOTE — Telephone Encounter (Signed)
   Received call from [place].

## 2019-04-21 ENCOUNTER — Ambulatory Visit (HOSPITAL_COMMUNITY)
Admission: RE | Admit: 2019-04-21 | Discharge: 2019-04-21 | Disposition: A | Discharge status: Home / Self Care | Payer: BLUE CROSS/BLUE SHIELD | Source: Ambulatory Visit | Attending: Cardiology | Admitting: Cardiology

## 2019-04-21 ENCOUNTER — Other Ambulatory Visit: Payer: Self-pay

## 2019-04-21 DIAGNOSIS — I509 Heart failure, unspecified: Secondary | ICD-10-CM | POA: Diagnosis present

## 2019-04-21 LAB — BASIC METABOLIC PANEL
Anion gap: 14 (ref 5–15)
BUN: 19 mg/dL (ref 6–20)
CO2: 29 mmol/L (ref 22–32)
Calcium: 10.1 mg/dL (ref 8.9–10.3)
Chloride: 98 mmol/L (ref 98–111)
Creatinine, Ser: 1.47 mg/dL — ABNORMAL HIGH (ref 0.44–1.00)
GFR calc Af Amer: 45 mL/min — ABNORMAL LOW (ref >60)
GFR calc non Af Amer: 39 mL/min — ABNORMAL LOW (ref >60)
Glucose, Bld: 103 mg/dL — ABNORMAL HIGH (ref 70–99)
Potassium: 3.5 mmol/L (ref 3.5–5.1)
Sodium: 141 mmol/L (ref 135–145)

## 2019-04-21 NOTE — Progress Notes (Signed)
[name]       DOB: [birth year]  Purpose of Visit: BMET on 04/18/19 HF provider:  Medications: Is the patient taking all medications listed on MAR from Epic? Yes  List any medications that are not being taken correctly: n/a  List any medication refills needed:  Is the patient able to pick up medications? Yes  Vitals: BP:  95/p, 66, 20, 97%RA     Weight: 106lb       Physical Exam:  Lung sounds: wheezing  Heart sounds: regular  Peripheral edema: yes, UE's > LE's  Wounds: no  Location:  Any patient concerns? Her wheezing, fatigue and shob w/exertion  ReDS Vest/Clip Reading: 35%   Rhythm Strip: n/a  Facetime w/Amy, NP at the HF clinic to discuss pt's results w/ her.  Extra torsemide (3.5 tabs) on Sat and Sunday.  She will have a virtual visit w/Dr. [name] on Friday 04/25/19. She knows she can call the clinic w/any questions and/or concerns.  Is Home Health recommended? Yes/No If yes, state reason:   Vanita Ingles, RN 04/21/19

## 2019-04-21 NOTE — Progress Notes (Signed)
Home visit for redraw of BMET.  Also offered [facility] home meal delivery as she is staying with a friend and she said it would be very helpful.  Will get this arranged for her.  Blood tube brought back to the HF clinic and sent to the lab.

## 2019-04-22 ENCOUNTER — Telehealth (HOSPITAL_COMMUNITY): Payer: Self-pay | Admitting: Licensed Clinical Social Worker

## 2019-04-22 NOTE — Telephone Encounter (Signed)
CSW referred to contact patient to inquire about food insecurity during this health crisis. Patient reported need and agreeable to weekly delivery from [employer]. Patient informed that delivery will be left on front door with no face to face contact with delivery person. Patient agreeable to plan and grateful for the assistance. Raquel Sarna, [place], [place]

## 2019-04-25 ENCOUNTER — Encounter (HOSPITAL_COMMUNITY): Payer: Self-pay | Admitting: *Deleted

## 2019-04-25 ENCOUNTER — Ambulatory Visit (HOSPITAL_COMMUNITY)
Admission: RE | Admit: 2019-04-25 | Discharge: 2019-04-25 | Disposition: A | Discharge status: Home / Self Care | Payer: BLUE CROSS/BLUE SHIELD | Source: Ambulatory Visit | Attending: Internal Medicine | Admitting: Internal Medicine

## 2019-04-25 ENCOUNTER — Other Ambulatory Visit: Payer: Self-pay

## 2019-04-25 VITALS — Wt 104.0 lb

## 2019-04-25 DIAGNOSIS — J9611 Chronic respiratory failure with hypoxia: Secondary | ICD-10-CM | POA: Diagnosis not present

## 2019-04-25 DIAGNOSIS — I5022 Chronic systolic (congestive) heart failure: Secondary | ICD-10-CM | POA: Diagnosis not present

## 2019-04-25 DIAGNOSIS — I2510 Atherosclerotic heart disease of native coronary artery without angina pectoris: Secondary | ICD-10-CM

## 2019-04-25 DIAGNOSIS — I480 Paroxysmal atrial fibrillation: Secondary | ICD-10-CM

## 2019-04-25 MED ORDER — SPIRONOLACTONE 25 MG PO TABS: 25.0000 mg | ORAL_TABLET | Freq: Every day | ORAL | 6 refills | Status: DC

## 2019-04-25 MED ORDER — IVABRADINE HCL 7.5 MG PO TABS: 7.5000 mg | ORAL_TABLET | Freq: Two times a day (BID) | ORAL | 6 refills | Status: DC

## 2019-04-25 NOTE — Patient Instructions (Signed)
Decrease Corlanor to 7.5 mg Twice daily   Refill for your Spironolactone sent to [facility] for you  Our office will contact you to schedule a follow up appointment in 1 month  If you have any questions or concerns before your next appointment please send [place] a message through [place] or call our office at [phone].

## 2019-04-25 NOTE — Addendum Note (Signed)
Encounter addended by: Georgiana Shore, NP on: 04/25/2019 2:41 PM  Actions taken: Clinical Note Signed

## 2019-04-25 NOTE — Progress Notes (Addendum)
Remote transmission requested by Lillia Mountain, NP Heart Failure clinic for follow up after virtual visit today. Routed to [place] for review.   Optivol 3 month     1 year

## 2019-04-25 NOTE — Progress Notes (Signed)
AVS sent to pt via mychart. 

## 2019-04-25 NOTE — Progress Notes (Signed)
Thoracic impedence is elevated, indicating that she is dry. She can hold torsemide x 1 day, then resume previous dosing. Thanks

## 2019-04-25 NOTE — Progress Notes (Signed)
Spoke w/pt, she is aware, agreeable and verbalizes understanding.

## 2019-04-25 NOTE — Progress Notes (Addendum)
Heart Failure TeleHealth Note  Due to national recommendations of social distancing due to [name] 19, telehealth visit is felt to be most appropriate for this patient at this time.  I discussed the limitations, risks, security and privacy concerns of performing an evaluation and management service by telephone and the availability of in person appointments. I also discussed with the patient that there may be a patient responsible charge related to this service. The patient expressed understanding and agreed to proceed.   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     Admitted 02/03/2018 with NSTEMI and shock. Underwent emergent cath 02/03/18 showed LAD 100% stenosed, LCx 95% stenosed. Taken for emergent CABG 02/03/18. Required impella post op. Hospital course complicated by cardiogenic shock, HCAP, A fib, respiratory failure, and swallowing issues. She was discharged to SNF. Discharge weight 103 pounds.   In 2019 had multiple hospitalizations for HF and pleural effusion. Underwent pleurodesis at [facility].  Admitted 3/4 - 02/14/2019 with NSTEMI and HF. Received DES to ostial ramus into distal left main based on cath below and underwent diuresis. Meds adjusted as tolerated. Echo with EF 25-30%  Patient presents via audio conferencing for a telehealth visit today. Last visit she was told  to increase torsemide to 70 mg MWF. She had a ReDS check on 5/8 with reading of 35%, but weight was up 2 lbs and she was SOB. Advised to take additional 10 mg torsemide x 2 days. Labs earlier this week showed creatinine up at 1.47, K 3.5. Overall doing okay. SOB is okay unless she goes fast. None with walking on flat ground if she goes slowly. Has some mild edema in hands, very little in feet. No bloating. No further orthopnea or PND. Appetite poor. Has some pain along sternum at surgical site. No angina or dizziness. Weight is 104 lbs today. UOP up and down on torsemide. Limiting fluid to 2L daily. Limiting salt intake. Getting meals delivered and eating healthier now. Doing CR home exercises for now until CR reopens. Living with a friend currently. SBP 90s. She does not check HR. Wearing O2 PRN.   Pt denies symptoms of cough, fevers, chills, or new SOB worrisome for COVID 19.    Echo 02/12/19: EF 25-30%, RV mildly reduced  LHC 02/12/19:  Ost LAD to Prox LAD lesion is 100% stenosed.  Ost Cx to Prox Cx lesion is 100% stenosed.  Ost Ramus lesion is 99% stenosed.  Post intervention, there is a 0% residual stenosis.  A drug-eluting stent was successfully placed using a [place] [id number].  SVG and is normal in caliber.  The graft exhibits no disease.  Ost 1st Diag lesion is 70% stenosed.  SVG and is normal in caliber.  Prox Graft lesion is 100% stenosed. 1. Significant underlying two-vessel coronary artery disease with patent SVG to OM and SVG to diagonal which supplies the LAD territory. Atretic LIMA to LAD given  competitive flow from SVG to diagonal. Severe ostial ramus artery stenosis not supplied by bypass with his area suggestive of plaque rupture. 2. Severely elevated left ventricular end-diastolic pressure 34 mmHg. Left ventricular angiography was not performed. 3. Successful angioplasty and drug-eluting stent placement to the ostial ramus extending into the distal left  main.  Past Medical History:  Diagnosis Date  . Acute systolic congestive heart failure ([place]) 02/03/2018  . Allergy   . Anxiety   . Asthma   . Hypertension   . PAF (paroxysmal atrial fibrillation) ([place])   . Prophylactic measure 08/03/14-08/19/14   Prophyl. cranial radiation 24 Gy  . 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 Small cell lung cancer ([place]) 03/16/2014   Past Surgical History:  Procedure Laterality Date  . CARDIAC DEFIBRILLATOR PLACEMENT  08/15/2018   MDT Visia AF MRI VR ICD implanted by Dr Loel Lofty for primary prevention of sudden  . CESAREAN SECTION    . CORONARY ARTERY BYPASS GRAFT N/A 02/03/2018   Procedure: CORONARY ARTERY BYPASS GRAFTING (CABG);  Surgeon: Rexene Alberts, MD;  Location: [facility];  Service: Open Heart Surgery;  Laterality: N/A;  Time 3 using left internal mammary artery and endoscopically harvested right saphenous vein  . CORONARY BALLOON ANGIOPLASTY N/A 02/03/2018   Procedure: CORONARY BALLOON ANGIOPLASTY;  Surgeon: Martinique, Peter M, MD;  Location: [facility];  Service: Cardiovascular;  Laterality: N/A;  . CORONARY STENT INTERVENTION N/A 02/12/2019   Procedure: CORONARY STENT INTERVENTION;  Surgeon: Wellington Hampshire, MD;  Location: [facility];  Service: Cardiovascular;  Laterality: N/A;  . CORONARY/GRAFT ACUTE MI REVASCULARIZATION N/A 02/03/2018   Procedure: Coronary/Graft Acute MI Revascularization;  Surgeon: Martinique, Peter M, MD;  Location: [facility];  Service: Cardiovascular;  Laterality: N/A;  . IABP INSERTION N/A 02/03/2018   Procedure: IABP Insertion;  Surgeon: Martinique, Peter M, MD;  Location: [facility];  Service: Cardiovascular;  Laterality: N/A;  . INTRAOPERATIVE TRANSESOPHAGEAL ECHOCARDIOGRAM N/A 02/03/2018   Procedure: INTRAOPERATIVE TRANSESOPHAGEAL ECHOCARDIOGRAM;  Surgeon: Rexene Alberts, MD;  Location: [facility];  Service: Open Heart Surgery;  Laterality: N/A;  . LEFT HEART CATH AND CORONARY ANGIOGRAPHY N/A 02/03/2018   Procedure: LEFT HEART CATH AND CORONARY ANGIOGRAPHY;  Surgeon: Martinique, Peter M, MD;  Location: [facility];  Service: Cardiovascular;  Laterality: N/A;  . LEFT HEART CATH AND CORS/GRAFTS ANGIOGRAPHY N/A 02/12/2019   Procedure: LEFT HEART CATH AND CORS/GRAFTS ANGIOGRAPHY;  Surgeon: Wellington Hampshire, MD;  Location: [facility];  Service: Cardiovascular;  Laterality: N/A;  . MEDIASTINOSCOPY N/A 03/11/2014   Procedure: MEDIASTINOSCOPY;  Surgeon: Melrose Nakayama, MD;  Location: [facility];  Service: Thoracic;  Laterality: N/A;  . PLACEMENT OF [place] LEFT VENTRICULAR ASSIST DEVICE  02/03/2018   Procedure: PLACEMENT OF [place] LEFT VENTRICULAR ASSIST DEVICE LD;  Surgeon: Rexene Alberts, MD;  Location: [facility];  Service: Open Heart Surgery;;  . REMOVAL OF IMPELLA LEFT VENTRICULAR ASSIST DEVICE N/A 02/08/2018   Procedure: REMOVAL OF [place] LEFT VENTRICULAR ASSIST DEVICE;  Surgeon: Rexene Alberts, MD;  Location: [facility];  Service: Open Heart Surgery;  Laterality: N/A;  . RIGHT HEART CATH N/A 02/03/2018   Procedure: RIGHT HEART CATH;  Surgeon: Martinique, Peter M, MD;  Location: [facility];  Service: Cardiovascular;  Laterality: N/A;  . RIGHT  HEART CATH N/A 05/09/2018   Procedure: RIGHT HEART CATH;  Surgeon: Jolaine Artist, MD;  Location: [facility];  Service: Cardiovascular;  Laterality: N/A;  . TEE WITHOUT CARDIOVERSION N/A 02/08/2018   Procedure: TRANSESOPHAGEAL ECHOCARDIOGRAM (TEE);  Surgeon: Rexene Alberts, MD;  Location: [facility];  Service: Open Heart Surgery;  Laterality: N/A;  . TUBAL LIGATION    . VIDEO BRONCHOSCOPY WITH ENDOBRONCHIAL ULTRASOUND N/A 03/11/2014   Procedure: VIDEO BRONCHOSCOPY WITH ENDOBRONCHIAL ULTRASOUND;  Surgeon: Melrose Nakayama, MD;  Location: [facility];  Service: Thoracic;  Laterality: N/A;     Current Outpatient Medications  Medication Sig  Dispense Refill  . aspirin 81 MG EC tablet Take 1 tablet (81 mg total) by mouth daily. 30 tablet 1  . atorvastatin (LIPITOR) 80 MG tablet Take 1 tablet (80 mg total) by mouth daily at 6 PM. 30 tablet 1  . ivabradine (CORLANOR) 7.5 MG TABS tablet Take 1.5 tablets (11.25 mg total) by mouth 2 (two) times daily with a meal. 45 tablet 3  . potassium chloride SA (K-DUR,KLOR-CON) 20 MEQ tablet Take 1 tablet (20 mEq total) by mouth daily. 90 tablet 3  . spironolactone (ALDACTONE) 25 MG tablet Take 1 tablet (25 mg total) by mouth daily. 30 tablet 1  . ticagrelor (BRILINTA) 90 MG TABS tablet Take 1 tablet (90 mg total) by mouth 2 (two) times daily. 60 tablet 5  . torsemide (DEMADEX) 20 MG tablet Take 3 tablets (60 mg total) by mouth daily. Take additional as directed by HF team. (take 70 mg (3.5 tabs) on MWF 310 tablet 3  . albuterol (VENTOLIN HFA) 108 (90 Base) MCG/ACT inhaler Inhale 2 puffs into the lungs every 6 (six) hours as needed for wheezing or shortness of breath. 1 Inhaler 0  . ALPRAZolam (XANAX) 0.25 MG tablet Take 1 tablet (0.25 mg total) by mouth daily as needed for anxiety. 10 tablet 0  . ipratropium-albuterol (DUONEB) 0.5-2.5 (3) MG/3ML SOLN Take 3 mLs by nebulization 2 (two) times daily. (Patient taking differently: Take 3 mLs by nebulization daily as needed (SOB). ) 360 mL 0  . nitroGLYCERIN (NITROSTAT) 0.4 MG SL tablet Place 1 tablet (0.4 mg total) under the tongue every 5 (five) minutes as needed for chest pain. 90 tablet 5  . oxyCODONE (OXY IR/ROXICODONE) 5 MG immediate release tablet Take 5 mg by mouth every 6 (six) hours as needed for moderate pain.   0  . sertraline (ZOLOFT) 50 MG tablet Take 1 tablet (50 mg total) by mouth daily. 30 tablet 2   No current facility-administered medications for this encounter.     Allergies:   Codeine   Social History:  The patient  reports that she quit smoking about 5 years ago. Her smoking use included cigarettes. She has a 20.00 pack-year smoking  history. She has never used smokeless tobacco. She reports current alcohol use of about 8.0 standard drinks of alcohol per week. She reports that she does not use drugs.   Family History:  The patient's family history includes Cancer in her maternal grandmother; Diabetes in her paternal grandmother; Heart attack in her mother; Heart disease in her mother; Hypertension in her maternal grandmother and mother.   ROS:  Please see the history of present illness.   All other systems are personally reviewed and negative.    Exam:  (Video/Tele Health Call; Exam is subjective and or/visual.) General:  Speaks in full sentences. No resp difficulty. Lungs: Normal respiratory effort with conversation.  Abdomen: No  distension per patient report Extremities: Pt denies edema. Neuro: Alert & oriented x 3.   Recent Labs: 02/12/2019: ALT 15; Magnesium 2.0 02/24/2019: B Natriuretic Peptide 1,152.0; Hemoglobin 15.2; Platelets 293 04/21/2019: BUN 19; Creatinine, Ser 1.47; Potassium 3.5; Sodium 141  Personally reviewed   Wt Readings from Last 3 Encounters:  04/25/19 47.2 kg (104 lb)  04/18/19 48.1 kg (106 lb)  02/24/19 47.4 kg (104 lb 9.6 oz)      ASSESSMENT AND PLAN:  1.Chronicsystolic HF due to ICM. S/p Medtronic single chamber ICD. -Echo 09/04/18 ([facility]) LVEF 20%, Moderate AI, Mild MR, Mild TR, Severe LAE, RV mildly decreased.  - Echo 02/12/19: EF 25-30%, RV mildly reduced - Stable NYHAIII - Volume status sounds okay, but creatinine elevated on labs earlier this week. I have asked her to send a remote transmission today.  - Continue torsemide 60 mg daily with 70mg  MWF - Continue spiro 25 mg qHS.  - She has been taking corlanor 11.25 mg BID - I wonder if she had the 5 mg tabs at some point and continued taking 1.5 tabs once she got the 7.5 mg tabs. She does not have a way to check her HR. I will decrease her back to 7.5 mg BID.   2. CAD  - Hx of NSTEMI/STEMI s/p Emergent CABG 02/03/18: Cath with severe  2v CAD as above with severe LV dysfunction/ICM. s/p Emergent CABG 02/03/18. - Had NSTEMI and now s/p LHC 02/12/19 with DES to the ostial ramus extending into the distal left main - She will need DAPT for at least 1 year. On ASA and Brilinta.  - No s/s ischemia. - Encouraged CRwhen COVID crisis is over. She has been in contact with them.   3. Recurrent pleural effusions s/p pleurodesis - Trace pleural effusion on right on CXR on recent admit. No change.   4. Chronic hypoxic respiratory failure - Wears O2 PRN at home. Stable.   5. PAF - CHA2DS2/VASc is at least 4. (CHF, Vasc disease, HTN, Female).  - She has history of Afib RVR in the past in the setting of her Lung CA. Previously on Xarelto but stopped.  - Continue ASA and brilinta.  - No afib byrecentICD interrogation. Continue corlanor.   6. 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  - Chest CT 02/19/18 with mass-like consolidation in R hilum concerning for recurrent tumor.  - Repeat CT 03/27/2018 -No definite findings of locally recurrent tumor in the right lung. 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 Continued chest CT surveillance is advised in 3-6 months. - S/p thoracentesis 04/2018. Cytology negative for malignancy.  - CT chest 09/23/18 with slight increase in size of loculated right hydropneumothorax, status post thoracostomy tube removal. No evidence of tumor noted.  7. Anxiety/Depression - Improved. Continue zoloft 50 daily. Per PCP.   8. AKI - Creatinine up to 1.47 on 5/11   COVID screen The patient does not have any symptoms that suggest any further testing/ screening at this time.  Social distancing reinforced today.  Patient Risk: After full review of this patients clinical status, I feel that they are at moderate risk for cardiac decompensation at this time.  Orders/Follow up: Refill spiro 25 mg daily. Decrease corlanor  back to 7.5 mg BID. I have asked her to send in remote transmission. Will address diuretics after I get transmission back given recent AKI.   Today, I have spent 16 minutes with  the patient with telehealth technology discussing the above issues.    Addendum: Received ICD transmission. Thoracic impedence is elevated, indicating that she is dry. She can hold torsemide x 1 day and then resume previous dosing. May have to cut back torsemide if she continues to follow diet.    Signed, Georgiana Shore, NP  04/25/2019 2:20 PM   [facility] [address] Heart and [place] [address] [phone] (office) [phone] (fax)

## 2019-04-25 NOTE — Addendum Note (Signed)
Encounter addended by: Scarlette Calico, RN on: 04/25/2019 2:50 PM  Actions taken: Order list changed, Diagnosis association updated, Clinical Note Signed

## 2019-04-28 ENCOUNTER — Ambulatory Visit (INDEPENDENT_AMBULATORY_CARE_PROVIDER_SITE_OTHER): Payer: BLUE CROSS/BLUE SHIELD

## 2019-04-28 ENCOUNTER — Other Ambulatory Visit: Payer: Self-pay

## 2019-04-28 DIAGNOSIS — I5022 Chronic systolic (congestive) heart failure: Secondary | ICD-10-CM

## 2019-04-28 DIAGNOSIS — Z95810 Presence of automatic (implantable) cardiac defibrillator: Secondary | ICD-10-CM

## 2019-04-30 ENCOUNTER — Telehealth (HOSPITAL_COMMUNITY): Payer: Self-pay | Admitting: Licensed Clinical Social Worker

## 2019-04-30 NOTE — Telephone Encounter (Signed)
CSW contacted patient to follow up on weekly food delivery package. Patient informed of delivery time and no face to face contact during delivery. Message left as no answer.  CSW continues to follow for supportive needs. Jackie Alanda Colton, LCSW, CCSW-MCS [phone] 

## 2019-05-01 NOTE — Progress Notes (Signed)
EPIC Encounter for ICM Monitoring  Patient Name: Brittany [name] is a 58 y.o. female Date: 05/01/2019 Primary Care Physican: Maude Leriche, PA-C Primary Cardiologist:Bensimhon Electrophysiologist:Allred 03/28/2019 Weight: 101 lbs (baseline)   Transmission reviewed and results sent via mychart.  Optivol Thoracic impedancenormal.   Prescribed: Torsemide20 mgTake 3 tablets (60 mg total) by mouth daily and 70mg  MWF.   Labs: 04/21/2019 Creatinine 1.47, BUN 19, Potassium 3.5, Sodium 141, GFR 39-45 02/24/2019 Creatinine1.04, BUN16, Potassium3.6, Sodium141, GFR59->60, BNP 1,152.0 02/14/2019 Creatinine1.10, BUN30, Potassium4.1, Sodium136, GFR55->60  02/13/2019 Creatinine1.02, BUN21, Potassium3.8, Sodium137, GFR>60  02/12/2019 Creatinine1.26, BUN22, Potassium3.4, [id number], [id number] 11/26/2018 Creatinine1.02, BUN24, Potassium3.8, Sodium139, GFR>60  11/25/2018 Creatinine1.31, BUN26, Potassium3.3, Sodium139, [id number]  11/24/2018 Creatinine0.92, BUN12, Potassium3.6, Sodium141, GFR>60  11/15/2018 Creatinine1.00, BUN17, Potassium3.7, Sodium140, GFR>60 A complete set of results can be found in Results Review.  Recommendations:Recommendation to limit salt and fluid intake.  Follow-up plan: ICM clinic phone appointment on6/22/2020.  Copy of ICM check sent to Dr.Allred.   3 month ICM trend: 04/28/2019    1 Year ICM trend:       [name], RN 05/01/2019 12:33 PM

## 2019-05-06 ENCOUNTER — Telehealth (HOSPITAL_COMMUNITY): Payer: Self-pay

## 2019-05-06 NOTE — Telephone Encounter (Signed)
I called pt to schedule a CHP visit for today.  She stated that she's uncomfortable with having people come inside her home. Pt states that she feels good since her last hospital stay. She stated that she would like to stay on the [facility] program and we agreed that I will call next week. Pt was also advised to call if she has any concerns.

## 2019-05-07 ENCOUNTER — Telehealth (HOSPITAL_COMMUNITY): Payer: Self-pay | Admitting: Licensed Clinical Social Worker

## 2019-05-07 NOTE — Telephone Encounter (Signed)
CSW contacted patient to follow up on weekly food delivery package. Patient informed of no face to face contact during delivery. CSW discussed transition option for food delivery as the Covid 19 Food relief program will be ending on May 23, 2019. Patient verbalizes understanding and grateful for the assistance- feels as if this has been very helpful in keeping her at home and health.  CSW continues to follow for supportive needs.   Jorge Ny, LCSW Clinical Social Worker [facility] Desk#: [phone] Cell#: [phone]

## 2019-05-14 ENCOUNTER — Telehealth (HOSPITAL_COMMUNITY): Payer: Self-pay | Admitting: Licensed Clinical Social Worker

## 2019-05-14 NOTE — Telephone Encounter (Signed)
CSW contacted patient to follow up on weekly food delivery package. Patient informed of no face to face contact during delivery. CSW discussed transition option for food delivery as the Covid 19 Food relief program will be ending on May 23, 2019. Patient verbalizes understanding and grateful for the assistance.  CSW continues to follow for supportive needs. Jackie Aybree Lanyon, LCSW, CCSW-MCS [phone] 

## 2019-05-16 ENCOUNTER — Telehealth (HOSPITAL_COMMUNITY): Payer: Self-pay | Admitting: Licensed Clinical Social Worker

## 2019-05-16 NOTE — Telephone Encounter (Signed)
CSW reached out to pt to check in regarding food and medication status at this time. Pt states she is doing well and has everything she needs at this time.  Patient is on paramedicine but is not being seen by the community paramedic at this time as she is doesn't want to risk having someone come in the house.  Pt states she is very comfortable calling us if she has any concerns however and continues to hear from her paramedic by phone.  Pt states she is getting a little stir crazy being at home so long but states she has been finding ways to stay busy regardless.  CSW encouraged pt to reach out with any concerns and will continue to follow and assist as needed  Jorge Ny, [place] Worker [facility] [phone]

## 2019-05-20 ENCOUNTER — Telehealth (HOSPITAL_COMMUNITY): Payer: Self-pay | Admitting: Licensed Clinical Social Worker

## 2019-05-20 NOTE — Telephone Encounter (Signed)
CSW contacted patient to follow up on weekly food delivery package. Patient informed of no face to face contact during delivery. CSW discussed transition option for food delivery as the Covid 19 Food relief program will be ending on May 23, 2019. Patient verbalizes understanding and grateful for the assistance- feels as if she will be able to manage on her own after delivieries end on Friday.  CSW continues to follow for supportive needs.   Jorge Ny, LCSW Clinical Social Worker [facility] Desk#: [phone] Cell#: [phone]

## 2019-05-27 ENCOUNTER — Telehealth (HOSPITAL_COMMUNITY): Payer: Self-pay

## 2019-05-27 NOTE — Telephone Encounter (Signed)
Called and spoke to pt regarding Virtual Cardiac Rehab. Pt stated she would like to wait until Cardiac Rehab in house open back up.

## 2019-06-02 ENCOUNTER — Telehealth: Payer: Self-pay

## 2019-06-02 ENCOUNTER — Ambulatory Visit (INDEPENDENT_AMBULATORY_CARE_PROVIDER_SITE_OTHER): Payer: BC Managed Care – PPO

## 2019-06-02 DIAGNOSIS — I5022 Chronic systolic (congestive) heart failure: Secondary | ICD-10-CM

## 2019-06-02 DIAGNOSIS — Z95810 Presence of automatic (implantable) cardiac defibrillator: Secondary | ICD-10-CM

## 2019-06-02 NOTE — Telephone Encounter (Signed)
Remote ICM transmission received.  Attempted call to patient regarding ICM remote transmission and voice mail has not been set up.

## 2019-06-02 NOTE — Progress Notes (Signed)
EPIC Encounter for ICM Monitoring  Patient Name: Brittany [name] is a 58 y.o. female Date: 06/02/2019 Primary Care Physican: Scifres, [name], PA-C Primary Cardiologist:Bensimhon Electrophysiologist:Allred 4/17/2020Weight: 101 lbs (baseline)   Attempted call to patient and unable to reach.   Transmission reviewed.   Optivol Thoracic impedanceabnormal suggesting possible fluid accumulation since 05/23/2019.   Prescribed: Torsemide20 mgTake 3 tablets (60 mg total) by mouth daily. Take additional as directed by HF team. (take 70 mg (3.5 tabs) on MWF  Labs: 04/21/2019 Creatinine 1.47, BUN 19, Potassium 3.5, Sodium 141, GFR 39-45 02/24/2019 Creatinine1.04, BUN16, Potassium3.6, Sodium141, GFR59->60, BNP 1,152.0 02/14/2019 Creatinine1.10, BUN30, Potassium4.1, Sodium136, GFR55->60  02/13/2019 Creatinine1.02, BUN21, Potassium3.8, Sodium137, GFR>60  02/12/2019 Creatinine1.26, BUN22, Potassium3.4, [id number], [id number] 11/26/2018 Creatinine1.02, BUN24, Potassium3.8, Sodium139, GFR>60  11/25/2018 Creatinine1.31, BUN26, Potassium3.3, Sodium139, MBP11-21  11/24/2018 Creatinine0.92, BUN12, Potassium3.6, Sodium141, GFR>60  11/15/2018 Creatinine1.00, BUN17, Potassium3.7, Sodium140, GFR>60 A complete set of results can be found in Results Review.  Recommendations: Unable to reach.    Follow-up plan: ICM clinic phone appointment on6/29/2020 (manual send) to recheck fluid levels.  Copy of ICM check sent to Dr.Allred and Dr [name] for review.  3 month ICM trend: 06/02/2019    1 Year ICM trend:       [name], RN 06/02/2019 11:26 AM

## 2019-06-06 ENCOUNTER — Telehealth (HOSPITAL_COMMUNITY): Payer: Self-pay

## 2019-06-06 NOTE — Telephone Encounter (Signed)
Pt insurance is active and benefits verified through [place]. Co-pay $0.00, DED $500.00/$500.00 met, out of pocket $2,600.00/$2,600.00 met, co-insurance 20%. No pre-authorization required. Passport, 06/06/2019 @ 11:36AM, [id number]  2ndary insurance is active and benefits verified through Medicaid. Co-pay $0.00, DED $0.00/$0.00 met, out of pocket $0.00/$0.00 met, co-insurance 0%. No pre-authorization required. Passport, 06/06/2019 @ 11:38AM, WCB#[id number]

## 2019-06-11 NOTE — Progress Notes (Signed)
No ICM remote transmission received for 06/09/2019 and next ICM transmission scheduled for 07/07/2019.

## 2019-06-12 ENCOUNTER — Other Ambulatory Visit: Payer: Self-pay

## 2019-06-12 ENCOUNTER — Ambulatory Visit (HOSPITAL_COMMUNITY)
Admission: RE | Admit: 2019-06-12 | Discharge: 2019-06-12 | Disposition: A | Discharge status: Home / Self Care | Payer: BC Managed Care – PPO | Source: Ambulatory Visit | Attending: Internal Medicine | Admitting: Internal Medicine

## 2019-06-12 ENCOUNTER — Encounter (HOSPITAL_COMMUNITY): Payer: Self-pay | Admitting: Internal Medicine

## 2019-06-12 ENCOUNTER — Telehealth (HOSPITAL_COMMUNITY): Payer: Self-pay

## 2019-06-12 VITALS — BP 98/64 | HR 76 | Wt 105.4 lb

## 2019-06-12 DIAGNOSIS — I480 Paroxysmal atrial fibrillation: Secondary | ICD-10-CM | POA: Insufficient documentation

## 2019-06-12 DIAGNOSIS — Z79899 Other long term (current) drug therapy: Secondary | ICD-10-CM | POA: Diagnosis not present

## 2019-06-12 DIAGNOSIS — Z85118 Personal history of other malignant neoplasm of bronchus and lung: Secondary | ICD-10-CM | POA: Diagnosis not present

## 2019-06-12 DIAGNOSIS — Z8249 Family history of ischemic heart disease and other diseases of the circulatory system: Secondary | ICD-10-CM | POA: Diagnosis not present

## 2019-06-12 DIAGNOSIS — I5022 Chronic systolic (congestive) heart failure: Secondary | ICD-10-CM

## 2019-06-12 DIAGNOSIS — Z7901 Long term (current) use of anticoagulants: Secondary | ICD-10-CM | POA: Diagnosis not present

## 2019-06-12 DIAGNOSIS — Z809 Family history of malignant neoplasm, unspecified: Secondary | ICD-10-CM | POA: Diagnosis not present

## 2019-06-12 DIAGNOSIS — F419 Anxiety disorder, unspecified: Secondary | ICD-10-CM | POA: Insufficient documentation

## 2019-06-12 DIAGNOSIS — I252 Old myocardial infarction: Secondary | ICD-10-CM | POA: Diagnosis not present

## 2019-06-12 DIAGNOSIS — Z955 Presence of coronary angioplasty implant and graft: Secondary | ICD-10-CM | POA: Insufficient documentation

## 2019-06-12 DIAGNOSIS — I482 Chronic atrial fibrillation, unspecified: Secondary | ICD-10-CM

## 2019-06-12 DIAGNOSIS — Z7982 Long term (current) use of aspirin: Secondary | ICD-10-CM | POA: Insufficient documentation

## 2019-06-12 DIAGNOSIS — J45909 Unspecified asthma, uncomplicated: Secondary | ICD-10-CM | POA: Insufficient documentation

## 2019-06-12 DIAGNOSIS — I2510 Atherosclerotic heart disease of native coronary artery without angina pectoris: Secondary | ICD-10-CM

## 2019-06-12 DIAGNOSIS — Z885 Allergy status to narcotic agent status: Secondary | ICD-10-CM | POA: Insufficient documentation

## 2019-06-12 DIAGNOSIS — J9611 Chronic respiratory failure with hypoxia: Secondary | ICD-10-CM | POA: Diagnosis not present

## 2019-06-12 DIAGNOSIS — I110 Hypertensive heart disease with heart failure: Secondary | ICD-10-CM | POA: Diagnosis not present

## 2019-06-12 DIAGNOSIS — F329 Major depressive disorder, single episode, unspecified: Secondary | ICD-10-CM | POA: Insufficient documentation

## 2019-06-12 DIAGNOSIS — Z833 Family history of diabetes mellitus: Secondary | ICD-10-CM | POA: Insufficient documentation

## 2019-06-12 DIAGNOSIS — Z95810 Presence of automatic (implantable) cardiac defibrillator: Secondary | ICD-10-CM | POA: Insufficient documentation

## 2019-06-12 DIAGNOSIS — J90 Pleural effusion, not elsewhere classified: Secondary | ICD-10-CM | POA: Diagnosis not present

## 2019-06-12 DIAGNOSIS — Z923 Personal history of irradiation: Secondary | ICD-10-CM | POA: Diagnosis not present

## 2019-06-12 DIAGNOSIS — N179 Acute kidney failure, unspecified: Secondary | ICD-10-CM | POA: Insufficient documentation

## 2019-06-12 DIAGNOSIS — Z951 Presence of aortocoronary bypass graft: Secondary | ICD-10-CM | POA: Diagnosis not present

## 2019-06-12 DIAGNOSIS — I25810 Atherosclerosis of coronary artery bypass graft(s) without angina pectoris: Secondary | ICD-10-CM | POA: Insufficient documentation

## 2019-06-12 DIAGNOSIS — Z87891 Personal history of nicotine dependence: Secondary | ICD-10-CM | POA: Insufficient documentation

## 2019-06-12 LAB — BASIC METABOLIC PANEL
Anion gap: 14 (ref 5–15)
BUN: 11 mg/dL (ref 6–20)
CO2: 22 mmol/L (ref 22–32)
Calcium: 9.9 mg/dL (ref 8.9–10.3)
Chloride: 105 mmol/L (ref 98–111)
Creatinine, Ser: 1.01 mg/dL — ABNORMAL HIGH (ref 0.44–1.00)
GFR calc Af Amer: >60 mL/min (ref >60)
GFR calc non Af Amer: >60 mL/min (ref >60)
Glucose, Bld: 137 mg/dL — ABNORMAL HIGH (ref 70–99)
Potassium: 3.1 mmol/L — ABNORMAL LOW (ref 3.5–5.1)
Sodium: 141 mmol/L (ref 135–145)

## 2019-06-12 LAB — BRAIN NATRIURETIC PEPTIDE: B Natriuretic Peptide: 1728.7 pg/mL — ABNORMAL HIGH (ref 0.0–100.0)

## 2019-06-12 LAB — CBC
HCT: 50 % — ABNORMAL HIGH (ref 36.0–46.0)
Hemoglobin: 16 g/dL — ABNORMAL HIGH (ref 12.0–15.0)
MCH: 30.3 pg (ref 26.0–34.0)
MCHC: 32 g/dL (ref 30.0–36.0)
MCV: 94.7 fL (ref 80.0–100.0)
Platelets: 241 10*3/uL (ref 150–400)
RBC: 5.28 MIL/uL — ABNORMAL HIGH (ref 3.87–5.11)
RDW: 20.3 % — ABNORMAL HIGH (ref 11.5–15.5)
WBC: 9.9 10*3/uL (ref 4.0–10.5)
nRBC: 0 % (ref 0.0–0.2)

## 2019-06-12 MED ORDER — TORSEMIDE 20 MG PO TABS: 60.0000 mg | ORAL_TABLET | Freq: Every day | ORAL | 3 refills | Status: DC

## 2019-06-12 NOTE — Telephone Encounter (Signed)
I called Mrs.

## 2019-06-12 NOTE — Patient Instructions (Signed)
Labs today and Repeat in 2 weeks. We will only contact you if something comes back abnormal or we need to make some changes. Otherwise no news is good news!  CHANGE how you take Torsemide.  Take Torsemide to 70mg  (3.5 tabs) on Monday and Friday.  Other days continue to take 60mg .  Your physician recommends that you schedule a follow-up appointment in: 2-3 months with Dr. [name]  At the [facility], you and your health needs are our priority. As part of our continuing mission to provide you with exceptional heart care, we have created designated Provider Care Teams. These Care Teams include your primary Cardiologist (physician) and Advanced Practice Providers (APPs- Physician Assistants and Nurse Practitioners) who all work together to provide you with the care you need, when you need it.   You may see any of the following providers on your designated Care Team at your next follow up: Marland Kitchen Dr Glori Bickers . Dr Loralie Champagne . Darrick Grinder, NP

## 2019-06-12 NOTE — Progress Notes (Signed)
Advanced [facility] Clinic Note   ID:  Brittany [name], Brittany [name] Feb 20, 1961, MRN [id number]  Location: Home  Provider location: [address], [place] Type of Visit: Established patient   PCP:  Scifres, [name], PA-C  Cardiologist:  Glori Bickers, MD Primary HF: Dr [name]  Chief Complaint: HF follow up   History of Present Illness: Brittany [name] a 58 y.o.femalewith a history of PAF,preveious smoker quit 4 years ago, hypertension, previous small cell lung cancer treated with chemo, chest XRT and prophylactic brain radiation in 2015, CAD s/p CABG and chronic systolic HF EF ~11%   Admitted 02/03/2018 with NSTEMI and shock. Underwent emergent cath 02/03/18 showed LAD 100% stenosed, LCx 95% stenosed. Taken for emergent CABG 02/03/18. Required impella post op. Hospital course complicated by cardiogenic shock, HCAP, A fib, respiratory failure, and swallowing issues. She was discharged to SNF. Discharge weight 103 pounds.   In 2019 had multiple hospitalizations for HF and pleural effusion. Underwent pleurodesis at [facility].  Admitted 3/4 - 02/14/2019 with NSTEMI and HF. Received DES to ostial ramus into distal left main based on cath below and underwent diuresis. Meds adjusted as tolerated. Echo with EF 25-30%  he had a ReDS check on 5/8 with reading of 35%, but weight was up 2 lbs and she was SOB.  Here for routine f/u. Has been more active with ADLs. Doing exercises at home.Including steps and leg exercises and walking. Also doing some weights. Gets SOB occasionally. No orthopnea, PND. Mild soreness on sternum. No angina. No problems with meds. No low BPs. Taking torsemide 60 daily. Was taking 60 daily with 70 on MWF but creatinine bumped from 1.1 to 1.47 and it was cut back to 60 daily.   ICD today: No VT/AF. Activity level 2.5 hrs fluid trending up (after decreasing torsemide) Personally reviewed  Pt denies symptoms of cough, fevers, chills, or new SOB worrisome  for COVID 19.    Echo 02/12/19: EF 25-30%, RV mildly reduced  LHC 02/12/19:  Ost LAD to Prox LAD lesion is 100% stenosed.  Ost Cx to Prox Cx lesion is 100% stenosed.  Ost Ramus lesion is 99% stenosed.  Post intervention, there is a 0% residual stenosis.  A drug-eluting stent was successfully placed using a Clancy 2.25X15.  SVG and is normal in caliber.  The graft exhibits no disease.  Ost 1st Diag lesion is 70% stenosed.  SVG and is normal in caliber.  Prox Graft lesion is 100% stenosed. 1. Significant underlying two-vessel coronary artery disease with patent SVG to OM and SVG to diagonal which supplies the LAD territory. Atretic LIMA to LAD given competitive flow from SVG to diagonal. Severe ostial ramus artery stenosis not supplied by bypass with his area suggestive of plaque rupture. 2. Severely elevated left ventricular end-diastolic pressure 34 mmHg. Left ventricular angiography was not performed. 3. Successful angioplasty and drug-eluting stent placement to the ostial ramus extending into the distal left main.  Past Medical History:  Diagnosis Date  . Acute systolic congestive heart failure ([place]) 02/03/2018  . Allergy   . Anxiety   . Asthma   . Hypertension   . PAF (paroxysmal atrial fibrillation) ([place])   . Prophylactic measure 08/03/14-08/19/14   Prophyl. cranial radiation 24 Gy  . 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 Small cell lung cancer ([place]) 03/16/2014  Past Surgical History:  Procedure Laterality Date  . CARDIAC DEFIBRILLATOR PLACEMENT  08/15/2018   MDT Visia AF MRI VR ICD implanted by Dr Loel Lofty for primary prevention of sudden  . CESAREAN SECTION    . CORONARY ARTERY BYPASS GRAFT N/A 02/03/2018   Procedure: CORONARY ARTERY BYPASS GRAFTING (CABG);  Surgeon: Rexene Alberts, MD;  Location: [place];  Service: Open Heart  Surgery;  Laterality: N/A;  Time 3 using left internal mammary artery and endoscopically harvested right saphenous vein  . CORONARY BALLOON ANGIOPLASTY N/A 02/03/2018   Procedure: CORONARY BALLOON ANGIOPLASTY;  Surgeon: Martinique, Peter M, MD;  Location: [place] [facility];  Service: Cardiovascular;  Laterality: N/A;  . CORONARY STENT INTERVENTION N/A 02/12/2019   Procedure: CORONARY STENT INTERVENTION;  Surgeon: Wellington Hampshire, MD;  Location: [place] [facility];  Service: Cardiovascular;  Laterality: N/A;  . CORONARY/GRAFT ACUTE MI REVASCULARIZATION N/A 02/03/2018   Procedure: Coronary/Graft Acute MI Revascularization;  Surgeon: Martinique, Peter M, MD;  Location: [place] [facility];  Service: Cardiovascular;  Laterality: N/A;  . IABP INSERTION N/A 02/03/2018   Procedure: IABP Insertion;  Surgeon: Martinique, Peter M, MD;  Location: [place] [facility];  Service: Cardiovascular;  Laterality: N/A;  . INTRAOPERATIVE TRANSESOPHAGEAL ECHOCARDIOGRAM N/A 02/03/2018   Procedure: INTRAOPERATIVE TRANSESOPHAGEAL ECHOCARDIOGRAM;  Surgeon: Rexene Alberts, MD;  Location: [place];  Service: Open Heart Surgery;  Laterality: N/A;  . LEFT HEART CATH AND CORONARY ANGIOGRAPHY N/A 02/03/2018   Procedure: LEFT HEART CATH AND CORONARY ANGIOGRAPHY;  Surgeon: Martinique, Peter M, MD;  Location: [place] [facility];  Service: Cardiovascular;  Laterality: N/A;  . LEFT HEART CATH AND CORS/GRAFTS ANGIOGRAPHY N/A 02/12/2019   Procedure: LEFT HEART CATH AND CORS/GRAFTS ANGIOGRAPHY;  Surgeon: Wellington Hampshire, MD;  Location: [place] [facility];  Service: Cardiovascular;  Laterality: N/A;  . MEDIASTINOSCOPY N/A 03/11/2014   Procedure: MEDIASTINOSCOPY;  Surgeon: Melrose Nakayama, MD;  Location: [place];  Service: Thoracic;  Laterality: N/A;  . PLACEMENT OF [place] LEFT VENTRICULAR ASSIST DEVICE  02/03/2018   Procedure: PLACEMENT OF [place] LEFT VENTRICULAR ASSIST DEVICE LD;  Surgeon: Rexene Alberts, MD;  Location: [place];  Service: Open Heart  Surgery;;  . REMOVAL OF IMPELLA LEFT VENTRICULAR ASSIST DEVICE N/A 02/08/2018   Procedure: REMOVAL OF [place] LEFT VENTRICULAR ASSIST DEVICE;  Surgeon: Rexene Alberts, MD;  Location: [place];  Service: Open Heart Surgery;  Laterality: N/A;  . RIGHT HEART CATH N/A 02/03/2018   Procedure: RIGHT HEART CATH;  Surgeon: Martinique, Peter M, MD;  Location: [place] [facility];  Service: Cardiovascular;  Laterality: N/A;  . RIGHT HEART CATH N/A 05/09/2018   Procedure: RIGHT HEART CATH;  Surgeon: Jolaine Artist, MD;  Location: [place] [facility];  Service: Cardiovascular;  Laterality: N/A;  . TEE WITHOUT CARDIOVERSION N/A 02/08/2018   Procedure: TRANSESOPHAGEAL ECHOCARDIOGRAM (TEE);  Surgeon: Rexene Alberts, MD;  Location: [place];  Service: Open Heart Surgery;  Laterality: N/A;  . TUBAL LIGATION    . VIDEO BRONCHOSCOPY WITH ENDOBRONCHIAL ULTRASOUND N/A 03/11/2014   Procedure: VIDEO BRONCHOSCOPY WITH ENDOBRONCHIAL ULTRASOUND;  Surgeon: Melrose Nakayama, MD;  Location: [place];  Service: Thoracic;  Laterality: N/A;     Current Outpatient Medications  Medication Sig Dispense Refill  . albuterol (VENTOLIN HFA) 108 (90 Base) MCG/ACT inhaler Inhale 2 puffs into the lungs every 6 (six) hours as needed for wheezing or shortness of breath. 1 Inhaler 0  . ALPRAZolam (XANAX) 0.25 MG tablet Take  1 tablet (0.25 mg total) by mouth daily as needed for anxiety. 10 tablet 0  . aspirin 81 MG EC tablet Take 1 tablet (81 mg total) by mouth daily. 30 tablet 1  . atorvastatin (LIPITOR) 80 MG tablet Take 1 tablet (80 mg total) by mouth daily at 6 PM. 30 tablet 1  . ipratropium-albuterol (DUONEB) 0.5-2.5 (3) MG/3ML SOLN Take 3 mLs by nebulization 2 (two) times daily. (Patient taking differently: Take 3 mLs by nebulization daily as needed (SOB). ) 360 mL 0  . ivabradine (CORLANOR) 7.5 MG TABS tablet Take 1 tablet (7.5 mg total) by mouth 2 (two) times daily with a meal. 60 tablet 6  . nitroGLYCERIN (NITROSTAT) 0.4 MG SL tablet Place  1 tablet (0.4 mg total) under the tongue every 5 (five) minutes as needed for chest pain. 90 tablet 5  . oxyCODONE (OXY IR/ROXICODONE) 5 MG immediate release tablet Take 5 mg by mouth every 6 (six) hours as needed for moderate pain.   0  . potassium chloride SA (K-DUR,KLOR-CON) 20 MEQ tablet Take 1 tablet (20 mEq total) by mouth daily. 90 tablet 3  . sertraline (ZOLOFT) 50 MG tablet Take 1 tablet (50 mg total) by mouth daily. 30 tablet 2  . spironolactone (ALDACTONE) 25 MG tablet Take 1 tablet (25 mg total) by mouth daily. 30 tablet 6  . ticagrelor (BRILINTA) 90 MG TABS tablet Take 1 tablet (90 mg total) by mouth 2 (two) times daily. 60 tablet 5  . torsemide (DEMADEX) 20 MG tablet Take 3 tablets (60 mg total) by mouth daily. Take additional as directed by HF team. (take 70 mg (3.5 tabs) on MWF 310 tablet 3   No current facility-administered medications for this encounter.     Allergies:   Codeine   Social History:  The patient  reports that she quit smoking about 5 years ago. Her smoking use included cigarettes. She has a 20.00 pack-year smoking history. She has never used smokeless tobacco. She reports current alcohol use of about 8.0 standard drinks of alcohol per week. She reports that she does not use drugs.   Family History:  The patient's family history includes Cancer in her maternal grandmother; Diabetes in her paternal grandmother; Heart attack in her mother; Heart disease in her mother; Hypertension in her maternal grandmother and mother.   ROS:  Please see the history of present illness.   All other systems are personally reviewed and negative.    Vitals:   06/12/19 1430  BP: 98/64  Pulse: 76  SpO2: 98%  Weight: 47.8 kg (105 lb 6.4 oz)   General:  Well appearing. No resp difficulty HEENT: normal Neck: supple. JVP 8. Carotids 2+ bilat; no bruits. No lymphadenopathy or thryomegaly appreciated. Cor: PMI nondisplaced. Regular rate & rhythm. No rubs, gallops or murmurs. Lungs:  clear Abdomen: soft, nontender, nondistended. No hepatosplenomegaly. No bruits or masses. Good bowel sounds. Extremities: no cyanosis, clubbing, rash, edema Neuro: alert & orientedx3, cranial nerves grossly intact. moves all 4 extremities w/o difficulty. Affect pleasant    Recent Labs: 02/12/2019: ALT 15; Magnesium 2.0 02/24/2019: B Natriuretic Peptide 1,152.0; Hemoglobin 15.2; Platelets 293 04/21/2019: BUN 19; Creatinine, Ser 1.47; Potassium 3.5; Sodium 141  Personally reviewed   Wt Readings from Last 3 Encounters:  06/12/19 47.8 kg (105 lb 6.4 oz)  04/25/19 47.2 kg (104 lb)  04/18/19 48.1 kg (106 lb)      ASSESSMENT AND PLAN:  1.Chronicsystolic HF due to ICM. S/p Medtronic single chamber ICD. -Echo 09/04/18 (  [facility]) LVEF 20%, Moderate AI, Mild MR, Mild TR, Severe LAE, RV mildly decreased.  - Echo 02/12/19: EF 25-30%, RV mildly reduced - Improved NYHA II-III - Volume status elevated on exam and by Optivol - Continue torsemide 60 mg daily with increase to 70mg  MF. Repeat BMET in 2 weeks to ensure renal function stable - Continue spiro 25 mg qHS.  - Continue corlanor 7.5 bid - Consider low-dose losartan soon (has not been able to tolerate due to low BP) - No b-blocker yet due to intolerance with low output  2. CAD  - Hx of NSTEMI/STEMI s/p Emergent CABG 02/03/18: Cath with severe 2v CAD as above with severe LV dysfunction/ICM. s/p Emergent CABG 02/03/18. - Had NSTEMI and now s/p LHC 02/12/19 with DES to the ostial ramus extending into the distal left main - She will need DAPT for at least 1 year. On ASA and Brilinta.  - No s/s ischemia - Has been doing home CR due to COVID  3. Recurrent pleural effusions s/p pleurodesis - Trace pleural effusion on right on CXR on recent admit. Clear on exam   4. Chronic hypoxic respiratory failure - Much improved. Wears O2 PRN at home.   5. PAF - CHA2DS2/VASc is at least 4. (CHF, Vasc disease, HTN, Female).  - She has history of Afib RVR  in the past in the setting of her Lung CA. Previously on Xarelto but stopped.  - Continue ASA and brilinta.  - No afib byICD interrogation today. Continue corlanor.   6. 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  - Chest CT 02/19/18 with mass-like consolidation in R hilum concerning for recurrent tumor.  - Repeat CT 03/27/2018 -No definite findings of locally recurrent tumor in the right lung. 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 Continued chest CT surveillance is advised in 3-6 months. - S/p thoracentesis 04/2018. Cytology negative for malignancy.  - CT chest 09/23/18 with slight increase in size of loculated right hydropneumothorax, status post thoracostomy tube removal. No evidence of tumor noted.  7. Anxiety/Depression - Improved. Continue zoloft 50 daily. Per PCP.   8. AKI - Creatinine up to 1.47 on 5/11. Diuretics decreased. Repeat BMET today    Signed, Glori Bickers, MD  06/12/2019 2:35 PM   [facility] [address] [facility] [place] [address] [phone] (office) [phone] (fax)

## 2019-06-12 NOTE — Telephone Encounter (Signed)
I called mrs. [place] to remind her of the 2 oclock appointment with Advance [facility].  She stated that she thought that the appointment was for 06/17/19.  Mrs. Fletcher asked if I could reschedule the appointment because hse doesn't have reliable transportation to and from the appointment.  I spoke with Dawn; she arranged a cab to pick pt up for the appointment.

## 2019-06-20 ENCOUNTER — Telehealth (HOSPITAL_COMMUNITY): Payer: Self-pay | Admitting: *Deleted

## 2019-06-20 ENCOUNTER — Other Ambulatory Visit: Payer: Self-pay | Admitting: Internal Medicine

## 2019-06-20 NOTE — Telephone Encounter (Signed)
Attempted to call pt regarding solution for transportation for cardiac rehab.  Unable to leave message. Cherre Huger, BSN Cardiac and Training and development officer

## 2019-06-24 ENCOUNTER — Telehealth (HOSPITAL_COMMUNITY): Payer: Self-pay | Admitting: *Deleted

## 2019-06-24 NOTE — Telephone Encounter (Signed)
Pt called and left message for cardiac rehab staff.  Attempted to return call to her but was unable to leave message.  The voicemail is not set up. Cherre Huger, BSN Cardiac and Training and development officer

## 2019-06-26 ENCOUNTER — Other Ambulatory Visit (HOSPITAL_COMMUNITY): Payer: BC Managed Care – PPO

## 2019-07-02 ENCOUNTER — Other Ambulatory Visit (HOSPITAL_COMMUNITY): Payer: BC Managed Care – PPO

## 2019-07-04 ENCOUNTER — Ambulatory Visit (INDEPENDENT_AMBULATORY_CARE_PROVIDER_SITE_OTHER): Payer: BC Managed Care – PPO | Admitting: *Deleted

## 2019-07-04 DIAGNOSIS — I482 Chronic atrial fibrillation, unspecified: Secondary | ICD-10-CM

## 2019-07-04 DIAGNOSIS — I5023 Acute on chronic systolic (congestive) heart failure: Secondary | ICD-10-CM

## 2019-07-04 LAB — CUP PACEART REMOTE DEVICE CHECK
Battery Remaining Longevity: 132 mo
Battery Voltage: 3.05 V
Brady Statistic RV Percent Paced: 0.01 %
Date Time Interrogation Session: 20200724073624
HighPow Impedance: 64 Ohm
Implantable Lead Implant Date: 20190905
Implantable Lead Location: 753860
Implantable Pulse Generator Implant Date: 20190905
Lead Channel Impedance Value: 323 Ohm
Lead Channel Impedance Value: 437 Ohm
Lead Channel Pacing Threshold Amplitude: 1 V
Lead Channel Pacing Threshold Pulse Width: 0.4 ms
Lead Channel Sensing Intrinsic Amplitude: 18.125 mV
Lead Channel Sensing Intrinsic Amplitude: 18.125 mV
Lead Channel Setting Pacing Amplitude: 2 V
Lead Channel Setting Pacing Pulse Width: 0.4 ms
Lead Channel Setting Sensing Sensitivity: 0.3 mV

## 2019-07-07 ENCOUNTER — Ambulatory Visit (INDEPENDENT_AMBULATORY_CARE_PROVIDER_SITE_OTHER): Payer: BC Managed Care – PPO

## 2019-07-07 DIAGNOSIS — I5022 Chronic systolic (congestive) heart failure: Secondary | ICD-10-CM | POA: Diagnosis not present

## 2019-07-07 DIAGNOSIS — Z95810 Presence of automatic (implantable) cardiac defibrillator: Secondary | ICD-10-CM | POA: Diagnosis not present

## 2019-07-09 ENCOUNTER — Telehealth: Payer: Self-pay

## 2019-07-09 NOTE — Progress Notes (Signed)
EPIC Encounter for ICM Monitoring  Patient Name: Darrelyn Morro is a 58 y.o. female Date: 07/09/2019 Primary Care Physican: Scifres, [name], PA-C Primary Cardiologist:Bensimhon Electrophysiologist:Allred Weight: 101 lbs (baseline)   Attempted call to patient and unable to reach.   Transmission reviewed.   OptivolThoracic impedance normal.   Prescribed: Torsemide20 mgTake 3 tablets (60 mg total) by mouth daily. Take additional as directed by HF team. Take 70mg  (3.5 tabs) on Monday and Friday only  Labs: 06/12/2019 Creatinine 1.01, BUN 11, Potassium 3.1, Sodium 141, GFR >60 04/21/2019 Creatinine 1.47, BUN 19, Potassium 3.5, Sodium 141, GFR 39-45 02/24/2019 Creatinine1.04, BUN16, Potassium3.6, Sodium141, GFR59->60, BNP 1,152.0 A complete set of results can be found in Results Review.  Recommendations: Unable to reach.    Follow-up plan: ICM clinic phone appointment on8/31/2020.  Copy of ICM check sent to Dr.Allred.  3 month ICM trend: 07/07/2019    1 Year ICM trend:       [name], RN 07/09/2019 9:51 AM

## 2019-07-09 NOTE — Telephone Encounter (Signed)
Remote ICM transmission received.  Attempted call to patient regarding ICM remote transmission and she requested a call back another day.

## 2019-07-11 NOTE — Progress Notes (Signed)
Remote ICD transmission.   

## 2019-07-21 ENCOUNTER — Other Ambulatory Visit (HOSPITAL_COMMUNITY): Payer: Self-pay

## 2019-07-21 MED ORDER — IPRATROPIUM-ALBUTEROL 0.5-2.5 (3) MG/3ML IN SOLN: 3.0000 mL | Freq: Two times a day (BID) | RESPIRATORY_TRACT | 0 refills | Status: DC

## 2019-08-11 ENCOUNTER — Ambulatory Visit (INDEPENDENT_AMBULATORY_CARE_PROVIDER_SITE_OTHER): Payer: BC Managed Care – PPO

## 2019-08-11 DIAGNOSIS — Z95810 Presence of automatic (implantable) cardiac defibrillator: Secondary | ICD-10-CM | POA: Diagnosis not present

## 2019-08-11 DIAGNOSIS — I5022 Chronic systolic (congestive) heart failure: Secondary | ICD-10-CM

## 2019-08-13 ENCOUNTER — Encounter (HOSPITAL_COMMUNITY): Payer: BC Managed Care – PPO | Admitting: Internal Medicine

## 2019-08-13 NOTE — Progress Notes (Signed)
EPIC Encounter for ICM Monitoring  Patient Name: Brittany [name] is a 58 y.o. female Date: 08/13/2019 Primary Care Physican: Scifres, [name], PA-C Primary Cardiologist:Bensimhon Electrophysiologist:Allred Weight: 101 lbs (baseline)   Transmission reviewed.   OptivolThoracic impedance normal.   Prescribed: Torsemide20 mgTake 3 tablets (60 mg total) by mouth daily. Take additional as directed by HF team. Take 70mg  (3.5 tabs) on Monday and Friday only  Labs: 06/12/2019 Creatinine 1.01, BUN 11, Potassium 3.1, Sodium 141, GFR >60 04/21/2019 Creatinine 1.47, BUN 19, Potassium 3.5, Sodium 141, GFR 39-45 02/24/2019 Creatinine1.04, BUN16, Potassium3.6, Sodium141, GFR59->60, BNP 1,152.0 A complete set of results can be found in Results Review.  Recommendations:None  Follow-up plan: ICM clinic phone appointment on 10/07/2019.   91 day device clinic remote transmission 10/06/2019.  Office appt 10/02/2019 with Dr. [name].    Copy of ICM check sent to Dr. Rayann Heman.   3 month ICM trend: 08/11/2019    1 Year ICM trend:       [name], RN 08/13/2019 10:20 AM

## 2019-09-01 ENCOUNTER — Telehealth (HOSPITAL_COMMUNITY): Payer: Self-pay | Admitting: Licensed Clinical Social Worker

## 2019-09-01 NOTE — Telephone Encounter (Signed)
CSW reached out to pt to check in regarding food and medication status at this time.   Pt states she is doing well and has everything she needs but has been having some concerns with a side effect from medications.  Pt states she has been bruising very easily with next to no contact and she attributes this to one of her medications, spirolactone.  Pt was considering stopping the medication.  CSW encouraged pt not to change the way she is taking her medications without consulting a medical provider.  CSW sent message to triage staff to follow up with patient.  CSW encouraged pt to reach out with any concerns and will continue to follow and assist as needed  Jorge Ny, [place] Worker [facility] [phone]

## 2019-09-01 NOTE — Telephone Encounter (Signed)
Routed to [place] for advice Please see note below    CSW reached out to pt to check in regarding food and medication status at this time.   Pt states she is doing well and has everything she needs but has been having some concerns with a side effect from medications.  Pt states she has been bruising very easily with next to no contact and she attributes this to one of her medications, spirolactone.  Pt was considering stopping the medication.  CSW encouraged pt not to change the way she is taking her medications without consulting a medical provider.  CSW sent message to triage staff to follow up with patient.  CSW encouraged pt to reach out with any concerns and will continue to follow and assist as needed  Jorge Ny, [place] Worker [facility] [phone]

## 2019-10-02 ENCOUNTER — Emergency Department (HOSPITAL_COMMUNITY)
Admission: EM | Admit: 2019-10-02 | Discharge: 2019-10-02 | Disposition: A | Discharge status: Home / Self Care | Payer: BC Managed Care – PPO | Attending: Emergency Medicine | Admitting: Emergency Medicine

## 2019-10-02 ENCOUNTER — Emergency Department (HOSPITAL_COMMUNITY): Payer: BC Managed Care – PPO

## 2019-10-02 ENCOUNTER — Encounter (HOSPITAL_COMMUNITY): Payer: Self-pay | Admitting: Emergency Medicine

## 2019-10-02 ENCOUNTER — Other Ambulatory Visit: Payer: Self-pay

## 2019-10-02 ENCOUNTER — Ambulatory Visit (HOSPITAL_BASED_OUTPATIENT_CLINIC_OR_DEPARTMENT_OTHER)
Admission: RE | Admit: 2019-10-02 | Discharge: 2019-10-02 | Disposition: A | Discharge status: Home / Self Care | Payer: BC Managed Care – PPO | Source: Ambulatory Visit | Attending: Internal Medicine | Admitting: Internal Medicine

## 2019-10-02 VITALS — BP 110/80 | HR 85 | Wt 104.4 lb

## 2019-10-02 DIAGNOSIS — Z951 Presence of aortocoronary bypass graft: Secondary | ICD-10-CM | POA: Diagnosis not present

## 2019-10-02 DIAGNOSIS — I2510 Atherosclerotic heart disease of native coronary artery without angina pectoris: Secondary | ICD-10-CM | POA: Diagnosis not present

## 2019-10-02 DIAGNOSIS — I5022 Chronic systolic (congestive) heart failure: Secondary | ICD-10-CM | POA: Diagnosis not present

## 2019-10-02 DIAGNOSIS — Z85118 Personal history of other malignant neoplasm of bronchus and lung: Secondary | ICD-10-CM | POA: Diagnosis not present

## 2019-10-02 DIAGNOSIS — R079 Chest pain, unspecified: Secondary | ICD-10-CM | POA: Diagnosis present

## 2019-10-02 DIAGNOSIS — J45909 Unspecified asthma, uncomplicated: Secondary | ICD-10-CM | POA: Insufficient documentation

## 2019-10-02 DIAGNOSIS — I252 Old myocardial infarction: Secondary | ICD-10-CM | POA: Insufficient documentation

## 2019-10-02 DIAGNOSIS — Z7982 Long term (current) use of aspirin: Secondary | ICD-10-CM | POA: Diagnosis not present

## 2019-10-02 DIAGNOSIS — I5021 Acute systolic (congestive) heart failure: Secondary | ICD-10-CM | POA: Diagnosis not present

## 2019-10-02 DIAGNOSIS — Z87891 Personal history of nicotine dependence: Secondary | ICD-10-CM | POA: Diagnosis not present

## 2019-10-02 DIAGNOSIS — I110 Hypertensive heart disease with heart failure: Secondary | ICD-10-CM | POA: Insufficient documentation

## 2019-10-02 LAB — BASIC METABOLIC PANEL
Anion gap: 15 (ref 5–15)
BUN: 18 mg/dL (ref 6–20)
CO2: 22 mmol/L (ref 22–32)
Calcium: 9.9 mg/dL (ref 8.9–10.3)
Chloride: 104 mmol/L (ref 98–111)
Creatinine, Ser: 1.09 mg/dL — ABNORMAL HIGH (ref 0.44–1.00)
GFR calc Af Amer: >60 mL/min (ref >60)
GFR calc non Af Amer: 56 mL/min — ABNORMAL LOW (ref >60)
Glucose, Bld: 111 mg/dL — ABNORMAL HIGH (ref 70–99)
Potassium: 3.3 mmol/L — ABNORMAL LOW (ref 3.5–5.1)
Sodium: 141 mmol/L (ref 135–145)

## 2019-10-02 LAB — CBG MONITORING, ED: Glucose-Capillary: 103 mg/dL — ABNORMAL HIGH (ref 70–99)

## 2019-10-02 LAB — CBC
HCT: 53.3 % — ABNORMAL HIGH (ref 36.0–46.0)
Hemoglobin: 16.8 g/dL — ABNORMAL HIGH (ref 12.0–15.0)
MCH: 30.2 pg (ref 26.0–34.0)
MCHC: 31.5 g/dL (ref 30.0–36.0)
MCV: 95.9 fL (ref 80.0–100.0)
Platelets: 212 10*3/uL (ref 150–400)
RBC: 5.56 MIL/uL — ABNORMAL HIGH (ref 3.87–5.11)
RDW: 21 % — ABNORMAL HIGH (ref 11.5–15.5)
WBC: 6 10*3/uL (ref 4.0–10.5)
nRBC: 0 % (ref 0.0–0.2)

## 2019-10-02 LAB — PROTIME-INR
INR: 1.1 (ref 0.8–1.2)
Prothrombin Time: 13.8 seconds (ref 11.4–15.2)

## 2019-10-02 LAB — TROPONIN I (HIGH SENSITIVITY)
Troponin I (High Sensitivity): 17 ng/L (ref <18)
Troponin I (High Sensitivity): 18 ng/L — ABNORMAL HIGH (ref <18)

## 2019-10-02 LAB — I-STAT BETA HCG BLOOD, ED (MC, WL, AP ONLY): I-stat hCG, quantitative: <5 m[IU]/mL (ref <5)

## 2019-10-02 LAB — BRAIN NATRIURETIC PEPTIDE: B Natriuretic Peptide: 1083.5 pg/mL — ABNORMAL HIGH (ref 0.0–100.0)

## 2019-10-02 MED ORDER — POTASSIUM CHLORIDE CRYS ER 20 MEQ PO TBCR
60.0000 meq | EXTENDED_RELEASE_TABLET | Freq: Once | ORAL | Status: AC
  Administered 2019-10-02: 60 meq via ORAL
  Filled 2019-10-02: qty 3

## 2019-10-02 MED ORDER — SODIUM CHLORIDE 0.9% FLUSH
3.0000 mL | Freq: Once | INTRAVENOUS | Status: AC
  Administered 2019-10-02: 17:00:00 3 mL via INTRAVENOUS

## 2019-10-02 MED ORDER — FUROSEMIDE 10 MG/ML IJ SOLN
80.0000 mg | Freq: Once | INTRAMUSCULAR | Status: AC
  Administered 2019-10-02: 17:00:00 80 mg via INTRAVENOUS
  Filled 2019-10-02: qty 8

## 2019-10-02 NOTE — Progress Notes (Signed)
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 Underwent emergent cath 02/03/18 showed LAD 100% stenosed, LCx 95% stenosed. Taken for emergent CABG 02/03/18. Required impella post op. Hospital course complicated by cardiogenic shock, HCAP, A fib, respiratory failure, and swallowing issues. She was discharged to SNF. Discharge weight 103 pounds.   In 2019 had multiple hospitalizations for HF and pleural effusion. Underwent pleurodesis at [facility].  Admitted 3/4 - 02/14/2019 with NSTEMI and HF. Received DES to ostial ramus into distal left main based on cath below and underwent diuresis. Meds adjusted as tolerated. Echo with EF 25-30%  he had a ReDS check on 5/8 with reading of 35%, but weight was up 2 lbs and she was SOB.  Here for routine f/u. Over past 2-3 days not feeling well. More SOB, + swelling and chest pain. Says rings not fitting well. Also having heartburn which was anginal equivalent in past. + Wheezing. Has not missed medicines. + cough. No fevers or chills. No COVID contacts    ICD today: Volume up earlier in the week but then down. Now coming back up. No VT/AF. Personally reviewed    Echo 02/12/19: EF 25-30%, RV mildly reduced  LHC 02/12/19:  Ost LAD to Prox LAD lesion is 100% stenosed.  Ost Cx to Prox Cx lesion is 100% stenosed.  Ost Ramus lesion is 99% stenosed.   Post intervention, there is a 0% residual stenosis.  A drug-eluting stent was successfully placed using a [id number] [id number].  SVG and is normal in caliber.  The graft exhibits no disease.  Ost 1st Diag lesion is 70% stenosed.  SVG and is normal in caliber.  Prox Graft lesion is 100% stenosed. 1. Significant underlying two-vessel coronary artery disease with patent SVG to OM and SVG to diagonal which supplies the LAD territory. Atretic LIMA to LAD given competitive flow from SVG to diagonal. Severe ostial ramus artery stenosis not supplied by bypass with his area suggestive of plaque rupture. 2. Severely elevated left ventricular end-diastolic pressure 34 mmHg. Left ventricular angiography was not performed. 3. Successful angioplasty and drug-eluting stent placement to the ostial ramus extending into the distal left main.  Past Medical History:  Diagnosis Date  . Acute systolic congestive heart failure ([place]) 02/03/2018  . Allergy   . Anxiety   . Asthma   . Hypertension   . PAF (paroxysmal atrial fibrillation) ([place])   . Prophylactic measure 08/03/14-08/19/14   Prophyl. cranial radiation 24 Gy  . 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 Small cell lung cancer ([place]) 03/16/2014   Past Surgical History:  Procedure Laterality Date  . CARDIAC DEFIBRILLATOR PLACEMENT  08/15/2018   MDT Visia AF MRI VR ICD implanted by Dr Loel Lofty for primary prevention of sudden  .  CESAREAN SECTION    . CORONARY ARTERY BYPASS GRAFT N/A 02/03/2018   Procedure: CORONARY ARTERY BYPASS GRAFTING (CABG);  Surgeon: Rexene Alberts, MD;  Location: [place];  Service: Open Heart Surgery;  Laterality: N/A;  Time 3 using left internal mammary artery and endoscopically harvested right saphenous vein  . CORONARY BALLOON ANGIOPLASTY N/A 02/03/2018   Procedure: CORONARY BALLOON ANGIOPLASTY;   Surgeon: Martinique, Peter M, MD;  Location: [place] [facility];  Service: Cardiovascular;  Laterality: N/A;  . CORONARY STENT INTERVENTION N/A 02/12/2019   Procedure: CORONARY STENT INTERVENTION;  Surgeon: Wellington Hampshire, MD;  Location: [place] [facility];  Service: Cardiovascular;  Laterality: N/A;  . CORONARY/GRAFT ACUTE MI REVASCULARIZATION N/A 02/03/2018   Procedure: Coronary/Graft Acute MI Revascularization;  Surgeon: Martinique, Peter M, MD;  Location: [place] [facility];  Service: Cardiovascular;  Laterality: N/A;  . IABP INSERTION N/A 02/03/2018   Procedure: IABP Insertion;  Surgeon: Martinique, Peter M, MD;  Location: [place] [facility];  Service: Cardiovascular;  Laterality: N/A;  . INTRAOPERATIVE TRANSESOPHAGEAL ECHOCARDIOGRAM N/A 02/03/2018   Procedure: INTRAOPERATIVE TRANSESOPHAGEAL ECHOCARDIOGRAM;  Surgeon: Rexene Alberts, MD;  Location: [place];  Service: Open Heart Surgery;  Laterality: N/A;  . LEFT HEART CATH AND CORONARY ANGIOGRAPHY N/A 02/03/2018   Procedure: LEFT HEART CATH AND CORONARY ANGIOGRAPHY;  Surgeon: Martinique, Peter M, MD;  Location: [place] [facility];  Service: Cardiovascular;  Laterality: N/A;  . LEFT HEART CATH AND CORS/GRAFTS ANGIOGRAPHY N/A 02/12/2019   Procedure: LEFT HEART CATH AND CORS/GRAFTS ANGIOGRAPHY;  Surgeon: Wellington Hampshire, MD;  Location: [place] [facility];  Service: Cardiovascular;  Laterality: N/A;  . MEDIASTINOSCOPY N/A 03/11/2014   Procedure: MEDIASTINOSCOPY;  Surgeon: Melrose Nakayama, MD;  Location: [place];  Service: Thoracic;  Laterality: N/A;  . PLACEMENT OF [place] LEFT VENTRICULAR ASSIST DEVICE  02/03/2018   Procedure: PLACEMENT OF [place] LEFT VENTRICULAR ASSIST DEVICE LD;  Surgeon: Rexene Alberts, MD;  Location: [place];  Service: Open Heart Surgery;;  . REMOVAL OF IMPELLA LEFT VENTRICULAR ASSIST DEVICE N/A 02/08/2018   Procedure: REMOVAL OF [place] LEFT VENTRICULAR ASSIST DEVICE;  Surgeon: Rexene Alberts, MD;  Location: [place];  Service: Open Heart  Surgery;  Laterality: N/A;  . RIGHT HEART CATH N/A 02/03/2018   Procedure: RIGHT HEART CATH;  Surgeon: Martinique, Peter M, MD;  Location: [place] [facility];  Service: Cardiovascular;  Laterality: N/A;  . RIGHT HEART CATH N/A 05/09/2018   Procedure: RIGHT HEART CATH;  Surgeon: Jolaine Artist, MD;  Location: [place] [facility];  Service: Cardiovascular;  Laterality: N/A;  . TEE WITHOUT CARDIOVERSION N/A 02/08/2018   Procedure: TRANSESOPHAGEAL ECHOCARDIOGRAM (TEE);  Surgeon: Rexene Alberts, MD;  Location: [place];  Service: Open Heart Surgery;  Laterality: N/A;  . TUBAL LIGATION    . VIDEO BRONCHOSCOPY WITH ENDOBRONCHIAL ULTRASOUND N/A 03/11/2014   Procedure: VIDEO BRONCHOSCOPY WITH ENDOBRONCHIAL ULTRASOUND;  Surgeon: Melrose Nakayama, MD;  Location: [place];  Service: Thoracic;  Laterality: N/A;     Current Outpatient Medications  Medication Sig Dispense Refill  . albuterol (VENTOLIN HFA) 108 (90 Base) MCG/ACT inhaler Inhale 2 puffs into the lungs every 6 (six) hours as needed for wheezing or shortness of breath. 1 Inhaler 0  . ALPRAZolam (XANAX) 0.25 MG tablet Take 1 tablet (0.25 mg total) by mouth daily as needed for anxiety. 10 tablet 0  . aspirin 81 MG EC tablet Take 1 tablet (81 mg total) by mouth daily. 30 tablet  1  . atorvastatin (LIPITOR) 80 MG tablet Take 1 tablet (80 mg total) by mouth daily at 6 PM. 30 tablet 1  . ipratropium-albuterol (DUONEB) 0.5-2.5 (3) MG/3ML SOLN Take 3 mLs by nebulization 2 (two) times daily. 360 mL 0  . ivabradine (CORLANOR) 7.5 MG TABS tablet Take 1 tablet (7.5 mg total) by mouth 2 (two) times daily with a meal. 60 tablet 6  . nitroGLYCERIN (NITROSTAT) 0.4 MG SL tablet Place 1 tablet (0.4 mg total) under the tongue every 5 (five) minutes as needed for chest pain. 90 tablet 5  . oxyCODONE (OXY IR/ROXICODONE) 5 MG immediate release tablet Take 5 mg by mouth every 6 (six) hours as needed for moderate pain.   0  . potassium chloride SA (K-DUR,KLOR-CON) 20 MEQ  tablet Take 1 tablet (20 mEq total) by mouth daily. 90 tablet 3  . sertraline (ZOLOFT) 50 MG tablet Take 1 tablet (50 mg total) by mouth daily. 30 tablet 2  . spironolactone (ALDACTONE) 25 MG tablet Take 1 tablet (25 mg total) by mouth daily. 30 tablet 6  . ticagrelor (BRILINTA) 90 MG TABS tablet Take 1 tablet (90 mg total) by mouth 2 (two) times daily. 60 tablet 5  . torsemide (DEMADEX) 20 MG tablet Take 3 tablets (60 mg total) by mouth daily. Take additional as directed by HF team. Take 70mg  (3.5 tabs) on Monday and Friday only 310 tablet 3   No current facility-administered medications for this encounter.     Allergies:   Codeine   Social History:  The patient  reports that she quit smoking about 5 years ago. Her smoking use included cigarettes. She has a 20.00 pack-year smoking history. She has never used smokeless tobacco. She reports current alcohol use of about 8.0 standard drinks of alcohol per week. She reports that she does not use drugs.   Family History:  The patient's family history includes Cancer in her maternal grandmother; Diabetes in her paternal grandmother; Heart attack in her mother; Heart disease in her mother; Hypertension in her maternal grandmother and mother.   ROS:  Please see the history of present illness.   All other systems are personally reviewed and negative.    Vitals:   10/02/19 1151  BP: 110/80  Pulse: 85  SpO2: 99%  Weight: 47.4 kg (104 lb 6.4 oz)   General:  SOB at rest. Mildly dipahoretic HEENT: normal Neck: supple.JVP 8-9 . Carotids 2+ bilat; no bruits. No lymphadenopathy or thryomegaly appreciated. Cor: PMI nondisplaced. Regular rate & rhythm. No rubs, gallops or murmurs. Lungs: coarse with crackles Abdomen: soft, nontender, nondistended. No hepatosplenomegaly. No bruits or masses. Good bowel sounds. Extremities: no cyanosis, clubbing, rash, tr edema Neuro: alert & orientedx3, cranial nerves grossly intact. moves all 4 extremities w/o  difficulty. Affect pleasant   NSR 87 No ST-T abnormalities Personally reviewed  Recent Labs: 02/12/2019: ALT 15; Magnesium 2.0 06/12/2019: B Natriuretic Peptide 1,728.7; BUN 11; Creatinine, Ser 1.01; Hemoglobin 16.0; Platelets 241; Potassium 3.1; Sodium 141  Personally reviewed   Wt Readings from Last 3 Encounters:  10/02/19 47.4 kg (104 lb 6.4 oz)  06/12/19 47.8 kg (105 lb 6.4 oz)  04/25/19 47.2 kg (104 lb)      ASSESSMENT AND PLAN:  1. CP and acute respiratory distress. - volume status mildly elevated on exam but is somewhat diaphoretic and uncomfortable - will send to ER for further w/u with CXR, troponin and covid test - give one dose IV lasix  - If not improved will  need admission  2.Acute on Chronicsystolic HF due to ICM. S/p Medtronic single chamber ICD. -Echo 09/04/18 ([facility]) LVEF 20%, Moderate AI, Mild MR, Mild TR, Severe LAE, RV mildly decreased.  - Echo 02/12/19: EF 25-30%, RV mildly reduced - As above, she is worse today NYHA IIIB-IV in setting of at least mild volume overload  - Volume status mildly elevated on exam and by Optivol - Send to ER for w/u as above - Continue torsemide 60 mg daily with increase to 70mg  MF.  - Continue spiro 25 mg qHS.  - Continue corlanor 7.5 bid - Consider low-dose losartan soon (has not been able to tolerate due to low BP) - No b-blocker yet due to intolerance with low output  3. CAD  - Has recurrent CP. Sned to ER to check trop - Hx of NSTEMI/STEMI s/p Emergent CABG 02/03/18: Cath with severe 2v CAD as above with severe LV dysfunction/ICM. s/p Emergent CABG 02/03/18. - Had NSTEMI and now s/p LHC 02/12/19 with DES to the ostial ramus extending into the distal left main - She will need DAPT for at least 1 year. On ASA and Brilinta.   4. Recurrent pleural effusions s/p pleurodesis - Trace pleural effusion on right on CXR on recent admit. Clear on exam   5. Chronic hypoxic respiratory failure -Wears O2 PRN at home.   6. PAF -  CHA2DS2/VASc is at least 4. (CHF, Vasc disease, HTN, Female).  - She has history of Afib RVR in the past in the setting of her Lung CA. Previously on Xarelto but stopped.  - Continue ASA and brilinta.  - No afib byICD interrogation today. Continue corlanor.   7. 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  - Chest CT 02/19/18 with mass-like consolidation in R hilum concerning for recurrent tumor.  - Repeat CT 03/27/2018 -No definite findings of locally recurrent tumor in the right lung. 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 Continued chest CT surveillance is advised in 3-6 months. - S/p thoracentesis 04/2018. Cytology negative for malignancy.  - CT chest 09/23/18 with slight increase in size of loculated right hydropneumothorax, status post thoracostomy tube removal. No evidence of tumor noted.  8. Anxiety/Depression - Improved. Continue zoloft 50 daily. Per PCP.   Total time spent 35 minutes. Over half that time spent discussing above.     Signed, Glori Bickers, MD  10/02/2019 11:57 AM   [facility] [address] [place] [address] [phone] (office) [phone] (fax)

## 2019-10-02 NOTE — ED Notes (Signed)
Patient verbalizes understanding of discharge instructions. Opportunity for questioning and answers were provided. Armband removed by staff, pt discharged from ED.  

## 2019-10-02 NOTE — Discharge Instructions (Addendum)
You are seen in the ER for shortness of breath and chest discomfort. Fortunately your heart enzymes are reassuring.  Chest x-ray is also not showing significant volume overload.  We recommend that you double your Demadex to 3 pills in the morning and 3 pills in the evening tomorrow. If you feel well and back to baseline normal on Saturday then you do not need to double your dose that day.  If you are still feeling easily winded on Saturday then take 2 rounds of diuretics that day as well.  Dr. Tempie Hoist will try and see you early next week.  Return to the ER if your symptoms get worse.

## 2019-10-02 NOTE — ED Notes (Signed)
Pt approaches me and tells me that she is not feeling well.  She would like to know how many patients are ahead of her, I let her know that she is next to go back.  Offered to check her VS

## 2019-10-02 NOTE — ED Triage Notes (Signed)
Pt presents from Dr. Bretta Bang office for chest pain and SOB. Pt reports this has been going on for 2 days.

## 2019-10-03 NOTE — ED Provider Notes (Signed)
[facility] [facility] EMERGENCY DEPARTMENT Provider Note   CSN: [id number] Arrival date & time: 10/02/19  1233     History   Chief Complaint Chief Complaint  Patient presents with  . Shortness of Breath  . Chest Pain    HPI Brittany [name] is a 58 y.o. female.     HPI 58 year old female comes in a chief complaint of chest pain and shortness of breath.  Patient has history of advanced heart failure, paroxysmal A. Fib and small cell lung carcinoma.  She reports worsening shortness of breath over the past few days.  Normally she is able to walk a couple of blocks without getting short of breath, but over the past few days she gets winded with minimal exertion.  She has some heartburn type chest pain that is intermittent.  She has no history of CAD, but her chest pain is different than her prior MI.  She also has history of paroxysmal A. fib but denies any palpitations.  She was seen in the clinic earlier today by Dr. [name] who requested that she come to the ER.  Patient has orthopnea but denies any worsening PND.  She also denies any weight gain.  She has been compliant with her medications.   Past Medical History:  Diagnosis Date  . Acute systolic congestive heart failure ([place]) 02/03/2018  . Allergy   . Anxiety   . Asthma   . Hypertension   . PAF (paroxysmal atrial fibrillation) ([name])   . Prophylactic measure 08/03/14-08/19/14   Prophyl. cranial radiation 24 Gy  . 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 Small cell lung cancer ([place]) 03/16/2014    Patient Active Problem List   Diagnosis Date Noted  . NSTEMI (non-ST elevated myocardial infarction) ([place]) 02/12/2019  . CHF (congestive heart failure) ([place]) 11/24/2018  . Recurrent pleural effusion on right 05/05/2018  . Acute respiratory failure ([place]) 05/05/2018  . Dyspnea 04/19/2018  . Chronic  respiratory failure with hypoxia ([place])   . Acute on chronic systolic CHF (congestive heart failure) ([place]) 02/03/2018  . S/P CABG (coronary artery bypass graft) 02/03/2018  . Asthma   . Anxiety   . Allergy   . HCAP (healthcare-associated pneumonia) 03/15/2015  . Chest pain 03/15/2015  . Small cell lung cancer ([place]) 03/16/2014  . Protein-calorie malnutrition, severe ([place]) 03/14/2014  . Atrial fibrillation ([place]) 03/12/2014  . HTN (hypertension) 05/07/2012    Past Surgical History:  Procedure Laterality Date  . CARDIAC DEFIBRILLATOR PLACEMENT  08/15/2018   MDT Visia AF MRI VR ICD implanted by Dr Loel Lofty for primary prevention of sudden  . CESAREAN SECTION    . CORONARY ARTERY BYPASS GRAFT N/A 02/03/2018   Procedure: CORONARY ARTERY BYPASS GRAFTING (CABG);  Surgeon: Rexene Alberts, MD;  Location: [facility];  Service: Open Heart Surgery;  Laterality: N/A;  Time 3 using left internal mammary artery and endoscopically harvested right saphenous vein  . CORONARY BALLOON ANGIOPLASTY N/A 02/03/2018   Procedure: CORONARY BALLOON ANGIOPLASTY;  Surgeon: Martinique, Peter M, MD;  Location: [facility];  Service: Cardiovascular;  Laterality: N/A;  . CORONARY STENT INTERVENTION N/A 02/12/2019   Procedure: CORONARY STENT INTERVENTION;  Surgeon: Wellington Hampshire, MD;  Location: [facility];  Service: Cardiovascular;  Laterality: N/A;  . CORONARY/GRAFT ACUTE MI REVASCULARIZATION N/A 02/03/2018   Procedure: Coronary/Graft Acute MI  Revascularization;  Surgeon: Martinique, Peter M, MD;  Location: [facility];  Service: Cardiovascular;  Laterality: N/A;  . IABP INSERTION N/A 02/03/2018   Procedure: IABP Insertion;  Surgeon: Martinique, Peter M, MD;  Location: [facility];  Service: Cardiovascular;  Laterality: N/A;  . INTRAOPERATIVE TRANSESOPHAGEAL ECHOCARDIOGRAM N/A 02/03/2018   Procedure: INTRAOPERATIVE TRANSESOPHAGEAL ECHOCARDIOGRAM;  Surgeon: Rexene Alberts, MD;  Location: [facility];  Service: Open  Heart Surgery;  Laterality: N/A;  . LEFT HEART CATH AND CORONARY ANGIOGRAPHY N/A 02/03/2018   Procedure: LEFT HEART CATH AND CORONARY ANGIOGRAPHY;  Surgeon: Martinique, Peter M, MD;  Location: [facility];  Service: Cardiovascular;  Laterality: N/A;  . LEFT HEART CATH AND CORS/GRAFTS ANGIOGRAPHY N/A 02/12/2019   Procedure: LEFT HEART CATH AND CORS/GRAFTS ANGIOGRAPHY;  Surgeon: Wellington Hampshire, MD;  Location: [facility];  Service: Cardiovascular;  Laterality: N/A;  . MEDIASTINOSCOPY N/A 03/11/2014   Procedure: MEDIASTINOSCOPY;  Surgeon: Melrose Nakayama, MD;  Location: [facility];  Service: Thoracic;  Laterality: N/A;  . PLACEMENT OF [place] LEFT VENTRICULAR ASSIST DEVICE  02/03/2018   Procedure: PLACEMENT OF [place] LEFT VENTRICULAR ASSIST DEVICE LD;  Surgeon: Rexene Alberts, MD;  Location: [facility];  Service: Open Heart Surgery;;  . REMOVAL OF IMPELLA LEFT VENTRICULAR ASSIST DEVICE N/A 02/08/2018   Procedure: REMOVAL OF [place] LEFT VENTRICULAR ASSIST DEVICE;  Surgeon: Rexene Alberts, MD;  Location: [facility];  Service: Open Heart Surgery;  Laterality: N/A;  . RIGHT HEART CATH N/A 02/03/2018   Procedure: RIGHT HEART CATH;  Surgeon: Martinique, Peter M, MD;  Location: [facility];  Service: Cardiovascular;  Laterality: N/A;  . RIGHT HEART CATH N/A 05/09/2018   Procedure: RIGHT HEART CATH;  Surgeon: Jolaine Artist, MD;  Location: [facility];  Service: Cardiovascular;  Laterality: N/A;  . TEE WITHOUT CARDIOVERSION N/A 02/08/2018   Procedure: TRANSESOPHAGEAL ECHOCARDIOGRAM (TEE);  Surgeon: Rexene Alberts, MD;  Location: [facility];  Service: Open Heart Surgery;  Laterality: N/A;  . TUBAL LIGATION    . VIDEO BRONCHOSCOPY WITH ENDOBRONCHIAL ULTRASOUND N/A 03/11/2014   Procedure: VIDEO BRONCHOSCOPY WITH ENDOBRONCHIAL ULTRASOUND;  Surgeon: Melrose Nakayama, MD;  Location: [facility];  Service: Thoracic;  Laterality: N/A;     OB History   No obstetric history on file.      Home Medications     Prior to Admission medications   Medication Sig Start Date End Date Taking? Authorizing Provider  albuterol (VENTOLIN HFA) 108 (90 Base) MCG/ACT inhaler Inhale 2 puffs into the lungs every 6 (six) hours as needed for wheezing or shortness of breath. 04/16/19  Yes Bensimhon, Shaune Pascal, MD  ALPRAZolam Duanne Moron) 0.25 MG tablet Take 1 tablet (0.25 mg total) by mouth daily as needed for anxiety. 02/24/19  Yes Shirley Friar, PA-C  aspirin 81 MG EC tablet Take 1 tablet (81 mg total) by mouth daily. 07/03/18  Yes Bensimhon, Shaune Pascal, MD  atorvastatin (LIPITOR) 80 MG tablet Take 1 tablet (80 mg total) by mouth daily at 6 PM. 07/03/18  Yes Bensimhon, Shaune Pascal, MD  ipratropium-albuterol (DUONEB) 0.5-2.5 (3) MG/3ML SOLN Take 3 mLs by nebulization 2 (two) times daily. 07/21/19  Yes Bensimhon, Shaune Pascal, MD  ivabradine (CORLANOR) 7.5 MG TABS tablet Take 1 tablet (7.5 mg total) by mouth 2 (two) times daily with a meal. 04/25/19  Yes Georgiana Shore, NP  potassium chloride SA (K-DUR,KLOR-CON) 20 MEQ tablet Take 1 tablet (20 mEq total) by  mouth daily. 02/26/19  Yes Shirley Friar, PA-C  sertraline (ZOLOFT) 50 MG tablet Take 1 tablet (50 mg total) by mouth daily. 02/24/19  Yes Shirley Friar, PA-C  spironolactone (ALDACTONE) 25 MG tablet Take 1 tablet (25 mg total) by mouth daily. 04/25/19  Yes Georgiana Shore, NP  ticagrelor (BRILINTA) 90 MG TABS tablet Take 1 tablet (90 mg total) by mouth 2 (two) times daily. 02/14/19  Yes Shirley Friar, PA-C  torsemide (DEMADEX) 20 MG tablet Take 3 tablets (60 mg total) by mouth daily. Take additional as directed by HF team. Take 70mg  (3.5 tabs) on Monday and Friday only Patient taking differently: Take 60 mg by mouth daily.  06/12/19  Yes Bensimhon, Shaune Pascal, MD  nitroGLYCERIN (NITROSTAT) 0.4 MG SL tablet Place 1 tablet (0.4 mg total) under the tongue every 5 (five) minutes as needed for chest pain. 02/14/19   Shirley Friar, PA-C  oxyCODONE (OXY  IR/ROXICODONE) 5 MG immediate release tablet Take 5 mg by mouth every 6 (six) hours as needed for moderate pain.  10/04/18   [provider]    Family History Family History  Problem Relation Age of Onset  . Heart disease Mother   . Hypertension Mother   . Heart attack Mother   . Hypertension Maternal Grandmother   . Cancer Maternal Grandmother   . Diabetes Paternal Grandmother   . Stroke Neg Hx     Social History Social History   Tobacco Use  . Smoking status: Former Smoker    Packs/day: 1.00    Years: 20.00    Pack years: 20.00    Types: Cigarettes    Quit date: 03/13/2014    Years since quitting: 5.5  . Smokeless tobacco: Never Used  Substance Use Topics  . Alcohol use: Yes    Alcohol/week: 8.0 standard drinks    Types: 8 Glasses of wine per week  . Drug use: No     Allergies   Codeine   Review of Systems Review of Systems  Constitutional: Positive for activity change.  Respiratory: Positive for shortness of breath. Negative for cough.   Cardiovascular: Positive for chest pain.  Neurological: Negative for dizziness.  All other systems reviewed and are negative.    Physical Exam Updated Vital Signs BP 128/80   Pulse 81   Temp 98.8 F (37.1 C) (Oral)   Resp 20   Ht 4\' 11"  (1.499 m)   Wt 47.2 kg   LMP  (Exact Date)   SpO2 98%   BMI 21.01 kg/m   Physical Exam Vitals signs and nursing note reviewed.  Constitutional:      Appearance: She is well-developed.  HENT:     Head: Normocephalic and atraumatic.  Neck:     Musculoskeletal: Normal range of motion and neck supple.     Vascular: No JVD.  Cardiovascular:     Rate and Rhythm: Normal rate.  Pulmonary:     Effort: Pulmonary effort is normal.     Breath sounds: Examination of the right-lower field reveals rales. Examination of the left-lower field reveals rales. Rales present. No wheezing or rhonchi.  Abdominal:     General: Bowel sounds are normal.  Musculoskeletal:     Right lower  leg: She exhibits no tenderness. No edema.     Left lower leg: She exhibits no tenderness. No edema.  Skin:    General: Skin is warm and dry.  Neurological:     Mental Status: She is alert and  oriented to person, place, and time.      ED Treatments / Results  Labs (all labs ordered are listed, but only abnormal results are displayed) Labs Reviewed  BASIC METABOLIC PANEL - Abnormal; Notable for the following components:      Result Value   Potassium 3.3 (*)    Glucose, Bld 111 (*)    Creatinine, Ser 1.09 (*)    GFR calc non Af Amer 56 (*)    All other components within normal limits  CBC - Abnormal; Notable for the following components:   RBC 5.56 (*)    Hemoglobin 16.8 (*)    HCT 53.3 (*)    RDW 21.0 (*)    All other components within normal limits  BRAIN NATRIURETIC PEPTIDE - Abnormal; Notable for the following components:   B Natriuretic Peptide 1,083.5 (*)    All other components within normal limits  CBG MONITORING, ED - Abnormal; Notable for the following components:   Glucose-Capillary 103 (*)    All other components within normal limits  TROPONIN I (HIGH SENSITIVITY) - Abnormal; Notable for the following components:   Troponin I (High Sensitivity) 18 (*)    All other components within normal limits  PROTIME-INR  I-STAT BETA HCG BLOOD, ED ([facility], [facility], [facility] ONLY)  TROPONIN I (HIGH SENSITIVITY)    EKG None  Radiology Dg Chest 2 View  Result Date: 10/02/2019 CLINICAL DATA:  Pt had some chest pain yesterday, during checkup with heart Dr. Today some abnormal sounds were heard in chest as well as pt is having some SOB and dizziness today - hx of lung cancer, asthma, CHF, htn, PAF, open heart surgery EXAM: CHEST - 2 VIEW COMPARISON:  02/12/2019 FINDINGS: LEFT-sided transvenous pacemaker lead to the coronary sinus. The heart is enlarged and stable in configuration. Status post median sternotomy and CABG. Lungs are hyperinflated. There are small bilateral pleural effusions. No  pulmonary edema or consolidation. IMPRESSION: Small bilateral pleural effusions. Electronically Signed   By: Nolon Nations M.D.   On: 10/02/2019 14:03    Procedures Procedures (including critical care time)  Medications Ordered in ED Medications  sodium chloride flush (NS) 0.9 % injection 3 mL (3 mLs Intravenous Given 10/02/19 1718)  furosemide (LASIX) injection 80 mg (80 mg Intravenous Given 10/02/19 1717)  potassium chloride SA (KLOR-CON) CR tablet 60 mEq (60 mEq Oral Given 10/02/19 1707)     Initial Impression / Assessment and Plan / ED Course  I have reviewed the triage vital signs and the nursing notes.  Pertinent labs & imaging results that were available during my care of the patient were reviewed by me and considered in my medical decision making (see chart for details).        58 year old female with history of CAD, advanced CHF, paroxysmal A. fib comes in a chief complaint of shortness of breath.  She also has remote history of nonsmall cell lung carcinoma.  She was seen by cardiology clinic and sent to the ED for likely worsening of her CHF.  Chest x-ray is not indicative of significant pleural effusion or pulmonary edema.  Patient has nonspecific chest pain described as heartburn type pain.  We do not think she is having MI.  EKG is reassuring and unchanged.  She has exertional dyspnea and differential primarily is acute CHF. PE is considered in the differential diagnosis, however patient was given IV Lasix in the ED at the request of CHF team and she felt a lot better.  On reassessment  she informed me that she had gone to the bathroom 4 times already, producing about 150 cc of urine each time and she feels a lot better already.  I discussed the finding with Dr. [name], cardiology.  He recommends that patient double up on Lasix tomorrow and he will follow up with her in the clinic.  Strict ER return precautions have been discussed with the patient's.  She will come  back to the ED if she has worsening shortness of breath, near fainting, severe chest pain.  Final Clinical Impressions(s) / ED Diagnoses   Final diagnoses:  Acute systolic congestive heart failure [facility])    ED Discharge Orders    None       Varney Biles, MD 10/03/19 0041

## 2019-10-06 ENCOUNTER — Ambulatory Visit (INDEPENDENT_AMBULATORY_CARE_PROVIDER_SITE_OTHER): Payer: BC Managed Care – PPO | Admitting: *Deleted

## 2019-10-06 DIAGNOSIS — I5023 Acute on chronic systolic (congestive) heart failure: Secondary | ICD-10-CM | POA: Diagnosis not present

## 2019-10-06 DIAGNOSIS — I509 Heart failure, unspecified: Secondary | ICD-10-CM

## 2019-10-07 ENCOUNTER — Ambulatory Visit (INDEPENDENT_AMBULATORY_CARE_PROVIDER_SITE_OTHER): Payer: BC Managed Care – PPO

## 2019-10-07 DIAGNOSIS — Z95810 Presence of automatic (implantable) cardiac defibrillator: Secondary | ICD-10-CM | POA: Diagnosis not present

## 2019-10-07 DIAGNOSIS — I5022 Chronic systolic (congestive) heart failure: Secondary | ICD-10-CM

## 2019-10-07 LAB — CUP PACEART REMOTE DEVICE CHECK
Battery Remaining Longevity: 130 mo
Battery Voltage: 3.04 V
Brady Statistic RV Percent Paced: 0.01 %
Date Time Interrogation Session: 20201026062503
HighPow Impedance: 68 Ohm
Implantable Lead Implant Date: 20190905
Implantable Lead Location: 753860
Implantable Pulse Generator Implant Date: 20190905
Lead Channel Impedance Value: 342 Ohm
Lead Channel Impedance Value: 494 Ohm
Lead Channel Pacing Threshold Amplitude: 0.875 V
Lead Channel Pacing Threshold Pulse Width: 0.4 ms
Lead Channel Sensing Intrinsic Amplitude: 20.625 mV
Lead Channel Sensing Intrinsic Amplitude: 20.625 mV
Lead Channel Setting Pacing Amplitude: 2 V
Lead Channel Setting Pacing Pulse Width: 0.4 ms
Lead Channel Setting Sensing Sensitivity: 0.3 mV

## 2019-10-10 NOTE — Progress Notes (Signed)
EPIC Encounter for ICM Monitoring  Patient Name: Brittany [name] is a 58 y.o. female Date: 10/10/2019 Primary Care Physican: Scifres, [name], PA-C Primary Cardiologist:Bensimhon Electrophysiologist:Allred 10/10/2019 Weight: 104 lbs   Spoke with patient. She is feeling better since receiving IV diuretics in ER on 10/22.  She is able to tell when she has fluid due to hand swelling and some swelling around the chest bra line but denies having any fluid since ER.    OptivolThoracic impedance normal.   Prescribed: Torsemide20 mgTake 3 tablets (60 mg total) by mouth daily. Take additional as directed by HF team.   Labs: 10/02/2019 Creatinine 1.09, BUN 18, Potassium 3.3, Sodium 141, GFR 56->60 06/12/2019 Creatinine 1.01, BUN 11, Potassium 3.1, Sodium 141, GFR >60 04/21/2019 Creatinine 1.47, BUN 19, Potassium 3.5, Sodium 141, GFR 39-45 02/24/2019 Creatinine1.04, BUN16, Potassium3.6, Sodium141, GFR59->60, BNP 1,152.0 A complete set of results can be found in Results Review.  Recommendations: No changes and encouraged to call if experiencing any fluid symptoms.  Follow-up plan: ICM clinic phone appointment on 11/10/2019.     Copy of ICM check sent to Dr. Rayann Heman.   3 month ICM trend: 10/06/2019    1 Year ICM trend:       [name], RN 10/10/2019 1:29 PM

## 2019-10-24 NOTE — Progress Notes (Addendum)
Remote ICD transmission.   

## 2019-10-30 ENCOUNTER — Other Ambulatory Visit (HOSPITAL_COMMUNITY): Payer: Self-pay | Admitting: Cardiology

## 2019-10-30 MED ORDER — TICAGRELOR 90 MG PO TABS: 90.0000 mg | ORAL_TABLET | Freq: Two times a day (BID) | ORAL | 1 refills | Status: DC

## 2019-11-11 ENCOUNTER — Telehealth: Payer: Self-pay

## 2019-11-11 NOTE — Telephone Encounter (Signed)
Left message for patient to remind of missed remote transmission.  

## 2019-11-17 NOTE — Progress Notes (Signed)
No ICM remote transmission received for 11/10/2019 and next ICM transmission scheduled for 12/22/2019.

## 2019-12-22 ENCOUNTER — Ambulatory Visit (INDEPENDENT_AMBULATORY_CARE_PROVIDER_SITE_OTHER): Payer: BC Managed Care – PPO

## 2019-12-22 DIAGNOSIS — Z95810 Presence of automatic (implantable) cardiac defibrillator: Secondary | ICD-10-CM

## 2019-12-22 DIAGNOSIS — I5022 Chronic systolic (congestive) heart failure: Secondary | ICD-10-CM | POA: Diagnosis not present

## 2019-12-24 ENCOUNTER — Telehealth: Payer: Self-pay

## 2019-12-24 NOTE — Telephone Encounter (Signed)
Remote ICM transmission received.  Attempted call to patient regarding ICM remote transmission and no answer.  

## 2019-12-24 NOTE — Progress Notes (Signed)
EPIC Encounter for ICM Monitoring  Patient Name: Brittany [name] is a 59 y.o. female Date: 12/24/2019 Primary Care Physican: Maude Leriche, PA-C Primary Cardiologist:Bensimhon Electrophysiologist:Allred 12/24/2019 Weight: 108 lbs   Spoke with patient and doing well.  She does not have any fluid symptoms.  She does have some anxiety regarding the COVID virus.  OptivolThoracic impedance normal.   Prescribed: Torsemide20 mgTake 3 tablets (60 mg total) by mouth daily. Take additional as directed by HF team.   Labs: 10/02/2019 Creatinine 1.09, BUN 18, Potassium 3.3, Sodium 141, GFR 56->60 06/12/2019 Creatinine 1.01, BUN 11, Potassium 3.1, Sodium 141, GFR >60 04/21/2019 Creatinine 1.47, BUN 19, Potassium 3.5, Sodium 141, GFR 39-45 02/24/2019 Creatinine1.04, BUN16, Potassium3.6, Sodium141, GFR59->60, BNP 1,152.0 A complete set of results can be found in Results Review.  Recommendations:  No changes and encouraged to call if experiencing any fluid symptoms.  Follow-up plan: ICM clinic phone appointment on 01/26/2020.   91 day device clinic remote transmission 01/05/2020.      Copy of ICM check sent to Dr. Rayann Heman.   3 month ICM trend: 12/22/2019    1 Year ICM trend:       [name], RN 12/24/2019 10:04 AM

## 2020-01-01 ENCOUNTER — Telehealth: Payer: Self-pay | Admitting: Internal Medicine

## 2020-01-01 NOTE — Telephone Encounter (Signed)
Received a msg to schedule an appt for Brittany [name] for possible lung cancer recurrence. [name] has been cld and scheduled to see Dr. Julien Nordmann on 2/2 at 2:15pm. Pt aware to arrive 15 minutes early.

## 2020-01-05 ENCOUNTER — Ambulatory Visit (INDEPENDENT_AMBULATORY_CARE_PROVIDER_SITE_OTHER): Payer: BC Managed Care – PPO | Admitting: *Deleted

## 2020-01-05 DIAGNOSIS — I5023 Acute on chronic systolic (congestive) heart failure: Secondary | ICD-10-CM

## 2020-01-05 LAB — CUP PACEART REMOTE DEVICE CHECK
Battery Remaining Longevity: 128 mo
Battery Voltage: 3.04 V
Brady Statistic RV Percent Paced: 0.01 %
Date Time Interrogation Session: 20210125043822
HighPow Impedance: 60 Ohm
Implantable Lead Implant Date: 20190905
Implantable Lead Location: 753860
Implantable Pulse Generator Implant Date: 20190905
Lead Channel Impedance Value: 323 Ohm
Lead Channel Impedance Value: 437 Ohm
Lead Channel Pacing Threshold Amplitude: 1 V
Lead Channel Pacing Threshold Pulse Width: 0.4 ms
Lead Channel Sensing Intrinsic Amplitude: 18.25 mV
Lead Channel Sensing Intrinsic Amplitude: 18.25 mV
Lead Channel Setting Pacing Amplitude: 2 V
Lead Channel Setting Pacing Pulse Width: 0.4 ms
Lead Channel Setting Sensing Sensitivity: 0.3 mV

## 2020-01-12 ENCOUNTER — Other Ambulatory Visit: Payer: Self-pay | Admitting: Medical Oncology

## 2020-01-12 DIAGNOSIS — C3490 Malignant neoplasm of unspecified part of unspecified bronchus or lung: Secondary | ICD-10-CM

## 2020-01-13 ENCOUNTER — Encounter: Payer: Self-pay | Admitting: Internal Medicine

## 2020-01-13 ENCOUNTER — Other Ambulatory Visit: Payer: Self-pay

## 2020-01-13 ENCOUNTER — Telehealth: Payer: Self-pay | Admitting: Internal Medicine

## 2020-01-13 ENCOUNTER — Inpatient Hospital Stay: Payer: BC Managed Care – PPO

## 2020-01-13 ENCOUNTER — Inpatient Hospital Stay: Payer: BC Managed Care – PPO | Attending: Internal Medicine | Admitting: Internal Medicine

## 2020-01-13 VITALS — BP 141/89 | HR 92 | Temp 98.5°F | Resp 19 | Ht 59.0 in | Wt 110.4 lb

## 2020-01-13 DIAGNOSIS — Z85118 Personal history of other malignant neoplasm of bronchus and lung: Secondary | ICD-10-CM | POA: Insufficient documentation

## 2020-01-13 DIAGNOSIS — J45909 Unspecified asthma, uncomplicated: Secondary | ICD-10-CM

## 2020-01-13 DIAGNOSIS — J9611 Chronic respiratory failure with hypoxia: Secondary | ICD-10-CM

## 2020-01-13 DIAGNOSIS — I110 Hypertensive heart disease with heart failure: Secondary | ICD-10-CM | POA: Diagnosis not present

## 2020-01-13 DIAGNOSIS — Z7982 Long term (current) use of aspirin: Secondary | ICD-10-CM | POA: Diagnosis not present

## 2020-01-13 DIAGNOSIS — C3490 Malignant neoplasm of unspecified part of unspecified bronchus or lung: Secondary | ICD-10-CM

## 2020-01-13 DIAGNOSIS — Z923 Personal history of irradiation: Secondary | ICD-10-CM | POA: Diagnosis not present

## 2020-01-13 DIAGNOSIS — I509 Heart failure, unspecified: Secondary | ICD-10-CM | POA: Diagnosis not present

## 2020-01-13 DIAGNOSIS — Z833 Family history of diabetes mellitus: Secondary | ICD-10-CM | POA: Diagnosis not present

## 2020-01-13 DIAGNOSIS — F1290 Cannabis use, unspecified, uncomplicated: Secondary | ICD-10-CM | POA: Diagnosis not present

## 2020-01-13 DIAGNOSIS — I10 Essential (primary) hypertension: Secondary | ICD-10-CM

## 2020-01-13 DIAGNOSIS — Z87891 Personal history of nicotine dependence: Secondary | ICD-10-CM | POA: Insufficient documentation

## 2020-01-13 DIAGNOSIS — F419 Anxiety disorder, unspecified: Secondary | ICD-10-CM | POA: Insufficient documentation

## 2020-01-13 DIAGNOSIS — Z9221 Personal history of antineoplastic chemotherapy: Secondary | ICD-10-CM | POA: Insufficient documentation

## 2020-01-13 DIAGNOSIS — Z95810 Presence of automatic (implantable) cardiac defibrillator: Secondary | ICD-10-CM

## 2020-01-13 DIAGNOSIS — Z8249 Family history of ischemic heart disease and other diseases of the circulatory system: Secondary | ICD-10-CM | POA: Insufficient documentation

## 2020-01-13 DIAGNOSIS — Z79899 Other long term (current) drug therapy: Secondary | ICD-10-CM | POA: Insufficient documentation

## 2020-01-13 DIAGNOSIS — I480 Paroxysmal atrial fibrillation: Secondary | ICD-10-CM | POA: Diagnosis not present

## 2020-01-13 DIAGNOSIS — Z951 Presence of aortocoronary bypass graft: Secondary | ICD-10-CM

## 2020-01-13 LAB — CMP (CANCER CENTER ONLY)
ALT: 10 U/L (ref 0–44)
AST: 18 U/L (ref 15–41)
Albumin: 4.4 g/dL (ref 3.5–5.0)
Alkaline Phosphatase: 118 U/L (ref 38–126)
Anion gap: 11 (ref 5–15)
BUN: 20 mg/dL (ref 6–20)
CO2: 30 mmol/L (ref 22–32)
Calcium: 10 mg/dL (ref 8.9–10.3)
Chloride: 102 mmol/L (ref 98–111)
Creatinine: 1.19 mg/dL — ABNORMAL HIGH (ref 0.44–1.00)
GFR, Est AFR Am: 58 mL/min — ABNORMAL LOW (ref >60)
GFR, Estimated: 50 mL/min — ABNORMAL LOW (ref >60)
Glucose, Bld: 154 mg/dL — ABNORMAL HIGH (ref 70–99)
Potassium: 3.7 mmol/L (ref 3.5–5.1)
Sodium: 143 mmol/L (ref 135–145)
Total Bilirubin: 0.8 mg/dL (ref 0.3–1.2)
Total Protein: 8 g/dL (ref 6.5–8.1)

## 2020-01-13 LAB — CBC WITH DIFFERENTIAL (CANCER CENTER ONLY)
Abs Immature Granulocytes: 0.02 10*3/uL (ref 0.00–0.07)
Basophils Absolute: 0.1 10*3/uL (ref 0.0–0.1)
Basophils Relative: 1 %
Eosinophils Absolute: 0.1 10*3/uL (ref 0.0–0.5)
Eosinophils Relative: 1 %
HCT: 47.7 % — ABNORMAL HIGH (ref 36.0–46.0)
Hemoglobin: 15 g/dL (ref 12.0–15.0)
Immature Granulocytes: 0 %
Lymphocytes Relative: 15 %
Lymphs Abs: 1.1 10*3/uL (ref 0.7–4.0)
MCH: 29.5 pg (ref 26.0–34.0)
MCHC: 31.4 g/dL (ref 30.0–36.0)
MCV: 93.7 fL (ref 80.0–100.0)
Monocytes Absolute: 0.6 10*3/uL (ref 0.1–1.0)
Monocytes Relative: 9 %
Neutro Abs: 5.3 10*3/uL (ref 1.7–7.7)
Neutrophils Relative %: 74 %
Platelet Count: 237 10*3/uL (ref 150–400)
RBC: 5.09 MIL/uL (ref 3.87–5.11)
RDW: 20.9 % — ABNORMAL HIGH (ref 11.5–15.5)
WBC Count: 7.2 10*3/uL (ref 4.0–10.5)
nRBC: 0 % (ref 0.0–0.2)

## 2020-01-13 NOTE — Progress Notes (Signed)
[facility] [phone]   [phone]  CONSULT NOTE  REFERRING PHYSICIAN: Dr. Glori Bickers  REASON FOR CONSULTATION:  59 years old African-American female with history of lung cancer.  HPI TRUE Brittany [name] is a 59 y.o. female with past medical history significant for limited stage small cell lung cancer diagnosed in March 2015 status post single fraction of radiotherapy to the mediastinum on March 13, 2014 followed by systemic chemotherapy with cisplatin and etoposide for 6 cycles completed on 06/29/2014.  This was followed by prophylactic cranial irradiation.  The patient has been on observation since that time and her last visit with me was on Apr 26, 2015.  She was supposed to come back for follow-up visit in 3 months but the patient was lost to follow-up since that time.  She mentions that she moved to [place] with her husband and stayed there for a while before coming back home.  She had several issues have been in the time being including multiple myocardial infarction as well as congestive heart failure status post pacemaker placement and she is followed by Dr. [name].  The patient had recurrent loculated pleural effusion and she underwent thoracentesis few times.  She was found on recent chest x-ray to have small bilateral pleural effusion and she was referred to me today for reevaluation and recommendation regarding her condition. When seen today she is feeling fine except for fatigue.  She denied having any current chest pain but has shortness of breath with exertion with no cough or hemoptysis.  She denied having any fever or chills.  She has no nausea, vomiting, diarrhea or constipation.  She has no headache or visual changes. Family history significant for mother with hypertension and heart disease. The patient is married but separated.  She continues to smoke marijuana at regular basis she quit smoking cigarettes.  She also drinks  alcohol few times a week.  HPI  Past Medical History:  Diagnosis Date  . Acute systolic congestive heart failure ([place]) 02/03/2018  . Allergy   . Anxiety   . Asthma   . Hypertension   . PAF (paroxysmal atrial fibrillation) ([place])   . Prophylactic measure 08/03/14-08/19/14   Prophyl. cranial radiation 24 Gy  . 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 Small cell lung cancer ([place]) 03/16/2014    Past Surgical History:  Procedure Laterality Date  . CARDIAC DEFIBRILLATOR PLACEMENT  08/15/2018   MDT Visia AF MRI VR ICD implanted by Dr Loel Lofty for primary prevention of sudden  . CESAREAN SECTION    . CORONARY ARTERY BYPASS GRAFT N/A 02/03/2018   Procedure: CORONARY ARTERY BYPASS GRAFTING (CABG);  Surgeon: Rexene Alberts, MD;  Location: [facility];  Service: Open Heart Surgery;  Laterality: N/A;  Time 3 using left internal mammary artery and endoscopically harvested right saphenous vein  . CORONARY BALLOON ANGIOPLASTY N/A 02/03/2018   Procedure: CORONARY BALLOON ANGIOPLASTY;  Surgeon: Martinique, Peter M, MD;  Location: [facility];  Service: Cardiovascular;  Laterality: N/A;  . CORONARY STENT INTERVENTION N/A 02/12/2019   Procedure: CORONARY STENT INTERVENTION;  Surgeon: Wellington [place], MD;  Location: [facility];  Service: Cardiovascular;  Laterality: N/A;  . CORONARY/GRAFT ACUTE MI REVASCULARIZATION N/A 02/03/2018   Procedure: Coronary/[place]raft Acute MI Revascularization;  Surgeon: Martinique, Peter M, MD;  Location: [facility] INVASIVE [facility]  LAB;  Service: Cardiovascular;  Laterality: N/A;  . IABP INSERTION N/A 02/03/2018   Procedure: IABP Insertion;  Surgeon: Martinique, Peter M, MD;  Location: [facility];  Service: Cardiovascular;  Laterality: N/A;  . INTRAOPERATIVE TRANSESOPHAGEAL ECHOCARDIOGRAM N/A 02/03/2018   Procedure: INTRAOPERATIVE TRANSESOPHAGEAL ECHOCARDIOGRAM;  Surgeon: Rexene Alberts, MD;  Location: [facility];  Service: Open Heart Surgery;  Laterality: N/A;  . LEFT HEART CATH AND CORONARY ANGIOGRAPHY N/A 02/03/2018   Procedure: LEFT HEART CATH AND CORONARY ANGIOGRAPHY;  Surgeon: Martinique, Peter M, MD;  Location: [facility];  Service: Cardiovascular;  Laterality: N/A;  . LEFT HEART CATH AND CORS/GRAFTS ANGIOGRAPHY N/A 02/12/2019   Procedure: LEFT HEART CATH AND CORS/GRAFTS ANGIOGRAPHY;  Surgeon: Wellington [place], MD;  Location: [facility];  Service: Cardiovascular;  Laterality: N/A;  . MEDIASTINOSCOPY N/A 03/11/2014   Procedure: MEDIASTINOSCOPY;  Surgeon: Melrose Nakayama, MD;  Location: [facility];  Service: Thoracic;  Laterality: N/A;  . PLACEMENT OF [place] LEFT VENTRICULAR ASSIST DEVICE  02/03/2018   Procedure: PLACEMENT OF [place] LEFT VENTRICULAR ASSIST DEVICE LD;  Surgeon: Rexene Alberts, MD;  Location: [facility];  Service: Open Heart Surgery;;  . REMOVAL OF IMPELLA LEFT VENTRICULAR ASSIST DEVICE N/A 02/08/2018   Procedure: REMOVAL OF [place] LEFT VENTRICULAR ASSIST DEVICE;  Surgeon: Rexene Alberts, MD;  Location: [facility];  Service: Open Heart Surgery;  Laterality: N/A;  . RIGHT HEART CATH N/A 02/03/2018   Procedure: RIGHT HEART CATH;  Surgeon: Martinique, Peter M, MD;  Location: [facility];  Service: Cardiovascular;  Laterality: N/A;  . RIGHT HEART CATH N/A 05/09/2018   Procedure: RIGHT HEART CATH;  Surgeon: Jolaine Artist, MD;  Location: [facility];  Service: Cardiovascular;  Laterality: N/A;  . TEE WITHOUT CARDIOVERSION N/A 02/08/2018   Procedure: TRANSESOPHAGEAL ECHOCARDIOGRAM (TEE);  Surgeon: Rexene Alberts, MD;  Location: [facility];  Service: Open Heart Surgery;  Laterality: N/A;  . TUBAL LIGATION    . VIDEO BRONCHOSCOPY WITH ENDOBRONCHIAL ULTRASOUND N/A 03/11/2014   Procedure: VIDEO BRONCHOSCOPY WITH ENDOBRONCHIAL ULTRASOUND;  Surgeon: Melrose Nakayama, MD;  Location: [facility];  Service: Thoracic;  Laterality: N/A;    Family History    Problem Relation Age of Onset  . Heart disease Mother   . Hypertension Mother   . Heart attack Mother   . Hypertension Maternal Grandmother   . Cancer Maternal Grandmother   . Diabetes Paternal Grandmother   . Stroke Neg Hx     Social History Social History   Tobacco Use  . Smoking status: Former Smoker    Packs/day: 1.00    Years: 20.00    Pack years: 20.00    Types: Cigarettes    Quit date: 03/13/2014    Years since quitting: 5.8  . Smokeless tobacco: Never Used  Substance Use Topics  . Alcohol use: Yes    Alcohol/week: 8.0 standard drinks    Types: 8 Glasses of wine per week  . Drug use: No    Allergies  Allergen Reactions  . Codeine Nausea And Vomiting    Current Outpatient Medications  Medication Sig Dispense Refill  . albuterol (VENTOLIN HFA) 108 (90 Base) MCG/ACT inhaler Inhale 2 puffs into the lungs every 6 (six) hours as needed for wheezing or shortness of breath. 1 Inhaler 0  . ALPRAZolam (XANAX) 0.25 MG tablet Take 1 tablet (0.25 mg total) by mouth daily as needed for anxiety. 10 tablet 0  . aspirin 81 MG  EC tablet Take 1 tablet (81 mg total) by mouth daily. 30 tablet 1  . atorvastatin (LIPITOR) 80 MG tablet Take 1 tablet (80 mg total) by mouth daily at 6 PM. 30 tablet 1  . ipratropium-albuterol (DUONEB) 0.5-2.5 (3) MG/3ML SOLN Take 3 mLs by nebulization 2 (two) times daily. 360 mL 0  . ivabradine (CORLANOR) 7.5 MG TABS tablet Take 1 tablet (7.5 mg total) by mouth 2 (two) times daily with a meal. 60 tablet 6  . nitroGLYCERIN (NITROSTAT) 0.4 MG SL tablet Place 1 tablet (0.4 mg total) under the tongue every 5 (five) minutes as needed for chest pain. 90 tablet 5  . oxyCODONE (OXY IR/ROXICODONE) 5 MG immediate release tablet Take 5 mg by mouth every 6 (six) hours as needed for moderate pain.   0  . potassium chloride SA (K-DUR,KLOR-CON) 20 MEQ tablet Take 1 tablet (20 mEq total) by mouth daily. 90 tablet 3  . sertraline (ZOLOFT) 50 MG tablet Take 1 tablet (50 mg  total) by mouth daily. 30 tablet 2  . spironolactone (ALDACTONE) 25 MG tablet Take 1 tablet (25 mg total) by mouth daily. 30 tablet 6  . ticagrelor (BRILINTA) 90 MG TABS tablet Take 1 tablet (90 mg total) by mouth 2 (two) times daily. 60 tablet 1  . torsemide (DEMADEX) 20 MG tablet Take 3 tablets (60 mg total) by mouth daily. Take additional as directed by HF team. Take 70mg  (3.5 tabs) on Monday and Friday only (Patient taking differently: Take 60 mg by mouth daily. ) 310 tablet 3   No current facility-administered medications for this visit.    Review of Systems  Constitutional: positive for fatigue Eyes: negative Ears, nose, mouth, throat, and face: negative Respiratory: positive for dyspnea on exertion Cardiovascular: negative Gastrointestinal: negative Genitourinary:negative Integument/breast: negative Hematologic/lymphatic: negative Musculoskeletal:negative Neurological: negative Behavioral/Psych: negative Endocrine: negative Allergic/Immunologic: negative  Physical Exam  WGN:FAOZH, healthy, no distress, well nourished, well developed and anxious SKIN: skin color, texture, turgor are normal, no rashes or significant lesions HEAD: Normocephalic, No masses, lesions, tenderness or abnormalities EYES: normal, PERRLA, Conjunctiva are pink and non-injected EARS: External ears normal, Canals clear OROPHARYNX:no exudate, no erythema and lips, buccal mucosa, and tongue normal  NECK: supple, no adenopathy, no JVD LYMPH:  no palpable lymphadenopathy, no hepatosplenomegaly BREAST:not examined LUNGS: coarse sounds heard, decreased breath sounds HEART: regular rate & rhythm, no murmurs and no gallops ABDOMEN:abdomen soft, non-tender, normal bowel sounds and no masses or organomegaly BACK: No CVA tenderness, Range of motion is normal EXTREMITIES:no joint deformities, effusion, or inflammation, no edema  NEURO: alert & oriented x 3 with fluent speech, no focal motor/sensory  deficits  PERFORMANCE STATUS: ECOG 1  LABORATORY DATA: Lab Results  Component Value Date   WBC 7.2 01/13/2020   HGB 15.0 01/13/2020   HCT 47.7 (H) 01/13/2020   MCV 93.7 01/13/2020   PLT 237 01/13/2020      Chemistry      Component Value Date/Time   NA 141 10/02/2019 1324   NA 138 03/14/2018 1619   NA 142 12/23/2014 0829   K 3.3 (L) 10/02/2019 1324   K 3.9 12/23/2014 0829   CL 104 10/02/2019 1324   CO2 22 10/02/2019 1324   CO2 26 12/23/2014 0829   BUN 18 10/02/2019 1324   BUN 13 03/14/2018 1619   BUN 7.2 12/23/2014 0829   CREATININE 1.09 (H) 10/02/2019 1324   CREATININE 0.7 12/23/2014 0829      Component Value Date/Time   CALCIUM 9.9  10/02/2019 1324   CALCIUM 9.3 12/23/2014 0829   ALKPHOS 84 02/12/2019 0255   ALKPHOS 98 12/23/2014 0829   AST 23 02/12/2019 0255   AST 20 12/23/2014 0829   ALT 15 02/12/2019 0255   ALT 20 12/23/2014 0829   BILITOT 0.5 02/12/2019 0255   BILITOT 0.69 12/23/2014 0829       RADIOGRAPHIC STUDIES: CUP PACEART REMOTE DEVICE CHECK  Result Date: 01/05/2020 Scheduled remote reviewed.  Normal device function.  Next remote 91 days.  [place]   ASSESSMENT: This is a very pleasant 59 years old African-American female with history of limited stage small cell lung cancer diagnosed in March 2015 status post short course of radiotherapy as well as 6 cycles of systemic chemotherapy with cisplatin and etoposide followed by prophylactic cranial irradiation. The patient was lost to follow-up for the last 5 years  PLAN: I had a lengthy discussion with the patient today about her condition.  Unfortunately she has no imaging studies for the last few years. I recommended for the patient to have repeat CT scan of the chest, abdomen pelvis for restaging of her disease. I will see the patient back for follow-up visit in 2 weeks for reevaluation and discussion of her scan results and further recommendation regarding her condition. For the congestive heart  failure and heart disease, she will continue her routine follow-up visit and evaluation by Dr. [name]. The patient was advised to call immediately if she has any concerning symptoms in the interval. The patient voices understanding of current disease status and treatment options and is in agreement with the current care plan.  All questions were answered. The patient knows to call the clinic with any problems, questions or concerns. We can certainly see the patient much sooner if necessary.  Thank you so much for allowing me to participate in the care of Brittany [name]. I will continue to follow up the patient with you and assist in her care. The total time spent in the appointment was 70 minutes.  Disclaimer: This note was dictated with voice recognition software. Similar sounding words can inadvertently be transcribed and may not be corrected upon review.   Eilleen Kempf January 13, 2020, 2:35 PM

## 2020-01-13 NOTE — Telephone Encounter (Signed)
Scheduled per 02/02 los, patient will be notified by My chart.

## 2020-01-19 ENCOUNTER — Other Ambulatory Visit (HOSPITAL_COMMUNITY): Payer: Self-pay

## 2020-01-19 MED ORDER — TICAGRELOR 90 MG PO TABS: 90.0000 mg | ORAL_TABLET | Freq: Two times a day (BID) | ORAL | 1 refills | Status: DC

## 2020-01-20 ENCOUNTER — Telehealth (HOSPITAL_COMMUNITY): Payer: Self-pay | Admitting: Licensed Clinical Social Worker

## 2020-01-20 NOTE — Telephone Encounter (Signed)
CSW called pt as she was on our [employer] but hadn't been seen in sometime as she was in [place] for awhile.  Pt reports she is back in this area but is living with a niece in [place], [place] so she would not be eligible for [employer] at this time- removed from list.  CSW also inquired about pt needing to get back in for an appt as her last visit was in October and she was supposed to have follow up in 2 months following that- pt agreeable- CSW sent messaged to clinic scheduler to follow up.  CSW will continue to follow through clinic and assist as needed  Jorge Ny, [facility] Desk#: [phone] Cell#: [phone]

## 2020-01-27 ENCOUNTER — Encounter (HOSPITAL_COMMUNITY): Payer: Self-pay

## 2020-01-27 ENCOUNTER — Encounter: Payer: Self-pay | Admitting: Internal Medicine

## 2020-01-27 ENCOUNTER — Inpatient Hospital Stay (HOSPITAL_BASED_OUTPATIENT_CLINIC_OR_DEPARTMENT_OTHER): Payer: BC Managed Care – PPO | Admitting: Internal Medicine

## 2020-01-27 ENCOUNTER — Other Ambulatory Visit: Payer: Self-pay

## 2020-01-27 ENCOUNTER — Ambulatory Visit (HOSPITAL_COMMUNITY)
Admission: RE | Admit: 2020-01-27 | Discharge: 2020-01-27 | Disposition: A | Discharge status: Home / Self Care | Payer: BC Managed Care – PPO | Source: Ambulatory Visit | Attending: Internal Medicine | Admitting: Internal Medicine

## 2020-01-27 VITALS — BP 119/76 | HR 107 | Temp 98.5°F | Resp 18 | Ht 59.0 in | Wt 113.1 lb

## 2020-01-27 DIAGNOSIS — C3490 Malignant neoplasm of unspecified part of unspecified bronchus or lung: Secondary | ICD-10-CM | POA: Insufficient documentation

## 2020-01-27 DIAGNOSIS — Z85118 Personal history of other malignant neoplasm of bronchus and lung: Secondary | ICD-10-CM | POA: Diagnosis not present

## 2020-01-27 DIAGNOSIS — I10 Essential (primary) hypertension: Secondary | ICD-10-CM

## 2020-01-27 MED ORDER — IOHEXOL 300 MG/ML  SOLN
75.0000 mL | Freq: Once | INTRAMUSCULAR | Status: AC | PRN
  Administered 2020-01-27: 75 mL via INTRAVENOUS

## 2020-01-27 MED ORDER — SODIUM CHLORIDE (PF) 0.9 % IJ SOLN
INTRAMUSCULAR | Status: AC
  Filled 2020-01-27: qty 50

## 2020-01-27 NOTE — Progress Notes (Signed)
[place] [phone]   [phone]  OFFICE PROGRESS NOTE  Scifres, Dorothy, PA-C [place] [address]  DIAGNOSIS: Limited stage small cell lung cancer diagnosed in March 2015   PRIOR THERAPY: status post short course of radiotherapy as well as 6 cycles of systemic chemotherapy with cisplatin and etoposide followed by prophylactic cranial irradiation.  CURRENT THERAPY: Observation.  INTERVAL HISTORY: Brittany [name] 59 y.o. female returns to the clinic today for follow-up visit.  The patient is feeling fine today with no concerning complaints except for shortness of breath with exertion.  She denied having any current chest pain, cough or hemoptysis.  She denied having any fever or chills.  She has no nausea, vomiting, diarrhea or constipation.  She has no headache or visual changes.  The patient had repeat CT scan of the chest, abdomen pelvis performed less than an hour ago and she is here for evaluation and discussion of her scan results and recommendation regarding her condition.  MEDICAL HISTORY: Past Medical History:  Diagnosis Date  . Acute systolic congestive heart failure ([place]) 02/03/2018  . Allergy   . Anxiety   . Asthma   . Hypertension   . PAF (paroxysmal atrial fibrillation) ([place])   . Prophylactic measure 08/03/14-08/19/14   Prophyl. cranial radiation 24 Gy  . 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 Small cell lung cancer ([place]) 03/16/2014    ALLERGIES:  is allergic to codeine.  MEDICATIONS:  Current Outpatient Medications  Medication Sig Dispense Refill  . albuterol (VENTOLIN HFA) 108 (90 Base) MCG/ACT inhaler Inhale 2 puffs into the lungs every 6 (six) hours as needed for wheezing or shortness of breath. (Patient not taking: Reported on 01/13/2020) 1 Inhaler 0  . ALPRAZolam  (XANAX) 0.25 MG tablet Take 1 tablet (0.25 mg total) by mouth daily as needed for anxiety. 10 tablet 0  . aspirin 81 MG EC tablet Take 1 tablet (81 mg total) by mouth daily. 30 tablet 1  . atorvastatin (LIPITOR) 80 MG tablet Take 1 tablet (80 mg total) by mouth daily at 6 PM. 30 tablet 1  . ipratropium-albuterol (DUONEB) 0.5-2.5 (3) MG/3ML SOLN Take 3 mLs by nebulization 2 (two) times daily. 360 mL 0  . ivabradine (CORLANOR) 7.5 MG TABS tablet Take 1 tablet (7.5 mg total) by mouth 2 (two) times daily with a meal. 60 tablet 6  . nitroGLYCERIN (NITROSTAT) 0.4 MG SL tablet Place 1 tablet (0.4 mg total) under the tongue every 5 (five) minutes as needed for chest pain. (Patient not taking: Reported on 01/13/2020) 90 tablet 5  . oxyCODONE (OXY IR/ROXICODONE) 5 MG immediate release tablet Take 5 mg by mouth every 6 (six) hours as needed for moderate pain.   0  . potassium chloride SA (K-DUR,KLOR-CON) 20 MEQ tablet Take 1 tablet (20 mEq total) by mouth daily. 90 tablet 3  . sertraline (ZOLOFT) 50 MG tablet Take 1 tablet (50 mg total) by mouth daily. 30 tablet 2  . spironolactone (ALDACTONE) 25 MG tablet Take 1 tablet (25 mg total) by mouth daily. 30 tablet 6  . ticagrelor (BRILINTA) 90 MG TABS tablet Take 1 tablet (90 mg total) by mouth 2 (two) times daily. 60 tablet 1  . torsemide (DEMADEX) 20 MG tablet Take 3 tablets (60 mg total) by  mouth daily. Take additional as directed by HF team. Take 70mg  (3.5 tabs) on Monday and Friday only (Patient taking differently: Take 60 mg by mouth daily. ) 310 tablet 3   No current facility-administered medications for this visit.   Facility-Administered Medications Ordered in Other Visits  Medication Dose Route Frequency Provider Last Rate Last Admin  . sodium chloride (PF) 0.9 % injection             SURGICAL HISTORY:  Past Surgical History:  Procedure Laterality Date  . CARDIAC DEFIBRILLATOR PLACEMENT  08/15/2018   MDT Visia AF MRI VR ICD implanted by Dr Loel Lofty  for primary prevention of sudden  . CESAREAN SECTION    . CORONARY ARTERY BYPASS GRAFT N/A 02/03/2018   Procedure: CORONARY ARTERY BYPASS GRAFTING (CABG);  Surgeon: Rexene Alberts, MD;  Location: [facility];  Service: Open Heart Surgery;  Laterality: N/A;  Time 3 using left internal mammary artery and endoscopically harvested right saphenous vein  . CORONARY BALLOON ANGIOPLASTY N/A 02/03/2018   Procedure: CORONARY BALLOON ANGIOPLASTY;  Surgeon: Martinique, Peter M, MD;  Location: [facility];  Service: Cardiovascular;  Laterality: N/A;  . CORONARY STENT INTERVENTION N/A 02/12/2019   Procedure: CORONARY STENT INTERVENTION;  Surgeon: Wellington Hampshire, MD;  Location: [facility];  Service: Cardiovascular;  Laterality: N/A;  . CORONARY/GRAFT ACUTE MI REVASCULARIZATION N/A 02/03/2018   Procedure: Coronary/Graft Acute MI Revascularization;  Surgeon: Martinique, Peter M, MD;  Location: [facility];  Service: Cardiovascular;  Laterality: N/A;  . IABP INSERTION N/A 02/03/2018   Procedure: IABP Insertion;  Surgeon: Martinique, Peter M, MD;  Location: [facility];  Service: Cardiovascular;  Laterality: N/A;  . INTRAOPERATIVE TRANSESOPHAGEAL ECHOCARDIOGRAM N/A 02/03/2018   Procedure: INTRAOPERATIVE TRANSESOPHAGEAL ECHOCARDIOGRAM;  Surgeon: Rexene Alberts, MD;  Location: [facility];  Service: Open Heart Surgery;  Laterality: N/A;  . LEFT HEART CATH AND CORONARY ANGIOGRAPHY N/A 02/03/2018   Procedure: LEFT HEART CATH AND CORONARY ANGIOGRAPHY;  Surgeon: Martinique, Peter M, MD;  Location: [facility];  Service: Cardiovascular;  Laterality: N/A;  . LEFT HEART CATH AND CORS/GRAFTS ANGIOGRAPHY N/A 02/12/2019   Procedure: LEFT HEART CATH AND CORS/GRAFTS ANGIOGRAPHY;  Surgeon: Wellington Hampshire, MD;  Location: [facility];  Service: Cardiovascular;  Laterality: N/A;  . MEDIASTINOSCOPY N/A 03/11/2014   Procedure: MEDIASTINOSCOPY;  Surgeon: Melrose Nakayama, MD;  Location: [facility];  Service: Thoracic;   Laterality: N/A;  . PLACEMENT OF [place] LEFT VENTRICULAR ASSIST DEVICE  02/03/2018   Procedure: PLACEMENT OF [place] LEFT VENTRICULAR ASSIST DEVICE LD;  Surgeon: Rexene Alberts, MD;  Location: [facility];  Service: Open Heart Surgery;;  . REMOVAL OF IMPELLA LEFT VENTRICULAR ASSIST DEVICE N/A 02/08/2018   Procedure: REMOVAL OF [place] LEFT VENTRICULAR ASSIST DEVICE;  Surgeon: Rexene Alberts, MD;  Location: [facility];  Service: Open Heart Surgery;  Laterality: N/A;  . RIGHT HEART CATH N/A 02/03/2018   Procedure: RIGHT HEART CATH;  Surgeon: Martinique, Peter M, MD;  Location: [facility];  Service: Cardiovascular;  Laterality: N/A;  . RIGHT HEART CATH N/A 05/09/2018   Procedure: RIGHT HEART CATH;  Surgeon: Jolaine Artist, MD;  Location: [facility];  Service: Cardiovascular;  Laterality: N/A;  . TEE WITHOUT CARDIOVERSION N/A 02/08/2018   Procedure: TRANSESOPHAGEAL ECHOCARDIOGRAM (TEE);  Surgeon: Rexene Alberts, MD;  Location: [facility];  Service: Open Heart Surgery;  Laterality: N/A;  . TUBAL LIGATION    . VIDEO  BRONCHOSCOPY WITH ENDOBRONCHIAL ULTRASOUND N/A 03/11/2014   Procedure: VIDEO BRONCHOSCOPY WITH ENDOBRONCHIAL ULTRASOUND;  Surgeon: Melrose Nakayama, MD;  Location: [facility];  Service: Thoracic;  Laterality: N/A;    REVIEW OF SYSTEMS:  A comprehensive review of systems was negative except for: Constitutional: positive for fatigue Respiratory: positive for dyspnea on exertion   PHYSICAL EXAMINATION: General appearance: alert, cooperative, fatigued and no distress Head: Normocephalic, without obvious abnormality, atraumatic Neck: no adenopathy, no JVD, supple, symmetrical, trachea midline and thyroid not enlarged, symmetric, no tenderness/mass/nodules Lymph nodes: Cervical, supraclavicular, and axillary nodes normal. Resp: rales bilaterally Back: symmetric, no curvature. ROM normal. No CVA tenderness. Cardio: regular rate and rhythm, S1, S2 normal, no murmur, click, rub or gallop GI:  soft, non-tender; bowel sounds normal; no masses,  no organomegaly Extremities: extremities normal, atraumatic, no cyanosis or edema  ECOG PERFORMANCE STATUS: 1 - Symptomatic but completely ambulatory  Blood pressure 119/76, pulse (!) 107, temperature 98.5 F (36.9 C), temperature source Temporal, resp. rate 18, height 4\' 11"  (1.499 m), weight 113 lb 1.6 oz (51.3 kg), SpO2 98 %.  LABORATORY DATA: Lab Results  Component Value Date   WBC 7.2 01/13/2020   HGB 15.0 01/13/2020   HCT 47.7 (H) 01/13/2020   MCV 93.7 01/13/2020   PLT 237 01/13/2020      Chemistry      Component Value Date/Time   NA 143 01/13/2020 1406   NA 138 03/14/2018 1619   NA 142 12/23/2014 0829   K 3.7 01/13/2020 1406   K 3.9 12/23/2014 0829   CL 102 01/13/2020 1406   CO2 30 01/13/2020 1406   CO2 26 12/23/2014 0829   BUN 20 01/13/2020 1406   BUN 13 03/14/2018 1619   BUN 7.2 12/23/2014 0829   CREATININE 1.19 (H) 01/13/2020 1406   CREATININE 0.7 12/23/2014 0829      Component Value Date/Time   CALCIUM 10.0 01/13/2020 1406   CALCIUM 9.3 12/23/2014 0829   ALKPHOS 118 01/13/2020 1406   ALKPHOS 98 12/23/2014 0829   AST 18 01/13/2020 1406   AST 20 12/23/2014 0829   ALT 10 01/13/2020 1406   ALT 20 12/23/2014 0829   BILITOT 0.8 01/13/2020 1406   BILITOT 0.69 12/23/2014 0829       RADIOGRAPHIC STUDIES: CUP PACEART REMOTE DEVICE CHECK  Result Date: 01/05/2020 Scheduled remote reviewed.  Normal device function.  Next remote 91 days.  [place]   ASSESSMENT AND PLAN: This is a very pleasant 59 years old African-American female with history of limited stage small cell lung cancer diagnosed in March 2015 status post short course of radiotherapy followed by 6 cycles of systemic chemotherapy with cisplatin and 2 etoposide followed by prophylactic cranial irradiation. The patient has been in observation since that time and she has been feeling fine. She had repeat CT scan of the chest, abdomen pelvis performed few  hours ago. I personally and independently reviewed the scan images and discussed it with the patient today.  I do not see any concerning findings for disease recurrence in the chest but there was some hypodensities in the liver concerning for cysts but metastatic disease could not be excluded.  I will wait for the final report from radiology for confirmation. If no concerning findings for recurrence or metastasis on the final radiology report, I will see the patient back for follow-up visit in 1 year with repeat CT scan of the chest for restaging of her disease. The patient was advised to take her congestive heart  failure medication because of the bilateral crackles.  She is followed by Dr. [name]. She was advised to call immediately if she has any concerning symptoms in the interval. The patient voices understanding of current disease status and treatment options and is in agreement with the current care plan.  All questions were answered. The patient knows to call the clinic with any problems, questions or concerns. We can certainly see the patient much sooner if necessary.  Disclaimer: This note was dictated with voice recognition software. Similar sounding words can inadvertently be transcribed and may not be corrected upon review.

## 2020-01-28 ENCOUNTER — Telehealth: Payer: Self-pay | Admitting: Medical Oncology

## 2020-01-28 NOTE — Telephone Encounter (Signed)
Curt Bears, MD  Ardeen Garland, RN  Her scan is good. I will see her back in a year.

## 2020-01-29 ENCOUNTER — Telehealth: Payer: Self-pay | Admitting: Internal Medicine

## 2020-01-29 NOTE — Telephone Encounter (Signed)
Scheduled per los. Called and left msg. Mailed printout  °

## 2020-02-03 ENCOUNTER — Telehealth (HOSPITAL_COMMUNITY): Payer: Self-pay | Admitting: *Deleted

## 2020-02-03 NOTE — Telephone Encounter (Signed)
Received fax from [facility], NP at [facility], pt has been admitted to [facility] in [place] and they would like records (echo, cath, ekg, ov notes) to help them complete their consult and evaluation.  Records faxed to her at [phone]

## 2020-02-11 NOTE — Progress Notes (Signed)
No ICM remote transmission received for 01/26/2020 and next ICM transmission scheduled for 02/23/2020.

## 2020-02-19 ENCOUNTER — Other Ambulatory Visit (HOSPITAL_COMMUNITY): Payer: Self-pay | Admitting: Cardiology

## 2020-02-19 MED ORDER — IVABRADINE HCL 7.5 MG PO TABS: 7.5000 mg | ORAL_TABLET | Freq: Two times a day (BID) | ORAL | 6 refills | Status: DC

## 2020-02-23 ENCOUNTER — Ambulatory Visit (INDEPENDENT_AMBULATORY_CARE_PROVIDER_SITE_OTHER): Payer: BC Managed Care – PPO

## 2020-02-23 DIAGNOSIS — Z95810 Presence of automatic (implantable) cardiac defibrillator: Secondary | ICD-10-CM

## 2020-02-23 DIAGNOSIS — I5022 Chronic systolic (congestive) heart failure: Secondary | ICD-10-CM

## 2020-02-27 NOTE — Progress Notes (Signed)
EPIC Encounter for ICM Monitoring  Patient Name: Brittany [name] is a 59 y.o. female Date: 02/27/2020 Primary Care Physican: Maude Leriche, PA-C Primary Cardiologist:Bensimhon Electrophysiologist:Allred 02/27/2020 Weight: 110lbs   Spoke with patient and earlier this week she had some swelling of feet and weight gain of 2 lbs. She went to [facility] mid February due to fluid accumulation symptoms.  She thinks she was eating too much salt and drinking too much fluids.  OptivolThoracic impedance normal.   Prescribed: Torsemide20 mgTake 3 tablets (60 mg total) by mouth daily. Take additional as directed by HF team.   Labs: 10/02/2019 Creatinine 1.09, BUN 18, Potassium 3.3, Sodium 141, GFR 56->60 06/12/2019 Creatinine 1.01, BUN 11, Potassium 3.1, Sodium 141, GFR >60 04/21/2019 Creatinine 1.47, BUN 19, Potassium 3.5, Sodium 141, GFR 39-45 02/24/2019 Creatinine1.04, BUN16, Potassium3.6, Sodium141, GFR59->60, BNP 1,152.0 A complete set of results can be found in Results Review.  Recommendations:   Patient took extra Torsemide earlier this week due to fluid symptoms which have resolved.  No changes and encouraged to call if experiencing any fluid symptoms.  Follow-up plan: ICM clinic phone appointment on 03/29/2020.   91 day device clinic remote transmission 04/05/2020.   Advised patient she is due to make office appointments with Dr Rayann Heman and Dr [name].     Copy of ICM check sent to Dr. Rayann Heman.   3 month ICM trend: 02/23/2020    1 Year ICM trend:       [name], RN 02/27/2020 1:52 PM

## 2020-03-29 ENCOUNTER — Ambulatory Visit (INDEPENDENT_AMBULATORY_CARE_PROVIDER_SITE_OTHER): Payer: BC Managed Care – PPO

## 2020-03-29 DIAGNOSIS — Z95810 Presence of automatic (implantable) cardiac defibrillator: Secondary | ICD-10-CM

## 2020-03-29 DIAGNOSIS — I5022 Chronic systolic (congestive) heart failure: Secondary | ICD-10-CM | POA: Diagnosis not present

## 2020-03-31 NOTE — Progress Notes (Addendum)
EPIC Encounter for ICM Monitoring  Patient Name: Brittany [name] is a 59 y.o. female Date: 03/31/2020 Primary Care Physican: Scifres, [name], PA-C Primary Cardiologist:Bensimhon Electrophysiologist:Allred 4/21/2021Weight: 112 lbs   Spoke with patient and she started having some shortness of breath today and 2 lb weight gain.  She knows she has some fluid accumulation.   OptivolThoracic impedance suggesting possible fluid accumulation since 03/22/20.   Prescribed: Torsemide20 mgTake 3 tablets (60 mg total) by mouth daily. Take additional as directed by HF team.   Labs: 10/02/2019 Creatinine 1.09, BUN 18, Potassium 3.3, Sodium 141, GFR 56->60 06/12/2019 Creatinine 1.01, BUN 11, Potassium 3.1, Sodium 141, GFR >60 04/21/2019 Creatinine 1.47, BUN 19, Potassium 3.5, Sodium 141, GFR 39-45 02/24/2019 Creatinine1.04, BUN16, Potassium3.6, Sodium141, GFR59->60, BNP 1,152.0 A complete set of results can be found in Results Review.  Recommendations: Patient self adjusts Torsemide and took extra today and will take extra tomorrow.  Advised to call if condition worsens.   Follow-up plan: ICM clinic phone appointment on4/26/2021 to recheck fluid levels. 91 day device clinic remote transmission 04/05/2020. Advised patient to call Dr [name] because she is overdue to make an appointment.  Message sent to Dr Jackalyn Lombard scheduler to call patient because pt is overdue to make an appointment.    Copy of ICM check sent to Dr.Allred and Dr [name].  3 month ICM trend: 03/29/2020    1 Year ICM trend:       [name], RN 03/31/2020 2:58 PM

## 2020-04-05 ENCOUNTER — Ambulatory Visit (INDEPENDENT_AMBULATORY_CARE_PROVIDER_SITE_OTHER): Payer: BC Managed Care – PPO

## 2020-04-05 ENCOUNTER — Ambulatory Visit (INDEPENDENT_AMBULATORY_CARE_PROVIDER_SITE_OTHER): Payer: BC Managed Care – PPO | Admitting: *Deleted

## 2020-04-05 DIAGNOSIS — Z95810 Presence of automatic (implantable) cardiac defibrillator: Secondary | ICD-10-CM

## 2020-04-05 DIAGNOSIS — I5022 Chronic systolic (congestive) heart failure: Secondary | ICD-10-CM

## 2020-04-05 DIAGNOSIS — I5023 Acute on chronic systolic (congestive) heart failure: Secondary | ICD-10-CM

## 2020-04-05 LAB — CUP PACEART REMOTE DEVICE CHECK
Battery Remaining Longevity: 126 mo
Battery Voltage: 3.03 V
Brady Statistic RV Percent Paced: 0.01 %
Date Time Interrogation Session: 20210426002204
HighPow Impedance: 65 Ohm
Implantable Lead Implant Date: 20190905
Implantable Lead Location: 753860
Implantable Pulse Generator Implant Date: 20190905
Lead Channel Impedance Value: 342 Ohm
Lead Channel Impedance Value: 437 Ohm
Lead Channel Pacing Threshold Amplitude: 0.875 V
Lead Channel Pacing Threshold Pulse Width: 0.4 ms
Lead Channel Sensing Intrinsic Amplitude: 22 mV
Lead Channel Sensing Intrinsic Amplitude: 22 mV
Lead Channel Setting Pacing Amplitude: 2 V
Lead Channel Setting Pacing Pulse Width: 0.4 ms
Lead Channel Setting Sensing Sensitivity: 0.3 mV

## 2020-04-05 NOTE — Progress Notes (Signed)
ICD Remote  

## 2020-04-07 NOTE — Progress Notes (Signed)
EPIC Encounter for ICM Monitoring  Patient Name: Brittany [name] is a 59 y.o. female Date: 04/07/2020 Primary Care Physican: [name], Brittany Server, PA-C Primary Cardiologist:Brittany [name] Electrophysiologist:Brittany [name] 4/28/2021Weight: 109 lbs   Spoke with patient and reports feeling well at this time.  Denies fluid symptoms.    OptivolThoracic impedance returned to normal after taking extra Torsemide.    Prescribed: Torsemide20 mgTake 3 tablets (60 mg total) by mouth daily.  She reports Dr Brittany [name] advised her to take extra Torsemide every other day when she has fluid symptoms.   Labs: 01/13/2020 Creatinine 1.19, BUN 20 Potassium 3.7, Sodium 143, GFR 50-58 A complete set of results can be found in Results Review.  Recommendations:No changes and encouraged to call if experiencing any fluid symptoms..   Follow-up plan: ICM clinic phone appointment on5/24/2021. 91 day device clinic remote transmission7/26/2021.Virtual visit with Dr Brittany [name] on 04/12/2020. Advised patient to call Dr Brittany [name] because she is overdue to make an appointment.    Copy of ICM check sent to Dr.Allred.  3 month ICM trend: 04/05/2020    1 Year ICM trend:       Brittany Billings, RN 04/07/2020 9:25 AM

## 2020-04-09 ENCOUNTER — Telehealth: Payer: Self-pay

## 2020-04-12 ENCOUNTER — Other Ambulatory Visit: Payer: Self-pay

## 2020-04-12 ENCOUNTER — Encounter: Payer: Self-pay | Admitting: Internal Medicine

## 2020-04-12 ENCOUNTER — Telehealth (INDEPENDENT_AMBULATORY_CARE_PROVIDER_SITE_OTHER): Payer: BC Managed Care – PPO | Admitting: Internal Medicine

## 2020-04-12 DIAGNOSIS — I480 Paroxysmal atrial fibrillation: Secondary | ICD-10-CM

## 2020-04-12 DIAGNOSIS — I5022 Chronic systolic (congestive) heart failure: Secondary | ICD-10-CM | POA: Diagnosis not present

## 2020-04-12 DIAGNOSIS — Z95810 Presence of automatic (implantable) cardiac defibrillator: Secondary | ICD-10-CM | POA: Insufficient documentation

## 2020-04-12 DIAGNOSIS — I255 Ischemic cardiomyopathy: Secondary | ICD-10-CM | POA: Diagnosis not present

## 2020-04-12 MED ORDER — SPIRONOLACTONE 25 MG PO TABS: 25.0000 mg | ORAL_TABLET | Freq: Every day | ORAL | 6 refills | Status: DC

## 2020-04-12 NOTE — Progress Notes (Signed)
Electrophysiology TeleHealth Note  Due to national recommendations of social distancing due to [place] 19, an audio telehealth visit is felt to be most appropriate for this patient at this time.  Verbal consent was obtained by me for the telehealth visit today.  The patient does not have capability for a virtual visit.  A phone visit is therefore required today.   Date:  04/12/2020   ID:  Brittany [name], DOB July 11, 1961, MRN [id number]  Location: patient's home  Provider location:  [place]  Evaluation Performed: Follow-up visit  PCP:  Scifres, [name], PA-C   Electrophysiologist:  Dr Rayann Heman  Chief Complaint:  palpitations  History of Present Illness:    Brittany [name] is a 59 y.o. female who presents via telehealth conferencing today.  Since last being seen in our clinic, the patient reports doing reasonably well. She continues to have episodes of SOB.  She has had several hospitalizations over the past year (primarily in [place] and [place] but also in [place]).  She is followed by the advanced heart failure team.  She has SOB with activity.  Today, she denies symptoms of palpitations, chest pain,  lower extremity edema, dizziness, presyncope, or syncope.  The patient is otherwise without complaint today.     Past Medical History:  Diagnosis Date  . Acute systolic congestive heart failure ([place]) 02/03/2018  . Allergy   . Anxiety   . Asthma   . Hypertension   . PAF (paroxysmal atrial fibrillation) ([place])   . Prophylactic measure 08/03/14-08/19/14   Prophyl. cranial radiation 24 Gy  . 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 Small cell lung cancer ([place]) 03/16/2014    Past Surgical History:  Procedure Laterality Date  . CARDIAC DEFIBRILLATOR PLACEMENT  08/15/2018   MDT Visia AF MRI VR ICD implanted by Dr Loel Lofty for primary prevention of sudden    . CESAREAN SECTION    . CORONARY ARTERY BYPASS GRAFT N/A 02/03/2018   Procedure: CORONARY ARTERY BYPASS GRAFTING (CABG);  Surgeon: Rexene Alberts, MD;  Location: [facility];  Service: Open Heart Surgery;  Laterality: N/A;  Time 3 using left internal mammary artery and endoscopically harvested right saphenous vein  . CORONARY BALLOON ANGIOPLASTY N/A 02/03/2018   Procedure: CORONARY BALLOON ANGIOPLASTY;  Surgeon: Martinique, Peter M, MD;  Location: [facility];  Service: Cardiovascular;  Laterality: N/A;  . CORONARY STENT INTERVENTION N/A 02/12/2019   Procedure: CORONARY STENT INTERVENTION;  Surgeon: Wellington Hampshire, MD;  Location: [facility];  Service: Cardiovascular;  Laterality: N/A;  . CORONARY/GRAFT ACUTE MI REVASCULARIZATION N/A 02/03/2018   Procedure: Coronary/Graft Acute MI Revascularization;  Surgeon: Martinique, Peter M, MD;  Location: [facility];  Service: Cardiovascular;  Laterality: N/A;  . IABP INSERTION N/A 02/03/2018   Procedure: IABP Insertion;  Surgeon: Martinique, Peter M, MD;  Location: [facility];  Service: Cardiovascular;  Laterality: N/A;  . INTRAOPERATIVE TRANSESOPHAGEAL ECHOCARDIOGRAM N/A 02/03/2018   Procedure: INTRAOPERATIVE TRANSESOPHAGEAL ECHOCARDIOGRAM;  Surgeon: Rexene Alberts, MD;  Location: [facility];  Service: Open Heart Surgery;  Laterality: N/A;  . LEFT HEART CATH AND CORONARY ANGIOGRAPHY N/A 02/03/2018   Procedure: LEFT HEART CATH AND CORONARY ANGIOGRAPHY;  Surgeon: Martinique, Peter M, MD;  Location: [facility];  Service: Cardiovascular;  Laterality: N/A;  . LEFT HEART CATH AND CORS/GRAFTS ANGIOGRAPHY  N/A 02/12/2019   Procedure: LEFT HEART CATH AND CORS/GRAFTS ANGIOGRAPHY;  Surgeon: Wellington Hampshire, MD;  Location: [facility];  Service: Cardiovascular;  Laterality: N/A;  . MEDIASTINOSCOPY N/A 03/11/2014   Procedure: MEDIASTINOSCOPY;  Surgeon: Melrose Nakayama, MD;  Location: [facility];  Service: Thoracic;  Laterality: N/A;  . PLACEMENT OF  [place] LEFT VENTRICULAR ASSIST DEVICE  02/03/2018   Procedure: PLACEMENT OF [place] LEFT VENTRICULAR ASSIST DEVICE LD;  Surgeon: Rexene Alberts, MD;  Location: [facility];  Service: Open Heart Surgery;;  . REMOVAL OF IMPELLA LEFT VENTRICULAR ASSIST DEVICE N/A 02/08/2018   Procedure: REMOVAL OF [place] LEFT VENTRICULAR ASSIST DEVICE;  Surgeon: Rexene Alberts, MD;  Location: [facility];  Service: Open Heart Surgery;  Laterality: N/A;  . RIGHT HEART CATH N/A 02/03/2018   Procedure: RIGHT HEART CATH;  Surgeon: Martinique, Peter M, MD;  Location: [facility];  Service: Cardiovascular;  Laterality: N/A;  . RIGHT HEART CATH N/A 05/09/2018   Procedure: RIGHT HEART CATH;  Surgeon: Jolaine Artist, MD;  Location: [facility];  Service: Cardiovascular;  Laterality: N/A;  . TEE WITHOUT CARDIOVERSION N/A 02/08/2018   Procedure: TRANSESOPHAGEAL ECHOCARDIOGRAM (TEE);  Surgeon: Rexene Alberts, MD;  Location: [facility];  Service: Open Heart Surgery;  Laterality: N/A;  . TUBAL LIGATION    . VIDEO BRONCHOSCOPY WITH ENDOBRONCHIAL ULTRASOUND N/A 03/11/2014   Procedure: VIDEO BRONCHOSCOPY WITH ENDOBRONCHIAL ULTRASOUND;  Surgeon: Melrose Nakayama, MD;  Location: [facility];  Service: Thoracic;  Laterality: N/A;    Current Outpatient Medications  Medication Sig Dispense Refill  . albuterol (VENTOLIN HFA) 108 (90 Base) MCG/ACT inhaler Inhale 2 puffs into the lungs every 6 (six) hours as needed for wheezing or shortness of breath. (Patient not taking: Reported on 01/13/2020) 1 Inhaler 0  . ALPRAZolam (XANAX) 0.25 MG tablet Take 1 tablet (0.25 mg total) by mouth daily as needed for anxiety. 10 tablet 0  . aspirin 81 MG EC tablet Take 1 tablet (81 mg total) by mouth daily. 30 tablet 1  . atorvastatin (LIPITOR) 80 MG tablet Take 1 tablet (80 mg total) by mouth daily at 6 PM. 30 tablet 1  . ipratropium-albuterol (DUONEB) 0.5-2.5 (3) MG/3ML SOLN Take 3 mLs by nebulization 2 (two) times daily. 360 mL 0  . ivabradine (CORLANOR) 7.5  MG TABS tablet Take 1 tablet (7.5 mg total) by mouth 2 (two) times daily with a meal. 60 tablet 6  . nitroGLYCERIN (NITROSTAT) 0.4 MG SL tablet Place 1 tablet (0.4 mg total) under the tongue every 5 (five) minutes as needed for chest pain. (Patient not taking: Reported on 01/13/2020) 90 tablet 5  . oxyCODONE (OXY IR/ROXICODONE) 5 MG immediate release tablet Take 5 mg by mouth every 6 (six) hours as needed for moderate pain.   0  . potassium chloride SA (K-DUR,KLOR-CON) 20 MEQ tablet Take 1 tablet (20 mEq total) by mouth daily. 90 tablet 3  . sertraline (ZOLOFT) 50 MG tablet Take 1 tablet (50 mg total) by mouth daily. 30 tablet 2  . spironolactone (ALDACTONE) 25 MG tablet Take 1 tablet (25 mg total) by mouth daily. (Patient not taking: Reported on 04/12/2020) 30 tablet 6  . ticagrelor (BRILINTA) 90 MG TABS tablet Take 1 tablet (90 mg total) by mouth 2 (two) times daily. 60 tablet 1  . torsemide (DEMADEX) 20 MG tablet Take 3 tablets (60 mg total) by mouth daily. Take additional as directed by HF team. Take 70mg  (3.5 tabs) on  Monday and Friday only (Patient taking differently: Take 60 mg by mouth daily. ) 310 tablet 3   No current facility-administered medications for this visit.    Allergies:   Codeine   Social History:  The patient  reports that she quit smoking about 6 years ago. Her smoking use included cigarettes. She has a 20.00 pack-year smoking history. She has never used smokeless tobacco. She reports current alcohol use of about 8.0 standard drinks of alcohol per week. She reports current drug use. Drug: Marijuana.   ROS:  Please see the history of present illness.   All other systems are personally reviewed and negative.    Exam:    Vital Signs:  There were no vitals taken for this visit.  Well sounding, alert and conversant   Labs/Other Tests and Data Reviewed:    Recent Labs: 10/02/2019: B Natriuretic Peptide 1,083.5 01/13/2020: ALT 10; BUN 20; Creatinine 1.19; Hemoglobin 15.0;  Platelet Count 237; Potassium 3.7; Sodium 143   Wt Readings from Last 3 Encounters:  01/27/20 113 lb 1.6 oz (51.3 kg)  01/13/20 110 lb 6.4 oz (50.1 kg)  10/02/19 104 lb (47.2 kg)     Last device remote is reviewed from [facility] which reveals normal device function    ASSESSMENT & PLAN:    1.  Ischemic CM/ CAD No ischemic symptoms She has chronic NYHA Class IIIb CHF.  Follows in advanced heart failure clinic.  Previously seen at [facility] and felt to not b Remotes are up to date.  Normal ICD function QRS < 130 msec, not a CRTcandidate I will reach out to Dr [name] to see if we could evaluate for barostim activation therapy.  2. paroxysmal atrial fibrillation Well controlled Given low burden (followed by Visia AF device), she is on ASA and Brilinta.  Hopefully, we could consider stopping brlinita and restarting [place] therapy now that she is a year post revascularization.  I will defer to Dr [name].   3. H/o SCLC Follows with Dr Rogue Jury  Follow-up:  EP PA in 6 months CHF clinic as scheduled   Patient Risk:  after full review of this patients clinical status, I feel that they are at moderate risk at this time.  Today, I have spent 15 minutes with the patient with telehealth technology discussing arrhythmia management .    Army Fossa, MD  04/12/2020 9:11 AM     [facility] [address] [place] [address] [phone] (office) [phone] (fax)

## 2020-05-03 ENCOUNTER — Ambulatory Visit (INDEPENDENT_AMBULATORY_CARE_PROVIDER_SITE_OTHER): Payer: BC Managed Care – PPO

## 2020-05-03 DIAGNOSIS — Z95810 Presence of automatic (implantable) cardiac defibrillator: Secondary | ICD-10-CM

## 2020-05-03 DIAGNOSIS — I5022 Chronic systolic (congestive) heart failure: Secondary | ICD-10-CM | POA: Diagnosis not present

## 2020-05-04 ENCOUNTER — Telehealth: Payer: Self-pay

## 2020-05-04 NOTE — Telephone Encounter (Signed)
Unable to speak  with patient to remind of missed remote transmission 

## 2020-05-07 NOTE — Progress Notes (Signed)
EPIC Encounter for ICM Monitoring  Patient Name: Brittany [name] is a 60 y.o. female Date: 05/07/2020 Primary Care Physican: Maude Leriche, PA-C Primary Cardiologist:Bensimhon Electrophysiologist:Allred 5/28/2021Weight: 109 -110lbs   Spoke with patient and reports feeling well at this time.  Denies fluid symptoms.    OptivolThoracic impedancenormal.    Prescribed: Torsemide20 mgTake 3 tablets (60 mg total) by mouth daily.  She reports Dr [name] advised her to take extra Torsemide every other day when she has fluid symptoms.   Labs: 01/13/2020 Creatinine 1.19, BUN 20 Potassium 3.7, Sodium 143, GFR 50-58 A complete set of results can be found in Results Review.  Recommendations:No changes and encouraged to call if experiencing any fluid symptoms..   Follow-up plan: ICM clinic phone appointment on6/28/2021. 91 day device clinic remote transmission7/26/2021. Advised patientto callDr Bensimhon because she is overdue to make an appointment.   Copy of ICM check sent to Dr.Allred.  3 month ICM trend: 05/04/2020    1 Year ICM trend:       [name], RN 05/07/2020 3:06 PM

## 2020-05-17 ENCOUNTER — Other Ambulatory Visit: Payer: Self-pay

## 2020-05-17 ENCOUNTER — Ambulatory Visit (HOSPITAL_COMMUNITY)
Admission: RE | Admit: 2020-05-17 | Discharge: 2020-05-17 | Disposition: A | Discharge status: Home / Self Care | Payer: BC Managed Care – PPO | Source: Ambulatory Visit | Attending: Cardiology | Admitting: Cardiology

## 2020-05-17 ENCOUNTER — Encounter (HOSPITAL_COMMUNITY): Payer: Self-pay | Admitting: Cardiology

## 2020-05-17 DIAGNOSIS — I25119 Atherosclerotic heart disease of native coronary artery with unspecified angina pectoris: Secondary | ICD-10-CM

## 2020-05-17 DIAGNOSIS — I739 Peripheral vascular disease, unspecified: Secondary | ICD-10-CM

## 2020-05-17 DIAGNOSIS — I5022 Chronic systolic (congestive) heart failure: Secondary | ICD-10-CM | POA: Diagnosis not present

## 2020-05-17 MED ORDER — TICAGRELOR 90 MG PO TABS: 90.0000 mg | ORAL_TABLET | Freq: Two times a day (BID) | ORAL | 6 refills | Status: DC

## 2020-05-17 NOTE — Patient Instructions (Signed)
Labs needed in 7-10 days  Your physician has requested that you have an ankle brachial index (ABI). During this test an ultrasound and blood pressure cuff are used to evaluate the arteries that supply the arms and legs with blood. Allow thirty minutes for this exam. There are no restrictions or special instructions.  Your physician has requested that you have a lower or upper extremity arterial duplex. This test is an ultrasound of the arteries in the legs or arms. It looks at arterial blood flow in the legs and arms. Allow one hour for Lower and Upper Arterial scans. There are no restrictions or special instructions   Your physician recommends that you schedule a follow-up appointment in: 7-10 days for nurse visit (bp check/ekg)  Your physician recommends that you schedule a follow-up appointment in: 3 months  At the [facility], you and your health needs are our priority. As part of our continuing mission to provide you with exceptional heart care, we have created designated Provider Care Teams. These Care Teams include your primary Cardiologist (physician) and Advanced Practice Providers (APPs- Physician Assistants and Nurse Practitioners) who all work together to provide you with the care you need, when you need it.   You may see any of the following providers on your designated Care Team at your next follow up: Marland Kitchen Dr Glori Bickers . Dr Loralie Champagne . Darrick Grinder, NP . Lyda Jester, PA . Audry Riles, PharmD   Please be sure to bring in all your medications bottles to every appointment.

## 2020-05-17 NOTE — Progress Notes (Signed)
Heart Failure TeleHealth Note  Due to national recommendations of social distancing due to [place] 19, Audio/video telehealth visit is felt to be most appropriate for this patient at this time.  See MyChart message from today for patient consent regarding telehealth for [name].  Date:  05/17/2020   ID:  Brittany [name], DOB [birth year], MRN [id number]  Location: Home  Provider location: [facility] Type of Visit: Established patient  PCP:  Scifres, [name], PA-C  Cardiologist:  Glori Bickers, MD Primary HF: Dr. [name]   Chief Complaint/ Reason for Visit:  F/u for chronic systolic heart failure    History of Present Illness: Brittany [name] is a 59 y.o. female with a history of CAD, chronic systolic heart failure and CHF.   She  presents via Engineer, civil (consulting) for a telehealth visit today. She was initially scheduled for office visit today but due to ear pain, she did not feel well enough to come in and she called and requested virtual visit instead.    she denies symptoms worrisome for COVID 19.   Brittany [name] a 59 y.o.femalewith a history of PAF,preveious smoker quit 5 years ago, hypertension, previous small cell lung cancer treated with chemo, chest XRT and prophylactic brain radiation in 2015, CAD s/p CABG and chronic systolic HF EF ~38%   Admitted 02/03/2018 with NSTEMI and shock. Underwent emergent cath 02/03/18 showed LAD 100% stenosed, LCx 95% stenosed. Taken for emergent CABG 02/03/18. Required impella post op. Hospital course complicated by cardiogenic shock, HCAP, A fib, respiratory failure, and swallowing issues. She was discharged to SNF. Discharge weight 103 pounds.   In 2019 had multiple hospitalizations for HF and pleural effusion. Underwent pleurodesis at [name].  Admitted 3/4 - 02/14/2019 with NSTEMIand HF. Received DES to ostial ramus into distal left main based on cath belowand underwent  diuresis. Meds adjusted as tolerated. Echo with EF 25-30%  Today, she reports she has done well from a volume standpoint. Complaint w/ meds and daily wts. Her wt has been stable between 109>>110 lb. She knows to take an extra torsemide if she gains more > 3 lb in 1 day but has not had to do so recently. Does ok w/ basic ALDs. No dyspnea w/ mild activities. Notes mild dyspnea with more moderate level activities. No LEE, orthopnea or PND. No ICD shocks. Denies CP. She remains on ASA and Brilinta but notes issues w/ bleeding. She bruises easily and also has had recent painless hematuria. She also reports symptoms concerning for PAD. Notes pain in legs w/ ambulation and feet/toes appear more darker/ discolored. Sensation is intact. Denies numbness and tingling. No resting pain. Former smoker. Dose not have BP monitor at home.   Past Medical History:  Diagnosis Date  . Acute systolic congestive heart failure ([place]) 02/03/2018  . Allergy   . Anxiety   . Asthma   . Hypertension   . PAF (paroxysmal atrial fibrillation) ([place])   . Prophylactic measure 08/03/14-08/19/14   Prophyl. cranial radiation 24 Gy  . 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 Small cell lung cancer ([place]) 03/16/2014   Past Surgical History:  Procedure Laterality Date  . CARDIAC DEFIBRILLATOR PLACEMENT  08/15/2018   MDT Visia AF MRI VR ICD implanted by Dr Loel Lofty for primary prevention of sudden  . CESAREAN SECTION    .  CORONARY ARTERY BYPASS GRAFT N/A 02/03/2018   Procedure: CORONARY ARTERY BYPASS GRAFTING (CABG);  Surgeon: Rexene Alberts, MD;  Location: [place];  Service: Open Heart Surgery;  Laterality: N/A;  Time 3 using left internal mammary artery and endoscopically harvested right saphenous vein  . CORONARY BALLOON ANGIOPLASTY N/A 02/03/2018   Procedure: CORONARY BALLOON ANGIOPLASTY;  Surgeon: Martinique, Peter M, MD;   Location: [place] [facility];  Service: Cardiovascular;  Laterality: N/A;  . CORONARY STENT INTERVENTION N/A 02/12/2019   Procedure: CORONARY STENT INTERVENTION;  Surgeon: Wellington Hampshire, MD;  Location: [place] [facility];  Service: Cardiovascular;  Laterality: N/A;  . CORONARY/GRAFT ACUTE MI REVASCULARIZATION N/A 02/03/2018   Procedure: Coronary/Graft Acute MI Revascularization;  Surgeon: Martinique, Peter M, MD;  Location: [place] [facility];  Service: Cardiovascular;  Laterality: N/A;  . IABP INSERTION N/A 02/03/2018   Procedure: IABP Insertion;  Surgeon: Martinique, Peter M, MD;  Location: [place] [facility];  Service: Cardiovascular;  Laterality: N/A;  . INTRAOPERATIVE TRANSESOPHAGEAL ECHOCARDIOGRAM N/A 02/03/2018   Procedure: INTRAOPERATIVE TRANSESOPHAGEAL ECHOCARDIOGRAM;  Surgeon: Rexene Alberts, MD;  Location: [place];  Service: Open Heart Surgery;  Laterality: N/A;  . LEFT HEART CATH AND CORONARY ANGIOGRAPHY N/A 02/03/2018   Procedure: LEFT HEART CATH AND CORONARY ANGIOGRAPHY;  Surgeon: Martinique, Peter M, MD;  Location: [place] [facility];  Service: Cardiovascular;  Laterality: N/A;  . LEFT HEART CATH AND CORS/GRAFTS ANGIOGRAPHY N/A 02/12/2019   Procedure: LEFT HEART CATH AND CORS/GRAFTS ANGIOGRAPHY;  Surgeon: Wellington Hampshire, MD;  Location: [place] [facility];  Service: Cardiovascular;  Laterality: N/A;  . MEDIASTINOSCOPY N/A 03/11/2014   Procedure: MEDIASTINOSCOPY;  Surgeon: Melrose Nakayama, MD;  Location: [place];  Service: Thoracic;  Laterality: N/A;  . PLACEMENT OF [place] LEFT VENTRICULAR ASSIST DEVICE  02/03/2018   Procedure: PLACEMENT OF [place] LEFT VENTRICULAR ASSIST DEVICE LD;  Surgeon: Rexene Alberts, MD;  Location: [place];  Service: Open Heart Surgery;;  . REMOVAL OF IMPELLA LEFT VENTRICULAR ASSIST DEVICE N/A 02/08/2018   Procedure: REMOVAL OF [place] LEFT VENTRICULAR ASSIST DEVICE;  Surgeon: Rexene Alberts, MD;  Location: [place];  Service: Open Heart Surgery;  Laterality: N/A;  . RIGHT  HEART CATH N/A 02/03/2018   Procedure: RIGHT HEART CATH;  Surgeon: Martinique, Peter M, MD;  Location: [place] [facility];  Service: Cardiovascular;  Laterality: N/A;  . RIGHT HEART CATH N/A 05/09/2018   Procedure: RIGHT HEART CATH;  Surgeon: Jolaine Artist, MD;  Location: [place] [facility];  Service: Cardiovascular;  Laterality: N/A;  . TEE WITHOUT CARDIOVERSION N/A 02/08/2018   Procedure: TRANSESOPHAGEAL ECHOCARDIOGRAM (TEE);  Surgeon: Rexene Alberts, MD;  Location: [place];  Service: Open Heart Surgery;  Laterality: N/A;  . TUBAL LIGATION    . VIDEO BRONCHOSCOPY WITH ENDOBRONCHIAL ULTRASOUND N/A 03/11/2014   Procedure: VIDEO BRONCHOSCOPY WITH ENDOBRONCHIAL ULTRASOUND;  Surgeon: Melrose Nakayama, MD;  Location: [place];  Service: Thoracic;  Laterality: N/A;     Current Outpatient Medications  Medication Sig Dispense Refill  . albuterol (VENTOLIN HFA) 108 (90 Base) MCG/ACT inhaler Inhale 2 puffs into the lungs every 6 (six) hours as needed for wheezing or shortness of breath. (Patient not taking: Reported on 01/13/2020) 1 Inhaler 0  . ALPRAZolam (XANAX) 0.25 MG tablet Take 1 tablet (0.25 mg total) by mouth daily as needed for anxiety. 10 tablet 0  . aspirin 81 MG EC tablet Take 1 tablet (81 mg total) by mouth daily. 30 tablet  1  . atorvastatin (LIPITOR) 80 MG tablet Take 1 tablet (80 mg total) by mouth daily at 6 PM. 30 tablet 1  . ipratropium-albuterol (DUONEB) 0.5-2.5 (3) MG/3ML SOLN Take 3 mLs by nebulization 2 (two) times daily. 360 mL 0  . ivabradine (CORLANOR) 7.5 MG TABS tablet Take 1 tablet (7.5 mg total) by mouth 2 (two) times daily with a meal. 60 tablet 6  . nitroGLYCERIN (NITROSTAT) 0.4 MG SL tablet Place 1 tablet (0.4 mg total) under the tongue every 5 (five) minutes as needed for chest pain. (Patient not taking: Reported on 01/13/2020) 90 tablet 5  . oxyCODONE (OXY IR/ROXICODONE) 5 MG immediate release tablet Take 5 mg by mouth every 6 (six) hours as needed for moderate pain.   0  .  potassium chloride SA (K-DUR,KLOR-CON) 20 MEQ tablet Take 1 tablet (20 mEq total) by mouth daily. 90 tablet 3  . sertraline (ZOLOFT) 50 MG tablet Take 1 tablet (50 mg total) by mouth daily. 30 tablet 2  . spironolactone (ALDACTONE) 25 MG tablet Take 1 tablet (25 mg total) by mouth daily. (Patient not taking: Reported on 04/12/2020) 30 tablet 6  . ticagrelor (BRILINTA) 90 MG TABS tablet Take 1 tablet (90 mg total) by mouth 2 (two) times daily. 60 tablet 1  . torsemide (DEMADEX) 20 MG tablet Take 3 tablets (60 mg total) by mouth daily. Take additional as directed by HF team. Take 70mg  (3.5 tabs) on Monday and Friday only (Patient taking differently: Take 60 mg by mouth daily. ) 310 tablet 3   No current facility-administered medications for this encounter.    Allergies:   Codeine   Social History:  The patient  reports that she quit smoking about 6 years ago. Her smoking use included cigarettes. She has a 20.00 pack-year smoking history. She has never used smokeless tobacco. She reports current alcohol use of about 8.0 standard drinks of alcohol per week. She reports current drug use. Drug: Marijuana.   Family History:  The patient's family history includes Cancer in her maternal grandmother; Diabetes in her paternal grandmother; Heart attack in her mother; Heart disease in her mother; Hypertension in her maternal grandmother and mother.   ROS:  Please see the history of present illness.   All other systems are personally reviewed and negative.   Exam:  (Video/Tele Health Call; Exam is subjective and or/visual.) General:  Speaks in full sentences. No resp difficulty. Lungs: Normal respiratory effort with conversation.  Abdomen: Non-distended per patient report Extremities: Pt denies edema. Neuro: Alert & oriented x 3.   Recent Labs: 10/02/2019: B Natriuretic Peptide 1,083.5 01/13/2020: ALT 10; BUN 20; Creatinine 1.19; Hemoglobin 15.0; Platelet Count 237; Potassium 3.7; Sodium 143  Personally  reviewed   Wt Readings from Last 3 Encounters:  01/27/20 51.3 kg (113 lb 1.6 oz)  01/13/20 50.1 kg (110 lb 6.4 oz)  10/02/19 47.2 kg (104 lb)      ASSESSMENT AND PLAN:    1. Chronicsystolic HF due to ICM. S/p Medtronic single chamber ICD. -Echo 09/04/18 ([facility]) LVEF 20%, Moderate AI, Mild MR, Mild TR, Severe LAE, RV mildly decreased.  - Echo 02/12/19: EF 25-30%, RV mildly reduced -Stable NYHA Class II-III. Per pt report, she checks wt daily and wt has been stable.  - Continue torsemide 60 mg daily. Continue to take an extra 20 mg PRN if > 3lb wt gain in 24 hrs.   - Continue spiro 25 mg qHS.  - Continue corlanor 7.5 bid - Consider low-dose  losartan in the future (has not been able to tolerate due to low BP). No BP monitor at home to check reading today - not on beta blocker w/ prior h/o low output  - plan RN visit later this week for EKG, BP check and labs  3. CAD  - Hx of NSTEMI/STEMI s/p Emergent CABG 02/03/18: Cath with severe 2v CAD as above with severe LV dysfunction/ICM. s/p Emergent CABG 02/03/18. - Had NSTEMI and now s/p LHC 02/12/19 with DES to the ostial ramus extending into the distal left main - She has completed a year of DAPT w/ ASA and Brilinta but recent abnormal bleeding. Will check CBC to r/o anemia. May need to reduce Brilinta to 60 mg bid or switch to Plavix.    4. H/o Recurrent pleural effusionss/p pleurodesis - She denies any significant dyspnea.    5. PAF - CHA2DS2/VASc is at least 4. (CHF, Vasc disease, HTN, Female).  - She has history of Afib RVR in the past in the setting of her Lung CA. Previously on Xarelto but stopped.  - Continue ASA and brilinta.  - Reviewed device interrogation from 04/05/20. No afib detection byICD  - Continue corlanor. Plan EKG when she reports to clinic for labs   6. 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  - Chest CT 02/19/18 with mass-like consolidation in R hilum concerning for recurrent tumor.  -  Repeat CT 03/27/2018 -No definite findings of locally recurrent tumor in the right lung. 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 Continued chest CT surveillance is advised in 3-6 months. - S/p thoracentesis 04/2018. Cytology negative for malignancy.  - CT chest 09/23/18 with slight increase in size of loculated right hydropneumothorax, status post thoracostomy tube removal. No evidence of tumor noted.  7. Abnormal bleeding - notes easy brushing and painless hematuria - on DAPT w/ ASA and Brilinta.  - check CBC this week. If anemia, depending on the degree, may need to change Brilinta to 60 mg bid or switch to Plavix. Now > 1 year post DES placement  - Check UA when she comes in for labs   8. Lower Extremity Claudication/ discoloration  - Per pt sensation is intact. Denies numbness and tingling. No resting pain. Former smoker. Known coronary disease - increased risk for PAD - order bilateral LE arterial dopplers w/ ABIs - she will come into clinic for labs on 6/10. Will examine feet and check pedal pulses at visit    COVID screen The patient does not have any symptoms that suggest any further testing/ screening at this time.  Social distancing reinforced today.  Patient Risk: After full review of this patients clinical status, I feel that they are at moderate risk for cardiac decompensation at this time.  Relevant cardiac medications were reviewed at length with the patient today. The patient does not have concerns regarding their medications at this time.   The following changes were made today:  None today  Recommended follow-up:  RN visit for BP check and EKG. Needs labs, BMP and CBC and UA. I plan to also assess briefly to do foot exam and check pedal pulses and make sure she is not showing any signs of potential low output HF. She has a prior h/o of this.   Today, I have spent 20  minutes with the patient with  telehealth technology discussing the above issues .    Signed, Lyda Jester, PA-C  05/17/2020 1:36 PM  [place] [id number] [phone] (office) [phone] (fax)

## 2020-05-20 ENCOUNTER — Ambulatory Visit (HOSPITAL_COMMUNITY)
Admission: RE | Admit: 2020-05-20 | Discharge: 2020-05-20 | Disposition: A | Discharge status: Home / Self Care | Payer: BC Managed Care – PPO | Source: Ambulatory Visit | Attending: Cardiology | Admitting: Cardiology

## 2020-05-20 ENCOUNTER — Encounter (HOSPITAL_COMMUNITY): Payer: Self-pay

## 2020-05-20 ENCOUNTER — Other Ambulatory Visit: Payer: Self-pay

## 2020-05-20 DIAGNOSIS — I5022 Chronic systolic (congestive) heart failure: Secondary | ICD-10-CM | POA: Insufficient documentation

## 2020-05-20 DIAGNOSIS — I25119 Atherosclerotic heart disease of native coronary artery with unspecified angina pectoris: Secondary | ICD-10-CM

## 2020-05-20 DIAGNOSIS — R9431 Abnormal electrocardiogram [ECG] [EKG]: Secondary | ICD-10-CM | POA: Insufficient documentation

## 2020-05-20 LAB — LIPID PANEL
Cholesterol: 264 mg/dL — ABNORMAL HIGH (ref 0–200)
HDL: 54 mg/dL (ref >40)
LDL Cholesterol: 189 mg/dL — ABNORMAL HIGH (ref 0–99)
Total CHOL/HDL Ratio: 4.9 RATIO
Triglycerides: 103 mg/dL (ref <150)
VLDL: 21 mg/dL (ref 0–40)

## 2020-05-20 LAB — BASIC METABOLIC PANEL
Anion gap: 14 (ref 5–15)
BUN: 11 mg/dL (ref 6–20)
CO2: 22 mmol/L (ref 22–32)
Calcium: 9.8 mg/dL (ref 8.9–10.3)
Chloride: 105 mmol/L (ref 98–111)
Creatinine, Ser: 1.11 mg/dL — ABNORMAL HIGH (ref 0.44–1.00)
GFR calc Af Amer: >60 mL/min (ref >60)
GFR calc non Af Amer: 54 mL/min — ABNORMAL LOW (ref >60)
Glucose, Bld: 117 mg/dL — ABNORMAL HIGH (ref 70–99)
Potassium: 3.2 mmol/L — ABNORMAL LOW (ref 3.5–5.1)
Sodium: 141 mmol/L (ref 135–145)

## 2020-05-20 LAB — HEPATIC FUNCTION PANEL
ALT: 14 U/L (ref 0–44)
AST: 21 U/L (ref 15–41)
Albumin: 4.1 g/dL (ref 3.5–5.0)
Alkaline Phosphatase: 107 U/L (ref 38–126)
Bilirubin, Direct: 0.2 mg/dL (ref 0.0–0.2)
Indirect Bilirubin: 1 mg/dL — ABNORMAL HIGH (ref 0.3–0.9)
Total Bilirubin: 1.2 mg/dL (ref 0.3–1.2)
Total Protein: 7.4 g/dL (ref 6.5–8.1)

## 2020-05-20 LAB — CBC
HCT: 52.3 % — ABNORMAL HIGH (ref 36.0–46.0)
Hemoglobin: 16.5 g/dL — ABNORMAL HIGH (ref 12.0–15.0)
MCH: 29.8 pg (ref 26.0–34.0)
MCHC: 31.5 g/dL (ref 30.0–36.0)
MCV: 94.6 fL (ref 80.0–100.0)
Platelets: 246 10*3/uL (ref 150–400)
RBC: 5.53 MIL/uL — ABNORMAL HIGH (ref 3.87–5.11)
RDW: 19.3 % — ABNORMAL HIGH (ref 11.5–15.5)
WBC: 8 10*3/uL (ref 4.0–10.5)
nRBC: 0 % (ref 0.0–0.2)

## 2020-05-20 NOTE — Progress Notes (Signed)
F/u from Virtual Visit on 6/7.   Pt presented for planned labs, UA and RN visit to check BP (see virtual visit note from 6/7 for more details).  She endorsed increased SOB w/ exertion on arrival. Tried increasing home diuretics yesterday w/o improvement in symptoms. UOP sluggish.   On my exam, she is dyspneic w/ conversation. O2 sats 98% on RA. RN walked pt around clinic w/ pulse oximetry. O2 sats remained stable at 96% on RA w/ ambulation. Wheezing noted on lung exam. No edema. ReDs clip elevated 53%. Notes cough. No fever or chills. Denies CP.   Will refer to Remote health for IV Lasix 80 mg bid x 3 days.   Check BMP and BNP today.  Remote health to follow BMP daily.  Return to clinic after IV Lasix to reassess volume status

## 2020-05-20 NOTE — Addendum Note (Signed)
Encounter addended by: Consuelo Pandy, PA-C on: 05/20/2020 5:50 PM  Actions taken: Clinical Note Signed

## 2020-05-20 NOTE — Progress Notes (Signed)
Reds Clip reading completed =54%

## 2020-05-21 ENCOUNTER — Inpatient Hospital Stay (HOSPITAL_COMMUNITY)
Admission: RE | Admit: 2020-05-21 | Discharge: 2020-05-24 | DRG: 291 | Disposition: A | Discharge status: Home / Self Care | Payer: BC Managed Care – PPO | Attending: Internal Medicine | Admitting: Internal Medicine

## 2020-05-21 ENCOUNTER — Emergency Department (HOSPITAL_COMMUNITY): Payer: BC Managed Care – PPO

## 2020-05-21 DIAGNOSIS — Z885 Allergy status to narcotic agent status: Secondary | ICD-10-CM

## 2020-05-21 DIAGNOSIS — Z20822 Contact with and (suspected) exposure to covid-19: Secondary | ICD-10-CM | POA: Diagnosis not present

## 2020-05-21 DIAGNOSIS — Z7902 Long term (current) use of antithrombotics/antiplatelets: Secondary | ICD-10-CM | POA: Diagnosis not present

## 2020-05-21 DIAGNOSIS — J9621 Acute and chronic respiratory failure with hypoxia: Secondary | ICD-10-CM | POA: Diagnosis not present

## 2020-05-21 DIAGNOSIS — R0602 Shortness of breath: Secondary | ICD-10-CM

## 2020-05-21 DIAGNOSIS — J441 Chronic obstructive pulmonary disease with (acute) exacerbation: Secondary | ICD-10-CM | POA: Diagnosis present

## 2020-05-21 DIAGNOSIS — Z85118 Personal history of other malignant neoplasm of bronchus and lung: Secondary | ICD-10-CM | POA: Diagnosis not present

## 2020-05-21 DIAGNOSIS — Z7982 Long term (current) use of aspirin: Secondary | ICD-10-CM | POA: Diagnosis not present

## 2020-05-21 DIAGNOSIS — I252 Old myocardial infarction: Secondary | ICD-10-CM

## 2020-05-21 DIAGNOSIS — I5043 Acute on chronic combined systolic (congestive) and diastolic (congestive) heart failure: Secondary | ICD-10-CM | POA: Diagnosis not present

## 2020-05-21 DIAGNOSIS — I2510 Atherosclerotic heart disease of native coronary artery without angina pectoris: Secondary | ICD-10-CM | POA: Diagnosis present

## 2020-05-21 DIAGNOSIS — Z79899 Other long term (current) drug therapy: Secondary | ICD-10-CM

## 2020-05-21 DIAGNOSIS — I5023 Acute on chronic systolic (congestive) heart failure: Secondary | ICD-10-CM | POA: Diagnosis present

## 2020-05-21 DIAGNOSIS — F419 Anxiety disorder, unspecified: Secondary | ICD-10-CM | POA: Diagnosis present

## 2020-05-21 DIAGNOSIS — Z923 Personal history of irradiation: Secondary | ICD-10-CM

## 2020-05-21 DIAGNOSIS — Z95810 Presence of automatic (implantable) cardiac defibrillator: Secondary | ICD-10-CM | POA: Diagnosis not present

## 2020-05-21 DIAGNOSIS — Z9981 Dependence on supplemental oxygen: Secondary | ICD-10-CM | POA: Diagnosis not present

## 2020-05-21 DIAGNOSIS — Z596 Low income: Secondary | ICD-10-CM | POA: Diagnosis not present

## 2020-05-21 DIAGNOSIS — I110 Hypertensive heart disease with heart failure: Principal | ICD-10-CM | POA: Diagnosis present

## 2020-05-21 DIAGNOSIS — Z955 Presence of coronary angioplasty implant and graft: Secondary | ICD-10-CM

## 2020-05-21 DIAGNOSIS — Z87891 Personal history of nicotine dependence: Secondary | ICD-10-CM

## 2020-05-21 DIAGNOSIS — Z951 Presence of aortocoronary bypass graft: Secondary | ICD-10-CM

## 2020-05-21 DIAGNOSIS — I480 Paroxysmal atrial fibrillation: Secondary | ICD-10-CM | POA: Diagnosis not present

## 2020-05-21 LAB — CBC WITH DIFFERENTIAL/PLATELET
Abs Immature Granulocytes: 0.01 10*3/uL (ref 0.00–0.07)
Basophils Absolute: 0.1 10*3/uL (ref 0.0–0.1)
Basophils Relative: 1 %
Eosinophils Absolute: 0.3 10*3/uL (ref 0.0–0.5)
Eosinophils Relative: 5 %
HCT: 50.1 % — ABNORMAL HIGH (ref 36.0–46.0)
Hemoglobin: 15.6 g/dL — ABNORMAL HIGH (ref 12.0–15.0)
Immature Granulocytes: 0 %
Lymphocytes Relative: 15 %
Lymphs Abs: 1.1 10*3/uL (ref 0.7–4.0)
MCH: 29.5 pg (ref 26.0–34.0)
MCHC: 31.1 g/dL (ref 30.0–36.0)
MCV: 94.9 fL (ref 80.0–100.0)
Monocytes Absolute: 0.6 10*3/uL (ref 0.1–1.0)
Monocytes Relative: 8 %
Neutro Abs: 5.2 10*3/uL (ref 1.7–7.7)
Neutrophils Relative %: 71 %
Platelets: 219 10*3/uL (ref 150–400)
RBC: 5.28 MIL/uL — ABNORMAL HIGH (ref 3.87–5.11)
RDW: 19.4 % — ABNORMAL HIGH (ref 11.5–15.5)
WBC: 7.3 10*3/uL (ref 4.0–10.5)
nRBC: 0 % (ref 0.0–0.2)

## 2020-05-21 LAB — CREATININE, SERUM
Creatinine, Ser: 1.05 mg/dL — ABNORMAL HIGH (ref 0.44–1.00)
GFR calc Af Amer: >60 mL/min (ref >60)
GFR calc non Af Amer: 58 mL/min — ABNORMAL LOW (ref >60)

## 2020-05-21 LAB — BASIC METABOLIC PANEL
Anion gap: 10 (ref 5–15)
BUN: 8 mg/dL (ref 6–20)
CO2: 25 mmol/L (ref 22–32)
Calcium: 9.3 mg/dL (ref 8.9–10.3)
Chloride: 105 mmol/L (ref 98–111)
Creatinine, Ser: 0.88 mg/dL (ref 0.44–1.00)
GFR calc Af Amer: >60 mL/min (ref >60)
GFR calc non Af Amer: >60 mL/min (ref >60)
Glucose, Bld: 123 mg/dL — ABNORMAL HIGH (ref 70–99)
Potassium: 3.9 mmol/L (ref 3.5–5.1)
Sodium: 140 mmol/L (ref 135–145)

## 2020-05-21 LAB — URINE CULTURE

## 2020-05-21 LAB — CBC
HCT: 52.9 % — ABNORMAL HIGH (ref 36.0–46.0)
Hemoglobin: 17.1 g/dL — ABNORMAL HIGH (ref 12.0–15.0)
MCH: 29.9 pg (ref 26.0–34.0)
MCHC: 32.3 g/dL (ref 30.0–36.0)
MCV: 92.6 fL (ref 80.0–100.0)
Platelets: 252 10*3/uL (ref 150–400)
RBC: 5.71 MIL/uL — ABNORMAL HIGH (ref 3.87–5.11)
RDW: 19.5 % — ABNORMAL HIGH (ref 11.5–15.5)
WBC: 8.1 10*3/uL (ref 4.0–10.5)
nRBC: 0 % (ref 0.0–0.2)

## 2020-05-21 LAB — TROPONIN I (HIGH SENSITIVITY)
Troponin I (High Sensitivity): 24 ng/L — ABNORMAL HIGH (ref <18)
Troponin I (High Sensitivity): 16 ng/L (ref <18)

## 2020-05-21 LAB — HEPATIC FUNCTION PANEL
ALT: 12 U/L (ref 0–44)
AST: 22 U/L (ref 15–41)
Albumin: 3.9 g/dL (ref 3.5–5.0)
Alkaline Phosphatase: 107 U/L (ref 38–126)
Bilirubin, Direct: 0.3 mg/dL — ABNORMAL HIGH (ref 0.0–0.2)
Indirect Bilirubin: 0.7 mg/dL (ref 0.3–0.9)
Total Bilirubin: 1 mg/dL (ref 0.3–1.2)
Total Protein: 7.1 g/dL (ref 6.5–8.1)

## 2020-05-21 LAB — SARS CORONAVIRUS 2 BY RT PCR (HOSPITAL ORDER, PERFORMED IN ~~LOC~~ HOSPITAL LAB): SARS Coronavirus 2: NEGATIVE

## 2020-05-21 LAB — HIV ANTIBODY (ROUTINE TESTING W REFLEX): HIV Screen 4th Generation wRfx: NONREACTIVE

## 2020-05-21 LAB — BRAIN NATRIURETIC PEPTIDE: B Natriuretic Peptide: 896.5 pg/mL — ABNORMAL HIGH (ref 0.0–100.0)

## 2020-05-21 MED ORDER — FUROSEMIDE 10 MG/ML IJ SOLN
80.0000 mg | Freq: Two times a day (BID) | INTRAMUSCULAR | Status: DC
  Administered 2020-05-21 – 2020-05-22 (×2): 80 mg via INTRAVENOUS
  Filled 2020-05-21 (×2): qty 8

## 2020-05-21 MED ORDER — METHYLPREDNISOLONE SODIUM SUCC 125 MG IJ SOLR
125.0000 mg | Freq: Once | INTRAMUSCULAR | Status: AC
  Administered 2020-05-21: 125 mg via INTRAVENOUS
  Filled 2020-05-21: qty 2

## 2020-05-21 MED ORDER — TICAGRELOR 90 MG PO TABS
90.0000 mg | ORAL_TABLET | Freq: Two times a day (BID) | ORAL | Status: DC
  Administered 2020-05-21 – 2020-05-24 (×6): 90 mg via ORAL
  Filled 2020-05-21 (×6): qty 1

## 2020-05-21 MED ORDER — SPIRONOLACTONE 25 MG PO TABS
25.0000 mg | ORAL_TABLET | Freq: Every day | ORAL | Status: DC
  Administered 2020-05-22 – 2020-05-24 (×3): 25 mg via ORAL
  Filled 2020-05-21 (×3): qty 1

## 2020-05-21 MED ORDER — IOHEXOL 350 MG/ML SOLN
75.0000 mL | Freq: Once | INTRAVENOUS | Status: AC | PRN
  Administered 2020-05-21: 75 mL via INTRAVENOUS

## 2020-05-21 MED ORDER — POTASSIUM CHLORIDE CRYS ER 20 MEQ PO TBCR
20.0000 meq | EXTENDED_RELEASE_TABLET | Freq: Every day | ORAL | Status: DC
  Administered 2020-05-21 – 2020-05-24 (×4): 20 meq via ORAL
  Filled 2020-05-21 (×4): qty 1

## 2020-05-21 MED ORDER — NITROGLYCERIN 0.4 MG SL SUBL: 0.4000 mg | SUBLINGUAL_TABLET | SUBLINGUAL | Status: DC | PRN

## 2020-05-21 MED ORDER — ATORVASTATIN CALCIUM 80 MG PO TABS
80.0000 mg | ORAL_TABLET | Freq: Every day | ORAL | Status: DC
  Administered 2020-05-21 – 2020-05-23 (×3): 80 mg via ORAL
  Filled 2020-05-21 (×3): qty 1

## 2020-05-21 MED ORDER — SODIUM CHLORIDE 0.9% FLUSH
3.0000 mL | Freq: Two times a day (BID) | INTRAVENOUS | Status: DC
  Administered 2020-05-21 – 2020-05-24 (×6): 3 mL via INTRAVENOUS

## 2020-05-21 MED ORDER — ALBUTEROL SULFATE HFA 108 (90 BASE) MCG/ACT IN AERS
4.0000 | INHALATION_SPRAY | Freq: Once | RESPIRATORY_TRACT | Status: AC
  Administered 2020-05-21: 4 via RESPIRATORY_TRACT
  Filled 2020-05-21: qty 6.7

## 2020-05-21 MED ORDER — HEPARIN SODIUM (PORCINE) 5000 UNIT/ML IJ SOLN
5000.0000 [IU] | Freq: Three times a day (TID) | INTRAMUSCULAR | Status: DC
  Administered 2020-05-21 – 2020-05-24 (×9): 5000 [IU] via SUBCUTANEOUS
  Filled 2020-05-21 (×9): qty 1

## 2020-05-21 MED ORDER — IPRATROPIUM-ALBUTEROL 0.5-2.5 (3) MG/3ML IN SOLN: 3.0000 mL | Freq: Two times a day (BID) | RESPIRATORY_TRACT | Status: DC | PRN

## 2020-05-21 MED ORDER — ACETAMINOPHEN 325 MG PO TABS: 650.0000 mg | ORAL_TABLET | ORAL | Status: DC | PRN

## 2020-05-21 MED ORDER — SODIUM CHLORIDE 0.9% FLUSH: 3.0000 mL | INTRAVENOUS | Status: DC | PRN

## 2020-05-21 MED ORDER — IVABRADINE HCL 7.5 MG PO TABS
7.5000 mg | ORAL_TABLET | Freq: Two times a day (BID) | ORAL | Status: DC
  Administered 2020-05-21 – 2020-05-24 (×6): 7.5 mg via ORAL
  Filled 2020-05-21 (×8): qty 1

## 2020-05-21 MED ORDER — SERTRALINE HCL 50 MG PO TABS
50.0000 mg | ORAL_TABLET | Freq: Every day | ORAL | Status: DC
  Administered 2020-05-21 – 2020-05-24 (×3): 50 mg via ORAL
  Filled 2020-05-21 (×4): qty 1

## 2020-05-21 MED ORDER — FUROSEMIDE 10 MG/ML IJ SOLN
60.0000 mg | Freq: Once | INTRAMUSCULAR | Status: AC
  Administered 2020-05-21: 60 mg via INTRAVENOUS
  Filled 2020-05-21: qty 6

## 2020-05-21 MED ORDER — SODIUM CHLORIDE 0.9 % IV SOLN: 250.0000 mL | INTRAVENOUS | Status: DC | PRN

## 2020-05-21 MED ORDER — ALPRAZOLAM 0.25 MG PO TABS
0.2500 mg | ORAL_TABLET | Freq: Every day | ORAL | Status: DC | PRN
  Administered 2020-05-22: 0.25 mg via ORAL
  Filled 2020-05-21: qty 1

## 2020-05-21 MED ORDER — ASPIRIN EC 81 MG PO TBEC
81.0000 mg | DELAYED_RELEASE_TABLET | Freq: Every day | ORAL | Status: DC
  Administered 2020-05-21 – 2020-05-24 (×4): 81 mg via ORAL
  Filled 2020-05-21 (×5): qty 1

## 2020-05-21 MED ORDER — ALBUTEROL SULFATE HFA 108 (90 BASE) MCG/ACT IN AERS
2.0000 | INHALATION_SPRAY | Freq: Four times a day (QID) | RESPIRATORY_TRACT | Status: DC | PRN
  Filled 2020-05-21: qty 6.7

## 2020-05-21 NOTE — ED Provider Notes (Signed)
[facility] EMERGENCY DEPARTMENT Provider Note   CSN: [id number] Arrival date & time: 05/21/20  1028     History Chief Complaint  Patient presents with  . Shortness of Breath    Brittany [name] is a 59 y.o. female.  The history is provided by the patient.  Shortness of Breath Severity:  Moderate Onset quality:  Gradual Timing:  Constant Progression:  Worsening Chronicity:  Recurrent Context comment:  Feels like she has fluid in lungs, saw HF doctor yesterday who taught she probably needed admission but wanted to try outpatient treatment. Cant walk more than a few steps without severe SOB. No chest pain. No fever. No vaccinated again covid. Relieved by:  Rest Worsened by:  Activity Associated symptoms: cough   Associated symptoms: no abdominal pain, no chest pain, no claudication, no ear pain, no fever, no rash, no sore throat, no sputum production and no vomiting   Risk factors comment:  CHF, CAD, asthma      Past Medical History:  Diagnosis Date  . Acute systolic congestive heart failure ([place]) 02/03/2018  . Allergy   . Anxiety   . Asthma   . Hypertension   . PAF (paroxysmal atrial fibrillation) ([place])   . Prophylactic measure 08/03/14-08/19/14   Prophyl. cranial radiation 24 Gy  . 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 Small cell lung cancer ([place]) 03/16/2014    Patient Active Problem List   Diagnosis Date Noted  . Ischemic cardiomyopathy 04/12/2020  . 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 NSTEMI (non-ST elevated myocardial infarction) ([place]) 02/12/2019  . CHF (congestive heart failure) ([place]) 11/24/2018  . Recurrent pleural effusion on right 05/05/2018  . Acute respiratory failure ([place]) 05/05/2018  . Dyspnea 04/19/2018  . Chronic respiratory failure with hypoxia ([place])   . Acute on chronic  systolic CHF (congestive heart failure) ([place]) 02/03/2018  . S/P CABG (coronary artery bypass graft) 02/03/2018  . Asthma   . Anxiety   . Allergy   . HCAP (healthcare-associated pneumonia) 03/15/2015  . Chest pain 03/15/2015  . Small cell lung cancer ([place]) 03/16/2014  . Protein-calorie malnutrition, severe ([place]) 03/14/2014  . Atrial fibrillation ([place]) 03/12/2014  . HTN (hypertension) 05/07/2012    Past Surgical History:  Procedure Laterality Date  . CARDIAC DEFIBRILLATOR PLACEMENT  08/15/2018   MDT Visia AF MRI VR ICD implanted by Dr Loel Lofty for primary prevention of sudden  . CESAREAN SECTION    . CORONARY ARTERY BYPASS GRAFT N/A 02/03/2018   Procedure: CORONARY ARTERY BYPASS GRAFTING (CABG);  Surgeon: Rexene Alberts, MD;  Location: [facility];  Service: Open Heart Surgery;  Laterality: N/A;  Time 3 using left internal mammary artery and endoscopically harvested right saphenous vein  . CORONARY BALLOON ANGIOPLASTY N/A 02/03/2018   Procedure: CORONARY BALLOON ANGIOPLASTY;  Surgeon: Martinique, Peter M, MD;  Location: [facility];  Service: Cardiovascular;  Laterality: N/A;  . CORONARY STENT INTERVENTION N/A 02/12/2019   Procedure: CORONARY STENT INTERVENTION;  Surgeon: Wellington Hampshire, MD;  Location: [facility];  Service: Cardiovascular;  Laterality: N/A;  . CORONARY/GRAFT ACUTE MI REVASCULARIZATION N/A 02/03/2018   Procedure: Coronary/Graft Acute MI Revascularization;  Surgeon: Martinique, Peter M, MD;  Location: [facility];  Service: Cardiovascular;  Laterality: N/A;  . IABP INSERTION N/A 02/03/2018   Procedure: IABP Insertion;  Surgeon:  Martinique, Peter M, MD;  Location: [facility];  Service: Cardiovascular;  Laterality: N/A;  . INTRAOPERATIVE TRANSESOPHAGEAL ECHOCARDIOGRAM N/A 02/03/2018   Procedure: INTRAOPERATIVE TRANSESOPHAGEAL ECHOCARDIOGRAM;  Surgeon: Rexene Alberts, MD;  Location: [facility];  Service: Open Heart Surgery;  Laterality: N/A;  . LEFT HEART CATH AND  CORONARY ANGIOGRAPHY N/A 02/03/2018   Procedure: LEFT HEART CATH AND CORONARY ANGIOGRAPHY;  Surgeon: Martinique, Peter M, MD;  Location: [facility];  Service: Cardiovascular;  Laterality: N/A;  . LEFT HEART CATH AND CORS/GRAFTS ANGIOGRAPHY N/A 02/12/2019   Procedure: LEFT HEART CATH AND CORS/GRAFTS ANGIOGRAPHY;  Surgeon: Wellington Hampshire, MD;  Location: [facility];  Service: Cardiovascular;  Laterality: N/A;  . MEDIASTINOSCOPY N/A 03/11/2014   Procedure: MEDIASTINOSCOPY;  Surgeon: Melrose Nakayama, MD;  Location: [facility];  Service: Thoracic;  Laterality: N/A;  . PLACEMENT OF [place] LEFT VENTRICULAR ASSIST DEVICE  02/03/2018   Procedure: PLACEMENT OF [place] LEFT VENTRICULAR ASSIST DEVICE LD;  Surgeon: Rexene Alberts, MD;  Location: [facility];  Service: Open Heart Surgery;;  . REMOVAL OF IMPELLA LEFT VENTRICULAR ASSIST DEVICE N/A 02/08/2018   Procedure: REMOVAL OF [place] LEFT VENTRICULAR ASSIST DEVICE;  Surgeon: Rexene Alberts, MD;  Location: [facility];  Service: Open Heart Surgery;  Laterality: N/A;  . RIGHT HEART CATH N/A 02/03/2018   Procedure: RIGHT HEART CATH;  Surgeon: Martinique, Peter M, MD;  Location: [facility];  Service: Cardiovascular;  Laterality: N/A;  . RIGHT HEART CATH N/A 05/09/2018   Procedure: RIGHT HEART CATH;  Surgeon: Jolaine Artist, MD;  Location: [facility];  Service: Cardiovascular;  Laterality: N/A;  . TEE WITHOUT CARDIOVERSION N/A 02/08/2018   Procedure: TRANSESOPHAGEAL ECHOCARDIOGRAM (TEE);  Surgeon: Rexene Alberts, MD;  Location: [facility];  Service: Open Heart Surgery;  Laterality: N/A;  . TUBAL LIGATION    . VIDEO BRONCHOSCOPY WITH ENDOBRONCHIAL ULTRASOUND N/A 03/11/2014   Procedure: VIDEO BRONCHOSCOPY WITH ENDOBRONCHIAL ULTRASOUND;  Surgeon: Melrose Nakayama, MD;  Location: [facility];  Service: Thoracic;  Laterality: N/A;     OB History   No obstetric history on file.     Family History  Problem Relation Age of Onset  . Heart disease Mother     . Hypertension Mother   . Heart attack Mother   . Hypertension Maternal Grandmother   . Cancer Maternal Grandmother   . Diabetes Paternal Grandmother   . Stroke Neg Hx     Social History   Tobacco Use  . Smoking status: Former Smoker    Packs/day: 1.00    Years: 20.00    Pack years: 20.00    Types: Cigarettes    Quit date: 03/13/2014    Years since quitting: 6.1  . Smokeless tobacco: Never Used  Vaping Use  . Vaping Use: Never used  Substance Use Topics  . Alcohol use: Yes    Alcohol/week: 8.0 standard drinks    Types: 8 Glasses of wine per week  . Drug use: Yes    Types: Marijuana    Comment: "medicinal"    Home Medications Prior to Admission medications   Medication Sig Start Date End Date Taking? Authorizing Provider  albuterol (VENTOLIN HFA) 108 (90 Base) MCG/ACT inhaler Inhale 2 puffs into the lungs every 6 (six) hours as needed for wheezing or shortness of breath. 04/16/19  Yes Bensimhon, Shaune Pascal, MD  ALPRAZolam Duanne Moron) 0.25 MG tablet Take 1 tablet (0.25 mg total) by mouth daily as needed for  anxiety. 02/24/19  Yes Shirley Friar, PA-C  aspirin 81 MG EC tablet Take 1 tablet (81 mg total) by mouth daily. 07/03/18  Yes Bensimhon, Shaune Pascal, MD  atorvastatin (LIPITOR) 80 MG tablet Take 1 tablet (80 mg total) by mouth daily at 6 PM. 07/03/18  Yes Bensimhon, Shaune Pascal, MD  ipratropium-albuterol (DUONEB) 0.5-2.5 (3) MG/3ML SOLN Take 3 mLs by nebulization 2 (two) times daily. Patient taking differently: Take 3 mLs by nebulization 2 (two) times daily as needed (shortness of breath).  07/21/19  Yes Bensimhon, Shaune Pascal, MD  ivabradine (CORLANOR) 7.5 MG TABS tablet Take 1 tablet (7.5 mg total) by mouth 2 (two) times daily with a meal. 02/19/20  Yes Larey Dresser, MD  nitroGLYCERIN (NITROSTAT) 0.4 MG SL tablet Place 1 tablet (0.4 mg total) under the tongue every 5 (five) minutes as needed for chest pain. 02/14/19  Yes Shirley Friar, PA-C  oxyCODONE (OXY IR/ROXICODONE) 5  MG immediate release tablet Take 5 mg by mouth every 6 (six) hours as needed for moderate pain.  10/04/18  Yes [provider]  potassium chloride SA (K-DUR,KLOR-CON) 20 MEQ tablet Take 1 tablet (20 mEq total) by mouth daily. 02/26/19  Yes Shirley Friar, PA-C  sertraline (ZOLOFT) 50 MG tablet Take 1 tablet (50 mg total) by mouth daily. 02/24/19  Yes Shirley Friar, PA-C  ticagrelor (BRILINTA) 90 MG TABS tablet Take 1 tablet (90 mg total) by mouth 2 (two) times daily. 05/17/20  Yes Rosita Fire, Brittainy M, PA-C  torsemide (DEMADEX) 20 MG tablet Take 3 tablets (60 mg total) by mouth daily. Take additional as directed by HF team. Take 70mg  (3.5 tabs) on Monday and Friday only Patient taking differently: Take 60 mg by mouth daily.  06/12/19  Yes Bensimhon, Shaune Pascal, MD  spironolactone (ALDACTONE) 25 MG tablet Take 1 tablet (25 mg total) by mouth daily. 04/12/20   Thompson Grayer, MD    Allergies    Codeine  Review of Systems   Review of Systems  Constitutional: Negative for chills and fever.  HENT: Negative for ear pain and sore throat.   Eyes: Negative for pain and visual disturbance.  Respiratory: Positive for cough and shortness of breath. Negative for sputum production.   Cardiovascular: Negative for chest pain, palpitations and claudication.  Gastrointestinal: Negative for abdominal pain and vomiting.  Genitourinary: Negative for dysuria and hematuria.  Musculoskeletal: Negative for arthralgias and back pain.  Skin: Negative for color change and rash.  Neurological: Negative for seizures and syncope.  All other systems reviewed and are negative.   Physical Exam Updated Vital Signs  ED Triage Vitals  Enc Vitals Group     BP 05/21/20 1035 113/74     Pulse Rate 05/21/20 1035 82     Resp 05/21/20 1035 16     Temp 05/21/20 1035 98.2 F (36.8 C)     Temp Source 05/21/20 1035 Oral     SpO2 05/21/20 1035 100 %     Weight 05/21/20 1037 109 lb (49.4 kg)     Height  05/21/20 1037 4\' 11"  (1.499 m)     Head Circumference --      Peak Flow --      Pain Score 05/21/20 1034 0     Pain Loc --      Pain Edu? --      Excl. in [place]? --     Physical Exam Vitals and nursing note reviewed.  Constitutional:      General:  She is not in acute distress.    Appearance: She is well-developed. She is not ill-appearing.  HENT:     Head: Normocephalic and atraumatic.  Eyes:     Conjunctiva/sclera: Conjunctivae normal.     Pupils: Pupils are equal, round, and reactive to light.  Cardiovascular:     Rate and Rhythm: Normal rate and regular rhythm.     Heart sounds: No murmur heard.   Pulmonary:     Effort: Tachypnea present. No respiratory distress.     Breath sounds: Decreased breath sounds and rhonchi present.  Abdominal:     Palpations: Abdomen is soft.     Tenderness: There is no abdominal tenderness.  Musculoskeletal:     Cervical back: Normal range of motion and neck supple.     Right lower leg: No edema.     Left lower leg: No edema.  Skin:    General: Skin is warm and dry.     Capillary Refill: Capillary refill takes less than 2 seconds.  Neurological:     General: No focal deficit present.     Mental Status: She is alert.     ED Results / Procedures / Treatments   Labs (all labs ordered are listed, but only abnormal results are displayed) Labs Reviewed  CBC WITH DIFFERENTIAL/PLATELET - Abnormal; Notable for the following components:      Result Value   RBC 5.28 (*)    Hemoglobin 15.6 (*)    HCT 50.1 (*)    RDW 19.4 (*)    All other components within normal limits  BASIC METABOLIC PANEL - Abnormal; Notable for the following components:   Glucose, Bld 123 (*)    All other components within normal limits  HEPATIC FUNCTION PANEL - Abnormal; Notable for the following components:   Bilirubin, Direct 0.3 (*)    All other components within normal limits  BRAIN NATRIURETIC PEPTIDE - Abnormal; Notable for the following components:   B  Natriuretic Peptide 896.5 (*)    All other components within normal limits  TROPONIN I (HIGH SENSITIVITY) - Abnormal; Notable for the following components:   Troponin I (High Sensitivity) 24 (*)    All other components within normal limits  SARS CORONAVIRUS 2 BY RT PCR (HOSPITAL ORDER, [facility])  TROPONIN I (HIGH SENSITIVITY)    EKG EKG Interpretation  Date/Time:  Friday May 21 2020 10:35:00 EDT Ventricular Rate:  83 PR Interval:    QRS Duration: 98 QT Interval:  446 QTC Calculation: 525 R Axis:   -48 Text Interpretation: Sinus rhythm Biatrial enlargement Left anterior fascicular block LVH with secondary repolarization abnormality Prolonged QT interval Baseline wander in lead(s) V6 Confirmed by Lennice Sites [phone]) on 05/21/2020 10:36:22 AM   Radiology DG Chest Portable 1 View  Result Date: 05/21/2020 CLINICAL DATA:  Shortness of breath since yesterday. EXAM: PORTABLE CHEST 1 VIEW COMPARISON:  10/02/2019 FINDINGS: Stable enlarged cardiac silhouette, post CABG changes and left subclavian AICD lead. The lungs remain clear and hyperexpanded. Stable small amount of pleural thickening or fluid at the right lung base. Unremarkable bones. IMPRESSION: No acute abnormality. Stable cardiomegaly and changes of COPD. Electronically Signed   By: Claudie Revering M.D.   On: 05/21/2020 11:46    Procedures Procedures (including critical care time)  Medications Ordered in ED Medications  albuterol (VENTOLIN HFA) 108 (90 Base) MCG/ACT inhaler 4 puff (4 puffs Inhalation Given 05/21/20 1203)  methylPREDNISolone sodium succinate (SOLU-MEDROL) 125 mg/2 mL injection 125 mg (125  mg Intravenous Given 05/21/20 1357)  furosemide (LASIX) injection 60 mg (60 mg Intravenous Given 05/21/20 1357)    ED Course  I have reviewed the triage vital signs and the nursing notes.  Pertinent labs & imaging results that were available during my care of the patient were reviewed by me and  considered in my medical decision making (see chart for details).    MDM Rules/Calculators/A&P                          Brittany [name] is a 59 year old female with history of heart failure, CAD, asthma who presents the ED with shortness of breath.  Normal vitals.  No fever.  Was put on BiPAP by EMS but switched to nonrebreather here.  On room air she has normal oxygenation.  She has tachypnea.  She has rhonchorous breath sounds throughout.  No edema in her legs.  Saw her heart failure doctor yesterday who thought she needed probably admission.  She does not know she is above her dry weight.  She denies any chest pain, fever.  She has had a cough.  Is not vaccinated against coronavirus.  Patient feels that this is more likely heart failure than asthma.  Does not seem consistent with prior heart issues.  Not having specific chest pain.  Will place her back on oxygen for comfort.  Do not believe she needs BiPAP at this time and given unknown Covid status will hold at this time.  Seems less likely PE given heart failure and possible reactive airway process more likely.  Patient with mild elevation of troponin but stable x2.  BNP is around 850.  Chest x-ray however fairly clear.  No obvious pneumonia or volume overload.  No significant anemia, electrolyte abnormality, kidney injury.  Given her shortness of breath and overall unremarkable chest x-ray will get a CT scan to further rule out PE other process.  She did get some relief with albuterol inhaler.  Could be multifactorial shortness of breath with possibly asthma exacerbation and some increased fluid.  Will give IV Lasix.  Awaiting CT scan.  Still feeling short of breath but she is on room air but on oxygen for comfort.  Signed out to oncoming ED staff with patient pending CT scan.  Anticipate admission for multifactorial shortness of breath would benefit from further breathing treatments and diuresis.  She was given IV steroids as well.  This  chart was dictated using voice recognition software.  Despite best efforts to proofread,  errors can occur which can change the documentation meaning.    Final Clinical Impression(s) / ED Diagnoses Final diagnoses:  Shortness of breath    Rx / DC Orders ED Discharge Orders    None       Lennice Sites, DO 05/21/20 1452

## 2020-05-21 NOTE — ED Triage Notes (Signed)
Pt here from home via EMS for eval of shob since yesterday. Seen at MD office yesterday, told she had fluid on her lungs but they wanted to try Lasix first. Pt however did not take Lasix this morning. CPAP in route with improvement, arrives on NRB.

## 2020-05-21 NOTE — H&P (Addendum)
Advanced Heart Failure Team H&P Note   Primary Physician: Maude Leriche, PA-C PCP-Cardiologist:  Glori Bickers, MD  Reason for Admission: Acute on Chronic Systolic Heart Failure   HPI:    Brittany [name] is seen today for evaluation of acute on chronic systolic heart failure at the request of Dr. Ronnald Nian, Emergency Medicine.   She is a 59 y.o.femalewith a history of PAF,preveious smoker quit 5 years ago, hypertension, previous small cell lung cancer treated with chemo, chest XRT and prophylactic brain radiation in 2015, CAD s/p CABG and chronic systolic HF EF ~32%.   Admitted 02/03/2018 with NSTEMI and shock. Underwent emergent cath 02/03/18 showed LAD 100% stenosed, LCx 95% stenosed. Taken for emergent CABG 02/03/18. Required impella post op. Hospital course complicated by cardiogenic shock, HCAP, A fib, respiratory failure, and swallowing issues. She was discharged to SNF. Discharge weight 103 pounds.   In 2019 had multiple hospitalizations for HF and pleural effusion. Underwent pleurodesis at [facility].  Admitted 3/4 - 02/14/2019 with NSTEMIand HF. Received DES to ostial ramus into distal left mainand underwent diuresis. Meds adjusted as tolerated. Echo with EF 25-30%.  She was scheduled for AHF Clinic visit on 6/7, but due to ear ache and generalized malaise, she changed visit to virtual visit. At the time, she denied any significant respiratory symptoms and reported stable daily wts and compliance w/ cardiac meds. Here main complaints included more frequent bruising and painless hematuria as well as discoloration of feet and symptoms concerning for PAD/ poor circulation. She was instructed to report to clinic for labs and RN visit for EKG and BP check.   At subsequent RN visit on 6/10, she was notably SOB but no hypoxia. She reported acute onset of dyspnea the night prior and tried doubling her diuretics w/o improvement. O2 sats 98% on RA. RN walked pt around clinic w/  pulse oximetry. O2 sats remained stable at 96% on RA w/ ambulation. Wheezing noted on lung exam. No edema. ReDs clip elevated 53%. EKG showed NSR w/ RAD and pulmonary disease pattern. It was advised she go to the ED but she refused. Instead, she was set up for remote health for IV Lasix.   She now presents to the ED for worsening dyspnea. COVID negative. CXR shows stable cardiomegaly and changes of COPD. No edema or infiltrates. BNP 896. 1st Hs troponin 24. Repeat pending. WBC 7.3. Hgb 15.6. BMP unremarkable. CT of chest ordered.   She was given albuterol and dose of solu-medrol in ED + 60 mg IV Lasix. She feels slightly better but still w/ rhonchi/ wheezing. O2 sats 97% on RA.     Echo 02/2019  1. The left ventricle has severely reduced systolic function, with an  ejection fraction of 25-30%. The cavity size was mildly dilated. Left  ventricular diastolic Doppler parameters are consistent with impaired  relaxation Indeterminent filling pressures  The E/e' is 8-15.  2. Severe akinesis of the left ventricular anterior wall, anteroseptal  wall, apical segment and inferoapical wall.  3. The right ventricle has mildly reduced systolic function. The cavity  was moderately enlarged. There is no increase in right ventricular wall  thickness.  4. Left atrial size was moderately dilated.  5. The mitral valve is degenerative. Mild thickening of the mitral valve  leaflet. Mild calcification of the mitral valve leaflet.  6. The tricuspid valve is normal in structure.  7. The aortic valve was not well visualized Mild sclerosis of the aortic  valve. Aortic valve regurgitation is  mild by color flow Doppler.  8. When compared to the prior study: 04/20/2018: LVEF 20%.   Review of Systems: [y] = yes, [ ]  = no   . General: Weight gain [ ] ; Weight loss [ ] ; Anorexia [ ] ; Fatigue [ Y]; Fever [ ] ; Chills [ ] ; Weakness [Y]  . Cardiac: Chest pain/pressure [ ] ; Resting SOB [ ] ; Exertional SOB [Y ];  Orthopnea [Y ]; Pedal Edema [ ] ; Palpitations [ ] ; Syncope [ ] ; Presyncope [ ] ; Paroxysmal nocturnal dyspnea[ ]   . Pulmonary: Cough [ Y]; Wheezing[Y ]; Hemoptysis[ ] ; Sputum [ ] ; Snoring [ ]   . GI: Vomiting[ ] ; Dysphagia[ ] ; Melena[ ] ; Hematochezia [ ] ; Heartburn[ ] ; Abdominal pain [ ] ; Constipation [ ] ; Diarrhea [ ] ; BRBPR [ ]   . GU: Hematuria[ ] ; Dysuria [ ] ; Nocturia[ ]   . Vascular: Pain in legs with walking [ ] ; Pain in feet with lying flat [ ] ; Non-healing sores [ ] ; Stroke [ ] ; TIA [ ] ; Slurred speech [ ] ;  . Neuro: Headaches[ ] ; Vertigo[ ] ; Seizures[ ] ; Paresthesias[ ] ;Blurred vision [ ] ; Diplopia [ ] ; Vision changes [ ]   . Ortho/Skin: Arthritis [ ] ; Joint pain [ ] ; Muscle pain [ ] ; Joint swelling [ ] ; Back Pain [ ] ; Rash [ ]   . Psych: Depression[ ] ; Anxiety[ ]   . Heme: Bleeding problems [ ] ; Clotting disorders [ ] ; Anemia [ ]   . Endocrine: Diabetes [ ] ; Thyroid dysfunction[ ]   Home Medications Prior to Admission medications   Medication Sig Start Date End Date Taking? Authorizing Provider  albuterol (VENTOLIN HFA) 108 (90 Base) MCG/ACT inhaler Inhale 2 puffs into the lungs every 6 (six) hours as needed for wheezing or shortness of breath. 04/16/19  Yes Graycie Halley, Shaune Pascal, MD  ALPRAZolam Duanne Moron) 0.25 MG tablet Take 1 tablet (0.25 mg total) by mouth daily as needed for anxiety. 02/24/19  Yes Shirley Friar, PA-C  aspirin 81 MG EC tablet Take 1 tablet (81 mg total) by mouth daily. 07/03/18  Yes Glora Hulgan, Shaune Pascal, MD  atorvastatin (LIPITOR) 80 MG tablet Take 1 tablet (80 mg total) by mouth daily at 6 PM. 07/03/18  Yes Tonni Mansour, Shaune Pascal, MD  ipratropium-albuterol (DUONEB) 0.5-2.5 (3) MG/3ML SOLN Take 3 mLs by nebulization 2 (two) times daily. Patient taking differently: Take 3 mLs by nebulization 2 (two) times daily as needed (shortness of breath).  07/21/19  Yes Kamariah Fruchter, Shaune Pascal, MD  ivabradine (CORLANOR) 7.5 MG TABS tablet Take 1 tablet (7.5 mg total) by mouth 2 (two) times daily  with a meal. 02/19/20  Yes Larey Dresser, MD  nitroGLYCERIN (NITROSTAT) 0.4 MG SL tablet Place 1 tablet (0.4 mg total) under the tongue every 5 (five) minutes as needed for chest pain. 02/14/19  Yes Shirley Friar, PA-C  oxyCODONE (OXY IR/ROXICODONE) 5 MG immediate release tablet Take 5 mg by mouth every 6 (six) hours as needed for moderate pain.  10/04/18  Yes [provider]  potassium chloride SA (K-DUR,KLOR-CON) 20 MEQ tablet Take 1 tablet (20 mEq total) by mouth daily. 02/26/19  Yes Shirley Friar, PA-C  sertraline (ZOLOFT) 50 MG tablet Take 1 tablet (50 mg total) by mouth daily. 02/24/19  Yes Shirley Friar, PA-C  ticagrelor (BRILINTA) 90 MG TABS tablet Take 1 tablet (90 mg total) by mouth 2 (two) times daily. 05/17/20  Yes Rosita Fire, Brittainy M, PA-C  torsemide (DEMADEX) 20 MG tablet Take 3 tablets (60 mg total) by mouth daily. Take additional as directed  by HF team. Take 70mg  (3.5 tabs) on Monday and Friday only Patient taking differently: Take 60 mg by mouth daily.  06/12/19  Yes Whittney Steenson, Shaune Pascal, MD  spironolactone (ALDACTONE) 25 MG tablet Take 1 tablet (25 mg total) by mouth daily. 04/12/20   Thompson Grayer, MD    Past Medical History: Past Medical History:  Diagnosis Date  . Acute systolic congestive heart failure ([place]) 02/03/2018  . Allergy   . Anxiety   . Asthma   . Hypertension   . PAF (paroxysmal atrial fibrillation) ([place])   . Prophylactic measure 08/03/14-08/19/14   Prophyl. cranial radiation 24 Gy  . 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 Small cell lung cancer ([place]) 03/16/2014    Past Surgical History: Past Surgical History:  Procedure Laterality Date  . CARDIAC DEFIBRILLATOR PLACEMENT  08/15/2018   MDT Visia AF MRI VR ICD implanted by Dr Loel Lofty for primary prevention of sudden  . CESAREAN SECTION    . CORONARY ARTERY  BYPASS GRAFT N/A 02/03/2018   Procedure: CORONARY ARTERY BYPASS GRAFTING (CABG);  Surgeon: Rexene Alberts, MD;  Location: [place];  Service: Open Heart Surgery;  Laterality: N/A;  Time 3 using left internal mammary artery and endoscopically harvested right saphenous vein  . CORONARY BALLOON ANGIOPLASTY N/A 02/03/2018   Procedure: CORONARY BALLOON ANGIOPLASTY;  Surgeon: Martinique, Peter M, MD;  Location: [place] [facility];  Service: Cardiovascular;  Laterality: N/A;  . CORONARY STENT INTERVENTION N/A 02/12/2019   Procedure: CORONARY STENT INTERVENTION;  Surgeon: Wellington Hampshire, MD;  Location: [place] [facility];  Service: Cardiovascular;  Laterality: N/A;  . CORONARY/GRAFT ACUTE MI REVASCULARIZATION N/A 02/03/2018   Procedure: Coronary/Graft Acute MI Revascularization;  Surgeon: Martinique, Peter M, MD;  Location: [place] [facility];  Service: Cardiovascular;  Laterality: N/A;  . IABP INSERTION N/A 02/03/2018   Procedure: IABP Insertion;  Surgeon: Martinique, Peter M, MD;  Location: [place] [facility];  Service: Cardiovascular;  Laterality: N/A;  . INTRAOPERATIVE TRANSESOPHAGEAL ECHOCARDIOGRAM N/A 02/03/2018   Procedure: INTRAOPERATIVE TRANSESOPHAGEAL ECHOCARDIOGRAM;  Surgeon: Rexene Alberts, MD;  Location: [place];  Service: Open Heart Surgery;  Laterality: N/A;  . LEFT HEART CATH AND CORONARY ANGIOGRAPHY N/A 02/03/2018   Procedure: LEFT HEART CATH AND CORONARY ANGIOGRAPHY;  Surgeon: Martinique, Peter M, MD;  Location: [place] [facility];  Service: Cardiovascular;  Laterality: N/A;  . LEFT HEART CATH AND CORS/GRAFTS ANGIOGRAPHY N/A 02/12/2019   Procedure: LEFT HEART CATH AND CORS/GRAFTS ANGIOGRAPHY;  Surgeon: Wellington Hampshire, MD;  Location: [place] [facility];  Service: Cardiovascular;  Laterality: N/A;  . MEDIASTINOSCOPY N/A 03/11/2014   Procedure: MEDIASTINOSCOPY;  Surgeon: Melrose Nakayama, MD;  Location: [place];  Service: Thoracic;  Laterality: N/A;  . PLACEMENT OF [place] LEFT VENTRICULAR ASSIST DEVICE   02/03/2018   Procedure: PLACEMENT OF [place] LEFT VENTRICULAR ASSIST DEVICE LD;  Surgeon: Rexene Alberts, MD;  Location: [place];  Service: Open Heart Surgery;;  . REMOVAL OF IMPELLA LEFT VENTRICULAR ASSIST DEVICE N/A 02/08/2018   Procedure: REMOVAL OF [place] LEFT VENTRICULAR ASSIST DEVICE;  Surgeon: Rexene Alberts, MD;  Location: [place];  Service: Open Heart Surgery;  Laterality: N/A;  . RIGHT HEART CATH N/A 02/03/2018   Procedure: RIGHT HEART CATH;  Surgeon: Martinique, Peter M, MD;  Location: [place] [facility];  Service: Cardiovascular;  Laterality: N/A;  . RIGHT HEART CATH N/A 05/09/2018   Procedure: RIGHT HEART CATH;  Surgeon: Jolaine Artist, MD;  Location: [place] [facility];  Service: Cardiovascular;  Laterality: N/A;  . TEE WITHOUT CARDIOVERSION N/A 02/08/2018   Procedure: TRANSESOPHAGEAL ECHOCARDIOGRAM (TEE);  Surgeon: Rexene Alberts, MD;  Location: [place];  Service: Open Heart Surgery;  Laterality: N/A;  . TUBAL LIGATION    . VIDEO BRONCHOSCOPY WITH ENDOBRONCHIAL ULTRASOUND N/A 03/11/2014   Procedure: VIDEO BRONCHOSCOPY WITH ENDOBRONCHIAL ULTRASOUND;  Surgeon: Melrose Nakayama, MD;  Location: [facility];  Service: Thoracic;  Laterality: N/A;    Family History: Family History  Problem Relation Age of Onset  . Heart disease Mother   . Hypertension Mother   . Heart attack Mother   . Hypertension Maternal Grandmother   . Cancer Maternal Grandmother   . Diabetes Paternal Grandmother   . Stroke Neg Hx     Social History: Social History   Socioeconomic History  . Marital status: Married    Spouse name: Not on file  . Number of children: Not on file  . Years of education: Not on file  . Highest education level: Not on file  Occupational History  . Not on file  Tobacco Use  . Smoking status: Former Smoker    Packs/day: 1.00    Years: 20.00    Pack years: 20.00    Types: Cigarettes    Quit date: 03/13/2014    Years since quitting: 6.1  . Smokeless tobacco: Never Used  Vaping  Use  . Vaping Use: Never used  Substance and Sexual Activity  . Alcohol use: Yes    Alcohol/week: 8.0 standard drinks    Types: 8 Glasses of wine per week  . Drug use: Yes    Types: Marijuana    Comment: "medicinal"  . Sexual activity: Never  Other Topics Concern  . Not on file  Social History Narrative  . Not on file   Social Determinants of Health   Financial Resource Strain:   . Difficulty of Paying Living Expenses:   Food Insecurity:   . Worried About Charity fundraiser in the Last Year:   . Arboriculturist in the Last Year:   Transportation Needs:   . Film/video editor (Medical):   Marland Kitchen Lack of Transportation (Non-Medical):   Physical Activity:   . Days of Exercise per Week:   . Minutes of Exercise per Session:   Stress:   . Feeling of Stress :   Social Connections:   . Frequency of Communication with Friends and Family:   . Frequency of Social Gatherings with Friends and Family:   . Attends Religious Services:   . Active Member of Clubs or Organizations:   . Attends Archivist Meetings:   Marland Kitchen Marital Status:     Allergies:  Allergies  Allergen Reactions  . Codeine Nausea And Vomiting    Objective:    Vital Signs:   Temp:  [98.2 F (36.8 C)] 98.2 F (36.8 C) (06/11 1035) Pulse Rate:  [80-91] 84 (06/11 1358) Resp:  [15-26] 15 (06/11 1358) BP: (103-120)/(69-93) 113/81 (06/11 1358) SpO2:  [96 %-100 %] 99 % (06/11 1358) Weight:  [49.4 kg] 49.4 kg (06/11 1037)    Weight change: Filed Weights   05/21/20 1037  Weight: 49.4 kg    Intake/Output:  No intake or output data in the 24 hours ending 05/21/20 1659    Physical Exam    General:  Thin middle aged female. rhochorus BS HEENT: normal Neck: supple. JVP elevated to ear . Carotids 2+ bilat; no bruits. No lymphadenopathy or thyromegaly appreciated. Cor: PMI nondisplaced. Regular rate & rhythm. No rubs, gallops or murmurs. Lungs: course BS bilateral R>L.  Diffuse expiratory rhonchi    Abdomen: soft, nontender, nondistended. No hepatosplenomegaly. No bruits or masses. Good bowel sounds. Extremities: no cyanosis, clubbing, rash, edema Neuro: alert & orientedx3, cranial nerves grossly intact. moves all 4 extremities w/o difficulty. Affect pleasant   Telemetry   NSR 90s   EKG    NSR w/ biatrial enlargement and Left anterior fascicular block  Labs   Basic Metabolic Panel: Recent Labs  Lab 05/20/20 1423 05/21/20 1102  NA 141 140  K 3.2* 3.9  CL 105 105  CO2 22 25  GLUCOSE 117* 123*  BUN 11 8  CREATININE 1.11* 0.88  CALCIUM 9.8 9.3    Liver Function Tests: Recent Labs  Lab 05/20/20 1423 05/21/20 1102  AST 21 22  ALT 14 12  ALKPHOS 107 107  BILITOT 1.2 1.0  PROT 7.4 7.1  ALBUMIN 4.1 3.9   No results for input(s): LIPASE, AMYLASE in the last 168 hours. No results for input(s): AMMONIA in the last 168 hours.  CBC: Recent Labs  Lab 05/20/20 1423 05/21/20 1102  WBC 8.0 7.3  NEUTROABS  --  5.2  HGB 16.5* 15.6*  HCT 52.3* 50.1*  MCV 94.6 94.9  PLT 246 219    Cardiac Enzymes: No results for input(s): CKTOTAL, CKMB, CKMBINDEX, TROPONINI in the last 168 hours.  BNP: BNP (last 3 results) Recent Labs    06/12/19 1520 10/02/19 1707 05/21/20 1102  BNP 1,728.7* 1,083.5* 896.5*    ProBNP (last 3 results) No results for input(s): PROBNP in the last 8760 hours.   CBG: No results for input(s): GLUCAP in the last 168 hours.  Coagulation Studies: No results for input(s): LABPROT, INR in the last 72 hours.   Imaging   CT Angio Chest PE W and/or Wo Contrast  Result Date: 05/21/2020 CLINICAL DATA:  Shortness of breath, onset yesterday. Radiologic records demonstrates history of small cell lung cancer. EXAM: CT ANGIOGRAPHY CHEST WITH CONTRAST TECHNIQUE: Multidetector CT imaging of the chest was performed using the standard protocol during bolus administration of intravenous contrast. Multiplanar CT image reconstructions and MIPs were  obtained to evaluate the vascular anatomy. CONTRAST:  67mL OMNIPAQUE IOHEXOL 350 MG/ML SOLN COMPARISON:  Radiograph earlier today.  Chest CT 01/27/2020 FINDINGS: Cardiovascular: There are no filling defects within the pulmonary arteries to suggest pulmonary embolus. Right upper lobe pulmonary arteries are attenuated but no discrete filling defect. Left-sided pacemaker remains in place. Post CABG. Soft tissue density adjacent to the ascending aorta previously filled with IV contrast and is consistent with chronic outpouching, unchanged from previous. Coronary artery calcifications and stents. Multi chamber cardiomegaly. No significant pericardial effusion. Mediastinum/Nodes: Loss of fat plane with ill-defined soft tissue involving the distal trachea, carina, and right greater than left mainstem bronchus. This causes circumferential narrowing of the right lobar bronchi. Overall appearance is similar to February 2021 exam. No discrete measurable lymph nodes. Esophageal wall thickening which was also seen on prior. Previous subcentimeter right thyroid nodule is not as well-defined on the current exam given phase of contrast. This was too small for further evaluation on prior. Lungs/Pleura: Right central perihilar soft tissue thickening appears unchanged from prior exam. Volume loss particularly in the right middle lobe and anterior right lower lobe with air bronchograms, unchanged. There  is central bronchial thickening. Narrowing of the right mainstem bronchus again seen. Stable tiny subpleural right upper lobe nodule, series 8, image 42. Slight heterogeneous pulmonary parenchyma. Subsegmental atelectasis in the lingula and both lower lobes. No confluent consolidation. No septal thickening or pulmonary edema. Trace right pleural thickening without significant effusion. Upper Abdomen: No acute findings. Musculoskeletal: Tiny sclerotic density within T7 vertebral body, unchanged. Post median sternotomy. There are no acute  or suspicious osseous abnormalities. Review of the MIP images confirms the above findings. IMPRESSION: 1. No pulmonary embolus. 2. Stable appearance of right perihilar consolidation, likely treatment related change in patient with history of small cell lung cancer. Overall appearance is similar to February 2021 exam. 3. Central bronchial thickening. Heterogeneous pulmonary parenchyma may be seen with small vessel or small airways disease. 4. Additional chronic findings are stable. Aortic Atherosclerosis (ICD10-I70.0). Electronically Signed   By: Keith Rake M.D.   On: 05/21/2020 16:56   DG Chest Portable 1 View  Result Date: 05/21/2020 CLINICAL DATA:  Shortness of breath since yesterday. EXAM: PORTABLE CHEST 1 VIEW COMPARISON:  10/02/2019 FINDINGS: Stable enlarged cardiac silhouette, post CABG changes and left subclavian AICD lead. The lungs remain clear and hyperexpanded. Stable small amount of pleural thickening or fluid at the right lung base. Unremarkable bones. IMPRESSION: No acute abnormality. Stable cardiomegaly and changes of COPD. Electronically Signed   By: Claudie Revering M.D.   On: 05/21/2020 11:46     Medications:     Current Medications:  Infusions:     Assessment/Plan   1. Acute on Chronic Systolic Heart Failure  - Echo 09/04/18 ([facility]) LVEF 20%, Moderate AI, Mild MR, Mild TR, Severe LAE, RV mildly decreased.  - Echo 02/12/19: EF 25-30%, RV mildly reduced - BNP 895. ReDs clip in [facility] yesterday was elevated at 53%.  - volume overload on exam, JVD to ear. Admit for IV Lasix. Plan Lasix 80 mg bid.  - strict I/Os, daily wts, daily BMPs   2. Acute COPD exacerabation - suspect she has COPD. Former smoker. Known asthmatic  - lung exam and CXR c/w COPD.  - needs PFTs, can be done as outpatient  - chest CT has been ordered   3. 3. CAD - Hx of NSTEMI/STEMI s/p Emergent CABG 02/03/18: Cath with severe 2v CAD as above with severe LV dysfunction/ICM. s/p Emergent CABG 02/03/18. -  Had NSTEMI and now s/p LHC 02/12/19 with DES to the ostial ramus extending into the distal left main - She denies CP but w/ increased dyspnea, suspect most likely pulmonary etiology + a/c CHF.  - Hs troponin c/w ACS, 24>>16   - continue medical therapy  - She has completed a year of DAPT w/ ASA and Brilinta. Consider reducing Brilinta to 60 mg bid or switch to Plavix.   4. H/o Recurrent pleural effusionss/p pleurodesis  - CXR today shows now recurrent pleural effusions  - Chest CT pending   5. PAF - CHA2DS2/VASc is at least 4. (CHF, Vasc disease, HTN, Female).  - She has history of Afib RVR in the past in the setting of her Lung CA. Previously on Xarelto but stopped.  - Reviewed her most recent device interrogation from 04/05/20. No afib detection byICD. NSR on EKG today  - Continue corlanor 7.5 mg bid   6. 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  - Chest CT 02/19/18 with mass-like consolidation in R hilum concerning for recurrent tumor.  - Repeat CT 03/27/2018 -No definite findings of  locally recurrent tumor in the right lung. 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 Continued chest CT surveillance is advised in 3-6 months. - S/p thoracentesis 04/2018. Cytology negative for malignancy.  - CT chest 09/23/18 with slight increase in size of loculated right hydropneumothorax, status post thoracostomy tube removal. No evidence of tumor noted. - repeat chest CT ordered.    Length of Stay: 0  Lyda Jester, PA-C  05/21/2020, 4:59 PM  Advanced Heart Failure Team Pager [phone] (M-F; [place])  Please contact [facility] for night-coverage after hours (4p -7a ) and weekends on [email or web address]  Patient seen and examined with the above-signed Advanced Practice Provider and/or Housestaff. I personally reviewed laboratory data, imaging studies and relevant notes. I independently examined the patient  and formulated the important aspects of the plan. I have edited the note to reflect any of my changes or salient points. I have personally discussed the plan with the patient and/or family.  59 y/o woman with CAD, COPD, SCLC, systolic HF due to iCM presents with worsening dyspnea. BNP elevated but not as high as previous. CXR and CT scan relatively clear.   Has responded well to steroids and IV lasix in ER. Hs trop ok.   General:  Weak appearing. No resp difficulty HEENT: normal Neck: supple. JVP 9-10 Carotids 2+ bilat; no bruits. No lymphadenopathy or thryomegaly appreciated. Cor: PMI nondisplaced. Regular rate & rhythm. No rubs, gallops or murmurs. Lungs: mild crackles. No wheeze Abdomen: soft, nontender, nondistended. No hepatosplenomegaly. No bruits or masses. Good bowel sounds. Extremities: no cyanosis, clubbing, rash, edema Neuro: alert & orientedx3, cranial nerves grossly intact. moves all 4 extremities w/o difficulty. Affect pleasant  Suspect possible combination of a/c HF and COPD exacerbation. She has received steroids and IV lasix. Seems to be moving air fairly well now. No fever of leukocytosis. Will hold off on further steroids for now. Admit for IV diuresis. Watch renal function. No evidence of ACS.  Glori Bickers, MD  5:59 PM

## 2020-05-21 NOTE — ED Notes (Signed)
Dinner Tray Ordered @ [phone].

## 2020-05-21 NOTE — Consult Note (Deleted)
See H&P Note

## 2020-05-21 NOTE — ED Notes (Addendum)
Pt sats 99% on RA, EDP suggests [place] for comfort. Pt requests NRB repeatedly. NRB placed at 8L per pt request.

## 2020-05-22 DIAGNOSIS — J441 Chronic obstructive pulmonary disease with (acute) exacerbation: Secondary | ICD-10-CM

## 2020-05-22 LAB — BASIC METABOLIC PANEL
Anion gap: 12 (ref 5–15)
BUN: 17 mg/dL (ref 6–20)
CO2: 21 mmol/L — ABNORMAL LOW (ref 22–32)
Calcium: 9.5 mg/dL (ref 8.9–10.3)
Chloride: 102 mmol/L (ref 98–111)
Creatinine, Ser: 1.04 mg/dL — ABNORMAL HIGH (ref 0.44–1.00)
GFR calc Af Amer: >60 mL/min (ref >60)
GFR calc non Af Amer: 59 mL/min — ABNORMAL LOW (ref >60)
Glucose, Bld: 184 mg/dL — ABNORMAL HIGH (ref 70–99)
Potassium: 3.9 mmol/L (ref 3.5–5.1)
Sodium: 135 mmol/L (ref 135–145)

## 2020-05-22 MED ORDER — ALBUTEROL SULFATE (2.5 MG/3ML) 0.083% IN NEBU: 2.5000 mg | INHALATION_SOLUTION | Freq: Four times a day (QID) | RESPIRATORY_TRACT | Status: DC | PRN

## 2020-05-22 MED ORDER — DOXYCYCLINE HYCLATE 100 MG PO TABS
100.0000 mg | ORAL_TABLET | Freq: Two times a day (BID) | ORAL | Status: DC
  Administered 2020-05-22 – 2020-05-24 (×5): 100 mg via ORAL
  Filled 2020-05-22 (×5): qty 1

## 2020-05-22 MED ORDER — PREDNISONE 20 MG PO TABS
40.0000 mg | ORAL_TABLET | Freq: Every day | ORAL | Status: AC
  Administered 2020-05-22 – 2020-05-23 (×2): 40 mg via ORAL
  Filled 2020-05-22 (×2): qty 2

## 2020-05-22 MED ORDER — IPRATROPIUM BROMIDE 0.02 % IN SOLN: 0.5000 mg | Freq: Four times a day (QID) | RESPIRATORY_TRACT | Status: DC | PRN

## 2020-05-22 MED ORDER — GUAIFENESIN-DM 100-10 MG/5ML PO SYRP
5.0000 mL | ORAL_SOLUTION | ORAL | Status: DC | PRN
  Administered 2020-05-22 – 2020-05-24 (×4): 5 mL via ORAL
  Filled 2020-05-22 (×4): qty 5

## 2020-05-22 NOTE — Progress Notes (Signed)
Advanced Heart Failure Rounding Note   Subjective:    Remains weak. Says breathing is somewhat better. Denies CP.   Weight inaccurate. Out 1.3L with lasix  + coughing  CT chest. No PNA or PE  Objective:   Weight Range:  Vital Signs:   Temp:  [97.5 F (36.4 C)-99.1 F (37.3 C)] 98.1 F (36.7 C) (06/12 0749) Pulse Rate:  [68-92] 73 (06/12 0749) Resp:  [15-26] 20 (06/12 0749) BP: (103-122)/(67-93) 112/67 (06/12 0749) SpO2:  [95 %-100 %] 96 % (06/12 0749) Weight:  [46 kg-49 kg] 49 kg (06/12 0600) Last BM Date: 05/21/20  Weight change: Filed Weights   05/21/20 1818 05/22/20 0305 05/22/20 0600  Weight: 46 kg 46.6 kg 49 kg    Intake/Output:   Intake/Output Summary (Last 24 hours) at 05/22/2020 1056 Last data filed at 05/22/2020 0857 Gross per 24 hour  Intake 476 ml  Output 1400 ml  Net -924 ml     Physical Exam: General:  Weak appearing. + cough HEENT: normal Neck: supple. JVP flat. Carotids 2+ bilat; no bruits. No lymphadenopathy or thryomegaly appreciated. Cor: PMI nondisplaced. Regular rate & rhythm. No rubs, gallops or murmurs. Lungs: + rhonchi and wheezing Abdomen: soft, nontender, nondistended. No hepatosplenomegaly. No bruits or masses. Good bowel sounds. Extremities: no cyanosis, clubbing, rash, edema Neuro: alert & orientedx3, cranial nerves grossly intact. moves all 4 extremities w/o difficulty. Affect pleasant  Telemetry: Sinus 70s Personally reviewed   Labs: Basic Metabolic Panel: Recent Labs  Lab 05/20/20 1423 05/21/20 1102 05/21/20 1841 05/22/20 0541  NA 141 140  --  135  K 3.2* 3.9  --  3.9  CL 105 105  --  102  CO2 22 25  --  21*  GLUCOSE 117* 123*  --  184*  BUN 11 8  --  17  CREATININE 1.11* 0.88 1.05* 1.04*  CALCIUM 9.8 9.3  --  9.5    Liver Function Tests: Recent Labs  Lab 05/20/20 1423 05/21/20 1102  AST 21 22  ALT 14 12  ALKPHOS 107 107  BILITOT 1.2 1.0  PROT 7.4 7.1  ALBUMIN 4.1 3.9   No results for input(s):  LIPASE, AMYLASE in the last 168 hours. No results for input(s): AMMONIA in the last 168 hours.  CBC: Recent Labs  Lab 05/20/20 1423 05/21/20 1102 05/21/20 1841  WBC 8.0 7.3 8.1  NEUTROABS  --  5.2  --   HGB 16.5* 15.6* 17.1*  HCT 52.3* 50.1* 52.9*  MCV 94.6 94.9 92.6  PLT 246 219 252    Cardiac Enzymes: No results for input(s): CKTOTAL, CKMB, CKMBINDEX, TROPONINI in the last 168 hours.  BNP: BNP (last 3 results) Recent Labs    06/12/19 1520 10/02/19 1707 05/21/20 1102  BNP 1,728.7* 1,083.5* 896.5*    ProBNP (last 3 results) No results for input(s): PROBNP in the last 8760 hours.    Other results:  Imaging: CT Angio Chest PE W and/or Wo Contrast  Result Date: 05/21/2020 CLINICAL DATA:  Shortness of breath, onset yesterday. Radiologic records demonstrates history of small cell lung cancer. EXAM: CT ANGIOGRAPHY CHEST WITH CONTRAST TECHNIQUE: Multidetector CT imaging of the chest was performed using the standard protocol during bolus administration of intravenous contrast. Multiplanar CT image reconstructions and MIPs were obtained to evaluate the vascular anatomy. CONTRAST:  42mL OMNIPAQUE IOHEXOL 350 MG/ML SOLN COMPARISON:  Radiograph earlier today.  Chest CT 01/27/2020 FINDINGS: Cardiovascular: There are no filling defects within the pulmonary arteries to suggest pulmonary embolus.  Right upper lobe pulmonary arteries are attenuated but no discrete filling defect. Left-sided pacemaker remains in place. Post CABG. Soft tissue density adjacent to the ascending aorta previously filled with IV contrast and is consistent with chronic outpouching, unchanged from previous. Coronary artery calcifications and stents. Multi chamber cardiomegaly. No significant pericardial effusion. Mediastinum/Nodes: Loss of fat plane with ill-defined soft tissue involving the distal trachea, carina, and right greater than left mainstem bronchus. This causes circumferential narrowing of the right lobar  bronchi. Overall appearance is similar to February 2021 exam. No discrete measurable lymph nodes. Esophageal wall thickening which was also seen on prior. Previous subcentimeter right thyroid nodule is not as well-defined on the current exam given phase of contrast. This was too small for further evaluation on prior. Lungs/Pleura: Right central perihilar soft tissue thickening appears unchanged from prior exam. Volume loss particularly in the right middle lobe and anterior right lower lobe with air bronchograms, unchanged. There is central bronchial thickening. Narrowing of the right mainstem bronchus again seen. Stable tiny subpleural right upper lobe nodule, series 8, image 42. Slight heterogeneous pulmonary parenchyma. Subsegmental atelectasis in the lingula and both lower lobes. No confluent consolidation. No septal thickening or pulmonary edema. Trace right pleural thickening without significant effusion. Upper Abdomen: No acute findings. Musculoskeletal: Tiny sclerotic density within T7 vertebral body, unchanged. Post median sternotomy. There are no acute or suspicious osseous abnormalities. Review of the MIP images confirms the above findings. IMPRESSION: 1. No pulmonary embolus. 2. Stable appearance of right perihilar consolidation, likely treatment related change in patient with history of small cell lung cancer. Overall appearance is similar to February 2021 exam. 3. Central bronchial thickening. Heterogeneous pulmonary parenchyma may be seen with small vessel or small airways disease. 4. Additional chronic findings are stable. Aortic Atherosclerosis (ICD10-I70.0). Electronically Signed   By: Keith Rake M.D.   On: 05/21/2020 16:56   DG Chest Portable 1 View  Result Date: 05/21/2020 CLINICAL DATA:  Shortness of breath since yesterday. EXAM: PORTABLE CHEST 1 VIEW COMPARISON:  10/02/2019 FINDINGS: Stable enlarged cardiac silhouette, post CABG changes and left subclavian AICD lead. The lungs remain  clear and hyperexpanded. Stable small amount of pleural thickening or fluid at the right lung base. Unremarkable bones. IMPRESSION: No acute abnormality. Stable cardiomegaly and changes of COPD. Electronically Signed   By: Claudie Revering M.D.   On: 05/21/2020 11:46      Medications:     Scheduled Medications:  aspirin EC  81 mg Oral Daily   atorvastatin  80 mg Oral q1800   furosemide  80 mg Intravenous BID   heparin  5,000 Units Subcutaneous Q8H   ivabradine  7.5 mg Oral BID WC   potassium chloride SA  20 mEq Oral Daily   sertraline  50 mg Oral Daily   sodium chloride flush  3 mL Intravenous Q12H   spironolactone  25 mg Oral Daily   ticagrelor  90 mg Oral BID     Infusions:  sodium chloride       PRN Medications:  sodium chloride, acetaminophen, ALPRAZolam, ipratropium-albuterol, nitroGLYCERIN, sodium chloride flush   Assessment/Plan:   1. Acute hypoxic respiratory failure - suspect mix of COPD and HF - volume status now looks better and now wheezing on exam  - BNP below baseline. CT without pulmonary edema - will treat for COPD flare  2. Acute on Chronic Systolic Heart Failure  - Echo 09/04/18 ([facility]) LVEF 20%, Moderate AI, Mild MR, Mild TR, Severe LAE, RV mildly decreased.  -  Echo 02/12/19: EF 25-30%, RV mildly reduced - BNP 895 (below baseline) but ReDs clip in [facility] on 6/10 was elevated at 53%.  - mild improvement with IV diuresis but acting more like COPD flare.  - will treat for COPD flare but I remained somewhat concerned for low output. May need PICC  3. Acute COPD exacerabation - suspect she has COPD. Former smoker. Known asthmatic  - lung exam and CXR c/w COPD.  - will start abx, nebs, steroids  4. CAD - Hx of NSTEMI/STEMI s/p Emergent CABG 02/03/18: Cath with severe 2v CAD as above with severe LV dysfunction/ICM. s/p Emergent CABG 02/03/18. - Had NSTEMI and now s/p LHC 02/12/19 with DES to the ostial ramus extending into the distal left main -  She denies CP but w/ increased dyspnea, suspect most likely pulmonary etiology + a/c CHF.  - Hs troponin c/w ACS, 24>>16   - continue medical therapy  -She has completed a year ofDAPT w/ASA and Brilinta.   5.H/oRecurrent pleural effusionss/p pleurodesis  - CT with no significant effusions  6. PAF - CHA2DS2/VASc is at least 4. (CHF, Vasc disease, HTN, Female).  - She has history of Afib RVR in the past in the setting of her Lung CA. Previously on Xarelto but stopped.  -Reviewed her most recent device interrogation from 04/05/20.No afibdetectionbyICD. NSR on EKG today -Continue corlanor 7.5 mg bid   7. 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  - Chest CT 02/19/18 with mass-like consolidation in R hilum concerning for recurrent tumor.  - Repeat CT yesterday was stable    Length of Stay: 1   Glori Bickers MD 05/22/2020, 10:56 AM  Advanced Heart Failure Team Pager [phone] (M-F; [place])  Please contact [facility] for night-coverage after hours (4p -7a ) and weekends on [email or web address]

## 2020-05-23 LAB — BASIC METABOLIC PANEL
Anion gap: 11 (ref 5–15)
BUN: 24 mg/dL — ABNORMAL HIGH (ref 6–20)
CO2: 24 mmol/L (ref 22–32)
Calcium: 10 mg/dL (ref 8.9–10.3)
Chloride: 103 mmol/L (ref 98–111)
Creatinine, Ser: 1.22 mg/dL — ABNORMAL HIGH (ref 0.44–1.00)
GFR calc Af Amer: 56 mL/min — ABNORMAL LOW (ref >60)
GFR calc non Af Amer: 48 mL/min — ABNORMAL LOW (ref >60)
Glucose, Bld: 182 mg/dL — ABNORMAL HIGH (ref 70–99)
Potassium: 3.7 mmol/L (ref 3.5–5.1)
Sodium: 138 mmol/L (ref 135–145)

## 2020-05-23 LAB — GLUCOSE, CAPILLARY
Glucose-Capillary: 209 mg/dL — ABNORMAL HIGH (ref 70–99)
Glucose-Capillary: 129 mg/dL — ABNORMAL HIGH (ref 70–99)

## 2020-05-23 MED ORDER — PREDNISONE 20 MG PO TABS
30.0000 mg | ORAL_TABLET | Freq: Every day | ORAL | Status: DC
  Administered 2020-05-24: 30 mg via ORAL
  Filled 2020-05-23: qty 1

## 2020-05-23 MED ORDER — IPRATROPIUM-ALBUTEROL 0.5-2.5 (3) MG/3ML IN SOLN
3.0000 mL | Freq: Four times a day (QID) | RESPIRATORY_TRACT | Status: DC
  Filled 2020-05-23 (×2): qty 3

## 2020-05-23 MED ORDER — IPRATROPIUM BROMIDE 0.02 % IN SOLN
0.5000 mg | Freq: Four times a day (QID) | RESPIRATORY_TRACT | Status: DC
  Administered 2020-05-23 (×2): 0.5 mg via RESPIRATORY_TRACT
  Filled 2020-05-23 (×2): qty 2.5

## 2020-05-23 MED ORDER — ALBUTEROL SULFATE (2.5 MG/3ML) 0.083% IN NEBU: 2.5000 mg | INHALATION_SOLUTION | RESPIRATORY_TRACT | Status: DC | PRN

## 2020-05-23 MED ORDER — ALBUTEROL SULFATE (2.5 MG/3ML) 0.083% IN NEBU
2.5000 mg | INHALATION_SOLUTION | Freq: Four times a day (QID) | RESPIRATORY_TRACT | Status: DC
  Administered 2020-05-23 (×2): 2.5 mg via RESPIRATORY_TRACT
  Filled 2020-05-23 (×2): qty 3

## 2020-05-23 MED ORDER — INSULIN ASPART 100 UNIT/ML ~~LOC~~ SOLN
2.0000 [IU] | SUBCUTANEOUS | Status: DC
  Administered 2020-05-23: 2 [IU] via SUBCUTANEOUS
  Administered 2020-05-23: 6 [IU] via SUBCUTANEOUS
  Administered 2020-05-24: 4 [IU] via SUBCUTANEOUS

## 2020-05-23 NOTE — Plan of Care (Signed)
  Problem: Nutrition: Goal: Adequate nutrition will be maintained Outcome: Completed/Met   Problem: Elimination: Goal: Will not experience complications related to bowel motility Outcome: Completed/Met Goal: Will not experience complications related to urinary retention Outcome: Completed/Met   Problem: Pain Managment: Goal: General experience of comfort will improve Outcome: Completed/Met   Problem: Safety: Goal: Ability to remain free from injury will improve Outcome: Completed/Met   Problem: Skin Integrity: Goal: Risk for impaired skin integrity will decrease Outcome: Completed/Met   Problem: Activity: Goal: Capacity to carry out activities will improve Outcome: Completed/Met

## 2020-05-23 NOTE — Progress Notes (Signed)
Pharmacist Heart Failure Core Measure Documentation  Assessment: Brittany [name] has an EF documented as 25-30% on 02/12/19 by ECHO.  Rationale: Heart failure patients with left ventricular systolic dysfunction (LVSD) and an EF < 40% should be prescribed an angiotensin converting enzyme inhibitor (ACEI) or angiotensin receptor blocker (ARB) at discharge unless a contraindication is documented in the medical record.  This patient is not currently on an ACEI or ARB for HF.  This note is being placed in the record in order to provide documentation that a contraindication to the use of these agents is present for this encounter.  ACE Inhibitor or Angiotensin Receptor Blocker is contraindicated (specify all that apply)  []   ACEI allergy AND ARB allergy []   Angioedema [x]   Moderate or severe aortic stenosis []   Hyperkalemia []   Hypotension []   Renal artery stenosis [x]   Worsening renal function, preexisting renal disease or dysfunction   Brittany [name], PharmD, BCPS, BCCP Clinical Pharmacist  Please check AMION for all [place] phone numbers After 10:00 PM, call [place]

## 2020-05-23 NOTE — Progress Notes (Signed)
   Received page from outpatient Answering Service. Patient is currently admitted and called reporting she felt like she was going to pass out. Blood sugars were in the 200's and she was wondering if that could be playing a role. I called patient back and she states primary team was about to give her insulin. I did go by the patient's room prior to leaving for the evening. She still states she feels a little lightheaded/dizziness and like she is going to pass even just sitting there. No palpitations. BP stable. She had one isolated reading of 83/48 but readings since then have been in the 120's/60-70's. Her lungs sound very course with some wheezing. Reviewed Dr. Clayborne Dana note from earlier today. Initially diuresed with only minimal improvement so stopped. Acting more like COPD exacerbation. Continue to monitor at this time. Can consider checking orthostatic vital signs tomorrow.   Darreld Mclean, PA-C 05/23/2020 6:14 PM

## 2020-05-23 NOTE — Progress Notes (Signed)
Advanced Heart Failure Rounding Note   Subjective:    Restarted steroids/abx and nebs yesterday. Remains on IV lasix  Feeling much better today. Walking halls. Still with some wheezing.  No orthopnea or PND. No CP.   Objective:   Weight Range:  Vital Signs:   Temp:  [97.6 F (36.4 C)-98.5 F (36.9 C)] 97.6 F (36.4 C) (06/13 0753) Pulse Rate:  [76-90] 79 (06/13 0753) Resp:  [15-28] 20 (06/13 0753) BP: (83-131)/(48-87) 120/65 (06/13 0753) SpO2:  [97 %-100 %] 98 % (06/13 0753) Weight:  [49 kg] 49 kg (06/13 0400) Last BM Date: 05/22/20  Weight change: Filed Weights   05/22/20 0305 05/22/20 0600 05/23/20 0400  Weight: 46.6 kg 49 kg 49 kg    Intake/Output:   Intake/Output Summary (Last 24 hours) at 05/23/2020 0904 Last data filed at 05/23/2020 0700 Gross per 24 hour  Intake 822 ml  Output 1250 ml  Net -428 ml     Physical Exam: General:  Thin weak appearing. No resp difficulty HEENT: normal Neck: supple. no JVD. Carotids 2+ bilat; no bruits. No lymphadenopathy or thryomegaly appreciated. Cor: PMI nondisplaced. Regular rate & rhythm. No rubs, gallops or murmurs. Lungs: bronchial breath sounds with mild wheeze Abdomen: soft, nontender, nondistended. No hepatosplenomegaly. No bruits or masses. Good bowel sounds. Extremities: no cyanosis, clubbing, rash, edema Neuro: alert & orientedx3, cranial nerves grossly intact. moves all 4 extremities w/o difficulty. Affect pleasant   Telemetry: Sinus 70-80s Personally reviewed   Labs: Basic Metabolic Panel: Recent Labs  Lab 05/20/20 1423 05/21/20 1102 05/21/20 1841 05/22/20 0541  NA 141 140  --  135  K 3.2* 3.9  --  3.9  CL 105 105  --  102  CO2 22 25  --  21*  GLUCOSE 117* 123*  --  184*  BUN 11 8  --  17  CREATININE 1.11* 0.88 1.05* 1.04*  CALCIUM 9.8 9.3  --  9.5    Liver Function Tests: Recent Labs  Lab 05/20/20 1423 05/21/20 1102  AST 21 22  ALT 14 12  ALKPHOS 107 107  BILITOT 1.2 1.0  PROT 7.4  7.1  ALBUMIN 4.1 3.9   No results for input(s): LIPASE, AMYLASE in the last 168 hours. No results for input(s): AMMONIA in the last 168 hours.  CBC: Recent Labs  Lab 05/20/20 1423 05/21/20 1102 05/21/20 1841  WBC 8.0 7.3 8.1  NEUTROABS  --  5.2  --   HGB 16.5* 15.6* 17.1*  HCT 52.3* 50.1* 52.9*  MCV 94.6 94.9 92.6  PLT 246 219 252    Cardiac Enzymes: No results for input(s): CKTOTAL, CKMB, CKMBINDEX, TROPONINI in the last 168 hours.  BNP: BNP (last 3 results) Recent Labs    06/12/19 1520 10/02/19 1707 05/21/20 1102  BNP 1,728.7* 1,083.5* 896.5*    ProBNP (last 3 results) No results for input(s): PROBNP in the last 8760 hours.    Other results:  Imaging: CT Angio Chest PE W and/or Wo Contrast  Result Date: 05/21/2020 CLINICAL DATA:  Shortness of breath, onset yesterday. Radiologic records demonstrates history of small cell lung cancer. EXAM: CT ANGIOGRAPHY CHEST WITH CONTRAST TECHNIQUE: Multidetector CT imaging of the chest was performed using the standard protocol during bolus administration of intravenous contrast. Multiplanar CT image reconstructions and MIPs were obtained to evaluate the vascular anatomy. CONTRAST:  42mL OMNIPAQUE IOHEXOL 350 MG/ML SOLN COMPARISON:  Radiograph earlier today.  Chest CT 01/27/2020 FINDINGS: Cardiovascular: There are no filling defects within the pulmonary  arteries to suggest pulmonary embolus. Right upper lobe pulmonary arteries are attenuated but no discrete filling defect. Left-sided pacemaker remains in place. Post CABG. Soft tissue density adjacent to the ascending aorta previously filled with IV contrast and is consistent with chronic outpouching, unchanged from previous. Coronary artery calcifications and stents. Multi chamber cardiomegaly. No significant pericardial effusion. Mediastinum/Nodes: Loss of fat plane with ill-defined soft tissue involving the distal trachea, carina, and right greater than left mainstem bronchus. This  causes circumferential narrowing of the right lobar bronchi. Overall appearance is similar to February 2021 exam. No discrete measurable lymph nodes. Esophageal wall thickening which was also seen on prior. Previous subcentimeter right thyroid nodule is not as well-defined on the current exam given phase of contrast. This was too small for further evaluation on prior. Lungs/Pleura: Right central perihilar soft tissue thickening appears unchanged from prior exam. Volume loss particularly in the right middle lobe and anterior right lower lobe with air bronchograms, unchanged. There is central bronchial thickening. Narrowing of the right mainstem bronchus again seen. Stable tiny subpleural right upper lobe nodule, series 8, image 42. Slight heterogeneous pulmonary parenchyma. Subsegmental atelectasis in the lingula and both lower lobes. No confluent consolidation. No septal thickening or pulmonary edema. Trace right pleural thickening without significant effusion. Upper Abdomen: No acute findings. Musculoskeletal: Tiny sclerotic density within T7 vertebral body, unchanged. Post median sternotomy. There are no acute or suspicious osseous abnormalities. Review of the MIP images confirms the above findings. IMPRESSION: 1. No pulmonary embolus. 2. Stable appearance of right perihilar consolidation, likely treatment related change in patient with history of small cell lung cancer. Overall appearance is similar to February 2021 exam. 3. Central bronchial thickening. Heterogeneous pulmonary parenchyma may be seen with small vessel or small airways disease. 4. Additional chronic findings are stable. Aortic Atherosclerosis (ICD10-I70.0). Electronically Signed   By: Keith Rake M.D.   On: 05/21/2020 16:56   DG Chest Portable 1 View  Result Date: 05/21/2020 CLINICAL DATA:  Shortness of breath since yesterday. EXAM: PORTABLE CHEST 1 VIEW COMPARISON:  10/02/2019 FINDINGS: Stable enlarged cardiac silhouette, post CABG  changes and left subclavian AICD lead. The lungs remain clear and hyperexpanded. Stable small amount of pleural thickening or fluid at the right lung base. Unremarkable bones. IMPRESSION: No acute abnormality. Stable cardiomegaly and changes of COPD. Electronically Signed   By: Claudie Revering M.D.   On: 05/21/2020 11:46     Medications:     Scheduled Medications: . aspirin EC  81 mg Oral Daily  . atorvastatin  80 mg Oral q1800  . doxycycline  100 mg Oral Q12H  . heparin  5,000 Units Subcutaneous Q8H  . ivabradine  7.5 mg Oral BID WC  . potassium chloride SA  20 mEq Oral Daily  . sertraline  50 mg Oral Daily  . sodium chloride flush  3 mL Intravenous Q12H  . spironolactone  25 mg Oral Daily  . ticagrelor  90 mg Oral BID    Infusions: . sodium chloride      PRN Medications: sodium chloride, acetaminophen, albuterol, ALPRAZolam, guaiFENesin-dextromethorphan, ipratropium, nitroGLYCERIN, sodium chloride flush   Assessment/Plan:   1. Acute hypoxic respiratory failure - suspect mix of COPD and HF - volume status now looks better and now wheezing on exam  - BNP below baseline. CT without pulmonary edema or PE - improving today with treatment of COPD flare but still wheezing.   2. Acute on Chronic Systolic Heart Failure  - Echo 09/04/18 ([facility]) LVEF 20%, Moderate  AI, Mild MR, Mild TR, Severe LAE, RV mildly decreased.  - Echo 02/12/19: EF 25-30%, RV mildly reduced - BNP 895 (below baseline) but ReDs clip in [facility] on 6/10 was elevated at 53%.  - mild improvement with IV diuresis but acting more like COPD flare.  - now treating for COPD and HF. Can consider RHC as needed - renal function stable  3. Acute COPD exacerabation - suspect she has COPD. Former smoker. Known asthmatic  - improving with abx, nebs, steroids but stil lwheezing  4. CAD - Hx of NSTEMI/STEMI s/p Emergent CABG 02/03/18: Cath with severe 2v CAD as above with severe LV dysfunction/ICM. s/p Emergent CABG 02/03/18. -  Had NSTEMI and now s/p LHC 02/12/19 with DES to the ostial ramus extending into the distal left main - She denies CP but w/ increased dyspnea, suspect most likely pulmonary etiology + a/c CHF.  - Hs troponin c/w ACS, 24>>16  No s/s ACS - continue medical therapy  -She has completed a year ofDAPT w/ASA and Brilinta.   5.H/oRecurrent pleural effusionss/p pleurodesis  - CT with no significant effusions  6. PAF - CHA2DS2/VASc is at least 4. (CHF, Vasc disease, HTN, Female).  - She has history of Afib RVR in the past in the setting of her Lung CA. Previously on Xarelto but stopped.  -Reviewed her most recent device interrogation from 04/05/20.No afibdetectionbyICD. NSR on EKG today -Continue corlanor 7.5 mg bid   7. 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  - Chest CT 02/19/18 with mass-like consolidation in R hilum concerning for recurrent tumor.  - Repeat CT this admit was stable    Length of Stay: 2   Glori Bickers MD 05/23/2020, 9:04 AM  Advanced Heart Failure Team Pager [phone] (M-F; [place])  Please contact [facility] for night-coverage after hours (4p -7a ) and weekends on [email or web address]

## 2020-05-24 LAB — BASIC METABOLIC PANEL
Anion gap: 11 (ref 5–15)
BUN: 26 mg/dL — ABNORMAL HIGH (ref 6–20)
CO2: 22 mmol/L (ref 22–32)
Calcium: 9.8 mg/dL (ref 8.9–10.3)
Chloride: 104 mmol/L (ref 98–111)
Creatinine, Ser: 1.01 mg/dL — ABNORMAL HIGH (ref 0.44–1.00)
GFR calc Af Amer: >60 mL/min (ref >60)
GFR calc non Af Amer: >60 mL/min (ref >60)
Glucose, Bld: 101 mg/dL — ABNORMAL HIGH (ref 70–99)
Potassium: 3.7 mmol/L (ref 3.5–5.1)
Sodium: 137 mmol/L (ref 135–145)

## 2020-05-24 LAB — GLUCOSE, CAPILLARY
Glucose-Capillary: 105 mg/dL — ABNORMAL HIGH (ref 70–99)
Glucose-Capillary: 108 mg/dL — ABNORMAL HIGH (ref 70–99)
Glucose-Capillary: 109 mg/dL — ABNORMAL HIGH (ref 70–99)
Glucose-Capillary: 154 mg/dL — ABNORMAL HIGH (ref 70–99)

## 2020-05-24 MED ORDER — PREDNISONE 10 MG PO TABS: ORAL_TABLET | ORAL | 0 refills | Status: DC

## 2020-05-24 MED ORDER — PREDNISONE 20 MG PO TABS: 20.0000 mg | ORAL_TABLET | Freq: Every day | ORAL | Status: DC

## 2020-05-24 MED ORDER — DOXYCYCLINE HYCLATE 100 MG PO TABS: 100.0000 mg | ORAL_TABLET | Freq: Two times a day (BID) | ORAL | 0 refills | Status: DC

## 2020-05-24 MED ORDER — TORSEMIDE 20 MG PO TABS: 60.0000 mg | ORAL_TABLET | Freq: Every day | ORAL | 3 refills | Status: DC

## 2020-05-24 MED ORDER — PREDNISONE 10 MG PO TABS: 10.0000 mg | ORAL_TABLET | Freq: Every day | ORAL | Status: DC

## 2020-05-24 MED FILL — DOXYCYCLINE HYCLATE 100 MG: 100 | 5 days supply | Qty: 10 | Fill #0

## 2020-05-24 MED FILL — predniSONE 10 MG TABS: 10 | 4 days supply | Qty: 6 | Fill #0

## 2020-05-24 NOTE — Discharge Summary (Addendum)
Advanced Heart Failure Team  Discharge Summary   Patient ID: Brittany [name] MRN: [id number], DOB/AGE: 60-Jan-1962 60 y.o. Admit date: 05/21/2020 D/C date:     05/24/2020   Primary Discharge Diagnoses:  1. Acute hypoxic respiratory failure 2. Acute on Chronic Systolic Heart Failure  3.AcuteCOPDexacerabation 4. CAD 5.H/oRecurrent pleural effusionss/p pleurodesis 6. PAF 7. H/o SCLC: s/p treatment 2015.  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  Admitted with hypoxia--> A/C systolic heart failure + AECOPD. Diuresed with IV lasix and transitioned back to home regimen. COPD flare treated with antibiotics, nebulizers, and steroids. Plan to continue antibiotic course for 7 total of days + steroid taper.   She improved and was able walk up the hall without oxygen. She has oxygen at home and continue to use as needed.   See below for detailed problem list.   1. Acute hypoxic respiratory failure - suspect mix of COPD and HF - No wheeze. Sats stable on room air.  - BNP below baseline. CT without pulmonary edema or PE - Suspect Acute COPD . Continue doxycyline for total of 7 days + steroid taper.   2. Acute on Chronic Systolic Heart Failure  - Echo 09/04/18 ([facility]) LVEF 20%, Moderate AI, Mild MR, Mild TR, Severe LAE, RV mildly decreased.  - Echo 02/12/19: EF 25-30%, RV mildly reduced - BNP 895 (below baseline) but ReDs clip in [facility] on 6/10 was elevated at 53%.  - Diuresed with IV lasix and transitioned back to home torsemide dose.  - Treated for COPD and HF.  - Renal function remained stable.   3.AcuteCOPDexacerabation - suspect she has COPD. Former smoker. Known asthmatic  - improved  with abx, nebs, steroids.  - Continue steroid  taper + doxycycline.   4. CAD - Hx of NSTEMI/STEMI s/p Emergent CABG 02/03/18: Cath with severe 2v CAD as above with severe LV dysfunction/ICM. s/p Emergent CABG 02/03/18. - Had NSTEMI and now s/p LHC 02/12/19 with DES to the ostial ramus extending into the distal left main - She denies CP but w/ increased dyspnea, suspect most likely pulmonary etiology + a/c CHF.  - Hs troponin c/w ACS, 24>>16. No chest pain.  - continue medical therapy  -She has completed a year ofDAPT w/ASA and Brilinta.   5.H/oRecurrent pleural effusionss/p pleurodesis - CT with no significant effusions  6. PAF - CHA2DS2/VASc is at least 4. (CHF, Vasc disease, HTN, Female).  - She has history of Afib RVR in the past in the setting of her Lung CA. Previously on Xarelto but stopped.  -Reviewed her most recent device interrogation from 04/05/20.No afibdetectionbyICD.  - Maintained NSR.  -Continue corlanor 7.5 mg bid   7. 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  - Chest CT 02/19/18 with mass-like consolidation in R hilum concerning for recurrent tumor.  - Repeat CT this admit was stable   Discharge Vitals: Blood pressure 123/75, pulse 94, temperature 98.7 F (37.1 C), temperature source Oral, resp. rate 18, height 4\' 9"  (1.448 m), weight 49.8 kg, SpO2 97 %.  Labs: Lab Results  Component Value Date   WBC 8.1 05/21/2020   HGB 17.1 (H) 05/21/2020   HCT 52.9 (H) 05/21/2020   MCV 92.6 05/21/2020   PLT 252 05/21/2020    Recent Labs  Lab 05/21/20  1102 05/21/20 1841 05/24/20 0534  NA 140   < > 137  K 3.9   < > 3.7  CL 105   < > 104  CO2 25   < > 22  BUN 8   < > 26*  CREATININE 0.88   < > 1.01*  CALCIUM 9.3   < > 9.8  PROT 7.1  --   --   BILITOT 1.0  --   --   ALKPHOS 107  --   --   ALT 12  --   --   AST 22  --   --   GLUCOSE 123*   < > 101*   < > = values in this interval not displayed.   Lab Results  Component Value Date   CHOL 264 (H) 05/20/2020   HDL 54  05/20/2020   LDLCALC 189 (H) 05/20/2020   TRIG 103 05/20/2020   BNP (last 3 results) Recent Labs    06/12/19 1520 10/02/19 1707 05/21/20 1102  BNP 1,728.7* 1,083.5* 896.5*    ProBNP (last 3 results) No results for input(s): PROBNP in the last 8760 hours.   Diagnostic Studies/Procedures   No results found.  Discharge Medications   Allergies as of 05/24/2020      Reactions   Codeine Nausea And Vomiting      Medication List    STOP taking these medications   oxyCODONE 5 MG immediate release tablet Commonly known as: Oxy IR/ROXICODONE     TAKE these medications   albuterol 108 (90 Base) MCG/ACT inhaler Commonly known as: VENTOLIN HFA Inhale 2 puffs into the lungs every 6 (six) hours as needed for wheezing or shortness of breath.   ALPRAZolam 0.25 MG tablet Commonly known as: XANAX Take 1 tablet (0.25 mg total) by mouth daily as needed for anxiety.   aspirin 81 MG EC tablet Take 1 tablet (81 mg total) by mouth daily.   atorvastatin 80 MG tablet Commonly known as: LIPITOR Take 1 tablet (80 mg total) by mouth daily at 6 PM.   doxycycline 100 MG tablet Commonly known as: VIBRA-TABS Take 1 tablet (100 mg total) by mouth every 12 (twelve) hours.   ipratropium-albuterol 0.5-2.5 (3) MG/3ML Soln Commonly known as: DUONEB Take 3 mLs by nebulization 2 (two) times daily. What changed:   when to take this  reasons to take this   ivabradine 7.5 MG Tabs tablet Commonly known as: CORLANOR Take 1 tablet (7.5 mg total) by mouth 2 (two) times daily with a meal.   nitroGLYCERIN 0.4 MG SL tablet Commonly known as: NITROSTAT Place 1 tablet (0.4 mg total) under the tongue every 5 (five) minutes as needed for chest pain.   potassium chloride SA 20 MEQ tablet Commonly known as: KLOR-CON Take 1 tablet (20 mEq total) by mouth daily.   predniSONE 10 MG tablet Commonly known as: DELTASONE Take 20 mg daily x2 days then 10 mg x2 days Start taking on: May 25, 2020    sertraline 50 MG tablet Commonly known as: ZOLOFT Take 1 tablet (50 mg total) by mouth daily.   spironolactone 25 MG tablet Commonly known as: ALDACTONE Take 1 tablet (25 mg total) by mouth daily.   ticagrelor 90 MG Tabs tablet Commonly known as: BRILINTA Take 1 tablet (90 mg total) by mouth 2 (two) times daily.   torsemide 20 MG tablet Commonly known as: DEMADEX Take 3 tablets (60 mg total) by mouth daily. Take additional as directed by HF team. Take 70mg  (3.5  tabs) on Monday and Friday only Start taking on: May 25, 2020 What changed: additional instructions       Disposition   The patient will be discharged in stable condition to home. Discharge Instructions    (HEART FAILURE PATIENTS) Call MD:  Anytime you have any of the following symptoms: 1) 3 pound weight gain in 24 hours or 5 pounds in 1 week 2) shortness of breath, with or without a dry hacking cough 3) swelling in the hands, feet or stomach 4) if you have to sleep on extra pillows at night in order to breathe.   Complete by: As directed    Diet - low sodium heart healthy   Complete by: As directed    Heart Failure patients record your daily weight using the same scale at the same time of day   Complete by: As directed    Increase activity slowly   Complete by: As directed       Follow-up Information    [facility] Follow up on 06/01/2020.   Specialty: Cardiology Why: at 10:30 [place] information: [address] [id number] [place] [phone]                Duration of Discharge Encounter: Greater than 35 minutes   Signed, Amy Clegg NP-C  05/24/2020, 9:40 AM  Agree with above. See rounding note for details. [facility] for d/c today. Continue abx and steroid taper.   Glori Bickers, MD  1:18 PM

## 2020-05-24 NOTE — Progress Notes (Addendum)
Advanced Heart Failure Rounding Note   Subjective:    Feeling much better. Able to walk down the hall.   Objective:   Weight Range:  Vital Signs:   Temp:  [98.1 F (36.7 C)-98.7 F (37.1 C)] 98.7 F (37.1 C) (06/14 0838) Pulse Rate:  [74-94] 94 (06/14 0838) Resp:  [18-20] 18 (06/14 0838) BP: (111-123)/(66-76) 123/75 (06/14 0838) SpO2:  [97 %-100 %] 97 % (06/14 0838) FiO2 (%):  [28 %] 28 % (06/13 1409) Weight:  [49.8 kg] 49.8 kg (06/14 0407) Last BM Date: 05/22/20  Weight change: Filed Weights   05/22/20 0600 05/23/20 0400 05/24/20 0407  Weight: 49 kg 49 kg 49.8 kg    Intake/Output:   Intake/Output Summary (Last 24 hours) at 05/24/2020 0926 Last data filed at 05/24/2020 0847 Gross per 24 hour  Intake 360 ml  Output 375 ml  Net -15 ml     Physical Exam: General:  Thin.  No resp difficulty HEENT: normal Neck: supple. no JVD. Carotids 2+ bilat; no bruits. No lymphadenopathy or thryomegaly appreciated. Cor: PMI nondisplaced. Regular rate & rhythm. No rubs, gallops or murmurs. Lungs: clear no wheeze. On room air.  Abdomen: soft, nontender, nondistended. No hepatosplenomegaly. No bruits or masses. Good bowel sounds. Extremities: no cyanosis, clubbing, rash, edema Neuro: alert & orientedx3, cranial nerves grossly intact. moves all 4 extremities w/o difficulty. Affect pleasant   Telemetry: SR 70-80s    Labs: Basic Metabolic Panel: Recent Labs  Lab 05/20/20 1423 05/20/20 1423 05/21/20 1102 05/21/20 1102 05/21/20 1841 05/22/20 0541 05/23/20 0822 05/24/20 0534  NA 141  --  140  --   --  135 138 137  K 3.2*  --  3.9  --   --  3.9 3.7 3.7  CL 105  --  105  --   --  102 103 104  CO2 22  --  25  --   --  21* 24 22  GLUCOSE 117*  --  123*  --   --  184* 182* 101*  BUN 11  --  8  --   --  17 24* 26*  CREATININE 1.11*   < > 0.88  --  1.05* 1.04* 1.22* 1.01*  CALCIUM 9.8   < > 9.3   < >  --  9.5 10.0 9.8   < > = values in this interval not displayed.     Liver Function Tests: Recent Labs  Lab 05/20/20 1423 05/21/20 1102  AST 21 22  ALT 14 12  ALKPHOS 107 107  BILITOT 1.2 1.0  PROT 7.4 7.1  ALBUMIN 4.1 3.9   No results for input(s): LIPASE, AMYLASE in the last 168 hours. No results for input(s): AMMONIA in the last 168 hours.  CBC: Recent Labs  Lab 05/20/20 1423 05/21/20 1102 05/21/20 1841  WBC 8.0 7.3 8.1  NEUTROABS  --  5.2  --   HGB 16.5* 15.6* 17.1*  HCT 52.3* 50.1* 52.9*  MCV 94.6 94.9 92.6  PLT 246 219 252    Cardiac Enzymes: No results for input(s): CKTOTAL, CKMB, CKMBINDEX, TROPONINI in the last 168 hours.  BNP: BNP (last 3 results) Recent Labs    06/12/19 1520 10/02/19 1707 05/21/20 1102  BNP 1,728.7* 1,083.5* 896.5*    ProBNP (last 3 results) No results for input(s): PROBNP in the last 8760 hours.    Other results:  Imaging: No results found.   Medications:     Scheduled Medications: . aspirin EC  81 mg  Oral Daily  . atorvastatin  80 mg Oral q1800  . doxycycline  100 mg Oral Q12H  . heparin  5,000 Units Subcutaneous Q8H  . insulin aspart  2-6 Units Subcutaneous Q4H  . ipratropium-albuterol  3 mL Nebulization Q6H  . ivabradine  7.5 mg Oral BID WC  . potassium chloride SA  20 mEq Oral Daily  . predniSONE  30 mg Oral Q breakfast  . sertraline  50 mg Oral Daily  . sodium chloride flush  3 mL Intravenous Q12H  . spironolactone  25 mg Oral Daily  . ticagrelor  90 mg Oral BID    Infusions: . sodium chloride      PRN Medications: sodium chloride, acetaminophen, albuterol, ALPRAZolam, guaiFENesin-dextromethorphan, nitroGLYCERIN, sodium chloride flush   Assessment/Plan:   1. Acute hypoxic respiratory failure - suspect mix of COPD and HF - volume status now looks better and now wheezing on exam  - BNP below baseline. CT without pulmonary edema or PE - Much improved. Suspect Acute COPD  flare. Continue doxycyline for total of 7 days + steroid taper.   2. Acute on Chronic  Systolic Heart Failure  - Echo 09/04/18 ([facility]) LVEF 20%, Moderate AI, Mild MR, Mild TR, Severe LAE, RV mildly decreased.  - Echo 02/12/19: EF 25-30%, RV mildly reduced - BNP 895 (below baseline) but ReDs clip in [facility] on 6/10 was elevated at 53%.  - mild improvement with IV diuresis but acting more like COPD flare.  - now treating for COPD and HF. Can consider RHC as needed - Renal function stable.   3. Acute COPD exacerabation - suspect she has COPD. Former smoker. Known asthmatic  - improved  with abx, nebs, steroids - Continue steroid taper + doxycycline.   4. CAD - Hx of NSTEMI/STEMI s/p Emergent CABG 02/03/18: Cath with severe 2v CAD as above with severe LV dysfunction/ICM. s/p Emergent CABG 02/03/18. - Had NSTEMI and now s/p LHC 02/12/19 with DES to the ostial ramus extending into the distal left main - She denies CP but w/ increased dyspnea, suspect most likely pulmonary etiology + a/c CHF.  - Hs troponin c/w ACS, 24>>16. No chest pain.  - continue medical therapy  -She has completed a year ofDAPT w/ASA and Brilinta.   5.H/oRecurrent pleural effusionss/p pleurodesis  - CT with no significant effusions  6. PAF - CHA2DS2/VASc is at least 4. (CHF, Vasc disease, HTN, Female).  - She has history of Afib RVR in the past in the setting of her Lung CA. Previously on Xarelto but stopped.  -Reviewed her most recent device interrogation from 04/05/20.No afibdetectionbyICD.  -Continue corlanor 7.5 mg bid   7. 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  - Chest CT 02/19/18 with mass-like consolidation in R hilum concerning for recurrent tumor.  - Repeat CT this admit was stable  Home today.   Length of Stay: 3   Amy Clegg NP-C  05/24/2020, 9:26 AM  Advanced Heart Failure Team Pager [phone] (M-F; 7a - 4p)  Please contact [facility] for night-coverage after hours (4p -7a ) and weekends on [email or web address]  Patient seen and examined with the above-signed  Advanced Practice Provider and/or Housestaff. I personally reviewed laboratory data, imaging studies and relevant notes. I independently examined the patient and formulated the important aspects of the plan. I have edited the note to reflect any of my changes or salient points. I have personally discussed the plan with the patient and/or family.  Breathing much better. No  further wheezing. Volume status looks good. Agree with d/c today. Complete abx and steroid taper. She has nebulizer at home.   General:  Well appearing. No resp difficulty HEENT: normal Neck: supple. no JVD. Carotids 2+ bilat; no bruits. No lymphadenopathy or thryomegaly appreciated. Cor: PMI nondisplaced. Regular rate & rhythm. No rubs, gallops or murmurs. Lungs: cmild bronchial breath sounds no wheeze Abdomen: soft, nontender, nondistended. No hepatosplenomegaly. No bruits or masses. Good bowel sounds. Extremities: no cyanosis, clubbing, rash, edema Neuro: alert & orientedx3, cranial nerves grossly intact. moves all 4 extremities w/o difficulty. Affect pleasant  Home today. Plan as above. F/u in HF Clinic.   Glori Bickers, MD  1:17 PM

## 2020-05-24 NOTE — Plan of Care (Signed)
  Problem: Education: Goal: Knowledge of General Education information will improve Description: Including pain rating scale, medication(s)/side effects and non-pharmacologic comfort measures Outcome: Adequate for Discharge   

## 2020-05-24 NOTE — Progress Notes (Signed)
Discharge: Pt d/c from room via wheelchair, Family member with the pt. Discharge instructions given to the patient.  No questions from pt, reintegrated to the pt to call or go to the ED for chest discomfort. Pt dressed in street clothes and left with discharge papers  in hand. IV d/ced, tele removed and no complaints of pain or discomfort.

## 2020-05-24 NOTE — Plan of Care (Signed)
  Problem: Clinical Measurements: Goal: Ability to maintain clinical measurements within normal limits will improve Outcome: Completed/Met Goal: Will remain free from infection Outcome: Completed/Met   Problem: Coping: Goal: Level of anxiety will decrease Outcome: Completed/Met

## 2020-05-24 NOTE — Progress Notes (Signed)
Pt alert and oriented x4, no complaints of pain or discomfort.  Bed in low position, call bell within reach.  Bed alarms on and functioning.  Assessment done and charted.  Will continue to monitor and do hourly rounding throughout the shift 

## 2020-05-24 NOTE — Progress Notes (Signed)
Pt given discharge paperwork. Pharmacy delivered medications. Questions answered regarding discharge instructions. Waiting for her ride. Pt to dress her self IV and tele removed.

## 2020-06-01 ENCOUNTER — Encounter (HOSPITAL_COMMUNITY): Payer: BC Managed Care – PPO

## 2020-06-03 ENCOUNTER — Other Ambulatory Visit: Payer: Self-pay

## 2020-06-03 ENCOUNTER — Ambulatory Visit (HOSPITAL_COMMUNITY)
Admission: RE | Admit: 2020-06-03 | Discharge: 2020-06-03 | Disposition: A | Discharge status: Home / Self Care | Payer: BC Managed Care – PPO | Source: Ambulatory Visit | Attending: Internal Medicine | Admitting: Internal Medicine

## 2020-06-03 DIAGNOSIS — I739 Peripheral vascular disease, unspecified: Secondary | ICD-10-CM | POA: Diagnosis not present

## 2020-06-07 ENCOUNTER — Ambulatory Visit (INDEPENDENT_AMBULATORY_CARE_PROVIDER_SITE_OTHER): Payer: BC Managed Care – PPO

## 2020-06-07 DIAGNOSIS — Z95810 Presence of automatic (implantable) cardiac defibrillator: Secondary | ICD-10-CM | POA: Diagnosis not present

## 2020-06-07 DIAGNOSIS — I5022 Chronic systolic (congestive) heart failure: Secondary | ICD-10-CM

## 2020-06-09 ENCOUNTER — Telehealth: Payer: Self-pay

## 2020-06-09 NOTE — Telephone Encounter (Signed)
Remote ICM transmission received.  Attempted call to patient regarding ICM remote transmission and no answer, voice mail box not set up.

## 2020-06-09 NOTE — Progress Notes (Signed)
EPIC Encounter for ICM Monitoring  Patient Name: Brittany [name] is a 59 y.o. female Date: 06/09/2020 Primary Care Physican: Scifres, [name], PA-C Primary Cardiologist:Bensimhon Electrophysiologist:Allred 5/28/2021Weight: 109 -110lbs   Attempted call to patient and unable to reach.  Transmission reviewed.  Hospitalization 6/11-614 with dx of COPD exacerbation and CHF  OptivolThoracic impedancenormal.  Prescribed:   Torsemide20 mgTake 3 tablets (60 mg total) by mouth daily. Take additional as directed by HF team. Take 70mg  (3.5 tabs) on Monday and Friday only  Potassium 20 mEq take 1 tablet daily  Spironolactone 25 mg take 1 tablet daily  Labs: 05/24/2020 Creatinine 1.01, BUN 26, Potassium 3.7, Sodium 137, GFR >60 05/23/2020 Creatinine 1.22, BUN 24, Potassium 3.7, Sodium 138, GFR 48-56  05/22/2020 Creatinine 1.04, BUN 17, Potassium 3.9, Sodium 135, GFR 59->60  05/21/2020 Creatinine 1.05, BUN 8,   Potassium 3.9, Sodium 140, GFR 58->60  05/20/2020 Creatinine 1.11, BUN 11, Potassium 3.2, Sodium 141, GFR 54->60  01/13/2020 Creatinine 1.19, BUN 20 Potassium 3.7, Sodium 143, GFR 50-58 A complete set of results can be found in Results Review.  Recommendations:Unable to reach.    Follow-up plan: ICM clinic phone appointment on8/01/2020. 91 day device clinic remote transmission7/26/2021.  EP/Cardiology Office Visits: 7/2/2021with Dr [name] and 10/10/2020 with Oda Kilts, PA.  3 month ICM trend: 06/07/2020    1 Year ICM trend:       [name], RN 06/09/2020 12:04 PM

## 2020-06-11 ENCOUNTER — Encounter (HOSPITAL_COMMUNITY): Payer: Self-pay | Admitting: Internal Medicine

## 2020-06-11 ENCOUNTER — Ambulatory Visit (HOSPITAL_COMMUNITY)
Admission: RE | Admit: 2020-06-11 | Discharge: 2020-06-11 | Disposition: A | Discharge status: Home / Self Care | Payer: BC Managed Care – PPO | Source: Ambulatory Visit | Attending: Internal Medicine | Admitting: Internal Medicine

## 2020-06-11 ENCOUNTER — Other Ambulatory Visit: Payer: Self-pay

## 2020-06-11 ENCOUNTER — Telehealth (HOSPITAL_COMMUNITY): Payer: Self-pay | Admitting: Pharmacy Technician

## 2020-06-11 VITALS — BP 100/60 | HR 88 | Wt 111.2 lb

## 2020-06-11 DIAGNOSIS — Z951 Presence of aortocoronary bypass graft: Secondary | ICD-10-CM | POA: Insufficient documentation

## 2020-06-11 DIAGNOSIS — Z7901 Long term (current) use of anticoagulants: Secondary | ICD-10-CM | POA: Insufficient documentation

## 2020-06-11 DIAGNOSIS — F419 Anxiety disorder, unspecified: Secondary | ICD-10-CM | POA: Diagnosis not present

## 2020-06-11 DIAGNOSIS — Z7982 Long term (current) use of aspirin: Secondary | ICD-10-CM | POA: Insufficient documentation

## 2020-06-11 DIAGNOSIS — I252 Old myocardial infarction: Secondary | ICD-10-CM | POA: Diagnosis not present

## 2020-06-11 DIAGNOSIS — Z95810 Presence of automatic (implantable) cardiac defibrillator: Secondary | ICD-10-CM | POA: Diagnosis not present

## 2020-06-11 DIAGNOSIS — Z955 Presence of coronary angioplasty implant and graft: Secondary | ICD-10-CM | POA: Diagnosis not present

## 2020-06-11 DIAGNOSIS — Z79899 Other long term (current) drug therapy: Secondary | ICD-10-CM | POA: Diagnosis not present

## 2020-06-11 DIAGNOSIS — I2510 Atherosclerotic heart disease of native coronary artery without angina pectoris: Secondary | ICD-10-CM | POA: Diagnosis not present

## 2020-06-11 DIAGNOSIS — Z87891 Personal history of nicotine dependence: Secondary | ICD-10-CM | POA: Diagnosis not present

## 2020-06-11 DIAGNOSIS — I739 Peripheral vascular disease, unspecified: Secondary | ICD-10-CM | POA: Diagnosis not present

## 2020-06-11 DIAGNOSIS — J449 Chronic obstructive pulmonary disease, unspecified: Secondary | ICD-10-CM | POA: Insufficient documentation

## 2020-06-11 DIAGNOSIS — Z8249 Family history of ischemic heart disease and other diseases of the circulatory system: Secondary | ICD-10-CM | POA: Diagnosis not present

## 2020-06-11 DIAGNOSIS — I110 Hypertensive heart disease with heart failure: Secondary | ICD-10-CM | POA: Insufficient documentation

## 2020-06-11 DIAGNOSIS — Z85118 Personal history of other malignant neoplasm of bronchus and lung: Secondary | ICD-10-CM | POA: Insufficient documentation

## 2020-06-11 DIAGNOSIS — I5042 Chronic combined systolic (congestive) and diastolic (congestive) heart failure: Secondary | ICD-10-CM | POA: Diagnosis not present

## 2020-06-11 DIAGNOSIS — I480 Paroxysmal atrial fibrillation: Secondary | ICD-10-CM

## 2020-06-11 DIAGNOSIS — Z885 Allergy status to narcotic agent status: Secondary | ICD-10-CM | POA: Diagnosis not present

## 2020-06-11 DIAGNOSIS — I5022 Chronic systolic (congestive) heart failure: Secondary | ICD-10-CM | POA: Diagnosis present

## 2020-06-11 DIAGNOSIS — Z923 Personal history of irradiation: Secondary | ICD-10-CM | POA: Insufficient documentation

## 2020-06-11 MED ORDER — DAPAGLIFLOZIN PROPANEDIOL 10 MG PO TABS
10.0000 mg | ORAL_TABLET | Freq: Every day | ORAL | 6 refills | Status: DC
Start: 2020-06-11 — End: 2021-08-16

## 2020-06-11 NOTE — Progress Notes (Signed)
Advanced HF Clinic Note   Date:  06/11/2020   ID:  Brittany [name], DOB [birth year], MRN [id number]  Location: Home  Provider location: [place] [facility] Failure Type of Visit: Established patient  PCP:  Scifres, [name], PA-C  Cardiologist:  No primary care provider on file. Primary HF: Dr. [name]   Chief Complaint/ Reason for Visit:  F/u for chronic systolic heart failure    History of Present Illness:  Brittany [name] a 59 y.o.femalewith a history of PAF,preveious smoker quit 5 years ago, hypertension, previous small cell lung cancer treated with chemo, chest XRT and prophylactic brain radiation in 2015, CAD s/p CABG and chronic systolic HF EF ~69%  Admitted 02/03/2018 with NSTEMI and shock. Underwent emergent cath 02/03/18 showed LAD 100% stenosed, LCx 95% stenosed. Taken for emergent CABG 02/03/18. Required impella post op. Hospital course complicated by cardiogenic shock, HCAP, A fib, respiratory failure, and swallowing issues. She was discharged to SNF. Discharge weight 103 pounds.   In 2019 had multiple hospitalizations for HF and pleural effusion. Underwent pleurodesis at [facility].  Admitted 3/4 - 02/14/2019 with NSTEMIand HF. Received DES to ostial ramus into distal left main based on cath belowand underwent diuresis. Meds adjusted as tolerated. Echo with EF 25-30%  Admitted 6/11-14/21 with hypoxia felt to be combination of COPD and HF. Improved mostly with steroids. D/c weight 110 pounds  Returns for post hospital f/u. Feels much better. Can go to the store and go up steps without problem. Does yoga 2x/week. No edema, orthopnea, PNR or edema. No CP.   ReDS 35 %  ICD interrogation. No VT/AF. Fluid trending up slightly but below threshold. Activity 2h/day Personally reviewed   Past Medical History:  Diagnosis Date  . Acute systolic congestive heart failure ([name]) 02/03/2018  . Allergy   . Anxiety   . Asthma   . Hypertension   . PAF  (paroxysmal atrial fibrillation) ([place])   . Prophylactic measure 08/03/14-08/19/14   Prophyl. cranial radiation 24 Gy  . 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 Small cell lung cancer ([name]) 03/16/2014   Past Surgical History:  Procedure Laterality Date  . CARDIAC DEFIBRILLATOR PLACEMENT  08/15/2018   MDT Visia AF MRI VR ICD implanted by Dr Loel Lofty for primary prevention of sudden  . CESAREAN SECTION    . CORONARY ARTERY BYPASS GRAFT N/A 02/03/2018   Procedure: CORONARY ARTERY BYPASS GRAFTING (CABG);  Surgeon: Rexene Alberts, MD;  Location: [place];  Service: Open Heart Surgery;  Laterality: N/A;  Time 3 using left internal mammary artery and endoscopically harvested right saphenous vein  . CORONARY BALLOON ANGIOPLASTY N/A 02/03/2018   Procedure: CORONARY BALLOON ANGIOPLASTY;  Surgeon: Martinique, Peter M, MD;  Location: [place] [facility];  Service: Cardiovascular;  Laterality: N/A;  . CORONARY STENT INTERVENTION N/A 02/12/2019   Procedure: CORONARY STENT INTERVENTION;  Surgeon: Wellington Hampshire, MD;  Location: [place] [facility];  Service: Cardiovascular;  Laterality: N/A;  . CORONARY/GRAFT ACUTE MI REVASCULARIZATION N/A 02/03/2018   Procedure: Coronary/Graft Acute MI Revascularization;  Surgeon: Martinique, Peter M, MD;  Location: [place] [facility];  Service: Cardiovascular;  Laterality: N/A;  . IABP INSERTION N/A 02/03/2018   Procedure: IABP Insertion;  Surgeon: Martinique, Peter M, MD;  Location: [place] [facility];  Service: Cardiovascular;  Laterality: N/A;  . INTRAOPERATIVE TRANSESOPHAGEAL ECHOCARDIOGRAM  N/A 02/03/2018   Procedure: INTRAOPERATIVE TRANSESOPHAGEAL ECHOCARDIOGRAM;  Surgeon: Rexene Alberts, MD;  Location: [place];  Service: Open Heart Surgery;  Laterality: N/A;  . LEFT HEART CATH AND CORONARY ANGIOGRAPHY N/A 02/03/2018   Procedure: LEFT HEART CATH AND CORONARY ANGIOGRAPHY;   Surgeon: Martinique, Peter M, MD;  Location: [place] [facility];  Service: Cardiovascular;  Laterality: N/A;  . LEFT HEART CATH AND CORS/GRAFTS ANGIOGRAPHY N/A 02/12/2019   Procedure: LEFT HEART CATH AND CORS/GRAFTS ANGIOGRAPHY;  Surgeon: Wellington Hampshire, MD;  Location: [place] [facility];  Service: Cardiovascular;  Laterality: N/A;  . MEDIASTINOSCOPY N/A 03/11/2014   Procedure: MEDIASTINOSCOPY;  Surgeon: Melrose Nakayama, MD;  Location: [place];  Service: Thoracic;  Laterality: N/A;  . PLACEMENT OF [place] LEFT VENTRICULAR ASSIST DEVICE  02/03/2018   Procedure: PLACEMENT OF [place] LEFT VENTRICULAR ASSIST DEVICE LD;  Surgeon: Rexene Alberts, MD;  Location: [place];  Service: Open Heart Surgery;;  . REMOVAL OF IMPELLA LEFT VENTRICULAR ASSIST DEVICE N/A 02/08/2018   Procedure: REMOVAL OF [place] LEFT VENTRICULAR ASSIST DEVICE;  Surgeon: Rexene Alberts, MD;  Location: [place];  Service: Open Heart Surgery;  Laterality: N/A;  . RIGHT HEART CATH N/A 02/03/2018   Procedure: RIGHT HEART CATH;  Surgeon: Martinique, Peter M, MD;  Location: [place] [facility];  Service: Cardiovascular;  Laterality: N/A;  . RIGHT HEART CATH N/A 05/09/2018   Procedure: RIGHT HEART CATH;  Surgeon: Jolaine Artist, MD;  Location: [place] [facility];  Service: Cardiovascular;  Laterality: N/A;  . TEE WITHOUT CARDIOVERSION N/A 02/08/2018   Procedure: TRANSESOPHAGEAL ECHOCARDIOGRAM (TEE);  Surgeon: Rexene Alberts, MD;  Location: [place];  Service: Open Heart Surgery;  Laterality: N/A;  . TUBAL LIGATION    . VIDEO BRONCHOSCOPY WITH ENDOBRONCHIAL ULTRASOUND N/A 03/11/2014   Procedure: VIDEO BRONCHOSCOPY WITH ENDOBRONCHIAL ULTRASOUND;  Surgeon: Melrose Nakayama, MD;  Location: [place];  Service: Thoracic;  Laterality: N/A;     Current Outpatient Medications  Medication Sig Dispense Refill  . albuterol (VENTOLIN HFA) 108 (90 Base) MCG/ACT inhaler Inhale 2 puffs into the lungs every 6 (six) hours as needed for wheezing or shortness of  breath. 1 Inhaler 0  . ALPRAZolam (XANAX) 0.25 MG tablet Take 1 tablet (0.25 mg total) by mouth daily as needed for anxiety. 10 tablet 0  . aspirin 81 MG EC tablet Take 1 tablet (81 mg total) by mouth daily. 30 tablet 1  . atorvastatin (LIPITOR) 80 MG tablet Take 1 tablet (80 mg total) by mouth daily at 6 PM. 30 tablet 1  . doxycycline (VIBRA-TABS) 100 MG tablet Take 1 tablet (100 mg total) by mouth every 12 (twelve) hours. 10 tablet 0  . ipratropium-albuterol (DUONEB) 0.5-2.5 (3) MG/3ML SOLN Take 3 mLs by nebulization 2 (two) times daily as needed.    . ivabradine (CORLANOR) 7.5 MG TABS tablet Take 1 tablet (7.5 mg total) by mouth 2 (two) times daily with a meal. 60 tablet 6  . nitroGLYCERIN (NITROSTAT) 0.4 MG SL tablet Place 1 tablet (0.4 mg total) under the tongue every 5 (five) minutes as needed for chest pain. 90 tablet 5  . potassium chloride SA (K-DUR,KLOR-CON) 20 MEQ tablet Take 1 tablet (20 mEq total) by mouth daily. 90 tablet 3  . spironolactone (ALDACTONE) 25 MG tablet Take 1 tablet (25 mg total) by mouth daily. 30 tablet 6  . ticagrelor (BRILINTA) 90 MG TABS tablet Take 1 tablet (90 mg total) by mouth 2 (two) times daily.  60 tablet 6  . torsemide (DEMADEX) 20 MG tablet Take 3 tablets (60 mg total) by mouth daily. Take additional as directed by HF team. Take 70mg  (3.5 tabs) on Monday and Friday only 310 tablet 3   No current facility-administered medications for this encounter.    Allergies:   Codeine   Social History:  The patient  reports that she quit smoking about 6 years ago. Her smoking use included cigarettes. She has a 20.00 pack-year smoking history. She has never used smokeless tobacco. She reports current alcohol use of about 8.0 standard drinks of alcohol per week. She reports current drug use. Drug: Marijuana.   Family History:  The patient's family history includes Cancer in her maternal grandmother; Diabetes in her paternal grandmother; Heart attack in her mother; Heart  disease in her mother; Hypertension in her maternal grandmother and mother.   ROS:  Please see the history of present illness.   All other systems are personally reviewed and negative.   Vitals:   06/11/20 1034  BP: 100/60  Pulse: 88  SpO2: 99%  Weight: 50.4 kg    Exam:   General:  Well appearing. No resp difficulty HEENT: normal Neck: supple. no JVD. Carotids 2+ bilat; no bruits. No lymphadenopathy or thryomegaly appreciated. Cor: PMI nondisplaced. Regular rate & rhythm. No rubs, gallops or murmurs. Lungs: clear Abdomen: soft, nontender, nondistended. No hepatosplenomegaly. No bruits or masses. Good bowel sounds. Extremities: no cyanosis, clubbing, rash, edema Neuro: alert & orientedx3, cranial nerves grossly intact. moves all 4 extremities w/o difficulty. Affect pleasant   Recent Labs: 05/21/2020: ALT 12; B Natriuretic Peptide 896.5; Hemoglobin 17.1; Platelets 252 05/24/2020: BUN 26; Creatinine, Ser 1.01; Potassium 3.7; Sodium 137  Personally reviewed   Wt Readings from Last 3 Encounters:  06/11/20 50.4 kg (111 lb 3.2 oz)  05/24/20 49.8 kg (109 lb 11.2 oz)  05/20/20 49.4 kg (108 lb 12.8 oz)      ASSESSMENT AND PLAN:    1. Chronicsystolic HF due to ICM. S/p Medtronic single chamber ICD. -Echo 09/04/18 ([facility]) LVEF 20%, Moderate AI, Mild MR, Mild TR, Severe LAE, RV mildly decreased.  - Echo 02/12/19: EF 25-30%, RV mildly reduced - Doing very well after recent hospitalization. NYHA II - Continue torsemide 60 mg daily. Continue to take an extra 20 mg PRN if > 3lb wt gain in 24 hrs.   - Continue spiro 25 mg qHS.  - Continue corlanor 7.5 bid - Has not been able to tolerate losartan due to low BP - not on beta blocker w/ prior h/o low output  - Will start Farxiga 10. If getting dizzy cut back torsemide to 40 daily.   3. CAD  - Hx of NSTEMI/STEMI s/p Emergent CABG 02/03/18: Cath with severe 2v CAD as above with severe LV dysfunction/ICM. s/p Emergent CABG 02/03/18. - Had  NSTEMI and now s/p LHC 02/12/19 with DES to the ostial ramus extending into the distal left main - Continue DAPT/statin  4. H/o Recurrent pleural effusionss/p pleurodesis - She denies any significant dyspnea.    5. PAF - CHA2DS2/VASc is at least 4. (CHF, Vasc disease, HTN, Female).  - She has history of Afib RVR in the past in the setting of her Lung CA. Previously on Xarelto but stopped.  - Continue ASA and brilinta.  - has recently seen Dr Rayann Heman and had low AF burden on device. Wanted consider dropping DAPT and starting Eliquis. Will defer for now. May consider at next visit. She has struggled with  bleeding in past.   6. 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  - Chest CT 02/19/18 with mass-like consolidation in R hilum concerning for recurrent tumor.  - Repeat CT 03/27/2018 -No definite findings of locally recurrent tumor in the right lung. 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 Continued chest CT surveillance is advised in 3-6 months. - S/p thoracentesis 04/2018. Cytology negative for malignancy.  - CT chest 09/23/18 with slight increase in size of loculated right hydropneumothorax, status post thoracostomy tube removal. No evidence of tumor noted.  7. Lower Extremity Claudication/ discoloration  - Per pt sensation is intact. Denies numbness and tingling. No resting pain. Former smoker. Known coronary disease - increased risk for PAD - ABIs ok 06/03/20  Signed, Glori Bickers, MD  06/11/2020 11:04 AM  [facility] [address] [facility] [place] [address] [phone] (office) [phone] (fax)

## 2020-06-11 NOTE — Telephone Encounter (Signed)
Advanced Heart Failure Patient Advocate Encounter  Prior Authorization for [place] has been approved.    PA# [id number] Effective dates: 05/12/20 through 06/11/21  Patients co-pay is $15.00 (30 day supply)  Charlann Boxer, CPhT

## 2020-06-11 NOTE — Patient Instructions (Signed)
Start Farxiga 10 mg daily  Please call our office in October to schedule your echocardiogram and follow up appointment  If you have any questions or concerns before your next appointment please send [place] a message through [place] or call our office at [phone].    TO LEAVE A MESSAGE FOR THE NURSE SELECT OPTION 2, PLEASE LEAVE A MESSAGE INCLUDING: . YOUR NAME . DATE OF BIRTH . CALL BACK NUMBER . REASON FOR CALL**this is important as we prioritize the call backs  [place] AS LONG AS YOU CALL BEFORE 4:00 PM  At the [facility], you and your health needs are our priority. As part of our continuing mission to provide you with exceptional heart care, we have created designated Provider Care Teams. These Care Teams include your primary Cardiologist (physician) and Advanced Practice Providers (APPs- Physician Assistants and Nurse Practitioners) who all work together to provide you with the care you need, when you need it.   You may see any of the following providers on your designated Care Team at your next follow up: Marland Kitchen Dr Glori Bickers . Dr Loralie Champagne . Darrick Grinder, NP . Lyda Jester, PA . Audry Riles, PharmD   Please be sure to bring in all your medications bottles to every appointment.

## 2020-06-18 ENCOUNTER — Other Ambulatory Visit (HOSPITAL_COMMUNITY): Payer: Self-pay | Admitting: *Deleted

## 2020-06-18 MED ORDER — TORSEMIDE 20 MG PO TABS: 60.0000 mg | ORAL_TABLET | Freq: Every day | ORAL | 3 refills | Status: DC

## 2020-06-18 MED ORDER — TORSEMIDE 20 MG PO TABS: ORAL_TABLET | ORAL | 3 refills | Status: DC

## 2020-06-22 ENCOUNTER — Telehealth (HOSPITAL_COMMUNITY): Payer: Self-pay | Admitting: Pharmacy Technician

## 2020-06-22 NOTE — Telephone Encounter (Signed)
Advanced Heart Failure Patient Advocate Encounter  Prior Authorization for Corlanor 7.5mg  has been approved. This PA was submitted over the phone, covermymeds does not recognize the patient as being active. Called [phone] to complete the request over the phone.  PA# [id number] Effective dates: 05/23/20 through 06/22/21  Patients co-pay is $45.00  Looks like the patient had a previous co-pay card that was now inactive. Spoke with a representative and they were able to reactivate the $20 co-pay card.  Pharmacy Billing Information  BIN: [id number]  PCN: [id number]  Group: [id number]  ID: [id number]   Charlann Boxer, CPhT

## 2020-06-22 NOTE — Telephone Encounter (Signed)
Tried several times to give co-pay card information to the patient's pharmacy, I just kept getting put on hold. Called the patient and provided her the billing information. The company is also going to send her another card in the mail.  Charlann Boxer, CPhT

## 2020-07-05 ENCOUNTER — Ambulatory Visit (INDEPENDENT_AMBULATORY_CARE_PROVIDER_SITE_OTHER): Payer: BC Managed Care – PPO | Admitting: *Deleted

## 2020-07-05 DIAGNOSIS — I255 Ischemic cardiomyopathy: Secondary | ICD-10-CM

## 2020-07-05 LAB — CUP PACEART REMOTE DEVICE CHECK
Battery Remaining Longevity: 124 mo
Battery Voltage: 3.03 V
Brady Statistic RV Percent Paced: 0 %
Date Time Interrogation Session: 20210726033321
HighPow Impedance: 69 Ohm
Implantable Lead Implant Date: 20190905
Implantable Lead Location: 753860
Implantable Pulse Generator Implant Date: 20190905
Lead Channel Impedance Value: 323 Ohm
Lead Channel Impedance Value: 437 Ohm
Lead Channel Pacing Threshold Amplitude: 0.875 V
Lead Channel Pacing Threshold Pulse Width: 0.4 ms
Lead Channel Sensing Intrinsic Amplitude: 24.75 mV
Lead Channel Sensing Intrinsic Amplitude: 24.75 mV
Lead Channel Setting Pacing Amplitude: 2 V
Lead Channel Setting Pacing Pulse Width: 0.4 ms
Lead Channel Setting Sensing Sensitivity: 0.3 mV

## 2020-07-08 NOTE — Progress Notes (Signed)
Remote ICD transmission.   

## 2020-07-12 ENCOUNTER — Ambulatory Visit (INDEPENDENT_AMBULATORY_CARE_PROVIDER_SITE_OTHER): Payer: BC Managed Care – PPO

## 2020-07-12 DIAGNOSIS — Z95810 Presence of automatic (implantable) cardiac defibrillator: Secondary | ICD-10-CM

## 2020-07-12 DIAGNOSIS — I5042 Chronic combined systolic (congestive) and diastolic (congestive) heart failure: Secondary | ICD-10-CM | POA: Diagnosis not present

## 2020-07-14 NOTE — Progress Notes (Signed)
EPIC Encounter for ICM Monitoring  Patient Name: Brittany [name] is a 59 y.o. female Date: 07/14/2020 Primary Care Physican: Scifres, Durel Salts Primary Cardiologist:Bensimhon Electrophysiologist:Allred 7/2/2021Weight: 820UOR   Spoke with patient and reports feeling well at this time.  Denies fluid symptoms.    OptivolThoracic impedancenormal.  Prescribed:   Torsemide20 mgTake 3 tablets (60 mg total) by mouth daily. Take an extra 20 mg PRN for 3 lb weight gain in 24 hours  Potassium 20 mEq take 1 tablet daily  Spironolactone 25 mg take 1 tablet daily  Labs: 05/24/2020 Creatinine 1.01, BUN 26, Potassium 3.7, Sodium 137, GFR >60 05/23/2020 Creatinine 1.22, BUN 24, Potassium 3.7, Sodium 138, GFR 48-56  05/22/2020 Creatinine 1.04, BUN 17, Potassium 3.9, Sodium 135, GFR 59->60  05/21/2020 Creatinine 1.05, BUN 8,   Potassium 3.9, Sodium 140, GFR 58->60  05/20/2020 Creatinine 1.11, BUN 11, Potassium 3.2, Sodium 141, GFR 54->60  01/13/2020 Creatinine 1.19, BUN 20 Potassium 3.7, Sodium 143, GFR 50-58 A complete set of results can be found in Results Review.  Recommendations:No changes and encouraged to call if experiencing any fluid symptoms.  Follow-up plan: ICM clinic phone appointment on9/06/2020. 91 day device clinic remote transmission10/26/2021.  EP/Cardiology Office Visits: Recall for 10/09/2020 with Dr [name] and 10/10/2020 with Oda Kilts, PA  Copy of ICM check sent to Dr. Rayann Heman.   3 month ICM trend: 07/12/2020    1 Year ICM trend:       [name], RN 07/14/2020 4:07 PM

## 2020-08-17 ENCOUNTER — Ambulatory Visit (INDEPENDENT_AMBULATORY_CARE_PROVIDER_SITE_OTHER): Payer: BC Managed Care – PPO

## 2020-08-17 ENCOUNTER — Encounter (HOSPITAL_COMMUNITY): Payer: BC Managed Care – PPO

## 2020-08-17 DIAGNOSIS — Z95810 Presence of automatic (implantable) cardiac defibrillator: Secondary | ICD-10-CM | POA: Diagnosis not present

## 2020-08-17 DIAGNOSIS — I5042 Chronic combined systolic (congestive) and diastolic (congestive) heart failure: Secondary | ICD-10-CM | POA: Diagnosis not present

## 2020-08-19 ENCOUNTER — Other Ambulatory Visit (HOSPITAL_COMMUNITY): Payer: BC Managed Care – PPO

## 2020-08-20 NOTE — Progress Notes (Signed)
EPIC Encounter for ICM Monitoring  Patient Name: Brittany [name] is a 59 y.o. female Date: 08/20/2020 Primary Care Physican: Scifres, Durel Salts Primary Cardiologist:Bensimhon Electrophysiologist:Allred 9/10/2021Weight: 729MSX   Spoke with patient and reports feeling well at this time.  Denies fluid symptoms.  Patient had an aunt that passed away from [place] whom she was very close to.  She has another aunt just diagnosed with COVID.    OptivolThoracic impedancenormal.  Prescribed:   Torsemide20 mgTake 3 tablets (60 mg total) by mouth daily. Take an extra 20 mg PRN for 3 lb weight gain in 24 hours  Potassium 20 mEq take 1 tablet daily  Spironolactone 25 mg take 1 tablet daily  Labs: 05/24/2020 Creatinine1.01, BUN26, Potassium3.7, [id number], GFR>60 05/23/2020 Creatinine1.22, BUN24, Potassium3.7, [id number], [id number]  05/22/2020 Creatinine1.04, BUN17, Potassium3.9, Sodium135, GFR59->60  05/21/2020 Creatinine1.05, BUN8, Potassium 3.9, Sodium140, GFR58->60  05/20/2020 Creatinine1.11, BUN11, Potassium3.2, Sodium141, GFR54->60 01/13/2020 Creatinine 1.19, BUN 20 Potassium 3.7, Sodium 143, GFR 50-58 A complete set of results can be found in Results Review.  Recommendations:No changes and encouraged to call if experiencing any fluid symptoms.  Follow-up plan: ICM clinic phone appointment on10/10/2020. 91 day device clinic remote transmission10/26/2021.  EP/Cardiology Office Visits: Recall for 10/04/2020 with Dr [name]  Copy of ICM check sent to Dr. Rayann Heman.   3 month ICM trend: 08/17/2020    1 Year ICM trend:       [name], RN 08/20/2020 10:00 AM

## 2020-09-20 ENCOUNTER — Ambulatory Visit (INDEPENDENT_AMBULATORY_CARE_PROVIDER_SITE_OTHER): Payer: BC Managed Care – PPO

## 2020-09-20 DIAGNOSIS — Z95810 Presence of automatic (implantable) cardiac defibrillator: Secondary | ICD-10-CM

## 2020-09-20 DIAGNOSIS — I5042 Chronic combined systolic (congestive) and diastolic (congestive) heart failure: Secondary | ICD-10-CM | POA: Diagnosis not present

## 2020-09-21 NOTE — Progress Notes (Signed)
EPIC Encounter for ICM Monitoring  Patient Name: Brittany [name] is a 59 y.o. female Date: 09/21/2020 Primary Care Physican: Scifres, Durel Salts Primary Cardiologist:Bensimhon Electrophysiologist:Allred 9/10/2021Weight: 250IBB  Spoke with patient and reports feeling well at this time.  Denies fluid symptoms. She has a close aunt that died from [place] and another aunt that is battling the virus as well.  Her son's father is dying from cancer and her son is having difficulty with the situation.  She feels a lot of stress at this time.   OptivolThoracic impedancenormal.  Prescribed:   Torsemide20 mgTake 3 tablets (60 mg total) by mouth daily. Takean extra 20 mg PRN for 3 lb weight gain in 24 hours  Potassium 20 mEq take 1 tablet daily  Spironolactone 25 mg take 1 tablet daily  Labs: 05/24/2020 Creatinine1.01, BUN26, Potassium3.7, [id number], GFR>60 05/23/2020 Creatinine1.22, BUN24, Potassium3.7, [id number], [id number]  05/22/2020 Creatinine1.04, BUN17, Potassium3.9, Sodium135, GFR59->60  05/21/2020 Creatinine1.05, BUN8, Potassium 3.9, Sodium140, GFR58->60  05/20/2020 Creatinine1.11, BUN11, Potassium3.2, Sodium141, GFR54->60 01/13/2020 Creatinine 1.19, BUN 20 Potassium 3.7, Sodium 143, GFR 50-58 A complete set of results can be found in Results Review.  Recommendations:Advised self care is very important when dealing with stressful situations.  No changes and encouraged to call if experiencing any fluid symptoms.  Follow-up plan: ICM clinic phone appointment on11/16/2021. 91 day device clinic remote transmission10/26/2021.  EP/Cardiology Office Visits: 10/04/2020 with Dr [name]  Copy of ICM check sent to Dr.Allred.    3 month ICM trend: 09/20/2020    1 Year ICM trend:       [name], RN 09/21/2020 12:48 PM

## 2020-10-04 ENCOUNTER — Ambulatory Visit (HOSPITAL_COMMUNITY): Admission: RE | Admit: 2020-10-04 | Payer: BC Managed Care – PPO | Source: Ambulatory Visit

## 2020-10-04 ENCOUNTER — Encounter (HOSPITAL_COMMUNITY): Payer: BC Managed Care – PPO | Admitting: Internal Medicine

## 2020-10-04 ENCOUNTER — Ambulatory Visit (INDEPENDENT_AMBULATORY_CARE_PROVIDER_SITE_OTHER): Payer: BC Managed Care – PPO

## 2020-10-04 DIAGNOSIS — I480 Paroxysmal atrial fibrillation: Secondary | ICD-10-CM

## 2020-10-06 LAB — CUP PACEART REMOTE DEVICE CHECK
Battery Remaining Longevity: 121 mo
Battery Voltage: 3.03 V
Brady Statistic RV Percent Paced: 0 %
Date Time Interrogation Session: 20211025033423
HighPow Impedance: 89 Ohm
Implantable Lead Implant Date: 20190905
Implantable Lead Location: 753860
Implantable Pulse Generator Implant Date: 20190905
Lead Channel Impedance Value: 342 Ohm
Lead Channel Impedance Value: 456 Ohm
Lead Channel Pacing Threshold Amplitude: 0.875 V
Lead Channel Pacing Threshold Pulse Width: 0.4 ms
Lead Channel Sensing Intrinsic Amplitude: 28.5 mV
Lead Channel Sensing Intrinsic Amplitude: 28.5 mV
Lead Channel Setting Pacing Amplitude: 2 V
Lead Channel Setting Pacing Pulse Width: 0.4 ms
Lead Channel Setting Sensing Sensitivity: 0.3 mV

## 2020-10-07 NOTE — Progress Notes (Signed)
Remote ICD transmission.   

## 2020-10-20 ENCOUNTER — Emergency Department (HOSPITAL_COMMUNITY): Payer: BC Managed Care – PPO

## 2020-10-20 ENCOUNTER — Encounter (HOSPITAL_COMMUNITY): Payer: Self-pay | Admitting: Internal Medicine

## 2020-10-20 ENCOUNTER — Inpatient Hospital Stay (HOSPITAL_COMMUNITY)
Admission: EM | Admit: 2020-10-20 | Discharge: 2020-10-26 | DRG: 166 | Disposition: A | Discharge status: Home / Self Care | Payer: BC Managed Care – PPO | Attending: Internal Medicine | Admitting: Internal Medicine

## 2020-10-20 ENCOUNTER — Observation Stay (HOSPITAL_COMMUNITY): Payer: BC Managed Care – PPO

## 2020-10-20 DIAGNOSIS — Z885 Allergy status to narcotic agent status: Secondary | ICD-10-CM

## 2020-10-20 DIAGNOSIS — I480 Paroxysmal atrial fibrillation: Secondary | ICD-10-CM | POA: Diagnosis not present

## 2020-10-20 DIAGNOSIS — Z7982 Long term (current) use of aspirin: Secondary | ICD-10-CM

## 2020-10-20 DIAGNOSIS — I252 Old myocardial infarction: Secondary | ICD-10-CM

## 2020-10-20 DIAGNOSIS — Z87891 Personal history of nicotine dependence: Secondary | ICD-10-CM

## 2020-10-20 DIAGNOSIS — Z95810 Presence of automatic (implantable) cardiac defibrillator: Secondary | ICD-10-CM

## 2020-10-20 DIAGNOSIS — Z8249 Family history of ischemic heart disease and other diseases of the circulatory system: Secondary | ICD-10-CM

## 2020-10-20 DIAGNOSIS — E1169 Type 2 diabetes mellitus with other specified complication: Secondary | ICD-10-CM

## 2020-10-20 DIAGNOSIS — Z955 Presence of coronary angioplasty implant and graft: Secondary | ICD-10-CM

## 2020-10-20 DIAGNOSIS — N1831 Chronic kidney disease, stage 3a: Secondary | ICD-10-CM

## 2020-10-20 DIAGNOSIS — J441 Chronic obstructive pulmonary disease with (acute) exacerbation: Principal | ICD-10-CM | POA: Diagnosis present

## 2020-10-20 DIAGNOSIS — I10 Essential (primary) hypertension: Secondary | ICD-10-CM | POA: Diagnosis present

## 2020-10-20 DIAGNOSIS — R042 Hemoptysis: Secondary | ICD-10-CM | POA: Diagnosis not present

## 2020-10-20 DIAGNOSIS — I509 Heart failure, unspecified: Secondary | ICD-10-CM

## 2020-10-20 DIAGNOSIS — Z923 Personal history of irradiation: Secondary | ICD-10-CM

## 2020-10-20 DIAGNOSIS — Z79899 Other long term (current) drug therapy: Secondary | ICD-10-CM

## 2020-10-20 DIAGNOSIS — E876 Hypokalemia: Secondary | ICD-10-CM | POA: Diagnosis present

## 2020-10-20 DIAGNOSIS — I255 Ischemic cardiomyopathy: Secondary | ICD-10-CM | POA: Diagnosis present

## 2020-10-20 DIAGNOSIS — F419 Anxiety disorder, unspecified: Secondary | ICD-10-CM | POA: Diagnosis present

## 2020-10-20 DIAGNOSIS — E785 Hyperlipidemia, unspecified: Secondary | ICD-10-CM | POA: Diagnosis present

## 2020-10-20 DIAGNOSIS — K5900 Constipation, unspecified: Secondary | ICD-10-CM | POA: Diagnosis present

## 2020-10-20 DIAGNOSIS — I5023 Acute on chronic systolic (congestive) heart failure: Secondary | ICD-10-CM | POA: Diagnosis present

## 2020-10-20 DIAGNOSIS — I9589 Other hypotension: Secondary | ICD-10-CM | POA: Diagnosis present

## 2020-10-20 DIAGNOSIS — E1122 Type 2 diabetes mellitus with diabetic chronic kidney disease: Secondary | ICD-10-CM

## 2020-10-20 DIAGNOSIS — I4891 Unspecified atrial fibrillation: Secondary | ICD-10-CM | POA: Diagnosis present

## 2020-10-20 DIAGNOSIS — I2510 Atherosclerotic heart disease of native coronary artery without angina pectoris: Secondary | ICD-10-CM | POA: Diagnosis present

## 2020-10-20 DIAGNOSIS — Z7902 Long term (current) use of antithrombotics/antiplatelets: Secondary | ICD-10-CM

## 2020-10-20 DIAGNOSIS — Z7984 Long term (current) use of oral hypoglycemic drugs: Secondary | ICD-10-CM

## 2020-10-20 DIAGNOSIS — Z951 Presence of aortocoronary bypass graft: Secondary | ICD-10-CM

## 2020-10-20 DIAGNOSIS — C3490 Malignant neoplasm of unspecified part of unspecified bronchus or lung: Secondary | ICD-10-CM | POA: Diagnosis present

## 2020-10-20 DIAGNOSIS — I110 Hypertensive heart disease with heart failure: Secondary | ICD-10-CM | POA: Diagnosis present

## 2020-10-20 DIAGNOSIS — I5043 Acute on chronic combined systolic (congestive) and diastolic (congestive) heart failure: Secondary | ICD-10-CM | POA: Diagnosis present

## 2020-10-20 DIAGNOSIS — E119 Type 2 diabetes mellitus without complications: Secondary | ICD-10-CM

## 2020-10-20 DIAGNOSIS — Z9221 Personal history of antineoplastic chemotherapy: Secondary | ICD-10-CM

## 2020-10-20 DIAGNOSIS — N289 Disorder of kidney and ureter, unspecified: Secondary | ICD-10-CM | POA: Diagnosis not present

## 2020-10-20 DIAGNOSIS — J9611 Chronic respiratory failure with hypoxia: Secondary | ICD-10-CM | POA: Diagnosis present

## 2020-10-20 DIAGNOSIS — Z85118 Personal history of other malignant neoplasm of bronchus and lung: Secondary | ICD-10-CM

## 2020-10-20 DIAGNOSIS — Z20822 Contact with and (suspected) exposure to covid-19: Secondary | ICD-10-CM | POA: Diagnosis present

## 2020-10-20 HISTORY — DX: Type 2 diabetes mellitus without complications: E11.9

## 2020-10-20 LAB — I-STAT CHEM 8, ED
BUN: 15 mg/dL (ref 6–20)
Calcium, Ion: 1.14 mmol/L — ABNORMAL LOW (ref 1.15–1.40)
Chloride: 101 mmol/L (ref 98–111)
Creatinine, Ser: 0.9 mg/dL (ref 0.44–1.00)
Glucose, Bld: 125 mg/dL — ABNORMAL HIGH (ref 70–99)
HCT: 53 % — ABNORMAL HIGH (ref 36.0–46.0)
Hemoglobin: 18 g/dL — ABNORMAL HIGH (ref 12.0–15.0)
Potassium: 3.1 mmol/L — ABNORMAL LOW (ref 3.5–5.1)
Sodium: 142 mmol/L (ref 135–145)
TCO2: 28 mmol/L (ref 22–32)

## 2020-10-20 LAB — I-STAT VENOUS BLOOD GAS, ED
Acid-Base Excess: 6 mmol/L — ABNORMAL HIGH (ref 0.0–2.0)
Bicarbonate: 31.4 mmol/L — ABNORMAL HIGH (ref 20.0–28.0)
Calcium, Ion: 1.17 mmol/L (ref 1.15–1.40)
HCT: 52 % — ABNORMAL HIGH (ref 36.0–46.0)
Hemoglobin: 17.7 g/dL — ABNORMAL HIGH (ref 12.0–15.0)
O2 Saturation: 68 %
Potassium: 3.1 mmol/L — ABNORMAL LOW (ref 3.5–5.1)
Sodium: 144 mmol/L (ref 135–145)
TCO2: 33 mmol/L — ABNORMAL HIGH (ref 22–32)
pCO2, Ven: 45.1 mmHg (ref 44.0–60.0)
pH, Ven: 7.451 — ABNORMAL HIGH (ref 7.250–7.430)
pO2, Ven: 34 mmHg (ref 32.0–45.0)

## 2020-10-20 LAB — CBC WITH DIFFERENTIAL/PLATELET
Abs Immature Granulocytes: 0.03 10*3/uL (ref 0.00–0.07)
Basophils Absolute: 0.1 10*3/uL (ref 0.0–0.1)
Basophils Relative: 1 %
Eosinophils Absolute: 0.1 10*3/uL (ref 0.0–0.5)
Eosinophils Relative: 2 %
HCT: 50.2 % — ABNORMAL HIGH (ref 36.0–46.0)
Hemoglobin: 15.9 g/dL — ABNORMAL HIGH (ref 12.0–15.0)
Immature Granulocytes: 0 %
Lymphocytes Relative: 17 %
Lymphs Abs: 1.2 10*3/uL (ref 0.7–4.0)
MCH: 29.8 pg (ref 26.0–34.0)
MCHC: 31.7 g/dL (ref 30.0–36.0)
MCV: 94.2 fL (ref 80.0–100.0)
Monocytes Absolute: 0.6 10*3/uL (ref 0.1–1.0)
Monocytes Relative: 9 %
Neutro Abs: 5.3 10*3/uL (ref 1.7–7.7)
Neutrophils Relative %: 71 %
Platelets: 205 10*3/uL (ref 150–400)
RBC: 5.33 MIL/uL — ABNORMAL HIGH (ref 3.87–5.11)
RDW: 19.9 % — ABNORMAL HIGH (ref 11.5–15.5)
WBC: 7.3 10*3/uL (ref 4.0–10.5)
nRBC: 0 % (ref 0.0–0.2)

## 2020-10-20 LAB — COMPREHENSIVE METABOLIC PANEL
ALT: 15 U/L (ref 0–44)
AST: 23 U/L (ref 15–41)
Albumin: 4.2 g/dL (ref 3.5–5.0)
Alkaline Phosphatase: 90 U/L (ref 38–126)
Anion gap: 14 (ref 5–15)
BUN: 10 mg/dL (ref 6–20)
CO2: 27 mmol/L (ref 22–32)
Calcium: 9.8 mg/dL (ref 8.9–10.3)
Chloride: 100 mmol/L (ref 98–111)
Creatinine, Ser: 0.92 mg/dL (ref 0.44–1.00)
GFR, Estimated: >60 mL/min (ref >60)
Glucose, Bld: 130 mg/dL — ABNORMAL HIGH (ref 70–99)
Potassium: 3.2 mmol/L — ABNORMAL LOW (ref 3.5–5.1)
Sodium: 141 mmol/L (ref 135–145)
Total Bilirubin: 1.5 mg/dL — ABNORMAL HIGH (ref 0.3–1.2)
Total Protein: 7.6 g/dL (ref 6.5–8.1)

## 2020-10-20 LAB — TROPONIN I (HIGH SENSITIVITY)
Troponin I (High Sensitivity): 21 ng/L — ABNORMAL HIGH (ref <18)
Troponin I (High Sensitivity): 21 ng/L — ABNORMAL HIGH (ref <18)

## 2020-10-20 LAB — RESPIRATORY PANEL BY RT PCR (FLU A&B, COVID)
Influenza A by PCR: NEGATIVE
Influenza B by PCR: NEGATIVE
SARS Coronavirus 2 by RT PCR: NEGATIVE

## 2020-10-20 LAB — BRAIN NATRIURETIC PEPTIDE: B Natriuretic Peptide: 1118.4 pg/mL — ABNORMAL HIGH (ref 0.0–100.0)

## 2020-10-20 MED ORDER — FUROSEMIDE 10 MG/ML IJ SOLN
40.0000 mg | Freq: Once | INTRAMUSCULAR | Status: AC
  Administered 2020-10-20: 40 mg via INTRAVENOUS
  Filled 2020-10-20: qty 4

## 2020-10-20 MED ORDER — ALPRAZOLAM 0.25 MG PO TABS: 0.2500 mg | ORAL_TABLET | Freq: Every day | ORAL | Status: DC | PRN

## 2020-10-20 MED ORDER — ONDANSETRON HCL 4 MG/2ML IJ SOLN
4.0000 mg | Freq: Four times a day (QID) | INTRAMUSCULAR | Status: DC | PRN
  Administered 2020-10-24: 4 mg via INTRAVENOUS
  Filled 2020-10-20 (×2): qty 2

## 2020-10-20 MED ORDER — ALBUTEROL SULFATE HFA 108 (90 BASE) MCG/ACT IN AERS: 2.0000 | INHALATION_SPRAY | Freq: Four times a day (QID) | RESPIRATORY_TRACT | Status: DC | PRN

## 2020-10-20 MED ORDER — ACETAMINOPHEN 325 MG PO TABS
650.0000 mg | ORAL_TABLET | ORAL | Status: DC | PRN
  Administered 2020-10-23: 650 mg via ORAL
  Filled 2020-10-20: qty 2

## 2020-10-20 MED ORDER — IPRATROPIUM-ALBUTEROL 0.5-2.5 (3) MG/3ML IN SOLN
3.0000 mL | Freq: Two times a day (BID) | RESPIRATORY_TRACT | Status: DC | PRN
  Administered 2020-10-21: 3 mL via RESPIRATORY_TRACT
  Filled 2020-10-20: qty 3

## 2020-10-20 MED ORDER — IVABRADINE HCL 7.5 MG PO TABS
11.2500 mg | ORAL_TABLET | Freq: Two times a day (BID) | ORAL | Status: DC
  Administered 2020-10-21 (×2): 11.25 mg via ORAL
  Filled 2020-10-20 (×3): qty 2

## 2020-10-20 MED ORDER — IOHEXOL 350 MG/ML SOLN
100.0000 mL | Freq: Once | INTRAVENOUS | Status: AC | PRN
  Administered 2020-10-20: 48 mL via INTRAVENOUS

## 2020-10-20 MED ORDER — METHYLPREDNISOLONE SODIUM SUCC 125 MG IJ SOLR
125.0000 mg | Freq: Once | INTRAMUSCULAR | Status: AC
  Administered 2020-10-20: 125 mg via INTRAVENOUS
  Filled 2020-10-20: qty 2

## 2020-10-20 MED ORDER — SODIUM CHLORIDE 0.9% FLUSH
3.0000 mL | Freq: Two times a day (BID) | INTRAVENOUS | Status: DC
  Administered 2020-10-21 – 2020-10-26 (×11): 3 mL via INTRAVENOUS

## 2020-10-20 MED ORDER — POTASSIUM CHLORIDE CRYS ER 20 MEQ PO TBCR
40.0000 meq | EXTENDED_RELEASE_TABLET | Freq: Once | ORAL | Status: AC
  Administered 2020-10-20: 40 meq via ORAL
  Filled 2020-10-20: qty 2

## 2020-10-20 MED ORDER — SPIRONOLACTONE 25 MG PO TABS
25.0000 mg | ORAL_TABLET | Freq: Every day | ORAL | Status: DC
  Administered 2020-10-21 – 2020-10-26 (×6): 25 mg via ORAL
  Filled 2020-10-20 (×7): qty 1

## 2020-10-20 MED ORDER — SODIUM CHLORIDE 0.9 % IV SOLN: 250.0000 mL | INTRAVENOUS | Status: DC | PRN

## 2020-10-20 MED ORDER — TORSEMIDE 20 MG PO TABS
60.0000 mg | ORAL_TABLET | Freq: Every day | ORAL | Status: DC
  Administered 2020-10-21: 60 mg via ORAL
  Filled 2020-10-20: qty 3

## 2020-10-20 MED ORDER — POTASSIUM CHLORIDE CRYS ER 20 MEQ PO TBCR
20.0000 meq | EXTENDED_RELEASE_TABLET | Freq: Every day | ORAL | Status: DC
  Administered 2020-10-21 – 2020-10-22 (×2): 20 meq via ORAL
  Filled 2020-10-20 (×2): qty 1

## 2020-10-20 MED ORDER — SODIUM CHLORIDE 0.9% FLUSH: 3.0000 mL | INTRAVENOUS | Status: DC | PRN

## 2020-10-20 MED ORDER — ALBUTEROL SULFATE HFA 108 (90 BASE) MCG/ACT IN AERS
2.0000 | INHALATION_SPRAY | Freq: Once | RESPIRATORY_TRACT | Status: AC
  Administered 2020-10-20: 2 via RESPIRATORY_TRACT
  Filled 2020-10-20: qty 6.7

## 2020-10-20 MED ORDER — DAPAGLIFLOZIN PROPANEDIOL 10 MG PO TABS: 10.0000 mg | ORAL_TABLET | Freq: Every day | ORAL | Status: DC

## 2020-10-20 MED ORDER — ATORVASTATIN CALCIUM 80 MG PO TABS
80.0000 mg | ORAL_TABLET | Freq: Every day | ORAL | Status: DC
  Administered 2020-10-21 – 2020-10-25 (×5): 80 mg via ORAL
  Filled 2020-10-20 (×5): qty 1

## 2020-10-20 MED ORDER — POTASSIUM CHLORIDE 10 MEQ/100ML IV SOLN
10.0000 meq | INTRAVENOUS | Status: AC
  Administered 2020-10-20 – 2020-10-21 (×2): 10 meq via INTRAVENOUS
  Filled 2020-10-20 (×2): qty 100

## 2020-10-20 NOTE — ED Provider Notes (Signed)
[facility]ERG[facility]Y DEPARTMENT Provider Note   CSN: [id number] Arrival date & time: 10/20/20  1644     History Chief Complaint  Patient presents with  . Shortness of Breath    Brittany [name] is a 59 y.o. female hx of afib, CABG, small cell lung cancer, heart failure, here presenting with shortness of breath and leg swelling.  Patient states that last 2 days or so, she has been having sudden onset of shortness of breath.  Patient states that she has shortness of breath when she lays down.  Patient also noted 2 to 3 pound weight gain since yesterday.  Patient also been coughing up some bright red blood as well.  Patient is on Brilinta.  Patient does have CABG in the past.  Patient states that she had to wear her oxygen all the time and still just at nighttime.  The history is provided by the patient.       Past Medical History:  Diagnosis Date  . Acute systolic congestive heart failure ([place]) 02/03/2018  . Allergy   . Anxiety   . Asthma   . Hypertension   . PAF (paroxysmal atrial fibrillation) ([place])   . Prophylactic measure 08/03/14-08/19/14   Prophyl. cranial radiation 24 Gy  . 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 Small cell lung cancer ([place]) 03/16/2014    Patient Active Problem List   Diagnosis Date Noted  . Acute on chronic systolic and diastolic heart failure, NYHA class 3 ([place]) 05/21/2020  . Ischemic cardiomyopathy 04/12/2020  . 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 NSTEMI (non-ST elevated myocardial infarction) ([place]) 02/12/2019  . CHF (congestive heart failure) ([place]) 11/24/2018  . Recurrent pleural effusion on right 05/05/2018  . Acute respiratory failure ([place]) 05/05/2018  . Dyspnea 04/19/2018  . Chronic respiratory failure with hypoxia ([place])   . Acute on chronic systolic CHF (congestive heart  failure) ([place]) 02/03/2018  . S/P CABG (coronary artery bypass graft) 02/03/2018  . Asthma   . Anxiety   . Allergy   . HCAP (healthcare-associated pneumonia) 03/15/2015  . Chest pain 03/15/2015  . Small cell lung cancer ([place]) 03/16/2014  . Protein-calorie malnutrition, severe ([place]) 03/14/2014  . Atrial fibrillation ([place]) 03/12/2014  . HTN (hypertension) 05/07/2012    Past Surgical History:  Procedure Laterality Date  . CARDIAC DEFIBRILLATOR PLACEMENT  08/15/2018   MDT Visia AF MRI VR ICD implanted by Dr Loel Lofty for primary prevention of sudden  . CESAREAN SECTION    . CORONARY ARTERY BYPASS GRAFT N/A 02/03/2018   Procedure: CORONARY ARTERY BYPASS GRAFTING (CABG);  Surgeon: Rexene Alberts, MD;  Location: [facility];  Service: Open Heart Surgery;  Laterality: N/A;  Time 3 using left internal mammary artery and endoscopically harvested right saphenous vein  . CORONARY BALLOON ANGIOPLASTY N/A 02/03/2018   Procedure: CORONARY BALLOON ANGIOPLASTY;  Surgeon: Martinique, Peter M, MD;  Location: [facility];  Service: Cardiovascular;  Laterality: N/A;  . CORONARY STENT INTERVENTION N/A 02/12/2019   Procedure: CORONARY STENT INTERVENTION;  Surgeon: Wellington Hampshire, MD;  Location: [facility];  Service: Cardiovascular;  Laterality: N/A;  . CORONARY/GRAFT ACUTE MI REVASCULARIZATION N/A 02/03/2018   Procedure: Coronary/Graft Acute MI Revascularization;  Surgeon: Martinique, Peter M, MD;  Location: [facility];  Service: Cardiovascular;  Laterality: N/A;  . IABP INSERTION  N/A 02/03/2018   Procedure: IABP Insertion;  Surgeon: Martinique, Peter M, MD;  Location: [facility];  Service: Cardiovascular;  Laterality: N/A;  . INTRAOPERATIVE TRANSESOPHAGEAL ECHOCARDIOGRAM N/A 02/03/2018   Procedure: INTRAOPERATIVE TRANSESOPHAGEAL ECHOCARDIOGRAM;  Surgeon: Rexene Alberts, MD;  Location: [facility];  Service: Open Heart Surgery;  Laterality: N/A;  . LEFT HEART CATH AND CORONARY ANGIOGRAPHY N/A 02/03/2018    Procedure: LEFT HEART CATH AND CORONARY ANGIOGRAPHY;  Surgeon: Martinique, Peter M, MD;  Location: [facility];  Service: Cardiovascular;  Laterality: N/A;  . LEFT HEART CATH AND CORS/GRAFTS ANGIOGRAPHY N/A 02/12/2019   Procedure: LEFT HEART CATH AND CORS/GRAFTS ANGIOGRAPHY;  Surgeon: Wellington Hampshire, MD;  Location: [facility];  Service: Cardiovascular;  Laterality: N/A;  . MEDIASTINOSCOPY N/A 03/11/2014   Procedure: MEDIASTINOSCOPY;  Surgeon: Melrose Nakayama, MD;  Location: [facility];  Service: Thoracic;  Laterality: N/A;  . PLACEMENT OF [place] LEFT VENTRICULAR ASSIST DEVICE  02/03/2018   Procedure: PLACEMENT OF [place] LEFT VENTRICULAR ASSIST DEVICE LD;  Surgeon: Rexene Alberts, MD;  Location: [facility];  Service: Open Heart Surgery;;  . REMOVAL OF IMPELLA LEFT VENTRICULAR ASSIST DEVICE N/A 02/08/2018   Procedure: REMOVAL OF [place] LEFT VENTRICULAR ASSIST DEVICE;  Surgeon: Rexene Alberts, MD;  Location: [facility];  Service: Open Heart Surgery;  Laterality: N/A;  . RIGHT HEART CATH N/A 02/03/2018   Procedure: RIGHT HEART CATH;  Surgeon: Martinique, Peter M, MD;  Location: [name];  Service: Cardiovascular;  Laterality: N/A;  . RIGHT HEART CATH N/A 05/09/2018   Procedure: RIGHT HEART CATH;  Surgeon: Jolaine Artist, MD;  Location: [facility];  Service: Cardiovascular;  Laterality: N/A;  . TEE WITHOUT CARDIOVERSION N/A 02/08/2018   Procedure: TRANSESOPHAGEAL ECHOCARDIOGRAM (TEE);  Surgeon: Rexene Alberts, MD;  Location: [facility];  Service: Open Heart Surgery;  Laterality: N/A;  . TUBAL LIGATION    . VIDEO BRONCHOSCOPY WITH ENDOBRONCHIAL ULTRASOUND N/A 03/11/2014   Procedure: VIDEO BRONCHOSCOPY WITH ENDOBRONCHIAL ULTRASOUND;  Surgeon: Melrose Nakayama, MD;  Location: [facility];  Service: Thoracic;  Laterality: N/A;     OB History   No obstetric history on file.     Family History  Problem Relation Age of Onset  . Heart disease Mother   . Hypertension Mother   . Heart  attack Mother   . Hypertension Maternal Grandmother   . Cancer Maternal Grandmother   . Diabetes Paternal Grandmother   . Stroke Neg Hx     Social History   Tobacco Use  . Smoking status: Former Smoker    Packs/day: 1.00    Years: 20.00    Pack years: 20.00    Types: Cigarettes    Quit date: 03/13/2014    Years since quitting: 6.6  . Smokeless tobacco: Never Used  Vaping Use  . Vaping Use: Never used  Substance Use Topics  . Alcohol use: Yes    Alcohol/week: 8.0 standard drinks    Types: 8 Glasses of wine per week  . Drug use: Yes    Types: Marijuana    Comment: "medicinal"    Home Medications Prior to Admission medications   Medication Sig Start Date End Date Taking? Authorizing Provider  albuterol (VENTOLIN HFA) 108 (90 Base) MCG/ACT inhaler Inhale 2 puffs into the lungs every 6 (six) hours as needed for wheezing or shortness of breath. 04/16/19   Bensimhon, Shaune Pascal, MD  ALPRAZolam Duanne Moron) 0.25 MG tablet Take 1 tablet (0.25  mg total) by mouth daily as needed for anxiety. 02/24/19   Shirley Friar, PA-C  aspirin 81 MG EC tablet Take 1 tablet (81 mg total) by mouth daily. 07/03/18   Bensimhon, Shaune Pascal, MD  atorvastatin (LIPITOR) 80 MG tablet Take 1 tablet (80 mg total) by mouth daily at 6 PM. 07/03/18   Bensimhon, Shaune Pascal, MD  dapagliflozin propanediol (FARXIGA) 10 MG TABS tablet Take 1 tablet (10 mg total) by mouth daily before breakfast. 06/11/20   Bensimhon, Shaune Pascal, MD  doxycycline (VIBRA-TABS) 100 MG tablet Take 1 tablet (100 mg total) by mouth every 12 (twelve) hours. 05/24/20   Clegg, Amy D, NP  ipratropium-albuterol (DUONEB) 0.5-2.5 (3) MG/3ML SOLN Take 3 mLs by nebulization 2 (two) times daily as needed.    [provider]  ivabradine (CORLANOR) 7.5 MG TABS tablet Take 1 tablet (7.5 mg total) by mouth 2 (two) times daily with a meal. 02/19/20   Larey Dresser, MD  nitroGLYCERIN (NITROSTAT) 0.4 MG SL tablet Place 1 tablet (0.4 mg total) under the tongue  every 5 (five) minutes as needed for chest pain. 02/14/19   Shirley Friar, PA-C  potassium chloride SA (K-DUR,KLOR-CON) 20 MEQ tablet Take 1 tablet (20 mEq total) by mouth daily. 02/26/19   Shirley Friar, PA-C  spironolactone (ALDACTONE) 25 MG tablet Take 1 tablet (25 mg total) by mouth daily. 04/12/20   Allred, Jeneen Rinks, MD  ticagrelor (BRILINTA) 90 MG TABS tablet Take 1 tablet (90 mg total) by mouth 2 (two) times daily. 05/17/20   Lyda Jester M, PA-C  torsemide (DEMADEX) 20 MG tablet Take 60 mg daily. May take an extra 20 mg PRN for 3lb weightt gain in 24 hrs. 06/18/20   Bensimhon, Shaune Pascal, MD    Allergies    Codeine  Review of Systems   Review of Systems  Respiratory: Positive for shortness of breath.   All other systems reviewed and are negative.   Physical Exam Updated Vital Signs BP 110/63   Pulse 72   Temp 98 F (36.7 C) (Oral)   Resp (!) 21   Ht 4\' 11"  (1.499 m)   Wt 47.6 kg   SpO2 100%   BMI 21.21 kg/m   Physical Exam Vitals and nursing note reviewed.  Constitutional:      Comments: Chronically ill, tachypneic   HENT:     Head: Normocephalic.     Mouth/Throat:     Mouth: Mucous membranes are moist.  Eyes:     Extraocular Movements: Extraocular movements intact.     Pupils: Pupils are equal, round, and reactive to light.  Cardiovascular:     Rate and Rhythm: Normal rate and regular rhythm.  Pulmonary:     Comments: Rhonchi throughout and crackles in bilateral bases. Abdominal:     General: Bowel sounds are normal.     Palpations: Abdomen is soft.  Musculoskeletal:     Cervical back: Normal range of motion and neck supple.     Comments: Trace edema bilaterally   Skin:    General: Skin is warm.     Capillary Refill: Capillary refill takes less than 2 seconds.  Neurological:     General: No focal deficit present.     Mental Status: She is oriented to person, place, and time.  Psychiatric:        Mood and Affect: Mood normal.         Behavior: Behavior normal.     ED Results / Procedures /  Treatments   Labs (all labs ordered are listed, but only abnormal results are displayed) Labs Reviewed  CBC WITH DIFFERENTIAL/PLATELET - Abnormal; Notable for the following components:      Result Value   RBC 5.33 (*)    Hemoglobin 15.9 (*)    HCT 50.2 (*)    RDW 19.9 (*)    All other components within normal limits  I-STAT CHEM 8, ED - Abnormal; Notable for the following components:   Potassium 3.1 (*)    Glucose, Bld 125 (*)    Calcium, Ion 1.14 (*)    Hemoglobin 18.0 (*)    HCT 53.0 (*)    All other components within normal limits  I-STAT VENOUS BLOOD GAS, ED - Abnormal; Notable for the following components:   pH, Ven 7.451 (*)    Bicarbonate 31.4 (*)    TCO2 33 (*)    Acid-Base Excess 6.0 (*)    Potassium 3.1 (*)    HCT 52.0 (*)    Hemoglobin 17.7 (*)    All other components within normal limits  RESPIRATORY PANEL BY RT PCR (FLU A&B, COVID)  COMPREHENSIVE METABOLIC PANEL  BRAIN NATRIURETIC PEPTIDE  BLOOD GAS, VENOUS  TROPONIN I (HIGH SENSITIVITY)    EKG EKG Interpretation  Date/Time:  Wednesday October 20 2020 17:18:16 EST Ventricular Rate:  67 PR Interval:    QRS Duration: 117 QT Interval:  503 QTC Calculation: 532 R Axis:   -91 Text Interpretation: Sinus rhythm Biatrial enlargement Nonspecific IVCD with LAD Consider left ventricular hypertrophy Inferior infarct, acute (LCx) Anterior ST elevation, probably due to LVH No significant change since last tracing Confirmed by Wandra Arthurs [phone]) on 10/20/2020 5:40:08 PM   Radiology DG Chest Port 1 View  Result Date: 10/20/2020 CLINICAL DATA:  Shortness of breath EXAM: PORTABLE CHEST 1 VIEW COMPARISON:  May 21, 2020 FINDINGS: The heart size and mediastinal contours are mildly enlarged. Overlying median sternotomy wires are present. A left-sided pacemaker seen with the lead tips in the right ventricle. Again noted is right perihilar scarring and  interstitial markings. The visualized skeletal structures are unremarkable. IMPRESSION: No active disease.  Stable mild cardiomegaly. Electronically Signed   By: Prudencio Pair M.D.   On: 10/20/2020 17:36    Procedures Procedures (including critical care time)  Medications Ordered in ED Medications  albuterol (VENTOLIN HFA) 108 (90 Base) MCG/ACT inhaler 2 puff (has no administration in time range)  methylPREDNISolone sodium succinate (SOLU-MEDROL) 125 mg/2 mL injection 125 mg (has no administration in time range)  furosemide (LASIX) injection 40 mg (has no administration in time range)    ED Course  I have reviewed the triage vital signs and the nursing notes.  Pertinent labs & imaging results that were available during my care of the patient were reviewed by me and considered in my medical decision making (see chart for details).    MDM Rules/Calculators/A&P                          Alnisa Hasley is a 59 y.o. female here with SOB.  Likely CHF versus COPD versus Covid.  Patient did not receive the Covid vaccine.  Plan to get CBC, CMP, troponin and BNP and chest x-ray.  We will also get Covid test as well.  Will give Lasix and Solu-Medrol and albuterol. Will likely need admission   7 pm Patient's BNP is 1100. Still tachypneic. VBG reassuring. Wheezing improved now. Talked to Dr. Neville Route from  cardiology, who recommend medicine admission and cardiology consult if not improving with lasix. Hospitalist to admit. COVID negative.   Final Clinical Impression(s) / ED Diagnoses Final diagnoses:  None    Rx / DC Orders ED Discharge Orders    None       Drenda Freeze, MD 10/20/20 1939

## 2020-10-20 NOTE — Progress Notes (Signed)
Patient came in on CPAP via EMS. BBS rhonchi. Stateing she is coughing up large amounts bloody secretions. SAT 100% on 2LNC and only slight SOB noted. Gave patient suction in case she needed to spit. RT to monitor as needed

## 2020-10-20 NOTE — H&P (Signed)
History and Physical    Brittany [name] XHB:[id number] DOB: [birth year] DOA: 10/20/2020  PCP: Maude Leriche, PA-C  Patient coming from: Home  I have personally briefly reviewed patient's old medical records in [place]  Chief Complaint: Hemoptysis  HPI: Brittany [name] is a 59 y.o. female with medical history significant of HFrEF EF 25%, ICM, CABG, DM2, HTN, SCLC in 2015 s/p radiation and chemo currently in remission.  On O2 at nighttime.  Pt presents to ED with c/o SOB and hemoptysis.  Onset suddenly ~2 days ago, persistent.  Worse when laying down.  2-3 lb wt gain since yesterday.  Coughing up bright red blood.  No CP, only mild BLE edema.   ED Course: No SIRS, BNP 1118, HGB 15.9.  Creat nl.  K 3.2.  Satting 100% on 2L via [place].  CXR shows no acute dz.  Given 40mg  IV lasix.   Review of Systems: As per HPI, otherwise all review of systems negative.  Past Medical History:  Diagnosis Date  . Acute systolic congestive heart failure ([place]) 02/03/2018  . Allergy   . Anxiety   . Asthma   . DM2 (diabetes mellitus, type 2) ([place]) 10/20/2020  . Hypertension   . PAF (paroxysmal atrial fibrillation) ([place])   . Prophylactic measure 08/03/14-08/19/14   Prophyl. cranial radiation 24 Gy  . 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 Small cell lung cancer ([place]) 03/16/2014    Past Surgical History:  Procedure Laterality Date  . CARDIAC DEFIBRILLATOR PLACEMENT  08/15/2018   MDT Visia AF MRI VR ICD implanted by Dr Loel Lofty for primary prevention of sudden  . CESAREAN SECTION    . CORONARY ARTERY BYPASS GRAFT N/A 02/03/2018   Procedure: CORONARY ARTERY BYPASS GRAFTING (CABG);  Surgeon: Rexene Alberts, MD;  Location: [facility];  Service: Open Heart Surgery;  Laterality: N/A;  Time 3 using left internal mammary artery and endoscopically harvested right  saphenous vein  . CORONARY BALLOON ANGIOPLASTY N/A 02/03/2018   Procedure: CORONARY BALLOON ANGIOPLASTY;  Surgeon: Martinique, Peter M, MD;  Location: [facility];  Service: Cardiovascular;  Laterality: N/A;  . CORONARY STENT INTERVENTION N/A 02/12/2019   Procedure: CORONARY STENT INTERVENTION;  Surgeon: Wellington Hampshire, MD;  Location: [facility];  Service: Cardiovascular;  Laterality: N/A;  . CORONARY/GRAFT ACUTE MI REVASCULARIZATION N/A 02/03/2018   Procedure: Coronary/Graft Acute MI Revascularization;  Surgeon: Martinique, Peter M, MD;  Location: [facility];  Service: Cardiovascular;  Laterality: N/A;  . IABP INSERTION N/A 02/03/2018   Procedure: IABP Insertion;  Surgeon: Martinique, Peter M, MD;  Location: [facility];  Service: Cardiovascular;  Laterality: N/A;  . INTRAOPERATIVE TRANSESOPHAGEAL ECHOCARDIOGRAM N/A 02/03/2018   Procedure: INTRAOPERATIVE TRANSESOPHAGEAL ECHOCARDIOGRAM;  Surgeon: Rexene Alberts, MD;  Location: [facility];  Service: Open Heart Surgery;  Laterality: N/A;  . LEFT HEART CATH AND CORONARY ANGIOGRAPHY N/A 02/03/2018   Procedure: LEFT HEART CATH AND CORONARY ANGIOGRAPHY;  Surgeon: Martinique, Peter M, MD;  Location: [facility];  Service: Cardiovascular;  Laterality: N/A;  . LEFT HEART CATH AND CORS/GRAFTS ANGIOGRAPHY N/A 02/12/2019   Procedure: LEFT HEART CATH AND CORS/GRAFTS ANGIOGRAPHY;  Surgeon: Wellington Hampshire, MD;  Location: [facility];  Service: Cardiovascular;  Laterality: N/A;  . MEDIASTINOSCOPY N/A 03/11/2014   Procedure: MEDIASTINOSCOPY;  Surgeon: Revonda Standard  Roxan Hockey, MD;  Location: [facility];  Service: Thoracic;  Laterality: N/A;  . PLACEMENT OF IMPELLA LEFT VENTRICULAR ASSIST DEVICE  02/03/2018   Procedure: PLACEMENT OF [place] LEFT VENTRICULAR ASSIST DEVICE LD;  Surgeon: Rexene Alberts, MD;  Location: [facility];  Service: Open Heart Surgery;;  . REMOVAL OF IMPELLA LEFT VENTRICULAR ASSIST DEVICE N/A 02/08/2018   Procedure: REMOVAL OF [place]  LEFT VENTRICULAR ASSIST DEVICE;  Surgeon: Rexene Alberts, MD;  Location: [facility];  Service: Open Heart Surgery;  Laterality: N/A;  . RIGHT HEART CATH N/A 02/03/2018   Procedure: RIGHT HEART CATH;  Surgeon: Martinique, Peter M, MD;  Location: [facility];  Service: Cardiovascular;  Laterality: N/A;  . RIGHT HEART CATH N/A 05/09/2018   Procedure: RIGHT HEART CATH;  Surgeon: Jolaine Artist, MD;  Location: [facility];  Service: Cardiovascular;  Laterality: N/A;  . TEE WITHOUT CARDIOVERSION N/A 02/08/2018   Procedure: TRANSESOPHAGEAL ECHOCARDIOGRAM (TEE);  Surgeon: Rexene Alberts, MD;  Location: [facility];  Service: Open Heart Surgery;  Laterality: N/A;  . TUBAL LIGATION    . VIDEO BRONCHOSCOPY WITH ENDOBRONCHIAL ULTRASOUND N/A 03/11/2014   Procedure: VIDEO BRONCHOSCOPY WITH ENDOBRONCHIAL ULTRASOUND;  Surgeon: Melrose Nakayama, MD;  Location: [facility];  Service: Thoracic;  Laterality: N/A;     reports that she quit smoking about 6 years ago. Her smoking use included cigarettes. She has a 20.00 pack-year smoking history. She has never used smokeless tobacco. She reports current alcohol use of about 8.0 standard drinks of alcohol per week. She reports current drug use. Drug: Marijuana.  Allergies  Allergen Reactions  . Codeine Nausea And Vomiting    Family History  Problem Relation Age of Onset  . Heart disease Mother   . Hypertension Mother   . Heart attack Mother   . Hypertension Maternal Grandmother   . Cancer Maternal Grandmother   . Diabetes Paternal Grandmother   . Stroke Neg Hx      Prior to Admission medications   Medication Sig Start Date End Date Taking? Authorizing Provider  albuterol (VENTOLIN HFA) 108 (90 Base) MCG/ACT inhaler Inhale 2 puffs into the lungs every 6 (six) hours as needed for wheezing or shortness of breath. 04/16/19  Yes Bensimhon, Shaune Pascal, MD  ALPRAZolam Duanne Moron) 0.25 MG tablet Take 1 tablet (0.25 mg total) by mouth daily as needed for anxiety. 02/24/19   Yes Shirley Friar, PA-C  aspirin 81 MG EC tablet Take 1 tablet (81 mg total) by mouth daily. 07/03/18  Yes Bensimhon, Shaune Pascal, MD  atorvastatin (LIPITOR) 80 MG tablet Take 1 tablet (80 mg total) by mouth daily at 6 PM. 07/03/18  Yes Bensimhon, Shaune Pascal, MD  dapagliflozin propanediol (FARXIGA) 10 MG TABS tablet Take 1 tablet (10 mg total) by mouth daily before breakfast. 06/11/20  Yes Bensimhon, Shaune Pascal, MD  ipratropium-albuterol (DUONEB) 0.5-2.5 (3) MG/3ML SOLN Take 3 mLs by nebulization 2 (two) times daily as needed.   Yes [provider]  ivabradine (CORLANOR) 7.5 MG TABS tablet Take 1 tablet (7.5 mg total) by mouth 2 (two) times daily with a meal. Patient taking differently: Take 11.25 mg by mouth 2 (two) times daily with a meal.  02/19/20  Yes Larey Dresser, MD  nitroGLYCERIN (NITROSTAT) 0.4 MG SL tablet Place 1 tablet (0.4 mg total) under the tongue every 5 (five) minutes as needed for chest pain. 02/14/19  Yes Shirley Friar, PA-C  potassium chloride SA (K-DUR,KLOR-CON) 20 MEQ tablet Take 1  tablet (20 mEq total) by mouth daily. 02/26/19  Yes Shirley Friar, PA-C  spironolactone (ALDACTONE) 25 MG tablet Take 1 tablet (25 mg total) by mouth daily. 04/12/20  Yes Allred, Jeneen Rinks, MD  ticagrelor (BRILINTA) 90 MG TABS tablet Take 1 tablet (90 mg total) by mouth 2 (two) times daily. 05/17/20  Yes Simmons, Brittainy M, PA-C  torsemide (DEMADEX) 20 MG tablet Take 60 mg daily. May take an extra 20 mg PRN for 3lb weightt gain in 24 hrs. 06/18/20  Yes Bensimhon, Shaune Pascal, MD    Physical Exam: Vitals:   10/20/20 1650 10/20/20 1815 10/20/20 1830 10/20/20 1930  BP: 110/63 117/76 118/67 117/73  Pulse: 72 72 70 73  Resp: (!) 21 18 (!) 21 (!) 22  Temp: 98 F (36.7 C)     TempSrc: Oral     SpO2: 100% 100% 100% 100%  Weight:      Height:        Constitutional: NAD, calm, comfortable Eyes: PERRL, lids and conjunctivae normal ENMT: Mucous membranes are moist. Posterior pharynx  clear of any exudate or lesions.Normal dentition.  Neck: normal, supple, no masses, no thyromegaly Respiratory: clear to auscultation bilaterally, no wheezing, no crackles. Normal respiratory effort. No accessory muscle use.  Cardiovascular: Regular rate and rhythm, no murmurs / rubs / gallops. Trace extremity edema. 2+ pedal pulses. No carotid bruits.  Abdomen: no tenderness, no masses palpated. No hepatosplenomegaly. Bowel sounds positive.  Musculoskeletal: no clubbing / cyanosis. No joint deformity upper and lower extremities. Good ROM, no contractures. Normal muscle tone.  Skin: no rashes, lesions, ulcers. No induration Neurologic: CN 2-12 grossly intact. Sensation intact, DTR normal. Strength 5/5 in all 4.  Psychiatric: Normal judgment and insight. Alert and oriented x 3. Normal mood.    Labs on Admission: I have personally reviewed following labs and imaging studies  CBC: Recent Labs  Lab 10/20/20 1702 10/20/20 1714  WBC 7.3  --   NEUTROABS 5.3  --   HGB 15.9* 17.7*  18.0*  HCT 50.2* 52.0*  53.0*  MCV 94.2  --   PLT 205  --    Basic Metabolic Panel: Recent Labs  Lab 10/20/20 1702 10/20/20 1714  NA 141 144  142  K 3.2* 3.1*  3.1*  CL 100 101  CO2 27  --   GLUCOSE 130* 125*  BUN 10 15  CREATININE 0.92 0.90  CALCIUM 9.8  --    GFR: Estimated Creatinine Clearance: 45.3 mL/min (by C-G formula based on SCr of 0.9 mg/dL). Liver Function Tests: Recent Labs  Lab 10/20/20 1702  AST 23  ALT 15  ALKPHOS 90  BILITOT 1.5*  PROT 7.6  ALBUMIN 4.2   No results for input(s): LIPASE, AMYLASE in the last 168 hours. No results for input(s): AMMONIA in the last 168 hours. Coagulation Profile: No results for input(s): INR, PROTIME in the last 168 hours. Cardiac Enzymes: No results for input(s): CKTOTAL, CKMB, CKMBINDEX, TROPONINI in the last 168 hours. BNP (last 3 results) No results for input(s): PROBNP in the last 8760 hours. HbA1C: No results for input(s): HGBA1C in  the last 72 hours. CBG: No results for input(s): GLUCAP in the last 168 hours. Lipid Profile: No results for input(s): CHOL, HDL, LDLCALC, TRIG, CHOLHDL, LDLDIRECT in the last 72 hours. Thyroid Function Tests: No results for input(s): TSH, T4TOTAL, FREET4, T3FREE, THYROIDAB in the last 72 hours. Anemia Panel: No results for input(s): VITAMINB12, FOLATE, FERRITIN, TIBC, IRON, RETICCTPCT in the last 72 hours. Urine  analysis:    Component Value Date/Time   COLORURINE YELLOW 02/27/2018 1255   APPEARANCEUR HAZY (A) 02/27/2018 1255   LABSPEC 1.014 02/27/2018 1255   PHURINE 8.0 02/27/2018 1255   GLUCOSEU NEGATIVE 02/27/2018 1255   HGBUR NEGATIVE 02/27/2018 [place] 02/27/2018 1255   KETONESUR NEGATIVE 02/27/2018 1255   PROTEINUR NEGATIVE 02/27/2018 1255   UROBILINOGEN 1.0 03/15/2015 1117   NITRITE NEGATIVE 02/27/2018 1255   LEUKOCYTESUR SMALL (A) 02/27/2018 1255    Radiological Exams on Admission: DG Chest Port 1 View  Result Date: 10/20/2020 CLINICAL DATA:  Shortness of breath EXAM: PORTABLE CHEST 1 VIEW COMPARISON:  May 21, 2020 FINDINGS: The heart size and mediastinal contours are mildly enlarged. Overlying median sternotomy wires are present. A left-sided pacemaker seen with the lead tips in the right ventricle. Again noted is right perihilar scarring and interstitial markings. The visualized skeletal structures are unremarkable. IMPRESSION: No active disease.  Stable mild cardiomegaly. Electronically Signed   By: Prudencio Pair M.D.   On: 10/20/2020 17:36    EKG: Independently reviewed.  Assessment/Plan Principal Problem:   Hemoptysis Active Problems:   HTN (hypertension)   Atrial fibrillation (HCC)   Small cell lung cancer (HCC)   Chronic respiratory failure with hypoxia (HCC)   Acute on chronic systolic and diastolic heart failure, NYHA class 3 (HCC)   DM2 (diabetes mellitus, type 2) ([place])    1. Hemoptysis - 1. DDx includes acute on chronic CHF,  recurrent SCLC, and PE (among others). 2. Getting CTA chest to try and diagnose 2. HFrEF - 1. ? Acute and cause of hemoptysis and SOB? vs just chronic and alternative cause of hemoptysis and SOB 2. Got 40mg  IV lasix in ED 3. CHF pathway 4. Defer 2d echo for the moment until we know for sure if this is whats causing her hemoptysis 5. Will leave her on home meds for the moment 6. Strict intake and output 7. Daily BMP 8. Replace K 9. Tele monitor 3. HTN - 1. Cont home BP med 4. A.Fib - 1. Cont corlanor 2. Not on anticoagulation for this, see Dr. Clayborne Dana note 5. CAD - 1. Last stent was in March 2020 2. Holding ASA + Brilinta in setting of frank hemoptysis 3. Trop just 21 today 6. SCLC - 1. Currently in remission since 2015, though now with hemoptysis 2. CTA chest ordered 7. DM2 - 1. Cont home [place]  DVT prophylaxis: SCDs - due to hemoptysis of unclear etiology Code Status: Full Family Communication: No family in room Disposition Plan: Home after diagnosis and treatment for hemoptysis and breathing improved Consults called: None Admission status: Place in obs    Nocole Zammit, [place] [facility]  How to contact the [facility] Attending or Consulting provider [place] or covering provider during after hours [place], for this patient?  1. Check the care team in [facility] and look for a) attending/consulting [facility] provider listed and b) the [facility] team listed 2. Log into [email or web address]  Amion Physician Scheduling and messaging for groups and whole hospitals  On call and physician scheduling software for group practices, residents, hospitalists and other medical providers for call, clinic, rotation and shift schedules. On[employer] is a hospital-wide system for scheduling doctors and paging doctors on call. EasyPlot is for scientific plotting and data analysis.  [email or web address]  and use [place]'s universal password to access. If you do not have the password, please contact the  hospital operator.  3. Locate the [facility] provider  you are looking for under [facility] and page to a number that you can be directly reached. 4. If you still have difficulty reaching the provider, please page the [facility] (Director on Call) for the Hospitalists listed on amion for assistance.  10/20/2020, 8:36 PM

## 2020-10-20 NOTE — ED Triage Notes (Signed)
Pt arrived vai [facility] from home after pt had worsening s.o.b for 24 hours. Pt was put on CPAP by EMS with 100% sats. Pt states she is coughing up bright red blood. Pt states multiple heart attacks and CHF hx. Pt put on 2L by RT and pt maintaining 100%.

## 2020-10-21 ENCOUNTER — Observation Stay (HOSPITAL_COMMUNITY): Payer: BC Managed Care – PPO

## 2020-10-21 DIAGNOSIS — Z20822 Contact with and (suspected) exposure to covid-19: Secondary | ICD-10-CM | POA: Diagnosis present

## 2020-10-21 DIAGNOSIS — Z9221 Personal history of antineoplastic chemotherapy: Secondary | ICD-10-CM | POA: Diagnosis not present

## 2020-10-21 DIAGNOSIS — I252 Old myocardial infarction: Secondary | ICD-10-CM | POA: Diagnosis not present

## 2020-10-21 DIAGNOSIS — N289 Disorder of kidney and ureter, unspecified: Secondary | ICD-10-CM | POA: Diagnosis not present

## 2020-10-21 DIAGNOSIS — Z95810 Presence of automatic (implantable) cardiac defibrillator: Secondary | ICD-10-CM | POA: Diagnosis not present

## 2020-10-21 DIAGNOSIS — K5900 Constipation, unspecified: Secondary | ICD-10-CM | POA: Diagnosis present

## 2020-10-21 DIAGNOSIS — I480 Paroxysmal atrial fibrillation: Secondary | ICD-10-CM | POA: Diagnosis not present

## 2020-10-21 DIAGNOSIS — E876 Hypokalemia: Secondary | ICD-10-CM | POA: Diagnosis present

## 2020-10-21 DIAGNOSIS — J441 Chronic obstructive pulmonary disease with (acute) exacerbation: Secondary | ICD-10-CM | POA: Diagnosis present

## 2020-10-21 DIAGNOSIS — I255 Ischemic cardiomyopathy: Secondary | ICD-10-CM | POA: Diagnosis present

## 2020-10-21 DIAGNOSIS — R042 Hemoptysis: Secondary | ICD-10-CM

## 2020-10-21 DIAGNOSIS — Z7902 Long term (current) use of antithrombotics/antiplatelets: Secondary | ICD-10-CM | POA: Diagnosis not present

## 2020-10-21 DIAGNOSIS — I5023 Acute on chronic systolic (congestive) heart failure: Secondary | ICD-10-CM

## 2020-10-21 DIAGNOSIS — I10 Essential (primary) hypertension: Secondary | ICD-10-CM | POA: Diagnosis not present

## 2020-10-21 DIAGNOSIS — Z85118 Personal history of other malignant neoplasm of bronchus and lung: Secondary | ICD-10-CM | POA: Diagnosis not present

## 2020-10-21 DIAGNOSIS — E119 Type 2 diabetes mellitus without complications: Secondary | ICD-10-CM | POA: Diagnosis present

## 2020-10-21 DIAGNOSIS — E785 Hyperlipidemia, unspecified: Secondary | ICD-10-CM | POA: Diagnosis present

## 2020-10-21 DIAGNOSIS — I9589 Other hypotension: Secondary | ICD-10-CM | POA: Diagnosis present

## 2020-10-21 DIAGNOSIS — F419 Anxiety disorder, unspecified: Secondary | ICD-10-CM | POA: Diagnosis present

## 2020-10-21 DIAGNOSIS — I2510 Atherosclerotic heart disease of native coronary artery without angina pectoris: Secondary | ICD-10-CM | POA: Diagnosis present

## 2020-10-21 DIAGNOSIS — Z951 Presence of aortocoronary bypass graft: Secondary | ICD-10-CM | POA: Diagnosis not present

## 2020-10-21 DIAGNOSIS — I509 Heart failure, unspecified: Secondary | ICD-10-CM | POA: Diagnosis not present

## 2020-10-21 DIAGNOSIS — Z8249 Family history of ischemic heart disease and other diseases of the circulatory system: Secondary | ICD-10-CM | POA: Diagnosis not present

## 2020-10-21 DIAGNOSIS — Z955 Presence of coronary angioplasty implant and graft: Secondary | ICD-10-CM | POA: Diagnosis not present

## 2020-10-21 DIAGNOSIS — Z923 Personal history of irradiation: Secondary | ICD-10-CM | POA: Diagnosis not present

## 2020-10-21 DIAGNOSIS — I110 Hypertensive heart disease with heart failure: Secondary | ICD-10-CM | POA: Diagnosis present

## 2020-10-21 DIAGNOSIS — I5043 Acute on chronic combined systolic (congestive) and diastolic (congestive) heart failure: Secondary | ICD-10-CM | POA: Diagnosis not present

## 2020-10-21 LAB — ECHOCARDIOGRAM COMPLETE
Area-P 1/2: 2.66 cm2
Height: 59 in
P 1/2 time: 292 msec
S' Lateral: 4.7 cm
Weight: 1680 oz

## 2020-10-21 LAB — BLOOD GAS, VENOUS
Acid-base deficit: 0.9 mmol/L (ref 0.0–2.0)
Bicarbonate: 23.4 mmol/L (ref 20.0–28.0)
FIO2: 35
O2 Saturation: 97.5 %
Patient temperature: 37
pCO2, Ven: 40 mmHg — ABNORMAL LOW (ref 44.0–60.0)
pH, Ven: 7.385 (ref 7.250–7.430)
pO2, Ven: 100 mmHg — ABNORMAL HIGH (ref 32.0–45.0)

## 2020-10-21 LAB — HIV ANTIBODY (ROUTINE TESTING W REFLEX): HIV Screen 4th Generation wRfx: NONREACTIVE

## 2020-10-21 LAB — BASIC METABOLIC PANEL
Anion gap: 9 (ref 5–15)
BUN: 17 mg/dL (ref 6–20)
CO2: 23 mmol/L (ref 22–32)
Calcium: 9.6 mg/dL (ref 8.9–10.3)
Chloride: 105 mmol/L (ref 98–111)
Creatinine, Ser: 1.03 mg/dL — ABNORMAL HIGH (ref 0.44–1.00)
GFR, Estimated: >60 mL/min (ref >60)
Glucose, Bld: 238 mg/dL — ABNORMAL HIGH (ref 70–99)
Potassium: 4.9 mmol/L (ref 3.5–5.1)
Sodium: 137 mmol/L (ref 135–145)

## 2020-10-21 LAB — CBG MONITORING, ED
Glucose-Capillary: 221 mg/dL — ABNORMAL HIGH (ref 70–99)
Glucose-Capillary: 145 mg/dL — ABNORMAL HIGH (ref 70–99)
Glucose-Capillary: 163 mg/dL — ABNORMAL HIGH (ref 70–99)

## 2020-10-21 LAB — HEMOGLOBIN A1C
Hgb A1c MFr Bld: 6 % — ABNORMAL HIGH (ref 4.8–5.6)
Mean Plasma Glucose: 125.5 mg/dL

## 2020-10-21 LAB — GLUCOSE, CAPILLARY
Glucose-Capillary: 139 mg/dL — ABNORMAL HIGH (ref 70–99)
Glucose-Capillary: 139 mg/dL — ABNORMAL HIGH (ref 70–99)

## 2020-10-21 MED ORDER — DOXYCYCLINE HYCLATE 100 MG PO TABS
100.0000 mg | ORAL_TABLET | Freq: Two times a day (BID) | ORAL | Status: AC
  Administered 2020-10-21 – 2020-10-25 (×9): 100 mg via ORAL
  Filled 2020-10-21 (×9): qty 1

## 2020-10-21 MED ORDER — DAPAGLIFLOZIN PROPANEDIOL 10 MG PO TABS
10.0000 mg | ORAL_TABLET | Freq: Every day | ORAL | Status: DC
  Administered 2020-10-22: 10 mg via ORAL
  Filled 2020-10-21 (×3): qty 1

## 2020-10-21 MED ORDER — FUROSEMIDE 10 MG/ML IJ SOLN
40.0000 mg | Freq: Two times a day (BID) | INTRAMUSCULAR | Status: AC
  Administered 2020-10-21: 40 mg via INTRAVENOUS

## 2020-10-21 MED ORDER — IPRATROPIUM-ALBUTEROL 0.5-2.5 (3) MG/3ML IN SOLN
3.0000 mL | Freq: Four times a day (QID) | RESPIRATORY_TRACT | Status: DC
  Administered 2020-10-21 – 2020-10-24 (×9): 3 mL via RESPIRATORY_TRACT
  Filled 2020-10-21 (×10): qty 3

## 2020-10-21 MED ORDER — ALBUTEROL SULFATE HFA 108 (90 BASE) MCG/ACT IN AERS: 2.0000 | INHALATION_SPRAY | RESPIRATORY_TRACT | Status: DC | PRN

## 2020-10-21 MED ORDER — FUROSEMIDE 10 MG/ML IJ SOLN
40.0000 mg | Freq: Two times a day (BID) | INTRAMUSCULAR | Status: DC
  Filled 2020-10-21: qty 4

## 2020-10-21 MED ORDER — INSULIN ASPART 100 UNIT/ML ~~LOC~~ SOLN
0.0000 [IU] | SUBCUTANEOUS | Status: DC
  Administered 2020-10-21: 1 [IU] via SUBCUTANEOUS
  Administered 2020-10-21: 3 [IU] via SUBCUTANEOUS
  Administered 2020-10-21 (×2): 1 [IU] via SUBCUTANEOUS
  Administered 2020-10-21 – 2020-10-22 (×3): 2 [IU] via SUBCUTANEOUS
  Administered 2020-10-22 (×2): 1 [IU] via SUBCUTANEOUS
  Administered 2020-10-22: 2 [IU] via SUBCUTANEOUS
  Administered 2020-10-22 – 2020-10-23 (×2): 3 [IU] via SUBCUTANEOUS
  Administered 2020-10-23: 5 [IU] via SUBCUTANEOUS
  Administered 2020-10-23: 3 [IU] via SUBCUTANEOUS
  Administered 2020-10-24: 2 [IU] via SUBCUTANEOUS
  Administered 2020-10-24: 1 [IU] via SUBCUTANEOUS
  Administered 2020-10-24 (×2): 2 [IU] via SUBCUTANEOUS
  Administered 2020-10-25: 5 [IU] via SUBCUTANEOUS
  Administered 2020-10-25 (×2): 3 [IU] via SUBCUTANEOUS
  Administered 2020-10-25 – 2020-10-26 (×2): 2 [IU] via SUBCUTANEOUS

## 2020-10-21 MED ORDER — METHYLPREDNISOLONE SODIUM SUCC 40 MG IJ SOLR
40.0000 mg | Freq: Two times a day (BID) | INTRAMUSCULAR | Status: DC
  Administered 2020-10-21 – 2020-10-22 (×3): 40 mg via INTRAVENOUS
  Filled 2020-10-21 (×3): qty 1

## 2020-10-21 NOTE — ED Notes (Signed)
RT called for BiPAP and pt evaluation, per MD

## 2020-10-21 NOTE — Consult Note (Addendum)
Advanced Heart Failure Team Consult Note   Primary Physician: Scifres, [name], PA-C PCP-Cardiologist:  No primary care provider on file.  Reason for Consultation: Heart Failure   HPI:    Brittany [name] is seen today for evaluation of heart failure at the request of Dr Lake Bells.   Ms Mickeal Needy is a 59 year old with a history of PAF, previous smoker, HTN, COPD, small cell lung cancer treated with chemo, radiation chest/brain, CABG, and chronic systolic heart failure.   Admitted 02/03/2018 with NSTEMI and shock. Underwent emergent cath 02/03/18 showed LAD 100% stenosed, LCx 95% stenosed. Taken for emergent CABG 02/03/18. Required impella post op. Hospital course complicated by cardiogenic shock, HCAP, A fib, respiratory failure, and swallowing issues. She was discharged to SNF.    In 2019 had multiple hospitalizations for HF and pleural effusion. Underwent pleurodesis at [facility].  Admitted 3/4 - 02/14/2019 with NSTEMIand HF. Received DES to ostial ramus into distal left main based on cath belowand underwent diuresis. Meds adjusted as tolerated. Echo with EF 25-30%  Admitted 6/11-14/21 with hypoxia felt to be combination of COPD and HF.  She was in her usual state of health until 3 days ago when she started coughing up some blood but worsened with large clots. Starting wearing 2 liters of oxygen at night. Weight at home 107-110 pounds. She had been taking all medications.   Presented with hemoptysis. CXR with no acute findings. CTA - no PE and stable post therapeutic changes at right hilum  From prevous radiation therapy. . Pertinent lab work included: creatinine 0.9, K 3.2, BNP 1118, WBC 7.3, Hgb 15.9, and HS trop 21>21. Started on IV lasix 40 mg twice a day. Asa and brillinta held.   CCM consulted for hemoptysis. Started on steroids and nebs. Plan for bronchoscopy.  Reds Clip 47%  Echo LHC 2020- DES to ostial ramus into distal left main LHC 2019 -LAD 100% stenosed, LCx  95% stenosed. Taken for emergent CABG 02/03/18  Review of Systems: [y] = yes, [ ]  = no   . General: Weight gain [ ] ; Weight loss [ ] ; Anorexia [ ] ; Fatigue [ Y]; Fever [ ] ; Chills [ ] ; Weakness [ Y]  . Cardiac: Chest pain/pressure [ ] ; Resting SOB [ ] ; Exertional SOB [Y ]; Orthopnea [ ] ; Pedal Edema [ ] ; Palpitations [ ] ; Syncope [ ] ; Presyncope [ ] ; Paroxysmal nocturnal dyspnea[ ]   . Pulmonary: Cough [Y ]; Wheezing[ ] ; Hemoptysis[ Y]; Sputum [ ] ; Snoring [ ]   . GI: Vomiting[ ] ; Dysphagia[ ] ; Melena[ ] ; Hematochezia [ ] ; Heartburn[ ] ; Abdominal pain [ ] ; Constipation [ ] ; Diarrhea [ ] ; BRBPR [ ]   . GU: Hematuria[ ] ; Dysuria [ ] ; Nocturia[ ]   . Vascular: Pain in legs with walking [ ] ; Pain in feet with lying flat [ ] ; Non-healing sores [ ] ; Stroke [ ] ; TIA [ ] ; Slurred speech [ ] ;  . Neuro: Headaches[ ] ; Vertigo[ ] ; Seizures[ ] ; Paresthesias[ ] ;Blurred vision [ ] ; Diplopia [ ] ; Vision changes [ ]   . Ortho/Skin: Arthritis [ ] ; Joint pain [Y ]; Muscle pain [ ] ; Joint swelling [ ] ; Back Pain [ Y]; Rash [ ]   . Psych: Depression[ ] ; Anxiety[ ]   . Heme: Bleeding problems [ ] ; Clotting disorders [ ] ; Anemia [ ]   . Endocrine: Diabetes [ ] ; Thyroid dysfunction[ ]   Home Medications Prior to Admission medications   Medication Sig Start Date End Date Taking? Authorizing Provider  albuterol (VENTOLIN HFA) 108 (90 Base) MCG/ACT inhaler  Inhale 2 puffs into the lungs every 6 (six) hours as needed for wheezing or shortness of breath. 04/16/19  Yes Bensimhon, Shaune Pascal, MD  ALPRAZolam Duanne Moron) 0.25 MG tablet Take 1 tablet (0.25 mg total) by mouth daily as needed for anxiety. 02/24/19  Yes Shirley Friar, PA-C  aspirin 81 MG EC tablet Take 1 tablet (81 mg total) by mouth daily. 07/03/18  Yes Bensimhon, Shaune Pascal, MD  atorvastatin (LIPITOR) 80 MG tablet Take 1 tablet (80 mg total) by mouth daily at 6 PM. 07/03/18  Yes Bensimhon, Shaune Pascal, MD  dapagliflozin propanediol (FARXIGA) 10 MG TABS tablet Take 1 tablet (10  mg total) by mouth daily before breakfast. 06/11/20  Yes Bensimhon, Shaune Pascal, MD  ipratropium-albuterol (DUONEB) 0.5-2.5 (3) MG/3ML SOLN Take 3 mLs by nebulization 2 (two) times daily as needed.   Yes [provider]  ivabradine (CORLANOR) 7.5 MG TABS tablet Take 1 tablet (7.5 mg total) by mouth 2 (two) times daily with a meal. Patient taking differently: Take 11.25 mg by mouth 2 (two) times daily with a meal.  02/19/20  Yes Larey Dresser, MD  nitroGLYCERIN (NITROSTAT) 0.4 MG SL tablet Place 1 tablet (0.4 mg total) under the tongue every 5 (five) minutes as needed for chest pain. 02/14/19  Yes Shirley Friar, PA-C  potassium chloride SA (K-DUR,KLOR-CON) 20 MEQ tablet Take 1 tablet (20 mEq total) by mouth daily. 02/26/19  Yes Shirley Friar, PA-C  spironolactone (ALDACTONE) 25 MG tablet Take 1 tablet (25 mg total) by mouth daily. 04/12/20  Yes Allred, Jeneen Rinks, MD  ticagrelor (BRILINTA) 90 MG TABS tablet Take 1 tablet (90 mg total) by mouth 2 (two) times daily. 05/17/20  Yes Simmons, Brittainy M, PA-C  torsemide (DEMADEX) 20 MG tablet Take 60 mg daily. May take an extra 20 mg PRN for 3lb weightt gain in 24 hrs. 06/18/20  Yes Bensimhon, Shaune Pascal, MD    Past Medical History: Past Medical History:  Diagnosis Date  . Acute systolic congestive heart failure ([place]) 02/03/2018  . Allergy   . Anxiety   . Asthma   . DM2 (diabetes mellitus, type 2) ([place]) 10/20/2020  . Hypertension   . PAF (paroxysmal atrial fibrillation) ([place])   . Prophylactic measure 08/03/14-08/19/14   Prophyl. cranial radiation 24 Gy  . 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 Small cell lung cancer ([place]) 03/16/2014    Past Surgical History: Past Surgical History:  Procedure Laterality Date  . CARDIAC DEFIBRILLATOR PLACEMENT  08/15/2018   MDT Visia AF MRI VR ICD implanted by Dr Loel Lofty for primary  prevention of sudden  . CESAREAN SECTION    . CORONARY ARTERY BYPASS GRAFT N/A 02/03/2018   Procedure: CORONARY ARTERY BYPASS GRAFTING (CABG);  Surgeon: Rexene Alberts, MD;  Location: [place];  Service: Open Heart Surgery;  Laterality: N/A;  Time 3 using left internal mammary artery and endoscopically harvested right saphenous vein  . CORONARY BALLOON ANGIOPLASTY N/A 02/03/2018   Procedure: CORONARY BALLOON ANGIOPLASTY;  Surgeon: Martinique, Peter M, MD;  Location: [place] [facility];  Service: Cardiovascular;  Laterality: N/A;  . CORONARY STENT INTERVENTION N/A 02/12/2019   Procedure: CORONARY STENT INTERVENTION;  Surgeon: Wellington Hampshire, MD;  Location: [place] [facility];  Service: Cardiovascular;  Laterality: N/A;  . CORONARY/GRAFT ACUTE MI REVASCULARIZATION N/A 02/03/2018   Procedure:  Coronary/Graft Acute MI Revascularization;  Surgeon: Martinique, Peter M, MD;  Location: [place] [facility];  Service: Cardiovascular;  Laterality: N/A;  . IABP INSERTION N/A 02/03/2018   Procedure: IABP Insertion;  Surgeon: Martinique, Peter M, MD;  Location: [place] [facility];  Service: Cardiovascular;  Laterality: N/A;  . INTRAOPERATIVE TRANSESOPHAGEAL ECHOCARDIOGRAM N/A 02/03/2018   Procedure: INTRAOPERATIVE TRANSESOPHAGEAL ECHOCARDIOGRAM;  Surgeon: Rexene Alberts, MD;  Location: [place];  Service: Open Heart Surgery;  Laterality: N/A;  . LEFT HEART CATH AND CORONARY ANGIOGRAPHY N/A 02/03/2018   Procedure: LEFT HEART CATH AND CORONARY ANGIOGRAPHY;  Surgeon: Martinique, Peter M, MD;  Location: [place] [facility];  Service: Cardiovascular;  Laterality: N/A;  . LEFT HEART CATH AND CORS/GRAFTS ANGIOGRAPHY N/A 02/12/2019   Procedure: LEFT HEART CATH AND CORS/GRAFTS ANGIOGRAPHY;  Surgeon: Wellington Hampshire, MD;  Location: [place] [facility];  Service: Cardiovascular;  Laterality: N/A;  . MEDIASTINOSCOPY N/A 03/11/2014   Procedure: MEDIASTINOSCOPY;  Surgeon: Melrose Nakayama, MD;  Location: [place];  Service: Thoracic;  Laterality: N/A;    . PLACEMENT OF [place] LEFT VENTRICULAR ASSIST DEVICE  02/03/2018   Procedure: PLACEMENT OF [place] LEFT VENTRICULAR ASSIST DEVICE LD;  Surgeon: Rexene Alberts, MD;  Location: [place];  Service: Open Heart Surgery;;  . REMOVAL OF IMPELLA LEFT VENTRICULAR ASSIST DEVICE N/A 02/08/2018   Procedure: REMOVAL OF [place] LEFT VENTRICULAR ASSIST DEVICE;  Surgeon: Rexene Alberts, MD;  Location: [place];  Service: Open Heart Surgery;  Laterality: N/A;  . RIGHT HEART CATH N/A 02/03/2018   Procedure: RIGHT HEART CATH;  Surgeon: Martinique, Peter M, MD;  Location: [place] [facility];  Service: Cardiovascular;  Laterality: N/A;  . RIGHT HEART CATH N/A 05/09/2018   Procedure: RIGHT HEART CATH;  Surgeon: Jolaine Artist, MD;  Location: [place] [facility];  Service: Cardiovascular;  Laterality: N/A;  . TEE WITHOUT CARDIOVERSION N/A 02/08/2018   Procedure: TRANSESOPHAGEAL ECHOCARDIOGRAM (TEE);  Surgeon: Rexene Alberts, MD;  Location: [place];  Service: Open Heart Surgery;  Laterality: N/A;  . TUBAL LIGATION    . VIDEO BRONCHOSCOPY WITH ENDOBRONCHIAL ULTRASOUND N/A 03/11/2014   Procedure: VIDEO BRONCHOSCOPY WITH ENDOBRONCHIAL ULTRASOUND;  Surgeon: Melrose Nakayama, MD;  Location: [facility];  Service: Thoracic;  Laterality: N/A;    Family History: Family History  Problem Relation Age of Onset  . Heart disease Mother   . Hypertension Mother   . Heart attack Mother   . Hypertension Maternal Grandmother   . Cancer Maternal Grandmother   . Diabetes Paternal Grandmother   . Stroke Neg Hx     Social History: Social History   Socioeconomic History  . Marital status: Married    Spouse name: Not on file  . Number of children: Not on file  . Years of education: Not on file  . Highest education level: Not on file  Occupational History  . Not on file  Tobacco Use  . Smoking status: Former Smoker    Packs/day: 1.00    Years: 20.00    Pack years: 20.00    Types: Cigarettes    Quit date: 03/13/2014    Years since  quitting: 6.6  . Smokeless tobacco: Never Used  Vaping Use  . Vaping Use: Never used  Substance and Sexual Activity  . Alcohol use: Yes    Alcohol/week: 8.0 standard drinks    Types: 8 Glasses of wine per week  . Drug use: Yes    Types: Marijuana  Comment: "medicinal"  . Sexual activity: Never  Other Topics Concern  . Not on file  Social History Narrative  . Not on file   Social Determinants of Health   Financial Resource Strain:   . Difficulty of Paying Living Expenses: Not on file  Food Insecurity:   . Worried About Charity fundraiser in the Last Year: Not on file  . Ran Out of Food in the Last Year: Not on file  Transportation Needs:   . Lack of Transportation (Medical): Not on file  . Lack of Transportation (Non-Medical): Not on file  Physical Activity:   . Days of Exercise per Week: Not on file  . Minutes of Exercise per Session: Not on file  Stress:   . Feeling of Stress : Not on file  Social Connections:   . Frequency of Communication with Friends and Family: Not on file  . Frequency of Social Gatherings with Friends and Family: Not on file  . Attends Religious Services: Not on file  . Active Member of Clubs or Organizations: Not on file  . Attends Archivist Meetings: Not on file  . Marital Status: Not on file    Allergies:  Allergies  Allergen Reactions  . Codeine Nausea And Vomiting    Objective:    Vital Signs:   Temp:  [97.8 F (36.6 C)-98.8 F (37.1 C)] 98.8 F (37.1 C) (11/11 0800) Pulse Rate:  [60-79] 71 (11/11 1030) Resp:  [12-27] 19 (11/11 1030) BP: (80-133)/(51-96) 125/67 (11/11 1030) SpO2:  [98 %-100 %] 100 % (11/11 1030) Weight:  [47.6 kg] 47.6 kg (11/10 1648)    Weight change: Filed Weights   10/20/20 1648  Weight: 47.6 kg    Intake/Output:   Intake/Output Summary (Last 24 hours) at 10/21/2020 1046 Last data filed at 10/21/2020 0218 Gross per 24 hour  Intake 200 ml  Output --  Net 200 ml      Physical Exam     General:  Thin appearing. No resp difficulty HEENT: normal Neck: supple. JVP 9-10 . Carotids 2+ bilat; no bruits. No lymphadenopathy or thyromegaly appreciated. Cor: PMI nondisplaced. Regular rate & rhythm. No rubs, gallops or murmurs. Lungs: EW/IW throughout.  Abdomen: soft, nontender, nondistended. No hepatosplenomegaly. No bruits or masses. Good bowel sounds. Extremities: no cyanosis, clubbing, rash, edema Neuro: alert & orientedx3, cranial nerves grossly intact. moves all 4 extremities w/o difficulty. Affect pleasant   Telemetry   SR 60-70s   EKG   SR   Labs   Basic Metabolic Panel: Recent Labs  Lab 10/20/20 1702 10/20/20 1714 10/21/20 0251  NA 141 144  142 137  K 3.2* 3.1*  3.1* 4.9  CL 100 101 105  CO2 27  --  23  GLUCOSE 130* 125* 238*  BUN 10 15 17   CREATININE 0.92 0.90 1.03*  CALCIUM 9.8  --  9.6    Liver Function Tests: Recent Labs  Lab 10/20/20 1702  AST 23  ALT 15  ALKPHOS 90  BILITOT 1.5*  PROT 7.6  ALBUMIN 4.2   No results for input(s): LIPASE, AMYLASE in the last 168 hours. No results for input(s): AMMONIA in the last 168 hours.  CBC: Recent Labs  Lab 10/20/20 1702 10/20/20 1714  WBC 7.3  --   NEUTROABS 5.3  --   HGB 15.9* 17.7*  18.0*  HCT 50.2* 52.0*  53.0*  MCV 94.2  --   PLT 205  --     Cardiac Enzymes: No results for  input(s): CKTOTAL, CKMB, CKMBINDEX, TROPONINI in the last 168 hours.  BNP: BNP (last 3 results) Recent Labs    05/21/20 1102 10/20/20 1702  BNP 896.5* 1,118.4*    ProBNP (last 3 results) No results for input(s): PROBNP in the last 8760 hours.   CBG: Recent Labs  Lab 10/21/20 0548 10/21/20 0821  GLUCAP 221* 145*    Coagulation Studies: No results for input(s): LABPROT, INR in the last 72 hours.   Imaging   CT Angio Chest PE W and/or Wo Contrast  Result Date: 10/20/2020 CLINICAL DATA:  Hemoptysis, coronary artery disease, short of breath, history of small cell lung cancer EXAM: CT  ANGIOGRAPHY CHEST WITH CONTRAST TECHNIQUE: Multidetector CT imaging of the chest was performed using the standard protocol during bolus administration of intravenous contrast. Multiplanar CT image reconstructions and MIPs were obtained to evaluate the vascular anatomy. CONTRAST:  19mL OMNIPAQUE IOHEXOL 350 MG/ML SOLN COMPARISON:  05/21/2020, 10/20/2020 FINDINGS: Cardiovascular: This is a technically adequate evaluation of the pulmonary vasculature. No filling defects or pulmonary emboli. Heart is enlarged, but stable. Postsurgical changes are seen from previous bypass surgery. Minimal atherosclerosis of the aortic arch. Mediastinum/Nodes: Right hilar prominence again noted, unchanged and likely representing post therapeutic changes from previous lung cancer treatment. There is chronic narrowing of the right upper, right middle, and right lower lobe bronchi. No pathologic adenopathy. Thyroid, trachea, and esophagus are unremarkable. Lungs/Pleura: Diffuse emphysema again noted. No acute airspace disease, effusion, or pneumothorax. Right perihilar consolidation consistent with chronic scarring from previous radiation therapy for lung cancer. Upper Abdomen: No acute abnormality. Musculoskeletal: No acute or destructive bony lesions. Reconstructed images demonstrate no additional findings. Review of the MIP images confirms the above findings. IMPRESSION: 1. No evidence of pulmonary embolus. 2. Stable post therapeutic changes at the right hilum from prior radiation therapy for lung cancer. 3. Cardiomegaly. 4. Aortic Atherosclerosis (ICD10-I70.0) and Emphysema (ICD10-J43.9). Electronically Signed   By: Randa Ngo M.D.   On: 10/20/2020 21:44   DG Chest Port 1 View  Result Date: 10/20/2020 CLINICAL DATA:  Shortness of breath EXAM: PORTABLE CHEST 1 VIEW COMPARISON:  May 21, 2020 FINDINGS: The heart size and mediastinal contours are mildly enlarged. Overlying median sternotomy wires are present. A left-sided  pacemaker seen with the lead tips in the right ventricle. Again noted is right perihilar scarring and interstitial markings. The visualized skeletal structures are unremarkable. IMPRESSION: No active disease.  Stable mild cardiomegaly. Electronically Signed   By: Prudencio Pair M.D.   On: 10/20/2020 17:36      Medications:     Current Medications: . atorvastatin  80 mg Oral q1800  . insulin aspart  0-9 Units Subcutaneous Q4H  . ipratropium-albuterol  3 mL Nebulization Q6H  . ivabradine  11.25 mg Oral BID WC  . methylPREDNISolone (SOLU-MEDROL) injection  40 mg Intravenous Q12H  . potassium chloride SA  20 mEq Oral Daily  . sodium chloride flush  3 mL Intravenous Q12H  . spironolactone  25 mg Oral Daily  . torsemide  60 mg Oral Daily     Infusions: . sodium chloride         Assessment/Plan   1. Hemoptysis Off asa and brillinta.   CCM following. Plan for Bronchoscopy next week.  - Started on steroids and nebs.   2. A/C Systolic Heart Failure, ICM  -Echo 02/12/19: EF 25-30%, RV mildly reduced -Reds Clip 47% suggestive of volume overload. On exam she does have elevated JVP. BNP >10000. Stop torsemide and IV  lasix. .  - Can continue spironolactone.  - Renal function stable.   3. COPD  -Known asthmatic/former smoker  4. CAD -NSTEMI/STEMI s/p Emergent CABG 02/03/18: Cath with severe 2v CAD as above with severe LV dysfunction/ICM. s/p Emergent CABG 02/03/18. - Had NSTEMI and now s/p LHC 02/12/19 with DES to the ostial ramus extending into the distal left main - HS trop 21>21  - Off aspirin. Can stay off brillinta given DES back in 02/2019.  - Continue high intensity statin.   5. H/O SCLC s/p treatment 2015 - Had chemo/radiation -Followed by Dr [name]  6. PAF  -In NSR today  -No A/C hemoptysis   Length of Stay: 0  Amy Clegg, NP  10/21/2020, 10:46 AM  Advanced Heart Failure Team Pager [phone] (M-F; 7a - 4p)  Please contact [facility] for night-coverage after  hours (4p -7a ) and weekends on [email or web address]  Patient seen with NP, agree with the above note.   Patient with history of CAD, ischemic cardiomyopathy, and small cell lung CA was admitted with hemoptysis and increased exertional dyspnea.  She has a history of COPD and has been wheezing.  REDS clip 47%.    General: NAD Neck: JVP 12 cm, no thyromegaly or thyroid nodule.  Lungs: Rhonchi bilaterally.  CV: Nondisplaced PMI.  Heart regular S1/S2, no S3/S4, no murmur.  No peripheral edema.  No carotid bruit.  Normal pedal pulses.  Abdomen: Soft, nontender, no hepatosplenomegaly, no distention.  Skin: Intact without lesions or rashes.  Neurologic: Alert and oriented x 3.  Psych: Normal affect. Extremities: No clubbing or cyanosis.  HEENT: Normal.   New hemoptysis in patient with history of small cell lung CA.  CTA chest with no PE, stable scarring at right hilum and emphysema.  Possible hemoptysis in setting of COPD/bronchitis exacerbation, but worrisome with cancer history.  - Treat with steroids, nebs and doxycycline => suspect there is a component of AECOPD.  - Pulmonary following, will have bronchoscopy Monday after ticagrelor wash-out. - Stopped ASA and Brilinta.  She can stay off [place] in the future (no recent PCI).    Acute on chronic systolic CHF, ischemic cardiomyopathy.  Last echo with EF 25-30% in 2020.  She is volume overloaded on exam and by REDS clip.  - Lasix 40 mg IV bid and follow response.  - Continue ivabradine and spironolactone.  - Continue Farxiga 10 daily.  - Repeat echo.  - Medical management has been limited historically by low BP.   Loralie Champagne 10/21/2020 12:47 PM  Echo reviewed, EF 20-25% with diffuse hypokinesis, mildly decreased RV systolic function.  The IVC is not dilated, suggesting that she is not significantly volume overloaded.  I wonder if the REDS clip is not elevated due to hemoptysis/blood in lungs.  Will give 1 more dose of IV Lasix 40 mg this evening  then reassess tomorrow.   Loralie Champagne 10/21/2020 4:39 PM

## 2020-10-21 NOTE — Progress Notes (Signed)
Pt maintaining 100% O2 sat even on room air, but WOB increased overnight, patient seen at ~0445.  Increased WOB and cough productive of bright red bloody sputum.  Only small amount of UOP despite IV lasix.  Pt does note that previously she has needed Lasix gtt to get good UOP in past.  Though she admits she has never had hemoptysis before.  Got PCCM to see patient at bedside, appreciate their help.  They recd no change to diuretics at the moment, they also suspect that CHF isnt necessarily her big issue today, but rather bronchial irritation due to bleeding endobronchial lesion.  PCCM reviewed CT scan and actually suspects he can see what he thinks might be an endobronchial lesion in trachea.  Plan currently is for Bronchoscopy later this morning.  Keeping pt NPO.  Pt breathing well on BIPAP.

## 2020-10-21 NOTE — Progress Notes (Signed)
[facility] P[facility]  S: admitted overnight, has history of advanced heart failure, prior small cell lung cancer treated 7 years ago.  Admitted in setting of hemoptysis, dyspnea, was on BIPAP, now off, hemoptysis has slowed down  O: Vitals:   10/21/20 0600 10/21/20 0630 10/21/20 0727 10/21/20 0800  BP: 99/70 110/74 111/62 110/70  Pulse: 62 60 65 70  Resp: 19 (!) 21 (!) 21 18  Temp:   97.8 F (36.6 C) 98.8 F (37.1 C)  TempSrc:   Oral Oral  SpO2: 100% 100% 100% 100%  Weight:      Height:       2L [place]  General:  Resting comfortably in bed HENT: NCAT OP clear PULM: Wheezing bilaterally B, normal effort CV: RRR, no mgr GI: BS+, soft, nontender MSK: normal bulk and tone Neuro: awake, alert, no distress, MAEW  CBC    Component Value Date/Time   WBC 7.3 10/20/2020 1702   RBC 5.33 (H) 10/20/2020 1702   HGB 18.0 (H) 10/20/2020 1714   HGB 17.7 (H) 10/20/2020 1714   HGB 15.0 01/13/2020 1406   HGB 9.1 (L) 03/14/2018 1619   HGB 17.6 (H) 12/23/2014 0829   HCT 53.0 (H) 10/20/2020 1714   HCT 52.0 (H) 10/20/2020 1714   HCT 28.7 (L) 03/14/2018 1619   HCT 53.9 (H) 12/23/2014 0829   PLT 205 10/20/2020 1702   PLT 237 01/13/2020 1406   PLT 402 (H) 03/14/2018 1619   MCV 94.2 10/20/2020 1702   MCV 90 03/14/2018 1619   MCV 102.1 (H) 12/23/2014 0829   MCH 29.8 10/20/2020 1702   MCHC 31.7 10/20/2020 1702   RDW 19.9 (H) 10/20/2020 1702   RDW 16.9 (H) 03/14/2018 1619   RDW 20.9 (H) 12/23/2014 0829   LYMPHSABS 1.2 10/20/2020 1702   LYMPHSABS 0.7 (L) 12/23/2014 0829   MONOABS 0.6 10/20/2020 1702   MONOABS 0.4 12/23/2014 0829   EOSABS 0.1 10/20/2020 1702   EOSABS 0.2 12/23/2014 0829   BASOSABS 0.1 10/20/2020 1702   BASOSABS 0.0 12/23/2014 0829   CT images personally reviewed: R hilar fullness, unchanged compared to prior, air trapping, no PE  Impression: Hemoptysis Asthma exacerbation Advanced heart failure CAD s/p PCI on ticagrelor  Discussion: She needs a diagnostic bronchoscopy but this  is complicated by her being on Ticagrelor (can't biopsy).  If she develops worsening hemoptysis today then we will need to intubate her and perform an emergent bronchoscopy to guide any sort of emergent treatment (cryo/IR/etc).  This could be as simple as bronchitis causing hemoptysis in an asthma exacerbation (on ticagrelor), but we obviously are worried about the possibility of recurrent malignancy given her history.  Thankfully, there is no obvious growth in the mass by imaging but her airways near the mass appear bronchiectatic.  Plan: Admit to [place] Add solumedrol now Adjust duoneb to q6h Adjust albuterol to q4h prn Needs outpatient PFT Bronchoscopy: plan early next week after being off Brilinta but can do sooner if bleeding worsens  Roselie Awkward, MD [facility] Pager: [phone] Cell: [phone] If no response, call [phone]

## 2020-10-21 NOTE — Progress Notes (Signed)
  Echocardiogram 2D Echocardiogram has been performed.  Brittany [name] 10/21/2020, 4:30 PM

## 2020-10-21 NOTE — Progress Notes (Addendum)
[facility]  PROGRESS NOTE  Brittany [name] LOV:[id number] DOB: Jan 04, 1961 DOA: 10/20/2020 PCP: Maude Leriche, PA-C Admit date - 10/20/2020   Admitting Physician Etta Quill, DO  Outpatient Primary MD for the patient is Scifres, [name], PA-C  LOS - 0 Brief Narrative  [name] is a 58 year old female with medical history significant for not limited to paroxysmal atrial fibrillation, COPD, SCLC treated with chemo, previous medical, chronic systolic CHF who presents with 2 days of worsening cough associated with hemoptysis with large blood clots, and new need for supplemental O2 at 2 L nasal cannula at home as well as 3 pound weight gain.  She was found to have evidence of emphysema on CTA but no PE, elevated BNP patient was admitted with working diagnosis of acute hypoxic respiratory failure presumed secondary to combined acute on chronic systolic CHF exacerbation, acute exacerbation of COPD.  Hospital course complicated significant increased work of breathing requiring BiPAP   Subjective  Today she has been off BiPAP this morning.  Feels her breathing is much improved  A & P    Acute COPD exacerbation.  Required BiPAP due to increased work of breathing, no signs of infection chest x-ray or CTA chest, PE negative.  CHF constipation likely also contributed.  Patient with dyspnea, hemoptysis, taking home inhalers as well as Lasix.  Required BiPAP due to increased work of breathing, now stable on 3 L currently typically at home does not require oxygen  -Appreciate heart failure recommendations to continue IV Lasix twice daily closely monitor urine output --Continue CHF home regimen -Repeat TTE as recommended by CHF team -Appreciate PCCM recommendations, will continue scheduled duo nebs, doxycycline, IV Solu-Medrol, flutter valve, incentive spirometry  Acute on chronic systolic CHF with history of ischemic cardiomyopathy.  Last TTE in 2020.  Elevated BNP on  arrival, complains of orthopnea, PND with worsening cough and dyspnea on O2 requirements. -Appreciate CHF team recommendations, will continue IV Lasix -Daily output monitoring, strict I's and O's -Repeat TTE -Continue ivabradine, spironolactone Farxiga  Hemoptysis.  Could be related to acute exacerbation of COPD.  CTA showed stable scarring in the right hilum consistent with emphysema.  Patient does have history of NSCLC some concern -Continue COPD exacerbation regimen -PCCM following, likely bronchoscopy on 11/15, awaiting ticagrelor washout.  Per cardiology does not need to continue Brilinta in the future -Aspirin discontinued by cardiology  Hyperlipidemia, stable on -Home Lipitor     Family Communication  : None  Code Status : Full  Disposition Plan  :  Patient is from home. Anticipated d/c date: 2 to 3 days. Barriers to d/c or necessity for inpatient status:  IV Lasix, close monitoring of O2 status, TTE, culture, eventually bronchoscopy. Will change from observation to inpatient status Consults  : Cardiology, PCCM  Procedures  : TTE pending  DVT Prophylaxis  : SCDs  MDM: The below labs and imaging reports were reviewed and summarized above.  Medication management as above.  Lab Results  Component Value Date   PLT 205 10/20/2020    Diet :  Diet Order            Diet renal/carb modified with fluid restriction Diet-HS Snack? Nothing; Fluid restriction: 1200 mL Fluid; Room service appropriate? Yes; Fluid consistency: Thin  Diet effective now                  Inpatient Medications Scheduled Meds: . atorvastatin  80 mg Oral q1800  . dapagliflozin propanediol  10 mg Oral Daily  .  furosemide  40 mg Intravenous BID  . insulin aspart  0-9 Units Subcutaneous Q4H  . ipratropium-albuterol  3 mL Nebulization Q6H  . ivabradine  11.25 mg Oral BID WC  . methylPREDNISolone (SOLU-MEDROL) injection  40 mg Intravenous Q12H  . potassium chloride SA  20 mEq Oral Daily  . sodium  chloride flush  3 mL Intravenous Q12H  . spironolactone  25 mg Oral Daily   Continuous Infusions: . sodium chloride     PRN Meds:.sodium chloride, acetaminophen, albuterol, ALPRAZolam, ondansetron (ZOFRAN) IV, sodium chloride flush  Antibiotics  :   Anti-infectives (From admission, onward)   None       Objective   Vitals:   10/21/20 0945 10/21/20 1000 10/21/20 1030 10/21/20 1200  BP:  113/77 125/67 112/73  Pulse: 81 70 71 65  Resp: (!) 24 (!) 22 19 20   Temp:      TempSrc:      SpO2: 100% 100% 100% 100%  Weight:      Height:        SpO2: 100 % O2 Flow Rate (L/min): 3 L/min  Wt Readings from Last 3 Encounters:  10/20/20 47.6 kg  06/11/20 50.4 kg  05/24/20 49.8 kg     Intake/Output Summary (Last 24 hours) at 10/21/2020 1257 Last data filed at 10/21/2020 0218 Gross per 24 hour  Intake 200 ml  Output --  Net 200 ml    Physical Exam:    Thin female, in no acute distress Awake Alert, Oriented X 3, Normal affect No new F.N deficits,  [place].AT, Normal respiratory effort on 3L, increased rhonchi, wheezing RRR,No Gallops,Rubs or new Murmurs, no edema +ve B.Sounds, Abd Soft, No tenderness, No rebound, guarding or rigidity. No Cyanosis, No new Rash or bruise    I have personally reviewed the following:   Data Reviewed:  CBC Recent Labs  Lab 10/20/20 1702 10/20/20 1714  WBC 7.3  --   HGB 15.9* 17.7*  18.0*  HCT 50.2* 52.0*  53.0*  PLT 205  --   MCV 94.2  --   MCH 29.8  --   MCHC 31.7  --   RDW 19.9*  --   LYMPHSABS 1.2  --   MONOABS 0.6  --   EOSABS 0.1  --   BASOSABS 0.1  --     Chemistries  Recent Labs  Lab 10/20/20 1702 10/20/20 1714 10/21/20 0251  NA 141 144  142 137  K 3.2* 3.1*  3.1* 4.9  CL 100 101 105  CO2 27  --  23  GLUCOSE 130* 125* 238*  BUN 10 15 17   CREATININE 0.92 0.90 1.03*  CALCIUM 9.8  --  9.6  AST 23  --   --   ALT 15  --   --   ALKPHOS 90  --   --   BILITOT 1.5*  --   --     ------------------------------------------------------------------------------------------------------------------ No results for input(s): CHOL, HDL, LDLCALC, TRIG, CHOLHDL, LDLDIRECT in the last 72 hours.  Lab Results  Component Value Date   HGBA1C 6.0 (H) 10/21/2020   ------------------------------------------------------------------------------------------------------------------ No results for input(s): TSH, T4TOTAL, T3FREE, THYROIDAB in the last 72 hours.  Invalid input(s): FREET3 ------------------------------------------------------------------------------------------------------------------ No results for input(s): VITAMINB12, FOLATE, FERRITIN, TIBC, IRON, RETICCTPCT in the last 72 hours.  Coagulation profile No results for input(s): INR, PROTIME in the last 168 hours.  No results for input(s): DDIMER in the last 72 hours.  Cardiac Enzymes No results for input(s): CKMB, TROPONINI, MYOGLOBIN in the  last 168 hours.  Invalid input(s): CK ------------------------------------------------------------------------------------------------------------------    Component Value Date/Time   BNP 1,118.4 (H) 10/20/2020 1702    Micro Results Recent Results (from the past 240 hour(s))  Respiratory Panel by RT PCR (Flu A&B, Covid) - Nasopharyngeal Swab     Status: None   Collection Time: 10/20/20  6:07 PM   Specimen: Nasopharyngeal Swab  Result Value Ref Range Status   SARS Coronavirus 2 by RT PCR NEGATIVE NEGATIVE Final    Comment: (NOTE) SARS-CoV-2 target nucleic acids are NOT DETECTED.  The SARS-CoV-2 RNA is generally detectable in upper respiratoy specimens during the acute phase of infection. The lowest concentration of SARS-CoV-2 viral copies this assay can detect is 131 copies/mL. A negative result does not preclude SARS-Cov-2 infection and should not be used as the sole basis for treatment or other patient management decisions. A negative result may occur with   improper specimen collection/handling, submission of specimen other than nasopharyngeal swab, presence of viral mutation(s) within the areas targeted by this assay, and inadequate number of viral copies (<131 copies/mL). A negative result must be combined with clinical observations, patient history, and epidemiological information. The expected result is Negative.  Fact Sheet for Patients:  PinkCheek.be  Fact Sheet for Healthcare Providers:  GravelBags.it  This test is no t yet approved or cleared by the [place] FDA and  has been authorized for detection and/or diagnosis of SARS-CoV-2 by FDA under an Emergency Use Authorization (EUA). This EUA will remain  in effect (meaning this test can be used) for the duration of the COVID-19 declaration under Section 564(b)(1) of the Act, 21 U.S.C. section 360bbb-3(b)(1), unless the authorization is terminated or revoked sooner.     Influenza A by PCR NEGATIVE NEGATIVE Final   Influenza B by PCR NEGATIVE NEGATIVE Final    Comment: (NOTE) The Xpert Xpress SARS-CoV-2/FLU/RSV assay is intended as an aid in  the diagnosis of influenza from Nasopharyngeal swab specimens and  should not be used as a sole basis for treatment. Nasal washings and  aspirates are unacceptable for Xpert Xpress SARS-CoV-2/FLU/RSV  testing.  Fact Sheet for Patients: PinkCheek.be  Fact Sheet for Healthcare Providers: GravelBags.it  This test is not yet approved or cleared by the [place] FDA and  has been authorized for detection and/or diagnosis of SARS-CoV-2 by  FDA under an Emergency Use Authorization (EUA). This EUA will remain  in effect (meaning this test can be used) for the duration of the  Covid-19 declaration under Section 564(b)(1) of the Act, 21  U.S.C. section 360bbb-3(b)(1), unless the authorization is  terminated or  revoked. Performed at [facility], [place] [address] [place], [place] [address]     Radiology Reports CT Angio Chest PE W and/or Wo Contrast  Result Date: 10/20/2020 CLINICAL DATA:  Hemoptysis, coronary artery disease, short of breath, history of small cell lung cancer EXAM: CT ANGIOGRAPHY CHEST WITH CONTRAST TECHNIQUE: Multidetector CT imaging of the chest was performed using the standard protocol during bolus administration of intravenous contrast. Multiplanar CT image reconstructions and MIPs were obtained to evaluate the vascular anatomy. CONTRAST:  72mL OMNIPAQUE IOHEXOL 350 MG/ML SOLN COMPARISON:  05/21/2020, 10/20/2020 FINDINGS: Cardiovascular: This is a technically adequate evaluation of the pulmonary vasculature. No filling defects or pulmonary emboli. Heart is enlarged, but stable. Postsurgical changes are seen from previous bypass surgery. Minimal atherosclerosis of the aortic arch. Mediastinum/Nodes: Right hilar prominence again noted, unchanged and likely representing post therapeutic changes from previous lung cancer  treatment. There is chronic narrowing of the right upper, right middle, and right lower lobe bronchi. No pathologic adenopathy. Thyroid, trachea, and esophagus are unremarkable. Lungs/Pleura: Diffuse emphysema again noted. No acute airspace disease, effusion, or pneumothorax. Right perihilar consolidation consistent with chronic scarring from previous radiation therapy for lung cancer. Upper Abdomen: No acute abnormality. Musculoskeletal: No acute or destructive bony lesions. Reconstructed images demonstrate no additional findings. Review of the MIP images confirms the above findings. IMPRESSION: 1. No evidence of pulmonary embolus. 2. Stable post therapeutic changes at the right hilum from prior radiation therapy for lung cancer. 3. Cardiomegaly. 4. Aortic Atherosclerosis (ICD10-I70.0) and Emphysema (ICD10-J43.9). Electronically Signed   By: Randa Ngo M.D.   On:  10/20/2020 21:44   DG Chest Port 1 View  Result Date: 10/20/2020 CLINICAL DATA:  Shortness of breath EXAM: PORTABLE CHEST 1 VIEW COMPARISON:  May 21, 2020 FINDINGS: The heart size and mediastinal contours are mildly enlarged. Overlying median sternotomy wires are present. A left-sided pacemaker seen with the lead tips in the right ventricle. Again noted is right perihilar scarring and interstitial markings. The visualized skeletal structures are unremarkable. IMPRESSION: No active disease.  Stable mild cardiomegaly. Electronically Signed   By: Prudencio Pair M.D.   On: 10/20/2020 17:36   CUP PACEART REMOTE DEVICE CHECK  Result Date: 10/06/2020 Scheduled remote reviewed. Normal device function.  Next remote 91 days. Kathy Breach, RN, CCDS, [employer]    Time Spent in minutes  30     Desiree Hane M.D on 10/21/2020 at 12:57 PM  To page go to [email or web address] - password [facility]

## 2020-10-21 NOTE — ED Notes (Signed)
RN paged & spoke with Dr. Alcario Drought. Informed him of pt's SOB and wet breaths. MD gave recommendation to call RT for pt evaluation and BiPAP.

## 2020-10-21 NOTE — Consult Note (Signed)
NAME:  Brittany [name], MRN:  [id number], DOB:  Sep 26, 1961, LOS: 0 ADMISSION DATE:  10/20/2020, CONSULTATION DATE: 10/21/2020 REFERRING MD: Dr. Alcario Drought, CHIEF COMPLAINT: Hemoptysis  Brief History   59 year old female presents [facility] with 3-day history of frank mopped assist.  Imaging essentially negative, however, hemoptysis persists.  PCCM consulted for consideration of bronchoscopy.  History of present illness   59 year old female with past medical history as below, which is significant for small cell lung cancer in remission for 6 years, coronary artery disease status post CABG and multiple PCI events, heart failure with reduced ejection fraction 25%, diabetes mellitus, asthma, and paroxysmal atrial fibrillation not on anticoagulation.  She was in her usual state of health until approximately 11/7 when she developed hemoptysis and mild shortness of breath.  She was convinced that this was likely CHF exacerbation as she has experienced many times before.  Symptoms did not improve with increasing home medications, which caused her to present most emergency department 11/10.  She did note a weight gain of 3 pounds in the preceding 24 hours with associated orthopnea.  There was concern for CHF exacerbation immediately upon arrival to the emergency department.  She was administered IV Lasix with minimal urine output or improvement in her symptoms.  Chest x-ray and CT angiogram did not demonstrate any acute illness.  Despite this her dyspnea continued to worsen.  She was not truly hypoxic even on room air, however, work of breathing increased significantly to the point she was placed on BiPAP at which point PCCM was consulted.  Past Medical History  . has a past medical history of Acute systolic congestive heart failure ([place]) (02/03/2018), Allergy, Anxiety, Asthma, DM2 (diabetes mellitus, type 2) ([place]) (10/20/2020), Hypertension, PAF (paroxysmal atrial fibrillation) ([place]), Prophylactic  measure (08/03/14-08/19/14), S/P emergency CABG x 3 (02/03/2018), and Small cell lung cancer ([place]) (03/16/2014).   Significant Hospital Events   11/10 admit  Consults:  PCCM  Procedures:    Significant Diagnostic Tests:  CT angiogram chest 11/10: No evidence of pulmonary embolus, stable post therapeutic changes of the right hilum for prior radiation therapy, and cardiomegaly.  Micro Data:    Antimicrobials:     Interim history/subjective:    Objective   Blood pressure 99/70, pulse 62, temperature 98 F (36.7 C), temperature source Oral, resp. rate 19, height 4\' 11"  (1.499 m), weight 47.6 kg, SpO2 100 %.        Intake/Output Summary (Last 24 hours) at 10/21/2020 2951 Last data filed at 10/21/2020 0218 Gross per 24 hour  Intake 200 ml  Output --  Net 200 ml   Filed Weights   10/20/20 1648  Weight: 47.6 kg    Examination: General: Middle-aged female in mild distress on BiPAP HENT: Normocephalic, atraumatic, PERRL, no JVD Lungs: Rhonchi, increased work of breathing.  Cardiovascular: RRR, no MRG Abdomen: Soft, non-tender, non-distended Extremities: No acute deformity or ROM limitation. No edema.  Neuro: Alert, oriented, non-focal.    Resolved Hospital Problem list     Assessment & Plan:   Hemoptysis: initially my concern is this more-so represented pulmonary edema, but on exam there is no doubt this is frank hemoptysis. CXR and CT unimpressive. The concern now is that there is recurrence of SCLC - Will benefit from bronchoscopy - I will ask day team to swing by early with plans to arrange bronch.  - Cough suppression  Shortness of breath: doubt this is CHF exacerbation. Imaging clear and limited response to lasix, although she does tell  a good story of weight gain and orthopnea. She was not hypoxemic in ED.  - Continue BiPAP PRN for now - OK for admission to PCU under TRH.  - No need to continue steroids.  - continue home diuretic regimen  Chronic HFREF  (LVEF 25% followed by Dr. [name]) HTN CAD s/p CABG DM - per primary  Best practice:  Diet: NPO for bronch Pain/Anxiety/Delirium protocol (if indicated): NA VAP protocol (if indicated): NA DVT prophylaxis: SCD GI prophylaxis: NA Glucose control: per primary Mobility: BR Code Status: Full Family Communication: patient updated Disposition: PCU  Labs   CBC: Recent Labs  Lab 10/20/20 1702 10/20/20 1714  WBC 7.3  --   NEUTROABS 5.3  --   HGB 15.9* 17.7*  18.0*  HCT 50.2* 52.0*  53.0*  MCV 94.2  --   PLT 205  --     Basic Metabolic Panel: Recent Labs  Lab 10/20/20 1702 10/20/20 1714 10/21/20 0251  NA 141 144  142 137  K 3.2* 3.1*  3.1* 4.9  CL 100 101 105  CO2 27  --  23  GLUCOSE 130* 125* 238*  BUN 10 15 17   CREATININE 0.92 0.90 1.03*  CALCIUM 9.8  --  9.6   GFR: Estimated Creatinine Clearance: 39.6 mL/min (A) (by C-G formula based on SCr of 1.03 mg/dL (H)). Recent Labs  Lab 10/20/20 1702  WBC 7.3    Liver Function Tests: Recent Labs  Lab 10/20/20 1702  AST 23  ALT 15  ALKPHOS 90  BILITOT 1.5*  PROT 7.6  ALBUMIN 4.2   No results for input(s): LIPASE, AMYLASE in the last 168 hours. No results for input(s): AMMONIA in the last 168 hours.  ABG    Component Value Date/Time   PHART 7.392 04/19/2018 1857   PCO2ART 35.3 04/19/2018 1857   PO2ART 110.0 (H) 04/19/2018 1857   HCO3 31.4 (H) 10/20/2020 1714   TCO2 28 10/20/2020 1714   TCO2 33 (H) 10/20/2020 1714   ACIDBASEDEF 6.0 (H) 05/04/2018 2029   O2SAT 68.0 10/20/2020 1714     Coagulation Profile: No results for input(s): INR, PROTIME in the last 168 hours.  Cardiac Enzymes: No results for input(s): CKTOTAL, CKMB, CKMBINDEX, TROPONINI in the last 168 hours.  HbA1C: Hgb A1c MFr Bld  Date/Time Value Ref Range Status  04/22/2018 07:34 AM 5.8 (H) 4.8 - 5.6 % Final    Comment:    (NOTE) Pre diabetes:          5.7%-6.4% Diabetes:              >6.4% Glycemic control for   <7.0% adults  with diabetes   02/21/2018 08:42 AM 5.6 4.8 - 5.6 % Final    Comment:    (NOTE) Pre diabetes:          5.7%-6.4% Diabetes:              >6.4% Glycemic control for   <7.0% adults with diabetes     CBG: Recent Labs  Lab 10/21/20 0548  GLUCAP 221*    Review of Systems:   Bolds are positive  Constitutional: weight loss, gain, night sweats, Fevers, chills, fatigue .  HEENT: headaches, Sore throat, sneezing, nasal congestion, post nasal drip, Difficulty swallowing, Tooth/dental problems, visual complaints visual changes, ear ache CV:  chest pain, radiates:,Orthopnea, PND, swelling in lower extremities, dizziness, palpitations, syncope.  GI  heartburn, indigestion, abdominal pain, nausea, vomiting, diarrhea, change in bowel habits, loss of appetite, bloody stools.  Resp:  cough, productive: hemoptysis, dyspnea, chest pain, pleuritic.  Skin: rash or itching or icterus GU: dysuria, change in color of urine, urgency or frequency. flank pain, hematuria  MS: joint pain or swelling. decreased range of motion  Psych: change in mood or affect. depression or anxiety.  Neuro: difficulty with speech, weakness, numbness, ataxia    Past Medical History  She,  has a past medical history of Acute systolic congestive heart failure ([place]) (02/03/2018), Allergy, Anxiety, Asthma, DM2 (diabetes mellitus, type 2) ([place]) (10/20/2020), Hypertension, PAF (paroxysmal atrial fibrillation) ([place]), Prophylactic measure (08/03/14-08/19/14), S/P emergency CABG x 3 (02/03/2018), and Small cell lung cancer ([place]) (03/16/2014).   Surgical History    Past Surgical History:  Procedure Laterality Date  . CARDIAC DEFIBRILLATOR PLACEMENT  08/15/2018   MDT Visia AF MRI VR ICD implanted by Dr Loel Lofty for primary prevention of sudden  . CESAREAN SECTION    . CORONARY ARTERY BYPASS GRAFT N/A 02/03/2018   Procedure: CORONARY ARTERY BYPASS GRAFTING (CABG);  Surgeon: Rexene Alberts, MD;  Location: [place];  Service: Open Heart  Surgery;  Laterality: N/A;  Time 3 using left internal mammary artery and endoscopically harvested right saphenous vein  . CORONARY BALLOON ANGIOPLASTY N/A 02/03/2018   Procedure: CORONARY BALLOON ANGIOPLASTY;  Surgeon: Martinique, Peter M, MD;  Location: [place] [facility];  Service: Cardiovascular;  Laterality: N/A;  . CORONARY STENT INTERVENTION N/A 02/12/2019   Procedure: CORONARY STENT INTERVENTION;  Surgeon: Wellington Hampshire, MD;  Location: [place] [facility];  Service: Cardiovascular;  Laterality: N/A;  . CORONARY/GRAFT ACUTE MI REVASCULARIZATION N/A 02/03/2018   Procedure: Coronary/Graft Acute MI Revascularization;  Surgeon: Martinique, Peter M, MD;  Location: [place] CV LAB;  Service: Cardiovascular;  Laterality: N/A;  . IABP INSERTION N/A 02/03/2018   Procedure: IABP Insertion;  Surgeon: Martinique, Peter M, MD;  Location: [place] [facility];  Service: Cardiovascular;  Laterality: N/A;  . INTRAOPERATIVE TRANSESOPHAGEAL ECHOCARDIOGRAM N/A 02/03/2018   Procedure: INTRAOPERATIVE TRANSESOPHAGEAL ECHOCARDIOGRAM;  Surgeon: Rexene Alberts, MD;  Location: [place];  Service: Open Heart Surgery;  Laterality: N/A;  . LEFT HEART CATH AND CORONARY ANGIOGRAPHY N/A 02/03/2018   Procedure: LEFT HEART CATH AND CORONARY ANGIOGRAPHY;  Surgeon: Martinique, Peter M, MD;  Location: [place] [facility];  Service: Cardiovascular;  Laterality: N/A;  . LEFT HEART CATH AND CORS/GRAFTS ANGIOGRAPHY N/A 02/12/2019   Procedure: LEFT HEART CATH AND CORS/GRAFTS ANGIOGRAPHY;  Surgeon: Wellington Hampshire, MD;  Location: [place] [facility];  Service: Cardiovascular;  Laterality: N/A;  . MEDIASTINOSCOPY N/A 03/11/2014   Procedure: MEDIASTINOSCOPY;  Surgeon: Melrose Nakayama, MD;  Location: [place];  Service: Thoracic;  Laterality: N/A;  . PLACEMENT OF [place] LEFT VENTRICULAR ASSIST DEVICE  02/03/2018   Procedure: PLACEMENT OF [place] LEFT VENTRICULAR ASSIST DEVICE LD;  Surgeon: Rexene Alberts, MD;  Location: [place];  Service: Open Heart  Surgery;;  . REMOVAL OF IMPELLA LEFT VENTRICULAR ASSIST DEVICE N/A 02/08/2018   Procedure: REMOVAL OF [place] LEFT VENTRICULAR ASSIST DEVICE;  Surgeon: Rexene Alberts, MD;  Location: [place];  Service: Open Heart Surgery;  Laterality: N/A;  . RIGHT HEART CATH N/A 02/03/2018   Procedure: RIGHT HEART CATH;  Surgeon: Martinique, Peter M, MD;  Location: [place] [facility];  Service: Cardiovascular;  Laterality: N/A;  . RIGHT HEART CATH N/A 05/09/2018   Procedure: RIGHT HEART CATH;  Surgeon: Jolaine Artist, MD;  Location: [place] [facility];  Service: Cardiovascular;  Laterality: N/A;  . TEE WITHOUT CARDIOVERSION  N/A 02/08/2018   Procedure: TRANSESOPHAGEAL ECHOCARDIOGRAM (TEE);  Surgeon: Rexene Alberts, MD;  Location: [place];  Service: Open Heart Surgery;  Laterality: N/A;  . TUBAL LIGATION    . VIDEO BRONCHOSCOPY WITH ENDOBRONCHIAL ULTRASOUND N/A 03/11/2014   Procedure: VIDEO BRONCHOSCOPY WITH ENDOBRONCHIAL ULTRASOUND;  Surgeon: Melrose Nakayama, MD;  Location: [place];  Service: Thoracic;  Laterality: N/A;     Social History   reports that she quit smoking about 6 years ago. Her smoking use included cigarettes. She has a 20.00 pack-year smoking history. She has never used smokeless tobacco. She reports current alcohol use of about 8.0 standard drinks of alcohol per week. She reports current drug use. Drug: Marijuana.   Family History   Her family history includes Cancer in her maternal grandmother; Diabetes in her paternal grandmother; Heart attack in her mother; Heart disease in her mother; Hypertension in her maternal grandmother and mother. There is no history of Stroke.   Allergies Allergies  Allergen Reactions  . Codeine Nausea And Vomiting     Home Medications  Prior to Admission medications   Medication Sig Start Date End Date Taking? Authorizing Provider  albuterol (VENTOLIN HFA) 108 (90 Base) MCG/ACT inhaler Inhale 2 puffs into the lungs every 6 (six) hours as needed for wheezing or  shortness of breath. 04/16/19  Yes Bensimhon, Shaune Pascal, MD  ALPRAZolam Duanne Moron) 0.25 MG tablet Take 1 tablet (0.25 mg total) by mouth daily as needed for anxiety. 02/24/19  Yes Shirley Friar, PA-C  aspirin 81 MG EC tablet Take 1 tablet (81 mg total) by mouth daily. 07/03/18  Yes Bensimhon, Shaune Pascal, MD  atorvastatin (LIPITOR) 80 MG tablet Take 1 tablet (80 mg total) by mouth daily at 6 PM. 07/03/18  Yes Bensimhon, Shaune Pascal, MD  dapagliflozin propanediol (FARXIGA) 10 MG TABS tablet Take 1 tablet (10 mg total) by mouth daily before breakfast. 06/11/20  Yes Bensimhon, Shaune Pascal, MD  ipratropium-albuterol (DUONEB) 0.5-2.5 (3) MG/3ML SOLN Take 3 mLs by nebulization 2 (two) times daily as needed.   Yes [provider]  ivabradine (CORLANOR) 7.5 MG TABS tablet Take 1 tablet (7.5 mg total) by mouth 2 (two) times daily with a meal. Patient taking differently: Take 11.25 mg by mouth 2 (two) times daily with a meal.  02/19/20  Yes Larey Dresser, MD  nitroGLYCERIN (NITROSTAT) 0.4 MG SL tablet Place 1 tablet (0.4 mg total) under the tongue every 5 (five) minutes as needed for chest pain. 02/14/19  Yes Shirley Friar, PA-C  potassium chloride SA (K-DUR,KLOR-CON) 20 MEQ tablet Take 1 tablet (20 mEq total) by mouth daily. 02/26/19  Yes Shirley Friar, PA-C  spironolactone (ALDACTONE) 25 MG tablet Take 1 tablet (25 mg total) by mouth daily. 04/12/20  Yes Allred, Jeneen Rinks, MD  ticagrelor (BRILINTA) 90 MG TABS tablet Take 1 tablet (90 mg total) by mouth 2 (two) times daily. 05/17/20  Yes Simmons, Brittainy M, PA-C  torsemide (DEMADEX) 20 MG tablet Take 60 mg daily. May take an extra 20 mg PRN for 3lb weightt gain in 24 hrs. 06/18/20  Yes Bensimhon, Shaune Pascal, MD      Georgann Housekeeper, AGACNP-BC [place] for personal pager PCCM on call pager [phone]  10/21/2020 6:53 AM

## 2020-10-21 NOTE — ED Notes (Signed)
Per pulmonology, hold Brilinta until bronchoscopy.

## 2020-10-21 NOTE — Progress Notes (Signed)
RT called to assess patient for increased WOB. RT placed patient's on BIPAP per MD verbal order. Patient tolerating settings well at this time. RT will monitor as needed.

## 2020-10-21 NOTE — ED Notes (Addendum)
Pt wheezing & c/o severe SOB; O2sat 100%

## 2020-10-22 DIAGNOSIS — I480 Paroxysmal atrial fibrillation: Secondary | ICD-10-CM | POA: Diagnosis not present

## 2020-10-22 DIAGNOSIS — R042 Hemoptysis: Secondary | ICD-10-CM | POA: Diagnosis not present

## 2020-10-22 DIAGNOSIS — I5043 Acute on chronic combined systolic (congestive) and diastolic (congestive) heart failure: Secondary | ICD-10-CM | POA: Diagnosis not present

## 2020-10-22 LAB — BASIC METABOLIC PANEL
Anion gap: 15 (ref 5–15)
BUN: 34 mg/dL — ABNORMAL HIGH (ref 6–20)
CO2: 21 mmol/L — ABNORMAL LOW (ref 22–32)
Calcium: 9.9 mg/dL (ref 8.9–10.3)
Chloride: 102 mmol/L (ref 98–111)
Creatinine, Ser: 1.4 mg/dL — ABNORMAL HIGH (ref 0.44–1.00)
GFR, Estimated: 43 mL/min — ABNORMAL LOW (ref >60)
Glucose, Bld: 157 mg/dL — ABNORMAL HIGH (ref 70–99)
Potassium: 4.8 mmol/L (ref 3.5–5.1)
Sodium: 138 mmol/L (ref 135–145)

## 2020-10-22 LAB — CBC WITH DIFFERENTIAL/PLATELET
Abs Immature Granulocytes: 0.05 10*3/uL (ref 0.00–0.07)
Basophils Absolute: 0 10*3/uL (ref 0.0–0.1)
Basophils Relative: 0 %
Eosinophils Absolute: 0 10*3/uL (ref 0.0–0.5)
Eosinophils Relative: 0 %
HCT: 47.2 % — ABNORMAL HIGH (ref 36.0–46.0)
Hemoglobin: 14.8 g/dL (ref 12.0–15.0)
Immature Granulocytes: 0 %
Lymphocytes Relative: 5 %
Lymphs Abs: 0.7 10*3/uL (ref 0.7–4.0)
MCH: 30.3 pg (ref 26.0–34.0)
MCHC: 31.4 g/dL (ref 30.0–36.0)
MCV: 96.5 fL (ref 80.0–100.0)
Monocytes Absolute: 0.5 10*3/uL (ref 0.1–1.0)
Monocytes Relative: 4 %
Neutro Abs: 11.4 10*3/uL — ABNORMAL HIGH (ref 1.7–7.7)
Neutrophils Relative %: 91 %
Platelets: 176 10*3/uL (ref 150–400)
RBC: 4.89 MIL/uL (ref 3.87–5.11)
RDW: 20.2 % — ABNORMAL HIGH (ref 11.5–15.5)
WBC: 12.7 10*3/uL — ABNORMAL HIGH (ref 4.0–10.5)
nRBC: 0 % (ref 0.0–0.2)

## 2020-10-22 LAB — GLUCOSE, CAPILLARY
Glucose-Capillary: 142 mg/dL — ABNORMAL HIGH (ref 70–99)
Glucose-Capillary: 143 mg/dL — ABNORMAL HIGH (ref 70–99)
Glucose-Capillary: 160 mg/dL — ABNORMAL HIGH (ref 70–99)
Glucose-Capillary: 180 mg/dL — ABNORMAL HIGH (ref 70–99)
Glucose-Capillary: 192 mg/dL — ABNORMAL HIGH (ref 70–99)
Glucose-Capillary: 215 mg/dL — ABNORMAL HIGH (ref 70–99)

## 2020-10-22 MED ORDER — PREDNISONE 20 MG PO TABS
20.0000 mg | ORAL_TABLET | Freq: Every day | ORAL | Status: AC
  Administered 2020-10-23 – 2020-10-24 (×3): 20 mg via ORAL
  Filled 2020-10-22 (×3): qty 1

## 2020-10-22 MED ORDER — ENSURE SURGERY PO LIQD
237.0000 mL | Freq: Two times a day (BID) | ORAL | Status: DC
  Administered 2020-10-22: 237 mL via ORAL
  Filled 2020-10-22 (×2): qty 237

## 2020-10-22 MED ORDER — ENSURE ENLIVE PO LIQD
237.0000 mL | Freq: Three times a day (TID) | ORAL | Status: DC
  Administered 2020-10-22 – 2020-10-25 (×11): 237 mL via ORAL
  Filled 2020-10-22 (×2): qty 237

## 2020-10-22 MED ORDER — IVABRADINE HCL 7.5 MG PO TABS
7.5000 mg | ORAL_TABLET | Freq: Two times a day (BID) | ORAL | Status: DC
  Administered 2020-10-22 – 2020-10-26 (×9): 7.5 mg via ORAL
  Filled 2020-10-22 (×11): qty 1

## 2020-10-22 NOTE — Progress Notes (Signed)
OT Cancellation Note  Patient Details Name: Brittany [name] MRN: [id number] DOB: 08-11-1961   Cancelled Treatment:    Reason Eval/Treat Not Completed: Fatigue/lethargy limiting ability to participate (politely declined, asking therapy to return this afternoon)  [name] 10/22/2020, 9:53 AM  Nestor Lewandowsky, OTR/L Acute Rehabilitation Services Pager: [phone] Office: [phone]

## 2020-10-22 NOTE — Evaluation (Signed)
Occupational Therapy Evaluation Patient Details Name: Brittany [name] MRN: [id number] DOB: December 23, 1960 Today's Date: 10/22/2020    History of Present Illness Pt is a 59 year old woman admitted on 10/21/20 with hemoptysis,  worsening cough, 3lb weight gain and shortness of breath. Pt required bipap. PMH: CAD, NSTEMI, CABG, COPD, advanced CHF, small cell lung cancer 7 years ago, DM2.    Clinical Impression   Pt was independent in mobility and ADL and living near her sister who assists with transportation and laundry as pt live in a motel currently. Her husband lives and works out of state. Pt presents with mild standing balance deficits, generalized weakness and decreased activity tolerance. She requires up to min guard assist for ADL. Pt is currently dependent on 2L 02. She has supplemental 02 at home, but only uses intermittently. Will follow acutely. Do not anticipate she will need post acute OT.    Follow Up Recommendations  No OT follow up    Equipment Recommendations  None recommended by OT    Recommendations for Other Services       Precautions / Restrictions Precautions Precautions: Fall      Mobility Bed Mobility Overal bed mobility: Independent                  Transfers Overall transfer level: Needs assistance Equipment used: None Transfers: Sit to/from Stand Sit to Stand: Supervision         General transfer comment: for safety    Balance Overall balance assessment: Needs assistance   Sitting balance-Leahy Scale: Normal       Standing balance-Leahy Scale: Fair                             ADL either performed or assessed with clinical judgement   ADL Overall ADL's : Needs assistance/impaired Eating/Feeding: Independent   Grooming: Min guard;Standing   Upper Body Bathing: Set up;Sitting   Lower Body Bathing: Supervison/ safety;Sit to/from stand   Upper Body Dressing : Min guard;Sitting   Lower Body Dressing:  Supervision/safety;Sit to/from stand   Toilet Transfer: Min guard;Ambulation   Toileting- Clothing Manipulation and Hygiene: Supervision/safety;Sit to/from stand       Functional mobility during ADLs: Min guard (furniture walks)       Vision Patient Visual Report: No change from baseline       Perception     Praxis      Pertinent Vitals/Pain Pain Assessment: Faces Faces Pain Scale: Hurts little more Pain Location: chest from coughing Pain Descriptors / Indicators: Sore Pain Intervention(s): Heat applied     Hand Dominance Right   Extremity/Trunk Assessment Upper Extremity Assessment Upper Extremity Assessment: RUE deficits/detail RUE Deficits / Details: residual weakness since her R hand from heart attack   Lower Extremity Assessment Lower Extremity Assessment: Defer to PT evaluation   Cervical / Trunk Assessment Cervical / Trunk Assessment: Normal   Communication Communication Communication: No difficulties   Cognition Arousal/Alertness: Awake/alert Behavior During Therapy: WFL for tasks assessed/performed Overall Cognitive Status: Within Functional Limits for tasks assessed                                     General Comments       Exercises     Shoulder Instructions      Home Living Family/patient expects to be discharged to:: Other (Comment) (motel)  Prior Functioning/Environment Level of Independence: Needs assistance  Gait / Transfers Assistance Needed: walked without a device ADL's / Homemaking Assistance Needed: sister helps with groceries, heavy meal prep, laundry   Comments: husband lives and works in [place]        OT Problem List: Decreased activity tolerance;Impaired balance (sitting and/or standing);Decreased knowledge of use of DME or AE;Pain;Cardiopulmonary status limiting activity      OT Treatment/Interventions: Self-care/ADL training;Energy  conservation;DME and/or AE instruction;Patient/family education;Balance training;Therapeutic activities    OT Goals(Current goals can be found in the care plan section) Acute Rehab OT Goals Patient Stated Goal: return home OT Goal Formulation: With patient Time For Goal Achievement: 11/05/20 Potential to Achieve Goals: Good ADL Goals Additional ADL Goal #1: Pt will perform basic ADL modified independently. Additional ADL Goal #2: Pt will generalize energy conservation strategies in ADL.  OT Frequency: Min 2X/week   Barriers to D/C:            Co-evaluation              AM-PAC OT "6 Clicks" Daily Activity     Outcome Measure Help from another person eating meals?: None Help from another person taking care of personal grooming?: A Little Help from another person toileting, which includes using toliet, bedpan, or urinal?: A Little Help from another person bathing (including washing, rinsing, drying)?: A Little Help from another person to put on and taking off regular upper body clothing?: None Help from another person to put on and taking off regular lower body clothing?: A Little 6 Click Score: 20   End of Session Equipment Utilized During Treatment: Oxygen (2L)  Activity Tolerance: Patient tolerated treatment well Patient left: in bed;with call bell/phone within reach  OT Visit Diagnosis: Other abnormalities of gait and mobility (R26.89);Pain;Muscle weakness (generalized) (M62.81)                Time: 6659-9357 OT Time Calculation (min): 17 min Charges:  OT General Charges $OT Visit: 1 Visit OT Evaluation $OT Eval Low Complexity: [place], OTR/L Acute Rehabilitation Services Pager: [phone] Office: [phone]  [name] 10/22/2020, 2:54 PM

## 2020-10-22 NOTE — Progress Notes (Signed)
[facility] PCCM  S: Required noninvasive mechanical ventilatory support x3 hours. Reports one episode of minimal hemoptysis Reports feeling full IN heart failure  O: Vitals:   10/22/20 0500 10/22/20 0546 10/22/20 0751 10/22/20 0828  BP:   111/77   Pulse:    85  Resp:    12  Temp:   98.2 F (36.8 C)   TempSrc:      SpO2: 100%   100%  Weight:  47.4 kg    Height:       2L [place]  General: Ill-appearing female no acute distress HEENT: Mild JVD Neuro: Grossly intact CV: Heart sounds are regular PULM: Mild rhonchi bilaterally GI: soft, bsx4 active  Extremities: warm/dry,  edema  Skin: no rashes or lesions   CBC    Component Value Date/Time   WBC 12.7 (H) 10/22/2020 0435   RBC 4.89 10/22/2020 0435   HGB 14.8 10/22/2020 0435   HGB 15.0 01/13/2020 1406   HGB 9.1 (L) 03/14/2018 1619   HGB 17.6 (H) 12/23/2014 0829   HCT 47.2 (H) 10/22/2020 0435   HCT 28.7 (L) 03/14/2018 1619   HCT 53.9 (H) 12/23/2014 0829   PLT 176 10/22/2020 0435   PLT 237 01/13/2020 1406   PLT 402 (H) 03/14/2018 1619   MCV 96.5 10/22/2020 0435   MCV 90 03/14/2018 1619   MCV 102.1 (H) 12/23/2014 0829   MCH 30.3 10/22/2020 0435   MCHC 31.4 10/22/2020 0435   RDW 20.2 (H) 10/22/2020 0435   RDW 16.9 (H) 03/14/2018 1619   RDW 20.9 (H) 12/23/2014 0829   LYMPHSABS 0.7 10/22/2020 0435   LYMPHSABS 0.7 (L) 12/23/2014 0829   MONOABS 0.5 10/22/2020 0435   MONOABS 0.4 12/23/2014 0829   EOSABS 0.0 10/22/2020 0435   EOSABS 0.2 12/23/2014 0829   BASOSABS 0.0 10/22/2020 0435   BASOSABS 0.0 12/23/2014 0829    Impression: Hemoptysis Asthma exacerbation Advanced heart failure CAD s/p PCI on ticagrelor  Discussion: Plan for fiberoptic bronchoscopy 10/25/2020  Plan: [facility] primary Hold anticoagulants and antiplatelets Continue steroids Bronchodilators Needs outpatient PFTs Anticipate fiberoptic bronchoscopy 10/25/2020 Note bleeding has improved  Richardson Landry Reyann Troop ACNP Acute Care Nurse Practitioner [place] Please consult Amion 10/22/2020, 10:58 AM

## 2020-10-22 NOTE — H&P (View-Only) (Signed)
[facility] PCCM  S: Required noninvasive mechanical ventilatory support x3 hours. Reports one episode of minimal hemoptysis Reports feeling full IN heart failure  O: Vitals:   10/22/20 0500 10/22/20 0546 10/22/20 0751 10/22/20 0828  BP:   111/77   Pulse:    85  Resp:    12  Temp:   98.2 F (36.8 C)   TempSrc:      SpO2: 100%   100%  Weight:  47.4 kg    Height:       2L [place]  General: Ill-appearing female no acute distress HEENT: Mild JVD Neuro: Grossly intact CV: Heart sounds are regular PULM: Mild rhonchi bilaterally GI: soft, bsx4 active  Extremities: warm/dry,  edema  Skin: no rashes or lesions   CBC    Component Value Date/Time   WBC 12.7 (H) 10/22/2020 0435   RBC 4.89 10/22/2020 0435   HGB 14.8 10/22/2020 0435   HGB 15.0 01/13/2020 1406   HGB 9.1 (L) 03/14/2018 1619   HGB 17.6 (H) 12/23/2014 0829   HCT 47.2 (H) 10/22/2020 0435   HCT 28.7 (L) 03/14/2018 1619   HCT 53.9 (H) 12/23/2014 0829   PLT 176 10/22/2020 0435   PLT 237 01/13/2020 1406   PLT 402 (H) 03/14/2018 1619   MCV 96.5 10/22/2020 0435   MCV 90 03/14/2018 1619   MCV 102.1 (H) 12/23/2014 0829   MCH 30.3 10/22/2020 0435   MCHC 31.4 10/22/2020 0435   RDW 20.2 (H) 10/22/2020 0435   RDW 16.9 (H) 03/14/2018 1619   RDW 20.9 (H) 12/23/2014 0829   LYMPHSABS 0.7 10/22/2020 0435   LYMPHSABS 0.7 (L) 12/23/2014 0829   MONOABS 0.5 10/22/2020 0435   MONOABS 0.4 12/23/2014 0829   EOSABS 0.0 10/22/2020 0435   EOSABS 0.2 12/23/2014 0829   BASOSABS 0.0 10/22/2020 0435   BASOSABS 0.0 12/23/2014 0829    Impression: Hemoptysis Asthma exacerbation Advanced heart failure CAD s/p PCI on ticagrelor  Discussion: Plan for fiberoptic bronchoscopy 10/25/2020  Plan: [facility] primary Hold anticoagulants and antiplatelets Continue steroids Bronchodilators Needs outpatient PFTs Anticipate fiberoptic bronchoscopy 10/25/2020 Note bleeding has improved  Richardson Landry Nicko Daher ACNP Acute Care Nurse Practitioner [place] Please consult Amion 10/22/2020, 10:58 AM

## 2020-10-22 NOTE — Progress Notes (Signed)
Initial Nutrition Assessment  DOCUMENTATION CODES:   Not applicable  INTERVENTION:  Provide Ensure Enlive po TID, each supplement provides 350 kcal and 20 grams of protein.  Encourage adequate PO intake.   NUTRITION DIAGNOSIS:   Increased nutrient needs related to chronic illness (COPD, CHF) as evidenced by estimated needs.   GOAL:   Patient will meet greater than or equal to 90% of their needs  MONITOR:   PO intake, Supplement acceptance, Skin, Weight trends, Labs, I & O's  REASON FOR ASSESSMENT:   Consult Assessment of nutrition requirement/status  ASSESSMENT:   59 year old female with medical history significant for not limited to paroxysmal atrial fibrillation, COPD, SCLC treated with chemo, previous medical, chronic systolic CHF presents with cough associated with hemoptysis with large blood clots. Pt with evidence of emphysema on CTA but no PE. Pt with acute hypoxic respiratory failure presumed secondary to combined acute on chronic systolic CHF exacerbation, acute exacerbation of COPD.   Meal completion has been 30-50%. Pt reports appetite has improved since admission. Pt with ~4 days of poor appetite at home. Pt does reports still consuming at least 3 meals a day with 3 Ensure shakes daily. She reports a 9 lb weight loss over the past 1-2 months. Pt motivated to gain weight back. Pt with a reported 7% weight loss from usual body weight. Weight loss and fluctuation may be related to fluid status. RD to order nutritional supplementation to aid in caloric and protein needs. Pt encouraged to eat her food at meals and to drink her supplements.   NUTRITION - FOCUSED PHYSICAL EXAM:    Most Recent Value  Orbital Region Unable to assess  Upper Arm Region Mild depletion  Thoracic and Lumbar Region No depletion  Buccal Region Unable to assess  Temple Region Unable to assess  Clavicle Bone Region No depletion  Clavicle and Acromion Bone Region No depletion  Scapular Bone  Region Unable to assess  Dorsal Hand Unable to assess  Patellar Region No depletion  Anterior Thigh Region No depletion  Posterior Calf Region No depletion  Edema (RD Assessment) Mild  Hair Reviewed  Eyes Reviewed  Mouth Reviewed  Skin Reviewed  Nails Reviewed     Labs and medications reviewed.   Diet Order:   Diet Order            Diet heart healthy/carb modified Room service appropriate? Yes; Fluid consistency: Thin; Fluid restriction: 1200 mL Fluid  Diet effective now                 EDUCATION NEEDS:   Not appropriate for education at this time  Skin:  Skin Assessment: Reviewed RN Assessment  Last BM:  11/11  Height:   Ht Readings from Last 1 Encounters:  10/20/20 4\' 11"  (1.499 m)    Weight:   Wt Readings from Last 1 Encounters:  10/22/20 47.4 kg   BMI:  Body mass index is 21.09 kg/m.  Estimated Nutritional Needs:   Kcal:  1550-1750  Protein:  75-90 grams  Fluid:  1.2 L/day  Corrin Parker, MS, RD, LDN RD pager number/after hours weekend pager number on Am[facility].

## 2020-10-22 NOTE — Evaluation (Signed)
Physical Therapy Evaluation Patient Details Name: Brittany [name] MRN: [id number] DOB: Jun 07, 1961 Today's Date: 10/22/2020   History of Present Illness  Pt is a 59 year old woman admitted on 10/21/20 with hemoptysis,  worsening cough, 3lb weight gain and shortness of breath. Pt required bipap. PMH: CAD, NSTEMI, CABG, COPD, advanced CHF, small cell lung cancer 7 years ago, DM2.   Clinical Impression  Pt presents to PT with slightly unsteady gait due to illness and inactivity. Expect pt will make good progress back to baseline with mobility. Will follow acutely but doubt pt will need PT after DC.      Follow Up Recommendations No PT follow up    Equipment Recommendations  Other (comment) (rollator)    Recommendations for Other Services       Precautions / Restrictions Precautions Precautions: Fall      Mobility  Bed Mobility Overal bed mobility: Independent                  Transfers Overall transfer level: Needs assistance Equipment used: None;4-wheeled walker Transfers: Sit to/from Stand Sit to Stand: Supervision         General transfer comment: for safety  Ambulation/Gait Ambulation/Gait assistance: Supervision Gait Distance (Feet): 200 Feet Assistive device: 4-wheeled walker Gait Pattern/deviations: Step-through pattern;Decreased stride length Gait velocity: decr Gait velocity interpretation: 1.31 - 2.62 ft/sec, indicative of limited community ambulator General Gait Details: supervision for safety  Stairs            Wheelchair Mobility    Modified Rankin (Stroke Patients Only)       Balance Overall balance assessment: Needs assistance Sitting-balance support: No upper extremity supported;Feet supported Sitting balance-Leahy Scale: Normal     Standing balance support: No upper extremity supported;During functional activity Standing balance-Leahy Scale: Fair                               Pertinent Vitals/Pain  Pain Assessment: No/denies pain Faces Pain Scale: Hurts little more Pain Location: chest from coughing Pain Descriptors / Indicators: Sore Pain Intervention(s): Heat applied    Home Living Family/patient expects to be discharged to:: Other (Comment) [employer])                      Prior Function Level of Independence: Needs assistance   Gait / Transfers Assistance Needed: walked without a device  ADL's / Homemaking Assistance Needed: sister helps with groceries, heavy meal prep, laundry  Comments: husband lives and works in [employer]   Dominant Hand: Right    Extremity/Trunk Assessment   Upper Extremity Assessment Upper Extremity Assessment: Defer to OT evaluation RUE Deficits / Details: residual weakness since her R hand from heart attack    Lower Extremity Assessment Lower Extremity Assessment: Generalized weakness    Cervical / Trunk Assessment Cervical / Trunk Assessment: Normal  Communication   Communication: No difficulties  Cognition Arousal/Alertness: Awake/alert Behavior During Therapy: WFL for tasks assessed/performed Overall Cognitive Status: Within Functional Limits for tasks assessed                                        General Comments General comments (skin integrity, edema, etc.): Amb on RA with SpO2 >92%.     Exercises     Assessment/Plan    PT Assessment Patient needs  continued PT services  PT Problem List Decreased strength;Decreased activity tolerance;Decreased balance;Decreased mobility       PT Treatment Interventions DME instruction;Gait training;Functional mobility training;Therapeutic activities;Therapeutic exercise;Balance training;Patient/family education    PT Goals (Current goals can be found in the Care Plan section)  Acute Rehab PT Goals Patient Stated Goal: return home PT Goal Formulation: With patient Time For Goal Achievement: 10/29/20 Potential to Achieve Goals: Good     Frequency Min 3X/week   Barriers to discharge        Co-evaluation               AM-PAC PT "6 Clicks" Mobility  Outcome Measure Help needed turning from your back to your side while in a flat bed without using bedrails?: None Help needed moving from lying on your back to sitting on the side of a flat bed without using bedrails?: None Help needed moving to and from a bed to a chair (including a wheelchair)?: A Little Help needed standing up from a chair using your arms (e.g., wheelchair or bedside chair)?: A Little Help needed to walk in hospital room?: A Little Help needed climbing 3-5 steps with a railing? : A Little 6 Click Score: 20    End of Session   Activity Tolerance: Patient tolerated treatment well Patient left: in bed;with call bell/phone within reach   PT Visit Diagnosis: Muscle weakness (generalized) (M62.81);Other abnormalities of gait and mobility (R26.89)    Time: 1884-1660 PT Time Calculation (min) (ACUTE ONLY): 18 min   Charges:   PT Evaluation $PT Eval Moderate Complexity: [place] Pager [phone] Office [place] 10/22/2020, 4:30 PM

## 2020-10-22 NOTE — TOC Initial Note (Signed)
Transition of Care [facility]) - Initial/Assessment Note    Patient Details  Name: Brittany [name] MRN: [id number] Date of Birth: 09/30/1961  [facility] CM/SW Contact:    [address], [place] Phone Number: 10/22/2020, 4:39 PM  Clinical Narrative:                 Per PT/OT no follow-up recommended.         Patient Goals and CMS Choice        Expected Discharge Plan and Services                                                Prior Living Arrangements/Services                       Activities of Daily Living      Permission Sought/Granted                  Emotional Assessment              Admission diagnosis:  COPD exacerbation ([place]) [J44.1] COPD with acute exacerbation ([place]) [J44.1] Hemoptysis [R04.2] Congestive heart failure, unspecified HF chronicity, unspecified heart failure type ([place]) [I50.9] Patient Active Problem List   Diagnosis Date Noted  . COPD with acute exacerbation ([place]) 10/21/2020  . Hemoptysis 10/20/2020  . DM2 (diabetes mellitus, type 2) ([place]) 10/20/2020  . Acute on chronic systolic and diastolic heart failure, NYHA class 3 ([place]) 05/21/2020  . Ischemic cardiomyopathy 04/12/2020  . 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 NSTEMI (non-ST elevated myocardial infarction) ([place]) 02/12/2019  . CHF (congestive heart failure) ([place]) 11/24/2018  . Recurrent pleural effusion on right 05/05/2018  . Acute respiratory failure ([place]) 05/05/2018  . Dyspnea 04/19/2018  . Chronic respiratory failure with hypoxia ([place])   . Acute on chronic systolic CHF (congestive heart failure) ([place]) 02/03/2018  . S/P CABG (coronary artery bypass graft) 02/03/2018  . Asthma   . Anxiety   . Allergy   . HCAP (healthcare-associated pneumonia) 03/15/2015  . Chest pain 03/15/2015  . Small cell lung cancer ([place]) 03/16/2014  . Protein-calorie malnutrition, severe ([place]) 03/14/2014  . Atrial fibrillation  ([place]) 03/12/2014  . HTN (hypertension) 05/07/2012   PCP:  Maude Leriche, PA-C Pharmacy:   [id number] - [place], [address] [place] [address] Phone: [phone] Fax: [phone]  [facility] [place] [place] - [address] [place] [address] Phone: [phone] Fax: [phone]     Social Determinants of Health (SDOH) Interventions    Readmission Risk Interventions No flowsheet data found.

## 2020-10-22 NOTE — Plan of Care (Signed)

## 2020-10-22 NOTE — Therapy (Signed)
Pt refused to wear bipap tonight unit at bedside

## 2020-10-22 NOTE — Progress Notes (Signed)
[facility]  PROGRESS NOTE  Jaianna Nicoll VOH:[id number] DOB: 03-Jul-1961 DOA: 10/20/2020 PCP: Maude Leriche, PA-C Admit date - 10/20/2020   Admitting Physician Desiree Hane, MD  Outpatient Primary MD for the patient is Scifres, Durel Salts  LOS - 1 Brief Narrative  [name] is a 59 year old female with medical history significant for not limited to paroxysmal atrial fibrillation, COPD, SCLC treated with chemo, previous medical, chronic systolic CHF who presents with 2 days of worsening cough associated with hemoptysis with large blood clots, and new need for supplemental O2 at 2 L nasal cannula at home as well as 3 pound weight gain.  She was found to have evidence of emphysema on CTA but no PE, elevated BNP patient was admitted with working diagnosis of acute hypoxic respiratory failure presumed secondary to combined acute on chronic systolic CHF exacerbation, acute exacerbation of COPD.  Hospital course complicated significant increased work of breathing requiring BiPAP   Subjective  Today feels breathing is much better with steroids, antibiotic and breathing treatments. Still coughing, not as much blood coming up. No CP. Some right sided rib cage pain worse with cough  A & P    Acute COPD exacerbation,improving. Much less wheezing, on 2 L now down from 3L and hasn't required BiPAP CHF exacerbation likely also contributed.   typically at home does not require oxygen  -Appreciate heart failure recommendations to continue IV Lasix twice daily closely monitor urine output --Continue CHF home regimen --Appreciate PCCM recommendations, will continue scheduled duo nebs, doxycycline, IV Solu-Medrol, flutter valve, incentive spirometry  Acute on chronic systolic CHF with history of ischemic cardiomyopathy.  TTE on 11/11 with EF 20-25%, and global hypokinesis  Elevated BNP on arrival, complains of orthopnea, PND with worsening cough and dyspnea on O2  requirements. Urine output of 1.5 L -Appreciate CHF team recommendations, hold IV Lasix given AKI, restart oral regien on 11/14 -Daily output monitoring, strict I's and O's -Continue ivabradine, spironolactone, Farxiga  Hemoptysis.  Could be related to acute exacerbation of COPD.  CTA showed stable scarring in the right hilum consistent with emphysema.  Patient does have history of NSCLC some concern -Continue COPD exacerbation regimen -PCCM following, likely bronchoscopy on 11/15, awaiting ticagrelor washout.  Per cardiology does not need to continue Brilinta in the future -Aspirin discontinued by cardiology  Hyperlipidemia, stable on -Home Lipitor     Family Communication  : None  Code Status : Full  Disposition Plan  :  Patient is from home. Anticipated d/c date: 2 to 3 days. Barriers to d/c or necessity for inpatient status:  close monitoring of O2 status, , eventually bronchoscopy.  Consults  : Cardiology, PCCM  Procedures  : TTE   DVT Prophylaxis  : SCDs  MDM: The below labs and imaging reports were reviewed and summarized above.  Medication management as above.  Lab Results  Component Value Date   PLT 176 10/22/2020    Diet :  Diet Order            Diet heart healthy/carb modified Room service appropriate? Yes; Fluid consistency: Thin; Fluid restriction: 1200 mL Fluid  Diet effective now                  Inpatient Medications Scheduled Meds: . atorvastatin  80 mg Oral q1800  . doxycycline  100 mg Oral Q12H  . feeding supplement  237 mL Oral BID BM  . insulin aspart  0-9 Units Subcutaneous Q4H  . ipratropium-albuterol  3  mL Nebulization Q6H  . ivabradine  7.5 mg Oral BID WC  . methylPREDNISolone (SOLU-MEDROL) injection  40 mg Intravenous Q12H  . sodium chloride flush  3 mL Intravenous Q12H  . spironolactone  25 mg Oral Daily   Continuous Infusions: . sodium chloride     PRN Meds:.sodium chloride, acetaminophen, albuterol, ALPRAZolam, ondansetron  (ZOFRAN) IV, sodium chloride flush  Antibiotics  :   Anti-infectives (From admission, onward)   Start     Dose/Rate Route Frequency Ordered Stop   10/21/20 1315  doxycycline (VIBRA-TABS) tablet 100 mg        100 mg Oral Every 12 hours 10/21/20 1308 10/26/20 0959       Objective   Vitals:   10/22/20 0546 10/22/20 0751 10/22/20 0828 10/22/20 1200  BP:  111/77    Pulse:   85   Resp:   12   Temp:  98.2 F (36.8 C)  98 F (36.7 C)  TempSrc:    Oral  SpO2:   100%   Weight: 47.4 kg     Height:        SpO2: 100 % O2 Flow Rate (L/min): 2 L/min FiO2 (%): 32 %  Wt Readings from Last 3 Encounters:  10/22/20 47.4 kg  06/11/20 50.4 kg  05/24/20 49.8 kg     Intake/Output Summary (Last 24 hours) at 10/22/2020 1336 Last data filed at 10/22/2020 0836 Gross per 24 hour  Intake 610 ml  Output 1525 ml  Net -915 ml    Physical Exam:    Thin female, in no acute distress Awake Alert, Oriented X 3, Normal affect No new F.N deficits,  [place].AT, Normal respiratory effort on 2L,faint rhonchi, minimal crackles.  RRR,No Gallops,Rubs or new Murmurs, no edema +ve B.Sounds, Abd Soft, No tenderness, No rebound, guarding or rigidity. No Cyanosis, No new Rash or bruise    I have personally reviewed the following:   Data Reviewed:  CBC Recent Labs  Lab 10/20/20 1702 10/20/20 1714 10/22/20 0435  WBC 7.3  --  12.7*  HGB 15.9* 17.7*  18.0* 14.8  HCT 50.2* 52.0*  53.0* 47.2*  PLT 205  --  176  MCV 94.2  --  96.5  MCH 29.8  --  30.3  MCHC 31.7  --  31.4  RDW 19.9*  --  20.2*  LYMPHSABS 1.2  --  0.7  MONOABS 0.6  --  0.5  EOSABS 0.1  --  0.0  BASOSABS 0.1  --  0.0    Chemistries  Recent Labs  Lab 10/20/20 1702 10/20/20 1714 10/21/20 0251 10/22/20 0435  NA 141 144  142 137 138  K 3.2* 3.1*  3.1* 4.9 4.8  CL 100 101 105 102  CO2 27  --  23 21*  GLUCOSE 130* 125* 238* 157*  BUN 10 15 17  34*  CREATININE 0.92 0.90 1.03* 1.40*  CALCIUM 9.8  --  9.6 9.9  AST 23  --    --   --   ALT 15  --   --   --   ALKPHOS 90  --   --   --   BILITOT 1.5*  --   --   --    ------------------------------------------------------------------------------------------------------------------ No results for input(s): CHOL, HDL, LDLCALC, TRIG, CHOLHDL, LDLDIRECT in the last 72 hours.  Lab Results  Component Value Date   HGBA1C 6.0 (H) 10/21/2020   ------------------------------------------------------------------------------------------------------------------ No results for input(s): TSH, T4TOTAL, T3FREE, THYROIDAB in the last 72 hours.  Invalid input(s): FREET3 ------------------------------------------------------------------------------------------------------------------  No results for input(s): VITAMINB12, FOLATE, FERRITIN, TIBC, IRON, RETICCTPCT in the last 72 hours.  Coagulation profile No results for input(s): INR, PROTIME in the last 168 hours.  No results for input(s): DDIMER in the last 72 hours.  Cardiac Enzymes No results for input(s): CKMB, TROPONINI, MYOGLOBIN in the last 168 hours.  Invalid input(s): CK ------------------------------------------------------------------------------------------------------------------    Component Value Date/Time   BNP 1,118.4 (H) 10/20/2020 1702    Micro Results Recent Results (from the past 240 hour(s))  Respiratory Panel by RT PCR (Flu A&B, Covid) - Nasopharyngeal Swab     Status: None   Collection Time: 10/20/20  6:07 PM   Specimen: Nasopharyngeal Swab  Result Value Ref Range Status   SARS Coronavirus 2 by RT PCR NEGATIVE NEGATIVE Final    Comment: (NOTE) SARS-CoV-2 target nucleic acids are NOT DETECTED.  The SARS-CoV-2 RNA is generally detectable in upper respiratoy specimens during the acute phase of infection. The lowest concentration of SARS-CoV-2 viral copies this assay can detect is 131 copies/mL. A negative result does not preclude SARS-Cov-2 infection and should not be used as the sole basis for  treatment or other patient management decisions. A negative result may occur with  improper specimen collection/handling, submission of specimen other than nasopharyngeal swab, presence of viral mutation(s) within the areas targeted by this assay, and inadequate number of viral copies (<131 copies/mL). A negative result must be combined with clinical observations, patient history, and epidemiological information. The expected result is Negative.  Fact Sheet for Patients:  PinkCheek.be  Fact Sheet for Healthcare Providers:  GravelBags.it  This test is no t yet approved or cleared by the [place] FDA and  has been authorized for detection and/or diagnosis of SARS-CoV-2 by FDA under an Emergency Use Authorization (EUA). This EUA will remain  in effect (meaning this test can be used) for the duration of the COVID-19 declaration under Section 564(b)(1) of the Act, 21 U.S.C. section 360bbb-3(b)(1), unless the authorization is terminated or revoked sooner.     Influenza A by PCR NEGATIVE NEGATIVE Final   Influenza B by PCR NEGATIVE NEGATIVE Final    Comment: (NOTE) The Xpert Xpress SARS-CoV-2/FLU/RSV assay is intended as an aid in  the diagnosis of influenza from Nasopharyngeal swab specimens and  should not be used as a sole basis for treatment. Nasal washings and  aspirates are unacceptable for Xpert Xpress SARS-CoV-2/FLU/RSV  testing.  Fact Sheet for Patients: PinkCheek.be  Fact Sheet for Healthcare Providers: GravelBags.it  This test is not yet approved or cleared by the [place] FDA and  has been authorized for detection and/or diagnosis of SARS-CoV-2 by  FDA under an Emergency Use Authorization (EUA). This EUA will remain  in effect (meaning this test can be used) for the duration of the  Covid-19 declaration under Section 564(b)(1) of the Act, 21    U.S.C. section 360bbb-3(b)(1), unless the authorization is  terminated or revoked. Performed at [facility], [place] [address] [place], [place] [address]     Radiology Reports CT Angio Chest PE W and/or Wo Contrast  Result Date: 10/20/2020 CLINICAL DATA:  Hemoptysis, coronary artery disease, short of breath, history of small cell lung cancer EXAM: CT ANGIOGRAPHY CHEST WITH CONTRAST TECHNIQUE: Multidetector CT imaging of the chest was performed using the standard protocol during bolus administration of intravenous contrast. Multiplanar CT image reconstructions and MIPs were obtained to evaluate the vascular anatomy. CONTRAST:  87mL OMNIPAQUE IOHEXOL 350 MG/ML SOLN COMPARISON:  05/21/2020, 10/20/2020 FINDINGS:  Cardiovascular: This is a technically adequate evaluation of the pulmonary vasculature. No filling defects or pulmonary emboli. Heart is enlarged, but stable. Postsurgical changes are seen from previous bypass surgery. Minimal atherosclerosis of the aortic arch. Mediastinum/Nodes: Right hilar prominence again noted, unchanged and likely representing post therapeutic changes from previous lung cancer treatment. There is chronic narrowing of the right upper, right middle, and right lower lobe bronchi. No pathologic adenopathy. Thyroid, trachea, and esophagus are unremarkable. Lungs/Pleura: Diffuse emphysema again noted. No acute airspace disease, effusion, or pneumothorax. Right perihilar consolidation consistent with chronic scarring from previous radiation therapy for lung cancer. Upper Abdomen: No acute abnormality. Musculoskeletal: No acute or destructive bony lesions. Reconstructed images demonstrate no additional findings. Review of the MIP images confirms the above findings. IMPRESSION: 1. No evidence of pulmonary embolus. 2. Stable post therapeutic changes at the right hilum from prior radiation therapy for lung cancer. 3. Cardiomegaly. 4. Aortic Atherosclerosis (ICD10-I70.0) and  Emphysema (ICD10-J43.9). Electronically Signed   By: Randa Ngo M.D.   On: 10/20/2020 21:44   DG Chest Port 1 View  Result Date: 10/20/2020 CLINICAL DATA:  Shortness of breath EXAM: PORTABLE CHEST 1 VIEW COMPARISON:  May 21, 2020 FINDINGS: The heart size and mediastinal contours are mildly enlarged. Overlying median sternotomy wires are present. A left-sided pacemaker seen with the lead tips in the right ventricle. Again noted is right perihilar scarring and interstitial markings. The visualized skeletal structures are unremarkable. IMPRESSION: No active disease.  Stable mild cardiomegaly. Electronically Signed   By: Prudencio Pair M.D.   On: 10/20/2020 17:36   ECHOCARDIOGRAM COMPLETE  Result Date: 10/21/2020    ECHOCARDIOGRAM REPORT   Patient Name:   SRUTI AYLLON Date of Exam: 10/21/2020 Medical Rec #:  [id number]               Height:       59.0 in Accession #:    [id number]              Weight:       105.0 lb Date of Birth:  February 21, 1961               BSA:          1.403 m Patient Age:    58 years                BP:           114/67 mmHg Patient Gender: F                       HR:           70 bpm. Exam Location:  Inpatient Procedure: 2D Echo, Cardiac Doppler and Color Doppler Indications:    I50.23 Acute on chronic systolic (congestive) heart failure  History:        Patient has prior history of Echocardiogram examinations, most                 recent 02/12/2019. Prior CABG, Arrythmias:Atrial Fibrillation;                 Risk Factors:Hypertension and Diabetes. Cancer.  Sonographer:    Jonelle Sidle Dance Referring Phys: [place]  1. Left ventricular ejection fraction, by estimation, is 20 to 25%. The left ventricle has severely decreased function. The left ventricle demonstrates global hypokinesis. The left ventricular internal cavity size was mildly dilated. Left ventricular diastolic parameters are consistent with Grade II diastolic dysfunction (pseudonormalization).  2.  Right ventricular systolic function is mildly reduced. The right ventricular size is normal. There is normal pulmonary artery systolic pressure.  3. Left atrial size was mild to moderately dilated.  4. The mitral valve is normal in structure. Trivial mitral valve regurgitation. No evidence of mitral stenosis.  5. The aortic valve is tricuspid. Aortic valve regurgitation is moderate. PHT 268 msec. Mild aortic valve sclerosis is present, with no evidence of aortic valve stenosis.  6. Possible PFO versus small ASD with left to right shunting noted on apical images.  7. The inferior vena cava is normal in size with greater than 50% respiratory variability, suggesting right atrial pressure of 3 mmHg. FINDINGS  Left Ventricle: Left ventricular ejection fraction, by estimation, is 20 to 25%. The left ventricle has severely decreased function. The left ventricle demonstrates global hypokinesis. The left ventricular internal cavity size was mildly dilated. There is no left ventricular hypertrophy. Left ventricular diastolic parameters are consistent with Grade II diastolic dysfunction (pseudonormalization). Right Ventricle: The right ventricular size is normal. No increase in right ventricular wall thickness. Right ventricular systolic function is mildly reduced. There is normal pulmonary artery systolic pressure. The tricuspid regurgitant velocity is 2.61 m/s, and with an assumed right atrial pressure of 3 mmHg, the estimated right ventricular systolic pressure is 41.7 mmHg. Left Atrium: Left atrial size was mild to moderately dilated. Right Atrium: Right atrial size was normal in size. Pericardium: There is no evidence of pericardial effusion. Mitral Valve: The mitral valve is normal in structure. Mild mitral annular calcification. Trivial mitral valve regurgitation. No evidence of mitral valve stenosis. Tricuspid Valve: The tricuspid valve is normal in structure. Tricuspid valve regurgitation is trivial. Aortic Valve: The  aortic valve is tricuspid. Aortic valve regurgitation is moderate. Aortic regurgitation PHT measures 292 msec. Mild aortic valve sclerosis is present, with no evidence of aortic valve stenosis. Pulmonic Valve: The pulmonic valve was normal in structure. Pulmonic valve regurgitation is not visualized. Aorta: The aortic root is normal in size and structure. Venous: The inferior vena cava is normal in size with greater than 50% respiratory variability, suggesting right atrial pressure of 3 mmHg. IAS/Shunts: PFO versus small ASD with left to right shunting. Additional Comments: A pacer wire is visualized in the right ventricle.  LEFT VENTRICLE PLAX 2D LVIDd:         5.80 cm  Diastology LVIDs:         4.70 cm  LV e' medial:    2.54 cm/s LV PW:         1.30 cm  LV E/e' medial:  27.3 LV IVS:        0.60 cm  LV e' lateral:   5.63 cm/s LVOT diam:     2.10 cm  LV E/e' lateral: 12.3 LV SV:         69 LV SV Index:   49 LVOT Area:     3.46 cm  RIGHT VENTRICLE            IVC RV Basal diam:  2.70 cm    IVC diam: 1.30 cm RV S prime:     6.53 cm/s TAPSE (M-mode): 1.7 cm LEFT ATRIUM             Index       RIGHT ATRIUM          Index LA diam:        4.40 cm 3.14 cm/m  RA Area:     7.67 cm LA  Vol [facility]):   56.2 ml 40.07 ml/m RA Volume:   13.90 ml 9.91 ml/m LA Vol (A4C):   43.1 ml 30.73 ml/m LA Biplane Vol: 49.3 ml 35.15 ml/m  AORTIC VALVE LVOT Vmax:   108.00 cm/s LVOT Vmean:  65.800 cm/s LVOT VTI:    0.200 m AI PHT:      292 msec  AORTA Ao Root diam: 2.90 cm Ao Asc diam:  3.00 cm MITRAL VALVE               TRICUSPID VALVE MV Area (PHT): 2.66 cm    TR Peak grad:   27.2 mmHg MV Decel Time: 285 msec    TR Vmax:        261.00 cm/s MV E velocity: 69.40 cm/s MV A velocity: 47.50 cm/s  SHUNTS MV E/A ratio:  1.46        Systemic VTI:  0.20 m                            Systemic Diam: 2.10 cm Loralie Champagne MD Electronically signed by Loralie Champagne MD Signature Date/Time: 10/21/2020/4:37:07 PM    Final    CUP PACEART REMOTE DEVICE  CHECK  Result Date: 10/06/2020 Scheduled remote reviewed. Normal device function.  Next remote 91 days. Kathy Breach, RN, CCDS, [employer]    Time Spent in minutes  30     Desiree Hane M.D on 10/22/2020 at 1:36 PM  To page go to [email or web address] - password [facility]

## 2020-10-22 NOTE — Progress Notes (Addendum)
Advanced Heart Failure Rounding Note  PCP-Cardiologist: No primary care provider on file.   Subjective:     Admitted Oct 21, 2020 for hemoptysis under Hospitalist services and CCM consulted with plan to bronch after washout from [place].    Today: Reports feeling much better with breathing treatments and steroids.  Did require cpap overnight.  Denies any coughing or hemoptysis today.    Objective:   Weight Range: 47.4 kg Body mass index is 21.09 kg/m.   Vital Signs:   Temp:  [98.1 F (36.7 C)-98.5 F (36.9 C)] 98.2 F (36.8 C) (11/12 0751) Pulse Rate:  [55-85] 85 (11/12 0828) Resp:  [12-28] 12 (11/12 0828) BP: (92-125)/(46-98) 111/77 (11/12 0751) SpO2:  [97 %-100 %] 100 % (11/12 0828) FiO2 (%):  [32 %] 32 % (11/11 2257) Weight:  [47.4 kg-49.5 kg] 47.4 kg (11/12 0546) Last BM Date: 10/21/20  Weight change: Filed Weights   10/20/20 1648 10/22/20 0356 10/22/20 0546  Weight: 47.6 kg 49.5 kg 47.4 kg    Intake/Output:   Intake/Output Summary (Last 24 hours) at 10/22/2020 0833 Last data filed at 10/22/2020 0545 Gross per 24 hour  Intake 390 ml  Output 1525 ml  Net -1135 ml      Physical Exam    General:  Well appearing. No resp difficulty wearing n/c.  HEENT: normal Neck: supple. +HJR. Cor: PMI nondisplaced. Regular rate & rhythm. No rubs, gallops or murmurs. Lungs: clear bilaterally (s/p neb).  Abdomen: soft, nontender, nondistended. No hepatosplenomegaly. No bruits or masses. Good bowel sounds. Extremities: no cyanosis, clubbing, rash, edema Neuro: alert & oriented x 3, cranial nerves grossly intact. moves all 4 extremities w/o difficulty. Affect pleasant    Telemetry   SR w/ PVC rates 80-90s.  Personally reviewed.   EKG    SR on EKG 10/21/2020.   Labs    CBC Recent Labs    10/20/20 1702 10/20/20 1702 10/20/20 1714 10/22/20 0435  WBC 7.3  --   --  12.7*  NEUTROABS 5.3  --   --  11.4*  HGB 15.9*   < > 17.7*  18.0* 14.8  HCT 50.2*   <  > 52.0*  53.0* 47.2*  MCV 94.2  --   --  96.5  PLT 205  --   --  176   < > = values in this interval not displayed.   Basic Metabolic Panel Recent Labs    10/21/20 0251 10/22/20 0435  NA 137 138  K 4.9 4.8  CL 105 102  CO2 23 21*  GLUCOSE 238* 157*  BUN 17 34*  CREATININE 1.03* 1.40*  CALCIUM 9.6 9.9   Liver Function Tests Recent Labs    10/20/20 1702  AST 23  ALT 15  ALKPHOS 90  BILITOT 1.5*  PROT 7.6  ALBUMIN 4.2   No results for input(s): LIPASE, AMYLASE in the last 72 hours. Cardiac Enzymes No results for input(s): CKTOTAL, CKMB, CKMBINDEX, TROPONINI in the last 72 hours.  BNP: BNP (last 3 results) Recent Labs    05/21/20 1102 10/20/20 1702  BNP 896.5* 1,118.4*    ProBNP (last 3 results) No results for input(s): PROBNP in the last 8760 hours.   D-Dimer No results for input(s): DDIMER in the last 72 hours. Hemoglobin A1C Recent Labs    10/21/20 0625  HGBA1C 6.0*   Fasting Lipid Panel No results for input(s): CHOL, HDL, LDLCALC, TRIG, CHOLHDL, LDLDIRECT in the last 72 hours. Thyroid Function Tests No results for input(s):  TSH, T4TOTAL, T3FREE, THYROIDAB in the last 72 hours.  Invalid input(s): FREET3  Other results:   Imaging    ECHOCARDIOGRAM COMPLETE  Result Date: 10/21/2020    ECHOCARDIOGRAM REPORT   Patient Name:   DARCHELLE NUNES Date of Exam: 10/21/2020 Medical Rec #:  [id number]               Height:       59.0 in Accession #:    [id number]              Weight:       105.0 lb Date of Birth:  [birth year]               BSA:          1.403 m Patient Age:    59 years                BP:           114/67 mmHg Patient Gender: F                       HR:           70 bpm. Exam Location:  Inpatient Procedure: 2D Echo, Cardiac Doppler and Color Doppler Indications:    I50.23 Acute on chronic systolic (congestive) heart failure  History:        Patient has prior history of Echocardiogram examinations, most                 recent 02/12/2019.  Prior CABG, Arrythmias:Atrial Fibrillation;                 Risk Factors:Hypertension and Diabetes. Cancer.  Sonographer:    Jonelle Sidle Dance Referring Phys: Gary  1. Left ventricular ejection fraction, by estimation, is 20 to 25%. The left ventricle has severely decreased function. The left ventricle demonstrates global hypokinesis. The left ventricular internal cavity size was mildly dilated. Left ventricular diastolic parameters are consistent with Grade II diastolic dysfunction (pseudonormalization).  2. Right ventricular systolic function is mildly reduced. The right ventricular size is normal. There is normal pulmonary artery systolic pressure.  3. Left atrial size was mild to moderately dilated.  4. The mitral valve is normal in structure. Trivial mitral valve regurgitation. No evidence of mitral stenosis.  5. The aortic valve is tricuspid. Aortic valve regurgitation is moderate. PHT 268 msec. Mild aortic valve sclerosis is present, with no evidence of aortic valve stenosis.  6. Possible PFO versus small ASD with left to right shunting noted on apical images.  7. The inferior vena cava is normal in size with greater than 50% respiratory variability, suggesting right atrial pressure of 3 mmHg. FINDINGS  Left Ventricle: Left ventricular ejection fraction, by estimation, is 20 to 25%. The left ventricle has severely decreased function. The left ventricle demonstrates global hypokinesis. The left ventricular internal cavity size was mildly dilated. There is no left ventricular hypertrophy. Left ventricular diastolic parameters are consistent with Grade II diastolic dysfunction (pseudonormalization). Right Ventricle: The right ventricular size is normal. No increase in right ventricular wall thickness. Right ventricular systolic function is mildly reduced. There is normal pulmonary artery systolic pressure. The tricuspid regurgitant velocity is 2.61 m/s, and with an assumed right atrial  pressure of 3 mmHg, the estimated right ventricular systolic pressure is 78.4 mmHg. Left Atrium: Left atrial size was mild to moderately dilated. Right Atrium: Right atrial size was normal in size. Pericardium: There is no  evidence of pericardial effusion. Mitral Valve: The mitral valve is normal in structure. Mild mitral annular calcification. Trivial mitral valve regurgitation. No evidence of mitral valve stenosis. Tricuspid Valve: The tricuspid valve is normal in structure. Tricuspid valve regurgitation is trivial. Aortic Valve: The aortic valve is tricuspid. Aortic valve regurgitation is moderate. Aortic regurgitation PHT measures 292 msec. Mild aortic valve sclerosis is present, with no evidence of aortic valve stenosis. Pulmonic Valve: The pulmonic valve was normal in structure. Pulmonic valve regurgitation is not visualized. Aorta: The aortic root is normal in size and structure. Venous: The inferior vena cava is normal in size with greater than 50% respiratory variability, suggesting right atrial pressure of 3 mmHg. IAS/Shunts: PFO versus small ASD with left to right shunting. Additional Comments: A pacer wire is visualized in the right ventricle.  LEFT VENTRICLE PLAX 2D LVIDd:         5.80 cm  Diastology LVIDs:         4.70 cm  LV e' medial:    2.54 cm/s LV PW:         1.30 cm  LV E/e' medial:  27.3 LV IVS:        0.60 cm  LV e' lateral:   5.63 cm/s LVOT diam:     2.10 cm  LV E/e' lateral: 12.3 LV SV:         69 LV SV Index:   49 LVOT Area:     3.46 cm  RIGHT VENTRICLE            IVC RV Basal diam:  2.70 cm    IVC diam: 1.30 cm RV S prime:     6.53 cm/s TAPSE (M-mode): 1.7 cm LEFT ATRIUM             Index       RIGHT ATRIUM          Index LA diam:        4.40 cm 3.14 cm/m  RA Area:     7.67 cm LA Vol (A2C):   56.2 ml 40.07 ml/m RA Volume:   13.90 ml 9.91 ml/m LA Vol (A4C):   43.1 ml 30.73 ml/m LA Biplane Vol: 49.3 ml 35.15 ml/m  AORTIC VALVE LVOT Vmax:   108.00 cm/s LVOT Vmean:  65.800 cm/s LVOT VTI:     0.200 m AI PHT:      292 msec  AORTA Ao Root diam: 2.90 cm Ao Asc diam:  3.00 cm MITRAL VALVE               TRICUSPID VALVE MV Area (PHT): 2.66 cm    TR Peak grad:   27.2 mmHg MV Decel Time: 285 msec    TR Vmax:        261.00 cm/s MV E velocity: 69.40 cm/s MV A velocity: 47.50 cm/s  SHUNTS MV E/A ratio:  1.46        Systemic VTI:  0.20 m                            Systemic Diam: 2.10 cm Loralie Champagne MD Electronically signed by Loralie Champagne MD Signature Date/Time: 10/21/2020/4:37:07 PM    Final       Medications:     Scheduled Medications: . atorvastatin  80 mg Oral q1800  . dapagliflozin propanediol  10 mg Oral Daily  . doxycycline  100 mg Oral Q12H  . insulin aspart  0-9 Units Subcutaneous  Q4H  . ipratropium-albuterol  3 mL Nebulization Q6H  . ivabradine  7.5 mg Oral BID WC  . methylPREDNISolone (SOLU-MEDROL) injection  40 mg Intravenous Q12H  . potassium chloride SA  20 mEq Oral Daily  . sodium chloride flush  3 mL Intravenous Q12H  . spironolactone  25 mg Oral Daily     Infusions: . 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 Admitted on 10/21/2020 for hemoptysis.    Assessment/Plan   1. Hemoptysis - Off asa and brillinta.   - CCM following. Plan for Bronchoscopy next week.  - C/w steroids and nebs.   2. A/C Systolic Heart Failure, ICM  -Echo 02/12/19: EF 25-30%, RV mildly reduced -TTE 10/21/20: EF 20-25%, global hypokinesis, grade IIDD, mod AR, ? L-R shunt -Reds Clip 47% suggestive of volume overload on admission withelevated JVP. BNP >10000.  - Home: torsemide 60, farxiga 10, corlanor 7.5 BID, spiro 25 - C/w corlanor, monitor HR closely   3.  Renal insufficiency - Jump in Cr from 1.03 to 1.4 today, UOP yesterday 1.5L - Continue to monitor  4. COPD  -Known asthmatic/former smoker  5. CAD -NSTEMI/STEMI s/p Emergent CABG 02/03/18: Cath with severe 2v CAD as above with severe LV dysfunction/ICM. s/p Emergent CABG 02/03/18. - Had NSTEMI and now s/p LHC 02/12/19 with DES to the ostial ramus extending into the distal left main - HS trop 21>21  - Off aspirin. Can stay off brillinta given DES back in 02/2019.  - Continue high intensity statin.   6. H/O SCLC s/p treatment 2015 - Had chemo/radiation - Followed by Dr [name]  7. PAF  -In NSR today  -No A/C hemoptysis   Length of Stay: [place], NP  10/22/2020, 8:33 AM  Advanced Heart Failure Team Pager [phone] (M-F; 7a - 4p)  Please contact [facility] for night-coverage after hours (4p -7a ) and weekends on [email or web address]  Patient seen and examined with the above-signed Advanced Practice Provider and/or Housestaff. I personally reviewed laboratory data, imaging studies and relevant notes. I independently examined the patient and formulated the important aspects of the plan. I have edited the note to reflect any of my changes or salient points. I have personally discussed the plan with the patient and/or family.  She looks much better today. Breathing better. No further hemoptysis. Good output with one dose IV lasix. Creatinine has bumped a little.   General:  Lying flat in bed Thin No resp difficulty HEENT: normal Neck: supple. no JVD. Carotids 2+ bilat; no bruits. No lymphadenopathy or thryomegaly appreciated. Cor: PMI nondisplaced. Regular rate & rhythm. No rubs, gallops or murmurs. Lungs: clear Abdomen: soft, nontender, nondistended. No hepatosplenomegaly. No bruits or masses. Good bowel sounds. Extremities: no cyanosis, clubbing, rash, edema Neuro: alert & orientedx3, cranial nerves grossly intact. moves all 4 extremities w/o difficulty. Affect  pleasant  Suspect major issue is COPD flare and hemoptysis. Volume status ok. No angina. DAPT on hold for bronch Monday. Will hold diuretics today. Can restart orals on Sunday.   We will see again on Monday. Please call with questions.   Glori Bickers, MD  1:30 PM

## 2020-10-22 NOTE — Plan of Care (Signed)
  Problem: Education: Goal: Ability to demonstrate management of disease process will improve 10/22/2020 1043 by Arlina Robes, RN Outcome: Progressing 10/22/2020 1042 by Arlina Robes, RN Outcome: Progressing Goal: Ability to verbalize understanding of medication therapies will improve 10/22/2020 1043 by Arlina Robes, RN Outcome: Progressing 10/22/2020 1042 by Arlina Robes, RN Outcome: Progressing Goal: Individualized Educational Video(s) 10/22/2020 1043 by Arlina Robes, RN Outcome: Progressing 10/22/2020 1042 by Arlina Robes, RN Outcome: Progressing   Problem: Activity: Goal: Capacity to carry out activities will improve 10/22/2020 1043 by Arlina Robes, RN Outcome: Progressing 10/22/2020 1042 by Arlina Robes, RN Outcome: Progressing   Problem: Cardiac: Goal: Ability to achieve and maintain adequate cardiopulmonary perfusion will improve 10/22/2020 1043 by Arlina Robes, RN Outcome: Progressing 10/22/2020 1042 by Arlina Robes, RN Outcome: Progressing   Problem: Education: Goal: Knowledge of disease or condition will improve Outcome: Progressing Goal: Knowledge of the prescribed therapeutic regimen will improve Outcome: Progressing Goal: Individualized Educational Video(s) Outcome: Progressing   Problem: Activity: Goal: Ability to tolerate increased activity will improve Outcome: Progressing Goal: Will verbalize the importance of balancing activity with adequate rest periods Outcome: Progressing   Problem: Respiratory: Goal: Ability to maintain a clear airway will improve Outcome: Progressing Goal: Levels of oxygenation will improve Outcome: Progressing Goal: Ability to maintain adequate ventilation will improve Outcome: Progressing

## 2020-10-23 LAB — CBC WITH DIFFERENTIAL/PLATELET
Abs Immature Granulocytes: 0.08 10*3/uL — ABNORMAL HIGH (ref 0.00–0.07)
Basophils Absolute: 0 10*3/uL (ref 0.0–0.1)
Basophils Relative: 0 %
Eosinophils Absolute: 0 10*3/uL (ref 0.0–0.5)
Eosinophils Relative: 0 %
HCT: 42.7 % (ref 36.0–46.0)
Hemoglobin: 13.5 g/dL (ref 12.0–15.0)
Immature Granulocytes: 1 %
Lymphocytes Relative: 12 %
Lymphs Abs: 1.3 10*3/uL (ref 0.7–4.0)
MCH: 30.2 pg (ref 26.0–34.0)
MCHC: 31.6 g/dL (ref 30.0–36.0)
MCV: 95.5 fL (ref 80.0–100.0)
Monocytes Absolute: 0.9 10*3/uL (ref 0.1–1.0)
Monocytes Relative: 8 %
Neutro Abs: 8.7 10*3/uL — ABNORMAL HIGH (ref 1.7–7.7)
Neutrophils Relative %: 79 %
Platelets: 173 10*3/uL (ref 150–400)
RBC: 4.47 MIL/uL (ref 3.87–5.11)
RDW: 19.9 % — ABNORMAL HIGH (ref 11.5–15.5)
WBC: 11 10*3/uL — ABNORMAL HIGH (ref 4.0–10.5)
nRBC: 0 % (ref 0.0–0.2)

## 2020-10-23 LAB — BASIC METABOLIC PANEL
Anion gap: 8 (ref 5–15)
BUN: 41 mg/dL — ABNORMAL HIGH (ref 6–20)
CO2: 25 mmol/L (ref 22–32)
Calcium: 9.9 mg/dL (ref 8.9–10.3)
Chloride: 104 mmol/L (ref 98–111)
Creatinine, Ser: 1.16 mg/dL — ABNORMAL HIGH (ref 0.44–1.00)
GFR, Estimated: 54 mL/min — ABNORMAL LOW (ref >60)
Glucose, Bld: 103 mg/dL — ABNORMAL HIGH (ref 70–99)
Potassium: 4.2 mmol/L (ref 3.5–5.1)
Sodium: 137 mmol/L (ref 135–145)

## 2020-10-23 LAB — GLUCOSE, CAPILLARY
Glucose-Capillary: 105 mg/dL — ABNORMAL HIGH (ref 70–99)
Glucose-Capillary: 225 mg/dL — ABNORMAL HIGH (ref 70–99)
Glucose-Capillary: 226 mg/dL — ABNORMAL HIGH (ref 70–99)
Glucose-Capillary: 269 mg/dL — ABNORMAL HIGH (ref 70–99)

## 2020-10-23 MED ORDER — BENZONATATE 100 MG PO CAPS
100.0000 mg | ORAL_CAPSULE | Freq: Two times a day (BID) | ORAL | Status: DC
  Administered 2020-10-23 – 2020-10-26 (×7): 100 mg via ORAL
  Filled 2020-10-23 (×6): qty 1

## 2020-10-23 MED ORDER — SENNOSIDES-DOCUSATE SODIUM 8.6-50 MG PO TABS
2.0000 | ORAL_TABLET | Freq: Two times a day (BID) | ORAL | Status: DC
  Administered 2020-10-23 – 2020-10-24 (×2): 2 via ORAL
  Filled 2020-10-23 (×5): qty 2

## 2020-10-23 NOTE — Progress Notes (Signed)
Asked patient to ambulate reported she walked yesterday,reported she was more interested in a bath than walking today. Nurse tech informed of pt request for bath. No further changes noted.

## 2020-10-23 NOTE — Progress Notes (Signed)
Pt refused bipap for tonight 

## 2020-10-23 NOTE — Progress Notes (Signed)
Patient c/o cough writer beeped md for further orders.

## 2020-10-23 NOTE — Progress Notes (Addendum)
[facility]  PROGRESS NOTE  Brittany [name] NIO:[id number] DOB: 11-08-1961 DOA: 10/20/2020 PCP: Maude Leriche, PA-C Admit date - 10/20/2020   Admitting Physician Desiree Hane, MD  Outpatient Primary MD for the patient is Scifres, Durel Salts  LOS - 2 Brief Narrative  [name] is a 58 year old female with medical history significant for not limited to paroxysmal atrial fibrillation, COPD, SCLC treated with chemo, previous medical, chronic systolic CHF who presents with 2 days of worsening cough associated with hemoptysis with large blood clots, and new need for supplemental O2 at 2 L nasal cannula at home as well as 3 pound weight gain.  She was found to have evidence of emphysema on CTA but no PE, elevated BNP patient was admitted with working diagnosis of acute hypoxic respiratory failure presumed secondary to combined acute on chronic systolic CHF exacerbation, acute exacerbation of COPD.  Hospital course complicated significant increased work of breathing requiring BiPAP   Subjective  Today , with some right-sided rib cage pain.  Complains of headache.  States last episode of hemoptysis was yesterday.  Appetite well.  No BM. A & P    Acute COPD exacerbation,improving.  Wheezing resolved, on 2 L, no recurrent BiPAP required.  CHF exacerbation likely also contributed.   Typically at home does not require oxygen  -Appreciate heart failure recommendations to hold off on diuretics until 1/14 --Continue CHF home regimen --Appreciate PCCM recommendations, will continue scheduled duo nebs, doxycycline, -Switch IV Solu-Medrol to oral prednisone, flutter valve, incentive spirometry  Acute on chronic systolic CHF with history of ischemic cardiomyopathy.  TTE on 11/11 with EF 20-25%, and global hypokinesis  Elevated BNP on arrival, complains of orthopnea, PND with worsening cough and dyspnea on O2 requirements. Urine output of 1.5 L -Appreciate CHF team  recommendations, hold IV Lasix given euvolemia, restart oral regien on 11/14 -Daily output monitoring, strict I's and O's -Continue ivabradine, spironolactone, Farxiga  Hemoptysis.  Could be related to acute exacerbation of COPD.  CTA showed stable scarring in the right hilum consistent with emphysema.  Patient does have history of NSCLC some concern -Continue COPD exacerbation regimen -PCCM following, likely bronchoscopy on 11/15, awaiting ticagrelor washout.  Per cardiology does not need to continue [place] in the future -Aspirin discontinued by cardiology -Tessalon Perles, - warm Compress for rib cage pain  Hyperlipidemia, stable on -Home Lipitor     Family Communication  : None  Code Status : Full  Disposition Plan  :  Patient is from home. Anticipated d/c date: 2 to 3 days. Barriers to d/c or necessity for inpatient status:  close monitoring of O2 status, , eventually bronchoscopy on 11/15 Consults  : Cardiology, PCCM  Procedures  : TTE   DVT Prophylaxis  : SCDs  MDM: The below labs and imaging reports were reviewed and summarized above.  Medication management as above.  Lab Results  Component Value Date   PLT 173 10/23/2020    Diet :  Diet Order            Diet heart healthy/carb modified Room service appropriate? Yes; Fluid consistency: Thin; Fluid restriction: 1200 mL Fluid  Diet effective now                  Inpatient Medications Scheduled Meds: . atorvastatin  80 mg Oral q1800  . benzonatate  100 mg Oral BID  . doxycycline  100 mg Oral Q12H  . feeding supplement  237 mL Oral TID BM  . insulin aspart  0-9 Units Subcutaneous Q4H  . ipratropium-albuterol  3 mL Nebulization Q6H  . ivabradine  7.5 mg Oral BID WC  . predniSONE  20 mg Oral Q breakfast  . senna-docusate  2 tablet Oral BID  . sodium chloride flush  3 mL Intravenous Q12H  . spironolactone  25 mg Oral Daily   Continuous Infusions: . sodium chloride     PRN Meds:.sodium chloride,  acetaminophen, albuterol, ALPRAZolam, ondansetron (ZOFRAN) IV, sodium chloride flush  Antibiotics  :   Anti-infectives (From admission, onward)   Start     Dose/Rate Route Frequency Ordered Stop   10/21/20 1315  doxycycline (VIBRA-TABS) tablet 100 mg        100 mg Oral Every 12 hours 10/21/20 1308 10/26/20 0959       Objective   Vitals:   10/23/20 0400 10/23/20 0432 10/23/20 0809 10/23/20 1200  BP: 96/60   104/61  Pulse:   73   Resp: 14  16 15   Temp: 98.2 F (36.8 C)   98.8 F (37.1 C)  TempSrc:    Oral  SpO2: 100%  100% 100%  Weight:  48.7 kg    Height:        SpO2: 100 % O2 Flow Rate (L/min): 2 L/min FiO2 (%): 28 %  Wt Readings from Last 3 Encounters:  10/23/20 48.7 kg  06/11/20 50.4 kg  05/24/20 49.8 kg     Intake/Output Summary (Last 24 hours) at 10/23/2020 1453 Last data filed at 10/23/2020 0851 Gross per 24 hour  Intake 920 ml  Output 1050 ml  Net -130 ml    Physical Exam:    Thin female, in no acute distress Awake Alert, Oriented X 3, Normal affect No new F.N deficits,  [place].AT, Normal respiratory effort on 2L,CTAB RRR,No Gallops,Rubs or new Murmurs, no edema +ve B.Sounds, Abd Soft, No tenderness, No rebound, guarding or rigidity. No Cyanosis, No new Rash or bruise    I have personally reviewed the following:   Data Reviewed:  CBC Recent Labs  Lab 10/20/20 1702 10/20/20 1714 10/22/20 0435 10/23/20 0421  WBC 7.3  --  12.7* 11.0*  HGB 15.9* 17.7*  18.0* 14.8 13.5  HCT 50.2* 52.0*  53.0* 47.2* 42.7  PLT 205  --  176 173  MCV 94.2  --  96.5 95.5  MCH 29.8  --  30.3 30.2  MCHC 31.7  --  31.4 31.6  RDW 19.9*  --  20.2* 19.9*  LYMPHSABS 1.2  --  0.7 1.3  MONOABS 0.6  --  0.5 0.9  EOSABS 0.1  --  0.0 0.0  BASOSABS 0.1  --  0.0 0.0    Chemistries  Recent Labs  Lab 10/20/20 1702 10/20/20 1714 10/21/20 0251 10/22/20 0435 10/23/20 0421  NA 141 144  142 137 138 137  K 3.2* 3.1*  3.1* 4.9 4.8 4.2  CL 100 101 105 102 104  CO2  27  --  23 21* 25  GLUCOSE 130* 125* 238* 157* 103*  BUN 10 15 17  34* 41*  CREATININE 0.92 0.90 1.03* 1.40* 1.16*  CALCIUM 9.8  --  9.6 9.9 9.9  AST 23  --   --   --   --   ALT 15  --   --   --   --   ALKPHOS 90  --   --   --   --   BILITOT 1.5*  --   --   --   --    ------------------------------------------------------------------------------------------------------------------  No results for input(s): CHOL, HDL, LDLCALC, TRIG, CHOLHDL, LDLDIRECT in the last 72 hours.  Lab Results  Component Value Date   HGBA1C 6.0 (H) 10/21/2020   ------------------------------------------------------------------------------------------------------------------ No results for input(s): TSH, T4TOTAL, T3FREE, THYROIDAB in the last 72 hours.  Invalid input(s): FREET3 ------------------------------------------------------------------------------------------------------------------ No results for input(s): VITAMINB12, FOLATE, FERRITIN, TIBC, IRON, RETICCTPCT in the last 72 hours.  Coagulation profile No results for input(s): INR, PROTIME in the last 168 hours.  No results for input(s): DDIMER in the last 72 hours.  Cardiac Enzymes No results for input(s): CKMB, TROPONINI, MYOGLOBIN in the last 168 hours.  Invalid input(s): CK ------------------------------------------------------------------------------------------------------------------    Component Value Date/Time   BNP 1,118.4 (H) 10/20/2020 1702    Micro Results Recent Results (from the past 240 hour(s))  Respiratory Panel by RT PCR (Flu A&B, Covid) - Nasopharyngeal Swab     Status: None   Collection Time: 10/20/20  6:07 PM   Specimen: Nasopharyngeal Swab  Result Value Ref Range Status   SARS Coronavirus 2 by RT PCR NEGATIVE NEGATIVE Final    Comment: (NOTE) SARS-CoV-2 target nucleic acids are NOT DETECTED.  The SARS-CoV-2 RNA is generally detectable in upper respiratoy specimens during the acute phase of infection. The  lowest concentration of SARS-CoV-2 viral copies this assay can detect is 131 copies/mL. A negative result does not preclude SARS-Cov-2 infection and should not be used as the sole basis for treatment or other patient management decisions. A negative result may occur with  improper specimen collection/handling, submission of specimen other than nasopharyngeal swab, presence of viral mutation(s) within the areas targeted by this assay, and inadequate number of viral copies (<131 copies/mL). A negative result must be combined with clinical observations, patient history, and epidemiological information. The expected result is Negative.  Fact Sheet for Patients:  PinkCheek.be  Fact Sheet for Healthcare Providers:  GravelBags.it  This test is no t yet approved or cleared by the [place] FDA and  has been authorized for detection and/or diagnosis of SARS-CoV-2 by FDA under an Emergency Use Authorization (EUA). This EUA will remain  in effect (meaning this test can be used) for the duration of the COVID-19 declaration under Section 564(b)(1) of the Act, 21 U.S.C. section 360bbb-3(b)(1), unless the authorization is terminated or revoked sooner.     Influenza A by PCR NEGATIVE NEGATIVE Final   Influenza B by PCR NEGATIVE NEGATIVE Final    Comment: (NOTE) The Xpert Xpress SARS-CoV-2/FLU/RSV assay is intended as an aid in  the diagnosis of influenza from Nasopharyngeal swab specimens and  should not be used as a sole basis for treatment. Nasal washings and  aspirates are unacceptable for Xpert Xpress SARS-CoV-2/FLU/RSV  testing.  Fact Sheet for Patients: PinkCheek.be  Fact Sheet for Healthcare Providers: GravelBags.it  This test is not yet approved or cleared by the [place] FDA and  has been authorized for detection and/or diagnosis of SARS-CoV-2 by  FDA under  an Emergency Use Authorization (EUA). This EUA will remain  in effect (meaning this test can be used) for the duration of the  Covid-19 declaration under Section 564(b)(1) of the Act, 21  U.S.C. section 360bbb-3(b)(1), unless the authorization is  terminated or revoked. Performed at [facility], [place] [address], [place], [place] [address]     Radiology Reports CT Angio Chest PE W and/or Wo Contrast  Result Date: 10/20/2020 CLINICAL DATA:  Hemoptysis, coronary artery disease, short of breath, history of small cell lung cancer EXAM: CT ANGIOGRAPHY  CHEST WITH CONTRAST TECHNIQUE: Multidetector CT imaging of the chest was performed using the standard protocol during bolus administration of intravenous contrast. Multiplanar CT image reconstructions and MIPs were obtained to evaluate the vascular anatomy. CONTRAST:  15mL OMNIPAQUE IOHEXOL 350 MG/ML SOLN COMPARISON:  05/21/2020, 10/20/2020 FINDINGS: Cardiovascular: This is a technically adequate evaluation of the pulmonary vasculature. No filling defects or pulmonary emboli. Heart is enlarged, but stable. Postsurgical changes are seen from previous bypass surgery. Minimal atherosclerosis of the aortic arch. Mediastinum/Nodes: Right hilar prominence again noted, unchanged and likely representing post therapeutic changes from previous lung cancer treatment. There is chronic narrowing of the right upper, right middle, and right lower lobe bronchi. No pathologic adenopathy. Thyroid, trachea, and esophagus are unremarkable. Lungs/Pleura: Diffuse emphysema again noted. No acute airspace disease, effusion, or pneumothorax. Right perihilar consolidation consistent with chronic scarring from previous radiation therapy for lung cancer. Upper Abdomen: No acute abnormality. Musculoskeletal: No acute or destructive bony lesions. Reconstructed images demonstrate no additional findings. Review of the MIP images confirms the above findings. IMPRESSION: 1. No evidence of  pulmonary embolus. 2. Stable post therapeutic changes at the right hilum from prior radiation therapy for lung cancer. 3. Cardiomegaly. 4. Aortic Atherosclerosis (ICD10-I70.0) and Emphysema (ICD10-J43.9). Electronically Signed   By: Randa Ngo M.D.   On: 10/20/2020 21:44   DG Chest Port 1 View  Result Date: 10/20/2020 CLINICAL DATA:  Shortness of breath EXAM: PORTABLE CHEST 1 VIEW COMPARISON:  May 21, 2020 FINDINGS: The heart size and mediastinal contours are mildly enlarged. Overlying median sternotomy wires are present. A left-sided pacemaker seen with the lead tips in the right ventricle. Again noted is right perihilar scarring and interstitial markings. The visualized skeletal structures are unremarkable. IMPRESSION: No active disease.  Stable mild cardiomegaly. Electronically Signed   By: Prudencio Pair M.D.   On: 10/20/2020 17:36   ECHOCARDIOGRAM COMPLETE  Result Date: 10/21/2020    ECHOCARDIOGRAM REPORT   Patient Name:   Brittany [name] Date of Exam: 10/21/2020 Medical Rec #:  [id number]               Height:       59.0 in Accession #:    [id number]              Weight:       105.0 lb Date of Birth:  June 14, 1961               BSA:          1.403 m Patient Age:    18 years                BP:           114/67 mmHg Patient Gender: F                       HR:           70 bpm. Exam Location:  Inpatient Procedure: 2D Echo, Cardiac Doppler and Color Doppler Indications:    I50.23 Acute on chronic systolic (congestive) heart failure  History:        Patient has prior history of Echocardiogram examinations, most                 recent 02/12/2019. Prior CABG, Arrythmias:Atrial Fibrillation;                 Risk Factors:Hypertension and Diabetes. Cancer.  Sonographer:    Jonelle Sidle Dance Referring Phys: [place]  IMPRESSIONS  1. Left ventricular ejection fraction, by estimation, is 20 to 25%. The left ventricle has severely decreased function. The left ventricle demonstrates global hypokinesis.  The left ventricular internal cavity size was mildly dilated. Left ventricular diastolic parameters are consistent with Grade II diastolic dysfunction (pseudonormalization).  2. Right ventricular systolic function is mildly reduced. The right ventricular size is normal. There is normal pulmonary artery systolic pressure.  3. Left atrial size was mild to moderately dilated.  4. The mitral valve is normal in structure. Trivial mitral valve regurgitation. No evidence of mitral stenosis.  5. The aortic valve is tricuspid. Aortic valve regurgitation is moderate. PHT 268 msec. Mild aortic valve sclerosis is present, with no evidence of aortic valve stenosis.  6. Possible PFO versus small ASD with left to right shunting noted on apical images.  7. The inferior vena cava is normal in size with greater than 50% respiratory variability, suggesting right atrial pressure of 3 mmHg. FINDINGS  Left Ventricle: Left ventricular ejection fraction, by estimation, is 20 to 25%. The left ventricle has severely decreased function. The left ventricle demonstrates global hypokinesis. The left ventricular internal cavity size was mildly dilated. There is no left ventricular hypertrophy. Left ventricular diastolic parameters are consistent with Grade II diastolic dysfunction (pseudonormalization). Right Ventricle: The right ventricular size is normal. No increase in right ventricular wall thickness. Right ventricular systolic function is mildly reduced. There is normal pulmonary artery systolic pressure. The tricuspid regurgitant velocity is 2.61 m/s, and with an assumed right atrial pressure of 3 mmHg, the estimated right ventricular systolic pressure is 53.6 mmHg. Left Atrium: Left atrial size was mild to moderately dilated. Right Atrium: Right atrial size was normal in size. Pericardium: There is no evidence of pericardial effusion. Mitral Valve: The mitral valve is normal in structure. Mild mitral annular calcification. Trivial mitral  valve regurgitation. No evidence of mitral valve stenosis. Tricuspid Valve: The tricuspid valve is normal in structure. Tricuspid valve regurgitation is trivial. Aortic Valve: The aortic valve is tricuspid. Aortic valve regurgitation is moderate. Aortic regurgitation PHT measures 292 msec. Mild aortic valve sclerosis is present, with no evidence of aortic valve stenosis. Pulmonic Valve: The pulmonic valve was normal in structure. Pulmonic valve regurgitation is not visualized. Aorta: The aortic root is normal in size and structure. Venous: The inferior vena cava is normal in size with greater than 50% respiratory variability, suggesting right atrial pressure of 3 mmHg. IAS/Shunts: PFO versus small ASD with left to right shunting. Additional Comments: A pacer wire is visualized in the right ventricle.  LEFT VENTRICLE PLAX 2D LVIDd:         5.80 cm  Diastology LVIDs:         4.70 cm  LV e' medial:    2.54 cm/s LV PW:         1.30 cm  LV E/e' medial:  27.3 LV IVS:        0.60 cm  LV e' lateral:   5.63 cm/s LVOT diam:     2.10 cm  LV E/e' lateral: 12.3 LV SV:         69 LV SV Index:   49 LVOT Area:     3.46 cm  RIGHT VENTRICLE            IVC RV Basal diam:  2.70 cm    IVC diam: 1.30 cm RV S prime:     6.53 cm/s TAPSE (M-mode): 1.7 cm LEFT ATRIUM  Index       RIGHT ATRIUM          Index LA diam:        4.40 cm 3.14 cm/m  RA Area:     7.67 cm LA Vol (A2C):   56.2 ml 40.07 ml/m RA Volume:   13.90 ml 9.91 ml/m LA Vol (A4C):   43.1 ml 30.73 ml/m LA Biplane Vol: 49.3 ml 35.15 ml/m  AORTIC VALVE LVOT Vmax:   108.00 cm/s LVOT Vmean:  65.800 cm/s LVOT VTI:    0.200 m AI PHT:      292 msec  AORTA Ao Root diam: 2.90 cm Ao Asc diam:  3.00 cm MITRAL VALVE               TRICUSPID VALVE MV Area (PHT): 2.66 cm    TR Peak grad:   27.2 mmHg MV Decel Time: 285 msec    TR Vmax:        261.00 cm/s MV E velocity: 69.40 cm/s MV A velocity: 47.50 cm/s  SHUNTS MV E/A ratio:  1.46        Systemic VTI:  0.20 m                             Systemic Diam: 2.10 cm Loralie Champagne MD Electronically signed by Loralie Champagne MD Signature Date/Time: 10/21/2020/4:37:07 PM    Final    CUP PACEART REMOTE DEVICE CHECK  Result Date: 10/06/2020 Scheduled remote reviewed. Normal device function.  Next remote 91 days. Kathy Breach, RN, CCDS, [employer]    Time Spent in minutes  30     Desiree Hane M.D on 10/23/2020 at 2:53 PM  To page go to [email or web address] - password [email or web address]

## 2020-10-23 NOTE — Progress Notes (Signed)
Patient asking for something for cough reported would page MD again. Writer beeped MD again await further orders.

## 2020-10-23 NOTE — Plan of Care (Signed)
  Problem: Education: Goal: Ability to demonstrate management of disease process will improve Outcome: Progressing Goal: Ability to verbalize understanding of medication therapies will improve Outcome: Progressing Goal: Individualized Educational Video(s) Outcome: Progressing   Problem: Activity: Goal: Capacity to carry out activities will improve Outcome: Progressing   Problem: Cardiac: Goal: Ability to achieve and maintain adequate cardiopulmonary perfusion will improve Outcome: Progressing   Problem: Education: Goal: Knowledge of disease or condition will improve Outcome: Progressing Goal: Knowledge of the prescribed therapeutic regimen will improve Outcome: Progressing Goal: Individualized Educational Video(s) Outcome: Progressing   Problem: Activity: Goal: Ability to tolerate increased activity will improve Outcome: Progressing Goal: Will verbalize the importance of balancing activity with adequate rest periods Outcome: Progressing   Problem: Respiratory: Goal: Ability to maintain a clear airway will improve Outcome: Progressing Goal: Levels of oxygenation will improve Outcome: Progressing Goal: Ability to maintain adequate ventilation will improve Outcome: Progressing

## 2020-10-24 LAB — GLUCOSE, CAPILLARY
Glucose-Capillary: 113 mg/dL — ABNORMAL HIGH (ref 70–99)
Glucose-Capillary: 119 mg/dL — ABNORMAL HIGH (ref 70–99)
Glucose-Capillary: 157 mg/dL — ABNORMAL HIGH (ref 70–99)
Glucose-Capillary: 167 mg/dL — ABNORMAL HIGH (ref 70–99)
Glucose-Capillary: 180 mg/dL — ABNORMAL HIGH (ref 70–99)
Glucose-Capillary: 186 mg/dL — ABNORMAL HIGH (ref 70–99)

## 2020-10-24 LAB — CBC WITH DIFFERENTIAL/PLATELET
Abs Immature Granulocytes: 0.03 10*3/uL (ref 0.00–0.07)
Basophils Absolute: 0 10*3/uL (ref 0.0–0.1)
Basophils Relative: 0 %
Eosinophils Absolute: 0 10*3/uL (ref 0.0–0.5)
Eosinophils Relative: 0 %
HCT: 40.2 % (ref 36.0–46.0)
Hemoglobin: 12.9 g/dL (ref 12.0–15.0)
Immature Granulocytes: 0 %
Lymphocytes Relative: 10 %
Lymphs Abs: 1 10*3/uL (ref 0.7–4.0)
MCH: 30.8 pg (ref 26.0–34.0)
MCHC: 32.1 g/dL (ref 30.0–36.0)
MCV: 95.9 fL (ref 80.0–100.0)
Monocytes Absolute: 0.9 10*3/uL (ref 0.1–1.0)
Monocytes Relative: 9 %
Neutro Abs: 7.9 10*3/uL — ABNORMAL HIGH (ref 1.7–7.7)
Neutrophils Relative %: 81 %
Platelets: 168 10*3/uL (ref 150–400)
RBC: 4.19 MIL/uL (ref 3.87–5.11)
RDW: 19.7 % — ABNORMAL HIGH (ref 11.5–15.5)
WBC: 9.8 10*3/uL (ref 4.0–10.5)
nRBC: 0 % (ref 0.0–0.2)

## 2020-10-24 LAB — BASIC METABOLIC PANEL
Anion gap: 9 (ref 5–15)
BUN: 35 mg/dL — ABNORMAL HIGH (ref 6–20)
CO2: 26 mmol/L (ref 22–32)
Calcium: 10.1 mg/dL (ref 8.9–10.3)
Chloride: 104 mmol/L (ref 98–111)
Creatinine, Ser: 1.02 mg/dL — ABNORMAL HIGH (ref 0.44–1.00)
GFR, Estimated: >60 mL/min (ref >60)
Glucose, Bld: 135 mg/dL — ABNORMAL HIGH (ref 70–99)
Potassium: 4.2 mmol/L (ref 3.5–5.1)
Sodium: 139 mmol/L (ref 135–145)

## 2020-10-24 MED ORDER — TORSEMIDE 20 MG PO TABS
60.0000 mg | ORAL_TABLET | Freq: Every day | ORAL | Status: DC
  Administered 2020-10-24 – 2020-10-25 (×2): 60 mg via ORAL
  Filled 2020-10-24 (×2): qty 3

## 2020-10-24 MED ORDER — IPRATROPIUM-ALBUTEROL 0.5-2.5 (3) MG/3ML IN SOLN
3.0000 mL | Freq: Two times a day (BID) | RESPIRATORY_TRACT | Status: DC
  Administered 2020-10-24 (×2): 3 mL via RESPIRATORY_TRACT
  Filled 2020-10-24 (×2): qty 3

## 2020-10-24 NOTE — Plan of Care (Signed)
  Problem: Education: Goal: Ability to demonstrate management of disease process will improve Outcome: Progressing Goal: Ability to verbalize understanding of medication therapies will improve Outcome: Progressing Goal: Individualized Educational Video(s) Outcome: Progressing   Problem: Activity: Goal: Capacity to carry out activities will improve Outcome: Progressing   Problem: Cardiac: Goal: Ability to achieve and maintain adequate cardiopulmonary perfusion will improve Outcome: Progressing   Problem: Education: Goal: Knowledge of disease or condition will improve Outcome: Progressing Goal: Knowledge of the prescribed therapeutic regimen will improve Outcome: Progressing Goal: Individualized Educational Video(s) Outcome: Progressing   Problem: Activity: Goal: Ability to tolerate increased activity will improve Outcome: Progressing Goal: Will verbalize the importance of balancing activity with adequate rest periods Outcome: Progressing   Problem: Respiratory: Goal: Ability to maintain a clear airway will improve Outcome: Progressing Goal: Levels of oxygenation will improve Outcome: Progressing Goal: Ability to maintain adequate ventilation will improve Outcome: Progressing

## 2020-10-24 NOTE — Progress Notes (Signed)
Ambulated patient from her room to halfway up hall and back on room air her sats maintained greter than 92%,she reported with o2 on she could ambulate farther without having to stop for frequent breaks. She ambulated with rolling walker with stand by assist. Patient does have home o2 per her report. She is for bronchoscopy in the am will be NPO after midnight MD in talk with her regarding tx plan. No further changes noted.

## 2020-10-24 NOTE — Progress Notes (Signed)
[facility]  PROGRESS NOTE  Brittany [name] SEG:[id number] DOB: 1961-08-19 DOA: 10/20/2020 PCP: Maude Leriche, PA-C Admit date - 10/20/2020   Admitting Physician Desiree Hane, MD  Outpatient Primary MD for the patient is Scifres, Durel Salts  LOS - 3 Brief Narrative  Brittany [name] is a 59 year old female with medical history significant for not limited to paroxysmal atrial fibrillation, COPD, SCLC treated with chemo, previous medical, chronic systolic CHF who presents with 2 days of worsening cough associated with hemoptysis with large blood clots, and new need for supplemental O2 at 2 L nasal cannula at home as well as 3 pound weight gain.  She was found to have evidence of emphysema on CTA but no PE, elevated BNP patient was admitted with working diagnosis of acute hypoxic respiratory failure presumed secondary to combined acute on chronic systolic CHF exacerbation, acute exacerbation of COPD.  Hospital course complicated significant increased work of breathing requiring BiPAP   Subjective  Reports only mild cough, resolution of rib cage pain.  Has not had any episodes of blood clots with cough. A & P    Acute COPD exacerbation,improving.  Wheezing resolved, on room air, no recurrent BiPAP required.  CHF exacerbation likely also contributed.   Typically at home does not require oxygen continuously.  Did not require any oxygen during ambulation today -Appreciate heart failure recommendations to hold off on diuretics until 11/14 --Continue CHF home regimen --Appreciate PCCM recommendations, will continue scheduled duo nebs, doxycycline, -oral prednisone, flutter valve, incentive spirometry  Acute on chronic systolic CHF with history of ischemic cardiomyopathy, improving.  TTE on 11/11 with EF 20-25%, and global hypokinesis  Elevated BNP on arrival, complains of orthopnea, PND with worsening cough and dyspnea on O2 requirements.  Urine output 1.6 L in the past  24 hours.  Net -700 cc his hospital stay. -Appreciate CHF team recommendations, underwent IV Lasix diuresis due to AKI and euvolemia, can resume oral torsemide -Daily output monitoring, strict I's and O's -Continue ivabradine, spironolactone, Farxiga  Hemoptysis, stable.  Could be related to acute exacerbation of COPD.  CTA showed stable scarring in the right hilum consistent with emphysema.  Patient does have history of NSCLC some concern -Continue COPD exacerbation regimen -PCCM following, likely bronchoscopy on 11/15, n.p.o. at midnight, awaiting ticagrelor washout.  Per cardiology does not need to continue [place] in the future -Aspirin discontinued by cardiology -Tessalon Perles, - warm Compress for rib cage pain  Hyperlipidemia, stable on -Home Lipitor     Family Communication  : None  Code Status : Full  Disposition Plan  :  Patient is from home. Anticipated d/c date: 2 to 3 days. Barriers to d/c or necessity for inpatient status:  close monitoring of O2 status, , possible bronchoscopy on 11/15 per pulmonology Consults  : Cardiology, PCCM  Procedures  : TTE   DVT Prophylaxis  : SCDs  MDM: The below labs and imaging reports were reviewed and summarized above.  Medication management as above.  Lab Results  Component Value Date   PLT 168 10/24/2020    Diet :  Diet Order            Diet NPO time specified  Diet effective midnight           Diet heart healthy/carb modified Room service appropriate? Yes; Fluid consistency: Thin; Fluid restriction: 1200 mL Fluid  Diet effective now                  Inpatient Medications  Scheduled Meds: . atorvastatin  80 mg Oral q1800  . benzonatate  100 mg Oral BID  . doxycycline  100 mg Oral Q12H  . feeding supplement  237 mL Oral TID BM  . insulin aspart  0-9 Units Subcutaneous Q4H  . ipratropium-albuterol  3 mL Nebulization BID  . ivabradine  7.5 mg Oral BID WC  . senna-docusate  2 tablet Oral BID  . sodium chloride  flush  3 mL Intravenous Q12H  . spironolactone  25 mg Oral Daily   Continuous Infusions: . sodium chloride     PRN Meds:.sodium chloride, acetaminophen, albuterol, ALPRAZolam, ondansetron (ZOFRAN) IV, sodium chloride flush  Antibiotics  :   Anti-infectives (From admission, onward)   Start     Dose/Rate Route Frequency Ordered Stop   10/21/20 1315  doxycycline (VIBRA-TABS) tablet 100 mg        100 mg Oral Every 12 hours 10/21/20 1308 10/26/20 0959       Objective   Vitals:   10/24/20 0200 10/24/20 0522 10/24/20 0800 10/24/20 0847  BP:   103/83   Pulse:   84   Resp:   17   Temp:   98.4 F (36.9 C)   TempSrc:   Oral   SpO2: 98%  100% 100%  Weight:  49.4 kg    Height:  4\' 11"  (1.499 m)      SpO2: 100 % O2 Flow Rate (L/min): 2 L/min FiO2 (%): 28 %  Wt Readings from Last 3 Encounters:  10/24/20 49.4 kg  06/11/20 50.4 kg  05/24/20 49.8 kg     Intake/Output Summary (Last 24 hours) at 10/24/2020 1604 Last data filed at 10/23/2020 2226 Gross per 24 hour  Intake 1077 ml  Output 950 ml  Net 127 ml    Physical Exam:    Thin female, in no acute distress Awake Alert, Oriented X 3, Normal affect No new F.N deficits,  [place].AT, Normal respiratory effort on room air,CTAB RRR,No Gallops,Rubs or new Murmurs, no edema +ve B.Sounds, Abd Soft, No tenderness, No rebound, guarding or rigidity. No Cyanosis, No new Rash or bruise    I have personally reviewed the following:   Data Reviewed:  CBC Recent Labs  Lab 10/20/20 1702 10/20/20 1714 10/22/20 0435 10/23/20 0421 10/24/20 0135  WBC 7.3  --  12.7* 11.0* 9.8  HGB 15.9* 17.7*  18.0* 14.8 13.5 12.9  HCT 50.2* 52.0*  53.0* 47.2* 42.7 40.2  PLT 205  --  176 173 168  MCV 94.2  --  96.5 95.5 95.9  MCH 29.8  --  30.3 30.2 30.8  MCHC 31.7  --  31.4 31.6 32.1  RDW 19.9*  --  20.2* 19.9* 19.7*  LYMPHSABS 1.2  --  0.7 1.3 1.0  MONOABS 0.6  --  0.5 0.9 0.9  EOSABS 0.1  --  0.0 0.0 0.0  BASOSABS 0.1  --  0.0 0.0 0.0      Chemistries  Recent Labs  Lab 10/20/20 1702 10/20/20 1702 10/20/20 1714 10/21/20 0251 10/22/20 0435 10/23/20 0421 10/24/20 0135  NA 141   < > 144  142 137 138 137 139  K 3.2*   < > 3.1*  3.1* 4.9 4.8 4.2 4.2  CL 100   < > 101 105 102 104 104  CO2 27  --   --  23 21* 25 26  GLUCOSE 130*   < > 125* 238* 157* 103* 135*  BUN 10   < > 15 17 34* 41*  35*  CREATININE 0.92   < > 0.90 1.03* 1.40* 1.16* 1.02*  CALCIUM 9.8  --   --  9.6 9.9 9.9 10.1  AST 23  --   --   --   --   --   --   ALT 15  --   --   --   --   --   --   ALKPHOS 90  --   --   --   --   --   --   BILITOT 1.5*  --   --   --   --   --   --    < > = values in this interval not displayed.   ------------------------------------------------------------------------------------------------------------------ No results for input(s): CHOL, HDL, LDLCALC, TRIG, CHOLHDL, LDLDIRECT in the last 72 hours.  Lab Results  Component Value Date   HGBA1C 6.0 (H) 10/21/2020   ------------------------------------------------------------------------------------------------------------------ No results for input(s): TSH, T4TOTAL, T3FREE, THYROIDAB in the last 72 hours.  Invalid input(s): FREET3 ------------------------------------------------------------------------------------------------------------------ No results for input(s): VITAMINB12, FOLATE, FERRITIN, TIBC, IRON, RETICCTPCT in the last 72 hours.  Coagulation profile No results for input(s): INR, PROTIME in the last 168 hours.  No results for input(s): DDIMER in the last 72 hours.  Cardiac Enzymes No results for input(s): CKMB, TROPONINI, MYOGLOBIN in the last 168 hours.  Invalid input(s): CK ------------------------------------------------------------------------------------------------------------------    Component Value Date/Time   BNP 1,118.4 (H) 10/20/2020 1702    Micro Results Recent Results (from the past 240 hour(s))  Respiratory Panel by RT PCR (Flu  A&B, Covid) - Nasopharyngeal Swab     Status: None   Collection Time: 10/20/20  6:07 PM   Specimen: Nasopharyngeal Swab  Result Value Ref Range Status   SARS Coronavirus 2 by RT PCR NEGATIVE NEGATIVE Final    Comment: (NOTE) SARS-CoV-2 target nucleic acids are NOT DETECTED.  The SARS-CoV-2 RNA is generally detectable in upper respiratoy specimens during the acute phase of infection. The lowest concentration of SARS-CoV-2 viral copies this assay can detect is 131 copies/mL. A negative result does not preclude SARS-Cov-2 infection and should not be used as the sole basis for treatment or other patient management decisions. A negative result may occur with  improper specimen collection/handling, submission of specimen other than nasopharyngeal swab, presence of viral mutation(s) within the areas targeted by this assay, and inadequate number of viral copies (<131 copies/mL). A negative result must be combined with clinical observations, patient history, and epidemiological information. The expected result is Negative.  Fact Sheet for Patients:  PinkCheek.be  Fact Sheet for Healthcare Providers:  GravelBags.it  This test is no t yet approved or cleared by the [place] FDA and  has been authorized for detection and/or diagnosis of SARS-CoV-2 by FDA under an Emergency Use Authorization (EUA). This EUA will remain  in effect (meaning this test can be used) for the duration of the COVID-19 declaration under Section 564(b)(1) of the Act, 21 U.S.C. section 360bbb-3(b)(1), unless the authorization is terminated or revoked sooner.     Influenza A by PCR NEGATIVE NEGATIVE Final   Influenza B by PCR NEGATIVE NEGATIVE Final    Comment: (NOTE) The Xpert Xpress SARS-CoV-2/FLU/RSV assay is intended as an aid in  the diagnosis of influenza from Nasopharyngeal swab specimens and  should not be used as a sole basis for treatment. Nasal  washings and  aspirates are unacceptable for Xpert Xpress SARS-CoV-2/FLU/RSV  testing.  Fact Sheet for Patients: PinkCheek.be  Fact Sheet for Healthcare Providers: GravelBags.it  This test is not yet approved or cleared by the [place] and  has been authorized for detection and/or diagnosis of SARS-CoV-2 by  FDA under an Emergency Use Authorization (EUA). This EUA will remain  in effect (meaning this test can be used) for the duration of the  Covid-19 declaration under Section 564(b)(1) of the Act, 21  U.S.C. section 360bbb-3(b)(1), unless the authorization is  terminated or revoked. Performed at [facility], [place] [address] [place], [place] [address]     Radiology Reports CT Angio Chest PE W and/or Wo Contrast  Result Date: 10/20/2020 CLINICAL DATA:  Hemoptysis, coronary artery disease, short of breath, history of small cell lung cancer EXAM: CT ANGIOGRAPHY CHEST WITH CONTRAST TECHNIQUE: Multidetector CT imaging of the chest was performed using the standard protocol during bolus administration of intravenous contrast. Multiplanar CT image reconstructions and MIPs were obtained to evaluate the vascular anatomy. CONTRAST:  37mL OMNIPAQUE IOHEXOL 350 MG/ML SOLN COMPARISON:  05/21/2020, 10/20/2020 FINDINGS: Cardiovascular: This is a technically adequate evaluation of the pulmonary vasculature. No filling defects or pulmonary emboli. Heart is enlarged, but stable. Postsurgical changes are seen from previous bypass surgery. Minimal atherosclerosis of the aortic arch. Mediastinum/Nodes: Right hilar prominence again noted, unchanged and likely representing post therapeutic changes from previous lung cancer treatment. There is chronic narrowing of the right upper, right middle, and right lower lobe bronchi. No pathologic adenopathy. Thyroid, trachea, and esophagus are unremarkable. Lungs/Pleura: Diffuse emphysema again noted.  No acute airspace disease, effusion, or pneumothorax. Right perihilar consolidation consistent with chronic scarring from previous radiation therapy for lung cancer. Upper Abdomen: No acute abnormality. Musculoskeletal: No acute or destructive bony lesions. Reconstructed images demonstrate no additional findings. Review of the MIP images confirms the above findings. IMPRESSION: 1. No evidence of pulmonary embolus. 2. Stable post therapeutic changes at the right hilum from prior radiation therapy for lung cancer. 3. Cardiomegaly. 4. Aortic Atherosclerosis (ICD10-I70.0) and Emphysema (ICD10-J43.9). Electronically Signed   By: Randa Ngo M.D.   On: 10/20/2020 21:44   DG Chest Port 1 View  Result Date: 10/20/2020 CLINICAL DATA:  Shortness of breath EXAM: PORTABLE CHEST 1 VIEW COMPARISON:  May 21, 2020 FINDINGS: The heart size and mediastinal contours are mildly enlarged. Overlying median sternotomy wires are present. A left-sided pacemaker seen with the lead tips in the right ventricle. Again noted is right perihilar scarring and interstitial markings. The visualized skeletal structures are unremarkable. IMPRESSION: No active disease.  Stable mild cardiomegaly. Electronically Signed   By: Prudencio Pair M.D.   On: 10/20/2020 17:36   ECHOCARDIOGRAM COMPLETE  Result Date: 10/21/2020    ECHOCARDIOGRAM REPORT   Patient Name:   Brittany [name] Date of Exam: 10/21/2020 Medical Rec #:  [id number]               Height:       59.0 in Accession #:    [id number]              Weight:       105.0 lb Date of Birth:  1961-11-12               BSA:          1.403 m Patient Age:    59 years                BP:           114/67 mmHg Patient Gender: F  HR:           70 bpm. Exam Location:  Inpatient Procedure: 2D Echo, Cardiac Doppler and Color Doppler Indications:    I50.23 Acute on chronic systolic (congestive) heart failure  History:        Patient has prior history of Echocardiogram examinations,  most                 recent 02/12/2019. Prior CABG, Arrythmias:Atrial Fibrillation;                 Risk Factors:Hypertension and Diabetes. Cancer.  Sonographer:    Jonelle Sidle Dance Referring Phys: Arnett  1. Left ventricular ejection fraction, by estimation, is 20 to 25%. The left ventricle has severely decreased function. The left ventricle demonstrates global hypokinesis. The left ventricular internal cavity size was mildly dilated. Left ventricular diastolic parameters are consistent with Grade II diastolic dysfunction (pseudonormalization).  2. Right ventricular systolic function is mildly reduced. The right ventricular size is normal. There is normal pulmonary artery systolic pressure.  3. Left atrial size was mild to moderately dilated.  4. The mitral valve is normal in structure. Trivial mitral valve regurgitation. No evidence of mitral stenosis.  5. The aortic valve is tricuspid. Aortic valve regurgitation is moderate. PHT 268 msec. Mild aortic valve sclerosis is present, with no evidence of aortic valve stenosis.  6. Possible PFO versus small ASD with left to right shunting noted on apical images.  7. The inferior vena cava is normal in size with greater than 50% respiratory variability, suggesting right atrial pressure of 3 mmHg. FINDINGS  Left Ventricle: Left ventricular ejection fraction, by estimation, is 20 to 25%. The left ventricle has severely decreased function. The left ventricle demonstrates global hypokinesis. The left ventricular internal cavity size was mildly dilated. There is no left ventricular hypertrophy. Left ventricular diastolic parameters are consistent with Grade II diastolic dysfunction (pseudonormalization). Right Ventricle: The right ventricular size is normal. No increase in right ventricular wall thickness. Right ventricular systolic function is mildly reduced. There is normal pulmonary artery systolic pressure. The tricuspid regurgitant velocity is 2.61  m/s, and with an assumed right atrial pressure of 3 mmHg, the estimated right ventricular systolic pressure is 85.4 mmHg. Left Atrium: Left atrial size was mild to moderately dilated. Right Atrium: Right atrial size was normal in size. Pericardium: There is no evidence of pericardial effusion. Mitral Valve: The mitral valve is normal in structure. Mild mitral annular calcification. Trivial mitral valve regurgitation. No evidence of mitral valve stenosis. Tricuspid Valve: The tricuspid valve is normal in structure. Tricuspid valve regurgitation is trivial. Aortic Valve: The aortic valve is tricuspid. Aortic valve regurgitation is moderate. Aortic regurgitation PHT measures 292 msec. Mild aortic valve sclerosis is present, with no evidence of aortic valve stenosis. Pulmonic Valve: The pulmonic valve was normal in structure. Pulmonic valve regurgitation is not visualized. Aorta: The aortic root is normal in size and structure. Venous: The inferior vena cava is normal in size with greater than 50% respiratory variability, suggesting right atrial pressure of 3 mmHg. IAS/Shunts: PFO versus small ASD with left to right shunting. Additional Comments: A pacer wire is visualized in the right ventricle.  LEFT VENTRICLE PLAX 2D LVIDd:         5.80 cm  Diastology LVIDs:         4.70 cm  LV e' medial:    2.54 cm/s LV PW:         1.30 cm  LV  E/e' medial:  27.3 LV IVS:        0.60 cm  LV e' lateral:   5.63 cm/s LVOT diam:     2.10 cm  LV E/e' lateral: 12.3 LV SV:         69 LV SV Index:   49 LVOT Area:     3.46 cm  RIGHT VENTRICLE            IVC RV Basal diam:  2.70 cm    IVC diam: 1.30 cm RV S prime:     6.53 cm/s TAPSE (M-mode): 1.7 cm LEFT ATRIUM             Index       RIGHT ATRIUM          Index LA diam:        4.40 cm 3.14 cm/m  RA Area:     7.67 cm LA Vol (A2C):   56.2 ml 40.07 ml/m RA Volume:   13.90 ml 9.91 ml/m LA Vol (A4C):   43.1 ml 30.73 ml/m LA Biplane Vol: 49.3 ml 35.15 ml/m  AORTIC VALVE LVOT Vmax:   108.00  cm/s LVOT Vmean:  65.800 cm/s LVOT VTI:    0.200 m AI PHT:      292 msec  AORTA Ao Root diam: 2.90 cm Ao Asc diam:  3.00 cm MITRAL VALVE               TRICUSPID VALVE MV Area (PHT): 2.66 cm    TR Peak grad:   27.2 mmHg MV Decel Time: 285 msec    TR Vmax:        261.00 cm/s MV E velocity: 69.40 cm/s MV A velocity: 47.50 cm/s  SHUNTS MV E/A ratio:  1.46        Systemic VTI:  0.20 m                            Systemic Diam: 2.10 cm Loralie Champagne MD Electronically signed by Loralie Champagne MD Signature Date/Time: 10/21/2020/4:37:07 PM    Final    CUP PACEART REMOTE DEVICE CHECK  Result Date: 10/06/2020 Scheduled remote reviewed. Normal device function.  Next remote 91 days. Kathy Breach, RN, CCDS, [employer]    Time Spent in minutes  30     Desiree Hane M.D on 10/24/2020 at 4:04 PM  To page go to [email or web address] - password [email or web address]

## 2020-10-24 NOTE — Progress Notes (Signed)
Pt refused bipap for tonight 

## 2020-10-24 NOTE — Anesthesia Preprocedure Evaluation (Addendum)
Anesthesia Evaluation  Patient identified by MRN, date of birth, ID band Patient awake    Reviewed: Allergy & Precautions, H&P , NPO status , Patient's Chart, lab work & pertinent test results  Airway Mallampati: II  TM Distance: >3 FB Neck ROM: Full    Dental no notable dental hx. (+) Poor Dentition, Dental Advisory Given   Pulmonary asthma , COPD,  COPD inhaler, former smoker,    Pulmonary exam normal breath sounds clear to auscultation       Cardiovascular Exercise Tolerance: Good hypertension, Pt. on medications and Pt. on home beta blockers + CAD, + Past MI, + CABG and +CHF  + pacemaker + Cardiac Defibrillator + Valvular Problems/Murmurs AI  Rhythm:Regular Rate:Normal     Neuro/Psych Anxiety negative neurological ROS     GI/Hepatic negative GI ROS, Neg liver ROS,   Endo/Other  diabetes, Type 2, Oral Hypoglycemic Agents  Renal/GU negative Renal ROS  negative genitourinary   Musculoskeletal   Abdominal   Peds  Hematology negative hematology ROS (+)   Anesthesia Other Findings   Reproductive/Obstetrics negative OB ROS                            Anesthesia Physical Anesthesia Plan  ASA: IV  Anesthesia Plan: General   Post-op Pain Management:    Induction: Intravenous  PONV Risk Score and Plan: 4 or greater and Ondansetron, Dexamethasone and Midazolam  Airway Management Planned: Oral ETT  Additional Equipment:   Intra-op Plan:   Post-operative Plan: Extubation in OR  Informed Consent: I have reviewed the patients History and Physical, chart, labs and discussed the procedure including the risks, benefits and alternatives for the proposed anesthesia with the patient or authorized representative who has indicated his/her understanding and acceptance.     Dental advisory given  Plan Discussed with: CRNA  Anesthesia Plan Comments:        Anesthesia Quick  Evaluation

## 2020-10-25 ENCOUNTER — Encounter (HOSPITAL_COMMUNITY)
Admission: EM | Disposition: A | Discharge status: Home / Self Care | Payer: Self-pay | Source: Home / Self Care | Attending: Internal Medicine

## 2020-10-25 ENCOUNTER — Inpatient Hospital Stay (HOSPITAL_COMMUNITY): Payer: BC Managed Care – PPO | Admitting: Anesthesiology

## 2020-10-25 ENCOUNTER — Encounter (HOSPITAL_COMMUNITY): Payer: Self-pay | Admitting: Internal Medicine

## 2020-10-25 DIAGNOSIS — R042 Hemoptysis: Secondary | ICD-10-CM | POA: Diagnosis not present

## 2020-10-25 DIAGNOSIS — E119 Type 2 diabetes mellitus without complications: Secondary | ICD-10-CM

## 2020-10-25 DIAGNOSIS — J441 Chronic obstructive pulmonary disease with (acute) exacerbation: Principal | ICD-10-CM

## 2020-10-25 DIAGNOSIS — I10 Essential (primary) hypertension: Secondary | ICD-10-CM

## 2020-10-25 HISTORY — PX: ENDOBRONCHIAL ULTRASOUND: SHX5096

## 2020-10-25 HISTORY — PX: BRONCHIAL BRUSHINGS: SHX5108

## 2020-10-25 HISTORY — PX: BRONCHIAL NEEDLE ASPIRATION BIOPSY: SHX5106

## 2020-10-25 HISTORY — PX: FLEXIBLE BRONCHOSCOPY: SHX5094

## 2020-10-25 LAB — GLUCOSE, CAPILLARY
Glucose-Capillary: 171 mg/dL — ABNORMAL HIGH (ref 70–99)
Glucose-Capillary: 214 mg/dL — ABNORMAL HIGH (ref 70–99)
Glucose-Capillary: 237 mg/dL — ABNORMAL HIGH (ref 70–99)
Glucose-Capillary: 99 mg/dL (ref 70–99)

## 2020-10-25 LAB — BASIC METABOLIC PANEL
Anion gap: 10 (ref 5–15)
BUN: 31 mg/dL — ABNORMAL HIGH (ref 6–20)
CO2: 28 mmol/L (ref 22–32)
Calcium: 9.4 mg/dL (ref 8.9–10.3)
Chloride: 101 mmol/L (ref 98–111)
Creatinine, Ser: 1.07 mg/dL — ABNORMAL HIGH (ref 0.44–1.00)
GFR, Estimated: 59 mL/min — ABNORMAL LOW (ref >60)
Glucose, Bld: 100 mg/dL — ABNORMAL HIGH (ref 70–99)
Potassium: 4 mmol/L (ref 3.5–5.1)
Sodium: 139 mmol/L (ref 135–145)

## 2020-10-25 SURGERY — ENDOBRONCHIAL ULTRASOUND (EBUS)
Anesthesia: General

## 2020-10-25 MED ORDER — MIDAZOLAM HCL 5 MG/5ML IJ SOLN
INTRAMUSCULAR | Status: DC | PRN
  Administered 2020-10-25: 1 mg via INTRAVENOUS

## 2020-10-25 MED ORDER — ETOMIDATE 2 MG/ML IV SOLN
INTRAVENOUS | Status: DC | PRN
  Administered 2020-10-25: 10 mg via INTRAVENOUS

## 2020-10-25 MED ORDER — ONDANSETRON HCL 4 MG/2ML IJ SOLN
INTRAMUSCULAR | Status: DC | PRN
  Administered 2020-10-25: 4 mg via INTRAVENOUS

## 2020-10-25 MED ORDER — ACETAMINOPHEN 500 MG PO TABS
1000.0000 mg | ORAL_TABLET | Freq: Once | ORAL | Status: AC
  Administered 2020-10-25: 1000 mg via ORAL
  Filled 2020-10-25: qty 2

## 2020-10-25 MED ORDER — IPRATROPIUM-ALBUTEROL 0.5-2.5 (3) MG/3ML IN SOLN: 3.0000 mL | Freq: Four times a day (QID) | RESPIRATORY_TRACT | Status: DC | PRN

## 2020-10-25 MED ORDER — CELECOXIB 200 MG PO CAPS
200.0000 mg | ORAL_CAPSULE | Freq: Once | ORAL | Status: AC
  Administered 2020-10-25: 200 mg via ORAL
  Filled 2020-10-25: qty 1

## 2020-10-25 MED ORDER — ROCURONIUM BROMIDE 10 MG/ML (PF) SYRINGE
PREFILLED_SYRINGE | INTRAVENOUS | Status: DC | PRN
  Administered 2020-10-25: 40 mg via INTRAVENOUS

## 2020-10-25 MED ORDER — LACTATED RINGERS IV SOLN: INTRAVENOUS | Status: DC

## 2020-10-25 MED ORDER — SUGAMMADEX SODIUM 200 MG/2ML IV SOLN
INTRAVENOUS | Status: DC | PRN
  Administered 2020-10-25: 200 mg via INTRAVENOUS

## 2020-10-25 MED ORDER — DEXAMETHASONE SODIUM PHOSPHATE 10 MG/ML IJ SOLN
INTRAMUSCULAR | Status: DC | PRN
  Administered 2020-10-25: 4 mg via INTRAVENOUS

## 2020-10-25 MED ORDER — FENTANYL CITRATE (PF) 100 MCG/2ML IJ SOLN
INTRAMUSCULAR | Status: DC | PRN
  Administered 2020-10-25: 50 ug via INTRAVENOUS

## 2020-10-25 NOTE — Progress Notes (Signed)
Occupational Therapy Treatment Patient Details Name: Brittany [name] MRN: [id number] DOB: [birth year] Today's Date: 10/25/2020    History of present illness Pt is a 59 year old woman admitted on 10/21/20 with hemoptysis,  worsening cough, 3lb weight gain and shortness of breath. Pt required bipap. PMH: CAD, NSTEMI, CABG, COPD, advanced CHF, small cell lung cancer 7 years ago, DM2.    OT comments  Pt progressing towards established OT goals. Pt performing toileting, grooming, and functional mobility at Supervision level; using rollator for support and fatigue. Providing education and handout on energy conservation for ADLs and IADLs. Discussed ways pt can implement into daily routine. SpO2 97-89% on RA. Continue to recommend dc to home once medically stable per phsyician. Will continue to follow acutely as admitted.    Follow Up Recommendations  No OT follow up    Equipment Recommendations  None recommended by OT    Recommendations for Other Services      Precautions / Restrictions Precautions Precautions: Fall Restrictions Weight Bearing Restrictions: No       Mobility Bed Mobility Overal bed mobility: Independent                Transfers Overall transfer level: Needs assistance Equipment used: None;4-wheeled walker Transfers: Sit to/from Stand Sit to Stand: Modified independent (Device/Increase time);Supervision         General transfer comment: Moved from bed to recliner without device.  She did however require cues for locking rollator brakes to take a rest in halls on rollator.  Educated to back rollator to a firm surface and lock brakes before sitting.    Balance Overall balance assessment: Needs assistance Sitting-balance support: No upper extremity supported;Feet supported Sitting balance-Leahy Scale: Good     Standing balance support: No upper extremity supported;During functional activity Standing balance-Leahy Scale: Fair                              ADL either performed or assessed with clinical judgement   ADL Overall ADL's : Needs assistance/impaired     Grooming: Oral care;Wash/dry hands;Standing;Supervision/safety Grooming Details (indicate cue type and reason): Close supervision for safety while standing. Pt had a tendency to rush and she fatigues quickly. Cues for taking her time and sitting as needed             Lower Body Dressing: Supervision/safety;Sit to/from stand Lower Body Dressing Details (indicate cue type and reason): Cuing pt to sit for LB dressing and use figure four method for energy conservation. Pt donning new underwear with supervision. Toilet Transfer: Supervision/safety;Ambulation   Toileting- Clothing Manipulation and Hygiene: Supervision/safety;Sitting/lateral lean       Functional mobility during ADLs: Minimal assistance (single hand held A; rollator) General ADL Comments: Pt performing toileting and grooming at sink with Supervision. Providing education and handout on EC and reviewedi n full.      Vision       Perception     Praxis      Cognition Arousal/Alertness: Awake/alert Behavior During Therapy: WFL for tasks assessed/performed Overall Cognitive Status: Within Functional Limits for tasks assessed                                          Exercises     Shoulder Instructions       General Comments SpO2 97-89% on RA  Pertinent Vitals/ Pain       Pain Assessment: No/denies pain  Home Living                                          Prior Functioning/Environment              Frequency  Min 2X/week        Progress Toward Goals  OT Goals(current goals can now be found in the care plan section)  Progress towards OT goals: Progressing toward goals  Acute Rehab OT Goals Patient Stated Goal: return home OT Goal Formulation: With patient Time For Goal Achievement: 11/05/20 Potential to Achieve Goals:  Good ADL Goals Additional ADL Goal #1: Pt will perform basic ADL modified independently. Additional ADL Goal #2: Pt will generalize energy conservation strategies in ADL.  Plan Discharge plan remains appropriate    Co-evaluation                 AM-PAC OT "6 Clicks" Daily Activity     Outcome Measure   Help from another person eating meals?: None Help from another person taking care of personal grooming?: A Little Help from another person toileting, which includes using toliet, bedpan, or urinal?: A Little Help from another person bathing (including washing, rinsing, drying)?: A Little Help from another person to put on and taking off regular upper body clothing?: None Help from another person to put on and taking off regular lower body clothing?: A Little 6 Click Score: 20    End of Session Equipment Utilized During Treatment: Gait belt;Other (comment) (rollator)  OT Visit Diagnosis: Other abnormalities of gait and mobility (R26.89);Pain;Muscle weakness (generalized) (M62.81)   Activity Tolerance Patient tolerated treatment well   Patient Left Other (comment) (with PTA in hallway)   Nurse Communication Mobility status        Time: 2725-3664 OT Time Calculation (min): 32 min  Charges: OT General Charges $OT Visit: 1 Visit OT Treatments $Self Care/Home Management : 23-37 mins  Saxon, OTR/L Acute Rehab Pager: [phone] Office: [place] 10/25/2020, 5:16 PM

## 2020-10-25 NOTE — Anesthesia Procedure Notes (Addendum)
Procedure Name: Intubation Date/Time: 10/25/2020 7:36 AM Performed by: Lowella Dell, CRNA Pre-anesthesia Checklist: Patient identified, Emergency Drugs available, Suction available and Patient being monitored Patient Re-evaluated:Patient Re-evaluated prior to induction Oxygen Delivery Method: Circle System Utilized Preoxygenation: Pre-oxygenation with 100% oxygen Induction Type: IV induction Ventilation: Mask ventilation without difficulty Laryngoscope Size: Mac and 3 Grade View: Grade I Tube type: Oral Tube size: 8.5 mm Number of attempts: 1 Airway Equipment and Method: Stylet Placement Confirmation: ETT inserted through vocal cords under direct vision,  positive ETCO2 and breath sounds checked- equal and bilateral Secured at: 22 cm Tube secured with: Tape Dental Injury: Teeth and Oropharynx as per pre-operative assessment

## 2020-10-25 NOTE — Progress Notes (Signed)
[facility]  PROGRESS NOTE  Brittany [name] BPZ:[id number] DOB: 1961/10/03 DOA: 10/20/2020 PCP: Maude Leriche, PA-C Admit date - 10/20/2020   Admitting Physician Desiree Hane, MD  Outpatient Primary MD for the patient is Scifres, Durel Salts  LOS - 4 Brief Narrative  Brittany [name] is a 59 year old female with medical history significant for not limited to paroxysmal atrial fibrillation, COPD, SCLC treated with chemo, previous medical, chronic systolic CHF who presents with 2 days of worsening cough associated with hemoptysis with large blood clots, and new need for supplemental O2 at 2 L nasal cannula at home as well as 3 pound weight gain.  She was found to have evidence of emphysema on CTA but no PE, elevated BNP patient was admitted with working diagnosis of acute hypoxic respiratory failure presumed secondary to combined acute on chronic systolic CHF exacerbation, acute exacerbation of COPD.  Hospital course complicated significant increased work of breathing requiring BiPAP   Subjective  States her breathing feels much better after bronchoscopy this morning.  Denies any chest pain.  Only minimal cough.  Walking in the hall with therapy A & P    Acute COPD exacerbation resolved.  Wheezing resolved, on room air, no recurrent BiPAP required.  CHF exacerbation likely also contributed.   Typically at home does not require oxygen continuously.  Not requiring oxygen with ambulation halls --Continue CHF home regimen, back on torsemide --As needed duo nebs transitioend today, doxycycline, -Completed prednisone burst x 3 days, flutter valve, incentive spirometry  Acute on chronic systolic CHF with history of ischemic cardiomyopathy, stable.  TTE on 11/11 with EF 20-25%, and global hypokinesis  Elevated BNP on arrival, complains of orthopnea, PND with worsening cough and dyspnea on O2 requirements.  Urine output 1.6 L in the past 24 hours.  Net -700 cc his hospital  stay. -Daily output monitoring, strict I's and O's -Continue torsemide, spironolactone, -Plan to restart Farxiga in a.m. 111/60 -Holding home enema due to low heart rates, CHF team following, appreciate recommendations  Hemoptysis, stable.  Could be related to acute exacerbation of COPD.  CTA showed stable scarring in the right hilum consistent with emphysema.  Patient does have history of NSCLC some concern.  Underwent bronchoscopy on 11/15, no obvious source of bleeding found on examination -Follow-up transbronchial biopsy pathology -Continue COPD exacerbation regimen -Aspirin discontinued by cardiology -Tessalon Perles, - warm Compress for rib cage pain  Hyperlipidemia, stable on -Home Lipitor     Family Communication  : None  Code Status : Full  Disposition Plan  :  Patient is from home. Anticipated d/c date: 1 day. Barriers to d/c or necessity for inpatient status:  close monitoring of O2 status, , monitoring volume status Consults  : Cardiology, PCCM  Procedures  : TTE  Bronchoscopy on 10/25/20  DVT Prophylaxis  : SCDs  MDM: The below labs and imaging reports were reviewed and summarized above.  Medication management as above.  Lab Results  Component Value Date   PLT 168 10/24/2020    Diet :  Diet Order            Diet heart healthy/carb modified Room service appropriate? Yes; Fluid consistency: Thin  Diet effective now                  Inpatient Medications Scheduled Meds: . atorvastatin  80 mg Oral q1800  . benzonatate  100 mg Oral BID  . doxycycline  100 mg Oral Q12H  . feeding supplement  237  mL Oral TID BM  . insulin aspart  0-9 Units Subcutaneous Q4H  . ivabradine  7.5 mg Oral BID WC  . senna-docusate  2 tablet Oral BID  . sodium chloride flush  3 mL Intravenous Q12H  . spironolactone  25 mg Oral Daily  . torsemide  60 mg Oral Daily   Continuous Infusions: . sodium chloride    . lactated ringers 10 mL/hr at 10/25/20 0704   PRN Meds:.sodium  chloride, acetaminophen, albuterol, ALPRAZolam, ipratropium-albuterol, ondansetron (ZOFRAN) IV, sodium chloride flush  Antibiotics  :   Anti-infectives (From admission, onward)   Start     Dose/Rate Route Frequency Ordered Stop   10/21/20 1315  doxycycline (VIBRA-TABS) tablet 100 mg        100 mg Oral Every 12 hours 10/21/20 1308 10/26/20 0959       Objective   Vitals:   10/25/20 0839 10/25/20 0848 10/25/20 0858 10/25/20 0951  BP: 114/63 (!) 114/59 (!) 103/59 100/63  Pulse: 88 79 77 69  Resp: 17 16 15    Temp: 97.6 F (36.4 C)     TempSrc: Axillary     SpO2: 99% 97% 91% 100%  Weight:      Height:        SpO2: 100 % O2 Flow Rate (L/min): 3 L/min FiO2 (%): 28 %  Wt Readings from Last 3 Encounters:  10/25/20 48.3 kg  06/11/20 50.4 kg  05/24/20 49.8 kg     Intake/Output Summary (Last 24 hours) at 10/25/2020 1747 Last data filed at 10/25/2020 1451 Gross per 24 hour  Intake 320 ml  Output 2520 ml  Net -2200 ml    Physical Exam:    Thin female, in no acute distress Awake Alert, Oriented X 3, Normal affect No new F.N deficits,  [place].AT, Normal respiratory effort on room air,  no edema    I have personally reviewed the following:   Data Reviewed:  CBC Recent Labs  Lab 10/20/20 1702 10/20/20 1714 10/22/20 0435 10/23/20 0421 10/24/20 0135  WBC 7.3  --  12.7* 11.0* 9.8  HGB 15.9* 17.7*  18.0* 14.8 13.5 12.9  HCT 50.2* 52.0*  53.0* 47.2* 42.7 40.2  PLT 205  --  176 173 168  MCV 94.2  --  96.5 95.5 95.9  MCH 29.8  --  30.3 30.2 30.8  MCHC 31.7  --  31.4 31.6 32.1  RDW 19.9*  --  20.2* 19.9* 19.7*  LYMPHSABS 1.2  --  0.7 1.3 1.0  MONOABS 0.6  --  0.5 0.9 0.9  EOSABS 0.1  --  0.0 0.0 0.0  BASOSABS 0.1  --  0.0 0.0 0.0    Chemistries  Recent Labs  Lab 10/20/20 1702 10/20/20 1714 10/21/20 0251 10/22/20 0435 10/23/20 0421 10/24/20 0135 10/25/20 0632  NA 141   < > 137 138 137 139 139  K 3.2*   < > 4.9 4.8 4.2 4.2 4.0  CL 100   < > 105 102 104  104 101  CO2 27   < > 23 21* 25 26 28   GLUCOSE 130*   < > 238* 157* 103* 135* 100*  BUN 10   < > 17 34* 41* 35* 31*  CREATININE 0.92   < > 1.03* 1.40* 1.16* 1.02* 1.07*  CALCIUM 9.8   < > 9.6 9.9 9.9 10.1 9.4  AST 23  --   --   --   --   --   --   ALT 15  --   --   --   --   --   --  ALKPHOS 90  --   --   --   --   --   --   BILITOT 1.5*  --   --   --   --   --   --    < > = values in this interval not displayed.   ------------------------------------------------------------------------------------------------------------------ No results for input(s): CHOL, HDL, LDLCALC, TRIG, CHOLHDL, LDLDIRECT in the last 72 hours.  Lab Results  Component Value Date   HGBA1C 6.0 (H) 10/21/2020   ------------------------------------------------------------------------------------------------------------------ No results for input(s): TSH, T4TOTAL, T3FREE, THYROIDAB in the last 72 hours.  Invalid input(s): FREET3 ------------------------------------------------------------------------------------------------------------------ No results for input(s): VITAMINB12, FOLATE, FERRITIN, TIBC, IRON, RETICCTPCT in the last 72 hours.  Coagulation profile No results for input(s): INR, PROTIME in the last 168 hours.  No results for input(s): DDIMER in the last 72 hours.  Cardiac Enzymes No results for input(s): CKMB, TROPONINI, MYOGLOBIN in the last 168 hours.  Invalid input(s): CK ------------------------------------------------------------------------------------------------------------------    Component Value Date/Time   BNP 1,118.4 (H) 10/20/2020 1702    Micro Results Recent Results (from the past 240 hour(s))  Respiratory Panel by RT PCR (Flu A&B, Covid) - Nasopharyngeal Swab     Status: None   Collection Time: 10/20/20  6:07 PM   Specimen: Nasopharyngeal Swab  Result Value Ref Range Status   SARS Coronavirus 2 by RT PCR NEGATIVE NEGATIVE Final    Comment: (NOTE) SARS-CoV-2 target nucleic  acids are NOT DETECTED.  The SARS-CoV-2 RNA is generally detectable in upper respiratoy specimens during the acute phase of infection. The lowest concentration of SARS-CoV-2 viral copies this assay can detect is 131 copies/mL. A negative result does not preclude SARS-Cov-2 infection and should not be used as the sole basis for treatment or other patient management decisions. A negative result may occur with  improper specimen collection/handling, submission of specimen other than nasopharyngeal swab, presence of viral mutation(s) within the areas targeted by this assay, and inadequate number of viral copies (<131 copies/mL). A negative result must be combined with clinical observations, patient history, and epidemiological information. The expected result is Negative.  Fact Sheet for Patients:  PinkCheek.be  Fact Sheet for Healthcare Providers:  GravelBags.it  This test is no t yet approved or cleared by the [place] FDA and  has been authorized for detection and/or diagnosis of SARS-CoV-2 by FDA under an Emergency Use Authorization (EUA). This EUA will remain  in effect (meaning this test can be used) for the duration of the COVID-19 declaration under Section 564(b)(1) of the Act, 21 U.S.C. section 360bbb-3(b)(1), unless the authorization is terminated or revoked sooner.     Influenza A by PCR NEGATIVE NEGATIVE Final   Influenza B by PCR NEGATIVE NEGATIVE Final    Comment: (NOTE) The Xpert Xpress SARS-CoV-2/FLU/RSV assay is intended as an aid in  the diagnosis of influenza from Nasopharyngeal swab specimens and  should not be used as a sole basis for treatment. Nasal washings and  aspirates are unacceptable for Xpert Xpress SARS-CoV-2/FLU/RSV  testing.  Fact Sheet for Patients: PinkCheek.be  Fact Sheet for Healthcare Providers: GravelBags.it  This test  is not yet approved or cleared by the [place] FDA and  has been authorized for detection and/or diagnosis of SARS-CoV-2 by  FDA under an Emergency Use Authorization (EUA). This EUA will remain  in effect (meaning this test can be used) for the duration of the  Covid-19 declaration under Section 564(b)(1) of the Act, 21  U.S.C. section 360bbb-3(b)(1), unless the authorization  is  terminated or revoked. Performed at [facility], [place] [address] [place], [place] [address]     Radiology Reports CT Angio Chest PE W and/or Wo Contrast  Result Date: 10/20/2020 CLINICAL DATA:  Hemoptysis, coronary artery disease, short of breath, history of small cell lung cancer EXAM: CT ANGIOGRAPHY CHEST WITH CONTRAST TECHNIQUE: Multidetector CT imaging of the chest was performed using the standard protocol during bolus administration of intravenous contrast. Multiplanar CT image reconstructions and MIPs were obtained to evaluate the vascular anatomy. CONTRAST:  64mL OMNIPAQUE IOHEXOL 350 MG/ML SOLN COMPARISON:  05/21/2020, 10/20/2020 FINDINGS: Cardiovascular: This is a technically adequate evaluation of the pulmonary vasculature. No filling defects or pulmonary emboli. Heart is enlarged, but stable. Postsurgical changes are seen from previous bypass surgery. Minimal atherosclerosis of the aortic arch. Mediastinum/Nodes: Right hilar prominence again noted, unchanged and likely representing post therapeutic changes from previous lung cancer treatment. There is chronic narrowing of the right upper, right middle, and right lower lobe bronchi. No pathologic adenopathy. Thyroid, trachea, and esophagus are unremarkable. Lungs/Pleura: Diffuse emphysema again noted. No acute airspace disease, effusion, or pneumothorax. Right perihilar consolidation consistent with chronic scarring from previous radiation therapy for lung cancer. Upper Abdomen: No acute abnormality. Musculoskeletal: No acute or destructive bony  lesions. Reconstructed images demonstrate no additional findings. Review of the MIP images confirms the above findings. IMPRESSION: 1. No evidence of pulmonary embolus. 2. Stable post therapeutic changes at the right hilum from prior radiation therapy for lung cancer. 3. Cardiomegaly. 4. Aortic Atherosclerosis (ICD10-I70.0) and Emphysema (ICD10-J43.9). Electronically Signed   By: Randa Ngo M.D.   On: 10/20/2020 21:44   DG Chest Port 1 View  Result Date: 10/20/2020 CLINICAL DATA:  Shortness of breath EXAM: PORTABLE CHEST 1 VIEW COMPARISON:  May 21, 2020 FINDINGS: The heart size and mediastinal contours are mildly enlarged. Overlying median sternotomy wires are present. A left-sided pacemaker seen with the lead tips in the right ventricle. Again noted is right perihilar scarring and interstitial markings. The visualized skeletal structures are unremarkable. IMPRESSION: No active disease.  Stable mild cardiomegaly. Electronically Signed   By: Prudencio Pair M.D.   On: 10/20/2020 17:36   ECHOCARDIOGRAM COMPLETE  Result Date: 10/21/2020    ECHOCARDIOGRAM REPORT   Patient Name:   KITA NEACE Date of Exam: 10/21/2020 Medical Rec #:  [id number]               Height:       59.0 in Accession #:    [id number]              Weight:       105.0 lb Date of Birth:  17-Oct-1961               BSA:          1.403 m Patient Age:    18 years                BP:           114/67 mmHg Patient Gender: F                       HR:           70 bpm. Exam Location:  Inpatient Procedure: 2D Echo, Cardiac Doppler and Color Doppler Indications:    I50.23 Acute on chronic systolic (congestive) heart failure  History:        Patient has prior history of Echocardiogram examinations, most  recent 02/12/2019. Prior CABG, Arrythmias:Atrial Fibrillation;                 Risk Factors:Hypertension and Diabetes. Cancer.  Sonographer:    Jonelle Sidle Dance Referring Phys: Bernie  1. Left ventricular  ejection fraction, by estimation, is 20 to 25%. The left ventricle has severely decreased function. The left ventricle demonstrates global hypokinesis. The left ventricular internal cavity size was mildly dilated. Left ventricular diastolic parameters are consistent with Grade II diastolic dysfunction (pseudonormalization).  2. Right ventricular systolic function is mildly reduced. The right ventricular size is normal. There is normal pulmonary artery systolic pressure.  3. Left atrial size was mild to moderately dilated.  4. The mitral valve is normal in structure. Trivial mitral valve regurgitation. No evidence of mitral stenosis.  5. The aortic valve is tricuspid. Aortic valve regurgitation is moderate. PHT 268 msec. Mild aortic valve sclerosis is present, with no evidence of aortic valve stenosis.  6. Possible PFO versus small ASD with left to right shunting noted on apical images.  7. The inferior vena cava is normal in size with greater than 50% respiratory variability, suggesting right atrial pressure of 3 mmHg. FINDINGS  Left Ventricle: Left ventricular ejection fraction, by estimation, is 20 to 25%. The left ventricle has severely decreased function. The left ventricle demonstrates global hypokinesis. The left ventricular internal cavity size was mildly dilated. There is no left ventricular hypertrophy. Left ventricular diastolic parameters are consistent with Grade II diastolic dysfunction (pseudonormalization). Right Ventricle: The right ventricular size is normal. No increase in right ventricular wall thickness. Right ventricular systolic function is mildly reduced. There is normal pulmonary artery systolic pressure. The tricuspid regurgitant velocity is 2.61 m/s, and with an assumed right atrial pressure of 3 mmHg, the estimated right ventricular systolic pressure is 67.5 mmHg. Left Atrium: Left atrial size was mild to moderately dilated. Right Atrium: Right atrial size was normal in size. Pericardium:  There is no evidence of pericardial effusion. Mitral Valve: The mitral valve is normal in structure. Mild mitral annular calcification. Trivial mitral valve regurgitation. No evidence of mitral valve stenosis. Tricuspid Valve: The tricuspid valve is normal in structure. Tricuspid valve regurgitation is trivial. Aortic Valve: The aortic valve is tricuspid. Aortic valve regurgitation is moderate. Aortic regurgitation PHT measures 292 msec. Mild aortic valve sclerosis is present, with no evidence of aortic valve stenosis. Pulmonic Valve: The pulmonic valve was normal in structure. Pulmonic valve regurgitation is not visualized. Aorta: The aortic root is normal in size and structure. Venous: The inferior vena cava is normal in size with greater than 50% respiratory variability, suggesting right atrial pressure of 3 mmHg. IAS/Shunts: PFO versus small ASD with left to right shunting. Additional Comments: A pacer wire is visualized in the right ventricle.  LEFT VENTRICLE PLAX 2D LVIDd:         5.80 cm  Diastology LVIDs:         4.70 cm  LV e' medial:    2.54 cm/s LV PW:         1.30 cm  LV E/e' medial:  27.3 LV IVS:        0.60 cm  LV e' lateral:   5.63 cm/s LVOT diam:     2.10 cm  LV E/e' lateral: 12.3 LV SV:         69 LV SV Index:   49 LVOT Area:     3.46 cm  RIGHT VENTRICLE  IVC RV Basal diam:  2.70 cm    IVC diam: 1.30 cm RV S prime:     6.53 cm/s TAPSE (M-mode): 1.7 cm LEFT ATRIUM             Index       RIGHT ATRIUM          Index LA diam:        4.40 cm 3.14 cm/m  RA Area:     7.67 cm LA Vol (A2C):   56.2 ml 40.07 ml/m RA Volume:   13.90 ml 9.91 ml/m LA Vol (A4C):   43.1 ml 30.73 ml/m LA Biplane Vol: 49.3 ml 35.15 ml/m  AORTIC VALVE LVOT Vmax:   108.00 cm/s LVOT Vmean:  65.800 cm/s LVOT VTI:    0.200 m AI PHT:      292 msec  AORTA Ao Root diam: 2.90 cm Ao Asc diam:  3.00 cm MITRAL VALVE               TRICUSPID VALVE MV Area (PHT): 2.66 cm    TR Peak grad:   27.2 mmHg MV Decel Time: 285 msec    TR  Vmax:        261.00 cm/s MV E velocity: 69.40 cm/s MV A velocity: 47.50 cm/s  SHUNTS MV E/A ratio:  1.46        Systemic VTI:  0.20 m                            Systemic Diam: 2.10 cm Loralie Champagne MD Electronically signed by Loralie Champagne MD Signature Date/Time: 10/21/2020/4:37:07 PM    Final    CUP PACEART REMOTE DEVICE CHECK  Result Date: 10/06/2020 Scheduled remote reviewed. Normal device function.  Next remote 91 days. Kathy Breach, RN, CCDS, [employer]    Time Spent in minutes  30     Desiree Hane M.D on 10/25/2020 at 5:47 PM  To page go to [email or web address] - password [facility]

## 2020-10-25 NOTE — Interval H&P Note (Signed)
History and Physical Interval Note:  10/25/2020 7:00 AM  Brittany [name]  has presented today for surgery, with the diagnosis of hemoptysis, history of lung cancer.  The various methods of treatment have been discussed with the patient and family. After consideration of risks, benefits and other options for treatment, the patient has consented to  Procedure(s): ENDOBRONCHIAL ULTRASOUND (N/A) as a surgical intervention.  The patient's history has been reviewed, patient examined, no change in status, stable for surgery.  I have reviewed the patient's chart and labs.  Questions were answered to the patient's satisfaction.     Collene Gobble

## 2020-10-25 NOTE — Plan of Care (Signed)
?  Problem: Activity: ?Goal: Capacity to carry out activities will improve ?Outcome: Progressing ?  ?

## 2020-10-25 NOTE — Progress Notes (Addendum)
Advanced Heart Failure Rounding Note  PCP-Cardiologist: No primary care provider on file.   Subjective:     Admitted Oct 21, 2020 for hemoptysis under Hospitalist services and CCM consulted with plan to bronch after washout from [place].    Today: S/P bronch today, denies chest pain or shortness of breath.  Small amount of pink hemoptysis this morning prior to bronch and now just dry cough.    Has not needed to use bipap overnight in 3 days.     Objective:   Weight Range: 48.3 kg Body mass index is 21.51 kg/m.   Vital Signs:   Temp:  [98.3 F (36.8 C)-99 F (37.2 C)] 98.3 F (36.8 C) (11/15 0655) Pulse Rate:  [72-84] 72 (11/15 0655) Resp:  [15-18] 15 (11/15 0655) BP: (100-112)/(59-83) 100/59 (11/15 0655) SpO2:  [96 %-100 %] 96 % (11/15 0655) Weight:  [48.3 kg] 48.3 kg (11/15 0655) Last BM Date: 10/24/20  Weight change: Filed Weights   10/24/20 0522 10/25/20 0002 10/25/20 0655  Weight: 49.4 kg 48.3 kg 48.3 kg    Intake/Output:   Intake/Output Summary (Last 24 hours) at 10/25/2020 0718 Last data filed at 10/24/2020 2216 Gross per 24 hour  Intake 960 ml  Output 1750 ml  Net -790 ml      Physical Exam    General:  Well appearing. No resp difficulty.  HEENT: normal Neck: supple. Carotids 2+ bilat; no bruits. No lymphadenopathy or thryomegaly appreciated. Cor: PMI nondisplaced. Regular rate & rhythm. No rubs, gallops or murmurs. Lungs: clear bilaterally. Wearing 3L of oxygen via n/c.  Abdomen: soft, nontender, nondistended.  Extremities: no cyanosis, clubbing, rash, edema.  Neuro: alert & oriented x 3, cranial nerves grossly intact. moves all 4 extremities w/o difficulty. Affect pleasant    Telemetry   SR with rates 80-90s, personally reviewed.    EKG    SR on EKG 10/21/2020.   Labs    CBC Recent Labs    10/23/20 0421 10/24/20 0135  WBC 11.0* 9.8  NEUTROABS 8.7* 7.9*  HGB 13.5 12.9  HCT 42.7 40.2  MCV 95.5 95.9  PLT 173 585   Basic  Metabolic Panel Recent Labs    10/23/20 0421 10/24/20 0135  NA 137 139  K 4.2 4.2  CL 104 104  CO2 25 26  GLUCOSE 103* 135*  BUN 41* 35*  CREATININE 1.16* 1.02*  CALCIUM 9.9 10.1   Liver Function Tests No results for input(s): AST, ALT, ALKPHOS, BILITOT, PROT, ALBUMIN in the last 72 hours. No results for input(s): LIPASE, AMYLASE in the last 72 hours. Cardiac Enzymes No results for input(s): CKTOTAL, CKMB, CKMBINDEX, TROPONINI in the last 72 hours.  BNP: BNP (last 3 results) Recent Labs    05/21/20 1102 10/20/20 1702  BNP 896.5* 1,118.4*    ProBNP (last 3 results) No results for input(s): PROBNP in the last 8760 hours.   D-Dimer No results for input(s): DDIMER in the last 72 hours. Hemoglobin A1C No results for input(s): HGBA1C in the last 72 hours. Fasting Lipid Panel No results for input(s): CHOL, HDL, LDLCALC, TRIG, CHOLHDL, LDLDIRECT in the last 72 hours. Thyroid Function Tests No results for input(s): TSH, T4TOTAL, T3FREE, THYROIDAB in the last 72 hours.  Invalid input(s): FREET3  Other results:   Imaging    . No results found.   Medications:     Scheduled Medications: . . Doug Sou Hold] atorvastatin .  80 mg . Oral . q1800  . . Doug Sou Hold]  benzonatate .  100 mg . Oral . BID  . . Doug Sou Hold] doxycycline .  100 mg . Oral . Q12H  . . Doug Sou Hold] feeding supplement .  237 mL . Oral . TID BM  . . Doug Sou Hold] insulin aspart .  0-9 Units . Subcutaneous . Q4H  . . Doug Sou Hold] ipratropium-albuterol .  3 mL . Nebulization . BID  . . Doug Sou Hold] ivabradine .  7.5 mg . Oral . BID WC  . Marland Kitchen Doug Sou Hold] senna-docusate .  2 tablet . Oral . BID  . . Doug Sou Hold] sodium chloride flush .  3 mL . Intravenous . Q12H  . . Doug Sou Hold] spironolactone .  25 mg . Oral . Daily  . . Doug Sou Hold] torsemide .  60 mg . Oral . Daily  .   Infusions: . . Doug Sou Hold] sodium chloride .    . . . lactated ringers . 10 mL/hr at 10/25/20 0708  .   PRN  Medications: . 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 Admitted on 10/21/2020 for hemoptysis.    Assessment/Plan   1. Hemoptysis - Off asa and brillinta.   - CCM following. Bronch today showed no active bleeding, biopsy obtained. - Completed prednisone. - C/w doxycycline and nebs per primary team.    2. A/C Systolic Heart Failure, ICM  -Echo 02/12/19: EF 25-30%, RV mildly reduced -TTE 10/21/20: EF 20-25%, global hypokinesis, grade IIDD, mod AR, ? L-R shunt - On admission: Reds Clip 47% suggestive of volume overload w/ elevated JVP. BNP >10000.  - Home: torsemide 60, farxiga 10, corlanor 7.5 BID, spiro 25 - C/w corlanor, monitor HR closely - c/w spiro 25, torsemide 60 - Continue to hold home farxiga due to low normotensive Bps, may need to add and reduce torsemide dose to 55 - Was trialed on entresto March 2019 but did not tolerate due to hyperkalemia then trial on ARB but low normotensive Bps outpatient did not continue ARB.    3. Renal insufficiency - Jump in Cr from 1.03 to 1.40 on 11/12, held torsemide - Today Cr 1.07 - Torsemide resumed on 11/14, continue to monitor   4. COPD  -Known asthmatic/former smoker -Per primary team   5. CAD -NSTEMI/STEMI s/p Emergent CABG 02/03/18: Cath with severe 2v CAD as above with severe LV dysfunction/ICM. s/p Emergent CABG 02/03/18.   - Had NSTEMI and now s/p LHC 02/12/19 with DES to the ostial ramus extending into the distal left main - HS trop 21>21  - Off aspirin. Can stay off brillinta given DES back in 02/2019.  - Continue high intensity statin.    6. H/O SCLC s/p treatment 2015 - Had  chemo/radiation - Followed by Dr [name]   7. PAF  -In NSR today  -No A/C 2/2 hemoptysis   Length of Stay: [place], NP  10/25/2020, 7:18 AM  Advanced Heart Failure Team Pager [phone] (M-F; 7a - 4p)  Please contact [facility] for night-coverage after hours (4p -7a ) and weekends on [email or web address]  Patient seen and examined with the above-signed Advanced Practice Provider and/or  Housestaff. I personally reviewed laboratory data, imaging studies and relevant notes. I independently examined the patient and formulated the important aspects of the plan. I have edited the note to reflect any of my changes or salient points. I have personally discussed the plan with the patient and/or family.  S/p bronch today with normal airways. Transbronchial biopsies taken of right hilar region.   Feels ok, Volume status stable. No CP.   General: Lying in bed . No resp difficulty HEENT: normal Neck: supple. no JVD. Carotids 2+ bilat; no bruits. No lymphadenopathy or thryomegaly appreciated. Cor: PMI nondisplaced. Regular rate & rhythm. No rubs, gallops or murmurs. Lungs: decreased throughout Abdomen: soft, nontender, nondistended. No hepatosplenomegaly. No bruits or masses. Good bowel sounds. Extremities: no cyanosis, clubbing, rash, edema Neuro: alert & orientedx3, cranial nerves grossly intact. moves all 4 extremities w/o difficulty. Affect pleasant  Stable from HF and CAD standpoint. Continue spiro and torsemide. Restart Farxiga in am. Corlanor has been on hold due to low HRs. Will follow.   Glori Bickers, MD  12:14 PM

## 2020-10-25 NOTE — Anesthesia Postprocedure Evaluation (Signed)
Anesthesia Post Note  Patient: Brittany [name]  Procedure(s) Performed: ENDOBRONCHIAL ULTRASOUND (N/A ) FLEXIBLE BRONCHOSCOPY BRONCHIAL NEEDLE ASPIRATION BIOPSIES BRONCHIAL BRUSHINGS     Patient location during evaluation: Endoscopy Anesthesia Type: General Level of consciousness: awake and alert Pain management: pain level controlled Vital Signs Assessment: post-procedure vital signs reviewed and stable Respiratory status: spontaneous breathing, nonlabored ventilation and respiratory function stable Cardiovascular status: blood pressure returned to baseline and stable Postop Assessment: no apparent nausea or vomiting Anesthetic complications: no   No complications documented.  Last Vitals:  Vitals:   10/25/20 0858 10/25/20 0951  BP: (!) 103/59 100/63  Pulse: 77 69  Resp: 15   Temp:    SpO2: 91% 100%    Last Pain:  Vitals:   10/25/20 0858  TempSrc:   PainSc: 0-No pain                 [name],W. EDMOND

## 2020-10-25 NOTE — Transfer of Care (Signed)
Immediate Anesthesia Transfer of Care Note  Patient: Brittany [name]  Procedure(s) Performed: ENDOBRONCHIAL ULTRASOUND (N/A ) FLEXIBLE BRONCHOSCOPY BRONCHIAL NEEDLE ASPIRATION BIOPSIES BRONCHIAL BRUSHINGS  Patient Location: PACU and Endoscopy Unit  Anesthesia Type:General  Level of Consciousness: drowsy, patient cooperative and responds to stimulation  Airway & Oxygen Therapy: Patient Spontanous Breathing  Post-op Assessment: Report given to RN and Post -op Vital signs reviewed and stable  Post vital signs: Reviewed and stable  Last Vitals:  Vitals Value Taken Time  BP 114/63 10/25/20 0838  Temp    Pulse 90 10/25/20 0838  Resp 20 10/25/20 0838  SpO2 99 % 10/25/20 0838  Vitals shown include unvalidated device data.  Last Pain:  Vitals:   10/25/20 0655  TempSrc: Oral  PainSc: 0-No pain      Patients Stated Pain Goal: 0 (09/81/19 1478)  Complications: No complications documented.

## 2020-10-25 NOTE — Progress Notes (Signed)
Physical Therapy Treatment Patient Details Name: Brittany [name] MRN: [id number] DOB: Nov 02, 1961 Today's Date: 10/25/2020    History of Present Illness Pt is a 58 year old woman admitted on 10/21/20 with hemoptysis,  worsening cough, 3lb weight gain and shortness of breath. Pt required bipap. PMH: CAD, NSTEMI, CABG, COPD, advanced CHF, small cell lung cancer 7 years ago, DM2.     PT Comments    Pt greeted in halls with OT and PTA took over gt training and rollator education.  Pt is tolerating activity well and mindful when rest breaks are needed. Supervision for safety with use of rollator as the device is new to her.  Pt able to recall and teach back method.  Plan for return home with no PT follow up.      Follow Up Recommendations  No PT follow up     Equipment Recommendations  Other (comment) (rollator ( 4 wheeled walker with a seat ))    Recommendations for Other Services       Precautions / Restrictions Precautions Precautions: Fall Restrictions Weight Bearing Restrictions: No    Mobility  Bed Mobility Overal bed mobility: Independent                Transfers Overall transfer level: Needs assistance Equipment used: None;4-wheeled walker Transfers: Sit to/from Stand Sit to Stand: Modified independent (Device/Increase time);Supervision         General transfer comment: Moved from bed to recliner without device.  She did however require cues for locking rollator brakes to take a rest in halls on rollator.  Educated to back rollator to a firm surface and lock brakes before sitting.  Ambulation/Gait Ambulation/Gait assistance: Supervision Gait Distance (Feet): 100 Feet (x2 trials.) Assistive device: 4-wheeled walker Gait Pattern/deviations: Step-through pattern;Decreased stride length Gait velocity: decr   General Gait Details: supervision for safety   Stairs             Wheelchair Mobility    Modified Rankin (Stroke Patients Only)        Balance Overall balance assessment: Needs assistance Sitting-balance support: No upper extremity supported;Feet supported Sitting balance-Leahy Scale: Good     Standing balance support: No upper extremity supported;During functional activity Standing balance-Leahy Scale: Fair                              Cognition Arousal/Alertness: Awake/alert Behavior During Therapy: WFL for tasks assessed/performed Overall Cognitive Status: Within Functional Limits for tasks assessed                                        Exercises      General Comments General comments (skin integrity, edema, etc.): (P) SpO2 97-89% on RA      Pertinent Vitals/Pain Pain Assessment: No/denies pain    Home Living                      Prior Function            PT Goals (current goals can now be found in the care plan section) Acute Rehab PT Goals Patient Stated Goal: return home Potential to Achieve Goals: Good Progress towards PT goals: Progressing toward goals    Frequency    Min 3X/week      PT Plan Current plan remains appropriate    Co-evaluation  AM-PAC PT "6 Clicks" Mobility   Outcome Measure  Help needed turning from your back to your side while in a flat bed without using bedrails?: None Help needed moving from lying on your back to sitting on the side of a flat bed without using bedrails?: None Help needed moving to and from a bed to a chair (including a wheelchair)?: A Little Help needed standing up from a chair using your arms (e.g., wheelchair or bedside chair)?: A Little Help needed to walk in hospital room?: A Little Help needed climbing 3-5 steps with a railing? : A Little 6 Click Score: 20    End of Session Equipment Utilized During Treatment: Gait belt Activity Tolerance: Patient tolerated treatment well Patient left: with call bell/phone within reach;in chair Nurse Communication: Mobility status PT  Visit Diagnosis: Muscle weakness (generalized) (M62.81);Other abnormalities of gait and mobility (R26.89)     Time: 4010-2725 PT Time Calculation (min) (ACUTE ONLY): 16 min  Charges:  $Gait Training: 8-22 mins                     Erasmo Leventhal , PTA Acute Rehabilitation Services Pager [phone] Office [place] 10/25/2020, 4:53 PM

## 2020-10-25 NOTE — Op Note (Signed)
[facility] Cardiopulmonary Patient Name: Brittany [name] Pocedure Date: 10/25/2020 MRN: [id number] Attending MD: Collene Gobble , MD Date of Birth: May 18, 1961 CSN: Finalized Age: 59 Admit Type: Inpatient Gender: Female Procedure:             Bronchoscopy Indications:           Hemoptysis with abnormal CXR Providers:             Collene Gobble, MD, Erenest Rasher, RN, Elspeth Cho                         Tech., Technician, Lesia Sago, Technician, Cira Servant, CRNA Referring MD:           Medicines:              Complications:         No immediate complications Estimated Blood Loss:  Estimated blood loss was minimal. Procedure:             Pre-Anesthesia Assessment:                        - A History and Physical has been performed. Patient                         meds and allergies have been reviewed. The risks and                         benefits of the procedure and the sedation options and                         risks were discussed with the patient. All questions                         were answered and informed consent was obtained.                         Patient identification and proposed procedure were                         verified prior to the procedure by the physician in                         the pre-procedure area. Mental Status Examination:                         alert and oriented. Airway Examination: normal                         oropharyngeal airway. Respiratory Examination: clear                         to auscultation. CV Examination: regular rate and                         rhythm. Prior Anticoagulants: The patient has taken  anticoagulant medication, last dose was 6 days prior                         to procedure. ASA Grade Assessment: II - A patient                         with mild systemic disease. After reviewing the risks                         and benefits, the  patient was deemed in satisfactory                         condition to undergo the procedure. The anesthesia                         plan was to use general anesthesia. Immediately prior                         to administration of medications, the patient was                         re-assessed for adequacy to receive sedatives. The                         heart rate, respiratory rate, oxygen saturations,                         blood pressure, adequacy of pulmonary ventilation, and                         response to care were monitored throughout the                         procedure. The physical status of the patient was                         re-assessed after the procedure.                        After obtaining informed consent, the bronchoscope was                         passed under direct vision. Throughout the procedure,                         the patient's blood pressure, pulse, and oxygen                         saturations were monitored continuously. the BF-H190                         ([id number]) Olympus Diagnostic Bronchoscope was                         introduced through the mouth, via the endotracheal                         tube (the patient was intubated for the procedure) and  advanced to the tracheobronchial tree. the BF-[id number]                         ([id number]) Olympus EBUS scope was introduced through                         the and advanced to the. The procedure was                         accomplished without difficulty. The patient tolerated                         the procedure well. Scope In: Scope Out: Findings:      The endotracheal tube is in good position. The visualized portion of the       trachea is of normal caliber. The carina is sharp. The tracheobronchial       tree was examined to at least the first subsegmental level. Bronchial       mucosa was hy[pervascular but no obvious endobronchial lesions or source       of  bleeding; there are no endobronchial lesions, and no secretions.       There was a prominence noted of smooth mucosa noted at the supreior       aspect of the RUL airway.      Lymph Nodes: EBUS inspection showed no nodes at 4R, 4L, 7 or distally.       There was an area of irregularly shaped soft tissue at station 10R.       Transbronchial biopsies of the right hilar region (level 10R) were       performed using a 21 gauge needle and sent for routine cytology. The       procedure was guided by ultrasound. Four biopsy passes were performed.       Four biopsy samples were obtained.      Brushings of a lesion were obtained in the right upper lobe with a       cytology brush and sent for routine cytology. Two samples were obtained. Impression:            - Hemoptysis with abnormal CXR                        - The airway examination was normal.                        - Transbronchial lung biopsies were performed. Moderate Sedation:      Performed under general anesthesia Recommendation:        - Await cytology results. Procedure Code(s):     --- Professional ---                        [phone], Bronchoscopy, rigid or flexible, including                         fluoroscopic guidance, when performed; with                         endobronchial ultrasound (EBUS) guided transtracheal                         and/or transbronchial sampling (eg,  aspiration[s]/biopsy[ies]), one or two mediastinal                         and/or hilar lymph node stations or structures                        [id number], Bronchoscopy, rigid or flexible, including                         fluoroscopic guidance, when performed; with brushing                         or protected brushings Diagnosis Code(s):     --- Professional ---                        R04.2, Hemoptysis                        R91.8, Other nonspecific abnormal finding of lung field CPT copyright 2019 [employer]. All rights  reserved. The codes documented in this report are preliminary and upon coder review may  be revised to meet current compliance requirements. Collene Gobble, MD Collene Gobble, MD 10/25/2020 8:39:02 AM Number of Addenda: 0

## 2020-10-26 ENCOUNTER — Telehealth: Payer: Self-pay | Admitting: Pulmonary Disease

## 2020-10-26 ENCOUNTER — Encounter (HOSPITAL_COMMUNITY): Payer: Self-pay | Admitting: Emergency Medicine

## 2020-10-26 DIAGNOSIS — R042 Hemoptysis: Secondary | ICD-10-CM | POA: Diagnosis not present

## 2020-10-26 LAB — CYTOLOGY - NON PAP

## 2020-10-26 LAB — BASIC METABOLIC PANEL
Anion gap: 11 (ref 5–15)
BUN: 45 mg/dL — ABNORMAL HIGH (ref 6–20)
CO2: 27 mmol/L (ref 22–32)
Calcium: 9.9 mg/dL (ref 8.9–10.3)
Chloride: 99 mmol/L (ref 98–111)
Creatinine, Ser: 1.28 mg/dL — ABNORMAL HIGH (ref 0.44–1.00)
GFR, Estimated: 48 mL/min — ABNORMAL LOW (ref >60)
Glucose, Bld: 113 mg/dL — ABNORMAL HIGH (ref 70–99)
Potassium: 4.4 mmol/L (ref 3.5–5.1)
Sodium: 137 mmol/L (ref 135–145)

## 2020-10-26 LAB — GLUCOSE, CAPILLARY
Glucose-Capillary: 110 mg/dL — ABNORMAL HIGH (ref 70–99)
Glucose-Capillary: 117 mg/dL — ABNORMAL HIGH (ref 70–99)
Glucose-Capillary: 125 mg/dL — ABNORMAL HIGH (ref 70–99)
Glucose-Capillary: 166 mg/dL — ABNORMAL HIGH (ref 70–99)

## 2020-10-26 MED ORDER — TORSEMIDE 20 MG PO TABS
40.0000 mg | ORAL_TABLET | Freq: Every day | ORAL | Status: DC
  Administered 2020-10-26: 40 mg via ORAL
  Filled 2020-10-26: qty 2

## 2020-10-26 MED ORDER — ENSURE ENLIVE PO LIQD
237.0000 mL | Freq: Three times a day (TID) | ORAL | 12 refills | Status: DC
Start: 2020-10-26 — End: 2021-05-11

## 2020-10-26 MED ORDER — DAPAGLIFLOZIN PROPANEDIOL 10 MG PO TABS: 10.0000 mg | ORAL_TABLET | Freq: Every day | ORAL | Status: DC

## 2020-10-26 MED ORDER — TORSEMIDE 20 MG PO TABS
ORAL_TABLET | ORAL | 3 refills | Status: DC
Start: 2020-10-26 — End: 2020-11-10

## 2020-10-26 NOTE — Progress Notes (Signed)
D/C instructions given and reviewed. No questions asked but encouraged to call with any concerns. Tele and IV removed, tolerated well. 

## 2020-10-26 NOTE — Progress Notes (Signed)
NAME:  Brittany [name], MRN:  [id number], DOB:  [birth year], LOS: 5 ADMISSION DATE:  10/20/2020, CONSULTATION DATE: 10/21/2020 REFERRING MD: Dr. Alcario Drought, CHIEF COMPLAINT: Hemoptysis  Brief History   59 year old female presents [facility] with 3-day history of frank mopped assist.  Imaging essentially negative, however, hemoptysis persists.  PCCM consulted for consideration of bronchoscopy.  History of present illness   59 year old female with past medical history as below, which is significant for small cell lung cancer in remission for 6 years, coronary artery disease status post CABG and multiple PCI events, heart failure with reduced ejection fraction 25%, diabetes mellitus, asthma, and paroxysmal atrial fibrillation not on anticoagulation.  She was in her usual state of health until approximately 11/7 when she developed hemoptysis and mild shortness of breath.  She was convinced that this was likely CHF exacerbation as she has experienced many times before.  Symptoms did not improve with increasing home medications, which caused her to present most emergency department 11/10.  She did note a weight gain of 3 pounds in the preceding 24 hours with associated orthopnea.  There was concern for CHF exacerbation immediately upon arrival to the emergency department.  She was administered IV Lasix with minimal urine output or improvement in her symptoms.  Chest x-ray and CT angiogram did not demonstrate any acute illness.  Despite this her dyspnea continued to worsen.  She was not truly hypoxic even on room air, however, work of breathing increased significantly to the point she was placed on BiPAP at which point PCCM was consulted.  Past Medical History  . has a past medical history of Acute systolic congestive heart failure ([place]) (02/03/2018), Allergy, Anxiety, Asthma, DM2 (diabetes mellitus, type 2) ([place]) (10/20/2020), Hypertension, PAF (paroxysmal atrial fibrillation) ([place]), Prophylactic  measure (08/03/14-08/19/14), S/P emergency CABG x 3 (02/03/2018), and Small cell lung cancer ([place]) (03/16/2014).   Significant Hospital Events   11/10 admit 11/15 EBUS performed  Consults:  PCCM  Procedures:  11/15 EBUS performed with TBNA of 10R LN station and brushings of RUL  Significant Diagnostic Tests:  CT angiogram chest 11/10: No evidence of pulmonary embolus, stable post therapeutic changes of the right hilum for prior radiation therapy, and cardiomegaly.  Micro Data:    Antimicrobials:   Doxycycline 11/11>>  Interim history/subjective:  Patient feeling well this morning. No further episodes of hemoptysis. No shortness of breath or wheezing.   Objective   Blood pressure (!) 97/57, pulse 63, temperature 98.4 F (36.9 C), temperature source Oral, resp. rate 16, height 4\' 11"  (1.499 m), weight 47.7 kg, SpO2 98 %.        Intake/Output Summary (Last 24 hours) at 10/26/2020 1115 Last data filed at 10/26/2020 0806 Gross per 24 hour  Intake 840 ml  Output --  Net 840 ml   Filed Weights   10/25/20 0002 10/25/20 0655 10/26/20 0631  Weight: 48.3 kg 48.3 kg 47.7 kg    Examination: General: Middle-aged female, no acute distress HENT: Normocephalic, atraumatic, PERRL, no JVD Lungs: clear to auscultation. No wheezing. Cardiovascular: RRR, no MRG Abdomen: Soft, non-tender, non-distended Extremities: No acute deformity or ROM limitation. No edema.  Neuro: Alert, oriented, non-focal.    Resolved Hospital Problem list     Assessment & Plan:   Hemoptysis In setting of acute on chronic heart failure with aspirin and brillanta and hypervascular mucosa noted on bronchoscopy. Other etiologies include infectious or malignancy.  - Complete 7 day course of doxycycline - S/p EBUS with TBNA and brushings on  11/15. Will follow up results and call patient. - Will remain off brillinta per cardiology as DES back in 02/2019. Restart aspirin per cardiology.   Asthma Exacerbation -  Improved with prednisone and nebulizer treatments - Continue nebulizer treatments and albuterol use as needed  Will schedule her for follow up in pulmonary clinic with Dr. Lamonte Sakai or myself for asthma and monitoring of her hemoptysis.  Labs   CBC: Recent Labs  Lab 10/20/20 1702 10/20/20 1714 10/22/20 0435 10/23/20 0421 10/24/20 0135  WBC 7.3  --  12.7* 11.0* 9.8  NEUTROABS 5.3  --  11.4* 8.7* 7.9*  HGB 15.9* 17.7*  18.0* 14.8 13.5 12.9  HCT 50.2* 52.0*  53.0* 47.2* 42.7 40.2  MCV 94.2  --  96.5 95.5 95.9  PLT 205  --  176 173 270    Basic Metabolic Panel: Recent Labs  Lab 10/22/20 0435 10/23/20 0421 10/24/20 0135 10/25/20 0632 10/26/20 0501  NA 138 137 139 139 137  K 4.8 4.2 4.2 4.0 4.4  CL 102 104 104 101 99  CO2 21* 25 26 28 27   GLUCOSE 157* 103* 135* 100* 113*  BUN 34* 41* 35* 31* 45*  CREATININE 1.40* 1.16* 1.02* 1.07* 1.28*  CALCIUM 9.9 9.9 10.1 9.4 9.9   GFR: Estimated Creatinine Clearance: 31.9 mL/min (A) (by C-G formula based on SCr of 1.28 mg/dL (H)). Recent Labs  Lab 10/20/20 1702 10/22/20 0435 10/23/20 0421 10/24/20 0135  WBC 7.3 12.7* 11.0* 9.8    Liver Function Tests: Recent Labs  Lab 10/20/20 1702  AST 23  ALT 15  ALKPHOS 90  BILITOT 1.5*  PROT 7.6  ALBUMIN 4.2   No results for input(s): LIPASE, AMYLASE in the last 168 hours. No results for input(s): AMMONIA in the last 168 hours.  ABG    Component Value Date/Time   PHART 7.392 04/19/2018 1857   PCO2ART 35.3 04/19/2018 1857   PO2ART 110.0 (H) 04/19/2018 1857   HCO3 23.4 10/21/2020 0607   TCO2 28 10/20/2020 1714   TCO2 33 (H) 10/20/2020 1714   ACIDBASEDEF 0.9 10/21/2020 0607   O2SAT 97.5 10/21/2020 0607     Coagulation Profile: No results for input(s): INR, PROTIME in the last 168 hours.  Cardiac Enzymes: No results for input(s): CKTOTAL, CKMB, CKMBINDEX, TROPONINI in the last 168 hours.  HbA1C: Hgb A1c MFr Bld  Date/Time Value Ref Range Status  10/21/2020 06:25 AM 6.0  (H) 4.8 - 5.6 % Final    Comment:    (NOTE) Pre diabetes:          5.7%-6.4%  Diabetes:              >6.4%  Glycemic control for   <7.0% adults with diabetes   04/22/2018 07:34 AM 5.8 (H) 4.8 - 5.6 % Final    Comment:    (NOTE) Pre diabetes:          5.7%-6.4% Diabetes:              >6.4% Glycemic control for   <7.0% adults with diabetes     CBG: Recent Labs  Lab 10/25/20 1633 10/25/20 2051 10/26/20 0048 10/26/20 0615 10/26/20 0733  GLUCAP 171* 237* [place]    Freda Jackson, MD [facility] Office: [phone]   See Amion for Pager Details

## 2020-10-26 NOTE — Progress Notes (Signed)
No ICM remote transmission received for 10/26/2020 due to patient currently hospitalized and next ICM transmission scheduled for 11/01/2020.

## 2020-10-26 NOTE — Telephone Encounter (Signed)
This has been scheduled for the pt to see JD on 12/14 at 10 am.

## 2020-10-26 NOTE — Discharge Instructions (Signed)
Follow up in pulmonary clinic for asthma and monitoring of her hemoptysis (coughing up blood)  Heart Failure Medications for discharge Torsemide 40 mg daily Farxiga 10 mg daily  Spironolactone 25 mg daily  Ivabradine 7.5 mg twice a day  Atorvastatin 80 mg daily  Do not take aspirin or brilinta any longer. We stopped these since you were having blood when you coughed.  Dr. [name] and his team will arrange follow up on  11/08/20

## 2020-10-26 NOTE — Plan of Care (Signed)
°  Problem: Education: Goal: Ability to demonstrate management of disease process will improve Outcome: Adequate for Discharge Goal: Ability to verbalize understanding of medication therapies will improve Outcome: Adequate for Discharge Goal: Individualized Educational Video(s) Outcome: Adequate for Discharge   Problem: Activity: Goal: Capacity to carry out activities will improve Outcome: Adequate for Discharge   Problem: Cardiac: Goal: Ability to achieve and maintain adequate cardiopulmonary perfusion will improve Outcome: Adequate for Discharge   Problem: Education: Goal: Knowledge of disease or condition will improve Outcome: Adequate for Discharge Goal: Knowledge of the prescribed therapeutic regimen will improve Outcome: Adequate for Discharge Goal: Individualized Educational Video(s) Outcome: Adequate for Discharge   Problem: Activity: Goal: Ability to tolerate increased activity will improve Outcome: Adequate for Discharge Goal: Will verbalize the importance of balancing activity with adequate rest periods Outcome: Adequate for Discharge   Problem: Respiratory: Goal: Ability to maintain a clear airway will improve Outcome: Adequate for Discharge Goal: Levels of oxygenation will improve Outcome: Adequate for Discharge Goal: Ability to maintain adequate ventilation will improve Outcome: Adequate for Discharge

## 2020-10-26 NOTE — Progress Notes (Addendum)
Advanced Heart Failure Rounding Note  PCP-Cardiologist: No primary care provider on file.   Subjective:    Admitted Oct 21, 2020 for hemoptysis under Hospitalist services and CCM consulted with plan to bronch after washout from [place].    10/25/20 S/P Bronch with normal airways.Transbronchial biopsies taken of right hilar region.    Feels better. Wants to go home.   Objective:   Weight Range: 47.7 kg Body mass index is 21.25 kg/m.   Vital Signs:   Temp:  [97.6 F (36.4 C)-98.4 F (36.9 C)] 98.4 F (36.9 C) (11/16 0631) Pulse Rate:  [63-88] 63 (11/16 0631) Resp:  [15-20] 16 (11/16 0631) BP: (97-114)/(57-64) 97/57 (11/16 0631) SpO2:  [91 %-100 %] 98 % (11/16 0631) Weight:  [47.7 kg] 47.7 kg (11/16 0631) Last BM Date: 10/25/20  Weight change: Filed Weights   10/25/20 0002 10/25/20 0655 10/26/20 0631  Weight: 48.3 kg 48.3 kg 47.7 kg    Intake/Output:   Intake/Output Summary (Last 24 hours) at 10/26/2020 0738 Last data filed at 10/25/2020 2200 Gross per 24 hour  Intake 560 ml  Output 20 ml  Net 540 ml      Physical Exam    General:  Thin. Well appearing. No resp difficulty HEENT: normal Neck: supple. no JVD. Carotids 2+ bilat; no bruits. No lymphadenopathy or thryomegaly appreciated. Cor: PMI nondisplaced. Regular rate & rhythm. No rubs, gallops or murmurs. Lungs: clear on room air.  Abdomen: soft, nontender, nondistended. No hepatosplenomegaly. No bruits or masses. Good bowel sounds. Extremities: no cyanosis, clubbing, rash, edema Neuro: alert & orientedx3, cranial nerves grossly intact. moves all 4 extremities w/o difficulty. Affect pleasant\   Telemetry  SR 60-70s personally reviewed. .    EKG    SR on EKG 10/21/2020.   Labs    CBC Recent Labs    10/24/20 0135  WBC 9.8  NEUTROABS 7.9*  HGB 12.9  HCT 40.2  MCV 95.9  PLT 638   Basic Metabolic Panel Recent Labs    10/25/20 0632 10/26/20 0501  NA 139 137  K 4.0 4.4  CL 101 99    CO2 28 27  GLUCOSE 100* 113*  BUN 31* 45*  CREATININE 1.07* 1.28*  CALCIUM 9.4 9.9   Liver Function Tests No results for input(s): AST, ALT, ALKPHOS, BILITOT, PROT, ALBUMIN in the last 72 hours. No results for input(s): LIPASE, AMYLASE in the last 72 hours. Cardiac Enzymes No results for input(s): CKTOTAL, CKMB, CKMBINDEX, TROPONINI in the last 72 hours.  BNP: BNP (last 3 results) Recent Labs    05/21/20 1102 10/20/20 1702  BNP 896.5* 1,118.4*    ProBNP (last 3 results) No results for input(s): PROBNP in the last 8760 hours.   D-Dimer No results for input(s): DDIMER in the last 72 hours. Hemoglobin A1C No results for input(s): HGBA1C in the last 72 hours. Fasting Lipid Panel No results for input(s): CHOL, HDL, LDLCALC, TRIG, CHOLHDL, LDLDIRECT in the last 72 hours. Thyroid Function Tests No results for input(s): TSH, T4TOTAL, T3FREE, THYROIDAB in the last 72 hours.  Invalid input(s): FREET3  Other results:   Imaging    No results found.   Medications:     Scheduled Medications: . atorvastatin  80 mg Oral q1800  . benzonatate  100 mg Oral BID  . doxycycline  100 mg Oral Q12H  . feeding supplement  237 mL Oral TID BM  . insulin aspart  0-9 Units Subcutaneous Q4H  . ivabradine  7.5 mg Oral BID  WC  . senna-docusate  2 tablet Oral BID  . sodium chloride flush  3 mL Intravenous Q12H  . spironolactone  25 mg Oral Daily  . torsemide  60 mg Oral Daily    Infusions: . sodium chloride    . lactated ringers 10 mL/hr at 10/25/20 0704    PRN Medications: sodium chloride, acetaminophen, albuterol, ALPRAZolam, ipratropium-albuterol, ondansetron (ZOFRAN) IV, sodium chloride flush    Patient Profile   Brittany [name] is a 59 year old with a history of PAF, previous smoker, HTN, COPD, small cell lung cancer treated with chemo, radiation chest/brain, CABG, and chronic systolic heart failure. Admitted on 10/21/2020 for hemoptysis.    Assessment/Plan   1.  Hemoptysis - Off asa and brillinta.   - CCM following.  - S/P Bronch with normal airways. Transbronchial biopsies taken of right hilar region.   - Completed prednisone. - Continue doxycyline per primary team.    2. A/C Systolic Heart Failure, ICM  -Echo 02/12/19: EF 25-30%, RV mildly reduced -TTE 10/21/20: EF 20-25%, global hypokinesis, grade IIDD, mod AR, ? L-R shunt - On admission: Reds Clip 47% suggestive of volume overload w/ elevated JVP. BNP >10000.  - Home: torsemide 60, farxiga 10, corlanor 7.5 BID, spiro 25 - Volume status stable. Cut back torsemide to 40 mg daily - Restart farxiga today.   - Continue spironolactone 25 mg daily  - Continue ivabradine 7.5 mg twice a day.  - Was trialed on entresto March 2019 but did not tolerate due to hyperkalemia then trial on ARB but low normotensive Bps outpatient did not continue ARB.    3. Renal insufficiency - Torsemide resumed on 11/14, continue to monitor  - Creatinine baseline ~1.2. Creatinine 1.3 today.  4. COPD  - On room air with stable sats.  -Known asthmatic/former smoker -Per primary team   5. CAD -NSTEMI/STEMI s/p Emergent CABG 02/03/18: Cath with severe 2v CAD as above with severe LV dysfunction/ICM. s/p Emergent CABG 02/03/18.   - Had NSTEMI and now s/p LHC 02/12/19 with DES to the ostial ramus extending into the distal left main - HS trop 21>21  - No chest pain.  - Off aspirin. Can stay off brillinta given DES back in 02/2019.  - Continue high intensity statin.    6. H/O SCLC s/p treatment 2015 - Had chemo/radiation - Followed by Dr [name]   7. PAF  - Maintaining NSR -No A/C 2/2 hemoptysis  Stable from HF perspective. OK for d/c. We will set up HF follow up--> 11/08/20 at 300   HF meds for D/C Torsemide 40 mg daily Farxiga 10 mg daily  Spironolactone 25 mg daily  Ivabradine 7.5 mg twice a day  Atorvastatin 80 mg daily.    Length of Stay: 5  Amy Clegg, NP  10/26/2020, 7:38 AM  Advanced Heart Failure  Team Pager [phone] (M-F; 7a - 4p)  Please contact [facility] for night-coverage after hours (4p -7a ) and weekends on [email or web address]   Patient seen and examined with the above-signed Advanced Practice Provider and/or Housestaff. I personally reviewed laboratory data, imaging studies and relevant notes. I independently examined the patient and formulated the important aspects of the plan. I have edited the note to reflect any of my changes or salient points. I have personally discussed the plan with the patient and/or family.  Tolerated bronch well. No further hemoptysis. COPD flare improved. Stable from HF and CAD perspective.   General:  Lying in bed . No resp difficulty HEENT: normal  Neck: supple. no JVD. Carotids 2+ bilat; no bruits. No lymphadenopathy or thryomegaly appreciated. Cor: PMI nondisplaced. Regular rate & rhythm. No rubs, gallops or murmurs. Lungs: clear Abdomen: soft, nontender, nondistended. No hepatosplenomegaly. No bruits or masses. Good bowel sounds. Extremities: no cyanosis, clubbing, rash, edema Neuro: alert & orientedx3, cranial nerves grossly intact. moves all 4 extremities w/o difficulty. Affect pleasant  Much improved. Volume status better. COPD flare and hemoptysis resolved. Can go home from our perspective on HF meds as listed above. Will arrange f/u in HF Clinic.   Glori Bickers, MD  8:07 AM

## 2020-10-26 NOTE — Progress Notes (Signed)
PT Cancellation Note  Patient Details Name: Brittany [name] MRN: [id number] DOB: July 23, 1961   Cancelled Treatment:    Reason Eval/Treat Not Completed: Other (comment) Pt declined therapy services due to pending d/c. Has no further questions/concerns regarding mobility.   Wyona Almas, PT, DPT Acute Rehabilitation Services Pager [phone] Office [phone]    Deno Etienne 10/26/2020, 2:07 PM

## 2020-10-26 NOTE — Discharge Summary (Signed)
Brittany [name] PPI:[id number] DOB: Oct 07, 1961 DOA: 10/20/2020  PCP: Maude Leriche, PA-C  Admit date: 10/20/2020 Discharge date: 10/26/2020  Admitted From: home Disposition:  home  Recommendations for Outpatient Follow-up:  1. Follow up with PCP in 1-2 weeks 2. Outpatient follow up to be arranged with Cardiology(HF clinici) and Pulmonology ( in 3-4 weeks for hemoptysis and asthma/COPD) 3. Discontinued aspirin and brilinta due to hemoptysis 4. Torsemide reduced to 40 mg per cardiology  Home Health:none  Equipment/Devices:rolling walker  Discharge [facility] Course:   [name] is a 59 year old female with medical history significant for not limited to paroxysmal atrial fibrillation, COPD, SCLC treated with chemo, previous medical, chronic systolic CHF who presents with 2 days of worsening cough associated with hemoptysis with large blood clots, and new need for supplemental O2 at 2 L nasal cannula at home as well as 3 pound weight gain.  She was found to have evidence of emphysema on CTA but no PE, elevated BNP patient was admitted with working diagnosis of acute hypoxic respiratory failure presumed secondary to combined acute on chronic systolic CHF exacerbation, acute exacerbation of COPD.  Hospital course complicated significant increased work of breathing requiring BiPAP initially  Acute COPD exacerbation resolved.  Wheezing resolved, on room air, no recurrent BiPAP required.  CHF exacerbation likely also contributed.   Typically at home does not require oxygen continuously.  Not requiring oxygen with ambulation in halls. Completed course of 5 day course of doxycycline and 3 day burst of predinose --Continue CHF home regimen, back on torsemide --Continue neb treatments and albuterol as needed on discharge - flutter valve, incentive spirometry  Acute on chronic systolic CHF with history of ischemic cardiomyopathy,  resolved.  TTE on 11/11 with EF 20-25%, and global hypokinesis  Elevated BNP on arrival, complains of orthopnea, PND with worsening cough and dyspnea on O2 requirements.  Net -1 L this hospital stay with IV diuresis.  - per heart failure team will continue torsemide at reduced dose of 40 mg due to chronic hypotension -Continue farxiga 10 mg qd, spironolactone 25 mg qd. Ivabradine 7.5 mg BID,   Hemoptysis, stable.  Could be related to acute exacerbation of COPD.  CTA showed stable scarring in the right hilum consistent with emphysema.  Patient does have history of NSCLC some concern.  Underwent bronchoscopy on 11/15, no obvious source of bleeding found on examination.  -Follow-up transbronchial biopsy pathology per pulmonology who will follow up with patient on results -Aspirin and brilinta discontinued by cardiology on hospitalization and will continue on discharge -Tessalon Perles, - warm Compress for rib cage pain  Hyperlipidemia, stable on -Home Lipitor    HF meds for D/C Torsemide 40 mg daily Farxiga 10 mg daily  Spironolactone 25 mg daily  Ivabradine 7.5 mg twice a day  Atorvastatin 80 mg daily.    Consultations:  Cardiology, PCCM  Procedures/Studies: TTE  10/21/20  1. Left ventricular ejection fraction, by estimation, is 20 to 25%. The  left ventricle has severely decreased function. The left ventricle  demonstrates global hypokinesis. The left ventricular internal cavity size  was mildly dilated. Left ventricular  diastolic parameters are consistent with Grade II diastolic dysfunction  (pseudonormalization).  2. Right ventricular systolic function is mildly reduced. The right  ventricular size is normal. There is normal pulmonary artery systolic  pressure.  3. Left atrial size was mild to moderately dilated.  4. The mitral valve is normal in structure. Trivial mitral valve  regurgitation. No evidence of mitral stenosis.  5. The aortic valve is tricuspid.  Aortic valve regurgitation is moderate.  PHT 268 msec. Mild aortic valve sclerosis is present, with no evidence of  aortic valve stenosis.  6. Possible PFO versus small ASD with left to right shunting noted on  apical images.  7. The inferior vena cava is normal in size with greater than 50%  respiratory variability, suggesting right atrial pressure of 3 mmHg.   Bronchoscopy on 10/25/20  - Hemoptysis with abnormal CXR - The airway examination was normal. - Transbronchial lung biopsies were performed.   Subjective:  Discharge Exam: Vitals:   10/26/20 0631 10/26/20 1121  BP: (!) 97/57 111/73  Pulse: 63 71  Resp: 16 17  Temp: 98.4 F (36.9 C) 98.2 F (36.8 C)  SpO2: 98% 100%   Vitals:   10/25/20 0951 10/25/20 2043 10/26/20 0631 10/26/20 1121  BP: 100/63 107/64 (!) 97/57 111/73  Pulse: 69 65 63 71  Resp:  20 16 17   Temp:  98.2 F (36.8 C) 98.4 F (36.9 C) 98.2 F (36.8 C)  TempSrc:  Oral Oral Oral  SpO2: 100% 95% 98% 100%  Weight:   47.7 kg   Height:        Thin female, in no acute distress Awake Alert, Oriented X 3, Normal affect No new F.N deficits,  [place].AT, Normal respiratory effort on room air, no wheezing or crackles  no edema  Discharge Diagnoses:  Principal Problem:   Hemoptysis Active Problems:   HTN (hypertension)   Atrial fibrillation (HCC)   Small cell lung cancer (HCC)   Chronic respiratory failure with hypoxia (HCC)   Acute on chronic systolic and diastolic heart failure, NYHA class 3 (HCC)   DM2 (diabetes mellitus, type 2) (HCC)   COPD with acute exacerbation [facility])    Discharge Instructions  Discharge Instructions    Diet - low sodium heart healthy   Complete by: As directed    Increase activity slowly   Complete by: As directed      Allergies as of 10/26/2020      Reactions   Codeine Nausea And Vomiting      Medication List    STOP taking these medications   aspirin 81 MG EC tablet   ticagrelor 90 MG Tabs tablet Commonly  known as: BRILINTA     TAKE these medications   albuterol 108 (90 Base) MCG/ACT inhaler Commonly known as: VENTOLIN HFA Inhale 2 puffs into the lungs every 6 (six) hours as needed for wheezing or shortness of breath.   ALPRAZolam 0.25 MG tablet Commonly known as: XANAX Take 1 tablet (0.25 mg total) by mouth daily as needed for anxiety.   atorvastatin 80 MG tablet Commonly known as: LIPITOR Take 1 tablet (80 mg total) by mouth daily at 6 PM.   dapagliflozin propanediol 10 MG Tabs tablet Commonly known as: Farxiga Take 1 tablet (10 mg total) by mouth daily before breakfast.   feeding supplement Liqd Take 237 mLs by mouth 3 (three) times daily between meals.   ipratropium-albuterol 0.5-2.5 (3) MG/3ML Soln Commonly known as: DUONEB Take 3 mLs by nebulization 2 (two) times daily as needed.   ivabradine 7.5 MG Tabs tablet Commonly known as: CORLANOR Take 1 tablet (7.5 mg total) by mouth 2 (two) times daily with a meal.   nitroGLYCERIN 0.4 MG SL tablet Commonly known as: NITROSTAT Place 1 tablet (0.4 mg total) under the tongue every 5 (five) minutes as needed for chest pain.  potassium chloride SA 20 MEQ tablet Commonly known as: KLOR-CON Take 1 tablet (20 mEq total) by mouth daily.   spironolactone 25 MG tablet Commonly known as: ALDACTONE Take 1 tablet (25 mg total) by mouth daily.   torsemide 20 MG tablet Commonly known as: DEMADEX Take 40 mg daily. May take an extra 20 mg PRN for 3lb weightt gain in 24 hrs. What changed: additional instructions            Durable Medical Equipment  (From admission, onward)         Start     Ordered   10/22/20 1659  For home use only DME 4 wheeled rolling walker with seat  Once       Question:  Patient needs a walker to treat with the following condition  Answer:  Weakness   10/22/20 1658          Follow-up Information    Bensimhon, Shaune Pascal, MD Follow up on 11/09/2020.   Specialty: Cardiology Why: at 300 Contact  information: [address] [place] [address] [phone]        Freddi Starr, MD Follow up.   Specialty: Pulmonary Disease Why: Location: [address], [place], [place] [address] Date: 11/23/20 Time: 10:00am Contact information: [address] [place] [address] [phone]              Allergies  Allergen Reactions  . Codeine Nausea And Vomiting        The results of significant diagnostics from this hospitalization (including imaging, microbiology, ancillary and laboratory) are listed below for reference.     Microbiology: Recent Results (from the past 240 hour(s))  Respiratory Panel by RT PCR (Flu A&B, Covid) - Nasopharyngeal Swab     Status: None   Collection Time: 10/20/20  6:07 PM   Specimen: Nasopharyngeal Swab  Result Value Ref Range Status   SARS Coronavirus 2 by RT PCR NEGATIVE NEGATIVE Final    Comment: (NOTE) SARS-CoV-2 target nucleic acids are NOT DETECTED.  The SARS-CoV-2 RNA is generally detectable in upper respiratoy specimens during the acute phase of infection. The lowest concentration of SARS-CoV-2 viral copies this assay can detect is 131 copies/mL. A negative result does not preclude SARS-Cov-2 infection and should not be used as the sole basis for treatment or other patient management decisions. A negative result may occur with  improper specimen collection/handling, submission of specimen other than nasopharyngeal swab, presence of viral mutation(s) within the areas targeted by this assay, and inadequate number of viral copies (<131 copies/mL). A negative result must be combined with clinical observations, patient history, and epidemiological information. The expected result is Negative.  Fact Sheet for Patients:  PinkCheek.be  Fact Sheet for Healthcare Providers:  GravelBags.it  This test is no t yet approved or cleared by the  [place] FDA and  has been authorized for detection and/or diagnosis of SARS-CoV-2 by FDA under an Emergency Use Authorization (EUA). This EUA will remain  in effect (meaning this test can be used) for the duration of the COVID-19 declaration under Section 564(b)(1) of the Act, 21 U.S.C. section 360bbb-3(b)(1), unless the authorization is terminated or revoked sooner.     Influenza A by PCR NEGATIVE NEGATIVE Final   Influenza B by PCR NEGATIVE NEGATIVE Final    Comment: (NOTE) The Xpert Xpress SARS-CoV-2/FLU/RSV assay is intended as an aid in  the diagnosis of influenza from Nasopharyngeal swab specimens and  should not  be used as a sole basis for treatment. Nasal washings and  aspirates are unacceptable for Xpert Xpress SARS-CoV-2/FLU/RSV  testing.  Fact Sheet for Patients: PinkCheek.be  Fact Sheet for Healthcare Providers: GravelBags.it  This test is not yet approved or cleared by the [place] FDA and  has been authorized for detection and/or diagnosis of SARS-CoV-2 by  FDA under an Emergency Use Authorization (EUA). This EUA will remain  in effect (meaning this test can be used) for the duration of the  Covid-19 declaration under Section 564(b)(1) of the Act, 21  U.S.C. section 360bbb-3(b)(1), unless the authorization is  terminated or revoked. Performed at [facility], [place] [address] [place], [place] [address]      Labs: BNP (last 3 results) Recent Labs    05/21/20 1102 10/20/20 1702  BNP 896.5* 7,341.9*   Basic Metabolic Panel: Recent Labs  Lab 10/22/20 0435 10/23/20 0421 10/24/20 0135 10/25/20 0632 10/26/20 0501  NA 138 137 139 139 137  K 4.8 4.2 4.2 4.0 4.4  CL 102 104 104 101 99  CO2 21* 25 26 28 27   GLUCOSE 157* 103* 135* 100* 113*  BUN 34* 41* 35* 31* 45*  CREATININE 1.40* 1.16* 1.02* 1.07* 1.28*  CALCIUM 9.9 9.9 10.1 9.4 9.9   Liver Function Tests: Recent Labs  Lab  10/20/20 1702  AST 23  ALT 15  ALKPHOS 90  BILITOT 1.5*  PROT 7.6  ALBUMIN 4.2   No results for input(s): LIPASE, AMYLASE in the last 168 hours. No results for input(s): AMMONIA in the last 168 hours. CBC: Recent Labs  Lab 10/20/20 1702 10/20/20 1714 10/22/20 0435 10/23/20 0421 10/24/20 0135  WBC 7.3  --  12.7* 11.0* 9.8  NEUTROABS 5.3  --  11.4* 8.7* 7.9*  HGB 15.9* 17.7*  18.0* 14.8 13.5 12.9  HCT 50.2* 52.0*  53.0* 47.2* 42.7 40.2  MCV 94.2  --  96.5 95.5 95.9  PLT 205  --  176 173 168   Cardiac Enzymes: No results for input(s): CKTOTAL, CKMB, CKMBINDEX, TROPONINI in the last 168 hours. BNP: Invalid input(s): POCBNP CBG: Recent Labs  Lab 10/25/20 2051 10/26/20 0048 10/26/20 0615 10/26/20 0733 10/26/20 1131  GLUCAP 237* 166* 110* 117* 125*   D-Dimer No results for input(s): DDIMER in the last 72 hours. Hgb A1c No results for input(s): HGBA1C in the last 72 hours. Lipid Profile No results for input(s): CHOL, HDL, LDLCALC, TRIG, CHOLHDL, LDLDIRECT in the last 72 hours. Thyroid function studies No results for input(s): TSH, T4TOTAL, T3FREE, THYROIDAB in the last 72 hours.  Invalid input(s): FREET3 Anemia work up No results for input(s): VITAMINB12, FOLATE, FERRITIN, TIBC, IRON, RETICCTPCT in the last 72 hours. Urinalysis    Component Value Date/Time   COLORURINE YELLOW 02/27/2018 1255   APPEARANCEUR HAZY (A) 02/27/2018 1255   LABSPEC 1.014 02/27/2018 1255   PHURINE 8.0 02/27/2018 1255   GLUCOSEU NEGATIVE 02/27/2018 1255   HGBUR NEGATIVE 02/27/2018 1255   BILIRUBINUR NEGATIVE 02/27/2018 1255   KETONESUR NEGATIVE 02/27/2018 1255   PROTEINUR NEGATIVE 02/27/2018 1255   UROBILINOGEN 1.0 03/15/2015 1117   NITRITE NEGATIVE 02/27/2018 1255   LEUKOCYTESUR SMALL (A) 02/27/2018 1255   Sepsis Labs Invalid input(s): PROCALCITONIN,  WBC,  LACTICIDVEN Microbiology Recent Results (from the past 240 hour(s))  Respiratory Panel by RT PCR (Flu A&B, Covid) -  Nasopharyngeal Swab     Status: None   Collection Time: 10/20/20  6:07 PM   Specimen: Nasopharyngeal Swab  Result Value Ref  Range Status   SARS Coronavirus 2 by RT PCR NEGATIVE NEGATIVE Final    Comment: (NOTE) SARS-CoV-2 target nucleic acids are NOT DETECTED.  The SARS-CoV-2 RNA is generally detectable in upper respiratoy specimens during the acute phase of infection. The lowest concentration of SARS-CoV-2 viral copies this assay can detect is 131 copies/mL. A negative result does not preclude SARS-Cov-2 infection and should not be used as the sole basis for treatment or other patient management decisions. A negative result may occur with  improper specimen collection/handling, submission of specimen other than nasopharyngeal swab, presence of viral mutation(s) within the areas targeted by this assay, and inadequate number of viral copies (<131 copies/mL). A negative result must be combined with clinical observations, patient history, and epidemiological information. The expected result is Negative.  Fact Sheet for Patients:  PinkCheek.be  Fact Sheet for Healthcare Providers:  GravelBags.it  This test is no t yet approved or cleared by the [place] FDA and  has been authorized for detection and/or diagnosis of SARS-CoV-2 by FDA under an Emergency Use Authorization (EUA). This EUA will remain  in effect (meaning this test can be used) for the duration of the COVID-19 declaration under Section 564(b)(1) of the Act, 21 U.S.C. section 360bbb-3(b)(1), unless the authorization is terminated or revoked sooner.     Influenza A by PCR NEGATIVE NEGATIVE Final   Influenza B by PCR NEGATIVE NEGATIVE Final    Comment: (NOTE) The Xpert Xpress SARS-CoV-2/FLU/RSV assay is intended as an aid in  the diagnosis of influenza from Nasopharyngeal swab specimens and  should not be used as a sole basis for treatment. Nasal washings and   aspirates are unacceptable for Xpert Xpress SARS-CoV-2/FLU/RSV  testing.  Fact Sheet for Patients: PinkCheek.be  Fact Sheet for Healthcare Providers: GravelBags.it  This test is not yet approved or cleared by the [place] FDA and  has been authorized for detection and/or diagnosis of SARS-CoV-2 by  FDA under an Emergency Use Authorization (EUA). This EUA will remain  in effect (meaning this test can be used) for the duration of the  Covid-19 declaration under Section 564(b)(1) of the Act, 21  U.S.C. section 360bbb-3(b)(1), unless the authorization is  terminated or revoked. Performed at [facility], [place] [address], [place], [place] [address]      Time coordinating discharge: Over 30 minutes  SIGNED:   Desiree Hane, MD  [facility] 10/26/2020, 2:47 PM Pager   If 7PM-7AM, please contact night-coverage [email or web address] Password [email or web address]

## 2020-10-26 NOTE — Telephone Encounter (Signed)
Please schedule patient for hospital follow up in 3-4 weeks with Dr. Lamonte Sakai or myself for hemoptysis and asthma.   Thank you, Freda Jackson, MD [facility] Office: [phone]   See Amion for Pager Details

## 2020-10-26 NOTE — TOC Transition Note (Signed)
Transition of Care [facility]) - CM/SW Discharge Note   Patient Details  Name: Brittany [name] MRN: [id number] Date of Birth: Apr 10, 1961  Transition of Care [facility]) CM/SW Contact:  Zenon Mayo, RN Phone Number: 10/26/2020, 3:03 PM   Clinical Narrative:    Patient for dc today, she will need a rollator before discharge, NCM made referral to [facility] with [employer].  rollator will brought up to room prior to dc.    Final next level of care: Home/Self Care Barriers to Discharge: No Barriers Identified   Patient Goals and CMS Choice        Discharge Placement                       Discharge Plan and Services                DME Arranged: Walker rolling with seat DME Agency: [employer]       HH Arranged: NA          Social Determinants of Health (SDOH) Interventions     Readmission Risk Interventions No flowsheet data found.

## 2020-10-27 ENCOUNTER — Ambulatory Visit (INDEPENDENT_AMBULATORY_CARE_PROVIDER_SITE_OTHER): Payer: BC Managed Care – PPO

## 2020-10-27 DIAGNOSIS — I5042 Chronic combined systolic (congestive) and diastolic (congestive) heart failure: Secondary | ICD-10-CM | POA: Diagnosis not present

## 2020-10-27 DIAGNOSIS — Z95810 Presence of automatic (implantable) cardiac defibrillator: Secondary | ICD-10-CM | POA: Diagnosis not present

## 2020-10-29 NOTE — Progress Notes (Signed)
EPIC Encounter for ICM Monitoring  Patient Name: Brittany [name] is a 59 y.o. female Date: 10/29/2020 Primary Care Physican: Maude Leriche, PA-C Primary Cardiologist:Bensimhon Electrophysiologist:Allred 9/10/2021Weight: 681PTE  Attempted call to patient and unable to reach. Transmission reviewed. Hospitalized 11/10-11/16 with dx of acute hypoxic respiratory failure presumed secondary to combined acute on chronic systolic CHF exacerbation, acute exacerbation of COPD.  OptivolThoracic impedance suggesting possible fluid accumulation starting 10/12/2020 and returned to baseline on transmission date of 10/26/2020.  Patient hospitalized 11/10-11/6.  Prescribed:   Torsemide20 mgTake 2 tablets (40 mg total) by mouth daily. Takean extra 20 mg PRN for 3 lb weight gain in 24 hours  Potassium 20 mEq take 1 tablet daily  Spironolactone 25 mg take 1 tablet daily  Labs: 10/26/2020 Creatinine 1.28, BUN 45, Potassium 4.4, Sodium 137, GFR 48 10/25/2020 Creatinine 1.07, BUN 31, Potassium 4.0, Sodium 139, GFR 59  10/24/2020 Creatinine 1.02, BUN 35, Potassium 4.2, Sodium 139, GFR >60  10/23/2020 Creatinine 1.16, BUN 41, Potassium 4.2, Sodium 137, GFR 54  10/22/2020 Creatinine 1.40, BUN 34, Potassium 4.8, Sodium 137, GFR >60  10/21/2020 Creatinine 1.03, BUN 17, Potassium 4.9, Sodium 137, GFR >60  A complete set of results can be found in Results Review.  Recommendations:Unable to reach.    Follow-up plan: ICM clinic phone appointment on12/06/2020 to recheck fluid levels post hopitalization. 91 day device clinic remote transmission not scheduled.  EP/Cardiology Office Visits: 11/08/2020 with Dr [name]  Copy of ICM check sent to Dr.Allred.   3 month ICM trend: 10/26/2020    1 Year ICM trend:       [name], RN 10/29/2020 12:21 PM

## 2020-11-06 NOTE — Progress Notes (Signed)
Error

## 2020-11-08 ENCOUNTER — Other Ambulatory Visit (HOSPITAL_COMMUNITY): Payer: BC Managed Care – PPO

## 2020-11-08 ENCOUNTER — Inpatient Hospital Stay (HOSPITAL_COMMUNITY)
Admission: RE | Admit: 2020-11-08 | Discharge: 2020-11-08 | Disposition: A | Discharge status: Home / Self Care | Payer: BC Managed Care – PPO | Source: Ambulatory Visit | Attending: Internal Medicine | Admitting: Internal Medicine

## 2020-11-10 ENCOUNTER — Other Ambulatory Visit: Payer: Self-pay

## 2020-11-10 ENCOUNTER — Ambulatory Visit (HOSPITAL_COMMUNITY)
Admission: RE | Admit: 2020-11-10 | Discharge: 2020-11-10 | Disposition: A | Discharge status: Home / Self Care | Payer: BC Managed Care – PPO | Source: Ambulatory Visit | Attending: Internal Medicine | Admitting: Internal Medicine

## 2020-11-10 ENCOUNTER — Encounter (HOSPITAL_COMMUNITY): Payer: Self-pay | Admitting: Internal Medicine

## 2020-11-10 VITALS — BP 130/80 | HR 74 | Wt 106.2 lb

## 2020-11-10 DIAGNOSIS — L905 Scar conditions and fibrosis of skin: Secondary | ICD-10-CM | POA: Diagnosis not present

## 2020-11-10 DIAGNOSIS — Z87891 Personal history of nicotine dependence: Secondary | ICD-10-CM | POA: Insufficient documentation

## 2020-11-10 DIAGNOSIS — R42 Dizziness and giddiness: Secondary | ICD-10-CM | POA: Diagnosis not present

## 2020-11-10 DIAGNOSIS — F329 Major depressive disorder, single episode, unspecified: Secondary | ICD-10-CM | POA: Insufficient documentation

## 2020-11-10 DIAGNOSIS — I110 Hypertensive heart disease with heart failure: Secondary | ICD-10-CM | POA: Insufficient documentation

## 2020-11-10 DIAGNOSIS — F419 Anxiety disorder, unspecified: Secondary | ICD-10-CM | POA: Diagnosis not present

## 2020-11-10 DIAGNOSIS — J90 Pleural effusion, not elsewhere classified: Secondary | ICD-10-CM | POA: Diagnosis not present

## 2020-11-10 DIAGNOSIS — Z7901 Long term (current) use of anticoagulants: Secondary | ICD-10-CM | POA: Insufficient documentation

## 2020-11-10 DIAGNOSIS — Z8249 Family history of ischemic heart disease and other diseases of the circulatory system: Secondary | ICD-10-CM | POA: Insufficient documentation

## 2020-11-10 DIAGNOSIS — Z7984 Long term (current) use of oral hypoglycemic drugs: Secondary | ICD-10-CM | POA: Insufficient documentation

## 2020-11-10 DIAGNOSIS — I252 Old myocardial infarction: Secondary | ICD-10-CM | POA: Diagnosis not present

## 2020-11-10 DIAGNOSIS — I5023 Acute on chronic systolic (congestive) heart failure: Secondary | ICD-10-CM | POA: Diagnosis not present

## 2020-11-10 DIAGNOSIS — I480 Paroxysmal atrial fibrillation: Secondary | ICD-10-CM | POA: Insufficient documentation

## 2020-11-10 DIAGNOSIS — Z951 Presence of aortocoronary bypass graft: Secondary | ICD-10-CM | POA: Diagnosis not present

## 2020-11-10 DIAGNOSIS — I5032 Chronic diastolic (congestive) heart failure: Secondary | ICD-10-CM | POA: Diagnosis present

## 2020-11-10 DIAGNOSIS — J9611 Chronic respiratory failure with hypoxia: Secondary | ICD-10-CM | POA: Diagnosis not present

## 2020-11-10 DIAGNOSIS — Z85118 Personal history of other malignant neoplasm of bronchus and lung: Secondary | ICD-10-CM | POA: Diagnosis not present

## 2020-11-10 DIAGNOSIS — I2510 Atherosclerotic heart disease of native coronary artery without angina pectoris: Secondary | ICD-10-CM | POA: Diagnosis not present

## 2020-11-10 DIAGNOSIS — Z923 Personal history of irradiation: Secondary | ICD-10-CM | POA: Diagnosis not present

## 2020-11-10 DIAGNOSIS — Z79899 Other long term (current) drug therapy: Secondary | ICD-10-CM | POA: Insufficient documentation

## 2020-11-10 DIAGNOSIS — I509 Heart failure, unspecified: Secondary | ICD-10-CM

## 2020-11-10 LAB — BASIC METABOLIC PANEL
Anion gap: 15 (ref 5–15)
BUN: 14 mg/dL (ref 6–20)
CO2: 25 mmol/L (ref 22–32)
Calcium: 9.8 mg/dL (ref 8.9–10.3)
Chloride: 101 mmol/L (ref 98–111)
Creatinine, Ser: 0.95 mg/dL (ref 0.44–1.00)
GFR, Estimated: >60 mL/min (ref >60)
Glucose, Bld: 99 mg/dL (ref 70–99)
Potassium: 2.9 mmol/L — ABNORMAL LOW (ref 3.5–5.1)
Sodium: 141 mmol/L (ref 135–145)

## 2020-11-10 MED ORDER — POTASSIUM CHLORIDE CRYS ER 20 MEQ PO TBCR: 20.0000 meq | EXTENDED_RELEASE_TABLET | Freq: Every day | ORAL | 3 refills | Status: DC

## 2020-11-10 MED ORDER — METOLAZONE 2.5 MG PO TABS: 2.5000 mg | ORAL_TABLET | ORAL | 0 refills | Status: DC

## 2020-11-10 MED ORDER — TORSEMIDE 20 MG PO TABS
60.0000 mg | ORAL_TABLET | Freq: Every day | ORAL | 3 refills | Status: DC
Start: 2020-11-10 — End: 2020-11-16

## 2020-11-10 MED ORDER — ASPIRIN EC 81 MG PO TBEC: 81.0000 mg | DELAYED_RELEASE_TABLET | Freq: Every day | ORAL | 3 refills | Status: DC

## 2020-11-10 NOTE — Progress Notes (Signed)
Advanced HF Clinic Note   ID:  Brittany, [name] [birth year], MRN [id number]  Location: Home  Provider location: [place] Type of Visit: Established patient   PCP:  Scifres, [name], PA-C  Cardiologist:  No primary care provider on file. 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 Underwent emergent cath 02/03/18 showed LAD 100% stenosed, LCx 95% stenosed. Taken for emergent CABG 02/03/18. Required impella post op. Hospital course complicated by cardiogenic shock, HCAP, A fib, respiratory failure, and swallowing issues. She was discharged to SNF. Discharge weight 103 pounds.   In 2019 had multiple hospitalizations for HF and pleural effusion. Underwent pleurodesis at [facility].  Admitted 3/20 with NSTEMI and HF. Received DES to ostial ramus into distal left main based on cath below and underwent diuresis. Meds adjusted as tolerated. Echo with EF 25-30%  he had a ReDS check on 5/8 with reading of 35%, but weight was up 2 lbs and she was SOB.  Admitted 11/21 with COPD flare with mild HF and hemoptysis..CTA showed stable scarring in the right hilum consistent with emphysema.. Underwent bronchoscopy on 11/15, no obvious source of bleeding found on examination.   Echo 11/21 EF 20-25%. RV mildly HK.   Here for post-hospital f/u. Feels terrible. SOB at rest and with any activity. Feels full in abdomen. Using nebulizer frequently and took an extra torsemide on Sunday. +PND/orthopnea and dizziness, no CP or palpitations. No hemoptysis or abnormal bleeding, has pulmonary follow up scheduled. Wears 2L oxygen prn,  mostly during the day with activity. Off ASA & Brilinta since discharge.   ICD today: Volume up past threshold, impedence down. No VT/AF. Activity ~1.5-2 hours/day. Personally reviewed  Echo 02/12/19: EF 25-30%, RV mildly reduced  LHC 02/12/19:  Ost LAD to Prox LAD lesion is 100% stenosed.  Ost Cx to Prox Cx lesion is 100% stenosed.  Ost Ramus lesion is 99% stenosed.  Post intervention, there is a 0% residual stenosis.  A drug-eluting stent was successfully placed using a [place] [id number].  SVG and is normal in caliber.  The graft exhibits no disease.  Ost 1st Diag lesion is 70% stenosed.  SVG and is normal in caliber.  Prox Graft lesion is 100% stenosed. 1. Significant underlying two-vessel coronary artery disease with patent SVG to OM and SVG to diagonal which supplies the LAD territory. Atretic LIMA to LAD given competitive flow from SVG to diagonal. Severe ostial ramus artery stenosis not supplied by bypass with his area suggestive of plaque rupture. 2. Severely elevated left ventricular end-diastolic pressure 34 mmHg. Left ventricular angiography was not performed. 3. Successful angioplasty and drug-eluting stent placement to the ostial ramus extending into the distal left main.  Past Medical History:  Diagnosis Date  . Acute systolic congestive heart failure ([place]) 02/03/2018  . Allergy   . Anxiety   . Asthma   . DM2 (diabetes mellitus, type 2) ([place]) 10/20/2020  . Hypertension   . PAF (paroxysmal atrial fibrillation) ([place])   . Prophylactic measure 08/03/14-08/19/14   Prophyl. cranial radiation 24 Gy  . S/P emergency CABG x 3 02/03/2018   LIMA to LAD, SVG to D1,  SVG to OM1, EVH via right thigh with implantation of Impella LD LVAD via direct aortic approach  . Small cell lung cancer ([place]) 03/16/2014   Past Surgical History:  Procedure Laterality Date  . BRONCHIAL BRUSHINGS  10/25/2020   Procedure: BRONCHIAL BRUSHINGS;  Surgeon: Collene Gobble, MD;   Location: [facility] ENDOSCOPY;  Service: Pulmonary;;  . BRONCHIAL NEEDLE ASPIRATION BIOPSY  10/25/2020   Procedure: BRONCHIAL NEEDLE ASPIRATION BIOPSIES;  Surgeon: Collene Gobble, MD;  Location: [facility] ENDOSCOPY;  Service: Pulmonary;;  . CARDIAC DEFIBRILLATOR PLACEMENT  08/15/2018   MDT [place] AF MRI VR ICD implanted by Dr Loel Lofty for primary prevention of sudden  . CESAREAN SECTION    . CORONARY ARTERY BYPASS GRAFT N/A 02/03/2018   Procedure: CORONARY ARTERY BYPASS GRAFTING (CABG);  Surgeon: Rexene Alberts, MD;  Location: [place];  Service: Open Heart Surgery;  Laterality: N/A;  Time 3 using left internal mammary artery and endoscopically harvested right saphenous vein  . CORONARY BALLOON ANGIOPLASTY N/A 02/03/2018   Procedure: CORONARY BALLOON ANGIOPLASTY;  Surgeon: Martinique, Peter M, MD;  Location: [place] CV LAB;  Service: Cardiovascular;  Laterality: N/A;  . CORONARY STENT INTERVENTION N/A 02/12/2019   Procedure: CORONARY STENT INTERVENTION;  Surgeon: Wellington Hampshire, MD;  Location: [place] CV LAB;  Service: Cardiovascular;  Laterality: N/A;  . CORONARY/GRAFT ACUTE MI REVASCULARIZATION N/A 02/03/2018   Procedure: Coronary/Graft Acute MI Revascularization;  Surgeon: Martinique, Peter M, MD;  Location: [place] CV LAB;  Service: Cardiovascular;  Laterality: N/A;  . ENDOBRONCHIAL ULTRASOUND N/A 10/25/2020   Procedure: ENDOBRONCHIAL ULTRASOUND;  Surgeon: Collene Gobble, MD;  Location: [facility] ENDOSCOPY;  Service: Pulmonary;  Laterality: N/A;  . FLEXIBLE BRONCHOSCOPY  10/25/2020   Procedure: FLEXIBLE BRONCHOSCOPY;  Surgeon: Collene Gobble, MD;  Location: [facility] ENDOSCOPY;  Service: Pulmonary;;  . IABP INSERTION N/A 02/03/2018   Procedure: IABP Insertion;  Surgeon: Martinique, Peter M, MD;  Location: [place] CV LAB;  Service: Cardiovascular;  Laterality: N/A;  . INTRAOPERATIVE TRANSESOPHAGEAL ECHOCARDIOGRAM N/A 02/03/2018   Procedure: INTRAOPERATIVE TRANSESOPHAGEAL ECHOCARDIOGRAM;  Surgeon: Rexene Alberts, MD;   Location: [place];  Service: Open Heart Surgery;  Laterality: N/A;  . LEFT HEART CATH AND CORONARY ANGIOGRAPHY N/A 02/03/2018   Procedure: LEFT HEART CATH AND CORONARY ANGIOGRAPHY;  Surgeon: Martinique, Peter M, MD;  Location: [place] CV LAB;  Service: Cardiovascular;  Laterality: N/A;  . LEFT HEART CATH AND CORS/GRAFTS ANGIOGRAPHY N/A 02/12/2019   Procedure: LEFT HEART CATH AND CORS/GRAFTS ANGIOGRAPHY;  Surgeon: Wellington Hampshire, MD;  Location: [place] CV LAB;  Service: Cardiovascular;  Laterality: N/A;  . MEDIASTINOSCOPY N/A 03/11/2014   Procedure: MEDIASTINOSCOPY;  Surgeon: Melrose Nakayama, MD;  Location: [place];  Service: Thoracic;  Laterality: N/A;  . PLACEMENT OF [place] LEFT VENTRICULAR ASSIST DEVICE  02/03/2018   Procedure: PLACEMENT OF [place] LEFT VENTRICULAR ASSIST DEVICE LD;  Surgeon: Rexene Alberts, MD;  Location: [place];  Service: Open Heart Surgery;;  . REMOVAL OF IMPELLA LEFT VENTRICULAR ASSIST DEVICE N/A 02/08/2018   Procedure: REMOVAL OF [place] LEFT VENTRICULAR ASSIST DEVICE;  Surgeon: Rexene Alberts, MD;  Location: [place];  Service: Open Heart Surgery;  Laterality: N/A;  . RIGHT HEART CATH N/A 02/03/2018   Procedure: RIGHT HEART CATH;  Surgeon: Martinique, Peter M, MD;  Location: [place] CV LAB;  Service: Cardiovascular;  Laterality: N/A;  . RIGHT HEART CATH N/A 05/09/2018   Procedure: RIGHT HEART CATH;  Surgeon: Jolaine Artist, MD;  Location: [facility]  INVASIVE CV LAB;  Service: Cardiovascular;  Laterality: N/A;  . TEE WITHOUT CARDIOVERSION N/A 02/08/2018   Procedure: TRANSESOPHAGEAL ECHOCARDIOGRAM (TEE);  Surgeon: Rexene Alberts, MD;  Location: [place];  Service: Open Heart Surgery;  Laterality: N/A;  . TUBAL LIGATION    . VIDEO BRONCHOSCOPY WITH ENDOBRONCHIAL ULTRASOUND N/A 03/11/2014   Procedure: VIDEO BRONCHOSCOPY WITH ENDOBRONCHIAL ULTRASOUND;  Surgeon: Melrose Nakayama, MD;  Location: [place];  Service: Thoracic;  Laterality: N/A;     Current Outpatient Medications    Medication Sig Dispense Refill  . albuterol (VENTOLIN HFA) 108 (90 Base) MCG/ACT inhaler Inhale 2 puffs into the lungs every 6 (six) hours as needed for wheezing or shortness of breath. 1 Inhaler 0  . ALPRAZolam (XANAX) 0.25 MG tablet Take 1 tablet (0.25 mg total) by mouth daily as needed for anxiety. 10 tablet 0  . atorvastatin (LIPITOR) 80 MG tablet Take 1 tablet (80 mg total) by mouth daily at 6 PM. 30 tablet 1  . dapagliflozin propanediol (FARXIGA) 10 MG TABS tablet Take 1 tablet (10 mg total) by mouth daily before breakfast. 30 tablet 6  . feeding supplement (ENSURE ENLIVE / ENSURE PLUS) LIQD Take 237 mLs by mouth 3 (three) times daily between meals. 237 mL 12  . ipratropium-albuterol (DUONEB) 0.5-2.5 (3) MG/3ML SOLN Take 3 mLs by nebulization 2 (two) times daily as needed.    . ivabradine (CORLANOR) 7.5 MG TABS tablet Take 1 tablet (7.5 mg total) by mouth 2 (two) times daily with a meal. 60 tablet 6  . nitroGLYCERIN (NITROSTAT) 0.4 MG SL tablet Place 1 tablet (0.4 mg total) under the tongue every 5 (five) minutes as needed for chest pain. 90 tablet 5  . potassium chloride SA (K-DUR,KLOR-CON) 20 MEQ tablet Take 1 tablet (20 mEq total) by mouth daily. 90 tablet 3  . spironolactone (ALDACTONE) 25 MG tablet Take 1 tablet (25 mg total) by mouth daily. 30 tablet 6  . torsemide (DEMADEX) 20 MG tablet Take 40 mg daily. May take an extra 20 mg PRN for 3lb weightt gain in 24 hrs. 300 tablet 3   No current facility-administered medications for this encounter.    Allergies:   Codeine   Social History:  The patient  reports that she quit smoking about 6 years ago. Her smoking use included cigarettes. She has a 20.00 pack-year smoking history. She has never used smokeless tobacco. She reports current alcohol use of about 8.0 standard drinks of alcohol per week. She reports current drug use. Drug: Marijuana.   Family History:  The patient's family history includes Cancer in her maternal grandmother;  Diabetes in her paternal grandmother; Heart attack in her mother; Heart disease in her mother; Hypertension in her maternal grandmother and mother.   ROS:  Please see the history of present illness.   All other systems are personally reviewed and negative.    Vitals:   11/10/20 1555  BP: 130/80  Pulse: 74  SpO2: 100%  Weight: 48.2 kg (106 lb 3.2 oz)   PHYSICAL EXAM General:  SOB at rest, mildly diaphoretic HEENT: normal Neck: supple. no JVD. Carotids 2+ bilat; no bruits. No lymphadenopathy or thryomegaly appreciated. Cor: PMI nondisplaced. Regular rate & rhythm. No rubs, gallops or murmurs. Lungs: wheezes throughout, R>L Abdomen: soft, nontender, nondistended. No hepatosplenomegaly. No bruits or masses. Good bowel sounds. Extremities: no cyanosis, clubbing, rash, trace LE edema Neuro: alert & orientedx3, cranial nerves grossly intact. moves all 4 extremities w/o difficulty. Affect pleasant  NSR 87 No  ST-T abnormalities Personally reviewed  ICD today: Volume up past threshold, impedence down. No VT/AF. Activity ~1.5-2 hours/day. Personally reviewed  ReDs today: 42%  Recent Labs: 10/20/2020: ALT 15; B Natriuretic Peptide 1,118.4 10/24/2020: Hemoglobin 12.9; Platelets 168 10/26/2020: BUN 45; Creatinine, Ser 1.28; Potassium 4.4; Sodium 137  Personally reviewed   Wt Readings from Last 3 Encounters:  11/10/20 48.2 kg (106 lb 3.2 oz)  10/26/20 47.7 kg (105 lb 3.2 oz)  06/11/20 50.4 kg (111 lb 3.2 oz)     ASSESSMENT AND PLAN:  1.Acute on Chronicsystolic HF due to ICM. S/p Medtronic single chamber ICD. -Echo 09/04/18 ([facility]) LVEF 20%, Moderate AI, Mild MR, Mild TR, Severe LAE, RV mildly decreased.  - Echo 02/12/19: EF 25-30%, RV mildly reduced - Echo 11/21 EF 20-25% RV mild HK - NYHA III-IIIIb - Volume status elevated on exam, ReDs, and by Optivol - Start Metolazone 2.52m x 2 days + 40 mEq KCl - Increase torsemide to 60 mg daily starting Friday - Continue spiro 25 mg qHS.  -  Continue corlanor 7.5 bid - Consider low-dose losartan soon  - No b-blocker yet due to intolerance with low output - BMET today & repeat on Monday 12/6 - Will need to do a remote ICD transmission on Friday  2. CAD  - Hx of NSTEMI/STEMI s/p Emergent CABG 02/03/18: Cath with severe 2v CAD as above with severe LV dysfunction/ICM. s/p Emergent CABG 02/03/18. - Had NSTEMI and now s/p LHC 02/12/19 with DES to the ostial ramus extending into the distal left main - ASA + Brilinta stopped at recent discharge due to bleeding - restart ASA  3. Recurrent pleural effusions s/p pleurodesis - Trace pleural effusion on right on CXR on recent admit.   4. Chronic hypoxic respiratory failure - multifactorial; wheezes on exam today, has pulmonary follow up soon - Wears O2 PRN at home.   5. PAF - CHA2DS2/VASc is at least 4. (CHF, Vasc disease, HTN, Female).  - She has history of Afib RVR in the past in the setting of her Lung CA. Previously on Xarelto but stopped.  - restart ASA - No afib byICD interrogation today   6. 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  - Chest CT 02/19/18 with mass-like consolidation in R hilum concerning for recurrent tumor.  - Repeat CT 03/27/2018 -No definite findings of locally recurrent tumor in the right lung. 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 Continued chest CT surveillance is advised in 3-6 months. - S/p thoracentesis 04/2018. Cytology negative for malignancy.  - CTA  11/19 stable scarring in the right hilum consistent with emphysema.Bronchoscopy on 11/15, no obvious source of bleeding  7. Anxiety/Depression - Stable - Continue zoloft 50 daily. Per PCP.   Follow back on Monday for BMET and to assess fluid status.   Signed, DGlori Bickers MD  11/10/2020 4:39 PM

## 2020-11-10 NOTE — Patient Instructions (Addendum)
RESTART ASA 81mg  (1 tablet) Daily  Take Metolazone 2.5mg  (1 tablet) TODAY AND TOMORROW AND THEN STOP   Take Potassium 68meq (2 tablets) with your metolazone doses  ON Friday INCREASE Torsemide 60mg  (3 tablets) Daily  Labs done today, your results will be available in MyChart, we will contact you for abnormal readings.  Your physician recommends that you schedule a follow-up appointment on Monday at 8:30   If you have any questions or concerns before your next appointment please send [place] a message through [place] or call our office at [phone].    TO LEAVE A MESSAGE FOR THE NURSE SELECT OPTION 2, PLEASE LEAVE A MESSAGE INCLUDING: . YOUR NAME . DATE OF BIRTH . CALL BACK NUMBER . REASON FOR CALL**this is important as we prioritize the call backs  YOU WILL RECEIVE A CALL BACK THE SAME DAY AS LONG AS YOU CALL BEFORE 4:00 PM

## 2020-11-10 NOTE — Progress Notes (Signed)
ReDS Vest / Clip - 11/10/20 1600      ReDS Vest / Clip   Station Marker A    Ruler Value 27    ReDS Value Range High volume overload    ReDS Actual Value 42

## 2020-11-12 ENCOUNTER — Telehealth: Payer: Self-pay

## 2020-11-12 ENCOUNTER — Ambulatory Visit (INDEPENDENT_AMBULATORY_CARE_PROVIDER_SITE_OTHER): Payer: Medicare Other

## 2020-11-12 DIAGNOSIS — I509 Heart failure, unspecified: Secondary | ICD-10-CM

## 2020-11-12 DIAGNOSIS — Z95810 Presence of automatic (implantable) cardiac defibrillator: Secondary | ICD-10-CM

## 2020-11-12 MED ORDER — POTASSIUM CHLORIDE CRYS ER 20 MEQ PO TBCR
40.0000 meq | EXTENDED_RELEASE_TABLET | Freq: Every day | ORAL | 3 refills | Status: DC
Start: 2020-11-12 — End: 2020-11-16

## 2020-11-12 NOTE — Telephone Encounter (Signed)
ICM call to patient.  Advised Dr [name] requested a follow up call and remote transmission today after he ordered metolazone during office visit.  She said her monitor is saying low battery.  Provided Medtr[employer] number and to call this AM to get assistance with troubleshooting the monitor.

## 2020-11-12 NOTE — Telephone Encounter (Signed)
Transmission received and reviewed.  See ICM note for more details.

## 2020-11-12 NOTE — Progress Notes (Signed)
EPIC Encounter for ICM Monitoring  Patient Name: Jessye Imhoff is a 59 y.o. female Date: 11/12/2020 Primary Care Physican: Scifres, [name], PA-C Primary Cardiologist:Bensimhon Electrophysiologist:Allred 11/12/2020 Weight: 105 lbs  Spoke with patient and reports she feels like the fluid accumulation has resolved.  She had excellent urine output with Metolazone.  Advised of low potassium and HF clinic had attempted to reach her with new instructions.  Advised Dr [name] ordered her to take 80 mEq Potassium today and then increase to 2 tablets (70mEq total) daily.  Advised to take Potassium immediately after call has ended   OptivolThoracic impedance suggesting fluid levels returned to normal after Dr [name] prescribed Metolazone 1 tablet x 2 days with extra Potassium at 12/1 OV.  Prescribed:   Torsemide20 mgTake 6 tablets (60 mg total) by mouth daily.   Potassium 20 mEq take 2 tablets (40 mEq total) daily  Spironolactone 25 mg take 1 tablet daily  Labs: 11/10/2020 Creatinine 0.95, BUN 14, Potassium 2.9, Sodium 141, GFR >60 10/26/2020 Creatinine 1.28, BUN 45, Potassium 4.4, Sodium 137, GFR 48 10/25/2020 Creatinine 1.07, BUN 31, Potassium 4.0, Sodium 139, GFR 59  10/24/2020 Creatinine 1.02, BUN 35, Potassium 4.2, Sodium 139, GFR >60  10/23/2020 Creatinine 1.16, BUN 41, Potassium 4.2, Sodium 137, GFR 54  10/22/2020 Creatinine 1.40, BUN 34, Potassium 4.8, Sodium 137, GFR >60  10/21/2020 Creatinine 1.03, BUN 17, Potassium 4.9, Sodium 137, GFR >60  A complete set of results can be found in Results Review.  Recommendations: Patient is aware she should not take any further Metolazone dosages unless directed by HF clinic.   Advised of new Potassium instructions and reviewed low potassium symptoms.  Advised if she has any changes to call 911 if needed.  She verbalized understanding of potassium orders.  BMET will be drawn 12/6  Follow-up plan: ICM clinic phone  appointment on12/20/2021. 91 day device clinic remote transmission not scheduled.  EP/Cardiology Office Visits: 11/15/2020 with Advanced HF clinic.  Copy of ICM check sent to Dr.Allred.  3 month ICM trend: 11/12/2020    1 Year ICM trend:       [name], RN 11/12/2020 11:34 AM

## 2020-11-12 NOTE — Telephone Encounter (Signed)
Spoke with patient for ICM follow up.  Discussed Optivol fluid reading.  Advised Moshe Cipro, CMA attempted to call her this AM to advise of lab results.  Advised Dr [name] reviewed labs and she has low potassium after taking 2 dosage of Metolazone.  Advised Dr [name] ordered her to take Potassium 80 mEq today and then to take Potassium 20 mEq 2 tablets daily thereafter.  She will need a BMET to recheck potassium levels next week.  She has an OV appointment with HF Clinic on 12/6 and will have BMET drawn at that time.  She verbalized understanding.  Advised to take extra Potassium as soon as call is completed and reviewed symptoms of low potassium.  Advised to use ER if needed for any problems with low potassium.  Patient has enough Potassium on hand and does not need a refill at this time.

## 2020-11-15 ENCOUNTER — Ambulatory Visit (HOSPITAL_COMMUNITY)
Admission: RE | Admit: 2020-11-15 | Discharge: 2020-11-15 | Disposition: A | Discharge status: Home / Self Care | Payer: BC Managed Care – PPO | Source: Ambulatory Visit | Attending: Cardiology | Admitting: Cardiology

## 2020-11-15 ENCOUNTER — Other Ambulatory Visit: Payer: Self-pay

## 2020-11-15 ENCOUNTER — Encounter (HOSPITAL_COMMUNITY): Payer: Self-pay

## 2020-11-15 VITALS — BP 105/67 | HR 99 | Wt 102.2 lb

## 2020-11-15 DIAGNOSIS — Z85118 Personal history of other malignant neoplasm of bronchus and lung: Secondary | ICD-10-CM | POA: Diagnosis not present

## 2020-11-15 DIAGNOSIS — F329 Major depressive disorder, single episode, unspecified: Secondary | ICD-10-CM | POA: Diagnosis not present

## 2020-11-15 DIAGNOSIS — F419 Anxiety disorder, unspecified: Secondary | ICD-10-CM | POA: Insufficient documentation

## 2020-11-15 DIAGNOSIS — Z79899 Other long term (current) drug therapy: Secondary | ICD-10-CM | POA: Diagnosis not present

## 2020-11-15 DIAGNOSIS — L905 Scar conditions and fibrosis of skin: Secondary | ICD-10-CM | POA: Insufficient documentation

## 2020-11-15 DIAGNOSIS — J449 Chronic obstructive pulmonary disease, unspecified: Secondary | ICD-10-CM | POA: Diagnosis not present

## 2020-11-15 DIAGNOSIS — I2510 Atherosclerotic heart disease of native coronary artery without angina pectoris: Secondary | ICD-10-CM | POA: Diagnosis not present

## 2020-11-15 DIAGNOSIS — I480 Paroxysmal atrial fibrillation: Secondary | ICD-10-CM | POA: Insufficient documentation

## 2020-11-15 DIAGNOSIS — Z87891 Personal history of nicotine dependence: Secondary | ICD-10-CM | POA: Insufficient documentation

## 2020-11-15 DIAGNOSIS — J90 Pleural effusion, not elsewhere classified: Secondary | ICD-10-CM | POA: Diagnosis not present

## 2020-11-15 DIAGNOSIS — Z923 Personal history of irradiation: Secondary | ICD-10-CM | POA: Diagnosis not present

## 2020-11-15 DIAGNOSIS — Z951 Presence of aortocoronary bypass graft: Secondary | ICD-10-CM | POA: Diagnosis not present

## 2020-11-15 DIAGNOSIS — Z7982 Long term (current) use of aspirin: Secondary | ICD-10-CM | POA: Insufficient documentation

## 2020-11-15 DIAGNOSIS — J9611 Chronic respiratory failure with hypoxia: Secondary | ICD-10-CM | POA: Diagnosis not present

## 2020-11-15 DIAGNOSIS — I252 Old myocardial infarction: Secondary | ICD-10-CM | POA: Insufficient documentation

## 2020-11-15 DIAGNOSIS — I110 Hypertensive heart disease with heart failure: Secondary | ICD-10-CM | POA: Insufficient documentation

## 2020-11-15 DIAGNOSIS — Z7901 Long term (current) use of anticoagulants: Secondary | ICD-10-CM | POA: Insufficient documentation

## 2020-11-15 DIAGNOSIS — I5022 Chronic systolic (congestive) heart failure: Secondary | ICD-10-CM | POA: Diagnosis not present

## 2020-11-15 DIAGNOSIS — Z8249 Family history of ischemic heart disease and other diseases of the circulatory system: Secondary | ICD-10-CM | POA: Insufficient documentation

## 2020-11-15 DIAGNOSIS — R531 Weakness: Secondary | ICD-10-CM | POA: Insufficient documentation

## 2020-11-15 LAB — CBC
HCT: 50.4 % — ABNORMAL HIGH (ref 36.0–46.0)
Hemoglobin: 16.4 g/dL — ABNORMAL HIGH (ref 12.0–15.0)
MCH: 29.7 pg (ref 26.0–34.0)
MCHC: 32.5 g/dL (ref 30.0–36.0)
MCV: 91.3 fL (ref 80.0–100.0)
Platelets: 266 10*3/uL (ref 150–400)
RBC: 5.52 MIL/uL — ABNORMAL HIGH (ref 3.87–5.11)
RDW: 19.5 % — ABNORMAL HIGH (ref 11.5–15.5)
WBC: 8.5 10*3/uL (ref 4.0–10.5)
nRBC: 0 % (ref 0.0–0.2)

## 2020-11-15 LAB — COMPREHENSIVE METABOLIC PANEL
ALT: 18 U/L (ref 0–44)
AST: 24 U/L (ref 15–41)
Albumin: 4.4 g/dL (ref 3.5–5.0)
Alkaline Phosphatase: 119 U/L (ref 38–126)
Anion gap: 20 — ABNORMAL HIGH (ref 5–15)
BUN: 38 mg/dL — ABNORMAL HIGH (ref 6–20)
CO2: 24 mmol/L (ref 22–32)
Calcium: 10.1 mg/dL (ref 8.9–10.3)
Chloride: 91 mmol/L — ABNORMAL LOW (ref 98–111)
Creatinine, Ser: 1.82 mg/dL — ABNORMAL HIGH (ref 0.44–1.00)
GFR, Estimated: 31 mL/min — ABNORMAL LOW (ref >60)
Glucose, Bld: 201 mg/dL — ABNORMAL HIGH (ref 70–99)
Potassium: 2.8 mmol/L — ABNORMAL LOW (ref 3.5–5.1)
Sodium: 135 mmol/L (ref 135–145)
Total Bilirubin: 1 mg/dL (ref 0.3–1.2)
Total Protein: 8.3 g/dL — ABNORMAL HIGH (ref 6.5–8.1)

## 2020-11-15 NOTE — Progress Notes (Signed)
Advanced HF Clinic Note   ID:  [name], [name] [birth year], MRN [id number]  Location: Home  Provider location: [place] Type of Visit: Established patient   PCP:  Scifres, [name], PA-C  Cardiologist:  No primary care provider on file. 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 Underwent emergent cath 02/03/18 showed LAD 100% stenosed, LCx 95% stenosed. Taken for emergent CABG 02/03/18. Required impella post op. Hospital course complicated by cardiogenic shock, HCAP, A fib, respiratory failure, and swallowing issues. She was discharged to SNF. Discharge weight 103 pounds.   In 2019 had multiple hospitalizations for HF and pleural effusion. Underwent pleurodesis at [facility].  Admitted 3/20 with NSTEMI and HF. Received DES to ostial ramus into distal left main based on cath below and underwent diuresis. Meds adjusted as tolerated. Echo with EF 25-30%.   She had a ReDS check on 5/8 with reading of 35%, but weight was up 2 lbs and she was SOB.  Admitted 11/21 with COPD flare with mild HF and hemoptysis..CTA showed stable scarring in the right hilum consistent with emphysema.. Underwent bronchoscopy on 11/15, no obvious source of bleeding found on examination.   Echo 11/21 EF 20-25%. RV mildly HK.   Had post hospital f/u on 12/1 and was fluid overloaded on device interrogation. She was instructed to take Metolazone 2.53m x 2 days + 40 mEq KCl and torsemide was increased from 40>>60 mg daily.   She returns today for f/u. She has diuresed and Optivol fluid index is way down. Impedence up above reference  curve. Based on grafts and exam, appears dry. She feels tired and weak. Her lung exam is clear but she continues to endorse exertional dyspnea and fatigue. Denies hemoptysis but notes occasional mild painless hematuria, which has been an issue in the past. No associated dysuria. She also endorses poor PO intake. Not much of an appetite. She tries to get in an Ensure 1-2 meals a day. No AF/ VT/VF on device interrogation.   ICD today: impedence up and index way down (appears dry). No VT/AF.  Personally reviewed  Echo 02/12/19: EF 25-30%, RV mildly reduced  LHC 02/12/19:  Ost LAD to Prox LAD lesion is 100% stenosed.  Ost Cx to Prox Cx lesion is 100% stenosed.  Ost Ramus lesion is 99% stenosed.  Post intervention, there is a 0% residual stenosis.  A drug-eluting stent was successfully placed using a [id number].  SVG and is normal in caliber.  The graft exhibits no disease.  Ost 1st Diag lesion is 70% stenosed.  SVG and is normal in caliber.  Prox Graft lesion is 100% stenosed. 1. Significant underlying two-vessel coronary artery disease with patent SVG to OM and SVG to diagonal which supplies the LAD territory. Atretic LIMA to LAD given competitive flow from SVG to diagonal. Severe ostial ramus artery stenosis not supplied by bypass with his area suggestive of plaque rupture. 2. Severely elevated left ventricular end-diastolic pressure 34 mmHg. Left ventricular angiography was not performed. 3. Successful angioplasty and drug-eluting stent placement to the ostial ramus extending into the distal left main.  Past Medical History:  Diagnosis  Date  . Acute systolic congestive heart failure ([place]) 02/03/2018  . Allergy   . Anxiety   . Asthma   . DM2 (diabetes mellitus, type 2) ([place]) 10/20/2020  . Hypertension   . PAF (paroxysmal atrial fibrillation) ([place])   . Prophylactic measure 08/03/14-08/19/14   Prophyl. cranial radiation 24 Gy  . S/P emergency CABG x 3 02/03/2018    LIMA to LAD, SVG to D1, SVG to OM1, EVH via right thigh with implantation of Impella LD LVAD via direct aortic approach  . Small cell lung cancer ([place]) 03/16/2014   Past Surgical History:  Procedure Laterality Date  . BRONCHIAL BRUSHINGS  10/25/2020   Procedure: BRONCHIAL BRUSHINGS;  Surgeon: Collene Gobble, MD;  Location: [facility] ENDOSCOPY;  Service: Pulmonary;;  . BRONCHIAL NEEDLE ASPIRATION BIOPSY  10/25/2020   Procedure: BRONCHIAL NEEDLE ASPIRATION BIOPSIES;  Surgeon: Collene Gobble, MD;  Location: [facility] ENDOSCOPY;  Service: Pulmonary;;  . CARDIAC DEFIBRILLATOR PLACEMENT  08/15/2018   MDT Visia AF MRI VR ICD implanted by Dr Loel Lofty for primary prevention of sudden  . CESAREAN SECTION    . CORONARY ARTERY BYPASS GRAFT N/A 02/03/2018   Procedure: CORONARY ARTERY BYPASS GRAFTING (CABG);  Surgeon: Rexene Alberts, MD;  Location: [place];  Service: Open Heart Surgery;  Laterality: N/A;  Time 3 using left internal mammary artery and endoscopically harvested right saphenous vein  . CORONARY BALLOON ANGIOPLASTY N/A 02/03/2018   Procedure: CORONARY BALLOON ANGIOPLASTY;  Surgeon: Martinique, Peter M, MD;  Location: [place] CV LAB;  Service: Cardiovascular;  Laterality: N/A;  . CORONARY STENT INTERVENTION N/A 02/12/2019   Procedure: CORONARY STENT INTERVENTION;  Surgeon: Wellington Hampshire, MD;  Location: [place] CV LAB;  Service: Cardiovascular;  Laterality: N/A;  . CORONARY/GRAFT ACUTE MI REVASCULARIZATION N/A 02/03/2018   Procedure: Coronary/Graft Acute MI Revascularization;  Surgeon: Martinique, Peter M, MD;  Location: [place] CV LAB;  Service: Cardiovascular;  Laterality: N/A;  . ENDOBRONCHIAL ULTRASOUND N/A 10/25/2020   Procedure: ENDOBRONCHIAL ULTRASOUND;  Surgeon: Collene Gobble, MD;  Location: [facility] ENDOSCOPY;  Service: Pulmonary;  Laterality: N/A;  . FLEXIBLE BRONCHOSCOPY  10/25/2020   Procedure: FLEXIBLE BRONCHOSCOPY;  Surgeon: Collene Gobble, MD;  Location: [facility] ENDOSCOPY;  Service: Pulmonary;;   . IABP INSERTION N/A 02/03/2018   Procedure: IABP Insertion;  Surgeon: Martinique, Peter M, MD;  Location: [place] CV LAB;  Service: Cardiovascular;  Laterality: N/A;  . INTRAOPERATIVE TRANSESOPHAGEAL ECHOCARDIOGRAM N/A 02/03/2018   Procedure: INTRAOPERATIVE TRANSESOPHAGEAL ECHOCARDIOGRAM;  Surgeon: Rexene Alberts, MD;  Location: [place];  Service: Open Heart Surgery;  Laterality: N/A;  . LEFT HEART CATH AND CORONARY ANGIOGRAPHY N/A 02/03/2018   Procedure: LEFT HEART CATH AND CORONARY ANGIOGRAPHY;  Surgeon: Martinique, Peter M, MD;  Location: [place] CV LAB;  Service: Cardiovascular;  Laterality: N/A;  . LEFT HEART CATH AND CORS/GRAFTS ANGIOGRAPHY N/A 02/12/2019   Procedure: LEFT HEART CATH AND CORS/GRAFTS ANGIOGRAPHY;  Surgeon: Wellington Hampshire, MD;  Location: [place] CV LAB;  Service: Cardiovascular;  Laterality: N/A;  . MEDIASTINOSCOPY N/A 03/11/2014   Procedure: MEDIASTINOSCOPY;  Surgeon: Melrose Nakayama, MD;  Location: [place];  Service: Thoracic;  Laterality: N/A;  . PLACEMENT OF [place] LEFT VENTRICULAR ASSIST DEVICE  02/03/2018   Procedure: PLACEMENT OF [place] LEFT VENTRICULAR ASSIST DEVICE LD;  Surgeon: Rexene Alberts, MD;  Location: [place];  Service: Open Heart Surgery;;  . REMOVAL OF IMPELLA LEFT VENTRICULAR ASSIST DEVICE N/A 02/08/2018   Procedure: REMOVAL OF IMPELLA LEFT VENTRICULAR ASSIST DEVICE;  Surgeon: Rexene Alberts, MD;  Location: [place];  Service: Open Heart Surgery;  Laterality: N/A;  . RIGHT HEART CATH N/A 02/03/2018   Procedure: RIGHT HEART CATH;  Surgeon: Martinique, Peter M, MD;  Location: [place] CV LAB;  Service: Cardiovascular;  Laterality: N/A;  . RIGHT HEART CATH N/A 05/09/2018   Procedure: RIGHT HEART CATH;  Surgeon: Jolaine Artist, MD;  Location: [place] CV LAB;  Service: Cardiovascular;  Laterality: N/A;  . TEE WITHOUT CARDIOVERSION N/A 02/08/2018   Procedure: TRANSESOPHAGEAL ECHOCARDIOGRAM (TEE);  Surgeon: Rexene Alberts, MD;  Location: [place];  Service:  Open Heart Surgery;  Laterality: N/A;  . TUBAL LIGATION    . VIDEO BRONCHOSCOPY WITH ENDOBRONCHIAL ULTRASOUND N/A 03/11/2014   Procedure: VIDEO BRONCHOSCOPY WITH ENDOBRONCHIAL ULTRASOUND;  Surgeon: Melrose Nakayama, MD;  Location: [place];  Service: Thoracic;  Laterality: N/A;     Current Outpatient Medications  Medication Sig Dispense Refill  . albuterol (VENTOLIN HFA) 108 (90 Base) MCG/ACT inhaler Inhale 2 puffs into the lungs every 6 (six) hours as needed for wheezing or shortness of breath. 1 Inhaler 0  . ALPRAZolam (XANAX) 0.25 MG tablet Take 1 tablet (0.25 mg total) by mouth daily as needed for anxiety. 10 tablet 0  . aspirin EC 81 MG tablet Take 1 tablet (81 mg total) by mouth daily. Swallow whole. 30 tablet 3  . atorvastatin (LIPITOR) 80 MG tablet Take 1 tablet (80 mg total) by mouth daily at 6 PM. 30 tablet 1  . dapagliflozin propanediol (FARXIGA) 10 MG TABS tablet Take 1 tablet (10 mg total) by mouth daily before breakfast. 30 tablet 6  . feeding supplement (ENSURE ENLIVE / ENSURE PLUS) LIQD Take 237 mLs by mouth 3 (three) times daily between meals. 237 mL 12  . ipratropium-albuterol (DUONEB) 0.5-2.5 (3) MG/3ML SOLN Take 3 mLs by nebulization 2 (two) times daily as needed.    . ivabradine (CORLANOR) 7.5 MG TABS tablet Take 1 tablet (7.5 mg total) by mouth 2 (two) times daily with a meal. 60 tablet 6  . metolazone (ZAROXOLYN) 2.5 MG tablet Take 1 tablet (2.5 mg total) by mouth as directed. Take today and tomorrow and then hold till directed 10 tablet 0  . nitroGLYCERIN (NITROSTAT) 0.4 MG SL tablet Place 1 tablet (0.4 mg total) under the tongue every 5 (five) minutes as needed for chest pain. 90 tablet 5  . potassium chloride SA (KLOR-CON) 20 MEQ tablet Take 2 tablets (40 mEq total) by mouth daily. Take 22mq (2 tablets) with metolazone doses 180 tablet 3  . spironolactone (ALDACTONE) 25 MG tablet Take 1 tablet (25 mg total) by mouth daily. 30 tablet 6  . torsemide (DEMADEX) 20 MG tablet  Take 3 tablets (60 mg total) by mouth daily. Take 40 mg daily. 90 tablet 3   No current facility-administered medications for this encounter.    Allergies:   Codeine   Social History:  The patient  reports that she quit smoking about 6 years ago. Her smoking use included cigarettes. She has a 20.00 pack-year smoking history. She has never used smokeless tobacco. She reports current alcohol use of about 8.0 standard drinks of alcohol per week. She reports current drug use. Drug: Marijuana.   Family History:  The patient's family history includes Cancer in her maternal grandmother; Diabetes in her paternal grandmother; Heart attack in her mother; Heart disease in her mother; Hypertension in her maternal grandmother and mother.   ROS:  Please see the history of  present illness.   All other systems are personally reviewed and negative.    Vitals:   11/15/20 0817  BP: 105/67  Pulse: 99  SpO2: 99%  Weight: 46.4 kg (102 lb 3.2 oz)   PHYSICAL EXAM: General:  fatigue appearing/ thin No respiratory difficulty HEENT: normal Neck: supple. no JVD. Carotids 2+ bilat; no bruits. No lymphadenopathy or thyromegaly appreciated. Cor: PMI nondisplaced. Regular rate & rhythm. No rubs, gallops or murmurs. Lungs: clear, no wheezing or rhonchi Abdomen: soft, nontender, nondistended. No hepatosplenomegaly. No bruits or masses. Good bowel sounds. Extremities: thine extremities no cyanosis, clubbing, rash, edema Neuro: alert & oriented x 3, cranial nerves grossly intact. moves all 4 extremities w/o difficulty. Affect pleasant.   EKG: not performed today.   ICD today:  impedence up above reference curve, index day down. No VT/AF. Personally reviewed   Recent Labs: 10/20/2020: ALT 15; B Natriuretic Peptide 1,118.4 11/10/2020: BUN 14; Creatinine, Ser 0.95; Potassium 2.9; Sodium 141 11/15/2020: Hemoglobin 16.4; Platelets 266  Personally reviewed   Wt Readings from Last 3 Encounters:  11/15/20 46.4 kg (102  lb 3.2 oz)  11/10/20 48.2 kg (106 lb 3.2 oz)  10/26/20 47.7 kg (105 lb 3.2 oz)     ASSESSMENT AND PLAN:  1.Chronicsystolic HF due to ICM. S/p Medtronic single chamber ICD. -Echo 09/04/18 ([facility]) LVEF 20%, Moderate AI, Mild MR, Mild TR, Severe LAE, RV mildly decreased.  - Echo 02/12/19: EF 25-30%, RV mildly reduced - Echo 11/21 EF 20-25% RV mild HK - NYHA III - Volume status improved by Optivol after diuretic increase. Think she is dry/overdiuresed. Hold torsemide x 2 days, then resume at alternating dosing 60 mg one day and 40 mg the next. No further metolazone for now. Check BMP today.   - Continue spiro 25 mg qHS.  - Continue corlanor 7.5 bid - Consider low-dose losartan at next visit if SCr ok. Suspect she is dry today. Needs BMP to r/o AKI   - No b-blocker yet due to intolerance with low output - has ICD. No VT/VF   2. CAD  - Hx of NSTEMI/STEMI s/p Emergent CABG 02/03/18: Cath with severe 2v CAD as above with severe LV dysfunction/ICM. s/p Emergent CABG 02/03/18. - Had NSTEMI and now s/p LHC 02/12/19 with DES to the ostial ramus extending into the distal left main - ASA + Brilinta stopped at recent discharge due to bleeding - back on  ASA - stable w/o CP   3. Recurrent pleural effusions s/p pleurodesis - Trace pleural effusion on right on CXR on recent admit.  - lungs clear on exam today   4. Chronic hypoxic respiratory failure - multifactorial; lung exam clear today. No pulmonary edema based on Optivol today - Wears O2 PRN at home.  - she is followed by pulmonology   5. PAF - CHA2DS2/VASc is at least 4. (CHF, Vasc disease, HTN, Female).  - She has history of Afib RVR in the past in the setting of her Lung CA. Previously on Xarelto but stopped.  - cont ASA - No afib byICD interrogation today   6. 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  - Chest CT 02/19/18 with mass-like consolidation in R hilum concerning for recurrent tumor.  - Repeat CT 03/27/2018  -No definite findings of locally recurrent tumor in the right lung. 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 Continued chest CT surveillance is advised in 3-6 months. - S/p thoracentesis  04/2018. Cytology negative for malignancy.  - CTA  11/19 stable scarring in the right hilum consistent with emphysema. Bronchoscopy on 11/15, no obvious source of bleeding  7. Anxiety/Depression - Stable - Continue zoloft 50 daily. Per PCP.   8. Fatigue - suspect multifactorial given cardiac/pulmonary issues outlined above, but given recent bleeding, will check CBC to r/o anemia   Volume status followed closely in device clinic, through [facility] analysis. F/u in 2 month w/ Dr. [name] on sooner if needed.   Signed, Lyda Jester, PA-C  11/15/2020 9:56 AM

## 2020-11-15 NOTE — Patient Instructions (Signed)
HOLD Torsemide today, and then resume tomorrow  Labs done today, your results will be available in MyChart, we will contact you for abnormal readings.  Your physician recommends that you schedule a follow-up appointment in: 2 months  If you have any questions or concerns before your next appointment please send [place] a message through [place] or call our office at [phone].    TO LEAVE A MESSAGE FOR THE NURSE SELECT OPTION 2, PLEASE LEAVE A MESSAGE INCLUDING: . YOUR NAME . DATE OF BIRTH . CALL BACK NUMBER . REASON FOR CALL**this is important as we prioritize the call backs  YOU WILL RECEIVE A CALL BACK THE SAME DAY AS LONG AS YOU CALL BEFORE 4:00 PM

## 2020-11-15 NOTE — Progress Notes (Signed)
Received: 2 days ago Bensimhon, Shaune Pascal, MD  Eryn Krejci Panda, RN; Thompson Grayer, MD Thanks

## 2020-11-16 ENCOUNTER — Telehealth (HOSPITAL_COMMUNITY): Payer: Self-pay

## 2020-11-16 MED ORDER — TORSEMIDE 20 MG PO TABS: 40.0000 mg | ORAL_TABLET | ORAL | 3 refills | Status: DC

## 2020-11-16 MED ORDER — TORSEMIDE 20 MG PO TABS
60.0000 mg | ORAL_TABLET | ORAL | 3 refills | Status: DC
Start: 2020-11-16 — End: 2021-01-27

## 2020-11-16 MED ORDER — POTASSIUM CHLORIDE CRYS ER 20 MEQ PO TBCR
20.0000 meq | EXTENDED_RELEASE_TABLET | Freq: Every day | ORAL | 3 refills | Status: DC
Start: 2020-11-16 — End: 2021-01-31

## 2020-11-16 NOTE — Telephone Encounter (Signed)
Malena Edman, RN  11/16/2020 12:49 PM EST Back to Top    Patient advised and verbalized understanding. Med list updated. Patient states she will call back to make lab appointment at a later time.   Malena Edman, RN  11/15/2020 2:27 PM EST     Tried calling patient, no answer, no voicemail setup. Will try again later

## 2020-11-16 NOTE — Telephone Encounter (Signed)
-----   Message from Consuelo Pandy, [place] sent at 11/15/2020  2:24 PM EST ----- Labs suggest dehydration and hypokalemia. Hold torsemide x 2 days, then resume at alternating dosing 60 mg one day and 40 mg the next. Take 40 mEq of KCl today and take 20 mEq in AM. Come back for BMP mid day tomorrow.

## 2020-11-19 ENCOUNTER — Other Ambulatory Visit (HOSPITAL_COMMUNITY): Payer: Self-pay

## 2020-11-19 MED ORDER — IVABRADINE HCL 7.5 MG PO TABS
7.5000 mg | ORAL_TABLET | Freq: Two times a day (BID) | ORAL | 5 refills | Status: DC
Start: 2020-11-19 — End: 2021-10-10

## 2020-11-23 ENCOUNTER — Inpatient Hospital Stay: Payer: Self-pay | Admitting: Pulmonary Disease

## 2020-11-29 ENCOUNTER — Telehealth: Payer: Self-pay

## 2020-11-29 ENCOUNTER — Ambulatory Visit (INDEPENDENT_AMBULATORY_CARE_PROVIDER_SITE_OTHER): Payer: BC Managed Care – PPO

## 2020-11-29 DIAGNOSIS — I5022 Chronic systolic (congestive) heart failure: Secondary | ICD-10-CM

## 2020-11-29 DIAGNOSIS — Z95810 Presence of automatic (implantable) cardiac defibrillator: Secondary | ICD-10-CM | POA: Diagnosis not present

## 2020-11-29 NOTE — Telephone Encounter (Signed)
Remote ICM transmission received.  Attempted call to patient regarding ICM remote transmission and she was unable to talk at this time.  She requested a call back later.

## 2020-11-29 NOTE — Progress Notes (Signed)
EPIC Encounter for ICM Monitoring  Patient Name: Brittany [name] is a 59 y.o. female Date: 11/29/2020 Primary Care Physican: Scifres, [name], PA-C Primary Cardiologist:Bensimhon Electrophysiologist:Allred 11/30/2020 Weight: 105 lbs  Attempted call to patient and she was at her ex-husband's memorial. [name] reviewed.   OptivolThoracic impedancesuggesting possible fluid accumulation starting 11/22/2020.  Prescribed:   Torsemide20 mgTake 3 tablets (60 mg total) every other day alternating with2tablets (40 mg total) every other day.   Potassium 20 mEq take 1 tablet by mouth daily.  Take 2 tablets (40 mEq total) with Metolzone  Metolazone 2.5 mg on hold starting 11/15/2020 per [name] office visit note   Spironolactone 25 mg take 1 tablet daily  Labs: 11/10/2020 Creatinine 0.95, BUN 14, Potassium 2.9, Sodium 141, GFR >60 10/26/2020 Creatinine1.28, BUN45, Potassium4.4, [id number], LOV56 10/25/2020 Creatinine1.07, BUN31, Potassium4.0, [id number], GFR59  10/24/2020 Creatinine1.02, BUN35, Potassium4.2, [id number], GFR>60  10/23/2020 Creatinine1.16, BUN41, Potassium4.2, [id number], GFR54  10/22/2020 Creatinine1.40, BUN34, Potassium4.8, Sodium137, GFR>60  10/21/2020 Creatinine1.03, BUN17, Potassium4.9, Sodium137, GFR>60 A complete set of results can be found in Results Review.  Recommendations: Patient requested call back.  Follow-up plan: ICM clinic phone appointment on12/28/2021 to recheck fluid levels. 91 day device clinic remote transmission 01/04/2021.  EP/Cardiology Office Visits: 01/17/2021 with Advanced HF clinic.  Copy of ICM check sent to Dr.Allred and Dr [name].   3 month ICM trend: 11/29/2020    1 Year ICM trend:       [name], RN 11/29/2020 10:47 AM

## 2020-11-30 NOTE — Progress Notes (Signed)
Spoke with patient.  She reports some shortness of breath in the last week which correlates with decreased impedance.  She has not been taking Torsemide as prescribed at last OV with heart failure clinic on 11/15/2020.  She has been taking Torsemide 40 mg daily but should be taking Torsemide 60 mg every other day alternating with 40 mg every other day.  Advised to take as prescribed and will recheck fluid levels next week.  Encouraged to limit salt intake.  Current weight is 102 lbs. She does not have much of an appetite due to stress of 3 family deaths within last 4 months.  Encouraged to call for any changes.

## 2020-12-07 ENCOUNTER — Ambulatory Visit (INDEPENDENT_AMBULATORY_CARE_PROVIDER_SITE_OTHER): Payer: Medicare Other

## 2020-12-07 DIAGNOSIS — I5022 Chronic systolic (congestive) heart failure: Secondary | ICD-10-CM

## 2020-12-07 DIAGNOSIS — Z95810 Presence of automatic (implantable) cardiac defibrillator: Secondary | ICD-10-CM

## 2020-12-08 NOTE — Progress Notes (Signed)
EPIC Encounter for ICM Monitoring  Patient Name: Brittany [name] is a 59 y.o. female Date: 12/08/2020 Primary Care Physican: Scifres, [name], PA-C Primary Cardiologist:Bensimhon Electrophysiologist:Allred 11/30/2020 Weight: 105 lbs  Spoke with patient and reports feeling well at this time.  Denies fluid symptoms.    OptivolThoracic impedancesuggestingfluid levels returned to normal after she started taking Torsemide correct dosage.  Prescribed:   Torsemide20 mgTake 3 tablets (60 mg total) every other day alternating with2tablets (40 mg total) every other day.   Potassium 20 mEq take1 tablet by mouth daily.  Take 2tablets (40 mEq total)with Metolzone  Metolazone 2.5 mg on hold starting 11/15/2020 per [name] office visit note   Spironolactone 25 mg take 1 tablet daily  Labs: 11/10/2020 Creatinine 0.95, BUN 14, Potassium 2.9, Sodium 141, GFR >60 10/26/2020 Creatinine1.28, BUN45, Potassium4.4, [id number], PRF16 10/25/2020 Creatinine1.07, BUN31, Potassium4.0, [id number], GFR59  10/24/2020 Creatinine1.02, BUN35, Potassium4.2, [id number], GFR>60  10/23/2020 Creatinine1.16, BUN41, Potassium4.2, [id number], GFR54  10/22/2020 Creatinine1.40, BUN34, Potassium4.8, Sodium137, GFR>60  10/21/2020 Creatinine1.03, BUN17, Potassium4.9, Sodium137, GFR>60 A complete set of results can be found in Results Review.  Recommendations:No changes and encouraged to call if experiencing any fluid symptoms.  Follow-up plan: ICM clinic phone appointment on2/12/2020. 91 day device clinic remote transmission 01/04/2021.  EP/Cardiology Office Visits: 01/17/2021 withAdvanced HF clinic.  Copy of ICM check sent to Dr.Allred and Dr [name].  3 month ICM trend: 12/07/2020    1 Year ICM trend:       Brittany Billings, RN 12/08/2020 4:03 PM

## 2020-12-22 ENCOUNTER — Inpatient Hospital Stay: Payer: Self-pay | Admitting: Pulmonary Disease

## 2021-01-04 ENCOUNTER — Ambulatory Visit (INDEPENDENT_AMBULATORY_CARE_PROVIDER_SITE_OTHER): Payer: BC Managed Care – PPO

## 2021-01-04 DIAGNOSIS — I5022 Chronic systolic (congestive) heart failure: Secondary | ICD-10-CM | POA: Diagnosis not present

## 2021-01-04 LAB — CUP PACEART REMOTE DEVICE CHECK
Battery Remaining Longevity: 119 mo
Battery Voltage: 3.03 V
Brady Statistic RV Percent Paced: 0 %
Date Time Interrogation Session: 20220125012504
HighPow Impedance: 76 Ohm
Implantable Lead Implant Date: 20190905
Implantable Lead Location: 753860
Implantable Pulse Generator Implant Date: 20190905
Lead Channel Impedance Value: 323 Ohm
Lead Channel Impedance Value: 456 Ohm
Lead Channel Pacing Threshold Amplitude: 0.75 V
Lead Channel Pacing Threshold Pulse Width: 0.4 ms
Lead Channel Sensing Intrinsic Amplitude: 19 mV
Lead Channel Sensing Intrinsic Amplitude: 19 mV
Lead Channel Setting Pacing Amplitude: 2 V
Lead Channel Setting Pacing Pulse Width: 0.4 ms
Lead Channel Setting Sensing Sensitivity: 0.3 mV

## 2021-01-11 ENCOUNTER — Ambulatory Visit (INDEPENDENT_AMBULATORY_CARE_PROVIDER_SITE_OTHER): Payer: BC Managed Care – PPO

## 2021-01-11 DIAGNOSIS — I5022 Chronic systolic (congestive) heart failure: Secondary | ICD-10-CM

## 2021-01-11 DIAGNOSIS — Z95810 Presence of automatic (implantable) cardiac defibrillator: Secondary | ICD-10-CM | POA: Diagnosis not present

## 2021-01-14 ENCOUNTER — Telehealth (HOSPITAL_COMMUNITY): Payer: Self-pay | Admitting: *Deleted

## 2021-01-14 NOTE — Telephone Encounter (Signed)
Pt left VM to set up transportation for Monday. I called pt back no answer/no vm set up.

## 2021-01-14 NOTE — Progress Notes (Incomplete)
Advanced HF Clinic Note   ID:  Briannie, Gutierrez 12-21-1960, MRN [id number]  Location: Home  Provider location: [place] Type of Visit: Established patient   PCP:  Scifres, [name], PA-C  Cardiologist:  No primary care provider on file. 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 Underwent emergent cath 02/03/18 showed LAD 100% stenosed, LCx 95% stenosed. Taken for emergent CABG 02/03/18. Required impella post op. Hospital course complicated by cardiogenic shock, HCAP, A fib, respiratory failure, and swallowing issues. She was discharged to SNF. Discharge weight 103 pounds.   In 2019 had multiple hospitalizations for HF and pleural effusion. Underwent pleurodesis at [facility].  Admitted 3/20 with NSTEMI and HF. Received DES to ostial ramus into distal left main based on cath below and underwent diuresis. Meds adjusted as tolerated. Echo with EF 25-30%.   She had a ReDS check on 5/8 with reading of 35%, but weight was up 2 lbs and she was SOB.  Admitted 11/21 with COPD flare with mild HF and hemoptysis..CTA showed stable scarring in the right hilum consistent with emphysema.. Underwent bronchoscopy on 11/15, no obvious source of bleeding found on examination.   Echo 11/21 EF 20-25%. RV mildly HK.   Had post hospital f/u on 12/1 and was fluid overloaded on device interrogation. She was instructed to take Metolazone 2.78m x 2 days + 40 mEq KCl and torsemide was increased from 40>>60 mg daily.   Returned 12/21 for f/u. She has diuresed and Optivol fluid index is way down. Based on grafts and exam, appears  dry. She feels tired and weak. Her lung exam is clear but she continues to endorse exertional dyspnea and fatigue. Denies hemoptysis but notes occasional mild painless hematuria, which has been an issue in the past. Poor PO intake. She tries to get in an Ensure 1-2 meals a day. No AF/ VT/VF on device interrogation. Torsemide held for 2 days then reduced to 60 mg one day and 40 mg the next.  Today she returns for HF follow up. Overall feeling fine. Denies increasing SOB, CP, dizziness, edema, or PND/Orthopnea. Appetite ok. No fever or chills. Weight at home 170 pounds. Taking all medications.    Echo 02/12/19: EF 25-30%, RV mildly reduced  LHC 02/12/19:  Ost LAD to Prox LAD lesion is 100% stenosed.  Ost Cx to Prox Cx lesion is 100% stenosed.  Ost Ramus lesion is 99% stenosed.  Post intervention, there is a 0% residual stenosis.  A drug-eluting stent was successfully placed using a SBird-in-[id number].  SVG and is normal in caliber.  The graft exhibits no disease.  Ost 1st Diag lesion is 70% stenosed.  SVG and is normal in caliber.  Prox Graft lesion is 100% stenosed. 1. Significant underlying two-vessel coronary artery disease with patent SVG to OM and SVG to diagonal which supplies the LAD territory. Atretic LIMA to LAD given competitive flow from SVG to diagonal. Severe ostial ramus artery stenosis not supplied by bypass with his area suggestive of plaque rupture. 2. Severely elevated left ventricular end-diastolic pressure 34 mmHg. Left ventricular angiography was not performed. 3. Successful angioplasty and drug-eluting  stent placement to the ostial ramus extending into the distal left main.  Past Medical History:  Diagnosis Date  . Acute systolic congestive heart failure ([place]) 02/03/2018  . Allergy   . Anxiety   . Asthma   . DM2 (diabetes mellitus, type 2) ([place]) 10/20/2020  . Hypertension   . PAF (paroxysmal atrial fibrillation) ([place])   . Prophylactic measure  08/03/14-08/19/14   Prophyl. cranial radiation 24 Gy  . 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 Small cell lung cancer ([place]) 03/16/2014   Past Surgical History:  Procedure Laterality Date  . BRONCHIAL BRUSHINGS  10/25/2020   Procedure: BRONCHIAL BRUSHINGS;  Surgeon: Collene Gobble, MD;  Location: [facility] ENDOSCOPY;  Service: Pulmonary;;  . BRONCHIAL NEEDLE ASPIRATION BIOPSY  10/25/2020   Procedure: BRONCHIAL NEEDLE ASPIRATION BIOPSIES;  Surgeon: Collene Gobble, MD;  Location: [facility] ENDOSCOPY;  Service: Pulmonary;;  . CARDIAC DEFIBRILLATOR PLACEMENT  08/15/2018   MDT Visia AF MRI VR ICD implanted by Dr Loel Lofty for primary prevention of sudden  . CESAREAN SECTION    . CORONARY ARTERY BYPASS GRAFT N/A 02/03/2018   Procedure: CORONARY ARTERY BYPASS GRAFTING (CABG);  Surgeon: Rexene Alberts, MD;  Location: [place];  Service: Open Heart Surgery;  Laterality: N/A;  Time 3 using left internal mammary artery and endoscopically harvested right saphenous vein  . CORONARY BALLOON ANGIOPLASTY N/A 02/03/2018   Procedure: CORONARY BALLOON ANGIOPLASTY;  Surgeon: Martinique, Peter M, MD;  Location: [place] CV LAB;  Service: Cardiovascular;  Laterality: N/A;  . CORONARY STENT INTERVENTION N/A 02/12/2019   Procedure: CORONARY STENT INTERVENTION;  Surgeon: Wellington Hampshire, MD;  Location: [place] CV LAB;  Service: Cardiovascular;  Laterality: N/A;  . CORONARY/GRAFT ACUTE MI REVASCULARIZATION N/A 02/03/2018   Procedure: Coronary/Graft Acute MI Revascularization;  Surgeon: Martinique, Peter M, MD;  Location: [place] CV LAB;  Service: Cardiovascular;  Laterality: N/A;  . ENDOBRONCHIAL ULTRASOUND N/A 10/25/2020   Procedure: ENDOBRONCHIAL ULTRASOUND;  Surgeon: Collene Gobble, MD;  Location: [facility] ENDOSCOPY;  Service: Pulmonary;  Laterality: N/A;  . FLEXIBLE BRONCHOSCOPY  10/25/2020   Procedure: FLEXIBLE  BRONCHOSCOPY;  Surgeon: Collene Gobble, MD;  Location: [facility] ENDOSCOPY;  Service: Pulmonary;;  . IABP INSERTION N/A 02/03/2018   Procedure: IABP Insertion;  Surgeon: Martinique, Peter M, MD;  Location: [place] CV LAB;  Service: Cardiovascular;  Laterality: N/A;  . INTRAOPERATIVE TRANSESOPHAGEAL ECHOCARDIOGRAM N/A 02/03/2018   Procedure: INTRAOPERATIVE TRANSESOPHAGEAL ECHOCARDIOGRAM;  Surgeon: Rexene Alberts, MD;  Location: [place];  Service: Open Heart Surgery;  Laterality: N/A;  . LEFT HEART CATH AND CORONARY ANGIOGRAPHY N/A 02/03/2018   Procedure: LEFT HEART CATH AND CORONARY ANGIOGRAPHY;  Surgeon: Martinique, Peter M, MD;  Location: [place] CV LAB;  Service: Cardiovascular;  Laterality: N/A;  . LEFT HEART CATH AND CORS/GRAFTS ANGIOGRAPHY N/A 02/12/2019   Procedure: LEFT HEART CATH AND CORS/GRAFTS ANGIOGRAPHY;  Surgeon: Wellington Hampshire, MD;  Location: [place] CV LAB;  Service: Cardiovascular;  Laterality: N/A;  . MEDIASTINOSCOPY N/A 03/11/2014   Procedure: MEDIASTINOSCOPY;  Surgeon: Melrose Nakayama, MD;  Location: [place];  Service: Thoracic;  Laterality: N/A;  . PLACEMENT OF [place] LEFT VENTRICULAR ASSIST DEVICE  02/03/2018   Procedure: PLACEMENT OF [place] LEFT VENTRICULAR ASSIST DEVICE LD;  Surgeon: Rexene Alberts, MD;  Location: [place];  Service: Open Heart Surgery;;  . REMOVAL  OF IMPELLA LEFT VENTRICULAR ASSIST DEVICE N/A 02/08/2018   Procedure: REMOVAL OF [place] LEFT VENTRICULAR ASSIST DEVICE;  Surgeon: Rexene Alberts, MD;  Location: [place];  Service: Open Heart Surgery;  Laterality: N/A;  . RIGHT HEART CATH N/A 02/03/2018   Procedure: RIGHT HEART CATH;  Surgeon: Martinique, Peter M, MD;  Location: [place] CV LAB;  Service: Cardiovascular;  Laterality: N/A;  . RIGHT HEART CATH N/A 05/09/2018   Procedure: RIGHT HEART CATH;  Surgeon: Jolaine Artist, MD;  Location: [place] CV LAB;  Service: Cardiovascular;  Laterality: N/A;  . TEE WITHOUT CARDIOVERSION N/A 02/08/2018   Procedure:  TRANSESOPHAGEAL ECHOCARDIOGRAM (TEE);  Surgeon: Rexene Alberts, MD;  Location: [place];  Service: Open Heart Surgery;  Laterality: N/A;  . TUBAL LIGATION    . VIDEO BRONCHOSCOPY WITH ENDOBRONCHIAL ULTRASOUND N/A 03/11/2014   Procedure: VIDEO BRONCHOSCOPY WITH ENDOBRONCHIAL ULTRASOUND;  Surgeon: Melrose Nakayama, MD;  Location: [place];  Service: Thoracic;  Laterality: N/A;     Current Outpatient Medications  Medication Sig Dispense Refill  . albuterol (VENTOLIN HFA) 108 (90 Base) MCG/ACT inhaler Inhale 2 puffs into the lungs every 6 (six) hours as needed for wheezing or shortness of breath. 1 Inhaler 0  . ALPRAZolam (XANAX) 0.25 MG tablet Take 1 tablet (0.25 mg total) by mouth daily as needed for anxiety. 10 tablet 0  . aspirin EC 81 MG tablet Take 1 tablet (81 mg total) by mouth daily. Swallow whole. 30 tablet 3  . atorvastatin (LIPITOR) 80 MG tablet Take 1 tablet (80 mg total) by mouth daily at 6 PM. 30 tablet 1  . dapagliflozin propanediol (FARXIGA) 10 MG TABS tablet Take 1 tablet (10 mg total) by mouth daily before breakfast. 30 tablet 6  . feeding supplement (ENSURE ENLIVE / ENSURE PLUS) LIQD Take 237 mLs by mouth 3 (three) times daily between meals. 237 mL 12  . ipratropium-albuterol (DUONEB) 0.5-2.5 (3) MG/3ML SOLN Take 3 mLs by nebulization 2 (two) times daily as needed.    . ivabradine (CORLANOR) 7.5 MG TABS tablet Take 1 tablet (7.5 mg total) by mouth 2 (two) times daily with a meal. 60 tablet 5  . metolazone (ZAROXOLYN) 2.5 MG tablet Take 1 tablet (2.5 mg total) by mouth as directed. Take today and tomorrow and then hold till directed 10 tablet 0  . nitroGLYCERIN (NITROSTAT) 0.4 MG SL tablet Place 1 tablet (0.4 mg total) under the tongue every 5 (five) minutes as needed for chest pain. 90 tablet 5  . potassium chloride SA (KLOR-CON) 20 MEQ tablet Take 1 tablet (20 mEq total) by mouth daily. Take 55mq (2 tablets) with metolazone doses 90 tablet 3  . spironolactone (ALDACTONE) 25 MG  tablet Take 1 tablet (25 mg total) by mouth daily. 30 tablet 6  . torsemide (DEMADEX) 20 MG tablet Take 3 tablets (60 mg total) by mouth every other day. 135 tablet 3  . torsemide (DEMADEX) 20 MG tablet Take 2 tablets (40 mg total) by mouth every other day. 90 tablet 3   No current facility-administered medications for this visit.    Allergies:   Codeine   Social History:  The patient  reports that she quit smoking about 6 years ago. Her smoking use included cigarettes. She has a 20.00 pack-year smoking history. She has never used smokeless tobacco. She reports current alcohol use of about 8.0 standard drinks of alcohol per week. She reports current drug use. Drug: Marijuana.   Family History:  The patient's family  history includes Cancer in her maternal grandmother; Diabetes in her paternal grandmother; Heart attack in her mother; Heart disease in her mother; Hypertension in her maternal grandmother and mother.   ROS:  Please see the history of present illness.   All other systems are personally reviewed and negative.  Recent Labs: 10/20/2020: B Natriuretic Peptide 1,118.4 11/15/2020: ALT 18; BUN 38; Creatinine, Ser 1.82; Hemoglobin 16.4; Platelets 266; Potassium 2.8; Sodium 135  Personally reviewed   Wt Readings from Last 3 Encounters:  11/15/20 46.4 kg (102 lb 3.2 oz)  11/10/20 48.2 kg (106 lb 3.2 oz)  10/26/20 47.7 kg (105 lb 3.2 oz)    There were no vitals filed for this visit. There is no height or weight on file to calculate BMI.  PHYSICAL EXAM: General:  NAD. Fatigued-appearing. No resp difficulty HEENT: Normal Neck: Supple. No JVD. Carotids 2+ bilat; no bruits. No lymphadenopathy or thryomegaly appreciated. Cor: PMI nondisplaced. Regular rate & rhythm. No rubs, gallops or murmurs. Lungs: Clear Abdomen: Soft, nontender, nondistended. No hepatosplenomegaly. No bruits or masses. Good bowel sounds. Extremities: No cyanosis, clubbing, rash, edema Neuro: alert & oriented x 3,  cranial nerves grossly intact. Moves all 4 extremities w/o difficulty. Affect pleasant.  EKG: not performed today.  ICD today:  impedence up above reference curve, index day down. No VT/AF. Personally reviewed   ASSESSMENT AND PLAN:  1.Chronicsystolic HF due to ICM. S/p Medtronic single chamber ICD. -Echo 09/04/18 ([facility]) LVEF 20%, Moderate AI, Mild MR, Mild TR, Severe LAE, RV mildly decreased.  - Echo 02/12/19: EF 25-30%, RV mildly reduced - Echo 11/21 EF 20-25% RV mild HK - NYHA III - Volume status improved by Optivol after diuretic increase. Continue torsemide 60 mg one day and 40 mg the next. No further metolazone for now. Check BMP today.   - Continue spiro 25 mg qHS.  - Continue corlanor 7.5 bid - Consider low-dose losartan at next visit if SCr ok. Suspect she is dry today. Needs BMP to r/o AKI   - No b-blocker yet due to intolerance with low output - has ICD. No VT/VF   2. CAD  - Hx of NSTEMI/STEMI s/p Emergent CABG 02/03/18: Cath with severe 2v CAD as above with severe LV dysfunction/ICM. s/p Emergent CABG 02/03/18. - Had NSTEMI and now s/p LHC 02/12/19 with DES to the ostial ramus extending into the distal left main. - ASA + Brilinta stopped at recent discharge due to bleeding. - back on  ASA. - stable w/o CP.   3. Recurrent pleural effusions s/p pleurodesis - Trace pleural effusion on right on CXR on recent admit.  - Lungs clear on exam today   4. Chronic hypoxic respiratory failure - multifactorial; lung exam clear today. No pulmonary edema based on Optivol today - Wears O2 PRN at home.  - She is followed by pulmonology   5. PAF - CHA2DS2/VASc is at least 4. (CHF, Vasc disease, HTN, Female).  - She has history of Afib RVR in the past in the setting of her Lung CA. Previously on Xarelto but stopped.  - Cont ASA - No afib byICD interrogation today.   6. 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  - Chest CT 02/19/18 with mass-like consolidation in R  hilum concerning for recurrent tumor.  - Repeat CT 03/27/2018 -No definite findings of locally recurrent tumor in the right lung. 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 Continued chest CT  surveillance is advised in 3-6 months. - S/p thoracentesis 04/2018. Cytology negative for malignancy.  - CTA 11/19 stable scarring in the right hilum consistent with emphysema. Bronchoscopy on 11/15, no obvious source of bleeding  7. Anxiety/Depression - Stable. - Continue zoloft 50 daily. Per PCP.   8. Fatigue - Suspect multifactorial given cardiac/pulmonary issues outlined above.  - CBC (12/21) stable, no anemia.  Volume status followed closely in device clinic, through [employer]. F/u in 2 month w/ Dr. [name] on sooner if needed.   Frankey Poot, FNP-BC  01/14/2021 9:20 AM

## 2021-01-15 NOTE — Progress Notes (Signed)
Remote ICD transmission.   

## 2021-01-17 ENCOUNTER — Encounter (HOSPITAL_COMMUNITY): Payer: Medicare Other

## 2021-01-17 NOTE — Progress Notes (Signed)
EPIC Encounter for ICM Monitoring  Patient Name: Brittany [name] is a 60 y.o. female Date: 01/17/2021 Primary Care Physican: Scifres, [name], PA-C Primary Cardiologist:Bensimhon Electrophysiologist:Allred 01/15/2021 Weight: 101 lbs  Spoke with patient and reports having tightness in chest during decreased impedance.  She did take extra Torsemide during decreased impedance.  She losing weight due to loss of appetite.  Today she has an earache and feeling congested.  She missed her appointment with Dr [name] today, 01/17/2021 and will call to reschedule.    OptivolThoracic impedancenormal but was suggestingpossible fluid accumulation from 12/14/2020 - 01/09/2021.  Prescribed:   Torsemide20 mgTake3 tablets (60 mg total) every other day alternating with2tablets (40 mg total)every other day.   Potassium 20 mEq take1 tablet by mouth daily. Take 2tablets (40 mEq total)with Metolzone  Metolazone 2.5 mg on hold starting 11/15/2020 per [name] office visit note  Spironolactone 25 mg take 1 tablet daily  Labs: 11/10/2020 Creatinine 0.95, BUN 14, Potassium 2.9, Sodium 141, GFR >60 10/26/2020 Creatinine1.28, BUN45, Potassium4.4, [id number], GYF74 10/25/2020 Creatinine1.07, BUN31, Potassium4.0, [id number], GFR59  10/24/2020 Creatinine1.02, BUN35, Potassium4.2, [id number], GFR>60  10/23/2020 Creatinine1.16, BUN41, Potassium4.2, [id number], GFR54  10/22/2020 Creatinine1.40, BUN34, Potassium4.8, Sodium137, GFR>60  10/21/2020 Creatinine1.03, BUN17, Potassium4.9, Sodium137, GFR>60 A complete set of results can be found in Results Review.  Recommendations:Advised to call Dr Bensimhon's office today to reschedule her missed OV.  Advised when there are changes in condition to call either office or ICM number.    Follow-up plan: ICM clinic phone appointment on3/06/2021. 91 day device clinic remote transmission4/26/2022.  EP/Cardiology  Office Visits: Rescheduling Advance HF clinic OV.  Copy of ICM check sent to Dr.Allred.  3 month ICM trend: 01/17/2021.    1 Year ICM trend:       [name], RN 01/17/2021 11:45 AM

## 2021-01-24 ENCOUNTER — Inpatient Hospital Stay: Payer: Medicare Other

## 2021-01-24 ENCOUNTER — Ambulatory Visit (HOSPITAL_COMMUNITY): Payer: BC Managed Care – PPO

## 2021-01-26 ENCOUNTER — Inpatient Hospital Stay: Payer: Medicare Other | Admitting: Internal Medicine

## 2021-01-27 ENCOUNTER — Other Ambulatory Visit: Payer: Self-pay

## 2021-01-27 ENCOUNTER — Ambulatory Visit (HOSPITAL_COMMUNITY)
Admission: RE | Admit: 2021-01-27 | Discharge: 2021-01-27 | Disposition: A | Discharge status: Home / Self Care | Payer: BC Managed Care – PPO | Source: Ambulatory Visit | Attending: Cardiology | Admitting: Cardiology

## 2021-01-27 ENCOUNTER — Encounter (HOSPITAL_COMMUNITY): Payer: Self-pay

## 2021-01-27 VITALS — BP 117/76 | HR 67 | Wt 104.2 lb

## 2021-01-27 DIAGNOSIS — I5022 Chronic systolic (congestive) heart failure: Secondary | ICD-10-CM | POA: Diagnosis not present

## 2021-01-27 DIAGNOSIS — I110 Hypertensive heart disease with heart failure: Secondary | ICD-10-CM | POA: Diagnosis not present

## 2021-01-27 DIAGNOSIS — J9611 Chronic respiratory failure with hypoxia: Secondary | ICD-10-CM | POA: Diagnosis not present

## 2021-01-27 DIAGNOSIS — Z7982 Long term (current) use of aspirin: Secondary | ICD-10-CM | POA: Insufficient documentation

## 2021-01-27 DIAGNOSIS — Z79899 Other long term (current) drug therapy: Secondary | ICD-10-CM | POA: Insufficient documentation

## 2021-01-27 DIAGNOSIS — Z7901 Long term (current) use of anticoagulants: Secondary | ICD-10-CM | POA: Diagnosis not present

## 2021-01-27 DIAGNOSIS — J90 Pleural effusion, not elsewhere classified: Secondary | ICD-10-CM | POA: Insufficient documentation

## 2021-01-27 DIAGNOSIS — I252 Old myocardial infarction: Secondary | ICD-10-CM | POA: Diagnosis not present

## 2021-01-27 DIAGNOSIS — Z951 Presence of aortocoronary bypass graft: Secondary | ICD-10-CM | POA: Insufficient documentation

## 2021-01-27 DIAGNOSIS — Z7984 Long term (current) use of oral hypoglycemic drugs: Secondary | ICD-10-CM | POA: Diagnosis not present

## 2021-01-27 DIAGNOSIS — I2510 Atherosclerotic heart disease of native coronary artery without angina pectoris: Secondary | ICD-10-CM | POA: Insufficient documentation

## 2021-01-27 DIAGNOSIS — I480 Paroxysmal atrial fibrillation: Secondary | ICD-10-CM | POA: Insufficient documentation

## 2021-01-27 DIAGNOSIS — Z87891 Personal history of nicotine dependence: Secondary | ICD-10-CM | POA: Diagnosis not present

## 2021-01-27 DIAGNOSIS — J441 Chronic obstructive pulmonary disease with (acute) exacerbation: Secondary | ICD-10-CM | POA: Insufficient documentation

## 2021-01-27 DIAGNOSIS — Z8249 Family history of ischemic heart disease and other diseases of the circulatory system: Secondary | ICD-10-CM | POA: Diagnosis not present

## 2021-01-27 LAB — BASIC METABOLIC PANEL
Anion gap: 11 (ref 5–15)
BUN: 13 mg/dL (ref 6–20)
CO2: 28 mmol/L (ref 22–32)
Calcium: 9.9 mg/dL (ref 8.9–10.3)
Chloride: 100 mmol/L (ref 98–111)
Creatinine, Ser: 0.95 mg/dL (ref 0.44–1.00)
GFR, Estimated: >60 mL/min (ref >60)
Glucose, Bld: 124 mg/dL — ABNORMAL HIGH (ref 70–99)
Potassium: 3 mmol/L — ABNORMAL LOW (ref 3.5–5.1)
Sodium: 139 mmol/L (ref 135–145)

## 2021-01-27 MED ORDER — TORSEMIDE 20 MG PO TABS
60.0000 mg | ORAL_TABLET | Freq: Every day | ORAL | 3 refills | Status: DC
Start: 2021-01-27 — End: 2021-04-11

## 2021-01-27 NOTE — Patient Instructions (Signed)
Take 2.5 mg of metolazone with torsemide on 01/28/21 Be sure to take an ADDITIONAL 40 meq of potassium RESTART Torsemide 60 mg daily   Labs today We will only contact you if something comes back abnormal or we need to make some changes. Otherwise no news is good news!  Labs needed in 7-10 days  Do the following things EVERYDAY: 1) Weigh yourself in the morning before breakfast. Write it down and keep it in a log. 2) Take your medicines as prescribed 3) Eat low salt foods--Limit salt (sodium) to 2000 mg per day.  4) Stay as active as you can everyday 5) Limit all fluids for the day to less than 2 liters  At the [facility], you and your health needs are our priority. As part of our continuing mission to provide you with exceptional heart care, we have created designated Provider Care Teams. These Care Teams include your primary Cardiologist (physician) and Advanced Practice Providers (APPs- Physician Assistants and Nurse Practitioners) who all work together to provide you with the care you need, when you need it.   You may see any of the following providers on your designated Care Team at your next follow up: Marland Kitchen Dr Glori Bickers . Dr Loralie Champagne . Dr Vickki Muff . Darrick Grinder, NP . Lyda Jester, [place] . Audry Riles, PharmD   Please be sure to bring in all your medications bottles to every appointment.

## 2021-01-27 NOTE — Progress Notes (Signed)
Advanced HF Clinic Note   ID:  Brittany [name], Brittany [name] November 24, 1961, MRN [id number]  Location: Home  Provider location: [place] Type of Visit: Established patient   PCP:  Scifres, [name], PA-C  Cardiologist:  No primary care provider on file. 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 Underwent emergent cath 02/03/18 showed LAD 100% stenosed, LCx 95% stenosed. Taken for emergent CABG 02/03/18. Required impella post op. Hospital course complicated by cardiogenic shock, HCAP, A fib, respiratory failure, and swallowing issues. She was discharged to SNF. Discharge weight 103 pounds.   In 2019 had multiple hospitalizations for HF and pleural effusion. Underwent pleurodesis at [facility].  Admitted 3/20 with NSTEMI and HF. Received DES to ostial ramus into distal left main based on cath below and underwent diuresis. Meds adjusted as tolerated. Echo with EF 25-30%.   She had a ReDS check on 5/8 with reading of 35%, but weight was up 2 lbs and she was SOB.  Admitted 11/21 with COPD flare with mild HF and hemoptysis. CTA showed stable scarring in the right hilum consistent with emphysema.. Underwent bronchoscopy on 11/15, no obvious source of bleeding found on examination.   Echo 11/21 EF 20-25%. RV mildly HK.   Had post hospital f/u on 12/1 and was fluid overloaded on device interrogation. She was instructed to take Metolazone 2.58m x 2 days + 40 mEq KCl and torsemide was increased from 40>>60 mg daily.   Had return f/u again on 12/6 and was felt to be dry/ over diuresed. Optivol fluid index was way down. Labs were  c/w AKI. SCr had bumped from 0.95>>1.82. BUN 14>>38. She was instructed to stop metolazone and hold torsemide x 2 days, then resume at alternating dosing 60 mg one day and 40 mg the next.  She returns back to clinic today for f/u. She was scheduled for return f/u visit before now but cancel the appt. Now returns back for f/u/ and fluid overloaded based on optivol. Crossed fluid index threshold ~ 1/10. Impedence down. No AF. No VT/ VF. She has noticed increased abdominal fullness and chest pressure/ exertional dyspnea. Has had to sleep w/ oxygen recently at night. Reports full med compliance but noticed decreased UOP last week. Drinking lots of water during the day, she reports likely more that 2L. Stays extremely thirsty.     ICD today: Optivol c/w fluid overload. Impedence down. Crossed fluid index threshold ~1/10 and still elevated. No VT/AF.  Personally reviewed  Echo 02/12/19: EF 25-30%, RV mildly reduced  LHC 02/12/19:  Ost LAD to Prox LAD lesion is 100% stenosed.  Ost Cx to Prox Cx lesion is 100% stenosed.  Ost Ramus lesion is 99% stenosed.  Post intervention, there is a 0% residual stenosis.  A drug-eluting stent was successfully placed using a [id number].  SVG and is normal in caliber.  The graft exhibits no disease.  Ost 1st Diag lesion is 70% stenosed.  SVG and is normal in caliber.  Prox Graft lesion is 100% stenosed. 1. Significant underlying two-vessel coronary artery disease with patent SVG to OM and SVG to diagonal which supplies the LAD territory.  Atretic LIMA to LAD given competitive flow from SVG to diagonal. Severe ostial ramus artery stenosis not supplied by bypass with his area suggestive of plaque rupture. 2. Severely elevated left ventricular end-diastolic pressure 34 mmHg. Left ventricular angiography was not performed. 3. Successful angioplasty and drug-eluting stent placement to the ostial ramus extending into the distal left  main.  Past Medical History:  Diagnosis Date  . Acute systolic congestive heart failure ([place]) 02/03/2018  . Allergy   . Anxiety   . Asthma   . DM2 (diabetes mellitus, type 2) ([place]) 10/20/2020  . Hypertension   . PAF (paroxysmal atrial fibrillation) ([place])   . Prophylactic measure 08/03/14-08/19/14   Prophyl. cranial radiation 24 Gy  . 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 Small cell lung cancer ([place]) 03/16/2014   Past Surgical History:  Procedure Laterality Date  . BRONCHIAL BRUSHINGS  10/25/2020   Procedure: BRONCHIAL BRUSHINGS;  Surgeon: Collene Gobble, MD;  Location: [facility] ENDOSCOPY;  Service: Pulmonary;;  . BRONCHIAL NEEDLE ASPIRATION BIOPSY  10/25/2020   Procedure: BRONCHIAL NEEDLE ASPIRATION BIOPSIES;  Surgeon: Collene Gobble, MD;  Location: [facility] ENDOSCOPY;  Service: Pulmonary;;  . CARDIAC DEFIBRILLATOR PLACEMENT  08/15/2018   MDT Visia AF MRI VR ICD implanted by Dr Loel Lofty for primary prevention of sudden  . CESAREAN SECTION    . CORONARY ARTERY BYPASS GRAFT N/A 02/03/2018   Procedure: CORONARY ARTERY BYPASS GRAFTING (CABG);  Surgeon: Rexene Alberts, MD;  Location: [place];  Service: Open Heart Surgery;  Laterality: N/A;  Time 3 using left internal mammary artery and endoscopically harvested right saphenous vein  . CORONARY BALLOON ANGIOPLASTY N/A 02/03/2018   Procedure: CORONARY BALLOON ANGIOPLASTY;  Surgeon: Martinique, Peter M, MD;  Location: [place] CV LAB;  Service: Cardiovascular;  Laterality: N/A;  . CORONARY STENT INTERVENTION N/A 02/12/2019   Procedure: CORONARY STENT INTERVENTION;  Surgeon: Wellington Hampshire, MD;  Location: [place] CV LAB;  Service: Cardiovascular;  Laterality: N/A;  . CORONARY/GRAFT ACUTE MI REVASCULARIZATION N/A 02/03/2018   Procedure: Coronary/Graft Acute MI Revascularization;  Surgeon: Martinique, Peter M, MD;  Location: [place] CV LAB;   Service: Cardiovascular;  Laterality: N/A;  . ENDOBRONCHIAL ULTRASOUND N/A 10/25/2020   Procedure: ENDOBRONCHIAL ULTRASOUND;  Surgeon: Collene Gobble, MD;  Location: [facility] ENDOSCOPY;  Service: Pulmonary;  Laterality: N/A;  . FLEXIBLE BRONCHOSCOPY  10/25/2020   Procedure: FLEXIBLE BRONCHOSCOPY;  Surgeon: Collene Gobble, MD;  Location: [facility] ENDOSCOPY;  Service: Pulmonary;;  . IABP INSERTION N/A 02/03/2018   Procedure: IABP Insertion;  Surgeon: Martinique, Peter M, MD;  Location: [place] CV LAB;  Service: Cardiovascular;  Laterality: N/A;  . INTRAOPERATIVE TRANSESOPHAGEAL ECHOCARDIOGRAM N/A 02/03/2018   Procedure: INTRAOPERATIVE TRANSESOPHAGEAL ECHOCARDIOGRAM;  Surgeon: Rexene Alberts, MD;  Location: [place];  Service: Open Heart Surgery;  Laterality: N/A;  . LEFT HEART CATH AND CORONARY ANGIOGRAPHY N/A 02/03/2018   Procedure: LEFT HEART CATH AND CORONARY ANGIOGRAPHY;  Surgeon: Martinique, Peter M, MD;  Location: [place] CV LAB;  Service: Cardiovascular;  Laterality: N/A;  . LEFT HEART CATH AND CORS/GRAFTS ANGIOGRAPHY N/A 02/12/2019   Procedure: LEFT HEART CATH AND CORS/GRAFTS ANGIOGRAPHY;  Surgeon: Wellington Hampshire, MD;  Location: [place] CV LAB;  Service: Cardiovascular;  Laterality: N/A;  . MEDIASTINOSCOPY N/A 03/11/2014   Procedure: MEDIASTINOSCOPY;  Surgeon: Melrose Nakayama, MD;  Location: [facility]  OR;  Service: Thoracic;  Laterality: N/A;  . PLACEMENT OF [place] LEFT VENTRICULAR ASSIST DEVICE  02/03/2018   Procedure: PLACEMENT OF [place] LEFT VENTRICULAR ASSIST DEVICE LD;  Surgeon: Rexene Alberts, MD;  Location: [place];  Service: Open Heart Surgery;;  . REMOVAL OF IMPELLA LEFT VENTRICULAR ASSIST DEVICE N/A 02/08/2018   Procedure: REMOVAL OF [place] LEFT VENTRICULAR ASSIST DEVICE;  Surgeon: Rexene Alberts, MD;  Location: [place];  Service: Open Heart Surgery;  Laterality: N/A;  . RIGHT HEART CATH N/A 02/03/2018   Procedure: RIGHT HEART CATH;  Surgeon: Martinique, Peter M, MD;  Location: [place] CV LAB;   Service: Cardiovascular;  Laterality: N/A;  . RIGHT HEART CATH N/A 05/09/2018   Procedure: RIGHT HEART CATH;  Surgeon: Jolaine Artist, MD;  Location: [place] CV LAB;  Service: Cardiovascular;  Laterality: N/A;  . TEE WITHOUT CARDIOVERSION N/A 02/08/2018   Procedure: TRANSESOPHAGEAL ECHOCARDIOGRAM (TEE);  Surgeon: Rexene Alberts, MD;  Location: [place];  Service: Open Heart Surgery;  Laterality: N/A;  . TUBAL LIGATION    . VIDEO BRONCHOSCOPY WITH ENDOBRONCHIAL ULTRASOUND N/A 03/11/2014   Procedure: VIDEO BRONCHOSCOPY WITH ENDOBRONCHIAL ULTRASOUND;  Surgeon: Melrose Nakayama, MD;  Location: [place];  Service: Thoracic;  Laterality: N/A;     Current Outpatient Medications  Medication Sig Dispense Refill  . albuterol (VENTOLIN HFA) 108 (90 Base) MCG/ACT inhaler Inhale 2 puffs into the lungs every 6 (six) hours as needed for wheezing or shortness of breath. 1 Inhaler 0  . ALPRAZolam (XANAX) 0.25 MG tablet Take 1 tablet (0.25 mg total) by mouth daily as needed for anxiety. 10 tablet 0  . aspirin EC 81 MG tablet Take 1 tablet (81 mg total) by mouth daily. Swallow whole. 30 tablet 3  . atorvastatin (LIPITOR) 80 MG tablet Take 1 tablet (80 mg total) by mouth daily at 6 PM. 30 tablet 1  . dapagliflozin propanediol (FARXIGA) 10 MG TABS tablet Take 1 tablet (10 mg total) by mouth daily before breakfast. 30 tablet 6  . feeding supplement (ENSURE ENLIVE / ENSURE PLUS) LIQD Take 237 mLs by mouth 3 (three) times daily between meals. 237 mL 12  . ipratropium-albuterol (DUONEB) 0.5-2.5 (3) MG/3ML SOLN Take 3 mLs by nebulization 2 (two) times daily as needed.    . ivabradine (CORLANOR) 7.5 MG TABS tablet Take 1 tablet (7.5 mg total) by mouth 2 (two) times daily with a meal. 60 tablet 5  . metolazone (ZAROXOLYN) 2.5 MG tablet Take 1 tablet (2.5 mg total) by mouth as directed. Take today and tomorrow and then hold till directed 10 tablet 0  . nitroGLYCERIN (NITROSTAT) 0.4 MG SL tablet Place 1 tablet (0.4 mg  total) under the tongue every 5 (five) minutes as needed for chest pain. 90 tablet 5  . potassium chloride SA (KLOR-CON) 20 MEQ tablet Take 1 tablet (20 mEq total) by mouth daily. Take 42mq (2 tablets) with metolazone doses 90 tablet 3  . torsemide (DEMADEX) 20 MG tablet Take 3 tablets (60 mg total) by mouth every other day. 135 tablet 3  . torsemide (DEMADEX) 20 MG tablet Take 2 tablets (40 mg total) by mouth every other day. 90 tablet 3   No current facility-administered medications for this encounter.    Allergies:   Codeine   Social History:  The patient  reports that she quit smoking about 6 years ago. Her smoking use included cigarettes. She has a 20.00 pack-year smoking history. She has never used smokeless tobacco. She  reports current alcohol use of about 8.0 standard drinks of alcohol per week. She reports current drug use. Drug: Marijuana.   Family History:  The patient's family history includes Cancer in her maternal grandmother; Diabetes in her paternal grandmother; Heart attack in her mother; Heart disease in her mother; Hypertension in her maternal grandmother and mother.   ROS:  Please see the history of present illness.   All other systems are personally reviewed and negative.    Vitals:   01/27/21 1447  BP: 117/76  Pulse: 67  SpO2: 97%  Weight: 47.3 kg (104 lb 3.2 oz)   PHYSICAL EXAM: General:  Chronically ill appearing/ thin female, appears much older than actual age No respiratory difficulty HEENT: normal Neck: supple. JVD 10 cm. Carotids 2+ bilat; no bruits. No lymphadenopathy or thyromegaly appreciated. Cor: PMI nondisplaced. Regular rate & rhythm. No rubs, gallops or murmurs. Lungs: decreased BS at the bases bilaterally  Abdomen: soft, nontender, mildly distended/ edematous. No hepatosplenomegaly. No bruits or masses. Good bowel sounds. Extremities: thin extremities, no cyanosis, clubbing, rash, edema Neuro: alert & oriented x 3, cranial nerves grossly intact.  moves all 4 extremities w/o difficulty. Affect pleasant.   EKG: not performed today.   ICD today:  Optivol c/w fluid overload. Impedence down. Crossed fluid index threshold ~1/10 and still elevated. No VT/AF. Personally reviewed   Recent Labs: 10/20/2020: B Natriuretic Peptide 1,118.4 11/15/2020: ALT 18; BUN 38; Creatinine, Ser 1.82; Hemoglobin 16.4; Platelets 266; Potassium 2.8; Sodium 135  Personally reviewed   Wt Readings from Last 3 Encounters:  01/27/21 47.3 kg (104 lb 3.2 oz)  11/15/20 46.4 kg (102 lb 3.2 oz)  11/10/20 48.2 kg (106 lb 3.2 oz)     ASSESSMENT AND PLAN:  1.Acute on ChronicSystolic HF due to ICM. S/p Medtronic single chamber ICD. -Echo 09/04/18 ([facility]) LVEF 20%, Moderate AI, Mild MR, Mild TR, Severe LAE, RV mildly decreased.  - Echo 02/12/19: EF 25-30%, RV mildly reduced - Echo 11/21 EF 20-25% RV mild HK - NYHA III, chronic  - Optivol c/w fluid overload. Impedence down. Crossed fluid index threshold ~1/10 and still elevated. No VT/AF. - Take 2.5 mg of metolazone x 1 w/ next dose of torsemide + extra 40 mEq of KCl - Increase torsemide to 60 mg daily  - Continue spiro 25 mg qHS.  - Continue corlanor 7.5 bid - consider low dose Losartan/ Entersto pending renal function (recent AKI) - No b-blocker yet due to intolerance with low output - has ICD. No VT/VF  - Check BMP today and again in 7 days  - She is drinking lots of water. Recommend fluid restriction to < 2L/ day   2. CAD  - Hx of NSTEMI/STEMI s/p Emergent CABG 02/03/18: Cath with severe 2v CAD as above with severe LV dysfunction/ICM. s/p Emergent CABG 02/03/18. - Had NSTEMI and now s/p LHC 02/12/19 with DES to the ostial ramus extending into the distal left main - ASA + Brilinta stopped at recent discharge due to bleeding - back on  ASA - stable w/o CP   3. Recurrent pleural effusions s/p pleurodesis - Trace pleural effusion on right on CXR on recent admit.  - lungs clear on exam today.  - O2 sats 97%  on RA   4. Chronic hypoxic respiratory failure - multifactorial; lung exam clear today.  - Wears O2 PRN at home. O2 sats 97% on RA today  - increase diuretics for a/c CHF per aobe  - she is followed by pulmonology  5. PAF - CHA2DS2/VASc is at least 4. (CHF, Vasc disease, HTN, Female).  - She has history of Afib RVR in the past in the setting of her Lung CA. Previously on Xarelto but stopped.  - cont ASA - No afib byICD interrogation today   6. 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  - Chest CT 02/19/18 with mass-like consolidation in R hilum concerning for recurrent tumor.  - Repeat CT 03/27/2018 -No definite findings of locally recurrent tumor in the right lung. 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 Continued chest CT surveillance is advised in 3-6 months. - S/p thoracentesis 04/2018. Cytology negative for malignancy.  - CTA  11/19 stable scarring in the right hilum consistent with emphysema. Bronchoscopy on 11/15, no obvious source of bleeding  F/u in 2 weeks to reassess volume status.   Signed, Lyda Jester, PA-C  01/27/2021 2:56 PM

## 2021-01-31 ENCOUNTER — Telehealth (HOSPITAL_COMMUNITY): Payer: Self-pay | Admitting: Cardiology

## 2021-01-31 MED ORDER — POTASSIUM CHLORIDE CRYS ER 20 MEQ PO TBCR
40.0000 meq | EXTENDED_RELEASE_TABLET | Freq: Every day | ORAL | 3 refills | Status: DC
Start: 2021-01-31 — End: 2021-05-11

## 2021-01-31 NOTE — Telephone Encounter (Signed)
Pt aware and voiced understanding Repeat labs 2/24  Pt reports she is NOT on spiro was told to d/c by MD in [facility]

## 2021-01-31 NOTE — Telephone Encounter (Signed)
-----   Message from Consuelo Pandy, [place] sent at 01/27/2021  4:50 PM EST ----- K low Instruct to take 40 mEq of KCl tonight.  She should increase daily regimen to 40 mEq daily   See if she is still taking spiro? No longer on list

## 2021-02-03 ENCOUNTER — Other Ambulatory Visit (HOSPITAL_COMMUNITY): Payer: Medicare Other

## 2021-02-09 ENCOUNTER — Telehealth (HOSPITAL_COMMUNITY): Payer: Self-pay | Admitting: *Deleted

## 2021-02-09 ENCOUNTER — Inpatient Hospital Stay (HOSPITAL_COMMUNITY)
Admission: AD | Admit: 2021-02-09 | Discharge: 2021-02-12 | DRG: 191 | Disposition: A | Discharge status: Home / Self Care | Payer: BC Managed Care – PPO | Source: Ambulatory Visit | Attending: Internal Medicine | Admitting: Internal Medicine

## 2021-02-09 ENCOUNTER — Other Ambulatory Visit: Payer: Self-pay

## 2021-02-09 ENCOUNTER — Ambulatory Visit (HOSPITAL_BASED_OUTPATIENT_CLINIC_OR_DEPARTMENT_OTHER)
Admission: RE | Admit: 2021-02-09 | Discharge: 2021-02-09 | Disposition: A | Discharge status: Home / Self Care | Payer: BC Managed Care – PPO | Source: Ambulatory Visit | Attending: Family Medicine | Admitting: Family Medicine

## 2021-02-09 VITALS — BP 111/72 | HR 94 | Wt 105.2 lb

## 2021-02-09 DIAGNOSIS — I252 Old myocardial infarction: Secondary | ICD-10-CM | POA: Insufficient documentation

## 2021-02-09 DIAGNOSIS — I358 Other nonrheumatic aortic valve disorders: Secondary | ICD-10-CM | POA: Diagnosis present

## 2021-02-09 DIAGNOSIS — Z95810 Presence of automatic (implantable) cardiac defibrillator: Secondary | ICD-10-CM | POA: Diagnosis present

## 2021-02-09 DIAGNOSIS — R0600 Dyspnea, unspecified: Secondary | ICD-10-CM

## 2021-02-09 DIAGNOSIS — I110 Hypertensive heart disease with heart failure: Secondary | ICD-10-CM | POA: Diagnosis not present

## 2021-02-09 DIAGNOSIS — Z79899 Other long term (current) drug therapy: Secondary | ICD-10-CM

## 2021-02-09 DIAGNOSIS — F419 Anxiety disorder, unspecified: Secondary | ICD-10-CM | POA: Diagnosis present

## 2021-02-09 DIAGNOSIS — J90 Pleural effusion, not elsewhere classified: Secondary | ICD-10-CM | POA: Insufficient documentation

## 2021-02-09 DIAGNOSIS — E119 Type 2 diabetes mellitus without complications: Secondary | ICD-10-CM | POA: Diagnosis present

## 2021-02-09 DIAGNOSIS — I255 Ischemic cardiomyopathy: Secondary | ICD-10-CM | POA: Diagnosis present

## 2021-02-09 DIAGNOSIS — Z87891 Personal history of nicotine dependence: Secondary | ICD-10-CM

## 2021-02-09 DIAGNOSIS — Z7982 Long term (current) use of aspirin: Secondary | ICD-10-CM | POA: Insufficient documentation

## 2021-02-09 DIAGNOSIS — J9611 Chronic respiratory failure with hypoxia: Secondary | ICD-10-CM | POA: Diagnosis present

## 2021-02-09 DIAGNOSIS — Z885 Allergy status to narcotic agent status: Secondary | ICD-10-CM | POA: Insufficient documentation

## 2021-02-09 DIAGNOSIS — T502X5A Adverse effect of carbonic-anhydrase inhibitors, benzothiadiazides and other diuretics, initial encounter: Secondary | ICD-10-CM | POA: Diagnosis present

## 2021-02-09 DIAGNOSIS — Z923 Personal history of irradiation: Secondary | ICD-10-CM

## 2021-02-09 DIAGNOSIS — Z20822 Contact with and (suspected) exposure to covid-19: Secondary | ICD-10-CM | POA: Diagnosis present

## 2021-02-09 DIAGNOSIS — Z951 Presence of aortocoronary bypass graft: Secondary | ICD-10-CM | POA: Diagnosis not present

## 2021-02-09 DIAGNOSIS — I5022 Chronic systolic (congestive) heart failure: Secondary | ICD-10-CM

## 2021-02-09 DIAGNOSIS — I5042 Chronic combined systolic (congestive) and diastolic (congestive) heart failure: Secondary | ICD-10-CM | POA: Diagnosis present

## 2021-02-09 DIAGNOSIS — Z8249 Family history of ischemic heart disease and other diseases of the circulatory system: Secondary | ICD-10-CM

## 2021-02-09 DIAGNOSIS — R079 Chest pain, unspecified: Secondary | ICD-10-CM

## 2021-02-09 DIAGNOSIS — I771 Stricture of artery: Secondary | ICD-10-CM | POA: Diagnosis present

## 2021-02-09 DIAGNOSIS — Z7901 Long term (current) use of anticoagulants: Secondary | ICD-10-CM | POA: Insufficient documentation

## 2021-02-09 DIAGNOSIS — I200 Unstable angina: Secondary | ICD-10-CM

## 2021-02-09 DIAGNOSIS — I5043 Acute on chronic combined systolic (congestive) and diastolic (congestive) heart failure: Secondary | ICD-10-CM

## 2021-02-09 DIAGNOSIS — Z85118 Personal history of other malignant neoplasm of bronchus and lung: Secondary | ICD-10-CM

## 2021-02-09 DIAGNOSIS — I5023 Acute on chronic systolic (congestive) heart failure: Secondary | ICD-10-CM | POA: Diagnosis present

## 2021-02-09 DIAGNOSIS — Z7984 Long term (current) use of oral hypoglycemic drugs: Secondary | ICD-10-CM | POA: Insufficient documentation

## 2021-02-09 DIAGNOSIS — E876 Hypokalemia: Secondary | ICD-10-CM | POA: Diagnosis present

## 2021-02-09 DIAGNOSIS — Z9981 Dependence on supplemental oxygen: Secondary | ICD-10-CM

## 2021-02-09 DIAGNOSIS — Z955 Presence of coronary angioplasty implant and graft: Secondary | ICD-10-CM | POA: Insufficient documentation

## 2021-02-09 DIAGNOSIS — R110 Nausea: Secondary | ICD-10-CM | POA: Insufficient documentation

## 2021-02-09 DIAGNOSIS — R0789 Other chest pain: Secondary | ICD-10-CM | POA: Diagnosis present

## 2021-02-09 DIAGNOSIS — R0602 Shortness of breath: Secondary | ICD-10-CM

## 2021-02-09 DIAGNOSIS — I25110 Atherosclerotic heart disease of native coronary artery with unstable angina pectoris: Secondary | ICD-10-CM | POA: Insufficient documentation

## 2021-02-09 DIAGNOSIS — N179 Acute kidney failure, unspecified: Secondary | ICD-10-CM | POA: Diagnosis present

## 2021-02-09 DIAGNOSIS — I2582 Chronic total occlusion of coronary artery: Secondary | ICD-10-CM | POA: Diagnosis present

## 2021-02-09 DIAGNOSIS — E785 Hyperlipidemia, unspecified: Secondary | ICD-10-CM | POA: Diagnosis present

## 2021-02-09 DIAGNOSIS — R0609 Other forms of dyspnea: Secondary | ICD-10-CM

## 2021-02-09 DIAGNOSIS — J441 Chronic obstructive pulmonary disease with (acute) exacerbation: Secondary | ICD-10-CM | POA: Diagnosis present

## 2021-02-09 DIAGNOSIS — I2510 Atherosclerotic heart disease of native coronary artery without angina pectoris: Secondary | ICD-10-CM

## 2021-02-09 DIAGNOSIS — I351 Nonrheumatic aortic (valve) insufficiency: Secondary | ICD-10-CM | POA: Diagnosis present

## 2021-02-09 DIAGNOSIS — I480 Paroxysmal atrial fibrillation: Secondary | ICD-10-CM | POA: Diagnosis not present

## 2021-02-09 DIAGNOSIS — Z833 Family history of diabetes mellitus: Secondary | ICD-10-CM

## 2021-02-09 LAB — COMPREHENSIVE METABOLIC PANEL
ALT: 16 U/L (ref 0–44)
AST: 25 U/L (ref 15–41)
Albumin: 4.3 g/dL (ref 3.5–5.0)
Alkaline Phosphatase: 94 U/L (ref 38–126)
Anion gap: 13 (ref 5–15)
BUN: 11 mg/dL (ref 6–20)
CO2: 28 mmol/L (ref 22–32)
Calcium: 10.7 mg/dL — ABNORMAL HIGH (ref 8.9–10.3)
Chloride: 98 mmol/L (ref 98–111)
Creatinine, Ser: 0.92 mg/dL (ref 0.44–1.00)
GFR, Estimated: >60 mL/min (ref >60)
Glucose, Bld: 127 mg/dL — ABNORMAL HIGH (ref 70–99)
Potassium: 3.3 mmol/L — ABNORMAL LOW (ref 3.5–5.1)
Sodium: 139 mmol/L (ref 135–145)
Total Bilirubin: 1.1 mg/dL (ref 0.3–1.2)
Total Protein: 7.7 g/dL (ref 6.5–8.1)

## 2021-02-09 LAB — CBC
HCT: 52.9 % — ABNORMAL HIGH (ref 36.0–46.0)
Hemoglobin: 17.1 g/dL — ABNORMAL HIGH (ref 12.0–15.0)
MCH: 30.3 pg (ref 26.0–34.0)
MCHC: 32.3 g/dL (ref 30.0–36.0)
MCV: 93.6 fL (ref 80.0–100.0)
Platelets: 204 10*3/uL (ref 150–400)
RBC: 5.65 MIL/uL — ABNORMAL HIGH (ref 3.87–5.11)
RDW: 19.4 % — ABNORMAL HIGH (ref 11.5–15.5)
WBC: 6.2 10*3/uL (ref 4.0–10.5)
nRBC: 0 % (ref 0.0–0.2)

## 2021-02-09 LAB — PROCALCITONIN: Procalcitonin: <0.1 ng/mL

## 2021-02-09 LAB — HIV ANTIBODY (ROUTINE TESTING W REFLEX): HIV Screen 4th Generation wRfx: NONREACTIVE

## 2021-02-09 LAB — TROPONIN I (HIGH SENSITIVITY): Troponin I (High Sensitivity): 21 ng/L — ABNORMAL HIGH (ref <18)

## 2021-02-09 LAB — TSH: TSH: 0.765 u[IU]/mL (ref 0.350–4.500)

## 2021-02-09 MED ORDER — DAPAGLIFLOZIN PROPANEDIOL 10 MG PO TABS
10.0000 mg | ORAL_TABLET | Freq: Every day | ORAL | Status: DC
  Administered 2021-02-10 – 2021-02-11 (×2): 10 mg via ORAL
  Filled 2021-02-09 (×2): qty 1

## 2021-02-09 MED ORDER — ALPRAZOLAM 0.25 MG PO TABS
0.2500 mg | ORAL_TABLET | Freq: Every day | ORAL | Status: DC | PRN
  Administered 2021-02-09 – 2021-02-10 (×2): 0.25 mg via ORAL
  Filled 2021-02-09 (×2): qty 1

## 2021-02-09 MED ORDER — ACETAMINOPHEN 325 MG PO TABS: 650.0000 mg | ORAL_TABLET | ORAL | Status: DC | PRN

## 2021-02-09 MED ORDER — ASPIRIN EC 81 MG PO TBEC
81.0000 mg | DELAYED_RELEASE_TABLET | Freq: Every day | ORAL | Status: DC
Start: 2021-02-10 — End: 2021-02-12
  Administered 2021-02-10 – 2021-02-12 (×2): 81 mg via ORAL
  Filled 2021-02-09 (×3): qty 1

## 2021-02-09 MED ORDER — ASPIRIN 81 MG PO CHEW
324.0000 mg | CHEWABLE_TABLET | ORAL | Status: AC
Start: 2021-02-09 — End: 2021-02-09
  Administered 2021-02-09: 324 mg via ORAL
  Filled 2021-02-09: qty 4

## 2021-02-09 MED ORDER — ENOXAPARIN SODIUM 60 MG/0.6ML ~~LOC~~ SOLN
50.0000 mg | Freq: Two times a day (BID) | SUBCUTANEOUS | Status: DC
  Administered 2021-02-09 – 2021-02-11 (×4): 50 mg via SUBCUTANEOUS
  Filled 2021-02-09 (×4): qty 0.6

## 2021-02-09 MED ORDER — IPRATROPIUM-ALBUTEROL 0.5-2.5 (3) MG/3ML IN SOLN
3.0000 mL | Freq: Two times a day (BID) | RESPIRATORY_TRACT | Status: DC | PRN
  Filled 2021-02-09: qty 3

## 2021-02-09 MED ORDER — ENOXAPARIN SODIUM 60 MG/0.6ML ~~LOC~~ SOLN: 50.0000 mg | Freq: Once | SUBCUTANEOUS | Status: DC

## 2021-02-09 MED ORDER — NITROGLYCERIN 0.4 MG SL SUBL: 0.4000 mg | SUBLINGUAL_TABLET | SUBLINGUAL | Status: DC | PRN

## 2021-02-09 MED ORDER — TORSEMIDE 20 MG PO TABS
60.0000 mg | ORAL_TABLET | Freq: Every day | ORAL | Status: DC
  Administered 2021-02-09 – 2021-02-10 (×2): 60 mg via ORAL
  Filled 2021-02-09 (×2): qty 3

## 2021-02-09 MED ORDER — ASPIRIN 300 MG RE SUPP
300.0000 mg | RECTAL | Status: AC
  Filled 2021-02-09: qty 1

## 2021-02-09 MED ORDER — ONDANSETRON HCL 4 MG/2ML IJ SOLN: 4.0000 mg | Freq: Four times a day (QID) | INTRAMUSCULAR | Status: DC | PRN

## 2021-02-09 MED ORDER — POTASSIUM CHLORIDE CRYS ER 20 MEQ PO TBCR
40.0000 meq | EXTENDED_RELEASE_TABLET | Freq: Once | ORAL | Status: AC
  Administered 2021-02-09: 40 meq via ORAL
  Filled 2021-02-09: qty 2

## 2021-02-09 MED ORDER — IVABRADINE HCL 7.5 MG PO TABS
7.5000 mg | ORAL_TABLET | Freq: Two times a day (BID) | ORAL | Status: DC
  Administered 2021-02-10 – 2021-02-12 (×5): 7.5 mg via ORAL
  Filled 2021-02-09 (×7): qty 1

## 2021-02-09 MED ORDER — ATORVASTATIN CALCIUM 80 MG PO TABS
80.0000 mg | ORAL_TABLET | Freq: Every day | ORAL | Status: DC
  Administered 2021-02-09 – 2021-02-11 (×3): 80 mg via ORAL
  Filled 2021-02-09 (×3): qty 1

## 2021-02-09 MED ORDER — ALBUTEROL SULFATE (2.5 MG/3ML) 0.083% IN NEBU: 2.5000 mg | INHALATION_SOLUTION | Freq: Four times a day (QID) | RESPIRATORY_TRACT | Status: DC | PRN

## 2021-02-09 MED ORDER — ASPIRIN EC 81 MG PO TBEC: 81.0000 mg | DELAYED_RELEASE_TABLET | Freq: Every day | ORAL | Status: DC

## 2021-02-09 NOTE — Addendum Note (Signed)
Encounter addended by: Malena Edman, RN on: 02/09/2021 4:00 PM  Actions taken: Vitals modified

## 2021-02-09 NOTE — Addendum Note (Signed)
Encounter addended by: Malena Edman, RN on: 02/09/2021 3:46 PM  Actions taken: Vitals modified

## 2021-02-09 NOTE — Progress Notes (Signed)
[place] for Lovenox Indication: chest pain/ACS  Labs: Recent Labs    02/09/21 1557  HGB 17.1*  HCT 52.9*  PLT 204  CREATININE 0.92    Assessment: 81 yof with hx of afib previously on Xarelto (not currently on anticoagulation PTA) presenting with CP. Pharmacy consulted to start Lovenox for ACS. Noted, Brilinta recently stopped due to bleeding issues. CBC wnl, SCr 0.92 (at recent baseline). No current active bleed issues reported.  Goal of Therapy:  Anti-Xa level 0.6-1 units/ml 4hrs after LMWH dose given Monitor platelets by anticoagulation protocol: Yes   Plan:  Lovenox 50mg  (1mg /kg) [place] q12h Monitor CBC at least q72h, SCr, s/sx bleeding   Arturo Morton, PharmD, BCPS Clinical Pharmacist 02/09/2021 5:32 PM

## 2021-02-09 NOTE — H&P (Addendum)
This note reflects work completed today  Advanced Heart Failure H&P    PCP:  Scifres, [name], PA-C  Cardiologist:  No primary care provider on file. 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 Underwent emergent cath 02/03/18 showed LAD 100% stenosed, LCx 95% stenosed. Taken for emergent CABG 02/03/18. Required impella post op. Hospital course complicated by cardiogenic shock, HCAP, A fib, respiratory failure, and swallowing issues. She was discharged to SNF. Discharge weight 103 pounds.   In 2019 had multiple hospitalizations for HF and pleural effusion. Underwent pleurodesis at [facility].  Admitted 3/20 with NSTEMI and HF. Received DES to ostial ramus into distal left main based on cath below and underwent diuresis. Meds adjusted as tolerated. Echo with EF 25-30%.   Admitted 11/21 with COPD flare with mild HF and hemoptysis. CTA showed stable scarring in the right hilum consistent with emphysema.. Underwent bronchoscopy on 11/15, no obvious source of bleeding found on examination.   Echo 11/21 EF 20-25%. RV mildly HK.   Had post hospital f/u on 12/1 and was fluid overloaded on device interrogation. She was instructed to take Metolazone 2.62m x 2 days + 40 mEq KCl and torsemide was increased from 40>>60 mg daily.   Had return f/u again on 12/6 and was felt to be dry/ over diuresed. Optivol fluid index was way down. Labs were c/w AKI. SCr had bumped from 0.95>>1.82. BUN 14>>38. She was instructed to stop metolazone and hold torsemide x 2 days, then resume at alternating dosing 60 mg one day and 40 mg the next.  Today she returned to  HF clinic for follow up. Last visit  3 weeks ago she was volume overloaded, likely due to fluid indescretion. Torsemide was increased and she was instructed to take an extra dose of metolazone. Now, she has had 4-5 days of chest heaviness with "massive heartburn" with pain going down arm, SOB increasing and feels weak. Chest heaviness happens randomly regardless of activity, lasts ~5-10 minutes. Does get SOB and nauseated. Has not taken nitro as her Rx is expired, heartburn unrelieved by tums. Denies increasing dizziness, edema.  +PND/Orthopnea. Appetite ok. No fever or chills, productive cough. Taking all medications. Uses home oxygen at night prn.  Cardidac Studies: Echo 02/12/19: EF 25-30%, RV mildly reduced  LHC 02/12/19:  Ost LAD to Prox LAD lesion is 100% stenosed.  Ost Cx to Prox Cx lesion is 100% stenosed.  Ost Ramus lesion is 99% stenosed.  Post intervention, there is a 0% residual stenosis.  A drug-eluting stent was successfully placed using a [id number].  SVG and is normal in caliber.  The graft exhibits no disease.  Ost 1st Diag lesion is 70% stenosed.  SVG and is normal in caliber.  Prox Graft lesion is 100% stenosed. 1. Significant underlying two-vessel coronary artery disease with patent SVG to OM and SVG to diagonal which supplies the LAD territory. Atretic LIMA to LAD given competitive flow from SVG to diagonal. Severe ostial ramus artery stenosis not supplied by bypass with his area suggestive of plaque rupture. 2. Severely elevated left ventricular end-diastolic pressure 34 mmHg. Left ventricular angiography was not performed. 3. Successful angioplasty and drug-eluting stent placement  to the ostial ramus extending into the distal left main.  Past Medical History:  Diagnosis Date  . Acute systolic congestive heart failure ([place]) 02/03/2018  . Allergy   . Anxiety   . Asthma   . DM2 (diabetes mellitus, type 2) ([place]) 10/20/2020  . Hypertension   . PAF (paroxysmal atrial  fibrillation) ([place])   . Prophylactic measure 08/03/14-08/19/14   Prophyl. cranial radiation 24 Gy  . 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 Small cell lung cancer ([place]) 03/16/2014   Past Surgical History:  Procedure Laterality Date  . BRONCHIAL BRUSHINGS  10/25/2020   Procedure: BRONCHIAL BRUSHINGS;  Surgeon: Collene Gobble, MD;  Location: [facility] ENDOSCOPY;  Service: Pulmonary;;  . BRONCHIAL NEEDLE ASPIRATION BIOPSY  10/25/2020   Procedure: BRONCHIAL NEEDLE ASPIRATION BIOPSIES;  Surgeon: Collene Gobble, MD;  Location: [facility] ENDOSCOPY;  Service: Pulmonary;;  . CARDIAC DEFIBRILLATOR PLACEMENT  08/15/2018   MDT Visia AF MRI VR ICD implanted by Dr Loel Lofty for primary prevention of sudden  . CESAREAN SECTION    . CORONARY ARTERY BYPASS GRAFT N/A 02/03/2018   Procedure: CORONARY ARTERY BYPASS GRAFTING (CABG);  Surgeon: Rexene Alberts, MD;  Location: [place];  Service: Open Heart Surgery;  Laterality: N/A;  Time 3 using left internal mammary artery and endoscopically harvested right saphenous vein  . CORONARY BALLOON ANGIOPLASTY N/A 02/03/2018   Procedure: CORONARY BALLOON ANGIOPLASTY;  Surgeon: Martinique, Peter M, MD;  Location: [place] CV LAB;  Service: Cardiovascular;  Laterality: N/A;  . CORONARY STENT INTERVENTION N/A 02/12/2019   Procedure: CORONARY STENT INTERVENTION;  Surgeon: Wellington Hampshire, MD;  Location: [place] CV LAB;  Service: Cardiovascular;  Laterality: N/A;  . CORONARY/GRAFT ACUTE MI REVASCULARIZATION N/A 02/03/2018   Procedure: Coronary/Graft Acute MI Revascularization;  Surgeon: Martinique, Peter M, MD;  Location: [place] CV LAB;  Service: Cardiovascular;  Laterality: N/A;  . ENDOBRONCHIAL ULTRASOUND N/A 10/25/2020   Procedure: ENDOBRONCHIAL ULTRASOUND;  Surgeon: Collene Gobble, MD;  Location: [facility] ENDOSCOPY;  Service: Pulmonary;  Laterality: N/A;  . FLEXIBLE  BRONCHOSCOPY  10/25/2020   Procedure: FLEXIBLE BRONCHOSCOPY;  Surgeon: Collene Gobble, MD;  Location: [facility] ENDOSCOPY;  Service: Pulmonary;;  . IABP INSERTION N/A 02/03/2018   Procedure: IABP Insertion;  Surgeon: Martinique, Peter M, MD;  Location: [place] CV LAB;  Service: Cardiovascular;  Laterality: N/A;  . INTRAOPERATIVE TRANSESOPHAGEAL ECHOCARDIOGRAM N/A 02/03/2018   Procedure: INTRAOPERATIVE TRANSESOPHAGEAL ECHOCARDIOGRAM;  Surgeon: Rexene Alberts, MD;  Location: [place];  Service: Open Heart Surgery;  Laterality: N/A;  . LEFT HEART CATH AND CORONARY ANGIOGRAPHY N/A 02/03/2018   Procedure: LEFT HEART CATH AND CORONARY ANGIOGRAPHY;  Surgeon: Martinique, Peter M, MD;  Location: [place] CV LAB;  Service: Cardiovascular;  Laterality: N/A;  . LEFT HEART CATH AND CORS/GRAFTS ANGIOGRAPHY N/A 02/12/2019   Procedure: LEFT HEART CATH AND CORS/GRAFTS ANGIOGRAPHY;  Surgeon: Wellington Hampshire, MD;  Location: [place] CV LAB;  Service: Cardiovascular;  Laterality: N/A;  . MEDIASTINOSCOPY N/A 03/11/2014   Procedure: MEDIASTINOSCOPY;  Surgeon: Melrose Nakayama, MD;  Location: [place];  Service: Thoracic;  Laterality: N/A;  . PLACEMENT OF [place] LEFT VENTRICULAR ASSIST DEVICE  02/03/2018   Procedure: PLACEMENT OF [place] LEFT VENTRICULAR ASSIST DEVICE LD;  Surgeon: Rexene Alberts, MD;  Location: [place];  Service: Open Heart Surgery;;  . REMOVAL OF IMPELLA  LEFT VENTRICULAR ASSIST DEVICE N/A 02/08/2018   Procedure: REMOVAL OF [place] LEFT VENTRICULAR ASSIST DEVICE;  Surgeon: Rexene Alberts, MD;  Location: [place];  Service: Open Heart Surgery;  Laterality: N/A;  . RIGHT HEART CATH N/A 02/03/2018   Procedure: RIGHT HEART CATH;  Surgeon: Martinique, Peter M, MD;  Location: [place] CV LAB;  Service: Cardiovascular;  Laterality: N/A;  . RIGHT HEART CATH N/A 05/09/2018   Procedure: RIGHT HEART CATH;  Surgeon: Jolaine Artist, MD;  Location: [place] CV LAB;  Service: Cardiovascular;  Laterality: N/A;  . TEE  WITHOUT CARDIOVERSION N/A 02/08/2018   Procedure: TRANSESOPHAGEAL ECHOCARDIOGRAM (TEE);  Surgeon: Rexene Alberts, MD;  Location: [place];  Service: Open Heart Surgery;  Laterality: N/A;  . TUBAL LIGATION    . VIDEO BRONCHOSCOPY WITH ENDOBRONCHIAL ULTRASOUND N/A 03/11/2014   Procedure: VIDEO BRONCHOSCOPY WITH ENDOBRONCHIAL ULTRASOUND;  Surgeon: Melrose Nakayama, MD;  Location: [place];  Service: Thoracic;  Laterality: N/A;     No current facility-administered medications for this encounter.   Current Outpatient Medications  Medication Sig Dispense Refill  . albuterol (VENTOLIN HFA) 108 (90 Base) MCG/ACT inhaler Inhale 2 puffs into the lungs every 6 (six) hours as needed for wheezing or shortness of breath. 1 Inhaler 0  . ALPRAZolam (XANAX) 0.25 MG tablet Take 1 tablet (0.25 mg total) by mouth daily as needed for anxiety. 10 tablet 0  . aspirin EC 81 MG tablet Take 1 tablet (81 mg total) by mouth daily. Swallow whole. 30 tablet 3  . atorvastatin (LIPITOR) 80 MG tablet Take 1 tablet (80 mg total) by mouth daily at 6 PM. 30 tablet 1  . dapagliflozin propanediol (FARXIGA) 10 MG TABS tablet Take 1 tablet (10 mg total) by mouth daily before breakfast. 30 tablet 6  . feeding supplement (ENSURE ENLIVE / ENSURE PLUS) LIQD Take 237 mLs by mouth 3 (three) times daily between meals. 237 mL 12  . ipratropium-albuterol (DUONEB) 0.5-2.5 (3) MG/3ML SOLN Take 3 mLs by nebulization 2 (two) times daily as needed.    . ivabradine (CORLANOR) 7.5 MG TABS tablet Take 1 tablet (7.5 mg total) by mouth 2 (two) times daily with a meal. 60 tablet 5  . metolazone (ZAROXOLYN) 2.5 MG tablet Take 1 tablet (2.5 mg total) by mouth as directed. Take today and tomorrow and then hold till directed 10 tablet 0  . nitroGLYCERIN (NITROSTAT) 0.4 MG SL tablet Place 1 tablet (0.4 mg total) under the tongue every 5 (five) minutes as needed for chest pain. 90 tablet 5  . potassium chloride SA (KLOR-CON) 20 MEQ tablet Take 2 tablets (40 mEq  total) by mouth daily. Take 61mq (2 tablets) with metolazone doses 120 tablet 3  . torsemide (DEMADEX) 20 MG tablet Take 3 tablets (60 mg total) by mouth daily. 90 tablet 3    Allergies:   Codeine   Social History:  The patient  reports that she quit smoking about 6 years ago. Her smoking use included cigarettes. She has a 20.00 pack-year smoking history. She has never used smokeless tobacco. She reports current alcohol use of about 8.0 standard drinks of alcohol per week. She reports current drug use. Drug: Marijuana.   Family History:  The patient's family history includes Cancer in her maternal grandmother; Diabetes in her paternal grandmother; Heart attack in her mother; Heart disease in her mother; Hypertension in her maternal grandmother and mother.   ROS:  Please see the history of present illness.   All other  systems are personally reviewed and negative.    There were no vitals filed for this visit. PHYSICAL EXAM: General:  Chronically ill appearing/ thin female, appears much older than actual age. Mildly dyspneic while speaking HEENT: normal Neck: supple. No JVD. Carotids 2+ bilat; no bruits. No lymphadenopathy or thyromegaly appreciated. Cor: PMI nondisplaced. Regular rate & rhythm. No rubs, gallops or murmurs. Lungs: Ronchi all lung fields. Abdomen: Soft, nontender, mildly distended/ edematous. No hepatosplenomegaly. No bruits or masses. Good bowel sounds. Extremities: Thin extremities, no cyanosis, clubbing, rash, edema Neuro: Alert & oriented x 3, cranial nerves grossly intact. moves all 4 extremities w/o difficulty. Affect pleasant.  ICD today:  Optivol fluid index below threshold, stable thoracic impedence, daily activity ~2 hrs, no VT/VF (personally reviewed).  ECG: ST 100 bpm, no ST changes (personally reviewed).  Recent Labs: 10/20/2020: B Natriuretic Peptide 1,118.4 11/15/2020: ALT 18; Hemoglobin 16.4; Platelets 266 01/27/2021: BUN 13; Creatinine, Ser 0.95; Potassium  3.0; Sodium 139  Personally reviewed   Wt Readings from Last 3 Encounters:  02/09/21 47.7 kg  01/27/21 47.3 kg  11/15/20 46.4 kg    ASSESSMENT AND PLAN:  1. Chest pain, unstable angina  - Hx of NSTEMI/STEMI s/p Emergent CABG 02/03/18: Cath with severe 2v CAD as above with severe LV dysfunction/ICM. s/p Emergent CABG 02/03/18. - Had NSTEMI and now s/p LHC 02/12/19 with DES to the ostial ramus extending into the distal left main - Brilinta stopped at recent discharge due to bleeding. - On ASA. Continue hi-intensity statin. - Cycle troponin, cath may be indicated based on results. - Order SL Nitro - EKG in clinic with no ischemic changes.   2. Dyspnea, suspected acute exacerbation COPD - Ronchi on lung exam. CXR - Oxygen 100% on room air, euvolemic on exam and by OptiVol - multifactorial, ? COPD exacerbation. - CBC - Duonebs - May need steroids/antibiotics.     3.ChronicSystolic HF due to ICM. S/p Medtronic single chamber ICD. -Echo 09/04/18 ([facility]) LVEF 20%, Moderate AI, Mild MR, Mild TR, Severe LAE, RV mildly decreased.  - Echo 02/12/19: EF 25-30%, RV mildly reduced - Echo 11/21 EF 20-25% RV mild HK. - NYHA III, chronic. Volume good on exam & OptiVol, Reds 27%. Weight stable.  - Continue torsemide 60 mg daily + 40 mEq of KCl daily.  - Continue spiro 25 mg qHS.  - Continue corlanor 7.5 mg bid. - Continue Farxiga 10 mg daily. - No b-blocker yet due to intolerance with low output - has ICD. No VT/VF  4. Recurrent pleural effusions s/p pleurodesis - CXR now - Trace pleural effusion on right on CXR on recent admit.  - O2 sats 100% on RA   5. Chronic hypoxic respiratory failure - Multifactorial - Wears O2 PRN at home. O2 sats 100% on RA today.  - She is followed by pulmonology.   5. PAF - CHA2DS2/VASc is at least 4. (CHF, Vasc disease, HTN, Female).  - She has history of Afib RVR in the past in the setting of her Lung CA. Previously on Xarelto but stopped.  - On ASA. - No  afib byICD interrogation today.   6. 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  - Chest CT 02/19/18 with mass-like consolidation in R hilum concerning for recurrent tumor.  - Repeat CT 03/27/2018 -No definite findings of locally recurrent tumor in the right lung. 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 Continued chest CT surveillance is  advised in 3-6 months. - S/p thoracentesis 04/2018. Cytology negative for malignancy.  - CTA 11/19 stable scarring in the right hilum consistent with emphysema. Bronchoscopy on 11/15, no obvious source of bleeding  Will need to admit, cycle HS Troponin to determine next steps.  Weston Brass Rafael Bihari, FNP-BC 02/09/2021 3:51 PM  Patient seen and examined with the above-signed Advanced Practice Provider and/or Housestaff. I personally reviewed laboratory data, imaging studies and relevant notes. I independently examined the patient and formulated the important aspects of the plan. I have edited the note to reflect any of my changes or salient points. I have personally discussed the plan with the patient and/or family.  60 y/o woman with CAD s/p CABG followed by DES to Ramus in 2/95, systolic HF due to iCM and COPD. Seen in clinic today with worsening CP and SOB. CP reminiscent of previous angina.   General:  Weak appearing. HEENT: normal Neck: supple. JVP 7-8 Carotids 2+ bilat; no bruits. No lymphadenopathy or thryomegaly appreciated. Cor: PMI nondisplaced. Regular tachy  No rubs, gallops or murmurs. Lungs: + rhonchi wheezing  Abdomen: soft, nontender, nondistended. No hepatosplenomegaly. No bruits or masses. Good bowel sounds. Extremities: no cyanosis, clubbing, rash, edema Neuro: alert & orientedx3, cranial nerves grossly intact. moves all 4 extremities w/o difficulty. Affect pleasant  ECG ST 100 No acute ST-T changes  Exam most c/w COPD flare but symptoms  reminiscent of previous ACS. Will admit. Start heparin. Cycle Trop. Treat for COPD flare. If troponin elevated or symptoms do not completely resolve with treatment of COPD will need cath.   Glori Bickers, MD  6:11 PM

## 2021-02-09 NOTE — Addendum Note (Signed)
Encounter addended by: Malena Edman, RN on: 02/09/2021 3:34 PM  Actions taken: Vitals modified

## 2021-02-09 NOTE — Progress Notes (Addendum)
ReDS Vest / Clip - 02/09/21 1400      ReDS Vest / Clip   Station Marker A    Ruler Value 27    ReDS Value Range Low volume    ReDS Actual Value 27

## 2021-02-09 NOTE — Progress Notes (Addendum)
Advanced HF Clinic Note   PCP:  Scifres, Dorothy, PA-C  Cardiologist:  No primary care provider on file. 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 Underwent emergent cath 02/03/18 showed LAD 100% stenosed, LCx 95% stenosed. Taken for emergent CABG 02/03/18. Required impella post op. Hospital course complicated by cardiogenic shock, HCAP, A fib, respiratory failure, and swallowing issues. She was discharged to SNF. Discharge weight 103 pounds.   In 2019 had multiple hospitalizations for HF and pleural effusion. Underwent pleurodesis at [facility].  Admitted 3/20 with NSTEMI and HF. Received DES to ostial ramus into distal left main based on cath below and underwent diuresis. Meds adjusted as tolerated. Echo with EF 25-30%.   Admitted 11/21 with COPD flare with mild HF and hemoptysis. CTA showed stable scarring in the right hilum consistent with emphysema.. Underwent bronchoscopy on 11/15, no obvious source of bleeding found on examination.   Echo 11/21 EF 20-25%. RV mildly HK.   Had post hospital f/u on 12/1 and was fluid overloaded on device interrogation. She was instructed to take Metolazone 2.64m x 2 days + 40 mEq KCl and torsemide was increased from 40>>60 mg daily.   Had return f/u again on 12/6 and was felt to be dry/ over diuresed. Optivol fluid index was way down. Labs were c/w AKI. SCr had bumped from 0.95>>1.82. BUN 14>>38. She was instructed to stop metolazone and hold torsemide x 2 days, then resume at alternating dosing 60 mg one day and 40 mg the next.  Today she returns for HF follow up. Last visit 3 weeks ago she was volume  overloaded, likely due to fluid indescretion. Torsemide was increased and she was instructed to take an extra dose of metolazone. Now, she has had 4-5 days of chest heaviness with "massive heartburn" with pain going down arm, SOB increasing and feels weak. Chest heaviness happens randomly regardless of activity, lasts ~5-10 minutes. Does get SOB and nauseated. Has not taken nitro as her Rx is expired, heartburn unrelieved by tums. Denies increasing dizziness, edema.  +PND/Orthopnea. Appetite ok. No fever or chills, productive cough. Taking all medications. Uses home oxygen at night prn.  Cardidac Studies: Echo 02/12/19: EF 25-30%, RV mildly reduced  LHC 02/12/19:  Ost LAD to Prox LAD lesion is 100% stenosed.  Ost Cx to Prox Cx lesion is 100% stenosed.  Ost Ramus lesion is 99% stenosed.  Post intervention, there is a 0% residual stenosis.  A drug-eluting stent was successfully placed using a [id number].  SVG and is normal in caliber.  The graft exhibits no disease.  Ost 1st Diag lesion is 70% stenosed.  SVG and is normal in caliber.  Prox Graft lesion is 100% stenosed. 1. Significant underlying two-vessel coronary artery disease with patent SVG to OM and SVG to diagonal which supplies the LAD territory. Atretic LIMA to LAD given competitive flow from SVG to diagonal. Severe ostial ramus artery stenosis not supplied by bypass with his area suggestive of plaque rupture. 2. Severely elevated left ventricular end-diastolic pressure 34 mmHg. Left ventricular angiography was not performed. 3. Successful angioplasty and drug-eluting stent placement to the ostial ramus extending into the  distal left main.  Past Medical History:  Diagnosis Date  . Acute systolic congestive heart failure ([place]) 02/03/2018  . Allergy   . Anxiety   . Asthma   . DM2 (diabetes mellitus, type 2) ([place]) 10/20/2020  . Hypertension   . PAF (paroxysmal atrial fibrillation) ([place])   . Prophylactic  measure 08/03/14-08/19/14   Prophyl. cranial radiation 24 Gy  . 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 Small cell lung cancer ([place]) 03/16/2014   Past Surgical History:  Procedure Laterality Date  . BRONCHIAL BRUSHINGS  10/25/2020   Procedure: BRONCHIAL BRUSHINGS;  Surgeon: Collene Gobble, MD;  Location: [facility] ENDOSCOPY;  Service: Pulmonary;;  . BRONCHIAL NEEDLE ASPIRATION BIOPSY  10/25/2020   Procedure: BRONCHIAL NEEDLE ASPIRATION BIOPSIES;  Surgeon: Collene Gobble, MD;  Location: [facility] ENDOSCOPY;  Service: Pulmonary;;  . CARDIAC DEFIBRILLATOR PLACEMENT  08/15/2018   MDT Visia AF MRI VR ICD implanted by Dr Loel Lofty for primary prevention of sudden  . CESAREAN SECTION    . CORONARY ARTERY BYPASS GRAFT N/A 02/03/2018   Procedure: CORONARY ARTERY BYPASS GRAFTING (CABG);  Surgeon: Rexene Alberts, MD;  Location: [place];  Service: Open Heart Surgery;  Laterality: N/A;  Time 3 using left internal mammary artery and endoscopically harvested right saphenous vein  . CORONARY BALLOON ANGIOPLASTY N/A 02/03/2018   Procedure: CORONARY BALLOON ANGIOPLASTY;  Surgeon: Martinique, Peter M, MD;  Location: [place] CV LAB;  Service: Cardiovascular;  Laterality: N/A;  . CORONARY STENT INTERVENTION N/A 02/12/2019   Procedure: CORONARY STENT INTERVENTION;  Surgeon: Wellington Hampshire, MD;  Location: [place] CV LAB;  Service: Cardiovascular;  Laterality: N/A;  . CORONARY/GRAFT ACUTE MI REVASCULARIZATION N/A 02/03/2018   Procedure: Coronary/Graft Acute MI Revascularization;  Surgeon: Martinique, Peter M, MD;  Location: [place] CV LAB;  Service: Cardiovascular;  Laterality: N/A;  . ENDOBRONCHIAL ULTRASOUND N/A 10/25/2020   Procedure: ENDOBRONCHIAL ULTRASOUND;  Surgeon: Collene Gobble, MD;  Location: [facility] ENDOSCOPY;  Service: Pulmonary;  Laterality: N/A;  . FLEXIBLE BRONCHOSCOPY  10/25/2020   Procedure:  FLEXIBLE BRONCHOSCOPY;  Surgeon: Collene Gobble, MD;  Location: [facility] ENDOSCOPY;  Service: Pulmonary;;  . IABP INSERTION N/A 02/03/2018   Procedure: IABP Insertion;  Surgeon: Martinique, Peter M, MD;  Location: [place] CV LAB;  Service: Cardiovascular;  Laterality: N/A;  . INTRAOPERATIVE TRANSESOPHAGEAL ECHOCARDIOGRAM N/A 02/03/2018   Procedure: INTRAOPERATIVE TRANSESOPHAGEAL ECHOCARDIOGRAM;  Surgeon: Rexene Alberts, MD;  Location: [place];  Service: Open Heart Surgery;  Laterality: N/A;  . LEFT HEART CATH AND CORONARY ANGIOGRAPHY N/A 02/03/2018   Procedure: LEFT HEART CATH AND CORONARY ANGIOGRAPHY;  Surgeon: Martinique, Peter M, MD;  Location: [place] CV LAB;  Service: Cardiovascular;  Laterality: N/A;  . LEFT HEART CATH AND CORS/GRAFTS ANGIOGRAPHY N/A 02/12/2019   Procedure: LEFT HEART CATH AND CORS/GRAFTS ANGIOGRAPHY;  Surgeon: Wellington Hampshire, MD;  Location: [place] CV LAB;  Service: Cardiovascular;  Laterality: N/A;  . MEDIASTINOSCOPY N/A 03/11/2014   Procedure: MEDIASTINOSCOPY;  Surgeon: Melrose Nakayama, MD;  Location: [place];  Service: Thoracic;  Laterality: N/A;  . PLACEMENT OF [place] LEFT VENTRICULAR ASSIST DEVICE  02/03/2018   Procedure: PLACEMENT OF [place] LEFT VENTRICULAR ASSIST DEVICE LD;  Surgeon: Rexene Alberts, MD;  Location: [place];  Service: Open Heart Surgery;;  . REMOVAL OF IMPELLA LEFT VENTRICULAR ASSIST DEVICE N/A 02/08/2018  Procedure: REMOVAL OF [place] LEFT VENTRICULAR ASSIST DEVICE;  Surgeon: Rexene Alberts, MD;  Location: [place];  Service: Open Heart Surgery;  Laterality: N/A;  . RIGHT HEART CATH N/A 02/03/2018   Procedure: RIGHT HEART CATH;  Surgeon: Martinique, Peter M, MD;  Location: [place] CV LAB;  Service: Cardiovascular;  Laterality: N/A;  . RIGHT HEART CATH N/A 05/09/2018   Procedure: RIGHT HEART CATH;  Surgeon: Jolaine Artist, MD;  Location: [place] CV LAB;  Service: Cardiovascular;  Laterality: N/A;  . TEE WITHOUT CARDIOVERSION N/A 02/08/2018    Procedure: TRANSESOPHAGEAL ECHOCARDIOGRAM (TEE);  Surgeon: Rexene Alberts, MD;  Location: [place];  Service: Open Heart Surgery;  Laterality: N/A;  . TUBAL LIGATION    . VIDEO BRONCHOSCOPY WITH ENDOBRONCHIAL ULTRASOUND N/A 03/11/2014   Procedure: VIDEO BRONCHOSCOPY WITH ENDOBRONCHIAL ULTRASOUND;  Surgeon: Melrose Nakayama, MD;  Location: [place];  Service: Thoracic;  Laterality: N/A;     Current Outpatient Medications  Medication Sig Dispense Refill  . albuterol (VENTOLIN HFA) 108 (90 Base) MCG/ACT inhaler Inhale 2 puffs into the lungs every 6 (six) hours as needed for wheezing or shortness of breath. 1 Inhaler 0  . ALPRAZolam (XANAX) 0.25 MG tablet Take 1 tablet (0.25 mg total) by mouth daily as needed for anxiety. 10 tablet 0  . aspirin EC 81 MG tablet Take 1 tablet (81 mg total) by mouth daily. Swallow whole. 30 tablet 3  . atorvastatin (LIPITOR) 80 MG tablet Take 1 tablet (80 mg total) by mouth daily at 6 PM. 30 tablet 1  . dapagliflozin propanediol (FARXIGA) 10 MG TABS tablet Take 1 tablet (10 mg total) by mouth daily before breakfast. 30 tablet 6  . feeding supplement (ENSURE ENLIVE / ENSURE PLUS) LIQD Take 237 mLs by mouth 3 (three) times daily between meals. 237 mL 12  . ipratropium-albuterol (DUONEB) 0.5-2.5 (3) MG/3ML SOLN Take 3 mLs by nebulization 2 (two) times daily as needed.    . ivabradine (CORLANOR) 7.5 MG TABS tablet Take 1 tablet (7.5 mg total) by mouth 2 (two) times daily with a meal. 60 tablet 5  . metolazone (ZAROXOLYN) 2.5 MG tablet Take 1 tablet (2.5 mg total) by mouth as directed. Take today and tomorrow and then hold till directed 10 tablet 0  . nitroGLYCERIN (NITROSTAT) 0.4 MG SL tablet Place 1 tablet (0.4 mg total) under the tongue every 5 (five) minutes as needed for chest pain. 90 tablet 5  . potassium chloride SA (KLOR-CON) 20 MEQ tablet Take 2 tablets (40 mEq total) by mouth daily. Take 31mq (2 tablets) with metolazone doses 120 tablet 3  . torsemide (DEMADEX) 20  MG tablet Take 3 tablets (60 mg total) by mouth daily. 90 tablet 3   No current facility-administered medications for this encounter.    Allergies:   Codeine   Social History:  The patient  reports that she quit smoking about 6 years ago. Her smoking use included cigarettes. She has a 20.00 pack-year smoking history. She has never used smokeless tobacco. She reports current alcohol use of about 8.0 standard drinks of alcohol per week. She reports current drug use. Drug: Marijuana.   Family History:  The patient's family history includes Cancer in her maternal grandmother; Diabetes in her paternal grandmother; Heart attack in her mother; Heart disease in her mother; Hypertension in her maternal grandmother and mother.   ROS:  Please see the history of present illness.   All other systems are personally reviewed and negative.  Vitals:   02/09/21 1433  BP: 110/75  Pulse: (!) 105  SpO2: 100%  Weight: 47.7 kg (105 lb 3.2 oz)   PHYSICAL EXAM: General:  Chronically ill appearing/ thin female, appears much older than actual age. Mildly dyspneic while speaking HEENT: normal Neck: supple. No JVD. Carotids 2+ bilat; no bruits. No lymphadenopathy or thyromegaly appreciated. Cor: PMI nondisplaced. Regular rate & rhythm. No rubs, gallops or murmurs. Lungs: Ronchi all lung fields. Abdomen: Soft, nontender, mildly distended/ edematous. No hepatosplenomegaly. No bruits or masses. Good bowel sounds. Extremities: Thin extremities, no cyanosis, clubbing, rash, edema Neuro: Alert & oriented x 3, cranial nerves grossly intact. moves all 4 extremities w/o difficulty. Affect pleasant.  ICD today:  Optivol fluid index below threshold, stable thoracic impedence, daily activity ~2 hrs, no VT/VF (personally reviewed). Reds: 27% ECG: ST 100 bpm, no ST changes (personally reviewed).  Recent Labs: 10/20/2020: B Natriuretic Peptide 1,118.4 11/15/2020: ALT 18; Hemoglobin 16.4; Platelets 266 01/27/2021: BUN 13;  Creatinine, Ser 0.95; Potassium 3.0; Sodium 139  Personally reviewed   Wt Readings from Last 3 Encounters:  02/09/21 47.7 kg (105 lb 3.2 oz)  01/27/21 47.3 kg (104 lb 3.2 oz)  11/15/20 46.4 kg (102 lb 3.2 oz)    ASSESSMENT AND PLAN:  1. Chest pain, unstable angina  - Hx of NSTEMI/STEMI s/p Emergent CABG 02/03/18: Cath with severe 2v CAD as above with severe LV dysfunction/ICM. s/p Emergent CABG 02/03/18. - Had NSTEMI and now s/p LHC 02/12/19 with DES to the ostial ramus extending into the distal left main - Brilinta stopped at recent discharge due to bleeding. - On ASA. Continue hi-intensity statin. - Cycle troponin, cath may be indicated based on results. - EKG in clinic with no ischemic changes.   2. Dyspnea, suspected acute exacerbation COPD - Ronchi on lung exam. CXR - Oxygen 100% on room air, euvolemic on exam and by OptiVol - multifactorial, ? COPD exacerbation. - CBC - Duonebs, steroids and abx    3.ChronicSystolic HF due to ICM. S/p Medtronic single chamber ICD. -Echo 09/04/18 ([facility]) LVEF 20%, Moderate AI, Mild MR, Mild TR, Severe LAE, RV mildly decreased.  - Echo 02/12/19: EF 25-30%, RV mildly reduced - Echo 11/21 EF 20-25% RV mild HK. - NYHA III, chronic. Volume good on exam & OptiVol, Reds 27%. Weight stable.  - Continue torsemide 60 mg daily + 40 mEq of KCl daily.  - Continue spiro 25 mg qHS.  - Continue corlanor 7.5 mg bid. - Continue Farxiga 10 mg daily. - No b-blocker yet due to intolerance with low output - has ICD. No VT/VF  4. Recurrent pleural effusions s/p pleurodesis - CXR now - Trace pleural effusion on right on CXR on recent admit.  - O2 sats 100% on RA   5. Chronic hypoxic respiratory failure - Multifactorial - Wears O2 PRN at home. O2 sats 100% on RA today.  - She is followed by pulmonology.   5. PAF - CHA2DS2/VASc is at least 4. (CHF, Vasc disease, HTN, Female).  - She has history of Afib RVR in the past in the setting of her Lung CA.  Previously on Xarelto but stopped.  - On ASA. - No afib byICD interrogation today.   6. 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  - Chest CT 02/19/18 with mass-like consolidation in R hilum concerning for recurrent tumor.  - Repeat CT 03/27/2018 -No definite findings of locally recurrent tumor in the right lung. 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 Continued chest CT surveillance is advised in 3-6 months. - S/p thoracentesis 04/2018. Cytology negative for malignancy.  - CTA 11/19 stable scarring in the right hilum consistent with emphysema. Bronchoscopy on 11/15, no obvious source of bleeding  Will need to admit, cycle HS Troponin to determine next steps.  Frankey Poot, FNP-BC 02/09/2021 2:37 PM

## 2021-02-09 NOTE — Addendum Note (Signed)
Encounter addended by: Malena Edman, RN on: 02/09/2021 4:28 PM  Actions taken: Vitals modified

## 2021-02-09 NOTE — Telephone Encounter (Signed)
Pt left VM stating she has a lab appt today but feels like her fluid is up. Pt asked that she be evaluated she is short of breath and doesn't feel well. I spoke with Darden Palmer and she said pt can be added to APP schedule today at 3pm. Called pt to offer appt and she did not answer and her voicemail box is not set up.

## 2021-02-09 NOTE — Addendum Note (Signed)
Encounter addended by: Malena Edman, RN on: 02/09/2021 3:24 PM  Actions taken: Visit diagnoses modified, Order list changed, Diagnosis association updated

## 2021-02-09 NOTE — Addendum Note (Signed)
Encounter addended by: Malena Edman, RN on: 02/09/2021 4:13 PM  Actions taken: Vitals modified

## 2021-02-10 ENCOUNTER — Inpatient Hospital Stay (HOSPITAL_COMMUNITY): Payer: BC Managed Care – PPO

## 2021-02-10 DIAGNOSIS — I5022 Chronic systolic (congestive) heart failure: Secondary | ICD-10-CM | POA: Diagnosis not present

## 2021-02-10 LAB — ECHOCARDIOGRAM COMPLETE
Area-P 1/2: 7.44 cm2
P 1/2 time: 231 msec
S' Lateral: 5.4 cm
Single Plane A4C EF: 29 %
Weight: 1682.55 oz

## 2021-02-10 LAB — SARS CORONAVIRUS 2 (TAT 6-24 HRS): SARS Coronavirus 2: NEGATIVE

## 2021-02-10 LAB — CBC
HCT: 47.2 % — ABNORMAL HIGH (ref 36.0–46.0)
Hemoglobin: 15.9 g/dL — ABNORMAL HIGH (ref 12.0–15.0)
MCH: 30.9 pg (ref 26.0–34.0)
MCHC: 33.7 g/dL (ref 30.0–36.0)
MCV: 91.8 fL (ref 80.0–100.0)
Platelets: 177 10*3/uL (ref 150–400)
RBC: 5.14 MIL/uL — ABNORMAL HIGH (ref 3.87–5.11)
RDW: 19 % — ABNORMAL HIGH (ref 11.5–15.5)
WBC: 5.8 10*3/uL (ref 4.0–10.5)
nRBC: 0 % (ref 0.0–0.2)

## 2021-02-10 LAB — TROPONIN I (HIGH SENSITIVITY)
Troponin I (High Sensitivity): 30 ng/L — ABNORMAL HIGH (ref <18)
Troponin I (High Sensitivity): 20 ng/L — ABNORMAL HIGH (ref <18)
Troponin I (High Sensitivity): 16 ng/L (ref <18)

## 2021-02-10 LAB — BASIC METABOLIC PANEL
Anion gap: 13 (ref 5–15)
BUN: 13 mg/dL (ref 6–20)
CO2: 23 mmol/L (ref 22–32)
Calcium: 9.3 mg/dL (ref 8.9–10.3)
Chloride: 97 mmol/L — ABNORMAL LOW (ref 98–111)
Creatinine, Ser: 0.97 mg/dL (ref 0.44–1.00)
GFR, Estimated: >60 mL/min (ref >60)
Glucose, Bld: 106 mg/dL — ABNORMAL HIGH (ref 70–99)
Potassium: 3.3 mmol/L — ABNORMAL LOW (ref 3.5–5.1)
Sodium: 133 mmol/L — ABNORMAL LOW (ref 135–145)

## 2021-02-10 MED ORDER — ASPIRIN 81 MG PO CHEW
81.0000 mg | CHEWABLE_TABLET | ORAL | Status: AC
  Administered 2021-02-11: 81 mg via ORAL
  Filled 2021-02-10: qty 1

## 2021-02-10 MED ORDER — METHYLPREDNISOLONE SODIUM SUCC 125 MG IJ SOLR
60.0000 mg | Freq: Four times a day (QID) | INTRAMUSCULAR | Status: AC
  Administered 2021-02-10 – 2021-02-11 (×4): 60 mg via INTRAVENOUS
  Filled 2021-02-10 (×4): qty 2

## 2021-02-10 MED ORDER — SODIUM CHLORIDE 0.9% FLUSH
3.0000 mL | Freq: Two times a day (BID) | INTRAVENOUS | Status: DC
  Administered 2021-02-10 – 2021-02-12 (×3): 3 mL via INTRAVENOUS

## 2021-02-10 MED ORDER — ALBUTEROL SULFATE (2.5 MG/3ML) 0.083% IN NEBU
2.5000 mg | INHALATION_SOLUTION | Freq: Four times a day (QID) | RESPIRATORY_TRACT | Status: DC
  Administered 2021-02-10 – 2021-02-12 (×8): 2.5 mg via RESPIRATORY_TRACT
  Filled 2021-02-10 (×9): qty 3

## 2021-02-10 MED ORDER — SODIUM CHLORIDE 0.9 % IV SOLN: INTRAVENOUS | Status: DC

## 2021-02-10 MED ORDER — DOXYCYCLINE HYCLATE 100 MG PO TABS
100.0000 mg | ORAL_TABLET | Freq: Two times a day (BID) | ORAL | Status: DC
  Administered 2021-02-10 – 2021-02-12 (×5): 100 mg via ORAL
  Filled 2021-02-10 (×5): qty 1

## 2021-02-10 MED ORDER — FUROSEMIDE 10 MG/ML IJ SOLN
40.0000 mg | Freq: Once | INTRAMUSCULAR | Status: AC
  Administered 2021-02-10: 40 mg via INTRAVENOUS
  Filled 2021-02-10: qty 4

## 2021-02-10 MED ORDER — PREDNISONE 20 MG PO TABS
40.0000 mg | ORAL_TABLET | Freq: Every day | ORAL | Status: DC
  Administered 2021-02-10: 40 mg via ORAL
  Filled 2021-02-10: qty 2

## 2021-02-10 MED ORDER — SPIRONOLACTONE 12.5 MG HALF TABLET
12.5000 mg | ORAL_TABLET | Freq: Every day | ORAL | Status: DC
  Administered 2021-02-10 – 2021-02-11 (×2): 12.5 mg via ORAL
  Filled 2021-02-10 (×2): qty 1

## 2021-02-10 MED ORDER — POTASSIUM CHLORIDE CRYS ER 20 MEQ PO TBCR
40.0000 meq | EXTENDED_RELEASE_TABLET | ORAL | Status: AC
  Administered 2021-02-10 (×2): 40 meq via ORAL
  Filled 2021-02-10 (×2): qty 2

## 2021-02-10 MED ORDER — SODIUM CHLORIDE 0.9% FLUSH: 3.0000 mL | INTRAVENOUS | Status: DC | PRN

## 2021-02-10 MED ORDER — SODIUM CHLORIDE 0.9 % IV SOLN: 250.0000 mL | INTRAVENOUS | Status: DC | PRN

## 2021-02-10 NOTE — Addendum Note (Signed)
Encounter addended by: Rafael Bihari, FNP on: 02/10/2021 8:24 AM  Actions taken: Clinical Note Signed

## 2021-02-10 NOTE — Progress Notes (Signed)
Informed consent signed by pt and place in pts chart.

## 2021-02-10 NOTE — Progress Notes (Signed)
  Echocardiogram 2D Echocardiogram has been performed.  Brittany [name] 02/10/2021, 10:34 AM

## 2021-02-10 NOTE — Progress Notes (Addendum)
Advanced Heart Failure Rounding Note  PCP-Cardiologist: No primary care provider on file.   Subjective:    H/o CAD s/p CABG in 01/2018, followed by PCI + DES to the ostial ramus in 02/2019. Admitted for CP reminiscent of previous angina. ? COPD flare. Hs trop low level and flat, 21>>30.   Hgb 16 Na 133 K 3.3 SCr 0.97 PCT <0.10   Had mild CP earlier this am. Currently pain free. Wheezing noted on exam.    Objective:   Weight Range: 47.7 kg Body mass index is 21.24 kg/m.   Vital Signs:   Temp:  [98 F (36.7 C)-99 F (37.2 C)] 98.3 F (36.8 C) (03/03 0400) Pulse Rate:  [94-99] 94 (03/03 0400) Resp:  [18-23] 19 (03/03 0400) BP: (102-122)/(68-78) 102/70 (03/03 0400) SpO2:  [100 %] 100 % (03/03 0400) Weight:  [47.7 kg] 47.7 kg (03/03 0416)    Weight change: Filed Weights   02/10/21 0416  Weight: 47.7 kg    Intake/Output:   Intake/Output Summary (Last 24 hours) at 02/10/2021 0709 Last data filed at 02/10/2021 0000 Gross per 24 hour  Intake 240 ml  Output 600 ml  Net -360 ml      Physical Exam    General:  Fatigue appearing thin middle aged female. No resp difficulty HEENT: Normal Neck: Supple. JVP 7 cm . Carotids 2+ bilat; no bruits. No lymphadenopathy or thyromegaly appreciated. Cor: PMI nondisplaced. Regular rate & rhythm. No rubs, gallops or murmurs. Lungs: Diffuse rhonchi and wheezing bilaterally  Abdomen: Soft, nontender, nondistended. No hepatosplenomegaly. No bruits or masses. Good bowel sounds. Extremities: No cyanosis, clubbing, rash, edema Neuro: Alert & orientedx3, cranial nerves grossly intact. moves all 4 extremities w/o difficulty. Affect pleasant   Telemetry   NSR 90s   EKG    No new EKG to review   Labs    CBC Recent Labs    02/09/21 1557 02/10/21 0030  WBC 6.2 5.8  HGB 17.1* 15.9*  HCT 52.9* 47.2*  MCV 93.6 91.8  PLT 204 283   Basic Metabolic Panel Recent Labs    02/09/21 1557 02/10/21 0030  NA 139 133*  K 3.3*  3.3*  CL 98 97*  CO2 28 23  GLUCOSE 127* 106*  BUN 11 13  CREATININE 0.92 0.97  CALCIUM 10.7* 9.3   Liver Function Tests Recent Labs    02/09/21 1557  AST 25  ALT 16  ALKPHOS 94  BILITOT 1.1  PROT 7.7  ALBUMIN 4.3   No results for input(s): LIPASE, AMYLASE in the last 72 hours. Cardiac Enzymes No results for input(s): CKTOTAL, CKMB, CKMBINDEX, TROPONINI in the last 72 hours.  BNP: BNP (last 3 results) Recent Labs    05/21/20 1102 10/20/20 1702  BNP 896.5* 1,118.4*    ProBNP (last 3 results) No results for input(s): PROBNP in the last 8760 hours.   D-Dimer No results for input(s): DDIMER in the last 72 hours. Hemoglobin A1C No results for input(s): HGBA1C in the last 72 hours. Fasting Lipid Panel No results for input(s): CHOL, HDL, LDLCALC, TRIG, CHOLHDL, LDLDIRECT in the last 72 hours. Thyroid Function Tests Recent Labs    02/09/21 1557  TSH 0.765    Other results:   Imaging     No results found.   Medications:     Scheduled Medications: . aspirin EC  81 mg Oral Daily  . atorvastatin  80 mg Oral q1800  . dapagliflozin propanediol  10 mg Oral QAC breakfast  .  enoxaparin (LOVENOX) injection  50 mg Subcutaneous Q12H  . ivabradine  7.5 mg Oral BID WC  . potassium chloride  40 mEq Oral Q4H  . torsemide  60 mg Oral Daily     Infusions:   PRN Medications:  acetaminophen, albuterol, ALPRAZolam, ipratropium-albuterol, nitroGLYCERIN, ondansetron (ZOFRAN) IV     Assessment/Plan    1. Chest pain, unstable angina  - Hx of NSTEMI/STEMI s/p Emergent CABG 02/03/18: Cath with severe 2v CAD as above with severe LV dysfunction/ICM. s/p Emergent CABG 02/03/18. - Had NSTEMI and now s/p LHC 02/12/19 with DES to the ostial ramus extending into the distal left main - Brilinta stopped at recent discharge due to bleeding. - On ASA 81 + atorva 80 - Hs trop flat and low level, 21>>30. Still w/ intermittent CP reminiscent of previous angina. Recommend  definative LHC tomorrow  2. Dyspnea, suspected acute exacerbation COPD - Ronchi  + wheezing on lung exam.  - Order CXR  - Change albuterol to scheduled  - start solumedrol 60 mg q6H x 4 doses - start Doxycline 100 mg bid x 5 days   3.ChronicSystolic HF due to ICM. S/p Medtronic single chamber ICD. -Echo 09/04/18 ([facility]) LVEF 20%, Moderate AI, Mild MR, Mild TR, Severe LAE, RV mildly decreased.  - Echo 02/12/19: EF 25-30%, RV mildly reduced - Echo 11/21 EF 20-25% RV mild HK. - NYHA III, chronic. Mildly fluid overload, give 40 IV Lasix x 1 - Continue torsemide 60 mg daily tomorrow  - Restart Spiro 12.5 qhs  - Continue corlanor 7.5 mg bid. - Continue Farxiga 10 mg daily. - No  blocker w/ likely acute COPDE  - has ICD. No VT/VF  4. H/o Recurrent pleural effusionss/p pleurodesis - check CXR   5. Acute on Chronic hypoxic respiratory failure - Multifactorial, suspect CODE predominate  - Wears O2 PRN at home.  - She is followed by pulmonology.   5. PAF - CHA2DS2/VASc is at least 4. (CHF, Vasc disease, HTN, Female).  - She has history of Afib RVR in the past in the setting of her Lung CA. Previously on Xarelto but stopped.  - On ASA. - NSR on tele   6. 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  - Chest CT 02/19/18 with mass-like consolidation in R hilum concerning for recurrent tumor.  - Repeat CT 03/27/2018 -No definite findings of locally recurrent tumor in the right lung. 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 Continued chest CT surveillance is advised in 3-6 months. - S/p thoracentesis 04/2018. Cytology negative for malignancy.  - CTA 11/19 stable scarring in the right hilum consistent with emphysema. Bronchoscopy on 11/15, no obvious source of bleeding    Length of Stay: 1  Brittainy Simmons, PA-C  02/10/2021, 7:09 AM  Advanced Heart Failure Team Pager [phone] (M-F; 7a - 4p)   Please contact [facility] for night-coverage after hours (4p -7a ) and weekends on [email or web address]  Patient seen and examined with the above-signed Advanced Practice Provider and/or Housestaff. I personally reviewed laboratory data, imaging studies and relevant notes. I independently examined the patient and formulated the important aspects of the plan. I have edited the note to reflect any of my changes or salient points. I have personally discussed the plan with the patient and/or family.  Remains SOB and still having chest pressure. hstrop ok.   General:  Weak appearing. No resp difficulty HEENT: normal Neck: supple. JVP 6-7. Carotids  2+ bilat; no bruits. No lymphadenopathy or thryomegaly appreciated. Cor: PMI nondisplaced. Regular rate & rhythm. No rubs, gallops or murmurs. Lungs: + rhonchi and wheezing Abdomen: soft, nontender, nondistended. No hepatosplenomegaly. No bruits or masses. Good bowel sounds. Extremities: no cyanosis, clubbing, rash, edema Neuro: alert & orientedx3, cranial nerves grossly intact. moves all 4 extremities w/o difficulty. Affect pleasant  Still with wheezing and rhonchi. Will increase steroids. Schedule nebs and start abx. Given ongoing CP will need R/L cath tomorrow. Continue heparin.   Glori Bickers, MD  4:09 PM

## 2021-02-11 ENCOUNTER — Encounter (HOSPITAL_COMMUNITY)
Admission: AD | Disposition: A | Discharge status: Home / Self Care | Payer: Self-pay | Source: Ambulatory Visit | Attending: Internal Medicine

## 2021-02-11 DIAGNOSIS — N179 Acute kidney failure, unspecified: Secondary | ICD-10-CM

## 2021-02-11 LAB — CBC
HCT: 53.2 % — ABNORMAL HIGH (ref 36.0–46.0)
Hemoglobin: 16.8 g/dL — ABNORMAL HIGH (ref 12.0–15.0)
MCH: 30.1 pg (ref 26.0–34.0)
MCHC: 31.6 g/dL (ref 30.0–36.0)
MCV: 95.3 fL (ref 80.0–100.0)
Platelets: 172 10*3/uL (ref 150–400)
RBC: 5.58 MIL/uL — ABNORMAL HIGH (ref 3.87–5.11)
RDW: 18.9 % — ABNORMAL HIGH (ref 11.5–15.5)
WBC: 5.7 10*3/uL (ref 4.0–10.5)
nRBC: 0 % (ref 0.0–0.2)

## 2021-02-11 LAB — BASIC METABOLIC PANEL
Anion gap: 15 (ref 5–15)
BUN: 21 mg/dL — ABNORMAL HIGH (ref 6–20)
CO2: 24 mmol/L (ref 22–32)
Calcium: 10.3 mg/dL (ref 8.9–10.3)
Chloride: 97 mmol/L — ABNORMAL LOW (ref 98–111)
Creatinine, Ser: 1.42 mg/dL — ABNORMAL HIGH (ref 0.44–1.00)
GFR, Estimated: 42 mL/min — ABNORMAL LOW (ref >60)
Glucose, Bld: 242 mg/dL — ABNORMAL HIGH (ref 70–99)
Potassium: 3.9 mmol/L (ref 3.5–5.1)
Sodium: 136 mmol/L (ref 135–145)

## 2021-02-11 SURGERY — RIGHT/LEFT HEART CATH AND CORONARY/GRAFT ANGIOGRAPHY
Anesthesia: LOCAL

## 2021-02-11 MED ORDER — ENOXAPARIN SODIUM 30 MG/0.3ML ~~LOC~~ SOLN: 30.0000 mg | Freq: Two times a day (BID) | SUBCUTANEOUS | Status: DC

## 2021-02-11 MED ORDER — SODIUM CHLORIDE 0.9 % IV BOLUS
500.0000 mL | Freq: Once | INTRAVENOUS | Status: AC
  Administered 2021-02-11: 500 mL via INTRAVENOUS

## 2021-02-11 MED ORDER — ENOXAPARIN SODIUM 30 MG/0.3ML ~~LOC~~ SOLN
30.0000 mg | SUBCUTANEOUS | Status: DC
  Administered 2021-02-12: 30 mg via SUBCUTANEOUS
  Filled 2021-02-11: qty 0.3

## 2021-02-11 MED ORDER — PREDNISONE 50 MG PO TABS
60.0000 mg | ORAL_TABLET | Freq: Every day | ORAL | Status: DC
  Administered 2021-02-12: 60 mg via ORAL
  Filled 2021-02-11: qty 1

## 2021-02-11 NOTE — Progress Notes (Addendum)
Advanced Heart Failure Rounding Note  PCP-Cardiologist: No primary care provider on file.   Subjective:    H/o CAD s/p CABG in 01/2018, followed by PCI + DES to the ostial ramus in 02/2019. Admitted for CP reminiscent of previous angina. ? COPD flare. Hs trop low level and flat, 21>>30.    Hgb 16 Na 133 K 3.3 SCr 0.97 PCT <0.10   Denies any chest pain, reports her breathing has improved significantly, no longer wheezing.  Hypotensive overnight with dizziness, lightheadedness.     Objective:   Weight Range: 46.1 kg Body mass index is 20.53 kg/m.   Vital Signs:   Temp:  [97.8 F (36.6 C)-98.7 F (37.1 C)] 97.8 F (36.6 C) (03/04 0510) Pulse Rate:  [73-94] 86 (03/04 0510) Resp:  [16-20] 18 (03/04 0510) BP: (95-109)/(62-72) 103/67 (03/04 0510) SpO2:  [95 %-100 %] 100 % (03/04 0510) FiO2 (%):  [21 %] 21 % (03/04 0335) Weight:  [46.1 kg] 46.1 kg (03/04 0236)    Weight change: Filed Weights   02/10/21 0416 02/11/21 0236  Weight: 47.7 kg 46.1 kg    Intake/Output:   Intake/Output Summary (Last 24 hours) at 02/11/2021 0739 Last data filed at 02/11/2021 0534 Gross per 24 hour  Intake 1433.13 ml  Output 1325 ml  Net 108.13 ml      Physical Exam    Cardiac: JVD flat, normal rate and rhythm, clear s1 and s2, no murmurs, rubs or gallops, no LE edema Pulmonary: CTAB, not in distress Abdominal: non distended abdomen, soft and nontender Psych: Alert, conversant, in good spirits  Telemetry   NSR 90s   EKG    No new EKG to review   Labs    CBC Recent Labs    02/10/21 0030 02/11/21 0436  WBC 5.8 5.7  HGB 15.9* 16.8*  HCT 47.2* 53.2*  MCV 91.8 95.3  PLT 177 151   Basic Metabolic Panel Recent Labs    02/10/21 0030 02/11/21 0436  NA 133* 136  K 3.3* 3.9  CL 97* 97*  CO2 23 24  GLUCOSE 106* 242*  BUN 13 21*  CREATININE 0.97 1.42*  CALCIUM 9.3 10.3   Liver Function Tests Recent Labs    02/09/21 1557  AST 25  ALT 16  ALKPHOS 94  BILITOT 1.1   PROT 7.7  ALBUMIN 4.3   No results for input(s): LIPASE, AMYLASE in the last 72 hours. Cardiac Enzymes No results for input(s): CKTOTAL, CKMB, CKMBINDEX, TROPONINI in the last 72 hours.  BNP: BNP (last 3 results) Recent Labs    05/21/20 1102 10/20/20 1702  BNP 896.5* 1,118.4*    ProBNP (last 3 results) No results for input(s): PROBNP in the last 8760 hours.   D-Dimer No results for input(s): DDIMER in the last 72 hours. Hemoglobin A1C No results for input(s): HGBA1C in the last 72 hours. Fasting Lipid Panel No results for input(s): CHOL, HDL, LDLCALC, TRIG, CHOLHDL, LDLDIRECT in the last 72 hours. Thyroid Function Tests Recent Labs    02/09/21 1557  TSH 0.765    Other results:   Imaging    DG Chest 2 View  Result Date: 02/10/2021 CLINICAL DATA:  Shortness of breath and chest pain for couple days, history lung cancer, asthma, CHF, hypertension, diabetes mellitus, coronary artery disease post CABG, atrial fibrillation, COPD EXAM: CHEST - 2 VIEW COMPARISON:  10/20/2020 FINDINGS: LEFT subclavian ICD with leads projecting at RIGHT ventricle. Borderline enlargement of cardiac silhouette post CABG. Mediastinal contours and  pulmonary vascularity normal. Lungs appear emphysematous with minimal RIGHT pleural effusion and bibasilar atelectasis. Remaining lungs clear. No pneumothorax or acute osseous findings. IMPRESSION: COPD changes with minimal bibasilar atelectasis and minimal RIGHT pleural effusion. Borderline enlargement of cardiac silhouette post CABG. Electronically Signed   By: Lavonia Dana M.D.   On: 02/10/2021 08:42   ECHOCARDIOGRAM COMPLETE  Result Date: 02/10/2021    ECHOCARDIOGRAM REPORT   Patient Name:   Brittany [name] Date of Exam: 02/10/2021 Medical Rec #:  [id number]               Height:       59.0 in Accession #:    [id number]              Weight:       105.2 lb Date of Birth:  06-30-1961               BSA:          1.403 m Patient Age:    60 years                 BP:           109/72 mmHg Patient Gender: F                       HR:           94 bpm. Exam Location:  Inpatient Procedure: 2D Echo, Cardiac Doppler and Color Doppler Indications:    CHF  History:        Patient has prior history of Echocardiogram examinations, most                 recent 10/21/2020. CHF, CAD and Previous Myocardial Infarction,                 Prior CABG, COPD, Arrythmias:Atrial Fibrillation; Risk                 Factors:Hypertension. Lung cancer, chemo,.  Sonographer:    Dustin Flock Referring Phys: [phone] AMY D CLEGG IMPRESSIONS  1. Left ventricular ejection fraction, by estimation, is <20%. The left ventricle has severely decreased function. The left ventricle demonstrates severe global hypokinesis with areas of akinesis detailed below. The left ventricular internal cavity size  was moderately dilated. Left ventricular diastolic parameters are consistent with Grade II diastolic dysfunction (pseudonormalization). Elevated left atrial pressure.  2. Right ventricular systolic function is normal. The right ventricular size is normal. There is mildly elevated pulmonary artery systolic pressure.  3. Left atrial size was moderately dilated.  4. The mitral valve is degenerative. Mild mitral valve regurgitation.  5. The aortic valve is tricuspid. There is mild calcification of the aortic valve. There is mild thickening of the aortic valve. Aortic valve regurgitation is moderate.  6. The inferior vena cava is normal in size with greater than 50% respiratory variability, suggesting right atrial pressure of 3 mmHg. Comparison(s): Compared to prior study on 10/2020, the EF dropped slightly to <20%. Otherwise, no significant change. FINDINGS  Left Ventricle: Left ventricular ejection fraction, by estimation, is <20%. The left ventricle has severely decreased function. The left ventricle demonstrates regional wall motion abnormalities. All anteroseptal, mid-to-apical inferoseptal, mid-to-apical  anterolateral, mid-to-apical anterior, apical inferior LV segments and apex are akinetic. The rest of the LV segments are severely hypokinetic. The left ventricular internal cavity size was moderately dilated. There is no left ventricular hypertrophy. Left ventricular diastolic parameters are consistent with Grade II diastolic dysfunction (  pseudonormalization). Elevated left atrial pressure. The E/e' is 20. Right Ventricle: The right ventricular size is normal. No increase in right ventricular wall thickness. Right ventricular systolic function is normal. There is mildly elevated pulmonary artery systolic pressure. The tricuspid regurgitant velocity is 3.08  m/s, and with an assumed right atrial pressure of 3 mmHg, the estimated right ventricular systolic pressure is 18.2 mmHg. Left Atrium: Left atrial size was moderately dilated. Right Atrium: Right atrial size was normal in size. Pericardium: There is no evidence of pericardial effusion. Mitral Valve: The mitral valve is degenerative in appearance. There is moderate thickening of the mitral valve leaflet(s). There is mild calcification of the mitral valve leaflet(s). Mild mitral annular calcification. Mild mitral valve regurgitation. Tricuspid Valve: The tricuspid valve is normal in structure. Tricuspid valve regurgitation is trivial. Aortic Valve: The aortic valve is tricuspid. There is mild calcification of the aortic valve. There is mild thickening of the aortic valve. Aortic valve regurgitation is moderate. Aortic regurgitation PHT measures 231 msec. Pulmonic Valve: The pulmonic valve was not well visualized. Pulmonic valve regurgitation is not visualized. Aorta: The aortic root is normal in size and structure. Venous: The inferior vena cava is normal in size with greater than 50% respiratory variability, suggesting right atrial pressure of 3 mmHg. IAS/Shunts: No atrial level shunt detected by color flow Doppler. Additional Comments: A device lead is  visualized.  LEFT VENTRICLE PLAX 2D LVIDd:         5.90 cm      Diastology LVIDs:         5.40 cm      LV e' medial:    3.37 cm/s LV PW:         1.10 cm      LV E/e' medial:  21.1 LV IVS:        0.70 cm      LV e' lateral:   3.70 cm/s LVOT diam:     2.30 cm      LV E/e' lateral: 19.2 LV SV:         65 LV SV Index:   46 LVOT Area:     4.15 cm  LV Volumes (MOD) LV vol d, MOD A4C: 177.5 ml LV vol s, MOD A4C: 126.0 ml LV SV MOD A4C:     177.5 ml RIGHT VENTRICLE RV Basal diam:  3.00 cm RV S prime:     6.64 cm/s TAPSE (M-mode): 2.2 cm LEFT ATRIUM             Index       RIGHT ATRIUM          Index LA diam:        4.00 cm 2.85 cm/m  RA Area:     9.37 cm LA Vol (A2C):   48.7 ml 34.70 ml/m RA Volume:   17.30 ml 12.33 ml/m LA Vol (A4C):   54.0 ml 38.48 ml/m LA Biplane Vol: 54.5 ml 38.83 ml/m  AORTIC VALVE LVOT Vmax:   92.00 cm/s LVOT Vmean:  58.600 cm/s LVOT VTI:    0.156 m AI PHT:      231 msec  AORTA Ao Root diam: 2.70 cm MITRAL VALVE               TRICUSPID VALVE MV Area (PHT): 7.44 cm    TR Peak grad:   37.9 mmHg MV Decel Time: 102 msec    TR Vmax:        308.00 cm/s MV E velocity:  71.10 cm/s MV A velocity: 39.40 cm/s  SHUNTS MV E/A ratio:  1.80        Systemic VTI:  0.16 m                            Systemic Diam: 2.30 cm Gwyndolyn Kaufman MD Electronically signed by Gwyndolyn Kaufman MD Signature Date/Time: 02/10/2021/12:34:13 PM    Final      Medications:     Scheduled Medications: . albuterol  2.5 mg Inhalation Q6H  . aspirin EC  81 mg Oral Daily  . atorvastatin  80 mg Oral q1800  . doxycycline  100 mg Oral Q12H  . enoxaparin (LOVENOX) injection  50 mg Subcutaneous Q12H  . ivabradine  7.5 mg Oral BID WC  . methylPREDNISolone (SOLU-MEDROL) injection  60 mg Intravenous Q6H  . sodium chloride flush  3 mL Intravenous Q12H  . spironolactone  12.5 mg Oral QHS    Infusions: . sodium chloride    . sodium chloride 10 mL/hr at 02/11/21 0534    PRN Medications: sodium chloride, acetaminophen,  ALPRAZolam, ipratropium-albuterol, nitroGLYCERIN, ondansetron (ZOFRAN) IV, sodium chloride flush     Assessment/Plan    1. Chest pain, unstable angina  - Hx of NSTEMI/STEMI s/p Emergent CABG 02/03/18: Cath with severe 2v CAD as above with severe LV dysfunction/ICM. s/p Emergent CABG 02/03/18. - Had NSTEMI and now s/p LHC 02/12/19 with DES to the ostial ramus extending into the distal left main - Brilinta stopped at recent discharge due to bleeding. - On ASA 81 + atorva 80 + SL nitro PRN - Hs trop flat and low level, 21>>30>>20>>16.  -Denies further chest pain after improvement in breathing, cancel LHC    2. Dyspnea, suspected acute exacerbation COPD - Presented with Ronchi  + wheezing on lung exam.  - Breathing has significantly improved - Schedule albuterol nebs q6 and PRN duonebs available - received solumedrol 60 mg q6H x 4 doses, last dose this am can switch back over to prednisone - Doxycline 100 mg bid x 5 days   3.ChronicSystolic HF due to ICM. S/p Medtronic single chamber ICD. -Echo 09/04/18 ([facility]) LVEF 20%, Moderate AI, Mild MR, Mild TR, Severe LAE, RV mildly decreased.  - Echo 02/12/19: EF 25-30%, RV mildly reduced - Echo 11/21 EF 20-25% RV mild HK. - NYHA III, chronic - Restart Spiro 12.5 qhs  - Continue corlanor 7.5 mg bid. - Continue Farxiga 10 mg daily. - No  blocker w/ likely acute COPDE  - has ICD. No VT/VF -Dry on exam today with AKI hold diuretic give back 500cc IVF  4. H/o Recurrent pleural effusionss/p pleurodesis - cxr with minimal right pleural effusion otherwise clear  5. Acute on Chronic hypoxic respiratory failure - Multifactorial, suspect COPD predominant  - Wears O2 PRN at home.  - continue COPD therapies as above, hold diuretics for today with AKI - She is followed by pulmonology.   6. AKI -Cr 1.42 from 0.9 yesterday -hold diuretic give back 500cc IVF  7. PAF - CHA2DS2/VASc is at least 4. (CHF, Vasc disease, HTN, Female).  - She has  history of Afib RVR in the past in the setting of her Lung CA. Previously on Xarelto but stopped.  - On ASA. - NSR on tele   8. 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  - Chest CT 02/19/18 with mass-like consolidation in R hilum concerning for recurrent tumor.  - Repeat CT 03/27/2018 -  No definite findings of locally recurrent tumor in the right lung. 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 Continued chest CT surveillance is advised in 3-6 months. - S/p thoracentesis 04/2018. Cytology negative for malignancy.  - CTA 11/19 stable scarring in the right hilum consistent with emphysema. Bronchoscopy on 11/15, no obvious source of bleeding    Length of Stay: 2  Katherine Roan, MD  02/11/2021, 7:39 AM  Advanced Heart Failure Team Pager [phone] (M-F; 7a - 4p)  Please contact [facility] for night-coverage after hours (4p -7a ) and weekends on [email or web address]  Patient seen and examined with the above-signed Advanced Practice Provider and/or Housestaff. I personally reviewed laboratory data, imaging studies and relevant notes. I independently examined the patient and formulated the important aspects of the plan. I have edited the note to reflect any of my changes or salient points. I have personally discussed the plan with the patient and/or family.  Breathing much better. No further CP. Trop flat. BP dropped and creatinine up slightly after lasix. No bleeding on lovenox  General:  Sitting up in chair. No resp difficulty HEENT: normal Neck: supple. no JVD. Carotids 2+ bilat; no bruits. No lymphadenopathy or thryomegaly appreciated. Cor: PMI nondisplaced. Regular rate & rhythm. No rubs, gallops or murmurs. Lungs: clear Abdomen: soft, nontender, nondistended. No hepatosplenomegaly. No bruits or masses. Good bowel sounds. Extremities: no cyanosis, clubbing, rash, edema Neuro: alert & orientedx3, cranial  nerves grossly intact. moves all 4 extremities w/o difficulty. Affect pleasant  Respiratory status much improved after treatment for COPD flare. Creatinine up slightly after diuresis. Suspect she is dry.   Will stop lasix. Give IVF. Hold cath today. Continue treatment for COPD flare. Can reassess over the weekend. If remains asymptomatic can go home. If more CP then will need cath on Monday. Change lovenox to DVT prophylaxis dosing.   Glori Bickers, MD  12:38 PM

## 2021-02-12 LAB — CBC
HCT: 45.9 % (ref 36.0–46.0)
Hemoglobin: 14.6 g/dL (ref 12.0–15.0)
MCH: 30.4 pg (ref 26.0–34.0)
MCHC: 31.8 g/dL (ref 30.0–36.0)
MCV: 95.6 fL (ref 80.0–100.0)
Platelets: 150 10*3/uL (ref 150–400)
RBC: 4.8 MIL/uL (ref 3.87–5.11)
RDW: 19 % — ABNORMAL HIGH (ref 11.5–15.5)
WBC: 11.5 10*3/uL — ABNORMAL HIGH (ref 4.0–10.5)
nRBC: 0 % (ref 0.0–0.2)

## 2021-02-12 LAB — BASIC METABOLIC PANEL
Anion gap: 11 (ref 5–15)
BUN: 33 mg/dL — ABNORMAL HIGH (ref 6–20)
CO2: 24 mmol/L (ref 22–32)
Calcium: 10.5 mg/dL — ABNORMAL HIGH (ref 8.9–10.3)
Chloride: 100 mmol/L (ref 98–111)
Creatinine, Ser: 1.12 mg/dL — ABNORMAL HIGH (ref 0.44–1.00)
GFR, Estimated: 56 mL/min — ABNORMAL LOW (ref >60)
Glucose, Bld: 155 mg/dL — ABNORMAL HIGH (ref 70–99)
Potassium: 3.7 mmol/L (ref 3.5–5.1)
Sodium: 135 mmol/L (ref 135–145)

## 2021-02-12 MED ORDER — DOXYCYCLINE HYCLATE 100 MG PO TABS: 100.0000 mg | ORAL_TABLET | Freq: Two times a day (BID) | ORAL | 0 refills | Status: DC

## 2021-02-12 MED ORDER — PREDNISONE 10 MG PO TABS: ORAL_TABLET | ORAL | 0 refills | Status: DC

## 2021-02-12 NOTE — Progress Notes (Signed)
Advanced Heart Failure Rounding Note  PCP-Cardiologist: No primary care provider on file.   Subjective:    Breathing much better with treatment of COPD flare. No further CP. Wants to go home   Creatinine improved with IVF 1.4-> 1.1  Objective:   Weight Range: 45.7 kg Body mass index is 20.35 kg/m.   Vital Signs:   Temp:  [98.1 F (36.7 C)-98.4 F (36.9 C)] 98.3 F (36.8 C) (03/05 0745) Pulse Rate:  [82-95] 90 (03/05 0745) Resp:  [14-20] 16 (03/05 0745) BP: (101-126)/(61-100) 101/62 (03/05 0745) SpO2:  [92 %-100 %] 98 % (03/05 0745) FiO2 (%):  [21 %] 21 % (03/04 2126) Weight:  [45.7 kg] 45.7 kg (03/05 0029) Last BM Date: 02/10/21  Weight change: Filed Weights   02/10/21 0416 02/11/21 0236 02/12/21 0029  Weight: 47.7 kg 46.1 kg 45.7 kg    Intake/Output:   Intake/Output Summary (Last 24 hours) at 02/12/2021 1150 Last data filed at 02/12/2021 0906 Gross per 24 hour  Intake 1000 ml  Output 1000 ml  Net 0 ml      Physical Exam    General:  Well appearing. No resp difficulty HEENT: normal Neck: supple. no JVD. Carotids 2+ bilat; no bruits. No lymphadenopathy or thryomegaly appreciated. Cor: PMI nondisplaced. Regular rate & rhythm. No rubs, gallops or murmurs. Lungs: clear Abdomen: soft, nontender, nondistended. No hepatosplenomegaly. No bruits or masses. Good bowel sounds. Extremities: no cyanosis, clubbing, rash, edema Neuro: alert & orientedx3, cranial nerves grossly intact. moves all 4 extremities w/o difficulty. Affect pleasant   Telemetry   Sinus 80-90s Personally reviewed  Labs    CBC Recent Labs    02/11/21 0436 02/12/21 0358  WBC 5.7 11.5*  HGB 16.8* 14.6  HCT 53.2* 45.9  MCV 95.3 95.6  PLT 172 324   Basic Metabolic Panel Recent Labs    02/11/21 0436 02/12/21 0358  NA 136 135  K 3.9 3.7  CL 97* 100  CO2 24 24  GLUCOSE 242* 155*  BUN 21* 33*  CREATININE 1.42* 1.12*  CALCIUM 10.3 10.5*   Liver Function Tests Recent Labs     02/09/21 1557  AST 25  ALT 16  ALKPHOS 94  BILITOT 1.1  PROT 7.7  ALBUMIN 4.3   No results for input(s): LIPASE, AMYLASE in the last 72 hours. Cardiac Enzymes No results for input(s): CKTOTAL, CKMB, CKMBINDEX, TROPONINI in the last 72 hours.  BNP: BNP (last 3 results) Recent Labs    05/21/20 1102 10/20/20 1702  BNP 896.5* 1,118.4*    ProBNP (last 3 results) No results for input(s): PROBNP in the last 8760 hours.   D-Dimer No results for input(s): DDIMER in the last 72 hours. Hemoglobin A1C No results for input(s): HGBA1C in the last 72 hours. Fasting Lipid Panel No results for input(s): CHOL, HDL, LDLCALC, TRIG, CHOLHDL, LDLDIRECT in the last 72 hours. Thyroid Function Tests Recent Labs    02/09/21 1557  TSH 0.765    Other results:   Imaging    No results found.   Medications:     Scheduled Medications: . albuterol  2.5 mg Inhalation Q6H  . aspirin EC  81 mg Oral Daily  . atorvastatin  80 mg Oral q1800  . doxycycline  100 mg Oral Q12H  . enoxaparin (LOVENOX) injection  30 mg Subcutaneous Q24H  . ivabradine  7.5 mg Oral BID WC  . predniSONE  60 mg Oral Q breakfast  . sodium chloride flush  3 mL  Intravenous Q12H  . spironolactone  12.5 mg Oral QHS    Infusions: . sodium chloride    . sodium chloride 10 mL/hr at 02/11/21 0534    PRN Medications: sodium chloride, acetaminophen, ALPRAZolam, ipratropium-albuterol, nitroGLYCERIN, ondansetron (ZOFRAN) IV, sodium chloride flush     Assessment/Plan    1. Chest pain, unstable angina  - Hx of NSTEMI/STEMI s/p Emergent CABG 02/03/18: Cath with severe 2v CAD as above with severe LV dysfunction/ICM. s/p Emergent CABG 02/03/18. - Had NSTEMI and now s/p LHC 02/12/19 with DES to the ostial ramus extending into the distal left main - Brilinta stopped at recent discharge due to bleeding. - On ASA 81 + atorva 80 + SL nitro PRN - Hs trop flat and low level, 21>>30>>20>>16.  -Denies further chest pain after  treatment of COPD flare  2. Dyspnea, suspected acute exacerbation COPD - Presented with Ronchi  + wheezing on lung exam.  - Breathing has significantly improved with steroids and  albuterol nebs q6 and PRN duonebs available - received solumedrol 60 mg q6H x 4 doses -> prednisone - Doxycline 100 mg bid x 5 days   3.ChronicSystolic HF due to ICM. S/p Medtronic single chamber ICD. -Echo 09/04/18 ([facility]) LVEF 20%, Moderate AI, Mild MR, Mild TR, Severe LAE, RV mildly decreased.  - Echo 02/12/19: EF 25-30%, RV mildly reduced - Echo 11/21 EF 20-25% RV mild HK. - NYHA III, chronic - Continue Spiro 12.5 qhs  - Continue corlanor 7.5 mg bid. - Continue Farxiga 10 mg daily. - No  blocker w/ likely acute COPDE  - has ICD. No VT/VF - developed aki after one dose IV lasix. creatinine improved with IVF  4. H/o Recurrent pleural effusionss/p pleurodesis - cxr with minimal right pleural effusion otherwise clear  5. Acute on Chronic hypoxic respiratory failure - Multifactorial, suspect COPD predominant  - Wears O2 PRN at home.  - continue COPD therapies as above, - She is followed by pulmonology.   6. AKI - Resolved with IVF   7. PAF - CHA2DS2/VASc is at least 4. (CHF, Vasc disease, HTN, Female).  - She has history of Afib RVR in the past in the setting of her Lung CA. Previously on Xarelto but stopped.  - On ASA. - NSR on tele   8. 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  - Chest CT 02/19/18 with mass-like consolidation in R hilum concerning for recurrent tumor.  - Repeat CT 03/27/2018 -No definite findings of locally recurrent tumor in the right lung. 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 Continued chest CT surveillance is advised in 3-6 months. - S/p thoracentesis 04/2018. Cytology negative for malignancy.  - CTA 11/19 stable scarring in the right hilum consistent with emphysema. Bronchoscopy  on 11/15, no obvious source of bleeding   Suspect main issue was COPD flare. Hstrop not c/w ACS. No further chest tightness after treatment of COPD flare. She wants to go home today. I think that is fine.   Would send home on all previous meds. Give steroid taper  Prednisone 40 x 2 days, 30 x 2 days, 20 x 2 days, 10x2 days Continue doxy 100 bid for total of 5 days.   F/u in clinic in next 2 weeks.    Length of Stay: 3  Glori Bickers, MD  02/12/2021, 11:50 AM  Advanced Heart Failure Team Pager [phone] (M-F; 7a - 4p)  Please contact [facility] for night-coverage  after hours (4p -7a ) and weekends on [email or web address]

## 2021-02-12 NOTE — Discharge Instructions (Signed)
Prednisone taper instructions: 02/13/21 - take 4 tablets (40mg ) daily x2 days 02/15/21 - take 3 tablets (30mg ) daily x2 days 02/17/21 - take 2 tablets (20mg ) daily x2 days 02/19/21 - take 1 tablet (10mg ) daily x2 days then stop   Please hold your torsemide through the weekend and restart as previously scheduled on 02/14/21   Heart Failure Education: 1. Weigh yourself EVERY morning after you go to the bathroom but before you eat or drink anything. Write this number down in a weight log/diary. If you gain 3 pounds overnight or 5 pounds in a week, call the office. 2. Take your medicines as prescribed. If you have concerns about your medications, please call us before you stop taking them.  3. Eat low salt foods--Limit salt (sodium) to 2000 mg per day. This will help prevent your body from holding onto fluid. Read food labels as many processed foods have a lot of sodium, especially canned goods and prepackaged meats. If you would like some assistance choosing low sodium foods, we would be happy to set you up with a nutritionist. 4. Stay as active as you can everyday. Staying active will give you more energy and make your muscles stronger. Start with 5 minutes at a time and work your way up to 30 minutes a day. Break up your activities--do some in the morning and some in the afternoon. Start with 3 days per week and work your way up to 5 days as you can.  If you have chest pain, feel short of breath, dizzy, or lightheaded, STOP. If you don't feel better after a short rest, call 911. If you do feel better, call the office to let us know you have symptoms with exercise. 5. Limit all fluids for the day to less than 2 liters. Fluid includes all drinks, coffee, juice, ice chips, soup, jello, and all other liquids.

## 2021-02-12 NOTE — Discharge Summary (Addendum)
Discharge Summary    Patient ID: Brittany [name] MRN: [id number]; DOB: Jan 29, 1961  Admit date: 02/09/2021 Discharge date: 02/12/2021  PCP:  Scifres, Dorothy, Billings  Cardiologist:  Glori Bickers, MD Advanced Practice Provider:  No care team member to display Electrophysiologist:  None   Discharge Diagnoses    1. Acute exacerbation of COPD 2. Chest pain ruled out for ACS 3. CAD s/p CABG 4. Chronic systolic HF without acute exacerbation 5. AKI due to overdiuresis 6. Hypokalemia  Diagnostic Studies/Procedures    Echocardiogram 02/10/21: 1. Left ventricular ejection fraction, by estimation, is <20%. The left  ventricle has severely decreased function. The left ventricle demonstrates  severe global hypokinesis with areas of akinesis detailed below. The left  ventricular internal cavity size   was moderately dilated. Left ventricular diastolic parameters are  consistent with Grade II diastolic dysfunction (pseudonormalization).  Elevated left atrial pressure.   2. Right ventricular systolic function is normal. The right ventricular  size is normal. There is mildly elevated pulmonary artery systolic  pressure.   3. Left atrial size was moderately dilated.   4. The mitral valve is degenerative. Mild mitral valve regurgitation.   5. The aortic valve is tricuspid. There is mild calcification of the  aortic valve. There is mild thickening of the aortic valve. Aortic valve  regurgitation is moderate.   6. The inferior vena cava is normal in size with greater than 50%  respiratory variability, suggesting right atrial pressure of 3 mmHg.   Comparison(s): Compared to prior study on 10/2020, the EF dropped slightly  to <20%. Otherwise, no significant change.  _____________   History of Present Illness     Brittany [name] is a 60 y.o. female with a PMH of CAD s/p CABG in 2019 with subsequent PCI/DES to ostial ramus, chronic combined  CHF/ICM s/p ICD, paroxysmal atrial fibrillation, HTN, HLD, COPD, and history of SCLC. She was seen outpatient 02/09/21 with complaints of chest pain and SOB for several days. Symptoms were most c/w COPD flare, however somewhat reminiscent of previous ACS, therefore was admitted to the [facility] for further evaluation.   Hospital Course     Consultants: None   1. Chest pain in patient with CAD s/p CABG in 2019 with subsequent PCI/DES to ostial ramus 02/2019: patient experienced chest pain reminiscent of prior angina. EKG non-ischemic. Hstrop 21>30>20>16 - trend not c/w ACS. Echo this admission with EF <20% with G2DD, normal RV size/function, mildly elevated PA pressures, moderate LAE, mild MR, and moderate AI. Her symptoms improved with management of her COPD exacerbation. Ischemic evaluation not pursued this admission. Previously on brilinta, however this was discontinued due to bleeding complication. BBlocker deferred due to underlying lung disease - Continue aspirin and statin  2. COPD exacerbation: patient with rhonchi and wheezing on lung exam. Symptoms significantly improved with IV>po steroids and nebulizers. She was started on doxycycline to complete a 5 day course - Continue doxycycline to complete a 5 day course (2.5 days left) - Continue prednisone taper 40 x 2 days, 30 x 2 days, 20 x 2 days, 10 x 2 days, then stop - Continue home inhaler  3. Chronic combined CHF/ICM: Echo this admission with EF <20% with G2DD, normal RV size/function, mildly elevated PA pressures, moderate LAE, mild MR, and moderate AI.Trial of IV diuresis resulted in bump in Cr. Then given IVF with improvement in Cr. GDMT has been limited by COPD and hypotension. Weight on the day of  discharge was 100.7lbs.  - Continue corlanor and farxiga - Patient instructed to hold torsemide through the weekend and restart 02/14/21 as previously prescribed. She takes prn metolazone as well.   4. Paroxysmal atrial  fibrillation: maintained sinus rhythm this admission. No on anticoagulation due to bleeding issues in the past. Not on BBlocker due to underlying COPD - Continue aspirin - Continue corlanor  6. AKI: Cr bumped to 1.42 with efforts to diurese which improved to 1.12 with IVF's on the day of discharge - Continue to monitor closely outpatient   Did the patient have an acute coronary syndrome (MI, NSTEMI, STEMI, etc) this admission?:  No                               Did the patient have a percutaneous coronary intervention (stent / angioplasty)?:  No.       _____________  Discharge Vitals Blood pressure 101/62, pulse 90, temperature 98.3 F (36.8 C), temperature source Oral, resp. rate 16, weight 45.7 kg, SpO2 98 %.  Filed Weights   02/10/21 0416 02/11/21 0236 02/12/21 0029  Weight: 47.7 kg 46.1 kg 45.7 kg    Labs & Radiologic Studies    CBC Recent Labs    02/11/21 0436 02/12/21 0358  WBC 5.7 11.5*  HGB 16.8* 14.6  HCT 53.2* 45.9  MCV 95.3 95.6  PLT 172 166   Basic Metabolic Panel Recent Labs    02/11/21 0436 02/12/21 0358  NA 136 135  K 3.9 3.7  CL 97* 100  CO2 24 24  GLUCOSE 242* 155*  BUN 21* 33*  CREATININE 1.42* 1.12*  CALCIUM 10.3 10.5*   Liver Function Tests Recent Labs    02/09/21 1557  AST 25  ALT 16  ALKPHOS 94  BILITOT 1.1  PROT 7.7  ALBUMIN 4.3   No results for input(s): LIPASE, AMYLASE in the last 72 hours. High Sensitivity Troponin:   Recent Labs  Lab 02/09/21 1557 02/10/21 0030 02/10/21 1205 02/10/21 1444  TROPONINIHS 21* 30* 20* 16    BNP Invalid input(s): POCBNP D-Dimer No results for input(s): DDIMER in the last 72 hours. Hemoglobin A1C No results for input(s): HGBA1C in the last 72 hours. Fasting Lipid Panel No results for input(s): CHOL, HDL, LDLCALC, TRIG, CHOLHDL, LDLDIRECT in the last 72 hours. Thyroid Function Tests Recent Labs    02/09/21 1557  TSH 0.765   _____________  DG Chest 2 View  Result Date:  02/10/2021 CLINICAL DATA:  Shortness of breath and chest pain for couple days, history lung cancer, asthma, CHF, hypertension, diabetes mellitus, coronary artery disease post CABG, atrial fibrillation, COPD EXAM: CHEST - 2 VIEW COMPARISON:  10/20/2020 FINDINGS: LEFT subclavian ICD with leads projecting at RIGHT ventricle. Borderline enlargement of cardiac silhouette post CABG. Mediastinal contours and pulmonary vascularity normal. Lungs appear emphysematous with minimal RIGHT pleural effusion and bibasilar atelectasis. Remaining lungs clear. No pneumothorax or acute osseous findings. IMPRESSION: COPD changes with minimal bibasilar atelectasis and minimal RIGHT pleural effusion. Borderline enlargement of cardiac silhouette post CABG. Electronically Signed   By: Lavonia Dana M.D.   On: 02/10/2021 08:42   ECHOCARDIOGRAM COMPLETE  Result Date: 02/10/2021    ECHOCARDIOGRAM REPORT   Patient Name:   Brittany [name] Date of Exam: 02/10/2021 Medical Rec #:  [id number]               Height:       59.0 in Accession #:  [id number]              Weight:       105.2 lb Date of Birth:  [birth year]               BSA:          1.403 m Patient Age:    58 years                BP:           109/72 mmHg Patient Gender: F                       HR:           94 bpm. Exam Location:  Inpatient Procedure: 2D Echo, Cardiac Doppler and Color Doppler Indications:    CHF  History:        Patient has prior history of Echocardiogram examinations, most                 recent 10/21/2020. CHF, CAD and Previous Myocardial Infarction,                 Prior CABG, COPD, Arrythmias:Atrial Fibrillation; Risk                 Factors:Hypertension. Lung cancer, chemo,.  Sonographer:    Dustin Flock Referring Phys: [phone] AMY D CLEGG IMPRESSIONS  1. Left ventricular ejection fraction, by estimation, is <20%. The left ventricle has severely decreased function. The left ventricle demonstrates severe global hypokinesis with areas of akinesis detailed  below. The left ventricular internal cavity size  was moderately dilated. Left ventricular diastolic parameters are consistent with Grade II diastolic dysfunction (pseudonormalization). Elevated left atrial pressure.  2. Right ventricular systolic function is normal. The right ventricular size is normal. There is mildly elevated pulmonary artery systolic pressure.  3. Left atrial size was moderately dilated.  4. The mitral valve is degenerative. Mild mitral valve regurgitation.  5. The aortic valve is tricuspid. There is mild calcification of the aortic valve. There is mild thickening of the aortic valve. Aortic valve regurgitation is moderate.  6. The inferior vena cava is normal in size with greater than 50% respiratory variability, suggesting right atrial pressure of 3 mmHg. Comparison(s): Compared to prior study on 10/2020, the EF dropped slightly to <20%. Otherwise, no significant change. FINDINGS  Left Ventricle: Left ventricular ejection fraction, by estimation, is <20%. The left ventricle has severely decreased function. The left ventricle demonstrates regional wall motion abnormalities. All anteroseptal, mid-to-apical inferoseptal, mid-to-apical anterolateral, mid-to-apical anterior, apical inferior LV segments and apex are akinetic. The rest of the LV segments are severely hypokinetic. The left ventricular internal cavity size was moderately dilated. There is no left ventricular hypertrophy. Left ventricular diastolic parameters are consistent with Grade II diastolic dysfunction (pseudonormalization). Elevated left atrial pressure. The E/e' is 20. Right Ventricle: The right ventricular size is normal. No increase in right ventricular wall thickness. Right ventricular systolic function is normal. There is mildly elevated pulmonary artery systolic pressure. The tricuspid regurgitant velocity is 3.08  m/s, and with an assumed right atrial pressure of 3 mmHg, the estimated right ventricular systolic pressure  is 18.8 mmHg. Left Atrium: Left atrial size was moderately dilated. Right Atrium: Right atrial size was normal in size. Pericardium: There is no evidence of pericardial effusion. Mitral Valve: The mitral valve is degenerative in appearance. There is moderate thickening of the mitral valve leaflet(s). There is mild calcification of the  mitral valve leaflet(s). Mild mitral annular calcification. Mild mitral valve regurgitation. Tricuspid Valve: The tricuspid valve is normal in structure. Tricuspid valve regurgitation is trivial. Aortic Valve: The aortic valve is tricuspid. There is mild calcification of the aortic valve. There is mild thickening of the aortic valve. Aortic valve regurgitation is moderate. Aortic regurgitation PHT measures 231 msec. Pulmonic Valve: The pulmonic valve was not well visualized. Pulmonic valve regurgitation is not visualized. Aorta: The aortic root is normal in size and structure. Venous: The inferior vena cava is normal in size with greater than 50% respiratory variability, suggesting right atrial pressure of 3 mmHg. IAS/Shunts: No atrial level shunt detected by color flow Doppler. Additional Comments: A device lead is visualized.  LEFT VENTRICLE PLAX 2D LVIDd:         5.90 cm      Diastology LVIDs:         5.40 cm      LV e' medial:    3.37 cm/s LV PW:         1.10 cm      LV E/e' medial:  21.1 LV IVS:        0.70 cm      LV e' lateral:   3.70 cm/s LVOT diam:     2.30 cm      LV E/e' lateral: 19.2 LV SV:         65 LV SV Index:   46 LVOT Area:     4.15 cm  LV Volumes (MOD) LV vol d, MOD A4C: 177.5 ml LV vol s, MOD A4C: 126.0 ml LV SV MOD A4C:     177.5 ml RIGHT VENTRICLE RV Basal diam:  3.00 cm RV S prime:     6.64 cm/s TAPSE (M-mode): 2.2 cm LEFT ATRIUM             Index       RIGHT ATRIUM          Index LA diam:        4.00 cm 2.85 cm/m  RA Area:     9.37 cm LA Vol (A2C):   48.7 ml 34.70 ml/m RA Volume:   17.30 ml 12.33 ml/m LA Vol (A4C):   54.0 ml 38.48 ml/m LA Biplane Vol: 54.5  ml 38.83 ml/m  AORTIC VALVE LVOT Vmax:   92.00 cm/s LVOT Vmean:  58.600 cm/s LVOT VTI:    0.156 m AI PHT:      231 msec  AORTA Ao Root diam: 2.70 cm MITRAL VALVE               TRICUSPID VALVE MV Area (PHT): 7.44 cm    TR Peak grad:   37.9 mmHg MV Decel Time: 102 msec    TR Vmax:        308.00 cm/s MV E velocity: 71.10 cm/s MV A velocity: 39.40 cm/s  SHUNTS MV E/A ratio:  1.80        Systemic VTI:  0.16 m                            Systemic Diam: 2.30 cm Gwyndolyn Kaufman MD Electronically signed by Gwyndolyn Kaufman MD Signature Date/Time: 02/10/2021/12:34:13 PM    Final    Disposition   Pt is being discharged home today in good condition.  Follow-up Plans & Appointments     Follow-up Information     [facility] Follow up.  Specialty: Cardiology Why: Our office will contact you with an appointment date/time for post-hospital follow-up to be seen within 1-2 weeks.  Contact information: [address] [id number] [place] [phone]                  Discharge Medications   Allergies as of 02/12/2021       Reactions   Codeine Nausea And Vomiting        Medication List     TAKE these medications    albuterol 108 (90 Base) MCG/ACT inhaler Commonly known as: VENTOLIN HFA Inhale 2 puffs into the lungs every 6 (six) hours as needed for wheezing or shortness of breath.   ALPRAZolam 0.25 MG tablet Commonly known as: XANAX Take 1 tablet (0.25 mg total) by mouth daily as needed for anxiety.   aspirin EC 81 MG tablet Take 1 tablet (81 mg total) by mouth daily. Swallow whole.   atorvastatin 80 MG tablet Commonly known as: LIPITOR Take 1 tablet (80 mg total) by mouth daily at 6 PM. What changed: when to take this   dapagliflozin propanediol 10 MG Tabs tablet Commonly known as: Farxiga Take 1 tablet (10 mg total) by mouth daily before breakfast.   doxycycline 100 MG tablet Commonly known as:  VIBRA-TABS Take 1 tablet (100 mg total) by mouth every 12 (twelve) hours.   feeding supplement Liqd Take 237 mLs by mouth 3 (three) times daily between meals.   ipratropium-albuterol 0.5-2.5 (3) MG/3ML Soln Commonly known as: DUONEB Take 3 mLs by nebulization 2 (two) times daily as needed (for shortness of breath).   ivabradine 7.5 MG Tabs tablet Commonly known as: CORLANOR Take 1 tablet (7.5 mg total) by mouth 2 (two) times daily with a meal.   metolazone 2.5 MG tablet Commonly known as: ZAROXOLYN Take 1 tablet (2.5 mg total) by mouth as directed. Take today and tomorrow and then hold till directed What changed:   when to take this  reasons to take this   nitroGLYCERIN 0.4 MG SL tablet Commonly known as: NITROSTAT Place 1 tablet (0.4 mg total) under the tongue every 5 (five) minutes as needed for chest pain.   potassium chloride SA 20 MEQ tablet Commonly known as: KLOR-CON Take 2 tablets (40 mEq total) by mouth daily. Take 66meq (2 tablets) with metolazone doses   predniSONE 10 MG tablet Commonly known as: DELTASONE 02/13/21 - take 4 tablets (40mg ) daily x2 days; 02/15/21 - take 3 tablets (30mg ) daily x2 days; 02/17/21 - take 2 tablets (20mg ) daily x2 days; 02/19/21 - take 1 tablet (10mg ) daily x2 days; then stop   torsemide 20 MG tablet Commonly known as: DEMADEX Take 3 tablets (60 mg total) by mouth daily. Notes to patient: Restart this medication 02/14/21 as previously prescribed           Outstanding Labs/Studies   None  Duration of Discharge Encounter   Greater than 30 minutes including physician time.  Signed, Abigail Butts, PA-C 02/12/2021, 3:00 PM  Patient seen and examined with the above-signed Advanced Practice Provider and/or Housestaff. I personally reviewed laboratory data, imaging studies and relevant notes. I independently examined the patient and formulated the important aspects of the plan. I have edited the note to reflect any of my changes or  salient points. I have personally discussed the plan with the patient and/or family.  Much improved after treatment for COPD flare. Chest pain has resolved. Hstrop is normal. Patient wants to defer repeat cath  at this point. Agree with that plan. No evidence of HF decompensation. Will f/u in the office.   Glori Bickers, MD  5:53 PM

## 2021-02-14 ENCOUNTER — Ambulatory Visit (INDEPENDENT_AMBULATORY_CARE_PROVIDER_SITE_OTHER): Payer: BC Managed Care – PPO

## 2021-02-14 DIAGNOSIS — Z95810 Presence of automatic (implantable) cardiac defibrillator: Secondary | ICD-10-CM | POA: Diagnosis not present

## 2021-02-14 DIAGNOSIS — I5022 Chronic systolic (congestive) heart failure: Secondary | ICD-10-CM

## 2021-02-14 NOTE — Progress Notes (Signed)
EPIC Encounter for ICM Monitoring  Patient Name: Brittany [name] is a 60 y.o. female Date: 02/14/2021 Primary Care Physican: Maude Leriche, PA-C Primary Cardiologist:Bensimhon Electrophysiologist:Allred 02/14/2021 Weight: 100 lbs  Spoke with patient.  She restarted Torsemide 3/6 post hospitalization as instructed (was on hold upon discharge 3/5).  She has been instructed at time of discharge by Dr [name] to take 1 Metolzone dosage with extra Potassium and plan on taking tomorrow because she has not obtained the medication from the pharmacy since discharge.  She reports when she has fluid symptom it feels like a band around the chest under her breasts which she had that feeling yesterday.   OptivolThoracic impedancesuggestingpossible fluid accumulation starting 02/09/2021 which correlates with hospitalization.  Prescribed:   Torsemide20 mgTake3 tablets (60 mg total) daily.   Potassium 20 mEq take2 tablets by mouth daily. Take 2tablets (40 mEq total)with Metolzone  Metolazone 2.5 mg take as directed  Labs: 02/12/2021 Creatinine 1.12, BUN 33, Potassium 3.7, Sodium 135, GFR 56 02/11/2021 Creatinine 1.42, BUN 21, Potassium 3.9, Sodium 136, GFR 42  02/10/2021 Creatinine 0.97, BUN 13, Potassium 3.3, Sodium 133, GFR >60  02/09/2021 Creatinine 0.92, BUN 11, Potassium 3.3, Sodium 139, GFR >60  01/27/2021 Creatinine 0.95, BUN 13, Potassium 3.0, Sodium 139, GFR >60  A complete set of results can be found in Results Review. 11/10/2020 Creatinine 0.95, BUN 14, Potassium 2.9, Sodium 141, GFR >60 A complete set of results can be found in Results Review.  Recommendations:Pt plans to take Metolazone 1 dose 30 minutes before Torsemide with extra Potassium on 02/15/2021 as instructed by Dr [name].  Follow-up plan: ICM clinic phone appointment on3/10/2021 to recheck fluid levels. 91 day device clinic remote transmission4/26/2022.  EP/Cardiology Office Visits:  03/01/2021 with [facility].  Copy of ICM check sent to Dr.Allred and Dr [name] as Juluis Rainier regarding pt update since hospital discharge.  3 month ICM trend: 02/14/2021.    1 Year ICM trend:       [name], RN 02/14/2021 10:41 AM

## 2021-02-17 ENCOUNTER — Encounter (HOSPITAL_COMMUNITY): Payer: Medicare Other

## 2021-02-18 ENCOUNTER — Ambulatory Visit (INDEPENDENT_AMBULATORY_CARE_PROVIDER_SITE_OTHER): Payer: Medicare Other

## 2021-02-18 DIAGNOSIS — I5022 Chronic systolic (congestive) heart failure: Secondary | ICD-10-CM

## 2021-02-18 DIAGNOSIS — Z95810 Presence of automatic (implantable) cardiac defibrillator: Secondary | ICD-10-CM

## 2021-02-18 NOTE — Progress Notes (Signed)
EPIC Encounter for ICM Monitoring  Patient Name: Brittany [name] is a 60 y.o. female Date: 02/18/2021 Primary Care Physican: Maude Leriche, PA-C Primary Cardiologist:Bensimhon Electrophysiologist:Allred 3/7/2022Weight: 100lbs  Spoke with patient. She is feeling fine after taking Metolazone.  She stated she does not feel like she has any fluid at this time.   OptivolThoracic impedancesuggestingfluid level returned to normal and above baseline which is likely related to the use of Metolazone.  Prescribed:   Torsemide20 mgTake3 tablets (60 mg total) daily.   Potassium 20 mEq take2 tablets by mouth daily. Take 2tablets (40 mEq total)with Metolzone  Metolazone 2.5 mg take as directed  Labs: 02/12/2021 Creatinine 1.12, BUN 33, Potassium 3.7, Sodium 135, GFR 56 02/11/2021 Creatinine 1.42, BUN 21, Potassium 3.9, Sodium 136, GFR 42  02/10/2021 Creatinine 0.97, BUN 13, Potassium 3.3, Sodium 133, GFR >60  02/09/2021 Creatinine 0.92, BUN 11, Potassium 3.3, Sodium 139, GFR >60  01/27/2021 Creatinine 0.95, BUN 13, Potassium 3.0, Sodium 139, GFR >60  A complete set of results can be found in Results Review. 11/10/2020 Creatinine 0.95, BUN 14, Potassium 2.9, Sodium 141, GFR >60 A complete set of results can be found in Results Review.  Recommendations:Advised to keep fluid intake around 64 oz a day to help stay hydrated after taking Metolazone.  Follow-up plan: ICM clinic phone appointment on4/10/2021. 91 day device clinic remote transmission4/26/2022.  EP/Cardiology Office Visits: 03/01/2021 with [facility].  Copy of ICM check sent to Dr.Allred.   3 month ICM trend: 02/18/2021.    1 Year ICM trend:       [name], RN 02/18/2021 12:25 PM

## 2021-03-01 ENCOUNTER — Ambulatory Visit (HOSPITAL_COMMUNITY)
Admission: RE | Admit: 2021-03-01 | Discharge: 2021-03-01 | Disposition: A | Discharge status: Home / Self Care | Payer: BC Managed Care – PPO | Source: Ambulatory Visit | Attending: Adult Health | Admitting: Adult Health

## 2021-03-01 ENCOUNTER — Other Ambulatory Visit: Payer: Self-pay

## 2021-03-01 ENCOUNTER — Encounter (HOSPITAL_COMMUNITY): Payer: Self-pay

## 2021-03-01 VITALS — BP 120/76 | HR 75 | Wt 105.4 lb

## 2021-03-01 DIAGNOSIS — Z95810 Presence of automatic (implantable) cardiac defibrillator: Secondary | ICD-10-CM | POA: Insufficient documentation

## 2021-03-01 DIAGNOSIS — I252 Old myocardial infarction: Secondary | ICD-10-CM | POA: Diagnosis not present

## 2021-03-01 DIAGNOSIS — I255 Ischemic cardiomyopathy: Secondary | ICD-10-CM | POA: Diagnosis not present

## 2021-03-01 DIAGNOSIS — Z87891 Personal history of nicotine dependence: Secondary | ICD-10-CM | POA: Diagnosis not present

## 2021-03-01 DIAGNOSIS — J449 Chronic obstructive pulmonary disease, unspecified: Secondary | ICD-10-CM | POA: Insufficient documentation

## 2021-03-01 DIAGNOSIS — I2510 Atherosclerotic heart disease of native coronary artery without angina pectoris: Secondary | ICD-10-CM | POA: Diagnosis not present

## 2021-03-01 DIAGNOSIS — Z7982 Long term (current) use of aspirin: Secondary | ICD-10-CM | POA: Insufficient documentation

## 2021-03-01 DIAGNOSIS — Z79899 Other long term (current) drug therapy: Secondary | ICD-10-CM | POA: Diagnosis not present

## 2021-03-01 DIAGNOSIS — I110 Hypertensive heart disease with heart failure: Secondary | ICD-10-CM | POA: Diagnosis not present

## 2021-03-01 DIAGNOSIS — Z955 Presence of coronary angioplasty implant and graft: Secondary | ICD-10-CM | POA: Insufficient documentation

## 2021-03-01 DIAGNOSIS — Z85118 Personal history of other malignant neoplasm of bronchus and lung: Secondary | ICD-10-CM | POA: Diagnosis not present

## 2021-03-01 DIAGNOSIS — R0600 Dyspnea, unspecified: Secondary | ICD-10-CM

## 2021-03-01 DIAGNOSIS — Z923 Personal history of irradiation: Secondary | ICD-10-CM | POA: Insufficient documentation

## 2021-03-01 DIAGNOSIS — I480 Paroxysmal atrial fibrillation: Secondary | ICD-10-CM | POA: Diagnosis not present

## 2021-03-01 DIAGNOSIS — Z9221 Personal history of antineoplastic chemotherapy: Secondary | ICD-10-CM | POA: Diagnosis not present

## 2021-03-01 DIAGNOSIS — Z8249 Family history of ischemic heart disease and other diseases of the circulatory system: Secondary | ICD-10-CM | POA: Diagnosis not present

## 2021-03-01 DIAGNOSIS — I5022 Chronic systolic (congestive) heart failure: Secondary | ICD-10-CM | POA: Insufficient documentation

## 2021-03-01 DIAGNOSIS — Z7984 Long term (current) use of oral hypoglycemic drugs: Secondary | ICD-10-CM | POA: Diagnosis not present

## 2021-03-01 DIAGNOSIS — R0609 Other forms of dyspnea: Secondary | ICD-10-CM

## 2021-03-01 DIAGNOSIS — I5042 Chronic combined systolic (congestive) and diastolic (congestive) heart failure: Secondary | ICD-10-CM

## 2021-03-01 DIAGNOSIS — Z885 Allergy status to narcotic agent status: Secondary | ICD-10-CM | POA: Insufficient documentation

## 2021-03-01 DIAGNOSIS — Z951 Presence of aortocoronary bypass graft: Secondary | ICD-10-CM | POA: Diagnosis not present

## 2021-03-01 DIAGNOSIS — J9611 Chronic respiratory failure with hypoxia: Secondary | ICD-10-CM | POA: Diagnosis not present

## 2021-03-01 LAB — BASIC METABOLIC PANEL
Anion gap: 8 (ref 5–15)
BUN: 17 mg/dL (ref 6–20)
CO2: 37 mmol/L — ABNORMAL HIGH (ref 22–32)
Calcium: 9.6 mg/dL (ref 8.9–10.3)
Chloride: 92 mmol/L — ABNORMAL LOW (ref 98–111)
Creatinine, Ser: 1.07 mg/dL — ABNORMAL HIGH (ref 0.44–1.00)
GFR, Estimated: 59 mL/min — ABNORMAL LOW (ref >60)
Glucose, Bld: 158 mg/dL — ABNORMAL HIGH (ref 70–99)
Potassium: 3.1 mmol/L — ABNORMAL LOW (ref 3.5–5.1)
Sodium: 137 mmol/L (ref 135–145)

## 2021-03-01 MED ORDER — SPIRONOLACTONE 25 MG PO TABS: 12.5000 mg | ORAL_TABLET | Freq: Every day | ORAL | 3 refills | Status: DC

## 2021-03-01 NOTE — Progress Notes (Signed)
Advanced HF Clinic Note   PCP:  Scifres, Dorothy, PA-C  Cardiologist:  No primary care provider on file. 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 Underwent emergent cath 02/03/18 showed LAD 100% stenosed, LCx 95% stenosed. Taken for emergent CABG 02/03/18. Required impella post op. Hospital course complicated by cardiogenic shock, HCAP, A fib, respiratory failure, and swallowing issues. She was discharged to SNF. Discharge weight 103 pounds.   In 2019 had multiple hospitalizations for HF and pleural effusion. Underwent pleurodesis at [facility].  Admitted 3/20 with NSTEMI and HF. Received DES to ostial ramus into distal left main based on cath below and underwent diuresis. Meds adjusted as tolerated. Echo with EF 25-30%.   Admitted 10/2020 with COPD flare with mild HF and hemoptysis. CTA showed stable scarring in the right hilum consistent with emphysema.. Underwent bronchoscopy on 11/15, no obvious source of bleeding found on examination.   Echo 11/21 EF 20-25%. RV mildly HK.   Had post hospital f/u on 11/10/20 and was fluid overloaded on device interrogation. She was instructed to take Metolazone 2.97m x 2 days + 40 mEq KCl and torsemide was increased from 40>>60 mg daily.   Had return f/u again on 11/15/20 and was felt to be dry/ over diuresed. Optivol fluid index was way down. Labs were c/w AKI. SCr had bumped from 0.95>>1.82. BUN 14>>38. She was instructed to stop metolazone and hold torsemide x 2 days, then resume at alternating dosing 60 mg one day and 40 mg the next.  Admitted 02/09/21 with chest pain and shortness of breath. HS-Trop with  no trend. Echo with EF < 20%. Treated for AECOPD exacerbation. Breathing improved. Discharged on torsemide 60 mg daily.   Today she returns for post hospital HF follow up.Overall feeling fine. She has been walking a few days a week.  SOB with exertion but back to her baseline. No chest pain. Denies PND/Orthopnea.Tries not use oxygen at night. Appetite ok. No fever or chills. Weight at home has been stable. Taking all medications.  Cardidac Studies: Echo 02/12/19: EF 25-30%, RV mildly reduced  LHC 02/12/19:  Ost LAD to Prox LAD lesion is 100% stenosed.  Ost Cx to Prox Cx lesion is 100% stenosed.  Ost Ramus lesion is 99% stenosed.  Post intervention, there is a 0% residual stenosis.  A drug-eluting stent was successfully placed using a [id number] [id number].  SVG and is normal in caliber.  The graft exhibits no disease.  Ost 1st Diag lesion is 70% stenosed.  SVG and is normal in caliber.  Prox Graft lesion is 100% stenosed. 1. Significant underlying two-vessel coronary artery disease with patent SVG to OM and SVG to diagonal which supplies the LAD territory. Atretic LIMA to LAD given competitive flow from SVG to diagonal. Severe ostial ramus artery stenosis not supplied by bypass with his area suggestive of plaque rupture. 2. Severely elevated left ventricular end-diastolic pressure 34 mmHg. Left ventricular angiography was not performed. 3. Successful angioplasty and drug-eluting stent placement to the ostial ramus extending into the distal left main.  Past Medical History:  Diagnosis Date  . [place] systolic [place]ive heart [place] 02/03[place]  . Allergy   .  Anxiety   . Asthma   . DM2 (diabetes mellitus, type 2) ([place]) 10/20/2020  . Hypertension   . PAF (paroxysmal atrial fibrillation) ([place])   . Prophylactic measure 08/03/14-08/19/14   Prophyl. cranial radiation 24 Gy  . S/P emergency CABG x 3 02/03/2018   LIMA to LAD, SVG to D1, SVG to OM1, EVH via right thigh with  implantation of Impella LD LVAD via direct aortic approach  . Small cell lung cancer ([place]) 03/16/2014   Past Surgical History:  Procedure Laterality Date  . BRONCHIAL BRUSHINGS  10/25/2020   Procedure: BRONCHIAL BRUSHINGS;  Surgeon: Collene Gobble, MD;  Location: [facility] ENDOSCOPY;  Service: Pulmonary;;  . BRONCHIAL NEEDLE ASPIRATION BIOPSY  10/25/2020   Procedure: BRONCHIAL NEEDLE ASPIRATION BIOPSIES;  Surgeon: Collene Gobble, MD;  Location: [facility] ENDOSCOPY;  Service: Pulmonary;;  . CARDIAC DEFIBRILLATOR PLACEMENT  08/15/2018   MDT Visia AF MRI VR ICD implanted by Dr Loel Lofty for primary prevention of sudden  . CESAREAN SECTION    . CORONARY ARTERY BYPASS GRAFT N/A 02/03/2018   Procedure: CORONARY ARTERY BYPASS GRAFTING (CABG);  Surgeon: Rexene Alberts, MD;  Location: [place];  Service: Open Heart Surgery;  Laterality: N/A;  Time 3 using left internal mammary artery and endoscopically harvested right saphenous vein  . CORONARY BALLOON ANGIOPLASTY N/A 02/03/2018   Procedure: CORONARY BALLOON ANGIOPLASTY;  Surgeon: Martinique, Peter M, MD;  Location: [place] CV LAB;  Service: Cardiovascular;  Laterality: N/A;  . CORONARY STENT INTERVENTION N/A 02/12/2019   Procedure: CORONARY STENT INTERVENTION;  Surgeon: Wellington Hampshire, MD;  Location: [place] CV LAB;  Service: Cardiovascular;  Laterality: N/A;  . CORONARY/GRAFT ACUTE MI REVASCULARIZATION N/A 02/03/2018   Procedure: Coronary/Graft Acute MI Revascularization;  Surgeon: Martinique, Peter M, MD;  Location: [place] CV LAB;  Service: Cardiovascular;  Laterality: N/A;  . ENDOBRONCHIAL ULTRASOUND N/A 10/25/2020   Procedure: ENDOBRONCHIAL ULTRASOUND;  Surgeon: Collene Gobble, MD;  Location: [facility] ENDOSCOPY;  Service: Pulmonary;  Laterality: N/A;  . FLEXIBLE BRONCHOSCOPY  10/25/2020   Procedure: FLEXIBLE BRONCHOSCOPY;  Surgeon: Collene Gobble, MD;  Location: [facility] ENDOSCOPY;  Service: Pulmonary;;  . IABP INSERTION N/A 02/03/2018   Procedure: IABP Insertion;   Surgeon: Martinique, Peter M, MD;  Location: [place] CV LAB;  Service: Cardiovascular;  Laterality: N/A;  . INTRAOPERATIVE TRANSESOPHAGEAL ECHOCARDIOGRAM N/A 02/03/2018   Procedure: INTRAOPERATIVE TRANSESOPHAGEAL ECHOCARDIOGRAM;  Surgeon: Rexene Alberts, MD;  Location: [place];  Service: Open Heart Surgery;  Laterality: N/A;  . LEFT HEART CATH AND CORONARY ANGIOGRAPHY N/A 02/03/2018   Procedure: LEFT HEART CATH AND CORONARY ANGIOGRAPHY;  Surgeon: Martinique, Peter M, MD;  Location: [place] CV LAB;  Service: Cardiovascular;  Laterality: N/A;  . LEFT HEART CATH AND CORS/GRAFTS ANGIOGRAPHY N/A 02/12/2019   Procedure: LEFT HEART CATH AND CORS/GRAFTS ANGIOGRAPHY;  Surgeon: Wellington Hampshire, MD;  Location: [place] CV LAB;  Service: Cardiovascular;  Laterality: N/A;  . MEDIASTINOSCOPY N/A 03/11/2014   Procedure: MEDIASTINOSCOPY;  Surgeon: Melrose Nakayama, MD;  Location: [place];  Service: Thoracic;  Laterality: N/A;  . PLACEMENT OF [place] LEFT VENTRICULAR ASSIST DEVICE  02/03/2018   Procedure: PLACEMENT OF [place] LEFT VENTRICULAR ASSIST DEVICE LD;  Surgeon: Rexene Alberts, MD;  Location: [place];  Service: Open Heart Surgery;;  . REMOVAL OF IMPELLA LEFT VENTRICULAR ASSIST DEVICE N/A 02/08/2018   Procedure: REMOVAL OF [place] LEFT VENTRICULAR ASSIST DEVICE;  Surgeon: Rexene Alberts, MD;  Location: [place];  Service: Open Heart Surgery;  Laterality: N/A;  . RIGHT HEART CATH N/A 02/03/2018   Procedure: RIGHT HEART CATH;  Surgeon: Martinique, Peter M, MD;  Location: [place] CV LAB;  Service: Cardiovascular;  Laterality: N/A;  . RIGHT HEART CATH N/A 05/09/2018   Procedure: RIGHT HEART CATH;  Surgeon: Jolaine Artist, MD;  Location: [place] CV LAB;  Service: Cardiovascular;  Laterality: N/A;  . TEE WITHOUT CARDIOVERSION N/A 02/08/2018   Procedure: TRANSESOPHAGEAL ECHOCARDIOGRAM (TEE);  Surgeon: Rexene Alberts, MD;  Location: [place];  Service: Open Heart Surgery;  Laterality: N/A;  . TUBAL LIGATION    .  VIDEO BRONCHOSCOPY WITH ENDOBRONCHIAL ULTRASOUND N/A 03/11/2014   Procedure: VIDEO BRONCHOSCOPY WITH ENDOBRONCHIAL ULTRASOUND;  Surgeon: Melrose Nakayama, MD;  Location: [place];  Service: Thoracic;  Laterality: N/A;     Current Outpatient Medications  Medication Sig Dispense Refill  . albuterol (VENTOLIN HFA) 108 (90 Base) MCG/ACT inhaler Inhale 2 puffs into the lungs every 6 (six) hours as needed for wheezing or shortness of breath. 1 Inhaler 0  . ALPRAZolam (XANAX) 0.25 MG tablet Take 1 tablet (0.25 mg total) by mouth daily as needed for anxiety. 10 tablet 0  . aspirin EC 81 MG tablet Take 1 tablet (81 mg total) by mouth daily. Swallow whole. 30 tablet 3  . atorvastatin (LIPITOR) 80 MG tablet Take 1 tablet (80 mg total) by mouth daily at 6 PM. (Patient taking differently: Take 80 mg by mouth every evening.) 30 tablet 1  . dapagliflozin propanediol (FARXIGA) 10 MG TABS tablet Take 1 tablet (10 mg total) by mouth daily before breakfast. 30 tablet 6  . feeding supplement (ENSURE ENLIVE / ENSURE PLUS) LIQD Take 237 mLs by mouth 3 (three) times daily between meals. 237 mL 12  . ipratropium-albuterol (DUONEB) 0.5-2.5 (3) MG/3ML SOLN Take 3 mLs by nebulization 2 (two) times daily as needed (for shortness of breath).    . ivabradine (CORLANOR) 7.5 MG TABS tablet Take 1 tablet (7.5 mg total) by mouth 2 (two) times daily with a meal. 60 tablet 5  . metolazone (ZAROXOLYN) 2.5 MG tablet Take 1 tablet (2.5 mg total) by mouth as directed. Take today and tomorrow and then hold till directed (Patient taking differently: Take 2.5 mg by mouth daily as needed (For fluid). Take today and tomorrow and then hold till directed) 10 tablet 0  . nitroGLYCERIN (NITROSTAT) 0.4 MG SL tablet Place 1 tablet (0.4 mg total) under the tongue every 5 (five) minutes as needed for chest pain. 90 tablet 5  . potassium chloride SA (KLOR-CON) 20 MEQ tablet Take 2 tablets (40 mEq total) by mouth daily. Take 65mq (2 tablets) with  metolazone doses 120 tablet 3  . torsemide (DEMADEX) 20 MG tablet Take 3 tablets (60 mg total) by mouth daily. 90 tablet 3   No current facility-administered medications for this encounter.    Allergies:   Codeine   Social History:  The patient  reports that she quit smoking about 6 years ago. Her smoking use included cigarettes. She has a 20.00 pack-year smoking history. She has never used smokeless tobacco. She reports current alcohol use of about 8.0 standard drinks of alcohol per week. She reports current drug use. Drug: Marijuana.   Family History:  The patient's family history includes Cancer in her maternal grandmother; Diabetes in her paternal grandmother; Heart attack in her mother; Heart disease in her mother; Hypertension in her maternal grandmother and mother.   ROS:  Please see the history of present illness.  All other systems are personally reviewed and negative.    Vitals:   03/01/21 1414  BP: 120/76  Pulse: 75  SpO2: 98%  Weight: 47.8 kg (105 lb 6.4 oz)   PHYSICAL EXAM: General:  Walked in the clinic.  No resp difficulty HEENT: normal Neck: supple. JVP 6-7. Carotids 2+ bilat; no bruits. No lymphadenopathy or thryomegaly appreciated. Cor: PMI nondisplaced. Regular rate & rhythm. No rubs, gallops or murmurs. Lungs: clear Abdomen: soft, nontender, nondistended. No hepatosplenomegaly. No bruits or masses. Good bowel sounds. Extremities: no cyanosis, clubbing, rash, edema Neuro: alert & orientedx3, cranial nerves grossly intact. moves all 4 extremities w/o difficulty. Affect pleasant  ICD today:  No VT/AFIb. Activity ~ 1.5 hours per day.   Recent Labs: 10/20/2020: B Natriuretic Peptide 1,118.4 02/09/2021: ALT 16; TSH 0.765 02/12/2021: BUN 33; Creatinine, Ser 1.12; Hemoglobin 14.6; Platelets 150; Potassium 3.7; Sodium 135  Personally reviewed   Wt Readings from Last 3 Encounters:  03/01/21 47.8 kg (105 lb 6.4 oz)  02/12/21 45.7 kg (100 lb 12 oz)  02/09/21 47.7 kg  (105 lb 3.2 oz)    ASSESSMENT AND PLAN:  1. CAD  - Hx of NSTEMI/STEMI s/p Emergent CABG 02/03/18: Cath with severe 2v CAD as above with severe LV dysfunction/ICM. s/p Emergent CABG 02/03/18. - Had NSTEMI and now s/p LHC 02/12/19 with DES to the ostial ramus extending into the distal left main - No chest pain.  - Brilinta stopped at recent discharge due to bleeding. - On ASA.  -Continue hi-intensity statin.  2.  COPD -No wheeze on exam.  - On  Duonebs  3.ChronicSystolic HF due to ICM. S/p Medtronic single chamber ICD. -Echo 09/04/18 ([facility]) LVEF 20%, Moderate AI, Mild MR, Mild TR, Severe LAE, RV mildly decreased.  - Echo 02/12/19: EF 25-30%, RV mildly reduced - Echo 11/21 EF 20-25% RV mild HK. - Echo 2022 EF < 20% RV normal - NYHA  II. Volume status stable.  - Continue torsemide 60 mg daily + 40 mEq of KCl daily.  - Add 12.5 mg spiro daily - Continue corlanor 7.5 mg bid. - Continue Farxiga 10 mg daily. - No b-blocker yet due to intolerance with low output - Check BMET today and in 7 days. .   4. Recurrent pleural effusions s/p pleurodesis Resolved  5. Chronic hypoxic respiratory failure - Multifactorial - Wears O2 PRN at home. O2 sats 98% today on room air.  - She is followed by pulmonology.   5. PAF - CHA2DS2/VASc is at least 4. (CHF, Vasc disease, HTN, Female).  - She has history of Afib RVR in the past in the setting of her Lung CA.  - Previously on Xarelto but stopped dur to hemoptysis.  - No A fib on device interrogation.  - On ASA.   6. 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  - Chest CT 02/19/18 with mass-like consolidation in R hilum concerning for recurrent tumor.  - Repeat CT 03/27/2018 -No definite findings of locally recurrent tumor in the right lung. 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 Continued chest CT surveillance is advised in 3-6 months. - S/p thoracentesis  04/2018. Cytology negative for malignancy.  - CTA 11/19 stable scarring in the right hilum consistent with emphysema. Bronchoscopy on 11/15, no obvious source of bleeding  Check BMET and in 7 days.  Follow up in 4 weeks with APP.   Jeanmarie Hubert, NP-C 03/01/2021 2:31 PM

## 2021-03-01 NOTE — Patient Instructions (Signed)
Labs done today. We will contact you only if your labs are abnormal.  START Spironolactone 12.5mg  (1/2 tablet) by mouth daily.   No other medication changes were made. Please continue all current medications as prescribed.  Your physician recommends that you schedule a follow-up appointment in: 1 week for a lab only appointment and in 4 weeks for an appointment with our APP Clinic here in office   If you have any questions or concerns before your next appointment please send [place] a message through [place] or call our office at [phone].    TO LEAVE A MESSAGE FOR THE NURSE SELECT OPTION 2, PLEASE LEAVE A MESSAGE INCLUDING: . YOUR NAME . DATE OF BIRTH . CALL BACK NUMBER . REASON FOR CALL**this is important as we prioritize the call backs  YOU WILL RECEIVE A CALL BACK THE SAME DAY AS LONG AS YOU CALL BEFORE 4:00 PM   Do the following things EVERYDAY: 1) Weigh yourself in the morning before breakfast. Write it down and keep it in a log. 2) Take your medicines as prescribed 3) Eat low salt foods--Limit salt (sodium) to 2000 mg per day.  4) Stay as active as you can everyday 5) Limit all fluids for the day to less than 2 liters   At the [facility], you and your health needs are our priority. As part of our continuing mission to provide you with exceptional heart care, we have created designated Provider Care Teams. These Care Teams include your primary Cardiologist (physician) and Advanced Practice Providers (APPs- Physician Assistants and Nurse Practitioners) who all work together to provide you with the care you need, when you need it.   You may see any of the following providers on your designated Care Team at your next follow up: Marland Kitchen Dr Glori Bickers . Dr Loralie Champagne . Darrick Grinder, NP . Lyda Jester, PA . Audry Riles, PharmD   Please be sure to bring in all your medications bottles to every appointment.

## 2021-03-08 ENCOUNTER — Other Ambulatory Visit (HOSPITAL_COMMUNITY): Payer: Medicare Other

## 2021-03-14 ENCOUNTER — Other Ambulatory Visit: Payer: Self-pay

## 2021-03-14 ENCOUNTER — Ambulatory Visit (HOSPITAL_COMMUNITY)
Admission: RE | Admit: 2021-03-14 | Discharge: 2021-03-14 | Disposition: A | Discharge status: Home / Self Care | Payer: BC Managed Care – PPO | Source: Ambulatory Visit | Attending: Internal Medicine | Admitting: Internal Medicine

## 2021-03-14 DIAGNOSIS — I5042 Chronic combined systolic (congestive) and diastolic (congestive) heart failure: Secondary | ICD-10-CM | POA: Diagnosis present

## 2021-03-14 LAB — BASIC METABOLIC PANEL
Anion gap: 10 (ref 5–15)
BUN: 22 mg/dL — ABNORMAL HIGH (ref 6–20)
CO2: 29 mmol/L (ref 22–32)
Calcium: 10.1 mg/dL (ref 8.9–10.3)
Chloride: 100 mmol/L (ref 98–111)
Creatinine, Ser: 1.28 mg/dL — ABNORMAL HIGH (ref 0.44–1.00)
GFR, Estimated: 48 mL/min — ABNORMAL LOW (ref >60)
Glucose, Bld: 148 mg/dL — ABNORMAL HIGH (ref 70–99)
Potassium: 4.1 mmol/L (ref 3.5–5.1)
Sodium: 139 mmol/L (ref 135–145)

## 2021-03-28 ENCOUNTER — Ambulatory Visit (INDEPENDENT_AMBULATORY_CARE_PROVIDER_SITE_OTHER): Payer: BC Managed Care – PPO

## 2021-03-28 DIAGNOSIS — I5042 Chronic combined systolic (congestive) and diastolic (congestive) heart failure: Secondary | ICD-10-CM | POA: Diagnosis not present

## 2021-03-28 DIAGNOSIS — Z95810 Presence of automatic (implantable) cardiac defibrillator: Secondary | ICD-10-CM

## 2021-03-29 ENCOUNTER — Other Ambulatory Visit: Payer: Self-pay

## 2021-03-29 ENCOUNTER — Encounter (HOSPITAL_COMMUNITY): Payer: Self-pay

## 2021-03-29 ENCOUNTER — Ambulatory Visit (HOSPITAL_COMMUNITY)
Admission: RE | Admit: 2021-03-29 | Discharge: 2021-03-29 | Disposition: A | Discharge status: Home / Self Care | Payer: BC Managed Care – PPO | Source: Ambulatory Visit | Attending: Adult Health | Admitting: Adult Health

## 2021-03-29 VITALS — BP 120/70 | HR 73 | Wt 107.0 lb

## 2021-03-29 DIAGNOSIS — I5022 Chronic systolic (congestive) heart failure: Secondary | ICD-10-CM | POA: Insufficient documentation

## 2021-03-29 DIAGNOSIS — Z951 Presence of aortocoronary bypass graft: Secondary | ICD-10-CM | POA: Insufficient documentation

## 2021-03-29 DIAGNOSIS — I110 Hypertensive heart disease with heart failure: Secondary | ICD-10-CM | POA: Insufficient documentation

## 2021-03-29 DIAGNOSIS — Z8249 Family history of ischemic heart disease and other diseases of the circulatory system: Secondary | ICD-10-CM | POA: Diagnosis not present

## 2021-03-29 DIAGNOSIS — Z7901 Long term (current) use of anticoagulants: Secondary | ICD-10-CM | POA: Diagnosis not present

## 2021-03-29 DIAGNOSIS — J440 Chronic obstructive pulmonary disease with acute lower respiratory infection: Secondary | ICD-10-CM | POA: Insufficient documentation

## 2021-03-29 DIAGNOSIS — I2510 Atherosclerotic heart disease of native coronary artery without angina pectoris: Secondary | ICD-10-CM | POA: Insufficient documentation

## 2021-03-29 DIAGNOSIS — J9611 Chronic respiratory failure with hypoxia: Secondary | ICD-10-CM | POA: Diagnosis not present

## 2021-03-29 DIAGNOSIS — Z79899 Other long term (current) drug therapy: Secondary | ICD-10-CM | POA: Insufficient documentation

## 2021-03-29 DIAGNOSIS — I480 Paroxysmal atrial fibrillation: Secondary | ICD-10-CM | POA: Insufficient documentation

## 2021-03-29 DIAGNOSIS — I252 Old myocardial infarction: Secondary | ICD-10-CM | POA: Insufficient documentation

## 2021-03-29 DIAGNOSIS — Z7982 Long term (current) use of aspirin: Secondary | ICD-10-CM | POA: Diagnosis not present

## 2021-03-29 DIAGNOSIS — J90 Pleural effusion, not elsewhere classified: Secondary | ICD-10-CM | POA: Diagnosis not present

## 2021-03-29 DIAGNOSIS — Z7984 Long term (current) use of oral hypoglycemic drugs: Secondary | ICD-10-CM | POA: Insufficient documentation

## 2021-03-29 DIAGNOSIS — Z87891 Personal history of nicotine dependence: Secondary | ICD-10-CM | POA: Diagnosis not present

## 2021-03-29 NOTE — Patient Instructions (Signed)
INCREASE Torsemide to 80 mg daily for 2 days then 60 gm daily thereafter  Your physician recommends that you schedule a follow-up appointment in: 3 months with Dr [name]  Do the following things EVERYDAY: 1) Weigh yourself in the morning before breakfast. Write it down and keep it in a log. 2) Take your medicines as prescribed 3) Eat low salt foods--Limit salt (sodium) to 2000 mg per day.  4) Stay as active as you can everyday 5) Limit all fluids for the day to less than 2 liters  At the [facility], you and your health needs are our priority. As part of our continuing mission to provide you with exceptional heart care, we have created designated Provider Care Teams. These Care Teams include your primary Cardiologist (physician) and Advanced Practice Providers (APPs- Physician Assistants and Nurse Practitioners) who all work together to provide you with the care you need, when you need it.   You may see any of the following providers on your designated Care Team at your next follow up: Marland Kitchen Dr Glori Bickers . Dr Loralie Champagne . Dr Vickki Muff . Darrick Grinder, NP . Lyda Jester, [place] . Audry Riles, PharmD   Please be sure to bring in all your medications bottles to every appointment.   If you have any questions or concerns before your next appointment please send [place] a message through [place] or call our office at [phone].    TO LEAVE A MESSAGE FOR THE NURSE SELECT OPTION 2, PLEASE LEAVE A MESSAGE INCLUDING: . YOUR NAME . DATE OF BIRTH . CALL BACK NUMBER . REASON FOR CALL**this is important as we prioritize the call backs  YOU WILL RECEIVE A CALL BACK THE SAME DAY AS LONG AS YOU CALL BEFORE 4:00 PM  Please see our updated No Show and Same Day Appointment Cancellation Policy attached to your AVS.

## 2021-03-29 NOTE — Progress Notes (Signed)
Advanced HF Clinic Note   PCP:  Scifres, Dorothy, PA-C  Cardiologist:  No primary care provider on file. 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 Underwent emergent cath 02/03/18 showed LAD 100% stenosed, LCx 95% stenosed. Taken for emergent CABG 02/03/18. Required impella post op. Hospital course complicated by cardiogenic shock, HCAP, A fib, respiratory failure, and swallowing issues. She was discharged to SNF. Discharge weight 103 pounds.   In 2019 had multiple hospitalizations for HF and pleural effusion. Underwent pleurodesis at [facility].  Admitted 3/20 with NSTEMI and HF. Received DES to ostial ramus into distal left main based on cath below and underwent diuresis. Meds adjusted as tolerated. Echo with EF 25-30%.   Admitted 10/2020 with COPD flare with mild HF and hemoptysis. CTA showed stable scarring in the right hilum consistent with emphysema.. Underwent bronchoscopy on 11/15, no obvious source of bleeding found on examination.   Echo 11/21 EF 20-25%. RV mildly HK.   Had post hospital f/u on 11/10/20 and was fluid overloaded on device interrogation. She was instructed to take Metolazone 2.87m x 2 days + 40 mEq KCl and torsemide was increased from 40>>60 mg daily.   Had return f/u again on 11/15/20 and was felt to be dry/ over diuresed. Optivol fluid index was way down. Labs were c/w AKI. SCr had bumped from 0.95>>1.82. BUN 14>>38. She was instructed to stop metolazone and hold torsemide x 2 days, then resume at alternating dosing 60 mg one day and 40 mg the next.  Admitted 02/09/21 with chest pain and shortness of breath. HS-Trop with  no trend. Echo with EF < 20%. Treated for AECOPD exacerbation. Breathing improved. Discharged on torsemide 60 mg daily.   Today she returns for HF follow up.Overall feeling fine. Mild SOB with exertion. Denies PND/Orthopnea. Appetite ok. Had bacon over the weekend and last night.  No fever or chills. Weight at home  103-->107pounds. Taking all medications.   Cardidac Studies: Echo 02/12/19: EF 25-30%, RV mildly reduced  LHC 02/12/19:  Ost LAD to Prox LAD lesion is 100% stenosed.  Ost Cx to Prox Cx lesion is 100% stenosed.  Ost Ramus lesion is 99% stenosed.  Post intervention, there is a 0% residual stenosis.  A drug-eluting stent was successfully placed using a [id number].  SVG and is normal in caliber.  The graft exhibits no disease.  Ost 1st Diag lesion is 70% stenosed.  SVG and is normal in caliber.  Prox Graft lesion is 100% stenosed. 1. Significant underlying two-vessel coronary artery disease with patent SVG to OM and SVG to diagonal which supplies the LAD territory. Atretic LIMA to LAD given competitive flow from SVG to diagonal. Severe ostial ramus artery stenosis not supplied by bypass with his area suggestive of plaque rupture. 2. Severely elevated left ventricular end-diastolic pressure 34 mmHg. Left ventricular angiography was not performed. 3. Successful angioplasty and drug-eluting stent placement to the ostial ramus extending into the distal left main.  Past Medical History:  Diagnosis Date  . Acute systolic congestive heart failure ([place] 02/03/2018  . Allergy   . Anxiety   . Asthma   . DM2 (diabetes mellitus, type 2) ([place]  10/20/2020  . Hypertension   . PAF (paroxysmal atrial fibrillation) ([place])   . Prophylactic measure 08/03/14-08/19/14   Prophyl. cranial radiation 24 Gy  . 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 Small cell  lung cancer ([place]) 03/16/2014   Past Surgical History:  Procedure Laterality Date  . BRONCHIAL BRUSHINGS  10/25/2020   Procedure: BRONCHIAL BRUSHINGS;  Surgeon: Collene Gobble, MD;  Location: [facility] ENDOSCOPY;  Service: Pulmonary;;  . BRONCHIAL NEEDLE ASPIRATION BIOPSY  10/25/2020   Procedure: BRONCHIAL NEEDLE ASPIRATION BIOPSIES;  Surgeon: Collene Gobble, MD;  Location: [facility] ENDOSCOPY;  Service: Pulmonary;;  . CARDIAC DEFIBRILLATOR PLACEMENT  08/15/2018   MDT Visia AF MRI VR ICD implanted by Dr Loel Lofty for primary prevention of sudden  . CESAREAN SECTION    . CORONARY ARTERY BYPASS GRAFT N/A 02/03/2018   Procedure: CORONARY ARTERY BYPASS GRAFTING (CABG);  Surgeon: Rexene Alberts, MD;  Location: [place];  Service: Open Heart Surgery;  Laterality: N/A;  Time 3 using left internal mammary artery and endoscopically harvested right saphenous vein  . CORONARY BALLOON ANGIOPLASTY N/A 02/03/2018   Procedure: CORONARY BALLOON ANGIOPLASTY;  Surgeon: Martinique, Peter M, MD;  Location: [place] CV LAB;  Service: Cardiovascular;  Laterality: N/A;  . CORONARY STENT INTERVENTION N/A 02/12/2019   Procedure: CORONARY STENT INTERVENTION;  Surgeon: Wellington Hampshire, MD;  Location: [place] CV LAB;  Service: Cardiovascular;  Laterality: N/A;  . CORONARY/GRAFT ACUTE MI REVASCULARIZATION N/A 02/03/2018   Procedure: Coronary/Graft Acute MI Revascularization;  Surgeon: Martinique, Peter M, MD;  Location: [place] CV LAB;  Service: Cardiovascular;  Laterality: N/A;  . ENDOBRONCHIAL ULTRASOUND N/A 10/25/2020   Procedure: ENDOBRONCHIAL ULTRASOUND;  Surgeon: Collene Gobble, MD;  Location: [facility] ENDOSCOPY;  Service: Pulmonary;  Laterality: N/A;  . FLEXIBLE BRONCHOSCOPY  10/25/2020   Procedure: FLEXIBLE BRONCHOSCOPY;  Surgeon: Collene Gobble, MD;  Location: [facility] ENDOSCOPY;  Service: Pulmonary;;  . IABP INSERTION N/A 02/03/2018   Procedure: IABP Insertion;  Surgeon: Martinique, Peter M, MD;  Location: [place] CV LAB;  Service:  Cardiovascular;  Laterality: N/A;  . INTRAOPERATIVE TRANSESOPHAGEAL ECHOCARDIOGRAM N/A 02/03/2018   Procedure: INTRAOPERATIVE TRANSESOPHAGEAL ECHOCARDIOGRAM;  Surgeon: Rexene Alberts, MD;  Location: [place];  Service: Open Heart Surgery;  Laterality: N/A;  . LEFT HEART CATH AND CORONARY ANGIOGRAPHY N/A 02/03/2018   Procedure: LEFT HEART CATH AND CORONARY ANGIOGRAPHY;  Surgeon: Martinique, Peter M, MD;  Location: [place] CV LAB;  Service: Cardiovascular;  Laterality: N/A;  . LEFT HEART CATH AND CORS/GRAFTS ANGIOGRAPHY N/A 02/12/2019   Procedure: LEFT HEART CATH AND CORS/GRAFTS ANGIOGRAPHY;  Surgeon: Wellington Hampshire, MD;  Location: [place] CV LAB;  Service: Cardiovascular;  Laterality: N/A;  . MEDIASTINOSCOPY N/A 03/11/2014   Procedure: MEDIASTINOSCOPY;  Surgeon: Melrose Nakayama, MD;  Location: [place];  Service: Thoracic;  Laterality: N/A;  . PLACEMENT OF [place] LEFT VENTRICULAR ASSIST DEVICE  02/03/2018   Procedure: PLACEMENT OF [place] LEFT VENTRICULAR ASSIST DEVICE LD;  Surgeon: Rexene Alberts, MD;  Location: [place];  Service: Open Heart Surgery;;  . REMOVAL OF IMPELLA LEFT VENTRICULAR ASSIST DEVICE N/A 02/08/2018   Procedure: REMOVAL OF [place] LEFT VENTRICULAR ASSIST DEVICE;  Surgeon: Rexene Alberts, MD;  Location: [place];  Service: Open Heart Surgery;  Laterality: N/A;  . RIGHT HEART CATH N/A 02/03/2018   Procedure: RIGHT HEART  CATH;  Surgeon: Martinique, Peter M, MD;  Location: [place] CV LAB;  Service: Cardiovascular;  Laterality: N/A;  . RIGHT HEART CATH N/A 05/09/2018   Procedure: RIGHT HEART CATH;  Surgeon: Jolaine Artist, MD;  Location: [place] CV LAB;  Service: Cardiovascular;  Laterality: N/A;  . TEE WITHOUT CARDIOVERSION N/A 02/08/2018   Procedure: TRANSESOPHAGEAL ECHOCARDIOGRAM (TEE);  Surgeon: Rexene Alberts, MD;  Location: [place];  Service: Open Heart Surgery;  Laterality: N/A;  . TUBAL LIGATION    . VIDEO BRONCHOSCOPY WITH ENDOBRONCHIAL ULTRASOUND N/A 03/11/2014    Procedure: VIDEO BRONCHOSCOPY WITH ENDOBRONCHIAL ULTRASOUND;  Surgeon: Melrose Nakayama, MD;  Location: [place];  Service: Thoracic;  Laterality: N/A;     Current Outpatient Medications  Medication Sig Dispense Refill  . albuterol (VENTOLIN HFA) 108 (90 Base) MCG/ACT inhaler Inhale 2 puffs into the lungs every 6 (six) hours as needed for wheezing or shortness of breath. 1 Inhaler 0  . ALPRAZolam (XANAX) 0.25 MG tablet Take 1 tablet (0.25 mg total) by mouth daily as needed for anxiety. 10 tablet 0  . aspirin EC 81 MG tablet Take 1 tablet (81 mg total) by mouth daily. Swallow whole. 30 tablet 3  . atorvastatin (LIPITOR) 80 MG tablet Take 1 tablet (80 mg total) by mouth daily at 6 PM. (Patient taking differently: Take 80 mg by mouth every evening.) 30 tablet 1  . dapagliflozin propanediol (FARXIGA) 10 MG TABS tablet Take 1 tablet (10 mg total) by mouth daily before breakfast. 30 tablet 6  . feeding supplement (ENSURE ENLIVE / ENSURE PLUS) LIQD Take 237 mLs by mouth 3 (three) times daily between meals. 237 mL 12  . ipratropium-albuterol (DUONEB) 0.5-2.5 (3) MG/3ML SOLN Take 3 mLs by nebulization 2 (two) times daily as needed (for shortness of breath).    . ivabradine (CORLANOR) 7.5 MG TABS tablet Take 1 tablet (7.5 mg total) by mouth 2 (two) times daily with a meal. 60 tablet 5  . nitroGLYCERIN (NITROSTAT) 0.4 MG SL tablet Place 1 tablet (0.4 mg total) under the tongue every 5 (five) minutes as needed for chest pain. 90 tablet 5  . potassium chloride SA (KLOR-CON) 20 MEQ tablet Take 2 tablets (40 mEq total) by mouth daily. Take 75mq (2 tablets) with metolazone doses 120 tablet 3  . spironolactone (ALDACTONE) 25 MG tablet Take 0.5 tablets (12.5 mg total) by mouth daily. 45 tablet 3  . torsemide (DEMADEX) 20 MG tablet Take 3 tablets (60 mg total) by mouth daily. 90 tablet 3  . metolazone (ZAROXOLYN) 2.5 MG tablet Take 1 tablet (2.5 mg total) by mouth as directed. Take today and tomorrow and then hold  till directed (Patient not taking: Reported on 03/29/2021) 10 tablet 0   No current facility-administered medications for this encounter.    Allergies:   Codeine   Social History:  The patient  reports that she quit smoking about 7 years ago. Her smoking use included cigarettes. She has a 20.00 pack-year smoking history. She has never used smokeless tobacco. She reports current alcohol use of about 8.0 standard drinks of alcohol per week. She reports current drug use. Drug: Marijuana.   Family History:  The patient's family history includes Cancer in her maternal grandmother; Diabetes in her paternal grandmother; Heart attack in her mother; Heart disease in her mother; Hypertension in her maternal grandmother and mother.   ROS:  Please see the history of present illness.   All other systems are personally reviewed and negative.  Vitals:   03/29/21 1327  BP: 120/70  Pulse: 73  SpO2: 98%  Weight: 48.5 kg (107 lb)   PHYSICAL EXAM: General: No resp difficulty. Walked in the clinic.  HEENT: normal Neck: supple. JVP  9-10  Carotids 2+ bilat; no bruits. No lymphadenopathy or thryomegaly appreciated. Cor: PMI nondisplaced. Regular rate & rhythm. No rubs, gallops or murmurs. Lungs: clear Abdomen: soft, nontender, nondistended. No hepatosplenomegaly. No bruits or masses. Good bowel sounds. Extremities: no cyanosis, clubbing, rash, edema Neuro: alert & orientedx3, cranial nerves grossly intact. moves all 4 extremities w/o difficulty. Affect pleasant   Recent Labs: 10/20/2020: B Natriuretic Peptide 1,118.4 02/09/2021: ALT 16; TSH 0.765 02/12/2021: Hemoglobin 14.6; Platelets 150 03/14/2021: BUN 22; Creatinine, Ser 1.28; Potassium 4.1; Sodium 139  Personally reviewed   Wt Readings from Last 3 Encounters:  03/29/21 48.5 kg (107 lb)  03/01/21 47.8 kg (105 lb 6.4 oz)  02/12/21 45.7 kg (100 lb 12 oz)    ASSESSMENT AND PLAN:  1. CAD  - Hx of NSTEMI/STEMI s/p Emergent CABG 02/03/18: Cath with  severe 2v CAD as above with severe LV dysfunction/ICM. s/p Emergent CABG 02/03/18. - Had NSTEMI and now s/p LHC 02/12/19 with DES to the ostial ramus extending into the distal left main - No chest pain.  - Brilinta stopped at recent discharge due to bleeding. - On ASA.  -Continue hi-intensity statin.  2.  COPD - No wheeze   - On  Duonebs  3.ChronicSystolic HF due to ICM. S/p Medtronic single chamber ICD. -Echo 09/04/18 ([facility]) LVEF 20%, Moderate AI, Mild MR, Mild TR, Severe LAE, RV mildly decreased.  - Echo 02/12/19: EF 25-30%, RV mildly reduced - Echo 11/21 EF 20-25% RV mild HK. - Echo 2022 EF < 20% RV normal - NYHA III. Fluid index trending up suspect in the setting of high sodium die.t  - Continue torsemide 60 mg daily + 40 mEq of KCl daily. And she was instructed to take an extra 20 mg torsemide for the next 2 days. Would like home weight 103-104 pounds.  - Continue 12.5 mg spiro daily - Continue corlanor 7.5 mg bid. - Continue Farxiga 10 mg daily. - No b-blocker yet due to intolerance with low output  4. Recurrent pleural effusions s/p pleurodesis Resolved  5. Chronic hypoxic respiratory failure - Multifactorial - Wears O2 PRN at home.  - On room air today with stable stats.  - She is followed by pulmonology.   5. PAF - CHA2DS2/VASc is at least 4. (CHF, Vasc disease, HTN, Female).  - She has history of Afib RVR in the past in the setting of her Lung CA.  - Previously on Xarelto but stopped dur to hemoptysis.  - No A fib on device interogation.  - No bleeding issues.  - On ASA.   6. 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  - Chest CT 02/19/18 with mass-like consolidation in R hilum concerning for recurrent tumor.  - Repeat CT 03/27/2018 -No definite findings of locally recurrent tumor in the right lung. 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 Continued chest CT surveillance is  advised in 3-6 months. - S/p thoracentesis 04/2018. Cytology negative for malignancy.  - CTA 11/19 stable scarring in the right hilum consistent with emphysema. Bronchoscopy on 11/15, no obvious source of bleeding   Follow up with Dr [name] in 3 months.  Jeanmarie Hubert, NP-C 03/29/2021 1:53 PM

## 2021-04-01 NOTE — Progress Notes (Signed)
EPIC Encounter for ICM Monitoring  Patient Name: Brittany [name] is a 60 y.o. female Date: 04/01/2021 Primary Care Physican: Maude Leriche, PA-C Primary Cardiologist:Bensimhon Electrophysiologist:Allred 4/19/2022Weight: 107lbs  Patient seen in office by Advanced HF clinic 03/29/2021  OptivolThoracic impedancesuggestingfluid level returned to normal and above baseline which is likely related to the use of Metolazone.  Prescribed:   Torsemide20 mgTake3 tablets (60 mg total)daily.   Potassium 20 mEq take2tabletsby mouth daily. Take 2tablets (40 mEq total)with Metolzone  Metolazone 2.5 mgtake as directed  Labs: 03/14/2021 Creatinine 1.28, BUN 22, Potassium 4.1, Sodium 139, GFR 48 03/01/2021 Creatinine 1.07, BUN 17, Potassium 3.1, Sodium 137, GFR 59  02/12/2021 Creatinine1.12, BUN33, Potassium3.7, Sodium135, GFR56 02/11/2021 Creatinine1.42, BUN21, Potassium3.9, Sodium136, GFR42  02/10/2021 Creatinine0.97, BUN13, Potassium3.3, Sodium133, GFR>60  03/02/2022Creatinine 0.92, BUN11, Potassium3.3, Sodium139, GFR>60  02/17/2022Creatinine 0.95, BUN13, Potassium3.0, Sodium139, GFR>60 A complete set of results can be found in Results Review. 11/10/2020 Creatinine 0.95, BUN 14, Potassium 2.9, Sodium 141, GFR >60 A complete set of results can be found in Results Review.  Recommendations: Any recommendation given at 4/19 OV.   Follow-up plan: ICM clinic phone appointment on5/31/2022. 91 day device clinic remote transmission4/26/2022.  EP/Cardiology Office Visits: 07/01/2021 withDr Bensimhon.  Copy of ICM check sent to Dr.Allred.    3 month ICM trend: 03/29/2021.    1 Year ICM trend:       [name], RN 04/01/2021 4:29 PM

## 2021-04-05 ENCOUNTER — Ambulatory Visit (INDEPENDENT_AMBULATORY_CARE_PROVIDER_SITE_OTHER): Payer: BC Managed Care – PPO

## 2021-04-05 DIAGNOSIS — I255 Ischemic cardiomyopathy: Secondary | ICD-10-CM | POA: Diagnosis not present

## 2021-04-05 LAB — CUP PACEART REMOTE DEVICE CHECK
Battery Remaining Longevity: 117 mo
Battery Voltage: 3.03 V
Brady Statistic RV Percent Paced: 0.01 %
Date Time Interrogation Session: 20220426012304
HighPow Impedance: 67 Ohm
Implantable Lead Implant Date: 20190905
Implantable Lead Location: 753860
Implantable Pulse Generator Implant Date: 20190905
Lead Channel Impedance Value: 342 Ohm
Lead Channel Impedance Value: 456 Ohm
Lead Channel Pacing Threshold Amplitude: 0.875 V
Lead Channel Pacing Threshold Pulse Width: 0.4 ms
Lead Channel Sensing Intrinsic Amplitude: 20.875 mV
Lead Channel Sensing Intrinsic Amplitude: 20.875 mV
Lead Channel Setting Pacing Amplitude: 2 V
Lead Channel Setting Pacing Pulse Width: 0.4 ms
Lead Channel Setting Sensing Sensitivity: 0.3 mV

## 2021-04-06 ENCOUNTER — Ambulatory Visit (INDEPENDENT_AMBULATORY_CARE_PROVIDER_SITE_OTHER): Payer: Medicare Other

## 2021-04-06 DIAGNOSIS — I5022 Chronic systolic (congestive) heart failure: Secondary | ICD-10-CM

## 2021-04-06 DIAGNOSIS — Z95810 Presence of automatic (implantable) cardiac defibrillator: Secondary | ICD-10-CM

## 2021-04-06 NOTE — Progress Notes (Signed)
EPIC Encounter for ICM Monitoring  Patient Name: Brittany [name] is a 60 y.o. female Date: 04/06/2021 Primary Care Physican: Scifres, [name], PA-C Primary Cardiologist:Bensimhon Electrophysiologist:Allred 4/19/2022Weight: 107lbs (baseline 103 lbs)  Patient called to request review of transmission since she is having shortness of breath, wheezing, and 4 lb weight gain.    OptivolThoracic impedancesuggestingpossible fluid accumulation starting 03/19/2021.  Prescribed:   Torsemide20 mgTake3 tablets (60 mg total)daily.   Potassium 20 mEq take2tabletsby mouth daily. Take 2tablets (40 mEq total)with Metolzone  Metolazone 2.5 mgtake as directed  Labs: 03/14/2021 Creatinine 1.28, BUN 22, Potassium 4.1, Sodium 139, GFR 48 03/01/2021 Creatinine 1.07, BUN 17, Potassium 3.1, Sodium 137, GFR 59  02/12/2021 Creatinine1.12, BUN33, Potassium3.7, Sodium135, GFR56 02/11/2021 Creatinine1.42, BUN21, Potassium3.9, Sodium136, GFR42  02/10/2021 Creatinine0.97, BUN13, Potassium3.3, Sodium133, GFR>60  03/02/2022Creatinine 0.92, BUN11, Potassium3.3, Sodium139, GFR>60  02/17/2022Creatinine 0.95, BUN13, Potassium3.0, Sodium139, GFR>60 A complete set of results can be found in Results Review. 11/10/2020 Creatinine 0.95, BUN 14, Potassium 2.9, Sodium 141, GFR >60 A complete set of results can be found in Results Review.  Recommendations: She will take extra Furosemide 20 mg x 3-4 days per her discussing with Darrick Grinder, NP at [facility] Clinic.  Follow-up plan: ICM clinic phone appointment on5/01/2021 to recheck fluid levels. 91 day device clinic remote transmission4/26/2022.  EP/Cardiology Office Visits: 07/01/2021 withDr Bensimhon.  Copy of ICM check sent to Dr.Allred and Darrick Grinder, NP at [facility].  3 month ICM trend: 04/06/2021.    1 Year ICM trend:       [name], RN 04/06/2021 1:29 PM

## 2021-04-08 ENCOUNTER — Other Ambulatory Visit: Payer: Self-pay

## 2021-04-08 ENCOUNTER — Emergency Department (HOSPITAL_COMMUNITY): Payer: BC Managed Care – PPO

## 2021-04-08 ENCOUNTER — Encounter (HOSPITAL_COMMUNITY): Payer: Self-pay | Admitting: Emergency Medicine

## 2021-04-08 ENCOUNTER — Inpatient Hospital Stay (HOSPITAL_COMMUNITY)
Admission: EM | Admit: 2021-04-08 | Discharge: 2021-04-11 | DRG: 291 | Disposition: A | Discharge status: Home / Self Care | Payer: BC Managed Care – PPO | Attending: Internal Medicine | Admitting: Internal Medicine

## 2021-04-08 DIAGNOSIS — E1165 Type 2 diabetes mellitus with hyperglycemia: Secondary | ICD-10-CM | POA: Diagnosis present

## 2021-04-08 DIAGNOSIS — Z885 Allergy status to narcotic agent status: Secondary | ICD-10-CM

## 2021-04-08 DIAGNOSIS — Z95810 Presence of automatic (implantable) cardiac defibrillator: Secondary | ICD-10-CM

## 2021-04-08 DIAGNOSIS — Z79899 Other long term (current) drug therapy: Secondary | ICD-10-CM

## 2021-04-08 DIAGNOSIS — I130 Hypertensive heart and chronic kidney disease with heart failure and stage 1 through stage 4 chronic kidney disease, or unspecified chronic kidney disease: Principal | ICD-10-CM | POA: Diagnosis present

## 2021-04-08 DIAGNOSIS — Z7982 Long term (current) use of aspirin: Secondary | ICD-10-CM

## 2021-04-08 DIAGNOSIS — Z951 Presence of aortocoronary bypass graft: Secondary | ICD-10-CM

## 2021-04-08 DIAGNOSIS — Z20822 Contact with and (suspected) exposure to covid-19: Secondary | ICD-10-CM | POA: Diagnosis present

## 2021-04-08 DIAGNOSIS — F419 Anxiety disorder, unspecified: Secondary | ICD-10-CM | POA: Diagnosis present

## 2021-04-08 DIAGNOSIS — Z8249 Family history of ischemic heart disease and other diseases of the circulatory system: Secondary | ICD-10-CM

## 2021-04-08 DIAGNOSIS — J449 Chronic obstructive pulmonary disease, unspecified: Secondary | ICD-10-CM | POA: Diagnosis present

## 2021-04-08 DIAGNOSIS — I5023 Acute on chronic systolic (congestive) heart failure: Secondary | ICD-10-CM | POA: Diagnosis present

## 2021-04-08 DIAGNOSIS — Z9851 Tubal ligation status: Secondary | ICD-10-CM

## 2021-04-08 DIAGNOSIS — Z85118 Personal history of other malignant neoplasm of bronchus and lung: Secondary | ICD-10-CM

## 2021-04-08 DIAGNOSIS — Z955 Presence of coronary angioplasty implant and graft: Secondary | ICD-10-CM

## 2021-04-08 DIAGNOSIS — I252 Old myocardial infarction: Secondary | ICD-10-CM

## 2021-04-08 DIAGNOSIS — Z9981 Dependence on supplemental oxygen: Secondary | ICD-10-CM

## 2021-04-08 DIAGNOSIS — N1831 Chronic kidney disease, stage 3a: Secondary | ICD-10-CM | POA: Diagnosis present

## 2021-04-08 DIAGNOSIS — E1122 Type 2 diabetes mellitus with diabetic chronic kidney disease: Secondary | ICD-10-CM | POA: Diagnosis present

## 2021-04-08 DIAGNOSIS — E876 Hypokalemia: Secondary | ICD-10-CM | POA: Diagnosis present

## 2021-04-08 DIAGNOSIS — Z87891 Personal history of nicotine dependence: Secondary | ICD-10-CM

## 2021-04-08 DIAGNOSIS — I509 Heart failure, unspecified: Secondary | ICD-10-CM

## 2021-04-08 DIAGNOSIS — I5021 Acute systolic (congestive) heart failure: Secondary | ICD-10-CM | POA: Diagnosis present

## 2021-04-08 LAB — CBC WITH DIFFERENTIAL/PLATELET
Abs Immature Granulocytes: 0.03 10*3/uL (ref 0.00–0.07)
Basophils Absolute: 0.1 10*3/uL (ref 0.0–0.1)
Basophils Relative: 1 %
Eosinophils Absolute: 0.2 10*3/uL (ref 0.0–0.5)
Eosinophils Relative: 2 %
HCT: 45 % (ref 36.0–46.0)
Hemoglobin: 14.3 g/dL (ref 12.0–15.0)
Immature Granulocytes: 0 %
Lymphocytes Relative: 15 %
Lymphs Abs: 1.4 10*3/uL (ref 0.7–4.0)
MCH: 30.6 pg (ref 26.0–34.0)
MCHC: 31.8 g/dL (ref 30.0–36.0)
MCV: 96.2 fL (ref 80.0–100.0)
Monocytes Absolute: 1 10*3/uL (ref 0.1–1.0)
Monocytes Relative: 10 %
Neutro Abs: 6.9 10*3/uL (ref 1.7–7.7)
Neutrophils Relative %: 72 %
Platelets: 218 10*3/uL (ref 150–400)
RBC: 4.68 MIL/uL (ref 3.87–5.11)
RDW: 21 % — ABNORMAL HIGH (ref 11.5–15.5)
WBC: 9.7 10*3/uL (ref 4.0–10.5)
nRBC: 0 % (ref 0.0–0.2)

## 2021-04-08 MED ORDER — FUROSEMIDE 10 MG/ML IJ SOLN
40.0000 mg | Freq: Once | INTRAMUSCULAR | Status: AC
  Administered 2021-04-08: 40 mg via INTRAVENOUS
  Filled 2021-04-08: qty 4

## 2021-04-08 NOTE — ED Triage Notes (Signed)
Patient with history of CHF, states that she has had a 3 lb weight gain.  Patient is on 2L oxygen via [place] chronically.  Do to her shortness of breath she increased it to three.  She denies any chest pain at this time.  She did take double of her torsemide today, which did not help her.

## 2021-04-08 NOTE — ED Provider Notes (Signed)
[facility] EMERGENCY DEPARTMENT Provider Note   CSN: [id number] Arrival date & time: 04/08/21  2155     History Chief Complaint  Patient presents with  . Shortness of Breath    Brittany [name] is a 60 y.o. female.  Pt presents to the ED today with sob.  The pt has a hx of CHF.  Last ECHO in March showed an EF of less than 20%.  She is up 4 lbs.  She did have her ICD interrogated 2 days ago which did show evidence of fluid overload.  She has been taking extra Torsemide which has not been helping.  Pt is normally on 2 L oxygen prn.  She has had to wear it constantly and go up to 3L. Pt said she is extremely sob with very little movement.  No f/c.  No cp.        Past Medical History:  Diagnosis Date  . Acute systolic congestive heart failure ([place]) 02/03/2018  . Allergy   . Anxiety   . Asthma   . DM2 (diabetes mellitus, type 2) ([place]) 10/20/2020  . Hypertension   . PAF (paroxysmal atrial fibrillation) ([place])   . Prophylactic measure 08/03/14-08/19/14   Prophyl. cranial radiation 24 Gy  . 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 Small cell lung cancer ([place]) 03/16/2014    Patient Active Problem List   Diagnosis Date Noted  . Acute on chronic systolic (congestive) heart failure ([place]) 04/09/2021  . Acute systolic (congestive) heart failure ([place]) 04/09/2021  . Unstable angina ([place]) 02/09/2021  . COPD with acute exacerbation ([place]) 10/21/2020  . Hemoptysis 10/20/2020  . DM2 (diabetes mellitus, type 2) ([place]) 10/20/2020  . Acute on chronic systolic and diastolic heart failure, NYHA class 3 ([place]) 05/21/2020  . Ischemic cardiomyopathy 04/12/2020  . 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 NSTEMI (non-ST elevated myocardial infarction) ([place]) 02/12/2019  . CHF (congestive heart failure) ([place]) 11/24/2018  . 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 Acute respiratory failure ([place]) 05/05/2018  . Dyspnea 04/19/2018  . Chronic respiratory failure with hypoxia ([place])   . Acute on chronic systolic CHF (congestive heart failure) ([place]) 02/03/2018  . S/P CABG (coronary artery bypass graft) 02/03/2018  . Asthma   . Anxiety   . Allergy   . HCAP (healthcare-associated pneumonia) 03/15/2015  . Chest pain 03/15/2015  . Small cell lung cancer ([place]) 03/16/2014  . Protein-calorie malnutrition, severe ([place]) 03/14/2014  . Atrial fibrillation ([place]) 03/12/2014  . HTN (hypertension) 05/07/2012    Past Surgical History:  Procedure Laterality Date  . BRONCHIAL BRUSHINGS  10/25/2020   Procedure: BRONCHIAL BRUSHINGS;  Surgeon: Collene Gobble, MD;  Location: [facility] ENDOSCOPY;  Service: Pulmonary;;  . BRONCHIAL NEEDLE ASPIRATION BIOPSY  10/25/2020   Procedure: BRONCHIAL NEEDLE ASPIRATION BIOPSIES;  Surgeon: Collene Gobble, MD;  Location: [facility] ENDOSCOPY;  Service: Pulmonary;;  . CARDIAC DEFIBRILLATOR PLACEMENT  08/15/2018   MDT Visia AF MRI VR ICD implanted by Dr Loel Lofty for primary prevention of sudden  . CESAREAN SECTION    . CORONARY ARTERY BYPASS GRAFT N/A 02/03/2018   Procedure: CORONARY ARTERY BYPASS GRAFTING (CABG);  Surgeon: Rexene Alberts, MD;  Location: [place];  Service: Open Heart Surgery;  Laterality: N/A;  Time 3 using left internal mammary artery and endoscopically harvested right saphenous vein  .  CORONARY BALLOON ANGIOPLASTY N/A 02/03/2018   Procedure: CORONARY BALLOON ANGIOPLASTY;  Surgeon: Martinique, Peter M, MD;  Location: [place] CV LAB;  Service: Cardiovascular;  Laterality: N/A;  . CORONARY STENT INTERVENTION N/A 02/12/2019   Procedure: CORONARY STENT INTERVENTION;  Surgeon: Wellington Hampshire, MD;  Location: [place] CV LAB;  Service: Cardiovascular;  Laterality: N/A;  . CORONARY/GRAFT ACUTE MI REVASCULARIZATION N/A 02/03/2018   Procedure: Coronary/Graft Acute MI Revascularization;  Surgeon: Martinique,  Peter M, MD;  Location: [place] CV LAB;  Service: Cardiovascular;  Laterality: N/A;  . ENDOBRONCHIAL ULTRASOUND N/A 10/25/2020   Procedure: ENDOBRONCHIAL ULTRASOUND;  Surgeon: Collene Gobble, MD;  Location: [facility] ENDOSCOPY;  Service: Pulmonary;  Laterality: N/A;  . FLEXIBLE BRONCHOSCOPY  10/25/2020   Procedure: FLEXIBLE BRONCHOSCOPY;  Surgeon: Collene Gobble, MD;  Location: [facility] ENDOSCOPY;  Service: Pulmonary;;  . IABP INSERTION N/A 02/03/2018   Procedure: IABP Insertion;  Surgeon: Martinique, Peter M, MD;  Location: [place] CV LAB;  Service: Cardiovascular;  Laterality: N/A;  . INTRAOPERATIVE TRANSESOPHAGEAL ECHOCARDIOGRAM N/A 02/03/2018   Procedure: INTRAOPERATIVE TRANSESOPHAGEAL ECHOCARDIOGRAM;  Surgeon: Rexene Alberts, MD;  Location: [place];  Service: Open Heart Surgery;  Laterality: N/A;  . LEFT HEART CATH AND CORONARY ANGIOGRAPHY N/A 02/03/2018   Procedure: LEFT HEART CATH AND CORONARY ANGIOGRAPHY;  Surgeon: Martinique, Peter M, MD;  Location: [place] CV LAB;  Service: Cardiovascular;  Laterality: N/A;  . LEFT HEART CATH AND CORS/GRAFTS ANGIOGRAPHY N/A 02/12/2019   Procedure: LEFT HEART CATH AND CORS/GRAFTS ANGIOGRAPHY;  Surgeon: Wellington Hampshire, MD;  Location: [place] CV LAB;  Service: Cardiovascular;  Laterality: N/A;  . MEDIASTINOSCOPY N/A 03/11/2014   Procedure: MEDIASTINOSCOPY;  Surgeon: Melrose Nakayama, MD;  Location: [place];  Service: Thoracic;  Laterality: N/A;  . PLACEMENT OF [place] LEFT VENTRICULAR ASSIST DEVICE  02/03/2018   Procedure: PLACEMENT OF [place] LEFT VENTRICULAR ASSIST DEVICE LD;  Surgeon: Rexene Alberts, MD;  Location: [place];  Service: Open Heart Surgery;;  . REMOVAL OF IMPELLA LEFT VENTRICULAR ASSIST DEVICE N/A 02/08/2018   Procedure: REMOVAL OF [place] LEFT VENTRICULAR ASSIST DEVICE;  Surgeon: Rexene Alberts, MD;  Location: [place];  Service: Open Heart Surgery;  Laterality: N/A;  . RIGHT HEART CATH N/A 02/03/2018   Procedure: RIGHT HEART CATH;  Surgeon: Martinique,  Peter M, MD;  Location: [place] CV LAB;  Service: Cardiovascular;  Laterality: N/A;  . RIGHT HEART CATH N/A 05/09/2018   Procedure: RIGHT HEART CATH;  Surgeon: Jolaine Artist, MD;  Location: [place] CV LAB;  Service: Cardiovascular;  Laterality: N/A;  . TEE WITHOUT CARDIOVERSION N/A 02/08/2018   Procedure: TRANSESOPHAGEAL ECHOCARDIOGRAM (TEE);  Surgeon: Rexene Alberts, MD;  Location: [place];  Service: Open Heart Surgery;  Laterality: N/A;  . TUBAL LIGATION    . VIDEO BRONCHOSCOPY WITH ENDOBRONCHIAL ULTRASOUND N/A 03/11/2014   Procedure: VIDEO BRONCHOSCOPY WITH ENDOBRONCHIAL ULTRASOUND;  Surgeon: Melrose Nakayama, MD;  Location: [place];  Service: Thoracic;  Laterality: N/A;     OB History   No obstetric history on file.     Family History  Problem Relation Age of Onset  . Heart disease Mother   . Hypertension Mother   . Heart attack Mother   . Hypertension Maternal Grandmother   . Cancer Maternal Grandmother   . Diabetes Paternal Grandmother   . Stroke Neg Hx     Social History   Tobacco Use  . Smoking status: Former Smoker    Packs/day: 1.00  Years: 20.00    Pack years: 20.00    Types: Cigarettes    Quit date: 03/13/2014    Years since quitting: 7.0  . Smokeless tobacco: Never Used  Vaping Use  . Vaping Use: Never used  Substance Use Topics  . Alcohol use: Yes    Alcohol/week: 8.0 standard drinks    Types: 8 Glasses of wine per week  . Drug use: Yes    Types: Marijuana    Comment: "medicinal"    Home Medications Prior to Admission medications   Medication Sig Start Date End Date Taking? Authorizing Provider  albuterol (VENTOLIN HFA) 108 (90 Base) MCG/ACT inhaler Inhale 2 puffs into the lungs every 6 (six) hours as needed for wheezing or shortness of breath. 04/16/19  Yes Bensimhon, Shaune Pascal, MD  ALPRAZolam Duanne Moron) 0.25 MG tablet Take 1 tablet (0.25 mg total) by mouth daily as needed for anxiety. 02/24/19  Yes Shirley Friar, PA-C  aspirin EC 81  MG tablet Take 1 tablet (81 mg total) by mouth daily. Swallow whole. 11/10/20  Yes Bensimhon, Shaune Pascal, MD  atorvastatin (LIPITOR) 80 MG tablet Take 1 tablet (80 mg total) by mouth daily at 6 PM. Patient taking differently: Take 80 mg by mouth every evening. 07/03/18  Yes Bensimhon, Shaune Pascal, MD  blood glucose meter kit and supplies KIT Dispense based on patient and insurance preference. Use up to four times daily as directed. 04/11/21  Yes Bonnielee Haff, MD  Blood Pressure Monitoring (BLOOD PRESSURE CUFF) MISC Use as directed 04/11/21  Yes Bonnielee Haff, MD  dapagliflozin propanediol (FARXIGA) 10 MG TABS tablet Take 1 tablet (10 mg total) by mouth daily before breakfast. 06/11/20  Yes Bensimhon, Shaune Pascal, MD  feeding supplement (ENSURE ENLIVE / ENSURE PLUS) LIQD Take 237 mLs by mouth 3 (three) times daily between meals. Patient taking differently: Take 237 mLs by mouth daily. 10/26/20  Yes Oretha Milch D, MD  ipratropium-albuterol (DUONEB) 0.5-2.5 (3) MG/3ML SOLN Take 3 mLs by nebulization 2 (two) times daily as needed (for shortness of breath).   Yes [provider]  ivabradine (CORLANOR) 7.5 MG TABS tablet Take 1 tablet (7.5 mg total) by mouth 2 (two) times daily with a meal. 11/19/20  Yes Bensimhon, Shaune Pascal, MD  metolazone (ZAROXOLYN) 2.5 MG tablet Take 1 tablet (2.5 mg total) by mouth as directed. Take today and tomorrow and then hold till directed Patient taking differently: Take 2.5 mg by mouth daily as needed (fluid). 11/10/20  Yes Bensimhon, Shaune Pascal, MD  nitroGLYCERIN (NITROSTAT) 0.4 MG SL tablet Place 1 tablet (0.4 mg total) under the tongue every 5 (five) minutes as needed for chest pain. 02/14/19  Yes Shirley Friar, PA-C  potassium chloride SA (KLOR-CON) 20 MEQ tablet Take 2 tablets (40 mEq total) by mouth daily. Take 77mq (2 tablets) with metolazone doses 01/31/21  Yes Simmons, Brittainy M, PA-C  spironolactone (ALDACTONE) 25 MG tablet Take 0.5 tablets (12.5 mg total) by  mouth daily. 03/01/21  Yes Clegg, Amy D, NP  torsemide (DEMADEX) 20 MG tablet Take 873m(4 tablets) once daily for 4 days and then resume usual dose of 6044maily. 04/11/21   KriBonnielee HaffD    Allergies    Codeine  Review of Systems   Review of Systems  Respiratory: Positive for shortness of breath.   All other systems reviewed and are negative.   Physical Exam Updated Vital Signs BP 101/68   Pulse 88   Temp 98.2 F (36.8 C) (Oral)  Resp 18   Ht 5' (1.524 m)   Wt 48.2 kg Comment: scale b  SpO2 92%   BMI 20.74 kg/m   Physical Exam Vitals and nursing note reviewed.  Constitutional:      Appearance: She is well-developed.  HENT:     Head: Normocephalic and atraumatic.     Mouth/Throat:     Mouth: Mucous membranes are moist.     Pharynx: Oropharynx is clear.  Eyes:     Extraocular Movements: Extraocular movements intact.     Pupils: Pupils are equal, round, and reactive to light.  Cardiovascular:     Rate and Rhythm: Normal rate and regular rhythm.  Pulmonary:     Effort: Tachypnea present.     Breath sounds: Rhonchi present.  Abdominal:     General: Bowel sounds are normal.     Palpations: Abdomen is soft.  Musculoskeletal:        General: Normal range of motion.     Cervical back: Normal range of motion and neck supple.  Skin:    General: Skin is warm.     Capillary Refill: Capillary refill takes less than 2 seconds.  Neurological:     General: No focal deficit present.     Mental Status: She is alert and oriented to person, place, and time.  Psychiatric:        Mood and Affect: Mood normal.        Behavior: Behavior normal.     ED Results / Procedures / Treatments   Labs (all labs ordered are listed, but only abnormal results are displayed) Labs Reviewed  BASIC METABOLIC PANEL - Abnormal; Notable for the following components:      Result Value   Potassium 3.1 (*)    Glucose, Bld 127 (*)    Creatinine, Ser 1.23 (*)    GFR, Estimated 50 (*)     All other components within normal limits  CBC WITH DIFFERENTIAL/PLATELET - Abnormal; Notable for the following components:   RDW 21.0 (*)    All other components within normal limits  BRAIN NATRIURETIC PEPTIDE - Abnormal; Notable for the following components:   B Natriuretic Peptide 1,310.5 (*)    All other components within normal limits  CBC - Abnormal; Notable for the following components:   RDW 20.8 (*)    All other components within normal limits  HEMOGLOBIN A1C - Abnormal; Notable for the following components:   Hgb A1c MFr Bld 6.5 (*)    All other components within normal limits  GLUCOSE, CAPILLARY - Abnormal; Notable for the following components:   Glucose-Capillary 213 (*)    All other components within normal limits  BASIC METABOLIC PANEL - Abnormal; Notable for the following components:   Glucose, Bld 164 (*)    All other components within normal limits  GLUCOSE, CAPILLARY - Abnormal; Notable for the following components:   Glucose-Capillary 275 (*)    All other components within normal limits  GLUCOSE, CAPILLARY - Abnormal; Notable for the following components:   Glucose-Capillary 214 (*)    All other components within normal limits  BASIC METABOLIC PANEL - Abnormal; Notable for the following components:   Chloride 96 (*)    Glucose, Bld 166 (*)    BUN 35 (*)    Creatinine, Ser 1.31 (*)    GFR, Estimated 47 (*)    All other components within normal limits  CBC - Abnormal; Notable for the following components:   RDW 20.2 (*)    All  other components within normal limits  GLUCOSE, CAPILLARY - Abnormal; Notable for the following components:   Glucose-Capillary 186 (*)    All other components within normal limits  GLUCOSE, CAPILLARY - Abnormal; Notable for the following components:   Glucose-Capillary 147 (*)    All other components within normal limits  GLUCOSE, CAPILLARY - Abnormal; Notable for the following components:   Glucose-Capillary 202 (*)    All other  components within normal limits  GLUCOSE, CAPILLARY - Abnormal; Notable for the following components:   Glucose-Capillary 145 (*)    All other components within normal limits  BASIC METABOLIC PANEL - Abnormal; Notable for the following components:   Chloride 94 (*)    Glucose, Bld 155 (*)    BUN 33 (*)    Creatinine, Ser 1.12 (*)    Calcium 10.9 (*)    GFR, Estimated 56 (*)    All other components within normal limits  GLUCOSE, CAPILLARY - Abnormal; Notable for the following components:   Glucose-Capillary 211 (*)    All other components within normal limits  GLUCOSE, CAPILLARY - Abnormal; Notable for the following components:   Glucose-Capillary 170 (*)    All other components within normal limits  TROPONIN I (HIGH SENSITIVITY) - Abnormal; Notable for the following components:   Troponin I (High Sensitivity) 23 (*)    All other components within normal limits  TROPONIN I (HIGH SENSITIVITY) - Abnormal; Notable for the following components:   Troponin I (High Sensitivity) 19 (*)    All other components within normal limits  RESP PANEL BY RT-PCR (FLU A&B, COVID) ARPGX2  MAGNESIUM  PHOSPHORUS  MAGNESIUM    EKG EKG Interpretation  Date/Time:  Friday April 08 2021 21:59:41 EDT Ventricular Rate:  80 PR Interval:  158 QRS Duration: 105 QT Interval:  430 QTC Calculation: 497 R Axis:   127 Text Interpretation: Sinus rhythm Biatrial enlargement Right axis deviation Consider left ventricular hypertrophy Nonspecific T abnormalities, lateral leads ST elevation, consider inferior injury Borderline prolonged QT interval No significant change since last tracing Confirmed by Isla Pence [phone]) on 04/08/2021 10:29:59 PM Also confirmed by Isla Pence [phone]), editor Hattie Perch ([id number])  on 04/09/2021 3:06:06 PM   Radiology No results found.  Procedures Procedures   Medications Ordered in ED Medications  albuterol (VENTOLIN HFA) 108 (90 Base) MCG/ACT inhaler 2 puff (has  no administration in time range)  ALPRAZolam (XANAX) tablet 0.25 mg (0.125 mg Oral Given 04/10/21 1515)  aspirin EC tablet 81 mg (81 mg Oral Given 04/11/21 0931)  atorvastatin (LIPITOR) tablet 80 mg (80 mg Oral Given 04/10/21 1732)  feeding supplement (ENSURE ENLIVE / ENSURE PLUS) liquid 237 mL (237 mLs Oral Given 04/11/21 1047)  ipratropium-albuterol (DUONEB) 0.5-2.5 (3) MG/3ML nebulizer solution 3 mL (has no administration in time range)  ivabradine (CORLANOR) tablet 7.5 mg (7.5 mg Oral Given 04/11/21 0931)  spironolactone (ALDACTONE) tablet 12.5 mg (12.5 mg Oral Given 04/11/21 0931)  potassium chloride SA (KLOR-CON) CR tablet 40 mEq (40 mEq Oral Given 04/11/21 0931)  insulin aspart (novoLOG) injection 0-9 Units (2 Units Subcutaneous Given 04/11/21 0614)  insulin aspart (novoLOG) injection 0-5 Units (2 Units Subcutaneous Given 04/10/21 2143)  furosemide (LASIX) injection 40 mg (40 mg Intravenous Given 04/11/21 0931)  dapagliflozin propanediol (FARXIGA) tablet 10 mg (10 mg Oral Given 04/11/21 0614)  enoxaparin (LOVENOX) injection 40 mg (40 mg Subcutaneous Given 04/10/21 1510)  guaiFENesin (ROBITUSSIN) 100 MG/5ML solution 100 mg (100 mg Oral Given 04/10/21 1014)  furosemide (LASIX) injection 40  mg (40 mg Intravenous Given 04/08/21 2338)  potassium chloride SA (KLOR-CON) CR tablet 40 mEq (40 mEq Oral Given 04/09/21 1030)  methylPREDNISolone sodium succinate (SOLU-MEDROL) 40 mg/mL injection 40 mg (40 mg Intravenous Given 04/09/21 0227)  magnesium sulfate IVPB 2 g 50 mL (0 g Intravenous Stopped 04/09/21 1446)  metolazone (ZAROXOLYN) tablet 5 mg (5 mg Oral Given 04/10/21 1012)    ED Course  I have reviewed the triage vital signs and the nursing notes.  Pertinent labs & imaging results that were available during my care of the patient were reviewed by me and considered in my medical decision making (see chart for details).    MDM Rules/Calculators/A&P                          Pt given a dose of IV lasix in the ED.  Labs  and CXR pending at shift change.  Pt signed out to Dr. Stark Jock.  Pt will need admission due to failure of outpatient treatment and need for diuresis.  Final Clinical Impression(s) / ED Diagnoses Final diagnoses:  Acute on chronic congestive heart failure, unspecified heart failure type ([place])    Rx / DC Orders ED Discharge Orders         Ordered    torsemide (DEMADEX) 20 MG tablet       Note to Pharmacy: Please cancel all previous orders for current medication. Change in dosage or pill size.   04/11/21 1000    Diet - low sodium heart healthy        04/11/21 1000    Increase activity slowly        04/11/21 1000    Heart Failure patients record your daily weight using the same scale at the same time of day        04/11/21 1000    Call MD for:  temperature >100.4        04/11/21 1000    Call MD for:  persistant nausea and vomiting        04/11/21 1000    Call MD for:  severe uncontrolled pain        04/11/21 1000    Call MD for:  difficulty breathing, headache or visual disturbances        04/11/21 1000    Call MD for:  persistant dizziness or light-headedness        04/11/21 1000    Call MD for:  extreme fatigue        04/11/21 1000    (HEART FAILURE PATIENTS) Call MD:  Anytime you have any of the following symptoms: 1) 3 pound weight gain in 24 hours or 5 pounds in 1 week 2) shortness of breath, with or without a dry hacking cough 3) swelling in the hands, feet or stomach 4) if you have to sleep on extra pillows at night in order to breathe.        04/11/21 1000    AMB referral to CHF clinic       Comments: Needs post hospital stay f/u in 1 week. thanks   04/11/21 1000    blood glucose meter kit and supplies KIT        04/11/21 1020    Blood Pressure Monitoring (BLOOD PRESSURE CUFF) MISC        04/11/21 [place], Paige Monarrez, MD 04/11/21 1539

## 2021-04-09 ENCOUNTER — Encounter (HOSPITAL_COMMUNITY): Payer: Self-pay | Admitting: Internal Medicine

## 2021-04-09 ENCOUNTER — Other Ambulatory Visit: Payer: Self-pay

## 2021-04-09 DIAGNOSIS — N1832 Chronic kidney disease, stage 3b: Secondary | ICD-10-CM | POA: Diagnosis not present

## 2021-04-09 DIAGNOSIS — Z9851 Tubal ligation status: Secondary | ICD-10-CM | POA: Diagnosis not present

## 2021-04-09 DIAGNOSIS — F419 Anxiety disorder, unspecified: Secondary | ICD-10-CM | POA: Diagnosis present

## 2021-04-09 DIAGNOSIS — Z955 Presence of coronary angioplasty implant and graft: Secondary | ICD-10-CM | POA: Diagnosis not present

## 2021-04-09 DIAGNOSIS — N1831 Chronic kidney disease, stage 3a: Secondary | ICD-10-CM | POA: Diagnosis present

## 2021-04-09 DIAGNOSIS — Z885 Allergy status to narcotic agent status: Secondary | ICD-10-CM | POA: Diagnosis not present

## 2021-04-09 DIAGNOSIS — E1165 Type 2 diabetes mellitus with hyperglycemia: Secondary | ICD-10-CM | POA: Diagnosis present

## 2021-04-09 DIAGNOSIS — I252 Old myocardial infarction: Secondary | ICD-10-CM | POA: Diagnosis not present

## 2021-04-09 DIAGNOSIS — J449 Chronic obstructive pulmonary disease, unspecified: Secondary | ICD-10-CM | POA: Diagnosis present

## 2021-04-09 DIAGNOSIS — Z95810 Presence of automatic (implantable) cardiac defibrillator: Secondary | ICD-10-CM | POA: Diagnosis not present

## 2021-04-09 DIAGNOSIS — Z8249 Family history of ischemic heart disease and other diseases of the circulatory system: Secondary | ICD-10-CM | POA: Diagnosis not present

## 2021-04-09 DIAGNOSIS — Z951 Presence of aortocoronary bypass graft: Secondary | ICD-10-CM | POA: Diagnosis not present

## 2021-04-09 DIAGNOSIS — I130 Hypertensive heart and chronic kidney disease with heart failure and stage 1 through stage 4 chronic kidney disease, or unspecified chronic kidney disease: Secondary | ICD-10-CM | POA: Diagnosis present

## 2021-04-09 DIAGNOSIS — Z85118 Personal history of other malignant neoplasm of bronchus and lung: Secondary | ICD-10-CM | POA: Diagnosis not present

## 2021-04-09 DIAGNOSIS — Z20822 Contact with and (suspected) exposure to covid-19: Secondary | ICD-10-CM | POA: Diagnosis present

## 2021-04-09 DIAGNOSIS — Z79899 Other long term (current) drug therapy: Secondary | ICD-10-CM | POA: Diagnosis not present

## 2021-04-09 DIAGNOSIS — I5023 Acute on chronic systolic (congestive) heart failure: Secondary | ICD-10-CM

## 2021-04-09 DIAGNOSIS — Z87891 Personal history of nicotine dependence: Secondary | ICD-10-CM | POA: Diagnosis not present

## 2021-04-09 DIAGNOSIS — Z7982 Long term (current) use of aspirin: Secondary | ICD-10-CM | POA: Diagnosis not present

## 2021-04-09 DIAGNOSIS — E876 Hypokalemia: Secondary | ICD-10-CM | POA: Diagnosis present

## 2021-04-09 DIAGNOSIS — I5021 Acute systolic (congestive) heart failure: Secondary | ICD-10-CM | POA: Diagnosis present

## 2021-04-09 DIAGNOSIS — Z9981 Dependence on supplemental oxygen: Secondary | ICD-10-CM | POA: Diagnosis not present

## 2021-04-09 DIAGNOSIS — E1122 Type 2 diabetes mellitus with diabetic chronic kidney disease: Secondary | ICD-10-CM | POA: Diagnosis present

## 2021-04-09 LAB — GLUCOSE, CAPILLARY
Glucose-Capillary: 213 mg/dL — ABNORMAL HIGH (ref 70–99)
Glucose-Capillary: 275 mg/dL — ABNORMAL HIGH (ref 70–99)
Glucose-Capillary: 214 mg/dL — ABNORMAL HIGH (ref 70–99)
Glucose-Capillary: 186 mg/dL — ABNORMAL HIGH (ref 70–99)

## 2021-04-09 LAB — HEMOGLOBIN A1C
Hgb A1c MFr Bld: 6.5 % — ABNORMAL HIGH (ref 4.8–5.6)
Mean Plasma Glucose: 139.85 mg/dL

## 2021-04-09 LAB — BASIC METABOLIC PANEL
Anion gap: 7 (ref 5–15)
Anion gap: 14 (ref 5–15)
BUN: 20 mg/dL (ref 6–20)
BUN: 19 mg/dL (ref 6–20)
CO2: 30 mmol/L (ref 22–32)
CO2: 23 mmol/L (ref 22–32)
Calcium: 9.5 mg/dL (ref 8.9–10.3)
Calcium: 9.4 mg/dL (ref 8.9–10.3)
Chloride: 101 mmol/L (ref 98–111)
Chloride: 101 mmol/L (ref 98–111)
Creatinine, Ser: 1.23 mg/dL — ABNORMAL HIGH (ref 0.44–1.00)
Creatinine, Ser: 0.96 mg/dL (ref 0.44–1.00)
GFR, Estimated: 50 mL/min — ABNORMAL LOW (ref >60)
GFR, Estimated: >60 mL/min (ref >60)
Glucose, Bld: 127 mg/dL — ABNORMAL HIGH (ref 70–99)
Glucose, Bld: 164 mg/dL — ABNORMAL HIGH (ref 70–99)
Potassium: 3.1 mmol/L — ABNORMAL LOW (ref 3.5–5.1)
Potassium: 3.7 mmol/L (ref 3.5–5.1)
Sodium: 138 mmol/L (ref 135–145)
Sodium: 138 mmol/L (ref 135–145)

## 2021-04-09 LAB — TROPONIN I (HIGH SENSITIVITY)
Troponin I (High Sensitivity): 23 ng/L — ABNORMAL HIGH (ref <18)
Troponin I (High Sensitivity): 19 ng/L — ABNORMAL HIGH (ref <18)

## 2021-04-09 LAB — MAGNESIUM: Magnesium: 1.8 mg/dL (ref 1.7–2.4)

## 2021-04-09 LAB — CBC
HCT: 44.4 % (ref 36.0–46.0)
Hemoglobin: 14.2 g/dL (ref 12.0–15.0)
MCH: 30.7 pg (ref 26.0–34.0)
MCHC: 32 g/dL (ref 30.0–36.0)
MCV: 96.1 fL (ref 80.0–100.0)
Platelets: 214 10*3/uL (ref 150–400)
RBC: 4.62 MIL/uL (ref 3.87–5.11)
RDW: 20.8 % — ABNORMAL HIGH (ref 11.5–15.5)
WBC: 10.4 10*3/uL (ref 4.0–10.5)
nRBC: 0 % (ref 0.0–0.2)

## 2021-04-09 LAB — RESP PANEL BY RT-PCR (FLU A&B, COVID) ARPGX2
Influenza A by PCR: NEGATIVE
Influenza B by PCR: NEGATIVE
SARS Coronavirus 2 by RT PCR: NEGATIVE

## 2021-04-09 LAB — BRAIN NATRIURETIC PEPTIDE: B Natriuretic Peptide: 1310.5 pg/mL — ABNORMAL HIGH (ref 0.0–100.0)

## 2021-04-09 LAB — PHOSPHORUS: Phosphorus: 3.4 mg/dL (ref 2.5–4.6)

## 2021-04-09 MED ORDER — SPIRONOLACTONE 12.5 MG HALF TABLET
12.5000 mg | ORAL_TABLET | Freq: Every day | ORAL | Status: DC
  Administered 2021-04-09 – 2021-04-11 (×3): 12.5 mg via ORAL
  Filled 2021-04-09 (×3): qty 1

## 2021-04-09 MED ORDER — IVABRADINE HCL 7.5 MG PO TABS
7.5000 mg | ORAL_TABLET | Freq: Two times a day (BID) | ORAL | Status: DC
  Administered 2021-04-09 – 2021-04-11 (×5): 7.5 mg via ORAL
  Filled 2021-04-09 (×6): qty 1

## 2021-04-09 MED ORDER — POTASSIUM CHLORIDE CRYS ER 20 MEQ PO TBCR
40.0000 meq | EXTENDED_RELEASE_TABLET | Freq: Two times a day (BID) | ORAL | Status: AC
  Administered 2021-04-09 (×2): 40 meq via ORAL
  Filled 2021-04-09 (×2): qty 2

## 2021-04-09 MED ORDER — FUROSEMIDE 10 MG/ML IJ SOLN
40.0000 mg | Freq: Two times a day (BID) | INTRAMUSCULAR | Status: DC
  Administered 2021-04-09 – 2021-04-11 (×5): 40 mg via INTRAVENOUS
  Filled 2021-04-09 (×5): qty 4

## 2021-04-09 MED ORDER — INSULIN ASPART 100 UNIT/ML IJ SOLN
0.0000 [IU] | Freq: Three times a day (TID) | INTRAMUSCULAR | Status: DC
  Administered 2021-04-09 (×2): 3 [IU] via SUBCUTANEOUS
  Administered 2021-04-09: 5 [IU] via SUBCUTANEOUS
  Administered 2021-04-10: 3 [IU] via SUBCUTANEOUS
  Administered 2021-04-10: 1 [IU] via SUBCUTANEOUS
  Administered 2021-04-11: 2 [IU] via SUBCUTANEOUS

## 2021-04-09 MED ORDER — GUAIFENESIN 100 MG/5ML PO SOLN
5.0000 mL | ORAL | Status: DC | PRN
  Administered 2021-04-09 – 2021-04-10 (×3): 100 mg via ORAL
  Filled 2021-04-09 (×3): qty 5

## 2021-04-09 MED ORDER — POTASSIUM CHLORIDE CRYS ER 20 MEQ PO TBCR
40.0000 meq | EXTENDED_RELEASE_TABLET | Freq: Every day | ORAL | Status: DC
  Administered 2021-04-10 – 2021-04-11 (×2): 40 meq via ORAL
  Filled 2021-04-09 (×2): qty 2

## 2021-04-09 MED ORDER — IPRATROPIUM-ALBUTEROL 0.5-2.5 (3) MG/3ML IN SOLN: 3.0000 mL | Freq: Two times a day (BID) | RESPIRATORY_TRACT | Status: DC | PRN

## 2021-04-09 MED ORDER — ALPRAZOLAM 0.25 MG PO TABS
0.2500 mg | ORAL_TABLET | Freq: Every day | ORAL | Status: DC | PRN
  Administered 2021-04-10: 0.125 mg via ORAL
  Filled 2021-04-09: qty 1

## 2021-04-09 MED ORDER — INSULIN ASPART 100 UNIT/ML IJ SOLN
0.0000 [IU] | Freq: Every day | INTRAMUSCULAR | Status: DC
  Administered 2021-04-10: 2 [IU] via SUBCUTANEOUS

## 2021-04-09 MED ORDER — ALBUTEROL SULFATE HFA 108 (90 BASE) MCG/ACT IN AERS: 2.0000 | INHALATION_SPRAY | Freq: Four times a day (QID) | RESPIRATORY_TRACT | Status: DC | PRN

## 2021-04-09 MED ORDER — FUROSEMIDE 10 MG/ML IJ SOLN
20.0000 mg | Freq: Two times a day (BID) | INTRAMUSCULAR | Status: DC
  Administered 2021-04-09: 20 mg via INTRAVENOUS
  Filled 2021-04-09 (×2): qty 2

## 2021-04-09 MED ORDER — ENSURE ENLIVE PO LIQD
237.0000 mL | Freq: Three times a day (TID) | ORAL | Status: DC
  Administered 2021-04-09 – 2021-04-11 (×7): 237 mL via ORAL

## 2021-04-09 MED ORDER — DAPAGLIFLOZIN PROPANEDIOL 10 MG PO TABS
10.0000 mg | ORAL_TABLET | Freq: Every day | ORAL | Status: DC
  Administered 2021-04-10 – 2021-04-11 (×2): 10 mg via ORAL
  Filled 2021-04-09 (×2): qty 1

## 2021-04-09 MED ORDER — MAGNESIUM SULFATE 2 GM/50ML IV SOLN
2.0000 g | Freq: Once | INTRAVENOUS | Status: AC
  Administered 2021-04-09: 2 g via INTRAVENOUS
  Filled 2021-04-09: qty 50

## 2021-04-09 MED ORDER — ATORVASTATIN CALCIUM 80 MG PO TABS
80.0000 mg | ORAL_TABLET | Freq: Every evening | ORAL | Status: DC
  Administered 2021-04-09 – 2021-04-10 (×2): 80 mg via ORAL
  Filled 2021-04-09 (×2): qty 1

## 2021-04-09 MED ORDER — ENOXAPARIN SODIUM 40 MG/0.4ML IJ SOSY
40.0000 mg | PREFILLED_SYRINGE | INTRAMUSCULAR | Status: DC
  Administered 2021-04-09 – 2021-04-10 (×2): 40 mg via SUBCUTANEOUS
  Filled 2021-04-09 (×2): qty 0.4

## 2021-04-09 MED ORDER — POTASSIUM CHLORIDE CRYS ER 20 MEQ PO TBCR: 40.0000 meq | EXTENDED_RELEASE_TABLET | Freq: Every day | ORAL | Status: DC

## 2021-04-09 MED ORDER — IPRATROPIUM-ALBUTEROL 0.5-2.5 (3) MG/3ML IN SOLN
3.0000 mL | Freq: Four times a day (QID) | RESPIRATORY_TRACT | Status: DC
  Administered 2021-04-09 (×3): 3 mL via RESPIRATORY_TRACT
  Filled 2021-04-09 (×3): qty 3

## 2021-04-09 MED ORDER — METHYLPREDNISOLONE SODIUM SUCC 40 MG IJ SOLR
40.0000 mg | Freq: Once | INTRAMUSCULAR | Status: AC
  Administered 2021-04-09: 40 mg via INTRAVENOUS
  Filled 2021-04-09: qty 1

## 2021-04-09 MED ORDER — ASPIRIN EC 81 MG PO TBEC
81.0000 mg | DELAYED_RELEASE_TABLET | Freq: Every day | ORAL | Status: DC
  Administered 2021-04-09 – 2021-04-11 (×3): 81 mg via ORAL
  Filled 2021-04-09 (×3): qty 1

## 2021-04-09 NOTE — H&P (Signed)
History and Physical  Brittany [name] TOI:[id number] DOB: 01/20/1961 DOA: 04/08/2021  Referring physician: Dr. Stark Jock PCP: Maude Leriche, PA-C  Outpatient Specialists: Cardiology. Patient coming from: Home.  Chief Complaint: Shortness of breath x2 days and 5 pound weight gain.  HPI: Brittany [name] is a 60 y.o. female with medical history significant for chronic systolic CHF with LVEF less than 20%, asthma, COPD on 2 L nasal cannula as needed nightly, hyperlipidemia, type 2 diabetes, who presented to [facility] ED due to complaints of shortness of breath x2 days associated with weight gain 5 pounds.  She took extra doses of torsemide for the last 2 days with no improvement of her symptomatology.  She presented to the ED for further evaluation.  Diuresed in the ED however still tachypneic and dyspneic, she feels like she cannot get any air in.  EDP requested admission for diuresis.  ED Course:  Afebrile.  BP 121/73, pulse 76, respiration rate 25, with saturation 98% on 2 L.  Lab studies significant for serum potassium 3.1, serum bicarb 30, BUN 20, creatinine 1.23, GFR 50.  BNP 1300, troponin 23 repeated 19.  Twelve-lead EKG sinus rhythm rate of 80 nonspecific ST-T changes.  QTc 497.  Review of Systems: Review of systems as noted in the HPI. All other systems reviewed and are negative.   Past Medical History:  Diagnosis Date  . Acute systolic congestive heart failure ([place]) 02/03/2018  . Allergy   . Anxiety   . Asthma   . DM2 (diabetes mellitus, type 2) ([place]) 10/20/2020  . Hypertension   . PAF (paroxysmal atrial fibrillation) ([place])   . Prophylactic measure 08/03/14-08/19/14   Prophyl. cranial radiation 24 Gy  . 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 Small cell lung cancer ([place]) 03/16/2014   Past Surgical History:  Procedure Laterality Date  . BRONCHIAL BRUSHINGS   10/25/2020   Procedure: BRONCHIAL BRUSHINGS;  Surgeon: Collene Gobble, MD;  Location: [facility] ENDOSCOPY;  Service: Pulmonary;;  . BRONCHIAL NEEDLE ASPIRATION BIOPSY  10/25/2020   Procedure: BRONCHIAL NEEDLE ASPIRATION BIOPSIES;  Surgeon: Collene Gobble, MD;  Location: [facility] ENDOSCOPY;  Service: Pulmonary;;  . CARDIAC DEFIBRILLATOR PLACEMENT  08/15/2018   MDT Visia AF MRI VR ICD implanted by Dr Loel Lofty for primary prevention of sudden  . CESAREAN SECTION    . CORONARY ARTERY BYPASS GRAFT N/A 02/03/2018   Procedure: CORONARY ARTERY BYPASS GRAFTING (CABG);  Surgeon: Rexene Alberts, MD;  Location: [place];  Service: Open Heart Surgery;  Laterality: N/A;  Time 3 using left internal mammary artery and endoscopically harvested right saphenous vein  . CORONARY BALLOON ANGIOPLASTY N/A 02/03/2018   Procedure: CORONARY BALLOON ANGIOPLASTY;  Surgeon: Martinique, Peter M, MD;  Location: [place] CV LAB;  Service: Cardiovascular;  Laterality: N/A;  . CORONARY STENT INTERVENTION N/A 02/12/2019   Procedure: CORONARY STENT INTERVENTION;  Surgeon: Wellington Hampshire, MD;  Location: [place] CV LAB;  Service: Cardiovascular;  Laterality: N/A;  . CORONARY/GRAFT ACUTE MI REVASCULARIZATION N/A 02/03/2018   Procedure: Coronary/Graft Acute MI Revascularization;  Surgeon: Martinique, Peter M, MD;  Location: [place] CV LAB;  Service: Cardiovascular;  Laterality: N/A;  . ENDOBRONCHIAL ULTRASOUND N/A 10/25/2020   Procedure: ENDOBRONCHIAL ULTRASOUND;  Surgeon: Collene Gobble, MD;  Location: [facility] ENDOSCOPY;  Service: Pulmonary;  Laterality: N/A;  . FLEXIBLE BRONCHOSCOPY  10/25/2020   Procedure: FLEXIBLE  BRONCHOSCOPY;  Surgeon: Collene Gobble, MD;  Location: [facility] ENDOSCOPY;  Service: Pulmonary;;  . IABP INSERTION N/A 02/03/2018   Procedure: IABP Insertion;  Surgeon: Martinique, Peter M, MD;  Location: [place] CV LAB;  Service: Cardiovascular;  Laterality: N/A;  . INTRAOPERATIVE TRANSESOPHAGEAL ECHOCARDIOGRAM N/A 02/03/2018   Procedure:  INTRAOPERATIVE TRANSESOPHAGEAL ECHOCARDIOGRAM;  Surgeon: Rexene Alberts, MD;  Location: [place];  Service: Open Heart Surgery;  Laterality: N/A;  . LEFT HEART CATH AND CORONARY ANGIOGRAPHY N/A 02/03/2018   Procedure: LEFT HEART CATH AND CORONARY ANGIOGRAPHY;  Surgeon: Martinique, Peter M, MD;  Location: [place] CV LAB;  Service: Cardiovascular;  Laterality: N/A;  . LEFT HEART CATH AND CORS/GRAFTS ANGIOGRAPHY N/A 02/12/2019   Procedure: LEFT HEART CATH AND CORS/GRAFTS ANGIOGRAPHY;  Surgeon: Wellington Hampshire, MD;  Location: [place] CV LAB;  Service: Cardiovascular;  Laterality: N/A;  . MEDIASTINOSCOPY N/A 03/11/2014   Procedure: MEDIASTINOSCOPY;  Surgeon: Melrose Nakayama, MD;  Location: [place];  Service: Thoracic;  Laterality: N/A;  . PLACEMENT OF [place] LEFT VENTRICULAR ASSIST DEVICE  02/03/2018   Procedure: PLACEMENT OF [place] LEFT VENTRICULAR ASSIST DEVICE LD;  Surgeon: Rexene Alberts, MD;  Location: [place];  Service: Open Heart Surgery;;  . REMOVAL OF IMPELLA LEFT VENTRICULAR ASSIST DEVICE N/A 02/08/2018   Procedure: REMOVAL OF [place] LEFT VENTRICULAR ASSIST DEVICE;  Surgeon: Rexene Alberts, MD;  Location: [place];  Service: Open Heart Surgery;  Laterality: N/A;  . RIGHT HEART CATH N/A 02/03/2018   Procedure: RIGHT HEART CATH;  Surgeon: Martinique, Peter M, MD;  Location: [place] CV LAB;  Service: Cardiovascular;  Laterality: N/A;  . RIGHT HEART CATH N/A 05/09/2018   Procedure: RIGHT HEART CATH;  Surgeon: Jolaine Artist, MD;  Location: [place] CV LAB;  Service: Cardiovascular;  Laterality: N/A;  . TEE WITHOUT CARDIOVERSION N/A 02/08/2018   Procedure: TRANSESOPHAGEAL ECHOCARDIOGRAM (TEE);  Surgeon: Rexene Alberts, MD;  Location: [place];  Service: Open Heart Surgery;  Laterality: N/A;  . TUBAL LIGATION    . VIDEO BRONCHOSCOPY WITH ENDOBRONCHIAL ULTRASOUND N/A 03/11/2014   Procedure: VIDEO BRONCHOSCOPY WITH ENDOBRONCHIAL ULTRASOUND;  Surgeon: Melrose Nakayama, MD;  Location: [place];   Service: Thoracic;  Laterality: N/A;    Social History:  reports that she quit smoking about 7 years ago. Her smoking use included cigarettes. She has a 20.00 pack-year smoking history. She has never used smokeless tobacco. She reports current alcohol use of about 8.0 standard drinks of alcohol per week. She reports current drug use. Drug: Marijuana.   Allergies  Allergen Reactions  . Codeine Nausea And Vomiting    Family History  Problem Relation Age of Onset  . Heart disease Mother   . Hypertension Mother   . Heart attack Mother   . Hypertension Maternal Grandmother   . Cancer Maternal Grandmother   . Diabetes Paternal Grandmother   . Stroke Neg Hx       Prior to Admission medications   Medication Sig Start Date End Date Taking? Authorizing Provider  albuterol (VENTOLIN HFA) 108 (90 Base) MCG/ACT inhaler Inhale 2 puffs into the lungs every 6 (six) hours as needed for wheezing or shortness of breath. 04/16/19  Yes Bensimhon, Shaune Pascal, MD  ALPRAZolam Duanne Moron) 0.25 MG tablet Take 1 tablet (0.25 mg total) by mouth daily as needed for anxiety. 02/24/19  Yes Shirley Friar, PA-C  aspirin EC 81 MG tablet Take 1 tablet (81 mg total) by mouth daily. Swallow whole. 11/10/20  Yes Bensimhon, Shaune Pascal, MD  atorvastatin (LIPITOR) 80 MG tablet Take 1 tablet (80 mg total) by mouth daily at 6 PM. Patient taking differently: Take 80 mg by mouth every evening. 07/03/18  Yes Bensimhon, Shaune Pascal, MD  dapagliflozin propanediol (FARXIGA) 10 MG TABS tablet Take 1 tablet (10 mg total) by mouth daily before breakfast. 06/11/20  Yes Bensimhon, Shaune Pascal, MD  feeding supplement (ENSURE ENLIVE / ENSURE PLUS) LIQD Take 237 mLs by mouth 3 (three) times daily between meals. Patient taking differently: Take 237 mLs by mouth daily. 10/26/20  Yes Oretha Milch D, MD  ipratropium-albuterol (DUONEB) 0.5-2.5 (3) MG/3ML SOLN Take 3 mLs by nebulization 2 (two) times daily as needed (for shortness of breath).   Yes  [provider]  ivabradine (CORLANOR) 7.5 MG TABS tablet Take 1 tablet (7.5 mg total) by mouth 2 (two) times daily with a meal. 11/19/20  Yes Bensimhon, Shaune Pascal, MD  metolazone (ZAROXOLYN) 2.5 MG tablet Take 1 tablet (2.5 mg total) by mouth as directed. Take today and tomorrow and then hold till directed Patient taking differently: Take 2.5 mg by mouth daily as needed (fluid). 11/10/20  Yes Bensimhon, Shaune Pascal, MD  nitroGLYCERIN (NITROSTAT) 0.4 MG SL tablet Place 1 tablet (0.4 mg total) under the tongue every 5 (five) minutes as needed for chest pain. 02/14/19  Yes Shirley Friar, PA-C  potassium chloride SA (KLOR-CON) 20 MEQ tablet Take 2 tablets (40 mEq total) by mouth daily. Take 43mq (2 tablets) with metolazone doses 01/31/21  Yes Simmons, Brittainy M, PA-C  spironolactone (ALDACTONE) 25 MG tablet Take 0.5 tablets (12.5 mg total) by mouth daily. 03/01/21  Yes Clegg, Amy D, NP  torsemide (DEMADEX) 20 MG tablet Take 3 tablets (60 mg total) by mouth daily. 01/27/21  Yes SConsuelo Pandy PA-C    Physical Exam: BP 118/75   Pulse 79   Temp 98.6 F (37 C) (Oral)   Resp 20   SpO2 100%   . General: 60y.o. year-old female well developed well nourished in no acute distress.  Alert and oriented x3. . Cardiovascular: Regular rate and rhythm with no rubs or gallops.  No thyromegaly or JVD noted.  No lower extremity edema. 2/4 pulses in all 4 extremities. .[place] KitchenRespiratory: Diffuse wheezing bilaterally. Good inspiratory effort. . Abdomen: Soft nontender nondistended with normal bowel sounds x4 quadrants. . Muskuloskeletal: No cyanosis, clubbing or edema noted bilaterally . Neuro: CN II-XII intact, strength, sensation, reflexes . Skin: No ulcerative lesions noted or rashes . Psychiatry: Judgement and insight appear normal. Mood is appropriate for condition and setting          Labs on Admission:  Basic Metabolic Panel: Recent Labs  Lab 04/08/21 2315  NA 138  K 3.1*  CL 101   CO2 30  GLUCOSE 127*  BUN 20  CREATININE 1.23*  CALCIUM 9.5   Liver Function Tests: No results for input(s): AST, ALT, ALKPHOS, BILITOT, PROT, ALBUMIN in the last 168 hours. No results for input(s): LIPASE, AMYLASE in the last 168 hours. No results for input(s): AMMONIA in the last 168 hours. CBC: Recent Labs  Lab 04/08/21 2315  WBC 9.7  NEUTROABS 6.9  HGB 14.3  HCT 45.0  MCV 96.2  PLT 218   Cardiac Enzymes: No results for input(s): CKTOTAL, CKMB, CKMBINDEX, TROPONINI in the last 168 hours.  BNP (last 3 results) Recent Labs    05/21/20 1102 10/20/20 1702 04/08/21 2315  BNP 896.5* 1,118.4* 1,310.5*    ProBNP (  last 3 results) No results for input(s): PROBNP in the last 8760 hours.  CBG: No results for input(s): GLUCAP in the last 168 hours.  Radiological Exams on Admission: DG Chest Port 1 View  Result Date: 04/08/2021 CLINICAL DATA:  Shortness of breath. EXAM: PORTABLE CHEST 1 VIEW COMPARISON:  February 10, 2021 FINDINGS: A single lead ventricular pacer is seen. Multiple sternal wires and vascular clips are noted. The lungs are hyperinflated. Mild, chronic appearing increased interstitial lung markings are seen. Mild areas of linear atelectasis and scarring are again seen within the bilateral lung bases. Mild blunting of the right costophrenic angle is noted. There is no evidence of a pneumothorax. The cardiac silhouette is mildly enlarged. The visualized skeletal structures are unremarkable. IMPRESSION: 1. Evidence of prior median sternotomy/CABG. 2. COPD with mild, stable bibasilar scarring and/or atelectasis. 3. Small, stable right pleural effusion. Electronically Signed   By: Virgina Norfolk M.D.   On: 04/08/2021 22:59    EKG: I independently viewed the EKG done and my findings are as followed: Sinus rhythm, rate of 80, nonspecific ST-T changes, QTC 497.  Assessment/Plan Present on Admission: . Acute on chronic systolic (congestive) heart failure (HCC)  Active  Problems:   Acute on chronic systolic (congestive) heart failure (HCC)  Acute on chronic systolic CHF Presented with shortness of breath x2 days associated with weight gain of 5 pounds. Took extra doses of torsemide at home with no improvement of her symptomatology. BNP greater than 1300 Last 2D echo done on 02/10/2021 showed LVEF less than 20% Started diuresing in the ED, continue. Resume home cardiac medications. Strict I's and O's and daily weight Consult cardiology in the morning.  Hypokalemia likely from renal loss, diuretics Presented with serum potassium 3.1 Repleted orally Obtain magnesium level in the morning Recheck K+ level in the morning  Suspected impending asthma exacerbation Give 1 dose of IV Solu-Medrol 40 mg x 1. Resume home bronchodilators DuoNebs every 6 hours  COPD on 2 L nasal cannula as needed Increase in oxygen requirement Resume home bronchodilators Maintain O2 saturation greater than 90%. She will need home oxygen evaluation prior to DC.  CKD 3A She appears to be at her baseline creatinine 1.2 with GFR 50 Avoid nephrotoxic agents Monitor urine output Repeat BMP in the morning  Type 2 diabetes with hyperglycemia Obtain hemoglobin A1c Hold off home oral hypoglycemics Insulin sliding scale.   DVT prophylaxis: Subcu Lovenox daily.  Code Status: Full dose as stated by the patient herself.  Family Communication: None at bedside.  Disposition Plan: Admit to telemetry cardiac.  Consults called: Please consult cardiology in the morning.  Admission status: Observation status.   Status is: Observation    Dispo: The patient is from: Home.              Anticipated d/c is to: Home possibly on 04/09/2021, or when symptomatology has improved.              Patient currently not stable for discharge, ongoing management of acute on chronic systolic CHF.    Difficult to place patient, not applicable.       Kayleen Memos MD [facility] Pager [phone]  If 7PM-7AM, please contact night-coverage [email or web address] Password [email or web address]  04/09/2021, 1:03 AM

## 2021-04-09 NOTE — ED Provider Notes (Signed)
Care assumed from Dr. Gilford Raid at shift change.  Care signed out awaiting results of laboratory studies.  Patient with extensive cardiac history presenting with weight gain and shortness of breath unrelieved with increasing her dose of torsemide at home.  She has received IV Lasix here in the ER with modest diuresis, but no relief of her symptoms.  She remains tachypneic and has increased oxygen requirement.  I have spoken with the hospitalist who agrees to admit.  Laboratory studies have returned and show an elevated BNP, trivial elevation of troponin, but otherwise are unremarkable.   Veryl Speak, MD 04/09/21 [phone]

## 2021-04-09 NOTE — Progress Notes (Addendum)
[facility] PROGRESS NOTE   Brittany [name] WFU:[id number] DOB: [birth year] DOA: 04/08/2021  PCP: Maude Leriche, PA-C  Brief History/Interval Summary: 60 y.o. female with medical history significant for chronic systolic CHF with LVEF less than 20%, asthma, COPD on 2 L nasal cannula as needed nightly, hyperlipidemia, type 2 diabetes, who presented to [facility] ED due to complaints of shortness of breath x2 days associated with weight gain 3 pounds.  She took extra doses of torsemide with no improvement of her symptomatology.    She was also requiring her oxygen more than usual.  She did mention some shortness of breath.  She was hospitalized for further management.    Consultants: None yet  Procedures: None  Antibiotics: Anti-infectives (From admission, onward)   None      Subjective/Interval History: Mentions no significant improvement over the last 12 hours.  Continues to have some shortness of breath.  Denies any chest pain.  Denies any lower extremity edema.     Assessment/Plan:  Acute on chronic systolic CHF/AICD in situ Last echocardiogram was in March 2022 which showed EF to be less than 20%.  Patient is followed by Dr. [name] with the heart failure clinic.  Patient presented with shortness of breath and weight gain of 3 pounds.  Denies any noncompliance with her medications or diet recently.  During her last hospitalization the dose of her torsemide was increased to 60 mg daily. Continue IV diuretics for now.  We will increase the dose of her furosemide as she is only on 20 twice a day for now.  She is also on her other heart failure medications including spironolactone, ivabradine.  Not on beta-blocker due to intolerance previously. May need to give her metolazone depending on how she diuresis with higher dose of furosemide to be given today.  Hypokalemia Was supplemented yesterday with improvement today.  History of COPD and asthma/right-sided pleural  effusion Seems to be stable from a respiratory standpoint.  She is on 2 L of oxygen saturating in the 90s.  Uses oxygen at home as needed. Stable right pleural effusion noted on chest x-ray.  Chronic kidney disease stage IIIa Stable.  Avoid nephrotoxic agents.  Diabetes mellitus type 2 with hyperglycemia HbA1c 6.5.  Monitor CBGs.   DVT Prophylaxis: Lovenox Code Status: Full code Family Communication: Discussed with the patient Disposition Plan: Hopefully return home in improved  Status is: Observation  The patient will require care spanning > 2 midnights and should be moved to inpatient because: IV treatments appropriate due to intensity of illness or inability to take PO and Inpatient level of care appropriate due to severity of illness  Dispo: The patient is from: Home              Anticipated d/c is to: Home              Patient currently is not medically stable to d/c.   Difficult to place patient No     Medications:  Scheduled: . aspirin EC  81 mg Oral Daily  . atorvastatin  80 mg Oral QPM  . feeding supplement  237 mL Oral TID BM  . furosemide  20 mg Intravenous BID  . insulin aspart  0-5 Units Subcutaneous QHS  . insulin aspart  0-9 Units Subcutaneous TID WC  . ipratropium-albuterol  3 mL Nebulization Q6H  . ivabradine  7.5 mg Oral BID WC  . [START ON 04/10/2021] potassium chloride SA  40 mEq Oral Daily  . spironolactone  12.5  mg Oral Daily   Continuous:  TKW:[id number], ALPRAZolam, ipratropium-albuterol   Objective:  Vital Signs  Vitals:   04/09/21 0130 04/09/21 0154 04/09/21 0402 04/09/21 0950  BP: 111/80 123/70 110/67   Pulse: 77 80 76   Resp: (!) 25 18 18    Temp: 98.7 F (37.1 C) 98.1 F (36.7 C) 98.1 F (36.7 C)   TempSrc: Oral Oral Oral   SpO2: 98% 100% 100% 95%  Weight:  47.5 kg    Height:  5' (1.524 m)      Intake/Output Summary (Last 24 hours) at 04/09/2021 1035 Last data filed at 04/09/2021 0135 Gross per 24 hour  Intake --  Output 500  ml  Net -500 ml   Filed Weights   04/09/21 0154  Weight: 47.5 kg    General appearance: Awake alert.  In no distress Resp: Mildly tachypneic with few crackles at bilateral bases.  No wheezing or rhonchi. Cardio: S1-S2 is normal regular.  No S3-S4.  No rubs murmurs or bruit GI: Abdomen is soft.  Nontender nondistended.  Bowel sounds are present normal.  No masses organomegaly Extremities: No edema.  Full range of motion of lower extremities. Neurologic: Alert and oriented x3.  No focal neurological deficits.    Lab Results:  Data Reviewed: I have personally reviewed following labs and imaging studies  CBC: Recent Labs  Lab 04/08/21 2315 04/09/21 0424  WBC 9.7 10.4  NEUTROABS 6.9  --   HGB 14.3 14.2  HCT 45.0 44.4  MCV 96.2 96.1  PLT 218 924    Basic Metabolic Panel: Recent Labs  Lab 04/08/21 2315 04/09/21 0424 04/09/21 0625  NA 138  --  138  K 3.1*  --  3.7  CL 101  --  101  CO2 30  --  23  GLUCOSE 127*  --  164*  BUN 20  --  19  CREATININE 1.23*  --  0.96  CALCIUM 9.5  --  9.4  MG  --  1.8  --   PHOS  --  3.4  --     GFR: Estimated Creatinine Clearance: 44.8 mL/min (by C-G formula based on SCr of 0.96 mg/dL).   HbA1C: Recent Labs    04/09/21 0229  HGBA1C 6.5*    CBG: Recent Labs  Lab 04/09/21 0558  GLUCAP 213*      Recent Results (from the past 240 hour(s))  Resp Panel by RT-PCR (Flu A&B, Covid) Nasopharyngeal Swab     Status: None   Collection Time: 04/08/21 10:11 PM   Specimen: Nasopharyngeal Swab; Nasopharyngeal(NP) swabs in vial transport medium  Result Value Ref Range Status   SARS Coronavirus 2 by RT PCR NEGATIVE NEGATIVE Final    Comment: (NOTE) SARS-CoV-2 target nucleic acids are NOT DETECTED.  The SARS-CoV-2 RNA is generally detectable in upper respiratory specimens during the acute phase of infection. The lowest concentration of SARS-CoV-2 viral copies this assay can detect is 138 copies/mL. A negative result does not  preclude SARS-Cov-2 infection and should not be used as the sole basis for treatment or other patient management decisions. A negative result may occur with  improper specimen collection/handling, submission of specimen other than nasopharyngeal swab, presence of viral mutation(s) within the areas targeted by this assay, and inadequate number of viral copies(<138 copies/mL). A negative result must be combined with clinical observations, patient history, and epidemiological information. The expected result is Negative.  Fact Sheet for Patients:  EntrepreneurPulse.com.au  Fact Sheet for Healthcare Providers:  IncredibleEmployment.be  This  test is no t yet approved or cleared by the [place] and  has been authorized for detection and/or diagnosis of SARS-CoV-2 by FDA under an Emergency Use Authorization (EUA). This EUA will remain  in effect (meaning this test can be used) for the duration of the COVID-19 declaration under Section 564(b)(1) of the Act, 21 U.S.C.section 360bbb-3(b)(1), unless the authorization is terminated  or revoked sooner.       Influenza A by PCR NEGATIVE NEGATIVE Final   Influenza B by PCR NEGATIVE NEGATIVE Final    Comment: (NOTE) The Xpert Xpress SARS-CoV-2/FLU/RSV plus assay is intended as an aid in the diagnosis of influenza from Nasopharyngeal swab specimens and should not be used as a sole basis for treatment. Nasal washings and aspirates are unacceptable for Xpert Xpress SARS-CoV-2/FLU/RSV testing.  Fact Sheet for Patients: EntrepreneurPulse.com.au  Fact Sheet for Healthcare Providers: IncredibleEmployment.be  This test is not yet approved or cleared by the [place] FDA and has been authorized for detection and/or diagnosis of SARS-CoV-2 by FDA under an Emergency Use Authorization (EUA). This EUA will remain in effect (meaning this test can be used) for the  duration of the COVID-19 declaration under Section 564(b)(1) of the Act, 21 U.S.C. section 360bbb-3(b)(1), unless the authorization is terminated or revoked.  Performed at [facility], [place] [address] [place], [place] [address]       Radiology Studies: DG Chest Port 1 View  Result Date: 04/08/2021 CLINICAL DATA:  Shortness of breath. EXAM: PORTABLE CHEST 1 VIEW COMPARISON:  February 10, 2021 FINDINGS: A single lead ventricular pacer is seen. Multiple sternal wires and vascular clips are noted. The lungs are hyperinflated. Mild, chronic appearing increased interstitial lung markings are seen. Mild areas of linear atelectasis and scarring are again seen within the bilateral lung bases. Mild blunting of the right costophrenic angle is noted. There is no evidence of a pneumothorax. The cardiac silhouette is mildly enlarged. The visualized skeletal structures are unremarkable. IMPRESSION: 1. Evidence of prior median sternotomy/CABG. 2. COPD with mild, stable bibasilar scarring and/or atelectasis. 3. Small, stable right pleural effusion. Electronically Signed   By: Virgina Norfolk M.D.   On: 04/08/2021 22:59       LOS: 0 days   [facility] Pager on [email or web address]  04/09/2021, 10:35 AM

## 2021-04-09 NOTE — ED Notes (Addendum)
Report given to Lattie Haw, Therapist, sports. Denied questions or concerns. Pt taken upstairs on stretcher. Monitoring devices in place on 2L [place]. NAD. VSS

## 2021-04-09 NOTE — Plan of Care (Signed)

## 2021-04-10 DIAGNOSIS — E876 Hypokalemia: Secondary | ICD-10-CM

## 2021-04-10 DIAGNOSIS — N1832 Chronic kidney disease, stage 3b: Secondary | ICD-10-CM

## 2021-04-10 LAB — BASIC METABOLIC PANEL
Anion gap: 10 (ref 5–15)
BUN: 35 mg/dL — ABNORMAL HIGH (ref 6–20)
CO2: 30 mmol/L (ref 22–32)
Calcium: 10.2 mg/dL (ref 8.9–10.3)
Chloride: 96 mmol/L — ABNORMAL LOW (ref 98–111)
Creatinine, Ser: 1.31 mg/dL — ABNORMAL HIGH (ref 0.44–1.00)
GFR, Estimated: 47 mL/min — ABNORMAL LOW (ref >60)
Glucose, Bld: 166 mg/dL — ABNORMAL HIGH (ref 70–99)
Potassium: 3.6 mmol/L (ref 3.5–5.1)
Sodium: 136 mmol/L (ref 135–145)

## 2021-04-10 LAB — MAGNESIUM: Magnesium: 2.4 mg/dL (ref 1.7–2.4)

## 2021-04-10 LAB — GLUCOSE, CAPILLARY
Glucose-Capillary: 147 mg/dL — ABNORMAL HIGH (ref 70–99)
Glucose-Capillary: 202 mg/dL — ABNORMAL HIGH (ref 70–99)
Glucose-Capillary: 145 mg/dL — ABNORMAL HIGH (ref 70–99)
Glucose-Capillary: 211 mg/dL — ABNORMAL HIGH (ref 70–99)

## 2021-04-10 LAB — CBC
HCT: 41.3 % (ref 36.0–46.0)
Hemoglobin: 13.3 g/dL (ref 12.0–15.0)
MCH: 30.8 pg (ref 26.0–34.0)
MCHC: 32.2 g/dL (ref 30.0–36.0)
MCV: 95.6 fL (ref 80.0–100.0)
Platelets: 202 10*3/uL (ref 150–400)
RBC: 4.32 MIL/uL (ref 3.87–5.11)
RDW: 20.2 % — ABNORMAL HIGH (ref 11.5–15.5)
WBC: 10 10*3/uL (ref 4.0–10.5)
nRBC: 0 % (ref 0.0–0.2)

## 2021-04-10 MED ORDER — METOLAZONE 5 MG PO TABS
5.0000 mg | ORAL_TABLET | Freq: Once | ORAL | Status: AC
  Administered 2021-04-10: 5 mg via ORAL
  Filled 2021-04-10: qty 1

## 2021-04-10 MED ORDER — IPRATROPIUM-ALBUTEROL 0.5-2.5 (3) MG/3ML IN SOLN
3.0000 mL | Freq: Three times a day (TID) | RESPIRATORY_TRACT | Status: DC
  Administered 2021-04-10 (×3): 3 mL via RESPIRATORY_TRACT
  Filled 2021-04-10 (×3): qty 3

## 2021-04-10 NOTE — Progress Notes (Signed)
Report received from [facility]. Agree with documented assessment. Will assume care of patient

## 2021-04-10 NOTE — Progress Notes (Signed)
[facility] PROGRESS NOTE   Brittany [name] GLO:[id number] DOB: 02-Feb-1961 DOA: 04/08/2021  PCP: Maude Leriche, PA-C  Brief History/Interval Summary: 60 y.o. female with medical history significant for chronic systolic CHF with LVEF less than 20%, asthma, COPD on 2 L nasal cannula as needed nightly, hyperlipidemia, type 2 diabetes, who presented to [facility] ED due to complaints of shortness of breath x2 days associated with weight gain 3 pounds.  She took extra doses of torsemide with no improvement of her symptomatology.    She was also requiring her oxygen more than usual.  She did mention some shortness of breath.  She was hospitalized for further management.    Consultants: None yet  Procedures: None  Antibiotics: Anti-infectives (From admission, onward)   None      Subjective/Interval History: Patient feels slightly better though still short of breath and fatigued mainly with exertion.  She mentioned that her weight was noted to be higher today compared to 2 days ago.  Denies any chest pain   Assessment/Plan:  Acute on chronic systolic CHF/AICD in situ Last echocardiogram was in March 2022 which showed EF to be less than 20%.  Patient is followed by Dr. [name] with the heart failure clinic.  Patient presented with shortness of breath and weight gain of 3 pounds.  Denies any noncompliance with her medications or diet recently.  During her last hospitalization the dose of her torsemide was increased to 60 mg daily. Patient's IV furosemide dosage was increased yesterday.  Weight unfortunately seems to have gone up.  We will give her metolazone today.  She is not on beta-blocker due to intolerance previously.  Not on ACE inhibitor due to renal dysfunction.  Hypokalemia Continue to supplement.  History of COPD and asthma/right-sided pleural effusion Seems to be stable from a respiratory standpoint.  She is on 2 L of oxygen saturating in the 90s.  Uses oxygen at home  as needed. Stable right pleural effusion noted on chest x-ray.  Chronic kidney disease stage IIIa Renal function stable for the most part.  Slight rise in creatinine noted today but similar to what it was last month.  Monitor urine output.  Recheck labs tomorrow.  Diabetes mellitus type 2 with hyperglycemia HbA1c 6.5.  Monitor CBGs.  CBGs are reasonably well controlled.   DVT Prophylaxis: Lovenox Code Status: Full code Family Communication: Discussed with the patient Disposition Plan: Hopefully return home in improved  Status is: Inpatient  Remains inpatient appropriate because:IV treatments appropriate due to intensity of illness or inability to take PO and Inpatient level of care appropriate due to severity of illness   Dispo:  Patient From: Home  Planned Disposition: Home  Medically stable for discharge: No         Medications:  Scheduled: . aspirin EC  81 mg Oral Daily  . atorvastatin  80 mg Oral QPM  . dapagliflozin propanediol  10 mg Oral QAC breakfast  . enoxaparin (LOVENOX) injection  40 mg Subcutaneous Q24H  . feeding supplement  237 mL Oral TID BM  . furosemide  40 mg Intravenous BID  . insulin aspart  0-5 Units Subcutaneous QHS  . insulin aspart  0-9 Units Subcutaneous TID WC  . ipratropium-albuterol  3 mL Nebulization TID  . ivabradine  7.5 mg Oral BID WC  . potassium chloride SA  40 mEq Oral Daily  . spironolactone  12.5 mg Oral Daily   Continuous:  JOA:CZYSAYTKZ, ALPRAZolam, guaiFENesin, ipratropium-albuterol   Objective:  Vital Signs  Vitals:  04/09/21 2034 04/10/21 0304 04/10/21 0433 04/10/21 0907  BP: 108/61  101/64   Pulse: 81  77   Resp: 20  16   Temp: 98.2 F (36.8 C)  98 F (36.7 C)   TempSrc: Oral  Oral   SpO2: 99%  96% 95%  Weight:  48.5 kg    Height:        Intake/Output Summary (Last 24 hours) at 04/10/2021 0949 Last data filed at 04/10/2021 0855 Gross per 24 hour  Intake 1670 ml  Output 1450 ml  Net 220 ml   Filed  Weights   04/09/21 0154 04/10/21 0304  Weight: 47.5 kg 48.5 kg    General appearance: Awake alert.  In no distress Resp: Improved aeration.  Few crackles bilateral bases.  No wheezing or rhonchi. Cardio: S1-S2 is normal regular.  No S3-S4.  No rubs murmurs or bruit GI: Abdomen is soft.  Nontender nondistended.  Bowel sounds are present normal.  No masses organomegaly Extremities: No edema.  Full range of motion of lower extremities. Neurologic: Alert and oriented x3.  No focal neurological deficits.     Lab Results:  Data Reviewed: I have personally reviewed following labs and imaging studies  CBC: Recent Labs  Lab 04/08/21 2315 04/09/21 0424 04/10/21 0235  WBC 9.7 10.4 10.0  NEUTROABS 6.9  --   --   HGB 14.3 14.2 13.3  HCT 45.0 44.4 41.3  MCV 96.2 96.1 95.6  PLT 218 214 481    Basic Metabolic Panel: Recent Labs  Lab 04/08/21 2315 04/09/21 0424 04/09/21 0625 04/10/21 0235  NA 138  --  138 136  K 3.1*  --  3.7 3.6  CL 101  --  101 96*  CO2 30  --  23 30  GLUCOSE 127*  --  164* 166*  BUN 20  --  19 35*  CREATININE 1.23*  --  0.96 1.31*  CALCIUM 9.5  --  9.4 10.2  MG  --  1.8  --  2.4  PHOS  --  3.4  --   --     GFR: Estimated Creatinine Clearance: 32.8 mL/min (A) (by C-G formula based on SCr of 1.31 mg/dL (H)).   HbA1C: Recent Labs    04/09/21 0229  HGBA1C 6.5*    CBG: Recent Labs  Lab 04/09/21 0558 04/09/21 1128 04/09/21 1616 04/09/21 2056 04/10/21 0558  GLUCAP 213* 275* 214* 186* 147*      Recent Results (from the past 240 hour(s))  Resp Panel by RT-PCR (Flu A&B, Covid) Nasopharyngeal Swab     Status: None   Collection Time: 04/08/21 10:11 PM   Specimen: Nasopharyngeal Swab; Nasopharyngeal(NP) swabs in vial transport medium  Result Value Ref Range Status   SARS Coronavirus 2 by RT PCR NEGATIVE NEGATIVE Final    Comment: (NOTE) SARS-CoV-2 target nucleic acids are NOT DETECTED.  The SARS-CoV-2 RNA is generally detectable in upper  respiratory specimens during the acute phase of infection. The lowest concentration of SARS-CoV-2 viral copies this assay can detect is 138 copies/mL. A negative result does not preclude SARS-Cov-2 infection and should not be used as the sole basis for treatment or other patient management decisions. A negative result may occur with  improper specimen collection/handling, submission of specimen other than nasopharyngeal swab, presence of viral mutation(s) within the areas targeted by this assay, and inadequate number of viral copies(<138 copies/mL). A negative result must be combined with clinical observations, patient history, and epidemiological information. The expected result is Negative.  Fact Sheet for Patients:  EntrepreneurPulse.com.au  Fact Sheet for Healthcare Providers:  IncredibleEmployment.be  This test is no t yet approved or cleared by the [place] FDA and  has been authorized for detection and/or diagnosis of SARS-CoV-2 by FDA under an Emergency Use Authorization (EUA). This EUA will remain  in effect (meaning this test can be used) for the duration of the COVID-19 declaration under Section 564(b)(1) of the Act, 21 U.S.C.section 360bbb-3(b)(1), unless the authorization is terminated  or revoked sooner.       Influenza A by PCR NEGATIVE NEGATIVE Final   Influenza B by PCR NEGATIVE NEGATIVE Final    Comment: (NOTE) The Xpert Xpress SARS-CoV-2/FLU/RSV plus assay is intended as an aid in the diagnosis of influenza from Nasopharyngeal swab specimens and should not be used as a sole basis for treatment. Nasal washings and aspirates are unacceptable for Xpert Xpress SARS-CoV-2/FLU/RSV testing.  Fact Sheet for Patients: EntrepreneurPulse.com.au  Fact Sheet for Healthcare Providers: IncredibleEmployment.be  This test is not yet approved or cleared by the [place] FDA and has been  authorized for detection and/or diagnosis of SARS-CoV-2 by FDA under an Emergency Use Authorization (EUA). This EUA will remain in effect (meaning this test can be used) for the duration of the COVID-19 declaration under Section 564(b)(1) of the Act, 21 U.S.C. section 360bbb-3(b)(1), unless the authorization is terminated or revoked.  Performed at [facility], [place] [address] [place],  [address]       Radiology Studies: DG Chest Port 1 View  Result Date: 04/08/2021 CLINICAL DATA:  Shortness of breath. EXAM: PORTABLE CHEST 1 VIEW COMPARISON:  February 10, 2021 FINDINGS: A single lead ventricular pacer is seen. Multiple sternal wires and vascular clips are noted. The lungs are hyperinflated. Mild, chronic appearing increased interstitial lung markings are seen. Mild areas of linear atelectasis and scarring are again seen within the bilateral lung bases. Mild blunting of the right costophrenic angle is noted. There is no evidence of a pneumothorax. The cardiac silhouette is mildly enlarged. The visualized skeletal structures are unremarkable. IMPRESSION: 1. Evidence of prior median sternotomy/CABG. 2. COPD with mild, stable bibasilar scarring and/or atelectasis. 3. Small, stable right pleural effusion. Electronically Signed   By: Virgina Norfolk M.D.   On: 04/08/2021 22:59       LOS: 1 day   [facility] Pager on [email or web address]  04/10/2021, 9:49 AM

## 2021-04-11 ENCOUNTER — Ambulatory Visit (INDEPENDENT_AMBULATORY_CARE_PROVIDER_SITE_OTHER): Payer: Medicare Other

## 2021-04-11 DIAGNOSIS — Z95810 Presence of automatic (implantable) cardiac defibrillator: Secondary | ICD-10-CM

## 2021-04-11 DIAGNOSIS — I5022 Chronic systolic (congestive) heart failure: Secondary | ICD-10-CM

## 2021-04-11 LAB — BASIC METABOLIC PANEL
Anion gap: 14 (ref 5–15)
BUN: 33 mg/dL — ABNORMAL HIGH (ref 6–20)
CO2: 30 mmol/L (ref 22–32)
Calcium: 10.9 mg/dL — ABNORMAL HIGH (ref 8.9–10.3)
Chloride: 94 mmol/L — ABNORMAL LOW (ref 98–111)
Creatinine, Ser: 1.12 mg/dL — ABNORMAL HIGH (ref 0.44–1.00)
GFR, Estimated: 56 mL/min — ABNORMAL LOW (ref >60)
Glucose, Bld: 155 mg/dL — ABNORMAL HIGH (ref 70–99)
Potassium: 4.3 mmol/L (ref 3.5–5.1)
Sodium: 138 mmol/L (ref 135–145)

## 2021-04-11 LAB — GLUCOSE, CAPILLARY: Glucose-Capillary: 170 mg/dL — ABNORMAL HIGH (ref 70–99)

## 2021-04-11 MED ORDER — BLOOD PRESSURE CUFF MISC: 0 refills | Status: DC

## 2021-04-11 MED ORDER — TORSEMIDE 20 MG PO TABS: ORAL_TABLET | ORAL | 3 refills | Status: DC

## 2021-04-11 MED ORDER — BLOOD GLUCOSE MONITOR KIT: PACK | 0 refills | Status: DC

## 2021-04-11 NOTE — TOC Initial Note (Addendum)
Transition of Care [facility]) - Initial/Assessment Note    Patient Details  Name: Brittany [name] MRN: [id number] Date of Birth: 1961-01-19  Transition of Care [facility]) CM/SW Contact:    Zenon Mayo, RN Phone Number: 04/11/2021, 10:22 AM  Clinical Narrative:                 Patient is from home with her sister, she states she has a rollator and home oxygen that she uses prn.  She eats a low sodium diet, she has a scale which she weighs herself daily, she states she will need transport home today, she uses [facility] transport for her MD apts.  She states she does not have a bp cuff or glucometer,  NCM made referral for MD to give her a script for these, informed patient she can pick these up at [employer] [facility] [employer].   Expected Discharge Plan: Home/Self Care Barriers to Discharge: No Barriers Identified   Patient Goals and CMS Choice Patient states their goals for this hospitalization and ongoing recovery are:: to get better   Choice offered to / list presented to : NA  Expected Discharge Plan and Services Expected Discharge Plan: Home/Self Care In-house Referral: NA Discharge Planning Services: CM Consult Post Acute Care Choice: NA Living arrangements for the past 2 months: Single Family Home Expected Discharge Date: 04/11/21                 DME Agency: NA       HH Arranged: NA          Prior Living Arrangements/Services Living arrangements for the past 2 months: Single Family Home Lives with:: Siblings Patient language and need for interpreter reviewed:: Yes Do you feel safe going back to the place where you live?: Yes      Need for Family Participation in Patient Care: Yes (Comment) Care giver support system in place?: Yes (comment) Current home services: DME (home oxygen prn, rollator) Criminal Activity/Legal Involvement Pertinent to Current Situation/Hospitalization: No - Comment as needed  Activities of Daily Living Home Assistive Devices/Equipment: Environmental consultant  (specify type),Other (Comment),Oxygen ADL Screening (condition at time of admission) Patient's cognitive ability adequate to safely complete daily activities?: No Is the patient deaf or have difficulty hearing?: No Does the patient have difficulty seeing, even when wearing glasses/contacts?: No Does the patient have difficulty concentrating, remembering, or making decisions?: No Patient able to express need for assistance with ADLs?: No Does the patient have difficulty dressing or bathing?: No Independently performs ADLs?: Yes (appropriate for developmental age) Does the patient have difficulty walking or climbing stairs?: No Weakness of Legs: None Weakness of Arms/Hands: None  Permission Sought/Granted                  Emotional Assessment Appearance:: Appears stated age Attitude/Demeanor/Rapport: Engaged Affect (typically observed): Appropriate Orientation: : Oriented to Self,Oriented to Place,Oriented to  Time,Oriented to Situation Alcohol / Substance Use: Not Applicable Psych Involvement: No (comment)  Admission diagnosis:  Acute on chronic systolic (congestive) heart failure (HCC) [I50.23] Acute on chronic congestive heart failure, unspecified heart failure type ([place]) [Y40.3] Acute systolic (congestive) heart failure ([place]) [I50.21] Patient Active Problem List   Diagnosis Date Noted  . Acute on chronic systolic (congestive) heart failure ([place]) 04/09/2021  . Acute systolic (congestive) heart failure ([place]) 04/09/2021  . Unstable angina ([place]) 02/09/2021  . COPD with acute exacerbation ([place]) 10/21/2020  . Hemoptysis 10/20/2020  . DM2 (diabetes mellitus, type 2) ([place]) 10/20/2020  . Acute on chronic systolic  and diastolic heart failure, NYHA class 3 ([place]) 05/21/2020  . Ischemic cardiomyopathy 04/12/2020  . 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 NSTEMI (non-ST elevated myocardial infarction) ([place]) 02/12/2019  . CHF (congestive heart failure) ([place])  11/24/2018  . Recurrent pleural effusion on right 05/05/2018  . Acute respiratory failure ([place]) 05/05/2018  . Dyspnea 04/19/2018  . Chronic respiratory failure with hypoxia ([place])   . Acute on chronic systolic CHF (congestive heart failure) ([place]) 02/03/2018  . S/P CABG (coronary artery bypass graft) 02/03/2018  . Asthma   . Anxiety   . Allergy   . HCAP (healthcare-associated pneumonia) 03/15/2015  . Chest pain 03/15/2015  . Small cell lung cancer ([place]) 03/16/2014  . Protein-calorie malnutrition, severe ([place]) 03/14/2014  . Atrial fibrillation ([place]) 03/12/2014  . HTN (hypertension) 05/07/2012   PCP:  Maude Leriche, PA-C Pharmacy:   [id number] - [place], [place] [place] [address] Phone: [phone] Fax: [phone]  [facility] [address] [address] Phone: [phone] Fax: [phone]     Social Determinants of Health (SDOH) Interventions    Readmission Risk Interventions Readmission Risk Prevention Plan 04/11/2021  Transportation Screening Complete  PCP or Specialist Appt within 3-5 Days Complete  HRI or [place] Complete  Social Work Consult for [place] Planning/Counseling Complete  Palliative Care Screening Not Applicable  Medication Review Press photographer) Complete  Some recent data might be hidden

## 2021-04-11 NOTE — Progress Notes (Signed)
Patient currently hospitalized.  ICM remote transmission rescheduled from 04/11/2021 to 04/18/2021.

## 2021-04-11 NOTE — Progress Notes (Signed)
D/C instructions given and reviewed. Tele and IV removed, tolerated well. Notified NT that pt was ready to transport and to notify NS when to call [facility] transport

## 2021-04-11 NOTE — Discharge Summary (Signed)
[facility]  Physician Discharge Summary   Patient ID: Brittany [name] MRN: [id number] DOB/AGE: 61/25/1962 61 y.o.  Admit date: 04/08/2021 Discharge date: 04/12/2021  PCP: Scifres, Dorothy, PA-C  DISCHARGE DIAGNOSES:  Acute on chronic systolic CHF/AICD in situ History of COPD and asthma Chronic kidney disease stage IIIa Diabetes mellitus type 2  RECOMMENDATIONS FOR OUTPATIENT FOLLOW UP: 1. Ambulatory referral sent to the heart failure clinic for close outpatient follow-up    Home Health: None Equipment/Devices: None  CODE STATUS: Full code  DISCHARGE CONDITION: fair  Diet recommendation: Modified carbohydrate  INITIAL HISTORY: 60 y.o.femalewith medical history significant forchronic systolic CHF with LVEF less than 20%, asthma,COPD on 2 L nasal cannula as needednightly, hyperlipidemia, type 2 diabetes, who presented to [facility] ED due to complaints of shortness of breath x2 days associated with weight gain 3 pounds. She took extra doses of torsemide with no improvement of her symptomatology.   She was also requiring her oxygen more than usual.  She did mention some shortness of breath.  She was hospitalized for further management.     HOSPITAL COURSE:    Acute on chronic systolic CHF/AICD in situ Last echocardiogram was in March 2022 which showed EF to be less than 20%.  Patient is followed by Dr. Bensimhon with the heart failure clinic.  Patient presented with shortness of breath and weight gain of 3 pounds.  Denies any noncompliance with her medications or diet recently.  During her last hospitalization the dose of her torsemide was increased to 60 mg daily.  Patient was given IV diuretics.  She was also given a dose of metolazone.  She has good diuresis with decrease in weight.  She feels much better this morning.  Taken off of oxygen.  Wants to go home today.  Ambulatory referral sent to heart failure clinic.   She will be discharged on a higher dose of  torsemide for the next few days. She is not on beta-blocker due to intolerance previously.  Not on ACE inhibitor due to renal dysfunction.  Hypokalemia Continue to supplement.  History of COPD and asthma/right-sided pleural effusion Seems to be stable from a respiratory standpoint.  Stable right pleural effusion noted on chest x-ray.  On oxygen at home as needed.  Chronic kidney disease stage IIIa Renal function stable for the most part.    Diabetes mellitus type 2 with hyperglycemia HbA1c 6.5.    Patient is stable.  Okay for discharge home today.   PERTINENT LABS:  The results of significant diagnostics from this hospitalization (including imaging, microbiology, ancillary and laboratory) are listed below for reference.    Microbiology: Recent Results (from the past 240 hour(s))  Resp Panel by RT-PCR (Flu A&B, Covid) Nasopharyngeal Swab     Status: None   Collection Time: 04/08/21 10:11 PM   Specimen: Nasopharyngeal Swab; Nasopharyngeal(NP) swabs in vial transport medium  Result Value Ref Range Status   SARS Coronavirus 2 by RT PCR NEGATIVE NEGATIVE Final    Comment: (NOTE) SARS-CoV-2 target nucleic acids are NOT DETECTED.  The SARS-CoV-2 RNA is generally detectable in upper respiratory specimens during the acute phase of infection. The lowest concentration of SARS-CoV-2 viral copies this assay can detect is 138 copies/mL. A negative result does not preclude SARS-Cov-2 infection and should not be used as the sole basis for treatment or other patient management decisions. A negative result may occur with  improper specimen collection/handling, submission of specimen other than nasopharyngeal swab, presence of viral mutation(s) within the areas targeted   by this assay, and inadequate number of viral copies(<138 copies/mL). A negative result must be combined with clinical observations, patient history, and epidemiological information. The expected result is  Negative.  Fact Sheet for Patients:  EntrepreneurPulse.com.au  Fact Sheet for Healthcare Providers:  IncredibleEmployment.be  This test is no t yet approved or cleared by the [place] FDA and  has been authorized for detection and/or diagnosis of SARS-CoV-2 by FDA under an Emergency Use Authorization (EUA). This EUA will remain  in effect (meaning this test can be used) for the duration of the COVID-19 declaration under Section 564(b)(1) of the Act, 21 U.S.C.section 360bbb-3(b)(1), unless the authorization is terminated  or revoked sooner.       Influenza A by PCR NEGATIVE NEGATIVE Final   Influenza B by PCR NEGATIVE NEGATIVE Final    Comment: (NOTE) The Xpert Xpress SARS-CoV-2/FLU/RSV plus assay is intended as an aid in the diagnosis of influenza from Nasopharyngeal swab specimens and should not be used as a sole basis for treatment. Nasal washings and aspirates are unacceptable for Xpert Xpress SARS-CoV-2/FLU/RSV testing.  Fact Sheet for Patients: EntrepreneurPulse.com.au  Fact Sheet for Healthcare Providers: IncredibleEmployment.be  This test is not yet approved or cleared by the [place] FDA and has been authorized for detection and/or diagnosis of SARS-CoV-2 by FDA under an Emergency Use Authorization (EUA). This EUA will remain in effect (meaning this test can be used) for the duration of the COVID-19 declaration under Section 564(b)(1) of the Act, 21 U.S.C. section 360bbb-3(b)(1), unless the authorization is terminated or revoked.  Performed at [facility], [place] [address], [place], [place] [address]      Labs:  COVID-19 Labs   Lab Results  Component Value Date   [place] NEGATIVE 04/08/2021   [place] NEGATIVE 02/09/2021   [place] NEGATIVE 10/20/2020   [place] NEGATIVE 05/21/2020      Basic Metabolic Panel: Recent Labs  Lab 04/08/21 2315  04/09/21 0424 04/09/21 0625 04/10/21 0235 04/11/21 0440  NA 138  --  138 136 138  K 3.1*  --  3.7 3.6 4.3  CL 101  --  101 96* 94*  CO2 30  --  _0 GLUCOSE 127*  --  164* 166* 155*  BUN 20  --  19 35* 33*  CREATININE 1.23*  --  0.96 1.31* 1.12*  CALCIUM 9.5  --  9.4 10.2 10.9*  MG  --  1.8  --  2.4  --   PHOS  --  3.4  --   --   --    CBC: Recent Labs  Lab 04/08/21 2315 04/09/21 0424 04/10/21 0235  WBC 9.7 10.4 10.0  NEUTROABS 6.9  --   --   HGB 14.3 14.2 13.3  HCT 45.0 44.4 41.3  MCV 96.2 96.1 95.6  PLT 218 214 202   BNP: BNP (last 3 results) Recent Labs    05/21/20 1102 10/20/20 1702 04/08/21 2315  BNP 896.5* 1,118.4* 1,310.5*     CBG: Recent Labs  Lab 04/10/21 0558 04/10/21 1204 04/10/21 1557 04/10/21 2102 04/11/21 0542  GLUCAP 147* 202* 145* 211* 170*     IMAGING STUDIES DG Chest Port 1 View  Result Date: 04/08/2021 CLINICAL DATA:  Shortness of breath. EXAM: PORTABLE CHEST 1 VIEW COMPARISON:  February 10, 2021 FINDINGS: A single lead ventricular pacer is seen. Multiple sternal wires and vascular clips are noted. The lungs are hyperinflated. Mild, chronic appearing increased interstitial lung markings are seen. Mild areas of  linear atelectasis and scarring are again seen within the bilateral lung bases. Mild blunting of the right costophrenic angle is noted. There is no evidence of a pneumothorax. The cardiac silhouette is mildly enlarged. The visualized skeletal structures are unremarkable. IMPRESSION: 1. Evidence of prior median sternotomy/CABG. 2. COPD with mild, stable bibasilar scarring and/or atelectasis. 3. Small, stable right pleural effusion. Electronically Signed   By: Virgina Norfolk M.D.   On: 04/08/2021 22:59   CUP PACEART REMOTE DEVICE CHECK  Result Date: 04/05/2021 Scheduled remote reviewed. Normal device function.  Next remote 91 days- JBox, RN/CVRS   DISCHARGE EXAMINATION: Vitals:   04/10/21 1732 04/10/21 1926 04/11/21 0515  04/11/21 0927  BP: 96/63 106/69 (!) 97/57 101/68  Pulse: 89 87 81 88  Resp: 16 18    Temp: 98.9 F (37.2 C) 98.2 F (36.8 C) 98.3 F (36.8 C) 98.2 F (36.8 C)  TempSrc: Oral Oral Oral Oral  SpO2: 93% 97% 94% 92%  Weight:   48.2 kg   Height:       General appearance: Awake alert.  In no distress Resp: Clear to auscultation bilaterally.  Normal effort Cardio: S1-S2 is normal regular.  No S3-S4.  No rubs murmurs or bruit GI: Abdomen is soft.  Nontender nondistended.  Bowel sounds are present normal.  No masses organomegaly     DISPOSITION: Home  Discharge Instructions    (HEART FAILURE PATIENTS) Call MD:  Anytime you have any of the following symptoms: 1) 3 pound weight gain in 24 hours or 5 pounds in 1 week 2) shortness of breath, with or without a dry hacking cough 3) swelling in the hands, feet or stomach 4) if you have to sleep on extra pillows at night in order to breathe.   Complete by: As directed    AMB referral to CHF clinic   Complete by: As directed    Needs post hospital stay f/u in 1 week. thanks   Call MD for:  difficulty breathing, headache or visual disturbances   Complete by: As directed    Call MD for:  extreme fatigue   Complete by: As directed    Call MD for:  persistant dizziness or light-headedness   Complete by: As directed    Call MD for:  persistant nausea and vomiting   Complete by: As directed    Call MD for:  severe uncontrolled pain   Complete by: As directed    Call MD for:  temperature >100.4   Complete by: As directed    Diet - low sodium heart healthy   Complete by: As directed    Heart Failure patients record your daily weight using the same scale at the same time of day   Complete by: As directed    Increase activity slowly   Complete by: As directed          Allergies as of 04/11/2021      Reactions   Codeine Nausea And Vomiting      Medication List    TAKE these medications   albuterol 108 (90 Base) MCG/ACT inhaler Commonly  known as: VENTOLIN HFA Inhale 2 puffs into the lungs every 6 (six) hours as needed for wheezing or shortness of breath.   ALPRAZolam 0.25 MG tablet Commonly known as: XANAX Take 1 tablet (0.25 mg total) by mouth daily as needed for anxiety.   aspirin EC 81 MG tablet Take 1 tablet (81 mg total) by mouth daily. Swallow whole.   atorvastatin 80 MG  tablet Commonly known as: LIPITOR Take 1 tablet (80 mg total) by mouth daily at 6 PM. What changed: when to take this   blood glucose meter kit and supplies Kit Dispense based on patient and insurance preference. Use up to four times daily as directed.   Blood Pressure Cuff Misc Use as directed   dapagliflozin propanediol 10 MG Tabs tablet Commonly known as: Farxiga Take 1 tablet (10 mg total) by mouth daily before breakfast.   feeding supplement Liqd Take 237 mLs by mouth 3 (three) times daily between meals. What changed: when to take this   ipratropium-albuterol 0.5-2.5 (3) MG/3ML Soln Commonly known as: DUONEB Take 3 mLs by nebulization 2 (two) times daily as needed (for shortness of breath).   ivabradine 7.5 MG Tabs tablet Commonly known as: CORLANOR Take 1 tablet (7.5 mg total) by mouth 2 (two) times daily with a meal.   metolazone 2.5 MG tablet Commonly known as: ZAROXOLYN Take 1 tablet (2.5 mg total) by mouth as directed. Take today and tomorrow and then hold till directed What changed:   when to take this  reasons to take this  additional instructions   nitroGLYCERIN 0.4 MG SL tablet Commonly known as: NITROSTAT Place 1 tablet (0.4 mg total) under the tongue every 5 (five) minutes as needed for chest pain.   potassium chloride SA 20 MEQ tablet Commonly known as: KLOR-CON Take 2 tablets (40 mEq total) by mouth daily. Take 40meq (2 tablets) with metolazone doses   spironolactone 25 MG tablet Commonly known as: ALDACTONE Take 0.5 tablets (12.5 mg total) by mouth daily.   torsemide 20 MG tablet Commonly known  as: DEMADEX Take 80mg (4 tablets) once daily for 4 days and then resume usual dose of 60mg daily. What changed:   how much to take  how to take this  when to take this  additional instructions         Follow-up Information    Bensimhon, Daniel R, MD Follow up.   Specialty: Cardiology Why: please call the office if you dont hear from by midweek. Contact information: [address] [place] [address] [phone]        Scifres, Dorothy, PA-C. Go on 04/20/2021.   Specialty: Physician Assistant Why: @9:45am Contact information: [address] [place] [address] [phone]               TOTAL DISCHARGE TIME: 35 minutes  Gokul Krishnan  [facility] Pager on [email or web address]  04/12/2021, 1:39 PM     

## 2021-04-11 NOTE — Discharge Instructions (Signed)
Heart Failure, Self-Care Heart failure is a serious condition. The following information explains things you need to do to take care of yourself at home. To help you stay as healthy as possible, you may be asked to change your diet, take certain medicines, and make other changes in your life. Your doctor may also give you more specific instructions. If you have problems or questions, call your doctor. What are the risks? Having heart failure makes it more likely for you to have some problems. These problems can get worse if you do not take good care of yourself. Problems may include:  Damage to the kidneys, liver, or lungs.  Malnutrition.  Abnormal heart rhythms.  Blood clotting problems that could cause a stroke. Supplies needed:  Scale for weighing yourself.  Blood pressure monitor.  Notebook.  Medicines. How to care for yourself when you have heart failure Medicines Take over-the-counter and prescription medicines only as told by your doctor. Take your medicines every day.  Do not stop taking your medicine unless your doctor tells you to do so.  Do not skip any medicines.  Get your prescriptions refilled before you run out of medicine. This is important.  Talk with your doctor if you cannot afford your medicines. Eating and drinking  Eat heart-healthy foods. Talk with a diet specialist (dietitian) to create an eating plan.  Limit salt (sodium) if told by your doctor. Ask your diet specialist to tell you which seasonings are healthy for your heart.  Cook in healthy ways instead of frying. Healthy ways of cooking include roasting, grilling, broiling, baking, poaching, steaming, and stir-frying.  Choose foods that: ? Have no trans fat. ? Are low in saturated fat and cholesterol.  Choose healthy foods, such as: ? Fresh or frozen fruits and vegetables. ? Fish. ? Low-fat (lean) meats. ? Legumes, such as beans, peas, and lentils. ? Fat-free or low-fat dairy  products. ? Whole-grain foods. ? High-fiber foods.  Limit how much fluid you drink, if told by your doctor.   Alcohol use  Do not drink alcohol if: ? Your doctor tells you not to drink. ? Your heart was damaged by alcohol, or you have very bad heart failure. ? You are pregnant, may be pregnant, or are planning to become pregnant.  If you drink alcohol: ? Limit how much you have to:  0-1 drink a day for women.  0-2 drinks a day for men. ? Know how much alcohol is in your drink. In the [place] one drink equals one 12 oz bottle of beer (355 mL), one 5 oz glass of wine (148 mL), or one 1 oz glass of hard liquor (44 mL). Lifestyle  Do not smoke or use any products that contain nicotine or tobacco. If you need help quitting, ask your doctor. ? Do not use nicotine gum or patches before talking to your doctor.  Do not use illegal drugs.  Lose weight if told by your doctor.  Do physical activity if told by your doctor. Talk to your doctor before you begin an exercise if: ? You are an older adult. ? You have very bad heart failure.  Learn to manage stress. If you need help, ask your doctor.  Get physical rehab (rehabilitation) to help you stay independent and to help with your quality of life.  Participate in a cardiac rehab program. This program helps you improve your health through exercise, education, and counseling.  Plan time to rest when you get tired.   Check weight   and blood pressure  Weigh yourself every day. This will help you to know if fluid is building up in your body. ? Weigh yourself every morning after you pee (urinate) and before you eat breakfast. ? Wear the same amount of clothing each time. ? Write down your daily weight. Give your record to your doctor.  Check and write down your blood pressure as told by your doctor.  Check your pulse as told by your doctor.   Dealing with very hot and very cold weather  If it is very hot: ? Avoid activities that take a  lot of energy. ? Use air conditioning or fans, or find a cooler place. ? Avoid caffeine and alcohol. ? Wear clothing that is loose-fitting, lightweight, and light-colored.  If it is very cold: ? Avoid activities that take a lot of energy. ? Layer your clothes. ? Wear mittens or gloves, a hat, and a face covering when you go outside. ? Avoid alcohol. Follow these instructions at home:  Stay up to date with shots (vaccines). Get pneumococcal and flu (influenza) shots.  Keep all follow-up visits. Contact a doctor if:  You gain 2-3 lb (1-1.4 kg) in 24 hours or 5 lb (2.3 kg) in a week.  You have increasing shortness of breath.  You cannot do your normal activities.  You get tired easily.  You cough a lot.  You do not feel like eating or feel like you may vomit (nauseous).  You have swelling in your hands, feet, ankles, or belly (abdomen).  You cannot sleep well because it is hard to breathe.  You feel like your heart is beating fast (palpitations).  You get dizzy when you stand up.  You feel depressed or sad. Get help right away if:  You have trouble breathing.  You or someone else notices a change in your behavior, such as having trouble staying awake.  You have chest pain or discomfort.  You pass out (faint). These symptoms may be an emergency. Get help right away. Call your local emergency services (911 in the [place]  Do not wait to see if the symptoms will go away.  Do not drive yourself to the hospital. Summary  Heart failure is a serious condition. To care for yourself, you may have to change your diet, take medicines, and make other lifestyle changes.  Take your medicines every day. Do not stop taking them unless your doctor tells you to do so.  Limit salt and eat heart-healthy foods.  Ask your doctor if you can drink alcohol. You may have to stop alcohol use if you have very bad heart failure.  Contact your doctor if you gain weight quickly or feel  that your heart is beating too fast. Get help right away if you pass out or have chest pain or trouble breathing. This information is not intended to replace advice given to you by your health care provider. Make sure you discuss any questions you have with your health care provider. Document Revised: 06/19/2020 Document Reviewed: 06/19/2020 Elsevier Patient Education  2021 Elsevier Inc.  

## 2021-04-12 NOTE — Progress Notes (Signed)
EPIC Encounter for ICM Monitoring  Patient Name: Brittany [name] is a 60 y.o. female Date: 04/12/2021 Primary Care Physican: Scifres, [name], PA-C Primary Cardiologist:Bensimhon Electrophysiologist:Allred 5/3/2022Weight: 103lbs (baseline 103 lbs)  Spoke with patient.  Patient was hospitalized 4/29-5/2 for CHF exacerbation.  She is feeling better since hospital discharge.  Her weight is baseline.  She has been diagnosed with diabetes and thinks it is related to taking so many steroids.   OptivolThoracic impedancesuggestingfluid levels returned to normal after being hospital diuresing.  Prescribed:   Torsemide20 mgTake3 tablets (60 mg total)daily.   Potassium 20 mEq take2tabletsby mouth daily. Take 2tablets (40 mEq total)with Metolazone  Metolazone 2.5 mgtake as directed  Labs: 04/11/2021 Creatinine 1.12, BUN 33, Potassium 4.3, Sodium 138, GFR 56 04/10/2021 Creatinine 1.31, BUN 35, Potassium 3.6, Sodium 136, GFR 47  04/09/2021 Creatinine 0.96, BUN 19, Potassium 3.7, Sodium 138, GFR >60  04/08/2021 Creatinine 1.23, BUN 20, Potassium 3.1, Sodium 138, GFR 50  03/14/2021 Creatinine1.28, BUN22, Potassium4.1, Sodium139, GFR48 03/01/2021 Creatinine1.07, BUN17, Potassium3.1, Sodium137, GFR59 02/12/2021 Creatinine1.12, BUN33, Potassium3.7, Sodium135, WOE32 A complete set of results can be found in Results Review.  Recommendations:Discussed at length regarding low sodium diabetic diet.  She is waiting for HF clinic to call to schedule post hospital visit.  Advised to call if she experience return of HF symptoms.  Follow-up plan: ICM clinic phone appointment on5/08/2021 to recheck fluid levels. 91 day device clinic remote transmission7/26/2022.  EP/Cardiology Office Visits: 07/01/2021 withDr Bensimhon.  Copy of ICM check sent to Dr.Allred.  3 month ICM trend: 04/11/2021.    1 Year ICM trend:       [name],  RN 04/12/2021 10:43 AM

## 2021-04-13 ENCOUNTER — Telehealth (HOSPITAL_COMMUNITY): Payer: Self-pay | Admitting: Internal Medicine

## 2021-04-13 NOTE — Telephone Encounter (Signed)
First available is with APP June 1st

## 2021-04-13 NOTE — Telephone Encounter (Signed)
Pt need hosp f/u w/DB, please advise

## 2021-04-18 ENCOUNTER — Ambulatory Visit (INDEPENDENT_AMBULATORY_CARE_PROVIDER_SITE_OTHER): Payer: Medicare Other

## 2021-04-18 DIAGNOSIS — I5022 Chronic systolic (congestive) heart failure: Secondary | ICD-10-CM

## 2021-04-18 DIAGNOSIS — Z95810 Presence of automatic (implantable) cardiac defibrillator: Secondary | ICD-10-CM

## 2021-04-18 NOTE — Progress Notes (Signed)
EPIC Encounter for ICM Monitoring  Patient Name: Brittany [name] is a 60 y.o. female Date: 04/18/2021 Primary Care Physican: Scifres, [name], PA-C Primary Cardiologist:Bensimhon Electrophysiologist:Allred 5/3/2022Weight: 103lbs(baseline 103 lbs)  Spoke with patient.  She stated she took extra Torsemide due to she thought she had some fluid which correlates with impedance trending above baseline.  OptivolThoracic impedancetrending above baseline suggesting possible dryness.  Prescribed:   Torsemide20 mgTake3 tablets (60 mg total)daily.   Potassium 20 mEq take2tabletsby mouth daily. Take 2tablets (40 mEq total)with Metolazone  Metolazone 2.5 mgtake as directed  Labs: 04/11/2021 Creatinine 1.12, BUN 33, Potassium 4.3, Sodium 138, GFR 56 04/10/2021 Creatinine 1.31, BUN 35, Potassium 3.6, Sodium 136, GFR 47  04/09/2021 Creatinine 0.96, BUN 19, Potassium 3.7, Sodium 138, GFR >60  04/08/2021 Creatinine 1.23, BUN 20, Potassium 3.1, Sodium 138, GFR 50  03/14/2021 Creatinine1.28, BUN22, Potassium4.1, Sodium139, GFR48 03/01/2021 Creatinine1.07, BUN17, Potassium3.1, Sodium137, GFR59 02/12/2021 Creatinine1.12, BUN33, Potassium3.7, Sodium135, HQP59 A complete set of results can be found in Results Review.  Recommendations:Advised to maintain 64 oz fluid intake to maintain hydration.  Advised to call the office for any changes.  Follow-up plan: ICM clinic phone appointment on5/31/2022. 91 day device clinic remote transmission7/26/2022.  EP/Cardiology Office Visits: 05/11/2021 with HF Clinic 07/01/2021 withDr Bensimhon.  Copy of ICM check sent to Dr.Allred.  3 month ICM trend: 04/18/2021.    1 Year ICM trend:       [name], RN 04/18/2021 3:42 PM

## 2021-04-26 NOTE — Progress Notes (Signed)
Remote ICD transmission.   

## 2021-05-10 ENCOUNTER — Ambulatory Visit (INDEPENDENT_AMBULATORY_CARE_PROVIDER_SITE_OTHER): Payer: BC Managed Care – PPO

## 2021-05-10 DIAGNOSIS — I5022 Chronic systolic (congestive) heart failure: Secondary | ICD-10-CM | POA: Diagnosis not present

## 2021-05-10 DIAGNOSIS — Z95810 Presence of automatic (implantable) cardiac defibrillator: Secondary | ICD-10-CM

## 2021-05-10 NOTE — Progress Notes (Signed)
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 Underwent emergent cath 02/03/18 showed LAD 100% stenosed, LCx 95% stenosed. Taken for emergent CABG 02/03/18. Required impella post op. Hospital course complicated by cardiogenic shock, HCAP, A fib, respiratory failure, and swallowing issues. She was discharged to SNF. Discharge weight 103 pounds.   In 2019 had multiple hospitalizations for HF and pleural effusion. Underwent pleurodesis at [facility].  Admitted 3/20 with NSTEMI and HF. Received DES to ostial ramus into distal left main based on cath below and underwent diuresis. Meds adjusted as tolerated. Echo with EF 25-30%.   Admitted 10/2020 with COPD flare with mild HF and hemoptysis. CTA showed stable scarring in the right hilum consistent with emphysema.. Underwent bronchoscopy on 11/15, no obvious source of bleeding found on examination.   Echo 11/21 EF 20-25%. RV mildly HK.   Had post hospital f/u on 11/10/20 and was fluid overloaded on device interrogation. She was instructed to take Metolazone 2.34m x 2 days + 40 mEq KCl and torsemide was increased from 40>>60 mg daily.   Had return f/u again on 11/15/20 and was felt to be dry/ over diuresed. Optivol fluid index was way down. Labs were c/w AKI. SCr had bumped from 0.95>>1.82. BUN 14>>38. She was instructed to stop metolazone and hold torsemide x 2 days, then resume at alternating dosing 60 mg one day and 40 mg the next.  Admitted 02/09/21 with chest pain and shortness of breath. HS-Trop with  no trend. Echo with EF < 20%. Treated for AECOPD exacerbation. Breathing improved. Discharged on torsemide 60 mg daily.  Admitted 04/08/21 with ADHF. Diuresed with IV lasix and transitioned back to torsemide 80 mg daily x 4 days then down to torsemide 60 mg daily.   Today she returns for post hospital HF follow up.Overall feeling terrible. Wants to be considered for a heart transplant or anything that could help.  SOB with exertion. Denies PND/Orthopnea. No chest pain.  Appetite ok. No fever or chills. Weight at home 103 pounds. Taking all medications. Lives alone but has support from her sister.   Cardidac Studies: ECHO 2022: EF < 20% RV normal  ECHO 2021: EF 20-25% RV mildly reduced.  Echo 02/12/19: EF 25-30%, RV mildly reduced  LHC 02/12/19:  Ost LAD to Prox LAD lesion is 100% stenosed.  Ost Cx to Prox Cx lesion is 100% stenosed.  Ost Ramus lesion is 99% stenosed.  Post intervention, there is a 0% residual stenosis.  A drug-eluting stent was successfully placed using a [id number].  SVG and is normal in caliber.  The graft exhibits no disease.  Ost 1st Diag lesion is 70% stenosed.  SVG and is normal in caliber.  Prox Graft lesion is 100% stenosed. 1. Significant underlying two-vessel coronary artery disease with patent SVG to OM and SVG to diagonal which supplies the LAD territory. Atretic LIMA to LAD given competitive flow from SVG to diagonal. Severe ostial ramus artery stenosis not supplied by bypass with his area suggestive of plaque rupture. 2. Severely elevated left ventricular end-diastolic pressure  34 mmHg. Left ventricular angiography was not performed. 3. Successful angioplasty and drug-eluting stent placement to the ostial ramus extending into the distal left main.  [place] 2019  RA = 8 RV = 49/9 PA = 47/20 (32) PCW = 19 Fick cardiac output/index = 4.3/3.1 PVR = 3.0 wu AO sat = 93% PA sat = 62%, 64% Assessment: 1. Minimally elevated left  heart pressures 2. Mild PAH 3. Normal cardiac output   Past Medical History:  Diagnosis Date  . Acute systolic congestive heart failure ([place]) 02/03/2018  . Allergy   . Anxiety   . Asthma   . DM2 (diabetes mellitus, type 2) ([place]) 10/20/2020  . Hypertension   . PAF (paroxysmal atrial fibrillation) ([place])   . Prophylactic measure 08/03/14-08/19/14   Prophyl. cranial radiation 24 Gy  . 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 Small cell lung cancer ([place]) 03/16/2014   Past Surgical History:  Procedure Laterality Date  . BRONCHIAL BRUSHINGS  10/25/2020   Procedure: BRONCHIAL BRUSHINGS;  Surgeon: Collene Gobble, MD;  Location: [facility] ENDOSCOPY;  Service: Pulmonary;;  . BRONCHIAL NEEDLE ASPIRATION BIOPSY  10/25/2020   Procedure: BRONCHIAL NEEDLE ASPIRATION BIOPSIES;  Surgeon: Collene Gobble, MD;  Location: [facility] ENDOSCOPY;  Service: Pulmonary;;  . CARDIAC DEFIBRILLATOR PLACEMENT  08/15/2018   MDT Visia AF MRI VR ICD implanted by Dr Loel Lofty for primary prevention of sudden  . CESAREAN SECTION    . CORONARY ARTERY BYPASS GRAFT N/A 02/03/2018   Procedure: CORONARY ARTERY BYPASS GRAFTING (CABG);  Surgeon: Rexene Alberts, MD;  Location: [place];  Service: Open Heart Surgery;  Laterality: N/A;  Time 3 using left internal mammary artery and endoscopically harvested right saphenous vein  . CORONARY BALLOON ANGIOPLASTY N/A 02/03/2018   Procedure: CORONARY BALLOON ANGIOPLASTY;  Surgeon: Martinique, Peter M, MD;  Location: [place] CV LAB;  Service: Cardiovascular;  Laterality: N/A;  . CORONARY STENT INTERVENTION N/A 02/12/2019   Procedure: CORONARY STENT INTERVENTION;  Surgeon: Wellington Hampshire, MD;  Location: [place] CV LAB;  Service: Cardiovascular;  Laterality: N/A;  . CORONARY/GRAFT ACUTE MI REVASCULARIZATION N/A 02/03/2018   Procedure: Coronary/Graft Acute MI Revascularization;  Surgeon:  Martinique, Peter M, MD;  Location: [place] CV LAB;  Service: Cardiovascular;  Laterality: N/A;  . ENDOBRONCHIAL ULTRASOUND N/A 10/25/2020   Procedure: ENDOBRONCHIAL ULTRASOUND;  Surgeon: Collene Gobble, MD;  Location: [facility] ENDOSCOPY;  Service: Pulmonary;  Laterality: N/A;  . FLEXIBLE BRONCHOSCOPY  10/25/2020   Procedure: FLEXIBLE BRONCHOSCOPY;  Surgeon: Collene Gobble, MD;  Location: [facility] ENDOSCOPY;  Service: Pulmonary;;  . IABP INSERTION N/A 02/03/2018   Procedure: IABP Insertion;  Surgeon: Martinique, Peter M, MD;  Location: [place] CV LAB;  Service: Cardiovascular;  Laterality: N/A;  . INTRAOPERATIVE TRANSESOPHAGEAL ECHOCARDIOGRAM N/A 02/03/2018   Procedure: INTRAOPERATIVE TRANSESOPHAGEAL ECHOCARDIOGRAM;  Surgeon: Rexene Alberts, MD;  Location: [place];  Service: Open Heart Surgery;  Laterality: N/A;  . LEFT HEART CATH AND CORONARY ANGIOGRAPHY N/A 02/03/2018   Procedure: LEFT HEART CATH AND CORONARY ANGIOGRAPHY;  Surgeon: Martinique, Peter M, MD;  Location: [place] CV LAB;  Service: Cardiovascular;  Laterality: N/A;  . LEFT HEART CATH AND CORS/GRAFTS ANGIOGRAPHY N/A 02/12/2019   Procedure: LEFT HEART CATH AND CORS/GRAFTS ANGIOGRAPHY;  Surgeon: Wellington Hampshire, MD;  Location: [place] CV LAB;  Service: Cardiovascular;  Laterality: N/A;  .  MEDIASTINOSCOPY N/A 03/11/2014   Procedure: MEDIASTINOSCOPY;  Surgeon: Melrose Nakayama, MD;  Location: [place];  Service: Thoracic;  Laterality: N/A;  . PLACEMENT OF [place] LEFT VENTRICULAR ASSIST DEVICE  02/03/2018   Procedure: PLACEMENT OF [place] LEFT VENTRICULAR ASSIST DEVICE LD;  Surgeon: Rexene Alberts, MD;  Location: [place];  Service: Open Heart Surgery;;  . REMOVAL OF IMPELLA LEFT VENTRICULAR ASSIST DEVICE N/A 02/08/2018   Procedure: REMOVAL OF [place] LEFT VENTRICULAR ASSIST DEVICE;  Surgeon: Rexene Alberts, MD;  Location: [place];  Service: Open Heart Surgery;  Laterality: N/A;  . RIGHT HEART CATH N/A 02/03/2018   Procedure: RIGHT HEART CATH;  Surgeon:  Martinique, Peter M, MD;  Location: [place] CV LAB;  Service: Cardiovascular;  Laterality: N/A;  . RIGHT HEART CATH N/A 05/09/2018   Procedure: RIGHT HEART CATH;  Surgeon: Jolaine Artist, MD;  Location: [place] CV LAB;  Service: Cardiovascular;  Laterality: N/A;  . TEE WITHOUT CARDIOVERSION N/A 02/08/2018   Procedure: TRANSESOPHAGEAL ECHOCARDIOGRAM (TEE);  Surgeon: Rexene Alberts, MD;  Location: [place];  Service: Open Heart Surgery;  Laterality: N/A;  . TUBAL LIGATION    . VIDEO BRONCHOSCOPY WITH ENDOBRONCHIAL ULTRASOUND N/A 03/11/2014   Procedure: VIDEO BRONCHOSCOPY WITH ENDOBRONCHIAL ULTRASOUND;  Surgeon: Melrose Nakayama, MD;  Location: [place];  Service: Thoracic;  Laterality: N/A;     Current Outpatient Medications  Medication Sig Dispense Refill  . albuterol (VENTOLIN HFA) 108 (90 Base) MCG/ACT inhaler Inhale 2 puffs into the lungs every 6 (six) hours as needed for wheezing or shortness of breath. 1 Inhaler 0  . ALPRAZolam (XANAX) 0.25 MG tablet Take 1 tablet (0.25 mg total) by mouth daily as needed for anxiety. 10 tablet 0  . aspirin EC 81 MG tablet Take 1 tablet (81 mg total) by mouth daily. Swallow whole. 30 tablet 3  . atorvastatin (LIPITOR) 80 MG tablet Take 1 tablet (80 mg total) by mouth daily at 6 PM. (Patient taking differently: Take 80 mg by mouth every evening.) 30 tablet 1  . blood glucose meter kit and supplies KIT Dispense based on patient and insurance preference. Use up to four times daily as directed. 1 each 0  . Blood Pressure Monitoring (BLOOD PRESSURE CUFF) MISC Use as directed 1 each 0  . dapagliflozin propanediol (FARXIGA) 10 MG TABS tablet Take 1 tablet (10 mg total) by mouth daily before breakfast. 30 tablet 6  . Ensure (ENSURE) Take 237 mLs by mouth daily.    Marland Kitchen ipratropium-albuterol (DUONEB) 0.5-2.5 (3) MG/3ML SOLN Take 3 mLs by nebulization 2 (two) times daily as needed (for shortness of breath).    . ivabradine (CORLANOR) 7.5 MG TABS tablet Take 1 tablet  (7.5 mg total) by mouth 2 (two) times daily with a meal. 60 tablet 5  . metolazone (ZAROXOLYN) 2.5 MG tablet Take 1 tablet (2.5 mg total) by mouth as directed. Take today and tomorrow and then hold till directed (Patient taking differently: Take 2.5 mg by mouth daily as needed (fluid).) 10 tablet 0  . potassium chloride SA (KLOR-CON) 20 MEQ tablet Take 20 mEq by mouth daily. With add'l 20 meq with metolazone    . spironolactone (ALDACTONE) 25 MG tablet Take 0.5 tablets (12.5 mg total) by mouth daily. 45 tablet 3  . torsemide (DEMADEX) 20 MG tablet Take 60 mg by mouth daily.    . nitroGLYCERIN (NITROSTAT) 0.4 MG SL tablet Place 1 tablet (0.4 mg total) under the tongue every 5 (five) minutes as  needed for chest pain. (Patient not taking: Reported on 05/11/2021) 90 tablet 5   No current facility-administered medications for this encounter.    Allergies:   Codeine   Social History:  The patient  reports that she quit smoking about 7 years ago. Her smoking use included cigarettes. She has a 20.00 pack-year smoking history. She has never used smokeless tobacco. She reports current alcohol use of about 8.0 standard drinks of alcohol per week. She reports current drug use. Drug: Marijuana.   Family History:  The patient's family history includes Cancer in her maternal grandmother; Diabetes in her paternal grandmother; Heart attack in her mother; Heart disease in her mother; Hypertension in her maternal grandmother and mother.   ROS:  Please see the history of present illness.   All other systems are personally reviewed and negative.    Vitals:   05/11/21 1408  BP: (!) 102/58  Pulse: 72  SpO2: 98%  Weight: 48.2 kg (106 lb 3.2 oz)   PHYSICAL EXAM: General:  Thin walked in the clinic.  No resp difficulty HEENT: normal Neck: supple. JVP 7-8  Carotids 2+ bilat; no bruits. No lymphadenopathy or thryomegaly appreciated. Cor: PMI nondisplaced. Regular rate & rhythm. No rubs, gallops or murmurs. Lungs:  clear Abdomen: soft, nontender, nondistended. No hepatosplenomegaly. No bruits or masses. Good bowel sounds. Extremities: no cyanosis, clubbing, rash, edema Neuro: alert & orientedx3, cranial nerves grossly intact. moves all 4 extremities w/o difficulty. Affect pleasant  EKG: SR 76 bpm   Recent Labs: 02/09/2021: ALT 16; TSH 0.765 04/08/2021: B Natriuretic Peptide 1,310.5 04/10/2021: Hemoglobin 13.3; Magnesium 2.4; Platelets 202 04/11/2021: BUN 33; Creatinine, Ser 1.12; Potassium 4.3; Sodium 138  Personally reviewed   Wt Readings from Last 3 Encounters:  05/11/21 48.2 kg (106 lb 3.2 oz)  04/11/21 48.2 kg (106 lb 3.2 oz)  03/29/21 48.5 kg (107 lb)    ASSESSMENT AND PLAN:  1. CAD  - Hx of NSTEMI/STEMI s/p Emergent CABG 02/03/18: Cath with severe 2v CAD as above with severe LV dysfunction/ICM. s/p Emergent CABG 02/03/18. - Had NSTEMI and now s/p LHC 02/12/19 with DES to the ostial ramus extending into the distal left main. - No chest pain.  - No longer on brillinta due to bleeding.  - On ASA.  -Continue hi-intensity statin.  2.  COPD - No wheeze - On  Duonebs  3.ChronicSystolic HF due to ICM. S/p Medtronic single chamber ICD. -Echo 09/04/18 ([facility]) LVEF 20%, Moderate AI, Mild MR, Mild TR, Severe LAE, RV mildly decreased.  - Echo 02/12/19: EF 25-30%, RV mildly reduced - Echo 11/21 EF 20-25% RV mild HK. - Echo 2022 EF < 20% RV normal - Blood Type A +  - NYHA III. Reds CLip 27%. Volume status stable.  Continue torsemide 60 mg daily + 20  mEq of KCl daily.  Protect weight 103-104 pounds.  - Tried entresto 2019 but stopped due to hypoerkalemia .  - Continue 12.5 mg spiro daily - Continue corlanor 7.5 mg bid. - Continue Farxiga 10 mg daily. - No b-blocker yet due to intolerance with low output - She would like to pursue transplant. We discussed transplant, heart pump, home inotropes, verus palliative care +/- hospice.  I doubt she will be candidate due to lung issues but will obtain PFTS. If  lung disease prohibitive could consider Barotstim.  - She was given information on Barostim and our research team will follow if she would like to pursue.    4. Recurrent pleural effusions s/p  pleurodesis Resolved  5. Chronic hypoxic respiratory failure - Multifactorial - Wears O2 PRN at home.  - Sats stable on room air.   - She is followed by pulmonology.   5. PAF - CHA2DS2/VASc is at least 4. (CHF, Vasc disease, HTN, Female).  - She has history of Afib RVR in the past in the setting of her Lung CA.  - Previously on Xarelto but stopped due to hemoptysis.  -Regular on exam.   - No bleeding issues.  - On ASA.  6. 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  - Chest CT 02/19/18 with mass-like consolidation in R hilum concerning for recurrent tumor.  - Repeat CT 03/27/2018 -No definite findings of locally recurrent tumor in the right lung. 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 Continued chest CT surveillance is advised in 3-6 months. - S/p thoracentesis 04/2018. Cytology negative for malignancy.  - CTA 11/19 stable scarring in the right hilum consistent with emphysema. Bronchoscopy on 11/15, no obvious source of bleeding   Check BMET/BNP today.  I asked VAD coordinator to review chart. Will check PFTs. If PFTs prohibitive will consider Barostim.   I also asked Research Nurse to evaluate for Barostim to see what are the options.   Follow up in 6 weeks with Dr [name].     Jeanmarie Hubert, NP-C 05/11/2021 2:11 PM

## 2021-05-11 ENCOUNTER — Encounter (HOSPITAL_COMMUNITY): Payer: Self-pay

## 2021-05-11 ENCOUNTER — Ambulatory Visit (HOSPITAL_COMMUNITY)
Admission: RE | Admit: 2021-05-11 | Discharge: 2021-05-11 | Disposition: A | Discharge status: Home / Self Care | Payer: BC Managed Care – PPO | Source: Ambulatory Visit | Attending: Adult Health | Admitting: Adult Health

## 2021-05-11 ENCOUNTER — Other Ambulatory Visit: Payer: Self-pay

## 2021-05-11 ENCOUNTER — Encounter: Payer: Self-pay | Admitting: *Deleted

## 2021-05-11 VITALS — BP 102/58 | HR 72 | Wt 106.2 lb

## 2021-05-11 DIAGNOSIS — I252 Old myocardial infarction: Secondary | ICD-10-CM | POA: Insufficient documentation

## 2021-05-11 DIAGNOSIS — Z95810 Presence of automatic (implantable) cardiac defibrillator: Secondary | ICD-10-CM | POA: Insufficient documentation

## 2021-05-11 DIAGNOSIS — I480 Paroxysmal atrial fibrillation: Secondary | ICD-10-CM | POA: Diagnosis not present

## 2021-05-11 DIAGNOSIS — I255 Ischemic cardiomyopathy: Secondary | ICD-10-CM | POA: Diagnosis not present

## 2021-05-11 DIAGNOSIS — Z8249 Family history of ischemic heart disease and other diseases of the circulatory system: Secondary | ICD-10-CM | POA: Diagnosis not present

## 2021-05-11 DIAGNOSIS — Z833 Family history of diabetes mellitus: Secondary | ICD-10-CM | POA: Diagnosis not present

## 2021-05-11 DIAGNOSIS — Z885 Allergy status to narcotic agent status: Secondary | ICD-10-CM | POA: Diagnosis not present

## 2021-05-11 DIAGNOSIS — Z87891 Personal history of nicotine dependence: Secondary | ICD-10-CM | POA: Insufficient documentation

## 2021-05-11 DIAGNOSIS — I110 Hypertensive heart disease with heart failure: Secondary | ICD-10-CM | POA: Diagnosis not present

## 2021-05-11 DIAGNOSIS — Z955 Presence of coronary angioplasty implant and graft: Secondary | ICD-10-CM | POA: Diagnosis not present

## 2021-05-11 DIAGNOSIS — I509 Heart failure, unspecified: Secondary | ICD-10-CM | POA: Diagnosis not present

## 2021-05-11 DIAGNOSIS — Z951 Presence of aortocoronary bypass graft: Secondary | ICD-10-CM | POA: Insufficient documentation

## 2021-05-11 DIAGNOSIS — I2510 Atherosclerotic heart disease of native coronary artery without angina pectoris: Secondary | ICD-10-CM | POA: Diagnosis not present

## 2021-05-11 DIAGNOSIS — Z85118 Personal history of other malignant neoplasm of bronchus and lung: Secondary | ICD-10-CM | POA: Insufficient documentation

## 2021-05-11 DIAGNOSIS — Z923 Personal history of irradiation: Secondary | ICD-10-CM | POA: Insufficient documentation

## 2021-05-11 DIAGNOSIS — Z7984 Long term (current) use of oral hypoglycemic drugs: Secondary | ICD-10-CM | POA: Diagnosis not present

## 2021-05-11 DIAGNOSIS — Z7982 Long term (current) use of aspirin: Secondary | ICD-10-CM | POA: Diagnosis not present

## 2021-05-11 DIAGNOSIS — Z79899 Other long term (current) drug therapy: Secondary | ICD-10-CM | POA: Diagnosis not present

## 2021-05-11 DIAGNOSIS — E119 Type 2 diabetes mellitus without complications: Secondary | ICD-10-CM | POA: Diagnosis not present

## 2021-05-11 DIAGNOSIS — Z9221 Personal history of antineoplastic chemotherapy: Secondary | ICD-10-CM | POA: Insufficient documentation

## 2021-05-11 DIAGNOSIS — J449 Chronic obstructive pulmonary disease, unspecified: Secondary | ICD-10-CM | POA: Insufficient documentation

## 2021-05-11 DIAGNOSIS — I5022 Chronic systolic (congestive) heart failure: Secondary | ICD-10-CM | POA: Insufficient documentation

## 2021-05-11 DIAGNOSIS — Z006 Encounter for examination for normal comparison and control in clinical research program: Secondary | ICD-10-CM

## 2021-05-11 DIAGNOSIS — F1290 Cannabis use, unspecified, uncomplicated: Secondary | ICD-10-CM | POA: Diagnosis not present

## 2021-05-11 DIAGNOSIS — J9611 Chronic respiratory failure with hypoxia: Secondary | ICD-10-CM | POA: Diagnosis not present

## 2021-05-11 LAB — BASIC METABOLIC PANEL
Anion gap: 10 (ref 5–15)
BUN: 19 mg/dL (ref 6–20)
CO2: 29 mmol/L (ref 22–32)
Calcium: 10 mg/dL (ref 8.9–10.3)
Chloride: 99 mmol/L (ref 98–111)
Creatinine, Ser: 1.11 mg/dL — ABNORMAL HIGH (ref 0.44–1.00)
GFR, Estimated: 57 mL/min — ABNORMAL LOW (ref >60)
Glucose, Bld: 100 mg/dL — ABNORMAL HIGH (ref 70–99)
Potassium: 3.9 mmol/L (ref 3.5–5.1)
Sodium: 138 mmol/L (ref 135–145)

## 2021-05-11 LAB — BRAIN NATRIURETIC PEPTIDE: B Natriuretic Peptide: 836 pg/mL — ABNORMAL HIGH (ref 0.0–100.0)

## 2021-05-11 NOTE — Progress Notes (Signed)
EPIC Encounter for ICM Monitoring  Patient Name: Brittany [name] is a 60 y.o. female Date: 05/11/2021 Primary Care Physican: Maude Leriche, PA-C Primary Cardiologist:Bensimhon Electrophysiologist:Allred 5/3/2022Weight: 103lbs(baseline 103 lbs)  Transmission reviewed.  Patient seen in HF clinic office 05/11/2021  OptivolThoracic impedancetrending along baseline normal  Prescribed:   Torsemide20 mgTake3 tablets (60 mg total)daily.   Potassium 20 mEq take2tabletsby mouth daily. Take 2tablets (40 mEq total)with Metolazone  Metolazone 2.5 mgtake as directed  Labs: 05/11/2021 Creatinine 1.11, BUN 19, Potassium 3.9, Sodium 138, GFR 57 04/11/2021 Creatinine1.12, BUN33, Potassium4.3, Sodium138, GFR56 04/10/2021 Creatinine1.31, BUN35, Potassium3.6, Sodium136, JSC38  04/09/2021 Creatinine0.96, BUN19, Potassium3.7, Sodium138, GFR>60  04/08/2021 Creatinine1.23, BUN20, Potassium3.1, Sodium138, GFR50 03/14/2021 Creatinine1.28, BUN22, Potassium4.1, Sodium139, GFR48 03/01/2021 Creatinine1.07, BUN17, Potassium3.1, Sodium137, GFR59 02/12/2021 Creatinine1.12, BUN33, Potassium3.7, Sodium135, PJR93 A complete set of results can be found in Results Review.  Recommendations:No changes.  Follow-up plan: ICM clinic phone appointment on7/04/2021. 91 day device clinic remote transmission7/26/2022.  EP/Cardiology Office Visits:  07/01/2021 withDr Bensimhon.  Copy of ICM check sent to Dr.Allred.  3 month ICM trend: 05/11/2021.    1 Year ICM trend:       [name], RN 05/11/2021 4:28 PM

## 2021-05-11 NOTE — Research (Addendum)
Spoke with patient about Barostim, reviewed study with her.  Sent her home with pamphlet and consent to look through.  She will call me back if wants to participate.   Due to restrictive lung disease that excludes her from research.  Will follow up with Md's to see if eligible for commercial side

## 2021-05-11 NOTE — Progress Notes (Signed)
  station A Ruler 27 Reading 27%

## 2021-05-11 NOTE — Patient Instructions (Addendum)
It was great to see you today! No medication changes are needed at this time.   Labs today We will only contact you if something comes back abnormal or we need to make some changes. Otherwise no news is good news!   Your physician has recommended that you have a pulmonary function test. Pulmonary Function Tests are a group of tests that measure how well air moves in and out of your lungs.  You will need a pre procedure COVID test    WHEN:              anytime between 9am-3pm WHERE: COVID Test Site [address] [place], [place] [address]  This is a drive thru testing site, you will remain in your car. Be sure to get in the line FOR PROCEDURES Once you have been swabbed you will need to remain home in quarantine until you return for your procedure.  Keep follow up as scheduled  Do the following things EVERYDAY: 1) Weigh yourself in the morning before breakfast. Write it down and keep it in a log. 2) Take your medicines as prescribed 3) Eat low salt foods--Limit salt (sodium) to 2000 mg per day.  4) Stay as active as you can everyday 5) Limit all fluids for the day to less than 2 liters  At the [facility], you and your health needs are our priority. As part of our continuing mission to provide you with exceptional heart care, we have created designated Provider Care Teams. These Care Teams include your primary Cardiologist (physician) and Advanced Practice Providers (APPs- Physician Assistants and Nurse Practitioners) who all work together to provide you with the care you need, when you need it.   You may see any of the following providers on your designated Care Team at your next follow up: Marland Kitchen Dr Glori Bickers . Dr Loralie Champagne . Dr Vickki Muff . Darrick Grinder, NP . Lyda Jester, [place] . Audry Riles, PharmD   Please be sure to bring in all your medications bottles to every appointment.   If you have any questions or concerns  before your next appointment please send [place] a message through [place] or call our office at [phone].    TO LEAVE A MESSAGE FOR THE NURSE SELECT OPTION 2, PLEASE LEAVE A MESSAGE INCLUDING: . YOUR NAME . DATE OF BIRTH . CALL BACK NUMBER . REASON FOR CALL**this is important as we prioritize the call backs  YOU WILL RECEIVE A CALL BACK THE SAME DAY AS LONG AS YOU CALL BEFORE 4:00 PM

## 2021-05-16 ENCOUNTER — Other Ambulatory Visit (HOSPITAL_COMMUNITY)
Admission: RE | Admit: 2021-05-16 | Discharge: 2021-05-16 | Disposition: A | Discharge status: Home / Self Care | Payer: BC Managed Care – PPO | Source: Ambulatory Visit | Attending: Adult Health | Admitting: Adult Health

## 2021-05-16 DIAGNOSIS — Z20822 Contact with and (suspected) exposure to covid-19: Secondary | ICD-10-CM | POA: Diagnosis not present

## 2021-05-16 DIAGNOSIS — Z01812 Encounter for preprocedural laboratory examination: Secondary | ICD-10-CM | POA: Diagnosis not present

## 2021-05-16 LAB — SARS CORONAVIRUS 2 (TAT 6-24 HRS): SARS Coronavirus 2: NEGATIVE

## 2021-05-18 ENCOUNTER — Ambulatory Visit (HOSPITAL_COMMUNITY)
Admission: RE | Admit: 2021-05-18 | Discharge: 2021-05-18 | Disposition: A | Discharge status: Home / Self Care | Payer: BC Managed Care – PPO | Source: Ambulatory Visit | Attending: Adult Health | Admitting: Adult Health

## 2021-05-18 ENCOUNTER — Other Ambulatory Visit: Payer: Self-pay

## 2021-05-18 DIAGNOSIS — I509 Heart failure, unspecified: Secondary | ICD-10-CM | POA: Diagnosis not present

## 2021-05-18 LAB — PULMONARY FUNCTION TEST
DL/VA % pred: 82 %
DL/VA: 3.6 ml/min/mmHg/L
DLCO unc % pred: 60 %
DLCO unc: 10.47 ml/min/mmHg
FEF 25-75 Post: 0.83 L/sec
FEF 25-75 Pre: 0.45 L/sec
FEF2575-%Change-Post: 83 %
FEF2575-%Pred-Post: 46 %
FEF2575-%Pred-Pre: 25 %
FEV1-%Change-Post: 17 %
FEV1-%Pred-Post: 68 %
FEV1-%Pred-Pre: 57 %
FEV1-Post: 1.16 L
FEV1-Pre: 0.98 L
FEV1FVC-%Change-Post: 5 %
FEV1FVC-%Pred-Pre: 76 %
FEV6-%Change-Post: 14 %
FEV6-%Pred-Post: 86 %
FEV6-%Pred-Pre: 75 %
FEV6-Post: 1.8 L
FEV6-Pre: 1.57 L
FEV6FVC-%Change-Post: 2 %
FEV6FVC-%Pred-Post: 103 %
FEV6FVC-%Pred-Pre: 101 %
FVC-%Change-Post: 11 %
FVC-%Pred-Post: 83 %
FVC-%Pred-Pre: 74 %
FVC-Post: 1.81 L
FVC-Pre: 1.62 L
Post FEV1/FVC ratio: 64 %
Post FEV6/FVC ratio: 100 %
Pre FEV1/FVC ratio: 61 %
Pre FEV6/FVC Ratio: 97 %
RV % pred: 109 %
RV: 1.94 L
TLC % pred: 81 %
TLC: 3.57 L

## 2021-05-18 MED ORDER — ALBUTEROL SULFATE (2.5 MG/3ML) 0.083% IN NEBU
2.5000 mg | INHALATION_SOLUTION | Freq: Once | RESPIRATORY_TRACT | Status: AC
  Administered 2021-05-18: 2.5 mg via RESPIRATORY_TRACT

## 2021-06-09 ENCOUNTER — Other Ambulatory Visit (HOSPITAL_COMMUNITY): Payer: Self-pay

## 2021-06-09 ENCOUNTER — Telehealth (HOSPITAL_COMMUNITY): Payer: Self-pay | Admitting: Pharmacy Technician

## 2021-06-09 NOTE — Telephone Encounter (Signed)
Advanced Heart Failure Patient Advocate Encounter  I received a PA request for Corlanor. Patient's PA is going to expire 7/1. I have tried to submit the PA several times and the demographic information we have in Epic does not match what the insurance has.  I reached out to the patient and she provided updated address information that also did not match. Stated that she has new insurance now. When I was taking the insurance information down, the information that was given does not match pharmacy related typical billing.  I advised the patient to send me a card with the new information so that I can hopefully submit this request for her.

## 2021-06-09 NOTE — Telephone Encounter (Signed)
Advanced Heart Failure Patient Advocate Encounter  Patient sent picture and when I entered the information given, it says that the patient is inactive.  Called and spoke with patient. She is going to reach out to the insurance and see whats going on.  Charlann Boxer, CPhT

## 2021-06-14 ENCOUNTER — Ambulatory Visit (INDEPENDENT_AMBULATORY_CARE_PROVIDER_SITE_OTHER): Payer: BC Managed Care – PPO

## 2021-06-14 DIAGNOSIS — Z95810 Presence of automatic (implantable) cardiac defibrillator: Secondary | ICD-10-CM | POA: Diagnosis not present

## 2021-06-14 DIAGNOSIS — I5022 Chronic systolic (congestive) heart failure: Secondary | ICD-10-CM | POA: Diagnosis not present

## 2021-06-14 NOTE — Progress Notes (Signed)
EPIC Encounter for ICM Monitoring  Patient Name: Brittany [name] is a 60 y.o. female Date: 06/14/2021 Primary Care Physican: Maude Leriche, [place] Primary Cardiologist: [place] Electrophysiologist: Allred 06/14/2021 Weight: 103 lbs (baseline 103 lbs)   Spoke with patient and heart failure questions reviewed.  Pt reports feeling like a band around the rib area under the breast when she has fluid and she has felt that this week.   Optivol Thoracic impedance trending slightly below baseline normal.   Prescribed: Torsemide 20 mg Take 3 tablets (60 mg total) daily. Potassium 20 mEq take 2 tablets by mouth daily.  Take 2 tablets (40 mEq total) with Metolazone Metolazone 2.5 mg take by mouth as needed for fluid  Spironolactone 25 mg take 0.5 tablet by mouth daily Farxiga 10 mg take 1 tablet daily   Labs: 05/11/2021 Creatinine 1.11, BUN 19, Potassium 3.9, Sodium 138, GFR 57 04/11/2021 Creatinine 1.12, BUN 33, Potassium 4.3, Sodium 138, GFR 56 04/10/2021 Creatinine 1.31, BUN 35, Potassium 3.6, Sodium 136, GFR 47 04/09/2021 Creatinine 0.96, BUN 19, Potassium 3.7, Sodium 138, GFR >60 04/08/2021 Creatinine 1.23, BUN 20, Potassium 3.1, Sodium 138, GFR 50  03/14/2021 Creatinine 1.28, BUN 22, Potassium 4.1, Sodium 139, GFR 48 03/01/2021 Creatinine 1.07, BUN 17, Potassium 3.1, Sodium 137, GFR 59  02/12/2021 Creatinine 1.12, BUN 33, Potassium 3.7, Sodium 135, GFR 56 A complete set of results can be found in Results Review.   Recommendations:  She will adjust Torsemide and take 1 extra tablet for 2 days.  Copy sent to Dr [name] as Juluis Rainier.   Follow-up plan: ICM clinic phone appointment on 06/27/2021 to recheck fluid levels.   91 day device clinic remote transmission 07/05/2021.     EP/Cardiology Office Visits:  07/01/2021 with Dr [name].   Copy of ICM check sent to Dr. Rayann Heman.   3 month ICM trend: 06/14/2021.    1 Year ICM trend:       [name], RN 06/14/2021 1:59 PM

## 2021-06-27 ENCOUNTER — Ambulatory Visit (INDEPENDENT_AMBULATORY_CARE_PROVIDER_SITE_OTHER): Payer: Medicare Other

## 2021-06-27 DIAGNOSIS — Z95810 Presence of automatic (implantable) cardiac defibrillator: Secondary | ICD-10-CM

## 2021-06-27 DIAGNOSIS — I5022 Chronic systolic (congestive) heart failure: Secondary | ICD-10-CM

## 2021-06-29 NOTE — Progress Notes (Signed)
EPIC Encounter for ICM Monitoring  Patient Name: Kieara Schwark is a 60 y.o. female Date: 06/29/2021 Primary Care Physican: Maude Leriche, [place] Primary Cardiologist: [place] Electrophysiologist: [place] 06/14/2021 Weight: 103 lbs (baseline 103 lbs)   Spoke with patient and heart failure questions reviewed.  Pt asymptomatic for fluid accumulation and feeling well.   Optivol Thoracic impedance fluid levels returned to normal.   Prescribed: Torsemide 20 mg Take 3 tablets (60 mg total) daily. Potassium 20 mEq take 2 tablets by mouth daily.  Take 2 tablets (40 mEq total) with Metolazone Metolazone 2.5 mg take by mouth as needed for fluid  Spironolactone 25 mg take 0.5 tablet by mouth daily Farxiga 10 mg take 1 tablet daily   Labs: 05/11/2021 Creatinine 1.11, BUN 19, Potassium 3.9, Sodium 138, GFR 57 04/11/2021 Creatinine 1.12, BUN 33, Potassium 4.3, Sodium 138, GFR 56 04/10/2021 Creatinine 1.31, BUN 35, Potassium 3.6, Sodium 136, GFR 47 04/09/2021 Creatinine 0.96, BUN 19, Potassium 3.7, Sodium 138, GFR >60 04/08/2021 Creatinine 1.23, BUN 20, Potassium 3.1, Sodium 138, GFR 50  03/14/2021 Creatinine 1.28, BUN 22, Potassium 4.1, Sodium 139, GFR 48 03/01/2021 Creatinine 1.07, BUN 17, Potassium 3.1, Sodium 137, GFR 59  02/12/2021 Creatinine 1.12, BUN 33, Potassium 3.7, Sodium 135, GFR 56 A complete set of results can be found in Results Review.   Recommendations:  No changes and encouraged to call if experiencing any fluid symptoms.   Follow-up plan: ICM clinic phone appointment on 07/18/2021.   91 day device clinic remote transmission 07/05/2021.     EP/Cardiology Office Visits:  07/01/2021 with Dr [name].   Copy of ICM check sent to Dr. Rayann Heman.   3 month ICM trend: 06/26/2021.    1 Year ICM trend:       [name], RN 06/29/2021 3:25 PM

## 2021-07-01 ENCOUNTER — Ambulatory Visit (HOSPITAL_COMMUNITY)
Admission: RE | Admit: 2021-07-01 | Discharge: 2021-07-01 | Disposition: A | Discharge status: Home / Self Care | Payer: BC Managed Care – PPO | Source: Ambulatory Visit | Attending: Internal Medicine | Admitting: Internal Medicine

## 2021-07-01 ENCOUNTER — Encounter (HOSPITAL_COMMUNITY): Payer: Self-pay | Admitting: Internal Medicine

## 2021-07-01 ENCOUNTER — Other Ambulatory Visit: Payer: Self-pay

## 2021-07-01 VITALS — BP 110/60 | HR 63 | Wt 107.4 lb

## 2021-07-01 DIAGNOSIS — Z7984 Long term (current) use of oral hypoglycemic drugs: Secondary | ICD-10-CM | POA: Diagnosis not present

## 2021-07-01 DIAGNOSIS — I252 Old myocardial infarction: Secondary | ICD-10-CM | POA: Insufficient documentation

## 2021-07-01 DIAGNOSIS — Z7982 Long term (current) use of aspirin: Secondary | ICD-10-CM | POA: Insufficient documentation

## 2021-07-01 DIAGNOSIS — Z95810 Presence of automatic (implantable) cardiac defibrillator: Secondary | ICD-10-CM | POA: Diagnosis not present

## 2021-07-01 DIAGNOSIS — Z7901 Long term (current) use of anticoagulants: Secondary | ICD-10-CM | POA: Insufficient documentation

## 2021-07-01 DIAGNOSIS — Z951 Presence of aortocoronary bypass graft: Secondary | ICD-10-CM | POA: Insufficient documentation

## 2021-07-01 DIAGNOSIS — I2510 Atherosclerotic heart disease of native coronary artery without angina pectoris: Secondary | ICD-10-CM | POA: Insufficient documentation

## 2021-07-01 DIAGNOSIS — J90 Pleural effusion, not elsewhere classified: Secondary | ICD-10-CM | POA: Insufficient documentation

## 2021-07-01 DIAGNOSIS — J9611 Chronic respiratory failure with hypoxia: Secondary | ICD-10-CM | POA: Insufficient documentation

## 2021-07-01 DIAGNOSIS — Z885 Allergy status to narcotic agent status: Secondary | ICD-10-CM | POA: Diagnosis not present

## 2021-07-01 DIAGNOSIS — Z8249 Family history of ischemic heart disease and other diseases of the circulatory system: Secondary | ICD-10-CM | POA: Insufficient documentation

## 2021-07-01 DIAGNOSIS — E875 Hyperkalemia: Secondary | ICD-10-CM | POA: Insufficient documentation

## 2021-07-01 DIAGNOSIS — Z955 Presence of coronary angioplasty implant and graft: Secondary | ICD-10-CM | POA: Insufficient documentation

## 2021-07-01 DIAGNOSIS — Z87891 Personal history of nicotine dependence: Secondary | ICD-10-CM | POA: Diagnosis not present

## 2021-07-01 DIAGNOSIS — I110 Hypertensive heart disease with heart failure: Secondary | ICD-10-CM | POA: Insufficient documentation

## 2021-07-01 DIAGNOSIS — J440 Chronic obstructive pulmonary disease with acute lower respiratory infection: Secondary | ICD-10-CM | POA: Insufficient documentation

## 2021-07-01 DIAGNOSIS — Z79899 Other long term (current) drug therapy: Secondary | ICD-10-CM | POA: Insufficient documentation

## 2021-07-01 DIAGNOSIS — I5022 Chronic systolic (congestive) heart failure: Secondary | ICD-10-CM | POA: Diagnosis not present

## 2021-07-01 DIAGNOSIS — I480 Paroxysmal atrial fibrillation: Secondary | ICD-10-CM | POA: Diagnosis not present

## 2021-07-01 NOTE — Progress Notes (Signed)
Advanced HF Clinic Note   PCP:  Scifres, Dorothy, PA-C  Cardiologist:  None Primary HF: Dr [name] Oncology: Dr [name]   Chief Complaint: Heart Failure  History of Present Illness: Brittany [name] is a 60 y.o. 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 Underwent emergent cath 02/03/18 showed LAD 100% stenosed, LCx 95% stenosed. Taken for emergent CABG 02/03/18. Required impella post op. Hospital course complicated by cardiogenic shock, HCAP, A fib, respiratory failure, and swallowing issues. She was discharged to SNF. Discharge weight 103 pounds.    In 2019 had multiple hospitalizations for HF and pleural effusion. Underwent pleurodesis at [facility].   Admitted 3/20 with NSTEMI and HF. Received DES to ostial ramus into distal left main based on cath below and underwent diuresis. Meds adjusted as tolerated. Echo with EF 25-30%.   Admitted 10/2020 with COPD flare with mild HF and hemoptysis. CTA showed stable scarring in the right hilum consistent with emphysema.. Underwent bronchoscopy on 11/15, no obvious source of bleeding found on examination.   Echo 11/21 EF 20-25%. RV mildly HK.   Had post hospital f/u on 11/10/20 and was fluid overloaded on device interrogation. She was instructed to take Metolazone 2.55m x 2 days + 40 mEq KCl and torsemide was increased from 40>>60 mg daily.   Had return f/u again on 11/15/20 and was felt to be dry/ over diuresed. Optivol fluid index was way down. Labs were c/w AKI. SCr had bumped from 0.95>>1.82. BUN 14>>38. She was instructed to stop metolazone and hold torsemide x 2 days, then resume at alternating dosing 60 mg one day and 40 mg the next.  Admitted 02/09/21 with chest pain and shortness of breath. HS-Trop with  no trend. Echo with EF < 20%. Treated for AECOPD exacerbation. Breathing improved. Discharged on torsemide 60 mg daily.  Admitted 04/08/21 with ADHF. Diuresed with IV lasix and transitioned back to torsemide 80 mg daily x 4 days then down to torsemide 60 mg daily.   Last visit hospital HF follow up.Overall feeling terrible, dyspnea with minimal exertion. Wanted to be considered for a heart transplant or anything that could help.   Feeling okay currently.  Yesterday had some chest discomfort and back discomfort lasting about 15 minutes that occurred while she was sitting down surfing the web.  Her pain did not radiate.  No chest pain today.  Still having dyspnea on exertion after about a city block of walking and will have to stop and rest.  Having some dizziness and lightheadedness occasionally, worse over the last few days.  Weight has been stable, has not had to use metolazone.    Cardidac Studies: ECHO 2022: EF < 20% RV normal  ECHO 2021: EF 20-25% RV mildly reduced.  ECHO 02/12/19: EF 25-30%, RV mildly reduced ECHO 02/10/21: EF <20%, G2DD  05/18/2021 PFT's: moderately severe airway obstruction and a diffusion defect suggesting emphysema.   LHC 02/12/19: Ost LAD to Prox LAD lesion is 100% stenosed. Ost Cx to Prox Cx lesion is 100% stenosed. Ost Ramus lesion is 99% stenosed. Post intervention, there is a 0% residual stenosis. A drug-eluting stent was successfully placed using a [id number]. SVG and is normal in caliber. The graft exhibits no disease. Ost 1st Diag lesion is  70% stenosed. SVG and is normal in caliber. Prox Graft lesion is 100% stenosed. 1.  Significant underlying two-vessel coronary artery disease with patent SVG to OM and SVG to diagonal which supplies the LAD territory.  Atretic LIMA to LAD given competitive flow from SVG to diagonal.  Severe ostial ramus artery stenosis not supplied by bypass with his area suggestive of plaque rupture. 2.  Severely elevated  left ventricular end-diastolic pressure 34 mmHg.  Left ventricular angiography was not performed. 3.  Successful angioplasty and drug-eluting stent placement to the ostial ramus extending into the distal left main.  [place] 2019  RA = 8 RV = 49/9 PA = 47/20 (32) PCW = 19 Fick cardiac output/index = 4.3/3.1 PVR = 3.0 wu AO sat = 93% PA sat = 62%, 64%  Assessment: 1. Minimally elevated left heart pressures 2. Mild PAH 3. Normal cardiac output     Past Medical History:  Diagnosis Date   Acute systolic congestive heart failure ([place]) 02/03/2018   Allergy    Anxiety    Asthma    DM2 (diabetes mellitus, type 2) ([place]) 10/20/2020   Hypertension    PAF (paroxysmal atrial fibrillation) ([place])    Prophylactic measure 08/03/14-08/19/14   Prophyl. 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   LIMA to LAD, SVG to D1, SVG to OM1, EVH via right thigh with implantation of Impella LD LVAD via direct aortic approach   Small cell lung cancer ([place]) 03/16/2014   Past Surgical History:  Procedure Laterality Date   BRONCHIAL BRUSHINGS  10/25/2020   Procedure: BRONCHIAL BRUSHINGS;  Surgeon: Collene Gobble, MD;  [facility] END[facility]COPY;  Service: Pulmonary;;   BRONCHIAL NEEDLE ASPIRATION BIOPSY  10/25/2020   Procedure: BRONCHIAL NEEDLE ASPIRATION BIOPSIES;  Surgeon: Collene Gobble, MD;  Location: [place] ENDOSCOPY;  Service: Pulmonary;;   CARDIAC DEFIBRILLATOR PLACEMENT  08/15/2018   MDT Visia AF MRI VR ICD implanted by Dr Loel Lofty for primary prevention of sudden   CESAREAN SECTION     CORONARY ARTERY BYPASS GRAFT N/A 02/03/2018   Procedure: CORONARY ARTERY BYPASS GRAFTING (CABG);  Surgeon: Rexene Alberts, MD;  Location: [place];  Service: Open Heart Surgery;  Laterality: N/A;  Time 3 using left internal mammary artery and endoscopically harvested right saphenous vein   CORONARY BALLOON ANGIOPLASTY N/A 02/03/2018   Procedure: CORONARY BALLOON ANGIOPLASTY;  Surgeon: Martinique, Peter M, MD;  Location:  [place] CV LAB;  Service: Cardiovascular;  Laterality: N/A;   CORONARY STENT INTERVENTION N/A 02/12/2019   Procedure: CORONARY STENT INTERVENTION;  Surgeon: Wellington Hampshire, MD;  Location: [place] CV LAB;  Service: Cardiovascular;  Laterality: N/A;   CORONARY/GRAFT ACUTE MI REVASCULARIZATION N/A 02/03/2018   Procedure: Coronary/Graft Acute MI Revascularization;  Surgeon: Martinique, Peter M, MD;  Location: [place] CV LAB;  Service: Cardiovascular;  Laterality: N/A;   ENDOBRONCHIAL ULTRASOUND N/A 10/25/2020   Procedure: ENDOBRONCHIAL ULTRASOUND;  Surgeon: Collene Gobble, MD;  Location: [facility] ENDOSCOPY;  Service: Pulmonary;  Laterality: N/A;   FLEXIBLE BRONCHOSCOPY  10/25/2020   Procedure: FLEXIBLE BRONCHOSCOPY;  Surgeon: Collene Gobble, MD;  Location: [facility] ENDOSCOPY;  Service: Pulmonary;;   IABP INSERTION N/A 02/03/2018   Procedure: IABP Insertion;  Surgeon: Martinique, Peter M, MD;  Location: [place] CV LAB;  Service: Cardiovascular;  Laterality: N/A;   INTRAOPERATIVE TRANSESOPHAGEAL ECHOCARDIOGRAM N/A 02/03/2018   Procedure: INTRAOPERATIVE TRANSESOPHAGEAL ECHOCARDIOGRAM;  Surgeon: Rexene Alberts, MD;  Location: [place];  Service: Open Heart Surgery;  Laterality: N/A;   LEFT HEART CATH AND CORONARY ANGIOGRAPHY N/A 02/03/2018   Procedure: LEFT HEART CATH AND CORONARY ANGIOGRAPHY;  Surgeon: Martinique, Peter M, MD;  Location: [place] CV LAB;  Service: Cardiovascular;  Laterality: N/A;   LEFT HEART CATH AND CORS/GRAFTS ANGIOGRAPHY N/A 02/12/2019   Procedure: LEFT HEART CATH AND CORS/GRAFTS ANGIOGRAPHY;  Surgeon: Wellington Hampshire, MD;  Location: [place] CV LAB;  Service: Cardiovascular;  Laterality: N/A;   MEDIASTINOSCOPY N/A 03/11/2014   Procedure: MEDIASTINOSCOPY;  Surgeon: Melrose Nakayama, MD;  Location: [place];  Service: Thoracic;  Laterality: N/A;   PLACEMENT OF IMPELLA LEFT VENTRICULAR ASSIST DEVICE  02/03/2018   Procedure: PLACEMENT OF [place] LEFT VENTRICULAR ASSIST DEVICE LD;  Surgeon:  Rexene Alberts, MD;  Location: [place];  Service: Open Heart Surgery;;   REMOVAL OF [place] LEFT VENTRICULAR ASSIST DEVICE N/A 02/08/2018   Procedure: REMOVAL OF [place] LEFT VENTRICULAR ASSIST DEVICE;  Surgeon: Rexene Alberts, MD;  Location: [place];  Service: Open Heart Surgery;  Laterality: N/A;   RIGHT HEART CATH N/A 02/03/2018   Procedure: RIGHT HEART CATH;  Surgeon: Martinique, Peter M, MD;  Location: [place] CV LAB;  Service: Cardiovascular;  Laterality: N/A;   RIGHT HEART CATH N/A 05/09/2018   Procedure: RIGHT HEART CATH;  Surgeon: Jolaine Artist, MD;  Location: [place] CV LAB;  Service: Cardiovascular;  Laterality: N/A;   TEE WITHOUT CARDIOVERSION N/A 02/08/2018   Procedure: TRANSESOPHAGEAL ECHOCARDIOGRAM (TEE);  Surgeon: Rexene Alberts, MD;  Location: [place];  Service: Open Heart Surgery;  Laterality: N/A;   TUBAL LIGATION     VIDEO BRONCHOSCOPY WITH ENDOBRONCHIAL ULTRASOUND N/A 03/11/2014   Procedure: VIDEO BRONCHOSCOPY WITH ENDOBRONCHIAL ULTRASOUND;  Surgeon: Melrose Nakayama, MD;  Location: [facility] OR;  Service: Thoracic;  Laterality: N/A;     Current Outpatient Medications  Medication Sig Dispense Refill   albuterol (VENTOLIN HFA) 108 (90 Base) MCG/ACT inhaler Inhale 2 puffs into the lungs every 6 (six) hours as needed for wheezing or shortness of breath. 1 Inhaler 0   ALPRAZolam (XANAX) 0.25 MG tablet Take 1 tablet (0.25 mg total) by mouth daily as needed for anxiety. 10 tablet 0   aspirin EC 81 MG tablet Take 1 tablet (81 mg total) by mouth daily. Swallow whole. 30 tablet 3   atorvastatin (LIPITOR) 80 MG tablet Take 1 tablet (80 mg total) by mouth daily at 6 PM. 30 tablet 1   blood glucose meter kit and supplies KIT Dispense based on patient and insurance preference. Use up to four times daily as directed. 1 each 0   dapagliflozin propanediol (FARXIGA) 10 MG TABS tablet Take 1 tablet (10 mg total) by mouth daily before breakfast. 30 tablet 6   Glucerna (GLUCERNA) LIQD Take 237  mLs by mouth daily in the afternoon.     ipratropium-albuterol (DUONEB) 0.5-2.5 (3) MG/3ML SOLN Take 3 mLs by nebulization 2 (two) times daily as needed (for shortness of breath).     ivabradine (CORLANOR) 7.5 MG TABS tablet Take 1 tablet (7.5 mg total) by mouth 2 (two) times daily with a meal. 60 tablet 5   metolazone (ZAROXOLYN) 2.5 MG tablet Take 2.5 mg by mouth as needed.     nitroGLYCERIN (NITROSTAT) 0.4 MG SL tablet Place 1 tablet (0.4 mg total) under the tongue every 5 (five) minutes as needed for chest pain. 90 tablet 5   potassium chloride SA (KLOR-CON) 20 MEQ tablet Take 20 mEq by mouth daily. With add'l 20  meq with metolazone     spironolactone (ALDACTONE) 25 MG tablet Take 0.5 tablets (12.5 mg total) by mouth daily. 45 tablet 3   torsemide (DEMADEX) 20 MG tablet Take 60 mg by mouth daily.     No current facility-administered medications for this encounter.    Allergies:   Codeine   Social History:  The patient  reports that she quit smoking about 7 years ago. Her smoking use included cigarettes. She has a 20.00 pack-year smoking history. She has never used smokeless tobacco. She reports current alcohol use of about 8.0 standard drinks of alcohol per week. She reports current drug use. Drug: Marijuana.   Family History:  The patient's family history includes Cancer in her maternal grandmother; Diabetes in her paternal grandmother; Heart attack in her mother; Heart disease in her mother; Hypertension in her maternal grandmother and mother.   ROS:  Please see the history of present illness.   All other systems are personally reviewed and negative.    Vitals:   07/01/21 1435  BP: 110/60  Pulse: 63  SpO2: 97%  Weight: 48.7 kg (107 lb 6.4 oz)   PHYSICAL EXAM: General:  Thin walked in the clinic.  No resp difficulty HEENT: normal Neck: supple. JVP 7-8  Carotids 2+ bilat; no bruits. No lymphadenopathy or thryomegaly appreciated. Cor: PMI nondisplaced. Regular rate & rhythm. No  rubs, gallops or murmurs. Lungs: clear Abdomen: soft, nontender, nondistended. No hepatosplenomegaly. No bruits or masses. Good bowel sounds. Extremities: no cyanosis, clubbing, rash, edema Neuro: alert & orientedx3, cranial nerves grossly intact. moves all 4 extremities w/o difficulty. Affect pleasant  EKG: SR 68  Recent Labs: 02/09/2021: ALT 16; TSH 0.765 04/10/2021: Hemoglobin 13.3; Magnesium 2.4; Platelets 202 05/11/2021: B Natriuretic Peptide 836.0; BUN 19; Creatinine, Ser 1.11; Potassium 3.9; Sodium 138  Personally reviewed   Wt Readings from Last 3 Encounters:  07/01/21 48.7 kg (107 lb 6.4 oz)  05/11/21 48.2 kg (106 lb 3.2 oz)  04/11/21 48.2 kg (106 lb 3.2 oz)    ASSESSMENT AND PLAN:  1. CAD  - Hx of NSTEMI/STEMI s/p Emergent CABG 02/03/18: Cath with severe 2v CAD as above with severe LV dysfunction/ICM. s/p Emergent CABG 02/03/18.   - Had NSTEMI and now s/p LHC 02/12/19 with DES to the ostial ramus extending into the distal left main.  - No longer on brillinta due to bleeding.  -On ASA.  -Continue hi-intensity statin.  2.  COPD - No wheezing on exam - On Duonebs  3.Chronic Systolic HF due to ICM. S/p Medtronic single chamber ICD. - Echo 09/04/18 ([facility]) LVEF 20%, Moderate AI, Mild MR, Mild TR, Severe LAE, RV mildly decreased.  - Echo 02/12/19: EF 25-30%, RV mildly reduced - Echo 11/21 EF 20-25% RV mild HK. - Echo 2022 EF < 20% RV normal - Blood Type A +  - NYHA III.  Volume status stable.  Continue torsemide 60 mg daily + 20  mEq of KCl daily.  Protect weight 103-104 pounds.  - Tried entresto 2019 but stopped due to hyperkalemia .  - Continue 12.5 mg spiro daily - Continue corlanor 7.5 mg bid. - Continue Farxiga 10 mg daily. - No b-blocker yet due to intolerance with low output - Not a candidate for advanced therapies with her degree of lung disease.  Also not a candidate for BATwire study for the same but we are trying to arrange commercial barostim.  Touched base with the team  today  4. Recurrent pleural effusions s/p pleurodesis Resolved  5. Chronic hypoxic respiratory failure - Multifactorial - Wears O2 PRN at home.  - Sats stable on room air.   - She is followed by pulmonology.    5. PAF - CHA2DS2/VASc is at least 4. (CHF, Vasc disease, HTN, Female).   - She has history of Afib RVR in the past in the setting of her Lung CA.  - Previously on Xarelto but stopped due to hemoptysis.  - Remains in nsr on ecg today  - No bleeding issues.  - On ASA.    6. 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  - Chest CT 02/19/18 with mass-like consolidation in R hilum concerning for recurrent tumor.  - Repeat CT 03/27/2018 -No definite findings of locally recurrent tumor in the right lung. 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 Continued chest CT surveillance is advised in 3-6 months. - S/p thoracentesis 04/2018. Cytology negative for malignancy.  - CTA 11/19 stable scarring in the right hilum consistent with emphysema. Bronchoscopy on 11/15, no obvious source of bleeding  Signed, Katherine Roan, MD 07/01/2021 2:44 PM  Patient seen and examined with the above-signed Advanced Practice Provider and/or Housestaff. I personally reviewed laboratory data, imaging studies and relevant notes. I independently examined the patient and formulated the important aspects of the plan. I have edited the note to reflect any of my changes or salient points. I have personally discussed the plan with the patient and/or family.  Doing ok. Still with NYHA III symptoms. Volume status ok. Has been turned down for VAD due to degree of lung disease and need for re-do sternotomy. She is interested in [place].  General:  Thin woman. No resp difficulty HEENT: normal Neck: supple. no JVD. Carotids 2+ bilat; no bruits. No lymphadenopathy or thryomegaly appreciated. Cor: PMI nondisplaced. Regular rate &  rhythm. No rubs, gallops or murmurs. Lungs: decreased throughout Abdomen: soft, nontender, nondistended. No hepatosplenomegaly. No bruits or masses. Good bowel sounds. Extremities: no cyanosis, clubbing, rash, edema Neuro: alert & orientedx3, cranial nerves grossly intact. moves all 4 extremities w/o difficulty. Affect pleasant  Stable NYHA III-IIIb symptoms in setting of advanced lung disease and HF. Volume status stable. GDMT limited but low output and hyperkalemia. Agree with Barostim if she will qualify. No current s/s of angina.   ICD interrogated in clinic personally. No AF/VT. Volume status ok.   Glori Bickers, MD  5:35 PM

## 2021-07-01 NOTE — Patient Instructions (Signed)
Please in follow up in 4 months   If you have any questions or concerns before your next appointment please send [place] a message through [place] or call our office at [phone].    TO LEAVE A MESSAGE FOR THE NURSE SELECT OPTION 2, PLEASE LEAVE A MESSAGE INCLUDING: YOUR NAME DATE OF BIRTH CALL BACK NUMBER REASON FOR CALL**this is important as we prioritize the call backs  YOU WILL RECEIVE A CALL BACK THE SAME DAY AS LONG AS YOU CALL BEFORE 4:00 PM    milAt the [facility], you and your health needs are our priority. As part of our continuing mission to provide you with exceptional heart care, we have created designated Provider Care Teams. These Care Teams include your primary Cardiologist (physician) and Advanced Practice Providers (APPs- Physician Assistants and Nurse Practitioners) who all work together to provide you with the care you need, when you need it.   You may see any of the following providers on your designated Care Team at your next follow up: Dr Glori Bickers Dr Loralie Champagne Dr Patrice Paradise, NP Lyda Jester, [place] Ginnie Smart Audry Riles, PharmD   Please be sure to bring in all your medications bottles to every appointment.

## 2021-07-05 ENCOUNTER — Ambulatory Visit (INDEPENDENT_AMBULATORY_CARE_PROVIDER_SITE_OTHER): Payer: BC Managed Care – PPO

## 2021-07-05 DIAGNOSIS — I255 Ischemic cardiomyopathy: Secondary | ICD-10-CM | POA: Diagnosis not present

## 2021-07-05 LAB — CUP PACEART REMOTE DEVICE CHECK
Battery Remaining Longevity: 114 mo
Battery Voltage: 3.03 V
Brady Statistic RV Percent Paced: 0.01 %
Date Time Interrogation Session: 20220726012404
HighPow Impedance: 68 Ohm
Implantable Lead Implant Date: 20190905
Implantable Lead Location: 753860
Implantable Pulse Generator Implant Date: 20190905
Lead Channel Impedance Value: 342 Ohm
Lead Channel Impedance Value: 456 Ohm
Lead Channel Pacing Threshold Amplitude: 0.875 V
Lead Channel Pacing Threshold Pulse Width: 0.4 ms
Lead Channel Sensing Intrinsic Amplitude: 24.25 mV
Lead Channel Sensing Intrinsic Amplitude: 24.25 mV
Lead Channel Setting Pacing Amplitude: 2 V
Lead Channel Setting Pacing Pulse Width: 0.4 ms
Lead Channel Setting Sensing Sensitivity: 0.3 mV

## 2021-07-10 ENCOUNTER — Other Ambulatory Visit (HOSPITAL_COMMUNITY): Payer: Self-pay | Admitting: Cardiology

## 2021-07-12 ENCOUNTER — Other Ambulatory Visit (HOSPITAL_COMMUNITY): Payer: Self-pay

## 2021-07-12 NOTE — Telephone Encounter (Signed)
Patient Provider for medicine requested is at another facility.

## 2021-07-17 NOTE — Progress Notes (Signed)
Electrophysiology Office Note Date: 07/19/2021  ID:  Brittany [name], DOB 10-08-1961, MRN [id number]  PCP: Maude Leriche, PA-C Primary Cardiologist: None Electrophysiologist: Thompson Grayer, MD   CC: Routine ICD follow-up  Brittany [name] is a 60 y.o. female seen today for Thompson Grayer, MD for routine electrophysiology followup and to discuss barostim.  Since last being seen in our clinic the patient reports doing OK. She had recent admission for CHF 04/08/21. Continues to have intermittent chest pain at times. Followed closely by Dr. [name]. Interested in barostim. Not research candidate with restrictive lung disease. She has mild SOB with moderate exertion, but mostly avoids more than mild exertion. She is able to walk 2-3 blocks at pace on a good day if it's not too hot. On bad days, it takes her "all day to change her sheets".  Mostly she gets help around the house.   She is not vaccinated from [place] and avoids exposure by mostly staying at home.   Device History: Medtronic Single Chamber ICD implanted 2019 for Chronic systolic CHF.   Past Medical History:  Diagnosis Date   Acute systolic congestive heart failure ([place]) 02/03/2018   Allergy    Anxiety    Asthma    DM2 (diabetes mellitus, type 2) ([place]) 10/20/2020   Hypertension    PAF (paroxysmal atrial fibrillation) ([place])    Prophylactic measure 08/03/14-08/19/14   Prophyl. 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   LIMA to LAD, SVG to D1, SVG to OM1, EVH via right thigh with implantation of Impella LD LVAD via direct aortic approach   Small cell lung cancer ([place]) 03/16/2014   Past Surgical History:  Procedure Laterality Date   BRONCHIAL BRUSHINGS  10/25/2020   Procedure: BRONCHIAL BRUSHINGS;  Surgeon: Collene Gobble, MD;  Location: [facility] ENDOSCOPY;  Service: Pulmonary;;   BRONCHIAL NEEDLE ASPIRATION BIOPSY  10/25/2020   Procedure: BRONCHIAL NEEDLE ASPIRATION BIOPSIES;  Surgeon: Collene Gobble,  MD;  Location: [facility] ENDOSCOPY;  Service: Pulmonary;;   CARDIAC DEFIBRILLATOR PLACEMENT  08/15/2018   MDT Visia AF MRI VR ICD implanted by Dr Loel Lofty for primary prevention of sudden   CESAREAN SECTION     CORONARY ARTERY BYPASS GRAFT N/A 02/03/2018   Procedure: CORONARY ARTERY BYPASS GRAFTING (CABG);  Surgeon: Rexene Alberts, MD;  Location: [place];  Service: Open Heart Surgery;  Laterality: N/A;  Time 3 using left internal mammary artery and endoscopically harvested right saphenous vein   CORONARY BALLOON ANGIOPLASTY N/A 02/03/2018   Procedure: CORONARY BALLOON ANGIOPLASTY;  Surgeon: Martinique, Peter M, MD;  Location: [place] CV LAB;  Service: Cardiovascular;  Laterality: N/A;   CORONARY STENT INTERVENTION N/A 02/12/2019   Procedure: CORONARY STENT INTERVENTION;  Surgeon: Wellington Hampshire, MD;  Location: [place] CV LAB;  Service: Cardiovascular;  Laterality: N/A;   CORONARY/GRAFT ACUTE MI REVASCULARIZATION N/A 02/03/2018   Procedure: Coronary/Graft Acute MI Revascularization;  Surgeon: Martinique, Peter M, MD;  Location: [place] CV LAB;  Service: Cardiovascular;  Laterality: N/A;   ENDOBRONCHIAL ULTRASOUND N/A 10/25/2020   Procedure: ENDOBRONCHIAL ULTRASOUND;  Surgeon: Collene Gobble, MD;  Location: [facility] ENDOSCOPY;  Service: Pulmonary;  Laterality: N/A;   FLEXIBLE BRONCHOSCOPY  10/25/2020   Procedure: FLEXIBLE BRONCHOSCOPY;  Surgeon: Collene Gobble, MD;  Location: [facility] ENDOSCOPY;  Service: Pulmonary;;   IABP INSERTION N/A 02/03/2018   Procedure: IABP Insertion;  Surgeon: Martinique, Peter M, MD;  Location: [place] CV LAB;  Service: Cardiovascular;  Laterality: N/A;  INTRAOPERATIVE TRANSESOPHAGEAL ECHOCARDIOGRAM N/A 02/03/2018   Procedure: INTRAOPERATIVE TRANSESOPHAGEAL ECHOCARDIOGRAM;  Surgeon: Rexene Alberts, MD;  Location: [place];  Service: Open Heart Surgery;  Laterality: N/A;   LEFT HEART CATH AND CORONARY ANGIOGRAPHY N/A 02/03/2018   Procedure: LEFT HEART CATH AND CORONARY ANGIOGRAPHY;   Surgeon: Martinique, Peter M, MD;  Location: [place] CV LAB;  Service: Cardiovascular;  Laterality: N/A;   LEFT HEART CATH AND CORS/GRAFTS ANGIOGRAPHY N/A 02/12/2019   Procedure: LEFT HEART CATH AND CORS/GRAFTS ANGIOGRAPHY;  Surgeon: Wellington Hampshire, MD;  Location: [place] CV LAB;  Service: Cardiovascular;  Laterality: N/A;   MEDIASTINOSCOPY N/A 03/11/2014   Procedure: MEDIASTINOSCOPY;  Surgeon: Melrose Nakayama, MD;  Location: [place];  Service: Thoracic;  Laterality: N/A;   PLACEMENT OF IMPELLA LEFT VENTRICULAR ASSIST DEVICE  02/03/2018   Procedure: PLACEMENT OF [place] LEFT VENTRICULAR ASSIST DEVICE LD;  Surgeon: Rexene Alberts, MD;  Location: [place];  Service: Open Heart Surgery;;   REMOVAL OF [place] LEFT VENTRICULAR ASSIST DEVICE N/A 02/08/2018   Procedure: REMOVAL OF [place] LEFT VENTRICULAR ASSIST DEVICE;  Surgeon: Rexene Alberts, MD;  Location: [place];  Service: Open Heart Surgery;  Laterality: N/A;   RIGHT HEART CATH N/A 02/03/2018   Procedure: RIGHT HEART CATH;  Surgeon: Martinique, Peter M, MD;  Location: [place] CV LAB;  Service: Cardiovascular;  Laterality: N/A;   RIGHT HEART CATH N/A 05/09/2018   Procedure: RIGHT HEART CATH;  Surgeon: Jolaine Artist, MD;  Location: [place] CV LAB;  Service: Cardiovascular;  Laterality: N/A;   TEE WITHOUT CARDIOVERSION N/A 02/08/2018   Procedure: TRANSESOPHAGEAL ECHOCARDIOGRAM (TEE);  Surgeon: Rexene Alberts, MD;  Location: [place];  Service: Open Heart Surgery;  Laterality: N/A;   TUBAL LIGATION     VIDEO BRONCHOSCOPY WITH ENDOBRONCHIAL ULTRASOUND N/A 03/11/2014   Procedure: VIDEO BRONCHOSCOPY WITH ENDOBRONCHIAL ULTRASOUND;  Surgeon: Melrose Nakayama, MD;  Location: [facility] OR;  Service: Thoracic;  Laterality: N/A;    Current Outpatient Medications  Medication Sig Dispense Refill   albuterol (VENTOLIN HFA) 108 (90 Base) MCG/ACT inhaler Inhale 2 puffs into the lungs every 6 (six) hours as needed for wheezing or shortness of breath. 1 Inhaler  0   ALPRAZolam (XANAX) 0.25 MG tablet Take 1 tablet (0.25 mg total) by mouth daily as needed for anxiety. 10 tablet 0   aspirin EC 81 MG tablet Take 1 tablet (81 mg total) by mouth daily. Swallow whole. 30 tablet 3   atorvastatin (LIPITOR) 80 MG tablet Take 1 tablet (80 mg total) by mouth daily at 6 PM. 30 tablet 1   blood glucose meter kit and supplies KIT Dispense based on patient and insurance preference. Use up to four times daily as directed. 1 each 0   dapagliflozin propanediol (FARXIGA) 10 MG TABS tablet Take 1 tablet (10 mg total) by mouth daily before breakfast. 30 tablet 6   Glucerna (GLUCERNA) LIQD Take 237 mLs by mouth daily in the afternoon.     ipratropium-albuterol (DUONEB) 0.5-2.5 (3) MG/3ML SOLN Take 3 mLs by nebulization 2 (two) times daily as needed (for shortness of breath).     ivabradine (CORLANOR) 7.5 MG TABS tablet Take 1 tablet (7.5 mg total) by mouth 2 (two) times daily with a meal. 60 tablet 5   metolazone (ZAROXOLYN) 2.5 MG tablet Take 2.5 mg by mouth as needed.     nitroGLYCERIN (NITROSTAT) 0.4 MG SL tablet Place 1 tablet (0.4 mg total) under the tongue every 5 (five) minutes  as needed for chest pain. 90 tablet 5   potassium chloride SA (KLOR-CON) 20 MEQ tablet Take 20 mEq by mouth daily. With add'l 20 meq with metolazone     spironolactone (ALDACTONE) 25 MG tablet Take 0.5 tablets (12.5 mg total) by mouth daily. 45 tablet 3   torsemide (DEMADEX) 20 MG tablet Take 60 mg by mouth daily.     No current facility-administered medications for this visit.    Allergies:   Codeine   Social History: Social History   Socioeconomic History   Marital status: Married    Spouse name: Michell Heinrich    Number of children: 1   Years of education: Not on file   Highest education level: Not on file  Occupational History   Not on file  Tobacco Use   Smoking status: Former    Packs/day: 1.00    Years: 20.00    Pack years: 20.00    Types: Cigarettes    Quit date:  03/13/2014    Years since quitting: 7.3   Smokeless tobacco: Never  Vaping Use   Vaping Use: Never used  Substance and Sexual Activity   Alcohol use: Yes    Alcohol/week: 8.0 standard drinks    Types: 8 Glasses of wine per week   Drug use: Yes    Types: Marijuana    Comment: "medicinal"   Sexual activity: Never  Other Topics Concern   Not on file  Social History Narrative   Not on file   Social Determinants of Health   Financial Resource Strain: Medium Risk   Difficulty of Paying Living Expenses: Somewhat hard  Food Insecurity: Food Insecurity Present   Worried About Charity fundraiser in the Last Year: Sometimes true   Arboriculturist in the Last Year: Never true  Transportation Needs: Unmet Transportation Needs   Lack of Transportation (Medical): No   Lack of Transportation (Non-Medical): Yes  Physical Activity: Insufficiently Active   Days of Exercise per Week: 2 days   Minutes of Exercise per Session: 10 min  Stress: Stress Concern Present   Feeling of Stress : To some extent  Social Connections: Unknown   Frequency of Communication with Friends and Family: More than three times a week   Frequency of Social Gatherings with Friends and Family: Twice a week   Attends Religious Services: Not on Electrical engineer or Organizations: Not on file   Attends Archivist Meetings: Never   Marital Status: Married  Human resources officer Violence: Not on file    Family History: Family History  Problem Relation Age of Onset   Heart disease Mother    Hypertension Mother    Heart attack Mother    Hypertension Maternal Grandmother    Cancer Maternal Grandmother    Diabetes Paternal Grandmother    Stroke Neg Hx     Review of Systems: All other systems reviewed and are otherwise negative except as noted above.   Physical Exam: Vitals:   07/19/21 0850  BP: 106/62  Pulse: 85  SpO2: 97%  Weight: 106 lb 9.6 oz (48.4 kg)  Height: 4' 11.5" (1.511 m)      GEN- The patient is overall well appearing, alert and oriented x 3 today.   HEENT: normocephalic, atraumatic; sclera clear, conjunctiva pink; hearing intact; oropharynx clear; neck supple, no JVP Lymph- no cervical lymphadenopathy Lungs- Clear to ausculation bilaterally, normal work of breathing.  No wheezes, rales, rhonchi Heart- Regular rate and rhythm,  no murmurs, rubs or gallops, PMI not laterally displaced GI- soft, non-tender, non-distended, bowel sounds present, no hepatosplenomegaly Extremities- no clubbing or cyanosis. No edema; DP/PT/radial pulses 2+ bilaterally MS- no significant deformity or atrophy Skin- warm and dry, no rash or lesion; ICD pocket well healed Psych- euthymic mood, full affect Neuro- strength and sensation are intact  ICD interrogation- reviewed in detail today,  See PACEART report  EKG:  EKG is not ordered today. Personal review of EKG ordered 07/01/21 shows NSR with PVCs at 68 bpm  Recent Labs: 02/09/2021: ALT 16; TSH 0.765 04/10/2021: Hemoglobin 13.3; Magnesium 2.4; Platelets 202 05/11/2021: B Natriuretic Peptide 836.0; BUN 19; Creatinine, Ser 1.11; Potassium 3.9; Sodium 138   Wt Readings from Last 3 Encounters:  07/19/21 106 lb 9.6 oz (48.4 kg)  07/01/21 107 lb 6.4 oz (48.7 kg)  05/11/21 106 lb 3.2 oz (48.2 kg)     Other studies Reviewed: Additional studies/ records that were reviewed today include: Previous EP and HF notes   Assessment and Plan:  1.  Chronic systolic dysfunction s/p Medtronic single chamber ICD  euvolemic today Stable on an appropriate medical regimen Normal ICD function See Pace Art report No changes today  2. CAD s/p CABG Denies anginal symptoms  3. Chronic hypoxic respiratory failure Wears O2 intermittently  4. PAF 0% burden Off [place] currently due to h/o hemoptysis CHA2DS2/VASc is at least 5.     Brittany [name]'s heart failure has failed to improve despite titration of guideline directed medication such  that he qualifies for the [facility] NEO device. The following information from the patient's medical record supports the medical necessity of this procedure for my patient, consistent with the FDA on-label indication for BAROSTIM NEO:  ? LVEF of <20%  confirmed by Echo on 02/10/21   ? NT-proBNP of <1600 pg/ml  = Labs to be drawn today, 07/17/21  ?Symptomatic despite medication management of: diuretic, beta blocker, and Aldosterone inhibitor as evidenced by symptoms below. Intolerant to [place] with hyperkalemia, and wouldn't re-challenge with relative hypotension.   ? This patients signs and symptoms of heart failure include "dyspnea with mild to moderate exertion, edema, fatigue, dizziness, and pleural effusion   ? NYHA Congestive Heart Failure Classification: III  ? Recent hospitalization for Heart Failure on  04/08/21    This patient is NOT indicated for cardiac resynchronization therapy because QRS = 102 by EKG on 07/01/21    Current medicines are reviewed at length with the patient today.   The patient does not have concerns regarding her medicines.  The following changes were made today:  none  Labs/ tests ordered today include:  Orders Placed This Encounter  Procedures   Pro b natriuretic peptide (BNP)   Comprehensive metabolic panel   CBC    Disposition:   Follow up with EP APP  6 months. Sooner pending barostim work up.    Jacalyn Lefevre, PA-C  07/19/2021 9:34 AM  [facility] [address] [place] [address] [phone] (office) [phone] (fax)

## 2021-07-18 ENCOUNTER — Ambulatory Visit (INDEPENDENT_AMBULATORY_CARE_PROVIDER_SITE_OTHER): Payer: BC Managed Care – PPO

## 2021-07-18 DIAGNOSIS — Z95810 Presence of automatic (implantable) cardiac defibrillator: Secondary | ICD-10-CM

## 2021-07-18 DIAGNOSIS — I5022 Chronic systolic (congestive) heart failure: Secondary | ICD-10-CM

## 2021-07-19 ENCOUNTER — Encounter: Payer: Self-pay | Admitting: Student

## 2021-07-19 ENCOUNTER — Ambulatory Visit (INDEPENDENT_AMBULATORY_CARE_PROVIDER_SITE_OTHER): Payer: BC Managed Care – PPO | Admitting: Student

## 2021-07-19 ENCOUNTER — Other Ambulatory Visit: Payer: Self-pay

## 2021-07-19 VITALS — BP 106/62 | HR 85 | Ht 59.5 in | Wt 106.6 lb

## 2021-07-19 DIAGNOSIS — I255 Ischemic cardiomyopathy: Secondary | ICD-10-CM

## 2021-07-19 DIAGNOSIS — Z951 Presence of aortocoronary bypass graft: Secondary | ICD-10-CM

## 2021-07-19 DIAGNOSIS — I5042 Chronic combined systolic (congestive) and diastolic (congestive) heart failure: Secondary | ICD-10-CM

## 2021-07-19 DIAGNOSIS — I480 Paroxysmal atrial fibrillation: Secondary | ICD-10-CM

## 2021-07-19 DIAGNOSIS — I200 Unstable angina: Secondary | ICD-10-CM

## 2021-07-19 LAB — CUP PACEART INCLINIC DEVICE CHECK
Battery Remaining Longevity: 115 mo
Battery Voltage: 3.02 V
Brady Statistic RV Percent Paced: 0 %
Date Time Interrogation Session: 20220809093351
HighPow Impedance: 69 Ohm
Implantable Lead Implant Date: 20190905
Implantable Lead Location: 753860
Implantable Pulse Generator Implant Date: 20190905
Lead Channel Impedance Value: 380 Ohm
Lead Channel Impedance Value: 494 Ohm
Lead Channel Pacing Threshold Amplitude: 0.75 V
Lead Channel Pacing Threshold Pulse Width: 0.4 ms
Lead Channel Sensing Intrinsic Amplitude: 22.625 mV
Lead Channel Sensing Intrinsic Amplitude: 24.25 mV
Lead Channel Setting Pacing Amplitude: 2 V
Lead Channel Setting Pacing Pulse Width: 0.4 ms
Lead Channel Setting Sensing Sensitivity: 0.3 mV

## 2021-07-19 NOTE — Patient Instructions (Signed)
Medication Instructions:  Your physician recommends that you continue on your current medications as directed. Please refer to the Current Medication list given to you today.  *If you need a refill on your cardiac medications before your next appointment, please call your pharmacy*   Lab Work: TODAY: CMET, CBC, BNP  If you have labs (blood work) drawn today and your tests are completely normal, you will receive your results only by: [place] (if you have MyChart) OR A paper copy in the mail If you have any lab test that is abnormal or we need to change your treatment, we will call you to review the results.   Follow-Up: At [facility], you and your health needs are our priority.  As part of our continuing mission to provide you with exceptional heart care, we have created designated Provider Care Teams.  These Care Teams include your primary Cardiologist (physician) and Advanced Practice Providers (APPs -  Physician Assistants and Nurse Practitioners) who all work together to provide you with the care you need, when you need it.   Your next appointment:   6 month(s)  The format for your next appointment:   In Person  Provider:   Legrand Como "Oda Kilts, PA-C

## 2021-07-19 NOTE — Progress Notes (Signed)
EPIC Encounter for ICM Monitoring  Patient Name: Brittany [name] is a 60 y.o. female Date: 07/19/2021 Primary Care Physican: Maude Leriche, [place] Primary Cardiologist: [place] Electrophysiologist: Allred 07/19/2021 Weight: 104 lbs    Spoke with patient and heart failure questions reviewed.  Pt asymptomatic for fluid accumulation and feeling well.   Optivol Thoracic impedance suggesting normal fluid levels.   Prescribed: Torsemide 20 mg Take 3 tablets (60 mg total) daily. Potassium 20 mEq take 2 tablets by mouth daily.  Take 2 tablets (40 mEq total) with Metolazone Metolazone 2.5 mg take by mouth as needed for fluid  Spironolactone 25 mg take 0.5 tablet by mouth daily Farxiga 10 mg take 1 tablet daily   Labs: 05/11/2021 Creatinine 1.11, BUN 19, Potassium 3.9, Sodium 138, GFR 57 04/11/2021 Creatinine 1.12, BUN 33, Potassium 4.3, Sodium 138, GFR 56 04/10/2021 Creatinine 1.31, BUN 35, Potassium 3.6, Sodium 136, GFR 47 04/09/2021 Creatinine 0.96, BUN 19, Potassium 3.7, Sodium 138, GFR >60 04/08/2021 Creatinine 1.23, BUN 20, Potassium 3.1, Sodium 138, GFR 50  03/14/2021 Creatinine 1.28, BUN 22, Potassium 4.1, Sodium 139, GFR 48 03/01/2021 Creatinine 1.07, BUN 17, Potassium 3.1, Sodium 137, GFR 59  02/12/2021 Creatinine 1.12, BUN 33, Potassium 3.7, Sodium 135, GFR 56 A complete set of results can be found in Results Review.   Recommendations:  No changes and encouraged to call if experiencing any fluid symptoms.   Follow-up plan: ICM clinic phone appointment on 08/22/2021.   91 day device clinic remote transmission 10/04/2021.     EP/Cardiology Office Visits:  11/01/2021 with HF clinic NP/PA.   Copy of ICM check sent to Dr. Rayann Heman.   3 month ICM trend: 07/18/2021.    1 Year ICM trend:       [name], RN 07/19/2021 2:58 PM

## 2021-07-20 ENCOUNTER — Other Ambulatory Visit: Payer: Self-pay | Admitting: Student

## 2021-07-20 ENCOUNTER — Other Ambulatory Visit (HOSPITAL_COMMUNITY): Payer: Self-pay

## 2021-07-20 ENCOUNTER — Telehealth (HOSPITAL_COMMUNITY): Payer: Self-pay | Admitting: Pharmacy Technician

## 2021-07-20 ENCOUNTER — Other Ambulatory Visit: Payer: Self-pay

## 2021-07-20 DIAGNOSIS — I5042 Chronic combined systolic (congestive) and diastolic (congestive) heart failure: Secondary | ICD-10-CM

## 2021-07-20 DIAGNOSIS — Z01818 Encounter for other preprocedural examination: Secondary | ICD-10-CM

## 2021-07-20 LAB — COMPREHENSIVE METABOLIC PANEL
ALT: 12 IU/L (ref 0–32)
AST: 21 IU/L (ref 0–40)
Albumin/Globulin Ratio: 1.9 (ref 1.2–2.2)
Albumin: 4.8 g/dL (ref 3.8–4.9)
Alkaline Phosphatase: 109 IU/L (ref 44–121)
BUN/Creatinine Ratio: 19 (ref 12–28)
BUN: 21 mg/dL (ref 8–27)
Bilirubin Total: 0.9 mg/dL (ref 0.0–1.2)
CO2: 23 mmol/L (ref 20–29)
Calcium: 10.1 mg/dL (ref 8.7–10.3)
Chloride: 97 mmol/L (ref 96–106)
Creatinine, Ser: 1.08 mg/dL — ABNORMAL HIGH (ref 0.57–1.00)
Globulin, Total: 2.5 g/dL (ref 1.5–4.5)
Glucose: 113 mg/dL — ABNORMAL HIGH (ref 65–99)
Potassium: 3.7 mmol/L (ref 3.5–5.2)
Sodium: 142 mmol/L (ref 134–144)
Total Protein: 7.3 g/dL (ref 6.0–8.5)
eGFR: 59 mL/min/{1.73_m2} — ABNORMAL LOW (ref >59)

## 2021-07-20 LAB — CBC
Hematocrit: 53.9 % — ABNORMAL HIGH (ref 34.0–46.6)
Hemoglobin: 17.7 g/dL — ABNORMAL HIGH (ref 11.1–15.9)
MCH: 29.7 pg (ref 26.6–33.0)
MCHC: 32.8 g/dL (ref 31.5–35.7)
MCV: 91 fL (ref 79–97)
Platelets: 169 10*3/uL (ref 150–450)
RBC: 5.95 x10E6/uL — ABNORMAL HIGH (ref 3.77–5.28)
RDW: 17.5 % — ABNORMAL HIGH (ref 11.7–15.4)
WBC: 6.8 10*3/uL (ref 3.4–10.8)

## 2021-07-20 LAB — PRO B NATRIURETIC PEPTIDE: NT-Pro BNP: 3162 pg/mL — ABNORMAL HIGH (ref 0–287)

## 2021-07-20 NOTE — Telephone Encounter (Signed)
Advanced Heart Failure Patient Advocate Encounter  Prior Authorization for [place] has been approved.    PA# [id number] Effective dates: 06/20/21 through 07/20/22  Charlann Boxer, CPhT

## 2021-07-20 NOTE — Progress Notes (Signed)
For Barostim consideration.

## 2021-08-01 NOTE — Progress Notes (Signed)
Remote ICD transmission.   

## 2021-08-16 ENCOUNTER — Other Ambulatory Visit (HOSPITAL_COMMUNITY): Payer: Self-pay | Admitting: Internal Medicine

## 2021-08-22 ENCOUNTER — Telehealth (HOSPITAL_COMMUNITY): Payer: Self-pay | Admitting: Pharmacy Technician

## 2021-08-22 ENCOUNTER — Ambulatory Visit (INDEPENDENT_AMBULATORY_CARE_PROVIDER_SITE_OTHER): Payer: BC Managed Care – PPO

## 2021-08-22 ENCOUNTER — Other Ambulatory Visit (HOSPITAL_COMMUNITY): Payer: Self-pay

## 2021-08-22 DIAGNOSIS — I5042 Chronic combined systolic (congestive) and diastolic (congestive) heart failure: Secondary | ICD-10-CM

## 2021-08-22 DIAGNOSIS — Z95810 Presence of automatic (implantable) cardiac defibrillator: Secondary | ICD-10-CM

## 2021-08-22 NOTE — Telephone Encounter (Signed)
Advanced Heart Failure Patient Advocate Encounter  Prior Authorization for [place] has been submitted and approved via phone. [phone]   PA# [id number]  Effective dates: 07/23/21 through 08/22/22  Charlann Boxer, CPhT

## 2021-08-23 NOTE — Progress Notes (Signed)
EPIC Encounter for ICM Monitoring  Patient Name: Brittany [name] is a 60 y.o. female Date: 08/23/2021 Primary Care Physican: Maude Leriche, [place] Primary Cardiologist: [place] Electrophysiologist: Allred 08/23/2021 Weight: 104 lbs    Spoke with patient and heart failure questions reviewed.  Pt asymptomatic for fluid accumulation and feeling well.   Optivol Thoracic impedance suggesting normal fluid levels.   Prescribed: Torsemide 20 mg Take 3 tablets (60 mg total) daily. Potassium 20 mEq take 2 tablets by mouth daily.  Take 2 tablets (40 mEq total) with Metolazone Metolazone 2.5 mg take by mouth as needed for fluid  Spironolactone 25 mg take 0.5 tablet by mouth daily Farxiga 10 mg take 1 tablet daily   Labs: 07/19/2021 Creatinine 1.08, BUN 21, Potassium 3.7, Sodium 142, GFR 59, NT-Pro BNP 3,162 05/11/2021 Creatinine 1.11, BUN 19, Potassium 3.9, Sodium 138, GFR 57 04/11/2021 Creatinine 1.12, BUN 33, Potassium 4.3, Sodium 138, GFR 56 04/10/2021 Creatinine 1.31, BUN 35, Potassium 3.6, Sodium 136, GFR 47 04/09/2021 Creatinine 0.96, BUN 19, Potassium 3.7, Sodium 138, GFR >60 04/08/2021 Creatinine 1.23, BUN 20, Potassium 3.1, Sodium 138, GFR 50  03/14/2021 Creatinine 1.28, BUN 22, Potassium 4.1, Sodium 139, GFR 48 03/01/2021 Creatinine 1.07, BUN 17, Potassium 3.1, Sodium 137, GFR 59  02/12/2021 Creatinine 1.12, BUN 33, Potassium 3.7, Sodium 135, GFR 56 A complete set of results can be found in Results Review.   Recommendations:  No changes and encouraged to call if experiencing any fluid symptoms.   Follow-up plan: ICM clinic phone appointment on 09/26/2021.   91 day device clinic remote transmission 10/04/2021.     EP/Cardiology Office Visits:  11/01/2021 with HF clinic NP/PA.   Copy of ICM check sent to Dr. Rayann Heman.    3 month ICM trend: 08/23/2021.    1 Year ICM trend:       [name], RN 08/23/2021 3:24 PM

## 2021-08-29 ENCOUNTER — Other Ambulatory Visit: Payer: Self-pay

## 2021-08-29 ENCOUNTER — Ambulatory Visit (HOSPITAL_COMMUNITY)
Admission: RE | Admit: 2021-08-29 | Discharge: 2021-08-29 | Disposition: A | Discharge status: Home / Self Care | Payer: BC Managed Care – PPO | Source: Ambulatory Visit | Attending: Surgery | Admitting: Surgery

## 2021-08-29 ENCOUNTER — Ambulatory Visit (INDEPENDENT_AMBULATORY_CARE_PROVIDER_SITE_OTHER): Payer: BC Managed Care – PPO | Admitting: Surgery

## 2021-08-29 ENCOUNTER — Encounter: Payer: Self-pay | Admitting: Surgery

## 2021-08-29 VITALS — BP 108/71 | HR 75 | Temp 97.9°F | Resp 20 | Ht 59.5 in | Wt 104.0 lb

## 2021-08-29 DIAGNOSIS — I5020 Unspecified systolic (congestive) heart failure: Secondary | ICD-10-CM | POA: Diagnosis not present

## 2021-08-29 DIAGNOSIS — Z01818 Encounter for other preprocedural examination: Secondary | ICD-10-CM | POA: Insufficient documentation

## 2021-08-29 DIAGNOSIS — I200 Unstable angina: Secondary | ICD-10-CM

## 2021-08-29 NOTE — Progress Notes (Signed)
[facility]  Patient name: Brittany [name] MRN: [id number] DOB: Oct 13, 1961 Sex: female   REQUESTING PROVIDER:    Dr. Luiz Ochoa Tillery   REASON FOR CONSULT:    Barostim  HISTORY OF PRESENT ILLNESS:   Brittany [name] is a 60 y.o. female, who is Referred today for evaluation of a Barostim device.  The patient has mild shortness of breath with moderate exertion.  She is able to walk about 2-3 blocks.  She has a single-chamber ICD.  She suffers from type 2 diabetes.  She is medically managed for hypertension.  She is undergone CABG x3 in 2019.  She has a history of small cell lung cancer she takes a statin for hypercholesterolemia.  Her most recent echo shows an ejection fraction less than 20%.  She has atrial fibrillation but is not on anticoagulation  PAST MEDICAL HISTORY    Past Medical History:  Diagnosis Date   Acute systolic congestive heart failure ([place]) 02/03/2018   Allergy    Anxiety    Asthma    DM2 (diabetes mellitus, type 2) ([place]) 10/20/2020   Hypertension    PAF (paroxysmal atrial fibrillation) ([facility])    Prophylactic measure 08/03/14-08/19/14   Prophyl. 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   LIMA to LAD, SVG to D1, SVG to OM1, EVH via right thigh with implantation of Impella LD LVAD via direct aortic approach   Small cell lung cancer ([place]) 03/16/2014     FAMILY HISTORY   Family History  Problem Relation Age of Onset   Heart disease Mother    Hypertension Mother    Heart attack Mother    Hypertension Maternal Grandmother    Cancer Maternal Grandmother    Diabetes Paternal Grandmother    Stroke Neg Hx     SOCIAL HISTORY:   Social History   Socioeconomic History   Marital status: Married    Spouse name: Michell Heinrich    Number of children: 1   Years of education: Not on file   Highest education level: Not on file  Occupational History   Not on  file  Tobacco Use   Smoking status: Former    Packs/day: 1.00    Years: 20.00    Pack years: 20.00    Types: Cigarettes    Quit date: 03/13/2014    Years since quitting: 7.4   Smokeless tobacco: Never  Vaping Use   Vaping Use: Never used  Substance and Sexual Activity   Alcohol use: Yes    Alcohol/week: 8.0 standard drinks    Types: 8 Glasses of wine per week   Drug use: Yes    Types: Marijuana    Comment: "medicinal"   Sexual activity: Never  Other Topics Concern   Not on file  Social History Narrative   Not on file   Social Determinants of Health   Financial Resource Strain: Medium Risk   Difficulty of Paying Living Expenses: Somewhat hard  Food Insecurity: Food Insecurity Present   Worried About Charity fundraiser in the Last Year: Sometimes true   Arboriculturist in the Last Year: Never true  Transportation Needs: Unmet Transportation Needs   Lack of Transportation (Medical): No   Lack of Transportation (Non-Medical): Yes  Physical Activity: Insufficiently Active   Days of Exercise per Week: 2 days   Minutes of Exercise per Session: 10 min  Stress: Stress Concern Present   Feeling of Stress : To  some extent  Social Connections: Unknown   Frequency of Communication with Friends and Family: More than three times a week   Frequency of Social Gatherings with Friends and Family: Twice a week   Attends Religious Services: Not on Electrical engineer or Organizations: Not on file   Attends Archivist Meetings: Never   Marital Status: Married  Human resources officer Violence: Not on file    ALLERGIES:    Allergies  Allergen Reactions   Codeine Nausea And Vomiting    CURRENT MEDICATIONS:    Current Outpatient Medications  Medication Sig Dispense Refill   albuterol (VENTOLIN HFA) 108 (90 Base) MCG/ACT inhaler Inhale 2 puffs into the lungs every 6 (six) hours as needed for wheezing or shortness of breath. 1 Inhaler 0   ALPRAZolam (XANAX) 0.25 MG  tablet Take 1 tablet (0.25 mg total) by mouth daily as needed for anxiety. 10 tablet 0   aspirin EC 81 MG tablet Take 1 tablet (81 mg total) by mouth daily. Swallow whole. 30 tablet 3   atorvastatin (LIPITOR) 80 MG tablet Take 1 tablet (80 mg total) by mouth daily at 6 PM. 30 tablet 1   blood glucose meter kit and supplies KIT Dispense based on patient and insurance preference. Use up to four times daily as directed. 1 each 0   FARXIGA 10 MG TABS tablet TAKE 1 TABLET (10 MG TOTAL) BY MOUTH DAILY BEFORE BREAKFAST. 30 tablet 6   Glucerna (GLUCERNA) LIQD Take 237 mLs by mouth daily in the afternoon.     ipratropium-albuterol (DUONEB) 0.5-2.5 (3) MG/3ML SOLN Take 3 mLs by nebulization 2 (two) times daily as needed (for shortness of breath).     ivabradine (CORLANOR) 7.5 MG TABS tablet Take 1 tablet (7.5 mg total) by mouth 2 (two) times daily with a meal. 60 tablet 5   metolazone (ZAROXOLYN) 2.5 MG tablet Take 2.5 mg by mouth as needed.     nitroGLYCERIN (NITROSTAT) 0.4 MG SL tablet Place 1 tablet (0.4 mg total) under the tongue every 5 (five) minutes as needed for chest pain. 90 tablet 5   potassium chloride SA (KLOR-CON) 20 MEQ tablet Take 20 mEq by mouth daily. With add'l 20 meq with metolazone     spironolactone (ALDACTONE) 25 MG tablet Take 0.5 tablets (12.5 mg total) by mouth daily. 45 tablet 3   torsemide (DEMADEX) 20 MG tablet Take 60 mg by mouth daily.     No current facility-administered medications for this visit.    REVIEW OF SYSTEMS:   [X]  denotes positive finding, [ ]  denotes negative finding Cardiac  Comments:  Chest pain or chest pressure: x   Shortness of breath upon exertion: x   Short of breath when lying flat: x   Irregular heart rhythm: x       Vascular    Pain in calf, thigh, or hip brought on by ambulation:    Pain in feet at night that wakes you up from your sleep:     Blood clot in your veins:    Leg swelling:         Pulmonary    Oxygen at home: x   Productive  cough:  x   Wheezing:  x       Neurologic    Sudden weakness in arms or legs:     Sudden numbness in arms or legs:     Sudden onset of difficulty speaking or slurred speech:    Temporary loss  of vision in one eye:     Problems with dizziness:         Gastrointestinal    Blood in stool:      Vomited blood:         Genitourinary    Burning when urinating:     Blood in urine:        Psychiatric    Major depression:         Hematologic    Bleeding problems:    Problems with blood clotting too easily:        Skin    Rashes or ulcers:        Constitutional    Fever or chills:     PHYSICAL EXAM:   Vitals:   08/29/21 0948  BP: 108/71  Pulse: 75  Resp: 20  Temp: 97.9 F (36.6 C)  SpO2: 96%  Weight: 104 lb (47.2 kg)  Height: 4' 11.5" (1.511 m)    GENERAL: The patient is a well-nourished female, in no acute distress. The vital signs are documented above. CARDIAC: There is a regular rate and rhythm.  PULMONARY: Nonlabored respirations MUSCULOSKELETAL: There are no major deformities or cyanosis. NEUROLOGIC: No focal weakness or paresthesias are detected. SKIN: There are no ulcers or rashes noted. PSYCHIATRIC: The patient has a normal affect.  STUDIES:   I have reviewed the following carotid duplex:  Right Carotid: Velocities in the right ICA are consistent with a 1-39%  stenosis.   Left Carotid: Velocities in the left ICA are consistent with a 1-39%  stenosis.   Vertebrals:  Bilateral vertebral arteries demonstrate antegrade flow.  Subclavians: Normal flow hemodynamics were seen in bilateral subclavian               arteries.   ASSESSMENT and PLAN   NYHA class III heart failure: The patient is referred for Barostim implant.  We discussed the details of the procedure including the risks and benefits.  All of her questions were answered.  She would like to proceed as soon as possible.  I will get her scheduled once we get insurance approval.   Leia Alf, MD, FACS [facility] [phone] Pager [phone]

## 2021-09-05 ENCOUNTER — Telehealth: Payer: Self-pay | Admitting: Internal Medicine

## 2021-09-05 NOTE — Telephone Encounter (Signed)
Caryl Pina from Vascular and Vein states the patient's BMP was too high for their procedure. She requests the patient have another BMP and says it needs to be lower than 1600 for the procedure. Phone: [phone] okay to leave a message, voicemail confidential.

## 2021-09-26 ENCOUNTER — Ambulatory Visit (INDEPENDENT_AMBULATORY_CARE_PROVIDER_SITE_OTHER): Payer: BC Managed Care – PPO

## 2021-09-26 DIAGNOSIS — I5042 Chronic combined systolic (congestive) and diastolic (congestive) heart failure: Secondary | ICD-10-CM

## 2021-09-26 DIAGNOSIS — Z95810 Presence of automatic (implantable) cardiac defibrillator: Secondary | ICD-10-CM

## 2021-09-28 ENCOUNTER — Emergency Department (HOSPITAL_COMMUNITY): Payer: BC Managed Care – PPO

## 2021-09-28 ENCOUNTER — Encounter (HOSPITAL_COMMUNITY): Payer: Self-pay

## 2021-09-28 ENCOUNTER — Inpatient Hospital Stay (HOSPITAL_COMMUNITY)
Admission: EM | Admit: 2021-09-28 | Discharge: 2021-10-01 | DRG: 291 | Disposition: A | Discharge status: Home / Self Care | Payer: BC Managed Care – PPO | Attending: Internal Medicine | Admitting: Internal Medicine

## 2021-09-28 DIAGNOSIS — I255 Ischemic cardiomyopathy: Secondary | ICD-10-CM | POA: Diagnosis present

## 2021-09-28 DIAGNOSIS — I130 Hypertensive heart and chronic kidney disease with heart failure and stage 1 through stage 4 chronic kidney disease, or unspecified chronic kidney disease: Secondary | ICD-10-CM | POA: Diagnosis not present

## 2021-09-28 DIAGNOSIS — I2582 Chronic total occlusion of coronary artery: Secondary | ICD-10-CM | POA: Diagnosis present

## 2021-09-28 DIAGNOSIS — I252 Old myocardial infarction: Secondary | ICD-10-CM

## 2021-09-28 DIAGNOSIS — E785 Hyperlipidemia, unspecified: Secondary | ICD-10-CM | POA: Diagnosis present

## 2021-09-28 DIAGNOSIS — Z833 Family history of diabetes mellitus: Secondary | ICD-10-CM

## 2021-09-28 DIAGNOSIS — E875 Hyperkalemia: Secondary | ICD-10-CM | POA: Diagnosis present

## 2021-09-28 DIAGNOSIS — Z7984 Long term (current) use of oral hypoglycemic drugs: Secondary | ICD-10-CM

## 2021-09-28 DIAGNOSIS — I5043 Acute on chronic combined systolic (congestive) and diastolic (congestive) heart failure: Secondary | ICD-10-CM | POA: Diagnosis present

## 2021-09-28 DIAGNOSIS — N183 Chronic kidney disease, stage 3 unspecified: Secondary | ICD-10-CM | POA: Diagnosis present

## 2021-09-28 DIAGNOSIS — N1832 Chronic kidney disease, stage 3b: Secondary | ICD-10-CM | POA: Diagnosis present

## 2021-09-28 DIAGNOSIS — E1122 Type 2 diabetes mellitus with diabetic chronic kidney disease: Secondary | ICD-10-CM | POA: Diagnosis present

## 2021-09-28 DIAGNOSIS — Z7982 Long term (current) use of aspirin: Secondary | ICD-10-CM

## 2021-09-28 DIAGNOSIS — J441 Chronic obstructive pulmonary disease with (acute) exacerbation: Secondary | ICD-10-CM | POA: Diagnosis present

## 2021-09-28 DIAGNOSIS — E1169 Type 2 diabetes mellitus with other specified complication: Secondary | ICD-10-CM | POA: Diagnosis present

## 2021-09-28 DIAGNOSIS — J439 Emphysema, unspecified: Secondary | ICD-10-CM | POA: Diagnosis present

## 2021-09-28 DIAGNOSIS — Z87891 Personal history of nicotine dependence: Secondary | ICD-10-CM

## 2021-09-28 DIAGNOSIS — R0602 Shortness of breath: Secondary | ICD-10-CM | POA: Diagnosis not present

## 2021-09-28 DIAGNOSIS — I480 Paroxysmal atrial fibrillation: Secondary | ICD-10-CM | POA: Diagnosis present

## 2021-09-28 DIAGNOSIS — J9600 Acute respiratory failure, unspecified whether with hypoxia or hypercapnia: Secondary | ICD-10-CM

## 2021-09-28 DIAGNOSIS — Z8249 Family history of ischemic heart disease and other diseases of the circulatory system: Secondary | ICD-10-CM

## 2021-09-28 DIAGNOSIS — E119 Type 2 diabetes mellitus without complications: Secondary | ICD-10-CM | POA: Diagnosis present

## 2021-09-28 DIAGNOSIS — Z20822 Contact with and (suspected) exposure to covid-19: Secondary | ICD-10-CM | POA: Diagnosis present

## 2021-09-28 DIAGNOSIS — E782 Mixed hyperlipidemia: Secondary | ICD-10-CM | POA: Diagnosis present

## 2021-09-28 DIAGNOSIS — Z85118 Personal history of other malignant neoplasm of bronchus and lung: Secondary | ICD-10-CM

## 2021-09-28 DIAGNOSIS — N179 Acute kidney failure, unspecified: Secondary | ICD-10-CM | POA: Diagnosis present

## 2021-09-28 DIAGNOSIS — Z9221 Personal history of antineoplastic chemotherapy: Secondary | ICD-10-CM

## 2021-09-28 DIAGNOSIS — Z79899 Other long term (current) drug therapy: Secondary | ICD-10-CM

## 2021-09-28 DIAGNOSIS — R7989 Other specified abnormal findings of blood chemistry: Secondary | ICD-10-CM | POA: Diagnosis present

## 2021-09-28 DIAGNOSIS — Z885 Allergy status to narcotic agent status: Secondary | ICD-10-CM

## 2021-09-28 DIAGNOSIS — Z951 Presence of aortocoronary bypass graft: Secondary | ICD-10-CM

## 2021-09-28 DIAGNOSIS — Z95810 Presence of automatic (implantable) cardiac defibrillator: Secondary | ICD-10-CM

## 2021-09-28 DIAGNOSIS — J9620 Acute and chronic respiratory failure, unspecified whether with hypoxia or hypercapnia: Secondary | ICD-10-CM | POA: Diagnosis present

## 2021-09-28 DIAGNOSIS — N1831 Chronic kidney disease, stage 3a: Secondary | ICD-10-CM | POA: Diagnosis present

## 2021-09-28 DIAGNOSIS — I2510 Atherosclerotic heart disease of native coronary artery without angina pectoris: Secondary | ICD-10-CM | POA: Diagnosis present

## 2021-09-28 DIAGNOSIS — Z923 Personal history of irradiation: Secondary | ICD-10-CM

## 2021-09-28 LAB — BASIC METABOLIC PANEL
Anion gap: 10 (ref 5–15)
BUN: 15 mg/dL (ref 8–23)
CO2: 21 mmol/L — ABNORMAL LOW (ref 22–32)
Calcium: 9.8 mg/dL (ref 8.9–10.3)
Chloride: 106 mmol/L (ref 98–111)
Creatinine, Ser: 1.06 mg/dL — ABNORMAL HIGH (ref 0.44–1.00)
GFR, Estimated: 60 mL/min — ABNORMAL LOW (ref >60)
Glucose, Bld: 125 mg/dL — ABNORMAL HIGH (ref 70–99)
Potassium: 4.6 mmol/L (ref 3.5–5.1)
Sodium: 137 mmol/L (ref 135–145)

## 2021-09-28 LAB — CBC
HCT: 51.5 % — ABNORMAL HIGH (ref 36.0–46.0)
Hemoglobin: 16 g/dL — ABNORMAL HIGH (ref 12.0–15.0)
MCH: 29.8 pg (ref 26.0–34.0)
MCHC: 31.1 g/dL (ref 30.0–36.0)
MCV: 95.9 fL (ref 80.0–100.0)
Platelets: 182 10*3/uL (ref 150–400)
RBC: 5.37 MIL/uL — ABNORMAL HIGH (ref 3.87–5.11)
RDW: 20.6 % — ABNORMAL HIGH (ref 11.5–15.5)
WBC: 10 10*3/uL (ref 4.0–10.5)
nRBC: 0 % (ref 0.0–0.2)

## 2021-09-28 LAB — I-STAT VENOUS BLOOD GAS, ED
Acid-base deficit: 3 mmol/L — ABNORMAL HIGH (ref 0.0–2.0)
Bicarbonate: 20.3 mmol/L (ref 20.0–28.0)
Calcium, Ion: 1.15 mmol/L (ref 1.15–1.40)
HCT: 51 % — ABNORMAL HIGH (ref 36.0–46.0)
Hemoglobin: 17.3 g/dL — ABNORMAL HIGH (ref 12.0–15.0)
O2 Saturation: 94 %
Potassium: 4.4 mmol/L (ref 3.5–5.1)
Sodium: 139 mmol/L (ref 135–145)
TCO2: 21 mmol/L — ABNORMAL LOW (ref 22–32)
pCO2, Ven: 31.5 mmHg — ABNORMAL LOW (ref 44.0–60.0)
pH, Ven: 7.417 (ref 7.250–7.430)
pO2, Ven: 70 mmHg — ABNORMAL HIGH (ref 32.0–45.0)

## 2021-09-28 LAB — TROPONIN I (HIGH SENSITIVITY): Troponin I (High Sensitivity): 19 ng/L — ABNORMAL HIGH (ref <18)

## 2021-09-28 MED ORDER — METHYLPREDNISOLONE SODIUM SUCC 125 MG IJ SOLR
125.0000 mg | Freq: Once | INTRAMUSCULAR | Status: AC
  Administered 2021-09-28: 125 mg via INTRAVENOUS
  Filled 2021-09-28: qty 2

## 2021-09-28 MED ORDER — FUROSEMIDE 10 MG/ML IJ SOLN
60.0000 mg | Freq: Once | INTRAMUSCULAR | Status: AC
  Administered 2021-09-28: 60 mg via INTRAVENOUS
  Filled 2021-09-28: qty 6

## 2021-09-28 MED ORDER — IPRATROPIUM-ALBUTEROL 0.5-2.5 (3) MG/3ML IN SOLN
3.0000 mL | RESPIRATORY_TRACT | Status: AC
Start: 2021-09-28 — End: 2021-09-29
  Administered 2021-09-28 – 2021-09-29 (×3): 3 mL via RESPIRATORY_TRACT
  Filled 2021-09-28 (×2): qty 3

## 2021-09-28 NOTE — ED Provider Notes (Signed)
[facility] EMERGENCY DEPARTMENT Provider Note   CSN: [id number] Arrival date & time: 09/28/21  2106     History Chief Complaint  Patient presents with   Chest Pain    Brittany [name] is a 60 y.o. female.   Chest Pain Associated symptoms: cough, nausea and shortness of breath    60 year old female with a history of CHF, COPD, DM 2, HTN, paroxysmal atrial fibrillation, CABG presenting to the emergency department with shortness of breath, nausea, substernal chest discomfort that began yesterday evening.  The patient feels like she has had worsening shortness of breath consistent with prior COPD exacerbations.  She endorses worsening dyspnea.  She has had some difficulty lying flat over the past evening.  She denies any significant weight gain.  She has been taking 60 mg of her home torsemide at home and has been compliant with the medication.  She describes substernal chest discomfort, radiating in a band across her chest wall, described as some pressure.  Past Medical History:  Diagnosis Date   Acute systolic congestive heart failure ([place]) 02/03/2018   Allergy    Anxiety    Asthma    DM2 (diabetes mellitus, type 2) ([place]) 10/20/2020   Hypertension    PAF (paroxysmal atrial fibrillation) ([place])    Prophylactic measure 08/03/14-08/19/14   Prophyl. 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   LIMA to LAD, SVG to D1, SVG to OM1, EVH via right thigh with implantation of Impella LD LVAD via direct aortic approach   Small cell lung cancer ([place]) 03/16/2014    Patient Active Problem List   Diagnosis Date Noted   Acute on chronic systolic (congestive) heart failure ([place]) 63/87/5643   Acute systolic (congestive) heart failure ([place]) 04/09/2021   Unstable angina ([place]) 02/09/2021   COPD with acute exacerbation ([place]) 10/21/2020   Hemoptysis 10/20/2020   DM2 (diabetes mellitus, type 2) ([place]) 10/20/2020   Acute on chronic systolic and diastolic heart  failure, NYHA class 3 ([place]) 05/21/2020   Ischemic cardiomyopathy 04/12/2020   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   NSTEMI (non-ST elevated myocardial infarction) ([place]) 02/12/2019   CHF (congestive heart failure) ([place]) 11/24/2018   Recurrent pleural effusion on right 05/05/2018   Acute respiratory failure ([place]) 05/05/2018   Dyspnea 04/19/2018   Chronic respiratory failure with hypoxia (HCC)    Acute on chronic systolic CHF (congestive heart failure) ([place]) 02/03/2018   S/P CABG (coronary artery bypass graft) 02/03/2018   Asthma    Anxiety    Allergy    HCAP (healthcare-associated pneumonia) 03/15/2015   Chest pain 03/15/2015   Small cell lung cancer ([place]) 03/16/2014   Protein-calorie malnutrition, severe ([place]) 03/14/2014   Atrial fibrillation ([place]) 03/12/2014   HTN (hypertension) 05/07/2012    Past Surgical History:  Procedure Laterality Date   BRONCHIAL BRUSHINGS  10/25/2020   Procedure: BRONCHIAL BRUSHINGS;  Surgeon: Collene Gobble, MD;  Location: [place];  Service: Pulmonary;;   BRONCHIAL NEEDLE ASPIRATION BIOPSY  10/25/2020   Procedure: BRONCHIAL NEEDLE ASPIRATION BIOPSIES;  Surgeon: Collene Gobble, MD;  Location: [facility]OS[facility];  Service: Pulmonary;;   CARDIAC DEFIBRILLATOR PLACEMENT  08/15/2018   MDT Visia AF MRI VR ICD implanted by Dr Loel Lofty for primary prevention of sudden   CESAREAN SECTION     CORONARY ARTERY BYPASS GRAFT N/A 02/03/2018   Procedure: CORONARY ARTERY BYPASS GRAFTING (CABG);  Surgeon: Rexene Alberts, MD;  Location: [place];  Service: Open Heart Surgery;  Laterality:  N/A;  Time 3 using left internal mammary artery and endoscopically harvested right saphenous vein   CORONARY BALLOON ANGIOPLASTY N/A 02/03/2018   Procedure: CORONARY BALLOON ANGIOPLASTY;  Surgeon: Martinique, Peter M, MD;  Location: [place] [facility];  Service: Cardiovascular;  Laterality: N/A;   CORONARY STENT INTERVENTION N/A 02/12/2019   Procedure: CORONARY STENT  INTERVENTION;  Surgeon: Wellington Hampshire, MD;  Location: [name] [facility];  Service: Cardiovascular;  Laterality: N/A;   CORONARY/GRAFT ACUTE MI REVASCULARIZATION N/A 02/03/2018   Procedure: Coronary/Graft Acute MI Revascularization;  Surgeon: Martinique, Peter M, MD;  Location: [place] [facility];  Service: Cardiovascular;  Laterality: N/A;   ENDOBRONCHIAL ULTRASOUND N/A 10/25/2020   Procedure: ENDOBRONCHIAL ULTRASOUND;  Surgeon: Collene Gobble, MD;  Location: [facility] ENDOSCOPY;  Service: Pulmonary;  Laterality: N/A;   FLEXIBLE BRONCHOSCOPY  10/25/2020   Procedure: FLEXIBLE BRONCHOSCOPY;  Surgeon: Collene Gobble, MD;  Location: [facility] ENDOSCOPY;  Service: Pulmonary;;   IABP INSERTION N/A 02/03/2018   Procedure: IABP Insertion;  Surgeon: Martinique, Peter M, MD;  Location: [place] [facility];  Service: Cardiovascular;  Laterality: N/A;   INTRAOPERATIVE TRANSESOPHAGEAL ECHOCARDIOGRAM N/A 02/03/2018   Procedure: INTRAOPERATIVE TRANSESOPHAGEAL ECHOCARDIOGRAM;  Surgeon: Rexene Alberts, MD;  Location: [place];  Service: Open Heart Surgery;  Laterality: N/A;   LEFT HEART CATH AND CORONARY ANGIOGRAPHY N/A 02/03/2018   Procedure: LEFT HEART CATH AND CORONARY ANGIOGRAPHY;  Surgeon: Martinique, Peter M, MD;  Location: [place] [facility];  Service: Cardiovascular;  Laterality: N/A;   LEFT HEART CATH AND CORS/GRAFTS ANGIOGRAPHY N/A 02/12/2019   Procedure: LEFT HEART CATH AND CORS/GRAFTS ANGIOGRAPHY;  Surgeon: Wellington Hampshire, MD;  Location: [place] [facility];  Service: Cardiovascular;  Laterality: N/A;   MEDIASTINOSCOPY N/A 03/11/2014   Procedure: MEDIASTINOSCOPY;  Surgeon: Melrose Nakayama, MD;  Location: [place];  Service: Thoracic;  Laterality: N/A;   PLACEMENT OF IMPELLA LEFT VENTRICULAR ASSIST DEVICE  02/03/2018   Procedure: PLACEMENT OF [place] LEFT VENTRICULAR ASSIST DEVICE LD;  Surgeon: Rexene Alberts, MD;  Location: [place];  Service: Open Heart Surgery;;   REMOVAL OF [place] LEFT VENTRICULAR ASSIST DEVICE N/A 02/08/2018    Procedure: REMOVAL OF [place] LEFT VENTRICULAR ASSIST DEVICE;  Surgeon: Rexene Alberts, MD;  Location: [place];  Service: Open Heart Surgery;  Laterality: N/A;   RIGHT HEART CATH N/A 02/03/2018   Procedure: RIGHT HEART CATH;  Surgeon: Martinique, Peter M, MD;  Location: [place] [facility];  Service: Cardiovascular;  Laterality: N/A;   RIGHT HEART CATH N/A 05/09/2018   Procedure: RIGHT HEART CATH;  Surgeon: Jolaine Artist, MD;  Location: [place] [facility];  Service: Cardiovascular;  Laterality: N/A;   TEE WITHOUT CARDIOVERSION N/A 02/08/2018   Procedure: TRANSESOPHAGEAL ECHOCARDIOGRAM (TEE);  Surgeon: Rexene Alberts, MD;  Location: [place];  Service: Open Heart Surgery;  Laterality: N/A;   TUBAL LIGATION     VIDEO BRONCHOSCOPY WITH ENDOBRONCHIAL ULTRASOUND N/A 03/11/2014   Procedure: VIDEO BRONCHOSCOPY WITH ENDOBRONCHIAL ULTRASOUND;  Surgeon: Melrose Nakayama, MD;  Location: [place];  Service: Thoracic;  Laterality: N/A;     OB History   No obstetric history on file.     Family History  Problem Relation Age of Onset   Heart disease Mother    Hypertension Mother    Heart attack Mother    Hypertension Maternal Grandmother    Cancer Maternal Grandmother    Diabetes Paternal Grandmother    Stroke Neg Hx     Social  History   Tobacco Use   Smoking status: Former    Packs/day: 1.00    Years: 20.00    Pack years: 20.00    Types: Cigarettes    Quit date: 03/13/2014    Years since quitting: 7.5   Smokeless tobacco: Never  Vaping Use   Vaping Use: Never used  Substance Use Topics   Alcohol use: Yes    Alcohol/week: 8.0 standard drinks    Types: 8 Glasses of wine per week   Drug use: Yes    Types: Marijuana    Comment: "medicinal"    Home Medications Prior to Admission medications   Medication Sig Start Date End Date Taking? Authorizing Provider  albuterol (VENTOLIN HFA) 108 (90 Base) MCG/ACT inhaler Inhale 2 puffs into the lungs every 6 (six) hours as needed for wheezing or  shortness of breath. 04/16/19   Bensimhon, Shaune Pascal, MD  ALPRAZolam Duanne Moron) 0.25 MG tablet Take 1 tablet (0.25 mg total) by mouth daily as needed for anxiety. 02/24/19   Shirley Friar, PA-C  aspirin EC 81 MG tablet Take 1 tablet (81 mg total) by mouth daily. Swallow whole. 11/10/20   Bensimhon, Shaune Pascal, MD  atorvastatin (LIPITOR) 80 MG tablet Take 1 tablet (80 mg total) by mouth daily at 6 PM. 07/03/18   Bensimhon, Shaune Pascal, MD  blood glucose meter kit and supplies KIT Dispense based on patient and insurance preference. Use up to four times daily as directed. 04/11/21   Bonnielee Haff, MD  FARXIGA 10 MG TABS tablet TAKE 1 TABLET (10 MG TOTAL) BY MOUTH DAILY BEFORE BREAKFAST. 08/16/21   Bensimhon, Shaune Pascal, MD  Glucerna (GLUCERNA) LIQD Take 237 mLs by mouth daily in the afternoon.    [provider]  ipratropium-albuterol (DUONEB) 0.5-2.5 (3) MG/3ML SOLN Take 3 mLs by nebulization 2 (two) times daily as needed (for shortness of breath).    [provider]  ivabradine (CORLANOR) 7.5 MG TABS tablet Take 1 tablet (7.5 mg total) by mouth 2 (two) times daily with a meal. 11/19/20   Bensimhon, Shaune Pascal, MD  metolazone (ZAROXOLYN) 2.5 MG tablet Take 2.5 mg by mouth as needed.    [provider]  nitroGLYCERIN (NITROSTAT) 0.4 MG SL tablet Place 1 tablet (0.4 mg total) under the tongue every 5 (five) minutes as needed for chest pain. 02/14/19   Shirley Friar, PA-C  potassium chloride SA (KLOR-CON) 20 MEQ tablet Take 20 mEq by mouth daily. With add'l 20 meq with metolazone    [provider]  spironolactone (ALDACTONE) 25 MG tablet Take 0.5 tablets (12.5 mg total) by mouth daily. 03/01/21   Clegg, Amy D, NP  torsemide (DEMADEX) 20 MG tablet Take 60 mg by mouth daily.    [provider]    Allergies    Codeine  Review of Systems   Review of Systems  Respiratory:  Positive for cough, shortness of breath and wheezing.   Cardiovascular:  Positive for  chest pain. Negative for leg swelling.  Gastrointestinal:  Positive for diarrhea and nausea.  All other systems reviewed and are negative.  Physical Exam Updated Vital Signs BP 135/74   Pulse 85   Temp 98.8 F (37.1 C) (Oral)   Resp 20   SpO2 99%   Physical Exam Vitals and nursing note reviewed.  Constitutional:      General: She is not in acute distress.    Appearance: She is well-developed.  HENT:     Head: Normocephalic and atraumatic.  Eyes:     Conjunctiva/sclera: Conjunctivae normal.     Pupils: Pupils are equal, round, and reactive to light.  Cardiovascular:     Rate and Rhythm: Normal rate and regular rhythm.     Heart sounds: No murmur heard. Pulmonary:     Effort: Tachypnea present. No respiratory distress.     Breath sounds: Examination of the right-upper field reveals wheezing. Examination of the left-upper field reveals wheezing. Examination of the right-middle field reveals wheezing. Examination of the left-middle field reveals wheezing. Examination of the right-lower field reveals wheezing. Examination of the left-lower field reveals wheezing. Wheezing present.  Abdominal:     General: There is no distension.     Palpations: Abdomen is soft.     Tenderness: There is no abdominal tenderness. There is no guarding.  Musculoskeletal:        General: No deformity or signs of injury.     Cervical back: Normal range of motion and neck supple.     Right lower leg: No edema.     Left lower leg: No edema.  Skin:    General: Skin is warm and dry.     Findings: No lesion or rash.  Neurological:     General: No focal deficit present.     Mental Status: She is alert. Mental status is at baseline.    ED Results / Procedures / Treatments   Labs (all labs ordered are listed, but only abnormal results are displayed) Labs Reviewed  BASIC METABOLIC PANEL - Abnormal; Notable for the following components:      Result Value   CO2 21 (*)    Glucose, Bld 125 (*)     Creatinine, Ser 1.06 (*)    GFR, Estimated 60 (*)    All other components within normal limits  CBC - Abnormal; Notable for the following components:   RBC 5.37 (*)    Hemoglobin 16.0 (*)    HCT 51.5 (*)    RDW 20.6 (*)    All other components within normal limits  I-STAT VENOUS BLOOD GAS, ED - Abnormal; Notable for the following components:   pCO2, Ven 31.5 (*)    pO2, Ven 70.0 (*)    TCO2 21 (*)    Acid-base deficit 3.0 (*)    HCT 51.0 (*)    Hemoglobin 17.3 (*)    All other components within normal limits  TROPONIN I (HIGH SENSITIVITY) - Abnormal; Notable for the following components:   Troponin I (High Sensitivity) 19 (*)    All other components within normal limits  RESP PANEL BY RT-PCR (FLU A&B, COVID) ARPGX2  BRAIN NATRIURETIC PEPTIDE  TROPONIN I (HIGH SENSITIVITY)    EKG EKG Interpretation  Date/Time:  Wednesday September 28 2021 21:15:41 EDT Ventricular Rate:  83 PR Interval:  158 QRS Duration: 102 QT Interval:  431 QTC Calculation: 507 R Axis:   253 Text Interpretation: Sinus rhythm Biatrial enlargement Left anterior fascicular block Consider left ventricular hypertrophy Abnrm T, consider ischemia, anterolateral lds ST elevation, consider inferior injury Prolonged QT interval No significant change since last tracing Confirmed by Regan Lemming ([id number]) on 09/28/2021 9:23:17 PM  Radiology DG Chest 2 View  Result Date: 09/28/2021 CLINICAL DATA:  Chest pain EXAM: CHEST - 2 VIEW COMPARISON:  April 08, 2021 FINDINGS: Single lead left chest AICD/pacemaker. Prior median sternotomy and CABG. Similar enlargement of the cardiac silhouette. Chronic bronchitic lung changes and pulmonary hyperinflation. Chronic right greater than left basilar scarring versus atelectasis. Stable small right pleural  effusion. No overt pulmonary edema. IMPRESSION: 1. COPD with mild stable right greater than left basilar scarring versus atelectasis. 2. Stable small right pleural effusion. Electronically  Signed   By: Dahlia Bailiff M.D.   On: 09/28/2021 21:47   CT Chest Wo Contrast  Result Date: 09/28/2021 CLINICAL DATA:  Shortness of breath, chest pain EXAM: CT CHEST WITHOUT CONTRAST TECHNIQUE: Multidetector CT imaging of the chest was performed following the standard protocol without IV contrast. COMPARISON:  Chest radiographs dated 09/28/2021. CTA chest dated 10/20/2020. FINDINGS: Cardiovascular: Cardiomegaly. No pericardial effusion. Left subclavian ICD. No evidence of thoracic aortic aneurysm. Atherosclerotic calcifications of the arch. Three vessel coronary atherosclerosis. Mediastinum/Nodes: No suspicious mediastinal lymphadenopathy. Visualized thyroid is unremarkable. Lungs/Pleura: Radiation changes in the right perihilar region, unchanged. Mild centrilobular and paraseptal emphysematous changes, upper lung predominant. Evaluation lung parenchyma is constrained by respiratory motion. Within that constraint, there are no suspicious pulmonary nodules. No focal consolidation. No pleural effusion or pneumothorax. Upper Abdomen: Visualized upper abdomen is notable for motion degradation and mild vascular calcifications. Musculoskeletal: Median sternotomy. IMPRESSION: No evidence of acute cardiopulmonary disease. Radiation changes in the right perihilar region. Aortic Atherosclerosis (ICD10-I70.0) and Emphysema (ICD10-J43.9). Electronically Signed   By: Julian Hy M.D.   On: 09/28/2021 23:50    Procedures Procedures   Medications Ordered in ED Medications  ipratropium-albuterol (DUONEB) 0.5-2.5 (3) MG/3ML nebulizer solution 3 mL (3 mLs Nebulization Given 09/28/21 2348)  methylPREDNISolone sodium succinate (SOLU-MEDROL) 125 mg/2 mL injection 125 mg (125 mg Intravenous Given 09/28/21 2348)  furosemide (LASIX) injection 60 mg (60 mg Intravenous Given 09/28/21 2349)    ED Course  I have reviewed the triage vital signs and the nursing notes.  Pertinent labs & imaging results that were  available during my care of the patient were reviewed by me and considered in my medical decision making (see chart for details).    MDM Rules/Calculators/A&P                           60 year old female with a history of CHF, COPD, DM 2, HTN, paroxysmal atrial fibrillation, CABG presenting to the emergency department with shortness of breath, nausea, substernal chest discomfort that began yesterday evening.  The patient feels like she has had worsening shortness of breath consistent with prior COPD exacerbations.  She endorses worsening dyspnea.  She has had some difficulty lying flat over the past evening.  She denies any significant weight gain.  She has been taking 60 mg of her home torsemide at home and has been compliant with the medication.  She describes substernal chest discomfort, radiating in a band across her chest wall, described as some pressure.  On arrival, the patient was vitally stable.  Diffuse wheezing throughout all lung fields with persistent dyspnea present.  Concern for COPD exacerbation.  Additional differential considerations include ACS, pleural effusion, worsening CHF, pericarditis/pericardial effusion.   Initial EKG revealed non-specific ST segment changes, chest x-ray revealed hyperinflation consistent with COPD, small right-sided pleural effusion present.  Initial troponin elevated at 19.  WBC without a leukocytosis.  Patient was administered IV Solu-Medrol, duo nebs x3.  Plan at time of signout to follow-up remaining labs and imaging and reassess the patient after the above interventions.  Signout given to Dr. Christy Gentles at 2330.  Final Clinical Impression(s) / ED Diagnoses Final diagnoses:  None    Rx / DC Orders ED Discharge Orders     None  Regan Lemming, MD 09/28/21 2356

## 2021-09-28 NOTE — ED Provider Notes (Signed)
Plan at signout, f/u on labs and reassess after nebs   Ripley Fraise, MD 09/28/21 2336

## 2021-09-28 NOTE — ED Triage Notes (Signed)
Pt comes via [facility] from home, began to have SOB, nausea, CP and diarrhea yesterday evening, hx of COPD and MI.

## 2021-09-28 NOTE — ED Notes (Signed)
Patient transported to X-ray 

## 2021-09-29 ENCOUNTER — Encounter (HOSPITAL_COMMUNITY): Payer: Self-pay | Admitting: Internal Medicine

## 2021-09-29 ENCOUNTER — Other Ambulatory Visit: Payer: Self-pay | Admitting: Internal Medicine

## 2021-09-29 DIAGNOSIS — N1832 Chronic kidney disease, stage 3b: Secondary | ICD-10-CM | POA: Diagnosis present

## 2021-09-29 DIAGNOSIS — I480 Paroxysmal atrial fibrillation: Secondary | ICD-10-CM | POA: Diagnosis not present

## 2021-09-29 DIAGNOSIS — I5043 Acute on chronic combined systolic (congestive) and diastolic (congestive) heart failure: Secondary | ICD-10-CM | POA: Diagnosis not present

## 2021-09-29 DIAGNOSIS — I5023 Acute on chronic systolic (congestive) heart failure: Secondary | ICD-10-CM

## 2021-09-29 DIAGNOSIS — E782 Mixed hyperlipidemia: Secondary | ICD-10-CM

## 2021-09-29 DIAGNOSIS — N183 Chronic kidney disease, stage 3 unspecified: Secondary | ICD-10-CM | POA: Diagnosis present

## 2021-09-29 DIAGNOSIS — J441 Chronic obstructive pulmonary disease with (acute) exacerbation: Secondary | ICD-10-CM | POA: Diagnosis not present

## 2021-09-29 DIAGNOSIS — E1122 Type 2 diabetes mellitus with diabetic chronic kidney disease: Secondary | ICD-10-CM

## 2021-09-29 DIAGNOSIS — E1169 Type 2 diabetes mellitus with other specified complication: Secondary | ICD-10-CM | POA: Diagnosis present

## 2021-09-29 DIAGNOSIS — N1831 Chronic kidney disease, stage 3a: Secondary | ICD-10-CM | POA: Diagnosis not present

## 2021-09-29 LAB — CBG MONITORING, ED
Glucose-Capillary: 255 mg/dL — ABNORMAL HIGH (ref 70–99)
Glucose-Capillary: 212 mg/dL — ABNORMAL HIGH (ref 70–99)
Glucose-Capillary: 166 mg/dL — ABNORMAL HIGH (ref 70–99)

## 2021-09-29 LAB — TROPONIN I (HIGH SENSITIVITY): Troponin I (High Sensitivity): 24 ng/L — ABNORMAL HIGH (ref <18)

## 2021-09-29 LAB — RESP PANEL BY RT-PCR (FLU A&B, COVID) ARPGX2
Influenza A by PCR: NEGATIVE
Influenza B by PCR: NEGATIVE
SARS Coronavirus 2 by RT PCR: NEGATIVE

## 2021-09-29 LAB — HEMOGLOBIN A1C
Hgb A1c MFr Bld: 5.7 % — ABNORMAL HIGH (ref 4.8–5.6)
Mean Plasma Glucose: 116.89 mg/dL

## 2021-09-29 LAB — BRAIN NATRIURETIC PEPTIDE: B Natriuretic Peptide: 1864 pg/mL — ABNORMAL HIGH (ref 0.0–100.0)

## 2021-09-29 LAB — GLUCOSE, CAPILLARY: Glucose-Capillary: 217 mg/dL — ABNORMAL HIGH (ref 70–99)

## 2021-09-29 MED ORDER — ASPIRIN EC 81 MG PO TBEC
81.0000 mg | DELAYED_RELEASE_TABLET | Freq: Every day | ORAL | Status: DC
  Administered 2021-09-29 – 2021-10-01 (×3): 81 mg via ORAL
  Filled 2021-09-29 (×3): qty 1

## 2021-09-29 MED ORDER — DAPAGLIFLOZIN PROPANEDIOL 10 MG PO TABS
10.0000 mg | ORAL_TABLET | Freq: Every day | ORAL | Status: DC
  Administered 2021-09-29 – 2021-10-01 (×3): 10 mg via ORAL
  Filled 2021-09-29 (×3): qty 1

## 2021-09-29 MED ORDER — FUROSEMIDE 10 MG/ML IJ SOLN
80.0000 mg | Freq: Two times a day (BID) | INTRAMUSCULAR | Status: DC
  Administered 2021-09-29: 80 mg via INTRAVENOUS
  Filled 2021-09-29: qty 8

## 2021-09-29 MED ORDER — IPRATROPIUM-ALBUTEROL 0.5-2.5 (3) MG/3ML IN SOLN
3.0000 mL | Freq: Three times a day (TID) | RESPIRATORY_TRACT | Status: DC
  Administered 2021-09-30 (×2): 3 mL via RESPIRATORY_TRACT
  Filled 2021-09-29 (×3): qty 3

## 2021-09-29 MED ORDER — ACETAMINOPHEN 650 MG RE SUPP: 650.0000 mg | Freq: Four times a day (QID) | RECTAL | Status: DC | PRN

## 2021-09-29 MED ORDER — ALBUTEROL SULFATE (2.5 MG/3ML) 0.083% IN NEBU
10.0000 mg | INHALATION_SOLUTION | Freq: Once | RESPIRATORY_TRACT | Status: AC
  Administered 2021-09-29: 10 mg via RESPIRATORY_TRACT

## 2021-09-29 MED ORDER — IPRATROPIUM-ALBUTEROL 0.5-2.5 (3) MG/3ML IN SOLN
3.0000 mL | Freq: Four times a day (QID) | RESPIRATORY_TRACT | Status: DC
  Administered 2021-09-29 (×3): 3 mL via RESPIRATORY_TRACT
  Filled 2021-09-29 (×3): qty 3

## 2021-09-29 MED ORDER — IVABRADINE HCL 7.5 MG PO TABS
7.5000 mg | ORAL_TABLET | Freq: Two times a day (BID) | ORAL | Status: DC
  Administered 2021-09-29 – 2021-10-01 (×5): 7.5 mg via ORAL
  Filled 2021-09-29 (×7): qty 1

## 2021-09-29 MED ORDER — ACETAMINOPHEN 325 MG PO TABS: 650.0000 mg | ORAL_TABLET | Freq: Four times a day (QID) | ORAL | Status: DC | PRN

## 2021-09-29 MED ORDER — MELATONIN 5 MG PO TABS: 10.0000 mg | ORAL_TABLET | Freq: Every evening | ORAL | Status: DC | PRN

## 2021-09-29 MED ORDER — ONDANSETRON HCL 4 MG PO TABS: 4.0000 mg | ORAL_TABLET | Freq: Four times a day (QID) | ORAL | Status: DC | PRN

## 2021-09-29 MED ORDER — ALBUTEROL SULFATE (2.5 MG/3ML) 0.083% IN NEBU
INHALATION_SOLUTION | RESPIRATORY_TRACT | Status: AC
  Filled 2021-09-29: qty 12

## 2021-09-29 MED ORDER — METOLAZONE 2.5 MG PO TABS
2.5000 mg | ORAL_TABLET | Freq: Every day | ORAL | Status: DC
  Administered 2021-09-29: 2.5 mg via ORAL
  Filled 2021-09-29: qty 1

## 2021-09-29 MED ORDER — ATORVASTATIN CALCIUM 80 MG PO TABS
80.0000 mg | ORAL_TABLET | Freq: Every day | ORAL | Status: DC
  Administered 2021-09-29 – 2021-09-30 (×2): 80 mg via ORAL
  Filled 2021-09-29 (×2): qty 1

## 2021-09-29 MED ORDER — CYCLOBENZAPRINE HCL 10 MG PO TABS
5.0000 mg | ORAL_TABLET | Freq: Three times a day (TID) | ORAL | Status: DC | PRN
  Administered 2021-09-29 – 2021-09-30 (×2): 5 mg via ORAL
  Filled 2021-09-29 (×2): qty 1

## 2021-09-29 MED ORDER — SPIRONOLACTONE 12.5 MG HALF TABLET
12.5000 mg | ORAL_TABLET | Freq: Every day | ORAL | Status: DC
  Administered 2021-09-29 – 2021-10-01 (×3): 12.5 mg via ORAL
  Filled 2021-09-29 (×3): qty 1

## 2021-09-29 MED ORDER — METHYLPREDNISOLONE SODIUM SUCC 125 MG IJ SOLR
120.0000 mg | Freq: Every day | INTRAMUSCULAR | Status: DC
  Filled 2021-09-29: qty 2

## 2021-09-29 MED ORDER — IPRATROPIUM-ALBUTEROL 0.5-2.5 (3) MG/3ML IN SOLN
3.0000 mL | RESPIRATORY_TRACT | Status: DC | PRN
  Administered 2021-09-30: 3 mL via RESPIRATORY_TRACT

## 2021-09-29 MED ORDER — INSULIN ASPART 100 UNIT/ML IJ SOLN
0.0000 [IU] | Freq: Three times a day (TID) | INTRAMUSCULAR | Status: DC
  Administered 2021-09-29: 5 [IU] via SUBCUTANEOUS
  Administered 2021-09-29: 3 [IU] via SUBCUTANEOUS
  Administered 2021-09-29: 5 [IU] via SUBCUTANEOUS
  Administered 2021-09-29: 8 [IU] via SUBCUTANEOUS
  Administered 2021-09-30: 2 [IU] via SUBCUTANEOUS
  Administered 2021-09-30: 3 [IU] via SUBCUTANEOUS
  Administered 2021-09-30: 5 [IU] via SUBCUTANEOUS
  Administered 2021-09-30: 3 [IU] via SUBCUTANEOUS

## 2021-09-29 MED ORDER — ONDANSETRON HCL 4 MG/2ML IJ SOLN: 4.0000 mg | Freq: Four times a day (QID) | INTRAMUSCULAR | Status: DC | PRN

## 2021-09-29 MED ORDER — NITROGLYCERIN 0.4 MG SL SUBL: 0.4000 mg | SUBLINGUAL_TABLET | SUBLINGUAL | Status: DC | PRN

## 2021-09-29 MED ORDER — ALBUTEROL SULFATE (2.5 MG/3ML) 0.083% IN NEBU: 10.0000 mg/h | INHALATION_SOLUTION | Freq: Once | RESPIRATORY_TRACT | Status: DC

## 2021-09-29 MED ORDER — SENNOSIDES-DOCUSATE SODIUM 8.6-50 MG PO TABS: 1.0000 | ORAL_TABLET | Freq: Every evening | ORAL | Status: DC | PRN

## 2021-09-29 MED ORDER — ENOXAPARIN SODIUM 40 MG/0.4ML IJ SOSY
40.0000 mg | PREFILLED_SYRINGE | Freq: Every day | INTRAMUSCULAR | Status: DC
  Administered 2021-09-29 – 2021-10-01 (×3): 40 mg via SUBCUTANEOUS
  Filled 2021-09-29 (×3): qty 0.4

## 2021-09-29 MED ORDER — METHYLPREDNISOLONE SODIUM SUCC 40 MG IJ SOLR
40.0000 mg | Freq: Every day | INTRAMUSCULAR | Status: DC
  Administered 2021-09-29 – 2021-10-01 (×3): 40 mg via INTRAVENOUS
  Filled 2021-09-29 (×2): qty 1

## 2021-09-29 MED ORDER — ALPRAZOLAM 0.25 MG PO TABS: 0.2500 mg | ORAL_TABLET | Freq: Every day | ORAL | Status: DC | PRN

## 2021-09-29 NOTE — Progress Notes (Signed)
Patient brought to 4E from ED. VSS. CHG bath completed. Telemetry box applied, CCMD notified. Patient oriented to room and staff. Call bell in reach.  Daymon Larsen, RN

## 2021-09-29 NOTE — Consult Note (Addendum)
Advanced Heart Failure Team Consult Note   Primary Physician: Scifres, [name], PA-C PCP-Cardiologist:  Dr. [name]   Reason for Consultation: Acute on Chronic Systolic Heart Failure   HPI:    Brittany [name] is seen today for evaluation of acute on chronic systolic heart failure at the request of Dr. Pietro Cassis, Internal Medicine.   Brittany [name] is a 61 y.o. female with a history of PAF,  previous smoker quit in 2015, hypertension, previous small cell lung cancer treated with chemo, chest XRT and prophylactic brain radiation in 2015, CAD s/p CABG and chronic systolic HF EF ~32%.   Admitted 02/03/2018 with NSTEMI and shock. Underwent emergent cath 02/03/18 showed LAD 100% stenosed, LCx 95% stenosed. Taken for emergent CABG 02/03/18. Required impella post op. Hospital course complicated by cardiogenic shock, HCAP, A fib, respiratory failure, and swallowing issues. She was discharged to SNF. Discharge weight 103 pounds.    In 2019 had multiple hospitalizations for HF and pleural effusion. Underwent pleurodesis at [facility].   Admitted 3/20 with NSTEMI and HF. Received DES to ostial ramus into distal left main based on cath below and underwent diuresis. Meds adjusted as tolerated. Echo with EF 25-30%.    Admitted 10/2020 with COPD flare with mild HF and hemoptysis. CTA showed stable scarring in the right hilum consistent with emphysema.. Underwent bronchoscopy on 11/15, no obvious source of bleeding found on examination.    Echo 11/21 EF 20-25%. RV mildly HK.   Admitted 02/09/21 with chest pain and shortness of breath. HS-Trop with no trend. Echo with EF < 20%. Treated for AECOPD exacerbation. Breathing improved. Discharged on torsemide 60 mg daily.   Admitted 04/08/21 with ADHF. Diuresed with IV lasix and transitioned back to torsemide 80 mg daily x 4 days then down to torsemide 60 mg daily.    She has struggled recently w/ NYHA III-IIIb symptoms but not a candidate for advanced  therapies/ transplant due to advanced lung disease. GDMT limited by low output and hyperkalemia. She has been referred to Dr. Trula Slade and is awaiting insurance approval for Barostim implant.   She now presents to the ED w/ worsening dyspnea and chest pain. In respiratory distress on arrival, requiring BiPAP. CT imaging of the chest revealed no obvious evidence of acute cardiopulmonary disease. CXR c/w COPD and stable small rt pleural effusion. No overt edema. BNP elevated at 1,864. COVID negative. HS trop 19>>24. EKG NSR 83, BAE no acute ST abnormalities. WBC 10. AF. CP described and chest tightness and associated w/ wheezing. Improves w/ breathing treatments.   Being admitted for ACOPDE + A/c CHF. Getting solu-medrol and nebs. We have been consulted for CHF management.   SCr stabke at 1.06. K 4.4   She is starting to diurese w/ IV Lasix. Breathing improved, now off BiPAP and on Linganore. CP improved. She reports that she has been out of [place] ~2 weeks due to difficulties obtaining from pharmacy, noting refills were not approved. Compliant w/ all other meds.    Cardidac Studies: ECHO 2022: EF < 20% RV normal  ECHO 2021: EF 20-25% RV mildly reduced.  ECHO 02/12/19: EF 25-30%, RV mildly reduced ECHO 02/10/21: EF <20%, G2DD   05/18/2021 PFT's: moderately severe airway obstruction and a diffusion defect suggesting emphysema.   LHC 02/12/19: Ost LAD to Prox LAD lesion is 100% stenosed. Ost Cx to Prox Cx lesion is 100% stenosed. Ost Ramus lesion is 99% stenosed. Post intervention, there is a 0% residual stenosis. A drug-eluting stent was successfully placed  using a Catherine [id number]. SVG and is normal in caliber. The graft exhibits no disease. Ost 1st Diag lesion is 70% stenosed. SVG and is normal in caliber. Prox Graft lesion is 100% stenosed. 1.  Significant underlying two-vessel coronary artery disease with patent SVG to OM and SVG to diagonal which supplies the LAD territory.  Atretic LIMA  to LAD given competitive flow from SVG to diagonal.  Severe ostial ramus artery stenosis not supplied by bypass with his area suggestive of plaque rupture. 2.  Severely elevated left ventricular end-diastolic pressure 34 mmHg.  Left ventricular angiography was not performed. 3.  Successful angioplasty and drug-eluting stent placement to the ostial ramus extending into the distal left main.   [place] 2019  RA = 8 RV = 49/9 PA = 47/20 (32) PCW = 19 Fick cardiac output/index = 4.3/3.1 PVR = 3.0 wu AO sat = 93% PA sat = 62%, 64%  Assessment: 1. Minimally elevated left heart pressures 2. Mild PAH 3. Normal cardiac output  Review of Systems: [y] = yes, _0  = no   General: Weight gain _1 ; Weight loss _2 ; Anorexia _3 ; Fatigue _4 ; Fever _5 ; Chills _6 ; Weakness _7   Cardiac: Chest pain/pressure [ Y]; Resting SOB [ Y]; Exertional SOB [ Y]; Orthopnea [ Y]; Pedal Edema _8 ; Palpitations _9 ; Syncope _10 ; Presyncope _11 ; Paroxysmal nocturnal dyspnea_12   Pulmonary: Cough _13 ; Wheezing[ Y]; Hemoptysis_14 ; Sputum _15 ; Snoring _16   GI: Vomiting_17 ; Dysphagia_18 ; Melena_19 ; Hematochezia _20 ; Heartburn_21 ; Abdominal pain _22 ; Constipation _23 ; Diarrhea _24 ; BRBPR _25   GU: Hematuria_26 ; Dysuria _27 ; Nocturia_28   Vascular: Pain in legs with walking _29 ; Pain in feet with lying flat _30 ; Non-healing sores _31 ; Stroke _32 ; TIA _33 ; Slurred speech _34 ;  Neuro: Headaches_35 ; Vertigo_36 ; Seizures_37 ; Paresthesias_38 ;Blurred vision _39 ; Diplopia _40 ; Vision changes _41   Ortho/Skin: Arthritis _42 ; Joint pain _43 ; Muscle pain _44 ; Joint swelling _45 ; Back Pain _46 ; Rash _47   Psych: Depression_48 ; Anxiety_49   Heme: Bleeding problems _50 ; Clotting disorders _51 ; Anemia _52   Endocrine: Diabetes [ Y]; Thyroid dysfunction_53   Home Medications Prior to Admission medications   Medication Sig Start Date End Date Taking? Authorizing Provider  albuterol (VENTOLIN HFA) 108 (90 Base) MCG/ACT inhaler Inhale 2 puffs into  the lungs every 6 (six) hours as needed for wheezing or shortness of breath. 04/16/19  Yes Bensimhon, Shaune Pascal, MD  ALPRAZolam Duanne Moron) 0.25 MG tablet Take 1 tablet (0.25 mg total) by mouth daily as needed for anxiety. 02/24/19  Yes Shirley Friar, PA-C  aspirin EC 81 MG tablet Take 1 tablet (81 mg total) by mouth daily. Swallow whole. 11/10/20  Yes Bensimhon, Shaune Pascal, MD  FARXIGA 10 MG TABS tablet TAKE 1 TABLET (10 MG TOTAL) BY MOUTH DAILY BEFORE BREAKFAST. Patient taking differently: Take 10 mg by mouth daily. 08/16/21  Yes Bensimhon, Shaune Pascal, MD  Glucerna (GLUCERNA) LIQD Take 237 mLs by mouth daily in the afternoon.   Yes [provider]  ipratropium-albuterol (DUONEB) 0.5-2.5 (3) MG/3ML SOLN Take 3 mLs by nebulization 2 (two) times daily as needed (for shortness of breath).   Yes [provider]  ivabradine (CORLANOR) 7.5 MG TABS tablet Take 1 tablet (7.5 mg total) by mouth 2 (two) times daily with  a meal. 11/19/20  Yes Bensimhon, Shaune Pascal, MD  metolazone (ZAROXOLYN) 2.5 MG tablet Take 2.5 mg by mouth daily as needed (edema).   Yes [provider]  nitroGLYCERIN (NITROSTAT) 0.4 MG SL tablet Place 1 tablet (0.4 mg total) under the tongue every 5 (five) minutes as needed for chest pain. 02/14/19  Yes Shirley Friar, PA-C  potassium chloride SA (KLOR-CON) 20 MEQ tablet Take 20 mEq by mouth as directed. Take only if taking Metolazone   Yes [provider]  spironolactone (ALDACTONE) 25 MG tablet Take 0.5 tablets (12.5 mg total) by mouth daily. 03/01/21  Yes Clegg, Amy D, NP  torsemide (DEMADEX) 20 MG tablet Take 60 mg by mouth daily.   Yes [provider]  atorvastatin (LIPITOR) 80 MG tablet Take 1 tablet (80 mg total) by mouth daily at 6 PM. Patient not taking: Reported on 09/29/2021 07/03/18   Bensimhon, Shaune Pascal, MD  blood glucose meter kit and supplies KIT Dispense based on patient and insurance preference. Use up to four times daily as  directed. 04/11/21   Bonnielee Haff, MD    Past Medical History: Past Medical History:  Diagnosis Date   Acute systolic congestive heart failure ([place]) 02/03/2018   Allergy    Anxiety    Asthma    DM2 (diabetes mellitus, type 2) ([place]) 10/20/2020   Hypertension    PAF (paroxysmal atrial fibrillation) ([facility])    Prophylactic measure 08/03/14-08/19/14   Prophyl. 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   LIMA to LAD, SVG to D1, SVG to OM1, EVH via right thigh with implantation of Impella LD LVAD via direct aortic approach   Small cell lung cancer ([place]) 03/16/2014    Past Surgical History: Past Surgical History:  Procedure Laterality Date   BRONCHIAL BRUSHINGS  10/25/2020   Procedure: BRONCHIAL BRUSHINGS;  Surgeon: Collene Gobble, MD;  Location: [facility] ENDOSCOPY;  Service: Pulmonary;;   BRONCHIAL NEEDLE ASPIRATION BIOPSY  10/25/2020   Procedure: BRONCHIAL NEEDLE ASPIRATION BIOPSIES;  Surgeon: Collene Gobble, MD;  Location: [place] ENDOSCOPY;  Service: Pulmonary;;   CARDIAC DEFIBRILLATOR PLACEMENT  08/15/2018   MDT Visia AF MRI VR ICD implanted by Dr Loel Lofty for primary prevention of sudden   CESAREAN SECTION     CORONARY ARTERY BYPASS GRAFT N/A 02/03/2018   Procedure: CORONARY ARTERY BYPASS GRAFTING (CABG);  Surgeon: Rexene Alberts, MD;  Location: [place];  Service: Open Heart Surgery;  Laterality: N/A;  Time 3 using left internal mammary artery and endoscopically harvested right saphenous vein   CORONARY BALLOON ANGIOPLASTY N/A 02/03/2018   Procedure: CORONARY BALLOON ANGIOPLASTY;  Surgeon: Martinique, Peter M, MD;  Location: [place] CV LAB;  Service: Cardiovascular;  Laterality: N/A;   CORONARY STENT INTERVENTION N/A 02/12/2019   Procedure: CORONARY STENT INTERVENTION;  Surgeon: Wellington Hampshire, MD;  Location: [place] CV LAB;  Service: Cardiovascular;  Laterality: N/A;   CORONARY/GRAFT ACUTE MI REVASCULARIZATION N/A 02/03/2018   Procedure: Coronary/Graft Acute MI  Revascularization;  Surgeon: Martinique, Peter M, MD;  Location: [place] CV LAB;  Service: Cardiovascular;  Laterality: N/A;   ENDOBRONCHIAL ULTRASOUND N/A 10/25/2020   Procedure: ENDOBRONCHIAL ULTRASOUND;  Surgeon: Collene Gobble, MD;  Location: [facility] ENDOSCOPY;  Service: Pulmonary;  Laterality: N/A;   FLEXIBLE BRONCHOSCOPY  10/25/2020   Procedure: FLEXIBLE BRONCHOSCOPY;  Surgeon: Collene Gobble, MD;  Location: [facility] ENDOSCOPY;  Service: Pulmonary;;   IABP INSERTION N/A 02/03/2018   Procedure: IABP Insertion;  Surgeon: Martinique,  Ander Slade, MD;  Location: [place] CV LAB;  Service: Cardiovascular;  Laterality: N/A;   INTRAOPERATIVE TRANSESOPHAGEAL ECHOCARDIOGRAM N/A 02/03/2018   Procedure: INTRAOPERATIVE TRANSESOPHAGEAL ECHOCARDIOGRAM;  Surgeon: Rexene Alberts, MD;  Location: [place];  Service: Open Heart Surgery;  Laterality: N/A;   LEFT HEART CATH AND CORONARY ANGIOGRAPHY N/A 02/03/2018   Procedure: LEFT HEART CATH AND CORONARY ANGIOGRAPHY;  Surgeon: Martinique, Peter M, MD;  Location: [place] CV LAB;  Service: Cardiovascular;  Laterality: N/A;   LEFT HEART CATH AND CORS/GRAFTS ANGIOGRAPHY N/A 02/12/2019   Procedure: LEFT HEART CATH AND CORS/GRAFTS ANGIOGRAPHY;  Surgeon: Wellington Hampshire, MD;  Location: [place] CV LAB;  Service: Cardiovascular;  Laterality: N/A;   MEDIASTINOSCOPY N/A 03/11/2014   Procedure: MEDIASTINOSCOPY;  Surgeon: Melrose Nakayama, MD;  Location: [place];  Service: Thoracic;  Laterality: N/A;   PLACEMENT OF IMPELLA LEFT VENTRICULAR ASSIST DEVICE  02/03/2018   Procedure: PLACEMENT OF [place] LEFT VENTRICULAR ASSIST DEVICE LD;  Surgeon: Rexene Alberts, MD;  Location: [place];  Service: Open Heart Surgery;;   REMOVAL OF [place] LEFT VENTRICULAR ASSIST DEVICE N/A 02/08/2018   Procedure: REMOVAL OF [place] LEFT VENTRICULAR ASSIST DEVICE;  Surgeon: Rexene Alberts, MD;  Location: [place];  Service: Open Heart Surgery;  Laterality: N/A;   RIGHT HEART CATH N/A 02/03/2018   Procedure: RIGHT  HEART CATH;  Surgeon: Martinique, Peter M, MD;  Location: [place] CV LAB;  Service: Cardiovascular;  Laterality: N/A;   RIGHT HEART CATH N/A 05/09/2018   Procedure: RIGHT HEART CATH;  Surgeon: Jolaine Artist, MD;  Location: [place] CV LAB;  Service: Cardiovascular;  Laterality: N/A;   TEE WITHOUT CARDIOVERSION N/A 02/08/2018   Procedure: TRANSESOPHAGEAL ECHOCARDIOGRAM (TEE);  Surgeon: Rexene Alberts, MD;  Location: [place];  Service: Open Heart Surgery;  Laterality: N/A;   TUBAL LIGATION     VIDEO BRONCHOSCOPY WITH ENDOBRONCHIAL ULTRASOUND N/A 03/11/2014   Procedure: VIDEO BRONCHOSCOPY WITH ENDOBRONCHIAL ULTRASOUND;  Surgeon: Melrose Nakayama, MD;  Location: [place];  Service: Thoracic;  Laterality: N/A;    Family History: Family History  Problem Relation Age of Onset   Heart disease Mother    Hypertension Mother    Heart attack Mother    Hypertension Maternal Grandmother    Cancer Maternal Grandmother    Diabetes Paternal Grandmother    Stroke Neg Hx     Social History: Social History   Socioeconomic History   Marital status: Married    Spouse name: Michell Heinrich    Number of children: 1   Years of education: Not on file   Highest education level: Not on file  Occupational History   Not on file  Tobacco Use   Smoking status: Former    Packs/day: 1.00    Years: 20.00    Pack years: 20.00    Types: Cigarettes    Quit date: 03/13/2014    Years since quitting: 7.5   Smokeless tobacco: Never  Vaping Use   Vaping Use: Never used  Substance and Sexual Activity   Alcohol use: Yes    Alcohol/week: 8.0 standard drinks    Types: 8 Glasses of wine per week   Drug use: Yes    Types: Marijuana    Comment: "medicinal"   Sexual activity: Never  Other Topics Concern   Not on file  Social History Narrative   Not on file   Social Determinants of Health   Financial Resource Strain: Medium Risk   Difficulty of  Paying Living Expenses: Somewhat hard  Food Insecurity:  Food Insecurity Present   Worried About Running Out of Food in the Last Year: Sometimes true   Ran Out of Food in the Last Year: Never true  Transportation Needs: Unmet Transportation Needs   Lack of Transportation (Medical): No   Lack of Transportation (Non-Medical): Yes  Physical Activity: Insufficiently Active   Days of Exercise per Week: 2 days   Minutes of Exercise per Session: 10 min  Stress: Stress Concern Present   Feeling of Stress : To some extent  Social Connections: Unknown   Frequency of Communication with Friends and Family: More than three times a week   Frequency of Social Gatherings with Friends and Family: Twice a week   Attends Religious Services: Not on Electrical engineer or Organizations: Not on file   Attends Archivist Meetings: Never   Marital Status: Married    Allergies:  Allergies  Allergen Reactions   Codeine Nausea And Vomiting    Objective:    Vital Signs:   Temp:  [98.6 F (37 C)-98.8 F (37.1 C)] 98.6 F (37 C) (10/20 0845) Pulse Rate:  [77-113] 92 (10/20 0900) Resp:  [11-34] 23 (10/20 0900) BP: (91-163)/(55-118) 113/78 (10/20 0900) SpO2:  [98 %-100 %] 99 % (10/20 0900) FiO2 (%):  [40 %] 40 % (10/20 0019)    Weight change: There were no vitals filed for this visit.  Intake/Output:  No intake or output data in the 24 hours ending 09/29/21 1003    Physical Exam    General: chronically ill, thing and fatigued appearing. No resp difficulty currently  HEENT: normal Neck: supple. JVP elevated to jaw . Carotids 2+ bilat; no bruits. No lymphadenopathy or thyromegaly appreciated. Cor: PMI nondisplaced. Regular rate & rhythm. No rubs, gallops or murmurs. Lungs: faint expiratory wheezing LUL  Abdomen: soft, nontender, nondistended. No hepatosplenomegaly. No bruits or masses. Good bowel sounds. Extremities: no cyanosis, clubbing, rash, edema Neuro: alert & orientedx3, cranial nerves grossly intact. moves all 4  extremities w/o difficulty. Affect pleasant   Telemetry   NSr 90 bpm   EKG    NSR 83 bpm, BAE, no acute ST abnormalities   Labs   Basic Metabolic Panel: Recent Labs  Lab 09/28/21 2113 09/28/21 2324  NA 137 139  K 4.6 4.4  CL 106  --   CO2 21*  --   GLUCOSE 125*  --   BUN 15  --   CREATININE 1.06*  --   CALCIUM 9.8  --     Liver Function Tests: No results for input(s): AST, ALT, ALKPHOS, BILITOT, PROT, ALBUMIN in the last 168 hours. No results for input(s): LIPASE, AMYLASE in the last 168 hours. No results for input(s): AMMONIA in the last 168 hours.  CBC: Recent Labs  Lab 09/28/21 2113 09/28/21 2324  WBC 10.0  --   HGB 16.0* 17.3*  HCT 51.5* 51.0*  MCV 95.9  --   PLT 182  --     Cardiac Enzymes: No results for input(s): CKTOTAL, CKMB, CKMBINDEX, TROPONINI in the last 168 hours.  BNP: BNP (last 3 results) Recent Labs    04/08/21 2315 05/11/21 1512 09/28/21 2327  BNP 1,310.5* 836.0* 1,864.0*    ProBNP (last 3 results) Recent Labs    07/19/21 0945  PROBNP 3,162*     CBG: Recent Labs  Lab 09/29/21 0750  GLUCAP 255*    Coagulation Studies: No results for input(s): LABPROT, INR in  the last 72 hours.   Imaging   DG Chest 2 View  Result Date: 09/28/2021 CLINICAL DATA:  Chest pain EXAM: CHEST - 2 VIEW COMPARISON:  April 08, 2021 FINDINGS: Single lead left chest AICD/pacemaker. Prior median sternotomy and CABG. Similar enlargement of the cardiac silhouette. Chronic bronchitic lung changes and pulmonary hyperinflation. Chronic right greater than left basilar scarring versus atelectasis. Stable small right pleural effusion. No overt pulmonary edema. IMPRESSION: 1. COPD with mild stable right greater than left basilar scarring versus atelectasis. 2. Stable small right pleural effusion. Electronically Signed   By: Dahlia Bailiff M.D.   On: 09/28/2021 21:47   CT Chest Wo Contrast  Result Date: 09/28/2021 CLINICAL DATA:  Shortness of breath,  chest pain EXAM: CT CHEST WITHOUT CONTRAST TECHNIQUE: Multidetector CT imaging of the chest was performed following the standard protocol without IV contrast. COMPARISON:  Chest radiographs dated 09/28/2021. CTA chest dated 10/20/2020. FINDINGS: Cardiovascular: Cardiomegaly. No pericardial effusion. Left subclavian ICD. No evidence of thoracic aortic aneurysm. Atherosclerotic calcifications of the arch. Three vessel coronary atherosclerosis. Mediastinum/Nodes: No suspicious mediastinal lymphadenopathy. Visualized thyroid is unremarkable. Lungs/Pleura: Radiation changes in the right perihilar region, unchanged. Mild centrilobular and paraseptal emphysematous changes, upper lung predominant. Evaluation lung parenchyma is constrained by respiratory motion. Within that constraint, there are no suspicious pulmonary nodules. No focal consolidation. No pleural effusion or pneumothorax. Upper Abdomen: Visualized upper abdomen is notable for motion degradation and mild vascular calcifications. Musculoskeletal: Median sternotomy. IMPRESSION: No evidence of acute cardiopulmonary disease. Radiation changes in the right perihilar region. Aortic Atherosclerosis (ICD10-I70.0) and Emphysema (ICD10-J43.9). Electronically Signed   By: Julian Hy M.D.   On: 09/28/2021 23:50     Medications:     Current Medications:  aspirin EC  81 mg Oral Daily   atorvastatin  80 mg Oral q1800   enoxaparin (LOVENOX) injection  40 mg Subcutaneous Daily   furosemide  80 mg Intravenous BID   insulin aspart  0-15 Units Subcutaneous TID AC & HS   ipratropium-albuterol  3 mL Nebulization Q6H   ivabradine  7.5 mg Oral BID WC   methylPREDNISolone (SOLU-MEDROL) injection  120 mg Intravenous Daily   metolazone  2.5 mg Oral Daily    Infusions:      Assessment/Plan    1. Acute on Chronic Systolic Heart Failure - Echo 09/04/18 ([facility]) LVEF 20%, Moderate AI, Mild MR, Mild TR, Severe LAE, RV mildly decreased.  - Echo 02/12/19: EF  25-30%, RV mildly reduced - Echo 11/21 EF 20-25% RV mild HK. - Echo 02/2021 EF < 20% RV normal - Has ICD. Not candidate for CRT-D upgrade given narrow QRS  - Awaiting insurance auth for Barostim Implant  - Chronically NYHA Class III-IIIb. Not candidate for transplant given advanced lung disease  - Now w/ a/c CHF w/ fluid overload. Ran out of [place] ~ 2 weeks ago  - Diurese w/ IV Lasix, 80 mg bid - Restart Farxiga 10. PharmD to assist w/ outpatient assistance  - Spiro 12.5 mg daily  - Failed Entresto previously due to hyperkalemia   2. Acute COPD Exacerbation  - per IM   3. CAD  - Hx of NSTEMI/STEMI s/p Emergent CABG 02/03/18: Cath with severe 2v CAD as above with severe LV dysfunction/ICM. s/p Emergent CABG 02/03/18.   - Had NSTEMI and now s/p LHC 02/12/19 with DES to the ostial ramus extending into the distal left main.  - No longer on brillinta due to bleeding.  -On ASA.  -Continue hi-intensity statin. -Recent  CP not c/w cardiac etiology, suspect due to COPDE. HS low level and flat  - continue medical therapy    Length of Stay: 0  Lyda Jester, PA-C  09/29/2021, 10:03 AM  Advanced Heart Failure Team Pager [phone] (M-F; 7a - 5p)  Please contact [facility] for night-coverage after hours (4p -7a ) and weekends on [email or web address]  Patient seen with PA, agree with the above note.   She was admitted with 2-3 days of dyspnea, wheezing, and chest heaviness/pain.  The chest pain has been present constantly for 2 days but gets worse with coughing.  HS-TnI minimally elevated with no trend.   She is being treated for COPD exacerbation and CHF exacerbation.   General: NAD Neck: JVP 12-13 cm, no thyromegaly or thyroid nodule.  Lungs: Bilateral wheezes.  CV: Lateral PMI.  Heart regular S1/S2, no S3/S4, no murmur.  No peripheral edema.  No carotid bruit.  Normal pedal pulses.  Abdomen: Soft, nontender, no hepatosplenomegaly, no distention.  Skin: Intact without lesions or rashes.   Neurologic: Alert and oriented x 3.  Psych: Normal affect. Extremities: No clubbing or cyanosis.  HEENT: Normal.   Agree that she has components of both COPD and CHF.     Treat COPD as per IM, Solumedrol IV and nebs for now.   Ischemic cardiomyopathy with acute exacerbation.  She has volume overload on exam. Creatinine stable.  - Can continue home spironolactone and Farxiga.  - She got metolazone 2.5 x 1 today, continue Lasix 80 mg IV bid.  Follow response to determine need for metolazone tomorrow.  Good UOP today it appears.  - Not on Entresto due to issues with hyperkalemia in past.  - Trying to get insurance approval to place barostim device (this is an outpatient issue).   Atypical chest pain, suspect due to COPD/wheezing and perhaps CHF/volume overload.  Pain has been constant but worse with cough.  HS-TnI low with no trend, suspect demand ischemia from volume overload and not ACS.   Loralie Champagne 09/29/2021 11:28 AM

## 2021-09-29 NOTE — Assessment & Plan Note (Addendum)
   Patient's presentation with shortness of breath over the past 48 hours is likely multifactorial in nature   Minimal evidence of pulmonary edema on chest imaging perhaps slight evidence of volume overload on exam.  However BNP is markedly increased from 836 previously to 1864 on today's presentation.    For now, I have placed the patient on intravenous diuretics with 80 mg of Lasix twice daily as well as daily metolazone.  Considering patient is not severely volume overloaded this can likely be de-escalated or discontinued quickly. . Strict input and output monitoring . Currently on BiPAP therapy for work of breathing, will attempt to wean off of BiPAP in the next several hours . Monitoring renal function and electrolytes with serial chemistires

## 2021-09-29 NOTE — Assessment & Plan Note (Signed)
   Currently rate controlled on home regimen of ivabradine  Patient is not on anticoagulation in the outpatient setting due to bleeding complications  Monitoring on telemetry

## 2021-09-29 NOTE — Assessment & Plan Note (Signed)
Strict intake and output monitoring Creatinine near baseline Minimizing nephrotoxic agents as much as possible Serial chemistries to monitor renal function and electrolytes  

## 2021-09-29 NOTE — ED Provider Notes (Signed)
I assumed care at signout to follow-up on imaging and labs.  CT chest is essentially unremarkable for acute issues.  Troponin is only minimally elevated.  Patient became abruptly short of breath with wheezing bilaterally.  She was ill-appearing and diaphoretic.  Patient was trialed on noninvasive ventilation and is now improved.  She has wheezing, but overall appears improved. Her work of breathing is improving.  Patient will be admitted to the hospital.  This was discussed with the patient.  Consulted Dr. Cyd Silence with hospitalist for admission  .Critical Care E&M Performed by: Ripley Fraise, MD  Critical care provider statement:    Critical care time (minutes):  34   Critical care start time:  09/29/2021 1:00 AM   Critical care end time:  09/29/2021 1:34 AM   Critical care time was exclusive of:  Separately billable procedures and treating other patients   Critical care was necessary to treat or prevent imminent or life-threatening deterioration of the following conditions:  Respiratory failure   Critical care was time spent personally by me on the following activities:  Discussions with consultants, development of treatment plan with patient or surrogate, pulse oximetry, ordering and review of laboratory studies, ordering and performing treatments and interventions, re-evaluation of patient's condition and examination of patient   I assumed direction of critical care for this patient from another provider in my specialty: yes (assumed care in signout from Dr. Armandina Gemma)     Care discussed with: admitting provider   After initial E/M assessment, critical care services were subsequently performed that were exclusive of separately billable procedures or treatment.      Ripley Fraise, MD 09/29/21 [phone]

## 2021-09-29 NOTE — Progress Notes (Signed)
PROGRESS NOTE  Brittany [name]  DOB: 11-Jan-1961  PCP: Maude Leriche, PA-C DGU:[id number]  DOA: 09/28/2021  LOS: 0 days  Hospital Day: 2   Chief Complaint  Patient presents with   Chest Pain    Brief narrative: Brittany [name] is a 60 y.o. female with PMH significant for DM2, HTN, HLD, paroxysmal A. 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 Patient presented to the ED on 10/19 with complaint of shortness of breath for 2 days, worse with minimal exertion, without fever or recent travel.  .  In the ED, she was in respiratory distress and required BiPAP  She was given IV Solu-Medrol, IV Lasix  Labs showed BNP elevated to 1800 CT imaging did not show any acute cardiopulmonary disease.   Admitted to hospitalist service.   See below for details.  Subjective: Patient was seen and examined this afternoon.  Lying down in bed in the ED.  Awaiting for room upstairs. On 2 L oxygen by nasal cannula.  Complains of episodes of spasm on chest wall  Assessment/Plan: Acute on chronic respiratory failure -Presented with 2 days of worsening dyspnea -Likely from a combination of COPD exacerbation and CHF exacerbation. -See management of individual issues below. -At home on 2 L by nasal cannula.  Initially required BiPAP in the ED.  Currently back to baseline.  COPD exacerbation  -Currently on IV steroids, bronchodilators -Has mild scattered and expiratory wheezing.  Reduce Solu-Medrol to 40 mg IV daily.  Acute on chronic combined systolic and diastolic CHF  -Slight volume overload on exam, elevated BNP -Home meds include ivabradine 7.5 mg twice daily, Farxiga 10 mg daily, torsemide 60 mg daily, metolazone 2.5 mg daily as needed, Aldactone 12.5 mg daily. -Lasix IV 80 mg twice daily was started on admission. -Net IO Since  Admission: No IO data has been entered for this period [09/29/21 1345] -Continue to monitor for daily intake output, weight, blood pressure, BNP, renal function and electrolytes. Recent Labs  Lab 09/28/21 2113 09/28/21 2324 09/28/21 2327  BNP  --   --  1,864.0*  BUN 15  --   --   CREATININE 1.06*  --   --   K 4.6 4.4  --     CKD 3a -Creatinine stable Recent Labs    02/12/21 0358 03/01/21 1458 03/14/21 1420 04/08/21 2315 04/09/21 0625 04/10/21 0235 04/11/21 0440 05/11/21 1512 07/19/21 0945 09/28/21 2113  BUN 33* 17 22* 20 19 35* 33* 19 21 15   CREATININE 1.12* 1.07* 1.28* 1.23* 0.96 1.31* 1.12* 1.11* 1.08* 1.06*   Type 2 diabetes mellitus Hyperglycemia due to steroids -A1c 5.7 on 10/19 -Home meds include Farxiga 10 mg daily, -Currently blood sugar level is elevated probably because of steroids.  Sliding scales ordered. Recent Labs  Lab 09/29/21 0750 09/29/21 1149  GLUCAP 255* 212*   Paroxysmal A. Fib -On ivabradine. -On aspirin 81 mg daily, not on anticoagulation presumably because of bleeding complications.  Hyperlipidemia -Statin  previous small cell lung cancer  -treated with chemo, chest XRT and prophylactic brain radiation in 2015  Anxiety -Xanax as needed,  Mobility: Encourage ambulation Code Status:   Code Status: Full Code  Nutritional status: There is no height or weight on file to calculate BMI.     Diet:  Diet Order  Diet heart healthy/carb modified Room service appropriate? Yes; Fluid consistency: Thin  Diet effective now                  DVT prophylaxis:  enoxaparin (LOVENOX) injection 40 mg Start: 09/29/21 1000   Antimicrobials: None Fluid: None Consultants: Cardiology Family Communication: None at bedside  Status is: Observation  Remains inpatient appropriate because: Needs further diuresis  Dispo: The patient is from: Home              Anticipated d/c is to: Home in 2 to 3 days              Patient currently  is not medically stable to d/c.   Difficult to place patient No     Infusions:    Scheduled Meds:  aspirin EC  81 mg Oral Daily   atorvastatin  80 mg Oral q1800   dapagliflozin propanediol  10 mg Oral Daily   enoxaparin (LOVENOX) injection  40 mg Subcutaneous Daily   furosemide  80 mg Intravenous BID   insulin aspart  0-15 Units Subcutaneous TID AC & HS   ipratropium-albuterol  3 mL Nebulization Q6H   ivabradine  7.5 mg Oral BID WC   methylPREDNISolone (SOLU-MEDROL) injection  40 mg Intravenous Daily   spironolactone  12.5 mg Oral Daily    Antimicrobials: Anti-infectives (From admission, onward)    None       PRN meds: acetaminophen **OR** acetaminophen, ALPRAZolam, cyclobenzaprine, ipratropium-albuterol, melatonin, nitroGLYCERIN, ondansetron **OR** ondansetron (ZOFRAN) IV, senna-docusate   Objective: Vitals:   09/29/21 1100 09/29/21 1200  BP: (!) 114/52 (!) 109/52  Pulse: 82 70  Resp: (!) 24 16  Temp:    SpO2: 99% 100%   No intake or output data in the 24 hours ending 09/29/21 1345 There were no vitals filed for this visit. Weight change:  There is no height or weight on file to calculate BMI.   Physical Exam: General exam: Pleasant, middle-aged female. Skin: No rashes, lesions or ulcers. HEENT: Atraumatic, normocephalic, no obvious bleeding Lungs: Mild scattered wheezing bilaterally CVS: Regular rate and rhythm, no murmur GI/Abd soft, nontender, nondistended, bowel sound present CNS: Alert, awake, oriented x3 Psychiatry: Mood appropriate Extremities: No pedal edema, no calf tenderness  Data Review: I have personally reviewed the laboratory data and studies available.  Recent Labs  Lab 09/28/21 2113 09/28/21 2324  WBC 10.0  --   HGB 16.0* 17.3*  HCT 51.5* 51.0*  MCV 95.9  --   PLT 182  --    Recent Labs  Lab 09/28/21 2113 09/28/21 2324  NA 137 139  K 4.6 4.4  CL 106  --   CO2 21*  --   GLUCOSE 125*  --   BUN 15  --   CREATININE 1.06*   --   CALCIUM 9.8  --     F/u labs ordered Unresulted Labs (From admission, onward)     Start     Ordered   09/30/21 0500  CBC with Differential/Platelet  Daily,   R      09/29/21 1345   09/30/21 2355  Basic metabolic panel  Daily,   R      09/29/21 1345   09/30/21 0500  Magnesium  Tomorrow morning,   STAT        09/29/21 1345   09/30/21 0500  Phosphorus  Tomorrow morning,   R        09/29/21 1345  Signed, Terrilee Croak, MD [facility] 09/29/2021

## 2021-09-29 NOTE — Assessment & Plan Note (Signed)
.   Patient been placed on Accu-Cheks before every meal and nightly with sliding scale insulin . Holding home regimen of farxiga . Hemoglobin A1C ordered . Diabetic Diet

## 2021-09-29 NOTE — H&P (Addendum)
History and Physical    Brittany [name] HKV:[id number] DOB: 1960-12-24 DOA: 09/28/2021  PCP: Maude Leriche, PA-C  Patient coming from: Home via EMS   Chief Complaint:  Chief Complaint  Patient presents with   Chest Pain     HPI:    60 year old female with past medical history of coronary artery disease (S/P CABG in 2019, DES to ostial ramus 02/2019),  ischemic cardiomyopathy with systolic and diastolic congestive heart failure (Echo 02/2021 EF < 20% with G2DD), COPD with chronic respiratory failure (on 2lpm via [place] QHS), diabetes mellitus type 2, hyperlipidemia, chronic kidney disease stage IIIa and paroxysmal atrial fibrillation who presents to [facility] emergency department via EMS with complaints of shortness of breath.  Patient explains that they began experiencing shortness of breath for approximately the past 2 days.  Shortness of breath was rather rapid in onset, severe in intensity and worse with minimal exertion.  Shortness breath is improved somewhat with rest.  Patient denies fevers, sick contacts, recent travel or contact with confirmed COVID-19 infection.  Patient states that she is compliant with all medications.    Patient also complains of associated chest discomfort, pressure-like in quality, midsternal in location and radiating around the chest in a bandlike distribution.    Patient symptoms continue to worsen.  EMS was contacted who promptly came to evaluate the patient and brought her into [facility] emergency department for evaluation.  Upon evaluation in the emergency department patient was found to be in respiratory distress and was initiated on BiPAP therapy due to work of breathing.  Patient was given several rounds of nebulized bronchodilator therapy in addition to 125 mg of Solu-Medrol and 60 mg of intravenous Lasix.  CT imaging of the chest revealed no obvious evidence of acute cardiopulmonary disease.  Hospitalist group was then called  to assess the patient for admission the hospital.   Review of Systems:   Review of Systems  Respiratory:  Positive for shortness of breath and wheezing.   Cardiovascular:  Positive for chest pain.  Neurological:  Positive for weakness.  All other systems reviewed and are negative.  Past Medical History:  Diagnosis Date   Acute systolic congestive heart failure ([place]) 02/03/2018   Allergy    Anxiety    Asthma    DM2 (diabetes mellitus, type 2) ([place]) 10/20/2020   Hypertension    PAF (paroxysmal atrial fibrillation) ([place])    Prophylactic measure 08/03/14-08/19/14   Prophyl. 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   LIMA to LAD, SVG to D1, SVG to OM1, EVH via right thigh with implantation of Impella LD LVAD via direct aortic approach   Small cell lung cancer ([place]) 03/16/2014    Past Surgical History:  Procedure Laterality Date   BRONCHIAL BRUSHINGS  10/25/2020   Procedure: BRONCHIAL BRUSHINGS;  Surgeon: Collene Gobble, MD;  Location: [facility] ENDOSCOPY;  Service: Pulmonary;;   BRONCHIAL NEEDLE ASPIRATION BIOPSY  10/25/2020   Procedure: BRONCHIAL NEEDLE ASPIRATION BIOPSIES;  Surgeon: Collene Gobble, MD;  Location: [place] ENDOSCOPY;  Service: Pulmonary;;   CARDIAC DEFIBRILLATOR PLACEMENT  08/15/2018   MDT Visia AF MRI VR ICD implanted by Dr Loel Lofty for primary prevention of sudden   CESAREAN SECTION     CORONARY ARTERY BYPASS GRAFT N/A 02/03/2018   Procedure: CORONARY ARTERY BYPASS GRAFTING (CABG);  Surgeon: Rexene Alberts, MD;  Location: [place];  Service: Open Heart Surgery;  Laterality: N/A;  Time 3 using left internal mammary artery  and endoscopically harvested right saphenous vein   CORONARY BALLOON ANGIOPLASTY N/A 02/03/2018   Procedure: CORONARY BALLOON ANGIOPLASTY;  Surgeon: Martinique, Peter M, MD;  Location: [place] CV LAB;  Service: Cardiovascular;  Laterality: N/A;   CORONARY STENT INTERVENTION N/A 02/12/2019   Procedure: CORONARY STENT INTERVENTION;  Surgeon: Wellington Hampshire, MD;  Location: [place] CV LAB;  Service: Cardiovascular;  Laterality: N/A;   CORONARY/GRAFT ACUTE MI REVASCULARIZATION N/A 02/03/2018   Procedure: Coronary/Graft Acute MI Revascularization;  Surgeon: Martinique, Peter M, MD;  Location: [place] CV LAB;  Service: Cardiovascular;  Laterality: N/A;   ENDOBRONCHIAL ULTRASOUND N/A 10/25/2020   Procedure: ENDOBRONCHIAL ULTRASOUND;  Surgeon: Collene Gobble, MD;  Location: [facility] ENDOSCOPY;  Service: Pulmonary;  Laterality: N/A;   FLEXIBLE BRONCHOSCOPY  10/25/2020   Procedure: FLEXIBLE BRONCHOSCOPY;  Surgeon: Collene Gobble, MD;  Location: [facility] ENDOSCOPY;  Service: Pulmonary;;   IABP INSERTION N/A 02/03/2018   Procedure: IABP Insertion;  Surgeon: Martinique, Peter M, MD;  Location: [place] CV LAB;  Service: Cardiovascular;  Laterality: N/A;   INTRAOPERATIVE TRANSESOPHAGEAL ECHOCARDIOGRAM N/A 02/03/2018   Procedure: INTRAOPERATIVE TRANSESOPHAGEAL ECHOCARDIOGRAM;  Surgeon: Rexene Alberts, MD;  Location: [place];  Service: Open Heart Surgery;  Laterality: N/A;   LEFT HEART CATH AND CORONARY ANGIOGRAPHY N/A 02/03/2018   Procedure: LEFT HEART CATH AND CORONARY ANGIOGRAPHY;  Surgeon: Martinique, Peter M, MD;  Location: [place] CV LAB;  Service: Cardiovascular;  Laterality: N/A;   LEFT HEART CATH AND CORS/GRAFTS ANGIOGRAPHY N/A 02/12/2019   Procedure: LEFT HEART CATH AND CORS/GRAFTS ANGIOGRAPHY;  Surgeon: Wellington Hampshire, MD;  Location: [place] CV LAB;  Service: Cardiovascular;  Laterality: N/A;   MEDIASTINOSCOPY N/A 03/11/2014   Procedure: MEDIASTINOSCOPY;  Surgeon: Melrose Nakayama, MD;  Location: [place];  Service: Thoracic;  Laterality: N/A;   PLACEMENT OF IMPELLA LEFT VENTRICULAR ASSIST DEVICE  02/03/2018   Procedure: PLACEMENT OF [place] LEFT VENTRICULAR ASSIST DEVICE LD;  Surgeon: Rexene Alberts, MD;  Location: [place];  Service: Open Heart Surgery;;   REMOVAL OF [place] LEFT VENTRICULAR ASSIST DEVICE N/A 02/08/2018   Procedure: REMOVAL OF  [place] LEFT VENTRICULAR ASSIST DEVICE;  Surgeon: Rexene Alberts, MD;  Location: [place];  Service: Open Heart Surgery;  Laterality: N/A;   RIGHT HEART CATH N/A 02/03/2018   Procedure: RIGHT HEART CATH;  Surgeon: Martinique, Peter M, MD;  Location: [place] CV LAB;  Service: Cardiovascular;  Laterality: N/A;   RIGHT HEART CATH N/A 05/09/2018   Procedure: RIGHT HEART CATH;  Surgeon: Jolaine Artist, MD;  Location: [place] CV LAB;  Service: Cardiovascular;  Laterality: N/A;   TEE WITHOUT CARDIOVERSION N/A 02/08/2018   Procedure: TRANSESOPHAGEAL ECHOCARDIOGRAM (TEE);  Surgeon: Rexene Alberts, MD;  Location: [place];  Service: Open Heart Surgery;  Laterality: N/A;   TUBAL LIGATION     VIDEO BRONCHOSCOPY WITH ENDOBRONCHIAL ULTRASOUND N/A 03/11/2014   Procedure: VIDEO BRONCHOSCOPY WITH ENDOBRONCHIAL ULTRASOUND;  Surgeon: Melrose Nakayama, MD;  Location: [place];  Service: Thoracic;  Laterality: N/A;     reports that she quit smoking about 7 years ago. Her smoking use included cigarettes. She has a 20.00 pack-year smoking history. She has never used smokeless tobacco. She reports current alcohol use of about 8.0 standard drinks per week. She reports current drug use. Drug: Marijuana.  Allergies  Allergen Reactions   Codeine Nausea And Vomiting    Family History  Problem Relation Age of Onset   Heart disease Mother  Hypertension Mother    Heart attack Mother    Hypertension Maternal Grandmother    Cancer Maternal Grandmother    Diabetes Paternal Grandmother    Stroke Neg Hx      Prior to Admission medications   Medication Sig Start Date End Date Taking? Authorizing Provider  albuterol (VENTOLIN HFA) 108 (90 Base) MCG/ACT inhaler Inhale 2 puffs into the lungs every 6 (six) hours as needed for wheezing or shortness of breath. 04/16/19   Bensimhon, Shaune Pascal, MD  ALPRAZolam Duanne Moron) 0.25 MG tablet Take 1 tablet (0.25 mg total) by mouth daily as needed for anxiety. 02/24/19   Shirley Friar, PA-C  aspirin EC 81 MG tablet Take 1 tablet (81 mg total) by mouth daily. Swallow whole. 11/10/20   Bensimhon, Shaune Pascal, MD  atorvastatin (LIPITOR) 80 MG tablet Take 1 tablet (80 mg total) by mouth daily at 6 PM. 07/03/18   Bensimhon, Shaune Pascal, MD  blood glucose meter kit and supplies KIT Dispense based on patient and insurance preference. Use up to four times daily as directed. 04/11/21   Bonnielee Haff, MD  FARXIGA 10 MG TABS tablet TAKE 1 TABLET (10 MG TOTAL) BY MOUTH DAILY BEFORE BREAKFAST. 08/16/21   Bensimhon, Shaune Pascal, MD  Glucerna (GLUCERNA) LIQD Take 237 mLs by mouth daily in the afternoon.    [provider]  ipratropium-albuterol (DUONEB) 0.5-2.5 (3) MG/3ML SOLN Take 3 mLs by nebulization 2 (two) times daily as needed (for shortness of breath).    [provider]  ivabradine (CORLANOR) 7.5 MG TABS tablet Take 1 tablet (7.5 mg total) by mouth 2 (two) times daily with a meal. 11/19/20   Bensimhon, Shaune Pascal, MD  metolazone (ZAROXOLYN) 2.5 MG tablet Take 2.5 mg by mouth as needed.    [provider]  nitroGLYCERIN (NITROSTAT) 0.4 MG SL tablet Place 1 tablet (0.4 mg total) under the tongue every 5 (five) minutes as needed for chest pain. 02/14/19   Shirley Friar, PA-C  potassium chloride SA (KLOR-CON) 20 MEQ tablet Take 20 mEq by mouth daily. With add'l 20 meq with metolazone    [provider]  spironolactone (ALDACTONE) 25 MG tablet Take 0.5 tablets (12.5 mg total) by mouth daily. 03/01/21   Clegg, Amy D, NP  torsemide (DEMADEX) 20 MG tablet Take 60 mg by mouth daily.    [provider]    Physical Exam: Vitals:   09/29/21 0345 09/29/21 0400 09/29/21 0415 09/29/21 0431  BP: 100/65 (!) 91/55 108/64   Pulse: 89 85 83 88  Resp: 18 19  (!) 26  Temp:      TempSrc:      SpO2: 100% 99%  100%    Constitutional: Awake alert and oriented x3, patient is in mild respiratory distress. Skin: no rashes, no lesions, good skin turgor  noted. Eyes: Pupils are equally reactive to light.  No evidence of scleral icterus or conjunctival pallor.  ENMT: BiPAP mask is in place.  Somewhat dry mucous membranes noted.     Neck: normal, supple, no masses, no thyromegaly.  No evidence of jugular venous distension.   Respiratory: Scattered rhonchi bilaterally.   Inspiratory and expiratory wheezing with minimal basilar rales.  Increased respiratory effort. No accessory muscle use.  Cardiovascular: Regular rate and rhythm, no murmurs / rubs / gallops. No extremity edema. 2+ pedal pulses. No carotid bruits.  Chest:   Nontender without crepitus or deformity.   Back:   Nontender without crepitus or deformity. Abdomen: Abdomen  is soft and nontender.  No evidence of intra-abdominal masses.  Positive bowel sounds noted in all quadrants.   Musculoskeletal: No joint deformity upper and lower extremities. Good ROM, no contractures. Normal muscle tone.  Neurologic: CN 2-12 grossly intact. Sensation intact.  Patient moving all 4 extremities spontaneously.  Patient is following all commands.  Patient is responsive to verbal stimuli.   Psychiatric: Patient exhibits anxious mood with appropriate affect.  Patient seems to possess insight as to their current situation.     Labs on Admission: I have personally reviewed following labs and imaging studies -   CBC: Recent Labs  Lab 09/28/21 2113 09/28/21 2324  WBC 10.0  --   HGB 16.0* 17.3*  HCT 51.5* 51.0*  MCV 95.9  --   PLT 182  --    Basic Metabolic Panel: Recent Labs  Lab 09/28/21 2113 09/28/21 2324  NA 137 139  K 4.6 4.4  CL 106  --   CO2 21*  --   GLUCOSE 125*  --   BUN 15  --   CREATININE 1.06*  --   CALCIUM 9.8  --    GFR: CrCl cannot be calculated (Unknown ideal weight.). Liver Function Tests: No results for input(s): AST, ALT, ALKPHOS, BILITOT, PROT, ALBUMIN in the last 168 hours. No results for input(s): LIPASE, AMYLASE in the last 168 hours. No results for input(s): AMMONIA  in the last 168 hours. Coagulation Profile: No results for input(s): INR, PROTIME in the last 168 hours. Cardiac Enzymes: No results for input(s): CKTOTAL, CKMB, CKMBINDEX, TROPONINI in the last 168 hours. BNP (last 3 results) Recent Labs    07/19/21 0945  PROBNP 3,162*   HbA1C: No results for input(s): HGBA1C in the last 72 hours. CBG: No results for input(s): GLUCAP in the last 168 hours. Lipid Profile: No results for input(s): CHOL, HDL, LDLCALC, TRIG, CHOLHDL, LDLDIRECT in the last 72 hours. Thyroid Function Tests: No results for input(s): TSH, T4TOTAL, FREET4, T3FREE, THYROIDAB in the last 72 hours. Anemia Panel: No results for input(s): VITAMINB12, FOLATE, FERRITIN, TIBC, IRON, RETICCTPCT in the last 72 hours. Urine analysis:    Component Value Date/Time   COLORURINE YELLOW 02/27/2018 1255   APPEARANCEUR HAZY (A) 02/27/2018 1255   LABSPEC 1.014 02/27/2018 1255   PHURINE 8.0 02/27/2018 1255   GLUCOSEU NEGATIVE 02/27/2018 1255   HGBUR NEGATIVE 02/27/2018 1255   BILIRUBINUR NEGATIVE 02/27/2018 1255   KETONESUR NEGATIVE 02/27/2018 1255   PROTEINUR NEGATIVE 02/27/2018 1255   UROBILINOGEN 1.0 03/15/2015 1117   NITRITE NEGATIVE 02/27/2018 1255   LEUKOCYTESUR SMALL (A) 02/27/2018 1255    Radiological Exams on Admission - Personally Reviewed: DG Chest 2 View  Result Date: 09/28/2021 CLINICAL DATA:  Chest pain EXAM: CHEST - 2 VIEW COMPARISON:  April 08, 2021 FINDINGS: Single lead left chest AICD/pacemaker. Prior median sternotomy and CABG. Similar enlargement of the cardiac silhouette. Chronic bronchitic lung changes and pulmonary hyperinflation. Chronic right greater than left basilar scarring versus atelectasis. Stable small right pleural effusion. No overt pulmonary edema. IMPRESSION: 1. COPD with mild stable right greater than left basilar scarring versus atelectasis. 2. Stable small right pleural effusion. Electronically Signed   By: Dahlia Bailiff M.D.   On: 09/28/2021  21:47   CT Chest Wo Contrast  Result Date: 09/28/2021 CLINICAL DATA:  Shortness of breath, chest pain EXAM: CT CHEST WITHOUT CONTRAST TECHNIQUE: Multidetector CT imaging of the chest was performed following the standard protocol without IV contrast. COMPARISON:  Chest radiographs dated 09/28/2021. CTA  chest dated 10/20/2020. FINDINGS: Cardiovascular: Cardiomegaly. No pericardial effusion. Left subclavian ICD. No evidence of thoracic aortic aneurysm. Atherosclerotic calcifications of the arch. Three vessel coronary atherosclerosis. Mediastinum/Nodes: No suspicious mediastinal lymphadenopathy. Visualized thyroid is unremarkable. Lungs/Pleura: Radiation changes in the right perihilar region, unchanged. Mild centrilobular and paraseptal emphysematous changes, upper lung predominant. Evaluation lung parenchyma is constrained by respiratory motion. Within that constraint, there are no suspicious pulmonary nodules. No focal consolidation. No pleural effusion or pneumothorax. Upper Abdomen: Visualized upper abdomen is notable for motion degradation and mild vascular calcifications. Musculoskeletal: Median sternotomy. IMPRESSION: No evidence of acute cardiopulmonary disease. Radiation changes in the right perihilar region. Aortic Atherosclerosis (ICD10-I70.0) and Emphysema (ICD10-J43.9). Electronically Signed   By: Julian Hy M.D.   On: 09/28/2021 23:50    EKG: Personally reviewed.  Rhythm is normal sinus rhythm with heart rate of 83 bpm.  Left anterior fascicular block.  No dynamic ST segment changes appreciated.  Assessment/Plan  * COPD exacerbation (HCC) COPD exacerbation additionally contributing to patient's severe multifactorial shortness of breath.  This is likely the primary driver behind the patient's presentation today. Patient's been placed on aggressive bronchodilator therapy with scheduled duo nebs Patient is additionally been placed on Solu-Medrol 60 mg IV every 12 Currently on BiPAP  therapy for work of breathing which we will attempt to wean patient off of in the next several hours   Acute on chronic combined systolic and diastolic CHF (congestive heart failure) ([place]) Patient's presentation with shortness of breath over the past 48 hours is likely multifactorial in nature  Minimal evidence of pulmonary edema on chest imaging perhaps slight evidence of volume overload on exam.  However BNP is markedly increased from 836 previously to 1864 on today's presentation.   For now, I have placed the patient on intravenous diuretics with 80 mg of Lasix twice daily as well as daily metolazone. Considering patient is not severely volume overloaded this can likely be de-escalated or discontinued quickly. Strict input and output monitoring Currently on BiPAP therapy for work of breathing, will attempt to wean off of BiPAP in the next several hours Monitoring renal function and electrolytes with serial chemistires   Chronic kidney disease, stage 3a (HCC) Strict intake and output monitoring Creatinine near baseline Minimizing nephrotoxic agents as much as possible Serial chemistries to monitor renal function and electrolytes   Mixed diabetic hyperlipidemia associated with type 2 diabetes mellitus ([place]) Continuing home regimen of lipid lowering therapy.   Type 2 diabetes mellitus with stage 3a chronic kidney disease, without long-term current use of insulin ([place]) Patient been placed on Accu-Cheks before every meal and nightly with sliding scale insulin Holding home regimen of farxiga Hemoglobin A1C ordered Diabetic Diet   AF (paroxysmal atrial fibrillation) (HCC) Currently rate controlled on home regimen of ivabradine Patient is not on anticoagulation in the outpatient setting due to bleeding complications Monitoring on telemetry      Code Status:  Full code  code status decision has been confirmed with: Patient Family Communication: deferred   Status is:  Observation  The patient remains OBS appropriate and will d/c before 2 midnights.       Vernelle Emerald MD [facility] Pager [phone]  If 7PM-7AM, please contact night-coverage [email or web address] Use universal [place] password for that web site. If you do not have the password, please call the hospital operator.  09/29/2021, 6:14 AM

## 2021-09-29 NOTE — Assessment & Plan Note (Signed)
.   Continuing home regimen of lipid lowering therapy.  

## 2021-09-29 NOTE — Assessment & Plan Note (Signed)
   COPD exacerbation additionally contributing to patient's severe multifactorial shortness of breath.  This is likely the primary driver behind the patient's presentation today.  Patient's been placed on aggressive bronchodilator therapy with scheduled duo nebs  Patient is additionally been placed on Solu-Medrol 60 mg IV every 12  Currently on BiPAP therapy for work of breathing which we will attempt to wean patient off of in the next several hours

## 2021-09-30 DIAGNOSIS — E785 Hyperlipidemia, unspecified: Secondary | ICD-10-CM | POA: Diagnosis present

## 2021-09-30 DIAGNOSIS — Z95810 Presence of automatic (implantable) cardiac defibrillator: Secondary | ICD-10-CM | POA: Diagnosis not present

## 2021-09-30 DIAGNOSIS — Z20822 Contact with and (suspected) exposure to covid-19: Secondary | ICD-10-CM | POA: Diagnosis present

## 2021-09-30 DIAGNOSIS — E1122 Type 2 diabetes mellitus with diabetic chronic kidney disease: Secondary | ICD-10-CM | POA: Diagnosis present

## 2021-09-30 DIAGNOSIS — I5043 Acute on chronic combined systolic (congestive) and diastolic (congestive) heart failure: Secondary | ICD-10-CM | POA: Diagnosis present

## 2021-09-30 DIAGNOSIS — J441 Chronic obstructive pulmonary disease with (acute) exacerbation: Secondary | ICD-10-CM | POA: Diagnosis not present

## 2021-09-30 DIAGNOSIS — Z833 Family history of diabetes mellitus: Secondary | ICD-10-CM | POA: Diagnosis not present

## 2021-09-30 DIAGNOSIS — Z8249 Family history of ischemic heart disease and other diseases of the circulatory system: Secondary | ICD-10-CM | POA: Diagnosis not present

## 2021-09-30 DIAGNOSIS — Z951 Presence of aortocoronary bypass graft: Secondary | ICD-10-CM | POA: Diagnosis not present

## 2021-09-30 DIAGNOSIS — E1169 Type 2 diabetes mellitus with other specified complication: Secondary | ICD-10-CM | POA: Diagnosis present

## 2021-09-30 DIAGNOSIS — R7989 Other specified abnormal findings of blood chemistry: Secondary | ICD-10-CM | POA: Diagnosis present

## 2021-09-30 DIAGNOSIS — J9620 Acute and chronic respiratory failure, unspecified whether with hypoxia or hypercapnia: Secondary | ICD-10-CM | POA: Diagnosis present

## 2021-09-30 DIAGNOSIS — N1831 Chronic kidney disease, stage 3a: Secondary | ICD-10-CM | POA: Diagnosis present

## 2021-09-30 DIAGNOSIS — I255 Ischemic cardiomyopathy: Secondary | ICD-10-CM | POA: Diagnosis present

## 2021-09-30 DIAGNOSIS — I2510 Atherosclerotic heart disease of native coronary artery without angina pectoris: Secondary | ICD-10-CM | POA: Diagnosis present

## 2021-09-30 DIAGNOSIS — Z923 Personal history of irradiation: Secondary | ICD-10-CM | POA: Diagnosis not present

## 2021-09-30 DIAGNOSIS — I480 Paroxysmal atrial fibrillation: Secondary | ICD-10-CM | POA: Diagnosis present

## 2021-09-30 DIAGNOSIS — I130 Hypertensive heart and chronic kidney disease with heart failure and stage 1 through stage 4 chronic kidney disease, or unspecified chronic kidney disease: Secondary | ICD-10-CM | POA: Diagnosis present

## 2021-09-30 DIAGNOSIS — J439 Emphysema, unspecified: Secondary | ICD-10-CM | POA: Diagnosis present

## 2021-09-30 DIAGNOSIS — Z85118 Personal history of other malignant neoplasm of bronchus and lung: Secondary | ICD-10-CM | POA: Diagnosis not present

## 2021-09-30 DIAGNOSIS — I252 Old myocardial infarction: Secondary | ICD-10-CM | POA: Diagnosis not present

## 2021-09-30 DIAGNOSIS — N179 Acute kidney failure, unspecified: Secondary | ICD-10-CM | POA: Diagnosis present

## 2021-09-30 DIAGNOSIS — E875 Hyperkalemia: Secondary | ICD-10-CM | POA: Diagnosis present

## 2021-09-30 DIAGNOSIS — R0602 Shortness of breath: Secondary | ICD-10-CM | POA: Diagnosis present

## 2021-09-30 DIAGNOSIS — Z87891 Personal history of nicotine dependence: Secondary | ICD-10-CM | POA: Diagnosis not present

## 2021-09-30 DIAGNOSIS — I2582 Chronic total occlusion of coronary artery: Secondary | ICD-10-CM | POA: Diagnosis present

## 2021-09-30 LAB — CBC WITH DIFFERENTIAL/PLATELET
Abs Immature Granulocytes: 0.16 10*3/uL — ABNORMAL HIGH (ref 0.00–0.07)
Basophils Absolute: 0 10*3/uL (ref 0.0–0.1)
Basophils Relative: 0 %
Eosinophils Absolute: 0 10*3/uL (ref 0.0–0.5)
Eosinophils Relative: 0 %
HCT: 48.2 % — ABNORMAL HIGH (ref 36.0–46.0)
Hemoglobin: 15.6 g/dL — ABNORMAL HIGH (ref 12.0–15.0)
Immature Granulocytes: 1 %
Lymphocytes Relative: 3 %
Lymphs Abs: 0.6 10*3/uL — ABNORMAL LOW (ref 0.7–4.0)
MCH: 30.2 pg (ref 26.0–34.0)
MCHC: 32.4 g/dL (ref 30.0–36.0)
MCV: 93.2 fL (ref 80.0–100.0)
Monocytes Absolute: 1.1 10*3/uL — ABNORMAL HIGH (ref 0.1–1.0)
Monocytes Relative: 6 %
Neutro Abs: 18.5 10*3/uL — ABNORMAL HIGH (ref 1.7–7.7)
Neutrophils Relative %: 90 %
Platelets: 176 10*3/uL (ref 150–400)
RBC: 5.17 MIL/uL — ABNORMAL HIGH (ref 3.87–5.11)
RDW: 20.3 % — ABNORMAL HIGH (ref 11.5–15.5)
WBC: 20.4 10*3/uL — ABNORMAL HIGH (ref 4.0–10.5)
nRBC: 0 % (ref 0.0–0.2)

## 2021-09-30 LAB — GLUCOSE, CAPILLARY
Glucose-Capillary: 168 mg/dL — ABNORMAL HIGH (ref 70–99)
Glucose-Capillary: 201 mg/dL — ABNORMAL HIGH (ref 70–99)
Glucose-Capillary: 160 mg/dL — ABNORMAL HIGH (ref 70–99)
Glucose-Capillary: 136 mg/dL — ABNORMAL HIGH (ref 70–99)

## 2021-09-30 LAB — BASIC METABOLIC PANEL
Anion gap: 11 (ref 5–15)
BUN: 34 mg/dL — ABNORMAL HIGH (ref 8–23)
CO2: 23 mmol/L (ref 22–32)
Calcium: 10.1 mg/dL (ref 8.9–10.3)
Chloride: 100 mmol/L (ref 98–111)
Creatinine, Ser: 1.46 mg/dL — ABNORMAL HIGH (ref 0.44–1.00)
GFR, Estimated: 41 mL/min — ABNORMAL LOW (ref >60)
Glucose, Bld: 131 mg/dL — ABNORMAL HIGH (ref 70–99)
Potassium: 4.4 mmol/L (ref 3.5–5.1)
Sodium: 134 mmol/L — ABNORMAL LOW (ref 135–145)

## 2021-09-30 LAB — PHOSPHORUS: Phosphorus: 4.1 mg/dL (ref 2.5–4.6)

## 2021-09-30 LAB — MAGNESIUM: Magnesium: 2.3 mg/dL (ref 1.7–2.4)

## 2021-09-30 MED ORDER — GUAIFENESIN-DM 100-10 MG/5ML PO SYRP
5.0000 mL | ORAL_SOLUTION | ORAL | Status: DC | PRN
  Administered 2021-09-30: 5 mL via ORAL
  Filled 2021-09-30: qty 5

## 2021-09-30 NOTE — Progress Notes (Signed)
PROGRESS NOTE  Brittany [name]  DOB: May 19, 1961  PCP: Maude Leriche, PA-C BSJ:[id number]  DOA: 09/28/2021  LOS: 0 days  Hospital Day: 3   Chief Complaint  Patient presents with   Chest Pain    Brief narrative: Brittany [name] is a 60 y.o. female with PMH significant for DM2, HTN, HLD, paroxysmal A. 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 Patient presented to the ED on 10/19 with complaint of shortness of breath for 2 days, worse with minimal exertion, without fever or recent travel.  .  In the ED, she was in respiratory distress and required BiPAP  She was given IV Solu-Medrol, IV Lasix  Labs showed BNP elevated to 1800 CT imaging did not show any acute cardiopulmonary disease.   Admitted to hospitalist service.   See below for details.  Subjective: Patient was seen and examined this morning. Lying down in bed.  On 2 L oxygen. Feels better than at presentation. Creatinine this morning up at 1.46.  A.m. dose of Lasix held.  Assessment/Plan: Acute on chronic respiratory failure -Presented with 2 days of worsening dyspnea -Likely from a combination of COPD exacerbation and CHF exacerbation. -See management of individual issues below. -At home on 2 L by nasal cannula.  Initially required BiPAP in the ED.  Currently back to baseline at 2 L/min.  COPD exacerbation  -Currently on IV Solu-Medrol at 40 mg daily, bronchodilators.  Much improved wheezing on auscultation today.  Acute on chronic combined systolic and diastolic CHF  -Slight volume overload on exam, elevated BNP -Home meds include ivabradine 7.5 mg twice daily, Farxiga 10 mg daily, torsemide 60 mg daily, metolazone 2.5 mg daily as needed, Aldactone 12.5 mg daily. -Lasix IV 80 mg twice daily was started on admission.  Because of elevated creatinine,  I held a.m. dose of Lasix today. -Continue ivabradine, Farxiga, Aldactone. -Net IO Since Admission: -560 mL [09/30/21 1252] -Continue to monitor for daily intake output, weight, blood pressure, BNP, renal function and electrolytes. Recent Labs  Lab 09/28/21 2113 09/28/21 2324 09/28/21 2327 09/30/21 0059  BNP  --   --  1,864.0*  --   BUN 15  --   --  34*  CREATININE 1.06*  --   --  1.46*  K 4.6 4.4  --  4.4  MG  --   --   --  2.3     AKI on CKD 3a -Creatinine was at baseline on admission.  With diuresis, creatinine trended up today.  Diuretics on hold.  Continue to monitor Recent Labs    03/01/21 1458 03/14/21 1420 04/08/21 2315 04/09/21 0625 04/10/21 0235 04/11/21 0440 05/11/21 1512 07/19/21 0945 09/28/21 2113 09/30/21 0059  BUN 17 22* 20 19 35* 33* 19 21 15  34*  CREATININE 1.07* 1.28* 1.23* 0.96 1.31* 1.12* 1.11* 1.08* 1.06* 1.46*    Paroxysmal A. Fib -On ivabradine. -On aspirin 81 mg daily, not on anticoagulation presumably because of bleeding complications.  Type 2 diabetes mellitus Hyperglycemia due to steroids -A1c 5.7 on 10/19 -Home meds include Farxiga 10 mg daily, -Currently blood sugar level is elevated probably because of steroids.  Continue sliding scale insulin.   Recent Labs  Lab 09/29/21 1149 09/29/21 1640 09/29/21 2117 09/30/21 0615 09/30/21 1110  GLUCAP 212* 166* 217* 168* 201*    Hyperlipidemia -Statin  previous small cell lung  cancer  -treated with chemo, chest XRT and prophylactic brain radiation in 2015  Anxiety -Xanax as needed,  Mobility: Encourage ambulation Code Status:   Code Status: Full Code  Nutritional status: Body mass index is 20.45 kg/m.     Diet:  Diet Order             Diet heart healthy/carb modified Room service appropriate? Yes; Fluid consistency: Thin  Diet effective now                  DVT prophylaxis:  enoxaparin (LOVENOX) injection 40 mg Start: 09/29/21 1000   Antimicrobials: None Fluid:  None Consultants: Cardiology Family Communication: None at bedside  Status is: Observation  Remains inpatient appropriate because: CHF management  Dispo: The patient is from: Home              Anticipated d/c is to: Home in 1 to 2 days              Patient currently is not medically stable to d/c.   Difficult to place patient No     Infusions:    Scheduled Meds:  aspirin EC  81 mg Oral Daily   atorvastatin  80 mg Oral q1800   dapagliflozin propanediol  10 mg Oral Daily   enoxaparin (LOVENOX) injection  40 mg Subcutaneous Daily   furosemide  80 mg Intravenous BID   insulin aspart  0-15 Units Subcutaneous TID AC & HS   ipratropium-albuterol  3 mL Nebulization TID   ivabradine  7.5 mg Oral BID WC   methylPREDNISolone (SOLU-MEDROL) injection  40 mg Intravenous Daily   spironolactone  12.5 mg Oral Daily    Antimicrobials: Anti-infectives (From admission, onward)    None       PRN meds: acetaminophen **OR** acetaminophen, ALPRAZolam, cyclobenzaprine, guaiFENesin-dextromethorphan, ipratropium-albuterol, melatonin, nitroGLYCERIN, ondansetron **OR** ondansetron (ZOFRAN) IV, senna-docusate   Objective: Vitals:   09/30/21 0741 09/30/21 0825  BP:  (!) 98/49  Pulse:  84  Resp:  15  Temp:  97.9 F (36.6 C)  SpO2: 98% 98%    Intake/Output Summary (Last 24 hours) at 09/30/2021 1252 Last data filed at 09/30/2021 0357 Gross per 24 hour  Intake 240 ml  Output 800 ml  Net -560 ml   Filed Weights   09/30/21 0344  Weight: 46.7 kg   Weight change:  Body mass index is 20.45 kg/m.   Physical Exam: General exam: Pleasant, middle-aged female.  Not in distress.  Feels better Skin: No rashes, lesions or ulcers. HEENT: Atraumatic, normocephalic, no obvious bleeding Lungs: No wheezing on auscultation today. CVS: Regular rate and rhythm, no murmur GI/Abd soft, nontender, nondistended, bowel sound present CNS: Alert, awake, oriented x3 Psychiatry: Mood  appropriate Extremities: No pedal edema, no calf tenderness  Data Review: I have personally reviewed the laboratory data and studies available.  Recent Labs  Lab 09/28/21 2113 09/28/21 2324 09/30/21 0059  WBC 10.0  --  20.4*  NEUTROABS  --   --  18.5*  HGB 16.0* 17.3* 15.6*  HCT 51.5* 51.0* 48.2*  MCV 95.9  --  93.2  PLT 182  --  176    Recent Labs  Lab 09/28/21 2113 09/28/21 2324 09/30/21 0059  NA 137 139 134*  K 4.6 4.4 4.4  CL 106  --  100  CO2 21*  --  23  GLUCOSE 125*  --  131*  BUN 15  --  34*  CREATININE 1.06*  --  1.46*  CALCIUM 9.8  --  10.1  MG  --   --  2.3  PHOS  --   --  4.1     F/u labs ordered Unresulted Labs (From admission, onward)     Start     Ordered   09/30/21 0500  CBC with Differential/Platelet  Daily,   R      09/29/21 1345   09/30/21 4656  Basic metabolic panel  Daily,   R      09/29/21 1345            Signed, Terrilee Croak, MD [facility] 09/30/2021

## 2021-09-30 NOTE — Progress Notes (Signed)
Patient amb. In hall approx 350 feet tol well. O2 sats on rm air when she came back was 98-99%

## 2021-09-30 NOTE — Progress Notes (Signed)
EPIC Encounter for ICM Monitoring  Patient Name: Brittany [name] is a 60 y.o. female Date: 09/30/2021 Primary Care Physican: Maude Leriche, [place] Primary Cardiologist: [place] Electrophysiologist: [place] 08/23/2021 Weight: 104 lbs    Pt currently hospitalized.    Optivol Thoracic impedance suggesting normal fluid levels.   Prescribed: Torsemide 20 mg Take 3 tablets (60 mg total) daily. Potassium 20 mEq take 2 tablets by mouth daily.  Take 2 tablets (40 mEq total) with Metolazone Metolazone 2.5 mg take by mouth as needed for fluid  Spironolactone 25 mg take 0.5 tablet by mouth daily Farxiga 10 mg take 1 tablet daily   Labs: 09/30/2021 Creatinine 1.46, BUN 34, Potassium 4.4, Sodium 134, GFR 41 09/28/2021 Creatinine 1.06, BUN 15, Potassium 4.6, Sodium 137, GFR 60  07/19/2021 Creatinine 1.08, BUN 21, Potassium 3.7, Sodium 142, GFR 59, NT-Pro BNP 3,162 05/11/2021 Creatinine 1.11, BUN 19, Potassium 3.9, Sodium 138, GFR 57 04/11/2021 Creatinine 1.12, BUN 33, Potassium 4.3, Sodium 138, GFR 56 04/10/2021 Creatinine 1.31, BUN 35, Potassium 3.6, Sodium 136, GFR 47 A complete set of results can be found in Results Review.   Recommendations:  Pt currently hospitalized.   Follow-up plan: ICM clinic phone appointment on 10/10/2021 to recheck fluid levels (post hospitalization).   91 day device clinic remote transmission 10/04/2021.     EP/Cardiology Office Visits:  11/01/2021 with HF clinic NP/PA.   Copy of ICM check sent to Dr. Rayann Heman.     3 month ICM trend: 09/26/2021.    1 Year ICM trend:       [name], RN 09/30/2021 10:46 AM

## 2021-09-30 NOTE — Progress Notes (Addendum)
Advanced Heart Failure Rounding Note  PCP-Cardiologist: None   Subjective:   Admitted with volume overload and placed on IV lasix. I/O not accurate. Ran out of farxiga 2 weeks ago due to cost.   Creatinine trending up 1.06>1.5    Feeling better.  Able to rest today.      Objective:   Weight Range: 46.7 kg Body mass index is 20.45 kg/m.   Vital Signs:   Temp:  [97.9 F (36.6 C)-98.7 F (37.1 C)] 97.9 F (36.6 C) (10/21 0825) Pulse Rate:  [76-94] 84 (10/21 0825) Resp:  [14-17] 15 (10/21 0825) BP: (92-114)/(48-71) 98/49 (10/21 0825) SpO2:  [96 %-100 %] 98 % (10/21 0825) Weight:  [46.7 kg] 46.7 kg (10/21 0344) Last BM Date: 09/30/21  Weight change: Filed Weights   09/30/21 0344  Weight: 46.7 kg    Intake/Output:   Intake/Output Summary (Last 24 hours) at 09/30/2021 1402 Last data filed at 09/30/2021 0357 Gross per 24 hour  Intake 240 ml  Output 800 ml  Net -560 ml      Physical Exam    General:  No resp difficulty HEENT: Normal Neck: Supple. JVP 7-8. Carotids 2+ bilat; no bruits. No lymphadenopathy or thyromegaly appreciated. Cor: PMI nondisplaced. Regular rate & rhythm. No rubs, gallops or murmurs. Lungs: Clear Abdomen: Soft, nontender, nondistended. No hepatosplenomegaly. No bruits or masses. Good bowel sounds. Extremities: No cyanosis, clubbing, rash, edema Neuro: Alert & orientedx3, cranial nerves grossly intact. moves all 4 extremities w/o difficulty. Affect pleasant   Telemetry  SR 70-90s personally reviewed.   EKG    N/A   Labs    CBC Recent Labs    09/28/21 2113 09/28/21 2324 09/30/21 0059  WBC 10.0  --  20.4*  NEUTROABS  --   --  18.5*  HGB 16.0* 17.3* 15.6*  HCT 51.5* 51.0* 48.2*  MCV 95.9  --  93.2  PLT 182  --  981   Basic Metabolic Panel Recent Labs    09/28/21 2113 09/28/21 2324 09/30/21 0059  NA 137 139 134*  K 4.6 4.4 4.4  CL 106  --  100  CO2 21*  --  23  GLUCOSE 125*  --  131*  BUN 15  --  34*   CREATININE 1.06*  --  1.46*  CALCIUM 9.8  --  10.1  MG  --   --  2.3  PHOS  --   --  4.1   Liver Function Tests No results for input(s): AST, ALT, ALKPHOS, BILITOT, PROT, ALBUMIN in the last 72 hours. No results for input(s): LIPASE, AMYLASE in the last 72 hours. Cardiac Enzymes No results for input(s): CKTOTAL, CKMB, CKMBINDEX, TROPONINI in the last 72 hours.  BNP: BNP (last 3 results) Recent Labs    04/08/21 2315 05/11/21 1512 09/28/21 2327  BNP 1,310.5* 836.0* 1,864.0*    ProBNP (last 3 results) Recent Labs    07/19/21 0945  PROBNP 3,162*     D-Dimer No results for input(s): DDIMER in the last 72 hours. Hemoglobin A1C Recent Labs    09/28/21 2113  HGBA1C 5.7*   Fasting Lipid Panel No results for input(s): CHOL, HDL, LDLCALC, TRIG, CHOLHDL, LDLDIRECT in the last 72 hours. Thyroid Function Tests No results for input(s): TSH, T4TOTAL, T3FREE, THYROIDAB in the last 72 hours.  Invalid input(s): FREET3  Other results:   Imaging    No results found.   Medications:     Scheduled Medications:  aspirin EC  81 mg Oral  Daily   atorvastatin  80 mg Oral q1800   dapagliflozin propanediol  10 mg Oral Daily   enoxaparin (LOVENOX) injection  40 mg Subcutaneous Daily   furosemide  80 mg Intravenous BID   insulin aspart  0-15 Units Subcutaneous TID AC & HS   ipratropium-albuterol  3 mL Nebulization TID   ivabradine  7.5 mg Oral BID WC   methylPREDNISolone (SOLU-MEDROL) injection  40 mg Intravenous Daily   spironolactone  12.5 mg Oral Daily    Infusions:   PRN Medications: acetaminophen **OR** acetaminophen, ALPRAZolam, cyclobenzaprine, guaiFENesin-dextromethorphan, ipratropium-albuterol, melatonin, nitroGLYCERIN, ondansetron **OR** ondansetron (ZOFRAN) IV, senna-docusate    Assessment/Plan    1. Acute on Chronic Systolic Heart Failure - Echo 09/04/18 ([facility]) LVEF 20%, Moderate AI, Mild MR, Mild TR, Severe LAE, RV mildly decreased.  - Echo 02/12/19: EF  25-30%, RV mildly reduced - Echo 11/21 EF 20-25% RV mild HK. - Echo 02/2021 EF < 20% RV normal - Has ICD. Not candidate for CRT-D upgrade given narrow QRS  - Awaiting insurance auth for Barostim Implant . - Chronically NYHA Class III-IIIb. Not candidate for transplant given advanced lung disease . Admitted with volume overload. Ran out of [place] ~ 2 weeks ago.  - Diuresed with IV lasix. Renal funciton trending up. Will hold lasix. Tomorrow start torsemide 60 mg daily.  - Continue  Farxiga 10. PharmD to assist w/ outpatient assistance.  - Continue Spiro 12.5 mg daily  - Continue ivabradine 7.5 mg twice a day.  - Failed Entresto previously due to hyperkalemia    2. Acute COPD Exacerbation  - per IM    3. CAD  - Hx of NSTEMI/STEMI s/p Emergent CABG 02/03/18: Cath with severe 2v CAD as above with severe LV dysfunction/ICM. s/p Emergent CABG 02/03/18.   - Had NSTEMI and now s/p LHC 02/12/19 with DES to the ostial ramus extending into the distal left main.  - No longer on brillinta due to bleeding.  -On ASA.  -Continue hi-intensity statin. - no chest pain.  - HS low level and flat  - continue medical therapy   4. AKI  -Creatinine trending up 1.1>1.5 -Hold lasix.  -Check BMET in am    HF Meds for d/c  Torsemide 60 mg daily  Farxiga 10 mg daily  Spironolactone 12.5 mg daily  Ivabradine 7.5 mg twice a day   HF Follow up set up.   Medication concerns reviewed with patient and pharmacy team. Barriers identified: [employer] having trouble paying for it. I sent a message to [facility] regarding coverage assistance.   Prior Authorization for [employer] has been submitted and approved via phone. [id number] . PA# [id number] . Effective dates: 07/23/21 through 08/22/22   Length of Stay: 0  Amy Ninfa Meeker, NP  09/30/2021, 2:02 PM  Advanced Heart Failure Team Pager [phone] (M-F; 7a - 5p)  Please contact [facility] for night-coverage after hours (5p -7a ) and weekends on  [email or web address]  Patient seen and examined with the above-signed Advanced Practice Provider and/or Housestaff. I personally reviewed laboratory data, imaging studies and relevant notes. I independently examined the patient and formulated the important aspects of the plan. I have edited the note to reflect any of my changes or salient points. I have personally discussed the plan with the patient and/or family.  Feels better. Denies SOB, orthopnea or PND. Wheezing resolved. Wants to go home. Scr up slightly   General:  Lying in bed No resp difficulty HEENT: normal Neck: supple. no  JVD. Carotids 2+ bilat; no bruits. No lymphadenopathy or thryomegaly appreciated. Cor: PMI nondisplaced. Regular rate & rhythm. No rubs, gallops or murmurs. Lungs: clear decreased Abdomen: soft, nontender, nondistended. No hepatosplenomegaly. No bruits or masses. Good bowel sounds. Extremities: no cyanosis, clubbing, rash, edema Neuro: alert & orientedx3, cranial nerves grossly intact. moves all 4 extremities w/o difficulty. Affect pleasant  She is much improved. Suspect mainly COPD flare. Hold diuretics. If renal function stable or improved can go home in am on above meds. HF f/u has been arranged.   HF team will sign off.  Glori Bickers, MD  5:55 PM

## 2021-10-01 ENCOUNTER — Other Ambulatory Visit (HOSPITAL_COMMUNITY): Payer: Self-pay | Admitting: Cardiology

## 2021-10-01 LAB — CBC WITH DIFFERENTIAL/PLATELET
Abs Immature Granulocytes: 0.11 10*3/uL — ABNORMAL HIGH (ref 0.00–0.07)
Basophils Absolute: 0 10*3/uL (ref 0.0–0.1)
Basophils Relative: 0 %
Eosinophils Absolute: 0 10*3/uL (ref 0.0–0.5)
Eosinophils Relative: 0 %
HCT: 47.1 % — ABNORMAL HIGH (ref 36.0–46.0)
Hemoglobin: 15 g/dL (ref 12.0–15.0)
Immature Granulocytes: 1 %
Lymphocytes Relative: 7 %
Lymphs Abs: 1 10*3/uL (ref 0.7–4.0)
MCH: 29.9 pg (ref 26.0–34.0)
MCHC: 31.8 g/dL (ref 30.0–36.0)
MCV: 93.8 fL (ref 80.0–100.0)
Monocytes Absolute: 1 10*3/uL (ref 0.1–1.0)
Monocytes Relative: 7 %
Neutro Abs: 12.3 10*3/uL — ABNORMAL HIGH (ref 1.7–7.7)
Neutrophils Relative %: 85 %
Platelets: 166 10*3/uL (ref 150–400)
RBC: 5.02 MIL/uL (ref 3.87–5.11)
RDW: 20.5 % — ABNORMAL HIGH (ref 11.5–15.5)
WBC: 14.4 10*3/uL — ABNORMAL HIGH (ref 4.0–10.5)
nRBC: 0 % (ref 0.0–0.2)

## 2021-10-01 LAB — BASIC METABOLIC PANEL
Anion gap: 6 (ref 5–15)
BUN: 37 mg/dL — ABNORMAL HIGH (ref 8–23)
CO2: 24 mmol/L (ref 22–32)
Calcium: 9.7 mg/dL (ref 8.9–10.3)
Chloride: 104 mmol/L (ref 98–111)
Creatinine, Ser: 1.26 mg/dL — ABNORMAL HIGH (ref 0.44–1.00)
GFR, Estimated: 49 mL/min — ABNORMAL LOW (ref >60)
Glucose, Bld: 96 mg/dL (ref 70–99)
Potassium: 4.3 mmol/L (ref 3.5–5.1)
Sodium: 134 mmol/L — ABNORMAL LOW (ref 135–145)

## 2021-10-01 LAB — GLUCOSE, CAPILLARY: Glucose-Capillary: 103 mg/dL — ABNORMAL HIGH (ref 70–99)

## 2021-10-01 MED ORDER — TORSEMIDE 20 MG PO TABS
60.0000 mg | ORAL_TABLET | Freq: Every day | ORAL | Status: DC
  Administered 2021-10-01: 60 mg via ORAL
  Filled 2021-10-01: qty 3

## 2021-10-01 MED ORDER — PREDNISONE 10 MG PO TABS: ORAL_TABLET | ORAL | 0 refills | Status: DC

## 2021-10-01 NOTE — Progress Notes (Signed)
Discharged to home with family office visits in place teaching done  

## 2021-10-01 NOTE — Discharge Summary (Signed)
Physician Discharge Summary  Brittany [name] WUJ:[id number] DOB: 10-31-1961 DOA: 09/28/2021  PCP: Maude Leriche, PA-C  Admit date: 09/28/2021 Discharge date: 10/01/2021  Admitted From: home Discharge disposition: home   Code Status: Full Code   Discharge Diagnosis:   Principal Problem:   COPD exacerbation ([place]) Active Problems:   AF (paroxysmal atrial fibrillation) ([facility])   Acute on chronic combined systolic and diastolic CHF (congestive heart failure) ([facility])   Type 2 diabetes mellitus with stage 3a chronic kidney disease, without long-term current use of insulin ([place])   Mixed diabetic hyperlipidemia associated with type 2 diabetes mellitus ([facility])   Chronic kidney disease, stage 3a ([place])     Chief Complaint  Patient presents with   Chest Pain    Brief narrative: Brittany [name] is a 60 y.o. female with PMH significant for DM2, HTN, HLD, paroxysmal A. fib, CAD (CABG 2019, DES to ostial ramus 2020), ICM with combined systolic and diastolic CHF (Echo 08/5620 EF < 20% with G2DD), COPD with chronic respiratory failure (on 2lpm via [place] QHS), CKD 3a. Patient presented to the ED on 10/19 with complaint of shortness of breath for 2 days, worse with minimal exertion, without fever or recent travel.  .  In the ED, she was in respiratory distress and required BiPAP  She was given IV Solu-Medrol, IV Lasix  Labs showed BNP elevated to 1800 CT imaging did not show any acute cardiopulmonary disease.   Admitted to hospitalist service.   See below for details.  Subjective: Patient was seen and examined this morning. Lying on bed.  Not in distress.  Not on supplemental oxygen this morning.  Able to ambulate in the hallway without supplemental oxygen.  Creatinine improving.  Blood pressure stable.  Hospital course: Acute on chronic respiratory failure -Presented with 2 days of worsening dyspnea -Likely from a combination of COPD exacerbation and CHF exacerbation. -See  management of individual issues below. -At home on 2 L by nasal cannula intermittently.  Initially required BiPAP in the ED.  Gradually weaned down.  Currently not requiring oxygen.  Continue to use oxygen at home intermittently as required.   COPD exacerbation  -Improving with IV Solu-Medrol at 40 mg daily, bronchodilators.  Much improved wheezing on auscultation.  Continue bronchodilators at discharge.  Short tapering course of prednisone.   Acute on chronic combined systolic and diastolic CHF  -Presented with volume overload, elevated BNP. -Adequately diuresed with IV Lasix, around 1.5 L.  Heart failure consult appreciated. -Per recommendation from heart failure service, will discharge the patient to continue ivabradine 7.5 mg twice daily, Farxiga 10 mg daily, torsemide 60 mg daily, Aldactone 12.5 mg daily.  AKI on CKD 3a -Creatinine was at baseline on admission.  With diuresis, creatinine trended up to peak at 1.46, improved today to 1.26. Recent Labs    03/14/21 1420 04/08/21 2315 04/09/21 0625 04/10/21 0235 04/11/21 0440 05/11/21 1512 07/19/21 0945 09/28/21 2113 09/30/21 0059 10/01/21 0144  BUN 22* 20 19 35* 33* 19 21 15  34* 37*  CREATININE 1.28* 1.23* 0.96 1.31* 1.12* 1.11* 1.08* 1.06* 1.46* 1.26*   Paroxysmal A. Fib -On ivabradine. -On aspirin 81 mg daily, not on anticoagulation presumably because of bleeding complications.  Type 2 diabetes mellitus Hyperglycemia due to steroids -A1c 5.7 on 10/19 -Home meds include Farxiga 10 mg daily, -Continue the same.  Hyperlipidemia -Statin  previous small cell lung cancer  -treated with chemo, chest XRT and prophylactic brain radiation in 2015  Anxiety -Xanax as needed,   Allergies  as of 10/01/2021       Reactions   Codeine Nausea And Vomiting        Medication List     TAKE these medications    albuterol 108 (90 Base) MCG/ACT inhaler Commonly known as: VENTOLIN HFA Inhale 2 puffs into the lungs every 6  (six) hours as needed for wheezing or shortness of breath.   ALPRAZolam 0.25 MG tablet Commonly known as: XANAX Take 1 tablet (0.25 mg total) by mouth daily as needed for anxiety.   aspirin EC 81 MG tablet Take 1 tablet (81 mg total) by mouth daily. Swallow whole.   atorvastatin 80 MG tablet Commonly known as: LIPITOR Take 1 tablet (80 mg total) by mouth daily at 6 PM.   blood glucose meter kit and supplies Kit Dispense based on patient and insurance preference. Use up to four times daily as directed.   Farxiga 10 MG Tabs tablet Generic drug: dapagliflozin propanediol TAKE 1 TABLET (10 MG TOTAL) BY MOUTH DAILY BEFORE BREAKFAST. What changed:  how much to take when to take this   Glucerna Liqd Take 237 mLs by mouth daily in the afternoon.   ipratropium-albuterol 0.5-2.5 (3) MG/3ML Soln Commonly known as: DUONEB Take 3 mLs by nebulization 2 (two) times daily as needed (for shortness of breath).   ivabradine 7.5 MG Tabs tablet Commonly known as: CORLANOR Take 1 tablet (7.5 mg total) by mouth 2 (two) times daily with a meal.   metolazone 2.5 MG tablet Commonly known as: ZAROXOLYN Take 2.5 mg by mouth daily as needed (edema).   nitroGLYCERIN 0.4 MG SL tablet Commonly known as: NITROSTAT Place 1 tablet (0.4 mg total) under the tongue every 5 (five) minutes as needed for chest pain.   potassium chloride SA 20 MEQ tablet Commonly known as: KLOR-CON Take 20 mEq by mouth as directed. Take only if taking Metolazone   predniSONE 10 MG tablet Commonly known as: DELTASONE Take 4 tablets daily X 2 days, then, Take 3 tablets daily X 2 days, then, Take 2 tablets daily X 2 days, then, Take 1 tablets daily X 1 day.   spironolactone 25 MG tablet Commonly known as: ALDACTONE Take 0.5 tablets (12.5 mg total) by mouth daily.   torsemide 20 MG tablet Commonly known as: DEMADEX Take 60 mg by mouth daily.        Discharge Instructions:  Diet Recommendation: Cardiac  diet   @BRDDSCINSTRUCTIONS @  Follow ups:    Follow-up Information     [facility] Follow up on 10/10/2021.   Specialty: Cardiology Why: at 08:30 Contact information: [address] [id number] [place] [address] [phone]        Scifres, [name], PA-C Follow up.   Specialty: Physician Assistant Contact information: [place] [address] [phone]         Bensimhon, Shaune Pascal, MD .   Specialty: Cardiology Contact information: [address] [place] [address] [phone]         Thompson Grayer, MD .   Specialty: Cardiology Contact information: [address] [place] [address] [phone]                 Wound care:     Discharge Exam:   Vitals:   09/30/21 2317 10/01/21 0324 10/01/21 0549 10/01/21 0758  BP: 101/61 (!) 95/56  108/70  Pulse: 81 73  82  Resp: 20 15 20  18  Temp: 98.5 F (36.9 C) 97.8 F (36.6 C)  98.1 F (36.7 C)  TempSrc: Oral Oral  Oral  SpO2: 100% 95%  98%  Weight:   47.4 kg     Body mass index is 20.75 kg/m.  General exam: Pleasant, middle-aged female.  Not in distress. Skin: No rashes, lesions or ulcers. HEENT: Atraumatic, normocephalic, no obvious bleeding Lungs: Clear to auscultation bilaterally, no wheezing CVS: Regular rate and rhythm, no murmur GI/Abd soft, nontender, nondistended, bowel sound present CNS: Alert, awake abound x3 Psychiatry: Mood appropriate Extremities: No pedal edema, no calf tenderness  Time coordinating discharge: 35 minutes   The results of significant diagnostics from this hospitalization (including imaging, microbiology, ancillary and laboratory) are listed below for reference.    Procedures and Diagnostic Studies:   DG Chest 2 View  Result Date: 09/28/2021 CLINICAL DATA:  Chest pain EXAM: CHEST - 2 VIEW COMPARISON:  April 08, 2021 FINDINGS:  Single lead left chest AICD/pacemaker. Prior median sternotomy and CABG. Similar enlargement of the cardiac silhouette. Chronic bronchitic lung changes and pulmonary hyperinflation. Chronic right greater than left basilar scarring versus atelectasis. Stable small right pleural effusion. No overt pulmonary edema. IMPRESSION: 1. COPD with mild stable right greater than left basilar scarring versus atelectasis. 2. Stable small right pleural effusion. Electronically Signed   By: Dahlia Bailiff M.D.   On: 09/28/2021 21:47   CT Chest Wo Contrast  Result Date: 09/28/2021 CLINICAL DATA:  Shortness of breath, chest pain EXAM: CT CHEST WITHOUT CONTRAST TECHNIQUE: Multidetector CT imaging of the chest was performed following the standard protocol without IV contrast. COMPARISON:  Chest radiographs dated 09/28/2021. CTA chest dated 10/20/2020. FINDINGS: Cardiovascular: Cardiomegaly. No pericardial effusion. Left subclavian ICD. No evidence of thoracic aortic aneurysm. Atherosclerotic calcifications of the arch. Three vessel coronary atherosclerosis. Mediastinum/Nodes: No suspicious mediastinal lymphadenopathy. Visualized thyroid is unremarkable. Lungs/Pleura: Radiation changes in the right perihilar region, unchanged. Mild centrilobular and paraseptal emphysematous changes, upper lung predominant. Evaluation lung parenchyma is constrained by respiratory motion. Within that constraint, there are no suspicious pulmonary nodules. No focal consolidation. No pleural effusion or pneumothorax. Upper Abdomen: Visualized upper abdomen is notable for motion degradation and mild vascular calcifications. Musculoskeletal: Median sternotomy. IMPRESSION: No evidence of acute cardiopulmonary disease. Radiation changes in the right perihilar region. Aortic Atherosclerosis (ICD10-I70.0) and Emphysema (ICD10-J43.9). Electronically Signed   By: Julian Hy M.D.   On: 09/28/2021 23:50     Labs:   Basic Metabolic Panel: Recent Labs   Lab 09/28/21 2113 09/28/21 2324 09/30/21 0059 10/01/21 0144  NA 137 139 134* 134*  K 4.6 4.4 4.4 4.3  CL 106  --  100 104  CO2 21*  --  23 24  GLUCOSE 125*  --  131* 96  BUN 15  --  34* 37*  CREATININE 1.06*  --  1.46* 1.26*  CALCIUM 9.8  --  10.1 9.7  MG  --   --  2.3  --   PHOS  --   --  4.1  --    GFR Estimated Creatinine Clearance: 32.9 mL/min (A) (by C-G formula based on SCr of 1.26 mg/dL (H)). Liver Function Tests: No results for input(s): AST, ALT, ALKPHOS, BILITOT, PROT, ALBUMIN in the last 168 hours. No results for input(s): LIPASE, AMYLASE in the last 168 hours. No results for input(s): AMMONIA in the last 168 hours. Coagulation profile No results for input(s): INR, PROTIME in the last 168 hours.  CBC: Recent Labs  Lab 09/28/21 2113  09/28/21 2324 09/30/21 0059 10/01/21 0144  WBC 10.0  --  20.4* 14.4*  NEUTROABS  --   --  18.5* 12.3*  HGB 16.0* 17.3* 15.6* 15.0  HCT 51.5* 51.0* 48.2* 47.1*  MCV 95.9  --  93.2 93.8  PLT 182  --  176 166   Cardiac Enzymes: No results for input(s): CKTOTAL, CKMB, CKMBINDEX, TROPONINI in the last 168 hours. BNP: Invalid input(s): POCBNP CBG: Recent Labs  Lab 09/30/21 0615 09/30/21 1110 09/30/21 1635 09/30/21 2105 10/01/21 0546  GLUCAP 168* 201* 160* 136* 103*   D-Dimer No results for input(s): DDIMER in the last 72 hours. Hgb A1c Recent Labs    09/28/21 2113  HGBA1C 5.7*   Lipid Profile No results for input(s): CHOL, HDL, LDLCALC, TRIG, CHOLHDL, LDLDIRECT in the last 72 hours. Thyroid function studies No results for input(s): TSH, T4TOTAL, T3FREE, THYROIDAB in the last 72 hours.  Invalid input(s): FREET3 Anemia work up No results for input(s): VITAMINB12, FOLATE, FERRITIN, TIBC, IRON, RETICCTPCT in the last 72 hours. Microbiology Recent Results (from the past 240 hour(s))  Resp Panel by RT-PCR (Flu A&B, Covid) Nasopharyngeal Swab     Status: None   Collection Time: 09/29/21 12:15 AM   Specimen:  Nasopharyngeal Swab; Nasopharyngeal(NP) swabs in vial transport medium  Result Value Ref Range Status   SARS Coronavirus 2 by RT PCR NEGATIVE NEGATIVE Final    Comment: (NOTE) SARS-CoV-2 target nucleic acids are NOT DETECTED.  The SARS-CoV-2 RNA is generally detectable in upper respiratory specimens during the acute phase of infection. The lowest concentration of SARS-CoV-2 viral copies this assay can detect is 138 copies/mL. A negative result does not preclude SARS-Cov-2 infection and should not be used as the sole basis for treatment or other patient management decisions. A negative result may occur with  improper specimen collection/handling, submission of specimen other than nasopharyngeal swab, presence of viral mutation(s) within the areas targeted by this assay, and inadequate number of viral copies(<138 copies/mL). A negative result must be combined with clinical observations, patient history, and epidemiological information. The expected result is Negative.  Fact Sheet for Patients:  EntrepreneurPulse.com.au  Fact Sheet for Healthcare Providers:  IncredibleEmployment.be  This test is no t yet approved or cleared by the [place] FDA and  has been authorized for detection and/or diagnosis of SARS-CoV-2 by FDA under an Emergency Use Authorization (EUA). This EUA will remain  in effect (meaning this test can be used) for the duration of the COVID-19 declaration under Section 564(b)(1) of the Act, 21 U.S.C.section 360bbb-3(b)(1), unless the authorization is terminated  or revoked sooner.       Influenza A by PCR NEGATIVE NEGATIVE Final   Influenza B by PCR NEGATIVE NEGATIVE Final    Comment: (NOTE) The Xpert Xpress SARS-CoV-2/FLU/RSV plus assay is intended as an aid in the diagnosis of influenza from Nasopharyngeal swab specimens and should not be used as a sole basis for treatment. Nasal washings and aspirates are unacceptable for  Xpert Xpress SARS-CoV-2/FLU/RSV testing.  Fact Sheet for Patients: EntrepreneurPulse.com.au  Fact Sheet for Healthcare Providers: IncredibleEmployment.be  This test is not yet approved or cleared by the [place] FDA and has been authorized for detection and/or diagnosis of SARS-CoV-2 by FDA under an Emergency Use Authorization (EUA). This EUA will remain in effect (meaning this test can be used) for the duration of the COVID-19 declaration under Section 564(b)(1) of the Act, 21 U.S.C. section 360bbb-3(b)(1), unless the authorization is terminated or revoked.  Performed at [facility]  [facility], [place] [address], [place], [place] [address]      Signed: Terrilee Croak  [facility] 10/01/2021, 10:30 AM

## 2021-10-04 ENCOUNTER — Ambulatory Visit (INDEPENDENT_AMBULATORY_CARE_PROVIDER_SITE_OTHER): Payer: BC Managed Care – PPO

## 2021-10-04 DIAGNOSIS — I5042 Chronic combined systolic (congestive) and diastolic (congestive) heart failure: Secondary | ICD-10-CM

## 2021-10-04 LAB — CUP PACEART REMOTE DEVICE CHECK
Battery Remaining Longevity: 112 mo
Battery Voltage: 3.01 V
Brady Statistic RV Percent Paced: 0 %
Date Time Interrogation Session: 20221025001702
HighPow Impedance: 81 Ohm
Implantable Lead Implant Date: 20190905
Implantable Lead Location: 753860
Implantable Pulse Generator Implant Date: 20190905
Lead Channel Impedance Value: 380 Ohm
Lead Channel Impedance Value: 456 Ohm
Lead Channel Pacing Threshold Amplitude: 0.75 V
Lead Channel Pacing Threshold Pulse Width: 0.4 ms
Lead Channel Sensing Intrinsic Amplitude: 21.75 mV
Lead Channel Sensing Intrinsic Amplitude: 21.75 mV
Lead Channel Setting Pacing Amplitude: 2 V
Lead Channel Setting Pacing Pulse Width: 0.4 ms
Lead Channel Setting Sensing Sensitivity: 0.3 mV

## 2021-10-08 ENCOUNTER — Other Ambulatory Visit (HOSPITAL_COMMUNITY): Payer: Self-pay | Admitting: Internal Medicine

## 2021-10-09 NOTE — Progress Notes (Signed)
Advanced HF Clinic Note   PCP:  Scifres, Dorothy, PA-C  Cardiologist:  None Primary HF: Dr [name] Oncology: Dr [name]   Chief Complaint: Heart Failure  History of Present Illness: Brittany [name] is a 60 y.o. 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 Underwent emergent cath 02/03/18 showed LAD 100% stenosed, LCx 95% stenosed. Taken for emergent CABG 02/03/18. Required impella post op. Hospital course complicated by cardiogenic shock, HCAP, A fib, respiratory failure, and swallowing issues. She was discharged to SNF. Discharge weight 103 pounds.    In 2019 had multiple hospitalizations for HF and pleural effusion. Underwent pleurodesis at [facility].   Admitted 3/20 with NSTEMI and HF. Received DES to ostial ramus into distal left main based on cath below and underwent diuresis. Meds adjusted as tolerated. Echo with EF 25-30%.   Admitted 10/2020 with COPD flare with mild HF and hemoptysis. CTA showed stable scarring in the right hilum consistent with emphysema.. Underwent bronchoscopy on 11/15, no obvious source of bleeding found on examination.   Echo 11/21 EF 20-25%. RV mildly HK.   Had post hospital f/u on 11/10/20 and was fluid overloaded on device interrogation. She was instructed to take Metolazone 2.69m x 2 days + 40 mEq KCl and torsemide was increased from 40>>60 mg daily.   Had return f/u again on 11/15/20 and was felt to be dry/ over diuresed. Optivol fluid index was way down. Labs were c/w AKI. SCr had bumped from 0.95>>1.82. BUN 14>>38. She was instructed to stop metolazone and hold torsemide x 2 days, then resume at alternating dosing 60 mg one day and 40 mg the next.  Admitted 02/09/21 with chest pain and shortness of breath. HS-Trop with no  trend. Echo with EF < 20%. Treated for AECOPD exacerbation. Breathing improved. Discharged on torsemide 60 mg daily.  Admitted 04/08/21 with ADHF. Diuresed with IV lasix and transitioned back to torsemide 80 mg daily x 4 days then down to torsemide 60 mg daily.   Saw Dr BRadene Gunningin September and is waiting on insurance approval for Barostim.  Admitted 09/29/21 with volume overload and COPD exacerbation. Ran out of farxiga x 2 weeks due to cost. Diuresed with IV lasix and given solumedrol + bronchodilators.  Discharged on 10/01/21.   Today she returns for HF follow up.Overall feeling fine. Mild SOB with exertion. Denies PND/Orthopnea. Appetite ok. Following low salt diet.  No fever or chills. Weight at home 101 pounds. Taking all medications. She has farxiga.   Cardidac Studies: ECHO 2022: EF < 20% RV normal  ECHO 2021: EF 20-25% RV mildly reduced.  ECHO 02/12/19: EF 25-30%, RV mildly reduced ECHO 02/10/21: EF <20%, G2DD  05/18/2021 PFT's: moderately severe airway obstruction and a diffusion defect suggesting emphysema.   LHC 02/12/19: Ost LAD to Prox LAD lesion is 100% stenosed. Ost Cx to Prox Cx lesion is 100% stenosed. Ost Ramus lesion is 99% stenosed. Post intervention, there is a 0% residual stenosis. A drug-eluting stent was successfully placed using a [id number]. SVG and is normal in caliber. The graft exhibits no disease. Ost 1st Diag lesion is 70% stenosed. SVG and is normal in caliber. Prox Graft lesion is 100% stenosed. 1.  Significant underlying two-vessel coronary artery disease with patent SVG  to OM and SVG to diagonal which supplies the LAD territory.  Atretic LIMA to LAD given competitive flow from SVG to diagonal.  Severe ostial ramus artery stenosis not supplied by bypass with his area suggestive of plaque rupture. 2.  Severely elevated left ventricular end-diastolic pressure 34 mmHg.  Left ventricular angiography was not performed. 3.  Successful angioplasty and  drug-eluting stent placement to the ostial ramus extending into the distal left main.  [place] 2019  RA = 8 RV = 49/9 PA = 47/20 (32) PCW = 19 Fick cardiac output/index = 4.3/3.1 PVR = 3.0 wu AO sat = 93% PA sat = 62%, 64%  Assessment: 1. Minimally elevated left heart pressures 2. Mild PAH 3. Normal cardiac output     Past Medical History:  Diagnosis Date   Acute systolic congestive heart failure ([place]) 02/03/2018   Allergy    Anxiety    Asthma    DM2 (diabetes mellitus, type 2) ([place]) 10/20/2020   Hypertension    PAF (paroxysmal atrial fibrillation) ([place])    Prophylactic measure 08/03/14-08/19/14   Prophyl. 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   LIMA to LAD, SVG to D1, SVG to OM1, EVH via right thigh with implantation of Impella LD LVAD via direct aortic approach   Small cell lung cancer ([place]) 03/16/2014   Past Surgical History:  Procedure Laterality Date   BRONCHIAL BRUSHINGS  10/25/2020   Procedure: BRONCHIAL BRUSHINGS;  Surgeon: Collene Gobble, MD;  Location: [facility] ENDOSCOPY;  Service: Pulmonary;;   BRONCHIAL NEEDLE ASPIRATION BIOPSY  10/25/2020   Procedure: BRONCHIAL NEEDLE ASPIRATION BIOPSIES;  Surgeon: Collene Gobble, MD;  Location: [place] ENDOSCOPY;  Service: Pulmonary;;   CARDIAC DEFIBRILLATOR PLACEMENT  08/15/2018   MDT Visia AF MRI VR ICD implanted by Dr Loel Lofty for primary prevention of sudden   CESAREAN SECTION     CORONARY ARTERY BYPASS GRAFT N/A 02/03/2018   Procedure: CORONARY ARTERY BYPASS GRAFTING (CABG);  Surgeon: Rexene Alberts, MD;  Location: [place];  Service: Open Heart Surgery;  Laterality: N/A;  Time 3 using left internal mammary artery and endoscopically harvested right saphenous vein   CORONARY BALLOON ANGIOPLASTY N/A 02/03/2018   Procedure: CORONARY BALLOON ANGIOPLASTY;  Surgeon: Martinique, Peter M, MD;  Location: [place] CV LAB;  Service: Cardiovascular;  Laterality: N/A;   CORONARY STENT INTERVENTION N/A 02/12/2019   Procedure:  CORONARY STENT INTERVENTION;  Surgeon: Wellington Hampshire, MD;  Location: [place] CV LAB;  Service: Cardiovascular;  Laterality: N/A;   CORONARY/GRAFT ACUTE MI REVASCULARIZATION N/A 02/03/2018   Procedure: Coronary/Graft Acute MI Revascularization;  Surgeon: Martinique, Peter M, MD;  Location: [place] CV LAB;  Service: Cardiovascular;  Laterality: N/A;   ENDOBRONCHIAL ULTRASOUND N/A 10/25/2020   Procedure: ENDOBRONCHIAL ULTRASOUND;  Surgeon: Collene Gobble, MD;  Location[facility] ENDOSCOPY;  Service: Pulmonary;  Laterality: N/A;   FLEXIBLE BRONCHOSCOPY  10/25/2020   Procedure: FLEXIBLE BRONCHOSCOPY;  Surgeon: Collene Gobble, MD;  Location: [facility] ENDOSCOPY;  Service: Pulmonary;;   IABP INSERTION N/A 02/03/2018   Procedure: IABP Insertion;  Surgeon: Martinique, Peter M, MD;  Location: [place] CV LAB;  Service: Cardiovascular;  Laterality: N/A;   INTRAOPERATIVE TRANSESOPHAGEAL ECHOCARDIOGRAM N/A 02/03/2018   Procedure: INTRAOPERATIVE TRANSESOPHAGEAL ECHOCARDIOGRAM;  Surgeon: Rexene Alberts, MD;  Location: [place];  Service: Open Heart Surgery;  Laterality: N/A;   LEFT HEART CATH AND CORONARY ANGIOGRAPHY N/A 02/03/2018   Procedure: LEFT HEART CATH AND CORONARY ANGIOGRAPHY;  Surgeon: Martinique, Peter  M, MD;  Location: [place] CV LAB;  Service: Cardiovascular;  Laterality: N/A;   LEFT HEART CATH AND CORS/GRAFTS ANGIOGRAPHY N/A 02/12/2019   Procedure: LEFT HEART CATH AND CORS/GRAFTS ANGIOGRAPHY;  Surgeon: Wellington Hampshire, MD;  Location: [place] CV LAB;  Service: Cardiovascular;  Laterality: N/A;   MEDIASTINOSCOPY N/A 03/11/2014   Procedure: MEDIASTINOSCOPY;  Surgeon: Melrose Nakayama, MD;  Location: [place];  Service: Thoracic;  Laterality: N/A;   PLACEMENT OF IMPELLA LEFT VENTRICULAR ASSIST DEVICE  02/03/2018   Procedure: PLACEMENT OF [place] LEFT VENTRICULAR ASSIST DEVICE LD;  Surgeon: Rexene Alberts, MD;  Location: [place];  Service: Open Heart Surgery;;   REMOVAL OF [place] LEFT VENTRICULAR ASSIST  DEVICE N/A 02/08/2018   Procedure: REMOVAL OF [place] LEFT VENTRICULAR ASSIST DEVICE;  Surgeon: Rexene Alberts, MD;  Location: [place];  Service: Open Heart Surgery;  Laterality: N/A;   RIGHT HEART CATH N/A 02/03/2018   Procedure: RIGHT HEART CATH;  Surgeon: Martinique, Peter M, MD;  Location: [place] CV LAB;  Service: Cardiovascular;  Laterality: N/A;   RIGHT HEART CATH N/A 05/09/2018   Procedure: RIGHT HEART CATH;  Surgeon: Jolaine Artist, MD;  Location: [place] CV LAB;  Service: Cardiovascular;  Laterality: N/A;   TEE WITHOUT CARDIOVERSION N/A 02/08/2018   Procedure: TRANSESOPHAGEAL ECHOCARDIOGRAM (TEE);  Surgeon: Rexene Alberts, MD;  Location: [place];  Service: Open Heart Surgery;  Laterality: N/A;   TUBAL LIGATION     VIDEO BRONCHOSCOPY WITH ENDOBRONCHIAL ULTRASOUND N/A 03/11/2014   Procedure: VIDEO BRONCHOSCOPY WITH ENDOBRONCHIAL ULTRASOUND;  Surgeon: Melrose Nakayama, MD;  Location: [facility] OR;  Service: Thoracic;  Laterality: N/A;     Current Outpatient Medications  Medication Sig Dispense Refill   albuterol (VENTOLIN HFA) 108 (90 Base) MCG/ACT inhaler Inhale 2 puffs into the lungs every 6 (six) hours as needed for wheezing or shortness of breath. 1 Inhaler 0   ALPRAZolam (XANAX) 0.25 MG tablet Take 1 tablet (0.25 mg total) by mouth daily as needed for anxiety. 10 tablet 0   aspirin EC 81 MG tablet Take 1 tablet (81 mg total) by mouth daily. Swallow whole. 30 tablet 3   atorvastatin (LIPITOR) 80 MG tablet Take 1 tablet (80 mg total) by mouth daily at 6 PM. 30 tablet 1   blood glucose meter kit and supplies KIT Dispense based on patient and insurance preference. Use up to four times daily as directed. 1 each 0   FARXIGA 10 MG TABS tablet TAKE 1 TABLET (10 MG TOTAL) BY MOUTH DAILY BEFORE BREAKFAST. 30 tablet 6   Glucerna (GLUCERNA) LIQD Take 237 mLs by mouth daily in the afternoon.     ipratropium-albuterol (DUONEB) 0.5-2.5 (3) MG/3ML SOLN Take 3 mLs by nebulization 2 (two) times daily  as needed (for shortness of breath).     ivabradine (CORLANOR) 7.5 MG TABS tablet Take 1 tablet (7.5 mg total) by mouth 2 (two) times daily with a meal. 60 tablet 5   metolazone (ZAROXOLYN) 2.5 MG tablet Take 2.5 mg by mouth daily as needed (edema).     nitroGLYCERIN (NITROSTAT) 0.4 MG SL tablet Place 1 tablet (0.4 mg total) under the tongue every 5 (five) minutes as needed for chest pain. 90 tablet 5   potassium chloride SA (KLOR-CON) 20 MEQ tablet Take 20 mEq by mouth as directed. Take only if taking Metolazone     predniSONE (DELTASONE) 10 MG tablet Take 4 tablets daily X 2 days, then, Take 3 tablets daily X  2 days, then, Take 2 tablets daily X 2 days, then, Take 1 tablets daily X 1 day. 19 tablet 0   spironolactone (ALDACTONE) 25 MG tablet Take 0.5 tablets (12.5 mg total) by mouth daily. 45 tablet 3   torsemide (DEMADEX) 20 MG tablet TAKE 3 TABLETS BY MOUTH EVERY DAY 270 tablet 1   No current facility-administered medications for this encounter.    Allergies:   Codeine   Social History:  The patient  reports that she quit smoking about 7 years ago. Her smoking use included cigarettes. She has a 20.00 pack-year smoking history. She has never used smokeless tobacco. She reports current alcohol use of about 8.0 standard drinks per week. She reports current drug use. Drug: Marijuana.   Family History:  The patient's family history includes Cancer in her maternal grandmother; Diabetes in her paternal grandmother; Heart attack in her mother; Heart disease in her mother; Hypertension in her maternal grandmother and mother.   ROS:  Please see the history of present illness.   All other systems are personally reviewed and negative.    Vitals:   10/10/21 0847  BP: 100/62  Pulse: 95  SpO2: 98%  Weight: 47.2 kg   Wt Readings from Last 3 Encounters:  10/10/21 47.2 kg  10/01/21 47.4 kg  08/29/21 47.2 kg   PHYSICAL EXAM: General:  Walked in the clinic. No resp difficulty. Thin .  HEENT:  normal Neck: supple. no JVD. Carotids 2+ bilat; no bruits. No lymphadenopathy or thryomegaly appreciated. Cor: PMI nondisplaced. Regular rate & rhythm. No rubs, gallops or murmurs. Lungs: clear Abdomen: soft, nontender, nondistended. No hepatosplenomegaly. No bruits or masses. Good bowel sounds. Extremities: no cyanosis, clubbing, rash, edema Neuro: alert & orientedx3, cranial nerves grossly intact. moves all 4 extremities w/o difficulty. Affect pleasant  EKG: NSR 90 bpm personally reviewed.   Recent Labs: 02/09/2021: TSH 0.765 07/19/2021: ALT 12; NT-Pro BNP 3,162 09/28/2021: B Natriuretic Peptide 1,864.0 09/30/2021: Magnesium 2.3 10/01/2021: BUN 37; Creatinine, Ser 1.26; Hemoglobin 15.0; Platelets 166; Potassium 4.3; Sodium 134   Wt Readings from Last 3 Encounters:  10/10/21 47.2 kg  10/01/21 47.4 kg  08/29/21 47.2 kg    ASSESSMENT AND PLAN:  1. CAD  - Hx of NSTEMI/STEMI s/p Emergent CABG 02/03/18: Cath with severe 2v CAD as above with severe LV dysfunction/ICM. s/p Emergent CABG 02/03/18.   - Had NSTEMI and now s/p LHC 02/12/19 with DES to the ostial ramus extending into the distal left main.  - No chest pain.  - No longer on brillinta due to bleeding.  -On ASA.  -Continue hi-intensity statin.  2.  COPD - No wheezing on exam - On Duonebs  3.Chronic Systolic HF due to ICM. S/p Medtronic single chamber ICD. - Echo 09/04/18 ([facility]) LVEF 20%, Moderate AI, Mild MR, Mild TR, Severe LAE, RV mildly decreased.  - Echo 02/12/19: EF 25-30%, RV mildly reduced - Echo 11/21 EF 20-25% RV mild HK. - Echo 2022 EF < 20% RV normal - Blood Type A +  NYHA III. Optivol- fluid index low. Impedance up. Not suggestive of volume overload. Discussed optivl results.  - NYHA III. Volume status stable. Continue torsemide 60 mg daily.  - Tried entresto 2019 but stopped due to hyperkalemia .  - Continue 12.5 mg spiro daily - Continue corlanor 7.5 mg bid. - Continue Farxiga 10 mg daily. - No b-blocker yet due  to intolerance with low output - Not a candidate for advanced therapies with her degree of lung disease.  Also not a candidate for BATwire study for the same but we are trying to arrange commercial barostim.   - Saw Dr Radene Gunning and was approved for [facility] but waiting on insurance approval.   4. Recurrent pleural effusions s/p pleurodesis Resolved  5. Chronic hypoxic respiratory failure - Multifactorial - Wears O2 PRN at home.  - Sats stable on room air.   - She is followed by pulmonology.    5. PAF - CHA2DS2/VASc is at least 4. (CHF, Vasc disease, HTN, Female).   - She has history of Afib RVR in the past in the setting of her Lung CA.  - Previously on Xarelto but stopped due to hemoptysis.  - EKG today NSR.   - No bleeding issues.  - On ASA.    6. 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  - Chest CT 02/19/18 with mass-like consolidation in R hilum concerning for recurrent tumor.  - Repeat CT 03/27/2018 -No definite findings of locally recurrent tumor in the right lung. 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 Continued chest CT surveillance is advised in 3-6 months. - S/p thoracentesis 04/2018. Cytology negative for malignancy.  - CTA 11/19 stable scarring in the right hilum consistent with emphysema. Bronchoscopy on 11/15, no obvious source of bleeding  Follow up in 2 months with Dr [name] .  Jeanmarie Hubert, NP 10/10/2021 8:51 AM

## 2021-10-10 ENCOUNTER — Ambulatory Visit (INDEPENDENT_AMBULATORY_CARE_PROVIDER_SITE_OTHER): Payer: BC Managed Care – PPO

## 2021-10-10 ENCOUNTER — Other Ambulatory Visit: Payer: Self-pay

## 2021-10-10 ENCOUNTER — Encounter (HOSPITAL_COMMUNITY): Payer: Self-pay

## 2021-10-10 ENCOUNTER — Telehealth (HOSPITAL_COMMUNITY): Payer: Self-pay | Admitting: Surgery

## 2021-10-10 ENCOUNTER — Ambulatory Visit (HOSPITAL_COMMUNITY)
Admit: 2021-10-10 | Discharge: 2021-10-10 | Disposition: A | Discharge status: Home / Self Care | Payer: BC Managed Care – PPO | Attending: Adult Health | Admitting: Adult Health

## 2021-10-10 VITALS — BP 100/62 | HR 95 | Wt 104.0 lb

## 2021-10-10 DIAGNOSIS — I5042 Chronic combined systolic (congestive) and diastolic (congestive) heart failure: Secondary | ICD-10-CM

## 2021-10-10 DIAGNOSIS — Z951 Presence of aortocoronary bypass graft: Secondary | ICD-10-CM | POA: Insufficient documentation

## 2021-10-10 DIAGNOSIS — I480 Paroxysmal atrial fibrillation: Secondary | ICD-10-CM | POA: Diagnosis not present

## 2021-10-10 DIAGNOSIS — I110 Hypertensive heart disease with heart failure: Secondary | ICD-10-CM | POA: Diagnosis present

## 2021-10-10 DIAGNOSIS — J90 Pleural effusion, not elsewhere classified: Secondary | ICD-10-CM | POA: Insufficient documentation

## 2021-10-10 DIAGNOSIS — Z7984 Long term (current) use of oral hypoglycemic drugs: Secondary | ICD-10-CM | POA: Diagnosis not present

## 2021-10-10 DIAGNOSIS — Z95810 Presence of automatic (implantable) cardiac defibrillator: Secondary | ICD-10-CM

## 2021-10-10 DIAGNOSIS — I2510 Atherosclerotic heart disease of native coronary artery without angina pectoris: Secondary | ICD-10-CM | POA: Insufficient documentation

## 2021-10-10 DIAGNOSIS — I252 Old myocardial infarction: Secondary | ICD-10-CM | POA: Insufficient documentation

## 2021-10-10 DIAGNOSIS — Z8249 Family history of ischemic heart disease and other diseases of the circulatory system: Secondary | ICD-10-CM | POA: Insufficient documentation

## 2021-10-10 DIAGNOSIS — Z79899 Other long term (current) drug therapy: Secondary | ICD-10-CM | POA: Insufficient documentation

## 2021-10-10 DIAGNOSIS — J449 Chronic obstructive pulmonary disease, unspecified: Secondary | ICD-10-CM | POA: Insufficient documentation

## 2021-10-10 DIAGNOSIS — Z7952 Long term (current) use of systemic steroids: Secondary | ICD-10-CM | POA: Diagnosis not present

## 2021-10-10 DIAGNOSIS — I5022 Chronic systolic (congestive) heart failure: Secondary | ICD-10-CM | POA: Insufficient documentation

## 2021-10-10 DIAGNOSIS — J9611 Chronic respiratory failure with hypoxia: Secondary | ICD-10-CM | POA: Diagnosis not present

## 2021-10-10 DIAGNOSIS — Z7982 Long term (current) use of aspirin: Secondary | ICD-10-CM | POA: Insufficient documentation

## 2021-10-10 DIAGNOSIS — Z923 Personal history of irradiation: Secondary | ICD-10-CM | POA: Insufficient documentation

## 2021-10-10 DIAGNOSIS — Z955 Presence of coronary angioplasty implant and graft: Secondary | ICD-10-CM | POA: Insufficient documentation

## 2021-10-10 DIAGNOSIS — Z85118 Personal history of other malignant neoplasm of bronchus and lung: Secondary | ICD-10-CM | POA: Diagnosis not present

## 2021-10-10 DIAGNOSIS — Z87891 Personal history of nicotine dependence: Secondary | ICD-10-CM | POA: Insufficient documentation

## 2021-10-10 LAB — BASIC METABOLIC PANEL
Anion gap: 11 (ref 5–15)
BUN: 47 mg/dL — ABNORMAL HIGH (ref 8–23)
CO2: 28 mmol/L (ref 22–32)
Calcium: 9.5 mg/dL (ref 8.9–10.3)
Chloride: 95 mmol/L — ABNORMAL LOW (ref 98–111)
Creatinine, Ser: 1.22 mg/dL — ABNORMAL HIGH (ref 0.44–1.00)
GFR, Estimated: 50 mL/min — ABNORMAL LOW (ref >60)
Glucose, Bld: 160 mg/dL — ABNORMAL HIGH (ref 70–99)
Potassium: 3.2 mmol/L — ABNORMAL LOW (ref 3.5–5.1)
Sodium: 134 mmol/L — ABNORMAL LOW (ref 135–145)

## 2021-10-10 NOTE — Telephone Encounter (Signed)
I contacted patient to review results.  I scheduled repeat labwork on Nov 7th and updated medication list with new Potassium prescription.

## 2021-10-10 NOTE — Patient Instructions (Addendum)
EKG was done today  Labs were done, if any labs are abnormal the clinic will call you  Your physician recommends that you schedule a follow-up appointment in: 2 months  At the [facility], you and your health needs are our priority. As part of our continuing mission to provide you with exceptional heart care, we have created designated Provider Care Teams. These Care Teams include your primary Cardiologist (physician) and Advanced Practice Providers (APPs- Physician Assistants and Nurse Practitioners) who all work together to provide you with the care you need, when you need it.   You may see any of the following providers on your designated Care Team at your next follow up: Dr Glori Bickers Dr Haynes Kerns, NP Lyda Jester, [place] [facility] [place], [place] Audry Riles, PharmD   Please be sure to bring in all your medications bottles to every appointment.    If you have any questions or concerns before your next appointment please send [place] a message through [place] or call our office at [phone].    TO LEAVE A MESSAGE FOR THE NURSE SELECT OPTION 2, PLEASE LEAVE A MESSAGE INCLUDING: YOUR NAME DATE OF BIRTH CALL BACK NUMBER REASON FOR CALL**this is important as we prioritize the call backs  YOU WILL RECEIVE A CALL BACK THE SAME DAY AS LONG AS YOU CALL BEFORE 4:00 PM

## 2021-10-10 NOTE — Telephone Encounter (Signed)
-----   Message from Conrad [place], NP sent at 10/10/2021 12:12 PM EDT ----- K low. Instruct to take 40 meq K daily. Please call. BMET next week.

## 2021-10-11 ENCOUNTER — Telehealth: Payer: Self-pay

## 2021-10-11 NOTE — Telephone Encounter (Signed)
Remote ICM transmission received.  Attempted call to patient regarding ICM remote transmission and no answer or voice mail option.  

## 2021-10-11 NOTE — Progress Notes (Signed)
EPIC Encounter for ICM Monitoring  Patient Name: Brittany [name] is a 60 y.o. female Date: 10/11/2021 Primary Care Physican: Maude Leriche, [place] Primary Cardiologist: [place] Electrophysiologist: Allred 08/23/2021 Weight: 104 lbs    Attempted call to patient and unable to reach.  Transmission reviewed.  Hospitalized 10/19-10/22.  OV with HF clinic was 10/31.   Optivol Thoracic impedance suggesting normal fluid levels.   Prescribed: Torsemide 20 mg Take 3 tablets (60 mg total) daily. Potassium 20 mEq take 2 tablets by mouth daily.  Take 2 tablets (40 mEq total) with Metolazone Metolazone 2.5 mg take by mouth as needed for fluid  Spironolactone 25 mg take 0.5 tablet by mouth daily Farxiga 10 mg take 1 tablet daily   Labs: 10/10/2021 Creatinine 1.22, BUN 47, Potassium 3.2, Sodium 134, GFR 50 10/01/2021 Creatinine 1.26, BUN 37, Potassium 4.3, Sodium 134, GFR 49 09/30/2021 Creatinine 1.46, BUN 34, Potassium 4.4, Sodium 134, GFR 41 09/28/2021 Creatinine 1.06, BUN 15, Potassium 4.6, Sodium 137, GFR 60  07/19/2021 Creatinine 1.08, BUN 21, Potassium 3.7, Sodium 142, GFR 59, NT-Pro BNP 3,162 05/11/2021 Creatinine 1.11, BUN 19, Potassium 3.9, Sodium 138, GFR 57 04/11/2021 Creatinine 1.12, BUN 33, Potassium 4.3, Sodium 138, GFR 56 04/10/2021 Creatinine 1.31, BUN 35, Potassium 3.6, Sodium 136, GFR 47 A complete set of results can be found in Results Review.   Recommendations:  Unable to reach.     Follow-up plan: ICM clinic phone appointment on 11/14/2021.   91 day device clinic remote transmission 01/03/2022.     EP/Cardiology Office Visits:  12/22/2021 with HF clinic NP/PA.   Copy of ICM check sent to Dr. Rayann Heman.      3 month ICM trend: 10/10/2021.    1 Year ICM trend:       [name], RN 10/11/2021 2:06 PM

## 2021-10-13 NOTE — Progress Notes (Signed)
Remote ICD transmission.   

## 2021-10-17 ENCOUNTER — Other Ambulatory Visit (HOSPITAL_COMMUNITY): Payer: BC Managed Care – PPO

## 2021-11-01 ENCOUNTER — Encounter (HOSPITAL_COMMUNITY): Payer: Medicare Other

## 2021-11-14 ENCOUNTER — Ambulatory Visit (INDEPENDENT_AMBULATORY_CARE_PROVIDER_SITE_OTHER): Payer: BC Managed Care – PPO

## 2021-11-14 DIAGNOSIS — I5042 Chronic combined systolic (congestive) and diastolic (congestive) heart failure: Secondary | ICD-10-CM

## 2021-11-14 DIAGNOSIS — Z95810 Presence of automatic (implantable) cardiac defibrillator: Secondary | ICD-10-CM

## 2021-11-14 NOTE — Progress Notes (Signed)
EPIC Encounter for ICM Monitoring  Patient Name: Tranise Forrest is a 60 y.o. female Date: 11/14/2021 Primary Care Physican: Maude Leriche, [place] Primary Cardiologist: [place] Electrophysiologist: Allred 11/14/2021 Weight: 104 lbs      Spoke with patient and heart failure questions reviewed.  Pt reports eating holiday foods for about 4-5 days. She ha tightness under her breasts which is typically her symptom for fluid accumulation.   Optivol Thoracic impedance suggesting possible fluid accumulation starting 11/06/2021.   Prescribed: Torsemide 20 mg Take 3 tablets (60 mg total) daily.  Per pt, Dr [name] has approved her taking extra 20 mg Torsemide when needed.  Potassium 20 mEq take 2 tablets by mouth daily.  Take 2 tablets (40 mEq total) with Metolazone Metolazone 2.5 mg take by mouth as needed for fluid  Spironolactone 25 mg take 0.5 tablet by mouth daily Farxiga 10 mg take 1 tablet daily   Labs: 10/10/2021 Creatinine 1.22, BUN 47, Potassium 3.2, Sodium 134, GFR 50 10/01/2021 Creatinine 1.26, BUN 37, Potassium 4.3, Sodium 134, GFR 49 09/30/2021 Creatinine 1.46, BUN 34, Potassium 4.4, Sodium 134, GFR 41 09/28/2021 Creatinine 1.06, BUN 15, Potassium 4.6, Sodium 137, GFR 60  07/19/2021 Creatinine 1.08, BUN 21, Potassium 3.7, Sodium 142, GFR 59, NT-Pro BNP 3,162 05/11/2021 Creatinine 1.11, BUN 19, Potassium 3.9, Sodium 138, GFR 57 04/11/2021 Creatinine 1.12, BUN 33, Potassium 4.3, Sodium 138, GFR 56 04/10/2021 Creatinine 1.31, BUN 35, Potassium 3.6, Sodium 136, GFR 47 A complete set of results can be found in Results Review.   Recommendations:  She will take extra Torsemide 20 mg x 2 days and then return to prescribed dosage.   Follow-up plan: ICM clinic phone appointment on 11/18/2021 to recheck fluid levels.   91 day device clinic remote transmission 01/03/2022.     EP/Cardiology Office Visits:  12/22/2021 with AHF clinic NP/PA.   Copy of ICM check sent to Dr. Rayann Heman  and Dr [name] as Juluis Rainier.      3 month ICM trend: 11/14/2021.    12-14 Month ICM trend:       [name], RN 11/14/2021 1:15 PM

## 2021-11-18 ENCOUNTER — Ambulatory Visit (INDEPENDENT_AMBULATORY_CARE_PROVIDER_SITE_OTHER): Payer: BC Managed Care – PPO

## 2021-11-18 DIAGNOSIS — I5042 Chronic combined systolic (congestive) and diastolic (congestive) heart failure: Secondary | ICD-10-CM

## 2021-11-18 DIAGNOSIS — Z95810 Presence of automatic (implantable) cardiac defibrillator: Secondary | ICD-10-CM

## 2021-11-18 NOTE — Progress Notes (Signed)
EPIC Encounter for ICM Monitoring  Patient Name: Brittany [name] is a 60 y.o. female Date: 11/18/2021 Primary Care Physican: Maude Leriche, [place] Primary Cardiologist: [place] Electrophysiologist: Allred 11/14/2021 Weight: 104 lbs      Spoke with patient and heart failure questions reviewed.  Pt has no fluid symptoms and feeling fine after taking extra Torsemide x 2 days   Optivol Thoracic impedance suggesting fluid levels returned to normal.   Prescribed: Torsemide 20 mg Take 3 tablets (60 mg total) daily.  Per pt, Dr [name] has approved her taking extra 20 mg Torsemide when needed.  Potassium 20 mEq take 2 tablets by mouth daily.  Take 2 tablets (40 mEq total) with Metolazone Metolazone 2.5 mg take by mouth as needed for fluid  Spironolactone 25 mg take 0.5 tablet by mouth daily Farxiga 10 mg take 1 tablet daily   Labs: 10/10/2021 Creatinine 1.22, BUN 47, Potassium 3.2, Sodium 134, GFR 50 10/01/2021 Creatinine 1.26, BUN 37, Potassium 4.3, Sodium 134, GFR 49 09/30/2021 Creatinine 1.46, BUN 34, Potassium 4.4, Sodium 134, GFR 41 09/28/2021 Creatinine 1.06, BUN 15, Potassium 4.6, Sodium 137, GFR 60  07/19/2021 Creatinine 1.08, BUN 21, Potassium 3.7, Sodium 142, GFR 59, NT-Pro BNP 3,162 05/11/2021 Creatinine 1.11, BUN 19, Potassium 3.9, Sodium 138, GFR 57 04/11/2021 Creatinine 1.12, BUN 33, Potassium 4.3, Sodium 138, GFR 56 04/10/2021 Creatinine 1.31, BUN 35, Potassium 3.6, Sodium 136, GFR 47 A complete set of results can be found in Results Review.   Recommendations:  Recommendation to limit salt intake to 2000 mg daily and fluid intake to 64 oz daily.  Encouraged to call if experiencing any fluid symptoms.    Follow-up plan: ICM clinic phone appointment on 12/19/2021.   91 day device clinic remote transmission 01/03/2022.     EP/Cardiology Office Visits:  12/22/2021 with AHF clinic NP/PA.   Copy of ICM check sent to Dr. Rayann Heman.  3 month ICM trend:  11/18/2021.    12-14 Month ICM trend:       [name], RN 11/18/2021 11:15 AM

## 2021-12-10 ENCOUNTER — Inpatient Hospital Stay (HOSPITAL_COMMUNITY)
Admission: EM | Admit: 2021-12-10 | Discharge: 2021-12-14 | DRG: 291 | Disposition: A | Discharge status: Home / Self Care | Payer: BC Managed Care – PPO | Attending: Internal Medicine | Admitting: Internal Medicine

## 2021-12-10 ENCOUNTER — Encounter (HOSPITAL_COMMUNITY): Payer: Self-pay | Admitting: Emergency Medicine

## 2021-12-10 ENCOUNTER — Other Ambulatory Visit: Payer: Self-pay

## 2021-12-10 ENCOUNTER — Emergency Department (HOSPITAL_COMMUNITY): Payer: BC Managed Care – PPO

## 2021-12-10 DIAGNOSIS — E876 Hypokalemia: Secondary | ICD-10-CM | POA: Diagnosis present

## 2021-12-10 DIAGNOSIS — F419 Anxiety disorder, unspecified: Secondary | ICD-10-CM | POA: Diagnosis present

## 2021-12-10 DIAGNOSIS — I10 Essential (primary) hypertension: Secondary | ICD-10-CM | POA: Diagnosis present

## 2021-12-10 DIAGNOSIS — I351 Nonrheumatic aortic (valve) insufficiency: Secondary | ICD-10-CM | POA: Diagnosis present

## 2021-12-10 DIAGNOSIS — Z7982 Long term (current) use of aspirin: Secondary | ICD-10-CM

## 2021-12-10 DIAGNOSIS — R0602 Shortness of breath: Secondary | ICD-10-CM | POA: Diagnosis not present

## 2021-12-10 DIAGNOSIS — E782 Mixed hyperlipidemia: Secondary | ICD-10-CM | POA: Diagnosis present

## 2021-12-10 DIAGNOSIS — J45909 Unspecified asthma, uncomplicated: Secondary | ICD-10-CM | POA: Diagnosis not present

## 2021-12-10 DIAGNOSIS — Z923 Personal history of irradiation: Secondary | ICD-10-CM

## 2021-12-10 DIAGNOSIS — J9611 Chronic respiratory failure with hypoxia: Secondary | ICD-10-CM | POA: Diagnosis present

## 2021-12-10 DIAGNOSIS — Z87891 Personal history of nicotine dependence: Secondary | ICD-10-CM

## 2021-12-10 DIAGNOSIS — I509 Heart failure, unspecified: Secondary | ICD-10-CM | POA: Insufficient documentation

## 2021-12-10 DIAGNOSIS — I5043 Acute on chronic combined systolic (congestive) and diastolic (congestive) heart failure: Secondary | ICD-10-CM | POA: Diagnosis present

## 2021-12-10 DIAGNOSIS — E119 Type 2 diabetes mellitus without complications: Secondary | ICD-10-CM | POA: Diagnosis present

## 2021-12-10 DIAGNOSIS — I2510 Atherosclerotic heart disease of native coronary artery without angina pectoris: Secondary | ICD-10-CM | POA: Diagnosis present

## 2021-12-10 DIAGNOSIS — N183 Chronic kidney disease, stage 3 unspecified: Secondary | ICD-10-CM | POA: Diagnosis present

## 2021-12-10 DIAGNOSIS — N1831 Chronic kidney disease, stage 3a: Secondary | ICD-10-CM | POA: Diagnosis present

## 2021-12-10 DIAGNOSIS — Z7984 Long term (current) use of oral hypoglycemic drugs: Secondary | ICD-10-CM

## 2021-12-10 DIAGNOSIS — R509 Fever, unspecified: Secondary | ICD-10-CM

## 2021-12-10 DIAGNOSIS — Z9861 Coronary angioplasty status: Secondary | ICD-10-CM

## 2021-12-10 DIAGNOSIS — D72829 Elevated white blood cell count, unspecified: Secondary | ICD-10-CM | POA: Diagnosis present

## 2021-12-10 DIAGNOSIS — Z8249 Family history of ischemic heart disease and other diseases of the circulatory system: Secondary | ICD-10-CM

## 2021-12-10 DIAGNOSIS — I5023 Acute on chronic systolic (congestive) heart failure: Secondary | ICD-10-CM | POA: Diagnosis not present

## 2021-12-10 DIAGNOSIS — Z885 Allergy status to narcotic agent status: Secondary | ICD-10-CM

## 2021-12-10 DIAGNOSIS — R3129 Other microscopic hematuria: Secondary | ICD-10-CM | POA: Diagnosis present

## 2021-12-10 DIAGNOSIS — E1122 Type 2 diabetes mellitus with diabetic chronic kidney disease: Secondary | ICD-10-CM | POA: Diagnosis present

## 2021-12-10 DIAGNOSIS — Z833 Family history of diabetes mellitus: Secondary | ICD-10-CM

## 2021-12-10 DIAGNOSIS — J439 Emphysema, unspecified: Secondary | ICD-10-CM | POA: Diagnosis present

## 2021-12-10 DIAGNOSIS — I480 Paroxysmal atrial fibrillation: Secondary | ICD-10-CM | POA: Diagnosis present

## 2021-12-10 DIAGNOSIS — Z9221 Personal history of antineoplastic chemotherapy: Secondary | ICD-10-CM

## 2021-12-10 DIAGNOSIS — I130 Hypertensive heart and chronic kidney disease with heart failure and stage 1 through stage 4 chronic kidney disease, or unspecified chronic kidney disease: Principal | ICD-10-CM | POA: Diagnosis present

## 2021-12-10 DIAGNOSIS — N280 Ischemia and infarction of kidney: Secondary | ICD-10-CM

## 2021-12-10 DIAGNOSIS — C3490 Malignant neoplasm of unspecified part of unspecified bronchus or lung: Secondary | ICD-10-CM | POA: Diagnosis present

## 2021-12-10 DIAGNOSIS — Z20822 Contact with and (suspected) exposure to covid-19: Secondary | ICD-10-CM | POA: Diagnosis present

## 2021-12-10 DIAGNOSIS — Z79899 Other long term (current) drug therapy: Secondary | ICD-10-CM

## 2021-12-10 DIAGNOSIS — I255 Ischemic cardiomyopathy: Secondary | ICD-10-CM | POA: Diagnosis present

## 2021-12-10 DIAGNOSIS — N1832 Chronic kidney disease, stage 3b: Secondary | ICD-10-CM | POA: Diagnosis present

## 2021-12-10 DIAGNOSIS — Z809 Family history of malignant neoplasm, unspecified: Secondary | ICD-10-CM

## 2021-12-10 DIAGNOSIS — R7989 Other specified abnormal findings of blood chemistry: Secondary | ICD-10-CM | POA: Diagnosis present

## 2021-12-10 DIAGNOSIS — Z85118 Personal history of other malignant neoplasm of bronchus and lung: Secondary | ICD-10-CM

## 2021-12-10 DIAGNOSIS — I252 Old myocardial infarction: Secondary | ICD-10-CM

## 2021-12-10 DIAGNOSIS — Z951 Presence of aortocoronary bypass graft: Secondary | ICD-10-CM

## 2021-12-10 DIAGNOSIS — E1169 Type 2 diabetes mellitus with other specified complication: Secondary | ICD-10-CM | POA: Diagnosis present

## 2021-12-10 DIAGNOSIS — N179 Acute kidney failure, unspecified: Secondary | ICD-10-CM | POA: Diagnosis present

## 2021-12-10 DIAGNOSIS — Z95810 Presence of automatic (implantable) cardiac defibrillator: Secondary | ICD-10-CM | POA: Diagnosis present

## 2021-12-10 LAB — CBC WITH DIFFERENTIAL/PLATELET
Abs Immature Granulocytes: 0 10*3/uL (ref 0.00–0.07)
Basophils Absolute: 0.1 10*3/uL (ref 0.0–0.1)
Basophils Relative: 1 %
Eosinophils Absolute: 0 10*3/uL (ref 0.0–0.5)
Eosinophils Relative: 0 %
HCT: 48.4 % — ABNORMAL HIGH (ref 36.0–46.0)
Hemoglobin: 16 g/dL — ABNORMAL HIGH (ref 12.0–15.0)
Lymphocytes Relative: 8 %
Lymphs Abs: 0.8 10*3/uL (ref 0.7–4.0)
MCH: 31.1 pg (ref 26.0–34.0)
MCHC: 33.1 g/dL (ref 30.0–36.0)
MCV: 94.2 fL (ref 80.0–100.0)
Monocytes Absolute: 0.4 10*3/uL (ref 0.1–1.0)
Monocytes Relative: 4 %
Neutro Abs: 8.8 10*3/uL — ABNORMAL HIGH (ref 1.7–7.7)
Neutrophils Relative %: 87 %
Platelets: 170 10*3/uL (ref 150–400)
RBC: 5.14 MIL/uL — ABNORMAL HIGH (ref 3.87–5.11)
RDW: 21.2 % — ABNORMAL HIGH (ref 11.5–15.5)
WBC: 10.1 10*3/uL (ref 4.0–10.5)
nRBC: 0 % (ref 0.0–0.2)
nRBC: 0 /100 WBC

## 2021-12-10 LAB — RESP PANEL BY RT-PCR (FLU A&B, COVID) ARPGX2
Influenza A by PCR: NEGATIVE
Influenza B by PCR: NEGATIVE
SARS Coronavirus 2 by RT PCR: NEGATIVE

## 2021-12-10 LAB — COMPREHENSIVE METABOLIC PANEL
ALT: 33 U/L (ref 0–44)
AST: 69 U/L — ABNORMAL HIGH (ref 15–41)
Albumin: 4.1 g/dL (ref 3.5–5.0)
Alkaline Phosphatase: 87 U/L (ref 38–126)
Anion gap: 10 (ref 5–15)
BUN: 15 mg/dL (ref 8–23)
CO2: 26 mmol/L (ref 22–32)
Calcium: 9.9 mg/dL (ref 8.9–10.3)
Chloride: 102 mmol/L (ref 98–111)
Creatinine, Ser: 1.19 mg/dL — ABNORMAL HIGH (ref 0.44–1.00)
GFR, Estimated: 52 mL/min — ABNORMAL LOW (ref >60)
Glucose, Bld: 151 mg/dL — ABNORMAL HIGH (ref 70–99)
Potassium: 3.4 mmol/L — ABNORMAL LOW (ref 3.5–5.1)
Sodium: 138 mmol/L (ref 135–145)
Total Bilirubin: 1.5 mg/dL — ABNORMAL HIGH (ref 0.3–1.2)
Total Protein: 7.3 g/dL (ref 6.5–8.1)

## 2021-12-10 LAB — URINALYSIS, ROUTINE W REFLEX MICROSCOPIC
Bacteria, UA: NONE SEEN
Bilirubin Urine: NEGATIVE
Glucose, UA: NEGATIVE mg/dL
Ketones, ur: 5 mg/dL — AB
Nitrite: NEGATIVE
Protein, ur: 100 mg/dL — AB
Specific Gravity, Urine: 1.014 (ref 1.005–1.030)
pH: 6 (ref 5.0–8.0)

## 2021-12-10 LAB — TROPONIN I (HIGH SENSITIVITY)
Troponin I (High Sensitivity): 29 ng/L — ABNORMAL HIGH (ref <18)
Troponin I (High Sensitivity): 30 ng/L — ABNORMAL HIGH (ref <18)

## 2021-12-10 LAB — PROTIME-INR
INR: 1 (ref 0.8–1.2)
Prothrombin Time: 13.6 seconds (ref 11.4–15.2)

## 2021-12-10 LAB — MAGNESIUM: Magnesium: 2 mg/dL (ref 1.7–2.4)

## 2021-12-10 LAB — LIPASE, BLOOD: Lipase: 27 U/L (ref 11–51)

## 2021-12-10 LAB — LACTIC ACID, PLASMA
Lactic Acid, Venous: 1.5 mmol/L (ref 0.5–1.9)
Lactic Acid, Venous: 1.6 mmol/L (ref 0.5–1.9)

## 2021-12-10 LAB — BRAIN NATRIURETIC PEPTIDE: B Natriuretic Peptide: 2294.6 pg/mL — ABNORMAL HIGH (ref 0.0–100.0)

## 2021-12-10 MED ORDER — HEPARIN BOLUS VIA INFUSION
3500.0000 [IU] | Freq: Once | INTRAVENOUS | Status: AC
  Administered 2021-12-10: 3500 [IU] via INTRAVENOUS
  Filled 2021-12-10: qty 3500

## 2021-12-10 MED ORDER — FUROSEMIDE 10 MG/ML IJ SOLN
80.0000 mg | Freq: Two times a day (BID) | INTRAMUSCULAR | Status: DC
  Administered 2021-12-11 – 2021-12-12 (×3): 80 mg via INTRAVENOUS
  Filled 2021-12-10 (×3): qty 8

## 2021-12-10 MED ORDER — DAPAGLIFLOZIN PROPANEDIOL 10 MG PO TABS
10.0000 mg | ORAL_TABLET | Freq: Every day | ORAL | Status: DC
  Administered 2021-12-11 – 2021-12-14 (×4): 10 mg via ORAL
  Filled 2021-12-10 (×4): qty 1

## 2021-12-10 MED ORDER — HYDROCODONE-ACETAMINOPHEN 5-325 MG PO TABS
1.0000 | ORAL_TABLET | ORAL | Status: DC | PRN
  Administered 2021-12-12 (×2): 1 via ORAL
  Filled 2021-12-10 (×3): qty 1

## 2021-12-10 MED ORDER — INSULIN ASPART 100 UNIT/ML IJ SOLN
0.0000 [IU] | Freq: Three times a day (TID) | INTRAMUSCULAR | Status: DC
  Administered 2021-12-11: 3 [IU] via SUBCUTANEOUS
  Administered 2021-12-11: 2 [IU] via SUBCUTANEOUS
  Administered 2021-12-12 – 2021-12-13 (×3): 1 [IU] via SUBCUTANEOUS
  Administered 2021-12-13: 2 [IU] via SUBCUTANEOUS
  Administered 2021-12-13: 3 [IU] via SUBCUTANEOUS
  Administered 2021-12-14: 2 [IU] via SUBCUTANEOUS
  Administered 2021-12-14: 1 [IU] via SUBCUTANEOUS

## 2021-12-10 MED ORDER — POTASSIUM CHLORIDE CRYS ER 20 MEQ PO TBCR
40.0000 meq | EXTENDED_RELEASE_TABLET | Freq: Once | ORAL | Status: AC
  Administered 2021-12-10: 40 meq via ORAL
  Filled 2021-12-10: qty 2

## 2021-12-10 MED ORDER — SODIUM CHLORIDE 0.9 % IV SOLN
1.0000 g | Freq: Once | INTRAVENOUS | Status: AC
  Administered 2021-12-10: 1 g via INTRAVENOUS
  Filled 2021-12-10: qty 10

## 2021-12-10 MED ORDER — HEPARIN (PORCINE) 25000 UT/250ML-% IV SOLN
850.0000 [IU]/h | INTRAVENOUS | Status: DC
  Administered 2021-12-10 – 2021-12-11 (×2): 850 [IU]/h via INTRAVENOUS
  Filled 2021-12-10 (×2): qty 250

## 2021-12-10 MED ORDER — HYDROMORPHONE HCL 1 MG/ML IJ SOLN
0.5000 mg | INTRAMUSCULAR | Status: AC | PRN
  Administered 2021-12-10 – 2021-12-11 (×2): 0.5 mg via INTRAVENOUS
  Filled 2021-12-10 (×2): qty 1

## 2021-12-10 MED ORDER — FUROSEMIDE 10 MG/ML IJ SOLN
80.0000 mg | Freq: Once | INTRAMUSCULAR | Status: AC
  Administered 2021-12-10: 80 mg via INTRAVENOUS
  Filled 2021-12-10: qty 8

## 2021-12-10 MED ORDER — FENTANYL CITRATE PF 50 MCG/ML IJ SOSY
50.0000 ug | PREFILLED_SYRINGE | Freq: Once | INTRAMUSCULAR | Status: AC
Start: 2021-12-10 — End: 2021-12-10
  Administered 2021-12-10: 50 ug via INTRAVENOUS
  Filled 2021-12-10: qty 1

## 2021-12-10 MED ORDER — IVABRADINE HCL 7.5 MG PO TABS
7.5000 mg | ORAL_TABLET | Freq: Two times a day (BID) | ORAL | Status: DC
  Administered 2021-12-11 – 2021-12-14 (×7): 7.5 mg via ORAL
  Filled 2021-12-10 (×8): qty 1

## 2021-12-10 MED ORDER — ATORVASTATIN CALCIUM 80 MG PO TABS
80.0000 mg | ORAL_TABLET | Freq: Every day | ORAL | Status: DC
  Administered 2021-12-11 – 2021-12-13 (×3): 80 mg via ORAL
  Filled 2021-12-10 (×3): qty 1

## 2021-12-10 MED ORDER — IOPAMIDOL (ISOVUE-370) INJECTION 76%
75.0000 mL | Freq: Once | INTRAVENOUS | Status: AC | PRN
  Administered 2021-12-10: 60 mL via INTRAVENOUS

## 2021-12-10 MED ORDER — ACETAMINOPHEN 650 MG RE SUPP: 650.0000 mg | Freq: Four times a day (QID) | RECTAL | Status: DC | PRN

## 2021-12-10 MED ORDER — NITROGLYCERIN 0.4 MG SL SUBL: 0.4000 mg | SUBLINGUAL_TABLET | SUBLINGUAL | Status: DC | PRN

## 2021-12-10 MED ORDER — ACETAMINOPHEN 325 MG PO TABS
650.0000 mg | ORAL_TABLET | Freq: Four times a day (QID) | ORAL | Status: DC | PRN
  Filled 2021-12-10: qty 2

## 2021-12-10 MED ORDER — IPRATROPIUM-ALBUTEROL 0.5-2.5 (3) MG/3ML IN SOLN
3.0000 mL | Freq: Two times a day (BID) | RESPIRATORY_TRACT | Status: DC | PRN
  Administered 2021-12-10 – 2021-12-12 (×3): 3 mL via RESPIRATORY_TRACT
  Filled 2021-12-10 (×3): qty 3

## 2021-12-10 MED ORDER — HEPARIN SODIUM (PORCINE) 5000 UNIT/ML IJ SOLN: 5000.0000 [IU] | Freq: Three times a day (TID) | INTRAMUSCULAR | Status: DC

## 2021-12-10 MED ORDER — SODIUM CHLORIDE 0.9% FLUSH
3.0000 mL | Freq: Two times a day (BID) | INTRAVENOUS | Status: DC
  Administered 2021-12-11 – 2021-12-14 (×6): 3 mL via INTRAVENOUS

## 2021-12-10 MED ORDER — SPIRONOLACTONE 12.5 MG HALF TABLET
12.5000 mg | ORAL_TABLET | Freq: Every day | ORAL | Status: DC
  Administered 2021-12-11 – 2021-12-14 (×4): 12.5 mg via ORAL
  Filled 2021-12-10 (×4): qty 1

## 2021-12-10 MED ORDER — POLYETHYLENE GLYCOL 3350 17 G PO PACK: 17.0000 g | PACK | Freq: Every day | ORAL | Status: DC | PRN

## 2021-12-10 NOTE — ED Notes (Signed)
Patient transported to CT 

## 2021-12-10 NOTE — ED Triage Notes (Addendum)
Pt to triage via [facility] from hotel where she lives.  Reports SOB, productive cough, chills, nausea, vomiting, and R sided abd pain since yesterday.  Rhonchi lower right.  EMS- IV- 20g LAC Zofran 4mg  IV

## 2021-12-10 NOTE — ED Provider Notes (Signed)
[facility] EMERGENCY DEPARTMENT Provider Note   CSN: [id number] Arrival date & time: 12/10/21  1443     History Chief Complaint  Patient presents with   Shortness of Breath   Abdominal Pain    Brittany [name] is a 60 y.o. female.  HPI 60 year old female presents with a chief complaint of right-sided abdominal/back pain.  Started yesterday.  She is also having shortness of breath and some chest pressure that started around the same time yesterday.  The chest symptoms are similar to prior fluid overload problems.  She never gets typical leg swelling.  However the right-sided Back/abdominal pain is new and not typical for her.  She has not taken any of her meds today because of multiple episodes of emesis.  She denies diarrhea or urinary symptoms.  There has been no fever.  She has a chronic cough but nothing significantly changed from her baseline.  Pain is rated as a 10/10 in her flank.  No meds taken for it.  Past Medical History:  Diagnosis Date   Acute respiratory failure ([place]) 03/19/8118   Acute systolic congestive heart failure ([place]) 02/03/2018   Allergy    Anxiety    Asthma    DM2 (diabetes mellitus, type 2) ([place]) 10/20/2020   Hypertension    PAF (paroxysmal atrial fibrillation) (HCC)    Prophylactic measure 08/03/14-08/19/14   Prophyl. 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   LIMA to LAD, SVG to D1, SVG to OM1, EVH via right thigh with implantation of Impella LD LVAD via direct aortic approach   Small cell lung cancer ([place]) 03/16/2014    Patient Active Problem List   Diagnosis Date Noted   CHF exacerbation ([place]) 12/10/2021   Renal infarct ([place]) 12/10/2021   COPD exacerbation ([place]) 09/29/2021   Mixed diabetic hyperlipidemia associated with type 2 diabetes mellitus ([place]) 09/29/2021   Chronic kidney disease, stage 3a ([place]) 09/29/2021   Unstable angina ([place]) 02/09/2021   Hemoptysis 10/20/2020   Type 2 diabetes mellitus with  stage 3a chronic kidney disease, without long-term current use of insulin ([place]) 10/20/2020   Acute on chronic combined systolic and diastolic CHF (congestive heart failure) ([place]) 05/21/2020   Ischemic cardiomyopathy 04/12/2020   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   NSTEMI (non-ST elevated myocardial infarction) ([place]) 02/12/2019   CHF (congestive heart failure) ([place]) 11/24/2018   Recurrent pleural effusion on right 05/05/2018   Chronic respiratory failure with hypoxia (HCC)    S/P CABG (coronary artery bypass graft) 02/03/2018   Asthma    Anxiety    Allergy    HCAP (healthcare-associated pneumonia) 03/15/2015   Chest pain 03/15/2015   Small cell lung cancer ([place]) 03/16/2014   Protein-calorie malnutrition, severe ([place]) 03/14/2014   AF (paroxysmal atrial fibrillation) ([place]) 03/12/2014   Essential hypertension 05/07/2012    Past Surgical History:  Procedure Laterality Date   BRONCHIAL BRUSHINGS  10/25/2020   Procedure: BRONCHIAL BRUSHINGS;  Surgeon: Collene Gobble, MD;  Location: [facility];  Service: Pulmonary;;   BRONCHIAL NEEDLE ASPIRATION BIOPSY  10/25/2020   Procedure: BRONCHIAL NEEDLE ASPIRATION BIOPSIES;  Surgeon: Collene Gobble, MD;  Location: [facility];  Service: Pulmonary;;   CARDIAC DEFIBRILLATOR PLACEMENT  08/15/2018   MDT Visia AF MRI VR ICD implanted by Dr Loel Lofty for primary prevention of sudden   CESAREAN SECTION     CORONARY ARTERY BYPASS GRAFT N/A 02/03/2018   Procedure: CORONARY ARTERY BYPASS GRAFTING (CABG);  Surgeon: Rexene Alberts,  MD;  Location: [facility] OR;  Service: Open Heart Surgery;  Laterality: N/A;  Time 3 using left internal mammary artery and endoscopically harvested right saphenous vein   CORONARY BALLOON ANGIOPLASTY N/A 02/03/2018   Procedure: CORONARY BALLOON ANGIOPLASTY;  Surgeon: Martinique, Peter M, MD;  Location: [facility];  Service: Cardiovascular;  Laterality: N/A;   CORONARY STENT INTERVENTION N/A 02/12/2019    Procedure: CORONARY STENT INTERVENTION;  Surgeon: Wellington Hampshire, MD;  Location: [facility];  Service: Cardiovascular;  Laterality: N/A;   CORONARY/GRAFT ACUTE MI REVASCULARIZATION N/A 02/03/2018   Procedure: Coronary/Graft Acute MI Revascularization;  Surgeon: Martinique, Peter M, MD;  Location: [facility];  Service: Cardiovascular;  Laterality: N/A;   ENDOBRONCHIAL ULTRASOUND N/A 10/25/2020   Procedure: ENDOBRONCHIAL ULTRASOUND;  Surgeon: Collene Gobble, MD;  Location[facility];  Service: Pulmonary;  Laterality: N/A;   FLEXIBLE BRONCHOSCOPY  10/25/2020   Procedure: FLEXIBLE BRONCHOSCOPY;  Surgeon: Collene Gobble, MD;  Location: [facility] ENDOSCOPY;  Service: Pulmonary;;   IABP INSERTION N/A 02/03/2018   Procedure: IABP Insertion;  Surgeon: Martinique, Peter M, MD;  Location: [facility] LAB;  Service: Cardiovascular;  Laterality: N/A;   INTRAOPERATIVE TRANSESOPHAGEAL ECHOCARDIOGRAM N/A 02/03/2018   Procedure: INTRAOPERATIVE TRANSESOPHAGEAL ECHOCARDIOGRAM;  Surgeon: Rexene Alberts, MD;  Location: [facility];  Service: Open Heart Surgery;  Laterality: N/A;   LEFT HEART CATH AND CORONARY ANGIOGRAPHY N/A 02/03/2018   Procedure: LEFT HEART CATH AND CORONARY ANGIOGRAPHY;  Surgeon: Martinique, Peter M, MD;  Location: [facility];  Service: Cardiovascular;  Laterality: N/A;   LEFT HEART CATH AND CORS/GRAFTS ANGIOGRAPHY N/A 02/12/2019   Procedure: LEFT HEART CATH AND CORS/GRAFTS ANGIOGRAPHY;  Surgeon: Wellington Hampshire, MD;  Location: [facility];  Service: Cardiovascular;  Laterality: N/A;   MEDIASTINOSCOPY N/A 03/11/2014   Procedure: MEDIASTINOSCOPY;  Surgeon: Melrose Nakayama, MD;  Location: [facility];  Service: Thoracic;  Laterality: N/A;   PLACEMENT OF IMPELLA LEFT VENTRICULAR ASSIST DEVICE  02/03/2018   Procedure: PLACEMENT OF [place] LEFT VENTRICULAR ASSIST DEVICE LD;  Surgeon: Rexene Alberts, MD;  Location: [facility];  Service: Open Heart Surgery;;   REMOVAL OF [place] LEFT VENTRICULAR  ASSIST DEVICE N/A 02/08/2018   Procedure: REMOVAL OF [place] LEFT VENTRICULAR ASSIST DEVICE;  Surgeon: Rexene Alberts, MD;  Location: [facility];  Service: Open Heart Surgery;  Laterality: N/A;   RIGHT HEART CATH N/A 02/03/2018   Procedure: RIGHT HEART CATH;  Surgeon: Martinique, Peter M, MD;  Location: [facility];  Service: Cardiovascular;  Laterality: N/A;   RIGHT HEART CATH N/A 05/09/2018   Procedure: RIGHT HEART CATH;  Surgeon: Jolaine Artist, MD;  Location: [facility];  Service: Cardiovascular;  Laterality: N/A;   TEE WITHOUT CARDIOVERSION N/A 02/08/2018   Procedure: TRANSESOPHAGEAL ECHOCARDIOGRAM (TEE);  Surgeon: Rexene Alberts, MD;  Location: [facility];  Service: Open Heart Surgery;  Laterality: N/A;   TUBAL LIGATION     VIDEO BRONCHOSCOPY WITH ENDOBRONCHIAL ULTRASOUND N/A 03/11/2014   Procedure: VIDEO BRONCHOSCOPY WITH ENDOBRONCHIAL ULTRASOUND;  Surgeon: Melrose Nakayama, MD;  Location: [facility];  Service: Thoracic;  Laterality: N/A;     OB History   No obstetric history on file.     Family History  Problem Relation Age of Onset   Heart disease Mother    Hypertension Mother    Heart attack Mother    Hypertension Maternal Grandmother    Cancer Maternal Grandmother    Diabetes Paternal  Grandmother    Stroke Neg Hx     Social History   Tobacco Use   Smoking status: Former    Packs/day: 1.00    Years: 20.00    Pack years: 20.00    Types: Cigarettes    Quit date: 03/13/2014    Years since quitting: 7.7   Smokeless tobacco: Never  Vaping Use   Vaping Use: Never used  Substance Use Topics   Alcohol use: Yes    Alcohol/week: 8.0 standard drinks    Types: 8 Glasses of wine per week   Drug use: Yes    Types: Marijuana    Comment: "medicinal"    Home Medications Prior to Admission medications   Medication Sig Start Date End Date Taking? Authorizing Provider  albuterol (VENTOLIN HFA) 108 (90 Base) MCG/ACT inhaler Inhale 2 puffs into the lungs every 6 (six) hours  as needed for wheezing or shortness of breath. 04/16/19  Yes Bensimhon, Shaune Pascal, MD  aspirin EC 81 MG tablet Take 1 tablet (81 mg total) by mouth daily. Swallow whole. 11/10/20  Yes Bensimhon, Shaune Pascal, MD  atorvastatin (LIPITOR) 80 MG tablet Take 1 tablet (80 mg total) by mouth daily at 6 PM. 07/03/18  Yes Bensimhon, Shaune Pascal, MD  CORLANOR 7.5 MG TABS tablet TAKE 1 TABLET (7.5 MG TOTAL) BY MOUTH 2 (TWO) TIMES DAILY WITH A MEAL. Patient taking differently: Take 7.5 mg by mouth 2 (two) times daily with a meal. 10/10/21  Yes Bensimhon, Shaune Pascal, MD  FARXIGA 10 MG TABS tablet TAKE 1 TABLET (10 MG TOTAL) BY MOUTH DAILY BEFORE BREAKFAST. Patient taking differently: Take 10 mg by mouth daily before breakfast. 08/16/21  Yes Bensimhon, Shaune Pascal, MD  Glucerna (GLUCERNA) LIQD Take 237 mLs by mouth daily in the afternoon.   Yes [provider]  ipratropium-albuterol (DUONEB) 0.5-2.5 (3) MG/3ML SOLN Take 3 mLs by nebulization 2 (two) times daily as needed (for shortness of breath).   Yes [provider]  metolazone (ZAROXOLYN) 2.5 MG tablet Take 2.5 mg by mouth daily as needed (edema).   Yes [provider]  nitroGLYCERIN (NITROSTAT) 0.4 MG SL tablet Place 1 tablet (0.4 mg total) under the tongue every 5 (five) minutes as needed for chest pain. 02/14/19  Yes Shirley Friar, PA-C  potassium chloride SA (KLOR-CON) 20 MEQ tablet Take 40 mEq by mouth daily.   Yes [provider]  spironolactone (ALDACTONE) 25 MG tablet Take 0.5 tablets (12.5 mg total) by mouth daily. 03/01/21  Yes Clegg, Amy D, NP  torsemide (DEMADEX) 20 MG tablet TAKE 3 TABLETS BY MOUTH EVERY DAY Patient taking differently: Take 20 mg by mouth 3 (three) times daily. 10/03/21  Yes Shirley Friar, PA-C  ALPRAZolam Duanne Moron) 0.25 MG tablet Take 1 tablet (0.25 mg total) by mouth daily as needed for anxiety. Patient not taking: Reported on 12/10/2021 02/24/19   Shirley Friar, PA-C  blood glucose  meter kit and supplies KIT Dispense based on patient and insurance preference. Use up to four times daily as directed. 04/11/21   Bonnielee Haff, MD  predniSONE (DELTASONE) 10 MG tablet Take 4 tablets daily X 2 days, then, Take 3 tablets daily X 2 days, then, Take 2 tablets daily X 2 days, then, Take 1 tablets daily X 1 day. Patient not taking: Reported on 12/10/2021 10/01/21   Terrilee Croak, MD    Allergies    Codeine  Review of Systems   Review of Systems  Constitutional:  Negative for  fever.  Respiratory:  Positive for cough and shortness of breath.   Cardiovascular:  Positive for chest pain. Negative for leg swelling.  Gastrointestinal:  Positive for abdominal pain and vomiting. Negative for diarrhea.  Genitourinary:  Negative for dysuria, flank pain and hematuria.  Musculoskeletal:  Positive for back pain.  All other systems reviewed and are negative.  Physical Exam Updated Vital Signs BP 130/68    Pulse 81    Temp 99.3 F (37.4 C) (Oral)    Resp 15    SpO2 100%   Physical Exam Vitals and nursing note reviewed.  Constitutional:      Appearance: She is well-developed.  HENT:     Head: Normocephalic and atraumatic.     Right Ear: External ear normal.     Left Ear: External ear normal.     Nose: Nose normal.  Eyes:     General:        Right eye: No discharge.        Left eye: No discharge.  Cardiovascular:     Rate and Rhythm: Normal rate and regular rhythm.     Heart sounds: Normal heart sounds.  Pulmonary:     Effort: Pulmonary effort is normal. No tachypnea, accessory muscle usage or respiratory distress.     Breath sounds: Rales (mild, bases) present.  Abdominal:     Palpations: Abdomen is soft.     Tenderness: There is abdominal tenderness in the right upper quadrant and right lower quadrant. There is right CVA tenderness. There is no left CVA tenderness.  Musculoskeletal:     Right lower leg: No edema.     Left lower leg: No edema.  Skin:    General: Skin is  warm and dry.  Neurological:     Mental Status: She is alert.  Psychiatric:        Mood and Affect: Mood is not anxious.    ED Results / Procedures / Treatments   Labs (all labs ordered are listed, but only abnormal results are displayed) Labs Reviewed  CBC WITH DIFFERENTIAL/PLATELET - Abnormal; Notable for the following components:      Result Value   RBC 5.14 (*)    Hemoglobin 16.0 (*)    HCT 48.4 (*)    RDW 21.2 (*)    Neutro Abs 8.8 (*)    All other components within normal limits  COMPREHENSIVE METABOLIC PANEL - Abnormal; Notable for the following components:   Potassium 3.4 (*)    Glucose, Bld 151 (*)    Creatinine, Ser 1.19 (*)    AST 69 (*)    Total Bilirubin 1.5 (*)    GFR, Estimated 52 (*)    All other components within normal limits  URINALYSIS, ROUTINE W REFLEX MICROSCOPIC - Abnormal; Notable for the following components:   APPearance HAZY (*)    Hgb urine dipstick SMALL (*)    Ketones, ur 5 (*)    Protein, ur 100 (*)    Leukocytes,Ua LARGE (*)    All other components within normal limits  BRAIN NATRIURETIC PEPTIDE - Abnormal; Notable for the following components:   B Natriuretic Peptide 2,294.6 (*)    All other components within normal limits  TROPONIN I (HIGH SENSITIVITY) - Abnormal; Notable for the following components:   Troponin I (High Sensitivity) 29 (*)    All other components within normal limits  TROPONIN I (HIGH SENSITIVITY) - Abnormal; Notable for the following components:   Troponin I (High Sensitivity) 30 (*)  All other components within normal limits  RESP PANEL BY RT-PCR (FLU A&B, COVID) ARPGX2  URINE CULTURE  LACTIC ACID, PLASMA  LACTIC ACID, PLASMA  LIPASE, BLOOD  MAGNESIUM  COMPREHENSIVE METABOLIC PANEL  CBC  PROTIME-INR  HEPARIN LEVEL (UNFRACTIONATED)    EKG EKG Interpretation  Date/Time:  Saturday December 10 2021 14:49:23 EST Ventricular Rate:  81 PR Interval:  158 QRS Duration: 104 QT Interval:  452 QTC  Calculation: 525 R Axis:   253 Text Interpretation: Normal sinus rhythm Biatrial enlargement Right superior axis deviation Pulmonary disease pattern Left ventricular hypertrophy ( Cornell product ) Prolonged QT  similar to Oct 2022 Confirmed by Sherwood Gambler [phone]) on 12/10/2021 4:52:04 PM  Radiology DG Chest 2 View  Result Date: 12/10/2021 CLINICAL DATA:  Cough and shortness of breath. EXAM: CHEST - 2 VIEW COMPARISON:  September 28, 2021 FINDINGS: The heart size and mediastinal contours are stable. Heart size is enlarged. Cardiac pacemaker is unchanged. Small right pleural effusion is identified unchanged compared prior exam. There is no focal pneumonia. Minimal scarring of the left lung base is unchanged. The visualized skeletal structures are unremarkable. IMPRESSION: Small right pleural effusion unchanged compared prior exam. No focal pneumonia or pulmonary edema. Electronically Signed   By: Abelardo Diesel M.D.   On: 12/10/2021 15:29   CT ABDOMEN PELVIS W CONTRAST  Result Date: 12/10/2021 CLINICAL DATA:  Right lower quadrant abdominal pain. Nausea vomiting. Productive cough and chills. EXAM: CT ABDOMEN AND PELVIS WITH CONTRAST TECHNIQUE: Multidetector CT imaging of the abdomen and pelvis was performed using the standard protocol following bolus administration of intravenous contrast. CONTRAST:  65m ISOVUE-370 IOPAMIDOL (ISOVUE-370) INJECTION 76% COMPARISON:  01/27/2020. FINDINGS: Lower chest: No acute findings. Stable appearance from the prior CT. Hepatobiliary: Liver normal in size and overall attenuation. No liver mass or focal lesion. Gallbladder unremarkable. No bile duct dilation. Pancreas: Unremarkable. No pancreatic ductal dilatation or surrounding inflammatory changes. Spleen: Normal in size without focal abnormality. Adrenals/Urinary Tract: No adrenal masses. Focal hypoattenuation/lack of enhancement along the posterior aspect of the left kidney from the upper to the mid to lower pole, new  from the prior CT. This is likely an area of interval renal infarction. No defined renal masses. No stones. No hydronephrosis. Ureters are normal in course and in caliber. Bladder is unremarkable. Stomach/Bowel: Unremarkable stomach. Small bowel and colon are normal in caliber. No wall thickening. No inflammation. Normal appendix visualized. Vascular/Lymphatic: Aortic atherosclerosis. No aneurysm. No enlarged lymph nodes. Reproductive: Status post hysterectomy. No adnexal masses. Other: No abdominal wall hernia or abnormality. No abdominopelvic ascites. Musculoskeletal: No acute or significant osseous findings. IMPRESSION: 1. Focal hypo attenuation and lack of enhancement along the posterior aspect of the right kidney, new from the prior CT, consistent with an area of interval infarction. This does not appear acute. 2. No convincing acute abnormality within the abdomen or pelvis. 3. No renal or ureteral stones or obstructive uropathy. 4. Aortic atherosclerosis. 5. Electronically Signed   By: DLajean ManesM.D.   On: 12/10/2021 19:50    Procedures .Critical Care Performed by: GSherwood Gambler MD Authorized by: GSherwood Gambler MD   Critical care provider statement:    Critical care time (minutes):  30   Critical care time was exclusive of:  Separately billable procedures and treating other patients   Critical care was necessary to treat or prevent imminent or life-threatening deterioration of the following conditions:  Circulatory failure   Critical care was time spent personally by me on the following  activities:  Development of treatment plan with patient or surrogate, discussions with consultants, evaluation of patient's response to treatment, examination of patient, ordering and review of laboratory studies, ordering and review of radiographic studies, ordering and performing treatments and interventions, pulse oximetry, re-evaluation of patient's condition and review of old charts   Medications  Ordered in ED Medications  cefTRIAXone (ROCEPHIN) 1 g in sodium chloride 0.9 % 100 mL IVPB (1 g Intravenous New Bag/Given 12/10/21 2132)  furosemide (LASIX) injection 80 mg (has no administration in time range)  atorvastatin (LIPITOR) tablet 80 mg (has no administration in time range)  ivabradine (CORLANOR) tablet 7.5 mg (has no administration in time range)  nitroGLYCERIN (NITROSTAT) SL tablet 0.4 mg (has no administration in time range)  spironolactone (ALDACTONE) tablet 12.5 mg (has no administration in time range)  dapagliflozin propanediol (FARXIGA) tablet 10 mg (has no administration in time range)  ipratropium-albuterol (DUONEB) 0.5-2.5 (3) MG/3ML nebulizer solution 3 mL (has no administration in time range)  furosemide (LASIX) injection 80 mg (has no administration in time range)  sodium chloride flush (NS) 0.9 % injection 3 mL (has no administration in time range)  acetaminophen (TYLENOL) tablet 650 mg (has no administration in time range)    Or  acetaminophen (TYLENOL) suppository 650 mg (has no administration in time range)  polyethylene glycol (MIRALAX / GLYCOLAX) packet 17 g (has no administration in time range)  HYDROcodone-acetaminophen (NORCO/VICODIN) 5-325 MG per tablet 1 tablet (has no administration in time range)  HYDROmorphone (DILAUDID) injection 0.5 mg (has no administration in time range)  insulin aspart (novoLOG) injection 0-9 Units (has no administration in time range)  heparin ADULT infusion 100 units/mL (25000 units/256m) (has no administration in time range)  heparin bolus via infusion 3,500 Units (has no administration in time range)  fentaNYL (SUBLIMAZE) injection 50 mcg (50 mcg Intravenous Given 12/10/21 1724)  potassium chloride SA (KLOR-CON M) CR tablet 40 mEq (40 mEq Oral Given 12/10/21 1724)  iopamidol (ISOVUE-370) 76 % injection 75 mL (60 mLs Intravenous Contrast Given 12/10/21 1925)    ED Course  I have reviewed the triage vital signs and the nursing  notes.  Pertinent labs & imaging results that were available during my care of the patient were reviewed by me and considered in my medical decision making (see chart for details).    MDM Rules/Calculators/A&P                         Patient appears to have 2 issues tonight. One is an acute on chronic CHF exacerbation. I have reviewed her most recent echocardiogram which shows EF of less than 20%.  Her BNP is more elevated than baseline and she is complaining of a feeling of increased fluid.  She will be given IV Lasix.  She does not have any specific urinary symptoms but her urinalysis is concerning for UTI.  I have reviewed the CT images and agree with radiology that it appears that she has an acute right renal infarct.  Radiology feels like this is not acute but with her acute symptoms I suspect that it might be.  Discussed with hospitalist, Dr. MTrilby Drummer and we will start heparin and give a dose of IV furosemide.  Her troponin is slightly elevated compared to baseline but is flat and I suspect is related to CHF rather than ACS.  WBC is normal.  Hemoglobin stable.  Slight hypokalemia but this is recurrent for her and likely from diuretic use.  Overall, she will need admission and is otherwise hemodynamically stable.    Final Clinical Impression(s) / ED Diagnoses Final diagnoses:  Renal infarct ([place])  Acute on chronic systolic congestive heart failure [facility])    Rx / DC Orders ED Discharge Orders     None        Sherwood Gambler, MD 12/10/21 2201

## 2021-12-10 NOTE — ED Provider Notes (Signed)
Emergency Medicine Provider Triage Evaluation Note  Brittany [name] , a 60 y.o. female  was evaluated in triage.  Pt arrives via EMS, complaining of shortness of breath, cough, nausea and vomiting.  Patient reports she started feeling poorly yesterday.  Cough has been productive and with it she has some pain in her right side and abdomen.  Patient reports feeling short of breath, at baseline she wears oxygen at home as needed but her home oxygen machine broke yesterday, sats good with EMS.  Reports she has not been able to keep anything down and cannot take her home medications this morning, has had persistent vomiting and reports diffuse right-sided abdominal pain.  Review of Systems  Positive: Cough, nausea, vomiting, shortness of breath, abdominal pain Negative: Fevers, chest pain, diarrhea  Physical Exam  BP 133/68 (BP Location: Right Arm)    Pulse 80    Temp 99.3 F (37.4 C) (Oral)    Resp 17    SpO2 92%  Gen:   Awake, ill-appearing Resp:  Satting at 92% on room air, normal work of breathing, scattered rhonchi noted bilaterally Abdomen: Diffuse right-sided abdominal tenderness MSK:   Moves extremities without difficulty  Other:    Medical Decision Making  Medically screening exam initiated at 2:44 PM.  Appropriate orders placed.  Brittany [name] was informed that the remainder of the evaluation will be completed by another provider, this initial triage assessment does not replace that evaluation, and the importance of remaining in the ED until their evaluation is complete.  Patient is ill-appearing, has productive cough in the room but also has diffuse right-sided abdominal tenderness.  Will order abdominal labs, lactic acid, troponin, chest x-ray and EKG.  We will hold off on abdominal imaging as patient may ultimately need CT imaging of both the chest and abdomen.   Brittany Larsen, PA-C 12/10/21 1457    Brittany Rank, MD 12/10/21 [phone]

## 2021-12-10 NOTE — Progress Notes (Signed)
ANTICOAGULATION CONSULT NOTE - Initial Consult  Pharmacy Consult for Heparin Indication:  renal infarct  Allergies  Allergen Reactions   Codeine Nausea And Vomiting    Patient Measurements:   Heparin Dosing Weight: 46.8 kg  Vital Signs: Temp: 99.3 F (37.4 C) (12/31 1446) Temp Source: Oral (12/31 1446) BP: 130/68 (12/31 1815) Pulse Rate: 81 (12/31 1815)  Labs: Recent Labs    12/10/21 1456 12/10/21 1650  HGB 16.0*  --   HCT 48.4*  --   PLT 170  --   CREATININE 1.19*  --   TROPONINIHS 29* 30*    CrCl cannot be calculated (Unknown ideal weight.).   Medical History: Past Medical History:  Diagnosis Date   Acute respiratory failure ([place]) 04/07/7680   Acute systolic congestive heart failure ([place]) 02/03/2018   Allergy    Anxiety    Asthma    DM2 (diabetes mellitus, type 2) ([place]) 10/20/2020   Hypertension    PAF (paroxysmal atrial fibrillation) (HCC)    Prophylactic measure 08/03/14-08/19/14   Prophyl. 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   LIMA to LAD, SVG to D1, SVG to OM1, EVH via right thigh with implantation of Impella LD LVAD via direct aortic approach   Small cell lung cancer ([place]) 03/16/2014    Medications:  (Not in a hospital admission)  Scheduled:   [START ON 12/11/2021] atorvastatin  80 mg Oral q1800   [START ON 12/11/2021] dapagliflozin propanediol  10 mg Oral QAC breakfast   furosemide  80 mg Intravenous Once   [START ON 12/11/2021] furosemide  80 mg Intravenous BID   [START ON 12/11/2021] ivabradine  7.5 mg Oral BID WC   sodium chloride flush  3 mL Intravenous Q12H   [START ON 12/11/2021] spironolactone  12.5 mg Oral Daily   Infusions:   cefTRIAXone (ROCEPHIN)  IV     PRN: acetaminophen **OR** acetaminophen, HYDROcodone-acetaminophen, HYDROmorphone (DILAUDID) injection, ipratropium-albuterol, nitroGLYCERIN, polyethylene glycol  Assessment: 47 yof with a history of COPD/asthma, CHF, CKD 3A, diabetes, hyperlipidemia, atrial  fibrillation, anxiety, hypertension, CAD status post CABG, small cell lung cancer . Patient is presenting with AMS, SOB, abdominal and back pain. Heparin per pharmacy consult placed for  renal infarct .  Patient is not on anticoagulation prior to arrival.  Hgb 16; plt 170  Goal of Therapy:  Heparin level 0.3-0.7 units/ml Monitor platelets by anticoagulation protocol: Yes   Plan:  Give 3500 units bolus x 1 Start heparin infusion at 850 units/hr Check anti-Xa level in 6-8 hours and daily while on heparin Continue to monitor H&H and platelets  Lorelei Pont, PharmD, BCPS 12/10/2021 9:05 PM ED Clinical Pharmacist -  [phone]

## 2021-12-10 NOTE — H&P (Signed)
History and Physical   Brittany [name] UYQ:[id number] DOB: [birth year] DOA: 12/10/2021  PCP: Maude Leriche, PA-C   Patient coming from: Home  Chief Complaint: Shortness of breath, cough, abdominal and back pain  HPI: Brittany [name] is a 60 y.o. female with medical history significant of COPD/asthma, CHF, CKD 3A, diabetes, hyperlipidemia, atrial fibrillation, anxiety, hypertension, CAD status post CABG, small cell lung cancer presenting with altered symptoms including shortness of breath, cough, abdominal and back pain.  Patient states that she has had symptoms for the past couple of days consisting of chest pain and shortness of breath similar to previous CHF exacerbations.  She denies any edema but states for her exacerbation she does not typically get edema.  She also reports around the same time some back pain and abdominal pain and has had some nausea and vomiting as well.  Her vomiting has prevented her from taking her medications today.  Her back pain is a 10 out of 10 flank pain on the right.  She denies fevers, chills, constipation, diarrhea.  ED Course: Vital signs in the ED were stable.  Lab work-up showed CMP with potassium 3.4, creatinine stable 1.19, glucose 151, AST mildly elevated at 69 and T bili 1.5.  CBC stable with hemoglobin of 16.  Troponin flat at 29 and 30 on repeat.  Lactic acid normal x2.  Lipase normal.  Respiratory panel flu COVID-negative.  BNP elevated at 2294.  Urinalysis with hemoglobin protein and large leukocytes but no bacteria.  Chest x-ray showing small right pleural effusion which appears stable.  CT of the abdomen pelvis showed focal hypoattenuation of the right kidney consistent with infarction that does not appear to be acute.  Otherwise no acute abnormality on the CT abdomen pelvis.  Patient was started on a dose of ceftriaxone, dose of fentanyl and 40 mEq potassium in the ED.  ED provider also is starting heparin and a dose of Lasix after  our discussion.  Review of Systems: As per HPI otherwise all other systems reviewed and are negative.  Past Medical History:  Diagnosis Date   Acute respiratory failure ([place]) 6/38/7564   Acute systolic congestive heart failure ([place]) 02/03/2018   Allergy    Anxiety    Asthma    DM2 (diabetes mellitus, type 2) ([place]) 10/20/2020   Hypertension    PAF (paroxysmal atrial fibrillation) (HCC)    Prophylactic measure 08/03/14-08/19/14   Prophyl. 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   LIMA to LAD, SVG to D1, SVG to OM1, EVH via right thigh with implantation of Impella LD LVAD via direct aortic approach   Small cell lung cancer ([place]) 03/16/2014    Past Surgical History:  Procedure Laterality Date   BRONCHIAL BRUSHINGS  10/25/2020   Procedure: BRONCHIAL BRUSHINGS;  Surgeon: Collene Gobble, MD;  Location: [facility] ENDOSCOPY;  Service: Pulmonary;;   BRONCHIAL NEEDLE ASPIRATION BIOPSY  10/25/2020   Procedure: BRONCHIAL NEEDLE ASPIRATION BIOPSIES;  Surgeon: Collene Gobble, MD;  Location: [place] ENDOSCOPY;  Service: Pulmonary;;   CARDIAC DEFIBRILLATOR PLACEMENT  08/15/2018   MDT Visia AF MRI VR ICD implanted by Dr Loel Lofty for primary prevention of sudden   CESAREAN SECTION     CORONARY ARTERY BYPASS GRAFT N/A 02/03/2018   Procedure: CORONARY ARTERY BYPASS GRAFTING (CABG);  Surgeon: Rexene Alberts, MD;  Location: [place];  Service: Open Heart Surgery;  Laterality: N/A;  Time 3 using left internal mammary artery and endoscopically harvested right saphenous vein  CORONARY BALLOON ANGIOPLASTY N/A 02/03/2018   Procedure: CORONARY BALLOON ANGIOPLASTY;  Surgeon: Martinique, Peter M, MD;  Location: [place] CV LAB;  Service: Cardiovascular;  Laterality: N/A;   CORONARY STENT INTERVENTION N/A 02/12/2019   Procedure: CORONARY STENT INTERVENTION;  Surgeon: Wellington Hampshire, MD;  Location: [place] CV LAB;  Service: Cardiovascular;  Laterality: N/A;   CORONARY/GRAFT ACUTE MI REVASCULARIZATION N/A  02/03/2018   Procedure: Coronary/Graft Acute MI Revascularization;  Surgeon: Martinique, Peter M, MD;  Location: [place] CV LAB;  Service: Cardiovascular;  Laterality: N/A;   ENDOBRONCHIAL ULTRASOUND N/A 10/25/2020   Procedure: ENDOBRONCHIAL ULTRASOUND;  Surgeon: Collene Gobble, MD;  Location[facility] ENDOSCOPY;  Service: Pulmonary;  Laterality: N/A;   FLEXIBLE BRONCHOSCOPY  10/25/2020   Procedure: FLEXIBLE BRONCHOSCOPY;  Surgeon: Collene Gobble, MD;  Location: [facility] ENDOSCOPY;  Service: Pulmonary;;   IABP INSERTION N/A 02/03/2018   Procedure: IABP Insertion;  Surgeon: Martinique, Peter M, MD;  Location: [place] CV LAB;  Service: Cardiovascular;  Laterality: N/A;   INTRAOPERATIVE TRANSESOPHAGEAL ECHOCARDIOGRAM N/A 02/03/2018   Procedure: INTRAOPERATIVE TRANSESOPHAGEAL ECHOCARDIOGRAM;  Surgeon: Rexene Alberts, MD;  Location: [place];  Service: Open Heart Surgery;  Laterality: N/A;   LEFT HEART CATH AND CORONARY ANGIOGRAPHY N/A 02/03/2018   Procedure: LEFT HEART CATH AND CORONARY ANGIOGRAPHY;  Surgeon: Martinique, Peter M, MD;  Location: [place] CV LAB;  Service: Cardiovascular;  Laterality: N/A;   LEFT HEART CATH AND CORS/GRAFTS ANGIOGRAPHY N/A 02/12/2019   Procedure: LEFT HEART CATH AND CORS/GRAFTS ANGIOGRAPHY;  Surgeon: Wellington Hampshire, MD;  Location: [place] CV LAB;  Service: Cardiovascular;  Laterality: N/A;   MEDIASTINOSCOPY N/A 03/11/2014   Procedure: MEDIASTINOSCOPY;  Surgeon: Melrose Nakayama, MD;  Location: [place];  Service: Thoracic;  Laterality: N/A;   PLACEMENT OF IMPELLA LEFT VENTRICULAR ASSIST DEVICE  02/03/2018   Procedure: PLACEMENT OF [place] LEFT VENTRICULAR ASSIST DEVICE LD;  Surgeon: Rexene Alberts, MD;  Location: [place];  Service: Open Heart Surgery;;   REMOVAL OF [place] LEFT VENTRICULAR ASSIST DEVICE N/A 02/08/2018   Procedure: REMOVAL OF [place] LEFT VENTRICULAR ASSIST DEVICE;  Surgeon: Rexene Alberts, MD;  Location: [place];  Service: Open Heart Surgery;  Laterality: N/A;   RIGHT  HEART CATH N/A 02/03/2018   Procedure: RIGHT HEART CATH;  Surgeon: Martinique, Peter M, MD;  Location: [place] CV LAB;  Service: Cardiovascular;  Laterality: N/A;   RIGHT HEART CATH N/A 05/09/2018   Procedure: RIGHT HEART CATH;  Surgeon: Jolaine Artist, MD;  Location: [place] CV LAB;  Service: Cardiovascular;  Laterality: N/A;   TEE WITHOUT CARDIOVERSION N/A 02/08/2018   Procedure: TRANSESOPHAGEAL ECHOCARDIOGRAM (TEE);  Surgeon: Rexene Alberts, MD;  Location: [place];  Service: Open Heart Surgery;  Laterality: N/A;   TUBAL LIGATION     VIDEO BRONCHOSCOPY WITH ENDOBRONCHIAL ULTRASOUND N/A 03/11/2014   Procedure: VIDEO BRONCHOSCOPY WITH ENDOBRONCHIAL ULTRASOUND;  Surgeon: Melrose Nakayama, MD;  Location: [place];  Service: Thoracic;  Laterality: N/A;    Social History  reports that she quit smoking about 7 years ago. Her smoking use included cigarettes. She has a 20.00 pack-year smoking history. She has never used smokeless tobacco. She reports current alcohol use of about 8.0 standard drinks per week. She reports current drug use. Drug: Marijuana.  Allergies  Allergen Reactions   Codeine Nausea And Vomiting    Family History  Problem Relation Age of Onset   Heart disease Mother    Hypertension Mother  Heart attack Mother    Hypertension Maternal Grandmother    Cancer Maternal Grandmother    Diabetes Paternal Grandmother    Stroke Neg Hx   Reviewed on admission  Prior to Admission medications   Medication Sig Start Date End Date Taking? Authorizing Provider  albuterol (VENTOLIN HFA) 108 (90 Base) MCG/ACT inhaler Inhale 2 puffs into the lungs every 6 (six) hours as needed for wheezing or shortness of breath. 04/16/19   Bensimhon, Shaune Pascal, MD  ALPRAZolam Duanne Moron) 0.25 MG tablet Take 1 tablet (0.25 mg total) by mouth daily as needed for anxiety. 02/24/19   Shirley Friar, PA-C  aspirin EC 81 MG tablet Take 1 tablet (81 mg total) by mouth daily. Swallow whole. 11/10/20    Bensimhon, Shaune Pascal, MD  atorvastatin (LIPITOR) 80 MG tablet Take 1 tablet (80 mg total) by mouth daily at 6 PM. 07/03/18   Bensimhon, Shaune Pascal, MD  blood glucose meter kit and supplies KIT Dispense based on patient and insurance preference. Use up to four times daily as directed. 04/11/21   Bonnielee Haff, MD  CORLANOR 7.5 MG TABS tablet TAKE 1 TABLET (7.5 MG TOTAL) BY MOUTH 2 (TWO) TIMES DAILY WITH A MEAL. 10/10/21   Bensimhon, Shaune Pascal, MD  FARXIGA 10 MG TABS tablet TAKE 1 TABLET (10 MG TOTAL) BY MOUTH DAILY BEFORE BREAKFAST. 08/16/21   Bensimhon, Shaune Pascal, MD  Glucerna (GLUCERNA) LIQD Take 237 mLs by mouth daily in the afternoon.    [provider]  ipratropium-albuterol (DUONEB) 0.5-2.5 (3) MG/3ML SOLN Take 3 mLs by nebulization 2 (two) times daily as needed (for shortness of breath).    [provider]  metolazone (ZAROXOLYN) 2.5 MG tablet Take 2.5 mg by mouth daily as needed (edema).    [provider]  nitroGLYCERIN (NITROSTAT) 0.4 MG SL tablet Place 1 tablet (0.4 mg total) under the tongue every 5 (five) minutes as needed for chest pain. 02/14/19   Shirley Friar, PA-C  potassium chloride SA (KLOR-CON) 20 MEQ tablet Take 40 mEq by mouth daily.    [provider]  predniSONE (DELTASONE) 10 MG tablet Take 4 tablets daily X 2 days, then, Take 3 tablets daily X 2 days, then, Take 2 tablets daily X 2 days, then, Take 1 tablets daily X 1 day. 10/01/21   Terrilee Croak, MD  spironolactone (ALDACTONE) 25 MG tablet Take 0.5 tablets (12.5 mg total) by mouth daily. 03/01/21   Darrick Grinder D, NP  torsemide (DEMADEX) 20 MG tablet TAKE 3 TABLETS BY MOUTH EVERY DAY 10/03/21   Shirley Friar, PA-C    Physical Exam: Vitals:   12/10/21 1446 12/10/21 1715 12/10/21 1730 12/10/21 1815  BP: 133/68 131/71 135/69 130/68  Pulse: 80 84 88 81  Resp: _0 Temp: 99.3 F (37.4 C)     TempSrc: Oral     SpO2: 92% 95% 95% 100%   Physical Exam Constitutional:       General: She is not in acute distress.    Appearance: Normal appearance.  HENT:     Head: Normocephalic and atraumatic.     Mouth/Throat:     Mouth: Mucous membranes are moist.     Pharynx: Oropharynx is clear.  Eyes:     Extraocular Movements: Extraocular movements intact.     Pupils: Pupils are equal, round, and reactive to light.  Cardiovascular:     Rate and Rhythm: Normal rate and regular rhythm.     Pulses: Normal pulses.  Heart sounds: Normal heart sounds.  Pulmonary:     Effort: Pulmonary effort is normal. No respiratory distress.     Breath sounds: Examination of the right-lower field reveals rales. Examination of the left-lower field reveals rales. Rales present.  Abdominal:     General: Bowel sounds are normal. There is no distension.     Palpations: Abdomen is soft.     Tenderness: There is no abdominal tenderness.  Musculoskeletal:        General: No swelling or deformity.  Skin:    General: Skin is warm and dry.  Neurological:     General: No focal deficit present.     Mental Status: Mental status is at baseline.   Labs on Admission: I have personally reviewed following labs and imaging studies  CBC: Recent Labs  Lab 12/10/21 1456  WBC 10.1  NEUTROABS 8.8*  HGB 16.0*  HCT 48.4*  MCV 94.2  PLT 428    Basic Metabolic Panel: Recent Labs  Lab 12/10/21 1456  NA 138  K 3.4*  CL 102  CO2 26  GLUCOSE 151*  BUN 15  CREATININE 1.19*  CALCIUM 9.9    GFR: CrCl cannot be calculated (Unknown ideal weight.).  Liver Function Tests: Recent Labs  Lab 12/10/21 1456  AST 69*  ALT 33  ALKPHOS 87  BILITOT 1.5*  PROT 7.3  ALBUMIN 4.1    Urine analysis:    Component Value Date/Time   COLORURINE YELLOW 12/10/2021 1849   APPEARANCEUR HAZY (A) 12/10/2021 1849   LABSPEC 1.014 12/10/2021 1849   PHURINE 6.0 12/10/2021 1849   GLUCOSEU NEGATIVE 12/10/2021 1849   HGBUR SMALL (A) 12/10/2021 1849   BILIRUBINUR NEGATIVE 12/10/2021 1849   KETONESUR 5  (A) 12/10/2021 1849   PROTEINUR 100 (A) 12/10/2021 1849   UROBILINOGEN 1.0 03/15/2015 1117   NITRITE NEGATIVE 12/10/2021 1849   LEUKOCYTESUR LARGE (A) 12/10/2021 1849    Radiological Exams on Admission: DG Chest 2 View  Result Date: 12/10/2021 CLINICAL DATA:  Cough and shortness of breath. EXAM: CHEST - 2 VIEW COMPARISON:  September 28, 2021 FINDINGS: The heart size and mediastinal contours are stable. Heart size is enlarged. Cardiac pacemaker is unchanged. Small right pleural effusion is identified unchanged compared prior exam. There is no focal pneumonia. Minimal scarring of the left lung base is unchanged. The visualized skeletal structures are unremarkable. IMPRESSION: Small right pleural effusion unchanged compared prior exam. No focal pneumonia or pulmonary edema. Electronically Signed   By: Abelardo Diesel M.D.   On: 12/10/2021 15:29   CT ABDOMEN PELVIS W CONTRAST  Result Date: 12/10/2021 CLINICAL DATA:  Right lower quadrant abdominal pain. Nausea vomiting. Productive cough and chills. EXAM: CT ABDOMEN AND PELVIS WITH CONTRAST TECHNIQUE: Multidetector CT imaging of the abdomen and pelvis was performed using the standard protocol following bolus administration of intravenous contrast. CONTRAST:  26m ISOVUE-370 IOPAMIDOL (ISOVUE-370) INJECTION 76% COMPARISON:  01/27/2020. FINDINGS: Lower chest: No acute findings. Stable appearance from the prior CT. Hepatobiliary: Liver normal in size and overall attenuation. No liver mass or focal lesion. Gallbladder unremarkable. No bile duct dilation. Pancreas: Unremarkable. No pancreatic ductal dilatation or surrounding inflammatory changes. Spleen: Normal in size without focal abnormality. Adrenals/Urinary Tract: No adrenal masses. Focal hypoattenuation/lack of enhancement along the posterior aspect of the left kidney from the upper to the mid to lower pole, new from the prior CT. This is likely an area of interval renal infarction. No defined renal masses.  No stones. No hydronephrosis. Ureters are normal in  course and in caliber. Bladder is unremarkable. Stomach/Bowel: Unremarkable stomach. Small bowel and colon are normal in caliber. No wall thickening. No inflammation. Normal appendix visualized. Vascular/Lymphatic: Aortic atherosclerosis. No aneurysm. No enlarged lymph nodes. Reproductive: Status post hysterectomy. No adnexal masses. Other: No abdominal wall hernia or abnormality. No abdominopelvic ascites. Musculoskeletal: No acute or significant osseous findings. IMPRESSION: 1. Focal hypo attenuation and lack of enhancement along the posterior aspect of the right kidney, new from the prior CT, consistent with an area of interval infarction. This does not appear acute. 2. No convincing acute abnormality within the abdomen or pelvis. 3. No renal or ureteral stones or obstructive uropathy. 4. Aortic atherosclerosis. 5. Electronically Signed   By: Lajean Manes M.D.   On: 12/10/2021 19:50    EKG: Independently reviewed.  Sinus rhythm at 81 bpm.  QTc 525.  Evidence of LVH.  Assessment/Plan Principal Problem:   CHF exacerbation (HCC) Active Problems:   Essential hypertension   AF (paroxysmal atrial fibrillation) (HCC)   Small cell lung cancer (HCC)   Asthma   S/P CABG (coronary artery bypass graft)   ICD (implantable cardioverter-defibrillator) in place   Type 2 diabetes mellitus with stage 3a chronic kidney disease, without long-term current use of insulin (HCC)   Mixed diabetic hyperlipidemia associated with type 2 diabetes mellitus ([place])   Chronic kidney disease, stage 3a ([place])   Renal infarct (HCC)  CHF exacerbation > Last echo in March of this year with EF less than 20% and grade 2 diastolic dysfunction with normal RV function. > Patient having some shortness of breath and cough typical of her CHF exacerbation.  BNP elevated to greater than 2200 in the ED.  Chest x-ray appears stable.  No edema but she does not typically get edema with her  exacerbations. - Monitor on telemetry - Lasix 80 mg IV twice daily - Trend renal functions and electrolytes - Strict INO's/daily weights - Continue home spironolactone, ivabradine, Farxiga   Renal infarct > Presenting with flank pain and CT showing right renal infarct. > Most likely etiology is known A. fib not currently on a blood thinner due to prior issues with bleeding. > Will be checking echocardiogram for CHF this will evaluate for any residual clot. > Did receive a dose of ceftriaxone in the ED with an possibility of UTI was concerning.  Urinalysis does have leukocytes, but no bacteria and no urinary symptoms.  Favor the renal infarct as explanation for symptoms. - IV heparin for treatment and prevention for now as risk-benefit of anticoagulation is weighed in the setting of this infarct - Follow-up echocardiogram as above - Pain control as needed, Norco for moderate to severe pain.  Dilaudid for breakthrough.  Atrial fibrillation > Previously not on anticoagulation due to issues with bleeding in the past.  We will be reconsidering this in the setting of infarction as above. - Continue with anticoagulation as above - May be reasonable to consult cardiology for input considering she follows with advanced heart failure team  CAD > History of CABG.  Troponin stable and flat in the ED at 29 and 30 on repeat. - Continue home aspirin and statin  Hypertension - Continue spironolactone  Hyperlipidemia - Continue home statin  CKD 3A > Creatinine stable at 1.19 in the ED - Avoid nephrotoxic agents - Trend renal function and electrolytes  COPD/asthma - Continue home as needed DuoNebs   Diabetes - Farxiga as above - SSI   DVT prophylaxis: Heparin  Code Status:  Full  Family Communication:  None on admission Disposition Plan:   Patient is from:  Home  Anticipated DC to:  Home  Anticipated DC date:  1 to 3 days  Anticipated DC barriers: None  Consults called:  None   Admission status:  Observation, telemetry   Severity of Illness: The appropriate patient status for this patient is OBSERVATION. Observation status is judged to be reasonable and necessary in order to provide the required intensity of service to ensure the patient's safety. The patient's presenting symptoms, physical exam findings, and initial radiographic and laboratory data in the context of their medical condition is felt to place them at decreased risk for further clinical deterioration. Furthermore, it is anticipated that the patient will be medically stable for discharge from the hospital within 2 midnights of admission.     Marcelyn Bruins MD [facility]  How to contact the [facility] Attending or Consulting provider [place] or covering provider during after hours [place], for this patient?   Check the care team in [facility] and look for a) attending/consulting [facility] provider listed and b) the [facility] team listed Log into [email or web address] and use [place]'s universal password to access. If you do not have the password, please contact the hospital operator. Locate the [facility] provider you are looking for under [facility] and page to a number that you can be directly reached. If you still have difficulty reaching the provider, please page the [facility] (Director on Call) for the Hospitalists listed on amion for assistance.  12/10/2021, 8:43 PM

## 2021-12-11 ENCOUNTER — Observation Stay (HOSPITAL_COMMUNITY): Payer: BC Managed Care – PPO

## 2021-12-11 DIAGNOSIS — E1122 Type 2 diabetes mellitus with diabetic chronic kidney disease: Secondary | ICD-10-CM | POA: Diagnosis present

## 2021-12-11 DIAGNOSIS — D72829 Elevated white blood cell count, unspecified: Secondary | ICD-10-CM | POA: Diagnosis present

## 2021-12-11 DIAGNOSIS — I10 Essential (primary) hypertension: Secondary | ICD-10-CM

## 2021-12-11 DIAGNOSIS — N280 Ischemia and infarction of kidney: Secondary | ICD-10-CM | POA: Diagnosis present

## 2021-12-11 DIAGNOSIS — I5022 Chronic systolic (congestive) heart failure: Secondary | ICD-10-CM | POA: Insufficient documentation

## 2021-12-11 DIAGNOSIS — J439 Emphysema, unspecified: Secondary | ICD-10-CM | POA: Diagnosis present

## 2021-12-11 DIAGNOSIS — E876 Hypokalemia: Secondary | ICD-10-CM | POA: Diagnosis present

## 2021-12-11 DIAGNOSIS — R0602 Shortness of breath: Secondary | ICD-10-CM | POA: Diagnosis present

## 2021-12-11 DIAGNOSIS — Z923 Personal history of irradiation: Secondary | ICD-10-CM | POA: Diagnosis not present

## 2021-12-11 DIAGNOSIS — I480 Paroxysmal atrial fibrillation: Secondary | ICD-10-CM

## 2021-12-11 DIAGNOSIS — I5023 Acute on chronic systolic (congestive) heart failure: Secondary | ICD-10-CM

## 2021-12-11 DIAGNOSIS — E782 Mixed hyperlipidemia: Secondary | ICD-10-CM | POA: Diagnosis present

## 2021-12-11 DIAGNOSIS — E1169 Type 2 diabetes mellitus with other specified complication: Secondary | ICD-10-CM | POA: Diagnosis present

## 2021-12-11 DIAGNOSIS — Z8249 Family history of ischemic heart disease and other diseases of the circulatory system: Secondary | ICD-10-CM | POA: Diagnosis not present

## 2021-12-11 DIAGNOSIS — Z9221 Personal history of antineoplastic chemotherapy: Secondary | ICD-10-CM | POA: Diagnosis not present

## 2021-12-11 DIAGNOSIS — I351 Nonrheumatic aortic (valve) insufficiency: Secondary | ICD-10-CM | POA: Diagnosis present

## 2021-12-11 DIAGNOSIS — I255 Ischemic cardiomyopathy: Secondary | ICD-10-CM | POA: Diagnosis present

## 2021-12-11 DIAGNOSIS — Z20822 Contact with and (suspected) exposure to covid-19: Secondary | ICD-10-CM | POA: Diagnosis present

## 2021-12-11 DIAGNOSIS — J9611 Chronic respiratory failure with hypoxia: Secondary | ICD-10-CM | POA: Diagnosis present

## 2021-12-11 DIAGNOSIS — R3129 Other microscopic hematuria: Secondary | ICD-10-CM | POA: Diagnosis present

## 2021-12-11 DIAGNOSIS — Z95810 Presence of automatic (implantable) cardiac defibrillator: Secondary | ICD-10-CM

## 2021-12-11 DIAGNOSIS — Z85118 Personal history of other malignant neoplasm of bronchus and lung: Secondary | ICD-10-CM | POA: Diagnosis not present

## 2021-12-11 DIAGNOSIS — I130 Hypertensive heart and chronic kidney disease with heart failure and stage 1 through stage 4 chronic kidney disease, or unspecified chronic kidney disease: Secondary | ICD-10-CM | POA: Diagnosis present

## 2021-12-11 DIAGNOSIS — I252 Old myocardial infarction: Secondary | ICD-10-CM | POA: Diagnosis not present

## 2021-12-11 DIAGNOSIS — N179 Acute kidney failure, unspecified: Secondary | ICD-10-CM | POA: Diagnosis present

## 2021-12-11 DIAGNOSIS — I5043 Acute on chronic combined systolic (congestive) and diastolic (congestive) heart failure: Secondary | ICD-10-CM | POA: Diagnosis present

## 2021-12-11 DIAGNOSIS — N1831 Chronic kidney disease, stage 3a: Secondary | ICD-10-CM | POA: Diagnosis present

## 2021-12-11 DIAGNOSIS — I2510 Atherosclerotic heart disease of native coronary artery without angina pectoris: Secondary | ICD-10-CM | POA: Diagnosis present

## 2021-12-11 DIAGNOSIS — Z809 Family history of malignant neoplasm, unspecified: Secondary | ICD-10-CM | POA: Diagnosis not present

## 2021-12-11 LAB — ECHOCARDIOGRAM COMPLETE
AR max vel: 1.3 cm2
AV Area VTI: 1.08 cm2
AV Area mean vel: 1.13 cm2
AV Mean grad: 8 mmHg
AV Peak grad: 16.3 mmHg
Ao pk vel: 2.02 m/s
Area-P 1/2: 5.54 cm2
P 1/2 time: 311 msec
S' Lateral: 5.6 cm
Weight: 1494.4 oz

## 2021-12-11 LAB — CBC
HCT: 45.8 % (ref 36.0–46.0)
Hemoglobin: 15.1 g/dL — ABNORMAL HIGH (ref 12.0–15.0)
MCH: 31.3 pg (ref 26.0–34.0)
MCHC: 33 g/dL (ref 30.0–36.0)
MCV: 94.8 fL (ref 80.0–100.0)
Platelets: 156 10*3/uL (ref 150–400)
RBC: 4.83 MIL/uL (ref 3.87–5.11)
RDW: 21.2 % — ABNORMAL HIGH (ref 11.5–15.5)
WBC: 9.3 10*3/uL (ref 4.0–10.5)
nRBC: 0 % (ref 0.0–0.2)

## 2021-12-11 LAB — COMPREHENSIVE METABOLIC PANEL
ALT: 61 U/L — ABNORMAL HIGH (ref 0–44)
AST: 94 U/L — ABNORMAL HIGH (ref 15–41)
Albumin: 3.5 g/dL (ref 3.5–5.0)
Alkaline Phosphatase: 78 U/L (ref 38–126)
Anion gap: 5 (ref 5–15)
BUN: 12 mg/dL (ref 8–23)
CO2: 28 mmol/L (ref 22–32)
Calcium: 9 mg/dL (ref 8.9–10.3)
Chloride: 105 mmol/L (ref 98–111)
Creatinine, Ser: 1.18 mg/dL — ABNORMAL HIGH (ref 0.44–1.00)
GFR, Estimated: 53 mL/min — ABNORMAL LOW (ref >60)
Glucose, Bld: 108 mg/dL — ABNORMAL HIGH (ref 70–99)
Potassium: 3.6 mmol/L (ref 3.5–5.1)
Sodium: 138 mmol/L (ref 135–145)
Total Bilirubin: 1.4 mg/dL — ABNORMAL HIGH (ref 0.3–1.2)
Total Protein: 6.4 g/dL — ABNORMAL LOW (ref 6.5–8.1)

## 2021-12-11 LAB — GLUCOSE, CAPILLARY
Glucose-Capillary: 201 mg/dL — ABNORMAL HIGH (ref 70–99)
Glucose-Capillary: 185 mg/dL — ABNORMAL HIGH (ref 70–99)
Glucose-Capillary: 140 mg/dL — ABNORMAL HIGH (ref 70–99)

## 2021-12-11 LAB — HEPARIN LEVEL (UNFRACTIONATED)
Heparin Unfractionated: 0.55 IU/mL (ref 0.30–0.70)
Heparin Unfractionated: 0.4 IU/mL (ref 0.30–0.70)
Heparin Unfractionated: 0.34 IU/mL (ref 0.30–0.70)

## 2021-12-11 MED ORDER — ONDANSETRON HCL 4 MG/2ML IJ SOLN: 4.0000 mg | Freq: Four times a day (QID) | INTRAMUSCULAR | Status: DC | PRN

## 2021-12-11 MED ORDER — PERFLUTREN LIPID MICROSPHERE
1.0000 mL | INTRAVENOUS | Status: AC | PRN
  Administered 2021-12-11: 2 mL via INTRAVENOUS
  Filled 2021-12-11: qty 10

## 2021-12-11 MED ORDER — POTASSIUM CHLORIDE CRYS ER 20 MEQ PO TBCR
40.0000 meq | EXTENDED_RELEASE_TABLET | Freq: Two times a day (BID) | ORAL | Status: AC
  Administered 2021-12-11: 40 meq via ORAL
  Filled 2021-12-11 (×2): qty 2

## 2021-12-11 MED ORDER — METOLAZONE 5 MG PO TABS
2.5000 mg | ORAL_TABLET | Freq: Once | ORAL | Status: AC
  Administered 2021-12-11: 2.5 mg via ORAL
  Filled 2021-12-11: qty 1

## 2021-12-11 MED ORDER — DIAZEPAM 2 MG PO TABS
2.0000 mg | ORAL_TABLET | Freq: Two times a day (BID) | ORAL | Status: DC | PRN
  Administered 2021-12-11 – 2021-12-13 (×3): 2 mg via ORAL
  Filled 2021-12-11 (×3): qty 1

## 2021-12-11 NOTE — Assessment & Plan Note (Addendum)
Presented with right flank pain and CT abdomen/pelvis showing changed consistent with renal infarct.   1/3: right flank pain improving --Transitioned IV heparin >> Eliquis  --Pain control --Monitor renal function

## 2021-12-11 NOTE — Progress Notes (Signed)
Progress Note   Patient: Brittany [name] BDZ:[id number] DOB: Jun 18, 1961 DOA: 12/10/2021     0 DOS: the patient was seen and examined on 12/11/2021   Brief hospital course: Brittany [name] is a 61 y.o. 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 Evaluation in the ED revealed likely decompensated CHF with significantly elevated BNP 2294, chest xray with stable small right pleural effusion and reports of SOB/DOE.  CT abdomen/pelvis showed a right renal infarct but no other acute abnormalities.    Patient was given IV Lasix and started on heparin drip. Admitted to [facility] service with cardiology consulted.  Assessment and Plan * Acute on chronic combined systolic and diastolic CHF (congestive heart failure) ([place])- (present on admission) Last echo in March of this year with EF less than 20% and grade 2 diastolic dysfunction with normal RV function.  Multiple admissions for decompensations. Presented with shortness of breath and cough typical of her CHF exacerbation.  BNP elevated to greater than 2200 in the ED.  Chest x-ray stable.  right pleural effusion.  No edema, typically does not typically get edema with her CHF exacerbations. --Cardiology consulted Follows with advanced heart failure team. --Telemetry --Started on Lasix 80 mg IV BID, diuresis per cardiology --Monitor renal functions and electrolytes --Strict I/O's & daily weights --Continue home spironolactone, ivabradine, Farxiga  Renal infarct [facility]) Presented with right flank pain and CT abdomen/pelvis showing changed consistent with renal infarct.   --IV heparin --Transition  to Eliquis if tolerating --Pain control --Monitor renal function  AF (paroxysmal atrial fibrillation) ([place])- (present on admission) Not on anticoagulation due to history of hemoptysis. Currently in sinus rhythm. --Telemetry --On IV heparin due to renal infarct --Likely transition to Eliquis with close monitoring for bleeding complications if she tolerates heparin  Mixed diabetic hyperlipidemia associated with type 2 diabetes mellitus ([place])- (present on admission) Continue statin  ICD (implantable cardioverter-defibrillator) in place- (present on admission) Due to ischemic cardiomyopathy with EF < 20%.   S/P CABG (coronary artery bypass graft) CAD stable, no chest pain. Continue ASA, statin Not on BB due to intolerance and low output  Essential hypertension- (present on admission) Continue spironolactone  Chronic kidney disease, stage 3a ([place])- (present on admission) Renal function stable.  Monitor with diuresis.    Type 2 diabetes mellitus with stage 3a chronic kidney disease, without long-term current use of insulin ([place])- (present on admission) On Farxiga.  Sliding scale Novolog.  Asthma- (present on admission) Mild wheezing on exam but not overtly exacerbated. Continue Duonebs. Low threshold to start steroids if wheezing is persistent.    Small cell lung cancer ([place])- (present on admission) S/p chemo and XRT     Subjective: Pt reports ongoing right flank pain, but says it's better than it was.  She has a couple of spells of pain and winces during our encounter.  Otherwise reports being very thirsty and concerned for dehydration with diuretics.  No other acute complaints at this time.   Objective Vitals reviewed and notable for soft BP's but MAP maintained. On 4 L/min oxygen by [place]. Temp 99   Data Reviewed: Labs notable for CBG 201, fasting glucose 108, Cr stable 1.18, LFT's up today AST 94,  ALT 61, Tbili 1.4.  Hbg 15.1  Echo 12/11/21: Left ventricular ejection  fraction, by estimation, is 20 to 25%. Left ventricular ejection fraction by PLAX is 24 %. The left ventricle has severely decreased function. The left ventricle demonstrates global hypokinesis. The left ventricular internal   cavity size was moderately dilated. Left ventricular diastolic parameters are consistent with Grade II diastolic dysfunction (pseudonormalization).  Family Communication:   Disposition: Status is: Inpatient  Remains inpatient appropriate because: on IV diuresis and IV heparin for renal infarct with ongoing pain         Time spent: 30 minutes  Author: Ezekiel Slocumb 12/11/2021 4:24 PM  For on call review [email or web address].

## 2021-12-11 NOTE — Progress Notes (Signed)
ANTICOAGULATION CONSULT NOTE   Pharmacy Consult for Heparin Indication:  renal infarct  Allergies  Allergen Reactions   Codeine Nausea And Vomiting    Patient Measurements: Weight: 42.4 kg (93 lb 6.4 oz) Heparin Dosing Weight: 46.8 kg  Vital Signs: Temp: 99 F (37.2 C) (01/01 0404) Temp Source: Oral (01/01 0404) BP: 108/67 (01/01 0404) Pulse Rate: 78 (01/01 0404)  Labs: Recent Labs    12/10/21 1456 12/10/21 1650 12/10/21 2234 12/11/21 0500 12/11/21 1346  HGB 16.0*  --   --  15.1*  --   HCT 48.4*  --   --  45.8  --   PLT 170  --   --  156  --   LABPROT  --   --  13.6  --   --   INR  --   --  1.0  --   --   HEPARINUNFRC  --   --   --  0.55 0.40  CREATININE 1.19*  --   --  1.18*  --   TROPONINIHS 29* 30*  --   --   --      Estimated Creatinine Clearance: 33.5 mL/min (A) (by C-G formula based on SCr of 1.18 mg/dL (H)).   Medical History: Past Medical History:  Diagnosis Date   Acute respiratory failure ([place]) 03/19/1447   Acute systolic congestive heart failure ([place]) 02/03/2018   Allergy    Anxiety    Asthma    DM2 (diabetes mellitus, type 2) ([place]) 10/20/2020   Hypertension    PAF (paroxysmal atrial fibrillation) ([facility])    Prophylactic measure 08/03/14-08/19/14   Prophyl. 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   LIMA to LAD, SVG to D1, SVG to OM1, EVH via right thigh with implantation of Impella LD LVAD via direct aortic approach   Small cell lung cancer ([place]) 03/16/2014    Medications:  Medications Prior to Admission  Medication Sig Dispense Refill Last Dose   albuterol (VENTOLIN HFA) 108 (90 Base) MCG/ACT inhaler Inhale 2 puffs into the lungs every 6 (six) hours as needed for wheezing or shortness of breath. 1 Inhaler 0 Past Week   aspirin EC 81 MG tablet Take 1 tablet (81 mg total) by mouth daily. Swallow whole. 30 tablet 3 12/09/2021   atorvastatin (LIPITOR) 80 MG tablet Take 1 tablet (80 mg total) by mouth daily at 6 PM. 30 tablet 1  12/09/2021   CORLANOR 7.5 MG TABS tablet TAKE 1 TABLET (7.5 MG TOTAL) BY MOUTH 2 (TWO) TIMES DAILY WITH A MEAL. (Patient taking differently: Take 7.5 mg by mouth 2 (two) times daily with a meal.) 180 tablet 1 12/09/2021   FARXIGA 10 MG TABS tablet TAKE 1 TABLET (10 MG TOTAL) BY MOUTH DAILY BEFORE BREAKFAST. (Patient taking differently: Take 10 mg by mouth daily before breakfast.) 30 tablet 6 12/09/2021   Glucerna (GLUCERNA) LIQD Take 237 mLs by mouth daily in the afternoon.   12/09/2021   ipratropium-albuterol (DUONEB) 0.5-2.5 (3) MG/3ML SOLN Take 3 mLs by nebulization 2 (two) times daily as needed (for shortness of breath).   12/08/2021   metolazone (ZAROXOLYN) 2.5 MG tablet Take 2.5 mg by mouth daily as needed (edema).   Past Month   nitroGLYCERIN (NITROSTAT) 0.4 MG SL tablet Place 1 tablet (0.4 mg total) under the tongue every 5 (five) minutes as needed for chest pain. 90 tablet 5 unk   potassium chloride SA (KLOR-CON) 20 MEQ tablet Take 40 mEq by mouth daily.  12/09/2021   spironolactone (ALDACTONE) 25 MG tablet Take 0.5 tablets (12.5 mg total) by mouth daily. 45 tablet 3 12/09/2021   torsemide (DEMADEX) 20 MG tablet TAKE 3 TABLETS BY MOUTH EVERY DAY (Patient taking differently: Take 20 mg by mouth 3 (three) times daily.) 270 tablet 1 12/09/2021   ALPRAZolam (XANAX) 0.25 MG tablet Take 1 tablet (0.25 mg total) by mouth daily as needed for anxiety. (Patient not taking: Reported on 12/10/2021) 10 tablet 0 Not Taking   blood glucose meter kit and supplies KIT Dispense based on patient and insurance preference. Use up to four times daily as directed. 1 each 0    predniSONE (DELTASONE) 10 MG tablet Take 4 tablets daily X 2 days, then, Take 3 tablets daily X 2 days, then, Take 2 tablets daily X 2 days, then, Take 1 tablets daily X 1 day. (Patient not taking: Reported on 12/10/2021) 19 tablet 0 Completed Course   Assessment: 32 yof with a history of COPD/asthma, CHF, CKD 3A, diabetes, hyperlipidemia,  atrial fibrillation, anxiety, hypertension, CAD status post CABG, small cell lung cancer. Patient not on anticoagulation prior to admission. Patient is presenting with AMS, SOB, abdominal and back pain. Heparin per pharmacy consult placed for  renal infarct .  Heparin level today is 0.40 and at therapeutic goal. Hemoglobin is stable, platelet count is stable and within normal limits. No signs of bleeding or IV access issues per RN.   Goal of Therapy:  Heparin level 0.3-0.7 units/ml Monitor platelets by anticoagulation protocol: Yes   Plan:  Cont heparin 850 units/hr Confirmatory 6h heparin level  Daily heparin level and CBC Monitor for signs and symptoms of bleeding  Thank you for involving pharmacy in this patient's care.  Elita Quick, PharmD PGY1 Ambulatory Care Pharmacy Resident 12/11/2021 2:54 PM  **Pharmacist phone directory can be found on [email or web address] listed under [place]**

## 2021-12-11 NOTE — Assessment & Plan Note (Signed)
S/p chemo and XRT

## 2021-12-11 NOTE — Assessment & Plan Note (Signed)
Continue statin. 

## 2021-12-11 NOTE — Assessment & Plan Note (Addendum)
Prior to admission, not on anticoagulation prior to admission due to history of hemoptysis.  Currently in sinus rhythm. --Telemetry --Transitioned from IV heparin >> Eliquis

## 2021-12-11 NOTE — Hospital Course (Signed)
Brittany [name] is a 61 y.o. 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 Evaluation in the ED revealed likely decompensated CHF with significantly elevated BNP 2294, chest xray with stable small right pleural effusion and reports of SOB/DOE.  CT abdomen/pelvis showed a right renal infarct but no other acute abnormalities.    Patient was given IV Lasix and started on heparin drip. Admitted to [facility] service with cardiology consulted.

## 2021-12-11 NOTE — Assessment & Plan Note (Signed)
On Farxiga.  Sliding scale Novolog.

## 2021-12-11 NOTE — Assessment & Plan Note (Signed)
Due to ischemic cardiomyopathy with EF < 20%.

## 2021-12-11 NOTE — Assessment & Plan Note (Addendum)
Last echo in March of this year with EF less than 20% and grade 2 diastolic dysfunction with normal RV function.  Multiple admissions for decompensations. Presented with shortness of breath and cough typical of her CHF exacerbation.  BNP elevated to greater than 2200 in the ED.  Chest x-ray stable.  right pleural effusion.  No edema, typically does not typically get edema with her CHF exacerbations. --Cardiology consulted Follows with advanced heart failure team. Awaiting insurance approval for Barostim in outpatient setting --Telemetry --Treated with IV Lasix, now on hold with rise in Cr --Monitor renal functions and electrolytes --Strict I/O's & daily weights --Continue home spironolactone, ivabradine, [place]

## 2021-12-11 NOTE — Assessment & Plan Note (Addendum)
Renal function slightly worsened with diuresis.  Monitor closely. Diuretics on hold. Minimize other nephrotoxins.

## 2021-12-11 NOTE — Progress Notes (Signed)
°  Echocardiogram 2D Echocardiogram with contrast has been performed.  Merrie Roof F 12/11/2021, 11:41 AM

## 2021-12-11 NOTE — Assessment & Plan Note (Addendum)
Mild wheezing on exam but not overtly exacerbated. Continue Duonebs. Low threshold to start steroids if wheezing.

## 2021-12-11 NOTE — Assessment & Plan Note (Signed)
CAD stable, no chest pain. Continue ASA, statin Not on BB due to intolerance and low output

## 2021-12-11 NOTE — Assessment & Plan Note (Addendum)
BP on soft side. Continue spironolactone, currently off Lasix

## 2021-12-11 NOTE — Plan of Care (Signed)
  Problem: Activity: Goal: Risk for activity intolerance will decrease Outcome: Progressing   Problem: Nutrition: Goal: Adequate nutrition will be maintained Outcome: Progressing   Problem: Coping: Goal: Level of anxiety will decrease Outcome: Progressing   

## 2021-12-11 NOTE — Progress Notes (Signed)
ANTICOAGULATION CONSULT NOTE   Pharmacy Consult for Heparin Indication:  renal infarct  Allergies  Allergen Reactions   Codeine Nausea And Vomiting    Patient Measurements: Weight: 42.4 kg (93 lb 6.4 oz) Heparin Dosing Weight: 46.8 kg  Vital Signs: Temp: 99 F (37.2 C) (01/01 0404) Temp Source: Oral (01/01 0404) BP: 108/67 (01/01 0404) Pulse Rate: 78 (01/01 0404)  Labs: Recent Labs    12/10/21 1456 12/10/21 1650 12/10/21 2234 12/11/21 0500  HGB 16.0*  --   --  15.1*  HCT 48.4*  --   --  45.8  PLT 170  --   --  156  LABPROT  --   --  13.6  --   INR  --   --  1.0  --   HEPARINUNFRC  --   --   --  0.55  CREATININE 1.19*  --   --   --   TROPONINIHS 29* 30*  --   --      Estimated Creatinine Clearance: 33.2 mL/min (A) (by C-G formula based on SCr of 1.19 mg/dL (H)).   Medical History: Past Medical History:  Diagnosis Date   Acute respiratory failure ([place]) 5/97/4163   Acute systolic congestive heart failure ([place]) 02/03/2018   Allergy    Anxiety    Asthma    DM2 (diabetes mellitus, type 2) ([place]) 10/20/2020   Hypertension    PAF (paroxysmal atrial fibrillation) ([facility])    Prophylactic measure 08/03/14-08/19/14   Prophyl. 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   LIMA to LAD, SVG to D1, SVG to OM1, EVH via right thigh with implantation of Impella LD LVAD via direct aortic approach   Small cell lung cancer ([place]) 03/16/2014    Medications:  Medications Prior to Admission  Medication Sig Dispense Refill Last Dose   albuterol (VENTOLIN HFA) 108 (90 Base) MCG/ACT inhaler Inhale 2 puffs into the lungs every 6 (six) hours as needed for wheezing or shortness of breath. 1 Inhaler 0 Past Week   aspirin EC 81 MG tablet Take 1 tablet (81 mg total) by mouth daily. Swallow whole. 30 tablet 3 12/09/2021   atorvastatin (LIPITOR) 80 MG tablet Take 1 tablet (80 mg total) by mouth daily at 6 PM. 30 tablet 1 12/09/2021   CORLANOR 7.5 MG TABS tablet TAKE 1 TABLET (7.5  MG TOTAL) BY MOUTH 2 (TWO) TIMES DAILY WITH A MEAL. (Patient taking differently: Take 7.5 mg by mouth 2 (two) times daily with a meal.) 180 tablet 1 12/09/2021   FARXIGA 10 MG TABS tablet TAKE 1 TABLET (10 MG TOTAL) BY MOUTH DAILY BEFORE BREAKFAST. (Patient taking differently: Take 10 mg by mouth daily before breakfast.) 30 tablet 6 12/09/2021   Glucerna (GLUCERNA) LIQD Take 237 mLs by mouth daily in the afternoon.   12/09/2021   ipratropium-albuterol (DUONEB) 0.5-2.5 (3) MG/3ML SOLN Take 3 mLs by nebulization 2 (two) times daily as needed (for shortness of breath).   12/08/2021   metolazone (ZAROXOLYN) 2.5 MG tablet Take 2.5 mg by mouth daily as needed (edema).   Past Month   nitroGLYCERIN (NITROSTAT) 0.4 MG SL tablet Place 1 tablet (0.4 mg total) under the tongue every 5 (five) minutes as needed for chest pain. 90 tablet 5 unk   potassium chloride SA (KLOR-CON) 20 MEQ tablet Take 40 mEq by mouth daily.   12/09/2021   spironolactone (ALDACTONE) 25 MG tablet Take 0.5 tablets (12.5 mg total) by mouth daily. 45 tablet 3 12/09/2021  torsemide (DEMADEX) 20 MG tablet TAKE 3 TABLETS BY MOUTH EVERY DAY (Patient taking differently: Take 20 mg by mouth 3 (three) times daily.) 270 tablet 1 12/09/2021   ALPRAZolam (XANAX) 0.25 MG tablet Take 1 tablet (0.25 mg total) by mouth daily as needed for anxiety. (Patient not taking: Reported on 12/10/2021) 10 tablet 0 Not Taking   blood glucose meter kit and supplies KIT Dispense based on patient and insurance preference. Use up to four times daily as directed. 1 each 0    predniSONE (DELTASONE) 10 MG tablet Take 4 tablets daily X 2 days, then, Take 3 tablets daily X 2 days, then, Take 2 tablets daily X 2 days, then, Take 1 tablets daily X 1 day. (Patient not taking: Reported on 12/10/2021) 19 tablet 0 Completed Course    Scheduled:   atorvastatin  80 mg Oral q1800   dapagliflozin propanediol  10 mg Oral QAC breakfast   furosemide  80 mg Intravenous BID   insulin  aspart  0-9 Units Subcutaneous TID WC   ivabradine  7.5 mg Oral BID WC   sodium chloride flush  3 mL Intravenous Q12H   spironolactone  12.5 mg Oral Daily   Infusions:   heparin 850 Units/hr (12/10/21 2320)   PRN: acetaminophen **OR** acetaminophen, HYDROcodone-acetaminophen, HYDROmorphone (DILAUDID) injection, ipratropium-albuterol, nitroGLYCERIN, polyethylene glycol  Assessment: 87 yof with a history of COPD/asthma, CHF, CKD 3A, diabetes, hyperlipidemia, atrial fibrillation, anxiety, hypertension, CAD status post CABG, small cell lung cancer . Patient is presenting with AMS, SOB, abdominal and back pain. Heparin per pharmacy consult placed for  renal infarct .  Patient is not on anticoagulation prior to arrival.  Hgb 16; plt 170  1/1 AM update:  Heparin level therapeutic   Goal of Therapy:  Heparin level 0.3-0.7 units/ml Monitor platelets by anticoagulation protocol: Yes   Plan:  Cont heparin 850 units/hr Check anti-Xa level in 6-8 hours and daily while on heparin Continue to monitor H&H and platelets  Narda Bonds, PharmD, BCPS Clinical Pharmacist Phone: [phone]

## 2021-12-11 NOTE — Progress Notes (Addendum)
ANTICOAGULATION CONSULT NOTE   Pharmacy Consult for Heparin Indication:  renal infarct  Allergies  Allergen Reactions   Codeine Nausea And Vomiting    Patient Measurements: Weight: 42.4 kg (93 lb 6.4 oz) Heparin Dosing Weight: 46.8 kg  Vital Signs: Temp: 100.7 F (38.2 C) (01/01 2111) Temp Source: Oral (01/01 2111) BP: 103/59 (01/01 2111) Pulse Rate: 73 (01/01 2111)  Labs: Recent Labs    12/10/21 1456 12/10/21 1650 12/10/21 2234 12/11/21 0500 12/11/21 1346 12/11/21 2035  HGB 16.0*  --   --  15.1*  --   --   HCT 48.4*  --   --  45.8  --   --   PLT 170  --   --  156  --   --   LABPROT  --   --  13.6  --   --   --   INR  --   --  1.0  --   --   --   HEPARINUNFRC  --   --   --  0.55 0.40 0.34  CREATININE 1.19*  --   --  1.18*  --   --   TROPONINIHS 29* 30*  --   --   --   --      Estimated Creatinine Clearance: 33.5 mL/min (A) (by C-G formula based on SCr of 1.18 mg/dL (H)).   Medical History: Past Medical History:  Diagnosis Date   Acute respiratory failure ([place]) 0/48/8891   Acute systolic congestive heart failure ([place]) 02/03/2018   Allergy    Anxiety    Asthma    DM2 (diabetes mellitus, type 2) ([place]) 10/20/2020   Hypertension    PAF (paroxysmal atrial fibrillation) ([facility])    Prophylactic measure 08/03/14-08/19/14   Prophyl. 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   LIMA to LAD, SVG to D1, SVG to OM1, EVH via right thigh with implantation of Impella LD LVAD via direct aortic approach   Small cell lung cancer ([place]) 03/16/2014    Medications:  Medications Prior to Admission  Medication Sig Dispense Refill Last Dose   albuterol (VENTOLIN HFA) 108 (90 Base) MCG/ACT inhaler Inhale 2 puffs into the lungs every 6 (six) hours as needed for wheezing or shortness of breath. 1 Inhaler 0 Past Week   aspirin EC 81 MG tablet Take 1 tablet (81 mg total) by mouth daily. Swallow whole. 30 tablet 3 12/09/2021   atorvastatin (LIPITOR) 80 MG tablet Take 1  tablet (80 mg total) by mouth daily at 6 PM. 30 tablet 1 12/09/2021   CORLANOR 7.5 MG TABS tablet TAKE 1 TABLET (7.5 MG TOTAL) BY MOUTH 2 (TWO) TIMES DAILY WITH A MEAL. (Patient taking differently: Take 7.5 mg by mouth 2 (two) times daily with a meal.) 180 tablet 1 12/09/2021   FARXIGA 10 MG TABS tablet TAKE 1 TABLET (10 MG TOTAL) BY MOUTH DAILY BEFORE BREAKFAST. (Patient taking differently: Take 10 mg by mouth daily before breakfast.) 30 tablet 6 12/09/2021   Glucerna (GLUCERNA) LIQD Take 237 mLs by mouth daily in the afternoon.   12/09/2021   ipratropium-albuterol (DUONEB) 0.5-2.5 (3) MG/3ML SOLN Take 3 mLs by nebulization 2 (two) times daily as needed (for shortness of breath).   12/08/2021   metolazone (ZAROXOLYN) 2.5 MG tablet Take 2.5 mg by mouth daily as needed (edema).   Past Month   nitroGLYCERIN (NITROSTAT) 0.4 MG SL tablet Place 1 tablet (0.4 mg total) under the tongue every 5 (five) minutes as  needed for chest pain. 90 tablet 5 unk   potassium chloride SA (KLOR-CON) 20 MEQ tablet Take 40 mEq by mouth daily.   12/09/2021   spironolactone (ALDACTONE) 25 MG tablet Take 0.5 tablets (12.5 mg total) by mouth daily. 45 tablet 3 12/09/2021   torsemide (DEMADEX) 20 MG tablet TAKE 3 TABLETS BY MOUTH EVERY DAY (Patient taking differently: Take 20 mg by mouth 3 (three) times daily.) 270 tablet 1 12/09/2021   ALPRAZolam (XANAX) 0.25 MG tablet Take 1 tablet (0.25 mg total) by mouth daily as needed for anxiety. (Patient not taking: Reported on 12/10/2021) 10 tablet 0 Not Taking   blood glucose meter kit and supplies KIT Dispense based on patient and insurance preference. Use up to four times daily as directed. 1 each 0    predniSONE (DELTASONE) 10 MG tablet Take 4 tablets daily X 2 days, then, Take 3 tablets daily X 2 days, then, Take 2 tablets daily X 2 days, then, Take 1 tablets daily X 1 day. (Patient not taking: Reported on 12/10/2021) 19 tablet 0 Completed Course   Assessment: 71 yof with a history  of COPD/asthma, CHF, CKD 3A, diabetes, hyperlipidemia, atrial fibrillation, anxiety, hypertension, CAD status post CABG, small cell lung cancer. Patient not on anticoagulation prior to admission. Patient is presenting with AMS, SOB, abdominal and back pain. Heparin per pharmacy consult placed for  renal infarct .  Heparin level today is 0.34 and at therapeutic goal. Hemoglobin is stable, platelet count is stable and within normal limits. No signs of bleeding or IV access issues per chart.   Goal of Therapy:  Heparin level 0.3-0.7 units/ml Monitor platelets by anticoagulation protocol: Yes   Plan:  Cont heparin 850 units/hr Daily heparin level and CBC Monitor for signs and symptoms of bleeding  Thank you for allowing pharmacy to participate in this patient's care.  Reatha Harps, PharmD PGY1 Pharmacy Resident 12/11/2021 9:19 PM Check [email or web address] for unit specific pharmacy number

## 2021-12-11 NOTE — Consult Note (Signed)
Cardiology Consultation:   Patient ID: Brittany [name] MRN: [id number]; DOB: 08/24/1961  Admit date: 12/10/2021 Date of Consult: 12/11/2021  PCP:  Maude Leriche, PA-C   [facility] Providers Cardiologist:  None  Electrophysiologist:  Thompson Grayer, MD  Advanced Heart Failure:  Glori Bickers, MD  {  Patient Profile:   Brittany [name] is a 61 y.o. female with a history of CAD s/p CABG in 01/2018 (LIMA to LAD, SVG to D1, SVG to OM1) and DES to ostial ramus into distal left main in 02/2019, chronic CHF with EF of <20% in 02/2021 s/p ICD, paroxysmal atrial fibrillation only on Aspirin due to hemoptysis in the past, recurrent pleural effusions s/p pleurodesis, COPD, hypertension, type 2 diabetes mellitus, prior lung cancer treated with chemo and radiation as well as prophylactic brain radiation in 2015 who is being seen 12/11/2021 for the evaluation of acute on chronic CHF at the request of Dr. Arbutus Ped.  History of Present Illness:   Brittany [name] is a 61 year old female with the above history who is followed by Dr. Rayann Heman and Dr. [name]. Patient  has had multiple admission over the last couple of years for CHF. Last Echo in 02/2021 showed LVEF of <20% with severe global hypokinesis with areas of akinesis, grade 2 diastolic dysfunction, normal RV, mild MR, moderate AI, and mildly elevated PASP. Most recently admitted in 09/2021 with acute on chronic respiratory failure secondary to a combination of COPD and CHF exacerbation initially requiring BiPAP She was diuresed with IV Lasix and given Solumedrol and bronchodilators. She was last seen by Darrick Grinder, NP, on 10/10/2021 for follow-up at which time she was feeling well overall. She had mild shortness of breath with exertion but orthopnea of PND. Weight was stable and she was taking all of her medications.  Patient presented to the ED on 12/10/2021 with shortness of breath and abdominal pain.In the ED, vitals stable. EKG  showed normal sinus rhythm, rate 81 bpm, with abnormal T wave changes in leads I, aVL, and V2 and prolonged QTc of 525 ms.. High-sensitivity troponin elevated at 29 >> 30. BNP elevated at 2,294. Chest x-ray showed small right pleural effusion (unchanged from prior exam) but no focal pneumonia or pulmonary edema. WBC 10.1, Hgb 16.0, Plts 170. Na 138, K 3.4, Glucose 151, BUN 15, Cr 1.19. AST 69, ALT 33, Alk Phos 87, Total Bili 1.5. Lactic acid normal. Lipase normal. Respiratory panel negative for COVID and influenza A/B. CT consistent with new right renal infarct that does not appear to be acute. Patient was started on IV Lasix and IV Heparin and admitted for further evaluation.  At the time of this evaluation, patient resting comfortably in no acute distress.  Patient states she was in her usual state of health until about 2 days ago when she started having significant shortness of breath even at rest with orthopnea (she is she was unable to sleep) as well as significant right back/flank/abdominal and pain.  She reports some nausea and vomiting with this as well as a decreased appetite; however, it sounds like decreased appetite and nausea is not new in intermittently bothers her.  He does report ports some vague chest discomfort with her symptoms the past 2 days has difficulty describing this.  Pain occurred at rest and states it only last for a few seconds to minute at a time.  She denies any chest pain at this time after receiving pain medications.  She denies any palpitations, lightheadedness, dizziness, syncope.  No lower  extremity edema.  She denies any weight gain and states she is actually been losing a lot of weight. She notes 1 episode of blood on the toilet paper after urinating 3 days ago but no other abnormal bleeding in urine or stools.  No hemoptysis.  She denies any fevers or myalgias.  Past Medical History:  Diagnosis Date   Acute respiratory failure ([place]) 2/63/7858   Acute systolic congestive  heart failure ([place]) 02/03/2018   Allergy    Anxiety    Asthma    DM2 (diabetes mellitus, type 2) ([place]) 10/20/2020   Hypertension    PAF (paroxysmal atrial fibrillation) ([facility])    Prophylactic measure 08/03/14-08/19/14   Prophyl. 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   LIMA to LAD, SVG to D1, SVG to OM1, EVH via right thigh with implantation of Impella LD LVAD via direct aortic approach   Small cell lung cancer ([place]) 03/16/2014    Past Surgical History:  Procedure Laterality Date   BRONCHIAL BRUSHINGS  10/25/2020   Procedure: BRONCHIAL BRUSHINGS;  Surgeon: Collene Gobble, MD;  Location: [facility] ENDOSCOPY;  Service: Pulmonary;;   BRONCHIAL NEEDLE ASPIRATION BIOPSY  10/25/2020   Procedure: BRONCHIAL NEEDLE ASPIRATION BIOPSIES;  Surgeon: Collene Gobble, MD;  Location: [place] ENDOSCOPY;  Service: Pulmonary;;   CARDIAC DEFIBRILLATOR PLACEMENT  08/15/2018   MDT Visia AF MRI VR ICD implanted by Dr Loel Lofty for primary prevention of sudden   CESAREAN SECTION     CORONARY ARTERY BYPASS GRAFT N/A 02/03/2018   Procedure: CORONARY ARTERY BYPASS GRAFTING (CABG);  Surgeon: Rexene Alberts, MD;  Location: [place];  Service: Open Heart Surgery;  Laterality: N/A;  Time 3 using left internal mammary artery and endoscopically harvested right saphenous vein   CORONARY BALLOON ANGIOPLASTY N/A 02/03/2018   Procedure: CORONARY BALLOON ANGIOPLASTY;  Surgeon: Martinique, Peter M, MD;  Location: [place] CV LAB;  Service: Cardiovascular;  Laterality: N/A;   CORONARY STENT INTERVENTION N/A 02/12/2019   Procedure: CORONARY STENT INTERVENTION;  Surgeon: Wellington Hampshire, MD;  Location: [place] CV LAB;  Service: Cardiovascular;  Laterality: N/A;   CORONARY/GRAFT ACUTE MI REVASCULARIZATION N/A 02/03/2018   Procedure: Coronary/Graft Acute MI Revascularization;  Surgeon: Martinique, Peter M, MD;  Location: [place] CV LAB;  Service: Cardiovascular;  Laterality: N/A;   ENDOBRONCHIAL ULTRASOUND N/A 10/25/2020    Procedure: ENDOBRONCHIAL ULTRASOUND;  Surgeon: Collene Gobble, MD;  Location: [facility] ENDOSCOPY;  Service: Pulmonary;  Laterality: N/A;   FLEXIBLE BRONCHOSCOPY  10/25/2020   Procedure: FLEXIBLE BRONCHOSCOPY;  Surgeon: Collene Gobble, MD;  Location: [facility] ENDOSCOPY;  Service: Pulmonary;;   IABP INSERTION N/A 02/03/2018   Procedure: IABP Insertion;  Surgeon: Martinique, Peter M, MD;  Location: [place] CV LAB;  Service: Cardiovascular;  Laterality: N/A;   INTRAOPERATIVE TRANSESOPHAGEAL ECHOCARDIOGRAM N/A 02/03/2018   Procedure: INTRAOPERATIVE TRANSESOPHAGEAL ECHOCARDIOGRAM;  Surgeon: Rexene Alberts, MD;  Location: [place];  Service: Open Heart Surgery;  Laterality: N/A;   LEFT HEART CATH AND CORONARY ANGIOGRAPHY N/A 02/03/2018   Procedure: LEFT HEART CATH AND CORONARY ANGIOGRAPHY;  Surgeon: Martinique, Peter M, MD;  Location: [place] CV LAB;  Service: Cardiovascular;  Laterality: N/A;   LEFT HEART CATH AND CORS/GRAFTS ANGIOGRAPHY N/A 02/12/2019   Procedure: LEFT HEART CATH AND CORS/GRAFTS ANGIOGRAPHY;  Surgeon: Wellington Hampshire, MD;  Location: [place] CV LAB;  Service: Cardiovascular;  Laterality: N/A;   MEDIASTINOSCOPY N/A 03/11/2014   Procedure: MEDIASTINOSCOPY;  Surgeon: Melrose Nakayama, MD;  Location: [place] OR;  Service: Thoracic;  Laterality: N/A;   PLACEMENT OF IMPELLA LEFT VENTRICULAR ASSIST DEVICE  02/03/2018   Procedure: PLACEMENT OF [place] LEFT VENTRICULAR ASSIST DEVICE LD;  Surgeon: Rexene Alberts, MD;  Location: [place];  Service: Open Heart Surgery;;   REMOVAL OF [place] LEFT VENTRICULAR ASSIST DEVICE N/A 02/08/2018   Procedure: REMOVAL OF [place] LEFT VENTRICULAR ASSIST DEVICE;  Surgeon: Rexene Alberts, MD;  Location: [place];  Service: Open Heart Surgery;  Laterality: N/A;   RIGHT HEART CATH N/A 02/03/2018   Procedure: RIGHT HEART CATH;  Surgeon: Martinique, Peter M, MD;  Location: [place] CV LAB;  Service: Cardiovascular;  Laterality: N/A;   RIGHT HEART CATH N/A 05/09/2018   Procedure: RIGHT  HEART CATH;  Surgeon: Jolaine Artist, MD;  Location: [place] CV LAB;  Service: Cardiovascular;  Laterality: N/A;   TEE WITHOUT CARDIOVERSION N/A 02/08/2018   Procedure: TRANSESOPHAGEAL ECHOCARDIOGRAM (TEE);  Surgeon: Rexene Alberts, MD;  Location: [place];  Service: Open Heart Surgery;  Laterality: N/A;   TUBAL LIGATION     VIDEO BRONCHOSCOPY WITH ENDOBRONCHIAL ULTRASOUND N/A 03/11/2014   Procedure: VIDEO BRONCHOSCOPY WITH ENDOBRONCHIAL ULTRASOUND;  Surgeon: Melrose Nakayama, MD;  Location: [place];  Service: Thoracic;  Laterality: N/A;     Home Medications:  Prior to Admission medications   Medication Sig Start Date End Date Taking? Authorizing Provider  albuterol (VENTOLIN HFA) 108 (90 Base) MCG/ACT inhaler Inhale 2 puffs into the lungs every 6 (six) hours as needed for wheezing or shortness of breath. 04/16/19  Yes Bensimhon, Shaune Pascal, MD  aspirin EC 81 MG tablet Take 1 tablet (81 mg total) by mouth daily. Swallow whole. 11/10/20  Yes Bensimhon, Shaune Pascal, MD  atorvastatin (LIPITOR) 80 MG tablet Take 1 tablet (80 mg total) by mouth daily at 6 PM. 07/03/18  Yes Bensimhon, Shaune Pascal, MD  CORLANOR 7.5 MG TABS tablet TAKE 1 TABLET (7.5 MG TOTAL) BY MOUTH 2 (TWO) TIMES DAILY WITH A MEAL. Patient taking differently: Take 7.5 mg by mouth 2 (two) times daily with a meal. 10/10/21  Yes Bensimhon, Shaune Pascal, MD  FARXIGA 10 MG TABS tablet TAKE 1 TABLET (10 MG TOTAL) BY MOUTH DAILY BEFORE BREAKFAST. Patient taking differently: Take 10 mg by mouth daily before breakfast. 08/16/21  Yes Bensimhon, Shaune Pascal, MD  Glucerna (GLUCERNA) LIQD Take 237 mLs by mouth daily in the afternoon.   Yes [provider]  ipratropium-albuterol (DUONEB) 0.5-2.5 (3) MG/3ML SOLN Take 3 mLs by nebulization 2 (two) times daily as needed (for shortness of breath).   Yes [provider]  metolazone (ZAROXOLYN) 2.5 MG tablet Take 2.5 mg by mouth daily as needed (edema).   Yes [provider]  nitroGLYCERIN  (NITROSTAT) 0.4 MG SL tablet Place 1 tablet (0.4 mg total) under the tongue every 5 (five) minutes as needed for chest pain. 02/14/19  Yes Shirley Friar, PA-C  potassium chloride SA (KLOR-CON) 20 MEQ tablet Take 40 mEq by mouth daily.   Yes [provider]  spironolactone (ALDACTONE) 25 MG tablet Take 0.5 tablets (12.5 mg total) by mouth daily. 03/01/21  Yes Clegg, Amy D, NP  torsemide (DEMADEX) 20 MG tablet TAKE 3 TABLETS BY MOUTH EVERY DAY Patient taking differently: Take 20 mg by mouth 3 (three) times daily. 10/03/21  Yes Shirley Friar, PA-C  ALPRAZolam Duanne Moron) 0.25 MG tablet Take 1 tablet (0.25 mg total) by mouth daily as needed for anxiety. Patient not taking: Reported on  12/10/2021 02/24/19   Shirley Friar, PA-C  blood glucose meter kit and supplies KIT Dispense based on patient and insurance preference. Use up to four times daily as directed. 04/11/21   Bonnielee Haff, MD  predniSONE (DELTASONE) 10 MG tablet Take 4 tablets daily X 2 days, then, Take 3 tablets daily X 2 days, then, Take 2 tablets daily X 2 days, then, Take 1 tablets daily X 1 day. Patient not taking: Reported on 12/10/2021 10/01/21   Terrilee Croak, MD    Inpatient Medications: Scheduled Meds:  atorvastatin  80 mg Oral q1800   dapagliflozin propanediol  10 mg Oral QAC breakfast   furosemide  80 mg Intravenous BID   insulin aspart  0-9 Units Subcutaneous TID WC   ivabradine  7.5 mg Oral BID WC   sodium chloride flush  3 mL Intravenous Q12H   spironolactone  12.5 mg Oral Daily   Continuous Infusions:  heparin 850 Units/hr (12/10/21 2320)   PRN Meds: acetaminophen **OR** acetaminophen, HYDROcodone-acetaminophen, ipratropium-albuterol, nitroGLYCERIN, polyethylene glycol  Allergies:    Allergies  Allergen Reactions   Codeine Nausea And Vomiting    Social History:   Social History   Socioeconomic History   Marital status: Married    Spouse name: Michell Heinrich    Number of  children: 1   Years of education: Not on file   Highest education level: Not on file  Occupational History   Not on file  Tobacco Use   Smoking status: Former    Packs/day: 1.00    Years: 20.00    Pack years: 20.00    Types: Cigarettes    Quit date: 03/13/2014    Years since quitting: 7.7   Smokeless tobacco: Never  Vaping Use   Vaping Use: Never used  Substance and Sexual Activity   Alcohol use: Yes    Alcohol/week: 8.0 standard drinks    Types: 8 Glasses of wine per week   Drug use: Yes    Types: Marijuana    Comment: "medicinal"   Sexual activity: Never  Other Topics Concern   Not on file  Social History Narrative   Not on file   Social Determinants of Health   Financial Resource Strain: Medium Risk   Difficulty of Paying Living Expenses: Somewhat hard  Food Insecurity: Food Insecurity Present   Worried About Charity fundraiser in the Last Year: Sometimes true   Arboriculturist in the Last Year: Never true  Transportation Needs: Unmet Transportation Needs   Lack of Transportation (Medical): No   Lack of Transportation (Non-Medical): Yes  Physical Activity: Insufficiently Active   Days of Exercise per Week: 2 days   Minutes of Exercise per Session: 10 min  Stress: Stress Concern Present   Feeling of Stress : To some extent  Social Connections: Unknown   Frequency of Communication with Friends and Family: More than three times a week   Frequency of Social Gatherings with Friends and Family: Twice a week   Attends Religious Services: Not on Electrical engineer or Organizations: Not on file   Attends Archivist Meetings: Never   Marital Status: Married  Human resources officer Violence: Not on file    Family History:   Family History  Problem Relation Age of Onset   Heart disease Mother    Hypertension Mother    Heart attack Mother    Hypertension Maternal Grandmother    Cancer Maternal Grandmother    Diabetes Paternal  Grandmother    Stroke Neg  Hx      ROS:  Please see the history of present illness.  All other ROS reviewed and negative.    Physical Exam/Data:   Vitals:   12/10/21 1815 12/10/21 2230 12/11/21 0334 12/11/21 0404  BP: 130/68 131/74  108/67  Pulse: 81 92  78  Resp: _0 Temp:  99 F (37.2 C)  99 F (37.2 C)  TempSrc:  Oral  Oral  SpO2: 100% 98%  100%  Weight:   42.4 kg     Intake/Output Summary (Last 24 hours) at 12/11/2021 1427 Last data filed at 12/11/2021 1227 Gross per 24 hour  Intake 57.26 ml  Output 600 ml  Net -542.74 ml   Last 3 Weights 12/11/2021 10/10/2021 10/01/2021  Weight (lbs) 93 lb 6.4 oz 104 lb 104 lb 8 oz  Weight (kg) 42.366 kg 47.174 kg 47.4 kg     Body mass index is 18.55 kg/m.  General: 61 y.o. thin female resting comfortably in no acute distress. HEENT: Normocephalic and atraumatic. Sclera clear.  Neck: Supple. No carotid bruits. No JVD. Heart: RRR. Distinct S1 and S2. II/VI murmur noted. No gallops or rubs. Radial and posterior tibial pulses 2+ and equal bilaterally. Lungs: No increased work of breathing. Decreased breath sounds throughout with scattered wheezes. No significant crackles. Abdomen: Soft, non-distended, and non-tender to palpation.  Bowel sounds present. MSK: Normal strength and tone for age. Extremities: No lower extremity edema.    Skin: Warm and dry. Neuro: Alert and oriented x3. No focal deficits. Psych: Normal affect. Responds appropriately.  EKG:  The EKG was personally reviewed and demonstrates: Normal sinus rhythm, rate 81 bpm, with abnormal T wave changes in leads I, aVL, and V2 and prolonged QTc of 525 ms. Telemetry:  Telemetry was personally reviewed and demonstrates:  Normal sinus rhythm with rates in the 60s to 70s.  Relevant CV Studies:  Left Cardiac Catheterization 02/12/2019: Ost LAD to Prox LAD lesion is 100% stenosed. Ost Cx to Prox Cx lesion is 100% stenosed. Ost Ramus lesion is 99% stenosed. Post intervention, there is a 0% residual  stenosis. A drug-eluting stent was successfully placed using a [place] 2.25X15. SVG and is normal in caliber. The graft exhibits no disease. Ost 1st Diag lesion is 70% stenosed. SVG and is normal in caliber. Prox Graft lesion is 100% stenosed.   1.  Significant underlying two-vessel coronary artery disease with patent SVG to OM and SVG to diagonal which supplies the LAD territory.  Atretic LIMA to LAD given competitive flow from SVG to diagonal.  Severe ostial ramus artery stenosis not supplied by bypass with his area suggestive of plaque rupture. 2.  Severely elevated left ventricular end-diastolic pressure 34 mmHg.  Left ventricular angiography was not performed. 3.  Successful angioplasty and drug-eluting stent placement to the ostial ramus extending into the distal left main.   Recommendations: Dual antiplatelet therapy for at least 1 year. The patient is volume overloaded and she has already received 1 dose of IV furosemide before cardiac catheterization with good urine output.  I am not going to diurese further today to avoid risk of contrast-induced nephropathy.  She will require resumption of diuresis tomorrow and optimization of her heart failure medications. The patient might be ischemic in the LAD territory given the presence of ostial diagonal stenosis which might affect retrograde flow into the LAD.  However, this is not amenable for PCI.  Also, it is not  entirely clear if that area is viable.  Probably best to treat that medically.  Diagnostic Dominance: Right  Intervention     _______________  Echocardiogram 02/10/2021: Impressions:  1. Left ventricular ejection fraction, by estimation, is <20%. The left  ventricle has severely decreased function. The left ventricle demonstrates  severe global hypokinesis with areas of akinesis detailed below. The left  ventricular internal cavity size   was moderately dilated. Left ventricular diastolic parameters are   consistent with Grade II diastolic dysfunction (pseudonormalization).  Elevated left atrial pressure.   2. Right ventricular systolic function is normal. The right ventricular  size is normal. There is mildly elevated pulmonary artery systolic  pressure.   3. Left atrial size was moderately dilated.   4. The mitral valve is degenerative. Mild mitral valve regurgitation.   5. The aortic valve is tricuspid. There is mild calcification of the  aortic valve. There is mild thickening of the aortic valve. Aortic valve  regurgitation is moderate.   6. The inferior vena cava is normal in size with greater than 50%  respiratory variability, suggesting right atrial pressure of 3 mmHg.   Comparison(s): Compared to prior study on 10/2020, the EF dropped slightly  to <20%. Otherwise, no significant change.   Laboratory Data:  High Sensitivity Troponin:   Recent Labs  Lab 12/10/21 1456 12/10/21 1650  TROPONINIHS 29* 30*     Chemistry Recent Labs  Lab 12/10/21 1456 12/10/21 2234 12/11/21 0500  NA 138  --  138  K 3.4*  --  3.6  CL 102  --  105  CO2 26  --  28  GLUCOSE 151*  --  108*  BUN 15  --  12  CREATININE 1.19*  --  1.18*  CALCIUM 9.9  --  9.0  MG  --  2.0  --   GFRNONAA 52*  --  53*  ANIONGAP 10  --  5    Recent Labs  Lab 12/10/21 1456 12/11/21 0500  PROT 7.3 6.4*  ALBUMIN 4.1 3.5  AST 69* 94*  ALT 33 61*  ALKPHOS 87 78  BILITOT 1.5* 1.4*   Lipids No results for input(s): CHOL, TRIG, HDL, LABVLDL, LDLCALC, CHOLHDL in the last 168 hours.  Hematology Recent Labs  Lab 12/10/21 1456 12/11/21 0500  WBC 10.1 9.3  RBC 5.14* 4.83  HGB 16.0* 15.1*  HCT 48.4* 45.8  MCV 94.2 94.8  MCH 31.1 31.3  MCHC 33.1 33.0  RDW 21.2* 21.2*  PLT 170 156   Thyroid No results for input(s): TSH, FREET4 in the last 168 hours.  BNP Recent Labs  Lab 12/10/21 1653  BNP 2,294.6*    DDimer No results for input(s): DDIMER in the last 168 hours.   Radiology/Studies:  DG Chest 2  View  Result Date: 12/10/2021 CLINICAL DATA:  Cough and shortness of breath. EXAM: CHEST - 2 VIEW COMPARISON:  September 28, 2021 FINDINGS: The heart size and mediastinal contours are stable. Heart size is enlarged. Cardiac pacemaker is unchanged. Small right pleural effusion is identified unchanged compared prior exam. There is no focal pneumonia. Minimal scarring of the left lung base is unchanged. The visualized skeletal structures are unremarkable. IMPRESSION: Small right pleural effusion unchanged compared prior exam. No focal pneumonia or pulmonary edema. Electronically Signed   By: Abelardo Diesel M.D.   On: 12/10/2021 15:29   CT ABDOMEN PELVIS W CONTRAST  Result Date: 12/10/2021 CLINICAL DATA:  Right lower quadrant abdominal pain. Nausea vomiting. Productive cough and chills.  EXAM: CT ABDOMEN AND PELVIS WITH CONTRAST TECHNIQUE: Multidetector CT imaging of the abdomen and pelvis was performed using the standard protocol following bolus administration of intravenous contrast. CONTRAST:  19m ISOVUE-370 IOPAMIDOL (ISOVUE-370) INJECTION 76% COMPARISON:  01/27/2020. FINDINGS: Lower chest: No acute findings. Stable appearance from the prior CT. Hepatobiliary: Liver normal in size and overall attenuation. No liver mass or focal lesion. Gallbladder unremarkable. No bile duct dilation. Pancreas: Unremarkable. No pancreatic ductal dilatation or surrounding inflammatory changes. Spleen: Normal in size without focal abnormality. Adrenals/Urinary Tract: No adrenal masses. Focal hypoattenuation/lack of enhancement along the posterior aspect of the left kidney from the upper to the mid to lower pole, new from the prior CT. This is likely an area of interval renal infarction. No defined renal masses. No stones. No hydronephrosis. Ureters are normal in course and in caliber. Bladder is unremarkable. Stomach/Bowel: Unremarkable stomach. Small bowel and colon are normal in caliber. No wall thickening. No inflammation.  Normal appendix visualized. Vascular/Lymphatic: Aortic atherosclerosis. No aneurysm. No enlarged lymph nodes. Reproductive: Status post hysterectomy. No adnexal masses. Other: No abdominal wall hernia or abnormality. No abdominopelvic ascites. Musculoskeletal: No acute or significant osseous findings. IMPRESSION: 1. Focal hypo attenuation and lack of enhancement along the posterior aspect of the right kidney, new from the prior CT, consistent with an area of interval infarction. This does not appear acute. 2. No convincing acute abnormality within the abdomen or pelvis. 3. No renal or ureteral stones or obstructive uropathy. 4. Aortic atherosclerosis. 5. Electronically Signed   By: DLajean ManesM.D.   On: 12/10/2021 19:50   ECHOCARDIOGRAM COMPLETE  Result Date: 12/11/2021    ECHOCARDIOGRAM REPORT   Patient Name:   LZYKERRIA TANTONDate of Exam: 12/11/2021 Medical Rec #:  [id number]              Height:       59.5 in Accession #:    [id number]             Weight:       93.4 lb Date of Birth:  115-Nov-1962              BSA:          1.342 m Patient Age:    89years                BP:           110/62 mmHg Patient Gender: F                       HR:           71 bpm. Exam Location:  Inpatient Procedure: 2D Echo, Cardiac Doppler, Color Doppler and Intracardiac            Opacification Agent Indications:    CHF  History:        Patient has prior history of Echocardiogram examinations, most                 recent 10/21/2020. CHF, CAD, Prior CABG; Risk                 Factors:Hypertension and Diabetes.  Sonographer:    RMerrie RoofRDCS Referring Phys: [id number] 1. Left ventricular ejection fraction, by estimation, is 20 to 25%. Left ventricular ejection fraction by PLAX is 24 %. The left ventricle has severely decreased function. The left ventricle demonstrates global hypokinesis. The left ventricular internal  cavity size was moderately dilated. Left ventricular diastolic parameters  are consistent with Grade II diastolic dysfunction (pseudonormalization).  2. Right ventricular systolic function is normal. The right ventricular size is normal. There is mildly elevated pulmonary artery systolic pressure.  3. Left atrial size was severely dilated.  4. The mitral valve is degenerative. Trivial mitral valve regurgitation. No evidence of mitral stenosis. The mean mitral valve gradient is 3.0 mmHg with average heart rate of 70 bpm.  5. The aortic valve is tricuspid. There is mild calcification of the aortic valve. There is mild thickening of the aortic valve. Aortic valve regurgitation is moderate. Aortic valve sclerosis is present, with no evidence of aortic valve stenosis. Aortic  regurgitation PHT measures 311 msec.  6. The inferior vena cava is normal in size with <50% respiratory variability, suggesting right atrial pressure of 8 mmHg. FINDINGS  Left Ventricle: Left ventricular ejection fraction, by estimation, is 20 to 25%. Left ventricular ejection fraction by PLAX is 24 %. The left ventricle has severely decreased function. The left ventricle demonstrates global hypokinesis. The left ventricular internal cavity size was moderately dilated. There is no left ventricular hypertrophy. Left ventricular diastolic parameters are consistent with Grade II diastolic dysfunction (pseudonormalization). Indeterminate filling pressures.  LV Wall Scoring: The mid and distal anterior wall, mid and distal anterior septum, entire apex, mid anterolateral segment, and mid inferoseptal segment are akinetic. The inferior wall, posterior wall, basal anteroseptal segment, basal anterolateral segment, basal anterior segment, and basal inferoseptal segment are hypokinetic. Right Ventricle: The right ventricular size is normal. No increase in right ventricular wall thickness. Right ventricular systolic function is normal. There is mildly elevated pulmonary artery systolic pressure. The tricuspid regurgitant velocity is  2.98  m/s, and with an assumed right atrial pressure of 8 mmHg, the estimated right ventricular systolic pressure is 96.0 mmHg. Left Atrium: Left atrial size was severely dilated. Right Atrium: Right atrial size was normal in size. Pericardium: There is no evidence of pericardial effusion. Mitral Valve: The mitral valve is degenerative in appearance. Trivial mitral valve regurgitation. No evidence of mitral valve stenosis. The mean mitral valve gradient is 3.0 mmHg with average heart rate of 70 bpm. Tricuspid Valve: The tricuspid valve is normal in structure. Tricuspid valve regurgitation is mild . No evidence of tricuspid stenosis. Aortic Valve: The aortic valve is tricuspid. There is mild calcification of the aortic valve. There is mild thickening of the aortic valve. Aortic valve regurgitation is moderate. Aortic regurgitation PHT measures 311 msec. Aortic valve sclerosis is present, with no evidence of aortic valve stenosis. Aortic valve mean gradient measures 8.0 mmHg. Aortic valve peak gradient measures 16.3 mmHg. Aortic valve area, by VTI measures 1.08 cm. Pulmonic Valve: The pulmonic valve was normal in structure. Pulmonic valve regurgitation is not visualized. No evidence of pulmonic stenosis. Aorta: The aortic root is normal in size and structure. Venous: The inferior vena cava is normal in size with less than 50% respiratory variability, suggesting right atrial pressure of 8 mmHg. IAS/Shunts: No atrial level shunt detected by color flow Doppler. Additional Comments: A device lead is visualized.  LEFT VENTRICLE PLAX 2D LV EF:         Left            Diastology                ventricular     LV e' medial:    5.70 cm/s  ejection        LV E/e' medial:  13.5                fraction by     LV e' lateral:   7.88 cm/s                PLAX is 24      LV E/e' lateral: 9.8                %. LVIDd:         6.30 cm LVIDs:         5.60 cm LV PW:         1.00 cm LV IVS:        0.80 cm LVOT diam:     1.70  cm LV SV:         37 LV SV Index:   28 LVOT Area:     2.27 cm  RIGHT VENTRICLE            IVC RV Basal diam:  3.90 cm    IVC diam: 1.20 cm RV S prime:     8.93 cm/s TAPSE (M-mode): 1.3 cm LEFT ATRIUM             Index        RIGHT ATRIUM           Index LA diam:        4.50 cm 3.35 cm/m   RA Area:     15.10 cm LA Vol (A2C):   69.9 ml 52.08 ml/m  RA Volume:   37.10 ml  27.64 ml/m LA Vol (A4C):   44.3 ml 33.01 ml/m LA Biplane Vol: 55.7 ml 41.50 ml/m  AORTIC VALVE AV Area (Vmax):    1.30 cm AV Area (Vmean):   1.13 cm AV Area (VTI):     1.08 cm AV Vmax:           202.00 cm/s AV Vmean:          124.000 cm/s AV VTI:            0.341 m AV Peak Grad:      16.3 mmHg AV Mean Grad:      8.0 mmHg LVOT Vmax:         116.00 cm/s LVOT Vmean:        61.600 cm/s LVOT VTI:          0.163 m LVOT/AV VTI ratio: 0.48 AI PHT:            311 msec  AORTA Ao Root diam: 2.50 cm MITRAL VALVE               TRICUSPID VALVE MV Area (PHT): 5.54 cm    TR Peak grad:   35.5 mmHg MV Mean grad:  3.0 mmHg    TR Vmax:        298.00 cm/s MV Decel Time: 137 msec MV E velocity: 77.20 cm/s  SHUNTS MV A velocity: 48.50 cm/s  Systemic VTI:  0.16 m MV E/A ratio:  1.59        Systemic Diam: 1.70 cm Skeet Latch MD Electronically signed by Skeet Latch MD Signature Date/Time: 12/11/2021/12:04:26 PM    Final      Assessment and Plan:   Acute on Chronic Combined CHF Patient presented with shortness of breath, orthopnea, and severe abdominal/flank pain for the past 2 days. BNP elevated at 2,294. Chest x-ray showed small right pleural effusion (unchanged  from prior exam) but no focal pneumonia or pulmonary edema. Echo this admission shows LVEF of 20-25% with global hypokinesis, grade 2 diastolic dysfunction, moderate AI, and mildly elevated PASP.  - Does not appear significantly volume overloaded on exam.  - On Torsemide 29m three times daily at home as well as Metolazone 2.540mas needed. She has received 2 doses of IV Lasix 8086mere so  far.  - Will continue IV Lasix 69m66mice daily but will give one dose of Metolazone 2.5mg 42more evening dose of Lasix tonight. - Continue home Spironolactone 12.5mg d74my, Ivabradine 7.5mg tw57m daily, and Farxiga 10mg da97m Not on Entresto due to hyperkalemia with this in the past. Not on beta-blocker due to intolerance with low output.  - Continue monitor daily weights, strict I/Os, and renal function.  - Per last CHF office visit note: Not a candidate for advanced therapies with her degree of lung disease. Also not a candidate for BATwire study for the same. She was seen by Dr. Brabam [facility]ene [facility].  CAD s/p CABG S/p CABG in 2019 with subsequent PCI in 2020. High-sensitivity troponin minimally elevated and flat at 29 >> 30 consistent with demand ischemia. EKG shows no acute ischemic changes. - She notes vague chest pain but it does not sound like angina. Suspect this is due to volume overload. - Continue aspirin and statin. - Do not anticipate any additional ischemic evaluation.  Paroxysmal Atrial Fibrillation History of paroxysmal atrial fibrillation in setting of lung cancer. Maintaining sinus rhythm. - Continue Ivabradine as above. - No longer on anticoagulation given hemoptysis in 10/2020. Bronchoscopy at that time showed no obvious source of bleeding. Given renal infarct, may need to consider retrying DOAC. Previously on Xarelto, could consider trying Eliquis.   Hyperlipidemia - Continue Lipitor 69mg dai48m  Type 2 Diabetes Mellitus Hemoglobin A1c 5.7 in 09/2021.  - Management per primary team.  Renal Infarct Presented with flank pain. CT was suggestive of right renal infarct. Most likely etiology felt to be known atrial fibrillation. - Currently on IV Heparin.  - Management per primary team.  Prolonged Qtc  QTc 525 ms on EKG on admission which looks slightly longer than usual. - Potassium 3.4 on admission and 3.6 today. Will  supplement with diuresis. - Magnesium 2.0. - Will repeat EKG.   Risk Assessment/Risk Scores:    New York Heart Association (NYHA) Functional Class NYHA Class IV  CHA2DS2-VASc Score = 6  his indicates a 9.7% annual risk of stroke. The patient's score is based upon: CHF History: 1 HTN History: 0 Diabetes History: 1 Stroke History: 2 (Renal infarct) Vascular Disease History: 1 Age Score: 0 Gender Score: 1   For questions or updates, please contact [email or web address] [email or web address] for contact info under    Signed, Hayden Mabin E Darreld Mclean/12/2021 2:27 PM

## 2021-12-12 ENCOUNTER — Other Ambulatory Visit (HOSPITAL_COMMUNITY): Payer: Self-pay

## 2021-12-12 ENCOUNTER — Inpatient Hospital Stay (HOSPITAL_COMMUNITY): Payer: BC Managed Care – PPO

## 2021-12-12 DIAGNOSIS — I5043 Acute on chronic combined systolic (congestive) and diastolic (congestive) heart failure: Secondary | ICD-10-CM

## 2021-12-12 LAB — COMPREHENSIVE METABOLIC PANEL
ALT: 59 U/L — ABNORMAL HIGH (ref 0–44)
AST: 61 U/L — ABNORMAL HIGH (ref 15–41)
Albumin: 3.9 g/dL (ref 3.5–5.0)
Alkaline Phosphatase: 77 U/L (ref 38–126)
Anion gap: 15 (ref 5–15)
BUN: 16 mg/dL (ref 8–23)
CO2: 26 mmol/L (ref 22–32)
Calcium: 9.6 mg/dL (ref 8.9–10.3)
Chloride: 94 mmol/L — ABNORMAL LOW (ref 98–111)
Creatinine, Ser: 1.41 mg/dL — ABNORMAL HIGH (ref 0.44–1.00)
GFR, Estimated: 42 mL/min — ABNORMAL LOW (ref >60)
Glucose, Bld: 106 mg/dL — ABNORMAL HIGH (ref 70–99)
Potassium: 3.4 mmol/L — ABNORMAL LOW (ref 3.5–5.1)
Sodium: 135 mmol/L (ref 135–145)
Total Bilirubin: 2.1 mg/dL — ABNORMAL HIGH (ref 0.3–1.2)
Total Protein: 7.1 g/dL (ref 6.5–8.1)

## 2021-12-12 LAB — CBC
HCT: 47.4 % — ABNORMAL HIGH (ref 36.0–46.0)
Hemoglobin: 15.5 g/dL — ABNORMAL HIGH (ref 12.0–15.0)
MCH: 30.7 pg (ref 26.0–34.0)
MCHC: 32.7 g/dL (ref 30.0–36.0)
MCV: 93.9 fL (ref 80.0–100.0)
Platelets: 148 10*3/uL — ABNORMAL LOW (ref 150–400)
RBC: 5.05 MIL/uL (ref 3.87–5.11)
RDW: 21.2 % — ABNORMAL HIGH (ref 11.5–15.5)
WBC: 12.8 10*3/uL — ABNORMAL HIGH (ref 4.0–10.5)
nRBC: 0 % (ref 0.0–0.2)

## 2021-12-12 LAB — URINALYSIS, COMPLETE (UACMP) WITH MICROSCOPIC
Bilirubin Urine: NEGATIVE
Glucose, UA: 50 mg/dL — AB
Ketones, ur: NEGATIVE mg/dL
Nitrite: NEGATIVE
Protein, ur: NEGATIVE mg/dL
Specific Gravity, Urine: 1.006 (ref 1.005–1.030)
pH: 6 (ref 5.0–8.0)

## 2021-12-12 LAB — HEPARIN LEVEL (UNFRACTIONATED): Heparin Unfractionated: 0.35 IU/mL (ref 0.30–0.70)

## 2021-12-12 LAB — MAGNESIUM: Magnesium: 1.9 mg/dL (ref 1.7–2.4)

## 2021-12-12 LAB — GLUCOSE, CAPILLARY
Glucose-Capillary: 118 mg/dL — ABNORMAL HIGH (ref 70–99)
Glucose-Capillary: 131 mg/dL — ABNORMAL HIGH (ref 70–99)
Glucose-Capillary: 149 mg/dL — ABNORMAL HIGH (ref 70–99)
Glucose-Capillary: 178 mg/dL — ABNORMAL HIGH (ref 70–99)

## 2021-12-12 LAB — URINE CULTURE

## 2021-12-12 LAB — PROCALCITONIN: Procalcitonin: 0.15 ng/mL

## 2021-12-12 MED ORDER — POTASSIUM CHLORIDE CRYS ER 20 MEQ PO TBCR: 40.0000 meq | EXTENDED_RELEASE_TABLET | Freq: Once | ORAL | Status: DC

## 2021-12-12 MED ORDER — ADULT MULTIVITAMIN W/MINERALS CH
1.0000 | ORAL_TABLET | Freq: Every day | ORAL | Status: DC
  Administered 2021-12-12 – 2021-12-14 (×3): 1 via ORAL
  Filled 2021-12-12 (×3): qty 1

## 2021-12-12 MED ORDER — APIXABAN 5 MG PO TABS
5.0000 mg | ORAL_TABLET | Freq: Two times a day (BID) | ORAL | Status: DC
  Administered 2021-12-12 – 2021-12-14 (×5): 5 mg via ORAL
  Filled 2021-12-12 (×5): qty 1

## 2021-12-12 MED ORDER — ENSURE ENLIVE PO LIQD
237.0000 mL | Freq: Three times a day (TID) | ORAL | Status: DC
  Administered 2021-12-12 – 2021-12-14 (×4): 237 mL via ORAL

## 2021-12-12 MED ORDER — GUAIFENESIN-DM 100-10 MG/5ML PO SYRP
5.0000 mL | ORAL_SOLUTION | ORAL | Status: DC | PRN
  Administered 2021-12-12 – 2021-12-13 (×2): 5 mL via ORAL
  Filled 2021-12-12 (×2): qty 5

## 2021-12-12 NOTE — Progress Notes (Signed)
°   12/11/21 2350  Assess: MEWS Score  Temp (!) 101.5 F (38.6 C)  BP (!) 112/53  Pulse Rate 84  ECG Heart Rate 83  Resp 17  SpO2 94 %  O2 Device Nasal Cannula  O2 Flow Rate (L/min) 3.5 L/min  Assess: MEWS Score  MEWS Temp 2  MEWS Systolic 0  MEWS Pulse 0  MEWS RR 0  MEWS LOC 0  MEWS Score 2  MEWS Score Color Yellow  Assess: if the MEWS score is Yellow or Red  Were vital signs taken at a resting state? Yes  Focused Assessment No change from prior assessment  Early Detection of Sepsis Score *See Row Information* Medium  MEWS guidelines implemented *See Row Information* Yes  Treat  MEWS Interventions Escalated (See documentation below)  Take Vital Signs  Increase Vital Sign Frequency  Yellow: Q 2hr X 2 then Q 4hr X 2, if remains yellow, continue Q 4hrs  Escalate  MEWS: Escalate Yellow: discuss with charge nurse/RN and consider discussing with provider and RRT  Notify: Charge Nurse/RN  Name of Charge Nurse/RN Notified Sharee Pimple RN  Date Charge Nurse/RN Notified 12/12/21  Time Charge Nurse/RN Notified 0021  Document  Patient Outcome Not stable and remains on department  Progress note created (see row info) Yes

## 2021-12-12 NOTE — TOC Initial Note (Addendum)
Transition of Care [facility]) - Initial/Assessment Note    Patient Details  Name: Brittany [name] MRN: [id number] Date of Birth: 29-Sep-1961  [facility] CM/SW Contact:    Brittany Rasher, RN Phone Number: [phone] 12/12/2021, 4:58 PM  Clinical Narrative:                  HF TOC CM spoke to pt at bedside. Pt states she lives at home alone. States she does have oxygen concentrator but currently not working. She could not recall provider. States it was [facility] up at [facility], will follow up with coordinator for information.   Will reach out to [place]. [employer] states pt had oxygen in 2019, but it was picked up. Will check to see if pt will qualify for oxygen prior to dc.    Expected Discharge Plan: Home/Self Care Barriers to Discharge: Continued Medical Work up   Patient Goals and CMS Choice        Expected Discharge Plan and Services Expected Discharge Plan: Home/Self Care   Discharge Planning Services: CM Consult   Living arrangements for the past 2 months: Single Family Home   Prior Living Arrangements/Services Living arrangements for the past 2 months: Single Family Home Lives with:: Self Patient language and need for interpreter reviewed:: Yes Do you feel safe going back to the place where you live?: Yes      Need for Family Participation in Patient Care: No (Comment) Care giver support system in place?: No (comment) Current home services:  (oxygen, rollator) Criminal Activity/Legal Involvement Pertinent to Current Situation/Hospitalization: No - Comment as needed  Activities of Daily Living Home Assistive Devices/Equipment: Walker (specify type) ADL Screening (condition at time of admission) Patient's cognitive ability adequate to safely complete daily activities?: Yes Is the patient deaf or have difficulty hearing?: No Does the patient have difficulty seeing, even when wearing glasses/contacts?: No Does the patient have  difficulty concentrating, remembering, or making decisions?: No Patient able to express need for assistance with ADLs?: Yes Does the patient have difficulty dressing or bathing?: No Independently performs ADLs?: Yes (appropriate for developmental age) Does the patient have difficulty walking or climbing stairs?: No Weakness of Legs: None Weakness of Arms/Hands: Right  Permission Sought/Granted Permission sought to share information with : Case Manager, Family Supports, PCP Permission granted to share information with : Yes, Verbal Permission Granted  Share Information with NAME: Brittany [name]  Permission granted to share info w AGENCY: DME  Permission granted to share info w Relationship: sister  Permission granted to share info w Contact Information: [phone]  Emotional Assessment Appearance:: Appears stated age Attitude/Demeanor/Rapport: Gracious Affect (typically observed): Accepting Orientation: : Oriented to Self, Oriented to Place, Oriented to  Time, Oriented to Situation   Psych Involvement: No (comment)  Admission diagnosis:  Renal infarct ([place]) [N28.0] CHF exacerbation ([place]) [I50.9] Acute on chronic systolic congestive heart failure (HCC) [I50.23] Acute on chronic systolic CHF (congestive heart failure) (HCC) [I50.23] Patient Active Problem List   Diagnosis Date Noted   Acute on chronic systolic CHF (congestive heart failure) ([place]) 12/11/2021   CHF exacerbation ([place]) 12/10/2021   Renal infarct ([place]) 12/10/2021   COPD exacerbation ([place]) 09/29/2021   Mixed diabetic hyperlipidemia associated with type 2 diabetes mellitus ([place]) 09/29/2021   Chronic kidney disease, stage 3a ([place]) 09/29/2021   Unstable angina ([place]) 02/09/2021   Hemoptysis 10/20/2020   Type 2 diabetes mellitus with stage 3a chronic kidney disease, without long-term current use of insulin (  [place]) 10/20/2020   Acute on chronic combined systolic and diastolic CHF (congestive heart failure) ([place]) 05/21/2020    Ischemic cardiomyopathy 04/12/2020   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   NSTEMI (non-ST elevated myocardial infarction) ([place]) 02/12/2019   CHF (congestive heart failure) ([place]) 11/24/2018   Recurrent pleural effusion on right 05/05/2018   Chronic respiratory failure with hypoxia (HCC)    S/P CABG (coronary artery bypass graft) 02/03/2018   Asthma    Anxiety    Allergy    HCAP (healthcare-associated pneumonia) 03/15/2015   Chest pain 03/15/2015   Small cell lung cancer ([place]) 03/16/2014   Protein-calorie malnutrition, severe ([place]) 03/14/2014   AF (paroxysmal atrial fibrillation) ([place]) 03/12/2014   Essential hypertension 05/07/2012   PCP:  Brittany Leriche, PA-C Pharmacy:   [id number] [place] [address] [place] [address] Phone: [phone] Fax: [phone]  [facility] [address] [place] [address] Phone: [phone] Fax: [phone]     Social Determinants of Health (SDOH) Interventions    Readmission Risk Interventions Readmission Risk Prevention Plan 04/11/2021  Transportation Screening Complete  PCP or Specialist Appt within 3-5 Days Complete  HRI or [place] Complete  Social Work Consult for [place] Planning/Counseling Complete  Palliative Care Screening Not Applicable  Medication Review Press [employer]) Complete  Some recent data might be hidden

## 2021-12-12 NOTE — Progress Notes (Signed)
Progress Note   Patient: Brittany [name] TIR:[id number] DOB: 08/19/1961 DOA: 12/10/2021     1 DOS: the patient was seen and examined on 12/12/2021   Brief hospital course: Brittany [name] is a 61 y.o. 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 Evaluation in the ED revealed likely decompensated CHF with significantly elevated BNP 2294, chest xray with stable small right pleural effusion and reports of SOB/DOE.  CT abdomen/pelvis showed a right renal infarct but no other acute abnormalities.    Patient was given IV Lasix and started on heparin drip. Admitted to [facility] service with cardiology consulted.  Assessment and Plan * Acute on chronic combined systolic and diastolic CHF (congestive heart failure) ([place])- (present on admission) Last echo in March of this year with EF less than 20% and grade 2 diastolic dysfunction with normal RV function.  Multiple admissions for decompensations. Presented with shortness of breath and cough typical of her CHF exacerbation.  BNP elevated to greater than 2200 in the ED.  Chest x-ray stable.  right pleural effusion.  No edema, typically does not typically get edema with her CHF exacerbations. --Cardiology consulted Follows with advanced heart failure team. Awaiting insurance approval for Barostim in outpatient setting --Telemetry --Started on Lasix 80 mg IV BID, diuresis per cardiology --Monitor renal functions and electrolytes --Strict I/O's & daily weights --Continue home spironolactone, ivabradine, Farxiga  Renal infarct [facility]) Presented with right flank pain and CT abdomen/pelvis showing  changed consistent with renal infarct.   1/2: still having bouts of severe right flank pain --IV heparin --Transition to Eliquis if tolerating --Pain control --Monitor renal function  AF (paroxysmal atrial fibrillation) ([place])- (present on admission) Not on anticoagulation prior to admission due to history of hemoptysis. Currently in sinus rhythm. --Telemetry --On IV heparin due to renal infarct --Likely transition to Eliquis with close monitoring for bleeding complications if she tolerates heparin  Mixed diabetic hyperlipidemia associated with type 2 diabetes mellitus ([place])- (present on admission) Continue statin  ICD (implantable cardioverter-defibrillator) in place- (present on admission) Due to ischemic cardiomyopathy with EF < 20%.   S/P CABG (coronary artery bypass graft) CAD stable, no chest pain. Continue ASA, statin Not on BB due to intolerance and low output  Essential hypertension- (present on admission) Continue spironolactone, also on Lasix  Chronic kidney disease, stage 3a ([place])- (present on admission) Renal function slightly worsened with diuresis.  Monitor closely. Minimize other nephrotoxins for diuresis given volume overload.  Type 2 diabetes mellitus with stage 3a chronic kidney disease, without long-term current use of insulin ([place])- (present on admission) On Farxiga.  Sliding scale Novolog.  Asthma- (present on admission) Mild wheezing on exam but not overtly exacerbated. Continue Duonebs. Low threshold to start steroids if wheezing is persistent.    Small cell lung cancer ([place])- (present on admission) S/p chemo and XRT     Subjective: Pt reports ongoing nausea and not tolerating PO intake. No abdominal pain, just queasy.  This was going on at home prior to admission.  Spiked fever last night.  No infectious complaints other than nausea.    Objective Vitals notable for Temp 101.5 F at 2350 last night, afebrile today.  BP soft with systolic  89-104.   On oxygen periodically.  General exam: awake, alert, no acute distress, chronically ill-appearing, appears fatigued Respiratory system: CTAB anteriorly, diminished bases, no wheezes, normal respiratory effort. Cardiovascular system: normal S1/S2, RRR, +JVP, no pedal edema.   Central nervous system: A&O x3. no gross focal neurologic deficits, normal speech Skin: warm, clammy, intact Psychiatry: normal mood, congruent affect, judgement and insight appear normal   Data Reviewed: Labs notable for K 3.4 (pt declined PO potassium replacement due to nausea), Cl 94, glucose 106, Cr 1.41, AST 61, ALT 59, Tbili 2.1, WBC 12.8k, Plts 148k, UA moderate Hbg, modereate Leuk's, negative nitrite, rare bacteria, 6-10 wbc's, 0-5 epi cells  Blood cultures pending CXR overnight stable, no acute findings, stable small R pleural effusion   Disposition: Status is: Inpatient  Remains inpatient appropriate because: Remains volume overloaded on IV diuresis, fever in past 24 hours         Time spent: 30 minutes  Author: Ezekiel Slocumb 12/12/2021 5:38 PM  For on call review [email or web address].

## 2021-12-12 NOTE — Progress Notes (Addendum)
Advanced Heart Failure Rounding Note  PCP-Cardiologist: Dr. 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 Also felt to be in a/c CHF and ACOPDE.  Subjective:    On heparin gtt. No Afib on tele. No recent symptoms of Afib.  Echo EF 20-25, RV normal   Spiked fever overnight, mTemp 101.5. WBC 9.3>>12.8. PCT 0.15. CXR negative for PNA. UA mod leukocytes, rare bacteria. Nitrite negative. BCx pending  2L in UOP yesterday w/ IV Lasix. Wt not charted today.   Bump in SCr 1.18>>1.41.  K 3.4   Feels tired. No current dyspnea. No current CP. Flank pain improving.  Nauseated.     Objective:   Weight Range: 42.4 kg Body mass index is 18.55 kg/m.   Vital Signs:   Temp:  [98.4 F (36.9 C)-101.5 F (38.6 C)] 98.6 F (37 C) (01/02 0758) Pulse Rate:  [66-84] 66 (01/02 0758) Resp:  [14-18] 16 (01/02 0758) BP: (89-112)/(50-60) 104/60 (01/02 0838) SpO2:  [91 %-100 %] 97 % (01/02 0758) Last BM Date: 12/10/21  Weight change: Filed Weights   12/11/21 0334  Weight: 42.4 kg    Intake/Output:   Intake/Output Summary (Last 24 hours) at 12/12/2021 0955 Last data filed at 12/12/2021 0205 Gross per 24 hour  Intake 304.39 ml  Output 1950 ml  Net -1645.61 ml      Physical Exam    General:  chronically ill appearing. Looks older than actual age. No distress  HEENT: Normal Neck: Supple. JVP 10 cm . Carotids 2+ bilat; no bruits. No lymphadenopathy or thyromegaly appreciated. Cor: PMI nondisplaced. Regular rate & rhythm. No rubs, gallops or murmurs. Lungs: Clear Abdomen: Soft, nontender, nondistended. No hepatosplenomegaly. No bruits or masses. Good bowel sounds. Extremities: No cyanosis,  clubbing, rash, edema Neuro: Alert & orientedx3, cranial nerves grossly intact. moves all 4 extremities w/o difficulty. Affect pleasant   Telemetry   NSR 80s   EKG    Admit EKG NSR 81 bpm, BAE, pulmonary disease pattern   Labs    CBC Recent Labs    12/10/21 1456 12/11/21 0500 12/12/21 0021  WBC 10.1 9.3 12.8*  NEUTROABS 8.8*  --   --   HGB 16.0* 15.1* 15.5*  HCT 48.4* 45.8 47.4*  MCV 94.2 94.8 93.9  PLT 170 156 665*   Basic Metabolic Panel Recent Labs    12/10/21 2234 12/11/21 0500 12/12/21 0021  NA  --  138 135  K  --  3.6 3.4*  CL  --  105 94*  CO2  --  28 26  GLUCOSE  --  108* 106*  BUN  --  12 16  CREATININE  --  1.18* 1.41*  CALCIUM  --  9.0 9.6  MG 2.0  --  1.9   Liver Function Tests Recent Labs    12/11/21 0500 12/12/21 0021  AST 94* 61*  ALT 61* 59*  ALKPHOS 78 77  BILITOT 1.4* 2.1*  PROT 6.4* 7.1  ALBUMIN 3.5 3.9   Recent Labs    12/10/21 1456  LIPASE 27   Cardiac Enzymes No results for input(s): CKTOTAL, CKMB, CKMBINDEX, TROPONINI in the last 72 hours.  BNP: BNP (last 3 results) Recent Labs  05/11/21 1512 09/28/21 2327 12/10/21 1653  BNP 836.0* 1,864.0* 2,294.6*    ProBNP (last 3 results) Recent Labs    07/19/21 0945  PROBNP 3,162*     D-Dimer No results for input(s): DDIMER in the last 72 hours. Hemoglobin A1C No results for input(s): HGBA1C in the last 72 hours. Fasting Lipid Panel No results for input(s): CHOL, HDL, LDLCALC, TRIG, CHOLHDL, LDLDIRECT in the last 72 hours. Thyroid Function Tests No results for input(s): TSH, T4TOTAL, T3FREE, THYROIDAB in the last 72 hours.  Invalid input(s): FREET3  Other results:   Imaging    DG Chest Port 1 View  Result Date: 12/12/2021 CLINICAL DATA:  Acute on chronic systolic and diastolic heart failure. Fevers. EXAM: PORTABLE CHEST 1 VIEW COMPARISON:  12/10/2021 FINDINGS: Left chest wall ICD is noted with lead in the right ventricle. Previous median sternotomy and  CABG procedure. Stable chronic blunting of the right costophrenic angle compatible with small effusion. Right lung perihilar masslike architectural distortion is again noted compatible with changes of external beam radiation. No interstitial edema or airspace consolidation. IMPRESSION: 1. No acute cardiopulmonary abnormalities. 2. Stable small right pleural effusion. Electronically Signed   By: Kerby Moors M.D.   On: 12/12/2021 07:23   ECHOCARDIOGRAM COMPLETE  Result Date: 12/11/2021    ECHOCARDIOGRAM REPORT   Patient Name:   Brittany [name] Date of Exam: 12/11/2021 Medical Rec #:  [id number]               Height:       59.5 in Accession #:    [id number]              Weight:       93.4 lb Date of Birth:  [birth year]               BSA:          1.342 m Patient Age:    52 years                BP:           110/62 mmHg Patient Gender: F                       HR:           71 bpm. Exam Location:  Inpatient Procedure: 2D Echo, Cardiac Doppler, Color Doppler and Intracardiac            Opacification Agent Indications:    CHF  History:        Patient has prior history of Echocardiogram examinations, most                 recent 10/21/2020. CHF, CAD, Prior CABG; Risk                 Factors:Hypertension and Diabetes.  Sonographer:    Merrie Roof RDCS Referring Phys: [id number] [place]  1. Left ventricular ejection fraction, by estimation, is 20 to 25%. Left ventricular ejection fraction by PLAX is 24 %. The left ventricle has severely decreased function. The left ventricle demonstrates global hypokinesis. The left ventricular internal  cavity size was moderately dilated. Left ventricular diastolic parameters are consistent with Grade II diastolic dysfunction (pseudonormalization).  2. Right ventricular systolic function is normal. The right ventricular size is normal. There is mildly elevated pulmonary artery systolic pressure.  3. Left atrial size was severely dilated.  4. The mitral valve is  degenerative. Trivial mitral valve regurgitation. No  evidence of mitral stenosis. The mean mitral valve gradient is 3.0 mmHg with average heart rate of 70 bpm.  5. The aortic valve is tricuspid. There is mild calcification of the aortic valve. There is mild thickening of the aortic valve. Aortic valve regurgitation is moderate. Aortic valve sclerosis is present, with no evidence of aortic valve stenosis. Aortic  regurgitation PHT measures 311 msec.  6. The inferior vena cava is normal in size with <50% respiratory variability, suggesting right atrial pressure of 8 mmHg. FINDINGS  Left Ventricle: Left ventricular ejection fraction, by estimation, is 20 to 25%. Left ventricular ejection fraction by PLAX is 24 %. The left ventricle has severely decreased function. The left ventricle demonstrates global hypokinesis. The left ventricular internal cavity size was moderately dilated. There is no left ventricular hypertrophy. Left ventricular diastolic parameters are consistent with Grade II diastolic dysfunction (pseudonormalization). Indeterminate filling pressures.  LV Wall Scoring: The mid and distal anterior wall, mid and distal anterior septum, entire apex, mid anterolateral segment, and mid inferoseptal segment are akinetic. The inferior wall, posterior wall, basal anteroseptal segment, basal anterolateral segment, basal anterior segment, and basal inferoseptal segment are hypokinetic. Right Ventricle: The right ventricular size is normal. No increase in right ventricular wall thickness. Right ventricular systolic function is normal. There is mildly elevated pulmonary artery systolic pressure. The tricuspid regurgitant velocity is 2.98  m/s, and with an assumed right atrial pressure of 8 mmHg, the estimated right ventricular systolic pressure is 67.6 mmHg. Left Atrium: Left atrial size was severely dilated. Right Atrium: Right atrial size was normal in size. Pericardium: There is no evidence of pericardial effusion.  Mitral Valve: The mitral valve is degenerative in appearance. Trivial mitral valve regurgitation. No evidence of mitral valve stenosis. The mean mitral valve gradient is 3.0 mmHg with average heart rate of 70 bpm. Tricuspid Valve: The tricuspid valve is normal in structure. Tricuspid valve regurgitation is mild . No evidence of tricuspid stenosis. Aortic Valve: The aortic valve is tricuspid. There is mild calcification of the aortic valve. There is mild thickening of the aortic valve. Aortic valve regurgitation is moderate. Aortic regurgitation PHT measures 311 msec. Aortic valve sclerosis is present, with no evidence of aortic valve stenosis. Aortic valve mean gradient measures 8.0 mmHg. Aortic valve peak gradient measures 16.3 mmHg. Aortic valve area, by VTI measures 1.08 cm. Pulmonic Valve: The pulmonic valve was normal in structure. Pulmonic valve regurgitation is not visualized. No evidence of pulmonic stenosis. Aorta: The aortic root is normal in size and structure. Venous: The inferior vena cava is normal in size with less than 50% respiratory variability, suggesting right atrial pressure of 8 mmHg. IAS/Shunts: No atrial level shunt detected by color flow Doppler. Additional Comments: A device lead is visualized.  LEFT VENTRICLE PLAX 2D LV EF:         Left            Diastology                ventricular     LV e' medial:    5.70 cm/s                ejection        LV E/e' medial:  13.5                fraction by     LV e' lateral:   7.88 cm/s                PLAX  is 24      LV E/e' lateral: 9.8                %. LVIDd:         6.30 cm LVIDs:         5.60 cm LV PW:         1.00 cm LV IVS:        0.80 cm LVOT diam:     1.70 cm LV SV:         37 LV SV Index:   28 LVOT Area:     2.27 cm  RIGHT VENTRICLE            IVC RV Basal diam:  3.90 cm    IVC diam: 1.20 cm RV S prime:     8.93 cm/s TAPSE (M-mode): 1.3 cm LEFT ATRIUM             Index        RIGHT ATRIUM           Index LA diam:        4.50 cm 3.35 cm/m    RA Area:     15.10 cm LA Vol (A2C):   69.9 ml 52.08 ml/m  RA Volume:   37.10 ml  27.64 ml/m LA Vol (A4C):   44.3 ml 33.01 ml/m LA Biplane Vol: 55.7 ml 41.50 ml/m  AORTIC VALVE AV Area (Vmax):    1.30 cm AV Area (Vmean):   1.13 cm AV Area (VTI):     1.08 cm AV Vmax:           202.00 cm/s AV Vmean:          124.000 cm/s AV VTI:            0.341 m AV Peak Grad:      16.3 mmHg AV Mean Grad:      8.0 mmHg LVOT Vmax:         116.00 cm/s LVOT Vmean:        61.600 cm/s LVOT VTI:          0.163 m LVOT/AV VTI ratio: 0.48 AI PHT:            311 msec  AORTA Ao Root diam: 2.50 cm MITRAL VALVE               TRICUSPID VALVE MV Area (PHT): 5.54 cm    TR Peak grad:   35.5 mmHg MV Mean grad:  3.0 mmHg    TR Vmax:        298.00 cm/s MV Decel Time: 137 msec MV E velocity: 77.20 cm/s  SHUNTS MV A velocity: 48.50 cm/s  Systemic VTI:  0.16 m MV E/A ratio:  1.59        Systemic Diam: 1.70 cm Skeet Latch MD Electronically signed by Skeet Latch MD Signature Date/Time: 12/11/2021/12:04:26 PM    Final      Medications:     Scheduled Medications:  atorvastatin  80 mg Oral q1800   dapagliflozin propanediol  10 mg Oral QAC breakfast   furosemide  80 mg Intravenous BID   insulin aspart  0-9 Units Subcutaneous TID WC   ivabradine  7.5 mg Oral BID WC   potassium chloride  40 mEq Oral BID   sodium chloride flush  3 mL Intravenous Q12H   spironolactone  12.5 mg Oral Daily    Infusions:  heparin 850 Units/hr (12/11/21 2347)    PRN Medications: acetaminophen **OR**  acetaminophen, diazepam, HYDROcodone-acetaminophen, ipratropium-albuterol, nitroGLYCERIN, polyethylene glycol  Assessment/Plan   Acute on Chronic Systolic Heart Failure Due to ICM - Echo 09/04/18 ([facility]) LVEF 20%, Moderate AI, Mild MR, Mild TR, Severe LAE, RV mildly decreased.  - Echo 02/12/19: EF 25-30%, RV mildly reduced - Echo 11/21 EF 20-25% RV mild HK. - Echo 2022 EF < 20% RV normal - S/p MDT Single Chamber ICD  - Admitted w/ a/c CHF. BNP  2,294. CXR + small pleural effusions. Fluid overloaded on exam. NYHA Class III. Echo this admit, EF 20 to 25%. RV normal  - Continue IV Lasix 80 mg bid - Continue Farxiga 10 mg daily  - Continue Spiro 12.5 mg daily  - Continue Corlanor 7.5 mg bid  - Failed Entresto due to hyperkalemia  - Not a candidate for advanced therapies with her degree of lung disease.  Also not a candidate for BATwire study for the same but we are trying to arrange commercial barostim.   - Saw Dr Radene Gunning and was approved for [facility] but waiting on insurance approval.     2.  Rt Renal Infarct - noted on CT of A/P (does not appear acute) - ? Cardioembolic from PAF. Previously taken off a/c given hemoptysis. TTE w/ no visible thrombus  - now on heparin gtt - given h/o bleed. May need to consider Watchman's device   3. ACODPE - per primary team   4. Fever /possible UTI  - mTemp 101.5. WBC 12.8. CXR - for PNA. PCT 0.15.  - has blood and WBCS in urine. May be related to renal infarct. Check UCx  - follow BCx   5. PAF - in NSR on admit EKG and on tele - ? If recent occurrence leading to renal infarct - monitor on tele - now on heparin gtt - will need to monitor for bleeding given h/o hemoptysis. May need to consider Watchman device  - keep K > 4.0 and Mg > 2.0   6. CAD - Hx of NSTEMI/STEMI s/p Emergent CABG 02/03/18: Cath with severe 2v CAD as above with severe LV dysfunction/ICM. s/p Emergent CABG 02/03/18.   - Had NSTEMI and now s/p LHC 02/12/19 with DES to the ostial ramus extending into the distal left main. - no CP. HS trop 29>>30 - continue medical therapy  - on statin. No ASA given need for a/c and bleed history   7. Hypokalemia -K 3.4 -Supp w/ KCl  -follow w/ diuresis   8. 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  - Chest CT 02/19/18 with mass-like consolidation in R hilum concerning for recurrent tumor.  - Repeat CT 03/27/2018 -No definite findings of locally recurrent tumor in the  right lung. 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 Continued chest CT surveillance is advised in 3-6 months. - S/p thoracentesis 04/2018. Cytology negative for malignancy.  - CTA 11/19 stable scarring in the right hilum consistent with emphysema. Bronchoscopy on 11/15, no obvious source of bleeding  9. Recurrent pleural effusions  - s/p pleurodesis at [facility] - resolved, CXR this admit w/ only small rt effusion   10. Chronic Hypoxic Respiratory Failure - multifactorial 2/2 CHF and COPD. On home O2 PRN - treatment outlined above - followed by outpatient pulmonology    11. Stage IIIa CKD  - SCr 1.4 c/w baseline  - follow w/ diuresis    Length of Stay: 1  Brittainy Simmons, PA-C  12/12/2021, 9:55 AM  Advanced Heart Failure Team Pager [phone] (M-F; 7a - 5p)  Please contact [facility] for night-coverage after hours (5p -7a ) and weekends on [email or web address]  Patient seen and examined with the above-signed Advanced Practice Provider and/or Housestaff. I personally reviewed laboratory data, imaging studies and relevant notes. I independently examined the patient and formulated the important aspects of the plan. I have edited the note to reflect any of my changes or salient points. I have personally discussed the plan with the patient and/or family.  Continues to feel weak. Poor appetite. Had low grade fever overnight. Denies SOB. Mild flank plain. On heparin. No bleeding  General:  Lying in bed. Thin weak No resp difficulty HEENT: normal Neck: supple. no JVD. Carotids 2+ bilat; no bruits. No lymphadenopathy or thryomegaly appreciated. Cor: PMI laterally displaced. Regular rate & rhythm. No rubs, gallops or murmurs. Lungs: minimal crackles. No wheeze Abdomen: soft, nontender, nondistended. No hepatosplenomegaly. No bruits or masses. Good bowel sounds. Extremities: no cyanosis, clubbing, rash, edema Neuro: alert &  orientedx3, cranial nerves grossly intact. moves all 4 extremities w/o difficulty. Affect pleasant  I have reviewed all notes, CT and echo as well as labs.  Clinical course most c/w acute right renal infarct. Suspect this may be related to silent PAF (none on tele here) but also at risk for LV thrombus with akinetic LV apex. There is no obvious LV clot on echo with Definity. She clearly warrant systemic AC but this is complicated by h/o hemoptysis at site of previous lung CA last year. She has not had recurrent bleeding on heparin.   Will switch to Eliquis 5 bid. Given small size and mild CKD may be candidate for dose reduction after a few weeks. D/w PharmD. WiIl discuss with Watchman team but given that clot may have originated from LV apex this may not provide adequate protection. We discussed risks/indications for anticoagulation and we will proceed. Will need Zio monitor at d/c.   No evidence of current HF flare or COPD flare on today's exam so can stop IV lasix for now. Check ReDS in am.   Glori Bickers, MD  11:05 AM

## 2021-12-12 NOTE — Progress Notes (Signed)
Mobility Specialist Progress Note    12/12/21 1704  Mobility  Activity Ambulated in hall  Level of Assistance Standby assist, set-up cues, supervision of patient - no hands on  Assistive Device Front wheel walker  Distance Ambulated (ft) 275 ft  Mobility Ambulated with assistance in hallway  Mobility Response Tolerated fair  Mobility performed by Mobility specialist  $Mobility charge 1 Mobility   Pt received in bed and agreeable. Ambulated on RA and SpO2 maintained high 90s. Left in bed with call bell in reach and NT present.   [facility] Mobility Specialist  M.S. Primary Phone: [phone] M.S. Secondary Phone: [phone]

## 2021-12-12 NOTE — Progress Notes (Addendum)
ANTICOAGULATION CONSULT NOTE   Pharmacy Consult for Heparin Indication:  renal infarct , afib  Allergies  Allergen Reactions   Codeine Nausea And Vomiting    Patient Measurements: Weight: 42.4 kg (93 lb 6.4 oz) Heparin Dosing Weight: 46.8 kg  Vital Signs: Temp: 98.6 F (37 C) (01/02 0758) Temp Source: Oral (01/02 0758) BP: 104/60 (01/02 0838) Pulse Rate: 66 (01/02 0758)  Labs: Recent Labs    12/10/21 1456 12/10/21 1456 12/10/21 1650 12/10/21 2234 12/11/21 0500 12/11/21 1346 12/11/21 2035 12/12/21 0021  HGB 16.0*  --   --   --  15.1*  --   --  15.5*  HCT 48.4*  --   --   --  45.8  --   --  47.4*  PLT 170  --   --   --  156  --   --  148*  LABPROT  --   --   --  13.6  --   --   --   --   INR  --   --   --  1.0  --   --   --   --   HEPARINUNFRC  --    < >  --   --  0.55 0.40 0.34 0.35  CREATININE 1.19*  --   --   --  1.18*  --   --  1.41*  TROPONINIHS 29*  --  30*  --   --   --   --   --    < > = values in this interval not displayed.     Estimated Creatinine Clearance: 28 mL/min (A) (by C-G formula based on SCr of 1.41 mg/dL (H)).   Medical History: Past Medical History:  Diagnosis Date   Acute respiratory failure ([place]) 05/24/4314   Acute systolic congestive heart failure ([place]) 02/03/2018   Allergy    Anxiety    Asthma    DM2 (diabetes mellitus, type 2) ([place]) 10/20/2020   Hypertension    PAF (paroxysmal atrial fibrillation) ([facility])    Prophylactic measure 08/03/14-08/19/14   Prophyl. 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   LIMA to LAD, SVG to D1, SVG to OM1, EVH via right thigh with implantation of Impella LD LVAD via direct aortic approach   Small cell lung cancer ([place]) 03/16/2014    Medications:  Medications Prior to Admission  Medication Sig Dispense Refill Last Dose   albuterol (VENTOLIN HFA) 108 (90 Base) MCG/ACT inhaler Inhale 2 puffs into the lungs every 6 (six) hours as needed for wheezing or shortness of breath. 1 Inhaler 0 Past  Week   aspirin EC 81 MG tablet Take 1 tablet (81 mg total) by mouth daily. Swallow whole. 30 tablet 3 12/09/2021   atorvastatin (LIPITOR) 80 MG tablet Take 1 tablet (80 mg total) by mouth daily at 6 PM. 30 tablet 1 12/09/2021   CORLANOR 7.5 MG TABS tablet TAKE 1 TABLET (7.5 MG TOTAL) BY MOUTH 2 (TWO) TIMES DAILY WITH A MEAL. (Patient taking differently: Take 7.5 mg by mouth 2 (two) times daily with a meal.) 180 tablet 1 12/09/2021   FARXIGA 10 MG TABS tablet TAKE 1 TABLET (10 MG TOTAL) BY MOUTH DAILY BEFORE BREAKFAST. (Patient taking differently: Take 10 mg by mouth daily before breakfast.) 30 tablet 6 12/09/2021   Glucerna (GLUCERNA) LIQD Take 237 mLs by mouth daily in the afternoon.   12/09/2021   ipratropium-albuterol (DUONEB) 0.5-2.5 (3) MG/3ML SOLN Take 3 mLs by  nebulization 2 (two) times daily as needed (for shortness of breath).   12/08/2021   metolazone (ZAROXOLYN) 2.5 MG tablet Take 2.5 mg by mouth daily as needed (edema).   Past Month   nitroGLYCERIN (NITROSTAT) 0.4 MG SL tablet Place 1 tablet (0.4 mg total) under the tongue every 5 (five) minutes as needed for chest pain. 90 tablet 5 unk   potassium chloride SA (KLOR-CON) 20 MEQ tablet Take 40 mEq by mouth daily.   12/09/2021   spironolactone (ALDACTONE) 25 MG tablet Take 0.5 tablets (12.5 mg total) by mouth daily. 45 tablet 3 12/09/2021   torsemide (DEMADEX) 20 MG tablet TAKE 3 TABLETS BY MOUTH EVERY DAY (Patient taking differently: Take 20 mg by mouth 3 (three) times daily.) 270 tablet 1 12/09/2021   ALPRAZolam (XANAX) 0.25 MG tablet Take 1 tablet (0.25 mg total) by mouth daily as needed for anxiety. (Patient not taking: Reported on 12/10/2021) 10 tablet 0 Not Taking   blood glucose meter kit and supplies KIT Dispense based on patient and insurance preference. Use up to four times daily as directed. 1 each 0    predniSONE (DELTASONE) 10 MG tablet Take 4 tablets daily X 2 days, then, Take 3 tablets daily X 2 days, then, Take 2 tablets daily  X 2 days, then, Take 1 tablets daily X 1 day. (Patient not taking: Reported on 12/10/2021) 19 tablet 0 Completed Course   Assessment: 70 yof with a history of COPD/asthma, CHF, CKD 3A, diabetes, hyperlipidemia, atrial fibrillation, anxiety, hypertension, CAD status post CABG, small cell lung cancer. Patient not on anticoagulation prior to admission. Patient is presenting with AMS, SOB, abdominal and back pain. Heparin per pharmacy consult placed for  renal infarct .  Heparin level at goal this morning at 0.35, cbc stable. No signs or symptoms of bleeding. New orders to transition to apixaban. D/w cardiology and they do not want 7 day load at this time.   No current need for dose reduction for afib. Age <80, scr <1.5. Weight is low at 42kg.   Goal of Therapy:  Heparin level 0.3-0.7 units/ml Monitor platelets by anticoagulation protocol: Yes   Plan:  Transition to apixaban 93m bid this morning Check cbc in am Copay check in process  FErin HearingPharmD., BCPS Clinical Pharmacist 12/12/2021 11:10 AM

## 2021-12-12 NOTE — TOC Benefit Eligibility Note (Signed)
Patient Teacher, English as a foreign language completed.    The patient is currently admitted and upon discharge could be taking Eliquis 5 mg.  The current 30 day co-pay is, $45.00.   The patient is insured through [employer], [place] Patient Advocate Specialist [place] Patient Advocate Team Direct Number: [phone]  Fax: [phone]

## 2021-12-12 NOTE — Progress Notes (Signed)
Initial Nutrition Assessment  DOCUMENTATION CODES:   Not applicable  INTERVENTION:   Ensure Enlive po TID, each supplement provides 350 kcal and 20 grams of protein  MVI with minerals daily.  NUTRITION DIAGNOSIS:   Increased nutrient needs related to chronic illness (COPD, CHF) as evidenced by estimated needs.  GOAL:   Patient will meet greater than or equal to 90% of their needs  MONITOR:   PO intake, Supplement acceptance  REASON FOR ASSESSMENT:   Malnutrition Screening Tool    ASSESSMENT:   61 yo female admitted with N/V, R flank pain. CT A/P revealed right renal infarct.  PMH includes CHF, CAD, COPD, home oxygen, PAF, previous smoker, HTN, remote lung cancer 2015, DM.  Patient c/o decreased appetite, poor intake, and weight loss PTA.  Weight history reviewed. Patient with 10% weight loss within the past 3 months, which is significant for the time frame.   Labs reviewed. K 3.4 CBG: 118-131  Medications reviewed and include Novolog, potassium chloride, spironolactone.  Currently on a heart healthy diet with 1800 ml fluid restriction. Meal intakes not recorded.   Suspect patient is malnourished. Unable to obtain enough information at this time for identification of malnutrition.   NUTRITION - FOCUSED PHYSICAL EXAM:  Unable to complete, RD working remotely   Diet Order:   Diet Order             Diet Heart Room service appropriate? Yes; Fluid consistency: Thin; Fluid restriction: 1800 mL Fluid  Diet effective now                   EDUCATION NEEDS:   Not appropriate for education at this time  Skin:  Skin Assessment: Reviewed RN Assessment  Last BM:  12/31  Height:   Ht Readings from Last 1 Encounters:  08/29/21 4' 11.5" (1.511 m)    Weight:   Wt Readings from Last 1 Encounters:  12/11/21 42.4 kg    BMI:  Body mass index is 18.55 kg/m.  Estimated Nutritional Needs:   Kcal:  1400-1600  Protein:  65-80 gm  Fluid:  1.4-1.6  L    Lucas Mallow RD, LDN, CNSC Please refer to Amion for contact information.

## 2021-12-12 NOTE — Progress Notes (Signed)
TRH night cross cover note:  I was notified by RN that patient is febrile, with current temperature of 101.5, which appears new.  Additionally, the patient is refusing Tylenol, noting a history of nausea in response to this medication.   In terms of alternate antipyretics, considered prn NSAID, however, ultimately decided against this possibility as further chart review revealed that she is here for acutely decompensated heart failure.   As this fever appears new, I have ordered blood cultures x2, urinalysis, chest x-ray as well as procalcitonin.     Babs Bertin, DO Hospitalist

## 2021-12-13 LAB — CBC
HCT: 47.3 % — ABNORMAL HIGH (ref 36.0–46.0)
Hemoglobin: 15.1 g/dL — ABNORMAL HIGH (ref 12.0–15.0)
MCH: 29.8 pg (ref 26.0–34.0)
MCHC: 31.9 g/dL (ref 30.0–36.0)
MCV: 93.5 fL (ref 80.0–100.0)
Platelets: 159 10*3/uL (ref 150–400)
RBC: 5.06 MIL/uL (ref 3.87–5.11)
RDW: 20.5 % — ABNORMAL HIGH (ref 11.5–15.5)
WBC: 7.2 10*3/uL (ref 4.0–10.5)
nRBC: 0 % (ref 0.0–0.2)

## 2021-12-13 LAB — BASIC METABOLIC PANEL
Anion gap: 13 (ref 5–15)
BUN: 28 mg/dL — ABNORMAL HIGH (ref 8–23)
CO2: 27 mmol/L (ref 22–32)
Calcium: 9.6 mg/dL (ref 8.9–10.3)
Chloride: 93 mmol/L — ABNORMAL LOW (ref 98–111)
Creatinine, Ser: 1.57 mg/dL — ABNORMAL HIGH (ref 0.44–1.00)
GFR, Estimated: 37 mL/min — ABNORMAL LOW (ref >60)
Glucose, Bld: 119 mg/dL — ABNORMAL HIGH (ref 70–99)
Potassium: 3.4 mmol/L — ABNORMAL LOW (ref 3.5–5.1)
Sodium: 133 mmol/L — ABNORMAL LOW (ref 135–145)

## 2021-12-13 LAB — GLUCOSE, CAPILLARY
Glucose-Capillary: 126 mg/dL — ABNORMAL HIGH (ref 70–99)
Glucose-Capillary: 199 mg/dL — ABNORMAL HIGH (ref 70–99)
Glucose-Capillary: 204 mg/dL — ABNORMAL HIGH (ref 70–99)
Glucose-Capillary: 115 mg/dL — ABNORMAL HIGH (ref 70–99)

## 2021-12-13 LAB — MAGNESIUM: Magnesium: 2.4 mg/dL (ref 1.7–2.4)

## 2021-12-13 MED ORDER — TRAMADOL HCL 50 MG PO TABS: 100.0000 mg | ORAL_TABLET | Freq: Two times a day (BID) | ORAL | Status: DC | PRN

## 2021-12-13 MED ORDER — POTASSIUM CHLORIDE 20 MEQ PO PACK
40.0000 meq | PACK | Freq: Once | ORAL | Status: AC
  Administered 2021-12-13: 40 meq via ORAL
  Filled 2021-12-13: qty 2

## 2021-12-13 NOTE — Plan of Care (Signed)

## 2021-12-13 NOTE — Progress Notes (Addendum)
Advanced Heart Failure Rounding Note  PCP-Cardiologist: Dr. 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 Also felt to be in a/c CHF and ACOPDE.  Subjective:    Echo EF 20-25%, AK LV apex, no obvious LV clot with Definity. RV normal   No Afib on tele.   Now on Eliquis. Hgb stable, 15. No bleeding.   Fever resolved w/o tx. WBC  12>>7K. PCT 0.15. BCx pending.   Slight bump in SCr, 1.57 today. Baseline ~1.2-1.4. Diuretics on hold.    Dyspnea improved. No current supp O2 requirements. Still w/ rt sided flank pain but improving. No further nausea but not eating much.   Objective:   Weight Range: 44.6 kg Body mass index is 19.52 kg/m.   Vital Signs:   Temp:  [97.8 F (36.6 C)-98.9 F (37.2 C)] 98.1 F (36.7 C) (01/03 0633) Pulse Rate:  [58-73] 73 (01/03 0633) Resp:  [15-17] 15 (01/03 0633) BP: (91-114)/(57-69) 114/69 (01/03 0633) SpO2:  [92 %-100 %] 92 % (01/03 0633) Weight:  [44.6 kg] 44.6 kg (01/03 0633) Last BM Date: 12/10/21  Weight change: Filed Weights   12/11/21 0334 12/13/21 0633  Weight: 42.4 kg 44.6 kg    Intake/Output:   Intake/Output Summary (Last 24 hours) at 12/13/2021 2409 Last data filed at 12/13/2021 0659 Gross per 24 hour  Intake 120 ml  Output 850 ml  Net -730 ml      Physical Exam    General:  fatigue/ thin appearing. No respiratory difficulty HEENT: normal Neck: supple. JVD 8 cm. Carotids 2+ bilat; no bruits. No lymphadenopathy or thyromegaly appreciated. Cor: PMI nondisplaced. Regular rate & rhythm. No rubs, gallops or murmurs. Lungs: clear Abdomen: soft, nontender, nondistended. No hepatosplenomegaly. No bruits or masses. Good bowel  sounds. Extremities: no cyanosis, clubbing, rash, edema Neuro: alert & oriented x 3, cranial nerves grossly intact. moves all 4 extremities w/o difficulty. Affect pleasant.  Telemetry   NSR 70s-80s. No Afib   EKG    Admit EKG NSR 81 bpm, BAE, pulmonary disease pattern   Labs    CBC Recent Labs    12/10/21 1456 12/11/21 0500 12/12/21 0021 12/13/21 0345  WBC 10.1   < > 12.8* 7.2  NEUTROABS 8.8*  --   --   --   HGB 16.0*   < > 15.5* 15.1*  HCT 48.4*   < > 47.4* 47.3*  MCV 94.2   < > 93.9 93.5  PLT 170   < > 148* 159   < > = values in this interval not displayed.   Basic Metabolic Panel Recent Labs    12/12/21 0021 12/13/21 0345  NA 135 133*  K 3.4* 3.4*  CL 94* 93*  CO2 26 27  GLUCOSE 106* 119*  BUN 16 28*  CREATININE 1.41* 1.57*  CALCIUM 9.6 9.6  MG 1.9 2.4   Liver Function Tests Recent Labs    12/11/21 0500 12/12/21 0021  AST 94* 61*  ALT 61* 59*  ALKPHOS 78 77  BILITOT 1.4* 2.1*  PROT 6.4* 7.1  ALBUMIN 3.5 3.9   Recent Labs    12/10/21 1456  LIPASE 27   Cardiac Enzymes No results for input(s): CKTOTAL, CKMB,  CKMBINDEX, TROPONINI in the last 72 hours.  BNP: BNP (last 3 results) Recent Labs    05/11/21 1512 09/28/21 2327 12/10/21 1653  BNP 836.0* 1,864.0* 2,294.6*    ProBNP (last 3 results) Recent Labs    07/19/21 0945  PROBNP 3,162*     D-Dimer No results for input(s): DDIMER in the last 72 hours. Hemoglobin A1C No results for input(s): HGBA1C in the last 72 hours. Fasting Lipid Panel No results for input(s): CHOL, HDL, LDLCALC, TRIG, CHOLHDL, LDLDIRECT in the last 72 hours. Thyroid Function Tests No results for input(s): TSH, T4TOTAL, T3FREE, THYROIDAB in the last 72 hours.  Invalid input(s): FREET3  Other results:   Imaging    No results found.   Medications:     Scheduled Medications:  apixaban  5 mg Oral BID   atorvastatin  80 mg Oral q1800   dapagliflozin propanediol  10 mg Oral QAC breakfast   feeding  supplement  237 mL Oral TID BM   insulin aspart  0-9 Units Subcutaneous TID WC   ivabradine  7.5 mg Oral BID WC   multivitamin with minerals  1 tablet Oral Daily   potassium chloride  40 mEq Oral Once   sodium chloride flush  3 mL Intravenous Q12H   spironolactone  12.5 mg Oral Daily    Infusions:    PRN Medications: acetaminophen **OR** acetaminophen, diazepam, guaiFENesin-dextromethorphan, HYDROcodone-acetaminophen, ipratropium-albuterol, nitroGLYCERIN, polyethylene glycol  Assessment/Plan   Acute on Chronic Systolic Heart Failure Due to ICM - Echo 09/04/18 ([facility]) LVEF 20%, Moderate AI, Mild MR, Mild TR, Severe LAE, RV mildly decreased.  - Echo 02/12/19: EF 25-30%, RV mildly reduced - Echo 11/21 EF 20-25% RV mild HK. - Echo 2022 EF < 20% RV normal - S/p MDT Single Chamber ICD  - Admitted w/ a/c CHF. BNP 2,294. CXR + small pleural effusions. Echo this admit, EF 20 to 25%. LV apex AK. RV normal  - Diuresed w/ IV Lasix. Dyspnea improved. Not grossly fluid overloaded on exam. IV lasix on hold. Check ReDs.  - Continue Farxiga 10 mg daily  - Continue Spiro 12.5 mg daily  - Continue Corlanor 7.5 mg bid  - Failed Entresto due to hyperkalemia  - Not a candidate for advanced therapies with her degree of lung disease.  Also not a candidate for BATwire study for the same but we are trying to arrange commercial barostim.   - Saw Dr Trula Slade and was approved for [facility] but waiting on insurance approval.     2.  Rt Renal Infarct - noted on CT of A/P (does not appear acute) - ? Cardioembolic from PAF vs LV thrombus. Previously taken off a/c given hemoptysis. TTE w/ no visible thrombus  - now on Eliquis    3. ACODPE - breathing improved. No wheezing on exam  - per primary team   4. FUO - spiked temp Temp 101.5 on 1/2  - now resolved w/o tx  - WBC 12.8>>7K. CXR - for PNA. PCT 0.15.  - Blood and UCx pending  - no indication for abx currently   5. PAF - in NSR on admit EKG and on  tele - ? If recent occurrence leading to renal infarct - monitor on tele - now Eliquis 5 mg bid  - Given small size and mild CKD may be candidate for dose reduction after a few weeks. Will d/w pharmacy. WiIl discuss with Watchman team but given that clot may have originated from LV apex this may not provide adequate  protection. - Will need Zio monitor at discharge  - keep K > 4.0 and Mg > 2.0   6. CAD - Hx of NSTEMI/STEMI s/p Emergent CABG 02/03/18: Cath with severe 2v CAD as above with severe LV dysfunction/ICM. s/p Emergent CABG 02/03/18.   - Had NSTEMI and now s/p LHC 02/12/19 with DES to the ostial ramus extending into the distal left main. - no CP. HS trop 29>>30 - continue medical therapy  - on statin. No ASA given need for a/c and bleed history   7. Hypokalemia -K 3.4 -Supp w/ KCl   8. 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  - Chest CT 02/19/18 with mass-like consolidation in R hilum concerning for recurrent tumor.  - Repeat CT 03/27/2018 -No definite findings of locally recurrent tumor in the right lung. 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 Continued chest CT surveillance is advised in 3-6 months. - S/p thoracentesis 04/2018. Cytology negative for malignancy.  - CTA 11/19 stable scarring in the right hilum consistent with emphysema. Bronchoscopy on 11/15, no obvious source of bleeding  9. Recurrent pleural effusions  - s/p [facility] at [facility] - resolved, CXR this admit w/ only small rt effusion   10. Chronic Hypoxic Respiratory Failure - multifactorial 2/2 CHF and COPD. On home O2 PRN - treatment outlined above - followed by outpatient pulmonology    11. AKI on Stage IIIa CKD  - SCr baseline ~1.2-1.4  - 1.6 today  - hold diuretics   Length of Stay: 2  Lyda Jester, PA-C  12/13/2021, 7:12 AM  Advanced Heart Failure Team Pager [phone] (M-F; 7a - 5p)  Please contact [place]  [facility] for night-coverage after hours (5p -7a ) and weekends on [email or web address]  Patient seen and examined with the above-signed Advanced Practice Provider and/or Housestaff. I personally reviewed laboratory data, imaging studies and relevant notes. I independently examined the patient and formulated the important aspects of the plan. I have edited the note to reflect any of my changes or salient points. I have personally discussed the plan with the patient and/or family.  Continues with R flank pain but seems improved. Now AF. On Apixaban. Volume status improved. Lasix held with AKI.   General:  Lying in bed No resp difficulty HEENT: normal Neck: supple. no JVD. Carotids 2+ bilat; no bruits. No lymphadenopathy or thryomegaly appreciated. Cor: PMI nondisplaced. Regular rate & rhythm. No rubs, gallops or murmurs. Lungs: clear with decreased BS Abdomen: soft, nontender, nondistended. No hepatosplenomegaly. No bruits or masses. Good bowel sounds. Extremities: no cyanosis, clubbing, rash, edema Neuro: alert & orientedx3, cranial nerves grossly intact. moves all 4 extremities w/o difficulty. Affect pleasant  Now on apixaban for acute renal infarct. I have reviewed with structural team and agree that likely not a Watchman candidate as substrate for embolism more likely to be from LV apex.  Continue apixaban. Hold lasix for now.   Glori Bickers, MD  8:42 AM

## 2021-12-13 NOTE — Progress Notes (Signed)
Mobility Specialist Progress Note    12/13/21 1529  Mobility  Activity Refused mobility   Pt stated she was feeling nauseous and weak. Will f/up as schedule permits.   [facility] Mobility Specialist  M.S. Primary Phone: [phone] M.S. Secondary Phone: [phone]

## 2021-12-13 NOTE — Progress Notes (Signed)
Heart Failure Navigator Progress Note  Assessed for Heart & Vascular TOC clinic readiness.  Patient does not meet criteria due to prior to admission pt established with AHF clinic, Dr. [name].   Navigator available for reassessment of patient.   Pricilla Holm, MSN, RN Heart Failure Nurse Navigator [phone]

## 2021-12-13 NOTE — Progress Notes (Signed)
Progress Note   Patient: Brittany [name] PRF:[id number] DOB: 1961/01/25 DOA: 12/10/2021     2 DOS: the patient was seen and examined on 12/13/2021   Brief hospital course: Brittany [name] is a 61 y.o. 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 Evaluation in the ED revealed likely decompensated CHF with significantly elevated BNP 2294, chest xray with stable small right pleural effusion and reports of SOB/DOE.  CT abdomen/pelvis showed a right renal infarct but no other acute abnormalities.    Patient was given IV Lasix and started on heparin drip. Admitted to [facility] service with cardiology consulted.  Assessment and Plan * Acute on chronic combined systolic and diastolic CHF (congestive heart failure) ([place])- (present on admission) Last echo in March of this year with EF less than 20% and grade 2 diastolic dysfunction with normal RV function.  Multiple admissions for decompensations. Presented with shortness of breath and cough typical of her CHF exacerbation.  BNP elevated to greater than 2200 in the ED.  Chest x-ray stable.  right pleural effusion.  No edema, typically does not typically get edema with her CHF exacerbations. --Cardiology consulted Follows with advanced heart failure team. Awaiting insurance approval for Barostim in outpatient setting --Telemetry --Treated with IV Lasix, now on hold with rise in Cr --Monitor renal functions and electrolytes --Strict I/O's & daily weights --Continue home spironolactone, ivabradine, Farxiga  Renal infarct [facility]) Presented with right flank pain and CT abdomen/pelvis showing changed  consistent with renal infarct.   1/3: right flank pain improving --Transitioned IV heparin >> Eliquis  --Pain control --Monitor renal function  AF (paroxysmal atrial fibrillation) ([place])- (present on admission) Prior to admission, not on anticoagulation prior to admission due to history of hemoptysis.  Currently in sinus rhythm. --Telemetry --Transitioned from IV heparin >> Eliquis   Mixed diabetic hyperlipidemia associated with type 2 diabetes mellitus ([place])- (present on admission) Continue statin  ICD (implantable cardioverter-defibrillator) in place- (present on admission) Due to ischemic cardiomyopathy with EF < 20%.   S/P CABG (coronary artery bypass graft) CAD stable, no chest pain. Continue ASA, statin Not on BB due to intolerance and low output  Essential hypertension- (present on admission) BP on soft side. Continue spironolactone, currently off Lasix  Chronic kidney disease, stage 3a ([place])- (present on admission) Renal function slightly worsened with diuresis.  Monitor closely. Diuretics on hold. Minimize other nephrotoxins.  Type 2 diabetes mellitus with stage 3a chronic kidney disease, without long-term current use of insulin ([place])- (present on admission) On Farxiga.  Sliding scale Novolog.  Asthma- (present on admission) Mild wheezing on exam but not overtly exacerbated. Continue Duonebs. Low threshold to start steroids if wheezing.  Small cell lung cancer ([place])- (present on admission) S/p chemo and XRT     Subjective: Pt awake resting in bed. Appears fatigued.  Says nausea less this AM but returns when she tries to eat anything.  No fever/chills.  Right flank pain better, she reports trying to avoid/minimize pain medication and needing it less.  Spikes in pain are less severe than they were.  Objective Vitals notable for soft BP with systolic 93'T to 701'X, stable MAP's.  On Room Air this AM.  Afebrile.  General exam: awake, alert, no acute distress,  frail, chronically ill-appearing Respiratory system: CTAB but generally diminished, normal respiratory effort. Cardiovascular system: normal S1/S2, RRR, no pedal edema.   Central nervous system: A&O x3. no gross focal neurologic deficits, normal speech Extremities: moves all , no edema, normal tone Psychiatry: normal mood, congruent affect, judgement and insight appear normal   Data Reviewed: Na 133, K 3.4, Cl 93, glucose 119, BUN 28, Cr 1.57    Disposition: Status is: Inpatient  Remains inpatient appropriate because: AKI monitoring renal function and volume status off diuretics, inadequate PO intake due to ongoing nausea         Time spent: 35 minutes  Author: Ezekiel Slocumb 12/13/2021 5:24 PM  For on call review [email or web address].

## 2021-12-14 ENCOUNTER — Other Ambulatory Visit (HOSPITAL_COMMUNITY): Payer: Self-pay

## 2021-12-14 ENCOUNTER — Ambulatory Visit (INDEPENDENT_AMBULATORY_CARE_PROVIDER_SITE_OTHER): Payer: BC Managed Care – PPO

## 2021-12-14 DIAGNOSIS — I480 Paroxysmal atrial fibrillation: Secondary | ICD-10-CM

## 2021-12-14 LAB — BASIC METABOLIC PANEL
Anion gap: 13 (ref 5–15)
BUN: 29 mg/dL — ABNORMAL HIGH (ref 8–23)
CO2: 24 mmol/L (ref 22–32)
Calcium: 10.1 mg/dL (ref 8.9–10.3)
Chloride: 97 mmol/L — ABNORMAL LOW (ref 98–111)
Creatinine, Ser: 1.25 mg/dL — ABNORMAL HIGH (ref 0.44–1.00)
GFR, Estimated: 49 mL/min — ABNORMAL LOW (ref >60)
Glucose, Bld: 118 mg/dL — ABNORMAL HIGH (ref 70–99)
Potassium: 4.5 mmol/L (ref 3.5–5.1)
Sodium: 134 mmol/L — ABNORMAL LOW (ref 135–145)

## 2021-12-14 LAB — GLUCOSE, CAPILLARY
Glucose-Capillary: 135 mg/dL — ABNORMAL HIGH (ref 70–99)
Glucose-Capillary: 157 mg/dL — ABNORMAL HIGH (ref 70–99)

## 2021-12-14 LAB — CBC
HCT: 48.7 % — ABNORMAL HIGH (ref 36.0–46.0)
Hemoglobin: 16 g/dL — ABNORMAL HIGH (ref 12.0–15.0)
MCH: 30.4 pg (ref 26.0–34.0)
MCHC: 32.9 g/dL (ref 30.0–36.0)
MCV: 92.4 fL (ref 80.0–100.0)
Platelets: 178 10*3/uL (ref 150–400)
RBC: 5.27 MIL/uL — ABNORMAL HIGH (ref 3.87–5.11)
RDW: 20.1 % — ABNORMAL HIGH (ref 11.5–15.5)
WBC: 7.4 10*3/uL (ref 4.0–10.5)
nRBC: 0 % (ref 0.0–0.2)

## 2021-12-14 MED ORDER — TORSEMIDE 20 MG PO TABS
40.0000 mg | ORAL_TABLET | Freq: Every day | ORAL | 1 refills | Status: DC
  Filled 2021-12-14: qty 60, 30d supply, fill #0

## 2021-12-14 MED ORDER — TORSEMIDE 20 MG PO TABS
40.0000 mg | ORAL_TABLET | Freq: Every day | ORAL | Status: DC
  Administered 2021-12-14: 40 mg via ORAL
  Filled 2021-12-14: qty 2

## 2021-12-14 MED ORDER — ADULT MULTIVITAMIN W/MINERALS CH: 1.0000 | ORAL_TABLET | Freq: Every day | ORAL | Status: DC

## 2021-12-14 MED ORDER — APIXABAN 5 MG PO TABS
5.0000 mg | ORAL_TABLET | Freq: Two times a day (BID) | ORAL | 1 refills | Status: DC
  Filled 2021-12-14: qty 60, 30d supply, fill #0

## 2021-12-14 NOTE — Plan of Care (Signed)

## 2021-12-14 NOTE — Progress Notes (Signed)
Gave pt eliquis coupon card. Reviewed d/c instructions, no questions or concerns.

## 2021-12-14 NOTE — TOC CM/SW Note (Signed)
SATURATION QUALIFICATIONS: (This note is used to comply with regulatory documentation for home oxygen)  Patient Saturations on Room Air at Rest = 96%  Patient Saturations on Room Air while Ambulating = 92-95%   Please briefly explain why patient needs home oxygen: COPD  HF TOC CM updated attending that her oxygen level did not desat while ambulating. [place], Heart Failure TOC CM [phone]

## 2021-12-14 NOTE — Discharge Summary (Signed)
Physician Discharge Summary  Brittany [name] OFB:[id number] DOB: [birth year]  PCP: Maude Leriche, PA-C  Admitted from: Home Discharged to: Home  Admit date: 12/10/2021 Discharge date: 12/14/2021  Recommendations for Outpatient Follow-up:    Follow-up Information     Bensimhon, Shaune Pascal, MD Follow up on 12/22/2021.   Specialty: Cardiology Why: at 1:40 Contact information: [address] [place] [address] [phone]         Scifres, [name], [place]. Schedule an appointment as soon as possible for a visit in 2 week(s).   Specialty: Physician Assistant Contact information: [place] [address] [phone]         Thompson Grayer, MD .   Specialty: Cardiology Contact information: [address] [place] [address] [place]: None    Equipment/Devices: None    Discharge Condition: Improved and stable.   Code Status: Full Code Diet recommendation:  Discharge Diet Orders (From admission, onward)     Start     Ordered   12/14/21 0000  Diet - low sodium heart healthy        12/14/21 1401   12/14/21 0000  Diet Carb Modified        12/14/21 1401             Discharge Diagnoses:  Principal Problem:   Acute on chronic combined systolic and diastolic CHF (congestive heart failure) (HCC) Active Problems:   Essential hypertension   AF (paroxysmal atrial fibrillation) (HCC)   Small cell lung cancer (HCC)   Asthma   S/P CABG (coronary artery bypass graft)   ICD (implantable cardioverter-defibrillator) in place   Type 2 diabetes mellitus with stage 3a chronic kidney disease, without long-term current use of insulin (HCC)   Mixed diabetic hyperlipidemia associated with type 2 diabetes mellitus ([place])   Chronic kidney disease, stage 3a ([place])   Renal infarct ([place])   Brief Summary: Brittany [name] is a 61 y.o. female with medical history  significant of COPD/asthma, chronic systolic CHF with EF < 09% followed by advanced heart failure team, CKD 3A, diabetes type II, hyperlipidemia, atrial fibrillation on aspirin prior to this admission, anxiety, hypertension, CAD status post CABG, small cell lung cancer who presented to the ED on 12/10/21 with shortness of breath, cough, abdominal and right flank pain with N/V and was not able to take her medications.  Admitted for acute on chronic systolic CHF due to ischemic cardiomyopathy.  Advanced heart failure cardiology was consulted.  Aggressively diuresed with IV Lasix initially which was then held due to rising creatinine. CT abdomen/pelvis showed a right renal infarct but no other acute abnormalities.  Briefly on heparin drip which was then transitioned to Eliquis.   Assessment and Plan  Acute on chronic systolic CHF due to ischemic cardiomyopathy. Last echo in March of this year with EF less than 20% and grade 2 diastolic dysfunction with normal RV function.  Multiple admissions for decompensations. Presented with shortness of breath and cough typical of her CHF exacerbation.  BNP elevated to greater than 2200 in the ED.  Chest x-ray stable.  right pleural effusion.  No edema, typically does not typically get edema with her CHF exacerbations. --Cardiology consulted, Follows with advanced heart failure team. Awaiting insurance approval for Barostim in outpatient setting --Treated with IV Lasix for about 48 hours, then on hold with rise in Cr.  -  2.2 L since admission.  Weight recording was inaccurate it appears. --Continue home spironolactone, ivabradine, Farxiga.  Reportedly failed Entresto due to hyperkalemia. -Cardiology has seen today, resumed torsemide 40 mg daily and cleared her for discharge home and have arranged close outpatient follow-up.  Labs can be performed during visit at the advanced heart failure office visit.. -Not a candidate for advanced therapies with her degree of lung  disease.  Also not a candidate for batt wire study for the same but Cardiology are trying to arrange commercial Barostim.  Has seen Dr. Trula Slade and was approved for [facility] but waiting on insurance approval.   Right renal infarct [facility]) Presented with right flank pain and CT abdomen/pelvis showing changes consistent with renal infarct.   --Transitioned IV heparin >> Eliquis  -Flank pain has resolved. -?  Cardioembolic from PAF versus LV thrombus.  As per cardiology, previously taken off anticoagulation because of hemoptysis.  TTE with no visible thrombus.  Now on Eliquis.  Aspirin thereby discontinued.  Hemoglobin stable.   AF (paroxysmal atrial fibrillation) ([place])- (present on admission) Prior to admission, not on anticoagulation prior to admission due to history of hemoptysis.  Currently in sinus rhythm. --Transitioned from IV heparin >> Eliquis -As per cardiology, given small size and mild CKD, may be a candidate for dose reduction of Eliquis after a few weeks and they will discuss with pharmacy.  We will also discussed with watchman team but given that clot may have originated from LV apex, this may not provide adequate protection.  Cardiology will also arrange for a Zio monitor at discharge.     Mixed diabetic hyperlipidemia associated with type 2 diabetes mellitus ([place])- (present on admission) Continue statin   ICD (implantable cardioverter-defibrillator) in place- (present on admission) Due to ischemic cardiomyopathy with EF < 20%.     CAD, S/P CABG (coronary artery bypass graft) Continue statin.  Aspirin discontinued because Eliquis was started this admission. Not on BB due to intolerance and low output Stable without anginal symptoms.   Essential hypertension- (present on admission) BP on soft side. Continue spironolactone, resumed torsemide.  Asymptomatic.   Chronic kidney disease, stage 3a ([place])- (present on admission) Renal function slightly worsened with diuresis.  IV  Lasix was held.  Creatinine improved and back to baseline.  Resuming torsemide at discharge.  Close follow-up of BMP as outpatient via the advanced heart failure clinic.  Baseline creatinine between 1.2-1.4   Type 2 diabetes mellitus with stage 3a chronic kidney disease, without long-term current use of insulin ([place])- (present on admission) On Farxiga.  Sliding scale Novolog.   COPD/asthma- (present on admission) No clinical bronchospasm at this time.  Stable.   Small cell lung cancer ([place])- (present on admission) S/p chemo and XRT.  Outpatient follow-up.  FUO Spiked temperature of 101.5 on 1/1.  No recurrence thereafter.  Mild leukocytosis of 12.8 has resolved.  Blood cultures x2: Negative to date.  Urine culture not helpful, multiple species, likely contaminant.  Procalcitonin minimal/0.15.  Chest x-ray without pneumonia.  No antimicrobials initiated.  Monitor.   Hypokalemia Has been replaced.  Close outpatient follow-up with repeat labs.  Chronic respiratory failure with hypoxia Has home oxygen but only uses it as needed.  Multifactorial due to CHF and COPD.  Also followed by outpatient pulmonology.  Not hypoxic here on room air.  Microscopic hematuria: Noted on urine microscopy 12/12/2021.  Patient asymptomatic of UTI symptoms.  CT abdomen and pelvis without acute findings.  Unsure if renal infarct can explain this.  Newly started on anticoagulation.  Recommend repeating urine microscopy in 2 to 3 weeks and if this persists or worsens then may need to consider further evaluation and management.  Consultations: Cardiology  Procedures: None   Discharge Instructions  Discharge Instructions     (HEART FAILURE PATIENTS) Call MD:  Anytime you have any of the following symptoms: 1) 3 pound weight gain in 24 hours or 5 pounds in 1 week 2) shortness of breath, with or without a dry hacking cough 3) swelling in the hands, feet or stomach 4) if you have to sleep on extra pillows at night in  order to breathe.   Complete by: As directed    Call MD for:  difficulty breathing, headache or visual disturbances   Complete by: As directed    Call MD for:  extreme fatigue   Complete by: As directed    Call MD for:  persistant dizziness or light-headedness   Complete by: As directed    Call MD for:  severe uncontrolled pain   Complete by: As directed    Diet - low sodium heart healthy   Complete by: As directed    Diet Carb Modified   Complete by: As directed    Increase activity slowly   Complete by: As directed         Medication List     STOP taking these medications    ALPRAZolam 0.25 MG tablet Commonly known as: XANAX   aspirin EC 81 MG tablet   predniSONE 10 MG tablet Commonly known as: DELTASONE       TAKE these medications    albuterol 108 (90 Base) MCG/ACT inhaler Commonly known as: VENTOLIN HFA Inhale 2 puffs into the lungs every 6 (six) hours as needed for wheezing or shortness of breath.   apixaban 5 MG Tabs tablet Commonly known as: ELIQUIS Take 1 tablet (5 mg total) by mouth 2 (two) times daily.   atorvastatin 80 MG tablet Commonly known as: LIPITOR Take 1 tablet (80 mg total) by mouth daily at 6 PM.   blood glucose meter kit and supplies Kit Dispense based on patient and insurance preference. Use up to four times daily as directed.   Corlanor 7.5 MG Tabs tablet Generic drug: ivabradine TAKE 1 TABLET (7.5 MG TOTAL) BY MOUTH 2 (TWO) TIMES DAILY WITH A MEAL. What changed: See the new instructions.   Farxiga 10 MG Tabs tablet Generic drug: dapagliflozin propanediol TAKE 1 TABLET (10 MG TOTAL) BY MOUTH DAILY BEFORE BREAKFAST. What changed: how much to take   Glucerna Liqd Take 237 mLs by mouth daily in the afternoon.   ipratropium-albuterol 0.5-2.5 (3) MG/3ML Soln Commonly known as: DUONEB Take 3 mLs by nebulization 2 (two) times daily as needed (for shortness of breath).   metolazone 2.5 MG tablet Commonly known as: ZAROXOLYN Take  2.5 mg by mouth daily as needed (edema).   multivitamin with minerals Tabs tablet Take 1 tablet by mouth daily. Start taking on: December 15, 2021   nitroGLYCERIN 0.4 MG SL tablet Commonly known as: NITROSTAT Place 1 tablet (0.4 mg total) under the tongue every 5 (five) minutes as needed for chest pain.   potassium chloride SA 20 MEQ tablet Commonly known as: KLOR-CON M Take 40 mEq by mouth daily.   spironolactone 25 MG tablet Commonly known as: ALDACTONE Take 0.5 tablets (12.5 mg total) by mouth daily.   torsemide 20 MG tablet Commonly known as: DEMADEX Take 2 tablets (40 mg total) by mouth daily.  Start taking on: December 15, 2021 What changed: how much to take       Allergies  Allergen Reactions   Codeine Nausea And Vomiting      Procedures/Studies: DG Chest 2 View  Result Date: 12/10/2021 CLINICAL DATA:  Cough and shortness of breath. EXAM: CHEST - 2 VIEW COMPARISON:  September 28, 2021 FINDINGS: The heart size and mediastinal contours are stable. Heart size is enlarged. Cardiac pacemaker is unchanged. Small right pleural effusion is identified unchanged compared prior exam. There is no focal pneumonia. Minimal scarring of the left lung base is unchanged. The visualized skeletal structures are unremarkable. IMPRESSION: Small right pleural effusion unchanged compared prior exam. No focal pneumonia or pulmonary edema. Electronically Signed   By: Abelardo Diesel M.D.   On: 12/10/2021 15:29   CT ABDOMEN PELVIS W CONTRAST  Result Date: 12/10/2021 CLINICAL DATA:  Right lower quadrant abdominal pain. Nausea vomiting. Productive cough and chills. EXAM: CT ABDOMEN AND PELVIS WITH CONTRAST TECHNIQUE: Multidetector CT imaging of the abdomen and pelvis was performed using the standard protocol following bolus administration of intravenous contrast. CONTRAST:  13m ISOVUE-370 IOPAMIDOL (ISOVUE-370) INJECTION 76% COMPARISON:  01/27/2020. FINDINGS: Lower chest: No acute findings. Stable  appearance from the prior CT. Hepatobiliary: Liver normal in size and overall attenuation. No liver mass or focal lesion. Gallbladder unremarkable. No bile duct dilation. Pancreas: Unremarkable. No pancreatic ductal dilatation or surrounding inflammatory changes. Spleen: Normal in size without focal abnormality. Adrenals/Urinary Tract: No adrenal masses. Focal hypoattenuation/lack of enhancement along the posterior aspect of the left kidney from the upper to the mid to lower pole, new from the prior CT. This is likely an area of interval renal infarction. No defined renal masses. No stones. No hydronephrosis. Ureters are normal in course and in caliber. Bladder is unremarkable. Stomach/Bowel: Unremarkable stomach. Small bowel and colon are normal in caliber. No wall thickening. No inflammation. Normal appendix visualized. Vascular/Lymphatic: Aortic atherosclerosis. No aneurysm. No enlarged lymph nodes. Reproductive: Status post hysterectomy. No adnexal masses. Other: No abdominal wall hernia or abnormality. No abdominopelvic ascites. Musculoskeletal: No acute or significant osseous findings. IMPRESSION: 1. Focal hypo attenuation and lack of enhancement along the posterior aspect of the right kidney, new from the prior CT, consistent with an area of interval infarction. This does not appear acute. 2. No convincing acute abnormality within the abdomen or pelvis. 3. No renal or ureteral stones or obstructive uropathy. 4. Aortic atherosclerosis. 5. Electronically Signed   By: DLajean ManesM.D.   On: 12/10/2021 19:50   DG Chest Port 1 View  Result Date: 12/12/2021 CLINICAL DATA:  Acute on chronic systolic and diastolic heart failure. Fevers. EXAM: PORTABLE CHEST 1 VIEW COMPARISON:  12/10/2021 FINDINGS: Left chest wall ICD is noted with lead in the right ventricle. Previous median sternotomy and CABG procedure. Stable chronic blunting of the right costophrenic angle compatible with small effusion. Right lung perihilar  masslike architectural distortion is again noted compatible with changes of external beam radiation. No interstitial edema or airspace consolidation. IMPRESSION: 1. No acute cardiopulmonary abnormalities. 2. Stable small right pleural effusion. Electronically Signed   By: TKerby MoorsM.D.   On: 12/12/2021 07:23   ECHOCARDIOGRAM COMPLETE  Result Date: 12/11/2021    ECHOCARDIOGRAM REPORT   Patient Name:   LZARAH CARBONDate of Exam: 12/11/2021 Medical Rec #:  [id number]              Height:       59.5 in Accession #:  [id number]              Weight:       93.4 lb Date of Birth:  06-11-1961               BSA:          1.342 m Patient Age:    61 years                BP:           110/62 mmHg Patient Gender: F                       HR:           71 bpm. Exam Location:  Inpatient Procedure: 2D Echo, Cardiac Doppler, Color Doppler and Intracardiac            Opacification Agent Indications:    CHF  History:        Patient has prior history of Echocardiogram examinations, most                 recent 10/21/2020. CHF, CAD, Prior CABG; Risk                 Factors:Hypertension and Diabetes.  Sonographer:    Merrie Roof RDCS Referring Phys: [id number] [place]  1. Left ventricular ejection fraction, by estimation, is 20 to 25%. Left ventricular ejection fraction by PLAX is 24 %. The left ventricle has severely decreased function. The left ventricle demonstrates global hypokinesis. The left ventricular internal  cavity size was moderately dilated. Left ventricular diastolic parameters are consistent with Grade II diastolic dysfunction (pseudonormalization).  2. Right ventricular systolic function is normal. The right ventricular size is normal. There is mildly elevated pulmonary artery systolic pressure.  3. Left atrial size was severely dilated.  4. The mitral valve is degenerative. Trivial mitral valve regurgitation. No evidence of mitral stenosis. The mean mitral valve gradient is 3.0 mmHg  with average heart rate of 70 bpm.  5. The aortic valve is tricuspid. There is mild calcification of the aortic valve. There is mild thickening of the aortic valve. Aortic valve regurgitation is moderate. Aortic valve sclerosis is present, with no evidence of aortic valve stenosis. Aortic  regurgitation PHT measures 311 msec.  6. The inferior vena cava is normal in size with <50% respiratory variability, suggesting right atrial pressure of 8 mmHg. FINDINGS  Left Ventricle: Left ventricular ejection fraction, by estimation, is 20 to 25%. Left ventricular ejection fraction by PLAX is 24 %. The left ventricle has severely decreased function. The left ventricle demonstrates global hypokinesis. The left ventricular internal cavity size was moderately dilated. There is no left ventricular hypertrophy. Left ventricular diastolic parameters are consistent with Grade II diastolic dysfunction (pseudonormalization). Indeterminate filling pressures.  LV Wall Scoring: The mid and distal anterior wall, mid and distal anterior septum, entire apex, mid anterolateral segment, and mid inferoseptal segment are akinetic. The inferior wall, posterior wall, basal anteroseptal segment, basal anterolateral segment, basal anterior segment, and basal inferoseptal segment are hypokinetic. Right Ventricle: The right ventricular size is normal. No increase in right ventricular wall thickness. Right ventricular systolic function is normal. There is mildly elevated pulmonary artery systolic pressure. The tricuspid regurgitant velocity is 2.98  m/s, and with an assumed right atrial pressure of 8 mmHg, the estimated right ventricular systolic pressure is 96.2 mmHg. Left Atrium: Left atrial size was severely dilated. Right Atrium: Right atrial size was  normal in size. Pericardium: There is no evidence of pericardial effusion. Mitral Valve: The mitral valve is degenerative in appearance. Trivial mitral valve regurgitation. No evidence of mitral valve  stenosis. The mean mitral valve gradient is 3.0 mmHg with average heart rate of 70 bpm. Tricuspid Valve: The tricuspid valve is normal in structure. Tricuspid valve regurgitation is mild . No evidence of tricuspid stenosis. Aortic Valve: The aortic valve is tricuspid. There is mild calcification of the aortic valve. There is mild thickening of the aortic valve. Aortic valve regurgitation is moderate. Aortic regurgitation PHT measures 311 msec. Aortic valve sclerosis is present, with no evidence of aortic valve stenosis. Aortic valve mean gradient measures 8.0 mmHg. Aortic valve peak gradient measures 16.3 mmHg. Aortic valve area, by VTI measures 1.08 cm. Pulmonic Valve: The pulmonic valve was normal in structure. Pulmonic valve regurgitation is not visualized. No evidence of pulmonic stenosis. Aorta: The aortic root is normal in size and structure. Venous: The inferior vena cava is normal in size with less than 50% respiratory variability, suggesting right atrial pressure of 8 mmHg. IAS/Shunts: No atrial level shunt detected by color flow Doppler. Additional Comments: A device lead is visualized.  LEFT VENTRICLE PLAX 2D LV EF:         Left            Diastology                ventricular     LV e' medial:    5.70 cm/s                ejection        LV E/e' medial:  13.5                fraction by     LV e' lateral:   7.88 cm/s                PLAX is 24      LV E/e' lateral: 9.8                %. LVIDd:         6.30 cm LVIDs:         5.60 cm LV PW:         1.00 cm LV IVS:        0.80 cm LVOT diam:     1.70 cm LV SV:         37 LV SV Index:   28 LVOT Area:     2.27 cm  RIGHT VENTRICLE            IVC RV Basal diam:  3.90 cm    IVC diam: 1.20 cm RV S prime:     8.93 cm/s TAPSE (M-mode): 1.3 cm LEFT ATRIUM             Index        RIGHT ATRIUM           Index LA diam:        4.50 cm 3.35 cm/m   RA Area:     15.10 cm LA Vol (A2C):   69.9 ml 52.08 ml/m  RA Volume:   37.10 ml  27.64 ml/m LA Vol (A4C):   44.3 ml 33.01  ml/m LA Biplane Vol: 55.7 ml 41.50 ml/m  AORTIC VALVE AV Area (Vmax):    1.30 cm AV Area (Vmean):   1.13 cm AV Area (VTI):     1.08 cm AV Vmax:  202.00 cm/s AV Vmean:          124.000 cm/s AV VTI:            0.341 m AV Peak Grad:      16.3 mmHg AV Mean Grad:      8.0 mmHg LVOT Vmax:         116.00 cm/s LVOT Vmean:        61.600 cm/s LVOT VTI:          0.163 m LVOT/AV VTI ratio: 0.48 AI PHT:            311 msec  AORTA Ao Root diam: 2.50 cm MITRAL VALVE               TRICUSPID VALVE MV Area (PHT): 5.54 cm    TR Peak grad:   35.5 mmHg MV Mean grad:  3.0 mmHg    TR Vmax:        298.00 cm/s MV Decel Time: 137 msec MV E velocity: 77.20 cm/s  SHUNTS MV A velocity: 48.50 cm/s  Systemic VTI:  0.16 m MV E/A ratio:  1.59        Systemic Diam: 1.70 cm Skeet Latch MD Electronically signed by Skeet Latch MD Signature Date/Time: 12/11/2021/12:04:26 PM    Final       Subjective: S1 as I walked in the room, patient indicated that the advanced heart failure team have seen her this morning and told her that she could go home.  She denies complaints and is anxious to be discharged.  Denies dyspnea, chest pain, dizziness, lightheadedness or any other complaints.  Reports feeling much better than she did yesterday.  Discharge Exam:  Vitals:   12/13/21 1700 12/13/21 1937 12/14/21 0616 12/14/21 0830  BP: 108/64 109/67 (!) 93/58   Pulse: 78 76 74 79  Resp: _0 Temp: 99.1 F (37.3 C) 98.6 F (37 C) 99 F (37.2 C)   TempSrc: Oral Oral Oral   SpO2: 94% 95% 92% 98%  Weight:        General: Middle-age female, small built, frail and chronically ill looking lying comfortably supine in bed without distress. Cardiovascular: S1 & S2 heard, RRR, S1/S2 +. No murmurs, rubs, gallops or clicks. No JVD or pedal edema.  Telemetry personally reviewed: Sinus rhythm. Respiratory: Clear to auscultation without wheezing, rhonchi or crackles. No increased work of breathing.  Midline sternotomy scar.  Left  upper anterior chest with AICD. Abdominal:  Non distended, non tender & soft. No organomegaly or masses appreciated. Normal bowel sounds heard. CNS: Alert and oriented. No focal deficits. Extremities: no edema, no cyanosis    The results of significant diagnostics from this hospitalization (including imaging, microbiology, ancillary and laboratory) are listed below for reference.     Microbiology: Recent Results (from the past 240 hour(s))  Resp Panel by RT-PCR (Flu A&B, Covid) Nasopharyngeal Swab     Status: None   Collection Time: 12/10/21  2:50 PM   Specimen: Nasopharyngeal Swab; Nasopharyngeal(NP) swabs in vial transport medium  Result Value Ref Range Status   SARS Coronavirus 2 by RT PCR NEGATIVE NEGATIVE Final    Comment: (NOTE) SARS-CoV-2 target nucleic acids are NOT DETECTED.  The SARS-CoV-2 RNA is generally detectable in upper respiratory specimens during the acute phase of infection. The lowest concentration of SARS-CoV-2 viral copies this assay can detect is 138 copies/mL. A negative result does not preclude SARS-Cov-2 infection and should not be used as the sole basis for  treatment or other patient management decisions. A negative result may occur with  improper specimen collection/handling, submission of specimen other than nasopharyngeal swab, presence of viral mutation(s) within the areas targeted by this assay, and inadequate number of viral copies(<138 copies/mL). A negative result must be combined with clinical observations, patient history, and epidemiological information. The expected result is Negative.  Fact Sheet for Patients:  EntrepreneurPulse.com.au  Fact Sheet for Healthcare Providers:  IncredibleEmployment.be  This test is no t yet approved or cleared by the [place] FDA and  has been authorized for detection and/or diagnosis of SARS-CoV-2 by FDA under an Emergency Use Authorization (EUA). This EUA will  remain  in effect (meaning this test can be used) for the duration of the COVID-19 declaration under Section 564(b)(1) of the Act, 21 U.S.C.section 360bbb-3(b)(1), unless the authorization is terminated  or revoked sooner.       Influenza A by PCR NEGATIVE NEGATIVE Final   Influenza B by PCR NEGATIVE NEGATIVE Final    Comment: (NOTE) The Xpert Xpress SARS-CoV-2/FLU/RSV plus assay is intended as an aid in the diagnosis of influenza from Nasopharyngeal swab specimens and should not be used as a sole basis for treatment. Nasal washings and aspirates are unacceptable for Xpert Xpress SARS-CoV-2/FLU/RSV testing.  Fact Sheet for Patients: EntrepreneurPulse.com.au  Fact Sheet for Healthcare Providers: IncredibleEmployment.be  This test is not yet approved or cleared by the [place] FDA and has been authorized for detection and/or diagnosis of SARS-CoV-2 by FDA under an Emergency Use Authorization (EUA). This EUA will remain in effect (meaning this test can be used) for the duration of the COVID-19 declaration under Section 564(b)(1) of the Act, 21 U.S.C. section 360bbb-3(b)(1), unless the authorization is terminated or revoked.  Performed at [facility], [place] [address] [place], [place] [address]   Urine Culture     Status: Abnormal   Collection Time: 12/10/21  7:29 PM   Specimen: Urine, Clean Catch  Result Value Ref Range Status   Specimen Description URINE, CLEAN CATCH  Final   Special Requests   Final    NONE Performed at [facility], [place] [address], [place], [place] [address]    Culture MULTIPLE SPECIES PRESENT, SUGGEST RECOLLECTION (A)  Final   Report Status 12/12/2021 FINAL  Final  Culture, blood (Routine X 2) w Reflex to ID Panel     Status: None (Preliminary result)   Collection Time: 12/12/21 12:21 AM   Specimen: BLOOD RIGHT HAND  Result Value Ref Range Status   Specimen Description BLOOD RIGHT HAND  Final    Special Requests   Final    BOTTLES DRAWN AEROBIC AND ANAEROBIC Blood Culture adequate volume   Culture   Final    NO GROWTH 2 DAYS Performed at [facility], [place] [address] [place], [place] [address]    Report Status PENDING  Incomplete  Culture, blood (Routine X 2) w Reflex to ID Panel     Status: None (Preliminary result)   Collection Time: 12/12/21 12:21 AM   Specimen: BLOOD LEFT HAND  Result Value Ref Range Status   Specimen Description BLOOD LEFT HAND  Final   Special Requests   Final    BOTTLES DRAWN AEROBIC AND ANAEROBIC Blood Culture adequate volume   Culture   Final    NO GROWTH 2 DAYS Performed at [facility], [place] [address], [place], [place] [address]    Report Status PENDING  Incomplete  Labs: CBC: Recent Labs  Lab 12/10/21 1456 12/11/21 0500 12/12/21 0021 12/13/21 0345 12/14/21 0241  WBC 10.1 9.3 12.8* 7.2 7.4  NEUTROABS 8.8*  --   --   --   --   HGB 16.0* 15.1* 15.5* 15.1* 16.0*  HCT 48.4* 45.8 47.4* 47.3* 48.7*  MCV 94.2 94.8 93.9 93.5 92.4  PLT 170 156 148* 159 771    Basic Metabolic Panel: Recent Labs  Lab 12/10/21 1456 12/10/21 2234 12/11/21 0500 12/12/21 0021 12/13/21 0345 12/14/21 0416  NA 138  --  138 135 133* 134*  K 3.4*  --  3.6 3.4* 3.4* 4.5  CL 102  --  105 94* 93* 97*  CO2 26  --  _0 GLUCOSE 151*  --  108* 106* 119* 118*  BUN 15  --  12 16 28* 29*  CREATININE 1.19*  --  1.18* 1.41* 1.57* 1.25*  CALCIUM 9.9  --  9.0 9.6 9.6 10.1  MG  --  2.0  --  1.9 2.4  --     Liver Function Tests: Recent Labs  Lab 12/10/21 1456 12/11/21 0500 12/12/21 0021  AST 69* 94* 61*  ALT 33 61* 59*  ALKPHOS 87 78 77  BILITOT 1.5* 1.4* 2.1*  PROT 7.3 6.4* 7.1  ALBUMIN 4.1 3.5 3.9    CBG: Recent Labs  Lab 12/13/21 1140 12/13/21 1658 12/13/21 2110 12/14/21 0722 12/14/21 1124  GLUCAP 199* 204* 115* 135* 157*     Urinalysis    Component Value Date/Time   COLORURINE STRAW (A) 12/12/2021 0228   APPEARANCEUR  CLEAR 12/12/2021 0228   LABSPEC 1.006 12/12/2021 0228   PHURINE 6.0 12/12/2021 0228   GLUCOSEU 50 (A) 12/12/2021 0228   HGBUR MODERATE (A) 12/12/2021 0228   BILIRUBINUR NEGATIVE 12/12/2021 0228   KETONESUR NEGATIVE 12/12/2021 0228   PROTEINUR NEGATIVE 12/12/2021 0228   UROBILINOGEN 1.0 03/15/2015 1117   NITRITE NEGATIVE 12/12/2021 0228   LEUKOCYTESUR MODERATE (A) 12/12/2021 0228      Time coordinating discharge: 35 minutes  SIGNED:  Vernell Leep, MD,  FACP, [facility], [facility], [facility] (Care Management Physician Certified). [facility]  To contact the attending provider between 7A-7P or the covering provider during after hours 7P-7A, please log into the web site [email or web address] and access using universal [place] password for that web site. If you do not have the password, please call the hospital operator.

## 2021-12-14 NOTE — Progress Notes (Addendum)
Advanced Heart Failure Rounding Note  PCP-Cardiologist: Dr. 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 Also felt to be in a/c CHF and ACOPDE.  Subjective:    Echo EF 20-25%, AK LV apex, no obvious LV clot with Definity. RV normal    Now on Eliquis. Hgb stable, 15. No bleeding.   Fever resolved w/o tx. WBC  12>>7K.. Bld CX - NGTD   Creatinine trending down 1.6>1.25. Off diuretics.   Denies flank pain. Denies SOB. Wants to go home.   Objective:   Weight Range: 44.6 kg Body mass index is 19.52 kg/m.   Vital Signs:   Temp:  [98.6 F (37 C)-99.1 F (37.3 C)] 99 F (37.2 C) (01/04 0616) Pulse Rate:  [73-78] 74 (01/04 0616) Resp:  [16-18] 17 (01/04 0616) BP: (93-109)/(58-67) 93/58 (01/04 0616) SpO2:  [91 %-98 %] 92 % (01/04 0616) Last BM Date: 12/13/21  Weight change: Filed Weights   12/11/21 0334 12/13/21 0633  Weight: 42.4 kg 44.6 kg    Intake/Output:   Intake/Output Summary (Last 24 hours) at 12/14/2021 0746 Last data filed at 12/13/2021 1937 Gross per 24 hour  Intake 360 ml  Output --  Net 360 ml      Physical Exam    General:  No resp difficulty HEENT: normal Neck: supple. JVP 7-8  Carotids 2+ bilat; no bruits. No lymphadenopathy or thryomegaly appreciated. Cor: PMI nondisplaced. Regular rate & rhythm. No rubs, gallops or murmurs. Lungs: clear. No wheeze. On room air.  Abdomen: soft, nontender, nondistended. No hepatosplenomegaly. No bruits or masses. Good bowel sounds. Extremities: no cyanosis, clubbing, rash, edema Neuro: alert & orientedx3, cranial nerves grossly intact. moves all 4 extremities w/o difficulty. Affect pleasant  Telemetry   SR 70-80s  personally reviewed   EKG    Admit EKG NSR 81 bpm, BAE, pulmonary disease pattern   Labs    CBC Recent Labs    12/13/21 0345 12/14/21 0241  WBC 7.2 7.4  HGB 15.1* 16.0*  HCT 47.3* 48.7*  MCV 93.5 92.4  PLT 159 623   Basic Metabolic Panel Recent Labs    12/12/21 0021 12/13/21 0345 12/14/21 0416  NA 135 133* 134*  K 3.4* 3.4* 4.5  CL 94* 93* 97*  CO2 26 27 24   GLUCOSE 106* 119* 118*  BUN 16 28* 29*  CREATININE 1.41* 1.57* 1.25*  CALCIUM 9.6 9.6 10.1  MG 1.9 2.4  --    Liver Function Tests Recent Labs    12/12/21 0021  AST 61*  ALT 59*  ALKPHOS 77  BILITOT 2.1*  PROT 7.1  ALBUMIN 3.9   No results for input(s): LIPASE, AMYLASE in the last 72 hours.  Cardiac Enzymes No results for input(s): CKTOTAL, CKMB, CKMBINDEX, TROPONINI in the last 72 hours.  BNP: BNP (last 3 results) Recent Labs    05/11/21 1512 09/28/21 2327 12/10/21 1653  BNP 836.0* 1,864.0* 2,294.6*    ProBNP (last 3 results) Recent Labs    07/19/21 0945  PROBNP 3,162*     D-Dimer No results for input(s): DDIMER in the last 72 hours. Hemoglobin A1C No results for input(s): HGBA1C in the last 72 hours. Fasting Lipid Panel No results for  input(s): CHOL, HDL, LDLCALC, TRIG, CHOLHDL, LDLDIRECT in the last 72 hours. Thyroid Function Tests No results for input(s): TSH, T4TOTAL, T3FREE, THYROIDAB in the last 72 hours.  Invalid input(s): FREET3  Other results:   Imaging    No results found.   Medications:     Scheduled Medications:  apixaban  5 mg Oral BID   atorvastatin  80 mg Oral q1800   dapagliflozin propanediol  10 mg Oral QAC breakfast   feeding supplement  237 mL Oral TID BM   insulin aspart  0-9 Units Subcutaneous TID WC   ivabradine  7.5 mg Oral BID WC   multivitamin with minerals  1 tablet Oral Daily   sodium chloride flush  3 mL Intravenous Q12H   spironolactone  12.5 mg Oral Daily    Infusions:    PRN Medications: diazepam,  guaiFENesin-dextromethorphan, ipratropium-albuterol, nitroGLYCERIN, polyethylene glycol, traMADol  Assessment/Plan   Acute on Chronic Systolic Heart Failure Due to ICM - Echo 09/04/18 ([facility]) LVEF 20%, Moderate AI, Mild MR, Mild TR, Severe LAE, RV mildly decreased.  - Echo 02/12/19: EF 25-30%, RV mildly reduced - Echo 11/21 EF 20-25% RV mild HK. - Echo 2022 EF < 20% RV normal - S/p MDT Single Chamber ICD  - Admitted w/ a/c CHF. BNP 2,294. CXR + small pleural effusions. Echo this admit, EF 20 to 25%. LV apex AK. RV normal  - Diuresed w/ IV Lasix and later stopped. Will need to restart torsemid 40 mg daily. - Continue Farxiga 10 mg daily  - Continue Spiro 12.5 mg daily  - Continue Corlanor 7.5 mg bid  - Failed Entresto due to hyperkalemia  - Not a candidate for advanced therapies with her degree of lung disease.  Also not a candidate for BATwire study for the same but we are trying to arrange commercial barostim.   - Saw Dr Trula Slade and was approved for [facility] but waiting on insurance approval.     2.  Rt Renal Infarct - noted on CT of A/P (does not appear acute) - ? Cardioembolic from PAF vs LV thrombus. Previously taken off a/c given hemoptysis. TTE w/ no visible thrombus  - now on Eliquis  - Hgb stable.   3. AECODP -No wheeze - per primary team   4. FUO - spiked temp Temp 101.5 on 1/2  - Resolved.  - WBC 12.8>>7K today  CXR - for PNA. PCT 0.15.  - Blood - NGTD - Urine CX multiple species--> recollect.  - no indication for abx currently   5. PAF - Maintaining SR  - ? If recent occurrence leading to renal infarct - now Eliquis 5 mg bid  - Given small size and mild CKD may be candidate for dose reduction after a few weeks. Will d/w pharmacy. WiIl discuss with Watchman team but given that clot may have originated from LV apex this may not provide adequate protection. - Will need Zio monitor at discharge- ordered. If d/c today will need Zio Placed.  - keep K > 4.0 and Mg > 2.0    6. CAD - Hx of NSTEMI/STEMI s/p Emergent CABG 02/03/18: Cath with severe 2v CAD as above with severe LV dysfunction/ICM. s/p Emergent CABG 02/03/18.   - Had NSTEMI and now s/p LHC 02/12/19 with DES to the ostial ramus extending into the distal left main. - no CP. HS trop 29>>30 - continue medical therapy  - on statin. No ASA given need for a/c and bleed history   7. Hypokalemia -Stable  today.   8. 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  - Chest CT 02/19/18 with mass-like consolidation in R hilum concerning for recurrent tumor.  - Repeat CT 03/27/2018 -No definite findings of locally recurrent tumor in the right lung. 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 Continued chest CT surveillance is advised in 3-6 months. - S/p thoracentesis 04/2018. Cytology negative for malignancy.  - CTA 11/19 stable scarring in the right hilum consistent with emphysema. Bronchoscopy on 11/15, no obvious source of bleeding  9. Recurrent pleural effusions  - s/p pleurodesis at [facility] - resolved, CXR this admit w/ only small rt effusion   10. Chronic Hypoxic Respiratory Failure - multifactorial 2/2 CHF and COPD. On home O2 PRN - treatment outlined above - followed by outpatient pulmonology    11. AKI on Stage IIIa CKD  - SCr baseline ~1.2-1.4  - Creatinine donw to 1.25 today  - Restart torsemide today.   Needs to ambulate.   HF F/U set up next week.   Length of Stay: 3  Amy Clegg, NP  12/14/2021, 7:46 AM  Advanced Heart Failure Team Pager [phone] (M-F; 7a - 5p)  Please contact [facility] for night-coverage after hours (5p -7a ) and weekends on [email or web address]  Patient seen and examined with the above-signed Advanced Practice Provider and/or Housestaff. I personally reviewed laboratory data, imaging studies and relevant notes. I independently examined the patient and formulated the important aspects of the plan. I  have edited the note to reflect any of my changes or salient points. I have personally discussed the plan with the patient and/or family.  Felt poorly yesterday with weakness and nausea. Says she feels much better today. Wants to go home. Has not ambulated aside from going to bathroom. Reports flank pain and nausea improved. Denies CP or SOB. No bleeding on eliquis  General:  Lying in bed  No resp difficulty HEENT: normal Neck: supple. no JVD. Carotids 2+ bilat; no bruits. No lymphadenopathy or thryomegaly appreciated. Cor: PMI nondisplaced. Regular rate & rhythm. No rubs, gallops or murmurs. Lungs: clear but decreased throughout Abdomen: soft, nontender, nondistended. No hepatosplenomegaly. No bruits or masses. Good bowel sounds. Extremities: no cyanosis, clubbing, rash, edema Neuro: alert & orientedx3, cranial nerves grossly intact. moves all 4 extremities w/o difficulty. Affect pleasant  Overall improved but still weak. [place] for d/c from cardiac perspective. Can d/c home on prior cardiac meds with switch from ASA to Eliquis 5 bid.   Will try to arrange placement of Zio monitor prior to d/c for AFib surveillance.   I will follow in clinic.   Glori Bickers, MD  9:01 AM

## 2021-12-14 NOTE — Progress Notes (Unsigned)
Enrolled for [employer] to mail a ZIO AT Live Telemetry monitor to patients address on file.  Patient discharged later in the day.  Her monitor was not applied at discharge.

## 2021-12-14 NOTE — Progress Notes (Signed)
Pt requested to wait till later this am for weight. Jessie Foot, RN

## 2021-12-14 NOTE — Discharge Instructions (Signed)

## 2021-12-14 NOTE — Progress Notes (Signed)
Mobility Specialist Progress Note    12/14/21 1449  Mobility  Activity Ambulated in hall  Level of Assistance Standby assist, set-up cues, supervision of patient - no hands on  Assistive Device None  Distance Ambulated (ft) 350 ft  Mobility Ambulated independently in hallway  Mobility Response Tolerated well  Mobility performed by Mobility specialist  $Mobility charge 1 Mobility   Pt received in room and agreeable. No complaints on walk. Returned to room to finish getting ready to discharge.   [facility]: Lambert Rhonda W Mobility Specialist  M.S. Primary Phone: [phone] M.S. Secondary Phone: [phone]

## 2021-12-17 LAB — CULTURE, BLOOD (ROUTINE X 2)
Culture: NO GROWTH
Culture: NO GROWTH
Special Requests: ADEQUATE
Special Requests: ADEQUATE

## 2021-12-19 ENCOUNTER — Ambulatory Visit (INDEPENDENT_AMBULATORY_CARE_PROVIDER_SITE_OTHER): Payer: BC Managed Care – PPO

## 2021-12-19 DIAGNOSIS — Z95810 Presence of automatic (implantable) cardiac defibrillator: Secondary | ICD-10-CM

## 2021-12-19 DIAGNOSIS — I5042 Chronic combined systolic (congestive) and diastolic (congestive) heart failure: Secondary | ICD-10-CM | POA: Diagnosis not present

## 2021-12-19 NOTE — Progress Notes (Signed)
EPIC Encounter for ICM Monitoring  Patient Name: Brittany [name] is a 61 y.o. female Date: 12/19/2021 Primary Care Physican: Maude Leriche, [place] Primary Cardiologist: [place] Electrophysiologist: [place] 12/19/2021 Weight: 101 lbs      Spoke with patient and heart failure questions reviewed.  Pt dx with blood clot on her kidney during hospitalization 12/10/2021-12/14/2021 for diuresis.  She is feeling better today since hospital discharge.    Optivol Thoracic impedance suggesting possible fluid accumulation returned 11/18/2021 until 12/11/2021.  Suggesting dryness starting 1/2 which correlates with being diuresed during hospitalization but returned to baseline 1/9.     Prescribed: Torsemide 20 mg Take 2 tablets (40 mg total) daily.  Per pt, Dr [name] has approved her taking extra 20 mg Torsemide when needed.  Potassium 20 mEq take 2 tablets (40 mEq total) by mouth daily.  Take 2 tablets (40 mEq total) with Metolazone Metolazone 2.5 mg take by mouth as needed for fluid  Spironolactone 25 mg take 0.5 tablet by mouth daily Farxiga 10 mg take 1 tablet daily   Labs: 12/14/2021 Creatinine 1.25, BUN 29, Potassium 4.5, Sodium 134, GFR 49 12/13/2021 Creatinine 1.57, BUN 28, Potassium 3.4, Sodium 133, GFR 37  12/12/2021 Creatinine 1.41, BUN 16, Potassium 3.4, Sodium 135, GFR 42  12/11/2021 Creatinine 1.18, BUN 12, Potassium 3.6, Sodium 138, GFR 52  12/10/2021 Creatinine 1.19, BUN 15, Potassium 3.4, Sodium 138, GFR 52  10/10/2021 Creatinine 1.22, BUN 47, Potassium 3.2, Sodium 134, GFR 50 A complete set of results can be found in Results Review.   Recommendations:  Advised to drink 64 oz to stay hydrated.  Encouraged her call if she experiences any fluid symptoms.     Follow-up plan: ICM clinic phone appointment on 01/02/2022 to recheck fluid levels.   91 day device clinic remote transmission 01/03/2022.     EP/Cardiology Office Visits:  12/22/2021 with Dr [name].   Copy of ICM check  sent to Dr. Rayann Heman.  3 month ICM trend: 12/19/2021.    12-14 Month ICM trend:     [name], RN 12/19/2021 10:43 AM

## 2021-12-22 ENCOUNTER — Encounter (HOSPITAL_COMMUNITY): Payer: Self-pay | Admitting: Internal Medicine

## 2021-12-22 ENCOUNTER — Ambulatory Visit (HOSPITAL_COMMUNITY)
Admission: RE | Admit: 2021-12-22 | Discharge: 2021-12-22 | Disposition: A | Discharge status: Home / Self Care | Payer: BC Managed Care – PPO | Source: Ambulatory Visit | Attending: Internal Medicine | Admitting: Internal Medicine

## 2021-12-22 ENCOUNTER — Other Ambulatory Visit (HOSPITAL_COMMUNITY): Payer: Self-pay | Admitting: Internal Medicine

## 2021-12-22 ENCOUNTER — Other Ambulatory Visit: Payer: Self-pay

## 2021-12-22 VITALS — BP 118/60 | HR 73 | Wt 102.0 lb

## 2021-12-22 DIAGNOSIS — E875 Hyperkalemia: Secondary | ICD-10-CM | POA: Diagnosis not present

## 2021-12-22 DIAGNOSIS — J441 Chronic obstructive pulmonary disease with (acute) exacerbation: Secondary | ICD-10-CM | POA: Diagnosis not present

## 2021-12-22 DIAGNOSIS — Z923 Personal history of irradiation: Secondary | ICD-10-CM | POA: Insufficient documentation

## 2021-12-22 DIAGNOSIS — Z955 Presence of coronary angioplasty implant and graft: Secondary | ICD-10-CM | POA: Insufficient documentation

## 2021-12-22 DIAGNOSIS — I252 Old myocardial infarction: Secondary | ICD-10-CM | POA: Diagnosis not present

## 2021-12-22 DIAGNOSIS — I5022 Chronic systolic (congestive) heart failure: Secondary | ICD-10-CM

## 2021-12-22 DIAGNOSIS — Z7901 Long term (current) use of anticoagulants: Secondary | ICD-10-CM | POA: Diagnosis not present

## 2021-12-22 DIAGNOSIS — J9621 Acute and chronic respiratory failure with hypoxia: Secondary | ICD-10-CM | POA: Insufficient documentation

## 2021-12-22 DIAGNOSIS — J984 Other disorders of lung: Secondary | ICD-10-CM | POA: Diagnosis not present

## 2021-12-22 DIAGNOSIS — N280 Ischemia and infarction of kidney: Secondary | ICD-10-CM

## 2021-12-22 DIAGNOSIS — J90 Pleural effusion, not elsewhere classified: Secondary | ICD-10-CM | POA: Diagnosis not present

## 2021-12-22 DIAGNOSIS — I480 Paroxysmal atrial fibrillation: Secondary | ICD-10-CM | POA: Insufficient documentation

## 2021-12-22 DIAGNOSIS — Z951 Presence of aortocoronary bypass graft: Secondary | ICD-10-CM | POA: Diagnosis not present

## 2021-12-22 DIAGNOSIS — Z95810 Presence of automatic (implantable) cardiac defibrillator: Secondary | ICD-10-CM | POA: Diagnosis not present

## 2021-12-22 DIAGNOSIS — Z85118 Personal history of other malignant neoplasm of bronchus and lung: Secondary | ICD-10-CM | POA: Insufficient documentation

## 2021-12-22 DIAGNOSIS — R002 Palpitations: Secondary | ICD-10-CM

## 2021-12-22 DIAGNOSIS — J440 Chronic obstructive pulmonary disease with acute lower respiratory infection: Secondary | ICD-10-CM | POA: Diagnosis not present

## 2021-12-22 DIAGNOSIS — R42 Dizziness and giddiness: Secondary | ICD-10-CM | POA: Diagnosis not present

## 2021-12-22 DIAGNOSIS — I2510 Atherosclerotic heart disease of native coronary artery without angina pectoris: Secondary | ICD-10-CM | POA: Diagnosis not present

## 2021-12-22 DIAGNOSIS — Z9221 Personal history of antineoplastic chemotherapy: Secondary | ICD-10-CM | POA: Insufficient documentation

## 2021-12-22 DIAGNOSIS — Z7982 Long term (current) use of aspirin: Secondary | ICD-10-CM | POA: Insufficient documentation

## 2021-12-22 DIAGNOSIS — Z6710 Type A blood, Rh positive: Secondary | ICD-10-CM | POA: Diagnosis not present

## 2021-12-22 DIAGNOSIS — Z7984 Long term (current) use of oral hypoglycemic drugs: Secondary | ICD-10-CM | POA: Diagnosis not present

## 2021-12-22 DIAGNOSIS — I110 Hypertensive heart disease with heart failure: Secondary | ICD-10-CM | POA: Insufficient documentation

## 2021-12-22 DIAGNOSIS — R042 Hemoptysis: Secondary | ICD-10-CM | POA: Diagnosis not present

## 2021-12-22 DIAGNOSIS — F17210 Nicotine dependence, cigarettes, uncomplicated: Secondary | ICD-10-CM | POA: Insufficient documentation

## 2021-12-22 DIAGNOSIS — Z79899 Other long term (current) drug therapy: Secondary | ICD-10-CM | POA: Insufficient documentation

## 2021-12-22 MED ORDER — DOXYCYCLINE HYCLATE 100 MG PO TABS: 100.0000 mg | ORAL_TABLET | Freq: Two times a day (BID) | ORAL | 0 refills | Status: AC

## 2021-12-22 MED ORDER — PREDNISONE 10 MG PO TABS: ORAL_TABLET | ORAL | 0 refills | Status: AC

## 2021-12-22 NOTE — Patient Instructions (Signed)
No Labs done today.   START Doxycycline 100mg  (1 table) by mouth 2 times daily for 7 days.  START Prednisone 40mg  (4 tablets) by mouth for 2 days THEN DECREASE to 30mg  (3 tablets) by mouth daily for 2 days THEN DECREASE to 20mg  (2 tablets) by mouth daily for 2 days THEN DECREASE to 10mg  (1 tablet) by mouth daily for 2 days.   No other medication changes were made. Please continue all current medications as prescribed.  Your physician recommends that you schedule a follow-up appointment in: 3 months   If you have any questions or concerns before your next appointment please send [place] a message through [place] or call our office at [phone].    TO LEAVE A MESSAGE FOR THE NURSE SELECT OPTION 2, PLEASE LEAVE A MESSAGE INCLUDING: YOUR NAME DATE OF BIRTH CALL BACK NUMBER REASON FOR CALL**this is important as we prioritize the call backs  YOU WILL RECEIVE A CALL BACK THE SAME DAY AS LONG AS YOU CALL BEFORE 4:00 PM   Do the following things EVERYDAY: Weigh yourself in the morning before breakfast. Write it down and keep it in a log. Take your medicines as prescribed Eat low salt foods--Limit salt (sodium) to 2000 mg per day.  Stay as active as you can everyday Limit all fluids for the day to less than 2 liters   At the [facility], you and your health needs are our priority. As part of our continuing mission to provide you with exceptional heart care, we have created designated Provider Care Teams. These Care Teams include your primary Cardiologist (physician) and Advanced Practice Providers (APPs- Physician Assistants and Nurse Practitioners) who all work together to provide you with the care you need, when you need it.   You may see any of the following providers on your designated Care Team at your next follow up: Dr Glori Bickers Dr Haynes Kerns, NP Lyda Jester, [place] Audry Riles, PharmD   Please be sure to bring in all your medications bottles  to every appointment.

## 2021-12-22 NOTE — Progress Notes (Signed)
ReDS Vest / Clip - 12/22/21 1400       ReDS Vest / Clip   Station Marker A    Ruler Value 25    ReDS Value Range High volume overload    ReDS Actual Value 44

## 2021-12-22 NOTE — Progress Notes (Signed)
Advanced HF Clinic Note   PCP:  Scifres, Dorothy, PA-C  Cardiologist:  None Primary HF: Dr [name] Oncology: Dr [name]   Chief Complaint: Heart Failure  History of Present Illness: Brittany [name] is a 61 y.o. 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 Underwent emergent cath 02/03/18 showed LAD 100% stenosed, LCx 95% stenosed. Taken for emergent CABG 02/03/18. Required impella post op. Hospital course complicated by cardiogenic shock, HCAP, A fib, respiratory failure, and swallowing issues. She was discharged to SNF. Discharge weight 103 pounds.    In 2019 had multiple hospitalizations for HF and pleural effusion. Underwent pleurodesis at [facility].   Admitted 3/20 with NSTEMI and HF. Received DES to ostial ramus into distal left main based on cath below and underwent diuresis. Meds adjusted as tolerated. Echo with EF 25-30%.   Admitted 11/21 with COPD flare with mild HF and hemoptysis. CTA showed stable scarring in the right hilum consistent with emphysema.. Underwent bronchoscopy on 11/15, no obvious source of bleeding found on examination.   Echo 11/21 EF 20-25%. RV mildly HK.   Admitted 02/09/21 with chest pain and shortness of breath. HS-Trop with no trend. Echo with EF < 20%. Treated for AECOPD exacerbation. Breathing improved. Discharged on torsemide 60 mg daily.  Admitted 4/22 with ADHF. Diuresed with IV lasix and transitioned back to torsemide 80 mg daily x 4 days then down to torsemide 60 mg daily.   Admitted 12/22 with R renal infarct felt to be cardio-embolic (no AF found). Started on Eliquis  Here for post-hospital f/u. Says she doesn't feel well. Very tired. No appetite. Doesn't want to eat. No CP. Having productive cough at times with  yellowish brown sputum. No edema, orthopnea or PND. Weight going down. No hemoptysis or other bleeding. Taking torsemide 40 daily. Feels lightheaded. Not smoking much at all. Has not been checked for COVID or flu.   ReDS 44%   ICD interrogation: No VT/AF Fluid down Activity level 1.5 hr/day    Cardidac Studies: ECHO 2022: EF < 20% RV normal  ECHO 2021: EF 20-25% RV mildly reduced.  ECHO 02/12/19: EF 25-30%, RV mildly reduced ECHO 02/10/21: EF <20%, G2DD  05/18/2021 PFT's: moderately severe airway obstruction and a diffusion defect suggesting emphysema.   LHC 02/12/19: Ost LAD to Prox LAD lesion is 100% stenosed. Ost Cx to Prox Cx lesion is 100% stenosed. Ost Ramus lesion is 99% stenosed. Post intervention, there is a 0% residual stenosis. A drug-eluting stent was successfully placed using a Baldwin 2.25X15. SVG and is normal in caliber. The graft exhibits no disease. Ost 1st Diag lesion is 70% stenosed. SVG and is normal in caliber. Prox Graft lesion is 100% stenosed. 1.  Significant underlying two-vessel coronary artery disease with patent SVG to OM and SVG to diagonal which supplies the LAD territory.  Atretic LIMA to LAD given competitive flow from SVG to diagonal.  Severe ostial ramus artery stenosis not supplied by bypass with his area suggestive of plaque rupture. 2.  Severely elevated left ventricular end-diastolic pressure 34 mmHg.  Left ventricular angiography was not performed. 3.  Successful angioplasty and drug-eluting stent placement to the ostial ramus extending into the distal left main.  [place] 2019  RA = 8 RV = 49/9  PA = 47/20 (32) PCW = 19 Fick cardiac output/index = 4.3/3.1 PVR = 3.0 wu AO sat = 93% PA sat = 62%, 64%  Assessment: 1. Minimally elevated left heart pressures 2. Mild PAH 3. Normal cardiac output     Past Medical History:  Diagnosis Date   Acute respiratory failure ([place]) 0/17/5102   Acute systolic congestive heart failure ([place])  02/03/2018   Allergy    Anxiety    Asthma    DM2 (diabetes mellitus, type 2) ([place]) 10/20/2020   Hypertension    PAF (paroxysmal atrial fibrillation) (HCC)    Prophylactic measure 08/03/14-08/19/14   Prophyl. 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   LIMA to LAD, SVG to D1, SVG to OM1, EVH via right thigh with implantation of Impella LD LVAD via direct aortic approach   Small cell lung cancer ([place]) 03/16/2014   Past Surgical History:  Procedure Laterality Date   BRONCHIAL BRUSHINGS  10/25/2020   Procedure: BRONCHIAL BRUSHINGS;  Surgeon: Collene Gobble, MD;  Location: [facility] ENDOSCOPY;  Service: Pulmonary;;   BRONCHIAL NEEDLE ASPIRATION BIOPSY  10/25/2020   Procedure: BRONCHIAL NEEDLE ASPIRATION BIOPSIES;  Surgeon: Collene Gobble, MD;  Location: [place] ENDOSCOPY;  Service: Pulmonary;;   CARDIAC DEFIBRILLATOR PLACEMENT  08/15/2018   MDT Visia AF MRI VR ICD implanted by Dr Loel Lofty for primary prevention of sudden   CESAREAN SECTION     CORONARY ARTERY BYPASS GRAFT N/A 02/03/2018   Procedure: CORONARY ARTERY BYPASS GRAFTING (CABG);  Surgeon: Rexene Alberts, MD;  Location: [place];  Service: Open Heart Surgery;  Laterality: N/A;  Time 3 using left internal mammary artery and endoscopically harvested right saphenous vein   CORONARY BALLOON ANGIOPLASTY N/A 02/03/2018   Procedure: CORONARY BALLOON ANGIOPLASTY;  Surgeon: Martinique, Peter M, MD;  Location: [place] CV LAB;  Service: Cardiovascular;  Laterality: N/A;   CORONARY STENT INTERVENTION N/A 02/12/2019   Procedure: CORONARY STENT INTERVENTION;  Surgeon: Wellington Hampshire, MD;  Location: [place] CV LAB;  Service: Cardiovascular;  Laterality: N/A;   CORONARY/GRAFT ACUTE MI REVASCULARIZATION N/A 02/03/2018   Procedure: Coronary/Graft Acute MI Revascularization;  Surgeon: Martinique, Peter M, MD;  Location: [place] CV LAB;  Service: Cardiovascular;  Laterality: N/A;   ENDOBRONCHIAL ULTRASOUND N/A 10/25/2020   Procedure: ENDOBRONCHIAL  ULTRASOUND;  Surgeon: Collene Gobble, MD;  Location: [facility] ENDOSCOPY;  Service: Pulmonary;  Laterality: N/A;   FLEXIBLE BRONCHOSCOPY  10/25/2020   Procedure: FLEXIBLE BRONCHOSCOPY;  Surgeon: Collene Gobble, MD;  Location: [facility] ENDOSCOPY;  Service: Pulmonary;;   IABP INSERTION N/A 02/03/2018   Procedure: IABP Insertion;  Surgeon: Martinique, Peter M, MD;  Location: [place] CV LAB;  Service: Cardiovascular;  Laterality: N/A;   INTRAOPERATIVE TRANSESOPHAGEAL ECHOCARDIOGRAM N/A 02/03/2018   Procedure: INTRAOPERATIVE TRANSESOPHAGEAL ECHOCARDIOGRAM;  Surgeon: Rexene Alberts, MD;  Location: [place];  Service: Open Heart Surgery;  Laterality: N/A;   LEFT HEART CATH AND CORONARY ANGIOGRAPHY N/A 02/03/2018   Procedure: LEFT HEART CATH AND CORONARY ANGIOGRAPHY;  Surgeon: Martinique, Peter M, MD;  Location: [place] CV LAB;  Service: Cardiovascular;  Laterality: N/A;   LEFT HEART CATH AND CORS/GRAFTS ANGIOGRAPHY N/A 02/12/2019   Procedure: LEFT HEART CATH AND CORS/GRAFTS ANGIOGRAPHY;  Surgeon: Wellington Hampshire, MD;  Location: [place] CV LAB;  Service: Cardiovascular;  Laterality: N/A;   MEDIASTINOSCOPY N/A 03/11/2014   Procedure: MEDIASTINOSCOPY;  Surgeon: Melrose Nakayama, MD;  Location: [place];  Service: Thoracic;  Laterality: N/A;  PLACEMENT OF IMPELLA LEFT VENTRICULAR ASSIST DEVICE  02/03/2018   Procedure: PLACEMENT OF [place] LEFT VENTRICULAR ASSIST DEVICE LD;  Surgeon: Rexene Alberts, MD;  Location: [place];  Service: Open Heart Surgery;;   REMOVAL OF [place] LEFT VENTRICULAR ASSIST DEVICE N/A 02/08/2018   Procedure: REMOVAL OF [place] LEFT VENTRICULAR ASSIST DEVICE;  Surgeon: Rexene Alberts, MD;  Location: [place];  Service: Open Heart Surgery;  Laterality: N/A;   RIGHT HEART CATH N/A 02/03/2018   Procedure: RIGHT HEART CATH;  Surgeon: Martinique, Peter M, MD;  Location: [place] CV LAB;  Service: Cardiovascular;  Laterality: N/A;   RIGHT HEART CATH N/A 05/09/2018   Procedure: RIGHT HEART CATH;  Surgeon:  Jolaine Artist, MD;  Location: [place] CV LAB;  Service: Cardiovascular;  Laterality: N/A;   TEE WITHOUT CARDIOVERSION N/A 02/08/2018   Procedure: TRANSESOPHAGEAL ECHOCARDIOGRAM (TEE);  Surgeon: Rexene Alberts, MD;  Location: [place];  Service: Open Heart Surgery;  Laterality: N/A;   TUBAL LIGATION     VIDEO BRONCHOSCOPY WITH ENDOBRONCHIAL ULTRASOUND N/A 03/11/2014   Procedure: VIDEO BRONCHOSCOPY WITH ENDOBRONCHIAL ULTRASOUND;  Surgeon: Melrose Nakayama, MD;  Location: [facility] OR;  Service: Thoracic;  Laterality: N/A;     Current Outpatient Medications  Medication Sig Dispense Refill   albuterol (VENTOLIN HFA) 108 (90 Base) MCG/ACT inhaler Inhale 2 puffs into the lungs every 6 (six) hours as needed for wheezing or shortness of breath. 1 Inhaler 0   apixaban (ELIQUIS) 5 MG TABS tablet Take 1 tablet (5 mg total) by mouth 2 (two) times daily. 60 tablet 1   atorvastatin (LIPITOR) 80 MG tablet Take 1 tablet (80 mg total) by mouth daily at 6 PM. 30 tablet 1   blood glucose meter kit and supplies KIT Dispense based on patient and insurance preference. Use up to four times daily as directed. 1 each 0   CORLANOR 7.5 MG TABS tablet TAKE 1 TABLET (7.5 MG TOTAL) BY MOUTH 2 (TWO) TIMES DAILY WITH A MEAL. 180 tablet 1   FARXIGA 10 MG TABS tablet TAKE 1 TABLET (10 MG TOTAL) BY MOUTH DAILY BEFORE BREAKFAST. 30 tablet 6   Glucerna (GLUCERNA) LIQD Take 237 mLs by mouth daily in the afternoon.     ipratropium-albuterol (DUONEB) 0.5-2.5 (3) MG/3ML SOLN Take 3 mLs by nebulization 2 (two) times daily as needed (for shortness of breath).     metolazone (ZAROXOLYN) 2.5 MG tablet Take 2.5 mg by mouth daily as needed (edema).     Multiple Vitamin (MULTIVITAMIN WITH MINERALS) TABS tablet Take 1 tablet by mouth daily.     nitroGLYCERIN (NITROSTAT) 0.4 MG SL tablet Place 1 tablet (0.4 mg total) under the tongue every 5 (five) minutes as needed for chest pain. 90 tablet 5   potassium chloride SA (KLOR-CON M) 20 MEQ  tablet Take 20 mEq by mouth as needed.     spironolactone (ALDACTONE) 25 MG tablet Take 0.5 tablets (12.5 mg total) by mouth daily. 45 tablet 3   torsemide (DEMADEX) 20 MG tablet Take 2 tablets (40 mg total) by mouth daily. 60 tablet 1   No current facility-administered medications for this encounter.    Allergies:   Codeine   Social History:  The patient  reports that she quit smoking about 7 years ago. Her smoking use included cigarettes. She has a 20.00 pack-year smoking history. She has never used smokeless tobacco. She reports current alcohol use of about 8.0 standard drinks per week. She reports current drug use. Drug:  Marijuana.   Family History:  The patient's family history includes Cancer in her maternal grandmother; Diabetes in her paternal grandmother; Heart attack in her mother; Heart disease in her mother; Hypertension in her maternal grandmother and mother.   ROS:  Please see the history of present illness.   All other systems are personally reviewed and negative.    Vitals:   12/22/21 1355  BP: 118/60  Pulse: 73  SpO2: 98%  Weight: 46.3 kg (102 lb)   Wt Readings from Last 3 Encounters:  12/22/21 46.3 kg (102 lb)  12/13/21 44.6 kg (98 lb 4.8 oz)  10/10/21 47.2 kg (104 lb)     PHYSICAL EXAM: General:  Thin. Weak audible wheezing HEENT: normal Neck: supple. no JVD. Carotids 2+ bilat; no bruits. No lymphadenopathy or thryomegaly appreciated. Cor: PMI nondisplaced. Regular rate & rhythm. No rubs, gallops or murmurs. Lungs: +diffuse rhonchi  Abdomen: soft, nontender, nondistended. No hepatosplenomegaly. No bruits or masses. Good bowel sounds. Extremities: no cyanosis, clubbing, rash, edema Neuro: alert & orientedx3, cranial nerves grossly intact. moves all 4 extremities w/o difficulty. Affect pleasant   Recent Labs: 02/09/2021: TSH 0.765 07/19/2021: NT-Pro BNP 3,162 12/10/2021: B Natriuretic Peptide 2,294.6 12/12/2021: ALT 59 12/13/2021: Magnesium 2.4 12/14/2021: BUN 29;  Creatinine, Ser 1.25; Hemoglobin 16.0; Platelets 178; Potassium 4.5; Sodium 134  Personally reviewed   Wt Readings from Last 3 Encounters:  12/13/21 44.6 kg (98 lb 4.8 oz)  10/10/21 47.2 kg (104 lb)  10/01/21 47.4 kg (104 lb 8 oz)    ASSESSMENT AND PLAN:  1. CAD  - Hx of NSTEMI/STEMI s/p Emergent CABG 02/03/18: Cath with severe 2v CAD as above with severe LV dysfunction/ICM. s/p Emergent CABG 02/03/18.   - Had NSTEMI and now s/p LHC 02/12/19 with DES to the ostial ramus extending into the distal left main.  - No longer on brillinta or ASA due to bleeding and need for [facility] -Continue hi-intensity statin.  2.  Acute exacerbation of COPD - Will start doxy and prednisone taper - On Duonebs  3.Chronic Systolic HF due to ICM. S/p Medtronic single chamber ICD. - Echo 09/04/18 ([facility]) LVEF 20%, Moderate AI, Mild MR, Mild TR, Severe LAE, RV mildly decreased.  - Echo 02/12/19: EF 25-30%, RV mildly reduced - Echo 11/21 EF 20-25% RV mild HK. - Echo 2022 EF < 20% RV normal - Blood Type A +  - Worse today NYHA IIIb-IV.  Volume status looks ok on exam and Optivol but up on ReDS. Suspect she hs COPD flare.  - Continue torsemide 40 mg daily + 20  mEq of KCl daily. - Tried entresto 2019 but stopped due to hyperkalemia .  - Continue 12.5 mg spiro daily - Continue corlanor 7.5 mg bid. - Continue Farxiga 10 mg daily. - No b-blocker yet due to intolerance with low output - Not a candidate for advanced therapies with her degree of lung disease.  Also not a candidate for [employer] or [employer].  4. Recurrent pleural effusions s/p pleurodesis Resolved  5. Acute on chronic hypoxic respiratory failure - Multifactorial - Wears O2 PRN at home.  - Treating today for COPD flare - Stressed need to stop smoking  - She is followed by pulmonology.    5. PAF - CHA2DS2/VASc is at least 4. (CHF, Vasc disease, HTN, Female).   - She has history of Afib RVR in the past in the setting of her Lung CA.  - Previously on  Xarelto but stopped due to hemoptysis.  - Started  on Eliquis in 12/22 due to renal infarct. - Place ZIO to surveil for AF - No bleeding. Follow closely - On ASA.  6. R renal infarct - 12/22 - Thought to be cardioembolic - Continue Eliquis - Place ZIO to surveil for AF  7. 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  - Chest CT 02/19/18 with mass-like consolidation in R hilum concerning for recurrent tumor.  - Repeat CT 03/27/2018 -No definite findings of locally recurrent tumor in the right lung. 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 Continued chest CT surveillance is advised in 3-6 months. - S/p thoracentesis 04/2018. Cytology negative for malignancy.  - CTA 11/19 stable scarring in the right hilum consistent with emphysema. Bronchoscopy on 11/15, no obvious source of bleeding  Signed, Glori Bickers, MD 12/22/2021 1:55 PM

## 2021-12-23 ENCOUNTER — Other Ambulatory Visit (HOSPITAL_COMMUNITY): Payer: Self-pay

## 2021-12-23 ENCOUNTER — Telehealth (HOSPITAL_COMMUNITY): Payer: Self-pay

## 2021-12-23 NOTE — Telephone Encounter (Signed)
Pharmacy Transitions of Care Follow-up Telephone Call  Date of discharge: 12/14/2021   Discharge Diagnosis: Afib  Medication changes made at discharge: START taking: Eliquis (apixaban)  multivitamin with minerals  CHANGE how you take: torsemide (DEMADEX)    STOP taking: ALPRAZolam 0.25 MG tablet (XANAX)  aspirin EC 81 MG tablet  predniSONE 10 MG tablet (DELTASONE)   Medication changes verified by the patient? N    Medication Accessibility:  Home Pharmacy: [facility] [phone] [place]   Was the patient provided with refills on discharged medications? Y   Have all prescriptions been transferred from [facility] to home pharmacy? No, Patient thinks MD sent new rx's to home pharmacy.       Medication Review:   Patient declined medication review, patient was educated in hospital before discharge in detail.  Follow-up Appointments:  [facility] f/u appt confirmed? Y Scheduled to see Dr.Bensimhon on 12/22/2021  If their condition worsens, is the pt aware to call PCP or go to the Emergency Dept.? Yes  Final Patient Assessment: -Declined patient education -Pt had post discharge appointment  Darcus Austin, PharmD

## 2021-12-27 ENCOUNTER — Telehealth (HOSPITAL_COMMUNITY): Payer: Self-pay | Admitting: Surgery

## 2021-12-27 NOTE — Telephone Encounter (Signed)
I called patient to let her know that I received a message from the [place] to indicate that her device applied in AHF clinic was not working appropriately.  She was already informed and tell me that she expects the replacement device to arrive via the mail this evening.

## 2021-12-28 DIAGNOSIS — I480 Paroxysmal atrial fibrillation: Secondary | ICD-10-CM

## 2022-01-02 ENCOUNTER — Ambulatory Visit: Payer: BC Managed Care – PPO

## 2022-01-02 DIAGNOSIS — Z95810 Presence of automatic (implantable) cardiac defibrillator: Secondary | ICD-10-CM

## 2022-01-02 DIAGNOSIS — I5022 Chronic systolic (congestive) heart failure: Secondary | ICD-10-CM

## 2022-01-03 NOTE — Progress Notes (Signed)
EPIC Encounter for ICM Monitoring  Patient Name: Brittany [name] is a 61 y.o. female Date: 01/03/2022 Primary Care Physican: Maude Leriche, [place] Primary Cardiologist: [place] Electrophysiologist: [place] 12/19/2021 Weight: 101 lbs      Spoke with patient and heart failure questions reviewed.  Pt reports she is feeling better.   Optivol Thoracic impedance suggesting fluid levels returned to normal.     Prescribed: Torsemide 20 mg Take 2 tablets (40 mg total) daily.  Per pt, Dr [name] has approved her taking extra 20 mg Torsemide when needed.  Potassium 20 mEq take 1 tablets (20 mEq total) by mouth as needed Metolazone 2.5 mg take by mouth as needed for fluid  Spironolactone 25 mg take 0.5 tablet by mouth daily Farxiga 10 mg take 1 tablet daily   Labs: 12/14/2021 Creatinine 1.25, BUN 29, Potassium 4.5, Sodium 134, GFR 49 12/13/2021 Creatinine 1.57, BUN 28, Potassium 3.4, Sodium 133, GFR 37  12/12/2021 Creatinine 1.41, BUN 16, Potassium 3.4, Sodium 135, GFR 42  12/11/2021 Creatinine 1.18, BUN 12, Potassium 3.6, Sodium 138, GFR 52  12/10/2021 Creatinine 1.19, BUN 15, Potassium 3.4, Sodium 138, GFR 52  10/10/2021 Creatinine 1.22, BUN 47, Potassium 3.2, Sodium 134, GFR 50 A complete set of results can be found in Results Review.   Recommendations:   Encouraged her call if she experiences any fluid symptoms.     Follow-up plan: ICM clinic phone appointment on 01/30/2022.   91 day device clinic remote transmission 04/02/2022.     EP/Cardiology Office Visits:  03/24/2022 with Dr [name].   Copy of ICM check sent to Dr. Rayann Heman.  3 month ICM trend: 01/02/2022.    12-14 Month ICM trend:     [name], RN 01/03/2022 5:04 PM

## 2022-01-30 ENCOUNTER — Ambulatory Visit (INDEPENDENT_AMBULATORY_CARE_PROVIDER_SITE_OTHER): Payer: BC Managed Care – PPO

## 2022-01-30 DIAGNOSIS — I5022 Chronic systolic (congestive) heart failure: Secondary | ICD-10-CM | POA: Diagnosis not present

## 2022-01-30 DIAGNOSIS — Z95810 Presence of automatic (implantable) cardiac defibrillator: Secondary | ICD-10-CM | POA: Diagnosis not present

## 2022-01-30 NOTE — Progress Notes (Signed)
EPIC Encounter for ICM Monitoring  Patient Name: Brittany [name] is a 61 y.o. female Date: 01/30/2022 Primary Care Physican: Maude Leriche, [place] Primary Cardiologist: [place] Electrophysiologist: Allred 12/19/2021 Weight: 101 lbs  01/27/2022 Weight: 97 lbs  01/30/2022 Weight: 99 lbs (increased 2 lbs)    Spoke with patient and heart failure questions reviewed.  Pt reports having to sit up at night to breathe for the next couple of nights and unable to sleep or get comfortable.   Optivol Thoracic impedance suggesting possible fluid accumulation starting 1/28.      Prescribed: Torsemide 20 mg Take 2 tablets (40 mg total) daily.  Per pt, Dr [name] has approved her taking extra 20 mg Torsemide when needed.  Potassium 20 mEq take 1 tablets (20 mEq total) by mouth as needed Metolazone 2.5 mg take by mouth as needed for fluid  Spironolactone 25 mg take 0.5 tablet by mouth daily Farxiga 10 mg take 1 tablet daily   Labs: 12/14/2021 Creatinine 1.25, BUN 29, Potassium 4.5, Sodium 134, GFR 49 12/13/2021 Creatinine 1.57, BUN 28, Potassium 3.4, Sodium 133, GFR 37  12/12/2021 Creatinine 1.41, BUN 16, Potassium 3.4, Sodium 135, GFR 42  12/11/2021 Creatinine 1.18, BUN 12, Potassium 3.6, Sodium 138, GFR 52  12/10/2021 Creatinine 1.19, BUN 15, Potassium 3.4, Sodium 138, GFR 52  10/10/2021 Creatinine 1.22, BUN 47, Potassium 3.2, Sodium 134, GFR 50 A complete set of results can be found in Results Review.   Recommendations:   Advised to take extra Torsemide 20 mg 1 tablet x 2 days with 1 potassium since Dr [name] is aware she takes extra Torsemide when needed.   Follow-up plan: ICM clinic phone appointment on 02/06/2022 to recheck fluid levels.   91 day device clinic remote transmission 04/02/2022.     EP/Cardiology Office Visits:  03/24/2022 with Dr [name].   Copy of ICM check sent to Dr. Rayann Heman and Dr [name] as Juluis Rainier and any further recommendations if needed.   3 month ICM trend:  01/30/2022.    12-14 Month ICM trend:     [name], RN 01/30/2022 3:35 PM

## 2022-02-06 ENCOUNTER — Ambulatory Visit (INDEPENDENT_AMBULATORY_CARE_PROVIDER_SITE_OTHER): Payer: BC Managed Care – PPO

## 2022-02-06 DIAGNOSIS — I5022 Chronic systolic (congestive) heart failure: Secondary | ICD-10-CM

## 2022-02-06 DIAGNOSIS — Z95810 Presence of automatic (implantable) cardiac defibrillator: Secondary | ICD-10-CM

## 2022-02-07 ENCOUNTER — Telehealth: Payer: Self-pay

## 2022-02-07 NOTE — Progress Notes (Signed)
EPIC Encounter for ICM Monitoring  Patient Name: Brittany [name] is a 61 y.o. female Date: 02/07/2022 Primary Care Physican: Maude Leriche, [place] Primary Cardiologist: [place] Electrophysiologist: Allred 12/19/2021 Weight: 101 lbs  01/27/2022 Weight: 97 lbs  01/30/2022 Weight: 99 lbs (increased 2 lbs)  02/07/2022 Weight: 100 lbs   Spoke with patient and heart failure questions reviewed.  Pt reports breathing is back at baseline and she is sleeping lying flat now.   Optivol Thoracic impedance suggesting fluid levels returned to normal after taking extra Torsemide.      Prescribed: Torsemide 20 mg Take 2 tablets (40 mg total) daily.  Per pt, Brittany [name] has approved her taking extra 20 mg Torsemide when needed.  Potassium 20 mEq take 1 tablets (20 mEq total) by mouth as needed Metolazone 2.5 mg take by mouth as needed for fluid  Spironolactone 25 mg take 0.5 tablet by mouth daily Farxiga 10 mg take 1 tablet daily   Labs: 12/14/2021 Creatinine 1.25, BUN 29, Potassium 4.5, Sodium 134, GFR 49 12/13/2021 Creatinine 1.57, BUN 28, Potassium 3.4, Sodium 133, GFR 37  12/12/2021 Creatinine 1.41, BUN 16, Potassium 3.4, Sodium 135, GFR 42  12/11/2021 Creatinine 1.18, BUN 12, Potassium 3.6, Sodium 138, GFR 52  12/10/2021 Creatinine 1.19, BUN 15, Potassium 3.4, Sodium 138, GFR 52  10/10/2021 Creatinine 1.22, BUN 47, Potassium 3.2, Sodium 134, GFR 50 A complete set of results can be found in Results Review.   Recommendations:   No changes and encouraged to call if experiencing any fluid symptoms.   Follow-up plan: ICM clinic phone appointment on 03/06/2022.   91 day device clinic remote transmission 04/02/2022.     EP/Cardiology Office Visits:  03/24/2022 with Brittany [name].   Copy of ICM check sent to Brittany. Rayann Heman   3 month ICM trend: 02/07/2022.    12-14 Month ICM trend:     [name], RN 02/07/2022 2:03 PM

## 2022-02-07 NOTE — Telephone Encounter (Signed)
Spoke with patient and requested to send remote transmission to recheck fluid levels.  She will send a report in the next half hour.

## 2022-02-07 NOTE — Telephone Encounter (Signed)
Attempted call to patient to request to send manual remote transmission and mail box is full.

## 2022-02-09 ENCOUNTER — Telehealth: Payer: Self-pay | Admitting: Student

## 2022-02-09 NOTE — Telephone Encounter (Signed)
Pt reaching out in regards to procedure.. has a few questions... please advise ?

## 2022-02-09 NOTE — Telephone Encounter (Signed)
Patient states that she is interested in having the [facility] device. She states previously her insurance would not cover it but she has seen advertisements lately and the coding has changed so more insurance companies are accepting it. She would like to know what next steps are to proceed with having this done.  ?

## 2022-02-10 NOTE — Telephone Encounter (Signed)
Spoke with the patient and advised her on information from [facility] Kilts that she does not qualify for the device due to her lab work. Patient became very upset stating that she was previously told the only thing preventing her from getting the device was insurance. She states now that insurance is no longer a problem she doesn't understand why she can't get it. I explained to her that it was due to her BNP levels being elevated. Unfortunately I could not provide her with enough information as to why this excludes her as I am not familiar with the device and criteria for it. She would like some more information.  ?

## 2022-02-15 NOTE — Telephone Encounter (Signed)
Spoke with the patient and gave her information from [place] on reasoning for not qualifying for the [place] device at this time. Patient verbalized understanding and thanked me for the information.  ?

## 2022-03-13 ENCOUNTER — Ambulatory Visit (INDEPENDENT_AMBULATORY_CARE_PROVIDER_SITE_OTHER): Payer: BC Managed Care – PPO

## 2022-03-13 DIAGNOSIS — I5022 Chronic systolic (congestive) heart failure: Secondary | ICD-10-CM | POA: Diagnosis not present

## 2022-03-13 DIAGNOSIS — Z95810 Presence of automatic (implantable) cardiac defibrillator: Secondary | ICD-10-CM

## 2022-03-15 ENCOUNTER — Telehealth: Payer: Self-pay

## 2022-03-15 NOTE — Telephone Encounter (Signed)
Remote ICM transmission received.  Attempted call to patient regarding ICM remote transmission and no answer or voice mail box set up ? ?

## 2022-03-15 NOTE — Progress Notes (Signed)
EPIC Encounter for ICM Monitoring ? ?Patient Name: Brittany [name] is a 61 y.o. female ?Date: 03/15/2022 ?Primary Care Physican: Scifres, [name], PA-C ?Primary Care Physican: Scifres, [name], PA-C ?Primary Cardiologist: [place] ?Electrophysiologist: [place] ?02/07/2022 Weight: 100 lbs ?  ?Attempted call to patient and unable to reach, no mail box.  Transmission reviewed.  ?  ?Optivol Thoracic impedance normal but was suggesting intermittent days with possible fluid from 2/27-3/13    ?  ?Prescribed: ?Torsemide 20 mg Take 2 tablets (40 mg total) daily.  Per pt, Dr [name] has approved her taking extra 20 mg Torsemide when needed.  ?Potassium 20 mEq take 1 tablets (20 mEq total) by mouth as needed ?Metolazone 2.5 mg take by mouth as needed for fluid  ?Spironolactone 25 mg take 0.5 tablet by mouth daily ?Farxiga 10 mg take 1 tablet daily ?  ?Labs: ?12/14/2021 Creatinine 1.25, BUN 29, Potassium 4.5, Sodium 134, GFR 49 ?12/13/2021 Creatinine 1.57, BUN 28, Potassium 3.4, Sodium 133, GFR 37  ?12/12/2021 Creatinine 1.41, BUN 16, Potassium 3.4, Sodium 135, GFR 42  ?12/11/2021 Creatinine 1.18, BUN 12, Potassium 3.6, Sodium 138, GFR 52  ?12/10/2021 Creatinine 1.19, BUN 15, Potassium 3.4, Sodium 138, GFR 52  ?10/10/2021 Creatinine 1.22, BUN 47, Potassium 3.2, Sodium 134, GFR 50 ?A complete set of results can be found in Results Review. ?  ?Recommendations:   Unable to reach.   ?  ?Follow-up plan: ICM clinic phone appointment on 04/17/2022.   91 day device clinic remote transmission 04/04/2022.   ?  ?EP/Cardiology Office Visits:  03/24/2022 with Dr [name]. ?  ?Copy of ICM check sent to Dr. Rayann Heman. ? ?3 month ICM trend: 03/13/2022. ? ? ? ?12-14 Month ICM trend:  ? ? ? ?[name], RN ?03/15/2022 ?4:47 PM ? ?

## 2022-03-20 ENCOUNTER — Other Ambulatory Visit (HOSPITAL_COMMUNITY): Payer: Self-pay | Admitting: *Deleted

## 2022-03-20 MED ORDER — APIXABAN 5 MG PO TABS: 5.0000 mg | ORAL_TABLET | Freq: Two times a day (BID) | ORAL | 6 refills | Status: DC

## 2022-03-21 ENCOUNTER — Telehealth (HOSPITAL_COMMUNITY): Payer: Self-pay | Admitting: *Deleted

## 2022-03-21 ENCOUNTER — Encounter (HOSPITAL_COMMUNITY): Payer: Self-pay | Admitting: Emergency Medicine

## 2022-03-21 ENCOUNTER — Emergency Department (HOSPITAL_COMMUNITY): Payer: BC Managed Care – PPO

## 2022-03-21 ENCOUNTER — Emergency Department (HOSPITAL_COMMUNITY)
Admission: EM | Admit: 2022-03-21 | Discharge: 2022-03-21 | Disposition: A | Discharge status: Home / Self Care | Payer: BC Managed Care – PPO | Attending: Emergency Medicine | Admitting: Emergency Medicine

## 2022-03-21 DIAGNOSIS — R0602 Shortness of breath: Secondary | ICD-10-CM | POA: Diagnosis present

## 2022-03-21 DIAGNOSIS — E1122 Type 2 diabetes mellitus with diabetic chronic kidney disease: Secondary | ICD-10-CM | POA: Insufficient documentation

## 2022-03-21 DIAGNOSIS — E876 Hypokalemia: Secondary | ICD-10-CM | POA: Insufficient documentation

## 2022-03-21 DIAGNOSIS — J45909 Unspecified asthma, uncomplicated: Secondary | ICD-10-CM | POA: Insufficient documentation

## 2022-03-21 DIAGNOSIS — Z7901 Long term (current) use of anticoagulants: Secondary | ICD-10-CM | POA: Insufficient documentation

## 2022-03-21 DIAGNOSIS — I130 Hypertensive heart and chronic kidney disease with heart failure and stage 1 through stage 4 chronic kidney disease, or unspecified chronic kidney disease: Secondary | ICD-10-CM | POA: Insufficient documentation

## 2022-03-21 DIAGNOSIS — I509 Heart failure, unspecified: Secondary | ICD-10-CM | POA: Diagnosis not present

## 2022-03-21 DIAGNOSIS — J449 Chronic obstructive pulmonary disease, unspecified: Secondary | ICD-10-CM | POA: Diagnosis not present

## 2022-03-21 DIAGNOSIS — Z951 Presence of aortocoronary bypass graft: Secondary | ICD-10-CM | POA: Insufficient documentation

## 2022-03-21 DIAGNOSIS — Z20822 Contact with and (suspected) exposure to covid-19: Secondary | ICD-10-CM | POA: Insufficient documentation

## 2022-03-21 DIAGNOSIS — Z85118 Personal history of other malignant neoplasm of bronchus and lung: Secondary | ICD-10-CM | POA: Insufficient documentation

## 2022-03-21 DIAGNOSIS — N183 Chronic kidney disease, stage 3 unspecified: Secondary | ICD-10-CM | POA: Diagnosis not present

## 2022-03-21 DIAGNOSIS — I2510 Atherosclerotic heart disease of native coronary artery without angina pectoris: Secondary | ICD-10-CM | POA: Insufficient documentation

## 2022-03-21 LAB — RESP PANEL BY RT-PCR (FLU A&B, COVID) ARPGX2
Influenza A by PCR: NEGATIVE
Influenza B by PCR: NEGATIVE
SARS Coronavirus 2 by RT PCR: NEGATIVE

## 2022-03-21 LAB — CBC WITH DIFFERENTIAL/PLATELET
Abs Immature Granulocytes: 0.03 10*3/uL (ref 0.00–0.07)
Basophils Absolute: 0.1 10*3/uL (ref 0.0–0.1)
Basophils Relative: 1 %
Eosinophils Absolute: 0.1 10*3/uL (ref 0.0–0.5)
Eosinophils Relative: 1 %
HCT: 48.6 % — ABNORMAL HIGH (ref 36.0–46.0)
Hemoglobin: 15.6 g/dL — ABNORMAL HIGH (ref 12.0–15.0)
Immature Granulocytes: 0 %
Lymphocytes Relative: 17 %
Lymphs Abs: 1.3 10*3/uL (ref 0.7–4.0)
MCH: 30.6 pg (ref 26.0–34.0)
MCHC: 32.1 g/dL (ref 30.0–36.0)
MCV: 95.3 fL (ref 80.0–100.0)
Monocytes Absolute: 0.7 10*3/uL (ref 0.1–1.0)
Monocytes Relative: 9 %
Neutro Abs: 5.5 10*3/uL (ref 1.7–7.7)
Neutrophils Relative %: 72 %
Platelets: 203 10*3/uL (ref 150–400)
RBC: 5.1 MIL/uL (ref 3.87–5.11)
RDW: 20.5 % — ABNORMAL HIGH (ref 11.5–15.5)
WBC: 7.7 10*3/uL (ref 4.0–10.5)
nRBC: 0 % (ref 0.0–0.2)

## 2022-03-21 LAB — COMPREHENSIVE METABOLIC PANEL
ALT: 14 U/L (ref 0–44)
AST: 21 U/L (ref 15–41)
Albumin: 4.1 g/dL (ref 3.5–5.0)
Alkaline Phosphatase: 84 U/L (ref 38–126)
Anion gap: 11 (ref 5–15)
BUN: 19 mg/dL (ref 8–23)
CO2: 30 mmol/L (ref 22–32)
Calcium: 9.7 mg/dL (ref 8.9–10.3)
Chloride: 99 mmol/L (ref 98–111)
Creatinine, Ser: 1.17 mg/dL — ABNORMAL HIGH (ref 0.44–1.00)
GFR, Estimated: 53 mL/min — ABNORMAL LOW (ref >60)
Glucose, Bld: 174 mg/dL — ABNORMAL HIGH (ref 70–99)
Potassium: 3.2 mmol/L — ABNORMAL LOW (ref 3.5–5.1)
Sodium: 140 mmol/L (ref 135–145)
Total Bilirubin: 1 mg/dL (ref 0.3–1.2)
Total Protein: 7.2 g/dL (ref 6.5–8.1)

## 2022-03-21 LAB — BRAIN NATRIURETIC PEPTIDE: B Natriuretic Peptide: 1514.7 pg/mL — ABNORMAL HIGH (ref 0.0–100.0)

## 2022-03-21 LAB — TROPONIN I (HIGH SENSITIVITY)
Troponin I (High Sensitivity): 27 ng/L — ABNORMAL HIGH (ref <18)
Troponin I (High Sensitivity): 24 ng/L — ABNORMAL HIGH (ref <18)

## 2022-03-21 LAB — LACTIC ACID, PLASMA
Lactic Acid, Venous: 1.6 mmol/L (ref 0.5–1.9)
Lactic Acid, Venous: 1.7 mmol/L (ref 0.5–1.9)

## 2022-03-21 MED ORDER — ALBUTEROL SULFATE HFA 108 (90 BASE) MCG/ACT IN AERS: 2.0000 | INHALATION_SPRAY | Freq: Four times a day (QID) | RESPIRATORY_TRACT | 0 refills | Status: DC | PRN

## 2022-03-21 MED ORDER — ALBUTEROL SULFATE (2.5 MG/3ML) 0.083% IN NEBU
2.5000 mg | INHALATION_SOLUTION | Freq: Once | RESPIRATORY_TRACT | Status: AC
  Administered 2022-03-21: 2.5 mg via RESPIRATORY_TRACT
  Filled 2022-03-21: qty 3

## 2022-03-21 MED ORDER — ALBUTEROL SULFATE (5 MG/ML) 0.5% IN NEBU: 2.5000 mg | INHALATION_SOLUTION | Freq: Four times a day (QID) | RESPIRATORY_TRACT | 12 refills | Status: DC | PRN

## 2022-03-21 MED ORDER — FUROSEMIDE 10 MG/ML IJ SOLN
80.0000 mg | Freq: Once | INTRAMUSCULAR | Status: AC
  Administered 2022-03-21: 80 mg via INTRAVENOUS
  Filled 2022-03-21: qty 8

## 2022-03-21 MED ORDER — POTASSIUM CHLORIDE CRYS ER 20 MEQ PO TBCR
40.0000 meq | EXTENDED_RELEASE_TABLET | Freq: Once | ORAL | Status: AC
  Administered 2022-03-21: 40 meq via ORAL
  Filled 2022-03-21: qty 2

## 2022-03-21 NOTE — ED Provider Notes (Signed)
?[facility] ?Provider Note ? ? ?CSN: [id number] ?Arrival date & time: 03/21/22  1301 ? ?  ? ?History ? ?Chief Complaint  ?Patient presents with  ? Shortness of Breath  ? ? ?Brittany [name] is a 61 y.o. female with past medical history of CAD without pertinent history of COPD/asthma, CHF (12/2021 EF 20-25% ), CKD stage III, diabetes mellitus type 2, hyperlipidemia, atrial fibrillation (on Eliquis), hypertension, CAD status post CABG, small cell lung cancer.  Presents to the emergency department with a chief complaint of shortness of breath.  Patient reports that she has had shortness of breath of the last 3 days.  Shortness of breath has been getting progressively worse.  Patient states that she is having worsening of her shortness of breath when laying flat.  States that over the last 3 nights she has had to sleep upright and was unable to sleep last night due to her shortness of breath.  Patient has been having paroxysmal nocturnal dyspnea over the last 3 days.  Patient reports that she gave herself an extra dose of torsemide this morning.  Patient denies any swelling to abdomen or lower extremities however states that when she is in acute CHF she normally does not have these things.  Patient reports that she has not been able to wear self the last 3 mornings.  Patient is currently on 2 LPM of O2 via nasal cannula.  States that she has oxygen at home and only uses it as needed.  Patient states that she has not been using over the last 3 days however states that she probably should have. ? ?Patient reports that she has been having some chest discomfort beneath both breasts.  Patient states that this has been present over the last 3 days.  Pain does not radiate and is not severe per patient.  Patient does endorse occasional nausea and chills.  Additionally patient does endorse a cough which is nonproductive.  Does not have a cough at baseline. ? ?Denies any fever,  hemoptysis, leg swelling or tenderness, palpitations, abdominal pain, vomiting, diarrhea, dysuria, hematuria, urinary urgency, lightheadedness, syncope. ? ? ?Shortness of Breath ?Associated symptoms: cough   ?Associated symptoms: no abdominal pain, no chest pain, no fever, no headaches, no neck pain, no rash and no vomiting   ? ?  ? ?Home Medications ?Prior to Admission medications   ?Medication Sig Start Date End Date Taking? Authorizing Provider  ?albuterol (VENTOLIN HFA) 108 (90 Base) MCG/ACT inhaler Inhale 2 puffs into the lungs every 6 (six) hours as needed for wheezing or shortness of breath. 04/16/19   Bensimhon, Shaune Pascal, MD  ?apixaban (ELIQUIS) 5 MG TABS tablet Take 1 tablet (5 mg total) by mouth 2 (two) times daily. 03/20/22   Bensimhon, Shaune Pascal, MD  ?atorvastatin (LIPITOR) 80 MG tablet Take 1 tablet (80 mg total) by mouth daily at 6 PM. 07/03/18   Bensimhon, Shaune Pascal, MD  ?blood glucose meter kit and supplies KIT Dispense based on patient and insurance preference. Use up to four times daily as directed. 04/11/21   Bonnielee Haff, MD  ?CORLANOR 7.5 MG TABS tablet TAKE 1 TABLET (7.5 MG TOTAL) BY MOUTH 2 (TWO) TIMES DAILY WITH A MEAL. 10/10/21   Bensimhon, Shaune Pascal, MD  ?FARXIGA 10 MG TABS tablet TAKE 1 TABLET (10 MG TOTAL) BY MOUTH DAILY BEFORE BREAKFAST. 08/16/21   Bensimhon, Shaune Pascal, MD  ?Glucerna (GLUCERNA) LIQD Take 237 mLs by mouth daily in the afternoon.  [provider]  ?ipratropium-albuterol (DUONEB) 0.5-2.5 (3) MG/3ML SOLN Take 3 mLs by nebulization 2 (two) times daily as needed (for shortness of breath).    [provider]  ?metolazone (ZAROXOLYN) 2.5 MG tablet Take 2.5 mg by mouth daily as needed (edema).    [provider]  ?Multiple Vitamin (MULTIVITAMIN WITH MINERALS) TABS tablet Take 1 tablet by mouth daily. 12/15/21   Hongalgi, Lenis Dickinson, MD  ?nitroGLYCERIN (NITROSTAT) 0.4 MG SL tablet Place 1 tablet (0.4 mg total) under the tongue every 5 (five) minutes as needed for  chest pain. 02/14/19   Shirley Friar, PA-C  ?potassium chloride SA (KLOR-CON M) 20 MEQ tablet Take 20 mEq by mouth as needed.    [provider]  ?spironolactone (ALDACTONE) 25 MG tablet Take 0.5 tablets (12.5 mg total) by mouth daily. 03/01/21   Clegg, Amy D, NP  ?torsemide (DEMADEX) 20 MG tablet Take 2 tablets (40 mg total) by mouth daily. 12/15/21   Hongalgi, Lenis Dickinson, MD  ?   ? ?Allergies    ?Codeine   ? ?Review of Systems   ?Review of Systems  ?Constitutional:  Positive for chills. Negative for fever.  ?Respiratory:  Positive for cough and shortness of breath.   ?Cardiovascular:  Negative for chest pain, palpitations and leg swelling.  ?Gastrointestinal:  Positive for nausea. Negative for abdominal pain and vomiting.  ?Genitourinary:  Negative for difficulty urinating, dysuria, frequency, hematuria and urgency.  ?Musculoskeletal:  Negative for back pain and neck pain.  ?Skin:  Negative for color change and rash.  ?Neurological:  Negative for dizziness, syncope, light-headedness and headaches.  ?Psychiatric/Behavioral:  Negative for confusion.   ? ?Physical Exam ?Updated Vital Signs ?BP (!) 113/54 (BP Location: Left Arm)   Pulse 90   Temp 98.5 ?F (36.9 ?C)   Resp (!) 22   SpO2 97%  ?Physical Exam ?Vitals and nursing note reviewed.  ?Constitutional:   ?   General: She is not in acute distress. ?   Appearance: She is not ill-appearing, toxic-appearing or diaphoretic.  ?   Interventions: Nasal cannula in place.  ?   Comments: On 2 LPM of O2 via nasal cannula  ?HENT:  ?   Head: Normocephalic.  ?Eyes:  ?   General: No scleral icterus.    ?   Right eye: No discharge.     ?   Left eye: No discharge.  ?Cardiovascular:  ?   Rate and Rhythm: Normal rate.  ?   Pulses:     ?     Radial pulses are 2+ on the right side and 2+ on the left side.  ?Pulmonary:  ?   Effort: Tachypnea present. No bradypnea.  ?   Breath sounds: Wheezing and rhonchi present.  ?   Comments: Patient has coarse lung sounds to all lobes.   Minimal expiratory wheezing noted to all lobes.  Patient is speaking in shortened sentences. ?Abdominal:  ?   General: Abdomen is flat. There is no distension. There are no signs of injury.  ?   Palpations: Abdomen is soft. There is no mass or pulsatile mass.  ?   Tenderness: There is no abdominal tenderness. There is no guarding or rebound.  ?Musculoskeletal:  ?   Right lower leg: No swelling, deformity, lacerations, tenderness or bony tenderness. No edema.  ?   Left lower leg: No swelling, deformity, lacerations, tenderness or bony tenderness. No edema.  ?Skin: ?   General: Skin is warm and dry.  ?Neurological:  ?  General: No focal deficit present.  ?   Mental Status: She is alert.  ?Psychiatric:     ?   Behavior: Behavior is cooperative.  ? ? ?ED Results / Procedures / Treatments   ?Labs ?(all labs ordered are listed, but only abnormal results are displayed) ?Labs Reviewed  ?COMPREHENSIVE METABOLIC PANEL - Abnormal; Notable for the following components:  ?    Result Value  ? Potassium 3.2 (*)   ? Glucose, Bld 174 (*)   ? Creatinine, Ser 1.17 (*)   ? GFR, Estimated 53 (*)   ? All other components within normal limits  ?CBC WITH DIFFERENTIAL/PLATELET - Abnormal; Notable for the following components:  ? Hemoglobin 15.6 (*)   ? HCT 48.6 (*)   ? RDW 20.5 (*)   ? All other components within normal limits  ?BRAIN NATRIURETIC PEPTIDE - Abnormal; Notable for the following components:  ? B Natriuretic Peptide 1,514.7 (*)   ? All other components within normal limits  ?TROPONIN I (HIGH SENSITIVITY) - Abnormal; Notable for the following components:  ? Troponin I (High Sensitivity) 27 (*)   ? All other components within normal limits  ?TROPONIN I (HIGH SENSITIVITY) - Abnormal; Notable for the following components:  ? Troponin I (High Sensitivity) 24 (*)   ? All other components within normal limits  ?RESP PANEL BY RT-PCR (FLU A&B, COVID) ARPGX2  ?LACTIC ACID, PLASMA  ?LACTIC ACID, PLASMA  ? ? ?EKG ?EKG  Interpretation ? ?Date/Time:  Tuesday March 21 2022 13:07:09 EDT ?Ventricular Rate:  95 ?PR Interval:  150 ?QRS Duration: 96 ?QT Interval:  428 ?QTC Calculation: 537 ?R Axis:   168 ?Text Interpretation: Normal sinus rhythm Biatrial e

## 2022-03-21 NOTE — ED Provider Triage Note (Signed)
Emergency Medicine Provider Triage Evaluation Note ? ?Brittany [name] , a 61 y.o. female  was evaluated in triage.  Pt complains of shortness of breath.  She reports that she is having to sleep sitting up.  She has had shortness of breath for 3 days. She has a discomfort in her chest.  She hasn't been able to get a reading from her implanted pace maker.   ? ?Dr. [name] is her primary cardiologist.  ? ?Physical Exam  ?BP (!) 113/54 (BP Location: Left Arm)   Pulse 90   Temp 98.5 ?F (36.9 ?C)   Resp (!) 22   SpO2 97%  ?Gen:   Awake, no distress   ?Resp:  Normal effort  ?MSK:   Moves extremities without difficulty  ?Other:  Rhonchi bilaterally.  ? ?Medical Decision Making  ?Medically screening exam initiated at 1:39 PM.  Appropriate orders placed.  Brittany [name] was informed that the remainder of the evaluation will be completed by another provider, this initial triage assessment does not replace that evaluation, and the importance of remaining in the ED until their evaluation is complete. ? ? ?  ?Lorin Glass, PA-C ?03/21/22 1342 ? ?

## 2022-03-21 NOTE — Discharge Instructions (Signed)
You came to the emergency department today to be evaluated for your shortness of breath.  Your chest x-ray did not show any or signs of pneumonia.  Your BNP was elevated and this is likely due to an acute exacerbation of your CHF.  Please continue to take your torsemide medication as prescribed.  Please call your cardiologist tomorrow to schedule a follow-up appointment. ? ?While in the emergency department your potassium was found to be low.  Please use take your potassium prescription.   ? ?I have given you refills on your albuterol inhaler and albuterol nebulizer solution. ? ?Get help right away if: ?Your shortness of breath gets worse. ?You have shortness of breath when you are resting. ?You feel light-headed or you faint. ?You have a cough that is not controlled with medicines. ?You cough up blood. ?You have pain with breathing. ?You have pain in your chest, arms, shoulders, or abdomen. ?You have a fever. ?

## 2022-03-21 NOTE — Telephone Encounter (Signed)
Received call from patient.  She stated she has been told by HF clinic today to go to ER since she has not been feeling very well for several days.  She is having difficulty breathing and sitting up at night to breath better.  She has been staying with a friend due to not feeling well and wanted to send remote transmission for review.  Advised not to go home to send remote transmission and should go directly to the ER since congestion is audible over the phone.  Explained if a transmission shows fluid it will not change the fact she should still go to ER since she is not feeling well and having difficulty breathing.  She agreed to go to ER and not stop at home to send remote transmission.  Explained will check transmission after she is home from ER or hospital if she is admitted.   ?

## 2022-03-21 NOTE — Telephone Encounter (Signed)
Pt is not at home and can not send transmission. Pts breathing was worse when I called her. I could hear pt gasping for air and advised her to go to the emergency room. Pt agreeable and will go to the ED. ?

## 2022-03-21 NOTE — ED Notes (Signed)
Showed Dr. Sherry Ruffing pt's repeat EKG, monitor showing ST elevation. MD reviewed EKG. Per MD, no changes from prior EKG, okay to dc.  ?

## 2022-03-21 NOTE — Telephone Encounter (Signed)
Pt called c/o shortness of breath and swelling in her abdomen. Pt said she has to sit up when she sleeps to breathe. Pt also noticed wheezing and no productive cough. She took an extra torsemide this morning but wants to know if she needs to make any med changes. I asked if her weight was up and she said she does not weigh at home.  ? ?Routed to [facility] for advice ?

## 2022-03-21 NOTE — ED Triage Notes (Signed)
Patient here with complaint of shortness of breath that started several days ago, sent to ED by cardiology for further evaluation. Patient reports shortness of breath is worse when laying down, also complains chest discomfort with radiation into right arm. Patient is alert, oriented, and in no apparent distress at this time. ?

## 2022-03-22 ENCOUNTER — Telehealth: Payer: Self-pay

## 2022-03-22 ENCOUNTER — Ambulatory Visit (INDEPENDENT_AMBULATORY_CARE_PROVIDER_SITE_OTHER): Payer: BC Managed Care – PPO

## 2022-03-22 DIAGNOSIS — I5022 Chronic systolic (congestive) heart failure: Secondary | ICD-10-CM | POA: Diagnosis not present

## 2022-03-22 NOTE — Telephone Encounter (Signed)
Spoke with patient and transmission reviewed.  Advised report suggesting dryness today after being diuresed in ED. Advised there have been intermittent days of possible fluid accumulation in the last 10 days.   Advised to drink 64-70 oz fluid today or more if she feels dizzy or lightheaded which may be signs of dehydration.  Her breathing is back to normal and she does not have any respiratory infection.   She is feeling close to her baseline and will continue to monitor for symptoms of fluid accumulation or dehydration.   ?Advised will be out of the office for next 2 days and contact HF clinic for any changes in condition or return to ER if needed.   ? ? ?03/22/2022 Optivol Thoracic impedance suggesting slight dryness today.  ? ? ? ? ?

## 2022-03-22 NOTE — Telephone Encounter (Signed)
Spoke with patient for follow up after 4/11 ED visit.  She is feeling much better today after being diuresed during ED visit.  She requested review of remote transmission today to check fluid levels.  Will send remote transmission and advised will call back with results.  ?

## 2022-03-23 ENCOUNTER — Telehealth (HOSPITAL_COMMUNITY): Payer: Self-pay | Admitting: Licensed Clinical Social Worker

## 2022-03-23 LAB — CUP PACEART REMOTE DEVICE CHECK
Battery Remaining Longevity: 105 mo
Battery Voltage: 3.01 V
Brady Statistic RV Percent Paced: 0.01 %
Date Time Interrogation Session: 20230412114907
HighPow Impedance: 69 Ohm
Implantable Lead Implant Date: 20190905
Implantable Lead Location: 753860
Implantable Pulse Generator Implant Date: 20190905
Lead Channel Impedance Value: 342 Ohm
Lead Channel Impedance Value: 399 Ohm
Lead Channel Pacing Threshold Amplitude: 0.75 V
Lead Channel Pacing Threshold Pulse Width: 0.4 ms
Lead Channel Sensing Intrinsic Amplitude: 20.25 mV
Lead Channel Sensing Intrinsic Amplitude: 20.25 mV
Lead Channel Setting Pacing Amplitude: 2 V
Lead Channel Setting Pacing Pulse Width: 0.4 ms
Lead Channel Setting Sensing Sensitivity: 0.3 mV

## 2022-03-23 NOTE — Progress Notes (Signed)
?Heart and Vascular Care Navigation ? ?03/23/2022 ? ?Brittany [name] ?Jan 13, 1961 ?[id number] ? ?Reason for Referral: transportation ?  ?Engaged with patient by telephone for initial visit for Heart and Vascular Care Coordination. ?                                                                                                  ?Assessment: Pt left VM for staff inquiring about transportation to get to appt tomorrow.  CSW called pt to follow up.  Pt had been utilizing [employer] service to get to appts- informed her this service no longer available.  Pt reports she has [place], Medicare, and Medicaid.  Informed pt of Medicaid transportation benefit but that due to short notice would be unable to utilize.  She states she has used this in the past but that she feels uncomfortable using that service due to the condition of the vehicles.   ? ?CSW offered taxi voucher for appt tomorrow but informed pt that we would be unable to offer this service consistently and that she would need to utilize her insurance benefit to make it to future appts or arrange other ways to get to and from appts- pt expressed understanding                                    ? ?HRT/VAS Care Coordination   ? ? Living arrangements for the past 2 months Single Family Home  ? Lives with: Self  ? Home Assistive Devices/Equipment Walker (specify type)  ? DME Agency NA  ? [place] Agency NA  ? Current home services --  oxygen, rollator  ? ?  ? ? ?Social History:                                                                             ?SDOH Screenings  ? ?Alcohol Screen: Not on file  ?Depression (PHQ2-9): Not on file  ?Financial Resource Strain: Not on file  ?Food Insecurity: Not on file  ?Housing: Not on file  ?Physical Activity: Not on file  ?Social Connections: Not on file  ?Stress: Not on file  ?Tobacco Use: Medium Risk  ? Smoking Tobacco Use: Former  ? Smokeless Tobacco Use: Never  ? Passive Exposure: Not on file  ?Transportation Needs: Unmet  Transportation Needs  ? Lack of Transportation (Medical): Yes  ? Lack of Transportation (Non-Medical): No  ? ? ?SDOH Interventions: ?Financial Resources:    ?N/a  ?Food Insecurity:   N/a  ?Housing Insecurity:   N/a  ?Transportation:   Transportation Interventions: Chief Strategy Officer Given, Payor Benefit  ? ? ?Follow-up plan:   ? ? ?No further needs at this time ? ?Jorge Ny, LCSW ?Clinical Social Worker ?[facility] ?Desk#: [phone] ?Cell#: [phone] ? ? ?

## 2022-03-24 ENCOUNTER — Ambulatory Visit (HOSPITAL_COMMUNITY)
Admission: RE | Admit: 2022-03-24 | Discharge: 2022-03-24 | Disposition: A | Discharge status: Home / Self Care | Payer: BC Managed Care – PPO | Source: Ambulatory Visit | Attending: Internal Medicine | Admitting: Internal Medicine

## 2022-03-24 ENCOUNTER — Encounter (HOSPITAL_COMMUNITY): Payer: Self-pay | Admitting: Internal Medicine

## 2022-03-24 VITALS — BP 120/70 | HR 96 | Wt 101.4 lb

## 2022-03-24 DIAGNOSIS — Z95810 Presence of automatic (implantable) cardiac defibrillator: Secondary | ICD-10-CM

## 2022-03-24 DIAGNOSIS — J9611 Chronic respiratory failure with hypoxia: Secondary | ICD-10-CM | POA: Diagnosis not present

## 2022-03-24 DIAGNOSIS — I110 Hypertensive heart disease with heart failure: Secondary | ICD-10-CM | POA: Insufficient documentation

## 2022-03-24 DIAGNOSIS — Z7901 Long term (current) use of anticoagulants: Secondary | ICD-10-CM | POA: Diagnosis not present

## 2022-03-24 DIAGNOSIS — J90 Pleural effusion, not elsewhere classified: Secondary | ICD-10-CM | POA: Diagnosis not present

## 2022-03-24 DIAGNOSIS — I480 Paroxysmal atrial fibrillation: Secondary | ICD-10-CM | POA: Diagnosis not present

## 2022-03-24 DIAGNOSIS — I5022 Chronic systolic (congestive) heart failure: Secondary | ICD-10-CM | POA: Insufficient documentation

## 2022-03-24 DIAGNOSIS — J449 Chronic obstructive pulmonary disease, unspecified: Secondary | ICD-10-CM | POA: Diagnosis not present

## 2022-03-24 DIAGNOSIS — I2510 Atherosclerotic heart disease of native coronary artery without angina pectoris: Secondary | ICD-10-CM | POA: Insufficient documentation

## 2022-03-24 DIAGNOSIS — Z79899 Other long term (current) drug therapy: Secondary | ICD-10-CM | POA: Insufficient documentation

## 2022-03-24 DIAGNOSIS — N280 Ischemia and infarction of kidney: Secondary | ICD-10-CM

## 2022-03-24 DIAGNOSIS — L905 Scar conditions and fibrosis of skin: Secondary | ICD-10-CM | POA: Insufficient documentation

## 2022-03-24 DIAGNOSIS — I252 Old myocardial infarction: Secondary | ICD-10-CM | POA: Insufficient documentation

## 2022-03-24 DIAGNOSIS — Z951 Presence of aortocoronary bypass graft: Secondary | ICD-10-CM | POA: Insufficient documentation

## 2022-03-24 DIAGNOSIS — Z87891 Personal history of nicotine dependence: Secondary | ICD-10-CM | POA: Diagnosis not present

## 2022-03-24 LAB — BASIC METABOLIC PANEL
Anion gap: 10 (ref 5–15)
BUN: 15 mg/dL (ref 8–23)
CO2: 26 mmol/L (ref 22–32)
Calcium: 9.4 mg/dL (ref 8.9–10.3)
Chloride: 103 mmol/L (ref 98–111)
Creatinine, Ser: 1.04 mg/dL — ABNORMAL HIGH (ref 0.44–1.00)
GFR, Estimated: >60 mL/min (ref >60)
Glucose, Bld: 89 mg/dL (ref 70–99)
Potassium: 3.6 mmol/L (ref 3.5–5.1)
Sodium: 139 mmol/L (ref 135–145)

## 2022-03-24 LAB — CBC
HCT: 48.3 % — ABNORMAL HIGH (ref 36.0–46.0)
Hemoglobin: 15.9 g/dL — ABNORMAL HIGH (ref 12.0–15.0)
MCH: 30.9 pg (ref 26.0–34.0)
MCHC: 32.9 g/dL (ref 30.0–36.0)
MCV: 93.8 fL (ref 80.0–100.0)
Platelets: 203 10*3/uL (ref 150–400)
RBC: 5.15 MIL/uL — ABNORMAL HIGH (ref 3.87–5.11)
RDW: 20.2 % — ABNORMAL HIGH (ref 11.5–15.5)
WBC: 5.6 10*3/uL (ref 4.0–10.5)
nRBC: 0 % (ref 0.0–0.2)

## 2022-03-24 LAB — BRAIN NATRIURETIC PEPTIDE: B Natriuretic Peptide: 650.7 pg/mL — ABNORMAL HIGH (ref 0.0–100.0)

## 2022-03-24 NOTE — Patient Instructions (Signed)
There has been no changes to your medications. ? ?Labs done today, your results will be available in MyChart, we will contact you for abnormal readings. ? ?PLEASE GET COVID VACCINE  ? ?Your physician recommends that you schedule a follow-up appointment in: 4 months  ? ?If you have any questions or concerns before your next appointment please send [place] a message through [place] or call our office at [phone].   ? ?TO LEAVE A MESSAGE FOR THE NURSE SELECT OPTION 2, PLEASE LEAVE A MESSAGE INCLUDING: ?YOUR NAME ?DATE OF BIRTH ?CALL BACK NUMBER ?REASON FOR CALL**this is important as we prioritize the call backs ? ?YOU WILL RECEIVE A CALL BACK THE SAME DAY AS LONG AS YOU CALL BEFORE 4:00 PM ? ?At the [facility], you and your health needs are our priority. As part of our continuing mission to provide you with exceptional heart care, we have created designated Provider Care Teams. These Care Teams include your primary Cardiologist (physician) and Advanced Practice Providers (APPs- Physician Assistants and Nurse Practitioners) who all work together to provide you with the care you need, when you need it.  ? ?You may see any of the following providers on your designated Care Team at your next follow up: ?Dr Glori Bickers ?Dr Loralie Champagne ?Darrick Grinder, NP ?Lyda Jester, PA ?Jessica [name],NP ?Marlyce Huge, PA ?Audry Riles, PharmD ? ? ?Please be sure to bring in all your medications bottles to every appointment.  ? ? ?

## 2022-03-24 NOTE — Addendum Note (Signed)
Encounter addended by: Jerl Mina, RN on: 03/24/2022 12:31 PM ? Actions taken: Order list changed, Diagnosis association updated, Charge Capture section accepted, Clinical Note Signed

## 2022-03-24 NOTE — Progress Notes (Signed)
ReDS Vest / Clip - 03/24/22 1200   ? ?  ? ReDS Vest / Clip  ? Station Marker A   ? Ruler Value 25   ? ReDS Value Range High volume overload   ? ReDS Actual Value 43   ? ?  ?  ? ?  ? ? ?

## 2022-03-24 NOTE — Progress Notes (Signed)
?  ? ?Advanced HF Clinic Note ? ? ?PCP:  Scifres, Dorothy, PA-C  ?Cardiologist:  None ?Primary HF: Dr [name] ?Oncology: Dr [name]  ? ?Chief Complaint: Heart Failure ? ?History of Present Illness: ?Brittany [name] is a 61 y.o. female with a history of PAF,  previous smoker quit 2015  years ago, hypertension, previous small cell lung cancer treated with chemo, chest XRT and prophylactic brain radiation in 2015, CAD s/p CABG and chronic systolic HF EF ~51% ? ?Admitted 02/03/2018 with NSTEMI and shock. Underwent emergent cath 02/03/18 showed LAD 100% stenosed, LCx 95% stenosed. Taken for emergent CABG 02/03/18. Required impella post op. Hospital course complicated by cardiogenic shock, HCAP, A fib, respiratory failure, and swallowing issues. She was discharged to SNF. Discharge weight 103 pounds.  ?  ?In 2019 had multiple hospitalizations for HF and pleural effusion. Underwent pleurodesis at [facility]. ?  ?Admitted 3/20 with NSTEMI and HF. Received DES to ostial ramus into distal left main based on cath below and underwent diuresis. Meds adjusted as tolerated. Echo with EF 25-30%.  ? ?Admitted 11/21 with COPD flare with mild HF and hemoptysis. CTA showed stable scarring in the right hilum consistent with emphysema.. Underwent bronchoscopy on 11/15, no obvious source of bleeding found on examination.  ? ?Echo 11/21 EF 20-25%. RV mildly HK.  ? ?Admitted 3/22 with chest pain and shortness of breath. HS-Trop with no trend. Echo with EF < 20%. Treated for AECOPD exacerbation. Breathing improved. Discharged on torsemide 60 mg daily. ? ?Admitted 4/22 with ADHF. Diuresed with IV lasix and transitioned back to torsemide 80 mg daily x 4 days then down to torsemide 60 mg daily.  ? ?Admitted 12/22 with R renal infarct felt to be cardio-embolic (no AF found). Started on Eliquis ? ?Was seen in ER last week for worsening SOB. CXR ok. Given 1 dose of IV lasix and nebs. Now breathing better. Still SOB with activities. Occasional  CP. No edema. Says last night was going to bathroom and legs just gave out. Denies smoking but smells like smoke. Follows closely with Sharman Cheek and torsemide down to 40 daily (was 60 daily  ? ?ReDS 42% (last reading 44%) ? ?ICD interrogation: No VT/AF. Fluid low. Activity level 2.5 hr/day (up from previous) Personally reviewed ? ?Cardidac Studies: ?ECHO 2022: EF < 20% RV normal  ?ECHO 2021: EF 20-25% RV mildly reduced.  ?ECHO 02/12/19: EF 25-30%, RV mildly reduced ?ECHO 02/10/21: EF <20%, G2DD ? ?05/18/2021 PFT's: moderately severe airway obstruction and a diffusion defect suggesting emphysema. ?  ?LHC 02/12/19: ?Ost LAD to Prox LAD lesion is 100% stenosed. ?Ost Cx to Prox Cx lesion is 100% stenosed. ?Ost Ramus lesion is 99% stenosed. ?Post intervention, there is a 0% residual stenosis. ?A drug-eluting stent was successfully placed using a [place] [id number]. ?SVG and is normal in caliber. ?The graft exhibits no disease. ?Ost 1st Diag lesion is 70% stenosed. ?SVG and is normal in caliber. ?Prox Graft lesion is 100% stenosed. ?1.  Significant underlying two-vessel coronary artery disease with patent SVG to OM and SVG to diagonal which supplies the LAD territory.  Atretic LIMA to LAD given competitive flow from SVG to diagonal.  Severe ostial ramus artery stenosis not supplied by bypass with his area suggestive of plaque rupture. ?2.  Severely elevated left ventricular end-diastolic pressure 34 mmHg.  Left ventricular angiography was not performed. ?3.  Successful angioplasty and drug-eluting stent placement to the ostial ramus extending into the distal left main. ? ?[place] 2019  ?  RA = 8 ?RV = 49/9 ?PA = 47/20 (32) ?PCW = 19 ?Fick cardiac output/index = 4.3/3.1 ?PVR = 3.0 wu ?AO sat = 93% ?PA sat = 62%, 64%  ?Assessment: ?1. Minimally elevated left heart pressures ?2. Mild PAH ?3. Normal cardiac output ? ? ? ? ?Past Medical History:  ?Diagnosis Date  ? Acute respiratory failure ([place]) 05/05/2018  ? Acute systolic  congestive heart failure ([place]) 02/03/2018  ? Allergy   ? Anxiety   ? Asthma   ? DM2 (diabetes mellitus, type 2) ([place]) 10/20/2020  ? Hypertension   ? PAF (paroxysmal atrial fibrillation) ([place])   ? Prophylactic measure 08/03/14-08/19/14  ? Prophyl. cranial radiation 24 Gy  ? S/P emergency CABG x 3 02/03/2018  ? LIMA to LAD, SVG to D1, SVG to OM1, EVH via right thigh with implantation of Impella LD LVAD via direct aortic approach  ? Small cell lung cancer ([place]) 03/16/2014  ? ?Past Surgical History:  ?Procedure Laterality Date  ? BRONCHIAL BRUSHINGS  10/25/2020  ? Procedure: BRONCHIAL BRUSHINGS;  Surgeon: Collene Gobble, MD;  Location: [facility] ENDOSCOPY;  Service: Pulmonary;;  ? BRONCHIAL NEEDLE ASPIRATION BIOPSY  10/25/2020  ? Procedure: BRONCHIAL NEEDLE ASPIRATION BIOPSIES;  Surgeon: Collene Gobble, MD;  Location: [facility] ENDOSCOPY;  Service: Pulmonary;;  ? [place]  08/15/2018  ? MDT Visia AF MRI VR ICD implanted by Dr Loel Lofty for primary prevention of sudden  ? CESAREAN SECTION    ? CORONARY ARTERY BYPASS GRAFT N/A 02/03/2018  ? Procedure: CORONARY ARTERY BYPASS GRAFTING (CABG);  Surgeon: Rexene Alberts, MD;  Location: [place];  Service: Open Heart Surgery;  Laterality: N/A;  Time 3 using left internal mammary artery and endoscopically harvested right saphenous vein  ? CORONARY BALLOON ANGIOPLASTY N/A 02/03/2018  ? Procedure: CORONARY BALLOON ANGIOPLASTY;  Surgeon: Martinique, Peter M, MD;  Location: [place] CV LAB;  Service: Cardiovascular;  Laterality: N/A;  ? CORONARY STENT INTERVENTION N/A 02/12/2019  ? Procedure: CORONARY STENT INTERVENTION;  Surgeon: Wellington Hampshire, MD;  Location: [place] CV LAB;  Service: Cardiovascular;  Laterality: N/A;  ? CORONARY/GRAFT ACUTE MI REVASCULARIZATION N/A 02/03/2018  ? Procedure: Coronary/Graft Acute MI Revascularization;  Surgeon: Martinique, Peter M, MD;  Location: [place] CV LAB;  Service: Cardiovascular;  Laterality: N/A;  ? ENDOBRONCHIAL ULTRASOUND N/A  10/25/2020  ? Procedure: ENDOBRONCHIAL ULTRASOUND;  Surgeon: Collene Gobble, MD;  Location: [facility] ENDOSCOPY;  Service: Pulmonary;  Laterality: N/A;  ? FLEXIBLE BRONCHOSCOPY  10/25/2020  ? Procedure: FLEXIBLE BRONCHOSCOPY;  Surgeon: Collene Gobble, MD;  Location: [facility] ENDOSCOPY;  Service: Pulmonary;;  ? IABP INSERTION N/A 02/03/2018  ? Procedure: IABP Insertion;  Surgeon: Martinique, Peter M, MD;  Location: [place] CV LAB;  Service: Cardiovascular;  Laterality: N/A;  ? INTRAOPERATIVE TRANSESOPHAGEAL ECHOCARDIOGRAM N/A 02/03/2018  ? Procedure: INTRAOPERATIVE TRANSESOPHAGEAL ECHOCARDIOGRAM;  Surgeon: Rexene Alberts, MD;  Location: [place];  Service: Open Heart Surgery;  Laterality: N/A;  ? LEFT HEART CATH AND CORONARY ANGIOGRAPHY N/A 02/03/2018  ? Procedure: LEFT HEART CATH AND CORONARY ANGIOGRAPHY;  Surgeon: Martinique, Peter M, MD;  Location: [place] CV LAB;  Service: Cardiovascular;  Laterality: N/A;  ? LEFT HEART CATH AND CORS/GRAFTS ANGIOGRAPHY N/A 02/12/2019  ? Procedure: LEFT HEART CATH AND CORS/GRAFTS ANGIOGRAPHY;  Surgeon: Wellington Hampshire, MD;  Location: [place] CV LAB;  Service: Cardiovascular;  Laterality: N/A;  ? MEDIASTINOSCOPY N/A 03/11/2014  ? Procedure: MEDIASTINOSCOPY;  Surgeon: Melrose Nakayama, MD;  Location: [place];  Service:  Thoracic;  Laterality: N/A;  ? PLACEMENT OF IMPELLA LEFT VENTRICULAR ASSIST DEVICE  02/03/2018  ? Procedure: PLACEMENT OF [place] LEFT VENTRICULAR ASSIST DEVICE LD;  Surgeon: Rexene Alberts, MD;  Location: [place];  Service: Open Heart Surgery;;  ? REMOVAL OF IMPELLA LEFT VENTRICULAR ASSIST DEVICE N/A 02/08/2018  ? Procedure: REMOVAL OF [place] LEFT VENTRICULAR ASSIST DEVICE;  Surgeon: Rexene Alberts, MD;  Location: [place];  Service: Open Heart Surgery;  Laterality: N/A;  ? RIGHT HEART CATH N/A 02/03/2018  ? Procedure: RIGHT HEART CATH;  Surgeon: Martinique, Peter M, MD;  Location: [place] CV LAB;  Service: Cardiovascular;  Laterality: N/A;  ? RIGHT HEART CATH N/A 05/09/2018  ?  Procedure: RIGHT HEART CATH;  Surgeon: Jolaine Artist, MD;  Location: [place] CV LAB;  Service: Cardiovascular;  Laterality: N/A;  ? TEE WITHOUT CARDIOVERSION N/A 02/08/2018  ? Procedure: TRANSESOPHAGEAL [facility]

## 2022-03-27 ENCOUNTER — Ambulatory Visit (INDEPENDENT_AMBULATORY_CARE_PROVIDER_SITE_OTHER): Payer: BC Managed Care – PPO

## 2022-03-27 ENCOUNTER — Telehealth: Payer: Self-pay

## 2022-03-27 DIAGNOSIS — Z95810 Presence of automatic (implantable) cardiac defibrillator: Secondary | ICD-10-CM

## 2022-03-27 DIAGNOSIS — I5022 Chronic systolic (congestive) heart failure: Secondary | ICD-10-CM

## 2022-03-27 NOTE — Progress Notes (Signed)
EPIC Encounter for ICM Monitoring ? ?Patient Name: Brittany [name] is a 61 y.o. female ?Date: 03/27/2022 ?Primary Care Physican: Scifres, [name], PA-C ?Primary Cardiologist: Nora ?Electrophysiologist: Allred ?03/24/2022 Weight: 101 lbs ?  ?Spoke with patient and heart failure questions reviewed.  Pt is feeling much better and thinks she is back on track with fluid levels.  ?  ?Optivol Thoracic impedance normal.   ?  ?Prescribed: ?Torsemide 20 mg Take 2 tablets (40 mg total) daily.  Per pt, Dr [name] has approved her taking extra 20 mg Torsemide when needed.  ?Potassium 20 mEq take 1 tablets (20 mEq total) by mouth as needed ?Metolazone 2.5 mg take by mouth as needed for fluid  ?Spironolactone 25 mg take 0.5 tablet by mouth daily ?Farxiga 10 mg take 1 tablet daily ?  ?Labs: ?03/24/2022 Creatinine 1.04, BUN 15, Potassium 3.6, Sodium 139, GFR >60 ?03/21/2022 Creatinine 1.17, BUN 19, Potassium 3.2, Sodium 140, GFR 53  ?12/14/2021 Creatinine 1.25, BUN 29, Potassium 4.5, Sodium 134, GFR 49 ?12/13/2021 Creatinine 1.57, BUN 28, Potassium 3.4, Sodium 133, GFR 37  ?12/12/2021 Creatinine 1.41, BUN 16, Potassium 3.4, Sodium 135, GFR 42  ?12/11/2021 Creatinine 1.18, BUN 12, Potassium 3.6, Sodium 138, GFR 52  ?12/10/2021 Creatinine 1.19, BUN 15, Potassium 3.4, Sodium 138, GFR 52  ?10/10/2021 Creatinine 1.22, BUN 47, Potassium 3.2, Sodium 134, GFR 50 ?A complete set of results can be found in Results Review. ?  ?Recommendations:   No changes and encouraged to call if experiencing any fluid symptoms. ?  ?Follow-up plan: ICM clinic phone appointment on 04/17/2022.   91 day device clinic remote transmission 06/21/2022.   ?  ?EP/Cardiology Office Visits:  07/24/2022 with Dr [name]. ?  ?Copy of ICM check sent to Dr. Rayann Heman.  ? ?3 month ICM trend: 03/27/2022. ? ? ? ?12-14 Month ICM trend:  ? ? ? ?[name], RN ?03/27/2022 ?3:48 PM ? ?

## 2022-03-27 NOTE — Telephone Encounter (Signed)
Spoke with patient and requested to send remote transmission to recheck fluid fluid levels.  She agreed to send remote transmission for review.  ? ?

## 2022-04-05 ENCOUNTER — Telehealth: Payer: Self-pay

## 2022-04-05 NOTE — Telephone Encounter (Signed)
Spoke with patient and she requested a review of fluid levels today because she is feeling congested.  She reports it is difficult to tell what is fluid accumulation versus COPD symptoms and using inhalers as prescribed.  She has been taking extra 20 mg Torsemide for past 2 days due to fluid symptoms of congestion and SOB.  Remote transmission reviewed and discussed with patient.  Report suggesting possible fluid accumulation starting 4/18 and returned close to baseline.  Since she continues to feels fluid congestion she will take 1 more day of extra 20 mg Torsemide.  Will recheck fluid levels on 5/1 and advised to call back if condition changes.   ? ? ?04/05/2022 [place] suggesting possible fluid accumulation starting 4/18 and returned closer to baseline today.   ? ? ?

## 2022-04-07 NOTE — Progress Notes (Signed)
Remote ICD transmission.   

## 2022-04-09 ENCOUNTER — Inpatient Hospital Stay (HOSPITAL_COMMUNITY)
Admission: EM | Admit: 2022-04-09 | Discharge: 2022-04-16 | DRG: 252 | Disposition: A | Discharge status: Home / Self Care | Payer: BC Managed Care – PPO | Attending: Internal Medicine | Admitting: Internal Medicine

## 2022-04-09 ENCOUNTER — Other Ambulatory Visit: Payer: Self-pay

## 2022-04-09 ENCOUNTER — Emergency Department (HOSPITAL_COMMUNITY): Payer: BC Managed Care – PPO

## 2022-04-09 ENCOUNTER — Encounter (HOSPITAL_COMMUNITY): Payer: Self-pay | Admitting: Emergency Medicine

## 2022-04-09 DIAGNOSIS — J439 Emphysema, unspecified: Secondary | ICD-10-CM | POA: Diagnosis present

## 2022-04-09 DIAGNOSIS — Z85118 Personal history of other malignant neoplasm of bronchus and lung: Secondary | ICD-10-CM

## 2022-04-09 DIAGNOSIS — I480 Paroxysmal atrial fibrillation: Secondary | ICD-10-CM | POA: Diagnosis present

## 2022-04-09 DIAGNOSIS — Z7984 Long term (current) use of oral hypoglycemic drugs: Secondary | ICD-10-CM | POA: Diagnosis not present

## 2022-04-09 DIAGNOSIS — E1122 Type 2 diabetes mellitus with diabetic chronic kidney disease: Secondary | ICD-10-CM | POA: Diagnosis present

## 2022-04-09 DIAGNOSIS — N1832 Chronic kidney disease, stage 3b: Secondary | ICD-10-CM | POA: Diagnosis present

## 2022-04-09 DIAGNOSIS — Y713 Surgical instruments, materials and cardiovascular devices (including sutures) associated with adverse incidents: Secondary | ICD-10-CM | POA: Diagnosis not present

## 2022-04-09 DIAGNOSIS — Z955 Presence of coronary angioplasty implant and graft: Secondary | ICD-10-CM

## 2022-04-09 DIAGNOSIS — Z681 Body mass index (BMI) 19 or less, adult: Secondary | ICD-10-CM | POA: Diagnosis not present

## 2022-04-09 DIAGNOSIS — I5043 Acute on chronic combined systolic (congestive) and diastolic (congestive) heart failure: Secondary | ICD-10-CM | POA: Diagnosis present

## 2022-04-09 DIAGNOSIS — Z7901 Long term (current) use of anticoagulants: Secondary | ICD-10-CM

## 2022-04-09 DIAGNOSIS — J9601 Acute respiratory failure with hypoxia: Secondary | ICD-10-CM

## 2022-04-09 DIAGNOSIS — R54 Age-related physical debility: Secondary | ICD-10-CM | POA: Diagnosis present

## 2022-04-09 DIAGNOSIS — D62 Acute posthemorrhagic anemia: Secondary | ICD-10-CM | POA: Diagnosis not present

## 2022-04-09 DIAGNOSIS — Z95810 Presence of automatic (implantable) cardiac defibrillator: Secondary | ICD-10-CM

## 2022-04-09 DIAGNOSIS — Z8249 Family history of ischemic heart disease and other diseases of the circulatory system: Secondary | ICD-10-CM

## 2022-04-09 DIAGNOSIS — I959 Hypotension, unspecified: Secondary | ICD-10-CM | POA: Diagnosis not present

## 2022-04-09 DIAGNOSIS — E1169 Type 2 diabetes mellitus with other specified complication: Secondary | ICD-10-CM | POA: Diagnosis present

## 2022-04-09 DIAGNOSIS — I5023 Acute on chronic systolic (congestive) heart failure: Secondary | ICD-10-CM | POA: Diagnosis present

## 2022-04-09 DIAGNOSIS — Y658 Other specified misadventures during surgical and medical care: Secondary | ICD-10-CM | POA: Diagnosis not present

## 2022-04-09 DIAGNOSIS — N1831 Chronic kidney disease, stage 3a: Secondary | ICD-10-CM | POA: Diagnosis present

## 2022-04-09 DIAGNOSIS — Z885 Allergy status to narcotic agent status: Secondary | ICD-10-CM | POA: Diagnosis not present

## 2022-04-09 DIAGNOSIS — R9431 Abnormal electrocardiogram [ECG] [EKG]: Secondary | ICD-10-CM | POA: Diagnosis not present

## 2022-04-09 DIAGNOSIS — Z20822 Contact with and (suspected) exposure to covid-19: Secondary | ICD-10-CM | POA: Diagnosis present

## 2022-04-09 DIAGNOSIS — J9621 Acute and chronic respiratory failure with hypoxia: Secondary | ICD-10-CM | POA: Diagnosis not present

## 2022-04-09 DIAGNOSIS — Z809 Family history of malignant neoplasm, unspecified: Secondary | ICD-10-CM

## 2022-04-09 DIAGNOSIS — E1165 Type 2 diabetes mellitus with hyperglycemia: Secondary | ICD-10-CM | POA: Diagnosis present

## 2022-04-09 DIAGNOSIS — I509 Heart failure, unspecified: Secondary | ICD-10-CM

## 2022-04-09 DIAGNOSIS — I2510 Atherosclerotic heart disease of native coronary artery without angina pectoris: Secondary | ICD-10-CM | POA: Diagnosis present

## 2022-04-09 DIAGNOSIS — J441 Chronic obstructive pulmonary disease with (acute) exacerbation: Principal | ICD-10-CM

## 2022-04-09 DIAGNOSIS — I252 Old myocardial infarction: Secondary | ICD-10-CM

## 2022-04-09 DIAGNOSIS — R0602 Shortness of breath: Secondary | ICD-10-CM | POA: Diagnosis present

## 2022-04-09 DIAGNOSIS — I5022 Chronic systolic (congestive) heart failure: Secondary | ICD-10-CM | POA: Diagnosis present

## 2022-04-09 DIAGNOSIS — Z833 Family history of diabetes mellitus: Secondary | ICD-10-CM

## 2022-04-09 DIAGNOSIS — Z951 Presence of aortocoronary bypass graft: Secondary | ICD-10-CM

## 2022-04-09 DIAGNOSIS — C3490 Malignant neoplasm of unspecified part of unspecified bronchus or lung: Secondary | ICD-10-CM | POA: Diagnosis not present

## 2022-04-09 DIAGNOSIS — Z79899 Other long term (current) drug therapy: Secondary | ICD-10-CM | POA: Diagnosis not present

## 2022-04-09 DIAGNOSIS — E872 Acidosis, unspecified: Secondary | ICD-10-CM | POA: Diagnosis not present

## 2022-04-09 DIAGNOSIS — Z87891 Personal history of nicotine dependence: Secondary | ICD-10-CM

## 2022-04-09 DIAGNOSIS — J90 Pleural effusion, not elsewhere classified: Secondary | ICD-10-CM | POA: Diagnosis present

## 2022-04-09 DIAGNOSIS — I9751 Accidental puncture and laceration of a circulatory system organ or structure during a circulatory system procedure: Secondary | ICD-10-CM | POA: Diagnosis not present

## 2022-04-09 DIAGNOSIS — E785 Hyperlipidemia, unspecified: Secondary | ICD-10-CM | POA: Diagnosis present

## 2022-04-09 DIAGNOSIS — I130 Hypertensive heart and chronic kidney disease with heart failure and stage 1 through stage 4 chronic kidney disease, or unspecified chronic kidney disease: Secondary | ICD-10-CM | POA: Diagnosis present

## 2022-04-09 DIAGNOSIS — Z9981 Dependence on supplemental oxygen: Secondary | ICD-10-CM

## 2022-04-09 DIAGNOSIS — I4891 Unspecified atrial fibrillation: Secondary | ICD-10-CM | POA: Diagnosis not present

## 2022-04-09 DIAGNOSIS — E119 Type 2 diabetes mellitus without complications: Secondary | ICD-10-CM | POA: Diagnosis present

## 2022-04-09 DIAGNOSIS — Z923 Personal history of irradiation: Secondary | ICD-10-CM

## 2022-04-09 DIAGNOSIS — I255 Ischemic cardiomyopathy: Secondary | ICD-10-CM | POA: Diagnosis present

## 2022-04-09 DIAGNOSIS — Z9221 Personal history of antineoplastic chemotherapy: Secondary | ICD-10-CM

## 2022-04-09 HISTORY — DX: Presence of cardiac pacemaker: Z95.0

## 2022-04-09 HISTORY — DX: Presence of automatic (implantable) cardiac defibrillator: Z95.810

## 2022-04-09 LAB — BASIC METABOLIC PANEL
Anion gap: 11 (ref 5–15)
BUN: 21 mg/dL (ref 8–23)
CO2: 21 mmol/L — ABNORMAL LOW (ref 22–32)
Calcium: 9.5 mg/dL (ref 8.9–10.3)
Chloride: 106 mmol/L (ref 98–111)
Creatinine, Ser: 1.2 mg/dL — ABNORMAL HIGH (ref 0.44–1.00)
GFR, Estimated: 52 mL/min — ABNORMAL LOW (ref >60)
Glucose, Bld: 278 mg/dL — ABNORMAL HIGH (ref 70–99)
Potassium: 3.4 mmol/L — ABNORMAL LOW (ref 3.5–5.1)
Sodium: 138 mmol/L (ref 135–145)

## 2022-04-09 LAB — GLUCOSE, CAPILLARY
Glucose-Capillary: 294 mg/dL — ABNORMAL HIGH (ref 70–99)
Glucose-Capillary: 271 mg/dL — ABNORMAL HIGH (ref 70–99)
Glucose-Capillary: 146 mg/dL — ABNORMAL HIGH (ref 70–99)

## 2022-04-09 LAB — RESP PANEL BY RT-PCR (FLU A&B, COVID) ARPGX2
Influenza A by PCR: NEGATIVE
Influenza B by PCR: NEGATIVE
SARS Coronavirus 2 by RT PCR: NEGATIVE

## 2022-04-09 LAB — URINALYSIS, ROUTINE W REFLEX MICROSCOPIC
Bilirubin Urine: NEGATIVE
Glucose, UA: >=500 mg/dL — AB
Hgb urine dipstick: NEGATIVE
Ketones, ur: NEGATIVE mg/dL
Nitrite: NEGATIVE
Protein, ur: NEGATIVE mg/dL
Specific Gravity, Urine: 1.01 (ref 1.005–1.030)
pH: 7 (ref 5.0–8.0)

## 2022-04-09 LAB — LACTIC ACID, PLASMA: Lactic Acid, Venous: 6.2 mmol/L (ref 0.5–1.9)

## 2022-04-09 LAB — CBC WITH DIFFERENTIAL/PLATELET
Abs Immature Granulocytes: 0.05 10*3/uL (ref 0.00–0.07)
Basophils Absolute: 0.1 10*3/uL (ref 0.0–0.1)
Basophils Relative: 1 %
Eosinophils Absolute: 0.2 10*3/uL (ref 0.0–0.5)
Eosinophils Relative: 1 %
HCT: 48.4 % — ABNORMAL HIGH (ref 36.0–46.0)
Hemoglobin: 15.5 g/dL — ABNORMAL HIGH (ref 12.0–15.0)
Immature Granulocytes: 0 %
Lymphocytes Relative: 12 %
Lymphs Abs: 1.6 10*3/uL (ref 0.7–4.0)
MCH: 31.6 pg (ref 26.0–34.0)
MCHC: 32 g/dL (ref 30.0–36.0)
MCV: 98.8 fL (ref 80.0–100.0)
Monocytes Absolute: 1.1 10*3/uL — ABNORMAL HIGH (ref 0.1–1.0)
Monocytes Relative: 8 %
Neutro Abs: 9.8 10*3/uL — ABNORMAL HIGH (ref 1.7–7.7)
Neutrophils Relative %: 78 %
Platelets: 173 10*3/uL (ref 150–400)
RBC: 4.9 MIL/uL (ref 3.87–5.11)
RDW: 19.9 % — ABNORMAL HIGH (ref 11.5–15.5)
WBC: 12.8 10*3/uL — ABNORMAL HIGH (ref 4.0–10.5)
nRBC: 0 % (ref 0.0–0.2)

## 2022-04-09 LAB — HEMOGLOBIN A1C
Hgb A1c MFr Bld: 5.9 % — ABNORMAL HIGH (ref 4.8–5.6)
Mean Plasma Glucose: 122.63 mg/dL

## 2022-04-09 LAB — HIV ANTIBODY (ROUTINE TESTING W REFLEX): HIV Screen 4th Generation wRfx: NONREACTIVE

## 2022-04-09 LAB — PROCALCITONIN: Procalcitonin: <0.1 ng/mL

## 2022-04-09 LAB — TROPONIN I (HIGH SENSITIVITY)
Troponin I (High Sensitivity): 35 ng/L — ABNORMAL HIGH (ref <18)
Troponin I (High Sensitivity): 55 ng/L — ABNORMAL HIGH (ref <18)

## 2022-04-09 LAB — D-DIMER, QUANTITATIVE: D-Dimer, Quant: 1.28 ug/mL-FEU — ABNORMAL HIGH (ref 0.00–0.50)

## 2022-04-09 LAB — BRAIN NATRIURETIC PEPTIDE: B Natriuretic Peptide: 2367.1 pg/mL — ABNORMAL HIGH (ref 0.0–100.0)

## 2022-04-09 MED ORDER — ACETAMINOPHEN 650 MG RE SUPP: 650.0000 mg | Freq: Four times a day (QID) | RECTAL | Status: DC | PRN

## 2022-04-09 MED ORDER — INSULIN ASPART 100 UNIT/ML IJ SOLN
0.0000 [IU] | Freq: Three times a day (TID) | INTRAMUSCULAR | Status: DC
  Administered 2022-04-09 (×2): 5 [IU] via SUBCUTANEOUS
  Administered 2022-04-10: 2 [IU] via SUBCUTANEOUS
  Administered 2022-04-10 (×2): 1 [IU] via SUBCUTANEOUS
  Administered 2022-04-11: 5 [IU] via SUBCUTANEOUS
  Administered 2022-04-12: 2 [IU] via SUBCUTANEOUS
  Administered 2022-04-12: 3 [IU] via SUBCUTANEOUS
  Administered 2022-04-13 – 2022-04-15 (×4): 2 [IU] via SUBCUTANEOUS
  Administered 2022-04-15: 3 [IU] via SUBCUTANEOUS

## 2022-04-09 MED ORDER — INSULIN ASPART 100 UNIT/ML IJ SOLN
0.0000 [IU] | Freq: Every day | INTRAMUSCULAR | Status: DC
  Administered 2022-04-12: 2 [IU] via SUBCUTANEOUS

## 2022-04-09 MED ORDER — ALBUTEROL SULFATE (2.5 MG/3ML) 0.083% IN NEBU
INHALATION_SOLUTION | RESPIRATORY_TRACT | Status: AC
Start: 2022-04-09 — End: 2022-04-09
  Administered 2022-04-09: 2.5 mg
  Filled 2022-04-09: qty 3

## 2022-04-09 MED ORDER — SODIUM CHLORIDE 0.9% FLUSH
3.0000 mL | Freq: Two times a day (BID) | INTRAVENOUS | Status: DC
  Administered 2022-04-09 – 2022-04-16 (×12): 3 mL via INTRAVENOUS

## 2022-04-09 MED ORDER — POTASSIUM CHLORIDE CRYS ER 20 MEQ PO TBCR
60.0000 meq | EXTENDED_RELEASE_TABLET | ORAL | Status: AC
  Administered 2022-04-09: 60 meq via ORAL
  Filled 2022-04-09: qty 3

## 2022-04-09 MED ORDER — FUROSEMIDE 10 MG/ML IJ SOLN: 80.0000 mg | Freq: Once | INTRAMUSCULAR | Status: DC

## 2022-04-09 MED ORDER — ATORVASTATIN CALCIUM 80 MG PO TABS
80.0000 mg | ORAL_TABLET | Freq: Every day | ORAL | Status: DC
  Administered 2022-04-09 – 2022-04-15 (×7): 80 mg via ORAL
  Filled 2022-04-09 (×8): qty 1

## 2022-04-09 MED ORDER — ONDANSETRON HCL 4 MG/2ML IJ SOLN
4.0000 mg | Freq: Once | INTRAMUSCULAR | Status: AC
Start: 2022-04-09 — End: 2022-04-09
  Administered 2022-04-09: 4 mg via INTRAVENOUS
  Filled 2022-04-09: qty 2

## 2022-04-09 MED ORDER — DAPAGLIFLOZIN PROPANEDIOL 10 MG PO TABS
10.0000 mg | ORAL_TABLET | Freq: Every day | ORAL | Status: DC
  Administered 2022-04-09 – 2022-04-16 (×7): 10 mg via ORAL
  Filled 2022-04-09 (×7): qty 1

## 2022-04-09 MED ORDER — IVABRADINE HCL 7.5 MG PO TABS
7.5000 mg | ORAL_TABLET | Freq: Two times a day (BID) | ORAL | Status: DC
  Administered 2022-04-09 – 2022-04-16 (×13): 7.5 mg via ORAL
  Filled 2022-04-09 (×16): qty 1

## 2022-04-09 MED ORDER — APIXABAN 5 MG PO TABS
5.0000 mg | ORAL_TABLET | Freq: Two times a day (BID) | ORAL | Status: DC
  Administered 2022-04-09 – 2022-04-11 (×5): 5 mg via ORAL
  Filled 2022-04-09 (×5): qty 1

## 2022-04-09 MED ORDER — PREDNISONE 20 MG PO TABS
40.0000 mg | ORAL_TABLET | Freq: Every day | ORAL | Status: DC
  Administered 2022-04-10 – 2022-04-16 (×6): 40 mg via ORAL
  Filled 2022-04-09 (×6): qty 2

## 2022-04-09 MED ORDER — ACETAMINOPHEN 325 MG PO TABS: 650.0000 mg | ORAL_TABLET | Freq: Four times a day (QID) | ORAL | Status: DC | PRN

## 2022-04-09 MED ORDER — BENZONATATE 100 MG PO CAPS
200.0000 mg | ORAL_CAPSULE | Freq: Three times a day (TID) | ORAL | Status: DC | PRN
  Administered 2022-04-09 – 2022-04-14 (×4): 200 mg via ORAL
  Filled 2022-04-09 (×5): qty 2

## 2022-04-09 MED ORDER — IPRATROPIUM-ALBUTEROL 0.5-2.5 (3) MG/3ML IN SOLN: 3.0000 mL | Freq: Four times a day (QID) | RESPIRATORY_TRACT | Status: DC

## 2022-04-09 MED ORDER — FUROSEMIDE 10 MG/ML IJ SOLN
80.0000 mg | Freq: Once | INTRAMUSCULAR | Status: AC
  Administered 2022-04-09: 80 mg via INTRAVENOUS
  Filled 2022-04-09: qty 8

## 2022-04-09 MED ORDER — SODIUM CHLORIDE 0.9 % IV SOLN
2.0000 g | INTRAVENOUS | Status: DC
  Administered 2022-04-09: 2 g via INTRAVENOUS
  Filled 2022-04-09 (×3): qty 20

## 2022-04-09 MED ORDER — GUAIFENESIN-DM 100-10 MG/5ML PO SYRP
5.0000 mL | ORAL_SOLUTION | ORAL | Status: DC | PRN
Start: 2022-04-09 — End: 2022-04-16
  Administered 2022-04-09 – 2022-04-10 (×3): 5 mL via ORAL
  Filled 2022-04-09 (×4): qty 5

## 2022-04-09 MED ORDER — ALBUTEROL SULFATE (2.5 MG/3ML) 0.083% IN NEBU
2.5000 mg | INHALATION_SOLUTION | RESPIRATORY_TRACT | Status: DC | PRN
  Administered 2022-04-10: 2.5 mg via RESPIRATORY_TRACT
  Filled 2022-04-09 (×2): qty 3

## 2022-04-09 MED ORDER — MIDODRINE HCL 5 MG PO TABS
5.0000 mg | ORAL_TABLET | ORAL | Status: DC
Start: 2022-04-09 — End: 2022-04-09
  Filled 2022-04-09: qty 1

## 2022-04-09 NOTE — Discharge Instructions (Addendum)
Information on my medicine - ELIQUIS? (apixaban) ? ?This medication education was reviewed with me or my healthcare representative as part of my discharge preparation.  The pharmacist that spoke with me during my hospital stay was:   ? ?Why was Eliquis? prescribed for you? ?Eliquis? was prescribed for you to reduce the risk of a blood clot forming that can cause a stroke if you have a medical condition called atrial fibrillation (a type of irregular heartbeat). ? ?What do You need to know about Eliquis? ? ?Take your Eliquis? TWICE DAILY - one tablet in the morning and one tablet in the evening with or without food. If you have difficulty swallowing the tablet whole please discuss with your pharmacist how to take the medication safely. ? ?Take Eliquis? exactly as prescribed by your doctor and DO NOT stop taking Eliquis? without talking to the doctor who prescribed the medication.  Stopping may increase your risk of developing a stroke.  Refill your prescription before you run out. ? ?After discharge, you should have regular check-up appointments with your healthcare provider that is prescribing your Eliquis?.  In the future your dose may need to be changed if your kidney function or weight changes by a significant amount or as you get older. ? ?What do you do if you miss a dose? ?If you miss a dose, take it as soon as you remember on the same day and resume taking twice daily.  Do not take more than one dose of ELIQUIS at the same time to make up a missed dose. ? ?Important Safety Information ?A possible side effect of Eliquis? is bleeding. You should call your healthcare provider right away if you experience any of the following: ?Bleeding from an injury or your nose that does not stop. ?Unusual colored urine (red or dark brown) or unusual colored stools (red or black). ?Unusual bruising for unknown reasons. ?A serious fall or if you hit your head (even if there is no bleeding). ? ?Some medicines may interact with  Eliquis? and might increase your risk of bleeding or clotting while on Eliquis?[place] Kitchen To help avoid this, consult your healthcare provider or pharmacist prior to using any new prescription or non-prescription medications, including herbals, vitamins, non-steroidal anti-inflammatory drugs (NSAIDs) and supplements. ? ?This website has more information on Eliquis? (apixaban): http://www.eliquis.com/eliquis/home ? ?Additional Discharge Instructions ? ? ?Please get your medications reviewed and adjusted by your Primary MD. ? ?Please request your Primary MD to go over all Hospital Tests and Procedure/Radiological results at the follow up, please get all Hospital records sent to your Prim MD by signing hospital release before you go home. ? ?If you had Pneumonia of Lung problems at the Hospital: ?Please get a 2 view Chest X ray done in approximately 4 weeks after hospital discharge or sooner if instructed by your Primary MD. ? ?If you have Congestive Heart Failure: ?Please call your Cardiologist or Primary MD anytime you have any of the following symptoms:  ?1) 3 pound weight gain in 24 hours or 5 pounds in 1 week  ?2) shortness of breath, with or without a dry hacking cough  ?3) swelling in the hands, feet or stomach  ?4) if you have to sleep on extra pillows at night in order to breathe ? ?Follow cardiac low salt diet and 1.5 lit/day fluid restriction. ? ?If you have diabetes ?Accuchecks 4 times/day, Once in AM empty stomach and then before each meal. ?Log in all results and show them to your primary doctor  at your next visit. ?If any glucose reading is under 80 or above 300 call your primary MD immediately. ? ?If you have Seizure/Convulsions/Epilepsy: ?Please do not drive, operate heavy machinery, participate in activities at heights or participate in high speed sports until you have seen by Primary MD or a Neurologist and advised to do so again. ?Per [facility] statutes, patients with seizures are not allowed to  drive until they have been seizure-free for six months.  ?Use caution when using heavy equipment or power tools. Avoid working on ladders or at heights. Take showers instead of baths. Ensure the water temperature is not too high on the home water heater. Do not go swimming alone. Do not lock yourself in a room alone (i.e. bathroom). When caring for infants or small children, sit down when holding, feeding, or changing them to minimize risk of injury to the child in the event you have a seizure. Maintain good sleep hygiene. Avoid alcohol.  ? ?If you had Gastrointestinal Bleeding: ?Please ask your Primary MD to check a complete blood count within one week of discharge or at your next visit. Your endoscopic/colonoscopic biopsies that are pending at the time of discharge, will also need to followed by your Primary MD. ? ?Get Medicines reviewed and adjusted. ?Please take all your medications with you for your next visit with your Primary MD ? ?Please request your Primary MD to go over all hospital tests and procedure/radiological results at the follow up, please ask your Primary MD to get all Hospital records sent to his/her office. ? ?If you experience worsening of your admission symptoms, develop shortness of breath, life threatening emergency, suicidal or homicidal thoughts you must seek medical attention immediately by calling 911 or calling your MD immediately  if symptoms less severe. ? ?You must read complete instructions/literature along with all the possible adverse reactions/side effects for all the Medicines you take and that have been prescribed to you. Take any new Medicines after you have completely understood and accpet all the possible adverse reactions/side effects.  ? ?Do not drive or operate heavy machinery when taking Pain medications.  ? ?Do not take more than prescribed Pain, Sleep and Anxiety Medications ? ?Special Instructions: If you have smoked or chewed Tobacco  in the last 2 yrs please stop  smoking, stop any regular Alcohol  and or any Recreational drug use. ? ?Wear Seat belts while driving. ? ?Please note ?You were cared for by a hospitalist during your hospital stay. If you have any questions about your discharge medications or the care you received while you were in the hospital after you are discharged, you can call the unit and asked to speak with the hospitalist on call if the hospitalist that took care of you is not available. Once you are discharged, your primary care physician will handle any further medical issues. Please note that NO REFILLS for any discharge medications will be authorized once you are discharged, as it is imperative that you return to your primary care physician (or establish a relationship with a primary care physician if you do not have one) for your aftercare needs so that they can reassess your need for medications and monitor your lab values. ? ?You can reach the hospitalist office at phone [phone] or fax [phone] ?  ?If you do not have a primary care physician, you can call [phone] for a physician referral. ? ? ?

## 2022-04-09 NOTE — ED Provider Notes (Signed)
?[facility] ?Provider Note ? ? ?CSN: [id number] ?Arrival date & time: 04/09/22  0704 ? ?  ? ?History ? ?Chief Complaint  ?Patient presents with  ? Respiratory Distress  ? ? ?Brittany [name] is a 61 y.o. female. ? ?Pt is a 61 yo female with a pmhx significant for CAD s/p CABG, COPD, CKD3, DM2, hyperlipidemia, afib on Eliquis, htn, small cell lung cancer, chf (last echo 12/30/21 EF 20-25%).  Pt was seen in the ED on 4/11.  She was given nebs and diuretics and was feeling better.  Pt opted for d/c and so was sent home.  Pt said she has been having sob again for the past few days.  She is extremely sob now and is unable to give much hx.  Pt called EMS this am because she could not breathe.  EMS found her in significant distress in a tripod position.  Pt given 10 mg albuterol, 5 mg atrovent, 125 mg solumedrol, and 2 g of Mg en route.  She was also put on CPAP.   ? ?  ? ? ?  ? ?Home Medications ?Prior to Admission medications   ?Medication Sig Start Date End Date Taking? Authorizing Provider  ?albuterol (PROVENTIL) (5 MG/ML) 0.5% nebulizer solution Take 0.5 mLs (2.5 mg total) by nebulization every 6 (six) hours as needed for wheezing or shortness of breath. 03/21/22   Loni Beckwith, PA-C  ?albuterol (VENTOLIN HFA) 108 (90 Base) MCG/ACT inhaler Inhale 2 puffs into the lungs every 6 (six) hours as needed for wheezing or shortness of breath. 03/21/22   Loni Beckwith, PA-C  ?apixaban (ELIQUIS) 5 MG TABS tablet Take 1 tablet (5 mg total) by mouth 2 (two) times daily. 03/20/22   Bensimhon, Shaune Pascal, MD  ?atorvastatin (LIPITOR) 80 MG tablet Take 1 tablet (80 mg total) by mouth daily at 6 PM. 07/03/18   Bensimhon, Shaune Pascal, MD  ?blood glucose meter kit and supplies KIT Dispense based on patient and insurance preference. Use up to four times daily as directed. 04/11/21   Bonnielee Haff, MD  ?CORLANOR 7.5 MG TABS tablet TAKE 1 TABLET (7.5 MG TOTAL) BY MOUTH 2 (TWO) TIMES DAILY  WITH A MEAL. 10/10/21   Bensimhon, Shaune Pascal, MD  ?FARXIGA 10 MG TABS tablet TAKE 1 TABLET (10 MG TOTAL) BY MOUTH DAILY BEFORE BREAKFAST. 08/16/21   Bensimhon, Shaune Pascal, MD  ?Glucerna (GLUCERNA) LIQD Take 237 mLs by mouth daily in the afternoon.    [provider]  ?ipratropium-albuterol (DUONEB) 0.5-2.5 (3) MG/3ML SOLN Take 3 mLs by nebulization 2 (two) times daily as needed (for shortness of breath).    [provider]  ?metolazone (ZAROXOLYN) 2.5 MG tablet Take 2.5 mg by mouth as needed.    [provider]  ?Multiple Vitamin (MULTIVITAMIN WITH MINERALS) TABS tablet Take 1 tablet by mouth daily. 12/15/21   Hongalgi, Lenis Dickinson, MD  ?nitroGLYCERIN (NITROSTAT) 0.4 MG SL tablet Place 1 tablet (0.4 mg total) under the tongue every 5 (five) minutes as needed for chest pain. 02/14/19   Shirley Friar, PA-C  ?potassium chloride SA (KLOR-CON M) 20 MEQ tablet Take 20 mEq by mouth as needed.    [provider]  ?spironolactone (ALDACTONE) 25 MG tablet Take 0.5 tablets (12.5 mg total) by mouth daily. 03/01/21   Clegg, Amy D, NP  ?torsemide (DEMADEX) 20 MG tablet Take 2 tablets (40 mg total) by mouth daily. 12/15/21   Hongalgi, Lenis Dickinson, MD  ?   ? ?  Allergies    ?Codeine   ? ?Review of Systems   ?Review of Systems  ?Unable to perform ROS: Severe respiratory distress  ?All other systems reviewed and are negative. ? ?Physical Exam ?Updated Vital Signs ?BP (!) 106/50   Pulse 93   Temp 97.6 ?F (36.4 ?C) (Temporal)   Resp 18   Ht 4' 11"  (1.499 m)   Wt 46 kg   SpO2 100%   BMI 20.48 kg/m?  ?Physical Exam ?Vitals and nursing note reviewed.  ?Constitutional:   ?   General: She is in acute distress.  ?   Appearance: She is ill-appearing.  ?HENT:  ?   Head: Normocephalic and atraumatic.  ?   Right Ear: External ear normal.  ?   Left Ear: External ear normal.  ?   Nose: Nose normal.  ?   Mouth/Throat:  ?   Mouth: Mucous membranes are dry.  ?Eyes:  ?   Extraocular Movements: Extraocular movements  intact.  ?   Pupils: Pupils are equal, round, and reactive to light.  ?Cardiovascular:  ?   Rate and Rhythm: Regular rhythm. Tachycardia present.  ?   Pulses: Normal pulses.  ?   Heart sounds: Normal heart sounds.  ?Pulmonary:  ?   Effort: Respiratory distress present.  ?   Breath sounds: Wheezing present.  ?Abdominal:  ?   General: Abdomen is flat. Bowel sounds are normal.  ?   Palpations: Abdomen is soft.  ?Musculoskeletal:     ?   General: Normal range of motion.  ?   Cervical back: Normal range of motion and neck supple.  ?Skin: ?   General: Skin is warm.  ?   Capillary Refill: Capillary refill takes less than 2 seconds.  ?Neurological:  ?   General: No focal deficit present.  ?   Mental Status: She is alert and oriented to person, place, and time.  ?Psychiatric:     ?   Mood and Affect: Mood normal.     ?   Behavior: Behavior normal.     ?   Thought Content: Thought content normal.     ?   Judgment: Judgment normal.  ? ? ?ED Results / Procedures / Treatments   ?Labs ?(all labs ordered are listed, but only abnormal results are displayed) ?Labs Reviewed  ?BASIC METABOLIC PANEL - Abnormal; Notable for the following components:  ?    Result Value  ? Potassium 3.4 (*)   ? CO2 21 (*)   ? Glucose, Bld 278 (*)   ? Creatinine, Ser 1.20 (*)   ? GFR, Estimated 52 (*)   ? All other components within normal limits  ?CBC WITH DIFFERENTIAL/PLATELET - Abnormal; Notable for the following components:  ? WBC 12.8 (*)   ? Hemoglobin 15.5 (*)   ? HCT 48.4 (*)   ? RDW 19.9 (*)   ? Neutro Abs 9.8 (*)   ? Monocytes Absolute 1.1 (*)   ? All other components within normal limits  ?BRAIN NATRIURETIC PEPTIDE - Abnormal; Notable for the following components:  ? B Natriuretic Peptide 2,367.1 (*)   ? All other components within normal limits  ?TROPONIN I (HIGH SENSITIVITY) - Abnormal; Notable for the following components:  ? Troponin I (High Sensitivity) 35 (*)   ? All other components within normal limits  ?RESP PANEL BY RT-PCR (FLU A&B,  COVID) [id number]  ?TROPONIN I (HIGH SENSITIVITY)  ? ? ?EKG ?EKG Interpretation ? ?Date/Time:  Sunday April 09 2022 07:30:14 EDT ?Ventricular  Rate:  99 ?PR Interval:  168 ?QRS Duration: 111 ?QT Interval:  404 ?QTC Calculation: 519 ?R Axis:   133 ?Text Interpretation: Sinus rhythm Biatrial enlargement Right axis deviation Consider left ventricular hypertrophy Prolonged QT interval No significant change since last tracing Confirmed by Isla Pence [phone]) on 04/09/2022 7:36:49 AM ? ?Radiology ?DG Chest Port 1 View ? ?Result Date: 04/09/2022 ?CLINICAL DATA:  61 year old female with shortness of breath, respiratory distress. EXAM: PORTABLE CHEST 1 VIEW COMPARISON:  Chest radiographs 03/21/2022 and earlier. FINDINGS: Portable AP upright view at 0719 hours. Stable cardiomegaly and mediastinal contours. Stable left chest AICD. Prior CABG. Lung volumes and ventilation appear stable from earlier this month, chronic small right pleural effusion and/or pleural adhesion appears stable from last year. Incidental left nipple shadow. Visualized tracheal air column is within normal limits. No acute osseous abnormality identified. Negative visible bowel gas. IMPRESSION: Stable cardiomegaly and small chronic right pleural effusion and/or pleural adhesion. No acute cardiopulmonary abnormality. Electronically Signed   By: Genevie Ann M.D.   On: 04/09/2022 08:15   ? ?Procedures ?Procedures  ? ? ?Medications Ordered in ED ?Medications  ?ondansetron (ZOFRAN) injection 4 mg (4 mg Intravenous Given 04/09/22 0717)  ?albuterol (PROVENTIL) (2.5 MG/3ML) 0.083% nebulizer solution (2.5 mg  Given 04/09/22 0718)  ? ? ?ED Course/ Medical Decision Making/ A&P ?  ?                        ?Medical Decision Making ?Amount and/or Complexity of Data Reviewed ?Labs: ordered. ?Radiology: ordered. ? ?Risk ?Prescription drug management. ?Decision regarding hospitalization. ? ? ?This patient presents to the ED for concern of sob, this involves an extensive number of  treatment options, and is a complaint that carries with it a high risk of complications and morbidity.  The differential diagnosis includes copd exac, chf exac, covid/flu, uri, pna ? ? ?Co morbidities that complicate

## 2022-04-09 NOTE — Consult Note (Signed)
?Cardiology Consultation:  ? ?Patient ID: Brittany [name] ?MRN: [id number]; DOB: 12-22-1960 ? ?Admit date: 04/09/2022 ?Date of Consult: 04/09/2022 ? ?PCP:  Scifres, Dorothy, PA-C ?  ?[facility] Providers ?Cardiologist:  None  ?Electrophysiologist:  Thompson Grayer, MD  ?Advanced Heart Failure:  Glori Bickers, MD  { ?  ? ? ?Patient Profile:  ? ?Brittany [name] is a 61 y.o. female with a hx of CAD with prior CABG and PCI, chronic systolic HF, PAF who is being seen 04/09/2022 for the evaluation of SOB at the request of Dr Tamala Julian. ? ?History of Present Illness:  ? 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 Respiratory distress at time of EMS evaluation, given nebs and IV steroids, placed on cpap then weaned ton 4L [place] ?Reports 3 days of progression SOB, cough. No LE edema but reports some abdominal distension. REports compliant with meds. Has not been checking home weights.  ? ? ? ? ?03/24/22 46 kg (101 lb 6.4 oz)  ?12/22/21 46.3 kg (102 lb)  ?12/13/21 44.6 kg (98 lb 4.8 oz)  ? ?ER vitals: 129/106 p 104 100% RA ? ? ?K 3.4 Cr 1.20 BUN 21 WBC 12.8 Hgb 15.5 Plt 173 BNP 2367(650 2 weeks ago) ?Trop 35-->55 ?COVID neg flu neg ?CXR no acute process. Small chronic right effusion ?EKG SR, RAD, BAE, no acute ischemic changes ? ?ECHO 2022: EF < 20% RV normal  ?ECHO 2021: EF 20-25% RV mildly reduced.  ?ECHO 02/12/19: EF 25-30%, RV mildly reduced ?ECHO 02/10/21: EF <20%, G2DD ? ? ?Past Medical History:  ?Diagnosis Date  ? Acute respiratory failure ([place]) 05/05/2018  ? Acute systolic congestive heart failure ([place]) 02/03/2018  ? Allergy   ? Anxiety   ? Asthma   ? DM2 (diabetes mellitus, type 2) ([place]) 10/20/2020  ? Hypertension   ? PAF (paroxysmal atrial  fibrillation) ([place])   ? Prophylactic measure 08/03/14-08/19/14  ? Prophyl. cranial radiation 24 Gy  ? S/P emergency CABG x 3 02/03/2018  ? LIMA to LAD, SVG to D1, SVG to OM1, EVH via right thigh with implantation of Impella LD LVAD via direct aortic approach  ? Small cell lung cancer ([place]) 03/16/2014  ? ? ?Past Surgical History:  ?Procedure Laterality Date  ? BRONCHIAL BRUSHINGS  10/25/2020  ? Procedure: BRONCHIAL BRUSHINGS;  Surgeon: Collene Gobble, MD;  Location[facility] END[facility]COPY;  Service: Pulmonary;;  ? BRONCHIAL NEEDLE ASPIRATION BIOPSY  10/25/2020  ? Procedure: BRONCHIAL NEEDLE ASPIRATION BIOPSIES;  Surgeon: Collene Gobble, MD;  Location: [facility] ENDOSCOPY;  Service: Pulmonary;;  ? [place]  08/15/2018  ? MDT Visia AF MRI VR ICD implanted by Dr Loel Lofty for primary prevention of sudden  ? CESAREAN SECTION    ? CORONARY ARTERY BYPASS GRAFT N/A 02/03/2018  ? Procedure: CORONARY ARTERY BYPASS GRAFTING (CABG);  Surgeon: Rexene Alberts, MD;  Location: [facility];  Service: Open Heart Surgery;  Laterality: N/A;  Time 3 using left internal mammary artery and endoscopically harvested right saphenous vein  ? CORONARY BALLOON ANGIOPLASTY N/A 02/03/2018  ? Procedure: CORONARY BALLOON ANGIOPLASTY;  Surgeon: Martinique, Peter M, MD;  Location: [facility] [place] CV LAB;  Service: Cardiovascular;  Laterality: N/A;  ? CORONARY STENT INTERVENTION N/A 02/12/2019  ? Procedure: CORONARY STENT INTERVENTION;  Surgeon: Kathlyn Sacramento  A, MD;  Location: [facility] CV LAB;  Service: Cardiovascular;  Laterality: N/A;  ? CORONARY/GRAFT ACUTE MI REVASCULARIZATION N/A 02/03/2018  ? Procedure: Coronary/Graft Acute MI Revascularization;  Surgeon: Martinique, Peter M, MD;  Location: [facility] CV LAB;  Service: Cardiovascular;  Laterality: N/A;  ? ENDOBRONCHIAL ULTRASOUND N/A 10/25/2020  ? Procedure: ENDOBRONCHIAL ULTRASOUND;  Surgeon: Collene Gobble, MD;  Location: [facility] ENDOSCOPY;  Service: Pulmonary;  Laterality: N/A;  ? FLEXIBLE BRONCHOSCOPY   10/25/2020  ? Procedure: FLEXIBLE BRONCHOSCOPY;  Surgeon: Collene Gobble, MD;  Location: [facility] ENDOSCOPY;  Service: Pulmonary;;  ? IABP INSERTION N/A 02/03/2018  ? Procedure: IABP Insertion;  Surgeon: Martinique, Peter M, MD;  Location: [facility] CV LAB;  Service: Cardiovascular;  Laterality: N/A;  ? INTRAOPERATIVE TRANSESOPHAGEAL ECHOCARDIOGRAM N/A 02/03/2018  ? Procedure: INTRAOPERATIVE TRANSESOPHAGEAL ECHOCARDIOGRAM;  Surgeon: Rexene Alberts, MD;  Location: [facility];  Service: Open Heart Surgery;  Laterality: N/A;  ? LEFT HEART CATH AND CORONARY ANGIOGRAPHY N/A 02/03/2018  ? Procedure: LEFT HEART CATH AND CORONARY ANGIOGRAPHY;  Surgeon: Martinique, Peter M, MD;  Location: [place] CV LAB;  Service: Cardiovascular;  Laterality: N/A;  ? LEFT HEART CATH AND CORS/GRAFTS ANGIOGRAPHY N/A 02/12/2019  ? Procedure: LEFT HEART CATH AND CORS/GRAFTS ANGIOGRAPHY;  Surgeon: Wellington Hampshire, MD;  Location: [facility] CV LAB;  Service: Cardiovascular;  Laterality: N/A;  ? MEDIASTINOSCOPY N/A 03/11/2014  ? Procedure: MEDIASTINOSCOPY;  Surgeon: Melrose Nakayama, MD;  Location: [facility];  Service: Thoracic;  Laterality: N/A;  ? PLACEMENT OF IMPELLA LEFT VENTRICULAR ASSIST DEVICE  02/03/2018  ? Procedure: PLACEMENT OF [place] LEFT VENTRICULAR ASSIST DEVICE LD;  Surgeon: Rexene Alberts, MD;  Location: [facility];  Service: Open Heart Surgery;;  ? REMOVAL OF IMPELLA LEFT VENTRICULAR ASSIST DEVICE N/A 02/08/2018  ? Procedure: REMOVAL OF [place] LEFT VENTRICULAR ASSIST DEVICE;  Surgeon: Rexene Alberts, MD;  Location: [facility];  Service: Open Heart Surgery;  Laterality: N/A;  ? RIGHT HEART CATH N/A 02/03/2018  ? Procedure: RIGHT HEART CATH;  Surgeon: Martinique, Peter M, MD;  Location: [place] CV LAB;  Service: Cardiovascular;  Laterality: N/A;  ? RIGHT HEART CATH N/A 05/09/2018  ? Procedure: RIGHT HEART CATH;  Surgeon: Jolaine Artist, MD;  Location: [facility] CV LAB;  Service: Cardiovascular;  Laterality: N/A;  ? TEE WITHOUT CARDIOVERSION N/A  02/08/2018  ? Procedure: TRANSESOPHAGEAL ECHOCARDIOGRAM (TEE);  Surgeon: Rexene Alberts, MD;  Location: [facility];  Service: Open Heart Surgery;  Laterality: N/A;  ? TUBAL LIGATION    ? VIDEO BRONCHOSCOPY WITH ENDOBRONCHIAL ULTRASOUND N/A 03/11/2014  ? Procedure: VIDEO BRONCHOSCOPY WITH ENDOBRONCHIAL ULTRASOUND;  Surgeon: Melrose Nakayama, MD;  Location: [facility];  Service: Thoracic;  Laterality: N/A;  ?  ? ? ?Inpatient Medications: ?Scheduled Meds: ? apixaban  5 mg Oral BID  ? atorvastatin  80 mg Oral q1800  ? insulin aspart  0-5 Units Subcutaneous QHS  ? insulin aspart  0-9 Units Subcutaneous TID WC  ? ipratropium-albuterol  3 mL Nebulization QID  ? midodrine  5 mg Oral STAT  ? potassium chloride  60 mEq Oral STAT  ? [START ON 04/10/2022] predniSONE  40 mg Oral Q breakfast  ? sodium chloride flush  3 mL Intravenous Q12H  ? ?Continuous Infusions: ? ?PRN Meds: ?acetaminophen **OR** acetaminophen, albuterol ? ?Allergies:    ?Allergies  ?Allergen Reactions  ? Codeine Nausea And Vomiting  ? ? ?Social History:   ?Social History  ? ?Socioeconomic History  ? Marital status:  Married  ?  Spouse name: Michell Heinrich   ? Number of children: 1  ? Years of education: Not on file  ? Highest education level: Not on file  ?Occupational History  ? Not on file  ?Tobacco Use  ? Smoking status: Former  ?  Packs/day: 1.00  ?  Years: 20.00  ?  Pack years: 20.00  ?  Types: Cigarettes  ?  Quit date: 03/13/2014  ?  Years since quitting: 8.0  ? Smokeless tobacco: Never  ?Vaping Use  ? Vaping Use: Never used  ?Substance and Sexual Activity  ? Alcohol use: Yes  ?  Alcohol/week: 8.0 standard drinks  ?  Types: 8 Glasses of wine per week  ? Drug use: Yes  ?  Types: Marijuana  ?  Comment: "medicinal"  ? Sexual activity: Never  ?Other Topics Concern  ? Not on file  ?Social History Narrative  ? Not on file  ? ?Social Determinants of Health  ? ?Financial Resource Strain: Not on file  ?Food Insecurity: Not on file  ?Transportation Needs: Unmet  Transportation Needs  ? Lack of Transportation (Medical): Yes  ? Lack of Transportation (Non-Medical): No  ?Physical Activity: Not on file  ?Stress: Not on file  ?Social Connections: Not on file  ?Intimate Partner Violence: Not

## 2022-04-09 NOTE — ED Notes (Signed)
ED Provider and respiratory at bedside. ?

## 2022-04-09 NOTE — H&P (Addendum)
?History and Physical  ? ? ?Patient: Brittany [name], Brittany [name] ?DOA: 04/09/2022 ?DOS: the patient was seen and examined on 04/09/2022 ?PCP: Scifres, Dorothy, PA-C  ?Patient coming from:via EMS ? ?Chief Complaint:  ?Chief Complaint  ?Patient presents with  ? Respiratory Distress  ? ?HPI: Brittany [name] is a 61 y.o. female with medical history significant of hypertension, hyperlipidemia, CAD  s/p CABG, combined systolic and diastolic CHF last EF 37-90% with grade 2 diastolic distal, paroxysmal atrial fibrillation, anxiety, and small cell lung cancer presents with complaints of progressively worsening shortness of breath over the last couple of days.  States having difficulty sleeping at night due to feeling as though she were suffocating.  Unable to get any rest last night for which she called EMS.  Normally she has oxygen available as needed, but was not staying at her home and did not feel it would have been helpful eventually had access to it. ? ?Records note patient had been seen in the emergency department on 4/11 for shortness of breath thought to be secondary to acute congestive heart failure exacerbation.  Treated with breathing treatments and diuretics with reports of feeling better for which she did not want to be admitted into the hospital. ? ?Patient was found tripoding on EMS arrival.  Given DuoNeb breathing treatment, Solu-Medrol 125 mg IV, magnesium sulfate 2 g, and placed on CPAP due to respiratory distress distress with EMS. ? ?Upon admission to the emergency department patient was noted to be afebrile, pulse 86-1 04, respirations 16-26, blood pressure 98/43 to 129/106, and O2 saturation currently maintained on 4 L nasal cannula oxygen. Labs significant for WBC 12.8, hemoglobin 15.5, potassium 3.4, BUN 21, creatinine 1.2, glucose 278, BNP 2367.1, and high-sensitivity troponin 35.  Influenza and COVID-19 screening were negative chest x-ray noted stable cardiomegaly  with small chronic right pleural effusion and/or pleural adhesion without any other acute finding.  Patient had initially been given Zofran 4 mg IV in the ED.  TRH called to admit. ? ?Review of Systems: As mentioned in the history of present illness. All other systems reviewed and are negative. ?Past Medical History:  ?Diagnosis Date  ? Acute respiratory failure ([place]) 05/05/2018  ? Acute systolic congestive heart failure ([place]) 02/03/2018  ? Allergy   ? Anxiety   ? Asthma   ? DM2 (diabetes mellitus, type 2) ([place]) 10/20/2020  ? Hypertension   ? PAF (paroxysmal atrial fibrillation) ([place])   ? Prophylactic measure 08/03/14-08/19/14  ? Prophyl. cranial radiation 24 Gy  ? S/P emergency CABG x 3 02/03/2018  ? LIMA to LAD, SVG to D1, SVG to OM1, EVH via right thigh with implantation of Impella LD LVAD via direct aortic approach  ? Small cell lung cancer ([place]) 04/[name]/2015  ? ?Past Surgical History:  ?Procedure Laterality Date  ? BRONCHIAL BRUSHINGS  10/25/2020  ? Procedure: BRONCHIAL BRUSHINGS;  Surgeon: Collene Gobble, MD;  Location: [facility] ENDOSCOPY;  Service: Pulmonary;;  ? BRONCHIAL NEEDLE ASPIRATION BIOPSY  10/25/2020  ? Procedure: BRONCHIAL NEEDLE ASPIRATION BIOPSIES;  Surgeon: Collene Gobble, MD;  Location: [facility] ENDOSCOPY;  Service: Pulmonary;;  ? [place]  08/15/2018  ? MDT Visia AF MRI VR ICD implanted by Dr Loel Lofty for primary prevention of sudden  ? CESAREAN SECTION    ? CORONARY ARTERY BYPASS GRAFT N/A 02/03/2018  ? Procedure: CORONARY ARTERY BYPASS GRAFTING (CABG);  Surgeon: Rexene Alberts, MD;  Location: [place];  Service: Open Heart Surgery;  Laterality: N/A;  Time 3 using  left internal mammary artery and endoscopically harvested right saphenous vein  ? CORONARY BALLOON ANGIOPLASTY N/A 02/03/2018  ? Procedure: CORONARY BALLOON ANGIOPLASTY;  Surgeon: Martinique, Peter M, MD;  Location: [place] CV LAB;  Service: Cardiovascular;  Laterality: N/A;  ? CORONARY STENT INTERVENTION N/A 02/12/2019  ? Procedure:  CORONARY STENT INTERVENTION;  Surgeon: Wellington Hampshire, MD;  Location: [place] CV LAB;  Service: Cardiovascular;  Laterality: N/A;  ? CORONARY/GRAFT ACUTE MI REVASCULARIZATION N/A 02/03/2018  ? Procedure: Coronary/Graft Acute MI Revascularization;  Surgeon: Martinique, Peter M, MD;  Location: [place] CV LAB;  Service: Cardiovascular;  Laterality: N/A;  ? ENDOBRONCHIAL ULTRASOUND N/A 10/25/2020  ? Procedure: ENDOBRONCHIAL ULTRASOUND;  Surgeon: Collene Gobble, MD;  Location[facility] END[facility]COPY;  Service: Pulmonary;  Laterality: N/A;  ? FLEXIBLE BRONCHOSCOPY  10/25/2020  ? Procedure: FLEXIBLE BRONCHOSCOPY;  Surgeon: Collene Gobble, MD;  Location: [facility] ENDOSCOPY;  Service: Pulmonary;;  ? IABP INSERTION N/A 02/03/2018  ? Procedure: IABP Insertion;  Surgeon: Martinique, Peter M, MD;  Location: [place] CV LAB;  Service: Cardiovascular;  Laterality: N/A;  ? INTRAOPERATIVE TRANSESOPHAGEAL ECHOCARDIOGRAM N/A 02/03/2018  ? Procedure: INTRAOPERATIVE TRANSESOPHAGEAL ECHOCARDIOGRAM;  Surgeon: Rexene Alberts, MD;  Location: [place];  Service: Open Heart Surgery;  Laterality: N/A;  ? LEFT HEART CATH AND CORONARY ANGIOGRAPHY N/A 02/03/2018  ? Procedure: LEFT HEART CATH AND CORONARY ANGIOGRAPHY;  Surgeon: Martinique, Peter M, MD;  Location: [place] CV LAB;  Service: Cardiovascular;  Laterality: N/A;  ? LEFT HEART CATH AND CORS/GRAFTS ANGIOGRAPHY N/A 02/12/2019  ? Procedure: LEFT HEART CATH AND CORS/GRAFTS ANGIOGRAPHY;  Surgeon: Wellington Hampshire, MD;  Location: [place] CV LAB;  Service: Cardiovascular;  Laterality: N/A;  ? MEDIASTINOSCOPY N/A 03/11/2014  ? Procedure: MEDIASTINOSCOPY;  Surgeon: Melrose Nakayama, MD;  Location: [place];  Service: Thoracic;  Laterality: N/A;  ? PLACEMENT OF IMPELLA LEFT VENTRICULAR ASSIST DEVICE  02/03/2018  ? Procedure: PLACEMENT OF [place] LEFT VENTRICULAR ASSIST DEVICE LD;  Surgeon: Rexene Alberts, MD;  Location: [place];  Service: Open Heart Surgery;;  ? REMOVAL OF IMPELLA LEFT VENTRICULAR ASSIST  DEVICE N/A 02/08/2018  ? Procedure: REMOVAL OF [place] LEFT VENTRICULAR ASSIST DEVICE;  Surgeon: Rexene Alberts, MD;  Location: [place];  Service: Open Heart Surgery;  Laterality: N/A;  ? RIGHT HEART CATH N/A 02/03/2018  ? Procedure: RIGHT HEART CATH;  Surgeon: Martinique, Peter M, MD;  Location: [place] CV LAB;  Service: Cardiovascular;  Laterality: N/A;  ? RIGHT HEART CATH N/A 05/09/2018  ? Procedure: RIGHT HEART CATH;  Surgeon: Jolaine Artist, MD;  Location: [place] CV LAB;  Service: Cardiovascular;  Laterality: N/A;  ? TEE WITHOUT CARDIOVERSION N/A 02/08/2018  ? Procedure: TRANSESOPHAGEAL ECHOCARDIOGRAM (TEE);  Surgeon: Rexene Alberts, MD;  Location: [place];  Service: Open Heart Surgery;  Laterality: N/A;  ? TUBAL LIGATION    ? VIDEO BRONCHOSCOPY WITH ENDOBRONCHIAL ULTRASOUND N/A 03/11/2014  ? Procedure: VIDEO BRONCHOSCOPY WITH ENDOBRONCHIAL ULTRASOUND;  Surgeon: Melrose Nakayama, MD;  Location: [place];  Service: Thoracic;  Laterality: N/A;  ? ?Social History:  reports that she quit smoking about 8 years ago. Her smoking use included cigarettes. She has a 20.00 pack-year smoking history. She has never used smokeless tobacco. She reports current alcohol use of about 8.0 standard drinks per week. She reports current drug use. Drug: Marijuana. ? ?Allergies  ?Allergen Reactions  ? Codeine Nausea And Vomiting  ? ? ?Family History  ?Problem Relation Age of [place]  ?  Heart disease Mother   ? Hypertension Mother   ? Heart attack Mother   ? Hypertension Maternal Grandmother   ? Cancer Maternal Grandmother   ? Diabetes Paternal Grandmother   ? Stroke Neg Hx   ? ? ?Prior to Admission medications   ?Medication Sig Start Date End Date Taking? Authorizing Provider  ?albuterol (PROVENTIL) (5 MG/ML) 0.5% nebulizer solution Take 0.5 mLs (2.5 mg total) by nebulization every 6 (six) hours as needed for wheezing or shortness of breath. 03/21/22   Loni Beckwith, PA-C  ?albuterol (VENTOLIN HFA) 108 (90 Base) MCG/ACT inhaler  Inhale 2 puffs into the lungs every 6 (six) hours as needed for wheezing or shortness of breath. 03/21/22   Loni Beckwith, PA-C  ?apixaban (ELIQUIS) 5 MG TABS tablet Take 1 tablet (5 mg total) by mout

## 2022-04-09 NOTE — ED Triage Notes (Signed)
Per [facility] pt coming from home hx of asthma. Arrives on CPAP. EMS administered  .5atrovent 10 albuterol 125 solu medrol and 2G mag. Wheezing in all fields and patient in tripod position on arrival.  ?

## 2022-04-10 DIAGNOSIS — J441 Chronic obstructive pulmonary disease with (acute) exacerbation: Secondary | ICD-10-CM | POA: Diagnosis not present

## 2022-04-10 LAB — COMPREHENSIVE METABOLIC PANEL
ALT: 18 U/L (ref 0–44)
AST: 24 U/L (ref 15–41)
Albumin: 3.7 g/dL (ref 3.5–5.0)
Alkaline Phosphatase: 64 U/L (ref 38–126)
Anion gap: 8 (ref 5–15)
BUN: 29 mg/dL — ABNORMAL HIGH (ref 8–23)
CO2: 25 mmol/L (ref 22–32)
Calcium: 9.8 mg/dL (ref 8.9–10.3)
Chloride: 105 mmol/L (ref 98–111)
Creatinine, Ser: 1.17 mg/dL — ABNORMAL HIGH (ref 0.44–1.00)
GFR, Estimated: 53 mL/min — ABNORMAL LOW (ref >60)
Glucose, Bld: 123 mg/dL — ABNORMAL HIGH (ref 70–99)
Potassium: 4.3 mmol/L (ref 3.5–5.1)
Sodium: 138 mmol/L (ref 135–145)
Total Bilirubin: 0.6 mg/dL (ref 0.3–1.2)
Total Protein: 6.9 g/dL (ref 6.5–8.1)

## 2022-04-10 LAB — CBC
HCT: 41.7 % (ref 36.0–46.0)
HCT: 43.3 % (ref 36.0–46.0)
Hemoglobin: 13.3 g/dL (ref 12.0–15.0)
Hemoglobin: 13.7 g/dL (ref 12.0–15.0)
MCH: 30.6 pg (ref 26.0–34.0)
MCH: 30.8 pg (ref 26.0–34.0)
MCHC: 31.6 g/dL (ref 30.0–36.0)
MCHC: 31.9 g/dL (ref 30.0–36.0)
MCV: 96.5 fL (ref 80.0–100.0)
MCV: 96.9 fL (ref 80.0–100.0)
Platelets: 131 10*3/uL — ABNORMAL LOW (ref 150–400)
Platelets: 131 10*3/uL — ABNORMAL LOW (ref 150–400)
RBC: 4.32 MIL/uL (ref 3.87–5.11)
RBC: 4.47 MIL/uL (ref 3.87–5.11)
RDW: 19.4 % — ABNORMAL HIGH (ref 11.5–15.5)
RDW: 19.9 % — ABNORMAL HIGH (ref 11.5–15.5)
WBC: 12 10*3/uL — ABNORMAL HIGH (ref 4.0–10.5)
WBC: 15.9 10*3/uL — ABNORMAL HIGH (ref 4.0–10.5)
nRBC: 0 % (ref 0.0–0.2)
nRBC: 0 % (ref 0.0–0.2)

## 2022-04-10 LAB — MAGNESIUM: Magnesium: 2.2 mg/dL (ref 1.7–2.4)

## 2022-04-10 LAB — BASIC METABOLIC PANEL WITH GFR
Anion gap: 10 (ref 5–15)
BUN: 30 mg/dL — ABNORMAL HIGH (ref 8–23)
CO2: 24 mmol/L (ref 22–32)
Calcium: 9.4 mg/dL (ref 8.9–10.3)
Chloride: 103 mmol/L (ref 98–111)
Creatinine, Ser: 1.17 mg/dL — ABNORMAL HIGH (ref 0.44–1.00)
GFR, Estimated: 53 mL/min — ABNORMAL LOW
Glucose, Bld: 121 mg/dL — ABNORMAL HIGH (ref 70–99)
Potassium: 4.2 mmol/L (ref 3.5–5.1)
Sodium: 137 mmol/L (ref 135–145)

## 2022-04-10 LAB — GLUCOSE, CAPILLARY
Glucose-Capillary: 122 mg/dL — ABNORMAL HIGH (ref 70–99)
Glucose-Capillary: 139 mg/dL — ABNORMAL HIGH (ref 70–99)
Glucose-Capillary: 186 mg/dL — ABNORMAL HIGH (ref 70–99)
Glucose-Capillary: 180 mg/dL — ABNORMAL HIGH (ref 70–99)

## 2022-04-10 LAB — LACTIC ACID, PLASMA: Lactic Acid, Venous: 1.1 mmol/L (ref 0.5–1.9)

## 2022-04-10 MED ORDER — FUROSEMIDE 10 MG/ML IJ SOLN
80.0000 mg | Freq: Two times a day (BID) | INTRAMUSCULAR | Status: DC
  Administered 2022-04-10 – 2022-04-12 (×4): 80 mg via INTRAVENOUS
  Filled 2022-04-10 (×4): qty 8

## 2022-04-10 MED ORDER — DOXYCYCLINE HYCLATE 100 MG PO TABS
100.0000 mg | ORAL_TABLET | Freq: Two times a day (BID) | ORAL | Status: DC
  Administered 2022-04-10 – 2022-04-11 (×3): 100 mg via ORAL
  Filled 2022-04-10 (×3): qty 1

## 2022-04-10 MED ORDER — SPIRONOLACTONE 12.5 MG HALF TABLET
12.5000 mg | ORAL_TABLET | Freq: Every day | ORAL | Status: DC
  Administered 2022-04-10 – 2022-04-16 (×6): 12.5 mg via ORAL
  Filled 2022-04-10 (×6): qty 1

## 2022-04-10 NOTE — Progress Notes (Unsigned)
No ICM remote transmission received for 04/10/2022 due to currently hopsitalized and next ICM transmission scheduled for 04/17/2022.   ?

## 2022-04-10 NOTE — Hospital Course (Addendum)
60 year old female, medical history significant for PAF, former tobacco use, HTN, previous small cell lung cancer treated with chemo, chest XRT and prophylactic brain radiation, CAD s/p CABG, ICM, chronic systolic CHF, AICD, COPD, chronic respiratory failure with hypoxia on as needed home oxygen, recurrent pleural effusions s/p pleurodesis, right renal infarct, type II DM, CKD stage IIIb, presented via EMS to ED on 4/30 with SOB on CPAP by EMS.  Admitted for acute on chronic respiratory failure with hypoxia due to suspected decompensated CHF and COPD exacerbation.  Advanced HF team consulted, diuresed with IV Lasix.  Clinically improved.  S/p Barostim implant by [facility] on 5/4. ?

## 2022-04-10 NOTE — Progress Notes (Signed)
Heart Failure Navigator Progress Note ? ?Assessed for Heart & Vascular TOC clinic readiness.  ?Patient does not meet criteria due to a patient with the Advanced Heart Failure Team..  ? ? ? ?Earnestine Leys, BSN, RN ?Heart Failure Nurse Navigator ?Secure Chat Only   ?

## 2022-04-10 NOTE — Progress Notes (Signed)
? 04/10/22 1200  ?Clinical Encounter Type  ?Visited With Patient;Health care provider ?[facility] M. Rosita Fire, [place] and Dr. Shaune Pascal. [place])  ?Visit Type Initial ?(Advance Directive Education)  ?Referral From Nurse  ?Consult/Referral To Chaplain ?Brittany [name] [place])  ?Spiritual Encounters  ?Spiritual Needs Prayer;Emotional ?(Patient requested Intercessory Prayer)  ? ?Chaplain responded to a consult request for Advance Directive education with [name]. ?  ?Met Brittany [name] at bedside. Brittany [name] discussed her lengthy medical history with stage four - lung cancer, advanced heart failure, and previous sudden-death experiences. Brittany [name] welcomed the conversation about end-of-life planning and shared that she is tired. Brittany [name] has an extraordinary Darrick Meigs faith from which she gains her strength. She stated that during two of her previous sudden death experiences, and while she was in a coma that she experienced two separate instances of great-white presence of her creator sitting with her. She states that she has no fear of dying and looks forward to that time when she passes from this life to the next. ? ?Brittany [name] explained that she has had lengthy conversation with her sister regarding her end of life planning and that she wishes to be allowed to die naturally when it is her time. Brittany [name] stated that she would like for her sister to be her agent in the event that she cannot make health care decisions on her own. Brittany [name] stated that she is still legally married, but has been separated from her husband for more than ten years, therefore it was important complete the Advance Directive at this time. Additionally, she did not want the burden of her end-of-life decisions being placed upon her son. At [facility] request we concluded our visit with intercessory prayer. ? ? I provided Brittany [name] the Advance Directive packet as well as education on Advance Directives-documents an individual completes  to communicate their health care directions in advance of a time when they may need them. Chaplain informed Brittany [name] the documents which may be completed here in the hospital are the Living Will and [place]. ?  ?Chaplain informed patient that the [place] is a legal document in which, an individual names another person, as their [place], to make health care decisions when the individual is not able to make them for themselves. The Health Care Agent's function can be temporary or permanent depending on her ability to make and communicate those decisions independently. Chaplain informed Brittany [name] in the absence of a [place], the state of [place] directs health care providers to look to the following individuals in the order listed: legal guardian; an attorney-in-fact under a general power of attorney (POA) if that POA includes the right to make health care decisions; person's spouse; a 32 of his children; a 53 of adult brothers and sisters; or an individual who has an established relationship with you, who is acting in good faith and who can convey your wishes.  If none of these persons are available or willing to make medical decisions on a patient's behalf, the law allows the patient's doctor to make decisions for them as long as another doctor agrees with those decisions.  Chaplain also informed the patient that the Health Care agent has no decision-making authority over any affairs other than those related to his medical care. ?  ?The chaplain further educated Brittany [name] that a Living Will is a legal document that allows her desire not to receive life-prolonging measures in the event  that they have a condition that is incurable and will result in his death in a short period of time; they are unconscious, and doctors are confident that they will not regain consciousness; and/or they have  advanced dementia or other substantial and irreversible loss of mental function. The chaplain informed Brittany [name] that life-prolonging measures are medical treatments that would only serve to postpone death, including breathing machines, kidney dialysis, antibiotics, artificial nutrition and hydration (tube feeding), and similar forms of treatment and that if an individual is able to express their wishes, they may also make them known without the use of a Living Will, but in the event that she is not able to express  her wishes, a Living Will allows medical providers and the her family and friends to ensure that they are not making decisions on the  her behalf, but rather serving as her voice to convey decisions the she has already made. ?  ?Brittany [name] is aware that the decision to create an advance directive is hers alone and she may choose not to complete the documents or may choose to complete one portion or both.  Brittany [name] was informed that she can revoke the documents at any time by striking through them and writing void or by completing new documents, but that it is also advisable that the individual verbally notify interested parties that their wishes have changed. ?  ?Brittany [name] is also aware that the document must be signed in the presence of a notary public and two witnesses and that this can be done while the patient is still admitted to the hospital or after discharge in the community. If they decide to complete Advance Directives after being discharged from the hospital, they have been advised to notify all interested parties and to provide those documents to their physicians and loved ones in addition to bringing them to the hospital in the event of another hospitalization. ?  ?The chaplain informed the Brittany [name] that if she desires to proceed with completing Advance Directive Documentation while she is still admitted, notary services are  typically available at [facility] between the hours of 1:00 and 3:30 Monday-Thursday. ?  ?When the patient is ready to have these documents completed, the patient should request that their nurse place a spiritual care consult and indicate that the patient is ready to have their advance directives notarized so that arrangements for witnesses and notary public can be made. ?  ?Please page spiritual care if the patient desires further education or has questions. ?  ?Orest Dikes, Ivin Poot., [phone]. ?

## 2022-04-10 NOTE — Consult Note (Addendum)
?  ?Advanced Heart Failure Team Consult Note ? ? ?Primary Physician: Maude Leriche, PA-C ?PCP-Cardiologist:  Dr. [name]  ? ?Reason for Consultation: Acute on chronic systolic heart failure  ? ?HPI:   ? ?Brittany [name] is seen today for evaluation of acute on chronic systolic heart failure at the request of Dr. Verlon Au, Internal Medicine.  ? ?Brittany [name] is a 61 y.o. female with a history of PAF,  previous smoker quit in 2015, hypertension, 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 ?  ?Admitted 02/03/2018 with NSTEMI and shock. Underwent emergent cath 02/03/18 showed LAD 100% stenosed, LCx 95% stenosed. Taken for emergent CABG 02/03/18. Required impella post op. Hospital course complicated by cardiogenic shock, HCAP, A fib, respiratory failure, and swallowing issues. She was discharged to SNF. Discharge weight 103 pounds.  ?  ?In 2019 had multiple hospitalizations for HF and pleural effusion. Underwent pleurodesis at [facility]. ?  ?Admitted 3/20 with NSTEMI and HF. Received DES to ostial ramus into distal left main based on cath below and underwent diuresis. Meds adjusted as tolerated. Echo with EF 25-30%.  ?  ?Admitted 11/21 with COPD flare with mild HF and hemoptysis. CTA showed stable scarring in the right hilum consistent with emphysema. Underwent bronchoscopy on 11/15, no obvious source of bleeding found on examination.  ?  ?Echo 11/21 EF 20-25%. RV mildly HK.  ?  ?Admitted 3/22 with chest pain and shortness of breath. HS-Trop with no trend. Echo with EF < 20%. Treated for AECOPD exacerbation. Breathing improved. Discharged on torsemide 60 mg daily. ?  ?Admitted 4/22 with ADHF. Diuresed with IV lasix and transitioned back to torsemide 80 mg daily x 4 days then down to torsemide 60 mg daily.  ?  ?Admitted 12/22 with R renal infarct felt to be cardio-embolic (no AF found). Started on  Eliquis. ? ?Follows closely with Sharman Cheek and torsemide down to 40 daily (was 60 daily). ?  ?Was seen in ER last month for worsening SOB. CXR ok. Given 1 dose of IV lasix and nebs. D/c from ED. Had f/u in the [facility] on 4/14. She was euvolemic on exam and by Optivol, though ReDs was reading high at 42% (felt inaccurate, this is a common pattern for her). Continued on torsemide 40 daily.  ? ?4/30, presented to ED via EMS w/ SOB. Started 3 days ago and progressively worsened. Was in respiratory distress at time of EMS evaluation, given nebs and IV steroids, placed on cpap then weaned on 4L [place]. COVID neg flu negative. CXR no acute process, +small chronic right effusion. EKG NSR w/ no acute ischemic changes. Hs trop 35>>55. BNP elevated at 2367 (up from 650 2 wks prior). SCr 1.20, K 3.4, WBC 12.8, Hgb 15. Also w/ transient hypotension initially w/  lactic acidosis, w/ lactate at 6.2!  ?PCT <0.10. Started on IV Abx w/ ceftriaxone. BCx NGTD. Repeat LA down to 1.1. Abx narrowed to Doxy.  ? ?She was admitted and started on IV Lasix, 80 mg x 1. Also being treated for suspected COPDE, on  Doxy, Prednisone and nebs. ? ?1L in UOP yesterday. SCr 1.20>>1.17. BP improved, now normotensive. AHF team asked to evaluate.   ? ?She reports feeling better today. [place] down to 2.5L min. Continues w/ wheezing on exam. Reports full compliance w/ diuretics but not c/w daily wts. Admits to dietary indiscretion w/ sodium.  ?  ?Cardidac Studies: ?ECHO  2022: EF < 20% RV normal  ?ECHO 2021: EF 20-25% RV mildly reduced.  ?ECHO 02/12/19: EF 25-30%, RV mildly reduced ?ECHO 02/10/21: EF <20%, G2DD ?  ?05/18/2021 PFT's: moderately severe airway obstruction and a diffusion defect suggesting emphysema. ?  ?LHC 02/12/19: ?Ost LAD to Prox LAD lesion is 100% stenosed. ?Ost Cx to Prox Cx lesion is 100% stenosed. ?Ost Ramus lesion is 99% stenosed. ?Post intervention, there is a 0% residual stenosis. ?A drug-eluting stent was successfully placed using a [place] [id number]. ?SVG and is normal in caliber. ?The graft exhibits no disease. ?Ost 1st Diag lesion is 70% stenosed. ?SVG and is normal in caliber. ?Prox Graft lesion is 100% stenosed. ?1.  Significant underlying two-vessel coronary artery disease with patent SVG to OM and SVG to diagonal which supplies the LAD territory.  Atretic LIMA to LAD given competitive flow from SVG to diagonal.  Severe ostial ramus artery stenosis not supplied by bypass with his area suggestive of plaque rupture. ?2.  Severely elevated left ventricular end-diastolic pressure 34 mmHg.  Left ventricular angiography was not performed. ?3.  Successful angioplasty and drug-eluting stent placement to the ostial ramus extending into the distal left main. ?  ?[place] 2019  ?RA = 8 ?RV = 49/9 ?PA = 47/20 (32) ?PCW = 19 ?Fick cardiac output/index = 4.3/3.1 ?PVR = 3.0 wu ?AO sat = 93% ?PA sat = 62%, 64%  ?Assessment: ?1. Minimally elevated left heart pressures ?2. Mild PAH ?3. Normal cardiac output ?  ? ?Echo  ? ?Review of Systems: [y] = yes, [ ]  = no  ? ?General: Weight gain [ ] ; Weight loss [ ] ; Anorexia [ ] ; Fatigue [ ] ; Fever [ ] ; Chills [ ] ; Weakness [ ]   ?Cardiac: Chest pain/pressure [ Y]; Resting SOB [Y ]; Exertional SOB [ Y]; Orthopnea [ ] ; Pedal Edema [ ] ; Palpitations [ ] ; Syncope [ ] ; Presyncope [ ] ; Paroxysmal nocturnal dyspnea[ ]   ?Pulmonary: Cough [ ] ; Wheezing[Y ]; Hemoptysis[ ] ; Sputum [ ] ; Snoring [ ]   ?GI: Vomiting[ ] ; Dysphagia[ ] ; Melena[ ] ; Hematochezia [ ] ; Heartburn[ ] ; Abdominal pain [ ] ; Constipation [ ] ; Diarrhea [ ] ; BRBPR [ ]   ?GU: Hematuria[ ] ; Dysuria [ ] ; Nocturia[ ]   ?Vascular: Pain in legs with walking [ ] ; Pain in feet with lying flat [ ] ; Non-healing sores [ ] ; Stroke [ ] ; TIA [ ] ; Slurred speech [ ] ;  ?Neuro: Headaches[ ] ; Vertigo[ ] ; Seizures[ ] ; Paresthesias[ ] ;Blurred vision [ ] ; Diplopia [ ] ; Vision changes [ ]   ?Ortho/Skin: Arthritis [ ] ; Joint pain [ ] ; Muscle pain [ ] ; Joint swelling [ ] ; Back Pain [ ] ; Rash [ ]    ?Psych: Depression[ ] ; Anxiety[ ]   ?Heme: Bleeding problems [ ] ; Clotting disorders [ ] ; Anemia [ ]   ?Endocrine: Diabetes [ ] ; Thyroid dysfunction[ ]  ? ?Home Medications ?Prior to Admission medications   ?Medication Sig Start Date End Date Taking? Authorizing Provider  ?albuterol (PROVENTIL) (5 MG/ML) 0.5% nebulizer solution Take 0.5 mLs (2.5 mg total) by nebulization every 6 (six) hours as needed for wheezing or shortness of breath. 03/21/22  Yes Loni Beckwith, PA-C  ?albuterol (VENTOLIN HFA) 108 (90 Base) MCG/ACT inhaler Inhale 2 puffs into the lungs every 6 (six) hours as needed for wheezing or shortness of breath. 03/21/22  Yes Loni Beckwith, PA-C  ?apixaban (ELIQUIS) 5 MG TABS tablet Take 1 tablet (5 mg total) by mouth 2 (two) times daily. ?Patient taking differently: Take 5 mg by mouth  daily. 03/20/22  Yes Shyonna Carlin, Shaune Pascal, MD  ?CORLANOR 7.5 MG TABS tablet TAKE 1 TABLET (7.5 MG TOTAL) BY MOUTH 2 (TWO) TIMES DAILY WITH A MEAL. ?Patient taking differently: Take 7.5 mg by mouth 2 (two) times daily with a meal. 10/10/21  Yes Caton Popowski, Shaune Pascal, MD  ?FARXIGA 10 MG TABS tablet TAKE 1 TABLET (10 MG TOTAL) BY MOUTH DAILY BEFORE BREAKFAST. ?Patient taking differently: 10 mg daily. 08/16/21  Yes Wrenley Sayed, Shaune Pascal, MD  ?Glucerna (GLUCERNA) LIQD Take 237 mLs by mouth See admin instructions. Takes 1 bottle twice a week   Yes [provider]  ?ipratropium-albuterol (DUONEB) 0.5-2.5 (3) MG/3ML SOLN Take 3 mLs by nebulization 2 (two) times daily as needed (for shortness of breath).   Yes [provider]  ?metolazone (ZAROXOLYN) 2.5 MG tablet Take 2.5 mg by mouth daily as needed (edema).   Yes [provider]  ?Multiple Vitamin (MULTIVITAMIN WITH MINERALS) TABS tablet Take 1 tablet by mouth daily. 12/15/21  Yes Hongalgi, Lenis Dickinson, MD  ?naphazoline-pheniramine (VISINE) 0.025-0.3 % ophthalmic solution Place 1 drop into both eyes 4 (four) times daily as needed for eye irritation.   Yes  [provider]  ?nitroGLYCERIN (NITROSTAT) 0.4 MG SL tablet Place 1 tablet (0.4 mg total) under the tongue every 5 (five) minutes as needed for chest pain. 02/14/19  Yes Shirley Friar, PA-C  ?

## 2022-04-10 NOTE — Progress Notes (Signed)
?PROGRESS NOTE ? ? ?Brittany [name]  MOQ:[id number] DOB: November 15, 1961 DOA: 04/09/2022 ?PCP: Scifres, Dorothy, PA-C  ?Brief Narrative:  ? ?61 year old black female home dwelling ?CAD CABG 2019, NSTEMI 02/2019-CRT-D AICD 2/2 depressed EF-->baseline weight/14/23 recently 46 kg ?HFrEF 25% + atrial Fib?  CHA2DS2-VASc ~4 ?CHR PL effusion + pleurodesis ?R renal infarct?  Embolic (was on Eliquis)-prior microscopic hematuria ?COPD/?  Asthma ?DM TY 2+ CKD 3B ?NSCLC under Rx Dr. [name] status post XRT +6 cycles cisplatin etoposide + prophylactic cranial irradiation ??  Lost to follow-up--admitted in [place] several Mo ago. ? ?Had been in ED 4/11-Rx nebs diuretics felt better went home ?She was out in her backyard and a neighbor was mowing the grass and she feels this may have contributed to her symptoms-she also mentions may be some indiscretions with  diet? ? ?Returned via EMS 4/30 SOB on CPAP-Rx in EMS Atrovent albuterol Solu-Medrol mag tripoding ? ?Data = BP 98/43 WBC 12.8 hemoglobin 15 BUN/creatinine 21/1.2 ?BNP 2367, TR = 35 ?COVID influenza negative ?CXR = C HR R PL effusion ? ?Cardiology consulted-not a candidate for advanced therapies 2/2 lung disease not a candidate for BATwire, barostim either ? ?Hospital-Problem based course ? ?Multifactorial acute hypoxic respiratory failure ??  AECHF/pleural effusion-Lasix as per cardiology given JVD elevation--await further cardiology input regarding Lasix dosing--watch creatinine ?AECOPD + underlying XRT lung injury-DuoNeb 4 times daily Rocephin q24 prednisone 40 daily--Continue ceftriaxone 1 g daily and narrow to Doxy in a.m. possibly ?Transient hypotension initially sepsis on admission? ?Procalcitonin negative, lactic acid 6.2-->1.1--narrow antibiotics to orals in a.m. ?CAD CABG 2019 NSTEMI 2020 AICD HFrEF atrial fibrillation CHADVASC >4 ?Per cardiology ?Meds: Corlanor 7.5 twice daily, atorvastatin 80, Farxiga 10 apixaban 5 twice daily ?Metolazone 2.5 Demadex 40  Aldactone 25 held ?Check a.m. mag ?Probable underlying cardiorenal syndrome CKD 3B from diabetes HTN ?Holding diuretics as above-defer to cardiology  ?DM TY 2 prior A1c 5.7 ?CBG 1 20-1 40 continue sliding scale ?Apparently not on home meds? ?SCLC status post XRT, cisplatin etoposide with underlying prophylactic cranial irradiation ?Definitely plays a role in prognosis in terms of advanced cardiac therapies-Dr. [name] added to treatment team ? ?DVT prophylaxis: Eliquis twice daily ?Code Status: Full CODE STATUS ?Family Communication: None ?Disposition:  ?Status is: Inpatient ?Remains inpatient appropriate because: Await cardiology further input ?  ?Consultants:  ?Cardiology ?Oncology added to treatment team none ? ?Procedures: n ? ?Antimicrobials: Ceftriaxone ? ? ?Subjective: ? ?Pleasant awake alert no distress not on oxygen--no chest pain no cough no sputum ? ?Objective: ?Vitals:  ? 04/09/22 1655 04/09/22 1948 04/10/22 0012 04/10/22 0447  ?BP: (!) 112/58 113/61 109/74 115/66  ?Pulse: 91 82 71 73  ?Resp: 17 18 15 16   ?Temp: 98.2 ?F (36.8 ?C) 98.8 ?F (37.1 ?C) 98.1 ?F (36.7 ?C) (!) 97.5 ?F (36.4 ?C)  ?TempSrc: Oral Oral Oral Oral  ?SpO2:  100% 100% 100%  ?Weight:    46.6 kg  ?Height:      ? ? ?Intake/Output Summary (Last 24 hours) at 04/10/2022 1005 ?Last data filed at 04/10/2022 0510 ?Gross per 24 hour  ?Intake 340 ml  ?Output 1050 ml  ?Net -710 ml  ? ?Filed Weights  ? 04/09/22 0708 04/10/22 0447  ?Weight: 46 kg 46.6 kg  ? ? ?Examination: ? ?EOMI NCAT no focal deficit ?JVD 7 to 8 cm 3-4[?] ?S1-S2 murmur holosystolic-normal sinus rhythm on monitors at this time ?Posterolaterally she has no wheeze no rales ?No lower extremity trace edema only ?Neurologically intact ? ? ?Data Reviewed: personally  reviewed  ? ?CBC ?   ?Component Value Date/Time  ? WBC 12.0 (H) 04/10/2022 0014  ? RBC 4.32 04/10/2022 0014  ? HGB 13.3 04/10/2022 0014  ? HGB 17.7 (H) 07/19/2021 0945  ? HGB 17.6 (H) 12/23/2014 0829  ? HCT 41.7 04/10/2022 0014   ? HCT 53.9 (H) 07/19/2021 0945  ? HCT 53.9 (H) 12/23/2014 0829  ? PLT 131 (L) 04/10/2022 0014  ? PLT 169 07/19/2021 0945  ? MCV 96.5 04/10/2022 0014  ? MCV 91 07/19/2021 0945  ? MCV 102.1 (H) 12/23/2014 0829  ? MCH 30.8 04/10/2022 0014  ? MCHC 31.9 04/10/2022 0014  ? RDW 19.4 (H) 04/10/2022 0014  ? RDW 17.5 (H) 07/19/2021 0945  ? RDW 20.9 (H) 12/23/2014 0829  ? LYMPHSABS 1.6 04/09/2022 0700  ? LYMPHSABS 0.7 (L) 12/23/2014 0829  ? MONOABS 1.1 (H) 04/09/2022 0700  ? MONOABS 0.4 12/23/2014 0829  ? EOSABS 0.2 04/09/2022 0700  ? EOSABS 0.2 12/23/2014 0829  ? BASOSABS 0.1 04/09/2022 0700  ? BASOSABS 0.0 12/23/2014 0829  ? ? ?  Latest Ref Rng & Units 04/10/2022  ? 12:14 AM 04/09/2022  ?  7:00 AM 03/24/2022  ? 12:27 PM  ?CMP  ?Glucose 70 - 99 mg/dL 121   278   89    ?BUN 8 - 23 mg/dL 30   21   15     ?Creatinine 0.44 - 1.00 mg/dL 1.17   1.20   1.04    ?Sodium 135 - 145 mmol/L 137   138   139    ?Potassium 3.5 - 5.1 mmol/L 4.2   3.4   3.6    ?Chloride 98 - 111 mmol/L 103   106   103    ?CO2 22 - 32 mmol/L 24   21   26     ?Calcium 8.9 - 10.3 mg/dL 9.4   9.5   9.4    ? ? ? ?Radiology Studies: ?DG Chest Port 1 View ? ?Result Date: 04/09/2022 ?CLINICAL DATA:  61 year old female with shortness of breath, respiratory distress. EXAM: PORTABLE CHEST 1 VIEW COMPARISON:  Chest radiographs 03/21/2022 and earlier. FINDINGS: Portable AP upright view at 0719 hours. Stable cardiomegaly and mediastinal contours. Stable left chest AICD. Prior CABG. Lung volumes and ventilation appear stable from earlier this month, chronic small right pleural effusion and/or pleural adhesion appears stable from last year. Incidental left nipple shadow. Visualized tracheal air column is within normal limits. No acute osseous abnormality identified. Negative visible bowel gas. IMPRESSION: Stable cardiomegaly and small chronic right pleural effusion and/or pleural adhesion. No acute cardiopulmonary abnormality. Electronically Signed   By: Genevie Ann M.D.   On:  04/09/2022 08:15   ? ? ?Scheduled Meds: ? apixaban  5 mg Oral BID  ? atorvastatin  80 mg Oral q1800  ? dapagliflozin propanediol  10 mg Oral Daily  ? doxycycline  100 mg Oral Q12H  ? insulin aspart  0-5 Units Subcutaneous QHS  ? insulin aspart  0-9 Units Subcutaneous TID WC  ? ivabradine  7.5 mg Oral BID WC  ? predniSONE  40 mg Oral Q breakfast  ? sodium chloride flush  3 mL Intravenous Q12H  ? ?Continuous Infusions: ? ? ? ? LOS: 1 day  ? ?Time spent: 30 ? ?Nita Sells, MD ?[facility] ?To contact the attending provider between 7A-7P or the covering provider during after hours 7P-7A, please log into the web site [email or web address] and access using universal [place] password for that web site. If  you do not have the password, please call the hospital operator. ? ?04/10/2022, 10:05 AM  ? ? ?

## 2022-04-11 ENCOUNTER — Inpatient Hospital Stay (HOSPITAL_COMMUNITY): Payer: BC Managed Care – PPO

## 2022-04-11 DIAGNOSIS — I4891 Unspecified atrial fibrillation: Secondary | ICD-10-CM

## 2022-04-11 DIAGNOSIS — I2510 Atherosclerotic heart disease of native coronary artery without angina pectoris: Secondary | ICD-10-CM

## 2022-04-11 DIAGNOSIS — J441 Chronic obstructive pulmonary disease with (acute) exacerbation: Secondary | ICD-10-CM | POA: Diagnosis not present

## 2022-04-11 DIAGNOSIS — I5023 Acute on chronic systolic (congestive) heart failure: Secondary | ICD-10-CM

## 2022-04-11 LAB — CBC
HCT: 44.4 % (ref 36.0–46.0)
Hemoglobin: 14.1 g/dL (ref 12.0–15.0)
MCH: 30.7 pg (ref 26.0–34.0)
MCHC: 31.8 g/dL (ref 30.0–36.0)
MCV: 96.7 fL (ref 80.0–100.0)
Platelets: 142 10*3/uL — ABNORMAL LOW (ref 150–400)
RBC: 4.59 MIL/uL (ref 3.87–5.11)
RDW: 19.7 % — ABNORMAL HIGH (ref 11.5–15.5)
WBC: 13.3 10*3/uL — ABNORMAL HIGH (ref 4.0–10.5)
nRBC: 0 % (ref 0.0–0.2)

## 2022-04-11 LAB — GLUCOSE, CAPILLARY
Glucose-Capillary: 104 mg/dL — ABNORMAL HIGH (ref 70–99)
Glucose-Capillary: 149 mg/dL — ABNORMAL HIGH (ref 70–99)
Glucose-Capillary: 255 mg/dL — ABNORMAL HIGH (ref 70–99)
Glucose-Capillary: 178 mg/dL — ABNORMAL HIGH (ref 70–99)

## 2022-04-11 LAB — COMPREHENSIVE METABOLIC PANEL
ALT: 20 U/L (ref 0–44)
AST: 26 U/L (ref 15–41)
Albumin: 3.7 g/dL (ref 3.5–5.0)
Alkaline Phosphatase: 66 U/L (ref 38–126)
Anion gap: 10 (ref 5–15)
BUN: 38 mg/dL — ABNORMAL HIGH (ref 8–23)
CO2: 24 mmol/L (ref 22–32)
Calcium: 9.8 mg/dL (ref 8.9–10.3)
Chloride: 104 mmol/L (ref 98–111)
Creatinine, Ser: 1.29 mg/dL — ABNORMAL HIGH (ref 0.44–1.00)
GFR, Estimated: 47 mL/min — ABNORMAL LOW (ref >60)
Glucose, Bld: 121 mg/dL — ABNORMAL HIGH (ref 70–99)
Potassium: 4.1 mmol/L (ref 3.5–5.1)
Sodium: 138 mmol/L (ref 135–145)
Total Bilirubin: 0.7 mg/dL (ref 0.3–1.2)
Total Protein: 6.5 g/dL (ref 6.5–8.1)

## 2022-04-11 LAB — MAGNESIUM: Magnesium: 2.3 mg/dL (ref 1.7–2.4)

## 2022-04-11 MED ORDER — DOXYCYCLINE HYCLATE 100 MG PO TABS
100.0000 mg | ORAL_TABLET | Freq: Two times a day (BID) | ORAL | Status: AC
  Administered 2022-04-11 – 2022-04-12 (×3): 100 mg via ORAL
  Filled 2022-04-11 (×3): qty 1

## 2022-04-11 NOTE — H&P (View-Only) (Signed)
? ?[facility] [place] ? ?Patient name: Brittany [name] MRN: [id number] DOB: [birth year] Sex: female ? ? ?REQUESTING PROVIDER:  ? ? Dr. [name] ? ? ?REASON FOR CONSULT:  ?  ?Barostim implant ? ?HISTORY OF PRESENT ILLNESS:  ? ?Brittany [name] is a 61 y.o. female, who I have been asked to evaluate for Barostim device implant.  The patient was admitted for acute on chronic systolic heart failure.  Echocardiogram shows a 20-25% ejection fraction.  She has a Medtronic single chamber ICD.  She has failed Entresto secondary to hyperkalemia.  She is currently getting diuresed.   ? ?She has a history of non-small cell lung cancer.  She is a former smoker.  Her lung cancer was treated with chemotherapy and radiation.  She is on Eliquis for atrial fibrillation.  She had a NSTEMI in 2019, status post cath and emergent CABG.  This was complicated by cardiogenic shock.  She was admitted again in 2020 with NSTEMI and heart failure, status post PCI. ? ?PAST MEDICAL HISTORY  ? ? ?Past Medical History:  ?Diagnosis Date  ? Acute respiratory failure ([place]) 05/05/2018  ? Acute systolic congestive heart failure ([place]) 02/03/2018  ? Allergy   ? Anxiety   ? Asthma   ? DM2 (diabetes mellitus, type 2) ([place]) 10/20/2020  ? Hypertension   ? PAF (paroxysmal atrial fibrillation) ([place])   ? Prophylactic measure 08/03/14-08/19/14  ? Prophyl. cranial radiation 24 Gy  ? S/P emergency CABG x 3 02/03/2018  ? LIMA to LAD, SVG to D1, SVG to OM1, EVH via right thigh with implantation of Impella LD LVAD via direct aortic approach  ? Small cell lung cancer ([place]) 03/16/2014  ? ? ? ?FAMILY HISTORY  ? ?Family History  ?Problem Relation Age of Onset  ? Heart disease Mother   ? Hypertension Mother   ? Heart attack Mother   ? Hypertension Maternal Grandmother   ? Cancer Maternal Grandmother   ? Diabetes Paternal Grandmother   ? Stroke Neg Hx   ? ? ?SOCIAL HISTORY:  ? ?Social History  ? ?Socioeconomic  History  ? Marital status: Married  ?  Spouse name: Michell Heinrich   ? Number of children: 1  ? Years of education: Not on file  ? Highest education level: Not on file  ?Occupational History  ? Not on file  ?Tobacco Use  ? Smoking status: Former  ?  Packs/day: 1.00  ?  Years: 20.00  ?  Pack years: 20.00  ?  Types: Cigarettes  ?  Quit date: 03/13/2014  ?  Years since quitting: 8.0  ? Smokeless tobacco: Never  ?Vaping Use  ? Vaping Use: Never used  ?Substance and Sexual Activity  ? Alcohol use: Yes  ?  Alcohol/week: 8.0 standard drinks  ?  Types: 8 Glasses of wine per week  ? Drug use: Yes  ?  Types: Marijuana  ?  Comment: "medicinal"  ? Sexual activity: Never  ?Other Topics Concern  ? Not on file  ?Social History Narrative  ? Not on file  ? ?Social Determinants of Health  ? ?Financial Resource Strain: Not on file  ?Food Insecurity: Not on file  ?Transportation Needs: Unmet Transportation Needs  ? Lack of Transportation (Medical): Yes  ? Lack of Transportation (Non-Medical): No  ?Physical Activity: Not on file  ?Stress: Not on file  ?Social Connections: Not on file  ?Intimate Partner Violence: Not on file  ? ? ?ALLERGIES:  ? ? ?Allergies  ?Allergen Reactions  ?  Codeine Nausea And Vomiting  ? ? ?CURRENT MEDICATIONS:  ? ? ?Current Facility-Administered Medications  ?Medication Dose Route Frequency Provider Last Rate Last Admin  ? acetaminophen (TYLENOL) tablet 650 mg  650 mg Oral Q6H PRN Norval Morton, MD      ? Or  ? acetaminophen (TYLENOL) suppository 650 mg  650 mg Rectal Q6H PRN Fuller Plan A, MD      ? albuterol (PROVENTIL) (2.5 MG/3ML) 0.083% nebulizer solution 2.5 mg  2.5 mg Nebulization Q2H PRN Fuller Plan A, MD   2.5 mg at 04/10/22 1716  ? atorvastatin (LIPITOR) tablet 80 mg  80 mg Oral q1800 Fuller Plan A, MD   80 mg at 04/11/22 1751  ? benzonatate (TESSALON) capsule 200 mg  200 mg Oral TID PRN Kristopher Oppenheim, DO   200 mg at 04/10/22 0511  ? dapagliflozin propanediol (FARXIGA) tablet 10 mg  10 mg Oral  Daily Arnoldo Lenis, MD   10 mg at 04/11/22 2979  ? doxycycline (VIBRA-TABS) tablet 100 mg  100 mg Oral Q12H Samtani, Jai-Gurmukh, MD      ? furosemide (LASIX) injection 80 mg  80 mg Intravenous BID Lyda Jester M, PA-C   80 mg at 04/11/22 1750  ? guaiFENesin-dextromethorphan (ROBITUSSIN DM) 100-10 MG/5ML syrup 5 mL  5 mL Oral Q4H PRN Fuller Plan A, MD   5 mL at 04/10/22 2121  ? insulin aspart (novoLOG) injection 0-5 Units  0-5 Units Subcutaneous QHS Smith, Rondell A, MD      ? insulin aspart (novoLOG) injection 0-9 Units  0-9 Units Subcutaneous TID WC Norval Morton, MD   5 Units at 04/11/22 1751  ? ivabradine (CORLANOR) tablet 7.5 mg  7.5 mg Oral BID WC Arnoldo Lenis, MD   7.5 mg at 04/11/22 1754  ? predniSONE (DELTASONE) tablet 40 mg  40 mg Oral Q breakfast Fuller Plan A, MD   40 mg at 04/11/22 0853  ? sodium chloride flush (NS) 0.9 % injection 3 mL  3 mL Intravenous Q12H Smith, Rondell A, MD   3 mL at 04/11/22 0901  ? spironolactone (ALDACTONE) tablet 12.5 mg  12.5 mg Oral Daily Lyda Jester M, PA-C   12.5 mg at 04/11/22 8921  ? ? ?REVIEW OF SYSTEMS:  ? ?[X]  denotes positive finding, [ ]  denotes negative finding ?Cardiac  Comments:  ?Chest pain or chest pressure:    ?Shortness of breath upon exertion:    ?Short of breath when lying flat:    ?Irregular heart rhythm:    ?    ?Vascular    ?Pain in calf, thigh, or hip brought on by ambulation:    ?Pain in feet at night that wakes you up from your sleep:     ?Blood clot in your veins:    ?Leg swelling:     ?    ?Pulmonary    ?Oxygen at home:    ?Productive cough:     ?Wheezing:     ?    ?Neurologic    ?Sudden weakness in arms or legs:     ?Sudden numbness in arms or legs:     ?Sudden onset of difficulty speaking or slurred speech:    ?Temporary loss of vision in one eye:     ?Problems with dizziness:     ?    ?Gastrointestinal    ?Blood in stool:     ? ?Vomited blood:     ?    ?Genitourinary    ?Burning when urinating:     ?  Blood in  urine:    ?    ?Psychiatric    ?Major depression:     ?    ?Hematologic    ?Bleeding problems:    ?Problems with blood clotting too easily:    ?    ?Skin    ?Rashes or ulcers:    ?    ?Constitutional    ?Fever or chills:    ? ?PHYSICAL EXAM:  ? ?Vitals:  ? 04/11/22 0000 04/11/22 0322 04/11/22 0521 04/11/22 1442  ?BP:  133/74  117/72  ?Pulse: 63 73 81 83  ?Resp: 13 15 17  (!) 21  ?Temp:  98 ?F (36.7 ?C)  98.4 ?F (36.9 ?C)  ?TempSrc:  Oral  Oral  ?SpO2: 100% 100% 100% 98%  ?Weight:  46 kg    ?Height:      ? ? ?GENERAL: The patient is a well-nourished female, in no acute distress. The vital signs are documented above. ?CARDIAC: There is a regular rate and rhythm.  ?PULMONARY: Nonlabored respirations ?ABDOMEN: Soft and non-tender  ?MUSCULOSKELETAL: There are no major deformities or cyanosis. ?NEUROLOGIC: No focal weakness or paresthesias are detected. ?SKIN: There are no ulcers or rashes noted. ?PSYCHIATRIC: The patient has a normal affect. ? ?STUDIES:  ? ?I have reviewed her carotid duplex which does not show any significant stenosis. ? ?ASSESSMENT and PLAN  ? ?Acute on chronic heart failure: The patient is on maximal medical therapy.  She continues to have early fatigue and shortness of breath.  I have evaluated her for Barostim implant.  She appears to be a good candidate.  I discussed with the patient that we would place this on the right side.  I discussed the details of the operation as well as the risks and benefits and she is eager to proceed.  I am stopping her Eliquis.  She is scheduled for surgery on Thursday.  She will need to be n.p.o. after midnight on Wednesday. ? ? ?Annamarie Major, IV, MD, FACS ?[facility] [place] ?Tel [phone] ?Pager [phone]  ?

## 2022-04-11 NOTE — TOC CM/SW Note (Addendum)
HF TOC CM spoke to pt at bedside. States she had oxygen in the past. Pt will need oxygen prior to dc home. States she was living with sister in an apt but currently living in [place] and with significant other. States she drives but does not have car. States she calls [facility] to get to appts. Has scale and tries to adhere to a low sodium diet. Will continue to follow for dc needs. Jonnie Finner RN3 CCM, Heart Failure TOC CM [phone]  ?

## 2022-04-11 NOTE — Progress Notes (Addendum)
? ? Advanced Heart Failure Rounding Note ? ?PCP-Cardiologist: None  ? ?Subjective:   ?4/30 Midodrine stopped  ?5/ 1 Diuresed with IV lasix. Weight down 102-->101  ? ?Feeling better. Hoping to get Barostim this admit.  ? ? ? ? ?Objective:   ?Weight Range: ?46 kg ?Body mass index is 20.48 kg/m?.  ? ?Vital Signs:   ?Temp:  [98 ?F (36.7 ?C)-98.2 ?F (36.8 ?C)] 98 ?F (36.7 ?C) (05/02 0322) ?Pulse Rate:  [63-94] 81 (05/02 0521) ?Resp:  [13-23] 17 (05/02 0521) ?BP: (133-156)/(74-78) 133/74 (05/02 0322) ?SpO2:  [97 %-100 %] 100 % (05/02 0521) ?Weight:  [46 kg] 46 kg (05/02 0322) ?Last BM Date : 04/09/22 ? ?Weight change: ?Filed Weights  ? 04/09/22 0708 04/10/22 0447 04/11/22 0322  ?Weight: 46 kg 46.6 kg 46 kg  ? ? ?Intake/Output:  ? ?Intake/Output Summary (Last 24 hours) at 04/11/2022 0842 ?Last data filed at 04/11/2022 5188 ?Gross per 24 hour  ?Intake 240 ml  ?Output 1250 ml  ?Net -1010 ml  ?  ? ? ?Physical Exam  ?  ?General:  Thin  No resp difficulty ?HEENT: Normal ?Neck: Supple. JVP 9-10 . Carotids 2+ bilat; no bruits. No lymphadenopathy or thyromegaly appreciated. ?Cor: PMI nondisplaced. Regular rate & rhythm. No rubs, gallops or murmurs. ?Lungs: Clear. On 2 liters. No wheeze ?Abdomen: Soft, nontender, nondistended. No hepatosplenomegaly. No bruits or masses. Good bowel sounds. ?Extremities: No cyanosis, clubbing, rash, edema ?Neuro: Alert & orientedx3, cranial nerves grossly intact. moves all 4 extremities w/o difficulty. Affect pleasant ? ? ?Telemetry  ? ?SR-ST ? ?EKG  ?  ?N/A  ? ?Labs  ?  ?CBC ?Recent Labs  ?  04/09/22 ?0700 04/10/22 ?0014 04/10/22 ?1135  ?WBC 12.8* 12.0* 15.9*  ?NEUTROABS 9.8*  --   --   ?HGB 15.5* 13.3 13.7  ?HCT 48.4* 41.7 43.3  ?MCV 98.8 96.5 96.9  ?PLT 173 131* 131*  ? ?Basic Metabolic Panel ?Recent Labs  ?  04/10/22 ?0014 04/10/22 ?1135 04/11/22 ?0323  ?NA 137 138  --   ?K 4.2 4.3  --   ?CL 103 105  --   ?CO2 24 25  --   ?GLUCOSE 121* 123*  --   ?BUN 30* 29*  --   ?CREATININE 1.17* 1.17*  --    ?CALCIUM 9.4 9.8  --   ?MG 2.2  --  2.3  ? ?Liver Function Tests ?Recent Labs  ?  04/10/22 ?1135  ?AST 24  ?ALT 18  ?ALKPHOS 64  ?BILITOT 0.6  ?PROT 6.9  ?ALBUMIN 3.7  ? ?No results for input(s): LIPASE, AMYLASE in the last 72 hours. ?Cardiac Enzymes ?No results for input(s): CKTOTAL, CKMB, CKMBINDEX, TROPONINI in the last 72 hours. ? ?BNP: ?BNP (last 3 results) ?Recent Labs  ?  03/21/22 ?1400 03/24/22 ?1227 04/09/22 ?0700  ?BNP 1,514.7* 650.7* 2,367.1*  ? ? ?ProBNP (last 3 results) ?Recent Labs  ?  07/19/21 ?0945  ?PROBNP 3,162*  ? ? ? ?D-Dimer ?Recent Labs  ?  04/09/22 ?1317  ?DDIMER 1.28*  ? ?Hemoglobin A1C ?Recent Labs  ?  04/09/22 ?1308  ?HGBA1C 5.9*  ? ?Fasting Lipid Panel ?No results for input(s): CHOL, HDL, LDLCALC, TRIG, CHOLHDL, LDLDIRECT in the last 72 hours. ?Thyroid Function Tests ?No results for input(s): TSH, T4TOTAL, T3FREE, THYROIDAB in the last 72 hours. ? ?Invalid input(s): FREET3 ? ?Other results: ? ? ?Imaging  ? ? ?DG Chest 2 View ? ?Result Date: 04/11/2022 ?CLINICAL DATA:  Pneumonia. EXAM: CHEST - 2 VIEW COMPARISON:  04/09/2022 FINDINGS: The cardiac silhouette, mediastinal and hilar contours are stable. Stable surgical changes related to bypass surgery. Stable right ventricular pacer wire/AICD. Much improved right basilar lung aeration and resolving right pleural effusion. IMPRESSION: Much improved right basilar lung aeration and resolving right pleural effusion. Electronically Signed   By: Marijo Sanes M.D.   On: 04/11/2022 08:28   ? ? ?Medications:   ? ? ?Scheduled Medications: ? apixaban  5 mg Oral BID  ? atorvastatin  80 mg Oral q1800  ? dapagliflozin propanediol  10 mg Oral Daily  ? doxycycline  100 mg Oral Q12H  ? furosemide  80 mg Intravenous BID  ? insulin aspart  0-5 Units Subcutaneous QHS  ? insulin aspart  0-9 Units Subcutaneous TID WC  ? ivabradine  7.5 mg Oral BID WC  ? predniSONE  40 mg Oral Q breakfast  ? sodium chloride flush  3 mL Intravenous Q12H  ? spironolactone  12.5 mg  Oral Daily  ? ? ?Infusions: ? ? ?PRN Medications: ?acetaminophen **OR** acetaminophen, albuterol, benzonatate, guaiFENesin-dextromethorphan ? ? ? ?Patient Profile  ?Brittany [name] is a 61 y.o. 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 ? ?Admitted with HF + COPD ? ? ?Assessment/Plan  ?Acute on Chronic Systolic Heart Failure Due to ICM  ?- Echo 09/04/18 ([facility]) LVEF 20%, Moderate AI, Mild MR, Mild TR, Severe LAE, RV mildly decreased.  ?- Echo 02/12/19: EF 25-30%, RV mildly reduced ?- Echo 11/21 EF 20-25% RV mild HK. ?- Echo 2022 EF < 20% RV normal ?- Echo 1/23 EF 20-25% RV ok  ?- Has Medtronic single chamber ICD  ?- Volume status improving.  ?- Failed Entresto due to hyperkalemia   ?- Continue Farxiga 10 mg daily  ?- \Continue spiro 12.5 mg daily ?- Continue corlanor 7.5 mg bid. ?- No b-blocker due to intolerance with low output and bad COPD with frequent flares ?- Not a candidate for advanced therapies with her degree of lung disease.   ?-  Barostim possibly later this week.  ?- Discussed importance of sodium restriction . Changed diet to low sodium  ?  ?2. Acute COPDE ?- management per primary  ?- on Doxy, prednisone and nebs  ?  ?3. Lactic Acidosis, Resolved   ?- transient hypotension on admit w/ initial LA of 6.2-->1.1 ?- PCT negative, <0.10 ? ?4. Acute on Chronic Hypoxic Respiratory Failure  ?- Multifactorial, former smoker, h/o small cell lung cancer treated with chemo, chest XRT, COPD and HF ?- acute exacerbation 2/2 ACOPDE + a/c CHF  ?- CXR w/o infiltrate. + small pleural effusions. PCT negative  ?- COPD management and diuresis per above  ?  ?5. Recurrent pleural effusions s/p pleurodesis ?- Resolved. Admit CXR ok ?  ?6. PAF ?- CHA2DS2/VASc is at least 4. (CHF, Vasc disease, HTN, Female).   ?- She has history of Afib RVR in  the past in the setting of her Lung CA.  ?- Previously on Xarelto but stopped due to hemoptysis.  ?- Started on Eliquis in 12/22 due to renal infarct. ?- Tolerating Eliquis  ?  ?7. H/o R renal infarct ?- 12/22 ?- Thought to be cardioembolic ?- Continue Eliquis ?   ?8. 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  ?- Chest CT 02/19/18 with mass-like consolidation in R hilum concerning  for recurrent tumor.  ?- Repeat CT 03/27/2018 -No definite findings of locally recurrent tumor in the right lung. 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 Continued chest CT surveillance is advised in 3-6 months. ?- S/p thoracentesis 04/2018. Cytology negative for malignancy.  ?- CTA 11/19 stable scarring in the right hilum consistent with emphysema. Bronchoscopy on 11/15, no obvious source of bleeding ?  ?9. Leukocytosis ?- WBC 12>>15K. AF. PCT negative ?- suspect 2/2 to prednisone for COPDE  ?  ? ?Possible Barostim this admit. Will consult Dr Trula Slade.  ? ? ? ?Length of Stay: 2 ? ?Darrick Grinder, NP  ?04/11/2022, 8:42 AM ? ?Advanced Heart Failure Team ?Pager [phone] (M-F; 7a - 5p)  ?Please contact [facility] for night-coverage after hours (5p -7a ) and weekends on [email or web address] ? ? ?Patient seen and examined with the above-signed Advanced Practice Provider and/or Housestaff. I personally reviewed laboratory data, imaging studies and relevant notes. I independently examined the patient and formulated the important aspects of the plan. I have edited the note to reflect any of my changes or salient points. I have personally discussed the plan with the patient and/or family. ? ?Respiratory status improving with IV diuresis and treatment of AECOPD. No wheezing, orthopnea or PND ? ?General:  Lying in bed No resp difficulty ?HEENT: normal ?Neck: supple. JVP 7  Carotids 2+ bilat; no bruits. No lymphadenopathy or thryomegaly appreciated. ?Cor: PMI nondisplaced.  Regular rate & rhythm. No rubs, gallops or murmurs. ?Lungs: clear ?Abdomen: soft, nontender, nondistended. No hepatosplenomegaly. No bruits or masses. Good bowel sounds. ?Extremities: no cyanosis, clubbing, rash, e

## 2022-04-11 NOTE — Progress Notes (Signed)
?PROGRESS NOTE ? ? ?Brittany [name]  TML:[id number] DOB: 12-Nov-1961 DOA: 04/09/2022 ?PCP: [name], Dorothy, PA-C  ?Brief Narrative:  ? ?61 year old black female home dwelling ?CAD CABG 2019, NSTEMI 02/2019-CRT-D AICD 2/2 depressed EF-->baseline weight/14/23 recently 46 kg ?HFrEF 25% + atrial Fib?  CHA2DS2-VASc ~4 ?CHR PL effusion + pleurodesis ?R renal infarct?  Embolic (was on Eliquis)-prior microscopic hematuria ?COPD/?  Asthma ?DM TY 2+ CKD 3B ?NSCLC under Rx Dr. Earlie [name] status post XRT +6 cycles cisplatin etoposide + prophylactic cranial irradiation ??  Lost to follow-up--admitted in [place] several Mo ago. ? ?Had been in ED 4/11-Rx nebs diuretics felt better went home ?She was out in her backyard and a neighbor was mowing the grass and she feels this may have contributed to her symptoms-she also mentions may be some indiscretions with  diet? ? ?Returned via EMS 4/30 SOB on CPAP-Rx in EMS Atrovent albuterol Solu-Medrol mag tripoding ? ?Data = BP 98/43 WBC 12.8 hemoglobin 15 BUN/creatinine 21/1.2 ?BNP 2367, TR = 35 ?COVID influenza negative ?CXR = C HR R PL effusion ? ?Cardiology consulted-? Barostim ?5/2-improving fluid status, SOB improved ? ?Hospital-Problem based course ? ?Multifactorial acute hypoxic respiratory failure ?? AECHF/pleural effusion--1.7 L weight still about 46 kg--- Per cardiology ?AECOPD + underlying XRT lung injury- ?DuoNeb 4 times daily-Prednisone 40 daily ?ceftriaxone-->doxycycline 5/2-stop date 5/4  ?Transient hypotension initially sepsis on admission? ?Procalcitonin negative, lactic acid 6.2-->1.1--narrow to doxycycline as above ?CAD CABG 2019 NSTEMI 2020 AICD HFrEF atrial fibrillation CHADVASC >4 ?Per cardiology, looks like she is going for [facility] 5/4 c Dr. Trula [name] ?Meds: Corlanor 7.5 twice bd, atorvastatin 80, Farxiga 10 apixaban 5 twice daily ?--Lasix 80 now IV bid, aldactone added 5/1 ?Metolazone 2.5  held ?Probable underlying cardiorenal syndrome CKD 3B from diabetes  HTN ?diuretics as above-defer to cardiology  ?DM TY 2 prior A1c 5.7 ?CBG 149-255 [expected as is on steroids] continue sliding scale only for now--if trends >250 in am, add LA insulin ?Apparently not on home meds? ?SCLC status post XRT, cisplatin etoposide with underlying prophylactic cranial irradiation ?Definitely plays a role in prognosis in terms of advanced cardiac therapies-Dr. Earlie [name] added to treatment team ? ?DVT prophylaxis: Eliquis twice daily ?Code Status: Full CODE STATUS ?Family Communication: None ?Disposition:  ?Status is: Inpatient ?Remains inpatient appropriate because: Await cardiology further input ?  ?Consultants:  ?Cardiology ?Oncology added to treatment team none ? ?Procedures: n ? ?Antimicrobials: Ceftriaxone ? ? ?Subjective: ? ?Well ?Less SOb ?Shares frustrations with dealing c family Brittany [name] are questioning her decision to have living will] ? ?Objective: ?Vitals:  ? 04/10/22 1942 04/11/22 0000 04/11/22 6812 04/11/22 0521  ?BP:   133/74   ?Pulse:  63 73 81  ?Resp:  13 15 17   ?Temp: 98.1 ?F (36.7 ?C)  98 ?F (36.7 ?C)   ?TempSrc: Oral  Oral   ?SpO2:  100% 100% 100%  ?Weight:   46 kg   ?Height:      ? ? ?Intake/Output Summary (Last 24 hours) at 04/11/2022 0914 ?Last data filed at 04/11/2022 7517 ?Gross per 24 hour  ?Intake 240 ml  ?Output 1250 ml  ?Net -1010 ml  ? ? ?Filed Weights  ? 04/09/22 0708 04/10/22 0447 04/11/22 0322  ?Weight: 46 kg 46.6 kg 46 kg  ? ? ?Examination: ? ?EOMI NCAT no focal deficit ?JVD up, but less than yesterday ?S1-S2 murmur holosystolic-normal sinus rhythm on monitors at this time ?Trace LE edema ?Neurologically intact ? ? ?Data Reviewed: personally reviewed  ? ?CBC ?   ?Component  Value Date/Time  ? WBC 13.3 (H) 04/11/2022 0850  ? RBC 4.59 04/11/2022 0850  ? HGB 14.1 04/11/2022 0850  ? HGB 17.7 (H) 07/19/2021 0945  ? HGB 17.6 (H) 12/23/2014 0829  ? HCT 44.4 04/11/2022 0850  ? HCT 53.9 (H) 07/19/2021 0945  ? HCT 53.9 (H) 12/23/2014 0829  ? PLT 142 (L) 04/11/2022 0850  ? PLT  169 07/19/2021 0945  ? MCV 96.7 04/11/2022 0850  ? MCV 91 07/19/2021 0945  ? MCV 102.1 (H) 12/23/2014 0829  ? MCH 30.7 04/11/2022 0850  ? MCHC 31.8 04/11/2022 0850  ? RDW 19.7 (H) 04/11/2022 0850  ? RDW 17.5 (H) 07/19/2021 0945  ? RDW 20.9 (H) 12/23/2014 0829  ? LYMPHSABS 1.6 04/09/2022 0700  ? LYMPHSABS 0.7 (L) 12/23/2014 0829  ? MONOABS 1.1 (H) 04/09/2022 0700  ? MONOABS 0.4 12/23/2014 0829  ? EOSABS 0.2 04/09/2022 0700  ? EOSABS 0.2 12/23/2014 0829  ? BASOSABS 0.1 04/09/2022 0700  ? BASOSABS 0.0 12/23/2014 0829  ? ? ?  Latest Ref Rng & Units 04/10/2022  ? 11:35 AM 04/10/2022  ? 12:14 AM 04/09/2022  ?  7:00 AM  ?CMP  ?Glucose 70 - 99 mg/dL 123   121   278    ?BUN 8 - 23 mg/dL 29   30   21     ?Creatinine 0.44 - 1.00 mg/dL 1.17   1.17   1.20    ?Sodium 135 - 145 mmol/L 138   137   138    ?Potassium 3.5 - 5.1 mmol/L 4.3   4.2   3.4    ?Chloride 98 - 111 mmol/L 105   103   106    ?CO2 22 - 32 mmol/L 25   24   21     ?Calcium 8.9 - 10.3 mg/dL 9.8   9.4   9.5    ?Total Protein 6.5 - 8.1 g/dL 6.9      ?Total Bilirubin 0.3 - 1.2 mg/dL 0.6      ?Alkaline Phos 38 - 126 U/L 64      ?AST 15 - 41 U/L 24      ?ALT 0 - 44 U/L 18      ? ? ? ?Radiology Studies: ?DG Chest 2 View ? ?Result Date: 04/11/2022 ?CLINICAL DATA:  Pneumonia. EXAM: CHEST - 2 VIEW COMPARISON:  04/09/2022 FINDINGS: The cardiac silhouette, mediastinal and hilar contours are stable. Stable surgical changes related to bypass surgery. Stable right ventricular pacer wire/AICD. Much improved right basilar lung aeration and resolving right pleural effusion. IMPRESSION: Much improved right basilar lung aeration and resolving right pleural effusion. Electronically Signed   By: Marijo Sanes M.D.   On: 04/11/2022 08:28   ? ? ?Scheduled Meds: ? apixaban  5 mg Oral BID  ? atorvastatin  80 mg Oral q1800  ? dapagliflozin propanediol  10 mg Oral Daily  ? doxycycline  100 mg Oral Q12H  ? furosemide  80 mg Intravenous BID  ? insulin aspart  0-5 Units Subcutaneous QHS  ? insulin aspart   0-9 Units Subcutaneous TID WC  ? ivabradine  7.5 mg Oral BID WC  ? predniSONE  40 mg Oral Q breakfast  ? sodium chloride flush  3 mL Intravenous Q12H  ? spironolactone  12.5 mg Oral Daily  ? ?Continuous Infusions: ? ? ? ? LOS: 2 days  ? ?Time spent: 89 ? ?Nita Sells, MD ?[facility] ?To contact the attending provider between 7A-7P or the covering provider during after hours  7P-7A, please log into the web site [email or web address] and access using universal [place] password for that web site. If you do not have the password, please call the hospital operator. ? ?04/11/2022, 9:14 AM  ? ? ?

## 2022-04-11 NOTE — TOC CM/SW Note (Signed)
SATURATION QUALIFICATIONS: (This note is used to comply with regulatory documentation for home oxygen) ? ?Patient Saturations on Room Air at Rest = 82% ? ? ?Patient Saturations on 2 Liters of oxygen while Ambulating = 92% ? ?Please briefly explain why patient needs home oxygen: COPD, HF ?

## 2022-04-11 NOTE — Consult Note (Signed)
? ?[facility] ? ?Patient name: Brittany [name] MRN: [id number] DOB: 01-Jul-1961 Sex: female ? ? ?REQUESTING PROVIDER:  ? ? Dr. [name] ? ? ?REASON FOR CONSULT:  ?  ?Barostim implant ? ?HISTORY OF PRESENT ILLNESS:  ? ?Brittany [name] is a 61 y.o. female, who I have been asked to evaluate for Barostim device implant.  The patient was admitted for acute on chronic systolic heart failure.  Echocardiogram shows a 20-25% ejection fraction.  She has a Medtronic single chamber ICD.  She has failed Entresto secondary to hyperkalemia.  She is currently getting diuresed.   ? ?She has a history of non-small cell lung cancer.  She is a former smoker.  Her lung cancer was treated with chemotherapy and radiation.  She is on Eliquis for atrial fibrillation.  She had a NSTEMI in 2019, status post cath and emergent CABG.  This was complicated by cardiogenic shock.  She was admitted again in 2020 with NSTEMI and heart failure, status post PCI. ? ?PAST MEDICAL HISTORY  ? ? ?Past Medical History:  ?Diagnosis Date  ? Acute respiratory failure ([place]) 05/05/2018  ? Acute systolic congestive heart failure ([place]) 02/03/2018  ? Allergy   ? Anxiety   ? Asthma   ? DM2 (diabetes mellitus, type 2) ([place]) 10/20/2020  ? Hypertension   ? PAF (paroxysmal atrial fibrillation) ([place])   ? Prophylactic measure 08/03/14-08/19/14  ? Prophyl. cranial radiation 24 Gy  ? S/P emergency CABG x 3 02/03/2018  ? LIMA to LAD, SVG to D1, SVG to OM1, EVH via right thigh with implantation of Impella LD LVAD via direct aortic approach  ? Small cell lung cancer ([place]) 03/16/2014  ? ? ? ?FAMILY HISTORY  ? ?Family History  ?Problem Relation Age of Onset  ? Heart disease Mother   ? Hypertension Mother   ? Heart attack Mother   ? Hypertension Maternal Grandmother   ? Cancer Maternal Grandmother   ? Diabetes Paternal Grandmother   ? Stroke Neg Hx   ? ? ?SOCIAL HISTORY:  ? ?Social History  ? ?Socioeconomic  History  ? Marital status: Married  ?  Spouse name: Michell Heinrich   ? Number of children: 1  ? Years of education: Not on file  ? Highest education level: Not on file  ?Occupational History  ? Not on file  ?Tobacco Use  ? Smoking status: Former  ?  Packs/day: 1.00  ?  Years: 20.00  ?  Pack years: 20.00  ?  Types: Cigarettes  ?  Quit date: 03/13/2014  ?  Years since quitting: 8.0  ? Smokeless tobacco: Never  ?Vaping Use  ? Vaping Use: Never used  ?Substance and Sexual Activity  ? Alcohol use: Yes  ?  Alcohol/week: 8.0 standard drinks  ?  Types: 8 Glasses of wine per week  ? Drug use: Yes  ?  Types: Marijuana  ?  Comment: "medicinal"  ? Sexual activity: Never  ?Other Topics Concern  ? Not on file  ?Social History Narrative  ? Not on file  ? ?Social Determinants of Health  ? ?Financial Resource Strain: Not on file  ?Food Insecurity: Not on file  ?Transportation Needs: Unmet Transportation Needs  ? Lack of Transportation (Medical): Yes  ? Lack of Transportation (Non-Medical): No  ?Physical Activity: Not on file  ?Stress: Not on file  ?Social Connections: Not on file  ?Intimate Partner Violence: Not on file  ? ? ?ALLERGIES:  ? ? ?Allergies  ?Allergen Reactions  ?  Codeine Nausea And Vomiting  ? ? ?CURRENT MEDICATIONS:  ? ? ?Current Facility-Administered Medications  ?Medication Dose Route Frequency Provider Last Rate Last Admin  ? acetaminophen (TYLENOL) tablet 650 mg  650 mg Oral Q6H PRN Norval Morton, MD      ? Or  ? acetaminophen (TYLENOL) suppository 650 mg  650 mg Rectal Q6H PRN Fuller Plan A, MD      ? albuterol (PROVENTIL) (2.5 MG/3ML) 0.083% nebulizer solution 2.5 mg  2.5 mg Nebulization Q2H PRN Fuller Plan A, MD   2.5 mg at 04/10/22 1716  ? atorvastatin (LIPITOR) tablet 80 mg  80 mg Oral q1800 Fuller Plan A, MD   80 mg at 04/11/22 1751  ? benzonatate (TESSALON) capsule 200 mg  200 mg Oral TID PRN Kristopher Oppenheim, DO   200 mg at 04/10/22 0511  ? dapagliflozin propanediol (FARXIGA) tablet 10 mg  10 mg Oral  Daily Arnoldo Lenis, MD   10 mg at 04/11/22 1610  ? doxycycline (VIBRA-TABS) tablet 100 mg  100 mg Oral Q12H Samtani, Jai-Gurmukh, MD      ? furosemide (LASIX) injection 80 mg  80 mg Intravenous BID Lyda Jester M, PA-C   80 mg at 04/11/22 1750  ? guaiFENesin-dextromethorphan (ROBITUSSIN DM) 100-10 MG/5ML syrup 5 mL  5 mL Oral Q4H PRN Fuller Plan A, MD   5 mL at 04/10/22 2121  ? insulin aspart (novoLOG) injection 0-5 Units  0-5 Units Subcutaneous QHS Smith, Rondell A, MD      ? insulin aspart (novoLOG) injection 0-9 Units  0-9 Units Subcutaneous TID WC Norval Morton, MD   5 Units at 04/11/22 1751  ? ivabradine (CORLANOR) tablet 7.5 mg  7.5 mg Oral BID WC Arnoldo Lenis, MD   7.5 mg at 04/11/22 1754  ? predniSONE (DELTASONE) tablet 40 mg  40 mg Oral Q breakfast Fuller Plan A, MD   40 mg at 04/11/22 0853  ? sodium chloride flush (NS) 0.9 % injection 3 mL  3 mL Intravenous Q12H Smith, Rondell A, MD   3 mL at 04/11/22 0901  ? spironolactone (ALDACTONE) tablet 12.5 mg  12.5 mg Oral Daily Lyda Jester M, PA-C   12.5 mg at 04/11/22 9604  ? ? ?REVIEW OF SYSTEMS:  ? ?[X]  denotes positive finding, [ ]  denotes negative finding ?Cardiac  Comments:  ?Chest pain or chest pressure:    ?Shortness of breath upon exertion:    ?Short of breath when lying flat:    ?Irregular heart rhythm:    ?    ?Vascular    ?Pain in calf, thigh, or hip brought on by ambulation:    ?Pain in feet at night that wakes you up from your sleep:     ?Blood clot in your veins:    ?Leg swelling:     ?    ?Pulmonary    ?Oxygen at home:    ?Productive cough:     ?Wheezing:     ?    ?Neurologic    ?Sudden weakness in arms or legs:     ?Sudden numbness in arms or legs:     ?Sudden onset of difficulty speaking or slurred speech:    ?Temporary loss of vision in one eye:     ?Problems with dizziness:     ?    ?Gastrointestinal    ?Blood in stool:     ? ?Vomited blood:     ?    ?Genitourinary    ?Burning when urinating:     ?  Blood in  urine:    ?    ?Psychiatric    ?Major depression:     ?    ?Hematologic    ?Bleeding problems:    ?Problems with blood clotting too easily:    ?    ?Skin    ?Rashes or ulcers:    ?    ?Constitutional    ?Fever or chills:    ? ?PHYSICAL EXAM:  ? ?Vitals:  ? 04/11/22 0000 04/11/22 0322 04/11/22 0521 04/11/22 1442  ?BP:  133/74  117/72  ?Pulse: 63 73 81 83  ?Resp: 13 15 17  (!) 21  ?Temp:  98 ?F (36.7 ?C)  98.4 ?F (36.9 ?C)  ?TempSrc:  Oral  Oral  ?SpO2: 100% 100% 100% 98%  ?Weight:  46 kg    ?Height:      ? ? ?GENERAL: The patient is a well-nourished female, in no acute distress. The vital signs are documented above. ?CARDIAC: There is a regular rate and rhythm.  ?PULMONARY: Nonlabored respirations ?ABDOMEN: Soft and non-tender  ?MUSCULOSKELETAL: There are no major deformities or cyanosis. ?NEUROLOGIC: No focal weakness or paresthesias are detected. ?SKIN: There are no ulcers or rashes noted. ?PSYCHIATRIC: The patient has a normal affect. ? ?STUDIES:  ? ?I have reviewed her carotid duplex which does not show any significant stenosis. ? ?ASSESSMENT and PLAN  ? ?Acute on chronic heart failure: The patient is on maximal medical therapy.  She continues to have early fatigue and shortness of breath.  I have evaluated her for Barostim implant.  She appears to be a good candidate.  I discussed with the patient that we would place this on the right side.  I discussed the details of the operation as well as the risks and benefits and she is eager to proceed.  I am stopping her Eliquis.  She is scheduled for surgery on Thursday.  She will need to be n.p.o. after midnight on Wednesday. ? ? ?Annamarie Major, IV, MD, FACS ?[facility] ?Tel [phone] ?Pager [phone]  ?

## 2022-04-11 NOTE — Progress Notes (Signed)
?   04/11/22 1330  ?Clinical Encounter Type  ?Visited With Patient  ?Visit Type Follow-up ?(Advance Directive)  ?Referral From Nurse  ?Consult/Referral To Chaplain ?Albertina Parr [place])  ? ?Reviewed [name]'s H.C.P.O.A. with her.  ?H.C.P.O.A. is complete, need to arrange for Notary. ?

## 2022-04-11 NOTE — Progress Notes (Signed)
Scheduled for Barostim on 5/4. Per Dr. Trula Slade, will need to hold Eliquis.  ? ?Lyda Jester, PA-C  ?04/11/2022 ? ?

## 2022-04-11 NOTE — Plan of Care (Signed)
?  Problem: Education: ?Goal: Ability to demonstrate management of disease process will improve ?04/11/2022 2047 by Kaylyn Lim, RN ?Outcome: Progressing ?04/11/2022 2047 by Kaylyn Lim, RN ?Outcome: Progressing ?  ?Problem: Activity: ?Goal: Capacity to carry out activities will improve ?04/11/2022 2047 by Kaylyn Lim, RN ?Outcome: Progressing ?04/11/2022 2047 by Kaylyn Lim, RN ?Outcome: Progressing ?  ?Problem: Cardiac: ?Goal: Ability to achieve and maintain adequate cardiopulmonary perfusion will improve ?04/11/2022 2047 by Kaylyn Lim, RN ?Outcome: Progressing ?04/11/2022 2047 by Kaylyn Lim, RN ?Outcome: Progressing ?  ?Problem: Education: ?Goal: Knowledge of General Education information will improve ?Description: Including pain rating scale, medication(s)/side effects and non-pharmacologic comfort measures ?04/11/2022 2047 by Kaylyn Lim, RN ?Outcome: Progressing ?04/11/2022 2047 by Kaylyn Lim, RN ?Outcome: Progressing ?  ?Problem: Clinical Measurements: ?Goal: Respiratory complications will improve ?04/11/2022 2047 by Kaylyn Lim, RN ?Outcome: Progressing ?04/11/2022 2047 by Kaylyn Lim, RN ?Outcome: Progressing ?  ?Problem: Clinical Measurements: ?Goal: Cardiovascular complication will be avoided ?04/11/2022 2047 by Kaylyn Lim, RN ?Outcome: Progressing ?04/11/2022 2047 by Kaylyn Lim, RN ?Outcome: Progressing ?  ?Problem: Activity: ?Goal: Risk for activity intolerance will decrease ?04/11/2022 2047 by Kaylyn Lim, RN ?Outcome: Progressing ?04/11/2022 2047 by Kaylyn Lim, RN ?Outcome: Progressing ?  ?Problem: Safety: ?Goal: Ability to remain free from injury will improve ?04/11/2022 2047 by Kaylyn Lim, RN ?Outcome: Progressing ?04/11/2022 2047 by Kaylyn Lim, RN ?Outcome: Progressing ?  ?

## 2022-04-12 DIAGNOSIS — I5023 Acute on chronic systolic (congestive) heart failure: Secondary | ICD-10-CM | POA: Diagnosis not present

## 2022-04-12 DIAGNOSIS — I480 Paroxysmal atrial fibrillation: Secondary | ICD-10-CM | POA: Diagnosis not present

## 2022-04-12 DIAGNOSIS — J441 Chronic obstructive pulmonary disease with (acute) exacerbation: Secondary | ICD-10-CM | POA: Diagnosis not present

## 2022-04-12 DIAGNOSIS — J9621 Acute and chronic respiratory failure with hypoxia: Secondary | ICD-10-CM | POA: Diagnosis not present

## 2022-04-12 DIAGNOSIS — E872 Acidosis, unspecified: Secondary | ICD-10-CM

## 2022-04-12 LAB — COMPREHENSIVE METABOLIC PANEL
ALT: 24 U/L (ref 0–44)
AST: 22 U/L (ref 15–41)
Albumin: 3.6 g/dL (ref 3.5–5.0)
Alkaline Phosphatase: 59 U/L (ref 38–126)
Anion gap: 9 (ref 5–15)
BUN: 41 mg/dL — ABNORMAL HIGH (ref 8–23)
CO2: 25 mmol/L (ref 22–32)
Calcium: 9.7 mg/dL (ref 8.9–10.3)
Chloride: 103 mmol/L (ref 98–111)
Creatinine, Ser: 1.38 mg/dL — ABNORMAL HIGH (ref 0.44–1.00)
GFR, Estimated: 44 mL/min — ABNORMAL LOW (ref >60)
Glucose, Bld: 95 mg/dL (ref 70–99)
Potassium: 3.9 mmol/L (ref 3.5–5.1)
Sodium: 137 mmol/L (ref 135–145)
Total Bilirubin: 0.8 mg/dL (ref 0.3–1.2)
Total Protein: 6.6 g/dL (ref 6.5–8.1)

## 2022-04-12 LAB — GLUCOSE, CAPILLARY
Glucose-Capillary: 96 mg/dL (ref 70–99)
Glucose-Capillary: 187 mg/dL — ABNORMAL HIGH (ref 70–99)
Glucose-Capillary: 208 mg/dL — ABNORMAL HIGH (ref 70–99)
Glucose-Capillary: 219 mg/dL — ABNORMAL HIGH (ref 70–99)

## 2022-04-12 LAB — CBC
HCT: 45.6 % (ref 36.0–46.0)
Hemoglobin: 14.6 g/dL (ref 12.0–15.0)
MCH: 30.5 pg (ref 26.0–34.0)
MCHC: 32 g/dL (ref 30.0–36.0)
MCV: 95.2 fL (ref 80.0–100.0)
Platelets: 170 10*3/uL (ref 150–400)
RBC: 4.79 MIL/uL (ref 3.87–5.11)
RDW: 19.4 % — ABNORMAL HIGH (ref 11.5–15.5)
WBC: 10.6 10*3/uL — ABNORMAL HIGH (ref 4.0–10.5)
nRBC: 0 % (ref 0.0–0.2)

## 2022-04-12 LAB — SURGICAL PCR SCREEN
MRSA, PCR: NEGATIVE
Staphylococcus aureus: NEGATIVE

## 2022-04-12 LAB — MAGNESIUM: Magnesium: 2.1 mg/dL (ref 1.7–2.4)

## 2022-04-12 MED ORDER — POTASSIUM CHLORIDE CRYS ER 20 MEQ PO TBCR
20.0000 meq | EXTENDED_RELEASE_TABLET | Freq: Once | ORAL | Status: AC
  Administered 2022-04-12: 20 meq via ORAL
  Filled 2022-04-12: qty 1

## 2022-04-12 MED ORDER — CEFAZOLIN SODIUM-DEXTROSE 1-4 GM/50ML-% IV SOLN
1.0000 g | INTRAVENOUS | Status: AC
  Administered 2022-04-13: 1 g via INTRAVENOUS
  Filled 2022-04-12: qty 50

## 2022-04-12 MED ORDER — CHLORHEXIDINE GLUCONATE CLOTH 2 % EX PADS
6.0000 | MEDICATED_PAD | Freq: Once | CUTANEOUS | Status: AC
  Administered 2022-04-12: 6 via TOPICAL

## 2022-04-12 NOTE — Progress Notes (Signed)
?   04/12/22 0900  ?Clinical Encounter Type  ?Visited With Health care provider  ?Visit Type Follow-up  ?Referral From Nurse  ?Consult/Referral To Chaplain ?Albertina Parr [place])  ? ?Attempted to coordinated signing of A.D. ?Volunteers Available, but ?NO NOTARY AVAILABLE. ?[address] [place] [phone]. ?

## 2022-04-12 NOTE — Assessment & Plan Note (Signed)
Lactate peaked to 6.2. ?May be related to respiratory distress and decompensated CHF.  Sepsis ruled out. ?PCT negative. ?Resolved. ?

## 2022-04-12 NOTE — Progress Notes (Addendum)
?  04/12/22 1020  ?Clinical Encounter Type  ?Visited With Patient  ?Visit Type Follow-up;Pre-op ?Music therapist)  ?Referral From Nurse ?Osker Mason, RN)  ?Consult/Referral To Chaplain ?Albertina Parr [place])  ? ?Coordinated Notary and two volunteers from [employer] for the signing of Brittany [name] Advance Directive: [place] and Living Will. Met Brittany [name] at the patient?s bedside. Patient indicated that her wishes for [name] [phone] and [name] were clearly indicated on PART A: H.C. P.O.A. and that her wishes were clearly indicated on PART B: Living Will  ? ? Brittany [name] signed her Advance Directive in the presence of [place]; and volunteer witnesses Pieter Partridge and Cyndia Diver, all of whom have signed in the appropriate spaces on Part: C.  ? ? A copy of Brittany [name] A.D. was placed in her hard chart, a copy was emailed for scanning into her electronic medical record, the original and two copies were then returned to the patient.  ? ? Chaplain Melvenia Beam, M. [place]. [phone].  ?

## 2022-04-12 NOTE — Assessment & Plan Note (Addendum)
A1c 5.9 on 04/09/2022 ?Labile CBGs but reasonable, while on prednisone in the hospital.  No prednisone at discharge. ?Monitor CBGs periodically at home. ?

## 2022-04-12 NOTE — Progress Notes (Addendum)
? ? Advanced Heart Failure Rounding Note ? ?PCP-Cardiologist: None  ? ?Subjective:   ?4/30 Midodrine stopped  ?5/1 Diuresed with IV lasix. Weight down 102-->101  ? ?2.3L in UOP yesterday. Wt down another 3 lb.  ? ?Breathing overall improved but still w/ DOE. SOB bathing this morning.  ? ?Scr 1.3>>1.4. Urine clear in purwick  ? ?Scheduled for Barostim implant tomorrow. Eliquis on hold.  ? ? ? ?Objective:   ?Weight Range: ?44.6 kg ?Body mass index is 19.87 kg/m?.  ? ?Vital Signs:   ?Temp:  [97.7 ?F (36.5 ?C)-98.5 ?F (36.9 ?C)] 98.5 ?F (36.9 ?C) (05/03 5188) ?Pulse Rate:  [60-83] 75 (05/03 0723) ?Resp:  [14-21] 16 (05/03 0723) ?BP: (103-117)/(62-85) 116/85 (05/03 0723) ?SpO2:  [96 %-100 %] 96 % (05/03 0723) ?Weight:  [44.6 kg] 44.6 kg (05/03 0559) ?Last BM Date : 04/11/22 ? ?Weight change: ?Filed Weights  ? 04/10/22 0447 04/11/22 0322 04/12/22 0559  ?Weight: 46.6 kg 46 kg 44.6 kg  ? ? ?Intake/Output:  ? ?Intake/Output Summary (Last 24 hours) at 04/12/2022 1150 ?Last data filed at 04/12/2022 1024 ?Gross per 24 hour  ?Intake 670 ml  ?Output 2800 ml  ?Net -2130 ml  ?  ? ? ?Physical Exam  ?  ?General:  thin female No respiratory difficulty ?HEENT: normal ?Neck: supple. JVD 9 cm. Carotids 2+ bilat; no bruits. No lymphadenopathy or thyromegaly appreciated. ?Cor: PMI nondisplaced. Regular rate & rhythm. No rubs, gallops or murmurs. ?Lungs: clear ?Abdomen: soft, nontender, nondistended. No hepatosplenomegaly. No bruits or masses. Good bowel sounds. ?Extremities: no cyanosis, clubbing, rash, edema ?Neuro: alert & oriented x 3, cranial nerves grossly intact. moves all 4 extremities w/o difficulty. Affect pleasant. ? ? ?Telemetry  ? ?NSR 70s  ? ?EKG  ?  ?N/A  ? ?Labs  ?  ?CBC ?Recent Labs  ?  04/11/22 ?0850 04/12/22 ?0252  ?WBC 13.3* 10.6*  ?HGB 14.1 14.6  ?HCT 44.4 45.6  ?MCV 96.7 95.2  ?PLT 142* 170  ? ?Basic Metabolic Panel ?Recent Labs  ?  04/11/22 ?0323 04/11/22 ?0850 04/12/22 ?0252  ?NA  --  138 137  ?K  --  4.1 3.9  ?CL  --   104 103  ?CO2  --  24 25  ?GLUCOSE  --  121* 95  ?BUN  --  38* 41*  ?CREATININE  --  1.29* 1.38*  ?CALCIUM  --  9.8 9.7  ?MG 2.3  --  2.1  ? ?Liver Function Tests ?Recent Labs  ?  04/11/22 ?0850 04/12/22 ?0252  ?AST 26 22  ?ALT 20 24  ?ALKPHOS 66 59  ?BILITOT 0.7 0.8  ?PROT 6.5 6.6  ?ALBUMIN 3.7 3.6  ? ?No results for input(s): LIPASE, AMYLASE in the last 72 hours. ?Cardiac Enzymes ?No results for input(s): CKTOTAL, CKMB, CKMBINDEX, TROPONINI in the last 72 hours. ? ?BNP: ?BNP (last 3 results) ?Recent Labs  ?  03/21/22 ?1400 03/24/22 ?1227 04/09/22 ?0700  ?BNP 1,514.7* 650.7* 2,367.1*  ? ? ?ProBNP (last 3 results) ?Recent Labs  ?  07/19/21 ?0945  ?PROBNP 3,162*  ? ? ? ?D-Dimer ?Recent Labs  ?  04/09/22 ?1317  ?DDIMER 1.28*  ? ?Hemoglobin A1C ?Recent Labs  ?  04/09/22 ?1308  ?HGBA1C 5.9*  ? ?Fasting Lipid Panel ?No results for input(s): CHOL, HDL, LDLCALC, TRIG, CHOLHDL, LDLDIRECT in the last 72 hours. ?Thyroid Function Tests ?No results for input(s): TSH, T4TOTAL, T3FREE, THYROIDAB in the last 72 hours. ? ?Invalid input(s): FREET3 ? ?Other results: ? ? ?Imaging  ? ? ?  No results found. ? ? ?Medications:   ? ? ?Scheduled Medications: ? atorvastatin  80 mg Oral q1800  ? dapagliflozin propanediol  10 mg Oral Daily  ? doxycycline  100 mg Oral Q12H  ? furosemide  80 mg Intravenous BID  ? insulin aspart  0-5 Units Subcutaneous QHS  ? insulin aspart  0-9 Units Subcutaneous TID WC  ? ivabradine  7.5 mg Oral BID WC  ? predniSONE  40 mg Oral Q breakfast  ? sodium chloride flush  3 mL Intravenous Q12H  ? spironolactone  12.5 mg Oral Daily  ? ? ?Infusions: ? [START ON 04/13/2022]  ceFAZolin (ANCEF) IV    ? ? ?PRN Medications: ?acetaminophen **OR** acetaminophen, albuterol, benzonatate, guaiFENesin-dextromethorphan ? ? ? ?Patient Profile  ?Blessen Kimbrough is a 61 y.o. 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 ? ?Admitted with HF + COPD ? ? ?Assessment/Plan  ?Acute on Chronic Systolic Heart Failure Due to ICM  ?- Echo 09/04/18 ([facility]) LVEF 20%, Moderate AI, Mild MR, Mild TR, Severe LAE, RV mildly decreased.  ?- Echo 02/12/19: EF 25-30%, RV mildly reduced ?- Echo 11/21 EF 20-25% RV mild HK. ?- Echo 2022 EF < 20% RV normal ?- Echo 1/23 EF 20-25% RV ok  ?- Has Med[employer] single chamber ICD  ?- Volume status improving. Still w/ mild fluid overload. IV Lasix 80 mg x 1 today  ?- Failed Entresto due to hyperkalemia   ?- Continue Farxiga 10 mg daily  ?- Continue spiro 12.5 mg daily ?- Continue corlanor 7.5 mg bid. ?- No b-blocker due to intolerance with low output and bad COPD with frequent flares ?- Not a candidate for advanced therapies with her degree of lung disease.   ?- Barostim implant 5/4  ?- Discussed importance of sodium restriction . Changed diet to low sodium  ?  ?2. Acute COPDE ?- lung exam improving  ?- management per primary  ?- on Doxy, prednisone and nebs  ?  ?3. Lactic Acidosis, Resolved   ?- transient hypotension on admit w/ initial LA of 6.2-->1.1 ?- PCT negative, <0.10 ? ?4. Acute on Chronic Hypoxic Respiratory Failure  ?- Multifactorial, former smoker, h/o small cell lung cancer treated with chemo, chest XRT, COPD and HF ?- acute exacerbation 2/2 ACOPDE + a/c CHF  ?- CXR w/o infiltrate. + small pleural effusions. PCT negative  ?- improving  ?- COPD management and diuresis per above  ?  ?5. Recurrent pleural effusions s/p pleurodesis ?- Resolved. Admit CXR ok ?  ?6. PAF ?- CHA2DS2/VASc is at least 4. (CHF, Vasc disease, HTN, Female).   ?- She has history of Afib RVR in the past in the setting of her Lung CA.  ?- Previously on Xarelto but stopped due to hemoptysis.  ?- Started on Eliquis in 12/22 due to renal infarct. ?- Holding Eliquis for Barostim implant  ?  ?7. H/o R renal infarct ?- 12/22 ?- Thought to be cardioembolic ?- holding Eliquis  per above. Resume post procedure  ?   ?8. 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  ?- Chest CT 02/19/18 with mass-like consolidation in R hilum concerning for recurrent tumor.  ?- Repeat CT 03/27/2018 -No definite findings of locally recurrent tumor in the right lung. 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 Continued chest CT surveillance is advised in 3-6 months. ?- S/p thoracentesis 04/2018. Cytology negative for malignancy.  ?- CTA 11/19 stable scarring in the right hilum consistent with emphysema. Bronchoscopy on 11/15, no obvious source of bleeding ?  ?9. Leukocytosis ?- WBC 12>>15>10K. AF. PCT negative ?- suspect 2/2 to prednisone for COPDE  ? ?Length of Stay: 3 ? ?Lyda Jester, PA-C  ?04/12/2022, 11:50 AM ? ?Advanced Heart Failure Team ?Pager [phone] (M-F; 7a - 5p)  ?Please contact [facility] for night-coverage after hours (5p -7a ) and weekends on [email or web address] ? ? ?Patient seen and examined with the above-signed Advanced Practice Provider and/or Housestaff. I personally reviewed laboratory data, imaging studies and relevant notes. I independently examined the patient and formulated the important aspects of the plan. I have edited the note to reflect any of my changes or salient points. I have personally discussed the plan with the patient and/or family. ? ?Remains SOB. No further wheezing. Scr up slightly.  ? ?General:  Thin. No resp difficulty ?HEENT: normal ?Neck: supple. JVP 8-9 Carotids 2+ bilat; no bruits. No lymphadenopathy or thryomegaly appreciated. ?Cor: PMI nondisplaced. Regular rate & rhythm. No rubs, gallops or murmurs. ?Lungs: clear ?Abdomen: soft, nontender, nondistended. No hepatosplenomegaly. No bruits or masses. Good bowel sounds. ?Extremities: no cyanosis, clubbing, rash, edema ?Neuro: alert & orientedx3, cranial nerves grossly intact. moves all 4 extremities w/o difficulty. Affect  pleasant ? ?Still SOB. Agree with another dose of IV lasix. Plan for Barostim tomorrow. Eliquis on hold.  ? ?Glori Bickers, MD  ?5:35 PM ? ? ? ? ? ?

## 2022-04-12 NOTE — Assessment & Plan Note (Signed)
Resolved

## 2022-04-12 NOTE — Progress Notes (Signed)
?  Progress Note ? ? ? ?04/12/2022 ?8:21 AM ?* No surgery date entered * ? ?Acute on Chronic Heart Failure ?Scheduled for Barostim device implant tomorrow 04/13/22 with Dr. Trula Slade ?Patient did not have any questions regarding procedure ?Pre op orders placed ?NPO after midnight ? ? ?Karoline Caldwell, PA-C ?[facility] ?[phone] ?04/12/2022 ?8:21 AM ?

## 2022-04-12 NOTE — Assessment & Plan Note (Addendum)
Ischemic cardiomyopathy, s/p AICD ?Advanced heart failure cardiology team following. ?2D echo 1/23: LVEF 20-25% ?Treated with IV Lasix since 4/30. Clinically appears euvolemic and creatinine also has bumped up, Lasix had been DC'd. ?Continue Farxiga 10 Mg daily, spironolactone 12.5 Mg daily, Corlanor 7.5 Mg twice daily. ?Not candidate for Entresto due to prior hyperkalemia, beta-blockers due to intolerance from low output and COPD with frequent flares or for advanced therapies due to her advanced lung disease. ?S/p Barostim implant by [facility] on 5/4. ?Post Barostim, volume up, cardiology reinitiated IV Lasix 80 mg twice daily.  Diuresed appropriately.  Clinically euvolemic.  Serum creatinine 1.16/stable. ?As per cardiology follow-up, can switch back to home diuretics, torsemide 40 Mg daily and cleared for discharge home.  Has follow-up appointment with them on 5/16.  To be seen with repeat labs.  They will titrate Barostim as outpatient. ?

## 2022-04-12 NOTE — Assessment & Plan Note (Addendum)
Completed a course of doxycycline and prednisone. ?COPD exacerbation resolved. ?Continue prior home as needed albuterol inhaler or DuoNeb's.  ?

## 2022-04-12 NOTE — Plan of Care (Signed)
?  Problem: Education: ?Goal: Ability to demonstrate management of disease process will improve ?Outcome: Progressing ?  ?Problem: Cardiac: ?Goal: Ability to achieve and maintain adequate cardiopulmonary perfusion will improve ?Outcome: Progressing ?  ?Problem: Clinical Measurements: ?Goal: Respiratory complications will improve ?Outcome: Progressing ?  ?Problem: Clinical Measurements: ?Goal: Cardiovascular complication will be avoided ?Outcome: Progressing ?  ?Problem: Coping: ?Goal: Level of anxiety will decrease ?Outcome: Progressing ?  ?Problem: Pain Managment: ?Goal: General experience of comfort will improve ?Outcome: Progressing ?  ?

## 2022-04-12 NOTE — Assessment & Plan Note (Addendum)
Minimize QT prolonging medications. ?QTc 519 ms by EKG 4/30 but patient has BBB morphology and so not sure if this is accurate. ?Has AICD. ?QTc by EKG 5/5: 478 ms as communicated by RN (do not see the EKG in [facility] yet).  This was done to check QTc before ordering some Zofran for nausea. ?

## 2022-04-12 NOTE — Progress Notes (Signed)
?  04/12/22 1100  ?Clinical Encounter Type  ?Visited With Patient  ?Visit Type Follow-up;Pre-op;Spiritual support  ?Referral From Patient  ?Consult/Referral To Chaplain ?Albertina Parr [place])  ?Spiritual Encounters  ?Spiritual Needs Prayer;Emotional  ?Advance Directives (For Healthcare)  ?Does Patient Have a Medical Advance Directive? Yes  ?Type of Paramedic of [place];Living will  ?Copy of [place] in Chart? Yes - validated most recent copy scanned in chart (See row information)  ?Copy of Living Will in Chart? Yes - validated most recent copy scanned in chart (See row information)  ? ?Met with [name] at the patient's bedside. Rexine shared the news of her upcoming surgery and favorable prognosis. Patient feels truly blessed, because surgery had been previously denied by insurance, but has now been approved. At [name]'s request we prayed for successful surgical outcome, quick healing, courage, and strength. Also prayers of thanksgiving for the physician, nurses, and staff caring for her. ?[address] [place], [place] [phone]. ?

## 2022-04-12 NOTE — Assessment & Plan Note (Addendum)
S/p chemotherapy, XRT with prophylactic cranial irradiation ?Reportedly lost to follow-up since 04/2015 ?Needs to reestablish with oncology as outpatient for follow-up.  Will forward discharge summary to Dr. Julien Nordmann. ?

## 2022-04-12 NOTE — Progress Notes (Signed)
?PROGRESS NOTE ?  ?Brittany [name]  OAC:[id number]    DOB: 12/17/1960    DOA: 04/09/2022 ? ?PCP: Scifres, Dorothy, PA-C (Inactive)  ? ?I have briefly reviewed patients previous medical [facility]. ? ?Chief Complaint  ?Patient presents with  ? Respiratory Distress  ? ? 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  Admitted for acute on chronic respiratory failure with hypoxia due to suspected decompensated CHF and COPD exacerbation.  Advanced HF team consulted, diuresed with IV Lasix.  Clinically improved.  Plan for Barostim implant by [facility] on 5/4. ? ?Assessment and Plan: ?* Acute on chronic respiratory failure with hypoxia (HCC) ?Patient confirms that she is on only as needed home oxygen. ?Required CPAP by EMS on arrival. ?Multifactorial due to decompensated CHF and COPD exacerbation, treated as below. ?Currently saturating at 96% on 2-3 L/min, wean oxygen as tolerated for saturations >92%. ?Reassess home oxygen requirement prior to discharge. ? ?Acute on chronic systolic CHF (congestive heart failure) ([place]) ?Ischemic cardiomyopathy, s/p AICD ?Advanced heart failure cardiology team following. ?2D echo 1/23: LVEF 20-25% ?Treated with IV Lasix since 4/30, -3.37 L thus far and weight down by about 4 pounds. ?Clinically appears euvolemic and creatinine also has bumped up, Lasix discontinued by cardiology. ?Continue Farxiga 10 Mg daily, spironolactone 12.5 Mg daily, Corlanor 7.5 Mg twice daily. ?Not candidate for Entresto due to prior hyperkalemia, beta-blockers due to intolerance  from low output and COPD with frequent flares or for advanced therapies due to her advanced lung disease. ?Barostim implant by [facility] on 5/4. ? ?COPD exacerbation ([place]) ?Continue DuoNebs and prednisone ?Complete course of doxycycline ?Flutter valve. ?Improving. ? ?Hypotension ?Transient on admission. ?Procalcitonin negative. ?Lactate improved from 6.2-1.1. ?Antibiotics narrowed from ceftriaxone to doxycycline. ?Sepsis ruled out. ? ?AF (paroxysmal atrial fibrillation) ([place]) ?CHA2DS2-VASc score at least 4 ?Previously on Xarelto, stopped due to hemoptysis ?On Eliquis PTA, started in 12/22 due to renal infarct, suspected embolic ?Holding Eliquis for Barostim implant 5/3. ? ?Type 2 diabetes mellitus with stage 3a chronic kidney disease, without long-term current use of insulin ([place]) ?A1c 5.9 on 04/09/2022 ?Labile CBGs but reasonable ?Continue SSI. ? ?Small cell lung cancer ([place]) ?S/p chemotherapy, XRT with prophylactic cranial irradiation ?Reportedly lost to follow-up since 04/2015 ?Needs to reestablish with oncology as outpatient for follow-up. ? ?Recurrent pleural effusion on right ?Resolved. ? ?Lactic acidosis ?Lactate peaked to 6.2. ?May be related to respiratory distress and decompensated CHF.  Sepsis ruled out. ?PCT negative. ?Resolved. ? ?Prolonged QT interval ?Minimize QT prolonging medications. ?QTc 519 ms by EKG 4/30 but patient has BBB morphology and so not sure if this is accurate. ?Has AICD. ? ? ?Body mass index is 19.87 kg/m?Marland Kitchen ?Nutritional Status ?  ?  ?  ?Pressure Ulcer: ?  ? ?DVT prophylaxis:   Eliquis on hold for procedure.  SCDs added. ?  Code Status: Full Code:  ?Family Communication: None at bedside ?Disposition:  ?Status is: Inpatient ?Remains inpatient appropriate because: Severity of illness, need for IV Lasix and upcoming procedure tomorrow. ?  ? ?Consultants:   ?Cardiology ?Vascular surgery ? ?Procedures:   ?None ? ?Antimicrobials:   ?  As noted above ? ? ?Subjective:  ?States that her dyspnea is "100%  better" compared to on admission but at the same time indicates that her breathing is not yet to baseline.  She states that she still has "fluid in her lungs".  No chest pain. ? ?Objective:  ? ?Vitals:  ? 04/12/22 0055 04/12/22 0559 04/12/22 0700 04/12/22 0723  ?BP: 106/62 103/62 116/85 116/85  ?Pulse: 67 60 72 75  ?Resp: 14 15 16 16   ?Temp: 98.1 ?F (36.7 ?C) 98 ?F (36.7 ?C) 98.5 ?F (36.9 ?C) 98.5 ?F (36.9 ?C)  ?TempSrc: Oral Oral Oral Oral  ?SpO2: 100% 99% 96% 96%  ?Weight:  44.6 kg    ?Height:      ? ? ?General exam: Middle-age female, small built and frail lying comfortably propped up in bed without distress. ?Respiratory system: Occasional by basal crackles but otherwise clear to auscultation.  No increased work of breathing. ?Cardiovascular system: S1 & S2 heard, RRR. No JVD, murmurs, rubs, gallops or clicks. No pedal edema. ?Gastrointestinal system: Abdomen is nondistended, soft and nontender. No organomegaly or masses felt. Normal bowel sounds heard. ?Central nervous system: Alert and oriented. No focal neurological deficits. ?Extremities: Symmetric 5 x 5 power. ?Skin: No rashes, lesions or ulcers ?Psychiatry: Judgement and insight appear normal. Mood & affect appropriate.  ? ?Data Reviewed:   ?I have personally reviewed following labs and imaging studies ? ?CBC: ?Recent Labs  ?Lab 04/09/22 ?0700 04/10/22 ?0014 04/10/22 ?1135 04/11/22 ?0850 04/12/22 ?0252  ?WBC 12.8*   < > 15.9* 13.3* 10.6*  ?NEUTROABS 9.8*  --   --   --   --   ?HGB 15.5*   < > 13.7 14.1 14.6  ?HCT 48.4*   < > 43.3 44.4 45.6  ?MCV 98.8   < > 96.9 96.7 95.2  ?PLT 173   < > 131* 142* 170  ? < > = values in this interval not displayed.  ? ?Basic Metabolic Panel: ?Recent Labs  ?Lab 04/09/22 ?0700 04/10/22 ?0014 04/10/22 ?1135 04/11/22 ?7371 04/11/22 ?0626 04/12/22 ?0252  ?NA 138 137 138  --  138 137  ?K 3.4* 4.2 4.3  --  4.1 3.9  ?CL 106 103 105  --  104 103  ?CO2 21* 24 25  --  24 25  ?GLUCOSE 278* 121* 123*  --  121* 95  ?BUN 21 30* 29*  --   38* 41*  ?CREATININE 1.20* 1.17* 1.17*  --  1.29* 1.38*  ?CALCIUM 9.5 9.4 9.8  --  9.8 9.7  ?MG  --  2.2  --  2.3  --  2.1  ? ?Liver Function Tests: ?Recent Labs  ?Lab 04/10/22 ?1135 04/11/22 ?0850 04/12/22 ?0252  ?AST 24 26 22   ?ALT 18 20 24   ?ALKPHOS 64 66 59  ?BILITOT 0.6 0.7 0.8  ?PROT 6.9 6.5 6.6  ?ALBUMIN 3.7 3.7 3.6  ? ?CBG: ?Recent Labs  ?Lab 04/11/22 ?2056 04/12/22 ?0735 04/12/22 ?1138  ?GLUCAP 178* 96 187*  ? ?Microbiology Studies:  ? ?Recent Results (from the past 240 hour(s))  ?Resp Panel by RT-PCR (Flu A&B, Covid) Nasopharyngeal Swab     Status: None  ? Collection Time: 04/09/22  7:12 AM  ? Specimen: Nasopharyngeal Swab; Nasopharyngeal(NP) swabs in vial transport medium  ?Result Value Ref Range Status  ? SARS Coronavirus 2 by RT PCR NEGATIVE NEGATIVE Final  ?  Comment: (NOTE) ?SARS-CoV-2 target nucleic acids are NOT DETECTED. ? ?The SARS-CoV-2 RNA is generally detectable in upper respiratory ?specimens  during the acute phase of infection. The lowest ?concentration of SARS-CoV-2 viral copies this assay can detect is ?138 copies/mL. A negative result does not preclude SARS-Cov-2 ?infection and should not be used as the sole basis for treatment or ?other patient management decisions. A negative result may occur with  ?improper specimen collection/handling, submission of specimen other ?than nasopharyngeal swab, presence of viral mutation(s) within the ?areas targeted by this assay, and inadequate number of viral ?copies(<138 copies/mL). A negative result must be combined with ?clinical observations, patient history, and epidemiological ?information. The expected result is Negative. ? ?Fact Sheet for Patients:  ?EntrepreneurPulse.com.au ? ?Fact Sheet for Healthcare Providers:  ?IncredibleEmployment.be ? ?This test is no t yet approved or cleared by the [place] FDA and  ?has been authorized for detection and/or diagnosis of SARS-CoV-2 by ?FDA under an Emergency Use  Authorization (EUA). This EUA will remain  ?in effect (meaning this test can be used) for the duration of the ?COVID-19 declaration under Section 564(b)(1) of the Act, 21 ?U.S.C.section 360bbb-3(b)(1), unle

## 2022-04-12 NOTE — Assessment & Plan Note (Addendum)
CHA2DS2-VASc score at least 4 ?Previously on Xarelto, stopped due to hemoptysis ?On Eliquis PTA, started in 12/22 due to renal infarct, suspected embolic ?Eliquis was held for [facility] implant 5/3.  Resumed Eliquis 5/5 PM after confirming with Dr. Trula Slade.  Continue Eliquis. ?

## 2022-04-12 NOTE — Assessment & Plan Note (Addendum)
Patient confirms that she is on only as needed home oxygen. ?Required CPAP by EMS on arrival. ?Multifactorial due to decompensated CHF and COPD exacerbation, treated as below. ?Hypoxia resolved.  Saturating in the high 90s on room air. ?Reassess home oxygen requirement prior to discharge. ?

## 2022-04-12 NOTE — Assessment & Plan Note (Signed)
Transient on admission. ?Procalcitonin negative. ?Lactate improved from 6.2-1.1. ?Antibiotics narrowed from ceftriaxone to doxycycline. ?Sepsis ruled out. ?

## 2022-04-13 ENCOUNTER — Other Ambulatory Visit: Payer: Self-pay

## 2022-04-13 ENCOUNTER — Inpatient Hospital Stay (HOSPITAL_COMMUNITY): Payer: BC Managed Care – PPO | Admitting: Certified Registered Nurse Anesthetist

## 2022-04-13 ENCOUNTER — Encounter (HOSPITAL_COMMUNITY): Payer: Self-pay | Admitting: Internal Medicine

## 2022-04-13 ENCOUNTER — Encounter (HOSPITAL_COMMUNITY)
Admission: EM | Disposition: A | Discharge status: Home / Self Care | Payer: Self-pay | Source: Home / Self Care | Attending: Internal Medicine

## 2022-04-13 DIAGNOSIS — J9621 Acute and chronic respiratory failure with hypoxia: Secondary | ICD-10-CM | POA: Diagnosis not present

## 2022-04-13 LAB — CBC
HCT: 46.8 % — ABNORMAL HIGH (ref 36.0–46.0)
Hemoglobin: 15 g/dL (ref 12.0–15.0)
MCH: 30.4 pg (ref 26.0–34.0)
MCHC: 32.1 g/dL (ref 30.0–36.0)
MCV: 94.7 fL (ref 80.0–100.0)
Platelets: 189 10*3/uL (ref 150–400)
RBC: 4.94 MIL/uL (ref 3.87–5.11)
RDW: 19.2 % — ABNORMAL HIGH (ref 11.5–15.5)
WBC: 10 10*3/uL (ref 4.0–10.5)
nRBC: 0 % (ref 0.0–0.2)

## 2022-04-13 LAB — BASIC METABOLIC PANEL
Anion gap: 10 (ref 5–15)
BUN: 46 mg/dL — ABNORMAL HIGH (ref 8–23)
CO2: 23 mmol/L (ref 22–32)
Calcium: 9.6 mg/dL (ref 8.9–10.3)
Chloride: 103 mmol/L (ref 98–111)
Creatinine, Ser: 1.09 mg/dL — ABNORMAL HIGH (ref 0.44–1.00)
GFR, Estimated: 58 mL/min — ABNORMAL LOW (ref >60)
Glucose, Bld: 90 mg/dL (ref 70–99)
Potassium: 4.2 mmol/L (ref 3.5–5.1)
Sodium: 136 mmol/L (ref 135–145)

## 2022-04-13 LAB — GLUCOSE, CAPILLARY
Glucose-Capillary: 102 mg/dL — ABNORMAL HIGH (ref 70–99)
Glucose-Capillary: 143 mg/dL — ABNORMAL HIGH (ref 70–99)
Glucose-Capillary: 167 mg/dL — ABNORMAL HIGH (ref 70–99)
Glucose-Capillary: 190 mg/dL — ABNORMAL HIGH (ref 70–99)

## 2022-04-13 SURGERY — INSERTION, CAROTID SINUS BAROREFLEX ACTIVATION DEVICE
Anesthesia: General | Site: Neck

## 2022-04-13 MED ORDER — BUPIVACAINE HCL (PF) 0.5 % IJ SOLN
INTRAMUSCULAR | Status: AC
  Filled 2022-04-13: qty 30

## 2022-04-13 MED ORDER — LACTATED RINGERS IV SOLN: INTRAVENOUS | Status: DC | PRN

## 2022-04-13 MED ORDER — HYDROMORPHONE HCL 1 MG/ML IJ SOLN
0.2500 mg | INTRAMUSCULAR | Status: DC | PRN
  Administered 2022-04-13 (×2): 0.25 mg via INTRAVENOUS
  Administered 2022-04-13: 0.5 mg via INTRAVENOUS

## 2022-04-13 MED ORDER — PHENYLEPHRINE HCL-NACL 20-0.9 MG/250ML-% IV SOLN
INTRAVENOUS | Status: DC | PRN
  Administered 2022-04-13: 25 ug/min via INTRAVENOUS

## 2022-04-13 MED ORDER — CHLORHEXIDINE GLUCONATE CLOTH 2 % EX PADS
6.0000 | MEDICATED_PAD | Freq: Once | CUTANEOUS | Status: AC
  Administered 2022-04-13: 6 via TOPICAL

## 2022-04-13 MED ORDER — MIDAZOLAM HCL 5 MG/5ML IJ SOLN
INTRAMUSCULAR | Status: DC | PRN
  Administered 2022-04-13 (×4): 1 mg via INTRAVENOUS
  Administered 2022-04-13: 2 mg via INTRAVENOUS

## 2022-04-13 MED ORDER — 0.9 % SODIUM CHLORIDE (POUR BTL) OPTIME
TOPICAL | Status: DC | PRN
  Administered 2022-04-13: 1000 mL

## 2022-04-13 MED ORDER — PROPOFOL 10 MG/ML IV BOLUS
INTRAVENOUS | Status: DC | PRN
  Administered 2022-04-13: 70 mg via INTRAVENOUS

## 2022-04-13 MED ORDER — CHLORHEXIDINE GLUCONATE 0.12 % MT SOLN: 15.0000 mL | Freq: Once | OROMUCOSAL | Status: AC

## 2022-04-13 MED ORDER — FENTANYL CITRATE (PF) 250 MCG/5ML IJ SOLN
INTRAMUSCULAR | Status: DC | PRN
  Administered 2022-04-13 (×2): 50 ug via INTRAVENOUS

## 2022-04-13 MED ORDER — ORAL CARE MOUTH RINSE: 15.0000 mL | Freq: Once | OROMUCOSAL | Status: AC

## 2022-04-13 MED ORDER — LABETALOL HCL 5 MG/ML IV SOLN: 10.0000 mg | INTRAVENOUS | Status: DC | PRN

## 2022-04-13 MED ORDER — OXYCODONE HCL 5 MG/5ML PO SOLN: 5.0000 mg | Freq: Once | ORAL | Status: DC | PRN

## 2022-04-13 MED ORDER — MIDAZOLAM HCL 2 MG/2ML IJ SOLN
INTRAMUSCULAR | Status: AC
  Filled 2022-04-13: qty 2

## 2022-04-13 MED ORDER — DEXAMETHASONE SODIUM PHOSPHATE 10 MG/ML IJ SOLN
INTRAMUSCULAR | Status: DC | PRN
  Administered 2022-04-13: 5 mg via INTRAVENOUS

## 2022-04-13 MED ORDER — ROCURONIUM BROMIDE 10 MG/ML (PF) SYRINGE
PREFILLED_SYRINGE | INTRAVENOUS | Status: DC | PRN
  Administered 2022-04-13: 50 mg via INTRAVENOUS
  Administered 2022-04-13: 10 mg via INTRAVENOUS
  Administered 2022-04-13: 20 mg via INTRAVENOUS

## 2022-04-13 MED ORDER — PHENYLEPHRINE 80 MCG/ML (10ML) SYRINGE FOR IV PUSH (FOR BLOOD PRESSURE SUPPORT)
PREFILLED_SYRINGE | INTRAVENOUS | Status: DC | PRN
  Administered 2022-04-13 (×3): 160 ug via INTRAVENOUS

## 2022-04-13 MED ORDER — LIDOCAINE 2% (20 MG/ML) 5 ML SYRINGE
INTRAMUSCULAR | Status: AC
  Filled 2022-04-13: qty 5

## 2022-04-13 MED ORDER — DEXAMETHASONE SODIUM PHOSPHATE 10 MG/ML IJ SOLN
INTRAMUSCULAR | Status: AC
  Filled 2022-04-13: qty 2

## 2022-04-13 MED ORDER — ALBUMIN HUMAN 5 % IV SOLN: INTRAVENOUS | Status: DC | PRN

## 2022-04-13 MED ORDER — AMISULPRIDE (ANTIEMETIC) 5 MG/2ML IV SOLN: 10.0000 mg | Freq: Once | INTRAVENOUS | Status: DC | PRN

## 2022-04-13 MED ORDER — MIDAZOLAM HCL 2 MG/2ML IJ SOLN
INTRAMUSCULAR | Status: AC
Start: 2022-04-13 — End: ?
  Filled 2022-04-13: qty 2

## 2022-04-13 MED ORDER — LACTATED RINGERS IV SOLN: INTRAVENOUS | Status: DC

## 2022-04-13 MED ORDER — HYDRALAZINE HCL 20 MG/ML IJ SOLN: 5.0000 mg | INTRAMUSCULAR | Status: DC | PRN

## 2022-04-13 MED ORDER — OXYCODONE HCL 5 MG PO TABS: 5.0000 mg | ORAL_TABLET | Freq: Once | ORAL | Status: DC | PRN

## 2022-04-13 MED ORDER — ROCURONIUM BROMIDE 10 MG/ML (PF) SYRINGE
PREFILLED_SYRINGE | INTRAVENOUS | Status: AC
  Filled 2022-04-13: qty 10

## 2022-04-13 MED ORDER — VASOPRESSIN 20 UNIT/ML IV SOLN
INTRAVENOUS | Status: AC
  Filled 2022-04-13: qty 1

## 2022-04-13 MED ORDER — FENTANYL CITRATE (PF) 250 MCG/5ML IJ SOLN
INTRAMUSCULAR | Status: AC
  Filled 2022-04-13: qty 5

## 2022-04-13 MED ORDER — PHENYLEPHRINE 80 MCG/ML (10ML) SYRINGE FOR IV PUSH (FOR BLOOD PRESSURE SUPPORT)
PREFILLED_SYRINGE | INTRAVENOUS | Status: AC
  Filled 2022-04-13: qty 10

## 2022-04-13 MED ORDER — VASOPRESSIN 20 UNIT/ML IV SOLN
INTRAVENOUS | Status: DC | PRN
  Administered 2022-04-13 (×5): 2 [IU] via INTRAVENOUS

## 2022-04-13 MED ORDER — SURGIFLO WITH THROMBIN (HEMOSTATIC MATRIX KIT) OPTIME
TOPICAL | Status: DC | PRN
  Administered 2022-04-13: 1 via TOPICAL

## 2022-04-13 MED ORDER — CHLORHEXIDINE GLUCONATE 0.12 % MT SOLN
OROMUCOSAL | Status: AC
  Administered 2022-04-13: 15 mL via OROMUCOSAL
  Filled 2022-04-13: qty 15

## 2022-04-13 MED ORDER — LIDOCAINE 2% (20 MG/ML) 5 ML SYRINGE
INTRAMUSCULAR | Status: DC | PRN
  Administered 2022-04-13: 40 mg via INTRAVENOUS

## 2022-04-13 MED ORDER — PANTOPRAZOLE SODIUM 40 MG PO TBEC
40.0000 mg | DELAYED_RELEASE_TABLET | Freq: Every day | ORAL | Status: DC
  Administered 2022-04-13 – 2022-04-16 (×4): 40 mg via ORAL
  Filled 2022-04-13 (×4): qty 1

## 2022-04-13 MED ORDER — HYDROMORPHONE HCL 1 MG/ML IJ SOLN
INTRAMUSCULAR | Status: AC
  Filled 2022-04-13: qty 1

## 2022-04-13 MED ORDER — SODIUM CHLORIDE 0.9 % IV SOLN
INTRAVENOUS | Status: DC | PRN
  Administered 2022-04-13: 500 mL

## 2022-04-13 MED ORDER — ONDANSETRON HCL 4 MG/2ML IJ SOLN
INTRAMUSCULAR | Status: AC
  Filled 2022-04-13: qty 4

## 2022-04-13 MED ORDER — CEFAZOLIN SODIUM-DEXTROSE 2-4 GM/100ML-% IV SOLN
2.0000 g | Freq: Three times a day (TID) | INTRAVENOUS | Status: AC
  Administered 2022-04-13 (×2): 2 g via INTRAVENOUS
  Filled 2022-04-13 (×2): qty 100

## 2022-04-13 MED ORDER — SUGAMMADEX SODIUM 200 MG/2ML IV SOLN
INTRAVENOUS | Status: DC | PRN
  Administered 2022-04-13 (×2): 100 mg via INTRAVENOUS

## 2022-04-13 MED ORDER — ONDANSETRON HCL 4 MG/2ML IJ SOLN
INTRAMUSCULAR | Status: DC | PRN
  Administered 2022-04-13: 4 mg via INTRAVENOUS

## 2022-04-13 MED ORDER — SODIUM CHLORIDE 0.9 % IV SOLN
0.1500 ug/kg/min | INTRAVENOUS | Status: AC
  Administered 2022-04-13: .25 ug/kg/min via INTRAVENOUS
  Filled 2022-04-13: qty 2000

## 2022-04-13 MED ORDER — LIDOCAINE HCL (PF) 1 % IJ SOLN
INTRAMUSCULAR | Status: AC
  Filled 2022-04-13: qty 30

## 2022-04-13 MED ORDER — OXYCODONE-ACETAMINOPHEN 5-325 MG PO TABS
1.0000 | ORAL_TABLET | ORAL | Status: DC | PRN
  Administered 2022-04-13 – 2022-04-14 (×4): 2 via ORAL
  Administered 2022-04-15: 1 via ORAL
  Filled 2022-04-13 (×2): qty 2
  Filled 2022-04-13: qty 1
  Filled 2022-04-13 (×3): qty 2

## 2022-04-13 SURGICAL SUPPLY — 43 items
ADH SKN CLS APL DERMABOND .7 (GAUZE/BANDAGES/DRESSINGS) ×1
AGENT HMST KT MTR STRL THRMB (HEMOSTASIS) ×1
APL PRP STRL LF DISP 70% ISPRP (MISCELLANEOUS) ×1
BAG COUNTER SPONGE SURGICOUNT (BAG) ×2 IMPLANT
BAG SPNG CNTER NS LX DISP (BAG) ×1
CANISTER SUCT 3000ML PPV (MISCELLANEOUS) ×2 IMPLANT
CHLORAPREP W/TINT 26 (MISCELLANEOUS) ×2 IMPLANT
CLIP TI WIDE RED SMALL 6 (CLIP) ×1 IMPLANT
DERMABOND ADVANCED (GAUZE/BANDAGES/DRESSINGS) ×1
DERMABOND ADVANCED .7 DNX12 (GAUZE/BANDAGES/DRESSINGS) ×1 IMPLANT
ELECT REM PT RETURN 9FT ADLT (ELECTROSURGICAL) ×2
ELECTRODE REM PT RTRN 9FT ADLT (ELECTROSURGICAL) ×1 IMPLANT
GENERATOR IPG BAROSTIM 2104 (Generator) ×1 IMPLANT
GLOVE SURG SS PI 7.5 STRL IVOR (GLOVE) ×6 IMPLANT
GOWN STRL REUS W/ TWL LRG LVL3 (GOWN DISPOSABLE) ×3 IMPLANT
GOWN STRL REUS W/ TWL XL LVL3 (GOWN DISPOSABLE) ×1 IMPLANT
GOWN STRL REUS W/TWL LRG LVL3 (GOWN DISPOSABLE) ×6
GOWN STRL REUS W/TWL XL LVL3 (GOWN DISPOSABLE) ×2
HEMOSTAT SNOW SURGICEL 2X4 (HEMOSTASIS) IMPLANT
KIT BASIN OR (CUSTOM PROCEDURE TRAY) ×2 IMPLANT
KIT TURNOVER KIT B (KITS) ×2 IMPLANT
LEAD CAROTID BAROSTIM 1036 (Lead) ×1 IMPLANT
MARKER SKIN DUAL TIP RULER LAB (MISCELLANEOUS) ×2 IMPLANT
NDL 18GX1X1/2 (RX/OR ONLY) (NEEDLE) ×1 IMPLANT
NEEDLE 18GX1X1/2 (RX/OR ONLY) (NEEDLE) ×2 IMPLANT
NS IRRIG 1000ML POUR BTL (IV SOLUTION) ×4 IMPLANT
PACK CAROTID (CUSTOM PROCEDURE TRAY) ×2 IMPLANT
PAD ARMBOARD 7.5X6 YLW CONV (MISCELLANEOUS) ×4 IMPLANT
POSITIONER HEAD DONUT 9IN (MISCELLANEOUS) ×2 IMPLANT
SPONGE T-LAP 12X12 ~~LOC~~+RFID (SPONGE) ×1 IMPLANT
SURGIFLO W/THROMBIN 8M KIT (HEMOSTASIS) ×1 IMPLANT
SUT ETHIBOND CT1 BRD #0 30IN (SUTURE) ×4 IMPLANT
SUT ETHILON 3 0 PS 1 (SUTURE) IMPLANT
SUT PROLENE 5 0 C 1 24 (SUTURE) ×1 IMPLANT
SUT PROLENE 6 0 BV (SUTURE) ×17 IMPLANT
SUT SILK 0 FSL (SUTURE) IMPLANT
SUT VIC AB 3-0 SH 27 (SUTURE) ×6
SUT VIC AB 3-0 SH 27X BRD (SUTURE) ×2 IMPLANT
SUT VICRYL 4-0 PS2 18IN ABS (SUTURE) ×4 IMPLANT
SYR 5ML LL (SYRINGE) ×2 IMPLANT
SYR BULB IRRIG 60ML STRL (SYRINGE) ×2 IMPLANT
TOWEL GREEN STERILE (TOWEL DISPOSABLE) ×2 IMPLANT
WATER STERILE IRR 1000ML POUR (IV SOLUTION) ×2 IMPLANT

## 2022-04-13 NOTE — Progress Notes (Addendum)
? ? Advanced Heart Failure Rounding Note ? ?PCP-Cardiologist: None  ? ?Subjective:   ?4/30 Midodrine stopped  ?5/1 Diuresed with IV lasix. Weight down 102-->101  ? ?Just returned from OR after Barotstim implant. A little groggy. Complaining of mod post operative pain. No dyspnea. VSS.  ? ?Scr improved w/ diuresis, 1.38>>1.38  ? ? ? ?Objective:   ?Weight Range: ?44.9 kg ?Body mass index is 20 kg/m?.  ? ?Vital Signs:   ?Temp:  [97.5 ?F (36.4 ?C)-98.2 ?F (36.8 ?C)] 97.8 ?F (36.6 ?C) (05/04 1310) ?Pulse Rate:  [61-84] 71 (05/04 1310) ?Resp:  [10-20] 13 (05/04 1310) ?BP: (104-121)/(52-71) 108/63 (05/04 1310) ?SpO2:  [94 %-97 %] 95 % (05/04 1310) ?Weight:  [44.9 kg] 44.9 kg (05/04 0428) ?Last BM Date : 04/11/22 ? ?Weight change: ?Filed Weights  ? 04/11/22 0322 04/12/22 0559 04/13/22 0428  ?Weight: 46 kg 44.6 kg 44.9 kg  ? ? ?Intake/Output:  ? ?Intake/Output Summary (Last 24 hours) at 04/13/2022 1456 ?Last data filed at 04/13/2022 1211 ?Gross per 24 hour  ?Intake 1600 ml  ?Output 950 ml  ?Net 650 ml  ?  ? ? ?Physical Exam  ?  ?General:  awake but drowsy appearing post anesthesia .No respiratory difficulty ?HEENT: normal ?Neck: supple. JVD not well visualized. + rt neck surgical incisions w/o drainage, Carotids 2+ bilat; no bruits. No lymphadenopathy or thyromegaly appreciated. ?Cor: PMI nondisplaced. Regular rate & rhythm. No rubs, gallops or murmurs. ?Lungs: clear ?Abdomen: soft, nontender, nondistended. No hepatosplenomegaly. No bruits or masses. Good bowel sounds. ?Extremities: no cyanosis, clubbing, rash, edema + b/l SCDs  ?Neuro: alert & oriented x 3, cranial nerves grossly intact. moves all 4 extremities w/o difficulty. Affect pleasant. ? ?Telemetry  ? ?NSR 70s  ? ?EKG  ?  ?N/A  ? ?Labs  ?  ?CBC ?Recent Labs  ?  04/12/22 ?0252 04/13/22 ?0352  ?WBC 10.6* 10.0  ?HGB 14.6 15.0  ?HCT 45.6 46.8*  ?MCV 95.2 94.7  ?PLT 170 189  ? ?Basic Metabolic Panel ?Recent Labs  ?  04/11/22 ?0323 04/11/22 ?0850 04/12/22 ?0252  04/13/22 ?0352  ?NA  --    < > 137 136  ?K  --    < > 3.9 4.2  ?CL  --    < > 103 103  ?CO2  --    < > 25 23  ?GLUCOSE  --    < > 95 90  ?BUN  --    < > 41* 46*  ?CREATININE  --    < > 1.38* 1.09*  ?CALCIUM  --    < > 9.7 9.6  ?MG 2.3  --  2.1  --   ? < > = values in this interval not displayed.  ? ?Liver Function Tests ?Recent Labs  ?  04/11/22 ?0850 04/12/22 ?0252  ?AST 26 22  ?ALT 20 24  ?ALKPHOS 66 59  ?BILITOT 0.7 0.8  ?PROT 6.5 6.6  ?ALBUMIN 3.7 3.6  ? ?No results for input(s): LIPASE, AMYLASE in the last 72 hours. ?Cardiac Enzymes ?No results for input(s): CKTOTAL, CKMB, CKMBINDEX, TROPONINI in the last 72 hours. ? ?BNP: ?BNP (last 3 results) ?Recent Labs  ?  03/21/22 ?1400 03/24/22 ?1227 04/09/22 ?0700  ?BNP 1,514.7* 650.7* 2,367.1*  ? ? ?ProBNP (last 3 results) ?Recent Labs  ?  07/19/21 ?0945  ?PROBNP 3,162*  ? ? ? ?D-Dimer ?No results for input(s): DDIMER in the last 72 hours. ? ?Hemoglobin A1C ?No results for input(s): HGBA1C in the last 72  hours. ? ?Fasting Lipid Panel ?No results for input(s): CHOL, HDL, LDLCALC, TRIG, CHOLHDL, LDLDIRECT in the last 72 hours. ?Thyroid Function Tests ?No results for input(s): TSH, T4TOTAL, T3FREE, THYROIDAB in the last 72 hours. ? ?Invalid input(s): FREET3 ? ?Other results: ? ? ?Imaging  ? ? ?No results found. ? ? ?Medications:   ? ? ?Scheduled Medications: ? atorvastatin  80 mg Oral q1800  ? dapagliflozin propanediol  10 mg Oral Daily  ? doxycycline  100 mg Oral Q12H  ? HYDROmorphone      ? insulin aspart  0-5 Units Subcutaneous QHS  ? insulin aspart  0-9 Units Subcutaneous TID WC  ? ivabradine  7.5 mg Oral BID WC  ? pantoprazole  40 mg Oral Daily  ? predniSONE  40 mg Oral Q breakfast  ? sodium chloride flush  3 mL Intravenous Q12H  ? spironolactone  12.5 mg Oral Daily  ? ? ?Infusions: ?  ceFAZolin (ANCEF) IV 2 g (04/13/22 1442)  ? ? ?PRN Medications: ?acetaminophen **OR** acetaminophen, albuterol, benzonatate, guaiFENesin-dextromethorphan, hydrALAZINE, labetalol,  oxyCODONE-acetaminophen ? ? ? ?Patient Profile  ?Brittany [name] is a 61 y.o. 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 ? ?Admitted with HF + COPD ? ? ?Assessment/Plan  ?Acute on Chronic Systolic Heart Failure Due to ICM  ?- Echo 09/04/18 ([facility]) LVEF 20%, Moderate AI, Mild MR, Mild TR, Severe LAE, RV mildly decreased.  ?- Echo 02/12/19: EF 25-30%, RV mildly reduced ?- Echo 11/21 EF 20-25% RV mild HK. ?- Echo 2022 EF < 20% RV normal ?- Echo 1/23 EF 20-25% RV ok  ?- Has [employer] single chamber ICD  ?- Volume status improved. Switch back to torsemide tomorrow, may need to alternate dosing 40-60 mg qod  ?- Failed Entresto due to hyperkalemia   ?- Continue Farxiga 10 mg daily  ?- Continue spiro 12.5 mg daily ?- Continue corlanor 7.5 mg bid. ?- No b-blocker due to intolerance with low output and bad COPD with frequent flares ?- Not a candidate for advanced therapies with her degree of lung disease.   ?- S/p Barostim implant 5/4  ?- Discussed importance of sodium restriction . Changed diet to low sodium  ?  ?2. Acute COPDE ?- lung exam improving  ?- management per primary  ?- on Doxy, prednisone and nebs  ?  ?3. Lactic Acidosis, Resolved   ?- transient hypotension on admit w/ initial LA of 6.2-->1.1 ?- PCT negative, <0.10 ? ?4. Acute on Chronic Hypoxic Respiratory Failure  ?- Multifactorial, former smoker, h/o small cell lung cancer treated with chemo, chest XRT, COPD and HF ?- acute exacerbation 2/2 ACOPDE + a/c CHF  ?- CXR w/o infiltrate. + small pleural effusions. PCT negative  ?- improving  ?- COPD management and diuresis per above  ?  ?5. Recurrent pleural effusions s/p pleurodesis ?- Resolved. Admit CXR ok ?  ?6. PAF ?- CHA2DS2/VASc is at least 4. (CHF, Vasc disease, HTN, Female).   ?- She has history of Afib RVR in the past in the  setting of her Lung CA.  ?- Previously on Xarelto but stopped due to hemoptysis.  ?- Started on Eliquis in 12/22 due to renal infarct. ?- Holding Eliquis for Barostim implant  ?  ?7. H/o R renal infarct ?- 12/22 ?- Thought to be cardioembolic ?- holding Eliquis per above.  Resume once cleared by [facility]  ?   ?8. 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  ?- Chest CT 02/19/18 with mass-like consolidation in R hilum concerning for recurrent tumor.  ?- Repeat CT 03/27/2018 -No definite findings of locally recurrent tumor in the right lung. 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 Continued chest CT surveillance is advised in 3-6 months. ?- S/p thoracentesis 04/2018. Cytology negative for malignancy.  ?- CTA 11/19 stable scarring in the right hilum consistent with emphysema. Bronchoscopy on 11/15, no obvious source of bleeding ?  ?9. Leukocytosis ?- WBC 12>>15>10K. AF. PCT negative ?- suspect 2/2 to prednisone for COPDE  ? ?10. S/p Barostim Implant ?- post operative management per VVS ?- appreciate Dr. Stephens Shire assistance  ? ?Length of Stay: 4 ? ?Lyda Jester, PA-C  ?04/13/2022, 2:56 PM ? ?Advanced Heart Failure Team ?Pager [phone] (M-F; 7a - 5p)  ?Please contact [facility] for night-coverage after hours (5p -7a ) and weekends on [email or web address] ? ? ?Patient seen and examined with the above-signed Advanced Practice Provider and/or Housestaff. I personally reviewed laboratory data, imaging studies and relevant notes. I independently examined the patient and formulated the important aspects of the plan. I have edited the note to reflect any of my changes or salient points. I have personally discussed the plan with the patient and/or family. ? ?S/p Barostim implant today. Remains sore. Denies dyspnea, orthopnea or PND/ ? ?General:  Sitting up in bed . No resp difficulty ?HEENT: normal ?Neck: supple. Righ neck and face swollen around  wound ?Cor: PMI nondisplaced. Regular rate & rhythm. No rubs, gallops or murmurs. ?Lungs: clear ?Abdomen: soft, nontender, nondistended. No hepatosplenomegaly. No bruits or masses. Good bowel sounds. ?Extremities: no cyano

## 2022-04-13 NOTE — Transfer of Care (Signed)
Immediate Anesthesia Transfer of Care Note ? ?Patient: Aliyanah Rozas ? ?Procedure(s) Performed: INSERTION OF BAROSTIM (Neck) ? ?Patient Location: PACU ? ?Anesthesia Type:General ? ?Level of Consciousness: oriented, drowsy, patient cooperative and responds to stimulation ? ?Airway & Oxygen Therapy: Patient Spontanous Breathing and Patient connected to nasal cannula oxygen ? ?Post-op Assessment: Report given to RN, Post -op Vital signs reviewed and stable and Patient moving all extremities X 4 ? ?Post vital signs: Reviewed and stable ? ?Last Vitals:  ?Vitals Value Taken Time  ?BP 112/70 04/13/22 1210  ?Temp    ?Pulse 82 04/13/22 1213  ?Resp 19 04/13/22 1213  ?SpO2 99 % 04/13/22 1213  ?Vitals shown include unvalidated device data. ? ?Last Pain:  ?Vitals:  ? 04/13/22 0833  ?TempSrc:   ?PainSc: 0-No pain  ?   ? ?Patients Stated Pain Goal: 0 (04/12/22 0723) ? ?Complications: No notable events documented. ?

## 2022-04-13 NOTE — Anesthesia Procedure Notes (Signed)
Procedure Name: Intubation ?Date/Time: 04/13/2022 9:50 AM ?Performed by: Colin Benton, CRNA ?Pre-anesthesia Checklist: Patient identified, Emergency Drugs available, Suction available and Patient being monitored ?Patient Re-evaluated:Patient Re-evaluated prior to induction ?Oxygen Delivery Method: Circle system utilized ?Preoxygenation: Pre-oxygenation with 100% oxygen ?Induction Type: IV induction ?Ventilation: Mask ventilation without difficulty ?Laryngoscope Size: Sabra Heck and 2 ?Grade View: Grade II ?Tube type: Oral ?Tube size: 7.0 mm ?Number of attempts: 1 ?Airway Equipment and Method: Stylet ?Placement Confirmation: ETT inserted through vocal cords under direct vision, positive ETCO2 and breath sounds checked- equal and bilateral ?Secured at: 21 cm ?Tube secured with: Tape ?Dental Injury: Teeth and Oropharynx as per pre-operative assessment  ? ? ? ? ?

## 2022-04-13 NOTE — Op Note (Signed)
? ? ?Patient name: Brittany [name] MRN: [id number] DOB: 10-Sep-1961 Sex: female ? ?04/13/2022 ?Pre-operative Diagnosis: Congestive heart failure ?Post-operative diagnosis:  Same ?Surgeon:  Annamarie Major ?Assistants:  Leontine Locket, PA ?Procedure:   Barostim Neo device implant on the right side ?Anesthesia:  General ?Blood Loss:  450 cc ?Specimens:  none ? ?Findings: Device was implanted at position a with excellent hemodynamic response.  During the tunneling, I experienced a fair amount of venous bleeding.  I had to make a counterincision in order to achieve hemostasis.  A branch off of the internal jugular vein had been torn.  This was primarily repaired on the internal jugular vein with a 5-0 Prolene. ? ?Device:  SN [id number], MODEL [id number] ?SN [id number], [id number] ?Pulse width: 125.6 ?Amplitude: 1 mA ?Frequency: 40 PPS ?Voltage: 2.81 ?Interaction testing was unremarkable ? ?Indications: This is a 61 year old female with congestive heart failure on maximal medical therapy.  She has been evaluated and found to be a good candidate for Barostim device implant.  Risks and benefits of the procedure were discussed with the patient and she wished to proceed. ? ?Procedure:  The patient was identified in the holding area and taken to [place] [facility]  The patient was then placed supine on the table. general anesthesia was administered.  The patient was prepped and draped in the usual sterile fashion.  A time out was called and antibiotics were administered.  A PA was necessary to explant procedure and assist with technical details. ? ?Ultrasound was used to evaluate the right carotid artery and identified the bifurcation.  A 3 cm oblique incision was made over top of the carotid bifurcation.  Cautery was used about subcutaneous tissue and the platysma muscle.  With the PA providing suction and traction, I dissected out the common carotid artery.  I then dissected distally up to the bifurcation.  I identified the  hypoglossal nerve and protected it.  Once I had adequate exposure, attention was turned towards the pocket incision.  An incision was made 1 fingerbreadth below the clavicle.  Cautery was used divide the subcutaneous tissue down to the pectoral fascia.  Using cautery and blunt dissection, I created a pocket.  Next, a tonsil clamp was used to create a tunnel between the 2 incisions.  Upon completion of the tunnel, there was pretty significant venous bleeding.  This was controlled with manual pressure, however I was concerned that because of the amount of bleeding that this would not be sufficient.  I therefore elected to make a counterincision over the course of the tunnel where the bleeding appeared to originate.  I dissected down to the internal jugular vein and found that a fairly large branch had been avulsed.  I repaired the internal jugular vein with a 5-0 Prolene and placed clips on the other end of the branches.  This provided excellent hemostasis.  Next, the tubing was brought through the tunnel and connected to the generator which was placed into the pocket.  The electrode was placed on the carotid bifurcation and impedance testing was performed.  We then performed device testing at position a.  We had an excellent hemodynamic response.  I then placed two 6-0 Prolene sutures to anchor the electrode.  In order to do this, the PA used a [employer] retractor to facilitate exposure.  We repeated testing and had a good hemodynamic response again.  I then placed 4 additional 6-0 Prolene sutures to anchor the electrode.  I then created a strain relief loop  and sutured this to the adventitia of the common carotid artery with two 6-0 Prolene sutures.  I then copiously irrigated all incisions with antibiotic impregnated solution.  I then anchored the generator into the pocket with two 2-0 Ethibond sutures.  Irrigation with antibiotic solution was then performed again.  I ensured that hemostasis was satisfactory.   Next, all incisions were closed with 2 layers of Vicryl followed by Dermabond.  There were no immediate complications. ? ? ?Disposition: To PACU stable. ? ? ?V. Annamarie Major, M.D., FACS ?[facility] ?Office: [phone] ?Pager:  [phone]  ?

## 2022-04-13 NOTE — Progress Notes (Signed)
?PROGRESS NOTE ?  ?Brittany [name]  SWF:[id number]    DOB: 1961/05/03    DOA: 04/09/2022 ? ?PCP: Scifres, Dorothy, PA-C (Inactive)  ? ?I have briefly reviewed patients previous medical records in [facility]. ? ?Chief Complaint  ?Patient presents with  ? Respiratory Distress  ? ? 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  Admitted for acute on chronic respiratory failure with hypoxia due to suspected decompensated CHF and COPD exacerbation.  Advanced HF team consulted, diuresed with IV Lasix.  Clinically improved.  S/p Barostim implant by [facility]VS on 5/4. ? ?Assessment and Plan: ?* Acute on chronic respiratory failure with hypoxia (HCC) ?Patient confirms that she is on only as needed home oxygen. ?Required CPAP by EMS on arrival. ?Multifactorial due to decompensated CHF and COPD exacerbation, treated as below. ?Currently on room air and saturating in the high 90s-100%. ?Reassess home oxygen requirement prior to discharge. ? ?Acute on chronic systolic CHF (congestive heart failure) ([place]) ?Ischemic cardiomyopathy, s/p AICD ?Advanced heart failure cardiology team following. ?2D echo 1/23: LVEF 20-25% ?Treated with IV Lasix since 4/30. Clinically appears euvolemic and creatinine also has bumped up, Lasix discontinued by cardiology. ?Creatinine back down to 1.09 from 1.38.  Cardiology plans to start torsemide 5/5. ?Continue Farxiga 10 Mg daily, spironolactone 12.5 Mg daily, Corlanor 7.5 Mg twice daily. ?Not candidate for Entresto due to prior hyperkalemia, beta-blockers due to intolerance from  low output and COPD with frequent flares or for advanced therapies due to her advanced lung disease. ?S/p Barostim implant by [facility] on 5/4. ? ?COPD exacerbation ([place]) ?Continue DuoNebs and prednisone ?Complete course of doxycycline ?Flutter valve. ?Resolved. ? ?Hypotension ?Transient on admission. ?Procalcitonin negative. ?Lactate improved from 6.2-1.1. ?Antibiotics narrowed from ceftriaxone to doxycycline. ?Sepsis ruled out. ? ?AF (paroxysmal atrial fibrillation) ([place]) ?CHA2DS2-VASc score at least 4 ?Previously on Xarelto, stopped due to hemoptysis ?On Eliquis PTA, started in 12/22 due to renal infarct, suspected embolic ?Holding Eliquis for Barostim implant 5/3.  Resume Eliquis once cleared by [facility]. ? ?Type 2 diabetes mellitus with stage 3a chronic kidney disease, without long-term current use of insulin ([place]) ?A1c 5.9 on 04/09/2022 ?Labile CBGs but reasonable ?Continue SSI. ? ?Small cell lung cancer ([place]) ?S/p chemotherapy, XRT with prophylactic cranial irradiation ?Reportedly lost to follow-up since 04/2015 ?Needs to reestablish with oncology as outpatient for follow-up. ? ?Recurrent pleural effusion on right ?Resolved. ? ?Lactic acidosis ?Lactate peaked to 6.2. ?May be related to respiratory distress and decompensated CHF.  Sepsis ruled out. ?PCT negative. ?Resolved. ? ?Prolonged QT interval ?Minimize QT prolonging medications. ?QTc 519 ms by EKG 4/30 but patient has BBB morphology and so not sure if this is accurate. ?Has AICD. ? ?Body mass index is 20 kg/m?. ? ? ?DVT prophylaxis: SCD's Start: 04/13/22 1348 ?Place and maintain sequential compression device Start: 04/12/22 1440  Eliquis on hold for procedure.   ?  Code Status: Full Code:  ?Family Communication: None at bedside ?Disposition:  ?Status is: Inpatient ??  DC home 5/5. ?  ? ?Consultants:   ?Cardiology ?Vascular surgery ? ?Procedures:   ?None ? ?Antimicrobials:   ?As noted above ? ? ?  Subjective:  ?Seen this afternoon postprocedure.  Reports right-sided  neck pain at site of procedure.  Worse with coughing.  Denies any other complaints. ? ?Objective:  ? ?Vitals:  ? 04/13/22 1225 04/13/22 1240 04/13/22 1255 04/13/22 1310  ?BP: 108/67 110/62 (!) 108/52 108/63  ?Pulse: 74 74 72 71  ?Resp: 10 20 10 13   ?Temp:    97.8 ?F (36.6 ?C)  ?TempSrc:      ?SpO2: 96% 95% 96% 95%  ?Weight:      ?Height:      ? ? ?General exam: Middle-age female, small built and frail lying comfortably supine in bed without distress. ?Neck: Right side of neck procedure site without acute findings. ?Respiratory system: Clear to auscultation.  No increased work of breathing. ?Cardiovascular system: S1 & S2 heard, RRR. No JVD, murmurs, rubs, gallops or clicks. No pedal edema. ?Gastrointestinal system: Abdomen is nondistended, soft and nontender. No organomegaly or masses felt. Normal bowel sounds heard. ?Central nervous system: Alert and oriented. No focal neurological deficits. ?Extremities: Symmetric 5 x 5 power. ?Skin: No rashes, lesions or ulcers ?Psychiatry: Judgement and insight appear normal. Mood & affect appropriate.  ? ?Data Reviewed:   ?I have personally reviewed following labs and imaging studies ? ?CBC: ?Recent Labs  ?Lab 04/09/22 ?0700 04/10/22 ?6606 04/11/22 ?3016 04/12/22 ?0109 04/13/22 ?3235  ?WBC 12.8*   < > 13.3* 10.6* 10.0  ?NEUTROABS 9.8*  --   --   --   --   ?HGB 15.5*   < > 14.1 14.6 15.0  ?HCT 48.4*   < > 44.4 45.6 46.8*  ?MCV 98.8   < > 96.7 95.2 94.7  ?PLT 173   < > 142* 170 189  ? < > = values in this interval not displayed.  ? ?Basic Metabolic Panel: ?Recent Labs  ?Lab 04/10/22 ?0014 04/10/22 ?1135 04/11/22 ?5732 04/11/22 ?2025 04/12/22 ?4270 04/13/22 ?6237  ?NA 137 138  --  138 137 136  ?K 4.2 4.3  --  4.1 3.9 4.2  ?CL 103 105  --  104 103 103  ?CO2 24 25  --  24 25 23   ?GLUCOSE 121* 123*  --  121* 95 90  ?BUN 30* 29*  --  38* 41* 46*  ?CREATININE 1.17* 1.17*  --  1.29* 1.38* 1.09*  ?CALCIUM 9.4 9.8  --  9.8 9.7 9.6  ?MG 2.2  --  2.3  --  2.1  --   ? ?Liver Function  Tests: ?Recent Labs  ?Lab 04/10/22 ?1135 04/11/22 ?0850 04/12/22 ?0252  ?AST 24 26 22   ?ALT 18 20 24   ?ALKPHOS 64 66 59  ?BILITOT 0.6 0.7 0.8  ?PROT 6.9 6.5 6.6  ?ALBUMIN 3.7 3.7 3.6  ? ?CBG: ?Recent Labs  ?Lab 04/13/22 ?0739 04/13/22 ?1211 04/13/22 ?1509  ?GLUCAP 102* 143* 167*  ? ?Microbiology Studies:  ? ?Recent Results (from the past 240 hour(s))  ?Resp Panel by RT-PCR (Flu A&B, Covid) Nasopharyngeal Swab     Status: None  ? Collection Time: 04/09/22  7:12 AM  ? Specimen: Nasopharyngeal Swab; Nasopharyngeal(NP) swabs in vial transport medium  ?Result Value Ref Range Status  ? SARS Coronavirus 2 by RT PCR NEGATIVE NEGATIVE Final  ?  Comment: (NOTE) ?SARS-CoV-2 target nucleic acids are NOT DETECTED. ? ?The SARS-CoV-2 RNA is generally detectable in upper respiratory ?specimens during the acute phase of infection. The lowest ?concentration of SARS-CoV-2 viral copies this assay can detect is ?138 copies/mL. A negative result does not preclude SARS-Cov-2 ?infection and  should not be used as the sole basis for treatment or ?other patient management decisions. A negative result may occur with  ?improper specimen collection/handling, submission of specimen other ?than nasopharyngeal swab, presence of viral mutation(s) within the ?areas targeted by this assay, and inadequate number of viral ?copies(<138 copies/mL). A negative result must be combined with ?clinical observations, patient history, and epidemiological ?information. The expected result is Negative. ? ?Fact Sheet for Patients:  ?EntrepreneurPulse.com.au ? ?Fact Sheet for Healthcare Providers:  ?IncredibleEmployment.be ? ?This test is no t yet approved or cleared by the [place] FDA and  ?has been authorized for detection and/or diagnosis of SARS-CoV-2 by ?FDA under an Emergency Use Authorization (EUA). This EUA will remain  ?in effect (meaning this test can be used) for the duration of the ?COVID-19 declaration under  Section 564(b)(1) of the Act, 21 ?U.S.C.section 360bbb-3(b)(1), unless the authorization is terminated  ?or revoked sooner.  ? ? ?  ? Influenza A by PCR NEGATIVE NEGATIVE Final  ? Influenza B by PCR NEGATIVE NEGATIVE

## 2022-04-13 NOTE — Anesthesia Procedure Notes (Addendum)
Arterial Line Insertion ?Start/End5/03/2022 9:09 AM, 04/13/2022 9:14 AM ?Performed by: Lynda Rainwater, MD, anesthesiologist ? Patient location: Pre-op. ?Preanesthetic checklist: patient identified, IV checked, site marked, risks and benefits discussed, surgical consent, monitors and equipment checked, pre-op evaluation and timeout performed ?Lidocaine 1% used for infiltration ?Left, radial was placed ?Catheter size: 20 G ?Hand hygiene performed  ? ?Attempts: 4 ?Procedure performed using ultrasound guided technique. ?Ultrasound Notes:anatomy identified, needle tip was noted to be adjacent to the nerve/plexus identified and no ultrasound evidence of intravascular and/or intraneural injection ?Following insertion, dressing applied and Biopatch. ?Post procedure assessment: normal ? ?Post procedure complications: second provider assisted and unsuccessful attempts. ?Patient tolerated the procedure well with no immediate complications. ?Additional procedure comments: Several CRNA attempts.  Anesthesiologist successful . ? ? ? ?

## 2022-04-13 NOTE — Progress Notes (Signed)
Spoke with [place], [place] with EP regarding patient's PPM and the need for the device to be reprogrammed for surgery and needing orders.  Jonni Sanger stated he would reach out to the medtronic rep. ?

## 2022-04-13 NOTE — Anesthesia Postprocedure Evaluation (Signed)
Anesthesia Post Note ? ?Patient: Brittany [name] ? ?Procedure(s) Performed: INSERTION OF BAROSTIM (Neck) ? ?  ? ?Patient location during evaluation: PACU ?Anesthesia Type: General ?Level of consciousness: awake and alert ?Pain management: pain level controlled ?Vital Signs Assessment: post-procedure vital signs reviewed and stable ?Respiratory status: spontaneous breathing, nonlabored ventilation and respiratory function stable ?Cardiovascular status: blood pressure returned to baseline and stable ?Postop Assessment: no apparent nausea or vomiting ?Anesthetic complications: no ? ? ?No notable events documented. ? ?Last Vitals:  ?Vitals:  ? 04/13/22 1255 04/13/22 1310  ?BP: (!) 108/52 108/63  ?Pulse: 72 71  ?Resp: 10 13  ?Temp:    ?SpO2: 96% 95%  ?  ?Last Pain:  ?Vitals:  ? 04/13/22 1310  ?TempSrc:   ?PainSc: 5   ? ? ?  ?  ?  ?  ?  ?  ? ?Lynda Rainwater ? ? ? ? ?

## 2022-04-13 NOTE — Anesthesia Preprocedure Evaluation (Signed)
Anesthesia Evaluation  ?Patient identified by MRN, date of birth, ID band ?Patient awake ? ? ? ?Reviewed: ?Allergy & Precautions, H&P , NPO status , Patient's Chart, lab work & pertinent test results ? ?Airway ?Mallampati: II ? ?TM Distance: >3 FB ?Neck ROM: Full ? ? ? Dental ?no notable dental hx. ?(+) Poor Dentition, Dental Advisory Given ?  ?Pulmonary ?asthma , COPD,  COPD inhaler, former smoker,  ?  ?Pulmonary exam normal ?breath sounds clear to auscultation ? ? ? ? ? ? Cardiovascular ?Exercise Tolerance: Good ?hypertension, Pt. on medications and Pt. on home beta blockers ?+ CAD, + Past MI, + CABG and +CHF  ?+ pacemaker + Cardiac Defibrillator ?+ Valvular Problems/Murmurs AI  ?Rhythm:Regular Rate:Normal ? ? ?  ?Neuro/Psych ?Anxiety negative neurological ROS ?   ? GI/Hepatic ?negative GI ROS, Neg liver ROS,   ?Endo/Other  ?diabetes, Type 2, Oral Hypoglycemic Agents ? Renal/GU ?negative Renal ROS  ?negative genitourinary ?  ?Musculoskeletal ? ? Abdominal ?  ?Peds ? Hematology ?negative hematology ROS ?(+)   ?Anesthesia Other Findings ? ? Reproductive/Obstetrics ? ?  ? ? ? ? ? ? ? ? ? ? ? ? ? ?  ?  ? ? ? ? ? ? ? ? ?Anesthesia Physical ? ?Anesthesia Plan ? ?ASA: IV ? ?Anesthesia Plan: General  ? ?Post-op Pain Management: Minimal or no pain anticipated  ? ?Induction: Intravenous ? ?PONV Risk Score and Plan: 3 and Ondansetron, Dexamethasone, Midazolam and Treatment may vary due to age or medical condition ? ?Airway Management Planned: Oral ETT ? ?Additional Equipment: Arterial line ? ?Intra-op Plan:  ? ?Post-operative Plan: Extubation in OR ? ?Informed Consent: I have reviewed the patients History and Physical, chart, labs and discussed the procedure including the risks, benefits and alternatives for the proposed anesthesia with the patient or authorized representative who has indicated his/her understanding and acceptance.  ? ? ? ?Dental advisory given ? ?Plan Discussed with:  CRNA ? ?Anesthesia Plan Comments:   ? ? ? ? ? ? ?Anesthesia Quick Evaluation ? ?

## 2022-04-13 NOTE — Interval H&P Note (Signed)
History and Physical Interval Note: ? ?04/13/2022 ?8:55 AM ? ?Brittany [name]  has presented today for surgery, with the diagnosis of congestive heart failure.  The various methods of treatment have been discussed with the patient and family. After consideration of risks, benefits and other options for treatment, the patient has consented to  Procedure(s): ?INSERTION OF BAROSTIM (N/A) as a surgical intervention.  The patient's history has been reviewed, patient examined, no change in status, stable for surgery.  I have reviewed the patient's chart and labs.  Questions were answered to the patient's satisfaction.   ? ? ?[name] ? ? ?

## 2022-04-13 NOTE — Plan of Care (Signed)
?  Problem: Education: ?Goal: Ability to demonstrate management of disease process will improve ?Outcome: Progressing ?  ?Problem: Activity: ?Goal: Capacity to carry out activities will improve ?Outcome: Progressing ?  ?Problem: Cardiac: ?Goal: Ability to achieve and maintain adequate cardiopulmonary perfusion will improve ?Outcome: Progressing ?  ?Problem: Education: ?Goal: Knowledge of General Education information will improve ?Description: Including pain rating scale, medication(s)/side effects and non-pharmacologic comfort measures ?Outcome: Progressing ?  ?Problem: Health Behavior/Discharge Planning: ?Goal: Ability to manage health-related needs will improve ?Outcome: Progressing ?  ?Problem: Clinical Measurements: ?Goal: Cardiovascular complication will be avoided ?Outcome: Progressing ?  ?Problem: Pain Managment: ?Goal: General experience of comfort will improve ?Outcome: Progressing ?  ?Problem: Safety: ?Goal: Ability to remain free from injury will improve ?Outcome: Progressing ?  ?

## 2022-04-13 NOTE — Progress Notes (Signed)
[employer] rep paged. ?

## 2022-04-14 DIAGNOSIS — I480 Paroxysmal atrial fibrillation: Secondary | ICD-10-CM | POA: Diagnosis not present

## 2022-04-14 DIAGNOSIS — J441 Chronic obstructive pulmonary disease with (acute) exacerbation: Secondary | ICD-10-CM | POA: Diagnosis not present

## 2022-04-14 DIAGNOSIS — J9621 Acute and chronic respiratory failure with hypoxia: Secondary | ICD-10-CM | POA: Diagnosis not present

## 2022-04-14 LAB — BASIC METABOLIC PANEL
Anion gap: 9 (ref 5–15)
BUN: 40 mg/dL — ABNORMAL HIGH (ref 8–23)
CO2: 24 mmol/L (ref 22–32)
Calcium: 9.2 mg/dL (ref 8.9–10.3)
Chloride: 103 mmol/L (ref 98–111)
Creatinine, Ser: 1.27 mg/dL — ABNORMAL HIGH (ref 0.44–1.00)
GFR, Estimated: 48 mL/min — ABNORMAL LOW (ref >60)
Glucose, Bld: 145 mg/dL — ABNORMAL HIGH (ref 70–99)
Potassium: 4.3 mmol/L (ref 3.5–5.1)
Sodium: 136 mmol/L (ref 135–145)

## 2022-04-14 LAB — CBC
HCT: 38.9 % (ref 36.0–46.0)
Hemoglobin: 12.1 g/dL (ref 12.0–15.0)
MCH: 30 pg (ref 26.0–34.0)
MCHC: 31.1 g/dL (ref 30.0–36.0)
MCV: 96.5 fL (ref 80.0–100.0)
Platelets: 155 10*3/uL (ref 150–400)
RBC: 4.03 MIL/uL (ref 3.87–5.11)
RDW: 18.4 % — ABNORMAL HIGH (ref 11.5–15.5)
WBC: 11.4 10*3/uL — ABNORMAL HIGH (ref 4.0–10.5)
nRBC: 0 % (ref 0.0–0.2)

## 2022-04-14 LAB — GLUCOSE, CAPILLARY
Glucose-Capillary: 96 mg/dL (ref 70–99)
Glucose-Capillary: 192 mg/dL — ABNORMAL HIGH (ref 70–99)
Glucose-Capillary: 175 mg/dL — ABNORMAL HIGH (ref 70–99)
Glucose-Capillary: 236 mg/dL — ABNORMAL HIGH (ref 70–99)

## 2022-04-14 LAB — CULTURE, BLOOD (SINGLE)
Culture: NO GROWTH
Special Requests: ADEQUATE

## 2022-04-14 MED ORDER — ONDANSETRON HCL 4 MG/2ML IJ SOLN
4.0000 mg | Freq: Three times a day (TID) | INTRAMUSCULAR | Status: DC | PRN
  Filled 2022-04-14: qty 2

## 2022-04-14 MED ORDER — APIXABAN 5 MG PO TABS
5.0000 mg | ORAL_TABLET | Freq: Two times a day (BID) | ORAL | Status: DC
  Administered 2022-04-14 – 2022-04-16 (×4): 5 mg via ORAL
  Filled 2022-04-14 (×4): qty 1

## 2022-04-14 MED ORDER — FUROSEMIDE 10 MG/ML IJ SOLN
80.0000 mg | Freq: Two times a day (BID) | INTRAMUSCULAR | Status: DC
  Administered 2022-04-14 – 2022-04-16 (×5): 80 mg via INTRAVENOUS
  Filled 2022-04-14 (×5): qty 8

## 2022-04-14 NOTE — Progress Notes (Signed)
Upon entering the room patient was upset. Patient declined to take 2 units of Novolog for her blood sugar of 236. Patient states she just would like to rest. Inda Merlin notified. ?

## 2022-04-14 NOTE — Progress Notes (Addendum)
? ? Advanced Heart Failure Rounding Note ? ?PCP-Cardiologist: None  ? ?Subjective:   ?4/30 Midodrine stopped  ?5/1 Diuresed with IV lasix. Weight down 102-->101  ?5/4 S/P Barostim . Had some bleeding from internal jugular.  ? ?Complaining of neck pain. Denies SOB.  ? ?Objective:   ?Weight Range: ?46.4 kg ?Body mass index is 20.68 kg/m?.  ? ?Vital Signs:   ?Temp:  [97.5 ?F (36.4 ?C)-98.3 ?F (36.8 ?C)] 98.1 ?F (36.7 ?C) (05/05 0419) ?Pulse Rate:  [52-84] 78 (05/05 0419) ?Resp:  [10-20] 20 (05/05 0419) ?BP: (108-122)/(52-73) 108/66 (05/05 0419) ?SpO2:  [95 %-100 %] 100 % (05/05 0419) ?Weight:  [46.4 kg] 46.4 kg (05/05 0443) ?Last BM Date : 04/11/22 ? ?Weight change: ?Filed Weights  ? 04/12/22 0559 04/13/22 0428 04/14/22 0443  ?Weight: 44.6 kg 44.9 kg 46.4 kg  ? ? ?Intake/Output:  ? ?Intake/Output Summary (Last 24 hours) at 04/14/2022 0810 ?Last data filed at 04/14/2022 0400 ?Gross per 24 hour  ?Intake 2060 ml  ?Output 1050 ml  ?Net 1010 ml  ?  ? ? ?Physical Exam  ?  ?Genera No resp difficulty ?HEENT: normal ?Neck: supple. JVP 11-12 . Carotids 2+ bilat; no bruits. No lymphadenopathy or thryomegaly appreciated. R neck incision x2 with some swelling.  ?Cor: PMI nondisplaced. Regular rate & rhythm. No rubs, gallops or murmurs. ?Lungs:  Coarse throughout.  ON 2 liters ?Abdomen: soft, nontender, nondistended. No hepatosplenomegaly. No bruits or masses. Good bowel sounds. ?Extremities: no cyanosis, clubbing, rash, edema ?Neuro: alert & orientedx3, cranial nerves grossly intact. moves all 4 extremities w/o difficulty. Affect pleasant ? ?Telemetry  ? ?NSR 60-70s  ? ?EKG  ?  ?N/A  ? ?Labs  ?  ?CBC ?Recent Labs  ?  04/13/22 ?0352 04/14/22 ?0129  ?WBC 10.0 11.4*  ?HGB 15.0 12.1  ?HCT 46.8* 38.9  ?MCV 94.7 96.5  ?PLT 189 155  ? ?Basic Metabolic Panel ?Recent Labs  ?  04/12/22 ?0252 04/13/22 ?0352 04/14/22 ?0129  ?NA 137 136 136  ?K 3.9 4.2 4.3  ?CL 103 103 103  ?CO2 25 23 24   ?GLUCOSE 95 90 145*  ?BUN 41* 46* 40*  ?CREATININE 1.38*  1.09* 1.27*  ?CALCIUM 9.7 9.6 9.2  ?MG 2.1  --   --   ? ?Liver Function Tests ?Recent Labs  ?  04/11/22 ?0850 04/12/22 ?0252  ?AST 26 22  ?ALT 20 24  ?ALKPHOS 66 59  ?BILITOT 0.7 0.8  ?PROT 6.5 6.6  ?ALBUMIN 3.7 3.6  ? ?No results for input(s): LIPASE, AMYLASE in the last 72 hours. ?Cardiac Enzymes ?No results for input(s): CKTOTAL, CKMB, CKMBINDEX, TROPONINI in the last 72 hours. ? ?BNP: ?BNP (last 3 results) ?Recent Labs  ?  03/21/22 ?1400 03/24/22 ?1227 04/09/22 ?0700  ?BNP 1,514.7* 650.7* 2,367.1*  ? ? ?ProBNP (last 3 results) ?Recent Labs  ?  07/19/21 ?0945  ?PROBNP 3,162*  ? ? ? ?D-Dimer ?No results for input(s): DDIMER in the last 72 hours. ? ?Hemoglobin A1C ?No results for input(s): HGBA1C in the last 72 hours. ? ?Fasting Lipid Panel ?No results for input(s): CHOL, HDL, LDLCALC, TRIG, CHOLHDL, LDLDIRECT in the last 72 hours. ?Thyroid Function Tests ?No results for input(s): TSH, T4TOTAL, T3FREE, THYROIDAB in the last 72 hours. ? ?Invalid input(s): FREET3 ? ?Other results: ? ? ?Imaging  ? ? ?No results found. ? ? ?Medications:   ? ? ?Scheduled Medications: ? atorvastatin  80 mg Oral q1800  ? dapagliflozin propanediol  10 mg Oral Daily  ? insulin  aspart  0-5 Units Subcutaneous QHS  ? insulin aspart  0-9 Units Subcutaneous TID WC  ? ivabradine  7.5 mg Oral BID WC  ? pantoprazole  40 mg Oral Daily  ? predniSONE  40 mg Oral Q breakfast  ? sodium chloride flush  3 mL Intravenous Q12H  ? spironolactone  12.5 mg Oral Daily  ? ? ?Infusions: ? ? ? ?PRN Medications: ?acetaminophen **OR** acetaminophen, albuterol, benzonatate, guaiFENesin-dextromethorphan, hydrALAZINE, labetalol, oxyCODONE-acetaminophen ? ? ? ?Patient Profile  ?Kellis Topete is a 61 y.o. 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 ? ?Admitted with HF +  COPD ? ? ?Assessment/Plan  ?Acute on Chronic Systolic Heart Failure Due to ICM  ?- Echo 09/04/18 ([facility]) LVEF 20%, Moderate AI, Mild MR, Mild TR, Severe LAE, RV mildly decreased.  ?- Echo 02/12/19: EF 25-30%, RV mildly reduced ?- Echo 11/21 EF 20-25% RV mild HK. ?- Echo 2022 EF < 20% RV normal ?- Echo 1/23 EF 20-25% RV ok  ?- Has Med[employer] single chamber ICD  ?- Volume status back up after surgery. Given 80 mg IV lasix twice a day. Stop torsemide.  ?- Failed Entresto due to hyperkalemia   ?- Continue Farxiga 10 mg daily  ?- Continue spiro 12.5 mg daily ?- Continue corlanor 7.5 mg bid. ?- No b-blocker due to intolerance with low output and bad COPD with frequent flares ?- Not a candidate for advanced therapies with her degree of lung disease.   ? ?2. Acute COPDE ?- lung exam improving  ?- management per primary  ?- on Doxy, prednisone and nebs  ?  ?3. Lactic Acidosis, Resolved   ?- transient hypotension on admit w/ initial LA of 6.2-->1.1 ?- PCT negative, <0.10 ? ?4. Acute on Chronic Hypoxic Respiratory Failure  ?- Multifactorial, former smoker, h/o small cell lung cancer treated with chemo, chest XRT, COPD and HF ?- acute exacerbation 2/2 ACOPDE + a/c CHF  ?- CXR w/o infiltrate. + small pleural effusions. PCT negative  ?- improving  ?- COPD management and diuresis per above  ?- Stable on 2 liters.  ?  ?5. Recurrent pleural effusions s/p pleurodesis ?- Resolved. Admit CXR ok ?  ?6. PAF ?- CHA2DS2/VASc is at least 4. (CHF, Vasc disease, HTN, Female).   ?- She has history of Afib RVR in the past in the setting of her Lung CA.  ?- Previously on Xarelto but stopped due to hemoptysis.  ?- Started on Eliquis in 12/22 due to renal infarct. ?- Holding Eliquis for Barostim implant . Restart when ok with Vasculr  ?  ?7. H/o R renal infarct ?- 12/22 ?- Thought to be cardioembolic ?- holding Eliquis per above. Resume once cleared by [facility]  ?   ?8. 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  ?- Chest CT 02/19/18 with  mass-like consolidation in R hilum concerning for recurrent tumor.  ?- Repeat CT 03/27/2018 -No definite findings of locally recurrent tumor in the right lung. 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 Continued chest CT surveillance is advised in 3-6 months. ?- S/p thoracentesis 04/2018. Cytology negative for malignancy.  ?- CTA 11/19 stable scarring in the right hilum consistent with emphysema. Bronchoscopy on 11/15, no obvious source of bleeding ?  ?9. Leukocytosis ?- WBC 12>>15>10>11.4  AF. PCT negative ?- suspect 2/2 to prednisone for COPDE  ? ?10. S/p Barostim Implant ?- post operative management per [facility] ?- appreciate Dr. Stephens Shire assistance  ?- Restart eliquis when ok with Dr Trula Slade.  ? ?Length of Stay: 5 ? ?Darrick Grinder, NP  ?04/14/2022, 8:10 AM ? ?Advanced Heart Failure Team ?Pager [phone] (M-F; 7a - 5p)  ?Please contact [facility] for night-coverage after hours (5p -7a ) and weekends on [email or web address] ? ?Patient seen and examined with the above-signed Advanced Practice Provider and/or Housestaff. I personally reviewed laboratory data, imaging studies and relevant notes. I independently examined the patient and formulated the important aspects of the plan. I have edited the note to reflect any of my changes or salient points. I have personally discussed the plan with the patient and/or family. ? ?Remains sore at [facility] site. Feels weak. Denies SOB ? ?General:  Weak appearing. No resp difficulty ?HEENT: normal ?Neck: supple. + fullness/hematoma. (Stable) JVP looks upe Carotids 2+ bilat; no bruits. No lymphadenopathy or thryomegaly appreciated. ?Cor: PMI nondisplaced. Regular rate & rhythm. No rubs, gallops or murmurs. ?Lungs: clear ?Abdomen: soft, nontender, nondistended. No hepatosplenomegaly. No bruits or masses. Good bowel sounds. ?Extremities: no cyanosis, clubbing, rash, edema ?Neuro: alert & orientedx3, cranial nerves grossly  intact. moves all 4 extremities w/o difficulty. Affect pleasant ? ?Remains sore at operative site but stable. Volume up a bit. Will give IV lasix. Continue pain control. Ambulate as tolerated. Hold Eliquis.  ? ?Marya Fossa

## 2022-04-14 NOTE — Progress Notes (Addendum)
?Progress Note ? ? ? ?04/14/2022 ?6:56 AM ?1 Day Post-Op ? ?Subjective:  says she's a little sore but otherwise doing ok.  Says she is moving everything normally.   ? ?Afebrile ? ?Vitals:  ? 04/14/22 0048 04/14/22 0419  ?BP: (!) 112/58 108/66  ?Pulse: (!) 54 78  ?Resp: 18 20  ?Temp: 98.1 ?F (36.7 ?C) 98.1 ?F (36.7 ?C)  ?SpO2: 98% 100%  ? ? ?Physical Exam: ?General:  no distress; resting comfortably ?Lungs:  non labored ?Incisions:  mild fullness neck incision but otherwise all incisions look good without hematoma ?Neuro:  moving all extremities equally.  Tongue is midline ? ? ?CBC ?   ?Component Value Date/Time  ? WBC 11.4 (H) 04/14/2022 0129  ? RBC 4.03 04/14/2022 0129  ? HGB 12.1 04/14/2022 0129  ? HGB 17.7 (H) 07/19/2021 0945  ? HGB 17.6 (H) 12/23/2014 0829  ? HCT 38.9 04/14/2022 0129  ? HCT 53.9 (H) 07/19/2021 0945  ? HCT 53.9 (H) 12/23/2014 0829  ? PLT 155 04/14/2022 0129  ? PLT 169 07/19/2021 0945  ? MCV 96.5 04/14/2022 0129  ? MCV 91 07/19/2021 0945  ? MCV 102.1 (H) 12/23/2014 0829  ? MCH 30.0 04/14/2022 0129  ? MCHC 31.1 04/14/2022 0129  ? RDW 18.4 (H) 04/14/2022 0129  ? RDW 17.5 (H) 07/19/2021 0945  ? RDW 20.9 (H) 12/23/2014 0829  ? LYMPHSABS 1.6 04/09/2022 0700  ? LYMPHSABS 0.7 (L) 12/23/2014 0829  ? MONOABS 1.1 (H) 04/09/2022 0700  ? MONOABS 0.4 12/23/2014 0829  ? EOSABS 0.2 04/09/2022 0700  ? EOSABS 0.2 12/23/2014 0829  ? BASOSABS 0.1 04/09/2022 0700  ? BASOSABS 0.0 12/23/2014 0829  ? ? ?BMET ?   ?Component Value Date/Time  ? NA 136 04/14/2022 0129  ? NA 142 07/19/2021 0945  ? NA 142 12/23/2014 0829  ? K 4.3 04/14/2022 0129  ? K 3.9 12/23/2014 0829  ? CL 103 04/14/2022 0129  ? CO2 24 04/14/2022 0129  ? CO2 26 12/23/2014 0829  ? GLUCOSE 145 (H) 04/14/2022 0129  ? GLUCOSE 110 12/23/2014 0829  ? BUN 40 (H) 04/14/2022 0129  ? BUN 21 07/19/2021 0945  ? BUN 7.2 12/23/2014 0829  ? CREATININE 1.27 (H) 04/14/2022 0129  ? CREATININE 1.19 (H) 01/13/2020 1406  ? CREATININE 0.7 12/23/2014 0829  ? CALCIUM 9.2  04/14/2022 0129  ? CALCIUM 9.3 12/23/2014 0829  ? GFRNONAA 48 (L) 04/14/2022 0129  ? GFRNONAA 50 (L) 01/13/2020 1406  ? GFRAA >60 05/24/2020 0534  ? GFRAA 58 (L) 01/13/2020 1406  ? ? ?INR ?   ?Component Value Date/Time  ? INR 1.0 12/10/2021 2234  ? ? ? ?Intake/Output Summary (Last 24 hours) at 04/14/2022 0656 ?Last data filed at 04/14/2022 0400 ?Gross per 24 hour  ?Intake 2060 ml  ?Output 1050 ml  ?Net 1010 ml  ? ? ? ?Assessment/Plan:  61 y.o. female is s/p:  ?Barostim Neo device implant on the right side  ?1 Day Post-Op ? ? ?-pt doing well this am and neuro in tact. ?-neck incision with mild fullness but no hematoma and all incisions look good.   ?-acute blood loss anemia with hgb 12.1 this am down from 15 yesterday.  Pt tolerating.  ?-she will most likely follow up in the heart failure clinic.   ? ? ? ?Leontine Locket, PA-C ?[facility] ?[phone] ?04/14/2022 ?6:56 AM ? ?I agree with the above.  I have seen and evaluated patient.  She is postoperative day #1 status post  Barostim device implant.  She is complaining of some neck discomfort.  There is mild fullness but no obvious hematoma.  Overall she is doing well from her surgery.  She will be titrated up on her therapy as an outpatient.  She tells me she is going to be diuresed further today.  No further vascular issues.  Please contact me with any additional questions. ? ?Annamarie Major ? ?

## 2022-04-14 NOTE — Progress Notes (Signed)
?PROGRESS NOTE ?  ?Brittany [name]  PNT:[id number]    DOB: [birth year]    DOA: 04/09/2022 ? ?PCP: Scifres, Dorothy, PA-C (Inactive)  ? ?I have briefly reviewed patients previous medical records in [facility]. ? ?Chief Complaint  ?Patient presents with  ? Respiratory Distress  ? ? 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  Admitted for acute on chronic respiratory failure with hypoxia due to suspected decompensated CHF and COPD exacerbation.  Advanced HF team consulted, diuresed with IV Lasix.  Clinically improved.  S/p Barostim implant by [facility] on 5/4. ? ?Assessment and Plan: ?* Acute on chronic respiratory failure with hypoxia (HCC) ?Patient confirms that she is on only as needed home oxygen. ?Required CPAP by EMS on arrival. ?Multifactorial due to decompensated CHF and COPD exacerbation, treated as below. ?Was on room air saturating in the high 90s-100s yesterday, currently on [place] 2 L/min oxygen and saturating in the 100s and should be able to wean off oxygen again. ?Reassess home oxygen requirement prior to discharge. ? ?Acute on chronic systolic CHF (congestive heart failure) ([place]) ?Ischemic cardiomyopathy, s/p AICD ?Advanced heart failure cardiology team following. ?2D echo 1/23: LVEF 20-25% ?Treated with IV Lasix since 4/30. Clinically appears euvolemic and creatinine also has bumped up, Lasix had been DC'd. ?Continue Farxiga 10 Mg daily, spironolactone 12.5 Mg daily, Corlanor 7.5 Mg twice daily. ?Not candidate for Entresto due to prior hyperkalemia, beta-blockers due to intolerance  from low output and COPD with frequent flares or for advanced therapies due to her advanced lung disease. ?S/p Barostim implant by [facility] on 5/4. ?Post Barostim, volume up, cardiology have reinitiated IV Lasix 80 mg twice daily.  Monitor creatinine closely, already up from 1.09-1.27. ? ?COPD exacerbation ([place]) ?Continue DuoNebs and prednisone ?Complete course of doxycycline ?Flutter valve. ?Resolved. ? ?Hypotension ?Transient on admission. ?Procalcitonin negative. ?Lactate improved from 6.2-1.1. ?Antibiotics narrowed from ceftriaxone to doxycycline. ?Sepsis ruled out. ? ?AF (paroxysmal atrial fibrillation) ([place]) ?CHA2DS2-VASc score at least 4 ?Previously on Xarelto, stopped due to hemoptysis ?On Eliquis PTA, started in 12/22 due to renal infarct, suspected embolic ?Eliquis was held for [facility] implant 5/3.  Resume Eliquis 5/5 PM as communicated with Dr. Trula Slade. ? ?Type 2 diabetes mellitus with stage 3a chronic kidney disease, without long-term current use of insulin ([place]) ?A1c 5.9 on 04/09/2022 ?Labile CBGs but reasonable ?Continue SSI. ? ?Small cell lung cancer ([place]) ?S/p chemotherapy, XRT with prophylactic cranial irradiation ?Reportedly lost to follow-up since 04/2015 ?Needs to reestablish with oncology as outpatient for follow-up. ? ?Recurrent pleural effusion on right ?Resolved. ? ?Lactic acidosis ?Lactate peaked to 6.2. ?May be related to respiratory distress and decompensated CHF.  Sepsis ruled out. ?PCT negative. ?Resolved. ? ?Prolonged QT interval ?Minimize QT prolonging medications. ?QTc 519 ms by EKG 4/30 but patient has BBB morphology and so not sure if this is accurate. ?Has AICD. ?QTc by EKG 5/5: 478 ms as communicated by RN (do not see the EKG in [facility] yet).  This was done to check QTc before ordering some Zofran for nausea. ? ?Body mass index is 20.68 kg/m?. ? ? ?DVT prophylaxis: SCD's Start:  04/13/22 1348 ?Place and maintain sequential compression device Start: 04/12/22 1440  Eliquis on hold for  procedure.   ?  Code Status: Full Code:  ?Family Communication: None at bedside ?Disposition:  ?Status is: Inpatient ??  DC home 5/5. ?  ? ?Consultants:   ?Cardiology ?Vascular surgery ? ?Procedures:   ?None ? ?Antimicrobials:   ?As noted above ? ? ?Subjective:  ?Ongoing soreness at [facility] site.  Also reports nausea but no vomiting.  Overall weakness.  No chest pain or dyspnea. ? ?Objective:  ? ?Vitals:  ? 04/14/22 0753 04/14/22 0848 04/14/22 1153 04/14/22 1248  ?BP: 123/71 123/71 109/63 109/63  ?Pulse: 62 62 (!) 57 72  ?Resp: (!) 8 18 18 18   ?Temp:  97.8 ?F (36.6 ?C) 97.7 ?F (36.5 ?C) 97.7 ?F (36.5 ?C)  ?TempSrc:   Oral   ?SpO2: 100% 100% 100% 100%  ?Weight:      ?Height:      ? ? ?General exam: Middle-age female, small built and frail sitting up uncomfortably in bed attempting to eat breakfast. ?Neck: Right side of neck procedure site without acute findings. ?Respiratory system: Clear to auscultation.  No increased work of breathing. ?Cardiovascular system: S1 & S2 heard, RRR. No JVD, murmurs, rubs, gallops or clicks. No pedal edema. ?Gastrointestinal system: Abdomen is nondistended, soft and nontender. No organomegaly or masses felt. Normal bowel sounds heard. ?Central nervous system: Alert and oriented. No focal neurological deficits. ?Extremities: Symmetric 5 x 5 power. ?Skin: No rashes, lesions or ulcers ?Psychiatry: Judgement and insight appear normal. Mood & affect appropriate.  ? ?Data Reviewed:   ?I have personally reviewed following labs and imaging studies ? ?CBC: ?Recent Labs  ?Lab 04/09/22 ?0700 04/10/22 ?4268 04/12/22 ?3419 04/13/22 ?6222 04/14/22 ?0129  ?WBC 12.8*   < > 10.6* 10.0 11.4*  ?NEUTROABS 9.8*  --   --   --   --   ?HGB 15.5*   < > 14.6 15.0 12.1  ?HCT 48.4*   < > 45.6 46.8* 38.9  ?MCV 98.8   < > 95.2 94.7 96.5  ?PLT 173   < > 170 189 155  ? < > = values in this interval not displayed.  ? ?Basic Metabolic Panel: ?Recent Labs  ?Lab 04/10/22 ?0014 04/10/22 ?1135 04/11/22 ?9798 04/11/22 ?9211  04/12/22 ?9417 04/13/22 ?4081 04/14/22 ?0129  ?NA 137 138  --  138 137 136 136  ?K 4.2 4.3  --  4.1 3.9 4.2 4.3  ?CL 103 105  --  104 103 103 103  ?CO2 24 25  --  24 25 23 24   ?GLUCOSE 121* 123*  --  121* 95 90 145*  ?BUN 30* 29*  --  38* 41* 46* 40*  ?CREATININE 1.17* 1.17*  --  1.29* 1.38* 1.09* 1.27*  ?CALCIUM 9.4 9.8  --  9.8 9.7 9.6 9.2  ?MG 2.2  --  2.3  --  2.1  --   --   ? ?Liver Function Tests: ?Recent Labs  ?Lab 04/10/22 ?1135 04/11/22 ?0850 04/12/22 ?0252  ?AST 24 26 22   ?ALT 18 20 24   ?ALKPHOS 64 66 59  ?BILITOT 0.6 0.7 0.8  ?PROT 6.9 6.5 6.6  ?ALBUMIN 3.7 3.7 3.6  ? ?CBG: ?Recent Labs  ?Lab 04/13/22 ?2109 04/14/22 ?0759 04/14/22 ?1157  ?GLUCAP 190* 96 192*  ? ?Microbiology Studies:  ? ?Recent Results (from the past 240 hour(s))  ?Resp Panel by RT-PCR (Flu A&B, Covid) Nasopharyngeal Swab     Status: None  ? Collection  Time: 04/09/22  7:12 AM  ? Specimen: Nasopharyngeal Swab; Nasopharyngeal(NP) swabs in vial transport medium  ?Result Value Ref Range Status  ? SARS Coronavirus 2 by RT PCR NEGATIVE NEGATIVE Final  ?  Comment: (NOTE) ?SARS-CoV-2 target nucleic acids are NOT DETECTED. ? ?The SARS-CoV-2 RNA is generally detectable in upper respiratory ?specimens during the acute phase of infection. The lowest ?concentration of SARS-CoV-2 viral copies this assay can detect is ?138 copies/mL. A negative result does not preclude SARS-Cov-2 ?infection and should not be used as the sole basis for treatment or ?other patient management decisions. A negative result may occur with  ?improper specimen collection/handling, submission of specimen other ?than nasopharyngeal swab, presence of viral mutation(s) within the ?areas targeted by this assay, and inadequate number of viral ?copies(<138 copies/mL). A negative result must be combined with ?clinical observations, patient history, and epidemiological ?information. The expected result is Negative. ? ?Fact Sheet for Patients:   ?EntrepreneurPulse.com.au ? ?Fact Sheet for Healthcare Providers:  ?IncredibleEmployment.be ? ?This test is no t yet approved or cleared by the [place] FDA and  ?has been authorized for detection and/or diagno

## 2022-04-15 DIAGNOSIS — J441 Chronic obstructive pulmonary disease with (acute) exacerbation: Secondary | ICD-10-CM | POA: Diagnosis not present

## 2022-04-15 DIAGNOSIS — J9621 Acute and chronic respiratory failure with hypoxia: Secondary | ICD-10-CM | POA: Diagnosis not present

## 2022-04-15 DIAGNOSIS — I5023 Acute on chronic systolic (congestive) heart failure: Secondary | ICD-10-CM | POA: Diagnosis not present

## 2022-04-15 LAB — CBC
HCT: 36.3 % (ref 36.0–46.0)
Hemoglobin: 11.6 g/dL — ABNORMAL LOW (ref 12.0–15.0)
MCH: 30.4 pg (ref 26.0–34.0)
MCHC: 32 g/dL (ref 30.0–36.0)
MCV: 95 fL (ref 80.0–100.0)
Platelets: 168 10*3/uL (ref 150–400)
RBC: 3.82 MIL/uL — ABNORMAL LOW (ref 3.87–5.11)
RDW: 18.2 % — ABNORMAL HIGH (ref 11.5–15.5)
WBC: 9.6 10*3/uL (ref 4.0–10.5)
nRBC: 0 % (ref 0.0–0.2)

## 2022-04-15 LAB — BASIC METABOLIC PANEL
Anion gap: 11 (ref 5–15)
BUN: 43 mg/dL — ABNORMAL HIGH (ref 8–23)
CO2: 25 mmol/L (ref 22–32)
Calcium: 9.1 mg/dL (ref 8.9–10.3)
Chloride: 100 mmol/L (ref 98–111)
Creatinine, Ser: 1.39 mg/dL — ABNORMAL HIGH (ref 0.44–1.00)
GFR, Estimated: 43 mL/min — ABNORMAL LOW (ref >60)
Glucose, Bld: 104 mg/dL — ABNORMAL HIGH (ref 70–99)
Potassium: 3.8 mmol/L (ref 3.5–5.1)
Sodium: 136 mmol/L (ref 135–145)

## 2022-04-15 LAB — GLUCOSE, CAPILLARY
Glucose-Capillary: 100 mg/dL — ABNORMAL HIGH (ref 70–99)
Glucose-Capillary: 179 mg/dL — ABNORMAL HIGH (ref 70–99)
Glucose-Capillary: 229 mg/dL — ABNORMAL HIGH (ref 70–99)
Glucose-Capillary: 182 mg/dL — ABNORMAL HIGH (ref 70–99)

## 2022-04-15 MED ORDER — POTASSIUM CHLORIDE CRYS ER 20 MEQ PO TBCR
40.0000 meq | EXTENDED_RELEASE_TABLET | Freq: Once | ORAL | Status: AC
  Administered 2022-04-15: 40 meq via ORAL
  Filled 2022-04-15: qty 2

## 2022-04-15 NOTE — Progress Notes (Signed)
?PROGRESS NOTE ?  ?Brittany [name]  TIW:[id number]    DOB: 1961-03-06    DOA: 04/09/2022 ? ?PCP: Scifres, Dorothy, PA-C (Inactive)  ? ?I have briefly reviewed patients previous medical records in [facility]. ? ?Chief Complaint  ?Patient presents with  ? Respiratory Distress  ? ? 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  Admitted for acute on chronic respiratory failure with hypoxia due to suspected decompensated CHF and COPD exacerbation.  Advanced HF team consulted, diuresed with IV Lasix.  Clinically improved.  S/p Barostim implant by [facility] on 5/4. ? ?Assessment and Plan: ?* Acute on chronic respiratory failure with hypoxia (HCC) ?Patient confirms that she is on only as needed home oxygen. ?Required CPAP by EMS on arrival. ?Multifactorial due to decompensated CHF and COPD exacerbation, treated as below. ?Hypoxia resolved.  Saturating in the high 90s on room air. ?Reassess home oxygen requirement prior to discharge. ? ?Acute on chronic systolic CHF (congestive heart failure) ([place]) ?Ischemic cardiomyopathy, s/p AICD ?Advanced heart failure cardiology team following. ?2D echo 1/23: LVEF 20-25% ?Treated with IV Lasix since 4/30. Clinically appears euvolemic and creatinine also has bumped up, Lasix had been DC'd. ?Continue Farxiga 10 Mg daily, spironolactone 12.5 Mg daily, Corlanor 7.5 Mg twice daily. ?Not candidate for Entresto due to prior hyperkalemia, beta-blockers due to intolerance from low output and COPD with frequent flares or for advanced therapies due to her advanced lung  disease. ?S/p Barostim implant by [facility] on 5/4. ?Post Barostim, volume up, cardiology have reinitiated IV Lasix 80 mg twice daily.  Monitor creatinine closely, already up from 1.09-1.27-1.39. ?-4.2 L last 24 hours. ?Per cardiology, continue IV Lasix for an additional day. ? ?COPD exacerbation ([place]) ?Continue DuoNebs and prednisone ?Complete course of doxycycline ?Flutter valve. ?Resolved. ? ?Hypotension ?Transient on admission. ?Procalcitonin negative. ?Lactate improved from 6.2-1.1. ?Antibiotics narrowed from ceftriaxone to doxycycline. ?Sepsis ruled out. ? ?AF (paroxysmal atrial fibrillation) ([place]) ?CHA2DS2-VASc score at least 4 ?Previously on Xarelto, stopped due to hemoptysis ?On Eliquis PTA, started in 12/22 due to renal infarct, suspected embolic ?Eliquis was held for [facility] implant 5/3.  Resumed Eliquis 5/5 PM after confirming with Dr. Trula Slade. ? ?Type 2 diabetes mellitus with stage 3a chronic kidney disease, without long-term current use of insulin ([place]) ?A1c 5.9 on 04/09/2022 ?Labile CBGs but reasonable ?Continue SSI. ? ?Small cell lung cancer ([place]) ?S/p chemotherapy, XRT with prophylactic cranial irradiation ?Reportedly lost to follow-up since 04/2015 ?Needs to reestablish with oncology as outpatient for follow-up. ? ?Recurrent pleural effusion on right ?Resolved. ? ?Lactic acidosis ?Lactate peaked to 6.2. ?May be related to respiratory distress and decompensated CHF.  Sepsis ruled out. ?PCT negative. ?Resolved. ? ?Prolonged QT interval ?Minimize QT prolonging medications. ?QTc 519 ms by EKG 4/30 but patient has BBB morphology and so not sure if this is accurate. ?Has AICD. ?QTc by EKG 5/5: 478 ms as communicated by RN (do not see the EKG in [facility] yet).  This was done to check QTc before ordering some Zofran for nausea. ? ?Body mass index is 20.22 kg/m?. ? ? ?DVT prophylaxis: SCD's Start: 04/13/22 1348 ?Place and maintain  sequential compression device Start: 04/12/22 1440  Eliquis on hold for procedure.   ?   Code Status: Full Code:  ?Family Communication: None at bedside ?Disposition:  ?Status is: Inpatient ??  DC home 5/7. ?  ? ?Consultants:   ?Cardiology ?Vascular surgery ? ?Procedures:   ?None ? ?Antimicrobials:   ?As noted above ? ? ?Subjective:  ?Overall feels better than yesterday.  No further nausea.  No vomiting.  Tolerating diet.  Right-sided neck and upper chest soreness slowly improving.  Frustrated that she is unable to get up and move because of PureWick and IV Lasix.  Wondering why she cannot go back on oral Lasix sooner. ? ?Objective:  ? ?Vitals:  ? 04/14/22 2059 04/15/22 0459 04/15/22 0737 04/15/22 1206  ?BP: 113/62 (!) 106/53 (!) 110/55 (!) 102/54  ?Pulse: (!) 58 (!) 59 77 (!) 56  ?Resp: 15 16 (!) 77 15  ?Temp: 98.3 ?F (36.8 ?C) 98.4 ?F (36.9 ?C) 98.9 ?F (37.2 ?C) 98.3 ?F (36.8 ?C)  ?TempSrc: Oral Oral Oral Oral  ?SpO2: 99% 100% 97% 98%  ?Weight:  45.4 kg    ?Height:      ? ? ?General exam: Middle-age female, small built and frail sitting up uncomfortably in bed attempting to eat breakfast. ?Neck: Right side of neck procedure site without acute findings, decreasing swelling. ?Respiratory system: Clear to auscultation.  No increased work of breathing. ?Cardiovascular system: S1 & S2 heard, RRR. No JVD, murmurs, rubs, gallops or clicks. No pedal edema.  Telemetry personally reviewed: Sinus rhythm. ?Gastrointestinal system: Abdomen is nondistended, soft and nontender. No organomegaly or masses felt. Normal bowel sounds heard. ?Central nervous system: Alert and oriented. No focal neurological deficits. ?Extremities: Symmetric 5 x 5 power. ?Skin: No rashes, lesions or ulcers ?Psychiatry: Judgement and insight appear normal. Mood & affect appropriate.  ? ?Data Reviewed:   ?I have personally reviewed following labs and imaging studies ? ?CBC: ?Recent Labs  ?Lab 04/09/22 ?0700 04/10/22 ?9735 04/13/22 ?3299 04/14/22 ?0129 04/15/22 ?0404  ?WBC 12.8*   < > 10.0 11.4* 9.6  ?NEUTROABS 9.8*  --   --   --   --   ?HGB  15.5*   < > 15.0 12.1 11.6*  ?HCT 48.4*   < > 46.8* 38.9 36.3  ?MCV 98.8   < > 94.7 96.5 95.0  ?PLT 173   < > 189 155 168  ? < > = values in this interval not displayed.  ? ?Basic Metabolic Panel: ?Recent Labs  ?Lab 04/10/22 ?0014 04/10/22 ?1135 04/11/22 ?2426 04/11/22 ?8341 04/12/22 ?9622 04/13/22 ?2979 04/14/22 ?0129 04/15/22 ?0404  ?NA 137   < >  --  138 137 136 136 136  ?K 4.2   < >  --  4.1 3.9 4.2 4.3 3.8  ?CL 103   < >  --  104 103 103 103 100  ?CO2 24   < >  --  24 25 23 24 25   ?GLUCOSE 121*   < >  --  121* 95 90 145* 104*  ?BUN 30*   < >  --  38* 41* 46* 40* 43*  ?CREATININE 1.17*   < >  --  1.29* 1.38* 1.09* 1.27* 1.39*  ?CALCIUM 9.4   < >  --  9.8 9.7 9.6 9.2 9.1  ?MG 2.2  --  2.3  --  2.1  --   --   --   ? < > = values in this interval not displayed.  ? ?Liver Function  Tests: ?Recent Labs  ?Lab 04/10/22 ?1135 04/11/22 ?0850 04/12/22 ?0252  ?AST 24 26 22   ?ALT 18 20 24   ?ALKPHOS 64 66 59  ?BILITOT 0.6 0.7 0.8  ?PROT 6.9 6.5 6.6  ?ALBUMIN 3.7 3.7 3.6  ? ?CBG: ?Recent Labs  ?Lab 04/15/22 ?0759 04/15/22 ?1209 04/15/22 ?1613  ?GLUCAP 100* 179* 229*  ? ?Microbiology Studies:  ? ?Recent Results (from the past 240 hour(s))  ?Resp Panel by RT-PCR (Flu A&B, Covid) Nasopharyngeal Swab     Status: None  ? Collection Time: 04/09/22  7:12 AM  ? Specimen: Nasopharyngeal Swab; Nasopharyngeal(NP) swabs in vial transport medium  ?Result Value Ref Range Status  ? SARS Coronavirus 2 by RT PCR NEGATIVE NEGATIVE Final  ?  Comment: (NOTE) ?SARS-CoV-2 target nucleic acids are NOT DETECTED. ? ?The SARS-CoV-2 RNA is generally detectable in upper respiratory ?specimens during the acute phase of infection. The lowest ?concentration of SARS-CoV-2 viral copies this assay can detect is ?138 copies/mL. A negative result does not preclude SARS-Cov-2 ?infection and should not be used as the sole basis for treatment or ?other patient management decisions. A negative result may occur with  ?improper specimen collection/handling, submission  of specimen other ?than nasopharyngeal swab, presence of viral mutation(s) within the ?areas targeted by this assay, and inadequate number of viral ?copies(<138 copies/mL). A negative result must be combine

## 2022-04-15 NOTE — Progress Notes (Signed)
? ? Advanced Heart Failure Rounding Note ? ?PCP-Cardiologist: None  ? ?Subjective:   ?4/30 Midodrine stopped  ?5/1 Diuresed with IV lasix. Weight down 102-->101  ?5/4 S/P Barostim . Had some bleeding from internal jugular.  ? ?Diuresing with IV lasix. Weight down 2 pounds. Breathing better. No orthopnea or PND. No CP ? ?Neck and face still sore but improving ? ?Objective:   ?Weight Range: ?45.4 kg ?Body mass index is 20.22 kg/m?.  ? ?Vital Signs:   ?Temp:  [97.7 ?F (36.5 ?C)-98.9 ?F (37.2 ?C)] 98.9 ?F (37.2 ?C) (05/06 1751) ?Pulse Rate:  [57-77] 77 (05/06 0737) ?Resp:  [14-77] 77 (05/06 0737) ?BP: (106-117)/(53-63) 110/55 (05/06 0737) ?SpO2:  [97 %-100 %] 97 % (05/06 0737) ?Weight:  [45.4 kg] 45.4 kg (05/06 0459) ?Last BM Date : 04/14/22 ? ?Weight change: ?Filed Weights  ? 04/13/22 0428 04/14/22 0443 04/15/22 0459  ?Weight: 44.9 kg 46.4 kg 45.4 kg  ? ? ?Intake/Output:  ? ?Intake/Output Summary (Last 24 hours) at 04/15/2022 1110 ?Last data filed at 04/15/2022 0503 ?Gross per 24 hour  ?Intake --  ?Output 1400 ml  ?Net -1400 ml  ? ?  ?Physical Exam  ?  ?General:  Sitting up in bed No resp difficulty ?HEENT: normal + R face and neck swelling incisions ok  ?Neck: supple. JVP 6-7 Carotids 2+ bilat; no bruits. No lymphadenopathy or thryomegaly appreciated. ?Cor: PMI nondisplaced. Regular rate & rhythm. No rubs, gallops or murmurs. ?Lungs: clear ?Abdomen: soft, nontender, nondistended. No hepatosplenomegaly. No bruits or masses. Good bowel sounds. ?Extremities: no cyanosis, clubbing, rash, edema ?Neuro: alert & orientedx3, cranial nerves grossly intact. moves all 4 extremities w/o difficulty. Affect pleasant ? ? ?Telemetry  ?  ?Sr 22s Personally reviewed ? ?Labs  ?  ?CBC ?Recent Labs  ?  04/14/22 ?0129 04/15/22 ?0404  ?WBC 11.4* 9.6  ?HGB 12.1 11.6*  ?HCT 38.9 36.3  ?MCV 96.5 95.0  ?PLT 155 168  ? ? ?Basic Metabolic Panel ?Recent Labs  ?  04/14/22 ?0129 04/15/22 ?0404  ?NA 136 136  ?K 4.3 3.8  ?CL 103 100  ?CO2 24 25   ?GLUCOSE 145* 104*  ?BUN 40* 43*  ?CREATININE 1.27* 1.39*  ?CALCIUM 9.2 9.1  ? ? ?Liver Function Tests ?No results for input(s): AST, ALT, ALKPHOS, BILITOT, PROT, ALBUMIN in the last 72 hours. ? ?No results for input(s): LIPASE, AMYLASE in the last 72 hours. ?Cardiac Enzymes ?No results for input(s): CKTOTAL, CKMB, CKMBINDEX, TROPONINI in the last 72 hours. ? ?BNP: ?BNP (last 3 results) ?Recent Labs  ?  03/21/22 ?1400 03/24/22 ?1227 04/09/22 ?0700  ?BNP 1,514.7* 650.7* 2,367.1*  ? ? ? ?ProBNP (last 3 results) ?Recent Labs  ?  07/19/21 ?0945  ?PROBNP 3,162*  ? ? ? ? ?D-Dimer ?No results for input(s): DDIMER in the last 72 hours. ? ?Hemoglobin A1C ?No results for input(s): HGBA1C in the last 72 hours. ? ?Fasting Lipid Panel ?No results for input(s): CHOL, HDL, LDLCALC, TRIG, CHOLHDL, LDLDIRECT in the last 72 hours. ?Thyroid Function Tests ?No results for input(s): TSH, T4TOTAL, T3FREE, THYROIDAB in the last 72 hours. ? ?Invalid input(s): FREET3 ? ?Other results: ? ? ?Imaging  ? ? ?No results found. ? ? ?Medications:   ? ? ?Scheduled Medications: ? apixaban  5 mg Oral BID  ? atorvastatin  80 mg Oral q1800  ? dapagliflozin propanediol  10 mg Oral Daily  ? furosemide  80 mg Intravenous BID  ? insulin aspart  0-5 Units Subcutaneous QHS  ?  insulin aspart  0-9 Units Subcutaneous TID WC  ? ivabradine  7.5 mg Oral BID WC  ? pantoprazole  40 mg Oral Daily  ? predniSONE  40 mg Oral Q breakfast  ? sodium chloride flush  3 mL Intravenous Q12H  ? spironolactone  12.5 mg Oral Daily  ? ? ?Infusions: ? ? ? ?PRN Medications: ?acetaminophen **OR** acetaminophen, albuterol, benzonatate, guaiFENesin-dextromethorphan, hydrALAZINE, labetalol, ondansetron (ZOFRAN) IV, oxyCODONE-acetaminophen ? ? ? ?Patient Profile  ?Adaliz Dobis is a 61 y.o. female with a history of PAF,  previous smoker quit in 2015, hypertension, previous small cell lung cancer treated with chemo, chest XRT and prophylactic brain radiation in 2015, CAD s/p  CABG and chronic systolic HF EF ~76%. ? ?Admitted with HF + COPD ? ? ?Assessment/Plan  ?Acute on Chronic Systolic Heart Failure Due to ICM  ?- Echo 09/04/18 ([facility]) LVEF 20%, Moderate AI, Mild MR, Mild TR, Severe LAE, RV mildly decreased.  ?- Echo 02/12/19: EF 25-30%, RV mildly reduced ?- Echo 11/21 EF 20-25% RV mild HK. ?- Echo 2022 EF < 20% RV normal ?- Echo 1/23 EF 20-25% RV ok  ?- Has [employer] single chamber ICD  ?- Volume status improved with IV diuresis. Continue today ?- Failed Entresto due to hyperkalemia   ?- Continue Farxiga 10 mg daily  ?- Continue spiro 12.5 mg daily ?- Continue corlanor 7.5 mg bid. ?- No b-blocker due to intolerance with low output and bad COPD with frequent flares ?- Not a candidate for advanced therapies with her degree of lung disease.   ?- Now with Barostim in place ? ?2. Acute COPDE ?- management per primary  ?- on Doxy, prednisone and nebs  ?- Improved. No wheezing today ?  ?3. Lactic Acidosis, Resolved   ?- transient hypotension on admit w/ initial LA of 6.2-->1.1 ?- PCT negative, <0.10 ? ?4. Acute on Chronic Hypoxic Respiratory Failure  ?- Multifactorial, former smoker, h/o small cell lung cancer treated with chemo, chest XRT, COPD and HF ?- acute exacerbation 2/2 ACOPDE + a/c CHF  ?- CXR w/o infiltrate. + small pleural effusions. PCT negative  ?- COPD management and diuresis per above  ?- Improved. Stable on 2 liters.  ?  ?5. Recurrent pleural effusions s/p pleurodesis ?- Resolved. Admit CXR ok ?  ?6. PAF ?- CHA2DS2/VASc is at least 4. (CHF, Vasc disease, HTN, Female).   ?- She has history of Afib RVR in the past in the setting of her Lung CA.  ?- Previously on Xarelto but stopped due to hemoptysis.  ?- Started on Eliquis in 12/22 due to renal infarct. ?- Holding Eliquis for Barostim implant . Restart when ok with Vascular. Hopefully tomorrow  ?  ?7. H/o R renal infarct ?- 12/22 ?- Thought to be cardioembolic ?- holding Eliquis per above. Resume once cleared by [facility]  ?   ?8. H/o  SCLC: s/p treatment 2015. Lost to f/u since 04/2015.  ?- Chest CT 02/19/18 with mass-like consolidation in R hilum concerning for recurrent tumor.  ?- Repeat CT 03/27/2018 -No definite findings of locally recurrent tumor in the right lung. 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 Continued chest CT surveillance is advised in 3-6 months. ?- S/p thoracentesis 04/2018. Cytology negative for malignancy.  ?- CTA 11/19 stable scarring in the right hilum consistent with emphysema. Bronchoscopy on 11/15, no obvious source of bleeding ? ?9. S/p Barostim Implant ?- post operative management per [facility]VS ?- appreciate Dr. Stephens Shire  assistance  ?- Restart eliquis when ok with Dr Trula Slade.  ? ?Length of Stay: 6 ? ?Glori Bickers, MD  ?04/15/2022, 11:10 AM ? ?Advanced Heart Failure Team ?Pager [phone] (M-F; 7a - 5p)  ?Please contact [facility] for night-coverage after hours (5p -7a ) and weekends on [email or web address] ? ? ?  ? ? ?

## 2022-04-16 DIAGNOSIS — J9621 Acute and chronic respiratory failure with hypoxia: Secondary | ICD-10-CM | POA: Diagnosis not present

## 2022-04-16 DIAGNOSIS — I480 Paroxysmal atrial fibrillation: Secondary | ICD-10-CM | POA: Diagnosis not present

## 2022-04-16 DIAGNOSIS — I5023 Acute on chronic systolic (congestive) heart failure: Secondary | ICD-10-CM | POA: Diagnosis not present

## 2022-04-16 DIAGNOSIS — E872 Acidosis, unspecified: Secondary | ICD-10-CM

## 2022-04-16 DIAGNOSIS — J441 Chronic obstructive pulmonary disease with (acute) exacerbation: Secondary | ICD-10-CM | POA: Diagnosis not present

## 2022-04-16 LAB — BASIC METABOLIC PANEL
Anion gap: 8 (ref 5–15)
BUN: 40 mg/dL — ABNORMAL HIGH (ref 8–23)
CO2: 26 mmol/L (ref 22–32)
Calcium: 9.4 mg/dL (ref 8.9–10.3)
Chloride: 102 mmol/L (ref 98–111)
Creatinine, Ser: 1.16 mg/dL — ABNORMAL HIGH (ref 0.44–1.00)
GFR, Estimated: 54 mL/min — ABNORMAL LOW (ref >60)
Glucose, Bld: 147 mg/dL — ABNORMAL HIGH (ref 70–99)
Potassium: 4.3 mmol/L (ref 3.5–5.1)
Sodium: 136 mmol/L (ref 135–145)

## 2022-04-16 LAB — GLUCOSE, CAPILLARY
Glucose-Capillary: 101 mg/dL — ABNORMAL HIGH (ref 70–99)
Glucose-Capillary: 118 mg/dL — ABNORMAL HIGH (ref 70–99)

## 2022-04-16 MED ORDER — ATORVASTATIN CALCIUM 80 MG PO TABS: 80.0000 mg | ORAL_TABLET | Freq: Every day | ORAL | 1 refills | Status: DC

## 2022-04-16 MED ORDER — ACETAMINOPHEN 325 MG PO TABS: 650.0000 mg | ORAL_TABLET | Freq: Four times a day (QID) | ORAL | Status: DC | PRN

## 2022-04-16 MED ORDER — IPRATROPIUM-ALBUTEROL 0.5-2.5 (3) MG/3ML IN SOLN: 3.0000 mL | RESPIRATORY_TRACT | Status: DC | PRN

## 2022-04-16 NOTE — Progress Notes (Signed)
? ? Advanced Heart Failure Rounding Note ? ?PCP-Cardiologist: None  ? ?Subjective:   ?4/30 Midodrine stopped  ?5/1 Diuresed with IV lasix. Weight down 102-->101  ?5/4 S/P Barostim . Had some bleeding from internal jugular.  ? ?Diuresing with IV lasix. Weight down 2 more pounds. Cr 1.4 -> 1.16 ? ?Denies SOB, orthopnea or PND. Off pain meds.  ? ? ?Objective:   ?Weight Range: ?45.4 kg ?Body mass index is 20.22 kg/m?.  ? ?Vital Signs:   ?Temp:  [98 ?F (36.7 ?C)-98.4 ?F (36.9 ?C)] 98.4 ?F (36.9 ?C) (05/07 0500) ?Pulse Rate:  [52-58] 58 (05/07 0500) ?Resp:  [11-15] 12 (05/07 0500) ?BP: (102-118)/(54-63) 115/60 (05/07 0500) ?SpO2:  [97 %-98 %] 98 % (05/07 0500) ?Last BM Date : 04/14/22 ? ?Weight change: ?Filed Weights  ? 04/13/22 0428 04/14/22 0443 04/15/22 0459  ?Weight: 44.9 kg 46.4 kg 45.4 kg  ? ? ?Intake/Output:  ? ?Intake/Output Summary (Last 24 hours) at 04/16/2022 1049 ?Last data filed at 04/16/2022 0500 ?Gross per 24 hour  ?Intake --  ?Output 1300 ml  ?Net -1300 ml  ? ?  ?Physical Exam  ?  ?General:  Sitting up in bed. No resp difficulty ?HEENT: normal R face swelling improved ?Neck: supple. JVP6-7. Incision looks good Carotids 2+ bilat; no bruits. No lymphadenopathy or thryomegaly appreciated. ?Cor: PMI nondisplaced. Regular rate & rhythm. No rubs, gallops or murmurs. ?Lungs: clear ?Abdomen: soft, nontender, nondistended. No hepatosplenomegaly. No bruits or masses. Good bowel sounds. ?Extremities: no cyanosis, clubbing, rash, edema ?Neuro: alert & orientedx3, cranial nerves grossly intact. moves all 4 extremities w/o difficulty. Affect pleasant ? ? ?Telemetry  ?  ?SR 50-60s Personally reviewed ? ?Labs  ?  ?CBC ?Recent Labs  ?  04/14/22 ?0129 04/15/22 ?0404  ?WBC 11.4* 9.6  ?HGB 12.1 11.6*  ?HCT 38.9 36.3  ?MCV 96.5 95.0  ?PLT 155 168  ? ? ?Basic Metabolic Panel ?Recent Labs  ?  04/15/22 ?0404 04/16/22 ?0106  ?NA 136 136  ?K 3.8 4.3  ?CL 100 102  ?CO2 25 26  ?GLUCOSE 104* 147*  ?BUN 43* 40*  ?CREATININE 1.39* 1.16*   ?CALCIUM 9.1 9.4  ? ? ?Liver Function Tests ?No results for input(s): AST, ALT, ALKPHOS, BILITOT, PROT, ALBUMIN in the last 72 hours. ? ?No results for input(s): LIPASE, AMYLASE in the last 72 hours. ?Cardiac Enzymes ?No results for input(s): CKTOTAL, CKMB, CKMBINDEX, TROPONINI in the last 72 hours. ? ?BNP: ?BNP (last 3 results) ?Recent Labs  ?  03/21/22 ?1400 03/24/22 ?1227 04/09/22 ?0700  ?BNP 1,514.7* 650.7* 2,367.1*  ? ? ? ?ProBNP (last 3 results) ?Recent Labs  ?  07/19/21 ?0945  ?PROBNP 3,162*  ? ? ? ? ?D-Dimer ?No results for input(s): DDIMER in the last 72 hours. ? ?Hemoglobin A1C ?No results for input(s): HGBA1C in the last 72 hours. ? ?Fasting Lipid Panel ?No results for input(s): CHOL, HDL, LDLCALC, TRIG, CHOLHDL, LDLDIRECT in the last 72 hours. ?Thyroid Function Tests ?No results for input(s): TSH, T4TOTAL, T3FREE, THYROIDAB in the last 72 hours. ? ?Invalid input(s): FREET3 ? ?Other results: ? ? ?Imaging  ? ? ?No results found. ? ? ?Medications:   ? ? ?Scheduled Medications: ? apixaban  5 mg Oral BID  ? atorvastatin  80 mg Oral q1800  ? dapagliflozin propanediol  10 mg Oral Daily  ? furosemide  80 mg Intravenous BID  ? insulin aspart  0-5 Units Subcutaneous QHS  ? insulin aspart  0-9 Units Subcutaneous TID WC  ? ivabradine  7.5 mg Oral BID WC  ? pantoprazole  40 mg Oral Daily  ? predniSONE  40 mg Oral Q breakfast  ? sodium chloride flush  3 mL Intravenous Q12H  ? spironolactone  12.5 mg Oral Daily  ? ? ?Infusions: ? ? ? ?PRN Medications: ?acetaminophen **OR** acetaminophen, albuterol, benzonatate, guaiFENesin-dextromethorphan, hydrALAZINE, labetalol, ondansetron (ZOFRAN) IV, oxyCODONE-acetaminophen ? ? ? ?Patient Profile  ?Brittany [name] is a 61 y.o. female with a history of PAF,  previous smoker quit in 2015, hypertension, previous small cell lung cancer treated with chemo, chest XRT and prophylactic brain radiation in 2015, CAD s/p CABG and chronic systolic HF EF ~29%. ? ?Admitted with HF +  COPD ? ? ?Assessment/Plan  ?Acute on Chronic Systolic Heart Failure Due to ICM  ?- Echo 09/04/18 ([facility]) LVEF 20%, Moderate AI, Mild MR, Mild TR, Severe LAE, RV mildly decreased.  ?- Echo 02/12/19: EF 25-30%, RV mildly reduced ?- Echo 11/21 EF 20-25% RV mild HK. ?- Echo 2022 EF < 20% RV normal ?- Echo 1/23 EF 20-25% RV ok  ?- Has [employer] single chamber ICD  ?- Volume status improved with IV diuresis.Can switch back to home diuretics (torsemide 40 daily) ?- Failed Entresto due to hyperkalemia   ?- Continue Farxiga 10 mg daily  ?- Continue spiro 12.5 mg daily ?- Continue corlanor 7.5 mg bid. ?- No b-blocker due to intolerance with low output and bad COPD with frequent flares ?- Not a candidate for advanced therapies with her degree of lung disease.   ?- Now with Barostim in place. Will titrate as outpatient ? ?2. Acute COPDE ?- management per primary  ?- Treated with Doxy, prednisone and nebs  ?- Resolved  ? ?3. Lactic Acidosis, Resolved   ?- transient hypotension on admit w/ initial LA of 6.2-->1.1 ?- PCT negative, <0.10 ? ?4. Acute on Chronic Hypoxic Respiratory Failure  ?- Multifactorial, former smoker, h/o small cell lung cancer treated with chemo, chest XRT, COPD and HF ?- acute exacerbation 2/2 ACOPDE + a/c CHF  ?- CXR w/o infiltrate. + small pleural effusions. PCT negative  ?- COPD management and diuresis per above  ?- back to baseline  ?  ?5. Recurrent pleural effusions s/p pleurodesis ?- Resolved. Admit CXR ok ?  ?6. PAF ?- CHA2DS2/VASc is at least 4. (CHF, Vasc disease, HTN, Female).   ?- She has history of Afib RVR in the past in the setting of her Lung CA.  ?- Previously on Xarelto but stopped due to hemoptysis.  ?- Started on Eliquis in 12/22 due to renal infarct. ?- Eliquis restarted for Barostim implant . Now restarted ?  ?7. H/o R renal infarct ?- 12/22 ?- Thought to be cardioembolic ?- back on Eliquis  ?   ?8. 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  ?- Chest CT 02/19/18 with mass-like  consolidation in R hilum concerning for recurrent tumor.  ?- Repeat CT 03/27/2018 -No definite findings of locally recurrent tumor in the right lung. 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 Continued chest CT surveillance is advised in 3-6 months. ?- S/p thoracentesis 04/2018. Cytology negative for malignancy.  ?- CTA 11/19 stable scarring in the right hilum consistent with emphysema. Bronchoscopy on 11/15, no obvious source of bleeding ? ?9. S/p Barostim Implant ?- site stable ? ?Nambe for d/c from my perspective. Will arrange HF f/u.  ? ?Length of Stay: 7 ? ?Glori Bickers, MD  ?04/16/2022, 10:49 AM ? ?  Advanced Heart Failure Team ?Pager [phone] (M-F; 7a - 5p)  ?Please contact [facility] for night-coverage after hours (5p -7a ) and weekends on [email or web address] ? ? ?  ? ? ?

## 2022-04-16 NOTE — Progress Notes (Signed)
Pt ambulated 180 ft in hall. O2 sat 97-99% on room air. No complaints. ?

## 2022-04-16 NOTE — Discharge Summary (Signed)
Physician Discharge Summary  ?Brittany [name] QIO:[id number] DOB: [birth year] ? ?PCP: Scifres, Dorothy, PA-C (Inactive) ? ?Admitted from: Home ?Discharged to: Home ? ?Admit date: 04/09/2022 ?Discharge date: 04/16/2022 ? ?Recommendations for Outpatient Follow-up:  ? ? Follow-up Information   ? ? Scifres, Dorothy, [employer]. Schedule an appointment as soon as possible for a visit.   ?Specialty: Physician Assistant ?Contact information: ?[address] ?[address] ?[place] [address] ?[phone] ? ? ?  ?  ? ? [facility] Follow up on 04/25/2022.   ?Specialty: Cardiology ?Why: At 1200.  To be seen with repeat labs (CBC & CMP). ?Contact information: ?[address] ?[phone] mc ?[place] ?[phone] ? ?  ?  ? ? Curt Bears, MD. Schedule an appointment as soon as possible for a visit.   ?Specialty: Oncology ?Contact information: ?[address] ?[place] [address] ?[phone] ? ? ?  ?  ? ?  ?  ? ?  ? ? ?Home Health: None ?  ? ?Equipment/Devices: None ?  ? ?Discharge Condition: Improved and stable. ?  Code Status: Full Code ?Diet recommendation:  ?Discharge Diet Orders (From admission, onward)  ? ?  Start     Ordered  ? 04/16/22 0000  Diet - low sodium heart healthy       ? 04/16/22 1209  ? ?  ?  ? ?  ?  ? ?Discharge Diagnoses:  ?Principal Problem: ?  Acute on chronic respiratory failure with hypoxia (HCC) ?Active Problems: ?  Acute on chronic systolic CHF (congestive heart failure) ([place]) ?  COPD exacerbation ([place]) ?  Hypotension ?  AF (paroxysmal atrial fibrillation) ([place]) ?  Type 2 diabetes mellitus with stage 3a chronic kidney disease, without long-term current use of insulin ([place]) ?  Small cell lung cancer ([place]) ?  Recurrent pleural effusion on right ?  Lactic acidosis ?  Prolonged QT interval ? ? ?Brief Hospital Course: ?61 year old female, medical history significant for PAF, former tobacco use, HTN, previous small cell  lung cancer treated with chemo, chest XRT and prophylactic brain radiation, CAD s/p CABG, ICM, chronic systolic CHF, AICD, COPD, chronic respiratory failure with hypoxia on as needed home oxygen, recurrent pleural effusions s/p pleurodesis, right renal infarct, type II DM, CKD stage IIIb, presented via EMS to ED on 4/30 with SOB on CPAP by EMS.  Admitted for acute on chronic respiratory failure with hypoxia due to suspected decompensated CHF and COPD exacerbation.  Advanced HF team consulted, diuresed with IV Lasix.  Clinically improved.  S/p Barostim implant by [facility] on 5/4. ? ?Assessment and Plan: ?* Acute on chronic respiratory failure with hypoxia (HCC) ?Patient confirms that she is on only as needed home oxygen. ?Required CPAP by EMS on arrival. ?Multifactorial due to decompensated CHF and COPD exacerbation, treated as below. ?Hypoxia resolved.  Saturating in the high 90s on room air. ?Reassess home oxygen requirement prior to discharge. ? ?Acute on chronic systolic CHF (congestive heart failure) ([place]) ?Ischemic cardiomyopathy, s/p AICD ?Advanced heart failure cardiology team following. ?2D echo 1/23: LVEF 20-25% ?Treated with IV Lasix since 4/30. Clinically appears euvolemic and creatinine also has bumped up, Lasix had been DC'd. ?Continue Farxiga 10 Mg daily, spironolactone 12.5 Mg daily, Corlanor 7.5 Mg twice daily. ?Not candidate for Entresto due to prior hyperkalemia, beta-blockers due to intolerance from low output and COPD with frequent flares or for advanced therapies due to her advanced lung disease. ?S/p Barostim implant by [facility] on 5/4. ?Post  Barostim, volume up, cardiology reinitiated IV Lasix 80 mg twice daily.  Diuresed appropriately.  Clinically euvolemic.  Serum creatinine 1.16/stable. ?As per cardiology follow-up, can switch back to home diuretics, torsemide 40 Mg daily and cleared for discharge home.  Has follow-up appointment with them on 5/16.  To be seen with repeat labs.  They will titrate  Barostim as outpatient. ? ?COPD exacerbation ([place]) ?Completed a course of doxycycline and prednisone. ?COPD exacerbation resolved. ?Continue prior home as needed albuterol inhaler or DuoNeb's.  ? ?Hypotension ?Transient on admission. ?Procalcitonin negative. ?Lactate improved from 6.2-1.1. ?Antibiotics narrowed from ceftriaxone to doxycycline. ?Sepsis ruled out. ? ?AF (paroxysmal atrial fibrillation) ([place]) ?CHA2DS2-VASc score at least 4 ?Previously on Xarelto, stopped due to hemoptysis ?On Eliquis PTA, started in 12/22 due to renal infarct, suspected embolic ?Eliquis was held for [facility] implant 5/3.  Resumed Eliquis 5/5 PM after confirming with Dr. Trula Slade.  Continue Eliquis. ? ?Type 2 diabetes mellitus with stage 3a chronic kidney disease, without long-term current use of insulin ([place]) ?A1c 5.9 on 04/09/2022 ?Labile CBGs but reasonable, while on prednisone in the hospital.  No prednisone at discharge. ?Monitor CBGs periodically at home. ? ?Small cell lung cancer ([place]) ?S/p chemotherapy, XRT with prophylactic cranial irradiation ?Reportedly lost to follow-up since 04/2015 ?Needs to reestablish with oncology as outpatient for follow-up.  Will forward discharge summary to Dr. Julien Nordmann. ? ?Recurrent pleural effusion on right ?Resolved. ? ?Lactic acidosis ?Lactate peaked to 6.2. ?May be related to respiratory distress and decompensated CHF.  Sepsis ruled out. ?PCT negative. ?Resolved. ? ?Prolonged QT interval ?Minimize QT prolonging medications. ?QTc 519 ms by EKG 4/30 but patient has BBB morphology and so not sure if this is accurate. ?Has AICD. ?QTc by EKG 5/5: 478 ms as communicated by RN (do not see the EKG in [facility] yet).  This was done to check QTc before ordering some Zofran for nausea. ? ? ?Anemia: Unclear etiology.  Hemoglobin was in the 15 g range in mid April, 13-15 g range earlier this admission, then dropped to 12.1 on 5/5 and 11.6 on 5/6 in the absence of overt bleeding.  Close follow-up of CBC as outpatient  in the clinic ? ?Body mass index is 20.22 kg/m?. ? ?Consultations: ?Cardiology ?Vascular surgery ? ?Procedures: ?As noted above ? ?Discharge Instructions ?Discharge Instructions   ? ? (HEART FAILURE PATIENTS) Call MD:  Anytime you have any of the following symptoms: 1) 3 pound weight gain in 24 hours or 5 pounds in 1 week 2) shortness of breath, with or without a dry hacking cough 3) swelling in the hands, feet or stomach 4) if you have to sleep on extra pillows at night in order to breathe.   Complete by: As directed ?  ? Call MD for:  difficulty breathing, headache or visual disturbances   Complete by: As directed ?  ? Call MD for:  extreme fatigue   Complete by: As directed ?  ? Call MD for:  persistant dizziness or light-headedness   Complete by: As directed ?  ? Call MD for:  persistant nausea and vomiting   Complete by: As directed ?  ? Call MD for:  redness, tenderness, or signs of infection (pain, swelling, redness, odor or green/yellow discharge around incision site)   Complete by: As directed ?  ? Call MD for:  severe uncontrolled pain   Complete by: As directed ?  ? Call MD for:  temperature >100.4   Complete by: As directed ?  ? Diet -  low sodium heart healthy   Complete by: As directed ?  ? Increase activity slowly   Complete by: As directed ?  ? No wound care   Complete by: As directed ?  ? ?  ? ?  ?Medication List  ?  ? ?STOP taking these medications   ? ?potassium chloride SA 20 MEQ tablet ?Commonly known as: KLOR-CON M ?  ? ?  ? ?TAKE these medications   ? ?acetaminophen 325 MG tablet ?Commonly known as: TYLENOL ?Take 2 tablets (650 mg total) by mouth every 6 (six) hours as needed for mild pain, moderate pain or fever (or Fever >/= 101). ?  ?albuterol 108 (90 Base) MCG/ACT inhaler ?Commonly known as: VENTOLIN HFA ?Inhale 2 puffs into the lungs every 6 (six) hours as needed for wheezing or shortness of breath. ?What changed: Another medication with the same name was removed. Continue taking this  medication, and follow the directions you see here. ?  ?apixaban 5 MG Tabs tablet ?Commonly known as: ELIQUIS ?Take 1 tablet (5 mg total) by mouth 2 (two) times daily. ?What changed: when to take this ?  ?atorva

## 2022-04-17 ENCOUNTER — Ambulatory Visit (INDEPENDENT_AMBULATORY_CARE_PROVIDER_SITE_OTHER): Payer: BC Managed Care – PPO

## 2022-04-17 ENCOUNTER — Telehealth: Payer: Self-pay

## 2022-04-17 DIAGNOSIS — I5022 Chronic systolic (congestive) heart failure: Secondary | ICD-10-CM

## 2022-04-17 DIAGNOSIS — Z95810 Presence of automatic (implantable) cardiac defibrillator: Secondary | ICD-10-CM | POA: Diagnosis not present

## 2022-04-17 NOTE — Telephone Encounter (Signed)
Remote ICM transmission received.  Attempted call to patient regarding ICM remote transmission and voice mail has not been set up.  ? ?

## 2022-04-19 NOTE — Progress Notes (Signed)
EPIC Encounter for ICM Monitoring ? ?Patient Name: Kelsa Jaworowski is a 61 y.o. female ?Date: 04/19/2022 ?Primary Care Physican: Traci Sermon, PA-C ?Primary Cardiologist: [place] ?Electrophysiologist: [place] ?03/24/2022 Weight: 101 lbs ?04/19/2022 Weight: 101 lbs ?  ?Spoke with patient and heart failure questions reviewed.  Hospitalized 4/30-5/7 and feeling much better since discharge.  Barostim procedure completed during hospitalization. ?  ?Optivol Thoracic impedance normal since 5/7 discharge.  Barostim implant. ?  ?Prescribed: ?Torsemide 20 mg Take 40 mg by mouth daily. Takes 20 mg extra if needed for fluid ?Metolazone 2.5 mg take by mouth as needed for fluid  ?Spironolactone 25 mg take 0.5 tablet by mouth daily ?Farxiga 10 mg take 1 tablet daily ?  ?Labs: ?04/16/2022 Creatinine 1.16, BUN 40, Potassium 4.3, Sodium 136, GFR 54 ?04/15/2022 Creatinine 1.39, BUN 43, Potassium 3.8, Sodium 136, GFR 43  ?04/14/2022 Creatinine 1.27, BUN 40, Potassium 4.3, Sodium 136, GFR 48  ?04/13/2022 Creatinine 1.09, BUN 46, Potassium 4.2, Sodium 136, GFR 58  ?04/12/2022 Creatinine 1.38, BUN 41, Potassium 3.9, Sodium 137, GFR 44  ?04/11/2022 Creatinine 1.29, BUN 38, Potassium 4.1, Sodium 138, GFR 47  ?04/10/2022 Creatinine 1.17, BUN 29, Potassium 4.3, Sodium 138, GFR 53  ?04/09/2022 Creatinine 1.20, BUN 21, Potassium 3.4, Sodium 138, GFR 52  ?A complete set of results can be found in Results Review. ?  ?Recommendations:   No changes and encouraged to call if experiencing any fluid symptoms. ?  ?Follow-up plan: ICM clinic phone appointment on 04/25/2022 to recheck fluid levels.   91 day device clinic remote transmission 06/21/2022.   ?  ?EP/Cardiology Office Visits:  05/01/2022 with Oda Kilts, PA for post barostim implant.  07/24/2022 with Dr [name]. ?  ?Copy of ICM check sent to Dr. Rayann Heman.  ? ?3 month ICM trend: 04/18/2022. ? ? ? ?12-14 Month ICM trend:  ? ? ? ?[name], RN ?04/19/2022 ?12:29 PM ? ?

## 2022-04-24 ENCOUNTER — Telehealth (HOSPITAL_COMMUNITY): Payer: Self-pay

## 2022-04-24 NOTE — Telephone Encounter (Signed)
Called to confirm/remind patient of their appointment at the [facility] on 04/25/22.  ? ?Patient reminded to bring all medications and/or complete list. ? ?Confirmed patient has transportation. Gave directions, instructed to utilize [facility] parking. ? ?Confirmed appointment prior to ending call.  ? ?

## 2022-04-24 NOTE — Telephone Encounter (Signed)
error 

## 2022-04-24 NOTE — Addendum Note (Signed)
Addendum  created 04/24/22 0732 by Lynda Rainwater, MD  ? Clinical Note Signed, Intraprocedure Blocks edited, SmartForm saved  ?  ?

## 2022-04-24 NOTE — Progress Notes (Deleted)
Electrophysiology Office Note Date: 04/24/2022  ID:  Brittany [name], DOB 1961/10/06, MRN [id number]  PCP: Traci Sermon, PA-C Primary Cardiologist: None Electrophysiologist: Thompson Grayer, MD   CC: Routine ICD follow-up  Brittany [name] is a 61 y.o. female seen today for Thompson Grayer, MD for post hospital follow up and post barostim implant.   Admitted 4/30 - 04/16/2022 with hypotension and acute on chronic CHF. AHF team followed and were able to arrange inpatient Barostim implantation.   routine electrophysiology followup and to discuss barostim.  Since last being seen in our clinic the patient reports doing OK. She had recent admission for CHF 04/08/21. Continues to have intermittent chest pain at times. Followed closely by Dr. [name]. Interested in barostim. Not research candidate with restrictive lung disease. She has mild SOB with moderate exertion, but mostly avoids more than mild exertion. She is able to walk 2-3 blocks at pace on a good day if it's not too hot. On bad days, it takes her "all day to change her sheets".  Mostly she gets help around the house.   She is not vaccinated from [place] and avoids exposure by mostly staying at home.   Device History: Medtronic Single Chamber ICD implanted 2019 for Chronic systolic CHF.   Past Medical History:  Diagnosis Date   Acute respiratory failure ([place]) 95/62/1308   Acute systolic congestive heart failure ([place]) 02/03/2018   AICD (automatic cardioverter/defibrillator) present    Allergy    Anxiety    Asthma    DM2 (diabetes mellitus, type 2) ([place]) 10/20/2020   Hypertension    PAF (paroxysmal atrial fibrillation) ([place])    Presence of permanent cardiac pacemaker    Prophylactic measure 08/03/14-08/19/14   Prophyl. 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   LIMA to LAD, SVG to D1, SVG to OM1, EVH via right thigh with implantation of Impella LD LVAD via direct aortic approach   Small cell lung  cancer ([place]) 03/16/2014   Past Surgical History:  Procedure Laterality Date   BRONCHIAL BRUSHINGS  10/25/2020   Procedure: BRONCHIAL BRUSHINGS;  Surgeon: Collene Gobble, MD;  Location[facility] END[facility]COPY;  Service: Pulmonary;;   BRONCHIAL NEEDLE ASPIRATION BIOPSY  10/25/2020   Procedure: BRONCHIAL NEEDLE ASPIRATION BIOPSIES;  Surgeon: Collene Gobble, MD;  Location: [place] ENDOSCOPY;  Service: Pulmonary;;   CARDIAC DEFIBRILLATOR PLACEMENT  08/15/2018   MDT Visia AF MRI VR ICD implanted by Dr Loel Lofty for primary prevention of sudden   CESAREAN SECTION     CORONARY ARTERY BYPASS GRAFT N/A 02/03/2018   Procedure: CORONARY ARTERY BYPASS GRAFTING (CABG);  Surgeon: Rexene Alberts, MD;  Location: [place];  Service: Open Heart Surgery;  Laterality: N/A;  Time 3 using left internal mammary artery and endoscopically harvested right saphenous vein   CORONARY BALLOON ANGIOPLASTY N/A 02/03/2018   Procedure: CORONARY BALLOON ANGIOPLASTY;  Surgeon: Martinique, Peter M, MD;  Location: [place] CV LAB;  Service: Cardiovascular;  Laterality: N/A;   CORONARY STENT INTERVENTION N/A 02/12/2019   Procedure: CORONARY STENT INTERVENTION;  Surgeon: Wellington Hampshire, MD;  Location: [place] CV LAB;  Service: Cardiovascular;  Laterality: N/A;   CORONARY/GRAFT ACUTE MI REVASCULARIZATION N/A 02/03/2018   Procedure: Coronary/Graft Acute MI Revascularization;  Surgeon: Martinique, Peter M, MD;  Location: [place] CV LAB;  Service: Cardiovascular;  Laterality: N/A;   ENDOBRONCHIAL ULTRASOUND N/A 10/25/2020   Procedure: ENDOBRONCHIAL ULTRASOUND;  Surgeon: Collene Gobble, MD;  Location: [facility] ENDOSCOPY;  Service: Pulmonary;  Laterality: N/A;   FLEXIBLE BRONCHOSCOPY  10/25/2020   Procedure: FLEXIBLE BRONCHOSCOPY;  Surgeon: Collene Gobble, MD;  Location: [facility] ENDOSCOPY;  Service: Pulmonary;;   IABP INSERTION N/A 02/03/2018   Procedure: IABP Insertion;  Surgeon: Martinique, Peter M, MD;  Location: [place] CV LAB;  Service: Cardiovascular;   Laterality: N/A;   INTRAOPERATIVE TRANSESOPHAGEAL ECHOCARDIOGRAM N/A 02/03/2018   Procedure: INTRAOPERATIVE TRANSESOPHAGEAL ECHOCARDIOGRAM;  Surgeon: Rexene Alberts, MD;  Location: [place];  Service: Open Heart Surgery;  Laterality: N/A;   LEFT HEART CATH AND CORONARY ANGIOGRAPHY N/A 02/03/2018   Procedure: LEFT HEART CATH AND CORONARY ANGIOGRAPHY;  Surgeon: Martinique, Peter M, MD;  Location: [place] CV LAB;  Service: Cardiovascular;  Laterality: N/A;   LEFT HEART CATH AND CORS/GRAFTS ANGIOGRAPHY N/A 02/12/2019   Procedure: LEFT HEART CATH AND CORS/GRAFTS ANGIOGRAPHY;  Surgeon: Wellington Hampshire, MD;  Location: [place] CV LAB;  Service: Cardiovascular;  Laterality: N/A;   MEDIASTINOSCOPY N/A 03/11/2014   Procedure: MEDIASTINOSCOPY;  Surgeon: Melrose Nakayama, MD;  Location: [place];  Service: Thoracic;  Laterality: N/A;   PLACEMENT OF IMPELLA LEFT VENTRICULAR ASSIST DEVICE  02/03/2018   Procedure: PLACEMENT OF [place] LEFT VENTRICULAR ASSIST DEVICE LD;  Surgeon: Rexene Alberts, MD;  Location: [place];  Service: Open Heart Surgery;;   REMOVAL OF [place] LEFT VENTRICULAR ASSIST DEVICE N/A 02/08/2018   Procedure: REMOVAL OF [place] LEFT VENTRICULAR ASSIST DEVICE;  Surgeon: Rexene Alberts, MD;  Location: [place];  Service: Open Heart Surgery;  Laterality: N/A;   RIGHT HEART CATH N/A 02/03/2018   Procedure: RIGHT HEART CATH;  Surgeon: Martinique, Peter M, MD;  Location: [place] CV LAB;  Service: Cardiovascular;  Laterality: N/A;   RIGHT HEART CATH N/A 05/09/2018   Procedure: RIGHT HEART CATH;  Surgeon: Jolaine Artist, MD;  Location: [place] CV LAB;  Service: Cardiovascular;  Laterality: N/A;   TEE WITHOUT CARDIOVERSION N/A 02/08/2018   Procedure: TRANSESOPHAGEAL ECHOCARDIOGRAM (TEE);  Surgeon: Rexene Alberts, MD;  Location: [place];  Service: Open Heart Surgery;  Laterality: N/A;   TUBAL LIGATION     VIDEO BRONCHOSCOPY WITH ENDOBRONCHIAL ULTRASOUND N/A 03/11/2014   Procedure: VIDEO BRONCHOSCOPY WITH  ENDOBRONCHIAL ULTRASOUND;  Surgeon: Melrose Nakayama, MD;  Location: [facility] OR;  Service: Thoracic;  Laterality: N/A;    Current Outpatient Medications  Medication Sig Dispense Refill   acetaminophen (TYLENOL) 325 MG tablet Take 2 tablets (650 mg total) by mouth every 6 (six) hours as needed for mild pain, moderate pain or fever (or Fever >/= 101).     albuterol (VENTOLIN HFA) 108 (90 Base) MCG/ACT inhaler Inhale 2 puffs into the lungs every 6 (six) hours as needed for wheezing or shortness of breath. 1 each 0   apixaban (ELIQUIS) 5 MG TABS tablet Take 1 tablet (5 mg total) by mouth 2 (two) times daily. (Patient taking differently: Take 5 mg by mouth daily.) 60 tablet 6   atorvastatin (LIPITOR) 80 MG tablet Take 1 tablet (80 mg total) by mouth daily at 6 PM. 30 tablet 1   blood glucose meter kit and supplies KIT Dispense based on patient and insurance preference. Use up to four times daily as directed. 1 each 0   CORLANOR 7.5 MG TABS tablet TAKE 1 TABLET (7.5 MG TOTAL) BY MOUTH 2 (TWO) TIMES DAILY WITH A MEAL. (Patient taking differently: Take 7.5 mg by mouth 2 (two) times daily with a meal.) 180 tablet 1   FARXIGA 10 MG TABS tablet TAKE  1 TABLET (10 MG TOTAL) BY MOUTH DAILY BEFORE BREAKFAST. (Patient taking differently: 10 mg daily.) 30 tablet 6   Glucerna (GLUCERNA) LIQD Take 237 mLs by mouth See admin instructions. Takes 1 bottle twice a week     ipratropium-albuterol (DUONEB) 0.5-2.5 (3) MG/3ML SOLN Take 3 mLs by nebulization every 4 (four) hours as needed (for shortness of breath).     metolazone (ZAROXOLYN) 2.5 MG tablet Take 2.5 mg by mouth daily as needed (edema).     Multiple Vitamin (MULTIVITAMIN WITH MINERALS) TABS tablet Take 1 tablet by mouth daily.     naphazoline-pheniramine (VISINE) 0.025-0.3 % ophthalmic solution Place 1 drop into both eyes 4 (four) times daily as needed for eye irritation.     nitroGLYCERIN (NITROSTAT) 0.4 MG SL tablet Place 1 tablet (0.4 mg total) under the tongue  every 5 (five) minutes as needed for chest pain. 90 tablet 5   OVER THE COUNTER MEDICATION Place 1 spray into both nostrils daily as needed (for congestion).     spironolactone (ALDACTONE) 25 MG tablet Take 0.5 tablets (12.5 mg total) by mouth daily. 45 tablet 3   torsemide (DEMADEX) 20 MG tablet Take 2 tablets (40 mg total) by mouth daily. (Patient taking differently: Take 40 mg by mouth daily. Takes 20 mg extra if needed for fluid) 60 tablet 1   No current facility-administered medications for this visit.    Allergies:   Codeine   Social History: Social History   Socioeconomic History   Marital status: Married    Spouse name: Michell Heinrich    Number of children: 1   Years of education: Not on file   Highest education level: Not on file  Occupational History   Not on file  Tobacco Use   Smoking status: Former    Packs/day: 1.00    Years: 20.00    Pack years: 20.00    Types: Cigarettes    Quit date: 03/13/2014    Years since quitting: 8.1   Smokeless tobacco: Never  Vaping Use   Vaping Use: Never used  Substance and Sexual Activity   Alcohol use: Yes    Alcohol/week: 8.0 standard drinks    Types: 8 Glasses of wine per week   Drug use: Yes    Types: Marijuana    Comment: "medicinal"   Sexual activity: Never  Other Topics Concern   Not on file  Social History Narrative   Not on file   Social Determinants of Health   Financial Resource Strain: Not on file  Food Insecurity: Not on file  Transportation Needs: Unmet Transportation Needs   Lack of Transportation (Medical): Yes   Lack of Transportation (Non-Medical): No  Physical Activity: Not on file  Stress: Not on file  Social Connections: Not on file  Intimate Partner Violence: Not on file    Family History: Family History  Problem Relation Age of Onset   Heart disease Mother    Hypertension Mother    Heart attack Mother    Hypertension Maternal Grandmother    Cancer Maternal Grandmother    Diabetes  Paternal Grandmother    Stroke Neg Hx    Review of systems complete and found to be negative unless listed in HPI.     Physical Exam: There were no vitals filed for this visit.   General: Ple[place], NAD. No resp difficulty Psych: Normal affect. HEENT:  Normal, without mass or lesion.         Neck: Supple, no bruits or JVD. Carotids  2+. No lymphadenopathy/thyromegaly appreciated. Heart: PMI nondisplaced. RRR no s3, s4, or murmurs. Lungs:  Resp regular and unlabored, CTA. Abdomen: Soft, non-tender, non-distended, No HSM, BS + x 4.   Extremities: No clubbing, cyanosis or edema. DP/PT/Radials 2+ and equal bilaterally. Neuro: Alert and oriented X 3. Moves all extremities spontaneously.   ICD interrogation- Not checked today. Barostim visit Barostim interrogation/programming: Performed personally today. See scanned report  EKG:  EKG is not ordered today Personal review of EKG ordered 04/09/2022 shows NSR at 99 bpm  Recent Labs: 07/19/2021: NT-Pro BNP 3,162 04/09/2022: B Natriuretic Peptide 2,367.1 04/12/2022: ALT 24; Magnesium 2.1 04/15/2022: Hemoglobin 11.6; Platelets 168 04/16/2022: BUN 40; Creatinine, Ser 1.16; Potassium 4.3; Sodium 136   Wt Readings from Last 3 Encounters:  04/15/22 100 lb 1.6 oz (45.4 kg)  03/24/22 101 lb 6.4 oz (46 kg)  12/22/21 102 lb (46.3 kg)     Other studies Reviewed: Additional studies/ records that were reviewed today include: Previous EP and HF notes   Assessment and Plan:  1.  Chronic systolic dysfunction s/p Medtronic single chamber ICD  Now s/p BAT 04/13/2022 Device titrated from 1.0 ma to *** today without stim.  Stable on an appropriate medical regimen Normal ICD function See Pace Art report No changes today  2. CAD s/p CABG Denies anginal symptoms  3. Chronic hypoxic respiratory failure Wears O2 intermittently ***  4. PAF 0% burden by most recent remote.  Off [place] currently due to h/o hemoptysis CHA2DS2/VASc is at least 5.  Current  medicines are reviewed at length with the patient today.    Disposition:   Follow up with EP APP  ***   Signed, Shirley Friar, PA-C  04/24/2022 9:05 AM  [facility] [address] [place] [address] [phone] (office) [phone] (fax)

## 2022-04-25 ENCOUNTER — Encounter (HOSPITAL_COMMUNITY): Payer: Self-pay

## 2022-04-25 ENCOUNTER — Ambulatory Visit (INDEPENDENT_AMBULATORY_CARE_PROVIDER_SITE_OTHER): Payer: BC Managed Care – PPO

## 2022-04-25 ENCOUNTER — Ambulatory Visit (HOSPITAL_COMMUNITY)
Admit: 2022-04-25 | Discharge: 2022-04-25 | Disposition: A | Discharge status: Home / Self Care | Payer: BC Managed Care – PPO | Attending: Family Medicine | Admitting: Family Medicine

## 2022-04-25 ENCOUNTER — Encounter: Payer: Self-pay | Admitting: Cardiology

## 2022-04-25 VITALS — BP 106/58 | HR 72 | Wt 103.0 lb

## 2022-04-25 DIAGNOSIS — Z9889 Other specified postprocedural states: Secondary | ICD-10-CM | POA: Diagnosis not present

## 2022-04-25 DIAGNOSIS — J90 Pleural effusion, not elsewhere classified: Secondary | ICD-10-CM | POA: Diagnosis not present

## 2022-04-25 DIAGNOSIS — N280 Ischemia and infarction of kidney: Secondary | ICD-10-CM

## 2022-04-25 DIAGNOSIS — I5022 Chronic systolic (congestive) heart failure: Secondary | ICD-10-CM

## 2022-04-25 DIAGNOSIS — Z7901 Long term (current) use of anticoagulants: Secondary | ICD-10-CM | POA: Insufficient documentation

## 2022-04-25 DIAGNOSIS — Z923 Personal history of irradiation: Secondary | ICD-10-CM | POA: Insufficient documentation

## 2022-04-25 DIAGNOSIS — Z9221 Personal history of antineoplastic chemotherapy: Secondary | ICD-10-CM | POA: Insufficient documentation

## 2022-04-25 DIAGNOSIS — C3490 Malignant neoplasm of unspecified part of unspecified bronchus or lung: Secondary | ICD-10-CM | POA: Insufficient documentation

## 2022-04-25 DIAGNOSIS — J449 Chronic obstructive pulmonary disease, unspecified: Secondary | ICD-10-CM

## 2022-04-25 DIAGNOSIS — Z951 Presence of aortocoronary bypass graft: Secondary | ICD-10-CM | POA: Diagnosis not present

## 2022-04-25 DIAGNOSIS — Z95810 Presence of automatic (implantable) cardiac defibrillator: Secondary | ICD-10-CM

## 2022-04-25 DIAGNOSIS — L905 Scar conditions and fibrosis of skin: Secondary | ICD-10-CM | POA: Diagnosis not present

## 2022-04-25 DIAGNOSIS — R079 Chest pain, unspecified: Secondary | ICD-10-CM

## 2022-04-25 DIAGNOSIS — Z87891 Personal history of nicotine dependence: Secondary | ICD-10-CM | POA: Diagnosis not present

## 2022-04-25 DIAGNOSIS — I480 Paroxysmal atrial fibrillation: Secondary | ICD-10-CM | POA: Insufficient documentation

## 2022-04-25 DIAGNOSIS — J440 Chronic obstructive pulmonary disease with acute lower respiratory infection: Secondary | ICD-10-CM | POA: Insufficient documentation

## 2022-04-25 DIAGNOSIS — Z79899 Other long term (current) drug therapy: Secondary | ICD-10-CM | POA: Diagnosis not present

## 2022-04-25 DIAGNOSIS — I252 Old myocardial infarction: Secondary | ICD-10-CM | POA: Insufficient documentation

## 2022-04-25 DIAGNOSIS — R042 Hemoptysis: Secondary | ICD-10-CM | POA: Insufficient documentation

## 2022-04-25 DIAGNOSIS — I2510 Atherosclerotic heart disease of native coronary artery without angina pectoris: Secondary | ICD-10-CM | POA: Diagnosis not present

## 2022-04-25 DIAGNOSIS — E875 Hyperkalemia: Secondary | ICD-10-CM | POA: Insufficient documentation

## 2022-04-25 DIAGNOSIS — J9611 Chronic respiratory failure with hypoxia: Secondary | ICD-10-CM | POA: Insufficient documentation

## 2022-04-25 DIAGNOSIS — I110 Hypertensive heart disease with heart failure: Secondary | ICD-10-CM | POA: Insufficient documentation

## 2022-04-25 LAB — CBC
HCT: 39.5 % (ref 36.0–46.0)
Hemoglobin: 12.2 g/dL (ref 12.0–15.0)
MCH: 30 pg (ref 26.0–34.0)
MCHC: 30.9 g/dL (ref 30.0–36.0)
MCV: 97.1 fL (ref 80.0–100.0)
Platelets: 186 10*3/uL (ref 150–400)
RBC: 4.07 MIL/uL (ref 3.87–5.11)
RDW: 19.1 % — ABNORMAL HIGH (ref 11.5–15.5)
WBC: 8.4 10*3/uL (ref 4.0–10.5)
nRBC: 0 % (ref 0.0–0.2)

## 2022-04-25 LAB — BASIC METABOLIC PANEL
Anion gap: 8 (ref 5–15)
BUN: 17 mg/dL (ref 8–23)
CO2: 26 mmol/L (ref 22–32)
Calcium: 9.3 mg/dL (ref 8.9–10.3)
Chloride: 107 mmol/L (ref 98–111)
Creatinine, Ser: 1.18 mg/dL — ABNORMAL HIGH (ref 0.44–1.00)
GFR, Estimated: 53 mL/min — ABNORMAL LOW (ref >60)
Glucose, Bld: 116 mg/dL — ABNORMAL HIGH (ref 70–99)
Potassium: 3.9 mmol/L (ref 3.5–5.1)
Sodium: 141 mmol/L (ref 135–145)

## 2022-04-25 LAB — BRAIN NATRIURETIC PEPTIDE: B Natriuretic Peptide: 1232.9 pg/mL — ABNORMAL HIGH (ref 0.0–100.0)

## 2022-04-25 MED ORDER — TORSEMIDE 20 MG PO TABS
60.0000 mg | ORAL_TABLET | Freq: Every day | ORAL | 1 refills | Status: DC
Start: 2022-04-25 — End: 2022-05-17

## 2022-04-25 NOTE — Progress Notes (Addendum)
?  ? ?Advanced HF Clinic Note ? ? ?PCP:  Traci Sermon, PA-C  ?Cardiologist:  None ?Primary HF: Dr [name] ?Oncology: Dr [name]  ? ?Chief Complaint: follow up for heart failure ? ?HPI: ?Brittany [name] is a 61 y.o.Mar[place] Kitchen female with a history of PAF,  previous smoker quit 2015  years ago, hypertension, previous small cell lung cancer treated with chemo, chest XRT and prophylactic brain radiation in 2015, CAD s/p CABG and chronic systolic HF EF ~85% ? ?Admitted 02/03/2018 with NSTEMI and shock. Underwent emergent cath 02/03/18 showed LAD 100% stenosed, LCx 95% stenosed. Taken for emergent CABG 02/03/18. Required impella post op. Hospital course complicated by cardiogenic shock, HCAP, A fib, respiratory failure, and swallowing issues. She was discharged to SNF. Discharge weight 103 pounds.  ?  ?In 2019 had multiple hospitalizations for HF and pleural effusion. Underwent pleurodesis at [facility]. ?  ?Admitted 3/20 with NSTEMI and HF. Received DES to ostial ramus into distal left main based on cath below and underwent diuresis. Meds adjusted as tolerated. Echo with EF 25-30%.  ? ?Admitted 11/21 with COPD flare with mild HF and hemoptysis. CTA showed stable scarring in the right hilum consistent with emphysema.. Underwent bronchoscopy on 11/15, no obvious source of bleeding found on examination.  ? ?Echo 11/21 EF 20-25%. RV mildly HK.  ? ?Admitted 3/22 with chest pain and shortness of breath. HS-Trop with no trend. Echo with EF < 20%. Treated for AECOPD exacerbation. Breathing improved. Discharged on torsemide 60 mg daily. ? ?Admitted 4/22 with ADHF. Diuresed with IV lasix and transitioned back to torsemide 80 mg daily x 4 days then down to torsemide 60 mg daily.  ? ?Admitted 12/22 with R renal infarct felt to be cardio-embolic (no AF found). Started on Eliquis ? ?Was seen in ER 4/23 for worsening SOB. CXR ok. Given 1 dose of IV lasix and nebs.  ? ?Follow up 03/24/22, breathing improved, fluid OK on OptiVol and  exam, weight 101 lbs. ? ?Admitted 5/23 with a/c CHF and COPD. Diuresed with IV lasix. Treated with doxy, pred and nebs for COPD exacerbation. Underwent Barostim 04/14/22. Discharged home, weight 100 lbs. ? ?Today she returns for post hospital HF follow up. Overall feeling fair. Notified by Sharman Cheek today her fluid was up. More SOB with walking. Using rescue inhaler more frequently and has occasional dizziness. Eating a lot of fast food and take out since discharge: Whopper and fries from [employer], [place] egg [employer] at [place]. Denies palpitations, CP, edema, or PND/Orthopnea. No fever or chills. Weight at home 100-103 pounds. Taking all medications.  ? ?Cardidac Studies: ?ECHO 1/23: EF 20-25%, severely reduced LV with global HK, grade II DD, normal RV ?ECHO 02/10/21: EF <20%, G2DD ?ECHO 2022: EF < 20% RV normal  ?ECHO 2021: EF 20-25% RV mildly reduced.  ?ECHO 02/12/19: EF 25-30%, RV mildly reduced ? ?05/18/2021 PFT's: moderately severe airway obstruction and a diffusion defect suggesting emphysema. ?  ?LHC 02/12/19: ?Ost LAD to Prox LAD lesion is 100% stenosed. ?Ost Cx to Prox Cx lesion is 100% stenosed. ?Ost Ramus lesion is 99% stenosed. ?Post intervention, there is a 0% residual stenosis. ?A drug-eluting stent was successfully placed using a [place] 2.25X15. ?SVG and is normal in caliber. ?The graft exhibits no disease. ?Ost 1st Diag lesion is 70% stenosed. ?SVG and is normal in caliber. ?Prox Graft lesion is 100% stenosed. ?1.  Significant underlying two-vessel coronary artery disease with patent SVG to OM and SVG to diagonal which  supplies the LAD territory.  Atretic LIMA to LAD given competitive flow from SVG to diagonal.  Severe ostial ramus artery stenosis not supplied by bypass with his area suggestive of plaque rupture. ?2.  Severely elevated left ventricular end-diastolic pressure 34 mmHg.  Left ventricular angiography was not performed. ?3.  Successful angioplasty and drug-eluting stent  placement to the ostial ramus extending into the distal left main. ? ?[place] 2019  ?RA = 8 ?RV = 49/9 ?PA = 47/20 (32) ?PCW = 19 ?Fick cardiac output/index = 4.3/3.1 ?PVR = 3.0 wu ?AO sat = 93% ?PA sat = 62%, 64%  ?Assessment: ?1. Minimally elevated left heart pressures ?2. Mild PAH ?3. Normal cardiac output ? ?Past Medical History:  ?Diagnosis Date  ? Acute respiratory failure ([place]) 05/05/2018  ? Acute systolic congestive heart failure ([place]) 02/03/2018  ? AICD (automatic cardioverter/defibrillator) present   ? Allergy   ? Anxiety   ? Asthma   ? DM2 (diabetes mellitus, type 2) ([place]) 10/20/2020  ? Hypertension   ? PAF (paroxysmal atrial fibrillation) ([place])   ? Presence of permanent cardiac pacemaker   ? Prophylactic measure 08/03/14-08/19/14  ? Prophyl. cranial radiation 24 Gy  ? S/P emergency CABG x 3 02/03/2018  ? LIMA to LAD, SVG to D1, SVG to OM1, EVH via right thigh with implantation of Impella LD LVAD via direct aortic approach  ? Small cell lung cancer ([place]) 03/16/2014  ? ?Past Surgical History:  ?Procedure Laterality Date  ? BRONCHIAL BRUSHINGS  10/25/2020  ? Procedure: BRONCHIAL BRUSHINGS;  Surgeon: Collene Gobble, MD;  Location: [facility] ENDOSCOPY;  Service: Pulmonary;;  ? BRONCHIAL NEEDLE ASPIRATION BIOPSY  10/25/2020  ? Procedure: BRONCHIAL NEEDLE ASPIRATION BIOPSIES;  Surgeon: Collene Gobble, MD;  Location: [facility] ENDOSCOPY;  Service: Pulmonary;;  ? [place]  08/15/2018  ? MDT Visia AF MRI VR ICD implanted by Dr Loel Lofty for primary prevention of sudden  ? CESAREAN SECTION    ? CORONARY ARTERY BYPASS GRAFT N/A 02/03/2018  ? Procedure: CORONARY ARTERY BYPASS GRAFTING (CABG);  Surgeon: Rexene Alberts, MD;  Location: [place];  Service: Open Heart Surgery;  Laterality: N/A;  Time 3 using left internal mammary artery and endoscopically harvested right saphenous vein  ? CORONARY BALLOON ANGIOPLASTY N/A 02/03/2018  ? Procedure: CORONARY BALLOON ANGIOPLASTY;  Surgeon: Martinique, Peter M, MD;  Location: [place] CV LAB;  Service: Cardiovascular;  Laterality: N/A;  ? CORONARY STENT INTERVENTION N/A 02/12/2019  ? Procedure: CORONARY STENT INTERVENTION;  Surgeon: Wellington Hampshire, MD;  Location: [place] CV LAB;  Service: Cardiovascular;  Laterality: N/A;  ? CORONARY/GRAFT ACUTE MI REVASCULARIZATION N/A 02/03/2018  ? Procedure: Coronary/Graft Acute MI Revascularization;  Surgeon: Martinique, Peter M, MD;  Location: [place] CV LAB;  Service: Cardiovascular;  Laterality: N/A;  ? ENDOBRONCHIAL ULTRASOUND N/A 10/25/2020  ? Procedure: ENDOBRONCHIAL ULTRASOUND;  Surgeon: Collene Gobble, MD;  Location: [facility] ENDOSCOPY;  Service: Pulmonary;  Laterality: N/A;  ? FLEXIBLE BRONCHOSCOPY  10/25/2020  ? Procedure: FLEXIBLE BRONCHOSCOPY;  Surgeon: Collene Gobble, MD;  Location: [facility] ENDOSCOPY;  Service: Pulmonary;;  ? IABP INSERTION N/A 02/03/2018  ? Procedure: IABP Insertion;  Surgeon: Martinique, Peter M, MD;  Location: [place] CV LAB;  Service: Cardiovascular;  Laterality: N/A;  ? INTRAOPERATIVE TRANSESOPHAGEAL ECHOCARDIOGRAM N/A 02/03/2018  ? Procedure: INTRAOPERATIVE TRANSESOPHAGEAL ECHOCARDIOGRAM;  Surgeon: Rexene Alberts, MD;  Location: [place];  Service: Open Heart Surgery;  Laterality: N/A;  ? LEFT HEART CATH AND CORONARY ANGIOGRAPHY N/A 02/03/2018  ?  Procedure: LEFT HEART CATH AND CORONARY ANGIOGRAPHY;  Surgeon: Martinique, Peter M, MD;  Location: [place] CV LAB;  Service: Cardiovascular;  Laterality: N/A;  ? LEFT HEART CATH AND CORS/GRAFTS ANGIOGRAPHY N/A 02/12/2019  ? Procedure: LEFT HEART CATH AND CORS/GRAFTS ANGIOGRAPHY;  Surgeon: Wellington Hampshire, MD;  Location: [place] CV LAB;  Service: Cardiovascular;  Laterality: N/A;  ? MEDIASTINOSCOPY N/A 03/11/2014  ? Procedure: MEDIASTINOSCOPY;  Surgeon: Melrose Nakayama, MD;  Location: [place];  Service: Thoracic;  Laterality: N/A;  ? PLACEMENT OF IMPELLA LEFT VENTRICULAR ASSIST DEVICE  02/03/2018  ? Procedure: PLACEMENT OF [place] LEFT VENTRICULAR ASSIST DEVICE LD;  Surgeon:  Rexene Alberts, MD;  Location: [place];  Service: Open Heart Surgery;;  ? REMOVAL OF IMPELLA LEFT VENTRICULAR ASSIST DEVICE N/A 02/08/2018  ? Procedure: REMOVAL OF [place] LEFT VENTRICULAR ASSIST DEVICE;  Surgeon: Roxy Manns

## 2022-04-25 NOTE — Patient Instructions (Signed)
Thank you for coming in today ? ?Labs were done today, if any labs are abnormal the clinic will call you ?No news is good news ? ?INCREASE Torsemide to 60 mg daily  ? ?Your physician recommends that you schedule a follow-up appointment in:  ?2-3 weeks in clinic ? ?At the [facility], you and your health needs are our priority. As part of our continuing mission to provide you with exceptional heart care, we have created designated Provider Care Teams. These Care Teams include your primary Cardiologist (physician) and Advanced Practice Providers (APPs- Physician Assistants and Nurse Practitioners) who all work together to provide you with the care you need, when you need it.  ? ?You may see any of the following providers on your designated Care Team at your next follow up: ?Dr Glori Bickers ?Dr Loralie Champagne ?Darrick Grinder, NP ?Lyda Jester, PA ?Jessica [name],NP ?Marlyce Huge, PA ?Audry Riles, PharmD ? ? ?Please be sure to bring in all your medications bottles to every appointment.  ? ?If you have any questions or concerns before your next appointment please send [place] a message through [place] or call our office at [phone].   ? ?TO LEAVE A MESSAGE FOR THE NURSE SELECT OPTION 2, PLEASE LEAVE A MESSAGE INCLUDING: ?YOUR NAME ?DATE OF BIRTH ?CALL BACK NUMBER ?REASON FOR CALL**this is important as we prioritize the call backs ? ?YOU WILL RECEIVE A CALL BACK THE SAME DAY AS LONG AS YOU CALL BEFORE 4:00 PM ? ?

## 2022-04-25 NOTE — Progress Notes (Signed)
EPIC Encounter for ICM Monitoring ? ?Patient Name: Brittany [name] is a 61 y.o. female ?Date: 04/25/2022 ?Primary Care Physican: Traci Sermon, PA-C ?Primary Cardiologist: [place] ?Electrophysiologist: [place] ?03/24/2022 Weight: 101 lbs ?04/19/2022 Weight: 101 lbs ?  ?Spoke with patient and heart failure questions reviewed.  Pt reports breathing has become more labored in the last couple of days. Sometimes difficult to tell if it is her lungs or fluid accumulation. She has appt with HF clinic today ?  ?Optivol Thoracic impedance suggesting possible fluid accumulation returned 5/12.  Barostim implant. ?  ?Prescribed: ?Torsemide 20 mg Take 40 mg by mouth daily. Takes 20 mg extra if needed for fluid ?Metolazone 2.5 mg take by mouth as needed for fluid  ?Spironolactone 25 mg take 0.5 tablet by mouth daily ?Farxiga 10 mg take 1 tablet daily ?  ?Labs: ?04/16/2022 Creatinine 1.16, BUN 40, Potassium 4.3, Sodium 136, GFR 54 ?04/15/2022 Creatinine 1.39, BUN 43, Potassium 3.8, Sodium 136, GFR 43  ?04/14/2022 Creatinine 1.27, BUN 40, Potassium 4.3, Sodium 136, GFR 48  ?04/13/2022 Creatinine 1.09, BUN 46, Potassium 4.2, Sodium 136, GFR 58  ?04/12/2022 Creatinine 1.38, BUN 41, Potassium 3.9, Sodium 137, GFR 44  ?04/11/2022 Creatinine 1.29, BUN 38, Potassium 4.1, Sodium 138, GFR 47  ?04/10/2022 Creatinine 1.17, BUN 29, Potassium 4.3, Sodium 138, GFR 53  ?04/09/2022 Creatinine 1.20, BUN 21, Potassium 3.4, Sodium 138, GFR 52  ?A complete set of results can be found in Results Review. ?  ?Recommendations:  She will receive recommendations at HF clinic OV today.   ?  ?Follow-up plan: ICM clinic phone appointment on 05/02/2022 to recheck fluid levels.   91 day device clinic remote transmission 06/21/2022.   ?  ?EP/Cardiology Office Visits:  05/01/2022 with Oda Kilts, PA for post barostim implant.  07/24/2022 with Dr [name]. ?  ?Copy of ICM check sent to Dr. Rayann Heman.  ?  ?3 month ICM trend: 04/25/2022. ? ? ? ?12-14 Month  ICM trend:  ? ? ? ?[name], RN ?04/25/2022 ?10:29 AM ? ?

## 2022-04-26 ENCOUNTER — Encounter (HOSPITAL_COMMUNITY): Payer: Self-pay

## 2022-04-26 NOTE — Addendum Note (Signed)
Encounter addended by: Rafael Bihari, FNP on: 04/26/2022 7:50 AM ? Actions taken: Clinical Note Signed

## 2022-05-01 ENCOUNTER — Encounter: Payer: BC Managed Care – PPO | Admitting: Student

## 2022-05-01 DIAGNOSIS — Z95810 Presence of automatic (implantable) cardiac defibrillator: Secondary | ICD-10-CM

## 2022-05-01 DIAGNOSIS — I5022 Chronic systolic (congestive) heart failure: Secondary | ICD-10-CM

## 2022-05-02 ENCOUNTER — Ambulatory Visit (INDEPENDENT_AMBULATORY_CARE_PROVIDER_SITE_OTHER): Payer: BC Managed Care – PPO

## 2022-05-02 DIAGNOSIS — I5022 Chronic systolic (congestive) heart failure: Secondary | ICD-10-CM

## 2022-05-02 DIAGNOSIS — Z95810 Presence of automatic (implantable) cardiac defibrillator: Secondary | ICD-10-CM

## 2022-05-02 NOTE — Progress Notes (Unsigned)
EPIC Encounter for ICM Monitoring  Patient Name: Brittany [name] is a 61 y.o. female Date: 05/02/2022 Primary Care Physican: Traci Sermon, PA-C Primary Cardiologist: [place] Electrophysiologist: Allred 03/24/2022 Weight: 101 lbs 04/19/2022 Weight: 101 lbs   Spoke with patient and heart failure questions reviewed.  Pt reports breathing improved and she is feeling okay today.  Blood sugar is a little high today, 226.     Optivol Thoracic impedance suggesting fluid level trending back close to baseline.  Barostim implant.   Prescribed: Torsemide 20 mg Take 60 mg by mouth daily.  Metolazone 2.5 mg take by mouth as needed for fluid  Spironolactone 25 mg take 0.5 tablet by mouth daily Farxiga 10 mg take 1 tablet daily   Labs: 04/25/2022 Creatinine 1.18, BUN 17, Potassium 3.9, Sodium 141, GFR 53 04/16/2022 Creatinine 1.16, BUN 40, Potassium 4.3, Sodium 136, GFR 54 04/15/2022 Creatinine 1.39, BUN 43, Potassium 3.8, Sodium 136, GFR 43  04/14/2022 Creatinine 1.27, BUN 40, Potassium 4.3, Sodium 136, GFR 48  04/13/2022 Creatinine 1.09, BUN 46, Potassium 4.2, Sodium 136, GFR 58  04/12/2022 Creatinine 1.38, BUN 41, Potassium 3.9, Sodium 137, GFR 44  04/11/2022 Creatinine 1.29, BUN 38, Potassium 4.1, Sodium 138, GFR 47  04/10/2022 Creatinine 1.17, BUN 29, Potassium 4.3, Sodium 138, GFR 53  04/09/2022 Creatinine 1.20, BUN 21, Potassium 3.4, Sodium 138, GFR 52  A complete set of results can be found in Results Review.   Recommendations:   Discussed limiting salt intake, avoiding restaurant foods if possible and call if fluid symptoms return.  She is taking Torsemide 60 mg as prescribed.   Sent to [facility], NP for follow up after last weeks OV.    Follow-up plan: ICM clinic phone appointment on 05/09/2022 to recheck fluid levels.   91 day device clinic remote transmission 06/21/2022.     EP/Cardiology Office Visits:  05/03/2022 with Oda Kilts, PA for post barostim implant.   07/24/2022 with Dr [name].   Copy of ICM check sent to Dr. Rayann Heman.  3 month ICM trend: 05/02/2022.    12-14 Month ICM trend:     [name], RN 05/02/2022 11:54 AM

## 2022-05-03 ENCOUNTER — Ambulatory Visit (INDEPENDENT_AMBULATORY_CARE_PROVIDER_SITE_OTHER): Payer: BC Managed Care – PPO | Admitting: Student

## 2022-05-03 VITALS — BP 110/58 | HR 83 | Ht 64.0 in | Wt 101.2 lb

## 2022-05-03 DIAGNOSIS — I480 Paroxysmal atrial fibrillation: Secondary | ICD-10-CM | POA: Diagnosis not present

## 2022-05-03 DIAGNOSIS — Z95810 Presence of automatic (implantable) cardiac defibrillator: Secondary | ICD-10-CM | POA: Diagnosis not present

## 2022-05-03 DIAGNOSIS — I2510 Atherosclerotic heart disease of native coronary artery without angina pectoris: Secondary | ICD-10-CM

## 2022-05-03 DIAGNOSIS — I5022 Chronic systolic (congestive) heart failure: Secondary | ICD-10-CM

## 2022-05-03 NOTE — Progress Notes (Signed)
   Electrophysiology Office Note Date: 05/03/2022  ID:  Brittany [name], DOB 09/24/1961, MRN [id number]  PCP: Costella, Vincent J, PA-C Primary Cardiologist: None Electrophysiologist: James Allred, MD   CC: Barostim titration   Brittany [name] is a 60 y.o. female seen today for barostim titration.  This was done as an inpatient during an admission for CHF exacerbation.   She is doing VERY well since implantation. She states she walked to her appointment from the HF clinic and only had to stop a couple of times to rest. Previously, it would take her up to an hour to make her king size bed completely. This is her initial titration visit.   The patients goals for barostim titration are Yoga, exercise and ADLs without difficulty, fatigue, or SOB.    Device History: Barostim (standard) implanted 04/13/2022 for Chronic systolic CHF Medtronic Single Chamber ICD implanted 2019 for Chronic systolic CHF.    Past Medical History:  Diagnosis Date   Acute respiratory failure (HCC) 05/05/2018   Acute systolic congestive heart failure (HCC) 02/03/2018   AICD (automatic cardioverter/defibrillator) present    Allergy    Anxiety    Asthma    DM2 (diabetes mellitus, type 2) (HCC) 10/20/2020   Hypertension    PAF (paroxysmal atrial fibrillation) (HCC)    Presence of permanent cardiac pacemaker    Prophylactic measure 08/03/14-08/19/14   Prophyl. 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   LIMA to LAD, SVG to D1, SVG to OM1, EVH via right thigh with implantation of Impella LD LVAD via direct aortic approach   Small cell lung cancer (HCC) 03/16/2014   Past Surgical History:  Procedure Laterality Date   BRONCHIAL BRUSHINGS  10/25/2020   Procedure: BRONCHIAL BRUSHINGS;  Surgeon: Byrum, Robert S, MD;  Location: [facility];  Service: Pulmonary;;   BRONCHIAL NEEDLE ASPIRATION BIOPSY  10/25/2020   Procedure: BRONCHIAL NEEDLE ASPIRATION BIOPSIES;  Surgeon: Byrum,  Robert S, MD;  Location: [facility];  Service: Pulmonary;;   CARDIAC DEFIBRILLATOR PLACEMENT  08/15/2018   MDT Visia AF MRI VR ICD implanted by Dr Sackett for primary prevention of sudden   CESAREAN SECTION     CORONARY ARTERY BYPASS GRAFT N/A 02/03/2018   Procedure: CORONARY ARTERY BYPASS GRAFTING (CABG);  Surgeon: Owen, Clarence H, MD;  Location: [facility];  Service: Open Heart Surgery;  Laterality: N/A;  Time 3 using left internal mammary artery and endoscopically harvested right saphenous vein   CORONARY BALLOON ANGIOPLASTY N/A 02/03/2018   Procedure: CORONARY BALLOON ANGIOPLASTY;  Surgeon: Jordan, Peter M, MD;  [facility];  Service: Cardiovascular;  Laterality: N/A;   CORONARY STENT INTERVENTION N/A 02/12/2019   Procedure: CORONARY STENT INTERVENTION;  Surgeon: Arida, Muhammad A, MD;  [facility];  Service: Cardiovascular;  Laterality: N/A;   CORONARY/GRAFT ACUTE MI REVASCULARIZATION N/A 02/03/2018   Procedure: Coronary/Graft Acute MI Revascularization;  Surgeon: Jordan, Peter M, MD;  [facility];  Service: Cardiovascular;  Laterality: N/A;   ENDOBRONCHIAL ULTRASOUND N/A 10/25/2020   Procedure: ENDOBRONCHIAL ULTRASOUND;  Surgeon: Byrum, Robert S, MD;  [facility];  Service: Pulmonary;  Laterality: N/A;   FLEXIBLE BRONCHOSCOPY  10/25/2020   Procedure: FLEXIBLE BRONCHOSCOPY;  Surgeon: Byrum, Robert S, MD;  [facility];  Service: Pulmonary;;   IABP INSERTION N/A 02/03/2018   Procedure: IABP Insertion;  Surgeon: Jordan, Peter M, MD;  [facility];  Service: Cardiovascular;  Laterality: N/A;   INTRAOPERATIVE   TRANSESOPHAGEAL ECHOCARDIOGRAM N/A 02/03/2018   Procedure: INTRAOPERATIVE TRANSESOPHAGEAL ECHOCARDIOGRAM;  Surgeon: Owen, Clarence H, MD;  [facility];  Service: Open Heart Surgery;  Laterality: N/A;   LEFT HEART CATH AND CORONARY ANGIOGRAPHY N/A 02/03/2018   Procedure: LEFT HEART CATH AND CORONARY  ANGIOGRAPHY;  Surgeon: Jordan, Peter M, MD;  [facility];  Service: Cardiovascular;  Laterality: N/A;   LEFT HEART CATH AND CORS/GRAFTS ANGIOGRAPHY N/A 02/12/2019   Procedure: LEFT HEART CATH AND CORS/GRAFTS ANGIOGRAPHY;  Surgeon: Arida, Muhammad A, MD;  [facility];  Service: Cardiovascular;  Laterality: N/A;   MEDIASTINOSCOPY N/A 03/11/2014   Procedure: MEDIASTINOSCOPY;  Surgeon: Steven C Hendrickson, MD;  [facility];  Service: Thoracic;  Laterality: N/A;   PLACEMENT OF IMPELLA LEFT VENTRICULAR ASSIST DEVICE  02/03/2018   Procedure: PLACEMENT OF IMPELLA LEFT VENTRICULAR ASSIST DEVICE LD;  Surgeon: Owen, Clarence H, MD;  [facility];  Service: Open Heart Surgery;;   REMOVAL OF IMPELLA LEFT VENTRICULAR ASSIST DEVICE N/A 02/08/2018   Procedure: REMOVAL OF IMPELLA LEFT VENTRICULAR ASSIST DEVICE;  Surgeon: Owen, Clarence H, MD;  [facility];  Service: Open Heart Surgery;  Laterality: N/A;   RIGHT HEART CATH N/A 02/03/2018   Procedure: RIGHT HEART CATH;  Surgeon: Jordan, Peter M, MD;  [facility];  Service: Cardiovascular;  Laterality: N/A;   RIGHT HEART CATH N/A 05/09/2018   Procedure: RIGHT HEART CATH;  Surgeon: Bensimhon, Daniel R, MD;  [facility];  Service: Cardiovascular;  Laterality: N/A;   TEE WITHOUT CARDIOVERSION N/A 02/08/2018   Procedure: TRANSESOPHAGEAL ECHOCARDIOGRAM (TEE);  Surgeon: Owen, Clarence H, MD;  [facility];  Service: Open Heart Surgery;  Laterality: N/A;   TUBAL LIGATION     VIDEO BRONCHOSCOPY WITH ENDOBRONCHIAL ULTRASOUND N/A 03/11/2014   Procedure: VIDEO BRONCHOSCOPY WITH ENDOBRONCHIAL ULTRASOUND;  Surgeon: Steven C Hendrickson, MD;  Location: [facility];  Service: Thoracic;  Laterality: N/A;    Current Outpatient Medications  Medication Sig Dispense Refill   albuterol (VENTOLIN HFA) 108 (90 Base) MCG/ACT inhaler Inhale 2 puffs into the lungs every 6 (six) hours as needed for wheezing or shortness of  breath. 1 each 0   apixaban (ELIQUIS) 5 MG TABS tablet Take 1 tablet (5 mg total) by mouth 2 (two) times daily. 60 tablet 6   atorvastatin (LIPITOR) 80 MG tablet Take 1 tablet (80 mg total) by mouth daily at 6 PM. 30 tablet 1   blood glucose meter kit and supplies KIT Dispense based on patient and insurance preference. Use up to four times daily as directed. 1 each 0   CORLANOR 7.5 MG TABS tablet TAKE 1 TABLET (7.5 MG TOTAL) BY MOUTH 2 (TWO) TIMES DAILY WITH A MEAL. 180 tablet 1   FARXIGA 10 MG TABS tablet TAKE 1 TABLET (10 MG TOTAL) BY MOUTH DAILY BEFORE BREAKFAST. 30 tablet 6   Glucerna (GLUCERNA) LIQD Take 237 mLs by mouth See admin instructions. Takes 1 bottle twice a week     ipratropium-albuterol (DUONEB) 0.5-2.5 (3) MG/3ML SOLN Take 3 mLs by nebulization every 4 (four) hours as needed (for shortness of breath).     metolazone (ZAROXOLYN) 2.5 MG tablet Take 2.5 mg by mouth daily as needed (edema).     Multiple Vitamin (MULTIVITAMIN WITH MINERALS) TABS tablet Take 1 tablet by mouth daily.     naphazoline-pheniramine (VISINE) 0.025-0.3 % ophthalmic solution Place 1 drop into both eyes 4 (four) times daily as needed for eye   irritation.     nitroGLYCERIN (NITROSTAT) 0.4 MG SL tablet Place 1 tablet (0.4 mg total) under the tongue every 5 (five) minutes as needed for chest pain. 90 tablet 5   OVER THE COUNTER MEDICATION Place 1 spray into both nostrils daily as needed (for congestion).     spironolactone (ALDACTONE) 25 MG tablet Take 0.5 tablets (12.5 mg total) by mouth daily. 45 tablet 3   torsemide (DEMADEX) 20 MG tablet Take 3 tablets (60 mg total) by mouth daily. 60 tablet 1   No current facility-administered medications for this visit.    Allergies:   Codeine   Social History: Social History   Socioeconomic History   Marital status: Married    Spouse name: Michell Heinrich    Number of children: 1   Years of education: Not on file   Highest education level: Not on file   Occupational History   Not on file  Tobacco Use   Smoking status: Former    Packs/day: 1.00    Years: 20.00    Pack years: 20.00    Types: Cigarettes    Quit date: 03/13/2014    Years since quitting: 8.1   Smokeless tobacco: Never  Vaping Use   Vaping Use: Never used  Substance and Sexual Activity   Alcohol use: Yes    Alcohol/week: 8.0 standard drinks    Types: 8 Glasses of wine per week   Drug use: Yes    Types: Marijuana    Comment: "medicinal"   Sexual activity: Never  Other Topics Concern   Not on file  Social History Narrative   Not on file   Social Determinants of Health   Financial Resource Strain: Not on file  Food Insecurity: Not on file  Transportation Needs: Unmet Transportation Needs   Lack of Transportation (Medical): Yes   Lack of Transportation (Non-Medical): No  Physical Activity: Not on file  Stress: Not on file  Social Connections: Not on file  Intimate Partner Violence: Not on file    Family History: Family History  Problem Relation Age of Onset   Heart disease Mother    Hypertension Mother    Heart attack Mother    Hypertension Maternal Grandmother    Cancer Maternal Grandmother    Diabetes Paternal Grandmother    Stroke Neg Hx      Review of Systems: All other systems reviewed and are otherwise negative except as noted above.  Physical Exam: Vitals:   05/03/22 1104  BP: (!) 110/58  Pulse: 83  SpO2: 98%  Weight: 101 lb 3.2 oz (45.9 kg)  Height: 5' 4" (1.626 m)     GEN- The patient is well appearing, alert and oriented x 3 today.   HEENT: normocephalic, atraumatic; sclera clear, conjunctiva pink; hearing intact; oropharynx clear; neck supple  Lungs- Clear to ausculation bilaterally, normal work of breathing.  No wheezes, rales, rhonchi Heart- Regular rate and rhythm, no murmurs, rubs or gallops  GI- soft, non-tender, non-distended, bowel sounds present  Extremities- no clubbing, cyanosis, or edema  MS- no significant deformity  or atrophy Skin- warm and dry, no rash or lesion; PPM pocket well healed Psych- euthymic mood, full affect Neuro- strength and sensation are intact  Barostim Interrogation- Performed personally and reviewed in detail today,  See scanned report  EKG:  EKG is not ordered today.  Recent Labs: 07/19/2021: NT-Pro BNP 3,162 04/12/2022: ALT 24; Magnesium 2.1 04/25/2022: B Natriuretic Peptide 1,232.9; BUN 17; Creatinine, Ser 1.18; Hemoglobin 12.2; Platelets 186; Potassium  3.9; Sodium 141   Wt Readings from Last 3 Encounters:  05/03/22 101 lb 3.2 oz (45.9 kg)  04/25/22 103 lb (46.7 kg)  04/15/22 100 lb 1.6 oz (45.4 kg)     Other studies Reviewed: Additional studies/ records that were reviewed today include: Previous EP and HF notes  Assessment and Plan:  1. Chronic systolic CHF s/p Medtronic and Barostim implantation NYHA II-III symptoms.   Device titrated from 1.0 millamp to 8.2 milliamp by increments of 0.4 with good blood pressure response and no stimulation.  Device impedence stable. Pt goals are to do yoga, exercise, and do her ADLs without difficulty, fatigue, or SOB. She is already making progress toward this  Normal device function See scanned report. Will follow up in 2 weeks for follow up. She made it all the way to 8.2 ma without stim today, so will potentially leave this as final programming pending pt response.   2. CAD s/p CABG Denies s/s ischemia  3. COPD Wears O2 intermittently.   4. PAF Low burden chronically Not on OAC due to recurrent hemoptysis  Current medicines are reviewed at length with the patient today.   The patient does not have concerns regarding her medicines.  The following changes were made today:  none   Disposition:   Follow up with EP APP in 2 Weeks for further barostim adjustment if needed. Will also transition her EP care to Dr. Lambert.    Signed, Michael Andrew Tillery, PA-C  05/03/2022 12:58 PM  [facility] [address] [place] [address] [phone] (office) [phone] (fax)  

## 2022-05-03 NOTE — Patient Instructions (Signed)
Medication Instructions:  Your physician recommends that you continue on your current medications as directed. Please refer to the Current Medication list given to you today.  *If you need a refill on your cardiac medications before your next appointment, please call your pharmacy*   Lab Work: None If you have labs (blood work) drawn today and your tests are completely normal, you will receive your results only by: [place] (if you have MyChart) OR A paper copy in the mail If you have any lab test that is abnormal or we need to change your treatment, we will call you to review the results.   Follow-Up: At [facility], you and your health needs are our priority.  As part of our continuing mission to provide you with exceptional heart care, we have created designated Provider Care Teams.  These Care Teams include your primary Cardiologist (physician) and Advanced Practice Providers (APPs -  Physician Assistants and Nurse Practitioners) who all work together to provide you with the care you need, when you need it.   Your next appointment:   05/17/2022

## 2022-05-04 NOTE — Progress Notes (Signed)
Spoke with patient and advised Brittany [name] does not want to make any changes. She saw Brittany [name] yesterday regarding Barostim adjustment.  She said she is doing really well with Barostim.

## 2022-05-04 NOTE — Progress Notes (Signed)
[name], Maricela Bo, FNP  Shriyan Arakawa Panda, RN No changes for now. Thank you

## 2022-05-09 ENCOUNTER — Ambulatory Visit (INDEPENDENT_AMBULATORY_CARE_PROVIDER_SITE_OTHER): Payer: BC Managed Care – PPO

## 2022-05-09 DIAGNOSIS — Z95810 Presence of automatic (implantable) cardiac defibrillator: Secondary | ICD-10-CM

## 2022-05-09 DIAGNOSIS — I5022 Chronic systolic (congestive) heart failure: Secondary | ICD-10-CM

## 2022-05-09 NOTE — Progress Notes (Signed)
EPIC Encounter for ICM Monitoring  Patient Name: Brittany [name] is a 61 y.o. female Date: 05/09/2022 Primary Care Physican: Traci Sermon, PA-C Primary Cardiologist: [place] Electrophysiologist: Allred 03/24/2022 Weight: 101 lbs 04/19/2022 Weight: 101 lbs   Spoke with patient and heart failure questions reviewed.  Pt reports she can tell her breathing is slightly labored which is usually indication she has fluid.  She thinks she has been drinking more than >64 oz fluid daily and she has been taking Torsemide 40 mg daily since last week with exception of today and yesterday.       Optivol Thoracic impedance suggesting possible fluid accumulation continues since 5/9.  Barostim implant.   Prescribed: Torsemide 20 mg Take 60 mg by mouth daily.  Metolazone 2.5 mg take by mouth as needed for fluid  Spironolactone 25 mg take 0.5 tablet by mouth daily Farxiga 10 mg take 1 tablet daily   Labs: 04/25/2022 Creatinine 1.18, BUN 17, Potassium 3.9, Sodium 141, GFR 53 04/16/2022 Creatinine 1.16, BUN 40, Potassium 4.3, Sodium 136, GFR 54 04/15/2022 Creatinine 1.39, BUN 43, Potassium 3.8, Sodium 136, GFR 43  04/14/2022 Creatinine 1.27, BUN 40, Potassium 4.3, Sodium 136, GFR 48  04/13/2022 Creatinine 1.09, BUN 46, Potassium 4.2, Sodium 136, GFR 58  04/12/2022 Creatinine 1.38, BUN 41, Potassium 3.9, Sodium 137, GFR 44  04/11/2022 Creatinine 1.29, BUN 38, Potassium 4.1, Sodium 138, GFR 47  04/10/2022 Creatinine 1.17, BUN 29, Potassium 4.3, Sodium 138, GFR 53  04/09/2022 Creatinine 1.20, BUN 21, Potassium 3.4, Sodium 138, GFR 52  A complete set of results can be found in Results Review.   Recommendations:  Advised not to decrease Torsemide dosage and take 60 mg as prescribed daily.  Also advised to limit fluid intake to 64 oz or less daily and limit salt intake.  Copy sent to Dr [name] for review and recommendations if needed.    Follow-up plan: ICM clinic phone appointment on 05/15/2022  to recheck fluid levels (manual).   91 day device clinic remote transmission 06/21/2022.     EP/Cardiology Office Visits:    07/24/2022 with Dr [name].   Copy of ICM check sent to Dr. Rayann Heman.  3 month ICM trend: 05/09/2022.    12-14 Month ICM trend:     [name], RN 05/09/2022 4:21 PM

## 2022-05-09 NOTE — Progress Notes (Signed)
Spoke with patient and advised Dr [name] recommended to take metolazone 2.5 mg 1 tablet and kcl 40 mEq.  She has a prescription of potassium 20 mEq tablets which has been stopped as a long term medication and confirmed dosages are 20 mEq tablets.  Pt has Metolazone and Potassium on hand and understands to take Metolazone 2.5 mg with Potassium 2 tablets to = 40 mEq as one time dosages only.  She repeated instructions back correctly.  Advised to call if she has any changes in condition.

## 2022-05-09 NOTE — Progress Notes (Signed)
[name], Brittany Pascal, MD  Brittany Meissner Panda, RN Take metolazone 2.5 and kcl 40 x 1   thanks

## 2022-05-10 NOTE — Progress Notes (Deleted)
Electrophysiology Office Note Date: 05/10/2022  ID:  Brittany [name], DOB 10-12-1961, MRN [id number]  PCP: Traci Sermon, PA-C Primary Cardiologist: None Electrophysiologist: Thompson Grayer, MD   CC: Barostim titration   Brittany [name] is a 61 y.o. female seen today for barostim titration.  This was done as an inpatient during an admission for CHF exacerbation.   At last visit, titrated from 1.0 ma to 8.2 ma.   Since last visit ***  She is doing VERY well since implantation. She states she walked to her appointment from the HF clinic and only had to stop a couple of times to rest. Previously, it would take her up to an hour to make her king size bed completely. This is her initial titration visit.   The patients goals for barostim titration are Yoga, exercise and ADLs without difficulty, fatigue, or SOB.    Device History: Barostim (standard) implanted 04/13/2022 for Chronic systolic CHF Medtronic Single Chamber ICD implanted 2019 for Chronic systolic CHF.    Past Medical History:  Diagnosis Date   Acute respiratory failure ([place]) 25/00/3704   Acute systolic congestive heart failure ([place]) 02/03/2018   AICD (automatic cardioverter/defibrillator) present    Allergy    Anxiety    Asthma    DM2 (diabetes mellitus, type 2) ([place]) 10/20/2020   Hypertension    PAF (paroxysmal atrial fibrillation) ([place])    Presence of permanent cardiac pacemaker    Prophylactic measure 08/03/14-08/19/14   Prophyl. 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   LIMA to LAD, SVG to D1, SVG to OM1, EVH via right thigh with implantation of Impella LD LVAD via direct aortic approach   Small cell lung cancer ([place]) 03/16/2014   Past Surgical History:  Procedure Laterality Date   BRONCHIAL BRUSHINGS  10/25/2020   Procedure: BRONCHIAL BRUSHINGS;  Surgeon: Collene Gobble, MD;  Location: [facility] ENDOSCOPY;  Service: Pulmonary;;   BRONCHIAL NEEDLE ASPIRATION BIOPSY   10/25/2020   Procedure: BRONCHIAL NEEDLE ASPIRATION BIOPSIES;  Surgeon: Collene Gobble, MD;  Location: [place] ENDOSCOPY;  Service: Pulmonary;;   CARDIAC DEFIBRILLATOR PLACEMENT  08/15/2018   MDT Visia AF MRI VR ICD implanted by Dr Loel Lofty for primary prevention of sudden   CESAREAN SECTION     CORONARY ARTERY BYPASS GRAFT N/A 02/03/2018   Procedure: CORONARY ARTERY BYPASS GRAFTING (CABG);  Surgeon: Rexene Alberts, MD;  Location: [place];  Service: Open Heart Surgery;  Laterality: N/A;  Time 3 using left internal mammary artery and endoscopically harvested right saphenous vein   CORONARY BALLOON ANGIOPLASTY N/A 02/03/2018   Procedure: CORONARY BALLOON ANGIOPLASTY;  Surgeon: Martinique, Peter M, MD;  Location: [place] CV LAB;  Service: Cardiovascular;  Laterality: N/A;   CORONARY STENT INTERVENTION N/A 02/12/2019   Procedure: CORONARY STENT INTERVENTION;  Surgeon: Wellington Hampshire, MD;  Location: [place] CV LAB;  Service: Cardiovascular;  Laterality: N/A;   CORONARY/GRAFT ACUTE MI REVASCULARIZATION N/A 02/03/2018   Procedure: Coronary/Graft Acute MI Revascularization;  Surgeon: Martinique, Peter M, MD;  Location: [place] CV LAB;  Service: Cardiovascular;  Laterality: N/A;   ENDOBRONCHIAL ULTRASOUND N/A 10/25/2020   Procedure: ENDOBRONCHIAL ULTRASOUND;  Surgeon: Collene Gobble, MD;  Location[facility] ENDOSCOPY;  Service: Pulmonary;  Laterality: N/A;   FLEXIBLE BRONCHOSCOPY  10/25/2020   Procedure: FLEXIBLE BRONCHOSCOPY;  Surgeon: Collene Gobble, MD;  Location: [facility] ENDOSCOPY;  Service: Pulmonary;;   IABP INSERTION N/A 02/03/2018   Procedure: IABP Insertion;  Surgeon: Martinique, Peter  M, MD;  Location: [place] CV LAB;  Service: Cardiovascular;  Laterality: N/A;   INTRAOPERATIVE TRANSESOPHAGEAL ECHOCARDIOGRAM N/A 02/03/2018   Procedure: INTRAOPERATIVE TRANSESOPHAGEAL ECHOCARDIOGRAM;  Surgeon: Rexene Alberts, MD;  Location: [place];  Service: Open Heart Surgery;  Laterality: N/A;   LEFT HEART CATH AND CORONARY  ANGIOGRAPHY N/A 02/03/2018   Procedure: LEFT HEART CATH AND CORONARY ANGIOGRAPHY;  Surgeon: Martinique, Peter M, MD;  Location: [place] CV LAB;  Service: Cardiovascular;  Laterality: N/A;   LEFT HEART CATH AND CORS/GRAFTS ANGIOGRAPHY N/A 02/12/2019   Procedure: LEFT HEART CATH AND CORS/GRAFTS ANGIOGRAPHY;  Surgeon: Wellington Hampshire, MD;  Location: [place] CV LAB;  Service: Cardiovascular;  Laterality: N/A;   MEDIASTINOSCOPY N/A 03/11/2014   Procedure: MEDIASTINOSCOPY;  Surgeon: Melrose Nakayama, MD;  Location: [place];  Service: Thoracic;  Laterality: N/A;   PLACEMENT OF IMPELLA LEFT VENTRICULAR ASSIST DEVICE  02/03/2018   Procedure: PLACEMENT OF [place] LEFT VENTRICULAR ASSIST DEVICE LD;  Surgeon: Rexene Alberts, MD;  Location: [place];  Service: Open Heart Surgery;;   REMOVAL OF [place] LEFT VENTRICULAR ASSIST DEVICE N/A 02/08/2018   Procedure: REMOVAL OF [place] LEFT VENTRICULAR ASSIST DEVICE;  Surgeon: Rexene Alberts, MD;  Location: [place];  Service: Open Heart Surgery;  Laterality: N/A;   RIGHT HEART CATH N/A 02/03/2018   Procedure: RIGHT HEART CATH;  Surgeon: Martinique, Peter M, MD;  Location: [place] CV LAB;  Service: Cardiovascular;  Laterality: N/A;   RIGHT HEART CATH N/A 05/09/2018   Procedure: RIGHT HEART CATH;  Surgeon: Jolaine Artist, MD;  Location: [place] CV LAB;  Service: Cardiovascular;  Laterality: N/A;   TEE WITHOUT CARDIOVERSION N/A 02/08/2018   Procedure: TRANSESOPHAGEAL ECHOCARDIOGRAM (TEE);  Surgeon: Rexene Alberts, MD;  Location: [place];  Service: Open Heart Surgery;  Laterality: N/A;   TUBAL LIGATION     VIDEO BRONCHOSCOPY WITH ENDOBRONCHIAL ULTRASOUND N/A 03/11/2014   Procedure: VIDEO BRONCHOSCOPY WITH ENDOBRONCHIAL ULTRASOUND;  Surgeon: Melrose Nakayama, MD;  Location: [facility] OR;  Service: Thoracic;  Laterality: N/A;    Current Outpatient Medications  Medication Sig Dispense Refill   albuterol (VENTOLIN HFA) 108 (90 Base) MCG/ACT inhaler Inhale 2 puffs into the  lungs every 6 (six) hours as needed for wheezing or shortness of breath. 1 each 0   apixaban (ELIQUIS) 5 MG TABS tablet Take 1 tablet (5 mg total) by mouth 2 (two) times daily. 60 tablet 6   atorvastatin (LIPITOR) 80 MG tablet Take 1 tablet (80 mg total) by mouth daily at 6 PM. 30 tablet 1   blood glucose meter kit and supplies KIT Dispense based on patient and insurance preference. Use up to four times daily as directed. 1 each 0   CORLANOR 7.5 MG TABS tablet TAKE 1 TABLET (7.5 MG TOTAL) BY MOUTH 2 (TWO) TIMES DAILY WITH A MEAL. 180 tablet 1   FARXIGA 10 MG TABS tablet TAKE 1 TABLET (10 MG TOTAL) BY MOUTH DAILY BEFORE BREAKFAST. 30 tablet 6   Glucerna (GLUCERNA) LIQD Take 237 mLs by mouth See admin instructions. Takes 1 bottle twice a week     ipratropium-albuterol (DUONEB) 0.5-2.5 (3) MG/3ML SOLN Take 3 mLs by nebulization every 4 (four) hours as needed (for shortness of breath).     metolazone (ZAROXOLYN) 2.5 MG tablet Take 2.5 mg by mouth daily as needed (edema).     Multiple Vitamin (MULTIVITAMIN WITH MINERALS) TABS tablet Take 1 tablet by mouth daily.     naphazoline-pheniramine (VISINE) 0.025-0.3 %  ophthalmic solution Place 1 drop into both eyes 4 (four) times daily as needed for eye irritation.     nitroGLYCERIN (NITROSTAT) 0.4 MG SL tablet Place 1 tablet (0.4 mg total) under the tongue every 5 (five) minutes as needed for chest pain. 90 tablet 5   OVER THE COUNTER MEDICATION Place 1 spray into both nostrils daily as needed (for congestion).     spironolactone (ALDACTONE) 25 MG tablet Take 0.5 tablets (12.5 mg total) by mouth daily. 45 tablet 3   torsemide (DEMADEX) 20 MG tablet Take 3 tablets (60 mg total) by mouth daily. 60 tablet 1   No current facility-administered medications for this visit.    Allergies:   Codeine   Social History: Social History   Socioeconomic History   Marital status: Married    Spouse name: Michell Heinrich    Number of children: 1   Years of education:  Not on file   Highest education level: Not on file  Occupational History   Not on file  Tobacco Use   Smoking status: Former    Packs/day: 1.00    Years: 20.00    Pack years: 20.00    Types: Cigarettes    Quit date: 03/13/2014    Years since quitting: 8.1   Smokeless tobacco: Never  Vaping Use   Vaping Use: Never used  Substance and Sexual Activity   Alcohol use: Yes    Alcohol/week: 8.0 standard drinks    Types: 8 Glasses of wine per week   Drug use: Yes    Types: Marijuana    Comment: "medicinal"   Sexual activity: Never  Other Topics Concern   Not on file  Social History Narrative   Not on file   Social Determinants of Health   Financial Resource Strain: Not on file  Food Insecurity: Not on file  Transportation Needs: Unmet Transportation Needs   Lack of Transportation (Medical): Yes   Lack of Transportation (Non-Medical): No  Physical Activity: Not on file  Stress: Not on file  Social Connections: Not on file  Intimate Partner Violence: Not on file    Family History: Family History  Problem Relation Age of Onset   Heart disease Mother    Hypertension Mother    Heart attack Mother    Hypertension Maternal Grandmother    Cancer Maternal Grandmother    Diabetes Paternal Grandmother    Stroke Neg Hx     Review of systems complete and found to be negative unless listed in HPI.    Physical Exam: There were no vitals filed for this visit.   General: Pleasant, NAD. No resp difficulty Psych: Normal affect. HEENT:  Normal, without mass or lesion.         Neck: Supple, no bruits or JVD. Carotids 2+. No lymphadenopathy/thyromegaly appreciated. Heart: PMI nondisplaced. RRR no s3, s4, or murmurs. Lungs:  Resp regular and unlabored, CTA. Abdomen: Soft, non-tender, non-distended, No HSM, BS + x 4.   Extremities: No clubbing, cyanosis or edema. DP/PT/Radials 2+ and equal bilaterally. Neuro: Alert and oriented X 3. Moves all extremities spontaneously.   Barostim  Interrogation- Performed personally and reviewed in detail today,  See scanned report  EKG:  EKG is not ordered today.  Recent Labs: 07/19/2021: NT-Pro BNP 3,162 04/12/2022: ALT 24; Magnesium 2.1 04/25/2022: B Natriuretic Peptide 1,232.9; BUN 17; Creatinine, Ser 1.18; Hemoglobin 12.2; Platelets 186; Potassium 3.9; Sodium 141   Wt Readings from Last 3 Encounters:  05/03/22 101 lb 3.2 oz (45.9 kg)  04/25/22 103 lb (46.7 kg)  04/15/22 100 lb 1.6 oz (45.4 kg)     Other studies Reviewed: Additional studies/ records that were reviewed today include: Previous EP and HF notes  Assessment and Plan:  1. Chronic systolic CHF s/p Medtronic and Barostim implantation NYHA II-III symptoms.   Device titrated from 8.2 millamp to *** milliamp by increments of 0.4 with good blood pressure response and no stimulation.  Device impedence stable Pt goals are to do yoga, exercise, and do her ADLs without difficulty, fatigue, or SOB. She is already making progress toward this  Normal device function See scanned report.  2. CAD s/p CABG Denies s/s ischemia  3. COPD Wears O2 intermittently.   4. PAF Low burden chronically Not on [place] due to recurrent hemoptysis  Current medicines are reviewed at length with the patient today.   The patient does not have concerns regarding her medicines.  The following changes were made today:  none   Disposition:   Follow up ***   Signed, Shirley Friar, PA-C  05/10/2022 10:40 AM  [facility] [address] [place] [address] [phone] (office) [phone] (fax)

## 2022-05-11 ENCOUNTER — Encounter: Payer: BC Managed Care – PPO | Admitting: Physician Assistant

## 2022-05-15 ENCOUNTER — Telehealth: Payer: Self-pay

## 2022-05-15 NOTE — Telephone Encounter (Signed)
Attempted call to patient to send manual ICM Remote Transmission and no answer or voice mail option.

## 2022-05-15 NOTE — Telephone Encounter (Signed)
Patient returned call and stated she left her monitor at a friends house but will be returning tonight.  She will send remote transmission on 6/6.

## 2022-05-16 ENCOUNTER — Ambulatory Visit (INDEPENDENT_AMBULATORY_CARE_PROVIDER_SITE_OTHER): Payer: Medicare Other

## 2022-05-16 DIAGNOSIS — Z95810 Presence of automatic (implantable) cardiac defibrillator: Secondary | ICD-10-CM

## 2022-05-16 DIAGNOSIS — I5022 Chronic systolic (congestive) heart failure: Secondary | ICD-10-CM

## 2022-05-16 NOTE — Progress Notes (Signed)
EPIC Encounter for ICM Monitoring  Patient Name: Brittany [name] is a 61 y.o. female Date: 05/16/2022 Primary Care Physican: Traci Sermon, PA-C Primary Cardiologist: [place] Electrophysiologist: [place] 03/24/2022 Weight: 101 lbs 04/19/2022 Weight: 101 lbs 05/16/2022 Weight: 101 lbs   Spoke with patient and heart failure questions reviewed.  Pt took Metolazone last week and cut back fluid intake which may have resulted in slight dryness but impedance is back to normal today.     Optivol Thoracic impedance suggesting fluid levels returned to normal after taking Metolazone.  Barostim implant.   Prescribed: Torsemide 20 mg Take 60 mg by mouth daily.  Metolazone 2.5 mg take by mouth as needed for fluid  Spironolactone 25 mg take 0.5 tablet by mouth daily Farxiga 10 mg take 1 tablet daily   Labs: 04/25/2022 Creatinine 1.18, BUN 17, Potassium 3.9, Sodium 141, GFR 53 04/16/2022 Creatinine 1.16, BUN 40, Potassium 4.3, Sodium 136, GFR 54 04/15/2022 Creatinine 1.39, BUN 43, Potassium 3.8, Sodium 136, GFR 43  04/14/2022 Creatinine 1.27, BUN 40, Potassium 4.3, Sodium 136, GFR 48  04/13/2022 Creatinine 1.09, BUN 46, Potassium 4.2, Sodium 136, GFR 58  04/12/2022 Creatinine 1.38, BUN 41, Potassium 3.9, Sodium 137, GFR 44  04/11/2022 Creatinine 1.29, BUN 38, Potassium 4.1, Sodium 138, GFR 47  04/10/2022 Creatinine 1.17, BUN 29, Potassium 4.3, Sodium 138, GFR 53  04/09/2022 Creatinine 1.20, BUN 21, Potassium 3.4, Sodium 138, GFR 52  A complete set of results can be found in Results Review.   Recommendations:  No changes and encouraged to call if experiencing any fluid symptoms.    Follow-up plan: ICM clinic phone appointment on 05/22/2022.   91 day device clinic remote transmission 06/21/2022.     EP/Cardiology Office Visits:  05/17/2022 with Oda Kilts, PA for Barostim follow up.   07/24/2022 with Dr [name].   Copy of ICM check sent to Dr. Rayann Heman.  3 month ICM trend:  05/16/2022.    12-14 Month ICM trend:     [name], RN 05/16/2022 2:05 PM

## 2022-05-17 ENCOUNTER — Encounter (HOSPITAL_COMMUNITY): Payer: Self-pay

## 2022-05-17 ENCOUNTER — Encounter: Payer: Medicare Other | Admitting: Student

## 2022-05-17 ENCOUNTER — Ambulatory Visit (HOSPITAL_COMMUNITY)
Admission: RE | Admit: 2022-05-17 | Discharge: 2022-05-17 | Disposition: A | Discharge status: Home / Self Care | Payer: BC Managed Care – PPO | Source: Ambulatory Visit | Attending: Family Medicine | Admitting: Family Medicine

## 2022-05-17 VITALS — BP 116/70 | HR 79 | Wt 99.4 lb

## 2022-05-17 DIAGNOSIS — Z85118 Personal history of other malignant neoplasm of bronchus and lung: Secondary | ICD-10-CM | POA: Insufficient documentation

## 2022-05-17 DIAGNOSIS — I480 Paroxysmal atrial fibrillation: Secondary | ICD-10-CM | POA: Diagnosis not present

## 2022-05-17 DIAGNOSIS — J439 Emphysema, unspecified: Secondary | ICD-10-CM | POA: Insufficient documentation

## 2022-05-17 DIAGNOSIS — J9611 Chronic respiratory failure with hypoxia: Secondary | ICD-10-CM | POA: Insufficient documentation

## 2022-05-17 DIAGNOSIS — I110 Hypertensive heart disease with heart failure: Secondary | ICD-10-CM | POA: Insufficient documentation

## 2022-05-17 DIAGNOSIS — Z7901 Long term (current) use of anticoagulants: Secondary | ICD-10-CM | POA: Diagnosis not present

## 2022-05-17 DIAGNOSIS — Z79899 Other long term (current) drug therapy: Secondary | ICD-10-CM | POA: Insufficient documentation

## 2022-05-17 DIAGNOSIS — I5022 Chronic systolic (congestive) heart failure: Secondary | ICD-10-CM

## 2022-05-17 DIAGNOSIS — J449 Chronic obstructive pulmonary disease, unspecified: Secondary | ICD-10-CM

## 2022-05-17 DIAGNOSIS — Z951 Presence of aortocoronary bypass graft: Secondary | ICD-10-CM | POA: Insufficient documentation

## 2022-05-17 DIAGNOSIS — N280 Ischemia and infarction of kidney: Secondary | ICD-10-CM | POA: Diagnosis present

## 2022-05-17 DIAGNOSIS — I252 Old myocardial infarction: Secondary | ICD-10-CM | POA: Insufficient documentation

## 2022-05-17 DIAGNOSIS — I2510 Atherosclerotic heart disease of native coronary artery without angina pectoris: Secondary | ICD-10-CM | POA: Insufficient documentation

## 2022-05-17 DIAGNOSIS — C3490 Malignant neoplasm of unspecified part of unspecified bronchus or lung: Secondary | ICD-10-CM

## 2022-05-17 DIAGNOSIS — Z9221 Personal history of antineoplastic chemotherapy: Secondary | ICD-10-CM | POA: Diagnosis not present

## 2022-05-17 DIAGNOSIS — Z923 Personal history of irradiation: Secondary | ICD-10-CM | POA: Insufficient documentation

## 2022-05-17 DIAGNOSIS — Z7984 Long term (current) use of oral hypoglycemic drugs: Secondary | ICD-10-CM | POA: Diagnosis not present

## 2022-05-17 DIAGNOSIS — Z87891 Personal history of nicotine dependence: Secondary | ICD-10-CM | POA: Insufficient documentation

## 2022-05-17 DIAGNOSIS — J90 Pleural effusion, not elsewhere classified: Secondary | ICD-10-CM

## 2022-05-17 DIAGNOSIS — Z95810 Presence of automatic (implantable) cardiac defibrillator: Secondary | ICD-10-CM

## 2022-05-17 LAB — BASIC METABOLIC PANEL
Anion gap: 9 (ref 5–15)
BUN: 33 mg/dL — ABNORMAL HIGH (ref 8–23)
CO2: 34 mmol/L — ABNORMAL HIGH (ref 22–32)
Calcium: 10.5 mg/dL — ABNORMAL HIGH (ref 8.9–10.3)
Chloride: 94 mmol/L — ABNORMAL LOW (ref 98–111)
Creatinine, Ser: 1.46 mg/dL — ABNORMAL HIGH (ref 0.44–1.00)
GFR, Estimated: 41 mL/min — ABNORMAL LOW (ref >60)
Glucose, Bld: 105 mg/dL — ABNORMAL HIGH (ref 70–99)
Potassium: 2.9 mmol/L — ABNORMAL LOW (ref 3.5–5.1)
Sodium: 137 mmol/L (ref 135–145)

## 2022-05-17 NOTE — Progress Notes (Signed)
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 Underwent emergent cath 02/03/18 showed LAD 100% stenosed, LCx 95% stenosed. Taken for emergent CABG 02/03/18. Required impella post op. Hospital course complicated by cardiogenic shock, HCAP, A fib, respiratory failure, and swallowing issues. She was discharged to SNF. Discharge weight 103 pounds.    In 2019 had multiple hospitalizations for HF and pleural effusion. Underwent pleurodesis at [facility].   Admitted 3/20 with NSTEMI and HF. Received DES to ostial ramus into distal left main based on cath below and underwent diuresis. Meds adjusted as tolerated. Echo with EF 25-30%.   Admitted 11/21 with COPD flare with mild HF and hemoptysis. CTA showed stable scarring in the right hilum consistent with emphysema.. Underwent bronchoscopy on 11/15, no obvious source of bleeding found on examination.   Echo 11/21 EF 20-25%. RV mildly HK.   Admitted 3/22 with chest pain and shortness of breath. HS-Trop with no trend. Echo with EF < 20%. Treated for AECOPD exacerbation. Breathing improved. Discharged on torsemide 60 mg daily.  Admitted 4/22 with ADHF. Diuresed with IV lasix and transitioned back to torsemide 80 mg daily x 4 days then down to torsemide 60 mg daily.   Admitted 12/22 with R renal infarct felt to be cardio-embolic (no AF found). Started on Eliquis  Was seen in ER 4/23 for worsening SOB. CXR ok. Given 1 dose of IV lasix and nebs.   Follow up 03/24/22, breathing improved, fluid OK on OptiVol and  exam, weight 101 lbs.  Admitted 5/23 with a/c CHF and COPD. Diuresed with IV lasix. Treated with doxy, pred and nebs for COPD exacerbation. Underwent Barostim 04/14/22. Discharged home, weight 100 lbs.  Today she returns for HF follow up. Overall feeling much better after taking a metolazone dose this week. She is not short of breath with walking on flat ground. Feels better now with Barostim and can make her king-sized bed without dyspnea. Denies palpitations, CP, dizziness, edema, or PND/Orthopnea. Appetite ok. No fever or chills. Weight at home down a couple lbs. Taking all medications. Trying to do better with reducing fast food.   Cardidac Studies: ECHO 1/23: EF 20-25%, severely reduced LV with global HK, grade II DD, normal RV ECHO 02/10/21: EF <20%, G2DD ECHO 2022: EF < 20% RV normal  ECHO 2021: EF 20-25% RV mildly reduced.  ECHO 02/12/19: EF 25-30%, RV mildly reduced  05/18/2021 PFT's: moderately severe airway obstruction and a diffusion defect suggesting emphysema.   LHC 02/12/19: Ost LAD to Prox LAD lesion is 100% stenosed. Ost Cx to Prox Cx lesion is 100% stenosed. Ost Ramus lesion is 99% stenosed. Post intervention, there is a 0% residual stenosis. A drug-eluting stent was successfully placed using a Webster 2.25X15. SVG and is normal in caliber. The graft exhibits no disease. Ost 1st Diag lesion is 70% stenosed. SVG and is normal in caliber. Prox Graft lesion is 100% stenosed. 1.  Significant underlying two-vessel coronary artery disease with patent SVG to OM and SVG to diagonal which supplies the  LAD territory.  Atretic LIMA to LAD given competitive flow from SVG to diagonal.  Severe ostial ramus artery stenosis not supplied by bypass with his area suggestive of plaque rupture. 2.  Severely elevated left ventricular end-diastolic pressure 34 mmHg.  Left ventricular angiography was not performed. 3.  Successful angioplasty and drug-eluting stent placement to the ostial  ramus extending into the distal left main.  [place] 2019  RA = 8 RV = 49/9 PA = 47/20 (32) PCW = 19 Fick cardiac output/index = 4.3/3.1 PVR = 3.0 wu AO sat = 93% PA sat = 62%, 64%  Assessment: 1. Minimally elevated left heart pressures 2. Mild PAH 3. Normal cardiac output  Past Medical History:  Diagnosis Date   Acute respiratory failure ([place]) 70/96/2836   Acute systolic congestive heart failure ([place]) 02/03/2018   AICD (automatic cardioverter/defibrillator) present    Allergy    Anxiety    Asthma    DM2 (diabetes mellitus, type 2) ([place]) 10/20/2020   Hypertension    PAF (paroxysmal atrial fibrillation) ([place])    Presence of permanent cardiac pacemaker    Prophylactic measure 08/03/14-08/19/14   Prophyl. 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   LIMA to LAD, SVG to D1, SVG to OM1, EVH via right thigh with implantation of Impella LD LVAD via direct aortic approach   Small cell lung cancer ([place]) 03/16/2014   Past Surgical History:  Procedure Laterality Date   BRONCHIAL BRUSHINGS  10/25/2020   Procedure: BRONCHIAL BRUSHINGS;  Surgeon: Collene Gobble, MD;  Location: [facility] ENDOSCOPY;  Service: Pulmonary;;   BRONCHIAL NEEDLE ASPIRATION BIOPSY  10/25/2020   Procedure: BRONCHIAL NEEDLE ASPIRATION BIOPSIES;  Surgeon: Collene Gobble, MD;  Location: [place] ENDOSCOPY;  Service: Pulmonary;;   CARDIAC DEFIBRILLATOR PLACEMENT  08/15/2018   MDT Visia AF MRI VR ICD implanted by Dr Loel Lofty for primary prevention of sudden   CESAREAN SECTION     CORONARY ARTERY BYPASS GRAFT N/A 02/03/2018   Procedure: CORONARY ARTERY BYPASS GRAFTING (CABG);  Surgeon: Rexene Alberts, MD;  Location: [place];  Service: Open Heart Surgery;  Laterality: N/A;  Time 3 using left internal mammary artery and endoscopically harvested right saphenous vein   CORONARY BALLOON ANGIOPLASTY N/A 02/03/2018   Procedure: CORONARY BALLOON ANGIOPLASTY;  Surgeon: Martinique, Peter M, MD;  Location: [place] CV LAB;  Service:  Cardiovascular;  Laterality: N/A;   CORONARY STENT INTERVENTION N/A 02/12/2019   Procedure: CORONARY STENT INTERVENTION;  Surgeon: Wellington Hampshire, MD;  Location: [place] CV LAB;  Service: Cardiovascular;  Laterality: N/A;   CORONARY/GRAFT ACUTE MI REVASCULARIZATION N/A 02/03/2018   Procedure: Coronary/Graft Acute MI Revascularization;  Surgeon: Martinique, Peter M, MD;  Location: [place] CV LAB;  Service: Cardiovascular;  Laterality: N/A;   ENDOBRONCHIAL ULTRASOUND N/A 10/25/2020   Procedure: ENDOBRONCHIAL ULTRASOUND;  Surgeon: Collene Gobble, MD;  Location: [facility] ENDOSCOPY;  Service: Pulmonary;  Laterality: N/A;   FLEXIBLE BRONCHOSCOPY  10/25/2020   Procedure: FLEXIBLE BRONCHOSCOPY;  Surgeon: Collene Gobble, MD;  Location: [facility] ENDOSCOPY;  Service: Pulmonary;;   IABP INSERTION N/A 02/03/2018   Procedure: IABP Insertion;  Surgeon: Martinique, Peter M, MD;  Location: [place] CV LAB;  Service: Cardiovascular;  Laterality: N/A;   INTRAOPERATIVE TRANSESOPHAGEAL ECHOCARDIOGRAM N/A 02/03/2018   Procedure: INTRAOPERATIVE TRANSESOPHAGEAL ECHOCARDIOGRAM;  Surgeon: Rexene Alberts, MD;  Location: [place];  Service: Open Heart Surgery;  Laterality: N/A;   LEFT HEART CATH AND CORONARY ANGIOGRAPHY N/A 02/03/2018   Procedure:  LEFT HEART CATH AND CORONARY ANGIOGRAPHY;  Surgeon: Martinique, Peter M, MD;  Location: [place] CV LAB;  Service: Cardiovascular;  Laterality: N/A;   LEFT HEART CATH AND CORS/GRAFTS ANGIOGRAPHY N/A 02/12/2019   Procedure: LEFT HEART CATH AND CORS/GRAFTS ANGIOGRAPHY;  Surgeon: Wellington Hampshire, MD;  Location: [place] CV LAB;  Service: Cardiovascular;  Laterality: N/A;   MEDIASTINOSCOPY N/A 03/11/2014   Procedure: MEDIASTINOSCOPY;  Surgeon: Melrose Nakayama, MD;  Location: [place];  Service: Thoracic;  Laterality: N/A;   PLACEMENT OF IMPELLA LEFT VENTRICULAR ASSIST DEVICE  02/03/2018   Procedure: PLACEMENT OF [place] LEFT VENTRICULAR ASSIST DEVICE LD;  Surgeon: Rexene Alberts, MD;   Location: [place];  Service: Open Heart Surgery;;   REMOVAL OF [place] LEFT VENTRICULAR ASSIST DEVICE N/A 02/08/2018   Procedure: REMOVAL OF [place] LEFT VENTRICULAR ASSIST DEVICE;  Surgeon: Rexene Alberts, MD;  Location: [place];  Service: Open Heart Surgery;  Laterality: N/A;   RIGHT HEART CATH N/A 02/03/2018   Procedure: RIGHT HEART CATH;  Surgeon: Martinique, Peter M, MD;  Location: [place] CV LAB;  Service: Cardiovascular;  Laterality: N/A;   RIGHT HEART CATH N/A 05/09/2018   Procedure: RIGHT HEART CATH;  Surgeon: Jolaine Artist, MD;  Location: [place] CV LAB;  Service: Cardiovascular;  Laterality: N/A;   TEE WITHOUT CARDIOVERSION N/A 02/08/2018   Procedure: TRANSESOPHAGEAL ECHOCARDIOGRAM (TEE);  Surgeon: Rexene Alberts, MD;  Location: [place];  Service: Open Heart Surgery;  Laterality: N/A;   TUBAL LIGATION     VIDEO BRONCHOSCOPY WITH ENDOBRONCHIAL ULTRASOUND N/A 03/11/2014   Procedure: VIDEO BRONCHOSCOPY WITH ENDOBRONCHIAL ULTRASOUND;  Surgeon: Melrose Nakayama, MD;  Location: [facility] OR;  Service: Thoracic;  Laterality: N/A;   Current Outpatient Medications  Medication Sig Dispense Refill   albuterol (VENTOLIN HFA) 108 (90 Base) MCG/ACT inhaler Inhale 2 puffs into the lungs every 6 (six) hours as needed for wheezing or shortness of breath. 1 each 0   apixaban (ELIQUIS) 5 MG TABS tablet Take 1 tablet (5 mg total) by mouth 2 (two) times daily. 60 tablet 6   atorvastatin (LIPITOR) 80 MG tablet Take 1 tablet (80 mg total) by mouth daily at 6 PM. 30 tablet 1   blood glucose meter kit and supplies KIT Dispense based on patient and insurance preference. Use up to four times daily as directed. 1 each 0   CORLANOR 7.5 MG TABS tablet TAKE 1 TABLET (7.5 MG TOTAL) BY MOUTH 2 (TWO) TIMES DAILY WITH A MEAL. 180 tablet 1   FARXIGA 10 MG TABS tablet TAKE 1 TABLET (10 MG TOTAL) BY MOUTH DAILY BEFORE BREAKFAST. 30 tablet 6   Glucerna (GLUCERNA) LIQD Take 237 mLs by mouth See admin instructions. Takes 1  bottle twice a week     ipratropium-albuterol (DUONEB) 0.5-2.5 (3) MG/3ML SOLN Take 3 mLs by nebulization every 4 (four) hours as needed (for shortness of breath).     metolazone (ZAROXOLYN) 2.5 MG tablet Take 2.5 mg by mouth daily as needed (edema).     Multiple Vitamin (MULTIVITAMIN WITH MINERALS) TABS tablet Take 1 tablet by mouth daily.     naphazoline-pheniramine (VISINE) 0.025-0.3 % ophthalmic solution Place 1 drop into both eyes 4 (four) times daily as needed for eye irritation.     nitroGLYCERIN (NITROSTAT) 0.4 MG SL tablet Place 1 tablet (0.4 mg total) under the tongue every 5 (five) minutes as needed for chest pain. 90 tablet 5   OVER THE COUNTER MEDICATION Place 1 spray into  both nostrils daily as needed (for congestion).     spironolactone (ALDACTONE) 25 MG tablet Take 0.5 tablets (12.5 mg total) by mouth daily. 45 tablet 3   torsemide (DEMADEX) 20 MG tablet Patient takes 2 tablet by mouth once a day and 1 extra tablet if weight increases 3-5 pounds in one day.     No current facility-administered medications for this encounter.    Allergies:   Codeine   Social History:  The patient  reports that she quit smoking about 8 years ago. Her smoking use included cigarettes. She has a 20.00 pack-year smoking history. She has never used smokeless tobacco. She reports current alcohol use of about 8.0 standard drinks per week. She reports current drug use. Drug: Marijuana.   Family History:  The patient's family history includes Cancer in her maternal grandmother; Diabetes in her paternal grandmother; Heart attack in her mother; Heart disease in her mother; Hypertension in her maternal grandmother and mother.   ROS:  Please see the history of present illness.   All other systems are personally reviewed and negative.    Recent Labs: 07/19/2021: NT-Pro BNP 3,162 04/12/2022: ALT 24; Magnesium 2.1 04/25/2022: B Natriuretic Peptide 1,232.9; BUN 17; Creatinine, Ser 1.18; Hemoglobin 12.2; Platelets  186; Potassium 3.9; Sodium 141  Personally reviewed   Wt Readings from Last 3 Encounters:  05/17/22 45.1 kg (99 lb 6.4 oz)  05/03/22 45.9 kg (101 lb 3.2 oz)  04/25/22 46.7 kg (103 lb)   BP 116/70   Pulse 79   Wt 45.1 kg (99 lb 6.4 oz)   SpO2 100%   BMI 17.06 kg/m   Physical Exam General:  NAD. No resp difficulty HEENT: Normal Neck: Supple. No JVD. Carotids 2+ bilat; no bruits. No lymphadenopathy or thryomegaly appreciated. Cor: PMI nondisplaced. Regular rate & rhythm. No rubs, gallops or murmurs. Lungs: Clear Abdomen: Soft, nontender, nondistended. No hepatosplenomegaly. No bruits or masses. Good bowel sounds. Extremities: No cyanosis, clubbing, rash, edema Neuro: Alert & oriented x 3, cranial nerves grossly intact. Moves all 4 extremities w/o difficulty. Affect pleasant.  Device interrogation (personally reviewed): OptiVol down, thoracic impedence up no VT or AF, 3 hrs day/activity.  Assessment & Plan: 1. 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  - Echo 02/12/19: EF 25-30%, RV mildly reduced - Echo 11/21 EF 20-25% RV mild HK. - Echo 2022 EF < 20% RV normal - Echo 1/23 EF 20-25% RV ok  - Has Medtronic single chamber ICD  - Improved NYHA II symptoms. Volume status looks good on exam and by OptiVol. - Continue torsemide 60 mg daily. - Failed Entresto due to hyperkalemia.   - Continue Farxiga 10 mg daily.  - Continue spiro 12.5 mg daily. - Continue Corlanor 7.5 mg bid. - No b-blocker due to intolerance with low output and bad COPD with frequent flares - Not a candidate for advanced therapies with her degree of lung disease.   - Now with Barostim in place. Device rep here today to adjust. - Labs today. - Discussed importance of adhering to low salt diet.   2. COPD - Breathing improved, lungs clear on exam today. - Recent flare treated with Doxy, prednisone and nebs    3.  Chronic Hypoxic Respiratory Failure  - Multifactorial, former smoker, h/o small cell lung cancer treated with chemo, chest XRT, COPD and HF - recent acute exacerbation 2/2 ACOPDE + a/c CHF  -  Appears back to baseline.    4. Recurrent pleural effusions s/p pleurodesis - Resolved. Recent CXR ok   5. PAF - CHA2DS2/VASc is at least 4. (CHF, Vasc disease, HTN, Female).   - She has history of Afib RVR in the past in the setting of her Lung CA.  - Previously on Xarelto but stopped due to hemoptysis.  - Started on Eliquis in 12/22 due to renal infarct. - Continue Eliquis 5 mg bid. No  bleeding issues.   6. 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 - Back on Eliquis     7. H/o SCLC:  - s/p treatment 2015. Lost to f/u since 04/2015.  - Chest CT 02/19/18 with mass-like consolidation in R hilum concerning for recurrent tumor.  - Repeat CT 03/27/2018 -No definite findings of locally recurrent tumor in the right lung. 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 Continued chest CT surveillance is advised in 3-6 months. - S/p thoracentesis 04/2018. Cytology negative for malignancy.  - CTA 11/19 stable scarring in the right hilum consistent with emphysema. Bronchoscopy on 11/15, no obvious source of bleeding   8. S/p Barostim Implant - skin glue intact, site stable. - Feels great since implant. - Device rep to adjust today.  Follow up in 2 months with Dr. [name] as scheduled.  Allena Katz, FNP-BC 05/17/22

## 2022-05-17 NOTE — Patient Instructions (Signed)
It was great to see you today! No medication changes are needed at this time.   Labs today We will only contact you if something comes back abnormal or we need to make some changes. Otherwise no news is good news!  Keep follow up as scheduled   Do the following things EVERYDAY: Weigh yourself in the morning before breakfast. Write it down and keep it in a log. Take your medicines as prescribed Eat low salt foods--Limit salt (sodium) to 2000 mg per day.  Stay as active as you can everyday Limit all fluids for the day to less than 2 liters  At the [facility], you and your health needs are our priority. As part of our continuing mission to provide you with exceptional heart care, we have created designated Provider Care Teams. These Care Teams include your primary Cardiologist (physician) and Advanced Practice Providers (APPs- Physician Assistants and Nurse Practitioners) who all work together to provide you with the care you need, when you need it.   You may see any of the following providers on your designated Care Team at your next follow up: Dr Glori Bickers Dr Haynes Kerns, NP Lyda Jester, [place] [facility] [place], [place] Audry Riles, PharmD   Please be sure to bring in all your medications bottles to every appointment.   If you have any questions or concerns before your next appointment please send [place] a message through [place] or call our office at [phone].    TO LEAVE A MESSAGE FOR THE NURSE SELECT OPTION 2, PLEASE LEAVE A MESSAGE INCLUDING: YOUR NAME DATE OF BIRTH CALL BACK NUMBER REASON FOR CALL**this is important as we prioritize the call backs  YOU WILL RECEIVE A CALL BACK THE SAME DAY AS LONG AS YOU CALL BEFORE 4:00 PM

## 2022-05-19 ENCOUNTER — Telehealth (HOSPITAL_COMMUNITY): Payer: Self-pay | Admitting: *Deleted

## 2022-05-19 DIAGNOSIS — I5022 Chronic systolic (congestive) heart failure: Secondary | ICD-10-CM

## 2022-05-19 MED ORDER — POTASSIUM CHLORIDE CRYS ER 20 MEQ PO TBCR: 40.0000 meq | EXTENDED_RELEASE_TABLET | Freq: Every day | ORAL | 3 refills | Status: DC

## 2022-05-19 MED ORDER — SPIRONOLACTONE 25 MG PO TABS: 25.0000 mg | ORAL_TABLET | Freq: Every day | ORAL | 3 refills | Status: DC

## 2022-05-19 NOTE — Telephone Encounter (Signed)
Pt aware, agreeable, and verbalized understanding, repeat labs sch, pt already has KCL at home, no rx is needed   Brittany Mina, RN  05/17/2022  1:21 PM EDT     Patient called. Unable to leave message

## 2022-05-19 NOTE — Telephone Encounter (Signed)
-----   Message from Rafael Bihari, [place] sent at 05/17/2022  1:03 PM EDT ----- K is very low.   Please take 120 mEq today x 1 (can divide into 2 60 mEq doses today).  Tomorrow, start 40 KCL daily and increase spiro to 25 mg daily.  Repeat BMET next week.

## 2022-05-22 ENCOUNTER — Ambulatory Visit (INDEPENDENT_AMBULATORY_CARE_PROVIDER_SITE_OTHER): Payer: BC Managed Care – PPO

## 2022-05-22 DIAGNOSIS — Z95810 Presence of automatic (implantable) cardiac defibrillator: Secondary | ICD-10-CM | POA: Diagnosis not present

## 2022-05-22 NOTE — Progress Notes (Signed)
EPIC Encounter for ICM Monitoring  Patient Name: Brittany [name] is a 61 y.o. female Date: 05/22/2022 Primary Care Physican: Traci Sermon, PA-C Primary Cardiologist: [place] Electrophysiologist: [place] 03/24/2022 Weight: 101 lbs 04/19/2022 Weight: 101 lbs 05/16/2022 Weight: 101 lbs   Spoke with patient and heart failure questions reviewed.  Pt felt like she had some fluid today and took 60 mg Torsemide today.   Per 6/9 lab note, KCL prescription started 6/9 and Spironolactone increased to 25 mg daily due to 6/7 low potassium results.   Pt unaware Spironolactone was increased to 25 mg daily on 6/9.   Optivol Thoracic impedance suggesting possible fluid accumulation starting 6/9.  Barostim implant.   Prescribed: Torsemide 20 mg kes 2 tablet by mouth once a day and 1 extra tablet if weight increases 3-5 pounds in one day. Potassium 20 mEq take 2 tablets (40 mEq total) by mouth daily Metolazone 2.5 mg take by mouth as needed for fluid  Spironolactone 25 mg take 0.5 tablet by mouth daily Farxiga 10 mg take 1 tablet daily   Labs: 05/24/2022 BMET scheduled due to to low potassium 05/17/2022 Creatinine 1.46, BUN 33, Potassium 2.9, Sodium 137, GFR 41 04/25/2022 Creatinine 1.18, BUN 17, Potassium 3.9, Sodium 141, GFR 53 04/16/2022 Creatinine 1.16, BUN 40, Potassium 4.3, Sodium 136, GFR 54 04/15/2022 Creatinine 1.39, BUN 43, Potassium 3.8, Sodium 136, GFR 43  04/14/2022 Creatinine 1.27, BUN 40, Potassium 4.3, Sodium 136, GFR 48  04/13/2022 Creatinine 1.09, BUN 46, Potassium 4.2, Sodium 136, GFR 58  04/12/2022 Creatinine 1.38, BUN 41, Potassium 3.9, Sodium 137, GFR 44  04/11/2022 Creatinine 1.29, BUN 38, Potassium 4.1, Sodium 138, GFR 47  04/10/2022 Creatinine 1.17, BUN 29, Potassium 4.3, Sodium 138, GFR 53  04/09/2022 Creatinine 1.20, BUN 21, Potassium 3.4, Sodium 138, GFR 52  A complete set of results can be found in Results Review.   Recommendations:   Advised patient lab note  instructions say to increase Spironolactone to 25 mg daily.  She has been taking KCL as instructed.  Advised will recheck fluid levels 6/14 after taking Torsemide 60 mg today.    Follow-up plan: ICM clinic phone appointment on 05/29/2022.   91 day device clinic remote transmission 06/21/2022.     EP/Cardiology Office Visits:    07/24/2022 with Dr [name].   Copy of ICM check sent to Dr. Rayann Heman.  Copy sent to Allena Katz, NP for review.    3 month ICM trend: 05/22/2022.    12-14 Month ICM trend:     [name], RN 05/22/2022 3:03 PM

## 2022-05-24 ENCOUNTER — Ambulatory Visit (HOSPITAL_COMMUNITY)
Admission: RE | Admit: 2022-05-24 | Discharge: 2022-05-24 | Disposition: A | Discharge status: Home / Self Care | Payer: BC Managed Care – PPO | Source: Ambulatory Visit | Attending: Internal Medicine | Admitting: Internal Medicine

## 2022-05-24 ENCOUNTER — Ambulatory Visit (INDEPENDENT_AMBULATORY_CARE_PROVIDER_SITE_OTHER): Payer: BC Managed Care – PPO

## 2022-05-24 ENCOUNTER — Telehealth (HOSPITAL_COMMUNITY): Payer: Self-pay | Admitting: Family Medicine

## 2022-05-24 DIAGNOSIS — I5022 Chronic systolic (congestive) heart failure: Secondary | ICD-10-CM

## 2022-05-24 DIAGNOSIS — Z95810 Presence of automatic (implantable) cardiac defibrillator: Secondary | ICD-10-CM

## 2022-05-24 LAB — BASIC METABOLIC PANEL
Anion gap: 10 (ref 5–15)
BUN: 18 mg/dL (ref 8–23)
CO2: 22 mmol/L (ref 22–32)
Calcium: 9.5 mg/dL (ref 8.9–10.3)
Chloride: 106 mmol/L (ref 98–111)
Creatinine, Ser: 1.15 mg/dL — ABNORMAL HIGH (ref 0.44–1.00)
GFR, Estimated: 54 mL/min — ABNORMAL LOW (ref >60)
Glucose, Bld: 132 mg/dL — ABNORMAL HIGH (ref 70–99)
Potassium: 4.3 mmol/L (ref 3.5–5.1)
Sodium: 138 mmol/L (ref 135–145)

## 2022-05-24 NOTE — Progress Notes (Signed)
EPIC Encounter for ICM Monitoring  Patient Name: Brittany [name] is a 61 y.o. female Date: 05/24/2022 Primary Care Physican: Traci Sermon, PA-C Primary Cardiologist: [place] Electrophysiologist: [place]red 03/24/2022 Weight: 101 lbs 04/19/2022 Weight: 101 lbs 05/16/2022 Weight: 101 lbs 05/24/2022 Weight: 101 lbs   Spoke with patient and heart failure questions reviewed.  Pt reports she has fluid because she was SOB last night when trying to sleep.     Optivol Thoracic impedance suggesting possible fluid accumulation continues since 6/9 and starting to trend back to baseline after taking Torsemide 60 mg on 6/12.   Barostim implant.   Prescribed: Torsemide 20 mg takes 2 tablet by mouth once a day and 1 extra tablet if weight increases 3-5 pounds in one day. Potassium 20 mEq take 2 tablets (40 mEq total) by mouth daily Metolazone 2.5 mg take by mouth as needed for fluid  Spironolactone 25 mg take 1 tablet (25 mg total) by mouth daily Farxiga 10 mg take 1 tablet daily   Labs: 05/24/2022 Waiting on BMET results 05/17/2022 Creatinine 1.46, BUN 33, Potassium 2.9, Sodium 137, GFR 41 04/25/2022 Creatinine 1.18, BUN 17, Potassium 3.9, Sodium 141, GFR 53 04/16/2022 Creatinine 1.16, BUN 40, Potassium 4.3, Sodium 136, GFR 54 04/15/2022 Creatinine 1.39, BUN 43, Potassium 3.8, Sodium 136, GFR 43  04/14/2022 Creatinine 1.27, BUN 40, Potassium 4.3, Sodium 136, GFR 48  04/13/2022 Creatinine 1.09, BUN 46, Potassium 4.2, Sodium 136, GFR 58  04/12/2022 Creatinine 1.38, BUN 41, Potassium 3.9, Sodium 137, GFR 44  04/11/2022 Creatinine 1.29, BUN 38, Potassium 4.1, Sodium 138, GFR 47  04/10/2022 Creatinine 1.17, BUN 29, Potassium 4.3, Sodium 138, GFR 53  04/09/2022 Creatinine 1.20, BUN 21, Potassium 3.4, Sodium 138, GFR 52  A complete set of results can be found in Results Review.   Recommendations:  Pt will take Torsemide 60 mg daily x 2 days and then return to 40 mg daily.  She asked HF Clinic  lab to have [facility] @ HF clinic to call her today and she missed her call.     Follow-up plan: ICM clinic phone appointment on 05/29/2022 to recheck fluid levels.   91 day device clinic remote transmission 06/21/2022.     EP/Cardiology Office Visits:    07/24/2022 with Dr [name].   Copy of ICM check sent to Dr. Rayann Heman.  Copy sent to [facility], NP for review and to inquire if patient should take extra Potassium when taking extra Torsemide.    3 month ICM trend: 05/24/2022.    12-14 Month ICM trend:     [name], RN 05/24/2022 12:05 PM

## 2022-05-24 NOTE — Progress Notes (Signed)
Received: Today [place], Maricela Bo, FNP  Delynn Pursley Panda, RN Yes, she can take an extra 20 KCL daily x 2 days while on increased torsemide dose.

## 2022-05-24 NOTE — Progress Notes (Signed)
Spoke with patient and advised Allena Katz, NP at HF clinic recommended to take 1 extra 20 mEq Potassium today and tomorrow when taking extra Torsemide.  She verbalized understanding.  Bennie Pierini reviewed today's lab results potassium has returned to normal.  She was pleased lab returned to normal.  Will recheck fluid levels 6/19.

## 2022-05-24 NOTE — Telephone Encounter (Signed)
Received message to call patient. Called number given and unable to leave message, as voice mailbox was full.   Allena Katz, FNP-BC 05/24/22

## 2022-05-29 ENCOUNTER — Ambulatory Visit (INDEPENDENT_AMBULATORY_CARE_PROVIDER_SITE_OTHER): Payer: BC Managed Care – PPO

## 2022-05-29 DIAGNOSIS — I5022 Chronic systolic (congestive) heart failure: Secondary | ICD-10-CM

## 2022-05-29 DIAGNOSIS — Z95810 Presence of automatic (implantable) cardiac defibrillator: Secondary | ICD-10-CM

## 2022-05-30 ENCOUNTER — Other Ambulatory Visit (HOSPITAL_COMMUNITY): Payer: BC Managed Care – PPO

## 2022-05-30 NOTE — Progress Notes (Signed)
EPIC Encounter for ICM Monitoring  Patient Name: Brittany [name] is a 61 y.o. female Date: 05/30/2022 Primary Care Physican: Traci Sermon, PA-C Primary Cardiologist: Ashley Electrophysiologist: Allred 03/24/2022 Weight: 101 lbs 04/19/2022 Weight: 101 lbs 05/16/2022 Weight: 101 lbs 05/24/2022 Weight: 101 lbs   Spoke with patient and heart failure questions reviewed.  Pt reports she is feeling fine.    Optivol Thoracic impedance suggesting fluid levels returned to normal.   Barostim implant.   Prescribed: Torsemide 20 mg takes 2 tablet by mouth once a day and 1 extra tablet if weight increases 3-5 pounds in one day. Potassium 20 mEq take 2 tablets (40 mEq total) by mouth daily Metolazone 2.5 mg take by mouth as needed for fluid  Spironolactone 25 mg take 1 tablet (25 mg total) by mouth daily Farxiga 10 mg take 1 tablet daily   Labs: 05/24/2022 Creatinine 1.15, BUN 18, Potassium 4.3, Sodium 138, GFR 54 05/17/2022 Creatinine 1.46, BUN 33, Potassium 2.9, Sodium 137, GFR 41 04/25/2022 Creatinine 1.18, BUN 17, Potassium 3.9, Sodium 141, GFR 53 04/16/2022 Creatinine 1.16, BUN 40, Potassium 4.3, Sodium 136, GFR 54 04/15/2022 Creatinine 1.39, BUN 43, Potassium 3.8, Sodium 136, GFR 43  04/14/2022 Creatinine 1.27, BUN 40, Potassium 4.3, Sodium 136, GFR 48  04/13/2022 Creatinine 1.09, BUN 46, Potassium 4.2, Sodium 136, GFR 58  04/12/2022 Creatinine 1.38, BUN 41, Potassium 3.9, Sodium 137, GFR 44  04/11/2022 Creatinine 1.29, BUN 38, Potassium 4.1, Sodium 138, GFR 47  04/10/2022 Creatinine 1.17, BUN 29, Potassium 4.3, Sodium 138, GFR 53  04/09/2022 Creatinine 1.20, BUN 21, Potassium 3.4, Sodium 138, GFR 52  A complete set of results can be found in Results Review.   Recommendations:  No changes and encouraged to call if experiencing any fluid symptoms.    Follow-up plan: ICM clinic phone appointment on 06/26/2022.   91 day device clinic remote transmission 06/21/2022.      EP/Cardiology Office Visits:    07/24/2022 with Dr [name].   Copy of ICM check sent to Dr. Rayann Heman.   3 month ICM trend: 05/29/2022.    12-14 Month ICM trend:     [name], RN 05/30/2022 3:53 PM

## 2022-06-01 ENCOUNTER — Telehealth (HOSPITAL_COMMUNITY): Payer: Self-pay

## 2022-06-01 NOTE — Telephone Encounter (Signed)
Patient called requesting that she get a letter stating that she has CHF and COPD and if she has any restrictions for example:lifting, pulling, working. She stated that she is trying to get qualified for some things but would not go into detail with me. Please advise.

## 2022-06-05 NOTE — Telephone Encounter (Signed)
Patient advised however She said she wants a letter stating that she has heart failure and COPD, shortness of breath at times, constant fluid overload, that her immune system is low and that she hasn't had the COVID shot. She is trying to be reinstated for some programs such as food assistance and etc. She does not want to be linked with our social team.  Please advise

## 2022-06-05 NOTE — Telephone Encounter (Signed)
Letter left up front for pick up. 

## 2022-06-08 ENCOUNTER — Other Ambulatory Visit (HOSPITAL_COMMUNITY): Payer: Self-pay | Admitting: *Deleted

## 2022-06-09 ENCOUNTER — Other Ambulatory Visit (HOSPITAL_COMMUNITY): Payer: Self-pay | Admitting: Student

## 2022-06-10 ENCOUNTER — Other Ambulatory Visit: Payer: Self-pay | Admitting: Cardiology

## 2022-06-10 ENCOUNTER — Other Ambulatory Visit (HOSPITAL_COMMUNITY): Payer: Self-pay | Admitting: Internal Medicine

## 2022-06-10 MED ORDER — IVABRADINE HCL 7.5 MG PO TABS: ORAL_TABLET | ORAL | 0 refills | Status: DC

## 2022-06-17 ENCOUNTER — Other Ambulatory Visit: Payer: Self-pay

## 2022-06-17 ENCOUNTER — Encounter (HOSPITAL_COMMUNITY): Payer: Self-pay

## 2022-06-17 ENCOUNTER — Emergency Department (HOSPITAL_COMMUNITY): Payer: BC Managed Care – PPO

## 2022-06-17 ENCOUNTER — Inpatient Hospital Stay (HOSPITAL_COMMUNITY)
Admission: EM | Admit: 2022-06-17 | Discharge: 2022-06-20 | DRG: 190 | Disposition: A | Discharge status: Home / Self Care | Payer: BC Managed Care – PPO | Attending: Internal Medicine | Admitting: Internal Medicine

## 2022-06-17 DIAGNOSIS — Z87891 Personal history of nicotine dependence: Secondary | ICD-10-CM

## 2022-06-17 DIAGNOSIS — I5022 Chronic systolic (congestive) heart failure: Secondary | ICD-10-CM | POA: Diagnosis not present

## 2022-06-17 DIAGNOSIS — I2510 Atherosclerotic heart disease of native coronary artery without angina pectoris: Secondary | ICD-10-CM | POA: Diagnosis present

## 2022-06-17 DIAGNOSIS — E119 Type 2 diabetes mellitus without complications: Secondary | ICD-10-CM | POA: Diagnosis present

## 2022-06-17 DIAGNOSIS — J9621 Acute and chronic respiratory failure with hypoxia: Secondary | ICD-10-CM | POA: Diagnosis not present

## 2022-06-17 DIAGNOSIS — Z885 Allergy status to narcotic agent status: Secondary | ICD-10-CM

## 2022-06-17 DIAGNOSIS — I480 Paroxysmal atrial fibrillation: Secondary | ICD-10-CM | POA: Diagnosis not present

## 2022-06-17 DIAGNOSIS — C3490 Malignant neoplasm of unspecified part of unspecified bronchus or lung: Secondary | ICD-10-CM | POA: Diagnosis present

## 2022-06-17 DIAGNOSIS — Z79899 Other long term (current) drug therapy: Secondary | ICD-10-CM

## 2022-06-17 DIAGNOSIS — E1169 Type 2 diabetes mellitus with other specified complication: Secondary | ICD-10-CM | POA: Diagnosis present

## 2022-06-17 DIAGNOSIS — Z923 Personal history of irradiation: Secondary | ICD-10-CM

## 2022-06-17 DIAGNOSIS — J441 Chronic obstructive pulmonary disease with (acute) exacerbation: Secondary | ICD-10-CM | POA: Diagnosis not present

## 2022-06-17 DIAGNOSIS — Z9221 Personal history of antineoplastic chemotherapy: Secondary | ICD-10-CM

## 2022-06-17 DIAGNOSIS — Z20822 Contact with and (suspected) exposure to covid-19: Secondary | ICD-10-CM | POA: Diagnosis present

## 2022-06-17 DIAGNOSIS — Z8249 Family history of ischemic heart disease and other diseases of the circulatory system: Secondary | ICD-10-CM

## 2022-06-17 DIAGNOSIS — E440 Moderate protein-calorie malnutrition: Secondary | ICD-10-CM | POA: Insufficient documentation

## 2022-06-17 DIAGNOSIS — J209 Acute bronchitis, unspecified: Secondary | ICD-10-CM | POA: Diagnosis present

## 2022-06-17 DIAGNOSIS — I130 Hypertensive heart and chronic kidney disease with heart failure and stage 1 through stage 4 chronic kidney disease, or unspecified chronic kidney disease: Secondary | ICD-10-CM | POA: Diagnosis present

## 2022-06-17 DIAGNOSIS — I255 Ischemic cardiomyopathy: Secondary | ICD-10-CM | POA: Diagnosis present

## 2022-06-17 DIAGNOSIS — Z85118 Personal history of other malignant neoplasm of bronchus and lung: Secondary | ICD-10-CM

## 2022-06-17 DIAGNOSIS — N1831 Chronic kidney disease, stage 3a: Secondary | ICD-10-CM

## 2022-06-17 DIAGNOSIS — Z7901 Long term (current) use of anticoagulants: Secondary | ICD-10-CM

## 2022-06-17 DIAGNOSIS — Z951 Presence of aortocoronary bypass graft: Secondary | ICD-10-CM

## 2022-06-17 DIAGNOSIS — Z833 Family history of diabetes mellitus: Secondary | ICD-10-CM

## 2022-06-17 DIAGNOSIS — E876 Hypokalemia: Secondary | ICD-10-CM | POA: Diagnosis present

## 2022-06-17 DIAGNOSIS — R0602 Shortness of breath: Secondary | ICD-10-CM

## 2022-06-17 DIAGNOSIS — J440 Chronic obstructive pulmonary disease with acute lower respiratory infection: Secondary | ICD-10-CM | POA: Diagnosis present

## 2022-06-17 DIAGNOSIS — Z7984 Long term (current) use of oral hypoglycemic drugs: Secondary | ICD-10-CM

## 2022-06-17 DIAGNOSIS — E1122 Type 2 diabetes mellitus with diabetic chronic kidney disease: Secondary | ICD-10-CM

## 2022-06-17 DIAGNOSIS — Z955 Presence of coronary angioplasty implant and graft: Secondary | ICD-10-CM

## 2022-06-17 DIAGNOSIS — Z681 Body mass index (BMI) 19 or less, adult: Secondary | ICD-10-CM

## 2022-06-17 DIAGNOSIS — Z809 Family history of malignant neoplasm, unspecified: Secondary | ICD-10-CM

## 2022-06-17 DIAGNOSIS — Z95810 Presence of automatic (implantable) cardiac defibrillator: Secondary | ICD-10-CM

## 2022-06-17 LAB — CBC WITH DIFFERENTIAL/PLATELET
Abs Immature Granulocytes: 0.02 10*3/uL (ref 0.00–0.07)
Basophils Absolute: 0 10*3/uL (ref 0.0–0.1)
Basophils Relative: 1 %
Eosinophils Absolute: 0.1 10*3/uL (ref 0.0–0.5)
Eosinophils Relative: 1 %
HCT: 50.3 % — ABNORMAL HIGH (ref 36.0–46.0)
Hemoglobin: 15.9 g/dL — ABNORMAL HIGH (ref 12.0–15.0)
Immature Granulocytes: 0 %
Lymphocytes Relative: 22 %
Lymphs Abs: 1.1 10*3/uL (ref 0.7–4.0)
MCH: 29.6 pg (ref 26.0–34.0)
MCHC: 31.6 g/dL (ref 30.0–36.0)
MCV: 93.7 fL (ref 80.0–100.0)
Monocytes Absolute: 0.8 10*3/uL (ref 0.1–1.0)
Monocytes Relative: 15 %
Neutro Abs: 3 10*3/uL (ref 1.7–7.7)
Neutrophils Relative %: 61 %
Platelets: 153 10*3/uL (ref 150–400)
RBC: 5.37 MIL/uL — ABNORMAL HIGH (ref 3.87–5.11)
RDW: 20 % — ABNORMAL HIGH (ref 11.5–15.5)
WBC: 4.9 10*3/uL (ref 4.0–10.5)
nRBC: 0 % (ref 0.0–0.2)

## 2022-06-17 LAB — COMPREHENSIVE METABOLIC PANEL
ALT: 18 U/L (ref 0–44)
AST: 30 U/L (ref 15–41)
Albumin: 4.5 g/dL (ref 3.5–5.0)
Alkaline Phosphatase: 83 U/L (ref 38–126)
Anion gap: 17 — ABNORMAL HIGH (ref 5–15)
BUN: 26 mg/dL — ABNORMAL HIGH (ref 8–23)
CO2: 24 mmol/L (ref 22–32)
Calcium: 9.7 mg/dL (ref 8.9–10.3)
Chloride: 97 mmol/L — ABNORMAL LOW (ref 98–111)
Creatinine, Ser: 1.55 mg/dL — ABNORMAL HIGH (ref 0.44–1.00)
GFR, Estimated: 38 mL/min — ABNORMAL LOW (ref >60)
Glucose, Bld: 102 mg/dL — ABNORMAL HIGH (ref 70–99)
Potassium: 3.6 mmol/L (ref 3.5–5.1)
Sodium: 138 mmol/L (ref 135–145)
Total Bilirubin: 1.1 mg/dL (ref 0.3–1.2)
Total Protein: 8.1 g/dL (ref 6.5–8.1)

## 2022-06-17 LAB — I-STAT VENOUS BLOOD GAS, ED
Acid-Base Excess: 3 mmol/L — ABNORMAL HIGH (ref 0.0–2.0)
Bicarbonate: 29 mmol/L — ABNORMAL HIGH (ref 20.0–28.0)
Calcium, Ion: 1.1 mmol/L — ABNORMAL LOW (ref 1.15–1.40)
HCT: 52 % — ABNORMAL HIGH (ref 36.0–46.0)
Hemoglobin: 17.7 g/dL — ABNORMAL HIGH (ref 12.0–15.0)
O2 Saturation: 99 %
Potassium: 3.6 mmol/L (ref 3.5–5.1)
Sodium: 137 mmol/L (ref 135–145)
TCO2: 30 mmol/L (ref 22–32)
pCO2, Ven: 46.9 mmHg (ref 44–60)
pH, Ven: 7.4 (ref 7.25–7.43)
pO2, Ven: 126 mmHg — ABNORMAL HIGH (ref 32–45)

## 2022-06-17 LAB — TROPONIN I (HIGH SENSITIVITY)
Troponin I (High Sensitivity): 40 ng/L — ABNORMAL HIGH (ref <18)
Troponin I (High Sensitivity): 41 ng/L — ABNORMAL HIGH (ref <18)

## 2022-06-17 LAB — MAGNESIUM: Magnesium: 2 mg/dL (ref 1.7–2.4)

## 2022-06-17 LAB — CBG MONITORING, ED: Glucose-Capillary: 195 mg/dL — ABNORMAL HIGH (ref 70–99)

## 2022-06-17 LAB — BRAIN NATRIURETIC PEPTIDE: B Natriuretic Peptide: 862.1 pg/mL — ABNORMAL HIGH (ref 0.0–100.0)

## 2022-06-17 MED ORDER — METHYLPREDNISOLONE SODIUM SUCC 125 MG IJ SOLR
125.0000 mg | Freq: Once | INTRAMUSCULAR | Status: AC
Start: 2022-06-17 — End: 2022-06-17
  Administered 2022-06-17: 125 mg via INTRAVENOUS
  Filled 2022-06-17: qty 2

## 2022-06-17 MED ORDER — INSULIN ASPART 100 UNIT/ML IJ SOLN
0.0000 [IU] | INTRAMUSCULAR | Status: DC
  Administered 2022-06-17: 2 [IU] via SUBCUTANEOUS
  Administered 2022-06-18: 5 [IU] via SUBCUTANEOUS
  Administered 2022-06-18: 2 [IU] via SUBCUTANEOUS
  Administered 2022-06-18: 3 [IU] via SUBCUTANEOUS
  Administered 2022-06-18: 1 [IU] via SUBCUTANEOUS

## 2022-06-17 MED ORDER — IPRATROPIUM-ALBUTEROL 0.5-2.5 (3) MG/3ML IN SOLN: 3.0000 mL | Freq: Once | RESPIRATORY_TRACT | Status: DC

## 2022-06-17 MED ORDER — ALBUTEROL SULFATE (2.5 MG/3ML) 0.083% IN NEBU
2.5000 mg | INHALATION_SOLUTION | RESPIRATORY_TRACT | Status: DC | PRN
  Administered 2022-06-18: 2.5 mg via RESPIRATORY_TRACT
  Filled 2022-06-17: qty 3

## 2022-06-17 MED ORDER — UMECLIDINIUM BROMIDE 62.5 MCG/ACT IN AEPB
1.0000 | INHALATION_SPRAY | Freq: Every day | RESPIRATORY_TRACT | Status: DC
  Filled 2022-06-17: qty 7

## 2022-06-17 MED ORDER — IPRATROPIUM-ALBUTEROL 0.5-2.5 (3) MG/3ML IN SOLN
9.0000 mL | Freq: Once | RESPIRATORY_TRACT | Status: AC
  Administered 2022-06-17: 9 mL via RESPIRATORY_TRACT
  Filled 2022-06-17: qty 9

## 2022-06-17 MED ORDER — ALBUTEROL SULFATE (2.5 MG/3ML) 0.083% IN NEBU
10.0000 mg/h | INHALATION_SOLUTION | Freq: Once | RESPIRATORY_TRACT | Status: AC
  Administered 2022-06-17: 10 mg/h via RESPIRATORY_TRACT
  Filled 2022-06-17: qty 0.5
  Filled 2022-06-17: qty 12

## 2022-06-17 MED ORDER — SODIUM CHLORIDE 0.9 % IV SOLN
1.0000 g | INTRAVENOUS | Status: DC
  Administered 2022-06-17 – 2022-06-19 (×3): 1 g via INTRAVENOUS
  Filled 2022-06-17 (×3): qty 10

## 2022-06-17 MED ORDER — MOMETASONE FURO-FORMOTEROL FUM 200-5 MCG/ACT IN AERO
2.0000 | INHALATION_SPRAY | Freq: Two times a day (BID) | RESPIRATORY_TRACT | Status: DC
  Administered 2022-06-18 – 2022-06-20 (×4): 2 via RESPIRATORY_TRACT
  Filled 2022-06-17 (×2): qty 8.8

## 2022-06-17 MED ORDER — PREDNISONE 20 MG PO TABS
40.0000 mg | ORAL_TABLET | Freq: Every day | ORAL | Status: DC
  Administered 2022-06-18: 40 mg via ORAL
  Filled 2022-06-17: qty 2

## 2022-06-17 NOTE — ED Notes (Signed)
Pt removed self from Bi-PAP and ambulated to bathroom. This RN had attempted to educate pt on importance of keeping Bi-PAP on, pt was upset. MD Kommor made aware, okay to remove Bi-PAP.

## 2022-06-17 NOTE — Assessment & Plan Note (Addendum)
Requiring rescue BIPAP. Patient has acute respiratory failure with hypoxia due to having a new oxygen requirement.  That is the patient has a PaO2 < 60 (pulse Ox < 90%) on room air. Patient has chronic respiratory failure with hypoxia.  That is they require home oxygen at baseline. 1. BIPAP 2. COPD exacerbation treatment as below.

## 2022-06-17 NOTE — ED Provider Notes (Signed)
[facility] EMERGENCY DEPARTMENT Provider Note   CSN: [id number] Arrival date & time: 06/17/22  1805     History {Add pertinent medical, surgical, social history, OB history to HPI:1} No chief complaint on file.   Brittany [name] is a 61 y.o. female.  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     Home Medications Prior to Admission medications   Medication Sig Start Date End Date Taking? Authorizing Provider  albuterol (VENTOLIN HFA) 108 (90 Base) MCG/ACT inhaler Inhale 2 puffs into the lungs every 6 (six) hours as needed for wheezing or shortness of breath. 03/21/22   Loni Beckwith, PA-C  apixaban (ELIQUIS) 5 MG TABS tablet Take 1 tablet (5 mg total) by mouth 2 (two) times daily. 03/20/22   Bensimhon, Shaune Pascal, MD  atorvastatin (LIPITOR) 80 MG tablet Take 1 tablet (80 mg total) by mouth daily at 6 PM. 04/16/22   Hongalgi, Lenis Dickinson, MD  blood glucose meter kit and supplies KIT Dispense based on patient and insurance preference. Use up to four times daily as directed. 04/11/21   Bonnielee Haff, MD  FARXIGA 10 MG TABS tablet TAKE 1 TABLET (10 MG TOTAL) BY MOUTH DAILY BEFORE BREAKFAST. 08/16/21   Bensimhon, Shaune Pascal, MD  Glucerna (GLUCERNA) LIQD Take 237 mLs by mouth See admin instructions. Takes 1 bottle twice a week    [provider]  ipratropium-albuterol (DUONEB) 0.5-2.5 (3) MG/3ML SOLN Take 3 mLs by nebulization every 4 (four) hours as needed (for shortness of breath). 04/16/22   Hongalgi, Lenis Dickinson, MD  ivabradine (CORLANOR) 7.5 MG TABS tablet TAKE 1 TABLET (7.5 MG TOTAL) BY MOUTH 2 (TWO) TIMES DAILY WITH A MEAL. 06/10/22   Reino Bellis B, NP  metolazone  (ZAROXOLYN) 2.5 MG tablet Take 2.5 mg by mouth daily as needed (edema).    [provider]  Multiple Vitamin (MULTIVITAMIN WITH MINERALS) TABS tablet Take 1 tablet by mouth daily. 12/15/21   Hongalgi, Lenis Dickinson, MD  naphazoline-pheniramine (VISINE) 0.025-0.3 % ophthalmic solution Place 1 drop into both eyes 4 (four) times daily as needed for eye irritation.    [provider]  nitroGLYCERIN (NITROSTAT) 0.4 MG SL tablet Place 1 tablet (0.4 mg total) under the tongue every 5 (five) minutes as needed for chest pain. 02/14/19   Shirley Friar, PA-C  OVER THE COUNTER MEDICATION Place 1 spray into both nostrils daily as needed (for congestion).    [provider]  potassium chloride SA (KLOR-CON M) 20 MEQ tablet Take 2 tablets (40 mEq total) by mouth daily. 05/19/22   Rafael Bihari, FNP  spironolactone (ALDACTONE) 25 MG tablet Take 1 tablet (25 mg total) by mouth daily. 05/19/22   Rafael Bihari, FNP  torsemide (DEMADEX) 20 MG tablet TAKE 3 TABLETS BY MOUTH EVERY DAY 06/12/22   Bensimhon, Shaune Pascal, MD      Allergies    Codeine    Review of Systems   Review of Systems  Physical Exam Updated Vital Signs BP 122/84 (BP Location: Right Arm)   Pulse 92   Temp 99 F (37.2 C) (Oral)   Resp (!) 32   SpO2 99%  Physical Exam  ED Results / Procedures / Treatments   Labs (  all labs ordered are listed, but only abnormal results are displayed) Labs Reviewed - No data to display  EKG None  Radiology No results found.  Procedures Procedures  {Document cardiac monitor, telemetry assessment procedure when appropriate:1}  Medications Ordered in ED Medications - No data to display  ED Course/ Medical Decision Making/ A&P                           Medical Decision Making  ***  {Document critical care time when appropriate:1} {Document review of labs and clinical decision tools ie heart score, Chads2Vasc2 etc:1}  {Document your independent review of radiology  images, and any outside records:1} {Document your discussion with family members, caretakers, and with consultants:1} {Document social determinants of health affecting pt's care:1} {Document your decision making why or why not admission, treatments were needed:1} Final Clinical Impression(s) / ED Diagnoses Final diagnoses:  None    Rx / DC Orders ED Discharge Orders     None

## 2022-06-17 NOTE — Assessment & Plan Note (Addendum)
Apparently in sustained remission following chemo + radiation in 2016. Patient seems to have lucked out.  6-7 years after treatment,  Last saw Dr. Julien Nordmann in Feb 2021 who wanted to see her back in 1 yr for restaging, but lost to follow up. Now 2 years overdue for onc follow up. 1. Probably should follow up with Dr. Julien Nordmann at some point after hospital stay. 2. Looks like he was just doing CT C/A/P annually for surveillance as of 2021.  So presumably shes okay at least until Oct from that perspective since no evidence of dz recurrence on CTs of Chest and A/P as recently as Oct and Dec 2022 respectively.

## 2022-06-17 NOTE — Assessment & Plan Note (Signed)
CAD s/p CABG Chronic and stable. Trop 40 today: looks like baseline trop 20s-50s chronically.

## 2022-06-17 NOTE — Progress Notes (Signed)
Called to help ED RT, MD at beside and bipap was ordered d/t SOB and increased WOB.  Pt appears to tol well currently, pt denies nausea, pt states bipap seems to be helping currently.  SAt 100% currently.

## 2022-06-17 NOTE — ED Notes (Signed)
Pt finished with neb treatment, provided pt with a sandwich bag and something to drink.

## 2022-06-17 NOTE — H&P (Signed)
History and Physical    Patient: Brittany [name] LNL:[id number] DOB: [birth year] DOA: 06/17/2022 DOS: the patient was seen and examined on 06/17/2022 PCP: Traci Sermon, PA-C  Patient coming from: Home  Chief Complaint:  Chief Complaint  Patient presents with   Shortness of Breath   HPI: Brittany [name] is a 61 y.o. female with medical history significant of PAF on eliquis, COPD, HTN, small cell lung CA in remission, CAD s/p CABG, HFrEF (20-25%) s/p ICD and Barostim.  Last admitted to our service in April this year for Acute hypoxic resp failure secondary to both acute on chronic CHF as well as COPD exacerbation.  Pt presents to ED with SOB / respiratory distress.  Symptoms onset 2 days ago.  Worsening.  Persistent.  Now severe in ED.  Neb treatments at home not providing relief.  No increased edema from baseline.  No fevers, chills.  Review of Systems: As mentioned in the history of present illness. All other systems reviewed and are negative. Past Medical History:  Diagnosis Date   Acute respiratory failure ([place]) 40/81/4481   Acute systolic congestive heart failure ([place]) 02/03/2018   AICD (automatic cardioverter/defibrillator) present    Allergy    Anxiety    Asthma    DM2 (diabetes mellitus, type 2) ([place]) 10/20/2020   Hypertension    PAF (paroxysmal atrial fibrillation) ([place])    Presence of permanent cardiac pacemaker    Prophylactic measure 08/03/14-08/19/14   Prophyl. 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   LIMA to LAD, SVG to D1, SVG to OM1, EVH via right thigh with implantation of Impella LD LVAD via direct aortic approach   Small cell lung cancer ([place]) 03/16/2014   Past Surgical History:  Procedure Laterality Date   BRONCHIAL BRUSHINGS  10/25/2020   Procedure: BRONCHIAL BRUSHINGS;  Surgeon: Collene Gobble, MD;  Location: [facility] ENDOSCOPY;  Service: Pulmonary;;   BRONCHIAL NEEDLE ASPIRATION BIOPSY  10/25/2020   Procedure:  BRONCHIAL NEEDLE ASPIRATION BIOPSIES;  Surgeon: Collene Gobble, MD;  Location: [facility] ENDOSCOPY;  Service: Pulmonary;;   CARDIAC DEFIBRILLATOR PLACEMENT  08/15/2018   MDT Visia AF MRI VR ICD implanted by Dr Loel Lofty for primary prevention of sudden   CESAREAN SECTION     CORONARY ARTERY BYPASS GRAFT N/A 02/03/2018   Procedure: CORONARY ARTERY BYPASS GRAFTING (CABG);  Surgeon: Rexene Alberts, MD;  Location: [facility];  Service: Open Heart Surgery;  Laterality: N/A;  Time 3 using left internal mammary artery and endoscopically harvested right saphenous vein   CORONARY BALLOON ANGIOPLASTY N/A 02/03/2018   Procedure: CORONARY BALLOON ANGIOPLASTY;  Surgeon: Martinique, Peter M, MD;  Location: [facility] CV LAB;  Service: Cardiovascular;  Laterality: N/A;   CORONARY STENT INTERVENTION N/A 02/12/2019   Procedure: CORONARY STENT INTERVENTION;  Surgeon: Wellington Hampshire, MD;  Location: [facility] CV LAB;  Service: Cardiovascular;  Laterality: N/A;   CORONARY/GRAFT ACUTE MI REVASCULARIZATION N/A 02/03/2018   Procedure: Coronary/Graft Acute MI Revascularization;  Surgeon: Martinique, Peter M, MD;  Location: [facility] CV LAB;  Service: Cardiovascular;  Laterality: N/A;   ENDOBRONCHIAL ULTRASOUND N/A 10/25/2020   Procedure: ENDOBRONCHIAL ULTRASOUND;  Surgeon: Collene Gobble, MD;  Location: [facility] ENDOSCOPY;  Service: Pulmonary;  Laterality: N/A;   FLEXIBLE BRONCHOSCOPY  10/25/2020   Procedure: FLEXIBLE BRONCHOSCOPY;  Surgeon: Collene Gobble, MD;  Location: [facility] ENDOSCOPY;  Service: Pulmonary;;   IABP INSERTION N/A 02/03/2018   Procedure: IABP Insertion;  Surgeon: Martinique, Peter M, MD;  Location:  [facility] INVASIVE CV LAB;  Service: Cardiovascular;  Laterality: N/A;   INTRAOPERATIVE TRANSESOPHAGEAL ECHOCARDIOGRAM N/A 02/03/2018   Procedure: INTRAOPERATIVE TRANSESOPHAGEAL ECHOCARDIOGRAM;  Surgeon: Rexene Alberts, MD;  Location: [facility];  Service: Open Heart Surgery;  Laterality: N/A;   LEFT HEART CATH AND CORONARY ANGIOGRAPHY N/A  02/03/2018   Procedure: LEFT HEART CATH AND CORONARY ANGIOGRAPHY;  Surgeon: Martinique, Peter M, MD;  Location: [facility] CV LAB;  Service: Cardiovascular;  Laterality: N/A;   LEFT HEART CATH AND CORS/GRAFTS ANGIOGRAPHY N/A 02/12/2019   Procedure: LEFT HEART CATH AND CORS/GRAFTS ANGIOGRAPHY;  Surgeon: Wellington Hampshire, MD;  Location: [facility] CV LAB;  Service: Cardiovascular;  Laterality: N/A;   MEDIASTINOSCOPY N/A 03/11/2014   Procedure: MEDIASTINOSCOPY;  Surgeon: Melrose Nakayama, MD;  Location: [facility];  Service: Thoracic;  Laterality: N/A;   PLACEMENT OF IMPELLA LEFT VENTRICULAR ASSIST DEVICE  02/03/2018   Procedure: PLACEMENT OF [place] LEFT VENTRICULAR ASSIST DEVICE LD;  Surgeon: Rexene Alberts, MD;  Location: [facility];  Service: Open Heart Surgery;;   REMOVAL OF [place] LEFT VENTRICULAR ASSIST DEVICE N/A 02/08/2018   Procedure: REMOVAL OF [place] LEFT VENTRICULAR ASSIST DEVICE;  Surgeon: Rexene Alberts, MD;  Location: [facility];  Service: Open Heart Surgery;  Laterality: N/A;   RIGHT HEART CATH N/A 02/03/2018   Procedure: RIGHT HEART CATH;  Surgeon: Martinique, Peter M, MD;  Location: [facility] CV LAB;  Service: Cardiovascular;  Laterality: N/A;   RIGHT HEART CATH N/A 05/09/2018   Procedure: RIGHT HEART CATH;  Surgeon: Jolaine Artist, MD;  Location: [facility] CV LAB;  Service: Cardiovascular;  Laterality: N/A;   TEE WITHOUT CARDIOVERSION N/A 02/08/2018   Procedure: TRANSESOPHAGEAL ECHOCARDIOGRAM (TEE);  Surgeon: Rexene Alberts, MD;  Location: [facility];  Service: Open Heart Surgery;  Laterality: N/A;   TUBAL LIGATION     VIDEO BRONCHOSCOPY WITH ENDOBRONCHIAL ULTRASOUND N/A 03/11/2014   Procedure: VIDEO BRONCHOSCOPY WITH ENDOBRONCHIAL ULTRASOUND;  Surgeon: Melrose Nakayama, MD;  Location: [facility];  Service: Thoracic;  Laterality: N/A;   Social History:  reports that she quit smoking about 8 years ago. Her smoking use included cigarettes. She has a 20.00 pack-year smoking history. She has never used  smokeless tobacco. She reports current alcohol use of about 8.0 standard drinks of alcohol per week. She reports current drug use. Drug: Marijuana.  Allergies  Allergen Reactions   Codeine Nausea And Vomiting    Family History  Problem Relation Age of Onset   Heart disease Mother    Hypertension Mother    Heart attack Mother    Hypertension Maternal Grandmother    Cancer Maternal Grandmother    Diabetes Paternal Grandmother    Stroke Neg Hx     Prior to Admission medications   Medication Sig Start Date End Date Taking? Authorizing Provider  albuterol (VENTOLIN HFA) 108 (90 Base) MCG/ACT inhaler Inhale 2 puffs into the lungs every 6 (six) hours as needed for wheezing or shortness of breath. 03/21/22   Loni Beckwith, PA-C  apixaban (ELIQUIS) 5 MG TABS tablet Take 1 tablet (5 mg total) by mouth 2 (two) times daily. 03/20/22   Bensimhon, Shaune Pascal, MD  atorvastatin (LIPITOR) 80 MG tablet Take 1 tablet (80 mg total) by mouth daily at 6 PM. 04/16/22   Hongalgi, Lenis Dickinson, MD  blood glucose meter kit and supplies KIT Dispense based on patient and insurance preference. Use up to four times daily as directed. 04/11/21   Bonnielee Haff, MD  [place]  10 MG TABS tablet TAKE 1 TABLET (10 MG TOTAL) BY MOUTH DAILY BEFORE BREAKFAST. 08/16/21   Bensimhon, Shaune Pascal, MD  Glucerna (GLUCERNA) LIQD Take 237 mLs by mouth See admin instructions. Takes 1 bottle twice a week    [provider]  ipratropium-albuterol (DUONEB) 0.5-2.5 (3) MG/3ML SOLN Take 3 mLs by nebulization every 4 (four) hours as needed (for shortness of breath). 04/16/22   Hongalgi, Lenis Dickinson, MD  ivabradine (CORLANOR) 7.5 MG TABS tablet TAKE 1 TABLET (7.5 MG TOTAL) BY MOUTH 2 (TWO) TIMES DAILY WITH A MEAL. 06/10/22   Reino Bellis B, NP  metolazone (ZAROXOLYN) 2.5 MG tablet Take 2.5 mg by mouth daily as needed (edema).    [provider]  Multiple Vitamin (MULTIVITAMIN WITH MINERALS) TABS tablet Take 1 tablet by mouth daily. 12/15/21    Hongalgi, Lenis Dickinson, MD  naphazoline-pheniramine (VISINE) 0.025-0.3 % ophthalmic solution Place 1 drop into both eyes 4 (four) times daily as needed for eye irritation.    [provider]  nitroGLYCERIN (NITROSTAT) 0.4 MG SL tablet Place 1 tablet (0.4 mg total) under the tongue every 5 (five) minutes as needed for chest pain. 02/14/19   Shirley Friar, PA-C  OVER THE COUNTER MEDICATION Place 1 spray into both nostrils daily as needed (for congestion).    [provider]  potassium chloride SA (KLOR-CON M) 20 MEQ tablet Take 2 tablets (40 mEq total) by mouth daily. 05/19/22   Rafael Bihari, FNP  spironolactone (ALDACTONE) 25 MG tablet Take 1 tablet (25 mg total) by mouth daily. 05/19/22   Rafael Bihari, FNP  torsemide (DEMADEX) 20 MG tablet TAKE 3 TABLETS BY MOUTH EVERY DAY 06/12/22   Bensimhon, Shaune Pascal, MD    Physical Exam: Vitals:   06/17/22 1914 06/17/22 1930 06/17/22 1945 06/17/22 2030  BP:  117/69 118/64 108/64  Pulse: 84 87 85 84  Resp: (!) 31 16 (!) 24 (!) 22  Temp:      TempSrc:      SpO2: 100% 100% 100% 100%   Constitutional: Mildly increased WOB Eyes: PERRL, lids and conjunctivae normal ENMT: Mucous membranes are moist. Posterior pharynx clear of any exudate or lesions.Normal dentition.  Neck: normal, supple, no masses, no thyromegaly Respiratory: Diffuse wheezing Cardiovascular: Regular rate and rhythm, no murmurs / rubs / gallops. No extremity edema. 2+ pedal pulses. No carotid bruits.  Abdomen: no tenderness, no masses palpated. No hepatosplenomegaly. Bowel sounds positive.  Musculoskeletal: no clubbing / cyanosis. No joint deformity upper and lower extremities. Good ROM, no contractures. Normal muscle tone.  Skin: no rashes, lesions, ulcers. No induration Neurologic: CN 2-12 grossly intact. Sensation intact, DTR normal. Strength 5/5 in all 4.  Psychiatric: Normal judgment and insight. Alert and oriented x 3. Normal mood.   Data Reviewed:        Latest Ref Rng & Units 06/17/2022    7:47 PM 06/17/2022    7:22 PM 04/25/2022   12:44 PM  CBC  WBC 4.0 - 10.5 K/uL  4.9  8.4   Hemoglobin 12.0 - 15.0 g/dL 17.7  15.9  12.2   Hematocrit 36.0 - 46.0 % 52.0  50.3  39.5   Platelets 150 - 400 K/uL  153  186       Latest Ref Rng & Units 06/17/2022    7:47 PM 06/17/2022    7:22 PM 05/24/2022   12:04 PM  CMP  Glucose 70 - 99 mg/dL  102  132   BUN 8 -  23 mg/dL  26  18   Creatinine 0.44 - 1.00 mg/dL  1.55  1.15   Sodium 135 - 145 mmol/L 137  138  138   Potassium 3.5 - 5.1 mmol/L 3.6  3.6  4.3   Chloride 98 - 111 mmol/L  97  106   CO2 22 - 32 mmol/L  24  22   Calcium 8.9 - 10.3 mg/dL  9.7  9.5   Total Protein 6.5 - 8.1 g/dL  8.1    Total Bilirubin 0.3 - 1.2 mg/dL  1.1    Alkaline Phos 38 - 126 U/L  83    AST 15 - 41 U/L  30    ALT 0 - 44 U/L  18     CXR = No acute findings  VBG = pH 7.4, PCO2 46.9, PO2 126 (are we sure this is venous?), with an O2 sat of 99%, bicarb 29.  Not sure what FIO2 is or if this is on BIPAP.  Assessment and Plan: * COPD exacerbation ([place]) Pt with acute COPD exacerbation / acute bronchitis. COPD pathway Wean BIPAP as able Tele monitor and cont pulse ox Scheduled nebs: LABA/LAMA and INH steroid PRN SABA  Got solumedrol x1 dose in ED Will order PO prednisone Empiric rocephin H/o pseudomonas in urine in 2019, but I dont see anything to indicate pulmonary coverage for pseudomonas needed at this point.  Acute on chronic respiratory failure with hypoxia (HCC) Requiring rescue BIPAP. Patient has acute respiratory failure with hypoxia due to having a new oxygen requirement.  That is the patient has a PaO2 < 60 (pulse Ox < 90%) on room air. Patient has chronic respiratory failure with hypoxia.  That is they require home oxygen at baseline. BIPAP COPD exacerbation treatment as below.  AF (paroxysmal atrial fibrillation) (HCC) Cont eliquis  Chronic HFrEF (heart failure with reduced ejection fraction) ([place]) In  contrast to April admit: does not appear to be in decompensated (acute) CHF today: BNP today of 800 is actually one of the lowest values shes had on record.  Typically has BNPs in the 2000s when admitted for CHF. No evidence of pulm edema nor pleural effusion on todays X ray (had effusion on X ray last admit that resolved after diuresis) No peripheral edema.  Pt is status post AICD and Barostim devices. Pt is followed by cards. Cont all home cardiac meds at this point including: Aldactone demadex iverbradine  Type 2 diabetes mellitus with stage 3a chronic kidney disease, without long-term current use of insulin (HCC) CKD 3a appears chronic Creat seems to range between 1.1 and 1.5 and highly variable (suspect this may be related to if she took PRN zaroxolyn recently) Check BMP repeat in AM Regarding DM2: Well controlled with just farxiga and diet: A1C = 5.9 in April. Continue farxiga Add sensitive SSI Q4H (pt going on steroids), may need to increase  S/P CABG (coronary artery bypass graft) CAD s/p CABG Chronic and stable. Trop 40 today: looks like baseline trop 20s-50s chronically.  Small cell lung cancer ([place]) Apparently in sustained remission following chemo + radiation in 2016. Patient seems to have lucked out.  6-7 years after treatment,  Last saw Dr. Julien Nordmann in Feb 2021 who wanted to see her back in 1 yr for restaging, but lost to follow up. Now 2 years overdue for onc follow up. Probably should follow up with Dr. Julien Nordmann at some point after hospital stay. Looks like he was just doing CT C/A/P annually for surveillance  as of 2021.  So presumably shes okay at least until Oct from that perspective since no evidence of dz recurrence on CTs of Chest and A/P as recently as Oct and Dec 2022 respectively.      Advance Care Planning:   Code Status: Full Code  Consults: None  Family Communication: No family in room  Severity of Illness: The appropriate patient status for this  patient is OBSERVATION. Observation status is judged to be reasonable and necessary in order to provide the required intensity of service to ensure the patient's safety. The patient's presenting symptoms, physical exam findings, and initial radiographic and laboratory data in the context of their medical condition is felt to place them at decreased risk for further clinical deterioration. Furthermore, it is anticipated that the patient will be medically stable for discharge from the hospital within 2 midnights of admission.   Author: Etta Quill., DO 06/17/2022 10:15 PM  For on call review [email or web address].

## 2022-06-17 NOTE — Assessment & Plan Note (Signed)
Cont eliquis

## 2022-06-17 NOTE — ED Notes (Signed)
Respiratory at bedside to place pt on bipap

## 2022-06-17 NOTE — ED Notes (Signed)
Pt was placed on [place] and changed pt gown due to blood being on it. Gave pt socks per her request and some water. Placed pt on pur-wick due to being SOB when getting up and also being on a hr long neb

## 2022-06-17 NOTE — Assessment & Plan Note (Signed)
In contrast to April admit: does not appear to be in decompensated (acute) CHF today: 1. BNP today of 800 is actually one of the lowest values shes had on record.  Typically has BNPs in the 2000s when admitted for CHF. 2. No evidence of pulm edema nor pleural effusion on todays X ray (had effusion on X ray last admit that resolved after diuresis) 3. No peripheral edema.  Pt is status post AICD and Barostim devices. Pt is followed by cards. 1. Cont all home cardiac meds at this point including: 1. Aldactone 2. demadex 3. iverbradine

## 2022-06-17 NOTE — Assessment & Plan Note (Addendum)
CKD 3a appears chronic Creat seems to range between 1.1 and 1.5 and highly variable (suspect this may be related to if she took PRN zaroxolyn recently) 1. Check BMP repeat in AM Regarding DM2: Well controlled with just farxiga and diet: A1C = 5.9 in April. 1. Continue farxiga 2. Add sensitive SSI Q4H (pt going on steroids), may need to increase

## 2022-06-17 NOTE — ED Triage Notes (Signed)
Patient complains of 2 days of increased sob. Patient has copd and chf and using nebs with no relief. Patient states that she has had to be on Bipap for same

## 2022-06-17 NOTE — Assessment & Plan Note (Addendum)
Pt with acute COPD exacerbation / acute bronchitis. 1. COPD pathway 2. Wean BIPAP as able 3. Tele monitor and cont pulse ox 4. Scheduled nebs: LABA/LAMA and INH steroid 5. PRN SABA  6. Got solumedrol x1 dose in ED 1. Will order PO prednisone 7. Empiric rocephin 1. H/o pseudomonas in urine in 2019, but I dont see anything to indicate pulmonary coverage for pseudomonas needed at this point. 8. COVID test pending

## 2022-06-18 DIAGNOSIS — E1122 Type 2 diabetes mellitus with diabetic chronic kidney disease: Secondary | ICD-10-CM | POA: Diagnosis present

## 2022-06-18 DIAGNOSIS — N1831 Chronic kidney disease, stage 3a: Secondary | ICD-10-CM | POA: Diagnosis present

## 2022-06-18 DIAGNOSIS — I5022 Chronic systolic (congestive) heart failure: Secondary | ICD-10-CM | POA: Diagnosis present

## 2022-06-18 DIAGNOSIS — Z681 Body mass index (BMI) 19 or less, adult: Secondary | ICD-10-CM | POA: Diagnosis not present

## 2022-06-18 DIAGNOSIS — E440 Moderate protein-calorie malnutrition: Secondary | ICD-10-CM | POA: Diagnosis present

## 2022-06-18 DIAGNOSIS — Z8249 Family history of ischemic heart disease and other diseases of the circulatory system: Secondary | ICD-10-CM | POA: Diagnosis not present

## 2022-06-18 DIAGNOSIS — Z809 Family history of malignant neoplasm, unspecified: Secondary | ICD-10-CM | POA: Diagnosis not present

## 2022-06-18 DIAGNOSIS — Z9221 Personal history of antineoplastic chemotherapy: Secondary | ICD-10-CM | POA: Diagnosis not present

## 2022-06-18 DIAGNOSIS — R0602 Shortness of breath: Secondary | ICD-10-CM | POA: Diagnosis present

## 2022-06-18 DIAGNOSIS — Z951 Presence of aortocoronary bypass graft: Secondary | ICD-10-CM | POA: Diagnosis not present

## 2022-06-18 DIAGNOSIS — I480 Paroxysmal atrial fibrillation: Secondary | ICD-10-CM | POA: Diagnosis present

## 2022-06-18 DIAGNOSIS — J440 Chronic obstructive pulmonary disease with acute lower respiratory infection: Secondary | ICD-10-CM | POA: Diagnosis present

## 2022-06-18 DIAGNOSIS — Z85118 Personal history of other malignant neoplasm of bronchus and lung: Secondary | ICD-10-CM | POA: Diagnosis not present

## 2022-06-18 DIAGNOSIS — Z20822 Contact with and (suspected) exposure to covid-19: Secondary | ICD-10-CM | POA: Diagnosis present

## 2022-06-18 DIAGNOSIS — I2510 Atherosclerotic heart disease of native coronary artery without angina pectoris: Secondary | ICD-10-CM | POA: Diagnosis present

## 2022-06-18 DIAGNOSIS — Z833 Family history of diabetes mellitus: Secondary | ICD-10-CM | POA: Diagnosis not present

## 2022-06-18 DIAGNOSIS — I130 Hypertensive heart and chronic kidney disease with heart failure and stage 1 through stage 4 chronic kidney disease, or unspecified chronic kidney disease: Secondary | ICD-10-CM | POA: Diagnosis present

## 2022-06-18 DIAGNOSIS — Z87891 Personal history of nicotine dependence: Secondary | ICD-10-CM | POA: Diagnosis not present

## 2022-06-18 DIAGNOSIS — Z955 Presence of coronary angioplasty implant and graft: Secondary | ICD-10-CM | POA: Diagnosis not present

## 2022-06-18 DIAGNOSIS — Z95810 Presence of automatic (implantable) cardiac defibrillator: Secondary | ICD-10-CM | POA: Diagnosis not present

## 2022-06-18 DIAGNOSIS — Z885 Allergy status to narcotic agent status: Secondary | ICD-10-CM | POA: Diagnosis not present

## 2022-06-18 DIAGNOSIS — J441 Chronic obstructive pulmonary disease with (acute) exacerbation: Secondary | ICD-10-CM | POA: Diagnosis present

## 2022-06-18 DIAGNOSIS — E876 Hypokalemia: Secondary | ICD-10-CM | POA: Diagnosis present

## 2022-06-18 DIAGNOSIS — J9621 Acute and chronic respiratory failure with hypoxia: Secondary | ICD-10-CM | POA: Diagnosis present

## 2022-06-18 DIAGNOSIS — Z923 Personal history of irradiation: Secondary | ICD-10-CM | POA: Diagnosis not present

## 2022-06-18 LAB — COMPREHENSIVE METABOLIC PANEL
ALT: 17 U/L (ref 0–44)
AST: 25 U/L (ref 15–41)
Albumin: 3.9 g/dL (ref 3.5–5.0)
Alkaline Phosphatase: 74 U/L (ref 38–126)
Anion gap: 14 (ref 5–15)
BUN: 33 mg/dL — ABNORMAL HIGH (ref 8–23)
CO2: 23 mmol/L (ref 22–32)
Calcium: 9.1 mg/dL (ref 8.9–10.3)
Chloride: 98 mmol/L (ref 98–111)
Creatinine, Ser: 1.62 mg/dL — ABNORMAL HIGH (ref 0.44–1.00)
GFR, Estimated: 36 mL/min — ABNORMAL LOW (ref >60)
Glucose, Bld: 252 mg/dL — ABNORMAL HIGH (ref 70–99)
Potassium: 3.4 mmol/L — ABNORMAL LOW (ref 3.5–5.1)
Sodium: 135 mmol/L (ref 135–145)
Total Bilirubin: 0.6 mg/dL (ref 0.3–1.2)
Total Protein: 7.4 g/dL (ref 6.5–8.1)

## 2022-06-18 LAB — CBC
HCT: 47.4 % — ABNORMAL HIGH (ref 36.0–46.0)
Hemoglobin: 14.7 g/dL (ref 12.0–15.0)
MCH: 29.7 pg (ref 26.0–34.0)
MCHC: 31 g/dL (ref 30.0–36.0)
MCV: 95.8 fL (ref 80.0–100.0)
Platelets: 154 10*3/uL (ref 150–400)
RBC: 4.95 MIL/uL (ref 3.87–5.11)
RDW: 19.6 % — ABNORMAL HIGH (ref 11.5–15.5)
WBC: 3.2 10*3/uL — ABNORMAL LOW (ref 4.0–10.5)
nRBC: 0 % (ref 0.0–0.2)

## 2022-06-18 LAB — SARS CORONAVIRUS 2 BY RT PCR: SARS Coronavirus 2 by RT PCR: NEGATIVE

## 2022-06-18 LAB — CBG MONITORING, ED
Glucose-Capillary: 252 mg/dL — ABNORMAL HIGH (ref 70–99)
Glucose-Capillary: 123 mg/dL — ABNORMAL HIGH (ref 70–99)
Glucose-Capillary: 233 mg/dL — ABNORMAL HIGH (ref 70–99)

## 2022-06-18 LAB — HIV ANTIBODY (ROUTINE TESTING W REFLEX): HIV Screen 4th Generation wRfx: NONREACTIVE

## 2022-06-18 LAB — GLUCOSE, CAPILLARY
Glucose-Capillary: 199 mg/dL — ABNORMAL HIGH (ref 70–99)
Glucose-Capillary: 167 mg/dL — ABNORMAL HIGH (ref 70–99)

## 2022-06-18 MED ORDER — GUAIFENESIN-DM 100-10 MG/5ML PO SYRP
5.0000 mL | ORAL_SOLUTION | ORAL | Status: DC | PRN
Start: 2022-06-18 — End: 2022-06-20
  Administered 2022-06-18 – 2022-06-19 (×4): 5 mL via ORAL
  Filled 2022-06-18 (×4): qty 5

## 2022-06-18 MED ORDER — IPRATROPIUM-ALBUTEROL 0.5-2.5 (3) MG/3ML IN SOLN
3.0000 mL | Freq: Three times a day (TID) | RESPIRATORY_TRACT | Status: DC
  Administered 2022-06-18 – 2022-06-20 (×5): 3 mL via RESPIRATORY_TRACT
  Filled 2022-06-18 (×5): qty 3

## 2022-06-18 MED ORDER — IVABRADINE HCL 7.5 MG PO TABS
7.5000 mg | ORAL_TABLET | Freq: Two times a day (BID) | ORAL | Status: DC
  Administered 2022-06-18 – 2022-06-20 (×4): 7.5 mg via ORAL
  Filled 2022-06-18 (×5): qty 1

## 2022-06-18 MED ORDER — TORSEMIDE 20 MG PO TABS
40.0000 mg | ORAL_TABLET | Freq: Every day | ORAL | Status: DC
  Administered 2022-06-18 – 2022-06-20 (×3): 40 mg via ORAL
  Filled 2022-06-18 (×3): qty 2

## 2022-06-18 MED ORDER — ENOXAPARIN SODIUM 40 MG/0.4ML IJ SOSY
40.0000 mg | PREFILLED_SYRINGE | INTRAMUSCULAR | Status: DC
  Administered 2022-06-18: 40 mg via SUBCUTANEOUS
  Filled 2022-06-18: qty 0.4

## 2022-06-18 MED ORDER — METHYLPREDNISOLONE SODIUM SUCC 125 MG IJ SOLR
60.0000 mg | Freq: Two times a day (BID) | INTRAMUSCULAR | Status: DC
Start: 2022-06-18 — End: 2022-06-19
  Administered 2022-06-18 (×2): 60 mg via INTRAVENOUS
  Filled 2022-06-18 (×2): qty 2

## 2022-06-18 MED ORDER — POTASSIUM CHLORIDE CRYS ER 20 MEQ PO TBCR
40.0000 meq | EXTENDED_RELEASE_TABLET | Freq: Two times a day (BID) | ORAL | Status: DC
  Administered 2022-06-18 (×2): 40 meq via ORAL
  Filled 2022-06-18 (×3): qty 2

## 2022-06-18 MED ORDER — APIXABAN 2.5 MG PO TABS
2.5000 mg | ORAL_TABLET | Freq: Two times a day (BID) | ORAL | Status: DC
  Administered 2022-06-18 – 2022-06-20 (×4): 2.5 mg via ORAL
  Filled 2022-06-18 (×4): qty 1

## 2022-06-18 MED ORDER — INSULIN ASPART 100 UNIT/ML IJ SOLN
0.0000 [IU] | Freq: Three times a day (TID) | INTRAMUSCULAR | Status: DC
  Administered 2022-06-19 (×3): 2 [IU] via SUBCUTANEOUS
  Administered 2022-06-20: 3 [IU] via SUBCUTANEOUS

## 2022-06-18 MED ORDER — DAPAGLIFLOZIN PROPANEDIOL 10 MG PO TABS
10.0000 mg | ORAL_TABLET | Freq: Every day | ORAL | Status: DC
  Administered 2022-06-18 – 2022-06-20 (×3): 10 mg via ORAL
  Filled 2022-06-18 (×3): qty 1

## 2022-06-18 MED ORDER — IPRATROPIUM-ALBUTEROL 0.5-2.5 (3) MG/3ML IN SOLN
3.0000 mL | Freq: Four times a day (QID) | RESPIRATORY_TRACT | Status: DC
Start: 2022-06-18 — End: 2022-06-18
  Administered 2022-06-18: 3 mL via RESPIRATORY_TRACT
  Filled 2022-06-18 (×2): qty 3

## 2022-06-18 MED ORDER — SPIRONOLACTONE 12.5 MG HALF TABLET
12.5000 mg | ORAL_TABLET | Freq: Every day | ORAL | Status: DC
  Administered 2022-06-18 – 2022-06-20 (×3): 12.5 mg via ORAL
  Filled 2022-06-18 (×4): qty 1

## 2022-06-18 MED ORDER — ENOXAPARIN SODIUM 300 MG/3ML IJ SOLN: 20.0000 mg | Freq: Every day | INTRAMUSCULAR | Status: DC

## 2022-06-18 MED ORDER — POTASSIUM CHLORIDE CRYS ER 20 MEQ PO TBCR
40.0000 meq | EXTENDED_RELEASE_TABLET | Freq: Once | ORAL | Status: DC
Start: 2022-06-18 — End: 2022-06-18

## 2022-06-18 NOTE — Progress Notes (Signed)
PROGRESS NOTE    Brittany [name]  XLK:[id number] DOB: December 18, 1960 DOA: 06/17/2022 PCP: Traci Sermon, PA-C  60/F with history of paroxysmal A-fib on Eliquis, COPD, hypertension, small cell lung cancer in remission, chronic systolic CHF with EF 36-64%, CAD/CABG presented to the ED with shortness of breath, cough and wheezing.  Denies swelling or orthopnea, compliant with diuretics and low-salt diet In the ED vitals stable, BNP 862, troponin 40, creatinine 1.5, chest x-ray noted cardiomegaly without acute findings  Subjective: Upset about still being in the ED, did not get a diet order yesterday while on BiPAP, breathing improving  Assessment and Plan:  COPD exacerbation ([place]) -Presenting with productive cough wheezing, clinically do not suspect acute CHF exacerbation  -Improving not back to baseline yet, continue IV Solu-Medrol today  -Add DuoNebs  -Wean O2  -COVID PCR is negative, continue empiric Rocephin today   Acute on chronic respiratory failure with hypoxia (HCC) -Required BiPAP briefly on admission last night, now off and stable -Wean O2  AF (paroxysmal atrial fibrillation) (HCC) Cont eliquis  Chronic systolic CHF/ischemic cardiomyopathy -Last echo 1/23 with EF 20-25% which is stable from her prior baselines -Compliant with torsemide, reports that she takes 40 Mg daily and not 60 -Continue Aldactone, Farxiga and Corlanor, reportedly failed Entresto in the past -Pt is status post AICD and Barostim devices. -Followed by advanced heart failure team at baseline -Clinically appears euvolemic, continue current meds  Type 2 diabetes mellitus -Continue Farxiga, A1c was 5.9 in April  CKD 3a -Creatinine stable at baseline, monitor  S/P CABG (coronary artery bypass graft) CAD s/p CABG -Stable  Small cell lung cancer ([place]) Apparently in sustained remission following chemo + radiation in 2016. -Followed by Dr. Julien Nordmann, getting annual surveillance scans  DVT  prophylaxis: Add Lovenox Code Status: Full code Family Communication: Discussed patient detail no family at bedside Disposition Plan: Home in 1 to 2 days  Consultants:    Procedures:   Antimicrobials:    Objective: Vitals:   06/18/22 0730 06/18/22 0745 06/18/22 1045 06/18/22 1100  BP: 112/63 (!) 109/98 (!) 110/49 (!) 104/52  Pulse: 63 (!) 57 82 72  Resp: (!) 25 (!) 24 (!) 21 19  Temp:      TempSrc:      SpO2: 100% 100% 100% 98%   No intake or output data in the 24 hours ending 06/18/22 1144 There were no vitals filed for this visit.  Examination:  General exam: Chronically ill female laying in bed, AAO x3, no distress HEENT: No JVD, surgical scars noted CVS: S1-S2, regular rhythm Lungs: Poor air movement bilaterally, few expiratory wheezes Abdomen: Soft, obese, nontender, bowel sounds present Extremities: No edema  Skin: No rashes Psychiatry:  Mood & affect appropriate.     Data Reviewed:   CBC: Recent Labs  Lab 06/17/22 1922 06/17/22 1947 06/18/22 0446  WBC 4.9  --  3.2*  NEUTROABS 3.0  --   --   HGB 15.9* 17.7* 14.7  HCT 50.3* 52.0* 47.4*  MCV 93.7  --  95.8  PLT 153  --  403   Basic Metabolic Panel: Recent Labs  Lab 06/17/22 1922 06/17/22 1947 06/18/22 0446  NA 138 137 135  K 3.6 3.6 3.4*  CL 97*  --  98  CO2 24  --  23  GLUCOSE 102*  --  252*  BUN 26*  --  33*  CREATININE 1.55*  --  1.62*  CALCIUM 9.7  --  9.1  MG 2.0  --   --  GFR: CrCl cannot be calculated (Unknown ideal weight.). Liver Function Tests: Recent Labs  Lab 06/17/22 1922 06/18/22 0446  AST 30 25  ALT 18 17  ALKPHOS 83 74  BILITOT 1.1 0.6  PROT 8.1 7.4  ALBUMIN 4.5 3.9   No results for input(s): "LIPASE", "AMYLASE" in the last 168 hours. No results for input(s): "AMMONIA" in the last 168 hours. Coagulation Profile: No results for input(s): "INR", "PROTIME" in the last 168 hours. Cardiac Enzymes: No results for input(s): "CKTOTAL", "CKMB", "CKMBINDEX",  "TROPONINI" in the last 168 hours. BNP (last 3 results) Recent Labs    07/19/21 0945  PROBNP 3,162*   HbA1C: No results for input(s): "HGBA1C" in the last 72 hours. CBG: Recent Labs  Lab 06/17/22 2255 06/18/22 0410 06/18/22 0826  GLUCAP 195* 252* 123*   Lipid Profile: No results for input(s): "CHOL", "HDL", "LDLCALC", "TRIG", "CHOLHDL", "LDLDIRECT" in the last 72 hours. Thyroid Function Tests: No results for input(s): "TSH", "T4TOTAL", "FREET4", "T3FREE", "THYROIDAB" in the last 72 hours. Anemia Panel: No results for input(s): "VITAMINB12", "FOLATE", "FERRITIN", "TIBC", "IRON", "RETICCTPCT" in the last 72 hours. Urine analysis:    Component Value Date/Time   COLORURINE STRAW (A) 04/09/2022 1414   APPEARANCEUR CLEAR 04/09/2022 1414   LABSPEC 1.010 04/09/2022 1414   PHURINE 7.0 04/09/2022 1414   GLUCOSEU >=500 (A) 04/09/2022 1414   HGBUR NEGATIVE 04/09/2022 1414   BILIRUBINUR NEGATIVE 04/09/2022 1414   KETONESUR NEGATIVE 04/09/2022 1414   PROTEINUR NEGATIVE 04/09/2022 1414   UROBILINOGEN 1.0 03/15/2015 1117   NITRITE NEGATIVE 04/09/2022 1414   LEUKOCYTESUR TRACE (A) 04/09/2022 1414   Sepsis Labs: @LABRCNTIP (procalcitonin:4,lacticidven:4)  ) Recent Results (from the past 240 hour(s))  SARS Coronavirus 2 by RT PCR (hospital order, performed in [facility] lab) *cepheid single result test* Anterior Nasal Swab     Status: None   Collection Time: 06/17/22  6:39 PM   Specimen: Anterior Nasal Swab  Result Value Ref Range Status   SARS Coronavirus 2 by RT PCR NEGATIVE NEGATIVE Final    Comment: (NOTE) SARS-CoV-2 target nucleic acids are NOT DETECTED.  The SARS-CoV-2 RNA is generally detectable in upper and lower respiratory specimens during the acute phase of infection. The lowest concentration of SARS-CoV-2 viral copies this assay can detect is 250 copies / mL. A negative result does not preclude SARS-CoV-2 infection and should not be used as the sole basis for  treatment or other patient management decisions.  A negative result may occur with improper specimen collection / handling, submission of specimen other than nasopharyngeal swab, presence of viral mutation(s) within the areas targeted by this assay, and inadequate number of viral copies (<250 copies / mL). A negative result must be combined with clinical observations, patient history, and epidemiological information.  Fact Sheet for Patients:   https://www.patel.info/  Fact Sheet for Healthcare Providers: https://hall.com/  This test is not yet approved or  cleared by the [place] FDA and has been authorized for detection and/or diagnosis of SARS-CoV-2 by FDA under an Emergency Use Authorization (EUA).  This EUA will remain in effect (meaning this test can be used) for the duration of the COVID-19 declaration under Section 564(b)(1) of the Act, 21 U.S.C. section 360bbb-3(b)(1), unless the authorization is terminated or revoked sooner.  Performed at [facility], [place] [address], [place], [place] [address]      Radiology Studies: DG Chest Portable 1 View  Result Date: 06/17/2022 CLINICAL DATA:  Shortness of breath EXAM: PORTABLE CHEST 1 VIEW  COMPARISON:  04/11/2022 FINDINGS: Cardiomegaly status post median sternotomy and CABG. Left chest single lead pacer. Both lungs are clear. The visualized skeletal structures are unremarkable. IMPRESSION: Cardiomegaly without acute abnormality of the lungs in AP portable projection. Electronically Signed   By: Delanna Ahmadi M.D.   On: 06/17/2022 18:57     Scheduled Meds:  insulin aspart  0-9 Units Subcutaneous Q4H   ipratropium-albuterol  3 mL Nebulization QID   methylPREDNISolone sodium succinate  60 mg Intravenous Q12H   mometasone-formoterol  2 puff Inhalation BID   potassium chloride  40 mEq Oral BID   torsemide  40 mg Oral Daily   umeclidinium bromide  1 puff Inhalation Daily    Continuous Infusions:  cefTRIAXone (ROCEPHIN)  IV Stopped (06/17/22 2330)     LOS: 0 days    Time spent: 51min    Domenic Polite, MD [facility]   06/18/2022, 11:44 AM

## 2022-06-18 NOTE — Progress Notes (Signed)
PT Cancellation Note  Patient Details Name: Brittany [name] MRN: [id number] DOB: [birth year]   Cancelled Treatment:    Reason Eval/Treat Not Completed: Other (comment)  Patient adamant she will not mobilize until after she has eaten. Currently noted to be NPO. Will reattempt later today if pt agreeable.   [place]  Office [phone]  Rexanne Mano 06/18/2022, 8:56 AM

## 2022-06-18 NOTE — Evaluation (Signed)
Occupational Therapy Evaluation Patient Details Name: Brittany [name] MRN: [id number] DOB: [birth year] Today's Date: 06/18/2022   History of Present Illness 61 y.o. female presents to ED 06/17/22 with SOB / respiratory distress. +BiPAP; PMH significant of PAF on eliquis, COPD on home oxygen, HTN, small cell lung CA in remission, CAD s/p CABG, HFrEF (20-25%) s/p ICD and Barostim.   Clinical Impression   Pt admitted for concerns listed above. PTA pt reported that she was independent with all ADL's and IADL's. At this time, pt is limited by decreased activity tolerance. She continues to be independent with all ADL's and functional mobility. Pt has no further skilled OT needs and acute OT will sign off.       Recommendations for follow up therapy are one component of a multi-disciplinary discharge planning process, led by the attending physician.  Recommendations may be updated based on patient status, additional functional criteria and insurance authorization.   Follow Up Recommendations  No OT follow up    Assistance Recommended at Discharge None  Patient can return home with the following      Functional Status Assessment  Patient has had a recent decline in their functional status and demonstrates the ability to make significant improvements in function in a reasonable and predictable amount of time.  Equipment Recommendations  None recommended by OT    Recommendations for Other Services       Precautions / Restrictions Precautions Precautions: Fall Precaution Comments: Watch O2 Restrictions Weight Bearing Restrictions: No      Mobility Bed Mobility Overal bed mobility: Independent                  Transfers                   General transfer comment: refused OOB mobility, pt most likely indep      Balance Overall balance assessment: Mild deficits observed, not formally tested                                         ADL either  performed or assessed with clinical judgement   ADL Overall ADL's : At baseline;Modified independent                                             Vision Baseline Vision/History: 1 Wears glasses Ability to See in Adequate Light: 0 Adequate Patient Visual Report: No change from baseline Vision Assessment?: No apparent visual deficits     Perception     Praxis      Pertinent Vitals/Pain Pain Assessment Pain Assessment: No/denies pain     Hand Dominance Right   Extremity/Trunk Assessment Upper Extremity Assessment Upper Extremity Assessment: Overall WFL for tasks assessed   Lower Extremity Assessment Lower Extremity Assessment: Overall WFL for tasks assessed   Cervical / Trunk Assessment Cervical / Trunk Assessment: Normal   Communication Communication Communication: No difficulties   Cognition Arousal/Alertness: Awake/alert Behavior During Therapy: WFL for tasks assessed/performed Overall Cognitive Status: Within Functional Limits for tasks assessed                                       General Comments  VSS on RA,  pt reports that she uses O2 due to increased work breathing    Exercises     Shoulder Instructions      Home Living Family/patient expects to be discharged to:: Private residence Living Arrangements: Spouse/significant other Available Help at Discharge: Friend(s);Available PRN/intermittently Type of Home: House Home Access: Level entry     Home Layout: Two level;Able to live on main level with bedroom/bathroom     Bathroom Shower/Tub: Occupational psychologist: Standard     Home Equipment: Rollator (4 wheels)          Prior Functioning/Environment Prior Level of Function : Independent/Modified Independent;Driving                        OT Problem List: Cardiopulmonary status limiting activity      OT Treatment/Interventions:      OT Goals(Current goals can be found in the care  plan section) Acute Rehab OT Goals Patient Stated Goal: To go home OT Goal Formulation: All assessment and education complete, DC therapy Time For Goal Achievement: 06/18/22 Potential to Achieve Goals: Good  OT Frequency:      Co-evaluation              AM-PAC OT "6 Clicks" Daily Activity     Outcome Measure Help from another person eating meals?: None Help from another person taking care of personal grooming?: None Help from another person toileting, which includes using toliet, bedpan, or urinal?: None Help from another person bathing (including washing, rinsing, drying)?: None Help from another person to put on and taking off regular upper body clothing?: None Help from another person to put on and taking off regular lower body clothing?: None 6 Click Score: 24   End of Session Equipment Utilized During Treatment: Oxygen Nurse Communication: Mobility status  Activity Tolerance: Patient tolerated treatment well Patient left: in bed;with call bell/phone within reach  OT Visit Diagnosis: Unsteadiness on feet (R26.81);Muscle weakness (generalized) (M62.81)                Time: 2423-5361 OT Time Calculation (min): 14 min Charges:  OT General Charges $OT Visit: 1 Visit OT Evaluation $OT Eval Low Complexity: [place]., OTR/L Acute Rehabilitation  Mike Hamre Elane Yolanda Bonine 06/18/2022, 5:59 PM

## 2022-06-18 NOTE — Evaluation (Signed)
Physical Therapy Evaluation and Discharge Patient Details Name: Brittany [name] MRN: [id number] DOB: 1961/08/09 Today's Date: 06/18/2022  History of Present Illness  61 y.o. female presents to ED 06/17/22 with SOB / respiratory distress. +BiPAP; PMH significant of PAF on eliquis, COPD on home oxygen, HTN, small cell lung CA in remission, CAD s/p CABG, HFrEF (20-25%) s/p ICD and Barostim.  Clinical Impression   Patient evaluated by Physical Therapy with no further acute PT needs identified. All education has been completed (re: importance of activity and referral to be made to Mobility Team). Patient upset that she was referred for PT and does not want further PT. "I just need someone to walk with me!" Agree and referral made to Mobility Team. PT is signing off. Thank you for this referral.        Recommendations for follow up therapy are one component of a multi-disciplinary discharge planning process, led by the attending physician.  Recommendations may be updated based on patient status, additional functional criteria and insurance authorization.  Follow Up Recommendations No PT follow up      Assistance Recommended at Discharge PRN  Patient can return home with the following  Help with stairs or ramp for entrance    Equipment Recommendations None recommended by PT  Recommendations for Other Services  Other (comment) (mobility team)    Functional Status Assessment Patient has not had a recent decline in their functional status     Precautions / Restrictions Precautions Precautions: Fall Precaution Comments: Watch O2 Restrictions Weight Bearing Restrictions: No      Mobility  Bed Mobility Overal bed mobility: Independent             General bed mobility comments: out and into bed    Transfers Overall transfer level: Independent                      Ambulation/Gait Ambulation/Gait assistance: Min assist Gait Distance (Feet): 100 Feet Assistive  device: 1 person hand held assist Gait Pattern/deviations: Step-through pattern, Decreased stride length Gait velocity: decr due to dyspnea; sats 95-97% on room air     General Gait Details: initiated gait without HHA, but pt felt she needed something to hold onto (especially with strong coughing); no imbalance  Stairs            Wheelchair Mobility    Modified Rankin (Stroke Patients Only)       Balance Overall balance assessment: Mild deficits observed, not formally tested                                           Pertinent Vitals/Pain Pain Assessment Pain Assessment: No/denies pain    Home Living Family/patient expects to be discharged to:: Private residence Living Arrangements: Spouse/significant other Available Help at Discharge: Friend(s);Available PRN/intermittently Type of Home: House Home Access: Level entry       Home Layout: Two level;Able to live on main level with bedroom/bathroom Home Equipment: Rollator (4 wheels)      Prior Function Prior Level of Function : Independent/Modified Independent;Driving                     Hand Dominance   Dominant Hand: Right    Extremity/Trunk Assessment   Upper Extremity Assessment Upper Extremity Assessment: Defer to OT evaluation    Lower Extremity Assessment Lower Extremity Assessment: Overall WFL for  tasks assessed    Cervical / Trunk Assessment Cervical / Trunk Assessment: Normal  Communication   Communication: No difficulties  Cognition Arousal/Alertness: Awake/alert Behavior During Therapy:  (frustrated) Overall Cognitive Status: Within Functional Limits for tasks assessed                                          General Comments General comments (skin integrity, edema, etc.): Patient upset that MD had ordered PT    Exercises     Assessment/Plan    PT Assessment Patient does not need any further PT services  PT Problem List         PT  Treatment Interventions      PT Goals (Current goals can be found in the Care Plan section)  Acute Rehab PT Goals Patient Stated Goal: to get better and go home PT Goal Formulation: All assessment and education complete, DC therapy    Frequency       Co-evaluation               AM-PAC PT "6 Clicks" Mobility  Outcome Measure Help needed turning from your back to your side while in a flat bed without using bedrails?: None Help needed moving from lying on your back to sitting on the side of a flat bed without using bedrails?: None Help needed moving to and from a bed to a chair (including a wheelchair)?: None Help needed standing up from a chair using your arms (e.g., wheelchair or bedside chair)?: None Help needed to walk in hospital room?: A Little Help needed climbing 3-5 steps with a railing? : A Little 6 Click Score: 22    End of Session   Activity Tolerance: Patient limited by fatigue Patient left: in bed;with call bell/phone within reach Nurse Communication: Mobility status PT Visit Diagnosis: Difficulty in walking, not elsewhere classified (R26.2)    Time: 2010-0712 PT Time Calculation (min) (ACUTE ONLY): 19 min   Charges:   PT Evaluation $PT Eval Low Complexity: [place], PT Acute Rehabilitation Services  Office [phone]   Rexanne Mano 06/18/2022, 3:41 PM

## 2022-06-18 NOTE — Plan of Care (Signed)
  Problem: Fluid Volume: Goal: Ability to maintain a balanced intake and output will improve Outcome: Progressing   Problem: Health Behavior/Discharge Planning: Goal: Ability to identify and utilize available resources and services will improve Outcome: Progressing Goal: Ability to manage health-related needs will improve Outcome: Progressing   Problem: Metabolic: Goal: Ability to maintain appropriate glucose levels will improve Outcome: Progressing   Problem: Nutritional: Goal: Maintenance of adequate nutrition will improve Outcome: Progressing   Problem: Skin Integrity: Goal: Risk for impaired skin integrity will decrease Outcome: Progressing

## 2022-06-18 NOTE — Progress Notes (Signed)
Pt in no distress at this time requiring bipap.  RT will cont to monitor.

## 2022-06-19 DIAGNOSIS — J441 Chronic obstructive pulmonary disease with (acute) exacerbation: Secondary | ICD-10-CM | POA: Diagnosis not present

## 2022-06-19 LAB — BASIC METABOLIC PANEL
Anion gap: 13 (ref 5–15)
BUN: 46 mg/dL — ABNORMAL HIGH (ref 8–23)
CO2: 21 mmol/L — ABNORMAL LOW (ref 22–32)
Calcium: 9 mg/dL (ref 8.9–10.3)
Chloride: 100 mmol/L (ref 98–111)
Creatinine, Ser: 1.55 mg/dL — ABNORMAL HIGH (ref 0.44–1.00)
GFR, Estimated: 38 mL/min — ABNORMAL LOW (ref >60)
Glucose, Bld: 202 mg/dL — ABNORMAL HIGH (ref 70–99)
Potassium: 4.3 mmol/L (ref 3.5–5.1)
Sodium: 134 mmol/L — ABNORMAL LOW (ref 135–145)

## 2022-06-19 LAB — CBC
HCT: 44.2 % (ref 36.0–46.0)
Hemoglobin: 14.6 g/dL (ref 12.0–15.0)
MCH: 30 pg (ref 26.0–34.0)
MCHC: 33 g/dL (ref 30.0–36.0)
MCV: 90.9 fL (ref 80.0–100.0)
Platelets: 159 10*3/uL (ref 150–400)
RBC: 4.86 MIL/uL (ref 3.87–5.11)
RDW: 19 % — ABNORMAL HIGH (ref 11.5–15.5)
WBC: 8.1 10*3/uL (ref 4.0–10.5)
nRBC: 0 % (ref 0.0–0.2)

## 2022-06-19 LAB — GLUCOSE, CAPILLARY
Glucose-Capillary: 161 mg/dL — ABNORMAL HIGH (ref 70–99)
Glucose-Capillary: 163 mg/dL — ABNORMAL HIGH (ref 70–99)
Glucose-Capillary: 167 mg/dL — ABNORMAL HIGH (ref 70–99)
Glucose-Capillary: 181 mg/dL — ABNORMAL HIGH (ref 70–99)
Glucose-Capillary: 190 mg/dL — ABNORMAL HIGH (ref 70–99)
Glucose-Capillary: 194 mg/dL — ABNORMAL HIGH (ref 70–99)
Glucose-Capillary: 95 mg/dL (ref 70–99)

## 2022-06-19 MED ORDER — POTASSIUM CHLORIDE CRYS ER 20 MEQ PO TBCR
20.0000 meq | EXTENDED_RELEASE_TABLET | Freq: Every day | ORAL | Status: DC
Start: 2022-06-19 — End: 2022-06-20
  Administered 2022-06-19 – 2022-06-20 (×2): 20 meq via ORAL
  Filled 2022-06-19 (×2): qty 1

## 2022-06-19 MED ORDER — GLUCERNA SHAKE PO LIQD
237.0000 mL | Freq: Three times a day (TID) | ORAL | Status: DC
  Administered 2022-06-19 (×2): 237 mL via ORAL

## 2022-06-19 MED ORDER — PREDNISONE 20 MG PO TABS
40.0000 mg | ORAL_TABLET | Freq: Every day | ORAL | Status: DC
  Administered 2022-06-19 – 2022-06-20 (×2): 40 mg via ORAL
  Filled 2022-06-19 (×2): qty 2

## 2022-06-19 NOTE — Progress Notes (Signed)
Initial Nutrition Assessment  DOCUMENTATION CODES:   Non-severe (moderate) malnutrition in context of chronic illness  INTERVENTION:   Glucerna Shake po TID, each supplement provides 220 kcal and 10 grams of protein  Encouraged smaller, more frequent meals given early satiety and SOB   Consider Mechanical Soft (Dysphagia 3) diet if pt easily fatigued with eating and chewing. Recommend liberalizing diet to Regular on follow-up if po intake inadequate.   NUTRITION DIAGNOSIS:   Moderate Malnutrition related to chronic illness (COPD, chronic HF with EF 20-25%, hx small cell lung ca in remission) as evidenced by mild fat depletion, mild muscle depletion.  GOAL:   Patient will meet greater than or equal to 90% of their needs  MONITOR:   PO intake, Supplement acceptance, Weight trends, Labs  REASON FOR ASSESSMENT:   Consult Assessment of nutrition requirement/status  ASSESSMENT:   60 yo female admitted with acute on chronic respiratory failure secondary to acute COPD exacerbation. PMH includs chronic HF with ischemic CM and EF 20-25% (ECHO 1/23), DM, CKD 3, CAD s/p CABG, small cell lung cancer in remission.  Pt sitting up on side of bed on visit today; pt is SOB with just conversation. RD tried to limit discussion.   Pt reports very poor appetite at home prior to admission, eating very little due to early satiety and SOB;  pt reports currently appetite is improved as she is on prednisone. Pt reports she ate about 50% of breakfast this AM. Not yet ordered lunch. No recorded po intake.   Pt reports she has been drinking about 1 Ensure per day at home. Prefers to be on Glucerna Shakes while on prednisone given her blood sugars. RD to place orders for supplement  Current wt 44.9 kg (99 pounds); reports UBW around 103 pounds.   Labs: sodium 134 (L), Creatinine 1.55, BUN 46 Meds: ss novolog, prednisone, KCl, torsemide   NUTRITION - FOCUSED PHYSICAL EXAM:  Flowsheet Row Most Recent  Value  Orbital Region Mild depletion  Upper Arm Region Mild depletion  Thoracic and Lumbar Region Mild depletion  Buccal Region Mild depletion  Temple Region Mild depletion  Clavicle Bone Region Mild depletion  Clavicle and Acromion Bone Region Mild depletion  Scapular Bone Region Mild depletion  Dorsal Hand No depletion  Patellar Region Mild depletion  Anterior Thigh Region Mild depletion  Posterior Calf Region Mild depletion  Edema (RD Assessment) None       Diet Order:   Diet Order             Diet Carb Modified Fluid consistency: Thin; Room service appropriate? Yes  Diet effective now                   EDUCATION NEEDS:   Education needs have been addressed  Skin:  Skin Assessment: Reviewed RN Assessment  Last BM:  7/8  Height:   Ht Readings from Last 1 Encounters:  06/18/22 4\' 11"  (1.499 m)    Weight:   Wt Readings from Last 1 Encounters:  06/18/22 44.9 kg    BMI:  Body mass index is 19.99 kg/m.  Estimated Nutritional Needs:   Kcal:  1600-1800 kcals  Protein:  75-85 g  Fluid:  >/= 1.6 L    Kerman Passey MS, RDN, LDN, CNSC Registered Dietitian 3 Clinical Nutrition RD Pager and On-Call Pager Number Located in [place]

## 2022-06-19 NOTE — Progress Notes (Signed)
PROGRESS NOTE    Brittany [name]  IEP:[id number] DOB: 1961/06/20 DOA: 06/17/2022 PCP: Traci Sermon, PA-C  60/F with history of paroxysmal A-fib on Eliquis, COPD, hypertension, small cell lung cancer in remission, chronic systolic CHF with EF 66-06%, CAD/CABG presented to the ED with shortness of breath, cough and wheezing.  Denies swelling or orthopnea, compliant with diuretics and low-salt diet In the ED vitals stable, BNP 862, troponin 40, creatinine 1.5, chest x-ray noted cardiomegaly without acute findings  Subjective: Feels better, breathing is improving, does not feel well enough to go home today  Assessment and Plan:  COPD exacerbation ([place]) -Presenting with productive cough wheezing, clinically do not suspect acute CHF exacerbation  -Clinically improving, weaned off O2, transition to prednisone, continue DuoNebs -Uses O2 as needed at home at baseline -COVID PCR is negative, continue empiric Rocephin 1 more day  Acute on chronic respiratory failure with hypoxia (HCC) -Required BiPAP briefly on admission last night, now off and stable -Wean O2  AF (paroxysmal atrial fibrillation) (HCC) Cont eliquis  Chronic systolic CHF/ischemic cardiomyopathy -Last echo 1/23 with EF 20-25% which is stable from her prior baselines -Compliant with torsemide, reports that she takes 40 Mg daily and not 60 -Continue Aldactone, Farxiga and Corlanor, reportedly failed Entresto in the past -Pt is status post AICD and Barostim devices. -Followed by advanced heart failure team at baseline -Clinically appears euvolemic, continue current meds  Type 2 diabetes mellitus -Continue Farxiga, A1c was 5.9 in April  CKD 3a -Creatinine stable at baseline, monitor  S/P CABG (coronary artery bypass graft) CAD s/p CABG -Stable  Small cell lung cancer ([place]) Apparently in sustained remission following chemo + radiation in 2016. -Followed by Dr. Julien Nordmann, getting annual surveillance  scans  DVT prophylaxis: Apixaban Code Status: Full code Family Communication: Discussed patient detail no family at bedside Disposition Plan: Home tomorrow  Consultants:    Procedures:   Antimicrobials:    Objective: Vitals:   06/19/22 0432 06/19/22 0750 06/19/22 0829 06/19/22 1116  BP: 92/60 (!) 96/57  (!) 122/56  Pulse: 69 70  83  Resp: 14 19  18   Temp: 97.9 F (36.6 C) 97.9 F (36.6 C)  98.1 F (36.7 C)  TempSrc: Oral   Oral  SpO2: 99% 99% 98% 96%  Weight:      Height:        Intake/Output Summary (Last 24 hours) at 06/19/2022 1334 Last data filed at 06/19/2022 0433 Gross per 24 hour  Intake 720 ml  Output 900 ml  Net -180 ml   Filed Weights   06/18/22 1338  Weight: 44.9 kg    Examination:  General exam: Chronically ill female laying in bed, AAOx3, no distress HEENT: No JVD, surgical scars noted CVs: S1-S2, regular rhythm Lungs: Improving air movement, no expiratory wheezes today Abdomen: Soft, obese, nontender, bowel sounds present Extremities: No edema  Skin: No rashes Psychiatry:  Mood & affect appropriate.     Data Reviewed:   CBC: Recent Labs  Lab 06/17/22 1922 06/17/22 1947 06/18/22 0446 06/19/22 0233  WBC 4.9  --  3.2* 8.1  NEUTROABS 3.0  --   --   --   HGB 15.9* 17.7* 14.7 14.6  HCT 50.3* 52.0* 47.4* 44.2  MCV 93.7  --  95.8 90.9  PLT 153  --  154 301   Basic Metabolic Panel: Recent Labs  Lab 06/17/22 1922 06/17/22 1947 06/18/22 0446 06/19/22 0233  NA 138 137 135 134*  K 3.6 3.6 3.4* 4.3  CL  97*  --  98 100  CO2 24  --  23 21*  GLUCOSE 102*  --  252* 202*  BUN 26*  --  33* 46*  CREATININE 1.55*  --  1.62* 1.55*  CALCIUM 9.7  --  9.1 9.0  MG 2.0  --   --   --    GFR: Estimated Creatinine Clearance: 26 mL/min (A) (by C-G formula based on SCr of 1.55 mg/dL (H)). Liver Function Tests: Recent Labs  Lab 06/17/22 1922 06/18/22 0446  AST 30 25  ALT 18 17  ALKPHOS 83 74  BILITOT 1.1 0.6  PROT 8.1 7.4  ALBUMIN 4.5 3.9    No results for input(s): "LIPASE", "AMYLASE" in the last 168 hours. No results for input(s): "AMMONIA" in the last 168 hours. Coagulation Profile: No results for input(s): "INR", "PROTIME" in the last 168 hours. Cardiac Enzymes: No results for input(s): "CKTOTAL", "CKMB", "CKMBINDEX", "TROPONINI" in the last 168 hours. BNP (last 3 results) Recent Labs    07/19/21 0945  PROBNP 3,162*   HbA1C: No results for input(s): "HGBA1C" in the last 72 hours. CBG: Recent Labs  Lab 06/18/22 2130 06/19/22 0014 06/19/22 0443 06/19/22 0748 06/19/22 1114  GLUCAP 167* 190* 181* 167* 163*   Lipid Profile: No results for input(s): "CHOL", "HDL", "LDLCALC", "TRIG", "CHOLHDL", "LDLDIRECT" in the last 72 hours. Thyroid Function Tests: No results for input(s): "TSH", "T4TOTAL", "FREET4", "T3FREE", "THYROIDAB" in the last 72 hours. Anemia Panel: No results for input(s): "VITAMINB12", "FOLATE", "FERRITIN", "TIBC", "IRON", "RETICCTPCT" in the last 72 hours. Urine analysis:    Component Value Date/Time   COLORURINE STRAW (A) 04/09/2022 1414   APPEARANCEUR CLEAR 04/09/2022 1414   LABSPEC 1.010 04/09/2022 1414   PHURINE 7.0 04/09/2022 1414   GLUCOSEU >=500 (A) 04/09/2022 1414   HGBUR NEGATIVE 04/09/2022 1414   BILIRUBINUR NEGATIVE 04/09/2022 1414   KETONESUR NEGATIVE 04/09/2022 1414   PROTEINUR NEGATIVE 04/09/2022 1414   UROBILINOGEN 1.0 03/15/2015 1117   NITRITE NEGATIVE 04/09/2022 1414   LEUKOCYTESUR TRACE (A) 04/09/2022 1414   Sepsis Labs: @LABRCNTIP (procalcitonin:4,lacticidven:4)  ) Recent Results (from the past 240 hour(s))  SARS Coronavirus 2 by RT PCR (hospital order, performed in [place] hospital lab) *cepheid single result test* Anterior Nasal Swab     Status: None   Collection Time: 06/17/22  6:39 PM   Specimen: Anterior Nasal Swab  Result Value Ref Range Status   SARS Coronavirus 2 by RT PCR NEGATIVE NEGATIVE Final    Comment: (NOTE) SARS-CoV-2 target nucleic acids are  NOT DETECTED.  The SARS-CoV-2 RNA is generally detectable in upper and lower respiratory specimens during the acute phase of infection. The lowest concentration of SARS-CoV-2 viral copies this assay can detect is 250 copies / mL. A negative result does not preclude SARS-CoV-2 infection and should not be used as the sole basis for treatment or other patient management decisions.  A negative result may occur with improper specimen collection / handling, submission of specimen other than nasopharyngeal swab, presence of viral mutation(s) within the areas targeted by this assay, and inadequate number of viral copies (<250 copies / mL). A negative result must be combined with clinical observations, patient history, and epidemiological information.  Fact Sheet for Patients:   https://www.patel.info/  Fact Sheet for Healthcare Providers: https://hall.com/  This test is not yet approved or  cleared by the [place] FDA and has been authorized for detection and/or diagnosis of SARS-CoV-2 by FDA under an Emergency Use Authorization (EUA).  This EUA will  remain in effect (meaning this test can be used) for the duration of the COVID-19 declaration under Section 564(b)(1) of the Act, 21 U.S.C. section 360bbb-3(b)(1), unless the authorization is terminated or revoked sooner.  Performed at [facility], [place] [address], [place], [place] [address]      Radiology Studies: DG Chest Portable 1 View  Result Date: 06/17/2022 CLINICAL DATA:  Shortness of breath EXAM: PORTABLE CHEST 1 VIEW COMPARISON:  04/11/2022 FINDINGS: Cardiomegaly status post median sternotomy and CABG. Left chest single lead pacer. Both lungs are clear. The visualized skeletal structures are unremarkable. IMPRESSION: Cardiomegaly without acute abnormality of the lungs in AP portable projection. Electronically Signed   By: Delanna Ahmadi M.D.   On: 06/17/2022 18:57     Scheduled  Meds:  apixaban  2.5 mg Oral BID   dapagliflozin propanediol  10 mg Oral Daily   insulin aspart  0-9 Units Subcutaneous TID WC   ipratropium-albuterol  3 mL Nebulization TID   ivabradine  7.5 mg Oral BID WC   mometasone-formoterol  2 puff Inhalation BID   potassium chloride  20 mEq Oral Daily   predniSONE  40 mg Oral Q breakfast   spironolactone  12.5 mg Oral Daily   torsemide  40 mg Oral Daily   umeclidinium bromide  1 puff Inhalation Daily   Continuous Infusions:  cefTRIAXone (ROCEPHIN)  IV 1 g (06/18/22 2234)     LOS: 1 day    Time spent: 27min  Domenic Polite, MD [facility]   06/19/2022, 1:34 PM

## 2022-06-19 NOTE — Progress Notes (Signed)
Pt. refused Bipap, Pt in no distress at this time to require Bipap. RT will continue to monitor.

## 2022-06-19 NOTE — Progress Notes (Signed)
Mobility Specialist Progress Note    06/19/22 1101  Mobility  Activity Ambulated independently in hallway  Level of Assistance Standby assist, set-up cues, supervision of patient - no hands on  Assistive Device None  Distance Ambulated (ft) 210 ft  Activity Response Tolerated fair  $Mobility charge 1 Mobility   Pre-Mobility: 84 HR, 98% SpO2 Post-Mobility: 80 HR, 97% SpO2  Pt received in bed and agreeable. No complaints on walk. Desatted on RA but recovered with coughing. Returned to bed with call bell in reach. RN notified.     Hildred Alamin Mobility Specialist

## 2022-06-19 NOTE — TOC Progression Note (Signed)
Transition of Care [facility]) - Progression Note    Patient Details  Name: Brittany [name] MRN: [id number] Date of Birth: 1961-05-28  Transition of Care [facility]) CM/SW [place], RN Phone Number:[phone]  06/19/2022, 9:34 AM  Clinical Narrative:    CM has received request to assist with faxing letter verifying that patient is admitted to the hospital. Nurse has provided info for fax. Letter faxed to [facility] [phone].        Expected Discharge Plan and Services                                                 Social Determinants of Health (SDOH) Interventions    Readmission Risk Interventions    04/11/2021   10:16 AM  Readmission Risk Prevention Plan  Transportation Screening Complete  PCP or Specialist Appt within 3-5 Days Complete  HRI or [place] Complete  Social Work Consult for [place] Planning/Counseling Complete  Palliative Care Screening Not Applicable  Medication Review Press photographer) Complete

## 2022-06-19 NOTE — Plan of Care (Signed)
  Problem: Fluid Volume: Goal: Ability to maintain a balanced intake and output will improve Outcome: Progressing   Problem: Nutritional: Goal: Maintenance of adequate nutrition will improve Outcome: Progressing   Problem: Skin Integrity: Goal: Risk for impaired skin integrity will decrease Outcome: Progressing   Problem: Activity: Goal: Risk for activity intolerance will decrease Outcome: Progressing   Problem: Elimination: Goal: Will not experience complications related to bowel motility Outcome: Progressing Goal: Will not experience complications related to urinary retention Outcome: Progressing   Problem: Pain Managment: Goal: General experience of comfort will improve Outcome: Progressing

## 2022-06-20 ENCOUNTER — Other Ambulatory Visit (HOSPITAL_COMMUNITY): Payer: Self-pay

## 2022-06-20 DIAGNOSIS — J441 Chronic obstructive pulmonary disease with (acute) exacerbation: Secondary | ICD-10-CM | POA: Diagnosis not present

## 2022-06-20 DIAGNOSIS — E440 Moderate protein-calorie malnutrition: Secondary | ICD-10-CM | POA: Insufficient documentation

## 2022-06-20 LAB — BASIC METABOLIC PANEL
Anion gap: 14 (ref 5–15)
BUN: 56 mg/dL — ABNORMAL HIGH (ref 8–23)
CO2: 21 mmol/L — ABNORMAL LOW (ref 22–32)
Calcium: 9.4 mg/dL (ref 8.9–10.3)
Chloride: 101 mmol/L (ref 98–111)
Creatinine, Ser: 1.56 mg/dL — ABNORMAL HIGH (ref 0.44–1.00)
GFR, Estimated: 38 mL/min — ABNORMAL LOW (ref >60)
Glucose, Bld: 103 mg/dL — ABNORMAL HIGH (ref 70–99)
Potassium: 4.1 mmol/L (ref 3.5–5.1)
Sodium: 136 mmol/L (ref 135–145)

## 2022-06-20 LAB — GLUCOSE, CAPILLARY
Glucose-Capillary: 112 mg/dL — ABNORMAL HIGH (ref 70–99)
Glucose-Capillary: 97 mg/dL (ref 70–99)
Glucose-Capillary: 203 mg/dL — ABNORMAL HIGH (ref 70–99)

## 2022-06-20 MED ORDER — PREDNISONE 20 MG PO TABS: 20.0000 mg | ORAL_TABLET | Freq: Every day | ORAL | 0 refills | Status: DC

## 2022-06-20 MED ORDER — ALBUTEROL SULFATE HFA 108 (90 BASE) MCG/ACT IN AERS
2.0000 | INHALATION_SPRAY | Freq: Four times a day (QID) | RESPIRATORY_TRACT | 1 refills | Status: AC | PRN
Start: 2022-06-20 — End: ?
  Filled 2022-06-20: qty 8.5, 30d supply, fill #0

## 2022-06-20 MED ORDER — APIXABAN 2.5 MG PO TABS
2.5000 mg | ORAL_TABLET | Freq: Two times a day (BID) | ORAL | 0 refills | Status: DC
  Filled 2022-06-20: qty 60, 30d supply, fill #0

## 2022-06-20 MED ORDER — ALBUTEROL SULFATE HFA 108 (90 BASE) MCG/ACT IN AERS: 2.0000 | INHALATION_SPRAY | Freq: Four times a day (QID) | RESPIRATORY_TRACT | 1 refills | Status: DC | PRN

## 2022-06-20 MED ORDER — TORSEMIDE 20 MG PO TABS: 40.0000 mg | ORAL_TABLET | Freq: Every day | ORAL | Status: DC

## 2022-06-20 MED ORDER — IPRATROPIUM-ALBUTEROL 0.5-2.5 (3) MG/3ML IN SOLN
3.0000 mL | RESPIRATORY_TRACT | 1 refills | Status: DC | PRN
Start: 2022-06-20 — End: 2022-07-12

## 2022-06-20 MED ORDER — POTASSIUM CHLORIDE CRYS ER 20 MEQ PO TBCR: 20.0000 meq | EXTENDED_RELEASE_TABLET | Freq: Every day | ORAL | Status: DC

## 2022-06-20 MED ORDER — PREDNISONE 20 MG PO TABS
20.0000 mg | ORAL_TABLET | Freq: Every day | ORAL | 0 refills | Status: DC
  Filled 2022-06-20: qty 8, 4d supply, fill #0

## 2022-06-20 NOTE — TOC CM/SW Note (Signed)
SATURATION QUALIFICATIONS: (This note is used to comply with regulatory documentation for home oxygen)  Patient Saturations on Room Air at Rest = 95%  Patient Saturations on Room Air while Ambulating = 87%  Patient Saturations on 2 Liters of oxygen while Ambulating = 99%  Please briefly explain why patient needs home oxygen: COPD, HF

## 2022-06-20 NOTE — TOC Progression Note (Addendum)
Transition of Care [facility]) - Progression Note    Patient Details  Name: Brittany [name] MRN: [id number] Date of Birth: 27-Dec-1960  Transition of Care [facility]) CM/SW [place], RN Phone Number:[phone]  06/20/2022, 9:46 AM  Clinical Narrative:    Cm received message stating that patient is requesting assistance with home O2 . Per nurse patient states that her home O2 tank is broken and she can not recall name of agency that provided home O2. Cm at bedside to discuss home O2 with patient. Patient states that she had O2 after she was discharged from rehab. Patient states that she returned all portable tanks but kept the concentrator which does not have the name of the DME company on the tank. Patient states that the current concentrator is broken and that she is not sure how long it has been broken. Patient then states that she moved out of her apartment and the concentrator is in storage so she borrowed a tank from her sisters daughter who had a baby that needed O2. CM inquired if patient has been wearing O2 . Patient states that she only needs her O2 when her fluid is building up. Patient states that her O2 sats do not drop and that she just has labored breathing sometimes. CM has advised patient that staff would need to check ambulatory sats to determine if patient qualifies for O2. Patient states that she has 3 insurances and should not have to qualify. CM has explained that insurance will not cover O2 without patient meeting the criteria for O2. Patient verbalized understanding. CM spoke with nurse and asked nurse to get ambulatory sats to determine if patient qualifies for home O2. Nurse states that patient has refused to allow staff to check O2 sats or ambulatory sats for O2 qualification.          Expected Discharge Plan and Services           Expected Discharge Date: 06/20/22                                     Social Determinants of Health (SDOH)  Interventions    Readmission Risk Interventions    04/11/2021   10:16 AM  Readmission Risk Prevention Plan  Transportation Screening Complete  PCP or Specialist Appt within 3-5 Days Complete  HRI or [place] Complete  Social Work Consult for [place] Planning/Counseling Complete  Palliative Care Screening Not Applicable  Medication Review Press photographer) Complete

## 2022-06-20 NOTE — Progress Notes (Signed)
Discharged home accompanied by friend, Belongings taken home.

## 2022-06-20 NOTE — Progress Notes (Signed)
Requested for assistance for  home oxygen  to use PRN. Claimed that she used oxygen at home before when she discharged from rehab. Cannot recall the name of the company that supplied the oxygen. Care management  made aware,came to see pt at once and advised to do oxygen ambulation.  Refused to   have oxygen checked on ambulation  to see if she is qualified for oxygen as per  requisite by the insurance,  claiming "dont worry, if you cannot do anything I will just follow it up with my insurance. CM made aware.

## 2022-06-20 NOTE — Progress Notes (Signed)
All set for discharge home, awaiting delivery of home O2.

## 2022-06-20 NOTE — Discharge Summary (Signed)
Physician Discharge Summary  Brittany [name] OVF:[id number] DOB: 1961-06-08 DOA: 06/17/2022  PCP: Traci Sermon, PA-C  Admit date: 06/17/2022 Discharge date: 06/20/2022  Time spent: 35 minutes  Recommendations for Outpatient Follow-up:  PCP in 1 week Cardiology Dr. [name] in 1 month   Discharge Diagnoses:  Principal Problem:   COPD exacerbation ([place]) Active Problems:   Acute on chronic respiratory failure with hypoxia (HCC)   AF (paroxysmal atrial fibrillation) (HCC)   Small cell lung cancer (HCC)   S/P CABG (coronary artery bypass graft)   Type 2 diabetes mellitus with stage 3a chronic kidney disease, without long-term current use of insulin (HCC)   Chronic HFrEF (heart failure with reduced ejection fraction) (HCC)   Malnutrition of moderate degree   Discharge Condition: Stable  Diet recommendation: Low-sodium, diabetic  Filed Weights   06/18/22 1338  Weight: 44.9 kg    History of present illness:  60/F with history of paroxysmal A-fib on Eliquis, COPD, hypertension, small cell lung cancer in remission, chronic systolic CHF with EF 84-16%, CAD/CABG presented to the ED with shortness of breath, cough and wheezing.  Denies swelling or orthopnea, compliant with diuretics and low-salt diet In the ED vitals stable, BNP 862, troponin 40, creatinine 1.5, chest x-ray noted cardiomegaly without acute findings  Hospital Course:   COPD exacerbation ([place]) -Presenting with productive cough wheezing, clinically do not suspect acute CHF exacerbation  -Treated with IV steroids and DuoNebs, clinically improving, weaned off O2, transitioned to prednisone taper, weaned off O2 at rest -Uses O2 as needed at home at baseline -Discharged home in stable condition   Acute on chronic respiratory failure with hypoxia (HCC) -Required BiPAP briefly on admission , now off and stable -Weaned off O2 at rest, apparently uses O2 as needed at home   AF (paroxysmal atrial fibrillation)  (HCC) Cont eliquis   Chronic systolic CHF/ischemic cardiomyopathy -Last echo 1/23 with EF 20-25% which is stable from her prior baselines -Compliant with torsemide, reports that she takes 40 Mg daily and not 60m  -Continue Aldactone, Farxiga and Corlanor, reportedly failed Entresto in the past -Pt is status post AICD and Barostim devices. -Followed by advanced heart failure team at baseline -Clinically euvolemic, continue current meds,, left torsemide dose at 40 Mg daily at discharge which is the dose she has been taking for several months, blood pressure soft in the 95-100 range   Type 2 diabetes mellitus -Continue Farxiga, A1c was 5.9 in April   CKD 3a -Creatinine stable at baseline, monitor   S/P CABG (coronary artery bypass graft) CAD s/p CABG -Stable   Small cell lung cancer ([place] Apparently in sustained remission following chemo + radiation in 2016. -Followed by Dr. MJulien Nordmann getting annual surveillance scans    Discharge Exam: Vitals:   06/20/22 0725 06/20/22 0807  BP: (!) 90/50   Pulse: 72 72  Resp: (!) 22 18  Temp: 98 F (36.7 C)   SpO2: 96%     General: AAOx3 Cardiovascular: S1S2/RRR Respiratory: improved air movement, no wheezing today  Discharge Instructions   Discharge Instructions     Diet - low sodium heart healthy   Complete by: As directed    Diet Carb Modified   Complete by: As directed    Increase activity slowly   Complete by: As directed       Allergies as of 06/20/2022       Reactions   Codeine Nausea And Vomiting        Medication List  TAKE these medications    albuterol 108 (90 Base) MCG/ACT inhaler Commonly known as: VENTOLIN HFA Inhale 2 puffs into the lungs every 6 (six) hours as needed for wheezing or shortness of breath.   apixaban 2.5 MG Tabs tablet Commonly known as: ELIQUIS Take 1 tablet (2.5 mg total) by mouth 2 (two) times daily. What changed:  medication strength how much to take   atorvastatin 80 MG  tablet Commonly known as: LIPITOR Take 1 tablet (80 mg total) by mouth daily at 6 PM.   blood glucose meter kit and supplies Kit Dispense based on patient and insurance preference. Use up to four times daily as directed.   Farxiga 10 MG Tabs tablet Generic drug: dapagliflozin propanediol TAKE 1 TABLET (10 MG TOTAL) BY MOUTH DAILY BEFORE BREAKFAST.   Glucerna Liqd Take 237 mLs by mouth See admin instructions. Takes 1 bottle twice a week   ipratropium-albuterol 0.5-2.5 (3) MG/3ML Soln Commonly known as: DUONEB Take 3 mLs by nebulization every 4 (four) hours as needed (for shortness of breath).   ivabradine 7.5 MG Tabs tablet Commonly known as: Corlanor TAKE 1 TABLET (7.5 MG TOTAL) BY MOUTH 2 (TWO) TIMES DAILY WITH A MEAL.   metolazone 2.5 MG tablet Commonly known as: ZAROXOLYN Take 2.5 mg by mouth daily as needed (edema).   multivitamin with minerals Tabs tablet Take 1 tablet by mouth daily.   nitroGLYCERIN 0.4 MG SL tablet Commonly known as: NITROSTAT Place 1 tablet (0.4 mg total) under the tongue every 5 (five) minutes as needed for chest pain.   potassium chloride SA 20 MEQ tablet Commonly known as: KLOR-CON M Take 1 tablet (20 mEq total) by mouth daily. TAke extra 13mq when you take higher dose of torsemide or metolazone What changed:  how much to take additional instructions   predniSONE 20 MG tablet Commonly known as: DELTASONE Take 2 tablets (465m daily for 2 days then 1 tablet (2063mdaily for 2 days then STOP Start taking on: June 21, 2022   spironolactone 25 MG tablet Commonly known as: ALDACTONE Take 1 tablet (25 mg total) by mouth daily.   torsemide 20 MG tablet Commonly known as: DEMADEX Take 2 tablets (40 mg total) by mouth daily. What changed: how much to take   Visine 0.025-0.3 % ophthalmic solution Generic drug: naphazoline-pheniramine Place 1 drop into both eyes 4 (four) times daily as needed for eye irritation.       Allergies  Allergen  Reactions   Codeine Nausea And Vomiting      The results of significant diagnostics from this hospitalization (including imaging, microbiology, ancillary and laboratory) are listed below for reference.    Significant Diagnostic Studies: DG Chest Portable 1 View  Result Date: 06/17/2022 CLINICAL DATA:  Shortness of breath EXAM: PORTABLE CHEST 1 VIEW COMPARISON:  04/11/2022 FINDINGS: Cardiomegaly status post median sternotomy and CABG. Left chest single lead pacer. Both lungs are clear. The visualized skeletal structures are unremarkable. IMPRESSION: Cardiomegaly without acute abnormality of the lungs in AP portable projection. Electronically Signed   By: AleDelanna AhmadiD.   On: 06/17/2022 18:57    Microbiology: Recent Results (from the past 240 hour(s))  SARS Coronavirus 2 by RT PCR ([facility]) *cepheid single result test* Anterior Nasal Swab     Status: None   Collection Time: 06/17/22  6:39 PM   Specimen: Anterior Nasal Swab  Result Value Ref Range Status   SARS Coronavirus 2 by RT PCR NEGATIVE  NEGATIVE Final    Comment: (NOTE) SARS-CoV-2 target nucleic acids are NOT DETECTED.  The SARS-CoV-2 RNA is generally detectable in upper and lower respiratory specimens during the acute phase of infection. The lowest concentration of SARS-CoV-2 viral copies this assay can detect is 250 copies / mL. A negative result does not preclude SARS-CoV-2 infection and should not be used as the sole basis for treatment or other patient management decisions.  A negative result may occur with improper specimen collection / handling, submission of specimen other than nasopharyngeal swab, presence of viral mutation(s) within the areas targeted by this assay, and inadequate number of viral copies (<250 copies / mL). A negative result must be combined with clinical observations, patient history, and epidemiological information.  Fact Sheet for Patients:    https://www.patel.info/  Fact Sheet for Healthcare Providers: https://hall.com/  This test is not yet approved or  cleared by the [place] FDA and has been authorized for detection and/or diagnosis of SARS-CoV-2 by FDA under an Emergency Use Authorization (EUA).  This EUA will remain in effect (meaning this test can be used) for the duration of the COVID-19 declaration under Section 564(b)(1) of the Act, 21 U.S.C. section 360bbb-3(b)(1), unless the authorization is terminated or revoked sooner.  Performed at [facility], [place] [address] [place], [place] [address]      Labs: Basic Metabolic Panel: Recent Labs  Lab 06/17/22 1922 06/17/22 1947 06/18/22 0446 06/19/22 0233 06/20/22 0158  NA 138 137 135 134* 136  K 3.6 3.6 3.4* 4.3 4.1  CL 97*  --  98 100 101  CO2 24  --  23 21* 21*  GLUCOSE 102*  --  252* 202* 103*  BUN 26*  --  33* 46* 56*  CREATININE 1.55*  --  1.62* 1.55* 1.56*  CALCIUM 9.7  --  9.1 9.0 9.4  MG 2.0  --   --   --   --    Liver Function Tests: Recent Labs  Lab 06/17/22 1922 06/18/22 0446  AST 30 25  ALT 18 17  ALKPHOS 83 74  BILITOT 1.1 0.6  PROT 8.1 7.4  ALBUMIN 4.5 3.9   No results for input(s): "LIPASE", "AMYLASE" in the last 168 hours. No results for input(s): "AMMONIA" in the last 168 hours. CBC: Recent Labs  Lab 06/17/22 1922 06/17/22 1947 06/18/22 0446 06/19/22 0233  WBC 4.9  --  3.2* 8.1  NEUTROABS 3.0  --   --   --   HGB 15.9* 17.7* 14.7 14.6  HCT 50.3* 52.0* 47.4* 44.2  MCV 93.7  --  95.8 90.9  PLT 153  --  154 159   Cardiac Enzymes: No results for input(s): "CKTOTAL", "CKMB", "CKMBINDEX", "TROPONINI" in the last 168 hours. BNP: BNP (last 3 results) Recent Labs    04/09/22 0700 04/25/22 1244 06/17/22 1922  BNP 2,367.1* 1,232.9* 862.1*    ProBNP (last 3 results) Recent Labs    07/19/21 0945  PROBNP 3,162*    CBG: Recent Labs  Lab 06/19/22 1605  06/19/22 1910 06/19/22 2323 06/20/22 0354 06/20/22 0722  GLUCAP 194* 95 161* 112* 97       Signed:  Domenic Polite MD.  [facility] 06/20/2022, 11:07 AM

## 2022-06-20 NOTE — TOC Initial Note (Signed)
Transition of Care [facility]) - Initial/Assessment Note    Patient Details  Name: Brittany [name] MRN: [id number] Date of Birth: [birth year]  Transition of Care [facility]) CM/SW Contact:    Erenest Rasher, RN Phone Number: [phone] 06/20/2022, 5:29 PM  Clinical Narrative:                  HF TOC CM spoke to pt and pt states she has only a portable tank that she uses as needed. Last admission pt oxygen levels dropped when ambulating. States she usually has shortness of breath on exertion. Completed qualifying saturation with walking pt down the hall, she was able to walk but as she continued to walk sat dropped down to 87%. Notified attending and oxygen ordered. [place] with order for home oxygen. Will deliver portable to room and concentrator to her home.   Pt's SO will provide transportation home.    Expected Discharge Plan: Home/Self Care Barriers to Discharge: No Barriers Identified   Patient Goals and CMS Choice Patient states their goals for this hospitalization and ongoing recovery are:: wants to remain independent CMS Medicare.gov Compare Post Acute Care list provided to:: Patient Choice offered to / list presented to : NA  Expected Discharge Plan and Services Expected Discharge Plan: Home/Self Care In-house Referral: NA   Post Acute Care Choice: NA Living arrangements for the past 2 months: Hotel/Motel Expected Discharge Date: 06/20/22               DME Arranged: Oxygen DME Agency: [employer] Date DME Agency Contacted: 06/20/22 Time DME Agency Contacted: 59 Representative spoke with at DME Agency: [place] Arranged: NA          Prior Living Arrangements/Services Living arrangements for the past 2 months: Hotel/Motel Lives with:: Self Patient language and need for interpreter reviewed:: Yes Do you feel safe going back to the place where you live?: Yes      Need for Family Participation in Patient Care: No (Comment) Care giver  support system in place?: No (comment) Current home services: DME (rollator) Criminal Activity/Legal Involvement Pertinent to Current Situation/Hospitalization: No - Comment as needed  Activities of Daily Living      Permission Sought/Granted Permission sought to share information with : Case Manager, Family Supports, PCP Permission granted to share information with : Yes, Verbal Permission Granted  Share Information with NAME: Anice Paganini     Permission granted to share info w Relationship: significant other  Permission granted to share info w Contact Information: [phone]  Emotional Assessment Appearance:: Appears stated age Attitude/Demeanor/Rapport: Engaged Affect (typically observed): Accepting Orientation: : Oriented to Self, Oriented to Place, Oriented to  Time, Oriented to Situation Alcohol / Substance Use: Not Applicable Psych Involvement: No (comment)  Admission diagnosis:  Shortness of breath [R06.02] COPD exacerbation (HCC) [J44.1] Chronic obstructive pulmonary disease with acute exacerbation (HCC) [J44.1] ICD (implantable cardioverter-defibrillator) in place [Z95.810] Patient Active Problem List   Diagnosis Date Noted   Malnutrition of moderate degree 06/20/2022   Lactic acidosis 04/12/2022   Prolonged QT interval 04/09/2022   Hypotension 04/09/2022   Chronic HFrEF (heart failure with reduced ejection fraction) ([place]) 12/11/2021   CHF exacerbation ([place]) 12/10/2021   Renal infarct ([place]) 12/10/2021   COPD exacerbation ([place]) 09/29/2021   Mixed diabetic hyperlipidemia associated with type 2 diabetes mellitus ([place]) 09/29/2021   CKD (chronic kidney disease) stage 3, GFR 30-59 ml/min ([place]) 09/29/2021   Unstable angina ([place]) 02/09/2021   Hemoptysis 10/20/2020   Type  2 diabetes mellitus with stage 3a chronic kidney disease, without long-term current use of insulin ([place]) 10/20/2020   Acute on chronic combined systolic and diastolic CHF (congestive heart failure)  ([place]) 05/21/2020   Ischemic cardiomyopathy 04/12/2020   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   NSTEMI (non-ST elevated myocardial infarction) ([place]) 02/12/2019   CHF (congestive heart failure) ([place]) 11/24/2018   Recurrent pleural effusion on right 05/05/2018   Acute on chronic respiratory failure with hypoxia ([place]) 05/05/2018   Chronic respiratory failure with hypoxia (HCC)    S/P CABG (coronary artery bypass graft) 02/03/2018   Asthma    Anxiety    Allergy    HCAP (healthcare-associated pneumonia) 03/15/2015   Chest pain 03/15/2015   Small cell lung cancer ([place]) 03/16/2014   Protein-calorie malnutrition, severe ([place]) 03/14/2014   AF (paroxysmal atrial fibrillation) ([place]) 03/12/2014   Essential hypertension 05/07/2012   PCP:  Traci Sermon, PA-C Pharmacy:   [id number] [place], [place] [address] Phone: [phone] Fax: [phone]  [facility] [address] [place] [address] Phone: [phone] Fax: [phone]     Social Determinants of Health (SDOH) Interventions    Readmission Risk Interventions    06/20/2022   10:00 AM 04/11/2021   10:16 AM  Readmission Risk Prevention Plan  Transportation Screening Complete Complete  PCP or Specialist Appt within 3-5 Days Complete Complete  HRI or Home Care Consult Complete Complete  Social Work Consult for [place] Planning/Counseling Complete Complete  Palliative Care Screening Not Applicable Not Applicable  Medication Review Press photographer) Complete Complete

## 2022-06-21 ENCOUNTER — Ambulatory Visit (INDEPENDENT_AMBULATORY_CARE_PROVIDER_SITE_OTHER): Payer: BC Managed Care – PPO

## 2022-06-21 DIAGNOSIS — I255 Ischemic cardiomyopathy: Secondary | ICD-10-CM

## 2022-06-23 LAB — CUP PACEART REMOTE DEVICE CHECK
Battery Remaining Longevity: 103 mo
Battery Voltage: 3.01 V
Brady Statistic RV Percent Paced: 0.01 %
Date Time Interrogation Session: 20230713114953
HighPow Impedance: 69 Ohm
Implantable Lead Implant Date: 20190905
Implantable Lead Location: 753860
Implantable Pulse Generator Implant Date: 20190905
Lead Channel Impedance Value: 380 Ohm
Lead Channel Impedance Value: 456 Ohm
Lead Channel Pacing Threshold Amplitude: 0.625 V
Lead Channel Pacing Threshold Pulse Width: 0.4 ms
Lead Channel Sensing Intrinsic Amplitude: 17.625 mV
Lead Channel Sensing Intrinsic Amplitude: 17.625 mV
Lead Channel Setting Pacing Amplitude: 2 V
Lead Channel Setting Pacing Pulse Width: 0.4 ms
Lead Channel Setting Sensing Sensitivity: 0.3 mV

## 2022-06-26 ENCOUNTER — Ambulatory Visit (INDEPENDENT_AMBULATORY_CARE_PROVIDER_SITE_OTHER): Payer: BC Managed Care – PPO

## 2022-06-26 DIAGNOSIS — Z95810 Presence of automatic (implantable) cardiac defibrillator: Secondary | ICD-10-CM | POA: Diagnosis not present

## 2022-06-26 DIAGNOSIS — I5022 Chronic systolic (congestive) heart failure: Secondary | ICD-10-CM

## 2022-06-28 NOTE — Progress Notes (Signed)
EPIC Encounter for ICM Monitoring  Patient Name: Brittany [name] is a 61 y.o. female Date: 06/28/2022 Primary Care Physican: Traci Sermon, PA-C Primary Cardiologist: [place] Electrophysiologist: [place] 03/24/2022 Weight: 101 lbs 04/19/2022 Weight: 101 lbs 05/16/2022 Weight: 101 lbs 05/24/2022 Weight: 101 lbs   Spoke with patient and heart failure questions reviewed.  Pt reports she had hospitalization for COPD.    Optivol Thoracic impedance suggesting normal fluid levels.   Barostim implant.   Prescribed: Torsemide 20 mg takes 2 tablet by mouth once a day and 1 extra tablet if weight increases 3-5 pounds in one day. Potassium 20 mEq take 2 tablets (40 mEq total) by mouth daily Metolazone 2.5 mg take by mouth as needed for fluid  Spironolactone 25 mg take 1 tablet (25 mg total) by mouth daily Farxiga 10 mg take 1 tablet daily   Labs: 06/20/2022 Creatinine 1.56, BUN 56, Potassium 4.1, Sodium 136, GFR 38 06/19/2022 Creatinine 1.55, BUN 46, Potassium 4.3, Sodium 134, GFR 38  06/18/2022 Creatinine 1.62, BUN 33, Potassium 3.4, Sodium 135, GFR 36  06/17/2022 Creatinine 1.55, BUN 26, Potassium 3.6, Sodium 138, GFR 38  05/24/2022 Creatinine 1.15, BUN 18, Potassium 4.3, Sodium 138, GFR 54 05/17/2022 Creatinine 1.46, BUN 33, Potassium 2.9, Sodium 137, GFR 41 A complete set of results can be found in Results Review.   Recommendations:  No changes and encouraged to call if experiencing any fluid symptoms.    Follow-up plan: ICM clinic phone appointment on 07/31/2022.   91 day device clinic remote transmission 09/20/2022.     EP/Cardiology Office Visits:    07/24/2022 with Dr [name].   Copy of ICM check sent to Dr. Rayann Heman.   3 month ICM trend: 06/26/2022.    12-14 Month ICM trend:     [name], RN 06/28/2022 4:58 PM

## 2022-07-09 ENCOUNTER — Emergency Department (HOSPITAL_COMMUNITY): Payer: BC Managed Care – PPO

## 2022-07-09 ENCOUNTER — Inpatient Hospital Stay (HOSPITAL_COMMUNITY): Payer: BC Managed Care – PPO

## 2022-07-09 ENCOUNTER — Inpatient Hospital Stay (HOSPITAL_COMMUNITY)
Admission: AD | Admit: 2022-07-09 | Discharge: 2022-07-12 | DRG: 291 | Disposition: A | Discharge status: Home / Self Care | Payer: BC Managed Care – PPO | Attending: Cardiology | Admitting: Cardiology

## 2022-07-09 ENCOUNTER — Other Ambulatory Visit: Payer: Self-pay

## 2022-07-09 ENCOUNTER — Emergency Department: Payer: Self-pay

## 2022-07-09 ENCOUNTER — Encounter (HOSPITAL_COMMUNITY): Payer: Self-pay

## 2022-07-09 DIAGNOSIS — I480 Paroxysmal atrial fibrillation: Secondary | ICD-10-CM | POA: Diagnosis present

## 2022-07-09 DIAGNOSIS — Z7984 Long term (current) use of oral hypoglycemic drugs: Secondary | ICD-10-CM | POA: Diagnosis not present

## 2022-07-09 DIAGNOSIS — Z20822 Contact with and (suspected) exposure to covid-19: Secondary | ICD-10-CM | POA: Diagnosis present

## 2022-07-09 DIAGNOSIS — Z599 Problem related to housing and economic circumstances, unspecified: Secondary | ICD-10-CM | POA: Diagnosis not present

## 2022-07-09 DIAGNOSIS — J9621 Acute and chronic respiratory failure with hypoxia: Secondary | ICD-10-CM | POA: Diagnosis present

## 2022-07-09 DIAGNOSIS — I5021 Acute systolic (congestive) heart failure: Secondary | ICD-10-CM | POA: Diagnosis not present

## 2022-07-09 DIAGNOSIS — I5043 Acute on chronic combined systolic (congestive) and diastolic (congestive) heart failure: Secondary | ICD-10-CM | POA: Diagnosis present

## 2022-07-09 DIAGNOSIS — I5041 Acute combined systolic (congestive) and diastolic (congestive) heart failure: Secondary | ICD-10-CM | POA: Diagnosis present

## 2022-07-09 DIAGNOSIS — Z951 Presence of aortocoronary bypass graft: Secondary | ICD-10-CM | POA: Diagnosis not present

## 2022-07-09 DIAGNOSIS — Z833 Family history of diabetes mellitus: Secondary | ICD-10-CM

## 2022-07-09 DIAGNOSIS — I5023 Acute on chronic systolic (congestive) heart failure: Secondary | ICD-10-CM | POA: Diagnosis not present

## 2022-07-09 DIAGNOSIS — I130 Hypertensive heart and chronic kidney disease with heart failure and stage 1 through stage 4 chronic kidney disease, or unspecified chronic kidney disease: Principal | ICD-10-CM | POA: Diagnosis present

## 2022-07-09 DIAGNOSIS — J439 Emphysema, unspecified: Secondary | ICD-10-CM | POA: Diagnosis present

## 2022-07-09 DIAGNOSIS — I252 Old myocardial infarction: Secondary | ICD-10-CM | POA: Diagnosis not present

## 2022-07-09 DIAGNOSIS — Z7901 Long term (current) use of anticoagulants: Secondary | ICD-10-CM

## 2022-07-09 DIAGNOSIS — N1831 Chronic kidney disease, stage 3a: Secondary | ICD-10-CM | POA: Diagnosis present

## 2022-07-09 DIAGNOSIS — Z87891 Personal history of nicotine dependence: Secondary | ICD-10-CM | POA: Diagnosis not present

## 2022-07-09 DIAGNOSIS — Z95810 Presence of automatic (implantable) cardiac defibrillator: Secondary | ICD-10-CM

## 2022-07-09 DIAGNOSIS — I2510 Atherosclerotic heart disease of native coronary artery without angina pectoris: Secondary | ICD-10-CM | POA: Diagnosis present

## 2022-07-09 DIAGNOSIS — Z5941 Food insecurity: Secondary | ICD-10-CM

## 2022-07-09 DIAGNOSIS — Z9221 Personal history of antineoplastic chemotherapy: Secondary | ICD-10-CM

## 2022-07-09 DIAGNOSIS — E872 Acidosis, unspecified: Secondary | ICD-10-CM | POA: Diagnosis present

## 2022-07-09 DIAGNOSIS — E1165 Type 2 diabetes mellitus with hyperglycemia: Secondary | ICD-10-CM | POA: Diagnosis present

## 2022-07-09 DIAGNOSIS — Z79899 Other long term (current) drug therapy: Secondary | ICD-10-CM | POA: Diagnosis not present

## 2022-07-09 DIAGNOSIS — Z85118 Personal history of other malignant neoplasm of bronchus and lung: Secondary | ICD-10-CM

## 2022-07-09 DIAGNOSIS — Z955 Presence of coronary angioplasty implant and graft: Secondary | ICD-10-CM | POA: Diagnosis not present

## 2022-07-09 DIAGNOSIS — R69 Illness, unspecified: Secondary | ICD-10-CM

## 2022-07-09 DIAGNOSIS — I255 Ischemic cardiomyopathy: Secondary | ICD-10-CM | POA: Diagnosis present

## 2022-07-09 DIAGNOSIS — Z923 Personal history of irradiation: Secondary | ICD-10-CM

## 2022-07-09 DIAGNOSIS — Z885 Allergy status to narcotic agent status: Secondary | ICD-10-CM | POA: Diagnosis not present

## 2022-07-09 DIAGNOSIS — Z8249 Family history of ischemic heart disease and other diseases of the circulatory system: Secondary | ICD-10-CM

## 2022-07-09 DIAGNOSIS — Z5982 Transportation insecurity: Secondary | ICD-10-CM

## 2022-07-09 DIAGNOSIS — J9620 Acute and chronic respiratory failure, unspecified whether with hypoxia or hypercapnia: Principal | ICD-10-CM

## 2022-07-09 LAB — CBC WITH DIFFERENTIAL/PLATELET
Abs Immature Granulocytes: 0.09 10*3/uL — ABNORMAL HIGH (ref 0.00–0.07)
Basophils Absolute: 0.1 10*3/uL (ref 0.0–0.1)
Basophils Relative: 1 %
Eosinophils Absolute: 0.4 10*3/uL (ref 0.0–0.5)
Eosinophils Relative: 3 %
HCT: 45.3 % (ref 36.0–46.0)
Hemoglobin: 14 g/dL (ref 12.0–15.0)
Immature Granulocytes: 1 %
Lymphocytes Relative: 21 %
Lymphs Abs: 2.7 10*3/uL (ref 0.7–4.0)
MCH: 29.9 pg (ref 26.0–34.0)
MCHC: 30.9 g/dL (ref 30.0–36.0)
MCV: 96.8 fL (ref 80.0–100.0)
Monocytes Absolute: 1 10*3/uL (ref 0.1–1.0)
Monocytes Relative: 8 %
Neutro Abs: 8.8 10*3/uL — ABNORMAL HIGH (ref 1.7–7.7)
Neutrophils Relative %: 66 %
Platelets: 186 10*3/uL (ref 150–400)
RBC: 4.68 MIL/uL (ref 3.87–5.11)
RDW: 19.9 % — ABNORMAL HIGH (ref 11.5–15.5)
WBC: 13.1 10*3/uL — ABNORMAL HIGH (ref 4.0–10.5)
nRBC: 0 % (ref 0.0–0.2)

## 2022-07-09 LAB — URINALYSIS, ROUTINE W REFLEX MICROSCOPIC
Bilirubin Urine: NEGATIVE
Glucose, UA: 50 mg/dL — AB
Ketones, ur: NEGATIVE mg/dL
Nitrite: NEGATIVE
Protein, ur: NEGATIVE mg/dL
Specific Gravity, Urine: 1.009 (ref 1.005–1.030)
pH: 5 (ref 5.0–8.0)

## 2022-07-09 LAB — I-STAT VENOUS BLOOD GAS, ED
Acid-base deficit: 6 mmol/L — ABNORMAL HIGH (ref 0.0–2.0)
Bicarbonate: 22.9 mmol/L (ref 20.0–28.0)
Calcium, Ion: 1.19 mmol/L (ref 1.15–1.40)
HCT: 45 % (ref 36.0–46.0)
Hemoglobin: 15.3 g/dL — ABNORMAL HIGH (ref 12.0–15.0)
O2 Saturation: 79 %
Potassium: 4.7 mmol/L (ref 3.5–5.1)
Sodium: 140 mmol/L (ref 135–145)
TCO2: 25 mmol/L (ref 22–32)
pCO2, Ven: 55.2 mmHg (ref 44–60)
pH, Ven: 7.226 — ABNORMAL LOW (ref 7.25–7.43)
pO2, Ven: 52 mmHg — ABNORMAL HIGH (ref 32–45)

## 2022-07-09 LAB — COMPREHENSIVE METABOLIC PANEL
ALT: 14 U/L (ref 0–44)
AST: 27 U/L (ref 15–41)
Albumin: 3.6 g/dL (ref 3.5–5.0)
Alkaline Phosphatase: 75 U/L (ref 38–126)
Anion gap: 8 (ref 5–15)
BUN: 10 mg/dL (ref 8–23)
CO2: 19 mmol/L — ABNORMAL LOW (ref 22–32)
Calcium: 9.2 mg/dL (ref 8.9–10.3)
Chloride: 111 mmol/L (ref 98–111)
Creatinine, Ser: 1.26 mg/dL — ABNORMAL HIGH (ref 0.44–1.00)
GFR, Estimated: 49 mL/min — ABNORMAL LOW (ref >60)
Glucose, Bld: 271 mg/dL — ABNORMAL HIGH (ref 70–99)
Potassium: 4.6 mmol/L (ref 3.5–5.1)
Sodium: 138 mmol/L (ref 135–145)
Total Bilirubin: 1 mg/dL (ref 0.3–1.2)
Total Protein: 7.3 g/dL (ref 6.5–8.1)

## 2022-07-09 LAB — I-STAT CHEM 8, ED
BUN: 15 mg/dL (ref 8–23)
Calcium, Ion: 1.16 mmol/L (ref 1.15–1.40)
Chloride: 110 mmol/L (ref 98–111)
Creatinine, Ser: 1.1 mg/dL — ABNORMAL HIGH (ref 0.44–1.00)
Glucose, Bld: 253 mg/dL — ABNORMAL HIGH (ref 70–99)
HCT: 45 % (ref 36.0–46.0)
Hemoglobin: 15.3 g/dL — ABNORMAL HIGH (ref 12.0–15.0)
Potassium: 4.7 mmol/L (ref 3.5–5.1)
Sodium: 140 mmol/L (ref 135–145)
TCO2: 23 mmol/L (ref 22–32)

## 2022-07-09 LAB — RESP PANEL BY RT-PCR (FLU A&B, COVID) ARPGX2
Influenza A by PCR: NEGATIVE
Influenza B by PCR: NEGATIVE
SARS Coronavirus 2 by RT PCR: NEGATIVE

## 2022-07-09 LAB — PROTIME-INR
INR: 1.1 (ref 0.8–1.2)
Prothrombin Time: 14.3 seconds (ref 11.4–15.2)

## 2022-07-09 LAB — GLUCOSE, CAPILLARY
Glucose-Capillary: 252 mg/dL — ABNORMAL HIGH (ref 70–99)
Glucose-Capillary: 302 mg/dL — ABNORMAL HIGH (ref 70–99)

## 2022-07-09 LAB — TROPONIN I (HIGH SENSITIVITY)
Troponin I (High Sensitivity): 37 ng/L — ABNORMAL HIGH (ref <18)
Troponin I (High Sensitivity): 70 ng/L — ABNORMAL HIGH (ref <18)

## 2022-07-09 LAB — LACTIC ACID, PLASMA
Lactic Acid, Venous: 3.6 mmol/L (ref 0.5–1.9)
Lactic Acid, Venous: 4.8 mmol/L (ref 0.5–1.9)

## 2022-07-09 LAB — BRAIN NATRIURETIC PEPTIDE: B Natriuretic Peptide: 2223.4 pg/mL — ABNORMAL HIGH (ref 0.0–100.0)

## 2022-07-09 LAB — CBG MONITORING, ED: Glucose-Capillary: 175 mg/dL — ABNORMAL HIGH (ref 70–99)

## 2022-07-09 MED ORDER — POTASSIUM CHLORIDE CRYS ER 20 MEQ PO TBCR
20.0000 meq | EXTENDED_RELEASE_TABLET | Freq: Every day | ORAL | Status: DC
  Administered 2022-07-09 – 2022-07-11 (×3): 20 meq via ORAL
  Filled 2022-07-09 (×3): qty 1

## 2022-07-09 MED ORDER — ACETAMINOPHEN 325 MG PO TABS
650.0000 mg | ORAL_TABLET | ORAL | Status: DC | PRN
  Administered 2022-07-10: 650 mg via ORAL
  Filled 2022-07-09: qty 2

## 2022-07-09 MED ORDER — DAPAGLIFLOZIN PROPANEDIOL 10 MG PO TABS
10.0000 mg | ORAL_TABLET | Freq: Every day | ORAL | Status: DC
  Administered 2022-07-10 – 2022-07-12 (×3): 10 mg via ORAL
  Filled 2022-07-09 (×4): qty 1

## 2022-07-09 MED ORDER — ONDANSETRON HCL 4 MG/2ML IJ SOLN: 4.0000 mg | Freq: Four times a day (QID) | INTRAMUSCULAR | Status: DC | PRN

## 2022-07-09 MED ORDER — SODIUM CHLORIDE 0.9% FLUSH
10.0000 mL | Freq: Two times a day (BID) | INTRAVENOUS | Status: DC
  Administered 2022-07-09 – 2022-07-12 (×5): 10 mL

## 2022-07-09 MED ORDER — ALBUTEROL (5 MG/ML) CONTINUOUS INHALATION SOLN
10.0000 mg/h | INHALATION_SOLUTION | Freq: Once | RESPIRATORY_TRACT | Status: AC
  Administered 2022-07-09: 10 mg/h via RESPIRATORY_TRACT
  Filled 2022-07-09: qty 0.5

## 2022-07-09 MED ORDER — NAPHAZOLINE-PHENIRAMINE 0.025-0.3 % OP SOLN
1.0000 [drp] | Freq: Four times a day (QID) | OPHTHALMIC | Status: DC | PRN
  Filled 2022-07-09: qty 15

## 2022-07-09 MED ORDER — ADULT MULTIVITAMIN W/MINERALS CH
1.0000 | ORAL_TABLET | Freq: Every day | ORAL | Status: DC
  Administered 2022-07-09 – 2022-07-10 (×2): 1 via ORAL
  Filled 2022-07-09 (×3): qty 1

## 2022-07-09 MED ORDER — ALPRAZOLAM 0.25 MG PO TABS
0.2500 mg | ORAL_TABLET | Freq: Two times a day (BID) | ORAL | Status: DC | PRN
  Administered 2022-07-09 – 2022-07-11 (×3): 0.25 mg via ORAL
  Filled 2022-07-09 (×3): qty 1

## 2022-07-09 MED ORDER — APIXABAN 2.5 MG PO TABS
2.5000 mg | ORAL_TABLET | Freq: Two times a day (BID) | ORAL | Status: DC
  Administered 2022-07-09 – 2022-07-12 (×6): 2.5 mg via ORAL
  Filled 2022-07-09 (×6): qty 1

## 2022-07-09 MED ORDER — ALBUTEROL SULFATE HFA 108 (90 BASE) MCG/ACT IN AERS: 2.0000 | INHALATION_SPRAY | Freq: Four times a day (QID) | RESPIRATORY_TRACT | Status: DC | PRN

## 2022-07-09 MED ORDER — INSULIN ASPART 100 UNIT/ML IJ SOLN
0.0000 [IU] | Freq: Three times a day (TID) | INTRAMUSCULAR | Status: DC
  Administered 2022-07-09: 11 [IU] via SUBCUTANEOUS
  Administered 2022-07-10 (×2): 2 [IU] via SUBCUTANEOUS
  Administered 2022-07-11: 8 [IU] via SUBCUTANEOUS
  Administered 2022-07-11 – 2022-07-12 (×2): 5 [IU] via SUBCUTANEOUS

## 2022-07-09 MED ORDER — ALBUTEROL SULFATE (2.5 MG/3ML) 0.083% IN NEBU
2.5000 mg | INHALATION_SOLUTION | Freq: Four times a day (QID) | RESPIRATORY_TRACT | Status: DC | PRN
Start: 2022-07-09 — End: 2022-07-12
  Administered 2022-07-09 – 2022-07-11 (×2): 2.5 mg via RESPIRATORY_TRACT
  Filled 2022-07-09 (×2): qty 3

## 2022-07-09 MED ORDER — IVABRADINE HCL 7.5 MG PO TABS
7.5000 mg | ORAL_TABLET | Freq: Two times a day (BID) | ORAL | Status: DC
  Administered 2022-07-09 – 2022-07-12 (×6): 7.5 mg via ORAL
  Filled 2022-07-09 (×8): qty 1

## 2022-07-09 MED ORDER — MILRINONE LACTATE IN DEXTROSE 20-5 MG/100ML-% IV SOLN
0.2500 ug/kg/min | INTRAVENOUS | Status: DC
  Administered 2022-07-09 – 2022-07-10 (×2): 0.25 ug/kg/min via INTRAVENOUS
  Filled 2022-07-09 (×2): qty 100

## 2022-07-09 MED ORDER — FUROSEMIDE 10 MG/ML IJ SOLN
60.0000 mg | Freq: Once | INTRAMUSCULAR | Status: AC
  Administered 2022-07-09: 60 mg via INTRAVENOUS
  Filled 2022-07-09: qty 6

## 2022-07-09 MED ORDER — ZOLPIDEM TARTRATE 5 MG PO TABS
5.0000 mg | ORAL_TABLET | Freq: Every evening | ORAL | Status: DC | PRN
  Filled 2022-07-09: qty 1

## 2022-07-09 MED ORDER — NITROGLYCERIN 0.4 MG SL SUBL: 0.4000 mg | SUBLINGUAL_TABLET | SUBLINGUAL | Status: DC | PRN

## 2022-07-09 MED ORDER — IPRATROPIUM BROMIDE 0.02 % IN SOLN
0.5000 mg | Freq: Once | RESPIRATORY_TRACT | Status: AC
  Administered 2022-07-09: 0.5 mg via RESPIRATORY_TRACT

## 2022-07-09 MED ORDER — IPRATROPIUM-ALBUTEROL 0.5-2.5 (3) MG/3ML IN SOLN: 3.0000 mL | RESPIRATORY_TRACT | Status: DC | PRN

## 2022-07-09 MED ORDER — CHLORHEXIDINE GLUCONATE CLOTH 2 % EX PADS
6.0000 | MEDICATED_PAD | Freq: Every day | CUTANEOUS | Status: DC
  Administered 2022-07-09 – 2022-07-12 (×4): 6 via TOPICAL

## 2022-07-09 MED ORDER — INSULIN ASPART 100 UNIT/ML IJ SOLN
0.0000 [IU] | Freq: Three times a day (TID) | INTRAMUSCULAR | Status: DC
  Administered 2022-07-09: 8 [IU] via SUBCUTANEOUS

## 2022-07-09 MED ORDER — SODIUM CHLORIDE 0.9% FLUSH: 10.0000 mL | INTRAVENOUS | Status: DC | PRN

## 2022-07-09 MED ORDER — ATORVASTATIN CALCIUM 80 MG PO TABS
80.0000 mg | ORAL_TABLET | Freq: Every day | ORAL | Status: DC
  Administered 2022-07-09 – 2022-07-11 (×3): 80 mg via ORAL
  Filled 2022-07-09 (×3): qty 1

## 2022-07-09 MED ORDER — SPIRONOLACTONE 25 MG PO TABS
25.0000 mg | ORAL_TABLET | Freq: Every day | ORAL | Status: DC
  Administered 2022-07-09 – 2022-07-12 (×4): 25 mg via ORAL
  Filled 2022-07-09 (×4): qty 1

## 2022-07-09 NOTE — ED Notes (Signed)
Pt tolerating [place].  No increased worth of breathing.

## 2022-07-09 NOTE — ED Notes (Signed)
Pt placed back on bipap per request.

## 2022-07-09 NOTE — ED Triage Notes (Signed)
[facility] reports pt coming from home  c/o sob since yesterday but getting worse this morning. EMS gave 10mg  albuterol and 0.5mg  of Atrovent and 125mg   Solumedrol. Pt on CPAP upon arrival.

## 2022-07-09 NOTE — H&P (Addendum)
Cardiology H&P:   Patient ID: Brittany [name] MRN: [id number]; DOB: 1961-10-04  Admit date: 07/09/2022 Date of Consult: 07/09/2022  PCP:  Traci Sermon, PA-C   [facility] Providers Cardiologist:  None  Electrophysiologist:  Thompson Grayer, MD  Advanced Heart Failure:  Glori Bickers, MD       Patient Profile:   Ciclaly Mulcahey is a 61 y.o. 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  History of Present Illness:   [name] was admitted 02/03/2018 with NSTEMI and shock. Underwent emergent cath 02/03/18 showed LAD 100% stenosed, LCx 95% stenosed. Taken for emergent CABG 02/03/18. Required impella post op. Hospital course complicated by cardiogenic shock, HCAP, A fib, respiratory failure, and swallowing issues. She was discharged to SNF. Discharge weight 103 pounds.    In 2019 had multiple hospitalizations for HF and pleural effusion. Underwent pleurodesis at [facility]. S/p MDT ICD VISIA AF MRI   Admitted 3/20 with NSTEMI and HF. Received DES to ostial ramus into distal left main based on cath below and underwent diuresis. Meds adjusted as tolerated. Echo with EF 25-30%.    Admitted 11/21 with COPD flare with mild HF and hemoptysis. CTA showed stable scarring in the right hilum consistent with emphysema.. Underwent bronchoscopy on 11/15, no obvious source of bleeding found on examination.    Echo 11/21 EF 20-25%. RV mildly HK.    Admitted 3/22 with chest pain and shortness of breath. HS-Trop with no trend. Echo with EF < 20%. Treated for AECOPD exacerbation. Breathing improved. Discharged on torsemide 60 mg daily.   Admitted 4/22 with ADHF. Diuresed with IV lasix and transitioned back to  torsemide 80 mg daily x 4 days then down to torsemide 60 mg daily.    Admitted 12/22 with R renal infarct felt to be cardio-embolic (no AF found). Started on Eliquis   Was seen in ER 4/23 for worsening SOB. CXR ok. Given 1 dose of IV lasix and nebs.    Follow up 03/24/22, breathing improved, fluid OK on OptiVol and exam, weight 101 lbs.   Admitted 5/23 with a/c CHF and COPD. Diuresed with IV lasix. Treated with doxy, pred and nebs for COPD exacerbation. Underwent Barostim 04/14/22. Discharged home, weight 100 lbs.  Seen in office 05/17/2022 and doing well, no med changes, wt 99 lbs.  Pt came to ER today w/ SOB, Cards asked to see.   Ms. Davis Gourd says that she has been getting more short of breath for about the last 3 days.  She admits that she may have gotten some extra hidden salt and some of the food she ate.  She has been compliant with medications.  She has had no significant weight change, but dyspnea on exertion has been getting worse.  She describes orthopnea and last night had PND.  That is what made her come to the hospital.  In the ER, she has been placed on BiPAP and has been given IV Lasix.  She has diuresed close to a liter and is breathing a little bit better.  Before her acute decompensation, she felt she was improving after the Barostim.   Past Medical History:  Diagnosis Date   Acute respiratory failure ([place]) 24/23/5361   Acute systolic congestive  heart failure ([place]) 02/03/2018   AICD (automatic cardioverter/defibrillator) present    Allergy    Anxiety    Asthma    DM2 (diabetes mellitus, type 2) ([place]) 10/20/2020   Hypertension    PAF (paroxysmal atrial fibrillation) (HCC)    Presence of permanent cardiac pacemaker    Prophylactic measure 08/03/14-08/19/14   Prophyl. 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   LIMA to LAD, SVG to D1, SVG to OM1, EVH via right thigh with implantation of Impella LD LVAD via direct aortic approach   Small cell lung  cancer ([place]) 03/16/2014    Past Surgical History:  Procedure Laterality Date   BRONCHIAL BRUSHINGS  10/25/2020   Procedure: BRONCHIAL BRUSHINGS;  Surgeon: Collene Gobble, MD;  Location: [facility] ENDOSCOPY;  Service: Pulmonary;;   BRONCHIAL NEEDLE ASPIRATION BIOPSY  10/25/2020   Procedure: BRONCHIAL NEEDLE ASPIRATION BIOPSIES;  Surgeon: Collene Gobble, MD;  Location: [place] ENDOSCOPY;  Service: Pulmonary;;   CARDIAC DEFIBRILLATOR PLACEMENT  08/15/2018   MDT Visia AF MRI VR ICD implanted by Dr Loel Lofty for primary prevention of sudden   CESAREAN SECTION     CORONARY ARTERY BYPASS GRAFT N/A 02/03/2018   Procedure: CORONARY ARTERY BYPASS GRAFTING (CABG);  Surgeon: Rexene Alberts, MD;  Location: [place];  Service: Open Heart Surgery;  Laterality: N/A;  Time 3 using left internal mammary artery and endoscopically harvested right saphenous vein   CORONARY BALLOON ANGIOPLASTY N/A 02/03/2018   Procedure: CORONARY BALLOON ANGIOPLASTY;  Surgeon: Martinique, Peter M, MD;  Location: [place] CV LAB;  Service: Cardiovascular;  Laterality: N/A;   CORONARY STENT INTERVENTION N/A 02/12/2019   Procedure: CORONARY STENT INTERVENTION;  Surgeon: Wellington Hampshire, MD;  Location: [place] CV LAB;  Service: Cardiovascular;  Laterality: N/A;   CORONARY/GRAFT ACUTE MI REVASCULARIZATION N/A 02/03/2018   Procedure: Coronary/Graft Acute MI Revascularization;  Surgeon: Martinique, Peter M, MD;  Location: [place] CV LAB;  Service: Cardiovascular;  Laterality: N/A;   ENDOBRONCHIAL ULTRASOUND N/A 10/25/2020   Procedure: ENDOBRONCHIAL ULTRASOUND;  Surgeon: Collene Gobble, MD;  Location: [facility] ENDOSCOPY;  Service: Pulmonary;  Laterality: N/A;   FLEXIBLE BRONCHOSCOPY  10/25/2020   Procedure: FLEXIBLE BRONCHOSCOPY;  Surgeon: Collene Gobble, MD;  Location: [facility] ENDOSCOPY;  Service: Pulmonary;;   IABP INSERTION N/A 02/03/2018   Procedure: IABP Insertion;  Surgeon: Martinique, Peter M, MD;  Location: [place] CV LAB;  Service: Cardiovascular;   Laterality: N/A;   INTRAOPERATIVE TRANSESOPHAGEAL ECHOCARDIOGRAM N/A 02/03/2018   Procedure: INTRAOPERATIVE TRANSESOPHAGEAL ECHOCARDIOGRAM;  Surgeon: Rexene Alberts, MD;  Location: [place];  Service: Open Heart Surgery;  Laterality: N/A;   LEFT HEART CATH AND CORONARY ANGIOGRAPHY N/A 02/03/2018   Procedure: LEFT HEART CATH AND CORONARY ANGIOGRAPHY;  Surgeon: Martinique, Peter M, MD;  Location: [place] CV LAB;  Service: Cardiovascular;  Laterality: N/A;   LEFT HEART CATH AND CORS/GRAFTS ANGIOGRAPHY N/A 02/12/2019   Procedure: LEFT HEART CATH AND CORS/GRAFTS ANGIOGRAPHY;  Surgeon: Wellington Hampshire, MD;  Location: [place] CV LAB;  Service: Cardiovascular;  Laterality: N/A;   MEDIASTINOSCOPY N/A 03/11/2014   Procedure: MEDIASTINOSCOPY;  Surgeon: Melrose Nakayama, MD;  Location: [place];  Service: Thoracic;  Laterality: N/A;   PLACEMENT OF IMPELLA LEFT VENTRICULAR ASSIST DEVICE  02/03/2018   Procedure: PLACEMENT OF [place] LEFT VENTRICULAR ASSIST DEVICE LD;  Surgeon: Rexene Alberts, MD;  Location: [place];  Service: Open Heart Surgery;;   REMOVAL OF IMPELLA LEFT VENTRICULAR ASSIST DEVICE N/A 02/08/2018  Procedure: REMOVAL OF [place] LEFT VENTRICULAR ASSIST DEVICE;  Surgeon: Rexene Alberts, MD;  Location: [place];  Service: Open Heart Surgery;  Laterality: N/A;   RIGHT HEART CATH N/A 02/03/2018   Procedure: RIGHT HEART CATH;  Surgeon: Martinique, Peter M, MD;  Location: [place] CV LAB;  Service: Cardiovascular;  Laterality: N/A;   RIGHT HEART CATH N/A 05/09/2018   Procedure: RIGHT HEART CATH;  Surgeon: Jolaine Artist, MD;  Location: [place] CV LAB;  Service: Cardiovascular;  Laterality: N/A;   TEE WITHOUT CARDIOVERSION N/A 02/08/2018   Procedure: TRANSESOPHAGEAL ECHOCARDIOGRAM (TEE);  Surgeon: Rexene Alberts, MD;  Location: [place];  Service: Open Heart Surgery;  Laterality: N/A;   TUBAL LIGATION     VIDEO BRONCHOSCOPY WITH ENDOBRONCHIAL ULTRASOUND N/A 03/11/2014   Procedure: VIDEO BRONCHOSCOPY WITH  ENDOBRONCHIAL ULTRASOUND;  Surgeon: Melrose Nakayama, MD;  Location: [place];  Service: Thoracic;  Laterality: N/A;     Home Medications:  Prior to Admission medications   Medication Sig Start Date End Date Taking? Authorizing Provider  albuterol (VENTOLIN HFA) 108 (90 Base) MCG/ACT inhaler Inhale 2 puffs into the lungs every 6 (six) hours as needed for wheezing or shortness of breath. 06/20/22   Domenic Polite, MD  apixaban (ELIQUIS) 2.5 MG TABS tablet Take 1 tablet (2.5 mg total) by mouth 2 (two) times daily. 06/20/22   Domenic Polite, MD  atorvastatin (LIPITOR) 80 MG tablet Take 1 tablet (80 mg total) by mouth daily at 6 PM. 04/16/22   Hongalgi, Lenis Dickinson, MD  blood glucose meter kit and supplies KIT Dispense based on patient and insurance preference. Use up to four times daily as directed. 04/11/21   Bonnielee Haff, MD  FARXIGA 10 MG TABS tablet TAKE 1 TABLET (10 MG TOTAL) BY MOUTH DAILY BEFORE BREAKFAST. Patient taking differently: 10 mg daily. 08/16/21   Bensimhon, Shaune Pascal, MD  Glucerna (GLUCERNA) LIQD Take 237 mLs by mouth See admin instructions. Takes 1 bottle twice a week    [provider]  ipratropium-albuterol (DUONEB) 0.5-2.5 (3) MG/3ML SOLN Take 3 mLs by nebulization every 4 (four) hours as needed (for shortness of breath). 06/20/22   Domenic Polite, MD  ivabradine (CORLANOR) 7.5 MG TABS tablet TAKE 1 TABLET (7.5 MG TOTAL) BY MOUTH 2 (TWO) TIMES DAILY WITH A MEAL. Patient taking differently: Take 7.5 mg by mouth 2 (two) times daily with a meal. TAKE 1 TABLET (7.5 MG TOTAL) BY MOUTH 2 (TWO) TIMES DAILY WITH A MEAL. 06/10/22   Reino Bellis B, NP  metolazone (ZAROXOLYN) 2.5 MG tablet Take 2.5 mg by mouth daily as needed (edema).    [provider]  Multiple Vitamin (MULTIVITAMIN WITH MINERALS) TABS tablet Take 1 tablet by mouth daily. 12/15/21   Hongalgi, Lenis Dickinson, MD  naphazoline-pheniramine (VISINE) 0.025-0.3 % ophthalmic solution Place 1 drop into both eyes 4 (four) times  daily as needed for eye irritation.    [provider]  nitroGLYCERIN (NITROSTAT) 0.4 MG SL tablet Place 1 tablet (0.4 mg total) under the tongue every 5 (five) minutes as needed for chest pain. Patient not taking: Reported on 06/18/2022 02/14/19   Shirley Friar, PA-C  potassium chloride SA (KLOR-CON M) 20 MEQ tablet Take 1 tablet (20 mEq total) by mouth daily. TAke extra 91mq when you take higher dose of torsemide or metolazone 06/20/22   JDomenic Polite MD  predniSONE (DELTASONE) 20 MG tablet Take 2 tablets (471m daily for 2 days then 1 tablet (2080mdaily for 2  days then STOP 06/21/22   Domenic Polite, MD  spironolactone (ALDACTONE) 25 MG tablet Take 1 tablet (25 mg total) by mouth daily. 05/19/22   [name], Maricela Bo, FNP  torsemide (DEMADEX) 20 MG tablet Take 2 tablets (40 mg total) by mouth daily. 06/20/22   Domenic Polite, MD    Inpatient Medications: Scheduled Meds:  Continuous Infusions:  PRN Meds:   Allergies:    Allergies  Allergen Reactions   Codeine Nausea And Vomiting    Social History:   Social History   Socioeconomic History   Marital status: Married    Spouse name: Michell Heinrich    Number of children: 1   Years of education: Not on file   Highest education level: Not on file  Occupational History   Not on file  Tobacco Use   Smoking status: Former    Packs/day: 1.00    Years: 20.00    Total pack years: 20.00    Types: Cigarettes    Quit date: 03/13/2014    Years since quitting: 8.3   Smokeless tobacco: Never  Vaping Use   Vaping Use: Never used  Substance and Sexual Activity   Alcohol use: Yes    Alcohol/week: 8.0 standard drinks of alcohol    Types: 8 Glasses of wine per week   Drug use: Yes    Types: Marijuana    Comment: "medicinal"   Sexual activity: Never  Other Topics Concern   Not on file  Social History Narrative   Not on file   Social Determinants of Health   Financial Resource Strain: Medium Risk (02/11/2021)    Overall Financial Resource Strain (CARDIA)    Difficulty of Paying Living Expenses: Somewhat hard  Food Insecurity: Food Insecurity Present (02/11/2021)   Hunger Vital Sign    Worried About Running Out of Food in the Last Year: Sometimes true    Ran Out of Food in the Last Year: Never true  Transportation Needs: Unmet Transportation Needs (03/23/2022)   PRAPARE - Hydrologist (Medical): Yes    Lack of Transportation (Non-Medical): No  Physical Activity: Insufficiently Active (02/11/2021)   Exercise Vital Sign    Days of Exercise per Week: 2 days    Minutes of Exercise per Session: 10 min  Stress: Stress Concern Present (02/11/2021)   [place]    Feeling of Stress : To some extent  Social Connections: Unknown (02/11/2021)   Social Connection and Isolation Panel [NHANES]    Frequency of Communication with Friends and Family: More than three times a week    Frequency of Social Gatherings with Friends and Family: Twice a week    Attends Religious Services: Not on Advertising copywriter or Organizations: Not on file    Attends Archivist Meetings: Never    Marital Status: Married  Human resources officer Violence: Not on file    Family History:   Family History  Problem Relation Age of Onset   Heart disease Mother    Hypertension Mother    Heart attack Mother    Hypertension Maternal Grandmother    Cancer Maternal Grandmother    Diabetes Paternal Grandmother    Stroke Neg Hx      ROS:  Please see the history of present illness.  All other ROS reviewed and negative.     Physical Exam/Data:   Vitals:   07/09/22 0900 07/09/22 0915 07/09/22 0930 07/09/22 0945  BP: 115/68 112/64 102/63 (!) 108/58  Pulse: 100 (!) 103 95 93  Resp: (!) _0 SpO2: 95% 100% 100% 100%  Weight:      Height:       No intake or output data in the 24 hours ending 07/09/22 1025    07/09/2022     7:47 AM 06/18/2022    1:38 PM 05/17/2022   11:59 AM  Last 3 Weights  Weight (lbs) 98 lb 98 lb 15.8 oz 99 lb 6.4 oz  Weight (kg) 44.453 kg 44.9 kg 45.088 kg     Body mass index is 19.79 kg/m.  General:  Well nourished, well developed, female, moderate respiratory distress on BiPAP HEENT: normal Neck:  JVD 12 cm Vascular: No carotid bruits; Distal pulses 2+ bilaterally Cardiac:  normal S1, S2; RRR; no murmur  Lungs: Coarse Rales to auscultation bilaterally, no wheezing, rhonchi  Abd: soft, nontender, no hepatomegaly  Ext: no edema Musculoskeletal:  No deformities, BUE and BLE strength normal and equal Skin: warm and dry  Neuro:  CNs 2-12 intact, no focal abnormalities noted Psych:  Normal affect   EKG:  The EKG was personally reviewed and demonstrates: Sinus tach, heart rate 102, Barostim causing artifact Telemetry:  Telemetry was personally reviewed and demonstrates: Sinus rhythm  Relevant CV Studies:  ECHO: 12/11/2021  1. Left ventricular ejection fraction, by estimation, is 20 to 25%. Left  ventricular ejection fraction by PLAX is 24 %. The left ventricle has  severely decreased function. The left ventricle demonstrates global  hypokinesis. The left ventricular internal cavity size was moderately dilated. Left ventricular diastolic parameters are consistent with Grade II diastolic dysfunction (pseudonormalization).   2. Right ventricular systolic function is normal. The right ventricular  size is normal. There is mildly elevated pulmonary artery systolic  pressure.   3. Left atrial size was severely dilated.   4. The mitral valve is degenerative. Trivial mitral valve regurgitation.  No evidence of mitral stenosis. The mean mitral valve gradient is 3.0 mmHg with average heart rate of 70 bpm.   5. The aortic valve is tricuspid. There is mild calcification of the  aortic valve. There is mild thickening of the aortic valve. Aortic valve  regurgitation is moderate. Aortic valve  sclerosis is present, with no  evidence of aortic valve stenosis. Aortic regurgitation PHT measures 311 msec.   6. The inferior vena cava is normal in size with <50% respiratory  variability, suggesting right atrial pressure of 8 mmHg.    LHC 02/12/19: Ost LAD to Prox LAD lesion is 100% stenosed. Ost Cx to Prox Cx lesion is 100% stenosed. Ost Ramus lesion is 99% stenosed. Post intervention, there is a 0% residual stenosis. A drug-eluting stent was successfully placed using a [place] 2.25X15. SVG and is normal in caliber. The graft exhibits no disease. Ost 1st Diag lesion is 70% stenosed. SVG and is normal in caliber. Prox Graft lesion is 100% stenosed. 1.  Significant underlying two-vessel coronary artery disease with patent SVG to OM and SVG to diagonal which supplies the LAD territory.  Atretic LIMA to LAD given competitive flow from SVG to diagonal.  Severe ostial ramus artery stenosis not supplied by bypass with his area suggestive of plaque rupture. 2.  Severely elevated left ventricular end-diastolic pressure 34 mmHg.  Left ventricular angiography was not performed. 3.  Successful angioplasty and drug-eluting stent placement to the ostial ramus extending into the distal left main.   [place] 2019  RA =  8 RV = 49/9 PA = 47/20 (32) PCW = 19 Fick cardiac output/index = 4.3/3.1 PVR = 3.0 wu AO sat = 93% PA sat = 62%, 64%  Assessment: 1. Minimally elevated left heart pressures 2. Mild PAH 3. Normal cardiac output  Laboratory Data:  High Sensitivity Troponin:   Recent Labs  Lab 06/17/22 1922 06/17/22 2156 07/09/22 0800  TROPONINIHS 40* 41* 37*     Chemistry Recent Labs  Lab 07/09/22 0800 07/09/22 0828  NA 138 140  140  K 4.6 4.7  4.7  CL 111 110  CO2 19*  --   GLUCOSE 271* 253*  BUN 10 15  CREATININE 1.26* 1.10*  CALCIUM 9.2  --   GFRNONAA 49*  --   ANIONGAP 8  --     Recent Labs  Lab 07/09/22 0800  PROT 7.3  ALBUMIN 3.6  AST 27  ALT 14   ALKPHOS 75  BILITOT 1.0   Lipids No results for input(s): "CHOL", "TRIG", "HDL", "LABVLDL", "LDLCALC", "CHOLHDL" in the last 168 hours.  Hematology Recent Labs  Lab 07/09/22 0800 07/09/22 0828  WBC 13.1*  --   RBC 4.68  --   HGB 14.0 15.3*  15.3*  HCT 45.3 45.0  45.0  MCV 96.8  --   MCH 29.9  --   MCHC 30.9  --   RDW 19.9*  --   PLT 186  --    Thyroid No results for input(s): "TSH", "FREET4" in the last 168 hours.  BNPNo results for input(s): "BNP", "PROBNP" in the last 168 hours.  DDimer No results for input(s): "DDIMER" in the last 168 hours.   Radiology/Studies:  DG Chest Port 1 View  Result Date: 07/09/2022 CLINICAL DATA:  Shortness of breath EXAM: PORTABLE CHEST 1 VIEW COMPARISON:  06/17/2022 FINDINGS: Cardiomegaly, cardiac surgical changes, LEFT pacemaker/AICD and generator pack overlying the RIGHT chest again noted. Mild interstitial opacities bilaterally are noted suggesting interstitial edema. A small RIGHT pleural effusion and RIGHT basilar opacity/atelectasis noted. There is no evidence of pneumothorax. IMPRESSION: 1. Cardiomegaly with mild interstitial opacities suggesting interstitial edema. 2. Small RIGHT pleural effusion and RIGHT basilar opacity - likely atelectasis. Electronically Signed   By: Margarette Canada M.D.   On: 07/09/2022 08:47     Assessment and Plan:   Acute on chronic combined CHF -According to the patient, no significant weight change, but clearly volume overloaded on exam -In the ER, she got Lasix 60 mg IV with good urine output, continue this twice daily -Continue other heart failure medications as blood pressure will allow -Earlier in July, her creatinine was approximately 1.5, it is trending down from admission 1.26>> 1.10 -History of hyperkalemia on Entresto, discontinued. - Her potassium level is trending up from baseline, she is on Kdur 20 meq qd and spiro 25 mg qd, follow with diuresis -We will recheck echo to see if her EF has improved  with the Barostim  2.  History of Medtronic ICD insertion -Last interrogation 06/22/2022, remote device check with no significant ventricular ectopy   Risk Assessment/Risk Scores:      New York Heart Association (NYHA) Functional Class NYHA Class IV   For questions or updates, please contact [facility] Please consult [email or web address] for contact info under    Signed, Rosaria Ferries, PA-C  07/09/2022 10:25 AM  I have seen and examined this patient with Rosaria Ferries.  Agree with above, note added to reflect my findings.  Patient with a history significant for atrial fibrillation, hypertension, small  cell lung cancer postchemotherapy and XRT, chronic systolic heart failure.  She presented to the hospital with significant shortness of breath.  She has a longstanding history of hospitalizations for her heart failure.  Her shortness of breath worsened over the last 3 days.  She potentially had more salt in her diet.  She has had no weight change, but she has PND, orthopnea.  She came to emergency room and was started on BiPAP and given IV Lasix.  She diuresed a liter of fluid and felt improved.  GEN: Well nourished, well developed, in no acute distress  HEENT: normal  Neck: JVD to the angle of the jaw, carotid bruits, or masses Cardiac: RRR; no murmurs, rubs, or gallops,no edema  Respiratory: Crackles throughout GI: soft, nontender, nondistended, + BS MS: no deformity or atrophy  Skin: warm and dry, device site well healed Neuro:  Strength and sensation are intact Psych: euthymic mood, full affect   Acute on chronic systolic heart failure: Patient was critically ill when she presented to the emergency room.  She is improved since diuresis with IV Lasix.  Her lactate is elevated at 3.4.  Fortunately her creatinine has remained low and stable.  She Amoy Steeves need continued IV diuresis.  She does have evidence of significant volume overload on exam.  We Shellsea Borunda also plan for PICC line placement.   We Navya Timmons repeat the lactate, and if it is elevated, Virga Haltiwanger start milrinone.   Byron Tipping M. Roann Merk MD 07/09/2022 11:38 AM

## 2022-07-09 NOTE — Progress Notes (Signed)
Spoke with Dr Curt Bears re Right side generator device.  Dr Curt Bears states it is extravascular and right side can be used for PICC placement.

## 2022-07-09 NOTE — Progress Notes (Signed)
Peripherally Inserted Central Catheter Placement  The IV Nurse has discussed with the patient and/or persons authorized to consent for the patient, the purpose of this procedure and the potential benefits and risks involved with this procedure.  The benefits include less needle sticks, lab draws from the catheter, and the patient may be discharged home with the catheter. Risks include, but not limited to, infection, bleeding, blood clot (thrombus formation), and puncture of an artery; nerve damage and irregular heartbeat and possibility to perform a PICC exchange if needed/ordered by physician.  Alternatives to this procedure were also discussed.  Bard Power PICC patient education guide, fact sheet on infection prevention and patient information card has been provided to patient /or left at bedside.    PICC Placement Documentation  PICC Double Lumen 06/10/15 Right Basilic 38 cm 0 cm (Active)  Indication for Insertion or Continuance of Line Vasoactive infusions;Chronic illness with exacerbations (CF, Sickle Cell, etc.) 07/09/22 1820  Exposed Catheter (cm) 0 cm 07/09/22 1820  Site Assessment Clean, Dry, Intact 07/09/22 1820  Lumen #1 Status Flushed;Saline locked;Blood return noted 07/09/22 1820  Lumen #2 Status Flushed;Saline locked;Blood return noted 07/09/22 1820  Dressing Type Transparent;Securing device 07/09/22 1820  Dressing Status Antimicrobial disc in place;Clean, Dry, Intact 07/09/22 1820  Safety Lock Not Applicable 12/19/30 3557  Line Care Connections checked and tightened 07/09/22 1820  Line Adjustment (NICU/IV Team Only) No 07/09/22 1820  Dressing Intervention New dressing 07/09/22 1820  Dressing Change Due 07/16/22 07/09/22 1820       Rolena Infante 07/09/2022, 6:21 PM

## 2022-07-09 NOTE — ED Provider Notes (Signed)
[facility] EMERGENCY DEPARTMENT Provider Note   CSN: [id number] Arrival date & time: 07/09/22  7353     History  Chief Complaint  Patient presents with   Shortness of Breath    Brittany [name] is a 61 y.o. female.  HPI Patient's medical history significant for COPD, chronic heart failure EF 20 to 25%, paroxysmal atrial fibrillation, small cell lung cancer.  Patient reports she has been getting more short of breath for several days.  This morning it got much worse.  Patient reports at baseline she is on 2 L of oxygen.  She does use nebulizer treatments at home for COPD as well.  She reports often her shortness of breath is due to congestive heart failure and fluid overload.  She reports she does not get swelling in her legs.  She reports she has been taking her torsemide but getting increasingly short of breath.  She reports she has a burning heartburn-like sensation in her chest.  Patient was transported by EMS and given a DuoNeb 10 mg albuterol 0.5 mg Atrovent and 125 mg Solu-Medrol and placed on CPAP.    Home Medications Prior to Admission medications   Medication Sig Start Date End Date Taking? Authorizing Provider  albuterol (VENTOLIN HFA) 108 (90 Base) MCG/ACT inhaler Inhale 2 puffs into the lungs every 6 (six) hours as needed for wheezing or shortness of breath. 06/20/22   Domenic Polite, MD  apixaban (ELIQUIS) 2.5 MG TABS tablet Take 1 tablet (2.5 mg total) by mouth 2 (two) times daily. 06/20/22   Domenic Polite, MD  atorvastatin (LIPITOR) 80 MG tablet Take 1 tablet (80 mg total) by mouth daily at 6 PM. 04/16/22   Hongalgi, Lenis Dickinson, MD  blood glucose meter kit and supplies KIT Dispense based on patient and insurance preference. Use up to four times daily as directed. 04/11/21   Bonnielee Haff, MD  FARXIGA 10 MG TABS tablet TAKE 1 TABLET (10 MG TOTAL) BY MOUTH DAILY BEFORE BREAKFAST. Patient taking differently: 10 mg daily. 08/16/21   Bensimhon, Shaune Pascal, MD   Glucerna (GLUCERNA) LIQD Take 237 mLs by mouth See admin instructions. Takes 1 bottle twice a week    [provider]  ipratropium-albuterol (DUONEB) 0.5-2.5 (3) MG/3ML SOLN Take 3 mLs by nebulization every 4 (four) hours as needed (for shortness of breath). 06/20/22   Domenic Polite, MD  ivabradine (CORLANOR) 7.5 MG TABS tablet TAKE 1 TABLET (7.5 MG TOTAL) BY MOUTH 2 (TWO) TIMES DAILY WITH A MEAL. Patient taking differently: Take 7.5 mg by mouth 2 (two) times daily with a meal. TAKE 1 TABLET (7.5 MG TOTAL) BY MOUTH 2 (TWO) TIMES DAILY WITH A MEAL. 06/10/22   Reino Bellis B, NP  metolazone (ZAROXOLYN) 2.5 MG tablet Take 2.5 mg by mouth daily as needed (edema).    [provider]  Multiple Vitamin (MULTIVITAMIN WITH MINERALS) TABS tablet Take 1 tablet by mouth daily. 12/15/21   Hongalgi, Lenis Dickinson, MD  naphazoline-pheniramine (VISINE) 0.025-0.3 % ophthalmic solution Place 1 drop into both eyes 4 (four) times daily as needed for eye irritation.    [provider]  nitroGLYCERIN (NITROSTAT) 0.4 MG SL tablet Place 1 tablet (0.4 mg total) under the tongue every 5 (five) minutes as needed for chest pain. Patient not taking: Reported on 06/18/2022 02/14/19   Shirley Friar, PA-C  potassium chloride SA (KLOR-CON M) 20 MEQ tablet Take 1 tablet (20 mEq total) by mouth daily. TAke extra 36mq when you take higher dose  of torsemide or metolazone 06/20/22   Domenic Polite, MD  predniSONE (DELTASONE) 20 MG tablet Take 2 tablets (60m) daily for 2 days then 1 tablet (274m daily for 2 days then STOP 06/21/22   JoDomenic PoliteMD  spironolactone (ALDACTONE) 25 MG tablet Take 1 tablet (25 mg total) by mouth daily. 05/19/22   [name], JeMaricela BoFNP  torsemide (DEMADEX) 20 MG tablet Take 2 tablets (40 mg total) by mouth daily. 06/20/22   JoDomenic PoliteMD      Allergies    Codeine    Review of Systems   Review of Systems Level 5 caveat cannot obtain review of systems due to patient  condition Physical Exam Updated Vital Signs BP (!) 101/53   Pulse 73   Temp 98 F (36.7 C) (Oral)   Resp 17   Ht 4' 11"  (1.499 m)   Wt 44.5 kg   SpO2 100%   BMI 19.79 kg/m  Physical Exam Constitutional:      Comments: Patient has moderately severe shortness of breath on arrival.  She is holding the BiPAP in front of her.  She can answer questions appropriately with short sentences  HENT:     Head: Normocephalic and atraumatic.     Mouth/Throat:     Pharynx: Oropharynx is clear.  Eyes:     Extraocular Movements: Extraocular movements intact.  Cardiovascular:     Comments: Tachycardia.  A lot of respiratory not noise cannot appreciate rub murmur gallop. Pulmonary:     Comments: Moderate to severe increased work of breathing.  Coarse crackles throughout. Abdominal:     General: There is no distension.     Palpations: Abdomen is soft.     Tenderness: There is no abdominal tenderness. There is no guarding.  Musculoskeletal:     Comments: No peripheral edema.  Feet are warm and dry.  Skin:    General: Skin is warm and dry.  Neurological:     Comments: Patient is somewhat fatigued in appearance but her speech is situationally oriented and clear.  No focal motor deficits     ED Results / Procedures / Treatments   Labs (all labs ordered are listed, but only abnormal results are displayed) Labs Reviewed  COMPREHENSIVE METABOLIC PANEL - Abnormal; Notable for the following components:      Result Value   CO2 19 (*)    Glucose, Bld 271 (*)    Creatinine, Ser 1.26 (*)    GFR, Estimated 49 (*)    All other components within normal limits  BRAIN NATRIURETIC PEPTIDE - Abnormal; Notable for the following components:   B Natriuretic Peptide 2,223.4 (*)    All other components within normal limits  LACTIC ACID, PLASMA - Abnormal; Notable for the following components:   Lactic Acid, Venous 3.6 (*)    All other components within normal limits  CBC WITH DIFFERENTIAL/PLATELET -  Abnormal; Notable for the following components:   WBC 13.1 (*)    RDW 19.9 (*)    Neutro Abs 8.8 (*)    Abs Immature Granulocytes 0.09 (*)    All other components within normal limits  I-STAT CHEM 8, ED - Abnormal; Notable for the following components:   Creatinine, Ser 1.10 (*)    Glucose, Bld 253 (*)    Hemoglobin 15.3 (*)    All other components within normal limits  I-STAT VENOUS BLOOD GAS, ED - Abnormal; Notable for the following components:   pH, Ven 7.226 (*)    pO2, Ven  52 (*)    Acid-base deficit 6.0 (*)    Hemoglobin 15.3 (*)    All other components within normal limits  CBG MONITORING, ED - Abnormal; Notable for the following components:   Glucose-Capillary 175 (*)    All other components within normal limits  TROPONIN I (HIGH SENSITIVITY) - Abnormal; Notable for the following components:   Troponin I (High Sensitivity) 37 (*)    All other components within normal limits  TROPONIN I (HIGH SENSITIVITY) - Abnormal; Notable for the following components:   Troponin I (High Sensitivity) 70 (*)    All other components within normal limits  RESP PANEL BY RT-PCR (FLU A&B, COVID) ARPGX2  CULTURE, BLOOD (ROUTINE X 2)  CULTURE, BLOOD (ROUTINE X 2)  PROTIME-INR  URINALYSIS, ROUTINE W REFLEX MICROSCOPIC  LACTIC ACID, PLASMA    EKG EKG Interpretation  Date/Time:  Sunday July 09 2022 08:22:04 EDT Ventricular Rate:  102 PR Interval:  141 QRS Duration: 183 QT Interval:  409 QTC Calculation: 536 R Axis:   -66 Text Interpretation: Poor quality data, interpretation may be affected Sinus tachycardia no sig change from previous Confirmed by Charlesetta Shanks [phone]) on 07/09/2022 8:24:42 AM  Radiology [place] EKG SITE RITE  Result Date: 07/09/2022 If Site Rite image not attached, placement could not be confirmed due to current cardiac rhythm.  DG Chest Port 1 View  Result Date: 07/09/2022 CLINICAL DATA:  Shortness of breath EXAM: PORTABLE CHEST 1 VIEW COMPARISON:  06/17/2022  FINDINGS: Cardiomegaly, cardiac surgical changes, LEFT pacemaker/AICD and generator pack overlying the RIGHT chest again noted. Mild interstitial opacities bilaterally are noted suggesting interstitial edema. A small RIGHT pleural effusion and RIGHT basilar opacity/atelectasis noted. There is no evidence of pneumothorax. IMPRESSION: 1. Cardiomegaly with mild interstitial opacities suggesting interstitial edema. 2. Small RIGHT pleural effusion and RIGHT basilar opacity - likely atelectasis. Electronically Signed   By: Margarette Canada M.D.   On: 07/09/2022 08:47    Procedures Procedures   CRITICAL CARE Performed by: Charlesetta Shanks   Total critical care time: 60 minutes  Critical care time was exclusive of separately billable procedures and treating other patients.  Critical care was necessary to treat or prevent imminent or life-threatening deterioration.  Critical care was time spent personally by me on the following activities: development of treatment plan with patient and/or surrogate as well as nursing, discussions with consultants, evaluation of patient's response to treatment, examination of patient, obtaining history from patient or surrogate, ordering and performing treatments and interventions, ordering and review of laboratory studies, ordering and review of radiographic studies, pulse oximetry and re-evaluation of patient's condition.  Medications Ordered in ED Medications  albuterol (VENTOLIN HFA) 108 (90 Base) MCG/ACT inhaler 2 puff (has no administration in time range)  apixaban (ELIQUIS) tablet 2.5 mg (has no administration in time range)  atorvastatin (LIPITOR) tablet 80 mg (has no administration in time range)  dapagliflozin propanediol (FARXIGA) tablet 10 mg (has no administration in time range)  ipratropium-albuterol (DUONEB) 0.5-2.5 (3) MG/3ML nebulizer solution 3 mL (has no administration in time range)  ivabradine (CORLANOR) tablet 7.5 mg (has no administration in time range)   multivitamin with minerals tablet 1 tablet (has no administration in time range)  naphazoline-pheniramine (NAPHCON-A) 0.025-0.3 % ophthalmic solution 1 drop (has no administration in time range)  potassium chloride SA (KLOR-CON M) CR tablet 20 mEq (has no administration in time range)  spironolactone (ALDACTONE) tablet 25 mg (has no administration in time range)  furosemide (LASIX) injection 60 mg (60 mg  Intravenous Given 07/09/22 0800)  albuterol (PROVENTIL,VENTOLIN) solution continuous neb (10 mg/hr Nebulization Given 07/09/22 0854)  ipratropium (ATROVENT) nebulizer solution 0.5 mg (0.5 mg Nebulization Given 07/09/22 0815)    ED Course/ Medical Decision Making/ A&P                           Medical Decision Making Amount and/or Complexity of Data Reviewed Labs: ordered. Radiology: ordered.  Risk Prescription drug management. Decision regarding hospitalization.   Patient presents in severe respiratory distress.  BiPAP had been tried by EMS.  Patient had 1 episode of vomiting but describes it really more is coughing up copious amount of phlegm material.  Solu-Medrol and DuoNeb had been started on route.  Patient reports that her symptoms are often secondary to congestive heart failure.  EMR review shows patient to have EF of 20 to 25%.  Physical exam showed extensive crackles.  Oxygen saturations were staying at 100% with oxygen.  At that time I suspect this is CHF and will treat both COPD CHF concurrently.  Lasix 60 mg IV ordered.  Patient transitioned back onto BiPAP.  Mental status is alert for BiPAP.  She can answer questions with meaningful sentences and is not confused or somnolent.  She is fatigued but advises she wants to continue to use the BiPAP.  I had constant bedside attendance and patient is showing improvement on BiPAP.  She is becoming more relaxed and reports objective improvement.  Speech is clear with the BiPAP on.  She is not showing any signs of obtundation.  Patient is  getting continuous hour-long nebulizer therapy in conjunction with Lasix  At bedside I have reviewed portable chest x-ray.  Patient has significant cardiomegaly.  Irregular infiltrate or congestion pronounced along the right heart border.  Focal consolidations.  Positive vascularization.  Quick monitor assessment shows the patient to have a regular narrow complex rhythm low 100s to 120s.  Consult: Reviewed with cardiology for admission.  Dr. Curt Bears and PA-C [place].  Patient has complex cardiac history.  She presents with dyspnea.  Findings are most suggestive of acute CHF exacerbation on chronic.  Patient has shown improvement on BiPAP.  Patient will require admission and ongoing treatment stabilization.  Cardiology has evaluated and determined they will admit to cardiology service.          Final Clinical Impression(s) / ED Diagnoses Final diagnoses:  Acute on chronic respiratory failure, unspecified whether with hypoxia or hypercapnia ([place])  Severe comorbid illness    Rx / DC Orders ED Discharge Orders     None         Charlesetta Shanks, MD 07/09/22 1435

## 2022-07-09 NOTE — ED Notes (Signed)
Cardiology PA aware of increasing troponin levels.

## 2022-07-10 ENCOUNTER — Other Ambulatory Visit: Payer: Self-pay

## 2022-07-10 ENCOUNTER — Inpatient Hospital Stay (HOSPITAL_COMMUNITY): Payer: BC Managed Care – PPO

## 2022-07-10 DIAGNOSIS — I5021 Acute systolic (congestive) heart failure: Secondary | ICD-10-CM

## 2022-07-10 DIAGNOSIS — I5041 Acute combined systolic (congestive) and diastolic (congestive) heart failure: Secondary | ICD-10-CM | POA: Diagnosis not present

## 2022-07-10 LAB — ECHOCARDIOGRAM COMPLETE
Area-P 1/2: 3.79 cm2
Height: 59 in
S' Lateral: 4.9 cm
Weight: 1675.5 oz

## 2022-07-10 LAB — GLUCOSE, CAPILLARY
Glucose-Capillary: 140 mg/dL — ABNORMAL HIGH (ref 70–99)
Glucose-Capillary: 116 mg/dL — ABNORMAL HIGH (ref 70–99)
Glucose-Capillary: 127 mg/dL — ABNORMAL HIGH (ref 70–99)
Glucose-Capillary: 125 mg/dL — ABNORMAL HIGH (ref 70–99)

## 2022-07-10 LAB — COOXEMETRY PANEL
Carboxyhemoglobin: 1.7 % — ABNORMAL HIGH (ref 0.5–1.5)
Carboxyhemoglobin: 0.6 % (ref 0.5–1.5)
Methemoglobin: 0.9 % (ref 0.0–1.5)
Methemoglobin: 1.1 % (ref 0.0–1.5)
O2 Saturation: 89.1 %
O2 Saturation: 82.4 %
Total hemoglobin: 12.6 g/dL (ref 12.0–16.0)
Total hemoglobin: 12.6 g/dL (ref 12.0–16.0)

## 2022-07-10 LAB — PROCALCITONIN: Procalcitonin: 0.1 ng/mL

## 2022-07-10 LAB — BASIC METABOLIC PANEL
Anion gap: 7 (ref 5–15)
BUN: 32 mg/dL — ABNORMAL HIGH (ref 8–23)
CO2: 24 mmol/L (ref 22–32)
Calcium: 9.3 mg/dL (ref 8.9–10.3)
Chloride: 104 mmol/L (ref 98–111)
Creatinine, Ser: 1.52 mg/dL — ABNORMAL HIGH (ref 0.44–1.00)
GFR, Estimated: 39 mL/min — ABNORMAL LOW (ref >60)
Glucose, Bld: 106 mg/dL — ABNORMAL HIGH (ref 70–99)
Potassium: 4.4 mmol/L (ref 3.5–5.1)
Sodium: 135 mmol/L (ref 135–145)

## 2022-07-10 LAB — CBC
HCT: 36.8 % (ref 36.0–46.0)
Hemoglobin: 11.9 g/dL — ABNORMAL LOW (ref 12.0–15.0)
MCH: 30.2 pg (ref 26.0–34.0)
MCHC: 32.3 g/dL (ref 30.0–36.0)
MCV: 93.4 fL (ref 80.0–100.0)
Platelets: 133 10*3/uL — ABNORMAL LOW (ref 150–400)
RBC: 3.94 MIL/uL (ref 3.87–5.11)
RDW: 18.9 % — ABNORMAL HIGH (ref 11.5–15.5)
WBC: 13.2 10*3/uL — ABNORMAL HIGH (ref 4.0–10.5)
nRBC: 0 % (ref 0.0–0.2)

## 2022-07-10 LAB — LACTIC ACID, PLASMA
Lactic Acid, Venous: 1.8 mmol/L (ref 0.5–1.9)
Lactic Acid, Venous: 1.1 mmol/L (ref 0.5–1.9)

## 2022-07-10 MED ORDER — FUROSEMIDE 10 MG/ML IJ SOLN
60.0000 mg | Freq: Once | INTRAMUSCULAR | Status: AC
  Administered 2022-07-10: 60 mg via INTRAVENOUS
  Filled 2022-07-10: qty 6

## 2022-07-10 MED ORDER — GUAIFENESIN-DM 100-10 MG/5ML PO SYRP
5.0000 mL | ORAL_SOLUTION | ORAL | Status: DC | PRN
  Administered 2022-07-10 – 2022-07-12 (×5): 5 mL via ORAL
  Filled 2022-07-10 (×5): qty 5

## 2022-07-10 MED ORDER — PREDNISONE 20 MG PO TABS
40.0000 mg | ORAL_TABLET | Freq: Every day | ORAL | Status: DC
  Administered 2022-07-11 – 2022-07-12 (×2): 40 mg via ORAL
  Filled 2022-07-10 (×2): qty 2

## 2022-07-10 MED ORDER — DOXYCYCLINE HYCLATE 100 MG PO TABS
100.0000 mg | ORAL_TABLET | Freq: Two times a day (BID) | ORAL | Status: DC
Start: 2022-07-10 — End: 2022-07-12
  Administered 2022-07-10 – 2022-07-12 (×5): 100 mg via ORAL
  Filled 2022-07-10 (×5): qty 1

## 2022-07-10 MED ORDER — ADULT MULTIVITAMIN W/MINERALS CH
1.0000 | ORAL_TABLET | Freq: Every day | ORAL | Status: DC
  Administered 2022-07-11: 1 via ORAL
  Filled 2022-07-10: qty 1

## 2022-07-10 NOTE — Progress Notes (Signed)
Echocardiogram 2D Echocardiogram has been performed.  Ronny Flurry 07/10/2022, 9:57 AM

## 2022-07-10 NOTE — Progress Notes (Addendum)
Advanced Heart Failure Team Consult Note   Primary Physician: Traci Sermon, PA-C PCP-Cardiologist:  None  Reason for Consultation: Acute on Chronic systolic heart failure  HPI:    Ruben Pyka is seen today for evaluation of Acute on Chronic systolic heart failure at the request of Dr. Curt Bears.   Mylo Driskill is a 61 y.o.Marland Kitchen female with a history of PAF,  previous smoker quit 2015  years ago, hypertension, previous small cell lung cancer treated with chemo, chest XRT and prophylactic brain radiation in 2015, CAD s/p CABG and chronic systolic HF EF ~70%   Admitted 02/03/2018 with NSTEMI and shock. Underwent emergent cath 02/03/18 showed LAD 100% stenosed, LCx 95% stenosed. Taken for emergent CABG 02/03/18. Required impella post op. Hospital course complicated by cardiogenic shock, HCAP, A fib, respiratory failure, and swallowing issues. She was discharged to SNF. Discharge weight 103 pounds.    In 2019 had multiple hospitalizations for HF and pleural effusion. Underwent pleurodesis at [facility].   Admitted 3/20 with NSTEMI and HF. Received DES to ostial ramus into distal left main based on cath below and underwent diuresis. Meds adjusted as tolerated. Echo with EF 25-30%.    Admitted 11/21 with COPD flare with mild HF and hemoptysis. CTA showed stable scarring in the right hilum consistent with emphysema.. Underwent bronchoscopy on 11/15, no obvious source of bleeding found on examination.    Echo 11/21 EF 20-25%. RV mildly HK.    Admitted 3/22 with chest pain and shortness of breath. HS-Trop with no trend. Echo with EF < 20%. Treated for AECOPD exacerbation. Breathing improved. Discharged on torsemide 60 mg daily.   Admitted 4/22 with ADHF. Diuresed with IV lasix and transitioned back to torsemide 80 mg daily x 4 days then down to torsemide 60 mg daily.    Admitted 12/22 with R renal infarct felt to be cardio-embolic (no AF found). Started on Eliquis   Was seen  in ER 4/23 for worsening SOB. CXR ok. Given 1 dose of IV lasix and nebs.    Follow up 03/24/22, breathing improved, fluid OK on OptiVol and exam, weight 101 lbs.   Admitted 5/23 with a/c CHF and COPD. Diuresed with IV lasix. Treated with doxy, pred and nebs for COPD exacerbation. Underwent Barostim 04/14/22. Discharged home, weight 100 lbs.  She was last seen in clinic 6/23 and was feeling much better on weekly metolazone and post Barostim.    Presented to the ED with 3 days of worsening SOB and DOE. Despite being compliant with medications symptoms have progressively worsened, now with orthopnea, PND and she felt like she was "drowning". Admits to drinking extra fluid d/t the heat and eating out a lot recently. Denies taking metolazone recently.  In the ED she was placed on BiPap and given 59m IV lasix, she diuresed ~1L and felt some improvement in her breathing. BNP 2223, HsTrop 37>>70, lactic acid 4.8, PCXR: Cardiac enlargement. Emphysematous changes in the lungs. No evidence of active pulmonary disease. EKG: sinus tachycardia 102 bpm  ECHO at bedside. Denies palpitations, CP, dizziness, edema. Some SOB, stable on [place]. No fever or chills.   Cardidac Studies: ECHO 1/23: EF 20-25%, severely reduced LV with global HK, grade II DD, normal RV ECHO 02/10/21: EF <20%, G2DD ECHO 2022: EF < 20% RV normal  ECHO 2021: EF 20-25% RV mildly reduced.  ECHO 02/12/19: EF 25-30%, RV mildly reduced   05/18/2021 PFT's: moderately severe airway obstruction and a diffusion defect suggesting emphysema.   LHC  02/12/19: Ost LAD to Prox LAD lesion is 100% stenosed. Ost Cx to Prox Cx lesion is 100% stenosed. Ost Ramus lesion is 99% stenosed. Post intervention, there is a 0% residual stenosis. A drug-eluting stent was successfully placed using a [place] [id number]. SVG and is normal in caliber. The graft exhibits no disease. Ost 1st Diag lesion is 70% stenosed. SVG and is normal in caliber. Prox Graft lesion  is 100% stenosed. 1.  Significant underlying two-vessel coronary artery disease with patent SVG to OM and SVG to diagonal which supplies the LAD territory.  Atretic LIMA to LAD given competitive flow from SVG to diagonal.  Severe ostial ramus artery stenosis not supplied by bypass with his area suggestive of plaque rupture. 2.  Severely elevated left ventricular end-diastolic pressure 34 mmHg.  Left ventricular angiography was not performed. 3.  Successful angioplasty and drug-eluting stent placement to the ostial ramus extending into the distal left main.   [place] 2019  RA = 8 RV = 49/9 PA = 47/20 (32) PCW = 19 Fick cardiac output/index = 4.3/3.1 PVR = 3.0 wu AO sat = 93% PA sat = 62%, 64%  Assessment: 1. Minimally elevated left heart pressures 2. Mild PAH 3. Normal cardiac output  Zio patch 01/23/22: 1. Sinus rhythm - avg HR of 81 bpm. 2. One run of nonsustained Ventricular Tachycardia occurred lasting 5 beats with a max rate of 135 bpm (avg 109 bpm).  3. Two runs of Supraventricular Tachycardia occurred, the run with the fastest interval lasting 8 beats with a max rate of 174 bpm, the longest lasting 9 beats with an avg rate of 160 bpm. 4. Rare PACs and PVCs  Review of Systems: [y] = yes, _0  = no   General: Weight gain _1 ; Weight loss [ Y]; Anorexia _2 ; Fatigue _3 ; Fever _4 ; Chills _5 ; Weakness [Y ]  Cardiac: Chest pain/pressure _6 ; Resting SOB _7 ; Exertional SOB [Y ]; Orthopnea [ Y]; Pedal Edema _8 ; Palpitations _9 ; Syncope _10 ; Presyncope _11 ; Paroxysmal nocturnal dyspnea[ Y]  Pulmonary: Cough [ Y]; Wheezing_12 ; Hemoptysis_13 ; Sputum [ Y]; Snoring _14   GI: Vomiting_15 ; Dysphagia_16 ; Melena_17 ; Hematochezia _18 ; Heartburn_19 ; Abdominal pain _20 ; Constipation _21 ; Diarrhea _22 ; BRBPR _23   GU: Hematuria_24 ; Dysuria _25 ; Nocturia_26   Vascular: Pain in legs with walking _27 ; Pain in feet with lying flat _28 ; Non-healing sores _29 ; Stroke _30 ; TIA _31 ; Slurred speech _32 ;   Neuro: Headaches_33 ; Vertigo_34 ; Seizures_35 ; Paresthesias_36 ;Blurred vision _37 ; Diplopia _38 ; Vision changes _39   Ortho/Skin: Arthritis _40 ; Joint pain _41 ; Muscle pain _42 ; Joint swelling _43 ; Back Pain _44 ; Rash _45   Psych: Depression_46 ; Anxiety_47   Heme: Bleeding problems _48 ; Clotting disorders _49 ; Anemia _50   Endocrine: Diabetes _51 ; Thyroid dysfunction_52   Home Medications Prior to Admission medications   Medication Sig Start Date End Date Taking? Authorizing Provider  albuterol (VENTOLIN HFA) 108 (90 Base) MCG/ACT inhaler Inhale 2 puffs into the lungs every 6 (six) hours as needed for wheezing or shortness of breath. 06/20/22  Yes Domenic Polite, MD  apixaban (ELIQUIS) 2.5 MG TABS tablet Take 1 tablet (2.5 mg total) by mouth 2 (two) times daily. 06/20/22  Yes Domenic Polite, MD  atorvastatin (LIPITOR) 80 MG tablet Take 1 tablet (80 mg total)  by mouth daily at 6 PM. 04/16/22  Yes Hongalgi, Lenis Dickinson, MD  FARXIGA 10 MG TABS tablet TAKE 1 TABLET (10 MG TOTAL) BY MOUTH DAILY BEFORE BREAKFAST. Patient taking differently: 10 mg daily. 08/16/21  Yes Bensimhon, Shaune Pascal, MD  Glucerna (GLUCERNA) LIQD Take 237 mLs by mouth See admin instructions. Takes 1 bottle twice a week   Yes [provider]  ipratropium-albuterol (DUONEB) 0.5-2.5 (3) MG/3ML SOLN Take 3 mLs by nebulization every 4 (four) hours as needed (for shortness of breath). 06/20/22  Yes Domenic Polite, MD  ivabradine (CORLANOR) 7.5 MG TABS tablet TAKE 1 TABLET (7.5 MG TOTAL) BY MOUTH 2 (TWO) TIMES DAILY WITH A MEAL. Patient taking differently: Take 7.5 mg by mouth 2 (two) times daily with a meal. TAKE 1 TABLET (7.5 MG TOTAL) BY MOUTH 2 (TWO) TIMES DAILY WITH A MEAL. 06/10/22  Yes Reino Bellis B, NP  metolazone (ZAROXOLYN) 2.5 MG tablet Take 2.5 mg by mouth daily as needed (edema).   Yes [provider]  Multiple Vitamin (MULTIVITAMIN WITH MINERALS) TABS tablet Take 1 tablet by mouth daily. 12/15/21  Yes Hongalgi, Lenis Dickinson,  MD  naphazoline-pheniramine (VISINE) 0.025-0.3 % ophthalmic solution Place 1 drop into both eyes 4 (four) times daily as needed for eye irritation.   Yes [provider]  nitroGLYCERIN (NITROSTAT) 0.4 MG SL tablet Place 1 tablet (0.4 mg total) under the tongue every 5 (five) minutes as needed for chest pain. 02/14/19  Yes Shirley Friar, PA-C  potassium chloride SA (KLOR-CON M) 20 MEQ tablet Take 1 tablet (20 mEq total) by mouth daily. TAke extra 21mq when you take higher dose of torsemide or metolazone 06/20/22  Yes JDomenic Polite MD  spironolactone (ALDACTONE) 25 MG tablet Take 1 tablet (25 mg total) by mouth daily. 05/19/22  Yes [name], JMaricela Bo FNP  torsemide (DEMADEX) 20 MG tablet Take 2 tablets (40 mg total) by mouth daily. 06/20/22  Yes JDomenic Polite MD  blood glucose meter kit and supplies KIT Dispense based on patient and insurance preference. Use up to four times daily as directed. 04/11/21   KBonnielee Haff MD  predniSONE (DELTASONE) 20 MG tablet Take 2 tablets (437m daily for 2 days then 1 tablet (2063mdaily for 2 days then STOP Patient not taking: Reported on 07/09/2022 06/21/22   JosDomenic PoliteD    Past Medical History: Past Medical History:  Diagnosis Date   Acute respiratory failure (HCCSt. Martin5/09/23/3007Acute systolic congestive heart failure (HCCParc2/24/2019   AICD (automatic cardioverter/defibrillator) present    Allergy    Anxiety    Asthma    DM2 (diabetes mellitus, type 2) (HCCThyned[place]1/09/2020   Hypertension    PAF (paroxysmal atrial fibrillation) ([place]CC[place]  Presence of permanent cardiac pacemaker    Prophylactic measure 08/03/14-08/19/14   Prophyl. 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   LIMA to LAD, SVG to D1, SVG to OM1, EVH via right thigh with implantation of Impella LD LVAD via direct aortic approach   Small cell lung cancer (HCCElk[place]4/05/2014    Past Surgical History: Past Surgical History:  Procedure Laterality Date    BRONCHIAL BRUSHINGS  10/25/2020   Procedure: BRONCHIAL BRUSHINGS;  Surgeon: ByrCollene GobbleD;  [facility];  Service: Pulmonary;;   BRONCHIAL NEEDLE ASPIRATION BIOPSY  10/25/2020   Procedure: BRONCHIAL NEEDLE ASPIRATION BIOPSIES;  Surgeon: ByrCollene GobbleD;  [facility];  Service: Pulmonary;;   CARDIAC DEFIBRILLATOR  PLACEMENT  08/15/2018   MDT Visia AF MRI VR ICD implanted by Dr Loel Lofty for primary prevention of sudden   CESAREAN SECTION     CORONARY ARTERY BYPASS GRAFT N/A 02/03/2018   Procedure: CORONARY ARTERY BYPASS GRAFTING (CABG);  Surgeon: Rexene Alberts, MD;  Location: [place];  Service: Open Heart Surgery;  Laterality: N/A;  Time 3 using left internal mammary artery and endoscopically harvested right saphenous vein   CORONARY BALLOON ANGIOPLASTY N/A 02/03/2018   Procedure: CORONARY BALLOON ANGIOPLASTY;  Surgeon: Martinique, Peter M, MD;  Location: [place] CV LAB;  Service: Cardiovascular;  Laterality: N/A;   CORONARY STENT INTERVENTION N/A 02/12/2019   Procedure: CORONARY STENT INTERVENTION;  Surgeon: Wellington Hampshire, MD;  Location: [place] CV LAB;  Service: Cardiovascular;  Laterality: N/A;   CORONARY/GRAFT ACUTE MI REVASCULARIZATION N/A 02/03/2018   Procedure: Coronary/Graft Acute MI Revascularization;  Surgeon: Martinique, Peter M, MD;  Location: [place] CV LAB;  Service: Cardiovascular;  Laterality: N/A;   ENDOBRONCHIAL ULTRASOUND N/A 10/25/2020   Procedure: ENDOBRONCHIAL ULTRASOUND;  Surgeon: Collene Gobble, MD;  Location: [facility] ENDOSCOPY;  Service: Pulmonary;  Laterality: N/A;   FLEXIBLE BRONCHOSCOPY  10/25/2020   Procedure: FLEXIBLE BRONCHOSCOPY;  Surgeon: Collene Gobble, MD;  Location: [facility] ENDOSCOPY;  Service: Pulmonary;;   IABP INSERTION N/A 02/03/2018   Procedure: IABP Insertion;  Surgeon: Martinique, Peter M, MD;  Location: [place] CV LAB;  Service: Cardiovascular;  Laterality: N/A;   INTRAOPERATIVE TRANSESOPHAGEAL ECHOCARDIOGRAM N/A 02/03/2018    Procedure: INTRAOPERATIVE TRANSESOPHAGEAL ECHOCARDIOGRAM;  Surgeon: Rexene Alberts, MD;  Location: [place];  Service: Open Heart Surgery;  Laterality: N/A;   LEFT HEART CATH AND CORONARY ANGIOGRAPHY N/A 02/03/2018   Procedure: LEFT HEART CATH AND CORONARY ANGIOGRAPHY;  Surgeon: Martinique, Peter M, MD;  Location: [place] CV LAB;  Service: Cardiovascular;  Laterality: N/A;   LEFT HEART CATH AND CORS/GRAFTS ANGIOGRAPHY N/A 02/12/2019   Procedure: LEFT HEART CATH AND CORS/GRAFTS ANGIOGRAPHY;  Surgeon: Wellington Hampshire, MD;  Location: [place] CV LAB;  Service: Cardiovascular;  Laterality: N/A;   MEDIASTINOSCOPY N/A 03/11/2014   Procedure: MEDIASTINOSCOPY;  Surgeon: Melrose Nakayama, MD;  Location: [place];  Service: Thoracic;  Laterality: N/A;   PLACEMENT OF IMPELLA LEFT VENTRICULAR ASSIST DEVICE  02/03/2018   Procedure: PLACEMENT OF [place] LEFT VENTRICULAR ASSIST DEVICE LD;  Surgeon: Rexene Alberts, MD;  Location: [place];  Service: Open Heart Surgery;;   REMOVAL OF [place] LEFT VENTRICULAR ASSIST DEVICE N/A 02/08/2018   Procedure: REMOVAL OF [place] LEFT VENTRICULAR ASSIST DEVICE;  Surgeon: Rexene Alberts, MD;  Location: [place];  Service: Open Heart Surgery;  Laterality: N/A;   RIGHT HEART CATH N/A 02/03/2018   Procedure: RIGHT HEART CATH;  Surgeon: Martinique, Peter M, MD;  Location: [place] CV LAB;  Service: Cardiovascular;  Laterality: N/A;   RIGHT HEART CATH N/A 05/09/2018   Procedure: RIGHT HEART CATH;  Surgeon: Jolaine Artist, MD;  Location: [place] CV LAB;  Service: Cardiovascular;  Laterality: N/A;   TEE WITHOUT CARDIOVERSION N/A 02/08/2018   Procedure: TRANSESOPHAGEAL ECHOCARDIOGRAM (TEE);  Surgeon: Rexene Alberts, MD;  Location: [place];  Service: Open Heart Surgery;  Laterality: N/A;   TUBAL LIGATION     VIDEO BRONCHOSCOPY WITH ENDOBRONCHIAL ULTRASOUND N/A 03/11/2014   Procedure: VIDEO BRONCHOSCOPY WITH ENDOBRONCHIAL ULTRASOUND;  Surgeon: Melrose Nakayama, MD;  [facility];   Service: Thoracic;  Laterality: N/A;    Family History: Family History  Problem Relation Age  of Onset   Heart disease Mother    Hypertension Mother    Heart attack Mother    Hypertension Maternal Grandmother    Cancer Maternal Grandmother    Diabetes Paternal Grandmother    Stroke Neg Hx     Social History: Social History   Socioeconomic History   Marital status: Married    Spouse name: Michell Heinrich    Number of children: 1   Years of education: Not on file   Highest education level: Not on file  Occupational History   Not on file  Tobacco Use   Smoking status: Former    Packs/day: 1.00    Years: 20.00    Total pack years: 20.00    Types: Cigarettes    Quit date: 03/13/2014    Years since quitting: 8.3   Smokeless tobacco: Never  Vaping Use   Vaping Use: Never used  Substance and Sexual Activity   Alcohol use: Yes    Alcohol/week: 8.0 standard drinks of alcohol    Types: 8 Glasses of wine per week   Drug use: Yes    Types: Marijuana    Comment: "medicinal"   Sexual activity: Never  Other Topics Concern   Not on file  Social History Narrative   Not on file   Social Determinants of Health   Financial Resource Strain: Medium Risk (02/11/2021)   Overall Financial Resource Strain (CARDIA)    Difficulty of Paying Living Expenses: Somewhat hard  Food Insecurity: Food Insecurity Present (02/11/2021)   Hunger Vital Sign    Worried About Running Out of Food in the Last Year: Sometimes true    Ran Out of Food in the Last Year: Never true  Transportation Needs: Unmet Transportation Needs (03/23/2022)   PRAPARE - Hydrologist (Medical): Yes    Lack of Transportation (Non-Medical): No  Physical Activity: Insufficiently Active (02/11/2021)   Exercise Vital Sign    Days of Exercise per Week: 2 days    Minutes of Exercise per Session: 10 min  Stress: Stress Concern Present (02/11/2021)   [place]    Feeling of Stress : To some extent  Social Connections: Unknown (02/11/2021)   Social Connection and Isolation Panel [NHANES]    Frequency of Communication with Friends and Family: More than three times a week    Frequency of Social Gatherings with Friends and Family: Twice a week    Attends Religious Services: Not on Advertising copywriter or Organizations: Not on file    Attends Archivist Meetings: Never    Marital Status: Married    Allergies:  Allergies  Allergen Reactions   Codeine Nausea And Vomiting    Objective:    Vital Signs:   Temp:  [97.7 F (36.5 C)-98.8 F (37.1 C)] 97.9 F (36.6 C) (07/31 0735) Pulse Rate:  [73-106] 80 (07/31 0735) Resp:  [14-27] 20 (07/31 0735) BP: (92-149)/(48-84) 97/53 (07/31 0735) SpO2:  [95 %-100 %] 100 % (07/31 0735) FiO2 (%):  [36 %] 36 % (07/30 0815) Weight:  [46.4 kg-47.5 kg] 47.5 kg (07/31 0420) Last BM Date : 07/09/22  Weight change: Filed Weights   07/09/22 0747 07/09/22 1724 07/10/22 0420  Weight: 44.5 kg 46.4 kg 47.5 kg    Intake/Output:   Intake/Output Summary (Last 24 hours) at 07/10/2022 0805 Last data filed at 07/10/2022 0600 Gross per 24 hour  Intake 32.61 ml  Output --  Net 32.61 ml     CVP 13  Physical Exam    General:  chronically ill appearing.  HEENT: normal Neck: supple. JVD to jaw. Carotids 2+ bilat; no bruits. No lymphadenopathy or thyromegaly appreciated. Cor: PMI nondisplaced. Regular rate & rhythm. No rubs, gallops or murmurs. Lungs: coarse, diminished at bases with expiratory wheezes. Abdomen: soft, nontender, nondistended. No hepatosplenomegaly. No bruits or masses. Good bowel sounds. Extremities: no cyanosis, clubbing, rash, edema. PICC RUE Neuro: alert & oriented x 3, cranial nerves grossly intact. moves all 4 extremities w/o difficulty. Affect pleasant.   Telemetry   NSR 80-90's (Personally reviewed)    EKG    EKG: sinus tachycardia 102  bpm  Labs   Basic Metabolic Panel: Recent Labs  Lab 07/09/22 0800 07/09/22 0828 07/10/22 0444  NA 138 140  140 135  K 4.6 4.7  4.7 4.4  CL 111 110 104  CO2 19*  --  24  GLUCOSE 271* 253* 106*  BUN 10 15 32*  CREATININE 1.26* 1.10* 1.52*  CALCIUM 9.2  --  9.3    Liver Function Tests: Recent Labs  Lab 07/09/22 0800  AST 27  ALT 14  ALKPHOS 75  BILITOT 1.0  PROT 7.3  ALBUMIN 3.6   No results for input(s): "LIPASE", "AMYLASE" in the last 168 hours. No results for input(s): "AMMONIA" in the last 168 hours.  CBC: Recent Labs  Lab 07/09/22 0800 07/09/22 0828  WBC 13.1*  --   NEUTROABS 8.8*  --   HGB 14.0 15.3*  15.3*  HCT 45.3 45.0  45.0  MCV 96.8  --   PLT 186  --     Cardiac Enzymes: No results for input(s): "CKTOTAL", "CKMB", "CKMBINDEX", "TROPONINI" in the last 168 hours.  BNP: BNP (last 3 results) Recent Labs    04/25/22 1244 06/17/22 1922 07/09/22 0800  BNP 1,232.9* 862.1* 2,223.4*    ProBNP (last 3 results) Recent Labs    07/19/21 0945  PROBNP 3,162*     CBG: Recent Labs  Lab 07/09/22 1134 07/09/22 1754 07/09/22 2114 07/10/22 0623  GLUCAP 175* 252* 302* 140*    Coagulation Studies: Recent Labs    07/09/22 0800  LABPROT 14.3  INR 1.1     Imaging   DG CHEST PORT 1 VIEW  Result Date: 07/09/2022 CLINICAL DATA:  Shortness of breath EXAM: PORTABLE CHEST 1 VIEW COMPARISON:  07/09/2022 FINDINGS: Postoperative changes in the mediastinum. Cardiac pacemaker with generator pack in the left chest. Cervical spinal stimulator leads with generator pack in the right chest. Right PICC line with tip over the low SVC region. Mild cardiac enlargement. Emphysematous changes in the lungs. Scattered fibrosis. No airspace disease or consolidation. No pleural effusions. No pneumothorax. IMPRESSION: Cardiac enlargement. Emphysematous changes in the lungs. No evidence of active pulmonary disease. Electronically Signed   By: Lucienne Capers M.D.    On: 07/09/2022 19:30   [place] EKG SITE RITE  Result Date: 07/09/2022 If Site Rite image not attached, placement could not be confirmed due to current cardiac rhythm.    Medications:     Current Medications:  apixaban  2.5 mg Oral BID   atorvastatin  80 mg Oral q1800   Chlorhexidine Gluconate Cloth  6 each Topical Daily   dapagliflozin propanediol  10 mg Oral QAC breakfast   insulin aspart  0-15 Units Subcutaneous TID AC & HS   ivabradine  7.5 mg Oral BID WC   multivitamin with minerals  1 tablet Oral Daily  potassium chloride SA  20 mEq Oral Daily   sodium chloride flush  10-40 mL Intracatheter Q12H   spironolactone  25 mg Oral Daily    Infusions:  milrinone 0.25 mcg/kg/min (07/09/22 2033)     Patient Profile   Maria Coin is a 61 y.o. female with a hx of PAF,  previous smoker quit 2015  years ago, hypertension, previous small cell lung cancer treated with chemo, chest XRT and prophylactic brain radiation in 2015, CAD s/p CABG and chronic systolic HF EF ~55% seen today for acute on chronic systolic heart failure.   Assessment/Plan   1. Acute/Chronic Systolic Heart Failure Due to ICM  - Echo 09/04/18 ([facility]) LVEF 20%, Moderate AI, Mild MR, Mild TR, Severe LAE, RV mildly decreased.  - Echo 02/12/19: EF 25-30%, RV mildly reduced - Echo 11/21 EF 20-25% RV mild HK. - Echo 2022 EF < 20% RV normal - Echo 1/23 EF 20-25% RV ok  - Has [employer] single chamber ICD  - Presented with NYHA class IV symptoms. BNP 2223, HsTrop 37>>70, lactic acid 4.8 - Volume status looks elevated, JVD to jaw. CVP 13, co-ox 89%, will repeat - Give Lasix 22m IV - Echo today pending - Lactic acid elevated, milrinone started yesterday .25 per cardiology. If repeat co-ox tomorrow >55% consider decreasing to .125  - Continue Farxiga 10 mg daily.  - Continue 253m potassium chloride daily - Continue spiro 25 mg daily. - Continue Corlanor 7.5 mg bid - Failed Entresto due to hyperkalemia.   - No  b-blocker due to intolerance with low output and bad COPD with frequent flares - Not a candidate for advanced therapies with her degree of lung disease.   - Now with Barostim in place. - PICC placed, daily co-ox and CVP Qshift.  - Strict I&O and daily weights   2. Acute / Chronic Hypoxic Respiratory Failure  - Multifactorial, former smoker, h/o small cell lung cancer treated with chemo, chest XRT, COPD and HF - PCXR: Cardiac enlargement. Emphysematous changes in the lungs. No evidence of active pulmonary disease. - Lactic acid 3.6>>4.8. Pending repeat. Concern for PNA.  - Procal pending - Resp panel (-), WBC slightly elevated 13.1, pending blood cultures.   3. CAD  - Hx of NSTEMI/STEMI s/p Emergent CABG 02/03/18: Cath with severe 2v CAD as above with severe LV dysfunction/ICM. s/p Emergent CABG 02/03/18.   - Had NSTEMI and now s/p LHC 02/12/19 with DES to the ostial ramus extending into the distal left main.  - No longer on brillinta or ASA due to bleeding and need [facility].   4. COPD - On [place]. Some SOB 2/2 CHF exacerbation - Last flare treated with Doxy, prednisone and nebs    5. Recurrent pleural effusions s/p pleurodesis - Resolved. Recent CXR ok   6. PAF - CHA2DS2/VASc is at least 4. (CHF, Vasc disease, HTN, Female).   - She has history of Afib RVR in the past in the setting of her Lung CA.  - Previously on Xarelto but stopped due to hemoptysis.  - Started on Eliquis in 12/22 due to renal infarct. - Continue Eliquis 2.5 mg bid. No bleeding issues.   7. 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 - Back on Eliquis  - Baseline creatinine 1.2-1.4. Today 1.52    8. H/o SCLC:  - s/p treatment 2015. Lost to f/u since 04/2015.  - Chest CT 02/19/18 with mass-like consolidation in R hilum concerning for recurrent  tumor.  - Repeat CT 03/27/2018 -No definite findings of locally recurrent tumor in the right lung. 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 Continued chest CT surveillance is advised in 3-6 months. - S/p thoracentesis 04/2018. Cytology negative for malignancy.  - CTA 11/19 stable scarring in the right hilum consistent with emphysema. Bronchoscopy on 11/15, no obvious source of bleeding   9. S/p Barostim Implant - Site healed  10. Lactic acidosis - Could be s/s low output. Concern for possible PNA.  - repeat LA - Procal pending - Resp panel (-), WBC slightly elevated 13.1, pending blood cultures.   Length of Stay: [place], AGACNP-BC  07/10/2022, 8:05 AM  Advanced Heart Failure Team Pager [phone] (M-F; 7a - 5p)  Please contact [facility] for night-coverage after hours (4p -7a ) and weekends on [email or web address]  Patient seen with NP, agree with the above note.   She was admitted with worsening dyspnea. CXR with emphysema.  Lactate initially elevated to 4.7.  She was started empirically on milrinone 0.25 yesterday (she did not get a co-ox before it was started).  Today, co-ox is 82% on milrinone 0.25. She diuresed well this morning with Lasix 60 mg IV x 1 and CVP is actually down to 5 on my check this afternoon. Lactate normal today at 1.8.  PCT 0.1.  She is still short of breath with cough, reports some wheezing.   General: NAD, frail Neck: No JVD, no thyromegaly or thyroid nodule.  Lungs: Distant BS CV: Lateral PMI.  Heart regular S1/S2, no S3/S4, no murmur.  No peripheral edema.  No carotid bruit.  Difficult to palpate pedal pulses.  Abdomen: Soft, nontender, no hepatosplenomegaly, no distention.  Skin: Intact without lesions or rashes.  Neurologic: Alert and oriented x 3.  Psych: Normal affect. Extremities: No clubbing or cyanosis.  HEENT: Normal.   Concern for low output HF with elevated lactate yesterday.  She did not get a pre-milrinone co-ox.  Today, co-ox 82% with lactate normal.  CVP down to 5, excellent diuresis with  1 dose of IV Lasix today.  - Will continue milrinone 0.25 today, aim to start weaning tomorrow.  - With optimized volume status at this point, would aim to start her on torsemide 60 mg daily tomorrow (higher than prior home dose).   - Continue Farxiga, spironolactone.  - If creatinine stabilizes, would add digoxin.  - Not candidate for advanced therapies with lung disease.   She is coughing and wheezy.  She has significant COPD and uses home oxygen at times.  Recently treated earlier this month for COPD exacerbation, now off steroids.  Dyspnea seems out of proportion to volume overload.  Would treat for COPD exacerbation. Of note, no infiltrate on CXR and PCT 0.1.  - Continue nebs.  - Prednisone taper, start 40 mg daily.  - doxycycline 100 mg bid x 7 days.   Loralie Champagne 07/10/2022.

## 2022-07-11 DIAGNOSIS — I5041 Acute combined systolic (congestive) and diastolic (congestive) heart failure: Secondary | ICD-10-CM | POA: Diagnosis not present

## 2022-07-11 LAB — GLUCOSE, CAPILLARY
Glucose-Capillary: 100 mg/dL — ABNORMAL HIGH (ref 70–99)
Glucose-Capillary: 180 mg/dL — ABNORMAL HIGH (ref 70–99)
Glucose-Capillary: 244 mg/dL — ABNORMAL HIGH (ref 70–99)
Glucose-Capillary: 164 mg/dL — ABNORMAL HIGH (ref 70–99)
Glucose-Capillary: 274 mg/dL — ABNORMAL HIGH (ref 70–99)

## 2022-07-11 LAB — BASIC METABOLIC PANEL
Anion gap: 6 (ref 5–15)
BUN: 33 mg/dL — ABNORMAL HIGH (ref 8–23)
CO2: 25 mmol/L (ref 22–32)
Calcium: 9.4 mg/dL (ref 8.9–10.3)
Chloride: 105 mmol/L (ref 98–111)
Creatinine, Ser: 1.29 mg/dL — ABNORMAL HIGH (ref 0.44–1.00)
GFR, Estimated: 47 mL/min — ABNORMAL LOW (ref >60)
Glucose, Bld: 105 mg/dL — ABNORMAL HIGH (ref 70–99)
Potassium: 4.1 mmol/L (ref 3.5–5.1)
Sodium: 136 mmol/L (ref 135–145)

## 2022-07-11 LAB — COOXEMETRY PANEL
Carboxyhemoglobin: 1.6 % — ABNORMAL HIGH (ref 0.5–1.5)
Carboxyhemoglobin: 1.5 % (ref 0.5–1.5)
Methemoglobin: <0.7 % (ref 0.0–1.5)
Methemoglobin: <0.7 % (ref 0.0–1.5)
O2 Saturation: 71.2 %
O2 Saturation: 68.8 %
Total hemoglobin: 12.9 g/dL (ref 12.0–16.0)
Total hemoglobin: 13 g/dL (ref 12.0–16.0)

## 2022-07-11 LAB — CBC
HCT: 39.4 % (ref 36.0–46.0)
Hemoglobin: 12.3 g/dL (ref 12.0–15.0)
MCH: 29.5 pg (ref 26.0–34.0)
MCHC: 31.2 g/dL (ref 30.0–36.0)
MCV: 94.5 fL (ref 80.0–100.0)
Platelets: 132 10*3/uL — ABNORMAL LOW (ref 150–400)
RBC: 4.17 MIL/uL (ref 3.87–5.11)
RDW: 19.3 % — ABNORMAL HIGH (ref 11.5–15.5)
WBC: 8.3 10*3/uL (ref 4.0–10.5)
nRBC: 0 % (ref 0.0–0.2)

## 2022-07-11 MED ORDER — DIGOXIN 125 MCG PO TABS
0.1250 mg | ORAL_TABLET | Freq: Every day | ORAL | Status: DC
  Administered 2022-07-11 – 2022-07-12 (×2): 0.125 mg via ORAL
  Filled 2022-07-11 (×2): qty 1

## 2022-07-11 MED ORDER — TORSEMIDE 20 MG PO TABS
60.0000 mg | ORAL_TABLET | Freq: Every day | ORAL | Status: DC
  Administered 2022-07-11 – 2022-07-12 (×2): 60 mg via ORAL
  Filled 2022-07-11 (×2): qty 3

## 2022-07-11 MED ORDER — MILRINONE LACTATE IN DEXTROSE 20-5 MG/100ML-% IV SOLN
0.1250 ug/kg/min | INTRAVENOUS | Status: AC
  Administered 2022-07-11: 0.125 ug/kg/min via INTRAVENOUS

## 2022-07-11 NOTE — Plan of Care (Signed)
  Problem: Activity: Goal: Ability to tolerate increased activity will improve Outcome: Progressing   Problem: Clinical Measurements: Goal: Respiratory complications will improve Outcome: Progressing   Problem: Coping: Goal: Level of anxiety will decrease Outcome: Progressing   Problem: Elimination: Goal: Will not experience complications related to urinary retention Outcome: Progressing   Problem: Safety: Goal: Ability to remain free from injury will improve Outcome: Progressing

## 2022-07-11 NOTE — Plan of Care (Signed)
  Problem: Activity: Goal: Ability to tolerate increased activity will improve Outcome: Progressing   Problem: Cardiac: Goal: Ability to achieve and maintain adequate cardiovascular perfusion will improve Outcome: Progressing   Problem: Coping: Goal: Ability to adjust to condition or change in health will improve Outcome: Progressing

## 2022-07-11 NOTE — TOC Initial Note (Addendum)
Transition of Care [facility]) - Initial/Assessment Note    Patient Details  Name: Brittany [name] MRN: [id number] Date of Birth: [birth year]  Transition of Care [facility]) CM/SW Contact:    Erenest Rasher, RN Phone Number: [phone] 07/11/2022, 11:52 AM  Clinical Narrative:                  HF TOC CM spoke to pt at bedside. States she will have her SO bring oxygen portable to use at dc. She has scale, Rollator and oxygen ([employer]) at home. She drives to her appts. Discussed with pt low sodium diet and importance of adhering to help reduce increase in fluid. She verbalized understanding and will monitor sodium intake.   States her breathing is difficult at night. Pt was placed on Bipap this admission. Will verify with attending if Bipap/NIV needed for home.   Faxed orders to [employer] for NIV at home. Spoke to [employer], [place].   [employer] will deliver to pt once insurance auth complete for NIV.   Expected Discharge Plan: Home/Self Care Barriers to Discharge: Continued Medical Work up   Patient Goals and CMS Choice Patient states their goals for this hospitalization and ongoing recovery are:: patient states she wants to be able to maintain her independence CMS Medicare.gov Compare Post Acute Care list provided to:: Patient    Expected Discharge Plan and Services Expected Discharge Plan: Home/Self Care   Discharge Planning Services: CM Consult   Living arrangements for the past 2 months: Hotel/Motel                                      Prior Living Arrangements/Services Living arrangements for the past 2 months: Hotel/Motel Lives with:: Self Patient language and need for interpreter reviewed:: Yes        Need for Family Participation in Patient Care: No (Comment) Care giver support system in place?: No (comment) Current home services: DME (rollator, oxygen ([employer])) Criminal Activity/Legal Involvement Pertinent to Current Situation/Hospitalization: No -  Comment as needed  Activities of Daily Living Home Assistive Devices/Equipment: None ADL Screening (condition at time of admission) Patient's cognitive ability adequate to safely complete daily activities?: Yes Is the patient deaf or have difficulty hearing?: No Does the patient have difficulty seeing, even when wearing glasses/contacts?: No Does the patient have difficulty concentrating, remembering, or making decisions?: No Patient able to express need for assistance with ADLs?: Yes Does the patient have difficulty dressing or bathing?: No Independently performs ADLs?: Yes (appropriate for developmental age) Does the patient have difficulty walking or climbing stairs?: No Weakness of Legs: None Weakness of Arms/Hands: None  Permission Sought/Granted Permission sought to share information with : Case Manager, Family Supports, PCP Permission granted to share information with : Yes, Verbal Permission Granted  Share Information with NAME: Anice Paganini     Permission granted to share info w Relationship: SO  Permission granted to share info w Contact Information: [phone]  Emotional Assessment Appearance:: Appears stated age Attitude/Demeanor/Rapport: Engaged Affect (typically observed): Accepting Orientation: : Oriented to Self, Oriented to Place, Oriented to  Time, Oriented to Situation   Psych Involvement: No (comment)  Admission diagnosis:  Acute combined systolic and diastolic CHF, NYHA class 4 (HCC) [I50.41] Severe comorbid illness [R69] Acute on chronic respiratory failure, unspecified whether with hypoxia or hypercapnia ([place]) [J96.20] Patient Active Problem List   Diagnosis Date Noted   Acute  combined systolic and diastolic CHF, NYHA class 4 (HCC) 07/09/2022   Malnutrition of moderate degree 06/20/2022   Lactic acidosis 04/12/2022   Prolonged QT interval 04/09/2022   Hypotension 04/09/2022   Chronic HFrEF (heart failure with reduced ejection fraction) ([place])  12/11/2021   CHF exacerbation ([place]) 12/10/2021   Renal infarct ([place]) 12/10/2021   COPD exacerbation (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   NSTEMI (non-ST elevated myocardial infarction) ([place]) 02/12/2019   CHF (congestive heart failure) ([place]) 11/24/2018   Recurrent pleural effusion on right 05/05/2018   Acute on chronic respiratory failure with hypoxia ([place]) 05/05/2018   Chronic respiratory failure with hypoxia (HCC)    S/P CABG (coronary artery bypass graft) 02/03/2018   Asthma    Anxiety    Allergy    HCAP (healthcare-associated pneumonia) 03/15/2015   Chest pain 03/15/2015   Small cell lung cancer ([place]) 03/16/2014   Protein-calorie malnutrition, severe ([place]) 03/14/2014   AF (paroxysmal atrial fibrillation) ([place]) 03/12/2014   Essential hypertension 05/07/2012   PCP:  Traci Sermon, PA-C Pharmacy:   [id number] [place], [address] [address] Phone: [phone] Fax: [phone]  [facility] [address] [place] [address] Phone: [phone] Fax: [phone]     Social Determinants of Health (SDOH) Interventions    Readmission Risk Interventions    06/20/2022   10:00 AM 04/11/2021   10:16 AM  Readmission Risk Prevention Plan  Transportation Screening Complete Complete  PCP or Specialist Appt within 3-5 Days Complete Complete  HRI or Home Care Consult  Complete Complete  Social Work Consult for [place] Planning/Counseling Complete Complete  Palliative Care Screening Not Applicable Not Applicable  Medication Review Press photographer) Complete Complete

## 2022-07-11 NOTE — Plan of Care (Signed)
  Problem: Activity: Goal: Ability to tolerate increased activity will improve Outcome: Progressing   Problem: Coping: Goal: Level of anxiety will decrease Outcome: Progressing   Problem: Elimination: Goal: Will not experience complications related to urinary retention Outcome: Progressing

## 2022-07-11 NOTE — Evaluation (Signed)
Physical Therapy Evaluation Patient Details Name: Brittany [name] MRN: [id number] DOB: 1961/05/17 Today's Date: 07/11/2022  History of Present Illness  Pt is a 61 y/o female admitted secondary to SOB and respiratory failure. PMH includes CHF, COPD, a fib, lung cancer, CAD s/p CABG, HTN.  Clinical Impression  Pt admitted secondary to problem above with deficits below. Pt cautious during gait, and requiring min guard A. Wanting to hold to external support out of caution. No overt LOB. Anticipate pt will progress well and will not require follow up PT. Will continue to follow acutely to maximize functional mobility independence and safety.         Recommendations for follow up therapy are one component of a multi-disciplinary discharge planning process, led by the attending physician.  Recommendations may be updated based on patient status, additional functional criteria and insurance authorization.  Follow Up Recommendations No PT follow up      Assistance Recommended at Discharge PRN  Patient can return home with the following  Help with stairs or ramp for entrance;Assistance with cooking/housework    Equipment Recommendations None recommended by PT  Recommendations for Other Services       Functional Status Assessment Patient has had a recent decline in their functional status and demonstrates the ability to make significant improvements in function in a reasonable and predictable amount of time.     Precautions / Restrictions Precautions Precautions: Fall Restrictions Weight Bearing Restrictions: No      Mobility  Bed Mobility Overal bed mobility: Independent                  Transfers Overall transfer level: Independent                      Ambulation/Gait Ambulation/Gait assistance: Min guard Gait Distance (Feet): 120 Feet Assistive device: IV Pole Gait Pattern/deviations: Step-through pattern, Decreased stride length Gait velocity:  Decreased     General Gait Details: Min guard for safety. Felt she needed to hold to something for increased stability.  Stairs            Wheelchair Mobility    Modified Rankin (Stroke Patients Only)       Balance Overall balance assessment: Mild deficits observed, not formally tested                                           Pertinent Vitals/Pain Pain Assessment Pain Assessment: Faces Faces Pain Scale: Hurts even more Pain Location: abdomen and R groin Pain Descriptors / Indicators: Grimacing, Guarding Pain Intervention(s): Limited activity within patient's tolerance, Monitored during session, Repositioned    Home Living Family/patient expects to be discharged to:: Private residence Living Arrangements: Spouse/significant other Available Help at Discharge: Friend(s);Available PRN/intermittently Type of Home: House Home Access: Level entry       Home Layout: Two level;Able to live on main level with bedroom/bathroom Home Equipment: Rollator (4 wheels)      Prior Function Prior Level of Function : Independent/Modified Independent;Driving             Mobility Comments: Reports she was very active before getting sick       Hand Dominance        Extremity/Trunk Assessment   Upper Extremity Assessment Upper Extremity Assessment: Overall WFL for tasks assessed    Lower Extremity Assessment Lower Extremity Assessment: Generalized weakness  Communication   Communication: No difficulties  Cognition Arousal/Alertness: Awake/alert Behavior During Therapy: WFL for tasks assessed/performed Overall Cognitive Status: Within Functional Limits for tasks assessed                                          General Comments      Exercises     Assessment/Plan    PT Assessment Patient needs continued PT services  PT Problem List Decreased strength;Decreased activity tolerance;Decreased balance;Decreased  mobility       PT Treatment Interventions DME instruction;Gait training;Stair training;Functional mobility training;Therapeutic activities;Therapeutic exercise;Patient/family education    PT Goals (Current goals can be found in the Care Plan section)  Acute Rehab PT Goals Patient Stated Goal: to go home PT Goal Formulation: With patient Time For Goal Achievement: 07/25/22 Potential to Achieve Goals: Good    Frequency Min 2X/week     Co-evaluation               AM-PAC PT "6 Clicks" Mobility  Outcome Measure Help needed turning from your back to your side while in a flat bed without using bedrails?: None Help needed moving from lying on your back to sitting on the side of a flat bed without using bedrails?: None Help needed moving to and from a bed to a chair (including a wheelchair)?: None Help needed standing up from a chair using your arms (e.g., wheelchair or bedside chair)?: None Help needed to walk in hospital room?: A Little Help needed climbing 3-5 steps with a railing? : A Little 6 Click Score: 22    End of Session   Activity Tolerance: Patient limited by fatigue Patient left: in bed;with call bell/phone within reach Nurse Communication: Mobility status PT Visit Diagnosis: Difficulty in walking, not elsewhere classified (R26.2)    Time: 2202-5427 PT Time Calculation (min) (ACUTE ONLY): 21 min   Charges:   PT Evaluation $PT Eval Moderate Complexity: 1 Mod          Reuel Derby, PT, DPT  Acute Rehabilitation Services  Office: [phone]   Rudean Hitt 07/11/2022, 4:45 PM

## 2022-07-11 NOTE — Progress Notes (Addendum)
Advanced Heart Failure Rounding Note  PCP-Cardiologist: None   Subjective:     Echo yesterday: EF 20-25%, global hypokinesis, GIIDD, RV systolic function, mildly reduced. RV size normal. Mild MR, trivial TR, aortic valve w/ mild-mod regurg.   Concern for low output vs PNA vs COPD exacerbation, PICC placed. Milrinone gtt   started empirically by cardiology. CVP 13>> 5>>7. Co-ox 89>>82>>71% today. WBC 13.2>> 8.3 today. Procal .10, Lactic acid 3.6>>4.8>>1.1. r/o PNA. Started prednisone taper and doxy yesterday for COPD exacerbation.   Today feels much better. Breathing has significantly improved. Coughing up clear sputum. Denies CP.   Objective:   Weight Range: 46.5 kg Body mass index is 20.71 kg/m.   Vital Signs:   Temp:  [97.8 F (36.6 C)-98.7 F (37.1 C)] 97.8 F (36.6 C) (08/01 0828) Pulse Rate:  [72-89] 85 (08/01 0828) Resp:  [14-19] 15 (08/01 0828) BP: (94-119)/(43-57) 94/45 (08/01 0828) SpO2:  [98 %-100 %] 98 % (08/01 0828) Weight:  [46.5 kg] 46.5 kg (08/01 0500) Last BM Date : 07/09/22  Weight change: Filed Weights   07/09/22 1724 07/10/22 0420 07/11/22 0500  Weight: 46.4 kg 47.5 kg 46.5 kg    Intake/Output:   Intake/Output Summary (Last 24 hours) at 07/11/2022 0849 Last data filed at 07/11/2022 0534 Gross per 24 hour  Intake 1138.83 ml  Output 2000 ml  Net -861.17 ml     CVP 7 Physical Exam  General:  chronically ill appearing. No respiratory difficulty HEENT: normal Neck: supple. JVD ~8 cm. Carotids 2+ bilat; no bruits. No lymphadenopathy or thyromegaly appreciated. Cor: PMI nondisplaced. Regular rate & rhythm. No rubs, gallops or murmurs. Lungs: coarse crackles BUL , rhonchi BLL Abdomen: soft, tender R lower flank, nondistended. No hepatosplenomegaly. No bruits or masses. Good bowel sounds. Extremities: no cyanosis, clubbing, rash, edema. PICC RUE Neuro: alert & oriented x 3, cranial nerves grossly intact. moves all 4 extremities w/o difficulty. Affect  pleasant.   Telemetry   NSR in 70's (Personally reviewed)    EKG    No new EKG to review  Labs    CBC Recent Labs    07/09/22 0800 07/09/22 0828 07/10/22 1040 07/11/22 0627  WBC 13.1*  --  13.2* 8.3  NEUTROABS 8.8*  --   --   --   HGB 14.0   < > 11.9* 12.3  HCT 45.3   < > 36.8 39.4  MCV 96.8  --  93.4 94.5  PLT 186  --  133* 132*   < > = values in this interval not displayed.   Basic Metabolic Panel Recent Labs    07/10/22 0444 07/11/22 0627  NA 135 136  K 4.4 4.1  CL 104 105  CO2 24 25  GLUCOSE 106* 105*  BUN 32* 33*  CREATININE 1.52* 1.29*  CALCIUM 9.3 9.4   Liver Function Tests Recent Labs    07/09/22 0800  AST 27  ALT 14  ALKPHOS 75  BILITOT 1.0  PROT 7.3  ALBUMIN 3.6   No results for input(s): "LIPASE", "AMYLASE" in the last 72 hours. Cardiac Enzymes No results for input(s): "CKTOTAL", "CKMB", "CKMBINDEX", "TROPONINI" in the last 72 hours.  BNP: BNP (last 3 results) Recent Labs    04/25/22 1244 06/17/22 1922 07/09/22 0800  BNP 1,232.9* 862.1* 2,223.4*    ProBNP (last 3 results) Recent Labs    07/19/21 0945  PROBNP 3,162*     D-Dimer No results for input(s): "DDIMER" in the last 72 hours. Hemoglobin A1C No  results for input(s): "HGBA1C" in the last 72 hours. Fasting Lipid Panel No results for input(s): "CHOL", "HDL", "LDLCALC", "TRIG", "CHOLHDL", "LDLDIRECT" in the last 72 hours. Thyroid Function Tests No results for input(s): "TSH", "T4TOTAL", "T3FREE", "THYROIDAB" in the last 72 hours.  Invalid input(s): "FREET3"  Other results:   Imaging    ECHOCARDIOGRAM COMPLETE  Result Date: 07/10/2022    ECHOCARDIOGRAM REPORT   Patient Name:   Brittany [name] Date of Exam: 07/10/2022 Medical Rec #:  [id number]               Height:       59.0 in Accession #:    [id number]              Weight:       104.7 lb Date of Birth:  62-08-1961               BSA:          1.401 m Patient Age:    61 years                BP:            104/51 mmHg Patient Gender: F                       HR:           83 bpm. Exam Location:  [place] Procedure: 2D Echo, Cardiac Doppler and Color Doppler Indications:    CHF- Acute Systolic B28.41  History:        Patient has prior history of Echocardiogram examinations, most                 recent 12/11/2021. CHF and Cardiomyopathy, Angina, Prior CABG and                 Pacemaker, COPD, Arrythmias:Atrial Fibrillation,                 Signs/Symptoms:Hypotension; Risk Factors:Hypertension, Diabetes                 and Dyslipidemia.  Sonographer:    Ronny Flurry Sonographer#2:  Meagan Baucom RDCS, FE, PE Referring Phys: [place]  1. Left ventricular ejection fraction, by estimation, is 20 to 25%. The left ventricle has severely decreased function. There is global hypokinesis with akinesis of the mid-to-apical septal, mid-to-apical inferior, all apical segments and apex. The left  ventricular internal cavity size was moderately dilated. Left ventricular diastolic parameters are consistent with Grade II diastolic dysfunction (pseudonormalization). Elevated left atrial pressure.  2. Right ventricular systolic function is mildly reduced. The right ventricular size is normal. Tricuspid regurgitation signal is inadequate for assessing PA pressure.  3. Left atrial size was severely dilated.  4. The mitral valve is abnormal. Mild mitral valve regurgitation.  5. The aortic valve is tricuspid. There is mild calcification of the aortic valve. There is mild thickening of the aortic valve. Aortic valve regurgitation is mild to moderate.  6. The inferior vena cava is normal in size with greater than 50% respiratory variability, suggesting right atrial pressure of 3 mmHg. Comparison(s): Compared to prior TTE on 12/11/2021, there is no signficant change. FINDINGS  Left Ventricle: There is global hypokinesis with akinesis of the mid-to-apical inferoseptal, mid-to-apical anteroseptal, mid-to-apical  inferior, and all apical LV segments. Left ventricular ejection fraction, by estimation, is 20 to 25%. The left ventricle has severely decreased function. The left ventricle demonstrates regional wall motion  abnormalities. The left ventricular internal cavity size was moderately dilated. There is no left ventricular hypertrophy. Left ventricular diastolic parameters are consistent with Grade II diastolic dysfunction (pseudonormalization). Elevated left atrial pressure. Right Ventricle: The right ventricular size is normal. No increase in right ventricular wall thickness. Right ventricular systolic function is mildly reduced. Tricuspid regurgitation signal is inadequate for assessing PA pressure. Left Atrium: Left atrial size was severely dilated. Right Atrium: Right atrial size was normal in size. Pericardium: There is no evidence of pericardial effusion. Mitral Valve: The mitral valve is abnormal. There is mild thickening of the mitral valve leaflet(s). There is mild calcification of the mitral valve leaflet(s). Mild to moderate mitral annular calcification. Mild mitral valve regurgitation. Tricuspid Valve: The tricuspid valve is normal in structure. Tricuspid valve regurgitation is trivial. Aortic Valve: The aortic valve is tricuspid. There is mild calcification of the aortic valve. There is mild thickening of the aortic valve. Aortic valve regurgitation is mild to moderate. Pulmonic Valve: The pulmonic valve was normal in structure. Pulmonic valve regurgitation is trivial. Aorta: The aortic root and ascending aorta are structurally normal, with no evidence of dilitation. Venous: The inferior vena cava is normal in size with greater than 50% respiratory variability, suggesting right atrial pressure of 3 mmHg. IAS/Shunts: There is right bowing of the interatrial septum, suggestive of elevated left atrial pressure. The atrial septum is grossly normal. Additional Comments: A device lead is visualized.  LEFT VENTRICLE  PLAX 2D LVIDd:         5.70 cm   Diastology LVIDs:         4.90 cm   LV e' medial:    3.31 cm/s LV PW:         1.10 cm   LV E/e' medial:  24.5 LV IVS:        0.70 cm   LV e' lateral:   7.15 cm/s LVOT diam:     1.90 cm   LV E/e' lateral: 11.3 LV SV:         68 LV SV Index:   49 LVOT Area:     2.84 cm  RIGHT VENTRICLE RV S prime:     6.50 cm/s TAPSE (M-mode): 1.0 cm LEFT ATRIUM             Index        RIGHT ATRIUM           Index LA diam:        4.10 cm 2.93 cm/m   RA Area:     11.40 cm LA Vol (A2C):   64.5 ml 46.04 ml/m  RA Volume:   25.10 ml  17.92 ml/m LA Vol (A4C):   60.1 ml 42.90 ml/m LA Biplane Vol: 65.9 ml 47.04 ml/m  AORTIC VALVE LVOT Vmax:   139.00 cm/s LVOT Vmean:  83.200 cm/s LVOT VTI:    0.241 m  AORTA Ao Asc diam: 2.50 cm MITRAL VALVE MV Area (PHT): 3.79 cm    SHUNTS MV Decel Time: 200 msec    Systemic VTI:  0.24 m MV E velocity: 81.00 cm/s  Systemic Diam: 1.90 cm MV A velocity: 38.60 cm/s MV E/A ratio:  2.10 Gwyndolyn Kaufman MD Electronically signed by Gwyndolyn Kaufman MD Signature Date/Time: 07/10/2022/12:51:57 PM    Final      Medications:     Scheduled Medications:  apixaban  2.5 mg Oral BID   atorvastatin  80 mg Oral q1800   Chlorhexidine Gluconate Cloth  6 each Topical Daily  dapagliflozin propanediol  10 mg Oral QAC breakfast   doxycycline  100 mg Oral Q12H   insulin aspart  0-15 Units Subcutaneous TID AC & HS   ivabradine  7.5 mg Oral BID WC   multivitamin with minerals  1 tablet Oral Daily   potassium chloride SA  20 mEq Oral Daily   predniSONE  40 mg Oral Q breakfast   sodium chloride flush  10-40 mL Intracatheter Q12H   spironolactone  25 mg Oral Daily    Infusions:  milrinone 0.25 mcg/kg/min (07/10/22 2328)    PRN Medications: acetaminophen, albuterol, ALPRAZolam, guaiFENesin-dextromethorphan, ipratropium-albuterol, naphazoline-pheniramine, nitroGLYCERIN, ondansetron (ZOFRAN) IV, sodium chloride flush, zolpidem    Patient Profile   Brittany [name] is a 61 y.o. 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   Assessment/Plan   1. Acute/Chronic Systolic Heart Failure Due to ICM  - Echo 09/04/18 ([facility]) LVEF 20%, Moderate AI, Mild MR, Mild TR, Severe LAE, RV mildly decreased.  - Echo 02/12/19: EF 25-30%, RV mildly reduced - Echo 11/21 EF 20-25% RV mild HK. - Echo 2022 EF < 20% RV normal - Echo 1/23 EF 20-25% RV ok  - Has [employer] single chamber ICD  - Presented with NYHA class IV symptoms. BNP 2223, HsTrop 37>>70, lactic acid 4.8 - Echo 07/10/22: EF 20-25%, global hypokinesis, GIIDD, RV systolic function, mildly reduced. RV size normal. Mild MR, trivial TR, aortic valve w/ mild-mod regurg.  - Volume status looks better. CVP 7, co-ox 71% - Start torsemide 60mg  daily.  - Lactic acid elevated, milrinone started empirically per cardiology - Co-ox 71%, ok to wean milrinone gtt. .25>>.146mcg/kg/hr  - Start Digoxin .125mg  daily - Continue Farxiga 10 mg daily.  - Continue 23mEq potassium chloride daily - Continue spiro 25 mg daily. - Continue Corlanor 7.5 mg bid - Failed Entresto due to hyperkalemia.   - No b-blocker due to intolerance with low output and bad COPD with frequent flares - Not a candidate for advanced therapies with her degree of lung disease.   - Now with Barostim in place. - PICC placed yesterday, daily co-ox and CVP Qshift.  - Strict I&O and daily weights    2. Acute / Chronic Hypoxic Respiratory Failure  - Multifactorial, former smoker, h/o small cell lung cancer treated with chemo, chest XRT, COPD and HF - PCXR: Cardiac enlargement. Emphysematous changes in the lungs. No evidence of active pulmonary disease. - Lactic acid 3.6>>4.8>>1.8>>1.1 - Procal .10 - Resp panel (-),  WBC slightly elevated 13.1>>8.3 today, blood cultures NGTD.    3. CAD  - Hx of NSTEMI/STEMI s/p Emergent CABG 02/03/18: Cath with severe 2v CAD as above with severe LV dysfunction/ICM. s/p Emergent CABG 02/03/18.   - Had NSTEMI and now s/p LHC 02/12/19 with DES to the ostial ramus extending into the distal left main.  - No longer on brillinta or ASA due to bleeding and need for [facility] -Continue hi-intensity statin.   4. COPD w/ exacerbation - On [place]. Some SOB 2/2 CHF exacerbation - Last flare 5/23 treated with Doxy, prednisone and nebs  - Continue nebs - Continue Prednisone taper, starting w/ 40 mg daily.  - Continue doxycycline 100 mg bid x 7 days.    5. Recurrent pleural effusions s/p pleurodesis -  Resolved. Recent CXR ok   6. PAF - CHA2DS2/VASc is at least 4. (CHF, Vasc disease, HTN, Female).   - She has history of Afib RVR in the past in the setting of her Lung CA.  - Previously on Xarelto but stopped due to hemoptysis.  - Started on Eliquis in 12/22 due to renal infarct. - Continue Eliquis 2.5 mg bid. No bleeding issues.   7. 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 - Back on Eliquis  - Baseline creatinine 1.2-1.4. Today 1.29    8. H/o SCLC:  - s/p treatment 2015. Lost to f/u since 04/2015.  - Chest CT 02/19/18 with mass-like consolidation in R hilum concerning for recurrent tumor.  - Repeat CT 03/27/2018 -No definite findings of locally recurrent tumor in the right lung. 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 Continued chest CT surveillance is advised in 3-6 months. - S/p thoracentesis 04/2018. Cytology negative for malignancy.  - CTA 11/19 stable scarring in the right hilum consistent with emphysema. Bronchoscopy on 11/15, no obvious source of bleeding   9. S/p Barostim Implant - Site healed   10. Lactic acidosis - resolved. 3.6>>4.8>>1.8>>1.1 - Procal .10  - Resp panel (-), WBC  slightly elevated 13.1>>8.3, blood cultures NGTD.   Length of Stay: [place], AGACNP-BC 07/11/2022, 8:49 AM  Advanced Heart Failure Team Pager [phone] (M-F; 7a - 5p)  Please contact [facility] for night-coverage after hours (5p -7a ) and weekends on [email or web address]  Patient seen with NP, agree with the above note.   She feels much better today.  Less wheezing.  She is on milrinone 0.25 with co-ox 71% and CVP 7.    General: NAD Neck: No JVD, no thyromegaly or thyroid nodule.  Lungs: Sound better, occasional rhonchi.  CV: Nondisplaced PMI.  Heart regular S1/S2, no S3/S4, no murmur.  No peripheral edema.  Abdomen: Soft, nontender, no hepatosplenomegaly, no distention.  Skin: Intact without lesions or rashes.  Neurologic: Alert and oriented x 3.  Psych: Normal affect. Extremities: No clubbing or cyanosis.  HEENT: Normal.   Decrease milrinone to 0.125 today and add digoxin 0.125.  Can restart torsemide at higher dose, 60 mg daily.  Can stop milrinone later today.   Continue prednisone and doxycycline for now for COPD exacerbation, lung exam improved.   Loralie Champagne 07/11/2022 1:01 PM

## 2022-07-11 NOTE — TOC CM/SW Note (Signed)
[name] is a 61 y/o female with Acute on Chronic respiratory hypercarbia Failure, CHF and COPD . Patient presented to ED with complaints of sob and respiratory failure. Upon admission (07/09/2022), patient was placed on Bipap ST/Avaps  and failed due to hypercapnia and COPD. Patient had a ABG test and failed on 07/09/22 with a PCO2 OF 55.2. Due to patient's condition, she is at risk of worsening of Respiratory failure and COPD. Patient has had numerous readmissions in the last 2 months with issues involving respiratory failure. Bipap ST/Avaps has been tried and failed, considered and ruled out however; patient will require volume ventilation to promote gas exchange via NIV therapy. Patient also requires mouthpiece ventilation for daytime as the pco2 elevates during the day. Due to the severity of the patient and the need for daytime mouthpiece ventilation, back up battery, alarms and portability an NIV is required which is not supported by the bipap/rad/bipap avaps device." Patient is able to keep airway clear and clear secretions.

## 2022-07-11 NOTE — Progress Notes (Signed)
Remote ICD transmission.   

## 2022-07-12 ENCOUNTER — Other Ambulatory Visit (HOSPITAL_COMMUNITY): Payer: Self-pay

## 2022-07-12 DIAGNOSIS — I5041 Acute combined systolic (congestive) and diastolic (congestive) heart failure: Secondary | ICD-10-CM | POA: Diagnosis not present

## 2022-07-12 LAB — BASIC METABOLIC PANEL
Anion gap: 7 (ref 5–15)
BUN: 37 mg/dL — ABNORMAL HIGH (ref 8–23)
CO2: 28 mmol/L (ref 22–32)
Calcium: 9.5 mg/dL (ref 8.9–10.3)
Chloride: 101 mmol/L (ref 98–111)
Creatinine, Ser: 1.35 mg/dL — ABNORMAL HIGH (ref 0.44–1.00)
GFR, Estimated: 45 mL/min — ABNORMAL LOW (ref >60)
Glucose, Bld: 89 mg/dL (ref 70–99)
Potassium: 3.5 mmol/L (ref 3.5–5.1)
Sodium: 136 mmol/L (ref 135–145)

## 2022-07-12 LAB — CBC
HCT: 41.8 % (ref 36.0–46.0)
Hemoglobin: 13.6 g/dL (ref 12.0–15.0)
MCH: 29.9 pg (ref 26.0–34.0)
MCHC: 32.5 g/dL (ref 30.0–36.0)
MCV: 91.9 fL (ref 80.0–100.0)
Platelets: 159 10*3/uL (ref 150–400)
RBC: 4.55 MIL/uL (ref 3.87–5.11)
RDW: 19.2 % — ABNORMAL HIGH (ref 11.5–15.5)
WBC: 9.2 10*3/uL (ref 4.0–10.5)
nRBC: 0 % (ref 0.0–0.2)

## 2022-07-12 LAB — GLUCOSE, CAPILLARY
Glucose-Capillary: 108 mg/dL — ABNORMAL HIGH (ref 70–99)
Glucose-Capillary: 208 mg/dL — ABNORMAL HIGH (ref 70–99)

## 2022-07-12 LAB — COOXEMETRY PANEL
Carboxyhemoglobin: 1.5 % (ref 0.5–1.5)
Methemoglobin: 0.9 % (ref 0.0–1.5)
O2 Saturation: 73.5 %
Total hemoglobin: 11.6 g/dL — ABNORMAL LOW (ref 12.0–16.0)

## 2022-07-12 MED ORDER — IPRATROPIUM-ALBUTEROL 0.5-2.5 (3) MG/3ML IN SOLN
3.0000 mL | RESPIRATORY_TRACT | 3 refills | Status: DC | PRN
  Filled 2022-07-12: qty 90, 5d supply, fill #0

## 2022-07-12 MED ORDER — TORSEMIDE 20 MG PO TABS
60.0000 mg | ORAL_TABLET | Freq: Every day | ORAL | 5 refills | Status: DC
  Filled 2022-07-12: qty 90, 30d supply, fill #0

## 2022-07-12 MED ORDER — DIGOXIN 125 MCG PO TABS
0.1250 mg | ORAL_TABLET | Freq: Every day | ORAL | 5 refills | Status: DC
  Filled 2022-07-12: qty 30, 30d supply, fill #0

## 2022-07-12 MED ORDER — PREDNISONE 10 MG PO TABS
ORAL_TABLET | ORAL | 0 refills | Status: AC
  Filled 2022-07-12: qty 13, 7d supply, fill #0

## 2022-07-12 MED ORDER — POTASSIUM CHLORIDE CRYS ER 20 MEQ PO TBCR
40.0000 meq | EXTENDED_RELEASE_TABLET | Freq: Once | ORAL | Status: AC
  Administered 2022-07-12: 40 meq via ORAL
  Filled 2022-07-12: qty 2

## 2022-07-12 MED ORDER — DOXYCYCLINE HYCLATE 100 MG PO TABS
100.0000 mg | ORAL_TABLET | Freq: Two times a day (BID) | ORAL | 0 refills | Status: DC
  Filled 2022-07-12: qty 14, 7d supply, fill #0

## 2022-07-12 NOTE — TOC Progression Note (Addendum)
Transition of Care [facility]) - Progression Note    Patient Details  Name: Brittany [name] MRN: [id number] Date of Birth: 1961-07-04  [facility] CM/SW Contact  Zenon Mayo, RN Phone Number: 07/12/2022, 10:30 AM  Clinical Narrative:    NCM spoke with Brenton Grills with [employer], he states a rep will be here at the hospital to go over NIV  information 1 to 3 pm  prior to dc.   Expected Discharge Plan: Home/Self Care Barriers to Discharge: Continued Medical Work up  Expected Discharge Plan and Services Expected Discharge Plan: Home/Self Care   Discharge Planning Services: CM Consult   Living arrangements for the past 2 months: Hotel/Motel                                       Social Determinants of Health (SDOH) Interventions    Readmission Risk Interventions    06/20/2022   10:00 AM 04/11/2021   10:16 AM  Readmission Risk Prevention Plan  Transportation Screening Complete Complete  PCP or Specialist Appt within 3-5 Days Complete Complete  HRI or Home Care Consult Complete Complete  Social Work Consult for [place] Planning/Counseling Complete Complete  Palliative Care Screening Not Applicable Not Applicable  Medication Review [employer] photographer) Complete Complete

## 2022-07-12 NOTE — Progress Notes (Addendum)
Advanced Heart Failure Rounding Note  PCP-Cardiologist: None   Subjective:    Admitted w/ a/c respiratory failure and CHF. Concern for low output vs PNA vs COPD exacerbation, PICC placed. Milrinone gtt started empirically by cardiology. Diuresed w/ IV Lasix. PCT negative. Started prednisone taper and doxy for COPD exacerbation.   Echo this admit: EF 20-25%, global hypokinesis, GIIDD, RV systolic function, mildly reduced. RV size normal. Mild MR, trivial TR, aortic valve w/ mild-mod regurg.   Milrinone discontinued yesterday. Co-ox 74%.   SCr trend 1.52>>1.29>>1.35   CVP 4. Back on PO torsemide.   Feels much better today. Off Supp O2. Ambulated yesterday w/o exertional dyspnea. No wheezing on exam.     Objective:   Weight Range: 45.2 kg Body mass index is 20.13 kg/m.   Vital Signs:   Temp:  [97.8 F (36.6 C)-98.4 F (36.9 C)] 97.8 F (36.6 C) (08/02 0727) Pulse Rate:  [73-82] 79 (08/02 0727) Resp:  [14-19] 17 (08/02 0727) BP: (99-108)/(45-63) 104/57 (08/02 0727) SpO2:  [99 %-100 %] 99 % (08/02 0727) Weight:  [45.2 kg] 45.2 kg (08/02 0454) Last BM Date : 07/11/22  Weight change: Filed Weights   07/10/22 0420 07/11/22 0500 07/12/22 0454  Weight: 47.5 kg 46.5 kg 45.2 kg    Intake/Output:   Intake/Output Summary (Last 24 hours) at 07/12/2022 0850 Last data filed at 07/12/2022 0809 Gross per 24 hour  Intake 720 ml  Output 1400 ml  Net -680 ml      Physical Exam   CVP 4  General:  chronically ill appearing. No respiratory difficulty HEENT: normal Neck: supple. no JVD. Carotids 2+ bilat; no bruits. No lymphadenopathy or thyromegaly appreciated. Cor: PMI nondisplaced. Regular rate & rhythm. No rubs, gallops or murmurs. Lungs: clear Abdomen: soft, nontender, nondistended. No hepatosplenomegaly. No bruits or masses. Good bowel sounds. Extremities: no cyanosis, clubbing, rash, edema Neuro: alert & oriented x 3, cranial nerves grossly intact. moves all 4  extremities w/o difficulty. Affect pleasant.   Telemetry   NSR in 70's (Personally reviewed)    EKG    No new EKG to review  Labs    CBC Recent Labs    07/11/22 0627 07/12/22 0630  WBC 8.3 9.2  HGB 12.3 13.6  HCT 39.4 41.8  MCV 94.5 91.9  PLT 132* 416   Basic Metabolic Panel Recent Labs    07/11/22 0627 07/12/22 0630  NA 136 136  K 4.1 3.5  CL 105 101  CO2 25 28  GLUCOSE 105* 89  BUN 33* 37*  CREATININE 1.29* 1.35*  CALCIUM 9.4 9.5   Liver Function Tests No results for input(s): "AST", "ALT", "ALKPHOS", "BILITOT", "PROT", "ALBUMIN" in the last 72 hours.  No results for input(s): "LIPASE", "AMYLASE" in the last 72 hours. Cardiac Enzymes No results for input(s): "CKTOTAL", "CKMB", "CKMBINDEX", "TROPONINI" in the last 72 hours.  BNP: BNP (last 3 results) Recent Labs    04/25/22 1244 06/17/22 1922 07/09/22 0800  BNP 1,232.9* 862.1* 2,223.4*    ProBNP (last 3 results) Recent Labs    07/19/21 0945  PROBNP 3,162*     D-Dimer No results for input(s): "DDIMER" in the last 72 hours. Hemoglobin A1C No results for input(s): "HGBA1C" in the last 72 hours. Fasting Lipid Panel No results for input(s): "CHOL", "HDL", "LDLCALC", "TRIG", "CHOLHDL", "LDLDIRECT" in the last 72 hours. Thyroid Function Tests No results for input(s): "TSH", "T4TOTAL", "T3FREE", "THYROIDAB" in the last 72 hours.  Invalid input(s): "FREET3"  Other results:  Imaging    No results found.   Medications:     Scheduled Medications:  apixaban  2.5 mg Oral BID   atorvastatin  80 mg Oral q1800   Chlorhexidine Gluconate Cloth  6 each Topical Daily   dapagliflozin propanediol  10 mg Oral QAC breakfast   digoxin  0.125 mg Oral Daily   doxycycline  100 mg Oral Q12H   insulin aspart  0-15 Units Subcutaneous TID AC & HS   ivabradine  7.5 mg Oral BID WC   multivitamin with minerals  1 tablet Oral Daily   potassium chloride SA  20 mEq Oral Daily   predniSONE  40 mg Oral Q  breakfast   sodium chloride flush  10-40 mL Intracatheter Q12H   spironolactone  25 mg Oral Daily   torsemide  60 mg Oral Daily    Infusions:    PRN Medications: acetaminophen, albuterol, ALPRAZolam, guaiFENesin-dextromethorphan, ipratropium-albuterol, naphazoline-pheniramine, nitroGLYCERIN, ondansetron (ZOFRAN) IV, sodium chloride flush, zolpidem    Patient Profile   Brittany [name] is a 61 y.o. 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   Assessment/Plan   1. Acute/Chronic Systolic Heart Failure Due to ICM  - Echo 09/04/18 ([facility]) LVEF 20%, Moderate AI, Mild MR, Mild TR, Severe LAE, RV mildly decreased.  - Echo 02/12/19: EF 25-30%, RV mildly reduced - Echo 11/21 EF 20-25% RV mild HK. - Echo 2022 EF < 20% RV normal - Echo 1/23 EF 20-25% RV ok  - Has [employer] single chamber ICD  - Presented with NYHA class IV symptoms. BNP 2223, HsTrop 37>>70, lactic acid 4.8. Started on empiric milrinone and diuresed w/ IV Lasix  - Echo 07/10/22: EF 20-25%, global hypokinesis, GIIDD, RV systolic function, mildly reduced. RV size normal. Mild MR, trivial TR, aortic valve w/ mild-mod regurg.  - Volume improved w/ diuresis, CVP 4 today. Off Milrinone w/ stable co-ox 73%   - Continue torsemide 60mg  daily (higher dose).  - Continue Digoxin .125mg  daily - Continue Farxiga 10 mg daily.  - Continue 6mEq potassium chloride daily - Continue spiro 25 mg daily. - Continue Corlanor 7.5 mg bid - Failed Entresto due to hyperkalemia.   - No b-blocker due to intolerance with low output and bad COPD with frequent flares - Not a candidate for advanced therapies with her degree of lung disease.   - Now with Barostim in place.    2. Acute / Chronic Hypoxic Respiratory  Failure  - Multifactorial, former smoker, h/o small cell lung cancer treated with chemo, chest XRT, COPD and HF - PCXR: Cardiac enlargement. Emphysematous changes in the lungs. No evidence of active pulmonary disease. - Lactic acid 3.6>>4.8>>1.8>>1.1 - Procal .10 - Resp panel (-), WBC slightly elevated 13.1>>8.3 today, blood cultures NGTD.  - much improved w/ diuresis and treatment of COPDE    3. CAD  - Hx of NSTEMI/STEMI s/p Emergent CABG 02/03/18: Cath with severe 2v CAD as above with severe LV dysfunction/ICM. s/p Emergent CABG 02/03/18.   - Had NSTEMI and now s/p LHC 02/12/19 with DES to the ostial ramus extending into the distal left main.  - No longer on brillinta or ASA due to bleeding and need for [facility] -Continue hi-intensity statin.   4. COPD w/ exacerbation - much improved. No  supp O2 requirements. No wheezing on exam   - Continue nebs - Continue Prednisone taper, starting w/ 40 mg daily.  - Continue doxycycline 100 mg bid x 7 days.    5. Recurrent pleural effusions s/p pleurodesis - Resolved. Recent CXR ok   6. PAF - CHA2DS2/VASc is at least 4. (CHF, Vasc disease, HTN, Female).   - She has history of Afib RVR in the past in the setting of her Lung CA.  - Previously on Xarelto but stopped due to hemoptysis.  - Started on Eliquis in 12/22 due to renal infarct. - Continue Eliquis 2.5 mg bid. No bleeding issues.   7. 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 - Back on Eliquis  - Baseline creatinine 1.2-1.4. Today 1.35    8. H/o SCLC:  - s/p treatment 2015. Lost to f/u since 04/2015.  - Chest CT 02/19/18 with mass-like consolidation in R hilum concerning for recurrent tumor.  - Repeat CT 03/27/2018 -No definite findings of locally recurrent tumor in the right lung. 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 Continued chest CT surveillance is advised in 3-6 months. - S/p thoracentesis 04/2018.  Cytology negative for malignancy.  - CTA 11/19 stable scarring in the right hilum consistent with emphysema. Bronchoscopy on 11/15, no obvious source of bleeding   9. S/p Barostim Implant - Site healed   10. Lactic acidosis - resolved. 3.6>>4.8>>1.8>>1.1 - Procal .10  - Resp panel (-), WBC slightly elevated 13.1>>8.3, blood cultures NGTD.   I think pt can go home today. MD to see. Will arrange close [facility] f/u   Length of Stay: [address] Ladoris Gene  07/12/2022, 8:50 AM  Advanced Heart Failure Team Pager [phone] (M-F; 7a - 5p)  Please contact [facility] for night-coverage after hours (5p -7a ) and weekends on [email or web address]  Patient seen with PA, agree with the above note.   Feeling much better today.  Off oxygen.  Co-ox 73% off milrinone.  CVP 4.   General: NAD Neck: No JVD, no thyromegaly or thyroid nodule.  Lungs: Distant BS, no wheezing.  CV: Nondisplaced PMI.  Heart regular S1/S2, no S3/S4, no murmur.  No peripheral edema.  No carotid bruit.  Normal pedal pulses.  Abdomen: Soft, nontender, no hepatosplenomegaly, no distention.  Skin: Intact without lesions or rashes.  Neurologic: Alert and oriented x 3.  Psych: Normal affect. Extremities: No clubbing or cyanosis.  HEENT: Normal.   I think she can go home today.  Will send on higher dose of torsemide for home (60 mg daily). Creatinine stable 1.35.  She will need to complete treatment for COPD exacerbation, will give her a 7 day prednisone taper (30 mg daily x 2 days, 20 mg daily x 2 days, 10 mg daily x 3 days then stop) and she will complete 7 days of doxycycline.  I think she needs close followup with pulmonary as an outpatient.   Loralie Champagne. 07/12/2022

## 2022-07-12 NOTE — Discharge Summary (Signed)
Advanced Heart Failure Team  Discharge Summary   Patient ID: Brittany [name] MRN: [id number], DOB/AGE: 05/01/1961 61 y.o. 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 Underwent emergent cath 02/03/18 showed LAD 100% stenosed, LCx 95% stenosed. Taken for emergent CABG 02/03/18. Required impella post op. Hospital course complicated by cardiogenic shock, HCAP, A fib, respiratory failure, and swallowing issues. She was discharged to SNF. Discharge weight 103 pounds.    In 2019 had multiple hospitalizations for HF and pleural effusion. Underwent pleurodesis at [facility].   Admitted 3/20 with NSTEMI and HF. Received DES to ostial ramus into distal left main based on cath below and underwent diuresis. Meds adjusted as tolerated. Echo with EF 25-30%.    Admitted 11/21 with COPD flare with mild HF and hemoptysis. CTA showed stable scarring in the right hilum consistent with emphysema.. Underwent bronchoscopy on 11/15, no obvious source of bleeding found on examination.    Echo 11/21 EF 20-25%. RV mildly HK.    Admitted 3/22 with chest pain and shortness of breath. HS-Trop with no trend. Echo with EF < 20%. Treated for AECOPD exacerbation. Breathing improved. Discharged on torsemide 60 mg daily.   Admitted 4/22 with ADHF. Diuresed with IV lasix and transitioned back to torsemide 80 mg daily x 4 days then down to torsemide 60 mg daily.     Admitted 12/22 with R renal infarct felt to be cardio-embolic (no AF found). Started on Eliquis   Was seen in ER 4/23 for worsening SOB. CXR ok. Given 1 dose of IV lasix and nebs.    Follow up 03/24/22, breathing improved, fluid OK on OptiVol and exam, weight 101 lbs.   Admitted 5/23 with a/c CHF and COPD. Diuresed with IV lasix. Treated with doxy, pred and nebs for COPD exacerbation. Underwent Barostim 04/14/22. Discharged home, weight 100 lbs.   She was last seen in clinic 6/23 and was feeling much better on weekly metolazone and post Barostim.     Hospital Course  She presented to the ED on 07/09/22 with 3 days of worsening SOB and DOE. CXR with emphysema.  Lactate initially elevated to 4.7.  She was started empirically on milrinone 0.25 by cardiology (she did not get a co-ox before it was started).  Initial co-ox while on milrinone was 82%.  Lactate normalized. PCT was normal 0.10. Echo showed EF 20-25% (stable), global hypokinesis, GIIDD, RV systolic function, mildly reduced. RV size normal. Mild MR, trivial TR, aortic valve w/ mild-mod regurg.   She was diuresed w/ IV Lasix w/ improvement in volume status but was also felt to have ACOPDE and was treated w/ nebs, steroids and doxycyline with significant improvement in symptoms.   After diuresis w/ IV Lasix was transitioned back to oral torsemide, 60 mg daily (increased dose). GDMT continued. She was weaned off Milrinone and digoxin added to HF regimen. Co-ox remained stable off milrinone.   On 8/2, she was  last seen and examined by Dr. Aundra Dubin and felt stable for d/c home. She will continue prednisone taper x 1 week and tx w/ doxycyline for 7 day course. Prednisone taper (30 mg daily x 2 days, 20 mg daily x 2 days, 10 mg daily x 3 days then stop).   F/u arranged in the [facility].    Discharge Weight Range: 99 lb Discharge Vitals: Blood pressure (!) 103/43, pulse 72, temperature (!) 97.5 F (36.4 C), temperature source Oral, resp. rate 16, height  4' 11"  (1.499 m), weight 45.2 kg, SpO2 99 %.  Labs: Lab Results  Component Value Date   WBC 9.2 07/12/2022   HGB 13.6 07/12/2022   HCT 41.8 07/12/2022   MCV 91.9 07/12/2022   PLT 159 07/12/2022    Recent Labs  Lab 07/09/22 0800 07/09/22 0828 07/12/22 0630  NA 138   < > 136  K 4.6   < > 3.5  CL 111   < > 101  CO2 19*   < > 28  BUN 10   < > 37*  CREATININE 1.26*   < > 1.35*  CALCIUM 9.2   < > 9.5  PROT 7.3  --   --   BILITOT 1.0  --   --   ALKPHOS 75  --   --   ALT 14  --   --   AST 27  --   --   GLUCOSE 271*   < > 89   < > = values in this interval not displayed.   Lab Results  Component Value Date   CHOL 264 (H) 05/20/2020   HDL 54 05/20/2020   LDLCALC 189 (H) 05/20/2020   TRIG 103 05/20/2020   BNP (last 3 results) Recent Labs    04/25/22 1244 06/17/22 1922 07/09/22 0800  BNP 1,232.9* 862.1* 2,223.4*    ProBNP (last 3 results) Recent Labs    07/19/21 0945  PROBNP 3,162*     Diagnostic Studies/Procedures   2D Echo 07/10/22 Left ventricular ejection fraction, by estimation, is 20 to 25%. The left ventricle has severely decreased function. There is global hypokinesis with akinesis of the mid-toapical septal, mid-to-apical inferior, all apical segments and apex. The left ventricular internal cavity size was moderately dilated. Left ventricular diastolic parameters are consistent with Grade II diastolic dysfunction (pseudonormalization). Elevated left atrial pressure. 1. Right ventricular systolic function is mildly reduced. The right ventricular size is normal. Tricuspid regurgitation signal is inadequate for assessing PA pressure. 2. 3. Left atrial size was severely dilated. 4. The mitral valve is abnormal. Mild mitral valve regurgitation. The aortic valve is tricuspid. There is mild calcification of the aortic valve. There is mild thickening of the aortic valve. Aortic valve regurgitation is mild to moderate. 5. The inferior vena cava is normal in  size with greater than 50% respiratory variability, suggesting right atrial pressure of 3 mmHg.  Discharge Medications   Allergies as of 07/12/2022       Reactions   Codeine Nausea And Vomiting        Medication List     TAKE these medications    albuterol 108 (90 Base) MCG/ACT inhaler Commonly known as: VENTOLIN HFA Inhale 2 puffs into the lungs every 6 (six) hours as needed for wheezing or shortness of breath.   atorvastatin 80 MG tablet Commonly known as: LIPITOR Take 1 tablet (80 mg total) by mouth daily at 6 PM.   blood glucose meter kit and supplies Kit Dispense based on patient  and insurance preference. Use up to four times daily as directed.   digoxin 0.125 MG tablet Commonly known as: LANOXIN Take 1 tablet (0.125 mg total) by mouth daily. Start taking on: July 13, 2022   doxycycline 100 MG tablet Commonly known as: VIBRA-TABS Take 1 tablet (100 mg total) by mouth every 12 (twelve) hours.   Eliquis 2.5 MG Tabs tablet Generic drug: apixaban Take 1 tablet (2.5 mg total) by mouth 2 (two) times daily.   Farxiga 10 MG Tabs tablet Generic drug: dapagliflozin propanediol TAKE 1 TABLET (10 MG TOTAL) BY MOUTH DAILY BEFORE BREAKFAST. What changed:  how much to take how to take this when to take this   Glucerna Liqd Take 237 mLs by mouth See admin instructions. Takes 1 bottle twice a week   ipratropium-albuterol 0.5-2.5 (3) MG/3ML Soln Commonly known as: DUONEB Take 3 mLs by nebulization every 4 (four) hours as needed (for shortness of breath).   ivabradine 7.5 MG Tabs tablet Commonly known as: Corlanor TAKE 1 TABLET (7.5 MG TOTAL) BY MOUTH 2 (TWO) TIMES DAILY WITH A MEAL. What changed:  how much to take how to take this when to take this   metolazone 2.5 MG tablet Commonly known as: ZAROXOLYN Take 2.5 mg by mouth daily as needed (edema).   multivitamin with minerals Tabs tablet Take 1 tablet by mouth daily.   nitroGLYCERIN 0.4 MG SL  tablet Commonly known as: NITROSTAT Place 1 tablet (0.4 mg total) under the tongue every 5 (five) minutes as needed for chest pain.   potassium chloride SA 20 MEQ tablet Commonly known as: KLOR-CON M Take 1 tablet (20 mEq total) by mouth daily. TAke extra 12mq when you take higher dose of torsemide or metolazone   predniSONE 10 MG tablet Commonly known as: DELTASONE Take 3 tablets (30 mg total) by mouth daily with breakfast for 2 days, THEN 2 tablets (20 mg total) daily with breakfast for 2 days, THEN 1 tablet (10 mg total) daily with breakfast for 3 days. Start taking on: July 13, 2022 What changed:  medication strength See the new instructions.   spironolactone 25 MG tablet Commonly known as: ALDACTONE Take 1 tablet (25 mg total) by mouth daily.   torsemide 20 MG tablet Commonly known as: DEMADEX Take 3 tablets (60 mg total) by mouth daily. Start taking on: July 13, 2022 What changed: how much to take   Visine 0.025-0.3 % ophthalmic solution Generic drug: naphazoline-pheniramine Place 1 drop into both eyes 4 (four) times daily as needed for eye irritation.        Disposition   The patient will be discharged in stable condition to home.   Follow-up Information     Bensimhon, DShaune Pascal MD Follow up.   Specialty: Cardiology Why: Hospital Follow-up   [facility]14 [facility] 1:40 [facility] [id number]                   Duration of Discharge Encounter: Greater than 35 minutes   Signed, BLyda Jester PA-C   07/12/2022, 11:48 AM

## 2022-07-12 NOTE — Progress Notes (Signed)
   07/12/22 1100  Mobility  Activity Refused mobility    Pt stating she already ambulated today and is being discharged today. Will follow up as time allows.

## 2022-07-12 NOTE — TOC Transition Note (Signed)
Transition of Care [facility]) - CM/SW Discharge Note   Patient Details  Name: Brittany [name] MRN: [id number] Date of Birth: Sep 23, 1961  [facility] CM/SW Contact:  Zenon Mayo, RN Phone Number: 07/12/2022, 2:22 PM   Clinical Narrative:    Patient is for dc today, patient has been set up by previous NCM with [employer].  [employer] rep will be here between 1 and 3 pm to go over NIV with patient.      Barriers to Discharge: Continued Medical Work up   Patient Goals and CMS Choice Patient states their goals for this hospitalization and ongoing recovery are:: patient states she wants to be able to maintain her independence CMS Medicare.gov Compare Post Acute Care list provided to:: Patient    Discharge Placement                       Discharge Plan and Services   Discharge Planning Services: CM Consult                                 Social Determinants of Health (SDOH) Interventions     Readmission Risk Interventions    06/20/2022   10:00 AM 04/11/2021   10:16 AM  Readmission Risk Prevention Plan  Transportation Screening Complete Complete  PCP or Specialist Appt within 3-5 Days Complete Complete  HRI or Home Care Consult Complete Complete  Social Work Consult for [place] Planning/Counseling Complete Complete  Palliative Care Screening Not Applicable Not Applicable  Medication Review [employer]) Complete Complete

## 2022-07-12 NOTE — Plan of Care (Signed)
Discharge medications, instructions and follow up appointments discussed with patient. AVS given. No questions or concerns with d/c.

## 2022-07-14 ENCOUNTER — Telehealth: Payer: Self-pay | Admitting: *Deleted

## 2022-07-14 LAB — CULTURE, BLOOD (ROUTINE X 2)
Culture: NO GROWTH
Culture: NO GROWTH
Special Requests: ADEQUATE

## 2022-07-24 ENCOUNTER — Encounter (HOSPITAL_COMMUNITY): Payer: BC Managed Care – PPO | Admitting: Internal Medicine

## 2022-07-25 ENCOUNTER — Telehealth (HOSPITAL_COMMUNITY): Payer: Self-pay

## 2022-07-25 ENCOUNTER — Other Ambulatory Visit (HOSPITAL_COMMUNITY): Payer: Self-pay

## 2022-07-25 NOTE — Telephone Encounter (Signed)
Transitions of Care Pharmacy  ° °Call attempted for a pharmacy transitions of care follow-up. HIPAA appropriate voicemail was left with call back information provided.  ° °Call attempt #1. Will follow-up in 2-3 days.  °  °

## 2022-07-26 ENCOUNTER — Telehealth: Payer: Self-pay

## 2022-07-26 ENCOUNTER — Ambulatory Visit (INDEPENDENT_AMBULATORY_CARE_PROVIDER_SITE_OTHER): Payer: BC Managed Care – PPO

## 2022-07-26 DIAGNOSIS — Z95810 Presence of automatic (implantable) cardiac defibrillator: Secondary | ICD-10-CM

## 2022-07-26 DIAGNOSIS — I5022 Chronic systolic (congestive) heart failure: Secondary | ICD-10-CM

## 2022-07-26 MED ORDER — POTASSIUM CHLORIDE CRYS ER 20 MEQ PO TBCR: 20.0000 meq | EXTENDED_RELEASE_TABLET | Freq: Every day | ORAL | 6 refills | Status: DC

## 2022-07-26 NOTE — Telephone Encounter (Signed)
Refill sent in

## 2022-07-26 NOTE — Addendum Note (Signed)
Addended by: Scarlette Calico on: 07/26/2022 05:14 PM   Modules accepted: Orders

## 2022-07-26 NOTE — Telephone Encounter (Signed)
ICM Call to patient.  She reports she needs a Potassium refill.  Last script was filled at time of July discharge.  Routed to HF clinic for review and refill.

## 2022-07-26 NOTE — Telephone Encounter (Signed)
Received call from patient stating she sent remote transmission in for fluid level review.  See ICM note.

## 2022-07-26 NOTE — Progress Notes (Signed)
Spoke with patient and advised Allena Katz, NP agreed she can take Metolazone 2.5 mg with 40 KCL for fluid.  BMET needed in a week and scheduled for 8/23. Pt requested potassium refill and request sent to Advanced HF clinic refill.

## 2022-07-26 NOTE — Progress Notes (Signed)
EPIC Encounter for ICM Monitoring  Patient Name: Brittany [name] is a 61 y.o. female Date: 07/26/2022 Primary Care Physican: Traci Sermon, PA-C Primary Cardiologist: [place] Electrophysiologist: [place] 03/24/2022 Weight: 101 lbs 04/19/2022 Weight: 101 lbs 05/16/2022 Weight: 101 lbs 05/24/2022 Weight: 101 lbs    Received call from patient.  She reports feeling congested in throat and chest.  She sent remote transmission for review.  She took 1 extra Torsemide this morning at 9 AM but still having congestion.  Hospitalized 7/30-8/2.    Optivol Thoracic impedance suggesting possible fluid accumulation starting 8/5 (after 8/2 hospital discharge).   Barostim implant.   Prescribed: Torsemide 20 mg take 3 tablets (60 mg total) by mouth once a day. Potassium 20 mEq Take 1 tablet (20 mEq total) by mouth daily. TAke extra 12meq when you take higher dose of torsemide or metolazone Metolazone 2.5 mg take by mouth as needed for edema  Spironolactone 25 mg take 1 tablet (25 mg total) by mouth daily Farxiga 10 mg take 1 tablet daily   Labs: 07/12/2022 Creatinine 1.35, BUN 37, Potassium 3.5, Sodium 136, GFR 45 07/11/2022 Creatinine 1.29, BUN 33, Potassium 4.1, Sodium 136, GFR 47  07/10/2022 Creatinine 1.52, BUN 32, Potassium 4.4, Sodium 135, GFR 39  07/09/2022 Creatinine 1.10, BUN 15, Potassium 4.7, Sodium 140, GFR 49  06/20/2022 Creatinine 1.56, BUN 56, Potassium 4.1, Sodium 136, GFR 38 06/19/2022 Creatinine 1.55, BUN 46, Potassium 4.3, Sodium 134, GFR 38  06/18/2022 Creatinine 1.62, BUN 33, Potassium 3.4, Sodium 135, GFR 36  06/17/2022 Creatinine 1.55, BUN 26, Potassium 3.6, Sodium 138, GFR 38  05/24/2022 Creatinine 1.15, BUN 18, Potassium 4.3, Sodium 138, GFR 54 05/17/2022 Creatinine 1.46, BUN 33, Potassium 2.9, Sodium 137, GFR 41 A complete set of results can be found in Results Review.   Recommendations:   Patient plans on taking Metolazone this afternoon for fluid accumulation  with 1 extra Potassium along with 1 prescribed tablet for total of 2 today.      Pt needs Potassium refill (last prescription was given at hospital discharge 7/11).   Follow-up plan: ICM clinic phone appointment on 07/31/2022.   91 day device clinic remote transmission 09/20/2022.     EP/Cardiology Office Visits:    08/09/2022 with HF clinic.   Copy of ICM check sent to Dr. Rayann Heman.  Copy sent to Allena Katz, NP for review and further recommendations if needed.    3 month ICM trend: 07/26/2022.    12-14 Month ICM trend:     [name], RN 07/26/2022 1:47 PM

## 2022-07-26 NOTE — Progress Notes (Signed)
Received: Today [name], Maricela Bo, FNP  Bev Drennen Panda, RN She can take metolazone 2.5 mg + extra 40 KCL today. She will need BMET next week please

## 2022-07-27 ENCOUNTER — Telehealth (HOSPITAL_COMMUNITY): Payer: Self-pay

## 2022-07-27 NOTE — Progress Notes (Signed)
Spoke with patient .  She took Metolazone yesterday and extra Torsemide today.  Updated remote transmission sent and Optivol showing slight improvement.  Reminded to take extra Potassium when taking metolazone or extra Torsemide.  Her son is in town for a week and she may be eating at restaurants more during his visit.  Advised to limit salt though out the day if she is going to dinner because restaurant foods typically are high in salt.  Advised to call HF clinic if needed on 8/18 as I will not be in the office.  Will recheck fluid levels on 8/21.

## 2022-07-27 NOTE — Telephone Encounter (Signed)
Transitions of Care Pharmacy   Call attempted for a pharmacy transitions of care follow-up. HIPAA appropriate voicemail was not available for this patient.   Call attempt #2. Will follow-up in 2-3 days.

## 2022-07-31 ENCOUNTER — Ambulatory Visit (INDEPENDENT_AMBULATORY_CARE_PROVIDER_SITE_OTHER): Payer: BC Managed Care – PPO

## 2022-07-31 ENCOUNTER — Telehealth: Payer: Self-pay

## 2022-07-31 DIAGNOSIS — I5022 Chronic systolic (congestive) heart failure: Secondary | ICD-10-CM | POA: Diagnosis not present

## 2022-07-31 DIAGNOSIS — Z95810 Presence of automatic (implantable) cardiac defibrillator: Secondary | ICD-10-CM

## 2022-07-31 NOTE — Progress Notes (Signed)
EPIC Encounter for ICM Monitoring  Patient Name: Brittany [name] is a 61 y.o. female Date: 07/31/2022 Primary Care Physican: Traci Sermon, PA-C Primary Cardiologist: [place] Electrophysiologist: Allred 03/24/2022 Weight: 101 lbs 04/19/2022 Weight: 101 lbs 05/16/2022 Weight: 101 lbs 05/24/2022 Weight: 101 lbs    Received call from patient.  She reports feeling much better and did well over the weekend.  She limited salt intake as recommended.    Optivol Thoracic impedance suggesting fluid levels returned to normal after taking Metolazone and extra Potassium.   Barostim implant.   Prescribed: Torsemide 20 mg take 3 tablets (60 mg total) by mouth once a day. Potassium 20 mEq Take 1 tablet (20 mEq total) by mouth daily. TAke extra 35meq when you take higher dose of torsemide or metolazone Metolazone 2.5 mg take by mouth as needed for edema  Spironolactone 25 mg take 1 tablet (25 mg total) by mouth daily Farxiga 10 mg take 1 tablet daily   Labs: 08/02/2022 BMET scheduled 07/12/2022 Creatinine 1.35, BUN 37, Potassium 3.5, Sodium 136, GFR 45 07/11/2022 Creatinine 1.29, BUN 33, Potassium 4.1, Sodium 136, GFR 47  07/10/2022 Creatinine 1.52, BUN 32, Potassium 4.4, Sodium 135, GFR 39  07/09/2022 Creatinine 1.10, BUN 15, Potassium 4.7, Sodium 140, GFR 49  06/20/2022 Creatinine 1.56, BUN 56, Potassium 4.1, Sodium 136, GFR 38 06/19/2022 Creatinine 1.55, BUN 46, Potassium 4.3, Sodium 134, GFR 38  06/18/2022 Creatinine 1.62, BUN 33, Potassium 3.4, Sodium 135, GFR 36  06/17/2022 Creatinine 1.55, BUN 26, Potassium 3.6, Sodium 138, GFR 38  05/24/2022 Creatinine 1.15, BUN 18, Potassium 4.3, Sodium 138, GFR 54 05/17/2022 Creatinine 1.46, BUN 33, Potassium 2.9, Sodium 137, GFR 41 A complete set of results can be found in Results Review.   Recommendations:  No changes and encouraged to call if experiencing any fluid symptoms.   Follow-up plan: ICM clinic phone appointment on 08/07/2022 to  check fluid levels stability.   91 day device clinic remote transmission 09/20/2022.     EP/Cardiology Office Visits:   08/09/2022 with HF clinic.   Copy of ICM check sent to Dr. Rayann Heman.   3 month ICM trend: 07/31/2022.    12-14 Month ICM trend:     [name], RN 07/31/2022 5:02 PM

## 2022-07-31 NOTE — Telephone Encounter (Signed)
Spoke with patient and advised automatic remote ICM transmission was not received today.  She checked the monitor and was accidentally unplugged.  She will attempt to send manual transmission and advised will call her back when transmission is received and reviewed.

## 2022-08-02 ENCOUNTER — Ambulatory Visit (HOSPITAL_COMMUNITY)
Admission: RE | Admit: 2022-08-02 | Discharge: 2022-08-02 | Disposition: A | Discharge status: Home / Self Care | Payer: BC Managed Care – PPO | Source: Ambulatory Visit | Attending: Internal Medicine | Admitting: Internal Medicine

## 2022-08-02 DIAGNOSIS — I5022 Chronic systolic (congestive) heart failure: Secondary | ICD-10-CM | POA: Insufficient documentation

## 2022-08-02 LAB — BASIC METABOLIC PANEL
Anion gap: 9 (ref 5–15)
BUN: 29 mg/dL — ABNORMAL HIGH (ref 8–23)
CO2: 30 mmol/L (ref 22–32)
Calcium: 9.9 mg/dL (ref 8.9–10.3)
Chloride: 101 mmol/L (ref 98–111)
Creatinine, Ser: 1.46 mg/dL — ABNORMAL HIGH (ref 0.44–1.00)
GFR, Estimated: 41 mL/min — ABNORMAL LOW (ref >60)
Glucose, Bld: 106 mg/dL — ABNORMAL HIGH (ref 70–99)
Potassium: 3 mmol/L — ABNORMAL LOW (ref 3.5–5.1)
Sodium: 140 mmol/L (ref 135–145)

## 2022-08-07 ENCOUNTER — Ambulatory Visit (INDEPENDENT_AMBULATORY_CARE_PROVIDER_SITE_OTHER): Payer: BC Managed Care – PPO

## 2022-08-07 DIAGNOSIS — Z95810 Presence of automatic (implantable) cardiac defibrillator: Secondary | ICD-10-CM

## 2022-08-07 DIAGNOSIS — I5022 Chronic systolic (congestive) heart failure: Secondary | ICD-10-CM

## 2022-08-07 NOTE — Progress Notes (Signed)
EPIC Encounter for ICM Monitoring  Patient Name: Brittany [name] is a 61 y.o. female Date: 08/07/2022 Primary Care Physican: Traci Sermon, PA-C Primary Cardiologist: [place] Electrophysiologist: Quentin Ore 05/24/2022 Weight: 101 lbs 08/07/2022 Weight: 101 lbs    Received call from patient.  She reports she is feeling well and not experiencing any fluid symptoms.    Optivol Thoracic impedance suggesting fluid accumulation returned on 8/22.   Barostim implant.   Prescribed: Torsemide 20 mg take 3 tablets (60 mg total) by mouth once a day. Potassium 20 mEq Take 1 tablet (20 mEq total) by mouth daily. Take extra 68meq when you take higher dose of torsemide or metolazone Metolazone 2.5 mg take by mouth as needed for edema  Spironolactone 25 mg take 1 tablet (25 mg total) by mouth daily Farxiga 10 mg take 1 tablet daily   Labs: 08/02/2022 Creatinine 1.46, BUN 29, Potassium 3.0, Sodium 140, GFR 41 07/12/2022 Creatinine 1.35, BUN 37, Potassium 3.5, Sodium 136, GFR 45 07/11/2022 Creatinine 1.29, BUN 33, Potassium 4.1, Sodium 136, GFR 47  07/10/2022 Creatinine 1.52, BUN 32, Potassium 4.4, Sodium 135, GFR 39  07/09/2022 Creatinine 1.10, BUN 15, Potassium 4.7, Sodium 140, GFR 49  06/20/2022 Creatinine 1.56, BUN 56, Potassium 4.1, Sodium 136, GFR 38 06/19/2022 Creatinine 1.55, BUN 46, Potassium 4.3, Sodium 134, GFR 38  06/18/2022 Creatinine 1.62, BUN 33, Potassium 3.4, Sodium 135, GFR 36  06/17/2022 Creatinine 1.55, BUN 26, Potassium 3.6, Sodium 138, GFR 38  05/24/2022 Creatinine 1.15, BUN 18, Potassium 4.3, Sodium 138, GFR 54 05/17/2022 Creatinine 1.46, BUN 33, Potassium 2.9, Sodium 137, GFR 41 A complete set of results can be found in Results Review.   Recommendations:  She will take extra 1 Torsemide tomorrow morning with extra 1 Potassium.  She will receive recommendations at 8/30 HF clinic OV.    Follow-up plan: ICM clinic phone appointment on 08/21/2022 to recheck fluid  levels.   91 day device clinic remote transmission 09/20/2022.     EP/Cardiology Office Visits:   08/09/2022 with HF clinic.     Copy of ICM check sent to Dr. Quentin Ore (replacing Dr Rayann Heman).   3 month ICM trend: 08/07/2022.    12-14 Month ICM trend:     [name], RN 08/07/2022 3:32 PM

## 2022-08-08 ENCOUNTER — Other Ambulatory Visit (HOSPITAL_COMMUNITY): Payer: Self-pay

## 2022-08-08 NOTE — Progress Notes (Signed)
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 Underwent emergent cath 02/03/18 showed LAD 100% stenosed, LCx 95% stenosed. Taken for emergent CABG 02/03/18. Required impella post op. Hospital course complicated by cardiogenic shock, HCAP, A fib, respiratory failure, and swallowing issues. She was discharged to SNF. Discharge weight 103 pounds.    In 2019 had multiple hospitalizations for HF and pleural effusion. Underwent pleurodesis at [facility].   Admitted 3/20 with NSTEMI and HF. Received DES to ostial ramus into distal left main based on cath below and underwent diuresis. Meds adjusted as tolerated. Echo with EF 25-30%.   Admitted 11/21 with COPD flare with mild HF and hemoptysis. CTA showed stable scarring in the right hilum consistent with emphysema.. Underwent bronchoscopy on 11/15, no obvious source of bleeding found on examination.   Echo 11/21 EF 20-25%. RV mildly HK.   Admitted 3/22 with chest pain and shortness of breath. HS-Trop with no trend. Echo with EF < 20%. Treated for AECOPD exacerbation. Breathing improved. Discharged on torsemide 60 mg daily.  Admitted 4/22 with ADHF. Diuresed with IV lasix and transitioned back to torsemide 80 mg daily x 4 days then down to torsemide 60 mg daily.   Admitted 12/22 with R renal infarct felt to be cardio-embolic (no AF found). Started on Eliquis  Was seen in ER 4/23 for worsening SOB. CXR ok. Given 1 dose of IV lasix and nebs.   Admitted 5/23 with a/c CHF and COPD. Diuresed with IV lasix. Treated with doxy, pred and  nebs for COPD exacerbation. Underwent Barostim 04/14/22. Discharged home, weight 100 lbs.  Admitted 7/23 with a/c COPD exacerbation. HF well-compensated.   Admitted 8/23 w/ a/c respiratory failure and CHF. Concern for low output vs PNA vs COPD exacerbation. PICC placed and milrinone started. Diuresed w/ IV Lasix. Given prednisone taper and doxy for COPD exacerbation. Echo showed EF 20-25%, global hypokinesis, GIIDD, RV systolic function, mildly reduced. RV size normal. Mild MR, trivial TR, aortic valve w/ mild-mod regurg. Drips weaned, GDMT adjusted, discharged home on higher torsemide dose of 60 mg daily, weight 99 lbs.  Today she returns for post hospital HF follow up. Overall feeling better since Barostim, able to make her king-sized bed without dyspnea now. Walking in the evenings without SOB. Denies palpitations, CP, dizziness, edema, or PND/Orthopnea. Appetite ok. No fever or chills. Weight at home 102 pounds. She has been taking torsemide 40 mg daily. Has not had metolazone in several weeks. Wearing BiApap when she feels she needs. Has home oxygen for PRN use.   Cardidac Studies: - Echo (7/23): EF 20-25%, global hypokinesis, GIIDD, RV systolic function, mildly reduced. RV size normal. Mild MR, trivial TR, aortic valve w/ mild-mod regurg  - Echo 1/23: EF 20-25%, severely reduced LV with global HK, grade II DD, normal RV  - Echo 02/10/21: EF <20%, G2DD  - Echo 2022: EF < 20% RV normal   - Echo 2021: EF 20-25% RV mildly reduced.   - Echo 02/12/19: EF 25-30%, RV mildly reduced  - 05/18/2021 PFT's: moderately severe  airway obstruction and a diffusion defect suggesting emphysema.   - LHC 02/12/19: Ost LAD to Prox LAD lesion is 100% stenosed. Ost Cx to Prox Cx lesion is 100% stenosed. Ost Ramus lesion is 99% stenosed. Post intervention, there is a 0% residual stenosis. A drug-eluting stent was successfully placed using a Fowler [id number]. SVG and is normal in caliber. The graft  exhibits no disease. Ost 1st Diag lesion is 70% stenosed. SVG and is normal in caliber. Prox Graft lesion is 100% stenosed. 1.  Significant underlying two-vessel coronary artery disease with patent SVG to OM and SVG to diagonal which supplies the LAD territory.  Atretic LIMA to LAD given competitive flow from SVG to diagonal.  Severe ostial ramus artery stenosis not supplied by bypass with his area suggestive of plaque rupture. 2.  Severely elevated left ventricular end-diastolic pressure 34 mmHg.  Left ventricular angiography was not performed. 3.  Successful angioplasty and drug-eluting stent placement to the ostial ramus extending into the distal left main.  [place] 2019  RA = 8 RV = 49/9 PA = 47/20 (32) PCW = 19 Fick cardiac output/index = 4.3/3.1 PVR = 3.0 wu AO sat = 93% PA sat = 62%, 64%   1. Minimally elevated left heart pressures 2. Mild PAH 3. Normal cardiac output  Past Medical History:  Diagnosis Date   Acute respiratory failure ([place]) 50/93/2671   Acute systolic congestive heart failure ([place]) 02/03/2018   AICD (automatic cardioverter/defibrillator) present    Allergy    Anxiety    Asthma    DM2 (diabetes mellitus, type 2) ([place]) 10/20/2020   Hypertension    PAF (paroxysmal atrial fibrillation) ([place])    Presence of permanent cardiac pacemaker    Prophylactic measure 08/03/14-08/19/14   Prophyl. 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   LIMA to LAD, SVG to D1, SVG to OM1, EVH via right thigh with implantation of Impella LD LVAD via direct aortic approach   Small cell lung cancer ([place]) 03/16/2014   Past Surgical History:  Procedure Laterality Date   BRONCHIAL BRUSHINGS  10/25/2020   Procedure: BRONCHIAL BRUSHINGS;  Surgeon: Collene Gobble, MD;  Location: [facility];  Service: Pulmonary;;   BRONCHIAL NEEDLE ASPIRATION BIOPSY  10/25/2020   Procedure: BRONCHIAL NEEDLE ASPIRATION BIOPSIES;  Surgeon: Collene Gobble, MD;  Location: [place] [facility];   Service: Pulmonary;;   CARDIAC DEFIBRILLATOR PLACEMENT  08/15/2018   MDT Visia AF MRI VR ICD implanted by Dr Loel Lofty for primary prevention of sudden   CESAREAN SECTION     CORONARY ARTERY BYPASS GRAFT N/A 02/03/2018   Procedure: CORONARY ARTERY BYPASS GRAFTING (CABG);  Surgeon: Rexene Alberts, MD;  Location: [place];  Service: Open Heart Surgery;  Laterality: N/A;  Time 3 using left internal mammary artery and endoscopically harvested right saphenous vein   CORONARY BALLOON ANGIOPLASTY N/A 02/03/2018   Procedure: CORONARY BALLOON ANGIOPLASTY;  Surgeon: Martinique, Peter M, MD;  Location: [place] CV [facility];  Service: Cardiovascular;  Laterality: N/A;   CORONARY STENT INTERVENTION N/A 02/12/2019   Procedure: CORONARY STENT INTERVENTION;  Surgeon: Wellington Hampshire, MD;  Location: [place] [facility];  Service: Cardiovascular;  Laterality: N/A;   CORONARY/GRAFT ACUTE MI REVASCULARIZATION N/A 02/03/2018   Procedure: Coronary/Graft Acute MI Revascularization;  Surgeon: Martinique, Peter M, MD;  Location: [place] [facility];  Service: Cardiovascular;  Laterality: N/A;   ENDOBRONCHIAL ULTRASOUND N/A 10/25/2020   Procedure: ENDOBRONCHIAL ULTRASOUND;  Surgeon: Collene Gobble, MD;  Location: [facility];  Service: Pulmonary;  Laterality: N/A;   FLEXIBLE BRONCHOSCOPY  10/25/2020   Procedure: FLEXIBLE BRONCHOSCOPY;  Surgeon: Collene Gobble, MD;  Location: [facility];  Service: Pulmonary;;   IABP INSERTION N/A 02/03/2018   Procedure: IABP Insertion;  Surgeon: Martinique, Peter M, MD;  Location: [place] [facility];  Service: Cardiovascular;  Laterality: N/A;   INTRAOPERATIVE TRANSESOPHAGEAL ECHOCARDIOGRAM N/A 02/03/2018   Procedure: INTRAOPERATIVE TRANSESOPHAGEAL ECHOCARDIOGRAM;  Surgeon: Rexene Alberts, MD;  Location: [place];  Service: Open Heart Surgery;  Laterality: N/A;   LEFT HEART CATH AND CORONARY ANGIOGRAPHY N/A 02/03/2018   Procedure: LEFT HEART CATH AND CORONARY ANGIOGRAPHY;  Surgeon: Martinique, Peter M, MD;   Location: [place] [facility];  Service: Cardiovascular;  Laterality: N/A;   LEFT HEART CATH AND CORS/GRAFTS ANGIOGRAPHY N/A 02/12/2019   Procedure: LEFT HEART CATH AND CORS/GRAFTS ANGIOGRAPHY;  Surgeon: Wellington Hampshire, MD;  Location: [place] [facility];  Service: Cardiovascular;  Laterality: N/A;   MEDIASTINOSCOPY N/A 03/11/2014   Procedure: MEDIASTINOSCOPY;  Surgeon: Melrose Nakayama, MD;  Location: [place];  Service: Thoracic;  Laterality: N/A;   PLACEMENT OF IMPELLA LEFT VENTRICULAR ASSIST DEVICE  02/03/2018   Procedure: PLACEMENT OF [place] LEFT VENTRICULAR ASSIST DEVICE LD;  Surgeon: Rexene Alberts, MD;  Location: [place];  Service: Open Heart Surgery;;   REMOVAL OF [place] LEFT VENTRICULAR ASSIST DEVICE N/A 02/08/2018   Procedure: REMOVAL OF [place] LEFT VENTRICULAR ASSIST DEVICE;  Surgeon: Rexene Alberts, MD;  Location: [place];  Service: Open Heart Surgery;  Laterality: N/A;   RIGHT HEART CATH N/A 02/03/2018   Procedure: RIGHT HEART CATH;  Surgeon: Martinique, Peter M, MD;  Location: [place] CV [facility];  Service: Cardiovascular;  Laterality: N/A;   RIGHT HEART CATH N/A 05/09/2018   Procedure: RIGHT HEART CATH;  Surgeon: Jolaine Artist, MD;  Location: [place] [facility];  Service: Cardiovascular;  Laterality: N/A;   TEE WITHOUT CARDIOVERSION N/A 02/08/2018   Procedure: TRANSESOPHAGEAL ECHOCARDIOGRAM (TEE);  Surgeon: Rexene Alberts, MD;  Location: [place];  Service: Open Heart Surgery;  Laterality: N/A;   TUBAL LIGATION     VIDEO BRONCHOSCOPY WITH ENDOBRONCHIAL ULTRASOUND N/A 03/11/2014   Procedure: VIDEO BRONCHOSCOPY WITH ENDOBRONCHIAL ULTRASOUND;  Surgeon: Melrose Nakayama, MD;  Location: [facility];  Service: Thoracic;  Laterality: N/A;   Current Outpatient Medications  Medication Sig Dispense Refill   albuterol (VENTOLIN HFA) 108 (90 Base) MCG/ACT inhaler Inhale 2 puffs into the lungs every 6 (six) hours as needed for wheezing or shortness of breath. 8.5 g 1   apixaban (ELIQUIS) 2.5 MG TABS  tablet Take 1 tablet (2.5 mg total) by mouth 2 (two) times daily. 60 tablet 0   atorvastatin (LIPITOR) 80 MG tablet Take 1 tablet (80 mg total) by mouth daily at 6 PM. 30 tablet 1   blood glucose meter kit and supplies KIT Dispense based on patient and insurance preference. Use up to four times daily as directed. 1 each 0   digoxin (LANOXIN) 0.125 MG tablet Take 1 tablet (0.125 mg total) by mouth daily. 30 tablet 5   doxycycline (VIBRA-TABS) 100 MG tablet Take 1 tablet (100 mg total) by mouth every 12 (twelve) hours. 14 tablet 0   FARXIGA 10 MG TABS tablet TAKE 1 TABLET (10 MG TOTAL) BY MOUTH DAILY BEFORE BREAKFAST. (Patient taking differently: 10 mg daily.) 30 tablet 6   Glucerna (GLUCERNA) LIQD Take 237 mLs by mouth See admin instructions. Takes 1 bottle twice  a week     ipratropium-albuterol (DUONEB) 0.5-2.5 (3) MG/3ML SOLN Take 3 mLs by nebulization every 4 (four) hours as needed (for shortness of breath). 90 mL 3   ivabradine (CORLANOR) 7.5 MG TABS tablet TAKE 1 TABLET (7.5 MG TOTAL) BY MOUTH 2 (TWO) TIMES DAILY WITH A MEAL. (Patient taking differently: Take 7.5 mg by mouth 2 (two) times daily with a meal. TAKE 1 TABLET (7.5 MG TOTAL) BY MOUTH 2 (TWO) TIMES DAILY WITH A MEAL.) 180 tablet 0   Multiple Vitamin (MULTIVITAMIN WITH MINERALS) TABS tablet Take 1 tablet by mouth daily.     naphazoline-pheniramine (VISINE) 0.025-0.3 % ophthalmic solution Place 1 drop into both eyes 4 (four) times daily as needed for eye irritation.     potassium chloride SA (KLOR-CON M) 20 MEQ tablet Take 1 tablet (20 mEq total) by mouth daily. TAke extra 42mq when you take higher dose of torsemide or metolazone 45 tablet 6   spironolactone (ALDACTONE) 25 MG tablet Take 1 tablet (25 mg total) by mouth daily. 45 tablet 3   torsemide (DEMADEX) 20 MG tablet Take 3 tablets (60 mg total) by mouth daily. (Patient taking differently: Take 60 mg by mouth daily. Taking 40 mg daily) 90 tablet 5   metolazone (ZAROXOLYN) 2.5 MG  tablet Take 2.5 mg by mouth daily as needed (edema). (Patient not taking: Reported on 08/09/2022)     nitroGLYCERIN (NITROSTAT) 0.4 MG SL tablet Place 1 tablet (0.4 mg total) under the tongue every 5 (five) minutes as needed for chest pain. (Patient not taking: Reported on 08/09/2022) 90 tablet 5   No current facility-administered medications for this encounter.    Allergies:   Codeine   Social History:  The patient  reports that she quit smoking about 8 years ago. Her smoking use included cigarettes. She has a 20.00 pack-year smoking history. She has never used smokeless tobacco. She reports current alcohol use of about 8.0 standard drinks of alcohol per week. She reports current drug use. Drug: Marijuana.   Family History:  The patient's family history includes Cancer in her maternal grandmother; Diabetes in her paternal grandmother; Heart attack in her mother; Heart disease in her mother; Hypertension in her maternal grandmother and mother.   ROS:  Please see the history of present illness.   All other systems are personally reviewed and negative.    Recent Labs: 06/17/2022: Magnesium 2.0 07/09/2022: ALT 14; B Natriuretic Peptide 2,223.4 07/12/2022: Hemoglobin 13.6; Platelets 159 08/02/2022: BUN 29; Creatinine, Ser 1.46; Potassium 3.0; Sodium 140  Personally reviewed   Wt Readings from Last 3 Encounters:  08/09/22 48 kg (105 lb 12.8 oz)  07/12/22 45.2 kg (99 lb 10.4 oz)  06/18/22 44.9 kg (98 lb 15.8 oz)   BP 118/64   Pulse 82   Ht _0  (1.499 m)   Wt 48 kg (105 lb 12.8 oz)   SpO2 100%   BMI 21.37 kg/m   Physical Exam General:  NAD. No resp difficulty HEENT: Normal Neck: Supple. JVP 7-8. Carotids 2+ bilat; no bruits. No lymphadenopathy or thryomegaly appreciated. Cor: PMI nondisplaced. Regular rate & rhythm. No rubs, gallops or murmurs. Lungs: Rhonchi RUL/RLL, no wheezing Abdomen: Soft, nontender, nondistended. No hepatosplenomegaly. No bruits or masses. Good bowel  sounds. Extremities: No cyanosis, clubbing, rash, edema Neuro: Alert & oriented x 3, cranial nerves grossly intact. Moves all 4 extremities w/o difficulty. Affect pleasant.  Device interrogation (personally reviewed from 08/07/22): OptiVol down, thoracic impedence up.  ECG (personally reviewed): NSR, barostim  artifact  Assessment & Plan: 1. 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  - Echo 02/12/19: EF 25-30%, RV mildly reduced - Echo 11/21 EF 20-25% RV mild HK. - Echo 2022 EF < 20% RV normal - Echo 1/23 EF 20-25% RV ok  - Has Medtronic single chamber ICD  - Echo 07/10/22: EF 20-25%, global hypokinesis, GIIDD, RV systolic function, mildly reduced. RV size normal. Mild MR, trivial TR, aortic valve w/ mild-mod regurg.  - NYHA II symptoms, volume up today by weight and OptiVol. - Increase torsemide to 60 mg daily and take metolazone 2.5 mg + 40 KCL x 1 today. - I asked her to keep torsemide at 60 mg daily unless directed by our clinic (she had been decreasing dose on her own) - Continue digoxin 0.140m daily - Continue Farxiga 10 mg daily.  - Continue spiro 25 mg daily. - Continue Corlanor 7.5 mg bid - Failed Entresto due to hyperkalemia.   - No b-blocker due to intolerance with low output and bad COPD with frequent flares/ - Not a candidate for advanced therapies with her degree of lung disease.   - Now with Barostim in place. - BMET and BNP today, repeat BMET in 7-10 days.   2. Chronic Hypoxic Respiratory Failure  - Multifactorial, former smoker, h/o small cell lung cancer treated with chemo, chest XRT, COPD and HF - She has home O2 ([employer]). - Appears back to baseline.   3. CAD  - Hx of NSTEMI/STEMI s/p Emergent CABG 02/03/18: Cath with severe 2v CAD as above with severe LV dysfunction/ICM.  - s/p Emergent CABG 02/03/18.   - Had NSTEMI and now s/p LHC 02/12/19 with DES to the ostial ramus  extending into the distal left main.  - No longer on brillinta or [facility] - No chest pain. - Continue hi-intensity statin.   4. COPD w/ exacerbation - much improved. No supp O2 requirements. No wheezing on exam   - Finished pred and abx - She now has BiPap at home.   5. Recurrent pleural effusions s/p pleurodesis - Resolved. Recent CXR ok   6. PAF - CHA2DS2/VASc is at least 4. (CHF, Vasc disease, HTN, Female).   - She has history of Afib RVR in the past in the setting of her Lung CA.  - Previously on Xarelto but stopped due to hemoptysis.  - ECG with NSR today. - Started on Eliquis in 12/22 due to renal infarct. - Continue Eliquis 2.5 mg bid. No bleeding issues. - CBC today.   7. 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 - Back on Eliquis.     8. H/o SCLC:  - s/p treatment 2015. Lost to f/u since 04/2015.  - Chest CT 02/19/18 with mass-like consolidation in R hilum concerning for recurrent tumor.  - Repeat CT 03/27/2018 -No definite findings of locally recurrent tumor in the right lung. 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 Continued chest CT surveillance is advised in 3-6 months. - S/p thoracentesis 04/2018. Cytology negative for malignancy.  - CTA 11/19 stable scarring in the right hilum consistent with emphysema. Bronchoscopy on 11/15, no obvious source of bleeding  Follow up in 2-3 months with Dr. BHaroldine Laws She has frequent monitoring by ICM, appreciate their assistance.  JAllena Katz FNP-BC 08/09/22

## 2022-08-09 ENCOUNTER — Ambulatory Visit (HOSPITAL_COMMUNITY)
Admission: RE | Admit: 2022-08-09 | Discharge: 2022-08-09 | Disposition: A | Discharge status: Home / Self Care | Payer: BC Managed Care – PPO | Source: Ambulatory Visit | Attending: Family Medicine | Admitting: Family Medicine

## 2022-08-09 ENCOUNTER — Encounter (HOSPITAL_COMMUNITY): Payer: Self-pay

## 2022-08-09 ENCOUNTER — Other Ambulatory Visit (HOSPITAL_COMMUNITY): Payer: Self-pay | Admitting: *Deleted

## 2022-08-09 VITALS — BP 118/64 | HR 82 | Ht 59.0 in | Wt 105.8 lb

## 2022-08-09 DIAGNOSIS — Z79899 Other long term (current) drug therapy: Secondary | ICD-10-CM

## 2022-08-09 DIAGNOSIS — E875 Hyperkalemia: Secondary | ICD-10-CM | POA: Diagnosis not present

## 2022-08-09 DIAGNOSIS — Z87891 Personal history of nicotine dependence: Secondary | ICD-10-CM | POA: Diagnosis not present

## 2022-08-09 DIAGNOSIS — J9611 Chronic respiratory failure with hypoxia: Secondary | ICD-10-CM

## 2022-08-09 DIAGNOSIS — N280 Ischemia and infarction of kidney: Secondary | ICD-10-CM

## 2022-08-09 DIAGNOSIS — J90 Pleural effusion, not elsewhere classified: Secondary | ICD-10-CM | POA: Diagnosis not present

## 2022-08-09 DIAGNOSIS — Z9889 Other specified postprocedural states: Secondary | ICD-10-CM | POA: Insufficient documentation

## 2022-08-09 DIAGNOSIS — I2510 Atherosclerotic heart disease of native coronary artery without angina pectoris: Secondary | ICD-10-CM

## 2022-08-09 DIAGNOSIS — C3490 Malignant neoplasm of unspecified part of unspecified bronchus or lung: Secondary | ICD-10-CM

## 2022-08-09 DIAGNOSIS — I5041 Acute combined systolic (congestive) and diastolic (congestive) heart failure: Secondary | ICD-10-CM

## 2022-08-09 DIAGNOSIS — Z7984 Long term (current) use of oral hypoglycemic drugs: Secondary | ICD-10-CM | POA: Insufficient documentation

## 2022-08-09 DIAGNOSIS — I5022 Chronic systolic (congestive) heart failure: Secondary | ICD-10-CM

## 2022-08-09 DIAGNOSIS — J440 Chronic obstructive pulmonary disease with acute lower respiratory infection: Secondary | ICD-10-CM | POA: Insufficient documentation

## 2022-08-09 DIAGNOSIS — Z951 Presence of aortocoronary bypass graft: Secondary | ICD-10-CM | POA: Insufficient documentation

## 2022-08-09 DIAGNOSIS — Z923 Personal history of irradiation: Secondary | ICD-10-CM | POA: Insufficient documentation

## 2022-08-09 DIAGNOSIS — L905 Scar conditions and fibrosis of skin: Secondary | ICD-10-CM | POA: Insufficient documentation

## 2022-08-09 DIAGNOSIS — I110 Hypertensive heart disease with heart failure: Secondary | ICD-10-CM | POA: Diagnosis present

## 2022-08-09 DIAGNOSIS — I252 Old myocardial infarction: Secondary | ICD-10-CM | POA: Insufficient documentation

## 2022-08-09 DIAGNOSIS — Z7901 Long term (current) use of anticoagulants: Secondary | ICD-10-CM | POA: Diagnosis not present

## 2022-08-09 DIAGNOSIS — I480 Paroxysmal atrial fibrillation: Secondary | ICD-10-CM

## 2022-08-09 DIAGNOSIS — J449 Chronic obstructive pulmonary disease, unspecified: Secondary | ICD-10-CM

## 2022-08-09 DIAGNOSIS — I255 Ischemic cardiomyopathy: Secondary | ICD-10-CM

## 2022-08-09 LAB — CBC
HCT: 41.2 % (ref 36.0–46.0)
Hemoglobin: 13.1 g/dL (ref 12.0–15.0)
MCH: 29.9 pg (ref 26.0–34.0)
MCHC: 31.8 g/dL (ref 30.0–36.0)
MCV: 94.1 fL (ref 80.0–100.0)
Platelets: 190 10*3/uL (ref 150–400)
RBC: 4.38 MIL/uL (ref 3.87–5.11)
RDW: 19.7 % — ABNORMAL HIGH (ref 11.5–15.5)
WBC: 5.2 10*3/uL (ref 4.0–10.5)
nRBC: 0 % (ref 0.0–0.2)

## 2022-08-09 LAB — BASIC METABOLIC PANEL
Anion gap: 5 (ref 5–15)
BUN: 15 mg/dL (ref 8–23)
CO2: 29 mmol/L (ref 22–32)
Calcium: 9.2 mg/dL (ref 8.9–10.3)
Chloride: 106 mmol/L (ref 98–111)
Creatinine, Ser: 1.15 mg/dL — ABNORMAL HIGH (ref 0.44–1.00)
GFR, Estimated: 54 mL/min — ABNORMAL LOW (ref >60)
Glucose, Bld: 153 mg/dL — ABNORMAL HIGH (ref 70–99)
Potassium: 3.6 mmol/L (ref 3.5–5.1)
Sodium: 140 mmol/L (ref 135–145)

## 2022-08-09 LAB — DIGOXIN LEVEL: Digoxin Level: <0.2 ng/mL — ABNORMAL LOW (ref 0.8–2.0)

## 2022-08-09 MED ORDER — APIXABAN 2.5 MG PO TABS: 2.5000 mg | ORAL_TABLET | Freq: Two times a day (BID) | ORAL | 3 refills | Status: DC

## 2022-08-09 NOTE — Patient Instructions (Signed)
Increase Torsemide to 60 mg daily. Take Metolazone 2.5 mg and 40 meq potassium (extra) today. Labs in 7 - 10 days. Refill for Eliquis sent. Return to see Dr. [name] in 2 - 3 months.

## 2022-08-17 ENCOUNTER — Other Ambulatory Visit (HOSPITAL_COMMUNITY): Payer: BC Managed Care – PPO

## 2022-08-21 ENCOUNTER — Ambulatory Visit (INDEPENDENT_AMBULATORY_CARE_PROVIDER_SITE_OTHER): Payer: BC Managed Care – PPO

## 2022-08-21 DIAGNOSIS — I5022 Chronic systolic (congestive) heart failure: Secondary | ICD-10-CM

## 2022-08-21 DIAGNOSIS — Z95810 Presence of automatic (implantable) cardiac defibrillator: Secondary | ICD-10-CM

## 2022-08-21 NOTE — Progress Notes (Signed)
EPIC Encounter for ICM Monitoring  Patient Name: Brittany [name] is a 61 y.o. female Date: 08/21/2022 Primary Care Physican: Traci Sermon, PA-C Primary Cardiologist: Bensimhon Electrophysiologist: Quentin Ore 05/24/2022 Weight: 101 lbs 08/07/2022 Weight: 101 lbs 08/21/2022 Weight: 100-101 lbs    Spoke with patient and heart failure questions reviewed.  Pt asymptomatic for fluid accumulation.  Reports feeling well at this time and voices no complaints.    Optivol Thoracic impedance normal since 8/30 but was suggesting fluid accumulation from 8/22-8/30.   Barostim implant.   Prescribed: Torsemide 20 mg take 3 tablets (60 mg total) by mouth once a day. Potassium 20 mEq Take 1 tablet (20 mEq total) by mouth daily. Take extra 51meq when you take higher dose of torsemide or metolazone Metolazone 2.5 mg take by mouth as needed for edema  Spironolactone 25 mg take 1 tablet (25 mg total) by mouth daily Farxiga 10 mg take 1 tablet daily   Labs: 08/22/2022 BMET scheduled 08/02/2022 Creatinine 1.46, BUN 29, Potassium 3.0, Sodium 140, GFR 41 07/12/2022 Creatinine 1.35, BUN 37, Potassium 3.5, Sodium 136, GFR 45 07/11/2022 Creatinine 1.29, BUN 33, Potassium 4.1, Sodium 136, GFR 47  07/10/2022 Creatinine 1.52, BUN 32, Potassium 4.4, Sodium 135, GFR 39  07/09/2022 Creatinine 1.10, BUN 15, Potassium 4.7, Sodium 140, GFR 49  06/20/2022 Creatinine 1.56, BUN 56, Potassium 4.1, Sodium 136, GFR 38 06/19/2022 Creatinine 1.55, BUN 46, Potassium 4.3, Sodium 134, GFR 38  06/18/2022 Creatinine 1.62, BUN 33, Potassium 3.4, Sodium 135, GFR 36  06/17/2022 Creatinine 1.55, BUN 26, Potassium 3.6, Sodium 138, GFR 38  05/24/2022 Creatinine 1.15, BUN 18, Potassium 4.3, Sodium 138, GFR 54 05/17/2022 Creatinine 1.46, BUN 33, Potassium 2.9, Sodium 137, GFR 41 A complete set of results can be found in Results Review.   Recommendations:  No changes and encouraged to call if experiencing any fluid symptoms.    Follow-up plan: ICM clinic phone appointment on 09/04/2022.   91 day device clinic remote transmission 09/20/2022.     EP/Cardiology Office Visits:  11/08/2022 with Dr [name].     Copy of ICM check sent to Dr. Quentin Ore (replacing Dr Rayann Heman).   3 month ICM trend: 08/21/2022.    12-14 Month ICM trend:     [name], RN 08/21/2022 10:21 AM

## 2022-08-22 ENCOUNTER — Ambulatory Visit (HOSPITAL_COMMUNITY)
Admission: RE | Admit: 2022-08-22 | Discharge: 2022-08-22 | Disposition: A | Discharge status: Home / Self Care | Payer: BC Managed Care – PPO | Source: Ambulatory Visit | Attending: Internal Medicine | Admitting: Internal Medicine

## 2022-08-22 DIAGNOSIS — I255 Ischemic cardiomyopathy: Secondary | ICD-10-CM | POA: Insufficient documentation

## 2022-08-22 DIAGNOSIS — I5041 Acute combined systolic (congestive) and diastolic (congestive) heart failure: Secondary | ICD-10-CM | POA: Diagnosis present

## 2022-08-22 DIAGNOSIS — Z79899 Other long term (current) drug therapy: Secondary | ICD-10-CM | POA: Insufficient documentation

## 2022-08-22 LAB — BASIC METABOLIC PANEL
Anion gap: 8 (ref 5–15)
BUN: 19 mg/dL (ref 8–23)
CO2: 28 mmol/L (ref 22–32)
Calcium: 9.8 mg/dL (ref 8.9–10.3)
Chloride: 104 mmol/L (ref 98–111)
Creatinine, Ser: 1.44 mg/dL — ABNORMAL HIGH (ref 0.44–1.00)
GFR, Estimated: 41 mL/min — ABNORMAL LOW (ref >60)
Glucose, Bld: 128 mg/dL — ABNORMAL HIGH (ref 70–99)
Potassium: 3.8 mmol/L (ref 3.5–5.1)
Sodium: 140 mmol/L (ref 135–145)

## 2022-09-04 ENCOUNTER — Other Ambulatory Visit (HOSPITAL_COMMUNITY): Payer: Self-pay

## 2022-09-04 ENCOUNTER — Telehealth: Payer: Self-pay

## 2022-09-04 ENCOUNTER — Ambulatory Visit (INDEPENDENT_AMBULATORY_CARE_PROVIDER_SITE_OTHER): Payer: BC Managed Care – PPO

## 2022-09-04 DIAGNOSIS — I5022 Chronic systolic (congestive) heart failure: Secondary | ICD-10-CM

## 2022-09-04 DIAGNOSIS — Z95810 Presence of automatic (implantable) cardiac defibrillator: Secondary | ICD-10-CM | POA: Diagnosis not present

## 2022-09-04 MED ORDER — DAPAGLIFLOZIN PROPANEDIOL 10 MG PO TABS: ORAL_TABLET | ORAL | 11 refills | Status: DC

## 2022-09-04 NOTE — Telephone Encounter (Signed)
Spoke with patient and requested to send remote manual transmission for monthly fluid level check.  She is not home at the time of the call and will send report later this evening or in the morning.

## 2022-09-05 NOTE — Telephone Encounter (Signed)
Spoke with patient and advised remote transmission was not received.  She stated she is still out of town and will send tomorrow.

## 2022-09-06 ENCOUNTER — Telehealth: Payer: Self-pay

## 2022-09-06 NOTE — Telephone Encounter (Signed)
I spoke with the patient about missed ICM transmission. She agreed to send one today or tomorrow.

## 2022-09-07 NOTE — Progress Notes (Signed)
EPIC Encounter for ICM Monitoring  Patient Name: Brittany [name] is a 61 y.o. female Date: 09/07/2022 Primary Care Physican: Traci Sermon, PA-C Primary Cardiologist: Bensimhon Electrophysiologist: Quentin Ore 05/24/2022 Weight: 101 lbs 08/07/2022 Weight: 101 lbs    Spoke with patient and heart failure questions reviewed.  Transmission results reviewed.  Pt reports a little chest fullness but nothing significant.   She has been decreasing Torsemide to 40 mg 1-2 times a week and advised to keep Torsemide at prescribed dose of 60 mg daily.     Optivol Thoracic impedance suggesting possible fluid accumulation starting 9/23 and trending back toward baseline normal.   Barostim implant.   Prescribed: Torsemide 20 mg take 3 tablets (60 mg total) by mouth once a day. Potassium 20 mEq Take 1 tablet (20 mEq total) by mouth daily. Take extra 69meq when you take higher dose of torsemide or metolazone Metolazone 2.5 mg take by mouth as needed for edema  Spironolactone 25 mg take 1 tablet (25 mg total) by mouth daily Farxiga 10 mg take 1 tablet daily   Labs: 08/02/2022 Creatinine 1.46, BUN 29, Potassium 3.0, Sodium 140, GFR 41 07/12/2022 Creatinine 1.35, BUN 37, Potassium 3.5, Sodium 136, GFR 45 07/11/2022 Creatinine 1.29, BUN 33, Potassium 4.1, Sodium 136, GFR 47  07/10/2022 Creatinine 1.52, BUN 32, Potassium 4.4, Sodium 135, GFR 39  07/09/2022 Creatinine 1.10, BUN 15, Potassium 4.7, Sodium 140, GFR 49  06/20/2022 Creatinine 1.56, BUN 56, Potassium 4.1, Sodium 136, GFR 38 06/19/2022 Creatinine 1.55, BUN 46, Potassium 4.3, Sodium 134, GFR 38  06/18/2022 Creatinine 1.62, BUN 33, Potassium 3.4, Sodium 135, GFR 36  06/17/2022 Creatinine 1.55, BUN 26, Potassium 3.6, Sodium 138, GFR 38  05/24/2022 Creatinine 1.15, BUN 18, Potassium 4.3, Sodium 138, GFR 54 05/17/2022 Creatinine 1.46, BUN 33, Potassium 2.9, Sodium 137, GFR 41 A complete set of results can be found in Results Review.    Recommendations:  She will limit salt and fluid intake.  She is traveling to the beach 9[place]30-10[place]2.  Will recheck fluid levels 10/6.   Follow-up plan: ICM clinic phone appointment on 09/15/2022 to recheck fluid levels.   91 day device clinic remote transmission 09/20/2022.     EP/Cardiology Office Visits:   11/08/2022 with Dr [name].     Copy of ICM check sent to Dr. Quentin Ore.   3 month ICM trend: 09/07/2022.    12-14 Month ICM trend:     [name], RN 09/07/2022 10:20 AM

## 2022-09-15 ENCOUNTER — Ambulatory Visit (INDEPENDENT_AMBULATORY_CARE_PROVIDER_SITE_OTHER): Payer: BC Managed Care – PPO

## 2022-09-15 DIAGNOSIS — I5022 Chronic systolic (congestive) heart failure: Secondary | ICD-10-CM

## 2022-09-15 DIAGNOSIS — Z95810 Presence of automatic (implantable) cardiac defibrillator: Secondary | ICD-10-CM

## 2022-09-15 NOTE — Progress Notes (Signed)
EPIC Encounter for ICM Monitoring  Patient Name: Brittany [name] is a 61 y.o. female Date: 09/15/2022 Primary Care Physican: Traci Sermon, PA-C Primary Cardiologist: Bensimhon Electrophysiologist: Quentin Ore 05/24/2022 Weight: 101 lbs 08/07/2022 Weight: 101 lbs 09/15/2022 Weight: 104 lbs    Spoke with patient and heart failure questions reviewed.  Transmission results reviewed.  Pt reports chest fullness and weight gain.  She reports she has been eating foods that are higher in salt and restaurant foods as well as drinking more than recommended fluid intake     Optivol Thoracic impedance suggesting possible fluid accumulation starting 9/23 and trending back toward baseline normal.   Barostim implant.   Prescribed: Torsemide 20 mg take 3 tablets (60 mg total) by mouth once a day. Potassium 20 mEq Take 1 tablet (20 mEq total) by mouth daily. Take extra 55meq when you take higher dose of torsemide or metolazone Metolazone 2.5 mg take by mouth as needed for edema  Spironolactone 25 mg take 1 tablet (25 mg total) by mouth daily Farxiga 10 mg take 1 tablet daily   Labs: 08/02/2022 Creatinine 1.46, BUN 29, Potassium 3.0, Sodium 140, GFR 41 07/12/2022 Creatinine 1.35, BUN 37, Potassium 3.5, Sodium 136, GFR 45 07/11/2022 Creatinine 1.29, BUN 33, Potassium 4.1, Sodium 136, GFR 47  07/10/2022 Creatinine 1.52, BUN 32, Potassium 4.4, Sodium 135, GFR 39  07/09/2022 Creatinine 1.10, BUN 15, Potassium 4.7, Sodium 140, GFR 49  06/20/2022 Creatinine 1.56, BUN 56, Potassium 4.1, Sodium 136, GFR 38 06/19/2022 Creatinine 1.55, BUN 46, Potassium 4.3, Sodium 134, GFR 38  06/18/2022 Creatinine 1.62, BUN 33, Potassium 3.4, Sodium 135, GFR 36  06/17/2022 Creatinine 1.55, BUN 26, Potassium 3.6, Sodium 138, GFR 38  05/24/2022 Creatinine 1.15, BUN 18, Potassium 4.3, Sodium 138, GFR 54 05/17/2022 Creatinine 1.46, BUN 33, Potassium 2.9, Sodium 137, GFR 41 A complete set of results can be found in Results  Review.   Recommendations:  Advised to take Metolazone 2.5 mg 1 tablet tomorrow morning with extra Potassium 20 mEq 30 minutes prior taking Torsemide since she has retained fluid since 9/23.   Advised to avoid restaurant foods.    Follow-up plan: ICM clinic phone appointment on 09/25/2022 to recheck fluid levels.   91 day device clinic remote transmission 09/20/2022.     EP/Cardiology Office Visits:  11/08/2022 with Dr [name].     Copy of ICM check sent to Dr. Quentin Ore.   3 month ICM trend: 09/15/2022.    12-14 Month ICM trend:     [name], RN 09/15/2022 2:29 PM

## 2022-09-20 ENCOUNTER — Ambulatory Visit (INDEPENDENT_AMBULATORY_CARE_PROVIDER_SITE_OTHER): Payer: BC Managed Care – PPO

## 2022-09-20 DIAGNOSIS — I255 Ischemic cardiomyopathy: Secondary | ICD-10-CM

## 2022-09-21 LAB — CUP PACEART REMOTE DEVICE CHECK
Battery Remaining Longevity: 99 mo
Battery Voltage: 3.01 V
Brady Statistic RV Percent Paced: 0.01 %
Date Time Interrogation Session: 20231012110323
HighPow Impedance: 78 Ohm
Implantable Lead Implant Date: 20190905
Implantable Lead Location: 753860
Implantable Pulse Generator Implant Date: 20190905
Lead Channel Impedance Value: 380 Ohm
Lead Channel Impedance Value: 494 Ohm
Lead Channel Pacing Threshold Amplitude: 0.75 V
Lead Channel Pacing Threshold Pulse Width: 0.4 ms
Lead Channel Sensing Intrinsic Amplitude: 27.875 mV
Lead Channel Sensing Intrinsic Amplitude: 27.875 mV
Lead Channel Setting Pacing Amplitude: 2 V
Lead Channel Setting Pacing Pulse Width: 0.4 ms
Lead Channel Setting Sensing Sensitivity: 0.3 mV

## 2022-09-25 ENCOUNTER — Ambulatory Visit (INDEPENDENT_AMBULATORY_CARE_PROVIDER_SITE_OTHER): Payer: BC Managed Care – PPO

## 2022-09-25 DIAGNOSIS — Z95810 Presence of automatic (implantable) cardiac defibrillator: Secondary | ICD-10-CM

## 2022-09-25 DIAGNOSIS — I5022 Chronic systolic (congestive) heart failure: Secondary | ICD-10-CM

## 2022-09-26 NOTE — Progress Notes (Signed)
Received: 1 week ago Bensimhon, Shaune Pascal, MD  Elbridge Magowan Panda, RN Agree with metolazone.  Thanks

## 2022-09-29 NOTE — Progress Notes (Signed)
EPIC Encounter for ICM Monitoring  Patient Name: Brittany [name] is a 61 y.o. female Date: 09/29/2022 Primary Care Physican: Traci Sermon, PA-C Primary Cardiologist: Bensimhon Electrophysiologist: Quentin Ore 05/24/2022 Weight: 101 lbs 08/07/2022 Weight: 101 lbs 09/15/2022 Weight: 104 lbs    SSpoke with patient and heart failure questions reviewed.  Transmission results reviewed.  Pt asymptomatic for fluid accumulation.  Reports feeling well at this time and voices no complaints.     Optivol Thoracic impedance suggesting fluid levels returned to normal after taking Metolazone.   Barostim implant.   Prescribed: Torsemide 20 mg take 3 tablets (60 mg total) by mouth once a day. Potassium 20 mEq Take 1 tablet (20 mEq total) by mouth daily. Take extra 62meq when you take higher dose of torsemide or metolazone Metolazone 2.5 mg take by mouth as needed for edema  Spironolactone 25 mg take 1 tablet (25 mg total) by mouth daily Farxiga 10 mg take 1 tablet daily   Labs: 08/02/2022 Creatinine 1.46, BUN 29, Potassium 3.0, Sodium 140, GFR 41 07/12/2022 Creatinine 1.35, BUN 37, Potassium 3.5, Sodium 136, GFR 45 07/11/2022 Creatinine 1.29, BUN 33, Potassium 4.1, Sodium 136, GFR 47  07/10/2022 Creatinine 1.52, BUN 32, Potassium 4.4, Sodium 135, GFR 39  07/09/2022 Creatinine 1.10, BUN 15, Potassium 4.7, Sodium 140, GFR 49  06/20/2022 Creatinine 1.56, BUN 56, Potassium 4.1, Sodium 136, GFR 38 06/19/2022 Creatinine 1.55, BUN 46, Potassium 4.3, Sodium 134, GFR 38  06/18/2022 Creatinine 1.62, BUN 33, Potassium 3.4, Sodium 135, GFR 36  06/17/2022 Creatinine 1.55, BUN 26, Potassium 3.6, Sodium 138, GFR 38  05/24/2022 Creatinine 1.15, BUN 18, Potassium 4.3, Sodium 138, GFR 54 05/17/2022 Creatinine 1.46, BUN 33, Potassium 2.9, Sodium 137, GFR 41 A complete set of results can be found in Results Review.   Recommendations:  No changes and encouraged to call if experiencing any fluid symptoms.    Follow-up plan: ICM clinic phone appointment on 10/23/2022.   91 day device clinic remote transmission 12/20/2022.     EP/Cardiology Office Visits:  11/08/2022 with Dr [name].     Copy of ICM check sent to Dr. Quentin Ore.   3 month ICM trend: 09/25/2022.    12-14 Month ICM trend:     [name], RN 09/29/2022 4:02 PM

## 2022-10-03 NOTE — Progress Notes (Signed)
Remote ICD transmission.   

## 2022-10-06 ENCOUNTER — Other Ambulatory Visit (HOSPITAL_COMMUNITY): Payer: Self-pay | Admitting: Internal Medicine

## 2022-10-07 ENCOUNTER — Other Ambulatory Visit: Payer: Self-pay | Admitting: Student

## 2022-10-07 MED ORDER — IVABRADINE HCL 7.5 MG PO TABS: 7.5000 mg | ORAL_TABLET | Freq: Two times a day (BID) | ORAL | 1 refills | Status: DC

## 2022-10-10 ENCOUNTER — Other Ambulatory Visit (HOSPITAL_COMMUNITY): Payer: Self-pay

## 2022-10-10 MED ORDER — IVABRADINE HCL 7.5 MG PO TABS: 7.5000 mg | ORAL_TABLET | Freq: Two times a day (BID) | ORAL | 1 refills | Status: DC

## 2022-10-23 ENCOUNTER — Ambulatory Visit (INDEPENDENT_AMBULATORY_CARE_PROVIDER_SITE_OTHER): Payer: BC Managed Care – PPO

## 2022-10-23 DIAGNOSIS — Z95810 Presence of automatic (implantable) cardiac defibrillator: Secondary | ICD-10-CM | POA: Diagnosis not present

## 2022-10-23 DIAGNOSIS — I5022 Chronic systolic (congestive) heart failure: Secondary | ICD-10-CM

## 2022-10-27 NOTE — Progress Notes (Signed)
EPIC Encounter for ICM Monitoring  Patient Name: Brittany [name] is a 61 y.o. female Date: 10/27/2022 Primary Care Physican: Traci Sermon, PA-C Primary Cardiologist: Bensimhon Electrophysiologist: Quentin Ore 05/24/2022 Weight: 101 lbs 08/07/2022 Weight: 101 lbs 09/15/2022 Weight: 104 lbs    Spoke with patient and heart failure questions reviewed.  Transmission results reviewed.  Pt asymptomatic for fluid accumulation.  Reports feeling well at this time and voices no complaints.     Optivol Thoracic impedance suggesting fluid levels returned to normal after taking Metolazone.   Barostim implant.   Prescribed: Torsemide 20 mg take 3 tablets (60 mg total) by mouth once a day. Potassium 20 mEq Take 1 tablet (20 mEq total) by mouth daily. Take extra 81meq when you take higher dose of torsemide or metolazone Metolazone 2.5 mg take by mouth as needed for edema  Spironolactone 25 mg take 1 tablet (25 mg total) by mouth daily Farxiga 10 mg take 1 tablet daily   Labs: 08/22/2022 Creatinine 1.44, BUN 19, Potassium 3.8, Sodium 140, GFR 41 08/09/2022 Creatinine 1.15, BUN 15, Potassium 3.6, Sodium 140 GFR 54 08/02/2022 Creatinine 1.46, BUN 29, Potassium 3.0, Sodium 140, GFR 41 07/12/2022 Creatinine 1.35, BUN 37, Potassium 3.5, Sodium 136, GFR 45 07/11/2022 Creatinine 1.29, BUN 33, Potassium 4.1, Sodium 136, GFR 47  07/10/2022 Creatinine 1.52, BUN 32, Potassium 4.4, Sodium 135, GFR 39  07/09/2022 Creatinine 1.10, BUN 15, Potassium 4.7, Sodium 140, GFR 49  06/20/2022 Creatinine 1.56, BUN 56, Potassium 4.1, Sodium 136, GFR 38 06/19/2022 Creatinine 1.55, BUN 46, Potassium 4.3, Sodium 134, GFR 38  06/18/2022 Creatinine 1.62, BUN 33, Potassium 3.4, Sodium 135, GFR 36  06/17/2022 Creatinine 1.55, BUN 26, Potassium 3.6, Sodium 138, GFR 38  05/24/2022 Creatinine 1.15, BUN 18, Potassium 4.3, Sodium 138, GFR 54 05/17/2022 Creatinine 1.46, BUN 33, Potassium 2.9, Sodium 137, GFR 41 A complete set  of results can be found in Results Review.   Recommendations:  No changes and encouraged to call if experiencing any fluid symptoms.   Follow-up plan: ICM clinic phone appointment on 11/27/2022.   91 day device clinic remote transmission 12/20/2022.     EP/Cardiology Office Visits:  11/08/2022 with Dr [name].     Copy of ICM check sent to Dr. Quentin Ore.   3 month ICM trend: 10/23/2022.    12-14 Month ICM trend:     [name], RN 10/27/2022 3:21 PM

## 2022-11-08 ENCOUNTER — Ambulatory Visit (HOSPITAL_COMMUNITY)
Admission: RE | Admit: 2022-11-08 | Discharge: 2022-11-08 | Disposition: A | Discharge status: Home / Self Care | Payer: BC Managed Care – PPO | Source: Ambulatory Visit | Attending: Internal Medicine | Admitting: Internal Medicine

## 2022-11-08 ENCOUNTER — Encounter (HOSPITAL_COMMUNITY): Payer: Self-pay | Admitting: Internal Medicine

## 2022-11-08 VITALS — BP 110/68 | HR 68 | Wt 109.2 lb

## 2022-11-08 DIAGNOSIS — I252 Old myocardial infarction: Secondary | ICD-10-CM | POA: Diagnosis not present

## 2022-11-08 DIAGNOSIS — N280 Ischemia and infarction of kidney: Secondary | ICD-10-CM

## 2022-11-08 DIAGNOSIS — J449 Chronic obstructive pulmonary disease, unspecified: Secondary | ICD-10-CM | POA: Diagnosis not present

## 2022-11-08 DIAGNOSIS — Z9889 Other specified postprocedural states: Secondary | ICD-10-CM | POA: Diagnosis not present

## 2022-11-08 DIAGNOSIS — J441 Chronic obstructive pulmonary disease with (acute) exacerbation: Secondary | ICD-10-CM | POA: Insufficient documentation

## 2022-11-08 DIAGNOSIS — Z951 Presence of aortocoronary bypass graft: Secondary | ICD-10-CM | POA: Diagnosis not present

## 2022-11-08 DIAGNOSIS — I2510 Atherosclerotic heart disease of native coronary artery without angina pectoris: Secondary | ICD-10-CM

## 2022-11-08 DIAGNOSIS — E875 Hyperkalemia: Secondary | ICD-10-CM | POA: Insufficient documentation

## 2022-11-08 DIAGNOSIS — Z923 Personal history of irradiation: Secondary | ICD-10-CM | POA: Diagnosis not present

## 2022-11-08 DIAGNOSIS — Z87891 Personal history of nicotine dependence: Secondary | ICD-10-CM | POA: Insufficient documentation

## 2022-11-08 DIAGNOSIS — I480 Paroxysmal atrial fibrillation: Secondary | ICD-10-CM | POA: Diagnosis not present

## 2022-11-08 DIAGNOSIS — J9611 Chronic respiratory failure with hypoxia: Secondary | ICD-10-CM | POA: Insufficient documentation

## 2022-11-08 DIAGNOSIS — Z79899 Other long term (current) drug therapy: Secondary | ICD-10-CM | POA: Insufficient documentation

## 2022-11-08 DIAGNOSIS — Z7901 Long term (current) use of anticoagulants: Secondary | ICD-10-CM | POA: Insufficient documentation

## 2022-11-08 DIAGNOSIS — I110 Hypertensive heart disease with heart failure: Secondary | ICD-10-CM | POA: Insufficient documentation

## 2022-11-08 DIAGNOSIS — J440 Chronic obstructive pulmonary disease with acute lower respiratory infection: Secondary | ICD-10-CM | POA: Insufficient documentation

## 2022-11-08 DIAGNOSIS — I5022 Chronic systolic (congestive) heart failure: Secondary | ICD-10-CM | POA: Diagnosis not present

## 2022-11-08 LAB — BRAIN NATRIURETIC PEPTIDE: B Natriuretic Peptide: 1395.6 pg/mL — ABNORMAL HIGH (ref 0.0–100.0)

## 2022-11-08 LAB — CBC
HCT: 44.4 % (ref 36.0–46.0)
Hemoglobin: 13.8 g/dL (ref 12.0–15.0)
MCH: 29.2 pg (ref 26.0–34.0)
MCHC: 31.1 g/dL (ref 30.0–36.0)
MCV: 94.1 fL (ref 80.0–100.0)
Platelets: 188 10*3/uL (ref 150–400)
RBC: 4.72 MIL/uL (ref 3.87–5.11)
RDW: 18.6 % — ABNORMAL HIGH (ref 11.5–15.5)
WBC: 6.5 10*3/uL (ref 4.0–10.5)
nRBC: 0 % (ref 0.0–0.2)

## 2022-11-08 LAB — BASIC METABOLIC PANEL
Anion gap: 9 (ref 5–15)
BUN: 26 mg/dL — ABNORMAL HIGH (ref 8–23)
CO2: 24 mmol/L (ref 22–32)
Calcium: 9.6 mg/dL (ref 8.9–10.3)
Chloride: 106 mmol/L (ref 98–111)
Creatinine, Ser: 1.19 mg/dL — ABNORMAL HIGH (ref 0.44–1.00)
GFR, Estimated: 52 mL/min — ABNORMAL LOW (ref >60)
Glucose, Bld: 129 mg/dL — ABNORMAL HIGH (ref 70–99)
Potassium: 3.2 mmol/L — ABNORMAL LOW (ref 3.5–5.1)
Sodium: 139 mmol/L (ref 135–145)

## 2022-11-08 MED ORDER — PREDNISONE 10 MG PO TABS: ORAL_TABLET | ORAL | 0 refills | Status: DC

## 2022-11-08 NOTE — Addendum Note (Signed)
Encounter addended by: Jerl Mina, RN on: 11/08/2022 2:34 PM  Actions taken: Diagnosis association updated, Order list changed, Clinical Note Signed, Charge Capture section accepted

## 2022-11-08 NOTE — Patient Instructions (Signed)
START Prednisone as directed on the bottle  40 mg (4 Tabs) for 2 days 30 mg (3 Tabs) for 2 days 20 mg (2 Tabs) for 2 days 10 mg (1 Tab) for 2 days Then stop  Labs done today, your results will be available in MyChart, we will contact you for abnormal readings.  Your physician recommends that you schedule a follow-up appointment as scheduled  If you have any questions or concerns before your next appointment please send [place] a message through [facility] or call our office at [phone].    TO LEAVE A MESSAGE FOR THE NURSE SELECT OPTION 2, PLEASE LEAVE A MESSAGE INCLUDING: YOUR NAME DATE OF BIRTH CALL BACK NUMBER REASON FOR CALL**this is important as we prioritize the call backs  YOU WILL RECEIVE A CALL BACK THE SAME DAY AS LONG AS YOU CALL BEFORE 4:00 PM  At the [facility], you and your health needs are our priority. As part of our continuing mission to provide you with exceptional heart care, we have created designated Provider Care Teams. These Care Teams include your primary Cardiologist (physician) and Advanced Practice Providers (APPs- Physician Assistants and Nurse Practitioners) who all work together to provide you with the care you need, when you need it.   You may see any of the following providers on your designated Care Team at your next follow up: Dr Glori Bickers Dr Loralie Champagne Dr. Roxana Hires, NP Lyda Jester, [place] [facility], [facility] [place], [place] Forestine Na, NP Audry Riles, PharmD   Please be sure to bring in all your medications bottles to every appointment.

## 2022-11-08 NOTE — Progress Notes (Signed)
ReDS Vest / Clip - 11/08/22 1400       ReDS Vest / Clip   Station Marker A    Ruler Value 24    ReDS Value Range Low volume    ReDS Actual Value 30

## 2022-11-08 NOTE — Progress Notes (Signed)
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 Underwent emergent cath 02/03/18 showed LAD 100% stenosed, LCx 95% stenosed. Taken for emergent CABG 02/03/18. Required impella post op. Hospital course complicated by cardiogenic shock, HCAP, A fib, respiratory failure, and swallowing issues. She was discharged to SNF. Discharge weight 103 pounds.    In 2019 had multiple hospitalizations for HF and pleural effusion. Underwent pleurodesis at [facility].   Admitted 3/20 with NSTEMI and HF. Received DES to ostial ramus into distal left main based on cath below and underwent diuresis. Meds adjusted as tolerated. Echo with EF 25-30%.   Admitted 11/21 with COPD flare with mild HF and hemoptysis. CTA showed stable scarring in the right hilum consistent with emphysema.. Underwent bronchoscopy on 11/15, no obvious source of bleeding found on examination.   Echo 11/21 EF 20-25%. RV mildly HK.   Admitted 3/22 Echo with EF < 20%. Treated for AECOPD exacerbation.   Admitted 12/22 with R renal infarct felt to be cardio-embolic (no AF found). Started on Eliquis  Admitted 8/23 w/ a/c respiratory failure and CHF. Concern for low output vs PNA vs COPD exacerbation. PICC placed and milrinone started. Diuresed w/ IV Lasix. Given prednisone taper and doxy for COPD exacerbation. Echo showed EF 20-25%, global hypokinesis, GIIDD, RV systolic function, mildly reduced. RV size normal. Mild MR, trivial TR, aortic valve w/ mild-mod regurg. Drips weaned, GDMT adjusted, discharged home on higher torsemide  dose of 60 mg daily, weight 99 lbs.  Barostim placed.   Today she returns for follow-up. Says Barostim has really helped. Much easier to do ADLs. Now can go to store instead of having to order online. Says it has changed her life. Occasionally SOB. ReDS 30%. No edema, orthopnea or PND.    Cardidac Studies: - Echo (7/23): EF 20-25%, global hypokinesis, GIIDD, RV systolic function, mildly reduced. RV size normal. Mild MR, trivial TR, aortic valve w/ mild-mod regurg - Echo 1/23: EF 20-25%, severely reduced LV with global HK, grade II DD, normal RV - Echo 02/10/21: EF <20%, G2DD - Echo 2022: EF < 20% RV normal  - Echo 2021: EF 20-25% RV mildly reduced.  - Echo 02/12/19: EF 25-30%, RV mildly reduced  - 05/18/2021 PFT's: moderately severe airway obstruction and a diffusion defect suggesting emphysema.   - LHC 02/12/19: Ost LAD to Prox LAD lesion is 100% stenosed. Ost Cx to Prox Cx lesion is 100% stenosed. Ost Ramus lesion is 99% stenosed. Post intervention, there is a 0% residual stenosis. A drug-eluting stent was successfully placed using a Brooke 2.25X15. SVG and is normal in caliber. The graft exhibits no disease. Ost 1st Diag lesion is 70% stenosed. SVG and is normal in caliber. Prox Graft lesion is 100% stenosed. 1.  Significant underlying two-vessel coronary artery disease with patent SVG to OM and SVG to diagonal which supplies the LAD territory.  Atretic LIMA to LAD given competitive flow from SVG to diagonal.  Severe ostial ramus artery stenosis not supplied by bypass with his area suggestive of plaque rupture. 2.  Severely elevated left ventricular end-diastolic pressure 34 mmHg.  Left ventricular angiography was not performed. 3.  Successful angioplasty and drug-eluting stent placement to the ostial ramus extending into the distal left main.  [place] 2019  RA = 8 RV = 49/9 PA = 47/20 (32) PCW = 19 Fick cardiac output/index = 4.3/3.1 PVR = 3.0 wu AO sat = 93% PA sat =  62%, 64%   1. Minimally elevated left heart pressures 2. Mild PAH 3. Normal cardiac output  Past Medical History:  Diagnosis Date   Acute respiratory failure ([place]) 29/47/6546   Acute systolic congestive heart failure ([place]) 02/03/2018   AICD (automatic cardioverter/defibrillator) present    Allergy    Anxiety    Asthma    DM2 (diabetes mellitus, type 2) ([place]) 10/20/2020   Hypertension    PAF (paroxysmal atrial fibrillation) ([place])    Presence of permanent cardiac pacemaker    Prophylactic measure 08/03/14-08/19/14   Prophyl. 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   LIMA to LAD, SVG to D1, SVG to OM1, EVH via right thigh with implantation of Impella LD LVAD via direct aortic approach   Small cell lung cancer ([place]) 03/16/2014   Past Surgical History:  Procedure Laterality Date   BRONCHIAL BRUSHINGS  10/25/2020   Procedure: BRONCHIAL BRUSHINGS;  Surgeon: Collene Gobble, MD;  Location: [facility] ENDOSCOPY;  Service: Pulmonary;;   BRONCHIAL NEEDLE ASPIRATION BIOPSY  10/25/2020   Procedure: BRONCHIAL NEEDLE ASPIRATION BIOPSIES;  Surgeon: Collene Gobble, MD;  Location: [facility] ENDOSCOPY;  Service: Pulmonary;;   CARDIAC DEFIBRILLATOR PLACEMENT  08/15/2018   MDT Visia AF MRI VR ICD implanted by Dr Loel Lofty for primary prevention of sudden   CESAREAN SECTION     CORONARY ARTERY BYPASS GRAFT N/A 02/03/2018   Procedure: CORONARY ARTERY BYPASS GRAFTING (CABG);  Surgeon: Rexene Alberts, MD;  Location: [place];  Service: Open Heart Surgery;  Laterality: N/A;  Time 3 using left internal mammary artery and endoscopically harvested right saphenous vein   CORONARY BALLOON ANGIOPLASTY N/A 02/03/2018   Procedure: CORONARY BALLOON ANGIOPLASTY;  Surgeon: Martinique, Peter M, MD;  Location: [place] CV LAB;  Service: Cardiovascular;  Laterality: N/A;   CORONARY STENT INTERVENTION N/A 02/12/2019   Procedure: CORONARY STENT INTERVENTION;  Surgeon: Wellington Hampshire, MD;  Location: [place] CV LAB;   Service: Cardiovascular;  Laterality: N/A;   CORONARY/GRAFT ACUTE MI REVASCULARIZATION N/A 02/03/2018   Procedure: Coronary/Graft Acute MI Revascularization;  Surgeon: Martinique, Peter M, MD;  Location: [place] CV LAB;  Service: Cardiovascular;  Laterality: N/A;   ENDOBRONCHIAL ULTRASOUND N/A 10/25/2020   Procedure: ENDOBRONCHIAL ULTRASOUND;  Surgeon: Collene Gobble, MD;  Location: [facility] ENDOSCOPY;  Service: Pulmonary;  Laterality: N/A;   FLEXIBLE BRONCHOSCOPY  10/25/2020   Procedure: FLEXIBLE BRONCHOSCOPY;  Surgeon: Collene Gobble, MD;  Location: [facility] ENDOSCOPY;  Service: Pulmonary;;   IABP INSERTION N/A 02/03/2018   Procedure: IABP Insertion;  Surgeon: Martinique, Peter M, MD;  Location: [place] CV LAB;  Service: Cardiovascular;  Laterality: N/A;   INTRAOPERATIVE TRANSESOPHAGEAL ECHOCARDIOGRAM N/A 02/03/2018   Procedure: INTRAOPERATIVE TRANSESOPHAGEAL ECHOCARDIOGRAM;  Surgeon: Rexene Alberts, MD;  Location: [place];  Service: Open Heart Surgery;  Laterality: N/A;   LEFT HEART CATH AND CORONARY ANGIOGRAPHY N/A 02/03/2018   Procedure: LEFT HEART CATH AND CORONARY ANGIOGRAPHY;  Surgeon: Martinique, Peter M, MD;  Location: [place] CV LAB;  Service: Cardiovascular;  Laterality: N/A;   LEFT HEART CATH AND CORS/GRAFTS ANGIOGRAPHY N/A  02/12/2019   Procedure: LEFT HEART CATH AND CORS/GRAFTS ANGIOGRAPHY;  Surgeon: Wellington Hampshire, MD;  Location: [place] CV LAB;  Service: Cardiovascular;  Laterality: N/A;   MEDIASTINOSCOPY N/A 03/11/2014   Procedure: MEDIASTINOSCOPY;  Surgeon: Melrose Nakayama, MD;  Location: [place];  Service: Thoracic;  Laterality: N/A;   PLACEMENT OF IMPELLA LEFT VENTRICULAR ASSIST DEVICE  02/03/2018   Procedure: PLACEMENT OF [place] LEFT VENTRICULAR ASSIST DEVICE LD;  Surgeon: Rexene Alberts, MD;  Location: [place];  Service: Open Heart Surgery;;   REMOVAL OF [place] LEFT VENTRICULAR ASSIST DEVICE N/A 02/08/2018   Procedure: REMOVAL OF [place] LEFT VENTRICULAR ASSIST DEVICE;  Surgeon:  Rexene Alberts, MD;  Location: [place];  Service: Open Heart Surgery;  Laterality: N/A;   RIGHT HEART CATH N/A 02/03/2018   Procedure: RIGHT HEART CATH;  Surgeon: Martinique, Peter M, MD;  Location: [place] CV LAB;  Service: Cardiovascular;  Laterality: N/A;   RIGHT HEART CATH N/A 05/09/2018   Procedure: RIGHT HEART CATH;  Surgeon: Jolaine Artist, MD;  Location: [place] CV LAB;  Service: Cardiovascular;  Laterality: N/A;   TEE WITHOUT CARDIOVERSION N/A 02/08/2018   Procedure: TRANSESOPHAGEAL ECHOCARDIOGRAM (TEE);  Surgeon: Rexene Alberts, MD;  Location: [place];  Service: Open Heart Surgery;  Laterality: N/A;   TUBAL LIGATION     VIDEO BRONCHOSCOPY WITH ENDOBRONCHIAL ULTRASOUND N/A 03/11/2014   Procedure: VIDEO BRONCHOSCOPY WITH ENDOBRONCHIAL ULTRASOUND;  Surgeon: Melrose Nakayama, MD;  Location: [facility] OR;  Service: Thoracic;  Laterality: N/A;   Current Outpatient Medications  Medication Sig Dispense Refill   albuterol (VENTOLIN HFA) 108 (90 Base) MCG/ACT inhaler Inhale 2 puffs into the lungs every 6 (six) hours as needed for wheezing or shortness of breath. 8.5 g 1   apixaban (ELIQUIS) 2.5 MG TABS tablet Take 1 tablet (2.5 mg total) by mouth 2 (two) times daily. 180 tablet 3   atorvastatin (LIPITOR) 80 MG tablet Take 1 tablet (80 mg total) by mouth daily at 6 PM. 30 tablet 1   blood glucose meter kit and supplies KIT Dispense based on patient and insurance preference. Use up to four times daily as directed. 1 each 0   dapagliflozin propanediol (FARXIGA) 10 MG TABS tablet Take 1 tablet by mouth daily. 30 tablet 11   digoxin (LANOXIN) 0.125 MG tablet Take 1 tablet (0.125 mg total) by mouth daily. 30 tablet 5   Glucerna (GLUCERNA) LIQD Take 237 mLs by mouth See admin instructions. Takes 1 bottle twice a week     ipratropium-albuterol (DUONEB) 0.5-2.5 (3) MG/3ML SOLN Take 3 mLs by nebulization every 4 (four) hours as needed (for shortness of breath). 90 mL 3   ivabradine (CORLANOR) 7.5 MG TABS  tablet Take 1 tablet (7.5 mg total) by mouth 2 (two) times daily with a meal. TAKE 1 TABLET (7.5 MG TOTAL) BY MOUTH 2 (TWO) TIMES DAILY WITH A MEAL. 180 tablet 1   metolazone (ZAROXOLYN) 2.5 MG tablet TAKE 1 TABLET BY MOUTH AS DIRECTED. TAKE TODAY AND TOMORROW AND THEN HOLD TILL DIRECTED 10 tablet 1   Multiple Vitamin (MULTIVITAMIN WITH MINERALS) TABS tablet Take 1 tablet by mouth daily.     naphazoline-pheniramine (VISINE) 0.025-0.3 % ophthalmic solution Place 1 drop into both eyes 4 (four) times daily as needed for eye irritation.     nitroGLYCERIN (NITROSTAT) 0.4 MG SL tablet Place 1 tablet (0.4 mg total) under the tongue every 5 (five) minutes as needed for chest pain. 90 tablet 5  potassium chloride SA (KLOR-CON M) 20 MEQ tablet Take 1 tablet (20 mEq total) by mouth daily. TAke extra 38mq when you take higher dose of torsemide or metolazone 45 tablet 6   spironolactone (ALDACTONE) 25 MG tablet Take 1 tablet (25 mg total) by mouth daily. 45 tablet 3   torsemide (DEMADEX) 20 MG tablet Take 3 tablets (60 mg total) by mouth daily. 90 tablet 5   No current facility-administered medications for this encounter.    Allergies:   Codeine   Social History:  The patient  reports that she quit smoking about 8 years ago. Her smoking use included cigarettes. She has a 20.00 pack-year smoking history. She has never used smokeless tobacco. She reports current alcohol use of about 8.0 standard drinks of alcohol per week. She reports current drug use. Drug: Marijuana.   Family History:  The patient's family history includes Cancer in her maternal grandmother; Diabetes in her paternal grandmother; Heart attack in her mother; Heart disease in her mother; Hypertension in her maternal grandmother and mother.   ROS:  Please see the history of present illness.   All other systems are personally reviewed and negative.    Recent Labs: 06/17/2022: Magnesium 2.0 07/09/2022: ALT 14; B Natriuretic Peptide  2,223.4 08/09/2022: Hemoglobin 13.1; Platelets 190 08/22/2022: BUN 19; Creatinine, Ser 1.44; Potassium 3.8; Sodium 140  Personally reviewed   Wt Readings from Last 3 Encounters:  11/08/22 49.5 kg (109 lb 3.2 oz)  08/09/22 48 kg (105 lb 12.8 oz)  07/12/22 45.2 kg (99 lb 10.4 oz)   BP 110/68   Pulse 68   Wt 49.5 kg (109 lb 3.2 oz)   SpO2 98%   BMI 22.06 kg/m   Physical Exam General:  Well appearing. No resp difficulty HEENT: normal Neck: supple. no JVD. Carotids 2+ bilat; no bruits. No lymphadenopathy or thryomegaly appreciated. Cor: PMI nondisplaced. Regular rate & rhythm. No rubs, gallops or murmurs. Lungs: + rhonchi decreased throughout Abdomen: soft, nontender, nondistended. No hepatosplenomegaly. No bruits or masses. Good bowel sounds. Extremities: no cyanosis, clubbing, rash, edema Neuro: alert & orientedx3, cranial nerves grossly intact. moves all 4 extremities w/o difficulty. Affect pleasant   Device interrogation: No VT/AF. Volume ok Activity level increased to 2-3hr/day Personally reviewed   Assessment & Plan:  1. 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  - Echo 02/12/19: EF 25-30%, RV mildly reduced - Echo 11/21 EF 20-25% RV mild HK. - Echo 2022 EF < 20% RV normal - Echo 1/23 EF 20-25% RV ok  - Has Med[employer]onic single chamber ICD  - Echo 07/10/22: EF 20-25%, global hypokinesis, GIIDD, RV systolic function, mildly reduced. RV size normal. Mild MR, trivial TR, aortic valve w/ mild-mod regurg.  - Improved NYHA II-III symptoms, volume looks good. ReDS 30% - Continue toresmide 60 mg daily and take metolazone 2.5 mg as needed - Continue digoxin 0.1239mdaily - Continue Farxiga 10 mg daily.  - Continue spiro 25 mg daily. - Continue Corlanor 7.5 mg bid - Failed Entresto due to hyperkalemia.   - No b-blocker due to intolerance with low output and bad COPD with frequent flares/ -  Not a candidate for advanced therapies with her degree of lung disease.   - Now with Barostim in place. Much improved - Labs today   2. Chronic Hypoxic Respiratory Failure  - Multifactorial, former smoker, h/o small cell lung cancer treated with  chemo, chest XRT, COPD and HF - She has home O2 ([employer]). - See below treat for mild COPD flare   3. CAD  - Hx of NSTEMI/STEMI s/p Emergent CABG 02/03/18: Cath with severe 2v CAD as above with severe LV dysfunction/ICM.  - s/p Emergent CABG 02/03/18.   - Had NSTEMI and now s/p LHC 02/12/19 with DES to the ostial ramus extending into the distal left main.  - No longer on brillinta or ASA due to bleeding and need for [facility]. - No s/s angina  - Continue hi-intensity statin.   4. COPD w/ exacerbation - she is rhonchorous today. No fevers/chills or productive - Not using Bipap. - Will do short steroid taper   5. Recurrent pleural effusions s/p pleurodesis - Resolved. Recent CXR ok   6. PAF - CHA2DS2/VASc is at least 4. (CHF, Vasc disease, HTN, Female).   - She has history of Afib RVR in the past in the setting of her Lung CA.  - Previously on Xarelto but stopped due to hemoptysis.  - Remains in NSR - Started on Eliquis in 12/22 due to renal infarct. - Continue Eliquis 2.5 mg bid. No bleeding issues. - CBC today.   7. 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 - Remains on Eliquis.     8. H/o SCLC:  - s/p treatment 2015. Lost to f/u since 04/2015.  - Chest CT 02/19/18 with mass-like consolidation in R hilum concerning for recurrent tumor.  - Repeat CT 03/27/2018 -No definite findings of locally recurrent tumor in the right lung. 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 Continued chest CT surveillance is advised in 3-6 months. - S/p thoracentesis 04/2018. Cytology negative for malignancy.  - CTA 11/19 stable scarring in the right hilum consistent with emphysema.  Bronchoscopy on 11/15, no obvious source of bleeding  Glori Bickers, MD  2:10 PM

## 2022-11-14 ENCOUNTER — Inpatient Hospital Stay (HOSPITAL_COMMUNITY)
Admission: EM | Admit: 2022-11-14 | Discharge: 2022-11-24 | DRG: 871 | Disposition: A | Discharge status: Home Health | Payer: BC Managed Care – PPO | Attending: Internal Medicine | Admitting: Internal Medicine

## 2022-11-14 ENCOUNTER — Emergency Department (HOSPITAL_COMMUNITY): Payer: BC Managed Care – PPO

## 2022-11-14 ENCOUNTER — Encounter (HOSPITAL_COMMUNITY): Payer: Self-pay

## 2022-11-14 DIAGNOSIS — Z951 Presence of aortocoronary bypass graft: Secondary | ICD-10-CM

## 2022-11-14 DIAGNOSIS — Z955 Presence of coronary angioplasty implant and graft: Secondary | ICD-10-CM

## 2022-11-14 DIAGNOSIS — N280 Ischemia and infarction of kidney: Secondary | ICD-10-CM | POA: Diagnosis present

## 2022-11-14 DIAGNOSIS — J159 Unspecified bacterial pneumonia: Secondary | ICD-10-CM | POA: Diagnosis present

## 2022-11-14 DIAGNOSIS — I5043 Acute on chronic combined systolic (congestive) and diastolic (congestive) heart failure: Secondary | ICD-10-CM | POA: Diagnosis not present

## 2022-11-14 DIAGNOSIS — I252 Old myocardial infarction: Secondary | ICD-10-CM

## 2022-11-14 DIAGNOSIS — J439 Emphysema, unspecified: Secondary | ICD-10-CM | POA: Diagnosis present

## 2022-11-14 DIAGNOSIS — E875 Hyperkalemia: Secondary | ICD-10-CM | POA: Diagnosis present

## 2022-11-14 DIAGNOSIS — R54 Age-related physical debility: Secondary | ICD-10-CM | POA: Diagnosis present

## 2022-11-14 DIAGNOSIS — J9621 Acute and chronic respiratory failure with hypoxia: Secondary | ICD-10-CM | POA: Diagnosis not present

## 2022-11-14 DIAGNOSIS — I959 Hypotension, unspecified: Secondary | ICD-10-CM | POA: Diagnosis not present

## 2022-11-14 DIAGNOSIS — R7989 Other specified abnormal findings of blood chemistry: Secondary | ICD-10-CM | POA: Diagnosis not present

## 2022-11-14 DIAGNOSIS — Z1152 Encounter for screening for COVID-19: Secondary | ICD-10-CM | POA: Diagnosis not present

## 2022-11-14 DIAGNOSIS — Z6821 Body mass index (BMI) 21.0-21.9, adult: Secondary | ICD-10-CM

## 2022-11-14 DIAGNOSIS — R0600 Dyspnea, unspecified: Secondary | ICD-10-CM | POA: Diagnosis not present

## 2022-11-14 DIAGNOSIS — I480 Paroxysmal atrial fibrillation: Secondary | ICD-10-CM | POA: Diagnosis present

## 2022-11-14 DIAGNOSIS — N1831 Chronic kidney disease, stage 3a: Secondary | ICD-10-CM | POA: Diagnosis present

## 2022-11-14 DIAGNOSIS — R6520 Severe sepsis without septic shock: Secondary | ICD-10-CM | POA: Diagnosis not present

## 2022-11-14 DIAGNOSIS — I130 Hypertensive heart and chronic kidney disease with heart failure and stage 1 through stage 4 chronic kidney disease, or unspecified chronic kidney disease: Secondary | ICD-10-CM | POA: Diagnosis present

## 2022-11-14 DIAGNOSIS — A4189 Other specified sepsis: Secondary | ICD-10-CM | POA: Diagnosis present

## 2022-11-14 DIAGNOSIS — Z923 Personal history of irradiation: Secondary | ICD-10-CM

## 2022-11-14 DIAGNOSIS — Z66 Do not resuscitate: Secondary | ICD-10-CM | POA: Diagnosis not present

## 2022-11-14 DIAGNOSIS — E876 Hypokalemia: Secondary | ICD-10-CM | POA: Diagnosis not present

## 2022-11-14 DIAGNOSIS — F419 Anxiety disorder, unspecified: Secondary | ICD-10-CM | POA: Diagnosis present

## 2022-11-14 DIAGNOSIS — E1169 Type 2 diabetes mellitus with other specified complication: Secondary | ICD-10-CM

## 2022-11-14 DIAGNOSIS — Z85118 Personal history of other malignant neoplasm of bronchus and lung: Secondary | ICD-10-CM

## 2022-11-14 DIAGNOSIS — H8110 Benign paroxysmal vertigo, unspecified ear: Secondary | ICD-10-CM | POA: Diagnosis present

## 2022-11-14 DIAGNOSIS — Z7901 Long term (current) use of anticoagulants: Secondary | ICD-10-CM

## 2022-11-14 DIAGNOSIS — J440 Chronic obstructive pulmonary disease with acute lower respiratory infection: Secondary | ICD-10-CM | POA: Diagnosis present

## 2022-11-14 DIAGNOSIS — Z885 Allergy status to narcotic agent status: Secondary | ICD-10-CM

## 2022-11-14 DIAGNOSIS — Z87891 Personal history of nicotine dependence: Secondary | ICD-10-CM

## 2022-11-14 DIAGNOSIS — J9811 Atelectasis: Secondary | ICD-10-CM | POA: Diagnosis present

## 2022-11-14 DIAGNOSIS — A419 Sepsis, unspecified organism: Secondary | ICD-10-CM | POA: Diagnosis present

## 2022-11-14 DIAGNOSIS — I5022 Chronic systolic (congestive) heart failure: Secondary | ICD-10-CM | POA: Diagnosis present

## 2022-11-14 DIAGNOSIS — Z79899 Other long term (current) drug therapy: Secondary | ICD-10-CM

## 2022-11-14 DIAGNOSIS — Z9221 Personal history of antineoplastic chemotherapy: Secondary | ICD-10-CM

## 2022-11-14 DIAGNOSIS — J9601 Acute respiratory failure with hypoxia: Secondary | ICD-10-CM | POA: Diagnosis present

## 2022-11-14 DIAGNOSIS — J441 Chronic obstructive pulmonary disease with (acute) exacerbation: Secondary | ICD-10-CM | POA: Diagnosis present

## 2022-11-14 DIAGNOSIS — J1008 Influenza due to other identified influenza virus with other specified pneumonia: Secondary | ICD-10-CM | POA: Diagnosis present

## 2022-11-14 DIAGNOSIS — J09X1 Influenza due to identified novel influenza A virus with pneumonia: Secondary | ICD-10-CM | POA: Diagnosis not present

## 2022-11-14 DIAGNOSIS — I5023 Acute on chronic systolic (congestive) heart failure: Secondary | ICD-10-CM | POA: Diagnosis not present

## 2022-11-14 DIAGNOSIS — Z833 Family history of diabetes mellitus: Secondary | ICD-10-CM

## 2022-11-14 DIAGNOSIS — J1100 Influenza due to unidentified influenza virus with unspecified type of pneumonia: Secondary | ICD-10-CM | POA: Diagnosis not present

## 2022-11-14 DIAGNOSIS — Z8249 Family history of ischemic heart disease and other diseases of the circulatory system: Secondary | ICD-10-CM

## 2022-11-14 DIAGNOSIS — R112 Nausea with vomiting, unspecified: Secondary | ICD-10-CM | POA: Diagnosis not present

## 2022-11-14 DIAGNOSIS — J9611 Chronic respiratory failure with hypoxia: Secondary | ICD-10-CM

## 2022-11-14 DIAGNOSIS — Z95810 Presence of automatic (implantable) cardiac defibrillator: Secondary | ICD-10-CM

## 2022-11-14 DIAGNOSIS — I2510 Atherosclerotic heart disease of native coronary artery without angina pectoris: Secondary | ICD-10-CM | POA: Diagnosis present

## 2022-11-14 LAB — BASIC METABOLIC PANEL
Anion gap: 13 (ref 5–15)
BUN: 22 mg/dL (ref 8–23)
CO2: 23 mmol/L (ref 22–32)
Calcium: 9.5 mg/dL (ref 8.9–10.3)
Chloride: 104 mmol/L (ref 98–111)
Creatinine, Ser: 1.28 mg/dL — ABNORMAL HIGH (ref 0.44–1.00)
GFR, Estimated: 47 mL/min — ABNORMAL LOW (ref >60)
Glucose, Bld: 184 mg/dL — ABNORMAL HIGH (ref 70–99)
Potassium: 3.7 mmol/L (ref 3.5–5.1)
Sodium: 140 mmol/L (ref 135–145)

## 2022-11-14 LAB — CBC WITH DIFFERENTIAL/PLATELET
Abs Immature Granulocytes: 0.06 10*3/uL (ref 0.00–0.07)
Basophils Absolute: 0 10*3/uL (ref 0.0–0.1)
Basophils Relative: 0 %
Eosinophils Absolute: 0 10*3/uL (ref 0.0–0.5)
Eosinophils Relative: 0 %
HCT: 40.9 % (ref 36.0–46.0)
Hemoglobin: 13.3 g/dL (ref 12.0–15.0)
Immature Granulocytes: 1 %
Lymphocytes Relative: 4 %
Lymphs Abs: 0.5 10*3/uL — ABNORMAL LOW (ref 0.7–4.0)
MCH: 30.3 pg (ref 26.0–34.0)
MCHC: 32.5 g/dL (ref 30.0–36.0)
MCV: 93.2 fL (ref 80.0–100.0)
Monocytes Absolute: 0.8 10*3/uL (ref 0.1–1.0)
Monocytes Relative: 6 %
Neutro Abs: 12 10*3/uL — ABNORMAL HIGH (ref 1.7–7.7)
Neutrophils Relative %: 89 %
Platelets: 172 10*3/uL (ref 150–400)
RBC: 4.39 MIL/uL (ref 3.87–5.11)
RDW: 19.6 % — ABNORMAL HIGH (ref 11.5–15.5)
WBC: 13.3 10*3/uL — ABNORMAL HIGH (ref 4.0–10.5)
nRBC: 0 % (ref 0.0–0.2)

## 2022-11-14 LAB — I-STAT VENOUS BLOOD GAS, ED
Acid-base deficit: 1 mmol/L (ref 0.0–2.0)
Bicarbonate: 23.9 mmol/L (ref 20.0–28.0)
Calcium, Ion: 1.14 mmol/L — ABNORMAL LOW (ref 1.15–1.40)
HCT: 43 % (ref 36.0–46.0)
Hemoglobin: 14.6 g/dL (ref 12.0–15.0)
O2 Saturation: 99 %
Potassium: 3.7 mmol/L (ref 3.5–5.1)
Sodium: 140 mmol/L (ref 135–145)
TCO2: 25 mmol/L (ref 22–32)
pCO2, Ven: 37.9 mmHg — ABNORMAL LOW (ref 44–60)
pH, Ven: 7.408 (ref 7.25–7.43)
pO2, Ven: 130 mmHg — ABNORMAL HIGH (ref 32–45)

## 2022-11-14 LAB — LACTIC ACID, PLASMA
Lactic Acid, Venous: 2.7 mmol/L (ref 0.5–1.9)
Lactic Acid, Venous: 1.7 mmol/L (ref 0.5–1.9)

## 2022-11-14 LAB — TROPONIN I (HIGH SENSITIVITY)
Troponin I (High Sensitivity): 152 ng/L (ref <18)
Troponin I (High Sensitivity): 294 ng/L (ref <18)
Troponin I (High Sensitivity): 424 ng/L (ref <18)
Troponin I (High Sensitivity): 362 ng/L (ref <18)

## 2022-11-14 LAB — RESP PANEL BY RT-PCR (FLU A&B, COVID) ARPGX2
Influenza A by PCR: POSITIVE — AB
Influenza B by PCR: NEGATIVE
SARS Coronavirus 2 by RT PCR: NEGATIVE

## 2022-11-14 LAB — DIGOXIN LEVEL: Digoxin Level: 0.2 ng/mL — ABNORMAL LOW (ref 0.8–2.0)

## 2022-11-14 LAB — BRAIN NATRIURETIC PEPTIDE: B Natriuretic Peptide: 3393.9 pg/mL — ABNORMAL HIGH (ref 0.0–100.0)

## 2022-11-14 LAB — PROCALCITONIN: Procalcitonin: 8.49 ng/mL

## 2022-11-14 MED ORDER — SPIRONOLACTONE 25 MG PO TABS
25.0000 mg | ORAL_TABLET | Freq: Every day | ORAL | Status: DC
  Filled 2022-11-14: qty 1

## 2022-11-14 MED ORDER — ATORVASTATIN CALCIUM 40 MG PO TABS
80.0000 mg | ORAL_TABLET | Freq: Every day | ORAL | Status: DC
  Filled 2022-11-14: qty 2

## 2022-11-14 MED ORDER — SODIUM CHLORIDE 0.9 % IV SOLN
2.0000 g | Freq: Once | INTRAVENOUS | Status: AC
  Administered 2022-11-14: 2 g via INTRAVENOUS
  Filled 2022-11-14: qty 12.5

## 2022-11-14 MED ORDER — VANCOMYCIN HCL IN DEXTROSE 1-5 GM/200ML-% IV SOLN
1000.0000 mg | Freq: Once | INTRAVENOUS | Status: AC
  Administered 2022-11-14: 1000 mg via INTRAVENOUS
  Filled 2022-11-14: qty 200

## 2022-11-14 MED ORDER — IPRATROPIUM-ALBUTEROL 0.5-2.5 (3) MG/3ML IN SOLN
RESPIRATORY_TRACT | Status: AC
  Administered 2022-11-14: 3 mL via RESPIRATORY_TRACT
  Filled 2022-11-14: qty 3

## 2022-11-14 MED ORDER — DAPAGLIFLOZIN PROPANEDIOL 10 MG PO TABS
10.0000 mg | ORAL_TABLET | Freq: Every day | ORAL | Status: DC
  Administered 2022-11-14 – 2022-11-24 (×10): 10 mg via ORAL
  Filled 2022-11-14 (×10): qty 1

## 2022-11-14 MED ORDER — FUROSEMIDE 10 MG/ML IJ SOLN
80.0000 mg | Freq: Two times a day (BID) | INTRAMUSCULAR | Status: DC
  Administered 2022-11-14 – 2022-11-15 (×2): 80 mg via INTRAVENOUS
  Filled 2022-11-14 (×2): qty 8

## 2022-11-14 MED ORDER — SODIUM CHLORIDE 0.9% FLUSH
3.0000 mL | Freq: Two times a day (BID) | INTRAVENOUS | Status: DC
  Administered 2022-11-14 – 2022-11-24 (×19): 3 mL via INTRAVENOUS

## 2022-11-14 MED ORDER — SODIUM CHLORIDE 0.9 % IV SOLN: 2.0000 g | Freq: Two times a day (BID) | INTRAVENOUS | Status: DC

## 2022-11-14 MED ORDER — ACETAMINOPHEN 650 MG RE SUPP: 650.0000 mg | Freq: Four times a day (QID) | RECTAL | Status: DC | PRN

## 2022-11-14 MED ORDER — SODIUM CHLORIDE 0.9 % IV BOLUS
500.0000 mL | Freq: Once | INTRAVENOUS | Status: AC
  Administered 2022-11-14: 500 mL via INTRAVENOUS

## 2022-11-14 MED ORDER — DIGOXIN 125 MCG PO TABS
0.1250 mg | ORAL_TABLET | Freq: Every day | ORAL | Status: DC
  Filled 2022-11-14: qty 1

## 2022-11-14 MED ORDER — OSELTAMIVIR PHOSPHATE 75 MG PO CAPS
75.0000 mg | ORAL_CAPSULE | Freq: Once | ORAL | Status: AC
  Administered 2022-11-14: 75 mg via ORAL

## 2022-11-14 MED ORDER — ONDANSETRON HCL 4 MG/2ML IJ SOLN
INTRAMUSCULAR | Status: AC
  Administered 2022-11-14: 4 mg via INTRAVENOUS
  Filled 2022-11-14: qty 2

## 2022-11-14 MED ORDER — SPIRONOLACTONE 25 MG PO TABS
25.0000 mg | ORAL_TABLET | Freq: Every day | ORAL | Status: DC
  Administered 2022-11-14 – 2022-11-24 (×10): 25 mg via ORAL
  Filled 2022-11-14 (×10): qty 1

## 2022-11-14 MED ORDER — SODIUM CHLORIDE 0.9 % IV SOLN
2.0000 g | INTRAVENOUS | Status: AC
  Administered 2022-11-14 – 2022-11-18 (×5): 2 g via INTRAVENOUS
  Filled 2022-11-14 (×5): qty 20

## 2022-11-14 MED ORDER — SODIUM CHLORIDE 0.9 % IV SOLN
100.0000 mg | Freq: Two times a day (BID) | INTRAVENOUS | Status: AC
  Administered 2022-11-14 – 2022-11-19 (×9): 100 mg via INTRAVENOUS
  Filled 2022-11-14 (×10): qty 100

## 2022-11-14 MED ORDER — METHYLPREDNISOLONE SODIUM SUCC 125 MG IJ SOLR
125.0000 mg | Freq: Once | INTRAMUSCULAR | Status: AC
  Administered 2022-11-14: 125 mg via INTRAVENOUS
  Filled 2022-11-14: qty 2

## 2022-11-14 MED ORDER — NAPHAZOLINE-PHENIRAMINE 0.025-0.3 % OP SOLN: 1.0000 [drp] | Freq: Four times a day (QID) | OPHTHALMIC | Status: DC | PRN

## 2022-11-14 MED ORDER — DIGOXIN 125 MCG PO TABS
0.1250 mg | ORAL_TABLET | Freq: Every day | ORAL | Status: DC
  Administered 2022-11-14 – 2022-11-17 (×3): 0.125 mg via ORAL
  Filled 2022-11-14 (×2): qty 1

## 2022-11-14 MED ORDER — APIXABAN 2.5 MG PO TABS
2.5000 mg | ORAL_TABLET | Freq: Two times a day (BID) | ORAL | Status: DC
  Filled 2022-11-14: qty 1

## 2022-11-14 MED ORDER — OSELTAMIVIR PHOSPHATE 75 MG PO CAPS
75.0000 mg | ORAL_CAPSULE | Freq: Once | ORAL | Status: DC
  Filled 2022-11-14: qty 1

## 2022-11-14 MED ORDER — OSELTAMIVIR PHOSPHATE 75 MG PO CAPS: 75.0000 mg | ORAL_CAPSULE | Freq: Two times a day (BID) | ORAL | Status: DC

## 2022-11-14 MED ORDER — VANCOMYCIN HCL IN DEXTROSE 1-5 GM/200ML-% IV SOLN: 1000.0000 mg | INTRAVENOUS | Status: DC

## 2022-11-14 MED ORDER — ATORVASTATIN CALCIUM 80 MG PO TABS
80.0000 mg | ORAL_TABLET | Freq: Every day | ORAL | Status: DC
  Administered 2022-11-14 – 2022-11-24 (×10): 80 mg via ORAL
  Filled 2022-11-14 (×9): qty 1

## 2022-11-14 MED ORDER — GUAIFENESIN ER 600 MG PO TB12
600.0000 mg | ORAL_TABLET | Freq: Two times a day (BID) | ORAL | Status: DC
  Administered 2022-11-14 – 2022-11-24 (×20): 600 mg via ORAL
  Filled 2022-11-14 (×20): qty 1

## 2022-11-14 MED ORDER — BENZONATATE 100 MG PO CAPS
200.0000 mg | ORAL_CAPSULE | Freq: Two times a day (BID) | ORAL | Status: DC | PRN
  Administered 2022-11-14 – 2022-11-18 (×3): 200 mg via ORAL
  Filled 2022-11-14 (×6): qty 2

## 2022-11-14 MED ORDER — IPRATROPIUM-ALBUTEROL 0.5-2.5 (3) MG/3ML IN SOLN
3.0000 mL | Freq: Four times a day (QID) | RESPIRATORY_TRACT | Status: DC
  Administered 2022-11-14 – 2022-11-16 (×8): 3 mL via RESPIRATORY_TRACT
  Filled 2022-11-14 (×9): qty 3

## 2022-11-14 MED ORDER — IVABRADINE HCL 7.5 MG PO TABS
7.5000 mg | ORAL_TABLET | Freq: Two times a day (BID) | ORAL | Status: DC
  Administered 2022-11-14 – 2022-11-24 (×18): 7.5 mg via ORAL
  Filled 2022-11-14 (×21): qty 1

## 2022-11-14 MED ORDER — IPRATROPIUM-ALBUTEROL 0.5-2.5 (3) MG/3ML IN SOLN: 3.0000 mL | Freq: Once | RESPIRATORY_TRACT | Status: AC

## 2022-11-14 MED ORDER — OSELTAMIVIR PHOSPHATE 30 MG PO CAPS
30.0000 mg | ORAL_CAPSULE | Freq: Two times a day (BID) | ORAL | Status: DC
  Administered 2022-11-14: 30 mg via ORAL
  Filled 2022-11-14 (×2): qty 1

## 2022-11-14 MED ORDER — APIXABAN 2.5 MG PO TABS
2.5000 mg | ORAL_TABLET | Freq: Two times a day (BID) | ORAL | Status: DC
  Administered 2022-11-14 – 2022-11-19 (×10): 2.5 mg via ORAL
  Filled 2022-11-14 (×11): qty 1

## 2022-11-14 MED ORDER — FUROSEMIDE 10 MG/ML IJ SOLN: 80.0000 mg | Freq: Two times a day (BID) | INTRAMUSCULAR | Status: DC

## 2022-11-14 MED ORDER — DAPAGLIFLOZIN PROPANEDIOL 10 MG PO TABS
10.0000 mg | ORAL_TABLET | Freq: Every day | ORAL | Status: DC
  Filled 2022-11-14: qty 1

## 2022-11-14 MED ORDER — ALBUTEROL SULFATE (2.5 MG/3ML) 0.083% IN NEBU
2.5000 mg | INHALATION_SOLUTION | RESPIRATORY_TRACT | Status: DC | PRN
  Administered 2022-11-15 – 2022-11-22 (×3): 2.5 mg via RESPIRATORY_TRACT
  Filled 2022-11-14 (×3): qty 3

## 2022-11-14 MED ORDER — GUAIFENESIN ER 600 MG PO TB12: 600.0000 mg | ORAL_TABLET | Freq: Two times a day (BID) | ORAL | Status: DC

## 2022-11-14 MED ORDER — ACETAMINOPHEN 325 MG PO TABS
650.0000 mg | ORAL_TABLET | Freq: Four times a day (QID) | ORAL | Status: DC | PRN
  Administered 2022-11-14 – 2022-11-21 (×7): 650 mg via ORAL
  Filled 2022-11-14 (×7): qty 2

## 2022-11-14 MED ORDER — ONDANSETRON HCL 4 MG/2ML IJ SOLN: 4.0000 mg | Freq: Once | INTRAMUSCULAR | Status: AC

## 2022-11-14 NOTE — ED Triage Notes (Signed)
PT BIB [facility] for c/o shortness of breath x3 days ago. PT reports chills, productive cough, nausea.

## 2022-11-14 NOTE — Progress Notes (Signed)
Pt taken off BIPAP and placed on 2l [place]. Pt is tolerating well at this time. RT will monitor.

## 2022-11-14 NOTE — H&P (Addendum)
History and Physical    Patient: Brittany [name] GYB:[id number] DOB: 12/21/1960 DOA: 11/14/2022 DOS: the patient was seen and examined on 11/14/2022 PCP: Traci Sermon, PA-C  Patient coming from: Home via EMS  Chief Complaint:  Chief Complaint  Patient presents with   Shortness of Breath   HPI: Brittany [name] is a 61 y.o. female with medical history significant of PAF, hypertension, previous small cell lung cancer treated with chemo/ chest XRT/ prophylactic brain radiation in 2015, CAD s/p CABG, chronic systolic HF EF ~87%, CKD stage IIIa, prior history of tobacco abuse who presents with complaints of progressively worsening shortness of breath over the last 3 to 4 days.  She reports having fevers, productive cough, generalized malaise, nausea, vomiting, and myalgias.  She did not get her flu vaccine this year.  Patient also reports that her weight seems to be up approximately 3 pounds.  She had last been seen by Dr. [name] on 11/29 for which she was noted to be wheezing at that time and had been prescribed a course of steroids taper.  At home patient has oxygen as needed, but over the last 2 days she has been using it 24/7.  In the emergency department patient was seen to be afebrile with heart rate 100-121, respirations 22-31,   blood pressures transiently low at 87/47, and O2 saturations currently maintained on BiPAP.  Labs significant for WBC 13.3, glucose 184, BUN 22, creatinine 1.28, BNP 3393.9, high-sensitivity troponin 152, and lactic acid 2.7.  Influenza screening was positive.  Chest x-ray significant for interval increase in right base atelectasis or infiltrate with stable scarring or tiny effusion at the right lung base.  Review of Systems: As mentioned in the history of present illness. All other systems reviewed and are negative. Past Medical History:  Diagnosis Date   Acute respiratory failure ([place]) 68/10/5725   Acute systolic congestive heart failure  ([place]) 02/03/2018   AICD (automatic cardioverter/defibrillator) present    Allergy    Anxiety    Asthma    DM2 (diabetes mellitus, type 2) ([place]) 10/20/2020   Hypertension    PAF (paroxysmal atrial fibrillation) ([place])    Presence of permanent cardiac pacemaker    Prophylactic measure 08/03/14-08/19/14   Prophyl. 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   LIMA to LAD, SVG to D1, SVG to OM1, EVH via right thigh with implantation of Impella LD LVAD via direct aortic approach   Small cell lung cancer ([place]) 03/16/2014   Past Surgical History:  Procedure Laterality Date   BRONCHIAL BRUSHINGS  10/25/2020   Procedure: BRONCHIAL BRUSHINGS;  Surgeon: Collene Gobble, MD;  Location: [facility] ENDOSCOPY;  Service: Pulmonary;;   BRONCHIAL NEEDLE ASPIRATION BIOPSY  10/25/2020   Procedure: BRONCHIAL NEEDLE ASPIRATION BIOPSIES;  Surgeon: Collene Gobble, MD;  Location: [facility] ENDOSCOPY;  Service: Pulmonary;;   CARDIAC DEFIBRILLATOR PLACEMENT  08/15/2018   MDT Visia AF MRI VR ICD implanted by Dr Loel Lofty for primary prevention of sudden   CESAREAN SECTION     CORONARY ARTERY BYPASS GRAFT N/A 02/03/2018   Procedure: CORONARY ARTERY BYPASS GRAFTING (CABG);  Surgeon: Rexene Alberts, MD;  Location: [place];  Service: Open Heart Surgery;  Laterality: N/A;  Time 3 using left internal mammary artery and endoscopically harvested right saphenous vein   CORONARY BALLOON ANGIOPLASTY N/A 02/03/2018   Procedure: CORONARY BALLOON ANGIOPLASTY;  Surgeon: Martinique, Peter M, MD;  Location: [facility] CV LAB;  Service: Cardiovascular;  Laterality: N/A;  CORONARY STENT INTERVENTION N/A 02/12/2019   Procedure: CORONARY STENT INTERVENTION;  Surgeon: Wellington Hampshire, MD;  Location: [place] CV LAB;  Service: Cardiovascular;  Laterality: N/A;   CORONARY/GRAFT ACUTE MI REVASCULARIZATION N/A 02/03/2018   Procedure: Coronary/Graft Acute MI Revascularization;  Surgeon: Martinique, Peter M, MD;  Location: [place] CV LAB;   Service: Cardiovascular;  Laterality: N/A;   ENDOBRONCHIAL ULTRASOUND N/A 10/25/2020   Procedure: ENDOBRONCHIAL ULTRASOUND;  Surgeon: Collene Gobble, MD;  Location: [facility] ENDOSCOPY;  Service: Pulmonary;  Laterality: N/A;   FLEXIBLE BRONCHOSCOPY  10/25/2020   Procedure: FLEXIBLE BRONCHOSCOPY;  Surgeon: Collene Gobble, MD;  Location: [facility] ENDOSCOPY;  Service: Pulmonary;;   IABP INSERTION N/A 02/03/2018   Procedure: IABP Insertion;  Surgeon: Martinique, Peter M, MD;  Location: [place] CV LAB;  Service: Cardiovascular;  Laterality: N/A;   INTRAOPERATIVE TRANSESOPHAGEAL ECHOCARDIOGRAM N/A 02/03/2018   Procedure: INTRAOPERATIVE TRANSESOPHAGEAL ECHOCARDIOGRAM;  Surgeon: Rexene Alberts, MD;  Location: [place];  Service: Open Heart Surgery;  Laterality: N/A;   LEFT HEART CATH AND CORONARY ANGIOGRAPHY N/A 02/03/2018   Procedure: LEFT HEART CATH AND CORONARY ANGIOGRAPHY;  Surgeon: Martinique, Peter M, MD;  Location: [place] CV LAB;  Service: Cardiovascular;  Laterality: N/A;   LEFT HEART CATH AND CORS/GRAFTS ANGIOGRAPHY N/A 02/12/2019   Procedure: LEFT HEART CATH AND CORS/GRAFTS ANGIOGRAPHY;  Surgeon: Wellington Hampshire, MD;  Location: [place] CV LAB;  Service: Cardiovascular;  Laterality: N/A;   MEDIASTINOSCOPY N/A 03/11/2014   Procedure: MEDIASTINOSCOPY;  Surgeon: Melrose Nakayama, MD;  Location: [place];  Service: Thoracic;  Laterality: N/A;   PLACEMENT OF IMPELLA LEFT VENTRICULAR ASSIST DEVICE  02/03/2018   Procedure: PLACEMENT OF [place] LEFT VENTRICULAR ASSIST DEVICE LD;  Surgeon: Rexene Alberts, MD;  Location: [place];  Service: Open Heart Surgery;;   REMOVAL OF [place] LEFT VENTRICULAR ASSIST DEVICE N/A 02/08/2018   Procedure: REMOVAL OF [place] LEFT VENTRICULAR ASSIST DEVICE;  Surgeon: Rexene Alberts, MD;  Location: [place];  Service: Open Heart Surgery;  Laterality: N/A;   RIGHT HEART CATH N/A 02/03/2018   Procedure: RIGHT HEART CATH;  Surgeon: Martinique, Peter M, MD;  Location: [place] CV LAB;  Service:  Cardiovascular;  Laterality: N/A;   RIGHT HEART CATH N/A 05/09/2018   Procedure: RIGHT HEART CATH;  Surgeon: Jolaine Artist, MD;  Location: [place] CV LAB;  Service: Cardiovascular;  Laterality: N/A;   TEE WITHOUT CARDIOVERSION N/A 02/08/2018   Procedure: TRANSESOPHAGEAL ECHOCARDIOGRAM (TEE);  Surgeon: Rexene Alberts, MD;  Location: [place];  Service: Open Heart Surgery;  Laterality: N/A;   TUBAL LIGATION     VIDEO BRONCHOSCOPY WITH ENDOBRONCHIAL ULTRASOUND N/A 03/11/2014   Procedure: VIDEO BRONCHOSCOPY WITH ENDOBRONCHIAL ULTRASOUND;  Surgeon: Melrose Nakayama, MD;  Location: [place];  Service: Thoracic;  Laterality: N/A;   Social History:  reports that she quit smoking about 8 years ago. Her smoking use included cigarettes. She has a 20.00 pack-year smoking history. She has never used smokeless tobacco. She reports current alcohol use of about 8.0 standard drinks of alcohol per week. She reports current drug use. Drug: Marijuana.  Allergies  Allergen Reactions   Codeine Nausea And Vomiting    Family History  Problem Relation Age of Onset   Heart disease Mother    Hypertension Mother    Heart attack Mother    Hypertension Maternal Grandmother    Cancer Maternal Grandmother    Diabetes Paternal Grandmother    Stroke Neg Hx  Prior to Admission medications   Medication Sig Start Date End Date Taking? Authorizing Provider  albuterol (VENTOLIN HFA) 108 (90 Base) MCG/ACT inhaler Inhale 2 puffs into the lungs every 6 (six) hours as needed for wheezing or shortness of breath. 06/20/22   Domenic Polite, MD  apixaban (ELIQUIS) 2.5 MG TABS tablet Take 1 tablet (2.5 mg total) by mouth 2 (two) times daily. 08/09/22   [name], Maricela Bo, FNP  atorvastatin (LIPITOR) 80 MG tablet Take 1 tablet (80 mg total) by mouth daily at 6 PM. 04/16/22   Hongalgi, Lenis Dickinson, MD  blood glucose meter kit and supplies KIT Dispense based on patient and insurance preference. Use up to four times daily as directed.  04/11/21   Bonnielee Haff, MD  dapagliflozin propanediol (FARXIGA) 10 MG TABS tablet Take 1 tablet by mouth daily. 09/04/22   Bensimhon, Shaune Pascal, MD  digoxin (LANOXIN) 0.125 MG tablet Take 1 tablet (0.125 mg total) by mouth daily. 07/13/22   Rosita Fire, Brittainy M, PA-C  Glucerna (GLUCERNA) LIQD Take 237 mLs by mouth See admin instructions. Takes 1 bottle twice a week    [provider]  ipratropium-albuterol (DUONEB) 0.5-2.5 (3) MG/3ML SOLN Take 3 mLs by nebulization every 4 (four) hours as needed (for shortness of breath). 07/12/22   Larey Dresser, MD  ivabradine (CORLANOR) 7.5 MG TABS tablet Take 1 tablet (7.5 mg total) by mouth 2 (two) times daily with a meal. TAKE 1 TABLET (7.5 MG TOTAL) BY MOUTH 2 (TWO) TIMES DAILY WITH A MEAL. 10/10/22   Bensimhon, Shaune Pascal, MD  metolazone (ZAROXOLYN) 2.5 MG tablet TAKE 1 TABLET BY MOUTH AS DIRECTED. TAKE TODAY AND TOMORROW AND THEN HOLD TILL DIRECTED 10/06/22   Bensimhon, Shaune Pascal, MD  Multiple Vitamin (MULTIVITAMIN WITH MINERALS) TABS tablet Take 1 tablet by mouth daily. 12/15/21   Hongalgi, Lenis Dickinson, MD  naphazoline-pheniramine (VISINE) 0.025-0.3 % ophthalmic solution Place 1 drop into both eyes 4 (four) times daily as needed for eye irritation.    [provider]  nitroGLYCERIN (NITROSTAT) 0.4 MG SL tablet Place 1 tablet (0.4 mg total) under the tongue every 5 (five) minutes as needed for chest pain. 02/14/19   Shirley Friar, PA-C  potassium chloride SA (KLOR-CON M) 20 MEQ tablet Take 1 tablet (20 mEq total) by mouth daily. TAke extra 36mq when you take higher dose of torsemide or metolazone 07/26/22   [name], JMaricela Bo FNP  predniSONE (DELTASONE) 10 MG tablet Take 4 tablets (40 mg total) by mouth daily with breakfast for 2 days, THEN 3 tablets (30 mg total) daily with breakfast for 2 days, THEN 2 tablets (20 mg total) daily with breakfast for 2 days, THEN 1 tablet (10 mg total) daily with breakfast for 2 days. 11/08/22 11/16/22  Bensimhon,  DShaune Pascal MD  spironolactone (ALDACTONE) 25 MG tablet Take 1 tablet (25 mg total) by mouth daily. 05/19/22   [name], JMaricela Bo FNP  torsemide (DEMADEX) 20 MG tablet Take 3 tablets (60 mg total) by mouth daily. 07/13/22   SConsuelo Pandy PA-C    Physical Exam: Vitals:   11/14/22 0830 11/14/22 0845 11/14/22 0915 11/14/22 0930  BP: 99/63 (!) 95/55 96/64 (!) 87/56  Pulse: (!) 111 (!) 105 (!) 101 100  Resp: (!) 26 (!) 25 (!) 24 (!) 22  Temp:      TempSrc:      SpO2: 99% 100% 100% 100%  Weight:      Height:       Exam  Constitutional: Moderately built female who appears to be acutely ill Eyes: PERRL, lids and conjunctivae normal ENMT: Mucous membranes are dry. Neck: normal, supple, no significant JVD appreciated. respiratory: Tachypneic with expiratory wheezes appreciated in both lung fields. Cardiovascular: Tachycardic with positive murmur present. No lower extremity edema Abdomen: no tenderness, no masses palpated. No hepatosplenomegaly. Bowel sounds positive.  Musculoskeletal: no clubbing / cyanosis. No joint deformity upper and lower extremities. Good ROM, no contractures. Normal muscle tone.  Skin: no rashes, lesions, ulcers. No induration Neurologic: CN 2-12 grossly intact. Sensation intact, DTR normal. Strength 5/5 in all 4.  Psychiatric: Normal judgment and insight. Alert and oriented x 3. Normal mood.   Data Reviewed:  EKG of poor quality due to background artifact, but appears to show sinus tachycardia 121 bpm.  Reviewed labs, imaging, and pertinent record as noted above in HPI.  Assessment and Plan:  Sepsis secondary to influenza A with pneumonia Acute.  Patient reports for the last 3 to 4 days she has had cough, wheezing, and subjective fevers at home.  Met SIRS criteria with tachycardia, tachypnea, and leukocytosis(13.3).  Patient was put on BiPAP due to respiratory distress but was not documented to be hypoxic.  Chest x-ray gave concern for the possibility of  pneumonia.  Influenza screening was positive for influenza A.  Lactic acid was initially elevated 2.7.  Blood cultures were obtained.  Patient had been started empirically on cefepime and vancomycin. -Admit to a progressive bed -Continuous pulse oximetry with nasal cannula oxygen to maintain O2 saturations -BiPAP as needed -Follow-up blood cultures -Check procalcitonin -Tamiflu -De-escalate antibiotics to Rocephin and doxycycline -Recheck CBC tomorrow morning.  Acute on chronic respiratory failure with hypoxia COPD with exacerbation Patient initially noted to be in respiratory distress with wheezing noted on physical exam.  Placed on BiPAP temporarily due to work of breathing.  Patient has been given Solu-Medrol 125 mg IV and breathing treatments.  She was able to be weaned off BiPAP down to 2 L of oxygen which she has as needed at home. -Continuous pulse oximetry with oxygen maintain O2 saturations greater than 90%. -DuoNebs 4 times daily and albuterol nebs as needed -Prednisone   Elevated troponin CAD Acute.  High-sensitivity troponin 152-> 294.  Patient reports centralized chest pressure.  Prior history of NSTEMI/STEMI status post emergent CABG in 01/2018.  Suspect possibly secondary to demand in the setting of respiratory -Continue statin -Cardiology consulted to evaluate, we will follow-up for any further recommendations  Transient hypotension Blood pressures had been noted to be initially as low as 85/47.  Patient has been given a bolus of IV fluids in the ED.  Blood pressures currently maintained after being taken off of BiPAP. -Continue to monitor blood pressure  Heart failure with reduced EF Chronic.  Patient reports weight gain of approximately 3 pounds.  Last echocardiogram noted EF to be 20-25%.  BNP significantly elevated at 3393.9. -Strict I&O's and daily weights  PAF on chronic anticoagulation CHA2DS2-VASc score equal to at least 4.  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 -Continue Eliquis  CKD stage IIIa Stable  History of squamous cell lung cancer Patient with prior treatment chemotherapy, chest radiation, and prophylactic brain radiation in 2015.  DVT prophylaxis: Eliquis Advance Care Planning:   Code Status: Full Code   Consults: Cardiology  Family Communication: None requested by the patient  Severity of Illness: The appropriate patient status for this patient  is INPATIENT. Inpatient status is judged to be reasonable and necessary in order to provide the required intensity of service to ensure the patient's safety. The patient's presenting symptoms, physical exam findings, and initial radiographic and laboratory data in the context of their chronic comorbidities is felt to place them at high risk for further clinical deterioration. Furthermore, it is not anticipated that the patient will be medically stable for discharge from the hospital within 2 midnights of admission.   * I certify that at the point of admission it is my clinical judgment that the patient will require inpatient hospital care spanning beyond 2 midnights from the point of admission due to high intensity of service, high risk for further deterioration and high frequency of surveillance required.*    Author: Norval Morton, MD 11/14/2022 10:38 AM  For on call review [email or web address].

## 2022-11-14 NOTE — ED Notes (Signed)
Pt c/o increased shortness of breath. Pt 02 saturation in the high 90's on 2l. Pt breathing labored. Pt BIPAP placed back on pt. Dr. Tamala Julian aware.

## 2022-11-14 NOTE — ED Notes (Signed)
Patient denies pain and is resting comfortably. Eating meal at bedside.

## 2022-11-14 NOTE — ED Notes (Signed)
Patient placed back on BIPAP due to increased work of breathing and patient becoming more diaphoretic. RT called for assistance set up breathing treatment.

## 2022-11-14 NOTE — Consult Note (Addendum)
Advanced Heart Failure Team Consult Note   Primary Physician: Traci Sermon, PA-C PCP-Cardiologist:  None  Reason for Consultation: Acute on chronic systolic heart failure  HPI:    Brittany [name] is seen today for evaluation of acute on chronic systolic heart failure at the request of Dr. Tamala Julian, emergency medicine.   Brittany [name] is a 61 y.o. 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 Underwent emergent cath 02/03/18 showed LAD 100% stenosed, LCx 95% stenosed. Taken for emergent CABG 02/03/18. Required impella post op. Hospital course complicated by cardiogenic shock, HCAP, A fib, respiratory failure, and swallowing issues. She was discharged to SNF. Discharge weight 103 pounds.    In 2019 had multiple hospitalizations for HF and pleural effusion. Underwent pleurodesis at [facility].   Admitted 3/20 with NSTEMI and HF. Received DES to ostial ramus into distal left main.   Admitted 11/21 and 3/22 with COPD flare with mild HF and hemoptysis.    Admitted 12/22 with R renal infarct felt to be cardio-embolic (no AF found). Started on Eliquis  Barostim placed 04/13/22.   Admitted 7/23 w/ a/c respiratory failure and CHF. Placed on milrinone and diuresed. Given prednisone taper and doxy for COPD exacerbation. GDMT adjusted, discharged home on higher torsemide dose of 60 mg daily, weight 99 lbs.   Brittany [name] was recently seen in our clinic and was noted to have wheezing, started on a prednisone taper then. Over the last 3-4 days she has experienced increased wheezing, SOB and chest pain. Pt called EMS, received DuoNeb en route. Has been compliant with all medications. Limits herself to 30oz fluid/day. Appetite decreased. Has not taken any extra metolazone  or torsemide. Weight up 10lbs since July.  In the ED tachycardic, BP stable but on the softer side, afebrile. WBC 13.3, BNP 3393.9, lactic acid 2.7, Cr 1.28 (baseline 1.1-1.2), HsTrop 152>>294, influenza +, blood cultures pending, lactic acid 2.7>1.7. CXR with R base atelectasis or infiltrate with stable scaring and tiny effusions at R base. Started on IV abx and placed on bipap.  On assessment, stable on 2L [place]. Productive cough. Volume overloaded. Chest pain 6/10, persistent for the last 3-4 days when cough started. Had IVF open to gravity at bedside, clamped off.   Cardidac Studies reviewed: - Echo (7/23): EF 20-25%, global hypokinesis, GIIDD, RV systolic function, mildly reduced. RV size normal. Mild MR, trivial TR, aortic valve w/ mild-mod regurg - Echo 1/23: EF 20-25%, severely reduced LV with global HK, grade II DD, normal RV - Echo 02/10/21: EF <20%, G2DD - Echo 2022: EF < 20% RV normal  - Echo 2021: EF 20-25% RV mildly reduced.  - Echo 02/12/19: EF 25-30%, RV mildly reduced - 05/18/2021 PFT's: moderately severe airway obstruction and a diffusion defect suggesting emphysema. - LHC 02/12/19: 1.  Significant underlying two-vessel coronary artery disease with patent SVG to OM and SVG to diagonal which supplies the LAD territory.  Atretic LIMA to LAD given competitive flow from SVG to diagonal.  Severe ostial ramus artery stenosis not supplied by bypass with his area suggestive of plaque rupture. 2.  Severely elevated left ventricular end-diastolic pressure 34 mmHg.  Left ventricular angiography was not performed. 3.  Successful angioplasty and drug-eluting stent placement to the ostial ramus extending into the distal left  main. [place] 2019  1. Minimally elevated left heart pressures 2. Mild PAH 3. Normal cardiac output  Home Medications Prior to Admission medications   Medication Sig Start Date End Date Taking? Authorizing Provider  albuterol (VENTOLIN HFA) 108 (90 Base) MCG/ACT inhaler Inhale 2  puffs into the lungs every 6 (six) hours as needed for wheezing or shortness of breath. 06/20/22   Domenic Polite, MD  apixaban (ELIQUIS) 2.5 MG TABS tablet Take 1 tablet (2.5 mg total) by mouth 2 (two) times daily. 08/09/22   [name], Maricela Bo, FNP  atorvastatin (LIPITOR) 80 MG tablet Take 1 tablet (80 mg total) by mouth daily at 6 PM. 04/16/22   Hongalgi, Lenis Dickinson, MD  blood glucose meter kit and supplies KIT Dispense based on patient and insurance preference. Use up to four times daily as directed. 04/11/21   Bonnielee Haff, MD  dapagliflozin propanediol (FARXIGA) 10 MG TABS tablet Take 1 tablet by mouth daily. 09/04/22   Bensimhon, Shaune Pascal, MD  digoxin (LANOXIN) 0.125 MG tablet Take 1 tablet (0.125 mg total) by mouth daily. 07/13/22   Rosita Fire, Brittainy M, PA-C  Glucerna (GLUCERNA) LIQD Take 237 mLs by mouth See admin instructions. Takes 1 bottle twice a week    [provider]  ipratropium-albuterol (DUONEB) 0.5-2.5 (3) MG/3ML SOLN Take 3 mLs by nebulization every 4 (four) hours as needed (for shortness of breath). 07/12/22   Larey Dresser, MD  ivabradine (CORLANOR) 7.5 MG TABS tablet Take 1 tablet (7.5 mg total) by mouth 2 (two) times daily with a meal. TAKE 1 TABLET (7.5 MG TOTAL) BY MOUTH 2 (TWO) TIMES DAILY WITH A MEAL. 10/10/22   Bensimhon, Shaune Pascal, MD  metolazone (ZAROXOLYN) 2.5 MG tablet TAKE 1 TABLET BY MOUTH AS DIRECTED. TAKE TODAY AND TOMORROW AND THEN HOLD TILL DIRECTED 10/06/22   Bensimhon, Shaune Pascal, MD  Multiple Vitamin (MULTIVITAMIN WITH MINERALS) TABS tablet Take 1 tablet by mouth daily. 12/15/21   Hongalgi, Lenis Dickinson, MD  naphazoline-pheniramine (VISINE) 0.025-0.3 % ophthalmic solution Place 1 drop into both eyes 4 (four) times daily as needed for eye irritation.    [provider]  nitroGLYCERIN (NITROSTAT) 0.4 MG SL tablet Place 1 tablet (0.4 mg total) under the tongue every 5 (five) minutes as needed for chest pain. 02/14/19   Shirley Friar, PA-C  potassium  chloride SA (KLOR-CON M) 20 MEQ tablet Take 1 tablet (20 mEq total) by mouth daily. TAke extra 64mq when you take higher dose of torsemide or metolazone 07/26/22   [name], JMaricela Bo FNP  predniSONE (DELTASONE) 10 MG tablet Take 4 tablets (40 mg total) by mouth daily with breakfast for 2 days, THEN 3 tablets (30 mg total) daily with breakfast for 2 days, THEN 2 tablets (20 mg total) daily with breakfast for 2 days, THEN 1 tablet (10 mg total) daily with breakfast for 2 days. 11/08/22 11/16/22  Bensimhon, DShaune Pascal MD  spironolactone (ALDACTONE) 25 MG tablet Take 1 tablet (25 mg total) by mouth daily. 05/19/22   [name], JMaricela Bo FNP  torsemide (DEMADEX) 20 MG tablet Take 3 tablets (60 mg total) by mouth daily. 07/13/22   SConsuelo Pandy PA-C    Past Medical History: Past Medical History:  Diagnosis Date   Acute respiratory failure ([facility] 086/76/7209  Acute systolic congestive heart failure ([facility] 02/03/2018   AICD (automatic cardioverter/defibrillator) present    Allergy    Anxiety    Asthma    DM2 (diabetes mellitus, type 2) ([facility] 10/20/2020  Hypertension    PAF (paroxysmal atrial fibrillation) (HCC)    Presence of permanent cardiac pacemaker    Prophylactic measure 08/03/14-08/19/14   Prophyl. 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   LIMA to LAD, SVG to D1, SVG to OM1, EVH via right thigh with implantation of Impella LD LVAD via direct aortic approach   Small cell lung cancer ([place]) 03/16/2014    Past Surgical History: Past Surgical History:  Procedure Laterality Date   BRONCHIAL BRUSHINGS  10/25/2020   Procedure: BRONCHIAL BRUSHINGS;  Surgeon: Collene Gobble, MD;  Location: [facility] ENDOSCOPY;  Service: Pulmonary;;   BRONCHIAL NEEDLE ASPIRATION BIOPSY  10/25/2020   Procedure: BRONCHIAL NEEDLE ASPIRATION BIOPSIES;  Surgeon: Collene Gobble, MD;  Location: [place] ENDOSCOPY;  Service: Pulmonary;;   CARDIAC DEFIBRILLATOR PLACEMENT  08/15/2018   MDT Visia AF MRI VR ICD  implanted by Dr Loel Lofty for primary prevention of sudden   CESAREAN SECTION     CORONARY ARTERY BYPASS GRAFT N/A 02/03/2018   Procedure: CORONARY ARTERY BYPASS GRAFTING (CABG);  Surgeon: Rexene Alberts, MD;  Location: [place];  Service: Open Heart Surgery;  Laterality: N/A;  Time 3 using left internal mammary artery and endoscopically harvested right saphenous vein   CORONARY BALLOON ANGIOPLASTY N/A 02/03/2018   Procedure: CORONARY BALLOON ANGIOPLASTY;  Surgeon: Martinique, Peter M, MD;  Location: [place] CV LAB;  Service: Cardiovascular;  Laterality: N/A;   CORONARY STENT INTERVENTION N/A 02/12/2019   Procedure: CORONARY STENT INTERVENTION;  Surgeon: Wellington Hampshire, MD;  Location: [place] CV LAB;  Service: Cardiovascular;  Laterality: N/A;   CORONARY/GRAFT ACUTE MI REVASCULARIZATION N/A 02/03/2018   Procedure: Coronary/Graft Acute MI Revascularization;  Surgeon: Martinique, Peter M, MD;  Location: [place] CV LAB;  Service: Cardiovascular;  Laterality: N/A;   ENDOBRONCHIAL ULTRASOUND N/A 10/25/2020   Procedure: ENDOBRONCHIAL ULTRASOUND;  Surgeon: Collene Gobble, MD;  Location: [facility] ENDOSCOPY;  Service: Pulmonary;  Laterality: N/A;   FLEXIBLE BRONCHOSCOPY  10/25/2020   Procedure: FLEXIBLE BRONCHOSCOPY;  Surgeon: Collene Gobble, MD;  Location: [facility] ENDOSCOPY;  Service: Pulmonary;;   IABP INSERTION N/A 02/03/2018   Procedure: IABP Insertion;  Surgeon: Martinique, Peter M, MD;  Location: [place] CV LAB;  Service: Cardiovascular;  Laterality: N/A;   INTRAOPERATIVE TRANSESOPHAGEAL ECHOCARDIOGRAM N/A 02/03/2018   Procedure: INTRAOPERATIVE TRANSESOPHAGEAL ECHOCARDIOGRAM;  Surgeon: Rexene Alberts, MD;  Location: [place];  Service: Open Heart Surgery;  Laterality: N/A;   LEFT HEART CATH AND CORONARY ANGIOGRAPHY N/A 02/03/2018   Procedure: LEFT HEART CATH AND CORONARY ANGIOGRAPHY;  Surgeon: Martinique, Peter M, MD;  Location: [place] CV LAB;  Service: Cardiovascular;  Laterality: N/A;   LEFT HEART CATH AND  CORS/GRAFTS ANGIOGRAPHY N/A 02/12/2019   Procedure: LEFT HEART CATH AND CORS/GRAFTS ANGIOGRAPHY;  Surgeon: Wellington Hampshire, MD;  Location: [place] CV LAB;  Service: Cardiovascular;  Laterality: N/A;   MEDIASTINOSCOPY N/A 03/11/2014   Procedure: MEDIASTINOSCOPY;  Surgeon: Melrose Nakayama, MD;  Location: [place];  Service: Thoracic;  Laterality: N/A;   PLACEMENT OF IMPELLA LEFT VENTRICULAR ASSIST DEVICE  02/03/2018   Procedure: PLACEMENT OF [place] LEFT VENTRICULAR ASSIST DEVICE LD;  Surgeon: Rexene Alberts, MD;  Location: [place];  Service: Open Heart Surgery;;   REMOVAL OF [place] LEFT VENTRICULAR ASSIST DEVICE N/A 02/08/2018   Procedure: REMOVAL OF [place] LEFT VENTRICULAR ASSIST DEVICE;  Surgeon: Rexene Alberts, MD;  Location: [place];  Service: Open Heart Surgery;  Laterality: N/A;   RIGHT  HEART CATH N/A 02/03/2018   Procedure: RIGHT HEART CATH;  Surgeon: Martinique, Peter M, MD;  Location: [place] CV LAB;  Service: Cardiovascular;  Laterality: N/A;   RIGHT HEART CATH N/A 05/09/2018   Procedure: RIGHT HEART CATH;  Surgeon: Jolaine Artist, MD;  Location: [place] CV LAB;  Service: Cardiovascular;  Laterality: N/A;   TEE WITHOUT CARDIOVERSION N/A 02/08/2018   Procedure: TRANSESOPHAGEAL ECHOCARDIOGRAM (TEE);  Surgeon: Rexene Alberts, MD;  Location: [place];  Service: Open Heart Surgery;  Laterality: N/A;   TUBAL LIGATION     VIDEO BRONCHOSCOPY WITH ENDOBRONCHIAL ULTRASOUND N/A 03/11/2014   Procedure: VIDEO BRONCHOSCOPY WITH ENDOBRONCHIAL ULTRASOUND;  Surgeon: Melrose Nakayama, MD;  Location: [place];  Service: Thoracic;  Laterality: N/A;    Family History: Family History  Problem Relation Age of Onset   Heart disease Mother    Hypertension Mother    Heart attack Mother    Hypertension Maternal Grandmother    Cancer Maternal Grandmother    Diabetes Paternal Grandmother    Stroke Neg Hx     Social History: Social History   Socioeconomic History   Marital status: Married     Spouse name: Michell Heinrich    Number of children: 1   Years of education: Not on file   Highest education level: Not on file  Occupational History   Not on file  Tobacco Use   Smoking status: Former    Packs/day: 1.00    Years: 20.00    Total pack years: 20.00    Types: Cigarettes    Quit date: 03/13/2014    Years since quitting: 8.6   Smokeless tobacco: Never  Vaping Use   Vaping Use: Never used  Substance and Sexual Activity   Alcohol use: Yes    Alcohol/week: 8.0 standard drinks of alcohol    Types: 8 Glasses of wine per week   Drug use: Yes    Types: Marijuana    Comment: "medicinal"   Sexual activity: Never  Other Topics Concern   Not on file  Social History Narrative   Not on file   Social Determinants of Health   Financial Resource Strain: Medium Risk (02/11/2021)   Overall Financial Resource Strain (CARDIA)    Difficulty of Paying Living Expenses: Somewhat hard  Food Insecurity: Food Insecurity Present (02/11/2021)   Hunger Vital Sign    Worried About Running Out of Food in the Last Year: Sometimes true    Ran Out of Food in the Last Year: Never true  Transportation Needs: Unmet Transportation Needs (03/23/2022)   PRAPARE - Hydrologist (Medical): Yes    Lack of Transportation (Non-Medical): No  Physical Activity: Insufficiently Active (02/11/2021)   Exercise Vital Sign    Days of Exercise per Week: 2 days    Minutes of Exercise per Session: 10 min  Stress: Stress Concern Present (02/11/2021)   [place]    Feeling of Stress : To some extent  Social Connections: Unknown (02/11/2021)   Social Connection and Isolation Panel [NHANES]    Frequency of Communication with Friends and Family: More than three times a week    Frequency of Social Gatherings with Friends and Family: Twice a week    Attends Religious Services: Not on Advertising copywriter or Organizations:  Not on file    Attends Archivist Meetings: Never    Marital Status: Married  Allergies:  Allergies  Allergen Reactions   Codeine Nausea And Vomiting    Objective:    Vital Signs:   Temp:  [97.7 F (36.5 C)] 97.7 F (36.5 C) (12/05 0741) Pulse Rate:  [100-121] 100 (12/05 0930) Resp:  [22-31] 22 (12/05 0930) BP: (87-119)/(55-69) 87/56 (12/05 0930) SpO2:  [91 %-100 %] 100 % (12/05 0930) FiO2 (%):  [40 %] 40 % (12/05 0830) Weight:  [49.5 kg] 49.5 kg (12/05 0742)    Weight change: Filed Weights   11/14/22 0742  Weight: 49.5 kg    Intake/Output:   Intake/Output Summary (Last 24 hours) at 11/14/2022 1051 Last data filed at 11/14/2022 1024 Gross per 24 hour  Intake 96.8 ml  Output --  Net 96.8 ml      Physical Exam    General:  chronically ill appearing.  HEENT: normal Neck: supple. JVP to ear. Carotids 2+ bilat; no bruits. No lymphadenopathy or thyromegaly appreciated. Cor: PMI nondisplaced. Regular rate & rhythm. No rubs, gallops or murmurs. Lungs: diminished, expiratory wheezes Abdomen: soft, nontender, nondistended. No hepatosplenomegaly. No bruits or masses. Good bowel sounds. Extremities: no cyanosis, clubbing, rash, edema Neuro: alert & orientedx3, cranial nerves grossly intact. moves all 4 extremities w/o difficulty. Affect pleasant  Telemetry   NSR 90s 0-1PVCs/hr (Personally reviewed) ;   EKG  ST 121 bpm Consider right atrial enlargement Nonspecific IVCD with LAD Left ventricular hypertrophy Artifact in lead(s) I II III aVR (2/2 barostim) Labs   Basic Metabolic Panel: Recent Labs  Lab 11/08/22 1428 11/14/22 0757 11/14/22 0835  NA 139 140 140  K 3.2* 3.7 3.7  CL 106 104  --   CO2 24 23  --   GLUCOSE 129* 184*  --   BUN 26* 22  --   CREATININE 1.19* 1.28*  --   CALCIUM 9.6 9.5  --     Liver Function Tests: No results for input(s): "AST", "ALT", "ALKPHOS", "BILITOT", "PROT", "ALBUMIN" in the last 168 hours. No results for  input(s): "LIPASE", "AMYLASE" in the last 168 hours. No results for input(s): "AMMONIA" in the last 168 hours.  CBC: Recent Labs  Lab 11/08/22 1428 11/14/22 0757 11/14/22 0835  WBC 6.5 13.3*  --   NEUTROABS  --  12.0*  --   HGB 13.8 13.3 14.6  HCT 44.4 40.9 43.0  MCV 94.1 93.2  --   PLT 188 172  --     Cardiac Enzymes: No results for input(s): "CKTOTAL", "CKMB", "CKMBINDEX", "TROPONINI" in the last 168 hours.  BNP: BNP (last 3 results) Recent Labs    07/09/22 0800 11/08/22 1428 11/14/22 0757  BNP 2,223.4* 1,395.6* 3,393.9*    ProBNP (last 3 results) No results for input(s): "PROBNP" in the last 8760 hours.   CBG: No results for input(s): "GLUCAP" in the last 168 hours.  Coagulation Studies: No results for input(s): "LABPROT", "INR" in the last 72 hours.   Imaging   DG Chest Port 1 View  Result Date: 11/14/2022 CLINICAL DATA:  Shortness of breath. EXAM: PORTABLE CHEST 1 VIEW COMPARISON:  07/09/2022 FINDINGS: 0739 hours. The cardio pericardial silhouette is enlarged. Interstitial markings are diffusely coarsened with chronic features. Interval increase in right base atelectasis or infiltrate with stable scarring or tiny effusion at the right base. Left AICD again noted. Battery pack for right cervical stimulator device evident. Telemetry leads overlie the chest. IMPRESSION: Interval increase in right base atelectasis or infiltrate with stable scarring or tiny effusion at the right base. Electronically Signed  By: Misty Stanley M.D.   On: 11/14/2022 07:57     Medications:     Current Medications:   Infusions:  ceFEPime (MAXIPIME) IV     vancomycin 1,000 mg (11/14/22 1031)   [START ON 11/16/2022] vancomycin        Patient Profile   Brittany [name] is a 61 y.o.Mar[place] Kitchen female with a history of PAF,  previous smoker quit 2015  years ago, hypertension, previous small cell lung cancer treated with chemo, chest XRT and prophylactic brain radiation in 2015, CAD  s/p CABG and chronic systolic HF EF ~39%. Presented to the ED with worsening SOB, found to be influenza +. AHF team asked to see for acute on chronic systolic heart failure.  Assessment/Plan  COPD w/ exacerbation, +influenza - tested + for influenza, WBC 13.3, now on abx and tamiflu - Now off bipap, sats in high 90s on 2L [place] - recently on short prednisone taper, didn't complete  2. Acute on chronic Systolic Heart Failure Due to ICM  - Has [employer] single chamber ICD  - Echo 07/10/22: EF 20-25%, global hypokinesis, GIIDD, RV systolic function, mildly reduced. RV size normal. Mild MR, trivial TR, aortic valve w/ mild-mod regurg.  - NYHA class IV, BNP >3000 - Volume overloaded, suspect 2/2 influenza w/ COPD exacerbation, will start 80 IV lasix BID - Continue digoxin 0.153m daily - Continue Farxiga 10 mg daily.  - Continue spiro 25 mg daily. - Continue Corlanor 7.5 mg bid - Failed Entresto due to hyperkalemia.   - No b-blocker due to intolerance with low output and bad COPD with frequent flares - Not a candidate for advanced therapies with her degree of lung disease.   - S/p barostim - Strict I&O, daily weights  3. CAD  - Hx of NSTEMI/STEMI s/p Emergent CABG 02/03/18: Cath with severe 2v CAD as above with severe LV dysfunction/ICM.  - s/p Emergent CABG 02/03/18.   - Had NSTEMI and now s/p LHC 02/12/19 with DES to the ostial ramus extending into the distal left main.  - No longer on brillinta or ASA due to bleeding and [facility] 152>294. Chest pain 6/10, persistent for the last 4 days (around the same time cough started) Doubt ischemic, checking EKG and trending trops - Continue hi-intensity statin.  4. Chronic Hypoxic Respiratory Failure  - Multifactorial, former smoker, h/o small cell lung cancer treated with chemo, chest XRT, COPD and HF - She has home O2 ([employer]). - Treating as above   5. Recurrent pleural effusions s/p pleurodesis - Tiny effusion R base   6. PAF -  CHA2DS2/VASc is at least 4. (CHF, Vasc disease, HTN, Female).   - She has history of Afib RVR in the past in the setting of her Lung CA.  - Previously on Xarelto but stopped due to hemoptysis.  - Remains in NSR  - Started on Eliquis in 12/22 due to renal infarct. - Continue Eliquis 2.5 mg bid. No bleeding issues.  7. 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 - Remains on Eliquis.     8. H/o SCLC:  - s/p treatment 2015. Lost to f/u since 04/2015.  - Chest CT 02/19/18 with mass-like consolidation in R hilum concerning for recurrent tumor.  - Repeat CT 03/27/2018 -No definite findings of locally recurrent tumor in the right lung. 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 Continued chest CT surveillance is advised in 3-6  months. - S/p thoracentesis 04/2018. Cytology negative for malignancy.  - CTA 11/19 stable scarring in the right hilum consistent with emphysema. Bronchoscopy on 11/15, no obvious source of bleeding  Length of Stay: 0  Alma Zollie Scale AGACNP-BC  11/14/2022, 10:51 AM  Advanced Heart Failure Team Pager [phone] (M-F; 7a - 5p)  Please contact [facility] for night-coverage after hours (4p -7a ) and weekends on [email or web address]  Patient seen with NP, agree with the above note.   History as noted above.   She is admitted with 4 days or so of worsening dyspnea and wheezing.  She has been coughing.  She has had pleuritic chest pain since the cough started 4 days ago.  CXR showed right basilar infiltrate.  Influenza+ with high BNP.  HS-TnI 152 => 294. Initial lactate 2.7, repeat 1.7. ECG non-acute.   Patient currently on Bipap.   She has had Solumedrol, nebs, abx, IV Lasix.   General: On bipap Neck: JVP 14 cm, no thyromegaly or thyroid nodule.  Lungs: End expiratory wheezes.  CV: Nondisplaced PMI.  Heart regular S1/S2, no S3/S4, no murmur.  No peripheral edema.  No carotid bruit.  Normal pedal  pulses.  Abdomen: Soft, nontender, no hepatosplenomegaly, no distention.  Skin: Intact without lesions or rashes.  Neurologic: Alert and oriented x 3.  Psych: Normal affect. Extremities: No clubbing or cyanosis.  HEENT: Normal.   Patient presents with influenza A infection.  She is also volume overloaded on exam with CHF exacerbation. With diffuse wheezes, she is also being treated for AECOPD (has baseline significant COPD).  Initial lactate mildly elevated, followup was in normal range.  - Oseltamivir for flu - Lasix 80 mg IV bid for volume overload, give dose now.  Continue home digoxin, dapagliflozin, and spironolactone.  - Cover for possible secondary PNA (RLL infiltrate) with ceftriaxone/doxycycline.  - Will continue steroids/nebs for COPD exacerbation.   Mildly elevated HS-TnI without significant trend.  ECG non-acute.  She has chest pain but it is pleuritic and seems to be related to wheezing/coughing.  Suspect demand ischemia.  - Would send 1 more HS-TnI.  Continue home apixaban.   Loralie Champagne 11/14/2022 2:43 PM

## 2022-11-14 NOTE — Progress Notes (Signed)
PHARMACY NOTE:  ANTIMICROBIAL RENAL DOSAGE ADJUSTMENT  Current antimicrobial regimen includes a mismatch between antimicrobial dosage and estimated renal function.  As per policy approved by the Pharmacy & Therapeutics and Medical Executive Committees, the antimicrobial dosage will be adjusted accordingly.  Current antimicrobial dosage:  Tamiflu 75mg  BID x 5 days  Indication: Influenza A  Renal Function:  Estimated Creatinine Clearance: 31.1 mL/min (A) (by C-G formula based on SCr of 1.28 mg/dL (H)).    Antimicrobial dosage has been changed to:  Tamiflu 75mg  x 1 dose followed by 30mg  BID x 9 more doses   Thank you for allowing pharmacy to be a part of this patient's care.  Berta Minor, [facility] 11/14/2022 11:52 AM

## 2022-11-14 NOTE — ED Notes (Signed)
This NT put a pure wick on this pt

## 2022-11-14 NOTE — Progress Notes (Signed)
Heart Failure Navigator Progress Note  Assessed for Heart & Vascular TOC clinic readiness.  Patient does not meet criteria due to prior to current hospitalization care established with AHF clinic and Dr. [name], last seen 11/08/22.   HF Navigation team will sign-off.  Pricilla Holm, MSN, RN Heart Failure Nurse Navigator

## 2022-11-14 NOTE — Progress Notes (Signed)
Pharmacy Antibiotic Note  Brittany [name] is a 61 y.o. female admitted on 11/14/2022.  Pharmacy has been consulted for vancomycin and cefepime dosing.  Plan: Cefepime 2g x1 followed by vancomycin 2g q12h Vancomycin 1000mg  x1 loading dose followed by vancomycin 1000mg  q48h (eAUC 465 using scr 1.28) Obtain vancomycin levels as appropriate Monitor renal function, cultures, and clinical progression   Height: 4\' 11"  (149.9 cm) Weight: 49.5 kg (109 lb 2 oz) IBW/kg (Calculated) : 43.2  Temp (24hrs), Avg:97.7 F (36.5 C), Min:97.7 F (36.5 C), Max:97.7 F (36.5 C)  Recent Labs  Lab 11/08/22 1428 11/14/22 0757  WBC 6.5 13.3*  CREATININE 1.19* 1.28*  LATICACIDVEN  --  2.7*    Estimated Creatinine Clearance: 31.1 mL/min (A) (by C-G formula based on SCr of 1.28 mg/dL (H)).    Allergies  Allergen Reactions   Codeine Nausea And Vomiting    Antimicrobials this admission: Cefepime 12/5 >>  Vancomycin 12/5 >>  Dose adjustments this admission:   Microbiology results: 12/5 bcx:   Thank you for allowing pharmacy to be a part of this patient's care.   Cristela Felt, PharmD, BCPS Clinical Pharmacist 11/14/2022 9:03 AM

## 2022-11-14 NOTE — ED Provider Notes (Signed)
[facility] EMERGENCY DEPARTMENT Provider Note   CSN: [id number] Arrival date & time: 11/14/22  0931     History  Chief Complaint  Patient presents with   Shortness of Breath    Brittany [name] is a 61 y.o. female.  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  Patient reports increasing shortness of breath and wheezing over the last 3 to 4 days.  Of note patient was seen in cardiology clinic on November 29.  Patient was noted to be wheezing during clinic visit.  Patient was placed on a short course of prednisone for same.  Patient reports increased wheezing and shortness of breath over the last 48 hours.  She reports nearly continuous use of 2 L nasal cannula supplemental O2 at home.  Patient reports breathing treatments at home.  Despite these actions patient's shortness of breath worsened.  EMS noted wheezing and respiratory distress on their initial evaluation.  Patient was brought to the ED via EMS.  Patient was given 1 DuoNeb treatment in route.  Patient complains of some mild nausea in addition to other previously reported symptoms.   The history is provided by the patient and medical records.       Home Medications Prior to Admission medications   Medication Sig Start Date End Date Taking? Authorizing Provider  albuterol (VENTOLIN HFA) 108 (90 Base) MCG/ACT inhaler Inhale 2 puffs into the lungs every 6 (six) hours as needed for wheezing or shortness of breath. 06/20/22   Domenic Polite, MD  apixaban (ELIQUIS) 2.5 MG TABS tablet Take 1 tablet (2.5 mg total) by mouth 2 (two) times daily. 08/09/22   [name], Maricela Bo, FNP  atorvastatin (LIPITOR) 80 MG tablet Take 1 tablet (80 mg total) by mouth daily at 6 PM.  04/16/22   Hongalgi, Lenis Dickinson, MD  blood glucose meter kit and supplies KIT Dispense based on patient and insurance preference. Use up to four times daily as directed. 04/11/21   Bonnielee Haff, MD  dapagliflozin propanediol (FARXIGA) 10 MG TABS tablet Take 1 tablet by mouth daily. 09/04/22   Bensimhon, Shaune Pascal, MD  digoxin (LANOXIN) 0.125 MG tablet Take 1 tablet (0.125 mg total) by mouth daily. 07/13/22   Rosita Fire, Brittainy M, PA-C  Glucerna (GLUCERNA) LIQD Take 237 mLs by mouth See admin instructions. Takes 1 bottle twice a week    [provider]  ipratropium-albuterol (DUONEB) 0.5-2.5 (3) MG/3ML SOLN Take 3 mLs by nebulization every 4 (four) hours as needed (for shortness of breath). 07/12/22   Larey Dresser, MD  ivabradine (CORLANOR) 7.5 MG TABS tablet Take 1 tablet (7.5 mg total) by mouth 2 (two) times daily with a meal. TAKE 1 TABLET (7.5 MG TOTAL) BY MOUTH 2 (TWO) TIMES DAILY WITH A MEAL. 10/10/22   Bensimhon, Shaune Pascal, MD  metolazone (ZAROXOLYN) 2.5 MG tablet TAKE 1 TABLET BY MOUTH AS DIRECTED. TAKE TODAY AND TOMORROW AND THEN HOLD TILL DIRECTED 10/06/22   Bensimhon, Shaune Pascal, MD  Multiple Vitamin (MULTIVITAMIN WITH MINERALS) TABS tablet Take 1 tablet by mouth daily. 12/15/21   Hongalgi, Lenis Dickinson, MD  naphazoline-pheniramine (VISINE) 0.025-0.3 % ophthalmic solution Place 1 drop into both eyes 4 (four) times daily as needed for eye irritation.    [provider]  nitroGLYCERIN (NITROSTAT) 0.4  MG SL tablet Place 1 tablet (0.4 mg total) under the tongue every 5 (five) minutes as needed for chest pain. 02/14/19   Shirley Friar, PA-C  potassium chloride SA (KLOR-CON M) 20 MEQ tablet Take 1 tablet (20 mEq total) by mouth daily. TAke extra 54mq when you take higher dose of torsemide or metolazone 07/26/22   [name], JMaricela Bo FNP  predniSONE (DELTASONE) 10 MG tablet Take 4 tablets (40 mg total) by mouth daily with breakfast for 2 days, THEN 3 tablets (30 mg total) daily with  breakfast for 2 days, THEN 2 tablets (20 mg total) daily with breakfast for 2 days, THEN 1 tablet (10 mg total) daily with breakfast for 2 days. 11/08/22 11/16/22  Bensimhon, DShaune Pascal MD  spironolactone (ALDACTONE) 25 MG tablet Take 1 tablet (25 mg total) by mouth daily. 05/19/22   [name], JMaricela Bo FNP  torsemide (DEMADEX) 20 MG tablet Take 3 tablets (60 mg total) by mouth daily. 07/13/22   SConsuelo Pandy PA-C      Allergies    Codeine    Review of Systems   Review of Systems  All other systems reviewed and are negative.   Physical Exam Updated Vital Signs SpO2 100%  Physical Exam Vitals and nursing note reviewed.  Constitutional:      General: She is in acute distress.     Appearance: Normal appearance. She is well-developed.  HENT:     Head: Normocephalic and atraumatic.  Eyes:     Conjunctiva/sclera: Conjunctivae normal.     Pupils: Pupils are equal, round, and reactive to light.  Cardiovascular:     Rate and Rhythm: Normal rate and regular rhythm.     Heart sounds: Normal heart sounds.  Pulmonary:     Effort: Tachypnea and respiratory distress present.     Comments: Diffuse expiratory wheezes in all lung fields Abdominal:     General: There is no distension.     Palpations: Abdomen is soft.     Tenderness: There is no abdominal tenderness.  Musculoskeletal:        General: No deformity. Normal range of motion.     Cervical back: Normal range of motion and neck supple.  Skin:    General: Skin is warm and dry.  Neurological:     General: No focal deficit present.     Mental Status: She is alert and oriented to person, place, and time.     ED Results / Procedures / Treatments   Labs (all labs ordered are listed, but only abnormal results are displayed) Labs Reviewed  RESP PANEL BY RT-PCR (FLU A&B, COVID) ARPGX2 - Abnormal; Notable for the following components:      Result Value   Influenza A by PCR POSITIVE (*)    All other components within normal limits   BASIC METABOLIC PANEL - Abnormal; Notable for the following components:   Glucose, Bld 184 (*)    Creatinine, Ser 1.28 (*)    GFR, Estimated 47 (*)    All other components within normal limits  CBC WITH DIFFERENTIAL/PLATELET - Abnormal; Notable for the following components:   WBC 13.3 (*)    RDW 19.6 (*)    Neutro Abs 12.0 (*)    Lymphs Abs 0.5 (*)    All other components within normal limits  BRAIN NATRIURETIC PEPTIDE - Abnormal; Notable for the following components:   B Natriuretic Peptide 3,393.9 (*)    All other components within normal limits  LACTIC ACID, PLASMA -  Abnormal; Notable for the following components:   Lactic Acid, Venous 2.7 (*)    All other components within normal limits  DIGOXIN LEVEL - Abnormal; Notable for the following components:   Digoxin Level 0.2 (*)    All other components within normal limits  I-STAT VENOUS BLOOD GAS, ED - Abnormal; Notable for the following components:   pCO2, Ven 37.9 (*)    pO2, Ven 130 (*)    Calcium, Ion 1.14 (*)    All other components within normal limits  TROPONIN I (HIGH SENSITIVITY) - Abnormal; Notable for the following components:   Troponin I (High Sensitivity) 152 (*)    All other components within normal limits  CULTURE, BLOOD (ROUTINE X 2)  CULTURE, BLOOD (ROUTINE X 2)  MRSA NEXT GEN BY PCR, NASAL  LACTIC ACID, PLASMA  PROCALCITONIN  STREP PNEUMONIAE URINARY ANTIGEN  LEGIONELLA PNEUMOPHILA SEROGP 1 UR AG  TROPONIN I (HIGH SENSITIVITY)    EKG None  Radiology No results found.  Procedures Procedures    Medications Ordered in ED Medications  methylPREDNISolone sodium succinate (SOLU-MEDROL) 125 mg/2 mL injection 125 mg (has no administration in time range)    ED Course/ Medical Decision Making/ A&P                           Medical Decision Making Amount and/or Complexity of Data Reviewed Labs: ordered. Radiology: ordered.  Risk Prescription drug management. Decision regarding  hospitalization.    Medical Screen Complete  This patient presented to the ED with complaint of dyspnea.  This complaint involves an extensive number of treatment options. The initial differential diagnosis includes, but is not limited to, rhonchal spasm, COPD exacerbation, CHF exacerbation, pneumonia, etc.  This presentation is: Acute, Chronic, Self-Limited, Previously Undiagnosed, Uncertain Prognosis, Complicated, Systemic Symptoms, and Threat to Life/Bodily Function  Patient with multiple comorbidities presents with complaint of shortness of breath, cough, and nausea.  Patient noted to be in moderate respiratory distress on initial evaluation.  Breathing treatment, BiPAP, steroids administered.  Chest x-ray reveals possible developing infiltrate in right lower lobe.  Broad spectrum antibiotics administered.  Presentation is most consistent with likely COPD exacerbation.  An element of CHF and/or early pneumonia cannot be excluded.  Patient is known to cardiology in the heart failure clinic.  Cardiology is aware and will consult.  Patient would benefit from admission.  Hospitalist service made aware of case and will evaluate for admission.    Additional history obtained:  External records from outside sources obtained and reviewed including prior ED visits and prior Inpatient records.    Lab Tests:  I ordered and personally interpreted labs.  The pertinent results include: CBC, BMP, BNP, troponin, lactic acid, COVID, flu, VBG, digoxin level   Imaging Studies ordered:  I ordered imaging studies including chest x-ray I independently visualized and interpreted obtained imaging which showed possible early infiltrate in right lower lobe I agree with the radiologist interpretation.   Cardiac Monitoring:  The patient was maintained on a cardiac monitor.  I personally viewed and interpreted the cardiac monitor which showed an underlying rhythm of: sinus tach   Medicines  ordered:  I ordered medication including breathing treatment, Solu-Medrol, Zofran, broad-spectrum antibiotics for bronchospasm, nausea, suspected infection Reevaluation of the patient after these medicines showed that the patient: improved  Problem List / ED Course:  Dyspnea   Reevaluation:  After the interventions noted above, I reevaluated the patient and found that they have: improved  Disposition:  After consideration of the diagnostic results and the patients response to treatment, I feel that the patent would benefit from admission.   CRITICAL CARE Performed by: Valarie Merino   Total critical care time: 30 minutes  Critical care time was exclusive of separately billable procedures and treating other patients.  Critical care was necessary to treat or prevent imminent or life-threatening deterioration.  Critical care was time spent personally by me on the following activities: development of treatment plan with patient and/or surrogate as well as nursing, discussions with consultants, evaluation of patient's response to treatment, examination of patient, obtaining history from patient or surrogate, ordering and performing treatments and interventions, ordering and review of laboratory studies, ordering and review of radiographic studies, pulse oximetry and re-evaluation of patient's condition.          Final Clinical Impression(s) / ED Diagnoses Final diagnoses:  Dyspnea, unspecified type    Rx / DC Orders ED Discharge Orders     None         Valarie Merino, MD 11/14/22 [phone]

## 2022-11-14 NOTE — Progress Notes (Signed)
RT transorted BIPAP patient from ED25 to ED8. Vital signs stable through out.

## 2022-11-14 NOTE — ED Notes (Signed)
Overdue troponin collected and sent to lab

## 2022-11-14 NOTE — ED Notes (Signed)
PT denies nausea. Pt resting with eyes closed. Occasional coughing. RT aware.

## 2022-11-14 NOTE — Progress Notes (Signed)
Pt placed on BIPAP 14/6 on 40% and is tolerating well. RT will monitor.

## 2022-11-14 NOTE — ED Notes (Signed)
RT at bedside. Pt coughing and nauseated. Pt vomited. EDP aware. Pt medicated. RT to return for BIPAP for pt.

## 2022-11-15 ENCOUNTER — Inpatient Hospital Stay (HOSPITAL_COMMUNITY): Payer: BC Managed Care – PPO

## 2022-11-15 ENCOUNTER — Other Ambulatory Visit: Payer: Self-pay

## 2022-11-15 DIAGNOSIS — I5022 Chronic systolic (congestive) heart failure: Secondary | ICD-10-CM | POA: Diagnosis not present

## 2022-11-15 DIAGNOSIS — J9621 Acute and chronic respiratory failure with hypoxia: Secondary | ICD-10-CM | POA: Diagnosis not present

## 2022-11-15 DIAGNOSIS — R0600 Dyspnea, unspecified: Secondary | ICD-10-CM

## 2022-11-15 DIAGNOSIS — J441 Chronic obstructive pulmonary disease with (acute) exacerbation: Secondary | ICD-10-CM | POA: Diagnosis not present

## 2022-11-15 LAB — CBC WITH DIFFERENTIAL/PLATELET
Abs Immature Granulocytes: 0.03 10*3/uL (ref 0.00–0.07)
Basophils Absolute: 0 10*3/uL (ref 0.0–0.1)
Basophils Relative: 0 %
Eosinophils Absolute: 0 10*3/uL (ref 0.0–0.5)
Eosinophils Relative: 0 %
HCT: 41 % (ref 36.0–46.0)
Hemoglobin: 12.6 g/dL (ref 12.0–15.0)
Immature Granulocytes: 0 %
Lymphocytes Relative: 3 %
Lymphs Abs: 0.3 10*3/uL — ABNORMAL LOW (ref 0.7–4.0)
MCH: 29.4 pg (ref 26.0–34.0)
MCHC: 30.7 g/dL (ref 30.0–36.0)
MCV: 95.8 fL (ref 80.0–100.0)
Monocytes Absolute: 0.5 10*3/uL (ref 0.1–1.0)
Monocytes Relative: 5 %
Neutro Abs: 9.3 10*3/uL — ABNORMAL HIGH (ref 1.7–7.7)
Neutrophils Relative %: 92 %
Platelets: 148 10*3/uL — ABNORMAL LOW (ref 150–400)
RBC: 4.28 MIL/uL (ref 3.87–5.11)
RDW: 19.6 % — ABNORMAL HIGH (ref 11.5–15.5)
WBC: 10.1 10*3/uL (ref 4.0–10.5)
nRBC: 0 % (ref 0.0–0.2)

## 2022-11-15 LAB — I-STAT VENOUS BLOOD GAS, ED
Acid-base deficit: 1 mmol/L (ref 0.0–2.0)
Bicarbonate: 26.6 mmol/L (ref 20.0–28.0)
Calcium, Ion: 1.18 mmol/L (ref 1.15–1.40)
HCT: 42 % (ref 36.0–46.0)
Hemoglobin: 14.3 g/dL (ref 12.0–15.0)
O2 Saturation: 67 %
Potassium: 4.1 mmol/L (ref 3.5–5.1)
Sodium: 140 mmol/L (ref 135–145)
TCO2: 28 mmol/L (ref 22–32)
pCO2, Ven: 55.8 mmHg (ref 44–60)
pH, Ven: 7.287 (ref 7.25–7.43)
pO2, Ven: 40 mmHg (ref 32–45)

## 2022-11-15 LAB — BRAIN NATRIURETIC PEPTIDE: B Natriuretic Peptide: 3188.1 pg/mL — ABNORMAL HIGH (ref 0.0–100.0)

## 2022-11-15 LAB — COMPREHENSIVE METABOLIC PANEL
ALT: 44 U/L (ref 0–44)
AST: 64 U/L — ABNORMAL HIGH (ref 15–41)
Albumin: 3.7 g/dL (ref 3.5–5.0)
Alkaline Phosphatase: 76 U/L (ref 38–126)
Anion gap: 17 — ABNORMAL HIGH (ref 5–15)
BUN: 28 mg/dL — ABNORMAL HIGH (ref 8–23)
CO2: 26 mmol/L (ref 22–32)
Calcium: 9.7 mg/dL (ref 8.9–10.3)
Chloride: 98 mmol/L (ref 98–111)
Creatinine, Ser: 1.51 mg/dL — ABNORMAL HIGH (ref 0.44–1.00)
GFR, Estimated: 39 mL/min — ABNORMAL LOW (ref >60)
Glucose, Bld: 153 mg/dL — ABNORMAL HIGH (ref 70–99)
Potassium: 4 mmol/L (ref 3.5–5.1)
Sodium: 141 mmol/L (ref 135–145)
Total Bilirubin: 0.9 mg/dL (ref 0.3–1.2)
Total Protein: 6.9 g/dL (ref 6.5–8.1)

## 2022-11-15 LAB — STREP PNEUMONIAE URINARY ANTIGEN: Strep Pneumo Urinary Antigen: NEGATIVE

## 2022-11-15 LAB — MRSA NEXT GEN BY PCR, NASAL: MRSA by PCR Next Gen: NOT DETECTED

## 2022-11-15 LAB — C-REACTIVE PROTEIN: CRP: 13.2 mg/dL — ABNORMAL HIGH (ref <1.0)

## 2022-11-15 LAB — PROCALCITONIN: Procalcitonin: 12.9 ng/mL

## 2022-11-15 LAB — PHOSPHORUS: Phosphorus: 4.8 mg/dL — ABNORMAL HIGH (ref 2.5–4.6)

## 2022-11-15 LAB — MAGNESIUM: Magnesium: 2.3 mg/dL (ref 1.7–2.4)

## 2022-11-15 MED ORDER — METHYLPREDNISOLONE SODIUM SUCC 40 MG IJ SOLR: 40.0000 mg | Freq: Every day | INTRAMUSCULAR | Status: DC

## 2022-11-15 MED ORDER — METHYLPREDNISOLONE SODIUM SUCC 125 MG IJ SOLR: 60.0000 mg | Freq: Every day | INTRAMUSCULAR | Status: DC

## 2022-11-15 MED ORDER — NITROGLYCERIN 0.4 MG SL SUBL: 0.4000 mg | SUBLINGUAL_TABLET | SUBLINGUAL | Status: DC | PRN

## 2022-11-15 MED ORDER — FUROSEMIDE 10 MG/ML IJ SOLN
80.0000 mg | Freq: Once | INTRAMUSCULAR | Status: AC
  Administered 2022-11-15: 80 mg via INTRAVENOUS
  Filled 2022-11-15: qty 8

## 2022-11-15 MED ORDER — METHYLPREDNISOLONE SODIUM SUCC 125 MG IJ SOLR
80.0000 mg | Freq: Two times a day (BID) | INTRAMUSCULAR | Status: DC
  Administered 2022-11-15: 80 mg via INTRAVENOUS
  Filled 2022-11-15: qty 2

## 2022-11-15 MED ORDER — OSELTAMIVIR PHOSPHATE 30 MG PO CAPS
30.0000 mg | ORAL_CAPSULE | Freq: Every day | ORAL | Status: DC
  Administered 2022-11-16: 30 mg via ORAL
  Filled 2022-11-15 (×2): qty 1

## 2022-11-15 MED ORDER — ALBUTEROL SULFATE (2.5 MG/3ML) 0.083% IN NEBU
10.0000 mg/h | INHALATION_SOLUTION | RESPIRATORY_TRACT | Status: AC
  Administered 2022-11-15: 10 mg/h via RESPIRATORY_TRACT

## 2022-11-15 NOTE — Evaluation (Signed)
Physical Therapy Evaluation Patient Details Name: Brittany [name] MRN: [id number] DOB: 04-23-1961 Today's Date: 11/15/2022  History of Present Illness  61 y.o. female who presents with complaints of progressively worsening shortness of breath over the last 3 to 4 days.  Pt diagnosed with acute influenza pneumonia along with CHF exacerbation causing acute hypoxic respiratory failure, she was placed on BiPAP.  PMH:  PAF, hypertension, previous small cell lung cancer treated with chemo/ chest XRT/ prophylactic brain radiation in 2015, CAD s/p CABG, chronic systolic HF EF ~62%, CKD stage IIIa, prior history of tobacco abuse  Clinical Impression  Pt admitted with above diagnosis. Pt was only able to perform UE and LE exercises and rolling and DOE 3/4 and pt could not tolerate sitting edge of stretcher. Pt reports she was Independent PTA and anticipate she will do well at d/c and need only HHPT f/u.  Will progress pt as able.  Pt currently with functional limitations due to the deficits listed below (see PT Problem List). Pt will benefit from skilled PT to increase their independence and safety with mobility to allow discharge to the venue listed below.          Recommendations for follow up therapy are one component of a multi-disciplinary discharge planning process, led by the attending physician.  Recommendations may be updated based on patient status, additional functional criteria and insurance authorization.  Follow Up Recommendations Home health PT      Assistance Recommended at Discharge Intermittent Supervision/Assistance  Patient can return home with the following  A little help with walking and/or transfers;A little help with bathing/dressing/bathroom;Assistance with cooking/housework;Help with stairs or ramp for entrance;Assist for transportation    Equipment Recommendations None recommended by PT  Recommendations for Other Services       Functional Status Assessment Patient  has had a recent decline in their functional status and demonstrates the ability to make significant improvements in function in a reasonable and predictable amount of time.     Precautions / Restrictions Precautions Precautions: Fall Restrictions Weight Bearing Restrictions: No      Mobility  Bed Mobility Overal bed mobility: Independent                  Transfers                   General transfer comment: Pt with difficulty breathing just moving UE, LEs and rolling.    Ambulation/Gait                  Stairs            Wheelchair Mobility    Modified Rankin (Stroke Patients Only)       Balance                                             Pertinent Vitals/Pain Pain Assessment Pain Assessment: No/denies pain    Home Living Family/patient expects to be discharged to:: Private residence Living Arrangements: Spouse/significant other Available Help at Discharge: Friend(s);Available PRN/intermittently Type of Home: House Home Access: Level entry     Alternate Level Stairs-Number of Steps: flight Home Layout: Two level;Able to live on main level with bedroom/bathroom Home Equipment: Rollator (4 wheels) (home O2 at 2L, Bipap) Additional Comments: Pt states she lives with significant other who is a Administrator, sister lives close by    Prior  Function Prior Level of Function : Independent/Modified Independent;Driving                     Hand Dominance   Dominant Hand: Right    Extremity/Trunk Assessment   Upper Extremity Assessment Upper Extremity Assessment: Defer to OT evaluation    Lower Extremity Assessment Lower Extremity Assessment: Generalized weakness    Cervical / Trunk Assessment Cervical / Trunk Assessment: Kyphotic  Communication   Communication: No difficulties  Cognition Arousal/Alertness: Awake/alert Behavior During Therapy: WFL for tasks assessed/performed Overall Cognitive  Status: Within Functional Limits for tasks assessed                                          General Comments General comments (skin integrity, edema, etc.): 84bpm, Bipap 40% with O2 100%, 102/51    Exercises General Exercises - Upper Extremity Shoulder Flexion: AROM, Both, 5 reps, Supine Elbow Flexion: AROM, Both, 5 reps, Supine General Exercises - Lower Extremity Ankle Circles/Pumps: AROM, Both, 5 reps, Supine Heel Slides: AROM, Both, 5 reps, Supine   Assessment/Plan    PT Assessment Patient needs continued PT services  PT Problem List Decreased balance;Decreased mobility;Decreased knowledge of precautions;Cardiopulmonary status limiting activity;Decreased safety awareness;Decreased knowledge of use of DME;Decreased activity tolerance       PT Treatment Interventions DME instruction;Gait training;Functional mobility training;Therapeutic activities;Therapeutic exercise;Balance training;Patient/family education    PT Goals (Current goals can be found in the Care Plan section)  Acute Rehab PT Goals Patient Stated Goal: to go home PT Goal Formulation: With patient Time For Goal Achievement: 11/29/22 Potential to Achieve Goals: Good    Frequency Min 3X/week     Co-evaluation               AM-PAC PT "6 Clicks" Mobility  Outcome Measure Help needed turning from your back to your side while in a flat bed without using bedrails?: None Help needed moving from lying on your back to sitting on the side of a flat bed without using bedrails?: A Little Help needed moving to and from a bed to a chair (including a wheelchair)?: A Little Help needed standing up from a chair using your arms (e.g., wheelchair or bedside chair)?: A Little Help needed to walk in hospital room?: A Little Help needed climbing 3-5 steps with a railing? : A Lot 6 Click Score: 18    End of Session Equipment Utilized During Treatment: Oxygen Activity Tolerance: Patient limited by  fatigue Patient left: with call bell/phone within reach (on stretcher) Nurse Communication: Mobility status PT Visit Diagnosis: Muscle weakness (generalized) (M62.81);Other abnormalities of gait and mobility (R26.89)    Time: 1117-1130 PT Time Calculation (min) (ACUTE ONLY): 13 min   Charges:   PT Evaluation $PT Eval Moderate Complexity: 1 Mod          Adja Ruff M,PT Acute Rehab Services [phone]   Alvira Philips 11/15/2022, 1:41 PM

## 2022-11-15 NOTE — ED Notes (Addendum)
Patient has increased work of breathing with expiratory wheezes. Patient placed back on BIPAP at this time

## 2022-11-15 NOTE — Progress Notes (Signed)
PROGRESS NOTE                                                                                                                                                                                                             Patient Demographics:    Brittany [name], is a 61 y.o. female, DOB - [birth year], LNL:[id number]  Outpatient Primary MD for the patient is Traci Sermon, PA-C    LOS - 1  Admit date - 11/14/2022    Chief Complaint  Patient presents with   Shortness of Breath       Brief Narrative (HPI from H&P)    61 y.o. 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  She was diagnosed with acute influenza pneumonia along with CHF exacerbation causing acute hypoxic respiratory failure, she was placed on BiPAP and admitted to the hospital.   Subjective:    Brittany [name] today has, No headache, No chest pain, No abdominal pain - No Nausea, No new weakness tingling or numbness, +ve cough, improving SOB.   Assessment  & Plan :   Sepsis secondary to influenza A with pneumonia, possibility of superimposed bacterial pneumonia as well with elevated procalcitonin and CRP, infection instigating acute on chronic diastolic CHF and acute on chronic hypoxic respiratory failure.  Patient's sepsis pathophysiology is improving she has been placed on Tamiflu for influenza A infection along with appropriate IV antibiotic combination of doxycycline and Rocephin, continue to monitor, she is still on BiPAP for oxygen supplementation and Lasix for diuresis.  Still overall tenuous will monitor closely.   Acute on chronic  respiratory failure with hypoxia COPD with exacerbation - her wheezing has improved significantly, continue IV steroids at a much lower dose, continue treatment as above with supplemental oxygen and nebulizer treatments.  He has been requested to sit in chair in the daytime use I-S and flutter valve for pulmonary toiletry.   Acute on chronic systolic heart failure EF 20%.  CHF team on board, continue diuresis with IV Lasix, on digoxin and Aldactone which will be continued, defer addition  of beta-blocker to the cardiology team.  To use ACE/ARB due to underlying CKD.  Elevated troponin CAD - High-sensitivity troponin 152-> 294.  This is demand ischemia from hypoxia caused by problem stated above, troponin trend is flat and in non-ACS pattern, she is chest pain-free.  Cardiology team on board.  Continue statin for secondary prevention.  PAF on chronic anticoagulation - CHA2DS2-VASc score equal to at least 4.  On Eliquis and digoxin.  Continue.   CKD stage IIIa - Stable   History of squamous cell lung cancer - Patient with prior treatment chemotherapy, chest radiation, and prophylactic brain radiation in 2015.        Condition - Extremely Guarded  Family Communication  :  None  Code Status :  Full  Consults  :  Cards  PUD Prophylaxis :    Procedures  :           Disposition Plan  :    Status is: Inpatient   DVT Prophylaxis  :    apixaban (ELIQUIS) tablet 2.5 mg Start: 11/14/22 1545 apixaban (ELIQUIS) tablet 2.5 mg     Lab Results  Component Value Date   PLT 148 (L) 11/15/2022    Diet :  Diet Order             Diet heart healthy/carb modified Room service appropriate? Yes; Fluid consistency: Thin  Diet effective now                    Inpatient Medications  Scheduled Meds:  apixaban  2.5 mg Oral BID   atorvastatin  80 mg Oral Daily   dapagliflozin propanediol  10 mg Oral Daily   digoxin  0.125 mg Oral Daily   furosemide  80 mg Intravenous BID    guaiFENesin  600 mg Oral BID   ipratropium-albuterol  3 mL Nebulization QID   ivabradine  7.5 mg Oral BID WC   [START ON 11/16/2022] methylPREDNISolone (SOLU-MEDROL) injection  40 mg Intravenous Daily   oseltamivir  30 mg Oral Daily   sodium chloride flush  3 mL Intravenous Q12H   spironolactone  25 mg Oral Daily   Continuous Infusions:  cefTRIAXone (ROCEPHIN)  IV Stopped (11/14/22 2247)   doxycycline (VIBRAMYCIN) IV Stopped (11/15/22 0748)   PRN Meds:.acetaminophen **OR** acetaminophen, albuterol, benzonatate, naphazoline-pheniramine, nitroGLYCERIN    Objective:   Vitals:   11/15/22 0550 11/15/22 0600 11/15/22 0630 11/15/22 0745  BP:  133/75 131/78 118/64  Pulse: 90 95 85 78  Resp:   (!) 28 18  Temp:    98.4 F (36.9 C)  TempSrc:    Oral  SpO2: 99% 98% 100% 100%  Weight:      Height:        Wt Readings from Last 3 Encounters:  11/14/22 49.5 kg  11/08/22 49.5 kg  08/09/22 48 kg     Intake/Output Summary (Last 24 hours) at 11/15/2022 0814 Last data filed at 11/14/2022 2247 Gross per 24 hour  Intake 1144.61 ml  Output 1000 ml  Net 144.61 ml     Physical Exam  Awake Alert, No new F.N deficits, Normal affect [place].AT,PERRAL Supple Neck, No JVD,   Symmetrical Chest wall movement, Good air movement bilaterally, +ve rales RRR,No Gallops,Rubs or new Murmurs,  +ve B.Sounds, Abd Soft, No tenderness,   No Cyanosis, Clubbing or edema       Data Review:    Recent Labs  Lab 11/08/22 1428 11/14/22 0757 11/14/22 0835 11/15/22 0122 11/15/22 0206  WBC 6.5 13.3*  --  10.1  --   HGB 13.8 13.3 14.6 12.6 14.3  HCT 44.4 40.9 43.0 41.0 42.0  PLT 188 172  --  148*  --   MCV 94.1 93.2  --  95.8  --   MCH 29.2 30.3  --  29.4  --   MCHC 31.1 32.5  --  30.7  --   RDW 18.6* 19.6*  --  19.6*  --   LYMPHSABS  --  0.5*  --  0.3*  --   MONOABS  --  0.8  --  0.5  --   EOSABS  --  0.0  --  0.0  --   BASOSABS  --  0.0  --  0.0  --     Recent Labs  Lab 11/08/22 1428  11/14/22 0757 11/14/22 0835 11/14/22 1009 11/14/22 1355 11/15/22 0122 11/15/22 0206  NA 139 140 140  --   --  141 140  K 3.2* 3.7 3.7  --   --  4.0 4.1  CL 106 104  --   --   --  98  --   CO2 24 23  --   --   --  26  --   GLUCOSE 129* 184*  --   --   --  153*  --   BUN 26* 22  --   --   --  28*  --   CREATININE 1.19* 1.28*  --   --   --  1.51*  --   AST  --   --   --   --   --  64*  --   ALT  --   --   --   --   --  44  --   ALKPHOS  --   --   --   --   --  76  --   BILITOT  --   --   --   --   --  0.9  --   ALBUMIN  --   --   --   --   --  3.7  --   CRP  --   --   --   --   --  13.2*  --   PROCALCITON  --   --   --   --  8.49 12.90  --   LATICACIDVEN  --  2.7*  --  1.7  --   --   --   BNP 1,395.6* 3,393.9*  --   --   --  3,188.1*  --   MG  --   --   --   --   --  2.3  --   CALCIUM 9.6 9.5  --   --   --  9.7  --       Recent Labs  Lab 11/08/22 1428 11/14/22 0757 11/14/22 1009 11/14/22 1355 11/15/22 0122  CRP  --   --   --   --  13.2*  PROCALCITON  --   --   --  8.49 12.90  LATICACIDVEN  --  2.7* 1.7  --   --   BNP 1,395.6* 3,393.9*  --   --  3,188.1*  MG  --   --   --   --  2.3  CALCIUM 9.6 9.5  --   --  9.7    Radiology Reports DG CHEST PORT 1 VIEW  Result Date: 11/15/2022 CLINICAL DATA:  Dyspnea, respiratory distress  EXAM: PORTABLE CHEST 1 VIEW COMPARISON:  11/14/2022 FINDINGS: The lungs are symmetrically well expanded. Patchy infiltrate is again seen within the right mid lung zone, possibly infectious or inflammatory in nature. Small right pleural effusion is again noted. No pneumothorax. No pleural effusion on the left. Coronary artery bypass grafting has been performed. Mild cardiomegaly is stable. Left subclavian pacemaker defibrillator is unchanged. Neurostimulator battery pack overlies the right hemithorax, unchanged. Pulmonary vascularity is normal. No acute bone abnormality. IMPRESSION: 1. Stable patchy infiltrate within the right mid lung zone, possibly  infectious or inflammatory in nature. 2. Stable small right pleural effusion. Electronically Signed   By: Fidela Salisbury M.D.   On: 11/15/2022 01:30   DG Chest Port 1 View  Result Date: 11/14/2022 CLINICAL DATA:  Shortness of breath. EXAM: PORTABLE CHEST 1 VIEW COMPARISON:  07/09/2022 FINDINGS: 0739 hours. The cardio pericardial silhouette is enlarged. Interstitial markings are diffusely coarsened with chronic features. Interval increase in right base atelectasis or infiltrate with stable scarring or tiny effusion at the right base. Left AICD again noted. Battery pack for right cervical stimulator device evident. Telemetry leads overlie the chest. IMPRESSION: Interval increase in right base atelectasis or infiltrate with stable scarring or tiny effusion at the right base. Electronically Signed   By: Misty Stanley M.D.   On: 11/14/2022 07:57      Signature  -   Lala Lund M.D on 11/15/2022 at 8:14 AM   -  To page go to [email or web address]

## 2022-11-15 NOTE — Progress Notes (Addendum)
Advanced Heart Failure Rounding Note  PCP-Cardiologist: Dr. [name]   Subjective:    On Ceftriaxone/doxycycline, steroids/nebs and Tamiflu.   AF. WBC 13>>10K   Reports good UOP w/ IV Lasix yesterday. Remains in ER. I/Os incomplete   Scr 1.28>>1.51   C/w pleuritic CP. HS trop down trending 152>294>424>362  Feels slightly better this morning but remains on BiPAP.   Objective:   Weight Range: 49.5 kg Body mass index is 22.04 kg/m.   Vital Signs:   Temp:  [97.7 F (36.5 C)-98.6 F (37 C)] 98 F (36.7 C) (12/06 0241) Pulse Rate:  [73-121] 85 (12/06 0630) Resp:  [14-41] 28 (12/06 0630) BP: (87-133)/(46-100) 131/78 (12/06 0630) SpO2:  [73 %-100 %] 100 % (12/06 0630) FiO2 (%):  [40 %] 40 % (12/06 0105) Weight:  [49.5 kg] 49.5 kg (12/05 0742) Last BM Date : 11/13/22  Weight change: Filed Weights   11/14/22 0742  Weight: 49.5 kg    Intake/Output:   Intake/Output Summary (Last 24 hours) at 11/15/2022 0731 Last data filed at 11/14/2022 2247 Gross per 24 hour  Intake 1144.61 ml  Output 1000 ml  Net 144.61 ml      Physical Exam    General:  Well appearing. No resp difficulty HEENT: Normal Neck: Supple. JVP . Carotids 2+ bilat; no bruits. No lymphadenopathy or thyromegaly appreciated. Cor: PMI nondisplaced. Regular rate & rhythm. No rubs, gallops or murmurs. Lungs: Clear Abdomen: Soft, nontender, nondistended. No hepatosplenomegaly. No bruits or masses. Good bowel sounds. Extremities: No cyanosis, clubbing, rash, edema Neuro: Alert & orientedx3, cranial nerves grossly intact. moves all 4 extremities w/o difficulty. Affect pleasant   Telemetry   Unable to analyze. + artifact from barostim   EKG    Poor tracing due to artifact from barostim. Appears sinus tach 101 bpm   Labs    CBC Recent Labs    11/14/22 0757 11/14/22 0835 11/15/22 0122 11/15/22 0206  WBC 13.3*  --  10.1  --   NEUTROABS 12.0*  --  9.3*  --   HGB 13.3   < > 12.6 14.3  HCT  40.9   < > 41.0 42.0  MCV 93.2  --  95.8  --   PLT 172  --  148*  --    < > = values in this interval not displayed.   Basic Metabolic Panel Recent Labs    11/14/22 0757 11/14/22 0835 11/15/22 0122 11/15/22 0206  NA 140   < > 141 140  K 3.7   < > 4.0 4.1  CL 104  --  98  --   CO2 23  --  26  --   GLUCOSE 184*  --  153*  --   BUN 22  --  28*  --   CREATININE 1.28*  --  1.51*  --   CALCIUM 9.5  --  9.7  --   MG  --   --  2.3  --   PHOS  --   --  4.8*  --    < > = values in this interval not displayed.   Liver Function Tests Recent Labs    11/15/22 0122  AST 64*  ALT 44  ALKPHOS 76  BILITOT 0.9  PROT 6.9  ALBUMIN 3.7   No results for input(s): "LIPASE", "AMYLASE" in the last 72 hours. Cardiac Enzymes No results for input(s): "CKTOTAL", "CKMB", "CKMBINDEX", "TROPONINI" in the last 72 hours.  BNP: BNP (last 3 results) Recent Labs  11/08/22 1428 11/14/22 0757 11/15/22 0122  BNP 1,395.6* 3,393.9* 3,188.1*    ProBNP (last 3 results) No results for input(s): "PROBNP" in the last 8760 hours.   D-Dimer No results for input(s): "DDIMER" in the last 72 hours. Hemoglobin A1C No results for input(s): "HGBA1C" in the last 72 hours. Fasting Lipid Panel No results for input(s): "CHOL", "HDL", "LDLCALC", "TRIG", "CHOLHDL", "LDLDIRECT" in the last 72 hours. Thyroid Function Tests No results for input(s): "TSH", "T4TOTAL", "T3FREE", "THYROIDAB" in the last 72 hours.  Invalid input(s): "FREET3"  Other results:   Imaging    DG CHEST PORT 1 VIEW  Result Date: 11/15/2022 CLINICAL DATA:  Dyspnea, respiratory distress EXAM: PORTABLE CHEST 1 VIEW COMPARISON:  11/14/2022 FINDINGS: The lungs are symmetrically well expanded. Patchy infiltrate is again seen within the right mid lung zone, possibly infectious or inflammatory in nature. Small right pleural effusion is again noted. No pneumothorax. No pleural effusion on the left. Coronary artery bypass grafting has been  performed. Mild cardiomegaly is stable. Left subclavian pacemaker defibrillator is unchanged. Neurostimulator battery pack overlies the right hemithorax, unchanged. Pulmonary vascularity is normal. No acute bone abnormality. IMPRESSION: 1. Stable patchy infiltrate within the right mid lung zone, possibly infectious or inflammatory in nature. 2. Stable small right pleural effusion. Electronically Signed   By: Fidela Salisbury M.D.   On: 11/15/2022 01:30   DG Chest Port 1 View  Result Date: 11/14/2022 CLINICAL DATA:  Shortness of breath. EXAM: PORTABLE CHEST 1 VIEW COMPARISON:  07/09/2022 FINDINGS: 0739 hours. The cardio pericardial silhouette is enlarged. Interstitial markings are diffusely coarsened with chronic features. Interval increase in right base atelectasis or infiltrate with stable scarring or tiny effusion at the right base. Left AICD again noted. Battery pack for right cervical stimulator device evident. Telemetry leads overlie the chest. IMPRESSION: Interval increase in right base atelectasis or infiltrate with stable scarring or tiny effusion at the right base. Electronically Signed   By: Misty Stanley M.D.   On: 11/14/2022 07:57     Medications:     Scheduled Medications:  apixaban  2.5 mg Oral BID   atorvastatin  80 mg Oral Daily   dapagliflozin propanediol  10 mg Oral Daily   digoxin  0.125 mg Oral Daily   furosemide  80 mg Intravenous BID   guaiFENesin  600 mg Oral BID   ipratropium-albuterol  3 mL Nebulization QID   ivabradine  7.5 mg Oral BID WC   [START ON 11/16/2022] methylPREDNISolone (SOLU-MEDROL) injection  40 mg Intravenous Daily   oseltamivir  30 mg Oral Daily   sodium chloride flush  3 mL Intravenous Q12H   spironolactone  25 mg Oral Daily    Infusions:  cefTRIAXone (ROCEPHIN)  IV Stopped (11/14/22 2247)   doxycycline (VIBRAMYCIN) IV 100 mg (11/15/22 0510)    PRN Medications: acetaminophen **OR** acetaminophen, albuterol, benzonatate, naphazoline-pheniramine,  nitroGLYCERIN    Patient Profile   Chanell Nadeau is a 61 y.o.Mar[place] Kitchen female with a history of PAF,  previous smoker quit 2015  years ago, hypertension, previous small cell lung cancer treated with chemo, chest XRT and prophylactic brain radiation in 2015, CAD s/p CABG and chronic systolic HF EF ~43%. Presented to the ED with worsening SOB, multifactorial from COPDE, CHF and found to be influenza +. AHF team asked to see for acute on chronic systolic heart failure.   Assessment/Plan   COPD w/ exacerbation, +influenza - tested + for influenza, WBC 13.3 on admit, now on abx and tamiflu - on BiPAP  -  recently on short prednisone taper, didn't complete - management per [facility]    2. Acute on chronic Systolic Heart Failure Due to ICM  - Has [employer] single chamber ICD  - Echo 07/10/22: EF 20-25%, global hypokinesis, GIIDD, RV systolic function, mildly reduced. RV size normal. Mild MR, trivial TR, aortic valve w/ mild-mod regurg.  - NYHA class IV, BNP >3000 - Volume overloaded, suspect 2/2 influenza w/ COPD exacerbation - diurese w/ IV Lasix 80 mg bid  - Continue digoxin 0.125mg  daily - Continue Farxiga 10 mg daily.  - Continue spiro 25 mg daily. - Continue Corlanor 7.5 mg bid - Failed Entresto due to hyperkalemia.   - No b-blocker due to intolerance with low output and bad COPD with frequent flares - Not a candidate for advanced therapies with her degree of lung disease.   - S/p barostim - Strict I&O, daily weights   3. CAD  - Hx of NSTEMI/STEMI s/p Emergent CABG 02/03/18: Cath with severe 2v CAD as above with severe LV dysfunction/ICM.  - s/p Emergent CABG 02/03/18.   - Had NSTEMI and now s/p LHC 02/12/19 with DES to the ostial ramus extending into the distal left main.  - No longer on brillinta or ASA due to bleeding and need for [facility]. - Chest pain 6/10, persistent for the last 5 days (around the same time cough started) Doubt ischemic. HsTrop [phone].  EKG non acute  - Continue  high-intensity statin.   4. Chronic Hypoxic Respiratory Failure  - Multifactorial, former smoker, h/o small cell lung cancer treated with chemo, chest XRT, COPD and HF - She has home O2 ([employer]). - Treating as above   5. Recurrent pleural effusions s/p pleurodesis - Tiny effusion R base   6. PAF - CHA2DS2/VASc is at least 4. (CHF, Vasc disease, HTN, Female).   - She has history of Afib RVR in the past in the setting of her Lung CA.  - Previously on Xarelto but stopped due to hemoptysis.  - Remains in NSR  - Started on Eliquis in 12/22 due to renal infarct. - Continue Eliquis 2.5 mg bid. No bleeding issues.   7. 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 - Remains on Eliquis.     8. H/o SCLC:  - s/p treatment 2015. Lost to f/u since 04/2015.  - Chest CT 02/19/18 with mass-like consolidation in R hilum concerning for recurrent tumor.  - Repeat CT 03/27/2018 -No definite findings of locally recurrent tumor in the right lung. 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 Continued chest CT surveillance is advised in 3-6 months. - S/p thoracentesis 04/2018. Cytology negative for malignancy.  - CTA 11/19 stable scarring in the right hilum consistent with emphysema. Bronchoscopy on 11/15, no obvious source of bleeding    Length of Stay: 1  Brittainy Simmons, PA-C  11/15/2022, 7:31 AM  Advanced Heart Failure Team Pager [phone] (M-F; 7a - 5p)  Please contact [facility] for night-coverage after hours (5p -7a ) and weekends on [email or web address]  Patient seen and examined with the above-signed Advanced Practice Provider and/or Housestaff. I personally reviewed laboratory data, imaging studies and relevant notes. I independently examined the patient and formulated the important aspects of the plan. I have edited the note to reflect any of my changes or salient points. I have personally discussed the plan with the  patient and/or family.  Now off Bipap. Still SOB. Scr up a bit.  No CP.   General:  Weak appearing. N SOB HEENT: normal Neck: supple. JVP 7-8  Carotids 2+ bilat; no bruits. No lymphadenopathy or thryomegaly appreciated. Cor: PMI nondisplaced. Regular rate & rhythm. No rubs, gallops or murmurs. Lungs: coarse  Abdomen: soft, nontender, nondistended. No hepatosplenomegaly. No bruits or masses. Good bowel sounds. Extremities: no cyanosis, clubbing, rash, edema Neuro: alert & orientedx3, cranial nerves grossly intact. moves all 4 extremities w/o difficulty. Affect pleasant  Respiratory status improving. Continue to treat for COPD flare and flu. Volume status improved. Will stop IV lasix for now. Hs trops now leveling off. Suspect demand ischemia. No need for further ischemia w/u.  Glori Bickers, MD  5:04 PM

## 2022-11-15 NOTE — Progress Notes (Signed)
[facility] night cross cover note:   I was notified by RN that the patient is feeling more short of breath earlier in the shift, noting that she has been on BiPAP throughout the duration of the shift, with O2 sats in the high 90s to 100% on BiPAP with the following settings: 16/6 with 40% FiO2.  Respiratory therapy has evaluated and is providing nebulizer treatment.  I evaluated the patient at bedside at which time the patient conveyed to me that she does feel more short of breath at this time than she did earlier in the shift, but overall feels improved in terms of her breathing relative to how she felt the initial presentation to the ED yesterday.   Per my chart review, it appears that she is admitted with acute on chronic systolic heart failure, influenza A, suspected acute COPD exacerbation, and potential atrial pneumonia.  She is evaluated by cardiology currently felt that she was volume along, recommending additional IV diuresis, with most recent dose of Lasix occurring yesterday afternoon.  I ordered stat chest x-ray.  Additionally, I ordered the following stat labs: CMP, CBC, magnesium level, phosphorus level, BNP, VBG.  Given cardiology's recommendation for additional IV diuresis, I have ordered an additional dose of Lasix 80 mg IV x 1 now.  Additionally, per chart view, as there is suspected to be no mention of acute COPD exacerbation, patient scheduled DuoNeb treatments are noted.  She received a dose of solumedrol 125 mg IV yesterday.  I am ordering additional solumedrol at this time, at 80 mg IV twice daily, with next dose to occur now.     Babs Bertin, DO Hospitalist

## 2022-11-15 NOTE — ED Notes (Signed)
Patient is requesting to be taken off Bipap machine, patient's RR and work of breathing has improved since continuous neb medication. Patient is no longer wheezing and breathing seems to have improved. This RN asked RT if patient can come off bipap after this Rns assessment. RT suggests placing patient on 4L [place] for 5-77mins and assessing how patient is feeling.

## 2022-11-15 NOTE — Progress Notes (Addendum)
RT called to patients room by patient. Patient was in obvious Distress and decreased and wheezing through out. RT gave a prn albuterol neb with no help and ordered a 10MG  albuterol 1 hour neb.Notified bedside RN to call MD and asked for and cxr if possible.Patient has shown some improvement but is still working harder to breath at this time.

## 2022-11-15 NOTE — Progress Notes (Signed)
Pt taken off BIPAP and placed on 4l [place] with RN at bedside. Pt is tolerating well, good spirits and wants food. RN aware pt cannot eat/or drink for 30 mins in case pt needs to go back on BIPAP. RT will monitor.

## 2022-11-16 ENCOUNTER — Inpatient Hospital Stay (HOSPITAL_COMMUNITY): Payer: BC Managed Care – PPO

## 2022-11-16 DIAGNOSIS — R0600 Dyspnea, unspecified: Secondary | ICD-10-CM

## 2022-11-16 DIAGNOSIS — I5022 Chronic systolic (congestive) heart failure: Secondary | ICD-10-CM | POA: Diagnosis not present

## 2022-11-16 DIAGNOSIS — J9601 Acute respiratory failure with hypoxia: Secondary | ICD-10-CM | POA: Diagnosis not present

## 2022-11-16 DIAGNOSIS — J9621 Acute and chronic respiratory failure with hypoxia: Secondary | ICD-10-CM | POA: Diagnosis not present

## 2022-11-16 DIAGNOSIS — J441 Chronic obstructive pulmonary disease with (acute) exacerbation: Secondary | ICD-10-CM | POA: Diagnosis not present

## 2022-11-16 LAB — CBC WITH DIFFERENTIAL/PLATELET
Abs Immature Granulocytes: 0.04 10*3/uL (ref 0.00–0.07)
Basophils Absolute: 0 10*3/uL (ref 0.0–0.1)
Basophils Relative: 0 %
Eosinophils Absolute: 0 10*3/uL (ref 0.0–0.5)
Eosinophils Relative: 0 %
HCT: 39.3 % (ref 36.0–46.0)
Hemoglobin: 12.6 g/dL (ref 12.0–15.0)
Immature Granulocytes: 0 %
Lymphocytes Relative: 4 %
Lymphs Abs: 0.4 10*3/uL — ABNORMAL LOW (ref 0.7–4.0)
MCH: 29.3 pg (ref 26.0–34.0)
MCHC: 32.1 g/dL (ref 30.0–36.0)
MCV: 91.4 fL (ref 80.0–100.0)
Monocytes Absolute: 0.5 10*3/uL (ref 0.1–1.0)
Monocytes Relative: 5 %
Neutro Abs: 9 10*3/uL — ABNORMAL HIGH (ref 1.7–7.7)
Neutrophils Relative %: 91 %
Platelets: 162 10*3/uL (ref 150–400)
RBC: 4.3 MIL/uL (ref 3.87–5.11)
RDW: 19.3 % — ABNORMAL HIGH (ref 11.5–15.5)
WBC: 9.9 10*3/uL (ref 4.0–10.5)
nRBC: 0 % (ref 0.0–0.2)

## 2022-11-16 LAB — BLOOD GAS, VENOUS
Acid-Base Excess: 2.7 mmol/L — ABNORMAL HIGH (ref 0.0–2.0)
Bicarbonate: 28.9 mmol/L — ABNORMAL HIGH (ref 20.0–28.0)
O2 Saturation: 97.6 %
Patient temperature: 37.4
pCO2, Ven: 51 mmHg (ref 44–60)
pH, Ven: 7.36 (ref 7.25–7.43)
pO2, Ven: 89 mmHg — ABNORMAL HIGH (ref 32–45)

## 2022-11-16 LAB — BASIC METABOLIC PANEL
Anion gap: 11 (ref 5–15)
BUN: 28 mg/dL — ABNORMAL HIGH (ref 8–23)
CO2: 26 mmol/L (ref 22–32)
Calcium: 9.3 mg/dL (ref 8.9–10.3)
Chloride: 102 mmol/L (ref 98–111)
Creatinine, Ser: 1.18 mg/dL — ABNORMAL HIGH (ref 0.44–1.00)
GFR, Estimated: 52 mL/min — ABNORMAL LOW (ref >60)
Glucose, Bld: 119 mg/dL — ABNORMAL HIGH (ref 70–99)
Potassium: 3.4 mmol/L — ABNORMAL LOW (ref 3.5–5.1)
Sodium: 139 mmol/L (ref 135–145)

## 2022-11-16 LAB — LEGIONELLA PNEUMOPHILA SEROGP 1 UR AG: L. pneumophila Serogp 1 Ur Ag: NEGATIVE

## 2022-11-16 LAB — MAGNESIUM: Magnesium: 2.3 mg/dL (ref 1.7–2.4)

## 2022-11-16 LAB — PROCALCITONIN: Procalcitonin: 6.85 ng/mL

## 2022-11-16 LAB — C-REACTIVE PROTEIN: CRP: 9.5 mg/dL — ABNORMAL HIGH (ref <1.0)

## 2022-11-16 LAB — BRAIN NATRIURETIC PEPTIDE: B Natriuretic Peptide: 1702.3 pg/mL — ABNORMAL HIGH (ref 0.0–100.0)

## 2022-11-16 MED ORDER — METHYLPREDNISOLONE SODIUM SUCC 125 MG IJ SOLR
60.0000 mg | Freq: Every day | INTRAMUSCULAR | Status: DC
  Administered 2022-11-16: 60 mg via INTRAVENOUS
  Filled 2022-11-16: qty 2

## 2022-11-16 MED ORDER — LORAZEPAM 0.5 MG PO TABS
0.5000 mg | ORAL_TABLET | Freq: Two times a day (BID) | ORAL | Status: DC | PRN
  Administered 2022-11-17: 0.5 mg via ORAL
  Filled 2022-11-16: qty 1

## 2022-11-16 MED ORDER — METHYLPREDNISOLONE SODIUM SUCC 40 MG IJ SOLR
40.0000 mg | Freq: Every day | INTRAMUSCULAR | Status: DC
  Administered 2022-11-17 – 2022-11-18 (×2): 40 mg via INTRAVENOUS
  Filled 2022-11-16 (×2): qty 1

## 2022-11-16 MED ORDER — IPRATROPIUM-ALBUTEROL 0.5-2.5 (3) MG/3ML IN SOLN
3.0000 mL | RESPIRATORY_TRACT | Status: DC
  Administered 2022-11-16 – 2022-11-18 (×9): 3 mL via RESPIRATORY_TRACT
  Filled 2022-11-16 (×11): qty 3

## 2022-11-16 MED ORDER — FUROSEMIDE 10 MG/ML IJ SOLN
60.0000 mg | Freq: Once | INTRAMUSCULAR | Status: AC
  Administered 2022-11-16: 60 mg via INTRAVENOUS
  Filled 2022-11-16: qty 6

## 2022-11-16 MED ORDER — OSELTAMIVIR PHOSPHATE 30 MG PO CAPS
30.0000 mg | ORAL_CAPSULE | Freq: Two times a day (BID) | ORAL | Status: AC
  Administered 2022-11-16 – 2022-11-18 (×5): 30 mg via ORAL
  Filled 2022-11-16 (×5): qty 1

## 2022-11-16 MED ORDER — POTASSIUM CHLORIDE CRYS ER 20 MEQ PO TBCR
40.0000 meq | EXTENDED_RELEASE_TABLET | Freq: Once | ORAL | Status: AC
  Administered 2022-11-16: 40 meq via ORAL
  Filled 2022-11-16: qty 2

## 2022-11-16 NOTE — Progress Notes (Signed)
Pt is currently eating, talking on the phone and on 5L [place] with shortness of breath. Refusing Bipap a this time. Not transfering pt to ICU at this time.

## 2022-11-16 NOTE — IPAL (Signed)
  Interdisciplinary Goals of Care Family Meeting   Date carried out: 11/16/2022  Location of the meeting: Bedside  Member's involved: Physician and Nurse Practitioner, CCM  Durable Power of Attorney or acting medical decision maker: Patient    Discussion: We discussed goals of care for Brittany [name] .  She is in acute respiratory failure secondary to influenza. She is tolerating BiPAP fairly at this time however has severe desaturations when off BiPAP. She has received tamiflu, antibiotics and diuretics but continues to have worsening respiratory status. Reasonable to continue BiPAP as tolerated however I explained to patient that this would not likely be sustainable for extended period of time and she would likely need mechanical intubation. Patient was clear in confirming that she is DNR with no intubation and no chest compressions, which is consistent with her living will. We discussed that she would not likely be able to come off BiPAP to eat and drink if her lungs continued to worsen. She stated that if this illness is terminal that she would be ok with that. "If I die from this, I will die from it but I do not want the vent."   Code status: Full DNR  Disposition: Continue current acute care  Time spent for the meeting: 40 min (included in CCM time)    Kiing Deakin Rodman Pickle, MD  11/16/2022, 12:00 PM

## 2022-11-16 NOTE — Evaluation (Signed)
Occupational Therapy Evaluation Patient Details Name: Brittany [name] MRN: [id number] DOB: 1961-07-30 Today's Date: 11/16/2022   History of Present Illness 61 y.o. female who presents with complaints of progressively worsening shortness of breath over the last 3 to 4 days.  Pt diagnosed with acute influenza pneumonia along with CHF exacerbation causing acute hypoxic respiratory failure, she was placed on BiPAP.  PMH:  PAF, hypertension, previous small cell lung cancer treated with chemo/ chest XRT/ prophylactic brain radiation in 2015, CAD s/p CABG, chronic systolic HF EF ~49%, CKD stage IIIa, prior history of tobacco abuse   Clinical Impression   Pt was independent prior to admission. Reports cooking entire Thanksgiving meal. Presents with generalized weakness and decreased activity tolerance. Pt limited to standing at EOB, declined remaining up in chair. Pt requires set up to min guard assist for ADLs. May benefit from use of rollator when she is agreeable to ambulation for energy conservation. Pt has a rollator at home. Assisted pt to order breakfast tray. Will follow acutely.      Recommendations for follow up therapy are one component of a multi-disciplinary discharge planning process, led by the attending physician.  Recommendations may be updated based on patient status, additional functional criteria and insurance authorization.   Follow Up Recommendations  No OT follow up     Assistance Recommended at Discharge Intermittent Supervision/Assistance  Patient can return home with the following A little help with walking and/or transfers;A little help with bathing/dressing/bathroom;Assistance with cooking/housework;Assist for transportation;Help with stairs or ramp for entrance    Functional Status Assessment  Patient has had a recent decline in their functional status and demonstrates the ability to make significant improvements in function in a reasonable and predictable amount  of time.  Equipment Recommendations   (TBD)    Recommendations for Other Services       Precautions / Restrictions Precautions Precautions: Fall Restrictions Weight Bearing Restrictions: No      Mobility Bed Mobility Overal bed mobility: Modified Independent             General bed mobility comments: HOB up    Transfers Overall transfer level: Needs assistance Equipment used: None Transfers: Sit to/from Stand Sit to Stand: Min guard                  Balance Overall balance assessment: Needs assistance   Sitting balance-Leahy Scale: Good       Standing balance-Leahy Scale: Fair                             ADL either performed or assessed with clinical judgement   ADL Overall ADL's : Needs assistance/impaired Eating/Feeding: Independent;Bed level   Grooming: Wash/dry hands;Wash/dry face;Set up;Sitting   Upper Body Bathing: Set up;Sitting   Lower Body Bathing: Min guard;Sit to/from stand   Upper Body Dressing : Set up;Sitting   Lower Body Dressing: Min guard;Sit to/from stand   Toilet Transfer: Min guard;Stand-pivot   Toileting- Water quality scientist and Hygiene: Set up;Sitting/lateral lean               Vision Ability to See in Adequate Light: 0 Adequate Patient Visual Report: No change from baseline       Perception     Praxis      Pertinent Vitals/Pain Pain Assessment Pain Assessment: No/denies pain     Hand Dominance Right   Extremity/Trunk Assessment Upper Extremity Assessment Upper Extremity Assessment: Generalized weakness   Lower Extremity  Assessment Lower Extremity Assessment: Generalized weakness   Cervical / Trunk Assessment Cervical / Trunk Assessment: Kyphotic   Communication Communication Communication: No difficulties   Cognition Arousal/Alertness: Awake/alert Behavior During Therapy: WFL for tasks assessed/performed Overall Cognitive Status: Within Functional Limits for tasks assessed                                        General Comments       Exercises     Shoulder Instructions      Home Living Family/patient expects to be discharged to:: Private residence Living Arrangements: Spouse/significant other (boyfriend) Available Help at Discharge: Available PRN/intermittently;Friend(s) Type of Home: House Home Access: Level entry     Home Layout: Two level;Able to live on main level with bedroom/bathroom Alternate Level Stairs-Number of Steps: flight Alternate Level Stairs-Rails: Right;Left Bathroom Shower/Tub: Occupational psychologist: Standard     Home Equipment: Rollator (4 wheels) (home 02 2L, bipap)   Additional Comments: Pt states she lives with significant other who is a Administrator, sister lives close by      Prior Functioning/Environment Prior Level of Function : Independent/Modified Independent;Driving                        OT Problem List: Decreased strength;Decreased activity tolerance;Cardiopulmonary status limiting activity      OT Treatment/Interventions: Self-care/ADL training;Energy conservation;DME and/or AE instruction;Patient/family education;Balance training;Therapeutic activities    OT Goals(Current goals can be found in the care plan section) Acute Rehab OT Goals OT Goal Formulation: With patient Time For Goal Achievement: 11/30/22 Potential to Achieve Goals: Good ADL Goals Pt Will Perform Grooming: Independently;standing Pt Will Perform Lower Body Bathing: with modified independence;sit to/from stand Pt Will Perform Lower Body Dressing: with modified independence;sit to/from stand Pt Will Transfer to Toilet: Independently;ambulating;regular height toilet Pt Will Perform Toileting - Clothing Manipulation and hygiene: Independently;sit to/from stand Additional ADL Goal #1: Pt will employ energy conservation and breathing techniques in ADLs and mobility.  OT Frequency: Min 2X/week     Co-evaluation              AM-PAC OT "6 Clicks" Daily Activity     Outcome Measure Help from another person eating meals?: None Help from another person taking care of personal grooming?: A Little Help from another person toileting, which includes using toliet, bedpan, or urinal?: A Little Help from another person bathing (including washing, rinsing, drying)?: A Little Help from another person to put on and taking off regular upper body clothing?: A Little Help from another person to put on and taking off regular lower body clothing?: A Little 6 Click Score: 19   End of Session Equipment Utilized During Treatment: Oxygen (5L) Nurse Communication: Other (comment) (ok to give coffee)  Activity Tolerance: Patient limited by fatigue Patient left: in bed;with call bell/phone within reach  OT Visit Diagnosis: Muscle weakness (generalized) (M62.81);Other (comment) (decreased activity tolerance)                Time: 1010-1026 OT Time Calculation (min): 16 min Charges:  OT General Charges $OT Visit: 1 Visit OT Evaluation $OT Eval Moderate Complexity: [place], OTR/L Acute Rehabilitation Services Office: [phone]   [name] 11/16/2022, 10:48 AM

## 2022-11-16 NOTE — TOC Initial Note (Addendum)
Transition of Care [facility]) - Initial/Assessment Note    Patient Details  Name: Brittany [name] MRN: [id number] Date of Birth: 1961-03-22  [facility]) CM/SW Contact:    Erenest Rasher, RN Phone Number: [phone] 11/16/2022, 2:27 PM  Clinical Narrative:                  HF TOC CM spoke to pt at bedside. Unable to speak due to pt wearing BIPAP. Pt was set up for NIV her previous admission. Explained TOC CM will continue to follow for dc needs.      Expected Discharge Plan: [place] Barriers to Discharge: Continued Medical Work up   Patient Goals and CMS Choice Patient states their goals for this hospitalization and ongoing recovery are:: wants to feel better      Expected Discharge Plan and Services Expected Discharge Plan: [place]   Discharge Planning Services: CM Consult Post Acute Care Choice: [place] arrangements for the past 2 months: Apartment                                      Prior Living Arrangements/Services Living arrangements for the past 2 months: Apartment Lives with:: Self Patient language and need for interpreter reviewed:: Yes Do you feel safe going back to the place where you live?: Yes      Need for Family Participation in Patient Care: No (Comment) Care giver support system in place?: Yes (comment) Current home services: DME (Rollator, oxygen ([employer]), scale) Criminal Activity/Legal Involvement Pertinent to Current Situation/Hospitalization: No - Comment as needed  Activities of Daily Living Home Assistive Devices/Equipment: BIPAP, Oxygen ADL Screening (condition at time of admission) Patient's cognitive ability adequate to safely complete daily activities?: Yes Is the patient deaf or have difficulty hearing?: No Does the patient have difficulty seeing, even when wearing glasses/contacts?: No Does the patient have difficulty concentrating, remembering, or making  decisions?: No Patient able to express need for assistance with ADLs?: Yes Does the patient have difficulty dressing or bathing?: No Independently performs ADLs?: Yes (appropriate for developmental age) Does the patient have difficulty walking or climbing stairs?: No Weakness of Legs: None Weakness of Arms/Hands: None  Permission Sought/Granted Permission sought to share information with : Case Manager, Family Supports, PCP Permission granted to share information with : Yes, Verbal Permission Granted  Share Information with NAME: Anice Paganini     Permission granted to share info w Relationship: SO  Permission granted to share info w Contact Information: [phone]  Emotional Assessment Appearance:: Appears stated age Attitude/Demeanor/Rapport: Engaged Affect (typically observed): Accepting Orientation: : Oriented to Self, Oriented to Place, Oriented to  Time, Oriented to Situation   Psych Involvement: No (comment)  Admission diagnosis:  Acute respiratory failure with hypoxia (HCC) [J96.01] Dyspnea, unspecified type [R06.00] Patient Active Problem List   Diagnosis Date Noted   Acute respiratory failure with hypoxia ([place]) 11/14/2022   Sepsis ([place]) 11/14/2022   Influenza A with pneumonia 11/14/2022   Elevated troponin 11/14/2022   History of lung cancer 11/14/2022   Acute combined systolic and diastolic CHF, NYHA class 4 ([place]) 07/09/2022   Malnutrition of moderate degree 06/20/2022   Lactic acidosis 04/12/2022   Prolonged QT interval 04/09/2022   Hypotension 04/09/2022   Chronic HFrEF (heart failure with reduced ejection fraction) ([place]) 12/11/2021   CHF exacerbation ([place]) 12/10/2021   Renal  infarct [facility]) 12/10/2021   COPD exacerbation ([place]) 09/29/2021   Mixed diabetic hyperlipidemia associated with type 2 diabetes mellitus ([place]) 09/29/2021   CKD (chronic kidney disease) stage 3, GFR 30-59 ml/min (HCC) 09/29/2021   Unstable angina ([place]) 02/09/2021   Hemoptysis 10/20/2020    Type 2 diabetes mellitus with stage 3a chronic kidney disease, without long-term current use of insulin ([place]) 10/20/2020   Acute on chronic combined systolic and diastolic CHF (congestive heart failure) ([place]) 05/21/2020   Ischemic cardiomyopathy 04/12/2020   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   NSTEMI (non-ST elevated myocardial infarction) ([place]) 02/12/2019   CHF (congestive heart failure) ([place]) 11/24/2018   Recurrent pleural effusion on right 05/05/2018   Acute on chronic respiratory failure with hypoxia ([place]) 05/05/2018   Chronic respiratory failure with hypoxia (HCC)    S/P CABG (coronary artery bypass graft) 02/03/2018   Asthma    Anxiety    Allergy    HCAP (healthcare-associated pneumonia) 03/15/2015   Chest pain 03/15/2015   Small cell lung cancer ([place]) 03/16/2014   Protein-calorie malnutrition, severe ([place]) 03/14/2014   AF (paroxysmal atrial fibrillation) ([place]) 03/12/2014   Essential hypertension 05/07/2012   PCP:  Traci Sermon, PA-C Pharmacy:   [id number] - [place], [place] [address] Phone: [phone] Fax: [phone]     Social Determinants of Health (SDOH) Interventions    Readmission Risk Interventions    06/20/2022   10:00 AM 04/11/2021   10:16 AM  Readmission Risk Prevention Plan  Transportation Screening Complete Complete  PCP or Specialist Appt within 3-5 Days Complete Complete  HRI or [place] Complete Complete  Social Work Consult for [place] Planning/Counseling Complete Complete  Palliative Care Screening Not Applicable Not Applicable  Medication Review Press photographer) Complete Complete

## 2022-11-16 NOTE — Progress Notes (Addendum)
PROGRESS NOTE                                                                                                                                                                                                             Patient Demographics:    Brittany [name], is a 61 y.o. female, DOB - December 08, 1961, GYJ:[id number]  Outpatient Primary MD for the patient is Traci Sermon, PA-C    LOS - 2  Admit date - 11/14/2022    Chief Complaint  Patient presents with   Shortness of Breath       Brief Narrative (HPI from H&P)    61 y.o. female with medical history significant of PAF, hypertension, previous small cell lung cancer treated with chemo/ chest XRT/ prophylactic brain radiation in 2015, CAD s/p CABG, chronic systolic HF EF ~26%, CKD stage IIIa, prior history of tobacco abuse who presents with complaints of progressively worsening shortness of breath over the last 3 to 4 days.  She was diagnosed with acute influenza pneumonia along with CHF exacerbation causing acute hypoxic respiratory failure, she was placed on BiPAP and admitted to the hospital.   Subjective:   Patient in bed, appears comfortable, denies any headache, no fever, no chest pain or pressure, improved shortness of breath , no abdominal pain. No new focal weakness.   Assessment  & Plan :   Sepsis secondary to influenza A with pneumonia, possibility of superimposed bacterial pneumonia as well with elevated procalcitonin and CRP, infection instigating acute on chronic diastolic CHF and acute on chronic hypoxic respiratory failure.  Patient's sepsis pathophysiology is improving she has been placed on Tamiflu for influenza A infection along with appropriate IV antibiotic combination of doxycycline and Rocephin, continue to monitor, now on PRN BiPAP for oxygen supplementation continue IV Lasix for diuresis, improving.  Addendum  - patient a few hrs later got  worse again, asked for BiPAP again, ICU team was consulted but patient affirmed that she is DNR does not want to get intubated, she also is wanting BiPAP only for 10 to 15 minutes thereafter she said she would want to be on nasal cannula oxygen and that if she dies she knows that very well.  Also seen by ICU team.   Acute on chronic respiratory failure with hypoxia COPD with exacerbation - her wheezing has improved significantly, continue  IV steroids at a much lower dose, continue treatment as above with supplemental oxygen and nebulizer treatments.  He has been requested to sit in chair in the daytime use I-S and flutter valve for pulmonary toiletry.   Acute on chronic systolic heart failure EF 20%.  CHF team on board, continue diuresis with IV Lasix, on digoxin and Aldactone which will be continued, defer addition of beta-blocker to the cardiology team.  To use ACE/ARB due to underlying CKD.  Elevated troponin CAD - High-sensitivity troponin 152-> 294.  This is demand ischemia from hypoxia caused by problem stated above, troponin trend is flat and in non-ACS pattern, she is chest pain-free.  Cardiology team on board.  Continue statin for secondary prevention.  PAF on chronic anticoagulation - CHA2DS2-VASc score equal to at least 4.  On Eliquis and digoxin. Continue.   CKD stage IIIa - Stable   History of squamous cell lung cancer - Patient with prior treatment chemotherapy, chest radiation, and prophylactic brain radiation in 2015.   Hypokalemia.  Replaced.       Condition - Extremely Guarded  Family Communication  :    Sister Olin Hauser [phone] - 11/16/22 message left at 12:27 PM  Berline Lopes [phone]  - 11/16/22  Code Status :  Full  Consults  :  Cards, PCCM  PUD Prophylaxis :    Procedures  :           Disposition Plan  :    Status is: Inpatient   DVT Prophylaxis  :    apixaban (ELIQUIS) tablet 2.5 mg Start: 11/14/22 1545 apixaban (ELIQUIS) tablet 2.5 mg      Lab Results  Component Value Date   PLT 162 11/16/2022    Diet :  Diet Order             DIET SOFT Fluid consistency: Thin; Fluid restriction: 1500 mL Fluid  Diet effective now                    Inpatient Medications  Scheduled Meds:  apixaban  2.5 mg Oral BID   atorvastatin  80 mg Oral Daily   dapagliflozin propanediol  10 mg Oral Daily   digoxin  0.125 mg Oral Daily   furosemide  60 mg Intravenous Once   guaiFENesin  600 mg Oral BID   ipratropium-albuterol  3 mL Nebulization QID   ivabradine  7.5 mg Oral BID WC   [START ON 11/17/2022] methylPREDNISolone (SOLU-MEDROL) injection  40 mg Intravenous Daily   oseltamivir  30 mg Oral Daily   potassium chloride  40 mEq Oral Once   sodium chloride flush  3 mL Intravenous Q12H   spironolactone  25 mg Oral Daily   Continuous Infusions:  cefTRIAXone (ROCEPHIN)  IV Stopped (11/15/22 2345)   doxycycline (VIBRAMYCIN) IV 100 mg (11/16/22 0600)   PRN Meds:.acetaminophen **OR** acetaminophen, albuterol, benzonatate, naphazoline-pheniramine, nitroGLYCERIN    Objective:   Vitals:   11/16/22 0011 11/16/22 0044 11/16/22 0751 11/16/22 0849  BP: (!) 115/46   98/63  Pulse: 96  81 82  Resp: (!) 24   20  Temp:      TempSrc:      SpO2: 100%   100%  Weight:  48.1 kg    Height:        Wt Readings from Last 3 Encounters:  11/16/22 48.1 kg  11/08/22 49.5 kg  08/09/22 48 kg     Intake/Output Summary (Last 24 hours) at 11/16/2022 7893 Last data filed  at 11/16/2022 0851 Gross per 24 hour  Intake 1158.21 ml  Output 1900 ml  Net -741.79 ml     Physical Exam  Awake Alert, No new F.N deficits, Normal affect [place].AT,PERRAL Supple Neck, No JVD,   Symmetrical Chest wall movement, Good air movement bilaterally, minimal rales RRR,No Gallops, Rubs or new Murmurs,  +ve B.Sounds, Abd Soft, No tenderness,   No Cyanosis, Clubbing or edema      Data Review:    Recent Labs  Lab 11/14/22 0757 11/14/22 0835 11/15/22 0122  11/15/22 0206 11/16/22 0139  WBC 13.3*  --  10.1  --  9.9  HGB 13.3 14.6 12.6 14.3 12.6  HCT 40.9 43.0 41.0 42.0 39.3  PLT 172  --  148*  --  162  MCV 93.2  --  95.8  --  91.4  MCH 30.3  --  29.4  --  29.3  MCHC 32.5  --  30.7  --  32.1  RDW 19.6*  --  19.6*  --  19.3*  LYMPHSABS 0.5*  --  0.3*  --  0.4*  MONOABS 0.8  --  0.5  --  0.5  EOSABS 0.0  --  0.0  --  0.0  BASOSABS 0.0  --  0.0  --  0.0    Recent Labs  Lab 11/14/22 0757 11/14/22 0835 11/14/22 1009 11/14/22 1355 11/15/22 0122 11/15/22 0206 11/16/22 0139  NA 140 140  --   --  141 140 139  K 3.7 3.7  --   --  4.0 4.1 3.4*  CL 104  --   --   --  98  --  102  CO2 23  --   --   --  26  --  26  GLUCOSE 184*  --   --   --  153*  --  119*  BUN 22  --   --   --  28*  --  28*  CREATININE 1.28*  --   --   --  1.51*  --  1.18*  AST  --   --   --   --  64*  --   --   ALT  --   --   --   --  44  --   --   ALKPHOS  --   --   --   --  76  --   --   BILITOT  --   --   --   --  0.9  --   --   ALBUMIN  --   --   --   --  3.7  --   --   CRP  --   --   --   --  13.2*  --  9.5*  PROCALCITON  --   --   --  8.49 12.90  --  6.85  LATICACIDVEN 2.7*  --  1.7  --   --   --   --   BNP 3,393.9*  --   --   --  3,188.1*  --  1,702.3*  MG  --   --   --   --  2.3  --  2.3  CALCIUM 9.5  --   --   --  9.7  --  9.3      Recent Labs  Lab 11/14/22 0757 11/14/22 1009 11/14/22 1355 11/15/22 0122 11/16/22 0139  CRP  --   --   --  13.2* 9.5*  PROCALCITON  --   --  8.49 12.90 6.85  LATICACIDVEN 2.7* 1.7  --   --   --   BNP 3,393.9*  --   --  3,188.1* 1,702.3*  MG  --   --   --  2.3 2.3  CALCIUM 9.5  --   --  9.7 9.3    Radiology Reports DG Chest Port 1 View  Result Date: 11/16/2022 CLINICAL DATA:  Shortness of breath. EXAM: PORTABLE CHEST 1 VIEW COMPARISON:  November 15, 2022. FINDINGS: Stable cardiomegaly. Left-sided defibrillator is unchanged. Status post coronary bypass graft. Left lung is clear. Improved right basilar and midlung  opacities are noted. The visualized skeletal structures are unremarkable. IMPRESSION: Improved right lung opacities. Electronically Signed   By: Marijo Conception M.D.   On: 11/16/2022 08:29   DG CHEST PORT 1 VIEW  Result Date: 11/15/2022 CLINICAL DATA:  Dyspnea, respiratory distress EXAM: PORTABLE CHEST 1 VIEW COMPARISON:  11/14/2022 FINDINGS: The lungs are symmetrically well expanded. Patchy infiltrate is again seen within the right mid lung zone, possibly infectious or inflammatory in nature. Small right pleural effusion is again noted. No pneumothorax. No pleural effusion on the left. Coronary artery bypass grafting has been performed. Mild cardiomegaly is stable. Left subclavian pacemaker defibrillator is unchanged. Neurostimulator battery pack overlies the right hemithorax, unchanged. Pulmonary vascularity is normal. No acute bone abnormality. IMPRESSION: 1. Stable patchy infiltrate within the right mid lung zone, possibly infectious or inflammatory in nature. 2. Stable small right pleural effusion. Electronically Signed   By: Fidela Salisbury M.D.   On: 11/15/2022 01:30   DG Chest Port 1 View  Result Date: 11/14/2022 CLINICAL DATA:  Shortness of breath. EXAM: PORTABLE CHEST 1 VIEW COMPARISON:  07/09/2022 FINDINGS: 0739 hours. The cardio pericardial silhouette is enlarged. Interstitial markings are diffusely coarsened with chronic features. Interval increase in right base atelectasis or infiltrate with stable scarring or tiny effusion at the right base. Left AICD again noted. Battery pack for right cervical stimulator device evident. Telemetry leads overlie the chest. IMPRESSION: Interval increase in right base atelectasis or infiltrate with stable scarring or tiny effusion at the right base. Electronically Signed   By: Misty Stanley M.D.   On: 11/14/2022 07:57      Signature  -   Lala Lund M.D on 11/16/2022 at 9:07 AM   -  To page go to [email or web address]

## 2022-11-16 NOTE — Consult Note (Signed)
NAME:  Brittany [name], MRN:  [id number], DOB:  1961/02/23, LOS: 2 ADMISSION DATE:  11/14/2022, CONSULTATION DATE: 11/16/2022 REFERRING MD: [facility], CHIEF COMPLAINT: Refractory hypoxia  History of Present Illness:  61 year old female who has an extensive past medical history is well-documented below is remarkable for severe coronary artery disease and congestive heart failure with an implanted automatic cardiac defibrillator she is minimally elevated in the past, interim 1 point, with stents and MI.  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  She has been BiPAP dependent for greater than 48 hours and a metabolic this time.  Pulmonary critical care was asked to evaluate for possible intubation.  Upon arrival of the critical care team she was adamant about not being intubated or have any CPR.  She is being transferred to intensive care unit for continuous BiPAP pulmonary critical care will be available as needed while she is in the ICU.  He is now a DNR with no intubation no CPR.  Please see Ipal note per MD.  Pertinent  Medical History   Past Medical History:  Diagnosis Date   Acute respiratory failure ([place]) 16/12/930   Acute systolic congestive heart failure ([place]) 02/03/2018   AICD (automatic cardioverter/defibrillator) present    Allergy    Anxiety    Asthma    DM2 (diabetes mellitus, type 2) ([place]) 10/20/2020   Hypertension    PAF (paroxysmal atrial fibrillation) ([place])    Presence of permanent cardiac pacemaker    Prophylactic measure 08/03/14-08/19/14   Prophyl. 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   LIMA to LAD, SVG to D1, SVG to OM1, EVH via right thigh with implantation of Impella LD LVAD via direct aortic approach   Small cell lung cancer ([place]) 03/16/2014   History  Significant Hospital  Events: Including procedures, antibiotic start and stop dates in addition to other pertinent events     Interim History / Subjective:  61 year old female with extensive cardiac pulmonary history he has been unable to come off noninvasive mechanical ventilatory support for 48 hours  Objective   Blood pressure 98/63, pulse 82, temperature 98 F (36.7 C), temperature source Oral, resp. rate (!) 26, height _0  (1.499 m), weight 48.1 kg, SpO2 100 %.    FiO2 (%):  [40 %] 40 %   Intake/Output Summary (Last 24 hours) at 11/16/2022 1143 Last data filed at 11/16/2022 0851 Gross per 24 hour  Intake 1158.21 ml  Output 1900 ml  Net -741.79 ml   Filed Weights   11/15/22 1900 11/15/22 1954 11/16/22 0044  Weight: 48.5 kg 48.5 kg 48.1 kg    Examination: General: Frail female who is awake and alert on BiPAP HENT: No JVD is appreciated Lungs: Bibasilar crackles anemia Cardiovascular: Heart sounds are distant but regular Abdomen: Soft positive bowel sounds Extremities: Warm dry without edema Neuro: Grossly intact without focal defect   Resolved Hospital Problem list     Assessment & Plan:  Acute on chronic hypoxic respiratory failure complicated by influenza A, severe COPD congestive heart failure.  She has been on BiPAP for 48 hours and cannot come off for more than 30 minutes without being in respiratory distress.  Pulmonary critical care asked to evaluate she is adamant about not being intubated or having CPR performed.  Therefore she was made a DNR 11/16/2022 at 1200 hrs. Continue oxygen  as needed Noninvasive mechanical ventilatory support as tolerated Bronchodilators Antibiotics Antivirals May transfer to intensive care unit for noninvasive but she is a DNR.  History of small cell lung cancer status post chemo and radiation in 2015 Consider CT of the chest  Congestive heart failure with EF 25% Per cardiology  3 coronary artery disease status post coronary bypass grafting.   Internal cardiac defibrillator in place Per cardiology  Diabetes type 2 CBG (last 3)  No results for input(s): "GLUCAP" in the last 72 hours.  Per primary  Chronic kidney disease Lab Results  Component Value Date   CREATININE 1.18 (H) 11/16/2022   CREATININE 1.51 (H) 11/15/2022   CREATININE 1.28 (H) 11/14/2022   CREATININE 1.19 (H) 01/13/2020   CREATININE 0.7 12/23/2014   CREATININE 0.9 09/22/2014   CREATININE 0.7 07/08/2014   Per primary   Best Practice (right click and "Reselect all SmartList Selections" daily)   Diet/type: NPO DVT prophylaxis:  GI prophylaxis: PPI Lines:   N/A Foley:  N/A Code Status: dnr    Delight Ovens still listed as a full code Will need to change Last date of multidisciplinary goals of care discussion [ 12//7/23] 11/16/2022 1130 hrs. patient is adamant about not being intubated, CPR therefore discharged with change in the [place].  Labs   CBC: Recent Labs  Lab 11/14/22 0757 11/14/22 0835 11/15/22 0122 11/15/22 0206 11/16/22 0139  WBC 13.3*  --  10.1  --  9.9  NEUTROABS 12.0*  --  9.3*  --  9.0*  HGB 13.3 14.6 12.6 14.3 12.6  HCT 40.9 43.0 41.0 42.0 39.3  MCV 93.2  --  95.8  --  91.4  PLT 172  --  148*  --  741    Basic Metabolic Panel: Recent Labs  Lab 11/14/22 0757 11/14/22 0835 11/15/22 0122 11/15/22 0206 11/16/22 0139  NA 140 140 141 140 139  K 3.7 3.7 4.0 4.1 3.4*  CL 104  --  98  --  102  CO2 23  --  26  --  26  GLUCOSE 184*  --  153*  --  119*  BUN 22  --  28*  --  28*  CREATININE 1.28*  --  1.51*  --  1.18*  CALCIUM 9.5  --  9.7  --  9.3  MG  --   --  2.3  --  2.3  PHOS  --   --  4.8*  --   --    GFR: Estimated Creatinine Clearance: 33.7 mL/min (A) (by C-G formula based on SCr of 1.18 mg/dL (H)). Recent Labs  Lab 11/14/22 0757 11/14/22 1009 11/14/22 1355 11/15/22 0122 11/16/22 0139  PROCALCITON  --   --  8.49 12.90 6.85  WBC 13.3*  --   --  10.1 9.9  LATICACIDVEN 2.7* 1.7  --   --   --      Liver Function Tests: Recent Labs  Lab 11/15/22 0122  AST 64*  ALT 44  ALKPHOS 76  BILITOT 0.9  PROT 6.9  ALBUMIN 3.7   No results for input(s): "LIPASE", "AMYLASE" in the last 168 hours. No results for input(s): "AMMONIA" in the last 168 hours.  ABG    Component Value Date/Time   PHART 7.392 04/19/2018 1857   PCO2ART 35.3 04/19/2018 1857   PO2ART 110.0 (H) 04/19/2018 1857   HCO3 28.9 (H) 11/16/2022 0526   TCO2 28 11/15/2022 0206   ACIDBASEDEF 1.0 11/15/2022 0206   O2SAT 97.6 11/16/2022 0526  Coagulation Profile: No results for input(s): "INR", "PROTIME" in the last 168 hours.  Cardiac Enzymes: No results for input(s): "CKTOTAL", "CKMB", "CKMBINDEX", "TROPONINI" in the last 168 hours.  HbA1C: Hgb A1c MFr Bld  Date/Time Value Ref Range Status  04/09/2022 01:08 PM 5.9 (H) 4.8 - 5.6 % Final    Comment:    (NOTE) Pre diabetes:          5.7%-6.4%  Diabetes:              >6.4%  Glycemic control for   <7.0% adults with diabetes   09/28/2021 09:13 PM 5.7 (H) 4.8 - 5.6 % Final    Comment:    (NOTE) Pre diabetes:          5.7%-6.4%  Diabetes:              >6.4%  Glycemic control for   <7.0% adults with diabetes     CBG: No results for input(s): "GLUCAP" in the last 168 hours.  Review of Systems:   10 point review of system taken, please see HPI for positives and negatives. Currently is BiPAP dependent cannot come off for more than an hour at a time.  Past Medical History:  She,  has a past medical history of Acute respiratory failure ([place]) (89/21/1942), Acute systolic congestive heart failure ([place]) (02/03/2018), AICD (automatic cardioverter/defibrillator) present, Allergy, Anxiety, Asthma, DM2 (diabetes mellitus, type 2) ([place]) (10/20/2020), Hypertension, PAF (paroxysmal atrial fibrillation) ([place]), Presence of permanent cardiac pacemaker, Prophylactic measure (08/03/14-08/19/14), S/P emergency CABG x 3 (02/03/2018), and Small cell lung cancer ([place])  (03/16/2014).   Surgical History:   Past Surgical History:  Procedure Laterality Date   BRONCHIAL BRUSHINGS  10/25/2020   Procedure: BRONCHIAL BRUSHINGS;  Surgeon: Collene Gobble, MD;  Location: [facility] ENDOSCOPY;  Service: Pulmonary;;   BRONCHIAL NEEDLE ASPIRATION BIOPSY  10/25/2020   Procedure: BRONCHIAL NEEDLE ASPIRATION BIOPSIES;  Surgeon: Collene Gobble, MD;  Location: [facility] ENDOSCOPY;  Service: Pulmonary;;   CARDIAC DEFIBRILLATOR PLACEMENT  08/15/2018   MDT Visia AF MRI VR ICD implanted by Dr Loel Lofty for primary prevention of sudden   CESAREAN SECTION     CORONARY ARTERY BYPASS GRAFT N/A 02/03/2018   Procedure: CORONARY ARTERY BYPASS GRAFTING (CABG);  Surgeon: Rexene Alberts, MD;  Location: [facility];  Service: Open Heart Surgery;  Laterality: N/A;  Time 3 using left internal mammary artery and endoscopically harvested right saphenous vein   CORONARY BALLOON ANGIOPLASTY N/A 02/03/2018   Procedure: CORONARY BALLOON ANGIOPLASTY;  Surgeon: Martinique, Peter M, MD;  Location: [place] CV LAB;  Service: Cardiovascular;  Laterality: N/A;   CORONARY STENT INTERVENTION N/A 02/12/2019   Procedure: CORONARY STENT INTERVENTION;  Surgeon: Wellington Hampshire, MD;  Location: [place] CV LAB;  Service: Cardiovascular;  Laterality: N/A;   CORONARY/GRAFT ACUTE MI REVASCULARIZATION N/A 02/03/2018   Procedure: Coronary/Graft Acute MI Revascularization;  Surgeon: Martinique, Peter M, MD;  Location: [place] CV LAB;  Service: Cardiovascular;  Laterality: N/A;   ENDOBRONCHIAL ULTRASOUND N/A 10/25/2020   Procedure: ENDOBRONCHIAL ULTRASOUND;  Surgeon: Collene Gobble, MD;  Location: [facility] ENDOSCOPY;  Service: Pulmonary;  Laterality: N/A;   FLEXIBLE BRONCHOSCOPY  10/25/2020   Procedure: FLEXIBLE BRONCHOSCOPY;  Surgeon: Collene Gobble, MD;  Location: [facility] ENDOSCOPY;  Service: Pulmonary;;   IABP INSERTION N/A 02/03/2018   Procedure: IABP Insertion;  Surgeon: Martinique, Peter M, MD;  Location: [place] CV LAB;  Service:  Cardiovascular;  Laterality: N/A;   INTRAOPERATIVE TRANSESOPHAGEAL ECHOCARDIOGRAM N/A 02/03/2018  Procedure: INTRAOPERATIVE TRANSESOPHAGEAL ECHOCARDIOGRAM;  Surgeon: Rexene Alberts, MD;  Location: [place];  Service: Open Heart Surgery;  Laterality: N/A;   LEFT HEART CATH AND CORONARY ANGIOGRAPHY N/A 02/03/2018   Procedure: LEFT HEART CATH AND CORONARY ANGIOGRAPHY;  Surgeon: Martinique, Peter M, MD;  Location: [place] CV LAB;  Service: Cardiovascular;  Laterality: N/A;   LEFT HEART CATH AND CORS/GRAFTS ANGIOGRAPHY N/A 02/12/2019   Procedure: LEFT HEART CATH AND CORS/GRAFTS ANGIOGRAPHY;  Surgeon: Wellington Hampshire, MD;  Location: [place] CV LAB;  Service: Cardiovascular;  Laterality: N/A;   MEDIASTINOSCOPY N/A 03/11/2014   Procedure: MEDIASTINOSCOPY;  Surgeon: Melrose Nakayama, MD;  Location: [place];  Service: Thoracic;  Laterality: N/A;   PLACEMENT OF IMPELLA LEFT VENTRICULAR ASSIST DEVICE  02/03/2018   Procedure: PLACEMENT OF [place] LEFT VENTRICULAR ASSIST DEVICE LD;  Surgeon: Rexene Alberts, MD;  Location: [place];  Service: Open Heart Surgery;;   REMOVAL OF [place] LEFT VENTRICULAR ASSIST DEVICE N/A 02/08/2018   Procedure: REMOVAL OF [place] LEFT VENTRICULAR ASSIST DEVICE;  Surgeon: Rexene Alberts, MD;  Location: [place];  Service: Open Heart Surgery;  Laterality: N/A;   RIGHT HEART CATH N/A 02/03/2018   Procedure: RIGHT HEART CATH;  Surgeon: Martinique, Peter M, MD;  Location: [place] CV LAB;  Service: Cardiovascular;  Laterality: N/A;   RIGHT HEART CATH N/A 05/09/2018   Procedure: RIGHT HEART CATH;  Surgeon: Jolaine Artist, MD;  Location: [place] CV LAB;  Service: Cardiovascular;  Laterality: N/A;   TEE WITHOUT CARDIOVERSION N/A 02/08/2018   Procedure: TRANSESOPHAGEAL ECHOCARDIOGRAM (TEE);  Surgeon: Rexene Alberts, MD;  Location: [place];  Service: Open Heart Surgery;  Laterality: N/A;   TUBAL LIGATION     VIDEO BRONCHOSCOPY WITH ENDOBRONCHIAL ULTRASOUND N/A 03/11/2014   Procedure: VIDEO  BRONCHOSCOPY WITH ENDOBRONCHIAL ULTRASOUND;  Surgeon: Melrose Nakayama, MD;  Location: [place];  Service: Thoracic;  Laterality: N/A;     Social History:   reports that she quit smoking about 8 years ago. Her smoking use included cigarettes. She has a 20.00 pack-year smoking history. She has never used smokeless tobacco. She reports current alcohol use of about 8.0 standard drinks of alcohol per week. She reports current drug use. Drug: Marijuana.   Family History:  Her family history includes Cancer in her maternal grandmother; Diabetes in her paternal grandmother; Heart attack in her mother; Heart disease in her mother; Hypertension in her maternal grandmother and mother. There is no history of Stroke.   Allergies Allergies  Allergen Reactions   Codeine Nausea And Vomiting     Home Medications  Prior to Admission medications   Medication Sig Start Date End Date Taking? Authorizing Provider  albuterol (VENTOLIN HFA) 108 (90 Base) MCG/ACT inhaler Inhale 2 puffs into the lungs every 6 (six) hours as needed for wheezing or shortness of breath. 06/20/22  Yes Domenic Polite, MD  apixaban (ELIQUIS) 2.5 MG TABS tablet Take 1 tablet (2.5 mg total) by mouth 2 (two) times daily. 08/09/22  Yes [name], Maricela Bo, FNP  atorvastatin (LIPITOR) 80 MG tablet Take 1 tablet (80 mg total) by mouth daily at 6 PM. 04/16/22  Yes Hongalgi, Lenis Dickinson, MD  dapagliflozin propanediol (FARXIGA) 10 MG TABS tablet Take 1 tablet by mouth daily. 09/04/22  Yes Bensimhon, Shaune Pascal, MD  digoxin (LANOXIN) 0.125 MG tablet Take 1 tablet (0.125 mg total) by mouth daily. 07/13/22  Yes Simmons, Brittainy M, PA-C  Glucerna (GLUCERNA) LIQD Take 237 mLs by mouth See  admin instructions. Takes 1 bottle twice a week   Yes [provider]  ipratropium-albuterol (DUONEB) 0.5-2.5 (3) MG/3ML SOLN Take 3 mLs by nebulization every 4 (four) hours as needed (for shortness of breath). 07/12/22  Yes Larey Dresser, MD  ivabradine (CORLANOR)  7.5 MG TABS tablet Take 1 tablet (7.5 mg total) by mouth 2 (two) times daily with a meal. TAKE 1 TABLET (7.5 MG TOTAL) BY MOUTH 2 (TWO) TIMES DAILY WITH A MEAL. 10/10/22  Yes Bensimhon, Shaune Pascal, MD  Multiple Vitamin (MULTIVITAMIN WITH MINERALS) TABS tablet Take 1 tablet by mouth daily. 12/15/21  Yes Hongalgi, Lenis Dickinson, MD  naphazoline-pheniramine (VISINE) 0.025-0.3 % ophthalmic solution Place 1 drop into both eyes 4 (four) times daily as needed for eye irritation.   Yes [provider]  nitroGLYCERIN (NITROSTAT) 0.4 MG SL tablet Place 1 tablet (0.4 mg total) under the tongue every 5 (five) minutes as needed for chest pain. 02/14/19  Yes Shirley Friar, PA-C  potassium chloride SA (KLOR-CON M) 20 MEQ tablet Take 1 tablet (20 mEq total) by mouth daily. TAke extra 16mq when you take higher dose of torsemide or metolazone 07/26/22  Yes [name], Jessica M, FNP  predniSONE (DELTASONE) 10 MG tablet Take 4 tablets (40 mg total) by mouth daily with breakfast for 2 days, THEN 3 tablets (30 mg total) daily with breakfast for 2 days, THEN 2 tablets (20 mg total) daily with breakfast for 2 days, THEN 1 tablet (10 mg total) daily with breakfast for 2 days. 11/08/22 11/16/22 Yes Bensimhon, DShaune Pascal MD  spironolactone (ALDACTONE) 25 MG tablet Take 1 tablet (25 mg total) by mouth daily. 05/19/22  Yes [name], JMaricela Bo FNP  torsemide (DEMADEX) 20 MG tablet Take 3 tablets (60 mg total) by mouth daily. 07/13/22  Yes SLyda JesterM, PA-C  blood glucose meter kit and supplies KIT Dispense based on patient and insurance preference. Use up to four times daily as directed. 04/11/21   KBonnielee Haff MD  metolazone (ZAROXOLYN) 2.5 MG tablet TAKE 1 TABLET BY MOUTH AS DIRECTED. TAKE TODAY AND TOMORROW AND THEN HOLD TILL DIRECTED Patient not taking: Reported on 11/14/2022 10/06/22   Bensimhon, DShaune Pascal MD     Critical care time: 353min     SRichardson LandryMinor ACNP Acute Care Nurse Practitioner [place] consult Amion 11/16/2022, 11:43 AM

## 2022-11-16 NOTE — Progress Notes (Signed)
Pt wanting to come off bipap as she does not want to be intubated and go to ICU. She is requesting to only be on 5L [place] and use her Albuterol MDI from home because that has worked for her in the past. This RT explained it is a different situation now that she has added the flu on top of her CHF and she needs the bipap at this time as she was in severe distress not even 20 minutes ago. Pt states she knows she is tired and knows what she wants but only wants to be on [place] at this time.

## 2022-11-16 NOTE — Progress Notes (Addendum)
Advanced Heart Failure Rounding Note  PCP-Cardiologist: Dr. [name]   Subjective:    On Ceftriaxone/doxycycline, steroids/nebs and Tamiflu.   AF. WBC 13>>10K   C/w pleuritic CP. HS trop down trending 152>294>424>362  Good diuresis with IV lasix. Held yesterday with bump in Cr. Now downtrending. Scr 1.28>>1.51>>1.18  Feels better this morning. Took patient off bipap so she could eat her breakfast, placed on 5L [place]. RN at bedside. Still with pleuritic CP. Breathing and cough improved.   Objective:   Weight Range: 48.1 kg Body mass index is 21.43 kg/m.   Vital Signs:   Temp:  [97.9 F (36.6 C)-99.1 F (37.3 C)] 98 F (36.7 C) (12/06 2325) Pulse Rate:  [81-96] 81 (12/07 0751) Resp:  [17-26] 24 (12/07 0011) BP: (102-123)/(46-76) 115/46 (12/07 0011) SpO2:  [99 %-100 %] 100 % (12/07 0011) FiO2 (%):  [40 %] 40 % (12/07 0751) Weight:  [48.1 kg-48.5 kg] 48.1 kg (12/07 0044) Last BM Date : 11/14/22  Weight change: Filed Weights   11/15/22 1900 11/15/22 1954 11/16/22 0044  Weight: 48.5 kg 48.5 kg 48.1 kg    Intake/Output:   Intake/Output Summary (Last 24 hours) at 11/16/2022 0830 Last data filed at 11/16/2022 0622 Gross per 24 hour  Intake 1158.21 ml  Output 1500 ml  Net -341.79 ml      Physical Exam    General:  chronically ill appearing.   HEENT: normal Neck: supple. JVD to jaw. Carotids 2+ bilat; no bruits. No lymphadenopathy or thyromegaly appreciated. Cor: PMI nondisplaced. Regular rate & rhythm. No rubs, gallops or murmurs. Lungs: expiratory wheezes and course Abdomen: soft, nontender, nondistended. No hepatosplenomegaly. No bruits or masses. Good bowel sounds. Extremities: no cyanosis, clubbing, rash, edema  Neuro: alert & oriented x 3, cranial nerves grossly intact. moves all 4 extremities w/o difficulty. Affect pleasant.   Telemetry   NSR 70s, 2-4PVCs/hr (Personally reviewed)    EKG    No new EKG to review  Labs    CBC Recent Labs     11/15/22 0122 11/15/22 0206 11/16/22 0139  WBC 10.1  --  9.9  NEUTROABS 9.3*  --  9.0*  HGB 12.6 14.3 12.6  HCT 41.0 42.0 39.3  MCV 95.8  --  91.4  PLT 148*  --  628   Basic Metabolic Panel Recent Labs    11/15/22 0122 11/15/22 0206 11/16/22 0139  NA 141 140 139  K 4.0 4.1 3.4*  CL 98  --  102  CO2 26  --  26  GLUCOSE 153*  --  119*  BUN 28*  --  28*  CREATININE 1.51*  --  1.18*  CALCIUM 9.7  --  9.3  MG 2.3  --  2.3  PHOS 4.8*  --   --    Liver Function Tests Recent Labs    11/15/22 0122  AST 64*  ALT 44  ALKPHOS 76  BILITOT 0.9  PROT 6.9  ALBUMIN 3.7   No results for input(s): "LIPASE", "AMYLASE" in the last 72 hours. Cardiac Enzymes No results for input(s): "CKTOTAL", "CKMB", "CKMBINDEX", "TROPONINI" in the last 72 hours.  BNP: BNP (last 3 results) Recent Labs    11/14/22 0757 11/15/22 0122 11/16/22 0139  BNP 3,393.9* 3,188.1* 1,702.3*    ProBNP (last 3 results) No results for input(s): "PROBNP" in the last 8760 hours.   D-Dimer No results for input(s): "DDIMER" in the last 72 hours. Hemoglobin A1C No results for input(s): "HGBA1C" in the last 72 hours. Fasting  Lipid Panel No results for input(s): "CHOL", "HDL", "LDLCALC", "TRIG", "CHOLHDL", "LDLDIRECT" in the last 72 hours. Thyroid Function Tests No results for input(s): "TSH", "T4TOTAL", "T3FREE", "THYROIDAB" in the last 72 hours.  Invalid input(s): "FREET3"  Other results:   Imaging    No results found.   Medications:     Scheduled Medications:  apixaban  2.5 mg Oral BID   atorvastatin  80 mg Oral Daily   dapagliflozin propanediol  10 mg Oral Daily   digoxin  0.125 mg Oral Daily   furosemide  60 mg Intravenous Once   guaiFENesin  600 mg Oral BID   ipratropium-albuterol  3 mL Nebulization QID   ivabradine  7.5 mg Oral BID WC   methylPREDNISolone (SOLU-MEDROL) injection  60 mg Intravenous Daily   oseltamivir  30 mg Oral Daily   potassium chloride  40 mEq Oral Once    potassium chloride  40 mEq Oral Once   sodium chloride flush  3 mL Intravenous Q12H   spironolactone  25 mg Oral Daily    Infusions:  cefTRIAXone (ROCEPHIN)  IV Stopped (11/15/22 2345)   doxycycline (VIBRAMYCIN) IV 100 mg (11/16/22 0600)    PRN Medications: acetaminophen **OR** acetaminophen, albuterol, benzonatate, naphazoline-pheniramine, nitroGLYCERIN    Patient Profile   Brittany [name] is a 61 y.o.[place] female with a history of PAF,  previous smoker quit 2015  years ago, hypertension, 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 Presented to the ED with worsening SOB, multifactorial from COPDE, CHF and found to be influenza +. AHF team asked to see for acute on chronic systolic heart failure.   Assessment/Plan   COPD w/ exacerbation, +influenza - tested + for influenza, WBC 13.3 on admit, now on abx and tamiflu - on BiPAP intermittently. Sats 100% on 5L [place] - recently on short prednisone taper, didn't complete - management per [facility]    2. Acute on chronic Systolic Heart Failure Due to ICM  - Has [employer] single chamber ICD  - Echo 07/10/22: EF 20-25%, global hypokinesis, GIIDD, RV systolic function, mildly reduced. RV size normal. Mild MR, trivial TR, aortic valve w/ mild-mod regurg.  - NYHA class IV, BNP >3000 - Volume overloaded, suspect 2/2 influenza w/ COPD exacerbation - Diuresed well with IV lasix, stopped yesterday with bump in Cr. Primary ordered 60 IV lasix this am. Will repeat in afternoon.  - Continue digoxin 0.125mg  daily - Continue Farxiga 10 mg daily.  - Continue spiro 25 mg daily. - Continue Corlanor 7.5 mg bid - Failed Entresto due to hyperkalemia.   - No b-blocker due to intolerance with low output and bad COPD with frequent flares - Not a candidate for advanced therapies with her degree of lung disease.   - S/p barostim - Strict I&O, daily weights    3. CAD  - Hx of NSTEMI/STEMI s/p Emergent CABG 02/03/18: Cath with severe 2v CAD as above with severe LV dysfunction/ICM.  - s/p Emergent CABG 02/03/18.   - Had NSTEMI and now s/p LHC 02/12/19 with DES to the ostial ramus extending into the distal left main.  - No longer on brillinta or ASA due to bleeding and need for [facility]. - Chest pain 6/10, persistent for the last 5 days (around the same time cough started) Doubt ischemic. HsTrop [phone].  EKG non acute  - Continue high-intensity statin.   4. Chronic Hypoxic Respiratory Failure  - Multifactorial,  former smoker, h/o small cell lung cancer treated with chemo, chest XRT, COPD and HF - She has home O2 ([employer]). - Treating as above   5. Recurrent pleural effusions s/p pleurodesis - Tiny effusion R base, not seen on repeat CXR today   6. PAF - CHA2DS2/VASc is at least 4. (CHF, Vasc disease, HTN, Female).   - She has history of Afib RVR in the past in the setting of her Lung CA.  - Previously on Xarelto but stopped due to hemoptysis.  - Remains in NSR  - Started on Eliquis in 12/22 due to renal infarct. - Continue Eliquis 2.5 mg bid. No bleeding issues.   7. 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 - Remains on Eliquis.     8. H/o SCLC:  - s/p treatment 2015. Lost to f/u since 04/2015.  - Chest CT 02/19/18 with mass-like consolidation in R hilum concerning for recurrent tumor.  - Repeat CT 03/27/2018 -No definite findings of locally recurrent tumor in the right lung. 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 Continued chest CT surveillance is advised in 3-6 months. - S/p thoracentesis 04/2018. Cytology negative for malignancy.  - CTA 11/19 stable scarring in the right hilum consistent with emphysema. Bronchoscopy on 11/15, no obvious source of bleeding    Length of Stay: [place], NP  11/16/2022, 8:30 AM  Advanced Heart Failure  Team Pager [phone] (M-F; 7a - 5p)  Please contact [facility] for night-coverage after hours (5p -7a ) and weekends on [email or web address]  Patient seen and examined with the above-signed Advanced Practice Provider and/or Housestaff. I personally reviewed laboratory data, imaging studies and relevant notes. I independently examined the patient and formulated the important aspects of the plan. I have edited the note to reflect any of my changes or salient points. I have personally discussed the plan with the patient and/or family.  Continue with coughing and wheezing. Remains SOB. No CP.  General:  Weak appearing. JVP to jaw HEENT: normal Neck: supple. JVP to jaw. Carotids 2+ bilat; no bruits. No lymphadenopathy or thryomegaly appreciated. Cor: PMI nondisplaced. Regular rate & rhythm. No rubs, gallops or murmurs. Lungs: + crackles and wheezing Abdomen: soft, nontender, nondistended. No hepatosplenomegaly. No bruits or masses. Good bowel sounds. Extremities: no cyanosis, clubbing, rash, edema Neuro: alert & orientedx3, cranial nerves grossly intact. moves all 4 extremities w/o difficulty. Affect pleasant  Remains volume overloaded. Resume lasix. Continue treatment for flu/COPD flare.   Glori Bickers, MD  10:10 AM

## 2022-11-17 ENCOUNTER — Inpatient Hospital Stay (HOSPITAL_COMMUNITY): Payer: BC Managed Care – PPO

## 2022-11-17 DIAGNOSIS — J441 Chronic obstructive pulmonary disease with (acute) exacerbation: Secondary | ICD-10-CM | POA: Diagnosis not present

## 2022-11-17 DIAGNOSIS — J9621 Acute and chronic respiratory failure with hypoxia: Secondary | ICD-10-CM | POA: Diagnosis not present

## 2022-11-17 DIAGNOSIS — R0600 Dyspnea, unspecified: Secondary | ICD-10-CM | POA: Diagnosis not present

## 2022-11-17 DIAGNOSIS — I5022 Chronic systolic (congestive) heart failure: Secondary | ICD-10-CM | POA: Diagnosis not present

## 2022-11-17 LAB — CBC WITH DIFFERENTIAL/PLATELET
Abs Immature Granulocytes: 0.02 10*3/uL (ref 0.00–0.07)
Basophils Absolute: 0 10*3/uL (ref 0.0–0.1)
Basophils Relative: 0 %
Eosinophils Absolute: 0 10*3/uL (ref 0.0–0.5)
Eosinophils Relative: 0 %
HCT: 38.5 % (ref 36.0–46.0)
Hemoglobin: 12.3 g/dL (ref 12.0–15.0)
Immature Granulocytes: 0 %
Lymphocytes Relative: 5 %
Lymphs Abs: 0.3 10*3/uL — ABNORMAL LOW (ref 0.7–4.0)
MCH: 29.5 pg (ref 26.0–34.0)
MCHC: 31.9 g/dL (ref 30.0–36.0)
MCV: 92.3 fL (ref 80.0–100.0)
Monocytes Absolute: 0.3 10*3/uL (ref 0.1–1.0)
Monocytes Relative: 6 %
Neutro Abs: 5.2 10*3/uL (ref 1.7–7.7)
Neutrophils Relative %: 89 %
Platelets: 132 10*3/uL — ABNORMAL LOW (ref 150–400)
RBC: 4.17 MIL/uL (ref 3.87–5.11)
RDW: 19.4 % — ABNORMAL HIGH (ref 11.5–15.5)
WBC: 5.8 10*3/uL (ref 4.0–10.5)
nRBC: 0.3 % — ABNORMAL HIGH (ref 0.0–0.2)

## 2022-11-17 LAB — BASIC METABOLIC PANEL
Anion gap: 10 (ref 5–15)
BUN: 30 mg/dL — ABNORMAL HIGH (ref 8–23)
CO2: 25 mmol/L (ref 22–32)
Calcium: 8.9 mg/dL (ref 8.9–10.3)
Chloride: 102 mmol/L (ref 98–111)
Creatinine, Ser: 1.26 mg/dL — ABNORMAL HIGH (ref 0.44–1.00)
GFR, Estimated: 48 mL/min — ABNORMAL LOW (ref >60)
Glucose, Bld: 178 mg/dL — ABNORMAL HIGH (ref 70–99)
Potassium: 4.2 mmol/L (ref 3.5–5.1)
Sodium: 137 mmol/L (ref 135–145)

## 2022-11-17 LAB — BRAIN NATRIURETIC PEPTIDE: B Natriuretic Peptide: 893.3 pg/mL — ABNORMAL HIGH (ref 0.0–100.0)

## 2022-11-17 LAB — C-REACTIVE PROTEIN: CRP: 4.5 mg/dL — ABNORMAL HIGH (ref <1.0)

## 2022-11-17 LAB — MAGNESIUM: Magnesium: 2.1 mg/dL (ref 1.7–2.4)

## 2022-11-17 MED ORDER — FUROSEMIDE 10 MG/ML IJ SOLN
60.0000 mg | Freq: Two times a day (BID) | INTRAMUSCULAR | Status: AC
  Administered 2022-11-17: 60 mg via INTRAVENOUS
  Filled 2022-11-17 (×2): qty 6

## 2022-11-17 MED ORDER — FUROSEMIDE 10 MG/ML IJ SOLN: 60.0000 mg | Freq: Two times a day (BID) | INTRAMUSCULAR | Status: DC

## 2022-11-17 MED ORDER — FUROSEMIDE 10 MG/ML IJ SOLN
40.0000 mg | Freq: Once | INTRAMUSCULAR | Status: AC
  Administered 2022-11-17: 40 mg via INTRAVENOUS
  Filled 2022-11-17: qty 4

## 2022-11-17 NOTE — Progress Notes (Signed)
Physical Therapy Treatment Patient Details Name: Brittany [name] MRN: [id number] DOB: September 23, 1961 Today's Date: 11/17/2022   History of Present Illness 61 y.o. female presented 11/14/22 with SOB. She was diagnosed with acute influenza pneumonia along with CHF exacerbation causing acute hypoxic respiratory failure. 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 Pt eventually agreeable to mobilize bed <> recliner, sitting up for ~30 min in between transfers. Pt able to take steps without UE support but did prefer x1 HHA when she fatigued. Noted mild trunk sway with standing mobility but no LOB. Encouraged continued mobility and use of incentive spirometer. Pt verbalized understanding. Will continue to follow acutely. Recommending HHPT vs no PT follow-up at d/c pending pt progress.     Recommendations for follow up therapy are one component of a multi-disciplinary discharge planning process, led by the attending physician.  Recommendations may be updated based on patient status, additional functional criteria and insurance authorization.  Follow Up Recommendations  Home health PT (vs no PT follow up pending progress)     Assistance Recommended at Discharge Intermittent Supervision/Assistance  Patient can return home with the following A little help with walking and/or transfers;A little help with bathing/dressing/bathroom;Assistance with cooking/housework;Help with stairs or ramp for entrance;Assist for transportation   Equipment Recommendations  None recommended by PT    Recommendations for Other Services       Precautions / Restrictions Precautions Precautions: Fall Precaution Comments: watch  SpO2 Restrictions Weight Bearing Restrictions: No     Mobility  Bed Mobility Overal bed mobility: Modified Independent             General bed mobility comments: HOB elevated, able to complete all bed mobility aspects without assistance    Transfers Overall transfer level: Needs assistance Equipment used: None, 1 person hand held assist Transfers: Sit to/from Stand, Bed to chair/wheelchair/BSC Sit to Stand: Supervision   Step pivot transfers: Supervision       General transfer comment: Supervision for safety, no LOB but mild trunk sway noted when coming to stand from EOB and stand step pivot to L to recliner. x1 HHA for return to bed after sitting up ~30 min.    Ambulation/Gait Ambulation/Gait assistance: Supervision Gait Distance (Feet): 4 Feet (x2 bouts of ~4 ft each) Assistive device: None, 1 person hand held assist Gait Pattern/deviations: Step-through pattern, Decreased stride length Gait velocity: reduced Gait velocity interpretation: <1.31 ft/sec, indicative of household ambulator   General Gait Details: Pt with slow gait from bed to recliner without UE support, x1 HHA to return to bed after sitting in recliner ~30 min. No LOB, but mild trunk sway noted, supervision for safety.   Stairs             Wheelchair Mobility    Modified Rankin (Stroke Patients Only)       Balance Overall balance assessment: Mild deficits observed, not formally tested                                          Cognition Arousal/Alertness: Awake/alert Behavior During Therapy: WFL for tasks assessed/performed Overall Cognitive Status: Within Functional Limits for tasks assessed  General Comments: Pt mildly upset at attempts to encourage pt to mobilize, stating she has difficulty just talking and does not want to over do it and make her breathing worse and that she just wants to wait until she feels better and  stronger. PT explained that the longer she does not mobilize and she stays in bed the weaker she will likely become with RN also encouraging pt to get up OOB. Pt eventually agreeable to mobility bed <> chair        Exercises      General Comments General comments (skin integrity, edema, etc.): VSS on 3L O2      Pertinent Vitals/Pain Pain Assessment Pain Assessment: Faces Faces Pain Scale: Hurts a little bit Pain Location: abdomen/chest with coughing Pain Descriptors / Indicators: Discomfort Pain Intervention(s): Limited activity within patient's tolerance, Monitored during session, Repositioned    Home Living                          Prior Function            PT Goals (current goals can now be found in the care plan section) Acute Rehab PT Goals Patient Stated Goal: to feel better PT Goal Formulation: With patient Time For Goal Achievement: 11/29/22 Potential to Achieve Goals: Good Progress towards PT goals: Progressing toward goals    Frequency    Min 3X/week      PT Plan Current plan remains appropriate    Co-evaluation              AM-PAC PT "6 Clicks" Mobility   Outcome Measure  Help needed turning from your back to your side while in a flat bed without using bedrails?: None Help needed moving from lying on your back to sitting on the side of a flat bed without using bedrails?: None Help needed moving to and from a bed to a chair (including a wheelchair)?: A Little Help needed standing up from a chair using your arms (e.g., wheelchair or bedside chair)?: A Little Help needed to walk in hospital room?: A Little Help needed climbing 3-5 steps with a railing? : A Little 6 Click Score: 20    End of Session Equipment Utilized During Treatment: Oxygen Activity Tolerance: Patient limited by fatigue Patient left: with call bell/phone within reach;in chair;with nursing/sitter in room Nurse Communication: Mobility status PT Visit Diagnosis:  Muscle weakness (generalized) (M62.81);Other abnormalities of gait and mobility (R26.89);Unsteadiness on feet (R26.81)     Time: 9518-8416 PT Time Calculation (min) (ACUTE ONLY): 26 min  Charges:  $Therapeutic Activity: 23-37 mins                     Moishe Spice, PT, DPT Acute Rehabilitation Services  Office: [phone]    Brittany [name] 11/17/2022, 5:03 PM

## 2022-11-17 NOTE — Progress Notes (Signed)
PROGRESS NOTE                                                                                                                                                                                                             Patient Demographics:    Brittany [name], is a 61 y.o. female, DOB - [birth year], QVZ:[id number]  Outpatient Primary MD for the patient is Traci Sermon, PA-C    LOS - 3  Admit date - 11/14/2022    Chief Complaint  Patient presents with   Shortness of Breath       Brief Narrative (HPI from H&P)    61 y.o. 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  She was diagnosed with acute influenza pneumonia along with CHF exacerbation causing acute hypoxic respiratory failure, she was placed on BiPAP and admitted to the hospital.   Subjective:   Patient in bed, appears comfortable, denies any headache, no fever, no chest pain or pressure, much improved shortness of breath , no abdominal pain. No focal weakness.   Assessment  & Plan :   Sepsis secondary to influenza A with pneumonia, possibility of superimposed bacterial pneumonia as well with elevated procalcitonin and CRP, infection instigating acute on chronic diastolic CHF and acute on chronic hypoxic respiratory failure.  Patient's sepsis pathophysiology is improving she has been placed on Tamiflu for influenza A infection along with appropriate IV antibiotic combination of doxycycline and Rocephin, continue to monitor, now on PRN BiPAP for oxygen supplementation continue IV Lasix for diuresis, improving.  Note there is element of anxiety in her  symptoms of hypoxia as well.  Added Ativan on 11/16/2022 with good benefit.  Note she has persistently expressed her wishes to be DNR and does not want to be intubated if she gets worse.   Acute on chronic respiratory failure with hypoxia COPD with exacerbation - her wheezing has improved significantly, continue IV steroids at a much lower dose, continue treatment as above with supplemental oxygen and nebulizer treatments.  He has been requested to sit in chair in the  daytime use I-S and flutter valve for pulmonary toiletry.   Acute on chronic systolic heart failure EF 20% with AICD.  CHF team on board, continue diuresis with IV Lasix, on digoxin and Aldactone which will be continued, defer addition of beta-blocker to the cardiology team.  To use ACE/ARB due to underlying CKD.  Elevated troponin CAD - High-sensitivity troponin 152-> 294.  This is demand ischemia from hypoxia caused by problem stated above, troponin trend is flat and in non-ACS pattern, she is chest pain-free.  Cardiology team on board.  Continue statin for secondary prevention.  PAF on chronic anticoagulation - CHA2DS2-VASc score equal to at least 4.  On Eliquis and digoxin. Continue.   CKD stage IIIa - Stable   History of squamous cell lung cancer - Patient with prior treatment chemotherapy, chest radiation, and prophylactic brain radiation in 2015.   Hypokalemia.  Replaced.       Condition - Extremely Guarded  Family Communication  :    Sister Olin Hauser [phone] - 11/16/22 message left at 12:27 PM  Berline Lopes [phone]  - 11/16/22  Code Status :  Full  Consults  :  Cards, PCCM  PUD Prophylaxis :    Procedures  :           Disposition Plan  :    Status is: Inpatient   DVT Prophylaxis  :    apixaban (ELIQUIS) tablet 2.5 mg Start: 11/14/22 1545 apixaban (ELIQUIS) tablet 2.5 mg     Lab Results  Component Value Date   PLT 132 (L) 11/17/2022    Diet :  Diet Order             DIET SOFT  Fluid consistency: Thin; Fluid restriction: 1500 mL Fluid  Diet effective now                    Inpatient Medications  Scheduled Meds:  apixaban  2.5 mg Oral BID   atorvastatin  80 mg Oral Daily   dapagliflozin propanediol  10 mg Oral Daily   digoxin  0.125 mg Oral Daily   furosemide  60 mg Intravenous BID   guaiFENesin  600 mg Oral BID   ipratropium-albuterol  3 mL Nebulization Q4H   ivabradine  7.5 mg Oral BID WC   methylPREDNISolone (SOLU-MEDROL) injection  40 mg Intravenous Daily   oseltamivir  30 mg Oral BID   sodium chloride flush  3 mL Intravenous Q12H   spironolactone  25 mg Oral Daily   Continuous Infusions:  cefTRIAXone (ROCEPHIN)  IV Stopped (11/16/22 2300)   doxycycline (VIBRAMYCIN) IV 100 mg (11/17/22 0716)   PRN Meds:.acetaminophen **OR** acetaminophen, albuterol, benzonatate, LORazepam, naphazoline-pheniramine, nitroGLYCERIN    Objective:   Vitals:   11/17/22 0000 11/17/22 0400 11/17/22 0600 11/17/22 0821  BP: 114/61 (!) 103/54    Pulse: 77 63    Resp: 14 13    Temp: 98 F (36.7 C) 98.6 F (37 C)    TempSrc: Oral Oral    SpO2: 100% 100%  100%  Weight:   47.8 kg   Height:        Wt Readings from Last 3 Encounters:  11/17/22 47.8 kg  11/08/22 49.5 kg  08/09/22 48 kg     Intake/Output Summary (Last 24 hours) at 11/17/2022 0834 Last data filed at 11/17/2022 0600 Gross per 24 hour  Intake 1690.4 ml  Output 2800 ml  Net -1109.6 ml     Physical Exam  Awake Alert, No new F.N  deficits, Normal affect [place].AT,PERRAL Supple Neck, No JVD,   Symmetrical Chest wall movement, Good air movement bilaterally, +ve rales RRR,No Gallops, Rubs or new Murmurs,  +ve B.Sounds, Abd Soft, No tenderness,   No Cyanosis, Clubbing or edema      Data Review:    Recent Labs  Lab 11/14/22 0757 11/14/22 0835 11/15/22 0122 11/15/22 0206 11/16/22 0139 11/17/22 0112  WBC 13.3*  --  10.1  --  9.9 5.8  HGB 13.3 14.6 12.6 14.3 12.6 12.3  HCT 40.9 43.0 41.0  42.0 39.3 38.5  PLT 172  --  148*  --  162 132*  MCV 93.2  --  95.8  --  91.4 92.3  MCH 30.3  --  29.4  --  29.3 29.5  MCHC 32.5  --  30.7  --  32.1 31.9  RDW 19.6*  --  19.6*  --  19.3* 19.4*  LYMPHSABS 0.5*  --  0.3*  --  0.4* 0.3*  MONOABS 0.8  --  0.5  --  0.5 0.3  EOSABS 0.0  --  0.0  --  0.0 0.0  BASOSABS 0.0  --  0.0  --  0.0 0.0    Recent Labs  Lab 11/14/22 0757 11/14/22 0835 11/14/22 1009 11/14/22 1355 11/15/22 0122 11/15/22 0206 11/16/22 0139 11/17/22 0112  NA 140 140  --   --  141 140 139 137  K 3.7 3.7  --   --  4.0 4.1 3.4* 4.2  CL 104  --   --   --  98  --  102 102  CO2 23  --   --   --  26  --  26 25  GLUCOSE 184*  --   --   --  153*  --  119* 178*  BUN 22  --   --   --  28*  --  28* 30*  CREATININE 1.28*  --   --   --  1.51*  --  1.18* 1.26*  AST  --   --   --   --  64*  --   --   --   ALT  --   --   --   --  44  --   --   --   ALKPHOS  --   --   --   --  76  --   --   --   BILITOT  --   --   --   --  0.9  --   --   --   ALBUMIN  --   --   --   --  3.7  --   --   --   CRP  --   --   --   --  13.2*  --  9.5* 4.5*  PROCALCITON  --   --   --  8.49 12.90  --  6.85  --   LATICACIDVEN 2.7*  --  1.7  --   --   --   --   --   BNP 3,393.9*  --   --   --  3,188.1*  --  1,702.3* 893.3*  MG  --   --   --   --  2.3  --  2.3 2.1  CALCIUM 9.5  --   --   --  9.7  --  9.3 8.9      Recent Labs  Lab 11/14/22 0757 11/14/22 1009 11/14/22 1355 11/15/22 0122 11/16/22  0139 11/17/22 0112  CRP  --   --   --  13.2* 9.5* 4.5*  PROCALCITON  --   --  8.49 12.90 6.85  --   LATICACIDVEN 2.7* 1.7  --   --   --   --   BNP 3,393.9*  --   --  3,188.1* 1,702.3* 893.3*  MG  --   --   --  2.3 2.3 2.1  CALCIUM 9.5  --   --  9.7 9.3 8.9    Radiology Reports DG Chest Port 1 View  Result Date: 11/17/2022 CLINICAL DATA:  Shortness of breath. EXAM: PORTABLE CHEST 1 VIEW COMPARISON:  11/16/2022 FINDINGS: 0644 hours. The cardio pericardial silhouette is enlarged. Left-sided AICD again  noted. Battery pack for right cervical stimulator device evident. There is pulmonary vascular congestion without overt pulmonary edema. Underlying diffuse interstitial opacity is similar to prior. Similar appearance subtle airspace disease in both lung bases. Pleural effusion. Telemetry leads overlie the chest. IMPRESSION: No substantial interval change in exam. Electronically Signed   By: Misty Stanley M.D.   On: 11/17/2022 07:07   DG Chest Port 1 View  Result Date: 11/16/2022 CLINICAL DATA:  Shortness of breath. EXAM: PORTABLE CHEST 1 VIEW COMPARISON:  November 15, 2022. FINDINGS: Stable cardiomegaly. Left-sided defibrillator is unchanged. Status post coronary bypass graft. Left lung is clear. Improved right basilar and midlung opacities are noted. The visualized skeletal structures are unremarkable. IMPRESSION: Improved right lung opacities. Electronically Signed   By: Marijo Conception M.D.   On: 11/16/2022 08:29   DG CHEST PORT 1 VIEW  Result Date: 11/15/2022 CLINICAL DATA:  Dyspnea, respiratory distress EXAM: PORTABLE CHEST 1 VIEW COMPARISON:  11/14/2022 FINDINGS: The lungs are symmetrically well expanded. Patchy infiltrate is again seen within the right mid lung zone, possibly infectious or inflammatory in nature. Small right pleural effusion is again noted. No pneumothorax. No pleural effusion on the left. Coronary artery bypass grafting has been performed. Mild cardiomegaly is stable. Left subclavian pacemaker defibrillator is unchanged. Neurostimulator battery pack overlies the right hemithorax, unchanged. Pulmonary vascularity is normal. No acute bone abnormality. IMPRESSION: 1. Stable patchy infiltrate within the right mid lung zone, possibly infectious or inflammatory in nature. 2. Stable small right pleural effusion. Electronically Signed   By: Fidela Salisbury M.D.   On: 11/15/2022 01:30   DG Chest Port 1 View  Result Date: 11/14/2022 CLINICAL DATA:  Shortness of breath. EXAM: PORTABLE CHEST 1  VIEW COMPARISON:  07/09/2022 FINDINGS: 0739 hours. The cardio pericardial silhouette is enlarged. Interstitial markings are diffusely coarsened with chronic features. Interval increase in right base atelectasis or infiltrate with stable scarring or tiny effusion at the right base. Left AICD again noted. Battery pack for right cervical stimulator device evident. Telemetry leads overlie the chest. IMPRESSION: Interval increase in right base atelectasis or infiltrate with stable scarring or tiny effusion at the right base. Electronically Signed   By: Misty Stanley M.D.   On: 11/14/2022 07:57      Signature  -   Lala Lund M.D on 11/17/2022 at 8:34 AM   -  To page go to [email or web address]

## 2022-11-17 NOTE — Progress Notes (Signed)
Patient resting comfortably this AM, with no respiratory distress noted, on [place].  Bipap currently not indicated.  Will continue to monitor.

## 2022-11-17 NOTE — Progress Notes (Signed)
BiPAP refused, pt states she will call for it if she needs it. Pt comfortable on [place] at this time w/ no increased WOB noted. Will continue to monitor.

## 2022-11-17 NOTE — Plan of Care (Signed)
PT alert and oriented. Was easily agitated and verbalized that she was sensitive to steroids and hadn't gotten much sleep during the night. PRN Ativan given with good results. PT has slept and states she feels much better. PT was also frustrated with the fluid restriction, education provided. Stated her normal allowance is 3252ml. Dr. Candiss Norse notified and stated it was ok if she had "a little extra fluid." PT remains on nasal canula, no need of bipap at this time.  Problem: Education: Goal: Ability to demonstrate management of disease process will improve Outcome: Progressing Goal: Ability to verbalize understanding of medication therapies will improve Outcome: Progressing Goal: Individualized Educational Video(s) Outcome: Progressing   Problem: Activity: Goal: Capacity to carry out activities will improve Outcome: Progressing   Problem: Cardiac: Goal: Ability to achieve and maintain adequate cardiopulmonary perfusion will improve Outcome: Progressing   Problem: Education: Goal: Knowledge of General Education information will improve Description: Including pain rating scale, medication(s)/side effects and non-pharmacologic comfort measures Outcome: Progressing   Problem: Health Behavior/Discharge Planning: Goal: Ability to manage health-related needs will improve Outcome: Progressing   Problem: Clinical Measurements: Goal: Ability to maintain clinical measurements within normal limits will improve Outcome: Progressing Goal: Will remain free from infection Outcome: Progressing Goal: Diagnostic test results will improve Outcome: Progressing Goal: Respiratory complications will improve Outcome: Progressing Goal: Cardiovascular complication will be avoided Outcome: Progressing   Problem: Activity: Goal: Risk for activity intolerance will decrease Outcome: Progressing   Problem: Nutrition: Goal: Adequate nutrition will be maintained Outcome: Progressing   Problem:  Coping: Goal: Level of anxiety will decrease Outcome: Progressing   Problem: Elimination: Goal: Will not experience complications related to bowel motility Outcome: Progressing Goal: Will not experience complications related to urinary retention Outcome: Progressing   Problem: Pain Managment: Goal: General experience of comfort will improve Outcome: Progressing   Problem: Safety: Goal: Ability to remain free from injury will improve Outcome: Progressing   Problem: Skin Integrity: Goal: Risk for impaired skin integrity will decrease Outcome: Progressing

## 2022-11-17 NOTE — Progress Notes (Addendum)
Advanced Heart Failure Rounding Note  PCP-Cardiologist: Dr. [name]   Subjective:    On Ceftriaxone/doxycycline, steroids/nebs and Tamiflu.   AF. WBC 13>>10>>5.8K    HS trop down trending 152>294>424>362  Good diuresis with IV lasix, -2.8L UOP, only down 1lb. BNP downtrending. Scr 1.28>>1.51>>1.18>>1.26  Feels much better this morning, minimal cough. Denies CP. Down to 4L [place].  Objective:   Weight Range: 47.8 kg Body mass index is 21.27 kg/m.   Vital Signs:   Temp:  [98 F (36.7 C)-98.6 F (37 C)] 98.6 F (37 C) (12/08 0400) Pulse Rate:  [63-89] 63 (12/08 0400) Resp:  [13-26] 13 (12/08 0400) BP: (91-114)/(52-63) 103/54 (12/08 0400) SpO2:  [100 %] 100 % (12/08 0400) FiO2 (%):  [40 %] 40 % (12/07 1105) Weight:  [47.8 kg] 47.8 kg (12/08 0600) Last BM Date : 11/16/22  Weight change: Filed Weights   11/15/22 1954 11/16/22 0044 11/17/22 0600  Weight: 48.5 kg 48.1 kg 47.8 kg    Intake/Output:   Intake/Output Summary (Last 24 hours) at 11/17/2022 0808 Last data filed at 11/17/2022 0600 Gross per 24 hour  Intake 1690.4 ml  Output 2800 ml  Net -1109.6 ml      Physical Exam    General:  chronically ill appearing.  No respiratory difficulty HEENT: normal Neck: supple. JVD ~15/16 cm. Carotids 2+ bilat; no bruits. No lymphadenopathy or thyromegaly appreciated. Cor: PMI nondisplaced. Regular rate & rhythm. No rubs, gallops or murmurs. Lungs: course Abdomen: soft, nontender, nondistended. No hepatosplenomegaly. No bruits or masses. Good bowel sounds. Extremities: no cyanosis, clubbing, rash, edema  Neuro: alert & oriented x 3, cranial nerves grossly intact. moves all 4 extremities w/o difficulty. Affect pleasant.   Telemetry   NSR 70s (Personally reviewed)    EKG    No new EKG to review  Labs    CBC Recent Labs    11/16/22 0139 11/17/22 0112  WBC 9.9 5.8  NEUTROABS 9.0* 5.2  HGB 12.6 12.3  HCT 39.3 38.5  MCV 91.4 92.3  PLT 162 132*   Basic  Metabolic Panel Recent Labs    11/15/22 0122 11/15/22 0206 11/16/22 0139 11/17/22 0112  NA 141   < > 139 137  K 4.0   < > 3.4* 4.2  CL 98  --  102 102  CO2 26  --  26 25  GLUCOSE 153*  --  119* 178*  BUN 28*  --  28* 30*  CREATININE 1.51*  --  1.18* 1.26*  CALCIUM 9.7  --  9.3 8.9  MG 2.3  --  2.3 2.1  PHOS 4.8*  --   --   --    < > = values in this interval not displayed.   Liver Function Tests Recent Labs    11/15/22 0122  AST 64*  ALT 44  ALKPHOS 76  BILITOT 0.9  PROT 6.9  ALBUMIN 3.7   No results for input(s): "LIPASE", "AMYLASE" in the last 72 hours. Cardiac Enzymes No results for input(s): "CKTOTAL", "CKMB", "CKMBINDEX", "TROPONINI" in the last 72 hours.  BNP: BNP (last 3 results) Recent Labs    11/15/22 0122 11/16/22 0139 11/17/22 0112  BNP 3,188.1* 1,702.3* 893.3*    ProBNP (last 3 results) No results for input(s): "PROBNP" in the last 8760 hours.   D-Dimer No results for input(s): "DDIMER" in the last 72 hours. Hemoglobin A1C No results for input(s): "HGBA1C" in the last 72 hours. Fasting Lipid Panel No results for input(s): "CHOL", "HDL", "LDLCALC", "  TRIG", "CHOLHDL", "LDLDIRECT" in the last 72 hours. Thyroid Function Tests No results for input(s): "TSH", "T4TOTAL", "T3FREE", "THYROIDAB" in the last 72 hours.  Invalid input(s): "FREET3"  Other results:   Imaging    DG Chest Port 1 View  Result Date: 11/17/2022 CLINICAL DATA:  Shortness of breath. EXAM: PORTABLE CHEST 1 VIEW COMPARISON:  11/16/2022 FINDINGS: 0644 hours. The cardio pericardial silhouette is enlarged. Left-sided AICD again noted. Battery pack for right cervical stimulator device evident. There is pulmonary vascular congestion without overt pulmonary edema. Underlying diffuse interstitial opacity is similar to prior. Similar appearance subtle airspace disease in both lung bases. Pleural effusion. Telemetry leads overlie the chest. IMPRESSION: No substantial interval change in  exam. Electronically Signed   By: Misty Stanley M.D.   On: 11/17/2022 07:07   DG Chest Port 1 View  Result Date: 11/16/2022 CLINICAL DATA:  Shortness of breath. EXAM: PORTABLE CHEST 1 VIEW COMPARISON:  November 15, 2022. FINDINGS: Stable cardiomegaly. Left-sided defibrillator is unchanged. Status post coronary bypass graft. Left lung is clear. Improved right basilar and midlung opacities are noted. The visualized skeletal structures are unremarkable. IMPRESSION: Improved right lung opacities. Electronically Signed   By: Marijo Conception M.D.   On: 11/16/2022 08:29     Medications:     Scheduled Medications:  apixaban  2.5 mg Oral BID   atorvastatin  80 mg Oral Daily   dapagliflozin propanediol  10 mg Oral Daily   digoxin  0.125 mg Oral Daily   guaiFENesin  600 mg Oral BID   ipratropium-albuterol  3 mL Nebulization Q4H   ivabradine  7.5 mg Oral BID WC   methylPREDNISolone (SOLU-MEDROL) injection  40 mg Intravenous Daily   oseltamivir  30 mg Oral BID   sodium chloride flush  3 mL Intravenous Q12H   spironolactone  25 mg Oral Daily    Infusions:  cefTRIAXone (ROCEPHIN)  IV Stopped (11/16/22 2300)   doxycycline (VIBRAMYCIN) IV 100 mg (11/17/22 0716)    PRN Medications: acetaminophen **OR** acetaminophen, albuterol, benzonatate, LORazepam, naphazoline-pheniramine, nitroGLYCERIN    Patient Profile   Brittany [name] is a 61 y.o.Brittany [name] female with a history of PAF,  previous smoker quit 2015  years ago, hypertension, previous small cell lung cancer treated with chemo, chest XRT and prophylactic brain radiation in 2015, CAD s/p CABG and chronic systolic HF EF ~08%. Presented to the ED with worsening SOB, multifactorial from COPDE, CHF and found to be influenza +. AHF team asked to see for acute on chronic systolic heart failure.   Assessment/Plan   COPD w/ exacerbation, +influenza - tested + for influenza, WBC 13.3 on admit, now on abx and tamiflu - on BiPAP intermittently. Sats  100% on 4L [place] - recently on short prednisone taper, didn't complete - management per [facility]    2. Acute on chronic Systolic Heart Failure Due to ICM  - Has [employer] single chamber ICD  - Echo 07/10/22: EF 20-25%, global hypokinesis, GIIDD, RV systolic function, mildly reduced. RV size normal. Mild MR, trivial TR, aortic valve w/ mild-mod regurg.  - NYHA class IV, BNP >3000 (893 today) - Volume overloaded, suspect 2/2 influenza w/ COPD exacerbation - Diuresed well with 60mg  IV lasix BID yesterday, weight down 1lb, Will repeat today - Continue digoxin 0.125mg  daily - Continue Farxiga 10 mg daily.  - Continue spiro 25 mg daily. - Continue Corlanor 7.5 mg bid - Failed Entresto due to hyperkalemia.   - No b-blocker due to intolerance with low output and bad  COPD with frequent flares - Not a candidate for advanced therapies with her degree of lung disease.   - S/p barostim - Strict I&O, daily weights   3. CAD  - Hx of NSTEMI/STEMI s/p Emergent CABG 02/03/18: Cath with severe 2v CAD as above with severe LV dysfunction/ICM.  - s/p Emergent CABG 02/03/18.   - Had NSTEMI and now s/p LHC 02/12/19 with DES to the ostial ramus extending into the distal left main.  - No longer on brillinta or ASA due to bleeding and need for [facility] - Chest pain 6/10, persistent for the last 5 days (around the same time cough started) Doubt ischemic. HsTrop [phone].  EKG non acute  - Continue high-intensity statin.   4. Chronic Hypoxic Respiratory Failure  - Multifactorial, former smoker, h/o small cell lung cancer treated with chemo, chest XRT, COPD and HF - She has home O2 ([employer]). - Treating as above   5. Recurrent pleural effusions s/p pleurodesis - Tiny effusion R base, not seen on repeat CXR today   6. PAF - CHA2DS2/VASc is at least 4. (CHF, Vasc disease, HTN, Female).   - She has history of Afib RVR in the past in the setting of her Lung CA.  - Previously on Xarelto but stopped due to hemoptysis.  -  Remains in NSR  - Started on Eliquis in 12/22 due to renal infarct. - Continue Eliquis 2.5 mg bid. No bleeding issues.   7. 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 - Remains on Eliquis.     8. H/o SCLC:  - s/p treatment 2015. Lost to f/u since 04/2015.  - Chest CT 02/19/18 with mass-like consolidation in R hilum concerning for recurrent tumor.  - Repeat CT 03/27/2018 -No definite findings of locally recurrent tumor in the right lung. 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 Continued chest CT surveillance is advised in 3-6 months. - S/p thoracentesis 04/2018. Cytology negative for malignancy.  - CTA 11/19 stable scarring in the right hilum consistent with emphysema. Bronchoscopy on 11/15, no obvious source of bleeding    Length of Stay: [place], NP  11/17/2022, 8:08 AM  Advanced Heart Failure Team Pager [phone] (M-F; 7a - 5p)  Please contact [facility] for night-coverage after hours (5p -7a ) and weekends on [email or web address]   Patient seen and examined with the above-signed Advanced Practice Provider and/or Housestaff. I personally reviewed laboratory data, imaging studies and relevant notes. I independently examined the patient and formulated the important aspects of the plan. I have edited the note to reflect any of my changes or salient points. I have personally discussed the plan with the patient and/or family.  Breathing better. Less cough. No fevers or chills. Has diuresed modestly. No CP  General:  Lying in bed No resp difficulty HEENT: normal Neck: supple.JVP 8-9 Carotids 2+ bilat; no bruits. No lymphadenopathy or thryomegaly appreciated. Cor: PMI nondisplaced. Regular rate & rhythm. No rubs, gallops or murmurs. Lungs: coarse  Abdomen: soft, nontender, nondistended. No hepatosplenomegaly. No bruits or masses. Good bowel sounds. Extremities: no cyanosis, clubbing, rash,  edema Neuro: alert & orientedx3, cranial nerves grossly intact. moves all 4 extremities w/o difficulty. Affect pleasant  Improving but still mildly volume overloaded. Continue IV diuresis for one more day.   Glori Bickers, MD  2:41 PM

## 2022-11-18 ENCOUNTER — Inpatient Hospital Stay (HOSPITAL_COMMUNITY): Payer: BC Managed Care – PPO

## 2022-11-18 DIAGNOSIS — J9621 Acute and chronic respiratory failure with hypoxia: Secondary | ICD-10-CM | POA: Diagnosis not present

## 2022-11-18 DIAGNOSIS — I5022 Chronic systolic (congestive) heart failure: Secondary | ICD-10-CM | POA: Diagnosis not present

## 2022-11-18 DIAGNOSIS — R0600 Dyspnea, unspecified: Secondary | ICD-10-CM | POA: Diagnosis not present

## 2022-11-18 DIAGNOSIS — J441 Chronic obstructive pulmonary disease with (acute) exacerbation: Secondary | ICD-10-CM | POA: Diagnosis not present

## 2022-11-18 LAB — CBC WITH DIFFERENTIAL/PLATELET
Abs Immature Granulocytes: 0.03 10*3/uL (ref 0.00–0.07)
Basophils Absolute: 0 10*3/uL (ref 0.0–0.1)
Basophils Relative: 0 %
Eosinophils Absolute: 0 10*3/uL (ref 0.0–0.5)
Eosinophils Relative: 0 %
HCT: 39.9 % (ref 36.0–46.0)
Hemoglobin: 13 g/dL (ref 12.0–15.0)
Immature Granulocytes: 0 %
Lymphocytes Relative: 5 %
Lymphs Abs: 0.3 10*3/uL — ABNORMAL LOW (ref 0.7–4.0)
MCH: 29.8 pg (ref 26.0–34.0)
MCHC: 32.6 g/dL (ref 30.0–36.0)
MCV: 91.5 fL (ref 80.0–100.0)
Monocytes Absolute: 0.7 10*3/uL (ref 0.1–1.0)
Monocytes Relative: 9 %
Neutro Abs: 6.5 10*3/uL (ref 1.7–7.7)
Neutrophils Relative %: 86 %
Platelets: 177 10*3/uL (ref 150–400)
RBC: 4.36 MIL/uL (ref 3.87–5.11)
RDW: 18.8 % — ABNORMAL HIGH (ref 11.5–15.5)
WBC: 7.5 10*3/uL (ref 4.0–10.5)
nRBC: 0 % (ref 0.0–0.2)

## 2022-11-18 LAB — BASIC METABOLIC PANEL
Anion gap: 12 (ref 5–15)
BUN: 33 mg/dL — ABNORMAL HIGH (ref 8–23)
CO2: 24 mmol/L (ref 22–32)
Calcium: 8.8 mg/dL — ABNORMAL LOW (ref 8.9–10.3)
Chloride: 101 mmol/L (ref 98–111)
Creatinine, Ser: 1.38 mg/dL — ABNORMAL HIGH (ref 0.44–1.00)
GFR, Estimated: 43 mL/min — ABNORMAL LOW (ref >60)
Glucose, Bld: 180 mg/dL — ABNORMAL HIGH (ref 70–99)
Potassium: 3.7 mmol/L (ref 3.5–5.1)
Sodium: 137 mmol/L (ref 135–145)

## 2022-11-18 LAB — C-REACTIVE PROTEIN: CRP: 1.8 mg/dL — ABNORMAL HIGH (ref <1.0)

## 2022-11-18 LAB — MAGNESIUM: Magnesium: 1.9 mg/dL (ref 1.7–2.4)

## 2022-11-18 LAB — DIGOXIN LEVEL: Digoxin Level: 0.7 ng/mL — ABNORMAL LOW (ref 0.8–2.0)

## 2022-11-18 LAB — BRAIN NATRIURETIC PEPTIDE: B Natriuretic Peptide: 1265.8 pg/mL — ABNORMAL HIGH (ref 0.0–100.0)

## 2022-11-18 MED ORDER — DOCUSATE SODIUM 100 MG PO CAPS
200.0000 mg | ORAL_CAPSULE | Freq: Two times a day (BID) | ORAL | Status: DC
  Administered 2022-11-18: 200 mg via ORAL
  Filled 2022-11-18 (×2): qty 2

## 2022-11-18 MED ORDER — POLYETHYLENE GLYCOL 3350 17 G PO PACK
17.0000 g | PACK | Freq: Two times a day (BID) | ORAL | Status: DC
  Administered 2022-11-18: 17 g via ORAL
  Filled 2022-11-18 (×2): qty 1

## 2022-11-18 MED ORDER — DIAZEPAM 2 MG PO TABS
2.0000 mg | ORAL_TABLET | Freq: Once | ORAL | Status: AC
  Administered 2022-11-18: 2 mg via ORAL
  Filled 2022-11-18: qty 1

## 2022-11-18 MED ORDER — MECLIZINE HCL 25 MG PO TABS
25.0000 mg | ORAL_TABLET | Freq: Three times a day (TID) | ORAL | Status: AC
  Administered 2022-11-18 (×3): 25 mg via ORAL
  Filled 2022-11-18 (×3): qty 1

## 2022-11-18 MED ORDER — FUROSEMIDE 10 MG/ML IJ SOLN
80.0000 mg | Freq: Two times a day (BID) | INTRAMUSCULAR | Status: DC
  Administered 2022-11-18: 80 mg via INTRAVENOUS

## 2022-11-18 MED ORDER — DIGOXIN 125 MCG PO TABS: 0.1250 mg | ORAL_TABLET | Freq: Every day | ORAL | Status: DC

## 2022-11-18 MED ORDER — FUROSEMIDE 10 MG/ML IJ SOLN
80.0000 mg | Freq: Two times a day (BID) | INTRAMUSCULAR | Status: DC
  Filled 2022-11-18: qty 8

## 2022-11-18 MED ORDER — DIGOXIN 125 MCG PO TABS
0.1250 mg | ORAL_TABLET | Freq: Every day | ORAL | Status: DC
  Administered 2022-11-18 – 2022-11-24 (×7): 0.125 mg via ORAL
  Filled 2022-11-18 (×7): qty 1

## 2022-11-18 MED ORDER — ONDANSETRON HCL 4 MG/2ML IJ SOLN
4.0000 mg | Freq: Four times a day (QID) | INTRAMUSCULAR | Status: DC | PRN
  Administered 2022-11-18 – 2022-11-19 (×3): 4 mg via INTRAVENOUS
  Filled 2022-11-18 (×3): qty 2

## 2022-11-18 NOTE — Progress Notes (Signed)
PROGRESS NOTE                                                                                                                                                                                                             Patient Demographics:    Brittany [name], is a 61 y.o. female, DOB - 1961-03-16, QGB:[id number]  Outpatient Primary MD for the patient is Traci Sermon, PA-C    LOS - 4  Admit date - 11/14/2022    Chief Complaint  Patient presents with   Shortness of Breath       Brief Narrative (HPI from H&P)    61 y.o. 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  She was diagnosed with acute influenza pneumonia along with CHF exacerbation causing acute hypoxic respiratory failure, she was placed on BiPAP and admitted to the hospital.   Subjective:   Patient in bed, appears comfortable, denies any headache, no fever, no chest pain or pressure, mild nausea, improved but still has shortness of breath , no abdominal pain. No new focal weakness.   Assessment  & Plan :   Sepsis secondary to influenza A with pneumonia, possibility of superimposed bacterial pneumonia as well with elevated procalcitonin and CRP, infection instigating acute on chronic diastolic CHF and acute on chronic hypoxic respiratory failure.  Patient's sepsis pathophysiology is improving she has been placed on Tamiflu for influenza A infection along with appropriate IV antibiotic combination of doxycycline and Rocephin, continue to monitor, now on PRN BiPAP for oxygen supplementation continue IV Lasix for diuresis, improving.  Note there is  element of anxiety in her symptoms of hypoxia as well.  Added Ativan on 11/16/2022 with good benefit.  Note she has persistently expressed her wishes to be DNR and does not want to be intubated if she gets worse.   Acute on chronic respiratory failure with hypoxia COPD with exacerbation - her wheezing has improved significantly, continue IV steroids at a much lower dose, continue treatment as above with supplemental oxygen and nebulizer treatments.  He has been requested to  sit in chair in the daytime use I-S and flutter valve for pulmonary toiletry.   Acute on chronic systolic heart failure EF 20% with AICD.  CHF team on board, continue diuresis with IV Lasix, on digoxin and Aldactone which will be continued, defer addition of beta-blocker to the cardiology team.  To use ACE/ARB due to underlying CKD.  Elevated troponin CAD - High-sensitivity troponin 152-> 294.  This is demand ischemia from hypoxia caused by problem stated above, troponin trend is flat and in non-ACS pattern, she is chest pain-free.  Cardiology team on board.  Continue statin for secondary prevention.  PAF on chronic anticoagulation - CHA2DS2-VASc score equal to at least 4.  On Eliquis and digoxin. Continue.   CKD stage IIIa - Stable   History of squamous cell lung cancer - Patient with prior treatment chemotherapy, chest radiation, and prophylactic brain radiation in 2015.   Hypokalemia.  Replaced.  Mild Nausea - stable exam, stable KUB, digoxin level stable.  Monitor with supportive care.       Condition - Extremely Guarded  Family Communication  :    Sister Olin Hauser [phone] - 11/16/22 message left at 12:27 PM  Berline Lopes [phone]  - 11/16/22  Code Status :  Full  Consults  :  Cards, PCCM  PUD Prophylaxis :    Procedures  :           Disposition Plan  :    Status is: Inpatient   DVT Prophylaxis  :    apixaban (ELIQUIS) tablet 2.5 mg Start: 11/14/22 1545 apixaban (ELIQUIS) tablet 2.5 mg      Lab Results  Component Value Date   PLT 177 11/18/2022    Diet :  Diet Order             DIET SOFT Fluid consistency: Thin; Fluid restriction: 1500 mL Fluid  Diet effective now                    Inpatient Medications  Scheduled Meds:  apixaban  2.5 mg Oral BID   atorvastatin  80 mg Oral Daily   dapagliflozin propanediol  10 mg Oral Daily   digoxin  0.125 mg Oral Daily   furosemide  80 mg Intravenous BID   guaiFENesin  600 mg Oral BID   ivabradine  7.5 mg Oral BID WC   methylPREDNISolone (SOLU-MEDROL) injection  40 mg Intravenous Daily   oseltamivir  30 mg Oral BID   sodium chloride flush  3 mL Intravenous Q12H   spironolactone  25 mg Oral Daily   Continuous Infusions:  cefTRIAXone (ROCEPHIN)  IV Stopped (11/16/22 2300)   doxycycline (VIBRAMYCIN) IV Stopped (11/18/22 0200)   PRN Meds:.acetaminophen **OR** acetaminophen, albuterol, benzonatate, LORazepam, naphazoline-pheniramine, nitroGLYCERIN, ondansetron (ZOFRAN) IV    Objective:   Vitals:   11/17/22 1930 11/17/22 2320 11/18/22 0500 11/18/22 0749  BP: 111/62 131/62 (!) 121/58 128/66  Pulse: 76 67 79 83  Resp: 19 16 18 17   Temp: 98.4 F (36.9 C) 97.7 F (36.5 C) 98.9 F (37.2 C) 98.3 F (36.8 C)  TempSrc: Oral Axillary Oral Oral  SpO2: 100% 100% 100% 98%  Weight:   48.8 kg   Height:        Wt Readings from Last 3 Encounters:  11/18/22 48.8 kg  11/08/22 49.5 kg  08/09/22 48 kg     Intake/Output Summary (Last 24 hours) at 11/18/2022 1010 Last data filed at 11/18/2022 0851 Gross per 24 hour  Intake 1220 ml  Output 1550 ml  Net -330 ml     Physical Exam  Awake Alert, No new F.N deficits, Normal affect [place].AT,PERRAL Supple Neck, No JVD,   Symmetrical Chest wall movement, Good air movement bilaterally, ++ rales RRR,No Gallops, Rubs or new Murmurs,  +ve B.Sounds, Abd Soft, No tenderness,   Trace edema     Data Review:    Recent Labs  Lab 11/14/22 0757 11/14/22 0835 11/15/22 0122  11/15/22 0206 11/16/22 0139 11/17/22 0112 11/18/22 0031  WBC 13.3*  --  10.1  --  9.9 5.8 7.5  HGB 13.3   < > 12.6 14.3 12.6 12.3 13.0  HCT 40.9   < > 41.0 42.0 39.3 38.5 39.9  PLT 172  --  148*  --  162 132* 177  MCV 93.2  --  95.8  --  91.4 92.3 91.5  MCH 30.3  --  29.4  --  29.3 29.5 29.8  MCHC 32.5  --  30.7  --  32.1 31.9 32.6  RDW 19.6*  --  19.6*  --  19.3* 19.4* 18.8*  LYMPHSABS 0.5*  --  0.3*  --  0.4* 0.3* 0.3*  MONOABS 0.8  --  0.5  --  0.5 0.3 0.7  EOSABS 0.0  --  0.0  --  0.0 0.0 0.0  BASOSABS 0.0  --  0.0  --  0.0 0.0 0.0   < > = values in this interval not displayed.    Recent Labs  Lab 11/14/22 0757 11/14/22 0835 11/14/22 1009 11/14/22 1355 11/15/22 0122 11/15/22 0206 11/16/22 0139 11/17/22 0112 11/18/22 0031  NA 140   < >  --   --  141 140 139 137 137  K 3.7   < >  --   --  4.0 4.1 3.4* 4.2 3.7  CL 104  --   --   --  98  --  102 102 101  CO2 23  --   --   --  26  --  26 25 24   GLUCOSE 184*  --   --   --  153*  --  119* 178* 180*  BUN 22  --   --   --  28*  --  28* 30* 33*  CREATININE 1.28*  --   --   --  1.51*  --  1.18* 1.26* 1.38*  AST  --   --   --   --  64*  --   --   --   --   ALT  --   --   --   --  44  --   --   --   --   ALKPHOS  --   --   --   --  76  --   --   --   --   BILITOT  --   --   --   --  0.9  --   --   --   --   ALBUMIN  --   --   --   --  3.7  --   --   --   --   CRP  --   --   --   --  13.2*  --  9.5* 4.5* 1.8*  PROCALCITON  --   --   --  8.49 12.90  --  6.85  --   --   LATICACIDVEN 2.7*  --  1.7  --   --   --   --   --   --  BNP 3,393.9*  --   --   --  3,188.1*  --  1,702.3* 893.3* 1,265.8*  MG  --   --   --   --  2.3  --  2.3 2.1 1.9  CALCIUM 9.5  --   --   --  9.7  --  9.3 8.9 8.8*   < > = values in this interval not displayed.      Recent Labs  Lab 11/14/22 0757 11/14/22 1009 11/14/22 1355 11/15/22 0122 11/16/22 0139 11/17/22 0112 11/18/22 0031  CRP  --   --   --  13.2* 9.5* 4.5* 1.8*  PROCALCITON  --   --   8.49 12.90 6.85  --   --   LATICACIDVEN 2.7* 1.7  --   --   --   --   --   BNP 3,393.9*  --   --  3,188.1* 1,702.3* 893.3* 1,265.8*  MG  --   --   --  2.3 2.3 2.1 1.9  CALCIUM 9.5  --   --  9.7 9.3 8.9 8.8*    Radiology Reports DG Abd Portable 1V  Result Date: 11/18/2022 CLINICAL DATA:  Nausea vomiting EXAM: PORTABLE ABDOMEN - 1 VIEW COMPARISON:  None Available. FINDINGS: The bowel gas pattern is normal. No radio-opaque calculi or other significant radiographic abnormality are seen. IMPRESSION: Negative. Electronically Signed   By: Sammie Bench M.D.   On: 11/18/2022 08:36   DG Chest Port 1 View  Result Date: 11/17/2022 CLINICAL DATA:  Shortness of breath. EXAM: PORTABLE CHEST 1 VIEW COMPARISON:  11/16/2022 FINDINGS: 0644 hours. The cardio pericardial silhouette is enlarged. Left-sided AICD again noted. Battery pack for right cervical stimulator device evident. There is pulmonary vascular congestion without overt pulmonary edema. Underlying diffuse interstitial opacity is similar to prior. Similar appearance subtle airspace disease in both lung bases. Pleural effusion. Telemetry leads overlie the chest. IMPRESSION: No substantial interval change in exam. Electronically Signed   By: Misty Stanley M.D.   On: 11/17/2022 07:07   DG Chest Port 1 View  Result Date: 11/16/2022 CLINICAL DATA:  Shortness of breath. EXAM: PORTABLE CHEST 1 VIEW COMPARISON:  November 15, 2022. FINDINGS: Stable cardiomegaly. Left-sided defibrillator is unchanged. Status post coronary bypass graft. Left lung is clear. Improved right basilar and midlung opacities are noted. The visualized skeletal structures are unremarkable. IMPRESSION: Improved right lung opacities. Electronically Signed   By: Marijo Conception M.D.   On: 11/16/2022 08:29   DG CHEST PORT 1 VIEW  Result Date: 11/15/2022 CLINICAL DATA:  Dyspnea, respiratory distress EXAM: PORTABLE CHEST 1 VIEW COMPARISON:  11/14/2022 FINDINGS: The lungs are symmetrically well  expanded. Patchy infiltrate is again seen within the right mid lung zone, possibly infectious or inflammatory in nature. Small right pleural effusion is again noted. No pneumothorax. No pleural effusion on the left. Coronary artery bypass grafting has been performed. Mild cardiomegaly is stable. Left subclavian pacemaker defibrillator is unchanged. Neurostimulator battery pack overlies the right hemithorax, unchanged. Pulmonary vascularity is normal. No acute bone abnormality. IMPRESSION: 1. Stable patchy infiltrate within the right mid lung zone, possibly infectious or inflammatory in nature. 2. Stable small right pleural effusion. Electronically Signed   By: Fidela Salisbury M.D.   On: 11/15/2022 01:30      Signature  -   Lala Lund M.D on 11/18/2022 at 10:10 AM   -  To page go to [email or web address]

## 2022-11-18 NOTE — Progress Notes (Signed)
Advanced Heart Failure Rounding Note  PCP-Cardiologist: Dr. [name]   Subjective:    On Ceftriaxone/doxycycline, steroids/nebs and Tamiflu.   Diuresed with IV lasix but weight up 105 -> 107  Feels poorly today. Very dizzy. + nausea and vomiting. Breathing slightly worse. KUB ok   Scr 1.26 -> 1.38  Objective:   Weight Range: 48.8 kg Body mass index is 21.71 kg/m.   Vital Signs:   Temp:  [97.7 F (36.5 C)-98.9 F (37.2 C)] 98.3 F (36.8 C) (12/09 0749) Pulse Rate:  [64-83] 83 (12/09 0749) Resp:  [16-19] 17 (12/09 0749) BP: (111-131)/(58-66) 128/66 (12/09 0749) SpO2:  [98 %-100 %] 98 % (12/09 0749) FiO2 (%):  [32 %] 32 % (12/08 1209) Weight:  [48.8 kg] 48.8 kg (12/09 0500) Last BM Date : 11/17/22  Weight change: Filed Weights   11/16/22 0044 11/17/22 0600 11/18/22 0500  Weight: 48.1 kg 47.8 kg 48.8 kg    Intake/Output:   Intake/Output Summary (Last 24 hours) at 11/18/2022 1007 Last data filed at 11/18/2022 0851 Gross per 24 hour  Intake 1220 ml  Output 1550 ml  Net -330 ml       Physical Exam    General:  Weak appearing. Audible coarse BS HEENT: normal Neck: supple. JVP 7-8. Carotids 2+ bilat; no bruits. No lymphadenopathy or thryomegaly appreciated. Cor: PMI nondisplaced. Regular rate & rhythm. No rubs, gallops or murmurs. Lungs: coarse/rhonchi  Abdomen: soft, nontender, nondistended. No hepatosplenomegaly. No bruits or masses. Good bowel sounds. Extremities: no cyanosis, clubbing, rash, edema Neuro: alert & orientedx3, cranial nerves grossly intact. moves all 4 extremities w/o difficulty. Affect pleasant   Telemetry   NSR 70-80s (Personally reviewed)    Labs    CBC Recent Labs    11/17/22 0112 11/18/22 0031  WBC 5.8 7.5  NEUTROABS 5.2 6.5  HGB 12.3 13.0  HCT 38.5 39.9  MCV 92.3 91.5  PLT 132* 062    Basic Metabolic Panel Recent Labs    11/17/22 0112 11/18/22 0031  NA 137 137  K 4.2 3.7  CL 102 101  CO2 25 24  GLUCOSE 178*  180*  BUN 30* 33*  CREATININE 1.26* 1.38*  CALCIUM 8.9 8.8*  MG 2.1 1.9    Liver Function Tests No results for input(s): "AST", "ALT", "ALKPHOS", "BILITOT", "PROT", "ALBUMIN" in the last 72 hours.  No results for input(s): "LIPASE", "AMYLASE" in the last 72 hours. Cardiac Enzymes No results for input(s): "CKTOTAL", "CKMB", "CKMBINDEX", "TROPONINI" in the last 72 hours.  BNP: BNP (last 3 results) Recent Labs    11/16/22 0139 11/17/22 0112 11/18/22 0031  BNP 1,702.3* 893.3* 1,265.8*     ProBNP (last 3 results) No results for input(s): "PROBNP" in the last 8760 hours.   D-Dimer No results for input(s): "DDIMER" in the last 72 hours. Hemoglobin A1C No results for input(s): "HGBA1C" in the last 72 hours. Fasting Lipid Panel No results for input(s): "CHOL", "HDL", "LDLCALC", "TRIG", "CHOLHDL", "LDLDIRECT" in the last 72 hours. Thyroid Function Tests No results for input(s): "TSH", "T4TOTAL", "T3FREE", "THYROIDAB" in the last 72 hours.  Invalid input(s): "FREET3"  Other results:   Imaging    DG Abd Portable 1V  Result Date: 11/18/2022 CLINICAL DATA:  Nausea vomiting EXAM: PORTABLE ABDOMEN - 1 VIEW COMPARISON:  None Available. FINDINGS: The bowel gas pattern is normal. No radio-opaque calculi or other significant radiographic abnormality are seen. IMPRESSION: Negative. Electronically Signed   By: Sammie Bench M.D.   On: 11/18/2022 08:36  Medications:     Scheduled Medications:  apixaban  2.5 mg Oral BID   atorvastatin  80 mg Oral Daily   dapagliflozin propanediol  10 mg Oral Daily   digoxin  0.125 mg Oral Daily   guaiFENesin  600 mg Oral BID   ivabradine  7.5 mg Oral BID WC   methylPREDNISolone (SOLU-MEDROL) injection  40 mg Intravenous Daily   oseltamivir  30 mg Oral BID   sodium chloride flush  3 mL Intravenous Q12H   spironolactone  25 mg Oral Daily    Infusions:  cefTRIAXone (ROCEPHIN)  IV Stopped (11/16/22 2300)   doxycycline (VIBRAMYCIN) IV  Stopped (11/18/22 0200)    PRN Medications: acetaminophen **OR** acetaminophen, albuterol, benzonatate, LORazepam, naphazoline-pheniramine, nitroGLYCERIN, ondansetron (ZOFRAN) IV    Patient Profile   Destane Speas is a 61 y.o.Mar[place] Kitchen female with a history of PAF,  previous smoker quit 2015  years ago, hypertension, 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 Presented to the ED with worsening SOB, multifactorial from COPDE, CHF and found to be influenza +. AHF team asked to see for acute on chronic systolic heart failure.   Assessment/Plan   Acute hypoxic respiratory failure due to COPD w/ exacerbation, +influenza - tested + for influenza, WBC 13.3 on admit, now on abx and tamiflu - Now off Bipap.  - Back to baseline   2. Acute on chronic Systolic Heart Failure Due to ICM  - Has [employer] single chamber ICD  - Echo 07/10/22: EF 20-25%, global hypokinesis, GIIDD, RV systolic function, mildly reduced. RV size normal. Mild MR, trivial TR, aortic valve w/ mild-mod regurg.  - NYHA class IV, BNP >3000 (893 today) - Volume overloaded on admit, suspect 2/2 influenza w/ COPD exacerbation - Diuresed on IV lasix but weight and BNP still up. Received 80 IV lasix this am.  - Continue digoxin 0.125mg  daily - Continue Farxiga 10 mg daily.  - Continue spiro 25 mg daily. - Continue Corlanor 7.5 mg bid - Failed Entresto due to hyperkalemia.   - No b-blocker due to intolerance with low output and bad COPD with frequent flares - Not a candidate for advanced therapies with her degree of lung disease.   - S/p barostim - Strict I&O, daily weights   3. CAD  - Hx of NSTEMI/STEMI s/p Emergent CABG 02/03/18: Cath with severe 2v CAD as above with severe LV dysfunction/ICM.  - s/p Emergent CABG 02/03/18.   - Had NSTEMI and now s/p LHC 02/12/19 with DES to the ostial ramus extending into the distal left  main.  - No longer on brillinta or ASA due to bleeding and need for [facility]. - Chest pain 6/10, persistent for the last 5 days (around the same time cough started) Doubt ischemic. HsTrop [phone].  EKG non acute  -> no further ischemic w/u indicated - Continue high-intensity statin.  4. Dizziness - suspect vertigo - d/w hospitalist. Plan meclizine/valium   5. Chronic Hypoxic Respiratory Failure  - Multifactorial, former smoker, h/o small cell lung cancer treated with chemo, chest XRT, COPD and HF - She has home O2 ([employer]). - Treating as above   6. Recurrent pleural effusions s/p pleurodesis - Tiny effusion R base, not seen on repeat CXR today   7. PAF - CHA2DS2/VASc is at least 4. (CHF, Vasc disease, HTN, Female).   - She has history of Afib RVR in the past in the setting  of her Lung CA.  - Previously on Xarelto but stopped due to hemoptysis.  - Remains in NSR  - Started on Eliquis in 12/22 due to renal infarct. - Continue Eliquis 2.5 mg bid. No bleeding issues.   8. 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 - Remains on Eliquis.     8. H/o SCLC:  - s/p treatment 2015. Lost to f/u since 04/2015.  - Chest CT 02/19/18 with mass-like consolidation in R hilum concerning for recurrent tumor.  - Repeat CT 03/27/2018 -No definite findings of locally recurrent tumor in the right lung. 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 Continued chest CT surveillance is advised in 3-6 months. - S/p thoracentesis 04/2018. Cytology negative for malignancy.  - CTA 11/19 stable scarring in the right hilum consistent with emphysema. Bronchoscopy on 11/15, no obvious source of bleeding    Length of Stay: 4  Glori Bickers, MD  11/18/2022, 10:07 AM  Advanced Heart Failure Team Pager [phone] (M-F; 7a - 5p)  Please contact [facility] for night-coverage after hours (5p -7a ) and weekends on  [email or web address]

## 2022-11-19 ENCOUNTER — Inpatient Hospital Stay (HOSPITAL_COMMUNITY): Payer: BC Managed Care – PPO

## 2022-11-19 DIAGNOSIS — I5022 Chronic systolic (congestive) heart failure: Secondary | ICD-10-CM | POA: Diagnosis not present

## 2022-11-19 DIAGNOSIS — J09X1 Influenza due to identified novel influenza A virus with pneumonia: Secondary | ICD-10-CM | POA: Diagnosis not present

## 2022-11-19 DIAGNOSIS — R0600 Dyspnea, unspecified: Secondary | ICD-10-CM | POA: Diagnosis not present

## 2022-11-19 DIAGNOSIS — J9621 Acute and chronic respiratory failure with hypoxia: Secondary | ICD-10-CM | POA: Diagnosis not present

## 2022-11-19 DIAGNOSIS — J441 Chronic obstructive pulmonary disease with (acute) exacerbation: Secondary | ICD-10-CM | POA: Diagnosis not present

## 2022-11-19 LAB — CBC WITH DIFFERENTIAL/PLATELET
Abs Immature Granulocytes: 0.04 10*3/uL (ref 0.00–0.07)
Basophils Absolute: 0 10*3/uL (ref 0.0–0.1)
Basophils Relative: 0 %
Eosinophils Absolute: 0 10*3/uL (ref 0.0–0.5)
Eosinophils Relative: 0 %
HCT: 40.7 % (ref 36.0–46.0)
Hemoglobin: 13.3 g/dL (ref 12.0–15.0)
Immature Granulocytes: 1 %
Lymphocytes Relative: 7 %
Lymphs Abs: 0.5 10*3/uL — ABNORMAL LOW (ref 0.7–4.0)
MCH: 29.6 pg (ref 26.0–34.0)
MCHC: 32.7 g/dL (ref 30.0–36.0)
MCV: 90.6 fL (ref 80.0–100.0)
Monocytes Absolute: 0.5 10*3/uL (ref 0.1–1.0)
Monocytes Relative: 7 %
Neutro Abs: 5.8 10*3/uL (ref 1.7–7.7)
Neutrophils Relative %: 85 %
Platelets: 173 10*3/uL (ref 150–400)
RBC: 4.49 MIL/uL (ref 3.87–5.11)
RDW: 18.9 % — ABNORMAL HIGH (ref 11.5–15.5)
WBC: 6.8 10*3/uL (ref 4.0–10.5)
nRBC: 0 % (ref 0.0–0.2)

## 2022-11-19 LAB — BASIC METABOLIC PANEL
Anion gap: 11 (ref 5–15)
BUN: 25 mg/dL — ABNORMAL HIGH (ref 8–23)
CO2: 26 mmol/L (ref 22–32)
Calcium: 9.1 mg/dL (ref 8.9–10.3)
Chloride: 102 mmol/L (ref 98–111)
Creatinine, Ser: 1.08 mg/dL — ABNORMAL HIGH (ref 0.44–1.00)
GFR, Estimated: 58 mL/min — ABNORMAL LOW (ref >60)
Glucose, Bld: 191 mg/dL — ABNORMAL HIGH (ref 70–99)
Potassium: 4.8 mmol/L (ref 3.5–5.1)
Sodium: 139 mmol/L (ref 135–145)

## 2022-11-19 LAB — CULTURE, BLOOD (ROUTINE X 2)
Culture: NO GROWTH
Culture: NO GROWTH
Special Requests: ADEQUATE
Special Requests: ADEQUATE

## 2022-11-19 LAB — BRAIN NATRIURETIC PEPTIDE: B Natriuretic Peptide: 1554.9 pg/mL — ABNORMAL HIGH (ref 0.0–100.0)

## 2022-11-19 LAB — C-REACTIVE PROTEIN: CRP: 3.1 mg/dL — ABNORMAL HIGH (ref <1.0)

## 2022-11-19 LAB — MAGNESIUM: Magnesium: 1.9 mg/dL (ref 1.7–2.4)

## 2022-11-19 MED ORDER — METHYLPREDNISOLONE SODIUM SUCC 40 MG IJ SOLR
20.0000 mg | Freq: Every day | INTRAMUSCULAR | Status: DC
  Administered 2022-11-19: 20 mg via INTRAVENOUS
  Filled 2022-11-19: qty 1

## 2022-11-19 MED ORDER — MECLIZINE HCL 25 MG PO TABS
25.0000 mg | ORAL_TABLET | Freq: Two times a day (BID) | ORAL | Status: AC
  Administered 2022-11-19 (×2): 25 mg via ORAL
  Filled 2022-11-19 (×3): qty 1

## 2022-11-19 MED ORDER — MILRINONE LACTATE IN DEXTROSE 20-5 MG/100ML-% IV SOLN
0.1250 ug/kg/min | INTRAVENOUS | Status: DC
  Administered 2022-11-19 – 2022-11-21 (×2): 0.125 ug/kg/min via INTRAVENOUS
  Filled 2022-11-19 (×3): qty 100

## 2022-11-19 MED ORDER — APIXABAN 5 MG PO TABS
5.0000 mg | ORAL_TABLET | Freq: Two times a day (BID) | ORAL | Status: DC
  Administered 2022-11-19 – 2022-11-22 (×6): 5 mg via ORAL
  Filled 2022-11-19 (×6): qty 1

## 2022-11-19 MED ORDER — LOPERAMIDE HCL 2 MG PO CAPS
4.0000 mg | ORAL_CAPSULE | Freq: Four times a day (QID) | ORAL | Status: DC | PRN
  Administered 2022-11-19: 4 mg via ORAL
  Filled 2022-11-19: qty 2

## 2022-11-19 MED ORDER — LORAZEPAM 1 MG PO TABS
1.0000 mg | ORAL_TABLET | Freq: Every day | ORAL | Status: DC
  Administered 2022-11-19 – 2022-11-24 (×5): 1 mg via ORAL
  Filled 2022-11-19 (×5): qty 1

## 2022-11-19 NOTE — Progress Notes (Addendum)
PROGRESS NOTE                                                                                                                                                                                                             Patient Demographics:    Brittany [name], is a 61 y.o. female, DOB - 04-24-1961, MVV:[id number]  Outpatient Primary MD for the patient is Traci Sermon, PA-C    LOS - 5  Admit date - 11/14/2022    Chief Complaint  Patient presents with   Shortness of Breath       Brief Narrative (HPI from H&P)    61 y.o. 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  She was diagnosed with acute influenza pneumonia along with CHF exacerbation causing acute hypoxic respiratory failure, she was placed on BiPAP and admitted to the hospital.   Subjective:   Patient in bed, appears comfortable, denies any headache, no fever, no chest pain or pressure, mild nausea, improved but still has shortness of breath , no abdominal pain. No new focal weakness.   Assessment  & Plan :   Sepsis secondary to influenza A with pneumonia, possibility of superimposed bacterial pneumonia as well with elevated procalcitonin and CRP, infection instigating acute on chronic diastolic CHF and acute on chronic hypoxic respiratory failure.  Patient's sepsis pathophysiology is improving she has been placed on Tamiflu for influenza A infection along with appropriate IV antibiotic combination of doxycycline and Rocephin, continue to monitor, now on PRN BiPAP for oxygen supplementation continue IV Lasix for diuresis, improving.  Note there is  element of anxiety in her symptoms of hypoxia as well.  Added Ativan on 11/16/2022 with good benefit.  Note she has persistently expressed her wishes to be DNR and does not want to be intubated if she gets worse.   Acute on chronic respiratory failure with hypoxia COPD with exacerbation - her wheezing has improved significantly, continue IV steroids at a much lower dose, continue treatment as above with supplemental oxygen and nebulizer treatments.  He has been requested to  sit in chair in the daytime use I-S and flutter valve for pulmonary toiletry.   Acute on chronic systolic heart failure EF 20% with AICD.  CHF team on board, continue diuresis with IV Lasix ( per CHF team), on digoxin and Aldactone which will be continued, defer addition of beta-blocker to the cardiology team.  To use ACE/ARB due to underlying CKD.  Elevated troponin, CAD - High-sensitivity troponin 152-> 294.  This is demand ischemia from hypoxia caused by problem stated above, troponin trend is flat and in non-ACS pattern, she is chest pain-free.  Cardiology team on board.  Continue statin for secondary prevention.  PAF on chronic anticoagulation - CHA2DS2-VASc score equal to at least 4.  On Eliquis and digoxin. Continue.   CKD stage IIIa - Stable   History of squamous cell lung cancer - Patient with prior treatment chemotherapy, chest radiation, and prophylactic brain radiation in 2015.   Hypokalemia.  Replaced.  Mild Nausea & some dizziness - stable exam, stable KUB, digoxin level stable, ? BPPV added Midodrene + Benzo, DC ABX ? Side effect, monitor       Condition - Extremely Guarded  Family Communication  :    Tyler Aas [phone] - 11/16/22 message left at 12:27 PM  Berline Lopes [phone]  - 11/16/22  Code Status :  Full  Consults  :  Cards, PCCM  PUD Prophylaxis :    Procedures  :           Disposition Plan  :    Status is: Inpatient   DVT Prophylaxis  :    Place TED hose Start:  11/19/22 0738 apixaban (ELIQUIS) tablet 2.5 mg Start: 11/14/22 1545 apixaban (ELIQUIS) tablet 2.5 mg     Lab Results  Component Value Date   PLT 173 11/19/2022    Diet :  Diet Order             DIET SOFT Fluid consistency: Thin; Fluid restriction: 1500 mL Fluid  Diet effective now                    Inpatient Medications  Scheduled Meds:  apixaban  2.5 mg Oral BID   atorvastatin  80 mg Oral Daily   dapagliflozin propanediol  10 mg Oral Daily   digoxin  0.125 mg Oral Daily   guaiFENesin  600 mg Oral BID   ivabradine  7.5 mg Oral BID WC   LORazepam  1 mg Oral Q0600   meclizine  25 mg Oral BID   methylPREDNISolone (SOLU-MEDROL) injection  20 mg Intravenous Daily   sodium chloride flush  3 mL Intravenous Q12H   spironolactone  25 mg Oral Daily   Continuous Infusions:   PRN Meds:.acetaminophen **OR** acetaminophen, albuterol, loperamide, naphazoline-pheniramine, nitroGLYCERIN, ondansetron (ZOFRAN) IV    Objective:   Vitals:   11/18/22 2350 11/19/22 0044 11/19/22 0500 11/19/22 0538  BP:  (!) 132/59  135/69  Pulse: 69 70  88  Resp: 16 20  (!) 21  Temp:  98.2 F (36.8 C)  98.7 F (37.1 C)  TempSrc:  Oral Oral Oral  SpO2:  98%  93%  Weight:  52.4 kg    Height:        Wt Readings from Last 3 Encounters:  11/19/22 52.4 kg  11/08/22 49.5 kg  08/09/22 48 kg     Intake/Output Summary (Last 24 hours) at 11/19/2022 1003 Last data filed at 11/19/2022 0924 Gross per 24 hour  Intake 1340 ml  Output  1450 ml  Net -110 ml     Physical Exam  Awake Alert, No new F.N deficits, Normal affect [place].AT,PERRAL Supple Neck, No JVD,   Symmetrical Chest wall movement, Good air movement bilaterally, improved rales RRR,No Gallops, Rubs or new Murmurs,  +ve B.Sounds, Abd Soft, No tenderness,   No Cyanosis, Clubbing or edema       Data Review:    Recent Labs  Lab 11/15/22 0122 11/15/22 0206 11/16/22 0139 11/17/22 0112 11/18/22 0031 11/19/22 0049  WBC 10.1   --  9.9 5.8 7.5 6.8  HGB 12.6 14.3 12.6 12.3 13.0 13.3  HCT 41.0 42.0 39.3 38.5 39.9 40.7  PLT 148*  --  162 132* 177 173  MCV 95.8  --  91.4 92.3 91.5 90.6  MCH 29.4  --  29.3 29.5 29.8 29.6  MCHC 30.7  --  32.1 31.9 32.6 32.7  RDW 19.6*  --  19.3* 19.4* 18.8* 18.9*  LYMPHSABS 0.3*  --  0.4* 0.3* 0.3* 0.5*  MONOABS 0.5  --  0.5 0.3 0.7 0.5  EOSABS 0.0  --  0.0 0.0 0.0 0.0  BASOSABS 0.0  --  0.0 0.0 0.0 0.0    Recent Labs  Lab 11/14/22 0757 11/14/22 0835 11/14/22 1009 11/14/22 1355 11/15/22 0122 11/15/22 0206 11/16/22 0139 11/17/22 0112 11/18/22 0031 11/19/22 0049  NA 140   < >  --   --  141 140 139 137 137 139  K 3.7   < >  --   --  4.0 4.1 3.4* 4.2 3.7 4.8  CL 104  --   --   --  98  --  102 102 101 102  CO2 23  --   --   --  26  --  26 25 24 26   GLUCOSE 184*  --   --   --  153*  --  119* 178* 180* 191*  BUN 22  --   --   --  28*  --  28* 30* 33* 25*  CREATININE 1.28*  --   --   --  1.51*  --  1.18* 1.26* 1.38* 1.08*  AST  --   --   --   --  64*  --   --   --   --   --   ALT  --   --   --   --  44  --   --   --   --   --   ALKPHOS  --   --   --   --  76  --   --   --   --   --   BILITOT  --   --   --   --  0.9  --   --   --   --   --   ALBUMIN  --   --   --   --  3.7  --   --   --   --   --   CRP  --   --   --   --  13.2*  --  9.5* 4.5* 1.8* 3.1*  PROCALCITON  --   --   --  8.49 12.90  --  6.85  --   --   --   LATICACIDVEN 2.7*  --  1.7  --   --   --   --   --   --   --   BNP 3,393.9*  --   --   --  3,188.1*  --  1,702.3* 893.3* 1,265.8* 1,554.9*  MG  --   --   --   --  2.3  --  2.3 2.1 1.9 1.9  CALCIUM 9.5  --   --   --  9.7  --  9.3 8.9 8.8* 9.1   < > = values in this interval not displayed.      Radiology Reports DG Chest Port 1 View  Result Date: 11/19/2022 CLINICAL DATA:  Shortness of breath EXAM: PORTABLE CHEST 1 VIEW COMPARISON:  Two days ago FINDINGS: Cardiomegaly. Right perihilar indistinctness with scar-like appearance by CT in the setting of prior  radiotherapy. Vagal nerve stimulator on the right and single chamber defibrillator lead into the right ventricle on the left. Prior CABG. There is no edema, consolidation, effusion, or pneumothorax. IMPRESSION: Stable post treatment chest.  No evidence of acute disease. Electronically Signed   By: Jorje Guild M.D.   On: 11/19/2022 06:20   DG Abd Portable 1V  Result Date: 11/18/2022 CLINICAL DATA:  Nausea vomiting EXAM: PORTABLE ABDOMEN - 1 VIEW COMPARISON:  None Available. FINDINGS: The bowel gas pattern is normal. No radio-opaque calculi or other significant radiographic abnormality are seen. IMPRESSION: Negative. Electronically Signed   By: Sammie Bench M.D.   On: 11/18/2022 08:36   DG Chest Port 1 View  Result Date: 11/17/2022 CLINICAL DATA:  Shortness of breath. EXAM: PORTABLE CHEST 1 VIEW COMPARISON:  11/16/2022 FINDINGS: 0644 hours. The cardio pericardial silhouette is enlarged. Left-sided AICD again noted. Battery pack for right cervical stimulator device evident. There is pulmonary vascular congestion without overt pulmonary edema. Underlying diffuse interstitial opacity is similar to prior. Similar appearance subtle airspace disease in both lung bases. Pleural effusion. Telemetry leads overlie the chest. IMPRESSION: No substantial interval change in exam. Electronically Signed   By: Misty Stanley M.D.   On: 11/17/2022 07:07   DG Chest Port 1 View  Result Date: 11/16/2022 CLINICAL DATA:  Shortness of breath. EXAM: PORTABLE CHEST 1 VIEW COMPARISON:  November 15, 2022. FINDINGS: Stable cardiomegaly. Left-sided defibrillator is unchanged. Status post coronary bypass graft. Left lung is clear. Improved right basilar and midlung opacities are noted. The visualized skeletal structures are unremarkable. IMPRESSION: Improved right lung opacities. Electronically Signed   By: Marijo Conception M.D.   On: 11/16/2022 08:29      Signature  -   Lala Lund M.D on 11/19/2022 at 10:03 AM   -  To page  go to [email or web address]

## 2022-11-19 NOTE — Progress Notes (Signed)
RT NOTE: Pt refuses BIPAP tonight. Pt is tolerating [place] well. Order is PRN.

## 2022-11-19 NOTE — Progress Notes (Signed)
Advanced Heart Failure Rounding Note   PCP-Cardiologist: Dr. [name]    Subjective:     On Ceftriaxone/doxycycline, steroids/nebs and Tamiflu.    Diuresed with IV lasix but weight up 105 -> 107   Feels poorly today. Very dizzy. + nausea and vomiting. Breathing slightly worse. KUB ok    Scr 1.26 -> 1.38   Objective:   Weight Range: 48.8 kg Body mass index is 21.71 kg/m.    Vital Signs:                 Temp:  [97.7 F (36.5 C)-98.9 F (37.2 C)] 98.3 F (36.8 C) (12/09 0749) Pulse Rate:  [64-83] 83 (12/09 0749) Resp:  [16-19] 17 (12/09 0749) BP: (111-131)/(58-66) 128/66 (12/09 0749) SpO2:  [98 %-100 %] 98 % (12/09 0749) FiO2 (%):  [32 %] 32 % (12/08 1209) Weight:  [48.8 kg] 48.8 kg (12/09 0500) Last BM Date : 11/17/22   Weight change:      Filed Weights    11/16/22 0044 11/17/22 0600 11/18/22 0500  Weight: 48.1 kg 47.8 kg 48.8 kg      Intake/Output:              Intake/Output Summary (Last 24 hours) at 11/18/2022 1007 Last data filed at 11/18/2022 0851    Gross per 24 hour  Intake 1220 ml  Output 1550 ml  Net -330 ml                     Physical Exam    General:  Weak appearing. Audible coarse BS HEENT: normal Neck: supple. JVP 7-8. Carotids 2+ bilat; no bruits. No lymphadenopathy or thryomegaly appreciated. Cor: PMI nondisplaced. Regular rate & rhythm. No rubs, gallops or murmurs. Lungs: coarse/rhonchi  Abdomen: soft, nontender, nondistended. No hepatosplenomegaly. No bruits or masses. Good bowel sounds. Extremities: no cyanosis, clubbing, rash, edema Neuro: alert & orientedx3, cranial nerves grossly intact. moves all 4 extremities w/o difficulty. Affect pleasant     Telemetry    NSR 70-80s (Personally reviewed)     Labs    CBC Recent Labs (last 2 labs)      Recent Labs    11/17/22 0112 11/18/22 0031  WBC 5.8 7.5  NEUTROABS 5.2 6.5  HGB 12.3 13.0  HCT 38.5 39.9  MCV 92.3 91.5  PLT 132* 177       Basic Metabolic Panel Recent  Labs (last 2 labs)      Recent Labs    11/17/22 0112 11/18/22 0031  NA 137 137  K 4.2 3.7  CL 102 101  CO2 25 24  GLUCOSE 178* 180*  BUN 30* 33*  CREATININE 1.26* 1.38*  CALCIUM 8.9 8.8*  MG 2.1 1.9       Liver Function Tests  Recent Labs (last 2 labs)  No results for input(s): "AST", "ALT", "ALKPHOS", "BILITOT", "PROT", "ALBUMIN" in the last 72 hours.    Recent Labs (last 2 labs)  No results for input(s): "LIPASE", "AMYLASE" in the last 72 hours.   Cardiac Enzymes Recent Labs (last 2 labs)  No results for input(s): "CKTOTAL", "CKMB", "CKMBINDEX", "TROPONINI" in the last 72 hours.     BNP: BNP (last 3 results) Recent Labs (within last 365 days)       Recent Labs    11/16/22 0139 11/17/22 0112 11/18/22 0031  BNP 1,702.3* 893.3* 1,265.8*         ProBNP (last 3 results) Recent Labs (within last 365 days)  No  results for input(s): "PROBNP" in the last 8760 hours.       D-Dimer Recent Labs (last 2 labs)  No results for input(s): "DDIMER" in the last 72 hours.   Hemoglobin A1C Recent Labs (last 2 labs)  No results for input(s): "HGBA1C" in the last 72 hours.   Fasting Lipid Panel Recent Labs (last 2 labs)  No results for input(s): "CHOL", "HDL", "LDLCALC", "TRIG", "CHOLHDL", "LDLDIRECT" in the last 72 hours.   Thyroid Function Tests  Recent Labs (last 2 labs)  No results for input(s): "TSH", "T4TOTAL", "T3FREE", "THYROIDAB" in the last 72 hours.   Invalid input(s): "FREET3"     Other results:     Imaging      DG Abd Portable 1V   Result Date: 11/18/2022 CLINICAL DATA:  Nausea vomiting EXAM: PORTABLE ABDOMEN - 1 VIEW COMPARISON:  None Available. FINDINGS: The bowel gas pattern is normal. No radio-opaque calculi or other significant radiographic abnormality are seen. IMPRESSION: Negative. Electronically Signed   By: Sammie Bench M.D.   On: 11/18/2022 08:36       Medications:       Scheduled Medications:  apixaban  2.5 mg Oral BID    atorvastatin  80 mg Oral Daily   dapagliflozin propanediol  10 mg Oral Daily   digoxin  0.125 mg Oral Daily   guaiFENesin  600 mg Oral BID   ivabradine  7.5 mg Oral BID WC   methylPREDNISolone (SOLU-MEDROL) injection  40 mg Intravenous Daily   oseltamivir  30 mg Oral BID   sodium chloride flush  3 mL Intravenous Q12H   spironolactone  25 mg Oral Daily      Infusions:  cefTRIAXone (ROCEPHIN)  IV Stopped (11/16/22 2300)   doxycycline (VIBRAMYCIN) IV Stopped (11/18/22 0200)      PRN Medications: acetaminophen **OR** acetaminophen, albuterol, benzonatate, LORazepam, naphazoline-pheniramine, nitroGLYCERIN, ondansetron (ZOFRAN) IV       Patient Profile    Brittany [name] is a 61 y.o.[place] female with a history of PAF,  previous smoker quit 2015  years ago, hypertension, previous small cell lung cancer treated with chemo, chest XRT and prophylactic brain radiation in 2015, CAD s/p CABG and chronic systolic HF EF ~93%. Presented to the ED with worsening SOB, multifactorial from COPDE, CHF and found to be influenza +. AHF team asked to see for acute on chronic systolic heart failure.    Assessment/Plan    Acute hypoxic respiratory failure due to COPD w/ exacerbation, +influenza - tested + for influenza, WBC 13.3 on admit, now on abx and tamiflu - Now off Bipap.  - Back to baseline   2. Acute on chronic Systolic Heart Failure Due to ICM  - Has [employer] single chamber ICD  - Echo 07/10/22: EF 20-25%, global hypokinesis, GIIDD, RV systolic function, mildly reduced. RV size normal. Mild MR, trivial TR, aortic valve w/ mild-mod regurg.  - NYHA class IV, BNP >3000 (893 today) - Volume overloaded on admit, suspect 2/2 influenza w/ COPD exacerbation - Diuresed on IV lasix but weight and BNP still up. Received 80 IV lasix this am.  - Continue digoxin 0.125mg  daily - Continue Farxiga 10 mg daily.  - Continue spiro 25 mg daily. - Continue Corlanor 7.5 mg bid - Failed Entresto due to  hyperkalemia.   - No b-blocker due to intolerance with low output and bad COPD with frequent flares - Not a candidate for advanced therapies with her degree of lung disease.   - S/p barostim - Strict I&O,  daily weights   3. CAD  - Hx of NSTEMI/STEMI s/p Emergent CABG 02/03/18: Cath with severe 2v CAD as above with severe LV dysfunction/ICM.  - s/p Emergent CABG 02/03/18.   - Had NSTEMI and now s/p LHC 02/12/19 with DES to the ostial ramus extending into the distal left main.  - No longer on brillinta or ASA due to bleeding and need for [facility]. - Chest pain 6/10, persistent for the last 5 days (around the same time cough started) Doubt ischemic. HsTrop [phone].  EKG non acute  -> no further ischemic w/u indicated - Continue high-intensity statin.   4. Dizziness - suspect vertigo - d/w hospitalist. Plan meclizine/valium   5. Chronic Hypoxic Respiratory Failure  - Multifactorial, former smoker, h/o small cell lung cancer treated with chemo, chest XRT, COPD and HF - She has home O2 ([employer]). - Treating as above   6. Recurrent pleural effusions s/p pleurodesis - Tiny effusion R base, not seen on repeat CXR today   7. PAF - CHA2DS2/VASc is at least 4. (CHF, Vasc disease, HTN, Female).   - She has history of Afib RVR in the past in the setting of her Lung CA.  - Previously on Xarelto but stopped due to hemoptysis.  - Remains in NSR  - Started on Eliquis in 12/22 due to renal infarct. - Continue Eliquis 2.5 mg bid. No bleeding issues.   8. 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 - Remains on Eliquis.     8. H/o SCLC:  - s/p treatment 2015. Lost to f/u since 04/2015.  - Chest CT 02/19/18 with mass-like consolidation in R hilum concerning for recurrent tumor.  - Repeat CT 03/27/2018 -No definite findings of locally recurrent tumor in the right lung. 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  Continued chest CT surveillance is advised in 3-6 months. - S/p thoracentesis 04/2018. Cytology negative for malignancy.  - CTA 11/19 stable scarring in the right hilum consistent with emphysema. Bronchoscopy on 11/15, no obvious source of bleeding     Length of Stay: 4   Glori Bickers, MD  11/18/2022, 10:07 AM   Advanced Heart Failure Team Pager [phone] (M-F; 7a - 5p)  Please contact [facility] for night-coverage after hours (5p -7a ) and weekends on [email or web address]

## 2022-11-20 ENCOUNTER — Inpatient Hospital Stay (HOSPITAL_COMMUNITY): Payer: BC Managed Care – PPO

## 2022-11-20 DIAGNOSIS — R0600 Dyspnea, unspecified: Secondary | ICD-10-CM | POA: Diagnosis not present

## 2022-11-20 DIAGNOSIS — J441 Chronic obstructive pulmonary disease with (acute) exacerbation: Secondary | ICD-10-CM | POA: Diagnosis not present

## 2022-11-20 DIAGNOSIS — J9621 Acute and chronic respiratory failure with hypoxia: Secondary | ICD-10-CM | POA: Diagnosis not present

## 2022-11-20 DIAGNOSIS — I5022 Chronic systolic (congestive) heart failure: Secondary | ICD-10-CM | POA: Diagnosis not present

## 2022-11-20 LAB — BASIC METABOLIC PANEL
Anion gap: 12 (ref 5–15)
BUN: 21 mg/dL (ref 8–23)
CO2: 22 mmol/L (ref 22–32)
Calcium: 8.8 mg/dL — ABNORMAL LOW (ref 8.9–10.3)
Chloride: 102 mmol/L (ref 98–111)
Creatinine, Ser: 0.97 mg/dL (ref 0.44–1.00)
GFR, Estimated: >60 mL/min (ref >60)
Glucose, Bld: 101 mg/dL — ABNORMAL HIGH (ref 70–99)
Potassium: 4.4 mmol/L (ref 3.5–5.1)
Sodium: 136 mmol/L (ref 135–145)

## 2022-11-20 LAB — CBC WITH DIFFERENTIAL/PLATELET
Abs Immature Granulocytes: 0 10*3/uL (ref 0.00–0.07)
Basophils Absolute: 0 10*3/uL (ref 0.0–0.1)
Basophils Relative: 0 %
Eosinophils Absolute: 0 10*3/uL (ref 0.0–0.5)
Eosinophils Relative: 0 %
HCT: 44.3 % (ref 36.0–46.0)
Hemoglobin: 14 g/dL (ref 12.0–15.0)
Lymphocytes Relative: 4 %
Lymphs Abs: 0.3 10*3/uL — ABNORMAL LOW (ref 0.7–4.0)
MCH: 29.2 pg (ref 26.0–34.0)
MCHC: 31.6 g/dL (ref 30.0–36.0)
MCV: 92.3 fL (ref 80.0–100.0)
Monocytes Absolute: 0.4 10*3/uL (ref 0.1–1.0)
Monocytes Relative: 5 %
Neutro Abs: 7.1 10*3/uL (ref 1.7–7.7)
Neutrophils Relative %: 91 %
Platelets: 164 10*3/uL (ref 150–400)
RBC: 4.8 MIL/uL (ref 3.87–5.11)
RDW: 19.2 % — ABNORMAL HIGH (ref 11.5–15.5)
WBC: 7.8 10*3/uL (ref 4.0–10.5)
nRBC: 0 % (ref 0.0–0.2)
nRBC: 0 /100 WBC

## 2022-11-20 LAB — BRAIN NATRIURETIC PEPTIDE: B Natriuretic Peptide: 1081 pg/mL — ABNORMAL HIGH (ref 0.0–100.0)

## 2022-11-20 MED ORDER — FUROSEMIDE 10 MG/ML IJ SOLN
40.0000 mg | Freq: Once | INTRAMUSCULAR | Status: AC
  Administered 2022-11-20: 40 mg via INTRAVENOUS
  Filled 2022-11-20: qty 4

## 2022-11-20 NOTE — Progress Notes (Addendum)
Advanced Heart Failure Rounding Note  PCP-Cardiologist: Dr. [name]   Subjective:    12/10: Empiric milrinone started  Continues on milrinone 0.125.  Off IV lasix. Is/Os incomplete.  Weight charted down 7 lb overnight (mix of standing and bed weights since admit).   Nausea improving. No vomiting. Able to eat a little breakfast. Gets dizzy with turning head or attempting to get out of bed. Primary team added meclizine.   Objective:   Weight Range: 49.4 kg Body mass index is 22 kg/m.   Vital Signs:   Temp:  [98.2 F (36.8 C)-98.6 F (37 C)] 98.4 F (36.9 C) (12/11 0312) Pulse Rate:  [64-84] 65 (12/11 0312) Resp:  [16-20] 18 (12/11 0312) BP: (99-124)/(46-67) 100/46 (12/11 0312) SpO2:  [92 %-100 %] 98 % (12/11 0312) Weight:  [49.4 kg] 49.4 kg (12/11 0312) Last BM Date : 11/17/22  Weight change: Filed Weights   11/18/22 0500 11/19/22 0044 11/20/22 0312  Weight: 48.8 kg 52.4 kg 49.4 kg    Intake/Output:   Intake/Output Summary (Last 24 hours) at 11/20/2022 0729 Last data filed at 11/20/2022 0500 Gross per 24 hour  Intake 363.51 ml  Output 700 ml  Net -336.49 ml      Physical Exam    General:  Fatigued appearing HEENT: normal Neck: supple. JVP 6-7 cm. Carotids 2+ bilat; no bruits.  Cor: PMI nondisplaced. Regular rate & rhythm. No rubs, gallops or murmurs. Lungs: coarse breath sounds Abdomen: soft, nontender, nondistended.  Extremities: no cyanosis, clubbing, rash, edema Neuro: alert & orientedx3, cranial nerves grossly intact. moves all 4 extremities w/o difficulty. Affect pleasant   Telemetry   SR 60s-70s, 2-3 PVCs/min (personally reviewed)  Labs    CBC Recent Labs    11/19/22 0049 11/20/22 0121  WBC 6.8 7.8  NEUTROABS 5.8 7.1  HGB 13.3 14.0  HCT 40.7 44.3  MCV 90.6 92.3  PLT 173 026   Basic Metabolic Panel Recent Labs    11/18/22 0031 11/19/22 0049 11/20/22 0121  NA 137 139 136  K 3.7 4.8 4.4  CL 101 102 102  CO2 24 26 22    GLUCOSE 180* 191* 101*  BUN 33* 25* 21  CREATININE 1.38* 1.08* 0.97  CALCIUM 8.8* 9.1 8.8*  MG 1.9 1.9  --    Liver Function Tests No results for input(s): "AST", "ALT", "ALKPHOS", "BILITOT", "PROT", "ALBUMIN" in the last 72 hours.  No results for input(s): "LIPASE", "AMYLASE" in the last 72 hours. Cardiac Enzymes No results for input(s): "CKTOTAL", "CKMB", "CKMBINDEX", "TROPONINI" in the last 72 hours.  BNP: BNP (last 3 results) Recent Labs    11/17/22 0112 11/18/22 0031 11/19/22 0049  BNP 893.3* 1,265.8* 1,554.9*    ProBNP (last 3 results) No results for input(s): "PROBNP" in the last 8760 hours.   D-Dimer No results for input(s): "DDIMER" in the last 72 hours. Hemoglobin A1C No results for input(s): "HGBA1C" in the last 72 hours. Fasting Lipid Panel No results for input(s): "CHOL", "HDL", "LDLCALC", "TRIG", "CHOLHDL", "LDLDIRECT" in the last 72 hours. Thyroid Function Tests No results for input(s): "TSH", "T4TOTAL", "T3FREE", "THYROIDAB" in the last 72 hours.  Invalid input(s): "FREET3"  Other results:   Imaging    No results found.   Medications:     Scheduled Medications:  apixaban  5 mg Oral BID   atorvastatin  80 mg Oral Daily   dapagliflozin propanediol  10 mg Oral Daily   digoxin  0.125 mg Oral Daily   guaiFENesin  600 mg  Oral BID   ivabradine  7.5 mg Oral BID WC   LORazepam  1 mg Oral Q0600   meclizine  25 mg Oral BID   sodium chloride flush  3 mL Intravenous Q12H   spironolactone  25 mg Oral Daily    Infusions:  milrinone 0.125 mcg/kg/min (11/19/22 1701)    PRN Medications: acetaminophen **OR** acetaminophen, albuterol, loperamide, naphazoline-pheniramine, nitroGLYCERIN, ondansetron (ZOFRAN) IV    Patient Profile   Elizbeth Posa is a 61 y.o.[place] Kitchen female with a history of PAF,  previous smoker quit 2015  years ago, hypertension, 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 Presented to the ED with worsening SOB, multifactorial from COPDE, CHF and found to be influenza +. AHF team asked to see for acute on chronic systolic heart failure.   Assessment/Plan   Acute hypoxic respiratory failure due to COPD w/ exacerbation, +influenza - tested + for influenza, WBC 13.3 on admit, completed abx and tamiflu -> WBC 7.8 - Now off Bipap.  - Lung exam still coarse   2. Acute on chronic Systolic Heart Failure Due to ICM  - Has [employer] single chamber ICD  - Echo 07/10/22: EF 20-25%, global hypokinesis, GIIDD, RV systolic function, mildly reduced. RV size normal. Mild MR, trivial TR, aortic valve w/ mild-mod regurg.  - NYHA class IV - Volume overloaded on admit. Diuresed with IV lasix - Empiric milrinone added 12/10 d/t concern for low-output - Volume looks okay on exam. Did not add po diuretic today d/t poor po intake. - Continue digoxin 0.125mg  daily, dig level okay 12/09 - Continue Farxiga 10 mg daily.  - Continue spiro 25 mg daily. - Continue Corlanor 7.5 mg bid - Failed Entresto due to hyperkalemia.   - No b-blocker due to intolerance with low output and bad COPD with frequent flares - Not a candidate for advanced therapies with her degree of lung disease.   - S/p barostim  3. CAD  - Hx of NSTEMI/STEMI s/p Emergent CABG 02/03/18: Cath with severe 2v CAD as above with severe LV dysfunction/ICM.  - s/p Emergent CABG 02/03/18.   - Had NSTEMI and now s/p LHC 02/12/19 with DES to the ostial ramus extending into the distal left main.  - No longer on brillinta or ASA due to bleeding and need for [facility]. - Chest pain 6/10, persistent for the last 5 days (around the same time cough started) Doubt ischemic. HsTrop [phone].  EKG non acute  -> no further ischemic w/u indicated - Continue high-intensity statin. - No s/s angina  4. Dizziness - suspect vertigo - d/w hospitalist. Improved. Meclizine added - PT  reconsulted   5. Chronic Hypoxic Respiratory Failure  - Multifactorial, former smoker, h/o small cell lung cancer treated with chemo, chest XRT, COPD and HF - She has home O2 ([employer]). - Treating as above   6. Recurrent pleural effusions s/p pleurodesis - Tiny effusion R base, not seen on repeat CXR today   7. PAF - CHA2DS2/VASc is at least 4. (CHF, Vasc disease, HTN, Female).   - She has history of Afib RVR in the past in the setting of her Lung CA.  - Previously on Xarelto but stopped due to hemoptysis.  - Remains in NSR  - Started on Eliquis in 12/22 due to renal infarct. - Continue Eliquis 2.5 mg bid. No bleeding issues.   8. H/o R renal infarct - 12/22 -  Thought to be cardioembolic. - Remains on Eliquis.     8. H/o SCLC:  - s/p treatment 2015. Lost to f/u since 04/2015.  - Chest CT 02/19/18 with mass-like consolidation in R hilum concerning for recurrent tumor.  - Repeat CT 03/27/2018 -No definite findings of locally recurrent tumor in the right lung. 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 Continued chest CT surveillance is advised in 3-6 months. - S/p thoracentesis 04/2018. Cytology negative for malignancy.  - CTA 11/19 stable scarring in the right hilum consistent with emphysema. Bronchoscopy on 11/15, no obvious source of bleeding   9. Nausea/vomiting - unclear etiology. KUB ok  - ? Low output HF. Started empiric milrinone at 0.125 on 12/10.  - seems to be better today. Continue milrinone today. - place PICC as needed  GOC: - DNR/DNI   Disposition: -HH PT recommended at discharge  Length of Stay: [place], [place], PA-C  11/20/2022, 7:29 AM  Advanced Heart Failure Team Pager [phone] (M-F; 7a - 5p)  Please contact [facility] for night-coverage after hours (5p -7a ) and weekends on [email or web address]  Patient seen and examined with the above-signed Advanced Practice Provider and/or  Housestaff. I personally reviewed laboratory data, imaging studies and relevant notes. I independently examined the patient and formulated the important aspects of the plan. I have edited the note to reflect any of my changes or salient points. I have personally discussed the plan with the patient and/or family.  Empiric milrinone started yesterday, Feels some better. Still dizzy. Nausea improving. Breathing improved but still not back to baseline  General:  Weak appearing. No resp difficulty HEENT: normal Neck: supple. JVP 8-9. Carotids 2+ bilat; no bruits. No lymphadenopathy or thryomegaly appreciated. Cor: PMI nondisplaced. Regular rate & rhythm. No rubs, gallops or murmurs. Lungs: coarse/rhonchi Abdomen: soft, nontender, nondistended. No hepatosplenomegaly. No bruits or masses. Good bowel sounds. Extremities: no cyanosis, clubbing, rash, edema Neuro: alert & orientedx3, cranial nerves grossly intact. moves all 4 extremities w/o difficulty. Affect pleasant  Suspect she has component of low output HF. Continue milrinone for now. Give one dose IV lasix. Also sounds to have component of vertigo. Would continue treatment per [facility].   Glori Bickers, MD  8:04 AM

## 2022-11-20 NOTE — Progress Notes (Addendum)
Occupational Therapy Treatment Patient Details Name: Brittany [name] MRN: [id number] DOB: 27-Oct-1961 Today's Date: 11/20/2022   History of present illness 61 y.o. female presented 11/14/22 with SOB. She was diagnosed with acute influenza pneumonia along with CHF exacerbation causing acute hypoxic respiratory failure. 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 Pt with poor activity tolerance and L lateral/posterior lean when seated up in chair and on EOB, needing min A overall to maintain sitting balance. Pt lethargic requiring increased encouragement to participate, VSS throughout session. Pt needing x3 rest breaks during seated grooming task. Pt presenting with impairments listed below, will follow acutely. Updating d/c recommendation to AIR.   Recommendations for follow up therapy are one component of a multi-disciplinary discharge planning process, led by the attending physician.  Recommendations may be updated based on patient status, additional functional criteria and insurance authorization.    Follow Up Recommendations  Acute inpatient rehab (3hours/day)     Assistance Recommended at Discharge Frequent or constant Supervision/Assistance  Patient can return home with the following  A little help with walking and/or transfers;A little help with bathing/dressing/bathroom;Assistance with cooking/housework;Assist for transportation;Help with stairs or ramp for entrance   Equipment Recommendations  Other (comment) (defer)    Recommendations for Other Services Rehab consult    Precautions / Restrictions Precautions Precautions: Fall Precaution  Comments: watch SpO2 Restrictions Weight Bearing Restrictions: No       Mobility Bed Mobility Overal bed mobility: Needs Assistance Bed Mobility: Supine to Sit     Supine to sit: Mod assist     General bed mobility comments: to scoot hips to EOB    Transfers Overall transfer level: Needs assistance Equipment used: 2 person hand held assist Transfers: Sit to/from Stand, Bed to chair/wheelchair/BSC Sit to Stand: Mod assist, +2 physical assistance, Min assist     Step pivot transfers: Mod assist, +2 physical assistance     General transfer comment: mod A +2,a t times fading to min with ability to pull self up at sink     Balance Overall balance assessment: Needs assistance Sitting-balance support: Feet supported Sitting balance-Leahy Scale: Fair Sitting balance - Comments: posterior lean in sitting, needing min A support minimum at EOB and in chair     Standing balance-Leahy Scale: Poor Standing balance comment: reliant on 2 person external support                           ADL either performed or assessed with clinical judgement   ADL Overall ADL's : Needs assistance/impaired     Grooming: Moderate assistance;Oral care;Brushing hair Grooming Details (indicate cue type and reason): seated/standing at sink                 Toilet Transfer: Moderate assistance;+2 for physical assistance;Squat-pivot;BSC/3in1;Rolling walker (2 wheels)           Functional mobility during ADLs: Moderate assistance;+2 for physical assistance      Extremity/Trunk Assessment Upper Extremity Assessment Upper Extremity Assessment: Generalized weakness   Lower Extremity Assessment Lower Extremity Assessment: Generalized weakness        Vision   Vision Assessment?: No apparent visual deficits  Perception Perception Perception: Not tested   Praxis Praxis Praxis: Not tested    Cognition Arousal/Alertness: Lethargic Behavior During Therapy: WFL for tasks  assessed/performed, Flat affect Overall Cognitive Status: Within Functional Limits for tasks assessed                                 General Comments: needing increased encouragment to participate, increased time to perform tasks, slow initiation. Decreased awareness of deficits        Exercises      Shoulder Instructions       General Comments VSS on supplemental O2    Pertinent Vitals/ Pain       Pain Assessment Pain Assessment: Faces Pain Score: 2  Faces Pain Scale: Hurts a little bit Pain Location: bottom Pain Descriptors / Indicators: Discomfort Pain Intervention(s): Limited activity within patient's tolerance, Monitored during session, Repositioned  Home Living                                          Prior Functioning/Environment              Frequency  Min 2X/week        Progress Toward Goals  OT Goals(current goals can now be found in the care plan section)  Progress towards OT goals: Not progressing toward goals - comment (decreased tolerance, at times self limiting)  Acute Rehab OT Goals OT Goal Formulation: With patient Time For Goal Achievement: 11/30/22 Potential to Achieve Goals: Good ADL Goals Pt Will Perform Grooming: Independently;standing Pt Will Perform Lower Body Bathing: with modified independence;sit to/from stand Pt Will Perform Lower Body Dressing: with modified independence;sit to/from stand Pt Will Transfer to Toilet: Independently;ambulating;regular height toilet Pt Will Perform Toileting - Clothing Manipulation and hygiene: Independently;sit to/from stand Additional ADL Goal #1: Pt will employ energy conservation and breathing techniques in ADLs and mobility.  Plan Discharge plan needs to be updated;Frequency remains appropriate    Co-evaluation    PT/OT/SLP Co-Evaluation/Treatment: Yes Reason for Co-Treatment: To address functional/ADL transfers;For patient/therapist safety   OT goals  addressed during session: ADL's and self-care      AM-PAC OT "6 Clicks" Daily Activity     Outcome Measure   Help from another person eating meals?: None Help from another person taking care of personal grooming?: A Little Help from another person toileting, which includes using toliet, bedpan, or urinal?: A Lot Help from another person bathing (including washing, rinsing, drying)?: A Lot Help from another person to put on and taking off regular upper body clothing?: A Lot Help from another person to put on and taking off regular lower body clothing?: A Lot 6 Click Score: 15    End of Session Equipment Utilized During Treatment: Oxygen  OT Visit Diagnosis: Muscle weakness (generalized) (M62.81);Unsteadiness on feet (R26.81);Other abnormalities of gait and mobility (R26.89)   Activity Tolerance Patient limited by fatigue   Patient Left in chair;with call bell/phone within reach;with nursing/sitter in room (NT present to assist pt with bathing)   Nurse Communication Mobility status        Time: 1325-1405 OT Time Calculation (min): 40 min  Charges: OT General Charges $OT Visit: 1 Visit OT Treatments $Self Care/Home Management : 8-22 mins $Therapeutic Activity: 8-22 mins  Fatumata Kashani K, OTD, OTR/L SecureChat Preferred Acute Rehab [phone]  Ulla Gallo 11/20/2022, 4:20 PM

## 2022-11-20 NOTE — Progress Notes (Signed)
Inpatient Rehab Admissions Coordinator:  ? ?Per therapy recommendations,  patient was screened for CIR candidacy by Brittany Belky Mundo, MS, CCC-SLP. At this time, Pt. Appears to be a a potential candidate for CIR. I will place   order for rehab consult per protocol for full assessment. Please contact me any with questions. ? ?Brittany Acie Custis, MS, CCC-SLP ?Rehab Admissions Coordinator  ?[phone] (celll) ?[phone] (office) ? ?

## 2022-11-20 NOTE — Progress Notes (Signed)
PROGRESS NOTE                                                                                                                                                                                                             Patient Demographics:    Brittany [name], is a 61 y.o. female, DOB - [birth year], AOZ:[id number]  Outpatient Primary MD for the patient is Elpidio Eric    LOS - 6  Admit date - 11/14/2022    Chief Complaint  Patient presents with   Shortness of Breath       Brief Narrative (HPI from H&P)    61 y.o. 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  She was diagnosed with acute influenza pneumonia along with CHF exacerbation causing acute hypoxic respiratory failure, she was placed on BiPAP and admitted to the hospital.   Subjective:   Patient in bed, appears comfortable, denies any headache, no fever, no chest pain or pressure, improved shortness of breath, improved nausea, less dizzy today.   Assessment  & Plan :   Sepsis secondary to influenza A with pneumonia, possibility of superimposed bacterial pneumonia as well with elevated procalcitonin and CRP, infection instigating acute on chronic diastolic CHF and acute on chronic hypoxic respiratory failure.  Patient's sepsis pathophysiology is improving she has been placed on Tamiflu for influenza A infection along with appropriate IV antibiotic combination of doxycycline and Rocephin, continue to monitor, now on PRN BiPAP for oxygen supplementation continue IV Lasix for diuresis, improving.  Note there is element of anxiety in her symptoms of  hypoxia as well.  Added Ativan on 11/16/2022 with good benefit.  Note she has persistently expressed her wishes to be DNR and does not want to be intubated if she gets worse.   Acute on chronic respiratory failure with hypoxia COPD with exacerbation - her wheezing has improved significantly, continue IV steroids at a much lower dose, continue treatment as above with supplemental oxygen and nebulizer treatments.  He has been requested to sit in chair in the daytime use I-S  and flutter valve for pulmonary toiletry.   Acute on chronic systolic heart failure EF 20% with AICD.  CHF team on board, continue diuresis with IV Lasix ( per CHF team), on digoxin and Aldactone which will be continued, defer addition of beta-blocker to the cardiology team.  To use ACE/ARB due to underlying CKD.  Fluid status has improved.  Elevated troponin, CAD - High-sensitivity troponin 152-> 294.  This is demand ischemia from hypoxia caused by problem stated above, troponin trend is flat and in non-ACS pattern, she is chest pain-free.  Cardiology team on board.  Continue statin for secondary prevention.  PAF on chronic anticoagulation - CHA2DS2-VASc score equal to at least 4.  On Eliquis and digoxin. Continue.   CKD stage IIIa - Stable   History of squamous cell lung cancer - Patient with prior treatment chemotherapy, chest radiation, and prophylactic brain radiation in 2015.   Hypokalemia.  Replaced.  Mild Nausea & some dizziness - stable exam, stable KUB, digoxin level stable, ? BPPV added Midodrene + Benzo, all antibiotics stopped question if it was side effects of antibiotics, also blood pressure slightly soft hence TED stockings added, continue PT OT.  Dizziness improving.  No nausea now.       Condition - Extremely Guarded  Family Communication  :    Sister Olin Hauser [phone] - 11/16/22 message left at 12:27 PM  Berline Lopes [phone]  - 11/16/22  Code Status :  Full  Consults  :  Cards, PCCM  PUD  Prophylaxis :    Procedures  :           Disposition Plan  :    Status is: Inpatient   DVT Prophylaxis  :    Place TED hose Start: 11/19/22 0738 apixaban (ELIQUIS) tablet 5 mg     Lab Results  Component Value Date   PLT 164 11/20/2022    Diet :  Diet Order             DIET SOFT Fluid consistency: Thin; Fluid restriction: 1500 mL Fluid  Diet effective now                    Inpatient Medications  Scheduled Meds:  apixaban  5 mg Oral BID   atorvastatin  80 mg Oral Daily   dapagliflozin propanediol  10 mg Oral Daily   digoxin  0.125 mg Oral Daily   guaiFENesin  600 mg Oral BID   ivabradine  7.5 mg Oral BID WC   LORazepam  1 mg Oral Q0600   meclizine  25 mg Oral BID   sodium chloride flush  3 mL Intravenous Q12H   spironolactone  25 mg Oral Daily   Continuous Infusions:  milrinone 0.125 mcg/kg/min (11/19/22 1701)    PRN Meds:.acetaminophen **OR** acetaminophen, albuterol, loperamide, naphazoline-pheniramine, nitroGLYCERIN, ondansetron (ZOFRAN) IV    Objective:   Vitals:   11/19/22 2106 11/19/22 2311 11/20/22 0312 11/20/22 0739  BP: (!) 106/52 (!) 104/49 (!) 100/46 123/66  Pulse:  64 65 89  Resp:  16 18 18   Temp:  98.2 F (36.8 C) 98.4 F (36.9 C) 99.1 F (37.3 C)  TempSrc:  Axillary Oral Oral  SpO2:  100% 98% 92%  Weight:   49.4 kg   Height:        Wt Readings from Last 3 Encounters:  11/20/22 49.4 kg  11/08/22 49.5 kg  08/09/22 48 kg     Intake/Output Summary (Last 24 hours) at 11/20/2022 0843 Last data  filed at 11/20/2022 0818 Gross per 24 hour  Intake 960.51 ml  Output 700 ml  Net 260.51 ml     Physical Exam  Awake Alert, No new F.N deficits, Normal affect [place].AT,PERRAL Supple Neck, No JVD,   Symmetrical Chest wall movement, Good air movement bilaterally, minimally coarse breath sounds RRR,No Gallops, Rubs or new Murmurs,  +ve B.Sounds, Abd Soft, No tenderness,   No Cyanosis, Clubbing or edema      Data Review:     Recent Labs  Lab 11/16/22 0139 11/17/22 0112 11/18/22 0031 11/19/22 0049 11/20/22 0121  WBC 9.9 5.8 7.5 6.8 7.8  HGB 12.6 12.3 13.0 13.3 14.0  HCT 39.3 38.5 39.9 40.7 44.3  PLT 162 132* 177 173 164  MCV 91.4 92.3 91.5 90.6 92.3  MCH 29.3 29.5 29.8 29.6 29.2  MCHC 32.1 31.9 32.6 32.7 31.6  RDW 19.3* 19.4* 18.8* 18.9* 19.2*  LYMPHSABS 0.4* 0.3* 0.3* 0.5* 0.3*  MONOABS 0.5 0.3 0.7 0.5 0.4  EOSABS 0.0 0.0 0.0 0.0 0.0  BASOSABS 0.0 0.0 0.0 0.0 0.0    Recent Labs  Lab 11/14/22 0757 11/14/22 0835 11/14/22 1009 11/14/22 1355 11/15/22 0122 11/15/22 0206 11/16/22 0139 11/17/22 0112 11/18/22 0031 11/19/22 0049 11/20/22 0121  NA 140   < >  --   --  141   < > 139 137 137 139 136  K 3.7   < >  --   --  4.0   < > 3.4* 4.2 3.7 4.8 4.4  CL 104  --   --   --  98  --  102 102 101 102 102  CO2 23  --   --   --  26  --  26 25 24 26 22   GLUCOSE 184*  --   --   --  153*  --  119* 178* 180* 191* 101*  BUN 22  --   --   --  28*  --  28* 30* 33* 25* 21  CREATININE 1.28*  --   --   --  1.51*  --  1.18* 1.26* 1.38* 1.08* 0.97  AST  --   --   --   --  64*  --   --   --   --   --   --   ALT  --   --   --   --  44  --   --   --   --   --   --   ALKPHOS  --   --   --   --  34  --   --   --   --   --   --   BILITOT  --   --   --   --  0.9  --   --   --   --   --   --   ALBUMIN  --   --   --   --  3.7  --   --   --   --   --   --   CRP  --   --   --   --  13.2*  --  9.5* 4.5* 1.8* 3.1*  --   PROCALCITON  --   --   --  8.49 12.90  --  6.85  --   --   --   --   LATICACIDVEN 2.7*  --  1.7  --   --   --   --   --   --   --   --  BNP 3,393.9*  --   --   --  3,188.1*  --  1,702.3* 893.3* 1,265.8* 1,554.9* 1,081.0*  MG  --   --   --   --  2.3  --  2.3 2.1 1.9 1.9  --   CALCIUM 9.5  --   --   --  9.7  --  9.3 8.9 8.8* 9.1 8.8*   < > = values in this interval not displayed.      Radiology Reports DG Chest Port 1 View  Result Date: 11/20/2022 CLINICAL DATA:  Dyspnea EXAM: PORTABLE CHEST 1 VIEW  COMPARISON:  Chest radiograph from one day prior. FINDINGS: Stable spinal stimulator with lead coursing superiorly into the right neck. Intact sternotomy wires. Stable configuration of single lead left subclavian ICD. Stable cardiomediastinal silhouette with top-normal heart size. No pneumothorax. No pleural effusion. Stable streaky mild bibasilar lung opacities. No pulmonary edema. IMPRESSION: Stable streaky mild bibasilar lung opacities, favor scarring or atelectasis. Otherwise no active cardiopulmonary disease. Electronically Signed   By: Ilona Sorrel M.D.   On: 11/20/2022 07:34   DG Chest Port 1 View  Result Date: 11/19/2022 CLINICAL DATA:  Shortness of breath EXAM: PORTABLE CHEST 1 VIEW COMPARISON:  Two days ago FINDINGS: Cardiomegaly. Right perihilar indistinctness with scar-like appearance by CT in the setting of prior radiotherapy. Vagal nerve stimulator on the right and single chamber defibrillator lead into the right ventricle on the left. Prior CABG. There is no edema, consolidation, effusion, or pneumothorax. IMPRESSION: Stable post treatment chest.  No evidence of acute disease. Electronically Signed   By: Jorje Guild M.D.   On: 11/19/2022 06:20   DG Abd Portable 1V  Result Date: 11/18/2022 CLINICAL DATA:  Nausea vomiting EXAM: PORTABLE ABDOMEN - 1 VIEW COMPARISON:  None Available. FINDINGS: The bowel gas pattern is normal. No radio-opaque calculi or other significant radiographic abnormality are seen. IMPRESSION: Negative. Electronically Signed   By: Sammie Bench M.D.   On: 11/18/2022 08:36   DG Chest Port 1 View  Result Date: 11/17/2022 CLINICAL DATA:  Shortness of breath. EXAM: PORTABLE CHEST 1 VIEW COMPARISON:  11/16/2022 FINDINGS: 0644 hours. The cardio pericardial silhouette is enlarged. Left-sided AICD again noted. Battery pack for right cervical stimulator device evident. There is pulmonary vascular congestion without overt pulmonary edema. Underlying diffuse interstitial  opacity is similar to prior. Similar appearance subtle airspace disease in both lung bases. Pleural effusion. Telemetry leads overlie the chest. IMPRESSION: No substantial interval change in exam. Electronically Signed   By: Misty Stanley M.D.   On: 11/17/2022 07:07      Signature  -   Lala Lund M.D on 11/20/2022 at 8:43 AM   -  To page go to [email or web address]

## 2022-11-20 NOTE — Progress Notes (Signed)
Physical Therapy Treatment Patient Details Name: Brittany [name] MRN: [id number] DOB: 03-29-1961 Today's Date: 11/20/2022   History of Present Illness 61 y.o. female presented 11/14/22 with SOB. She was diagnosed with acute influenza pneumonia along with CHF exacerbation causing acute hypoxic respiratory failure. PMH: PAF, hypertension, previous small cell lung cancer treated with chemo/ chest XRT/ prophylactic brain radiation in 2015, CAD s/p CABG, chronic systolic HF EF ~29%, CKD stage IIIa, prior history of tobacco abuse    PT Comments    Pt was seen for progression of mobility on RW to stand but then on B HHA to avoid fall risk.  Pt is too weak and distracted for walker, requires signficant redirection to step toward and sit on recliner.  Same attention to detail is required for use of chair at the sink, where pt is weak and by end of standing efforts was spitting toothpaste on her own hand.  Will recommend her to continue therapy for recovery of enough strength and balance to walk independently again, and will work on reducing the burden of care for her staff and family with increased standing control and walking with less assistance.  Pt is on O2 and will need to monitor her vitals for tolerance with all movement.    Recommendations for follow up therapy are one component of a multi-disciplinary discharge planning process, led by the attending physician.  Recommendations may be updated based on patient status, additional functional criteria and insurance authorization.  Follow Up Recommendations  Acute inpatient rehab (3hours/day)     Assistance Recommended at Discharge Intermittent Supervision/Assistance  Patient can return home with the following A little help with walking and/or transfers;A little help with bathing/dressing/bathroom;Assistance with cooking/housework;Direct supervision/assist for medications management;Direct supervision/assist for financial management;Assist for  transportation;Help with stairs or ramp for entrance   Equipment Recommendations  None recommended by PT    Recommendations for Other Services Rehab consult     Precautions / Restrictions Precautions Precautions: Fall Precaution Comments: watch SpO2 Restrictions Weight Bearing Restrictions: No     Mobility  Bed Mobility Overal bed mobility: Needs Assistance Bed Mobility: Supine to Sit     Supine to sit: Mod assist     General bed mobility comments: using bed pad to help scoot out    Transfers Overall transfer level: Needs assistance Equipment used: 2 person hand held assist Transfers: Sit to/from Stand, Bed to chair/wheelchair/BSC Sit to Stand: Mod assist, +2 safety/equipment, +2 physical assistance   Step pivot transfers: Mod assist, +2 physical assistance, +2 safety/equipment       General transfer comment: 2 mod with standing but finally 2 min to finish with toothbrushing at sink    Ambulation/Gait               General Gait Details: unable to walk   Stairs             Wheelchair Mobility    Modified Rankin (Stroke Patients Only)       Balance Overall balance assessment: Needs assistance Sitting-balance support: Feet supported Sitting balance-Leahy Scale: Fair     Standing balance support: Bilateral upper extremity supported, During functional activity Standing balance-Leahy Scale: Poor Standing balance comment: two support with assist to wgt shift                            Cognition Arousal/Alertness: Lethargic Behavior During Therapy: Flat affect, WFL for tasks assessed/performed Overall Cognitive Status: No family/caregiver present to determine baseline  cognitive functioning                                 General Comments: slow processing, slow to take up information        Exercises      General Comments General comments (skin integrity, edema, etc.): O2 used with support for wb work and cues  for body mechanics      Pertinent Vitals/Pain Pain Assessment Pain Assessment: Faces Faces Pain Scale: Hurts a little bit Pain Location: perineum Pain Descriptors / Indicators: Guarding, Sore Pain Intervention(s): Monitored during session, Repositioned    Home Living                          Prior Function            PT Goals (current goals can now be found in the care plan section) Acute Rehab PT Goals Patient Stated Goal: to rest some Progress towards PT goals: Not progressing toward goals - comment    Frequency    Min 3X/week      PT Plan Discharge plan needs to be updated    Co-evaluation PT/OT/SLP Co-Evaluation/Treatment: Yes Reason for Co-Treatment: For patient/therapist safety;To address functional/ADL transfers PT goals addressed during session: Mobility/safety with mobility;Balance OT goals addressed during session: ADL's and self-care      AM-PAC PT "6 Clicks" Mobility   Outcome Measure  Help needed turning from your back to your side while in a flat bed without using bedrails?: None Help needed moving from lying on your back to sitting on the side of a flat bed without using bedrails?: None Help needed moving to and from a bed to a chair (including a wheelchair)?: A Little Help needed standing up from a chair using your arms (e.g., wheelchair or bedside chair)?: A Little Help needed to walk in hospital room?: A Little Help needed climbing 3-5 steps with a railing? : A Lot 6 Click Score: 19    End of Session Equipment Utilized During Treatment: Oxygen;Gait belt Activity Tolerance: Patient limited by fatigue;Treatment limited secondary to medical complications (Comment);Patient limited by lethargy Patient left: in chair;with call bell/phone within reach;with chair alarm set Nurse Communication: Mobility status PT Visit Diagnosis: Muscle weakness (generalized) (M62.81);Other abnormalities of gait and mobility (R26.89);Unsteadiness on feet  (R26.81)     Time: 1325-1406 PT Time Calculation (min) (ACUTE ONLY): 41 min  Charges:  $Therapeutic Activity: 8-22 mins      Ramond Dial 11/20/2022, 5:30 PM  Mee Hives, PT PhD Acute Rehab Dept. Number: [place] and [place]

## 2022-11-20 NOTE — TOC Progression Note (Signed)
Transition of Care [facility]) - Progression Note    Patient Details  Name: Brittany [name] MRN: [id number] Date of Birth: 1961/06/02  [facility] CM/SW Contact  Erenest Rasher, RN Phone Number: [phone] 11/20/2022, 3:07 PM  Clinical Narrative:    HF TOC CM spoke to pt at bedside. States she has an apt. She has oxygen, NIV, scale, Rollator and neb machine at home. States her SO will provide transportation home. Will continue to follow for dc needs. Will request meds come up from [place] at dc.   Expected Discharge Plan: [place] Barriers to Discharge: Continued Medical Work up  Expected Discharge Plan and Services Expected Discharge Plan: [place]   Discharge Planning Services: CM Consult Post Acute Care Choice: [place] arrangements for the past 2 months: Apartment                                       Social Determinants of Health (SDOH) Interventions    Readmission Risk Interventions    06/20/2022   10:00 AM 04/11/2021   10:16 AM  Readmission Risk Prevention Plan  Transportation Screening Complete Complete  PCP or Specialist Appt within 3-5 Days Complete Complete  HRI or Home Care Consult Complete Complete  Social Work Consult for [place] Planning/Counseling Complete Complete  Palliative Care Screening Not Applicable Not Applicable  Medication Review [employer]) Complete Complete

## 2022-11-20 NOTE — Plan of Care (Signed)
  Problem: Nutrition: Goal: Adequate nutrition will be maintained Outcome: Completed/Met   Problem: Pain Managment: Goal: General experience of comfort will improve Outcome: Completed/Met   Problem: Safety: Goal: Ability to remain free from injury will improve Outcome: Completed/Met

## 2022-11-21 ENCOUNTER — Inpatient Hospital Stay (HOSPITAL_COMMUNITY): Payer: BC Managed Care – PPO

## 2022-11-21 DIAGNOSIS — J441 Chronic obstructive pulmonary disease with (acute) exacerbation: Secondary | ICD-10-CM | POA: Diagnosis not present

## 2022-11-21 DIAGNOSIS — I5022 Chronic systolic (congestive) heart failure: Secondary | ICD-10-CM | POA: Diagnosis not present

## 2022-11-21 DIAGNOSIS — R0600 Dyspnea, unspecified: Secondary | ICD-10-CM | POA: Diagnosis not present

## 2022-11-21 DIAGNOSIS — J9621 Acute and chronic respiratory failure with hypoxia: Secondary | ICD-10-CM | POA: Diagnosis not present

## 2022-11-21 LAB — BASIC METABOLIC PANEL
Anion gap: 6 (ref 5–15)
BUN: 19 mg/dL (ref 8–23)
CO2: 29 mmol/L (ref 22–32)
Calcium: 9.1 mg/dL (ref 8.9–10.3)
Chloride: 102 mmol/L (ref 98–111)
Creatinine, Ser: 0.93 mg/dL (ref 0.44–1.00)
GFR, Estimated: >60 mL/min (ref >60)
Glucose, Bld: 179 mg/dL — ABNORMAL HIGH (ref 70–99)
Potassium: 3.7 mmol/L (ref 3.5–5.1)
Sodium: 137 mmol/L (ref 135–145)

## 2022-11-21 LAB — PROCALCITONIN: Procalcitonin: <0.1 ng/mL

## 2022-11-21 LAB — C-REACTIVE PROTEIN: CRP: 7 mg/dL — ABNORMAL HIGH (ref <1.0)

## 2022-11-21 MED ORDER — POTASSIUM CHLORIDE CRYS ER 20 MEQ PO TBCR
20.0000 meq | EXTENDED_RELEASE_TABLET | Freq: Once | ORAL | Status: AC
  Administered 2022-11-21: 20 meq via ORAL
  Filled 2022-11-21: qty 1

## 2022-11-21 NOTE — Progress Notes (Addendum)
Advanced Heart Failure Rounding Note  PCP-Cardiologist: Dr. [name]   Subjective:    12/10: Empiric milrinone started  Continues on milrinone 0.125. Volume status ok. Wt stable, 103 lb.  Scr stable 0.93.    Breathing improving. Back on 2L [place] (home baseline)   Still dizzy w/ positional changes, if stands too quickly and w/ head turning, but seems to be improving. Primary team added meclizine.  PT recommending CIR. Pt not interested.   Objective:   Weight Range: 47.1 kg Body mass index is 20.97 kg/m.   Vital Signs:   Temp:  [98.3 F (36.8 C)-99.9 F (37.7 C)] 99.3 F (37.4 C) (12/12 0423) Pulse Rate:  [72-102] 72 (12/12 0423) Resp:  [17-20] 20 (12/12 0423) BP: (107-123)/(56-78) 108/56 (12/12 0423) SpO2:  [92 %-100 %] 100 % (12/12 0423) Weight:  [47.1 kg] 47.1 kg (12/12 0556) Last BM Date : 11/19/22  Weight change: Filed Weights   11/20/22 0312 11/21/22 0423 11/21/22 0556  Weight: 49.4 kg 47.1 kg 47.1 kg    Intake/Output:   Intake/Output Summary (Last 24 hours) at 11/21/2022 0736 Last data filed at 11/21/2022 0439 Gross per 24 hour  Intake 850.37 ml  Output 950 ml  Net -99.63 ml      Physical Exam    General:  weak appearing. No respiratory difficulty HEENT: normal Neck: supple. JVD 7 cm. Carotids 2+ bilat; no bruits. No lymphadenopathy or thyromegaly appreciated. Cor: PMI nondisplaced. Regular rate & rhythm. No rubs, gallops or murmurs. Lungs: diffuse inspiratory wheezing bilaterally  Abdomen: soft, nontender, nondistended. No hepatosplenomegaly. No bruits or masses. Good bowel sounds. Extremities: no cyanosis, clubbing, rash, edema + TED hoses  Neuro: alert & oriented x 3, cranial nerves grossly intact. moves all 4 extremities w/o difficulty. Affect pleasant.   Telemetry   SR 60s-70s, 2-3 PVCs/min (personally reviewed)  Labs    CBC Recent Labs    11/19/22 0049 11/20/22 0121  WBC 6.8 7.8  NEUTROABS 5.8 7.1  HGB 13.3 14.0  HCT 40.7  44.3  MCV 90.6 92.3  PLT 173 295   Basic Metabolic Panel Recent Labs    11/19/22 0049 11/20/22 0121 11/21/22 0048  NA 139 136 137  K 4.8 4.4 3.7  CL 102 102 102  CO2 26 22 29   GLUCOSE 191* 101* 179*  BUN 25* 21 19  CREATININE 1.08* 0.97 0.93  CALCIUM 9.1 8.8* 9.1  MG 1.9  --   --    Liver Function Tests No results for input(s): "AST", "ALT", "ALKPHOS", "BILITOT", "PROT", "ALBUMIN" in the last 72 hours.  No results for input(s): "LIPASE", "AMYLASE" in the last 72 hours. Cardiac Enzymes No results for input(s): "CKTOTAL", "CKMB", "CKMBINDEX", "TROPONINI" in the last 72 hours.  BNP: BNP (last 3 results) Recent Labs    11/18/22 0031 11/19/22 0049 11/20/22 0121  BNP 1,265.8* 1,554.9* 1,081.0*    ProBNP (last 3 results) No results for input(s): "PROBNP" in the last 8760 hours.   D-Dimer No results for input(s): "DDIMER" in the last 72 hours. Hemoglobin A1C No results for input(s): "HGBA1C" in the last 72 hours. Fasting Lipid Panel No results for input(s): "CHOL", "HDL", "LDLCALC", "TRIG", "CHOLHDL", "LDLDIRECT" in the last 72 hours. Thyroid Function Tests No results for input(s): "TSH", "T4TOTAL", "T3FREE", "THYROIDAB" in the last 72 hours.  Invalid input(s): "FREET3"  Other results:   Imaging    DG Chest Port 1 View  Result Date: 11/21/2022 CLINICAL DATA:  61 year old female with history of shortness of breath. EXAM:  PORTABLE CHEST 1 VIEW COMPARISON:  Chest x-ray 11/20/2022. FINDINGS: Lung volumes are normal. No consolidative airspace disease. Small bilateral pleural effusions (right greater than left). No pneumothorax. Cephalization of the pulmonary vasculature, without frank pulmonary edema. Mild cardiomegaly. Upper mediastinal contours are within normal limits. Atherosclerotic calcifications in the thoracic aorta. Status post median sternotomy for CABG. Left-sided pacemaker/AICD with lead tip projecting over the expected location of the right ventricle apex.  Electronic device in the lateral right hemithorax with lead extending cephalad, potentially a deep brain stimulator. IMPRESSION: 1. Cardiomegaly with pulmonary venous congestion, but no frank pulmonary edema. 2. Small bilateral pleural effusions. 3. Aortic atherosclerosis. 4. Postoperative changes and support apparatus, as above. Electronically Signed   By: Vinnie Langton M.D.   On: 11/21/2022 06:44     Medications:     Scheduled Medications:  apixaban  5 mg Oral BID   atorvastatin  80 mg Oral Daily   dapagliflozin propanediol  10 mg Oral Daily   digoxin  0.125 mg Oral Daily   guaiFENesin  600 mg Oral BID   ivabradine  7.5 mg Oral BID WC   LORazepam  1 mg Oral Q0600   sodium chloride flush  3 mL Intravenous Q12H   spironolactone  25 mg Oral Daily    Infusions:  milrinone 0.125 mcg/kg/min (11/20/22 1239)    PRN Medications: acetaminophen **OR** acetaminophen, albuterol, loperamide, naphazoline-pheniramine, nitroGLYCERIN, ondansetron (ZOFRAN) IV    Patient Profile   Brittany [name] is a 61 y.o.[place] female with a history of PAF,  previous smoker quit 2015  years ago, hypertension, 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 Presented to the ED with worsening SOB, multifactorial from COPDE, CHF and found to be influenza +. AHF team asked to see for acute on chronic systolic heart failure.   Assessment/Plan   Acute hypoxic respiratory failure due to COPD w/ exacerbation, +influenza - tested + for influenza, WBC 13.3 on admit, completed abx and tamiflu -> WBC 7.8 - Now off Bipap.  - Lung exam still coarse   2. Acute on chronic Systolic Heart Failure Due to ICM  - Has [employer] single chamber ICD  - Echo 07/10/22: EF 20-25%, global hypokinesis, GIIDD, RV systolic function, mildly reduced. RV size normal. Mild MR, trivial TR, aortic valve w/ mild-mod regurg.  - NYHA class  IV - Volume overloaded on admit. Diuresed with IV lasix - Empiric milrinone added 12/10 d/t concern for low-output - Volume looks okay on exam. Did not add po diuretic today d/t poor po intake. - Continue digoxin 0.125mg  daily, dig level okay 12/09 - Continue Farxiga 10 mg daily.  - Continue spiro 25 mg daily. - Continue Corlanor 7.5 mg bid - Failed Entresto due to hyperkalemia.   - No b-blocker due to intolerance with low output and bad COPD with frequent flares - Not a candidate for advanced therapies with her degree of lung disease.   - S/p barostim - stop milrinone today. Follow SCr/CO2   3. CAD  - Hx of NSTEMI/STEMI s/p Emergent CABG 02/03/18: Cath with severe 2v CAD as above with severe LV dysfunction/ICM.  - s/p Emergent CABG 02/03/18.   - Had NSTEMI and now s/p LHC 02/12/19 with DES to the ostial ramus extending into the distal left main.  - No longer on brillinta or ASA due to bleeding and need for [facility]. - Chest pain 6/10, persistent for the  last 5 days (around the same time cough started) Doubt ischemic. HsTrop [phone].  EKG non acute  -> no further ischemic w/u indicated - Continue high-intensity statin. - No s/s angina  4. Dizziness - suspect vertigo - d/w hospitalist. Improving Meclizine added - Will also order orthostatics. May need abdominal binder  - continue to work w/ PT/OT    5. Chronic Hypoxic Respiratory Failure  - Multifactorial, former smoker, h/o small cell lung cancer treated with chemo, chest XRT, COPD and HF - She has home O2 ([employer]). - Treating as above   6. Recurrent pleural effusions s/p pleurodesis - Tiny effusion R base, not seen on repeat CXR   7. PAF - CHA2DS2/VASc is at least 4. (CHF, Vasc disease, HTN, Female).   - She has history of Afib RVR in the past in the setting of her Lung CA.  - Previously on Xarelto but stopped due to hemoptysis.  - Remains in NSR  - Started on Eliquis in 12/22 due to renal infarct. - Continue Eliquis 2.5 mg  bid. No bleeding issues.   8. 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 - Remains on Eliquis.     8. H/o SCLC:  - s/p treatment 2015. Lost to f/u since 04/2015.  - Chest CT 02/19/18 with mass-like consolidation in R hilum concerning for recurrent tumor.  - Repeat CT 03/27/2018 -No definite findings of locally recurrent tumor in the right lung. 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 Continued chest CT surveillance is advised in 3-6 months. - S/p thoracentesis 04/2018. Cytology negative for malignancy.  - CTA 11/19 stable scarring in the right hilum consistent with emphysema. Bronchoscopy on 11/15, no obvious source of bleeding   9. Nausea/vomiting - unclear etiology. KUB ok  - ? Low output HF. Started empiric milrinone at 0.125 on 12/10.  - seems to be better today. Continue milrinone today. - place PICC as needed  GOC: - DNR/DNI   Disposition: -  PT recommending CIR.   Length of Stay: [address] [place]  11/21/2022, 7:36 AM  Advanced Heart Failure Team Pager [phone] (M-F; 7a - 5p)  Please contact [facility] for night-coverage after hours (5p -7a ) and weekends on [email or web address]   Patient seen and examined with the above-signed Advanced Practice Provider and/or Housestaff. I personally reviewed laboratory data, imaging studies and relevant notes. I independently examined the patient and formulated the important aspects of the plan. I have edited the note to reflect any of my changes or salient points. I have personally discussed the plan with the patient and/or family.  Feeling a bit better today. N/v resolved. Still a bitt dizzy. Breathing improved. No CP, orthopnea or PND.   Milrinone stopped this am  General:  Weak appearing. No resp difficulty HEENT: normal Neck: supple. JVP 6-7 Carotids 2+ bilat; no bruits. No lymphadenopathy or thryomegaly appreciated. Cor: PMI  nondisplaced. Regular rate & rhythm. No rubs, gallops or murmurs. Lungs: coarse Abdomen: soft, nontender, nondistended. No hepatosplenomegaly. No bruits or masses. Good bowel sounds. Extremities: no cyanosis, clubbing, rash, edema Neuro: alert & orientedx3, cranial nerves grossly intact. moves all 4 extremities w/o difficulty. Affect pleasant  Improved but still tenuous. Watch closely off milrinone. Can switch to oral diuretics in the am. Management of probable vertigo per primary team.   Glori Bickers, MD  3:47 PM

## 2022-11-21 NOTE — Progress Notes (Signed)
PROGRESS NOTE                                                                                                                                                                                                             Patient Demographics:    Brittany [name], is a 61 y.o. female, DOB - [birth year], HWT:[id number]  Outpatient Primary MD for the patient is Traci Sermon, PA-C    LOS - 7  Admit date - 11/14/2022    Chief Complaint  Patient presents with   Shortness of Breath       Brief Narrative (HPI from H&P)    61 y.o. female with medical history significant of PAF, hypertension, previous small cell lung cancer treated with chemo/ chest XRT/ prophylactic brain radiation in 2015, CAD s/p CABG, chronic systolic HF EF ~91%, CKD stage IIIa, prior history of tobacco abuse who presents with complaints of progressively worsening shortness of breath over the last 3 to 4 days.  She was diagnosed with acute influenza pneumonia along with CHF exacerbation causing acute hypoxic respiratory failure, she was placed on BiPAP and admitted to the hospital.   Subjective:   Patient in bed, appears comfortable, denies any headache, no fever, no chest pain or pressure, improved shortness of breath, improved nausea, less dizzy today.   Assessment  & Plan :   Sepsis secondary to influenza A with pneumonia, possibility of superimposed bacterial pneumonia as well with elevated procalcitonin and CRP, infection instigating acute on chronic diastolic CHF and acute on chronic hypoxic respiratory failure.  Patient's sepsis pathophysiology has resolved she has finished treatment with Tamiflu and antibiotics for influenza A infection and bronchitis, continue use I-S and flutter valve for pulmonary toiletry, was initially on as needed BiPAP but now on nasal cannula oxygen and stable.  Diuresis per CHF team. Note there is element of anxiety in her  symptoms of hypoxia as well.  Added Ativan on 11/16/2022 with good benefit.  Note she has persistently expressed her wishes to be DNR and does not want to be intubated if she gets worse.   Acute on chronic respiratory failure with hypoxia COPD with exacerbation - her wheezing has improved significantly, now off of steroids, continue treatment as above with supplemental oxygen and nebulizer treatments.  He has been requested to sit in chair in the daytime use  I-S and flutter valve for pulmonary toiletry.   Acute on chronic systolic heart failure EF 20% with AICD.  CHF team on board, continue diuresis with IV Lasix ( per CHF team), on digoxin, ivabradine, Farxiga and Aldactone which will be continued, currently on milrinone drip for the last 48 hours started on 11/19/2022, defer addition of beta-blocker to the cardiology team.  Unable to use ACE/ARB due to underlying CKD.  Fluid status has improved.  Elevated troponin, CAD - High-sensitivity troponin 152-> 294.  This is demand ischemia from hypoxia caused by problem stated above, troponin trend is flat and in non-ACS pattern, she is chest pain-free.  Cardiology team on board.  Continue statin for secondary prevention.  PAF on chronic anticoagulation - CHA2DS2-VASc score equal to at least 4.  On Eliquis and digoxin. Continue.   CKD stage IIIa - Stable   History of squamous cell lung cancer - Patient with prior treatment chemotherapy, chest radiation, and prophylactic brain radiation in 2015.   Hypokalemia.  Replaced.  Mild Nausea & some dizziness - stable exam, stable KUB, digoxin level stable, ? BPPV added meclizine + Benzo, all antibiotics stopped question if it was side effects of antibiotics, also blood pressure slightly soft hence TED stockings added, continue PT OT.  Dizziness improving.  No nausea now.       Condition - Extremely Guarded  Family Communication  :    Sister Olin Hauser [phone] - 11/16/22 message left at 12:27 PM  Berline Lopes [phone]  - 11/16/22  Code Status :  Full  Consults  :  Cards, PCCM  PUD Prophylaxis :    Procedures  :           Disposition Plan  :    Status is: Inpatient   DVT Prophylaxis  :    Place TED hose Start: 11/19/22 0738 apixaban (ELIQUIS) tablet 5 mg     Lab Results  Component Value Date   PLT 164 11/20/2022    Diet :  Diet Order             DIET SOFT Fluid consistency: Thin; Fluid restriction: 1500 mL Fluid  Diet effective now                    Inpatient Medications  Scheduled Meds:  apixaban  5 mg Oral BID   atorvastatin  80 mg Oral Daily   dapagliflozin propanediol  10 mg Oral Daily   digoxin  0.125 mg Oral Daily   guaiFENesin  600 mg Oral BID   ivabradine  7.5 mg Oral BID WC   LORazepam  1 mg Oral Q0600   sodium chloride flush  3 mL Intravenous Q12H   spironolactone  25 mg Oral Daily   Continuous Infusions:  milrinone 0.125 mcg/kg/min (11/20/22 1239)    PRN Meds:.acetaminophen **OR** acetaminophen, albuterol, loperamide, naphazoline-pheniramine, nitroGLYCERIN, ondansetron (ZOFRAN) IV    Objective:   Vitals:   11/20/22 2337 11/21/22 0423 11/21/22 0556 11/21/22 0910  BP: 107/78 (!) 108/56  (!) 99/57  Pulse: 76 72  80  Resp: 20 20  17   Temp: 99.4 F (37.4 C) 99.3 F (37.4 C)  98.4 F (36.9 C)  TempSrc: Oral Oral  Oral  SpO2: 96% 100%  96%  Weight:  47.1 kg 47.1 kg   Height:        Wt Readings from Last 3 Encounters:  11/21/22 47.1 kg  11/08/22 49.5 kg  08/09/22 48 kg     Intake/Output Summary (  Last 24 hours) at 11/21/2022 0929 Last data filed at 11/21/2022 0910 Gross per 24 hour  Intake 410.94 ml  Output 950 ml  Net -539.06 ml     Physical Exam  Awake Alert, No new F.N deficits, Normal affect [place].AT,PERRAL Supple Neck, No JVD,   Symmetrical Chest wall movement, Good air movement bilaterally, minimal rales RRR,No Gallops, Rubs or new Murmurs,  +ve B.Sounds, Abd Soft, No tenderness,   No Cyanosis,  Clubbing or edema       Data Review:    Recent Labs  Lab 11/16/22 0139 11/17/22 0112 11/18/22 0031 11/19/22 0049 11/20/22 0121  WBC 9.9 5.8 7.5 6.8 7.8  HGB 12.6 12.3 13.0 13.3 14.0  HCT 39.3 38.5 39.9 40.7 44.3  PLT 162 132* 177 173 164  MCV 91.4 92.3 91.5 90.6 92.3  MCH 29.3 29.5 29.8 29.6 29.2  MCHC 32.1 31.9 32.6 32.7 31.6  RDW 19.3* 19.4* 18.8* 18.9* 19.2*  LYMPHSABS 0.4* 0.3* 0.3* 0.5* 0.3*  MONOABS 0.5 0.3 0.7 0.5 0.4  EOSABS 0.0 0.0 0.0 0.0 0.0  BASOSABS 0.0 0.0 0.0 0.0 0.0    Recent Labs  Lab  0000 11/14/22 1009 11/14/22 1355 11/15/22 0122 11/15/22 0206 11/16/22 0139 11/17/22 0112 11/18/22 0031 11/19/22 0049 11/20/22 0121 11/21/22 0048 11/21/22 0620  NA  --   --   --  141   < > 139 137 137 139 136 137  --   K  --   --   --  4.0   < > 3.4* 4.2 3.7 4.8 4.4 3.7  --   CL   < >  --   --  98  --  102 102 101 102 102 102  --   CO2   < >  --   --  26  --  26 25 24 26 22 29   --   GLUCOSE   < >  --   --  153*  --  119* 178* 180* 191* 101* 179*  --   BUN   < >  --   --  28*  --  28* 30* 33* 25* 21 19  --   CREATININE   < >  --   --  1.51*  --  1.18* 1.26* 1.38* 1.08* 0.97 0.93  --   AST  --   --   --  64*  --   --   --   --   --   --   --   --   ALT  --   --   --  44  --   --   --   --   --   --   --   --   ALKPHOS  --   --   --  73  --   --   --   --   --   --   --   --   BILITOT  --   --   --  0.9  --   --   --   --   --   --   --   --   ALBUMIN  --   --   --  3.7  --   --   --   --   --   --   --   --   CRP   < >  --   --  13.2*  --  9.5* 4.5* 1.8* 3.1*  --   --  7.0*  PROCALCITON  --   --  8.49 12.90  --  6.85  --   --   --   --   --  <0.10  LATICACIDVEN  --  1.7  --   --   --   --   --   --   --   --   --   --   BNP   < >  --   --  3,188.1*  --  1,702.3* 893.3* 1,265.8* 1,554.9* 1,081.0*  --   --   MG  --   --   --  2.3  --  2.3 2.1 1.9 1.9  --   --   --   CALCIUM   < >  --   --  9.7  --  9.3 8.9 8.8* 9.1 8.8* 9.1  --    < > = values in this interval not  displayed.      Radiology Reports DG Chest Port 1 View  Result Date: 11/21/2022 CLINICAL DATA:  61 year old female with history of shortness of breath. EXAM: PORTABLE CHEST 1 VIEW COMPARISON:  Chest x-ray 11/20/2022. FINDINGS: Lung volumes are normal. No consolidative airspace disease. Small bilateral pleural effusions (right greater than left). No pneumothorax. Cephalization of the pulmonary vasculature, without frank pulmonary edema. Mild cardiomegaly. Upper mediastinal contours are within normal limits. Atherosclerotic calcifications in the thoracic aorta. Status post median sternotomy for CABG. Left-sided pacemaker/AICD with lead tip projecting over the expected location of the right ventricle apex. Electronic device in the lateral right hemithorax with lead extending cephalad, potentially a deep brain stimulator. IMPRESSION: 1. Cardiomegaly with pulmonary venous congestion, but no frank pulmonary edema. 2. Small bilateral pleural effusions. 3. Aortic atherosclerosis. 4. Postoperative changes and support apparatus, as above. Electronically Signed   By: Vinnie Langton M.D.   On: 11/21/2022 06:44   DG Chest Port 1 View  Result Date: 11/20/2022 CLINICAL DATA:  Dyspnea EXAM: PORTABLE CHEST 1 VIEW COMPARISON:  Chest radiograph from one day prior. FINDINGS: Stable spinal stimulator with lead coursing superiorly into the right neck. Intact sternotomy wires. Stable configuration of single lead left subclavian ICD. Stable cardiomediastinal silhouette with top-normal heart size. No pneumothorax. No pleural effusion. Stable streaky mild bibasilar lung opacities. No pulmonary edema. IMPRESSION: Stable streaky mild bibasilar lung opacities, favor scarring or atelectasis. Otherwise no active cardiopulmonary disease. Electronically Signed   By: Ilona Sorrel M.D.   On: 11/20/2022 07:34   DG Chest Port 1 View  Result Date: 11/19/2022 CLINICAL DATA:  Shortness of breath EXAM: PORTABLE CHEST 1 VIEW COMPARISON:   Two days ago FINDINGS: Cardiomegaly. Right perihilar indistinctness with scar-like appearance by CT in the setting of prior radiotherapy. Vagal nerve stimulator on the right and single chamber defibrillator lead into the right ventricle on the left. Prior CABG. There is no edema, consolidation, effusion, or pneumothorax. IMPRESSION: Stable post treatment chest.  No evidence of acute disease. Electronically Signed   By: Jorje Guild M.D.   On: 11/19/2022 06:20   DG Abd Portable 1V  Result Date: 11/18/2022 CLINICAL DATA:  Nausea vomiting EXAM: PORTABLE ABDOMEN - 1 VIEW COMPARISON:  None Available. FINDINGS: The bowel gas pattern is normal. No radio-opaque calculi or other significant radiographic abnormality are seen. IMPRESSION: Negative. Electronically Signed   By: Sammie Bench M.D.   On: 11/18/2022 08:36      Signature  -   Lala Lund M.D on 11/21/2022 at 9:29 AM   -  To page go  to [email or web address]

## 2022-11-22 DIAGNOSIS — I5023 Acute on chronic systolic (congestive) heart failure: Secondary | ICD-10-CM

## 2022-11-22 DIAGNOSIS — J9621 Acute and chronic respiratory failure with hypoxia: Secondary | ICD-10-CM | POA: Diagnosis not present

## 2022-11-22 DIAGNOSIS — J09X1 Influenza due to identified novel influenza A virus with pneumonia: Secondary | ICD-10-CM | POA: Diagnosis not present

## 2022-11-22 LAB — CBC WITH DIFFERENTIAL/PLATELET
Abs Immature Granulocytes: 0.07 10*3/uL (ref 0.00–0.07)
Basophils Absolute: 0 10*3/uL (ref 0.0–0.1)
Basophils Relative: 0 %
Eosinophils Absolute: 0.3 10*3/uL (ref 0.0–0.5)
Eosinophils Relative: 3 %
HCT: 38.9 % (ref 36.0–46.0)
Hemoglobin: 12.4 g/dL (ref 12.0–15.0)
Immature Granulocytes: 1 %
Lymphocytes Relative: 11 %
Lymphs Abs: 1.1 10*3/uL (ref 0.7–4.0)
MCH: 29 pg (ref 26.0–34.0)
MCHC: 31.9 g/dL (ref 30.0–36.0)
MCV: 90.9 fL (ref 80.0–100.0)
Monocytes Absolute: 0.5 10*3/uL (ref 0.1–1.0)
Monocytes Relative: 5 %
Neutro Abs: 7.8 10*3/uL — ABNORMAL HIGH (ref 1.7–7.7)
Neutrophils Relative %: 80 %
Platelets: 209 10*3/uL (ref 150–400)
RBC: 4.28 MIL/uL (ref 3.87–5.11)
RDW: 18.6 % — ABNORMAL HIGH (ref 11.5–15.5)
WBC: 9.8 10*3/uL (ref 4.0–10.5)
nRBC: 0 % (ref 0.0–0.2)

## 2022-11-22 LAB — BASIC METABOLIC PANEL
Anion gap: 8 (ref 5–15)
BUN: 19 mg/dL (ref 8–23)
CO2: 25 mmol/L (ref 22–32)
Calcium: 9.3 mg/dL (ref 8.9–10.3)
Chloride: 107 mmol/L (ref 98–111)
Creatinine, Ser: 0.88 mg/dL (ref 0.44–1.00)
GFR, Estimated: >60 mL/min (ref >60)
Glucose, Bld: 127 mg/dL — ABNORMAL HIGH (ref 70–99)
Potassium: 3.8 mmol/L (ref 3.5–5.1)
Sodium: 140 mmol/L (ref 135–145)

## 2022-11-22 LAB — BRAIN NATRIURETIC PEPTIDE: B Natriuretic Peptide: 1130 pg/mL — ABNORMAL HIGH (ref 0.0–100.0)

## 2022-11-22 LAB — MAGNESIUM: Magnesium: 2 mg/dL (ref 1.7–2.4)

## 2022-11-22 LAB — PROCALCITONIN: Procalcitonin: <0.1 ng/mL

## 2022-11-22 LAB — C-REACTIVE PROTEIN: CRP: 7 mg/dL — ABNORMAL HIGH (ref <1.0)

## 2022-11-22 MED ORDER — APIXABAN 5 MG PO TABS
5.0000 mg | ORAL_TABLET | Freq: Two times a day (BID) | ORAL | Status: DC
  Administered 2022-11-22 – 2022-11-24 (×5): 5 mg via ORAL
  Filled 2022-11-22 (×5): qty 1

## 2022-11-22 MED ORDER — FUROSEMIDE 10 MG/ML IJ SOLN
40.0000 mg | Freq: Once | INTRAMUSCULAR | Status: AC
  Administered 2022-11-22: 40 mg via INTRAVENOUS
  Filled 2022-11-22: qty 4

## 2022-11-22 MED ORDER — POTASSIUM CHLORIDE CRYS ER 20 MEQ PO TBCR
20.0000 meq | EXTENDED_RELEASE_TABLET | Freq: Once | ORAL | Status: AC
  Administered 2022-11-22: 20 meq via ORAL
  Filled 2022-11-22: qty 1

## 2022-11-22 NOTE — Progress Notes (Addendum)
Advanced Heart Failure Rounding Note  PCP-Cardiologist: Dr. [name]   Subjective:    12/10: Empiric milrinone started  Still having intermittent dizziness.  Notes increased dyspnea when she attempted to stand up without supplemental O2. No dyspnea at rest. + cough.    Objective:   Weight Range: 47.1 kg Body mass index is 20.99 kg/m.   Vital Signs:   Temp:  [97.8 F (36.6 C)-99.3 F (37.4 C)] 97.8 F (36.6 C) (12/13 0732) Pulse Rate:  [66-83] 77 (12/13 0732) Resp:  [16-23] 20 (12/13 0732) BP: (99-123)/(44-60) 118/60 (12/13 0732) SpO2:  [90 %-99 %] 91 % (12/13 0732) Weight:  [47.1 kg] 47.1 kg (12/13 0523) Last BM Date : 11/19/22  Weight change: Filed Weights   11/21/22 0423 11/21/22 0556 11/22/22 0523  Weight: 47.1 kg 47.1 kg 47.1 kg    Intake/Output:   Intake/Output Summary (Last 24 hours) at 11/22/2022 0810 Last data filed at 11/22/2022 0600 Gross per 24 hour  Intake 909.03 ml  Output 600 ml  Net 309.03 ml      Physical Exam    General:  Fatigued appearing. HEENT: normal Neck: supple. JVP 8-9 cm. Carotids 2+ bilat; no bruits.  Cor: PMI nondisplaced. Regular rate & rhythm. No rubs, gallops or murmurs. Lungs: coarse Abdomen: soft, nontender, nondistended.  Extremities: no cyanosis, clubbing, rash, edema, + TED hose Neuro: alert & orientedx3, cranial nerves grossly intact. moves all 4 extremities w/o difficulty. Affect pleasant     Telemetry   SR 60s-80s, ~ 2 PVCs/min (personally reviewed)  Labs    CBC Recent Labs    11/20/22 0121 11/22/22 0053  WBC 7.8 9.8  NEUTROABS 7.1 7.8*  HGB 14.0 12.4  HCT 44.3 38.9  MCV 92.3 90.9  PLT 164 323   Basic Metabolic Panel Recent Labs    11/21/22 0048 11/22/22 0053  NA 137 140  K 3.7 3.8  CL 102 107  CO2 29 25  GLUCOSE 179* 127*  BUN 19 19  CREATININE 0.93 0.88  CALCIUM 9.1 9.3  MG  --  2.0   Liver Function Tests No results for input(s): "AST", "ALT", "ALKPHOS", "BILITOT", "PROT",  "ALBUMIN" in the last 72 hours.  No results for input(s): "LIPASE", "AMYLASE" in the last 72 hours. Cardiac Enzymes No results for input(s): "CKTOTAL", "CKMB", "CKMBINDEX", "TROPONINI" in the last 72 hours.  BNP: BNP (last 3 results) Recent Labs    11/19/22 0049 11/20/22 0121 11/22/22 0053  BNP 1,554.9* 1,081.0* 1,130.0*    ProBNP (last 3 results) No results for input(s): "PROBNP" in the last 8760 hours.   D-Dimer No results for input(s): "DDIMER" in the last 72 hours. Hemoglobin A1C No results for input(s): "HGBA1C" in the last 72 hours. Fasting Lipid Panel No results for input(s): "CHOL", "HDL", "LDLCALC", "TRIG", "CHOLHDL", "LDLDIRECT" in the last 72 hours. Thyroid Function Tests No results for input(s): "TSH", "T4TOTAL", "T3FREE", "THYROIDAB" in the last 72 hours.  Invalid input(s): "FREET3"  Other results:   Imaging    No results found.   Medications:     Scheduled Medications:  apixaban  5 mg Oral BID   atorvastatin  80 mg Oral Daily   dapagliflozin propanediol  10 mg Oral Daily   digoxin  0.125 mg Oral Daily   guaiFENesin  600 mg Oral BID   ivabradine  7.5 mg Oral BID WC   LORazepam  1 mg Oral Q0600   sodium chloride flush  3 mL Intravenous Q12H   spironolactone  25 mg Oral  Daily    Infusions:  milrinone 0.125 mcg/kg/min (11/21/22 1310)    PRN Medications: acetaminophen **OR** acetaminophen, albuterol, loperamide, naphazoline-pheniramine, nitroGLYCERIN, ondansetron (ZOFRAN) IV    Patient Profile   Brittany [name] is a 61 y.o.[place] Kitchen female with a history of PAF,  previous smoker quit 2015  years ago, hypertension, 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 Presented to the ED with worsening SOB, multifactorial from COPDE, CHF and found to be influenza +. AHF team asked to see for acute on chronic systolic heart failure.    Assessment/Plan   Acute hypoxic respiratory failure due to COPD w/ exacerbation, +influenza - tested + for influenza, WBC 13.3 on admit, completed abx and tamiflu -> WBC 9.9 - Now off Bipap>>supplemental O2 - Lung exam still coarse   2. Acute on chronic Systolic Heart Failure Due to ICM  - Has [employer] single chamber ICD  - Echo 07/10/22: EF 20-25%, global hypokinesis, GIIDD, RV systolic function, mildly reduced. RV size normal. Mild MR, trivial TR, aortic valve w/ mild-mod regurg.  - NYHA class IV - Appears mildly volume up. Give 40 mg lasix IV once today. Restart home po Torsemide tomorrow. - Empiric milrinone added 12/10 d/t concern for low-output. Stop today and watch closely - Continue digoxin 0.125mg  daily, dig level okay 12/09 - Continue Farxiga 10 mg daily.  - Continue spiro 25 mg daily. - Continue Corlanor 7.5 mg bid - Failed Entresto due to hyperkalemia.   - No b-blocker due to intolerance with low output and COPD with frequent flares - Not a candidate for advanced therapies with her degree of lung disease.   - S/p barostim  3. CAD  - Hx of NSTEMI/STEMI s/p Emergent CABG 02/03/18: Cath with severe 2v CAD as above with severe LV dysfunction/ICM.  - s/p Emergent CABG 02/03/18.   - Had NSTEMI and now s/p LHC 02/12/19 with DES to the ostial ramus extending into the distal left main.  - No longer on brillinta or ASA due to bleeding and need for [facility]. - Chest pain 6/10, persistent for the last 5 days (around the same time cough started) Doubt ischemic. HsTrop [phone].  EKG non acute  -> no further ischemic w/u indicated - Continue high-intensity statin. - No s/s angina  4. Dizziness - suspect vertigo - d/w hospitalist. Improving, Meclizine added. - Orthostatics negative - continue to work w/ PT/OT    5. Chronic Hypoxic Respiratory Failure  - Multifactorial, former smoker, h/o small cell lung cancer treated with chemo, chest XRT, COPD and HF - She has home O2  ([employer]). - Treating as above   6. Recurrent pleural effusions s/p pleurodesis - Tiny effusion R base, not seen on repeat CXR   7. PAF - CHA2DS2/VASc is at least 4. (CHF, Vasc disease, HTN, Female).   - She has history of Afib RVR in the past in the setting of her Lung CA.  - Previously on Xarelto but stopped due to hemoptysis.  - Remains in NSR  - Started on Eliquis in 12/22 due to renal infarct. - Continue Eliquis 2.5 mg bid. No bleeding issues.   8. 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 - Remains on Eliquis.     8. H/o SCLC:  - s/p treatment 2015. Lost to f/u since 04/2015.  - Chest CT 02/19/18 with mass-like consolidation in R hilum concerning for recurrent tumor.  -  Repeat CT 03/27/2018 -No definite findings of locally recurrent tumor in the right lung. 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 Continued chest CT surveillance is advised in 3-6 months. - S/p thoracentesis 04/2018. Cytology negative for malignancy.  - CTA 11/19 stable scarring in the right hilum consistent with emphysema. Bronchoscopy on 11/15, no obvious source of bleeding   9. Nausea/vomiting - unclear etiology. KUB ok  - ? Low output HF. Started empiric milrinone at 0.125 on 12/10.  - seems to be better today. Stop milrinone and follow GOC: - DNR/DNI   Disposition: -  PT recommending CIR. She is adamant she wants discharge home. Not interested in inpatient rehab.  Length of Stay: 8  Kees Idrovo, [place], PA-C  11/22/2022, 8:10 AM  Advanced Heart Failure Team Pager [phone] (M-F; 7a - 5p)  Please contact [facility] for night-coverage after hours (5p -7a ) and weekends on [email or web address]

## 2022-11-22 NOTE — Progress Notes (Signed)
Occupational Therapy Treatment Patient Details Name: Brittany [name] MRN: [id number] DOB: 1961/01/28 Today's Date: 11/22/2022   History of present illness 61 y.o. female presented 11/14/22 with SOB. She was diagnosed with acute influenza pneumonia along with CHF exacerbation causing acute hypoxic respiratory failure. 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 Pt min A +2 for short distance ambulation to sink with chair follow. Pt needing multiple rest breaks during seated bathing/oral care tasks, frequently resting head on forearm and leaning to L side to prop self up on elbow. Pt with poor activity tolerance and decreased awareness of deficits overall. Pt presenting with impairments listed below, will follow acutely. Continue to recommend AIR at d/c as pt unsafe to go home without 24/7 assistance.    Recommendations for follow up therapy are one component of a multi-disciplinary discharge planning process, led by the attending physician.  Recommendations may be updated based on patient status, additional functional criteria and insurance authorization.    Follow Up Recommendations  Acute inpatient rehab (3hours/day)     Assistance Recommended at Discharge Frequent or constant Supervision/Assistance  Patient can return home with the following  A little help with walking and/or transfers;A little help with bathing/dressing/bathroom;Assistance with cooking/housework;Assist for transportation;Help with stairs or ramp for entrance   Equipment Recommendations  Other (comment);BSC/3in1 (RW)    Recommendations for Other Services Rehab consult    Precautions /  Restrictions Precautions Precautions: Fall Precaution Comments: watch SpO2/BP Restrictions Weight Bearing Restrictions: No       Mobility Bed Mobility Overal bed mobility: Needs Assistance Bed Mobility: Supine to Sit     Supine to sit: Mod assist, HOB elevated, +2 for safety/equipment     General bed mobility comments: Pt needing assistance to scoot L hip to EOB using bed pad with pt demonstrating slow but good initiation to raise trunk using bed rails and bring legs towards EOB.    Transfers Overall transfer level: Needs assistance Equipment used: 2 person hand held assist Transfers: Sit to/from Stand Sit to Stand: +2 safety/equipment, +2 physical assistance, Min assist                 Balance Overall balance assessment: Needs assistance Sitting-balance support: Feet supported, Single extremity supported, Bilateral upper extremity supported Sitting balance-Leahy Scale: Fair Sitting balance - Comments: UE support to sit statically with min guard for safety   Standing balance support: Bilateral upper extremity supported, During functional activity, Single extremity supported Standing balance-Leahy Scale: Poor Standing balance comment: UE support and external physical assist required                           ADL either performed or assessed with clinical judgement   ADL Overall ADL's : Needs assistance/impaired     Grooming: Minimal assistance Grooming Details (indicate cue type and reason): multiple seated rest breaks, props self up on forearms frequently         Upper Body Dressing : Sitting;Moderate assistance   Lower Body Dressing: Maximal assistance;Sitting/lateral leans   Toilet Transfer: Minimal assistance;+2 for physical assistance;Ambulation;Regular Toilet           Functional mobility during ADLs: Minimal  assistance;+2 for physical assistance      Extremity/Trunk Assessment Upper Extremity Assessment Upper Extremity Assessment:  Generalized weakness   Lower Extremity Assessment Lower Extremity Assessment: Generalized weakness        Vision   Vision Assessment?: No apparent visual deficits   Perception Perception Perception: Not tested   Praxis Praxis Praxis: Not tested    Cognition Arousal/Alertness: Lethargic Behavior During Therapy: Flat affect Overall Cognitive Status: No family/caregiver present to determine baseline cognitive functioning                                 General Comments: poor awareness of current deficits/capabilities, stating she can "get to the chair" and "wash up" by herself, although needing 2 person assist to stand        Exercises      Shoulder Instructions       General Comments see PT note for orthostatics    Pertinent Vitals/ Pain       Pain Assessment Pain Assessment: Faces Pain Score: 2  Faces Pain Scale: Hurts a little bit Pain Location: generalized Pain Descriptors / Indicators: Guarding, Grimacing Pain Intervention(s): Limited activity within patient's tolerance, Monitored during session, Repositioned  Home Living                                          Prior Functioning/Environment              Frequency  Min 2X/week        Progress Toward Goals  OT Goals(current goals can now be found in the care plan section)  Progress towards OT goals: Progressing toward goals (self limiting)  Acute Rehab OT Goals OT Goal Formulation: With patient Time For Goal Achievement: 11/30/22 Potential to Achieve Goals: Good ADL Goals Pt Will Perform Grooming: Independently;standing Pt Will Perform Lower Body Bathing: with modified independence;sit to/from stand Pt Will Perform Lower Body Dressing: with modified independence;sit to/from stand Pt Will Transfer to Toilet: Independently;ambulating;regular height toilet Pt Will Perform Toileting - Clothing Manipulation and hygiene: Independently;sit to/from stand Additional  ADL Goal #1: Pt will employ energy conservation and breathing techniques in ADLs and mobility.  Plan Discharge plan needs to be updated;Frequency remains appropriate    Co-evaluation    PT/OT/SLP Co-Evaluation/Treatment: Yes Reason for Co-Treatment: Complexity of the patient's impairments (multi-system involvement);For patient/therapist safety;To address functional/ADL transfers PT goals addressed during session: Balance;Mobility/safety with mobility OT goals addressed during session: ADL's and self-care      AM-PAC OT "6 Clicks" Daily Activity     Outcome Measure   Help from another person eating meals?: None Help from another person taking care of personal grooming?: A Little Help from another person toileting, which includes using toliet, bedpan, or urinal?: A Lot Help from another person bathing (including washing, rinsing, drying)?: A Lot Help from another person to put on and taking off regular upper body clothing?: A Lot Help from another person to put on and taking off regular lower body clothing?: A Lot 6 Click Score: 15    End of Session Equipment Utilized During Treatment: Gait belt;Oxygen  OT Visit Diagnosis: Muscle weakness (generalized) (M62.81);Unsteadiness on feet (R26.81);Other abnormalities of gait and mobility (R26.89)   Activity Tolerance Patient limited by fatigue   Patient Left in chair;with call bell/phone within reach;with chair alarm set   Nurse  Communication Mobility status        Time: 2233-6122 OT Time Calculation (min): 40 min  Charges: OT General Charges $OT Visit: 1 Visit OT Treatments $Self Care/Home Management : 8-22 mins  Renaye Rakers, OTD, OTR/L SecureChat Preferred Acute Rehab [phone]   Renaye Rakers [place] 11/22/2022, 2:30 PM

## 2022-11-22 NOTE — Progress Notes (Signed)
PROGRESS NOTE   Brittany [name]  FVC:[id number]    DOB: 24-Mar-1961    DOA: 11/14/2022  PCP: Traci Sermon, PA-C   I have briefly reviewed patients previous medical records in [facility].  Chief Complaint  Patient presents with   Shortness of Breath    Brief Narrative:   61 y.o. 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  She was diagnosed with acute influenza pneumonia along with CHF exacerbation causing acute hypoxic respiratory failure, she was placed on BiPAP and admitted to the hospital.     Assessment & Plan:  Active Problems:   Sepsis ([place])   Influenza A with pneumonia   Acute on chronic respiratory failure with hypoxia (HCC)   COPD exacerbation (HCC)   S/P CABG (coronary artery bypass graft)   Elevated troponin   Hypotension   Chronic HFrEF (heart failure with reduced ejection fraction) (HCC)   AF (paroxysmal atrial fibrillation) (HCC)   History of lung cancer    Sepsis (POA) secondary to influenza A with pneumonia, possibility of superimposed bacterial pneumonia:   Patient's sepsis pathophysiology has resolved she has finished treatment with Tamiflu and antibiotics for influenza A infection and bronchitis respectively.  Continue aggressive pulmonary toilet.  Initially on as needed BiPAP but now on nasal cannula oxygen and stable.  PTA, patient on 2 L/min [place] oxygen as needed.  Currently on baseline home oxygen at 2 L/min.  As per prior [facility] provider, anxiety contributing to her dyspnea/hypoxia and Ativan was added.  Again as per prior [facility] provider documentation, note she has persistently expressed her wishes to be DNR and does not want to be intubated if  she gets worse.  Final blood cultures x 2: Negative.   Acute on chronic respiratory failure with hypoxia COPD with exacerbation  -at baseline at home, on 2 L/min [place] oxygen as needed.  Required BiPAP initially on admission.  Now weaned down to 2 L/min  oxygen.  Acute respiratory failure secondary to influenza A + bacterial pneumonia and decompensated CHF.  Although acute respiratory failure has resolved, remains with significant DOE on minimal exertion even turning in bed per her report.  Chest x-ray 12/12: No frank pulmonary edema.  Small bilateral pleural effusions.  Acute on chronic systolic heart failure EF 20% with AICD.   Advanced heart failure cardiology consultation and follow-up appreciated.  Diuresed with IV Lasix per cardiology team.  Cardiology giving an additional dose of IV Lasix 40 mg today with plans to restart home torsemide tomorrow.  Milrinone drip being discontinued 12/13.  Continue digoxin 0.125 Mg daily, digoxin level okay on 12/9.  Continue Farxiga, spironolactone, Corlanor.  Failed Entresto due to hyperkalemia.  No beta-blockers due to intolerance with low output and COPD with frequent flares.  Per cardiology, not a candidate for advanced therapies with her degree of lung disease.  S/p Barostim.     Elevated troponin, CAD -  High-sensitivity troponin 152-> 294.  This is demand ischemia from hypoxia caused by problem stated above, troponin trend is flat and in non-ACS pattern, she is chest pain-free.  Cardiology team on board. Continue statin for secondary prevention.   PAF on chronic anticoagulation - CHA2DS2-VASc score equal to at least 4.  On Eliquis and digoxin. Continue.  Sinus rhythm on telemetry.   CKD stage IIIa - Stable   History of squamous cell lung cancer - Patient with prior treatment chemotherapy, chest radiation, and prophylactic brain radiation in 2015.   Hypokalemia.  Replaced.   Mild Nausea & some dizziness - stable exam, stable KUB, digoxin level  stable, ? BPPV added meclizine + Benzo, all antibiotics stopped question if it was side effects of antibiotics, also blood pressure slightly soft hence TED stockings added, continue PT OT.  Dizziness improving but not completely resolved.  No nausea now.  Continue as needed meclizine.  Vestibular PT ordered.  Orthostatics negative.  Body mass index is 20.99 kg/m.   DVT prophylaxis: Place TED hose Start: 11/19/22 0738.  Also on apixaban anticoagulation.   Code Status: DNR:  Family Communication: None at bedside. Disposition:  Status is: Inpatient Remains inpatient appropriate because: Ongoing dyspnea on minimal exertion.  Cardiology adjusting heart failure medications including IV Lasix today.  Patient declined CIR and insistent on discharging home.  Home pending improvement in respiratory status and cardiology clearance.     Consultants:   Cardiology PCCM  Procedures:     Antimicrobials:   As above   Subjective:  Patient reports that even turning in bed causes her to have dyspnea.  Also states that overnight for some reason she was off oxygen for about half hour and her oxygen saturations dropped in the 80s and she felt significantly dyspneic.  She also reported dyspnea when nurses were checking vitals/weights early this morning.  Objective:   Vitals:   11/22/22 0019 11/22/22 0022 11/22/22 0523 11/22/22 0732  BP: (!) 101/55  (!) 102/44 118/60  Pulse: 66  68 77  Resp: (!) 23 17 18 20   Temp: 98.2 F (36.8 C)  98.4 F (36.9 C) 97.8 F (36.6 C)  TempSrc: Oral  Oral Oral  SpO2: 96%  97% 91%  Weight:   47.1 kg   Height:        General exam: Middle-age female, looks older than stated age, lying comfortably propped up in bed without distress. Respiratory system: Scattered occasional wheezing and also left basal crackles.  [place] air entry.  No increased work of breathing at rest. Cardiovascular system: S1 & S2 heard, RRR. No JVD, murmurs, rubs, gallops or clicks. No pedal edema.   Telemetry personally reviewed: Sinus rhythm. Gastrointestinal system: Abdomen is nondistended, soft and nontender. No organomegaly or masses felt. Normal bowel sounds heard. Central nervous system: Alert and oriented. No focal neurological deficits. Extremities: Symmetric 5 x 5 power. Skin: No rashes, lesions or ulcers Psychiatry: Judgement and insight appear normal. Mood & affect flat.     Data Reviewed:   I have personally reviewed following labs and imaging studies   CBC: Recent Labs  Lab 11/19/22 0049 11/20/22 0121 11/22/22 0053  WBC 6.8 7.8 9.8  NEUTROABS 5.8 7.1 7.8*  HGB 13.3 14.0 12.4  HCT 40.7 44.3 38.9  MCV 90.6 92.3 90.9  PLT 173 164 453    Basic Metabolic Panel: Recent Labs  Lab 11/16/22 0139 11/17/22 0112 11/18/22 0031 11/19/22 0049 11/20/22 0121 11/21/22 0048 11/22/22 0053  NA 139 137 137 139 136 137 140  K 3.4* 4.2 3.7 4.8 4.4 3.7 3.8  CL 102 102 101 102 102 102 107  CO2 26 25 24 26 22 29 25   GLUCOSE 119* 178* 180* 191* 101* 179* 127*  BUN 28* 30* 33* 25* 21 19 19   CREATININE 1.18* 1.26* 1.38*  1.08* 0.97 0.93 0.88  CALCIUM 9.3 8.9 8.8* 9.1 8.8* 9.1 9.3  MG 2.3 2.1 1.9 1.9  --   --  2.0    Liver Function Tests: No results for input(s): "AST", "ALT", "ALKPHOS", "BILITOT", "PROT", "ALBUMIN" in the last 168 hours.  CBG: No results for input(s): "GLUCAP" in the last 168 hours.  Microbiology Studies:   Recent Results (from the past 240 hour(s))  Culture, blood (routine x 2)     Status: None   Collection Time: 11/14/22  7:28 AM   Specimen: BLOOD RIGHT FOREARM  Result Value Ref Range Status   Specimen Description BLOOD RIGHT FOREARM  Final   Special Requests   Final    BOTTLES DRAWN AEROBIC AND ANAEROBIC Blood Culture adequate volume   Culture   Final    NO GROWTH 5 DAYS Performed at [facility], [address], [place], [place] [address]    Report Status 11/19/2022 FINAL  Final  Culture, blood (routine x 2)     Status: None    Collection Time: 11/14/22  7:33 AM   Specimen: BLOOD LEFT HAND  Result Value Ref Range Status   Specimen Description BLOOD LEFT HAND  Final   Special Requests   Final    BOTTLES DRAWN AEROBIC ONLY Blood Culture adequate volume   Culture   Final    NO GROWTH 5 DAYS Performed at [facility], [place] [address] [place], [place] [address]    Report Status 11/19/2022 FINAL  Final  Resp Panel by RT-PCR (Flu A&B, Covid) Peripheral     Status: Abnormal   Collection Time: 11/14/22  7:35 AM   Specimen: Peripheral; Nasal Swab  Result Value Ref Range Status   SARS Coronavirus 2 by RT PCR NEGATIVE NEGATIVE Final    Comment: (NOTE) SARS-CoV-2 target nucleic acids are NOT DETECTED.  The SARS-CoV-2 RNA is generally detectable in upper respiratory specimens during the acute phase of infection. The lowest concentration of SARS-CoV-2 viral copies this assay can detect is 138 copies/mL. A negative result does not preclude SARS-Cov-2 infection and should not be used as the sole basis for treatment or other patient management decisions. A negative result may occur with  improper specimen collection/handling, submission of specimen other than nasopharyngeal swab, presence of viral mutation(s) within the areas targeted by this assay, and inadequate number of viral copies(<138 copies/mL). A negative result must be combined with clinical observations, patient history, and epidemiological information. The expected result is Negative.  Fact Sheet for Patients:  EntrepreneurPulse.com.au  Fact Sheet for Healthcare Providers:  IncredibleEmployment.be  This test is no t yet approved or cleared by the [place] FDA and  has been authorized for detection and/or diagnosis of SARS-CoV-2 by FDA under an Emergency Use Authorization (EUA). This EUA will remain  in effect (meaning this test can be used) for the duration of the COVID-19 declaration under Section 564(b)(1)  of the Act, 21 U.S.C.section 360bbb-3(b)(1), unless the authorization is terminated  or revoked sooner.       Influenza A by PCR POSITIVE (A) NEGATIVE Final   Influenza B by PCR NEGATIVE NEGATIVE Final    Comment: (NOTE) The Xpert Xpress SARS-CoV-2/FLU/RSV plus assay is intended as an aid in the diagnosis of influenza from Nasopharyngeal swab specimens and should not be used as a sole basis for treatment. Nasal washings and aspirates are unacceptable for Xpert Xpress SARS-CoV-2/FLU/RSV testing.  Fact Sheet for Patients: EntrepreneurPulse.com.au  Fact Sheet for Healthcare Providers: IncredibleEmployment.be  This test is not yet approved or cleared by the [place] and has been authorized for detection and/or diagnosis of SARS-CoV-2 by FDA under an Emergency Use Authorization (EUA). This EUA will remain in effect (meaning this test can be used) for the duration of the COVID-19 declaration under Section 564(b)(1) of the Act, 21 U.S.C. section 360bbb-3(b)(1), unless the authorization is terminated or revoked.  Performed at [facility], [place] [address], [place], [place] [address]   MRSA Next Gen by PCR, Nasal     Status: None   Collection Time: 11/15/22  5:43 AM   Specimen: Nasal Mucosa; Nasal Swab  Result Value Ref Range Status   MRSA by PCR Next Gen NOT DETECTED NOT DETECTED Final    Comment: (NOTE) The GeneXpert MRSA Assay (FDA approved for NASAL specimens only), is one component of a comprehensive MRSA colonization surveillance program. It is not intended to diagnose MRSA infection nor to guide or monitor treatment for MRSA infections. Test performance is not FDA approved in patients less than 74 years old. Performed at [facility], [place] [address], [place], [place] [address]     Radiology Studies:  DG Chest [place] 1 View  Result Date: 11/21/2022 CLINICAL DATA:  61 year old female with history of shortness of  breath. EXAM: PORTABLE CHEST 1 VIEW COMPARISON:  Chest x-ray 11/20/2022. FINDINGS: Lung volumes are normal. No consolidative airspace disease. Small bilateral pleural effusions (right greater than left). No pneumothorax. Cephalization of the pulmonary vasculature, without frank pulmonary edema. Mild cardiomegaly. Upper mediastinal contours are within normal limits. Atherosclerotic calcifications in the thoracic aorta. Status post median sternotomy for CABG. Left-sided pacemaker/AICD with lead tip projecting over the expected location of the right ventricle apex. Electronic device in the lateral right hemithorax with lead extending cephalad, potentially a deep brain stimulator. IMPRESSION: 1. Cardiomegaly with pulmonary venous congestion, but no frank pulmonary edema. 2. Small bilateral pleural effusions. 3. Aortic atherosclerosis. 4. Postoperative changes and support apparatus, as above. Electronically Signed   By: Vinnie Langton M.D.   On: 11/21/2022 06:44    Scheduled Meds:    apixaban  5 mg Oral BID   atorvastatin  80 mg Oral Daily   dapagliflozin propanediol  10 mg Oral Daily   digoxin  0.125 mg Oral Daily   guaiFENesin  600 mg Oral BID   ivabradine  7.5 mg Oral BID WC   LORazepam  1 mg Oral Q0600   sodium chloride flush  3 mL Intravenous Q12H   spironolactone  25 mg Oral Daily    Continuous Infusions:     LOS: 8 days     Vernell Leep, MD,  FACP, FHM, SFHM, [facility], [place]     To contact the attending provider between 7A-7P or the covering provider during after hours 7P-7A, please log into the web site [email or web address] and access using universal [place] password for that web site. If you do not have the password, please call the hospital operator.  11/22/2022, 10:58 AM

## 2022-11-22 NOTE — Progress Notes (Signed)
Inpatient Rehabilitation Admissions Coordinator   Noted patient adamant she wishes to discharge home. We will sign off at this time as per her preference.  Danne Baxter, RN, MSN Rehab Admissions Coordinator [phone] 11/22/2022 8:38 AM

## 2022-11-22 NOTE — Progress Notes (Signed)
Physical Therapy Treatment Patient Details Name: Brittany [name] MRN: [id number] DOB: 1961-01-07 Today's Date: 11/22/2022   History of Present Illness 61 y.o. female presented 11/14/22 with SOB. She was diagnosed with acute influenza pneumonia along with CHF exacerbation causing acute hypoxic respiratory failure. 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 She repeated that she would be able to care for herself at home but then would state "I am doing my best" and reported need for assistance throughout the session. She reported she could call on someone to help her at home as needed, but deferred to answer further questions in regards to who would assist and to what extent. Pt easily getting upset and raising voice to therapist in regards to asking questions on how she was feeling, discharge planning, and her reported dizziness. Pt appeared to experience dizziness the most with transitioning supine to sit R EOB and with R [employer] testing. The nystagmus was difficult to assess to determine which canal is potentially involved, but treated pt with x1 Epley maneuver to the R since this position exacerbated her symptoms the most. Will need to follow along and further assess her vestibular system as able. Pt self limiting attempts at further mobility, ambulating only ~7 ft with minAx2 and bil HHA. She is at high risk for falls and would benefit from intermittent-frequent assistance upon d/c home. Pt would benefit from therapy follow-up at a facility upon d/c if she plans to d/c home alone, but if the amount of assistance she needs at d/c can be arranged then  she could d/c home with HHPT follow up. Will continue to follow acutely. Updated equipment recs to account for DME she is currently requiring for a safer d/c home if she still opts to go home.    Vestibular Assessment - 11/22/22 0001       Vestibular Assessment   General Observation Pt reporting "the room spinning" around her intermittently, which appeared to occur when exiting the R side of the bed and with R Marye Round testing. She reports it has been gradually improving over the past few days.      Symptom Behavior   Subjective history of current problem Pt reports it feels like "the room is spinning" around her, but has been gradually improving. Pt not verbally responding to questions from therapist at times, thus limited assessment.    Type of Dizziness  Spinning    Duration of Dizziness <30 seconds   per what occurred during session today, pt deferred to answer this question   Symptom Nature Motion provoked    Aggravating Factors Supine to sit    Relieving Factors Head stationary    Progression of Symptoms Better      Oculomotor Exam   Oculomotor Alignment Normal    Spontaneous Absent    Gaze-induced  Absent    Smooth Pursuits Intact      Oculomotor Exam-Fixation Suppressed    Left Head Impulse --   pt would close eyes, thus unable to assess   Right Head Impulse --   pt would close eyes, thus unable to assess     Positional Testing  Dix-Hallpike Dix-Hallpike Right;Dix-Hallpike Left    Horizontal Canal Testing Horizontal Canal Right;Horizontal Canal Left      Dix-Hallpike Right   Dix-Hallpike Right Duration <30 seconds    Dix-Hallpike Right Symptoms Other (comment)   very mild and difficult to tell direction of nystagmus, but worsening of pt's symptoms noted     Dix-Hallpike Left   Dix-Hallpike Left Duration <15 seconds    Dix-Hallpike Left Symptoms No nystagmus;Other (comment)   pt did not describe this test as reproductive of her symptoms, instead she had difficulty  describing her difficulty with this test     Horizontal Canal Right   Horizontal Canal Right Duration 30 seconds    Horizontal Canal Right Symptoms Normal      Horizontal Canal Left   Horizontal Canal Left Duration 30 seconds    Horizontal Canal Left Symptoms Normal      Positional Sensitivities   Supine to Sitting Severe dizziness   R side of bed   Right Hallpike Moderate dizziness    Up from Right Hallpike Moderate dizziness      Orthostatics   BP supine (x 5 minutes) 105/61    BP sitting 113/60    BP standing (after 1 minute) 104/76              Recommendations for follow up therapy are one component of a multi-disciplinary discharge planning process, led by the attending physician.  Recommendations may be updated based on patient status, additional functional criteria and insurance authorization.  Follow Up Recommendations  Acute inpatient rehab (3hours/day)     Assistance Recommended at Discharge Intermittent Supervision/Assistance  Patient can return home with the following A little help with bathing/dressing/bathroom;Assistance with cooking/housework;Direct supervision/assist for medications management;Direct supervision/assist for financial management;Assist for transportation;Help with stairs or ramp for entrance;A lot of help with walking and/or transfers   Equipment Recommendations  Rolling walker (2 wheels);BSC/3in1;Wheelchair (measurements PT);Wheelchair cushion (measurements PT) (pending progress)    Recommendations for Other Services       Precautions / Restrictions Precautions Precautions: Fall Precaution Comments: watch SpO2/BP Restrictions Weight Bearing Restrictions: No     Mobility  Bed Mobility Overal bed mobility: Needs Assistance Bed Mobility: Supine to Sit     Supine to sit: Mod assist, HOB elevated, +2 for safety/equipment     General bed mobility comments: Pt needing assistance to scoot L hip to EOB using bed pad with pt  demonstrating slow but good initiation to raise trunk using bed rails and bring legs towards EOB.    Transfers Overall transfer level: Needs assistance Equipment used: 2 person hand held assist Transfers: Sit to/from Stand Sit to Stand: +2 safety/equipment, +2 physical assistance, Min assist           General transfer comment: Extra time to initiate and power up to stand, reaching for therapist's hands bil for support. Pt maintaining an anterior and L lateral flexed posture.    Ambulation/Gait Ambulation/Gait assistance: Min assist, +2 physical assistance, +2 safety/equipment Gait Distance (Feet): 7 Feet Assistive device: 2 person hand held assist Gait Pattern/deviations: Step-through pattern, Decreased stride length, Shuffle, Trunk flexed Gait velocity: reduced Gait velocity interpretation: <1.31 ft/sec, indicative of household ambulator   General Gait Details: Pt with very slow, unsteady gait while maintaining an anterior and L laterally flexed trunk. Pt shuffling her feet and relying on bil HHA to ambulate to sink before trying to sit prematurely.   Stairs             Emergency planning/management officer  Modified Rankin (Stroke Patients Only)       Balance Overall balance assessment: Needs assistance Sitting-balance support: Feet supported, Single extremity supported, Bilateral upper extremity supported Sitting balance-Leahy Scale: Fair Sitting balance - Comments: UE support to sit statically with min guard for safety   Standing balance support: Bilateral upper extremity supported, During functional activity, Single extremity supported Standing balance-Leahy Scale: Poor Standing balance comment: UE support and external physical assist required                            Cognition Arousal/Alertness: Lethargic Behavior During Therapy: Flat affect Overall Cognitive Status: No family/caregiver present to determine baseline cognitive functioning                                  General Comments: Pt with poor insight into her current deficits, stating she could wash herself but then stating she needed help to stand. Poor insight into her safety, believing she could do what she needs to once home even though she could not do it currently. Pt easily upset and raising voice at therapist when therapist would ask how she was feeling etc. Slow processing throughout        Exercises      General Comments General comments (skin integrity, edema, etc.): VSS on 1L O2; BP 105/61 supine, 113/60 sitting, 104/76 standing      Pertinent Vitals/Pain Pain Assessment Pain Assessment: Faces Faces Pain Scale: Hurts a little bit Pain Location: generalized Pain Descriptors / Indicators: Guarding, Grimacing Pain Intervention(s): Limited activity within patient's tolerance, Monitored during session, Repositioned    Home Living                          Prior Function            PT Goals (current goals can now be found in the care plan section) Acute Rehab PT Goals Patient Stated Goal: to wash herself PT Goal Formulation: With patient Time For Goal Achievement: 11/29/22 Potential to Achieve Goals: Good Progress towards PT goals: Progressing toward goals    Frequency    Min 3X/week      PT Plan Equipment recommendations need to be updated    Co-evaluation PT/OT/SLP Co-Evaluation/Treatment: Yes Reason for Co-Treatment: For patient/therapist safety;To address functional/ADL transfers;Necessary to address cognition/behavior during functional activity PT goals addressed during session: Balance;Mobility/safety with mobility        AM-PAC PT "6 Clicks" Mobility   Outcome Measure  Help needed turning from your back to your side while in a flat bed without using bedrails?: A Little Help needed moving from lying on your back to sitting on the side of a flat bed without using bedrails?: A Lot Help needed moving to and from a bed to  a chair (including a wheelchair)?: A Lot Help needed standing up from a chair using your arms (e.g., wheelchair or bedside chair)?: A Lot Help needed to walk in hospital room?: Total Help needed climbing 3-5 steps with a railing? : Total 6 Click Score: 11    End of Session Equipment Utilized During Treatment: Oxygen;Gait belt Activity Tolerance: Patient limited by fatigue;Patient limited by lethargy Patient left: in chair;with call bell/phone within reach;with chair alarm set Nurse Communication: Mobility status;Other (comment) (pt set up to wash self in recliner with alarm on) PT Visit Diagnosis: Muscle weakness (generalized) (M62.81);Other  abnormalities of gait and mobility (R26.89);Unsteadiness on feet (R26.81);Difficulty in walking, not elsewhere classified (R26.2)     Time: 5859-2924 PT Time Calculation (min) (ACUTE ONLY): 41 min  Charges:  $Therapeutic Activity: 8-22 mins $Canalith Rep Proc: 8-22 mins                     Moishe Spice, PT, DPT Acute Rehabilitation Services  Office: [phone]    Orvan Falconer 11/22/2022, 2:02 PM

## 2022-11-23 DIAGNOSIS — J9621 Acute and chronic respiratory failure with hypoxia: Secondary | ICD-10-CM | POA: Diagnosis not present

## 2022-11-23 DIAGNOSIS — J09X1 Influenza due to identified novel influenza A virus with pneumonia: Secondary | ICD-10-CM | POA: Diagnosis not present

## 2022-11-23 DIAGNOSIS — I5023 Acute on chronic systolic (congestive) heart failure: Secondary | ICD-10-CM | POA: Diagnosis not present

## 2022-11-23 LAB — CBC WITH DIFFERENTIAL/PLATELET
Abs Immature Granulocytes: 0.08 10*3/uL — ABNORMAL HIGH (ref 0.00–0.07)
Basophils Absolute: 0 10*3/uL (ref 0.0–0.1)
Basophils Relative: 0 %
Eosinophils Absolute: 0.3 10*3/uL (ref 0.0–0.5)
Eosinophils Relative: 3 %
HCT: 38.6 % (ref 36.0–46.0)
Hemoglobin: 12.5 g/dL (ref 12.0–15.0)
Immature Granulocytes: 1 %
Lymphocytes Relative: 13 %
Lymphs Abs: 1.3 10*3/uL (ref 0.7–4.0)
MCH: 29 pg (ref 26.0–34.0)
MCHC: 32.4 g/dL (ref 30.0–36.0)
MCV: 89.6 fL (ref 80.0–100.0)
Monocytes Absolute: 0.6 10*3/uL (ref 0.1–1.0)
Monocytes Relative: 6 %
Neutro Abs: 7.5 10*3/uL (ref 1.7–7.7)
Neutrophils Relative %: 77 %
Platelets: 239 10*3/uL (ref 150–400)
RBC: 4.31 MIL/uL (ref 3.87–5.11)
RDW: 18.7 % — ABNORMAL HIGH (ref 11.5–15.5)
WBC: 9.8 10*3/uL (ref 4.0–10.5)
nRBC: 0 % (ref 0.0–0.2)

## 2022-11-23 LAB — MAGNESIUM: Magnesium: 2.2 mg/dL (ref 1.7–2.4)

## 2022-11-23 LAB — BASIC METABOLIC PANEL
Anion gap: 8 (ref 5–15)
BUN: 16 mg/dL (ref 8–23)
CO2: 25 mmol/L (ref 22–32)
Calcium: 9.1 mg/dL (ref 8.9–10.3)
Chloride: 107 mmol/L (ref 98–111)
Creatinine, Ser: 1.08 mg/dL — ABNORMAL HIGH (ref 0.44–1.00)
GFR, Estimated: 58 mL/min — ABNORMAL LOW (ref >60)
Glucose, Bld: 163 mg/dL — ABNORMAL HIGH (ref 70–99)
Potassium: 4 mmol/L (ref 3.5–5.1)
Sodium: 140 mmol/L (ref 135–145)

## 2022-11-23 LAB — BRAIN NATRIURETIC PEPTIDE: B Natriuretic Peptide: 1271.8 pg/mL — ABNORMAL HIGH (ref 0.0–100.0)

## 2022-11-23 LAB — C-REACTIVE PROTEIN: CRP: 6.5 mg/dL — ABNORMAL HIGH (ref <1.0)

## 2022-11-23 LAB — PROCALCITONIN: Procalcitonin: <0.1 ng/mL

## 2022-11-23 MED ORDER — TORSEMIDE 20 MG PO TABS
60.0000 mg | ORAL_TABLET | Freq: Every day | ORAL | Status: DC
  Administered 2022-11-23 – 2022-11-24 (×2): 60 mg via ORAL
  Filled 2022-11-23 (×2): qty 3

## 2022-11-23 NOTE — Progress Notes (Signed)
   11/23/22 1553  Mobility  Activity Ambulated with assistance in hallway  Level of Assistance Contact guard assist, steadying assist  Assistive Device Four wheel walker  Distance Ambulated (ft) 300 ft  Activity Response Tolerated well  Mobility Referral Yes  $Mobility charge 1 Mobility   Mobility Specialist Progress Note  During Mobility: 103 HR, 90% SpO2 (RA) Post-Mobility: 88 HR, 129/55 BP, 93% SpO2(RA)  Pt was in bed and agreeable. X4 seated breaks(2-3 minutes each) d/t fatigue. Returned to bed w/ all needs met and call bell in reach  Wall Specialist  Please contact via Solicitor or Rehab office at [phone]

## 2022-11-23 NOTE — Progress Notes (Signed)
PROGRESS NOTE   Brittany [name]  SMO:[id number]    DOB: 03-09-1961    DOA: 11/14/2022  PCP: Traci Sermon, PA-C   I have briefly reviewed patients previous medical records in [facility].  Chief Complaint  Patient presents with   Shortness of Breath    Brief Narrative:   61 y.o. 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  She was diagnosed with acute influenza pneumonia along with CHF exacerbation causing acute hypoxic respiratory failure, she was placed on BiPAP and admitted to the hospital.     Assessment & Plan:  Active Problems:   Sepsis ([place])   Influenza A with pneumonia   Acute on chronic respiratory failure with hypoxia (HCC)   COPD exacerbation (HCC)   S/P CABG (coronary artery bypass graft)   Elevated troponin   Hypotension   Chronic HFrEF (heart failure with reduced ejection fraction) (HCC)   AF (paroxysmal atrial fibrillation) (HCC)   History of lung cancer    Sepsis (POA) secondary to influenza A with pneumonia, possibility of superimposed bacterial pneumonia:   Patient's sepsis pathophysiology has resolved she has finished treatment with Tamiflu and antibiotics for influenza A infection and bronchitis respectively.  Continue aggressive pulmonary toilet.  Initially on as needed BiPAP but now on nasal cannula oxygen and stable.  PTA, patient on 2 L/min [place] oxygen as needed.  Currently on baseline home oxygen at 2 L/min.  As per prior [facility] provider, anxiety contributing to her dyspnea/hypoxia and Ativan was added.  Again as per prior [facility] provider documentation, note she has persistently expressed her wishes to be DNR and does not want to be intubated if  she gets worse.  Final blood cultures x 2: Negative.   Acute on chronic respiratory failure with hypoxia COPD with exacerbation  -at baseline at home, on 2 L/min [place] oxygen as needed.  Required BiPAP initially on admission.  Now weaned down to 2 L/min [place] oxygen which is her home level of O2.  Acute respiratory failure secondary to influenza A + bacterial pneumonia and decompensated CHF.  Although acute respiratory failure has resolved, remains with significant DOE on minimal exertion even turning in bed per her report.  Chest x-ray 12/12: No frank pulmonary edema.  Small bilateral pleural effusions.  Acute on chronic systolic heart failure EF 20% with AICD.   Advanced heart failure cardiology consultation and follow-up appreciated.  Diuresed with IV Lasix per cardiology team.  Cardiology giving an additional dose of IV Lasix 40 mg 12/13 with plans to restart home torsemide today.  Milrinone drip being discontinued 12/13.  Continue digoxin 0.125 Mg daily, digoxin level okay on 12/9.  Continue Farxiga, spironolactone, Corlanor.  Failed Entresto due to hyperkalemia.  No beta-blockers due to intolerance with low output and COPD with frequent flares.  Per cardiology, not a candidate for advanced therapies with her degree of lung disease.  S/p Barostim.  -1250 yesterday.  Clinically appears euvolemic.  BNP one 271.8.  Unclear why trending daily BMP, CRP and procalcitonin-discontinued.  Await cardiology follow-up to see if they cleared her for discharge home.  Patient vehemently declines CIR and insist that she wants to go home where she lives alone but states that her  sister and a friend live close by, can come by and stay with her for a couple of days and can assist her as needed.  She also states that the EMS is "2 minutes away".   Elevated troponin, CAD -  High-sensitivity troponin 152-> 294.  This is demand ischemia from hypoxia caused by problem stated above, troponin trend is flat and in non-ACS pattern, she  is chest pain-free.  Cardiology team on board. Continue statin for secondary prevention.   PAF on chronic anticoagulation - CHA2DS2-VASc score equal to at least 4.   On Eliquis and digoxin. Continue.  Sinus rhythm on telemetry.   CKD stage IIIa - Stable   History of squamous cell lung cancer - Patient with prior treatment chemotherapy, chest radiation, and prophylactic brain radiation in 2015.   Hypokalemia.  Replaced.   Mild Nausea & some dizziness - stable exam, stable KUB, digoxin level stable, ? BPPV added meclizine + Benzo, all antibiotics stopped question if it was side effects of antibiotics, also blood pressure slightly soft hence TED stockings added, continue PT OT.  Dizziness improving but not completely resolved.  No nausea now.  Continue as needed meclizine.  Vestibular PT input appreciated.  Orthostatics negative.  Reports that her dizziness has now resolved.  Body mass index is 21.6 kg/m.   DVT prophylaxis: Place TED hose Start: 11/19/22 0738.  Also on apixaban anticoagulation.   Code Status: DNR:  Family Communication: None at bedside. Disposition:  Status is: Inpatient Remains inpatient appropriate because: Awaiting on cardiology clearance for DC home.     Consultants:   Cardiology PCCM  Procedures:     Antimicrobials:   As above   Subjective:  States that her breathing is much better.  Indicates that it is approaching baseline.  Also states that she does not any more dizziness.  As per nursing, patient has been irritable for the last 2 days, not participating in mobilization.  She was reportedly up in chair for about 10 minutes and then wanted to go back to bed.  Objective:   Vitals:   11/22/22 2004 11/23/22 0015 11/23/22 0545 11/23/22 0739  BP: 119/60 107/65 (!) 97/57 (!) 98/57  Pulse: 67 69 67 69  Resp: 17 18 19 18   Temp: 98.4 F (36.9 C) 98.3 F (36.8 C) 98.9 F (37.2 C) 98.8 F (37.1 C)  TempSrc: Oral Oral Oral Oral  SpO2: 98% 100% 100% 100%   Weight:   48.5 kg   Height:        General exam: Middle-age female, looks older than stated age, lying comfortably supine in bed without distress. Respiratory system: Occasional transmitted upper airway wheezing but otherwise clear to auscultation without rhonchi or crackles.  No increased work of breathing. Cardiovascular system: S1 & S2 heard, RRR. No JVD, murmurs, rubs, gallops or clicks. No pedal edema.  Telemetry personally reviewed: Sinus rhythm. Gastrointestinal system: Abdomen is nondistended, soft and nontender. No organomegaly or masses felt. Normal bowel sounds heard. Central nervous system: Alert and oriented. No focal neurological deficits. Extremities: Symmetric 5 x 5 power. Skin: No rashes, lesions or ulcers Psychiatry: Judgement and insight appear normal. Mood & affect flat.     Data Reviewed:   I have personally reviewed following labs and imaging studies   CBC: Recent Labs  Lab 11/20/22 0121 11/22/22 0053 11/23/22 0046  WBC 7.8 9.8 9.8  NEUTROABS 7.1 7.8* 7.5  HGB 14.0 12.4 12.5  HCT 44.3 38.9 38.6  MCV 92.3 90.9 89.6  PLT  164 209 161    Basic Metabolic Panel: Recent Labs  Lab 11/17/22 0112 11/18/22 0031 11/19/22 0049 11/20/22 0121 11/21/22 0048 11/22/22 0053 11/23/22 0046  NA 137 137 139 136 137 140 140  K 4.2 3.7 4.8 4.4 3.7 3.8 4.0  CL 102 101 102 102 102 107 107  CO2 25 24 26 22 29 25 25   GLUCOSE 178* 180* 191* 101* 179* 127* 163*  BUN 30* 33* 25* 21 19 19 16   CREATININE 1.26* 1.38* 1.08* 0.97 0.93 0.88 1.08*  CALCIUM 8.9 8.8* 9.1 8.8* 9.1 9.3 9.1  MG 2.1 1.9 1.9  --   --  2.0 2.2    Liver Function Tests: No results for input(s): "AST", "ALT", "ALKPHOS", "BILITOT", "PROT", "ALBUMIN" in the last 168 hours.  CBG: No results for input(s): "GLUCAP" in the last 168 hours.  Microbiology Studies:   Recent Results (from the past 240 hour(s))  Culture, blood (routine x 2)     Status: None   Collection Time: 11/14/22  7:28 AM   Specimen:  BLOOD RIGHT FOREARM  Result Value Ref Range Status   Specimen Description BLOOD RIGHT FOREARM  Final   Special Requests   Final    BOTTLES DRAWN AEROBIC AND ANAEROBIC Blood Culture adequate volume   Culture   Final    NO GROWTH 5 DAYS Performed at [facility], [address], [place], [place] [address]    Report Status 11/19/2022 FINAL  Final  Culture, blood (routine x 2)     Status: None   Collection Time: 11/14/22  7:33 AM   Specimen: BLOOD LEFT HAND  Result Value Ref Range Status   Specimen Description BLOOD LEFT HAND  Final   Special Requests   Final    BOTTLES DRAWN AEROBIC ONLY Blood Culture adequate volume   Culture   Final    NO GROWTH 5 DAYS Performed at [facility], [place] [address], [place], [place] [address]    Report Status 11/19/2022 FINAL  Final  Resp Panel by RT-PCR (Flu A&B, Covid) Peripheral     Status: Abnormal   Collection Time: 11/14/22  7:35 AM   Specimen: Peripheral; Nasal Swab  Result Value Ref Range Status   SARS Coronavirus 2 by RT PCR NEGATIVE NEGATIVE Final    Comment: (NOTE) SARS-CoV-2 target nucleic acids are NOT DETECTED.  The SARS-CoV-2 RNA is generally detectable in upper respiratory specimens during the acute phase of infection. The lowest concentration of SARS-CoV-2 viral copies this assay can detect is 138 copies/mL. A negative result does not preclude SARS-Cov-2 infection and should not be used as the sole basis for treatment or other patient management decisions. A negative result may occur with  improper specimen collection/handling, submission of specimen other than nasopharyngeal swab, presence of viral mutation(s) within the areas targeted by this assay, and inadequate number of viral copies(<138 copies/mL). A negative result must be combined with clinical observations, patient history, and epidemiological information. The expected result is Negative.  Fact Sheet for Patients:   EntrepreneurPulse.com.au  Fact Sheet for Healthcare Providers:  IncredibleEmployment.be  This test is no t yet approved or cleared by the [place] FDA and  has been authorized for detection and/or diagnosis of SARS-CoV-2 by FDA under an Emergency Use Authorization (EUA). This EUA will remain  in effect (meaning this test can be used) for the duration of the COVID-19 declaration under Section 564(b)(1) of the Act, 21 U.S.C.section 360bbb-3(b)(1), unless the authorization is terminated  or  revoked sooner.       Influenza A by PCR POSITIVE (A) NEGATIVE Final   Influenza B by PCR NEGATIVE NEGATIVE Final    Comment: (NOTE) The Xpert Xpress SARS-CoV-2/FLU/RSV plus assay is intended as an aid in the diagnosis of influenza from Nasopharyngeal swab specimens and should not be used as a sole basis for treatment. Nasal washings and aspirates are unacceptable for Xpert Xpress SARS-CoV-2/FLU/RSV testing.  Fact Sheet for Patients: EntrepreneurPulse.com.au  Fact Sheet for Healthcare Providers: IncredibleEmployment.be  This test is not yet approved or cleared by the [place] FDA and has been authorized for detection and/or diagnosis of SARS-CoV-2 by FDA under an Emergency Use Authorization (EUA). This EUA will remain in effect (meaning this test can be used) for the duration of the COVID-19 declaration under Section 564(b)(1) of the Act, 21 U.S.C. section 360bbb-3(b)(1), unless the authorization is terminated or revoked.  Performed at [facility], [place] [address], [place], [place] [address]   MRSA Next Gen by PCR, Nasal     Status: None   Collection Time: 11/15/22  5:43 AM   Specimen: Nasal Mucosa; Nasal Swab  Result Value Ref Range Status   MRSA by PCR Next Gen NOT DETECTED NOT DETECTED Final    Comment: (NOTE) The GeneXpert MRSA Assay (FDA approved for NASAL specimens only), is one component of a  comprehensive MRSA colonization surveillance program. It is not intended to diagnose MRSA infection nor to guide or monitor treatment for MRSA infections. Test performance is not FDA approved in patients less than 18 years old. Performed at [facility], [place] [address] [place], [place] [address]     Radiology Studies:  No results found.  Scheduled Meds:    apixaban  5 mg Oral BID   atorvastatin  80 mg Oral Daily   dapagliflozin propanediol  10 mg Oral Daily   digoxin  0.125 mg Oral Daily   guaiFENesin  600 mg Oral BID   ivabradine  7.5 mg Oral BID WC   LORazepam  1 mg Oral Q0600   sodium chloride flush  3 mL Intravenous Q12H   spironolactone  25 mg Oral Daily    Continuous Infusions:     LOS: 9 days     Vernell Leep, MD,  FACP, FHM, SFHM, [facility], [place]     To contact the attending provider between 7A-7P or the covering provider during after hours 7P-7A, please log into the web site [email or web address] and access using universal [place] password for that web site. If you do not have the password, please call the hospital operator.  11/23/2022, 9:18 AM

## 2022-11-23 NOTE — Progress Notes (Addendum)
Advanced Heart Failure Rounding Note  PCP-Cardiologist: Dr. [name]   Subjective:    12/10: Empiric milrinone started 12/13: Milrinone discontinued   1.3L in UOP yesterday w/ 40 mg IV Lasix. Only net negative 380 cc for the day. Feels much better. Breathing improved.   Scr 0.88>>1.08 BUN 16. CO2 25. K 4.0   BPs soft, upper 30Z systolic.   Still weak. Has been working w/ PT.   Objective:   Weight Range: 48.5 kg Body mass index is 21.6 kg/m.   Vital Signs:   Temp:  [98.1 F (36.7 C)-99.3 F (37.4 C)] 98.8 F (37.1 C) (12/14 0739) Pulse Rate:  [67-77] 69 (12/14 0739) Resp:  [17-20] 18 (12/14 0739) BP: (97-119)/(57-76) 98/57 (12/14 0739) SpO2:  [98 %-100 %] 100 % (12/14 0739) Weight:  [48.5 kg] 48.5 kg (12/14 0545) Last BM Date : 11/19/22  Weight change: Filed Weights   11/21/22 0556 11/22/22 0523 11/23/22 0545  Weight: 47.1 kg 47.1 kg 48.5 kg    Intake/Output:   Intake/Output Summary (Last 24 hours) at 11/23/2022 0859 Last data filed at 11/22/2022 2007 Gross per 24 hour  Intake 357 ml  Output 1150 ml  Net -793 ml      Physical Exam    General:  Fatigued appearing. NAD  HEENT: normal Neck: supple. JVD not elevated. Carotids 2+ bilat; no bruits.  Cor: PMI nondisplaced. Regular rate & rhythm. No rubs, gallops or murmurs. Lungs: clear (much improved) Abdomen: soft, nontender, nondistended.  Extremities: no cyanosis, clubbing, rash, no edema, + TED hose Neuro: alert & orientedx3, cranial nerves grossly intact. moves all 4 extremities w/o difficulty. Affect pleasant   Telemetry   SR 60s-80s (personally reviewed)  Labs    CBC Recent Labs    11/22/22 0053 11/23/22 0046  WBC 9.8 9.8  NEUTROABS 7.8* 7.5  HGB 12.4 12.5  HCT 38.9 38.6  MCV 90.9 89.6  PLT 209 601   Basic Metabolic Panel Recent Labs    11/22/22 0053 11/23/22 0046  NA 140 140  K 3.8 4.0  CL 107 107  CO2 25 25  GLUCOSE 127* 163*  BUN 19 16  CREATININE 0.88 1.08*   CALCIUM 9.3 9.1  MG 2.0 2.2   Liver Function Tests No results for input(s): "AST", "ALT", "ALKPHOS", "BILITOT", "PROT", "ALBUMIN" in the last 72 hours.  No results for input(s): "LIPASE", "AMYLASE" in the last 72 hours. Cardiac Enzymes No results for input(s): "CKTOTAL", "CKMB", "CKMBINDEX", "TROPONINI" in the last 72 hours.  BNP: BNP (last 3 results) Recent Labs    11/20/22 0121 11/22/22 0053 11/23/22 0046  BNP 1,081.0* 1,130.0* 1,271.8*    ProBNP (last 3 results) No results for input(s): "PROBNP" in the last 8760 hours.   D-Dimer No results for input(s): "DDIMER" in the last 72 hours. Hemoglobin A1C No results for input(s): "HGBA1C" in the last 72 hours. Fasting Lipid Panel No results for input(s): "CHOL", "HDL", "LDLCALC", "TRIG", "CHOLHDL", "LDLDIRECT" in the last 72 hours. Thyroid Function Tests No results for input(s): "TSH", "T4TOTAL", "T3FREE", "THYROIDAB" in the last 72 hours.  Invalid input(s): "FREET3"  Other results:   Imaging    No results found.   Medications:     Scheduled Medications:  apixaban  5 mg Oral BID   atorvastatin  80 mg Oral Daily   dapagliflozin propanediol  10 mg Oral Daily   digoxin  0.125 mg Oral Daily   guaiFENesin  600 mg Oral BID   ivabradine  7.5 mg Oral BID  WC   LORazepam  1 mg Oral Q0600   sodium chloride flush  3 mL Intravenous Q12H   spironolactone  25 mg Oral Daily    Infusions:    PRN Medications: acetaminophen **OR** acetaminophen, albuterol, loperamide, naphazoline-pheniramine, nitroGLYCERIN, ondansetron (ZOFRAN) IV    Patient Profile   Brittany [name] is a 61 y.o.[place] female with a history of PAF,  previous smoker quit 2015  years ago, hypertension, 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 Presented to the ED with worsening SOB, multifactorial from COPDE, CHF and found to be influenza +.  AHF team asked to see for acute on chronic systolic heart failure.   Assessment/Plan   Acute hypoxic respiratory failure due to COPD w/ exacerbation, +influenza - tested + for influenza, WBC 13.3 on admit, completed abx and tamiflu -> WBC 9.9 - Now off Bipap>>supplemental O2 - Lung exam much improved    2. Acute on chronic Systolic Heart Failure Due to ICM  - Has [employer] single chamber ICD  - Echo 07/10/22: EF 20-25%, global hypokinesis, GIIDD, RV systolic function, mildly reduced. RV size normal. Mild MR, trivial TR, aortic valve w/ mild-mod regurg.  - NYHA class IV on admit  - Empiric milrinone added 12/10 d/t concern for low-output. Milrinone stopped 12/10.  - now well diuresed. Breathing improved. SCr ok  - transition back torsemide 60 mg daily  - Continue digoxin 0.125mg  daily, dig level okay 12/09 - Continue Farxiga 10 mg daily.  - Continue spiro 25 mg daily. - Continue Corlanor 7.5 mg bid - Failed Entresto due to hyperkalemia.   - No b-blocker due to intolerance with low output and COPD with frequent flares - Not a candidate for advanced therapies with her degree of lung disease.   - S/p barostim  3. CAD  - Hx of NSTEMI/STEMI s/p Emergent CABG 02/03/18: Cath with severe 2v CAD as above with severe LV dysfunction/ICM.  - s/p Emergent CABG 02/03/18.   - Had NSTEMI and now s/p LHC 02/12/19 with DES to the ostial ramus extending into the distal left main.  - No longer on brillinta or ASA due to bleeding and need for [facility]. - Chest pain 6/10, persistent for the last 5 days (around the same time cough started) Doubt ischemic. HsTrop [phone].  EKG non acute  -> no further ischemic w/u indicated - Continue high-intensity statin. - No s/s angina  4. Dizziness - suspect vertigo - d/w hospitalist. Improving, Meclizine added. - Orthostatics negative - continue to work w/ PT/OT    5. Chronic Hypoxic Respiratory Failure  - Multifactorial, former smoker, h/o small cell lung cancer  treated with chemo, chest XRT, COPD and HF - She has home O2 ([employer]). - Treating as above   6. Recurrent pleural effusions s/p pleurodesis - Tiny effusion R base, not seen on repeat CXR   7. PAF - CHA2DS2/VASc is at least 4. (CHF, Vasc disease, HTN, Female).   - She has history of Afib RVR in the past in the setting of her Lung CA.  - Previously on Xarelto but stopped due to hemoptysis.  - Remains in NSR  - Started on Eliquis in 12/22 due to renal infarct. - Continue Eliquis 2.5 mg bid. No bleeding issues.   8. 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 - Remains on Eliquis.     8. H/o SCLC:  - s/p  treatment 2015. Lost to f/u since 04/2015.  - Chest CT 02/19/18 with mass-like consolidation in R hilum concerning for recurrent tumor.  - Repeat CT 03/27/2018 -No definite findings of locally recurrent tumor in the right lung. 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 Continued chest CT surveillance is advised in 3-6 months. - S/p thoracentesis 04/2018. Cytology negative for malignancy.  - CTA 11/19 stable scarring in the right hilum consistent with emphysema. Bronchoscopy on 11/15, no obvious source of bleeding   9. Nausea/vomiting - unclear etiology. KUB ok  - ? Low output HF. Started empiric milrinone at 0.125 on 12/10. - much improved.  Now off milrinone    GOC: - DNR/DNI  Disposition: -  PT recommending CIR. She is adamant she wants discharge home. Not interested in inpatient rehab.  Possible d/c home tomorrow if volume status ok w/ PO diuretics.   Length of Stay: [address], PA-C  11/23/2022, 8:59 AM  Advanced Heart Failure Team Pager [phone] (M-F; 7a - 5p)  Please contact [facility] for night-coverage after hours (5p -7a ) and weekends on [email or web address]  Patient seen and examined with the above-signed Advanced Practice Provider and/or Housestaff. I personally reviewed  laboratory data, imaging studies and relevant notes. I independently examined the patient and formulated the important aspects of the plan. I have edited the note to reflect any of my changes or salient points. I have personally discussed the plan with the patient and/or family.  Off milrinone. Got IV lasix yesterday. Breathing now improved. No further wheezing. Dizziness resolved  General:  Weak appearing. No resp difficulty HEENT: normal Neck: supple. no JVD. Carotids 2+ bilat; no bruits. No lymphadenopathy or thryomegaly appreciated. Cor: PMI nondisplaced. Regular rate & rhythm. No rubs, gallops or murmurs. Lungs: clear Abdomen: soft, nontender, nondistended. No hepatosplenomegaly. No bruits or masses. Good bowel sounds. Extremities: no cyanosis, clubbing, rash, edema Neuro: alert & orientedx3, cranial nerves grossly intact. moves all 4 extremities w/o difficulty. Affect pleasant  Volume and respiratory status much improved. Respiratory infection seems to be clearing. Wil get her back on home HF meds. Likely home tomorrow from our standpoint. I discussed CIR with her but she refused again.   Glori Bickers, MD  9:56 AM

## 2022-11-24 DIAGNOSIS — J1100 Influenza due to unidentified influenza virus with unspecified type of pneumonia: Secondary | ICD-10-CM

## 2022-11-24 DIAGNOSIS — I5023 Acute on chronic systolic (congestive) heart failure: Secondary | ICD-10-CM | POA: Diagnosis not present

## 2022-11-24 DIAGNOSIS — J9621 Acute and chronic respiratory failure with hypoxia: Secondary | ICD-10-CM | POA: Diagnosis not present

## 2022-11-24 LAB — MAGNESIUM: Magnesium: 2 mg/dL (ref 1.7–2.4)

## 2022-11-24 LAB — BASIC METABOLIC PANEL
Anion gap: 10 (ref 5–15)
BUN: 18 mg/dL (ref 8–23)
CO2: 27 mmol/L (ref 22–32)
Calcium: 9.1 mg/dL (ref 8.9–10.3)
Chloride: 101 mmol/L (ref 98–111)
Creatinine, Ser: 1.16 mg/dL — ABNORMAL HIGH (ref 0.44–1.00)
GFR, Estimated: 53 mL/min — ABNORMAL LOW (ref >60)
Glucose, Bld: 137 mg/dL — ABNORMAL HIGH (ref 70–99)
Potassium: 3.7 mmol/L (ref 3.5–5.1)
Sodium: 138 mmol/L (ref 135–145)

## 2022-11-24 LAB — DIGOXIN LEVEL: Digoxin Level: 0.6 ng/mL — ABNORMAL LOW (ref 0.8–2.0)

## 2022-11-24 MED ORDER — POTASSIUM CHLORIDE CRYS ER 20 MEQ PO TBCR
20.0000 meq | EXTENDED_RELEASE_TABLET | Freq: Every day | ORAL | Status: DC
  Administered 2022-11-24: 20 meq via ORAL
  Filled 2022-11-24: qty 1

## 2022-11-24 MED ORDER — APIXABAN 5 MG PO TABS: 5.0000 mg | ORAL_TABLET | Freq: Two times a day (BID) | ORAL | 1 refills | Status: DC

## 2022-11-24 NOTE — Progress Notes (Signed)
Occupational Therapy Treatment Patient Details Name: Brittany [name] MRN: [id number] DOB: September 26, 1961 Today's Date: 11/24/2022   History of present illness 61 y.o. female presented 11/14/22 with SOB. She was diagnosed with acute influenza pneumonia along with CHF exacerbation causing acute hypoxic respiratory failure. 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 Seen for skilled OT treatment session.  Pt. Declines oob and in room therapeutic tasks. States she "has been through this before and doesn't think she needs to keep practicing everything".  Respectful of pts. Wishes reviewed we did not have to continue our services.  She was receptive to this.  Agreeable to energy conservation education.  Handouts provided and reviewed some examples with pt. Also.  Pt. Verbalized understanding and also states she  has a friend that lives with her that can help if/as needed.  Pt. With no other needs or concerns.  Will alert otr/l to sign off per pt. Request.     Recommendations for follow up therapy are one component of a multi-disciplinary discharge planning process, led by the attending physician.  Recommendations may be updated based on patient status, additional functional criteria and insurance authorization.    Follow Up Recommendations        Assistance Recommended at Discharge Frequent or constant Supervision/Assistance  Patient can return home with the following  A little help with walking and/or transfers;A little help with bathing/dressing/bathroom;Assistance with cooking/housework;Assist for transportation;Help with stairs or ramp for entrance   Equipment Recommendations  Other (comment);BSC/3in1    Recommendations for Other Services      Precautions / Restrictions Precautions Precautions:  Fall Precaution Comments: watch SpO2/BP       Mobility Bed Mobility                    Transfers                         Balance                                           ADL either performed or assessed with clinical judgement   ADL Overall ADL's : Needs assistance/impaired                                       General ADL Comments: pt. declined all oob tx attempts.  reports she feels confident that she can complete all tasks that she wants to and because she moved well yesterday does not feel the need to "practice everything again".  agreeable to some energy conservation education, with examples and handouts provided.  will alert otr/l to sign off per pt. request. eager to get home asap and reports she has a friend that lives with her that can help and has been helping if/as needed    Extremity/Trunk Assessment              Vision       Perception     Praxis      Cognition Arousal/Alertness: Awake/alert Behavior During Therapy: Flat affect Overall Cognitive Status: Within Functional Limits for tasks assessed  Exercises      Shoulder Instructions       General Comments      Pertinent Vitals/ Pain       Pain Assessment Pain Assessment: No/denies pain  Home Living                                          Prior Functioning/Environment              Frequency  Min 2X/week        Progress Toward Goals  OT Goals(current goals can now be found in the care plan section)  Progress towards OT goals: Progressing toward goals     Plan Discharge plan remains appropriate    Co-evaluation                 AM-PAC OT "6 Clicks" Daily Activity     Outcome Measure   Help from another person eating meals?: None Help from another person taking care of personal grooming?: A Little Help from another person toileting,  which includes using toliet, bedpan, or urinal?: A Lot Help from another person bathing (including washing, rinsing, drying)?: A Lot Help from another person to put on and taking off regular upper body clothing?: A Lot Help from another person to put on and taking off regular lower body clothing?: A Lot 6 Click Score: 15    End of Session Equipment Utilized During Treatment: Other (comment) (energy conservation handouts provided)  OT Visit Diagnosis: Muscle weakness (generalized) (M62.81);Unsteadiness on feet (R26.81);Other abnormalities of gait and mobility (R26.89)   Activity Tolerance Other (comment) (limited to what pt. would agree to do during session declined all mobility and oob)   Patient Left in bed;with call bell/phone within reach   Nurse Communication          Time: 0816-0825 OT Time Calculation (min): 9 min  Charges: OT General Charges $OT Visit: 1 Visit OT Treatments $Self Care/Home Management : 8-22 mins  Sonia Baller, COTA/L Acute Rehabilitation [phone]   Clearnce Sorrel Lorraine-COTA/L 11/24/2022, 11:54 AM

## 2022-11-24 NOTE — Discharge Summary (Addendum)
Physician Discharge Summary  Brittany [name] IDP:[id number] DOB: 07-16-1961  PCP: Traci Sermon, PA-C  Admitted from: Home Discharged to: Home.  Declines CIR.   Admit date: 11/14/2022 Discharge date: 11/24/2022  Recommendations for Outpatient Follow-up:    Follow-up Information     [facility] Follow up on 12/12/2022.   Specialty: Cardiology Why: Advanced [facility] Failure [facility] at 3 pm Entrance C, free valet parking Please bring all medications with you Contact information: [address] [phone] [place]        Traci Sermon, [place]. Go on 12/07/2022.   Specialty: Physician Assistant Why: _0 :30 with Dr.Sun.  To be seen with repeat labs (CBC & BMP). Contact information: [place] [address] [phone]                  Home Health: PT & RN    Equipment/Devices:     Durable Medical Equipment  (From admission, onward)           Start     Ordered   11/24/22 1250  For home use only DME 3 n 1  Once        11/24/22 1249             Discharge Condition: Improved and stable.   Code Status: DNR Diet recommendation:  Discharge Diet Orders (From admission, onward)     Start     Ordered   11/24/22 0000  Diet - low sodium heart healthy        11/24/22 1235   11/24/22 0000  Diet Carb Modified        11/24/22 1235             Discharge Diagnoses:  Active Problems:   Sepsis ([place])   Influenza A with pneumonia   Acute on chronic respiratory failure with hypoxia (HCC)   COPD exacerbation (HCC)   Elevated troponin   Hypotension   Chronic HFrEF (heart failure with reduced ejection fraction) (HCC)   AF (paroxysmal atrial fibrillation) (HCC)   History of lung cancer   Brief Summary: 61 y.o. female with medical history significant of PAF, hypertension, previous small cell lung cancer treated with chemo/ chest XRT/  prophylactic brain radiation in 2015, CAD s/p CABG, chronic systolic HF EF ~99%, CKD stage IIIa, prior history of tobacco abuse who presents with complaints of progressively worsening shortness of breath over the 3 to 4 days PTA.  She was diagnosed with acute influenza pneumonia along with CHF exacerbation causing acute hypoxic respiratory failure, she was placed on BiPAP and admitted to the hospital.       Assessment & Plan:   Sepsis (POA) secondary to influenza A with pneumonia, possibility of superimposed bacterial pneumonia:   Patient's sepsis pathophysiology has resolved she has finished treatment with Tamiflu and antibiotics for influenza A infection and bronchitis respectively.  Initially on as needed BiPAP, then weaned to [place] oxygen and has been weaned off to room air for the last 24 hours.  PTA, patient on 2 L/min [place] oxygen as needed.  As per prior [facility] provider, anxiety contributing to her dyspnea/hypoxia and Ativan was added but this will not be continued at time of discharge.  Again as per prior [facility] provider documentation, note she has persistently expressed her wishes to be DNR and does not want to be intubated if she gets worse.  Final blood cultures x 2: Negative.  Have requested home  O2 eval PTA.   Acute on chronic respiratory failure with hypoxia COPD with exacerbation  -at baseline at home, on 2 L/min [place] oxygen as needed.  Required BiPAP initially on admission.  Now weaned down to room air.  Acute respiratory failure secondary to influenza A + bacterial pneumonia and decompensated CHF.  Chest x-ray 12/12: No frank pulmonary edema.  Small bilateral pleural effusions.  Hypoxia has resolved.  Dyspnea significantly improved and even resolved at this time.   Acute on chronic systolic heart failure EF 20% with AICD.   Advanced heart failure cardiology consultation and follow-up appreciated.  Diuresed with IV Lasix per cardiology team.  Cardiology gave an additional dose of IV Lasix 40 mg 12/13.   Milrinone drip being discontinued 12/13.  Continue digoxin 0.125 Mg daily, digoxin level okay on 12/9 and 0.6 on 12/15.  Continue Farxiga, spironolactone, Corlanor.  Failed Entresto due to hyperkalemia.  No beta-blockers due to intolerance with low output and COPD with frequent flares.  Per cardiology, not a candidate for advanced therapies with her degree of lung disease.  S/p Barostim.  -1950 yesterday.  Clinically appears euvolemic.  BNP one 271.8.  Unclear why trending daily BMP, CRP and procalcitonin-discontinued.  Patient vehemently declines CIR and insist that she wants to go home where she lives alone but states that her sister and a friend live close by, can come by and stay with her for a couple of days and can assist her as needed.  She also states that the EMS is "2 minutes away". Patient was switched from IV Lasix to prior home dose of torsemide and Aldactone and monitored overnight.  She has clinically done well and stable.  Cardiology has cleared her for discharge and have arranged outpatient follow-up.   Elevated troponin, CAD -  High-sensitivity troponin 152-> 294.  This is demand ischemia from hypoxia caused by problem stated above, troponin trend is flat and in non-ACS pattern, she is chest pain-free.  Cardiology team on board. Continue statin for secondary prevention.   PAF on chronic anticoagulation - CHA2DS2-VASc score equal to at least 4.   On Eliquis (increased from 2.5-5 Mg twice daily) and digoxin. Continue.  Sinus rhythm on telemetry.   CKD stage IIIa - Stable.  Creatinine 1.08 yesterday, up to 1.16 today.   History of squamous cell lung cancer - Patient with prior treatment chemotherapy, chest radiation, and prophylactic brain radiation in 2015.   Hypokalemia.  Replaced.   Mild Nausea & some dizziness - stable exam, stable KUB, digoxin level stable, ? BPPV added meclizine + Benzo, all antibiotics stopped question if it was side effects of antibiotics, also blood pressure  slightly soft hence TED stockings added, continue PT OT.  Vestibular PT input appreciated.  Orthostatics negative.  Dizziness resolved.  Ambulated 300 feet with mobility specialist last evening.   Body mass index is 21.6 kg/m.      Consultants:   Cardiology PCCM   Procedures:       Discharge Instructions  Discharge Instructions     (HEART FAILURE PATIENTS) Call MD:  Anytime you have any of the following symptoms: 1) 3 pound weight gain in 24 hours or 5 pounds in 1 week 2) shortness of breath, with or without a dry hacking cough 3) swelling in the hands, feet or stomach 4) if you have to sleep on extra pillows at night in order to breathe.   Complete by: As directed    Call MD for:  difficulty breathing, headache  or visual disturbances   Complete by: As directed    Call MD for:  extreme fatigue   Complete by: As directed    Call MD for:  persistant dizziness or light-headedness   Complete by: As directed    Diet - low sodium heart healthy   Complete by: As directed    Diet Carb Modified   Complete by: As directed    Increase activity slowly   Complete by: As directed         Medication List     STOP taking these medications    metolazone 2.5 MG tablet Commonly known as: ZAROXOLYN   predniSONE 10 MG tablet Commonly known as: DELTASONE       TAKE these medications    albuterol 108 (90 Base) MCG/ACT inhaler Commonly known as: VENTOLIN HFA Inhale 2 puffs into the lungs every 6 (six) hours as needed for wheezing or shortness of breath.   apixaban 5 MG Tabs tablet Commonly known as: ELIQUIS Take 1 tablet (5 mg total) by mouth 2 (two) times daily. What changed:  medication strength how much to take   atorvastatin 80 MG tablet Commonly known as: LIPITOR Take 1 tablet (80 mg total) by mouth daily at 6 PM.   blood glucose meter kit and supplies Kit Dispense based on patient and insurance preference. Use up to four times daily as directed.   dapagliflozin  propanediol 10 MG Tabs tablet Commonly known as: Farxiga Take 1 tablet by mouth daily.   digoxin 0.125 MG tablet Commonly known as: LANOXIN Take 1 tablet (0.125 mg total) by mouth daily.   Glucerna Liqd Take 237 mLs by mouth See admin instructions. Takes 1 bottle twice a week   ipratropium-albuterol 0.5-2.5 (3) MG/3ML Soln Commonly known as: DUONEB Take 3 mLs by nebulization every 4 (four) hours as needed (for shortness of breath).   ivabradine 7.5 MG Tabs tablet Commonly known as: Corlanor Take 1 tablet (7.5 mg total) by mouth 2 (two) times daily with a meal. TAKE 1 TABLET (7.5 MG TOTAL) BY MOUTH 2 (TWO) TIMES DAILY WITH A MEAL.   multivitamin with minerals Tabs tablet Take 1 tablet by mouth daily.   nitroGLYCERIN 0.4 MG SL tablet Commonly known as: NITROSTAT Place 1 tablet (0.4 mg total) under the tongue every 5 (five) minutes as needed for chest pain.   potassium chloride SA 20 MEQ tablet Commonly known as: KLOR-CON M Take 1 tablet (20 mEq total) by mouth daily. TAke extra 33mq when you take higher dose of torsemide or metolazone   spironolactone 25 MG tablet Commonly known as: ALDACTONE Take 1 tablet (25 mg total) by mouth daily.   torsemide 20 MG tablet Commonly known as: DEMADEX Take 3 tablets (60 mg total) by mouth daily.   Visine 0.025-0.3 % ophthalmic solution Generic drug: naphazoline-pheniramine Place 1 drop into both eyes 4 (four) times daily as needed for eye irritation.       Allergies  Allergen Reactions   Codeine Nausea And Vomiting      Procedures/Studies: DG Chest Port 1 View  Result Date: 11/21/2022 CLINICAL DATA:  61year old female with history of shortness of breath. EXAM: PORTABLE CHEST 1 VIEW COMPARISON:  Chest x-ray 11/20/2022. FINDINGS: Lung volumes are normal. No consolidative airspace disease. Small bilateral pleural effusions (right greater than left). No pneumothorax. Cephalization of the pulmonary vasculature, without frank  pulmonary edema. Mild cardiomegaly. Upper mediastinal contours are within normal limits. Atherosclerotic calcifications in the thoracic aorta. Status post median sternotomy  for CABG. Left-sided pacemaker/AICD with lead tip projecting over the expected location of the right ventricle apex. Electronic device in the lateral right hemithorax with lead extending cephalad, potentially a deep brain stimulator. IMPRESSION: 1. Cardiomegaly with pulmonary venous congestion, but no frank pulmonary edema. 2. Small bilateral pleural effusions. 3. Aortic atherosclerosis. 4. Postoperative changes and support apparatus, as above. Electronically Signed   By: Vinnie Langton M.D.   On: 11/21/2022 06:44   DG Chest Port 1 View  Result Date: 11/20/2022 CLINICAL DATA:  Dyspnea EXAM: PORTABLE CHEST 1 VIEW COMPARISON:  Chest radiograph from one day prior. FINDINGS: Stable spinal stimulator with lead coursing superiorly into the right neck. Intact sternotomy wires. Stable configuration of single lead left subclavian ICD. Stable cardiomediastinal silhouette with top-normal heart size. No pneumothorax. No pleural effusion. Stable streaky mild bibasilar lung opacities. No pulmonary edema. IMPRESSION: Stable streaky mild bibasilar lung opacities, favor scarring or atelectasis. Otherwise no active cardiopulmonary disease. Electronically Signed   By: Ilona Sorrel M.D.   On: 11/20/2022 07:34   DG Chest Port 1 View  Result Date: 11/19/2022 CLINICAL DATA:  Shortness of breath EXAM: PORTABLE CHEST 1 VIEW COMPARISON:  Two days ago FINDINGS: Cardiomegaly. Right perihilar indistinctness with scar-like appearance by CT in the setting of prior radiotherapy. Vagal nerve stimulator on the right and single chamber defibrillator lead into the right ventricle on the left. Prior CABG. There is no edema, consolidation, effusion, or pneumothorax. IMPRESSION: Stable post treatment chest.  No evidence of acute disease. Electronically Signed   By:  Jorje Guild M.D.   On: 11/19/2022 06:20   DG Abd Portable 1V  Result Date: 11/18/2022 CLINICAL DATA:  Nausea vomiting EXAM: PORTABLE ABDOMEN - 1 VIEW COMPARISON:  None Available. FINDINGS: The bowel gas pattern is normal. No radio-opaque calculi or other significant radiographic abnormality are seen. IMPRESSION: Negative. Electronically Signed   By: Sammie Bench M.D.   On: 11/18/2022 08:36   DG Chest Port 1 View  Result Date: 11/17/2022 CLINICAL DATA:  Shortness of breath. EXAM: PORTABLE CHEST 1 VIEW COMPARISON:  11/16/2022 FINDINGS: 0644 hours. The cardio pericardial silhouette is enlarged. Left-sided AICD again noted. Battery pack for right cervical stimulator device evident. There is pulmonary vascular congestion without overt pulmonary edema. Underlying diffuse interstitial opacity is similar to prior. Similar appearance subtle airspace disease in both lung bases. Pleural effusion. Telemetry leads overlie the chest. IMPRESSION: No substantial interval change in exam. Electronically Signed   By: Misty Stanley M.D.   On: 11/17/2022 07:07   DG Chest Port 1 View  Result Date: 11/16/2022 CLINICAL DATA:  Shortness of breath. EXAM: PORTABLE CHEST 1 VIEW COMPARISON:  November 15, 2022. FINDINGS: Stable cardiomegaly. Left-sided defibrillator is unchanged. Status post coronary bypass graft. Left lung is clear. Improved right basilar and midlung opacities are noted. The visualized skeletal structures are unremarkable. IMPRESSION: Improved right lung opacities. Electronically Signed   By: Marijo Conception M.D.   On: 11/16/2022 08:29   DG CHEST PORT 1 VIEW  Result Date: 11/15/2022 CLINICAL DATA:  Dyspnea, respiratory distress EXAM: PORTABLE CHEST 1 VIEW COMPARISON:  11/14/2022 FINDINGS: The lungs are symmetrically well expanded. Patchy infiltrate is again seen within the right mid lung zone, possibly infectious or inflammatory in nature. Small right pleural effusion is again noted. No pneumothorax. No  pleural effusion on the left. Coronary artery bypass grafting has been performed. Mild cardiomegaly is stable. Left subclavian pacemaker defibrillator is unchanged. Neurostimulator battery pack overlies the right hemithorax, unchanged. Pulmonary  vascularity is normal. No acute bone abnormality. IMPRESSION: 1. Stable patchy infiltrate within the right mid lung zone, possibly infectious or inflammatory in nature. 2. Stable small right pleural effusion. Electronically Signed   By: Fidela Salisbury M.D.   On: 11/15/2022 01:30   DG Chest Port 1 View  Result Date: 11/14/2022 CLINICAL DATA:  Shortness of breath. EXAM: PORTABLE CHEST 1 VIEW COMPARISON:  07/09/2022 FINDINGS: 0739 hours. The cardio pericardial silhouette is enlarged. Interstitial markings are diffusely coarsened with chronic features. Interval increase in right base atelectasis or infiltrate with stable scarring or tiny effusion at the right base. Left AICD again noted. Battery pack for right cervical stimulator device evident. Telemetry leads overlie the chest. IMPRESSION: Interval increase in right base atelectasis or infiltrate with stable scarring or tiny effusion at the right base. Electronically Signed   By: Misty Stanley M.D.   On: 11/14/2022 07:57      Subjective: Denies complaints.  Dyspnea resolved.  No dizziness.  Discharge Exam:  Vitals:   11/24/22 0501 11/24/22 0836 11/24/22 0928 11/24/22 1129  BP: (!) 96/58  95/62 99/63  Pulse:  95  73  Resp: _0 Temp: 98.8 F (37.1 C) 98 F (36.7 C)  99.4 F (37.4 C)  TempSrc: Oral Oral  Oral  SpO2: 93% 91%  94%  Weight: 46.7 kg     Height:        General exam: Middle-age female, looks older than stated age, lying comfortably supine in bed without distress.  Appears to be in good spirits. Respiratory system: Clear to auscultation.  No increased work of breathing. Cardiovascular system: S1 & S2 heard, RRR. No JVD, murmurs, rubs, gallops or clicks. No pedal edema.  Telemetry  personally reviewed: Sinus rhythm. Gastrointestinal system: Abdomen is nondistended, soft and nontender. No organomegaly or masses felt. Normal bowel sounds heard. Central nervous system: Alert and oriented. No focal neurological deficits. Extremities: Symmetric 5 x 5 power. Skin: No rashes, lesions or ulcers Psychiatry: Judgement and insight appear normal. Mood & affect pleasant and appropriate.    The results of significant diagnostics from this hospitalization (including imaging, microbiology, ancillary and laboratory) are listed below for reference.     Microbiology: Recent Results (from the past 240 hour(s))  MRSA Next Gen by PCR, Nasal     Status: None   Collection Time: 11/15/22  5:43 AM   Specimen: Nasal Mucosa; Nasal Swab  Result Value Ref Range Status   MRSA by PCR Next Gen NOT DETECTED NOT DETECTED Final    Comment: (NOTE) The GeneXpert MRSA Assay (FDA approved for NASAL specimens only), is one component of a comprehensive MRSA colonization surveillance program. It is not intended to diagnose MRSA infection nor to guide or monitor treatment for MRSA infections. Test performance is not FDA approved in patients less than 2 years old. Performed at [facility], [place] [address], [place], [place] [address]      Labs: CBC: Recent Labs  Lab 11/18/22 0031 11/19/22 0049 11/20/22 0121 11/22/22 0053 11/23/22 0046  WBC 7.5 6.8 7.8 9.8 9.8  NEUTROABS 6.5 5.8 7.1 7.8* 7.5  HGB 13.0 13.3 14.0 12.4 12.5  HCT 39.9 40.7 44.3 38.9 38.6  MCV 91.5 90.6 92.3 90.9 89.6  PLT 177 173 164 209 169    Basic Metabolic Panel: Recent Labs  Lab 11/18/22 0031 11/19/22 0049 11/20/22 0121 11/21/22 0048 11/22/22 0053 11/23/22 0046 11/24/22 0049  NA 137 139 136 137 140 140 138  K  3.7 4.8 4.4 3.7 3.8 4.0 3.7  CL 101 102 102 102 107 107 101  CO2 _0 GLUCOSE 180* 191* 101* 179* 127* 163* 137*  BUN 33* 25* _1 CREATININE 1.38* 1.08* 0.97 0.93 0.88  1.08* 1.16*  CALCIUM 8.8* 9.1 8.8* 9.1 9.3 9.1 9.1  MG 1.9 1.9  --   --  2.0 2.2 2.0      Time coordinating discharge: 35 minutes  SIGNED:  Vernell Leep, MD,  FACP, FHM, SFHM, [facility], [facility]   [facility] Phys[facility] Advisor [place]     To contact the attending provider between 7A-7P or the covering provider during after hours 7P-7A, please log into the web site [email or web address] and access using universal [place] password for that web site. If you do not have the password, please call the hospital operator.

## 2022-11-24 NOTE — TOC Initial Note (Signed)
Transition of Care [facility]) - Initial/Assessment Note    Patient Details  Name: Brittany [name] MRN: [id number] Date of Birth: May 07, 1961  Transition of Care [facility]) CM/SW Contact:    Erenest Rasher, RN Phone Number: [phone] 11/24/2022, 2:19 PM  Clinical Narrative:                  HF TOC CM spoke pt at bedside. She declines 3n1 bedside commode. She has oxygen at home. Portable is not needed, as pt is sats 90% while ambulating. States her boyfriend will provide transportation home. Contacted [facility] with new referral. Waiting confirmation.     Expected Discharge Plan: [place] Barriers to Discharge: No Barriers Identified   Patient Goals and CMS Choice Patient states their goals for this hospitalization and ongoing recovery are:: wants to feel better      Expected Discharge Plan and Services Expected Discharge Plan: [place]   Discharge Planning Services: CM Consult Post Acute Care Choice: [place] arrangements for the past 2 months: Apartment Expected Discharge Date: 11/24/22                         HH Arranged: RN, PT HH Agency: [place] Date [facility] Agency Contacted: 11/24/22 Time HH Agency Contacted: 24 Representative spoke with at [place]: Adela Lank  Prior Living Arrangements/Services Living arrangements for the past 2 months: Apartment Lives with:: Self Patient language and need for interpreter reviewed:: Yes Do you feel safe going back to the place where you live?: Yes      Need for Family Participation in Patient Care: No (Comment) Care giver support system in place?: Yes (comment) Current home services: DME (Rollator, oxygen ([employer]), scale) Criminal Activity/Legal Involvement Pertinent to Current Situation/Hospitalization: No - Comment as needed  Activities of Daily Living Home Assistive Devices/Equipment: BIPAP, Oxygen ADL Screening (condition at time of  admission) Patient's cognitive ability adequate to safely complete daily activities?: Yes Is the patient deaf or have difficulty hearing?: No Does the patient have difficulty seeing, even when wearing glasses/contacts?: No Does the patient have difficulty concentrating, remembering, or making decisions?: No Patient able to express need for assistance with ADLs?: Yes Does the patient have difficulty dressing or bathing?: No Independently performs ADLs?: Yes (appropriate for developmental age) Does the patient have difficulty walking or climbing stairs?: No Weakness of Legs: None Weakness of Arms/Hands: None  Permission Sought/Granted Permission sought to share information with : Case Manager, Family Supports, PCP Permission granted to share information with : Yes, Verbal Permission Granted  Share Information with NAME: Anice Paganini     Permission granted to share info w Relationship: SO  Permission granted to share info w Contact Information: [phone]  Emotional Assessment Appearance:: Appears stated age Attitude/Demeanor/Rapport: Engaged Affect (typically observed): Accepting Orientation: : Oriented to Self, Oriented to Place, Oriented to  Time, Oriented to Situation   Psych Involvement: No (comment)  Admission diagnosis:  Acute respiratory failure with hypoxia (HCC) [J96.01] Dyspnea, unspecified type [R06.00] Patient Active Problem List   Diagnosis Date Noted   Acute respiratory failure with hypoxia ([place]) 11/14/2022   Sepsis ([place]) 11/14/2022   Influenza A with pneumonia 11/14/2022   Elevated troponin 11/14/2022   History of lung cancer 11/14/2022   Acute combined systolic and diastolic CHF, NYHA class 4 ([place]) 07/09/2022   Malnutrition of moderate degree 06/20/2022   Lactic acidosis 04/12/2022   Prolonged QT  interval 04/09/2022   Hypotension 04/09/2022   Chronic HFrEF (heart failure with reduced ejection fraction) ([place]) 12/11/2021   CHF exacerbation ([place]) 12/10/2021    Renal infarct ([place]) 12/10/2021   COPD exacerbation ([place]) 09/29/2021   Mixed diabetic hyperlipidemia associated with type 2 diabetes mellitus ([place]) 09/29/2021   CKD (chronic kidney disease) stage 3, GFR 30-59 ml/min (HCC) 09/29/2021   Unstable angina ([place]) 02/09/2021   Hemoptysis 10/20/2020   Type 2 diabetes mellitus with stage 3a chronic kidney disease, without long-term current use of insulin ([place]) 10/20/2020   Acute on chronic combined systolic and diastolic CHF (congestive heart failure) ([place]) 05/21/2020   Ischemic cardiomyopathy 04/12/2020   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   NSTEMI (non-ST elevated myocardial infarction) ([place]) 02/12/2019   CHF (congestive heart failure) ([place]) 11/24/2018   Recurrent pleural effusion on right 05/05/2018   Acute on chronic respiratory failure with hypoxia ([place]) 05/05/2018   Chronic respiratory failure with hypoxia (HCC)    Asthma    Anxiety    Allergy    HCAP (healthcare-associated pneumonia) 03/15/2015   Chest pain 03/15/2015   Small cell lung cancer ([place]) 03/16/2014   Protein-calorie malnutrition, severe ([place]) 03/14/2014   AF (paroxysmal atrial fibrillation) ([place]) 03/12/2014   Essential hypertension 05/07/2012   PCP:  Traci Sermon, PA-C Pharmacy:   [id number] - [place], [address] [address] Phone: [phone] Fax: [phone]     Social Determinants of Health (SDOH) Interventions    Readmission Risk Interventions    06/20/2022   10:00 AM 04/11/2021   10:16 AM  Readmission Risk Prevention Plan  Transportation Screening Complete Complete  PCP or Specialist Appt within 3-5 Days Complete Complete  HRI or [place] Complete Complete  Social Work Consult for [place] Planning/Counseling Complete Complete  Palliative Care Screening Not Applicable Not Applicable  Medication Review Press photographer) Complete Complete

## 2022-11-24 NOTE — Progress Notes (Signed)
SATURATION QUALIFICATIONS: (This note is used to comply with regulatory documentation for home oxygen)  Patient Saturations on Room Air at Rest = 90%  Patient Saturations on Room Air while Ambulating = 93%    Please briefly explain why patient needs home oxygen: Pt demonstrated DOE, but was able to sustain sats >/= 90% at rest and with mobility on RA at this time.   Moishe Spice, PT, DPT Acute Rehabilitation Services  Office: [phone]

## 2022-11-24 NOTE — Discharge Instructions (Signed)

## 2022-11-24 NOTE — Progress Notes (Signed)
Physical Therapy Treatment Patient Details Name: Brittany [name] MRN: [id number] DOB: [birth year] Today's Date: 11/24/2022   History of Present Illness 61 y.o. female presented 11/14/22 with SOB. She was diagnosed with acute influenza pneumonia along with CHF exacerbation causing acute hypoxic respiratory failure. PMH: PAF, hypertension, previous small cell lung cancer treated with chemo/ chest XRT/ prophylactic brain radiation in 2015, CAD s/p CABG, chronic systolic HF EF ~74%, CKD stage IIIa, prior history of tobacco abuse    PT Comments    Pt has made great progress with mobility again, now performing all functional mobility at a min guard-supervision level using a rollator. She remains at risk for falls, evident by her slow gait speed, but was able to perform all functional mobility this session without LOB. Pt also reported no longer having any dizziness today or throughout this session, even when cued to change head positions and perform a modified R [employer]. Updated d/c recs to HHPT due to her significant improvement. Will continue to follow acutely.     Recommendations for follow up therapy are one component of a multi-disciplinary discharge planning process, led by the attending physician.  Recommendations may be updated based on patient status, additional functional criteria and insurance authorization.  Follow Up Recommendations  Home health PT     Assistance Recommended at Discharge Intermittent Supervision/Assistance  Patient can return home with the following A little help with bathing/dressing/bathroom;Assistance with cooking/housework;Assist for transportation;Help with stairs or ramp for entrance;A little help with walking and/or transfers   Equipment Recommendations  BSC/3in1    Recommendations for Other Services       Precautions / Restrictions Precautions Precautions: Fall Precaution Comments: watch SpO2/BP Restrictions Weight Bearing Restrictions: No      Mobility  Bed Mobility Overal bed mobility: Modified Independent Bed Mobility: Sit to Supine       Sit to supine: Modified independent (Device/Increase time)   General bed mobility comments: Extra time to lay down from sitting R EOB but no assistance needed    Transfers Overall transfer level: Needs assistance Equipment used: Rollator (4 wheels) Transfers: Sit to/from Stand Sit to Stand: Supervision, Min guard           General transfer comment: Supervision to come to stand from commode but min guard for transfers to/from rollator due to pt not locking brakes prior to transfers. Educated pt on importance of locking brakes.    Ambulation/Gait Ambulation/Gait assistance: Min guard Gait Distance (Feet): 75 Feet (x2 bouts of ~6 ft > ~75 ft) Assistive device: Rollator (4 wheels) Gait Pattern/deviations: Step-through pattern, Decreased stride length, Trunk flexed Gait velocity: reduced Gait velocity interpretation: <1.8 ft/sec, indicate of risk for recurrent falls   General Gait Details: Pt with slow, but fairly steady gait without LOB, maintaining a flexed posture. Min guard for Barrister's clerk    Modified Rankin (Stroke Patients Only)       Balance Overall balance assessment: Needs assistance Sitting-balance support: No upper extremity supported, Feet supported Sitting balance-Leahy Scale: Good     Standing balance support: Single extremity supported, Bilateral upper extremity supported, During functional activity, Reliant on assistive device for balance Standing balance-Leahy Scale: Poor Standing balance comment: Reliant on UE support                            Cognition Arousal/Alertness: Awake/alert Behavior During Therapy: WFL for tasks  assessed/performed Overall Cognitive Status: Within Functional Limits for tasks assessed                                 General Comments: pt smiling  and joking today        Exercises      General Comments General comments (skin integrity, edema, etc.): not formal, but partial [place] done with pt returning to supine with delayed R neck rotation AROM but pt denies any further dizziness today and throughout session with all cues at turning and extending neck; encouraged activity pacing; SpO2 >/= 90% on RA throughout      Pertinent Vitals/Pain Pain Assessment Pain Assessment: Faces Faces Pain Scale: No hurt Pain Intervention(s): Monitored during session    Home Living                          Prior Function            PT Goals (current goals can now be found in the care plan section) Acute Rehab PT Goals Patient Stated Goal: to go home PT Goal Formulation: With patient Time For Goal Achievement: 11/29/22 Potential to Achieve Goals: Good Progress towards PT goals: Progressing toward goals    Frequency    Min 3X/week      PT Plan Discharge plan needs to be updated;Equipment recommendations need to be updated    Co-evaluation              AM-PAC PT "6 Clicks" Mobility   Outcome Measure  Help needed turning from your back to your side while in a flat bed without using bedrails?: A Little Help needed moving from lying on your back to sitting on the side of a flat bed without using bedrails?: A Little Help needed moving to and from a bed to a chair (including a wheelchair)?: A Little Help needed standing up from a chair using your arms (e.g., wheelchair or bedside chair)?: A Little Help needed to walk in hospital room?: A Little Help needed climbing 3-5 steps with a railing? : A Little 6 Click Score: 18    End of Session   Activity Tolerance: Patient tolerated treatment well Patient left: in bed;with call bell/phone within reach;with bed alarm set   PT Visit Diagnosis: Muscle weakness (generalized) (M62.81);Other abnormalities of gait and mobility (R26.89);Unsteadiness on feet  (R26.81);Difficulty in walking, not elsewhere classified (R26.2)     Time: 0981-1914 PT Time Calculation (min) (ACUTE ONLY): 14 min  Charges:  $Therapeutic Activity: 8-22 mins                     Moishe Spice, PT, DPT Acute Rehabilitation Services  Office: [phone]    Orvan Falconer 11/24/2022, 1:39 PM

## 2022-11-24 NOTE — Progress Notes (Addendum)
Advanced Heart Failure Rounding Note  PCP-Cardiologist: Dr. [name]   Subjective:    12/10: Empiric milrinone started 12/13: Milrinone discontinued   Feeling better each day. Walked halls yesterday. Dyspnea improved. No orthopnea or PND. Now on RA.  Objective:   Weight Range: 46.7 kg Body mass index is 20.79 kg/m.   Vital Signs:   Temp:  [98.1 F (36.7 C)-99.3 F (37.4 C)] 98.8 F (37.1 C) (12/15 0501) Pulse Rate:  [69-84] 84 (12/14 1530) Resp:  [17-20] 17 (12/15 0501) BP: (96-129)/(48-60) 96/58 (12/15 0501) SpO2:  [93 %-100 %] 93 % (12/15 0501) Weight:  [46.7 kg] 46.7 kg (12/15 0501) Last BM Date : 11/19/22  Weight change: Filed Weights   11/22/22 0523 11/23/22 0545 11/24/22 0501  Weight: 47.1 kg 48.5 kg 46.7 kg    Intake/Output:   Intake/Output Summary (Last 24 hours) at 11/24/2022 0731 Last data filed at 11/24/2022 0500 Gross per 24 hour  Intake 1197 ml  Output 1950 ml  Net -753 ml      Physical Exam    General: Lying flat in bed HEENT: normal Neck: supple. no JVD. Carotids 2+ bilat; no bruits.  Cor: PMI nondisplaced. Regular rate & rhythm. No rubs, gallops or murmurs. Lungs: still with some rhonchi but improved air movement Abdomen: soft, nontender, nondistended.  Extremities: no cyanosis, clubbing, rash, edema Neuro: alert & orientedx3, cranial nerves grossly intact. moves all 4 extremities w/o difficulty. Affect pleasant    Telemetry   SR 70s-80s  Labs    CBC Recent Labs    11/22/22 0053 11/23/22 0046  WBC 9.8 9.8  NEUTROABS 7.8* 7.5  HGB 12.4 12.5  HCT 38.9 38.6  MCV 90.9 89.6  PLT 209 433   Basic Metabolic Panel Recent Labs    11/23/22 0046 11/24/22 0049  NA 140 138  K 4.0 3.7  CL 107 101  CO2 25 27  GLUCOSE 163* 137*  BUN 16 18  CREATININE 1.08* 1.16*  CALCIUM 9.1 9.1  MG 2.2 2.0   Liver Function Tests No results for input(s): "AST", "ALT", "ALKPHOS", "BILITOT", "PROT", "ALBUMIN" in the last 72 hours.  No  results for input(s): "LIPASE", "AMYLASE" in the last 72 hours. Cardiac Enzymes No results for input(s): "CKTOTAL", "CKMB", "CKMBINDEX", "TROPONINI" in the last 72 hours.  BNP: BNP (last 3 results) Recent Labs    11/20/22 0121 11/22/22 0053 11/23/22 0046  BNP 1,081.0* 1,130.0* 1,271.8*    ProBNP (last 3 results) No results for input(s): "PROBNP" in the last 8760 hours.   D-Dimer No results for input(s): "DDIMER" in the last 72 hours. Hemoglobin A1C No results for input(s): "HGBA1C" in the last 72 hours. Fasting Lipid Panel No results for input(s): "CHOL", "HDL", "LDLCALC", "TRIG", "CHOLHDL", "LDLDIRECT" in the last 72 hours. Thyroid Function Tests No results for input(s): "TSH", "T4TOTAL", "T3FREE", "THYROIDAB" in the last 72 hours.  Invalid input(s): "FREET3"  Other results:   Imaging    No results found.   Medications:     Scheduled Medications:  apixaban  5 mg Oral BID   atorvastatin  80 mg Oral Daily   dapagliflozin propanediol  10 mg Oral Daily   digoxin  0.125 mg Oral Daily   guaiFENesin  600 mg Oral BID   ivabradine  7.5 mg Oral BID WC   LORazepam  1 mg Oral Q0600   sodium chloride flush  3 mL Intravenous Q12H   spironolactone  25 mg Oral Daily   torsemide  60 mg Oral Daily  Infusions:    PRN Medications: acetaminophen **OR** acetaminophen, albuterol, loperamide, naphazoline-pheniramine, nitroGLYCERIN, ondansetron (ZOFRAN) IV    Patient Profile   Brittany [name] is a 61 y.o.Mar[place] Kitchen female with a history of PAF,  previous smoker quit 2015  years ago, hypertension, previous small cell lung cancer treated with chemo, chest XRT and prophylactic brain radiation in 2015, CAD s/p CABG and chronic systolic HF EF ~15%. Presented to the ED with worsening SOB, multifactorial from COPDE, CHF and found to be influenza +. AHF team asked to see for acute on chronic systolic heart failure.   Assessment/Plan   Acute hypoxic respiratory failure due to  COPD w/ exacerbation, +influenza - tested + for influenza, WBC 13.3 on admit, completed abx and tamiflu -> WBC 9.8 - Now off Bipap>>on RA - Lung exam much improved    2. Acute on chronic Systolic Heart Failure Due to ICM  - Has [employer] single chamber ICD  - Echo 07/10/22: EF 20-25%, global hypokinesis, GIIDD, RV systolic function, mildly reduced. RV size normal. Mild MR, trivial TR, aortic valve w/ mild-mod regurg.  - NYHA class IV on admit  - Empiric milrinone added 12/10 d/t concern for low-output. Milrinone stopped 12/10.  - now well diuresed. Breathing improved. SCr up just slightly at 1.16 but still within her prior baseline - Continue Torsemide 60 mg daily - Continue digoxin 0.125mg  daily, dig level okay this am - Continue Farxiga 10 mg daily.  - Continue spiro 25 mg daily. - Continue Corlanor 7.5 mg bid - Failed Entresto due to hyperkalemia.   - No BP room for imdur/hydralazine - No b-blocker due to intolerance with low output and COPD with frequent flares - Not a candidate for advanced therapies with her degree of lung disease.   - S/p barostim  3. CAD  - Hx of NSTEMI/STEMI s/p Emergent CABG 02/03/18: Cath with severe 2v CAD as above with severe LV dysfunction/ICM.  - s/p Emergent CABG 02/03/18.   - Had NSTEMI and now s/p LHC 02/12/19 with DES to the ostial ramus extending into the distal left main.  - No longer on brillinta or ASA due to bleeding and need for [facility]. - Chest pain 6/10, persistent for the last 5 days (around the same time cough started) Doubt ischemic. HsTrop [phone].  EKG non acute  -> no further ischemic w/u indicated - Continue high-intensity statin. - No s/s angina  4. Dizziness - suspect vertigo - d/w hospitalist. Improving, Meclizine added. - Orthostatics negative - continue to work w/ PT/OT    5. Chronic Hypoxic Respiratory Failure  - Multifactorial, former smoker, h/o small cell lung cancer treated with chemo, chest XRT, COPD and HF - She has  home O2 ([employer]).    6. Recurrent pleural effusions s/p pleurodesis - Small bilateral pleural effusions on CXR 12/12 - Continue diuretics   7. PAF - CHA2DS2/VASc is at least 4. (CHF, Vasc disease, HTN, Female).   - She has history of Afib RVR in the past in the setting of her Lung CA.  - Previously on Xarelto but stopped due to hemoptysis.  - Remains in NSR  - Started on Eliquis in 12/22 due to renal infarct. - Continue Eliquis 2.5 mg bid. No bleeding issues.   8. 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 - Remains on Eliquis.     8. H/o SCLC:  - s/p treatment 2015. Lost to f/u since 04/2015.  - Chest CT 02/19/18 with mass-like consolidation in R hilum  concerning for recurrent tumor.  - Repeat CT 03/27/2018 -No definite findings of locally recurrent tumor in the right lung. 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 Continued chest CT surveillance is advised in 3-6 months. - S/p thoracentesis 04/2018. Cytology negative for malignancy.  - CTA 11/19 stable scarring in the right hilum consistent with emphysema. Bronchoscopy on 11/15, no obvious source of bleeding   9. Nausea/vomiting - unclear etiology. KUB ok  - ? Low output HF. Started empiric milrinone at 0.125 on 12/10. - much improved.  Now off milrinone    GOC: - DNR/DNI  Disposition: -  PT recommending CIR. She is adamant she wants discharge home. Not interested in inpatient rehab. - Discharging with HH. Strength seems to be improving gradually   Okay for discharge from HF perspective.  Has f/u in HF clinic.  HF meds at discharge: -Eliquis 5 mg BID -Atorvastatin 80 mg daily -Farxiga 10 mg daily -Digoxin 0.125 mg daily -Ivabradine 7.5 mg BID -Spironolactone 25 mg daily -Torsemide 60 mg daily -Potassium chloride 20 mEq daily  Length of Stay: [place], LINDSAY N, PA-C  11/24/2022, 7:31 AM  Advanced Heart Failure  Team Pager [phone] (M-F; 7a - 5p)  Please contact [facility] for night-coverage after hours (5p -7a ) and weekends on [email or web address]  Patient seen and examined with the above-signed Advanced Practice Provider and/or Housestaff. I personally reviewed laboratory data, imaging studies and relevant notes. I independently examined the patient and formulated the important aspects of the plan. I have edited the note to reflect any of my changes or salient points. I have personally discussed the plan with the patient and/or family.  Doing well. Breathing better. Walking halls. No further dizziness or n/v  General:  Thin No resp difficulty HEENT: normal Neck: supple. no JVD. Carotids 2+ bilat; no bruits. No lymphadenopathy or thryomegaly appreciated. Cor: PMI nondisplaced. Regular rate & rhythm. No rubs, gallops or murmurs. Lungs: clear but decreased Abdomen: soft, nontender, nondistended. No hepatosplenomegaly. No bruits or masses. Good bowel sounds. Extremities: no cyanosis, clubbing, rash, edema Neuro: alert & orientedx3, cranial nerves grossly intact. moves all 4 extremities w/o difficulty. Affect pleasant  She is much improved. [place] for d/c from HF perspective. Above meds reviewed and agreed. Will set up HF f/u.   Glori Bickers, MD  8:11 AM

## 2022-11-27 ENCOUNTER — Ambulatory Visit (INDEPENDENT_AMBULATORY_CARE_PROVIDER_SITE_OTHER): Payer: BC Managed Care – PPO

## 2022-11-27 DIAGNOSIS — I5022 Chronic systolic (congestive) heart failure: Secondary | ICD-10-CM | POA: Diagnosis not present

## 2022-11-27 DIAGNOSIS — Z95810 Presence of automatic (implantable) cardiac defibrillator: Secondary | ICD-10-CM | POA: Diagnosis not present

## 2022-11-27 NOTE — TOC CM/SW Note (Signed)
HF TOC CM received confirmation from [facility] and [facility], they are unable to service pt for [facility]. [place] for [facility], accepted referral. [place], Heart Failure TOC CM [phone]

## 2022-11-28 NOTE — Progress Notes (Signed)
EPIC Encounter for ICM Monitoring  Patient Name: Brittany [name] is a 61 y.o. female Date: 11/28/2022 Primary Care Physican: Traci Sermon, PA-C Primary Cardiologist: Bensimhon Electrophysiologist: Quentin Ore 05/24/2022 Weight: 101 lbs 08/07/2022 Weight: 101 lbs 09/15/2022 Weight: 104 lbs 11/28/2022 Weight: 103 lbs    Spoke with patient and heart failure questions reviewed.  Transmission results reviewed.  Pt was hospitalized 12/5 for flu and pneumonia but she is feeling much better.     Optivol Thoracic impedance suggesting normal fluid levels starting 12/12.  Impedance was suggesting possible fluid accumulation 11/15-12/11.   Barostim implant.   Prescribed: Torsemide 20 mg take 3 tablets (60 mg total) by mouth once a day. Potassium 20 mEq Take 1 tablet (20 mEq total) by mouth daily. Take extra 34meq when you take higher dose of torsemide or metolazone Spironolactone 25 mg take 1 tablet (25 mg total) by mouth daily Farxiga 10 mg take 1 tablet daily   Labs: 11/14/2022 Creatinine 1.16, BUN 18, Potassium 3.7, Sodium 138, GFR 53 11/23/2022 Creatinine 1.08, BUN 16, Potassium 4.0, Sodium 140, GFR 58, BNP 1271.8 11/22/2022 Creatinine 0.88, BUN 19, Potassium 3.8, Sodium 140, GFR >60, BNP 1130.0 11/21/2022 Creatinine 0.93, BUN 19, Potassium 3.7, Sodium 137, GFR >60 11/20/2022 Creatinine 0.97, BUN 21, Potassium 4.4, Sodium 136, GFR >60, BNP 1081.0 11/19/2022 Creatinine 1.08, BUN 25, Potassium 4.8, Sodium 139, GFR 58, BNP 1554.9 11/19/2023 Creatinine 1.38, BUN 33, Potassium 3.7, Sodium 137, GFR 1265.8  A complete set of results can be found in Results Review.   Recommendations:  No changes and encouraged to call if experiencing any fluid symptoms.   Follow-up plan: ICM clinic phone appointment on 01/01/2023.   91 day device clinic remote transmission 12/20/2022.     EP/Cardiology Office Visits:  12/12/2022 with HF Clinic.      Copy of ICM check sent to Dr. Quentin Ore.   3 month ICM  trend: 11/27/2022.    12-14 Month ICM trend:     [name], RN 11/28/2022 4:31 PM

## 2022-11-29 NOTE — [place] Notes (Signed)
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 Underwent emergent cath 02/03/18 showed LAD 100% stenosed, LCx 95% stenosed. Taken for emergent CABG 02/03/18. Required impella post op. Hospital course complicated by cardiogenic shock, HCAP, A fib, respiratory failure, and swallowing issues. She was discharged to SNF. Discharge weight 103 pounds.    In 2019 had multiple hospitalizations for HF and pleural effusion. Underwent pleurodesis at [facility].   Admitted 3/20 with NSTEMI and HF. Received DES to ostial ramus into distal left main based on cath below and underwent diuresis. Meds adjusted as tolerated. Echo with EF 25-30%.   Admitted 11/21 with COPD flare with mild HF and hemoptysis. CTA showed stable scarring in the right hilum consistent with emphysema.. Underwent bronchoscopy on 11/15, no obvious source of bleeding found on examination.   Echo 11/21 EF 20-25%. RV mildly HK.   Admitted 3/22 Echo with EF < 20%. Treated for AECOPD exacerbation.   Admitted 12/22 with R renal infarct felt to be cardio-embolic (no AF found). Started on Eliquis  Admitted 8/23 w/ a/c respiratory failure and CHF. Concern for low output vs PNA vs COPD exacerbation. PICC placed and milrinone started. Diuresed w/ IV Lasix. Given prednisone taper and doxy for COPD exacerbation. Echo showed EF 20-25%, global hypokinesis, GIIDD, RV systolic function, mildly reduced. RV size normal. Mild MR, trivial TR, aortic valve w/ mild-mod regurg. Drips weaned, GDMT adjusted, discharged home on  higher torsemide dose of 60 mg daily, weight 99 lbs.  Barostim placed.   Admitted 12/23 with COPDE, a/c CHF and influenza +. Placed on BiPap, treated with tamiflu and diuresed with IV lasix. Empirically started milrinone over concern for low output, able to wean off and continue GDMT. PT rec CIR, but she refused and was discharged home with HH, weight 103 lbs.  Today she returns for post hospital HF follow up. Overall feeling fine. Trying to get energy back. No SOB walking on flat ground if she walks slowly. Able to do ADLs and housework if she paces herself. Has positional dizziness, had a fall day after she was discharged when her legs gave out. She did not hit her head. She had to call her neighbor to help pick her up.  Denies palpitations, CP, edema, or PND/Orthopnea. Appetite ok. No fever or chills. Weight at home 103 pounds. Taking all medications.  Has not had to take any metolazone recently, but increased torsemide to 80 mg yesterday over concerns she was gaining fluid.  Cardidac Studies: - Echo (7/23): EF 20-25%, global hypokinesis, GIIDD, RV systolic function, mildly reduced. RV size normal. Mild MR, trivial TR, aortic valve w/ mild-mod regurg  - Echo 1/23: EF 20-25%, severely reduced LV with global HK, grade II DD, normal RV  - Echo 02/10/21: EF <20%, G2DD  - Echo 2022: EF < 20% RV normal   - Echo 2021: EF 20-25% RV mildly reduced.   - Echo 02/12/19: EF 25-30%, RV mildly reduced  - 05/18/2021 PFT's: moderately severe airway obstruction and a diffusion defect suggesting emphysema.   - LHC  02/12/19: Ost LAD to Prox LAD lesion is 100% stenosed. Ost Cx to Prox Cx lesion is 100% stenosed. Ost Ramus lesion is 99% stenosed. Post intervention, there is a 0% residual stenosis. A drug-eluting stent was successfully placed using a [place] 2.25X15. SVG and is normal in caliber. The graft exhibits no disease. Ost 1st Diag lesion is 70% stenosed. SVG and is normal in caliber. Prox  Graft lesion is 100% stenosed. 1.  Significant underlying two-vessel coronary artery disease with patent SVG to OM and SVG to diagonal which supplies the LAD territory.  Atretic LIMA to LAD given competitive flow from SVG to diagonal.  Severe ostial ramus artery stenosis not supplied by bypass with his area suggestive of plaque rupture. 2.  Severely elevated left ventricular end-diastolic pressure 34 mmHg.  Left ventricular angiography was not performed. 3.  Successful angioplasty and drug-eluting stent placement to the ostial ramus extending into the distal left main.  - RHC 2019  RA = 8 RV = 49/9 PA = 47/20 (32) PCW = 19 Fick cardiac output/index = 4.3/3.1 PVR = 3.0 wu AO sat = 93% PA sat = 62%, 64%   1. Minimally elevated left heart pressures 2. Mild PAH 3. Normal cardiac output  Past Medical History:  Diagnosis Date   Acute respiratory failure ([place]) 28/31/5176   Acute systolic congestive heart failure ([place]) 02/03/2018   AICD (automatic cardioverter/defibrillator) present    Allergy    Anxiety    Asthma    DM2 (diabetes mellitus, type 2) ([place]) 10/20/2020   Hypertension    PAF (paroxysmal atrial fibrillation) ([place])    Presence of permanent cardiac pacemaker    Prophylactic measure 08/03/14-08/19/14   Prophyl. 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   LIMA to LAD, SVG to D1, SVG to OM1, EVH via right thigh with implantation of Impella LD LVAD via direct aortic approach   Small cell lung cancer ([place]) 03/16/2014   Past Surgical History:  Procedure Laterality Date   BRONCHIAL BRUSHINGS  10/25/2020   Procedure: BRONCHIAL BRUSHINGS;  Surgeon: Collene Gobble, MD;  Location: [facility] ENDOSCOPY;  Service: Pulmonary;;   BRONCHIAL NEEDLE ASPIRATION BIOPSY  10/25/2020   Procedure: BRONCHIAL NEEDLE ASPIRATION BIOPSIES;  Surgeon: Collene Gobble, MD;  Location: [place] ENDOSCOPY;  Service: Pulmonary;;   CARDIAC DEFIBRILLATOR PLACEMENT  08/15/2018   MDT Visia AF MRI VR ICD  implanted by Dr Loel Lofty for primary prevention of sudden   CESAREAN SECTION     CORONARY ARTERY BYPASS GRAFT N/A 02/03/2018   Procedure: CORONARY ARTERY BYPASS GRAFTING (CABG);  Surgeon: Rexene Alberts, MD;  Location: [place];  Service: Open Heart Surgery;  Laterality: N/A;  Time 3 using left internal mammary artery and endoscopically harvested right saphenous vein   CORONARY BALLOON ANGIOPLASTY N/A 02/03/2018   Procedure: CORONARY BALLOON ANGIOPLASTY;  Surgeon: Martinique, Peter M, MD;  Location: [place] CV LAB;  Service: Cardiovascular;  Laterality: N/A;   CORONARY STENT INTERVENTION N/A 02/12/2019   Procedure: CORONARY STENT INTERVENTION;  Surgeon: Wellington Hampshire, MD;  Location: [place] CV LAB;  Service: Cardiovascular;  Laterality: N/A;   CORONARY/GRAFT ACUTE MI REVASCULARIZATION N/A 02/03/2018   Procedure: Coronary/Graft Acute MI Revascularization;  Surgeon: Martinique, Peter M, MD;  Location: [place] CV LAB;  Service: Cardiovascular;  Laterality: N/A;   ENDOBRONCHIAL ULTRASOUND N/A 10/25/2020   Procedure: ENDOBRONCHIAL ULTRASOUND;  Surgeon: Collene Gobble, MD;  Location: [facility] ENDOSCOPY;  Service: Pulmonary;  Laterality: N/A;  FLEXIBLE BRONCHOSCOPY  10/25/2020   Procedure: FLEXIBLE BRONCHOSCOPY;  Surgeon: Collene Gobble, MD;  Location: [facility] END[facility]COPY;  Service: Pulmonary;;   IABP INSERTION N/A 02/03/2018   Procedure: IABP Insertion;  Surgeon: Martinique, Peter M, MD;  Location: [place] CV LAB;  Service: Cardiovascular;  Laterality: N/A;   INTRAOPERATIVE TRANSESOPHAGEAL ECHOCARDIOGRAM N/A 02/03/2018   Procedure: INTRAOPERATIVE TRANSESOPHAGEAL ECHOCARDIOGRAM;  Surgeon: Rexene Alberts, MD;  Location: [place];  Service: Open Heart Surgery;  Laterality: N/A;   LEFT HEART CATH AND CORONARY ANGIOGRAPHY N/A 02/03/2018   Procedure: LEFT HEART CATH AND CORONARY ANGIOGRAPHY;  Surgeon: Martinique, Peter M, MD;  Location: [place] CV LAB;  Service: Cardiovascular;  Laterality: N/A;   LEFT HEART CATH AND  CORS/GRAFTS ANGIOGRAPHY N/A 02/12/2019   Procedure: LEFT HEART CATH AND CORS/GRAFTS ANGIOGRAPHY;  Surgeon: Wellington Hampshire, MD;  Location: [place] CV LAB;  Service: Cardiovascular;  Laterality: N/A;   MEDIASTINOSCOPY N/A 03/11/2014   Procedure: MEDIASTINOSCOPY;  Surgeon: Melrose Nakayama, MD;  Location: [place];  Service: Thoracic;  Laterality: N/A;   PLACEMENT OF IMPELLA LEFT VENTRICULAR ASSIST DEVICE  02/03/2018   Procedure: PLACEMENT OF [place] LEFT VENTRICULAR ASSIST DEVICE LD;  Surgeon: Rexene Alberts, MD;  Location: [place];  Service: Open Heart Surgery;;   REMOVAL OF [place] LEFT VENTRICULAR ASSIST DEVICE N/A 02/08/2018   Procedure: REMOVAL OF [place] LEFT VENTRICULAR ASSIST DEVICE;  Surgeon: Rexene Alberts, MD;  Location: [place];  Service: Open Heart Surgery;  Laterality: N/A;   RIGHT HEART CATH N/A 02/03/2018   Procedure: RIGHT HEART CATH;  Surgeon: Martinique, Peter M, MD;  Location: [place] CV LAB;  Service: Cardiovascular;  Laterality: N/A;   RIGHT HEART CATH N/A 05/09/2018   Procedure: RIGHT HEART CATH;  Surgeon: Jolaine Artist, MD;  Location: [place] CV LAB;  Service: Cardiovascular;  Laterality: N/A;   TEE WITHOUT CARDIOVERSION N/A 02/08/2018   Procedure: TRANSESOPHAGEAL ECHOCARDIOGRAM (TEE);  Surgeon: Rexene Alberts, MD;  Location: [place];  Service: Open Heart Surgery;  Laterality: N/A;   TUBAL LIGATION     VIDEO BRONCHOSCOPY WITH ENDOBRONCHIAL ULTRASOUND N/A 03/11/2014   Procedure: VIDEO BRONCHOSCOPY WITH ENDOBRONCHIAL ULTRASOUND;  Surgeon: Melrose Nakayama, MD;  Location: [facility];  Service: Thoracic;  Laterality: N/A;   Current Outpatient Medications  Medication Sig Dispense Refill   albuterol (VENTOLIN HFA) 108 (90 Base) MCG/ACT inhaler Inhale 2 puffs into the lungs every 6 (six) hours as needed for wheezing or shortness of breath. 8.5 g 1   apixaban (ELIQUIS) 5 MG TABS tablet Take 1 tablet (5 mg total) by mouth 2 (two) times daily. 60 tablet 1   atorvastatin  (LIPITOR) 80 MG tablet Take 1 tablet (80 mg total) by mouth daily at 6 PM. 30 tablet 1   blood glucose meter kit and supplies KIT Dispense based on patient and insurance preference. Use up to four times daily as directed. 1 each 0   dapagliflozin propanediol (FARXIGA) 10 MG TABS tablet Take 1 tablet by mouth daily. 30 tablet 11   digoxin (LANOXIN) 0.125 MG tablet Take 1 tablet (0.125 mg total) by mouth daily. 30 tablet 5   Glucerna (GLUCERNA) LIQD Take 237 mLs by mouth See admin instructions. Takes 1 bottle twice a week     ipratropium-albuterol (DUONEB) 0.5-2.5 (3) MG/3ML SOLN Take 3 mLs by nebulization every 4 (four) hours as needed (for shortness of breath). 90 mL 3   ivabradine (CORLANOR) 7.5 MG TABS tablet Take 1 tablet (7.5  mg total) by mouth 2 (two) times daily with a meal. TAKE 1 TABLET (7.5 MG TOTAL) BY MOUTH 2 (TWO) TIMES DAILY WITH A MEAL. 180 tablet 1   Multiple Vitamin (MULTIVITAMIN WITH MINERALS) TABS tablet Take 1 tablet by mouth daily.     naphazoline-pheniramine (VISINE) 0.025-0.3 % ophthalmic solution Place 1 drop into both eyes 4 (four) times daily as needed for eye irritation.     nitroGLYCERIN (NITROSTAT) 0.4 MG SL tablet Place 1 tablet (0.4 mg total) under the tongue every 5 (five) minutes as needed for chest pain. 90 tablet 5   potassium chloride SA (KLOR-CON M) 20 MEQ tablet Take 1 tablet (20 mEq total) by mouth daily. TAke extra 89mq when you take higher dose of torsemide or metolazone 45 tablet 6   spironolactone (ALDACTONE) 25 MG tablet Take 1 tablet (25 mg total) by mouth daily. 45 tablet 3   torsemide (DEMADEX) 20 MG tablet Take 3 tablets (60 mg total) by mouth daily. 90 tablet 5   No current facility-administered medications for this encounter.    Allergies:   Codeine   Social History:  The patient  reports that she quit smoking about 8 years ago. Her smoking use included cigarettes. She has a 20.00 pack-year smoking history. She has never used smokeless tobacco. She  reports current alcohol use of about 8.0 standard drinks of alcohol per week. She reports current drug use. Drug: Marijuana.   Family History:  The patient's family history includes Cancer in her maternal grandmother; Diabetes in her paternal grandmother; Heart attack in her mother; Heart disease in her mother; Hypertension in her maternal grandmother and mother.   ROS:  Please see the history of present illness.   All other systems are personally reviewed and negative.    Recent Labs: 11/15/2022: ALT 44 11/23/2022: B Natriuretic Peptide 1,271.8; Hemoglobin 12.5; Platelets 239 11/24/2022: BUN 18; Creatinine, Ser 1.16; Magnesium 2.0; Potassium 3.7; Sodium 138  Personally reviewed   Wt Readings from Last 3 Encounters:  12/12/22 46.7 kg (103 lb)  11/24/22 46.7 kg (102 lb 15.3 oz)  11/08/22 49.5 kg (109 lb 3.2 oz)   BP 114/62   Pulse 78   Wt 46.7 kg (103 lb)   SpO2 100%   BMI 20.80 kg/m   Physical Exam General:  NAD. No resp difficulty, walked into clinic HEENT: Normal Neck: Supple. JVP 7-8 Carotids 2+ bilat; no bruits. No lymphadenopathy or thryomegaly appreciated. Cor: PMI nondisplaced. Regular rate & rhythm. No rubs, gallops or murmurs. Lungs: rhonchi all lung fields Abdomen: Soft, nontender, nondistended. No hepatosplenomegaly. No bruits or masses. Good bowel sounds. Extremities: No cyanosis, clubbing, rash, edema Neuro: Alert & oriented x 3, cranial nerves grossly intact. Moves all 4 extremities w/o difficulty. Affect pleasant.  Device interrogation (personally reviewed): OptiVol up but trending down, thoracic impedence below reference line, 3 hr/day activity, no AF or VT  ECG (personally reviewed): NSR 73 bpm  Assessment & Plan: 1. Chronic Systolic Heart Failure, due to ICM  - Echo 09/04/18 ([facility]) LVEF 20%, Moderate AI, Mild MR, Mild TR, Severe LAE, RV mildly decreased.  - Echo 02/12/19: EF 25-30%, RV mildly reduced - Echo 11/21 EF 20-25% RV mild HK. - Echo 2022 EF < 20% RV  normal - Echo 1/23 EF 20-25% RV ok  - Has Med[employer]onic single chamber ICD  - Echo 07/10/22: EF 20-25%, global hypokinesis, GIIDD, RV systolic function, mildly reduced. RV size normal. Mild MR, trivial TR, aortic valve w/ mild-mod regurg.  - Recent  admit (12/23) with A/C HF, COPD and influenza. Empirically started on milrinone. - NYHA II-III symptoms, volume looks OK on exam, but OptiVol up. - Increase torsemide to 80 mg daily, can use metolazone 2.5 mg PRN. - Continue digoxin 0.125 mg daily. Dig level 0.6 11/24/22 - Continue Farxiga 10 mg daily.  - Continue spiro 25 mg daily. - Continue Corlanor 7.5 mg bid. - Failed Entresto due to hyperkalemia.   - No b-blocker due to intolerance with low output and bad COPD with frequent flares. - Not a candidate for advanced therapies with her degree of lung disease.   - Now with Barostim in place.  - Labs today, repeat BMET in 10-14 days. - I will ask device RN to send transmission in 1 week to follow OptiVol.   2. Chronic Hypoxic Respiratory Failure  - Multifactorial, former smoker, h/o small cell lung cancer treated with chemo, chest XRT, COPD and HF - She has home O2 ([employer]) & BiPap.   3. CAD  - Hx of NSTEMI/STEMI s/p Emergent CABG 02/03/18: Cath with severe 2v CAD as above with severe LV dysfunction/ICM.  - s/p Emergent CABG 02/03/18.   - Had NSTEMI and now s/p LHC 02/12/19 with DES to the ostial ramus extending into the distal left main.  - No longer on brillinta or ASA due to bleeding, and need for [facility]. - No s/s angina.  - Continue hi-intensity statin.   4. COPD w/ recent exacerbation - Recent exacerbation with + influenza a.  - No fevers/chills. - Not using Bipap. - Lungs rhonchorous today, may need another round of steroids.   5. Recurrent pleural effusions s/p pleurodesis - Resolved. Recent CXR ok   6. PAF - CHA2DS2/VASc is at least 4. (CHF, Vasc disease, HTN, Female).   - She has history of Afib RVR in the past in the setting of her Lung  CA.  - Previously on Xarelto but stopped due to hemoptysis.  - Started on Eliquis in 12/22 due to renal infarct. - Remains in NSR - Continue Eliquis 2.5 mg bid. No bleeding issues. - CBC today.   7. 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 - Remains on Eliquis.     8. H/o SCLC:  - s/p treatment 2015. Lost to f/u since 04/2015.  - Chest CT 02/19/18 with mass-like consolidation in R hilum concerning for recurrent tumor.  - Repeat CT 03/27/2018 -No definite findings of locally recurrent tumor in the right lung. 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 Continued chest CT surveillance is advised in 3-6 months. - S/p thoracentesis 04/2018. Cytology negative for malignancy.  - CTA 11/19 stable scarring in the right hilum consistent with emphysema. Bronchoscopy on 11/15, no obvious source of bleeding  9. Recent fall - She has declined HH PT - Discussed fall precautions, especially in setting of recent hospitalization and on [facility]  Keep follow up with Dr. [name] 03/07/23, as scheduled.   Rafael Bihari, FNP  3:01 PM

## 2022-12-12 ENCOUNTER — Ambulatory Visit (HOSPITAL_COMMUNITY)
Admit: 2022-12-12 | Discharge: 2022-12-12 | Disposition: A | Discharge status: Home / Self Care | Payer: BC Managed Care – PPO | Attending: Family Medicine | Admitting: Family Medicine

## 2022-12-12 ENCOUNTER — Encounter (HOSPITAL_COMMUNITY): Payer: Self-pay

## 2022-12-12 VITALS — BP 114/62 | HR 78 | Wt 103.0 lb

## 2022-12-12 DIAGNOSIS — Z7901 Long term (current) use of anticoagulants: Secondary | ICD-10-CM | POA: Insufficient documentation

## 2022-12-12 DIAGNOSIS — L905 Scar conditions and fibrosis of skin: Secondary | ICD-10-CM | POA: Diagnosis not present

## 2022-12-12 DIAGNOSIS — I2510 Atherosclerotic heart disease of native coronary artery without angina pectoris: Secondary | ICD-10-CM

## 2022-12-12 DIAGNOSIS — N280 Ischemia and infarction of kidney: Secondary | ICD-10-CM

## 2022-12-12 DIAGNOSIS — Z951 Presence of aortocoronary bypass graft: Secondary | ICD-10-CM | POA: Diagnosis not present

## 2022-12-12 DIAGNOSIS — Z9889 Other specified postprocedural states: Secondary | ICD-10-CM | POA: Diagnosis not present

## 2022-12-12 DIAGNOSIS — J9611 Chronic respiratory failure with hypoxia: Secondary | ICD-10-CM

## 2022-12-12 DIAGNOSIS — Z79899 Other long term (current) drug therapy: Secondary | ICD-10-CM | POA: Insufficient documentation

## 2022-12-12 DIAGNOSIS — Z87891 Personal history of nicotine dependence: Secondary | ICD-10-CM | POA: Diagnosis not present

## 2022-12-12 DIAGNOSIS — Z923 Personal history of irradiation: Secondary | ICD-10-CM | POA: Insufficient documentation

## 2022-12-12 DIAGNOSIS — I110 Hypertensive heart disease with heart failure: Secondary | ICD-10-CM | POA: Insufficient documentation

## 2022-12-12 DIAGNOSIS — Z9181 History of falling: Secondary | ICD-10-CM

## 2022-12-12 DIAGNOSIS — E875 Hyperkalemia: Secondary | ICD-10-CM | POA: Diagnosis not present

## 2022-12-12 DIAGNOSIS — J90 Pleural effusion, not elsewhere classified: Secondary | ICD-10-CM | POA: Diagnosis not present

## 2022-12-12 DIAGNOSIS — J440 Chronic obstructive pulmonary disease with acute lower respiratory infection: Secondary | ICD-10-CM | POA: Diagnosis not present

## 2022-12-12 DIAGNOSIS — J449 Chronic obstructive pulmonary disease, unspecified: Secondary | ICD-10-CM

## 2022-12-12 DIAGNOSIS — I252 Old myocardial infarction: Secondary | ICD-10-CM | POA: Diagnosis not present

## 2022-12-12 DIAGNOSIS — R042 Hemoptysis: Secondary | ICD-10-CM | POA: Insufficient documentation

## 2022-12-12 DIAGNOSIS — I480 Paroxysmal atrial fibrillation: Secondary | ICD-10-CM | POA: Diagnosis not present

## 2022-12-12 DIAGNOSIS — I5022 Chronic systolic (congestive) heart failure: Secondary | ICD-10-CM | POA: Diagnosis present

## 2022-12-12 DIAGNOSIS — C3490 Malignant neoplasm of unspecified part of unspecified bronchus or lung: Secondary | ICD-10-CM

## 2022-12-12 LAB — BASIC METABOLIC PANEL
Anion gap: 11 (ref 5–15)
BUN: 19 mg/dL (ref 8–23)
CO2: 28 mmol/L (ref 22–32)
Calcium: 9.6 mg/dL (ref 8.9–10.3)
Chloride: 99 mmol/L (ref 98–111)
Creatinine, Ser: 1.48 mg/dL — ABNORMAL HIGH (ref 0.44–1.00)
GFR, Estimated: 40 mL/min — ABNORMAL LOW (ref >60)
Glucose, Bld: 121 mg/dL — ABNORMAL HIGH (ref 70–99)
Potassium: 2.9 mmol/L — ABNORMAL LOW (ref 3.5–5.1)
Sodium: 138 mmol/L (ref 135–145)

## 2022-12-12 LAB — CBC
HCT: 38.9 % (ref 36.0–46.0)
Hemoglobin: 12.4 g/dL (ref 12.0–15.0)
MCH: 29.3 pg (ref 26.0–34.0)
MCHC: 31.9 g/dL (ref 30.0–36.0)
MCV: 92 fL (ref 80.0–100.0)
Platelets: 202 10*3/uL (ref 150–400)
RBC: 4.23 MIL/uL (ref 3.87–5.11)
RDW: 19.9 % — ABNORMAL HIGH (ref 11.5–15.5)
WBC: 5.9 10*3/uL (ref 4.0–10.5)
nRBC: 0 % (ref 0.0–0.2)

## 2022-12-12 LAB — BRAIN NATRIURETIC PEPTIDE: B Natriuretic Peptide: 1108.4 pg/mL — ABNORMAL HIGH (ref 0.0–100.0)

## 2022-12-12 MED ORDER — TORSEMIDE 20 MG PO TABS: 80.0000 mg | ORAL_TABLET | Freq: Two times a day (BID) | ORAL | 8 refills | Status: DC

## 2022-12-12 NOTE — Patient Instructions (Signed)
Thank you for coming in today  Labs were done today, if any labs are abnormal the clinic will call you No news is good news  INCREASE Torsemide to 80 mg 4 tablets daily   Your physician recommends that you return for lab work in:  2 weeks BMET   Your physician recommends that you schedule a follow-up appointment in:  Keep follow up with Dr. [name]     Do the following things EVERYDAY: Weigh yourself in the morning before breakfast. Write it down and keep it in a log. Take your medicines as prescribed Eat low salt foods--Limit salt (sodium) to 2000 mg per day.  Stay as active as you can everyday Limit all fluids for the day to less than 2 liters   At the [facility], you and your health needs are our priority. As part of our continuing mission to provide you with exceptional heart care, we have created designated Provider Care Teams. These Care Teams include your primary Cardiologist (physician) and Advanced Practice Providers (APPs- Physician Assistants and Nurse Practitioners) who all work together to provide you with the care you need, when you need it.   You may see any of the following providers on your designated Care Team at your next follow up: Dr Glori Bickers Dr Loralie Champagne Dr. Roxana Hires, NP Lyda Jester, [place] [facility] [place], [place] Forestine Na, NP Audry Riles, PharmD   Please be sure to bring in all your medications bottles to every appointment.   If you have any questions or concerns before your next appointment please send [place] a message through [place] or call our office at [phone].    TO LEAVE A MESSAGE FOR THE NURSE SELECT OPTION 2, PLEASE LEAVE A MESSAGE INCLUDING: YOUR NAME DATE OF BIRTH CALL BACK NUMBER REASON FOR CALL**this is important as we prioritize the call backs  YOU WILL RECEIVE A CALL BACK THE SAME DAY AS LONG AS YOU CALL BEFORE 4:00 PM

## 2022-12-13 ENCOUNTER — Telehealth (HOSPITAL_COMMUNITY): Payer: Self-pay

## 2022-12-13 NOTE — Telephone Encounter (Signed)
Patient informed of labs and medication changes. She is already scheduled for labs on the 16th. Do you want to move to next week or keep current appointment for recheck?

## 2022-12-13 NOTE — Telephone Encounter (Signed)
Rescheduled labs for a week earlier.

## 2022-12-19 ENCOUNTER — Ambulatory Visit (HOSPITAL_COMMUNITY)
Admission: RE | Admit: 2022-12-19 | Discharge: 2022-12-19 | Disposition: A | Discharge status: Home / Self Care | Payer: BC Managed Care – PPO | Source: Ambulatory Visit | Attending: Cardiology | Admitting: Cardiology

## 2022-12-19 DIAGNOSIS — I5022 Chronic systolic (congestive) heart failure: Secondary | ICD-10-CM | POA: Insufficient documentation

## 2022-12-19 LAB — BASIC METABOLIC PANEL
Anion gap: 13 (ref 5–15)
BUN: 14 mg/dL (ref 8–23)
CO2: 28 mmol/L (ref 22–32)
Calcium: 9.8 mg/dL (ref 8.9–10.3)
Chloride: 97 mmol/L — ABNORMAL LOW (ref 98–111)
Creatinine, Ser: 1.28 mg/dL — ABNORMAL HIGH (ref 0.44–1.00)
GFR, Estimated: 47 mL/min — ABNORMAL LOW (ref >60)
Glucose, Bld: 131 mg/dL — ABNORMAL HIGH (ref 70–99)
Potassium: 3.4 mmol/L — ABNORMAL LOW (ref 3.5–5.1)
Sodium: 138 mmol/L (ref 135–145)

## 2022-12-20 ENCOUNTER — Telehealth: Payer: Self-pay

## 2022-12-20 ENCOUNTER — Ambulatory Visit (INDEPENDENT_AMBULATORY_CARE_PROVIDER_SITE_OTHER): Payer: BC Managed Care – PPO

## 2022-12-20 DIAGNOSIS — I255 Ischemic cardiomyopathy: Secondary | ICD-10-CM | POA: Diagnosis not present

## 2022-12-20 DIAGNOSIS — Z95810 Presence of automatic (implantable) cardiac defibrillator: Secondary | ICD-10-CM

## 2022-12-20 DIAGNOSIS — I5022 Chronic systolic (congestive) heart failure: Secondary | ICD-10-CM

## 2022-12-20 LAB — CUP PACEART REMOTE DEVICE CHECK
Battery Remaining Longevity: 96 mo
Battery Voltage: 3.01 V
Brady Statistic RV Percent Paced: 0.01 %
Date Time Interrogation Session: 20240110144547
HighPow Impedance: 58 Ohm
Implantable Lead Connection Status: 753985
Implantable Lead Implant Date: 20190905
Implantable Lead Location: 753860
Implantable Pulse Generator Implant Date: 20190905
Lead Channel Impedance Value: 323 Ohm
Lead Channel Impedance Value: 437 Ohm
Lead Channel Pacing Threshold Amplitude: 0.75 V
Lead Channel Pacing Threshold Pulse Width: 0.4 ms
Lead Channel Sensing Intrinsic Amplitude: 19.5 mV
Lead Channel Sensing Intrinsic Amplitude: 19.5 mV
Lead Channel Setting Pacing Amplitude: 2 V
Lead Channel Setting Pacing Pulse Width: 0.4 ms
Lead Channel Setting Sensing Sensitivity: 0.3 mV
Zone Setting Status: 755011
Zone Setting Status: 755011

## 2022-12-20 MED ORDER — POTASSIUM CHLORIDE CRYS ER 10 MEQ PO TBCR: 40.0000 meq | EXTENDED_RELEASE_TABLET | Freq: Every day | ORAL | 1 refills | Status: DC

## 2022-12-20 NOTE — Telephone Encounter (Signed)
Pt agreed to send a transmission with her remote monitor.

## 2022-12-20 NOTE — [place] Notes (Signed)
EPIC Encounter for ICM Monitoring  Patient Name: Brittany [name] is a 62 y.o. female Date: 12/20/2022 Primary Care Physican: Traci Sermon, PA-C Primary Cardiologist: Bensimhon Electrophysiologist: Quentin Ore 05/24/2022 Weight: 101 lbs 08/07/2022 Weight: 101 lbs 09/15/2022 Weight: 104 lbs 11/28/2022 Weight: 103 lbs 12/20/2022 Weight: 100 lbs    Spoke with patient and heart failure questions reviewed.  Transmission results reviewed.  Pt reports fluid symptoms have resolved since 1/2 HF OV.    Since 1/2 HF clinic OV.  Patient has been taking Torsemide 80 mg daily and Potassium 20 mEq 1 tablet twice a day.   She needs a refill on Potassium  Optivol Thoracic impedance suggesting possible fluid accumulation starting and returning to baseline normal on 1/10.   Barostim implant.   Prescribed: Torsemide 20 mg take 4 tablets (80 mg total) by mouth twice a day.  12/12/2022 OV note with Allena Katz, NP states to take 80 mg daily.   Potassium 20 mEq Take 1 tablet (20 mEq total) by mouth daily. Take extra 6meq when you take higher dose of torsemide or metolazone Spironolactone 25 mg take 1 tablet (25 mg total) by mouth daily Farxiga 10 mg take 1 tablet daily   Labs: 12/19/2022 Creatinine 1.28, BUN 14, Potassium 3.4, Sodium 138, GFR 47  12/12/2022 Creatinine 1.48, BUN 19, Potassium 2.9, Sodium 138, GFR 40  11/24/2022 Creatinine 1.16, BUN 18, Potassium 3.7, Sodium 138, GFR 53 11/23/2022 Creatinine 1.08, BUN 16, Potassium 4.0, Sodium 140, GFR 58  11/22/2022 Creatinine 0.88, BUN 19, Potassium 3.8, Sodium 140, GFR >60 11/21/2022 Creatinine 0.93, BUN 19, Potassium 3.7, Sodium 137, GFR >60  11/20/2022 Creatinine 0.97, BUN 21, Potassium 4.4, Sodium 136, GFR >60  11/19/2022 Creatinine 1.08, BUN 25, Potassium 4.8, Sodium 139, GFR 58  11/18/2022 Creatinine 1.38, BUN 33, Potassium 3.7, Sodium 137, GFR 43  11/17/2022 Creatinine 1.26, BUN 30, Potassium 4.2, Sodium 137, GFR 48  11/16/2022 Creatinine  1.18, BUN 28, Potassium 3.4, Sodium 139, GFR 52  11/15/2022 Creatinine 1.51, BUN 28, Potassium 4.0, Sodium 141, GFR 39  11/14/2022 Creatinine 1.16, BUN 18, Potassium 3.7, Sodium 138, GFR 53 A complete set of results can be found in Results Review.   Recommendations:  Copy to [facility] for review and recommendations regarding the following:  Should she continue taking Torsemide 80 mg DAILY (EPIC DOSAGE states 80 mg twice a day)? Should she continue with Potassium 20 mEq twice a day (EPIC DOSAGE has 1 tablet daily but add 1 tablet if taking higher Torsemide dosage)?   Pt reports she needs Potassium refill but has a hard time swallowing 20 mEq tablets.          Would you prescribe 10 mEq tablets, if so what dosage?      Follow-up plan: ICM clinic phone appointment on 12/26/2022 to recheck fluid levels.   91 day device clinic remote transmission 12/20/2022.     EP/Cardiology Office Visits:  03/07/2023 with Dr [name].      Copy of ICM check sent to Dr. Quentin Ore.    3 month ICM trend: 12/20/2022.    12-14 Month ICM trend:     [name], RN 12/20/2022 2:59 PM

## 2022-12-20 NOTE — [place] Notes (Signed)
  Received: Today [name], Brittany Bo, FNP  Hairo Garraway Panda, RN OK to change to 10 meq if difficultly swallowing the 20s.  She actually needs to be on 40 meq (4 tabs) daily. Needs to take extra 40 meq when she takes metolazone doses PRN

## 2022-12-20 NOTE — [place] Notes (Signed)
  Received: Today [name], Maricela Bo, FNP  Craige Patel Panda, RN No med change. Thank you!       Message sent back to ask if Potassium tablets can be changed to 10 mEq tablets with same dosage as currently prescribed.

## 2022-12-21 ENCOUNTER — Telehealth (HOSPITAL_COMMUNITY): Payer: Self-pay

## 2022-12-21 NOTE — Telephone Encounter (Signed)
Patient aware and agreeable. 

## 2022-12-26 ENCOUNTER — Other Ambulatory Visit (HOSPITAL_COMMUNITY): Payer: BC Managed Care – PPO

## 2022-12-26 ENCOUNTER — Ambulatory Visit (INDEPENDENT_AMBULATORY_CARE_PROVIDER_SITE_OTHER): Payer: BC Managed Care – PPO

## 2022-12-26 DIAGNOSIS — I5022 Chronic systolic (congestive) heart failure: Secondary | ICD-10-CM

## 2022-12-26 DIAGNOSIS — Z95810 Presence of automatic (implantable) cardiac defibrillator: Secondary | ICD-10-CM

## 2022-12-26 MED ORDER — POTASSIUM CHLORIDE CRYS ER 10 MEQ PO TBCR: EXTENDED_RELEASE_TABLET | ORAL | 1 refills | Status: DC

## 2022-12-26 MED ORDER — TORSEMIDE 20 MG PO TABS: ORAL_TABLET | ORAL | 1 refills | Status: DC

## 2022-12-26 NOTE — [place] Notes (Signed)
  Received: Today Bowman, Anderson Malta, FNP  Mckinnley Smithey, Josephine Igo, RN Thanks Jacki Cones,  She can increase to torsemide to 80 mg q am and add 40 mg q pm. She will need to take extra 20 mEq KCL daily, with repeat BMET in 10 days please.

## 2022-12-26 NOTE — Progress Notes (Signed)
EPIC Encounter for ICM Monitoring  Patient Name: Brittany [name] is a 62 y.o. female Date: 12/26/2022 Primary Care Physican: Alyson Ingles, PA-C Primary Cardiologist: Bensimhon Electrophysiologist: Lalla Brothers 05/24/2022 Weight: 101 lbs 08/07/2022 Weight: 101 lbs 09/15/2022 Weight: 104 lbs 11/28/2022 Weight: 103 lbs 12/20/2022 Weight: 100 lbs   Spoke with patient and heart failure questions reviewed.  Transmission results reviewed.  Pt asymptomatic for fluid accumulation.  She has been strict with diet and limiting fluid intake to 64 oz daily.     Since 1/2 HF clinic OV.  Patient is taking Torsemide 80 mg daily.   Optivol Thoracic impedance suggesting possible fluid accumulation RETURNED after getting to baseline on 1/10.  Fluid index > normal threshold starting 11/29.  Barostim implant.   Prescribed: Torsemide 20 mg take 4 tablets (80 mg total) by mouth twice a day.  12/12/2022 OV note with [name], NP states to take 80 mg daily.   Potassium 10 mEq Take 4 tablet (40 mEq total) by mouth daily. Take extra 40 mEq when you take metolazone Spironolactone 25 mg take 1 tablet (25 mg total) by mouth daily Farxiga 10 mg take 1 tablet daily   Labs: 12/19/2022 Creatinine 1.28, BUN 14, Potassium 3.4, Sodium 138, GFR 47 (resulting in an increase in K+ dosage) 12/12/2022 Creatinine 1.48, BUN 19, Potassium 2.9, Sodium 138, GFR 40  11/24/2022 Creatinine 1.16, BUN 18, Potassium 3.7, Sodium 138, GFR 53 11/23/2022 Creatinine 1.08, BUN 16, Potassium 4.0, Sodium 140, GFR 58  11/22/2022 Creatinine 0.88, BUN 19, Potassium 3.8, Sodium 140, GFR >60 11/21/2022 Creatinine 0.93, BUN 19, Potassium 3.7, Sodium 137, GFR >60  A complete set of results can be found in Results Review.   Recommendations:  Confirmed she takes Torsemide 80 mg daily, Potassium 40 mEq daily, limiting salt and fluid as recommended.  Copy sent to [name], NP HF clinic for review and recommendations if needed.      Follow-up plan: ICM clinic phone appointment on 01/01/2023 (manual) for 31 day & fluid level recheck.   91 day device clinic remote transmission 03/21/2023.     EP/Cardiology Office Visits:  03/07/2023 with Dr [name].      Copy of ICM check sent to Dr. Lalla Brothers.    3 month ICM trend: 12/26/2022.    12-14 Month ICM trend:     Karie Soda, RN 12/26/2022 7:58 AM

## 2022-12-26 NOTE — Progress Notes (Signed)
Spoke with patient and advised [name], NP at HF clinic recommended to take Torsemide to 80 mg q am and add 40 mg q pm. Add extra 20 mEq KCL daily, with repeat BMET in 10 days please.  She verbalized understanding and agreed to plan.  BMET scheduled for 01/05/2023.  She will call if she has changes in condition or difficulty taking increased Torsemide dosage.  She has Torsemide and Potassium on hand and will call when refill is needed. Script updated in Epic.

## 2023-01-01 ENCOUNTER — Ambulatory Visit: Payer: BC Managed Care – PPO | Attending: Cardiology

## 2023-01-05 ENCOUNTER — Telehealth: Payer: Self-pay

## 2023-01-05 ENCOUNTER — Other Ambulatory Visit (HOSPITAL_COMMUNITY): Payer: BC Managed Care – PPO

## 2023-01-05 NOTE — Telephone Encounter (Signed)
Spoke with patient and advised to send manual remote transmission on 1/29 for fluid levels review and she agreed.

## 2023-01-08 ENCOUNTER — Ambulatory Visit: Payer: BC Managed Care – PPO | Attending: Cardiology

## 2023-01-08 DIAGNOSIS — I5022 Chronic systolic (congestive) heart failure: Secondary | ICD-10-CM

## 2023-01-08 DIAGNOSIS — Z95810 Presence of automatic (implantable) cardiac defibrillator: Secondary | ICD-10-CM

## 2023-01-08 NOTE — Progress Notes (Signed)
EPIC Encounter for ICM Monitoring  Patient Name: Brittany [name] is a 62 y.o. female Date: 01/08/2023 Primary Care Physican: Traci Sermon, PA-C Primary Cardiologist: Bensimhon Electrophysiologist: Quentin Ore 05/24/2022 Weight: 101 lbs 08/07/2022 Weight: 101 lbs 09/15/2022 Weight: 104 lbs 11/28/2022 Weight: 103 lbs 12/20/2022 Weight: 100 lbs   Spoke with patient and heart failure questions reviewed.  Transmission results reviewed.  Pt asymptomatic for fluid accumulation.  She has been strict with diet and limiting fluid intake to 64 oz daily.   She missed her 10 day BMET (per Janett Billow) lab appointment on 1/26 and rescheduled for 2/1.   Optivol Thoracic impedance suggesting fluid levels RETURNED close to normal after adding 40 mg Furosemide dosage in the afternoon.  Fluid index > normal threshold starting 11/29.  Barostim implant.   Prescribed: Torsemide 20 mg take 4 tablets (80 mg total) by mouth every morning and 2 tablets (40 mg total) every evening.  Potassium 10 mEq Take 4 tablet (40 mEq total) by mouth every morning and 2 tablets (20 mEq total) every evening.  Take extra 40 mEq when you take metolazone Spironolactone 25 mg take 1 tablet (25 mg total) by mouth daily Farxiga 10 mg take 1 tablet daily   Labs: 01/11/2023 BMET scheduled (pt missed 1/26 lab appointment)  12/19/2022 Creatinine 1.28, BUN 14, Potassium 3.4, Sodium 138, GFR 47 (resulting in an increase in K+ dosage) 12/12/2022 Creatinine 1.48, BUN 19, Potassium 2.9, Sodium 138, GFR 40  11/24/2022 Creatinine 1.16, BUN 18, Potassium 3.7, Sodium 138, GFR 53 11/23/2022 Creatinine 1.08, BUN 16, Potassium 4.0, Sodium 140, GFR 58  11/22/2022 Creatinine 0.88, BUN 19, Potassium 3.8, Sodium 140, GFR >60 11/21/2022 Creatinine 0.93, BUN 19, Potassium 3.7, Sodium 137, GFR >60  A complete set of results can be found in Results Review.   Recommendations:  Advised to continue with current Torsemide dosage and to call for any  changes.     Follow-up plan: ICM clinic phone appointment on 01/15/2023 fluid level recheck.   91 day device clinic remote transmission 03/21/2023.     EP/Cardiology Office Visits:  03/07/2023 with Dr [name].      Copy of ICM check sent to Dr. Quentin Ore.     3 month ICM trend: 01/08/2023.    12-14 Month ICM trend:     [name], RN 01/08/2023 3:39 PM

## 2023-01-11 ENCOUNTER — Other Ambulatory Visit (HOSPITAL_COMMUNITY): Payer: BC Managed Care – PPO

## 2023-01-11 NOTE — Progress Notes (Signed)
Remote ICD transmission.   

## 2023-01-15 ENCOUNTER — Ambulatory Visit (HOSPITAL_COMMUNITY)
Admission: RE | Admit: 2023-01-15 | Discharge: 2023-01-15 | Disposition: A | Discharge status: Home / Self Care | Payer: BC Managed Care – PPO | Source: Ambulatory Visit | Attending: Cardiology | Admitting: Cardiology

## 2023-01-15 ENCOUNTER — Ambulatory Visit (INDEPENDENT_AMBULATORY_CARE_PROVIDER_SITE_OTHER): Payer: BC Managed Care – PPO

## 2023-01-15 ENCOUNTER — Telehealth (HOSPITAL_COMMUNITY): Payer: Self-pay

## 2023-01-15 DIAGNOSIS — I5022 Chronic systolic (congestive) heart failure: Secondary | ICD-10-CM

## 2023-01-15 DIAGNOSIS — Z95810 Presence of automatic (implantable) cardiac defibrillator: Secondary | ICD-10-CM

## 2023-01-15 LAB — BASIC METABOLIC PANEL
Anion gap: 10 (ref 5–15)
BUN: 19 mg/dL (ref 8–23)
CO2: 26 mmol/L (ref 22–32)
Calcium: 9.9 mg/dL (ref 8.9–10.3)
Chloride: 99 mmol/L (ref 98–111)
Creatinine, Ser: 1.37 mg/dL — ABNORMAL HIGH (ref 0.44–1.00)
GFR, Estimated: 44 mL/min — ABNORMAL LOW (ref >60)
Glucose, Bld: 135 mg/dL — ABNORMAL HIGH (ref 70–99)
Potassium: 3.4 mmol/L — ABNORMAL LOW (ref 3.5–5.1)
Sodium: 135 mmol/L (ref 135–145)

## 2023-01-15 MED ORDER — POTASSIUM CHLORIDE CRYS ER 10 MEQ PO TBCR: 40.0000 meq | EXTENDED_RELEASE_TABLET | Freq: Two times a day (BID) | ORAL | 5 refills | Status: DC

## 2023-01-15 NOTE — Telephone Encounter (Signed)
-----   Message from Rafael Bihari, [place] sent at 01/15/2023  4:40 PM EST ----- K remains low. Increase KCL to 40 bid. BMEt in 2 weeks. Remember to take extra 40 when taking metolazone

## 2023-01-15 NOTE — Telephone Encounter (Signed)
Patient advised and verbalized understanding,lab appointment scheduled,lab orders entered. Med list updated to reflect changes.   Meds ordered this encounter  Medications   potassium chloride (KLOR-CON M) 10 MEQ tablet    Sig: Take 4 tablets (40 mEq total) by mouth 2 (two) times daily. Take extra 4 tablets (40 meq total) when you take PRN metolazone.    Dispense:  285 tablet    Refill:  5    Please cancel all previous orders for current medication. Change in dosage or pill size.   Orders Placed This Encounter  Procedures   Basic metabolic panel    Standing Status:   Future    Standing Expiration Date:   01/16/2024    Order Specific Question:   Release to patient    Answer:   Immediate    Order Specific Question:   Release to patient    Answer:   Immediate [1]

## 2023-01-16 ENCOUNTER — Telehealth: Payer: Self-pay

## 2023-01-16 NOTE — Telephone Encounter (Signed)
Remote ICM transmission received.  Attempted call to patient regarding ICM remote transmission and no answer.  

## 2023-01-16 NOTE — Progress Notes (Signed)
EPIC Encounter for ICM Monitoring  Patient Name: Brittany [name] is a 62 y.o. female Date: 01/16/2023 Primary Care Physican: Traci Sermon, PA-C Primary Cardiologist: Bensimhon Electrophysiologist: Quentin Ore 05/24/2022 Weight: 101 lbs 08/07/2022 Weight: 101 lbs 09/15/2022 Weight: 104 lbs 11/28/2022 Weight: 103 lbs 12/20/2022 Weight: 100 lbs   Spoke with patient and heart failure questions reviewed.  Transmission results reviewed.  Pt asymptomatic for fluid accumulation. She is very frustrated with Potassium levels still being low even though Potassium dosage continue to be low and fluid levels being unstable.  She is following low salt diet.      Optivol Thoracic impedance suggesting fluid levels trending closer to normal.  Fluid index > normal threshold starting 11/29.  Barostim implant.   Prescribed: Torsemide 20 mg take 4 tablets (80 mg total) by mouth every morning and 2 tablets (40 mg total) every evening.  Potassium 10 mEq Take 4 tablet (40 mEq total) by mouth twice a day.  Take extra 40 mEq when you take metolazone Spironolactone 25 mg take 1 tablet (25 mg total) by mouth daily Farxiga 10 mg take 1 tablet daily   Labs: 01/15/2023 Creatinine 1.37, BUN 19, Potassium 3.4, Sodium 135, GFR 44 12/19/2022 Creatinine 1.28, BUN 14, Potassium 3.4, Sodium 138, GFR 47 (resulting in an increase in K+ dosage) 12/12/2022 Creatinine 1.48, BUN 19, Potassium 2.9, Sodium 138, GFR 40  11/24/2022 Creatinine 1.16, BUN 18, Potassium 3.7, Sodium 138, GFR 53 11/23/2022 Creatinine 1.08, BUN 16, Potassium 4.0, Sodium 140, GFR 58  11/22/2022 Creatinine 0.88, BUN 19, Potassium 3.8, Sodium 140, GFR >60 11/21/2022 Creatinine 0.93, BUN 19, Potassium 3.7, Sodium 137, GFR >60  A complete set of results can be found in Results Review.   Recommendations:  She is going call HF clinic to let them know how she is feeling.     Follow-up plan: ICM clinic phone appointment on 01/22/2023 fluid level recheck.    91 day device clinic remote transmission 03/21/2023.     EP/Cardiology Office Visits:  03/07/2023 with Dr [name].      Copy of ICM check sent to Dr. Quentin Ore.   3 month ICM trend: 01/15/2023.    12-14 Month ICM trend:     [name], RN 01/16/2023 4:51 PM

## 2023-01-19 ENCOUNTER — Encounter: Payer: Self-pay | Admitting: Internal Medicine

## 2023-01-22 ENCOUNTER — Ambulatory Visit: Payer: BC Managed Care – PPO | Attending: Cardiology

## 2023-01-22 DIAGNOSIS — I5022 Chronic systolic (congestive) heart failure: Secondary | ICD-10-CM

## 2023-01-22 DIAGNOSIS — Z95810 Presence of automatic (implantable) cardiac defibrillator: Secondary | ICD-10-CM

## 2023-01-23 NOTE — Progress Notes (Signed)
EPIC Encounter for ICM Monitoring  Patient Name: Brittany [name] is a 62 y.o. female Date: 01/23/2023 Primary Care Physican: Traci Sermon, PA-C Primary Cardiologist: Bensimhon Electrophysiologist: Quentin Ore 05/24/2022 Weight: 101 lbs 08/07/2022 Weight: 101 lbs 09/15/2022 Weight: 104 lbs 11/28/2022 Weight: 103 lbs 12/20/2022 Weight: 100 lbs   Spoke with patient and heart failure questions reviewed.  Transmission results reviewed.  Pt asymptomatic for fluid accumulation.  Pt is feeling better and mood has improved.     Optivol Thoracic impedance suggesting fluid levels remain unchanged from last week.  Fluid index > normal threshold starting 11/29.  Barostim implant.   Prescribed: Torsemide 20 mg take 4 tablets (80 mg total) by mouth every morning and 2 tablets (40 mg total) every evening.  Potassium 10 mEq Take 4 tablet (40 mEq total) by mouth twice a day.  Take extra 40 mEq when you take metolazone Spironolactone 25 mg take 1 tablet (25 mg total) by mouth daily Farxiga 10 mg take 1 tablet daily   Labs: 01/29/2023 BMET scheduled 01/15/2023 Creatinine 1.37, BUN 19, Potassium 3.4, Sodium 135, GFR 44 12/19/2022 Creatinine 1.28, BUN 14, Potassium 3.4, Sodium 138, GFR 47 (resulting in an increase in K+ dosage) 12/12/2022 Creatinine 1.48, BUN 19, Potassium 2.9, Sodium 138, GFR 40  11/24/2022 Creatinine 1.16, BUN 18, Potassium 3.7, Sodium 138, GFR 53 11/23/2022 Creatinine 1.08, BUN 16, Potassium 4.0, Sodium 140, GFR 58  11/22/2022 Creatinine 0.88, BUN 19, Potassium 3.8, Sodium 140, GFR >60 11/21/2022 Creatinine 0.93, BUN 19, Potassium 3.7, Sodium 137, GFR >60  A complete set of results can be found in Results Review.   Recommendations:   Recommendation to limit salt intake.  Encouraged to call if experiencing any fluid symptoms.    Follow-up plan: ICM clinic phone appointment on 01/30/2023 to recheck fluid levels.   91 day device clinic remote transmission 03/21/2023.      EP/Cardiology Office Visits:  03/07/2023 with Dr [name].      Copy of ICM check sent to Dr. Quentin Ore.    3 month ICM trend: 01/22/2023.    12-14 Month ICM trend:     [name], RN 01/23/2023 12:33 PM

## 2023-01-29 ENCOUNTER — Ambulatory Visit (HOSPITAL_COMMUNITY)
Admission: RE | Admit: 2023-01-29 | Discharge: 2023-01-29 | Disposition: A | Discharge status: Home / Self Care | Payer: BC Managed Care – PPO | Source: Ambulatory Visit | Attending: Cardiology | Admitting: Cardiology

## 2023-01-29 DIAGNOSIS — I5022 Chronic systolic (congestive) heart failure: Secondary | ICD-10-CM | POA: Insufficient documentation

## 2023-01-29 LAB — BASIC METABOLIC PANEL
Anion gap: 9 (ref 5–15)
BUN: 21 mg/dL (ref 8–23)
CO2: 25 mmol/L (ref 22–32)
Calcium: 9.9 mg/dL (ref 8.9–10.3)
Chloride: 102 mmol/L (ref 98–111)
Creatinine, Ser: 1.14 mg/dL — ABNORMAL HIGH (ref 0.44–1.00)
GFR, Estimated: 54 mL/min — ABNORMAL LOW (ref >60)
Glucose, Bld: 140 mg/dL — ABNORMAL HIGH (ref 70–99)
Potassium: 4.1 mmol/L (ref 3.5–5.1)
Sodium: 136 mmol/L (ref 135–145)

## 2023-01-30 ENCOUNTER — Ambulatory Visit: Payer: BC Managed Care – PPO | Attending: Cardiology

## 2023-01-30 DIAGNOSIS — Z95810 Presence of automatic (implantable) cardiac defibrillator: Secondary | ICD-10-CM

## 2023-01-30 DIAGNOSIS — I5022 Chronic systolic (congestive) heart failure: Secondary | ICD-10-CM

## 2023-01-30 NOTE — Progress Notes (Signed)
EPIC Encounter for ICM Monitoring  Patient Name: Brittany [name] is a 62 y.o. female Date: 01/30/2023 Primary Care Physican: Traci Sermon, PA-C Primary Cardiologist: Bensimhon Electrophysiologist: Quentin Ore 11/28/2022 Weight: 103 lbs 12/20/2022 Weight: 100 lbs 01/30/2023 Weight: 100-102 lbs   Spoke with patient and heart failure questions reviewed.  Transmission results reviewed.  Pt asymptomatic for fluid accumulation.  Pt is feeling good today.   Optivol Thoracic impedance suggesting fluid levels returned to normal.  Fluid index > normal threshold starting 11/29.  Barostim implant.   Prescribed: Torsemide 20 mg take 4 tablets (80 mg total) by mouth every morning and 2 tablets (40 mg total) every evening.  Potassium 10 mEq Take 4 tablet (40 mEq total) by mouth twice a day.  Take extra 40 mEq when you take metolazone Spironolactone 25 mg take 1 tablet (25 mg total) by mouth daily Farxiga 10 mg take 1 tablet daily   Labs: 01/29/2023 Creatinine 1.14, BUN 21, Potassium 4.1, Sodium 136, GFR 54 01/15/2023 Creatinine 1.37, BUN 19, Potassium 3.4, Sodium 135, GFR 44 12/19/2022 Creatinine 1.28, BUN 14, Potassium 3.4, Sodium 138, GFR 47 (resulting in an increase in K+ dosage) 12/12/2022 Creatinine 1.48, BUN 19, Potassium 2.9, Sodium 138, GFR 40  11/24/2022 Creatinine 1.16, BUN 18, Potassium 3.7, Sodium 138, GFR 53 11/23/2022 Creatinine 1.08, BUN 16, Potassium 4.0, Sodium 140, GFR 58  11/22/2022 Creatinine 0.88, BUN 19, Potassium 3.8, Sodium 140, GFR >60 11/21/2022 Creatinine 0.93, BUN 19, Potassium 3.7, Sodium 137, GFR >60  A complete set of results can be found in Results Review.   Recommendations:   Recommendation to limit salt intake.  Encouraged to call if experiencing any fluid symptoms.    Follow-up plan: ICM clinic phone appointment on 02/12/2023.   91 day device clinic remote transmission 03/21/2023.     EP/Cardiology Office Visits:  03/07/2023 with Dr [name].      Copy  of ICM check sent to Dr. Quentin Ore.   3 month ICM trend: 01/30/2023.    12-14 Month ICM trend:     [name], RN 01/30/2023 1:31 PM

## 2023-02-12 ENCOUNTER — Ambulatory Visit: Payer: BC Managed Care – PPO | Attending: Cardiology

## 2023-02-12 DIAGNOSIS — I5022 Chronic systolic (congestive) heart failure: Secondary | ICD-10-CM | POA: Diagnosis not present

## 2023-02-12 DIAGNOSIS — Z95810 Presence of automatic (implantable) cardiac defibrillator: Secondary | ICD-10-CM | POA: Diagnosis not present

## 2023-02-13 NOTE — Progress Notes (Signed)
EPIC Encounter for ICM Monitoring  Patient Name: Brittany [name] is a 62 y.o. female Date: 02/13/2023 Primary Care Physican: Traci Sermon, PA-C Primary Cardiologist: Bensimhon Electrophysiologist: Quentin Ore 11/28/2022 Weight: 103 lbs 12/20/2022 Weight: 100 lbs 02/13/2023 Weight: 100-102 lbs   Spoke with patient and heart failure questions reviewed.  Transmission results reviewed.  Pt asymptomatic for fluid accumulation.  Pt is feeling well today.  She is going to the beach.     Optivol Thoracic impedance suggesting fluid levels trending normal.   Barostim implant.   Prescribed: Torsemide 20 mg take 4 tablets (80 mg total) by mouth every morning and 2 tablets (40 mg total) every evening.  Potassium 10 mEq Take 4 tablet (40 mEq total) by mouth twice a day.  Take extra 40 mEq when you take metolazone Spironolactone 25 mg take 1 tablet (25 mg total) by mouth daily Farxiga 10 mg take 1 tablet daily   Labs: 01/29/2023 Creatinine 1.14, BUN 21, Potassium 4.1, Sodium 136, GFR 54 01/15/2023 Creatinine 1.37, BUN 19, Potassium 3.4, Sodium 135, GFR 44 12/19/2022 Creatinine 1.28, BUN 14, Potassium 3.4, Sodium 138, GFR 47 (resulting in an increase in K+ dosage) 12/12/2022 Creatinine 1.48, BUN 19, Potassium 2.9, Sodium 138, GFR 40  11/24/2022 Creatinine 1.16, BUN 18, Potassium 3.7, Sodium 138, GFR 53 11/23/2022 Creatinine 1.08, BUN 16, Potassium 4.0, Sodium 140, GFR 58  11/22/2022 Creatinine 0.88, BUN 19, Potassium 3.8, Sodium 140, GFR >60 11/21/2022 Creatinine 0.93, BUN 19, Potassium 3.7, Sodium 137, GFR >60  A complete set of results can be found in Results Review.   Recommendations:   Recommendation to limit salt intake.  Encouraged to call if experiencing any fluid symptoms.    Follow-up plan: ICM clinic phone appointment on 03/19/2023.   91 day device clinic remote transmission 03/21/2023.     EP/Cardiology Office Visits:  03/07/2023 with Dr [name].      Copy of ICM check sent to  Dr. Quentin Ore.   3 month ICM trend: 02/12/2023.    12-14 Month ICM trend:     [name], RN 02/13/2023 2:38 PM

## 2023-02-20 ENCOUNTER — Telehealth (HOSPITAL_COMMUNITY): Payer: Self-pay

## 2023-02-20 ENCOUNTER — Other Ambulatory Visit (HOSPITAL_COMMUNITY): Payer: Self-pay

## 2023-02-20 NOTE — Telephone Encounter (Signed)
Advanced Heart Failure Patient Advocate Encounter  Received notification from [employer] that prior authorization is required for [place]. Unable to process via CoverMyMeds, as a 'patient inactive' message is returning.  Called [employer] [phone]) in order to confirm active coverage, prior auth was submitted by phone. Case ID [id number].  Wilder Glade is approved on this plan from 01/21/23 to 02/20/24.  Test claim returns $15 copay for 90 days.  Clista Bernhardt, CPhT Rx Patient Advocate Phone: [phone]

## 2023-03-01 ENCOUNTER — Inpatient Hospital Stay (HOSPITAL_COMMUNITY)
Admission: EM | Admit: 2023-03-01 | Discharge: 2023-03-03 | DRG: 190 | Disposition: A | Discharge status: Home / Self Care | Payer: BC Managed Care – PPO | Attending: Internal Medicine | Admitting: Internal Medicine

## 2023-03-01 ENCOUNTER — Other Ambulatory Visit: Payer: Self-pay

## 2023-03-01 ENCOUNTER — Encounter (HOSPITAL_COMMUNITY): Payer: Self-pay

## 2023-03-01 ENCOUNTER — Emergency Department (HOSPITAL_COMMUNITY): Payer: BC Managed Care – PPO

## 2023-03-01 DIAGNOSIS — I5022 Chronic systolic (congestive) heart failure: Secondary | ICD-10-CM | POA: Diagnosis present

## 2023-03-01 DIAGNOSIS — I2510 Atherosclerotic heart disease of native coronary artery without angina pectoris: Secondary | ICD-10-CM | POA: Diagnosis not present

## 2023-03-01 DIAGNOSIS — J9611 Chronic respiratory failure with hypoxia: Secondary | ICD-10-CM | POA: Diagnosis present

## 2023-03-01 DIAGNOSIS — Z9851 Tubal ligation status: Secondary | ICD-10-CM

## 2023-03-01 DIAGNOSIS — R0602 Shortness of breath: Secondary | ICD-10-CM | POA: Diagnosis not present

## 2023-03-01 DIAGNOSIS — Z923 Personal history of irradiation: Secondary | ICD-10-CM

## 2023-03-01 DIAGNOSIS — Z8249 Family history of ischemic heart disease and other diseases of the circulatory system: Secondary | ICD-10-CM

## 2023-03-01 DIAGNOSIS — I509 Heart failure, unspecified: Secondary | ICD-10-CM

## 2023-03-01 DIAGNOSIS — J449 Chronic obstructive pulmonary disease, unspecified: Secondary | ICD-10-CM

## 2023-03-01 DIAGNOSIS — I480 Paroxysmal atrial fibrillation: Secondary | ICD-10-CM | POA: Diagnosis present

## 2023-03-01 DIAGNOSIS — J441 Chronic obstructive pulmonary disease with (acute) exacerbation: Secondary | ICD-10-CM | POA: Diagnosis not present

## 2023-03-01 DIAGNOSIS — E119 Type 2 diabetes mellitus without complications: Secondary | ICD-10-CM | POA: Diagnosis present

## 2023-03-01 DIAGNOSIS — I5043 Acute on chronic combined systolic (congestive) and diastolic (congestive) heart failure: Secondary | ICD-10-CM | POA: Diagnosis present

## 2023-03-01 DIAGNOSIS — Z79899 Other long term (current) drug therapy: Secondary | ICD-10-CM

## 2023-03-01 DIAGNOSIS — F419 Anxiety disorder, unspecified: Secondary | ICD-10-CM | POA: Diagnosis present

## 2023-03-01 DIAGNOSIS — Z95810 Presence of automatic (implantable) cardiac defibrillator: Secondary | ICD-10-CM

## 2023-03-01 DIAGNOSIS — Z1152 Encounter for screening for COVID-19: Secondary | ICD-10-CM

## 2023-03-01 DIAGNOSIS — Z885 Allergy status to narcotic agent status: Secondary | ICD-10-CM

## 2023-03-01 DIAGNOSIS — Z833 Family history of diabetes mellitus: Secondary | ICD-10-CM

## 2023-03-01 DIAGNOSIS — I110 Hypertensive heart disease with heart failure: Secondary | ICD-10-CM | POA: Diagnosis present

## 2023-03-01 DIAGNOSIS — I2489 Other forms of acute ischemic heart disease: Secondary | ICD-10-CM | POA: Diagnosis present

## 2023-03-01 DIAGNOSIS — Z85118 Personal history of other malignant neoplasm of bronchus and lung: Secondary | ICD-10-CM

## 2023-03-01 DIAGNOSIS — J9621 Acute and chronic respiratory failure with hypoxia: Secondary | ICD-10-CM | POA: Diagnosis present

## 2023-03-01 DIAGNOSIS — Z9221 Personal history of antineoplastic chemotherapy: Secondary | ICD-10-CM

## 2023-03-01 DIAGNOSIS — C3490 Malignant neoplasm of unspecified part of unspecified bronchus or lung: Secondary | ICD-10-CM | POA: Diagnosis present

## 2023-03-01 DIAGNOSIS — Z951 Presence of aortocoronary bypass graft: Secondary | ICD-10-CM

## 2023-03-01 DIAGNOSIS — I255 Ischemic cardiomyopathy: Secondary | ICD-10-CM | POA: Diagnosis present

## 2023-03-01 DIAGNOSIS — Z7901 Long term (current) use of anticoagulants: Secondary | ICD-10-CM

## 2023-03-01 DIAGNOSIS — Z87891 Personal history of nicotine dependence: Secondary | ICD-10-CM

## 2023-03-01 LAB — BASIC METABOLIC PANEL
Anion gap: 8 (ref 5–15)
BUN: 19 mg/dL (ref 8–23)
CO2: 27 mmol/L (ref 22–32)
Calcium: 9.6 mg/dL (ref 8.9–10.3)
Chloride: 101 mmol/L (ref 98–111)
Creatinine, Ser: 0.95 mg/dL (ref 0.44–1.00)
GFR, Estimated: >60 mL/min (ref >60)
Glucose, Bld: 127 mg/dL — ABNORMAL HIGH (ref 70–99)
Potassium: 4.4 mmol/L (ref 3.5–5.1)
Sodium: 136 mmol/L (ref 135–145)

## 2023-03-01 LAB — HEPATIC FUNCTION PANEL
ALT: 19 U/L (ref 0–44)
ALT: 18 U/L (ref 0–44)
AST: 37 U/L (ref 15–41)
AST: 26 U/L (ref 15–41)
Albumin: 3.6 g/dL (ref 3.5–5.0)
Albumin: 3.8 g/dL (ref 3.5–5.0)
Alkaline Phosphatase: 85 U/L (ref 38–126)
Alkaline Phosphatase: 88 U/L (ref 38–126)
Bilirubin, Direct: 0.3 mg/dL — ABNORMAL HIGH (ref 0.0–0.2)
Bilirubin, Direct: 0.1 mg/dL (ref 0.0–0.2)
Indirect Bilirubin: 0.7 mg/dL (ref 0.3–0.9)
Indirect Bilirubin: 0.7 mg/dL (ref 0.3–0.9)
Total Bilirubin: 1 mg/dL (ref 0.3–1.2)
Total Bilirubin: 0.8 mg/dL (ref 0.3–1.2)
Total Protein: 7.4 g/dL (ref 6.5–8.1)
Total Protein: 7.9 g/dL (ref 6.5–8.1)

## 2023-03-01 LAB — DIGOXIN LEVEL: Digoxin Level: <0.2 ng/mL — ABNORMAL LOW (ref 0.8–2.0)

## 2023-03-01 LAB — TROPONIN I (HIGH SENSITIVITY)
Troponin I (High Sensitivity): 28 ng/L — ABNORMAL HIGH (ref <18)
Troponin I (High Sensitivity): 26 ng/L — ABNORMAL HIGH (ref <18)

## 2023-03-01 LAB — CBC
HCT: 45.9 % (ref 36.0–46.0)
Hemoglobin: 14.4 g/dL (ref 12.0–15.0)
MCH: 28.5 pg (ref 26.0–34.0)
MCHC: 31.4 g/dL (ref 30.0–36.0)
MCV: 90.7 fL (ref 80.0–100.0)
Platelets: 228 10*3/uL (ref 150–400)
RBC: 5.06 MIL/uL (ref 3.87–5.11)
RDW: 17.9 % — ABNORMAL HIGH (ref 11.5–15.5)
WBC: 7.4 10*3/uL (ref 4.0–10.5)
nRBC: 0 % (ref 0.0–0.2)

## 2023-03-01 LAB — BRAIN NATRIURETIC PEPTIDE: B Natriuretic Peptide: 1397.9 pg/mL — ABNORMAL HIGH (ref 0.0–100.0)

## 2023-03-01 LAB — RESP PANEL BY RT-PCR (RSV, FLU A&B, COVID)  RVPGX2
Influenza A by PCR: NEGATIVE
Influenza B by PCR: NEGATIVE
Resp Syncytial Virus by PCR: NEGATIVE
SARS Coronavirus 2 by RT PCR: NEGATIVE

## 2023-03-01 LAB — MAGNESIUM
Magnesium: 2.2 mg/dL (ref 1.7–2.4)
Magnesium: 2.1 mg/dL (ref 1.7–2.4)

## 2023-03-01 LAB — LIPASE, BLOOD
Lipase: 28 U/L (ref 11–51)
Lipase: 29 U/L (ref 11–51)

## 2023-03-01 MED ORDER — ACETAMINOPHEN 650 MG RE SUPP: 650.0000 mg | Freq: Four times a day (QID) | RECTAL | Status: DC | PRN

## 2023-03-01 MED ORDER — DIGOXIN 125 MCG PO TABS
0.1250 mg | ORAL_TABLET | Freq: Every day | ORAL | Status: DC
  Administered 2023-03-02 – 2023-03-03 (×2): 0.125 mg via ORAL
  Filled 2023-03-01 (×2): qty 1

## 2023-03-01 MED ORDER — SENNOSIDES-DOCUSATE SODIUM 8.6-50 MG PO TABS: 1.0000 | ORAL_TABLET | Freq: Every evening | ORAL | Status: DC | PRN

## 2023-03-01 MED ORDER — IPRATROPIUM BROMIDE 0.02 % IN SOLN
0.5000 mg | Freq: Once | RESPIRATORY_TRACT | Status: AC
  Administered 2023-03-01: 0.5 mg via RESPIRATORY_TRACT
  Filled 2023-03-01: qty 2.5

## 2023-03-01 MED ORDER — IVABRADINE HCL 5 MG PO TABS
7.5000 mg | ORAL_TABLET | Freq: Two times a day (BID) | ORAL | Status: DC
  Administered 2023-03-01 – 2023-03-03 (×4): 7.5 mg via ORAL
  Filled 2023-03-01 (×6): qty 1

## 2023-03-01 MED ORDER — ALBUTEROL SULFATE (2.5 MG/3ML) 0.083% IN NEBU
2.5000 mg | INHALATION_SOLUTION | RESPIRATORY_TRACT | Status: DC | PRN
  Administered 2023-03-02: 2.5 mg via RESPIRATORY_TRACT
  Filled 2023-03-01 (×2): qty 3

## 2023-03-01 MED ORDER — ACETAMINOPHEN 325 MG PO TABS: 650.0000 mg | ORAL_TABLET | Freq: Four times a day (QID) | ORAL | Status: DC | PRN

## 2023-03-01 MED ORDER — IVABRADINE HCL 5 MG PO TABS
7.5000 mg | ORAL_TABLET | Freq: Two times a day (BID) | ORAL | Status: DC
  Filled 2023-03-01: qty 1

## 2023-03-01 MED ORDER — ALBUTEROL SULFATE (2.5 MG/3ML) 0.083% IN NEBU
5.0000 mg | INHALATION_SOLUTION | Freq: Once | RESPIRATORY_TRACT | Status: AC
  Administered 2023-03-01: 5 mg via RESPIRATORY_TRACT
  Filled 2023-03-01: qty 6

## 2023-03-01 MED ORDER — PREDNISONE 20 MG PO TABS
40.0000 mg | ORAL_TABLET | Freq: Every day | ORAL | Status: DC
  Administered 2023-03-03: 40 mg via ORAL
  Filled 2023-03-01: qty 2

## 2023-03-01 MED ORDER — SODIUM CHLORIDE 0.9% FLUSH
3.0000 mL | Freq: Two times a day (BID) | INTRAVENOUS | Status: DC
  Administered 2023-03-01 – 2023-03-02 (×3): 3 mL via INTRAVENOUS

## 2023-03-01 MED ORDER — METHYLPREDNISOLONE SODIUM SUCC 125 MG IJ SOLR
125.0000 mg | Freq: Two times a day (BID) | INTRAMUSCULAR | Status: AC
  Administered 2023-03-01 – 2023-03-02 (×2): 125 mg via INTRAVENOUS
  Filled 2023-03-01 (×2): qty 2

## 2023-03-01 MED ORDER — IPRATROPIUM-ALBUTEROL 0.5-2.5 (3) MG/3ML IN SOLN
3.0000 mL | Freq: Four times a day (QID) | RESPIRATORY_TRACT | Status: DC
  Administered 2023-03-01 – 2023-03-03 (×5): 3 mL via RESPIRATORY_TRACT
  Filled 2023-03-01 (×5): qty 3

## 2023-03-01 MED ORDER — SODIUM CHLORIDE 0.9 % IV SOLN
1.0000 g | INTRAVENOUS | Status: DC
  Administered 2023-03-01 – 2023-03-02 (×2): 1 g via INTRAVENOUS
  Filled 2023-03-01 (×2): qty 10

## 2023-03-01 MED ORDER — TORSEMIDE 20 MG PO TABS
80.0000 mg | ORAL_TABLET | Freq: Every day | ORAL | Status: DC
  Administered 2023-03-01 – 2023-03-02 (×2): 80 mg via ORAL
  Filled 2023-03-01 (×2): qty 4

## 2023-03-01 MED ORDER — ATORVASTATIN CALCIUM 80 MG PO TABS
80.0000 mg | ORAL_TABLET | Freq: Every day | ORAL | Status: DC
  Administered 2023-03-02: 80 mg via ORAL
  Filled 2023-03-01: qty 1

## 2023-03-01 MED ORDER — APIXABAN 5 MG PO TABS
5.0000 mg | ORAL_TABLET | Freq: Two times a day (BID) | ORAL | Status: DC
  Administered 2023-03-01 – 2023-03-03 (×4): 5 mg via ORAL
  Filled 2023-03-01 (×4): qty 1

## 2023-03-01 MED ORDER — TORSEMIDE 20 MG PO TABS: 20.0000 mg | ORAL_TABLET | ORAL | Status: DC

## 2023-03-01 MED ORDER — OXYCODONE HCL 5 MG PO TABS: 5.0000 mg | ORAL_TABLET | ORAL | Status: DC | PRN

## 2023-03-01 NOTE — ED Notes (Signed)
ED TO INPATIENT HANDOFF REPORT  ED Nurse Name and Phone #: Inda Merlin [phone]  S Name/Age/Gender Brittany [name] 62 y.o. female Room/Bed: 003C/003C  Code Status   Code Status: Full Code  Home/SNF/Other Home Patient oriented to: self, place, time, and situation Is this baseline? Yes   Triage Complete: Triage complete  Chief Complaint COPD with acute exacerbation ([place]) [J44.1]  Triage Note Pt arrived via [facility] from home for increased SOBx3-4d. Pt wears 2L 02 per [place] at baseline, but has had to increase 02 to 4L  today. Per EMS, pt has rales sounds in lungs. Pt has a defib/pacemaker and it's medtronic. Pt has biostem on right side of body, EMS was unable to get a clear EKG due to that.   Allergies Allergies  Allergen Reactions   Codeine Nausea And Vomiting    Level of Care/Admitting Diagnosis ED Disposition     ED Disposition  Admit   Condition  --   [place]: [place] [[id number]]  Level of Care: Telemetry Cardiac [103]  May place patient in observation at [facility] or [place] if equivalent level of care is available:: Yes  Covid Evaluation: Confirmed COVID Negative  Diagnosis: COPD with acute exacerbation [facility]) [id number]  Admitting Physician: Vianne Bulls [id number]  Attending Physician: Vianne Bulls [id number]          B Medical/Surgery History Past Medical History:  Diagnosis Date   Acute respiratory failure ([place]) [id number]   Acute systolic congestive heart failure ([place]) 02/03/2018   AICD (automatic cardioverter/defibrillator) present    Allergy    Anxiety    Asthma    DM2 (diabetes mellitus, type 2) ([place]) 10/20/2020   Hypertension    PAF (paroxysmal atrial fibrillation) ([place])    Presence of permanent cardiac pacemaker    Prophylactic measure 08/03/14-08/19/14   Prophyl. 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   LIMA to LAD, SVG to D1, SVG to OM1, EVH via right thigh with implantation  of [place] LD LVAD via direct aortic approach   Small cell lung cancer ([place]) 03/16/2014   Past Surgical History:  Procedure Laterality Date   BRONCHIAL BRUSHINGS  10/25/2020   Procedure: BRONCHIAL BRUSHINGS;  Surgeon: Collene Gobble, MD;  Location: [facility] ENDOSCOPY;  Service: Pulmonary;;   BRONCHIAL NEEDLE ASPIRATION BIOPSY  10/25/2020   Procedure: BRONCHIAL NEEDLE ASPIRATION BIOPSIES;  Surgeon: Collene Gobble, MD;  Location: [facility] ENDOSCOPY;  Service: Pulmonary;;   CARDIAC DEFIBRILLATOR PLACEMENT  08/15/2018   MDT Visia AF MRI VR ICD implanted by Dr Loel Lofty for primary prevention of sudden   CESAREAN SECTION     CORONARY ARTERY BYPASS GRAFT N/A 02/03/2018   Procedure: CORONARY ARTERY BYPASS GRAFTING (CABG);  Surgeon: Rexene Alberts, MD;  Location: [place];  Service: Open Heart Surgery;  Laterality: N/A;  Time 3 using left internal mammary artery and endoscopically harvested right saphenous vein   CORONARY BALLOON ANGIOPLASTY N/A 02/03/2018   Procedure: CORONARY BALLOON ANGIOPLASTY;  Surgeon: Martinique, Peter M, MD;  Location: [place] CV LAB;  Service: Cardiovascular;  Laterality: N/A;   CORONARY STENT INTERVENTION N/A 02/12/2019   Procedure: CORONARY STENT INTERVENTION;  Surgeon: Wellington Hampshire, MD;  Location: [place] CV LAB;  Service: Cardiovascular;  Laterality: N/A;   CORONARY/GRAFT ACUTE MI REVASCULARIZATION N/A 02/03/2018   Procedure: Coronary/Graft Acute MI Revascularization;  Surgeon: Martinique, Peter M, MD;  Location: [place] CV LAB;  Service: Cardiovascular;  Laterality: N/A;  ENDOBRONCHIAL ULTRASOUND N/A 10/25/2020   Procedure: ENDOBRONCHIAL ULTRASOUND;  Surgeon: Collene Gobble, MD;  Location: [facility] ENDOSCOPY;  Service: Pulmonary;  Laterality: N/A;   FLEXIBLE BRONCHOSCOPY  10/25/2020   Procedure: FLEXIBLE BRONCHOSCOPY;  Surgeon: Collene Gobble, MD;  Location: [facility] ENDOSCOPY;  Service: Pulmonary;;   IABP INSERTION N/A 02/03/2018   Procedure: IABP Insertion;  Surgeon: Martinique, Peter M,  MD;  Location: [place] CV LAB;  Service: Cardiovascular;  Laterality: N/A;   INTRAOPERATIVE TRANSESOPHAGEAL ECHOCARDIOGRAM N/A 02/03/2018   Procedure: INTRAOPERATIVE TRANSESOPHAGEAL ECHOCARDIOGRAM;  Surgeon: Rexene Alberts, MD;  Location: [place];  Service: Open Heart Surgery;  Laterality: N/A;   LEFT HEART CATH AND CORONARY ANGIOGRAPHY N/A 02/03/2018   Procedure: LEFT HEART CATH AND CORONARY ANGIOGRAPHY;  Surgeon: Martinique, Peter M, MD;  Location: [place] CV LAB;  Service: Cardiovascular;  Laterality: N/A;   LEFT HEART CATH AND CORS/GRAFTS ANGIOGRAPHY N/A 02/12/2019   Procedure: LEFT HEART CATH AND CORS/GRAFTS ANGIOGRAPHY;  Surgeon: Wellington Hampshire, MD;  Location: [place] CV LAB;  Service: Cardiovascular;  Laterality: N/A;   MEDIASTINOSCOPY N/A 03/11/2014   Procedure: MEDIASTINOSCOPY;  Surgeon: Melrose Nakayama, MD;  Location: [place];  Service: Thoracic;  Laterality: N/A;   PLACEMENT OF IMPELLA LEFT VENTRICULAR ASSIST DEVICE  02/03/2018   Procedure: PLACEMENT OF [place] LEFT VENTRICULAR ASSIST DEVICE LD;  Surgeon: Rexene Alberts, MD;  Location: [place];  Service: Open Heart Surgery;;   REMOVAL OF [place] LEFT VENTRICULAR ASSIST DEVICE N/A 02/08/2018   Procedure: REMOVAL OF [place] LEFT VENTRICULAR ASSIST DEVICE;  Surgeon: Rexene Alberts, MD;  Location: [place];  Service: Open Heart Surgery;  Laterality: N/A;   RIGHT HEART CATH N/A 02/03/2018   Procedure: RIGHT HEART CATH;  Surgeon: Martinique, Peter M, MD;  Location: [place] CV LAB;  Service: Cardiovascular;  Laterality: N/A;   RIGHT HEART CATH N/A 05/09/2018   Procedure: RIGHT HEART CATH;  Surgeon: Jolaine Artist, MD;  Location: [place] CV LAB;  Service: Cardiovascular;  Laterality: N/A;   TEE WITHOUT CARDIOVERSION N/A 02/08/2018   Procedure: TRANSESOPHAGEAL ECHOCARDIOGRAM (TEE);  Surgeon: Rexene Alberts, MD;  Location: [place];  Service: Open Heart Surgery;  Laterality: N/A;   TUBAL LIGATION     VIDEO BRONCHOSCOPY WITH ENDOBRONCHIAL  ULTRASOUND N/A 03/11/2014   Procedure: VIDEO BRONCHOSCOPY WITH ENDOBRONCHIAL ULTRASOUND;  Surgeon: Melrose Nakayama, MD;  Location: [place];  Service: Thoracic;  Laterality: N/A;     A IV Location/Drains/Wounds Patient Lines/Drains/Airways Status     Active Line/Drains/Airways     Name Placement date Placement time Site Days   Peripheral IV 03/01/23 20 G Right Antecubital 03/01/23  1621  Antecubital  less than 1            Intake/Output Last 24 hours No intake or output data in the 24 hours ending 03/01/23 2103  Labs/Imaging Results for orders placed or performed during the hospital encounter of 03/01/23 (from the past 48 hour(s))  Basic metabolic panel     Status: Abnormal   Collection Time: 03/01/23  4:20 PM  Result Value Ref Range   Sodium 136 135 - 145 mmol/L   Potassium 4.4 3.5 - 5.1 mmol/L    Comment: HEMOLYSIS AT THIS LEVEL MAY AFFECT RESULT   Chloride 101 98 - 111 mmol/L   CO2 27 22 - 32 mmol/L   Glucose, Bld 127 (H) 70 - 99 mg/dL    Comment: Glucose reference range applies only to samples taken after fasting for  at least 8 hours.   BUN 19 8 - 23 mg/dL   Creatinine, Ser 0.95 0.44 - 1.00 mg/dL   Calcium 9.6 8.9 - 10.3 mg/dL   GFR, Estimated >60 >60 mL/min    Comment: (NOTE) Calculated using the CKD-EPI Creatinine Equation (2021)    Anion gap 8 5 - 15    Comment: Performed at [facility] [address] [place], [place] [address]  CBC     Status: Abnormal   Collection Time: 03/01/23  4:20 PM  Result Value Ref Range   WBC 7.4 4.0 - 10.5 K/uL   RBC 5.06 3.87 - 5.11 MIL/uL   Hemoglobin 14.4 12.0 - 15.0 g/dL   HCT 45.9 36.0 - 46.0 %   MCV 90.7 80.0 - 100.0 fL   MCH 28.5 26.0 - 34.0 pg   MCHC 31.4 30.0 - 36.0 g/dL   RDW 17.9 (H) 11.5 - 15.5 %   Platelets 228 150 - 400 K/uL   nRBC 0.0 0.0 - 0.2 %    Comment: Performed at [facility], [place] [address] [place], [place] [address]  Troponin I (High Sensitivity)     Status: Abnormal   Collection  Time: 03/01/23  4:20 PM  Result Value Ref Range   Troponin I (High Sensitivity) 28 (H) <18 ng/L    Comment: (NOTE) Elevated high sensitivity troponin I (hsTnI) values and significant  changes across serial measurements may suggest ACS but many other  chronic and acute conditions are known to elevate hsTnI results.  Refer to the "Links" section for chest pain algorithms and additional  guidance. Performed at [facility], [place] [address], [place], [place] [address]   Hepatic function panel     Status: Abnormal   Collection Time: 03/01/23  4:20 PM  Result Value Ref Range   Total Protein 7.4 6.5 - 8.1 g/dL   Albumin 3.6 3.5 - 5.0 g/dL   AST 37 15 - 41 U/L    Comment: HEMOLYSIS AT THIS LEVEL MAY AFFECT RESULT   ALT 19 0 - 44 U/L    Comment: HEMOLYSIS AT THIS LEVEL MAY AFFECT RESULT   Alkaline Phosphatase 85 38 - 126 U/L   Total Bilirubin 1.0 0.3 - 1.2 mg/dL    Comment: HEMOLYSIS AT THIS LEVEL MAY AFFECT RESULT   Bilirubin, Direct 0.3 (H) 0.0 - 0.2 mg/dL   Indirect Bilirubin 0.7 0.3 - 0.9 mg/dL    Comment: Performed at [facility] [address], [place], [place] [address]  Lipase, blood     Status: None   Collection Time: 03/01/23  4:20 PM  Result Value Ref Range   Lipase 28 11 - 51 U/L    Comment: Performed at [facility] [address] [place], [place] [address]  Magnesium     Status: None   Collection Time: 03/01/23  4:20 PM  Result Value Ref Range   Magnesium 2.2 1.7 - 2.4 mg/dL    Comment: Performed at [facility] [address] [place], [place] [address]  Brain natriuretic peptide     Status: Abnormal   Collection Time: 03/01/23  4:35 PM  Result Value Ref Range   B Natriuretic Peptide 1,397.9 (H) 0.0 - 100.0 pg/mL    Comment: Performed at [facility] [address] [place], [place] [address]  Lipase, blood     Status: None   Collection Time: 03/01/23  5:50 PM  Result Value Ref  Range   Lipase 29 11 - 51 U/L    Comment: Performed  at [facility] [address] [place], [place] [address]  Hepatic function panel     Status: None   Collection Time: 03/01/23  5:50 PM  Result Value Ref Range   Total Protein 7.9 6.5 - 8.1 g/dL   Albumin 3.8 3.5 - 5.0 g/dL   AST 26 15 - 41 U/L   ALT 18 0 - 44 U/L   Alkaline Phosphatase 88 38 - 126 U/L   Total Bilirubin 0.8 0.3 - 1.2 mg/dL   Bilirubin, Direct 0.1 0.0 - 0.2 mg/dL   Indirect Bilirubin 0.7 0.3 - 0.9 mg/dL    Comment: Performed at [facility] [address], [place], [place] [address]  Magnesium     Status: None   Collection Time: 03/01/23  5:50 PM  Result Value Ref Range   Magnesium 2.1 1.7 - 2.4 mg/dL    Comment: Performed at [facility] [address] [place], [place] [address]  Resp panel by RT-PCR (RSV, Flu A&B, Covid) Anterior Nasal Swab     Status: None   Collection Time: 03/01/23  5:50 PM   Specimen: Anterior Nasal Swab  Result Value Ref Range   SARS Coronavirus 2 by RT PCR NEGATIVE NEGATIVE   Influenza A by PCR NEGATIVE NEGATIVE   Influenza B by PCR NEGATIVE NEGATIVE    Comment: (NOTE) The Xpert Xpress SARS-CoV-2/FLU/RSV plus assay is intended as an aid in the diagnosis of influenza from Nasopharyngeal swab specimens and should not be used as a sole basis for treatment. Nasal washings and aspirates are unacceptable for Xpert Xpress SARS-CoV-2/FLU/RSV testing.  Fact Sheet for Patients: EntrepreneurPulse.com.au  Fact Sheet for Healthcare Providers: IncredibleEmployment.be  This test is not yet approved or cleared by the [place] FDA and has been authorized for detection and/or diagnosis of SARS-CoV-2 by FDA under an Emergency Use Authorization (EUA). This EUA will remain in effect (meaning this test can be used) for the duration of the COVID-19 declaration under Section 564(b)(1) of the Act, 21 U.S.C. section 360bbb-3(b)(1), unless the authorization is terminated or revoked.     Resp  Syncytial Virus by PCR NEGATIVE NEGATIVE    Comment: (NOTE) Fact Sheet for Patients: EntrepreneurPulse.com.au  Fact Sheet for Healthcare Providers: IncredibleEmployment.be  This test is not yet approved or cleared by the [place] FDA and has been authorized for detection and/or diagnosis of SARS-CoV-2 by FDA under an Emergency Use Authorization (EUA). This EUA will remain in effect (meaning this test can be used) for the duration of the COVID-19 declaration under Section 564(b)(1) of the Act, 21 U.S.C. section 360bbb-3(b)(1), unless the authorization is terminated or revoked.  Performed at [facility], [place] [address] [place], [place] [address]    DG Chest Portable 1 View  Result Date: 03/01/2023 CLINICAL DATA:  62 year old female for since for evaluation of shortness of breath. EXAM: PORTABLE CHEST 1 VIEW COMPARISON:  November 21, 2022 FINDINGS: LEFT-sided single lead pacer defibrillator remains in place. Nerve stimulator over the RIGHT chest, unchanged. EKG leads project over the chest. Median sternotomy changes for CABG with stable cardiomediastinal contours and hilar structures. Cardiomegaly as before. Lungs are clear without signs of effusion or pneumothorax. On limited assessment there is no acute skeletal process. IMPRESSION: 1. No acute cardiopulmonary disease. 2. Median sternotomy changes for CABG with cardiomegaly and central pulmonary vascular congestion. 3. Obscured RIGHT heart border as seen  on previous imaging related to perihilar radiation changes about the RIGHT hilum. Electronically Signed   By: Zetta Bills M.D.   On: 03/01/2023 18:17    Pending Labs Unresulted Labs (From admission, onward)     Start     Ordered   03/02/23 [id number]  Basic metabolic panel  Daily,   R      03/01/23 2052   03/02/23 0500  CBC  Tomorrow morning,   R        03/01/23 2052   03/01/23 2053  Expectorated Sputum Assessment w Gram Stain, Rflx to Resp  Cult  Once,   R        03/01/23 2052   03/01/23 1635  Digoxin level  Once,   STAT        03/01/23 1634            Vitals/Pain Today's Vitals   03/01/23 1900 03/01/23 1915 03/01/23 1935 03/01/23 2030  BP: 96/85   92/75  Pulse: (!) 101 95  96  Resp: (!) 27 17  19   Temp:   98 F (36.7 C)   TempSrc:   Oral   SpO2: 100% 100%  100%  Weight:      Height:      PainSc:        Isolation Precautions No active isolations  Medications Medications  atorvastatin (LIPITOR) tablet 80 mg (has no administration in time range)  digoxin (LANOXIN) tablet 0.125 mg (has no administration in time range)  ivabradine (CORLANOR) tablet 7.5 mg (has no administration in time range)  torsemide (DEMADEX) tablet 20 mg (has no administration in time range)  apixaban (ELIQUIS) tablet 5 mg (has no administration in time range)  cefTRIAXone (ROCEPHIN) 1 g in sodium chloride 0.9 % 100 mL IVPB (has no administration in time range)  methylPREDNISolone sodium succinate (SOLU-MEDROL) 125 mg/2 mL injection 125 mg (has no administration in time range)    Followed by  predniSONE (DELTASONE) tablet 40 mg (has no administration in time range)  ipratropium-albuterol (DUONEB) 0.5-2.5 (3) MG/3ML nebulizer solution 3 mL (has no administration in time range)  albuterol (PROVENTIL) (2.5 MG/3ML) 0.083% nebulizer solution 2.5 mg (has no administration in time range)  sodium chloride flush (NS) 0.9 % injection 3 mL (has no administration in time range)  acetaminophen (TYLENOL) tablet 650 mg (has no administration in time range)    Or  acetaminophen (TYLENOL) suppository 650 mg (has no administration in time range)  oxyCODONE (Oxy IR/ROXICODONE) immediate release tablet 5 mg (has no administration in time range)  senna-docusate (Senokot-S) tablet 1 tablet (has no administration in time range)  albuterol (PROVENTIL) (2.5 MG/3ML) 0.083% nebulizer solution 5 mg (5 mg Nebulization Given 03/01/23 1758)  ipratropium (ATROVENT)  nebulizer solution 0.5 mg (0.5 mg Nebulization Given 03/01/23 1758)    Mobility walks with person assist     Focused Assessments Pulmonary Assessment Handoff:  Lung sounds:   O2 Device: Nasal Cannula O2 Flow Rate (L/min): 4 L/min    R Recommendations: See Admitting Provider Note  Report given to:   Additional Notes: Kind lady here for shortness of breath

## 2023-03-01 NOTE — ED Provider Notes (Signed)
[facility] Provider Note   CSN: [id number] Arrival date & time: 03/01/23  1547     History  Chief Complaint  Patient presents with   Shortness of Breath    Brittany [name] is a 62 y.o. female.   Shortness of Breath Patient resents with shortness of breath.  Has had for last 2 days.  Occasional cough.  States her weight is actually down.  States weight of 97 at home.  Baseline weight is around 101.  History of CHF and COPD.  Has oxygen to use at home as needed.  No swelling in her legs.  No fevers.  However has had some nausea and vomiting and a little bit of diarrhea.  States she feels more short of breath when she lays flat.    Past Medical History:  Diagnosis Date   Acute respiratory failure ([place]) [id number]   Acute systolic congestive heart failure ([place]) 02/03/2018   AICD (automatic cardioverter/defibrillator) present    Allergy    Anxiety    Asthma    DM2 (diabetes mellitus, type 2) ([place]) 10/20/2020   Hypertension    PAF (paroxysmal atrial fibrillation) ([place])    Presence of permanent cardiac pacemaker    Prophylactic measure 08/03/14-08/19/14   Prophyl. 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   LIMA to LAD, SVG to D1, SVG to OM1, EVH via right thigh with implantation of Impella LD LVAD via direct aortic approach   Small cell lung cancer ([place]) 03/16/2014   Past Surgical History:  Procedure Laterality Date   BRONCHIAL BRUSHINGS  10/25/2020   Procedure: BRONCHIAL BRUSHINGS;  Surgeon: Collene Gobble, MD;  Location: [facility] ENDOSCOPY;  Service: Pulmonary;;   BRONCHIAL NEEDLE ASPIRATION BIOPSY  10/25/2020   Procedure: BRONCHIAL NEEDLE ASPIRATION BIOPSIES;  Surgeon: Collene Gobble, MD;  Location: [facility] ENDOSCOPY;  Service: Pulmonary;;   CARDIAC DEFIBRILLATOR PLACEMENT  08/15/2018   MDT Visia AF MRI VR ICD implanted by Dr Loel Lofty for primary prevention of sudden   CESAREAN SECTION     CORONARY ARTERY BYPASS  GRAFT N/A 02/03/2018   Procedure: CORONARY ARTERY BYPASS GRAFTING (CABG);  Surgeon: Rexene Alberts, MD;  Location: [facility];  Service: Open Heart Surgery;  Laterality: N/A;  Time 3 using left internal mammary artery and endoscopically harvested right saphenous vein   CORONARY BALLOON ANGIOPLASTY N/A 02/03/2018   Procedure: CORONARY BALLOON ANGIOPLASTY;  Surgeon: Martinique, Peter M, MD;  Location: [place] CV LAB;  Service: Cardiovascular;  Laterality: N/A;   CORONARY STENT INTERVENTION N/A 02/12/2019   Procedure: CORONARY STENT INTERVENTION;  Surgeon: Wellington Hampshire, MD;  Location: [place] CV LAB;  Service: Cardiovascular;  Laterality: N/A;   CORONARY/GRAFT ACUTE MI REVASCULARIZATION N/A 02/03/2018   Procedure: Coronary/Graft Acute MI Revascularization;  Surgeon: Martinique, Peter M, MD;  Location: [place] CV LAB;  Service: Cardiovascular;  Laterality: N/A;   ENDOBRONCHIAL ULTRASOUND N/A 10/25/2020   Procedure: ENDOBRONCHIAL ULTRASOUND;  Surgeon: Collene Gobble, MD;  Location[facility] ENDOSCOPY;  Service: Pulmonary;  Laterality: N/A;   FLEXIBLE BRONCHOSCOPY  10/25/2020   Procedure: FLEXIBLE BRONCHOSCOPY;  Surgeon: Collene Gobble, MD;  Location[facility] ENDOSCOPY;  Service: Pulmonary;;   IABP INSERTION N/A 02/03/2018   Procedure: IABP Insertion;  Surgeon: Martinique, Peter M, MD;  Location: [place] CV LAB;  Service: Cardiovascular;  Laterality: N/A;   INTRAOPERATIVE TRANSESOPHAGEAL ECHOCARDIOGRAM N/A 02/03/2018   Procedure: INTRAOPERATIVE TRANSESOPHAGEAL ECHOCARDIOGRAM;  Surgeon: Rexene Alberts, MD;  Location: [facility] OR;  Service: Open Heart Surgery;  Laterality: N/A;   LEFT HEART CATH AND CORONARY ANGIOGRAPHY N/A 02/03/2018   Procedure: LEFT HEART CATH AND CORONARY ANGIOGRAPHY;  Surgeon: Martinique, Peter M, MD;  Location: [place] CV LAB;  Service: Cardiovascular;  Laterality: N/A;   LEFT HEART CATH AND CORS/GRAFTS ANGIOGRAPHY N/A 02/12/2019   Procedure: LEFT HEART CATH AND CORS/GRAFTS ANGIOGRAPHY;  Surgeon:  Wellington Hampshire, MD;  Location: [place] CV LAB;  Service: Cardiovascular;  Laterality: N/A;   MEDIASTINOSCOPY N/A 03/11/2014   Procedure: MEDIASTINOSCOPY;  Surgeon: [place] Nakayama, MD;  Location: [place];  Service: Thoracic;  Laterality: N/A;   PLACEMENT OF IMPELLA LEFT VENTRICULAR ASSIST DEVICE  02/03/2018   Procedure: PLACEMENT OF [place] LEFT VENTRICULAR ASSIST DEVICE LD;  Surgeon: Rexene Alberts, MD;  Location: [place];  Service: Open Heart Surgery;;   REMOVAL OF [place] LEFT VENTRICULAR ASSIST DEVICE N/A 02/08/2018   Procedure: REMOVAL OF [place] LEFT VENTRICULAR ASSIST DEVICE;  Surgeon: Rexene Alberts, MD;  Location: [place];  Service: Open Heart Surgery;  Laterality: N/A;   RIGHT HEART CATH N/A 02/03/2018   Procedure: RIGHT HEART CATH;  Surgeon: Martinique, Peter M, MD;  Location: [place] CV LAB;  Service: Cardiovascular;  Laterality: N/A;   RIGHT HEART CATH N/A 05/09/2018   Procedure: RIGHT HEART CATH;  Surgeon: Jolaine Artist, MD;  Location: [place] CV LAB;  Service: Cardiovascular;  Laterality: N/A;   TEE WITHOUT CARDIOVERSION N/A 02/08/2018   Procedure: TRANSESOPHAGEAL ECHOCARDIOGRAM (TEE);  Surgeon: Rexene Alberts, MD;  Location: [place];  Service: Open Heart Surgery;  Laterality: N/A;   TUBAL LIGATION     VIDEO BRONCHOSCOPY WITH ENDOBRONCHIAL ULTRASOUND N/A 03/11/2014   Procedure: VIDEO BRONCHOSCOPY WITH ENDOBRONCHIAL ULTRASOUND;  Surgeon: [place] Nakayama, MD;  Location: [place];  Service: Thoracic;  Laterality: N/A;     Home Medications Prior to Admission medications   Medication Sig Start Date End Date Taking? Authorizing Provider  albuterol (VENTOLIN HFA) 108 (90 Base) MCG/ACT inhaler Inhale 2 puffs into the lungs every 6 (six) hours as needed for wheezing or shortness of breath. 06/20/22   Domenic Polite, MD  apixaban (ELIQUIS) 5 MG TABS tablet Take 1 tablet (5 mg total) by mouth 2 (two) times daily. 11/24/22   Hongalgi, Lenis Dickinson, MD  atorvastatin (LIPITOR) 80 MG  tablet Take 1 tablet (80 mg total) by mouth daily at 6 PM. 04/16/22   Hongalgi, Lenis Dickinson, MD  blood glucose meter kit and supplies KIT Dispense based on patient and insurance preference. Use up to four times daily as directed. 04/11/21   Bonnielee Haff, MD  dapagliflozin propanediol (FARXIGA) 10 MG TABS tablet Take 1 tablet by mouth daily. 09/04/22   Bensimhon, Shaune Pascal, MD  digoxin (LANOXIN) 0.125 MG tablet Take 1 tablet (0.125 mg total) by mouth daily. 07/13/22   Rosita Fire, Brittainy M, PA-C  Glucerna (GLUCERNA) LIQD Take 237 mLs by mouth See admin instructions. Takes 1 bottle twice a week    [provider]  ipratropium-albuterol (DUONEB) 0.5-2.5 (3) MG/3ML SOLN Take 3 mLs by nebulization every 4 (four) hours as needed (for shortness of breath). 07/12/22   Larey Dresser, MD  ivabradine (CORLANOR) 7.5 MG TABS tablet Take 1 tablet (7.5 mg total) by mouth 2 (two) times daily with a meal. TAKE 1 TABLET (7.5 MG TOTAL) BY MOUTH 2 (TWO) TIMES DAILY WITH A MEAL. 10/10/22   Bensimhon, Shaune Pascal, MD  Multiple Vitamin (MULTIVITAMIN WITH MINERALS)  TABS tablet Take 1 tablet by mouth daily. 12/15/21   Hongalgi, Lenis Dickinson, MD  naphazoline-pheniramine (VISINE) 0.025-0.3 % ophthalmic solution Place 1 drop into both eyes 4 (four) times daily as needed for eye irritation.    [provider]  nitroGLYCERIN (NITROSTAT) 0.4 MG SL tablet Place 1 tablet (0.4 mg total) under the tongue every 5 (five) minutes as needed for chest pain. 02/14/19   Shirley Friar, PA-C  potassium chloride (KLOR-CON M) 10 MEQ tablet Take 4 tablets (40 mEq total) by mouth 2 (two) times daily. Take extra 4 tablets (40 meq total) when you take PRN metolazone. 01/15/23   Rafael Bihari, FNP  spironolactone (ALDACTONE) 25 MG tablet Take 1 tablet (25 mg total) by mouth daily. 05/19/22   [name], Maricela Bo, FNP  torsemide (DEMADEX) 20 MG tablet Take 4 tablets (80 mg total) by mouth every morning AND 2 tablets (40 mg total) every evening.  12/26/22 03/26/23  Rafael Bihari, FNP      Allergies    Codeine    Review of Systems   Review of Systems  Respiratory:  Positive for shortness of breath.     Physical Exam Updated Vital Signs BP 96/85   Pulse 95   Temp 98 F (36.7 C) (Oral)   Resp 17   Ht 4\' 11"  (1.499 m)   Wt 46.7 kg   SpO2 100%   BMI 20.79 kg/m  Physical Exam Vitals and nursing note reviewed.  Neck:     Vascular: JVD present.  Cardiovascular:     Rate and Rhythm: Regular rhythm.  Pulmonary:     Comments: Relatively clear breath sounds bilaterally. Abdominal:     Tenderness: There is no abdominal tenderness.  Musculoskeletal:     Cervical back: Neck supple.     Right lower leg: No edema.     Left lower leg: No edema.  Skin:    Capillary Refill: Capillary refill takes less than 2 seconds.  Neurological:     Mental Status: She is alert and oriented to person, place, and time.     ED Results / Procedures / Treatments   Labs (all labs ordered are listed, but only abnormal results are displayed) Labs Reviewed  BASIC METABOLIC PANEL - Abnormal; Notable for the following components:      Result Value   Glucose, Bld 127 (*)    All other components within normal limits  CBC - Abnormal; Notable for the following components:   RDW 17.9 (*)    All other components within normal limits  BRAIN NATRIURETIC PEPTIDE - Abnormal; Notable for the following components:   B Natriuretic Peptide 1,397.9 (*)    All other components within normal limits  HEPATIC FUNCTION PANEL - Abnormal; Notable for the following components:   Bilirubin, Direct 0.3 (*)    All other components within normal limits  TROPONIN I (HIGH SENSITIVITY) - Abnormal; Notable for the following components:   Troponin I (High Sensitivity) 28 (*)    All other components within normal limits  RESP PANEL BY RT-PCR (RSV, FLU A&B, COVID)  RVPGX2  LIPASE, BLOOD  HEPATIC FUNCTION PANEL  MAGNESIUM  LIPASE, BLOOD  MAGNESIUM  DIGOXIN LEVEL   TROPONIN I (HIGH SENSITIVITY)    EKG EKG Interpretation  Date/Time:  Thursday March 01 2023 16:08:04 EDT Ventricular Rate:  94 PR Interval:  86 QRS Duration: 160 QT Interval:  450 QTC Calculation: 562 R Axis:   -81 Text Interpretation: Sinus rhythm with short PR Left  axis deviation Non-specific intra-ventricular conduction block Left ventricular hypertrophy ( R in aVL , Cornell product )   Interpretation is limited due to artifact. When compared with ECG of 12-Dec-2022 15:04, Persistent artifact. Confirmed by Davonna Belling [phone]) on 03/01/2023 4:55:26 PM  Radiology DG Chest Portable 1 View  Result Date: 03/01/2023 CLINICAL DATA:  62 year old female for since for evaluation of shortness of breath. EXAM: PORTABLE CHEST 1 VIEW COMPARISON:  November 21, 2022 FINDINGS: LEFT-sided single lead pacer defibrillator remains in place. Nerve stimulator over the RIGHT chest, unchanged. EKG leads project over the chest. Median sternotomy changes for CABG with stable cardiomediastinal contours and hilar structures. Cardiomegaly as before. Lungs are clear without signs of effusion or pneumothorax. On limited assessment there is no acute skeletal process. IMPRESSION: 1. No acute cardiopulmonary disease. 2. Median sternotomy changes for CABG with cardiomegaly and central pulmonary vascular congestion. 3. Obscured RIGHT heart border as seen on previous imaging related to perihilar radiation changes about the RIGHT hilum. Electronically Signed   By: Zetta Bills M.D.   On: 03/01/2023 18:17    Procedures Procedures    Medications Ordered in ED Medications  albuterol (PROVENTIL) (2.5 MG/3ML) 0.083% nebulizer solution 5 mg (5 mg Nebulization Given 03/01/23 1758)  ipratropium (ATROVENT) nebulizer solution 0.5 mg (0.5 mg Nebulization Given 03/01/23 1758)    ED Course/ Medical Decision Making/ A&P                             Medical Decision Making Amount and/or Complexity of Data Reviewed Labs:  ordered. Radiology: ordered.  Risk Prescription drug management. Decision regarding hospitalization.   Patient is shortness of breath over the last few days.  Also some nausea vomiting and some loose stool.  History of COPD CHF and heart failure.  Has as needed oxygen at home.  States she cannot lay flat.  Weight is down overall but does have JVD and feels as if she is carrying extra fluid.  Will get x-ray and basic blood work.  I have reviewed cardiology note.  Differential diagnosis includes CHF infection dehydration COPD.  Chest x-ray shows chronic changes.  However BNP is elevated.  Blood work otherwise reassuring.  Blood pressure appears to be near baseline but weight is down.  Has both features of COPD and some CHF although weight is down overall.  Cannot lie flat and does have dyspnea.  Will discuss with hospitalist for admission.        Final Clinical Impression(s) / ED Diagnoses Final diagnoses:  SOB (shortness of breath)  Congestive heart failure, unspecified HF chronicity, unspecified heart failure type ([place])  Chronic obstructive pulmonary disease, unspecified COPD type [facility])    Rx / DC Orders ED Discharge Orders     None         Davonna Belling, MD 03/01/23 2027

## 2023-03-01 NOTE — H&P (Signed)
History and Physical    Brittany [name] [id number] DOB: 1961-01-19 DOA: 03/01/2023  PCP: Traci Sermon, PA-C   Patient coming from: Home   Chief Complaint: SOB   HPI: Brittany [name] is a 62 y.o. female with medical history significant for COPD, history of small cell lung cancer, chronic hypoxic respiratory failure, chronic combined systolic and diastolic CHF, and atrial fibrillation on Eliquis who presents to the emergency department with progressive shortness of breath.  Patient reports approximately 3 days of worsening shortness of breath.  She has had a productive cough associated with this.  She denies any chest pain, fever, or leg swelling.  She reports that her weight has been down several pounds.  ED Course: Upon arrival to the ED, patient is found to be afebrile and saturating 100% on 4 L/min of supplemental oxygen with tachypnea and systolic blood pressure in the 90s.  EKG demonstrates sinus rhythm with IVCD and LVH.  Chest x-ray negative for acute findings.  Labs are most notable for BNP 1398, troponin 28, and negative respiratory virus panel.  Patient was treated with albuterol and Atrovent in the ED.  Review of Systems:  All other systems reviewed and apart from HPI, are negative.  Past Medical History:  Diagnosis Date   Acute respiratory failure ([place]) [id number]   Acute systolic congestive heart failure ([place]) 02/03/2018   AICD (automatic cardioverter/defibrillator) present    Allergy    Anxiety    Asthma    DM2 (diabetes mellitus, type 2) ([place]) 10/20/2020   Hypertension    PAF (paroxysmal atrial fibrillation) ([place])    Presence of permanent cardiac pacemaker    Prophylactic measure 08/03/14-08/19/14   Prophyl. 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   LIMA to LAD, SVG to D1, SVG to OM1, EVH via right thigh with implantation of Impella LD LVAD via direct aortic approach   Small cell lung cancer ([place]) 03/16/2014    Past  Surgical History:  Procedure Laterality Date   BRONCHIAL BRUSHINGS  10/25/2020   Procedure: BRONCHIAL BRUSHINGS;  Surgeon: Collene Gobble, MD;  [facility] END[facility]COPY;  Service: Pulmonary;;   BRONCHIAL NEEDLE ASPIRATION BIOPSY  10/25/2020   Procedure: BRONCHIAL NEEDLE ASPIRATION BIOPSIES;  Surgeon: Collene Gobble, MD;  Location: [place] ENDOSCOPY;  Service: Pulmonary;;   CARDIAC DEFIBRILLATOR PLACEMENT  08/15/2018   MDT Visia AF MRI VR ICD implanted by Dr Loel Lofty for primary prevention of sudden   CESAREAN SECTION     CORONARY ARTERY BYPASS GRAFT N/A 02/03/2018   Procedure: CORONARY ARTERY BYPASS GRAFTING (CABG);  Surgeon: Rexene Alberts, MD;  Location: [place];  Service: Open Heart Surgery;  Laterality: N/A;  Time 3 using left internal mammary artery and endoscopically harvested right saphenous vein   CORONARY BALLOON ANGIOPLASTY N/A 02/03/2018   Procedure: CORONARY BALLOON ANGIOPLASTY;  Surgeon: Martinique, Peter M, MD;  Location: [place] CV LAB;  Service: Cardiovascular;  Laterality: N/A;   CORONARY STENT INTERVENTION N/A 02/12/2019   Procedure: CORONARY STENT INTERVENTION;  Surgeon: Wellington Hampshire, MD;  Location: [place] CV LAB;  Service: Cardiovascular;  Laterality: N/A;   CORONARY/GRAFT ACUTE MI REVASCULARIZATION N/A 02/03/2018   Procedure: Coronary/Graft Acute MI Revascularization;  Surgeon: Martinique, Peter M, MD;  Location: [place] CV LAB;  Service: Cardiovascular;  Laterality: N/A;   ENDOBRONCHIAL ULTRASOUND N/A 10/25/2020   Procedure: ENDOBRONCHIAL ULTRASOUND;  Surgeon: Collene Gobble, MD;  Location: [facility] ENDOSCOPY;  Service: Pulmonary;  Laterality: N/A;   FLEXIBLE BRONCHOSCOPY  10/25/2020   Procedure: FLEXIBLE BRONCHOSCOPY;  Surgeon: Collene Gobble, MD;  Location: [facility] ENDOSCOPY;  Service: Pulmonary;;   IABP INSERTION N/A 02/03/2018   Procedure: IABP Insertion;  Surgeon: Martinique, Peter M, MD;  Location: [place] CV LAB;  Service: Cardiovascular;  Laterality: N/A;   INTRAOPERATIVE  TRANSESOPHAGEAL ECHOCARDIOGRAM N/A 02/03/2018   Procedure: INTRAOPERATIVE TRANSESOPHAGEAL ECHOCARDIOGRAM;  Surgeon: Rexene Alberts, MD;  Location: [place];  Service: Open Heart Surgery;  Laterality: N/A;   LEFT HEART CATH AND CORONARY ANGIOGRAPHY N/A 02/03/2018   Procedure: LEFT HEART CATH AND CORONARY ANGIOGRAPHY;  Surgeon: Martinique, Peter M, MD;  Location: [place] CV LAB;  Service: Cardiovascular;  Laterality: N/A;   LEFT HEART CATH AND CORS/GRAFTS ANGIOGRAPHY N/A 02/12/2019   Procedure: LEFT HEART CATH AND CORS/GRAFTS ANGIOGRAPHY;  Surgeon: Wellington Hampshire, MD;  Location: [place] CV LAB;  Service: Cardiovascular;  Laterality: N/A;   MEDIASTINOSCOPY N/A 03/11/2014   Procedure: MEDIASTINOSCOPY;  Surgeon: Melrose Nakayama, MD;  Location: [place];  Service: Thoracic;  Laterality: N/A;   PLACEMENT OF IMPELLA LEFT VENTRICULAR ASSIST DEVICE  02/03/2018   Procedure: PLACEMENT OF [place] LEFT VENTRICULAR ASSIST DEVICE LD;  Surgeon: Rexene Alberts, MD;  Location: [place];  Service: Open Heart Surgery;;   REMOVAL OF [place] LEFT VENTRICULAR ASSIST DEVICE N/A 02/08/2018   Procedure: REMOVAL OF [place] LEFT VENTRICULAR ASSIST DEVICE;  Surgeon: Rexene Alberts, MD;  Location: [place];  Service: Open Heart Surgery;  Laterality: N/A;   RIGHT HEART CATH N/A 02/03/2018   Procedure: RIGHT HEART CATH;  Surgeon: Martinique, Peter M, MD;  Location: [place] CV LAB;  Service: Cardiovascular;  Laterality: N/A;   RIGHT HEART CATH N/A 05/09/2018   Procedure: RIGHT HEART CATH;  Surgeon: Jolaine Artist, MD;  Location: [place] CV LAB;  Service: Cardiovascular;  Laterality: N/A;   TEE WITHOUT CARDIOVERSION N/A 02/08/2018   Procedure: TRANSESOPHAGEAL ECHOCARDIOGRAM (TEE);  Surgeon: Rexene Alberts, MD;  Location: [place];  Service: Open Heart Surgery;  Laterality: N/A;   TUBAL LIGATION     VIDEO BRONCHOSCOPY WITH ENDOBRONCHIAL ULTRASOUND N/A 03/11/2014   Procedure: VIDEO BRONCHOSCOPY WITH ENDOBRONCHIAL ULTRASOUND;   Surgeon: Melrose Nakayama, MD;  Location: [place];  Service: Thoracic;  Laterality: N/A;    Social History:   reports that she quit smoking about 8 years ago. Her smoking use included cigarettes. She has a 20.00 pack-year smoking history. She has never used smokeless tobacco. She reports current alcohol use of about 5.0 standard drinks of alcohol per week. She reports current drug use. Drug: Marijuana.  Allergies  Allergen Reactions   Codeine Nausea And Vomiting    Family History  Problem Relation Age of Onset   Heart disease Mother    Hypertension Mother    Heart attack Mother    Hypertension Maternal Grandmother    Cancer Maternal Grandmother    Diabetes Paternal Grandmother    Stroke Neg Hx      Prior to Admission medications   Medication Sig Start Date End Date Taking? Authorizing Provider  albuterol (VENTOLIN HFA) 108 (90 Base) MCG/ACT inhaler Inhale 2 puffs into the lungs every 6 (six) hours as needed for wheezing or shortness of breath. 06/20/22   Domenic Polite, MD  apixaban (ELIQUIS) 5 MG TABS tablet Take 1 tablet (5 mg total) by mouth 2 (two) times daily. 11/24/22   Hongalgi, Lenis Dickinson, MD  atorvastatin (LIPITOR) 80 MG tablet Take 1 tablet (80 mg total) by mouth daily at  6 PM. 04/16/22   Modena Jansky, MD  blood glucose meter kit and supplies KIT Dispense based on patient and insurance preference. Use up to four times daily as directed. 04/11/21   Bonnielee Haff, MD  dapagliflozin propanediol (FARXIGA) 10 MG TABS tablet Take 1 tablet by mouth daily. 09/04/22   Bensimhon, Shaune Pascal, MD  digoxin (LANOXIN) 0.125 MG tablet Take 1 tablet (0.125 mg total) by mouth daily. 07/13/22   Rosita Fire, Brittainy M, PA-C  Glucerna (GLUCERNA) LIQD Take 237 mLs by mouth See admin instructions. Takes 1 bottle twice a week    [provider]  ipratropium-albuterol (DUONEB) 0.5-2.5 (3) MG/3ML SOLN Take 3 mLs by nebulization every 4 (four) hours as needed (for shortness of breath). 07/12/22    Larey Dresser, MD  ivabradine (CORLANOR) 7.5 MG TABS tablet Take 1 tablet (7.5 mg total) by mouth 2 (two) times daily with a meal. TAKE 1 TABLET (7.5 MG TOTAL) BY MOUTH 2 (TWO) TIMES DAILY WITH A MEAL. 10/10/22   Bensimhon, Shaune Pascal, MD  Multiple Vitamin (MULTIVITAMIN WITH MINERALS) TABS tablet Take 1 tablet by mouth daily. 12/15/21   Hongalgi, Lenis Dickinson, MD  naphazoline-pheniramine (VISINE) 0.025-0.3 % ophthalmic solution Place 1 drop into both eyes 4 (four) times daily as needed for eye irritation.    [provider]  nitroGLYCERIN (NITROSTAT) 0.4 MG SL tablet Place 1 tablet (0.4 mg total) under the tongue every 5 (five) minutes as needed for chest pain. 02/14/19   Shirley Friar, PA-C  potassium chloride (KLOR-CON M) 10 MEQ tablet Take 4 tablets (40 mEq total) by mouth 2 (two) times daily. Take extra 4 tablets (40 meq total) when you take PRN metolazone. 01/15/23   Rafael Bihari, FNP  spironolactone (ALDACTONE) 25 MG tablet Take 1 tablet (25 mg total) by mouth daily. 05/19/22   [name], Maricela Bo, FNP  torsemide (DEMADEX) 20 MG tablet Take 4 tablets (80 mg total) by mouth every morning AND 2 tablets (40 mg total) every evening. 12/26/22 03/26/23  Rafael Bihari, FNP    Physical Exam: Vitals:   03/01/23 1900 03/01/23 1915 03/01/23 1935 03/01/23 2030  BP: 96/85   92/75  Pulse: (!) 101 95  96  Resp: (!) 27 17  19   Temp:   98 F (36.7 C)   TempSrc:   Oral   SpO2: 100% 100%  100%  Weight:      Height:         Constitutional: NAD, no pallor or diaphoresis   Eyes: PERTLA, lids and conjunctivae normal ENMT: Mucous membranes are moist. Posterior pharynx clear of any exudate or lesions.   Neck: supple, no masses  Respiratory: Prolonged expiratory phase, wheezing. No accessory muscle use.  Cardiovascular: S1 & S2 heard, regular rate and rhythm. No extremity edema.  Abdomen: No distension, no tenderness, soft. Bowel sounds active.  Musculoskeletal: no clubbing / cyanosis. No  joint deformity upper and lower extremities.   Skin: no significant rashes, lesions, ulcers. Warm, dry, well-perfused. Neurologic: CN 2-12 grossly intact. Moving all extremities. Alert and oriented.  Psychiatric: Calm. Cooperative.    Labs and Imaging on Admission: I have personally reviewed following labs and imaging studies  CBC: Recent Labs  Lab 03/01/23 1620  WBC 7.4  HGB 14.4  HCT 45.9  MCV 90.7  PLT [id number]   Basic Metabolic Panel: Recent Labs  Lab 03/01/23 1620 03/01/23 1750  NA 136  --   K 4.4  --   CL 101  --  CO2 27  --   GLUCOSE 127*  --   BUN 19  --   CREATININE 0.95  --   CALCIUM 9.6  --   MG 2.2 2.1   GFR: Estimated Creatinine Clearance: 41.9 mL/min (by C-G formula based on SCr of 0.95 mg/dL). Liver Function Tests: Recent Labs  Lab 03/01/23 1620 03/01/23 1750  AST 37 26  ALT 19 18  ALKPHOS 85 88  BILITOT 1.0 0.8  PROT 7.4 7.9  ALBUMIN 3.6 3.8   Recent Labs  Lab 03/01/23 1620 03/01/23 1750  LIPASE 28 29   No results for input(s): "AMMONIA" in the last 168 hours. Coagulation Profile: No results for input(s): "INR", "PROTIME" in the last 168 hours. Cardiac Enzymes: No results for input(s): "CKTOTAL", "CKMB", "CKMBINDEX", "TROPONINI" in the last 168 hours. BNP (last 3 results) No results for input(s): "PROBNP" in the last 8760 hours. HbA1C: No results for input(s): "HGBA1C" in the last 72 hours. CBG: No results for input(s): "GLUCAP" in the last 168 hours. Lipid Profile: No results for input(s): "CHOL", "HDL", "LDLCALC", "TRIG", "CHOLHDL", "LDLDIRECT" in the last 72 hours. Thyroid Function Tests: No results for input(s): "TSH", "T4TOTAL", "FREET4", "T3FREE", "THYROIDAB" in the last 72 hours. Anemia Panel: No results for input(s): "VITAMINB12", "FOLATE", "FERRITIN", "TIBC", "IRON", "RETICCTPCT" in the last 72 hours. Urine analysis:    Component Value Date/Time   COLORURINE YELLOW 07/09/2022 1530   APPEARANCEUR CLEAR 07/09/2022 1530    LABSPEC 1.009 07/09/2022 1530   PHURINE 5.0 07/09/2022 1530   GLUCOSEU 50 (A) 07/09/2022 1530   HGBUR SMALL (A) 07/09/2022 1530   BILIRUBINUR NEGATIVE 07/09/2022 1530   KETONESUR NEGATIVE 07/09/2022 1530   PROTEINUR NEGATIVE 07/09/2022 1530   UROBILINOGEN 1.0 03/15/2015 1117   NITRITE NEGATIVE 07/09/2022 1530   LEUKOCYTESUR MODERATE (A) 07/09/2022 1530   Sepsis Labs: @LABRCNTIP (procalcitonin:4,lacticidven:4) ) Recent Results (from the past 240 hour(s))  Resp panel by RT-PCR (RSV, Flu A&B, Covid) Anterior Nasal Swab     Status: None   Collection Time: 03/01/23  5:50 PM   Specimen: Anterior Nasal Swab  Result Value Ref Range Status   SARS Coronavirus 2 by RT PCR NEGATIVE NEGATIVE Final   Influenza A by PCR NEGATIVE NEGATIVE Final   Influenza B by PCR NEGATIVE NEGATIVE Final    Comment: (NOTE) The Xpert Xpress SARS-CoV-2/FLU/RSV plus assay is intended as an aid in the diagnosis of influenza from Nasopharyngeal swab specimens and should not be used as a sole basis for treatment. Nasal washings and aspirates are unacceptable for Xpert Xpress SARS-CoV-2/FLU/RSV testing.  Fact Sheet for Patients: EntrepreneurPulse.com.au  Fact Sheet for Healthcare Providers: IncredibleEmployment.be  This test is not yet approved or cleared by the [place] FDA and has been authorized for detection and/or diagnosis of SARS-CoV-2 by FDA under an Emergency Use Authorization (EUA). This EUA will remain in effect (meaning this test can be used) for the duration of the COVID-19 declaration under Section 564(b)(1) of the Act, 21 U.S.C. section 360bbb-3(b)(1), unless the authorization is terminated or revoked.     Resp Syncytial Virus by PCR NEGATIVE NEGATIVE Final    Comment: (NOTE) Fact Sheet for Patients: EntrepreneurPulse.com.au  Fact Sheet for Healthcare Providers: IncredibleEmployment.be  This test is not yet approved  or cleared by the [place] FDA and has been authorized for detection and/or diagnosis of SARS-CoV-2 by FDA under an Emergency Use Authorization (EUA). This EUA will remain in effect (meaning this test can be used) for the duration of the COVID-19 declaration under  Section 564(b)(1) of the Act, 21 U.S.C. section 360bbb-3(b)(1), unless the authorization is terminated or revoked.  Performed at [facility], [employer] [address], [place], [place] [address]      Radiological Exams on Admission: DG Chest Portable 1 View  Result Date: 03/01/2023 CLINICAL DATA:  62 year old female for since for evaluation of shortness of breath. EXAM: PORTABLE CHEST 1 VIEW COMPARISON:  November 21, 2022 FINDINGS: LEFT-sided single lead pacer defibrillator remains in place. Nerve stimulator over the RIGHT chest, unchanged. EKG leads project over the chest. Median sternotomy changes for CABG with stable cardiomediastinal contours and hilar structures. Cardiomegaly as before. Lungs are clear without signs of effusion or pneumothorax. On limited assessment there is no acute skeletal process. IMPRESSION: 1. No acute cardiopulmonary disease. 2. Median sternotomy changes for CABG with cardiomegaly and central pulmonary vascular congestion. 3. Obscured RIGHT heart border as seen on previous imaging related to perihilar radiation changes about the RIGHT hilum. Electronically Signed   By: Zetta Bills M.D.   On: 03/01/2023 18:17    EKG: Independently reviewed. Sinus rhythm, non-specific IVCD, LVH.   Assessment/Plan  1. COPD exacerbation; chronic hypoxic respiratory failure  - Culture sputum, start antibiotic and systemic steroid, schedule DuoNeb, use additional albuterol as needed, and continue supplemental O2    2. Chronic combined systolic & diastolic CHF   - Pt reports wt is down a few pounds from baseline; BNP similar to prior values; no peripheral edema noted   - Continue ivabradine, digoxin, and torsemide,  monitor weight and I/Os    3. PAF  - Continue Eliquis   4. CAD  - No anginal complaints  - Continue Lipitor    5. Hx of SCLC  - S/p radiotherapy, systemic chemotherapy, and prophylactic cranial irradiation in 2015    DVT prophylaxis: Eliquis  Code Status: Full  Level of Care: Level of care: Telemetry Cardiac Family Communication: None present   Disposition Plan:  Patient is from: Home  Anticipated d/c is to: TBD Anticipated d/c date is: Possibly as early as 3/22 or 03/03/23 Patient currently: Pending improved/stable respiratory status  Consults called: none  Admission status: Observation     Vianne Bulls, MD [facility]  03/01/2023, 8:52 PM

## 2023-03-01 NOTE — ED Triage Notes (Signed)
Pt arrived via [facility] from home for increased SOBx3-4d. Pt wears 2L 02 per [place] at baseline, but has had to increase 02 to 4L  today. Per EMS, pt has rales sounds in lungs. Pt has a defib/pacemaker and it's medtronic. Pt has biostem on right side of body, EMS was unable to get a clear EKG due to that.

## 2023-03-02 ENCOUNTER — Observation Stay (HOSPITAL_COMMUNITY): Payer: BC Managed Care – PPO

## 2023-03-02 DIAGNOSIS — I2510 Atherosclerotic heart disease of native coronary artery without angina pectoris: Secondary | ICD-10-CM | POA: Diagnosis present

## 2023-03-02 DIAGNOSIS — Z923 Personal history of irradiation: Secondary | ICD-10-CM | POA: Diagnosis not present

## 2023-03-02 DIAGNOSIS — R0602 Shortness of breath: Secondary | ICD-10-CM | POA: Diagnosis present

## 2023-03-02 DIAGNOSIS — J441 Chronic obstructive pulmonary disease with (acute) exacerbation: Secondary | ICD-10-CM | POA: Diagnosis present

## 2023-03-02 DIAGNOSIS — Z833 Family history of diabetes mellitus: Secondary | ICD-10-CM | POA: Diagnosis not present

## 2023-03-02 DIAGNOSIS — Z8249 Family history of ischemic heart disease and other diseases of the circulatory system: Secondary | ICD-10-CM | POA: Diagnosis not present

## 2023-03-02 DIAGNOSIS — Z9221 Personal history of antineoplastic chemotherapy: Secondary | ICD-10-CM | POA: Diagnosis not present

## 2023-03-02 DIAGNOSIS — I480 Paroxysmal atrial fibrillation: Secondary | ICD-10-CM | POA: Diagnosis present

## 2023-03-02 DIAGNOSIS — Z885 Allergy status to narcotic agent status: Secondary | ICD-10-CM | POA: Diagnosis not present

## 2023-03-02 DIAGNOSIS — Z95810 Presence of automatic (implantable) cardiac defibrillator: Secondary | ICD-10-CM | POA: Diagnosis not present

## 2023-03-02 DIAGNOSIS — Z79899 Other long term (current) drug therapy: Secondary | ICD-10-CM | POA: Diagnosis not present

## 2023-03-02 DIAGNOSIS — E119 Type 2 diabetes mellitus without complications: Secondary | ICD-10-CM | POA: Diagnosis present

## 2023-03-02 DIAGNOSIS — Z951 Presence of aortocoronary bypass graft: Secondary | ICD-10-CM | POA: Diagnosis not present

## 2023-03-02 DIAGNOSIS — I5043 Acute on chronic combined systolic (congestive) and diastolic (congestive) heart failure: Secondary | ICD-10-CM | POA: Diagnosis present

## 2023-03-02 DIAGNOSIS — Z9851 Tubal ligation status: Secondary | ICD-10-CM | POA: Diagnosis not present

## 2023-03-02 DIAGNOSIS — I255 Ischemic cardiomyopathy: Secondary | ICD-10-CM | POA: Diagnosis present

## 2023-03-02 DIAGNOSIS — F419 Anxiety disorder, unspecified: Secondary | ICD-10-CM | POA: Diagnosis present

## 2023-03-02 DIAGNOSIS — Z87891 Personal history of nicotine dependence: Secondary | ICD-10-CM | POA: Diagnosis not present

## 2023-03-02 DIAGNOSIS — Z7901 Long term (current) use of anticoagulants: Secondary | ICD-10-CM | POA: Diagnosis not present

## 2023-03-02 DIAGNOSIS — Z1152 Encounter for screening for COVID-19: Secondary | ICD-10-CM | POA: Diagnosis not present

## 2023-03-02 DIAGNOSIS — J9621 Acute and chronic respiratory failure with hypoxia: Secondary | ICD-10-CM | POA: Diagnosis present

## 2023-03-02 DIAGNOSIS — Z85118 Personal history of other malignant neoplasm of bronchus and lung: Secondary | ICD-10-CM | POA: Diagnosis not present

## 2023-03-02 DIAGNOSIS — I2489 Other forms of acute ischemic heart disease: Secondary | ICD-10-CM | POA: Diagnosis present

## 2023-03-02 DIAGNOSIS — I110 Hypertensive heart disease with heart failure: Secondary | ICD-10-CM | POA: Diagnosis present

## 2023-03-02 LAB — BASIC METABOLIC PANEL
Anion gap: 9 (ref 5–15)
BUN: 18 mg/dL (ref 8–23)
CO2: 25 mmol/L (ref 22–32)
Calcium: 9.3 mg/dL (ref 8.9–10.3)
Chloride: 101 mmol/L (ref 98–111)
Creatinine, Ser: 0.92 mg/dL (ref 0.44–1.00)
GFR, Estimated: >60 mL/min (ref >60)
Glucose, Bld: 295 mg/dL — ABNORMAL HIGH (ref 70–99)
Potassium: 3.7 mmol/L (ref 3.5–5.1)
Sodium: 135 mmol/L (ref 135–145)

## 2023-03-02 LAB — CBC
HCT: 45.8 % (ref 36.0–46.0)
Hemoglobin: 14.6 g/dL (ref 12.0–15.0)
MCH: 29 pg (ref 26.0–34.0)
MCHC: 31.9 g/dL (ref 30.0–36.0)
MCV: 91.1 fL (ref 80.0–100.0)
Platelets: 198 10*3/uL (ref 150–400)
RBC: 5.03 MIL/uL (ref 3.87–5.11)
RDW: 17.7 % — ABNORMAL HIGH (ref 11.5–15.5)
WBC: 7.8 10*3/uL (ref 4.0–10.5)
nRBC: 0 % (ref 0.0–0.2)

## 2023-03-02 LAB — MRSA NEXT GEN BY PCR, NASAL: MRSA by PCR Next Gen: NOT DETECTED

## 2023-03-02 MED ORDER — ENSURE ENLIVE PO LIQD
237.0000 mL | Freq: Two times a day (BID) | ORAL | Status: DC
  Administered 2023-03-02 – 2023-03-03 (×2): 237 mL via ORAL

## 2023-03-02 MED ORDER — SPIRONOLACTONE 25 MG PO TABS
25.0000 mg | ORAL_TABLET | Freq: Every day | ORAL | Status: DC
  Administered 2023-03-02: 25 mg via ORAL
  Filled 2023-03-02: qty 1

## 2023-03-02 MED ORDER — DAPAGLIFLOZIN PROPANEDIOL 10 MG PO TABS
10.0000 mg | ORAL_TABLET | Freq: Every day | ORAL | Status: DC
  Administered 2023-03-02: 10 mg via ORAL
  Filled 2023-03-02: qty 1

## 2023-03-02 MED ORDER — FUROSEMIDE 10 MG/ML IJ SOLN
80.0000 mg | Freq: Once | INTRAMUSCULAR | Status: AC
  Administered 2023-03-02: 80 mg via INTRAVENOUS
  Filled 2023-03-02: qty 8

## 2023-03-02 MED ORDER — PROCHLORPERAZINE EDISYLATE 10 MG/2ML IJ SOLN
10.0000 mg | Freq: Four times a day (QID) | INTRAMUSCULAR | Status: DC | PRN
  Administered 2023-03-02: 10 mg via INTRAVENOUS
  Filled 2023-03-02 (×2): qty 2

## 2023-03-02 MED ORDER — MOMETASONE FURO-FORMOTEROL FUM 200-5 MCG/ACT IN AERO
2.0000 | INHALATION_SPRAY | Freq: Two times a day (BID) | RESPIRATORY_TRACT | Status: DC
  Administered 2023-03-02 – 2023-03-03 (×3): 2 via RESPIRATORY_TRACT
  Filled 2023-03-02: qty 8.8

## 2023-03-02 NOTE — Progress Notes (Addendum)
PROGRESS NOTE    Josclyn Waag  [id number] DOB: [birth year] DOA: 03/01/2023 PCP: Traci Sermon, PA-C     Brief Narrative:  Derhonda Mccamish is a 62 y.o. female with medical history significant for COPD, history of small cell lung cancer, chronic hypoxic respiratory failure, chronic combined systolic and diastolic CHF, and atrial fibrillation on Eliquis who presents to the emergency department with progressive shortness of breath.   Patient reports approximately 3 days of worsening shortness of breath.  She has had a productive cough associated with this.  She denies any chest pain, fever, or leg swelling.  She reports that her weight has been down several pounds.  Patient was admitted on 3/21 for COPD exacerbation.  Overnight, patient became worsening shortness of breath, was given IV Lasix for history of CHF.  New events last 24 hours / Subjective: This morning patient continues to have nausea and vomiting.  Breathing seems to have improved, although still has exertional shortness of breath  Assessment & Plan:   Principal Problem:   COPD with acute exacerbation ([facility]) Active Problems:   Chronic HFrEF (heart failure with reduced ejection fraction) (HCC)   AF (paroxysmal atrial fibrillation) (HCC)   Small cell lung cancer (HCC)   Chronic respiratory failure with hypoxia (HCC)   Ischemic cardiomyopathy   CAD (coronary artery disease)   Acute on chronic hypoxemic respiratory failure -Uses 2 L nasal cannula at baseline.  Using 5 L this morning.  Continue to wean down to baseline as tolerated to maintain sat > 88%  COPD exacerbation -COVID, RSV, influenza, MRSA PCR negative -Continue Rocephin, steroid, breathing treatment  Acute on chronic combined systolic and diastolic CHF -BNP [id number] -No evidence of peripheral edema on examination -Continue torsemide, spironolactone, Farxiga -Strict I's and O's and daily weights  Demand ischemia -Troponin trend is  flat 28, 26  Paroxysmal A-fib -Eliquis, digoxin, Corlanor  CAD -Lipitor  History of squamous cell lung cancer -Status post radiotherapy, chemotherapy, prophylactic cranial irradiation 2015  DVT prophylaxis:  apixaban (ELIQUIS) tablet 5 mg  Code Status: Full Family Communication: None at bedside  Disposition Plan:  Status is: Observation The patient will require care spanning > 2 midnights and should be moved to inpatient because: respiratory failure    Antimicrobials:  Anti-infectives (From admission, onward)    Start     Dose/Rate Route Frequency Ordered Stop   03/01/23 2200  cefTRIAXone (ROCEPHIN) 1 g in sodium chloride 0.9 % 100 mL IVPB        1 g 200 mL/hr over 30 Minutes Intravenous Every 24 hours 03/01/23 2052 03/06/23 2159        Objective: Vitals:   03/02/23 0400 03/02/23 0615 03/02/23 0700 03/02/23 0928  BP: (!) 101/59  (!) 106/57   Pulse: 71  73 81  Resp: 15  18   Temp: 97.8 F (36.6 C)  97.6 F (36.4 C)   TempSrc: Oral  Oral   SpO2: 98%  99%   Weight:  44.8 kg    Height:        Intake/Output Summary (Last 24 hours) at 03/02/2023 1043 Last data filed at 03/02/2023 0939 Gross per 24 hour  Intake 103 ml  Output 500 ml  Net -397 ml   Filed Weights   03/01/23 1550 03/01/23 2203 03/02/23 0615  Weight: 46.7 kg 44.2 kg 44.8 kg    Examination:  General exam: Appears calm and comfortable  Respiratory system: Clear to auscultation. Respiratory effort normal. No respiratory distress. No conversational dyspnea.  5 L nasal cannula O2 Cardiovascular system: S1 & S2 heard, RRR. No murmurs. No pedal edema. Gastrointestinal system: Abdomen is nondistended, soft and nontender. Normal bowel sounds heard. Central nervous system: Alert and oriented. No focal neurological deficits. Speech clear.  Extremities: Symmetric in appearance  Skin: No rashes, lesions or ulcers on exposed skin  Psychiatry: Judgement and insight appear normal. Mood & affect appropriate.    Data Reviewed: I have personally reviewed following labs and imaging studies  CBC: Recent Labs  Lab 03/01/23 1620 03/02/23 0026  WBC 7.4 7.8  HGB 14.4 14.6  HCT 45.9 45.8  MCV 90.7 91.1  PLT 228 [id number]   Basic Metabolic Panel: Recent Labs  Lab 03/01/23 1620 03/01/23 1750 03/02/23 0026  NA 136  --  135  K 4.4  --  3.7  CL 101  --  101  CO2 27  --  25  GLUCOSE 127*  --  295*  BUN 19  --  18  CREATININE 0.95  --  0.92  CALCIUM 9.6  --  9.3  MG 2.2 2.1  --    GFR: Estimated Creatinine Clearance: 43.2 mL/min (by C-G formula based on SCr of 0.92 mg/dL). Liver Function Tests: Recent Labs  Lab 03/01/23 1620 03/01/23 1750  AST 37 26  ALT 19 18  ALKPHOS 85 88  BILITOT 1.0 0.8  PROT 7.4 7.9  ALBUMIN 3.6 3.8   Recent Labs  Lab 03/01/23 1620 03/01/23 1750  LIPASE 28 29   No results for input(s): "AMMONIA" in the last 168 hours. Coagulation Profile: No results for input(s): "INR", "PROTIME" in the last 168 hours. Cardiac Enzymes: No results for input(s): "CKTOTAL", "CKMB", "CKMBINDEX", "TROPONINI" in the last 168 hours. BNP (last 3 results) No results for input(s): "PROBNP" in the last 8760 hours. HbA1C: No results for input(s): "HGBA1C" in the last 72 hours. CBG: No results for input(s): "GLUCAP" in the last 168 hours. Lipid Profile: No results for input(s): "CHOL", "HDL", "LDLCALC", "TRIG", "CHOLHDL", "LDLDIRECT" in the last 72 hours. Thyroid Function Tests: No results for input(s): "TSH", "T4TOTAL", "FREET4", "T3FREE", "THYROIDAB" in the last 72 hours. Anemia Panel: No results for input(s): "VITAMINB12", "FOLATE", "FERRITIN", "TIBC", "IRON", "RETICCTPCT" in the last 72 hours. Sepsis Labs: No results for input(s): "PROCALCITON", "LATICACIDVEN" in the last 168 hours.  Recent Results (from the past 240 hour(s))  Resp panel by RT-PCR (RSV, Flu A&B, Covid) Anterior Nasal Swab     Status: None   Collection Time: 03/01/23  5:50 PM   Specimen: Anterior Nasal Swab   Result Value Ref Range Status   SARS Coronavirus 2 by RT PCR NEGATIVE NEGATIVE Final   Influenza A by PCR NEGATIVE NEGATIVE Final   Influenza B by PCR NEGATIVE NEGATIVE Final    Comment: (NOTE) The Xpert Xpress SARS-CoV-2/FLU/RSV plus assay is intended as an aid in the diagnosis of influenza from Nasopharyngeal swab specimens and should not be used as a sole basis for treatment. Nasal washings and aspirates are unacceptable for Xpert Xpress SARS-CoV-2/FLU/RSV testing.  Fact Sheet for Patients: EntrepreneurPulse.com.au  Fact Sheet for Healthcare Providers: IncredibleEmployment.be  This test is not yet approved or cleared by the [place] FDA and has been authorized for detection and/or diagnosis of SARS-CoV-2 by FDA under an Emergency Use Authorization (EUA). This EUA will remain in effect (meaning this test can be used) for the duration of the COVID-19 declaration under Section 564(b)(1) of the Act, 21 U.S.C. section 360bbb-3(b)(1), unless the authorization is terminated or revoked.  Resp Syncytial Virus by PCR NEGATIVE NEGATIVE Final    Comment: (NOTE) Fact Sheet for Patients: EntrepreneurPulse.com.au  Fact Sheet for Healthcare Providers: IncredibleEmployment.be  This test is not yet approved or cleared by the [place] FDA and has been authorized for detection and/or diagnosis of SARS-CoV-2 by FDA under an Emergency Use Authorization (EUA). This EUA will remain in effect (meaning this test can be used) for the duration of the COVID-19 declaration under Section 564(b)(1) of the Act, 21 U.S.C. section 360bbb-3(b)(1), unless the authorization is terminated or revoked.  Performed at [facility], [place] [address] [place], [place] [address]   MRSA Next Gen by PCR, Nasal     Status: None   Collection Time: 03/01/23 10:14 PM   Specimen: Nasal Mucosa; Nasal Swab  Result Value Ref Range  Status   MRSA by PCR Next Gen NOT DETECTED NOT DETECTED Final    Comment: (NOTE) The GeneXpert MRSA Assay (FDA approved for NASAL specimens only), is one component of a comprehensive MRSA colonization surveillance program. It is not intended to diagnose MRSA infection nor to guide or monitor treatment for MRSA infections. Test performance is not FDA approved in patients less than 69 years old. Performed at [facility], [place] [address], [place],  [address]       Radiology Studies: DG Chest Port 1 View  Result Date: 03/02/2023 CLINICAL DATA:  [id number] with shortness of breath onset 3 days ago onto history of CHF. EXAM: PORTABLE CHEST 1 VIEW COMPARISON:  Portable chest yesterday at 5:55 p.m. FINDINGS: 12:37 a.m. Left chest single lead pacer/defibrillator or insertion is unchanged. There are sternotomy and CABG changes and right chest neurostimulator with single wire extending up into the right neck and off of the film at C7. The heart is enlarged, unchanged. There is increased central vascular prominence, development of mild central edema. Minimal pleural effusions are beginning to form. The lungs are emphysematous. There is faint increased parenchymal haziness in the lung bases which could be ground-glass edema or pneumonitis. No other focal acute process is seen. The mediastinum is normally outlined. There is overlying monitor wiring.  No acute skeletal findings. IMPRESSION: 1. Cardiomegaly with increased central vascular prominence and development of mild central edema. 2. Faint increased parenchymal haziness in the lung bases which could be ground-glass edema or pneumonitis. 3. Minimal pleural effusions. Electronically Signed   By: Telford Nab M.D.   On: 03/02/2023 00:54   DG Chest Portable 1 View  Result Date: 03/01/2023 CLINICAL DATA:  62 year old female for since for evaluation of shortness of breath. EXAM: PORTABLE CHEST 1 VIEW COMPARISON:  November 21, 2022 FINDINGS:  LEFT-sided single lead pacer defibrillator remains in place. Nerve stimulator over the RIGHT chest, unchanged. EKG leads project over the chest. Median sternotomy changes for CABG with stable cardiomediastinal contours and hilar structures. Cardiomegaly as before. Lungs are clear without signs of effusion or pneumothorax. On limited assessment there is no acute skeletal process. IMPRESSION: 1. No acute cardiopulmonary disease. 2. Median sternotomy changes for CABG with cardiomegaly and central pulmonary vascular congestion. 3. Obscured RIGHT heart border as seen on previous imaging related to perihilar radiation changes about the RIGHT hilum. Electronically Signed   By: Zetta Bills M.D.   On: 03/01/2023 18:17      Scheduled Meds:  apixaban  5 mg Oral BID   atorvastatin  80 mg Oral q1800   digoxin  0.125 mg Oral Daily   ipratropium-albuterol  3 mL Nebulization Q6H  ivabradine  7.5 mg Oral BID WC   [START ON 03/03/2023] predniSONE  40 mg Oral Q breakfast   sodium chloride flush  3 mL Intravenous Q12H   torsemide  80 mg Oral Daily   Continuous Infusions:  cefTRIAXone (ROCEPHIN)  IV Stopped (03/01/23 2319)     LOS: 0 days   Time spent: 25 minutes   Dessa Phi, DO [facility] 03/02/2023, 10:43 AM   Available via Epic secure chat 7am-7pm After these hours, please refer to coverage provider listed on [email or web address]

## 2023-03-02 NOTE — Progress Notes (Signed)
Initial Nutrition Assessment  DOCUMENTATION CODES:   Severe malnutrition in context of chronic illness  INTERVENTION:   - Ensure Enlive po BID, each supplement provides 350 kcal and 20 grams of protein  - Liberalize diet to REGULAR with 2000 ml fluid restriction to promote PO intake  - Encourage PO intake  NUTRITION DIAGNOSIS:   Severe Malnutrition related to chronic illness (COPD, small cell lung cancer, CHF) as evidenced by severe fat depletion, severe muscle depletion, percent weight loss (9.5% weight loss in 4 months).  GOAL:   Patient will meet greater than or equal to 90% of their needs  MONITOR:   PO intake, Supplement acceptance, Labs, Weight trends, I & O's  REASON FOR ASSESSMENT:   Malnutrition Screening Tool    ASSESSMENT:   62 year old female who presented to the ED on 3/21 with SOB. PMH of COPD, small cell lung cancer s/p chemotherapy, chronic hypoxic respiratory failure, chronic combined systolic and diastolic CHF, atrial fibrillation. Pt admitted with COPD exacerbation.  Spoke with pt at bedside. Noted pt with Ensure supplement at bedside which she had almost completed. Pt reports decreased/poor appetite and PO intake for a few months PTA. She shares that at baseline, she usually eats 1-2 meals daily "if I'm lucky." A meal may include salad without protein or a bowl or cereal. Pt has been drinking 2 Ensure supplements daily at home.  Pt endorses weight loss. She reports a UBW of 107 lbs. She states that she lost weight down to 102 lbs then lost weight further down to 97 lbs. Reviewed weight history in chart. Pt with a 4.7 kg weight loss since 11/08/22. This is a 9.5% weight loss in 4 months which is severe and significant for timeframe. Based on weight loss and NFPE findings, pt meets criteria for severe chronic malnutrition.  Pt denies any issues chewing or swallowing at this time. She endorses nausea that comes and goes and reports that she did vomit this AM  after breakfast. Pt shares that she received some anti-nausea medications that allowed her to eat lunch.  Pt amenable to continuing to receive Ensure supplements during admission. Discussed diet liberalization with pt who would like to have more food options to choose from at mealtimes. Diet liberalized to Regular and 2000 ml fluid restriction maintained.  Medications reviewed and include: IV solu-medrol, torsemide, IV abx  Labs reviewed.  NUTRITION - FOCUSED PHYSICAL EXAM:  Flowsheet Row Most Recent Value  Orbital Region Moderate depletion  Upper Arm Region Moderate depletion  Thoracic and Lumbar Region Moderate depletion  Buccal Region Moderate depletion  Temple Region Moderate depletion  Clavicle Bone Region Severe depletion  Clavicle and Acromion Bone Region Severe depletion  Scapular Bone Region Moderate depletion  Dorsal Hand Moderate depletion  Patellar Region Moderate depletion  Anterior Thigh Region Moderate depletion  Posterior Calf Region Moderate depletion  Edema (RD Assessment) None  Hair Reviewed  Eyes Reviewed  Mouth Reviewed  Skin Reviewed  Nails Reviewed       Diet Order:   Diet Order             Diet regular Room service appropriate? Yes with Assist; Fluid consistency: Thin; Fluid restriction: 2000 mL Fluid  Diet effective now                   EDUCATION NEEDS:   Education needs have been addressed  Skin:  Skin Assessment: Reviewed RN Assessment  Last BM:  03/01/23  Height:   Ht Readings from Last  1 Encounters:  03/01/23 4\' 11"  (1.499 m)    Weight:   Wt Readings from Last 1 Encounters:  03/02/23 44.8 kg    BMI:  Body mass index is 19.95 kg/m.  Estimated Nutritional Needs:   Kcal:  1500-1700  Protein:  75-85 grams  Fluid:  1.5-1.7 L    Gustavus Bryant, MS, RD, LDN Inpatient Clinical Dietitian Please see AMiON for contact information.

## 2023-03-02 NOTE — Progress Notes (Addendum)
[facility] night cross cover note:   I was notified by RN that this patient, who was admitted earlier in the evening for acute COPD exacerbation, is now feeling more short of breath, and is noted to be mildly tachypneic with respiratory rate into the low 20s.  She has chronic hypoxic respiratory failure on 2 L baseline nasal cannula, and is now maintaining oxygen saturations in the mid to high 90s on 4 to 5 L nasal cannula.  The remainder of her vital signs appear stable, in which she is normotensive, with heart rates in the low 100s, and continues to be afebrile. this worsening shortness of breath is not associated with any report of chest pain.    For management of her acute COPD exacerbation, she is already on scheduled duo nebulizer treatments as well as as needed albuterol nebulizers in addition to solumedrol.  She also has a history of paroxysmal atrial fibrillation, for which she is chronically anticoagulated on Eliquis, rendering the possibility of acute pulmonary embolism to be less likely.    She is also noted to have a history of chronic combined systolic/diastolic heart failure, but without evidence of acute decompensation at the time of admission, noting that H&P conveys that the patient is down a few pounds from her baseline weight, while also noting that her presenting BNP is similar to prior values.  Will check updated chest x-ray at this time, given increased risk for ensuing development of acutely decompensated heart failure as a consequence of physiologic stress from her presenting acute COPD exacerbation.  Patient is also going to be receiving a prn albuterol nebulizer treatment at this time.  Will also add serum magnesium and phosphorus levels to morning labs.   Update: updated CXR, per formal radiology read shows e/o interval increase in pulmonary vascular congestion as well as development of mild central edema.  In the setting of her history of chronic combined systolic/diastolic heart  failure, she has already received her home torsemide 80 mg p.o. daily this evening.  In light of her worsening shortness of breath and updated chest x-ray findings, I am now ordering Lasix 80 mg IV x 1 dose now.     Babs Bertin, DO Hospitalist

## 2023-03-02 NOTE — Plan of Care (Signed)
  Problem: Education: Goal: Knowledge of disease or condition will improve Outcome: Progressing Goal: Knowledge of the prescribed therapeutic regimen will improve Outcome: Progressing Goal: Individualized Educational Video(s) Outcome: Progressing   Problem: Activity: Goal: Ability to tolerate increased activity will improve Outcome: Progressing Goal: Will verbalize the importance of balancing activity with adequate rest periods Outcome: Progressing   Problem: Respiratory: Goal: Ability to maintain a clear airway will improve Outcome: Progressing Goal: Levels of oxygenation will improve Outcome: Progressing Goal: Ability to maintain adequate ventilation will improve Outcome: Progressing   Problem: Education: Goal: Knowledge of General Education information will improve Description: Including pain rating scale, medication(s)/side effects and non-pharmacologic comfort measures Outcome: Progressing   Problem: Health Behavior/Discharge Planning: Goal: Ability to manage health-related needs will improve Outcome: Progressing   Problem: Clinical Measurements: Goal: Ability to maintain clinical measurements within normal limits will improve Outcome: Progressing Goal: Will remain free from infection Outcome: Progressing Goal: Diagnostic test results will improve Outcome: Progressing Goal: Respiratory complications will improve Outcome: Progressing Goal: Cardiovascular complication will be avoided Outcome: Progressing   Problem: Activity: Goal: Risk for activity intolerance will decrease Outcome: Progressing   Problem: Nutrition: Goal: Adequate nutrition will be maintained Outcome: Progressing   Problem: Coping: Goal: Level of anxiety will decrease Outcome: Progressing   Problem: Elimination: Goal: Will not experience complications related to bowel motility Outcome: Progressing Goal: Will not experience complications related to urinary retention Outcome: Progressing    Problem: Elimination: Goal: Will not experience complications related to bowel motility Outcome: Progressing Goal: Will not experience complications related to urinary retention Outcome: Progressing   Problem: Pain Managment: Goal: General experience of comfort will improve Outcome: Progressing   Problem: Safety: Goal: Ability to remain free from injury will improve Outcome: Progressing

## 2023-03-02 NOTE — Progress Notes (Deleted)
Wean O2 to baseline of 2L

## 2023-03-02 NOTE — Progress Notes (Signed)
Pt c/o sob , pt sitting upright in bed states that she can't breathe . Sats were 97% on 4LNC , pt increased to [facility] by staff sats are 97% with minimal relief of feeling SOB. Provider notified, see new orders , prn breathing treatment given. Pt states that breathing treatment is helping , sats are 100% on 5LNC. Call bell within reach, encouraged to call if breathing worsens.   03/02/23 0029  Vitals  Temp 97.8 F (36.6 C)  Temp Source Axillary  BP 130/76  MAP (mmHg) 92  BP Location Left Arm  BP Method Automatic  Patient Position (if appropriate) Lying  Pulse Rate 100  Pulse Rate Source Monitor  Resp (!) 22  Level of Consciousness  Level of Consciousness Alert  MEWS COLOR  MEWS Score Color Green  Oxygen Therapy  SpO2 100 %  O2 Device Nasal Cannula  O2 Flow Rate (L/min) 5 L/min  MEWS Score  MEWS Temp 0  MEWS Systolic 0  MEWS Pulse 0  MEWS RR 1  MEWS LOC 0  MEWS Score 1

## 2023-03-02 NOTE — Plan of Care (Signed)

## 2023-03-03 ENCOUNTER — Inpatient Hospital Stay (HOSPITAL_COMMUNITY): Payer: BC Managed Care – PPO

## 2023-03-03 DIAGNOSIS — J441 Chronic obstructive pulmonary disease with (acute) exacerbation: Secondary | ICD-10-CM | POA: Diagnosis not present

## 2023-03-03 LAB — BASIC METABOLIC PANEL
Anion gap: 8 (ref 5–15)
BUN: 34 mg/dL — ABNORMAL HIGH (ref 8–23)
CO2: 28 mmol/L (ref 22–32)
Calcium: 9.5 mg/dL (ref 8.9–10.3)
Chloride: 98 mmol/L (ref 98–111)
Creatinine, Ser: 1.31 mg/dL — ABNORMAL HIGH (ref 0.44–1.00)
GFR, Estimated: 46 mL/min — ABNORMAL LOW (ref >60)
Glucose, Bld: 219 mg/dL — ABNORMAL HIGH (ref 70–99)
Potassium: 3.8 mmol/L (ref 3.5–5.1)
Sodium: 134 mmol/L — ABNORMAL LOW (ref 135–145)

## 2023-03-03 MED ORDER — CEFDINIR 300 MG PO CAPS: 300.0000 mg | ORAL_CAPSULE | Freq: Two times a day (BID) | ORAL | 0 refills | Status: AC

## 2023-03-03 MED ORDER — PREDNISONE 10 MG PO TABS: ORAL_TABLET | ORAL | 0 refills | Status: DC

## 2023-03-03 MED ORDER — IPRATROPIUM-ALBUTEROL 0.5-2.5 (3) MG/3ML IN SOLN
3.0000 mL | Freq: Three times a day (TID) | RESPIRATORY_TRACT | Status: DC
  Administered 2023-03-03: 3 mL via RESPIRATORY_TRACT
  Filled 2023-03-03: qty 3

## 2023-03-03 NOTE — Discharge Summary (Signed)
Physician Discharge Summary  Brittany [name] [id number] DOB: [birth year] DOA: 03/01/2023  PCP: Traci Sermon, PA-C  Admit date: 03/01/2023 Discharge date: 03/03/2023  Admitted From: Home Disposition:  Home   Recommendations for Outpatient Follow-up:  Follow up with PCP in 1 week  Discharge Condition: Stable CODE STATUS: Full  Diet recommendation: Heart healthy   Brief/Interim Summary:  Brittany [name] is a 62 y.o. female with medical history significant for COPD, history of small cell lung cancer, chronic hypoxic respiratory failure, chronic combined systolic and diastolic CHF, and atrial fibrillation on Eliquis who presents to the emergency department with progressive shortness of breath.   Patient reports approximately 3 days of worsening shortness of breath.  She has had a productive cough associated with this.  She denies any chest pain, fever, or leg swelling.  She reports that her weight has been down several pounds.   Patient was admitted on 3/21 for COPD exacerbation.  Overnight, patient became worsening shortness of breath, was given IV Lasix for history of CHF.  On day of discharge, patient was feeling tired from lack of sleep but otherwise well. Tolerating diet without vomiting. O2 levels stable on room air. Without respiratory distress. Patient was discharged home on oral antibiotics and steroid taper.   Discharge Diagnoses:  Principal Problem:   COPD with acute exacerbation ([place]) Active Problems:   Chronic HFrEF (heart failure with reduced ejection fraction) (HCC)   AF (paroxysmal atrial fibrillation) (HCC)   Small cell lung cancer (HCC)   Chronic respiratory failure with hypoxia (HCC)   Ischemic cardiomyopathy   CAD (coronary artery disease)   Acute on chronic hypoxemic respiratory failure -Uses 2 L nasal cannula at baseline.  She was satting 95% on room air this morning.    COPD exacerbation -COVID, RSV, influenza, MRSA PCR  negative -Continue Rocephin --> Cefdinir, steroid --> taper, breathing treatment   Acute on chronic combined systolic and diastolic CHF -BNP [id number] -No evidence of peripheral edema on examination -Continue torsemide, spironolactone, Farxiga (diuretics held on 3/23 due to slight bump in Cr. Can resume at home)  -Strict I's and O's and daily weights   Demand ischemia -Troponin trend is flat 28, 26   Paroxysmal A-fib -Eliquis, digoxin, Corlanor   CAD -Lipitor   History of squamous cell lung cancer -Status post radiotherapy, chemotherapy, prophylactic cranial irradiation 2015    Discharge Instructions  Discharge Instructions     Call MD for:  difficulty breathing, headache or visual disturbances   Complete by: As directed    Call MD for:  extreme fatigue   Complete by: As directed    Call MD for:  hives   Complete by: As directed    Call MD for:  persistant dizziness or light-headedness   Complete by: As directed    Call MD for:  persistant nausea and vomiting   Complete by: As directed    Call MD for:  severe uncontrolled pain   Complete by: As directed    Call MD for:  temperature >100.4   Complete by: As directed    Diet - low sodium heart healthy   Complete by: As directed    Discharge instructions   Complete by: As directed    You were cared for by a hospitalist during your hospital stay. If you have any questions about your discharge medications or the care you received while you were in the hospital after you are discharged, you can call the unit and ask to speak with the hospitalist  on call if the hospitalist that took care of you is not available. Once you are discharged, your primary care physician will handle any further medical issues. Please note that NO REFILLS for any discharge medications will be authorized once you are discharged, as it is imperative that you return to your primary care physician (or establish a relationship with a primary care physician if  you do not have one) for your aftercare needs so that they can reassess your need for medications and monitor your lab values.   Increase activity slowly   Complete by: As directed       Allergies as of 03/03/2023       Reactions   Codeine Nausea And Vomiting        Medication List     TAKE these medications    albuterol 108 (90 Base) MCG/ACT inhaler Commonly known as: VENTOLIN HFA Inhale 2 puffs into the lungs every 6 (six) hours as needed for wheezing or shortness of breath.   apixaban 5 MG Tabs tablet Commonly known as: ELIQUIS Take 1 tablet (5 mg total) by mouth 2 (two) times daily. What changed: how much to take   atorvastatin 80 MG tablet Commonly known as: LIPITOR Take 1 tablet (80 mg total) by mouth daily at 6 PM.   blood glucose meter kit and supplies Kit Dispense based on patient and insurance preference. Use up to four times daily as directed.   cefdinir 300 MG capsule Commonly known as: OMNICEF Take 1 capsule (300 mg total) by mouth 2 (two) times daily for 2 days.   dapagliflozin propanediol 10 MG Tabs tablet Commonly known as: Farxiga Take 1 tablet by mouth daily.   digoxin 0.125 MG tablet Commonly known as: LANOXIN Take 1 tablet (0.125 mg total) by mouth daily.   Ensure Original Liqd Take 2 Bottles by mouth daily.   ipratropium-albuterol 0.5-2.5 (3) MG/3ML Soln Commonly known as: DUONEB Take 3 mLs by nebulization every 4 (four) hours as needed (for shortness of breath).   ivabradine 7.5 MG Tabs tablet Commonly known as: Corlanor Take 1 tablet (7.5 mg total) by mouth 2 (two) times daily with a meal. TAKE 1 TABLET (7.5 MG TOTAL) BY MOUTH 2 (TWO) TIMES DAILY WITH A MEAL.   multivitamin with minerals Tabs tablet Take 1 tablet by mouth daily.   nitroGLYCERIN 0.4 MG SL tablet Commonly known as: NITROSTAT Place 1 tablet (0.4 mg total) under the tongue every 5 (five) minutes as needed for chest pain.   potassium chloride 10 MEQ tablet Commonly  known as: KLOR-CON M Take 4 tablets (40 mEq total) by mouth 2 (two) times daily. Take extra 4 tablets (40 meq total) when you take PRN metolazone. What changed:  how much to take when to take this additional instructions   predniSONE 10 MG tablet Commonly known as: DELTASONE Take 4 tabs for 3 days, then 3 tabs for 3 days, then 2 tabs for 3 days, then 1 tab for 3 days, then 1/2 tab for 4 days.   spironolactone 25 MG tablet Commonly known as: ALDACTONE Take 1 tablet (25 mg total) by mouth daily.   torsemide 20 MG tablet Commonly known as: DEMADEX Take 4 tablets (80 mg total) by mouth every morning AND 2 tablets (40 mg total) every evening. What changed: See the new instructions.   Visine 0.025-0.3 % ophthalmic solution Generic drug: naphazoline-pheniramine Place 1 drop into both eyes 4 (four) times daily as needed for eye irritation.  Follow-up Information     Costella, Vista Mink, PA-C Follow up.   Specialty: Physician Assistant Contact information: [place] [address] [phone]                Allergies  Allergen Reactions   Codeine Nausea And Vomiting      Procedures/Studies: DG CHEST PORT 1 VIEW  Result Date: 03/03/2023 CLINICAL DATA:  COPD. EXAM: PORTABLE CHEST 1 VIEW COMPARISON:  03/02/2023 FINDINGS: Lungs are hyperexpanded. Stable bilateral basilar scarring. The cardio pericardial silhouette is enlarged. Status post CABG. Left-sided AICD again noted with battery pack for stimulator device noted on the right. Telemetry leads overlie the chest. IMPRESSION: Emphysema with bibasilar scarring. No acute cardiopulmonary findings. Electronically Signed   By: Misty Stanley M.D.   On: 03/03/2023 07:41   DG Chest Port 1 View  Result Date: 03/02/2023 CLINICAL DATA:  [id number] with shortness of breath onset 3 days ago onto history of CHF. EXAM: PORTABLE CHEST 1 VIEW COMPARISON:  Portable chest yesterday at 5:55 p.m. FINDINGS: 12:37 a.m. Left  chest single lead pacer/defibrillator or insertion is unchanged. There are sternotomy and CABG changes and right chest neurostimulator with single wire extending up into the right neck and off of the film at C7. The heart is enlarged, unchanged. There is increased central vascular prominence, development of mild central edema. Minimal pleural effusions are beginning to form. The lungs are emphysematous. There is faint increased parenchymal haziness in the lung bases which could be ground-glass edema or pneumonitis. No other focal acute process is seen. The mediastinum is normally outlined. There is overlying monitor wiring.  No acute skeletal findings. IMPRESSION: 1. Cardiomegaly with increased central vascular prominence and development of mild central edema. 2. Faint increased parenchymal haziness in the lung bases which could be ground-glass edema or pneumonitis. 3. Minimal pleural effusions. Electronically Signed   By: Telford Nab M.D.   On: 03/02/2023 00:54   DG Chest Portable 1 View  Result Date: 03/01/2023 CLINICAL DATA:  62 year old female for since for evaluation of shortness of breath. EXAM: PORTABLE CHEST 1 VIEW COMPARISON:  November 21, 2022 FINDINGS: LEFT-sided single lead pacer defibrillator remains in place. Nerve stimulator over the RIGHT chest, unchanged. EKG leads project over the chest. Median sternotomy changes for CABG with stable cardiomediastinal contours and hilar structures. Cardiomegaly as before. Lungs are clear without signs of effusion or pneumothorax. On limited assessment there is no acute skeletal process. IMPRESSION: 1. No acute cardiopulmonary disease. 2. Median sternotomy changes for CABG with cardiomegaly and central pulmonary vascular congestion. 3. Obscured RIGHT heart border as seen on previous imaging related to perihilar radiation changes about the RIGHT hilum. Electronically Signed   By: Zetta Bills M.D.   On: 03/01/2023 18:17       Discharge Exam: Vitals:    03/03/23 0810 03/03/23 1100  BP:  (!) 111/58  Pulse:  94  Resp:  18  Temp:  98.3 F (36.8 C)  SpO2: 98%     General: Pt is alert, awake, not in acute distress Cardiovascular: RRR, S1/S2 +, no edema Respiratory: CTA bilaterally, no wheezing, no rhonchi, no respiratory distress, no conversational dyspnea, on room air  Abdominal: Soft, NT, ND, bowel sounds + Extremities: no edema, no cyanosis Psych: Normal mood and affect, stable judgement and insight     The results of significant diagnostics from this hospitalization (including imaging, microbiology, ancillary and laboratory) are listed below for reference.     Microbiology: Recent Results (from the  past 240 hour(s))  Resp panel by RT-PCR (RSV, Flu A&B, Covid) Anterior Nasal Swab     Status: None   Collection Time: 03/01/23  5:50 PM   Specimen: Anterior Nasal Swab  Result Value Ref Range Status   SARS Coronavirus 2 by RT PCR NEGATIVE NEGATIVE Final   Influenza A by PCR NEGATIVE NEGATIVE Final   Influenza B by PCR NEGATIVE NEGATIVE Final    Comment: (NOTE) The Xpert Xpress SARS-CoV-2/FLU/RSV plus assay is intended as an aid in the diagnosis of influenza from Nasopharyngeal swab specimens and should not be used as a sole basis for treatment. Nasal washings and aspirates are unacceptable for Xpert Xpress SARS-CoV-2/FLU/RSV testing.  Fact Sheet for Patients: EntrepreneurPulse.com.au  Fact Sheet for Healthcare Providers: IncredibleEmployment.be  This test is not yet approved or cleared by the [place] FDA and has been authorized for detection and/or diagnosis of SARS-CoV-2 by FDA under an Emergency Use Authorization (EUA). This EUA will remain in effect (meaning this test can be used) for the duration of the COVID-19 declaration under Section 564(b)(1) of the Act, 21 U.S.C. section 360bbb-3(b)(1), unless the authorization is terminated or revoked.     Resp Syncytial Virus by PCR  NEGATIVE NEGATIVE Final    Comment: (NOTE) Fact Sheet for Patients: EntrepreneurPulse.com.au  Fact Sheet for Healthcare Providers: IncredibleEmployment.be  This test is not yet approved or cleared by the [place] FDA and has been authorized for detection and/or diagnosis of SARS-CoV-2 by FDA under an Emergency Use Authorization (EUA). This EUA will remain in effect (meaning this test can be used) for the duration of the COVID-19 declaration under Section 564(b)(1) of the Act, 21 U.S.C. section 360bbb-3(b)(1), unless the authorization is terminated or revoked.  Performed at [facility], [place] [address], [place], [place] [address]   MRSA Next Gen by PCR, Nasal     Status: None   Collection Time: 03/01/23 10:14 PM   Specimen: Nasal Mucosa; Nasal Swab  Result Value Ref Range Status   MRSA by PCR Next Gen NOT DETECTED NOT DETECTED Final    Comment: (NOTE) The GeneXpert MRSA Assay (FDA approved for NASAL specimens only), is one component of a comprehensive MRSA colonization surveillance program. It is not intended to diagnose MRSA infection nor to guide or monitor treatment for MRSA infections. Test performance is not FDA approved in patients less than 51 years old. Performed at [facility], [place] [address] [place], [place] [address]      Labs: BNP (last 3 results) Recent Labs    11/23/22 0046 12/12/22 1522 03/01/23 1635  BNP 1,271.8* 1,108.4* [id number]*   Basic Metabolic Panel: Recent Labs  Lab 03/01/23 1620 03/01/23 1750 03/02/23 0026 03/03/23 0017  NA 136  --  135 134*  K 4.4  --  3.7 3.8  CL 101  --  101 98  CO2 27  --  25 28  GLUCOSE 127*  --  295* 219*  BUN 19  --  18 34*  CREATININE 0.95  --  0.92 1.31*  CALCIUM 9.6  --  9.3 9.5  MG 2.2 2.1  --   --    Liver Function Tests: Recent Labs  Lab 03/01/23 1620 03/01/23 1750  AST 37 26  ALT 19 18  ALKPHOS 85 88  BILITOT 1.0 0.8  PROT 7.4 7.9  ALBUMIN  3.6 3.8   Recent Labs  Lab 03/01/23 1620 03/01/23 1750  LIPASE 28 29   No results for input(s): "AMMONIA"  in the last 168 hours. CBC: Recent Labs  Lab 03/01/23 1620 03/02/23 0026  WBC 7.4 7.8  HGB 14.4 14.6  HCT 45.9 45.8  MCV 90.7 91.1  PLT 228 198   Cardiac Enzymes: No results for input(s): "CKTOTAL", "CKMB", "CKMBINDEX", "TROPONINI" in the last 168 hours. BNP: Invalid input(s): "POCBNP" CBG: No results for input(s): "GLUCAP" in the last 168 hours. D-Dimer No results for input(s): "DDIMER" in the last 72 hours. Hgb A1c No results for input(s): "HGBA1C" in the last 72 hours. Lipid Profile No results for input(s): "CHOL", "HDL", "LDLCALC", "TRIG", "CHOLHDL", "LDLDIRECT" in the last 72 hours. Thyroid function studies No results for input(s): "TSH", "T4TOTAL", "T3FREE", "THYROIDAB" in the last 72 hours.  Invalid input(s): "FREET3" Anemia work up No results for input(s): "VITAMINB12", "FOLATE", "FERRITIN", "TIBC", "IRON", "RETICCTPCT" in the last 72 hours. Urinalysis    Component Value Date/Time   COLORURINE YELLOW 07/09/2022 1530   APPEARANCEUR CLEAR 07/09/2022 1530   LABSPEC 1.009 07/09/2022 1530   PHURINE 5.0 07/09/2022 1530   GLUCOSEU 50 (A) 07/09/2022 1530   HGBUR SMALL (A) 07/09/2022 1530   BILIRUBINUR NEGATIVE 07/09/2022 1530   KETONESUR NEGATIVE 07/09/2022 1530   PROTEINUR NEGATIVE 07/09/2022 1530   UROBILINOGEN 1.0 03/15/2015 1117   NITRITE NEGATIVE 07/09/2022 1530   LEUKOCYTESUR MODERATE (A) 07/09/2022 1530   Sepsis Labs Recent Labs  Lab 03/01/23 1620 03/02/23 0026  WBC 7.4 7.8   Microbiology Recent Results (from the past 240 hour(s))  Resp panel by RT-PCR (RSV, Flu A&B, Covid) Anterior Nasal Swab     Status: None   Collection Time: 03/01/23  5:50 PM   Specimen: Anterior Nasal Swab  Result Value Ref Range Status   SARS Coronavirus 2 by RT PCR NEGATIVE NEGATIVE Final   Influenza A by PCR NEGATIVE NEGATIVE Final   Influenza B by PCR NEGATIVE  NEGATIVE Final    Comment: (NOTE) The Xpert Xpress SARS-CoV-2/FLU/RSV plus assay is intended as an aid in the diagnosis of influenza from Nasopharyngeal swab specimens and should not be used as a sole basis for treatment. Nasal washings and aspirates are unacceptable for Xpert Xpress SARS-CoV-2/FLU/RSV testing.  Fact Sheet for Patients: EntrepreneurPulse.com.au  Fact Sheet for Healthcare Providers: IncredibleEmployment.be  This test is not yet approved or cleared by the [place] FDA and has been authorized for detection and/or diagnosis of SARS-CoV-2 by FDA under an Emergency Use Authorization (EUA). This EUA will remain in effect (meaning this test can be used) for the duration of the COVID-19 declaration under Section 564(b)(1) of the Act, 21 U.S.C. section 360bbb-3(b)(1), unless the authorization is terminated or revoked.     Resp Syncytial Virus by PCR NEGATIVE NEGATIVE Final    Comment: (NOTE) Fact Sheet for Patients: EntrepreneurPulse.com.au  Fact Sheet for Healthcare Providers: IncredibleEmployment.be  This test is not yet approved or cleared by the [place] FDA and has been authorized for detection and/or diagnosis of SARS-CoV-2 by FDA under an Emergency Use Authorization (EUA). This EUA will remain in effect (meaning this test can be used) for the duration of the COVID-19 declaration under Section 564(b)(1) of the Act, 21 U.S.C. section 360bbb-3(b)(1), unless the authorization is terminated or revoked.  Performed at [facility], [place] [address], [place], [place] [address]   MRSA Next Gen by PCR, Nasal     Status: None   Collection Time: 03/01/23 10:14 PM   Specimen: Nasal Mucosa; Nasal Swab  Result Value Ref Range Status   MRSA by PCR Next Gen  NOT DETECTED NOT DETECTED Final    Comment: (NOTE) The GeneXpert MRSA Assay (FDA approved for NASAL specimens only), is one component  of a comprehensive MRSA colonization surveillance program. It is not intended to diagnose MRSA infection nor to guide or monitor treatment for MRSA infections. Test performance is not FDA approved in patients less than 73 years old. Performed at [facility], [place] [address] [place], [place] [address]      Patient was seen and examined on the day of discharge and was found to be in stable condition. Time coordinating discharge: 25 minutes including assessment and coordination of care, as well as examination of the patient.   SIGNED:  Dessa Phi, DO [facility] 03/03/2023, 11:23 AM

## 2023-03-03 NOTE — Progress Notes (Signed)
Patient refused oxygen for ride home, MD notified.

## 2023-03-03 NOTE — Evaluation (Signed)
Physical Therapy Evaluation Patient Details Name: Brittany [name] MRN: [id number] DOB: 10-Jul-1961 Today's Date: 03/03/2023  History of Present Illness  62 y.o. female presented to ED 03/01/23 with SOB. Admitted for COPD, chronic hypoxic respiratory failure and chronic combined systolic and diastolic CHF. PMH: PAF, hypertension, previous small cell lung cancer treated with chemo/ chest XRT/ prophylactic brain radiation in 2015, CAD s/p CABG, chronic systolic HF EF [id number], CKD stage IIIa, prior history of tobacco abuse  Clinical Impression  Patient evaluated by Physical Therapy with no further acute PT needs identified. All education has been completed and the patient has no further questions. Pt independent with bed mobility, transfers and ambulation on RA. SpO2 >88%O2 with ambulation. Pt has no follow-up Physical Therapy or equipment needs. PT is signing off. Thank you for this referral.        Recommendations for follow up therapy are one component of a multi-disciplinary discharge planning process, led by the attending physician.  Recommendations may be updated based on patient status, additional functional criteria and insurance authorization.  Follow Up Recommendations No PT follow up      Assistance Recommended at Discharge PRN     Equipment Recommendations None recommended by PT     Functional Status Assessment Patient has not had a recent decline in their functional status     Precautions / Restrictions Precautions Precautions: Fall Restrictions Weight Bearing Restrictions: No      Mobility  Bed Mobility Overal bed mobility: Independent                  Transfers Overall transfer level: Independent                      Ambulation/Gait Ambulation/Gait assistance: Independent Gait Distance (Feet): 80 Feet Assistive device: None Gait Pattern/deviations: Step-through pattern, WFL(Within Functional Limits), Shuffle Gait velocity: slightly hurried  due to irritation of having to get up with therapy Gait velocity interpretation: >2.62 ft/sec, indicative of community ambulatory   General Gait Details: overall WFL        Balance Overall balance assessment: Mild deficits observed, not formally tested                                           Pertinent Vitals/Pain Pain Assessment Pain Assessment: No/denies pain    Home Living Family/patient expects to be discharged to:: Private residence Living Arrangements: Alone Available Help at Discharge: Friend(s);Available PRN/intermittently Type of Home: House Home Access: Level entry     Alternate Level Stairs-Number of Steps: flight Home Layout: Two level;Able to live on main level with bedroom/bathroom Home Equipment: Rollator (4 wheels) (home O2, 2L (uses PRN), bipap)      Prior Function Prior Level of Function : Independent/Modified Independent;Driving             Mobility Comments: ambulates community distances without AD, drives ADLs Comments: independent     Hand Dominance   Dominant Hand: Right    Extremity/Trunk Assessment   Upper Extremity Assessment Upper Extremity Assessment: Overall WFL for tasks assessed    Lower Extremity Assessment Lower Extremity Assessment: Overall WFL for tasks assessed    Cervical / Trunk Assessment Cervical / Trunk Assessment: Kyphotic  Communication   Communication: No difficulties  Cognition Arousal/Alertness: Awake/alert Behavior During Therapy: Agitated (reports she is mean when she takes steroids) Overall Cognitive Status: Within Functional Limits for tasks assessed  General Comments General comments (skin integrity, edema, etc.): SPO2 >88%O2 with ambulation on RA, HR max noted 100bpm, BP 107/61        Assessment/Plan    PT Assessment Patient does not need any further PT services         PT Goals (Current goals can be found in the  Care Plan section)  Acute Rehab PT Goals PT Goal Formulation: All assessment and education complete, DC therapy     AM-PAC PT "6 Clicks" Mobility  Outcome Measure Help needed turning from your back to your side while in a flat bed without using bedrails?: None Help needed moving from lying on your back to sitting on the side of a flat bed without using bedrails?: None Help needed moving to and from a bed to a chair (including a wheelchair)?: None Help needed standing up from a chair using your arms (e.g., wheelchair or bedside chair)?: None Help needed to walk in hospital room?: None Help needed climbing 3-5 steps with a railing? : None 6 Click Score: 24    End of Session Equipment Utilized During Treatment: Gait belt Activity Tolerance: Patient tolerated treatment well Patient left: in bed;with call bell/phone within reach;with bed alarm set Nurse Communication: Mobility status PT Visit Diagnosis: Muscle weakness (generalized) (M62.81)    Time: [id number] PT Time Calculation (min) (ACUTE ONLY): 15 min   Charges:   PT Evaluation $PT Eval Moderate Complexity: 1 Mod          Laster Appling B. Migdalia Dk PT, DPT Acute Rehabilitation Services Please use secure chat or  Call Office [phone]   [place] 03/03/2023, 10:02 AM

## 2023-03-07 ENCOUNTER — Encounter (HOSPITAL_COMMUNITY): Payer: Self-pay | Admitting: Internal Medicine

## 2023-03-07 ENCOUNTER — Ambulatory Visit (HOSPITAL_COMMUNITY)
Admission: RE | Admit: 2023-03-07 | Discharge: 2023-03-07 | Disposition: A | Discharge status: Home / Self Care | Payer: BC Managed Care – PPO | Source: Ambulatory Visit | Attending: Internal Medicine | Admitting: Internal Medicine

## 2023-03-07 VITALS — BP 104/60 | HR 87 | Wt 102.2 lb

## 2023-03-07 DIAGNOSIS — I2510 Atherosclerotic heart disease of native coronary artery without angina pectoris: Secondary | ICD-10-CM | POA: Diagnosis not present

## 2023-03-07 DIAGNOSIS — J441 Chronic obstructive pulmonary disease with (acute) exacerbation: Secondary | ICD-10-CM | POA: Diagnosis not present

## 2023-03-07 DIAGNOSIS — J90 Pleural effusion, not elsewhere classified: Secondary | ICD-10-CM | POA: Diagnosis not present

## 2023-03-07 DIAGNOSIS — J449 Chronic obstructive pulmonary disease, unspecified: Secondary | ICD-10-CM

## 2023-03-07 DIAGNOSIS — Z951 Presence of aortocoronary bypass graft: Secondary | ICD-10-CM | POA: Insufficient documentation

## 2023-03-07 DIAGNOSIS — I5022 Chronic systolic (congestive) heart failure: Secondary | ICD-10-CM | POA: Diagnosis present

## 2023-03-07 DIAGNOSIS — J9611 Chronic respiratory failure with hypoxia: Secondary | ICD-10-CM | POA: Diagnosis not present

## 2023-03-07 DIAGNOSIS — I480 Paroxysmal atrial fibrillation: Secondary | ICD-10-CM | POA: Diagnosis not present

## 2023-03-07 DIAGNOSIS — J440 Chronic obstructive pulmonary disease with acute lower respiratory infection: Secondary | ICD-10-CM | POA: Diagnosis not present

## 2023-03-07 DIAGNOSIS — I110 Hypertensive heart disease with heart failure: Secondary | ICD-10-CM | POA: Diagnosis not present

## 2023-03-07 DIAGNOSIS — Z87891 Personal history of nicotine dependence: Secondary | ICD-10-CM | POA: Insufficient documentation

## 2023-03-07 DIAGNOSIS — Z79899 Other long term (current) drug therapy: Secondary | ICD-10-CM | POA: Insufficient documentation

## 2023-03-07 DIAGNOSIS — Z7901 Long term (current) use of anticoagulants: Secondary | ICD-10-CM | POA: Insufficient documentation

## 2023-03-07 DIAGNOSIS — Z7984 Long term (current) use of oral hypoglycemic drugs: Secondary | ICD-10-CM | POA: Diagnosis not present

## 2023-03-07 DIAGNOSIS — I252 Old myocardial infarction: Secondary | ICD-10-CM | POA: Insufficient documentation

## 2023-03-07 DIAGNOSIS — L905 Scar conditions and fibrosis of skin: Secondary | ICD-10-CM | POA: Insufficient documentation

## 2023-03-07 LAB — BASIC METABOLIC PANEL
Anion gap: 13 (ref 5–15)
BUN: 34 mg/dL — ABNORMAL HIGH (ref 8–23)
CO2: 25 mmol/L (ref 22–32)
Calcium: 10 mg/dL (ref 8.9–10.3)
Chloride: 98 mmol/L (ref 98–111)
Creatinine, Ser: 1.29 mg/dL — ABNORMAL HIGH (ref 0.44–1.00)
GFR, Estimated: 47 mL/min — ABNORMAL LOW (ref >60)
Glucose, Bld: 252 mg/dL — ABNORMAL HIGH (ref 70–99)
Potassium: 4.5 mmol/L (ref 3.5–5.1)
Sodium: 136 mmol/L (ref 135–145)

## 2023-03-07 LAB — BRAIN NATRIURETIC PEPTIDE: B Natriuretic Peptide: 1563.4 pg/mL — ABNORMAL HIGH (ref 0.0–100.0)

## 2023-03-07 MED ORDER — METOLAZONE 2.5 MG PO TABS: 2.5000 mg | ORAL_TABLET | ORAL | 1 refills | Status: DC

## 2023-03-07 NOTE — Progress Notes (Signed)
ReDS Vest / Clip - 03/07/23 1500       ReDS Vest / Clip   Station Marker A    Ruler Value 23    ReDS Value Range High volume overload    ReDS Actual Value 42

## 2023-03-07 NOTE — Progress Notes (Signed)
Advanced Heart Failure Clinic Note   PCP:  No primary care provider on file.  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 Underwent emergent cath 02/03/18 showed LAD 100% stenosed, LCx 95% stenosed. Taken for emergent CABG 02/03/18. Required impella post op. Hospital course complicated by cardiogenic shock, HCAP, A fib, respiratory failure, and swallowing issues. She was discharged to SNF. Discharge weight 103 pounds.    In 2019 had multiple hospitalizations for HF and pleural effusion. Underwent pleurodesis at [facility].   Admitted 3/20 with NSTEMI and HF. Received DES to ostial ramus into distal left main based on cath below and underwent diuresis. Meds adjusted as tolerated. Echo with EF 25-30%.   Admitted 11/21 with COPD flare with mild HF and hemoptysis. CTA showed stable scarring in the right hilum consistent with emphysema.. Underwent bronchoscopy on 11/15, no obvious source of bleeding found on examination.   Echo 11/21 EF 20-25%. RV mildly HK.   Admitted 3/22 Echo with EF < 20%. Treated for AECOPD exacerbation.   Admitted 12/22 with R renal infarct felt to be cardio-embolic (no AF found). Started on Eliquis  Admitted 8/23 w/ a/c respiratory failure and CHF. Concern for low output vs PNA vs COPD exacerbation. PICC placed and milrinone started. Diuresed w/ IV Lasix. Given prednisone taper and doxy for COPD exacerbation. Echo showed EF 20-25%, global hypokinesis, GIIDD, RV systolic function, mildly reduced. RV size normal. Mild MR, trivial TR, aortic valve w/ mild-mod regurg.  Barostim placed.   Admitted 12/23  with COPDE, a/c CHF and influenza +. Placed on BiPap, treated with tamiflu and diuresed with IV lasix. Empirically started milrinone over concern for low output  Here for f/u. Admitted earlier in the week for recurrent COPD flare in setting of pollen. Now on steroids Still SOB. Using nebulizers. Denies CP or LE edema. Has been losing weight. On torsemide 80 daily   Cardidac Studies: - Echo (7/23): EF 20-25%, global hypokinesis, GIIDD, RV systolic function, mildly reduced. RV size normal. Mild MR, trivial TR, aortic valve w/ mild-mod regurg  - Echo 1/23: EF 20-25%, severely reduced LV with global HK, grade II DD, normal RV  - Echo 02/10/21: EF <20%, G2DD  - Echo 2022: EF < 20% RV normal   - Echo 2021: EF 20-25% RV mildly reduced.   - Echo 02/12/19: EF 25-30%, RV mildly reduced  - 05/18/2021 PFT's: moderately severe airway obstruction and a diffusion defect suggesting emphysema.   - LHC 02/12/19: Ost LAD to Prox LAD lesion is 100% stenosed. Ost Cx to Prox Cx lesion is 100% stenosed. Ost Ramus lesion is 99% stenosed. Post intervention, there is a 0% residual stenosis. A drug-eluting stent was successfully placed using a [place] [id number]. SVG and is normal in caliber. The graft exhibits no disease. Ost 1st Diag lesion is 70% stenosed. SVG and is normal in caliber. Prox Graft lesion is 100% stenosed. 1.  Significant underlying two-vessel coronary artery disease with patent SVG to OM and SVG to diagonal which supplies the LAD territory.  Atretic LIMA to LAD given competitive flow from SVG to diagonal.  Severe ostial  ramus artery stenosis not supplied by bypass with his area suggestive of plaque rupture. 2.  Severely elevated left ventricular end-diastolic pressure 34 mmHg.  Left ventricular angiography was not performed. 3.  Successful angioplasty and drug-eluting stent placement to the ostial ramus extending into the distal left main.  - RHC 2019  RA = 8 RV = 49/9 PA = 47/20  (32) PCW = 19 Fick cardiac output/index = 4.3/3.1 PVR = 3.0 wu AO sat = 93% PA sat = 62%, 64%   1. Minimally elevated left heart pressures 2. Mild PAH 3. Normal cardiac output  Past Medical History:  Diagnosis Date   Acute respiratory failure ([place]) Q000111Q   Acute systolic congestive heart failure ([place]) 02/03/2018   AICD (automatic cardioverter/defibrillator) present    Allergy    Anxiety    Asthma    DM2 (diabetes mellitus, type 2) ([place]) 10/20/2020   Hypertension    PAF (paroxysmal atrial fibrillation) ([place])    Presence of permanent cardiac pacemaker    Prophylactic measure 08/03/14-08/19/14   Prophyl. 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   LIMA to LAD, SVG to D1, SVG to OM1, EVH via right thigh with implantation of Impella LD LVAD via direct aortic approach   Small cell lung cancer ([place]) 03/16/2014   Past Surgical History:  Procedure Laterality Date   BRONCHIAL BRUSHINGS  10/25/2020   Procedure: BRONCHIAL BRUSHINGS;  Surgeon: Collene Gobble, MD;  Location: [facility] ENDOSCOPY;  Service: Pulmonary;;   BRONCHIAL NEEDLE ASPIRATION BIOPSY  10/25/2020   Procedure: BRONCHIAL NEEDLE ASPIRATION BIOPSIES;  Surgeon: Collene Gobble, MD;  Location: [place] ENDOSCOPY;  Service: Pulmonary;;   CARDIAC DEFIBRILLATOR PLACEMENT  08/15/2018   MDT Visia AF MRI VR ICD implanted by Dr Loel Lofty for primary prevention of sudden   CESAREAN SECTION     CORONARY ARTERY BYPASS GRAFT N/A 02/03/2018   Procedure: CORONARY ARTERY BYPASS GRAFTING (CABG);  Surgeon: Rexene Alberts, MD;  Location: [place];  Service: Open Heart Surgery;  Laterality: N/A;  Time 3 using left internal mammary artery and endoscopically harvested right saphenous vein   CORONARY BALLOON ANGIOPLASTY N/A 02/03/2018   Procedure: CORONARY BALLOON ANGIOPLASTY;  Surgeon: Martinique, Peter M, MD;  Location: [place] CV LAB;  Service: Cardiovascular;  Laterality: N/A;   CORONARY STENT INTERVENTION N/A 02/12/2019   Procedure:  CORONARY STENT INTERVENTION;  Surgeon: Wellington Hampshire, MD;  Location: [place] CV LAB;  Service: Cardiovascular;  Laterality: N/A;   CORONARY/GRAFT ACUTE MI REVASCULARIZATION N/A 02/03/2018   Procedure: Coronary/Graft Acute MI Revascularization;  Surgeon: Martinique, Peter M, MD;  Location: [place] CV LAB;  Service: Cardiovascular;  Laterality: N/A;   ENDOBRONCHIAL ULTRASOUND N/A 10/25/2020   Procedure: ENDOBRONCHIAL ULTRASOUND;  Surgeon: Collene Gobble, MD;  Location: [facility] ENDOSCOPY;  Service: Pulmonary;  Laterality: N/A;   FLEXIBLE BRONCHOSCOPY  10/25/2020   Procedure: FLEXIBLE BRONCHOSCOPY;  Surgeon: Collene Gobble, MD;  Location: [facility] ENDOSCOPY;  Service: Pulmonary;;   IABP INSERTION N/A 02/03/2018   Procedure: IABP Insertion;  Surgeon: Martinique, Peter M, MD;  Location: [place] CV LAB;  Service: Cardiovascular;  Laterality: N/A;   INTRAOPERATIVE TRANSESOPHAGEAL ECHOCARDIOGRAM N/A 02/03/2018   Procedure: INTRAOPERATIVE TRANSESOPHAGEAL ECHOCARDIOGRAM;  Surgeon: Rexene Alberts, MD;  Location: [place];  Service: Open Heart Surgery;  Laterality: N/A;   LEFT HEART CATH AND CORONARY ANGIOGRAPHY N/A 02/03/2018   Procedure: LEFT HEART CATH AND CORONARY ANGIOGRAPHY;  Surgeon: Martinique, Peter M, MD;  Location: [place]  CV LAB;  Service: Cardiovascular;  Laterality: N/A;   LEFT HEART CATH AND CORS/GRAFTS ANGIOGRAPHY N/A 02/12/2019   Procedure: LEFT HEART CATH AND CORS/GRAFTS ANGIOGRAPHY;  Surgeon: Wellington Hampshire, MD;  Location: [place] CV LAB;  Service: Cardiovascular;  Laterality: N/A;   MEDIASTINOSCOPY N/A 03/11/2014   Procedure: MEDIASTINOSCOPY;  Surgeon: Melrose Nakayama, MD;  Location: [place];  Service: Thoracic;  Laterality: N/A;   PLACEMENT OF IMPELLA LEFT VENTRICULAR ASSIST DEVICE  02/03/2018   Procedure: PLACEMENT OF [place] LEFT VENTRICULAR ASSIST DEVICE LD;  Surgeon: Rexene Alberts, MD;  Location: [place];  Service: Open Heart Surgery;;   REMOVAL OF [place] LEFT VENTRICULAR ASSIST  DEVICE N/A 02/08/2018   Procedure: REMOVAL OF [place] LEFT VENTRICULAR ASSIST DEVICE;  Surgeon: Rexene Alberts, MD;  Location: [place];  Service: Open Heart Surgery;  Laterality: N/A;   RIGHT HEART CATH N/A 02/03/2018   Procedure: RIGHT HEART CATH;  Surgeon: Martinique, Peter M, MD;  Location: [place] CV LAB;  Service: Cardiovascular;  Laterality: N/A;   RIGHT HEART CATH N/A 05/09/2018   Procedure: RIGHT HEART CATH;  Surgeon: Jolaine Artist, MD;  Location: [place] CV LAB;  Service: Cardiovascular;  Laterality: N/A;   TEE WITHOUT CARDIOVERSION N/A 02/08/2018   Procedure: TRANSESOPHAGEAL ECHOCARDIOGRAM (TEE);  Surgeon: Rexene Alberts, MD;  Location: [place];  Service: Open Heart Surgery;  Laterality: N/A;   TUBAL LIGATION     VIDEO BRONCHOSCOPY WITH ENDOBRONCHIAL ULTRASOUND N/A 03/11/2014   Procedure: VIDEO BRONCHOSCOPY WITH ENDOBRONCHIAL ULTRASOUND;  Surgeon: Melrose Nakayama, MD;  Location: [facility] OR;  Service: Thoracic;  Laterality: N/A;   Current Outpatient Medications  Medication Sig Dispense Refill   albuterol (VENTOLIN HFA) 108 (90 Base) MCG/ACT inhaler Inhale 2 puffs into the lungs every 6 (six) hours as needed for wheezing or shortness of breath. 8.5 g 1   apixaban (ELIQUIS) 5 MG TABS tablet Take 1 tablet (5 mg total) by mouth 2 (two) times daily. 60 tablet 1   atorvastatin (LIPITOR) 80 MG tablet Take 1 tablet (80 mg total) by mouth daily at 6 PM. 30 tablet 1   blood glucose meter kit and supplies KIT Dispense based on patient and insurance preference. Use up to four times daily as directed. 1 each 0   dapagliflozin propanediol (FARXIGA) 10 MG TABS tablet Take 1 tablet by mouth daily. 30 tablet 11   digoxin (LANOXIN) 0.125 MG tablet Take 1 tablet (0.125 mg total) by mouth daily. 30 tablet 5   ipratropium-albuterol (DUONEB) 0.5-2.5 (3) MG/3ML SOLN Take 3 mLs by nebulization every 4 (four) hours as needed (for shortness of breath). 90 mL 3   ivabradine (CORLANOR) 7.5 MG TABS tablet Take 1  tablet (7.5 mg total) by mouth 2 (two) times daily with a meal. TAKE 1 TABLET (7.5 MG TOTAL) BY MOUTH 2 (TWO) TIMES DAILY WITH A MEAL. 180 tablet 1   Multiple Vitamin (MULTIVITAMIN WITH MINERALS) TABS tablet Take 1 tablet by mouth daily.     naphazoline-pheniramine (VISINE) 0.025-0.3 % ophthalmic solution Place 1 drop into both eyes 4 (four) times daily as needed for eye irritation.     nitroGLYCERIN (NITROSTAT) 0.4 MG SL tablet Place 1 tablet (0.4 mg total) under the tongue every 5 (five) minutes as needed for chest pain. 90 tablet 5   Nutritional Supplements (ENSURE ORIGINAL) LIQD Take 2 Bottles by mouth daily.     potassium chloride (KLOR-CON M) 10 MEQ tablet Take 4 tablets (40 mEq total) by mouth  2 (two) times daily. Take extra 4 tablets (40 meq total) when you take PRN metolazone. 285 tablet 5   predniSONE (DELTASONE) 10 MG tablet Take 4 tabs for 3 days, then 3 tabs for 3 days, then 2 tabs for 3 days, then 1 tab for 3 days, then 1/2 tab for 4 days. 32 tablet 0   spironolactone (ALDACTONE) 25 MG tablet Take 1 tablet (25 mg total) by mouth daily. 45 tablet 3   torsemide (DEMADEX) 20 MG tablet Take 80 mg by mouth daily.     No current facility-administered medications for this encounter.    Allergies:   Codeine   Social History:  The patient  reports that she quit smoking about 8 years ago. Her smoking use included cigarettes. She has a 20.00 pack-year smoking history. She has never used smokeless tobacco. She reports current alcohol use of about 5.0 standard drinks of alcohol per week. She reports current drug use. Drug: Marijuana.   Family History:  The patient's family history includes Cancer in her maternal grandmother; Diabetes in her paternal grandmother; Heart attack in her mother; Heart disease in her mother; Hypertension in her maternal grandmother and mother.   ROS:  Please see the history of present illness.   All other systems are personally reviewed and negative.    Recent  Labs: 03/01/2023: ALT 18; B Natriuretic Peptide 1,397.9; Magnesium 2.1 03/02/2023: Hemoglobin 14.6; Platelets 198 03/03/2023: BUN 34; Creatinine, Ser 1.31; Potassium 3.8; Sodium 134  Personally reviewed   Wt Readings from Last 3 Encounters:  03/07/23 46.4 kg (102 lb 3.2 oz)  03/03/23 43.8 kg (96 lb 9 oz)  12/12/22 46.7 kg (103 lb)   BP 104/60   Pulse 87   Wt 46.4 kg (102 lb 3.2 oz)   SpO2 97%   BMI 20.64 kg/m   Physical Exam General:  Thin weak appearing. Mild wheeze HEENT: normal Neck: supple. no JVD. Carotids 2+ bilat; no bruits. No lymphadenopathy or thryomegaly appreciated. Cor: PMI nondisplaced. Regular rate & rhythm. No rubs, gallops or murmurs. Lungs: markedly reduced. Mild wheeze Abdomen: soft, nontender, nondistended. No hepatosplenomegaly. No bruits or masses. Good bowel sounds. Extremities: no cyanosis, clubbing, rash, edema Neuro: alert & orientedx3, cranial nerves grossly intact. moves all 4 extremities w/o difficulty. Affect pleasant   Device interrogation (personally reviewed): OptiVol up. No VT or AF   ReDS: 42%   Assessment & Plan:  1. Chronic Systolic Heart Failure, due to ICM  - Echo 09/04/18 ([facility]) LVEF 20%, Moderate AI, Mild MR, Mild TR, Severe LAE, RV mildly decreased.  - Echo 02/12/19: EF 25-30%, RV mildly reduced - Echo 11/21 EF 20-25% RV mild HK. - Echo 2022 EF < 20% RV normal - Echo 1/23 EF 20-25% RV ok  - Has Medtronic single chamber ICD  - Echo 07/10/22: EF 20-25%, global hypokinesis, GIIDD, RV systolic function, mildly reduced. RV size normal. Mild MR, trivial TR, aortic valve w/ mild-mod regurg.  - Worse today in setting of COPD and volme flare with steroids - NYHA III-IIIB - Volume up ReDS 42% - Continue torsemide 80 mg daily, use metolazone 2.5 mg today and Friday (KCL 40 with each dose). See back next week - Continue digoxin 0.125 mg daily. Dig level 0.6 11/24/22 - Continue Farxiga 10 mg daily.  - Continue spiro 25 mg daily. - Continue  Corlanor 7.5 mg bid. - Failed Entresto due to hyperkalemia.   - No b-blocker due to intolerance with low output and bad COPD with frequent flares. -  Not a candidate for advanced therapies with her degree of lung disease.   - Now with Barostim in place.     2. Chronic Hypoxic Respiratory Failure with AECOPD - Multifactorial, former smoker, h/o small cell lung cancer treated with chemo, chest XRT, COPD and HF - She has home O2 ([employer]pt) & BiPap. - Continue steroids and nebs - Refer to Pulmonary   3. CAD  - Hx of NSTEMI/STEMI s/p Emergent CABG 02/03/18: Cath with severe 2v CAD as above with severe LV dysfunction/ICM.  - s/p Emergent CABG 02/03/18.   - Had NSTEMI and now s/p LHC 02/12/19 with DES to the ostial ramus extending into the distal left main.  - No longer on brillinta or ASA due to bleeding, and need for [facility]. - No s/s angina  - Continue hi-intensity statin.   4. COPD w/ current exacerbation - Recent exacerbation with + influenza a.  - Plan as above   5. Recurrent pleural effusions s/p pleurodesis - Resolved. Recent CXR ok   6. PAF - CHA2DS2/VASc is at least 4. (CHF, Vasc disease, HTN, Female).   - She has history of Afib RVR in the past in the setting of her Lung CA.  - Previously on Xarelto but stopped due to hemoptysis.  - Started on Eliquis in 12/22 due to renal infarct. - In NSR today - Continue Eliquis 2.5 mg bid. No bleeding issues. - CBC today.   7. 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 - Remains on Eliquis.     8. H/o SCLC:  - s/p treatment 2015. Lost to f/u since 04/2015.  - Chest CT 02/19/18 with mass-like consolidation in R hilum concerning for recurrent tumor.  - Repeat CT 03/27/2018 -No definite findings of locally recurrent tumor in the right lung. 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 Continued chest CT surveillance is advised in 3-6 months. - S/p thoracentesis  04/2018. Cytology negative for malignancy.  - CTA 11/19 stable scarring in the right hilum consistent with emphysema. Bronchoscopy on 11/15, no obvious source of bleeding  Glori Bickers, MD  2:38 PM

## 2023-03-07 NOTE — Patient Instructions (Signed)
TAKE Metolazone 2.5 mg today and Friday. Please take an extra 40 tablets of potassium these 2 days.  Labs done today, your results will be available in MyChart, we will contact you for abnormal readings.  Your physician recommends that you schedule a follow-up appointment in: next Thursday  If you have any questions or concerns before your next appointment please send [place] a message through [place] or call our office at [phone].    TO LEAVE A MESSAGE FOR THE NURSE SELECT OPTION 2, PLEASE LEAVE A MESSAGE INCLUDING: YOUR NAME DATE OF BIRTH CALL BACK NUMBER REASON FOR CALL**this is important as we prioritize the call backs  YOU WILL RECEIVE A CALL BACK THE SAME DAY AS LONG AS YOU CALL BEFORE 4:00 PM  At the [facility], you and your health needs are our priority. As part of our continuing mission to provide you with exceptional heart care, we have created designated Provider Care Teams. These Care Teams include your primary Cardiologist (physician) and Advanced Practice Providers (APPs- Physician Assistants and Nurse Practitioners) who all work together to provide you with the care you need, when you need it.   You may see any of the following providers on your designated Care Team at your next follow up: Dr Glori Bickers Dr Loralie Champagne Dr. Roxana Hires, NP Lyda Jester, [place] [facility] Carpenter, [place] Forestine Na, NP Audry Riles, PharmD   Please be sure to bring in all your medications bottles to every appointment.    Thank you for choosing [facility]

## 2023-03-12 ENCOUNTER — Telehealth: Payer: Self-pay

## 2023-03-12 NOTE — Telephone Encounter (Signed)
Returned call to patient as requested by voice mail message.  She stated she was hospitalized for pulmonary problems on 3/21.  She has been referred to pulmonologist.   She sent remote transmission and wanted to have it reviewed to for fluid.  She reports Dr [name] had her take Metolazone after 3/27 OV appointment. She has HF clinic follow up appointment on 4/4.    Next ICM remote transmission scheduled for 4/8.  03/12/2023 Optivol thoracic impedance suggesting possible fluid accumulation from 3/7-3/27 but returned to normal after taking one time dose of Metolazone.

## 2023-03-14 NOTE — Progress Notes (Signed)
Advanced Heart Failure Clinic Note   PCP:  No primary care provider on file.  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 Underwent emergent cath 02/03/18 showed LAD 100% stenosed, LCx 95% stenosed. Taken for emergent CABG 02/03/18. Required impella post op. Hospital course complicated by cardiogenic shock, HCAP, A fib, respiratory failure, and swallowing issues. She was discharged to SNF. Discharge weight 103 pounds.    In 2019 had multiple hospitalizations for HF and pleural effusion. Underwent pleurodesis at [facility].   Admitted 3/20 with NSTEMI and HF. Received DES to ostial ramus into distal left main based on cath below and underwent diuresis. Meds adjusted as tolerated. Echo with EF 25-30%.   Admitted 11/21 with COPD flare with mild HF and hemoptysis. CTA showed stable scarring in the right hilum consistent with emphysema.. Underwent bronchoscopy on 11/15, no obvious source of bleeding found on examination.   Echo 11/21 EF 20-25%. RV mildly HK.   Admitted 3/22 Echo with EF < 20%. Treated for AECOPD exacerbation.   Admitted 12/22 with R renal infarct felt to be cardio-embolic (no AF found). Started on Eliquis  Admitted 8/23 w/ a/c respiratory failure and CHF. Concern for low output vs PNA vs COPD exacerbation. PICC placed and milrinone started. Diuresed w/ IV Lasix. Given prednisone taper and doxy for COPD exacerbation. Echo showed EF 20-25%, global hypokinesis, GIIDD, RV systolic function, mildly reduced. RV size normal. Mild MR, trivial TR, aortic valve w/ mild-mod regurg.  Barostim placed.   Admitted 12/23  with COPDE, a/c CHF and influenza +. Placed on BiPap, treated with tamiflu and diuresed with IV lasix. Empirically started milrinone over concern for low output  She was seen last week in the HF clinic. Volume overloaded. Instructed to take torsemide 80 mg daily and take metolazone 3/27 and 03/09/23.  Today she returns for HF follow up.Overall feeling better. Wants to come off prednisone due to uncontrolled glucose.  Complaining of weakness. Remains a little SOB with exertion but says this is her baseline.  Denies PND/Orthopnea. Using oxygen as needed. Not using Bipap. Appetite ok. No fever or chills. Weight at home 97  pounds. Taking all medications. Needs help with household cleaning. Lives alone.   Cardidac Studies: - Echo (7/23): EF 20-25%, global hypokinesis, GIIDD, RV systolic function, mildly reduced. RV size normal. Mild MR, trivial TR, aortic valve w/ mild-mod regurg  - Echo 1/23: EF 20-25%, severely reduced LV with global HK, grade II DD, normal RV  - Echo 02/10/21: EF <20%, G2DD  - Echo 2022: EF < 20% RV normal   - Echo 2021: EF 20-25% RV mildly reduced.   - Echo 02/12/19: EF 25-30%, RV mildly reduced  - 05/18/2021 PFT's: moderately severe airway obstruction and a diffusion defect suggesting emphysema.   - LHC 02/12/19: Ost LAD to Prox LAD lesion is 100% stenosed. Ost Cx to Prox Cx lesion is 100% stenosed. Ost Ramus lesion is 99% stenosed. Post intervention, there is a 0% residual stenosis. A drug-eluting stent was successfully placed using a [place] [id number]. SVG and is normal in caliber. The graft exhibits no disease. Colon Flattery  1st Diag lesion is 70% stenosed. SVG and is normal in caliber. Prox Graft lesion is 100% stenosed. 1.  Significant underlying two-vessel coronary artery disease with patent SVG to OM and SVG to diagonal which supplies the LAD territory.  Atretic LIMA to LAD given competitive flow from SVG to diagonal.  Severe ostial ramus artery stenosis not supplied  by bypass with his area suggestive of plaque rupture. 2.  Severely elevated left ventricular end-diastolic pressure 34 mmHg.  Left ventricular angiography was not performed. 3.  Successful angioplasty and drug-eluting stent placement to the ostial ramus extending into the distal left main.  - RHC 2019  RA = 8 RV = 49/9 PA = 47/20 (32) PCW = 19 Fick cardiac output/index = 4.3/3.1 PVR = 3.0 wu AO sat = 93% PA sat = 62%, 64%   1. Minimally elevated left heart pressures 2. Mild PAH 3. Normal cardiac output  Past Medical History:  Diagnosis Date   Acute respiratory failure ([place]) Q000111Q   Acute systolic congestive heart failure ([place]) 02/03/2018   AICD (automatic cardioverter/defibrillator) present    Allergy    Anxiety    Asthma    DM2 (diabetes mellitus, type 2) ([place]) 10/20/2020   Hypertension    PAF (paroxysmal atrial fibrillation) ([place])    Presence of permanent cardiac pacemaker    Prophylactic measure 08/03/14-08/19/14   Prophyl. 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   LIMA to LAD, SVG to D1, SVG to OM1, EVH via right thigh with implantation of Impella LD LVAD via direct aortic approach   Small cell lung cancer ([place]) 03/16/2014   Past Surgical History:  Procedure Laterality Date   BRONCHIAL BRUSHINGS  10/25/2020   Procedure: BRONCHIAL BRUSHINGS;  Surgeon: Collene Gobble, MD;  Location: [facility] ENDOSCOPY;  Service: Pulmonary;;   BRONCHIAL NEEDLE ASPIRATION BIOPSY  10/25/2020   Procedure: BRONCHIAL NEEDLE ASPIRATION BIOPSIES;  Surgeon: Collene Gobble, MD;  Location: [facility] ENDOSCOPY;  Service: Pulmonary;;   CARDIAC DEFIBRILLATOR PLACEMENT  08/15/2018   MDT Visia AF MRI VR ICD implanted by Dr Loel Lofty for primary prevention of sudden   CESAREAN SECTION     CORONARY ARTERY BYPASS GRAFT N/A 02/03/2018   Procedure: CORONARY ARTERY BYPASS GRAFTING (CABG);  Surgeon: Rexene Alberts, MD;  Location: [place];  Service: Open Heart Surgery;  Laterality: N/A;  Time 3 using  left internal mammary artery and endoscopically harvested right saphenous vein   CORONARY BALLOON ANGIOPLASTY N/A 02/03/2018   Procedure: CORONARY BALLOON ANGIOPLASTY;  Surgeon: Martinique, Peter M, MD;  Location: [place] CV LAB;  Service: Cardiovascular;  Laterality: N/A;   CORONARY STENT INTERVENTION N/A 02/12/2019   Procedure: CORONARY STENT INTERVENTION;  Surgeon: Wellington Hampshire, MD;  Location: [place] CV LAB;  Service: Cardiovascular;  Laterality: N/A;   CORONARY/GRAFT ACUTE MI REVASCULARIZATION N/A 02/03/2018   Procedure: Coronary/Graft Acute MI Revascularization;  Surgeon: Martinique, Peter M, MD;  Location: [place] CV LAB;  Service: Cardiovascular;  Laterality: N/A;   ENDOBRONCHIAL ULTRASOUND N/A 10/25/2020   Procedure: ENDOBRONCHIAL ULTRASOUND;  Surgeon: Collene Gobble, MD;  Location[facility] ENDOSCOPY;  Service: Pulmonary;  Laterality: N/A;   FLEXIBLE BRONCHOSCOPY  10/25/2020   Procedure: FLEXIBLE BRONCHOSCOPY;  Surgeon: Collene Gobble, MD;  Location: [facility] ENDOSCOPY;  Service: Pulmonary;;   IABP INSERTION N/A 02/03/2018   Procedure: IABP Insertion;  Surgeon: Martinique, Peter M, MD;  Location: [place] CV LAB;  Service: Cardiovascular;  Laterality: N/A;   INTRAOPERATIVE TRANSESOPHAGEAL ECHOCARDIOGRAM  N/A 02/03/2018   Procedure: INTRAOPERATIVE TRANSESOPHAGEAL ECHOCARDIOGRAM;  Surgeon: Rexene Alberts, MD;  Location: [place];  Service: Open Heart Surgery;  Laterality: N/A;   LEFT HEART CATH AND CORONARY ANGIOGRAPHY N/A 02/03/2018   Procedure: LEFT HEART CATH AND CORONARY ANGIOGRAPHY;  Surgeon: Martinique, Peter M, MD;  Location: [place] CV LAB;  Service: Cardiovascular;  Laterality: N/A;   LEFT HEART CATH AND CORS/GRAFTS ANGIOGRAPHY N/A 02/12/2019   Procedure: LEFT HEART CATH AND CORS/GRAFTS ANGIOGRAPHY;  Surgeon: Wellington Hampshire, MD;  Location: [place] CV LAB;  Service: Cardiovascular;  Laterality: N/A;   MEDIASTINOSCOPY N/A 03/11/2014   Procedure: MEDIASTINOSCOPY;  Surgeon: Melrose Nakayama, MD;  Location: [place];  Service: Thoracic;  Laterality: N/A;   PLACEMENT OF IMPELLA LEFT VENTRICULAR ASSIST DEVICE  02/03/2018   Procedure: PLACEMENT OF [place] LEFT VENTRICULAR ASSIST DEVICE LD;  Surgeon: Rexene Alberts, MD;  Location: [place];  Service: Open Heart Surgery;;   REMOVAL OF [place] LEFT VENTRICULAR ASSIST DEVICE N/A 02/08/2018   Procedure: REMOVAL OF [place] LEFT VENTRICULAR ASSIST DEVICE;  Surgeon: Rexene Alberts, MD;  Location: [place];  Service: Open Heart Surgery;  Laterality: N/A;   RIGHT HEART CATH N/A 02/03/2018   Procedure: RIGHT HEART CATH;  Surgeon: Martinique, Peter M, MD;  Location: [place] CV LAB;  Service: Cardiovascular;  Laterality: N/A;   RIGHT HEART CATH N/A 05/09/2018   Procedure: RIGHT HEART CATH;  Surgeon: Jolaine Artist, MD;  Location: [place] CV LAB;  Service: Cardiovascular;  Laterality: N/A;   TEE WITHOUT CARDIOVERSION N/A 02/08/2018   Procedure: TRANSESOPHAGEAL ECHOCARDIOGRAM (TEE);  Surgeon: Rexene Alberts, MD;  Location: [place];  Service: Open Heart Surgery;  Laterality: N/A;   TUBAL LIGATION     VIDEO BRONCHOSCOPY WITH ENDOBRONCHIAL ULTRASOUND N/A 03/11/2014   Procedure: VIDEO BRONCHOSCOPY WITH ENDOBRONCHIAL ULTRASOUND;  Surgeon: Melrose Nakayama, MD;  Location: [facility] OR;  Service: Thoracic;  Laterality: N/A;   Current Outpatient Medications  Medication Sig Dispense Refill   albuterol (VENTOLIN HFA) 108 (90 Base) MCG/ACT inhaler Inhale 2 puffs into the lungs every 6 (six) hours as needed for wheezing or shortness of breath. 8.5 g 1   apixaban (ELIQUIS) 5 MG TABS tablet Take 1 tablet (5 mg total) by mouth 2 (two) times daily. 60 tablet 1   atorvastatin (LIPITOR) 80 MG tablet Take 1 tablet (80 mg total) by mouth daily at 6 PM. 30 tablet 1   blood glucose meter kit and supplies KIT Dispense based on patient and insurance preference. Use up to four times daily as directed. 1 each 0   dapagliflozin propanediol (FARXIGA) 10 MG TABS tablet Take  1 tablet by mouth daily. 30 tablet 11   digoxin (LANOXIN) 0.125 MG tablet Take 1 tablet (0.125 mg total) by mouth daily. 30 tablet 5   ipratropium-albuterol (DUONEB) 0.5-2.5 (3) MG/3ML SOLN Take 3 mLs by nebulization every 4 (four) hours as needed (for shortness of breath). 90 mL 3   ivabradine (CORLANOR) 7.5 MG TABS tablet Take 1 tablet (7.5 mg total) by mouth 2 (two) times daily with a meal. TAKE 1 TABLET (7.5 MG TOTAL) BY MOUTH 2 (TWO) TIMES DAILY WITH A MEAL. 180 tablet 1   metolazone (ZAROXOLYN) 2.5 MG tablet Take 1 tablet (2.5 mg total) by mouth as directed. 15 tablet 1   Multiple Vitamin (MULTIVITAMIN WITH MINERALS) TABS tablet Take 1 tablet by mouth daily.     naphazoline-pheniramine (VISINE) 0.025-0.3 % ophthalmic solution Place 1 drop  into both eyes 4 (four) times daily as needed for eye irritation.     nitroGLYCERIN (NITROSTAT) 0.4 MG SL tablet Place 1 tablet (0.4 mg total) under the tongue every 5 (five) minutes as needed for chest pain. 90 tablet 5   Nutritional Supplements (ENSURE ORIGINAL) LIQD Take 2 Bottles by mouth daily.     potassium chloride (KLOR-CON M) 10 MEQ tablet Take 4 tablets (40 mEq total) by mouth 2 (two) times daily. Take extra 4 tablets (40 meq total) when you take PRN metolazone. 285 tablet 5   predniSONE (DELTASONE) 10 MG tablet Take 4 tabs for 3 days, then 3 tabs for 3 days, then 2 tabs for 3 days, then 1 tab for 3 days, then 1/2 tab for 4 days. 32 tablet 0   spironolactone (ALDACTONE) 25 MG tablet Take 1 tablet (25 mg total) by mouth daily. 45 tablet 3   torsemide (DEMADEX) 20 MG tablet Take 80 mg by mouth daily.     No current facility-administered medications for this encounter.    Allergies:   Codeine   Social History:  The patient  reports that she quit smoking about 9 years ago. Her smoking use included cigarettes. She has a 20.00 pack-year smoking history. She has never used smokeless tobacco. She reports current alcohol use of about 5.0 standard drinks of  alcohol per week. She reports current drug use. Drug: Marijuana.   Family History:  The patient's family history includes Cancer in her maternal grandmother; Diabetes in her paternal grandmother; Heart attack in her mother; Heart disease in her mother; Hypertension in her maternal grandmother and mother.   ROS:  Please see the history of present illness.   All other systems are personally reviewed and negative.    Recent Labs: 03/01/2023: ALT 18; Magnesium 2.1 03/02/2023: Hemoglobin 14.6; Platelets 198 03/07/2023: B Natriuretic Peptide 1,563.4; BUN 34; Creatinine, Ser 1.29; Potassium 4.5; Sodium 136  Personally reviewed   Wt Readings from Last 3 Encounters:  03/15/23 45.8 kg (101 lb)  03/07/23 46.4 kg (102 lb 3.2 oz)  03/03/23 43.8 kg (96 lb 9 oz)   BP 110/70   Pulse (!) 104   Wt 45.8 kg (101 lb)   SpO2 98%   BMI 20.40 kg/m   Physical Exam General: No resp difficulty HEENT: normal Neck: supple. no JVD. Carotids 2+ bilat; no bruits. No lymphadenopathy or thryomegaly appreciated. Cor: PMI nondisplaced. Regular rate & rhythm. No rubs, gallops or murmurs. Lungs: clear No wheeze.  Abdomen: soft, nontender, nondistended. No hepatosplenomegaly. No bruits or masses. Good bowel sounds. Extremities: no cyanosis, clubbing, rash, edema Neuro: alert & orientedx3, cranial nerves grossly intact. moves all 4 extremities w/o difficulty. Affect pleasant    Device interrogation: OptiVol - Fluid index well below threshold. Impedance up.   ReDS: 35%   Assessment & Plan:  1. Chronic Systolic Heart Failure, due to ICM  - Echo 09/04/18 ([facility]) LVEF 20%, Moderate AI, Mild MR, Mild TR, Severe LAE, RV mildly decreased.  - Echo 02/12/19: EF 25-30%, RV mildly reduced - Echo 11/21 EF 20-25% RV mild HK. - Echo 2022 EF < 20% RV normal - Echo 1/23 EF 20-25% RV ok  - Has Medtronic single chamber ICD  - Echo 07/10/22: EF 20-25%, global hypokinesis, GIIDD, RV systolic function, mildly reduced. RV size normal.  Mild MR, trivial TR, aortic valve w/ mild-mod regurg.  -- NYHA III-IIIB - Reds Clip 35%. Volume status much improved.  Continue torsemide 80 mg daily.  - Continue digoxin  0.125 mg daily. Dig level 0.6 11/24/22 - Continue Farxiga 10 mg daily.  - Continue spiro 25 mg daily. - Continue Corlanor 7.5 mg bid. - Failed Entresto due to hyperkalemia.   - No b-blocker due to intolerance with low output and bad COPD with frequent flares. - Not a candidate for advanced therapies with her degree of lung disease.   - Now with Barostim in place.  - Check BMET    2. Chronic Hypoxic Respiratory Failure with COPD - Multifactorial, former smoker, h/o small cell lung cancer treated with chemo, chest XRT, COPD and HF - She has home O2 (Adapt) & BiPap. - Continue steroids and nebs - Will refer to Pulmonary.   3. CAD  - Hx of NSTEMI/STEMI s/p Emergent CABG 02/03/18: Cath with severe 2v CAD as above with severe LV dysfunction/ICM.  - s/p Emergent CABG 02/03/18.   - Had NSTEMI and now s/p LHC 02/12/19 with DES to the ostial ramus extending into the distal left main.  - No longer on brillinta or ASA due to bleeding, and need for [facility]. - No chest pain.  - Continue hi-intensity statin.   4. COPD w/ current exacerbation - Recent exacerbation with + influenza a.  - On steroid taper.  - Refer to Pulmonary as noted above.   5. Recurrent pleural effusions s/p pleurodesis - Resolved. Recent CXR ok   6. PAF - CHA2DS2/VASc is at least 4. (CHF, Vasc disease, HTN, Female).   - She has history of Afib RVR in the past in the setting of her Lung CA.  - Previously on Xarelto but stopped due to hemoptysis.  - Started on Eliquis in 12/22 due to renal infarct. - In SR today.  - Continue Eliquis 2.5 mg bid. No bleeding issues.  7. 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 - Remains on Eliquis.     8. H/o SCLC:  - s/p treatment 2015. Lost to f/u since 04/2015.  - Chest CT 02/19/18 with mass-like  consolidation in R hilum concerning for recurrent tumor.  - Repeat CT 03/27/2018 -No definite findings of locally recurrent tumor in the right lung. 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 Continued chest CT surveillance is advised in 3-6 months. - S/p thoracentesis 04/2018. Cytology negative for malignancy.  - CTA 11/19 stable scarring in the right hilum consistent with emphysema. Bronchoscopy on 11/15, no obvious source of bleeding  Discussed Optvol/EKG/Reds. Follow up in 4 weeks to reassess volume status. I completed application for Personal Care Services.  Refer to Pulmonolgy, HF Paramedicine, and PCP.   Darrick Grinder, NP  10:28 AM

## 2023-03-15 ENCOUNTER — Ambulatory Visit (HOSPITAL_COMMUNITY)
Admission: RE | Admit: 2023-03-15 | Discharge: 2023-03-15 | Disposition: A | Discharge status: Home / Self Care | Payer: BC Managed Care – PPO | Source: Ambulatory Visit | Attending: Adult Health | Admitting: Adult Health

## 2023-03-15 ENCOUNTER — Telehealth (HOSPITAL_COMMUNITY): Payer: Self-pay

## 2023-03-15 VITALS — BP 110/70 | HR 104 | Wt 101.0 lb

## 2023-03-15 DIAGNOSIS — F1290 Cannabis use, unspecified, uncomplicated: Secondary | ICD-10-CM | POA: Insufficient documentation

## 2023-03-15 DIAGNOSIS — I252 Old myocardial infarction: Secondary | ICD-10-CM | POA: Insufficient documentation

## 2023-03-15 DIAGNOSIS — Z8249 Family history of ischemic heart disease and other diseases of the circulatory system: Secondary | ICD-10-CM | POA: Diagnosis not present

## 2023-03-15 DIAGNOSIS — I110 Hypertensive heart disease with heart failure: Secondary | ICD-10-CM | POA: Insufficient documentation

## 2023-03-15 DIAGNOSIS — J9611 Chronic respiratory failure with hypoxia: Secondary | ICD-10-CM

## 2023-03-15 DIAGNOSIS — I454 Nonspecific intraventricular block: Secondary | ICD-10-CM | POA: Insufficient documentation

## 2023-03-15 DIAGNOSIS — I5022 Chronic systolic (congestive) heart failure: Secondary | ICD-10-CM | POA: Diagnosis not present

## 2023-03-15 DIAGNOSIS — I2510 Atherosclerotic heart disease of native coronary artery without angina pectoris: Secondary | ICD-10-CM | POA: Diagnosis not present

## 2023-03-15 DIAGNOSIS — Z923 Personal history of irradiation: Secondary | ICD-10-CM | POA: Diagnosis not present

## 2023-03-15 DIAGNOSIS — J440 Chronic obstructive pulmonary disease with acute lower respiratory infection: Secondary | ICD-10-CM | POA: Insufficient documentation

## 2023-03-15 DIAGNOSIS — Z951 Presence of aortocoronary bypass graft: Secondary | ICD-10-CM | POA: Insufficient documentation

## 2023-03-15 DIAGNOSIS — I255 Ischemic cardiomyopathy: Secondary | ICD-10-CM | POA: Insufficient documentation

## 2023-03-15 DIAGNOSIS — Z79899 Other long term (current) drug therapy: Secondary | ICD-10-CM | POA: Insufficient documentation

## 2023-03-15 DIAGNOSIS — Z85118 Personal history of other malignant neoplasm of bronchus and lung: Secondary | ICD-10-CM | POA: Insufficient documentation

## 2023-03-15 DIAGNOSIS — J441 Chronic obstructive pulmonary disease with (acute) exacerbation: Secondary | ICD-10-CM | POA: Diagnosis not present

## 2023-03-15 DIAGNOSIS — Z7901 Long term (current) use of anticoagulants: Secondary | ICD-10-CM | POA: Insufficient documentation

## 2023-03-15 DIAGNOSIS — J90 Pleural effusion, not elsewhere classified: Secondary | ICD-10-CM | POA: Insufficient documentation

## 2023-03-15 DIAGNOSIS — I480 Paroxysmal atrial fibrillation: Secondary | ICD-10-CM | POA: Insufficient documentation

## 2023-03-15 DIAGNOSIS — Z9221 Personal history of antineoplastic chemotherapy: Secondary | ICD-10-CM | POA: Diagnosis not present

## 2023-03-15 DIAGNOSIS — Z87891 Personal history of nicotine dependence: Secondary | ICD-10-CM | POA: Insufficient documentation

## 2023-03-15 LAB — BASIC METABOLIC PANEL
Anion gap: 12 (ref 5–15)
BUN: 36 mg/dL — ABNORMAL HIGH (ref 8–23)
CO2: 28 mmol/L (ref 22–32)
Calcium: 9.9 mg/dL (ref 8.9–10.3)
Chloride: 100 mmol/L (ref 98–111)
Creatinine, Ser: 1.14 mg/dL — ABNORMAL HIGH (ref 0.44–1.00)
GFR, Estimated: 54 mL/min — ABNORMAL LOW (ref >60)
Glucose, Bld: 142 mg/dL — ABNORMAL HIGH (ref 70–99)
Potassium: 3.3 mmol/L — ABNORMAL LOW (ref 3.5–5.1)
Sodium: 140 mmol/L (ref 135–145)

## 2023-03-15 NOTE — Telephone Encounter (Signed)
Patient aware of lab results. She verbalized understanding of medication change for today.

## 2023-03-15 NOTE — Progress Notes (Signed)
ReDS Vest / Clip - 03/15/23 1000       ReDS Vest / Clip   Station Marker A    Ruler Value 21    ReDS Value Range Low volume    ReDS Actual Value 35

## 2023-03-15 NOTE — Progress Notes (Signed)
H&V Care Navigation CSW Progress Note  Clinical Social Worker met with patient to discuss paramedicine.  Patient is participating in a Managed Medicaid Plan:  no  Patient reports she has been on Paramedicine in the past and is open to having the program again. She states that sometimes she is hesitant to call when she is not feeling well and hopeful that the paramedics could assist her in identifying early symptoms to avoid hospitalization. Patient agreeable to referral and verbalizes understanding of program. CSW will follow up with Paramedicine Team for referral. Alexis Frock, [place]: No Food Insecurity (03/01/2023)  Housing: Low Risk  (03/01/2023)  Transportation Needs: Patient Declined (03/01/2023)  Utilities: Not At Risk (03/01/2023)  Alcohol Screen: Low Risk  (02/11/2021)  Depression (PHQ2-9): Low Risk  (02/11/2021)  Financial Resource Strain: Medium Risk (02/11/2021)  Physical Activity: Insufficiently Active (02/11/2021)  Social Connections: Unknown (02/11/2021)  Stress: Stress Concern Present (02/11/2021)  Tobacco Use: Medium Risk (03/07/2023)

## 2023-03-15 NOTE — Patient Instructions (Signed)
Medication Changes:  No medication changes this visit.  Lab Work:  Labs done today, your results will be available in MyChart, we will contact you for abnormal readings.   Testing/Procedures:  N/A  Referrals:  Referral to Pulmonology. They will contact you directly to set up an appointment. Primary Care Physician list provided. You can call and set up appointment with physician you choose.  Special Instructions // Education:  N/A  Follow-Up in: Appointment scheduled 04/16/23 @ 2:30pm for your follow up visit.  At the [facility], you and your health needs are our priority. We have a designated team specialized in the treatment of Heart Failure. This Care Team includes your primary Heart Failure Specialized Cardiologist (physician), Advanced Practice Providers (APPs- Physician Assistants and Nurse Practitioners), and Pharmacist who all work together to provide you with the care you need, when you need it.   You may see any of the following providers on your designated Care Team at your next follow up:  Dr. Glori Bickers Dr. Loralie Champagne Dr. Roxana Hires, NP Lyda Jester, [place] [facility] [place], [place] Forestine Na, NP Audry Riles, PharmD   Please be sure to bring in all your medications bottles to every appointment.   Need to Contact us:  If you have any questions or concerns before your next appointment please send [place] a message through [place] or call our office at [phone].    TO LEAVE A MESSAGE FOR THE NURSE SELECT OPTION 2, PLEASE LEAVE A MESSAGE INCLUDING: YOUR NAME DATE OF BIRTH CALL BACK NUMBER REASON FOR CALL**this is important as we prioritize the call backs  YOU WILL RECEIVE A CALL BACK THE SAME DAY AS LONG AS YOU CALL BEFORE 4:00 PM

## 2023-03-16 ENCOUNTER — Telehealth (HOSPITAL_COMMUNITY): Payer: Self-pay | Admitting: Licensed Clinical Social Worker

## 2023-03-16 NOTE — Progress Notes (Signed)
Heart and Vascular Care Navigation  03/16/2023  Brittany [name] October 19, 1961 [id number]  Reason for Referral: paramedicine referral   Engaged with patient by telephone for initial visit for Heart and Vascular Care Coordination.                                                                                                   Assessment:   CSW spoke with pt regarding Leisure centre manager.  Pt has been on paramedicine before so is aware of what it offers and is agreeable to enrollment.  Paramedicine Initial Assessment:  Housing:  In what kind of housing do you live? House/apt/trailer/shelter? Currently living in a hotel.  Address on file is for her sister and is the best place to mail her things.  She chooses to stay in a hotel and does not want to move into an apartment at this time as she is worried about affordability.  States she can stay with sister or her boyfriend if she needs but prefers to stay alone.  CSW emailed list of senior affordable housing and encouraged her to look into applying as there is often a waitlist.  Do you live with anyone? self  Are you currently worried about losing your housing? no  Social:  What is your current marital status? Married but separated for 12 years.  Has a boyfriend now.  Do you have any children? Son who lives in [place]   Income:  What is your current source of income? SSDI- $31,599   Insurance:  Are you currently insured? BCBS through her spouses plan, Medicare, and Medicaid  Do you have prescription coverage? yes  Transportation:  Do you have transportation to your medical appointments? Yes- has a car but doesn't like to drive due to medical concerns- always has a friend to drive her when need be.   Daily Health Needs: Do you have a working scale at home? yes  Do you have issues affording your medications? no  Do you have any concerns with mobility at home? Has a rollator she uses as needed.  Do you use any  assistive devices at home or have PCS at home?no- per notes application for services started  Do you have any trouble reading or writing? no  Do you currently see any mental health providers? Not assessed  Are there any additional barriers you see to getting the care you need? Not at this time.                                    HRT/VAS Care Coordination     Patients Home Cardiology Office Heart Failure Clinic   Outpatient Care Team Social Worker; Community Paramedicine   Living arrangements for the past 2 months Hotel/Motel   Lives with: Self   Patient Current Optometrist; [place]; Traditional Medicare   Patient Has Concern With Paying Medical Bills No   Does Patient Have Prescription Coverage? Yes   Home Assistive Devices/Equipment Walker (specify type)   DME Agency [employer]  [facility] Agency [facility]   Current home services DME  Rollator, oxygen ([employer]), scale       Social History:                                                                             SDOH Screenings   Food Insecurity: No Food Insecurity (03/01/2023)  Housing: Low Risk  (03/01/2023)  Transportation Needs: Patient Declined (03/01/2023)  Utilities: Not At Risk (03/01/2023)  Alcohol Screen: Low Risk  (02/11/2021)  Depression (PHQ2-9): Low Risk  (02/11/2021)  Financial Resource Strain: Medium Risk (02/11/2021)  Physical Activity: Insufficiently Active (02/11/2021)  Social Connections: Unknown (02/11/2021)  Stress: Stress Concern Present (02/11/2021)  Tobacco Use: Medium Risk (03/07/2023)    SDOH Interventions: Financial Resources:    SSDI  Food Insecurity:  None- gets food stamps but only $23/month  Housing Insecurity:  None- lives in hotel but likes it there and lives there by choice  Transportation:   Has car but doesn't like to drive with medical concerns- has family or friends drive    Follow-up plan:     CSW sent out to paramedics for assignment and follow  up.  Burna Sis, LCSW Clinical Social Worker Advanced Heart Failure Clinic Desk#: [phone] Cell#: [phone]

## 2023-03-19 ENCOUNTER — Ambulatory Visit: Payer: BC Managed Care – PPO | Attending: Cardiology

## 2023-03-19 DIAGNOSIS — Z95810 Presence of automatic (implantable) cardiac defibrillator: Secondary | ICD-10-CM | POA: Diagnosis not present

## 2023-03-19 DIAGNOSIS — I5022 Chronic systolic (congestive) heart failure: Secondary | ICD-10-CM | POA: Diagnosis not present

## 2023-03-20 ENCOUNTER — Telehealth (HOSPITAL_COMMUNITY): Payer: Self-pay | Admitting: Emergency Medicine

## 2023-03-20 NOTE — Telephone Encounter (Signed)
Called and spoke to [name].  Introduced myself and scheduled a initial home visit for Thurs 4/11 @ 11:00.  She confirms she has a scale and a pill box.    [name], EMT-Paramedic [phone] 03/20/2023

## 2023-03-20 NOTE — Progress Notes (Signed)
EPIC Encounter for ICM Monitoring  Patient Name: Brittany [name] is a 62 y.o. female Date: 03/20/2023 Primary Care Physican: No primary care provider on file. Primary Cardiologist: Bensimhon Electrophysiologist: Lalla Brothers 11/28/2022 Weight: 103 lbs 12/20/2022 Weight: 100 lbs 02/13/2023 Weight: 100-102 lbs 03/20/2023 Weight: 100 lbs   Spoke with patient and heart failure questions reviewed.  Transmission results reviewed.  Pt reports feeling SOB of breath last night.       Optivol Thoracic impedance suggesting possible fluid accumulation starting 4/1.   Barostim implant.   Prescribed: Torsemide 20 mg take 4 tablets (80 mg total) by mouth every morning and 2 tablets (40 mg total) every evening.  Potassium 10 mEq Take 4 tablet (40 mEq total) by mouth twice a day.  Take extra 40 mEq when you take metolazone Metolazone 2.5 mg take 1 tablet by mouth as directed Spironolactone 25 mg take 1 tablet (25 mg total) by mouth daily Farxiga 10 mg take 1 tablet daily   Labs: 03/07/2023 Creatinine 1.29, BUN 34, Potassium 4.5, Sodium 136, GFR 47 03/03/2023 Creatinine 1.31, BUN 34, Potassium 3.8, Sodium 134, GFR 46 01/29/2023 Creatinine 1.14, BUN 21, Potassium 4.1, Sodium 136, GFR 54 01/15/2023 Creatinine 1.37, BUN 19, Potassium 3.4, Sodium 135, GFR 44 12/19/2022 Creatinine 1.28, BUN 14, Potassium 3.4, Sodium 138, GFR 47 (resulting in an increase in K+ dosage) A complete set of results can be found in Results Review.   Recommendations:   Pt will take Metolazone and extra Potassium as prescribed.   Advised to call back if condition changes.    Follow-up plan: ICM clinic phone appointment on 03/23/2023 to recheck fluid levels.   91 day device clinic remote transmission 03/21/2023.     EP/Cardiology Office Visits:  04/16/2023 with HF clinic.      Copy of ICM check sent to Dr. Lalla Brothers.    3 month ICM trend: 03/19/2023.    12-14 Month ICM trend:     Karie Soda, RN 03/20/2023 1:41 PM

## 2023-03-21 ENCOUNTER — Ambulatory Visit (INDEPENDENT_AMBULATORY_CARE_PROVIDER_SITE_OTHER): Payer: BC Managed Care – PPO

## 2023-03-21 DIAGNOSIS — I255 Ischemic cardiomyopathy: Secondary | ICD-10-CM | POA: Diagnosis not present

## 2023-03-21 LAB — CUP PACEART REMOTE DEVICE CHECK
Battery Remaining Longevity: 93 mo
Battery Voltage: 3 V
Brady Statistic RV Percent Paced: 0.01 %
Date Time Interrogation Session: 20240410033523
HighPow Impedance: 66 Ohm
Implantable Lead Connection Status: 753985
Implantable Lead Implant Date: 20190905
Implantable Lead Location: 753860
Implantable Pulse Generator Implant Date: 20190905
Lead Channel Impedance Value: 342 Ohm
Lead Channel Impedance Value: 437 Ohm
Lead Channel Pacing Threshold Amplitude: 0.75 V
Lead Channel Pacing Threshold Pulse Width: 0.4 ms
Lead Channel Sensing Intrinsic Amplitude: 19.125 mV
Lead Channel Sensing Intrinsic Amplitude: 19.125 mV
Lead Channel Setting Pacing Amplitude: 2 V
Lead Channel Setting Pacing Pulse Width: 0.4 ms
Lead Channel Setting Sensing Sensitivity: 0.3 mV
Zone Setting Status: 755011
Zone Setting Status: 755011

## 2023-03-22 ENCOUNTER — Telehealth (HOSPITAL_COMMUNITY): Payer: Self-pay | Admitting: Emergency Medicine

## 2023-03-22 NOTE — Telephone Encounter (Signed)
[name] called to rescheudule today's initial paramedicine visit.  Rescheduled for 4/16 @ 11:30.    [name], EMT-Paramedic [phone] 03/22/2023

## 2023-03-23 ENCOUNTER — Ambulatory Visit: Payer: BC Managed Care – PPO | Attending: Cardiology

## 2023-03-23 DIAGNOSIS — Z95810 Presence of automatic (implantable) cardiac defibrillator: Secondary | ICD-10-CM

## 2023-03-23 DIAGNOSIS — I5022 Chronic systolic (congestive) heart failure: Secondary | ICD-10-CM

## 2023-03-23 NOTE — Progress Notes (Signed)
EPIC Encounter for ICM Monitoring  Patient Name: Brittany [name] is a 62 y.o. female Date: 03/23/2023 Primary Care Physican: No primary care provider on file. Primary Cardiologist: Bensimhon Electrophysiologist: Lalla Brothers 11/28/2022 Weight: 103 lbs 12/20/2022 Weight: 100 lbs 02/13/2023 Weight: 100-102 lbs 03/20/2023 Weight: 100 lbs   Spoke with patient and heart failure questions reviewed.  Transmission results reviewed.  Pt reports breathing returned to baseline normal after taking Metolazone.      Optivol Thoracic impedance suggesting fluid levels returned to normal after taking Metolazone with extra Potassium.   Barostim implant.   Prescribed: Torsemide 20 mg take 4 tablets (80 mg total) by mouth every morning and 2 tablets (40 mg total) every evening.  Potassium 10 mEq Take 4 tablet (40 mEq total) by mouth twice a day.  Take extra 40 mEq when you take metolazone Metolazone 2.5 mg take 1 tablet by mouth as directed Spironolactone 25 mg take 1 tablet (25 mg total) by mouth daily Farxiga 10 mg take 1 tablet daily   Labs: 03/15/2023 Creatinine 1.14, BUN 36, Potassium 3.3, Sodium 140, GFR 54 03/07/2023 Creatinine 1.29, BUN 34, Potassium 4.5, Sodium 136, GFR 47 03/03/2023 Creatinine 1.31, BUN 34, Potassium 3.8, Sodium 134, GFR 46 01/29/2023 Creatinine 1.14, BUN 21, Potassium 4.1, Sodium 136, GFR 54 01/15/2023 Creatinine 1.37, BUN 19, Potassium 3.4, Sodium 135, GFR 44 12/19/2022 Creatinine 1.28, BUN 14, Potassium 3.4, Sodium 138, GFR 47 (resulting in an increase in K+ dosage) A complete set of results can be found in Results Review.   Recommendations:  Confirmed she took extra Potassium with Metolazone.  No changes and encouraged to call if experiencing any fluid symptoms.   Follow-up plan: ICM clinic phone appointment on 04/02/2023 to recheck fluid levels.   91 day device clinic remote transmission 06/20/2023.     EP/Cardiology Office Visits:  04/16/2023 with HF clinic.      Copy of  ICM check sent to Dr. Lalla Brothers.    3 month ICM trend: 03/23/2023.    12-14 Month ICM trend:     Karie Soda, RN 03/23/2023 8:17 AM

## 2023-03-27 ENCOUNTER — Other Ambulatory Visit (HOSPITAL_COMMUNITY): Payer: Self-pay | Admitting: Emergency Medicine

## 2023-03-27 NOTE — Progress Notes (Unsigned)
SOCIAL/MEDICAL BARRIERS:  PHARMACY USED  [facility] [address]  PCP    INSURANCE BCBS, Medicaid & Medicare  MED ISSUES:  AFFORDABILITY NONE  PT ASSIST APPS NEEDED NONE  SOURCE OF INCOME Disability  TRANSPORTATION yes  FOOD INSECURITIES/NEEDS NONE FOOD STAMPS $23  REVIEWED DIET/FLUID/SALT RESTRICTIONS Yes  RENT/OWN HOME ISSUES Stays in motel  SOCIAL SUPPORT Yes-family  SAFETY/DOMESTIC ISSUES No issues  SUBSTANCE ABUSE uses [facility] for medical puposes   DAILY WEIGHTS YES  EDUCATE ON DISEASE PROCESS/SYMPTOMS/PURPOSES OF MEDS pt familiar w/ meds and doses   Paramedicine Encounter    Patient ID: Brittany [name], female    DOB: Jun 29, 1961, 62 y.o.   MRN: [id number]   Complaints None  Assessment A&O x4, skin W&D w/ good color.  No edema noted Lung sounds clear and equal bilat  Compliance with meds Yes  Pill box filled does not use pill box  Refills needed   Meds changes since last visit NONE    Social changes NONE   BP 100/70 (BP Location: Left Arm, Patient Position: Sitting, Cuff Size: Normal)   Pulse 96   Resp 16   Wt 97 lb (44 kg)   SpO2 96%   BMI 19.59 kg/m  Weight yesterday-not taken Last visit weight-101lb  (03/15/23 IN CLINIC)  Today was my initial hove visit with Ms. Mitzi Davenport.  The visit took place in an extended stay hotel.  She says she has a home but prefers to have her "private space" at the hotel.  She denies any chest pain and no unusual SOB - only when she exerts herself.  She has participated in the paramedicine program in the past and is familiar with our services. All of her medicines were reviewed with her as to what they are for and she was able to tell me when she takes each medicine and whether once or twice a day.  She advised me that she is not taking Digoxin or Spironolactone and did not have bottles for these meds with her.  I sent a message to HF Triage requesting confirmation that she should still be taking these meds  and for prescriptions to be called in for her. She advises that she has no issues with affording her medications.  She does state that she has issues affording her Ensure which she is supposed to drink twice a day but only does once a day due to cost.   I also discussed with pt the benefits of utilizing a pill box to provide a visual of med compliance and she finally agreed to same and wishes to begining this during next visit.  I also requested she have all of her medications with her at our next home visit so the med box can be filled completely.     ACTION: Home visit completed  Bethanie Dicker [phone] 03/27/23  Patient Care Team: Hillis Range, MD (Inactive) as PCP - Electrophysiology (Cardiology) [name] Bevelyn Buckles, MD as PCP - Advanced Heart Failure (Cardiology)  Patient Active Problem List   Diagnosis Date Noted   COPD with acute exacerbation 03/01/2023   CAD (coronary artery disease) 03/01/2023   Acute respiratory failure with hypoxia 11/14/2022   Sepsis 11/14/2022   Influenza A with pneumonia 11/14/2022   Elevated troponin 11/14/2022   History of lung cancer 11/14/2022   Acute combined systolic and diastolic CHF, NYHA class 4 07/09/2022   Malnutrition of moderate degree 06/20/2022   Lactic acidosis 04/12/2022   Prolonged QT interval 04/09/2022   Hypotension 04/09/2022  Chronic HFrEF (heart failure with reduced ejection fraction) 12/11/2021   CHF exacerbation 12/10/2021   Renal infarct 12/10/2021   COPD exacerbation 09/29/2021   Mixed diabetic hyperlipidemia associated with type 2 diabetes mellitus 09/29/2021   CKD (chronic kidney disease) stage 3, GFR 30-59 ml/min 09/29/2021   Unstable angina 02/09/2021   Hemoptysis 10/20/2020   Type 2 diabetes mellitus with stage 3a chronic kidney disease, without long-term current use of insulin 10/20/2020   Acute on chronic combined systolic and diastolic CHF (congestive heart failure) 05/21/2020   Ischemic  cardiomyopathy 04/12/2020   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   NSTEMI (non-ST elevated myocardial infarction) 02/12/2019   CHF (congestive heart failure) 11/24/2018   Recurrent pleural effusion on right 05/05/2018   Acute on chronic respiratory failure with hypoxia 05/05/2018   Chronic respiratory failure with hypoxia    Asthma    Anxiety    Allergy    HCAP (healthcare-associated pneumonia) 03/15/2015   Chest pain 03/15/2015   Small cell lung cancer 03/16/2014   Protein-calorie malnutrition, severe 03/14/2014   AF (paroxysmal atrial fibrillation) 03/12/2014   Essential hypertension 05/07/2012    Current Outpatient Medications:    albuterol (VENTOLIN HFA) 108 (90 Base) MCG/ACT inhaler, Inhale 2 puffs into the lungs every 6 (six) hours as needed for wheezing or shortness of breath., Disp: 8.5 g, Rfl: 1   apixaban (ELIQUIS) 5 MG TABS tablet, Take 1 tablet (5 mg total) by mouth 2 (two) times daily., Disp: 60 tablet, Rfl: 1   atorvastatin (LIPITOR) 80 MG tablet, Take 1 tablet (80 mg total) by mouth daily at 6 PM., Disp: 30 tablet, Rfl: 1   blood glucose meter kit and supplies KIT, Dispense based on patient and insurance preference. Use up to four times daily as directed., Disp: 1 each, Rfl: 0   dapagliflozin propanediol (FARXIGA) 10 MG TABS tablet, Take 1 tablet by mouth daily., Disp: 30 tablet, Rfl: 11   ipratropium-albuterol (DUONEB) 0.5-2.5 (3) MG/3ML SOLN, Take 3 mLs by nebulization every 4 (four) hours as needed (for shortness of breath)., Disp: 90 mL, Rfl: 3   ivabradine (CORLANOR) 7.5 MG TABS tablet, Take 1 tablet (7.5 mg total) by mouth 2 (two) times daily with a meal. TAKE 1 TABLET (7.5 MG TOTAL) BY MOUTH 2 (TWO) TIMES DAILY WITH A MEAL., Disp: 180 tablet, Rfl: 1   metolazone (ZAROXOLYN) 2.5 MG tablet, Take 1 tablet (2.5 mg total) by mouth as directed., Disp: 15 tablet, Rfl: 1   Multiple Vitamin (MULTIVITAMIN WITH MINERALS) TABS tablet, Take 1 tablet by  mouth daily., Disp: , Rfl:    naphazoline-pheniramine (VISINE) 0.025-0.3 % ophthalmic solution, Place 1 drop into both eyes 4 (four) times daily as needed for eye irritation., Disp: , Rfl:    nitroGLYCERIN (NITROSTAT) 0.4 MG SL tablet, Place 1 tablet (0.4 mg total) under the tongue every 5 (five) minutes as needed for chest pain., Disp: 90 tablet, Rfl: 5   Nutritional Supplements (ENSURE ORIGINAL) LIQD, Take 2 Bottles by mouth daily., Disp: , Rfl:    potassium chloride (KLOR-CON M) 10 MEQ tablet, Take 4 tablets (40 mEq total) by mouth 2 (two) times daily. Take extra 4 tablets (40 meq total) when you take PRN metolazone., Disp: 285 tablet, Rfl: 5   torsemide (DEMADEX) 20 MG tablet, Take 80 mg by mouth daily., Disp: , Rfl:    digoxin (LANOXIN) 0.125 MG tablet, Take 1 tablet (0.125 mg total) by mouth daily. (Patient not taking: Reported on 03/27/2023), Disp: 30 tablet, Rfl:  5   predniSONE (DELTASONE) 10 MG tablet, Take 4 tabs for 3 days, then 3 tabs for 3 days, then 2 tabs for 3 days, then 1 tab for 3 days, then 1/2 tab for 4 days. (Patient not taking: Reported on 03/27/2023), Disp: 32 tablet, Rfl: 0   spironolactone (ALDACTONE) 25 MG tablet, Take 1 tablet (25 mg total) by mouth daily. (Patient not taking: Reported on 03/27/2023), Disp: 45 tablet, Rfl: 3 Allergies  Allergen Reactions   Codeine Nausea And Vomiting     Social History   Socioeconomic History   Marital status: Married    Spouse name: Tristan Schroeder    Number of children: 1   Years of education: Not on file   Highest education level: Not on file  Occupational History   Not on file  Tobacco Use   Smoking status: Former    Packs/day: 1.00    Years: 20.00    Additional pack years: 0.00    Total pack years: 20.00    Types: Cigarettes    Quit date: 03/13/2014    Years since quitting: 9.0   Smokeless tobacco: Never  Vaping Use   Vaping Use: Never used  Substance and Sexual Activity   Alcohol use: Yes    Alcohol/week: 5.0  standard drinks of alcohol    Types: 5 Glasses of wine per week   Drug use: Yes    Types: Marijuana    Comment: "medicinal"   Sexual activity: Never  Other Topics Concern   Not on file  Social History Narrative   Not on file   Social Determinants of Health   Financial Resource Strain: Medium Risk (02/11/2021)   Overall Financial Resource Strain (CARDIA)    Difficulty of Paying Living Expenses: Somewhat hard  Food Insecurity: No Food Insecurity (03/01/2023)   Hunger Vital Sign    Worried About Running Out of Food in the Last Year: Never true    Ran Out of Food in the Last Year: Never true  Transportation Needs: Patient Declined (03/01/2023)   PRAPARE - Transportation    Lack of Transportation (Medical): Patient declined    Lack of Transportation (Non-Medical): Patient declined  Physical Activity: Insufficiently Active (02/11/2021)   Exercise Vital Sign    Days of Exercise per Week: 2 days    Minutes of Exercise per Session: 10 min  Stress: Stress Concern Present (02/11/2021)   [employer] of Occupational Health - Occupational Stress Questionnaire    Feeling of Stress : To some extent  Social Connections: Unknown (02/11/2021)   Social Connection and Isolation Panel [NHANES]    Frequency of Communication with Friends and Family: More than three times a week    Frequency of Social Gatherings with Friends and Family: Twice a week    Attends Religious Services: Not on file    Active Member of Clubs or Organizations: Not on file    Attends Banker Meetings: Never    Marital Status: Married  Catering manager Violence: Not At Risk (03/01/2023)   Humiliation, Afraid, Rape, and Kick questionnaire    Fear of Current or Ex-Partner: No    Emotionally Abused: No    Physically Abused: No    Sexually Abused: No    Physical Exam      Future Appointments  Date Time Provider Department Center  04/02/2023  7:35 AM [facility] DEVICE REMOTES [facility]  04/11/2023   2:00 PM Tomma Lightning, MD [facility] None  04/16/2023  2:30 PM [facility] PA/NP [facility] None  06/20/2023 12:40 PM [facility] DEVICE REMOTES [facility]  09/19/2023 12:40 PM [facility] DEVICE REMOTES [facility]  12/19/2023 12:40 PM [facility] DEVICE REMOTES [facility]  03/19/2024 12:40 PM [facility] DEVICE REMOTES [facility]  06/18/2024 12:40 PM [facility] DEVICE REMOTES [facility]

## 2023-03-28 ENCOUNTER — Telehealth (HOSPITAL_COMMUNITY): Payer: Self-pay | Admitting: Cardiology

## 2023-03-28 MED ORDER — DIGOXIN 125 MCG PO TABS: 0.1250 mg | ORAL_TABLET | Freq: Every day | ORAL | 3 refills | Status: DC

## 2023-03-28 MED ORDER — SPIRONOLACTONE 25 MG PO TABS: 25.0000 mg | ORAL_TABLET | Freq: Every day | ORAL | 3 refills | Status: DC

## 2023-03-28 NOTE — Telephone Encounter (Signed)
Scripts sent

## 2023-03-28 NOTE — Telephone Encounter (Signed)
-----   Message from [name], Paramedic sent at 03/27/2023  5:48 PM EDT ----- Regarding: Refills Did an initial visit with pt today.  Reviewing meds she advised me she is not taking Digoxin or Spironolactone.  Reviewed notes from [facility] regarding these meds an it appears she is supposed to be taking these.  No bottles were present with pt during this visit.  Could she get refills from her designated pharmacy?  Vernia Buff, EMT-Paramedic [phone] 03/27/2023

## 2023-04-02 ENCOUNTER — Ambulatory Visit: Payer: BC Managed Care – PPO | Attending: Cardiology

## 2023-04-02 DIAGNOSIS — Z95810 Presence of automatic (implantable) cardiac defibrillator: Secondary | ICD-10-CM

## 2023-04-02 DIAGNOSIS — I5022 Chronic systolic (congestive) heart failure: Secondary | ICD-10-CM

## 2023-04-03 ENCOUNTER — Telehealth (HOSPITAL_COMMUNITY): Payer: Self-pay | Admitting: Emergency Medicine

## 2023-04-03 NOTE — Telephone Encounter (Signed)
Called this morning @ 9:06 to cancel today's appointment.  She wishes to reschedule for 4/30 @ 1:00.     [name], EMT-Paramedic [phone] 04/03/2023

## 2023-04-06 NOTE — Progress Notes (Signed)
EPIC Encounter for ICM Monitoring  Patient Name: Brittany [name] is a 62 y.o. female Date: 04/06/2023 Primary Care Physican: No primary care provider on file. Primary Cardiologist: Bensimhon Electrophysiologist: Lalla Brothers 11/28/2022 Weight: 103 lbs 12/20/2022 Weight: 100 lbs 02/13/2023 Weight: 100-102 lbs 03/20/2023 Weight: 100 lbs   Spoke with patient and heart failure questions reviewed.  Transmission results reviewed.  Pt asymptomatic for fluid accumulation.  Reports feeling well at this time and voices no complaints.      Optivol Thoracic impedance suggesting since 4/12 ICM check there has been possible fluid accumulation from 4/17-4/21 and returned to baseline 4/22   Barostim implant.   Prescribed: Torsemide 20 mg take 4 tablets (80 mg total) by mouth every morning and 2 tablets (40 mg total) every evening.  Potassium 10 mEq Take 4 tablet (40 mEq total) by mouth twice a day.  Take extra 40 mEq when you take metolazone Metolazone 2.5 mg take 1 tablet by mouth as directed Spironolactone 25 mg take 1 tablet (25 mg total) by mouth daily Farxiga 10 mg take 1 tablet daily   Labs: 03/15/2023 Creatinine 1.14, BUN 36, Potassium 3.3, Sodium 140, GFR 54 03/07/2023 Creatinine 1.29, BUN 34, Potassium 4.5, Sodium 136, GFR 47 03/03/2023 Creatinine 1.31, BUN 34, Potassium 3.8, Sodium 134, GFR 46 01/29/2023 Creatinine 1.14, BUN 21, Potassium 4.1, Sodium 136, GFR 54 01/15/2023 Creatinine 1.37, BUN 19, Potassium 3.4, Sodium 135, GFR 44 12/19/2022 Creatinine 1.28, BUN 14, Potassium 3.4, Sodium 138, GFR 47 (resulting in an increase in K+ dosage) A complete set of results can be found in Results Review.   Recommendations:  No changes and encouraged to call if experiencing any fluid symptoms.   Follow-up plan: ICM clinic phone appointment on 04/23/2023.   91 day device clinic remote transmission 06/20/2023.     EP/Cardiology Office Visits:  04/16/2023 with HF clinic.      Copy of ICM check sent to  Dr. Lalla Brothers.   3 month ICM trend: 04/02/2023.    12-14 Month ICM trend:     Karie Soda, RN 04/06/2023 12:00 PM

## 2023-04-10 ENCOUNTER — Other Ambulatory Visit (HOSPITAL_COMMUNITY): Payer: Self-pay | Admitting: Emergency Medicine

## 2023-04-10 NOTE — Progress Notes (Signed)
Paramedicine Encounter    Patient ID: Brittany [name], female    DOB: March 21, 1961, 62 y.o.   MRN: [id number]   Complaints Nauseated  Assessment A&O x 4, skin W&D w/ good color. Lung sounds clear bilat. With non-productive cough which she states is related to allergies.  Compliance with meds Missed Farxiga and Eliquis yesterday otherwise compliant  Pill box filled Filled by patient prior to my arrival.    Refills needed none  Meds changes since last visit None    Social changes none   BP (!) 100/0 (BP Location: Left Arm, Patient Position: Sitting, Cuff Size: Small) Comment: palpated  Pulse 100   Resp 16   Wt 95 lb (43.1 kg)   SpO2 100%   BMI 19.19 kg/m  Weight yesterday-not taken Last visit weight-97lb  Home visit finds pt reporting to feel a little nauseated  today and has not taken her morning meds yet.  She has filled her own pill box prior to my arrival and there were no bottles present for me to review for accuracy.  She denies chest pain or SOB.  She continues to loose weight and is down 2lb from last home visit.  She states she left her farxiga and eliquis in a friends car yesterday and missed yesterday's dose.  The friend is dropping the meds off to her later today. She has a non-productive cough which she says is related to her allergies. Lung sounds clear bilat.  No edema noted.  She has a pending appt. With her pulmonologist tomorrow and H&V on 04/16/23.  No other needs at this time.   ACTION: Home visit completed  Bethanie Dicker [phone] 04/10/23  Patient Care Team: Hillis Range, MD (Inactive) as PCP - Electrophysiology (Cardiology) [name] Bevelyn Buckles, MD as PCP - Advanced Heart Failure (Cardiology)  Patient Active Problem List   Diagnosis Date Noted   COPD with acute exacerbation (HCC) 03/01/2023   CAD (coronary artery disease) 03/01/2023   Acute respiratory failure with hypoxia (HCC) 11/14/2022   Sepsis (HCC) 11/14/2022   Influenza  A with pneumonia 11/14/2022   Elevated troponin 11/14/2022   History of lung cancer 11/14/2022   Acute combined systolic and diastolic CHF, NYHA class 4 (HCC) 07/09/2022   Malnutrition of moderate degree 06/20/2022   Lactic acidosis 04/12/2022   Prolonged QT interval 04/09/2022   Hypotension 04/09/2022   Chronic HFrEF (heart failure with reduced ejection fraction) (HCC) 12/11/2021   CHF exacerbation (HCC) 12/10/2021   Renal infarct (HCC) 12/10/2021   COPD exacerbation (HCC) 09/29/2021   Mixed diabetic hyperlipidemia associated with type 2 diabetes mellitus (HCC) 09/29/2021   CKD (chronic kidney disease) stage 3, GFR 30-59 ml/min (HCC) 09/29/2021   Unstable angina (HCC) 02/09/2021   Hemoptysis 10/20/2020   Type 2 diabetes mellitus with stage 3a chronic kidney disease, without long-term current use of insulin (HCC) 10/20/2020   Acute on chronic combined systolic and diastolic CHF (congestive heart failure) (HCC) 05/21/2020   Ischemic cardiomyopathy 04/12/2020   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   NSTEMI (non-ST elevated myocardial infarction) (HCC) 02/12/2019   CHF (congestive heart failure) (HCC) 11/24/2018   Recurrent pleural effusion on right 05/05/2018   Acute on chronic respiratory failure with hypoxia (HCC) 05/05/2018   Chronic respiratory failure with hypoxia (HCC)    Asthma    Anxiety    Allergy    HCAP (healthcare-associated pneumonia) 03/15/2015   Chest pain 03/15/2015   Small cell lung cancer (HCC) 03/16/2014   Protein-calorie malnutrition, severe (  HCC) 03/14/2014   AF (paroxysmal atrial fibrillation) (HCC) 03/12/2014   Essential hypertension 05/07/2012    Current Outpatient Medications:    albuterol (VENTOLIN HFA) 108 (90 Base) MCG/ACT inhaler, Inhale 2 puffs into the lungs every 6 (six) hours as needed for wheezing or shortness of breath., Disp: 8.5 g, Rfl: 1   apixaban (ELIQUIS) 5 MG TABS tablet, Take 1 tablet (5 mg total) by mouth 2  (two) times daily., Disp: 60 tablet, Rfl: 1   atorvastatin (LIPITOR) 80 MG tablet, Take 1 tablet (80 mg total) by mouth daily at 6 PM., Disp: 30 tablet, Rfl: 1   dapagliflozin propanediol (FARXIGA) 10 MG TABS tablet, Take 1 tablet by mouth daily., Disp: 30 tablet, Rfl: 11   digoxin (LANOXIN) 0.125 MG tablet, Take 1 tablet (0.125 mg total) by mouth daily., Disp: 30 tablet, Rfl: 3   ipratropium-albuterol (DUONEB) 0.5-2.5 (3) MG/3ML SOLN, Take 3 mLs by nebulization every 4 (four) hours as needed (for shortness of breath)., Disp: 90 mL, Rfl: 3   ivabradine (CORLANOR) 7.5 MG TABS tablet, Take 1 tablet (7.5 mg total) by mouth 2 (two) times daily with a meal. TAKE 1 TABLET (7.5 MG TOTAL) BY MOUTH 2 (TWO) TIMES DAILY WITH A MEAL., Disp: 180 tablet, Rfl: 1   metolazone (ZAROXOLYN) 2.5 MG tablet, Take 1 tablet (2.5 mg total) by mouth as directed., Disp: 15 tablet, Rfl: 1   naphazoline-pheniramine (VISINE) 0.025-0.3 % ophthalmic solution, Place 1 drop into both eyes 4 (four) times daily as needed for eye irritation., Disp: , Rfl:    nitroGLYCERIN (NITROSTAT) 0.4 MG SL tablet, Place 1 tablet (0.4 mg total) under the tongue every 5 (five) minutes as needed for chest pain., Disp: 90 tablet, Rfl: 5   Nutritional Supplements (ENSURE ORIGINAL) LIQD, Take 2 Bottles by mouth daily., Disp: , Rfl:    potassium chloride (KLOR-CON M) 10 MEQ tablet, Take 4 tablets (40 mEq total) by mouth 2 (two) times daily. Take extra 4 tablets (40 meq total) when you take PRN metolazone., Disp: 285 tablet, Rfl: 5   spironolactone (ALDACTONE) 25 MG tablet, Take 1 tablet (25 mg total) by mouth daily., Disp: 90 tablet, Rfl: 3   torsemide (DEMADEX) 20 MG tablet, Take 80 mg by mouth daily., Disp: , Rfl:    blood glucose meter kit and supplies KIT, Dispense based on patient and insurance preference. Use up to four times daily as directed., Disp: 1 each, Rfl: 0   Multiple Vitamin (MULTIVITAMIN WITH MINERALS) TABS tablet, Take 1 tablet by mouth  daily. (Patient not taking: Reported on 04/10/2023), Disp: , Rfl:    predniSONE (DELTASONE) 10 MG tablet, Take 4 tabs for 3 days, then 3 tabs for 3 days, then 2 tabs for 3 days, then 1 tab for 3 days, then 1/2 tab for 4 days. (Patient not taking: Reported on 03/27/2023), Disp: 32 tablet, Rfl: 0 Allergies  Allergen Reactions   Codeine Nausea And Vomiting     Social History   Socioeconomic History   Marital status: Married    Spouse name: Tristan Schroeder    Number of children: 1   Years of education: Not on file   Highest education level: Not on file  Occupational History   Not on file  Tobacco Use   Smoking status: Former    Packs/day: 1.00    Years: 20.00    Additional pack years: 0.00    Total pack years: 20.00    Types: Cigarettes    Quit date: 03/13/2014  Years since quitting: 9.0   Smokeless tobacco: Never  Vaping Use   Vaping Use: Never used  Substance and Sexual Activity   Alcohol use: Yes    Alcohol/week: 5.0 standard drinks of alcohol    Types: 5 Glasses of wine per week   Drug use: Yes    Types: Marijuana    Comment: "medicinal"   Sexual activity: Never  Other Topics Concern   Not on file  Social History Narrative   Not on file   Social Determinants of Health   Financial Resource Strain: Medium Risk (02/11/2021)   Overall Financial Resource Strain (CARDIA)    Difficulty of Paying Living Expenses: Somewhat hard  Food Insecurity: No Food Insecurity (03/01/2023)   Hunger Vital Sign    Worried About Running Out of Food in the Last Year: Never true    Ran Out of Food in the Last Year: Never true  Transportation Needs: Patient Declined (03/01/2023)   PRAPARE - Transportation    Lack of Transportation (Medical): Patient declined    Lack of Transportation (Non-Medical): Patient declined  Physical Activity: Insufficiently Active (02/11/2021)   Exercise Vital Sign    Days of Exercise per Week: 2 days    Minutes of Exercise per Session: 10 min  Stress: Stress  Concern Present (02/11/2021)   [employer] of Occupational Health - Occupational Stress Questionnaire    Feeling of Stress : To some extent  Social Connections: Unknown (02/11/2021)   Social Connection and Isolation Panel [NHANES]    Frequency of Communication with Friends and Family: More than three times a week    Frequency of Social Gatherings with Friends and Family: Twice a week    Attends Religious Services: Not on file    Active Member of Clubs or Organizations: Not on file    Attends Banker Meetings: Never    Marital Status: Married  Catering manager Violence: Not At Risk (03/01/2023)   Humiliation, Afraid, Rape, and Kick questionnaire    Fear of Current or Ex-Partner: No    Emotionally Abused: No    Physically Abused: No    Sexually Abused: No    Physical Exam      Future Appointments  Date Time Provider Department Center  04/11/2023  2:00 PM Tomma Lightning, MD [facility] None  04/16/2023  2:30 PM [facility] PA/NP [facility] None  04/23/2023  7:25 AM [facility] DEVICE REMOTES [facility]  06/20/2023 12:40 PM [facility] DEVICE REMOTES [facility]  09/19/2023 12:40 PM [facility] DEVICE REMOTES [facility]  12/19/2023 12:40 PM [facility] DEVICE REMOTES [facility]  03/19/2024 12:40 PM [facility] DEVICE REMOTES [facility]  06/18/2024 12:40 PM [facility] DEVICE REMOTES [facility]

## 2023-04-11 ENCOUNTER — Encounter: Payer: Self-pay | Admitting: Pulmonary Disease

## 2023-04-11 ENCOUNTER — Ambulatory Visit (INDEPENDENT_AMBULATORY_CARE_PROVIDER_SITE_OTHER): Payer: BC Managed Care – PPO | Admitting: Pulmonary Disease

## 2023-04-11 VITALS — BP 100/68 | HR 90 | Ht 59.0 in | Wt 96.4 lb

## 2023-04-11 DIAGNOSIS — C3490 Malignant neoplasm of unspecified part of unspecified bronchus or lung: Secondary | ICD-10-CM

## 2023-04-11 DIAGNOSIS — J449 Chronic obstructive pulmonary disease, unspecified: Secondary | ICD-10-CM | POA: Diagnosis not present

## 2023-04-11 DIAGNOSIS — E440 Moderate protein-calorie malnutrition: Secondary | ICD-10-CM

## 2023-04-11 DIAGNOSIS — E782 Mixed hyperlipidemia: Secondary | ICD-10-CM

## 2023-04-11 DIAGNOSIS — N280 Ischemia and infarction of kidney: Secondary | ICD-10-CM

## 2023-04-11 DIAGNOSIS — I25119 Atherosclerotic heart disease of native coronary artery with unspecified angina pectoris: Secondary | ICD-10-CM

## 2023-04-11 DIAGNOSIS — Z95811 Presence of heart assist device: Secondary | ICD-10-CM | POA: Diagnosis not present

## 2023-04-11 DIAGNOSIS — N1831 Chronic kidney disease, stage 3a: Secondary | ICD-10-CM

## 2023-04-11 DIAGNOSIS — N1832 Chronic kidney disease, stage 3b: Secondary | ICD-10-CM

## 2023-04-11 DIAGNOSIS — E1169 Type 2 diabetes mellitus with other specified complication: Secondary | ICD-10-CM

## 2023-04-11 MED ORDER — TRELEGY ELLIPTA 200-62.5-25 MCG/ACT IN AEPB: 1.0000 | INHALATION_SPRAY | Freq: Every day | RESPIRATORY_TRACT | 5 refills | Status: DC

## 2023-04-11 NOTE — Addendum Note (Signed)
Addended by: Lanna Poche on: 04/11/2023 03:22 PM   Modules accepted: Orders

## 2023-04-11 NOTE — Patient Instructions (Addendum)
Start Trelegy 100-1 puff daily  Continue with albuterol as needed  Schedule for pulmonary function test  Schedule for CT scan with contrast  I will see you back in the office in about 2 to 3 months  Call us with significant concerns

## 2023-04-11 NOTE — Progress Notes (Signed)
Brittany [name]    [id number]    December 26, 1960  Primary Care Physician:Pcp, No  Referring Physician: Sherald Hess, NP [address] [place],  [place] [address]  Chief complaint:    Patient being seen for obstructive lung disease  HPI:  Shortness of breath with activities This is multifactorial Does have a history of COPD, history of asthma, past history of small cell lung cancer that was treated with chemoradiation, history of acute on chronic systolic heart failure  History of paroxysmal atrial fibrillation hypertension s/p CABG  Treated for small cell lung cancer with chemotherapy, radiation treatment, prophylactic brain radiation in 2015 Was last seen by Dr. Shirline Frees in 2021, was supposed to follow-up after about a year  Complicated cardiac history as well with history of non-ST elevation MI, heart failure, CABG, multiple hospitalizations for heart failure pleural effusion, s/p pleurodesis  Intermittently does have hemoptysis the last 1 was about a month ago Did have a bronchoscopy performed October 2021-no endobronchial lesions noted  Exercise tolerance about 2 blocks  She uses oxygen at night  Last PFT was in June 2022 showing moderately severe obstructive disease with reduction in diffusing capacity-at least Gold stage II obstructive lung disease  Quit smoking in 2015  Outpatient Encounter Medications as of 04/11/2023  Medication Sig   albuterol (VENTOLIN HFA) 108 (90 Base) MCG/ACT inhaler Inhale 2 puffs into the lungs every 6 (six) hours as needed for wheezing or shortness of breath.   apixaban (ELIQUIS) 5 MG TABS tablet Take 1 tablet (5 mg total) by mouth 2 (two) times daily.   atorvastatin (LIPITOR) 80 MG tablet Take 1 tablet (80 mg total) by mouth daily at 6 PM.   blood glucose meter kit and supplies KIT Dispense based on patient and insurance preference. Use up to four times daily as directed.   dapagliflozin propanediol (FARXIGA) 10 MG TABS  tablet Take 1 tablet by mouth daily.   digoxin (LANOXIN) 0.125 MG tablet Take 1 tablet (0.125 mg total) by mouth daily.   Fluticasone-Umeclidin-Vilant (TRELEGY ELLIPTA) 200-62.5-25 MCG/ACT AEPB Inhale 1 puff into the lungs daily.   ipratropium-albuterol (DUONEB) 0.5-2.5 (3) MG/3ML SOLN Take 3 mLs by nebulization every 4 (four) hours as needed (for shortness of breath).   ivabradine (CORLANOR) 7.5 MG TABS tablet Take 1 tablet (7.5 mg total) by mouth 2 (two) times daily with a meal. TAKE 1 TABLET (7.5 MG TOTAL) BY MOUTH 2 (TWO) TIMES DAILY WITH A MEAL.   metolazone (ZAROXOLYN) 2.5 MG tablet Take 1 tablet (2.5 mg total) by mouth as directed.   Multiple Vitamin (MULTIVITAMIN WITH MINERALS) TABS tablet Take 1 tablet by mouth daily.   naphazoline-pheniramine (VISINE) 0.025-0.3 % ophthalmic solution Place 1 drop into both eyes 4 (four) times daily as needed for eye irritation.   nitroGLYCERIN (NITROSTAT) 0.4 MG SL tablet Place 1 tablet (0.4 mg total) under the tongue every 5 (five) minutes as needed for chest pain.   Nutritional Supplements (ENSURE ORIGINAL) LIQD Take 2 Bottles by mouth daily.   potassium chloride (KLOR-CON M) 10 MEQ tablet Take 4 tablets (40 mEq total) by mouth 2 (two) times daily. Take extra 4 tablets (40 meq total) when you take PRN metolazone.   spironolactone (ALDACTONE) 25 MG tablet Take 1 tablet (25 mg total) by mouth daily.   torsemide (DEMADEX) 20 MG tablet Take 80 mg by mouth daily.   predniSONE (DELTASONE) 10 MG tablet Take 4 tabs for 3 days, then 3 tabs  for 3 days, then 2 tabs for 3 days, then 1 tab for 3 days, then 1/2 tab for 4 days. (Patient not taking: Reported on 03/27/2023)   No facility-administered encounter medications on file as of 04/11/2023.    Allergies as of 04/11/2023 - Review Complete 04/11/2023  Allergen Reaction Noted   Codeine Nausea And Vomiting 11/22/2011    Past Medical History:  Diagnosis Date   Acute respiratory failure (HCC) 05/05/2018   Acute  systolic congestive heart failure (HCC) 02/03/2018   AICD (automatic cardioverter/defibrillator) present    Allergy    Anxiety    Asthma    DM2 (diabetes mellitus, type 2) (HCC) 10/20/2020   Hypertension    PAF (paroxysmal atrial fibrillation) (HCC)    Presence of permanent cardiac pacemaker    Prophylactic measure 08/03/14-08/19/14   Prophyl. 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   LIMA to LAD, SVG to D1, SVG to OM1, EVH via right thigh with implantation of Impella LD LVAD via direct aortic approach   Small cell lung cancer (HCC) 03/16/2014    Past Surgical History:  Procedure Laterality Date   BRONCHIAL BRUSHINGS  10/25/2020   Procedure: BRONCHIAL BRUSHINGS;  Surgeon: Leslye Peer, MD;  Location: [facility] ENDOSCOPY;  Service: Pulmonary;;   BRONCHIAL NEEDLE ASPIRATION BIOPSY  10/25/2020   Procedure: BRONCHIAL NEEDLE ASPIRATION BIOPSIES;  Surgeon: Leslye Peer, MD;  [facility] ENDOSCOPY;  Service: Pulmonary;;   CARDIAC DEFIBRILLATOR PLACEMENT  08/15/2018   MDT Visia AF MRI VR ICD implanted by Dr Georgena Spurling for primary prevention of sudden   CESAREAN SECTION     CORONARY ARTERY BYPASS GRAFT N/A 02/03/2018   Procedure: CORONARY ARTERY BYPASS GRAFTING (CABG);  Surgeon: Purcell Nails, MD;  Location: [facility] OR;  Service: Open Heart Surgery;  Laterality: N/A;  Time 3 using left internal mammary artery and endoscopically harvested right saphenous vein   CORONARY BALLOON ANGIOPLASTY N/A 02/03/2018   Procedure: CORONARY BALLOON ANGIOPLASTY;  Surgeon: Swaziland, Peter M, MD;  Location: [facility] INVASIVE CV LAB;  Service: Cardiovascular;  Laterality: N/A;   CORONARY STENT INTERVENTION N/A 02/12/2019   Procedure: CORONARY STENT INTERVENTION;  Surgeon: Iran Ouch, MD;  [facility]: [facility] INVASIVE CV LAB;  Service: Cardiovascular;  Laterality: N/A;   CORONARY/GRAFT ACUTE MI REVASCULARIZATION N/A 02/03/2018   Procedure: Coronary/Graft Acute MI Revascularization;  Surgeon: Swaziland, Peter M, MD;   Location: [facility] INVASIVE CV LAB;  Service: Cardiovascular;  Laterality: N/A;   ENDOBRONCHIAL ULTRASOUND N/A 10/25/2020   Procedure: ENDOBRONCHIAL ULTRASOUND;  Surgeon: Leslye Peer, MD;  Location: [facility] ENDOSCOPY;  Service: Pulmonary;  Laterality: N/A;   FLEXIBLE BRONCHOSCOPY  10/25/2020   Procedure: FLEXIBLE BRONCHOSCOPY;  Surgeon: Leslye Peer, MD;  Location: [facility] ENDOSCOPY;  Service: Pulmonary;;   IABP INSERTION N/A 02/03/2018   Procedure: IABP Insertion;  Surgeon: Swaziland, Peter M, MD;  Location: [facility] INVASIVE CV LAB;  Service: Cardiovascular;  Laterality: N/A;   INTRAOPERATIVE TRANSESOPHAGEAL ECHOCARDIOGRAM N/A 02/03/2018   Procedure: INTRAOPERATIVE TRANSESOPHAGEAL ECHOCARDIOGRAM;  Surgeon: Purcell Nails, MD;  Location: [facility] OR;  Service: Open Heart Surgery;  Laterality: N/A;   LEFT HEART CATH AND CORONARY ANGIOGRAPHY N/A 02/03/2018   Procedure: LEFT HEART CATH AND CORONARY ANGIOGRAPHY;  Surgeon: Swaziland, Peter M, MD;  Location: [facility] INVASIVE CV LAB;  Service: Cardiovascular;  Laterality: N/A;   LEFT HEART CATH AND CORS/GRAFTS ANGIOGRAPHY N/A 02/12/2019   Procedure: LEFT HEART CATH AND CORS/GRAFTS ANGIOGRAPHY;  Surgeon: Iran Ouch, MD;  Location: [facility] INVASIVE  CV LAB;  Service: Cardiovascular;  Laterality: N/A;   MEDIASTINOSCOPY N/A 03/11/2014   Procedure: MEDIASTINOSCOPY;  Surgeon: Loreli Slot, MD;  Location: [facility] OR;  Service: Thoracic;  Laterality: N/A;   PLACEMENT OF IMPELLA LEFT VENTRICULAR ASSIST DEVICE  02/03/2018   Procedure: PLACEMENT OF IMPELLA LEFT VENTRICULAR ASSIST DEVICE LD;  Surgeon: Purcell Nails, MD;  [facility] OR;  Service: Open Heart Surgery;;   REMOVAL OF IMPELLA LEFT VENTRICULAR ASSIST DEVICE N/A 02/08/2018   Procedure: REMOVAL OF IMPELLA LEFT VENTRICULAR ASSIST DEVICE;  Surgeon: Purcell Nails, MD;  Location: [facility] OR;  Service: Open Heart Surgery;  Laterality: N/A;   RIGHT HEART CATH N/A 02/03/2018   Procedure: RIGHT HEART CATH;  Surgeon: Swaziland, Peter M, MD;  Location:  [facility] INVASIVE CV LAB;  Service: Cardiovascular;  Laterality: N/A;   RIGHT HEART CATH N/A 05/09/2018   Procedure: RIGHT HEART CATH;  Surgeon: Dolores Patty, MD;  [facility] INVASIVE CV LAB;  Service: Cardiovascular;  Laterality: N/A;   TEE WITHOUT CARDIOVERSION N/A 02/08/2018   Procedure: TRANSESOPHAGEAL ECHOCARDIOGRAM (TEE);  Surgeon: Purcell Nails, MD;  Location: [facility] OR;  Service: Open Heart Surgery;  Laterality: N/A;   TUBAL LIGATION     VIDEO BRONCHOSCOPY WITH ENDOBRONCHIAL ULTRASOUND N/A 03/11/2014   Procedure: VIDEO BRONCHOSCOPY WITH ENDOBRONCHIAL ULTRASOUND;  Surgeon: Loreli Slot, MD;  Location: [facility] OR;  Service: Thoracic;  Laterality: N/A;    Family History  Problem Relation Age of Onset   Heart disease Mother    Hypertension Mother    Heart attack Mother    Hypertension Maternal Grandmother    Cancer Maternal Grandmother    Diabetes Paternal Grandmother    Stroke Neg Hx     Social History   Socioeconomic History   Marital status: Married    Spouse name: Tristan Schroeder    Number of children: 1   Years of education: Not on file   Highest education level: Not on file  Occupational History   Not on file  Tobacco Use   Smoking status: Former    Packs/day: 1.00    Years: 20.00    Additional pack years: 0.00    Total pack years: 20.00    Types: Cigarettes    Quit date: 03/13/2014    Years since quitting: 9.0   Smokeless tobacco: Never  Vaping Use   Vaping Use: Never used  Substance and Sexual Activity   Alcohol use: Yes    Alcohol/week: 5.0 standard drinks of alcohol    Types: 5 Glasses of wine per week   Drug use: Yes    Types: Marijuana    Comment: "medicinal"   Sexual activity: Never  Other Topics Concern   Not on file  Social History Narrative   Not on file   Social Determinants of Health   Financial Resource Strain: Medium Risk (02/11/2021)   Overall Financial Resource Strain (CARDIA)    Difficulty of Paying Living Expenses: Somewhat hard   Food Insecurity: No Food Insecurity (03/01/2023)   Hunger Vital Sign    Worried About Running Out of Food in the Last Year: Never true    Ran Out of Food in the Last Year: Never true  Transportation Needs: Patient Declined (03/01/2023)   PRAPARE - Transportation    Lack of Transportation (Medical): Patient declined    Lack of Transportation (Non-Medical): Patient declined  Physical Activity: Insufficiently Active (02/11/2021)   Exercise Vital Sign    Days of Exercise per Week: 2 days  Minutes of Exercise per Session: 10 min  Stress: Stress Concern Present (02/11/2021)   [employer] of Occupational Health - Occupational Stress Questionnaire    Feeling of Stress : To some extent  Social Connections: Unknown (02/11/2021)   Social Connection and Isolation Panel [NHANES]    Frequency of Communication with Friends and Family: More than three times a week    Frequency of Social Gatherings with Friends and Family: Twice a week    Attends Religious Services: Not on Marketing executive or Organizations: Not on file    Attends Banker Meetings: Never    Marital Status: Married  Catering manager Violence: Not At Risk (03/01/2023)   Humiliation, Afraid, Rape, and Kick questionnaire    Fear of Current or Ex-Partner: No    Emotionally Abused: No    Physically Abused: No    Sexually Abused: No    Review of Systems  Respiratory:  Positive for cough and shortness of breath.     Vitals:   04/11/23 1420  BP: 100/68  Pulse: 90  SpO2: 98%     Physical Exam Constitutional:      Appearance: Normal appearance.  HENT:     Head: Normocephalic.     Mouth/Throat:     Mouth: Mucous membranes are moist.  Eyes:     General: No scleral icterus.    Pupils: Pupils are equal, round, and reactive to light.  Cardiovascular:     Rate and Rhythm: Normal rate and regular rhythm.     Heart sounds: No murmur heard.    No friction rub.  Pulmonary:     Effort: No respiratory  distress.     Breath sounds: No stridor. No wheezing or rhonchi.     Comments: Poor air movement bilaterally Musculoskeletal:     Cervical back: No rigidity or tenderness.  Neurological:     Mental Status: She is alert.  Psychiatric:        Mood and Affect: Mood normal.      Data Reviewed: PFT from 2022 reviewed showing stage II obstructive lung disease  Echocardiogram results noted  Reviewed Dr. Prescott Gum notes from March 2024  Last office notes by Dr. Shirline Frees from 2021 reviewed   Assessment:  Stage II COPD -  Acute on chronic systolic heart failure -Follows up with cardiology on regular basis -NYHA IIIb -On optimize diuretics -Has Barostim in place  Coronary artery disease  Paroxysmal atrial fibrillation  Past history of small cell lung cancer  Ischemic cardiomyopathy  History of asthma  Nocturnal hypoxemia  Intermittent hemoptysis  History of renal infarct -On Eliquis  Plan/Recommendations: Schedule patient for CT scan with contrast  Schedule for pulmonary function test  Samples of Trelegy provided  Prescription for Trelegy provided  Graded activities as tolerated recommended  I will see her back in about 2 to 3 months   Virl Diamond MD  Pulmonary and Critical Care 04/11/2023, 2:50 PM  CC: Sherald Hess, NP

## 2023-04-16 ENCOUNTER — Encounter (HOSPITAL_COMMUNITY): Payer: BC Managed Care – PPO

## 2023-04-17 ENCOUNTER — Telehealth (HOSPITAL_COMMUNITY): Payer: Self-pay | Admitting: Emergency Medicine

## 2023-04-17 ENCOUNTER — Other Ambulatory Visit (HOSPITAL_COMMUNITY): Payer: Self-pay

## 2023-04-17 ENCOUNTER — Other Ambulatory Visit (HOSPITAL_COMMUNITY): Payer: Self-pay | Admitting: Emergency Medicine

## 2023-04-17 DIAGNOSIS — J9611 Chronic respiratory failure with hypoxia: Secondary | ICD-10-CM

## 2023-04-17 DIAGNOSIS — I480 Paroxysmal atrial fibrillation: Secondary | ICD-10-CM

## 2023-04-17 MED ORDER — APIXABAN 2.5 MG PO TABS: 2.5000 mg | ORAL_TABLET | Freq: Two times a day (BID) | ORAL | 1 refills | Status: DC

## 2023-04-17 NOTE — Telephone Encounter (Signed)
Called to determine the status of her Eliquis prescription.  Pharmacy advises they are waiting for a new script from the provider for a 90 day supply so that her insurance will cover it.  Pharmacy advised they reached out to provider and have not yet heard back regarding same.  I will reach out to try and expedite this script.    [name], EMT-Paramedic [phone] 04/17/2023

## 2023-04-17 NOTE — Progress Notes (Signed)
Paramedicine Encounter    Patient ID: Brittany [name], female    DOB: 03/03/1961, 62 y.o.   MRN: [id number]   Complaints NONE   Assessment  A&O x 4 skin W&D w/ good color. Lung sounds clear throughout.  NO edema noted.  Compliance with meds She states she is compliant but needs refills on her Eliquis.  Pill box filled N/A  Pt. Takes her own meds from bottle  Refills needed Eliquis 5mg  BID  Meds changes since last visit  NONE    Social changes NONE   BP 128/80 (BP Location: Left Arm, Patient Position: Sitting, Cuff Size: Small)   Pulse 100   Resp 16   Wt 96 lb (43.5 kg)   SpO2 99%   BMI 19.39 kg/m  Weight yesterday- not taken Last visit weight-97lb  Today she notes that she has been taking Trelegy and that she feels this med has helped her breathing tremendously.  She states she feels like her breathing is much better and has rarely had to use her albuterol inhaler.  Pt states she is waiting for refill on her Eliquis.  I called her pharmacy who advised that she needs a prescription for a 3 month supply in order for her insurance to cover it.  Message sent to HF Triage to request same.    ACTION: Home visit completed  Bethanie Dicker [phone] 04/17/23  Patient Care Team: Pcp, No as PCP - Kevin Fenton, MD (Inactive) as PCP - Electrophysiology (Cardiology) Bensimhon, Bevelyn Buckles, MD as PCP - Advanced Heart Failure (Cardiology)  Patient Active Problem List   Diagnosis Date Noted   Presence of heart assist device (HCC) 04/11/2023   COPD with acute exacerbation (HCC) 03/01/2023   CAD (coronary artery disease) 03/01/2023   Acute respiratory failure with hypoxia (HCC) 11/14/2022   Sepsis (HCC) 11/14/2022   Influenza A with pneumonia 11/14/2022   Elevated troponin 11/14/2022   History of lung cancer 11/14/2022   Acute combined systolic and diastolic CHF, NYHA class 4 (HCC) 07/09/2022   Malnutrition of moderate degree 06/20/2022   Lactic  acidosis 04/12/2022   Prolonged QT interval 04/09/2022   Hypotension 04/09/2022   Chronic HFrEF (heart failure with reduced ejection fraction) (HCC) 12/11/2021   CHF exacerbation (HCC) 12/10/2021   Renal infarct (HCC) 12/10/2021   COPD exacerbation (HCC) 09/29/2021   Mixed diabetic hyperlipidemia associated with type 2 diabetes mellitus (HCC) 09/29/2021   CKD (chronic kidney disease) stage 3, GFR 30-59 ml/min (HCC) 09/29/2021   Unstable angina (HCC) 02/09/2021   Hemoptysis 10/20/2020   Type 2 diabetes mellitus with stage 3a chronic kidney disease, without long-term current use of insulin (HCC) 10/20/2020   Acute on chronic combined systolic and diastolic CHF (congestive heart failure) (HCC) 05/21/2020   Ischemic cardiomyopathy 04/12/2020   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   NSTEMI (non-ST elevated myocardial infarction) (HCC) 02/12/2019   CHF (congestive heart failure) (HCC) 11/24/2018   Recurrent pleural effusion on right 05/05/2018   Acute on chronic respiratory failure with hypoxia (HCC) 05/05/2018   Chronic respiratory failure with hypoxia (HCC)    Asthma    Anxiety    Allergy    HCAP (healthcare-associated pneumonia) 03/15/2015   Chest pain 03/15/2015   Small cell lung cancer (HCC) 03/16/2014   Protein-calorie malnutrition, severe (HCC) 03/14/2014   AF (paroxysmal atrial fibrillation) (HCC) 03/12/2014   Essential hypertension 05/07/2012    Current Outpatient Medications:    albuterol (VENTOLIN HFA) 108 (90 Base) MCG/ACT inhaler, Inhale 2  puffs into the lungs every 6 (six) hours as needed for wheezing or shortness of breath., Disp: 8.5 g, Rfl: 1   atorvastatin (LIPITOR) 80 MG tablet, Take 1 tablet (80 mg total) by mouth daily at 6 PM., Disp: 30 tablet, Rfl: 1   blood glucose meter kit and supplies KIT, Dispense based on patient and insurance preference. Use up to four times daily as directed., Disp: 1 each, Rfl: 0   dapagliflozin propanediol  (FARXIGA) 10 MG TABS tablet, Take 1 tablet by mouth daily., Disp: 30 tablet, Rfl: 11   digoxin (LANOXIN) 0.125 MG tablet, Take 1 tablet (0.125 mg total) by mouth daily., Disp: 30 tablet, Rfl: 3   Fluticasone-Umeclidin-Vilant (TRELEGY ELLIPTA) 200-62.5-25 MCG/ACT AEPB, Inhale 1 puff into the lungs daily., Disp: 60 each, Rfl: 5   naphazoline-pheniramine (VISINE) 0.025-0.3 % ophthalmic solution, Place 1 drop into both eyes 4 (four) times daily as needed for eye irritation., Disp: , Rfl:    nitroGLYCERIN (NITROSTAT) 0.4 MG SL tablet, Place 1 tablet (0.4 mg total) under the tongue every 5 (five) minutes as needed for chest pain., Disp: 90 tablet, Rfl: 5   Nutritional Supplements (ENSURE ORIGINAL) LIQD, Take 2 Bottles by mouth daily., Disp: , Rfl:    spironolactone (ALDACTONE) 25 MG tablet, Take 1 tablet (25 mg total) by mouth daily., Disp: 90 tablet, Rfl: 3   torsemide (DEMADEX) 20 MG tablet, Take 80 mg by mouth daily., Disp: , Rfl:    apixaban (ELIQUIS) 5 MG TABS tablet, Take 1 tablet (5 mg total) by mouth 2 (two) times daily. (Patient not taking: Reported on 04/17/2023), Disp: 60 tablet, Rfl: 1   ipratropium-albuterol (DUONEB) 0.5-2.5 (3) MG/3ML SOLN, Take 3 mLs by nebulization every 4 (four) hours as needed (for shortness of breath)., Disp: 90 mL, Rfl: 3   ivabradine (CORLANOR) 7.5 MG TABS tablet, Take 1 tablet (7.5 mg total) by mouth 2 (two) times daily with a meal. TAKE 1 TABLET (7.5 MG TOTAL) BY MOUTH 2 (TWO) TIMES DAILY WITH A MEAL., Disp: 180 tablet, Rfl: 1   metolazone (ZAROXOLYN) 2.5 MG tablet, Take 1 tablet (2.5 mg total) by mouth as directed., Disp: 15 tablet, Rfl: 1   Multiple Vitamin (MULTIVITAMIN WITH MINERALS) TABS tablet, Take 1 tablet by mouth daily., Disp: , Rfl:    potassium chloride (KLOR-CON M) 10 MEQ tablet, Take 4 tablets (40 mEq total) by mouth 2 (two) times daily. Take extra 4 tablets (40 meq total) when you take PRN metolazone., Disp: 285 tablet, Rfl: 5   predniSONE (DELTASONE) 10  MG tablet, Take 4 tabs for 3 days, then 3 tabs for 3 days, then 2 tabs for 3 days, then 1 tab for 3 days, then 1/2 tab for 4 days. (Patient not taking: Reported on 03/27/2023), Disp: 32 tablet, Rfl: 0 Allergies  Allergen Reactions   Codeine Nausea And Vomiting     Social History   Socioeconomic History   Marital status: Married    Spouse name: Tristan Schroeder    Number of children: 1   Years of education: Not on file   Highest education level: Not on file  Occupational History   Not on file  Tobacco Use   Smoking status: Former    Packs/day: 1.00    Years: 20.00    Additional pack years: 0.00    Total pack years: 20.00    Types: Cigarettes    Quit date: 03/13/2014    Years since quitting: 9.1   Smokeless tobacco: Never  Vaping Use  Vaping Use: Never used  Substance and Sexual Activity   Alcohol use: Yes    Alcohol/week: 5.0 standard drinks of alcohol    Types: 5 Glasses of wine per week   Drug use: Yes    Types: Marijuana    Comment: "medicinal"   Sexual activity: Never  Other Topics Concern   Not on file  Social History Narrative   Not on file   Social Determinants of Health   Financial Resource Strain: Medium Risk (02/11/2021)   Overall Financial Resource Strain (CARDIA)    Difficulty of Paying Living Expenses: Somewhat hard  Food Insecurity: No Food Insecurity (03/01/2023)   Hunger Vital Sign    Worried About Running Out of Food in the Last Year: Never true    Ran Out of Food in the Last Year: Never true  Transportation Needs: Patient Declined (03/01/2023)   PRAPARE - Transportation    Lack of Transportation (Medical): Patient declined    Lack of Transportation (Non-Medical): Patient declined  Physical Activity: Insufficiently Active (02/11/2021)   Exercise Vital Sign    Days of Exercise per Week: 2 days    Minutes of Exercise per Session: 10 min  Stress: Stress Concern Present (02/11/2021)   [employer] of Occupational Health - Occupational Stress  Questionnaire    Feeling of Stress : To some extent  Social Connections: Unknown (02/11/2021)   Social Connection and Isolation Panel [NHANES]    Frequency of Communication with Friends and Family: More than three times a week    Frequency of Social Gatherings with Friends and Family: Twice a week    Attends Religious Services: Not on file    Active Member of Clubs or Organizations: Not on file    Attends Banker Meetings: Never    Marital Status: Married  Catering manager Violence: Not At Risk (03/01/2023)   Humiliation, Afraid, Rape, and Kick questionnaire    Fear of Current or Ex-Partner: No    Emotionally Abused: No    Physically Abused: No    Sexually Abused: No    Physical Exam      Future Appointments  Date Time Provider Department Center  04/23/2023  7:25 AM [facility] DEVICE REMOTES [facility]  05/02/2023 12:00 PM [facility] PA/NP [facility] None  05/02/2023  3:00 PM [facility] 2 [facility]  06/18/2023 11:00 AM [facility] RM [facility] None  06/18/2023  1:30 PM Olalere, Adewale A, MD [facility] None  06/20/2023 12:40 PM [facility] DEVICE REMOTES [facility]  09/19/2023 12:40 PM [facility] DEVICE REMOTES [facility]  12/19/2023 12:40 PM [facility] DEVICE REMOTES [facility]  03/19/2024 12:40 PM [facility] DEVICE REMOTES [facility]  06/18/2024 12:40 PM [facility] DEVICE REMOTES [facility]

## 2023-04-17 NOTE — Telephone Encounter (Signed)
Sent her a  message to let her know that her 90 day supply of Eliquis has been called in as requested.    [name], EMT-Paramedic [phone] 04/17/2023

## 2023-04-18 ENCOUNTER — Telehealth (HOSPITAL_COMMUNITY): Payer: Self-pay | Admitting: Licensed Clinical Social Worker

## 2023-04-18 ENCOUNTER — Emergency Department (HOSPITAL_COMMUNITY): Payer: BC Managed Care – PPO

## 2023-04-18 ENCOUNTER — Other Ambulatory Visit: Payer: Self-pay

## 2023-04-18 ENCOUNTER — Inpatient Hospital Stay (HOSPITAL_COMMUNITY)
Admission: EM | Admit: 2023-04-18 | Discharge: 2023-04-22 | DRG: 280 | Disposition: A | Discharge status: Home / Self Care | Payer: BC Managed Care – PPO | Attending: Internal Medicine | Admitting: Internal Medicine

## 2023-04-18 ENCOUNTER — Encounter (HOSPITAL_COMMUNITY): Payer: Self-pay

## 2023-04-18 DIAGNOSIS — K219 Gastro-esophageal reflux disease without esophagitis: Secondary | ICD-10-CM | POA: Diagnosis present

## 2023-04-18 DIAGNOSIS — E785 Hyperlipidemia, unspecified: Secondary | ICD-10-CM | POA: Diagnosis present

## 2023-04-18 DIAGNOSIS — N12 Tubulo-interstitial nephritis, not specified as acute or chronic: Secondary | ICD-10-CM

## 2023-04-18 DIAGNOSIS — D696 Thrombocytopenia, unspecified: Secondary | ICD-10-CM | POA: Diagnosis present

## 2023-04-18 DIAGNOSIS — Z951 Presence of aortocoronary bypass graft: Secondary | ICD-10-CM

## 2023-04-18 DIAGNOSIS — F419 Anxiety disorder, unspecified: Secondary | ICD-10-CM | POA: Diagnosis present

## 2023-04-18 DIAGNOSIS — N1831 Chronic kidney disease, stage 3a: Secondary | ICD-10-CM | POA: Diagnosis present

## 2023-04-18 DIAGNOSIS — D72829 Elevated white blood cell count, unspecified: Secondary | ICD-10-CM | POA: Diagnosis present

## 2023-04-18 DIAGNOSIS — R7989 Other specified abnormal findings of blood chemistry: Secondary | ICD-10-CM | POA: Diagnosis present

## 2023-04-18 DIAGNOSIS — Z79899 Other long term (current) drug therapy: Secondary | ICD-10-CM

## 2023-04-18 DIAGNOSIS — E1122 Type 2 diabetes mellitus with diabetic chronic kidney disease: Secondary | ICD-10-CM | POA: Diagnosis present

## 2023-04-18 DIAGNOSIS — Z85118 Personal history of other malignant neoplasm of bronchus and lung: Secondary | ICD-10-CM

## 2023-04-18 DIAGNOSIS — E440 Moderate protein-calorie malnutrition: Secondary | ICD-10-CM | POA: Diagnosis present

## 2023-04-18 DIAGNOSIS — I2510 Atherosclerotic heart disease of native coronary artery without angina pectoris: Secondary | ICD-10-CM | POA: Diagnosis present

## 2023-04-18 DIAGNOSIS — Z8249 Family history of ischemic heart disease and other diseases of the circulatory system: Secondary | ICD-10-CM

## 2023-04-18 DIAGNOSIS — I480 Paroxysmal atrial fibrillation: Secondary | ICD-10-CM | POA: Diagnosis present

## 2023-04-18 DIAGNOSIS — J449 Chronic obstructive pulmonary disease, unspecified: Secondary | ICD-10-CM | POA: Diagnosis present

## 2023-04-18 DIAGNOSIS — R109 Unspecified abdominal pain: Principal | ICD-10-CM | POA: Diagnosis present

## 2023-04-18 DIAGNOSIS — I5042 Chronic combined systolic (congestive) and diastolic (congestive) heart failure: Secondary | ICD-10-CM | POA: Diagnosis present

## 2023-04-18 DIAGNOSIS — Z5986 Financial insecurity: Secondary | ICD-10-CM

## 2023-04-18 DIAGNOSIS — J9621 Acute and chronic respiratory failure with hypoxia: Secondary | ICD-10-CM | POA: Diagnosis present

## 2023-04-18 DIAGNOSIS — I5022 Chronic systolic (congestive) heart failure: Secondary | ICD-10-CM | POA: Diagnosis present

## 2023-04-18 DIAGNOSIS — Z833 Family history of diabetes mellitus: Secondary | ICD-10-CM

## 2023-04-18 DIAGNOSIS — Z7982 Long term (current) use of aspirin: Secondary | ICD-10-CM

## 2023-04-18 DIAGNOSIS — Z95810 Presence of automatic (implantable) cardiac defibrillator: Secondary | ICD-10-CM | POA: Diagnosis present

## 2023-04-18 DIAGNOSIS — Z885 Allergy status to narcotic agent status: Secondary | ICD-10-CM

## 2023-04-18 DIAGNOSIS — Z6820 Body mass index (BMI) 20.0-20.9, adult: Secondary | ICD-10-CM

## 2023-04-18 DIAGNOSIS — N183 Chronic kidney disease, stage 3 unspecified: Secondary | ICD-10-CM | POA: Diagnosis present

## 2023-04-18 DIAGNOSIS — I130 Hypertensive heart and chronic kidney disease with heart failure and stage 1 through stage 4 chronic kidney disease, or unspecified chronic kidney disease: Secondary | ICD-10-CM | POA: Diagnosis present

## 2023-04-18 DIAGNOSIS — R9431 Abnormal electrocardiogram [ECG] [EKG]: Secondary | ICD-10-CM | POA: Diagnosis present

## 2023-04-18 DIAGNOSIS — E860 Dehydration: Secondary | ICD-10-CM | POA: Diagnosis present

## 2023-04-18 DIAGNOSIS — Z87891 Personal history of nicotine dependence: Secondary | ICD-10-CM

## 2023-04-18 DIAGNOSIS — E739 Lactose intolerance, unspecified: Secondary | ICD-10-CM | POA: Diagnosis present

## 2023-04-18 DIAGNOSIS — I214 Non-ST elevation (NSTEMI) myocardial infarction: Secondary | ICD-10-CM | POA: Diagnosis not present

## 2023-04-18 DIAGNOSIS — N1832 Chronic kidney disease, stage 3b: Secondary | ICD-10-CM | POA: Diagnosis present

## 2023-04-18 DIAGNOSIS — Z7901 Long term (current) use of anticoagulants: Secondary | ICD-10-CM

## 2023-04-18 DIAGNOSIS — A599 Trichomoniasis, unspecified: Secondary | ICD-10-CM | POA: Diagnosis present

## 2023-04-18 DIAGNOSIS — Z87442 Personal history of urinary calculi: Secondary | ICD-10-CM

## 2023-04-18 DIAGNOSIS — N179 Acute kidney failure, unspecified: Secondary | ICD-10-CM | POA: Diagnosis present

## 2023-04-18 DIAGNOSIS — I10 Essential (primary) hypertension: Secondary | ICD-10-CM | POA: Diagnosis present

## 2023-04-18 DIAGNOSIS — E875 Hyperkalemia: Secondary | ICD-10-CM | POA: Diagnosis present

## 2023-04-18 LAB — MAGNESIUM: Magnesium: 2.3 mg/dL (ref 1.7–2.4)

## 2023-04-18 LAB — CBC WITH DIFFERENTIAL/PLATELET
Abs Immature Granulocytes: 0.05 10*3/uL (ref 0.00–0.07)
Basophils Absolute: 0.1 10*3/uL (ref 0.0–0.1)
Basophils Relative: 1 %
Eosinophils Absolute: 0 10*3/uL (ref 0.0–0.5)
Eosinophils Relative: 0 %
HCT: 49.3 % — ABNORMAL HIGH (ref 36.0–46.0)
Hemoglobin: 15.6 g/dL — ABNORMAL HIGH (ref 12.0–15.0)
Immature Granulocytes: 0 %
Lymphocytes Relative: 9 %
Lymphs Abs: 1.1 10*3/uL (ref 0.7–4.0)
MCH: 28.4 pg (ref 26.0–34.0)
MCHC: 31.6 g/dL (ref 30.0–36.0)
MCV: 89.8 fL (ref 80.0–100.0)
Monocytes Absolute: 0.9 10*3/uL (ref 0.1–1.0)
Monocytes Relative: 7 %
Neutro Abs: 10.3 10*3/uL — ABNORMAL HIGH (ref 1.7–7.7)
Neutrophils Relative %: 83 %
Platelets: 259 10*3/uL (ref 150–400)
RBC: 5.49 MIL/uL — ABNORMAL HIGH (ref 3.87–5.11)
RDW: 20.7 % — ABNORMAL HIGH (ref 11.5–15.5)
WBC: 12.5 10*3/uL — ABNORMAL HIGH (ref 4.0–10.5)
nRBC: 0 % (ref 0.0–0.2)

## 2023-04-18 LAB — COMPREHENSIVE METABOLIC PANEL
ALT: 28 U/L (ref 0–44)
AST: 73 U/L — ABNORMAL HIGH (ref 15–41)
Albumin: 3.8 g/dL (ref 3.5–5.0)
Alkaline Phosphatase: 76 U/L (ref 38–126)
Anion gap: 11 (ref 5–15)
BUN: 27 mg/dL — ABNORMAL HIGH (ref 8–23)
CO2: 23 mmol/L (ref 22–32)
Calcium: 9.2 mg/dL (ref 8.9–10.3)
Chloride: 101 mmol/L (ref 98–111)
Creatinine, Ser: 1.46 mg/dL — ABNORMAL HIGH (ref 0.44–1.00)
GFR, Estimated: 40 mL/min — ABNORMAL LOW (ref >60)
Glucose, Bld: 117 mg/dL — ABNORMAL HIGH (ref 70–99)
Potassium: 5.3 mmol/L — ABNORMAL HIGH (ref 3.5–5.1)
Sodium: 135 mmol/L (ref 135–145)
Total Bilirubin: 0.6 mg/dL (ref 0.3–1.2)
Total Protein: 7.1 g/dL (ref 6.5–8.1)

## 2023-04-18 LAB — TROPONIN I (HIGH SENSITIVITY): Troponin I (High Sensitivity): 325 ng/L (ref <18)

## 2023-04-18 LAB — LIPASE, BLOOD: Lipase: 30 U/L (ref 11–51)

## 2023-04-18 LAB — BRAIN NATRIURETIC PEPTIDE: B Natriuretic Peptide: 1203.6 pg/mL — ABNORMAL HIGH (ref 0.0–100.0)

## 2023-04-18 MED ORDER — FENTANYL CITRATE PF 50 MCG/ML IJ SOSY: 50.0000 ug | PREFILLED_SYRINGE | Freq: Once | INTRAMUSCULAR | Status: DC

## 2023-04-18 MED ORDER — HYDROMORPHONE HCL 1 MG/ML IJ SOLN
0.5000 mg | Freq: Once | INTRAMUSCULAR | Status: AC
  Administered 2023-04-18: 0.5 mg via INTRAVENOUS
  Filled 2023-04-18: qty 1

## 2023-04-18 NOTE — ED Triage Notes (Signed)
Patient bib [facility] with complaints of left sided flank pain, nausea and vomiting since noon today. Patient denies chest pain but states she has had heartburn. Patient has a history of MI, heart failure, COPD. Patient has a defibrillator she states it is [employer].

## 2023-04-18 NOTE — Telephone Encounter (Signed)
HF Paramedicine Team Based Care Meeting  One Month Post Enrollment Assessment  HF MD- NA  HF NP - Amy Clegg NP-C   [facility] HF Paramedicine  Katie Vicente Males  [facility] admit within the last 30 days for heart failure?   Medications concerns? Does her own medications- won't use pill box and resistant to paramedic reviewing with her  Transportation issues ? none  Education needs? none  SDOH concerns? none  [facility] Referrals Placed:none needed  Barriers to discharge? Newer referral to avoid readmission -paramedic not doing much for her at this time so will consider for DC soon.     Burna Sis, LCSW Clinical Social Worker Advanced Heart Failure [facility] Desk#: [phone] Cell#: [phone]

## 2023-04-18 NOTE — ED Provider Notes (Signed)
[place] EMERGENCY DEPARTMENT AT [facility] Provider Note   CSN: [id number] Arrival date & time: 04/18/23  2217     History  Chief Complaint  Patient presents with   Flank Pain    Brittany [name] is a 62 y.o. female.   Flank Pain Associated symptoms include chest pain and shortness of breath.  Patient presents for left flank pain, nausea, and vomiting.  She states that she feels some fatigue yesterday but was otherwise in her normal state of health.  This morning, she began to experience left flank pain.  Pain has been persistent since that time.  At noon, she developed nausea, vomiting, and p.o. intolerance.  This has persisted throughout the afternoon and evening.  She ultimately called EMS.  EMS gave 4 mg of Zofran prior to arrival.  Patient reports that this has improved her nausea.  She does continue to have left flank pain.  She denies any recent injuries.  She has had some mild chest discomfort and some worsening shortness of breath today.  She has a complex medical history that includes small cell lung cancer, COPD, T2DM, CKD, CHF, HTN, atrial fibrillation, CAD, HLD.  She has a defibrillator and vagal nerve stimulator.  She is followed by Dr. [name] for her CHF.  She has recently establish care with pulmonology for treatment of her COPD.     Home Medications Prior to Admission medications   Medication Sig Start Date End Date Taking? Authorizing Provider  albuterol (VENTOLIN HFA) 108 (90 Base) MCG/ACT inhaler Inhale 2 puffs into the lungs every 6 (six) hours as needed for wheezing or shortness of breath. 06/20/22   Zannie Cove, MD  apixaban (ELIQUIS) 2.5 MG TABS tablet Take 1 tablet (2.5 mg total) by mouth 2 (two) times daily. 04/17/23   Clegg, Amy D, NP  atorvastatin (LIPITOR) 80 MG tablet Take 1 tablet (80 mg total) by mouth daily at 6 PM. 04/16/22   Hongalgi, Maximino Greenland, MD  blood glucose meter kit and supplies KIT Dispense based on patient and insurance  preference. Use up to four times daily as directed. 04/11/21   Osvaldo Shipper, MD  dapagliflozin propanediol (FARXIGA) 10 MG TABS tablet Take 1 tablet by mouth daily. 09/04/22   Bensimhon, Bevelyn Buckles, MD  digoxin (LANOXIN) 0.125 MG tablet Take 1 tablet (0.125 mg total) by mouth daily. 03/28/23   Jacklynn Ganong, FNP  Fluticasone-Umeclidin-Vilant (TRELEGY ELLIPTA) 200-62.5-25 MCG/ACT AEPB Inhale 1 puff into the lungs daily. 04/11/23   Olalere, Onnie Boer A, MD  ipratropium-albuterol (DUONEB) 0.5-2.5 (3) MG/3ML SOLN Take 3 mLs by nebulization every 4 (four) hours as needed (for shortness of breath). 07/12/22   Laurey Morale, MD  ivabradine (CORLANOR) 7.5 MG TABS tablet Take 1 tablet (7.5 mg total) by mouth 2 (two) times daily with a meal. TAKE 1 TABLET (7.5 MG TOTAL) BY MOUTH 2 (TWO) TIMES DAILY WITH A MEAL. 10/10/22   Bensimhon, Bevelyn Buckles, MD  metolazone (ZAROXOLYN) 2.5 MG tablet Take 1 tablet (2.5 mg total) by mouth as directed. 03/07/23   Bensimhon, Bevelyn Buckles, MD  Multiple Vitamin (MULTIVITAMIN WITH MINERALS) TABS tablet Take 1 tablet by mouth daily. 12/15/21   Hongalgi, Maximino Greenland, MD  naphazoline-pheniramine (VISINE) 0.025-0.3 % ophthalmic solution Place 1 drop into both eyes 4 (four) times daily as needed for eye irritation.    [provider]  nitroGLYCERIN (NITROSTAT) 0.4 MG SL tablet Place 1 tablet (0.4 mg total) under the tongue every 5 (five) minutes as needed for  chest pain. 02/14/19   Graciella Freer, PA-C  Nutritional Supplements (ENSURE ORIGINAL) LIQD Take 2 Bottles by mouth daily.    [provider]  potassium chloride (KLOR-CON M) 10 MEQ tablet Take 4 tablets (40 mEq total) by mouth 2 (two) times daily. Take extra 4 tablets (40 meq total) when you take PRN metolazone. 01/15/23   [name], Anderson Malta, FNP  predniSONE (DELTASONE) 10 MG tablet Take 4 tabs for 3 days, then 3 tabs for 3 days, then 2 tabs for 3 days, then 1 tab for 3 days, then 1/2 tab for 4 days. Patient not taking:  Reported on 03/27/2023 03/03/23   Noralee Stain, DO  spironolactone (ALDACTONE) 25 MG tablet Take 1 tablet (25 mg total) by mouth daily. 03/28/23   Jacklynn Ganong, FNP  torsemide (DEMADEX) 20 MG tablet Take 80 mg by mouth daily.    [provider]      Allergies    Codeine    Review of Systems   Review of Systems  Constitutional:  Positive for fatigue.  Respiratory:  Positive for shortness of breath.   Cardiovascular:  Positive for chest pain.  Gastrointestinal:  Positive for nausea and vomiting.  Genitourinary:  Positive for flank pain.    Physical Exam Updated Vital Signs BP 106/61   Pulse 93   Temp 98.2 F (36.8 C)   Resp 20   SpO2 95%  Physical Exam Vitals and nursing note reviewed.  Constitutional:      General: She is not in acute distress.    Appearance: Normal appearance. She is well-developed. She is not toxic-appearing or diaphoretic.  HENT:     Head: Normocephalic and atraumatic.     Right Ear: External ear normal.     Left Ear: External ear normal.     Nose: Nose normal.     Mouth/Throat:     Mouth: Mucous membranes are moist.  Eyes:     Extraocular Movements: Extraocular movements intact.     Conjunctiva/sclera: Conjunctivae normal.  Cardiovascular:     Rate and Rhythm: Normal rate and regular rhythm.     Heart sounds: No murmur heard. Pulmonary:     Effort: Pulmonary effort is normal. No respiratory distress.     Breath sounds: Normal breath sounds. No wheezing or rales.  Chest:     Chest wall: No tenderness.  Abdominal:     General: There is no distension.     Palpations: Abdomen is soft.     Tenderness: There is abdominal tenderness (Tenderness to left flank).  Musculoskeletal:        General: No swelling. Normal range of motion.     Cervical back: Normal range of motion and neck supple.     Right lower leg: No edema.     Left lower leg: No edema.  Skin:    General: Skin is warm and dry.     Coloration: Skin is not jaundiced or  pale.  Neurological:     General: No focal deficit present.     Mental Status: She is alert and oriented to person, place, and time.  Psychiatric:        Mood and Affect: Mood normal.        Behavior: Behavior normal.        Thought Content: Thought content normal.        Judgment: Judgment normal.     ED Results / Procedures / Treatments   Labs (all labs ordered are listed, but only  abnormal results are displayed) Labs Reviewed  COMPREHENSIVE METABOLIC PANEL  LIPASE, BLOOD  CBC WITH DIFFERENTIAL/PLATELET  URINALYSIS, ROUTINE W REFLEX MICROSCOPIC  MAGNESIUM  BRAIN NATRIURETIC PEPTIDE  TROPONIN I (HIGH SENSITIVITY)    EKG None  Radiology No results found.  Procedures Procedures    Medications Ordered in ED Medications  HYDROmorphone (DILAUDID) injection 0.5 mg (0.5 mg Intravenous Given 04/18/23 2241)    ED Course/ Medical Decision Making/ A&P                             Medical Decision Making Amount and/or Complexity of Data Reviewed Labs: ordered. Radiology: ordered.  Risk Prescription drug management.   This patient presents to the ED for concern of left flank pain, this involves an extensive number of treatment options, and is a complaint that carries with it a high risk of complications and morbidity.  The differential diagnosis includes nephrolithiasis, retroperitoneal hemorrhage, renal infarct, colitis, SBO   Co morbidities that complicate the patient evaluation  small cell lung cancer, COPD, T2DM, CKD, CHF, HTN, atrial fibrillation, CAD, HLD   Additional history obtained:  Additional history obtained from EMS External records from outside source obtained and reviewed including EMR   Lab Tests:  I Ordered, and personally interpreted labs.  The pertinent results include: (Pending at time of signout)   Imaging Studies ordered:  I ordered imaging studies including chest x-ray, CT stone study I independently visualized and interpreted imaging  which showed (pending at time of signout) I agree with the radiologist interpretation   Cardiac Monitoring: / EKG:  The patient was maintained on a cardiac monitor.  I personally viewed and interpreted the cardiac monitored which showed an underlying rhythm of: Sinus rhythm   Problem List / ED Course / Critical interventions / Medication management  Patient presents for left flank pain, nausea, and vomiting.  Symptoms have been persistent and severe throughout the day today.  She also endorses some mild chest discomfort and some shortness of breath.  Her nausea has improved following 4 mg of Zofran provided by EMS.  On arrival in the ED, patient is alert and oriented.  She does appear uncomfortable.  She has tenderness to area of left flank.  No skin changes are noted.  Dilaudid was ordered for analgesia.  Diagnostic workup was initiated.  At time of signout, results of workup are pending.  Care of patient signed out to oncoming ED provider. I ordered medication including Dilaudid for analgesia Reevaluation of the patient after these medicines showed that the patient improved I have reviewed the patients home medicines and have made adjustments as needed   Social Determinants of Health:  Has access to outpatient care        Final Clinical Impression(s) / ED Diagnoses Final diagnoses:  Left flank pain    Rx / DC Orders ED Discharge Orders     None         Gloris Manchester, MD 04/18/23 2255

## 2023-04-19 ENCOUNTER — Inpatient Hospital Stay (HOSPITAL_COMMUNITY): Payer: BC Managed Care – PPO

## 2023-04-19 ENCOUNTER — Other Ambulatory Visit (HOSPITAL_COMMUNITY): Payer: BC Managed Care – PPO

## 2023-04-19 ENCOUNTER — Encounter (HOSPITAL_COMMUNITY): Payer: Self-pay | Admitting: Internal Medicine

## 2023-04-19 DIAGNOSIS — Z95810 Presence of automatic (implantable) cardiac defibrillator: Secondary | ICD-10-CM | POA: Diagnosis not present

## 2023-04-19 DIAGNOSIS — I5022 Chronic systolic (congestive) heart failure: Secondary | ICD-10-CM | POA: Diagnosis not present

## 2023-04-19 DIAGNOSIS — I5042 Chronic combined systolic (congestive) and diastolic (congestive) heart failure: Secondary | ICD-10-CM | POA: Diagnosis present

## 2023-04-19 DIAGNOSIS — E440 Moderate protein-calorie malnutrition: Secondary | ICD-10-CM | POA: Diagnosis present

## 2023-04-19 DIAGNOSIS — N179 Acute kidney failure, unspecified: Secondary | ICD-10-CM | POA: Diagnosis present

## 2023-04-19 DIAGNOSIS — Z951 Presence of aortocoronary bypass graft: Secondary | ICD-10-CM | POA: Diagnosis not present

## 2023-04-19 DIAGNOSIS — I10 Essential (primary) hypertension: Secondary | ICD-10-CM | POA: Diagnosis not present

## 2023-04-19 DIAGNOSIS — I130 Hypertensive heart and chronic kidney disease with heart failure and stage 1 through stage 4 chronic kidney disease, or unspecified chronic kidney disease: Secondary | ICD-10-CM | POA: Diagnosis present

## 2023-04-19 DIAGNOSIS — J9621 Acute and chronic respiratory failure with hypoxia: Secondary | ICD-10-CM | POA: Diagnosis present

## 2023-04-19 DIAGNOSIS — I214 Non-ST elevation (NSTEMI) myocardial infarction: Secondary | ICD-10-CM

## 2023-04-19 DIAGNOSIS — E860 Dehydration: Secondary | ICD-10-CM | POA: Diagnosis present

## 2023-04-19 DIAGNOSIS — E1122 Type 2 diabetes mellitus with diabetic chronic kidney disease: Secondary | ICD-10-CM | POA: Diagnosis present

## 2023-04-19 DIAGNOSIS — A599 Trichomoniasis, unspecified: Secondary | ICD-10-CM | POA: Diagnosis present

## 2023-04-19 DIAGNOSIS — Z87891 Personal history of nicotine dependence: Secondary | ICD-10-CM | POA: Diagnosis not present

## 2023-04-19 DIAGNOSIS — D72829 Elevated white blood cell count, unspecified: Secondary | ICD-10-CM | POA: Diagnosis present

## 2023-04-19 DIAGNOSIS — Z79899 Other long term (current) drug therapy: Secondary | ICD-10-CM | POA: Diagnosis not present

## 2023-04-19 DIAGNOSIS — E785 Hyperlipidemia, unspecified: Secondary | ICD-10-CM | POA: Diagnosis present

## 2023-04-19 DIAGNOSIS — N1831 Chronic kidney disease, stage 3a: Secondary | ICD-10-CM | POA: Diagnosis present

## 2023-04-19 DIAGNOSIS — I2510 Atherosclerotic heart disease of native coronary artery without angina pectoris: Secondary | ICD-10-CM | POA: Diagnosis present

## 2023-04-19 DIAGNOSIS — D696 Thrombocytopenia, unspecified: Secondary | ICD-10-CM | POA: Diagnosis present

## 2023-04-19 DIAGNOSIS — E739 Lactose intolerance, unspecified: Secondary | ICD-10-CM | POA: Diagnosis present

## 2023-04-19 DIAGNOSIS — R7989 Other specified abnormal findings of blood chemistry: Secondary | ICD-10-CM | POA: Diagnosis not present

## 2023-04-19 DIAGNOSIS — J449 Chronic obstructive pulmonary disease, unspecified: Secondary | ICD-10-CM | POA: Diagnosis present

## 2023-04-19 DIAGNOSIS — R109 Unspecified abdominal pain: Secondary | ICD-10-CM | POA: Diagnosis present

## 2023-04-19 DIAGNOSIS — E875 Hyperkalemia: Secondary | ICD-10-CM | POA: Diagnosis present

## 2023-04-19 DIAGNOSIS — I480 Paroxysmal atrial fibrillation: Secondary | ICD-10-CM | POA: Diagnosis present

## 2023-04-19 DIAGNOSIS — Z7901 Long term (current) use of anticoagulants: Secondary | ICD-10-CM | POA: Diagnosis not present

## 2023-04-19 LAB — LIPID PANEL
Cholesterol: 293 mg/dL — ABNORMAL HIGH (ref 0–200)
HDL: 53 mg/dL (ref >40)
LDL Cholesterol: 224 mg/dL — ABNORMAL HIGH (ref 0–99)
Total CHOL/HDL Ratio: 5.5 RATIO
Triglycerides: 78 mg/dL (ref <150)
VLDL: 16 mg/dL (ref 0–40)

## 2023-04-19 LAB — WET PREP, GENITAL
Clue Cells Wet Prep HPF POC: NONE SEEN
Sperm: NONE SEEN
WBC, Wet Prep HPF POC: >=10 — AB (ref <10)
Yeast Wet Prep HPF POC: NONE SEEN

## 2023-04-19 LAB — URINALYSIS, W/ REFLEX TO CULTURE (INFECTION SUSPECTED)
Bacteria, UA: NONE SEEN
Bilirubin Urine: NEGATIVE
Glucose, UA: >=500 mg/dL — AB
Hgb urine dipstick: NEGATIVE
Ketones, ur: NEGATIVE mg/dL
Leukocytes,Ua: NEGATIVE
Nitrite: NEGATIVE
Protein, ur: 100 mg/dL — AB
Specific Gravity, Urine: 1.014 (ref 1.005–1.030)
pH: 5 (ref 5.0–8.0)

## 2023-04-19 LAB — URINALYSIS, ROUTINE W REFLEX MICROSCOPIC
Bilirubin Urine: NEGATIVE
Glucose, UA: >=500 mg/dL — AB
Ketones, ur: NEGATIVE mg/dL
Nitrite: NEGATIVE
Protein, ur: 100 mg/dL — AB
RBC / HPF: >50 RBC/hpf (ref 0–5)
Specific Gravity, Urine: 1.013 (ref 1.005–1.030)
WBC, UA: >50 WBC/hpf (ref 0–5)
pH: 5 (ref 5.0–8.0)

## 2023-04-19 LAB — BASIC METABOLIC PANEL
Anion gap: 11 (ref 5–15)
BUN: 28 mg/dL — ABNORMAL HIGH (ref 8–23)
CO2: 23 mmol/L (ref 22–32)
Calcium: 9.2 mg/dL (ref 8.9–10.3)
Chloride: 99 mmol/L (ref 98–111)
Creatinine, Ser: 1.63 mg/dL — ABNORMAL HIGH (ref 0.44–1.00)
GFR, Estimated: 35 mL/min — ABNORMAL LOW (ref >60)
Glucose, Bld: 167 mg/dL — ABNORMAL HIGH (ref 70–99)
Potassium: 4.3 mmol/L (ref 3.5–5.1)
Sodium: 133 mmol/L — ABNORMAL LOW (ref 135–145)

## 2023-04-19 LAB — HEMOGLOBIN A1C
Hgb A1c MFr Bld: 7.3 % — ABNORMAL HIGH (ref 4.8–5.6)
Mean Plasma Glucose: 162.81 mg/dL

## 2023-04-19 LAB — GC/CHLAMYDIA PROBE AMP (~~LOC~~) NOT AT ARMC
Chlamydia: NEGATIVE
Comment: NEGATIVE
Comment: NORMAL
Neisseria Gonorrhea: NEGATIVE

## 2023-04-19 LAB — LACTIC ACID, PLASMA
Lactic Acid, Venous: 1.2 mmol/L (ref 0.5–1.9)
Lactic Acid, Venous: 1.4 mmol/L (ref 0.5–1.9)

## 2023-04-19 LAB — TROPONIN I (HIGH SENSITIVITY)
Troponin I (High Sensitivity): 413 ng/L (ref <18)
Troponin I (High Sensitivity): 504 ng/L (ref <18)
Troponin I (High Sensitivity): 452 ng/L (ref <18)

## 2023-04-19 LAB — APTT: aPTT: 40 seconds — ABNORMAL HIGH (ref 24–36)

## 2023-04-19 LAB — TSH: TSH: 0.64 u[IU]/mL (ref 0.350–4.500)

## 2023-04-19 LAB — DIGOXIN LEVEL: Digoxin Level: 1.2 ng/mL (ref 0.8–2.0)

## 2023-04-19 LAB — HEPARIN LEVEL (UNFRACTIONATED): Heparin Unfractionated: 0.13 IU/mL — ABNORMAL LOW (ref 0.30–0.70)

## 2023-04-19 MED ORDER — HYDRALAZINE HCL 20 MG/ML IJ SOLN: 5.0000 mg | INTRAMUSCULAR | Status: DC | PRN

## 2023-04-19 MED ORDER — ALBUTEROL SULFATE (2.5 MG/3ML) 0.083% IN NEBU: 3.0000 mL | INHALATION_SOLUTION | Freq: Four times a day (QID) | RESPIRATORY_TRACT | Status: DC | PRN

## 2023-04-19 MED ORDER — ACETAMINOPHEN 650 MG RE SUPP: 650.0000 mg | Freq: Four times a day (QID) | RECTAL | Status: DC | PRN

## 2023-04-19 MED ORDER — ACETAMINOPHEN 325 MG PO TABS: 650.0000 mg | ORAL_TABLET | Freq: Four times a day (QID) | ORAL | Status: DC | PRN

## 2023-04-19 MED ORDER — ATORVASTATIN CALCIUM 40 MG PO TABS: 80.0000 mg | ORAL_TABLET | Freq: Every day | ORAL | Status: DC

## 2023-04-19 MED ORDER — SODIUM CHLORIDE 0.9% FLUSH
3.0000 mL | Freq: Two times a day (BID) | INTRAVENOUS | Status: DC
  Administered 2023-04-19 – 2023-04-22 (×6): 3 mL via INTRAVENOUS

## 2023-04-19 MED ORDER — FLUTICASONE FUROATE-VILANTEROL 200-25 MCG/ACT IN AEPB
1.0000 | INHALATION_SPRAY | Freq: Every day | RESPIRATORY_TRACT | Status: DC
  Administered 2023-04-20 – 2023-04-22 (×3): 1 via RESPIRATORY_TRACT
  Filled 2023-04-19: qty 28

## 2023-04-19 MED ORDER — NITROGLYCERIN 0.4 MG SL SUBL: 0.4000 mg | SUBLINGUAL_TABLET | SUBLINGUAL | Status: DC | PRN

## 2023-04-19 MED ORDER — ASPIRIN 325 MG PO TBEC: 325.0000 mg | DELAYED_RELEASE_TABLET | Freq: Every day | ORAL | Status: DC

## 2023-04-19 MED ORDER — HEPARIN (PORCINE) 25000 UT/250ML-% IV SOLN
650.0000 [IU]/h | INTRAVENOUS | Status: DC
  Administered 2023-04-19: 500 [IU]/h via INTRAVENOUS
  Filled 2023-04-19: qty 250

## 2023-04-19 MED ORDER — MAGNESIUM HYDROXIDE 400 MG/5ML PO SUSP: 30.0000 mL | Freq: Every day | ORAL | Status: DC | PRN

## 2023-04-19 MED ORDER — ONDANSETRON HCL 4 MG/2ML IJ SOLN
4.0000 mg | Freq: Once | INTRAMUSCULAR | Status: AC
  Administered 2023-04-19: 4 mg via INTRAVENOUS
  Filled 2023-04-19: qty 2

## 2023-04-19 MED ORDER — SPIRONOLACTONE 25 MG PO TABS
25.0000 mg | ORAL_TABLET | Freq: Every day | ORAL | Status: DC
  Filled 2023-04-19: qty 1

## 2023-04-19 MED ORDER — METRONIDAZOLE 500 MG/100ML IV SOLN
500.0000 mg | Freq: Two times a day (BID) | INTRAVENOUS | Status: DC
  Administered 2023-04-19 – 2023-04-22 (×7): 500 mg via INTRAVENOUS
  Filled 2023-04-19 (×7): qty 100

## 2023-04-19 MED ORDER — TRAZODONE HCL 50 MG PO TABS
25.0000 mg | ORAL_TABLET | Freq: Every evening | ORAL | Status: DC | PRN
  Administered 2023-04-19: 25 mg via ORAL
  Filled 2023-04-19: qty 1

## 2023-04-19 MED ORDER — UMECLIDINIUM BROMIDE 62.5 MCG/ACT IN AEPB
1.0000 | INHALATION_SPRAY | Freq: Every day | RESPIRATORY_TRACT | Status: DC
  Administered 2023-04-20 – 2023-04-22 (×3): 1 via RESPIRATORY_TRACT
  Filled 2023-04-19: qty 7

## 2023-04-19 MED ORDER — TORSEMIDE 20 MG PO TABS: 60.0000 mg | ORAL_TABLET | Freq: Every day | ORAL | Status: DC

## 2023-04-19 MED ORDER — ADULT MULTIVITAMIN W/MINERALS CH
1.0000 | ORAL_TABLET | Freq: Every day | ORAL | Status: DC
  Administered 2023-04-19: 1 via ORAL
  Filled 2023-04-19: qty 1

## 2023-04-19 MED ORDER — METOLAZONE 2.5 MG PO TABS: 2.5000 mg | ORAL_TABLET | Freq: Every day | ORAL | Status: DC | PRN

## 2023-04-19 MED ORDER — ONDANSETRON HCL 4 MG/2ML IJ SOLN
4.0000 mg | Freq: Four times a day (QID) | INTRAMUSCULAR | Status: DC | PRN
  Administered 2023-04-19 – 2023-04-20 (×3): 4 mg via INTRAVENOUS
  Filled 2023-04-19 (×3): qty 2

## 2023-04-19 MED ORDER — ATORVASTATIN CALCIUM 80 MG PO TABS
80.0000 mg | ORAL_TABLET | Freq: Every day | ORAL | Status: DC
  Administered 2023-04-19 – 2023-04-22 (×4): 80 mg via ORAL
  Filled 2023-04-19 (×4): qty 1

## 2023-04-19 MED ORDER — DIGOXIN 125 MCG PO TABS
0.1250 mg | ORAL_TABLET | Freq: Every day | ORAL | Status: DC
  Administered 2023-04-19 – 2023-04-22 (×4): 0.125 mg via ORAL
  Filled 2023-04-19 (×4): qty 1

## 2023-04-19 MED ORDER — IPRATROPIUM-ALBUTEROL 0.5-2.5 (3) MG/3ML IN SOLN: 3.0000 mL | RESPIRATORY_TRACT | Status: DC | PRN

## 2023-04-19 MED ORDER — OXYCODONE HCL 5 MG PO TABS
5.0000 mg | ORAL_TABLET | ORAL | Status: DC | PRN
  Administered 2023-04-19 – 2023-04-21 (×4): 5 mg via ORAL
  Filled 2023-04-19 (×4): qty 1

## 2023-04-19 MED ORDER — HYDROMORPHONE HCL 1 MG/ML IJ SOLN
0.5000 mg | Freq: Once | INTRAMUSCULAR | Status: AC
  Administered 2023-04-19: 0.5 mg via INTRAVENOUS
  Filled 2023-04-19: qty 1

## 2023-04-19 MED ORDER — ASPIRIN 325 MG PO TBEC
325.0000 mg | DELAYED_RELEASE_TABLET | Freq: Every day | ORAL | Status: DC
  Administered 2023-04-19 – 2023-04-20 (×2): 325 mg via ORAL
  Filled 2023-04-19 (×3): qty 1

## 2023-04-19 MED ORDER — IVABRADINE HCL 5 MG PO TABS
7.5000 mg | ORAL_TABLET | Freq: Two times a day (BID) | ORAL | Status: DC
  Administered 2023-04-19 – 2023-04-22 (×6): 7.5 mg via ORAL
  Filled 2023-04-19 (×8): qty 1

## 2023-04-19 MED ORDER — DAPAGLIFLOZIN PROPANEDIOL 10 MG PO TABS
10.0000 mg | ORAL_TABLET | Freq: Every day | ORAL | Status: DC
  Filled 2023-04-19: qty 1

## 2023-04-19 MED ORDER — ENSURE ENLIVE PO LIQD
2.0000 | Freq: Every day | ORAL | Status: DC
  Administered 2023-04-19 – 2023-04-20 (×2): 474 mL via ORAL
  Filled 2023-04-19 (×2): qty 474

## 2023-04-19 MED ORDER — SODIUM CHLORIDE 0.9 % IV SOLN: INTRAVENOUS | Status: DC

## 2023-04-19 MED ORDER — SODIUM CHLORIDE 0.9 % IV SOLN
1.0000 g | Freq: Once | INTRAVENOUS | Status: AC
  Administered 2023-04-19: 1 g via INTRAVENOUS
  Filled 2023-04-19: qty 10

## 2023-04-19 MED ORDER — MORPHINE SULFATE (PF) 2 MG/ML IV SOLN: 2.0000 mg | INTRAVENOUS | Status: DC | PRN

## 2023-04-19 MED ORDER — POTASSIUM CHLORIDE CRYS ER 20 MEQ PO TBCR
30.0000 meq | EXTENDED_RELEASE_TABLET | Freq: Two times a day (BID) | ORAL | Status: DC
  Administered 2023-04-19 – 2023-04-21 (×3): 30 meq via ORAL
  Filled 2023-04-19 (×5): qty 1

## 2023-04-19 MED ORDER — APIXABAN 2.5 MG PO TABS: 2.5000 mg | ORAL_TABLET | Freq: Two times a day (BID) | ORAL | Status: DC

## 2023-04-19 NOTE — Progress Notes (Signed)
ANTICOAGULATION CONSULT NOTE   Pharmacy Consult for Heparin (Apixaban on hold) Indication: chest pain/ACS and atrial fibrillation  Allergies  Allergen Reactions   Codeine Nausea And Vomiting    Vital Signs: Temp: 98.9 F (37.2 C) (05/09 1147) Temp Source: Oral (05/09 0731) BP: 121/72 (05/09 1147) Pulse Rate: 95 (05/09 1147)  Labs: Recent Labs    04/18/23 2240 04/19/23 0107 04/19/23 0631 04/19/23 0830 04/19/23 1455  HGB 15.6*  --   --   --   --   HCT 49.3*  --   --   --   --   PLT 259  --   --   --   --   APTT  --   --   --   --  40*  HEPARINUNFRC  --   --   --   --  0.13*  CREATININE 1.46*  --  1.63*  --   --   TROPONINIHS 325* 413* 504* 452*  --      Estimated Creatinine Clearance: 24.4 mL/min (A) (by C-G formula based on SCr of 1.63 mg/dL (H)).  Assessment: 62 y/o F with abdominal and flank pain. Found to have elevated troponin. On apxiaban PTA for afib. Starting heparin and holding apixaban. Last apixaban dose per med rec 5/7 0800, ok to start heparin now. Anticipate using aPTT to dose for now. CBC good.   Initial heparin level and PTT low  Goal of Therapy:  Heparin level 0.3-0.7 units/ml aPTT 66-102 seconds Monitor platelets by anticoagulation protocol: Yes   Plan:  Increase heparin to 650 units / hr Daily CBC, heparin level, and aPTT Monitor for bleeding  Thank you Okey Regal, PharmD

## 2023-04-19 NOTE — Progress Notes (Addendum)
Rounding Note    Patient Name: Brittany [name] Date of Encounter: 04/19/2023  [facility] Cardiologist: None   Subjective   Patient seen this morning. She was actively vomiting on my exam. Reports ongoing left flank/left lower back pain. Also reports some sensation of acid reflux (clarifies that this was going on prior to vomiting/ED visit yesterday). Denies chest discomfort or dyspnea. Denies noticing signs of volume overload at home or changes to exertional tolerance. Breathing has improved since she starting Trelegy after seeing outpatient pulm.  Inpatient Medications    Scheduled Meds:  aspirin EC  325 mg Oral Daily   atorvastatin  80 mg Oral Daily   digoxin  0.125 mg Oral Daily   feeding supplement  2 Bottle Oral Daily   fluticasone furoate-vilanterol  1 puff Inhalation Daily   And   umeclidinium bromide  1 puff Inhalation Daily   ivabradine  7.5 mg Oral BID WC   potassium chloride  30 mEq Oral BID   sodium chloride flush  3 mL Intravenous Q12H   Continuous Infusions:  heparin 500 Units/hr (04/19/23 0724)   metronidazole Stopped (04/19/23 0344)   PRN Meds: acetaminophen **OR** acetaminophen, hydrALAZINE, nitroGLYCERIN, ondansetron (ZOFRAN) IV, oxyCODONE, traZODone   Vital Signs    Vitals:   04/19/23 1000 04/19/23 1015 04/19/23 1030 04/19/23 1045  BP: (!) 112/57 (!) 113/57 (!) 114/58 (!) 113/55  Pulse: 94 93 91 93  Resp:      Temp:      TempSrc:      SpO2: (!) 89% 91% 91% 91%  Weight:      Height:        Intake/Output Summary (Last 24 hours) at 04/19/2023 1129 Last data filed at 04/19/2023 1027 Gross per 24 hour  Intake 585.44 ml  Output --  Net 585.44 ml      04/19/2023    7:37 AM 04/17/2023   12:30 PM 04/11/2023    2:20 PM  Last 3 Weights  Weight (lbs) 95 lb 14.4 oz 96 lb 96 lb 6.4 oz  Weight (kg) 43.5 kg 43.545 kg 43.727 kg      Telemetry    Sinus rhythm - Personally Reviewed  ECG    Sinus rhythm/sinus tachycardia - Personally  Reviewed  Physical Exam   GEN: Patient acutely ill, vomiting on exam.  Neck: No JVD Cardiac: regular rate, rapid, no murmurs, rubs, or gallops.  Respiratory: Clear to auscultation bilaterally. GI: limited exam due to active vomiting MS: No edema; No deformity. Neuro:  Nonfocal  Psych: Normal affect   Labs    High Sensitivity Troponin:   Recent Labs  Lab 04/18/23 2240 04/19/23 0107 04/19/23 0631 04/19/23 0830  TROPONINIHS 325* 413* 504* 452*     Chemistry Recent Labs  Lab 04/18/23 2240 04/19/23 0631  NA 135 133*  K 5.3* 4.3  CL 101 99  CO2 23 23  GLUCOSE 117* 167*  BUN 27* 28*  CREATININE 1.46* 1.63*  CALCIUM 9.2 9.2  MG 2.3  --   PROT 7.1  --   ALBUMIN 3.8  --   AST 73*  --   ALT 28  --   ALKPHOS 76  --   BILITOT 0.6  --   GFRNONAA 40* 35*  ANIONGAP 11 11    Lipids No results for input(s): "CHOL", "TRIG", "HDL", "LABVLDL", "LDLCALC", "CHOLHDL" in the last 168 hours.  Hematology Recent Labs  Lab 04/18/23 2240  WBC 12.5*  RBC 5.49*  HGB 15.6*  HCT 49.3*  MCV 89.8  MCH 28.4  MCHC 31.6  RDW 20.7*  PLT 259   Thyroid No results for input(s): "TSH", "FREET4" in the last 168 hours.  BNP Recent Labs  Lab 04/18/23 2240  BNP 1,203.6*    DDimer No results for input(s): "DDIMER" in the last 168 hours.   Radiology    CT RENAL STONE STUDY  Result Date: 04/18/2023 CLINICAL DATA:  Left-sided flank pain for several hours, initial encounter EXAM: CT ABDOMEN AND PELVIS WITHOUT CONTRAST TECHNIQUE: Multidetector CT imaging of the abdomen and pelvis was performed following the standard protocol without IV contrast. RADIATION DOSE REDUCTION: This exam was performed according to the departmental dose-optimization program which includes automated exposure control, adjustment of the mA and/or kV according to patient size and/or use of iterative reconstruction technique. COMPARISON:  12/10/2021 FINDINGS: Lower chest: Mild scarring is noted in the bases bilaterally stable  from the prior exam. Hepatobiliary: No focal liver abnormality is seen. No gallstones, gallbladder wall thickening, or biliary dilatation. Pancreas: Unremarkable. No pancreatic ductal dilatation or surrounding inflammatory changes. Spleen: Normal in size without focal abnormality. Adrenals/Urinary Tract: Adrenal glands are within normal limits. No renal calculi or obstructive changes are noted. No focal mass is seen. The bladder is decompressed. Stomach/Bowel: No obstructive or inflammatory changes of the colon are seen. The appendix is within normal limits. Small bowel and stomach are within normal limits. Vascular/Lymphatic: Aortic atherosclerosis. No enlarged abdominal or pelvic lymph nodes. Reproductive: Status post hysterectomy. No adnexal masses. Other: No abdominal wall hernia or abnormality. No abdominopelvic ascites. Musculoskeletal: No acute or significant osseous findings. IMPRESSION: No acute abnormality is noted. Electronically Signed   By: Alcide Clever M.D.   On: 04/18/2023 23:59   DG Chest Portable 1 View  Result Date: 04/18/2023 CLINICAL DATA:  Shortness of breath. EXAM: PORTABLE CHEST 1 VIEW COMPARISON:  Chest radiograph dated 03/03/2023. FINDINGS: No focal consolidation, pleural effusion, pneumothorax. The lungs are hyperinflated with flattening of the diaphragms. Mild cardiomegaly. Median sternotomy wires, CABG vascular clips, and left pectoral AICD device. Stimulator device over the right chest. No acute osseous pathology. IMPRESSION: 1. No active disease. 2. Mild cardiomegaly. Electronically Signed   By: Elgie Collard M.D.   On: 04/18/2023 22:54    Cardiac Studies   07/10/22 TTE  IMPRESSIONS     1. Left ventricular ejection fraction, by estimation, is 20 to 25%. The  left ventricle has severely decreased function. There is global  hypokinesis with akinesis of the mid-to-apical septal, mid-to-apical  inferior, all apical segments and apex. The left   ventricular internal cavity  size was moderately dilated. Left ventricular  diastolic parameters are consistent with Grade II diastolic dysfunction  (pseudonormalization). Elevated left atrial pressure.   2. Right ventricular systolic function is mildly reduced. The right  ventricular size is normal. Tricuspid regurgitation signal is inadequate  for assessing PA pressure.   3. Left atrial size was severely dilated.   4. The mitral valve is abnormal. Mild mitral valve regurgitation.   5. The aortic valve is tricuspid. There is mild calcification of the  aortic valve. There is mild thickening of the aortic valve. Aortic valve  regurgitation is mild to moderate.   6. The inferior vena cava is normal in size with greater than 50%  respiratory variability, suggesting right atrial pressure of 3 mmHg.   Comparison(s): Compared to prior TTE on 12/11/2021, there is no signficant  change.   FINDINGS   Left Ventricle: There is global hypokinesis with akinesis of  the  mid-to-apical inferoseptal, mid-to-apical anteroseptal, mid-to-apical  inferior, and all apical LV segments. Left ventricular ejection fraction,  by estimation, is 20 to 25%. The left  ventricle has severely decreased function. The left ventricle demonstrates  regional wall motion abnormalities. The left ventricular internal cavity  size was moderately dilated. There is no left ventricular hypertrophy.  Left ventricular diastolic  parameters are consistent with Grade II diastolic dysfunction  (pseudonormalization). Elevated left atrial pressure.   Right Ventricle: The right ventricular size is normal. No increase in  right ventricular wall thickness. Right ventricular systolic function is  mildly reduced. Tricuspid regurgitation signal is inadequate for assessing  PA pressure.   Left Atrium: Left atrial size was severely dilated.   Right Atrium: Right atrial size was normal in size.   Pericardium: There is no evidence of pericardial effusion.   Mitral  Valve: The mitral valve is abnormal. There is mild thickening of  the mitral valve leaflet(s). There is mild calcification of the mitral  valve leaflet(s). Mild to moderate mitral annular calcification. Mild  mitral valve regurgitation.   Tricuspid Valve: The tricuspid valve is normal in structure. Tricuspid  valve regurgitation is trivial.   Aortic Valve: The aortic valve is tricuspid. There is mild calcification  of the aortic valve. There is mild thickening of the aortic valve. Aortic  valve regurgitation is mild to moderate.   Pulmonic Valve: The pulmonic valve was normal in structure. Pulmonic valve  regurgitation is trivial.   Aorta: The aortic root and ascending aorta are structurally normal, with  no evidence of dilitation.   Venous: The inferior vena cava is normal in size with greater than 50%  respiratory variability, suggesting right atrial pressure of 3 mmHg.   IAS/Shunts: There is right bowing of the interatrial septum, suggestive of  elevated left atrial pressure. The atrial septum is grossly normal.   Additional Comments: A device lead is visualized.   Patient Profile     Eliah Zhao is a 62 y.o. female with a hx of COPD, chronic hypoxic respiratory failure, chronic systolic and diastolic heart failure, A-fib, coronary disease status post CABG who is being seen for the evaluation of elevated troponin at the request of emergency dept provider.  Assessment & Plan    NSTEMI Hx CAD s/p CABG  Patient presented to the ED with 1 day of left side/abdominal pain and severe flank pain with nausea/vomiting. Also reported some non-specific chest squeeze sensation. Initial UA appeared to show UTI but repeat was notably different (first perhaps not clean catch?). Wet prep with trichomonas. Troponin found elevated at 325->413->504->452. BNP 1203.6.  ECGs so far largely non-diagnostic due to significant artifact. Will request new tracing Suspect that this is type II  MI rather than ACS given atypical symptoms and significant emesis. Echocardiogram pending. If echocardiogram shows acute WMA or patient develops more consistent chest discomfort despite management of nausea/vomiting, would need to consider ischemic evaluation. Continue high intensity statin No ASA/Brilinta 2/2 bleeding hx Avoid BB due to history of low output/COPD Patient currently on heparin w/ afib and renal infarct hx. Reasonable to continue this until echocardiogram results and given limited oral intake 2/2 vomiting.  Chronic combined systolic and diastolic CHF  Echo 07/10/22: EF 20-25%, global hypokinesis, grade II DD, RV systolic function mildly reduced. RV size normal. Mild MR, trivial TR, aortic valve w/ mild-mod regurg.  Although patient with BNP 1203.6, this is overall stable with recent values. Could consider checking Reds Clip if volume status unclear.  Appears euvolemic on exam Echocardiogram today Could consider checking Reds Clip if volume status unclear Plan to continue Digoxin 0.125mg  QD Hold Farxiga given concern for UTI Continue Spironolactone 25mg  Continue Corlanor 7.5mg  BID  Paroxysmal atrial fibrillation CHA2DS2/VASc 4   Sinus rhythm per telemetry. Continue anticoagulation. Heparin ordered by admitting medicine team.   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 Continue anticoagulation.   Abdominal/left flank pain  UA/wet prep with Trichomonas. Question whether this could be some type of gastroenteritis. Given pain disproportionate to exam, will check lactic acid.   Per primary team: COPD      For questions or updates, please contact [facility] Please consult [email or web address] for contact info under        Signed, Perlie Gold, PA-C  04/19/2023, 11:29 AM

## 2023-04-19 NOTE — ED Provider Notes (Signed)
I assumed care of this patient.  Please see previous provider's note for further details of history, exam, and MDM.   Briefly patient is a 62 y.o. female who presented with left flank pain, nausea and vomiting.  Dr. Durwin Nora noted that patient was having mild chest discomfort and shortness of breath.  She states that it feels like heartburn.  Workup is notable for baseline BMP but elevated troponin of 300s up from 20s. Also noted to have hyperkalemia. Digoxin level was ordered. Cardiology was consulted.  They evaluated the patient and recommended ECHO during admission. They will follow.  UA questionable for infection but contaminated.  Also positive for trichomonas.  Upon further questioning, patient stated that she has not had intercourse in over 10 years. She was given rocephin and flagyl for possible pyelo and/or PID  CT stone study negative for stone or obvious inflammatory changes concerning for pyelonephritis, diverticulitis or colitis.  No bowel obstruction noted.  In and out catheter and repeat urine obtained which was negative.  Wet prep swab was obtained and positive for trichomonas again.  Consulted hospitalist for admission.   .Critical Care  Performed by: Nira Conn, MD Authorized by: Nira Conn, MD   Critical care provider statement:    Critical care time (minutes):  30   Critical care was necessary to treat or prevent imminent or life-threatening deterioration of the following conditions:  Cardiac failure   Critical care was time spent personally by me on the following activities:  Development of treatment plan with patient or surrogate, discussions with consultants, evaluation of patient's response to treatment, examination of patient, ordering and review of laboratory studies, ordering and review of radiographic studies, ordering and performing treatments and interventions, pulse oximetry, re-evaluation of patient's condition and review of old charts    Care discussed with: admitting provider        Nira Conn, MD 04/19/23 [phone]

## 2023-04-19 NOTE — ED Notes (Signed)
ED TO INPATIENT HANDOFF REPORT  ED Nurse Name and Phone #: [address] [phone]  S Name/Age/Gender Brittany [name] 62 y.o. female Room/Bed: 043C/043C  Code Status   Code Status: Full Code  Home/SNF/Other Home Patient oriented to: self, place, time, and situation Is this baseline? Yes   Triage Complete: Triage complete  Chief Complaint NSTEMI (non-ST elevated myocardial infarction) [facility]) [I21.4]  Triage Note Patient bib [facility] with complaints of left sided flank pain, nausea and vomiting since noon today. Patient denies chest pain but states she has had heartburn. Patient has a history of MI, heart failure, COPD. Patient has a defibrillator she states it is [employer].    Allergies Allergies  Allergen Reactions   Codeine Nausea And Vomiting    Level of Care/Admitting Diagnosis ED Disposition     ED Disposition  Admit   Condition  --   Comment  Hospital Area: [facility] [[id number]]  Level of Care: Telemetry Cardiac [[id number]]  May admit patient to [facility] or [facility] if equivalent level of care is available:: No  Covid Evaluation: Asymptomatic - no recent exposure (last 10 days) testing not required  Diagnosis: NSTEMI (non-ST elevated myocardial infarction) [name]) [[id number]]  Admitting Physician: Hannah Beat [[id number]]  Attending Physician: Hannah Beat [[id number]]  Certification:: I certify this patient will need inpatient services for at least 2 midnights  Estimated Length of Stay: 2          B Medical/Surgery History Past Medical History:  Diagnosis Date   Acute respiratory failure (HCC) 05/05/2018   Acute systolic congestive heart failure (HCC) 02/03/2018   AICD (automatic cardioverter/defibrillator) present    Allergy    Anxiety    Asthma    DM2 (diabetes mellitus, type 2) (HCC) 10/20/2020   Hypertension    PAF (paroxysmal atrial fibrillation) (HCC)    Presence of permanent cardiac pacemaker    Prophylactic measure 08/03/14-08/19/14    Prophyl. 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   LIMA to LAD, SVG to D1, SVG to OM1, EVH via right thigh with implantation of Impella LD LVAD via direct aortic approach   Small cell lung cancer (HCC) 03/16/2014   Past Surgical History:  Procedure Laterality Date   BRONCHIAL BRUSHINGS  10/25/2020   Procedure: BRONCHIAL BRUSHINGS;  Surgeon: Leslye Peer, MD;  Location: [facility] ENDOSCOPY;  Service: Pulmonary;;   BRONCHIAL NEEDLE ASPIRATION BIOPSY  10/25/2020   Procedure: BRONCHIAL NEEDLE ASPIRATION BIOPSIES;  Surgeon: Leslye Peer, MD;  Location: [facility] ENDOSCOPY;  Service: Pulmonary;;   CARDIAC DEFIBRILLATOR PLACEMENT  08/15/2018   MDT Visia AF MRI VR ICD implanted by Dr Georgena Spurling for primary prevention of sudden   CESAREAN SECTION     CORONARY ARTERY BYPASS GRAFT N/A 02/03/2018   Procedure: CORONARY ARTERY BYPASS GRAFTING (CABG);  Surgeon: Purcell Nails, MD;  Location: [facility] OR;  Service: Open Heart Surgery;  Laterality: N/A;  Time 3 using left internal mammary artery and endoscopically harvested right saphenous vein   CORONARY BALLOON ANGIOPLASTY N/A 02/03/2018   Procedure: CORONARY BALLOON ANGIOPLASTY;  Surgeon: Swaziland, Peter M, MD;  Location: [facility] INVASIVE CV LAB;  Service: Cardiovascular;  Laterality: N/A;   CORONARY STENT INTERVENTION N/A 02/12/2019   Procedure: CORONARY STENT INTERVENTION;  Surgeon: Iran Ouch, MD;  Location: [facility] INVASIVE CV LAB;  Service: Cardiovascular;  Laterality: N/A;   CORONARY/GRAFT ACUTE MI REVASCULARIZATION N/A 02/03/2018   Procedure: Coronary/Graft Acute MI Revascularization;  Surgeon: Swaziland, Peter M, MD;  Location: [facility] INVASIVE CV LAB;  Service: Cardiovascular;  Laterality: N/A;   ENDOBRONCHIAL ULTRASOUND N/A 10/25/2020   Procedure: ENDOBRONCHIAL ULTRASOUND;  Surgeon: Leslye Peer, MD;  Location[facility]COPY;  Service: Pulmonary;  Laterality: N/A;   FLEXIBLE BRONCHOSCOPY  10/25/2020   Procedure: FLEXIBLE BRONCHOSCOPY;  Surgeon: Leslye Peer, MD;  Location: [facility] ENDOSCOPY;  Service: Pulmonary;;   IABP INSERTION N/A 02/03/2018   Procedure: IABP Insertion;  Surgeon: Swaziland, Peter M, MD;  Location: [facility] INVASIVE CV LAB;  Service: Cardiovascular;  Laterality: N/A;   INTRAOPERATIVE TRANSESOPHAGEAL ECHOCARDIOGRAM N/A 02/03/2018   Procedure: INTRAOPERATIVE TRANSESOPHAGEAL ECHOCARDIOGRAM;  Surgeon: Purcell Nails, MD;  Location: [facility] OR;  Service: Open Heart Surgery;  Laterality: N/A;   LEFT HEART CATH AND CORONARY ANGIOGRAPHY N/A 02/03/2018   Procedure: LEFT HEART CATH AND CORONARY ANGIOGRAPHY;  Surgeon: Swaziland, Peter M, MD;  Location: [facility] INVASIVE CV LAB;  Service: Cardiovascular;  Laterality: N/A;   LEFT HEART CATH AND CORS/GRAFTS ANGIOGRAPHY N/A 02/12/2019   Procedure: LEFT HEART CATH AND CORS/GRAFTS ANGIOGRAPHY;  Surgeon: Iran Ouch, MD;  Location: [facility] INVASIVE CV LAB;  Service: Cardiovascular;  Laterality: N/A;   MEDIASTINOSCOPY N/A 03/11/2014   Procedure: MEDIASTINOSCOPY;  Surgeon: Loreli Slot, MD;  Location: [facility] OR;  Service: Thoracic;  Laterality: N/A;   PLACEMENT OF IMPELLA LEFT VENTRICULAR ASSIST DEVICE  02/03/2018   Procedure: PLACEMENT OF IMPELLA LEFT VENTRICULAR ASSIST DEVICE LD;  Surgeon: Purcell Nails, MD;  Location: [facility] OR;  Service: Open Heart Surgery;;   REMOVAL OF IMPELLA LEFT VENTRICULAR ASSIST DEVICE N/A 02/08/2018   Procedure: REMOVAL OF IMPELLA LEFT VENTRICULAR ASSIST DEVICE;  Surgeon: Purcell Nails, MD;  Location: [facility] OR;  Service: Open Heart Surgery;  Laterality: N/A;   RIGHT HEART CATH N/A 02/03/2018   Procedure: RIGHT HEART CATH;  Surgeon: Swaziland, Peter M, MD;  Location: [facility] INVASIVE CV LAB;  Service: Cardiovascular;  Laterality: N/A;   RIGHT HEART CATH N/A 05/09/2018   Procedure: RIGHT HEART CATH;  Surgeon: Dolores Patty, MD;  Location: [facility] INVASIVE CV LAB;  Service: Cardiovascular;  Laterality: N/A;   TEE WITHOUT CARDIOVERSION N/A 02/08/2018   Procedure: TRANSESOPHAGEAL ECHOCARDIOGRAM (TEE);   Surgeon: Purcell Nails, MD;  Location: [facility] OR;  Service: Open Heart Surgery;  Laterality: N/A;   TUBAL LIGATION     VIDEO BRONCHOSCOPY WITH ENDOBRONCHIAL ULTRASOUND N/A 03/11/2014   Procedure: VIDEO BRONCHOSCOPY WITH ENDOBRONCHIAL ULTRASOUND;  Surgeon: Loreli Slot, MD;  Location: [facility] OR;  Service: Thoracic;  Laterality: N/A;     A IV Location/Drains/Wounds Patient Lines/Drains/Airways Status     Active Line/Drains/Airways     Name Placement date Placement time Site Days   Peripheral IV 04/18/23 20 G Left Antecubital 04/18/23  2158  Antecubital  1   Peripheral IV 04/19/23 20 G Posterior;Right Forearm 04/19/23  0721  Forearm  less than 1            Intake/Output Last 24 hours  Intake/Output Summary (Last 24 hours) at 04/19/2023 1049 Last data filed at 04/19/2023 1027 Gross per 24 hour  Intake 585.44 ml  Output --  Net 585.44 ml    Labs/Imaging Results for orders placed or performed during the hospital encounter of 04/18/23 (from the past 48 hour(s))  Comprehensive metabolic panel     Status: Abnormal   Collection Time: 04/18/23 10:40 PM  Result Value Ref Range   Sodium 135 135 - 145 mmol/L   Potassium 5.3 (H) 3.5 - 5.1 mmol/L   Chloride  101 98 - 111 mmol/L   CO2 23 22 - 32 mmol/L   Glucose, Bld 117 (H) 70 - 99 mg/dL    Comment: Glucose reference range applies only to samples taken after fasting for at least 8 hours.   BUN 27 (H) 8 - 23 mg/dL   Creatinine, Ser 1.61 (H) 0.44 - 1.00 mg/dL   Calcium 9.2 8.9 - 09.6 mg/dL   Total Protein 7.1 6.5 - 8.1 g/dL   Albumin 3.8 3.5 - 5.0 g/dL   AST 73 (H) 15 - 41 U/L   ALT 28 0 - 44 U/L   Alkaline Phosphatase 76 38 - 126 U/L   Total Bilirubin 0.6 0.3 - 1.2 mg/dL   GFR, Estimated 40 (L) >60 mL/min    Comment: (NOTE) Calculated using the CKD-EPI Creatinine Equation (2021)    Anion gap 11 5 - 15    Comment: Performed at [facility] Lab, [address], [place], [place] [address]  Lipase, blood     Status: None    Collection Time: 04/18/23 10:40 PM  Result Value Ref Range   Lipase 30 11 - 51 U/L    Comment: HEMOLYSIS AT THIS LEVEL MAY AFFECT RESULT Performed at [facility], [address] [place], [place] [address]   CBC with Diff     Status: Abnormal   Collection Time: 04/18/23 10:40 PM  Result Value Ref Range   WBC 12.5 (H) 4.0 - 10.5 K/uL   RBC 5.49 (H) 3.87 - 5.11 MIL/uL   Hemoglobin 15.6 (H) 12.0 - 15.0 g/dL   HCT 14.7 (H) 82.9 - 56.2 %   MCV 89.8 80.0 - 100.0 fL   MCH 28.4 26.0 - 34.0 pg   MCHC 31.6 30.0 - 36.0 g/dL   RDW 13.0 (H) 86.5 - 78.4 %   Platelets 259 150 - 400 K/uL   nRBC 0.0 0.0 - 0.2 %   Neutrophils Relative % 83 %   Neutro Abs 10.3 (H) 1.7 - 7.7 K/uL   Lymphocytes Relative 9 %   Lymphs Abs 1.1 0.7 - 4.0 K/uL   Monocytes Relative 7 %   Monocytes Absolute 0.9 0.1 - 1.0 K/uL   Eosinophils Relative 0 %   Eosinophils Absolute 0.0 0.0 - 0.5 K/uL   Basophils Relative 1 %   Basophils Absolute 0.1 0.0 - 0.1 K/uL   Immature Granulocytes 0 %   Abs Immature Granulocytes 0.05 0.00 - 0.07 K/uL    Comment: Performed at [facility], [address] [place], [place] [address]  Magnesium     Status: None   Collection Time: 04/18/23 10:40 PM  Result Value Ref Range   Magnesium 2.3 1.7 - 2.4 mg/dL    Comment: HEMOLYSIS AT THIS LEVEL MAY AFFECT RESULT Performed at [facility], [address], [place], [place] [address]   Brain natriuretic peptide     Status: Abnormal   Collection Time: 04/18/23 10:40 PM  Result Value Ref Range   B Natriuretic Peptide 1,203.6 (H) 0.0 - 100.0 pg/mL    Comment: Performed at [facility], [address], [place], [place] [address]  Troponin I (High Sensitivity)     Status: Abnormal   Collection Time: 04/18/23 10:40 PM  Result Value Ref Range   Troponin I (High Sensitivity) 325 (HH) <18 ng/L    Comment: CRITICAL RESULT CALLED TO, READ BACK BY AND VERIFIED WITH K. SWORD RN 04/18/23 @2354  BY J. WHITE (NOTE) Elevated high  sensitivity troponin I (hsTnI) values and significant  changes across serial measurements may suggest ACS but many other  chronic and acute conditions are known to elevate hsTnI results.  Refer to the "Links" section for chest pain algorithms and additional  guidance. Performed at [facility], [address] [place], [place] [address]   Digoxin level     Status: None   Collection Time: 04/18/23 11:53 PM  Result Value Ref Range   Digoxin Level 1.2 0.8 - 2.0 ng/mL    Comment: Performed at [facility] Lab, [address], [place], [place] [address]  Troponin I (High Sensitivity)     Status: Abnormal   Collection Time: 04/19/23  1:07 AM  Result Value Ref Range   Troponin I (High Sensitivity) 413 (HH) <18 ng/L    Comment: CRITICAL VALUE NOTED. VALUE IS CONSISTENT WITH PREVIOUSLY REPORTED/CALLED VALUE (NOTE) Elevated high sensitivity troponin I (hsTnI) values and significant  changes across serial measurements may suggest ACS but many other  chronic and acute conditions are known to elevate hsTnI results.  Refer to the "Links" section for chest pain algorithms and additional  guidance. Performed at [facility], [address] [place], [place] [address]   Urinalysis, Routine w reflex microscopic -Urine, Clean Catch     Status: Abnormal   Collection Time: 04/19/23  1:39 AM  Result Value Ref Range   Color, Urine YELLOW YELLOW   APPearance CLOUDY (A) CLEAR   Specific Gravity, Urine 1.013 1.005 - 1.030   pH 5.0 5.0 - 8.0   Glucose, UA >=500 (A) NEGATIVE mg/dL   Hgb urine dipstick MODERATE (A) NEGATIVE   Bilirubin Urine NEGATIVE NEGATIVE   Ketones, ur NEGATIVE NEGATIVE mg/dL   Protein, ur 469 (A) NEGATIVE mg/dL   Nitrite NEGATIVE NEGATIVE   Leukocytes,Ua LARGE (A) NEGATIVE   RBC / HPF >50 0 - 5 RBC/hpf   WBC, UA >50 0 - 5 WBC/hpf   Bacteria, UA MANY (A) NONE SEEN   Squamous Epithelial / HPF 21-50 0 - 5 /HPF   Mucus PRESENT    Trichomonas, UA PRESENT (A) NONE SEEN    Non Squamous Epithelial 0-5 (A) NONE SEEN    Comment: Performed at [facility] Lab, [address], [place], [place] [address]  Wet prep, genital     Status: Abnormal   Collection Time: 04/19/23  3:00 AM  Result Value Ref Range   Yeast Wet Prep HPF POC NONE SEEN NONE SEEN   Trich, Wet Prep PRESENT (A) NONE SEEN   Clue Cells Wet Prep HPF POC NONE SEEN NONE SEEN   WBC, Wet Prep HPF POC >=10 (A) <10   Sperm NONE SEEN     Comment: Performed at [facility] Lab, [address], [place], [place] [address]  Urinalysis, w/ Reflex to Culture (Infection Suspected) -Urine, Clean Catch     Status: Abnormal   Collection Time: 04/19/23  3:00 AM  Result Value Ref Range   Specimen Source URINE, CLEAN CATCH    Color, Urine YELLOW YELLOW   APPearance CLEAR CLEAR   Specific Gravity, Urine 1.014 1.005 - 1.030   pH 5.0 5.0 - 8.0   Glucose, UA >=500 (A) NEGATIVE mg/dL   Hgb urine dipstick NEGATIVE NEGATIVE   Bilirubin Urine NEGATIVE NEGATIVE   Ketones, ur NEGATIVE NEGATIVE mg/dL   Protein, ur 440 (A) NEGATIVE mg/dL   Nitrite NEGATIVE NEGATIVE   Leukocytes,Ua NEGATIVE NEGATIVE   RBC / HPF 0-5 0 -  5 RBC/hpf   WBC, UA 0-5 0 - 5 WBC/hpf    Comment:        Reflex urine culture not performed if WBC <=10, OR if Squamous epithelial cells >5. If Squamous epithelial cells >5 suggest recollection.    Bacteria, UA NONE SEEN NONE SEEN   Squamous Epithelial / HPF 0-5 0 - 5 /HPF   Mucus PRESENT    Hyaline Casts, UA PRESENT     Comment: Performed at [facility], [address] [place], [place] [address]  Troponin I (High Sensitivity)     Status: Abnormal   Collection Time: 04/19/23  6:31 AM  Result Value Ref Range   Troponin I (High Sensitivity) 504 (HH) <18 ng/L    Comment: CRITICAL VALUE NOTED. VALUE IS CONSISTENT WITH PREVIOUSLY REPORTED/CALLED VALUE (NOTE) Elevated high sensitivity troponin I (hsTnI) values and significant  changes across serial measurements may suggest ACS but many other   chronic and acute conditions are known to elevate hsTnI results.  Refer to the "Links" section for chest pain algorithms and additional  guidance. Performed at [facility], [address] [place], [place] [address]   Basic metabolic panel     Status: Abnormal   Collection Time: 04/19/23  6:31 AM  Result Value Ref Range   Sodium 133 (L) 135 - 145 mmol/L   Potassium 4.3 3.5 - 5.1 mmol/L   Chloride 99 98 - 111 mmol/L   CO2 23 22 - 32 mmol/L   Glucose, Bld 167 (H) 70 - 99 mg/dL    Comment: Glucose reference range applies only to samples taken after fasting for at least 8 hours.   BUN 28 (H) 8 - 23 mg/dL   Creatinine, Ser 0.98 (H) 0.44 - 1.00 mg/dL   Calcium 9.2 8.9 - 11.9 mg/dL   GFR, Estimated 35 (L) >60 mL/min    Comment: (NOTE) Calculated using the CKD-EPI Creatinine Equation (2021)    Anion gap 11 5 - 15    Comment: Performed at [facility], [address] [place] [address], [place], [place] [address]  Troponin I (High Sensitivity)     Status: Abnormal   Collection Time: 04/19/23  8:30 AM  Result Value Ref Range   Troponin I (High Sensitivity) 452 (HH) <18 ng/L    Comment: CRITICAL VALUE NOTED. VALUE IS CONSISTENT WITH PREVIOUSLY REPORTED/CALLED VALUE (NOTE) Elevated high sensitivity troponin I (hsTnI) values and significant  changes across serial measurements may suggest ACS but many other  chronic and acute conditions are known to elevate hsTnI results.  Refer to the "Links" section for chest pain algorithms and additional  guidance. Performed at [facility], [address] [place], [place] [address]    CT RENAL STONE STUDY  Result Date: 04/18/2023 CLINICAL DATA:  Left-sided flank pain for several hours, initial encounter EXAM: CT ABDOMEN AND PELVIS WITHOUT CONTRAST TECHNIQUE: Multidetector CT imaging of the abdomen and pelvis was performed following the standard protocol without IV contrast. RADIATION DOSE REDUCTION: This exam was performed according to the  departmental dose-optimization program which includes automated exposure control, adjustment of the mA and/or kV according to patient size and/or use of iterative reconstruction technique. COMPARISON:  12/10/2021 FINDINGS: Lower chest: Mild scarring is noted in the bases bilaterally stable from the prior exam. Hepatobiliary: No focal liver abnormality is seen. No gallstones, gallbladder wall thickening, or biliary dilatation. Pancreas: Unremarkable. No pancreatic ductal dilatation or surrounding inflammatory changes. Spleen: Normal in size without focal abnormality. Adrenals/Urinary  Tract: Adrenal glands are within normal limits. No renal calculi or obstructive changes are noted. No focal mass is seen. The bladder is decompressed. Stomach/Bowel: No obstructive or inflammatory changes of the colon are seen. The appendix is within normal limits. Small bowel and stomach are within normal limits. Vascular/Lymphatic: Aortic atherosclerosis. No enlarged abdominal or pelvic lymph nodes. Reproductive: Status post hysterectomy. No adnexal masses. Other: No abdominal wall hernia or abnormality. No abdominopelvic ascites. Musculoskeletal: No acute or significant osseous findings. IMPRESSION: No acute abnormality is noted. Electronically Signed   By: Alcide Clever M.D.   On: 04/18/2023 23:59   DG Chest Portable 1 View  Result Date: 04/18/2023 CLINICAL DATA:  Shortness of breath. EXAM: PORTABLE CHEST 1 VIEW COMPARISON:  Chest radiograph dated 03/03/2023. FINDINGS: No focal consolidation, pleural effusion, pneumothorax. The lungs are hyperinflated with flattening of the diaphragms. Mild cardiomegaly. Median sternotomy wires, CABG vascular clips, and left pectoral AICD device. Stimulator device over the right chest. No acute osseous pathology. IMPRESSION: 1. No active disease. 2. Mild cardiomegaly. Electronically Signed   By: Elgie Collard M.D.   On: 04/18/2023 22:54    Pending Labs Unresulted Labs (From admission, onward)      Start     Ordered   04/20/23 0500  Basic metabolic panel  Tomorrow morning,   R        04/19/23 0610   04/20/23 0500  CBC  Tomorrow morning,   R        04/19/23 0610   04/19/23 1500  Heparin level (unfractionated)  Once-Timed,   TIMED        04/19/23 0644   04/19/23 1500  APTT  Once-Timed,   TIMED        04/19/23 0644            Vitals/Pain Today's Vitals   04/19/23 1000 04/19/23 1015 04/19/23 1030 04/19/23 1045  BP: (!) 112/57 (!) 113/57 (!) 114/58 (!) 113/55  Pulse: 94 93 91 93  Resp:      Temp:      TempSrc:      SpO2: (!) 89% 91% 91% 91%  Weight:      Height:      PainSc:        Isolation Precautions No active isolations  Medications Medications  metroNIDAZOLE (FLAGYL) IVPB 500 mg (0 mg Intravenous Stopped 04/19/23 0344)  digoxin (LANOXIN) tablet 0.125 mg (0.125 mg Oral Given 04/19/23 0949)  ivabradine (CORLANOR) tablet 7.5 mg (has no administration in time range)  feeding supplement (ENSURE ENLIVE / ENSURE PLUS) liquid 474 mL (has no administration in time range)  potassium chloride (KLOR-CON M) CR tablet 30 mEq (has no administration in time range)  acetaminophen (TYLENOL) tablet 650 mg (has no administration in time range)    Or  acetaminophen (TYLENOL) suppository 650 mg (has no administration in time range)  traZODone (DESYREL) tablet 25 mg (has no administration in time range)  atorvastatin (LIPITOR) tablet 80 mg (80 mg Oral Given 04/19/23 0634)  heparin ADULT infusion 100 units/mL (25000 units/241mL) (500 Units/hr Intravenous New Bag/Given 04/19/23 0724)  nitroGLYCERIN (NITROSTAT) SL tablet 0.4 mg (has no administration in time range)  aspirin EC tablet 325 mg (325 mg Oral Given 04/19/23 0949)  ondansetron (ZOFRAN) injection 4 mg (4 mg Intravenous Given 04/19/23 0945)  fluticasone furoate-vilanterol (BREO ELLIPTA) 200-25 MCG/ACT 1 puff (has no administration in time range)    And  umeclidinium bromide (INCRUSE ELLIPTA) 62.5 MCG/ACT 1 puff (has no administration  in time range)  sodium chloride flush (NS) 0.9 % injection 3 mL (has no administration in time range)  oxyCODONE (Oxy IR/ROXICODONE) immediate release tablet 5 mg (has no administration in time range)  hydrALAZINE (APRESOLINE) injection 5 mg (has no administration in time range)  HYDROmorphone (DILAUDID) injection 0.5 mg (0.5 mg Intravenous Given 04/18/23 2241)  HYDROmorphone (DILAUDID) injection 0.5 mg (0.5 mg Intravenous Given 04/19/23 0108)  ondansetron (ZOFRAN) injection 4 mg (4 mg Intravenous Given 04/19/23 0139)  cefTRIAXone (ROCEPHIN) 1 g in sodium chloride 0.9 % 100 mL IVPB (0 g Intravenous Stopped 04/19/23 0325)  HYDROmorphone (DILAUDID) injection 0.5 mg (0.5 mg Intravenous Given 04/19/23 0511)    Mobility walks     Focused Assessments Cardiac Assessment Handoff:  Cardiac Rhythm:  (HX of a fib-admit for NSTEMI) Lab Results  Component Value Date   TROPONINI 10.61 (HH) 02/12/2019   Lab Results  Component Value Date   DDIMER 1.28 (H) 04/09/2022   Does the Patient currently have chest pain? No    R Recommendations: See Admitting Provider Note  Report given to:   Additional Notes: Trying to get pain meds straight. Waiting for oxy to be cleared by pharmacist

## 2023-04-19 NOTE — H&P (Signed)
History and Physical    Patient: Brittany [name] ZOX:[id number] DOB: 10-May-1961 DOA: 04/18/2023 DOS: the patient was seen and examined on 04/19/2023 PCP: Pcp, No  Patient coming from: Home - lives with alone; NOK: [place] Score, [phone]   Chief Complaint: Flank pain  HPI: Brittany [name] is a 62 y.o. female with medical history significant of chronic systolic CHF with AICD,  DM, HTN, afib, pacemaker placement, and CAD s/p CABG presenting with flank pain.  She reports that she kept vomiting "like I am now."  Symptoms started 2 days ago.  L flank pain.  Nothing makes it better or worse.  No diarrhea.  No sick contacts.  She doesn't think she ate anything bad.    ER Course:  L flank pain with n/v.  Troponin  increased from baseline.  CT ok.  UA/wet prep with trichomonas -? PID form trichomonas.  Cardiology is consulting, recommend Echo.  Dig level 1.2.     Review of Systems: As mentioned in the history of present illness. All other systems reviewed and are negative. Past Medical History:  Diagnosis Date   Acute respiratory failure (HCC) 05/05/2018   Acute systolic congestive heart failure (HCC) 02/03/2018   AICD (automatic cardioverter/defibrillator) present    Allergy    Anxiety    Asthma    DM2 (diabetes mellitus, type 2) (HCC) 10/20/2020   Hypertension    PAF (paroxysmal atrial fibrillation) (HCC)    Presence of permanent cardiac pacemaker    Prophylactic measure 08/03/14-08/19/14   Prophyl. 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   LIMA to LAD, SVG to D1, SVG to OM1, EVH via right thigh with implantation of Impella LD LVAD via direct aortic approach   Small cell lung cancer (HCC) 03/16/2014   Past Surgical History:  Procedure Laterality Date   BRONCHIAL BRUSHINGS  10/25/2020   Procedure: BRONCHIAL BRUSHINGS;  Surgeon: Leslye Peer, MD;  Location: [facility] ENDOSCOPY;  Service: Pulmonary;;   BRONCHIAL NEEDLE ASPIRATION BIOPSY   10/25/2020   Procedure: BRONCHIAL NEEDLE ASPIRATION BIOPSIES;  Surgeon: Leslye Peer, MD;  Location: [facility] ENDOSCOPY;  Service: Pulmonary;;   CARDIAC DEFIBRILLATOR PLACEMENT  08/15/2018   MDT Visia AF MRI VR ICD implanted by Dr Georgena Spurling for primary prevention of sudden   CESAREAN SECTION     CORONARY ARTERY BYPASS GRAFT N/A 02/03/2018   Procedure: CORONARY ARTERY BYPASS GRAFTING (CABG);  Surgeon: Purcell Nails, MD;  Location: [facility] OR;  Service: Open Heart Surgery;  Laterality: N/A;  Time 3 using left internal mammary artery and endoscopically harvested right saphenous vein   CORONARY BALLOON ANGIOPLASTY N/A 02/03/2018   Procedure: CORONARY BALLOON ANGIOPLASTY;  Surgeon: Swaziland, Peter M, MD;  Location: [facility] INVASIVE CV LAB;  Service: Cardiovascular;  Laterality: N/A;   CORONARY STENT INTERVENTION N/A 02/12/2019   Procedure: CORONARY STENT INTERVENTION;  Surgeon: Iran Ouch, MD;  Location: [facility] INVASIVE CV LAB;  Service: Cardiovascular;  Laterality: N/A;   CORONARY/GRAFT ACUTE MI REVASCULARIZATION N/A 02/03/2018   Procedure: Coronary/Graft Acute MI Revascularization;  Surgeon: Swaziland, Peter M, MD;  Location: [facility] INVASIVE CV LAB;  Service: Cardiovascular;  Laterality: N/A;   ENDOBRONCHIAL ULTRASOUND N/A 10/25/2020   Procedure: ENDOBRONCHIAL ULTRASOUND;  Surgeon: Leslye Peer, MD;  Location: [facility] ENDOSCOPY;  Service: Pulmonary;  Laterality: N/A;   FLEXIBLE BRONCHOSCOPY  10/25/2020   Procedure: FLEXIBLE BRONCHOSCOPY;  Surgeon: Leslye Peer, MD;  Location: [facility] ENDOSCOPY;  Service: Pulmonary;;   IABP INSERTION N/A 02/03/2018  Procedure: IABP Insertion;  Surgeon: Swaziland, Peter M, MD;  Location: [facility] INVASIVE CV LAB;  Service: Cardiovascular;  Laterality: N/A;   INTRAOPERATIVE TRANSESOPHAGEAL ECHOCARDIOGRAM N/A 02/03/2018   Procedure: INTRAOPERATIVE TRANSESOPHAGEAL ECHOCARDIOGRAM;  Surgeon: Purcell Nails, MD;  Location: [facility] OR;  Service: Open Heart Surgery;  Laterality: N/A;   LEFT HEART CATH AND CORONARY  ANGIOGRAPHY N/A 02/03/2018   Procedure: LEFT HEART CATH AND CORONARY ANGIOGRAPHY;  Surgeon: Swaziland, Peter M, MD;  Location: [facility] INVASIVE CV LAB;  Service: Cardiovascular;  Laterality: N/A;   LEFT HEART CATH AND CORS/GRAFTS ANGIOGRAPHY N/A 02/12/2019   Procedure: LEFT HEART CATH AND CORS/GRAFTS ANGIOGRAPHY;  Surgeon: Iran Ouch, MD;  Location: [facility] INVASIVE CV LAB;  Service: Cardiovascular;  Laterality: N/A;   MEDIASTINOSCOPY N/A 03/11/2014   Procedure: MEDIASTINOSCOPY;  Surgeon: Loreli Slot, MD;  Location: [facility] OR;  Service: Thoracic;  Laterality: N/A;   PLACEMENT OF IMPELLA LEFT VENTRICULAR ASSIST DEVICE  02/03/2018   Procedure: PLACEMENT OF IMPELLA LEFT VENTRICULAR ASSIST DEVICE LD;  Surgeon: Purcell Nails, MD;  Location: [facility] OR;  Service: Open Heart Surgery;;   REMOVAL OF IMPELLA LEFT VENTRICULAR ASSIST DEVICE N/A 02/08/2018   Procedure: REMOVAL OF IMPELLA LEFT VENTRICULAR ASSIST DEVICE;  Surgeon: Purcell Nails, MD;  Location: [facility] OR;  Service: Open Heart Surgery;  Laterality: N/A;   RIGHT HEART CATH N/A 02/03/2018   Procedure: RIGHT HEART CATH;  Surgeon: Swaziland, Peter M, MD;  Location: [facility] INVASIVE CV LAB;  Service: Cardiovascular;  Laterality: N/A;   RIGHT HEART CATH N/A 05/09/2018   Procedure: RIGHT HEART CATH;  Surgeon: Dolores Patty, MD;  Location: [facility] INVASIVE CV LAB;  Service: Cardiovascular;  Laterality: N/A;   TEE WITHOUT CARDIOVERSION N/A 02/08/2018   Procedure: TRANSESOPHAGEAL ECHOCARDIOGRAM (TEE);  Surgeon: Purcell Nails, MD;  Location: [facility] OR;  Service: Open Heart Surgery;  Laterality: N/A;   TUBAL LIGATION     VIDEO BRONCHOSCOPY WITH ENDOBRONCHIAL ULTRASOUND N/A 03/11/2014   Procedure: VIDEO BRONCHOSCOPY WITH ENDOBRONCHIAL ULTRASOUND;  Surgeon: Loreli Slot, MD;  Location: [facility] OR;  Service: Thoracic;  Laterality: N/A;   Social History:  reports that she quit smoking about 9 years ago. Her smoking use included cigarettes. She has a 20.00 pack-year smoking history.  She has never used smokeless tobacco. She reports current alcohol use of about 5.0 standard drinks of alcohol per week. She reports current drug use. Drug: Marijuana.  Allergies  Allergen Reactions   Codeine Nausea And Vomiting    Family History  Problem Relation Age of Onset   Heart disease Mother    Hypertension Mother    Heart attack Mother    Hypertension Maternal Grandmother    Cancer Maternal Grandmother    Diabetes Paternal Grandmother    Stroke Neg Hx     Prior to Admission medications   Medication Sig Start Date End Date Taking? Authorizing Provider  albuterol (VENTOLIN HFA) 108 (90 Base) MCG/ACT inhaler Inhale 2 puffs into the lungs every 6 (six) hours as needed for wheezing or shortness of breath. 06/20/22  Yes Zannie Cove, MD  apixaban (ELIQUIS) 2.5 MG TABS tablet Take 1 tablet (2.5 mg total) by mouth 2 (two) times daily. 04/17/23  Yes Clegg, Amy D, NP  dapagliflozin propanediol (FARXIGA) 10 MG TABS tablet Take 1 tablet by mouth daily. 09/04/22  Yes Bensimhon, Bevelyn Buckles, MD  digoxin (LANOXIN) 0.125 MG tablet Take 1 tablet (0.125 mg total) by mouth daily. 03/28/23  Yes [name], Anderson Malta, FNP  Fluticasone-Umeclidin-Vilant (TRELEGY  ELLIPTA) 200-62.5-25 MCG/ACT AEPB Inhale 1 puff into the lungs daily. 04/11/23  Yes Olalere, Adewale A, MD  ipratropium-albuterol (DUONEB) 0.5-2.5 (3) MG/3ML SOLN Take 3 mLs by nebulization every 4 (four) hours as needed (for shortness of breath). 07/12/22  Yes Laurey Morale, MD  ivabradine (CORLANOR) 7.5 MG TABS tablet Take 1 tablet (7.5 mg total) by mouth 2 (two) times daily with a meal. TAKE 1 TABLET (7.5 MG TOTAL) BY MOUTH 2 (TWO) TIMES DAILY WITH A MEAL. 10/10/22  Yes Bensimhon, Bevelyn Buckles, MD  metolazone (ZAROXOLYN) 2.5 MG tablet Take 1 tablet (2.5 mg total) by mouth as directed. 03/07/23  Yes Bensimhon, Bevelyn Buckles, MD  Multiple Vitamin (MULTIVITAMIN WITH MINERALS) TABS tablet Take 1 tablet by mouth daily. 12/15/21  Yes Hongalgi, Maximino Greenland, MD   naphazoline-pheniramine (VISINE) 0.025-0.3 % ophthalmic solution Place 1 drop into both eyes 4 (four) times daily as needed for eye irritation.   Yes [provider]  nitroGLYCERIN (NITROSTAT) 0.4 MG SL tablet Place 1 tablet (0.4 mg total) under the tongue every 5 (five) minutes as needed for chest pain. 02/14/19  Yes Graciella Freer, PA-C  Nutritional Supplements (ENSURE ORIGINAL) LIQD Take 2 Bottles by mouth daily.   Yes [provider]  potassium chloride (KLOR-CON M) 10 MEQ tablet Take 4 tablets (40 mEq total) by mouth 2 (two) times daily. Take extra 4 tablets (40 meq total) when you take PRN metolazone. Patient taking differently: Take 30 mEq by mouth 2 (two) times daily. Take extra 4 tablets (40 meq total) when you take PRN metolazone. 01/15/23  Yes [name], Anderson Malta, FNP  spironolactone (ALDACTONE) 25 MG tablet Take 1 tablet (25 mg total) by mouth daily. 03/28/23  Yes [name], Anderson Malta, FNP  torsemide (DEMADEX) 20 MG tablet Take 60 mg by mouth daily.   Yes [provider]  blood glucose meter kit and supplies KIT Dispense based on patient and insurance preference. Use up to four times daily as directed. 04/11/21   Osvaldo Shipper, MD    Physical Exam: Vitals:   04/19/23 1030 04/19/23 1045 04/19/23 1147 04/19/23 1555  BP: (!) 114/58 (!) 113/55 121/72 116/64  Pulse: 91 93 95 93  Resp:   20 17  Temp:   98.9 F (37.2 C) 98.7 F (37.1 C)  TempSrc:    Oral  SpO2: 91% 91%  93%  Weight:      Height:       General:  Appears miserable with n/v Eyes:   EOMI, normal lids, iris ENT:  grossly normal hearing, lips & tongue, mmm Neck:  no LAD, masses or thyromegaly Cardiovascular:  RRR, no m/r/g. No LE edema.  Respiratory:   CTA bilaterally with no wheezes/rales/rhonchi.  Normal respiratory effort. Abdomen:  soft, NT, ND Skin:  no rash or induration seen on limited exam Musculoskeletal:  grossly normal tone BUE/BLE, good ROM, no bony abnormality Psychiatric:   blunted mood and affect, speech fluent and appropriate, AOx3 Neurologic:  CN 2-12 grossly intact, moves all extremities in coordinated fashion   Radiological Exams on Admission: Independently reviewed - see discussion in A/P where applicable  CT RENAL STONE STUDY  Result Date: 04/18/2023 CLINICAL DATA:  Left-sided flank pain for several hours, initial encounter EXAM: CT ABDOMEN AND PELVIS WITHOUT CONTRAST TECHNIQUE: Multidetector CT imaging of the abdomen and pelvis was performed following the standard protocol without IV contrast. RADIATION DOSE REDUCTION: This exam was performed according to the departmental dose-optimization program which includes automated exposure control, adjustment of  the mA and/or kV according to patient size and/or use of iterative reconstruction technique. COMPARISON:  12/10/2021 FINDINGS: Lower chest: Mild scarring is noted in the bases bilaterally stable from the prior exam. Hepatobiliary: No focal liver abnormality is seen. No gallstones, gallbladder wall thickening, or biliary dilatation. Pancreas: Unremarkable. No pancreatic ductal dilatation or surrounding inflammatory changes. Spleen: Normal in size without focal abnormality. Adrenals/Urinary Tract: Adrenal glands are within normal limits. No renal calculi or obstructive changes are noted. No focal mass is seen. The bladder is decompressed. Stomach/Bowel: No obstructive or inflammatory changes of the colon are seen. The appendix is within normal limits. Small bowel and stomach are within normal limits. Vascular/Lymphatic: Aortic atherosclerosis. No enlarged abdominal or pelvic lymph nodes. Reproductive: Status post hysterectomy. No adnexal masses. Other: No abdominal wall hernia or abnormality. No abdominopelvic ascites. Musculoskeletal: No acute or significant osseous findings. IMPRESSION: No acute abnormality is noted. Electronically Signed   By: Alcide Clever M.D.   On: 04/18/2023 23:59   DG Chest Portable 1  View  Result Date: 04/18/2023 CLINICAL DATA:  Shortness of breath. EXAM: PORTABLE CHEST 1 VIEW COMPARISON:  Chest radiograph dated 03/03/2023. FINDINGS: No focal consolidation, pleural effusion, pneumothorax. The lungs are hyperinflated with flattening of the diaphragms. Mild cardiomegaly. Median sternotomy wires, CABG vascular clips, and left pectoral AICD device. Stimulator device over the right chest. No acute osseous pathology. IMPRESSION: 1. No active disease. 2. Mild cardiomegaly. Electronically Signed   By: Elgie Collard M.D.   On: 04/18/2023 22:54    EKG: Independently reviewed.   4 - NSR with rate 87; prolonged QTc 501; LVH with significant artifact but no obvious STEMI  Labs on Admission: I have personally reviewed the available labs and imaging studies at the time of the admission.  Pertinent labs:    Glucose 117, 167 BUN 28/Creatinine 1.63/GFR 35; 36/1.14/54 on 4/4 BNP 1203.6 HS troponin 325, 413, 504, 452 WBC 12.5 Hgb 15.6 Lactate 1.2 Wet prep + trichomonas, >10 WBC UA: >500 glucose, 100 protein   Assessment and Plan: Principal Problem:   NSTEMI (non-ST elevated myocardial infarction) (HCC) Active Problems:   Chronic HFrEF (heart failure with reduced ejection fraction) (HCC)   AF (paroxysmal atrial fibrillation) (HCC)   Essential hypertension   ICD (implantable cardioverter-defibrillator) in place   Prolonged QT interval   Trichomonas infection   CKD (chronic kidney disease) stage 3, GFR 30-59 ml/min (HCC)    NSTEMI -Patient with L-sided flank pain with persistent n/v; based on unremarkable evaluation otherwise, this is concerning for angina equivalent -CXR unremarkable.   -Initial HS troponin positive; repeat with positive and still uptrending delta.   -EKG with no apparent STEMI. -Brittany admit since the patient has positive troponins and/or an abnormal EKG with angina necessitating acute intervention. -She was started on heparin infusion -Risk factor  stratification with HgbA1c and FLP; Brittany also check TSH and UDS -Cardiology consultation requested -NTG for symptom relief (although there is no mortality benefit) -Start ASA  AKI on stage 3a CKD -Mildly worse than baseline -Hold both IVF and diuretics for now and follow clinically -Attempt to avoid nephrotoxic medications -Recheck BMP in AM   HTN -Hold diuretics for now  HLD -Start high-dose atorvastatin for now  DM -Diet controlled -Check A1c  Trichomonas infection -Unlikely to be the cause of her symptoms -She strictly denies sexual relations for years -Treat with IV Flagyl for now  Chronic systolic CHF -Has AICD, pacer -Echo is pending -Appears to be mildly hypovolemic but subtly so -  Brittany hold both fluids and diuretics and follow clinically -Hold Farxiga, Ivabradine -She is enrolled in a research protocol  Afib -Rate controlled with Digoxin -Eliquis is on hold while patient is on heparin  COPD -Continue Trelegy, prn Albuterol  Prolonged QTc -Likely associated with dehydration, anticipate resolution once volume status is normalized -Brittany attempt to avoid QT-prolonging medications such as PPI, nausea meds, SSRIs -Repeat EKG in AM     Advance Care Planning:   Code Status: Full Code - Code status was discussed with the patient and/or family at the time of admission.  The patient would want to receive full resuscitative measures at this time.   Consults: Cardiology; [facility] team; nutrition  DVT Prophylaxis: Heparin infusion  Family Communication: None present; she declined to have me call family at the time of admission  Severity of Illness: The appropriate patient status for this patient is INPATIENT. Inpatient status is judged to be reasonable and necessary in order to provide the required intensity of service to ensure the patient's safety. The patient's presenting symptoms, physical exam findings, and initial radiographic and laboratory data in the context of  their chronic comorbidities is felt to place them at high risk for further clinical deterioration. Furthermore, it is not anticipated that the patient Brittany be medically stable for discharge from the hospital within 2 midnights of admission.   * I certify that at the point of admission it is my clinical judgment that the patient Brittany require inpatient hospital care spanning beyond 2 midnights from the point of admission due to high intensity of service, high risk for further deterioration and high frequency of surveillance required.*  Author: Jonah Blue, MD 04/19/2023 5:35 PM  For on call review [email or web address].

## 2023-04-19 NOTE — Progress Notes (Signed)
ANTICOAGULATION CONSULT NOTE - Initial Consult  Pharmacy Consult for Heparin (Apixaban on hold) Indication: chest pain/ACS and atrial fibrillation  Allergies  Allergen Reactions   Codeine Nausea And Vomiting    Vital Signs: Temp: 98.4 F (36.9 C) (05/09 0315) Temp Source: Oral (05/09 0315) BP: 123/65 (05/09 0300) Pulse Rate: 93 (05/09 0300)  Labs: Recent Labs    04/18/23 2240 04/19/23 0107  HGB 15.6*  --   HCT 49.3*  --   PLT 259  --   CREATININE 1.46*  --   TROPONINIHS 325* 413*    Estimated Creatinine Clearance: 27.2 mL/min (A) (by C-G formula based on SCr of 1.46 mg/dL (H)).   Medical History: Past Medical History:  Diagnosis Date   Acute respiratory failure (HCC) 05/05/2018   Acute systolic congestive heart failure (HCC) 02/03/2018   AICD (automatic cardioverter/defibrillator) present    Allergy    Anxiety    Asthma    DM2 (diabetes mellitus, type 2) (HCC) 10/20/2020   Hypertension    PAF (paroxysmal atrial fibrillation) (HCC)    Presence of permanent cardiac pacemaker    Prophylactic measure 08/03/14-08/19/14   Prophyl. 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   LIMA to LAD, SVG to D1, SVG to OM1, EVH via right thigh with implantation of Impella LD LVAD via direct aortic approach   Small cell lung cancer (HCC) 03/16/2014    Assessment: 62 y/o F with abdominal and flank pain. Found to have elevated troponin. On apxiaban PTA for afib. Starting heparin and holding apixaban. Last apixaban dose per med rec 5/7 0800, ok to start heparin now. Anticipate using aPTT to dose for now. CBC good.   Goal of Therapy:  Heparin level 0.3-0.7 units/ml aPTT 66-102 seconds Monitor platelets by anticoagulation protocol: Yes   Plan:  Start heparin drip at 500 units/hr 1500 aPTT and heparin level Daily CBC, heparin level, and aPTT Monitor for bleeding  Abran Duke, PharmD, BCPS Clinical Pharmacist Phone: [phone]

## 2023-04-19 NOTE — Consult Note (Signed)
Cardiology Consultation:   Patient ID: Brittany [name] MRN: [id number]; DOB: 07/08/1961  Admit date: 04/18/2023 Date of Consult: 04/19/2023  Primary Care Provider: Pcp, No Primary Cardiologist: Bensihmon Primary Electrophysiologist:  Hillis Range, MD (Inactive)    Patient Profile:   Brittany [name] is a 62 y.o. female with a hx of COPD, chronic hypoxic respiratory failure, chronic systolic and diastolic heart failure, A-fib, coronary disease status post CABG  who is being seen today for the evaluation of elevated troponin at the request of emergency dept.  History of Present Illness:   [name] is a 62 y.o. female who is a 54 female with COPD, chronic hypoxic respiratory failure, chronic systolic and diastolic heart failure, A-fib, coronary disease status post CABG who presents with 1 day of left-sided abdominal pain and severe flank pain.  Has been having nausea and vomiting as well.  Notes that she is otherwise in her usual state of health and is developed over the last day.  She has been not able to tolerate much p.o. and having a lot of vomiting.  Notes that she does have intermittent chest squeezing but mostly her pain is severe in her left side abdomen and flank.   In the emergency department,  she was hemodynamically stable and in severe pain.  Initial workup found urinalysis concerning for urinary infection.  Cardiology was consulted because in the lab eval she was troponin that was elevated.     Past Medical History:  Diagnosis Date   Acute respiratory failure (HCC) 05/05/2018   Acute systolic congestive heart failure (HCC) 02/03/2018   AICD (automatic cardioverter/defibrillator) present    Allergy    Anxiety    Asthma    DM2 (diabetes mellitus, type 2) (HCC) 10/20/2020   Hypertension    PAF (paroxysmal atrial fibrillation) (HCC)    Presence of permanent cardiac pacemaker    Prophylactic measure 08/03/14-08/19/14   Prophyl. 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   LIMA to LAD, SVG to D1, SVG to OM1, EVH via right thigh with implantation of Impella LD LVAD via direct aortic approach   Small cell lung cancer (HCC) 03/16/2014     Past Surgical History:  Procedure Laterality Date   BRONCHIAL BRUSHINGS  10/25/2020   Procedure: BRONCHIAL BRUSHINGS;  Surgeon: Leslye Peer, MD;  Location: [facility] ENDOSCOPY;  Service: Pulmonary;;   BRONCHIAL NEEDLE ASPIRATION BIOPSY  10/25/2020   Procedure: BRONCHIAL NEEDLE ASPIRATION BIOPSIES;  Surgeon: Leslye Peer, MD;  Location: [facility] ENDOSCOPY;  Service: Pulmonary;;   CARDIAC DEFIBRILLATOR PLACEMENT  08/15/2018   MDT Visia AF MRI VR ICD implanted by Dr Georgena Spurling for primary prevention of sudden   CESAREAN SECTION     CORONARY ARTERY BYPASS GRAFT N/A 02/03/2018   Procedure: CORONARY ARTERY BYPASS GRAFTING (CABG);  Surgeon: Purcell Nails, MD;  Location: [facility];  Service: Open Heart Surgery;  Laterality: N/A;  Time 3 using left internal mammary artery and endoscopically harvested right saphenous vein   CORONARY BALLOON ANGIOPLASTY N/A 02/03/2018   Procedure: CORONARY BALLOON ANGIOPLASTY;  Surgeon: Swaziland, Peter M, MD;  Location: [facility] INVASIVE CV LAB;  Service: Cardiovascular;  Laterality: N/A;   CORONARY STENT INTERVENTION N/A 02/12/2019   Procedure: CORONARY STENT INTERVENTION;  Surgeon: Iran Ouch, MD;  Location: [facility] INVASIVE CV LAB;  Service: Cardiovascular;  Laterality: N/A;   CORONARY/GRAFT ACUTE MI REVASCULARIZATION N/A 02/03/2018   Procedure: Coronary/Graft Acute MI Revascularization;  Surgeon: Swaziland, Peter M, MD;  Location: [facility] INVASIVE  CV LAB;  Service: Cardiovascular;  Laterality: N/A;   ENDOBRONCHIAL ULTRASOUND N/A 10/25/2020   Procedure: ENDOBRONCHIAL ULTRASOUND;  Surgeon: Leslye Peer, MD;  Location: [facility] ENDOSCOPY;  Service: Pulmonary;  Laterality: N/A;   FLEXIBLE BRONCHOSCOPY  10/25/2020   Procedure: FLEXIBLE BRONCHOSCOPY;  Surgeon: Leslye Peer, MD;  Location: [facility]  ENDOSCOPY;  Service: Pulmonary;;   IABP INSERTION N/A 02/03/2018   Procedure: IABP Insertion;  Surgeon: Swaziland, Peter M, MD;  Location: [facility] INVASIVE CV LAB;  Service: Cardiovascular;  Laterality: N/A;   INTRAOPERATIVE TRANSESOPHAGEAL ECHOCARDIOGRAM N/A 02/03/2018   Procedure: INTRAOPERATIVE TRANSESOPHAGEAL ECHOCARDIOGRAM;  Surgeon: Purcell Nails, MD;  Location: [facility] OR;  Service: Open Heart Surgery;  Laterality: N/A;   LEFT HEART CATH AND CORONARY ANGIOGRAPHY N/A 02/03/2018   Procedure: LEFT HEART CATH AND CORONARY ANGIOGRAPHY;  Surgeon: Swaziland, Peter M, MD;  Location: [facility] INVASIVE CV LAB;  Service: Cardiovascular;  Laterality: N/A;   LEFT HEART CATH AND CORS/GRAFTS ANGIOGRAPHY N/A 02/12/2019   Procedure: LEFT HEART CATH AND CORS/GRAFTS ANGIOGRAPHY;  Surgeon: Iran Ouch, MD;  Location: [facility] INVASIVE CV LAB;  Service: Cardiovascular;  Laterality: N/A;   MEDIASTINOSCOPY N/A 03/11/2014   Procedure: MEDIASTINOSCOPY;  Surgeon: Loreli Slot, MD;  Location: [facility] OR;  Service: Thoracic;  Laterality: N/A;   PLACEMENT OF IMPELLA LEFT VENTRICULAR ASSIST DEVICE  02/03/2018   Procedure: PLACEMENT OF IMPELLA LEFT VENTRICULAR ASSIST DEVICE LD;  Surgeon: Purcell Nails, MD;  Location: [facility] OR;  Service: Open Heart Surgery;;   REMOVAL OF IMPELLA LEFT VENTRICULAR ASSIST DEVICE N/A 02/08/2018   Procedure: REMOVAL OF IMPELLA LEFT VENTRICULAR ASSIST DEVICE;  Surgeon: Purcell Nails, MD;  Location: [facility] OR;  Service: Open Heart Surgery;  Laterality: N/A;   RIGHT HEART CATH N/A 02/03/2018   Procedure: RIGHT HEART CATH;  Surgeon: Swaziland, Peter M, MD;  Location: [facility] INVASIVE CV LAB;  Service: Cardiovascular;  Laterality: N/A;   RIGHT HEART CATH N/A 05/09/2018   Procedure: RIGHT HEART CATH;  Surgeon: Dolores Patty, MD;  Location: [facility] INVASIVE CV LAB;  Service: Cardiovascular;  Laterality: N/A;   TEE WITHOUT CARDIOVERSION N/A 02/08/2018   Procedure: TRANSESOPHAGEAL ECHOCARDIOGRAM (TEE);  Surgeon: Purcell Nails, MD;   Location: [facility] OR;  Service: Open Heart Surgery;  Laterality: N/A;   TUBAL LIGATION     VIDEO BRONCHOSCOPY WITH ENDOBRONCHIAL ULTRASOUND N/A 03/11/2014   Procedure: VIDEO BRONCHOSCOPY WITH ENDOBRONCHIAL ULTRASOUND;  Surgeon: Loreli Slot, MD;  Location: [facility] OR;  Service: Thoracic;  Laterality: N/A;     Home Medications:  Prior to Admission medications   Medication Sig Start Date End Date Taking? Authorizing Provider  albuterol (VENTOLIN HFA) 108 (90 Base) MCG/ACT inhaler Inhale 2 puffs into the lungs every 6 (six) hours as needed for wheezing or shortness of breath. 06/20/22  Yes Zannie Cove, MD  apixaban (ELIQUIS) 2.5 MG TABS tablet Take 1 tablet (2.5 mg total) by mouth 2 (two) times daily. 04/17/23  Yes Clegg, Amy D, NP  dapagliflozin propanediol (FARXIGA) 10 MG TABS tablet Take 1 tablet by mouth daily. 09/04/22  Yes Bensimhon, Bevelyn Buckles, MD  digoxin (LANOXIN) 0.125 MG tablet Take 1 tablet (0.125 mg total) by mouth daily. 03/28/23  Yes [name], Anderson Malta, FNP  Fluticasone-Umeclidin-Vilant (TRELEGY ELLIPTA) 200-62.5-25 MCG/ACT AEPB Inhale 1 puff into the lungs daily. 04/11/23  Yes Olalere, Adewale A, MD  ipratropium-albuterol (DUONEB) 0.5-2.5 (3) MG/3ML SOLN Take 3 mLs by nebulization every 4 (four) hours as needed (for shortness of breath). 07/12/22  Yes Marca Ancona  S, MD  ivabradine (CORLANOR) 7.5 MG TABS tablet Take 1 tablet (7.5 mg total) by mouth 2 (two) times daily with a meal. TAKE 1 TABLET (7.5 MG TOTAL) BY MOUTH 2 (TWO) TIMES DAILY WITH A MEAL. 10/10/22  Yes Bensimhon, Bevelyn Buckles, MD  metolazone (ZAROXOLYN) 2.5 MG tablet Take 1 tablet (2.5 mg total) by mouth as directed. 03/07/23  Yes Bensimhon, Bevelyn Buckles, MD  Multiple Vitamin (MULTIVITAMIN WITH MINERALS) TABS tablet Take 1 tablet by mouth daily. 12/15/21  Yes Hongalgi, Maximino Greenland, MD  naphazoline-pheniramine (VISINE) 0.025-0.3 % ophthalmic solution Place 1 drop into both eyes 4 (four) times daily as needed for eye irritation.   Yes [provider]  nitroGLYCERIN (NITROSTAT) 0.4 MG SL tablet Place 1 tablet (0.4 mg total) under the tongue every 5 (five) minutes as needed for chest pain. 02/14/19  Yes Graciella Freer, PA-C  Nutritional Supplements (ENSURE ORIGINAL) LIQD Take 2 Bottles by mouth daily.   Yes [provider]  potassium chloride (KLOR-CON M) 10 MEQ tablet Take 4 tablets (40 mEq total) by mouth 2 (two) times daily. Take extra 4 tablets (40 meq total) when you take PRN metolazone. Patient taking differently: Take 30 mEq by mouth 2 (two) times daily. Take extra 4 tablets (40 meq total) when you take PRN metolazone. 01/15/23  Yes [name], Anderson Malta, FNP  spironolactone (ALDACTONE) 25 MG tablet Take 1 tablet (25 mg total) by mouth daily. 03/28/23  Yes [name], Anderson Malta, FNP  torsemide (DEMADEX) 20 MG tablet Take 60 mg by mouth daily.   Yes [provider]  blood glucose meter kit and supplies KIT Dispense based on patient and insurance preference. Use up to four times daily as directed. 04/11/21   Osvaldo Shipper, MD    Inpatient Medications: Scheduled Meds:   HYDROmorphone (DILAUDID) injection  0.5 mg Intravenous Once   Continuous Infusions:  metronidazole Stopped (04/19/23 0344)   PRN Meds:   Allergies:    Allergies  Allergen Reactions   Codeine Nausea And Vomiting    Social History:   Social History   Socioeconomic History   Marital status: Married    Spouse name: Tristan Schroeder    Number of children: 1   Years of education: Not on file   Highest education level: Not on file  Occupational History   Not on file  Tobacco Use   Smoking status: Former    Packs/day: 1.00    Years: 20.00    Additional pack years: 0.00    Total pack years: 20.00    Types: Cigarettes    Quit date: 03/13/2014    Years since quitting: 9.1   Smokeless tobacco: Never  Vaping Use   Vaping Use: Never used  Substance and Sexual Activity   Alcohol use: Yes    Alcohol/week: 5.0 standard drinks of  alcohol    Types: 5 Glasses of wine per week   Drug use: Yes    Types: Marijuana    Comment: "medicinal"   Sexual activity: Never  Other Topics Concern   Not on file  Social History Narrative   Not on file   Social Determinants of Health   Financial Resource Strain: Medium Risk (02/11/2021)   Overall Financial Resource Strain (CARDIA)    Difficulty of Paying Living Expenses: Somewhat hard  Food Insecurity: No Food Insecurity (03/01/2023)   Hunger Vital Sign    Worried About Running Out of Food in the Last Year: Never true    Ran Out of Food  in the Last Year: Never true  Transportation Needs: Patient Declined (03/01/2023)   PRAPARE - Transportation    Lack of Transportation (Medical): Patient declined    Lack of Transportation (Non-Medical): Patient declined  Physical Activity: Insufficiently Active (02/11/2021)   Exercise Vital Sign    Days of Exercise per Week: 2 days    Minutes of Exercise per Session: 10 min  Stress: Stress Concern Present (02/11/2021)   [employer] of Occupational Health - Occupational Stress Questionnaire    Feeling of Stress : To some extent  Social Connections: Unknown (02/11/2021)   Social Connection and Isolation Panel [NHANES]    Frequency of Communication with Friends and Family: More than three times a week    Frequency of Social Gatherings with Friends and Family: Twice a week    Attends Religious Services: Not on Marketing executive or Organizations: Not on file    Attends Banker Meetings: Never    Marital Status: Married  Catering manager Violence: Not At Risk (03/01/2023)   Humiliation, Afraid, Rape, and Kick questionnaire    Fear of Current or Ex-Partner: No    Emotionally Abused: No    Physically Abused: No    Sexually Abused: No    Family History:    Family History  Problem Relation Age of Onset   Heart disease Mother    Hypertension Mother    Heart attack Mother    Hypertension Maternal Grandmother     Cancer Maternal Grandmother    Diabetes Paternal Grandmother    Stroke Neg Hx      Review of Systems: All other review of systems are negative unless otherwise noted in the HPI as above.   Physical Exam/Data:   Vitals:   04/19/23 0145 04/19/23 0230 04/19/23 0300 04/19/23 0315  BP: 116/63 122/63 123/65   Pulse: 99 93 93   Resp: 18 12 14    Temp:    98.4 F (36.9 C)  TempSrc:    Oral  SpO2: 91% 95% 92%     Intake/Output Summary (Last 24 hours) at 04/19/2023 0438 Last data filed at 04/19/2023 0354 Gross per 24 hour  Intake 197.11 ml  Output --  Net 197.11 ml   There were no vitals filed for this visit. There is no height or weight on file to calculate BMI.  General appearance: alert and mild distress Neck: no JVD Heart: regular rate and rhythm, S1, S2 normal, no murmur, click, rub or gallop Abdomen: abnormal findings:  marked tenderness in the left flank Skin: Skin color, texture, turgor normal. No rashes or lesions Neurologic: Grossly normal  EKG:  The EKG was personally reviewed and demonstrates:  sinus Telemetry:  Telemetry was personally reviewed and demonstrates:  sinus  Relevant CV Studies: Echo July 2023: EF 20-25%  Laboratory Data:  Chemistry Recent Labs  Lab 04/18/23 2240  NA 135  K 5.3*  CL 101  CO2 23  GLUCOSE 117*  BUN 27*  CREATININE 1.46*  CALCIUM 9.2  GFRNONAA 40*  ANIONGAP 11    Recent Labs  Lab 04/18/23 2240  PROT 7.1  ALBUMIN 3.8  AST 73*  ALT 28  ALKPHOS 76  BILITOT 0.6   Hematology Recent Labs  Lab 04/18/23 2240  WBC 12.5*  RBC 5.49*  HGB 15.6*  HCT 49.3*  MCV 89.8  MCH 28.4  MCHC 31.6  RDW 20.7*  PLT 259   Cardiac EnzymesNo results for input(s): "TROPONINI" in the last 168 hours. No  results for input(s): "TROPIPOC" in the last 168 hours.  BNP Recent Labs  Lab 04/18/23 2240  BNP 1,203.6*    DDimer No results for input(s): "DDIMER" in the last 168 hours.  Radiology/Studies:  CT RENAL STONE STUDY  Result Date:  04/18/2023 CLINICAL DATA:  Left-sided flank pain for several hours, initial encounter EXAM: CT ABDOMEN AND PELVIS WITHOUT CONTRAST TECHNIQUE: Multidetector CT imaging of the abdomen and pelvis was performed following the standard protocol without IV contrast. RADIATION DOSE REDUCTION: This exam was performed according to the departmental dose-optimization program which includes automated exposure control, adjustment of the mA and/or kV according to patient size and/or use of iterative reconstruction technique. COMPARISON:  12/10/2021 FINDINGS: Lower chest: Mild scarring is noted in the bases bilaterally stable from the prior exam. Hepatobiliary: No focal liver abnormality is seen. No gallstones, gallbladder wall thickening, or biliary dilatation. Pancreas: Unremarkable. No pancreatic ductal dilatation or surrounding inflammatory changes. Spleen: Normal in size without focal abnormality. Adrenals/Urinary Tract: Adrenal glands are within normal limits. No renal calculi or obstructive changes are noted. No focal mass is seen. The bladder is decompressed. Stomach/Bowel: No obstructive or inflammatory changes of the colon are seen. The appendix is within normal limits. Small bowel and stomach are within normal limits. Vascular/Lymphatic: Aortic atherosclerosis. No enlarged abdominal or pelvic lymph nodes. Reproductive: Status post hysterectomy. No adnexal masses. Other: No abdominal wall hernia or abnormality. No abdominopelvic ascites. Musculoskeletal: No acute or significant osseous findings. IMPRESSION: No acute abnormality is noted. Electronically Signed   By: Alcide Clever M.D.   On: 04/18/2023 23:59   DG Chest Portable 1 View  Result Date: 04/18/2023 CLINICAL DATA:  Shortness of breath. EXAM: PORTABLE CHEST 1 VIEW COMPARISON:  Chest radiograph dated 03/03/2023. FINDINGS: No focal consolidation, pleural effusion, pneumothorax. The lungs are hyperinflated with flattening of the diaphragms. Mild cardiomegaly. Median  sternotomy wires, CABG vascular clips, and left pectoral AICD device. Stimulator device over the right chest. No acute osseous pathology. IMPRESSION: 1. No active disease. 2. Mild cardiomegaly. Electronically Signed   By: Elgie Collard M.D.   On: 04/18/2023 22:54    Assessment and Plan:   NSTEMI Believe that this is most likely secondary to infectious process that is going on.  I do not believe that at this time she is having a type I event.  I would not anticoagulate her for NSTEMI and rather just treat her infection and her current symptoms related to the infection.  However she is on anticoagulation regardless for atrial fibrillation.  Make sure that she is vomiting less and that her pain subsides.  And then after that if she is still having some of the chest pain symptoms then we could consider further evaluation.  But for now would call this type II event especially given her known history of complex coronary artery disease.  Will get an echo just to assess though we know that her EF has been reduced.  Recommendations: -Order echo for tomorrow -Monitor fluid levels closely       For questions or updates, please contact [facility] Please consult [email or web address] for contact info under     Signed, Joellen Jersey, MD  04/19/2023 4:38 AM

## 2023-04-19 NOTE — Progress Notes (Signed)
Patient refused telemetry. Notified CCMD and attempted to notify physician multiple times.

## 2023-04-19 NOTE — ED Notes (Signed)
Blue top tube drawn with IV start and sent to lab with save label prior to heparin administration.

## 2023-04-20 ENCOUNTER — Inpatient Hospital Stay (HOSPITAL_COMMUNITY): Payer: BC Managed Care – PPO

## 2023-04-20 ENCOUNTER — Other Ambulatory Visit: Payer: Self-pay

## 2023-04-20 DIAGNOSIS — I10 Essential (primary) hypertension: Secondary | ICD-10-CM | POA: Diagnosis not present

## 2023-04-20 DIAGNOSIS — I480 Paroxysmal atrial fibrillation: Secondary | ICD-10-CM | POA: Diagnosis not present

## 2023-04-20 DIAGNOSIS — R7989 Other specified abnormal findings of blood chemistry: Secondary | ICD-10-CM | POA: Diagnosis not present

## 2023-04-20 DIAGNOSIS — I2510 Atherosclerotic heart disease of native coronary artery without angina pectoris: Secondary | ICD-10-CM | POA: Diagnosis not present

## 2023-04-20 DIAGNOSIS — I5022 Chronic systolic (congestive) heart failure: Secondary | ICD-10-CM | POA: Diagnosis not present

## 2023-04-20 LAB — CBC
HCT: 44.7 % (ref 36.0–46.0)
HCT: 44.2 % (ref 36.0–46.0)
Hemoglobin: 14.6 g/dL (ref 12.0–15.0)
Hemoglobin: 13.8 g/dL (ref 12.0–15.0)
MCH: 28.9 pg (ref 26.0–34.0)
MCH: 28.2 pg (ref 26.0–34.0)
MCHC: 32.7 g/dL (ref 30.0–36.0)
MCHC: 31.2 g/dL (ref 30.0–36.0)
MCV: 88.5 fL (ref 80.0–100.0)
MCV: 90.4 fL (ref 80.0–100.0)
Platelets: 182 10*3/uL (ref 150–400)
Platelets: 178 10*3/uL (ref 150–400)
RBC: 5.05 MIL/uL (ref 3.87–5.11)
RBC: 4.89 MIL/uL (ref 3.87–5.11)
RDW: 20.1 % — ABNORMAL HIGH (ref 11.5–15.5)
RDW: 20.2 % — ABNORMAL HIGH (ref 11.5–15.5)
WBC: 17.2 10*3/uL — ABNORMAL HIGH (ref 4.0–10.5)
WBC: 17.4 10*3/uL — ABNORMAL HIGH (ref 4.0–10.5)
nRBC: 0 % (ref 0.0–0.2)
nRBC: 0 % (ref 0.0–0.2)

## 2023-04-20 LAB — ECHOCARDIOGRAM COMPLETE
AR max vel: 1.85 cm2
AV Area VTI: 2.03 cm2
AV Area mean vel: 1.7 cm2
AV Mean grad: 7 mmHg
AV Peak grad: 13.4 mmHg
Ao pk vel: 1.83 m/s
Area-P 1/2: 4.96 cm2
Height: 59 in
P 1/2 time: 236 msec
S' Lateral: 5.7 cm
Weight: 1626.11 oz

## 2023-04-20 LAB — BASIC METABOLIC PANEL
Anion gap: 10 (ref 5–15)
BUN: 27 mg/dL — ABNORMAL HIGH (ref 8–23)
CO2: 22 mmol/L (ref 22–32)
Calcium: 9 mg/dL (ref 8.9–10.3)
Chloride: 100 mmol/L (ref 98–111)
Creatinine, Ser: 1.63 mg/dL — ABNORMAL HIGH (ref 0.44–1.00)
GFR, Estimated: 35 mL/min — ABNORMAL LOW (ref >60)
Glucose, Bld: 130 mg/dL — ABNORMAL HIGH (ref 70–99)
Potassium: 4.5 mmol/L (ref 3.5–5.1)
Sodium: 132 mmol/L — ABNORMAL LOW (ref 135–145)

## 2023-04-20 LAB — APTT: aPTT: 70 seconds — ABNORMAL HIGH (ref 24–36)

## 2023-04-20 LAB — HEPARIN LEVEL (UNFRACTIONATED)
Heparin Unfractionated: 0.33 IU/mL (ref 0.30–0.70)
Heparin Unfractionated: 0.23 IU/mL — ABNORMAL LOW (ref 0.30–0.70)
Heparin Unfractionated: 0.19 IU/mL — ABNORMAL LOW (ref 0.30–0.70)

## 2023-04-20 LAB — RAPID URINE DRUG SCREEN, HOSP PERFORMED
Amphetamines: NOT DETECTED
Barbiturates: NOT DETECTED
Benzodiazepines: NOT DETECTED
Cocaine: NOT DETECTED
Opiates: POSITIVE — AB
Tetrahydrocannabinol: NOT DETECTED

## 2023-04-20 LAB — GLUCOSE, CAPILLARY
Glucose-Capillary: 159 mg/dL — ABNORMAL HIGH (ref 70–99)
Glucose-Capillary: 117 mg/dL — ABNORMAL HIGH (ref 70–99)
Glucose-Capillary: 146 mg/dL — ABNORMAL HIGH (ref 70–99)

## 2023-04-20 MED ORDER — PERFLUTREN LIPID MICROSPHERE
1.0000 mL | INTRAVENOUS | Status: AC | PRN
  Administered 2023-04-20: 2 mL via INTRAVENOUS

## 2023-04-20 MED ORDER — SODIUM CHLORIDE 0.9 % IV BOLUS
300.0000 mL | Freq: Once | INTRAVENOUS | Status: AC
  Administered 2023-04-20: 300 mL via INTRAVENOUS

## 2023-04-20 MED ORDER — INSULIN ASPART 100 UNIT/ML IJ SOLN: 0.0000 [IU] | Freq: Every day | INTRAMUSCULAR | Status: DC

## 2023-04-20 MED ORDER — ENSURE ENLIVE PO LIQD
237.0000 mL | Freq: Three times a day (TID) | ORAL | Status: DC
  Administered 2023-04-20 – 2023-04-22 (×5): 237 mL via ORAL

## 2023-04-20 MED ORDER — HYDROMORPHONE HCL 1 MG/ML IJ SOLN
0.5000 mg | INTRAMUSCULAR | Status: DC | PRN
  Administered 2023-04-20 – 2023-04-21 (×3): 0.5 mg via INTRAVENOUS
  Filled 2023-04-20 (×3): qty 0.5

## 2023-04-20 MED ORDER — HEPARIN (PORCINE) 25000 UT/250ML-% IV SOLN
950.0000 [IU]/h | INTRAVENOUS | Status: DC
  Administered 2023-04-20: 750 [IU]/h via INTRAVENOUS
  Filled 2023-04-20: qty 250

## 2023-04-20 MED ORDER — INSULIN ASPART 100 UNIT/ML IJ SOLN
0.0000 [IU] | Freq: Three times a day (TID) | INTRAMUSCULAR | Status: DC
  Administered 2023-04-21: 2 [IU] via SUBCUTANEOUS
  Administered 2023-04-22: 3 [IU] via SUBCUTANEOUS

## 2023-04-20 NOTE — Progress Notes (Signed)
ANTICOAGULATION CONSULT NOTE- follow-up  Pharmacy Consult for Heparin (Apixaban on hold) Indication: chest pain/ACS and atrial fibrillation  Allergies  Allergen Reactions   Lactose Intolerance (Gi)     Per patient   Codeine Nausea And Vomiting    Vital Signs: Temp: 99.8 F (37.7 C) (05/10 2013) Temp Source: Oral (05/10 2013) BP: 99/56 (05/10 2013) Pulse Rate: 86 (05/10 2013)  Labs: Recent Labs    04/18/23 2240 04/19/23 0107 04/19/23 0631 04/19/23 0830 04/19/23 1455 04/19/23 1455 04/20/23 0057 04/20/23 0924 04/20/23 1909  HGB 15.6*  --   --   --   --   --  14.6 13.8  --   HCT 49.3*  --   --   --   --   --  44.7 44.2  --   PLT 259  --   --   --   --   --  182 178  --   APTT  --   --   --   --  40*  --  70*  --   --   HEPARINUNFRC  --   --   --   --  0.13*   < > 0.33 0.23* 0.19*  CREATININE 1.46*  --  1.63*  --   --   --  1.63*  --   --   TROPONINIHS 325* 413* 504* 452*  --   --   --   --   --    < > = values in this interval not displayed.     Estimated Creatinine Clearance: 24.4 mL/min (A) (by C-G formula based on SCr of 1.63 mg/dL (H)).  Assessment: 62 y/o F with abdominal and flank pain. Found to have elevated troponin. On apxiaban PTA for afib. Starting heparin and holding apixaban. Last apixaban dose 5/7 AM per med rec. Heparin levels correlating with aPTT, and aPTT now d/c'd.  Heparin level resulted low at 0.19, decreased from previous after rate increase earlier today. CBC wnl. No bleeding or issues with infusion per discussion with RN.   Goal of Therapy:  Heparin level 0.3-0.7 units/ml Monitor platelets by anticoagulation protocol: Yes   Plan:  Increase heparin to 900 units/hr Check 6 hour heparin level Monitor daily CBC, s/sx bleeding   Leia Alf, PharmD, BCPS Please check AMION for all [facility] contact numbers Clinical Pharmacist 04/20/2023 8:54 PM

## 2023-04-20 NOTE — Progress Notes (Signed)
Rounding Note    Patient Name: Brittany [name] Date of Encounter: 04/20/2023  [facility] Cardiologist: None Bensimhon  Subjective   Continues to have abdominal pain.  No CP/SOB.   Inpatient Medications    Scheduled Meds:  aspirin EC  325 mg Oral Daily   atorvastatin  80 mg Oral Daily   digoxin  0.125 mg Oral Daily   feeding supplement  2 Bottle Oral Daily   fluticasone furoate-vilanterol  1 puff Inhalation Daily   And   umeclidinium bromide  1 puff Inhalation Daily   ivabradine  7.5 mg Oral BID WC   potassium chloride  30 mEq Oral BID   sodium chloride flush  3 mL Intravenous Q12H   Continuous Infusions:  heparin 650 Units/hr (04/19/23 1559)   metronidazole 500 mg (04/20/23 0307)   PRN Meds: acetaminophen **OR** acetaminophen, hydrALAZINE, HYDROmorphone (DILAUDID) injection, nitroGLYCERIN, ondansetron (ZOFRAN) IV, oxyCODONE, traZODone   Vital Signs    Vitals:   04/20/23 0051 04/20/23 0417 04/20/23 0418 04/20/23 0557  BP:   94/61   Pulse:   97   Resp:   18   Temp:   99.8 F (37.7 C)   TempSrc:   Oral   SpO2: 92% 96%    Weight:    46.1 kg  Height:        Intake/Output Summary (Last 24 hours) at 04/20/2023 0831 Last data filed at 04/20/2023 0039 Gross per 24 hour  Intake 628.33 ml  Output 250 ml  Net 378.33 ml      04/20/2023    5:57 AM 04/19/2023    7:37 AM 04/17/2023   12:30 PM  Last 3 Weights  Weight (lbs) 101 lb 10.1 oz 95 lb 14.4 oz 96 lb  Weight (kg) 46.1 kg 43.5 kg 43.545 kg      Telemetry    Sinus rhythm - Personally Reviewed  ECG    N/a - Personally Reviewed  Physical Exam   VS:  BP (!) 100/55 (BP Location: Right Arm)   Pulse 93   Temp 99.8 F (37.7 C) (Oral)   Resp 18   Ht 4\' 11"  (1.499 m)   Wt 46.1 kg   SpO2 95%   BMI 20.53 kg/m  , BMI Body mass index is 20.53 kg/m. GENERAL:  Well appearing HEENT: Pupils equal round and reactive, fundi not visualized, oral mucosa unremarkable NECK:  No jugular venous  distention, waveform within normal limits, carotid upstroke brisk and symmetric, no bruits, no thyromegaly LUNGS:  Clear to auscultation bilaterally HEART:  RRR.  PMI not displaced or sustained,S1 and S2 within normal limits, no S3, no S4, no clicks, no rubs, III/VI systolic murmur ABD:  Flat, positive bowel sounds normal in frequency in pitch, no bruits, no rebound, no guarding, no midline pulsatile mass, no hepatomegaly, no splenomegaly EXT:  2 plus pulses throughout, no edema, no cyanosis no clubbing SKIN:  No rashes no nodules NEURO:  Cranial nerves II through XII grossly intact, motor grossly intact throughout PSYCH:  Cognitively intact, oriented to person place and time   Labs    High Sensitivity Troponin:   Recent Labs  Lab 04/18/23 2240 04/19/23 0107 04/19/23 0631 04/19/23 0830  TROPONINIHS 325* 413* 504* 452*     Chemistry Recent Labs  Lab 04/18/23 2240 04/19/23 0631 04/20/23 0057  NA 135 133* 132*  K 5.3* 4.3 4.5  CL 101 99 100  CO2 23 23 22   GLUCOSE 117* 167* 130*  BUN 27* 28* 27*  CREATININE 1.46* 1.63* 1.63*  CALCIUM 9.2 9.2 9.0  MG 2.3  --   --   PROT 7.1  --   --   ALBUMIN 3.8  --   --   AST 73*  --   --   ALT 28  --   --   ALKPHOS 76  --   --   BILITOT 0.6  --   --   GFRNONAA 40* 35* 35*  ANIONGAP 11 11 10     Lipids  Recent Labs  Lab 04/19/23 1824  CHOL 293*  TRIG 78  HDL 53  LDLCALC 224*  CHOLHDL 5.5    Hematology Recent Labs  Lab 04/18/23 2240 04/20/23 0057  WBC 12.5* 17.2*  RBC 5.49* 5.05  HGB 15.6* 14.6  HCT 49.3* 44.7  MCV 89.8 88.5  MCH 28.4 28.9  MCHC 31.6 32.7  RDW 20.7* 20.1*  PLT 259 182   Thyroid  Recent Labs  Lab 04/19/23 1824  TSH 0.640    BNP Recent Labs  Lab 04/18/23 2240  BNP 1,203.6*    DDimer No results for input(s): "DDIMER" in the last 168 hours.   Radiology    CT RENAL STONE STUDY  Result Date: 04/18/2023 CLINICAL DATA:  Left-sided flank pain for several hours, initial encounter EXAM: CT  ABDOMEN AND PELVIS WITHOUT CONTRAST TECHNIQUE: Multidetector CT imaging of the abdomen and pelvis was performed following the standard protocol without IV contrast. RADIATION DOSE REDUCTION: This exam was performed according to the departmental dose-optimization program which includes automated exposure control, adjustment of the mA and/or kV according to patient size and/or use of iterative reconstruction technique. COMPARISON:  12/10/2021 FINDINGS: Lower chest: Mild scarring is noted in the bases bilaterally stable from the prior exam. Hepatobiliary: No focal liver abnormality is seen. No gallstones, gallbladder wall thickening, or biliary dilatation. Pancreas: Unremarkable. No pancreatic ductal dilatation or surrounding inflammatory changes. Spleen: Normal in size without focal abnormality. Adrenals/Urinary Tract: Adrenal glands are within normal limits. No renal calculi or obstructive changes are noted. No focal mass is seen. The bladder is decompressed. Stomach/Bowel: No obstructive or inflammatory changes of the colon are seen. The appendix is within normal limits. Small bowel and stomach are within normal limits. Vascular/Lymphatic: Aortic atherosclerosis. No enlarged abdominal or pelvic lymph nodes. Reproductive: Status post hysterectomy. No adnexal masses. Other: No abdominal wall hernia or abnormality. No abdominopelvic ascites. Musculoskeletal: No acute or significant osseous findings. IMPRESSION: No acute abnormality is noted. Electronically Signed   By: Alcide Clever M.D.   On: 04/18/2023 23:59   DG Chest Portable 1 View  Result Date: 04/18/2023 CLINICAL DATA:  Shortness of breath. EXAM: PORTABLE CHEST 1 VIEW COMPARISON:  Chest radiograph dated 03/03/2023. FINDINGS: No focal consolidation, pleural effusion, pneumothorax. The lungs are hyperinflated with flattening of the diaphragms. Mild cardiomegaly. Median sternotomy wires, CABG vascular clips, and left pectoral AICD device. Stimulator device over  the right chest. No acute osseous pathology. IMPRESSION: 1. No active disease. 2. Mild cardiomegaly. Electronically Signed   By: Elgie Collard M.D.   On: 04/18/2023 22:54    Cardiac Studies   Echo 07/10/22:    1. Left ventricular ejection fraction, by estimation, is 20 to 25%. The  left ventricle has severely decreased function. There is global  hypokinesis with akinesis of the mid-to-apical septal, mid-to-apical  inferior, all apical segments and apex. The left   ventricular internal cavity size was moderately dilated. Left ventricular  diastolic parameters are consistent with Grade II diastolic dysfunction  (  pseudonormalization). Elevated left atrial pressure.   2. Right ventricular systolic function is mildly reduced. The right  ventricular size is normal. Tricuspid regurgitation signal is inadequate  for assessing PA pressure.   3. Left atrial size was severely dilated.   4. The mitral valve is abnormal. Mild mitral valve regurgitation.   5. The aortic valve is tricuspid. There is mild calcification of the  aortic valve. There is mild thickening of the aortic valve. Aortic valve  regurgitation is mild to moderate.   6. The inferior vena cava is normal in size with greater than 50%  respiratory variability, suggesting right atrial pressure of 3 mmHg.   Patient Profile     [name] is a 16F HFrEF, COPD, CAD s/p CABG, and PAF admitted nausea, vomiting and abdominal pain. Cardiology consulted for elevated troponin.   Assessment & Plan    # CAD s/p CABG: # Elevated troponin:  She has no ischemic symptoms.  Likely demand ischemia.  Repeat echo pending.  At this time, no plan for repeat ischemia evaluation.   # HFrEF:  # AKI:  LVEF 20-25%.  She has not eaten in 4 days.  Creatinine is up to 1.6 from 0.92.  Will given 250 mL NS and encourage her to drink. Respiratory status is stable.  Continue digoxin, spironolactone, ivabradine and Farxiga.    # PAF: Currently  insinus rhythm.  She is unable to keep medicine down, so continue heparin.  Transition back to Eliquis when able.   # Abdominal pain: # N/V:  Per primary team.  Work up thus far without a clear cause for her symptoms.  Lactate wnl.        For questions or updates, please contact [facility] Please consult [email or web address] for contact info under        Signed, Chilton Si, MD  04/20/2023, 8:31 AM

## 2023-04-20 NOTE — TOC Initial Note (Signed)
Transition of Care [facility]) - Initial/Assessment Note    Patient Details  Name: Brittany [name] MRN: [id number] Date of Birth: 24-Jul-1961  Transition of Care [facility]) CM/SW Contact:    Lockie Pares, RN Phone Number: 04/20/2023, 3:38 PM  Clinical Narrative:                 Patient presented with chest pain NSTEMI EF 20-25% is a patient at HF clinic. Has AICD. Has Paramedic services at home, underutilized by patient according to notes. She is currently on heparin. Does not have PCP listed. Will recommend on AVS,  TOC continue to follow for needs, recommendations and transitions of care  Expected Discharge Plan: Home/Self Care Barriers to Discharge: Continued Medical Work up   Patient Goals and CMS Choice            Expected Discharge Plan and Services   Discharge Planning Services: CM Consult   Living arrangements for the past 2 months: Apartment                                      Prior Living Arrangements/Services Living arrangements for the past 2 months: Apartment   Patient language and need for interpreter reviewed:: Yes        Need for Family Participation in Patient Care: Yes (Comment) Care giver support system in place?: Yes (comment)   Criminal Activity/Legal Involvement Pertinent to Current Situation/Hospitalization: No - Comment as needed  Activities of Daily Living Home Assistive Devices/Equipment: Walker (specify type) ADL Screening (condition at time of admission) Patient's cognitive ability adequate to safely complete daily activities?: Yes Is the patient deaf or have difficulty hearing?: No Does the patient have difficulty seeing, even when wearing glasses/contacts?: No Does the patient have difficulty concentrating, remembering, or making decisions?: No Patient able to express need for assistance with ADLs?: Yes Does the patient have difficulty dressing or bathing?: No Independently performs ADLs?: Yes (appropriate for developmental  age) Does the patient have difficulty walking or climbing stairs?: Yes Weakness of Legs: Right Weakness of Arms/Hands: Both  Permission Sought/Granted                  Emotional Assessment       Orientation: : Oriented to Place, Oriented to Situation, Oriented to Self   Psych Involvement: No (comment)  Admission diagnosis:  Pyelonephritis [N12] Left flank pain [R10.9] NSTEMI (non-ST elevated myocardial infarction) (HCC) [I21.4] Troponin level elevated [R79.89] Patient Active Problem List   Diagnosis Date Noted   Trichomonas infection 04/19/2023   Presence of heart assist device (HCC) 04/11/2023   COPD with acute exacerbation (HCC) 03/01/2023   CAD (coronary artery disease) 03/01/2023   Acute respiratory failure with hypoxia (HCC) 11/14/2022   Sepsis (HCC) 11/14/2022   Influenza A with pneumonia 11/14/2022   Troponin level elevated 11/14/2022   History of lung cancer 11/14/2022   Acute combined systolic and diastolic CHF, NYHA class 4 (HCC) 07/09/2022   Malnutrition of moderate degree 06/20/2022   Lactic acidosis 04/12/2022   Prolonged QT interval 04/09/2022   Hypotension 04/09/2022   Chronic HFrEF (heart failure with reduced ejection fraction) (HCC) 12/11/2021   CHF exacerbation (HCC) 12/10/2021   Renal infarct (HCC) 12/10/2021   COPD exacerbation (HCC) 09/29/2021   Mixed diabetic hyperlipidemia associated with type 2 diabetes mellitus (HCC) 09/29/2021   CKD (chronic kidney disease) stage 3, GFR 30-59 ml/min (HCC) 09/29/2021   Unstable angina (  HCC) 02/09/2021   Hemoptysis 10/20/2020   Type 2 diabetes mellitus with stage 3a chronic kidney disease, without long-term current use of insulin (HCC) 10/20/2020   Acute on chronic combined systolic and diastolic CHF (congestive heart failure) (HCC) 05/21/2020   Ischemic cardiomyopathy 04/12/2020   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   NSTEMI (non-ST elevated myocardial infarction) (HCC)  02/12/2019   CHF (congestive heart failure) (HCC) 11/24/2018   Recurrent pleural effusion on right 05/05/2018   Acute on chronic respiratory failure with hypoxia (HCC) 05/05/2018   Chronic respiratory failure with hypoxia (HCC)    Asthma    Anxiety    Allergy    HCAP (healthcare-associated pneumonia) 03/15/2015   Chest pain 03/15/2015   Small cell lung cancer (HCC) 03/16/2014   Protein-calorie malnutrition, severe (HCC) 03/14/2014   AF (paroxysmal atrial fibrillation) (HCC) 03/12/2014   Essential hypertension 05/07/2012   PCP:  Pcp, No Pharmacy:   [id number] [place], [place] - [address] [place] [address] Phone: [phone] Fax: [phone]     Social Determinants of Health (SDOH) Social History: SDOH Screenings   Food Insecurity: No Food Insecurity (04/19/2023)  Housing: Low Risk  (04/19/2023)  Transportation Needs: No Transportation Needs (04/19/2023)  Utilities: Not At Risk (04/19/2023)  Alcohol Screen: Low Risk  (02/11/2021)  Depression (PHQ2-9): Low Risk  (02/11/2021)  Financial Resource Strain: Medium Risk (02/11/2021)  Physical Activity: Insufficiently Active (02/11/2021)  Social Connections: Unknown (02/11/2021)  Stress: Stress Concern Present (02/11/2021)  Tobacco Use: Medium Risk (04/19/2023)   SDOH Interventions:     Readmission Risk Interventions    06/20/2022   10:00 AM 04/11/2021   10:16 AM  Readmission Risk Prevention Plan  Transportation Screening Complete Complete  PCP or Specialist Appt within 3-5 Days Complete Complete  HRI or Home Care Consult Complete Complete  Social Work Consult for Recovery Care Planning/Counseling Complete Complete  Palliative Care Screening Not Applicable Not Applicable  Medication Review Oceanographer) Complete Complete

## 2023-04-20 NOTE — Progress Notes (Signed)
ANTICOAGULATION CONSULT NOTE- follow-up  Pharmacy Consult for Heparin (Apixaban on hold) Indication: chest pain/ACS and atrial fibrillation  Allergies  Allergen Reactions   Codeine Nausea And Vomiting    Vital Signs: Temp: 99.8 F (37.7 C) (05/10 0418) Temp Source: Oral (05/10 0418) BP: 100/55 (05/10 0738) Pulse Rate: 93 (05/10 0738)  Labs: Recent Labs    04/18/23 2240 04/19/23 0107 04/19/23 0631 04/19/23 0830 04/19/23 1455 04/20/23 0057 04/20/23 0924  HGB 15.6*  --   --   --   --  14.6 13.8  HCT 49.3*  --   --   --   --  44.7 44.2  PLT 259  --   --   --   --  182 178  APTT  --   --   --   --  40* 70*  --   HEPARINUNFRC  --   --   --   --  0.13* 0.33 0.23*  CREATININE 1.46*  --  1.63*  --   --  1.63*  --   TROPONINIHS 325* 413* 504* 452*  --   --   --      Estimated Creatinine Clearance: 24.4 mL/min (A) (by C-G formula based on SCr of 1.63 mg/dL (H)).  Assessment: 62 y/o F with abdominal and flank pain. Found to have elevated troponin. On apxiaban PTA for afib. Starting heparin and holding apixaban. Last apixaban dose per med rec 5/7 0800, ok to start heparin now. Anticipate using aPTT to dose for now. CBC good.  baseline heparin level and PTT low ,  1s HL post start to hepairn was therapeutic.  HL correlating with PTT, thus discontinued the daily PTTs.  Will use HL only.   Next Heparin level down to 0.23 on heparin rate 650 units/hr.   CBC remains within normal.  PLTC down to 178, within normal.  No issues with heparin infusion and no bleeding reported per discussion with RN.   Goal of Therapy:  Heparin level 0.3-0.7 units/ml Monitor platelets by anticoagulation protocol: Yes   Plan:  Increase heparin to 750 units / hr Check 6 hour heparin level Daily CBC, heparin level Monitor for bleeding   Thank you for allowing pharmacy to be part of this patients care team.  Noah Delaine, RPh Clinical Pharmacist 04/20/2023 12:32 PM  Please check AMION for all [facility]  Pharmacy phone numbers After 10:00 PM, call Main Pharmacy [phone]

## 2023-04-20 NOTE — Progress Notes (Signed)
ANTICOAGULATION CONSULT NOTE- follow-up  Pharmacy Consult for Heparin (Apixaban on hold) Indication: chest pain/ACS and atrial fibrillation  Allergies  Allergen Reactions   Codeine Nausea And Vomiting    Vital Signs: Temp: 100 F (37.8 C) (05/10 0035) Temp Source: Oral (05/10 0035) BP: 96/59 (05/10 0035) Pulse Rate: 97 (05/10 0035)  Labs: Recent Labs    04/18/23 2240 04/19/23 0107 04/19/23 0631 04/19/23 0830 04/19/23 1455 04/20/23 0057  HGB 15.6*  --   --   --   --  14.6  HCT 49.3*  --   --   --   --  44.7  PLT 259  --   --   --   --  182  APTT  --   --   --   --  40* 70*  HEPARINUNFRC  --   --   --   --  0.13* 0.33  CREATININE 1.46*  --  1.63*  --   --  1.63*  TROPONINIHS 325* 413* 504* 452*  --   --      Estimated Creatinine Clearance: 24.4 mL/min (A) (by C-G formula based on SCr of 1.63 mg/dL (H)).  Assessment: 62 y/o F with abdominal and flank pain. Found to have elevated troponin. On apxiaban PTA for afib. Starting heparin and holding apixaban. Last apixaban dose per med rec 5/7 0800, ok to start heparin now. Anticipate using aPTT to dose for now. CBC good.   Initial heparin level and PTT low  5/10 AM update: HL 0.33- therapeutic aPTT 70 seconds- therapeutic No signs of bleeding or issues with heparin infusion HL and aPTT correlate   Goal of Therapy:  Heparin level 0.3-0.7 units/ml Monitor platelets by anticoagulation protocol: Yes   Plan:  Continue heparin to 650 units / hr Daily CBC, heparin level Monitor for bleeding    Greta Doom BS, PharmD, BCPS Clinical Pharmacist 04/20/2023 2:06 AM  Contact: [phone] after 3 PM  "Be curious, not judgmental..." -Debbora Dus

## 2023-04-20 NOTE — Progress Notes (Signed)
Initial Nutrition Assessment  DOCUMENTATION CODES:   Non-severe (moderate) malnutrition in context of chronic illness  INTERVENTION:  Liberalize diet from Heart Healthy to Regular Increase Ensure Enlive po from BID to TID, each supplement provides 350 kcal and 20 grams of protein. Snacks TID between meals  NUTRITION DIAGNOSIS:   Moderate Malnutrition related to chronic illness (CHF, afib, CAD) as evidenced by moderate fat depletion, severe muscle depletion, mild muscle depletion.  GOAL:   Patient will meet greater than or equal to 90% of their needs   MONITOR:   PO intake, Supplement acceptance, Labs, Weight trends, Diet advancement  REASON FOR ASSESSMENT:   Consult Other (Comment) (nutrition goals)  ASSESSMENT:   Pt admitted with n/v and abdominal pain. PMH significant for chronic systolic CHF s/p AICD, DM, HTN, afib, pacemaker placement and CAD s/p CABG.   Unclear cause for abdominal pain and n/v. Elevated troponins on admission likely d/t demand ischemia. Cardiology continues with medical management.  Spoke with pt at bedside. She reports d/t n/v/abdominal pain that she has only been able to consume water and 1 ensure daily at home since Tuesday. Prior to that she recalls eating at her baseline which she feels as though was good PO intake.   Today she only consumed a couple bites of fresh fruit cup from lunch tray. She has not consumed an Ensure today. Endorses ongoing nausea but no emesis today. She states the thought of eating continues to make her sick. She believes that she could drink an Ensure easier than she could eat.   Meal completions: 0% breakfast  Given ongoing inadequate PO intake, pt would benefit from a liberalized diet at this time to limit restrictions and encourage increased intake. Can consider diet adjustments once PO intake begins to improve.   Pt states that she has encountered about a 15-20 lbs weight loss within the last year. When asked if her  portion sizes have decreased or what she thinks has contributed to weight loss she states "I'm not sure."  Reviewed weight history on file. Pt's weights have remained variable between ~43-46 kg within the last year. Unclear of any true trends however it appears that her weight has declined overall. Current weight noted to be 46.1 kg. Will continue to monitor throughout admission.   Discussed adding a MVI however pt prefers to hold off at this time as she is "taking plenty" of other medications daily.   Medications:  klor-con, IV abx  Labs: sodium 132, BUN 27, Cr 1.63, GFR 35  NUTRITION - FOCUSED PHYSICAL EXAM:  Flowsheet Row Most Recent Value  Orbital Region No depletion  Upper Arm Region Moderate depletion  Thoracic and Lumbar Region Mild depletion  Buccal Region No depletion  Temple Region No depletion  Clavicle Bone Region Mild depletion  Clavicle and Acromion Bone Region Moderate depletion  Scapular Bone Region Mild depletion  Dorsal Hand No depletion  Patellar Region Severe depletion  Anterior Thigh Region Severe depletion  Posterior Calf Region Severe depletion  Edema (RD Assessment) None  Hair Reviewed  Eyes Reviewed  Mouth Reviewed  Skin Reviewed  Nails Reviewed       Diet Order:   Diet Order             Diet regular Room service appropriate? Yes; Fluid consistency: Thin  Diet effective now                   EDUCATION NEEDS:   Education needs have been addressed  Skin:  Skin Assessment: Reviewed RN Assessment  Last BM:  5/7  Height:   Ht Readings from Last 1 Encounters:  04/19/23 4\' 11"  (1.499 m)    Weight:   Wt Readings from Last 1 Encounters:  04/20/23 46.1 kg   BMI:  Body mass index is 20.53 kg/m.  Estimated Nutritional Needs:   Kcal:  1400-1600  Protein:  65-75g  Fluid:  >/=1.5L  Drusilla Kanner, RDN, LDN Clinical Nutrition

## 2023-04-20 NOTE — Consult Note (Signed)
   [facility] Inpatient Consult   04/20/2023  Brittany [name] [birth year] [id number]  [facility] [[facility]]  Accountable Care Organization [ACO] Patient: [name] PPO  Primary Care Provider:  Pcp, No   Patient screened for 3 hospitalization in the past 6 months with noted high risk score for unplanned readmission risk to assess for potential [facility]  [[facility]] Care Management service needs for post hospital transition for care coordination.  Review of patient's electronic medical record reveals patient is active with HF paramedicine program as discussed in unit progression meeting.  Came by to speak with patient regarding PCP patient was sound asleep and meds has been given.  Did not awaken  Plan:  Continue to follow progress and disposition to assess for post hospital community care coordination/management needs.  Referral request for community care coordination: following  Of note, [facility] Care Management/Population Health does not replace or interfere with any arrangements made by the Inpatient Transition of Care team.  For questions contact:   Charlesetta Shanks, RN BSN CCM [facility]  [phone] business mobile phone Toll free office [phone]  *Concierge Line  [phone] Fax number: [phone] [email or web address]

## 2023-04-20 NOTE — Progress Notes (Signed)
Heart Failure Navigator Progress Note  Assessed for Heart & Vascular TOC clinic readiness.  Patient does not meet criteria due to Advanced Heart Failure Team patient of Dr. Bensimhon.   Navigator will sign off at this time.   Macel Yearsley, BSN, RN Heart Failure Nurse Navigator Secure Chat Only   

## 2023-04-20 NOTE — Progress Notes (Signed)
PROGRESS NOTE    Brittany [name]  ZOX:[id number] DOB: June 27, 1961 DOA: 04/18/2023 PCP: Pcp, No   Brief Narrative:  HPI: Brittany [name] is a 62 y.o. female with medical history significant of chronic systolic CHF with AICD,  DM, HTN, afib, pacemaker placement, and CAD s/p CABG presenting with flank pain.     ER Course:  L flank pain with n/v.  Troponin  increased from baseline.  CT ok.  UA/wet prep with trichomonas -? PID form trichomonas.  Cardiology is consulting, recommend Echo.  Dig level 1.2.    Assessment & Plan:   NSTEMI: -CXR unremarkable.   -EKG with no apparent STEMI. -She was started on heparin infusion.  Continue aspirin, statin, ivabradine, nitro as needed -Cardiology consulted-likely demand ischemia.  Repeat echo is pending. -No plans for repeat ischemia evaluation at this time.  Appreciated cardiology recommendations  AKI on stage 3a CKD -Mildly worse than baseline.  Likely due to poor p.o. intake -300cc IV bolus given.  Encouraged to drink plenty of water. -Avoid nephrotoxic medication.  Repeat BMP tomorrow a.m.   HTN -Hold diuretics for now.  Continue to monitor blood pressure closely   HLD -Continue statin   Type 2 diabetes -A1c 7.3.  Will start sliding scale insulin.   Trichomonas infection -Unlikely to be the cause of her symptoms -She strictly denies sexual relations for years -Continue IV Flagyl for now   Chronic systolic CHF -Has AICD, pacer -Echo is pending -Hold Farxiga, Ivabradine -She is enrolled in a research protocol -Continue digoxin, spironolactone, ivabradine and Farxiga   Afib -Rate controlled with Digoxin -Eliquis is on hold while patient is on heparin   COPD -Continue Trelegy, prn Albuterol   Prolonged QTc -Likely associated with dehydration, anticipate resolution once volume status is normalized -Will attempt to avoid QT-prolonging medications such as PPI, nausea meds, SSRIs  Leukocytosis: Likely reactive.   Patient is afebrile.  Will continue to monitor.  DVT prophylaxis: Eliquis Code Status: Full code Family Communication:  None present at bedside.  Plan of care discussed with patient in length and she verbalized understanding and agreed with it. Disposition Plan: To be determined  Consultants:  Cardiology  Procedures:  None  Antimicrobials:  Flagyl  Status is: Inpatient   Subjective: Patient seen and examined.  Sitting comfortably on the bed.  Continues to have left quadrant abdominal pain and persistent nausea.  Unable to eat breakfast.  Remained afebrile.  No chest pain, shortness of breath.  No acute events overnight.  Objective: Vitals:   04/20/23 0418 04/20/23 0557 04/20/23 0738 04/20/23 0906  BP: 94/61  (!) 100/55   Pulse: 97  93   Resp: 18  18   Temp: 99.8 F (37.7 C)     TempSrc: Oral     SpO2:   95% 95%  Weight:  46.1 kg    Height:        Intake/Output Summary (Last 24 hours) at 04/20/2023 1130 Last data filed at 04/20/2023 0849 Gross per 24 hour  Intake 480 ml  Output 250 ml  Net 230 ml   Filed Weights   04/19/23 0737 04/20/23 0557  Weight: 43.5 kg 46.1 kg    Examination:  General exam: Appears calm and comfortable, on room air, communicating well, nauseated Respiratory system: Clear to auscultation. Respiratory effort normal. Cardiovascular system: S1 & S2 heard, RRR. No JVD, murmurs, rubs, gallops or clicks. No pedal edema. Gastrointestinal system: Left sided abdominal tenderness positive, no guarding, no rigidity, no hepatosplenomegaly, bowel sounds positive  Central nervous system: Alert and oriented. No focal neurological deficits. Extremities: Symmetric 5 x 5 power. Skin: No rashes, lesions or ulcers Psychiatry: Judgement and insight appear normal. Mood & affect appropriate.    Data Reviewed: I have personally reviewed following labs and imaging studies  CBC: Recent Labs  Lab 04/18/23 2240 04/20/23 0057 04/20/23 0924  WBC 12.5* 17.2*  17.4*  NEUTROABS 10.3*  --   --   HGB 15.6* 14.6 13.8  HCT 49.3* 44.7 44.2  MCV 89.8 88.5 90.4  PLT 259 182 178   Basic Metabolic Panel: Recent Labs  Lab 04/18/23 2240 04/19/23 0631 04/20/23 0057  NA 135 133* 132*  K 5.3* 4.3 4.5  CL 101 99 100  CO2 23 23 22   GLUCOSE 117* 167* 130*  BUN 27* 28* 27*  CREATININE 1.46* 1.63* 1.63*  CALCIUM 9.2 9.2 9.0  MG 2.3  --   --    GFR: Estimated Creatinine Clearance: 24.4 mL/min (A) (by C-G formula based on SCr of 1.63 mg/dL (H)). Liver Function Tests: Recent Labs  Lab 04/18/23 2240  AST 73*  ALT 28  ALKPHOS 76  BILITOT 0.6  PROT 7.1  ALBUMIN 3.8   Recent Labs  Lab 04/18/23 2240  LIPASE 30   No results for input(s): "AMMONIA" in the last 168 hours. Coagulation Profile: No results for input(s): "INR", "PROTIME" in the last 168 hours. Cardiac Enzymes: No results for input(s): "CKTOTAL", "CKMB", "CKMBINDEX", "TROPONINI" in the last 168 hours. BNP (last 3 results) No results for input(s): "PROBNP" in the last 8760 hours. HbA1C: Recent Labs    04/19/23 1824  HGBA1C 7.3*   CBG: No results for input(s): "GLUCAP" in the last 168 hours. Lipid Profile: Recent Labs    04/19/23 1824  CHOL 293*  HDL 53  LDLCALC 224*  TRIG 78  CHOLHDL 5.5   Thyroid Function Tests: Recent Labs    04/19/23 1824  TSH 0.640   Anemia Panel: No results for input(s): "VITAMINB12", "FOLATE", "FERRITIN", "TIBC", "IRON", "RETICCTPCT" in the last 72 hours. Sepsis Labs: Recent Labs  Lab 04/19/23 1455 04/19/23 1741  LATICACIDVEN 1.2 1.4    Recent Results (from the past 240 hour(s))  Wet prep, genital     Status: Abnormal   Collection Time: 04/19/23  3:00 AM  Result Value Ref Range Status   Yeast Wet Prep HPF POC NONE SEEN NONE SEEN Final   Trich, Wet Prep PRESENT (A) NONE SEEN Final   Clue Cells Wet Prep HPF POC NONE SEEN NONE SEEN Final   WBC, Wet Prep HPF POC >=10 (A) <10 Final   Sperm NONE SEEN  Final    Comment: Performed at  [facility], [address], [place], [place] [address]      Radiology Studies: CT RENAL STONE STUDY  Result Date: 04/18/2023 CLINICAL DATA:  Left-sided flank pain for several hours, initial encounter EXAM: CT ABDOMEN AND PELVIS WITHOUT CONTRAST TECHNIQUE: Multidetector CT imaging of the abdomen and pelvis was performed following the standard protocol without IV contrast. RADIATION DOSE REDUCTION: This exam was performed according to the departmental dose-optimization program which includes automated exposure control, adjustment of the mA and/or kV according to patient size and/or use of iterative reconstruction technique. COMPARISON:  12/10/2021 FINDINGS: Lower chest: Mild scarring is noted in the bases bilaterally stable from the prior exam. Hepatobiliary: No focal liver abnormality is seen. No gallstones, gallbladder wall thickening, or biliary dilatation. Pancreas: Unremarkable. No pancreatic ductal dilatation or surrounding inflammatory changes. Spleen: Normal  in size without focal abnormality. Adrenals/Urinary Tract: Adrenal glands are within normal limits. No renal calculi or obstructive changes are noted. No focal mass is seen. The bladder is decompressed. Stomach/Bowel: No obstructive or inflammatory changes of the colon are seen. The appendix is within normal limits. Small bowel and stomach are within normal limits. Vascular/Lymphatic: Aortic atherosclerosis. No enlarged abdominal or pelvic lymph nodes. Reproductive: Status post hysterectomy. No adnexal masses. Other: No abdominal wall hernia or abnormality. No abdominopelvic ascites. Musculoskeletal: No acute or significant osseous findings. IMPRESSION: No acute abnormality is noted. Electronically Signed   By: Alcide Clever M.D.   On: 04/18/2023 23:59   DG Chest Portable 1 View  Result Date: 04/18/2023 CLINICAL DATA:  Shortness of breath. EXAM: PORTABLE CHEST 1 VIEW COMPARISON:  Chest radiograph dated 03/03/2023. FINDINGS: No focal  consolidation, pleural effusion, pneumothorax. The lungs are hyperinflated with flattening of the diaphragms. Mild cardiomegaly. Median sternotomy wires, CABG vascular clips, and left pectoral AICD device. Stimulator device over the right chest. No acute osseous pathology. IMPRESSION: 1. No active disease. 2. Mild cardiomegaly. Electronically Signed   By: Elgie Collard M.D.   On: 04/18/2023 22:54    Scheduled Meds:  aspirin EC  325 mg Oral Daily   atorvastatin  80 mg Oral Daily   digoxin  0.125 mg Oral Daily   feeding supplement  2 Bottle Oral Daily   fluticasone furoate-vilanterol  1 puff Inhalation Daily   And   umeclidinium bromide  1 puff Inhalation Daily   ivabradine  7.5 mg Oral BID WC   potassium chloride  30 mEq Oral BID   sodium chloride flush  3 mL Intravenous Q12H   Continuous Infusions:  heparin 650 Units/hr (04/19/23 1559)   metronidazole 500 mg (04/20/23 0307)     LOS: 1 day   Time spent: 35 minutes   Sayra Frisby Estill Cotta, MD [facility]  If 7PM-7AM, please contact night-coverage [email or web address] 04/20/2023, 11:30 AM

## 2023-04-20 NOTE — Progress Notes (Signed)
  Echocardiogram 2D Echocardiogram has been performed.  Brittany [name] 04/20/2023, 11:52 AM

## 2023-04-21 DIAGNOSIS — I2510 Atherosclerotic heart disease of native coronary artery without angina pectoris: Secondary | ICD-10-CM

## 2023-04-21 DIAGNOSIS — R7989 Other specified abnormal findings of blood chemistry: Secondary | ICD-10-CM | POA: Diagnosis not present

## 2023-04-21 LAB — BASIC METABOLIC PANEL
Anion gap: 7 (ref 5–15)
BUN: 26 mg/dL — ABNORMAL HIGH (ref 8–23)
CO2: 22 mmol/L (ref 22–32)
Calcium: 8.9 mg/dL (ref 8.9–10.3)
Chloride: 104 mmol/L (ref 98–111)
Creatinine, Ser: 1.51 mg/dL — ABNORMAL HIGH (ref 0.44–1.00)
GFR, Estimated: 39 mL/min — ABNORMAL LOW (ref >60)
Glucose, Bld: 117 mg/dL — ABNORMAL HIGH (ref 70–99)
Potassium: 4.9 mmol/L (ref 3.5–5.1)
Sodium: 133 mmol/L — ABNORMAL LOW (ref 135–145)

## 2023-04-21 LAB — CBC
HCT: 40.7 % (ref 36.0–46.0)
Hemoglobin: 12.6 g/dL (ref 12.0–15.0)
MCH: 28.5 pg (ref 26.0–34.0)
MCHC: 31 g/dL (ref 30.0–36.0)
MCV: 92.1 fL (ref 80.0–100.0)
Platelets: 149 10*3/uL — ABNORMAL LOW (ref 150–400)
RBC: 4.42 MIL/uL (ref 3.87–5.11)
RDW: 19.8 % — ABNORMAL HIGH (ref 11.5–15.5)
WBC: 13.5 10*3/uL — ABNORMAL HIGH (ref 4.0–10.5)
nRBC: 0 % (ref 0.0–0.2)

## 2023-04-21 LAB — GLUCOSE, CAPILLARY
Glucose-Capillary: 102 mg/dL — ABNORMAL HIGH (ref 70–99)
Glucose-Capillary: 135 mg/dL — ABNORMAL HIGH (ref 70–99)
Glucose-Capillary: 96 mg/dL (ref 70–99)
Glucose-Capillary: 156 mg/dL — ABNORMAL HIGH (ref 70–99)

## 2023-04-21 LAB — HEPARIN LEVEL (UNFRACTIONATED): Heparin Unfractionated: 0.27 IU/mL — ABNORMAL LOW (ref 0.30–0.70)

## 2023-04-21 LAB — MAGNESIUM: Magnesium: 2.2 mg/dL (ref 1.7–2.4)

## 2023-04-21 MED ORDER — ASPIRIN 81 MG PO TBEC
81.0000 mg | DELAYED_RELEASE_TABLET | Freq: Every day | ORAL | Status: DC
  Administered 2023-04-21 – 2023-04-22 (×2): 81 mg via ORAL
  Filled 2023-04-21 (×2): qty 1

## 2023-04-21 MED ORDER — LEVALBUTEROL HCL 0.63 MG/3ML IN NEBU
0.6300 mg | INHALATION_SOLUTION | Freq: Four times a day (QID) | RESPIRATORY_TRACT | Status: DC | PRN
  Administered 2023-04-21: 0.63 mg via RESPIRATORY_TRACT
  Filled 2023-04-21: qty 3

## 2023-04-21 MED ORDER — ALBUTEROL SULFATE (2.5 MG/3ML) 0.083% IN NEBU: 3.0000 mL | INHALATION_SOLUTION | Freq: Four times a day (QID) | RESPIRATORY_TRACT | Status: DC | PRN

## 2023-04-21 MED ORDER — PROCHLORPERAZINE MALEATE 10 MG PO TABS: 10.0000 mg | ORAL_TABLET | Freq: Four times a day (QID) | ORAL | Status: DC | PRN

## 2023-04-21 MED ORDER — APIXABAN 2.5 MG PO TABS
2.5000 mg | ORAL_TABLET | Freq: Two times a day (BID) | ORAL | Status: DC
  Administered 2023-04-21 – 2023-04-22 (×3): 2.5 mg via ORAL
  Filled 2023-04-21 (×3): qty 1

## 2023-04-21 NOTE — Progress Notes (Signed)
Patient complained of SOB, O2 increased to 3LPM. Assessment done. Wheezing noted on the upper lungs. MD notified. See new orders.

## 2023-04-21 NOTE — Progress Notes (Signed)
ANTICOAGULATION CONSULT NOTE- follow-up  Pharmacy Consult for Heparin (Apixaban on hold) Indication: chest pain/ACS and atrial fibrillation  Allergies  Allergen Reactions   Lactose Intolerance (Gi)     Per patient   Codeine Nausea And Vomiting    Vital Signs: Temp: 97.9 F (36.6 C) (05/11 0754) Temp Source: Oral (05/11 0558) BP: 106/55 (05/11 0754) Pulse Rate: 84 (05/11 0754) Heparin dosing weight: 43.5 kg (TBW)   Labs: Recent Labs    04/18/23 2240 04/19/23 0107 04/19/23 0631 04/19/23 0830 04/19/23 1455 04/20/23 0057 04/20/23 0924 04/20/23 1909 04/21/23 0657  HGB  --   --   --   --   --  14.6 13.8  --  12.6  HCT  --   --   --   --   --  44.7 44.2  --  40.7  PLT  --   --   --   --   --  182 178  --  149*  APTT  --   --   --   --  40* 70*  --   --   --   HEPARINUNFRC   < >  --   --   --  0.13* 0.33 0.23* 0.19* 0.27*  CREATININE  --   --  1.63*  --   --  1.63*  --   --  1.51*  TROPONINIHS  --  413* 504* 452*  --   --   --   --   --    < > = values in this interval not displayed.    Estimated Creatinine Clearance: 26.3 mL/min (A) (by C-G formula based on SCr of 1.51 mg/dL (H)).  Assessment: 62 y/o F with abdominal and flank pain. Found to have elevated troponin. On apxiaban PTA for afib. Starting heparin and holding apixaban. Last apixaban dose 5/7 AM per med rec. Heparin levels correlating with aPTT, and aPTT now d/c'd.  Heparin level 0.27 slightly subtherapeutic after increase to 900 units/hr. Hgb 12.6, PLT 149. No issues with heparin infusion or s/sx bleeding per RN.   Goal of Therapy:  Heparin level 0.3-0.7 units/ml Monitor platelets by anticoagulation protocol: Yes   Plan:  Increase heparin to 950 units/hr Check 8 hour heparin level Monitor daily CBC, s/sx bleeding  Larena Sox, PharmD PGY1 Pharmacy Resident   04/21/2023  8:49 AM

## 2023-04-21 NOTE — Progress Notes (Signed)
Rounding Note    Patient Name: Brittany [name] Date of Encounter: 04/21/2023  [facility] Cardiologist: None   Subjective   States she is taking medications. Still has nausea  Inpatient Medications    Scheduled Meds:  aspirin EC  81 mg Oral Daily   atorvastatin  80 mg Oral Daily   digoxin  0.125 mg Oral Daily   feeding supplement  237 mL Oral TID BM   fluticasone furoate-vilanterol  1 puff Inhalation Daily   And   umeclidinium bromide  1 puff Inhalation Daily   insulin aspart  0-15 Units Subcutaneous TID WC   insulin aspart  0-5 Units Subcutaneous QHS   ivabradine  7.5 mg Oral BID WC   potassium chloride  30 mEq Oral BID   sodium chloride flush  3 mL Intravenous Q12H   Continuous Infusions:  heparin 950 Units/hr (04/21/23 1012)   metronidazole 500 mg (04/21/23 0314)   PRN Meds: acetaminophen **OR** acetaminophen, hydrALAZINE, HYDROmorphone (DILAUDID) injection, nitroGLYCERIN, oxyCODONE, prochlorperazine, traZODone   Vital Signs    Vitals:   04/21/23 0042 04/21/23 0310 04/21/23 0558 04/21/23 0754  BP: (!) 99/51  (!) 96/45 (!) 106/55  Pulse: 82   84  Resp: 18  18 18   Temp: 99.8 F (37.7 C)  99.8 F (37.7 C) 97.9 F (36.6 C)  TempSrc: Oral  Oral   SpO2: 93%  100% 98%  Weight:  43.5 kg    Height:        Intake/Output Summary (Last 24 hours) at 04/21/2023 1135 Last data filed at 04/21/2023 0900 Gross per 24 hour  Intake 360 ml  Output 251 ml  Net 109 ml      04/21/2023    3:10 AM 04/20/2023    5:57 AM 04/19/2023    7:37 AM  Last 3 Weights  Weight (lbs) 95 lb 12.8 oz 101 lb 10.1 oz 95 lb 14.4 oz  Weight (kg) 43.455 kg 46.1 kg 43.5 kg      Telemetry    NSR - Personally Reviewed  ECG    NA - Personally Reviewed  Physical Exam   Vitals:   04/21/23 0558 04/21/23 0754  BP: (!) 96/45 (!) 106/55  Pulse:  84  Resp: 18 18  Temp: 99.8 F (37.7 C) 97.9 F (36.6 C)  SpO2: 100% 98%    GEN: mild distress.   Neck: No JVD Cardiac:  RRR, no murmurs, rubs, or gallops.  Respiratory: Clear to auscultation bilaterally. GI: Soft, nontender, non-distended  MS: No edema; No deformity. Neuro:  Nonfocal  Psych: Normal affect   Labs    High Sensitivity Troponin:   Recent Labs  Lab 04/18/23 2240 04/19/23 0107 04/19/23 0631 04/19/23 0830  TROPONINIHS 325* 413* 504* 452*     Chemistry Recent Labs  Lab 04/18/23 2240 04/19/23 0631 04/20/23 0057 04/21/23 0657  NA 135 133* 132* 133*  K 5.3* 4.3 4.5 4.9  CL 101 99 100 104  CO2 23 23 22 22   GLUCOSE 117* 167* 130* 117*  BUN 27* 28* 27* 26*  CREATININE 1.46* 1.63* 1.63* 1.51*  CALCIUM 9.2 9.2 9.0 8.9  MG 2.3  --   --  2.2  PROT 7.1  --   --   --   ALBUMIN 3.8  --   --   --   AST 73*  --   --   --   ALT 28  --   --   --   ALKPHOS 76  --   --   --  BILITOT 0.6  --   --   --   GFRNONAA 40* 35* 35* 39*  ANIONGAP 11 11 10 7     Lipids  Recent Labs  Lab 04/19/23 1824  CHOL 293*  TRIG 78  HDL 53  LDLCALC 224*  CHOLHDL 5.5    Hematology Recent Labs  Lab 04/20/23 0057 04/20/23 0924 04/21/23 0657  WBC 17.2* 17.4* 13.5*  RBC 5.05 4.89 4.42  HGB 14.6 13.8 12.6  HCT 44.7 44.2 40.7  MCV 88.5 90.4 92.1  MCH 28.9 28.2 28.5  MCHC 32.7 31.2 31.0  RDW 20.1* 20.2* 19.8*  PLT 182 178 149*   Thyroid  Recent Labs  Lab 04/19/23 1824  TSH 0.640    BNP Recent Labs  Lab 04/18/23 2240  BNP 1,203.6*    DDimer No results for input(s): "DDIMER" in the last 168 hours.   Radiology    ECHOCARDIOGRAM COMPLETE  Result Date: 04/20/2023    ECHOCARDIOGRAM REPORT   Patient Name:   LEVONIA KNOBLOCK Date of Exam: 04/20/2023 Medical Rec #:  [id number]               Height:       59.0 in Accession #:    [id number]              Weight:       101.6 lb Date of Birth:  27-Feb-1961               BSA:          1.383 m Patient Age:    61 years                BP:           110/55 mmHg Patient Gender: F                       HR:           83 bpm. Exam Location:  Inpatient  Procedure: 2D Echo, Intracardiac Opacification Agent, Cardiac Doppler and Color            Doppler Indications:    CAD  History:        Patient has prior history of Echocardiogram examinations, most                 recent 07/10/2022. Cardiomyopathy and CHF, CAD and Previous                 Myocardial Infarction, Prior Cardiac Surgery and Prior CABG;                 Risk Factors:Hypertension.  Sonographer:    Darlys Gales Referring Phys: [id number] JAN A MANSY IMPRESSIONS  1. Left ventricular ejection fraction, by estimation, is 20 to 25%. The left ventricle has severely decreased function. The left ventricle demonstrates global hypokinesis. The left ventricular internal cavity size was severely dilated. Left ventricular diastolic parameters are indeterminate.  2. Right ventricular systolic function is mildly reduced. The right ventricular size is normal. There is normal pulmonary artery systolic pressure. The estimated right ventricular systolic pressure is 19.8 mmHg.  3. Left atrial size was moderately dilated.  4. The mitral valve is normal in structure. Mild mitral valve regurgitation.  5. The aortic valve was not well visualized. Aortic valve regurgitation is moderate. Aortic valve sclerosis/calcification is present, without any evidence of aortic stenosis.  6. The inferior vena cava is normal in size with greater than 50% respiratory variability, suggesting  right atrial pressure of 3 mmHg. FINDINGS  Left Ventricle: Left ventricular ejection fraction, by estimation, is 20 to 25%. The left ventricle has severely decreased function. The left ventricle demonstrates global hypokinesis. Definity contrast agent was given IV to delineate the left ventricular endocardial borders. The left ventricular internal cavity size was severely dilated. There is no left ventricular hypertrophy. Left ventricular diastolic parameters are indeterminate. Right Ventricle: The right ventricular size is normal. No increase in right  ventricular wall thickness. Right ventricular systolic function is mildly reduced. There is normal pulmonary artery systolic pressure. The tricuspid regurgitant velocity is 2.05 m/s, and with an assumed right atrial pressure of 3 mmHg, the estimated right ventricular systolic pressure is 19.8 mmHg. Left Atrium: Left atrial size was moderately dilated. Right Atrium: Right atrial size was normal in size. Pericardium: There is no evidence of pericardial effusion. Mitral Valve: The mitral valve is normal in structure. Mild mitral valve regurgitation. Tricuspid Valve: The tricuspid valve is normal in structure. Tricuspid valve regurgitation is trivial. Aortic Valve: The aortic valve was not well visualized. Aortic valve regurgitation is moderate. Aortic regurgitation PHT measures 236 msec. Aortic valve sclerosis/calcification is present, without any evidence of aortic stenosis. Aortic valve mean gradient measures 7.0 mmHg. Aortic valve peak gradient measures 13.4 mmHg. Aortic valve area, by VTI measures 2.03 cm. Pulmonic Valve: The pulmonic valve was not well visualized. Pulmonic valve regurgitation is not visualized. Aorta: The aortic root and ascending aorta are structurally normal, with no evidence of dilitation. Venous: The inferior vena cava is normal in size with greater than 50% respiratory variability, suggesting right atrial pressure of 3 mmHg. IAS/Shunts: The interatrial septum was not well visualized.  LEFT VENTRICLE PLAX 2D LVIDd:         6.40 cm   Diastology LVIDs:         5.70 cm   LV e' medial:    5.66 cm/s LV PW:         0.70 cm   LV E/e' medial:  10.3 LV IVS:        0.50 cm   LV e' lateral:   10.60 cm/s LVOT diam:     1.80 cm   LV E/e' lateral: 5.5 LV SV:         62 LV SV Index:   45 LVOT Area:     2.54 cm  RIGHT VENTRICLE RV S prime:     6.64 cm/s TAPSE (M-mode): 1.6 cm LEFT ATRIUM             Index        RIGHT ATRIUM          Index LA Vol (A2C):   53.8 ml 38.89 ml/m  RA Area:     8.20 cm LA Vol  (A4C):   59.6 ml 43.09 ml/m  RA Volume:   12.80 ml 9.25 ml/m LA Biplane Vol: 58.1 ml 42.00 ml/m  AORTIC VALVE AV Area (Vmax):    1.85 cm AV Area (Vmean):   1.70 cm AV Area (VTI):     2.03 cm AV Vmax:           183.00 cm/s AV Vmean:          121.000 cm/s AV VTI:            0.306 m AV Peak Grad:      13.4 mmHg AV Mean Grad:      7.0 mmHg LVOT Vmax:         133.00  cm/s LVOT Vmean:        80.900 cm/s LVOT VTI:          0.244 m LVOT/AV VTI ratio: 0.80 AI PHT:            236 msec  AORTA Ao Root diam: 2.90 cm Ao Asc diam:  2.40 cm MITRAL VALVE               TRICUSPID VALVE MV Area (PHT): 4.96 cm    TR Peak grad:   16.8 mmHg MV Decel Time: 153 msec    TR Vmax:        205.00 cm/s MV E velocity: 58.20 cm/s MV A velocity: 42.60 cm/s  SHUNTS MV E/A ratio:  1.37        Systemic VTI:  0.24 m                            Systemic Diam: 1.80 cm Epifanio Lesches MD Electronically signed by Epifanio Lesches MD Signature Date/Time: 04/20/2023/12:38:43 PM    Final     Cardiac Studies   Echo 07/10/22:      1. Left ventricular ejection fraction, by estimation, is 20 to 25%. The  left ventricle has severely decreased function. There is global  hypokinesis with akinesis of the mid-to-apical septal, mid-to-apical  inferior, all apical segments and apex. The left   ventricular internal cavity size was moderately dilated. Left ventricular  diastolic parameters are consistent with Grade II diastolic dysfunction  (pseudonormalization). Elevated left atrial pressure.   2. Right ventricular systolic function is mildly reduced. The right  ventricular size is normal. Tricuspid regurgitation signal is inadequate  for assessing PA pressure.   3. Left atrial size was severely dilated.   4. The mitral valve is abnormal. Mild mitral valve regurgitation.   5. The aortic valve is tricuspid. There is mild calcification of the  aortic valve. There is mild thickening of the aortic valve. Aortic valve  regurgitation is mild to  moderate.   6. The inferior vena cava is normal in size with greater than 50%  respiratory variability, suggesting right atrial pressure of 3 mmHg.   Patient Profile     [name] is a 39F HFrEF EF 20-25%, COPD, CAD s/p CABG, and PAF admitted nausea, vomiting and abdominal pain. Cardiology consulted for elevated troponin.   Assessment & Plan    # CAD s/p CABG: # Elevated troponin:  She has no ischemic symptoms.  Likely demand ischemia. At this time, no plan for repeat ischemia evaluation.   # HFrEF:  # AKI:  LVEF 20-25%.  Echo unchanged. Not in cardiogenic shock.   Respiratory status is stable. She is euvolemic  Continue digoxin, spironolactone, ivabradine and Farxiga.    # PAF: Currently insinus rhythm.  Restarted home eliquis 2.5 mg BID   # Abdominal pain: # N/V:  Per primary team.  Work up thus far without a clear cause for her symptoms.  Lactate wnl.    She can go home with zofran if no cause identified. Cardiology will sign off, don't hesitate to reach out for further questions   For questions or updates, please contact [facility] Please consult [email or web address] for contact info under        Signed, Maisie Fus, MD  04/21/2023, 11:35 AM

## 2023-04-21 NOTE — Progress Notes (Signed)
PROGRESS NOTE    Brittany [name]  ZOX:[id number] DOB: 04-Oct-1961 DOA: 04/18/2023 PCP: Pcp, No   Brief Narrative:  HPI: Brittany [name] is a 62 y.o. female with medical history significant of chronic systolic CHF with AICD,  DM, HTN, afib, pacemaker placement, and CAD s/p CABG presenting with flank pain.     ER Course:  L flank pain with n/v.  Troponin  increased from baseline.  CT ok.  UA/wet prep with trichomonas -? PID form trichomonas.  Cardiology is consulting, recommend Echo.  Dig level 1.2.    Assessment & Plan:   Acute hypoxemic respiratory failure:  NSTEMI -CXR unremarkable.   -EKG with no apparent STEMI. -  Continue aspirin, statin, ivabradine, nitro as needed -Cardiology consulted-likely demand ischemia.  Repeat echo is unchanged. -No plans for repeat ischemia evaluation at this time.  Appreciated cardiology recommendations -Heparin gtt. switched to Eliquis  Chronic systolic CHF -Has AICD, pacer.  Appears euvolemic -Repeat echo shows ejection fraction of 20 to 25%.  Unchanged from prior echo.  Not in cardiogenic shock. -Continue digoxin, spironolactone, ivabradine and Farxiga  Left flank pain: Trichomoniasis:  -Unknown etiology.  Workup has been negative.  - UA positive for trichomonas.  She is strictly denies sexual relation for years. -On IV Flagyl.  AKI on stage 3a CKD -Kidney function slowly improving.  Continue to monitor kidney function closely.  Avoid nephrotoxic medication.   HTN -Hold diuretics for now.  Continue to monitor blood pressure closely   HLD -Continue statin   Type 2 diabetes -A1c 7.3.  On sliding scale insulin   Afib -Rate controlled with Digoxin -Eliquis is on hold while patient is on heparin   COPD -Continue Trelegy, prn Albuterol   Prolonged QTc -Likely associated with dehydration, anticipate resolution once volume status is normalized -Will attempt to avoid QT-prolonging medications such as PPI, nausea meds,  SSRIs  Leukocytosis: Improving.  Likely reactive.  Patient is afebrile.  Will continue to monitor.  Thrombocytopenia: No signs of active bleeding.  Continue to monitor  DVT prophylaxis: Eliquis Code Status: Full code Family Communication:  None present at bedside.  Plan of care discussed with patient in length and she verbalized understanding and agreed with it. Disposition Plan: To be determined  Consultants:  Cardiology  Procedures:  None  Antimicrobials:  Flagyl  Status is: Inpatient   Subjective: Patient seen and examined.  Continues to have nausea, poor appetite and left flank pain.  Remained afebrile.  No acute events overnight.  Denies chest pain or shortness of breath.  Objective: Vitals:   04/21/23 0310 04/21/23 0558 04/21/23 0754 04/21/23 1130  BP:  (!) 96/45 (!) 106/55 (!) 88/48  Pulse:   84 84  Resp:  18 18 18   Temp:  99.8 F (37.7 C) 97.9 F (36.6 C) 97.8 F (36.6 C)  TempSrc:  Oral    SpO2:  100% 98% 98%  Weight: 43.5 kg     Height:        Intake/Output Summary (Last 24 hours) at 04/21/2023 1451 Last data filed at 04/21/2023 1300 Gross per 24 hour  Intake 480 ml  Output 251 ml  Net 229 ml    Filed Weights   04/19/23 0737 04/20/23 0557 04/21/23 0310  Weight: 43.5 kg 46.1 kg 43.5 kg    Examination:  General exam: Appears calm and comfortable, nasal cannula  respiratory system: Clear to auscultation. Respiratory effort normal. Cardiovascular system: S1 & S2 heard, RRR. No JVD, murmurs, rubs, gallops or clicks. No pedal  edema. Gastrointestinal system: Left sided abdominal tenderness positive, no guarding, no rigidity, no hepatosplenomegaly, bowel sounds positive  Central nervous system: Alert and oriented. No focal neurological deficits. Extremities: Symmetric 5 x 5 power. Skin: No rashes, lesions or ulcers Psychiatry: Judgement and insight appear normal. Mood & affect appropriate.    Data Reviewed: I have personally reviewed following labs  and imaging studies  CBC: Recent Labs  Lab 04/18/23 2240 04/20/23 0057 04/20/23 0924 04/21/23 0657  WBC 12.5* 17.2* 17.4* 13.5*  NEUTROABS 10.3*  --   --   --   HGB 15.6* 14.6 13.8 12.6  HCT 49.3* 44.7 44.2 40.7  MCV 89.8 88.5 90.4 92.1  PLT 259 182 178 149*    Basic Metabolic Panel: Recent Labs  Lab 04/18/23 2240 04/19/23 0631 04/20/23 0057 04/21/23 0657  NA 135 133* 132* 133*  K 5.3* 4.3 4.5 4.9  CL 101 99 100 104  CO2 23 23 22 22   GLUCOSE 117* 167* 130* 117*  BUN 27* 28* 27* 26*  CREATININE 1.46* 1.63* 1.63* 1.51*  CALCIUM 9.2 9.2 9.0 8.9  MG 2.3  --   --  2.2    GFR: Estimated Creatinine Clearance: 26.3 mL/min (A) (by C-G formula based on SCr of 1.51 mg/dL (H)). Liver Function Tests: Recent Labs  Lab 04/18/23 2240  AST 73*  ALT 28  ALKPHOS 76  BILITOT 0.6  PROT 7.1  ALBUMIN 3.8    Recent Labs  Lab 04/18/23 2240  LIPASE 30    No results for input(s): "AMMONIA" in the last 168 hours. Coagulation Profile: No results for input(s): "INR", "PROTIME" in the last 168 hours. Cardiac Enzymes: No results for input(s): "CKTOTAL", "CKMB", "CKMBINDEX", "TROPONINI" in the last 168 hours. BNP (last 3 results) No results for input(s): "PROBNP" in the last 8760 hours. HbA1C: Recent Labs    04/19/23 1824  HGBA1C 7.3*    CBG: Recent Labs  Lab 04/20/23 1324 04/20/23 1617 04/20/23 2112 04/21/23 0559 04/21/23 1126  GLUCAP 159* 117* 146* 102* 135*   Lipid Profile: Recent Labs    04/19/23 1824  CHOL 293*  HDL 53  LDLCALC 224*  TRIG 78  CHOLHDL 5.5    Thyroid Function Tests: Recent Labs    04/19/23 1824  TSH 0.640    Anemia Panel: No results for input(s): "VITAMINB12", "FOLATE", "FERRITIN", "TIBC", "IRON", "RETICCTPCT" in the last 72 hours. Sepsis Labs: Recent Labs  Lab 04/19/23 1455 04/19/23 1741  LATICACIDVEN 1.2 1.4     Recent Results (from the past 240 hour(s))  Wet prep, genital     Status: Abnormal   Collection Time:  04/19/23  3:00 AM  Result Value Ref Range Status   Yeast Wet Prep HPF POC NONE SEEN NONE SEEN Final   Trich, Wet Prep PRESENT (A) NONE SEEN Final   Clue Cells Wet Prep HPF POC NONE SEEN NONE SEEN Final   WBC, Wet Prep HPF POC >=10 (A) <10 Final   Sperm NONE SEEN  Final    Comment: Performed at [facility], [address], [place], [place] [address]      Radiology Studies: ECHOCARDIOGRAM COMPLETE  Result Date: 04/20/2023    ECHOCARDIOGRAM REPORT   Patient Name:   HATLEY DELAO Date of Exam: 04/20/2023 Medical Rec #:  [id number]               Height:       59.0 in Accession #:    [id number]  Weight:       101.6 lb Date of Birth:  1961-10-16               BSA:          1.383 m Patient Age:    61 years                BP:           110/55 mmHg Patient Gender: F                       HR:           83 bpm. Exam Location:  Inpatient Procedure: 2D Echo, Intracardiac Opacification Agent, Cardiac Doppler and Color            Doppler Indications:    CAD  History:        Patient has prior history of Echocardiogram examinations, most                 recent 07/10/2022. Cardiomyopathy and CHF, CAD and Previous                 Myocardial Infarction, Prior Cardiac Surgery and Prior CABG;                 Risk Factors:Hypertension.  Sonographer:    Darlys Gales Referring Phys: [id number] JAN A MANSY IMPRESSIONS  1. Left ventricular ejection fraction, by estimation, is 20 to 25%. The left ventricle has severely decreased function. The left ventricle demonstrates global hypokinesis. The left ventricular internal cavity size was severely dilated. Left ventricular diastolic parameters are indeterminate.  2. Right ventricular systolic function is mildly reduced. The right ventricular size is normal. There is normal pulmonary artery systolic pressure. The estimated right ventricular systolic pressure is 19.8 mmHg.  3. Left atrial size was moderately dilated.  4. The mitral valve is normal in structure.  Mild mitral valve regurgitation.  5. The aortic valve was not well visualized. Aortic valve regurgitation is moderate. Aortic valve sclerosis/calcification is present, without any evidence of aortic stenosis.  6. The inferior vena cava is normal in size with greater than 50% respiratory variability, suggesting right atrial pressure of 3 mmHg. FINDINGS  Left Ventricle: Left ventricular ejection fraction, by estimation, is 20 to 25%. The left ventricle has severely decreased function. The left ventricle demonstrates global hypokinesis. Definity contrast agent was given IV to delineate the left ventricular endocardial borders. The left ventricular internal cavity size was severely dilated. There is no left ventricular hypertrophy. Left ventricular diastolic parameters are indeterminate. Right Ventricle: The right ventricular size is normal. No increase in right ventricular wall thickness. Right ventricular systolic function is mildly reduced. There is normal pulmonary artery systolic pressure. The tricuspid regurgitant velocity is 2.05 m/s, and with an assumed right atrial pressure of 3 mmHg, the estimated right ventricular systolic pressure is 19.8 mmHg. Left Atrium: Left atrial size was moderately dilated. Right Atrium: Right atrial size was normal in size. Pericardium: There is no evidence of pericardial effusion. Mitral Valve: The mitral valve is normal in structure. Mild mitral valve regurgitation. Tricuspid Valve: The tricuspid valve is normal in structure. Tricuspid valve regurgitation is trivial. Aortic Valve: The aortic valve was not well visualized. Aortic valve regurgitation is moderate. Aortic regurgitation PHT measures 236 msec. Aortic valve sclerosis/calcification is present, without any evidence of aortic stenosis. Aortic valve mean gradient measures 7.0 mmHg. Aortic valve peak gradient measures 13.4 mmHg. Aortic valve area, by VTI  measures 2.03 cm. Pulmonic Valve: The pulmonic valve was not well  visualized. Pulmonic valve regurgitation is not visualized. Aorta: The aortic root and ascending aorta are structurally normal, with no evidence of dilitation. Venous: The inferior vena cava is normal in size with greater than 50% respiratory variability, suggesting right atrial pressure of 3 mmHg. IAS/Shunts: The interatrial septum was not well visualized.  LEFT VENTRICLE PLAX 2D LVIDd:         6.40 cm   Diastology LVIDs:         5.70 cm   LV e' medial:    5.66 cm/s LV PW:         0.70 cm   LV E/e' medial:  10.3 LV IVS:        0.50 cm   LV e' lateral:   10.60 cm/s LVOT diam:     1.80 cm   LV E/e' lateral: 5.5 LV SV:         62 LV SV Index:   45 LVOT Area:     2.54 cm  RIGHT VENTRICLE RV S prime:     6.64 cm/s TAPSE (M-mode): 1.6 cm LEFT ATRIUM             Index        RIGHT ATRIUM          Index LA Vol (A2C):   53.8 ml 38.89 ml/m  RA Area:     8.20 cm LA Vol (A4C):   59.6 ml 43.09 ml/m  RA Volume:   12.80 ml 9.25 ml/m LA Biplane Vol: 58.1 ml 42.00 ml/m  AORTIC VALVE AV Area (Vmax):    1.85 cm AV Area (Vmean):   1.70 cm AV Area (VTI):     2.03 cm AV Vmax:           183.00 cm/s AV Vmean:          121.000 cm/s AV VTI:            0.306 m AV Peak Grad:      13.4 mmHg AV Mean Grad:      7.0 mmHg LVOT Vmax:         133.00 cm/s LVOT Vmean:        80.900 cm/s LVOT VTI:          0.244 m LVOT/AV VTI ratio: 0.80 AI PHT:            236 msec  AORTA Ao Root diam: 2.90 cm Ao Asc diam:  2.40 cm MITRAL VALVE               TRICUSPID VALVE MV Area (PHT): 4.96 cm    TR Peak grad:   16.8 mmHg MV Decel Time: 153 msec    TR Vmax:        205.00 cm/s MV E velocity: 58.20 cm/s MV A velocity: 42.60 cm/s  SHUNTS MV E/A ratio:  1.37        Systemic VTI:  0.24 m                            Systemic Diam: 1.80 cm Epifanio Lesches MD Electronically signed by Epifanio Lesches MD Signature Date/Time: 04/20/2023/12:38:43 PM    Final     Scheduled Meds:  apixaban  2.5 mg Oral BID   aspirin EC  81 mg Oral Daily   atorvastatin  80  mg Oral Daily   digoxin  0.125 mg Oral Daily  feeding supplement  237 mL Oral TID BM   fluticasone furoate-vilanterol  1 puff Inhalation Daily   And   umeclidinium bromide  1 puff Inhalation Daily   insulin aspart  0-15 Units Subcutaneous TID WC   insulin aspart  0-5 Units Subcutaneous QHS   ivabradine  7.5 mg Oral BID WC   potassium chloride  30 mEq Oral BID   sodium chloride flush  3 mL Intravenous Q12H   Continuous Infusions:  metronidazole 500 mg (04/21/23 0314)     LOS: 2 days   Time spent: 35 minutes   Janyla Biscoe Estill Cotta, MD [facility]  If 7PM-7AM, please contact night-coverage [email or web address] 04/21/2023, 2:51 PM

## 2023-04-22 DIAGNOSIS — I214 Non-ST elevation (NSTEMI) myocardial infarction: Secondary | ICD-10-CM | POA: Diagnosis not present

## 2023-04-22 LAB — BASIC METABOLIC PANEL
Anion gap: 6 (ref 5–15)
BUN: 28 mg/dL — ABNORMAL HIGH (ref 8–23)
CO2: 22 mmol/L (ref 22–32)
Calcium: 9.2 mg/dL (ref 8.9–10.3)
Chloride: 105 mmol/L (ref 98–111)
Creatinine, Ser: 1.38 mg/dL — ABNORMAL HIGH (ref 0.44–1.00)
GFR, Estimated: 43 mL/min — ABNORMAL LOW (ref >60)
Glucose, Bld: 170 mg/dL — ABNORMAL HIGH (ref 70–99)
Potassium: 5.3 mmol/L — ABNORMAL HIGH (ref 3.5–5.1)
Sodium: 133 mmol/L — ABNORMAL LOW (ref 135–145)

## 2023-04-22 LAB — CBC
HCT: 39.4 % (ref 36.0–46.0)
Hemoglobin: 12.4 g/dL (ref 12.0–15.0)
MCH: 28.4 pg (ref 26.0–34.0)
MCHC: 31.5 g/dL (ref 30.0–36.0)
MCV: 90.2 fL (ref 80.0–100.0)
Platelets: 169 10*3/uL (ref 150–400)
RBC: 4.37 MIL/uL (ref 3.87–5.11)
RDW: 19.9 % — ABNORMAL HIGH (ref 11.5–15.5)
WBC: 14.4 10*3/uL — ABNORMAL HIGH (ref 4.0–10.5)
nRBC: 0 % (ref 0.0–0.2)

## 2023-04-22 LAB — GLUCOSE, CAPILLARY
Glucose-Capillary: 101 mg/dL — ABNORMAL HIGH (ref 70–99)
Glucose-Capillary: 139 mg/dL — ABNORMAL HIGH (ref 70–99)

## 2023-04-22 MED ORDER — SODIUM ZIRCONIUM CYCLOSILICATE 10 G PO PACK
10.0000 g | PACK | ORAL | Status: AC
  Administered 2023-04-22: 10 g via ORAL
  Filled 2023-04-22: qty 1

## 2023-04-22 MED ORDER — ATORVASTATIN CALCIUM 80 MG PO TABS: 80.0000 mg | ORAL_TABLET | Freq: Every day | ORAL | 0 refills | Status: DC

## 2023-04-22 MED ORDER — METRONIDAZOLE 500 MG PO TABS: 500.0000 mg | ORAL_TABLET | Freq: Two times a day (BID) | ORAL | 0 refills | Status: AC

## 2023-04-22 NOTE — Progress Notes (Signed)
Patient eating breakfast at this time. Requesting for medications to be given after she eats.

## 2023-04-22 NOTE — Discharge Summary (Signed)
Discharge Summary  Brittany [name] ZOX:[id number] DOB: 63-Apr-1962  PCP: Pcp, No  Admit date: 04/18/2023 Discharge date: 04/22/2023  Time spent: 35 minutes   Recommendations for Outpatient Follow-up:  Follow-up with your cardiologist within a week.  Discharge Diagnoses:  Active Hospital Problems   Diagnosis Date Noted   NSTEMI (non-ST elevated myocardial infarction) (HCC) 02/12/2019   Troponin level elevated 11/14/2022    Priority: 3.   Chronic HFrEF (heart failure with reduced ejection fraction) (HCC) 12/11/2021    Priority: 5.   AF (paroxysmal atrial fibrillation) (HCC) 03/12/2014    Priority: 6.   CKD (chronic kidney disease) stage 3, GFR 30-59 ml/min (HCC) 09/29/2021    Priority: Medium    Trichomonas infection 04/19/2023   Prolonged QT interval 04/09/2022   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   Essential hypertension 05/07/2012    Resolved Hospital Problems  No resolved problems to display.    Discharge Condition: Stable.  Diet recommendation: Resume previous diet.  Vitals:   04/22/23 0905 04/22/23 1023  BP:  (!) 97/59  Pulse:    Resp:  19  Temp:    SpO2: 96%     History of present illness:   Brittany [name] is a 62 y.o. female with medical history significant of HFrEF 20 to 25% with AICD,  DM2, HTN, paroxysmal afib, and CAD s/p CABG presenting with flank pain, associated with nausea and vomiting.  CT renal stone study done on 04/18/2023 was unrevealing.  UA/wet prep showed trichomonas.  The patient strictly denies sexual relations for years.  She received IV Flagyl for 2 full days.  04/22/2023: The patient was seen at bedside.  There were no acute events overnight.  She states she feels better today and wants to go home.  Discussed with cardiology, Dr. Wyline Mood, hold off spironolactone due to hyperkalemia.  Resume rest of home cardiac medications.   Hospital Course:  Principal Problem:   NSTEMI (non-ST elevated  myocardial infarction) (HCC) Active Problems:   Troponin level elevated   Chronic HFrEF (heart failure with reduced ejection fraction) (HCC)   AF (paroxysmal atrial fibrillation) (HCC)   Essential hypertension   ICD (implantable cardioverter-defibrillator) in place   Prolonged QT interval   Trichomonas infection   CKD (chronic kidney disease) stage 3, GFR 30-59 ml/min (HCC)  Resolved acute hypoxemic respiratory failure:  NSTEMI, seen by cardiology, no plan for ischemic evaluation. Not on aspirin, per cardiology, due to bleeding history. Resume home cardiac medication except for spironolactone due to hyperkalemia.  Hyperkalemia Serum potassium 5.3 Spironolactone held.  HFrEF 20 to 25% status post AICD placement. Repeat echo done on 04/20/2023 Continue strict I's and O's and daily weight Follow-up with cardiology outpatient.   Left flank pain Unclear etiology, CT scan was unrevealing.  Trichomoniasis Leukocytosis WBC 14.4K Afebrile, nonseptic appearing UA negative for pyuria, chest x-ray non-acute The patient strictly denies sexual relation for years. Received 2 full days of IV Flagyl. Complete total of 7 days of Flagyl.   Discharged home on 5 days of p.o. Flagyl 500 mg twice daily.   AKI suspect prerenal Baseline creatinine 0.9 with GFR greater than 60 Presented with creatinine up to 1.5 Creatinine is downtrending to 1.3. Continue to avoid nephrotoxic agents   HLD, not controlled LDL 224 with goal LDL less than 70. Continue high intensity statin, Lipitor 80 mg nightly.   Type 2 diabetes, with hyperlipidemia Hemoglobin A1c 7.3 on 04/19/2023. Continue home [place].  Coronary artery disease status post CABG Per cardiology no aspirin due  to bleeding history The patient is on Eliquis for CVA prevention  Paroxysmal A-fib on Eliquis Resume home digoxin and home Eliquis.   COPD Continue home Trelegy, as needed albuterol.     DVT prophylaxis: Eliquis  Code Status:  Full code    Consultants:  Cardiology   Procedures:  2D echo on 04/20/2023.  Discharge Exam: BP (!) 97/59 (BP Location: Right Arm)   Pulse 83   Temp 98.9 F (37.2 C) (Oral)   Resp 19   Ht 4\' 11"  (1.499 m)   Wt 46.1 kg   SpO2 96%   BMI 20.53 kg/m  General: 62 y.o. year-old female well developed well nourished in no acute distress.  Alert and oriented x3. Cardiovascular: Regular rate and rhythm with no rubs or gallops.  No thyromegaly or JVD noted.   Respiratory: Clear to auscultation with no wheezes or rales. Good inspiratory effort. Abdomen: Soft nontender nondistended with normal bowel sounds x4 quadrants. Musculoskeletal: No lower extremity edema. 2/4 pulses in all 4 extremities. Skin: No ulcerative lesions noted or rashes, Psychiatry: Mood is appropriate for condition and setting  Discharge Instructions You were cared for by a hospitalist during your hospital stay. If you have any questions about your discharge medications or the care you received while you were in the hospital after you are discharged, you can call the unit and asked to speak with the hospitalist on call if the hospitalist that took care of you is not available. Once you are discharged, your primary care physician will handle any further medical issues. Please note that NO REFILLS for any discharge medications will be authorized once you are discharged, as it is imperative that you return to your primary care physician (or establish a relationship with a primary care physician if you do not have one) for your aftercare needs so that they can reassess your need for medications and monitor your lab values.   Allergies as of 04/22/2023       Reactions   Lactose Intolerance (gi)    Per patient   Codeine Nausea And Vomiting        Medication List     STOP taking these medications    potassium chloride 10 MEQ tablet Commonly known as: KLOR-CON M   spironolactone 25 MG tablet Commonly known as:  ALDACTONE       TAKE these medications    albuterol 108 (90 Base) MCG/ACT inhaler Commonly known as: VENTOLIN HFA Inhale 2 puffs into the lungs every 6 (six) hours as needed for wheezing or shortness of breath.   apixaban 2.5 MG Tabs tablet Commonly known as: ELIQUIS Take 1 tablet (2.5 mg total) by mouth 2 (two) times daily.   atorvastatin 80 MG tablet Commonly known as: LIPITOR Take 1 tablet (80 mg total) by mouth daily. Start taking on: Apr 23, 2023   blood glucose meter kit and supplies Kit Dispense based on patient and insurance preference. Use up to four times daily as directed.   dapagliflozin propanediol 10 MG Tabs tablet Commonly known as: Farxiga Take 1 tablet by mouth daily.   digoxin 0.125 MG tablet Commonly known as: LANOXIN Take 1 tablet (0.125 mg total) by mouth daily.   Ensure Original Liqd Take 2 Bottles by mouth daily.   ipratropium-albuterol 0.5-2.5 (3) MG/3ML Soln Commonly known as: DUONEB Take 3 mLs by nebulization every 4 (four) hours as needed (for shortness of breath).   ivabradine 7.5 MG Tabs tablet Commonly known as: Corlanor Take 1 tablet (7.5  mg total) by mouth 2 (two) times daily with a meal. TAKE 1 TABLET (7.5 MG TOTAL) BY MOUTH 2 (TWO) TIMES DAILY WITH A MEAL.   metolazone 2.5 MG tablet Commonly known as: ZAROXOLYN Take 1 tablet (2.5 mg total) by mouth as directed.   metroNIDAZOLE 500 MG tablet Commonly known as: FLAGYL Take 1 tablet (500 mg total) by mouth 2 (two) times daily for 5 days.   multivitamin with minerals Tabs tablet Take 1 tablet by mouth daily.   nitroGLYCERIN 0.4 MG SL tablet Commonly known as: NITROSTAT Place 1 tablet (0.4 mg total) under the tongue every 5 (five) minutes as needed for chest pain.   torsemide 20 MG tablet Commonly known as: DEMADEX Take 60 mg by mouth daily.   Trelegy Ellipta 200-62.5-25 MCG/ACT Aepb Generic drug: Fluticasone-Umeclidin-Vilant Inhale 1 puff into the lungs daily.   Visine  0.025-0.3 % ophthalmic solution Generic drug: naphazoline-pheniramine Place 1 drop into both eyes 4 (four) times daily as needed for eye irritation.       Allergies  Allergen Reactions   Lactose Intolerance (Gi)     Per patient   Codeine Nausea And Vomiting    Follow-up Information     Bensimhon, Bevelyn Buckles, MD. Call today.   Specialty: Cardiology Why: Please call for a post hospital follow up appointment. Contact information: [address] [place] [address] [phone]         [facility] Follow up.   Why: To obtain a Primary Care Physician you can call the toll free number on your insurance card and request a providers list for your area. You may also visit the insurance provider online resource.  Or you can contact [facility] for assistance. Contact information: [phone] (for physician referral list assistance) or Call our [facility] physician referral line at [phone].  For [facility] network you may go to [email or web address] and click on Find a doctor                 The results of significant diagnostics from this hospitalization (including imaging, microbiology, ancillary and laboratory) are listed below for reference.    Significant Diagnostic Studies: ECHOCARDIOGRAM COMPLETE  Result Date: 04/20/2023    ECHOCARDIOGRAM REPORT   Patient Name:   MEEKA KANITZ Date of Exam: 04/20/2023 Medical Rec #:  [id number]               Height:       59.0 in Accession #:    [id number]              Weight:       101.6 lb Date of Birth:  01/28/1961               BSA:          1.383 m Patient Age:    61 years                BP:           110/55 mmHg Patient Gender: F                       HR:           83 bpm. Exam Location:  Inpatient Procedure: 2D Echo, Intracardiac Opacification Agent, Cardiac Doppler and Color            Doppler Indications:    CAD  History:        Patient has prior  history of Echocardiogram examinations,  most                 recent 07/10/2022. Cardiomyopathy and CHF, CAD and Previous                 Myocardial Infarction, Prior Cardiac Surgery and Prior CABG;                 Risk Factors:Hypertension.  Sonographer:    Darlys Gales Referring Phys: [id number] JAN A MANSY IMPRESSIONS  1. Left ventricular ejection fraction, by estimation, is 20 to 25%. The left ventricle has severely decreased function. The left ventricle demonstrates global hypokinesis. The left ventricular internal cavity size was severely dilated. Left ventricular diastolic parameters are indeterminate.  2. Right ventricular systolic function is mildly reduced. The right ventricular size is normal. There is normal pulmonary artery systolic pressure. The estimated right ventricular systolic pressure is 19.8 mmHg.  3. Left atrial size was moderately dilated.  4. The mitral valve is normal in structure. Mild mitral valve regurgitation.  5. The aortic valve was not well visualized. Aortic valve regurgitation is moderate. Aortic valve sclerosis/calcification is present, without any evidence of aortic stenosis.  6. The inferior vena cava is normal in size with greater than 50% respiratory variability, suggesting right atrial pressure of 3 mmHg. FINDINGS  Left Ventricle: Left ventricular ejection fraction, by estimation, is 20 to 25%. The left ventricle has severely decreased function. The left ventricle demonstrates global hypokinesis. Definity contrast agent was given IV to delineate the left ventricular endocardial borders. The left ventricular internal cavity size was severely dilated. There is no left ventricular hypertrophy. Left ventricular diastolic parameters are indeterminate. Right Ventricle: The right ventricular size is normal. No increase in right ventricular wall thickness. Right ventricular systolic function is mildly reduced. There is normal pulmonary artery systolic pressure. The tricuspid regurgitant velocity is 2.05 m/s, and with an assumed  right atrial pressure of 3 mmHg, the estimated right ventricular systolic pressure is 19.8 mmHg. Left Atrium: Left atrial size was moderately dilated. Right Atrium: Right atrial size was normal in size. Pericardium: There is no evidence of pericardial effusion. Mitral Valve: The mitral valve is normal in structure. Mild mitral valve regurgitation. Tricuspid Valve: The tricuspid valve is normal in structure. Tricuspid valve regurgitation is trivial. Aortic Valve: The aortic valve was not well visualized. Aortic valve regurgitation is moderate. Aortic regurgitation PHT measures 236 msec. Aortic valve sclerosis/calcification is present, without any evidence of aortic stenosis. Aortic valve mean gradient measures 7.0 mmHg. Aortic valve peak gradient measures 13.4 mmHg. Aortic valve area, by VTI measures 2.03 cm. Pulmonic Valve: The pulmonic valve was not well visualized. Pulmonic valve regurgitation is not visualized. Aorta: The aortic root and ascending aorta are structurally normal, with no evidence of dilitation. Venous: The inferior vena cava is normal in size with greater than 50% respiratory variability, suggesting right atrial pressure of 3 mmHg. IAS/Shunts: The interatrial septum was not well visualized.  LEFT VENTRICLE PLAX 2D LVIDd:         6.40 cm   Diastology LVIDs:         5.70 cm   LV e' medial:    5.66 cm/s LV PW:         0.70 cm   LV E/e' medial:  10.3 LV IVS:        0.50 cm   LV e' lateral:   10.60 cm/s LVOT diam:     1.80 cm   LV E/e' lateral:  5.5 LV SV:         62 LV SV Index:   45 LVOT Area:     2.54 cm  RIGHT VENTRICLE RV S prime:     6.64 cm/s TAPSE (M-mode): 1.6 cm LEFT ATRIUM             Index        RIGHT ATRIUM          Index LA Vol (A2C):   53.8 ml 38.89 ml/m  RA Area:     8.20 cm LA Vol (A4C):   59.6 ml 43.09 ml/m  RA Volume:   12.80 ml 9.25 ml/m LA Biplane Vol: 58.1 ml 42.00 ml/m  AORTIC VALVE AV Area (Vmax):    1.85 cm AV Area (Vmean):   1.70 cm AV Area (VTI):     2.03 cm AV Vmax:            183.00 cm/s AV Vmean:          121.000 cm/s AV VTI:            0.306 m AV Peak Grad:      13.4 mmHg AV Mean Grad:      7.0 mmHg LVOT Vmax:         133.00 cm/s LVOT Vmean:        80.900 cm/s LVOT VTI:          0.244 m LVOT/AV VTI ratio: 0.80 AI PHT:            236 msec  AORTA Ao Root diam: 2.90 cm Ao Asc diam:  2.40 cm MITRAL VALVE               TRICUSPID VALVE MV Area (PHT): 4.96 cm    TR Peak grad:   16.8 mmHg MV Decel Time: 153 msec    TR Vmax:        205.00 cm/s MV E velocity: 58.20 cm/s MV A velocity: 42.60 cm/s  SHUNTS MV E/A ratio:  1.37        Systemic VTI:  0.24 m                            Systemic Diam: 1.80 cm Epifanio Lesches MD Electronically signed by Epifanio Lesches MD Signature Date/Time: 04/20/2023/12:38:43 PM    Final    CT RENAL STONE STUDY  Result Date: 04/18/2023 CLINICAL DATA:  Left-sided flank pain for several hours, initial encounter EXAM: CT ABDOMEN AND PELVIS WITHOUT CONTRAST TECHNIQUE: Multidetector CT imaging of the abdomen and pelvis was performed following the standard protocol without IV contrast. RADIATION DOSE REDUCTION: This exam was performed according to the departmental dose-optimization program which includes automated exposure control, adjustment of the mA and/or kV according to patient size and/or use of iterative reconstruction technique. COMPARISON:  12/10/2021 FINDINGS: Lower chest: Mild scarring is noted in the bases bilaterally stable from the prior exam. Hepatobiliary: No focal liver abnormality is seen. No gallstones, gallbladder wall thickening, or biliary dilatation. Pancreas: Unremarkable. No pancreatic ductal dilatation or surrounding inflammatory changes. Spleen: Normal in size without focal abnormality. Adrenals/Urinary Tract: Adrenal glands are within normal limits. No renal calculi or obstructive changes are noted. No focal mass is seen. The bladder is decompressed. Stomach/Bowel: No obstructive or inflammatory changes of the colon are  seen. The appendix is within normal limits. Small bowel and stomach are within normal limits. Vascular/Lymphatic: Aortic atherosclerosis. No enlarged abdominal or pelvic lymph nodes. Reproductive:  Status post hysterectomy. No adnexal masses. Other: No abdominal wall hernia or abnormality. No abdominopelvic ascites. Musculoskeletal: No acute or significant osseous findings. IMPRESSION: No acute abnormality is noted. Electronically Signed   By: Alcide Clever M.D.   On: 04/18/2023 23:59   DG Chest Portable 1 View  Result Date: 04/18/2023 CLINICAL DATA:  Shortness of breath. EXAM: PORTABLE CHEST 1 VIEW COMPARISON:  Chest radiograph dated 03/03/2023. FINDINGS: No focal consolidation, pleural effusion, pneumothorax. The lungs are hyperinflated with flattening of the diaphragms. Mild cardiomegaly. Median sternotomy wires, CABG vascular clips, and left pectoral AICD device. Stimulator device over the right chest. No acute osseous pathology. IMPRESSION: 1. No active disease. 2. Mild cardiomegaly. Electronically Signed   By: Elgie Collard M.D.   On: 04/18/2023 22:54    Microbiology: Recent Results (from the past 240 hour(s))  Wet prep, genital     Status: Abnormal   Collection Time: 04/19/23  3:00 AM  Result Value Ref Range Status   Yeast Wet Prep HPF POC NONE SEEN NONE SEEN Final   Trich, Wet Prep PRESENT (A) NONE SEEN Final   Clue Cells Wet Prep HPF POC NONE SEEN NONE SEEN Final   WBC, Wet Prep HPF POC >=10 (A) <10 Final   Sperm NONE SEEN  Final    Comment: Performed at [facility], [address] [place], [place] [address]     Labs: Basic Metabolic Panel: Recent Labs  Lab 04/18/23 2240 04/19/23 0631 04/20/23 0057 04/21/23 0657 04/22/23 0036  NA 135 133* 132* 133* 133*  K 5.3* 4.3 4.5 4.9 5.3*  CL 101 99 100 104 105  CO2 23 23 22 22 22   GLUCOSE 117* 167* 130* 117* 170*  BUN 27* 28* 27* 26* 28*  CREATININE 1.46* 1.63* 1.63* 1.51* 1.38*  CALCIUM 9.2 9.2 9.0 8.9 9.2  MG 2.3  --   --   2.2  --    Liver Function Tests: Recent Labs  Lab 04/18/23 2240  AST 73*  ALT 28  ALKPHOS 76  BILITOT 0.6  PROT 7.1  ALBUMIN 3.8   Recent Labs  Lab 04/18/23 2240  LIPASE 30   No results for input(s): "AMMONIA" in the last 168 hours. CBC: Recent Labs  Lab 04/18/23 2240 04/20/23 0057 04/20/23 0924 04/21/23 0657 04/22/23 0036  WBC 12.5* 17.2* 17.4* 13.5* 14.4*  NEUTROABS 10.3*  --   --   --   --   HGB 15.6* 14.6 13.8 12.6 12.4  HCT 49.3* 44.7 44.2 40.7 39.4  MCV 89.8 88.5 90.4 92.1 90.2  PLT 259 182 178 149* 169   Cardiac Enzymes: No results for input(s): "CKTOTAL", "CKMB", "CKMBINDEX", "TROPONINI" in the last 168 hours. BNP: BNP (last 3 results) Recent Labs    03/01/23 1635 03/07/23 1523 04/18/23 2240  BNP 1,397.9* 1,563.4* 1,203.6*    ProBNP (last 3 results) No results for input(s): "PROBNP" in the last 8760 hours.  CBG: Recent Labs  Lab 04/21/23 1126 04/21/23 1604 04/21/23 2121 04/22/23 0640 04/22/23 1132  GLUCAP 135* 96 156* 101* 139*       Signed:  Darlin Drop, MD [facility] 04/22/2023, 12:55 PM

## 2023-04-23 ENCOUNTER — Other Ambulatory Visit (HOSPITAL_COMMUNITY): Payer: Self-pay | Admitting: Family Medicine

## 2023-04-23 ENCOUNTER — Ambulatory Visit (INDEPENDENT_AMBULATORY_CARE_PROVIDER_SITE_OTHER): Payer: BC Managed Care – PPO

## 2023-04-23 DIAGNOSIS — I5022 Chronic systolic (congestive) heart failure: Secondary | ICD-10-CM | POA: Diagnosis not present

## 2023-04-23 DIAGNOSIS — Z95810 Presence of automatic (implantable) cardiac defibrillator: Secondary | ICD-10-CM

## 2023-04-24 ENCOUNTER — Telehealth (HOSPITAL_COMMUNITY): Payer: Self-pay | Admitting: Emergency Medicine

## 2023-04-24 NOTE — Telephone Encounter (Signed)
[name] text me this morning to request to move today's appointment until next week.  I advised her that I would really like to see her today if possible but she stated she preferred next week.  Scheduled for 5/21 @10 :00.  Also reviewed her med changes from discharge and she advised she is understands same.    [name], EMT-Paramedic [phone] 04/24/2023

## 2023-04-26 ENCOUNTER — Telehealth: Payer: Self-pay

## 2023-04-26 NOTE — Telephone Encounter (Signed)
Pt agreed to send missed transmission.

## 2023-04-27 ENCOUNTER — Telehealth: Payer: Self-pay

## 2023-04-27 NOTE — Telephone Encounter (Signed)
Remote ICM transmission received.  Attempted call to patient regarding ICM remote transmission and no voice mail set up. 

## 2023-04-27 NOTE — Progress Notes (Signed)
Spoke with patient and heart failure questions reviewed.  Transmission results reviewed.  Pt asymptomatic for fluid accumulation.  Reports feeling well at this time and voices no complaints.  She is feeling okay since hospital discharge. Recommendation to limit salt intake to 2000 mg daily and fluid intake to 64 oz daily.  Encouraged to call if experiencing any fluid symptoms.  No changes and encouraged to call if experiencing any fluid symptoms.

## 2023-04-27 NOTE — Progress Notes (Signed)
Remote ICD transmission.   

## 2023-04-27 NOTE — Progress Notes (Signed)
EPIC Encounter for ICM Monitoring  Patient Name: Brittany [name] is a 62 y.o. female Date: 04/27/2023 Primary Care Physican: Pcp, No Primary Cardiologist: Bensimhon Electrophysiologist: Lalla Brothers 11/28/2022 Weight: 103 lbs 12/20/2022 Weight: 100 lbs 02/13/2023 Weight: 100-102 lbs 03/20/2023 Weight: 100 lbs   Attempted call to patient and unable to reach.   Transmission reviewed.     Hospitalization 5/8-5/12   Optivol Thoracic impedance suggesting possible fluid accumulation starting 5/9 and trending toward baseline.   Barostim implant.   Prescribed: Torsemide 20 mg take 4 tablets (80 mg total) by mouth every morning and 2 tablets (40 mg total) every evening.  Metolazone 2.5 mg take 1 tablet by mouth as needed   Labs: 04/22/2023 Creatinine 1.38, BUN 28, Potassium 5.3, Sodium 133, GFR 43  04/21/2023 Creatinine 1.51, BUN 26, Potassium 4.9, Sodium 133, GFR 39  04/20/2023 Creatinine 1.63, BUN 27, Potassium 4.5, Sodium 132, GFR 35  04/19/2023 Creatinine 1.63, BUN 28, Potassium 4.3, Sodium 133, GFR 35 04/18/2023 Creatinine 1.46, BUN 27, Potassium 5.3, Sodium 135, GFR 40  03/15/2023 Creatinine 1.14, BUN 36, Potassium 3.3, Sodium 140, GFR 54 03/07/2023 Creatinine 1.29, BUN 34, Potassium 4.5, Sodium 136, GFR 47 03/03/2023 Creatinine 1.31, BUN 34, Potassium 3.8, Sodium 134, GFR 46 01/29/2023 Creatinine 1.14, BUN 21, Potassium 4.1, Sodium 136, GFR 54 01/15/2023 Creatinine 1.37, BUN 19, Potassium 3.4, Sodium 135, GFR 44 12/19/2022 Creatinine 1.28, BUN 14, Potassium 3.4, Sodium 138, GFR 47 (resulting in an increase in K+ dosage) A complete set of results can be found in Results Review.   Recommendations:  Unable to reach.     Follow-up plan: ICM clinic phone appointment on 05/08/2023 to recheck fluid levels.   91 day device clinic remote transmission 06/20/2023.     EP/Cardiology Office Visits:  05/02/2023 with HF clinic.      Copy of ICM check sent to Dr. Lalla Brothers.   3 month ICM trend:  04/26/2023.    12-14 Month ICM trend:     Karie Soda, RN 04/27/2023 7:33 AM

## 2023-05-01 ENCOUNTER — Other Ambulatory Visit (HOSPITAL_COMMUNITY): Payer: Self-pay | Admitting: Emergency Medicine

## 2023-05-01 NOTE — Progress Notes (Signed)
Paramedicine Encounter    Patient ID: Brittany [name], female    DOB: 1961/01/05, 62 y.o.   MRN: [id number]   Complaints NONE  Assessment A&O x 4, skin W&D w/ good color.  Compliance with meds pt. States she is compliant  Pill box filled takes meds from bottles  Refills needed none  Meds changes since last visit st pped Potassium & Spironolactone   Social changes none   BP 100/60 (BP Location: Left Arm, Patient Position: Sitting, Cuff Size: Normal)   Pulse 87   Wt 96 lb (43.5 kg)   SpO2 97%   BMI 19.39 kg/m  Weight yesterday- not taken Last visit weight-96lb  ATF Brittany [name] feeling "okay" since her discharge from her recent hospital admission.  She tells me she is compliant with all medications and does not utilize a pill box.  When reviewing meds she is able to recite milligrams of meds, doses and frequency w/o incident..  She states, "I'm so tired of going to doctors."  She denies smoking cigarettes but does admit to [facility] use that she says she uses to help her with her pain and appetite.  She admits to alcohol consumption and says she drinks wine.  Several bottles were visible in the room. I advised her that she needs to get established with a PCP and she says, "I'm tired of going to doctors."  I encouraged her to pick a PCP soon and make an appointment so she can at least get established. Brittany [name] tells me that when she was in the hospital she was diagnosed with Trichomonas but states she is not sure how this happened because, "I haven't had sex since 2019."  I advised her that this is another issue that she could potentially discuss with a primary care physician if she had one. Pt has a pending appointment with [facility] tomorrow @ 12:00.   ACTION: Home visit completed  Bethanie Dicker [phone] 05/01/23  Patient Care Team: Pcp, No as PCP - Kevin Fenton, MD (Inactive) as PCP - Electrophysiology (Cardiology) Bensimhon,  Bevelyn Buckles, MD as PCP - Advanced Heart Failure (Cardiology)  Patient Active Problem List   Diagnosis Date Noted   Trichomonas infection 04/19/2023   Presence of heart assist device (HCC) 04/11/2023   COPD with acute exacerbation (HCC) 03/01/2023   CAD (coronary artery disease) 03/01/2023   Acute respiratory failure with hypoxia (HCC) 11/14/2022   Sepsis (HCC) 11/14/2022   Influenza A with pneumonia 11/14/2022   Troponin level elevated 11/14/2022   History of lung cancer 11/14/2022   Acute combined systolic and diastolic CHF, NYHA class 4 (HCC) 07/09/2022   Malnutrition of moderate degree 06/20/2022   Lactic acidosis 04/12/2022   Prolonged QT interval 04/09/2022   Hypotension 04/09/2022   Chronic HFrEF (heart failure with reduced ejection fraction) (HCC) 12/11/2021   CHF exacerbation (HCC) 12/10/2021   Renal infarct (HCC) 12/10/2021   COPD exacerbation (HCC) 09/29/2021   Mixed diabetic hyperlipidemia associated with type 2 diabetes mellitus (HCC) 09/29/2021   CKD (chronic kidney disease) stage 3, GFR 30-59 ml/min (HCC) 09/29/2021   Unstable angina (HCC) 02/09/2021   Hemoptysis 10/20/2020   Type 2 diabetes mellitus with stage 3a chronic kidney disease, without long-term current use of insulin (HCC) 10/20/2020   Acute on chronic combined systolic and diastolic CHF (congestive heart failure) (HCC) 05/21/2020   Ischemic cardiomyopathy 04/12/2020   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   NSTEMI (non-ST elevated myocardial infarction) (HCC) 02/12/2019   CHF (congestive  heart failure) (HCC) 11/24/2018   Recurrent pleural effusion on right 05/05/2018   Acute on chronic respiratory failure with hypoxia (HCC) 05/05/2018   Chronic respiratory failure with hypoxia (HCC)    Asthma    Anxiety    Allergy    HCAP (healthcare-associated pneumonia) 03/15/2015   Chest pain 03/15/2015   Small cell lung cancer (HCC) 03/16/2014   Protein-calorie malnutrition, severe (HCC)  03/14/2014   AF (paroxysmal atrial fibrillation) (HCC) 03/12/2014   Essential hypertension 05/07/2012    Current Outpatient Medications:    albuterol (VENTOLIN HFA) 108 (90 Base) MCG/ACT inhaler, Inhale 2 puffs into the lungs every 6 (six) hours as needed for wheezing or shortness of breath., Disp: 8.5 g, Rfl: 1   apixaban (ELIQUIS) 2.5 MG TABS tablet, Take 1 tablet (2.5 mg total) by mouth 2 (two) times daily., Disp: 180 tablet, Rfl: 1   atorvastatin (LIPITOR) 80 MG tablet, Take 1 tablet (80 mg total) by mouth daily., Disp: 90 tablet, Rfl: 0   blood glucose meter kit and supplies KIT, Dispense based on patient and insurance preference. Use up to four times daily as directed., Disp: 1 each, Rfl: 0   dapagliflozin propanediol (FARXIGA) 10 MG TABS tablet, Take 1 tablet by mouth daily., Disp: 30 tablet, Rfl: 11   digoxin (LANOXIN) 0.125 MG tablet, TAKE 1 TABLET BY MOUTH DAILY, Disp: 90 tablet, Rfl: 3   Fluticasone-Umeclidin-Vilant (TRELEGY ELLIPTA) 200-62.5-25 MCG/ACT AEPB, Inhale 1 puff into the lungs daily., Disp: 60 each, Rfl: 5   ipratropium-albuterol (DUONEB) 0.5-2.5 (3) MG/3ML SOLN, Take 3 mLs by nebulization every 4 (four) hours as needed (for shortness of breath)., Disp: 90 mL, Rfl: 3   ivabradine (CORLANOR) 7.5 MG TABS tablet, Take 1 tablet (7.5 mg total) by mouth 2 (two) times daily with a meal. TAKE 1 TABLET (7.5 MG TOTAL) BY MOUTH 2 (TWO) TIMES DAILY WITH A MEAL., Disp: 180 tablet, Rfl: 1   metolazone (ZAROXOLYN) 2.5 MG tablet, Take 1 tablet (2.5 mg total) by mouth as directed., Disp: 15 tablet, Rfl: 1   nitroGLYCERIN (NITROSTAT) 0.4 MG SL tablet, Place 1 tablet (0.4 mg total) under the tongue every 5 (five) minutes as needed for chest pain., Disp: 90 tablet, Rfl: 5   Nutritional Supplements (ENSURE ORIGINAL) LIQD, Take 2 Bottles by mouth daily., Disp: , Rfl:    torsemide (DEMADEX) 20 MG tablet, Take 60 mg by mouth daily., Disp: , Rfl:    Multiple Vitamin (MULTIVITAMIN WITH MINERALS) TABS  tablet, Take 1 tablet by mouth daily. (Patient not taking: Reported on 05/01/2023), Disp: , Rfl:    naphazoline-pheniramine (VISINE) 0.025-0.3 % ophthalmic solution, Place 1 drop into both eyes 4 (four) times daily as needed for eye irritation. (Patient not taking: Reported on 05/01/2023), Disp: , Rfl:  Allergies  Allergen Reactions   Lactose Intolerance (Gi)     Per patient   Codeine Nausea And Vomiting     Social History   Socioeconomic History   Marital status: Married    Spouse name: Tristan Schroeder    Number of children: 1   Years of education: Not on file   Highest education level: Not on file  Occupational History   Occupation: disabled  Tobacco Use   Smoking status: Former    Packs/day: 1.00    Years: 20.00    Additional pack years: 0.00    Total pack years: 20.00    Types: Cigarettes    Quit date: 03/13/2014    Years since quitting: 9.1   Smokeless  tobacco: Never  Vaping Use   Vaping Use: Never used  Substance and Sexual Activity   Alcohol use: Yes    Alcohol/week: 5.0 standard drinks of alcohol    Types: 5 Glasses of wine per week    Comment: a few glasses of wine weekly   Drug use: Yes    Types: Marijuana    Comment: "medicinal", not daily   Sexual activity: Never  Other Topics Concern   Not on file  Social History Narrative   Not on file   Social Determinants of Health   Financial Resource Strain: Medium Risk (02/11/2021)   Overall Financial Resource Strain (CARDIA)    Difficulty of Paying Living Expenses: Somewhat hard  Food Insecurity: No Food Insecurity (04/19/2023)   Hunger Vital Sign    Worried About Running Out of Food in the Last Year: Never true    Ran Out of Food in the Last Year: Never true  Transportation Needs: No Transportation Needs (04/19/2023)   PRAPARE - Administrator, Civil Service (Medical): No    Lack of Transportation (Non-Medical): No  Physical Activity: Insufficiently Active (02/11/2021)   Exercise Vital Sign    Days of  Exercise per Week: 2 days    Minutes of Exercise per Session: 10 min  Stress: Stress Concern Present (02/11/2021)   [employer] of Occupational Health - Occupational Stress Questionnaire    Feeling of Stress : To some extent  Social Connections: Unknown (02/11/2021)   Social Connection and Isolation Panel [NHANES]    Frequency of Communication with Friends and Family: More than three times a week    Frequency of Social Gatherings with Friends and Family: Twice a week    Attends Religious Services: Not on file    Active Member of Clubs or Organizations: Not on file    Attends Banker Meetings: Never    Marital Status: Married  Catering manager Violence: Not At Risk (04/19/2023)   Humiliation, Afraid, Rape, and Kick questionnaire    Fear of Current or Ex-Partner: No    Emotionally Abused: No    Physically Abused: No    Sexually Abused: No    Physical Exam      Future Appointments  Date Time Provider Department Center  05/02/2023 12:00 PM [facility] PA/NP [facility] None  05/02/2023  3:00 PM [facility] 2 [facility]  05/08/2023  7:40 AM [facility] DEVICE REMOTES [facility]  06/18/2023 11:00 AM [facility] RM [facility] None  06/18/2023  1:30 PM Olalere, Adewale A, MD [facility] None  06/20/2023 12:40 PM [facility] DEVICE REMOTES [facility]  09/19/2023 12:40 PM [facility] DEVICE REMOTES [facility]  12/19/2023 12:40 PM [facility] DEVICE REMOTES [facility]  03/19/2024 12:40 PM [facility] DEVICE REMOTES [facility]  06/18/2024 12:40 PM [facility] DEVICE REMOTES [facility]

## 2023-05-02 ENCOUNTER — Encounter (HOSPITAL_COMMUNITY): Payer: Self-pay

## 2023-05-02 ENCOUNTER — Ambulatory Visit (HOSPITAL_COMMUNITY)
Admission: RE | Admit: 2023-05-02 | Discharge: 2023-05-02 | Disposition: A | Discharge status: Home / Self Care | Payer: BC Managed Care – PPO | Source: Ambulatory Visit | Attending: Family Medicine | Admitting: Family Medicine

## 2023-05-02 ENCOUNTER — Ambulatory Visit (HOSPITAL_BASED_OUTPATIENT_CLINIC_OR_DEPARTMENT_OTHER)
Admission: RE | Admit: 2023-05-02 | Discharge: 2023-05-02 | Disposition: A | Discharge status: Home / Self Care | Payer: BC Managed Care – PPO | Source: Ambulatory Visit | Attending: Family Medicine | Admitting: Family Medicine

## 2023-05-02 ENCOUNTER — Encounter (HOSPITAL_COMMUNITY): Payer: BC Managed Care – PPO

## 2023-05-02 VITALS — BP 100/60 | HR 80 | Wt 98.8 lb

## 2023-05-02 DIAGNOSIS — J9611 Chronic respiratory failure with hypoxia: Secondary | ICD-10-CM

## 2023-05-02 DIAGNOSIS — J90 Pleural effusion, not elsewhere classified: Secondary | ICD-10-CM | POA: Insufficient documentation

## 2023-05-02 DIAGNOSIS — I480 Paroxysmal atrial fibrillation: Secondary | ICD-10-CM | POA: Insufficient documentation

## 2023-05-02 DIAGNOSIS — C3490 Malignant neoplasm of unspecified part of unspecified bronchus or lung: Secondary | ICD-10-CM

## 2023-05-02 DIAGNOSIS — I2510 Atherosclerotic heart disease of native coronary artery without angina pectoris: Secondary | ICD-10-CM

## 2023-05-02 DIAGNOSIS — I5022 Chronic systolic (congestive) heart failure: Secondary | ICD-10-CM | POA: Insufficient documentation

## 2023-05-02 DIAGNOSIS — N280 Ischemia and infarction of kidney: Secondary | ICD-10-CM | POA: Diagnosis present

## 2023-05-02 DIAGNOSIS — J449 Chronic obstructive pulmonary disease, unspecified: Secondary | ICD-10-CM | POA: Insufficient documentation

## 2023-05-02 LAB — CBC
HCT: 43.9 % (ref 36.0–46.0)
Hemoglobin: 13.9 g/dL (ref 12.0–15.0)
MCH: 28.3 pg (ref 26.0–34.0)
MCHC: 31.7 g/dL (ref 30.0–36.0)
MCV: 89.2 fL (ref 80.0–100.0)
Platelets: 344 10*3/uL (ref 150–400)
RBC: 4.92 MIL/uL (ref 3.87–5.11)
RDW: 20.6 % — ABNORMAL HIGH (ref 11.5–15.5)
WBC: 12.5 10*3/uL — ABNORMAL HIGH (ref 4.0–10.5)
nRBC: 0 % (ref 0.0–0.2)

## 2023-05-02 LAB — BASIC METABOLIC PANEL
Anion gap: 12 (ref 5–15)
BUN: 27 mg/dL — ABNORMAL HIGH (ref 8–23)
CO2: 28 mmol/L (ref 22–32)
Calcium: 9.7 mg/dL (ref 8.9–10.3)
Chloride: 97 mmol/L — ABNORMAL LOW (ref 98–111)
Creatinine, Ser: 1.95 mg/dL — ABNORMAL HIGH (ref 0.44–1.00)
GFR, Estimated: 29 mL/min — ABNORMAL LOW (ref >60)
Glucose, Bld: 119 mg/dL — ABNORMAL HIGH (ref 70–99)
Potassium: 3.8 mmol/L (ref 3.5–5.1)
Sodium: 137 mmol/L (ref 135–145)

## 2023-05-02 LAB — BRAIN NATRIURETIC PEPTIDE: B Natriuretic Peptide: 1263.9 pg/mL — ABNORMAL HIGH (ref 0.0–100.0)

## 2023-05-02 LAB — DIGOXIN LEVEL: Digoxin Level: 0.5 ng/mL — ABNORMAL LOW (ref 0.8–2.0)

## 2023-05-02 MED ORDER — SPIRONOLACTONE 25 MG PO TABS: 12.5000 mg | ORAL_TABLET | Freq: Every day | ORAL | 3 refills | Status: DC

## 2023-05-02 MED ORDER — IOHEXOL 350 MG/ML SOLN
40.0000 mL | Freq: Once | INTRAVENOUS | Status: AC | PRN
  Administered 2023-05-02: 40 mL via INTRAVENOUS

## 2023-05-02 NOTE — Patient Instructions (Signed)
INCREASE Torsemide to 80 mg daily for 3 days then resume normal dose of 60 mg daily there after RESTART Spironolactone 12.5 mg nightly at bedtime  Labs today We will only contact you if something comes back abnormal or we need to make some changes. Otherwise no news is good news!  Labs needed in 1 week  Your physician recommends that you schedule a follow-up appointment in: 4 weeks  in the [facility] Pract[facility] ([facility]/NP[facility]   Do the following things EVERYDAY: Weigh yourself in the morning before breakfast. Write it down and keep it in a log. Take your medicines as prescribed Eat low salt foods--Limit salt (sodium) to 2000 mg per day.  Stay as active as you can everyday Limit all fluids for the day to less than 2 liters

## 2023-05-02 NOTE — Progress Notes (Signed)
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 Underwent emergent cath 02/03/18 showed LAD 100% stenosed, LCx 95% stenosed. Taken for emergent CABG 02/03/18. Required impella post op. Hospital course complicated by cardiogenic shock, HCAP, A fib, respiratory failure, and swallowing issues. She was discharged to SNF. Discharge weight 103 pounds.    In 2019 had multiple hospitalizations for HF and pleural effusion. Underwent pleurodesis at [facility].   Admitted 3/20 with NSTEMI and HF. Received DES to ostial ramus into distal left main based on cath below and underwent diuresis. Meds adjusted as tolerated. Echo with EF 25-30%.   Admitted 11/21 with COPD flare with mild HF and hemoptysis. CTA showed stable scarring in the right hilum consistent with emphysema.. Underwent bronchoscopy on 11/15, no obvious source of bleeding found on examination.   Echo 11/21 EF 20-25%. RV mildly HK.   Admitted 3/22 Echo with EF < 20%. Treated for AECOPD exacerbation.   Admitted 12/22 with R renal infarct felt to be cardio-embolic (no AF found). Started on Eliquis  Admitted 8/23 w/ a/c respiratory failure and CHF. Concern for low output vs PNA vs COPD exacerbation. PICC placed and milrinone started. Diuresed w/ IV Lasix. Given prednisone taper and doxy for COPD exacerbation. Echo showed EF 20-25%, global hypokinesis, GIIDD, RV systolic function, mildly reduced. RV size normal. Mild MR, trivial TR, aortic valve w/ mild-mod regurg.  Barostim placed.   Admitted 12/23 with COPDE, a/c CHF and  influenza +. Placed on BiPap, treated with tamiflu and diuresed with IV lasix. Empirically started milrinone over concern for low output  She was seen 3/24 in the HF clinic. Volume overloaded. Instructed to take torsemide 80 mg daily and take metolazone 3/27 and 03/09/23.  Follow up 4/24, NYHA III-IIIb, volume OK, ReDs 35%. Referred to paramedicine and Pulmonology.  Admitted 5/24 with N/V. CT ok, wet prep + trich, treated with IV Flagyl.  HsTroponin elevated. Cards consulted felt elevated troponin 2/2 demand ischemia. Echo showed EF 20-25%, LV with global HK, RV mildly reduced, moderate AI. [name] and KCL stopped with AKI and she was discharged home, weight 101 lbs.  Today she returns for post hospital HF follow up. Overall feeling fine, breathing has greatly improved, she attributes this to initiation ofTrelegy. Having some dizziness, she attributes this to her digoxin. No true chest pain, but some occasional chest heaviness. Denies palpitations, abnormal bleeding, edema, or PND/Orthopnea. Appetite ok. No fever or chills. Weight at home 96 pounds. Taking all medications. Has not had metolazone in awhile.  Cardidac Studies: - Echo (5/24): EF 20-25%, LV with global HK, RV mildly reduced, moderate AI.  - Echo (7/23): EF 20-25%, global hypokinesis, GIIDD, RV systolic function, mildly reduced. RV size normal. Mild MR, trivial TR, aortic valve w/ mild-mod regurg  - Echo 1/23: EF 20-25%, severely reduced LV with global HK, grade II DD, normal RV  - Echo 02/10/21: EF <20%, G2DD  - Echo 2022: EF < 20% RV normal   - Echo 2021: EF 20-25% RV mildly reduced.   - Echo 02/12/19: EF 25-30%, RV mildly  reduced  - 05/18/2021 PFT's: moderately severe airway obstruction and a diffusion defect suggesting emphysema.   - LHC 02/12/19: Ost LAD to Prox LAD lesion is 100% stenosed. Ost Cx to Prox Cx lesion is 100% stenosed. Ost Ramus lesion is 99% stenosed. Post intervention, there is a 0% residual stenosis. A  drug-eluting stent was successfully placed using a STENT RESOLUTE ONYX 2.25X15. SVG and is normal in caliber. The graft exhibits no disease. Ost 1st Diag lesion is 70% stenosed. SVG and is normal in caliber. Prox Graft lesion is 100% stenosed. 1.  Significant underlying two-vessel coronary artery disease with patent SVG to OM and SVG to diagonal which supplies the LAD territory.  Atretic LIMA to LAD given competitive flow from SVG to diagonal.  Severe ostial ramus artery stenosis not supplied by bypass with his area suggestive of plaque rupture. 2.  Severely elevated left ventricular end-diastolic pressure 34 mmHg.  Left ventricular angiography was not performed. 3.  Successful angioplasty and drug-eluting stent placement to the ostial ramus extending into the distal left main.  - RHC 2019  RA = 8 RV = 49/9 PA = 47/20 (32) PCW = 19 Fick cardiac output/index = 4.3/3.1 PVR = 3.0 wu AO sat = 93% PA sat = 62%, 64%   1. Minimally elevated left heart pressures 2. Mild PAH 3. Normal cardiac output  Past Medical History:  Diagnosis Date   Acute respiratory failure (HCC) 05/05/2018   Acute systolic congestive heart failure (HCC) 02/03/2018   AICD (automatic cardioverter/defibrillator) present    Allergy    Anxiety    Asthma    DM2 (diabetes mellitus, type 2) (HCC) 10/20/2020   Hypertension    PAF (paroxysmal atrial fibrillation) (HCC)    Presence of permanent cardiac pacemaker    Prophylactic measure 08/03/14-08/19/14   Prophyl. 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   LIMA to LAD, SVG to D1, SVG to OM1, EVH via right thigh with implantation of Impella LD LVAD via direct aortic approach   Small cell lung cancer (HCC) 03/16/2014   Past Surgical History:  Procedure Laterality Date   BRONCHIAL BRUSHINGS  10/25/2020   Procedure: BRONCHIAL BRUSHINGS;  Surgeon: Leslye Peer, MD;  Location: [facility] ENDOSCOPY;  Service: Pulmonary;;   BRONCHIAL NEEDLE ASPIRATION BIOPSY   10/25/2020   Procedure: BRONCHIAL NEEDLE ASPIRATION BIOPSIES;  Surgeon: Leslye Peer, MD;  Location: [facility] ENDOSCOPY;  Service: Pulmonary;;   CARDIAC DEFIBRILLATOR PLACEMENT  08/15/2018   MDT Visia AF MRI VR ICD implanted by Dr Georgena Spurling for primary prevention of sudden   CESAREAN SECTION     CORONARY ARTERY BYPASS GRAFT N/A 02/03/2018   Procedure: CORONARY ARTERY BYPASS GRAFTING (CABG);  Surgeon: Purcell Nails, MD;  Location: [facility] OR;  Service: Open Heart Surgery;  Laterality: N/A;  Time 3 using left internal mammary artery and endoscopically harvested right saphenous vein   CORONARY BALLOON ANGIOPLASTY N/A 02/03/2018   Procedure: CORONARY BALLOON ANGIOPLASTY;  Surgeon: Swaziland, Peter M, MD;  Location: [facility] INVASIVE CV LAB;  Service: Cardiovascular;  Laterality: N/A;   CORONARY STENT INTERVENTION N/A 02/12/2019   Procedure: CORONARY STENT INTERVENTION;  Surgeon: Iran Ouch, MD;  Location: [facility] INVASIVE CV LAB;  Service: Cardiovascular;  Laterality: N/A;   CORONARY/GRAFT ACUTE MI REVASCULARIZATION N/A 02/03/2018   Procedure: Coronary/Graft Acute MI Revascularization;  Surgeon: Swaziland, Peter M, MD;  Location: [facility] INVASIVE CV LAB;  Service: Cardiovascular;  Laterality: N/A;   ENDOBRONCHIAL ULTRASOUND N/A 10/25/2020   Procedure:  ENDOBRONCHIAL ULTRASOUND;  Surgeon: Leslye Peer, MD;  Location: [facility] ENDOSCOPY;  Service: Pulmonary;  Laterality: N/A;   FLEXIBLE BRONCHOSCOPY  10/25/2020   Procedure: FLEXIBLE BRONCHOSCOPY;  Surgeon: Leslye Peer, MD;  Location: [facility] ENDOSCOPY;  Service: Pulmonary;;   IABP INSERTION N/A 02/03/2018   Procedure: IABP Insertion;  Surgeon: Swaziland, Peter M, MD;  Location: [facility] INVASIVE CV LAB;  Service: Cardiovascular;  Laterality: N/A;   INTRAOPERATIVE TRANSESOPHAGEAL ECHOCARDIOGRAM N/A 02/03/2018   Procedure: INTRAOPERATIVE TRANSESOPHAGEAL ECHOCARDIOGRAM;  Surgeon: Purcell Nails, MD;  Location: [facility] OR;  Service: Open Heart Surgery;  Laterality: N/A;   LEFT HEART CATH AND CORONARY  ANGIOGRAPHY N/A 02/03/2018   Procedure: LEFT HEART CATH AND CORONARY ANGIOGRAPHY;  Surgeon: Swaziland, Peter M, MD;  Location: [facility] INVASIVE CV LAB;  Service: Cardiovascular;  Laterality: N/A;   LEFT HEART CATH AND CORS/GRAFTS ANGIOGRAPHY N/A 02/12/2019   Procedure: LEFT HEART CATH AND CORS/GRAFTS ANGIOGRAPHY;  Surgeon: Iran Ouch, MD;  Location: [facility] INVASIVE CV LAB;  Service: Cardiovascular;  Laterality: N/A;   MEDIASTINOSCOPY N/A 03/11/2014   Procedure: MEDIASTINOSCOPY;  Surgeon: Loreli Slot, MD;  Location: [facility] OR;  Service: Thoracic;  Laterality: N/A;   PLACEMENT OF IMPELLA LEFT VENTRICULAR ASSIST DEVICE  02/03/2018   Procedure: PLACEMENT OF IMPELLA LEFT VENTRICULAR ASSIST DEVICE LD;  Surgeon: Purcell Nails, MD;  Location: [facility] OR;  Service: Open Heart Surgery;;   REMOVAL OF IMPELLA LEFT VENTRICULAR ASSIST DEVICE N/A 02/08/2018   Procedure: REMOVAL OF IMPELLA LEFT VENTRICULAR ASSIST DEVICE;  Surgeon: Purcell Nails, MD;  Location: [facility] OR;  Service: Open Heart Surgery;  Laterality: N/A;   RIGHT HEART CATH N/A 02/03/2018   Procedure: RIGHT HEART CATH;  Surgeon: Swaziland, Peter M, MD;  Location: [facility] INVASIVE CV LAB;  Service: Cardiovascular;  Laterality: N/A;   RIGHT HEART CATH N/A 05/09/2018   Procedure: RIGHT HEART CATH;  Surgeon: Dolores Patty, MD;  Location: [facility] INVASIVE CV LAB;  Service: Cardiovascular;  Laterality: N/A;   TEE WITHOUT CARDIOVERSION N/A 02/08/2018   Procedure: TRANSESOPHAGEAL ECHOCARDIOGRAM (TEE);  Surgeon: Purcell Nails, MD;  Location: [facility] OR;  Service: Open Heart Surgery;  Laterality: N/A;   TUBAL LIGATION     VIDEO BRONCHOSCOPY WITH ENDOBRONCHIAL ULTRASOUND N/A 03/11/2014   Procedure: VIDEO BRONCHOSCOPY WITH ENDOBRONCHIAL ULTRASOUND;  Surgeon: Loreli Slot, MD;  Location: [facility] OR;  Service: Thoracic;  Laterality: N/A;   Current Outpatient Medications  Medication Sig Dispense Refill   albuterol (VENTOLIN HFA) 108 (90 Base) MCG/ACT inhaler Inhale 2 puffs into the  lungs every 6 (six) hours as needed for wheezing or shortness of breath. 8.5 g 1   apixaban (ELIQUIS) 2.5 MG TABS tablet Take 1 tablet (2.5 mg total) by mouth 2 (two) times daily. 180 tablet 1   atorvastatin (LIPITOR) 80 MG tablet Take 1 tablet (80 mg total) by mouth daily. 90 tablet 0   blood glucose meter kit and supplies KIT Dispense based on patient and insurance preference. Use up to four times daily as directed. 1 each 0   dapagliflozin propanediol (FARXIGA) 10 MG TABS tablet Take 1 tablet by mouth daily. 30 tablet 11   digoxin (LANOXIN) 0.125 MG tablet TAKE 1 TABLET BY MOUTH DAILY 90 tablet 3   Fluticasone-Umeclidin-Vilant (TRELEGY ELLIPTA) 200-62.5-25 MCG/ACT AEPB Inhale 1 puff into the lungs daily. 60 each 5   ipratropium-albuterol (DUONEB) 0.5-2.5 (3) MG/3ML SOLN Take 3 mLs by nebulization every 4 (four) hours as needed (for shortness of breath). 90 mL 3  ivabradine (CORLANOR) 7.5 MG TABS tablet Take 1 tablet (7.5 mg total) by mouth 2 (two) times daily with a meal. TAKE 1 TABLET (7.5 MG TOTAL) BY MOUTH 2 (TWO) TIMES DAILY WITH A MEAL. 180 tablet 1   metolazone (ZAROXOLYN) 2.5 MG tablet Take 1 tablet (2.5 mg total) by mouth as directed. 15 tablet 1   naphazoline-pheniramine (VISINE) 0.025-0.3 % ophthalmic solution Place 1 drop into both eyes 4 (four) times daily as needed for eye irritation.     nitroGLYCERIN (NITROSTAT) 0.4 MG SL tablet Place 1 tablet (0.4 mg total) under the tongue every 5 (five) minutes as needed for chest pain. 90 tablet 5   Nutritional Supplements (ENSURE ORIGINAL) LIQD Take 2 Bottles by mouth daily.     torsemide (DEMADEX) 20 MG tablet Take 60 mg by mouth daily.     No current facility-administered medications for this encounter.    Allergies:   Lactose intolerance (gi) and Codeine   Social History:  The patient  reports that she quit smoking about 9 years ago. Her smoking use included cigarettes. She has a 20.00 pack-year smoking history. She has never used  smokeless tobacco. She reports current alcohol use of about 5.0 standard drinks of alcohol per week. She reports current drug use. Drug: Marijuana.   Family History:  The patient's family history includes Cancer in her maternal grandmother; Diabetes in her paternal grandmother; Heart attack in her mother; Heart disease in her mother; Hypertension in her maternal grandmother and mother.   ROS:  Please see the history of present illness.   All other systems are personally reviewed and negative.    Recent Labs: 04/18/2023: ALT 28; B Natriuretic Peptide 1,203.6 04/19/2023: TSH 0.640 04/21/2023: Magnesium 2.2 04/22/2023: BUN 28; Creatinine, Ser 1.38; Hemoglobin 12.4; Platelets 169; Potassium 5.3; Sodium 133  Personally reviewed   Wt Readings from Last 3 Encounters:  05/02/23 44.8 kg (98 lb 12.8 oz)  05/01/23 43.5 kg (96 lb)  04/22/23 46.1 kg (101 lb 10.1 oz)   BP 100/60   Pulse 80   Wt 44.8 kg (98 lb 12.8 oz)   SpO2 96%   BMI 19.96 kg/m  Physical Exam General:  NAD. No resp difficulty, walked into clinic HEENT: Normal Neck: Supple. No JVD. Carotids 2+ bilat; no bruits. No lymphadenopathy or thryomegaly appreciated. Cor: PMI nondisplaced. Regular rate & rhythm. No rubs, gallops or murmurs. Lungs: Clear, coarse BS LLL Abdomen: Soft, nontender, nondistended. No hepatosplenomegaly. No bruits or masses. Good bowel sounds. Extremities: No cyanosis, clubbing, rash, edema Neuro: Alert & oriented x 3, cranial nerves grossly intact. Moves all 4 extremities w/o difficulty. Affect pleasant.  Device interrogation (personally reviewed): OptiVol up, thoracic impedence down, 1.3 hr/day activity, no AF or VT100% v-sensed  ECG (personally reviewed): a-paced, occasional BiV pacing, barostim artifact  Assessment & Plan: 1. Chronic Systolic Heart Failure, due to ICM  - Echo 09/04/18 ([facility]) LVEF 20%, Moderate AI, Mild MR, Mild TR, Severe LAE, RV mildly decreased.  - Echo 02/12/19: EF 25-30%, RV mildly  reduced - Echo 11/21 EF 20-25% RV mild HK. - Echo 2022 EF < 20% RV normal - Echo 1/23 EF 20-25% RV ok  - Has Med[employer]onic single chamber ICD  - Echo 07/10/22: EF 20-25%, global hypokinesis, GIIDD, RV systolic function, mildly reduced. RV size normal. Mild MR, trivial TR, aortic valve w/ mild-mod regurg.  - Echo (5/24): EF 20-25%, LV with global HK, RV mildly reduced, moderate AI. - NYHA II-early III, volume up by OptiVol. GDMT recently  pulled back due to AKI and hyperkalemia. - Restart spiro 12.5 mg qhs. - Increase torsemide to 80 mg daily x 3 days, then back to 60 mg daily. - Continue digoxin 0.125 mg daily.  - Continue Farxiga 10 mg daily.  - Continue Corlanor 7.5 mg bid. - Failed Entresto due to hyperkalemia.   - No b-blocker due to intolerance with low output and bad COPD with frequent flares. - Not a candidate for advanced therapies with her degree of lung disease.   - Now with Barostim in place.  - BMET, BNP and dig level today; repeat BMET in 1 week.   2. Chronic Hypoxic Respiratory Failure with COPD - Multifactorial, former smoker, h/o small cell lung cancer treated with chemo, chest XRT, COPD and HF - She has home O2 (Adapt) & BiPap. - Continue Trelegy and nebs - She is now followed by Pulmonary  3. CAD  - Hx of NSTEMI/STEMI s/p Emergent CABG 02/03/18: Cath with severe 2v CAD as above with severe LV dysfunction/ICM.  - s/p Emergent CABG 02/03/18.   - Had NSTEMI and now s/p LHC 02/12/19 with DES to the ostial ramus extending into the distal left main.  - No longer on brillinta or ASA due to bleeding, and need for [facility]. - No chest pain.  - Continue hi-intensity statin.   4. COPD w/ current exacerbation - Recent exacerbations with + influenza a.  - Continue Trelegy. - Followed by Pulmonary.   5. Recurrent pleural effusions s/p pleurodesis - Resolved.  - Recent CXR ok   6. PAF - CHA2DS2/VASc is at least 4. (CHF, Vasc disease, HTN, Female).   - She has history of Afib RVR in the  past in the setting of her Lung CA.  - Previously on Xarelto but stopped due to hemoptysis.  - Started on Eliquis in 12/22 due to renal infarct. - NSR on ECG today. - Continue Eliquis 2.5 mg bid. No bleeding issues. - CBC today.  7. 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 - Remains on Eliquis.     8. H/o SCLC:  - s/p treatment 2015. Lost to f/u since 04/2015.  - Chest CT 02/19/18 with mass-like consolidation in R hilum concerning for recurrent tumor.  - Repeat CT 03/27/2018 -No definite findings of locally recurrent tumor in the right lung. 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 Continued chest CT surveillance is advised in 3-6 months. - S/p thoracentesis 04/2018. Cytology negative for malignancy.  - CTA 11/19 stable scarring in the right hilum consistent with emphysema. Bronchoscopy on 11/15, no obvious source of bleeding  Follow up in 4 weeks with APP for fluid check. Followed by paramedicine, appreciate their assistance.  Jacklynn Ganong, FNP  12:14 PM

## 2023-05-03 ENCOUNTER — Telehealth (HOSPITAL_COMMUNITY): Payer: Self-pay | Admitting: Emergency Medicine

## 2023-05-03 NOTE — Telephone Encounter (Signed)
Called and spoke with Ms. Washington to confirm that she is aware of med changes  She was able to tell me that she is restarting Spironolactone 12.5.mg daily and increase her Torsemide to 80mg  for 3 days and then go back to 60mg  daily.  She started this today 5/23 and will drop back to the 60mg . Torsemide on Sunday.  These are the correct changes for her.    [name], EMT-Paramedic [phone] 05/03/2023

## 2023-05-08 ENCOUNTER — Telehealth: Payer: Self-pay | Admitting: Pulmonary Disease

## 2023-05-08 ENCOUNTER — Ambulatory Visit: Payer: BC Managed Care – PPO | Attending: Cardiology

## 2023-05-08 ENCOUNTER — Telehealth (HOSPITAL_COMMUNITY): Payer: Self-pay | Admitting: Emergency Medicine

## 2023-05-08 DIAGNOSIS — Z95810 Presence of automatic (implantable) cardiac defibrillator: Secondary | ICD-10-CM

## 2023-05-08 DIAGNOSIS — I5022 Chronic systolic (congestive) heart failure: Secondary | ICD-10-CM

## 2023-05-08 NOTE — Telephone Encounter (Signed)
Rubi calling with call report. For CT scan. Gregary Signs number is [phone].

## 2023-05-08 NOTE — Telephone Encounter (Signed)
Spoke with [facility]. She was calling about the CT chest scan that was done on 05/22. Below is a copy of the impression:  "  IMPRESSION: Stable soft tissue thickening and distortion right perihilar with some bronchiectasis. There is some ill-defined soft tissue thickening along the mediastinum on the right side as well extending to the left AP window but no discrete mass. Would recommend short follow-up in 3 months or a follow up PET-CT if there is more clinical concern.   Postop chest with defibrillator and enlarged heart.   Ill-defined area of enhancement involving the upper pole of the left kidney anteriorly. Possibilities include an area of poor enhancement versus an infiltrative mass. Would recommend further workup with contrast examination of the abdomen and pelvis for the kidneys.   Aortic Atherosclerosis (ICD10-I70.0).   Findings will be called to the ordering service by the Radiology physician team on the next business day     Electronically Signed   By: Karen Kays M.D.   On: 05/07/2023 13:51"  AO, can you please advise? Thanks!

## 2023-05-08 NOTE — Telephone Encounter (Signed)
Ms. Barnabas Lister called to advise she would need to cancel her appointment today due to being out of town.  I advised her that she has an appointment at the clinic tomorrow and I will see her then and she agreed to same.    [name], EMT-Paramedic [phone] 05/08/2023

## 2023-05-09 ENCOUNTER — Other Ambulatory Visit (HOSPITAL_COMMUNITY): Payer: BC Managed Care – PPO

## 2023-05-09 ENCOUNTER — Telehealth (HOSPITAL_COMMUNITY): Payer: Self-pay | Admitting: Emergency Medicine

## 2023-05-09 ENCOUNTER — Telehealth: Payer: Self-pay | Admitting: Pulmonary Disease

## 2023-05-09 DIAGNOSIS — R93422 Abnormal radiologic findings on diagnostic imaging of left kidney: Secondary | ICD-10-CM

## 2023-05-09 DIAGNOSIS — C3490 Malignant neoplasm of unspecified part of unspecified bronchus or lung: Secondary | ICD-10-CM

## 2023-05-09 NOTE — Progress Notes (Signed)
Spoke with patient and advised [name], NP at HF clinic recommended to take PRN Metolazone 1 tablet with 40 mEq potassium.  She has potassium at home from previous prescription and will take the 40 recommended.  Advised to take the Metolazone with potassium 30 minutes prior to taking Torsemide dosage tomorrow morning.  Advised to call back if any questions or any changes in condition.  She verbalized understanding of instructions and repeated back correctly.

## 2023-05-09 NOTE — Progress Notes (Signed)
EPIC Encounter for ICM Monitoring  Patient Name: Brittany [name] is a 62 y.o. female Date: 05/09/2023 Primary Care Physican: Pcp, No Primary Cardiologist: Bensimhon Electrophysiologist: Lalla Brothers 11/28/2022 Weight: 103 lbs 12/20/2022 Weight: 100 lbs 02/13/2023 Weight: 100-102 lbs 03/20/2023 Weight: 100 lbs   Spoke with patient and heart failure questions reviewed.  Transmission results reviewed.  Pt feels a little tightness around the chest area which is usually her symptom of fluid.   Optivol Thoracic impedance suggesting possible fluid accumulation starting 5/9.   Instructed 5/22 at HF clinic OV to increase Torsemide to 80 mg x 3 days and then resume at 60 mg daily.  Fluid index greater than normal threshold starting 5/8.  Barostim implant.   Prescribed: Torsemide 20 mg take 3 tablets (60 mg total) by mouth daily. Spironolactone 25 mg take 0.5 tablet (12.5 mg total) by mouth at bedtime Metolazone 2.5 mg take 1 tablet by mouth as needed   Labs: 05/14/2023 BMET scheduled 05/02/2023 Creatinine 1.95, BUN 27, Potassium 3.8, Sodium 137, GFR 29 04/22/2023 Creatinine 1.38, BUN 28, Potassium 5.3, Sodium 133, GFR 43  04/21/2023 Creatinine 1.51, BUN 26, Potassium 4.9, Sodium 133, GFR 39  04/20/2023 Creatinine 1.63, BUN 27, Potassium 4.5, Sodium 132, GFR 35  04/19/2023 Creatinine 1.63, BUN 28, Potassium 4.3, Sodium 133, GFR 35 04/18/2023 Creatinine 1.46, BUN 27, Potassium 5.3, Sodium 135, GFR 40  03/15/2023 Creatinine 1.14, BUN 36, Potassium 3.3, Sodium 140, GFR 54 03/07/2023 Creatinine 1.29, BUN 34, Potassium 4.5, Sodium 136, GFR 47 03/03/2023 Creatinine 1.31, BUN 34, Potassium 3.8, Sodium 134, GFR 46 01/29/2023 Creatinine 1.14, BUN 21, Potassium 4.1, Sodium 136, GFR 54 01/15/2023 Creatinine 1.37, BUN 19, Potassium 3.4, Sodium 135, GFR 44 12/19/2022 Creatinine 1.28, BUN 14, Potassium 3.4, Sodium 138, GFR 47 (resulting in an increase in K+ dosage) A complete set of results can be found in  Results Review.   Recommendations:  Copy sent to [name], NP at [facility] clinic for review and recommendations.     Follow-up plan: ICM clinic phone appointment on 05/15/2023 to recheck fluid levels.   91 day device clinic remote transmission 06/20/2023.     EP/Cardiology Office Visits:  05/28/2023 with HF clinic.      Copy of ICM check sent to Dr. Lalla Brothers.   3 month ICM trend: 05/08/2023.    12-14 Month ICM trend:     Karie Soda, RN 05/09/2023 3:08 PM

## 2023-05-09 NOTE — Telephone Encounter (Signed)
Ms [place] text me to advise she had issues with her transportation today and is going to cancel her clinic appointment for today.  I advised her to reschedule another appoinment when she calls to cancel today's.    [name], EMT-Paramedic [phone] 05/09/2023

## 2023-05-09 NOTE — Progress Notes (Signed)
  Received: Today [name], Anderson Malta, FNP  Adrianah Prophete, Josephine Igo, RN Please take metolazone 2.5 mg + 40 KCL x 1

## 2023-05-09 NOTE — Telephone Encounter (Signed)
Called Brittany [name]  about CAT her scan report   Findings in the lung may be related to history of small cell lung cancer that she was treated for in the past, there is no discrete mass -We will obtain a CT scan in 3 months to follow-up  Findings on the kidney not fully delineated on current study, had CT scan of the abdomen 5/ 8 that was noncontrast -I did speak with Dr. Chales Abrahams about recent CT and he recommends abdominal CT with contrast   Please order  1.  CT scan of the abdomen/ pelvis-for left kidney abnormal enhancement-to be scheduled now  2.  CT scan of the chest with contrast in 3 months-history of small cell lung cancer/follow-up

## 2023-05-10 ENCOUNTER — Telehealth (HOSPITAL_COMMUNITY): Payer: Self-pay | Admitting: Licensed Clinical Social Worker

## 2023-05-10 NOTE — Telephone Encounter (Signed)
H&V Care Navigation CSW Progress Note  Clinical Social Worker informed by paramedic that pt needing help getting PCP.  CSW called pt who is agreeable to this.  She is close by Primary Care at [facility] and is agreeable to getting appt there.  CSW reached out to case manager with [facility] to assist in scheduling appt- awaiting appt time and will update patient   SDOH Screenings   Food Insecurity: No Food Insecurity (04/19/2023)  Housing: Low Risk  (04/19/2023)  Transportation Needs: No Transportation Needs (04/19/2023)  Utilities: Not At Risk (04/19/2023)  Alcohol Screen: Low Risk  (02/11/2021)  Depression (PHQ2-9): Low Risk  (02/11/2021)  Financial Resource Strain: Medium Risk (02/11/2021)  Physical Activity: Insufficiently Active (02/11/2021)  Social Connections: Unknown (02/11/2021)  Stress: Stress Concern Present (02/11/2021)  Tobacco Use: Medium Risk (05/02/2023)   Burna Sis, LCSW Clinical Social Worker [facility] Desk#: [phone] Cell#: [phone]

## 2023-05-11 ENCOUNTER — Telehealth: Payer: Self-pay

## 2023-05-11 NOTE — Telephone Encounter (Signed)
[name], Anderson Malta, FNP  Kapena Hamme, Josephine Igo, RN Caller: Unspecified (Today,  1:15 PM) Oh I am so very sorry to heart about this Camie. Recent labs showed a little worsening of kidney function, but could be related to volume.  Would recommend no more metolazone until we get repeat labs on 05/14/23 and can then make further recs. Would stay on current doses of spiro and lasix until labs as well. I will call [name] to check in on her

## 2023-05-11 NOTE — Addendum Note (Signed)
Addended by: Wyvonne Lenz on: 05/11/2023 03:30 PM   Modules accepted: Orders

## 2023-05-11 NOTE — Telephone Encounter (Signed)
Orders have been placed.  Nothing further needed.

## 2023-05-11 NOTE — Telephone Encounter (Signed)
Spoke with patient.  She stated Brittany [name] has already called and provided recommendations to hold Metolazone until 6/3 labs are completed and then will provide further recommendations.  She appreciated the quick response from [facility] which helps with the anxiety.  She will contact the Pulmonologist office to get the name of CT scan facility so she can all and make an appointment.  She does not want to wait for the facility to call her.  Advised to call back for any further questions.

## 2023-05-11 NOTE — Telephone Encounter (Signed)
Received call from patient and reports the pulmonologist has discovered a kidney mass.  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  The pulmonologist is ordering a CT scan with contrast of the abdomen and pelvis and she is waiting on a call to schedule the test.   She would like advice from HF clinic on the following:  Will the kidney mass and current diuretics, Torsemide, Spironolactone and PRN Metolazone cause the kidney function to worsen? Are there any precautions she should be aware of before having a CT Scan with contrast given her current status of kidney function.     Pt is feeling anxious regarding the unknown and waiting for the scan to be scheduled.    Brittany [name], patient asked for your input on her concerns.  Will you please advise.  Thank you.

## 2023-05-14 ENCOUNTER — Encounter: Payer: Self-pay | Admitting: Internal Medicine

## 2023-05-14 ENCOUNTER — Telehealth (HOSPITAL_COMMUNITY): Payer: Self-pay | Admitting: Licensed Clinical Social Worker

## 2023-05-14 ENCOUNTER — Ambulatory Visit (HOSPITAL_COMMUNITY)
Admission: RE | Admit: 2023-05-14 | Discharge: 2023-05-14 | Disposition: A | Discharge status: Home / Self Care | Payer: BC Managed Care – PPO | Source: Ambulatory Visit | Attending: Cardiology | Admitting: Cardiology

## 2023-05-14 DIAGNOSIS — I5022 Chronic systolic (congestive) heart failure: Secondary | ICD-10-CM

## 2023-05-14 LAB — BASIC METABOLIC PANEL
Anion gap: 11 (ref 5–15)
BUN: 21 mg/dL (ref 8–23)
CO2: 27 mmol/L (ref 22–32)
Calcium: 9.4 mg/dL (ref 8.9–10.3)
Chloride: 99 mmol/L (ref 98–111)
Creatinine, Ser: 1.46 mg/dL — ABNORMAL HIGH (ref 0.44–1.00)
GFR, Estimated: 40 mL/min — ABNORMAL LOW (ref >60)
Glucose, Bld: 152 mg/dL — ABNORMAL HIGH (ref 70–99)
Potassium: 3.6 mmol/L (ref 3.5–5.1)
Sodium: 137 mmol/L (ref 135–145)

## 2023-05-14 NOTE — Telephone Encounter (Signed)
RNCM with [facility] assisted in making appt for pt with Primary Care at [facility] on June 19th- CSW called pt and informed  Burna Sis, LCSW Clinical Social Worker [facility] Desk#: [phone] Cell#: [phone]

## 2023-05-15 ENCOUNTER — Telehealth (HOSPITAL_COMMUNITY): Payer: Self-pay | Admitting: Emergency Medicine

## 2023-05-15 ENCOUNTER — Ambulatory Visit: Payer: BC Managed Care – PPO | Attending: Cardiology

## 2023-05-15 DIAGNOSIS — I5022 Chronic systolic (congestive) heart failure: Secondary | ICD-10-CM

## 2023-05-15 DIAGNOSIS — Z95810 Presence of automatic (implantable) cardiac defibrillator: Secondary | ICD-10-CM

## 2023-05-15 NOTE — Progress Notes (Signed)
Spoke with patient and advised [name], NP at HF clinic recommended she take Metolazone 2.5 mg 1 tablet today with potassium 40 mEq and repeat the dosage tomorrow.   She should go to ER if symptoms do no improve during the next 2 days.  Pt has recently discontinued potassium prescription and will take 40 mEq with each Metolazone dosage.  She verbalizes understanding and agrees to plan.  BMET scheduled for 6/12.

## 2023-05-15 NOTE — Progress Notes (Signed)
EPIC Encounter for ICM Monitoring  Patient Name: Brittany [name] is a 62 y.o. female Date: 05/15/2023 Primary Care Physican: Pcp, No Primary Cardiologist: Bensimhon Electrophysiologist: Lalla Brothers 11/28/2022 Weight: 103 lbs 12/20/2022 Weight: 100 lbs 02/13/2023 Weight: 100-102 lbs 03/20/2023 Weight: 100 lbs   Spoke with patient and heart failure questions reviewed.  Transmission results reviewed.  Pt had to sit up to breathe last night, feels chest heaviness and SOB.  She has a crackling in the chest and not feeling well at all today.  She has pain in lower back and stomach.  She is still concerned about the kidney function since she has new dx of kidney mass.     Optivol Thoracic impedance suggesting fluid levels show slight improvement after taking extra Torsemide on 5/31 but taking another downward turn to suggest fluid accumulation worsening again.  She did not take any metolazone because she has a new dx of kidney mass and thinks the Metolazone may worsen kidney function.     Prescribed: Torsemide 20 mg take 3 tablets (60 mg total) by mouth daily. Spironolactone 25 mg take 0.5 tablet (12.5 mg total) by mouth at bedtime Metolazone 2.5 mg take 1 tablet by mouth as needed   Labs: 05/14/2023 Creatinine 1.46, BUN 21, Potassium 3.6, Sodium 137, GFR 40 05/02/2023 Creatinine 1.95, BUN 27, Potassium 3.8, Sodium 137, GFR 29 04/22/2023 Creatinine 1.38, BUN 28, Potassium 5.3, Sodium 133, GFR 43  04/21/2023 Creatinine 1.51, BUN 26, Potassium 4.9, Sodium 133, GFR 39  04/20/2023 Creatinine 1.63, BUN 27, Potassium 4.5, Sodium 132, GFR 35  04/19/2023 Creatinine 1.63, BUN 28, Potassium 4.3, Sodium 133, GFR 35 04/18/2023 Creatinine 1.46, BUN 27, Potassium 5.3, Sodium 135, GFR 40  03/15/2023 Creatinine 1.14, BUN 36, Potassium 3.3, Sodium 140, GFR 54 03/07/2023 Creatinine 1.29, BUN 34, Potassium 4.5, Sodium 136, GFR 47 03/03/2023 Creatinine 1.31, BUN 34, Potassium 3.8, Sodium 134, GFR 46 01/29/2023  Creatinine 1.14, BUN 21, Potassium 4.1, Sodium 136, GFR 54 01/15/2023 Creatinine 1.37, BUN 19, Potassium 3.4, Sodium 135, GFR 44 12/19/2022 Creatinine 1.28, BUN 14, Potassium 3.4, Sodium 138, GFR 47 (resulting in an increase in K+ dosage) A complete set of results can be found in Results Review.   Recommendations:    She is concerned about her kidneys if the takes the Metolazone but will do so to help her breathing.   She said she is unable to tolerate another night of not being able breathe and needs relief.    I have advised to use ER if needed but will send copy to [name], NP at [facility] clinic for review.    Follow-up plan: ICM clinic phone appointment on 05/21/2023 to recheck fluid levels.   91 day device clinic remote transmission 06/20/2023.     EP/Cardiology Office Visits:  05/28/2023 with HF clinic.      Copy of ICM check sent to Dr. Lalla Brothers.   3 month ICM trend: 05/15/2023.    12-14 Month ICM trend:     Karie Soda, RN 05/15/2023 1:28 PM

## 2023-05-15 NOTE — Telephone Encounter (Signed)
Called to confirm appt. Wed 6/5 @ 12:30.  No answer.  Saw notes regarding elevated BNP.  Will attempt to follow up later.  Also noted I heard a cardiac arrest dispatched over EMS dispatch in her building.  Concerned, I had my district supervisor stop by her room at the extended stay hotel to check on her status and have her call me- which she did shortly thereafter.  I will see her tomorrow 6/5 @ 12:00 for home visit.  [name], EMT-Paramedic [phone] 05/15/2023

## 2023-05-15 NOTE — Progress Notes (Signed)
Message Received: Today [place], Anderson Malta, FNP  Tiaria Biby, Josephine Igo, RN Burnett Kanaris, yes agree volume is up.,  If she is agreeable, would advise metolazone 2.5 with 40 KCL daily x 2 days. If no better, needs to go to ED for eval. Would like to repeat BMET in 1 week

## 2023-05-16 ENCOUNTER — Other Ambulatory Visit (HOSPITAL_COMMUNITY): Payer: Self-pay | Admitting: Emergency Medicine

## 2023-05-16 NOTE — Progress Notes (Addendum)
Paramedicine Encounter    Patient ID: Brittany [name], female    DOB: 05/22/1961, 62 y.o.   MRN: [id number]   Complaints NONE  Assessment A&O x 4, skin W&D w/ good color.  No obvious SOB and denies same.  Lung sounds clear & no peripheral edema noted.    Compliance with meds YES  Pill box filled no pill box- takes from bottles  Refills needed none  Meds changes since last visit 2.5mg  Metolazone & Potassium 6/4 & 6/5   Social changes none   BP 90/60 (BP Location: Left Arm, Patient Position: Sitting, Cuff Size: Small)   Pulse 92   Resp 16   Wt 97 lb (44 kg)   SpO2 94%   BMI 19.59 kg/m  Weight yesterday-not taken Last visit weight-97lb   Pt states she felt really SOB yesterday but states she feels much better today.  She does not appear to be obviously SOB today and is able to speak in complete sentences.  No obvious peripheral edema noted.  She took 2.5mg . Metolazone and Potassium yesterday per orders from [facility] and says she feels like it really worked.  She is planning on taking her second dose of metolazone today at the end of paramedicine visit.    I advised her should and thing change and she feels she needs immediate help to contact 911.  She advised she would do so if needed.  ACTION:  Home visit completed  Bethanie Dicker [phone] 05/16/23  Patient Care Team: Pcp, No as PCP - Kevin Fenton, MD (Inactive) as PCP - Electrophysiology (Cardiology) Bensimhon, Bevelyn Buckles, MD as PCP - Advanced Heart Failure (Cardiology)  Patient Active Problem List   Diagnosis Date Noted   Trichomonas infection 04/19/2023   Presence of heart assist device (HCC) 04/11/2023   COPD with acute exacerbation (HCC) 03/01/2023   CAD (coronary artery disease) 03/01/2023   Acute respiratory failure with hypoxia (HCC) 11/14/2022   Sepsis (HCC) 11/14/2022   Influenza A with pneumonia 11/14/2022   Troponin level elevated 11/14/2022   History of  lung cancer 11/14/2022   Acute combined systolic and diastolic CHF, NYHA class 4 (HCC) 07/09/2022   Malnutrition of moderate degree 06/20/2022   Lactic acidosis 04/12/2022   Prolonged QT interval 04/09/2022   Hypotension 04/09/2022   Chronic HFrEF (heart failure with reduced ejection fraction) (HCC) 12/11/2021   CHF exacerbation (HCC) 12/10/2021   Renal infarct (HCC) 12/10/2021   COPD exacerbation (HCC) 09/29/2021   Mixed diabetic hyperlipidemia associated with type 2 diabetes mellitus (HCC) 09/29/2021   CKD (chronic kidney disease) stage 3, GFR 30-59 ml/min (HCC) 09/29/2021   Unstable angina (HCC) 02/09/2021   Hemoptysis 10/20/2020   Type 2 diabetes mellitus with stage 3a chronic kidney disease, without long-term current use of insulin (HCC) 10/20/2020   Acute on chronic combined systolic and diastolic CHF (congestive heart failure) (HCC) 05/21/2020   Ischemic cardiomyopathy 04/12/2020   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   NSTEMI (non-ST elevated myocardial infarction) (HCC) 02/12/2019   CHF (congestive heart failure) (HCC) 11/24/2018   Recurrent pleural effusion on right 05/05/2018   Acute on chronic respiratory failure with hypoxia (HCC) 05/05/2018   Chronic respiratory failure with hypoxia (HCC)    Asthma    Anxiety    Allergy    HCAP (healthcare-associated pneumonia) 03/15/2015   Chest pain 03/15/2015   Small cell lung cancer (HCC) 03/16/2014   Protein-calorie malnutrition, severe (HCC) 03/14/2014   AF (paroxysmal atrial fibrillation) (HCC)  03/12/2014   Essential hypertension 05/07/2012    Current Outpatient Medications:    albuterol (VENTOLIN HFA) 108 (90 Base) MCG/ACT inhaler, Inhale 2 puffs into the lungs every 6 (six) hours as needed for wheezing or shortness of breath., Disp: 8.5 g, Rfl: 1   apixaban (ELIQUIS) 2.5 MG TABS tablet, Take 1 tablet (2.5 mg total) by mouth 2 (two) times daily., Disp: 180 tablet, Rfl: 1   atorvastatin (LIPITOR)  80 MG tablet, Take 1 tablet (80 mg total) by mouth daily., Disp: 90 tablet, Rfl: 0   blood glucose meter kit and supplies KIT, Dispense based on patient and insurance preference. Use up to four times daily as directed., Disp: 1 each, Rfl: 0   dapagliflozin propanediol (FARXIGA) 10 MG TABS tablet, Take 1 tablet by mouth daily., Disp: 30 tablet, Rfl: 11   digoxin (LANOXIN) 0.125 MG tablet, TAKE 1 TABLET BY MOUTH DAILY, Disp: 90 tablet, Rfl: 3   Fluticasone-Umeclidin-Vilant (TRELEGY ELLIPTA) 200-62.5-25 MCG/ACT AEPB, Inhale 1 puff into the lungs daily., Disp: 60 each, Rfl: 5   ipratropium-albuterol (DUONEB) 0.5-2.5 (3) MG/3ML SOLN, Take 3 mLs by nebulization every 4 (four) hours as needed (for shortness of breath)., Disp: 90 mL, Rfl: 3   ivabradine (CORLANOR) 7.5 MG TABS tablet, Take 1 tablet (7.5 mg total) by mouth 2 (two) times daily with a meal. TAKE 1 TABLET (7.5 MG TOTAL) BY MOUTH 2 (TWO) TIMES DAILY WITH A MEAL., Disp: 180 tablet, Rfl: 1   metolazone (ZAROXOLYN) 2.5 MG tablet, Take 1 tablet (2.5 mg total) by mouth as directed., Disp: 15 tablet, Rfl: 1   naphazoline-pheniramine (VISINE) 0.025-0.3 % ophthalmic solution, Place 1 drop into both eyes 4 (four) times daily as needed for eye irritation., Disp: , Rfl:    nitroGLYCERIN (NITROSTAT) 0.4 MG SL tablet, Place 1 tablet (0.4 mg total) under the tongue every 5 (five) minutes as needed for chest pain., Disp: 90 tablet, Rfl: 5   Nutritional Supplements (ENSURE ORIGINAL) LIQD, Take 2 Bottles by mouth daily., Disp: , Rfl:    spironolactone (ALDACTONE) 25 MG tablet, Take 0.5 tablets (12.5 mg total) by mouth at bedtime., Disp: 45 tablet, Rfl: 3   torsemide (DEMADEX) 20 MG tablet, Take 60 mg by mouth daily., Disp: , Rfl:  Allergies  Allergen Reactions   Lactose Intolerance (Gi)     Per patient   Codeine Nausea And Vomiting     Social History   Socioeconomic History   Marital status: Married    Spouse name: Tristan Schroeder    Number of children:  1   Years of education: Not on file   Highest education level: Not on file  Occupational History   Occupation: disabled  Tobacco Use   Smoking status: Former    Packs/day: 1.00    Years: 20.00    Additional pack years: 0.00    Total pack years: 20.00    Types: Cigarettes    Quit date: 03/13/2014    Years since quitting: 9.1   Smokeless tobacco: Never  Vaping Use   Vaping Use: Never used  Substance and Sexual Activity   Alcohol use: Yes    Alcohol/week: 5.0 standard drinks of alcohol    Types: 5 Glasses of wine per week    Comment: a few glasses of wine weekly   Drug use: Yes    Types: Marijuana    Comment: "medicinal", not daily   Sexual activity: Never  Other Topics Concern   Not on file  Social History Narrative  Not on file   Social Determinants of Health   Financial Resource Strain: Medium Risk (02/11/2021)   Overall Financial Resource Strain (CARDIA)    Difficulty of Paying Living Expenses: Somewhat hard  Food Insecurity: No Food Insecurity (04/19/2023)   Hunger Vital Sign    Worried About Running Out of Food in the Last Year: Never true    Ran Out of Food in the Last Year: Never true  Transportation Needs: No Transportation Needs (04/19/2023)   PRAPARE - Administrator, Civil Service (Medical): No    Lack of Transportation (Non-Medical): No  Physical Activity: Insufficiently Active (02/11/2021)   Exercise Vital Sign    Days of Exercise per Week: 2 days    Minutes of Exercise per Session: 10 min  Stress: Stress Concern Present (02/11/2021)   [employer] of Occupational Health - Occupational Stress Questionnaire    Feeling of Stress : To some extent  Social Connections: Unknown (02/11/2021)   Social Connection and Isolation Panel [NHANES]    Frequency of Communication with Friends and Family: More than three times a week    Frequency of Social Gatherings with Friends and Family: Twice a week    Attends Religious Services: Not on file    Active Member of  Clubs or Organizations: Not on file    Attends Banker Meetings: Never    Marital Status: Married  Catering manager Violence: Not At Risk (04/19/2023)   Humiliation, Afraid, Rape, and Kick questionnaire    Fear of Current or Ex-Partner: No    Emotionally Abused: No    Physically Abused: No    Sexually Abused: No    Physical Exam      Future Appointments  Date Time Provider Department Center  05/21/2023  7:35 AM [facility] DEVICE REMOTES [facility]  05/23/2023 12:30 PM [facility] LAB [facility] None  05/28/2023  9:00 AM [facility] PA/NP [facility] None  05/30/2023  8:20 AM Rema Fendt, NP [facility] None  06/13/2023 10:30 AM [facility] 1 [facility]  06/18/2023 11:00 AM [facility] RM [facility] None  06/18/2023  1:30 PM Virl Diamond A, MD [facility] None  06/20/2023 12:40 PM [facility] DEVICE REMOTES [facility]  09/19/2023 12:40 PM [facility] DEVICE REMOTES [facility]  12/19/2023 12:40 PM [facility] DEVICE REMOTES [facility]  03/19/2024 12:40 PM [facility] DEVICE REMOTES [facility]  06/18/2024 12:40 PM [facility] DEVICE REMOTES [facility]

## 2023-05-21 ENCOUNTER — Ambulatory Visit: Payer: BC Managed Care – PPO | Attending: Cardiology

## 2023-05-21 DIAGNOSIS — Z95810 Presence of automatic (implantable) cardiac defibrillator: Secondary | ICD-10-CM

## 2023-05-21 DIAGNOSIS — I5022 Chronic systolic (congestive) heart failure: Secondary | ICD-10-CM

## 2023-05-22 ENCOUNTER — Telehealth: Payer: Self-pay

## 2023-05-22 NOTE — Progress Notes (Signed)
EPIC Encounter for ICM Monitoring  Patient Name: Brittany [name] is a 62 y.o. female Date: 05/22/2023 Primary Care Physican: Pcp, No Primary Cardiologist: Bensimhon Electrophysiologist: Lalla Brothers 02/13/2023 Weight: 100-102 lbs 03/20/2023 Weight: 100 lbs 05/22/2023 Weight: 96 lbs   Spoke with patient and heart failure questions reviewed.  Transmission results reviewed.  Pt reports she is asymptomatic for fluid accumulation today.  She continues to poor appetite and drinking ensure to help get some nutrition.     Optivol Thoracic impedance suggesting possible dryness in response to taking Metolazone + Potassium daily x 2 days on 6/5-6/6 but returning close to baseline.      Prescribed: Torsemide 20 mg take 3 tablets (60 mg total) by mouth daily. Spironolactone 25 mg take 0.5 tablet (12.5 mg total) by mouth at bedtime Metolazone 2.5 mg take 1 tablet by mouth as needed   Labs: 05/23/2023 BMET Scheduled 05/14/2023 Creatinine 1.46, BUN 21, Potassium 3.6, Sodium 137, GFR 40 05/02/2023 Creatinine 1.95, BUN 27, Potassium 3.8, Sodium 137, GFR 29 04/22/2023 Creatinine 1.38, BUN 28, Potassium 5.3, Sodium 133, GFR 43  04/21/2023 Creatinine 1.51, BUN 26, Potassium 4.9, Sodium 133, GFR 39  04/20/2023 Creatinine 1.63, BUN 27, Potassium 4.5, Sodium 132, GFR 35  04/19/2023 Creatinine 1.63, BUN 28, Potassium 4.3, Sodium 133, GFR 35 04/18/2023 Creatinine 1.46, BUN 27, Potassium 5.3, Sodium 135, GFR 40  03/15/2023 Creatinine 1.14, BUN 36, Potassium 3.3, Sodium 140, GFR 54 03/07/2023 Creatinine 1.29, BUN 34, Potassium 4.5, Sodium 136, GFR 47 03/03/2023 Creatinine 1.31, BUN 34, Potassium 3.8, Sodium 134, GFR 46 01/29/2023 Creatinine 1.14, BUN 21, Potassium 4.1, Sodium 136, GFR 54 01/15/2023 Creatinine 1.37, BUN 19, Potassium 3.4, Sodium 135, GFR 44 12/19/2022 Creatinine 1.28, BUN 14, Potassium 3.4, Sodium 138, GFR 47 (resulting in an increase in K+ dosage) A complete set of results can be found in  Results Review.   Recommendations:   Advised to drink approximately 64 oz fluid today to rehydrate and drink fluids tomorrow morning prior to lab appointment.     Follow-up plan: ICM clinic phone appointment on 06/04/2023.   91 day device clinic remote transmission 06/20/2023.     EP/Cardiology Office Visits:  05/28/2023 with HF clinic.      Copy of ICM check sent to Dr. Lalla Brothers.   3 month ICM trend: 05/22/2023.    12-14 Month ICM trend:     Karie Soda, RN 05/22/2023 10:37 AM

## 2023-05-22 NOTE — Telephone Encounter (Signed)
Spoke with patient.  Requested manual remote transmission and she agreed to send today.

## 2023-05-23 ENCOUNTER — Other Ambulatory Visit: Payer: Self-pay

## 2023-05-23 ENCOUNTER — Telehealth (HOSPITAL_COMMUNITY): Payer: Self-pay | Admitting: Emergency Medicine

## 2023-05-23 ENCOUNTER — Encounter (HOSPITAL_COMMUNITY): Payer: Self-pay | Admitting: *Deleted

## 2023-05-23 ENCOUNTER — Other Ambulatory Visit (HOSPITAL_COMMUNITY): Payer: Self-pay | Admitting: Emergency Medicine

## 2023-05-23 ENCOUNTER — Emergency Department (HOSPITAL_COMMUNITY)
Admission: EM | Admit: 2023-05-23 | Discharge: 2023-05-23 | Disposition: A | Discharge status: Home / Self Care | Payer: BC Managed Care – PPO | Attending: Emergency Medicine | Admitting: Emergency Medicine

## 2023-05-23 ENCOUNTER — Ambulatory Visit (HOSPITAL_COMMUNITY)
Admission: RE | Admit: 2023-05-23 | Discharge: 2023-05-23 | Disposition: A | Discharge status: Home / Self Care | Payer: BC Managed Care – PPO | Source: Ambulatory Visit | Attending: Internal Medicine | Admitting: Internal Medicine

## 2023-05-23 ENCOUNTER — Telehealth (HOSPITAL_COMMUNITY): Payer: Self-pay | Admitting: Cardiology

## 2023-05-23 DIAGNOSIS — I5022 Chronic systolic (congestive) heart failure: Secondary | ICD-10-CM | POA: Insufficient documentation

## 2023-05-23 DIAGNOSIS — Z7901 Long term (current) use of anticoagulants: Secondary | ICD-10-CM | POA: Insufficient documentation

## 2023-05-23 DIAGNOSIS — E876 Hypokalemia: Secondary | ICD-10-CM | POA: Insufficient documentation

## 2023-05-23 LAB — CBC
HCT: 44.5 % (ref 36.0–46.0)
Hemoglobin: 14.3 g/dL (ref 12.0–15.0)
MCH: 28.7 pg (ref 26.0–34.0)
MCHC: 32.1 g/dL (ref 30.0–36.0)
MCV: 89.2 fL (ref 80.0–100.0)
Platelets: 265 10*3/uL (ref 150–400)
RBC: 4.99 MIL/uL (ref 3.87–5.11)
RDW: 19.4 % — ABNORMAL HIGH (ref 11.5–15.5)
WBC: 9.2 10*3/uL (ref 4.0–10.5)
nRBC: 0 % (ref 0.0–0.2)

## 2023-05-23 LAB — BASIC METABOLIC PANEL
Anion gap: 16 — ABNORMAL HIGH (ref 5–15)
BUN: 59 mg/dL — ABNORMAL HIGH (ref 8–23)
CO2: 33 mmol/L — ABNORMAL HIGH (ref 22–32)
Calcium: 10.1 mg/dL (ref 8.9–10.3)
Chloride: 84 mmol/L — ABNORMAL LOW (ref 98–111)
Creatinine, Ser: 1.58 mg/dL — ABNORMAL HIGH (ref 0.44–1.00)
GFR, Estimated: 37 mL/min — ABNORMAL LOW (ref >60)
Glucose, Bld: 239 mg/dL — ABNORMAL HIGH (ref 70–99)
Potassium: 2.6 mmol/L — CL (ref 3.5–5.1)
Sodium: 133 mmol/L — ABNORMAL LOW (ref 135–145)

## 2023-05-23 LAB — URINALYSIS, ROUTINE W REFLEX MICROSCOPIC
Bilirubin Urine: NEGATIVE
Glucose, UA: NEGATIVE mg/dL
Hgb urine dipstick: NEGATIVE
Ketones, ur: NEGATIVE mg/dL
Leukocytes,Ua: NEGATIVE
Nitrite: NEGATIVE
Protein, ur: NEGATIVE mg/dL
Specific Gravity, Urine: 1.013 (ref 1.005–1.030)
pH: 6 (ref 5.0–8.0)

## 2023-05-23 LAB — COMPREHENSIVE METABOLIC PANEL
ALT: 15 U/L (ref 0–44)
AST: 26 U/L (ref 15–41)
Albumin: 4.3 g/dL (ref 3.5–5.0)
Alkaline Phosphatase: 84 U/L (ref 38–126)
Anion gap: 16 — ABNORMAL HIGH (ref 5–15)
BUN: 62 mg/dL — ABNORMAL HIGH (ref 8–23)
CO2: 31 mmol/L (ref 22–32)
Calcium: 10 mg/dL (ref 8.9–10.3)
Chloride: 86 mmol/L — ABNORMAL LOW (ref 98–111)
Creatinine, Ser: 1.5 mg/dL — ABNORMAL HIGH (ref 0.44–1.00)
GFR, Estimated: 39 mL/min — ABNORMAL LOW (ref >60)
Glucose, Bld: 170 mg/dL — ABNORMAL HIGH (ref 70–99)
Potassium: 2.5 mmol/L — CL (ref 3.5–5.1)
Sodium: 133 mmol/L — ABNORMAL LOW (ref 135–145)
Total Bilirubin: 0.9 mg/dL (ref 0.3–1.2)
Total Protein: 8.1 g/dL (ref 6.5–8.1)

## 2023-05-23 LAB — MAGNESIUM: Magnesium: 2.8 mg/dL — ABNORMAL HIGH (ref 1.7–2.4)

## 2023-05-23 LAB — LIPASE, BLOOD: Lipase: 55 U/L — ABNORMAL HIGH (ref 11–51)

## 2023-05-23 MED ORDER — POTASSIUM CHLORIDE ER 20 MEQ PO TBCR: 40.0000 meq | EXTENDED_RELEASE_TABLET | Freq: Every day | ORAL | 0 refills | Status: DC

## 2023-05-23 MED ORDER — POTASSIUM CHLORIDE CRYS ER 20 MEQ PO TBCR
40.0000 meq | EXTENDED_RELEASE_TABLET | Freq: Once | ORAL | Status: AC
  Administered 2023-05-23: 40 meq via ORAL
  Filled 2023-05-23: qty 2

## 2023-05-23 MED ORDER — POTASSIUM CHLORIDE 10 MEQ/100ML IV SOLN
10.0000 meq | INTRAVENOUS | Status: AC
  Administered 2023-05-23 (×2): 10 meq via INTRAVENOUS
  Filled 2023-05-23 (×2): qty 100

## 2023-05-23 NOTE — ED Notes (Signed)
Awaiting patient from triage

## 2023-05-23 NOTE — Progress Notes (Signed)
Paramedicine Encounter    Patient ID: Brittany [name], female    DOB: 1961/06/03, 62 y.o.   MRN: [id number]   Complaints NONE  Assessment A&Ox 4, skin W&D w/ good color.  Lung sounds clear bilat. No edema noted.  Compliance with meds YES  Pill box filled   Refills needed NONE  Meds changes since last visit    Social changes NONE   BP 100/68 (BP Location: Left Arm, Patient Position: Sitting, Cuff Size: Small)   Pulse 100   Wt 94 lb (42.6 kg)   SpO2 99%   BMI 18.99 kg/m  Weight yesterday- not taken Last visit weight-97lb  ACTION: Home visit completed  Bethanie Dicker [phone] 05/23/23  Patient Care Team: Pcp, No as PCP - Kevin Fenton, MD (Inactive) as PCP - Electrophysiology (Cardiology) Bensimhon, Bevelyn Buckles, MD as PCP - Advanced Heart Failure (Cardiology)  Patient Active Problem List   Diagnosis Date Noted   Trichomonas infection 04/19/2023   Presence of heart assist device (HCC) 04/11/2023   COPD with acute exacerbation (HCC) 03/01/2023   CAD (coronary artery disease) 03/01/2023   Acute respiratory failure with hypoxia (HCC) 11/14/2022   Sepsis (HCC) 11/14/2022   Influenza A with pneumonia 11/14/2022   Troponin level elevated 11/14/2022   History of lung cancer 11/14/2022   Acute combined systolic and diastolic CHF, NYHA class 4 (HCC) 07/09/2022   Malnutrition of moderate degree 06/20/2022   Lactic acidosis 04/12/2022   Prolonged QT interval 04/09/2022   Hypotension 04/09/2022   Chronic HFrEF (heart failure with reduced ejection fraction) (HCC) 12/11/2021   CHF exacerbation (HCC) 12/10/2021   Renal infarct (HCC) 12/10/2021   COPD exacerbation (HCC) 09/29/2021   Mixed diabetic hyperlipidemia associated with type 2 diabetes mellitus (HCC) 09/29/2021   CKD (chronic kidney disease) stage 3, GFR 30-59 ml/min (HCC) 09/29/2021   Unstable angina (HCC) 02/09/2021   Hemoptysis 10/20/2020   Type 2 diabetes mellitus with stage 3a  chronic kidney disease, without long-term current use of insulin (HCC) 10/20/2020   Acute on chronic combined systolic and diastolic CHF (congestive heart failure) (HCC) 05/21/2020   Ischemic cardiomyopathy 04/12/2020   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   NSTEMI (non-ST elevated myocardial infarction) (HCC) 02/12/2019   CHF (congestive heart failure) (HCC) 11/24/2018   Recurrent pleural effusion on right 05/05/2018   Acute on chronic respiratory failure with hypoxia (HCC) 05/05/2018   Chronic respiratory failure with hypoxia (HCC)    Asthma    Anxiety    Allergy    HCAP (healthcare-associated pneumonia) 03/15/2015   Chest pain 03/15/2015   Small cell lung cancer (HCC) 03/16/2014   Protein-calorie malnutrition, severe (HCC) 03/14/2014   AF (paroxysmal atrial fibrillation) (HCC) 03/12/2014   Essential hypertension 05/07/2012    Current Outpatient Medications:    albuterol (VENTOLIN HFA) 108 (90 Base) MCG/ACT inhaler, Inhale 2 puffs into the lungs every 6 (six) hours as needed for wheezing or shortness of breath., Disp: 8.5 g, Rfl: 1   apixaban (ELIQUIS) 2.5 MG TABS tablet, Take 1 tablet (2.5 mg total) by mouth 2 (two) times daily., Disp: 180 tablet, Rfl: 1   atorvastatin (LIPITOR) 80 MG tablet, Take 1 tablet (80 mg total) by mouth daily., Disp: 90 tablet, Rfl: 0   blood glucose meter kit and supplies KIT, Dispense based on patient and insurance preference. Use up to four times daily as directed., Disp: 1 each, Rfl: 0   dapagliflozin propanediol (FARXIGA) 10 MG TABS tablet, Take 1 tablet by mouth daily.,  Disp: 30 tablet, Rfl: 11   digoxin (LANOXIN) 0.125 MG tablet, TAKE 1 TABLET BY MOUTH DAILY, Disp: 90 tablet, Rfl: 3   Fluticasone-Umeclidin-Vilant (TRELEGY ELLIPTA) 200-62.5-25 MCG/ACT AEPB, Inhale 1 puff into the lungs daily., Disp: 60 each, Rfl: 5   ipratropium-albuterol (DUONEB) 0.5-2.5 (3) MG/3ML SOLN, Take 3 mLs by nebulization every 4 (four) hours as  needed (for shortness of breath)., Disp: 90 mL, Rfl: 3   ivabradine (CORLANOR) 7.5 MG TABS tablet, Take 1 tablet (7.5 mg total) by mouth 2 (two) times daily with a meal. TAKE 1 TABLET (7.5 MG TOTAL) BY MOUTH 2 (TWO) TIMES DAILY WITH A MEAL., Disp: 180 tablet, Rfl: 1   Nutritional Supplements (ENSURE ORIGINAL) LIQD, Take 2 Bottles by mouth daily., Disp: , Rfl:    spironolactone (ALDACTONE) 25 MG tablet, Take 0.5 tablets (12.5 mg total) by mouth at bedtime., Disp: 45 tablet, Rfl: 3   torsemide (DEMADEX) 20 MG tablet, Take 60 mg by mouth daily., Disp: , Rfl:    metolazone (ZAROXOLYN) 2.5 MG tablet, Take 1 tablet (2.5 mg total) by mouth as directed., Disp: 15 tablet, Rfl: 1   naphazoline-pheniramine (VISINE) 0.025-0.3 % ophthalmic solution, Place 1 drop into both eyes 4 (four) times daily as needed for eye irritation., Disp: , Rfl:    nitroGLYCERIN (NITROSTAT) 0.4 MG SL tablet, Place 1 tablet (0.4 mg total) under the tongue every 5 (five) minutes as needed for chest pain., Disp: 90 tablet, Rfl: 5 Allergies  Allergen Reactions   Lactose Intolerance (Gi)     Per patient   Codeine Nausea And Vomiting     Social History   Socioeconomic History   Marital status: Married    Spouse name: Tristan Schroeder    Number of children: 1   Years of education: Not on file   Highest education level: Not on file  Occupational History   Occupation: disabled  Tobacco Use   Smoking status: Former    Packs/day: 1.00    Years: 20.00    Additional pack years: 0.00    Total pack years: 20.00    Types: Cigarettes    Quit date: 03/13/2014    Years since quitting: 9.2   Smokeless tobacco: Never  Vaping Use   Vaping Use: Never used  Substance and Sexual Activity   Alcohol use: Yes    Alcohol/week: 5.0 standard drinks of alcohol    Types: 5 Glasses of wine per week    Comment: a few glasses of wine weekly   Drug use: Yes    Types: Marijuana    Comment: "medicinal", not daily   Sexual activity: Never  Other  Topics Concern   Not on file  Social History Narrative   Not on file   Social Determinants of Health   Financial Resource Strain: Medium Risk (02/11/2021)   Overall Financial Resource Strain (CARDIA)    Difficulty of Paying Living Expenses: Somewhat hard  Food Insecurity: No Food Insecurity (04/19/2023)   Hunger Vital Sign    Worried About Running Out of Food in the Last Year: Never true    Ran Out of Food in the Last Year: Never true  Transportation Needs: No Transportation Needs (04/19/2023)   PRAPARE - Administrator, Civil Service (Medical): No    Lack of Transportation (Non-Medical): No  Physical Activity: Insufficiently Active (02/11/2021)   Exercise Vital Sign    Days of Exercise per Week: 2 days    Minutes of Exercise per Session: 10 min  Stress:  Stress Concern Present (02/11/2021)   [employer] of Occupational Health - Occupational Stress Questionnaire    Feeling of Stress : To some extent  Social Connections: Unknown (02/11/2021)   Social Connection and Isolation Panel [NHANES]    Frequency of Communication with Friends and Family: More than three times a week    Frequency of Social Gatherings with Friends and Family: Twice a week    Attends Religious Services: Not on file    Active Member of Clubs or Organizations: Not on file    Attends Banker Meetings: Never    Marital Status: Married  Catering manager Violence: Not At Risk (04/19/2023)   Humiliation, Afraid, Rape, and Kick questionnaire    Fear of Current or Ex-Partner: No    Emotionally Abused: No    Physically Abused: No    Sexually Abused: No    Physical Exam      Future Appointments  Date Time Provider Department Center  05/28/2023  9:00 AM [facility] PA/NP [facility] None  05/30/2023  8:20 AM Rema Fendt, NP [facility] None  06/04/2023  7:25 AM [facility] DEVICE REMOTES [facility]  06/13/2023 10:30 AM [facility] 1 [facility]  06/18/2023 11:00 AM [facility] RM [facility] None   06/18/2023  1:30 PM Olalere, Adewale A, MD [facility] None  06/20/2023 12:40 PM [facility] DEVICE REMOTES [facility]  09/19/2023 12:40 PM [facility] DEVICE REMOTES [facility]  12/19/2023 12:40 PM [facility] DEVICE REMOTES [facility]  03/19/2024 12:40 PM [facility] DEVICE REMOTES [facility]  06/18/2024 12:40 PM [facility] DEVICE REMOTES [facility]

## 2023-05-23 NOTE — Telephone Encounter (Signed)
Critical results [facility] K 2.6  Per Shanda Bumps [name],NP Will need to report to ER for supplementation   Pt aware and voiced understanding

## 2023-05-23 NOTE — ED Triage Notes (Signed)
The pt was sent here by the doctors office after getting blood drawn she was called on the way home and was told to return that she had a low potassium

## 2023-05-23 NOTE — ED Provider Notes (Signed)
[place] [facility] DEPARTMENT AT [facility] Provider Note   CSN: [id number] Arrival date & time: 05/23/23  1456     History  Chief Complaint  Patient presents with   potassium low    Brittany [name] is a 62 y.o. female.  HPI   62 year old female presents emergency department with concern for hypokalemia.  Patient had outpatient blood work done and was called today stating that her potassium was low and referred her to the ER.  Patient has never had low potassium before.  She admits that almost a week ago she had 1 day where she had multiple episodes of vomiting but otherwise states that she has recovered.  No ongoing vomiting or diarrhea.  No acute changes in her medications.  She denies any chest pain, shortness of breath or other acute symptoms.  States that she is otherwise felt well and was going about her daily activities today.  Home Medications Prior to Admission medications   Medication Sig Start Date End Date Taking? Authorizing Provider  potassium chloride 20 MEQ TBCR Take 2 tablets (40 mEq total) by mouth daily. 05/23/23  Yes Effie Wahlert M, DO  albuterol (VENTOLIN HFA) 108 (90 Base) MCG/ACT inhaler Inhale 2 puffs into the lungs every 6 (six) hours as needed for wheezing or shortness of breath. 06/20/22   Zannie Cove, MD  apixaban (ELIQUIS) 2.5 MG TABS tablet Take 1 tablet (2.5 mg total) by mouth 2 (two) times daily. 04/17/23   Clegg, Amy D, NP  atorvastatin (LIPITOR) 80 MG tablet Take 1 tablet (80 mg total) by mouth daily. 04/23/23 07/22/23  Darlin Drop, DO  blood glucose meter kit and supplies KIT Dispense based on patient and insurance preference. Use up to four times daily as directed. 04/11/21   Osvaldo Shipper, MD  dapagliflozin propanediol (FARXIGA) 10 MG TABS tablet Take 1 tablet by mouth daily. 09/04/22   Bensimhon, Bevelyn Buckles, MD  digoxin (LANOXIN) 0.125 MG tablet TAKE 1 TABLET BY MOUTH DAILY 04/23/23   Jacklynn Ganong, FNP   Fluticasone-Umeclidin-Vilant (TRELEGY ELLIPTA) 200-62.5-25 MCG/ACT AEPB Inhale 1 puff into the lungs daily. 04/11/23   Olalere, Onnie Boer A, MD  ipratropium-albuterol (DUONEB) 0.5-2.5 (3) MG/3ML SOLN Take 3 mLs by nebulization every 4 (four) hours as needed (for shortness of breath). 07/12/22   Laurey Morale, MD  ivabradine (CORLANOR) 7.5 MG TABS tablet Take 1 tablet (7.5 mg total) by mouth 2 (two) times daily with a meal. TAKE 1 TABLET (7.5 MG TOTAL) BY MOUTH 2 (TWO) TIMES DAILY WITH A MEAL. 10/10/22   Bensimhon, Bevelyn Buckles, MD  metolazone (ZAROXOLYN) 2.5 MG tablet Take 1 tablet (2.5 mg total) by mouth as directed. 03/07/23   Bensimhon, Bevelyn Buckles, MD  naphazoline-pheniramine (VISINE) 0.025-0.3 % ophthalmic solution Place 1 drop into both eyes 4 (four) times daily as needed for eye irritation.    [provider]  nitroGLYCERIN (NITROSTAT) 0.4 MG SL tablet Place 1 tablet (0.4 mg total) under the tongue every 5 (five) minutes as needed for chest pain. 02/14/19   Graciella Freer, PA-C  Nutritional Supplements (ENSURE ORIGINAL) LIQD Take 2 Bottles by mouth daily.    [provider]  spironolactone (ALDACTONE) 25 MG tablet Take 0.5 tablets (12.5 mg total) by mouth at bedtime. 05/02/23 07/31/23  Jacklynn Ganong, FNP  torsemide (DEMADEX) 20 MG tablet Take 60 mg by mouth daily.    [provider]      Allergies    Lactose intolerance (gi) and Codeine  Review of Systems   Review of Systems  Constitutional:  Negative for fever.  Respiratory:  Negative for shortness of breath.   Cardiovascular:  Negative for chest pain.  Gastrointestinal:  Negative for abdominal pain, diarrhea and vomiting.  Skin:  Negative for rash.  Neurological:  Negative for headaches.    Physical Exam Updated Vital Signs BP 115/62   Pulse 86   Temp 98.6 F (37 C) (Oral)   Resp 16   Ht 4\' 11"  (1.499 m)   Wt 42.6 kg   SpO2 100%   BMI 18.97 kg/m  Physical Exam Vitals and nursing note  reviewed.  Constitutional:      Appearance: Normal appearance.  HENT:     Head: Normocephalic.     Mouth/Throat:     Mouth: Mucous membranes are moist.  Cardiovascular:     Rate and Rhythm: Normal rate.  Pulmonary:     Effort: Pulmonary effort is normal. No respiratory distress.  Abdominal:     Palpations: Abdomen is soft.     Tenderness: There is no abdominal tenderness.  Skin:    General: Skin is warm.  Neurological:     Mental Status: She is alert and oriented to person, place, and time. Mental status is at baseline.  Psychiatric:        Mood and Affect: Mood normal.     ED Results / Procedures / Treatments   Labs (all labs ordered are listed, but only abnormal results are displayed) Labs Reviewed  LIPASE, BLOOD - Abnormal; Notable for the following components:      Result Value   Lipase 55 (*)    All other components within normal limits  COMPREHENSIVE METABOLIC PANEL - Abnormal; Notable for the following components:   Sodium 133 (*)    Potassium 2.5 (*)    Chloride 86 (*)    Glucose, Bld 170 (*)    BUN 62 (*)    Creatinine, Ser 1.50 (*)    GFR, Estimated 39 (*)    Anion gap 16 (*)    All other components within normal limits  CBC - Abnormal; Notable for the following components:   RDW 19.4 (*)    All other components within normal limits  URINALYSIS, ROUTINE W REFLEX MICROSCOPIC - Abnormal; Notable for the following components:   Bacteria, UA RARE (*)    All other components within normal limits  MAGNESIUM - Abnormal; Notable for the following components:   Magnesium 2.8 (*)    All other components within normal limits    EKG EKG Interpretation  Date/Time:  Wednesday May 23 2023 16:09:38 EDT Ventricular Rate:  86 PR Interval:  173 QRS Duration: 139 QT Interval:  451 QTC Calculation: 540 R Axis:   -42 Text Interpretation: Poor quality data, interpretation may be affected Sinus rhythm Ventricular premature complex Right atrial enlargement RBBB and  LAFB Consider left ventricular hypertrophy Lateral infarct, acute Prolonged QT interval Artifact in lead(s) I II III aVR aVL aVF V1 V3 V4 V5 V6 >>> Acute MI <<< Confirmed by Coralee Pesa [phone]) on 05/23/2023 4:46:29 PM  Radiology No results found.  Procedures Procedures    Medications Ordered in ED Medications  potassium chloride 10 mEq in 100 mL IVPB (0 mEq Intravenous Stopped 05/23/23 2016)  potassium chloride SA (KLOR-CON M) CR tablet 40 mEq (40 mEq Oral Given 05/23/23 1754)    ED Course/ Medical Decision Making/ A&P  Medical Decision Making Amount and/or Complexity of Data Reviewed Labs: ordered.  Risk Prescription drug management.   62 year old female presents emergency department concern for hypokalemia.  With this was found on outpatient blood work.  Otherwise besides a couple episodes of vomiting about a week ago she has no acute complaints.  States he otherwise feels baseline.  Denies any chest pain, abdominal pain, muscle cramping.  Vitals are stable on arrival.  EKG does not appear to have any acute abnormalities but she has a bile stent in place making full evaluation difficult.  Of note she had a couple hypotensive readings when she was laying on the arm that the cuff was on but this was readjusted and she had appropriate blood pressures.  Repeat blood work here shows that the potassium is 2.5, magnesium is not low.  She has a mild AKI which is baseline.  Only other pertinent finding is the lipase was slightly elevated.  Her abdomen is benign, she has no ongoing vomiting/diarrhea symptoms.  Unclear if the vomiting she had about a week ago could have been pancreatitis related but she has no other ongoing symptoms with a benign abdomen, do not feel any further workup in regards to this is warranted.  Patient was given IV and oral replacement of potassium.  She tolerated well.  Has ambulated department multiple times.  Monitor shows sinus rhythm.   Patient understands that she has to follow-up with her primary doctor for repeat blood work and further monitoring.  She has been prescribed oral potassium to continue taking over the past couple days.  No indication for emergent admission.  She otherwise feels well and baseline.  Plan for outpatient follow-up.  Patient at this time appears safe and stable for discharge and close outpatient follow up. Discharge plan and strict return to ED precautions discussed, patient verbalizes understanding and agreement.        Final Clinical Impression(s) / ED Diagnoses Final diagnoses:  Hypokalemia    Rx / DC Orders ED Discharge Orders          Ordered    potassium chloride 20 MEQ TBCR  Daily        05/23/23 2041              Rozelle Logan, DO 05/23/23 2054

## 2023-05-23 NOTE — Discharge Instructions (Signed)
You have been seen and discharged from the emergency department.  You were given IV and oral replacement of potassium.  The rest of your blood work looked appropriate.  Continue to take daily potassium supplement.  Report to primary doctor for repeat outpatient blood work and further trending.  Follow-up with your primary provider for further evaluation and further care. Take home medications as prescribed. If you have any worsening symptoms or further concerns for your health please return to an emergency department for further evaluation.

## 2023-05-23 NOTE — Telephone Encounter (Signed)
Text Brittany [name] @ 9:28 to remind her of her lab appointment at the clinic and paramedicine visit to be done  at the office today.  She advised she will be there.    [name], EMT-Paramedic [phone] 05/23/2023

## 2023-05-23 NOTE — ED Notes (Signed)
Pt reports palpitations yesterday with lightlessness.

## 2023-05-24 ENCOUNTER — Telehealth (HOSPITAL_COMMUNITY): Payer: Self-pay | Admitting: Emergency Medicine

## 2023-05-24 NOTE — Telephone Encounter (Signed)
Spoke with Brittany [name] today and she was still in the bed.  We discussed her low potassium and her visit back to the ED.  She advises she does have her Potassium and understands to take daily.      [name], EMT-Paramedic [phone] 05/24/2023

## 2023-05-28 ENCOUNTER — Ambulatory Visit (HOSPITAL_BASED_OUTPATIENT_CLINIC_OR_DEPARTMENT_OTHER): Payer: BC Managed Care – PPO

## 2023-05-28 ENCOUNTER — Encounter (HOSPITAL_COMMUNITY): Payer: Self-pay

## 2023-05-28 ENCOUNTER — Ambulatory Visit (HOSPITAL_COMMUNITY)
Admission: RE | Admit: 2023-05-28 | Discharge: 2023-05-28 | Disposition: A | Discharge status: Home / Self Care | Payer: BC Managed Care – PPO | Source: Ambulatory Visit | Attending: Family Medicine | Admitting: Family Medicine

## 2023-05-28 ENCOUNTER — Other Ambulatory Visit (HOSPITAL_COMMUNITY): Payer: Self-pay | Admitting: Emergency Medicine

## 2023-05-28 ENCOUNTER — Telehealth (HOSPITAL_COMMUNITY): Payer: Self-pay | Admitting: Emergency Medicine

## 2023-05-28 VITALS — BP 104/72 | HR 86 | Wt 99.0 lb

## 2023-05-28 DIAGNOSIS — R002 Palpitations: Secondary | ICD-10-CM | POA: Insufficient documentation

## 2023-05-28 DIAGNOSIS — Z7901 Long term (current) use of anticoagulants: Secondary | ICD-10-CM | POA: Insufficient documentation

## 2023-05-28 DIAGNOSIS — J449 Chronic obstructive pulmonary disease, unspecified: Secondary | ICD-10-CM | POA: Diagnosis not present

## 2023-05-28 DIAGNOSIS — I110 Hypertensive heart disease with heart failure: Secondary | ICD-10-CM | POA: Diagnosis not present

## 2023-05-28 DIAGNOSIS — J90 Pleural effusion, not elsewhere classified: Secondary | ICD-10-CM

## 2023-05-28 DIAGNOSIS — I2510 Atherosclerotic heart disease of native coronary artery without angina pectoris: Secondary | ICD-10-CM | POA: Diagnosis not present

## 2023-05-28 DIAGNOSIS — I480 Paroxysmal atrial fibrillation: Secondary | ICD-10-CM | POA: Insufficient documentation

## 2023-05-28 DIAGNOSIS — J440 Chronic obstructive pulmonary disease with acute lower respiratory infection: Secondary | ICD-10-CM | POA: Diagnosis not present

## 2023-05-28 DIAGNOSIS — Z955 Presence of coronary angioplasty implant and graft: Secondary | ICD-10-CM | POA: Insufficient documentation

## 2023-05-28 DIAGNOSIS — Z85118 Personal history of other malignant neoplasm of bronchus and lung: Secondary | ICD-10-CM | POA: Diagnosis not present

## 2023-05-28 DIAGNOSIS — Z87891 Personal history of nicotine dependence: Secondary | ICD-10-CM | POA: Diagnosis not present

## 2023-05-28 DIAGNOSIS — R42 Dizziness and giddiness: Secondary | ICD-10-CM | POA: Diagnosis not present

## 2023-05-28 DIAGNOSIS — Z8249 Family history of ischemic heart disease and other diseases of the circulatory system: Secondary | ICD-10-CM | POA: Insufficient documentation

## 2023-05-28 DIAGNOSIS — J9611 Chronic respiratory failure with hypoxia: Secondary | ICD-10-CM | POA: Diagnosis not present

## 2023-05-28 DIAGNOSIS — Z7984 Long term (current) use of oral hypoglycemic drugs: Secondary | ICD-10-CM | POA: Insufficient documentation

## 2023-05-28 DIAGNOSIS — I5022 Chronic systolic (congestive) heart failure: Secondary | ICD-10-CM | POA: Diagnosis present

## 2023-05-28 DIAGNOSIS — I252 Old myocardial infarction: Secondary | ICD-10-CM | POA: Insufficient documentation

## 2023-05-28 DIAGNOSIS — C3490 Malignant neoplasm of unspecified part of unspecified bronchus or lung: Secondary | ICD-10-CM

## 2023-05-28 DIAGNOSIS — Z79899 Other long term (current) drug therapy: Secondary | ICD-10-CM | POA: Diagnosis not present

## 2023-05-28 DIAGNOSIS — Z951 Presence of aortocoronary bypass graft: Secondary | ICD-10-CM | POA: Diagnosis not present

## 2023-05-28 DIAGNOSIS — N2889 Other specified disorders of kidney and ureter: Secondary | ICD-10-CM | POA: Diagnosis not present

## 2023-05-28 DIAGNOSIS — E875 Hyperkalemia: Secondary | ICD-10-CM | POA: Diagnosis not present

## 2023-05-28 DIAGNOSIS — N280 Ischemia and infarction of kidney: Secondary | ICD-10-CM

## 2023-05-28 LAB — BASIC METABOLIC PANEL
Anion gap: 14 (ref 5–15)
BUN: 29 mg/dL — ABNORMAL HIGH (ref 8–23)
CO2: 27 mmol/L (ref 22–32)
Calcium: 9.8 mg/dL (ref 8.9–10.3)
Chloride: 96 mmol/L — ABNORMAL LOW (ref 98–111)
Creatinine, Ser: 1.27 mg/dL — ABNORMAL HIGH (ref 0.44–1.00)
GFR, Estimated: 48 mL/min — ABNORMAL LOW (ref >60)
Glucose, Bld: 152 mg/dL — ABNORMAL HIGH (ref 70–99)
Potassium: 3.5 mmol/L (ref 3.5–5.1)
Sodium: 137 mmol/L (ref 135–145)

## 2023-05-28 LAB — BRAIN NATRIURETIC PEPTIDE: B Natriuretic Peptide: 2065.6 pg/mL — ABNORMAL HIGH (ref 0.0–100.0)

## 2023-05-28 MED ORDER — POTASSIUM CHLORIDE ER 20 MEQ PO TBCR: EXTENDED_RELEASE_TABLET | ORAL | 6 refills | Status: DC

## 2023-05-28 MED ORDER — TORSEMIDE 20 MG PO TABS: ORAL_TABLET | ORAL | 6 refills | Status: DC

## 2023-05-28 MED ORDER — SPIRONOLACTONE 25 MG PO TABS: 25.0000 mg | ORAL_TABLET | Freq: Every day | ORAL | 3 refills | Status: DC

## 2023-05-28 NOTE — Addendum Note (Signed)
Encounter addended by: Demetrius Charity, RN on: 05/28/2023 9:44 AM  Actions taken: Clinical Note Signed

## 2023-05-28 NOTE — Progress Notes (Signed)
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 Underwent emergent cath 02/03/18 showed LAD 100% stenosed, LCx 95% stenosed. Taken for emergent CABG 02/03/18. Required impella post op. Hospital course complicated by cardiogenic shock, HCAP, A fib, respiratory failure, and swallowing issues. She was discharged to SNF. Discharge weight 103 pounds.    In 2019 had multiple hospitalizations for HF and pleural effusion. Underwent pleurodesis at [facility].   Admitted 3/20 with NSTEMI and HF. Received DES to ostial ramus into distal left main based on cath below and underwent diuresis. Meds adjusted as tolerated. Echo with EF 25-30%.   Admitted 11/21 with COPD flare with mild HF and hemoptysis. CTA showed stable scarring in the right hilum consistent with emphysema.. Underwent bronchoscopy on 11/15, no obvious source of bleeding found on examination.   Echo 11/21 EF 20-25%. RV mildly HK.   Admitted 3/22 Echo with EF < 20%. Treated for AECOPD exacerbation.   Admitted 12/22 with R renal infarct felt to be cardio-embolic (no AF found). Started on Eliquis  Admitted 8/23 w/ a/c respiratory failure and CHF. Concern for low output vs PNA vs COPD exacerbation. PICC placed and milrinone started. Diuresed w/ IV Lasix. Given prednisone taper and doxy for COPD exacerbation. Echo showed EF 20-25%, global hypokinesis, GIIDD, RV systolic function, mildly reduced. RV size normal. Mild MR, trivial TR, aortic valve w/ mild-mod regurg.  Barostim placed.   Admitted 12/23 with COPDE, a/c CHF and  influenza +. Placed on BiPap, treated with tamiflu and diuresed with IV lasix. Empirically started milrinone over concern for low output  She was seen 3/24 in the HF clinic. Volume overloaded. Instructed to take torsemide 80 mg daily and take metolazone 3/27 and 03/09/23.  Follow up 4/24, NYHA III-IIIb, volume OK, ReDs 35%. Referred to paramedicine and Pulmonology.  Admitted 5/24 with N/V. CT ok, wet prep + trich, treated with IV Flagyl.  HsTroponin elevated. Cards consulted felt elevated troponin 2/2 demand ischemia. Echo showed EF 20-25%, LV with global HK, RV mildly reduced, moderate AI. [name] and KCL stopped with AKI and she was discharged home, weight 101 lbs.  Today she returns for HF follow up. Labs last week showed K 2.6 and she was advised to go to ED. She received IV and oral K replacement. Overall feeling fair. Breathing bad last couple of days, had to sleep upright last couple of days. Took extra 20 mg torsemide, metolazone 2.5 mg + 40 KCL yesterday, had good urinary response. She did not take additional KCL when taking her metolazone. Has positional dizziness and palpitations. Denies CP, edema, or PND. Appetite poor. No fever or chills. Weight at home 96 pounds. Taking all medications. Drinking a lot of Ensures during the day. Doing breathing treatments at home. Followed by paramedicine but would like to stop, feels overwhelmed by it.  Cardidac Studies: - Echo (5/24): EF 20-25%, LV with global HK, RV mildly reduced, moderate AI.  - Echo (7/23): EF 20-25%, global hypokinesis, GIIDD, RV systolic function, mildly reduced. RV size normal. Mild MR, trivial TR, aortic valve w/ mild-mod  regurg  - Echo 1/23: EF 20-25%, severely reduced LV with global HK, grade II DD, normal RV  - Echo 02/10/21: EF <20%, G2DD  - Echo 2022: EF < 20% RV normal   - Echo 2021: EF 20-25% RV mildly reduced.   - Echo 02/12/19: EF 25-30%, RV mildly reduced  - 05/18/2021 PFT's: moderately severe airway obstruction and a  diffusion defect suggesting emphysema.   - LHC 02/12/19: Ost LAD to Prox LAD lesion is 100% stenosed. Ost Cx to Prox Cx lesion is 100% stenosed. Ost Ramus lesion is 99% stenosed. Post intervention, there is a 0% residual stenosis. A drug-eluting stent was successfully placed using a STENT RESOLUTE ONYX 2.25X15. SVG and is normal in caliber. The graft exhibits no disease. Ost 1st Diag lesion is 70% stenosed. SVG and is normal in caliber. Prox Graft lesion is 100% stenosed. 1.  Significant underlying two-vessel coronary artery disease with patent SVG to OM and SVG to diagonal which supplies the LAD territory.  Atretic LIMA to LAD given competitive flow from SVG to diagonal.  Severe ostial ramus artery stenosis not supplied by bypass with his area suggestive of plaque rupture. 2.  Severely elevated left ventricular end-diastolic pressure 34 mmHg.  Left ventricular angiography was not performed. 3.  Successful angioplasty and drug-eluting stent placement to the ostial ramus extending into the distal left main.  - RHC 2019  RA = 8 RV = 49/9 PA = 47/20 (32) PCW = 19 Fick cardiac output/index = 4.3/3.1 PVR = 3.0 wu AO sat = 93% PA sat = 62%, 64%   1. Minimally elevated left heart pressures 2. Mild PAH 3. Normal cardiac output  Past Medical History:  Diagnosis Date   Acute respiratory failure (HCC) 05/05/2018   Acute systolic congestive heart failure (HCC) 02/03/2018   AICD (automatic cardioverter/defibrillator) present    Allergy    Anxiety    Asthma    DM2 (diabetes mellitus, type 2) (HCC) 10/20/2020   Hypertension    PAF (paroxysmal atrial fibrillation) (HCC)    Presence of permanent cardiac pacemaker    Prophylactic measure 08/03/14-08/19/14   Prophyl. 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   LIMA to LAD, SVG to D1, SVG to OM1, EVH via right thigh with implantation of Impella LD LVAD via direct aortic approach   Small cell lung cancer (HCC) 03/16/2014    Past Surgical History:  Procedure Laterality Date   BRONCHIAL BRUSHINGS  10/25/2020   Procedure: BRONCHIAL BRUSHINGS;  Surgeon: Leslye Peer, MD;  Location: [facility] ENDOSCOPY;  Service: Pulmonary;;   BRONCHIAL NEEDLE ASPIRATION BIOPSY  10/25/2020   Procedure: BRONCHIAL NEEDLE ASPIRATION BIOPSIES;  Surgeon: Leslye Peer, MD;  Location: [facility] ENDOSCOPY;  Service: Pulmonary;;   CARDIAC DEFIBRILLATOR PLACEMENT  08/15/2018   MDT Visia AF MRI VR ICD implanted by Dr Georgena Spurling for primary prevention of sudden   CESAREAN SECTION     CORONARY ARTERY BYPASS GRAFT N/A 02/03/2018   Procedure: CORONARY ARTERY BYPASS GRAFTING (CABG);  Surgeon: Purcell Nails, MD;  Location: [facility] OR;  Service: Open Heart Surgery;  Laterality: N/A;  Time 3 using left internal mammary artery and endoscopically harvested right saphenous vein   CORONARY BALLOON ANGIOPLASTY N/A 02/03/2018   Procedure: CORONARY BALLOON ANGIOPLASTY;  Surgeon: Swaziland, Peter M, MD;  Location: [facility] INVASIVE CV LAB;  Service: Cardiovascular;  Laterality: N/A;   CORONARY STENT INTERVENTION N/A 02/12/2019   Procedure: CORONARY STENT INTERVENTION;  Surgeon: Iran Ouch, MD;  Location: [facility] INVASIVE CV LAB;  Service: Cardiovascular;  Laterality: N/A;   CORONARY/GRAFT ACUTE MI REVASCULARIZATION N/A 02/03/2018   Procedure: Coronary/Graft Acute MI Revascularization;  Surgeon: Swaziland, Peter M, MD;  Location: [facility] INVASIVE CV LAB;  Service: Cardiovascular;  Laterality: N/A;   ENDOBRONCHIAL ULTRASOUND N/A 10/25/2020   Procedure: ENDOBRONCHIAL ULTRASOUND;  Surgeon: Leslye Peer, MD;  Location: [facility] ENDOSCOPY;  Service: Pulmonary;  Laterality: N/A;   FLEXIBLE BRONCHOSCOPY  10/25/2020   Procedure: FLEXIBLE BRONCHOSCOPY;  Surgeon: Leslye Peer, MD;  Location: [facility] ENDOSCOPY;  Service: Pulmonary;;   IABP INSERTION N/A 02/03/2018   Procedure: IABP Insertion;  Surgeon: Swaziland, Peter M, MD;  Location: [facility] INVASIVE CV LAB;  Service: Cardiovascular;  Laterality: N/A;    INTRAOPERATIVE TRANSESOPHAGEAL ECHOCARDIOGRAM N/A 02/03/2018   Procedure: INTRAOPERATIVE TRANSESOPHAGEAL ECHOCARDIOGRAM;  Surgeon: Purcell Nails, MD;  Location: [facility] OR;  Service: Open Heart Surgery;  Laterality: N/A;   LEFT HEART CATH AND CORONARY ANGIOGRAPHY N/A 02/03/2018   Procedure: LEFT HEART CATH AND CORONARY ANGIOGRAPHY;  Surgeon: Swaziland, Peter M, MD;  Location: [facility] INVASIVE CV LAB;  Service: Cardiovascular;  Laterality: N/A;   LEFT HEART CATH AND CORS/GRAFTS ANGIOGRAPHY N/A 02/12/2019   Procedure: LEFT HEART CATH AND CORS/GRAFTS ANGIOGRAPHY;  Surgeon: Iran Ouch, MD;  Location: [facility] INVASIVE CV LAB;  Service: Cardiovascular;  Laterality: N/A;   MEDIASTINOSCOPY N/A 03/11/2014   Procedure: MEDIASTINOSCOPY;  Surgeon: Loreli Slot, MD;  Location: [facility] OR;  Service: Thoracic;  Laterality: N/A;   PLACEMENT OF IMPELLA LEFT VENTRICULAR ASSIST DEVICE  02/03/2018   Procedure: PLACEMENT OF IMPELLA LEFT VENTRICULAR ASSIST DEVICE LD;  Surgeon: Purcell Nails, MD;  Location: [facility] OR;  Service: Open Heart Surgery;;   REMOVAL OF IMPELLA LEFT VENTRICULAR ASSIST DEVICE N/A 02/08/2018   Procedure: REMOVAL OF IMPELLA LEFT VENTRICULAR ASSIST DEVICE;  Surgeon: Purcell Nails, MD;  Location: [facility] OR;  Service: Open Heart Surgery;  Laterality: N/A;   RIGHT HEART CATH N/A 02/03/2018   Procedure: RIGHT HEART CATH;  Surgeon: Swaziland, Peter M, MD;  Location: [facility] INVASIVE CV LAB;  Service: Cardiovascular;  Laterality: N/A;   RIGHT HEART CATH N/A 05/09/2018   Procedure: RIGHT HEART CATH;  Surgeon: Dolores Patty, MD;  Location: [facility] INVASIVE CV LAB;  Service: Cardiovascular;  Laterality: N/A;   TEE WITHOUT CARDIOVERSION N/A 02/08/2018   Procedure: TRANSESOPHAGEAL ECHOCARDIOGRAM (TEE);  Surgeon: Purcell Nails, MD;  Location: [facility] OR;  Service: Open Heart Surgery;  Laterality: N/A;   TUBAL LIGATION     VIDEO BRONCHOSCOPY WITH ENDOBRONCHIAL ULTRASOUND N/A 03/11/2014   Procedure: VIDEO BRONCHOSCOPY WITH ENDOBRONCHIAL  ULTRASOUND;  Surgeon: Loreli Slot, MD;  Location: [facility] OR;  Service: Thoracic;  Laterality: N/A;   Current Outpatient Medications  Medication Sig Dispense Refill   albuterol (VENTOLIN HFA) 108 (90 Base) MCG/ACT inhaler Inhale 2 puffs into the lungs every 6 (six) hours as needed for wheezing or shortness of breath. 8.5 g 1   apixaban (ELIQUIS) 2.5 MG TABS tablet Take 1 tablet (2.5 mg total) by mouth 2 (two) times daily. 180 tablet 1   atorvastatin (LIPITOR) 80 MG tablet Take 1 tablet (80 mg total) by mouth daily. 90 tablet 0   blood glucose meter kit and supplies KIT Dispense based on patient and insurance preference. Use up to four times daily as directed. 1 each 0   dapagliflozin propanediol (FARXIGA) 10 MG TABS tablet Take 1 tablet by mouth daily. 30 tablet 11   digoxin (LANOXIN) 0.125 MG tablet TAKE  1 TABLET BY MOUTH DAILY 90 tablet 3   Fluticasone-Umeclidin-Vilant (TRELEGY ELLIPTA) 200-62.5-25 MCG/ACT AEPB Inhale 1 puff into the lungs daily. 60 each 5   ipratropium-albuterol (DUONEB) 0.5-2.5 (3) MG/3ML SOLN Take 3 mLs by nebulization every 4 (four) hours as needed (for shortness of breath). 90 mL 3   ivabradine (CORLANOR) 7.5 MG TABS tablet Take 1 tablet (7.5 mg total) by mouth 2 (two) times daily with a meal. TAKE 1 TABLET (7.5 MG TOTAL) BY MOUTH 2 (TWO) TIMES DAILY WITH A MEAL. 180 tablet 1   metolazone (ZAROXOLYN) 2.5 MG tablet Take 1 tablet (2.5 mg total) by mouth as directed. 15 tablet 1   naphazoline-pheniramine (VISINE) 0.025-0.3 % ophthalmic solution Place 1 drop into both eyes 4 (four) times daily as needed for eye irritation.     nitroGLYCERIN (NITROSTAT) 0.4 MG SL tablet Place 1 tablet (0.4 mg total) under the tongue every 5 (five) minutes as needed for chest pain. 90 tablet 5   Nutritional Supplements (ENSURE ORIGINAL) LIQD Take 2 Bottles by mouth daily.     potassium chloride 20 MEQ TBCR Take 2 tablets (40 mEq total) by mouth daily. 10 tablet 0   spironolactone (ALDACTONE)  25 MG tablet Take 0.5 tablets (12.5 mg total) by mouth at bedtime. 45 tablet 3   torsemide (DEMADEX) 20 MG tablet Take 60 mg by mouth daily.     No current facility-administered medications for this encounter.    Allergies:   Lactose intolerance (gi) and Codeine   Social History:  The patient  reports that she quit smoking about 9 years ago. Her smoking use included cigarettes. She has a 20.00 pack-year smoking history. She has never used smokeless tobacco. She reports current alcohol use of about 5.0 standard drinks of alcohol per week. She reports current drug use. Drug: Marijuana.   Family History:  The patient's family history includes Cancer in her maternal grandmother; Diabetes in her paternal grandmother; Heart attack in her mother; Heart disease in her mother; Hypertension in her maternal grandmother and mother.   ROS:  Please see the history of present illness.   All other systems are personally reviewed and negative.    Recent Labs: 04/19/2023: TSH 0.640 05/02/2023: B Natriuretic Peptide 1,263.9 05/23/2023: ALT 15; BUN 62; Creatinine, Ser 1.50; Hemoglobin 14.3; Magnesium 2.8; Platelets 265; Potassium 2.5; Sodium 133  Personally reviewed   Wt Readings from Last 3 Encounters:  05/28/23 44.9 kg (99 lb)  05/23/23 42.6 kg (93 lb 14.7 oz)  05/23/23 42.6 kg (94 lb)   BP 104/72   Pulse 86   Wt 44.9 kg (99 lb)   SpO2 98%   BMI 20.00 kg/m   Physical Exam General:  NAD. No resp difficulty, walked into clinic, fatigued-appearing. HEENT: Normal Neck: Supple. JVP to jaw. Carotids 2+ bilat; no bruits. No lymphadenopathy or thryomegaly appreciated. Cor: PMI nondisplaced. Regular rate & rhythm. No rubs, gallops or murmurs. Lungs: Wheezes throughout. Abdomen: Soft, nontender, nondistended. No hepatosplenomegaly. No bruits or masses. Good bowel sounds. Extremities: No cyanosis, clubbing, rash, edema Neuro: Alert & oriented x 3, cranial nerves grossly intact. Moves all 4 extremities w/o  difficulty. Affect pleasant.  Device interrogation (personally reviewed): OptiVol up, thoracic impedence down, 1.2 hr/day activity, no AF or VT, 100% v-sensed  Assessment & Plan: 1. Chronic Systolic Heart Failure, due to ICM  - Echo 09/04/18 ([facility]) LVEF 20%, Moderate AI, Mild MR, Mild TR, Severe LAE, RV mildly decreased.  - Echo 02/12/19: EF 25-30%, RV mildly reduced -  Echo 11/21 EF 20-25% RV mild HK. - Echo 2022 EF < 20% RV normal - Echo 1/23 EF 20-25% RV ok  - Has Med[employer]onic single chamber ICD  - Echo 07/10/22: EF 20-25%, global hypokinesis, GIIDD, RV systolic function, mildly reduced. RV size normal. Mild MR, trivial TR, aortic valve w/ mild-mod regurg.  - Echo (5/24): EF 20-25%, LV with global HK, RV mildly reduced, moderate AI. - NYHA III, volume up by OptiVol.  - Increase spiro to 25 mg daily. - Increase torsemide to 60/40, increase KCL to 40/20. - Reinforced importance of taking extra 40 KCL when taking PRN metolazone. - Continue digoxin 0.125 mg daily. Dig level 0.5 (05/02/23) - Continue Farxiga 10 mg daily.  - Continue Corlanor 7.5 mg bid. - Failed Entresto due to hyperkalemia.   - No b-blocker due to intolerance with low output and bad COPD with frequent flares. - Not a candidate for advanced therapies with her degree of lung disease.   - Now with Barostim in place.  - BMET and BNP today, BMET in 1 week.   2. Chronic Hypoxic Respiratory Failure with COPD - Multifactorial, former smoker, h/o small cell lung cancer treated with chemo, chest XRT, COPD and HF - She has home O2 (Adapt) & BiPap. - Continue Trelegy and nebs - She is now followed by Pulmonary  3. CAD  - Hx of NSTEMI/STEMI s/p Emergent CABG 02/03/18: Cath with severe 2v CAD as above with severe LV dysfunction/ICM.  - s/p Emergent CABG 02/03/18.   - Had NSTEMI and now s/p LHC 02/12/19 with DES to the ostial ramus extending into the distal left main.  - No longer on brillinta or ASA due to bleeding, and need for [facility]. - No  chest pain.  - Continue hi-intensity statin.   4. COPD w/ current exacerbation - Recent exacerbations with + influenza a.  - Continue Trelegy. - Followed by Pulmonary. - Wheezes on exam today, I asked her to use her nebs when she gets home, may need steroids soon. She has PCP follow up in 2 days.   5. Recurrent pleural effusions s/p pleurodesis - Resolved.  - Recent CXR ok   6. PAF - CHA2DS2/VASc is at least 4. (CHF, Vasc disease, HTN, Female).   - She has history of Afib RVR in the past in the setting of her Lung CA.  - Previously on Xarelto but stopped due to hemoptysis.  - Started on Eliquis in 12/22 due to renal infarct. - No AF on device interrogation, as above - Continue Eliquis 2.5 mg bid. No bleeding issues.  7. 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 - Remains on Eliquis.     8. H/o SCLC:  - s/p treatment 2015. Lost to f/u since 04/2015.  - Chest CT 02/19/18 with mass-like consolidation in R hilum concerning for recurrent tumor.  - Repeat CT 03/27/2018 -No definite findings of locally recurrent tumor in the right lung. 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  - S/p thoracentesis 04/2018. Cytology negative for malignancy.  - CTA 11/19 stable scarring in the right hilum consistent with emphysema. Bronchoscopy on 11/15, no obvious source of bleeding - CT chest (5/24) soft tissue thickening along mediastinum but no discrete mass.  9. ? Kidney mass: - CT Chest (5/24) showed ill-defined area of enhancement of Lefty kidney, ? Infiltrative mass - She has CT abd/pelvis arranged 7/24.   Follow up in 2 months  with Dr. [name]. She has been managing her medications independently, she would like to pull back on paramedicine assistance. Will reach out to team.  Jacklynn Ganong, FNP  9:13 AM

## 2023-05-28 NOTE — Progress Notes (Signed)
Paramedicine Encounter   Patient ID: Brittany [name] , female,   DOB: 19-Nov-1961,61 y.o.,  MRN: [id number]   Met patient in clinic today with provider.  Time spent with patient 5 minutes  Ms. Washington advised she did not wish to have me present at her clinic visit today.   [name], EMT-Paramedic  [phone] 05/28/2023

## 2023-05-28 NOTE — Telephone Encounter (Signed)
Text reminder of pt's appointment this morning a Heart [facility] V[facility] @ 9:00.    [name], EMT-Paramedic [phone] 05/28/2023

## 2023-05-28 NOTE — Patient Instructions (Addendum)
Thank you for coming in today  If you had labs drawn today, any labs that are abnormal the clinic will call you No news is good news  Medications: Increase Spironolactone to 25 mg 1 tablet at bedtime INCREASE Torsemide to 60 mg every morning and 40 mg in the evening Increase Potassium to 40 meq in the morning and 20 meq in the evening   WHEN YOU TAKE YOU METOLAZONE PLEASE TAKE A EXTRA POTASSIUM 40 MEQ 4 PILLS   Follow up appointments:  Your physician recommends that you schedule a follow-up appointment in:  3 MONTHS With Dr. [name]    Do the following things EVERYDAY: Weigh yourself in the morning before breakfast. Write it down and keep it in a log. Take your medicines as prescribed Eat low salt foods--Limit salt (sodium) to 2000 mg per day.  Stay as active as you can everyday Limit all fluids for the day to less than 2 liters   At the [facility], you and your health needs are our priority. As part of our continuing mission to provide you with exceptional heart care, we have created designated Provider Care Teams. These Care Teams include your primary Cardiologist (physician) and Advanced Practice Providers (APPs- Physician Assistants and Nurse Practitioners) who all work together to provide you with the care you need, when you need it.   You may see any of the following providers on your designated Care Team at your next follow up: Dr Arvilla Meres Dr Marca Ancona Dr. Marcos Eke, NP Robbie Lis, [place] [facility] [place], [place] Brynda Peon, NP Karle Plumber, PharmD   Please be sure to bring in all your medications bottles to every appointment.    Thank you for choosing [facility]  If you have any questions or concerns before your next appointment please send [place] a message through [place] or call our office at [phone].    TO LEAVE A MESSAGE FOR THE NURSE SELECT OPTION 2,  PLEASE LEAVE A MESSAGE INCLUDING: YOUR NAME DATE OF BIRTH CALL BACK NUMBER REASON FOR CALL**this is important as we prioritize the call backs  YOU WILL RECEIVE A CALL BACK THE SAME DAY AS LONG AS YOU CALL BEFORE 4:00 PM

## 2023-05-29 ENCOUNTER — Telehealth (HOSPITAL_COMMUNITY): Payer: Self-pay

## 2023-05-29 NOTE — Progress Notes (Unsigned)
Subjective:    Brittany [name] - 62 y.o. female MRN [id number]  Date of birth: September 27, 1961  HPI  Brittany [name] is to establish care.   Current issues and/or concerns: Cards  Pulmo   ROS per HPI     Health Maintenance:  Health Maintenance Due  Topic Date Due   Medicare Annual Wellness (AWV)  Never done   COVID-19 Vaccine (1) Never done   FOOT EXAM  Never done   OPHTHALMOLOGY EXAM  Never done   Diabetic kidney evaluation - Urine ACR  Never done   Hepatitis C Screening  Never done   DTaP/Tdap/Td (1 - Tdap) Never done   Zoster Vaccines- Shingrix (1 of 2) Never done   PAP SMEAR-Modifier  Never done   Colonoscopy  Never done   MAMMOGRAM  01/12/2012     Past Medical History: Patient Active Problem List   Diagnosis Date Noted   Trichomonas infection 04/19/2023   Presence of heart assist device (HCC) 04/11/2023   COPD with acute exacerbation (HCC) 03/01/2023   CAD (coronary artery disease) 03/01/2023   Acute respiratory failure with hypoxia (HCC) 11/14/2022   Sepsis (HCC) 11/14/2022   Influenza A with pneumonia 11/14/2022   Troponin level elevated 11/14/2022   History of lung cancer 11/14/2022   Acute combined systolic and diastolic CHF, NYHA class 4 (HCC) 07/09/2022   Malnutrition of moderate degree 06/20/2022   Lactic acidosis 04/12/2022   Prolonged QT interval 04/09/2022   Hypotension 04/09/2022   Chronic HFrEF (heart failure with reduced ejection fraction) (HCC) 12/11/2021   CHF exacerbation (HCC) 12/10/2021   Renal infarct (HCC) 12/10/2021   COPD exacerbation (HCC) 09/29/2021   Mixed diabetic hyperlipidemia associated with type 2 diabetes mellitus (HCC) 09/29/2021   CKD (chronic kidney disease) stage 3, GFR 30-59 ml/min (HCC) 09/29/2021   Unstable angina (HCC) 02/09/2021   Hemoptysis 10/20/2020   Type 2 diabetes mellitus with stage 3a chronic kidney disease, without long-term current use of insulin (HCC) 10/20/2020   Acute on chronic combined  systolic and diastolic CHF (congestive heart failure) (HCC) 05/21/2020   Ischemic cardiomyopathy 04/12/2020   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   NSTEMI (non-ST elevated myocardial infarction) (HCC) 02/12/2019   CHF (congestive heart failure) (HCC) 11/24/2018   Recurrent pleural effusion on right 05/05/2018   Acute on chronic respiratory failure with hypoxia (HCC) 05/05/2018   Chronic respiratory failure with hypoxia (HCC)    Asthma    Anxiety    Allergy    HCAP (healthcare-associated pneumonia) 03/15/2015   Chest pain 03/15/2015   Small cell lung cancer (HCC) 03/16/2014   Protein-calorie malnutrition, severe (HCC) 03/14/2014   AF (paroxysmal atrial fibrillation) (HCC) 03/12/2014   Essential hypertension 05/07/2012      Social History   reports that she quit smoking about 9 years ago. Her smoking use included cigarettes. She has a 20.00 pack-year smoking history. She has never used smokeless tobacco. She reports current alcohol use of about 5.0 standard drinks of alcohol per week. She reports current drug use. Drug: Marijuana.   Family History  family history includes Cancer in her maternal grandmother; Diabetes in her paternal grandmother; Heart attack in her mother; Heart disease in her mother; Hypertension in her maternal grandmother and mother.   Medications: reviewed and updated   Objective:   Physical Exam There were no vitals taken for this visit. Physical Exam      Assessment & Plan:         Patient was given clear instructions  to go to Emergency Department or return to medical center if symptoms don't improve, worsen, or new problems develop.The patient verbalized understanding.  I discussed the assessment and treatment plan with the patient. The patient was provided an opportunity to ask questions and all were answered. The patient agreed with the plan and demonstrated an understanding of the instructions.   The patient was  advised to call back or seek an in-person evaluation if the symptoms worsen or if the condition fails to improve as anticipated.    Ricky Stabs, NP 05/29/2023, 8:28 AM Primary Care at [facility]

## 2023-05-29 NOTE — Telephone Encounter (Signed)
Reached out to pt in regards to multiple med changes from clinic visit yesterday to see if she was comfortable with adjusting those.  She reported she is good with making those changes on her own and she had enough meds to make the increased doses.  She denies needing any further assistance at this time.  I advised her to call if she does.   Kerry Hough, EMT-Paramedic  [phone] 05/29/2023

## 2023-05-30 ENCOUNTER — Encounter: Payer: Self-pay | Admitting: Family

## 2023-05-30 ENCOUNTER — Ambulatory Visit (INDEPENDENT_AMBULATORY_CARE_PROVIDER_SITE_OTHER): Payer: BC Managed Care – PPO | Admitting: Family

## 2023-05-30 VITALS — BP 98/62 | HR 81 | Resp 16 | Ht 59.0 in | Wt 95.8 lb

## 2023-05-30 DIAGNOSIS — R4589 Other symptoms and signs involving emotional state: Secondary | ICD-10-CM | POA: Diagnosis not present

## 2023-05-30 DIAGNOSIS — Z7689 Persons encountering health services in other specified circumstances: Secondary | ICD-10-CM | POA: Diagnosis not present

## 2023-05-30 MED ORDER — HYDROXYZINE PAMOATE 25 MG PO CAPS
25.0000 mg | ORAL_CAPSULE | Freq: Three times a day (TID) | ORAL | 1 refills | Status: DC | PRN
Start: 2023-05-30 — End: 2023-05-30

## 2023-05-30 MED ORDER — HYDROXYZINE PAMOATE 25 MG PO CAPS
25.0000 mg | ORAL_CAPSULE | Freq: Every day | ORAL | 1 refills | Status: DC | PRN
Start: 2023-05-30 — End: 2023-07-30

## 2023-05-30 NOTE — Patient Instructions (Signed)
Thank you for choosing Primary Care at [facility] for your medical home!    Brittany [name] was seen by Rema Fendt, NP today.   Meda Coffee primary care provider is Ricky Stabs, NP.   For the best care possible,  you should try to see Ricky Stabs, NP whenever you come to office.   We look forward to seeing you again soon!  If you have any questions about your visit today,  please call us at [phone]  Or feel free to reach your provider via MyChart.     Keeping you healthy   Get these tests Blood pressure- Have your blood pressure checked once a year by your healthcare provider.  Normal blood pressure is 120/80. Weight- Have your body mass index (BMI) calculated to screen for obesity.  BMI is a measure of body fat based on height and weight. You can also calculate your own BMI at https://www.west-esparza.com/. Cholesterol- Have your cholesterol checked regularly starting at age 66, sooner may be necessary if you have diabetes, high blood pressure, if a family member developed heart diseases at an early age or if you smoke.  Chlamydia, HIV, and other sexual transmitted disease- Get screened each year until the age of 25 then within three months of each new sexual partner. Diabetes- Have your blood sugar checked regularly if you have high blood pressure, high cholesterol, a family history of diabetes or if you are overweight.   Get these vaccines Flu shot- Every fall. Tetanus shot- Every 10 years. Menactra- Single dose; prevents meningitis.   Take these steps Don't smoke- If you do smoke, ask your healthcare provider about quitting. For tips on how to quit, go to www.smokefree.gov or call 1-800-QUIT-NOW. Be physically active- Exercise 5 days a week for at least 30 minutes.  If you are not already physically active start slow and gradually work up to 30 minutes of moderate physical activity.  Examples of moderate activity include walking briskly, mowing the  yard, dancing, swimming bicycling, etc. Eat a healthy diet- Eat a variety of healthy foods such as fruits, vegetables, low fat milk, low fat cheese, yogurt, lean meats, poultry, fish, beans, tofu, etc.  For more information on healthy eating, go to www.thenutritionsource.org Drink alcohol in moderation- Limit alcohol intake two drinks or less a day.  Never drink and drive. Dentist- Brush and floss teeth twice daily; visit your dentis twice a year. Depression-Your emotional health is as important as your physical health.  If you're feeling down, losing interest in things you normally enjoy please talk with your healthcare provider. Gun Safety- If you keep a gun in your home, keep it unloaded and with the safety lock on.  Bullets should be stored separately. Helmet use- Always wear a helmet when riding a motorcycle, bicycle, rollerblading or skateboarding. Safe sex- If you may be exposed to a sexually transmitted infection, use a condom Seat belts- Seat bels can save your life; always wear one. Smoke/Carbon Monoxide detectors- These detectors need to be installed on the appropriate level of your home.  Replace batteries at least once a year. Skin Cancer- When out in the sun, cover up and use sunscreen SPF 15 or higher. Violence- If anyone is threatening or hurting you, please tell your healthcare provider.

## 2023-06-04 ENCOUNTER — Other Ambulatory Visit (HOSPITAL_COMMUNITY): Payer: BC Managed Care – PPO

## 2023-06-04 ENCOUNTER — Ambulatory Visit: Payer: BC Managed Care – PPO | Attending: Cardiology

## 2023-06-04 DIAGNOSIS — Z95810 Presence of automatic (implantable) cardiac defibrillator: Secondary | ICD-10-CM | POA: Diagnosis not present

## 2023-06-04 DIAGNOSIS — I5022 Chronic systolic (congestive) heart failure: Secondary | ICD-10-CM

## 2023-06-04 NOTE — Progress Notes (Unsigned)
EPIC Encounter for ICM Monitoring  Patient Name: Brittany [name] is a 62 y.o. female Date: 06/04/2023 Primary Care Physican: Pcp, No Primary Cardiologist: Bensimhon Electrophysiologist: Lalla Brothers 02/13/2023 Weight: 100-102 lbs 03/20/2023 Weight: 100 lbs 05/22/2023 Weight: 96 lbs   Spoke with patient and heart failure questions reviewed.  Transmission results reviewed.  Pt reports she is asymptomatic for fluid accumulation today.   She did take extra Torsemide this AM, 6/24 due to eating at restaurant over the weekend.  She is revamping her diet and eating more fruits and vegetables.    Optivol Thoracic impedance suggesting possible fluid accumulation started 6/12, returned to baseline 6/19 and possible fluid accumulation restarted 6/20.      Prescribed: Torsemide 20 mg take 3 tablets (60 mg total) by mouth in the morning and 2 tablets (40 mg total) every evening. Potassium 20 mEq take 2 tablets (40 mEq total) by mouth every morning 1 tablet (20 mEq total) every evening Spironolactone 25 mg take 1 tablet (25 mg total) by mouth at bedtime Metolazone 2.5 mg take 1 tablet by mouth as needed   Labs: 05/28/2023 Creatinine 1.27, BUN 29, Potassium 3.5, Sodium 137, GFR 48 05/23/2023 Creatinine 1.50, BUN 62, Potassium 2.5, Sodium 133, GFR 39 (3:24 PM) ED visit for Hypokalemia 05/23/2023 Creatinine 1.58, BUN 59, Potassium 2.6, Sodium 133, GFR 37 (12:46 PM) 05/14/2023 Creatinine 1.46, BUN 21, Potassium 3.6, Sodium 137, GFR 40 05/02/2023 Creatinine 1.95, BUN 27, Potassium 3.8, Sodium 137, GFR 29 04/22/2023 Creatinine 1.38, BUN 28, Potassium 5.3, Sodium 133, GFR 43  04/21/2023 Creatinine 1.51, BUN 26, Potassium 4.9, Sodium 133, GFR 39  04/20/2023 Creatinine 1.63, BUN 27, Potassium 4.5, Sodium 132, GFR 35  04/19/2023 Creatinine 1.63, BUN 28, Potassium 4.3, Sodium 133, GFR 35 04/18/2023 Creatinine 1.46, BUN 27, Potassium 5.3, Sodium 135, GFR 40  03/15/2023 Creatinine 1.14, BUN 36, Potassium 3.3,  Sodium 140, GFR 54 03/07/2023 Creatinine 1.29, BUN 34, Potassium 4.5, Sodium 136, GFR 47 03/03/2023 Creatinine 1.31, BUN 34, Potassium 3.8, Sodium 134, GFR 46 01/29/2023 Creatinine 1.14, BUN 21, Potassium 4.1, Sodium 136, GFR 54 01/15/2023 Creatinine 1.37, BUN 19, Potassium 3.4, Sodium 135, GFR 44 12/19/2022 Creatinine 1.28, BUN 14, Potassium 3.4, Sodium 138, GFR 47 (resulting in an increase in K+ dosage) A complete set of results can be found in Results Review.   Recommendations:   Patient took Extra Torsemide this morning which was after the remote transmission was sent.  Copy sent to [name], NP for review.      Follow-up plan: ICM clinic phone appointment on 06/11/2023 to recheck fluid levels.   91 day device clinic remote transmission 06/20/2023.     EP/Cardiology Office Visits:  08/01/2023 with Dr [name].      Copy of ICM check sent to Dr. Lalla Brothers.   3 month ICM trend: 06/04/2023.    12-14 Month ICM trend:     Karie Soda, RN 06/04/2023 2:43 PM

## 2023-06-05 NOTE — Progress Notes (Signed)
  Received: Arlyce Harman, Anderson Malta, FNP  Lanaya Bennis, Josephine Igo, RN Thank you Jacki Cones

## 2023-06-05 NOTE — Progress Notes (Signed)
Spoke with patient and advised [name], NP and no changes.

## 2023-06-08 ENCOUNTER — Telehealth (HOSPITAL_COMMUNITY): Payer: Self-pay | Admitting: Emergency Medicine

## 2023-06-08 NOTE — Telephone Encounter (Signed)
Patient is now discharged from [employer] [facility].  Patient has/has not met the following goals:  Yes :Patient expresses basic understanding of medications and what they are for Yes :Patient able to verbalize heart failure specific dietary/fluid restrictions Yes :Patient is aware of who to call if they have medical concerns or if they need to schedule or change appts Yes :Patient has a scale for daily weights and weighs regularly Yes :Patient able to verbalize concerning symptoms when they should call the HF clinic (weight gain ranges, etc) No :Patient has a PCP and has seen within the past year or has upcoming appt Yes :Patient has reliable access to getting their medications Yes :Patient has shown they are able to reorder medications reliably Yes :Patient has had admission in past 30 days- if yes how many? Yes :Patient has had admission in past 90 days- if yes how many?  Discharge Comments: At patient's last clinic visit she advised that she was "getting overwhelmed" with so many doctor visits and did not wish me to stay  at visit.   Per Tonye Becket it is ok to discharge her at this time.   Called Ms. Washington to advise her of discharge from the Paramedicine program and advised her to reach out should she have further needs.    [name], EMT-Paramedic [phone] 06/08/2023

## 2023-06-11 ENCOUNTER — Ambulatory Visit: Payer: BC Managed Care – PPO | Attending: Cardiology

## 2023-06-11 DIAGNOSIS — Z95810 Presence of automatic (implantable) cardiac defibrillator: Secondary | ICD-10-CM

## 2023-06-11 DIAGNOSIS — I5022 Chronic systolic (congestive) heart failure: Secondary | ICD-10-CM

## 2023-06-11 NOTE — Progress Notes (Signed)
EPIC Encounter for ICM Monitoring  Patient Name: Brittany [name] is a 62 y.o. female Date: 06/11/2023 Primary Care Physican: Pcp, No Primary Cardiologist: Bensimhon Electrophysiologist: Lalla Brothers 02/13/2023 Weight: 100-102 lbs 03/20/2023 Weight: 100 lbs 05/22/2023 Weight: 96 lbs   Spoke with patient and heart failure questions reviewed.  Transmission results reviewed.  Pt reports she is asymptomatic for fluid accumulation.   She reports she has not been taking her afternoon Torsemide dosage as prescribed.   Optivol Thoracic impedance suggesting possible fluid accumulation starting 6/20.      Prescribed: Torsemide 20 mg take 3 tablets (60 mg total) by mouth in the morning and 2 tablets (40 mg total) every evening. Potassium 20 mEq take 2 tablets (40 mEq total) by mouth every morning 1 tablet (20 mEq total) every evening Spironolactone 25 mg take 1 tablet (25 mg total) by mouth at bedtime Metolazone 2.5 mg take 1 tablet by mouth as needed   Labs: 05/28/2023 Creatinine 1.27, BUN 29, Potassium 3.5, Sodium 137, GFR 48 05/23/2023 Creatinine 1.50, BUN 62, Potassium 2.5, Sodium 133, GFR 39 (3:24 PM) ED visit for Hypokalemia 05/23/2023 Creatinine 1.58, BUN 59, Potassium 2.6, Sodium 133, GFR 37 (12:46 PM) 05/14/2023 Creatinine 1.46, BUN 21, Potassium 3.6, Sodium 137, GFR 40 05/02/2023 Creatinine 1.95, BUN 27, Potassium 3.8, Sodium 137, GFR 29 04/22/2023 Creatinine 1.38, BUN 28, Potassium 5.3, Sodium 133, GFR 43  04/21/2023 Creatinine 1.51, BUN 26, Potassium 4.9, Sodium 133, GFR 39  04/20/2023 Creatinine 1.63, BUN 27, Potassium 4.5, Sodium 132, GFR 35  04/19/2023 Creatinine 1.63, BUN 28, Potassium 4.3, Sodium 133, GFR 35 04/18/2023 Creatinine 1.46, BUN 27, Potassium 5.3, Sodium 135, GFR 40  03/15/2023 Creatinine 1.14, BUN 36, Potassium 3.3, Sodium 140, GFR 54 03/07/2023 Creatinine 1.29, BUN 34, Potassium 4.5, Sodium 136, GFR 47 03/03/2023 Creatinine 1.31, BUN 34, Potassium 3.8, Sodium 134, GFR  46 01/29/2023 Creatinine 1.14, BUN 21, Potassium 4.1, Sodium 136, GFR 54 01/15/2023 Creatinine 1.37, BUN 19, Potassium 3.4, Sodium 135, GFR 44 12/19/2022 Creatinine 1.28, BUN 14, Potassium 3.4, Sodium 138, GFR 47 (resulting in an increase in K+ dosage) A complete set of results can be found in Results Review.   Recommendations:   Advised to take Torsemide as prescribed and reviewed current prescription and to call if she experiences any fluid symptoms.    Follow-up plan: ICM clinic phone appointment on 06/18/2023 (manual) to recheck fluid levels.   91 day device clinic remote transmission 06/20/2023.     EP/Cardiology Office Visits:  08/01/2023 with Dr [name].      Copy of ICM check sent to Dr. Lalla Brothers.   3 month ICM trend: 06/11/2023.    12-14 Month ICM trend:     Karie Soda, RN 06/11/2023 2:19 PM

## 2023-06-12 ENCOUNTER — Encounter: Payer: Self-pay | Admitting: Internal Medicine

## 2023-06-13 ENCOUNTER — Ambulatory Visit (HOSPITAL_BASED_OUTPATIENT_CLINIC_OR_DEPARTMENT_OTHER): Payer: BC Managed Care – PPO

## 2023-06-18 ENCOUNTER — Ambulatory Visit: Payer: BC Managed Care – PPO | Admitting: Pulmonary Disease

## 2023-06-19 ENCOUNTER — Ambulatory Visit (INDEPENDENT_AMBULATORY_CARE_PROVIDER_SITE_OTHER): Payer: BC Managed Care – PPO

## 2023-06-19 ENCOUNTER — Ambulatory Visit: Payer: BC Managed Care – PPO

## 2023-06-19 DIAGNOSIS — Z95810 Presence of automatic (implantable) cardiac defibrillator: Secondary | ICD-10-CM

## 2023-06-19 DIAGNOSIS — I5022 Chronic systolic (congestive) heart failure: Secondary | ICD-10-CM

## 2023-06-19 NOTE — Progress Notes (Signed)
Spoke with patient and advised Brittany Rome, NP instructed her to take PRN metolazone 2.5 mg + extra 40 KCL x 1 today.   Patient verbalizes understanding of dosages to take for the evening and agrees with plan.

## 2023-06-19 NOTE — Progress Notes (Signed)
Attempted call to patient and unable to reach. No answer.

## 2023-06-19 NOTE — Progress Notes (Signed)
  Received: Today [name], Anderson Malta, FNP  Jaz Mallick, Josephine Igo, RN Please have her take her PRN metolazone 2.5 mg + extra 40 KCL x 1 today. Thanks!

## 2023-06-19 NOTE — Progress Notes (Signed)
EPIC Encounter for ICM Monitoring  Patient Name: Brittany [name] is a 62 y.o. female Date: 06/19/2023 Primary Care Physican: Pcp, No Primary Cardiologist: Bensimhon Electrophysiologist: Lalla Brothers 02/13/2023 Weight: 100-102 lbs 03/20/2023 Weight: 100 lbs 05/22/2023 Weight: 96 lbs   Spoke with patient and heart failure questions reviewed.  Transmission results reviewed.  Pt feels the tight band around chest which is her sign for fluid accumulation but she is starting to feel better today compared to the last few days.      Optivol Thoracic impedance suggesting possible fluid accumulation starting 6/20     Prescribed: Torsemide 20 mg take 3 tablets (60 mg total) by mouth in the morning and 2 tablets (40 mg total) every evening. Potassium 20 mEq take 2 tablets (40 mEq total) by mouth every morning 1 tablet (20 mEq total) every evening Spironolactone 25 mg take 1 tablet (25 mg total) by mouth at bedtime Metolazone 2.5 mg take 1 tablet by mouth as needed   Labs: 05/28/2023 Creatinine 1.27, BUN 29, Potassium 3.5, Sodium 137, GFR 48 05/23/2023 Creatinine 1.50, BUN 62, Potassium 2.5, Sodium 133, GFR 39 (3:24 PM) ED visit for Hypokalemia 05/23/2023 Creatinine 1.58, BUN 59, Potassium 2.6, Sodium 133, GFR 37 (12:46 PM) 05/14/2023 Creatinine 1.46, BUN 21, Potassium 3.6, Sodium 137, GFR 40 05/02/2023 Creatinine 1.95, BUN 27, Potassium 3.8, Sodium 137, GFR 29 04/22/2023 Creatinine 1.38, BUN 28, Potassium 5.3, Sodium 133, GFR 43  04/21/2023 Creatinine 1.51, BUN 26, Potassium 4.9, Sodium 133, GFR 39  04/20/2023 Creatinine 1.63, BUN 27, Potassium 4.5, Sodium 132, GFR 35  04/19/2023 Creatinine 1.63, BUN 28, Potassium 4.3, Sodium 133, GFR 35 04/18/2023 Creatinine 1.46, BUN 27, Potassium 5.3, Sodium 135, GFR 40  03/15/2023 Creatinine 1.14, BUN 36, Potassium 3.3, Sodium 140, GFR 54 03/07/2023 Creatinine 1.29, BUN 34, Potassium 4.5, Sodium 136, GFR 47 03/03/2023 Creatinine 1.31, BUN 34, Potassium 3.8,  Sodium 134, GFR 46 01/29/2023 Creatinine 1.14, BUN 21, Potassium 4.1, Sodium 136, GFR 54 01/15/2023 Creatinine 1.37, BUN 19, Potassium 3.4, Sodium 135, GFR 44 12/19/2022 Creatinine 1.28, BUN 14, Potassium 3.4, Sodium 138, GFR 47 (resulting in an increase in K+ dosage) A complete set of results can be found in Results Review.   Recommendations:   She will take 60 mg Furosemide this afternoon with 1 extra Potassium.  She will evaluate tomorrow if she needs to take a Metolazone but will do so based on symptoms.  Copy sent to [name], NP for FYI and review.      Follow-up plan: ICM clinic phone appointment on 06/26/2023 to recheck fluid levels.   91 day device clinic remote transmission 06/20/2023.     EP/Cardiology Office Visits:  08/01/2023 with Dr [name].      Copy of ICM check sent to Dr. Lalla Brothers.   3 month ICM trend: 06/19/2023.    12-14 Month ICM trend:     Karie Soda, RN 06/19/2023 2:17 PM

## 2023-06-20 ENCOUNTER — Ambulatory Visit: Payer: BC Managed Care – PPO | Admitting: Pulmonary Disease

## 2023-06-20 ENCOUNTER — Ambulatory Visit (INDEPENDENT_AMBULATORY_CARE_PROVIDER_SITE_OTHER): Payer: BC Managed Care – PPO

## 2023-06-20 DIAGNOSIS — I5022 Chronic systolic (congestive) heart failure: Secondary | ICD-10-CM

## 2023-06-20 LAB — CUP PACEART REMOTE DEVICE CHECK
Battery Remaining Longevity: 89 mo
Battery Voltage: 3 V
Brady Statistic RV Percent Paced: 0.02 %
Date Time Interrogation Session: 20240710022706
HighPow Impedance: 63 Ohm
Implantable Lead Connection Status: 753985
Implantable Lead Implant Date: 20190905
Implantable Lead Location: 753860
Implantable Pulse Generator Implant Date: 20190905
Lead Channel Impedance Value: 342 Ohm
Lead Channel Impedance Value: 437 Ohm
Lead Channel Pacing Threshold Amplitude: 0.5 V
Lead Channel Pacing Threshold Pulse Width: 0.4 ms
Lead Channel Sensing Intrinsic Amplitude: 21.75 mV
Lead Channel Sensing Intrinsic Amplitude: 21.75 mV
Lead Channel Setting Pacing Amplitude: 2 V
Lead Channel Setting Pacing Pulse Width: 0.4 ms
Lead Channel Setting Sensing Sensitivity: 0.3 mV
Zone Setting Status: 755011
Zone Setting Status: 755011

## 2023-06-26 ENCOUNTER — Ambulatory Visit (HOSPITAL_COMMUNITY)
Admission: RE | Admit: 2023-06-26 | Discharge: 2023-06-26 | Disposition: A | Discharge status: Home / Self Care | Payer: BC Managed Care – PPO | Source: Ambulatory Visit | Attending: Pulmonary Disease | Admitting: Pulmonary Disease

## 2023-06-26 ENCOUNTER — Other Ambulatory Visit: Payer: Self-pay | Admitting: Pulmonary Disease

## 2023-06-26 ENCOUNTER — Ambulatory Visit: Payer: BC Managed Care – PPO

## 2023-06-26 DIAGNOSIS — R93422 Abnormal radiologic findings on diagnostic imaging of left kidney: Secondary | ICD-10-CM | POA: Insufficient documentation

## 2023-06-26 DIAGNOSIS — Z95810 Presence of automatic (implantable) cardiac defibrillator: Secondary | ICD-10-CM | POA: Insufficient documentation

## 2023-06-26 DIAGNOSIS — I5022 Chronic systolic (congestive) heart failure: Secondary | ICD-10-CM | POA: Diagnosis present

## 2023-06-26 DIAGNOSIS — C3490 Malignant neoplasm of unspecified part of unspecified bronchus or lung: Secondary | ICD-10-CM

## 2023-06-26 MED ORDER — IOHEXOL 350 MG/ML SOLN
75.0000 mL | Freq: Once | INTRAVENOUS | Status: AC | PRN
  Administered 2023-06-26: 75 mL via INTRAVENOUS

## 2023-06-26 NOTE — Progress Notes (Unsigned)
EPIC Encounter for ICM Monitoring  Patient Name: Brittany [name] is a 62 y.o. female Date: 06/26/2023 Primary Care Physican: Pcp, No Primary Cardiologist: Bensimhon Electrophysiologist: Lalla Brothers 02/13/2023 Weight: 100-102 lbs 03/20/2023 Weight: 100 lbs 05/22/2023 Weight: 96 lbs 06/26/2023 Weight: 96-98 lbs   Spoke with patient and heart failure questions reviewed.  Transmission results reviewed.  Pt feels heaviness in the chest area which is usually her indication that she has fluid.     Optivol Thoracic impedance suggesting fluid levels showed 2 days of slight improvement after taking Metolazone on 7/9 but returned to previous level of possible fluid accumulation.     Prescribed: Torsemide 20 mg take 3 tablets (60 mg total) by mouth in the morning and 2 tablets (40 mg total) every evening. Potassium 20 mEq take 2 tablets (40 mEq total) by mouth every morning 1 tablet (20 mEq total) every evening Spironolactone 25 mg take 1 tablet (25 mg total) by mouth at bedtime Metolazone 2.5 mg take 1 tablet by mouth as needed   Labs: 05/28/2023 Creatinine 1.27, BUN 29, Potassium 3.5, Sodium 137, GFR 48 05/23/2023 Creatinine 1.50, BUN 62, Potassium 2.5, Sodium 133, GFR 39 (3:24 PM) ED visit for Hypokalemia 05/23/2023 Creatinine 1.58, BUN 59, Potassium 2.6, Sodium 133, GFR 37 (12:46 PM) 05/14/2023 Creatinine 1.46, BUN 21, Potassium 3.6, Sodium 137, GFR 40 05/02/2023 Creatinine 1.95, BUN 27, Potassium 3.8, Sodium 137, GFR 29 04/22/2023 Creatinine 1.38, BUN 28, Potassium 5.3, Sodium 133, GFR 43  04/21/2023 Creatinine 1.51, BUN 26, Potassium 4.9, Sodium 133, GFR 39  04/20/2023 Creatinine 1.63, BUN 27, Potassium 4.5, Sodium 132, GFR 35  04/19/2023 Creatinine 1.63, BUN 28, Potassium 4.3, Sodium 133, GFR 35 04/18/2023 Creatinine 1.46, BUN 27, Potassium 5.3, Sodium 135, GFR 40  03/15/2023 Creatinine 1.14, BUN 36, Potassium 3.3, Sodium 140, GFR 54 03/07/2023 Creatinine 1.29, BUN 34, Potassium 4.5, Sodium  136, GFR 47 03/03/2023 Creatinine 1.31, BUN 34, Potassium 3.8, Sodium 134, GFR 46 01/29/2023 Creatinine 1.14, BUN 21, Potassium 4.1, Sodium 136, GFR 54 01/15/2023 Creatinine 1.37, BUN 19, Potassium 3.4, Sodium 135, GFR 44 12/19/2022 Creatinine 1.28, BUN 14, Potassium 3.4, Sodium 138, GFR 47 (resulting in an increase in K+ dosage) A complete set of results can be found in Results Review.   Recommendations:  Copy sent to [name], NP at [facility] clinic for review.     Follow-up plan: ICM clinic phone appointment on 07/02/2023 (manual) to recheck fluid levels.   91 day device clinic remote transmission 09/19/2023.     EP/Cardiology Office Visits:  08/01/2023 with Dr [name].      Copy of ICM check sent to Dr. Lalla Brothers.    3 month ICM trend: 06/26/2023.    12-14 Month ICM trend:     Karie Soda, RN 06/26/2023 4:18 PM

## 2023-06-27 MED ORDER — TORSEMIDE 20 MG PO TABS: 80.0000 mg | ORAL_TABLET | Freq: Two times a day (BID) | ORAL | Status: DC

## 2023-06-27 MED ORDER — POTASSIUM CHLORIDE ER 10 MEQ PO TBCR: 40.0000 meq | EXTENDED_RELEASE_TABLET | Freq: Two times a day (BID) | ORAL | Status: DC

## 2023-06-27 MED ORDER — METOLAZONE 2.5 MG PO TABS: 2.5000 mg | ORAL_TABLET | ORAL | 1 refills | Status: DC

## 2023-06-27 NOTE — Progress Notes (Signed)
  Received: Arlyce Harman, Anderson Malta, FNP  Bibiana Gillean, Josephine Igo, RN Let's increase torsemide to 80 mg bid and add metolazone 2.5 mg (with extra 40 KCL) once weekly (on Wednesdays).  Increase KCL to 40 bid (extra 40 on metolazone days). She will need a BMET in 7-10 days please

## 2023-06-27 NOTE — Progress Notes (Signed)
Spoke with patient and scheduled BMET for 1 week, 7/24.   She is not going to take Metolazone this week and may start it next week.  She will take it if needed.

## 2023-06-27 NOTE — Progress Notes (Signed)
Spoke with patient and advised of Brittany Rome, NP recommendation to increase torsemide to 80 mg bid and add metolazone 2.5 mg (with extra 40 KCL) once weekly (on Wednesdays).  Also needs to Increase KCL to 40 bid (extra 40 on metolazone days).  She requested Potassium 10 mEq tablets due to easier to swallow.  Will need to schedule a BMET in 7-10 days.   Pt did not sleep well last night due to SOB.  She is concerned that increasing Torsemide dosage and adding Metolazone once a week will have negative effect on her kidneys.  Advised having labs completed in 1 week will provide information on how the kidneys are effected by the change in medication.  She is reluctant to increase dosage but will do so.   She has enough medication on hand and does not need refills at this time. She knows to call pharmacy when refills are needed.  Will need to call back this afternoon to schedule labs due to she needs to look at her calendar.   Also advised she can schedule HF clinic appt before seeing Dr [name] in August if she is concerned with the new med dosages and current condition.  She verbalized understanding.

## 2023-06-29 ENCOUNTER — Telehealth: Payer: Self-pay | Admitting: Pulmonary Disease

## 2023-06-29 DIAGNOSIS — C3490 Malignant neoplasm of unspecified part of unspecified bronchus or lung: Secondary | ICD-10-CM

## 2023-06-29 NOTE — Telephone Encounter (Signed)
Pt. Calling I nto get CT result told her [facility] is taking longer wait time but if we could call her back as soon as they come back in with results is very stresed

## 2023-07-02 ENCOUNTER — Ambulatory Visit: Payer: BC Managed Care – PPO | Attending: Cardiology

## 2023-07-02 DIAGNOSIS — Z95810 Presence of automatic (implantable) cardiac defibrillator: Secondary | ICD-10-CM

## 2023-07-02 DIAGNOSIS — I5022 Chronic systolic (congestive) heart failure: Secondary | ICD-10-CM

## 2023-07-03 ENCOUNTER — Telehealth: Payer: Self-pay

## 2023-07-03 NOTE — Progress Notes (Unsigned)
Erroneous encounter-disregard

## 2023-07-03 NOTE — Telephone Encounter (Signed)
Spoke with patient and requested to send manual remote transmission to recheck fluid levels.

## 2023-07-03 NOTE — Progress Notes (Unsigned)
EPIC Encounter for ICM Monitoring  Patient Name: Brittany [name] is a 62 y.o. female Date: 07/03/2023 Primary Care Physican: Pcp, No Primary Cardiologist: Bensimhon Electrophysiologist: Lalla Brothers 02/13/2023 Weight: 100-102 lbs 03/20/2023 Weight: 100 lbs 05/22/2023 Weight: 96 lbs 06/26/2023 Weight: 96-98 lbs   Spoke with patient and heart failure questions reviewed.  Transmission results reviewed.  Pt feels continues to feel heaviness in the chest area along with SOB but has declince to take the full prescribed dosage of Torsemide as recommended.  She chose to start Metolazone 7/24 instead of last week.       Optivol Thoracic impedance suggesting fluid levels remain unchanged after taking Torsemide 80 mg AM and 40 mg PM.  She will start weekly Metolazone tomorrow, 7/24.     Prescribed: Torsemide 20 mg take 4 tablets (80 mg total) by mouth twice a day. Potassium 10 mEq take 4 tablets (40 mEq total) by mouth twice a day.  Take extra 4 tablets (40 mEq total) with weekly Metolazone.   Spironolactone 25 mg take 1 tablet (25 mg total) by mouth at bedtime Metolazone 2.5 mg take 1 tablet by mouth every week on Wednesday.     Labs: 07/04/2024 BMET Scheduled 05/28/2023 Creatinine 1.27, BUN 29, Potassium 3.5, Sodium 137, GFR 48 05/23/2023 Creatinine 1.50, BUN 62, Potassium 2.5, Sodium 133, GFR 39 (3:24 PM) ED visit for Hypokalemia 05/23/2023 Creatinine 1.58, BUN 59, Potassium 2.6, Sodium 133, GFR 37 (12:46 PM) 05/14/2023 Creatinine 1.46, BUN 21, Potassium 3.6, Sodium 137, GFR 40 05/02/2023 Creatinine 1.95, BUN 27, Potassium 3.8, Sodium 137, GFR 29 04/22/2023 Creatinine 1.38, BUN 28, Potassium 5.3, Sodium 133, GFR 43  04/21/2023 Creatinine 1.51, BUN 26, Potassium 4.9, Sodium 133, GFR 39  04/20/2023 Creatinine 1.63, BUN 27, Potassium 4.5, Sodium 132, GFR 35  04/19/2023 Creatinine 1.63, BUN 28, Potassium 4.3, Sodium 133, GFR 35 04/18/2023 Creatinine 1.46, BUN 27, Potassium 5.3, Sodium 135, GFR 40   03/15/2023 Creatinine 1.14, BUN 36, Potassium 3.3, Sodium 140, GFR 54 03/07/2023 Creatinine 1.29, BUN 34, Potassium 4.5, Sodium 136, GFR 47 03/03/2023 Creatinine 1.31, BUN 34, Potassium 3.8, Sodium 134, GFR 46 01/29/2023 Creatinine 1.14, BUN 21, Potassium 4.1, Sodium 136, GFR 54 01/15/2023 Creatinine 1.37, BUN 19, Potassium 3.4, Sodium 135, GFR 44 12/19/2022 Creatinine 1.28, BUN 14, Potassium 3.4, Sodium 138, GFR 47 (resulting in an increase in K+ dosage) A complete set of results can be found in Results Review.   Recommendations:  Shanda Bumps, she has decided to take Torsemide 80 mg AM and 40 mg PM.  She starts weekly Metolazone tomorrow, 7/24.  She is concerned with taking so much medication and how it is affecting her kidneys.    BMET will be drawn on 7/25.    Copy sent to [name], NP at [facility] clinic for review.      Follow-up plan: ICM clinic phone appointment on 07/11/2023 to recheck fluid levels.   91 day device clinic remote transmission 09/19/2023.     EP/Cardiology Office Visits:  08/01/2023 with Dr [name].   Message sent to EP scheduler to make OV with Dr Lalla Brothers.   Copy of ICM check sent to Dr. Lalla Brothers.    3 month ICM trend: 07/03/2023.    12-14 Month ICM trend:     Karie Soda, RN 07/03/2023 3:34 PM

## 2023-07-04 ENCOUNTER — Encounter: Payer: BC Managed Care – PPO | Admitting: Family

## 2023-07-04 ENCOUNTER — Other Ambulatory Visit (HOSPITAL_COMMUNITY): Payer: BC Managed Care – PPO

## 2023-07-04 DIAGNOSIS — R4589 Other symptoms and signs involving emotional state: Secondary | ICD-10-CM

## 2023-07-04 NOTE — Progress Notes (Signed)
Spoke with patient and advised of [name], NP recommendations.  She verbalized understanding.

## 2023-07-04 NOTE — Telephone Encounter (Signed)
Please advise pt. CT results have now come back

## 2023-07-04 NOTE — Progress Notes (Signed)
  Received: Today [place], Anderson Malta, FNP  Meigan Pates, Josephine Igo, RN Thanks Jacki Cones, strongly suggest she continue on the higher dose of torsemide and take the metolazone weekly as suggested.

## 2023-07-04 NOTE — Telephone Encounter (Signed)
Patient called and wanted her CT result's from 06/26/23  Dr.Olalere can you please advise .  Thank you

## 2023-07-05 ENCOUNTER — Other Ambulatory Visit (HOSPITAL_COMMUNITY): Payer: BC Managed Care – PPO

## 2023-07-11 ENCOUNTER — Telehealth: Payer: Self-pay

## 2023-07-11 ENCOUNTER — Ambulatory Visit: Payer: BC Managed Care – PPO | Attending: Cardiology

## 2023-07-11 DIAGNOSIS — Z95810 Presence of automatic (implantable) cardiac defibrillator: Secondary | ICD-10-CM | POA: Diagnosis not present

## 2023-07-11 DIAGNOSIS — I5022 Chronic systolic (congestive) heart failure: Secondary | ICD-10-CM | POA: Diagnosis not present

## 2023-07-11 NOTE — Telephone Encounter (Signed)
Remote ICM transmission received.  Attempted call to patient regarding ICM remote transmission and no answer or voice mail option.  

## 2023-07-11 NOTE — Telephone Encounter (Signed)
Pt. Calling back again wants her CT results and wants to know if she has cancer please advise

## 2023-07-11 NOTE — Progress Notes (Signed)
Remote ICD transmission.   

## 2023-07-11 NOTE — Telephone Encounter (Signed)
Patient calling for CT result's.  Dr.Olalere can you please advise .   Thank you

## 2023-07-11 NOTE — Progress Notes (Signed)
Spoke with patient and heart failure questions reviewed.  Transmission results reviewed.  Pt reports breathing is good and denies any fluid symptoms.  She is taking Torsemide 80 mg AM and 40 mg PM instead of 80 mg twice a day as prescribed.  She is due to take Metolazone today, 7/31 but has not taken it yet.  She reports she has no life if she takes all the diuretics because she has to be close to a bathroom all day.  She thinks Dr Leory Plowman should be able to evaluate and come up with different plan to help manage the fluid and her condition that will work better for her than the regimen she has now.  Encouraged to take Torsemide 80 mg bid if she develops fluid symptoms and also the weekly metolazone.

## 2023-07-11 NOTE — Progress Notes (Addendum)
EPIC Encounter for ICM Monitoring  Patient Name: Brittany [name] is a 62 y.o. female Date: 07/11/2023 Primary Care Physican: Pcp, No Primary Cardiologist: Bensimhon Electrophysiologist: Lalla Brothers 02/13/2023 Weight: 100-102 lbs 03/20/2023 Weight: 100 lbs 05/22/2023 Weight: 96 lbs 06/26/2023 Weight: 96-98 lbs   Attempted call to patient and unable to reach.  Transmission reviewed.     Optivol Thoracic impedance suggesting ongoing possible fluid accumulation but did show 1 day of improvement on 7/24.     Prescribed: Torsemide 20 mg take 4 tablets (80 mg total) by mouth twice a day.   07/02/23 Pt reports not taking as prescribed.  She is taking 80 mg/40 mg  Potassium 10 mEq take 4 tablets (40 mEq total) by mouth twice a day.  Take extra 4 tablets (40 mEq total) with weekly Metolazone.   Spironolactone 25 mg take 1 tablet (25 mg total) by mouth at bedtime Metolazone 2.5 mg take 1 tablet by mouth every week on Wednesday.  (Started 7/23)   Labs: 07/04/2024 BMET Scheduled but was not drawn.  Appt shows canceled 05/28/2023 Creatinine 1.27, BUN 29, Potassium 3.5, Sodium 137, GFR 48 05/23/2023 Creatinine 1.50, BUN 62, Potassium 2.5, Sodium 133, GFR 39 (3:24 PM) ED visit for Hypokalemia 05/23/2023 Creatinine 1.58, BUN 59, Potassium 2.6, Sodium 133, GFR 37 (12:46 PM) 05/14/2023 Creatinine 1.46, BUN 21, Potassium 3.6, Sodium 137, GFR 40 05/02/2023 Creatinine 1.95, BUN 27, Potassium 3.8, Sodium 137, GFR 29 04/22/2023 Creatinine 1.38, BUN 28, Potassium 5.3, Sodium 133, GFR 43  04/21/2023 Creatinine 1.51, BUN 26, Potassium 4.9, Sodium 133, GFR 39  04/20/2023 Creatinine 1.63, BUN 27, Potassium 4.5, Sodium 132, GFR 35  04/19/2023 Creatinine 1.63, BUN 28, Potassium 4.3, Sodium 133, GFR 35 04/18/2023 Creatinine 1.46, BUN 27, Potassium 5.3, Sodium 135, GFR 40  03/15/2023 Creatinine 1.14, BUN 36, Potassium 3.3, Sodium 140, GFR 54 03/07/2023 Creatinine 1.29, BUN 34, Potassium 4.5, Sodium 136, GFR  47 03/03/2023 Creatinine 1.31, BUN 34, Potassium 3.8, Sodium 134, GFR 46 01/29/2023 Creatinine 1.14, BUN 21, Potassium 4.1, Sodium 136, GFR 54 01/15/2023 Creatinine 1.37, BUN 19, Potassium 3.4, Sodium 135, GFR 44 12/19/2022 Creatinine 1.28, BUN 14, Potassium 3.4, Sodium 138, GFR 47 (resulting in an increase in K+ dosage) A complete set of results can be found in Results Review.   Recommendations:  Unable to reach.     Follow-up plan: ICM clinic phone appointment on 07/25/2023 to recheck fluid levels.   91 day device clinic remote transmission 09/19/2023.     EP/Cardiology Office Visits:  08/01/2023 with Dr [name].   10/10/2023 with Dr Lalla Brothers.   Copy of ICM check sent to Dr. Lalla Brothers.    3 month ICM trend: 07/11/2023.    12-14 Month ICM trend:     Karie Soda, RN 07/11/2023 7:58 AM

## 2023-07-12 ENCOUNTER — Telehealth: Payer: Self-pay | Admitting: Pulmonary Disease

## 2023-07-12 ENCOUNTER — Other Ambulatory Visit: Payer: Self-pay | Admitting: Pulmonary Disease

## 2023-07-12 ENCOUNTER — Encounter: Payer: Self-pay | Admitting: Pulmonary Disease

## 2023-07-12 DIAGNOSIS — N280 Ischemia and infarction of kidney: Secondary | ICD-10-CM

## 2023-07-12 NOTE — Telephone Encounter (Signed)
Brittany Lightning, MD  to Brittany [name]      07/12/23  8:45 AM No evidence of cancer   Evidence of renal infarction-looks like the CT scan showed a previous area of infection previously as well -Renal infarction means an area where there was decreased blood supply with subsequent injury   Will make a referral to a kidney doctor if that is okay with you  I called and spoke with the pt and notified of results/recs per Dr Wynona Neat  She verbalized understanding. Referral to renal was placed.

## 2023-07-12 NOTE — Telephone Encounter (Signed)
I did send patient a message regarding the CT  Please make a referral to renal for abnormal CT showing evidence of renal infarct

## 2023-07-12 NOTE — Telephone Encounter (Signed)
Order has been placed to the Kidney doctor per Dr.Olalere  referral to renal for abnormal CT showing evidence of renal infarct   Patient has been notified .   Nothing else further needed.

## 2023-07-12 NOTE — Progress Notes (Signed)
Remote ICD transmission.   

## 2023-07-17 ENCOUNTER — Other Ambulatory Visit: Payer: BC Managed Care – PPO

## 2023-07-20 ENCOUNTER — Ambulatory Visit: Payer: BC Managed Care – PPO | Admitting: Pulmonary Disease

## 2023-07-28 ENCOUNTER — Other Ambulatory Visit: Payer: Self-pay | Admitting: Family

## 2023-07-28 DIAGNOSIS — R4589 Other symptoms and signs involving emotional state: Secondary | ICD-10-CM

## 2023-07-30 ENCOUNTER — Other Ambulatory Visit: Payer: Self-pay

## 2023-07-30 DIAGNOSIS — R4589 Other symptoms and signs involving emotional state: Secondary | ICD-10-CM

## 2023-07-30 MED ORDER — HYDROXYZINE PAMOATE 25 MG PO CAPS
25.0000 mg | ORAL_CAPSULE | Freq: Every day | ORAL | 1 refills | Status: DC | PRN
Start: 2023-07-30 — End: 2023-08-06

## 2023-07-30 NOTE — Telephone Encounter (Signed)
Rx refill sent in @10 :12am 07/30/2023

## 2023-07-30 NOTE — Progress Notes (Signed)
No ICM remote transmission received for 07/23/2023 and next ICM transmission scheduled for 08/14/2023.

## 2023-08-01 ENCOUNTER — Inpatient Hospital Stay (HOSPITAL_COMMUNITY)
Admission: RE | Admit: 2023-08-01 | Payer: BC Managed Care – PPO | Source: Ambulatory Visit | Admitting: Internal Medicine

## 2023-08-03 ENCOUNTER — Emergency Department (HOSPITAL_COMMUNITY): Payer: BC Managed Care – PPO

## 2023-08-03 ENCOUNTER — Inpatient Hospital Stay (HOSPITAL_COMMUNITY)
Admission: EM | Admit: 2023-08-03 | Discharge: 2023-08-06 | DRG: 190 | Disposition: A | Discharge status: Home / Self Care | Payer: BC Managed Care – PPO | Attending: Family Medicine | Admitting: Family Medicine

## 2023-08-03 ENCOUNTER — Other Ambulatory Visit: Payer: Self-pay

## 2023-08-03 ENCOUNTER — Encounter (HOSPITAL_COMMUNITY): Payer: Self-pay

## 2023-08-03 DIAGNOSIS — E1122 Type 2 diabetes mellitus with diabetic chronic kidney disease: Secondary | ICD-10-CM | POA: Diagnosis present

## 2023-08-03 DIAGNOSIS — I480 Paroxysmal atrial fibrillation: Secondary | ICD-10-CM | POA: Diagnosis present

## 2023-08-03 DIAGNOSIS — Z955 Presence of coronary angioplasty implant and graft: Secondary | ICD-10-CM

## 2023-08-03 DIAGNOSIS — N1832 Chronic kidney disease, stage 3b: Secondary | ICD-10-CM | POA: Diagnosis not present

## 2023-08-03 DIAGNOSIS — Z1152 Encounter for screening for COVID-19: Secondary | ICD-10-CM | POA: Diagnosis not present

## 2023-08-03 DIAGNOSIS — E875 Hyperkalemia: Secondary | ICD-10-CM | POA: Diagnosis not present

## 2023-08-03 DIAGNOSIS — Z7901 Long term (current) use of anticoagulants: Secondary | ICD-10-CM | POA: Diagnosis not present

## 2023-08-03 DIAGNOSIS — Z95810 Presence of automatic (implantable) cardiac defibrillator: Secondary | ICD-10-CM | POA: Diagnosis not present

## 2023-08-03 DIAGNOSIS — I509 Heart failure, unspecified: Secondary | ICD-10-CM | POA: Diagnosis present

## 2023-08-03 DIAGNOSIS — I2510 Atherosclerotic heart disease of native coronary artery without angina pectoris: Secondary | ICD-10-CM | POA: Diagnosis present

## 2023-08-03 DIAGNOSIS — Z66 Do not resuscitate: Secondary | ICD-10-CM | POA: Diagnosis present

## 2023-08-03 DIAGNOSIS — I5023 Acute on chronic systolic (congestive) heart failure: Principal | ICD-10-CM

## 2023-08-03 DIAGNOSIS — E1169 Type 2 diabetes mellitus with other specified complication: Secondary | ICD-10-CM | POA: Diagnosis present

## 2023-08-03 DIAGNOSIS — E119 Type 2 diabetes mellitus without complications: Secondary | ICD-10-CM | POA: Diagnosis present

## 2023-08-03 DIAGNOSIS — Z7984 Long term (current) use of oral hypoglycemic drugs: Secondary | ICD-10-CM

## 2023-08-03 DIAGNOSIS — N179 Acute kidney failure, unspecified: Secondary | ICD-10-CM | POA: Diagnosis not present

## 2023-08-03 DIAGNOSIS — I252 Old myocardial infarction: Secondary | ICD-10-CM

## 2023-08-03 DIAGNOSIS — R9431 Abnormal electrocardiogram [ECG] [EKG]: Secondary | ICD-10-CM | POA: Diagnosis present

## 2023-08-03 DIAGNOSIS — Z85118 Personal history of other malignant neoplasm of bronchus and lung: Secondary | ICD-10-CM

## 2023-08-03 DIAGNOSIS — I340 Nonrheumatic mitral (valve) insufficiency: Secondary | ICD-10-CM | POA: Diagnosis present

## 2023-08-03 DIAGNOSIS — Z87448 Personal history of other diseases of urinary system: Secondary | ICD-10-CM

## 2023-08-03 DIAGNOSIS — J9601 Acute respiratory failure with hypoxia: Secondary | ICD-10-CM | POA: Diagnosis not present

## 2023-08-03 DIAGNOSIS — J439 Emphysema, unspecified: Secondary | ICD-10-CM | POA: Diagnosis present

## 2023-08-03 DIAGNOSIS — Z87891 Personal history of nicotine dependence: Secondary | ICD-10-CM | POA: Diagnosis not present

## 2023-08-03 DIAGNOSIS — I5022 Chronic systolic (congestive) heart failure: Secondary | ICD-10-CM | POA: Diagnosis present

## 2023-08-03 DIAGNOSIS — Z923 Personal history of irradiation: Secondary | ICD-10-CM

## 2023-08-03 DIAGNOSIS — J9621 Acute and chronic respiratory failure with hypoxia: Secondary | ICD-10-CM | POA: Insufficient documentation

## 2023-08-03 DIAGNOSIS — Z885 Allergy status to narcotic agent status: Secondary | ICD-10-CM

## 2023-08-03 DIAGNOSIS — Z8249 Family history of ischemic heart disease and other diseases of the circulatory system: Secondary | ICD-10-CM

## 2023-08-03 DIAGNOSIS — Z79899 Other long term (current) drug therapy: Secondary | ICD-10-CM

## 2023-08-03 DIAGNOSIS — N1831 Chronic kidney disease, stage 3a: Secondary | ICD-10-CM | POA: Diagnosis present

## 2023-08-03 DIAGNOSIS — Z5986 Financial insecurity: Secondary | ICD-10-CM

## 2023-08-03 DIAGNOSIS — Z7951 Long term (current) use of inhaled steroids: Secondary | ICD-10-CM

## 2023-08-03 DIAGNOSIS — I130 Hypertensive heart and chronic kidney disease with heart failure and stage 1 through stage 4 chronic kidney disease, or unspecified chronic kidney disease: Secondary | ICD-10-CM | POA: Diagnosis present

## 2023-08-03 DIAGNOSIS — J441 Chronic obstructive pulmonary disease with (acute) exacerbation: Principal | ICD-10-CM | POA: Diagnosis present

## 2023-08-03 DIAGNOSIS — J9620 Acute and chronic respiratory failure, unspecified whether with hypoxia or hypercapnia: Secondary | ICD-10-CM | POA: Diagnosis present

## 2023-08-03 DIAGNOSIS — Z951 Presence of aortocoronary bypass graft: Secondary | ICD-10-CM | POA: Diagnosis not present

## 2023-08-03 DIAGNOSIS — Z8701 Personal history of pneumonia (recurrent): Secondary | ICD-10-CM

## 2023-08-03 DIAGNOSIS — I2489 Other forms of acute ischemic heart disease: Secondary | ICD-10-CM | POA: Diagnosis present

## 2023-08-03 DIAGNOSIS — I5043 Acute on chronic combined systolic (congestive) and diastolic (congestive) heart failure: Secondary | ICD-10-CM | POA: Diagnosis not present

## 2023-08-03 DIAGNOSIS — Z833 Family history of diabetes mellitus: Secondary | ICD-10-CM

## 2023-08-03 DIAGNOSIS — Z91011 Allergy to milk products: Secondary | ICD-10-CM

## 2023-08-03 LAB — CBC WITH DIFFERENTIAL/PLATELET
Abs Immature Granulocytes: 0.02 10*3/uL (ref 0.00–0.07)
Basophils Absolute: 0.1 10*3/uL (ref 0.0–0.1)
Basophils Relative: 1 %
Eosinophils Absolute: 0.1 10*3/uL (ref 0.0–0.5)
Eosinophils Relative: 1 %
HCT: 44.4 % (ref 36.0–46.0)
Hemoglobin: 14.2 g/dL (ref 12.0–15.0)
Immature Granulocytes: 0 %
Lymphocytes Relative: 10 %
Lymphs Abs: 0.9 10*3/uL (ref 0.7–4.0)
MCH: 29.6 pg (ref 26.0–34.0)
MCHC: 32 g/dL (ref 30.0–36.0)
MCV: 92.5 fL (ref 80.0–100.0)
Monocytes Absolute: 0.8 10*3/uL (ref 0.1–1.0)
Monocytes Relative: 8 %
Neutro Abs: 7.7 10*3/uL (ref 1.7–7.7)
Neutrophils Relative %: 80 %
Platelets: 207 10*3/uL (ref 150–400)
RBC: 4.8 MIL/uL (ref 3.87–5.11)
RDW: 17.9 % — ABNORMAL HIGH (ref 11.5–15.5)
WBC: 9.6 10*3/uL (ref 4.0–10.5)
nRBC: 0 % (ref 0.0–0.2)

## 2023-08-03 LAB — BASIC METABOLIC PANEL
Anion gap: 11 (ref 5–15)
BUN: 35 mg/dL — ABNORMAL HIGH (ref 8–23)
CO2: 26 mmol/L (ref 22–32)
Calcium: 9.6 mg/dL (ref 8.9–10.3)
Chloride: 101 mmol/L (ref 98–111)
Creatinine, Ser: 1.21 mg/dL — ABNORMAL HIGH (ref 0.44–1.00)
GFR, Estimated: 51 mL/min — ABNORMAL LOW (ref >60)
Glucose, Bld: 130 mg/dL — ABNORMAL HIGH (ref 70–99)
Potassium: 3.6 mmol/L (ref 3.5–5.1)
Sodium: 138 mmol/L (ref 135–145)

## 2023-08-03 LAB — I-STAT CHEM 8, ED
BUN: 36 mg/dL — ABNORMAL HIGH (ref 8–23)
Calcium, Ion: 1.15 mmol/L (ref 1.15–1.40)
Chloride: 105 mmol/L (ref 98–111)
Creatinine, Ser: 1.2 mg/dL — ABNORMAL HIGH (ref 0.44–1.00)
Glucose, Bld: 127 mg/dL — ABNORMAL HIGH (ref 70–99)
HCT: 46 % (ref 36.0–46.0)
Hemoglobin: 15.6 g/dL — ABNORMAL HIGH (ref 12.0–15.0)
Potassium: 3.6 mmol/L (ref 3.5–5.1)
Sodium: 141 mmol/L (ref 135–145)
TCO2: 24 mmol/L (ref 22–32)

## 2023-08-03 LAB — DIGOXIN LEVEL: Digoxin Level: 0.3 ng/mL — ABNORMAL LOW (ref 0.8–2.0)

## 2023-08-03 LAB — MAGNESIUM: Magnesium: 2 mg/dL (ref 1.7–2.4)

## 2023-08-03 LAB — BRAIN NATRIURETIC PEPTIDE: B Natriuretic Peptide: 2209.9 pg/mL — ABNORMAL HIGH (ref 0.0–100.0)

## 2023-08-03 LAB — TROPONIN I (HIGH SENSITIVITY): Troponin I (High Sensitivity): 41 ng/L — ABNORMAL HIGH (ref <18)

## 2023-08-03 LAB — CBG MONITORING, ED: Glucose-Capillary: 139 mg/dL — ABNORMAL HIGH (ref 70–99)

## 2023-08-03 LAB — TSH: TSH: 0.787 u[IU]/mL (ref 0.350–4.500)

## 2023-08-03 LAB — SARS CORONAVIRUS 2 BY RT PCR: SARS Coronavirus 2 by RT PCR: NEGATIVE

## 2023-08-03 MED ORDER — ACETAMINOPHEN 325 MG PO TABS
650.0000 mg | ORAL_TABLET | Freq: Four times a day (QID) | ORAL | Status: DC | PRN
  Administered 2023-08-04: 650 mg via ORAL
  Filled 2023-08-03: qty 2

## 2023-08-03 MED ORDER — DIGOXIN 125 MCG PO TABS
125.0000 ug | ORAL_TABLET | Freq: Every day | ORAL | Status: DC
  Administered 2023-08-03 – 2023-08-06 (×4): 125 ug via ORAL
  Filled 2023-08-03 (×4): qty 1

## 2023-08-03 MED ORDER — ONDANSETRON HCL 4 MG/2ML IJ SOLN
INTRAMUSCULAR | Status: AC
  Filled 2023-08-03: qty 2

## 2023-08-03 MED ORDER — POTASSIUM CHLORIDE CRYS ER 20 MEQ PO TBCR
40.0000 meq | EXTENDED_RELEASE_TABLET | ORAL | Status: AC
  Administered 2023-08-04 (×2): 40 meq via ORAL
  Filled 2023-08-03 (×2): qty 2

## 2023-08-03 MED ORDER — NAPHAZOLINE-PHENIRAMINE 0.025-0.3 % OP SOLN: 1.0000 [drp] | Freq: Four times a day (QID) | OPHTHALMIC | Status: DC | PRN

## 2023-08-03 MED ORDER — ACETAMINOPHEN 650 MG RE SUPP: 650.0000 mg | Freq: Four times a day (QID) | RECTAL | Status: DC | PRN

## 2023-08-03 MED ORDER — APIXABAN 2.5 MG PO TABS
2.5000 mg | ORAL_TABLET | Freq: Two times a day (BID) | ORAL | Status: DC
  Administered 2023-08-03 – 2023-08-06 (×6): 2.5 mg via ORAL
  Filled 2023-08-03 (×6): qty 1

## 2023-08-03 MED ORDER — SPIRONOLACTONE 25 MG PO TABS
25.0000 mg | ORAL_TABLET | Freq: Every day | ORAL | Status: DC
  Administered 2023-08-03 – 2023-08-05 (×3): 25 mg via ORAL
  Filled 2023-08-03: qty 1
  Filled 2023-08-03: qty 2
  Filled 2023-08-03: qty 1

## 2023-08-03 MED ORDER — FUROSEMIDE 10 MG/ML IJ SOLN
80.0000 mg | Freq: Once | INTRAMUSCULAR | Status: AC
  Administered 2023-08-03: 80 mg via INTRAVENOUS
  Filled 2023-08-03: qty 8

## 2023-08-03 MED ORDER — UMECLIDINIUM BROMIDE 62.5 MCG/ACT IN AEPB
1.0000 | INHALATION_SPRAY | Freq: Every day | RESPIRATORY_TRACT | Status: DC
  Administered 2023-08-04 – 2023-08-06 (×3): 1 via RESPIRATORY_TRACT
  Filled 2023-08-03: qty 7

## 2023-08-03 MED ORDER — HYDROXYZINE HCL 10 MG PO TABS: 25.0000 mg | ORAL_TABLET | Freq: Every day | ORAL | Status: DC | PRN

## 2023-08-03 MED ORDER — ATORVASTATIN CALCIUM 80 MG PO TABS
80.0000 mg | ORAL_TABLET | Freq: Every day | ORAL | Status: DC
  Administered 2023-08-03 – 2023-08-06 (×4): 80 mg via ORAL
  Filled 2023-08-03 (×2): qty 1
  Filled 2023-08-03: qty 2
  Filled 2023-08-03: qty 1

## 2023-08-03 MED ORDER — DAPAGLIFLOZIN PROPANEDIOL 10 MG PO TABS
10.0000 mg | ORAL_TABLET | Freq: Every day | ORAL | Status: DC
  Administered 2023-08-04 – 2023-08-06 (×3): 10 mg via ORAL
  Filled 2023-08-03 (×3): qty 1

## 2023-08-03 MED ORDER — FLUTICASONE FUROATE-VILANTEROL 200-25 MCG/ACT IN AEPB
1.0000 | INHALATION_SPRAY | Freq: Every day | RESPIRATORY_TRACT | Status: DC
  Administered 2023-08-04 – 2023-08-06 (×3): 1 via RESPIRATORY_TRACT
  Filled 2023-08-03: qty 28

## 2023-08-03 NOTE — ED Notes (Signed)
Pt

## 2023-08-03 NOTE — Hospital Course (Addendum)
Brittany [name] is a 62 y.o.female with a history of HFrEF (EF 20-25%), HTN, T2DM, CAD s/p emergent CABG 2019, COPD w/ h/o HAP, small cell lung cancer, and Afib who was admitted to the [facility] Medicine Teaching Service at [facility] for suspected decompensated heart failure in addition to COPD exacerbation. Her hospital course is detailed below:  Acute on chronic respiratory failure Presented with 2 to 3 days of dyspnea, cough, and left-sided chest tightness.  Patient is not on oxygen at home but required 2 L of O2 and then BiPAP in the ED.  CXR with cardiomegaly and pulmonary edema.  BNP elevated to 2209.  Negative COVID.  Shortness of breath improved with BiPAP, however initial diuresis with 80mg  Lasix x2 was unsuccessful in promoting sufficient output.  Cardiology/HF team consulted and placed PICC for CVP/co-oximetry while diuresing with Lasix and addition of metolazone. They felt this was not CHF exacerbation, more related to COPD. Prednisone 40mg  was started 08/04/23 for 5 day course. After diuresis, breztri, and duonebs, pt's work of breathing improved significantly. Pt discharged with two more days of prednisone. Dextromethorphan-guaifenesin for cough as needed, and methocarbamol as needed for muscle spasms related to coughing.   AKI Creatinine admission was 1.21, likely near baseline.  However with IV diuresis, did trend up to 1.9 by discharge.  Recommended patient stay 1 more night with diuretics held to see if creatinine trended down, however patient decided to go home with close outpatient follow-up.  States that her PCP has an appointment as early as tomorrow.  She will hold her torsemide tonight and tomorrow morning.  T2DM Pt had some elevated BGLs while on prednisone. Home Farxiga 10mg  was continued along with sensitive SSI.   Prolonged QT interval QTc on initial EKG was 521 ms, used caution with QT prolonging medications, including avoiding azithromycin for possible COPD  exacerbation.  Other chronic conditions were medically managed with home medications and formulary alternatives as necessary (A-fib, COPD, T2DM)  PCP Follow-up Recommendations: Repeat BMP to evaluate for resolution of AKI, monitor electrolytes status post diuresis. Evaluate BGLs to ensure they came back down after prednisone course completed The number for [facility] was provided given imaging hx of renal infarct

## 2023-08-03 NOTE — Assessment & Plan Note (Addendum)
Glucose 127 in ED. Last A1C 3 months ago was 7.3 -Continue home Farxiga 10 mg -CBGs

## 2023-08-03 NOTE — ED Notes (Signed)
Patient reports ems brought her purse phone and laptop but it is not in the room.  Called Shift commander and he reached out to unit that transported patient and they reported her cellphone was placed in her purse but they never brought the bag out of the hotel room where she was picked up.

## 2023-08-03 NOTE — Assessment & Plan Note (Signed)
Shortness of breath x2-3 days requiring BiPAP in the ED.  Chest x-ray with cardiomegaly and pulmonary edema.  BNP 2209.  Symptoms consistent with previous CHF exacerbations. -Progressive, inpatient status -S/p Lasix 80 mg x 2 -Cardiac telemetry while diuresing -K>4, Mg>2 -Holding home torsemide - Holding home ivabradine for now while she receives high dose loop diuretics given her prolonged QTc. "Loop diuretics increase the arrhythmogenic properties of Ivabradine by preventing its metabolism and causing electrolyte imbalances." Laveda Norman et al. Tillman Sers 2019) -AM BMP, mag, TSH -Strict I's and O's, daily weights -NPO while on BiPAP, then can have regular diet -Continue spironolactone 25 mg at bedtime

## 2023-08-03 NOTE — Assessment & Plan Note (Addendum)
Shortness of breath x2-3 days requiring BiPAP in the ED.  Chest x-ray with cardiomegaly and pulmonary edema.  BNP 2209.  Symptoms consistent with previous CHF exacerbations. -Progressive, inpatient status -S/p Lasix 80 mg x 2 -Cardiac telemetry while diuresing -Holding home torsemide - Holding home ivabradine for now while she receives high dose loop diuretics given her prolonged QTc. "Loop diuretics increase the arrhythmogenic properties of Ivabradine by preventing its metabolism and causing electrolyte imbalances." Laveda Norman et al. Tillman Sers 2019) -AM BMP, mag, TSH -Strict I's and O's, daily weights -NPO while on BiPAP, then can have regular diet -Continue spironolactone 25 mg at bedtime

## 2023-08-03 NOTE — Assessment & Plan Note (Signed)
Qtc - caution with QT-prolonging medications

## 2023-08-03 NOTE — H&P (Cosign Needed Addendum)
Hospital Admission History and Physical Service Pager: [phone]  Patient name: Brittany [name] Medical record number: [id number] Date of Birth: 04-Nov-1961 Age: 62 y.o. Gender: female  Primary Care Provider: Georganna Skeans, MD Consultants: none Code Status: DNR - pt does not want intubation, compressions, or shock Preferred Emergency Contact: Burnis Kingfisher (sister) [phone], Barbara Cower (friend/SO) [phone]  Chief Complaint: shortness of breath, CHF exacerbation  Assessment and Plan: Brittany [name] is a 62 y.o. female presenting with shortness of breath and increased WOB requiring BiPAP for respiratory support. Differential for presentation of this includes acute on chronic CHF exacerbation, respiratory viral illness, pneumonia, ACS, PE, A-fib with RVR.  Suspect this patient's shortness of breath to be secondary to acute exacerbation of CHF.  Patient has an EF of 20 to 25% and history of similar presentations, patient does appear to be volume overloaded.  This could have potentially been caused by a viral illness, COVID swab in the ED was negative.  Chest x-ray with cardiomegaly and pulmonary edema, no acute infiltrate.  Patient is afebrile.  No acute changes appear to be on EKG.  Low suspicion for PE as patient takes Eliquis.  Heart rate has been normal since arrival.  [facility]     * (Principal) Acute exacerbation of CHF (congestive heart failure)  (HCC)     Shortness of breath x2-3 days requiring BiPAP in the ED.  Chest x-ray  with cardiomegaly and pulmonary edema.  BNP 2209.  Symptoms consistent  with previous CHF exacerbations. -Progressive, inpatient status -S/p Lasix 80 mg x 2 -Cardiac telemetry while diuresing -Holding home torsemide - Holding home ivabradine for now while she receives high dose loop  diuretics given her prolonged QTc. "Loop diuretics increase the  arrhythmogenic properties of Ivabradine by preventing its metabolism and   causing electrolyte imbalances." Laveda Norman et al. Tillman Sers 2019) -AM BMP, mag, TSH -Strict I's and O's, daily weights -NPO while on BiPAP, then can have regular diet -Continue spironolactone 25 mg at bedtime        AF (paroxysmal atrial fibrillation) (HCC)     Not currently in A-fib -Continue Eliquis 2.5 mg twice daily -Continue digoxin -On cardiac telemetry        Type 2 diabetes mellitus with stage 3a chronic kidney disease, without  long-term current use of insulin (HCC)     Glucose 127 in ED. Last A1C 3 months ago was 7.3 -Continue home Farxiga 10 mg -CBGs        Prolonged QT interval     Qtc - caution with QT-prolonging medications       Chronic and Stable Problems:  T2DM - farxiga 10mg  every day Asthma - trelegy inhaler  Afib: digoxin every day and eliquis 2.5mg  BID  FEN/GI: regular VTE Prophylaxis: on eliquis 2.5mg  BID  Disposition: progressive, inpatient status  History of Present Illness:  Brittany [name] is a 62 y.o. female presenting with shortness of breath for 2 to 3 days.  States she feels like she has increased fluid in her lungs, consistent with previous CHF exacerbations.  Patient also notes left-sided chest pain mainly located under her breast and a cough.  Denies any recent changes to her medications, and states that she has been taking her medications as prescribed. No sick contacts.  No fevers.  Patient has history of CHF, last EF was 20 to 25%.  In the ED, patient required 2L O2 initially, patient is not on home O2. Patient's  shortness of breath and lung sounds progressively worsened while in the ED, requiring BiPAP.  Patient's symptoms did improve with BiPAP.  Patient also given 80 mg of IV Lasix x2.  Chest x-ray showed cardiomegaly with pulmonary edema, blunting of costophrenic angles. BNP noted to be 2,209.9.   On chart review, noted evidence of renal infarction on previous CT scan.  Patient was referred to nephrology but it  does not appear that she followed up with them.  No concerning symptoms reported today.  Review Of Systems: Per HPI with the following additions: None  Pertinent Past Medical History: Small cell lung cancer (2015) in remission HTN   Remainder reviewed in history tab.   Pertinent Past Surgical History: ICD placed 2019 CABG 2019 Coronary stent 2020  Remainder reviewed in history tab.   Pertinent Social History: Tobacco use: former, 20 pack years Alcohol use: few glasses wine/week Other Substance use: marijuana Lives alone, occasionally with significant other  Pertinent Family History: Mother - HTN, MI, heart disease  Remainder reviewed in history tab.   Important Outpatient Medications: Lipitor 80mg  every day Ivabradine 7.5mg  BID - however not reordering this as pt has prolonged Qtc and is on diuretic therapy Spironolactone 25mg  every day Torsemide 80mg  BID - holding as we are diuresing here  Remainder reviewed in medication history.   Objective: BP 135/78 (BP Location: Left Arm)   Pulse 83   Temp (!) 97.4 F (36.3 C) (Axillary)   Resp (!) 22   Ht 4\' 11"  (1.499 m)   Wt 43.5 kg   SpO2 100%   BMI 19.37 kg/m  Exam: General: alert, on BIPAP Eyes: EOMI ENTM: MMM Neck: no JVD Cardiovascular: RRR, no murmurs noted, though exam limited in this patient on BiPAP Respiratory: coarse breath sounds throughout with scattered wheezing, currently on BIPAP, no accessory muscle use Gastrointestinal: soft, non-tender MSK: no BLE swelling Neuro: A&O x3  Labs:  CBC BMET  Recent Labs  Lab 08/03/23 1733 08/03/23 1738  WBC 9.6  --   HGB 14.2 15.6*  HCT 44.4 46.0  PLT 207  --    Recent Labs  Lab 08/03/23 1733 08/03/23 1738  NA 138 141  K 3.6 3.6  CL 101 105  CO2 26  --   BUN 35* 36*  CREATININE 1.21* 1.20*  GLUCOSE 130* 127*  CALCIUM 9.6  --     Pertinent additional labs BNP 2209, initial troponin 41 EKG: Lots of artifact in most leads. Appears sinus rhythm. Will  repeat once off BiPAP   Imaging Studies Performed:  CXR IMPRESSION: 1. Cardiomegaly with pulmonary edema. 2. Blunting of the costophrenic angles may be due to hyperinflation or pleural effusions.  I reviewed and agree with radiologist interpretation.    Para March, DO 08/03/2023, 10:48 PM PGY-1, [facility]  FPTS Intern pager: [phone], text pages welcome Secure chat group [facility] Teaching Service    I have evaluated this patient along with Dr. Rexene Alberts and reviewed the above note, making necessary revisions.  Dorothyann Gibbs, MD 08/03/2023, 10:58 PM PGY-3, [facility]

## 2023-08-03 NOTE — ED Notes (Signed)
Pt to go up at 2300 per unit charge on 3E.

## 2023-08-03 NOTE — Progress Notes (Signed)
Pt placed on BIPAP 18/6 on 40% per MD order. RT will monitor.

## 2023-08-03 NOTE — ED Notes (Signed)
Pt states she is unable to get up and down to bedside commode. Purewick in place.

## 2023-08-03 NOTE — ED Notes (Signed)
Pt struggling to catch her breath. Dr Adela Lank to bedside. RT contacted to put the pt on BiPAP.

## 2023-08-03 NOTE — Assessment & Plan Note (Deleted)
Shortness of breath x2-3 days requiring BiPAP in the ED.  Chest x-ray with cardiomegaly and pulmonary edema.  BNP 2209.  Symptoms consistent with previous CHF exacerbations. -Progressive, inpatient status -S/p Lasix 80 mg x 2 -Cardiac telemetry while diuresing -Holding home torsemide - Holding home ivabradine for now while she receives high dose loop diuretics given her prolonged QTc. "Loop diuretics increase the arrhythmogenic properties of Ivabradine by preventing its metabolism and causing electrolyte imbalances." Brittany [name] et al. Tillman Sers 2019) -AM BMP, mag, TSH -Strict I's and O's, daily weights -Continue spironolactone 25 mg at bedtime

## 2023-08-03 NOTE — ED Triage Notes (Signed)
Pt BIB [facility] from home with c/o worsening SHOB since yesterday. Home nebulizer treatments did not help, continued to get worse. Duoneb  PTA. Pt has packemaker and stimulator. Hx CHF and COPD, swellings in feet noted.   114/58 HR 78 100% 2 L CBG 217

## 2023-08-03 NOTE — ED Notes (Signed)
Pt ambulated to the bathroom with portable O2 tank. Pt started breathing very hard and was saying she couldn't breathe. Pt was put on non-rebreather and O2 increased to 10L. Dr Adela Lank made aware.

## 2023-08-03 NOTE — ED Notes (Signed)
ED TO INPATIENT HANDOFF REPORT  ED Nurse Name and Phone #: Delfin Edis [phone]  S Name/Age/Gender Brittany [name] 62 y.o. female Room/Bed: 028C/028C  Code Status   Code Status: DNR  Home/SNF/Other Home Patient oriented to: self, place, time, and situation Is this baseline? Yes   Triage Complete: Triage complete  Chief Complaint Acute exacerbation of CHF (congestive heart failure) (HCC) [I50.9]  Triage Note Pt BIB [facility] from home with c/o worsening SHOB since yesterday. Home nebulizer treatments did not help, continued to get worse. Duoneb  PTA. Pt has packemaker and stimulator. Hx CHF and COPD, swellings in feet noted.   114/58 HR 78 100% 2 L CBG 217   Allergies Allergies  Allergen Reactions   Lactose Intolerance (Gi)     Per patient   Codeine Nausea And Vomiting    Level of Care/Admitting Diagnosis ED Disposition     ED Disposition  Admit   Condition  --   Comment  Hospital Area: [facility] [[id number]]  Level of Care: Progressive [102]  Admit to Progressive based on following criteria: CARDIOVASCULAR & THORACIC of moderate stability with acute coronary syndrome symptoms/low risk myocardial infarction/hypertensive urgency/arrhythmias/heart failure potentially compromising stability and stable post cardiovascular intervention patients.  Admit to Progressive based on following criteria: RESPIRATORY PROBLEMS hypoxemic/hypercapnic respiratory failure that is responsive to NIPPV (BiPAP) or High Flow Nasal Cannula (6-80 lpm). Frequent assessment/intervention, no > Q2 hrs < Q4 hrs, to maintain oxygenation and pulmonary hygiene.  May admit patient to [facility] or [facility] if equivalent level of care is available:: No  Covid Evaluation: Confirmed COVID Negative  Diagnosis: Acute exacerbation of CHF (congestive heart failure) [facility]) [[id number]]  Admitting Physician: Para March [[id number]]  Attending Physician: Caro Laroche  [[id number]]  Certification:: I certify this patient will need inpatient services for at least 2 midnights  Expected Medical Readiness: 08/06/2023          B Medical/Surgery History Past Medical History:  Diagnosis Date   Acute respiratory failure (HCC) 05/05/2018   Acute systolic congestive heart failure (HCC) 02/03/2018   AICD (automatic cardioverter/defibrillator) present    Allergy    Anxiety    Asthma    DM2 (diabetes mellitus, type 2) (HCC) 10/20/2020   Hypertension    PAF (paroxysmal atrial fibrillation) (HCC)    Presence of permanent cardiac pacemaker    Prophylactic measure 08/03/14-08/19/14   Prophyl. 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   LIMA to LAD, SVG to D1, SVG to OM1, EVH via right thigh with implantation of Impella LD LVAD via direct aortic approach   Small cell lung cancer (HCC) 03/16/2014   Past Surgical History:  Procedure Laterality Date   BRONCHIAL BRUSHINGS  10/25/2020   Procedure: BRONCHIAL BRUSHINGS;  Surgeon: Leslye Peer, MD;  Location: [facility] ENDOSCOPY;  Service: Pulmonary;;   BRONCHIAL NEEDLE ASPIRATION BIOPSY  10/25/2020   Procedure: BRONCHIAL NEEDLE ASPIRATION BIOPSIES;  Surgeon: Leslye Peer, MD;  Location: [facility] ENDOSCOPY;  Service: Pulmonary;;   CARDIAC DEFIBRILLATOR PLACEMENT  08/15/2018   MDT Visia AF MRI VR ICD implanted by Dr Georgena Spurling for primary prevention of sudden   CESAREAN SECTION     CORONARY ARTERY BYPASS GRAFT N/A 02/03/2018   Procedure: CORONARY ARTERY BYPASS GRAFTING (CABG);  Surgeon: Purcell Nails, MD;  Location: [facility] OR;  Service: Open Heart Surgery;  Laterality: N/A;  Time 3 using left internal mammary artery and endoscopically harvested right saphenous vein   CORONARY BALLOON  ANGIOPLASTY N/A 02/03/2018   Procedure: CORONARY BALLOON ANGIOPLASTY;  Surgeon: Swaziland, Peter M, MD;  Location: [facility] INVASIVE CV LAB;  Service: Cardiovascular;  Laterality: N/A;   CORONARY STENT INTERVENTION N/A 02/12/2019   Procedure:  CORONARY STENT INTERVENTION;  Surgeon: Iran Ouch, MD;  Location: [facility] INVASIVE CV LAB;  Service: Cardiovascular;  Laterality: N/A;   CORONARY/GRAFT ACUTE MI REVASCULARIZATION N/A 02/03/2018   Procedure: Coronary/Graft Acute MI Revascularization;  Surgeon: Swaziland, Peter M, MD;  Location: [facility] INVASIVE CV LAB;  Service: Cardiovascular;  Laterality: N/A;   ENDOBRONCHIAL ULTRASOUND N/A 10/25/2020   Procedure: ENDOBRONCHIAL ULTRASOUND;  Surgeon: Leslye Peer, MD;  Location: [facility] ENDOSCOPY;  Service: Pulmonary;  Laterality: N/A;   FLEXIBLE BRONCHOSCOPY  10/25/2020   Procedure: FLEXIBLE BRONCHOSCOPY;  Surgeon: Leslye Peer, MD;  Location: [facility] ENDOSCOPY;  Service: Pulmonary;;   IABP INSERTION N/A 02/03/2018   Procedure: IABP Insertion;  Surgeon: Swaziland, Peter M, MD;  Location: [facility] INVASIVE CV LAB;  Service: Cardiovascular;  Laterality: N/A;   INTRAOPERATIVE TRANSESOPHAGEAL ECHOCARDIOGRAM N/A 02/03/2018   Procedure: INTRAOPERATIVE TRANSESOPHAGEAL ECHOCARDIOGRAM;  Surgeon: Purcell Nails, MD;  Location: [facility] OR;  Service: Open Heart Surgery;  Laterality: N/A;   LEFT HEART CATH AND CORONARY ANGIOGRAPHY N/A 02/03/2018   Procedure: LEFT HEART CATH AND CORONARY ANGIOGRAPHY;  Surgeon: Swaziland, Peter M, MD;  Location: [facility] INVASIVE CV LAB;  Service: Cardiovascular;  Laterality: N/A;   LEFT HEART CATH AND CORS/GRAFTS ANGIOGRAPHY N/A 02/12/2019   Procedure: LEFT HEART CATH AND CORS/GRAFTS ANGIOGRAPHY;  Surgeon: Iran Ouch, MD;  Location: [facility] INVASIVE CV LAB;  Service: Cardiovascular;  Laterality: N/A;   MEDIASTINOSCOPY N/A 03/11/2014   Procedure: MEDIASTINOSCOPY;  Surgeon: Loreli Slot, MD;  Location: [facility] OR;  Service: Thoracic;  Laterality: N/A;   PLACEMENT OF IMPELLA LEFT VENTRICULAR ASSIST DEVICE  02/03/2018   Procedure: PLACEMENT OF IMPELLA LEFT VENTRICULAR ASSIST DEVICE LD;  Surgeon: Purcell Nails, MD;  Location: [facility] OR;  Service: Open Heart Surgery;;   REMOVAL OF IMPELLA LEFT VENTRICULAR ASSIST  DEVICE N/A 02/08/2018   Procedure: REMOVAL OF IMPELLA LEFT VENTRICULAR ASSIST DEVICE;  Surgeon: Purcell Nails, MD;  Location: [facility] OR;  Service: Open Heart Surgery;  Laterality: N/A;   RIGHT HEART CATH N/A 02/03/2018   Procedure: RIGHT HEART CATH;  Surgeon: Swaziland, Peter M, MD;  Location[facility] INVASIVE CV LAB;  Service: Cardiovascular;  Laterality: N/A;   RIGHT HEART CATH N/A 05/09/2018   Procedure: RIGHT HEART CATH;  Surgeon: Dolores Patty, MD;  Location: [facility] INVASIVE CV LAB;  Service: Cardiovascular;  Laterality: N/A;   TEE WITHOUT CARDIOVERSION N/A 02/08/2018   Procedure: TRANSESOPHAGEAL ECHOCARDIOGRAM (TEE);  Surgeon: Purcell Nails, MD;  Location: [facility] OR;  Service: Open Heart Surgery;  Laterality: N/A;   TUBAL LIGATION     VIDEO BRONCHOSCOPY WITH ENDOBRONCHIAL ULTRASOUND N/A 03/11/2014   Procedure: VIDEO BRONCHOSCOPY WITH ENDOBRONCHIAL ULTRASOUND;  Surgeon: Loreli Slot, MD;  Location: [facility] OR;  Service: Thoracic;  Laterality: N/A;     A IV Location/Drains/Wounds Patient Lines/Drains/Airways Status     Active Line/Drains/Airways     Name Placement date Placement time Site Days   Peripheral IV 08/03/23 20 G Right Antecubital 08/03/23  1453  Antecubital  less than 1            Intake/Output Last 24 hours No intake or output data in the 24 hours ending 08/03/23 2135  Labs/Imaging Results for orders placed or performed during the hospital encounter of 08/03/23 (from the past  48 hour(s))  SARS Coronavirus 2 by RT PCR (hospital order, performed in [facility] hospital lab) *cepheid single result test* Anterior Nasal Swab     Status: None   Collection Time: 08/03/23  3:12 PM   Specimen: Anterior Nasal Swab  Result Value Ref Range   SARS Coronavirus 2 by RT PCR NEGATIVE NEGATIVE    Comment: Performed at [facility] Lab, [address], [place], [place] [address]  Basic metabolic panel     Status: Abnormal   Collection Time: 08/03/23  5:33 PM  Result Value Ref Range    Sodium 138 135 - 145 mmol/L   Potassium 3.6 3.5 - 5.1 mmol/L   Chloride 101 98 - 111 mmol/L   CO2 26 22 - 32 mmol/L   Glucose, Bld 130 (H) 70 - 99 mg/dL    Comment: Glucose reference range applies only to samples taken after fasting for at least 8 hours.   BUN 35 (H) 8 - 23 mg/dL   Creatinine, Ser 6.21 (H) 0.44 - 1.00 mg/dL   Calcium 9.6 8.9 - 30.8 mg/dL   GFR, Estimated 51 (L) >60 mL/min    Comment: (NOTE) Calculated using the CKD-EPI Creatinine Equation (2021)    Anion gap 11 5 - 15    Comment: Performed at [facility] Lab, [address], [place], [place] [address]  CBC with Differential     Status: Abnormal   Collection Time: 08/03/23  5:33 PM  Result Value Ref Range   WBC 9.6 4.0 - 10.5 K/uL   RBC 4.80 3.87 - 5.11 MIL/uL   Hemoglobin 14.2 12.0 - 15.0 g/dL   HCT 69.6 29.5 - 28.4 %   MCV 92.5 80.0 - 100.0 fL   MCH 29.6 26.0 - 34.0 pg   MCHC 32.0 30.0 - 36.0 g/dL   RDW 13.2 (H) 44.0 - 10.2 %   Platelets 207 150 - 400 K/uL   nRBC 0.0 0.0 - 0.2 %   Neutrophils Relative % 80 %   Neutro Abs 7.7 1.7 - 7.7 K/uL   Lymphocytes Relative 10 %   Lymphs Abs 0.9 0.7 - 4.0 K/uL   Monocytes Relative 8 %   Monocytes Absolute 0.8 0.1 - 1.0 K/uL   Eosinophils Relative 1 %   Eosinophils Absolute 0.1 0.0 - 0.5 K/uL   Basophils Relative 1 %   Basophils Absolute 0.1 0.0 - 0.1 K/uL   Immature Granulocytes 0 %   Abs Immature Granulocytes 0.02 0.00 - 0.07 K/uL    Comment: Performed at [facility] Lab, [address] [place], [place] [address]  Brain natriuretic peptide     Status: Abnormal   Collection Time: 08/03/23  5:33 PM  Result Value Ref Range   B Natriuretic Peptide 2,209.9 (H) 0.0 - 100.0 pg/mL    Comment: Performed at [facility], [address] [place], [place] [address]  I-stat chem 8, ED (not at [facility], [facility] or [facility])     Status: Abnormal   Collection Time: 08/03/23  5:38 PM  Result Value Ref Range   Sodium 141 135 - 145 mmol/L   Potassium 3.6 3.5 - 5.1 mmol/L    Chloride 105 98 - 111 mmol/L   BUN 36 (H) 8 - 23 mg/dL   Creatinine, Ser 4.74 (H) 0.44 - 1.00 mg/dL   Glucose, Bld 259 (H) 70 - 99 mg/dL    Comment: Glucose reference range applies only to samples taken after fasting for at least 8  hours.   Calcium, Ion 1.15 1.15 - 1.40 mmol/L   TCO2 24 22 - 32 mmol/L   Hemoglobin 15.6 (H) 12.0 - 15.0 g/dL   HCT 98.1 19.1 - 47.8 %  Digoxin level     Status: Abnormal   Collection Time: 08/03/23  6:18 PM  Result Value Ref Range   Digoxin Level 0.3 (L) 0.8 - 2.0 ng/mL    Comment: Performed at [facility] Lab, [address] [place], [place] [address]  Troponin I (High Sensitivity)     Status: Abnormal   Collection Time: 08/03/23  6:18 PM  Result Value Ref Range   Troponin I (High Sensitivity) 41 (H) <18 ng/L    Comment: (NOTE) Elevated high sensitivity troponin I (hsTnI) values and significant  changes across serial measurements may suggest ACS but many other  chronic and acute conditions are known to elevate hsTnI results.  Refer to the "Links" section for chest pain algorithms and additional  guidance. Performed at [facility], [address] [place], [place] [address]    DG Chest Port 1 View  Result Date: 08/03/2023 CLINICAL DATA:  Shortness of breath. EXAM: PORTABLE CHEST 1 VIEW COMPARISON:  Radiograph 04/18/2023, CT 05/02/2023 FINDINGS: Prior median sternotomy. Left-sided pacemaker in place. Right-sided battery pack with lead coursing into the neck. Cardiomegaly is again seen. Stable mediastinal contours with aortic atherosclerosis. The lungs are hyperinflated. Blunting of the costophrenic angles may be due to hyperinflation or pleural effusions. Septal thickening with Kerley B line suspicious for pulmonary edema. No pneumothorax. IMPRESSION: 1. Cardiomegaly with pulmonary edema. 2. Blunting of the costophrenic angles may be due to hyperinflation or pleural effusions. Electronically Signed   By: Narda Rutherford M.D.   On: 08/03/2023 15:52     Pending Labs Unresulted Labs (From admission, onward)     Start     Ordered   08/04/23 0500  Basic metabolic panel  Tomorrow morning,   R        08/03/23 2105   08/04/23 0500  Magnesium  Tomorrow morning,   R        08/03/23 2105   08/03/23 2105  Magnesium  Add-on,   AD        08/03/23 2105   08/03/23 2104  TSH  Once,   R        08/03/23 2105   08/03/23 2054  HIV Antibody (routine testing w rflx)  (HIV Antibody (Routine testing w reflex) panel)  Once,   R        08/03/23 2105            Vitals/Pain Today's Vitals   08/03/23 1845 08/03/23 1900 08/03/23 1934 08/03/23 1946  BP: (!) 105/92  135/78   Pulse: (!) 102  91   Resp: 20 (!) 22 (!) 22   Temp:    (!) 97.4 F (36.3 C)  TempSrc:    Axillary  SpO2: 100% 99% 100%   Weight:      Height:      PainSc:        Isolation Precautions No active isolations  Medications Medications  atorvastatin (LIPITOR) tablet 80 mg (has no administration in time range)  digoxin (LANOXIN) tablet 125 mcg (has no administration in time range)  spironolactone (ALDACTONE) tablet 25 mg (has no administration in time range)  hydrOXYzine (ATARAX) tablet 25 mg (has no administration in time range)  dapagliflozin propanediol (FARXIGA) tablet 10 mg (has no administration in time range)  apixaban (ELIQUIS) tablet  2.5 mg (has no administration in time range)  acetaminophen (TYLENOL) tablet 650 mg (has no administration in time range)    Or  acetaminophen (TYLENOL) suppository 650 mg (has no administration in time range)  fluticasone furoate-vilanterol (BREO ELLIPTA) 200-25 MCG/ACT 1 puff (has no administration in time range)    And  umeclidinium bromide (INCRUSE ELLIPTA) 62.5 MCG/ACT 1 puff (has no administration in time range)  naphazoline-pheniramine (NAPHCON-A) 0.025-0.3 % ophthalmic solution 1 drop (has no administration in time range)  furosemide (LASIX) injection 80 mg (80 mg Intravenous Given 08/03/23 1812)  ondansetron (ZOFRAN) 4 MG/2ML  injection (  Given 08/03/23 1854)  furosemide (LASIX) injection 80 mg (80 mg Intravenous Given 08/03/23 1934)    Mobility walks     Focused Assessments Pulmonary Assessment Handoff:  Lung sounds: L Breath Sounds: Expiratory wheezes, Inspiratory wheezes R Breath Sounds: Expiratory wheezes, Inspiratory wheezes O2 Device: Bi-PAP O2 Flow Rate (L/min): 2 L/min    R Recommendations: See Admitting Provider Note  Report given to:   Additional Notes: Pt remains in bibab at this time. Given Lasix and has pure wick in place.

## 2023-08-03 NOTE — ED Notes (Signed)
Pt states she was sure that the EMS crew brought her laptop and purse. Looked around the room and even in the cupboards but it is not there. Aundra Millet, ED charge RN calling EMS to see if they can find it. The pt is calling her sister to go to the hotel and see if the items are in her hotel room.

## 2023-08-03 NOTE — Assessment & Plan Note (Signed)
Not currently in A-fib -Continue Eliquis 2.5 mg twice daily -Continue digoxin -On cardiac telemetry

## 2023-08-03 NOTE — ED Provider Notes (Signed)
[place] EMERGENCY DEPARTMENT AT [facility] Provider Note   CSN: [id number] Arrival date & time: 08/03/23  1430     History  Chief Complaint  Patient presents with   Shortness of Breath    Brittany [name] is a 62 y.o. female.  62 yo F with a chief complaint of difficulty breathing.  She feels that this been going on for about 2 or 3 days now.  She feels similar to when she has too much fluid on her.  Denies any recent change to her diuretics.  States been taking them as prescribed.  She has been coughing a bit and having a headache as well.  No known sick contacts.  Vaccinated she missed her cardiologist appointment this week.  Had to be rescheduled.  Has a history of heart failure.  She is also been having pain to her left side.  She has had CT imaging that showed that she had a new renal infarct.   Shortness of Breath      Home Medications Prior to Admission medications   Medication Sig Start Date End Date Taking? Authorizing Provider  albuterol (VENTOLIN HFA) 108 (90 Base) MCG/ACT inhaler Inhale 2 puffs into the lungs every 6 (six) hours as needed for wheezing or shortness of breath. 06/20/22   Zannie Cove, MD  apixaban (ELIQUIS) 2.5 MG TABS tablet Take 1 tablet (2.5 mg total) by mouth 2 (two) times daily. 04/17/23   Clegg, Amy D, NP  atorvastatin (LIPITOR) 80 MG tablet Take 1 tablet (80 mg total) by mouth daily. 04/23/23 07/22/23  Darlin Drop, DO  blood glucose meter kit and supplies KIT Dispense based on patient and insurance preference. Use up to four times daily as directed. 04/11/21   Osvaldo Shipper, MD  dapagliflozin propanediol (FARXIGA) 10 MG TABS tablet Take 1 tablet by mouth daily. 09/04/22   Bensimhon, Bevelyn Buckles, MD  digoxin (LANOXIN) 0.125 MG tablet TAKE 1 TABLET BY MOUTH DAILY 04/23/23   Jacklynn Ganong, FNP  Fluticasone-Umeclidin-Vilant (TRELEGY ELLIPTA) 200-62.5-25 MCG/ACT AEPB Inhale 1 puff into the lungs daily. 04/11/23   Olalere, Onnie Boer A,  MD  hydrOXYzine (VISTARIL) 25 MG capsule TAKE 1 CAPSULE (25 MG TOTAL) BY MOUTH DAILY AS NEEDED. 07/30/23   Rema Fendt, NP  hydrOXYzine (VISTARIL) 25 MG capsule Take 1 capsule (25 mg total) by mouth daily as needed. 07/30/23   Rema Fendt, NP  ipratropium-albuterol (DUONEB) 0.5-2.5 (3) MG/3ML SOLN Take 3 mLs by nebulization every 4 (four) hours as needed (for shortness of breath). 07/12/22   Laurey Morale, MD  ivabradine (CORLANOR) 7.5 MG TABS tablet Take 1 tablet (7.5 mg total) by mouth 2 (two) times daily with a meal. TAKE 1 TABLET (7.5 MG TOTAL) BY MOUTH 2 (TWO) TIMES DAILY WITH A MEAL. 10/10/22   Bensimhon, Bevelyn Buckles, MD  metolazone (ZAROXOLYN) 2.5 MG tablet Take 1 tablet (2.5 mg total) by mouth once a week. Take every Wednesday with extra 40 mEq of Potassium 06/27/23 09/25/23  [name], Anderson Malta, FNP  naphazoline-pheniramine (VISINE) 0.025-0.3 % ophthalmic solution Place 1 drop into both eyes 4 (four) times daily as needed for eye irritation.    [provider]  nitroGLYCERIN (NITROSTAT) 0.4 MG SL tablet Place 1 tablet (0.4 mg total) under the tongue every 5 (five) minutes as needed for chest pain. 02/14/19   Graciella Freer, PA-C  Nutritional Supplements (ENSURE ORIGINAL) LIQD Take 2 Bottles by mouth daily.    [provider]  potassium  chloride (KLOR-CON) 10 MEQ tablet Take 4 tablets (40 mEq total) by mouth 2 (two) times daily. Take extra 4 tablets (40 mEq total) with weekly Metolazone dose 06/27/23 09/25/23  [name], Anderson Malta, FNP  spironolactone (ALDACTONE) 25 MG tablet Take 1 tablet (25 mg total) by mouth at bedtime. 05/28/23 08/26/23  Jacklynn Ganong, FNP  torsemide (DEMADEX) 20 MG tablet Take 4 tablets (80 mg total) by mouth 2 (two) times daily. 06/27/23 09/25/23  Jacklynn Ganong, FNP      Allergies    Lactose intolerance (gi) and Codeine    Review of Systems   Review of Systems  Respiratory:  Positive for shortness of breath.     Physical  Exam Updated Vital Signs BP (!) 105/92   Pulse (!) 102   Temp 98 F (36.7 C) (Oral)   Resp (!) 22   Ht 4\' 11"  (1.499 m)   Wt 43.5 kg   SpO2 99%   BMI 19.37 kg/m  Physical Exam Vitals and nursing note reviewed.  Constitutional:      General: She is not in acute distress.    Appearance: She is well-developed. She is not diaphoretic.  HENT:     Head: Normocephalic and atraumatic.  Eyes:     Pupils: Pupils are equal, round, and reactive to light.  Cardiovascular:     Rate and Rhythm: Normal rate and regular rhythm.     Heart sounds: No murmur heard.    No friction rub. No gallop.  Pulmonary:     Effort: Pulmonary effort is normal. Tachypnea present.     Breath sounds: Examination of the right-lower field reveals rales. Examination of the left-lower field reveals rales. Rales present. No wheezing.  Abdominal:     General: There is no distension.     Palpations: Abdomen is soft.     Tenderness: There is no abdominal tenderness.  Musculoskeletal:        General: No tenderness.     Cervical back: Normal range of motion and neck supple.  Skin:    General: Skin is warm and dry.  Neurological:     Mental Status: She is alert and oriented to person, place, and time.  Psychiatric:        Behavior: Behavior normal.     ED Results / Procedures / Treatments   Labs (all labs ordered are listed, but only abnormal results are displayed) Labs Reviewed  BASIC METABOLIC PANEL - Abnormal; Notable for the following components:      Result Value   Glucose, Bld 130 (*)    BUN 35 (*)    Creatinine, Ser 1.21 (*)    GFR, Estimated 51 (*)    All other components within normal limits  CBC WITH DIFFERENTIAL/PLATELET - Abnormal; Notable for the following components:   RDW 17.9 (*)    All other components within normal limits  BRAIN NATRIURETIC PEPTIDE - Abnormal; Notable for the following components:   B Natriuretic Peptide 2,209.9 (*)    All other components within normal limits  DIGOXIN  LEVEL - Abnormal; Notable for the following components:   Digoxin Level 0.3 (*)    All other components within normal limits  I-STAT CHEM 8, ED - Abnormal; Notable for the following components:   BUN 36 (*)    Creatinine, Ser 1.20 (*)    Glucose, Bld 127 (*)    Hemoglobin 15.6 (*)    All other components within normal limits  TROPONIN I (HIGH SENSITIVITY) - Abnormal; Notable  for the following components:   Troponin I (High Sensitivity) 41 (*)    All other components within normal limits  SARS CORONAVIRUS 2 BY RT PCR    EKG EKG Interpretation Date/Time:  Friday August 03 2023 15:05:34 EDT Ventricular Rate:  81 PR Interval:  139 QRS Duration:  162 QT Interval:  454 QTC Calculation: 521 R Axis:   -74  Text Interpretation: Poor quality data, interpretation may be affected Sinus rhythm LAE, consider biatrial enlargement Left bundle branch block Artifact in lead(s) I II III aVR aVL aVF V1 V2 V3 V4 V5 V6 No significant change since last tracing Confirmed by Melene Plan [phone]) on 08/03/2023 3:13:27 PM  Radiology DG Chest Port 1 View  Result Date: 08/03/2023 CLINICAL DATA:  Shortness of breath. EXAM: PORTABLE CHEST 1 VIEW COMPARISON:  Radiograph 04/18/2023, CT 05/02/2023 FINDINGS: Prior median sternotomy. Left-sided pacemaker in place. Right-sided battery pack with lead coursing into the neck. Cardiomegaly is again seen. Stable mediastinal contours with aortic atherosclerosis. The lungs are hyperinflated. Blunting of the costophrenic angles may be due to hyperinflation or pleural effusions. Septal thickening with Kerley B line suspicious for pulmonary edema. No pneumothorax. IMPRESSION: 1. Cardiomegaly with pulmonary edema. 2. Blunting of the costophrenic angles may be due to hyperinflation or pleural effusions. Electronically Signed   By: Narda Rutherford M.D.   On: 08/03/2023 15:52    Procedures .Critical Care  Performed by: Melene Plan, DO Authorized by: Melene Plan, DO   Critical care  provider statement:    Critical care time (minutes):  35   Critical care time was exclusive of:  Separately billable procedures and treating other patients   Critical care was time spent personally by me on the following activities:  Development of treatment plan with patient or surrogate, discussions with consultants, evaluation of patient's response to treatment, examination of patient, ordering and review of laboratory studies, ordering and review of radiographic studies, ordering and performing treatments and interventions, pulse oximetry, re-evaluation of patient's condition and review of old charts   Care discussed with: admitting provider       Medications Ordered in ED Medications  furosemide (LASIX) injection 80 mg (has no administration in time range)  furosemide (LASIX) injection 80 mg (80 mg Intravenous Given 08/03/23 1812)  ondansetron (ZOFRAN) 4 MG/2ML injection (  Given 08/03/23 1854)    ED Course/ Medical Decision Making/ A&P                                 Medical Decision Making Amount and/or Complexity of Data Reviewed Labs: ordered.  Risk Prescription drug management. Decision regarding hospitalization.   62 yo F with a significant past medical history of heart failure last EF was 20 to 25% has a implanted stimulator device sees advanced heart failure clinic.  Chronically on diuretics.  Developed worsening difficulty breathing over the past couple days.  She has some symptoms concerning for possible infectious etiology though feels to her like prior heart failure exacerbation.  Will obtain a chest x-ray blood work.  Patient is on oxygen, has oxygen at home as needed but does not typically use it.  Likely admission.  Patient has slowly worsened while in the ED.  Now hearing Rales up to the clavicles.  Increased tachypnea.  No increased oxygen requirement.  Placed on BiPAP with improvement.  BNP elevated.  Dig level little bit low.  Given 80 of IV Lasix initially and  then repeated as she has not had much urine output as of yet.  Discussed with medicine for admission.  The patients results and plan were reviewed and discussed.   Any x-rays performed were independently reviewed by myself.   Differential diagnosis were considered with the presenting HPI.  Medications  furosemide (LASIX) injection 80 mg (has no administration in time range)  furosemide (LASIX) injection 80 mg (80 mg Intravenous Given 08/03/23 1812)  ondansetron (ZOFRAN) 4 MG/2ML injection (  Given 08/03/23 1854)    Vitals:   08/03/23 1815 08/03/23 1830 08/03/23 1845 08/03/23 1900  BP: (!) 144/72 107/70 (!) 105/92   Pulse: 75 94 (!) 102   Resp: (!) 26 (!) 25 20 (!) 22  Temp:      TempSrc:      SpO2: 100% 100% 100% 99%  Weight:      Height:        Final diagnoses:  Acute on chronic systolic congestive heart failure (HCC)    Admission/ observation were discussed with the admitting physician, patient and/or family and they are comfortable with the plan.          Final Clinical Impression(s) / ED Diagnoses Final diagnoses:  Acute on chronic systolic congestive heart failure [facility])    Rx / DC Orders ED Discharge Orders     None         Melene Plan, DO 08/03/23 1925

## 2023-08-04 ENCOUNTER — Other Ambulatory Visit: Payer: Self-pay

## 2023-08-04 DIAGNOSIS — N189 Chronic kidney disease, unspecified: Secondary | ICD-10-CM

## 2023-08-04 DIAGNOSIS — I5023 Acute on chronic systolic (congestive) heart failure: Secondary | ICD-10-CM | POA: Diagnosis not present

## 2023-08-04 DIAGNOSIS — I5043 Acute on chronic combined systolic (congestive) and diastolic (congestive) heart failure: Secondary | ICD-10-CM | POA: Diagnosis not present

## 2023-08-04 DIAGNOSIS — N179 Acute kidney failure, unspecified: Secondary | ICD-10-CM | POA: Diagnosis not present

## 2023-08-04 DIAGNOSIS — I480 Paroxysmal atrial fibrillation: Secondary | ICD-10-CM | POA: Diagnosis not present

## 2023-08-04 LAB — BASIC METABOLIC PANEL
Anion gap: 11 (ref 5–15)
Anion gap: 10 (ref 5–15)
BUN: 30 mg/dL — ABNORMAL HIGH (ref 8–23)
BUN: 40 mg/dL — ABNORMAL HIGH (ref 8–23)
CO2: 26 mmol/L (ref 22–32)
CO2: 28 mmol/L (ref 22–32)
Calcium: 9.2 mg/dL (ref 8.9–10.3)
Calcium: 9.6 mg/dL (ref 8.9–10.3)
Chloride: 99 mmol/L (ref 98–111)
Chloride: 100 mmol/L (ref 98–111)
Creatinine, Ser: 1.46 mg/dL — ABNORMAL HIGH (ref 0.44–1.00)
Creatinine, Ser: 1.45 mg/dL — ABNORMAL HIGH (ref 0.44–1.00)
GFR, Estimated: 40 mL/min — ABNORMAL LOW (ref >60)
GFR, Estimated: 41 mL/min — ABNORMAL LOW (ref >60)
Glucose, Bld: 120 mg/dL — ABNORMAL HIGH (ref 70–99)
Glucose, Bld: 133 mg/dL — ABNORMAL HIGH (ref 70–99)
Potassium: 4 mmol/L (ref 3.5–5.1)
Potassium: 4.4 mmol/L (ref 3.5–5.1)
Sodium: 136 mmol/L (ref 135–145)
Sodium: 138 mmol/L (ref 135–145)

## 2023-08-04 LAB — GLUCOSE, CAPILLARY
Glucose-Capillary: 120 mg/dL — ABNORMAL HIGH (ref 70–99)
Glucose-Capillary: 106 mg/dL — ABNORMAL HIGH (ref 70–99)
Glucose-Capillary: 184 mg/dL — ABNORMAL HIGH (ref 70–99)
Glucose-Capillary: 195 mg/dL — ABNORMAL HIGH (ref 70–99)

## 2023-08-04 LAB — COOXEMETRY PANEL
Carboxyhemoglobin: 1.9 % — ABNORMAL HIGH (ref 0.5–1.5)
Methemoglobin: <0.7 % (ref 0.0–1.5)
O2 Saturation: 67.8 %
Total hemoglobin: 13.4 g/dL (ref 12.0–16.0)

## 2023-08-04 LAB — LACTIC ACID, PLASMA: Lactic Acid, Venous: 1.3 mmol/L (ref 0.5–1.9)

## 2023-08-04 LAB — HIV ANTIBODY (ROUTINE TESTING W REFLEX): HIV Screen 4th Generation wRfx: NONREACTIVE

## 2023-08-04 LAB — MAGNESIUM
Magnesium: 2 mg/dL (ref 1.7–2.4)
Magnesium: 2.3 mg/dL (ref 1.7–2.4)

## 2023-08-04 MED ORDER — METOLAZONE 2.5 MG PO TABS
2.5000 mg | ORAL_TABLET | Freq: Once | ORAL | Status: AC
  Administered 2023-08-04: 2.5 mg via ORAL
  Filled 2023-08-04: qty 1

## 2023-08-04 MED ORDER — PREDNISONE 20 MG PO TABS: 40.0000 mg | ORAL_TABLET | Freq: Every day | ORAL | Status: DC

## 2023-08-04 MED ORDER — SODIUM CHLORIDE 0.9% FLUSH: 10.0000 mL | INTRAVENOUS | Status: DC | PRN

## 2023-08-04 MED ORDER — ACETAMINOPHEN 500 MG PO TABS
1000.0000 mg | ORAL_TABLET | Freq: Four times a day (QID) | ORAL | Status: DC
  Filled 2023-08-04: qty 2

## 2023-08-04 MED ORDER — IPRATROPIUM-ALBUTEROL 0.5-2.5 (3) MG/3ML IN SOLN
3.0000 mL | Freq: Four times a day (QID) | RESPIRATORY_TRACT | Status: DC
  Administered 2023-08-04 – 2023-08-06 (×9): 3 mL via RESPIRATORY_TRACT
  Filled 2023-08-04 (×9): qty 3

## 2023-08-04 MED ORDER — ENSURE ENLIVE PO LIQD
237.0000 mL | Freq: Two times a day (BID) | ORAL | Status: DC
  Administered 2023-08-04 – 2023-08-06 (×4): 237 mL via ORAL

## 2023-08-04 MED ORDER — FUROSEMIDE 10 MG/ML IJ SOLN
80.0000 mg | Freq: Two times a day (BID) | INTRAMUSCULAR | Status: DC
  Administered 2023-08-04: 80 mg via INTRAVENOUS
  Filled 2023-08-04: qty 8

## 2023-08-04 MED ORDER — PREDNISONE 20 MG PO TABS
40.0000 mg | ORAL_TABLET | Freq: Every day | ORAL | Status: DC
  Administered 2023-08-04 – 2023-08-06 (×3): 40 mg via ORAL
  Filled 2023-08-04 (×3): qty 2

## 2023-08-04 MED ORDER — FUROSEMIDE 10 MG/ML IJ SOLN: 80.0000 mg | Freq: Once | INTRAMUSCULAR | Status: DC

## 2023-08-04 MED ORDER — METHOCARBAMOL 500 MG PO TABS
500.0000 mg | ORAL_TABLET | Freq: Three times a day (TID) | ORAL | Status: DC | PRN
  Administered 2023-08-04 – 2023-08-06 (×5): 500 mg via ORAL
  Filled 2023-08-04 (×5): qty 1

## 2023-08-04 MED ORDER — CHLORHEXIDINE GLUCONATE CLOTH 2 % EX PADS
6.0000 | MEDICATED_PAD | Freq: Every day | CUTANEOUS | Status: DC
  Administered 2023-08-04 – 2023-08-06 (×3): 6 via TOPICAL

## 2023-08-04 MED ORDER — IVABRADINE HCL 5 MG PO TABS
7.5000 mg | ORAL_TABLET | Freq: Two times a day (BID) | ORAL | Status: DC
  Administered 2023-08-04 – 2023-08-06 (×5): 7.5 mg via ORAL
  Filled 2023-08-04 (×6): qty 1

## 2023-08-04 MED ORDER — ACETAMINOPHEN 650 MG RE SUPP: 650.0000 mg | Freq: Four times a day (QID) | RECTAL | Status: DC

## 2023-08-04 MED ORDER — SODIUM CHLORIDE 0.9% FLUSH
10.0000 mL | Freq: Two times a day (BID) | INTRAVENOUS | Status: DC
  Administered 2023-08-04 – 2023-08-06 (×4): 10 mL

## 2023-08-04 NOTE — Progress Notes (Signed)
   08/04/23 2347  BiPAP/CPAP/SIPAP  BiPAP/CPAP/SIPAP Pt Type Adult  BiPAP/CPAP/SIPAP Resmed  Mask Type Full face mask  Mask Size Small  Respiratory Rate 12 breaths/min  IPAP 18 cmH20  EPAP 6 cmH2O  FiO2 (%) 36 %  Flow Rate 4 lpm  Patient Home Equipment No  Auto Titrate No  BiPAP/CPAP /SiPAP Vitals  Pulse Rate 81  Resp 18  MEWS Score/Color  MEWS Score 0  MEWS Score Color Green

## 2023-08-04 NOTE — Progress Notes (Signed)
FMTS Brief Progress Note  S: Saw patient at bedside during night rounds.  Upon arrival patient was laying down in bed.  She says she is doing well and not in any acute distress.  However did express that she is still experiencing dyspnea although mildly improved from admission.  Patient stated she still have pressure feeling in her chest with is unchanged from admission this is reproducible with palpation.  Chest pressure has slightly improved.  Per patient she has only made slight improvement although her symptoms comes and goes.   O: BP 101/61 (BP Location: Left Arm)   Pulse 92   Temp 98.3 F (36.8 C) (Oral)   Resp (!) 24   Ht 4\' 11"  (1.499 m)   Wt 42.4 kg   SpO2 99%   BMI 18.88 kg/m   General: Pleasant , Non-toxic appearing, NAD CV: RRR, no murmurs, normal S1/S2 Pulm: CTAB, good WOB on 2L [place] Ext: No BLE edema   A/P: 62 year old female admitted for worsening dyspnea.  Mixed picture of possible CHF exacerbation vs COPD exacerbation.  Today received her first dose of prednisone and currently on bronchodilators.  Holding off on azithromycin due to prolonged QTc.  Has had decent urine output in the last 24 hours. -Cardiology following, appreciate recs -Continue close monitoring -Continue plans per day team - Orders reviewed. Labs for AM ordered, which was adjusted as needed.   Jerre Simon, MD 08/04/2023, 9:08 PM PGY-3, [facility] Night Resident  Please page [phone] with questions.

## 2023-08-04 NOTE — Consult Note (Addendum)
Cardiology Consultation   Patient ID: Brittany [name] MRN: [id number]; DOB: 1961-09-28  Admit date: 08/03/2023 Date of Consult: 08/04/2023  PCP:  Brittany Skeans, MD   [facility] Providers Cardiologist:  None  Electrophysiologist:  Hillis Range, MD (Inactive)  Advanced Heart Failure:  Arvilla Meres, MD       Patient Profile:   Brittany [name] is a 62 y.o. female with a hx of PAF,  previous smoker quit 2015, hypertension, previous small cell lung cancer treated with chemo, chest XRT and prophylactic brain radiation in 2015, CAD s/p CABG and chronic systolic HF EF ~25%   Admitted 02/03/2018 with NSTEMI and shock. Underwent emergent cath 02/03/18 showed LAD 100% stenosed, LCx 95% stenosed. Taken for emergent CABG 02/03/18. Required impella post op. Hospital course complicated by cardiogenic shock, HCAP, A fib, respiratory failure, and swallowing issues. She was discharged to SNF. Discharge weight 103 pounds.    In 2019 had multiple hospitalizations for HF and pleural effusion. Underwent pleurodesis at [facility].   Admitted 3/20 with NSTEMI and HF. Received DES to ostial ramus into distal left main based on cath below and underwent diuresis. Meds adjusted as tolerated. Echo with EF 25-30%.    Admitted 11/21 with COPD flare with mild HF and hemoptysis. CTA showed stable scarring in the right hilum consistent with emphysema.. Underwent bronchoscopy on 11/15, no obvious source of bleeding found on examination.    Echo 11/21 EF 20-25%. RV mildly HK.    Admitted 3/22 Echo with EF < 20%. Treated for AECOPD exacerbation.    Admitted 12/22 with R renal infarct felt to be cardio-embolic (no AF found). Started on Eliquis   Admitted 8/23 w/ a/c respiratory failure and CHF. Concern for low output vs PNA vs COPD exacerbation. PICC placed and milrinone started. Diuresed w/ IV Lasix. Given prednisone taper and doxy for COPD exacerbation. Echo showed EF 20-25%, global hypokinesis,  GIIDD, RV systolic function, mildly reduced. RV size normal. Mild MR, trivial TR, aortic valve w/ mild-mod regurg.   Barostim placed.    Admitted 12/23 with COPDE, a/c CHF and influenza +. Placed on BiPap, treated with tamiflu and diuresed with IV lasix. Empirically started milrinone over concern for low output   She was seen 3/24 in the HF clinic. Volume overloaded. Instructed to take torsemide 80 mg daily and take metolazone 3/27 and 03/09/23.   Follow up 4/24, NYHA III-IIIb, volume OK, ReDs 35%. Referred to paramedicine and Pulmonology.   Admitted 5/24 with N/V. CT ok, wet prep + trich, treated with IV Flagyl.  HsTroponin elevated. Cards consulted felt elevated troponin 2/2 demand ischemia. Echo showed EF 20-25%, LV with global HK, RV mildly reduced, moderate AI. [name] and KCL stopped with AKI and she was discharged home, weight 101 lbs.  Seen in the office 05/28/2023, [name] increased to 25 mg daily, torsemide increased to 60 mg a.m. and 40 mg p.m., KCl increased to 40 mEq a.m. and 20 mg p.m.  She occasionally takes as needed metolazone and was requested to take an extra 40 mEq of potassium when she does that.  She is not on aspirin or Xarelto due to bleeding issues, tolerating Eliquis 2.5 mg twice daily.  She is being seen 08/04/2023 for the evaluation of CHF exacerbation at the request of Dr. Linwood Dibbles.  History of Present Illness:   [name] says that her breathing started getting worse several days ago.  She developed orthopnea and PND.  She is compliant with her medications.  She has also  had some cramping today.  She coughs occasionally, and she can feel the congestion, but cannot bring anything up.  She is very concerned because they have stopped the Lasix and she says if she does not get more Lasix soon, she will be back on BiPAP.  She feels they stopped the Lasix because her blood pressure was low, but says it runs about 110 systolic at home.  She has chest wall  tenderness, and abdominal tenderness as well.   Past Medical History:  Diagnosis Date   Acute respiratory failure (HCC) 05/05/2018   Acute systolic congestive heart failure (HCC) 02/03/2018   AICD (automatic cardioverter/defibrillator) present    Allergy    Anxiety    Asthma    DM2 (diabetes mellitus, type 2) (HCC) 10/20/2020   Hypertension    PAF (paroxysmal atrial fibrillation) (HCC)    Presence of permanent cardiac pacemaker    Prophylactic measure 08/03/14-08/19/14   Prophyl. 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   LIMA to LAD, SVG to D1, SVG to OM1, EVH via right thigh with implantation of Impella LD LVAD via direct aortic approach   Small cell lung cancer (HCC) 03/16/2014    Past Surgical History:  Procedure Laterality Date   BRONCHIAL BRUSHINGS  10/25/2020   Procedure: BRONCHIAL BRUSHINGS;  Surgeon: Leslye Peer, MD;  Location: [facility] ENDOSCOPY;  Service: Pulmonary;;   BRONCHIAL NEEDLE ASPIRATION BIOPSY  10/25/2020   Procedure: BRONCHIAL NEEDLE ASPIRATION BIOPSIES;  Surgeon: Leslye Peer, MD;  Location: [facility] ENDOSCOPY;  Service: Pulmonary;;   CARDIAC DEFIBRILLATOR PLACEMENT  08/15/2018   MDT Visia AF MRI VR ICD implanted by Dr Georgena Spurling for primary prevention of sudden   CESAREAN SECTION     CORONARY ARTERY BYPASS GRAFT N/A 02/03/2018   Procedure: CORONARY ARTERY BYPASS GRAFTING (CABG);  Surgeon: Purcell Nails, MD;  Location: [facility] OR;  Service: Open Heart Surgery;  Laterality: N/A;  Time 3 using left internal mammary artery and endoscopically harvested right saphenous vein   CORONARY BALLOON ANGIOPLASTY N/A 02/03/2018   Procedure: CORONARY BALLOON ANGIOPLASTY;  Surgeon: Swaziland, Peter M, MD;  Location: [facility] INVASIVE CV LAB;  Service: Cardiovascular;  Laterality: N/A;   CORONARY STENT INTERVENTION N/A 02/12/2019   Procedure: CORONARY STENT INTERVENTION;  Surgeon: Iran Ouch, MD;  Location: [facility] INVASIVE CV LAB;  Service: Cardiovascular;  Laterality: N/A;    CORONARY/GRAFT ACUTE MI REVASCULARIZATION N/A 02/03/2018   Procedure: Coronary/Graft Acute MI Revascularization;  Surgeon: Swaziland, Peter M, MD;  Location: [facility] INVASIVE CV LAB;  Service: Cardiovascular;  Laterality: N/A;   ENDOBRONCHIAL ULTRASOUND N/A 10/25/2020   Procedure: ENDOBRONCHIAL ULTRASOUND;  Surgeon: Leslye Peer, MD;  Location: [facility] ENDOSCOPY;  Service: Pulmonary;  Laterality: N/A;   FLEXIBLE BRONCHOSCOPY  10/25/2020   Procedure: FLEXIBLE BRONCHOSCOPY;  Surgeon: Leslye Peer, MD;  Location: [facility] ENDOSCOPY;  Service: Pulmonary;;   IABP INSERTION N/A 02/03/2018   Procedure: IABP Insertion;  Surgeon: Swaziland, Peter M, MD;  Location: [facility] INVASIVE CV LAB;  Service: Cardiovascular;  Laterality: N/A;   INTRAOPERATIVE TRANSESOPHAGEAL ECHOCARDIOGRAM N/A 02/03/2018   Procedure: INTRAOPERATIVE TRANSESOPHAGEAL ECHOCARDIOGRAM;  Surgeon: Purcell Nails, MD;  Location[facility] OR;  Service: Open Heart Surgery;  Laterality: N/A;   LEFT HEART CATH AND CORONARY ANGIOGRAPHY N/A 02/03/2018   Procedure: LEFT HEART CATH AND CORONARY ANGIOGRAPHY;  Surgeon: Swaziland, Peter M, MD;  Location: [facility] INVASIVE CV LAB;  Service: Cardiovascular;  Laterality: N/A;   LEFT HEART CATH AND CORS/GRAFTS ANGIOGRAPHY N/A 02/12/2019  Procedure: LEFT HEART CATH AND CORS/GRAFTS ANGIOGRAPHY;  Surgeon: Iran Ouch, MD;  Location: [facility] INVASIVE CV LAB;  Service: Cardiovascular;  Laterality: N/A;   MEDIASTINOSCOPY N/A 03/11/2014   Procedure: MEDIASTINOSCOPY;  Surgeon: Loreli Slot, MD;  Location: [facility] OR;  Service: Thoracic;  Laterality: N/A;   PLACEMENT OF IMPELLA LEFT VENTRICULAR ASSIST DEVICE  02/03/2018   Procedure: PLACEMENT OF IMPELLA LEFT VENTRICULAR ASSIST DEVICE LD;  Surgeon: Purcell Nails, MD;  Location: [facility] OR;  Service: Open Heart Surgery;;   REMOVAL OF IMPELLA LEFT VENTRICULAR ASSIST DEVICE N/A 02/08/2018   Procedure: REMOVAL OF IMPELLA LEFT VENTRICULAR ASSIST DEVICE;  Surgeon: Purcell Nails, MD;  Location: [facility] OR;  Service:  Open Heart Surgery;  Laterality: N/A;   RIGHT HEART CATH N/A 02/03/2018   Procedure: RIGHT HEART CATH;  Surgeon: Swaziland, Peter M, MD;  Location: [facility] INVASIVE CV LAB;  Service: Cardiovascular;  Laterality: N/A;   RIGHT HEART CATH N/A 05/09/2018   Procedure: RIGHT HEART CATH;  Surgeon: Dolores Patty, MD;  Location: [facility] INVASIVE CV LAB;  Service: Cardiovascular;  Laterality: N/A;   TEE WITHOUT CARDIOVERSION N/A 02/08/2018   Procedure: TRANSESOPHAGEAL ECHOCARDIOGRAM (TEE);  Surgeon: Purcell Nails, MD;  Location: [facility] OR;  Service: Open Heart Surgery;  Laterality: N/A;   TUBAL LIGATION     VIDEO BRONCHOSCOPY WITH ENDOBRONCHIAL ULTRASOUND N/A 03/11/2014   Procedure: VIDEO BRONCHOSCOPY WITH ENDOBRONCHIAL ULTRASOUND;  Surgeon: Loreli Slot, MD;  Location: [facility] OR;  Service: Thoracic;  Laterality: N/A;     Home Medications:  Prior to Admission medications   Medication Sig Start Date End Date Taking? Authorizing Provider  albuterol (VENTOLIN HFA) 108 (90 Base) MCG/ACT inhaler Inhale 2 puffs into the lungs every 6 (six) hours as needed for wheezing or shortness of breath. 06/20/22  Yes Zannie Cove, MD  apixaban (ELIQUIS) 2.5 MG TABS tablet Take 1 tablet (2.5 mg total) by mouth 2 (two) times daily. 04/17/23  Yes Clegg, Amy D, NP  atorvastatin (LIPITOR) 80 MG tablet Take 1 tablet (80 mg total) by mouth daily. 04/23/23 08/03/23 Yes Darlin Drop, DO  blood glucose meter kit and supplies KIT Dispense based on patient and insurance preference. Use up to four times daily as directed. Patient taking differently: Inject 1 each into the skin See admin instructions. Dispense based on patient and insurance preference. Use up to four times daily as directed. 04/11/21  Yes Osvaldo Shipper, MD  dapagliflozin propanediol (FARXIGA) 10 MG TABS tablet Take 1 tablet by mouth daily. Patient taking differently: Take 10 mg by mouth daily. 09/04/22  Yes Bensimhon, Bevelyn Buckles, MD  digoxin (LANOXIN) 0.125 MG tablet TAKE 1 TABLET  BY MOUTH DAILY 04/23/23  Yes [name], Hope, FNP  Fluticasone-Umeclidin-Vilant (TRELEGY ELLIPTA) 200-62.5-25 MCG/ACT AEPB Inhale 1 puff into the lungs daily. 04/11/23  Yes Olalere, Adewale A, MD  hydrOXYzine (VISTARIL) 25 MG capsule TAKE 1 CAPSULE (25 MG TOTAL) BY MOUTH DAILY AS NEEDED. Patient taking differently: Take 25 mg by mouth daily as needed for anxiety. 07/30/23  Yes Zonia Kief, Amy J, NP  ipratropium-albuterol (DUONEB) 0.5-2.5 (3) MG/3ML SOLN Take 3 mLs by nebulization every 4 (four) hours as needed (for shortness of breath). 07/12/22  Yes Laurey Morale, MD  ivabradine (CORLANOR) 7.5 MG TABS tablet Take 1 tablet (7.5 mg total) by mouth 2 (two) times daily with a meal. TAKE 1 TABLET (7.5 MG TOTAL) BY MOUTH 2 (TWO) TIMES DAILY WITH A MEAL. 10/10/22  Yes Bensimhon, Bevelyn Buckles, MD  metolazone (ZAROXOLYN) 2.5 MG tablet Take 1 tablet (2.5 mg total) by mouth once a week. Take every Wednesday with extra 40 mEq of Potassium 06/27/23 09/25/23 Yes [name], [place], FNP  naphazoline-pheniramine (VISINE) 0.025-0.3 % ophthalmic solution Place 1 drop into both eyes 4 (four) times daily as needed for eye irritation.   Yes [provider]  nitroGLYCERIN (NITROSTAT) 0.4 MG SL tablet Place 1 tablet (0.4 mg total) under the tongue every 5 (five) minutes as needed for chest pain. 02/14/19  Yes Graciella Freer, PA-C  Nutritional Supplements (ENSURE ORIGINAL) LIQD Take 2 Bottles by mouth daily.   Yes [provider]  potassium chloride (KLOR-CON) 10 MEQ tablet Take 4 tablets (40 mEq total) by mouth 2 (two) times daily. Take extra 4 tablets (40 mEq total) with weekly Metolazone dose 06/27/23 09/25/23 Yes [name], Anderson Malta, FNP  spironolactone (ALDACTONE) 25 MG tablet Take 1 tablet (25 mg total) by mouth at bedtime. 05/28/23 08/26/23 Yes [name], Anderson Malta, FNP  torsemide (DEMADEX) 20 MG tablet Take 4 tablets (80 mg total) by mouth 2 (two) times daily. 06/27/23 09/25/23 Yes [name], Anderson Malta, FNP   hydrOXYzine (VISTARIL) 25 MG capsule Take 1 capsule (25 mg total) by mouth daily as needed. Patient not taking: Reported on 08/03/2023 07/30/23   Rema Fendt, NP    Inpatient Medications: Scheduled Meds:  apixaban  2.5 mg Oral BID   atorvastatin  80 mg Oral Daily   dapagliflozin propanediol  10 mg Oral Daily   digoxin  125 mcg Oral Daily   feeding supplement  237 mL Oral BID BM   fluticasone furoate-vilanterol  1 puff Inhalation Daily   And   umeclidinium bromide  1 puff Inhalation Daily   spironolactone  25 mg Oral QHS   Continuous Infusions:  PRN Meds: acetaminophen **OR** acetaminophen, hydrOXYzine, naphazoline-pheniramine  Allergies:    Allergies  Allergen Reactions   Lactose Intolerance (Gi)     Per patient   Codeine Nausea And Vomiting    Social History:   Social History   Socioeconomic History   Marital status: Married    Spouse name: Tristan Schroeder    Number of children: 1   Years of education: Not on file   Highest education level: Not on file  Occupational History   Occupation: disabled  Tobacco Use   Smoking status: Former    Current packs/day: 0.00    Average packs/day: 1 pack/day for 20.0 years (20.0 ttl pk-yrs)    Types: Cigarettes    Start date: 03/13/1994    Quit date: 03/13/2014    Years since quitting: 9.4   Smokeless tobacco: Never  Vaping Use   Vaping status: Never Used  Substance and Sexual Activity   Alcohol use: Yes    Alcohol/week: 5.0 standard drinks of alcohol    Types: 5 Glasses of wine per week    Comment: a few glasses of wine weekly   Drug use: Yes    Types: Marijuana    Comment: "medicinal", not daily   Sexual activity: Never  Other Topics Concern   Not on file  Social History Narrative   Not on file   Social Determinants of Health   Financial Resource Strain: Medium Risk (02/11/2021)   Overall Financial Resource Strain (CARDIA)    Difficulty of Paying Living Expenses: Somewhat hard  Food Insecurity: No Food  Insecurity (08/03/2023)   Hunger Vital Sign    Worried About Running Out of Food in the Last Year: Never true  Ran Out of Food in the Last Year: Never true  Transportation Needs: No Transportation Needs (08/03/2023)   PRAPARE - Administrator, Civil Service (Medical): No    Lack of Transportation (Non-Medical): No  Physical Activity: Insufficiently Active (02/11/2021)   Exercise Vital Sign    Days of Exercise per Week: 2 days    Minutes of Exercise per Session: 10 min  Stress: Stress Concern Present (02/11/2021)   [employer] of Occupational Health - Occupational Stress Questionnaire    Feeling of Stress : To some extent  Social Connections: Unknown (02/11/2021)   Social Connection and Isolation Panel [NHANES]    Frequency of Communication with Friends and Family: More than three times a week    Frequency of Social Gatherings with Friends and Family: Twice a week    Attends Religious Services: Not on Marketing executive or Organizations: Not on file    Attends Banker Meetings: Never    Marital Status: Married  Catering manager Violence: Not At Risk (08/03/2023)   Humiliation, Afraid, Rape, and Kick questionnaire    Fear of Current or Ex-Partner: No    Emotionally Abused: No    Physically Abused: No    Sexually Abused: No    Family History:   Family History  Problem Relation Age of Onset   Heart disease Mother    Hypertension Mother    Heart attack Mother    Hypertension Maternal Grandmother    Cancer Maternal Grandmother    Diabetes Paternal Grandmother    Stroke Neg Hx      ROS:  Please see the history of present illness.  All other ROS reviewed and negative.     Physical Exam/Data:   Vitals:   08/04/23 0135 08/04/23 0440 08/04/23 0610 08/04/23 0800  BP:  (!) 89/45 (!) 91/50 118/71  Pulse:   78 92  Resp:   19 19  Temp:  98.6 F (37 C) 98.6 F (37 C) 98.6 F (37 C)  TempSrc:  Oral  Oral  SpO2: 98%  91% 98%  Weight:  42.4 kg     Height:        Intake/Output Summary (Last 24 hours) at 08/04/2023 1032 Last data filed at 08/04/2023 0856 Gross per 24 hour  Intake 240 ml  Output 500 ml  Net -260 ml      08/04/2023    4:40 AM 08/03/2023   11:53 PM 08/03/2023    3:54 PM  Last 3 Weights  Weight (lbs) 93 lb 7.6 oz 93 lb 7.6 oz 95 lb 14.4 oz  Weight (kg) 42.4 kg 42.4 kg 43.5 kg     Body mass index is 18.88 kg/m.  General: Frail female, in acute respiratory distress HEENT: normal Neck:  JVD to jaw Vascular: No carotid bruits; Distal pulses 2+ bilaterally Cardiac:  normal S1, S2; RRR; no murmur, ++ rub Lungs: Rales and some rhonchi bilaterally, no wheezing Abd: soft, nontender, no hepatomegaly  Ext: no edema Musculoskeletal:  No deformities, BUE and BLE strength weak but equal Skin: warm and dry  Neuro:  CNs 2-12 intact, no focal abnormalities noted Psych:  Normal affect   EKG:  The EKG was personally reviewed and demonstrates:  HR 81, electrical artifact noted Telemetry:  Telemetry was personally reviewed and demonstrates:  SR  Relevant CV Studies:  - Echo (5/24): EF 20-25%, LV with global HK, RV mildly reduced, moderate AI.   - Echo (7/23): EF 20-25%, global  hypokinesis, GIIDD, RV systolic function, mildly reduced. RV size normal. Mild MR, trivial TR, aortic valve w/ mild-mod regurg   - Echo 1/23: EF 20-25%, severely reduced LV with global HK, grade II DD, normal RV   - Echo 02/10/21: EF <20%, G2DD   - Echo 2022: EF < 20% RV normal    - Echo 2021: EF 20-25% RV mildly reduced.    - Echo 02/12/19: EF 25-30%, RV mildly reduced   - 05/18/2021 PFT's: moderately severe airway obstruction and a diffusion defect suggesting emphysema.   - LHC 02/12/19: Ost LAD to Prox LAD lesion is 100% stenosed. Ost Cx to Prox Cx lesion is 100% stenosed. Ost Ramus lesion is 99% stenosed. Post intervention, there is a 0% residual stenosis. A drug-eluting stent was successfully placed using a STENT RESOLUTE ONYX 2.25X15. SVG  and is normal in caliber. The graft exhibits no disease. Ost 1st Diag lesion is 70% stenosed. SVG and is normal in caliber. Prox Graft lesion is 100% stenosed. 1.  Significant underlying two-vessel coronary artery disease with patent SVG to OM and SVG to diagonal which supplies the LAD territory.  Atretic LIMA to LAD given competitive flow from SVG to diagonal.  Severe ostial ramus artery stenosis not supplied by bypass with his area suggestive of plaque rupture. 2.  Severely elevated left ventricular end-diastolic pressure 34 mmHg.  Left ventricular angiography was not performed. 3.  Successful angioplasty and drug-eluting stent placement to the ostial ramus extending into the distal left main.   - RHC 2019  RA = 8 RV = 49/9 PA = 47/20 (32) PCW = 19 Fick cardiac output/index = 4.3/3.1 PVR = 3.0 wu AO sat = 93% PA sat = 62%, 64%    1. Minimally elevated left heart pressures 2. Mild PAH 3. Normal cardiac output  Laboratory Data:  High Sensitivity Troponin:   Recent Labs  Lab 08/03/23 1818  TROPONINIHS 41*     Chemistry Recent Labs  Lab 08/03/23 1733 08/03/23 1738 08/03/23 2257 08/04/23 0333  NA 138 141  --  136  K 3.6 3.6  --  4.0  CL 101 105  --  99  CO2 26  --   --  26  GLUCOSE 130* 127*  --  120*  BUN 35* 36*  --  30*  CREATININE 1.21* 1.20*  --  1.46*  CALCIUM 9.6  --   --  9.2  MG  --   --  2.0 2.0  GFRNONAA 51*  --   --  40*  ANIONGAP 11  --   --  11    No results for input(s): "PROT", "ALBUMIN", "AST", "ALT", "ALKPHOS", "BILITOT" in the last 168 hours. Lipids No results for input(s): "CHOL", "TRIG", "HDL", "LABVLDL", "LDLCALC", "CHOLHDL" in the last 168 hours.  Hematology Recent Labs  Lab 08/03/23 1733 08/03/23 1738  WBC 9.6  --   RBC 4.80  --   HGB 14.2 15.6*  HCT 44.4 46.0  MCV 92.5  --   MCH 29.6  --   MCHC 32.0  --   RDW 17.9*  --   PLT 207  --    Thyroid  Recent Labs  Lab 08/03/23 2257  TSH 0.787    BNP  B Natriuretic Peptide  Date  Value Ref Range Status  08/03/2023 2,209.9 (H) 0.0 - 100.0 pg/mL Final    Comment:    Performed at [facility] Lab, [address] [place], [place] [address]  05/28/2023 2,065.6 (H) 0.0 -  100.0 pg/mL Final    Comment:    Performed at [facility], [address], [place], [place] [address]  05/02/2023 1,263.9 (H) 0.0 - 100.0 pg/mL Final    Comment:    Performed at [facility], [address], [place], [place] [address]  04/18/2023 1,203.6 (H) 0.0 - 100.0 pg/mL Final    Comment:    Performed at [facility], [address] [place], [place] [address]    DDimer No results for input(s): "DDIMER" in the last 168 hours.   Radiology/Studies:  [facility] Chest Port 1 View  Result Date: 08/03/2023 CLINICAL DATA:  Shortness of breath. EXAM: PORTABLE CHEST 1 VIEW COMPARISON:  Radiograph 04/18/2023, CT 05/02/2023 FINDINGS: Prior median sternotomy. Left-sided pacemaker in place. Right-sided battery pack with lead coursing into the neck. Cardiomegaly is again seen. Stable mediastinal contours with aortic atherosclerosis. The lungs are hyperinflated. Blunting of the costophrenic angles may be due to hyperinflation or pleural effusions. Septal thickening with Kerley B line suspicious for pulmonary edema. No pneumothorax. IMPRESSION: 1. Cardiomegaly with pulmonary edema. 2. Blunting of the costophrenic angles may be due to hyperinflation or pleural effusions. Electronically Signed   By: Narda Rutherford M.D.   On: 08/03/2023 15:52     Assessment and Plan:   Acute on chronic systolic CHF -She got 2 doses of IV Lasix 80 mg about an hour apart yesterday. -Only 500 cc urine output is recorded -Her creatinine increased from 1.20 >> 1.46 overnight, but that is similar to previous values -check lactic acid level -diurese with IV lasix 80 mg BID plus metolazone today -in past admissions has required milrinone, will place PICC line and check co-ox/CVP  2. Chest pain -Pain is reproducible by  palpation, and troponin this admission 41 - However, she has a rub on exam, discuss treatment options with MD.  Otherwise, per IM  Risk Assessment/Risk Scores:       New York Heart Association (NYHA) Functional Class NYHA Class IV  CHA2DS2-VASc Score = 5   This indicates a 7.2% annual risk of stroke. The patient's score is based upon: CHF History: 1 HTN History: 1 Diabetes History: 1 Stroke History: 0 Vascular Disease History: 1 Age Score: 0 Gender Score: 1    For questions or updates, please contact [facility] Please consult [email or web address] for contact info under    Signed, Theodore Demark, PA-C  08/04/2023 10:32 AM  Patient seen and examined.  Agree with above documentation.  [name] is a 62 year old female with a history of CAD status post CABG, chronic combined heart failure with EF approximately 25%, paroxysmal atrial fibrillation, hypertension, small cell lung cancer who we are consulted by Dr. Linwood Dibbles for evaluation of heart failure.  She was admitted in 01/2018 with NSTEMI and cardiogenic shock.  Cath showed LAD occlusion, 95% LCx stenosis, she was taken for emergent CABG, required Impella postoperatively.  She was admitted 02/2019 with NSTEMI, underwent DES to ostial ramus and distal left main.  Echo at that time was EF 25 to 30%.  During admission 07/2022 there was concern for low output heart failure and was started on milrinone.  Echo at that time showed EF 20 to 25%.  Also required milrinone during admission 11/2022.  Last seen in advanced heart failure clinic 05/28/2023.  Home regimen was torsemide 60/40, as needed metolazone, spironolactone 25 mg daily, digoxin 0.125 mg daily, Corlanor 7.5 mg twice daily, Farxiga 10 mg daily.  She previously failed Entresto due to  hyperkalemia and not on beta-blocker due to intolerance.  Has not been considered a candidate for advanced therapies due to her chronic hypoxic respiratory failure with COPD.  She presents  with worsening shortness of breath.  Vital signs notable for BP 112/61, pulse 82, SpO2 98% on 2 L.  Labs notable for creatinine 1.21 > 1.46, BNP 2209, hemoglobin 14.2.  Chest x-ray shows pulmonary edema.  EKG sinus rhythm, rate 82, LVH, artifact.  She was given IV Lasix 80 mg x 2 yesterday.  Only recorded as 500 cc UOP.   On exam, patient is alert and oriented, regular rate and rhythm, no murmurs, diffuse expiratory wheezing, no LE edema, +JVD.  For her acute on chronic combined heart failure, she appears volume overloaded on exam with elevated JVD.  Did not respond well to IV Lasix 80 mg yesterday.  Will add metolazone today.  In the past has required milrinone.  Will place PICC, monitor CVP/coox.  Check lactate. Suspect COPD exacerbation also contributing to her hypoxic respiratory failure, significant wheezing on exam; treatment per primary team.  Little Ishikawa, MD

## 2023-08-04 NOTE — Progress Notes (Signed)
   08/04/23 0135  BiPAP/CPAP/SIPAP  $ Non-Invasive Ventilator  Non-Invasive Vent Initial  $ Face Mask Medium Yes  BiPAP/CPAP/SIPAP Pt Type Adult  BiPAP/CPAP/SIPAP Resmed  Mask Type Full face mask  Mask Size Medium  IPAP 18 cmH20  EPAP 6 cmH2O  FiO2 (%) 36 %  Nasal massage performed Yes  CPAP/SIPAP surface wiped down Yes  BiPAP/CPAP /SiPAP Vitals  SpO2 98 %  Bilateral Breath Sounds Coarse crackles

## 2023-08-04 NOTE — Progress Notes (Signed)
Peripherally Inserted Central Catheter Placement  The IV Nurse has discussed with the patient and/or persons authorized to consent for the patient, the purpose of this procedure and the potential benefits and risks involved with this procedure.  The benefits include less needle sticks, lab draws from the catheter, and the patient may be discharged home with the catheter. Risks include, but not limited to, infection, bleeding, blood clot (thrombus formation), and puncture of an artery; nerve damage and irregular heartbeat and possibility to perform a PICC exchange if needed/ordered by physician.  Alternatives to this procedure were also discussed.  Bard Power PICC patient education guide, fact sheet on infection prevention and patient information card has been provided to patient /or left at bedside.    PICC Placement Documentation  PICC Double Lumen 08/04/23 Right Basilic 35 cm 0 cm (Active)  Indication for Insertion or Continuance of Line Chronic illness with exacerbations (CF, Sickle Cell, etc.) 08/04/23 1329  Exposed Catheter (cm) 0 cm 08/04/23 1329  Site Assessment Clean, Dry, Intact 08/04/23 1329  Lumen #1 Status Flushed;Saline locked;Blood return noted 08/04/23 1329  Lumen #2 Status Flushed;Saline locked;Blood return noted 08/04/23 1329  Dressing Type Transparent;Securing device 08/04/23 1329  Dressing Status Antimicrobial disc in place;Clean, Dry, Intact 08/04/23 1329  Line Care Connections checked and tightened 08/04/23 1329  Line Adjustment (NICU/IV Team Only) No 08/04/23 1329  Dressing Intervention New dressing 08/04/23 1329  Dressing Change Due 08/11/23 08/04/23 1329       Elliot Dally 08/04/2023, 1:30 PM

## 2023-08-04 NOTE — Assessment & Plan Note (Signed)
Qtc - caution with QT-prolonging medications

## 2023-08-04 NOTE — Plan of Care (Signed)
Brittany [name]

## 2023-08-04 NOTE — Assessment & Plan Note (Addendum)
Glucose 127 on admission, 120 this morning. Last A1C 3 months ago was 7.3 -Continue home Farxiga 10 mg -CBGs

## 2023-08-04 NOTE — Plan of Care (Signed)
  Problem: Education: Goal: Knowledge of General Education information will improve Description: Including pain rating scale, medication(s)/side effects and non-pharmacologic comfort measures Outcome: Progressing   Problem: Health Behavior/Discharge Planning: Goal: Ability to manage health-related needs will improve Outcome: Progressing   Problem: Clinical Measurements: Goal: Ability to maintain clinical measurements within normal limits will improve Outcome: Progressing Goal: Will remain free from infection Outcome: Progressing Goal: Respiratory complications will improve Outcome: Progressing   Problem: Activity: Goal: Risk for activity intolerance will decrease Outcome: Progressing   Problem: Nutrition: Goal: Adequate nutrition will be maintained Outcome: Progressing   Problem: Elimination: Goal: Will not experience complications related to bowel motility Outcome: Progressing Goal: Will not experience complications related to urinary retention Outcome: Progressing   Problem: Pain Managment: Goal: General experience of comfort will improve Outcome: Progressing   Problem: Safety: Goal: Ability to remain free from injury will improve Outcome: Progressing   Problem: Skin Integrity: Goal: Risk for impaired skin integrity will decrease Outcome: Progressing

## 2023-08-04 NOTE — Assessment & Plan Note (Signed)
Not currently in A-fib -Continue Eliquis 2.5 mg twice daily -Continue digoxin -On cardiac telemetry

## 2023-08-04 NOTE — Progress Notes (Signed)
Daily Progress Note Intern Pager: [phone]  Patient name: Brittany [name] Medical record number: [id number] Date of birth: December 01, 1961 Age: 62 y.o. Gender: female  Primary Care Provider: Georganna Skeans, MD Consultants: none Code Status: DNR  Pt Overview and Major Events to Date:  8/23 - admitted  Assessment and Plan:  Presented with SOB and increased WOB requiring BIPAP. Suspect this to be 2/2 acute exacerbation of CHF, pt does appear volume overloaded. Could have been caused by viral illness, COVID negative in ED. Plan for today includes continuing to diurese as patient still appears volume overloaded and has not had much output.  Pertinent PMH/PSH includes ICD placement 2019, CABG 2019, coronary stent 2020, HTN, afib, T2DM, asthma, SCLC (remission since 2015).  Assessment & Plan Acute exacerbation of CHF (congestive heart failure) (HCC) Shortness of breath x2-3 days requiring BiPAP in the ED.  Chest x-ray with cardiomegaly and pulmonary edema.  BNP 2209.  Symptoms consistent with previous CHF exacerbations. -Progressive, inpatient status -S/p Lasix 80 mg x 2 in the ED. Will order another Lasix 80mg  now.  -Consult cardiology, appreciate recs regarding diuresis -Cardiac telemetry while diuresing -K>4, Mg>2 -Holding home torsemide -Pt on home metolazone 2.5mg  once weekly - Holding home ivabradine for now while she receives high dose loop diuretics given her prolonged QTc. "Loop diuretics increase the arrhythmogenic properties of Ivabradine by preventing its metabolism and causing electrolyte imbalances." Laveda Norman et al. Tillman Sers 2019) -Continue spironolactone 25 mg at bedtime -AM BMP, mag, TSH -Strict I's and O's, daily weights Type 2 diabetes mellitus with stage 3a chronic kidney disease, without long-term current use of insulin (HCC) Glucose 127 on admission, 120 this morning. Last A1C 3 months ago was 7.3 -Continue home Farxiga 10 mg -CBGs AF (paroxysmal atrial  fibrillation) (HCC) Not currently in A-fib -Continue Eliquis 2.5 mg twice daily -Continue digoxin -On cardiac telemetry Prolonged QT interval Qtc - caution with QT-prolonging medications   FEN/GI: regular PPx: on home eliquis 2.5mg  BID Dispo:Home pending clinical improvement . Barriers include none currently.   Subjective:  Happy to no longer be on BiPAP.  States she feels like she is breathing a bit better.  She does note decreased urinary output since yesterday.  She is also very hungry  Objective: Temp:  [97.4 F (36.3 C)-98.6 F (37 C)] 98.6 F (37 C) (08/24 0610) Pulse Rate:  [75-102] 78 (08/24 0610) Resp:  [17-26] 19 (08/24 0610) BP: (89-144)/(45-108) 91/50 (08/24 0610) SpO2:  [91 %-100 %] 91 % (08/24 0610) FiO2 (%):  [36 %-40 %] 36 % (08/24 0135) Weight:  [42.4 kg-43.5 kg] 42.4 kg (08/24 0440) Physical Exam: General: Sleeping initially, no acute distress Cardiovascular: Regular rate and rhythm, no murmurs Respiratory: On 4 L nasal cannula.  Coarse breath sounds in all lung fields with some scattered wheezes.  Unchanged from last exam.  No increased work of breathing.  No accessory muscle use. Abdomen: Soft nontender Extremities: No bilateral extremity swelling or tenderness to palpation  Laboratory: Most recent CBC Lab Results  Component Value Date   WBC 9.6 08/03/2023   HGB 15.6 (H) 08/03/2023   HCT 46.0 08/03/2023   MCV 92.5 08/03/2023   PLT 207 08/03/2023   Most recent BMP    Latest Ref Rng & Units 08/04/2023    3:33 AM  BMP  Glucose 70 - 99 mg/dL 841   BUN 8 - 23 mg/dL 30   Creatinine 3.24 - 1.00 mg/dL 4.01   Sodium 027 - 253 mmol/L  136   Potassium 3.5 - 5.1 mmol/L 4.0   Chloride 98 - 111 mmol/L 99   CO2 22 - 32 mmol/L 26   Calcium 8.9 - 10.3 mg/dL 9.2     Other pertinent labs TSH WNL (0.787), Mag 2.0  Imaging/Diagnostic Tests: Initial CXR IMPRESSION: 1. Cardiomegaly with pulmonary edema. 2. Blunting of the costophrenic angles  may be due to hyperinflation or pleural effusions.  I independently reviewed and agree with radiologist's impression of imaging study.  No new imaging.   Oliviya Gilkison, DO 08/04/2023, 6:49 AM  PGY-1, [facility] Family Medicine FPTS Intern pager: [phone], text pages welcome Secure chat group [facility] Teaching Service

## 2023-08-05 DIAGNOSIS — I5023 Acute on chronic systolic (congestive) heart failure: Secondary | ICD-10-CM | POA: Diagnosis not present

## 2023-08-05 DIAGNOSIS — J441 Chronic obstructive pulmonary disease with (acute) exacerbation: Secondary | ICD-10-CM | POA: Diagnosis not present

## 2023-08-05 DIAGNOSIS — J9601 Acute respiratory failure with hypoxia: Secondary | ICD-10-CM

## 2023-08-05 DIAGNOSIS — I480 Paroxysmal atrial fibrillation: Secondary | ICD-10-CM | POA: Diagnosis not present

## 2023-08-05 DIAGNOSIS — N1832 Chronic kidney disease, stage 3b: Secondary | ICD-10-CM

## 2023-08-05 DIAGNOSIS — I5043 Acute on chronic combined systolic (congestive) and diastolic (congestive) heart failure: Secondary | ICD-10-CM | POA: Diagnosis not present

## 2023-08-05 LAB — HEMOGLOBIN A1C
Hgb A1c MFr Bld: 6.1 % — ABNORMAL HIGH (ref 4.8–5.6)
Mean Plasma Glucose: 128.37 mg/dL

## 2023-08-05 LAB — GLUCOSE, CAPILLARY
Glucose-Capillary: 285 mg/dL — ABNORMAL HIGH (ref 70–99)
Glucose-Capillary: 235 mg/dL — ABNORMAL HIGH (ref 70–99)
Glucose-Capillary: 285 mg/dL — ABNORMAL HIGH (ref 70–99)
Glucose-Capillary: 225 mg/dL — ABNORMAL HIGH (ref 70–99)

## 2023-08-05 LAB — MAGNESIUM
Magnesium: 2.4 mg/dL (ref 1.7–2.4)
Magnesium: 2.3 mg/dL (ref 1.7–2.4)

## 2023-08-05 LAB — BASIC METABOLIC PANEL
Anion gap: 8 (ref 5–15)
Anion gap: 13 (ref 5–15)
BUN: 42 mg/dL — ABNORMAL HIGH (ref 8–23)
BUN: 51 mg/dL — ABNORMAL HIGH (ref 8–23)
CO2: 27 mmol/L (ref 22–32)
CO2: 26 mmol/L (ref 22–32)
Calcium: 9.7 mg/dL (ref 8.9–10.3)
Calcium: 10.1 mg/dL (ref 8.9–10.3)
Chloride: 96 mmol/L — ABNORMAL LOW (ref 98–111)
Chloride: 94 mmol/L — ABNORMAL LOW (ref 98–111)
Creatinine, Ser: 1.52 mg/dL — ABNORMAL HIGH (ref 0.44–1.00)
Creatinine, Ser: 1.47 mg/dL — ABNORMAL HIGH (ref 0.44–1.00)
GFR, Estimated: 39 mL/min — ABNORMAL LOW (ref >60)
GFR, Estimated: 40 mL/min — ABNORMAL LOW (ref >60)
Glucose, Bld: 228 mg/dL — ABNORMAL HIGH (ref 70–99)
Glucose, Bld: 268 mg/dL — ABNORMAL HIGH (ref 70–99)
Potassium: 4.6 mmol/L (ref 3.5–5.1)
Potassium: 4.5 mmol/L (ref 3.5–5.1)
Sodium: 131 mmol/L — ABNORMAL LOW (ref 135–145)
Sodium: 133 mmol/L — ABNORMAL LOW (ref 135–145)

## 2023-08-05 MED ORDER — GUAIFENESIN-DM 100-10 MG/5ML PO SYRP
5.0000 mL | ORAL_SOLUTION | ORAL | Status: DC | PRN
  Administered 2023-08-05 – 2023-08-06 (×5): 5 mL via ORAL
  Filled 2023-08-05 (×5): qty 5

## 2023-08-05 MED ORDER — INSULIN ASPART 100 UNIT/ML IJ SOLN
0.0000 [IU] | Freq: Three times a day (TID) | INTRAMUSCULAR | Status: DC
  Administered 2023-08-05 – 2023-08-06 (×2): 5 [IU] via SUBCUTANEOUS
  Administered 2023-08-06 (×2): 3 [IU] via SUBCUTANEOUS

## 2023-08-05 MED ORDER — ACETAMINOPHEN 500 MG PO TABS: 1000.0000 mg | ORAL_TABLET | Freq: Four times a day (QID) | ORAL | Status: DC | PRN

## 2023-08-05 MED ORDER — ACETAMINOPHEN 650 MG RE SUPP: 650.0000 mg | Freq: Four times a day (QID) | RECTAL | Status: DC | PRN

## 2023-08-05 MED ORDER — TORSEMIDE 20 MG PO TABS
80.0000 mg | ORAL_TABLET | Freq: Two times a day (BID) | ORAL | Status: DC
  Administered 2023-08-05 (×2): 80 mg via ORAL
  Filled 2023-08-05 (×3): qty 4

## 2023-08-05 NOTE — Assessment & Plan Note (Signed)
Not currently in A-fib -Continue Eliquis 2.5 mg twice daily -Continue digoxin -On cardiac telemetry

## 2023-08-05 NOTE — Progress Notes (Addendum)
Patient declines BIPAP for tonight. Unit at bedside and told patient to call if she changed her mind.

## 2023-08-05 NOTE — Progress Notes (Addendum)
Rounding Note    Patient Name: Brittany [name] Date of Encounter: 08/05/2023  [facility] Cardiologist: None   Subjective   -2.7 L yesterday.  Creatinine 1.45 > 1.52.  Reports dyspnea improving.  BP 99/42 this morning.  Inpatient Medications    Scheduled Meds:  acetaminophen  1,000 mg Oral Q6H   Or   acetaminophen  650 mg Rectal Q6H   apixaban  2.5 mg Oral BID   atorvastatin  80 mg Oral Daily   Chlorhexidine Gluconate Cloth  6 each Topical Daily   dapagliflozin propanediol  10 mg Oral Daily   digoxin  125 mcg Oral Daily   feeding supplement  237 mL Oral BID BM   fluticasone furoate-vilanterol  1 puff Inhalation Daily   And   umeclidinium bromide  1 puff Inhalation Daily   ipratropium-albuterol  3 mL Nebulization Q6H   ivabradine  7.5 mg Oral BID WC   predniSONE  40 mg Oral Q breakfast   sodium chloride flush  10-40 mL Intracatheter Q12H   spironolactone  25 mg Oral QHS   Continuous Infusions:  PRN Meds: guaiFENesin-dextromethorphan, methocarbamol, naphazoline-pheniramine, sodium chloride flush   Vital Signs    Vitals:   08/05/23 0443 08/05/23 0750 08/05/23 0848 08/05/23 0849  BP:  (!) 99/42    Pulse:  93    Resp:  (!) 22    Temp:  98 F (36.7 C)    TempSrc:  Oral    SpO2:  100% 100% 100%  Weight: 44.1 kg     Height:        Intake/Output Summary (Last 24 hours) at 08/05/2023 0853 Last data filed at 08/05/2023 0559 Gross per 24 hour  Intake 240 ml  Output 2950 ml  Net -2710 ml      08/05/2023    4:43 AM 08/04/2023    4:40 AM 08/03/2023   11:53 PM  Last 3 Weights  Weight (lbs) 97 lb 3.6 oz 93 lb 7.6 oz 93 lb 7.6 oz  Weight (kg) 44.1 kg 42.4 kg 42.4 kg      Telemetry    Appears normal sinus rhythm, significant artifact due to barostim- Personally Reviewed  ECG    No new ECG - Personally Reviewed  Physical Exam   GEN: No acute distress.   Neck: No JVD Cardiac: RRR, no murmurs, rubs, or gallops.  Respiratory: No  wheezing GI: Soft, nontender MS: No edema Neuro:  Nonfocal  Psych: Normal affect   Labs    High Sensitivity Troponin:   Recent Labs  Lab 08/03/23 1818  TROPONINIHS 41*     Chemistry Recent Labs  Lab 08/04/23 0333 08/04/23 1723 08/05/23 0435  NA 136 138 131*  K 4.0 4.4 4.6  CL 99 100 96*  CO2 26 28 27   GLUCOSE 120* 133* 228*  BUN 30* 40* 42*  CREATININE 1.46* 1.45* 1.52*  CALCIUM 9.2 9.6 9.7  MG 2.0 2.3 2.4  GFRNONAA 40* 41* 39*  ANIONGAP 11 10 8     Lipids No results for input(s): "CHOL", "TRIG", "HDL", "LABVLDL", "LDLCALC", "CHOLHDL" in the last 168 hours.  Hematology Recent Labs  Lab 08/03/23 1733 08/03/23 1738  WBC 9.6  --   RBC 4.80  --   HGB 14.2 15.6*  HCT 44.4 46.0  MCV 92.5  --   MCH 29.6  --   MCHC 32.0  --   RDW 17.9*  --   PLT 207  --    Thyroid  Recent Labs  Lab 08/03/23 2257  TSH 0.787    BNP Recent Labs  Lab 08/03/23 1733  BNP 2,209.9*    DDimer No results for input(s): "DDIMER" in the last 168 hours.   Radiology    [place] EKG SITE RITE  Result Date: 08/04/2023 If [facility] image not attached, placement could not be confirmed due to current cardiac rhythm.  DG Chest Port 1 View  Result Date: 08/03/2023 CLINICAL DATA:  Shortness of breath. EXAM: PORTABLE CHEST 1 VIEW COMPARISON:  Radiograph 04/18/2023, CT 05/02/2023 FINDINGS: Prior median sternotomy. Left-sided pacemaker in place. Right-sided battery pack with lead coursing into the neck. Cardiomegaly is again seen. Stable mediastinal contours with aortic atherosclerosis. The lungs are hyperinflated. Blunting of the costophrenic angles may be due to hyperinflation or pleural effusions. Septal thickening with Kerley B line suspicious for pulmonary edema. No pneumothorax. IMPRESSION: 1. Cardiomegaly with pulmonary edema. 2. Blunting of the costophrenic angles may be due to hyperinflation or pleural effusions. Electronically Signed   By: Narda Rutherford M.D.   On: 08/03/2023 15:52     Cardiac Studies     Patient Profile     62 y.o. 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  Presented with worsening shortness of breath.  Chest x-ray with pulmonary edema.  BNP 2210 -PICC placed yesterday, Coox 68%.  Normal lactate.  No evidence of low output -Diuresed with IV Lasix and metolazone yesterday, net -2.7 L.  CVP 7 this morning.  Weight is 97 pounds this morning, about which she has been at recent clinic visits (99 pounds 05/2023). Does not appear volume overloaded on exam.  Restart home torsemide 80 mg twice daily -Continue home digoxin, ivabradine, spironolactone, Farxiga.  Previously failed Entresto due to hyperkalemia not on beta-blocker due to intolerance  Acute hypoxic respiratory failure: Suspect likely multifactorial with COPD exacerbation and decompensated heart failure as above.  Treatment of COPD per primary team. Significant wheezing on exam yesterday, improved today   CKD stage IIIb: Creatinine 1.52 today, which appears around baseline, will monitor  Paroxysmal atrial fibrillation: Currently in sinus rhythm.  Continue Eliquis, digoxin  Patient follows with Dr [name], will ask Advanced Heart Failure to see patient on Monday  For questions or updates, please contact [facility] Please consult [email or web address] for contact info under        Signed, Little Ishikawa, MD  08/05/2023, 8:53 AM

## 2023-08-05 NOTE — Assessment & Plan Note (Signed)
Qtc - caution with QT-prolonging medications

## 2023-08-05 NOTE — Assessment & Plan Note (Signed)
Glucose higher than normal due to prednisone  -Continue home Farxiga 10 mg - Start SSI sensitive  -CBGs

## 2023-08-05 NOTE — Assessment & Plan Note (Signed)
Co-ox unlikely solely CHF exacerbation. Now on Home O2.  - HF following, appreciate recommendations  - Transition from IV lasix to home torsemide Bid today  -Cardiac telemetry while diuresing -K>4, Mg>2 -Continue spironolactone 25 mg at bedtime -AM BMP, mag, TSH -Strict I's and O's, daily weights - Daily prednisone  - Continue Breztri (formulary alternative) and duonebs  - Incentive spirometer

## 2023-08-05 NOTE — Assessment & Plan Note (Signed)
Has CKD III at baseline. Cr 1.45 > 1.52.  - transition from IV lasix to torsemide, avoid other nephrotoxic agents.  - Monitor I&O.

## 2023-08-05 NOTE — Progress Notes (Signed)
Daily Progress Note Intern Pager: [phone]  Patient name: Brittany [name] Medical record number: [id number] Date of birth: 12-02-1961 Age: 62 y.o. Gender: female  Primary Care Provider: Georganna Skeans, MD Consultants: Heart Failure,  Code Status: DNR  Pt Overview and Major Events to Date:  8/23 - admitted   Assessment and Plan: Brittany [name] is a 62 year old female who presented with shortness of breath requiring BiPAP.  This was most likely secondary to decompensated heart failure in addition to COPD exacerbation.  Most likely patient started to gain fluid which triggered her COPD.  Unlikely bacterial component.  Much improved after diuresis yesterday and initiation of prednisone.  Pertinent past medical history and past surgical history includes ICD placement 2019, CABG 2019, coronary stent 2020, hypertension, A-fib, type 2 diabetes, asthma, SCLC, remission since 2015  Assessment & Plan Acute on chronic respiratory failure (HCC) Co-ox unlikely solely CHF exacerbation. Now on Home O2.  - HF following, appreciate recommendations  - Transition from IV lasix to home torsemide Bid today  -Cardiac telemetry while diuresing -K>4, Mg>2 -Continue spironolactone 25 mg at bedtime -AM BMP, mag, TSH -Strict I's and O's, daily weights - Daily prednisone  - Continue Breztri (formulary alternative) and duonebs  - Incentive spirometer  Type 2 diabetes mellitus with stage 3a chronic kidney disease, without long-term current use of insulin (HCC) Glucose higher than normal due to prednisone  -Continue home Farxiga 10 mg - Start SSI sensitive  -CBGs PAF (paroxysmal atrial fibrillation) (HCC) Not currently in A-fib -Continue Eliquis 2.5 mg twice daily -Continue digoxin -On cardiac telemetry Prolonged QT interval Qtc - caution with QT-prolonging medications AKI (acute kidney injury) (HCC) Has CKD III at baseline. Cr 1.45 > 1.52.  - transition from IV  lasix to torsemide, avoid other nephrotoxic agents.  - Monitor I&O.   FEN/GI: Renal  PPx: Home eliquis  Dispo:Home most likely tomorrow. May need PTAR for transportation    Subjective:  Patient says she feels much much better.  She says that she just received a DuoNeb breathing treatment.  Says that she believes after getting some fluid off that she is doing better and especially now with the prednisone.  Denies any increased work of breathing at this time.  Patient says that she would like to leave today but understands if we believe that she needs to wait a little longer to improve.  Says that her significant other can help her at home.  Says that he could pick her up today but that she would have some trouble with transportation tomorrow as everyone will be at work.  Says that she could get a PCP appointment next week.  Objective: Temp:  [97.8 F (36.6 C)-98.9 F (37.2 C)] 98.9 F (37.2 C) (08/25 1146) Pulse Rate:  [78-96] 82 (08/25 1146) Resp:  [12-24] 12 (08/25 1146) BP: (94-112)/(42-64) 112/52 (08/25 1146) SpO2:  [99 %-100 %] 99 % (08/25 1444) FiO2 (%):  [36 %] 36 % (08/24 2347) Weight:  [44.1 kg] 44.1 kg (08/25 0443) Physical Exam: General: Chronically ill, thin, in no distress  Cardiovascular: RRR, pacemaker in place, well perfused  Respiratory: No increased work of breathing, Clear to auscultation except for faint wheeze at the bases, prolonged expiratory phase  Abdomen: Soft, non tender, non distended Extremities: Non edematous   Laboratory: Most recent CBC Lab Results  Component Value Date   WBC 9.6 08/03/2023   HGB 15.6 (H) 08/03/2023   HCT 46.0 08/03/2023   MCV 92.5 08/03/2023  PLT 207 08/03/2023   Most recent BMP    Latest Ref Rng & Units 08/05/2023    4:35 AM  BMP  Glucose 70 - 99 mg/dL 621   BUN 8 - 23 mg/dL 42   Creatinine 3.08 - 1.00 mg/dL 6.57   Sodium 846 - 962 mmol/L 131   Potassium 3.5 - 5.1 mmol/L 4.6   Chloride 98 - 111 mmol/L 96   CO2 22 - 32  mmol/L 27   Calcium 8.9 - 10.3 mg/dL 9.7       Lockie Mola, MD 08/05/2023, 2:51 PM  PGY-2, [facility] FPTS Intern pager: [phone], text pages welcome Secure chat group [facility] Teaching Service

## 2023-08-06 ENCOUNTER — Other Ambulatory Visit (HOSPITAL_COMMUNITY): Payer: Self-pay

## 2023-08-06 DIAGNOSIS — J9620 Acute and chronic respiratory failure, unspecified whether with hypoxia or hypercapnia: Secondary | ICD-10-CM | POA: Diagnosis not present

## 2023-08-06 DIAGNOSIS — I5043 Acute on chronic combined systolic (congestive) and diastolic (congestive) heart failure: Secondary | ICD-10-CM | POA: Diagnosis not present

## 2023-08-06 LAB — BASIC METABOLIC PANEL
Anion gap: 14 (ref 5–15)
Anion gap: 15 (ref 5–15)
BUN: 64 mg/dL — ABNORMAL HIGH (ref 8–23)
BUN: 75 mg/dL — ABNORMAL HIGH (ref 8–23)
CO2: 31 mmol/L (ref 22–32)
CO2: 30 mmol/L (ref 22–32)
Calcium: 10.4 mg/dL — ABNORMAL HIGH (ref 8.9–10.3)
Calcium: 10.2 mg/dL (ref 8.9–10.3)
Chloride: 89 mmol/L — ABNORMAL LOW (ref 98–111)
Chloride: 86 mmol/L — ABNORMAL LOW (ref 98–111)
Creatinine, Ser: 1.76 mg/dL — ABNORMAL HIGH (ref 0.44–1.00)
Creatinine, Ser: 1.9 mg/dL — ABNORMAL HIGH (ref 0.44–1.00)
GFR, Estimated: 32 mL/min — ABNORMAL LOW (ref >60)
GFR, Estimated: 29 mL/min — ABNORMAL LOW (ref >60)
Glucose, Bld: 171 mg/dL — ABNORMAL HIGH (ref 70–99)
Glucose, Bld: 249 mg/dL — ABNORMAL HIGH (ref 70–99)
Potassium: 3.5 mmol/L (ref 3.5–5.1)
Potassium: 4 mmol/L (ref 3.5–5.1)
Sodium: 134 mmol/L — ABNORMAL LOW (ref 135–145)
Sodium: 131 mmol/L — ABNORMAL LOW (ref 135–145)

## 2023-08-06 LAB — COOXEMETRY PANEL
Carboxyhemoglobin: 2.2 % — ABNORMAL HIGH (ref 0.5–1.5)
Methemoglobin: 1.4 % (ref 0.0–1.5)
O2 Saturation: 70 %
Total hemoglobin: 15.5 g/dL (ref 12.0–16.0)

## 2023-08-06 LAB — GLUCOSE, CAPILLARY
Glucose-Capillary: 233 mg/dL — ABNORMAL HIGH (ref 70–99)
Glucose-Capillary: 268 mg/dL — ABNORMAL HIGH (ref 70–99)
Glucose-Capillary: 227 mg/dL — ABNORMAL HIGH (ref 70–99)

## 2023-08-06 LAB — MAGNESIUM: Magnesium: 2.3 mg/dL (ref 1.7–2.4)

## 2023-08-06 MED ORDER — ATORVASTATIN CALCIUM 80 MG PO TABS
80.0000 mg | ORAL_TABLET | Freq: Every day | ORAL | 0 refills | Status: DC
Start: 2023-08-06 — End: 2024-10-22
  Filled 2023-08-06: qty 30, 30d supply, fill #0

## 2023-08-06 MED ORDER — PREDNISONE 20 MG PO TABS
40.0000 mg | ORAL_TABLET | Freq: Every day | ORAL | 0 refills | Status: DC
  Filled 2023-08-06: qty 4, 2d supply, fill #0

## 2023-08-06 MED ORDER — METHOCARBAMOL 500 MG PO TABS
500.0000 mg | ORAL_TABLET | Freq: Two times a day (BID) | ORAL | 0 refills | Status: AC | PRN
  Filled 2023-08-06: qty 6, 3d supply, fill #0

## 2023-08-06 MED ORDER — IPRATROPIUM-ALBUTEROL 0.5-2.5 (3) MG/3ML IN SOLN: 3.0000 mL | RESPIRATORY_TRACT | Status: DC | PRN

## 2023-08-06 MED ORDER — GUAIFENESIN-DM 100-10 MG/5ML PO SYRP
5.0000 mL | ORAL_SOLUTION | ORAL | 0 refills | Status: DC | PRN
  Filled 2023-08-06: qty 118, 4d supply, fill #0

## 2023-08-06 NOTE — Progress Notes (Signed)
Heart Failure Navigator Progress Note  Assessed for Heart & Vascular TOC clinic readiness.  Patient does not meet criteria due to Advanced Heart team consult.   Navigator will sign off at this time.   Doris Gruhn,RN, BSN,MSN Heart Failure Nurse Navigator. Contact by secure chat only.

## 2023-08-06 NOTE — Progress Notes (Signed)
Mobility Specialist Progress Note:    08/06/23 1107  Mobility  Activity Ambulated with assistance in hallway  Level of Assistance Standby assist, set-up cues, supervision of patient - no hands on  Assistive Device None  Distance Ambulated (ft) 300 ft  Activity Response Tolerated well  Mobility Referral Yes  $Mobility charge 1 Mobility  Mobility Specialist Start Time (ACUTE ONLY) 1045  Mobility Specialist Stop Time (ACUTE ONLY) 1100  Mobility Specialist Time Calculation (min) (ACUTE ONLY) 15 min   Received pt in bed having no complaints, hesitant but agreeable to mobility. Pt was asymptomatic throughout ambulation and returned to room w/o fault. Left in bed w/ call bell in reach and all needs met.   Thompson Grayer Mobility Specialist  Please contact vis Secure Chat or  Rehab Office [phone]

## 2023-08-06 NOTE — Progress Notes (Incomplete)
Initial Nutrition Assessment  DOCUMENTATION CODES:      INTERVENTION:  ***  NUTRITION DIAGNOSIS:     related to   as evidenced by  .  GOAL:      MONITOR:      REASON FOR ASSESSMENT:   Malnutrition Screening Tool    ASSESSMENT:   Pt admitted with SOB and volume overload c/w CHF exacerbation. PMH significant for chronic combined CHF (LVEF 20-25%), pleurodesis (2019), CAD s/p emergent CABG (2019), HTN, COPD, h/o HAP, lung cancer, T2DM, CKD 3a.   Meal completions: 8/24: 0% breakfast, 50% dinner 8/25: 100% x3 documented meals  Wt Readings from Last 20 Encounters:  08/06/23 41.4 kg  05/30/23 43.5 kg  05/28/23 44.9 kg  05/28/23 44.9 kg  05/23/23 42.6 kg  05/23/23 42.6 kg  05/16/23 44 kg  05/02/23 44.8 kg  05/01/23 43.5 kg  04/22/23 46.1 kg  04/17/23 43.5 kg  04/11/23 43.7 kg  04/10/23 43.1 kg  03/27/23 44 kg  03/15/23 45.8 kg  03/07/23 46.4 kg  03/03/23 43.8 kg  12/12/22 46.7 kg  11/24/22 46.7 kg  11/08/22 49.5 kg   Medications: farxiga, SSI 0-9 units TID, prednisone   Labs: sodium 134, BUN 64, Cr 1.76, calcium 10.4, GFR 32, CBG's 225-285 x24 hours, HgbA1c 6.1%  NUTRITION - FOCUSED PHYSICAL EXAM:  {RD Focused Exam List:21252}  Diet Order:   Diet Order             Diet Heart Room service appropriate? Yes; Fluid consistency: Thin  Diet effective now                   EDUCATION NEEDS:      Skin:  Skin Assessment: Reviewed RN Assessment  Last BM:  8/25 (type 2 small)  Height:   Ht Readings from Last 1 Encounters:  08/03/23 4\' 11"  (1.499 m)    Weight:   Wt Readings from Last 1 Encounters:  08/06/23 41.4 kg   BMI:  Body mass index is 18.42 kg/m.  Estimated Nutritional Needs:   Kcal:     Protein:     Fluid:     Drusilla Kanner, RDN, LDN Clinical Nutrition

## 2023-08-06 NOTE — Discharge Instructions (Addendum)
Dear Hedda Slade,   Thank you so much for allowing [place] to be part of your care!  You were admitted to [facility] for breathing difficulty that was likely because of a mix of heart failure and COPD exacerbation.  You were treated with medicines to help get fluid out of your lungs as well as steroids to help bring down inflammation in your air passages.  The number for [employer] is [phone]. You already have a referral in place.   DISCHARGE MEDICATION CHANGES Continue taking prednisone (steroid) for 2 more days We are sending you home with Robaxin that you can take as needed for muscle spasms. You have been taking that while in the hospital already. We are sending you home with some cough medicine that you can take as needed.   POST-HOSPITAL & CARE INSTRUCTIONS  Please let PCP/Specialists know of any changes that were made.  Please see medications section of this packet for any medication changes.   DOCTOR'S APPOINTMENT & FOLLOW UP CARE INSTRUCTIONS  Future Appointments  Date Time Provider Department Center  08/14/2023  7:40 AM [facility] DEVICE REMOTES [facility]  09/19/2023 12:40 PM [facility] DEVICE REMOTES [facility]  10/10/2023  2:45 PM Lanier Prude, MD [facility]  12/19/2023 12:40 PM [facility] DEVICE REMOTES [facility]  03/19/2024 12:40 PM [facility] DEVICE REMOTES [facility]  06/18/2024 12:40 PM [facility] DEVICE REMOTES [facility]     RETURN PRECAUTIONS: If you have a 3 pound weight gain in 24 hours or 5 pound weight gain in 1 week Shortness of breath, with or without a dry hacking cough Swelling in her hands, feet, or stomach If you have to sleep on multiple pillows at night to breathe  Take care and be well!  Family Medicine Teaching Service Inpatient Team [facility]  [address] [place], [place] [address] [phone]

## 2023-08-06 NOTE — TOC Initial Note (Addendum)
Transition of Care [facility]) - Initial/Assessment Note    Patient Details  Name: Brittany [name] MRN: [id number] Date of Birth: 1961/01/01  Transition of Care [facility] CM/SW Contact:    Elliot Cousin, RN Phone Number: [phone] 08/06/2023, 3:09 PM  Clinical Narrative:  CM spoke to pt and states she will need ambulance transport to her room at [facility], [address], [place]. Pt states she live alone at the hotel. She used Medicaid transportation or her friend will take her appts. Pt declines HH and states she cancelled Outpatient HF paramedicine team for coming out. Will arrange PTAR at dc.              Sister will provide transportation home.       Expected Discharge Plan: Home/Self Care Barriers to Discharge: Continued Medical Work up   Patient Goals and CMS Choice Patient states their goals for this hospitalization and ongoing recovery are:: wants to remain independent CMS Medicare.gov Compare Post Acute Care list provided to:: Patient        Expected Discharge Plan and Services   Discharge Planning Services: CM Consult   Living arrangements for the past 2 months: Hotel/Motel                                      Prior Living Arrangements/Services Living arrangements for the past 2 months: Hotel/Motel Lives with:: Self Patient language and need for interpreter reviewed:: Yes Do you feel safe going back to the place where you live?: Yes      Need for Family Participation in Patient Care: No (Comment) Care giver support system in place?: No (comment) Current home services: DME (rollator, scale, oxygen, Bipap) Criminal Activity/Legal Involvement Pertinent to Current Situation/Hospitalization: No - Comment as needed  Activities of Daily Living Home Assistive Devices/Equipment: CBG Meter, Oxygen, Walker (specify type), BIPAP, Nebulizer (rollator) ADL Screening (condition at time of admission) Patient's cognitive ability adequate to  safely complete daily activities?: Yes Is the patient deaf or have difficulty hearing?: No Does the patient have difficulty seeing, even when wearing glasses/contacts?: No Does the patient have difficulty concentrating, remembering, or making decisions?: No Patient able to express need for assistance with ADLs?: Yes Does the patient have difficulty dressing or bathing?: No Independently performs ADLs?: Yes (appropriate for developmental age) Does the patient have difficulty walking or climbing stairs?: No Weakness of Legs: None Weakness of Arms/Hands: None  Permission Sought/Granted Permission sought to share information with : Case Manager, Family Supports, PCP Permission granted to share information with : Yes, Verbal Permission Granted  Share Information with NAME: Barbara Cower  Permission granted to share info w AGENCY: Home Health  Permission granted to share info w Relationship: SO  Permission granted to share info w Contact Information: [phone]  Emotional Assessment Appearance:: Appears stated age Attitude/Demeanor/Rapport: Engaged Affect (typically observed): Accepting Orientation: : Oriented to Self, Oriented to Place, Oriented to  Time, Oriented to Situation   Psych Involvement: No (comment)  Admission diagnosis:  Acute exacerbation of CHF (congestive heart failure) (HCC) [I50.9] Acute on chronic systolic congestive heart failure (HCC) [I50.23] Patient Active Problem List   Diagnosis Date Noted   AKI (acute kidney injury) (HCC) 08/04/2023   Acute on chronic respiratory failure (HCC) 08/03/2023   Trichomonas infection 04/19/2023   Presence of heart assist device (HCC) 04/11/2023   COPD with acute exacerbation (HCC) 03/01/2023  CAD (coronary artery disease) 03/01/2023   Acute respiratory failure with hypoxia (HCC) 11/14/2022   Sepsis (HCC) 11/14/2022   Influenza A with pneumonia 11/14/2022   Troponin level elevated 11/14/2022   History of lung cancer  11/14/2022   Acute combined systolic and diastolic CHF, NYHA class 4 (HCC) 07/09/2022   Malnutrition of moderate degree 06/20/2022   Lactic acidosis 04/12/2022   Prolonged QT interval 04/09/2022   Hypotension 04/09/2022   Chronic HFrEF (heart failure with reduced ejection fraction) (HCC) 12/11/2021   CHF exacerbation (HCC) 12/10/2021   Renal infarct (HCC) 12/10/2021   COPD exacerbation (HCC) 09/29/2021   Mixed diabetic hyperlipidemia associated with type 2 diabetes mellitus (HCC) 09/29/2021   CKD stage 3b, GFR 30-44 ml/min (HCC) 09/29/2021   Unstable angina (HCC) 02/09/2021   Hemoptysis 10/20/2020   Type 2 diabetes mellitus with stage 3a chronic kidney disease, without long-term current use of insulin (HCC) 10/20/2020   Acute on chronic combined systolic and diastolic CHF (congestive heart failure) (HCC) 05/21/2020   Ischemic cardiomyopathy 04/12/2020   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   NSTEMI (non-ST elevated myocardial infarction) (HCC) 02/12/2019   CHF (congestive heart failure) (HCC) 11/24/2018   Recurrent pleural effusion on right 05/05/2018   Acute on chronic respiratory failure with hypoxia (HCC) 05/05/2018   Chronic respiratory failure with hypoxia (HCC)    Asthma    Anxiety    Allergy    HCAP (healthcare-associated pneumonia) 03/15/2015   Chest pain 03/15/2015   Small cell lung cancer (HCC) 03/16/2014   Protein-calorie malnutrition, severe (HCC) 03/14/2014   PAF (paroxysmal atrial fibrillation) (HCC) 03/12/2014   Essential hypertension 05/07/2012   PCP:  Georganna Skeans, MD Pharmacy:   [id number] - [place], [place] - [address] [place] [address] Phone: [phone] Fax: [phone]     Social Determinants of Health (SDOH) Social History: SDOH Screenings   Food Insecurity: No Food Insecurity (08/03/2023)  Housing: Low Risk  (08/03/2023)  Transportation Needs: No Transportation Needs (08/03/2023)   Utilities: Not At Risk (08/03/2023)  Alcohol Screen: Low Risk  (02/11/2021)  Depression (PHQ2-9): Low Risk  (05/30/2023)  Financial Resource Strain: Medium Risk (02/11/2021)  Physical Activity: Insufficiently Active (02/11/2021)  Social Connections: Unknown (02/11/2021)  Stress: Stress Concern Present (02/11/2021)  Tobacco Use: Medium Risk (08/03/2023)   SDOH Interventions:     Readmission Risk Interventions    06/20/2022   10:00 AM 04/11/2021   10:16 AM  Readmission Risk Prevention Plan  Transportation Screening Complete Complete  PCP or Specialist Appt within 3-5 Days Complete Complete  HRI or Home Care Consult Complete Complete  Social Work Consult for Recovery Care Planning/Counseling Complete Complete  Palliative Care Screening Not Applicable Not Applicable  Medication Review Oceanographer) Complete Complete

## 2023-08-06 NOTE — Progress Notes (Signed)
 PICC line removed per order and with no complications.  Pressure held to achieve hemostasis.  Vaseline/gauze/tegaderm applied.  Aftercare instructions reviewed with patient who verbalized understanding.

## 2023-08-06 NOTE — Assessment & Plan Note (Deleted)
Not currently in A-fib -Continue Eliquis 2.5 mg twice daily -Continue digoxin -On cardiac telemetry

## 2023-08-06 NOTE — Assessment & Plan Note (Addendum)
Glucose higher than normal due to prednisone, CBG 233 this morning -Continue home Farxiga 10 mg -SSI sensitive  -CBGs -close outpatient f/u

## 2023-08-06 NOTE — Assessment & Plan Note (Addendum)
Has CKD III at baseline. Cr 1.45 > 1.52 > 1.76 this morning - transitioned from IV lasix to torsemide, avoid other nephrotoxic agents.  - Monitor I&O, 1,083ml of output yesterday

## 2023-08-06 NOTE — Consult Note (Addendum)
Advanced Heart Failure Team Consult Note   Primary Physician: Georganna Skeans, MD PCP-Cardiologist:  None  Reason for Consultation: A/C HFrEF   HPI:    Brittany [name] is seen today for evaluation of A/C HFrEF  at the request of Dr Bjorn Pippin.     Ms Brittany [name] is a 62 y.o. female with a hx of PAF,  previous smoker quit 2015, hypertension, previous small cell lung cancer treated with chemo, chest XRT and prophylactic brain radiation in 2015, CAD s/p CABG and chronic systolic HF EF ~25%. Over the years she has been followed closely in the HF clinic.   Most recent ECHO May 2024 EF 20-25% RV mildly reduced.   Admitted with A/C HFrEF. + AECOPD.   CXR with pulmonary edema. BNP 2209, creatinine 1.2, dig level 0.3, and HS Trop 41. Started on steroids + Duonebs. Diuresing with IV lasix+ metolazone. Yesterday switched to torsemide.    Creatinine 1.5>1.5>1.76  Feels better.Tells me she is being discharged today. Appetite improved with steroids.     Review of Systems: [y] = yes, [ ]  = no   General: Weight gain [ ] ; Weight loss [ ] ; Anorexia [ ] ; Fatigue [ Y]; Fever [ ] ; Chills [ ] ; Weakness [ ]   Cardiac: Chest pain/pressure [ ] ; Resting SOB [ ] ; Exertional SOB [ Y]; Orthopnea [ ] ; Pedal Edema [ ] ; Palpitations [ ] ; Syncope [ ] ; Presyncope [ ] ; Paroxysmal nocturnal dyspnea[ ]   Pulmonary: Cough [ ] ; Wheezing[ ] ; Hemoptysis[ ] ; Sputum [ ] ; Snoring [ ]   GI: Vomiting[ ] ; Dysphagia[ ] ; Melena[ ] ; Hematochezia [ ] ; Heartburn[ ] ; Abdominal pain [ ] ; Constipation [ ] ; Diarrhea [ ] ; BRBPR [ ]   GU: Hematuria[ ] ; Dysuria [ ] ; Nocturia[ ]   Vascular: Pain in legs with walking [ ] ; Pain in feet with lying flat [ ] ; Non-healing sores [ ] ; Stroke [ ] ; TIA [ ] ; Slurred speech [ ] ;  Neuro: Headaches[ ] ; Vertigo[ ] ; Seizures[ ] ; Paresthesias[ ] ;Blurred vision [ ] ; Diplopia [ ] ; Vision changes [ ]   Ortho/Skin: Arthritis [ ] ; Joint pain [ Y]; Muscle pain [ ] ; Joint swelling [ ] ; Back Pain [ Y];  Rash [ ]   Psych: Depression[ ] ; Anxiety[ ]   Heme: Bleeding problems [ ] ; Clotting disorders [ ] ; Anemia [ ]   Endocrine: Diabetes [ ] ; Thyroid dysfunction[ ]   Home Medications Prior to Admission medications   Medication Sig Start Date End Date Taking? Authorizing Provider  albuterol (VENTOLIN HFA) 108 (90 Base) MCG/ACT inhaler Inhale 2 puffs into the lungs every 6 (six) hours as needed for wheezing or shortness of breath. 06/20/22  Yes Zannie Cove, MD  apixaban (ELIQUIS) 2.5 MG TABS tablet Take 1 tablet (2.5 mg total) by mouth 2 (two) times daily. 04/17/23  Yes Clegg, Amy D, NP  atorvastatin (LIPITOR) 80 MG tablet Take 1 tablet (80 mg total) by mouth daily. 04/23/23 08/03/23 Yes Darlin Drop, DO  blood glucose meter kit and supplies KIT Dispense based on patient and insurance preference. Use up to four times daily as directed. Patient taking differently: Inject 1 each into the skin See admin instructions. Dispense based on patient and insurance preference. Use up to four times daily as directed. 04/11/21  Yes Osvaldo Shipper, MD  dapagliflozin propanediol (FARXIGA) 10 MG TABS tablet Take 1 tablet by mouth daily. Patient taking differently: Take 10 mg by mouth daily. 09/04/22  Yes Bensimhon, Bevelyn Buckles, MD  digoxin (LANOXIN) 0.125 MG tablet TAKE 1 TABLET BY MOUTH DAILY  04/23/23  Yes [name], Anderson Malta, FNP  Fluticasone-Umeclidin-Vilant (TRELEGY ELLIPTA) 200-62.5-25 MCG/ACT AEPB Inhale 1 puff into the lungs daily. 04/11/23  Yes Olalere, Adewale A, MD  hydrOXYzine (VISTARIL) 25 MG capsule TAKE 1 CAPSULE (25 MG TOTAL) BY MOUTH DAILY AS NEEDED. Patient taking differently: Take 25 mg by mouth daily as needed for anxiety. 07/30/23  Yes Zonia Kief, Amy J, NP  ipratropium-albuterol (DUONEB) 0.5-2.5 (3) MG/3ML SOLN Take 3 mLs by nebulization every 4 (four) hours as needed (for shortness of breath). 07/12/22  Yes Laurey Morale, MD  ivabradine (CORLANOR) 7.5 MG TABS tablet Take 1 tablet (7.5 mg total) by mouth 2 (two)  times daily with a meal. TAKE 1 TABLET (7.5 MG TOTAL) BY MOUTH 2 (TWO) TIMES DAILY WITH A MEAL. 10/10/22  Yes Bensimhon, Bevelyn Buckles, MD  metolazone (ZAROXOLYN) 2.5 MG tablet Take 1 tablet (2.5 mg total) by mouth once a week. Take every Wednesday with extra 40 mEq of Potassium 06/27/23 09/25/23 Yes [name], Fedora, FNP  naphazoline-pheniramine (VISINE) 0.025-0.3 % ophthalmic solution Place 1 drop into both eyes 4 (four) times daily as needed for eye irritation.   Yes [provider]  nitroGLYCERIN (NITROSTAT) 0.4 MG SL tablet Place 1 tablet (0.4 mg total) under the tongue every 5 (five) minutes as needed for chest pain. 02/14/19  Yes Graciella Freer, PA-C  Nutritional Supplements (ENSURE ORIGINAL) LIQD Take 2 Bottles by mouth daily.   Yes [provider]  potassium chloride (KLOR-CON) 10 MEQ tablet Take 4 tablets (40 mEq total) by mouth 2 (two) times daily. Take extra 4 tablets (40 mEq total) with weekly Metolazone dose 06/27/23 09/25/23 Yes [name], Anderson Malta, FNP  spironolactone (ALDACTONE) 25 MG tablet Take 1 tablet (25 mg total) by mouth at bedtime. 05/28/23 08/26/23 Yes [name], Anderson Malta, FNP  torsemide (DEMADEX) 20 MG tablet Take 4 tablets (80 mg total) by mouth 2 (two) times daily. 06/27/23 09/25/23 Yes [name], Anderson Malta, FNP  hydrOXYzine (VISTARIL) 25 MG capsule Take 1 capsule (25 mg total) by mouth daily as needed. Patient not taking: Reported on 08/03/2023 07/30/23   Rema Fendt, NP    Past Medical History: Past Medical History:  Diagnosis Date   Acute respiratory failure (HCC) 05/05/2018   Acute systolic congestive heart failure (HCC) 02/03/2018   AICD (automatic cardioverter/defibrillator) present    Allergy    Anxiety    Asthma    DM2 (diabetes mellitus, type 2) (HCC) 10/20/2020   Hypertension    PAF (paroxysmal atrial fibrillation) (HCC)    Presence of permanent cardiac pacemaker    Prophylactic measure 08/03/14-08/19/14   Prophyl. cranial radiation 24 Gy    S/P emergency CABG x 3 02/03/2018   LIMA to LAD, SVG to D1, SVG to OM1, EVH via right thigh with implantation of Impella LD LVAD via direct aortic approach   Small cell lung cancer (HCC) 03/16/2014    Past Surgical History: Past Surgical History:  Procedure Laterality Date   BRONCHIAL BRUSHINGS  10/25/2020   Procedure: BRONCHIAL BRUSHINGS;  Surgeon: Leslye Peer, MD;  Location: [facility] ENDOSCOPY;  Service: Pulmonary;;   BRONCHIAL NEEDLE ASPIRATION BIOPSY  10/25/2020   Procedure: BRONCHIAL NEEDLE ASPIRATION BIOPSIES;  Surgeon: Leslye Peer, MD;  Location: [facility] ENDOSCOPY;  Service: Pulmonary;;   CARDIAC DEFIBRILLATOR PLACEMENT  08/15/2018   MDT Visia AF MRI VR ICD implanted by Dr Georgena Spurling for primary prevention of sudden   CESAREAN SECTION     CORONARY ARTERY BYPASS GRAFT N/A 02/03/2018  Procedure: CORONARY ARTERY BYPASS GRAFTING (CABG);  Surgeon: Purcell Nails, MD;  Location: [facility] OR;  Service: Open Heart Surgery;  Laterality: N/A;  Time 3 using left internal mammary artery and endoscopically harvested right saphenous vein   CORONARY BALLOON ANGIOPLASTY N/A 02/03/2018   Procedure: CORONARY BALLOON ANGIOPLASTY;  Surgeon: Swaziland, Peter M, MD;  Location: [facility] INVASIVE CV LAB;  Service: Cardiovascular;  Laterality: N/A;   CORONARY STENT INTERVENTION N/A 02/12/2019   Procedure: CORONARY STENT INTERVENTION;  Surgeon: Iran Ouch, MD;  Location: [facility] INVASIVE CV LAB;  Service: Cardiovascular;  Laterality: N/A;   CORONARY/GRAFT ACUTE MI REVASCULARIZATION N/A 02/03/2018   Procedure: Coronary/Graft Acute MI Revascularization;  Surgeon: Swaziland, Peter M, MD;  Location: [facility] INVASIVE CV LAB;  Service: Cardiovascular;  Laterality: N/A;   ENDOBRONCHIAL ULTRASOUND N/A 10/25/2020   Procedure: ENDOBRONCHIAL ULTRASOUND;  Surgeon: Leslye Peer, MD;  Location: [facility] ENDOSCOPY;  Service: Pulmonary;  Laterality: N/A;   FLEXIBLE BRONCHOSCOPY  10/25/2020   Procedure: FLEXIBLE BRONCHOSCOPY;  Surgeon: Leslye Peer,  MD;  Location: [facility] ENDOSCOPY;  Service: Pulmonary;;   IABP INSERTION N/A 02/03/2018   Procedure: IABP Insertion;  Surgeon: Swaziland, Peter M, MD;  Location: [facility] INVASIVE CV LAB;  Service: Cardiovascular;  Laterality: N/A;   INTRAOPERATIVE TRANSESOPHAGEAL ECHOCARDIOGRAM N/A 02/03/2018   Procedure: INTRAOPERATIVE TRANSESOPHAGEAL ECHOCARDIOGRAM;  Surgeon: Purcell Nails, MD;  Location: [facility] OR;  Service: Open Heart Surgery;  Laterality: N/A;   LEFT HEART CATH AND CORONARY ANGIOGRAPHY N/A 02/03/2018   Procedure: LEFT HEART CATH AND CORONARY ANGIOGRAPHY;  Surgeon: Swaziland, Peter M, MD;  Location: [facility] INVASIVE CV LAB;  Service: Cardiovascular;  Laterality: N/A;   LEFT HEART CATH AND CORS/GRAFTS ANGIOGRAPHY N/A 02/12/2019   Procedure: LEFT HEART CATH AND CORS/GRAFTS ANGIOGRAPHY;  Surgeon: Iran Ouch, MD;  Location: [facility] INVASIVE CV LAB;  Service: Cardiovascular;  Laterality: N/A;   MEDIASTINOSCOPY N/A 03/11/2014   Procedure: MEDIASTINOSCOPY;  Surgeon: Loreli Slot, MD;  Location: [facility] OR;  Service: Thoracic;  Laterality: N/A;   PLACEMENT OF IMPELLA LEFT VENTRICULAR ASSIST DEVICE  02/03/2018   Procedure: PLACEMENT OF IMPELLA LEFT VENTRICULAR ASSIST DEVICE LD;  Surgeon: Purcell Nails, MD;  Location: [facility] OR;  Service: Open Heart Surgery;;   REMOVAL OF IMPELLA LEFT VENTRICULAR ASSIST DEVICE N/A 02/08/2018   Procedure: REMOVAL OF IMPELLA LEFT VENTRICULAR ASSIST DEVICE;  Surgeon: Purcell Nails, MD;  Location: [facility] OR;  Service: Open Heart Surgery;  Laterality: N/A;   RIGHT HEART CATH N/A 02/03/2018   Procedure: RIGHT HEART CATH;  Surgeon: Swaziland, Peter M, MD;  Location: [facility] INVASIVE CV LAB;  Service: Cardiovascular;  Laterality: N/A;   RIGHT HEART CATH N/A 05/09/2018   Procedure: RIGHT HEART CATH;  Surgeon: Dolores Patty, MD;  Location: [facility] INVASIVE CV LAB;  Service: Cardiovascular;  Laterality: N/A;   TEE WITHOUT CARDIOVERSION N/A 02/08/2018   Procedure: TRANSESOPHAGEAL ECHOCARDIOGRAM (TEE);  Surgeon: Purcell Nails, MD;  Location: [facility] OR;  Service: Open Heart Surgery;  Laterality: N/A;   TUBAL LIGATION     VIDEO BRONCHOSCOPY WITH ENDOBRONCHIAL ULTRASOUND N/A 03/11/2014   Procedure: VIDEO BRONCHOSCOPY WITH ENDOBRONCHIAL ULTRASOUND;  Surgeon: Loreli Slot, MD;  Location: [facility] OR;  Service: Thoracic;  Laterality: N/A;    Family History: Family History  Problem Relation Age of Onset   Heart disease Mother    Hypertension Mother    Heart attack Mother    Hypertension Maternal Grandmother    Cancer Maternal Grandmother    Diabetes Paternal Grandmother  Stroke Neg Hx     Social History: Social History   Socioeconomic History   Marital status: Married    Spouse name: Tristan Schroeder    Number of children: 1   Years of education: Not on file   Highest education level: Not on file  Occupational History   Occupation: disabled  Tobacco Use   Smoking status: Former    Current packs/day: 0.00    Average packs/day: 1 pack/day for 20.0 years (20.0 ttl pk-yrs)    Types: Cigarettes    Start date: 03/13/1994    Quit date: 03/13/2014    Years since quitting: 9.4   Smokeless tobacco: Never  Vaping Use   Vaping status: Never Used  Substance and Sexual Activity   Alcohol use: Yes    Alcohol/week: 5.0 standard drinks of alcohol    Types: 5 Glasses of wine per week    Comment: a few glasses of wine weekly   Drug use: Yes    Types: Marijuana    Comment: "medicinal", not daily   Sexual activity: Never  Other Topics Concern   Not on file  Social History Narrative   Not on file   Social Determinants of Health   Financial Resource Strain: Medium Risk (02/11/2021)   Overall Financial Resource Strain (CARDIA)    Difficulty of Paying Living Expenses: Somewhat hard  Food Insecurity: No Food Insecurity (08/03/2023)   Hunger Vital Sign    Worried About Running Out of Food in the Last Year: Never true    Ran Out of Food in the Last Year: Never true  Transportation Needs: No Transportation  Needs (08/03/2023)   PRAPARE - Administrator, Civil Service (Medical): No    Lack of Transportation (Non-Medical): No  Physical Activity: Insufficiently Active (02/11/2021)   Exercise Vital Sign    Days of Exercise per Week: 2 days    Minutes of Exercise per Session: 10 min  Stress: Stress Concern Present (02/11/2021)   [employer] of Occupational Health - Occupational Stress Questionnaire    Feeling of Stress : To some extent  Social Connections: Unknown (02/11/2021)   Social Connection and Isolation Panel [NHANES]    Frequency of Communication with Friends and Family: More than three times a week    Frequency of Social Gatherings with Friends and Family: Twice a week    Attends Religious Services: Not on Marketing executive or Organizations: Not on file    Attends Banker Meetings: Never    Marital Status: Married    Allergies:  Allergies  Allergen Reactions   Lactose Intolerance (Gi)     Per patient   Codeine Nausea And Vomiting    Objective:    Vital Signs:   Temp:  [98.1 F (36.7 C)-98.9 F (37.2 C)] 98.5 F (36.9 C) (08/26 0526) Pulse Rate:  [79-99] 92 (08/26 0700) Resp:  [12-23] 17 (08/26 0700) BP: (92-118)/(52-66) 95/66 (08/26 0526) SpO2:  [94 %-100 %] 94 % (08/26 0700) Weight:  [41.4 kg] 41.4 kg (08/26 0532) Last BM Date : 08/05/23  Weight change: Filed Weights   08/04/23 0440 08/05/23 0443 08/06/23 0532  Weight: 42.4 kg 44.1 kg 41.4 kg    Intake/Output:   Intake/Output Summary (Last 24 hours) at 08/06/2023 0816 Last data filed at 08/06/2023 0010 Gross per 24 hour  Intake 870 ml  Output 1900 ml  Net -1030 ml     CVP 1  Physical Exam    General:  Thin, No resp difficulty HEENT: normal Neck: supple. JVP flat  Carotids 2+ bilat; no bruits. No lymphadenopathy or thyromegaly appreciated. Cor: PMI nondisplaced. Regular rate & rhythm. No rubs, gallops or murmurs. Lungs: Rhonchi on room air.  Abdomen: soft,  nontender, nondistended. No hepatosplenomegaly. No bruits or masses. Good bowel sounds. Extremities: no cyanosis, clubbing, rash, edema. RUE PICC Neuro: alert & orientedx3, cranial nerves grossly intact. moves all 4 extremities w/o difficulty. Affect pleasant   Telemetry   SR 80s  EKG    SR 81 bpm   Labs   Basic Metabolic Panel: Recent Labs  Lab 08/04/23 0333 08/04/23 1723 08/05/23 0435 08/05/23 1732 08/06/23 0516  NA 136 138 131* 133* 134*  K 4.0 4.4 4.6 4.5 3.5  CL 99 100 96* 94* 89*  CO2 26 28 27 26 31   GLUCOSE 120* 133* 228* 268* 171*  BUN 30* 40* 42* 51* 64*  CREATININE 1.46* 1.45* 1.52* 1.47* 1.76*  CALCIUM 9.2 9.6 9.7 10.1 10.4*  MG 2.0 2.3 2.4 2.3 2.3    Liver Function Tests: No results for input(s): "AST", "ALT", "ALKPHOS", "BILITOT", "PROT", "ALBUMIN" in the last 168 hours. No results for input(s): "LIPASE", "AMYLASE" in the last 168 hours. No results for input(s): "AMMONIA" in the last 168 hours.  CBC: Recent Labs  Lab 08/03/23 1733 08/03/23 1738  WBC 9.6  --   NEUTROABS 7.7  --   HGB 14.2 15.6*  HCT 44.4 46.0  MCV 92.5  --   PLT 207  --     Cardiac Enzymes: No results for input(s): "CKTOTAL", "CKMB", "CKMBINDEX", "TROPONINI" in the last 168 hours.  BNP: BNP (last 3 results) Recent Labs    05/02/23 1245 05/28/23 0938 08/03/23 1733  BNP 1,263.9* 2,065.6* 2,209.9*    ProBNP (last 3 results) No results for input(s): "PROBNP" in the last 8760 hours.   CBG: Recent Labs  Lab 08/05/23 0607 08/05/23 1108 08/05/23 1519 08/05/23 2110 08/06/23 0608  GLUCAP 285* 235* 285* 225* 233*    Coagulation Studies: No results for input(s): "LABPROT", "INR" in the last 72 hours.   Imaging   No results found.   Medications:     Current Medications:  apixaban  2.5 mg Oral BID   atorvastatin  80 mg Oral Daily   Chlorhexidine Gluconate Cloth  6 each Topical Daily   dapagliflozin propanediol  10 mg Oral Daily   digoxin  125 mcg Oral Daily    feeding supplement  237 mL Oral BID BM   fluticasone furoate-vilanterol  1 puff Inhalation Daily   And   umeclidinium bromide  1 puff Inhalation Daily   insulin aspart  0-9 Units Subcutaneous TID WC   ipratropium-albuterol  3 mL Nebulization Q6H   ivabradine  7.5 mg Oral BID WC   predniSONE  40 mg Oral Q breakfast   sodium chloride flush  10-40 mL Intracatheter Q12H   spironolactone  25 mg Oral QHS   torsemide  80 mg Oral BID    Infusions:     Patient Profile     62 y.o. female with a hx of PAF,  previous smoker quit 2015, hypertension, previous small cell lung cancer treated with chemo, chest XRT and prophylactic brain radiation in 2015, CAD s/p CABG and chronic systolic HF EF ~25%.   Admitted with A/C HFrEF + AECOPD.   Assessment/Plan   1. A/C HFrEF, ICM EF has been reduced since 2019. Has [employer] ICD Most recent ECHO in May 2024. EF 20-25% . \ -  not on bb due to intolerance + COPD Continue current dose of ivabradine 7.5 mg twice a day.  - Continue dig 0.125 mg dialy. Check level  - Continue spiro 25 mg diay  - Continue farxiga 10 mg dialy  Diuresed with IV lasix. CVP down to 1.  Creatinine trending up. Hold torsemide today. Tomorrow start torsemide 80 mg twice a day.  - Not a candidate for advanced therapies due to severe lung disease.   2. PAF Maintaining SR.  On eliquis 2.5 mg twice a day.   3. AECOPD, chronic respiratory failure  H/O 2015  Small Cell Lung Ca treated with chemo + XRT Has oxygen at home.   Per primary   4. CAD S/P CABP 2019 s/p LHC 02/12/19 with DES to the ostial ramus extending into the distal left main.  On high intensity statin + eliquis.   5. H/O R Renal Infarct.    Will set up HF follow up 7-10 days.  -Ivabradine 7.5 mg twice a day.  - Continue dig 0.125 mg daily  - Continue spiro 25 mg diay  - Continue farxiga 10 mg dialy  - Tomorrow start torsemide 80 mg twice a day.  - Potassium 40 meq twice a day start tomorrow.  - Eliquis  2.5 mg twice a day  -Atorvastatin 80 mg daily    Length of Stay: 3  Amy Clegg, NP  08/06/2023, 8:16 AM  Advanced Heart Failure Team Pager [phone] (M-F; 7a - 5p)  Please contact [facility] for night-coverage after hours (4p -7a ) and weekends on [email or web address]  Patient seen with NP, agree with the above note.   Patient was admitted with CHF exacerbation and COPD exacerbation. She has been diuresed with IV Lasix.  CVP 3 on my read today.  She feels much better. She is in NSR.   General: NAD Neck: No JVD, no thyromegaly or thyroid nodule.  Lungs: Distant BS.  CV: Lateral PMI.  Heart regular S1/S2, no S3/S4, no murmur.  No peripheral edema.  No carotid bruit.  Normal pedal pulses.  Abdomen: Soft, nontender, no hepatosplenomegaly, no distention.  Skin: Intact without lesions or rashes.  Neurologic: Alert and oriented x 3.  Psych: Normal affect. Extremities: No clubbing or cyanosis.  HEENT: Normal.   Patient looks euvolemic today with CVP 3.  Creatinine mildly higher at 1.76.  - Hold torsemide today.  - Agree with home on the above meds, restart torsemide 80 mg bid tomorrow.  - Close followup CHF clinic.   Marca Ancona 08/06/2023 2:16 PM

## 2023-08-06 NOTE — Progress Notes (Signed)
Daily Progress Note Intern Pager: [phone]  Patient name: Brittany [name] Medical record number: [id number] Date of birth: 04/22/1961 Age: 62 y.o. Gender: female  Primary Care Provider: Georganna Skeans, MD Consultants: cardiology Code Status: DNR  Pt Overview and Major Events to Date:  8/23 - admitted  Assessment and Plan:  62 year old female who initially presented with shortness of breath requiring BiPAP.  Thought to be secondary to decompensated heart failure in addition to COPD exacerbation.  Low suspicion for bacterial component.  Patient's symptoms have improved significantly after diuresis last 2 days and initiation of prednisone.  Transition to home diuretic regimen.  Plan to discharge today.  Pertinent PMH/PSH includes ICD placement 2019, CABG 2019, coronary stent 2020, SCLC (remission since 2015).  Assessment & Plan Acute on chronic respiratory failure (HCC) Co-ox unlikely solely CHF exacerbation. Not currently requiring O2.  - HF following, appreciate recommendations  - Transition from IV lasix to home torsemide Bid yesterday -Cardiac telemetry while diuresing -K>4, Mg>2 -Continue spironolactone 25 mg at bedtime -AM BMP, mag, TSH -Strict I's and O's, daily weights - Daily prednisone  - Continue Breztri (formulary alternative) and duonebs  - Incentive spirometer  AKI (acute kidney injury) (HCC) Has CKD III at baseline. Cr 1.45 > 1.52 > 1.76 this morning - transitioned from IV lasix to torsemide, avoid other nephrotoxic agents.  - Monitor I&O, 1,051ml of output yesterday Type 2 diabetes mellitus with stage 3a chronic kidney disease, without long-term current use of insulin (HCC) Glucose higher than normal due to prednisone, CBG 233 this morning -Continue home Farxiga 10 mg -SSI sensitive  -CBGs -close outpatient f/u Prolonged QT interval Qtc - caution with QT-prolonging medications  Chronic and Stable Problems: - PAF: has not been in afib  throughout hospital course, pt on eliquis 2.5mg  BID and digoxin QD   FEN/GI: Heart healthy PPx: eliquis Dispo:Home  today . Barriers include may need transportation.   Subjective:  Doing well today.  Much better.  States that she did not require oxygen sleep last night.  Ready to go home  Objective: Temp:  [98.1 F (36.7 C)-98.8 F (37.1 C)] 98.8 F (37.1 C) (08/26 0731) Pulse Rate:  [84-99] 87 (08/26 1124) Resp:  [15-23] 15 (08/26 1124) BP: (92-118)/(54-66) 96/54 (08/26 1124) SpO2:  [93 %-100 %] 93 % (08/26 1124) Weight:  [41.4 kg] 41.4 kg (08/26 0532) Physical Exam: General: Thin, no acute distress Cardiovascular: Regular rate and rhythm, no murmurs, pacemaker in place Respiratory: No increased work of breathing, lungs clear to auscultation bilaterally, significantly improved from initial exam.  Prolonged expiratory phase Abdomen: Soft, nontender Extremities: No swelling or tenderness bilateral lower extremities  Laboratory: Most recent CBC Lab Results  Component Value Date   WBC 9.6 08/03/2023   HGB 15.6 (H) 08/03/2023   HCT 46.0 08/03/2023   MCV 92.5 08/03/2023   PLT 207 08/03/2023   Most recent BMP    Latest Ref Rng & Units 08/06/2023    5:16 AM  BMP  Glucose 70 - 99 mg/dL 237   BUN 8 - 23 mg/dL 64   Creatinine 6.28 - 1.00 mg/dL 3.15   Sodium 176 - 160 mmol/L 134   Potassium 3.5 - 5.1 mmol/L 3.5   Chloride 98 - 111 mmol/L 89   CO2 22 - 32 mmol/L 31   Calcium 8.9 - 10.3 mg/dL 73.7     Other pertinent labs carboxyhemoglobin 2.2   Imaging/Diagnostic Tests: None  Julius Boniface, DO 08/06/2023, 12:39 PM  PGY-1, [facility] FPTS Intern pager: [phone], text pages welcome Secure chat group [facility] Teaching Service

## 2023-08-06 NOTE — Plan of Care (Signed)

## 2023-08-06 NOTE — Discharge Summary (Cosign Needed Addendum)
Family Medicine Teaching [facility] Discharge Summary  Patient name: Brittany [name] Medical record number: [id number] Date of birth: [birth year] Age: 62 y.o. Gender: female Date of Admission: 08/03/2023  Date of Discharge: 08/06/23 Admitting Physician: Para March, DO  Primary Care Provider: Georganna Skeans, MD Consultants: cardiology/HF team  Indication for Hospitalization: shortness of breath requiring BIPAP  Discharge Diagnoses/Problem List:  Principal Problem for Admission: acute on chronic respiratory failure  Other Problems addressed during stay:  Principal Problem:   Acute on chronic respiratory failure (HCC) Active Problems:   Type 2 diabetes mellitus with stage 3a chronic kidney disease, without long-term current use of insulin (HCC)   Prolonged QT interval   AKI (acute kidney injury) [facility])  Brief Hospital Course:  Brittany [name] is a 62 y.o.female with a history of HFrEF (EF 20-25%), HTN, T2DM, CAD s/p emergent CABG 2019, COPD w/ h/o HAP, small cell lung cancer, and Afib who was admitted to the [facility] Medicine Teaching Service at [facility] for suspected decompensated heart failure in addition to COPD exacerbation. Her hospital course is detailed below:  Acute on chronic respiratory failure Presented with 2 to 3 days of dyspnea, cough, and left-sided chest tightness.  Patient is not on oxygen at home but required 2 L of O2 and then BiPAP in the ED.  CXR with cardiomegaly and pulmonary edema.  BNP elevated to 2209.  Negative COVID.  Shortness of breath improved with BiPAP, however initial diuresis with 80mg  Lasix x2 was unsuccessful in promoting sufficient output.  Cardiology/HF team consulted and placed PICC for CVP/co-oximetry while diuresing with Lasix and addition of metolazone. They felt this was not CHF exacerbation, more related to COPD. Prednisone 40mg  was started 08/04/23 for 5 day course. After diuresis, breztri, and duonebs, pt's work of breathing  improved significantly. Pt discharged with two more days of prednisone. Dextromethorphan-guaifenesin for cough as needed, and methocarbamol as needed for muscle spasms related to coughing.   AKI Creatinine admission was 1.21, likely near baseline.  However with IV diuresis, did trend up to 1.9 by discharge.  Recommended patient stay 1 more night with diuretics held to see if creatinine trended down, however patient decided to go home with close outpatient follow-up.  States that her PCP has an appointment as early as tomorrow.  She will hold her torsemide tonight and tomorrow morning.  T2DM Pt had some elevated BGLs while on prednisone. Home Farxiga 10mg  was continued along with sensitive SSI.   Prolonged QT interval QTc on initial EKG was 521 ms, used caution with QT prolonging medications, including avoiding azithromycin for possible COPD exacerbation.  Other chronic conditions were medically managed with home medications and formulary alternatives as necessary (A-fib, COPD, T2DM)  PCP Follow-up Recommendations: Repeat BMP to evaluate for resolution of AKI, monitor electrolytes status post diuresis. Evaluate BGLs to ensure they came back down after prednisone course completed The number for [facility] was provided given imaging hx of renal infarct  Disposition: home  Discharge Condition: stable  Discharge Exam:  Vitals:   08/06/23 1405 08/06/23 1427  BP:    Pulse: 95 95  Resp: 19 (!) 32  Temp:    SpO2: 95% 95%   Physical Exam: General: Thin, no acute distress Cardiovascular: Regular rate and rhythm, no murmurs, pacemaker in place Respiratory: No increased work of breathing, lungs clear to auscultation bilaterally, significantly improved from initial exam.  Prolonged expiratory phase Abdomen: Soft, nontender Extremities: No swelling or tenderness bilateral lower extremities  Significant Procedures: PICC placed 8/24,  removed prior to discharge  Significant Labs and  Imaging:  No results for input(s): "WBC", "HGB", "HCT", "PLT" in the last 48 hours. Recent Labs  Lab 08/05/23 0435 08/05/23 1732 08/06/23 0516 08/06/23 1400  NA 131* 133* 134* 131*  K 4.6 4.5 3.5 4.0  CL 96* 94* 89* 86*  CO2 27 26 31 30   GLUCOSE 228* 268* 171* 249*  BUN 42* 51* 64* 75*  CREATININE 1.52* 1.47* 1.76* 1.90*  CALCIUM 9.7 10.1 10.4* 10.2  MG 2.4 2.3 2.3  --    Carboxyhemoglobin 2.2, methemoglobin 1.4 (WNL) CXR on admission IMPRESSION: 1. Cardiomegaly with pulmonary edema. 2. Blunting of the costophrenic angles may be due to hyperinflation or pleural effusions.  Results/Tests Pending at Time of Discharge: none  Discharge Medications:  Allergies as of 08/06/2023       Reactions   Lactose Intolerance (gi)    Per patient   Codeine Nausea And Vomiting        Medication List     TAKE these medications    albuterol 108 (90 Base) MCG/ACT inhaler Commonly known as: VENTOLIN HFA Inhale 2 puffs into the lungs every 6 (six) hours as needed for wheezing or shortness of breath.   apixaban 2.5 MG Tabs tablet Commonly known as: ELIQUIS Take 1 tablet (2.5 mg total) by mouth 2 (two) times daily.   atorvastatin 80 MG tablet Commonly known as: LIPITOR Take 1 tablet (80 mg total) by mouth daily.   blood glucose meter kit and supplies Kit Dispense based on patient and insurance preference. Use up to four times daily as directed. What changed:  how much to take how to take this when to take this   dapagliflozin propanediol 10 MG Tabs tablet Commonly known as: Farxiga Take 1 tablet by mouth daily. What changed:  how much to take how to take this when to take this additional instructions   Dextromethorphan-guaiFENesin 10-100 MG/5ML liquid Take 5 mLs by mouth every 4 (four) hours as needed for cough (chest congestion).   digoxin 0.125 MG tablet Commonly known as: LANOXIN TAKE 1 TABLET BY MOUTH DAILY   Ensure Original Liqd Take 2 Bottles by mouth  daily.   hydrOXYzine 25 MG capsule Commonly known as: VISTARIL TAKE 1 CAPSULE (25 MG TOTAL) BY MOUTH DAILY AS NEEDED. What changed:  reasons to take this Another medication with the same name was removed. Continue taking this medication, and follow the directions you see here.   ipratropium-albuterol 0.5-2.5 (3) MG/3ML Soln Commonly known as: DUONEB Take 3 mLs by nebulization every 4 (four) hours as needed (for shortness of breath).   ivabradine 7.5 MG Tabs tablet Commonly known as: Corlanor Take 1 tablet (7.5 mg total) by mouth 2 (two) times daily with a meal. TAKE 1 TABLET (7.5 MG TOTAL) BY MOUTH 2 (TWO) TIMES DAILY WITH A MEAL.   methocarbamol 500 MG tablet Commonly known as: ROBAXIN Take 1 tablet (500 mg total) by mouth 2 (two) times daily as needed for up to 3 days for muscle spasms.   metolazone 2.5 MG tablet Commonly known as: ZAROXOLYN Take 1 tablet (2.5 mg total) by mouth once a week. Take every Wednesday with extra 40 mEq of Potassium   nitroGLYCERIN 0.4 MG SL tablet Commonly known as: NITROSTAT Place 1 tablet (0.4 mg total) under the tongue every 5 (five) minutes as needed for chest pain.   potassium chloride 10 MEQ tablet Commonly known as: KLOR-CON Take 4 tablets (40 mEq total) by mouth 2 (two)  times daily. Take extra 4 tablets (40 mEq total) with weekly Metolazone dose   predniSONE 20 MG tablet Commonly known as: DELTASONE Take 2 tablets (40 mg total) by mouth daily with breakfast. Start taking on: August 07, 2023   spironolactone 25 MG tablet Commonly known as: ALDACTONE Take 1 tablet (25 mg total) by mouth at bedtime.   torsemide 20 MG tablet Commonly known as: DEMADEX Take 4 tablets (80 mg total) by mouth 2 (two) times daily.   Trelegy Ellipta 200-62.5-25 MCG/ACT Aepb Generic drug: Fluticasone-Umeclidin-Vilant Inhale 1 puff into the lungs daily.   Visine 0.025-0.3 % ophthalmic solution Generic drug: naphazoline-pheniramine Place 1 drop into both  eyes 4 (four) times daily as needed for eye irritation.       Discharge Instructions: Please refer to Patient Instructions section of EMR for full details.  Patient was counseled important signs and symptoms that should prompt return to medical care, changes in medications, dietary instructions, activity restrictions, and follow up appointments.   Follow-Up Appointments:  Follow-up Information      [facility] Follow up on 08/16/2023.   Specialty: Cardiology Why: at [address] Contact information: [address] [place] [address] [phone]        Georganna Skeans, MD Follow up.   Specialty: Family Medicine Why: The office will call patient. Contact information: [address] [employer] [address] [place] [address] [phone]                Para March, DO 08/06/2023, 4:28 PM PGY-1, [facility]   I was personally present and performed medical decision making activities of this service and have verified that the service and findings are accurately documented in the resident's note.  Shelby Mattocks, DO                  08/06/2023, 5:43 PM

## 2023-08-06 NOTE — Assessment & Plan Note (Signed)
Qtc - caution with QT-prolonging medications

## 2023-08-06 NOTE — Inpatient Diabetes Management (Signed)
Inpatient Diabetes Program Recommendations  AACE/ADA: New Consensus Statement on Inpatient Glycemic Control (2015)  Target Ranges:  Prepandial:   less than 140 mg/dL      Peak postprandial:   less than 180 mg/dL (1-2 hours)      Critically ill patients:  140 - 180 mg/dL   Lab Results  Component Value Date   GLUCAP 268 (H) 08/06/2023   HGBA1C 6.1 (H) 08/05/2023    Review of Glycemic Control  Latest Reference Range & Units 08/05/23 06:07 08/05/23 11:08 08/05/23 15:19 08/05/23 21:10 08/06/23 06:08 08/06/23 11:19  Glucose-Capillary 70 - 99 mg/dL 638 (H) 756 (H) 433 (H) 225 (H) 233 (H) 268 (H)  (H): Data is abnormally high  Diabetes history: Pre-DM Outpatient Diabetes medications: none Current orders for Inpatient glycemic control: Novolog 0-9 units TID, Farxiga 10 mg every day, Prednisone 40 mg QAM  Inpatient Diabetes Program Recommendations:    Might consider:  Semglee 8 units every day while receiving steroids (0.2 units X 41.4 kg)  Will continue to follow while inpatient.  Thank you, Dulce Sellar, MSN, CDCES Diabetes Coordinator Inpatient Diabetes Program [phone] (team pager from 8a-5p)

## 2023-08-06 NOTE — Assessment & Plan Note (Addendum)
Co-ox unlikely solely CHF exacerbation. Not currently requiring O2.  - HF following, appreciate recommendations  - Transition from IV lasix to home torsemide Bid yesterday -Cardiac telemetry while diuresing -K>4, Mg>2 -Continue spironolactone 25 mg at bedtime -AM BMP, mag, TSH -Strict I's and O's, daily weights - Daily prednisone  - Continue Breztri (formulary alternative) and duonebs  - Incentive spirometer

## 2023-08-07 ENCOUNTER — Telehealth: Payer: Self-pay

## 2023-08-07 NOTE — Transitions of Care (Post Inpatient/ED Visit) (Signed)
   08/07/2023  Name: Brittany [name] MRN: [id number] DOB: February 28, 1961  Today's TOC FU Call Status: Today's TOC FU Call Status:: Successful TOC FU Call Completed TOC FU Call Complete Date: 08/07/23 Patient's Name and Date of Birth confirmed.  Transition Care Management Follow-up Telephone Call Date of Discharge: 08/06/23 Discharge Facility: [facility]) Type of Discharge: Inpatient Admission Primary Inpatient Discharge Diagnosis:: CHF How have you been since you were released from the hospital?: Better Any questions or concerns?: No  Items Reviewed: Did you receive and understand the discharge instructions provided?: Yes Medications obtained,verified, and reconciled?: No Medications Not Reviewed Reasons:: Other: (She said she has all of her medications and did not need to review the list because it was done prior to leaving the hospital.  She did not have any questions about the meds.  She also has a glucometer, nebulizer and scale.) Any new allergies since your discharge?: No Dietary orders reviewed?: Yes Type of Diet Ordered:: heart healthy low sodium Do you have support at home?: Yes People in Home: alone Name of Support/Comfort Primary Source: lives alone but said her sister checks on her  Medications Reviewed Today: Medications Reviewed Today   Medications were not reviewed in this encounter     Home Care and Equipment/Supplies: Were Home Health Services Ordered?: No Any new equipment or medical supplies ordered?: No  Functional Questionnaire: Do you need assistance with bathing/showering or dressing?: No Do you need assistance with meal preparation?: No Do you need assistance with eating?: No Do you have difficulty maintaining continence: No Do you need assistance with getting out of bed/getting out of a chair/moving?: Yes (she uses a rollator with ambulation) Do you have difficulty managing or taking your medications?: No  Follow up appointments  reviewed: PCP Follow-up appointment confirmed?: Yes Date of PCP follow-up appointment?: 08/22/23 Follow-up Provider: Dr Andrey Campanile [facility] Follow-up appointment confirmed?: Yes Date of Specialist follow-up appointment?: 08/16/23 Follow-Up Specialty Provider:: [facility] Do you need transportation to your follow-up appointment?: No Do you understand care options if your condition(s) worsen?: Yes-patient verbalized understanding    SIGNATURE Robyne Peers, RN

## 2023-08-14 ENCOUNTER — Ambulatory Visit: Payer: BC Managed Care – PPO | Attending: Cardiology

## 2023-08-14 ENCOUNTER — Telehealth: Payer: Self-pay | Admitting: Pulmonary Disease

## 2023-08-14 DIAGNOSIS — Z95810 Presence of automatic (implantable) cardiac defibrillator: Secondary | ICD-10-CM

## 2023-08-14 DIAGNOSIS — I5022 Chronic systolic (congestive) heart failure: Secondary | ICD-10-CM

## 2023-08-14 NOTE — Telephone Encounter (Signed)
Patient calling in to get Trelegy refill sent in [id number] - [place], [place] - [address]

## 2023-08-16 ENCOUNTER — Encounter (HOSPITAL_COMMUNITY): Payer: Self-pay

## 2023-08-16 ENCOUNTER — Ambulatory Visit (HOSPITAL_COMMUNITY)
Admit: 2023-08-16 | Discharge: 2023-08-16 | Disposition: A | Discharge status: Home / Self Care | Payer: BC Managed Care – PPO | Source: Ambulatory Visit | Attending: Physician Assistant | Admitting: Physician Assistant

## 2023-08-16 VITALS — BP 100/74 | HR 81 | Ht 59.0 in | Wt 95.6 lb

## 2023-08-16 DIAGNOSIS — I2510 Atherosclerotic heart disease of native coronary artery without angina pectoris: Secondary | ICD-10-CM

## 2023-08-16 DIAGNOSIS — R9431 Abnormal electrocardiogram [ECG] [EKG]: Secondary | ICD-10-CM | POA: Insufficient documentation

## 2023-08-16 DIAGNOSIS — I5022 Chronic systolic (congestive) heart failure: Secondary | ICD-10-CM

## 2023-08-16 DIAGNOSIS — Z85118 Personal history of other malignant neoplasm of bronchus and lung: Secondary | ICD-10-CM | POA: Diagnosis not present

## 2023-08-16 DIAGNOSIS — Z951 Presence of aortocoronary bypass graft: Secondary | ICD-10-CM | POA: Diagnosis not present

## 2023-08-16 DIAGNOSIS — I255 Ischemic cardiomyopathy: Secondary | ICD-10-CM

## 2023-08-16 DIAGNOSIS — Z7901 Long term (current) use of anticoagulants: Secondary | ICD-10-CM | POA: Insufficient documentation

## 2023-08-16 DIAGNOSIS — I480 Paroxysmal atrial fibrillation: Secondary | ICD-10-CM

## 2023-08-16 DIAGNOSIS — J441 Chronic obstructive pulmonary disease with (acute) exacerbation: Secondary | ICD-10-CM | POA: Diagnosis not present

## 2023-08-16 DIAGNOSIS — J9611 Chronic respiratory failure with hypoxia: Secondary | ICD-10-CM | POA: Insufficient documentation

## 2023-08-16 DIAGNOSIS — I110 Hypertensive heart disease with heart failure: Secondary | ICD-10-CM | POA: Diagnosis present

## 2023-08-16 DIAGNOSIS — I252 Old myocardial infarction: Secondary | ICD-10-CM | POA: Diagnosis not present

## 2023-08-16 DIAGNOSIS — Z87891 Personal history of nicotine dependence: Secondary | ICD-10-CM | POA: Diagnosis not present

## 2023-08-16 DIAGNOSIS — N2889 Other specified disorders of kidney and ureter: Secondary | ICD-10-CM | POA: Diagnosis not present

## 2023-08-16 LAB — COMPREHENSIVE METABOLIC PANEL
ALT: 25 U/L (ref 0–44)
AST: 32 U/L (ref 15–41)
Albumin: 3.8 g/dL (ref 3.5–5.0)
Alkaline Phosphatase: 81 U/L (ref 38–126)
Anion gap: 11 (ref 5–15)
BUN: 29 mg/dL — ABNORMAL HIGH (ref 8–23)
CO2: 33 mmol/L — ABNORMAL HIGH (ref 22–32)
Calcium: 9.4 mg/dL (ref 8.9–10.3)
Chloride: 95 mmol/L — ABNORMAL LOW (ref 98–111)
Creatinine, Ser: 1.43 mg/dL — ABNORMAL HIGH (ref 0.44–1.00)
GFR, Estimated: 41 mL/min — ABNORMAL LOW (ref >60)
Glucose, Bld: 139 mg/dL — ABNORMAL HIGH (ref 70–99)
Potassium: 3.3 mmol/L — ABNORMAL LOW (ref 3.5–5.1)
Sodium: 139 mmol/L (ref 135–145)
Total Bilirubin: 1.1 mg/dL (ref 0.3–1.2)
Total Protein: 7.4 g/dL (ref 6.5–8.1)

## 2023-08-16 LAB — BRAIN NATRIURETIC PEPTIDE: B Natriuretic Peptide: 1342.5 pg/mL — ABNORMAL HIGH (ref 0.0–100.0)

## 2023-08-16 MED ORDER — TRELEGY ELLIPTA 200-62.5-25 MCG/ACT IN AEPB: 1.0000 | INHALATION_SPRAY | Freq: Every day | RESPIRATORY_TRACT | 0 refills | Status: DC

## 2023-08-16 NOTE — Progress Notes (Signed)
EPIC Encounter for ICM Monitoring  Patient Name: Brittany [name] is a 62 y.o. female Date: 08/16/2023 Primary Care Physican: Georganna Skeans, MD Primary Cardiologist: Bensimhon Electrophysiologist: Lalla Brothers 02/13/2023 Weight: 100-102 lbs 03/20/2023 Weight: 100 lbs 05/22/2023 Weight: 96 lbs 06/26/2023 Weight: 96-98 lbs   Pt has HF clinic appt on 9/5 for follow up after hospitalization.   Transmission reviewed.  8/23-8/26 Hospitalization.     Optivol Thoracic impedance suggesting fluid levels returned to normal 8/20.      Prescribed: Torsemide 20 mg take 4 tablets (80 mg total) by mouth twice a day.    Potassium 10 mEq take 4 tablets (40 mEq total) by mouth twice a day.  Take extra 4 tablets (40 mEq total) with weekly Metolazone.   Spironolactone 25 mg take 1 tablet (25 mg total) by mouth at bedtime Metolazone 2.5 mg take 1 tablet by mouth every week on Wednesday.  (Started 7/23)   Labs: 08/06/2023 Creatinine 1.90, BUN 75, Potassium 4.0, Sodium 131, GFR 29  08/05/2023 Creatinine 1.47, BUN 51, Potassium 4.5, Sodium 133, GFR 40  08/04/2023 Creatinine 1.45, BUN 40, Potassium 4.4, Sodium 138, GFR 41  08/03/2023 Creatinine 1.20, BUN 36, Potassium 3.6, Sodium 141 05/28/2023 Creatinine 1.27, BUN 29, Potassium 3.5, Sodium 137, GFR 48 A complete set of results can be found in Results Review.   Recommendations:  Recommendations given at 9/5 HF clinic OV   Follow-up plan: ICM clinic phone appointment on 08/27/2023 to recheck fluid levels.   91 day device clinic remote transmission 09/19/2023.     EP/Cardiology Office Visits:  08/16/2023 with HF clinic.   10/10/2023 with Dr Lalla Brothers.   Copy of ICM check sent to Dr. Lalla Brothers.    3 month ICM trend: 08/14/2023.    12-14 Month ICM trend:     Karie Soda, RN 08/16/2023 1:46 PM

## 2023-08-16 NOTE — Addendum Note (Signed)
Encounter addended by: Andrey Farmer, PA-C on: 08/16/2023 4:51 PM  Actions taken: Clinical Note Signed

## 2023-08-16 NOTE — Telephone Encounter (Signed)
Trelegy refilled, patient needs follow up appointment.

## 2023-08-16 NOTE — Progress Notes (Addendum)
Advanced Heart Failure Clinic Note  PCP:  Georganna Skeans, MD  HF Cardiologist: Dr [name] Oncology: Dr Shirline Frees   Chief Complaint: Hospital follow-up  HPI: Brittany [name] is a 62 y.o.[place] Kitchen female with a history of PAF,  previous smoker quit 2015  years ago, hypertension, previous small cell lung cancer treated with chemo, chest XRT and prophylactic brain radiation in 2015, CAD s/p CABG and chronic systolic HF EF ~25%  Admitted 03/19/8118 with NSTEMI and shock. Underwent emergent cath 02/03/18 showed LAD 100% stenosed, LCx 95% stenosed. Taken for emergent CABG 02/03/18. Required impella post op. Hospital course complicated by cardiogenic shock, HCAP, A fib, respiratory failure, and swallowing issues. She was discharged to SNF. Discharge weight 103 pounds.    In 2019 had multiple hospitalizations for HF and pleural effusion. Underwent pleurodesis at [facility].   Admitted 3/20 with NSTEMI and HF. Received DES to ostial ramus into distal left main based on cath below and underwent diuresis. Meds adjusted as tolerated. Echo with EF 25-30%.   Admitted 11/21 with COPD flare with mild HF and hemoptysis. CTA showed stable scarring in the right hilum consistent with emphysema.. Underwent bronchoscopy on 11/15, no obvious source of bleeding found on examination.   Echo 11/21 EF 20-25%. RV mildly HK.   Admitted 3/22 Echo with EF < 20%. Treated for AECOPD exacerbation.   Admitted 12/22 with R renal infarct felt to be cardio-embolic (no AF found). Started on Eliquis  Admitted 8/23 w/ a/c respiratory failure and CHF. Concern for low output vs PNA vs COPD exacerbation. PICC placed and milrinone started. Diuresed w/ IV Lasix. Given prednisone taper and doxy for COPD exacerbation. Echo showed EF 20-25%, global hypokinesis, GIIDD, RV systolic function, mildly reduced. RV size normal. Mild MR, trivial TR, aortic valve w/ mild-mod regurg.  Barostim placed.   Admitted 12/23 with COPDE, a/c CHF and  influenza +. Placed on BiPap, treated with tamiflu and diuresed with IV lasix. Empirically started milrinone over concern for low output  Admitted 5/24 with N/V. CT ok, wet prep + trich, treated with IV Flagyl.  HsTroponin elevated. Cards consulted felt elevated troponin 2/2 demand ischemia. Echo showed EF 20-25%, LV with global HK, RV mildly reduced, moderate AI. [name] and KCL stopped with AKI and she was discharged home, weight 101 lbs.  Admitted 08/24 with a/c CHF and AECOPD. Treated with steroids and duonebs and diuresed with IV lasix + metolazone. She was seen by Advanced Heart Failure. Discharged home on 80 mg Torsemide BID.  She is here today for hospital follow-up.  She feels that she has been doing well since discharge. Home weight has been stable around 92 lb. Denies shortness of breath, orthopnea, PND or lower extremity edema. Feels she has done better after Barostim. She is prescribed Torsemide 80 BID but takes 80 q am and 40 q pm, taking 40 mEq potassium daily instead of BID. She feels she has been on too much diuretic.   Denies tobacco use. Has a few glasses of wine a week.    Cardidac Studies: - Echo (5/24): EF 20-25%, LV with global HK, RV mildly reduced, moderate AI.  - Echo (7/23): EF 20-25%, global hypokinesis, GIIDD, RV systolic function, mildly reduced. RV size normal. Mild MR, trivial TR, aortic valve w/ mild-mod regurg  - Echo 1/23: EF 20-25%, severely reduced LV with global HK, grade II DD, normal RV  - Echo 02/10/21: EF <20%, G2DD  - Echo 2022: EF < 20% RV normal   -  Echo 2021: EF 20-25% RV mildly reduced.   - Echo 02/12/19: EF 25-30%, RV mildly reduced  - 05/18/2021 PFT's: moderately severe airway obstruction and a diffusion defect suggesting emphysema.   - LHC 02/12/19: Ost LAD to Prox LAD lesion is 100% stenosed. Ost Cx to Prox Cx lesion is 100% stenosed. Ost Ramus lesion is 99% stenosed. Post intervention, there is a 0% residual stenosis. A drug-eluting stent  was successfully placed using a STENT RESOLUTE ONYX 2.25X15. SVG and is normal in caliber. The graft exhibits no disease. Ost 1st Diag lesion is 70% stenosed. SVG and is normal in caliber. Prox Graft lesion is 100% stenosed. 1.  Significant underlying two-vessel coronary artery disease with patent SVG to OM and SVG to diagonal which supplies the LAD territory.  Atretic LIMA to LAD given competitive flow from SVG to diagonal.  Severe ostial ramus artery stenosis not supplied by bypass with his area suggestive of plaque rupture. 2.  Severely elevated left ventricular end-diastolic pressure 34 mmHg.  Left ventricular angiography was not performed. 3.  Successful angioplasty and drug-eluting stent placement to the ostial ramus extending into the distal left main.  - RHC 2019  RA = 8 RV = 49/9 PA = 47/20 (32) PCW = 19 Fick cardiac output/index = 4.3/3.1 PVR = 3.0 wu AO sat = 93% PA sat = 62%, 64%   1. Minimally elevated left heart pressures 2. Mild PAH 3. Normal cardiac output  Past Medical History:  Diagnosis Date   Acute respiratory failure (HCC) 05/05/2018   Acute systolic congestive heart failure (HCC) 02/03/2018   AICD (automatic cardioverter/defibrillator) present    Allergy    Anxiety    Asthma    DM2 (diabetes mellitus, type 2) (HCC) 10/20/2020   Hypertension    PAF (paroxysmal atrial fibrillation) (HCC)    Presence of permanent cardiac pacemaker    Prophylactic measure 08/03/14-08/19/14   Prophyl. 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   LIMA to LAD, SVG to D1, SVG to OM1, EVH via right thigh with implantation of Impella LD LVAD via direct aortic approach   Small cell lung cancer (HCC) 03/16/2014   Past Surgical History:  Procedure Laterality Date   BRONCHIAL BRUSHINGS  10/25/2020   Procedure: BRONCHIAL BRUSHINGS;  Surgeon: Leslye Peer, MD;  Location: [facility] ENDOSCOPY;  Service: Pulmonary;;   BRONCHIAL NEEDLE ASPIRATION BIOPSY  10/25/2020    Procedure: BRONCHIAL NEEDLE ASPIRATION BIOPSIES;  Surgeon: Leslye Peer, MD;  Location: [facility] ENDOSCOPY;  Service: Pulmonary;;   CARDIAC DEFIBRILLATOR PLACEMENT  08/15/2018   MDT Visia AF MRI VR ICD implanted by Dr Georgena Spurling for primary prevention of sudden   CESAREAN SECTION     CORONARY ARTERY BYPASS GRAFT N/A 02/03/2018   Procedure: CORONARY ARTERY BYPASS GRAFTING (CABG);  Surgeon: Purcell Nails, MD;  Location: [facility] OR;  Service: Open Heart Surgery;  Laterality: N/A;  Time 3 using left internal mammary artery and endoscopically harvested right saphenous vein   CORONARY BALLOON ANGIOPLASTY N/A 02/03/2018   Procedure: CORONARY BALLOON ANGIOPLASTY;  Surgeon: Swaziland, Peter M, MD;  Location: [facility] INVASIVE CV LAB;  Service: Cardiovascular;  Laterality: N/A;   CORONARY STENT INTERVENTION N/A 02/12/2019   Procedure: CORONARY STENT INTERVENTION;  Surgeon: Iran Ouch, MD;  Location: [facility] INVASIVE CV LAB;  Service: Cardiovascular;  Laterality: N/A;   CORONARY/GRAFT ACUTE MI REVASCULARIZATION N/A 02/03/2018   Procedure: Coronary/Graft Acute MI Revascularization;  Surgeon: Swaziland, Peter M, MD;  Location: [facility] INVASIVE CV  LAB;  Service: Cardiovascular;  Laterality: N/A;   ENDOBRONCHIAL ULTRASOUND N/A 10/25/2020   Procedure: ENDOBRONCHIAL ULTRASOUND;  Surgeon: Leslye Peer, MD;  Location: [facility] ENDOSCOPY;  Service: Pulmonary;  Laterality: N/A;   FLEXIBLE BRONCHOSCOPY  10/25/2020   Procedure: FLEXIBLE BRONCHOSCOPY;  Surgeon: Leslye Peer, MD;  Location: [facility] ENDOSCOPY;  Service: Pulmonary;;   IABP INSERTION N/A 02/03/2018   Procedure: IABP Insertion;  Surgeon: Swaziland, Peter M, MD;  Location: [facility] INVASIVE CV LAB;  Service: Cardiovascular;  Laterality: N/A;   INTRAOPERATIVE TRANSESOPHAGEAL ECHOCARDIOGRAM N/A 02/03/2018   Procedure: INTRAOPERATIVE TRANSESOPHAGEAL ECHOCARDIOGRAM;  Surgeon: Purcell Nails, MD;  Location[facility] OR;  Service: Open Heart Surgery;  Laterality: N/A;   LEFT HEART CATH AND CORONARY ANGIOGRAPHY  N/A 02/03/2018   Procedure: LEFT HEART CATH AND CORONARY ANGIOGRAPHY;  Surgeon: Swaziland, Peter M, MD;  Location: [facility] INVASIVE CV LAB;  Service: Cardiovascular;  Laterality: N/A;   LEFT HEART CATH AND CORS/GRAFTS ANGIOGRAPHY N/A 02/12/2019   Procedure: LEFT HEART CATH AND CORS/GRAFTS ANGIOGRAPHY;  Surgeon: Iran Ouch, MD;  Location: [facility] INVASIVE CV LAB;  Service: Cardiovascular;  Laterality: N/A;   MEDIASTINOSCOPY N/A 03/11/2014   Procedure: MEDIASTINOSCOPY;  Surgeon: Loreli Slot, MD;  Location: [facility] OR;  Service: Thoracic;  Laterality: N/A;   PLACEMENT OF IMPELLA LEFT VENTRICULAR ASSIST DEVICE  02/03/2018   Procedure: PLACEMENT OF IMPELLA LEFT VENTRICULAR ASSIST DEVICE LD;  Surgeon: Purcell Nails, MD;  Location: [facility] OR;  Service: Open Heart Surgery;;   REMOVAL OF IMPELLA LEFT VENTRICULAR ASSIST DEVICE N/A 02/08/2018   Procedure: REMOVAL OF IMPELLA LEFT VENTRICULAR ASSIST DEVICE;  Surgeon: Purcell Nails, MD;  Location: [facility] OR;  Service: Open Heart Surgery;  Laterality: N/A;   RIGHT HEART CATH N/A 02/03/2018   Procedure: RIGHT HEART CATH;  Surgeon: Swaziland, Peter M, MD;  Location: [facility] INVASIVE CV LAB;  Service: Cardiovascular;  Laterality: N/A;   RIGHT HEART CATH N/A 05/09/2018   Procedure: RIGHT HEART CATH;  Surgeon: Dolores Patty, MD;  Location: [facility] INVASIVE CV LAB;  Service: Cardiovascular;  Laterality: N/A;   TEE WITHOUT CARDIOVERSION N/A 02/08/2018   Procedure: TRANSESOPHAGEAL ECHOCARDIOGRAM (TEE);  Surgeon: Purcell Nails, MD;  Location: [facility] OR;  Service: Open Heart Surgery;  Laterality: N/A;   TUBAL LIGATION     VIDEO BRONCHOSCOPY WITH ENDOBRONCHIAL ULTRASOUND N/A 03/11/2014   Procedure: VIDEO BRONCHOSCOPY WITH ENDOBRONCHIAL ULTRASOUND;  Surgeon: Loreli Slot, MD;  Location: [facility] OR;  Service: Thoracic;  Laterality: N/A;   Current Outpatient Medications  Medication Sig Dispense Refill   albuterol (VENTOLIN HFA) 108 (90 Base) MCG/ACT inhaler Inhale 2 puffs into the lungs every 6  (six) hours as needed for wheezing or shortness of breath. 8.5 g 1   apixaban (ELIQUIS) 2.5 MG TABS tablet Take 1 tablet (2.5 mg total) by mouth 2 (two) times daily. 180 tablet 1   atorvastatin (LIPITOR) 80 MG tablet Take 1 tablet (80 mg total) by mouth daily. 90 tablet 0   blood glucose meter kit and supplies KIT Dispense based on patient and insurance preference. Use up to four times daily as directed. (Patient taking differently: Inject 1 each into the skin See admin instructions. Dispense based on patient and insurance preference. Use up to four times daily as directed.) 1 each 0   dapagliflozin propanediol (FARXIGA) 10 MG TABS tablet Take 1 tablet by mouth daily. (Patient taking differently: Take 10 mg by mouth daily.) 30 tablet 11   digoxin (LANOXIN) 0.125 MG tablet TAKE 1  TABLET BY MOUTH DAILY 90 tablet 3   Fluticasone-Umeclidin-Vilant (TRELEGY ELLIPTA) 200-62.5-25 MCG/ACT AEPB Inhale 1 puff into the lungs daily. 60 each 0   guaiFENesin-dextromethorphan (ROBITUSSIN DM) 100-10 MG/5ML syrup Take 5 mLs by mouth every 4 (four) hours as needed for cough (chest congestion). 118 mL 0   hydrOXYzine (VISTARIL) 25 MG capsule TAKE 1 CAPSULE (25 MG TOTAL) BY MOUTH DAILY AS NEEDED. (Patient taking differently: Take 25 mg by mouth daily as needed for anxiety.) 90 capsule 1   ipratropium-albuterol (DUONEB) 0.5-2.5 (3) MG/3ML SOLN Take 3 mLs by nebulization every 4 (four) hours as needed (for shortness of breath). 90 mL 3   ivabradine (CORLANOR) 7.5 MG TABS tablet Take 1 tablet (7.5 mg total) by mouth 2 (two) times daily with a meal. TAKE 1 TABLET (7.5 MG TOTAL) BY MOUTH 2 (TWO) TIMES DAILY WITH A MEAL. 180 tablet 1   naphazoline-pheniramine (VISINE) 0.025-0.3 % ophthalmic solution Place 1 drop into both eyes 4 (four) times daily as needed for eye irritation.     Nutritional Supplements (ENSURE ORIGINAL) LIQD Take 2 Bottles by mouth daily.     potassium chloride (KLOR-CON) 10 MEQ tablet Take 4 tablets (40 mEq  total) by mouth 2 (two) times daily. Take extra 4 tablets (40 mEq total) with weekly Metolazone dose     spironolactone (ALDACTONE) 25 MG tablet Take 1 tablet (25 mg total) by mouth at bedtime. 90 tablet 3   torsemide (DEMADEX) 20 MG tablet Take 4 tablets (80 mg total) by mouth 2 (two) times daily.     metolazone (ZAROXOLYN) 2.5 MG tablet Take 1 tablet (2.5 mg total) by mouth once a week. Take every Wednesday with extra 40 mEq of Potassium (Patient not taking: Reported on 08/16/2023) 12 tablet 1   nitroGLYCERIN (NITROSTAT) 0.4 MG SL tablet Place 1 tablet (0.4 mg total) under the tongue every 5 (five) minutes as needed for chest pain. (Patient not taking: Reported on 08/16/2023) 90 tablet 5   No current facility-administered medications for this encounter.    Allergies:   Lactose intolerance (gi) and Codeine   Social History:  The patient  reports that she quit smoking about 9 years ago. Her smoking use included cigarettes. She started smoking about 29 years ago. She has a 20 pack-year smoking history. She has never used smokeless tobacco. She reports current alcohol use of about 5.0 standard drinks of alcohol per week. She reports current drug use. Drug: Marijuana.   Family History:  The patient's family history includes Cancer in her maternal grandmother; Diabetes in her paternal grandmother; Heart attack in her mother; Heart disease in her mother; Hypertension in her maternal grandmother and mother.   ROS:  Please see the history of present illness.   All other systems are personally reviewed and negative.    Recent Labs: 08/03/2023: Hemoglobin 15.6; Platelets 207; TSH 0.787 08/06/2023: Magnesium 2.3 08/16/2023: ALT 25; B Natriuretic Peptide 1,342.5; BUN 29; Creatinine, Ser 1.43; Potassium 3.3; Sodium 139  Personally reviewed   Wt Readings from Last 3 Encounters:  08/16/23 43.4 kg (95 lb 9.6 oz)  08/06/23 41.4 kg (91 lb 3.2 oz)  05/30/23 43.5 kg (95 lb 12.8 oz)   BP 100/74   Pulse 81   Ht 4'  11" (1.499 m)   Wt 43.4 kg (95 lb 9.6 oz)   BMI 19.31 kg/m   Physical Exam General:  Thin, African American female HEENT: normal Neck: supple. JVP 7-8. Cor: PMI nondisplaced. Regular rate & rhythm. No rubs,  gallops or murmurs. Lungs:  + crackles Abdomen: soft, nontender, nondistended.  Extremities: no cyanosis, clubbing, rash, edema Neuro: alert & orientedx3. Affect pleasant   Device interrogation (personally reviewed): Optivol trended down significantly last week of August, now starting to back up, thoracic impedance was above threshold now trending down, no VT/VF, 1.3 hrs activity/day  Assessment & Plan: 1. Chronic Systolic Heart Failure, due to ICM  - Echo 09/04/18 ([facility]) LVEF 20%, Moderate AI, Mild MR, Mild TR, Severe LAE, RV mildly decreased.  - Echo 02/12/19: EF 25-30%, RV mildly reduced - Echo 11/21 EF 20-25% RV mild HK. - Echo 2022 EF < 20% RV normal - Echo 1/23 EF 20-25% RV ok  - Has Medtronic single chamber ICD  - Echo 07/10/22: EF 20-25%, global hypokinesis, GIIDD, RV systolic function, mildly reduced. RV size normal. Mild MR, trivial TR, aortic valve w/ mild-mod regurg.  - Echo (5/24): EF 20-25%, LV with global HK, RV mildly reduced, moderate AI. - NYHA III, volume maybe slightly up on exam. OptiVol starting to trend up and thoracic impedance below threshold. She is taking Torsemide 80 mg q am/40 mg q pm (feels 80 BID is too much). Agrees to take an extra 40 mg Torsemide this evening with an extra 40 mEq of potassium. - Has metolazone to use as needed - Continue spiro 25 mg daily - Continue digoxin 0.125 mg daily.  - Continue Farxiga 10 mg daily.  - Continue Corlanor 7.5 mg bid. - Failed Entresto due to hyperkalemia.   - No b-blocker due to intolerance with low output and bad COPD with frequent flares. - Not a candidate for advanced therapies with her degree of lung disease.   - Now with Barostim in place.  - Labs today   2. Chronic Hypoxic Respiratory Failure with  COPD - Multifactorial, former smoker, h/o small cell lung cancer treated with chemo, chest XRT, COPD and HF - She has home O2 (Adapt) & BiPap. - Continue Trelegy and nebs - She is now followed by Pulmonary  3. CAD  - Hx of NSTEMI/STEMI s/p Emergent CABG 02/03/18: Cath with severe 2v CAD as above with severe LV dysfunction/ICM.  - s/p Emergent CABG 02/03/18.   - Had NSTEMI and now s/p LHC 02/12/19 with DES to the ostial ramus extending into the distal left main.  - No longer on brillinta or ASA due to bleeding, and need for [facility]. - No chest pain.  - Continue hi-intensity statin.   4. COPD w/ current exacerbation - Recent exacerbations with + influenza a.  - Continue Trelegy. - Followed by Pulmonary.   5. Recurrent pleural effusions s/p pleurodesis - Resolved.  - Recent CXR ok   6. PAF - CHA2DS2/VASc is at least 4. (CHF, Vasc disease, HTN, Female).   - She has history of Afib RVR in the past in the setting of her Lung CA.  - Previously on Xarelto but stopped due to hemoptysis.  - Started on Eliquis in 12/22 due to renal infarct. - No AF on device interrogation, as above - Continue Eliquis 2.5 mg bid (appropriate for weight and Scr has recently been > 1.5), will need to revaluate dosing if renal function improves. No bleeding issues.  7. 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 - Remains on Eliquis.     8. H/o SCLC:  - s/p treatment 2015. Lost to f/u since 04/2015.  - Chest CT 02/19/18 with mass-like consolidation in R hilum concerning for recurrent tumor.  -  Repeat CT 03/27/2018 -No definite findings of locally recurrent tumor in the right lung. 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  - S/p thoracentesis 04/2018. Cytology negative for malignancy.  - CTA 11/19 stable scarring in the right hilum consistent with emphysema. Bronchoscopy on 11/15, no obvious source of bleeding - CT chest (5/24)  soft tissue thickening along mediastinum but no discrete mass.  9. ? Kidney mass: - CT Chest (5/24) showed ill-defined area of enhancement of Lefty kidney, ? Infiltrative mass - She has CT abd/pelvis 07/24: changes consistent with recent renal infarct   Follow up: 6 weeks with APP  Nela Bascom N, PA-C  4:30 PM

## 2023-08-16 NOTE — Patient Instructions (Signed)
Take 2 extra torsemide (40 mg) this afternoon with extra 40 meq potassium. Labs today - will call you if abnormal. Please call [facility] to schedule your first appointment at: [phone]. Return to Heart Failure APP Clinic in 6 weeks - see below. Please call us at [phone] if any questions or concerns prior to your next visit.

## 2023-08-20 ENCOUNTER — Telehealth (HOSPITAL_COMMUNITY): Payer: Self-pay | Admitting: *Deleted

## 2023-08-20 DIAGNOSIS — I5022 Chronic systolic (congestive) heart failure: Secondary | ICD-10-CM

## 2023-08-20 DIAGNOSIS — I255 Ischemic cardiomyopathy: Secondary | ICD-10-CM

## 2023-08-20 NOTE — Telephone Encounter (Signed)
Called patient per Unice Cobble, PA with following lab results and instructions:  "Her potassium is low. She should start taking potassium 40 mEq BID every day. BMET 1 week."  Pt verbalized understanding of same. Repeat lab scheduled and ordered. Asked patient to call us at [phone] if any questions or concerns prior to next visit.

## 2023-08-22 ENCOUNTER — Ambulatory Visit: Payer: BC Managed Care – PPO | Attending: Cardiology

## 2023-08-22 ENCOUNTER — Inpatient Hospital Stay: Payer: BC Managed Care – PPO | Admitting: Family Medicine

## 2023-08-22 ENCOUNTER — Telehealth: Payer: Self-pay

## 2023-08-22 DIAGNOSIS — Z95810 Presence of automatic (implantable) cardiac defibrillator: Secondary | ICD-10-CM

## 2023-08-22 DIAGNOSIS — I5022 Chronic systolic (congestive) heart failure: Secondary | ICD-10-CM

## 2023-08-22 NOTE — Telephone Encounter (Signed)
Returned call to patient as requested by voice mail message.  See ICM note

## 2023-08-22 NOTE — Progress Notes (Signed)
EPIC Encounter for ICM Monitoring  Patient Name: Brittany [name] is a 62 y.o. female Date: 08/22/2023 Primary Care Physican: Georganna Skeans, MD Primary Cardiologist: Bensimhon Electrophysiologist: Lalla Brothers 02/13/2023 Weight: 100-102 lbs 03/20/2023 Weight: 100 lbs 05/22/2023 Weight: 96 lbs 06/26/2023 Weight: 96-98 lbs 08/22/2023 Weight: 93 lbs   Received call from patient.  She reports severe SOB last night, sweaty, clammy, very nauseated, diarrhea, and unable to walk to the door.  She did taking breathing treatments last night and started feeling a little better but was a struggle.  She is using a struggle.     Optivol Thoracic impedance suggesting possible fluid accumulation starting 9/4.      Prescribed: Torsemide 20 mg take 4 tablets (80 mg total) by mouth twice a day.  9/11 She reports taking 80 mg every AM and 40 mg PM.   Potassium 10 mEq take 4 tablets (40 mEq total) by mouth twice a day.  Take extra 4 tablets (40 mEq total) with weekly Metolazone.   Spironolactone 25 mg take 1 tablet (25 mg total) by mouth at bedtime Metolazone 2.5 mg take 1 tablet by mouth every week on Wednesday.    Labs: 08/28/2023 BMET scheduled at HF clinic 08/16/2023 Creatinine 1.43, BUN 29, Potassium 3.3, Sodium 139, GFR 41 (potassium addressed per lab note) 08/06/2023 Creatinine 1.90, BUN 75, Potassium 4.0, Sodium 131, GFR 29  08/05/2023 Creatinine 1.47, BUN 51, Potassium 4.5, Sodium 133, GFR 40  08/04/2023 Creatinine 1.45, BUN 40, Potassium 4.4, Sodium 138, GFR 41  08/03/2023 Creatinine 1.20, BUN 36, Potassium 3.6, Sodium 141 05/28/2023 Creatinine 1.27, BUN 29, Potassium 3.5, Sodium 137, GFR 48 A complete set of results can be found in Results Review.   Recommendations: She took Metolazone with Potassium today. Reiterated she needs to take Potassium 40 mEq twice a day and the extra 40 mEq with every Metolazone dose.   She feels exhausted and weak today.     Follow-up plan: ICM clinic phone  appointment on 08/27/2023 to recheck fluid levels.   91 day device clinic remote transmission 09/19/2023.     EP/Cardiology Office Visits:  09/19/2023 with HF clinic.   10/10/2023 with Dr Lalla Brothers.   Copy of ICM check sent to Dr. Lalla Brothers.  Copy sent to Anna Genre, PA as Lorain Childes.  3 month ICM trend: 08/22/2023.    12-14 Month ICM trend:     Karie Soda, RN 08/22/2023 3:01 PM

## 2023-08-22 NOTE — Progress Notes (Signed)
Spoke with patient and advised Anna Genre PA did not change any meds and wants a report checked on 9/13 instead of 9/16.  Advised to call 911 if condition worsens.

## 2023-08-22 NOTE — Progress Notes (Signed)
  Received: Today Andrey Farmer, PA-C  Penney Domanski, Josephine Igo, RN If she took the metolazone today lets recheck her device on Friday to see if her fluid is starting to trend down. Thank you!

## 2023-08-24 ENCOUNTER — Ambulatory Visit: Payer: BC Managed Care – PPO | Attending: Cardiology

## 2023-08-24 DIAGNOSIS — I5022 Chronic systolic (congestive) heart failure: Secondary | ICD-10-CM

## 2023-08-24 DIAGNOSIS — Z95810 Presence of automatic (implantable) cardiac defibrillator: Secondary | ICD-10-CM

## 2023-08-24 NOTE — Progress Notes (Signed)
EPIC Encounter for ICM Monitoring  Patient Name: Brittany [name] is a 62 y.o. female Date: 08/24/2023 Primary Care Physican: Georganna Skeans, MD Primary Cardiologist: Bensimhon Electrophysiologist: Lalla Brothers 02/13/2023 Weight: 100-102 lbs 03/20/2023 Weight: 100 lbs 05/22/2023 Weight: 96 lbs 06/26/2023 Weight: 96-98 lbs 08/22/2023 Weight: 93 lbs   Spoke with patient and heart failure questions reviewed.  Transmission results reviewed.  Pt reports breathing is better since taking Metolazone 9/11.     Optivol Thoracic impedance suggesting fluid levels returned to normal after taking Metolazone on 9/11.      Prescribed: Torsemide 20 mg take 4 tablets (80 mg total) by mouth twice a day.  08/22/23 She reports taking 80 mg every AM and 40 mg PM.   Potassium 10 mEq take 4 tablets (40 mEq total) by mouth twice a day.  Take extra 4 tablets (40 mEq total) with weekly Metolazone.   Spironolactone 25 mg take 1 tablet (25 mg total) by mouth at bedtime Metolazone 2.5 mg take 1 tablet by mouth every week on Wednesday.    Labs: 08/28/2023 BMET scheduled at HF clinic 08/16/2023 Creatinine 1.43, BUN 29, Potassium 3.3, Sodium 139, GFR 41 (potassium addressed per lab note) 08/06/2023 Creatinine 1.90, BUN 75, Potassium 4.0, Sodium 131, GFR 29  08/05/2023 Creatinine 1.47, BUN 51, Potassium 4.5, Sodium 133, GFR 40  08/04/2023 Creatinine 1.45, BUN 40, Potassium 4.4, Sodium 138, GFR 41  08/03/2023 Creatinine 1.20, BUN 36, Potassium 3.6, Sodium 141 05/28/2023 Creatinine 1.27, BUN 29, Potassium 3.5, Sodium 137, GFR 48 A complete set of results can be found in Results Review.   Recommendations: No changes and encouraged to call if experiencing any fluid symptoms.     Follow-up plan: ICM clinic phone appointment on 09/04/2023 to recheck fluid levels.   91 day device clinic remote transmission 09/19/2023.     EP/Cardiology Office Visits:  09/19/2023 with HF clinic.   10/10/2023 with Dr Lalla Brothers.   Copy of ICM check  sent to Dr. Lalla Brothers.  Copy sent to Anna Genre, PA as Lorain Childes.  3 month ICM trend: 08/24/2023.    12-14 Month ICM trend:     Karie Soda, RN 08/24/2023 7:27 AM

## 2023-08-28 ENCOUNTER — Other Ambulatory Visit (HOSPITAL_COMMUNITY): Payer: BC Managed Care – PPO

## 2023-08-29 ENCOUNTER — Inpatient Hospital Stay: Payer: BC Managed Care – PPO | Admitting: Family Medicine

## 2023-09-04 ENCOUNTER — Ambulatory Visit: Payer: BC Managed Care – PPO | Attending: Cardiology

## 2023-09-04 DIAGNOSIS — I5022 Chronic systolic (congestive) heart failure: Secondary | ICD-10-CM

## 2023-09-04 DIAGNOSIS — Z95810 Presence of automatic (implantable) cardiac defibrillator: Secondary | ICD-10-CM

## 2023-09-04 NOTE — Progress Notes (Unsigned)
EPIC Encounter for ICM Monitoring  Patient Name: Brittany [name] is a 62 y.o. female Date: 09/04/2023 Primary Care Physican: Georganna Skeans, MD Primary Cardiologist: Bensimhon Electrophysiologist: Lalla Brothers 02/13/2023 Weight: 100-102 lbs 03/20/2023 Weight: 100 lbs 05/22/2023 Weight: 96 lbs 06/26/2023 Weight: 96-98 lbs 08/22/2023 Weight: 93 lbs   Attempted call to patient and unable to reach.  Transmission reviewed.      Optivol Thoracic impedance suggesting possible fluid accumulation returned on 9/16.   Fluid levels had returned to normal after taking Metolazone on 9/11.     Prescribed: Torsemide 20 mg take 4 tablets (80 mg total) by mouth twice a day.  08/22/23 She reports taking 80 mg every AM and 40 mg PM.   Potassium 10 mEq take 4 tablets (40 mEq total) by mouth twice a day.  Take extra 4 tablets (40 mEq total) with weekly Metolazone.   Spironolactone 25 mg take 1 tablet (25 mg total) by mouth at bedtime Metolazone 2.5 mg take 1 tablet by mouth every week on Wednesday.    Labs: 08/28/2023 Missed appt for BMET scheduled at HF clinic.  Was ordered on 9/9 for follow up on potassium levels 08/16/2023 Creatinine 1.43, BUN 29, Potassium 3.3, Sodium 139, GFR 41 (potassium addressed per lab note) 08/06/2023 Creatinine 1.90, BUN 75, Potassium 4.0, Sodium 131, GFR 29  08/05/2023 Creatinine 1.47, BUN 51, Potassium 4.5, Sodium 133, GFR 40  08/04/2023 Creatinine 1.45, BUN 40, Potassium 4.4, Sodium 138, GFR 41  08/03/2023 Creatinine 1.20, BUN 36, Potassium 3.6, Sodium 141 05/28/2023 Creatinine 1.27, BUN 29, Potassium 3.5, Sodium 137, GFR 48 A complete set of results can be found in Results Review.   Recommendations: Unable to reach.     Follow-up plan: ICM clinic phone appointment on 09/17/2023 for 31 day and to recheck fluid levels.   91 day device clinic remote transmission 09/19/2023.     EP/Cardiology Office Visits:  09/27/2023 with HF clinic.   10/10/2023 with Dr Lalla Brothers.   Copy of ICM  check sent to Dr. Lalla Brothers.  Will send to HF clinic for review if patient is reached.   3 month ICM trend: 09/04/2023.    12-14 Month ICM trend:   ***  Karie Soda, RN 09/04/2023 12:59 PM

## 2023-09-05 NOTE — Progress Notes (Signed)
Spoke with patient and heart failure questions reviewed.  Transmission results reviewed.  Pt reports she was very SOB on Monday and took Metolazone on 9/23 and feels like the fluid accumulation has resolved.  Breathing is back to baseline.   09/05/2023 Optivol thoracic impedance has returned to normal after taking Metolazone on 9/23.

## 2023-09-11 ENCOUNTER — Ambulatory Visit (HOSPITAL_COMMUNITY)
Admission: RE | Admit: 2023-09-11 | Discharge: 2023-09-11 | Disposition: A | Discharge status: Home / Self Care | Payer: BC Managed Care – PPO | Source: Ambulatory Visit | Attending: Internal Medicine | Admitting: Internal Medicine

## 2023-09-11 DIAGNOSIS — I255 Ischemic cardiomyopathy: Secondary | ICD-10-CM | POA: Diagnosis not present

## 2023-09-11 DIAGNOSIS — I5022 Chronic systolic (congestive) heart failure: Secondary | ICD-10-CM | POA: Diagnosis present

## 2023-09-11 LAB — BASIC METABOLIC PANEL
Anion gap: 15 (ref 5–15)
BUN: 37 mg/dL — ABNORMAL HIGH (ref 8–23)
CO2: 29 mmol/L (ref 22–32)
Calcium: 9.6 mg/dL (ref 8.9–10.3)
Chloride: 94 mmol/L — ABNORMAL LOW (ref 98–111)
Creatinine, Ser: 1.69 mg/dL — ABNORMAL HIGH (ref 0.44–1.00)
GFR, Estimated: 34 mL/min — ABNORMAL LOW (ref >60)
Glucose, Bld: 153 mg/dL — ABNORMAL HIGH (ref 70–99)
Potassium: 3.5 mmol/L (ref 3.5–5.1)
Sodium: 138 mmol/L (ref 135–145)

## 2023-09-17 ENCOUNTER — Ambulatory Visit: Payer: BC Managed Care – PPO | Attending: Cardiology

## 2023-09-17 DIAGNOSIS — Z95810 Presence of automatic (implantable) cardiac defibrillator: Secondary | ICD-10-CM

## 2023-09-17 DIAGNOSIS — I5022 Chronic systolic (congestive) heart failure: Secondary | ICD-10-CM | POA: Diagnosis not present

## 2023-09-18 NOTE — Progress Notes (Signed)
EPIC Encounter for ICM Monitoring  Patient Name: Brittany [name] is a 62 y.o. female Date: 09/18/2023 Primary Care Physican: Georganna Skeans, MD Primary Cardiologist: Bensimhon Electrophysiologist: Lalla Brothers 02/13/2023 Weight: 100-102 lbs 03/20/2023 Weight: 100 lbs 05/22/2023 Weight: 96 lbs 06/26/2023 Weight: 96-98 lbs 08/22/2023 Weight: 93 lbs  VT-NS (>4 beats, >200 bpm)  1   Spoke with patient and heart failure questions reviewed.  Transmission results reviewed.  Pt asymptomatic for fluid accumulation.  Reports feeling well at this time and voices no complaints.  She has been out of dinner for her birthday about 4 times.     Optivol Thoracic impedance suggesting possible fluid accumulation starting 10/3.     Prescribed: Torsemide 20 mg take 4 tablets (80 mg total) by mouth twice a day.  08/22/23 She reports taking 80 mg every AM and 40 mg PM.   Potassium 10 mEq take 4 tablets (40 mEq total) by mouth twice a day.  Take extra 4 tablets (40 mEq total) with weekly Metolazone.   Spironolactone 25 mg take 1 tablet (25 mg total) by mouth at bedtime Metolazone 2.5 mg take 1 tablet by mouth every week on Wednesday.    Labs: 09/11/2023 Creatinine 1.69, BUN 37, Potassium 3.5, Sodium 138, GFR 34 08/16/2023 Creatinine 1.43, BUN 29, Potassium 3.3, Sodium 139, GFR 41 (potassium addressed per lab note) 08/06/2023 Creatinine 1.90, BUN 75, Potassium 4.0, Sodium 131, GFR 29  08/05/2023 Creatinine 1.47, BUN 51, Potassium 4.5, Sodium 133, GFR 40  08/04/2023 Creatinine 1.45, BUN 40, Potassium 4.4, Sodium 138, GFR 41  08/03/2023 Creatinine 1.20, BUN 36, Potassium 3.6, Sodium 141 05/28/2023 Creatinine 1.27, BUN 29, Potassium 3.5, Sodium 137, GFR 48 A complete set of results can be found in Results Review.   Recommendations:  She will take prescribed Torsemide dosage of 80 mg bid for a few days to help resolve with fluid.     Follow-up plan: ICM clinic phone appointment on 09/25/2023 to recheck fluid  levels.   91 day device clinic remote transmission 09/19/2023.     EP/Cardiology Office Visits:  09/27/2023 with HF clinic.   10/10/2023 with Dr Lalla Brothers.   Copy of ICM check sent to Dr. Lalla Brothers.   3 month ICM trend: 09/17/2023.    12-14 Month ICM trend:     Karie Soda, RN 09/18/2023 1:23 PM

## 2023-09-19 ENCOUNTER — Ambulatory Visit (INDEPENDENT_AMBULATORY_CARE_PROVIDER_SITE_OTHER): Payer: BC Managed Care – PPO

## 2023-09-19 DIAGNOSIS — I255 Ischemic cardiomyopathy: Secondary | ICD-10-CM

## 2023-09-19 LAB — CUP PACEART REMOTE DEVICE CHECK
Battery Remaining Longevity: 86 mo
Battery Voltage: 2.99 V
Brady Statistic RV Percent Paced: 0 %
Date Time Interrogation Session: 20241009012504
HighPow Impedance: 68 Ohm
Implantable Lead Connection Status: 753985
Implantable Lead Implant Date: 20190905
Implantable Lead Location: 753860
Implantable Pulse Generator Implant Date: 20190905
Lead Channel Impedance Value: 342 Ohm
Lead Channel Impedance Value: 437 Ohm
Lead Channel Pacing Threshold Amplitude: 0.75 V
Lead Channel Pacing Threshold Pulse Width: 0.4 ms
Lead Channel Sensing Intrinsic Amplitude: 21.375 mV
Lead Channel Sensing Intrinsic Amplitude: 21.375 mV
Lead Channel Setting Pacing Amplitude: 2 V
Lead Channel Setting Pacing Pulse Width: 0.4 ms
Lead Channel Setting Sensing Sensitivity: 0.3 mV
Zone Setting Status: 755011
Zone Setting Status: 755011

## 2023-09-25 ENCOUNTER — Other Ambulatory Visit: Payer: Self-pay

## 2023-09-25 ENCOUNTER — Inpatient Hospital Stay (HOSPITAL_COMMUNITY)
Admission: EM | Admit: 2023-09-25 | Discharge: 2023-09-29 | DRG: 291 | Disposition: A | Discharge status: Home / Self Care | Payer: BC Managed Care – PPO | Attending: Internal Medicine | Admitting: Internal Medicine

## 2023-09-25 ENCOUNTER — Emergency Department (HOSPITAL_COMMUNITY): Payer: BC Managed Care – PPO

## 2023-09-25 ENCOUNTER — Encounter (HOSPITAL_COMMUNITY): Payer: Self-pay | Admitting: Internal Medicine

## 2023-09-25 DIAGNOSIS — I459 Conduction disorder, unspecified: Secondary | ICD-10-CM | POA: Diagnosis present

## 2023-09-25 DIAGNOSIS — R0603 Acute respiratory distress: Secondary | ICD-10-CM

## 2023-09-25 DIAGNOSIS — Z91011 Allergy to milk products: Secondary | ICD-10-CM

## 2023-09-25 DIAGNOSIS — I5023 Acute on chronic systolic (congestive) heart failure: Secondary | ICD-10-CM | POA: Diagnosis present

## 2023-09-25 DIAGNOSIS — I10 Essential (primary) hypertension: Secondary | ICD-10-CM | POA: Diagnosis not present

## 2023-09-25 DIAGNOSIS — E785 Hyperlipidemia, unspecified: Secondary | ICD-10-CM | POA: Diagnosis present

## 2023-09-25 DIAGNOSIS — E871 Hypo-osmolality and hyponatremia: Secondary | ICD-10-CM | POA: Diagnosis not present

## 2023-09-25 DIAGNOSIS — Z951 Presence of aortocoronary bypass graft: Secondary | ICD-10-CM

## 2023-09-25 DIAGNOSIS — J441 Chronic obstructive pulmonary disease with (acute) exacerbation: Principal | ICD-10-CM | POA: Diagnosis present

## 2023-09-25 DIAGNOSIS — Z1152 Encounter for screening for COVID-19: Secondary | ICD-10-CM

## 2023-09-25 DIAGNOSIS — I480 Paroxysmal atrial fibrillation: Secondary | ICD-10-CM | POA: Diagnosis present

## 2023-09-25 DIAGNOSIS — Z885 Allergy status to narcotic agent status: Secondary | ICD-10-CM

## 2023-09-25 DIAGNOSIS — Z66 Do not resuscitate: Secondary | ICD-10-CM | POA: Diagnosis present

## 2023-09-25 DIAGNOSIS — J9601 Acute respiratory failure with hypoxia: Secondary | ICD-10-CM | POA: Diagnosis present

## 2023-09-25 DIAGNOSIS — Z7984 Long term (current) use of oral hypoglycemic drugs: Secondary | ICD-10-CM

## 2023-09-25 DIAGNOSIS — Z85118 Personal history of other malignant neoplasm of bronchus and lung: Secondary | ICD-10-CM

## 2023-09-25 DIAGNOSIS — J9621 Acute and chronic respiratory failure with hypoxia: Secondary | ICD-10-CM

## 2023-09-25 DIAGNOSIS — E876 Hypokalemia: Secondary | ICD-10-CM | POA: Diagnosis not present

## 2023-09-25 DIAGNOSIS — I2510 Atherosclerotic heart disease of native coronary artery without angina pectoris: Secondary | ICD-10-CM | POA: Diagnosis present

## 2023-09-25 DIAGNOSIS — N179 Acute kidney failure, unspecified: Secondary | ICD-10-CM | POA: Diagnosis not present

## 2023-09-25 DIAGNOSIS — Z4502 Encounter for adjustment and management of automatic implantable cardiac defibrillator: Secondary | ICD-10-CM

## 2023-09-25 DIAGNOSIS — I5021 Acute systolic (congestive) heart failure: Secondary | ICD-10-CM | POA: Diagnosis not present

## 2023-09-25 DIAGNOSIS — N1832 Chronic kidney disease, stage 3b: Secondary | ICD-10-CM | POA: Diagnosis present

## 2023-09-25 DIAGNOSIS — I130 Hypertensive heart and chronic kidney disease with heart failure and stage 1 through stage 4 chronic kidney disease, or unspecified chronic kidney disease: Secondary | ICD-10-CM | POA: Diagnosis present

## 2023-09-25 DIAGNOSIS — C3490 Malignant neoplasm of unspecified part of unspecified bronchus or lung: Secondary | ICD-10-CM | POA: Diagnosis not present

## 2023-09-25 DIAGNOSIS — I351 Nonrheumatic aortic (valve) insufficiency: Secondary | ICD-10-CM | POA: Diagnosis present

## 2023-09-25 DIAGNOSIS — J439 Emphysema, unspecified: Secondary | ICD-10-CM | POA: Diagnosis present

## 2023-09-25 DIAGNOSIS — Z87891 Personal history of nicotine dependence: Secondary | ICD-10-CM

## 2023-09-25 DIAGNOSIS — E1165 Type 2 diabetes mellitus with hyperglycemia: Secondary | ICD-10-CM | POA: Diagnosis present

## 2023-09-25 DIAGNOSIS — Z833 Family history of diabetes mellitus: Secondary | ICD-10-CM

## 2023-09-25 DIAGNOSIS — Z681 Body mass index (BMI) 19 or less, adult: Secondary | ICD-10-CM | POA: Diagnosis not present

## 2023-09-25 DIAGNOSIS — Z95810 Presence of automatic (implantable) cardiac defibrillator: Secondary | ICD-10-CM

## 2023-09-25 DIAGNOSIS — T380X5A Adverse effect of glucocorticoids and synthetic analogues, initial encounter: Secondary | ICD-10-CM | POA: Diagnosis present

## 2023-09-25 DIAGNOSIS — Z79899 Other long term (current) drug therapy: Secondary | ICD-10-CM

## 2023-09-25 DIAGNOSIS — E1122 Type 2 diabetes mellitus with diabetic chronic kidney disease: Secondary | ICD-10-CM | POA: Diagnosis present

## 2023-09-25 DIAGNOSIS — E8729 Other acidosis: Secondary | ICD-10-CM | POA: Diagnosis present

## 2023-09-25 DIAGNOSIS — J9602 Acute respiratory failure with hypercapnia: Secondary | ICD-10-CM | POA: Diagnosis present

## 2023-09-25 DIAGNOSIS — Z5986 Financial insecurity: Secondary | ICD-10-CM

## 2023-09-25 DIAGNOSIS — Z923 Personal history of irradiation: Secondary | ICD-10-CM

## 2023-09-25 DIAGNOSIS — E119 Type 2 diabetes mellitus without complications: Secondary | ICD-10-CM | POA: Diagnosis present

## 2023-09-25 DIAGNOSIS — Z7951 Long term (current) use of inhaled steroids: Secondary | ICD-10-CM

## 2023-09-25 DIAGNOSIS — E1169 Type 2 diabetes mellitus with other specified complication: Secondary | ICD-10-CM | POA: Diagnosis present

## 2023-09-25 DIAGNOSIS — I509 Heart failure, unspecified: Secondary | ICD-10-CM

## 2023-09-25 DIAGNOSIS — N183 Chronic kidney disease, stage 3 unspecified: Secondary | ICD-10-CM | POA: Diagnosis present

## 2023-09-25 DIAGNOSIS — Z7901 Long term (current) use of anticoagulants: Secondary | ICD-10-CM

## 2023-09-25 DIAGNOSIS — D72829 Elevated white blood cell count, unspecified: Secondary | ICD-10-CM | POA: Diagnosis present

## 2023-09-25 DIAGNOSIS — R9431 Abnormal electrocardiogram [ECG] [EKG]: Secondary | ICD-10-CM | POA: Diagnosis not present

## 2023-09-25 DIAGNOSIS — Z8249 Family history of ischemic heart disease and other diseases of the circulatory system: Secondary | ICD-10-CM

## 2023-09-25 DIAGNOSIS — E43 Unspecified severe protein-calorie malnutrition: Secondary | ICD-10-CM | POA: Diagnosis present

## 2023-09-25 LAB — COMPREHENSIVE METABOLIC PANEL
ALT: 21 U/L (ref 0–44)
AST: 40 U/L (ref 15–41)
Albumin: 4 g/dL (ref 3.5–5.0)
Alkaline Phosphatase: 90 U/L (ref 38–126)
Anion gap: 18 — ABNORMAL HIGH (ref 5–15)
BUN: 22 mg/dL (ref 8–23)
CO2: 24 mmol/L (ref 22–32)
Calcium: 9.4 mg/dL (ref 8.9–10.3)
Chloride: 98 mmol/L (ref 98–111)
Creatinine, Ser: 1.66 mg/dL — ABNORMAL HIGH (ref 0.44–1.00)
GFR, Estimated: 34 mL/min — ABNORMAL LOW (ref >60)
Glucose, Bld: 379 mg/dL — ABNORMAL HIGH (ref 70–99)
Potassium: 3.5 mmol/L (ref 3.5–5.1)
Sodium: 140 mmol/L (ref 135–145)
Total Bilirubin: 1.1 mg/dL (ref 0.3–1.2)
Total Protein: 7.5 g/dL (ref 6.5–8.1)

## 2023-09-25 LAB — I-STAT ARTERIAL BLOOD GAS, ED
Acid-Base Excess: 0 mmol/L (ref 0.0–2.0)
Bicarbonate: 30.4 mmol/L — ABNORMAL HIGH (ref 20.0–28.0)
Calcium, Ion: 1.18 mmol/L (ref 1.15–1.40)
HCT: 46 % (ref 36.0–46.0)
Hemoglobin: 15.6 g/dL — ABNORMAL HIGH (ref 12.0–15.0)
O2 Saturation: 100 %
Patient temperature: 96.3
Potassium: 3.4 mmol/L — ABNORMAL LOW (ref 3.5–5.1)
Sodium: 137 mmol/L (ref 135–145)
TCO2: 33 mmol/L — ABNORMAL HIGH (ref 22–32)
pCO2 arterial: 73.3 mm[Hg] (ref 32–48)
pH, Arterial: 7.219 — ABNORMAL LOW (ref 7.35–7.45)
pO2, Arterial: 279 mm[Hg] — ABNORMAL HIGH (ref 83–108)

## 2023-09-25 LAB — I-STAT VENOUS BLOOD GAS, ED
Acid-Base Excess: 3 mmol/L — ABNORMAL HIGH (ref 0.0–2.0)
Bicarbonate: 30.3 mmol/L — ABNORMAL HIGH (ref 20.0–28.0)
Calcium, Ion: 1.14 mmol/L — ABNORMAL LOW (ref 1.15–1.40)
HCT: 48 % — ABNORMAL HIGH (ref 36.0–46.0)
Hemoglobin: 16.3 g/dL — ABNORMAL HIGH (ref 12.0–15.0)
O2 Saturation: 81 %
Patient temperature: 96.3
Potassium: 2.7 mmol/L — CL (ref 3.5–5.1)
Sodium: 138 mmol/L (ref 135–145)
TCO2: 32 mmol/L (ref 22–32)
pCO2, Ven: 51.4 mm[Hg] (ref 44–60)
pH, Ven: 7.372 (ref 7.25–7.43)
pO2, Ven: 44 mm[Hg] (ref 32–45)

## 2023-09-25 LAB — CBC WITH DIFFERENTIAL/PLATELET
Abs Immature Granulocytes: 0.12 10*3/uL — ABNORMAL HIGH (ref 0.00–0.07)
Basophils Absolute: 0.1 10*3/uL (ref 0.0–0.1)
Basophils Relative: 1 %
Eosinophils Absolute: 0.1 10*3/uL (ref 0.0–0.5)
Eosinophils Relative: 1 %
HCT: 47.1 % — ABNORMAL HIGH (ref 36.0–46.0)
Hemoglobin: 14.5 g/dL (ref 12.0–15.0)
Immature Granulocytes: 1 %
Lymphocytes Relative: 16 %
Lymphs Abs: 2 10*3/uL (ref 0.7–4.0)
MCH: 28.5 pg (ref 26.0–34.0)
MCHC: 30.8 g/dL (ref 30.0–36.0)
MCV: 92.7 fL (ref 80.0–100.0)
Monocytes Absolute: 1 10*3/uL (ref 0.1–1.0)
Monocytes Relative: 8 %
Neutro Abs: 9.5 10*3/uL — ABNORMAL HIGH (ref 1.7–7.7)
Neutrophils Relative %: 73 %
Platelets: 208 10*3/uL (ref 150–400)
RBC: 5.08 MIL/uL (ref 3.87–5.11)
RDW: 19.4 % — ABNORMAL HIGH (ref 11.5–15.5)
WBC: 12.8 10*3/uL — ABNORMAL HIGH (ref 4.0–10.5)
nRBC: 0.2 % (ref 0.0–0.2)

## 2023-09-25 LAB — GLUCOSE, CAPILLARY
Glucose-Capillary: 202 mg/dL — ABNORMAL HIGH (ref 70–99)
Glucose-Capillary: 195 mg/dL — ABNORMAL HIGH (ref 70–99)

## 2023-09-25 LAB — RESP PANEL BY RT-PCR (RSV, FLU A&B, COVID)  RVPGX2
Influenza A by PCR: NEGATIVE
Influenza B by PCR: NEGATIVE
Resp Syncytial Virus by PCR: NEGATIVE
SARS Coronavirus 2 by RT PCR: NEGATIVE

## 2023-09-25 LAB — I-STAT CHEM 8, ED
BUN: 24 mg/dL — ABNORMAL HIGH (ref 8–23)
Calcium, Ion: 1.12 mmol/L — ABNORMAL LOW (ref 1.15–1.40)
Chloride: 101 mmol/L (ref 98–111)
Creatinine, Ser: 1.6 mg/dL — ABNORMAL HIGH (ref 0.44–1.00)
Glucose, Bld: 380 mg/dL — ABNORMAL HIGH (ref 70–99)
HCT: 50 % — ABNORMAL HIGH (ref 36.0–46.0)
Hemoglobin: 17 g/dL — ABNORMAL HIGH (ref 12.0–15.0)
Potassium: 3.3 mmol/L — ABNORMAL LOW (ref 3.5–5.1)
Sodium: 140 mmol/L (ref 135–145)
TCO2: 26 mmol/L (ref 22–32)

## 2023-09-25 LAB — MAGNESIUM: Magnesium: 4.5 mg/dL — ABNORMAL HIGH (ref 1.7–2.4)

## 2023-09-25 LAB — DIGOXIN LEVEL: Digoxin Level: <0.2 ng/mL — ABNORMAL LOW (ref 0.8–2.0)

## 2023-09-25 LAB — CBG MONITORING, ED
Glucose-Capillary: 359 mg/dL — ABNORMAL HIGH (ref 70–99)
Glucose-Capillary: 134 mg/dL — ABNORMAL HIGH (ref 70–99)
Glucose-Capillary: 205 mg/dL — ABNORMAL HIGH (ref 70–99)

## 2023-09-25 LAB — BRAIN NATRIURETIC PEPTIDE: B Natriuretic Peptide: 4334.8 pg/mL — ABNORMAL HIGH (ref 0.0–100.0)

## 2023-09-25 LAB — TROPONIN I (HIGH SENSITIVITY)
Troponin I (High Sensitivity): 65 ng/L — ABNORMAL HIGH (ref <18)
Troponin I (High Sensitivity): 114 ng/L (ref <18)

## 2023-09-25 MED ORDER — INSULIN ASPART 100 UNIT/ML IJ SOLN
0.0000 [IU] | Freq: Three times a day (TID) | INTRAMUSCULAR | Status: DC
  Administered 2023-09-25 (×2): 3 [IU] via SUBCUTANEOUS
  Administered 2023-09-26: 1 [IU] via SUBCUTANEOUS
  Administered 2023-09-26 (×2): 2 [IU] via SUBCUTANEOUS
  Administered 2023-09-27 (×2): 5 [IU] via SUBCUTANEOUS

## 2023-09-25 MED ORDER — SPIRONOLACTONE 12.5 MG HALF TABLET
12.5000 mg | ORAL_TABLET | Freq: Every day | ORAL | Status: DC
  Administered 2023-09-25 – 2023-09-29 (×5): 12.5 mg via ORAL
  Filled 2023-09-25 (×5): qty 1

## 2023-09-25 MED ORDER — ENSURE ENLIVE PO LIQD
237.0000 mL | Freq: Two times a day (BID) | ORAL | Status: DC
  Administered 2023-09-25 – 2023-09-29 (×9): 237 mL via ORAL
  Filled 2023-09-25: qty 237

## 2023-09-25 MED ORDER — NITROGLYCERIN IN D5W 200-5 MCG/ML-% IV SOLN: 0.0000 ug/min | INTRAVENOUS | Status: DC

## 2023-09-25 MED ORDER — INSULIN ASPART 100 UNIT/ML IJ SOLN
7.0000 [IU] | Freq: Once | INTRAMUSCULAR | Status: AC
  Administered 2023-09-25: 7 [IU] via SUBCUTANEOUS

## 2023-09-25 MED ORDER — ATORVASTATIN CALCIUM 80 MG PO TABS
80.0000 mg | ORAL_TABLET | Freq: Every day | ORAL | Status: DC
  Administered 2023-09-25 – 2023-09-29 (×5): 80 mg via ORAL
  Filled 2023-09-25 (×5): qty 1

## 2023-09-25 MED ORDER — ACETAMINOPHEN 325 MG PO TABS
650.0000 mg | ORAL_TABLET | Freq: Four times a day (QID) | ORAL | Status: DC | PRN
  Filled 2023-09-25: qty 2

## 2023-09-25 MED ORDER — FUROSEMIDE 10 MG/ML IJ SOLN
80.0000 mg | Freq: Two times a day (BID) | INTRAMUSCULAR | Status: DC
  Administered 2023-09-25 – 2023-09-29 (×8): 80 mg via INTRAVENOUS
  Filled 2023-09-25 (×8): qty 8

## 2023-09-25 MED ORDER — HYDRALAZINE HCL 20 MG/ML IJ SOLN: 10.0000 mg | Freq: Four times a day (QID) | INTRAMUSCULAR | Status: DC | PRN

## 2023-09-25 MED ORDER — EPINEPHRINE 0.3 MG/0.3ML IJ SOAJ: 0.3000 mg | Freq: Once | INTRAMUSCULAR | Status: DC

## 2023-09-25 MED ORDER — ALBUTEROL SULFATE (2.5 MG/3ML) 0.083% IN NEBU
10.0000 mg/h | INHALATION_SOLUTION | Freq: Once | RESPIRATORY_TRACT | Status: AC
  Administered 2023-09-25: 10 mg/h via RESPIRATORY_TRACT
  Filled 2023-09-25: qty 3

## 2023-09-25 MED ORDER — ACETAMINOPHEN 650 MG RE SUPP: 650.0000 mg | Freq: Four times a day (QID) | RECTAL | Status: DC | PRN

## 2023-09-25 MED ORDER — POTASSIUM CHLORIDE CRYS ER 20 MEQ PO TBCR
40.0000 meq | EXTENDED_RELEASE_TABLET | Freq: Once | ORAL | Status: AC
  Administered 2023-09-25: 40 meq via ORAL
  Filled 2023-09-25: qty 2

## 2023-09-25 MED ORDER — DIGOXIN 125 MCG PO TABS
125.0000 ug | ORAL_TABLET | Freq: Every day | ORAL | Status: DC
  Administered 2023-09-25 – 2023-09-29 (×5): 125 ug via ORAL
  Filled 2023-09-25 (×5): qty 1

## 2023-09-25 MED ORDER — NITROGLYCERIN 2 % TD OINT: 1.0000 [in_us] | TOPICAL_OINTMENT | Freq: Once | TRANSDERMAL | Status: DC

## 2023-09-25 MED ORDER — FUROSEMIDE 10 MG/ML IJ SOLN
40.0000 mg | Freq: Once | INTRAMUSCULAR | Status: AC
  Administered 2023-09-25: 40 mg via INTRAVENOUS
  Filled 2023-09-25: qty 4

## 2023-09-25 MED ORDER — ALBUTEROL SULFATE (2.5 MG/3ML) 0.083% IN NEBU: 2.5000 mg | INHALATION_SOLUTION | Freq: Four times a day (QID) | RESPIRATORY_TRACT | Status: DC | PRN

## 2023-09-25 MED ORDER — INSULIN ASPART 100 UNIT/ML IJ SOLN
0.0000 [IU] | Freq: Every day | INTRAMUSCULAR | Status: DC
  Administered 2023-09-26: 5 [IU] via SUBCUTANEOUS

## 2023-09-25 MED ORDER — ONDANSETRON HCL 4 MG/2ML IJ SOLN
4.0000 mg | Freq: Once | INTRAMUSCULAR | Status: AC
  Administered 2023-09-25: 4 mg via INTRAVENOUS

## 2023-09-25 MED ORDER — APIXABAN 2.5 MG PO TABS
2.5000 mg | ORAL_TABLET | Freq: Two times a day (BID) | ORAL | Status: DC
  Administered 2023-09-25 – 2023-09-29 (×9): 2.5 mg via ORAL
  Filled 2023-09-25 (×9): qty 1

## 2023-09-25 NOTE — Consult Note (Addendum)
Advanced Heart Failure Team Consult Note   Primary Physician: Georganna Skeans, MD PCP-Cardiologist:  None  Reason for Consultation: A/C HFrEF   HPI:    Brittany [name] is seen today for evaluation of A/C HFrEF at the request of Dr Pola Corn.    Brittany [name] is a 62 year old with a history of  PAF,  previous smoker quit 2015  years ago, hypertension, 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   LHC 02/12/19: Significant underlying two-vessel coronary artery disease with patent SVG to OM and SVG to diagonal which supplies the LAD territory.  Atretic LIMA to LAD given competitive flow from SVG to diagonal.  Severe ostial ramus artery stenosis not supplied by bypass with his area suggestive of plaque rupture. Had successful angioplasty and DES to ostial ramus extending to distal main.   This is 3rd admit for COPD + HF in the last 6 months. She has been followed closey in the HF clinic.   Most recent ECHO May 2024 EF 20-25% RV mildly reduced.   Device interrogation suggestive of fluid accumulation for last few weeks. She has been eating out 2-3 times a week. On Friday she had pizza and thought  she could take extra fluid medication to avoid fluid. Yesterday she developed orthopnea and dyspnea at rest. Took torsemide and metolazone but didn't have much urine output. Never felt this bad so she called EMS.   Presented to ED via EMS with acute/chronic respiratory failure. CXR with vascular congestion. Placed on Bipap. Given steroids , nebs, and IV lasix. Now on 4 liters [place]. Pertinent admission labs: BNP > 4000 , HS Trop 65>114, K 3.5, creatinine 1.7, and CO2 24.    Review of Systems: [y] = yes, [ ]  = no   General: Weight gain [ Y]; Weight loss [ ] ; Anorexia [ ] ; Fatigue [Y ]; Fever [ ] ; Chills [ ] ; Weakness [ ]    Cardiac: Chest pain/pressure [ ] ; Resting SOB [Y ]; Exertional SOB [ Y]; Orthopnea [ Y]; Pedal Edema [ ] ; Palpitations [ ] ; Syncope [ ] ; Presyncope [ ] ; Paroxysmal nocturnal dyspnea[ ]   Pulmonary: Cough [ ] ; Wheezing[ Y]; Hemoptysis[ ] ; Sputum [ ] ; Snoring [ ]   GI: Vomiting[ ] ; Dysphagia[ ] ; Melena[ ] ; Hematochezia [ ] ; Heartburn[ ] ; Abdominal pain [ ] ; Constipation [ ] ; Diarrhea [ ] ; BRBPR [ ]   GU: Hematuria[ ] ; Dysuria [ ] ; Nocturia[ ]   Vascular: Pain in legs with walking [ ] ; Pain in feet with lying flat [ ] ; Non-healing sores [ ] ; Stroke [ ] ; TIA [ ] ; Slurred speech [ ] ;  Neuro: Headaches[ ] ; Vertigo[ ] ; Seizures[ ] ; Paresthesias[ ] ;Blurred vision [ ] ; Diplopia [ ] ; Vision changes [ ]   Ortho/Skin: Arthritis [ ] ; Joint pain [ Y]; Muscle pain [ ] ; Joint swelling [ ] ; Back Pain [Y ]; Rash [ ]   Psych: Depression[ ] ; Anxiety[ ]   Heme: Bleeding problems [ ] ; Clotting disorders [ ] ; Anemia [ ]   Endocrine: Diabetes [ ] ; Thyroid dysfunction[ ]   Home Medications Prior to Admission medications   Medication Sig Start Date End Date Taking? Authorizing Provider  albuterol (VENTOLIN HFA) 108 (90 Base) MCG/ACT inhaler Inhale 2 puffs into the lungs every 6 (six) hours as needed for wheezing or shortness of breath. 06/20/22  Yes Zannie Cove, MD  apixaban Everlene Balls) 2.5  MG TABS tablet Take 1 tablet (2.5 mg total) by mouth 2 (two) times daily. 04/17/23  Yes Clegg, Amy D, NP  atorvastatin (LIPITOR) 80 MG tablet Take 1 tablet (80 mg total) by mouth daily. 08/06/23 11/04/23 Yes Lockie Mola, MD  dapagliflozin propanediol (FARXIGA) 10 MG TABS tablet Take 1 tablet by mouth daily. 09/04/22  Yes Meekah Math, Bevelyn Buckles, MD  digoxin (LANOXIN) 0.125 MG tablet TAKE 1 TABLET BY MOUTH DAILY 04/23/23  Yes [name], [place], FNP  Fluticasone-Umeclidin-Vilant (TRELEGY ELLIPTA) 200-62.5-25 MCG/ACT AEPB Inhale 1 puff into the lungs daily. 08/16/23  Yes Olalere, Adewale A, MD  guaiFENesin-dextromethorphan (ROBITUSSIN DM) 100-10  MG/5ML syrup Take 5 mLs by mouth every 4 (four) hours as needed for cough.   Yes [provider]  hydrOXYzine (VISTARIL) 25 MG capsule TAKE 1 CAPSULE (25 MG TOTAL) BY MOUTH DAILY AS NEEDED. 07/30/23  Yes Zonia Kief, Amy J, NP  ipratropium-albuterol (DUONEB) 0.5-2.5 (3) MG/3ML SOLN Take 3 mLs by nebulization every 4 (four) hours as needed (for shortness of breath). 07/12/22  Yes Laurey Morale, MD  ivabradine (CORLANOR) 7.5 MG TABS tablet Take 1 tablet (7.5 mg total) by mouth 2 (two) times daily with a meal. TAKE 1 TABLET (7.5 MG TOTAL) BY MOUTH 2 (TWO) TIMES DAILY WITH A MEAL. 10/10/22  Yes Tres Grzywacz, Bevelyn Buckles, MD  metolazone (ZAROXOLYN) 2.5 MG tablet Take 1 tablet (2.5 mg total) by mouth once a week. Take every Wednesday with extra 40 mEq of Potassium Patient taking differently: Take 2.5 mg by mouth See admin instructions. Take 2.5mg  by mouth as needed for fluid 06/27/23 09/25/23 Yes [name], Anderson Malta, FNP  nitroGLYCERIN (NITROSTAT) 0.4 MG SL tablet Place 1 tablet (0.4 mg total) under the tongue every 5 (five) minutes as needed for chest pain. 02/14/19  Yes Graciella Freer, PA-C  Nutritional Supplements (ENSURE ORIGINAL) LIQD Take 2-3 Bottles by mouth daily.   Yes [provider]  potassium chloride (KLOR-CON) 10 MEQ tablet Take 4 tablets (40 mEq total) by mouth 2 (two) times daily. Take extra 4 tablets (40 mEq total) with weekly Metolazone dose Patient taking differently: Take 40 mEq by mouth 2 (two) times daily. Take extra 8 tablets (40 mEq total) with Metolazone dose 06/27/23 09/25/23 Yes [name], Anderson Malta, FNP  spironolactone (ALDACTONE) 25 MG tablet Take 1 tablet (25 mg total) by mouth at bedtime. 05/28/23 09/25/23 Yes [name], Anderson Malta, FNP  torsemide (DEMADEX) 20 MG tablet Take 4 tablets (80 mg total) by mouth 2 (two) times daily. Patient taking differently: Take 40-80 mg by mouth See admin instructions. Take 80mg  by mouth once daily and an additional 40-60mg  as needed for  fluid. 06/27/23 09/25/23 Yes Jacklynn Ganong, FNP    Past Medical History: Past Medical History:  Diagnosis Date   Acute respiratory failure (HCC) 05/05/2018   Acute systolic congestive heart failure (HCC) 02/03/2018   AICD (automatic cardioverter/defibrillator) present    Allergy    Anxiety    Asthma    DM2 (diabetes mellitus, type 2) (HCC) 10/20/2020   Hypertension    PAF (paroxysmal atrial fibrillation) (HCC)    Presence of permanent cardiac pacemaker    Prophylactic measure 08/03/14-08/19/14   Prophyl. 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   LIMA to LAD, SVG to D1, SVG to OM1, EVH via right thigh with implantation of Impella LD LVAD via direct aortic approach   Small cell lung cancer (HCC) 03/16/2014    Past Surgical History: Past Surgical  History:  Procedure Laterality Date   BRONCHIAL BRUSHINGS  10/25/2020   Procedure: BRONCHIAL BRUSHINGS;  Surgeon: Leslye Peer, MD;  Location: [facility] ENDOSCOPY;  Service: Pulmonary;;   BRONCHIAL NEEDLE ASPIRATION BIOPSY  10/25/2020   Procedure: BRONCHIAL NEEDLE ASPIRATION BIOPSIES;  Surgeon: Leslye Peer, MD;  Location: [facility] ENDOSCOPY;  Service: Pulmonary;;   CARDIAC DEFIBRILLATOR PLACEMENT  08/15/2018   MDT Visia AF MRI VR ICD implanted by Dr Georgena Spurling for primary prevention of sudden   CESAREAN SECTION     CORONARY ARTERY BYPASS GRAFT N/A 02/03/2018   Procedure: CORONARY ARTERY BYPASS GRAFTING (CABG);  Surgeon: Purcell Nails, MD;  Location: [facility] OR;  Service: Open Heart Surgery;  Laterality: N/A;  Time 3 using left internal mammary artery and endoscopically harvested right saphenous vein   CORONARY BALLOON ANGIOPLASTY N/A 02/03/2018   Procedure: CORONARY BALLOON ANGIOPLASTY;  Surgeon: Swaziland, Peter M, MD;  Location: [facility] INVASIVE CV LAB;  Service: Cardiovascular;  Laterality: N/A;   CORONARY STENT INTERVENTION N/A 02/12/2019   Procedure: CORONARY STENT INTERVENTION;  Surgeon: Iran Ouch, MD;  Location: [facility] INVASIVE CV  LAB;  Service: Cardiovascular;  Laterality: N/A;   CORONARY/GRAFT ACUTE MI REVASCULARIZATION N/A 02/03/2018   Procedure: Coronary/Graft Acute MI Revascularization;  Surgeon: Swaziland, Peter M, MD;  Location: [facility] INVASIVE CV LAB;  Service: Cardiovascular;  Laterality: N/A;   ENDOBRONCHIAL ULTRASOUND N/A 10/25/2020   Procedure: ENDOBRONCHIAL ULTRASOUND;  Surgeon: Leslye Peer, MD;  Location: [facility] ENDOSCOPY;  Service: Pulmonary;  Laterality: N/A;   FLEXIBLE BRONCHOSCOPY  10/25/2020   Procedure: FLEXIBLE BRONCHOSCOPY;  Surgeon: Leslye Peer, MD;  Location: [facility] ENDOSCOPY;  Service: Pulmonary;;   IABP INSERTION N/A 02/03/2018   Procedure: IABP Insertion;  Surgeon: Swaziland, Peter M, MD;  Location: [facility] INVASIVE CV LAB;  Service: Cardiovascular;  Laterality: N/A;   INTRAOPERATIVE TRANSESOPHAGEAL ECHOCARDIOGRAM N/A 02/03/2018   Procedure: INTRAOPERATIVE TRANSESOPHAGEAL ECHOCARDIOGRAM;  Surgeon: Purcell Nails, MD;  Location: [facility] OR;  Service: Open Heart Surgery;  Laterality: N/A;   LEFT HEART CATH AND CORONARY ANGIOGRAPHY N/A 02/03/2018   Procedure: LEFT HEART CATH AND CORONARY ANGIOGRAPHY;  Surgeon: Swaziland, Peter M, MD;  Location: [facility] INVASIVE CV LAB;  Service: Cardiovascular;  Laterality: N/A;   LEFT HEART CATH AND CORS/GRAFTS ANGIOGRAPHY N/A 02/12/2019   Procedure: LEFT HEART CATH AND CORS/GRAFTS ANGIOGRAPHY;  Surgeon: Iran Ouch, MD;  Location: [facility] INVASIVE CV LAB;  Service: Cardiovascular;  Laterality: N/A;   MEDIASTINOSCOPY N/A 03/11/2014   Procedure: MEDIASTINOSCOPY;  Surgeon: Loreli Slot, MD;  Location: [facility] OR;  Service: Thoracic;  Laterality: N/A;   PLACEMENT OF IMPELLA LEFT VENTRICULAR ASSIST DEVICE  02/03/2018   Procedure: PLACEMENT OF IMPELLA LEFT VENTRICULAR ASSIST DEVICE LD;  Surgeon: Purcell Nails, MD;  Location: [facility] OR;  Service: Open Heart Surgery;;   REMOVAL OF IMPELLA LEFT VENTRICULAR ASSIST DEVICE N/A 02/08/2018   Procedure: REMOVAL OF IMPELLA LEFT VENTRICULAR ASSIST DEVICE;   Surgeon: Purcell Nails, MD;  Location: [facility] OR;  Service: Open Heart Surgery;  Laterality: N/A;   RIGHT HEART CATH N/A 02/03/2018   Procedure: RIGHT HEART CATH;  Surgeon: Swaziland, Peter M, MD;  Location: [facility] INVASIVE CV LAB;  Service: Cardiovascular;  Laterality: N/A;   RIGHT HEART CATH N/A 05/09/2018   Procedure: RIGHT HEART CATH;  Surgeon: Dolores Patty, MD;  Location: [facility] INVASIVE CV LAB;  Service: Cardiovascular;  Laterality: N/A;   TEE WITHOUT CARDIOVERSION N/A 02/08/2018   Procedure: TRANSESOPHAGEAL ECHOCARDIOGRAM (TEE);  Surgeon: Purcell Nails, MD;  Location: [facility] OR;  Service: Open Heart Surgery;  Laterality: N/A;   TUBAL LIGATION     VIDEO BRONCHOSCOPY WITH ENDOBRONCHIAL ULTRASOUND N/A 03/11/2014   Procedure: VIDEO BRONCHOSCOPY WITH ENDOBRONCHIAL ULTRASOUND;  Surgeon: Loreli Slot, MD;  Location: [facility] OR;  Service: Thoracic;  Laterality: N/A;    Family History: Family History  Problem Relation Age of Onset   Heart disease Mother    Hypertension Mother    Heart attack Mother    Hypertension Maternal Grandmother    Cancer Maternal Grandmother    Diabetes Paternal Grandmother    Stroke Neg Hx     Social History: Social History   Socioeconomic History   Marital status: Married    Spouse name: Tristan Schroeder    Number of children: 1   Years of education: Not on file   Highest education level: Not on file  Occupational History   Occupation: disabled  Tobacco Use   Smoking status: Former    Current packs/day: 0.00    Average packs/day: 1 pack/day for 20.0 years (20.0 ttl pk-yrs)    Types: Cigarettes    Start date: 03/13/1994    Quit date: 03/13/2014    Years since quitting: 9.5   Smokeless tobacco: Never  Vaping Use   Vaping status: Never Used  Substance and Sexual Activity   Alcohol use: Yes    Alcohol/week: 5.0 standard drinks of alcohol    Types: 5 Glasses of wine per week    Comment: a few glasses of wine weekly   Drug use: Yes    Types: Marijuana     Comment: "medicinal", not daily   Sexual activity: Never  Other Topics Concern   Not on file  Social History Narrative   Not on file   Social Determinants of Health   Financial Resource Strain: Medium Risk (02/11/2021)   Overall Financial Resource Strain (CARDIA)    Difficulty of Paying Living Expenses: Somewhat hard  Food Insecurity: No Food Insecurity (08/03/2023)   Hunger Vital Sign    Worried About Running Out of Food in the Last Year: Never true    Ran Out of Food in the Last Year: Never true  Transportation Needs: No Transportation Needs (08/03/2023)   PRAPARE - Administrator, Civil Service (Medical): No    Lack of Transportation (Non-Medical): No  Physical Activity: Insufficiently Active (02/11/2021)   Exercise Vital Sign    Days of Exercise per Week: 2 days    Minutes of Exercise per Session: 10 min  Stress: Stress Concern Present (02/11/2021)   [employer] of Occupational Health - Occupational Stress Questionnaire    Feeling of Stress : To some extent  Social Connections: Unknown (02/11/2021)   Social Connection and Isolation Panel [NHANES]    Frequency of Communication with Friends and Family: More than three times a week    Frequency of Social Gatherings with Friends and Family: Twice a week    Attends Religious Services: Not on Marketing executive or Organizations: Not on file    Attends Banker Meetings: Never    Marital Status: Married    Allergies:  Allergies  Allergen Reactions   Lactose Intolerance (Gi)     Per patient   Codeine Nausea And Vomiting    Objective:    Vital Signs:   Temp:  [96.3 F (35.7 C)-96.9 F (36.1 C)] 96.9 F (36.1 C) (10/15 0802) Pulse Rate:  [75-118] 75 (10/15 1015) Resp:  [16-30]  19 (10/15 1015) BP: (92-158)/(56-109) 96/56 (10/15 1015) SpO2:  [97 %-100 %] 99 % (10/15 1015) FiO2 (%):  [40 %] 40 % (10/15 0433)    Weight change: There were no vitals filed for this  visit.  Intake/Output:  No intake or output data in the 24 hours ending 09/25/23 1033    Physical Exam    General:  In bed . No resp difficulty HEENT: normal Neck: supple. JVP to jaw  . Carotids 2+ bilat; no bruits. No lymphadenopathy or thyromegaly appreciated. Cor: PMI nondisplaced. Regular rate & rhythm. No rubs, gallops or murmurs. Lungs: clear on 4 liters.  Abdomen: soft, nontender, distended. No hepatosplenomegaly. No bruits or masses. Good bowel sounds. Extremities: no cyanosis, clubbing, rash, edema Neuro: alert & orientedx3, cranial nerves grossly intact. moves all 4 extremities w/o difficulty. Affect pleasant   Telemetry   SR 80s   EKG   SR 89 bpm  Labs   Basic Metabolic Panel: Recent Labs  Lab 09/25/23 0421 09/25/23 0428 09/25/23 0527  NA 140  137 140 138  K 3.5  3.4* 3.3* 2.7*  CL 98 101  --   CO2 24  --   --   GLUCOSE 379* 380*  --   BUN 22 24*  --   CREATININE 1.66* 1.60*  --   CALCIUM 9.4  --   --   MG 4.5*  --   --     Liver Function Tests: Recent Labs  Lab 09/25/23 0421  AST 40  ALT 21  ALKPHOS 90  BILITOT 1.1  PROT 7.5  ALBUMIN 4.0   No results for input(s): "LIPASE", "AMYLASE" in the last 168 hours. No results for input(s): "AMMONIA" in the last 168 hours.  CBC: Recent Labs  Lab 09/25/23 0421 09/25/23 0428 09/25/23 0527  WBC 12.8*  --   --   NEUTROABS 9.5*  --   --   HGB 14.5  15.6* 17.0* 16.3*  HCT 47.1*  46.0 50.0* 48.0*  MCV 92.7  --   --   PLT 208  --   --     Cardiac Enzymes: No results for input(s): "CKTOTAL", "CKMB", "CKMBINDEX", "TROPONINI" in the last 168 hours.  BNP: BNP (last 3 results) Recent Labs    08/03/23 1733 08/16/23 1458 09/25/23 0421  BNP 2,209.9* 1,342.5* 4,334.8*    ProBNP (last 3 results) No results for input(s): "PROBNP" in the last 8760 hours.   CBG: Recent Labs  Lab 09/25/23 0416 09/25/23 0831  GLUCAP 359* 134*    Coagulation Studies: No results for input(s): "LABPROT",  "INR" in the last 72 hours.   Imaging   DG Chest Port 1 View  Result Date: 09/25/2023 CLINICAL DATA:  Dyspnea and respiratory distress. EXAM: PORTABLE CHEST 1 VIEW COMPARISON:  Portable chest 08/03/2023 FINDINGS: 4:12 a.m. The heart is enlarged. 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 Left-sided single lead pacemaker/AID with stable wire insertions. There is mild perihilar vascular congestion mild interstitial edema in the lower lung zones with minimal pleural effusions. Lungs are emphysematous and clear of focal infiltrates. The mediastinum is normally outlined. A right chest battery with single lead extending into the right neck is again noted. No new osseous findings. IMPRESSION: 1. Cardiomegaly with mild perihilar vascular congestion and mild interstitial edema. 2. Emphysema. No focal infiltrates. 3. Minimal pleural effusions are beginning to form. Electronically Signed   By: Almira Bar M.D.   On: 09/25/2023 06:05     Medications:     Current Medications:  apixaban  2.5 mg Oral BID   atorvastatin  80 mg Oral Daily   digoxin  125 mcg Oral Daily   insulin aspart  0-5 Units Subcutaneous QHS   insulin aspart  0-9 Units Subcutaneous TID WC    Infusions:     Patient Profile    Brittany [name] is a 62 year old with a history of  PAF,  previous smoker quit 2015  years ago, hypertension, 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    Admitting with A/C HFrEF    Assessment/Plan  1. Acute/Chronic Hypoxic Respiratory Failure -COPD frequent exacerbations + A/C HFrEF -Initially on Bipap, now on 4 liters [place]. WBC 12.8. No wheeze.  -Diurese with IV lasix.   2. A/C HFrEF, ICM EF has been 20-25% for the last 5 years. Most recent ECHO 2024 EF 20-25% RV mildly reduced. Presented with NYHA IV functional class.  Volume overload  in the setting of high sodium diet. Recently eating out and consuming high sodium diet. Optivol has suggestive of fluid accumulation for the last 2-3 weeks.  BNP >4000. CXR with vascular congestion. On exam she appears warm/wet.  -Increase lasix to 80 mg twice a day -Dig level < 0.2. Continue for now.  -Restart spironolactone 12.5 mg daily  -Hold off on SGLT2i but will restart prior to d/c  -Not a candidate for advanced therapies due to severity of lung disease.  - Discussed low salt food choices.   3. CKD Stage IIIb  -Creatinine baseline ~ 1.4-1.6  -Follow daily BMET  4. PAF  -Maintaining SR.  -Continue eliquis 2.5 mg twice a day. Reduced dose (weight/creatinine)  5. CAD -History of CABG x2 2019 -2020 DES ostial ramus extending to left main  - No chest pain.  - HS Trop 41>65   6. H/O SCLC Completed treatment 2015. CT chest (5/24) soft tissue thickening along mediastinum but no discrete mass   7. H/O Renal Infarct 2022   Length of Stay: 0  Tonye Becket, NP  09/25/2023, 10:33 AM  Advanced Heart Failure Team Pager [phone] (M-F; 7a - 5p)  Please contact [facility] for night-coverage after hours (4p -7a ) and weekends on [email or web address]  Patient seen and examined with the above-signed Advanced Practice Provider and/or Housestaff. I personally reviewed laboratory data, imaging studies and relevant notes. I independently examined the patient and formulated the important aspects of the plan. I have edited the note to reflect any of my changes or salient points. I have personally discussed the plan with the patient and/or family.  62 y/o woman with severe COPD, systolic HD due to iCM EF 25%. Well known to [place] from HF Clinic.   Now presents to ED with significant volume overload in setting of dietary indiscretion.   General:  Sitting up in bed. + SOB HEENT: normal Neck: supple. JVP to jaw  Cor:  Regular rate & rhythm. No rubs, gallops or murmurs. Lungs: coarse throughout Abdomen:  soft, nontender, nondistended. No hepatosplenomegaly. No bruits or masses. Good bowel sounds. Extremities: no cyanosis, clubbing, rash, 2+ edema Neuro: alert & orientedx3, cranial nerves grossly intact. moves all 4 extremities w/o difficulty. Affect pleasant  She is markedly volume overloaded. No evidence of low output currently. Start IV lasix. Watch renal function closely. Hstrop slightly elevated but doubt ACS- suspect related to HF.  Arvilla Meres, MD  3:16 PM

## 2023-09-25 NOTE — ED Provider Notes (Signed)
[place] EMERGENCY DEPARTMENT AT [facility] Provider Note   CSN: [id number] Arrival date & time: 09/25/23  0403     History  Chief Complaint  Patient presents with   Respiratory Distress    Brittany [name] is a 62 y.o. female.  HPI Patient presents for respiratory distress.  Medical history includes lung cancer, COPD, CHF, atrial fibrillation, HTN, DM, HLD.  Initial history is provided by EMS.  Patient has reportedly had worsening shortness of breath throughout the day today.  EMS was called to a motel.  When EMS arrived on scene, patient was in respiratory distress.  She was given IV magnesium, Solu-Medrol, nebulized breathing treatments, and intramuscular epinephrine prior to arrival.  Patient became unresponsive during transit.  EMS did not see oxygen concentrator at hotel room.  Per chart review, patient has known EF of 20 to 25%.  Last hospital admission was 6 weeks ago.      Home Medications Prior to Admission medications   Medication Sig Start Date End Date Taking? Authorizing Provider  albuterol (VENTOLIN HFA) 108 (90 Base) MCG/ACT inhaler Inhale 2 puffs into the lungs every 6 (six) hours as needed for wheezing or shortness of breath. 06/20/22   Zannie Cove, MD  apixaban (ELIQUIS) 2.5 MG TABS tablet Take 1 tablet (2.5 mg total) by mouth 2 (two) times daily. 04/17/23   Clegg, Amy D, NP  atorvastatin (LIPITOR) 80 MG tablet Take 1 tablet (80 mg total) by mouth daily. 08/06/23 11/04/23  Lockie Mola, MD  blood glucose meter kit and supplies KIT Dispense based on patient and insurance preference. Use up to four times daily as directed. Patient taking differently: Inject 1 each into the skin See admin instructions. Dispense based on patient and insurance preference. Use up to four times daily as directed. 04/11/21   Osvaldo Shipper, MD  dapagliflozin propanediol (FARXIGA) 10 MG TABS tablet Take 1 tablet by mouth daily. Patient taking differently: Take 10 mg by  mouth daily. 09/04/22   Bensimhon, Bevelyn Buckles, MD  digoxin (LANOXIN) 0.125 MG tablet TAKE 1 TABLET BY MOUTH DAILY 04/23/23   Jacklynn Ganong, FNP  Fluticasone-Umeclidin-Vilant (TRELEGY ELLIPTA) 200-62.5-25 MCG/ACT AEPB Inhale 1 puff into the lungs daily. 08/16/23   Olalere, Minna Antis, MD  guaiFENesin-dextromethorphan (ROBITUSSIN DM) 100-10 MG/5ML syrup Take 5 mLs by mouth every 4 (four) hours as needed for cough (chest congestion). 08/06/23   Lockie Mola, MD  hydrOXYzine (VISTARIL) 25 MG capsule TAKE 1 CAPSULE (25 MG TOTAL) BY MOUTH DAILY AS NEEDED. Patient taking differently: Take 25 mg by mouth daily as needed for anxiety. 07/30/23   Rema Fendt, NP  ipratropium-albuterol (DUONEB) 0.5-2.5 (3) MG/3ML SOLN Take 3 mLs by nebulization every 4 (four) hours as needed (for shortness of breath). 07/12/22   Laurey Morale, MD  ivabradine (CORLANOR) 7.5 MG TABS tablet Take 1 tablet (7.5 mg total) by mouth 2 (two) times daily with a meal. TAKE 1 TABLET (7.5 MG TOTAL) BY MOUTH 2 (TWO) TIMES DAILY WITH A MEAL. 10/10/22   Bensimhon, Bevelyn Buckles, MD  metolazone (ZAROXOLYN) 2.5 MG tablet Take 1 tablet (2.5 mg total) by mouth once a week. Take every Wednesday with extra 40 mEq of Potassium Patient not taking: Reported on 08/16/2023 06/27/23 09/25/23  Jacklynn Ganong, FNP  naphazoline-pheniramine (VISINE) 0.025-0.3 % ophthalmic solution Place 1 drop into both eyes 4 (four) times daily as needed for eye irritation.    [provider]  nitroGLYCERIN (NITROSTAT) 0.4 MG SL tablet Place 1  tablet (0.4 mg total) under the tongue every 5 (five) minutes as needed for chest pain. Patient not taking: Reported on 08/16/2023 02/14/19   Graciella Freer, PA-C  Nutritional Supplements (ENSURE ORIGINAL) LIQD Take 2 Bottles by mouth daily.    [provider]  potassium chloride (KLOR-CON) 10 MEQ tablet Take 4 tablets (40 mEq total) by mouth 2 (two) times daily. Take extra 4 tablets (40 mEq total) with weekly  Metolazone dose 06/27/23 09/25/23  [name], Anderson Malta, FNP  spironolactone (ALDACTONE) 25 MG tablet Take 1 tablet (25 mg total) by mouth at bedtime. 05/28/23 08/26/23  Jacklynn Ganong, FNP  torsemide (DEMADEX) 20 MG tablet Take 4 tablets (80 mg total) by mouth 2 (two) times daily. 06/27/23 09/25/23  Jacklynn Ganong, FNP      Allergies    Lactose intolerance (gi) and Codeine    Review of Systems   Review of Systems  Unable to perform ROS: Severe respiratory distress    Physical Exam Updated Vital Signs BP 105/60   Pulse 84   Temp (!) 96.3 F (35.7 C) (Axillary)   Resp 16   SpO2 100%  Physical Exam Constitutional:      General: She is in acute distress.     Appearance: She is normal weight. She is ill-appearing. She is not toxic-appearing or diaphoretic.  HENT:     Head: Normocephalic and atraumatic.     Right Ear: External ear normal.     Left Ear: External ear normal.     Nose: Nose normal.     Mouth/Throat:     Mouth: Mucous membranes are moist.  Eyes:     Extraocular Movements: Extraocular movements intact.  Cardiovascular:     Rate and Rhythm: Regular rhythm. Tachycardia present.     Heart sounds: No murmur heard. Pulmonary:     Effort: Tachypnea, prolonged expiration and respiratory distress present.     Breath sounds: Decreased air movement present. Decreased breath sounds, wheezing and rales present.  Abdominal:     General: There is no distension.     Palpations: Abdomen is soft.     Tenderness: There is no abdominal tenderness.  Musculoskeletal:     Cervical back: Neck supple. No rigidity.  Skin:    General: Skin is warm and dry.  Neurological:     General: No focal deficit present.     ED Results / Procedures / Treatments   Labs (all labs ordered are listed, but only abnormal results are displayed) Labs Reviewed  COMPREHENSIVE METABOLIC PANEL - Abnormal; Notable for the following components:      Result Value   Glucose, Bld 379 (*)    Creatinine,  Ser 1.66 (*)    GFR, Estimated 34 (*)    Anion gap 18 (*)    All other components within normal limits  CBC WITH DIFFERENTIAL/PLATELET - Abnormal; Notable for the following components:   WBC 12.8 (*)    HCT 47.1 (*)    RDW 19.4 (*)    Neutro Abs 9.5 (*)    Abs Immature Granulocytes 0.12 (*)    All other components within normal limits  BRAIN NATRIURETIC PEPTIDE - Abnormal; Notable for the following components:   B Natriuretic Peptide 4,334.8 (*)    All other components within normal limits  DIGOXIN LEVEL - Abnormal; Notable for the following components:   Digoxin Level <0.2 (*)    All other components within normal limits  MAGNESIUM - Abnormal; Notable for the following components:  Magnesium 4.5 (*)    All other components within normal limits  I-STAT VENOUS BLOOD GAS, ED - Abnormal; Notable for the following components:   Bicarbonate 30.3 (*)    Acid-Base Excess 3.0 (*)    Potassium 2.7 (*)    Calcium, Ion 1.14 (*)    HCT 48.0 (*)    Hemoglobin 16.3 (*)    All other components within normal limits  CBG MONITORING, ED - Abnormal; Notable for the following components:   Glucose-Capillary 359 (*)    All other components within normal limits  I-STAT CHEM 8, ED - Abnormal; Notable for the following components:   Potassium 3.3 (*)    BUN 24 (*)    Creatinine, Ser 1.60 (*)    Glucose, Bld 380 (*)    Calcium, Ion 1.12 (*)    Hemoglobin 17.0 (*)    HCT 50.0 (*)    All other components within normal limits  I-STAT ARTERIAL BLOOD GAS, ED - Abnormal; Notable for the following components:   pH, Arterial 7.219 (*)    pCO2 arterial 73.3 (*)    pO2, Arterial 279 (*)    Bicarbonate 30.4 (*)    TCO2 33 (*)    Potassium 3.4 (*)    Hemoglobin 15.6 (*)    All other components within normal limits  TROPONIN I (HIGH SENSITIVITY) - Abnormal; Notable for the following components:   Troponin I (High Sensitivity) 65 (*)    All other components within normal limits  RESP PANEL BY RT-PCR  (RSV, FLU A&B, COVID)  [id number]  I-STAT ARTERIAL BLOOD GAS, ED  TROPONIN I (HIGH SENSITIVITY)    EKG EKG Interpretation Date/Time:  Tuesday September 25 2023 04:29:26 EDT Ventricular Rate:  110 PR Interval:  151 QRS Duration:  139 QT Interval:  376 QTC Calculation: 509 R Axis:   121  Text Interpretation: Sinus rhythm Right atrial enlargement Consider left ventricular hypertrophy Artifact in lead(s) I II III aVR aVL aVF V1 V3 V4 V5 V6 Confirmed by Gloris Manchester ([id number]) on 09/25/2023 5:28:26 AM  Radiology DG Chest Port 1 View  Result Date: 09/25/2023 CLINICAL DATA:  Dyspnea and respiratory distress. EXAM: PORTABLE CHEST 1 VIEW COMPARISON:  Portable chest 08/03/2023 FINDINGS: 4:12 a.m. The heart is enlarged. 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 Left-sided single lead pacemaker/AID with stable wire insertions. There is mild perihilar vascular congestion mild interstitial edema in the lower lung zones with minimal pleural effusions. Lungs are emphysematous and clear of focal infiltrates. The mediastinum is normally outlined. A right chest battery with single lead extending into the right neck is again noted. No new osseous findings. IMPRESSION: 1. Cardiomegaly with mild perihilar vascular congestion and mild interstitial edema. 2. Emphysema. No focal infiltrates. 3. Minimal pleural effusions are beginning to form. Electronically Signed   By: Almira Bar M.D.   On: 09/25/2023 06:05    Procedures Procedures    Medications Ordered in ED Medications  ondansetron (ZOFRAN) injection 4 mg (4 mg Intravenous Given by Other 09/25/23 0407)  albuterol (PROVENTIL) (2.5 MG/3ML) 0.083% nebulizer solution (10 mg/hr Nebulization Given 09/25/23 0433)  insulin aspart (novoLOG) injection 7 Units (7 Units Subcutaneous Given 09/25/23 0506)  furosemide (LASIX) injection 40 mg (40 mg Intravenous Given 09/25/23 0507)  potassium chloride SA (KLOR-CON M) CR tablet 40 mEq (40 mEq Oral Given 09/25/23 0865)    ED Course/ Medical  Decision Making/ A&P  Medical Decision Making Amount and/or Complexity of Data Reviewed Labs: ordered. Radiology: ordered.  Risk Prescription drug management. Decision regarding hospitalization.   This patient presents to the ED for concern of respiratory distress, this involves an extensive number of treatment options, and is a complaint that carries with it a high risk of complications and morbidity.  The differential diagnosis includes CHF exacerbation, COPD exacerbation, pneumonia, ACS, pleural effusion, pneumothorax, pericardial effusion, neoplasm   Co morbidities that complicate the patient evaluation  lung cancer, COPD, CHF, atrial fibrillation, HTN, DM, HLD   Additional history obtained:  Additional history obtained from EMS External records from outside source obtained and reviewed including EMR   Lab Tests:  I Ordered, and personally interpreted labs.  The pertinent results include: Baseline creatinine, hypermagnesemia with otherwise normal electrolytes, normal hemoglobin, mild leukocytosis.  Slight elevation in troponin, respiratory acidosis on blood gas.   Imaging Studies ordered:  I ordered imaging studies including chest x-ray I independently visualized and interpreted imaging which showed cardiomegaly with vascular congestion and mild pulmonary edema I agree with the radiologist interpretation   Cardiac Monitoring: / EKG:  The patient was maintained on a cardiac monitor.  I personally viewed and interpreted the cardiac monitored which showed an underlying rhythm of: Sinus rhythm   Problem List / ED Course / Critical interventions / Medication management  Patient presents for respiratory distress.  Per EMS, she has had worsening shortness of breath throughout the day today.  She received treatment for COPD prior to arrival: Solu-Medrol, magnesium, epinephrine, and breathing treatments.  EMS noted loss of consciousness during  transit.  On arrival, patient is awake but lethargic.  She is tachypneic with increased work of breathing.  On lung auscultation she has diminished breath sounds.  On bedside ultrasound, she does have apical B-lines.  Breathing was momentarily assisted with BVM.  Patient was placed on BiPAP.  Continuous albuterol was ordered.  After initiation of BiPAP, blood pressure was elevated in the range of 160 SBP.  She has known CHF with estimated EF of 20 to 25%.  Initially, nitroglycerin was ordered to lower afterload.  Patient's blood pressure improved without nitro.  Lab work was notable for respiratory acidosis on blood gas, hyperglycemia, leukocytosis, and hypermagnesemia.  Hypermagnesemia is iatrogenic from magnesium given prior to arrival.  Patient improved dramatically on BiPAP.  After 1 hour, she was able to provide history.  She stated that she has a living will and would not want intubation.  Repeat blood gas showed significant improvement.  Patient was admitted for further management. I ordered medication including albuterol for COPD; Zofran for nausea; insulin for hyperglycemia; Lasix for diuresis Reevaluation of the patient after these medicines showed that the patient improved I have reviewed the patients home medicines and have made adjustments as needed   Social Determinants of Health:  Lives independently  CRITICAL CARE Performed by: Gloris Manchester   Total critical care time: 35 minutes  Critical care time was exclusive of separately billable procedures and treating other patients.  Critical care was necessary to treat or prevent imminent or life-threatening deterioration.  Critical care was time spent personally by me on the following activities: development of treatment plan with patient and/or surrogate as well as nursing, discussions with consultants, evaluation of patient's response to treatment, examination of patient, obtaining history from patient or surrogate, ordering and  performing treatments and interventions, ordering and review of laboratory studies, ordering and review of radiographic studies, pulse oximetry and re-evaluation of patient's condition.  Final Clinical Impression(s) / ED Diagnoses Final diagnoses:  COPD exacerbation (HCC)  Respiratory distress  Acute on chronic systolic congestive heart failure (HCC)  Acute on chronic respiratory failure with hypoxia and hypercapnia [facility])    Rx / DC Orders ED Discharge Orders     None         Gloris Manchester, MD 09/25/23 [phone]
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
## 2023-09-25 NOTE — H&P (Signed)
[facility] History and Physical  Dannica Bodin WGN:[id number] DOB: Dec 23, 1960 DOA: 09/25/2023 PCP: Georganna Skeans, MD  Presented from: Home Chief Complaint: Shortness of breath  History of Present Illness: Brittany [name] is a 62 y.o. female with PMH significant for DM2, HTN, HLD, systolic CHF (EF 20 to 25%) s/p AICD, CAD, A-fib, lung cancer in remission, COPD. Patient was brought to the ED by EMS from home this morning with complaint of shortness of breath progressively worsening for last 7 to 10 days.  Patient lives at home alone.  At baseline, she is ambulatory without assistance, has normal mental status, does not require oxygen for daily activities except for exertion.  She follows up with cardiologist Dr. [name].  She is on torsemide, Aldactone and metolazone.  She has a thoracic impedance monitoring device in place.  Whenever patient feels short of breath, she sends transmission to heart failure team and gets a response. On chart review, I noted that in the last 3 weeks, CHF team had received transmissions from her device suggesting possible fluid accumulation.  She was suggested to increase the dose of Lasix for few days.  Last note was from 10/7.  This morning, patient felt significant short of breath and had to call EMS.  She was given IV mag, Solu-Medrol, neb treatment, and IM epinephrine by EMS prior to arrival.  In the ED, patient was afebrile, heart rate in 100s, breathing in 20s and 30s Blood gas showed acidotic pH of 7.21 with pCO2 73.  She was placed on BiPAP. Other labs showed WBC count 12.8, BNP 4300, troponin 65, BUN/creatinine 22/1.66 Respiratory virus panel negative Chest x-ray showed cardiomegaly, mild perihilar vascular congestion and mild interstitial edema.  Emphysema without infiltrates. She was given IV Lasix 40 mg  Later this morning, VBG was repeated which showed improvement in pH to 7.37 and pCO2 to 51  This morning, at the time of  my evaluation, patient was just taken off BiPAP. She was alert, awake, oriented x 3 and able to give the details of her history.  She was maintaining O2 saturation over 95% on 3 L. History reviewed and detailed as above Patient stated that she has been struggling with CHF since 2019 and has been hospitalized multiple times.  She confirmed DNR/DNI status.   Review of Systems:  All systems were reviewed and were negative unless otherwise mentioned in the HPI   Past medical history: Past Medical History:  Diagnosis Date   Acute respiratory failure (HCC) 05/05/2018   Acute systolic congestive heart failure (HCC) 02/03/2018   AICD (automatic cardioverter/defibrillator) present    Allergy    Anxiety    Asthma    DM2 (diabetes mellitus, type 2) (HCC) 10/20/2020   Hypertension    PAF (paroxysmal atrial fibrillation) (HCC)    Presence of permanent cardiac pacemaker    Prophylactic measure 08/03/14-08/19/14   Prophyl. 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   LIMA to LAD, SVG to D1, SVG to OM1, EVH via right thigh with implantation of Impella LD LVAD via direct aortic approach   Small cell lung cancer (HCC) 03/16/2014    Past surgical history: Past Surgical History:  Procedure Laterality Date   BRONCHIAL BRUSHINGS  10/25/2020   Procedure: BRONCHIAL BRUSHINGS;  Surgeon: Leslye Peer, MD;  Location: [facility] ENDOSCOPY;  Service: Pulmonary;;   BRONCHIAL NEEDLE ASPIRATION BIOPSY  10/25/2020   Procedure: BRONCHIAL NEEDLE ASPIRATION BIOPSIES;  Surgeon: Leslye Peer, MD;  Location: [facility] ENDOSCOPY;  Service: Pulmonary;;   CARDIAC DEFIBRILLATOR PLACEMENT  08/15/2018   MDT Visia AF MRI VR ICD implanted by Dr Georgena Spurling for primary prevention of sudden   CESAREAN SECTION     CORONARY ARTERY BYPASS GRAFT N/A 02/03/2018   Procedure: CORONARY ARTERY BYPASS GRAFTING (CABG);  Surgeon: Purcell Nails, MD;  Location: [facility] OR;  Service: Open Heart Surgery;  Laterality: N/A;  Time 3 using left  internal mammary artery and endoscopically harvested right saphenous vein   CORONARY BALLOON ANGIOPLASTY N/A 02/03/2018   Procedure: CORONARY BALLOON ANGIOPLASTY;  Surgeon: Swaziland, Peter M, MD;  Location: [facility] INVASIVE CV LAB;  Service: Cardiovascular;  Laterality: N/A;   CORONARY STENT INTERVENTION N/A 02/12/2019   Procedure: CORONARY STENT INTERVENTION;  Surgeon: Iran Ouch, MD;  Location: [facility] INVASIVE CV LAB;  Service: Cardiovascular;  Laterality: N/A;   CORONARY/GRAFT ACUTE MI REVASCULARIZATION N/A 02/03/2018   Procedure: Coronary/Graft Acute MI Revascularization;  Surgeon: Swaziland, Peter M, MD;  Location: [facility] INVASIVE CV LAB;  Service: Cardiovascular;  Laterality: N/A;   ENDOBRONCHIAL ULTRASOUND N/A 10/25/2020   Procedure: ENDOBRONCHIAL ULTRASOUND;  Surgeon: Leslye Peer, MD;  Location: [facility] ENDOSCOPY;  Service: Pulmonary;  Laterality: N/A;   FLEXIBLE BRONCHOSCOPY  10/25/2020   Procedure: FLEXIBLE BRONCHOSCOPY;  Surgeon: Leslye Peer, MD;  Location: [facility] ENDOSCOPY;  Service: Pulmonary;;   IABP INSERTION N/A 02/03/2018   Procedure: IABP Insertion;  Surgeon: Swaziland, Peter M, MD;  Location: [facility] INVASIVE CV LAB;  Service: Cardiovascular;  Laterality: N/A;   INTRAOPERATIVE TRANSESOPHAGEAL ECHOCARDIOGRAM N/A 02/03/2018   Procedure: INTRAOPERATIVE TRANSESOPHAGEAL ECHOCARDIOGRAM;  Surgeon: Purcell Nails, MD;  Location: [facility] OR;  Service: Open Heart Surgery;  Laterality: N/A;   LEFT HEART CATH AND CORONARY ANGIOGRAPHY N/A 02/03/2018   Procedure: LEFT HEART CATH AND CORONARY ANGIOGRAPHY;  Surgeon: Swaziland, Peter M, MD;  Location: [facility] INVASIVE CV LAB;  Service: Cardiovascular;  Laterality: N/A;   LEFT HEART CATH AND CORS/GRAFTS ANGIOGRAPHY N/A 02/12/2019   Procedure: LEFT HEART CATH AND CORS/GRAFTS ANGIOGRAPHY;  Surgeon: Iran Ouch, MD;  Location: [facility] INVASIVE CV LAB;  Service: Cardiovascular;  Laterality: N/A;   MEDIASTINOSCOPY N/A 03/11/2014   Procedure: MEDIASTINOSCOPY;  Surgeon: Loreli Slot,  MD;  Location: [facility] OR;  Service: Thoracic;  Laterality: N/A;   PLACEMENT OF IMPELLA LEFT VENTRICULAR ASSIST DEVICE  02/03/2018   Procedure: PLACEMENT OF IMPELLA LEFT VENTRICULAR ASSIST DEVICE LD;  Surgeon: Purcell Nails, MD;  Location: [facility] OR;  Service: Open Heart Surgery;;   REMOVAL OF IMPELLA LEFT VENTRICULAR ASSIST DEVICE N/A 02/08/2018   Procedure: REMOVAL OF IMPELLA LEFT VENTRICULAR ASSIST DEVICE;  Surgeon: Purcell Nails, MD;  Location: [facility] OR;  Service: Open Heart Surgery;  Laterality: N/A;   RIGHT HEART CATH N/A 02/03/2018   Procedure: RIGHT HEART CATH;  Surgeon: Swaziland, Peter M, MD;  Location: [facility] INVASIVE CV LAB;  Service: Cardiovascular;  Laterality: N/A;   RIGHT HEART CATH N/A 05/09/2018   Procedure: RIGHT HEART CATH;  Surgeon: Dolores Patty, MD;  Location: [facility] INVASIVE CV LAB;  Service: Cardiovascular;  Laterality: N/A;   TEE WITHOUT CARDIOVERSION N/A 02/08/2018   Procedure: TRANSESOPHAGEAL ECHOCARDIOGRAM (TEE);  Surgeon: Purcell Nails, MD;  Location: [facility] OR;  Service: Open Heart Surgery;  Laterality: N/A;   TUBAL LIGATION     VIDEO BRONCHOSCOPY WITH ENDOBRONCHIAL ULTRASOUND N/A 03/11/2014   Procedure: VIDEO BRONCHOSCOPY WITH ENDOBRONCHIAL ULTRASOUND;  Surgeon: Loreli Slot, MD;  Location: [facility] OR;  Service: Thoracic;  Laterality: N/A;    Social History:  reports that she quit smoking about 9 years ago. Her smoking use included cigarettes. She started smoking about 29 years ago. She has a 20 pack-year smoking history. She has never used smokeless tobacco. She reports current alcohol use of about 5.0 standard drinks of alcohol per week. She reports current drug use. Drug: Marijuana.  Allergies:  Allergies  Allergen Reactions   Lactose Intolerance (Gi)     Per patient   Codeine Nausea And Vomiting   Lactose intolerance (gi) and Codeine   Family history:  Family History  Problem Relation Age of Onset   Heart disease Mother    Hypertension Mother    Heart attack Mother     Hypertension Maternal Grandmother    Cancer Maternal Grandmother    Diabetes Paternal Grandmother    Stroke Neg Hx      Physical Exam: Vitals:   09/25/23 0745 09/25/23 0800 09/25/23 0802 09/25/23 0815  BP: 103/63 97/65  (!) 92/57  Pulse: 81 82  81  Resp: 20 (!) 26  (!) 25  Temp:   (!) 96.9 F (36.1 C)   TempSrc:   Axillary   SpO2: 99% 99%  99%   Wt Readings from Last 3 Encounters:  08/16/23 43.4 kg  08/06/23 41.4 kg  05/30/23 43.5 kg   There is no height or weight on file to calculate BMI.  General exam: Pleasant, middle-aged female, looks older for age.  Not in pain.  Mild respiratory distress Skin: No rashes, lesions or ulcers. HEENT: Atraumatic, normocephalic, no obvious bleeding Lungs: Clear to auscultation bilaterally at the time of my evaluation.  No wheezing CVS: Regular rate and rhythm, no murmur GI/Abd soft, nontender, nondistended, bowel sound present CNS: Alert, awake, oriented x 3 Psychiatry: Mood appropriate Extremities: No pedal edema, no calf tenderness   ------------------------------------------------------------------------------------------------------ Assessment/Plan: Principal Problem:   Acute CHF (congestive heart failure) (HCC)  Acute respiratory failure with hypoxia and hypercapnia Acute exacerbation of systolic CHF S/p AICD Presented with progressively worsening shortness of breath for last 7 to 10 days Initial workup showed elevated BNP, hypercapnia, chest x-ray with pulm edema 1 dose of IV Lasix was given in the ED. Respiratory status is improved with BiPAP for few hours Currently off BiPAP on 3 L oxygen by nasal cannula Last echo from May 2024 with EF 20 to 25% Repeat echo PTA meds- torsemide 80 mg twice daily, Aldactone 25 mg daily, metolazone 2.5 mg weekly on Wednesdays, digoxin 0.125 mg daily, Farxiga 10 mg daily, Corlanor 7.5 mg twice daily, She follows up with cardiologist Dr. [name].   CHF consult requested Given IV Lasix  earlier in the ED. further diuresis and CHF regimen per heart failure team.  Elevated troponin H/o CAD/CABG HLD Troponin up trending as below.  Likely due to CHF exacerbation. PTA meds-Eliquis 2.5 mg twice daily, Lipitor 80 mg daily Continue both Recent Labs    09/25/23 0421 09/25/23 0610  TROPONINIHS 65* 114*    H/o A-fib Continue digoxin, Eliquis  CKD Creatinine at baseline.  Continue to monitor Recent Labs    08/04/23 0333 08/04/23 1723 08/05/23 0435 08/05/23 1732 08/06/23 0516 08/06/23 1400 08/16/23 1458 09/11/23 0913 09/25/23 0421 09/25/23 0428  BUN 30* 40* 42* 51* 64* 75* 29* 37* 22 24*  CREATININE 1.46* 1.45* 1.52* 1.47* 1.76* 1.90* 1.43* 1.69* 1.66* 1.60*    Type 2 diabetes mellitus Hyperglycemia due to steroids A1c 6.1 on August 2024 PTA meds-none Blood sugar level was elevated to 359 on presentation likely because of IV  Solu-Medrol given by EMS. Start SSI/Accu-Cheks Lab Results  Component Value Date   HGBA1C 6.1 (H) 08/05/2023   Recent Labs  Lab 09/25/23 0416 09/25/23 0831  GLUCAP 359* 134*   COPD Never smoked.  Emphysema noted in chest x-ray Current respiratory compensation is due to CHF exacerbation. As needed bronchodilators to continue  lung cancer  in remission  Mobility: Encourage ambulation  Goals of care   Code Status: Limited: Do not attempt resuscitation (DNR) -DNR-LIMITED -Do Not Intubate/DNI     DVT prophylaxis:  apixaban (ELIQUIS) tablet 2.5 mg Start: 09/25/23 1000 apixaban (ELIQUIS) tablet 2.5 mg   Antimicrobials: None Fluid: None Consultants: CHF team Family Communication: None at bedside  Dispo: The patient is from: Home              Anticipated d/c is to: Home  Diet: Diet Order             Diet heart healthy/carb modified Room service appropriate? Yes; Fluid consistency: Thin  Diet effective now                     ------------------------------------------------------------------------------------- Severity of Illness: The appropriate patient status for this patient is INPATIENT. Inpatient status is judged to be reasonable and necessary in order to provide the required intensity of service to ensure the patient's safety. The patient's presenting symptoms, physical exam findings, and initial radiographic and laboratory data in the context of their chronic comorbidities is felt to place them at high risk for further clinical deterioration. Furthermore, it is not anticipated that the patient will be medically stable for discharge from the hospital within 2 midnights of admission.   * I certify that at the point of admission it is my clinical judgment that the patient will require inpatient hospital care spanning beyond 2 midnights from the point of admission due to high intensity of service, high risk for further deterioration and high frequency of surveillance required.* -------------------------------------------------------------------------------------  Home Meds: Prior to Admission medications   Medication Sig Start Date End Date Taking? Authorizing Provider  albuterol (VENTOLIN HFA) 108 (90 Base) MCG/ACT inhaler Inhale 2 puffs into the lungs every 6 (six) hours as needed for wheezing or shortness of breath. 06/20/22  Yes Zannie Cove, MD  apixaban (ELIQUIS) 2.5 MG TABS tablet Take 1 tablet (2.5 mg total) by mouth 2 (two) times daily. 04/17/23  Yes Clegg, Amy D, NP  atorvastatin (LIPITOR) 80 MG tablet Take 1 tablet (80 mg total) by mouth daily. 08/06/23 11/04/23 Yes Lockie Mola, MD  dapagliflozin propanediol (FARXIGA) 10 MG TABS tablet Take 1 tablet by mouth daily. 09/04/22  Yes Bensimhon, Bevelyn Buckles, MD  digoxin (LANOXIN) 0.125 MG tablet TAKE 1 TABLET BY MOUTH DAILY 04/23/23  Yes [name], [place], FNP  Fluticasone-Umeclidin-Vilant (TRELEGY ELLIPTA) 200-62.5-25 MCG/ACT AEPB Inhale 1 puff into the lungs daily.  08/16/23  Yes Olalere, Adewale A, MD  guaiFENesin-dextromethorphan (ROBITUSSIN DM) 100-10 MG/5ML syrup Take 5 mLs by mouth every 4 (four) hours as needed for cough.   Yes [provider]  hydrOXYzine (VISTARIL) 25 MG capsule TAKE 1 CAPSULE (25 MG TOTAL) BY MOUTH DAILY AS NEEDED. 07/30/23  Yes Zonia Kief, Amy J, NP  ipratropium-albuterol (DUONEB) 0.5-2.5 (3) MG/3ML SOLN Take 3 mLs by nebulization every 4 (four) hours as needed (for shortness of breath). 07/12/22  Yes Laurey Morale, MD  ivabradine (CORLANOR) 7.5 MG TABS tablet Take 1 tablet (7.5 mg total) by mouth 2 (two) times daily with a meal. TAKE 1 TABLET (7.5  MG TOTAL) BY MOUTH 2 (TWO) TIMES DAILY WITH A MEAL. 10/10/22  Yes Bensimhon, Bevelyn Buckles, MD  metolazone (ZAROXOLYN) 2.5 MG tablet Take 1 tablet (2.5 mg total) by mouth once a week. Take every Wednesday with extra 40 mEq of Potassium Patient taking differently: Take 2.5 mg by mouth See admin instructions. Take 2.5mg  by mouth as needed for fluid 06/27/23 09/25/23 Yes [name], Anderson Malta, FNP  nitroGLYCERIN (NITROSTAT) 0.4 MG SL tablet Place 1 tablet (0.4 mg total) under the tongue every 5 (five) minutes as needed for chest pain. 02/14/19  Yes Graciella Freer, PA-C  Nutritional Supplements (ENSURE ORIGINAL) LIQD Take 2-3 Bottles by mouth daily.   Yes [provider]  potassium chloride (KLOR-CON) 10 MEQ tablet Take 4 tablets (40 mEq total) by mouth 2 (two) times daily. Take extra 4 tablets (40 mEq total) with weekly Metolazone dose Patient taking differently: Take 40 mEq by mouth 2 (two) times daily. Take extra 8 tablets (40 mEq total) with Metolazone dose 06/27/23 09/25/23 Yes [name], Anderson Malta, FNP  spironolactone (ALDACTONE) 25 MG tablet Take 1 tablet (25 mg total) by mouth at bedtime. 05/28/23 09/25/23 Yes [name], Anderson Malta, FNP  torsemide (DEMADEX) 20 MG tablet Take 4 tablets (80 mg total) by mouth 2 (two) times daily. Patient taking differently: Take 40-80 mg by mouth See  admin instructions. Take 80mg  by mouth once daily and an additional 40-60mg  as needed for fluid. 06/27/23 09/25/23 Yes Jacklynn Ganong, FNP    Labs on Admission:   CBC: Recent Labs  Lab 09/25/23 0421 09/25/23 0428 09/25/23 0527  WBC 12.8*  --   --   NEUTROABS 9.5*  --   --   HGB 14.5  15.6* 17.0* 16.3*  HCT 47.1*  46.0 50.0* 48.0*  MCV 92.7  --   --   PLT 208  --   --     Basic Metabolic Panel: Recent Labs  Lab 09/25/23 0421 09/25/23 0428 09/25/23 0527  NA 140  137 140 138  K 3.5  3.4* 3.3* 2.7*  CL 98 101  --   CO2 24  --   --   GLUCOSE 379* 380*  --   BUN 22 24*  --   CREATININE 1.66* 1.60*  --   CALCIUM 9.4  --   --   MG 4.5*  --   --     Liver Function Tests: Recent Labs  Lab 09/25/23 0421  AST 40  ALT 21  ALKPHOS 90  BILITOT 1.1  PROT 7.5  ALBUMIN 4.0   No results for input(s): "LIPASE", "AMYLASE" in the last 168 hours. No results for input(s): "AMMONIA" in the last 168 hours.  Cardiac Enzymes: No results for input(s): "CKTOTAL", "CKMB", "CKMBINDEX", "TROPONINI" in the last 168 hours.  BNP (last 3 results) Recent Labs    08/03/23 1733 08/16/23 1458 09/25/23 0421  BNP 2,209.9* 1,342.5* 4,334.8*    ProBNP (last 3 results) No results for input(s): "PROBNP" in the last 8760 hours.  CBG: Recent Labs  Lab 09/25/23 0416 09/25/23 0831  GLUCAP 359* 134*    Lipase     Component Value Date/Time   LIPASE 55 (H) 05/23/2023 1524     Urinalysis    Component Value Date/Time   COLORURINE YELLOW 05/23/2023 1519   APPEARANCEUR CLEAR 05/23/2023 1519   LABSPEC 1.013 05/23/2023 1519   PHURINE 6.0 05/23/2023 1519   GLUCOSEU NEGATIVE 05/23/2023 1519   HGBUR NEGATIVE 05/23/2023 1519   BILIRUBINUR NEGATIVE 05/23/2023 1519  KETONESUR NEGATIVE 05/23/2023 1519   PROTEINUR NEGATIVE 05/23/2023 1519   UROBILINOGEN 1.0 03/15/2015 1117   NITRITE NEGATIVE 05/23/2023 1519   LEUKOCYTESUR NEGATIVE 05/23/2023 1519     Drugs of Abuse     Component  Value Date/Time   LABOPIA POSITIVE (A) 04/20/2023 2325   COCAINSCRNUR NONE DETECTED 04/20/2023 2325   LABBENZ NONE DETECTED 04/20/2023 2325   AMPHETMU NONE DETECTED 04/20/2023 2325   THCU NONE DETECTED 04/20/2023 2325   LABBARB NONE DETECTED 04/20/2023 2325      Radiological Exams on Admission: DG Chest Port 1 View  Result Date: 09/25/2023 CLINICAL DATA:  Dyspnea and respiratory distress. EXAM: PORTABLE CHEST 1 VIEW COMPARISON:  Portable chest 08/03/2023 FINDINGS: 4:12 a.m. The heart is enlarged. 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 Left-sided single lead pacemaker/AID with stable wire insertions. There is mild perihilar vascular congestion mild interstitial edema in the lower lung zones with minimal pleural effusions. Lungs are emphysematous and clear of focal infiltrates. The mediastinum is normally outlined. A right chest battery with single lead extending into the right neck is again noted. No new osseous findings. IMPRESSION: 1. Cardiomegaly with mild perihilar vascular congestion and mild interstitial edema. 2. Emphysema. No focal infiltrates. 3. Minimal pleural effusions are beginning to form. Electronically Signed   By: Almira Bar M.D.   On: 09/25/2023 06:05     Signed, Lorin Glass, MD [facility] 09/25/2023

## 2023-09-25 NOTE — ED Notes (Signed)
ED TO INPATIENT HANDOFF REPORT  ED Nurse Name and Phone #: Lucious Groves [phone]  S Name/Age/Gender Hedda Slade 62 y.o. female Room/Bed: TRACC/TRACC  Code Status   Code Status: Prior  Home/SNF/Other Home Patient oriented to: self, place, time, and situation Is this baseline? Yes   Triage Complete: Triage complete  Chief Complaint COPD  Triage Note Pt arrived via EMS from home. Pt cal;led EMS for shortness of breath all day today. Pt reported she had used her inhaler and nebs all day. EMS reports pt was initially talking but became unresponsive en route. Pt arrived on NRB 15L and spO2 94%. EMS gave patient 2 duo nebs, 2g magnesium, 125mg  solumedrol, and .5mg  epi IM.    Allergies Allergies  Allergen Reactions   Lactose Intolerance (Gi)     Per patient   Codeine Nausea And Vomiting    Level of Care/Admitting Diagnosis ED Disposition     ED Disposition  Admit   Condition  --   Comment  The patient appears reasonably stabilized for admission considering the current resources, flow, and capabilities available in the ED at this time, and I doubt any other [facility] requiring further screening and/or treatment in the ED prior to admission is  present.          B Medical/Surgery History Past Medical History:  Diagnosis Date   Acute respiratory failure (HCC) 05/05/2018   Acute systolic congestive heart failure (HCC) 02/03/2018   AICD (automatic cardioverter/defibrillator) present    Allergy    Anxiety    Asthma    DM2 (diabetes mellitus, type 2) (HCC) 10/20/2020   Hypertension    PAF (paroxysmal atrial fibrillation) (HCC)    Presence of permanent cardiac pacemaker    Prophylactic measure 08/03/14-08/19/14   Prophyl. 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   LIMA to LAD, SVG to D1, SVG to OM1, EVH via right thigh with implantation of Impella LD LVAD via direct aortic approach   Small cell lung cancer (HCC) 03/16/2014   Past Surgical  History:  Procedure Laterality Date   BRONCHIAL BRUSHINGS  10/25/2020   Procedure: BRONCHIAL BRUSHINGS;  Surgeon: Leslye Peer, MD;  Location: [facility] ENDOSCOPY;  Service: Pulmonary;;   BRONCHIAL NEEDLE ASPIRATION BIOPSY  10/25/2020   Procedure: BRONCHIAL NEEDLE ASPIRATION BIOPSIES;  Surgeon: Leslye Peer, MD;  Location: [facility] ENDOSCOPY;  Service: Pulmonary;;   CARDIAC DEFIBRILLATOR PLACEMENT  08/15/2018   MDT Visia AF MRI VR ICD implanted by Dr Georgena Spurling for primary prevention of sudden   CESAREAN SECTION     CORONARY ARTERY BYPASS GRAFT N/A 02/03/2018   Procedure: CORONARY ARTERY BYPASS GRAFTING (CABG);  Surgeon: Purcell Nails, MD;  Location: [facility] OR;  Service: Open Heart Surgery;  Laterality: N/A;  Time 3 using left internal mammary artery and endoscopically harvested right saphenous vein   CORONARY BALLOON ANGIOPLASTY N/A 02/03/2018   Procedure: CORONARY BALLOON ANGIOPLASTY;  Surgeon: Swaziland, Peter M, MD;  Location: [facility] INVASIVE CV LAB;  Service: Cardiovascular;  Laterality: N/A;   CORONARY STENT INTERVENTION N/A 02/12/2019   Procedure: CORONARY STENT INTERVENTION;  Surgeon: Iran Ouch, MD;  Location: [facility] INVASIVE CV LAB;  Service: Cardiovascular;  Laterality: N/A;   CORONARY/GRAFT ACUTE MI REVASCULARIZATION N/A 02/03/2018   Procedure: Coronary/Graft Acute MI Revascularization;  Surgeon: Swaziland, Peter M, MD;  Location: [facility] INVASIVE CV LAB;  Service: Cardiovascular;  Laterality: N/A;   ENDOBRONCHIAL ULTRASOUND N/A 10/25/2020   Procedure: ENDOBRONCHIAL ULTRASOUND;  Surgeon: Leslye Peer, MD;  Location: [facility] ENDOSCOPY;  Service: Pulmonary;  Laterality: N/A;   FLEXIBLE BRONCHOSCOPY  10/25/2020   Procedure: FLEXIBLE BRONCHOSCOPY;  Surgeon: Leslye Peer, MD;  Location: [facility] ENDOSCOPY;  Service: Pulmonary;;   IABP INSERTION N/A 02/03/2018   Procedure: IABP Insertion;  Surgeon: Swaziland, Peter M, MD;  Location: [facility] INVASIVE CV LAB;  Service: Cardiovascular;  Laterality: N/A;   INTRAOPERATIVE  TRANSESOPHAGEAL ECHOCARDIOGRAM N/A 02/03/2018   Procedure: INTRAOPERATIVE TRANSESOPHAGEAL ECHOCARDIOGRAM;  Surgeon: Purcell Nails, MD;  Location: [facility] OR;  Service: Open Heart Surgery;  Laterality: N/A;   LEFT HEART CATH AND CORONARY ANGIOGRAPHY N/A 02/03/2018   Procedure: LEFT HEART CATH AND CORONARY ANGIOGRAPHY;  Surgeon: Swaziland, Peter M, MD;  Location: [facility] INVASIVE CV LAB;  Service: Cardiovascular;  Laterality: N/A;   LEFT HEART CATH AND CORS/GRAFTS ANGIOGRAPHY N/A 02/12/2019   Procedure: LEFT HEART CATH AND CORS/GRAFTS ANGIOGRAPHY;  Surgeon: Iran Ouch, MD;  Location: [facility] INVASIVE CV LAB;  Service: Cardiovascular;  Laterality: N/A;   MEDIASTINOSCOPY N/A 03/11/2014   Procedure: MEDIASTINOSCOPY;  Surgeon: Loreli Slot, MD;  Location: [facility] OR;  Service: Thoracic;  Laterality: N/A;   PLACEMENT OF IMPELLA LEFT VENTRICULAR ASSIST DEVICE  02/03/2018   Procedure: PLACEMENT OF IMPELLA LEFT VENTRICULAR ASSIST DEVICE LD;  Surgeon: Purcell Nails, MD;  Location: [facility] OR;  Service: Open Heart Surgery;;   REMOVAL OF IMPELLA LEFT VENTRICULAR ASSIST DEVICE N/A 02/08/2018   Procedure: REMOVAL OF IMPELLA LEFT VENTRICULAR ASSIST DEVICE;  Surgeon: Purcell Nails, MD;  Location: [facility] OR;  Service: Open Heart Surgery;  Laterality: N/A;   RIGHT HEART CATH N/A 02/03/2018   Procedure: RIGHT HEART CATH;  Surgeon: Swaziland, Peter M, MD;  Location: [facility] INVASIVE CV LAB;  Service: Cardiovascular;  Laterality: N/A;   RIGHT HEART CATH N/A 05/09/2018   Procedure: RIGHT HEART CATH;  Surgeon: Dolores Patty, MD;  Location: [facility] INVASIVE CV LAB;  Service: Cardiovascular;  Laterality: N/A;   TEE WITHOUT CARDIOVERSION N/A 02/08/2018   Procedure: TRANSESOPHAGEAL ECHOCARDIOGRAM (TEE);  Surgeon: Purcell Nails, MD;  Location: [facility] OR;  Service: Open Heart Surgery;  Laterality: N/A;   TUBAL LIGATION     VIDEO BRONCHOSCOPY WITH ENDOBRONCHIAL ULTRASOUND N/A 03/11/2014   Procedure: VIDEO BRONCHOSCOPY WITH ENDOBRONCHIAL ULTRASOUND;   Surgeon: Loreli Slot, MD;  Location: [facility] OR;  Service: Thoracic;  Laterality: N/A;     A IV Location/Drains/Wounds Patient Lines/Drains/Airways Status     Active Line/Drains/Airways     Name Placement date Placement time Site Days   Peripheral IV 09/25/23 22 G Left Hand 09/25/23  0404  Hand  less than 1   Peripheral IV 09/25/23 20 G Right Antecubital 09/25/23  0411  Antecubital  less than 1   External Urinary Catheter 09/25/23  0511  --  less than 1            Intake/Output Last 24 hours No intake or output data in the 24 hours ending 09/25/23 0539  Labs/Imaging Results for orders placed or performed during the hospital encounter of 09/25/23 (from the past 48 hour(s))  Resp panel by RT-PCR (RSV, Flu A&B, Covid) Anterior Nasal Swab     Status: None   Collection Time: 09/25/23  4:11 AM   Specimen: Anterior Nasal Swab  Result Value Ref Range   SARS Coronavirus 2 by RT PCR NEGATIVE NEGATIVE   Influenza A by PCR NEGATIVE NEGATIVE   Influenza B by PCR NEGATIVE NEGATIVE    Comment: (NOTE) The Xpert Xpress SARS-CoV-2/FLU/RSV plus assay is  intended as an aid in the diagnosis of influenza from Nasopharyngeal swab specimens and should not be used as a sole basis for treatment. Nasal washings and aspirates are unacceptable for Xpert Xpress SARS-CoV-2/FLU/RSV testing.  Fact Sheet for Patients: BloggerCourse.com  Fact Sheet for Healthcare Providers: SeriousBroker.it  This test is not yet approved or cleared by the [place] FDA and has been authorized for detection and/or diagnosis of SARS-CoV-2 by FDA under an Emergency Use Authorization (EUA). This EUA will remain in effect (meaning this test can be used) for the duration of the COVID-19 declaration under Section 564(b)(1) of the Act, 21 U.S.C. section 360bbb-3(b)(1), unless the authorization is terminated or revoked.     Resp Syncytial Virus by PCR NEGATIVE  NEGATIVE    Comment: (NOTE) Fact Sheet for Patients: BloggerCourse.com  Fact Sheet for Healthcare Providers: SeriousBroker.it  This test is not yet approved or cleared by the [place] FDA and has been authorized for detection and/or diagnosis of SARS-CoV-2 by FDA under an Emergency Use Authorization (EUA). This EUA will remain in effect (meaning this test can be used) for the duration of the COVID-19 declaration under Section 564(b)(1) of the Act, 21 U.S.C. section 360bbb-3(b)(1), unless the authorization is terminated or revoked.  Performed at [facility], [address], [place], [place] [address]   POC CBG, ED     Status: Abnormal   Collection Time: 09/25/23  4:16 AM  Result Value Ref Range   Glucose-Capillary 359 (H) 70 - 99 mg/dL    Comment: Glucose reference range applies only to samples taken after fasting for at least 8 hours.  Comprehensive metabolic panel     Status: Abnormal   Collection Time: 09/25/23  4:21 AM  Result Value Ref Range   Sodium 140 135 - 145 mmol/L   Potassium 3.5 3.5 - 5.1 mmol/L   Chloride 98 98 - 111 mmol/L   CO2 24 22 - 32 mmol/L   Glucose, Bld 379 (H) 70 - 99 mg/dL    Comment: Glucose reference range applies only to samples taken after fasting for at least 8 hours.   BUN 22 8 - 23 mg/dL   Creatinine, Ser 3.08 (H) 0.44 - 1.00 mg/dL   Calcium 9.4 8.9 - 65.7 mg/dL   Total Protein 7.5 6.5 - 8.1 g/dL   Albumin 4.0 3.5 - 5.0 g/dL   AST 40 15 - 41 U/L   ALT 21 0 - 44 U/L   Alkaline Phosphatase 90 38 - 126 U/L   Total Bilirubin 1.1 0.3 - 1.2 mg/dL   GFR, Estimated 34 (L) >60 mL/min    Comment: (NOTE) Calculated using the CKD-EPI Creatinine Equation (2021)    Anion gap 18 (H) 5 - 15    Comment: Performed at [facility], [address], [place], [place] [address]  CBC with Differential/Platelet     Status: Abnormal   Collection Time: 09/25/23  4:21 AM  Result Value Ref Range    WBC 12.8 (H) 4.0 - 10.5 K/uL   RBC 5.08 3.87 - 5.11 MIL/uL   Hemoglobin 14.5 12.0 - 15.0 g/dL   HCT 29.5 (H) 28.4 - 13.2 %   MCV 92.7 80.0 - 100.0 fL   MCH 28.5 26.0 - 34.0 pg   MCHC 30.8 30.0 - 36.0 g/dL   RDW 44.0 (H) 10.2 - 72.5 %   Platelets 208 150 - 400 K/uL   nRBC 0.2 0.0 - 0.2 %   Neutrophils Relative % 73 %  Neutro Abs 9.5 (H) 1.7 - 7.7 K/uL   Lymphocytes Relative 16 %   Lymphs Abs 2.0 0.7 - 4.0 K/uL   Monocytes Relative 8 %   Monocytes Absolute 1.0 0.1 - 1.0 K/uL   Eosinophils Relative 1 %   Eosinophils Absolute 0.1 0.0 - 0.5 K/uL   Basophils Relative 1 %   Basophils Absolute 0.1 0.0 - 0.1 K/uL   Immature Granulocytes 1 %   Abs Immature Granulocytes 0.12 (H) 0.00 - 0.07 K/uL    Comment: Performed at [facility], [address], [place], [place] [address]  Troponin I (High Sensitivity)     Status: Abnormal   Collection Time: 09/25/23  4:21 AM  Result Value Ref Range   Troponin I (High Sensitivity) 65 (H) <18 ng/L    Comment: (NOTE) Elevated high sensitivity troponin I (hsTnI) values and significant  changes across serial measurements may suggest ACS but many other  chronic and acute conditions are known to elevate hsTnI results.  Refer to the "Links" section for chest pain algorithms and additional  guidance. Performed at [facility], [address], [place], [place] [address]   I-Stat arterial blood gas, ED     Status: Abnormal   Collection Time: 09/25/23  4:21 AM  Result Value Ref Range   pH, Arterial 7.219 (L) 7.35 - 7.45   pCO2 arterial 73.3 (HH) 32 - 48 mmHg   pO2, Arterial 279 (H) 83 - 108 mmHg   Bicarbonate 30.4 (H) 20.0 - 28.0 mmol/L   TCO2 33 (H) 22 - 32 mmol/L   O2 Saturation 100 %   Acid-Base Excess 0.0 0.0 - 2.0 mmol/L   Sodium 137 135 - 145 mmol/L   Potassium 3.4 (L) 3.5 - 5.1 mmol/L   Calcium, Ion 1.18 1.15 - 1.40 mmol/L   HCT 46.0 36.0 - 46.0 %   Hemoglobin 15.6 (H) 12.0 - 15.0 g/dL   Patient temperature 24.4 F    Collection  site RADIAL, ALLEN'S TEST ACCEPTABLE    Drawn by RT    Sample type ARTERIAL    Comment NOTIFIED PHYSICIAN   Magnesium     Status: Abnormal   Collection Time: 09/25/23  4:21 AM  Result Value Ref Range   Magnesium 4.5 (H) 1.7 - 2.4 mg/dL    Comment: Performed at [facility], [address] [place], [place] [address]  I-stat chem 8, ED (not at [facility], [facility] or [facility])     Status: Abnormal   Collection Time: 09/25/23  4:28 AM  Result Value Ref Range   Sodium 140 135 - 145 mmol/L   Potassium 3.3 (L) 3.5 - 5.1 mmol/L   Chloride 101 98 - 111 mmol/L   BUN 24 (H) 8 - 23 mg/dL   Creatinine, Ser 2.53 (H) 0.44 - 1.00 mg/dL   Glucose, Bld 664 (H) 70 - 99 mg/dL    Comment: Glucose reference range applies only to samples taken after fasting for at least 8 hours.   Calcium, Ion 1.12 (L) 1.15 - 1.40 mmol/L   TCO2 26 22 - 32 mmol/L   Hemoglobin 17.0 (H) 12.0 - 15.0 g/dL   HCT 40.3 (H) 47.4 - 25.9 %  I-Stat venous blood gas, ED     Status: Abnormal   Collection Time: 09/25/23  5:27 AM  Result Value Ref Range   pH, Ven 7.372 7.25 - 7.43   pCO2, Ven 51.4 44 - 60 mmHg   pO2, Ven 44 32 -  45 mmHg   Bicarbonate 30.3 (H) 20.0 - 28.0 mmol/L   TCO2 32 22 - 32 mmol/L   O2 Saturation 81 %   Acid-Base Excess 3.0 (H) 0.0 - 2.0 mmol/L   Sodium 138 135 - 145 mmol/L   Potassium 2.7 (LL) 3.5 - 5.1 mmol/L   Calcium, Ion 1.14 (L) 1.15 - 1.40 mmol/L   HCT 48.0 (H) 36.0 - 46.0 %   Hemoglobin 16.3 (H) 12.0 - 15.0 g/dL   Patient temperature 81.1 F    Collection site RADIAL, ALLEN'S TEST ACCEPTABLE    Drawn by RT    Sample type VENOUS    Comment NOTIFIED PHYSICIAN    No results found.  Pending Labs Unresulted Labs (From admission, onward)     Start     Ordered   09/25/23 0421  Digoxin level  Once,   STAT        09/25/23 0420   09/25/23 0412  Brain natriuretic peptide  Once,   URGENT        09/25/23 0413            Vitals/Pain Today's Vitals   09/25/23 0415 09/25/23 0415 09/25/23 0445 09/25/23  0511  BP:   125/78   Pulse: (!) 115  (!) 104   Resp: (!) 24  (!) 25   Temp:  (!) 96.3 F (35.7 C)    TempSrc:  Axillary    SpO2: 100%  97%   PainSc:    0-No pain    Isolation Precautions No active isolations  Medications Medications  ondansetron (ZOFRAN) injection 4 mg (4 mg Intravenous Given by Other 09/25/23 0407)  albuterol (PROVENTIL) (2.5 MG/3ML) 0.083% nebulizer solution (10 mg/hr Nebulization Given 09/25/23 0433)  insulin aspart (novoLOG) injection 7 Units (7 Units Subcutaneous Given 09/25/23 0506)  furosemide (LASIX) injection 40 mg (40 mg Intravenous Given 09/25/23 0507)    Mobility walks     Focused Assessments     R Recommendations: See Admitting Provider Note  Report given to:   Additional Notes:

## 2023-09-25 NOTE — Progress Notes (Signed)
Heart Failure Navigator Progress Note  Assessed for Heart & Vascular TOC clinic readiness.  Patient does not meet criteria due to Advanced Heart Failure Team patient of Dr. Bensimhon.   Navigator will sign off at this time.   Lucilia Yanni, BSN, RN Heart Failure Nurse Navigator Secure Chat Only   

## 2023-09-25 NOTE — ED Triage Notes (Signed)
Pt arrived via EMS from home. Pt cal;led EMS for shortness of breath all day today. Pt reported she had used her inhaler and nebs all day. EMS reports pt was initially talking but became unresponsive en route. Pt arrived on NRB 15L and spO2 94%. EMS gave patient 2 duo nebs, 2g magnesium, 125mg  solumedrol, and .5mg  epi IM.

## 2023-09-25 NOTE — ED Notes (Signed)
Pt tolerating water and applesauce

## 2023-09-25 NOTE — Progress Notes (Signed)
Pt taken off Bipap and placed on 2.5L [place] by RT. Pt tolerating well at this time, Vitals stable, SPO2 98%, No increased WOB noted, RN at bedside, ED MD aware, RT will monitor as needed.     09/25/23 0945  Therapy Vitals  Pulse Rate 73  Resp 15  BP (!) 82/42  MEWS Score/Color  MEWS Score 2  MEWS Score Color Yellow  Respiratory Assessment  Assessment Type Assess only  Respiratory Pattern Regular;Unlabored  Chest Assessment Chest expansion symmetrical  Cough None  Bilateral Breath Sounds Clear;Diminished  Oxygen Therapy/Pulse Ox  O2 Device (S)  Nasal Cannula  O2 Therapy Oxygen  O2 Flow Rate (L/min) 2.5 L/min  SpO2 98 %

## 2023-09-25 NOTE — ED Notes (Signed)
MD Dixon and Rt at bedside on pts arrival.

## 2023-09-25 NOTE — ED Notes (Signed)
Pt taken off BIPAP per provider. Pt reports on 2L [place] at home, placed on 2.5L [place] in ED, tolerating well. PT Aox4, reports breathing feels better than yesterday.

## 2023-09-26 ENCOUNTER — Inpatient Hospital Stay (HOSPITAL_COMMUNITY): Payer: BC Managed Care – PPO

## 2023-09-26 DIAGNOSIS — I5023 Acute on chronic systolic (congestive) heart failure: Secondary | ICD-10-CM | POA: Diagnosis not present

## 2023-09-26 DIAGNOSIS — I10 Essential (primary) hypertension: Secondary | ICD-10-CM | POA: Diagnosis not present

## 2023-09-26 DIAGNOSIS — R9431 Abnormal electrocardiogram [ECG] [EKG]: Secondary | ICD-10-CM

## 2023-09-26 DIAGNOSIS — E43 Unspecified severe protein-calorie malnutrition: Secondary | ICD-10-CM

## 2023-09-26 DIAGNOSIS — E1169 Type 2 diabetes mellitus with other specified complication: Secondary | ICD-10-CM

## 2023-09-26 DIAGNOSIS — I480 Paroxysmal atrial fibrillation: Secondary | ICD-10-CM | POA: Diagnosis not present

## 2023-09-26 DIAGNOSIS — N1832 Chronic kidney disease, stage 3b: Secondary | ICD-10-CM

## 2023-09-26 DIAGNOSIS — C3490 Malignant neoplasm of unspecified part of unspecified bronchus or lung: Secondary | ICD-10-CM

## 2023-09-26 DIAGNOSIS — J441 Chronic obstructive pulmonary disease with (acute) exacerbation: Secondary | ICD-10-CM | POA: Diagnosis not present

## 2023-09-26 DIAGNOSIS — E785 Hyperlipidemia, unspecified: Secondary | ICD-10-CM

## 2023-09-26 LAB — ECHOCARDIOGRAM COMPLETE
AR max vel: 1.86 cm2
AV Area VTI: 1.72 cm2
AV Area mean vel: 1.67 cm2
AV Mean grad: 8 mm[Hg]
AV Peak grad: 14.4 mm[Hg]
Ao pk vel: 1.9 m/s
Area-P 1/2: 5.31 cm2
Height: 59 in
P 1/2 time: 278 ms
S' Lateral: 5.3 cm
Weight: 1559.09 [oz_av]

## 2023-09-26 LAB — BASIC METABOLIC PANEL
Anion gap: 12 (ref 5–15)
BUN: 38 mg/dL — ABNORMAL HIGH (ref 8–23)
CO2: 25 mmol/L (ref 22–32)
Calcium: 9.7 mg/dL (ref 8.9–10.3)
Chloride: 97 mmol/L — ABNORMAL LOW (ref 98–111)
Creatinine, Ser: 1.81 mg/dL — ABNORMAL HIGH (ref 0.44–1.00)
GFR, Estimated: 31 mL/min — ABNORMAL LOW (ref >60)
Glucose, Bld: 272 mg/dL — ABNORMAL HIGH (ref 70–99)
Potassium: 4.2 mmol/L (ref 3.5–5.1)
Sodium: 134 mmol/L — ABNORMAL LOW (ref 135–145)

## 2023-09-26 LAB — GLUCOSE, CAPILLARY
Glucose-Capillary: 194 mg/dL — ABNORMAL HIGH (ref 70–99)
Glucose-Capillary: 161 mg/dL — ABNORMAL HIGH (ref 70–99)
Glucose-Capillary: 122 mg/dL — ABNORMAL HIGH (ref 70–99)
Glucose-Capillary: 352 mg/dL — ABNORMAL HIGH (ref 70–99)

## 2023-09-26 LAB — CBC
HCT: 38.6 % (ref 36.0–46.0)
Hemoglobin: 12.3 g/dL (ref 12.0–15.0)
MCH: 28.3 pg (ref 26.0–34.0)
MCHC: 31.9 g/dL (ref 30.0–36.0)
MCV: 88.9 fL (ref 80.0–100.0)
Platelets: 152 10*3/uL (ref 150–400)
RBC: 4.34 MIL/uL (ref 3.87–5.11)
RDW: 18.7 % — ABNORMAL HIGH (ref 11.5–15.5)
WBC: 14.7 10*3/uL — ABNORMAL HIGH (ref 4.0–10.5)
nRBC: 0 % (ref 0.0–0.2)

## 2023-09-26 LAB — MAGNESIUM: Magnesium: 2.4 mg/dL (ref 1.7–2.4)

## 2023-09-26 LAB — BRAIN NATRIURETIC PEPTIDE: B Natriuretic Peptide: 2664.6 pg/mL — ABNORMAL HIGH (ref 0.0–100.0)

## 2023-09-26 MED ORDER — PANTOPRAZOLE SODIUM 40 MG PO TBEC
40.0000 mg | DELAYED_RELEASE_TABLET | Freq: Every day | ORAL | Status: DC
  Administered 2023-09-26 – 2023-09-29 (×4): 40 mg via ORAL
  Filled 2023-09-26 (×5): qty 1

## 2023-09-26 MED ORDER — IPRATROPIUM-ALBUTEROL 0.5-2.5 (3) MG/3ML IN SOLN: 3.0000 mL | RESPIRATORY_TRACT | Status: DC | PRN

## 2023-09-26 MED ORDER — IPRATROPIUM-ALBUTEROL 0.5-2.5 (3) MG/3ML IN SOLN
3.0000 mL | Freq: Four times a day (QID) | RESPIRATORY_TRACT | Status: AC
  Administered 2023-09-26 – 2023-09-27 (×4): 3 mL via RESPIRATORY_TRACT
  Filled 2023-09-26 (×4): qty 3

## 2023-09-26 MED ORDER — MOMETASONE FURO-FORMOTEROL FUM 100-5 MCG/ACT IN AERO
2.0000 | INHALATION_SPRAY | Freq: Two times a day (BID) | RESPIRATORY_TRACT | Status: DC
  Administered 2023-09-26 – 2023-09-28 (×5): 2 via RESPIRATORY_TRACT
  Filled 2023-09-26: qty 8.8

## 2023-09-26 MED ORDER — METHYLPREDNISOLONE SODIUM SUCC 125 MG IJ SOLR
125.0000 mg | Freq: Once | INTRAMUSCULAR | Status: AC
  Administered 2023-09-26: 125 mg via INTRAVENOUS
  Filled 2023-09-26: qty 2

## 2023-09-26 MED ORDER — CALCIUM CARBONATE ANTACID 500 MG PO CHEW: 200.0000 mg | CHEWABLE_TABLET | Freq: Two times a day (BID) | ORAL | Status: DC | PRN

## 2023-09-26 MED ORDER — METHYLPREDNISOLONE SODIUM SUCC 40 MG IJ SOLR
40.0000 mg | INTRAMUSCULAR | Status: DC
  Administered 2023-09-27 – 2023-09-28 (×2): 40 mg via INTRAVENOUS
  Filled 2023-09-26 (×2): qty 1

## 2023-09-26 MED ORDER — GUAIFENESIN-DM 100-10 MG/5ML PO SYRP
5.0000 mL | ORAL_SOLUTION | Freq: Four times a day (QID) | ORAL | Status: DC
  Administered 2023-09-26 – 2023-09-29 (×12): 5 mL via ORAL
  Filled 2023-09-26 (×12): qty 5

## 2023-09-26 MED ORDER — POTASSIUM CHLORIDE CRYS ER 20 MEQ PO TBCR
40.0000 meq | EXTENDED_RELEASE_TABLET | Freq: Once | ORAL | Status: AC
  Administered 2023-09-26: 40 meq via ORAL
  Filled 2023-09-26: qty 2

## 2023-09-26 MED ORDER — METOLAZONE 2.5 MG PO TABS
2.5000 mg | ORAL_TABLET | Freq: Once | ORAL | Status: AC
  Administered 2023-09-26: 2.5 mg via ORAL
  Filled 2023-09-26: qty 1

## 2023-09-26 MED ORDER — CALCIUM CARBONATE ANTACID 500 MG PO CHEW: 200.0000 mg | CHEWABLE_TABLET | Freq: Two times a day (BID) | ORAL | Status: DC

## 2023-09-26 MED ORDER — GUAIFENESIN-DM 100-10 MG/5ML PO SYRP
5.0000 mL | ORAL_SOLUTION | ORAL | Status: DC | PRN
  Administered 2023-09-26: 5 mL via ORAL
  Filled 2023-09-26: qty 5

## 2023-09-26 MED ORDER — PREDNISONE 20 MG PO TABS: 40.0000 mg | ORAL_TABLET | Freq: Every day | ORAL | Status: DC

## 2023-09-26 NOTE — Assessment & Plan Note (Addendum)
AKI, Hyponatremia. Hypokalemia.   K was corrected.  At the time of her discharge her serum cr is 1,74 with K at 5,0 and serum bicarbonate at 28. Na 131 Mg 2.4  Plan to continue diuresis with metolazone, spironolactone and torsemide.  Follow up renal function as outpatient,

## 2023-09-26 NOTE — Progress Notes (Signed)
No ICM remote transmission received for 09/24/2023 due to currently hospitalized and next ICM transmission scheduled for 10/08/2023.

## 2023-09-26 NOTE — Assessment & Plan Note (Signed)
Continue rate control with digoxin and anticoagulation with apixaban.

## 2023-09-26 NOTE — Assessment & Plan Note (Addendum)
Improved signs of active air trapping.   Plan to continue aggressive bronchodilator therapy scheduled and as needed. Inhaled corticosteroids.  Change from IV methylprednisolone to oral prednisone 20 mg for 3 more days.   Airway clearing techniques with flutter valve and incentive spirometer.  Antitussive agents.

## 2023-09-26 NOTE — Progress Notes (Signed)
Progress Note   Patient: Brittany [name] ZOX:[id number] DOB: [birth year] DOA: 09/25/2023     1 DOS: the patient was seen and examined on 09/26/2023   Brief hospital course: Mrs. [name] was admitted to the hospital with the working diagnosis of heart failure exacerbation.   62 yo female with the past medical history of T2DM, hypertension, dyslipidemia, heart failure, atrial fibrillation, COPD and lung cancer who presented wit dyspnea. Patient has a thoracic impedance monitoring in place and has indicated volume overload over last 3 weeks, consequentially her furosemide has been increased with no much improvement in her symptoms. On the day of admission she had severe dyspnea prompting her to call EMS. Patient was in respiratory distress apparently became unresponsive on route. In the ED she was on non re-breather 15 L with 02 saturation 94%, she was transitioned to Bipap, her RR was 20 to 30, blood pressure 92/57, HR 81, and 02 saturation 99%, lungs with no wheezing or rhonchi, heart with S1 and S2 present and regular, abdomen with no distention and no lower extremity edema.   Chest radiograph with hyperinflation, with cardiomegaly, bilateral hilar vascular congestion with cephalization of the vasculature, bilateral small pleural effusions.   Assessment and Plan: * Acute on chronic systolic CHF (congestive heart failure) (HCC) Echocardiogram with reduced LV systolic function EF 20 to 25%, global hypokinesis, LV with severe dilatation, RV systolic function with mild reduction, RVSP 19,8 LA with moderate dilatation, no LVH,   Documented urine output 1,100 ml Systolic blood pressure 124 to 109 mmHg.   Plan to continue diuresis with furosemide and spironolactone. Continue with digoxin.   Today metolazone 2,5 mg  COPD with acute exacerbation (HCC) Positive signs of active air trapping.   Plan to continue aggressive bronchodilator therapy scheduled and as needed. Systemic and inhaled  corticosteroids. Airway clearing techniques with flutter valve and incentive spirometer.  Antitussive agents.   PAF (paroxysmal atrial fibrillation) (HCC) Continue rate control with digoxin and anticoagulation with apixaban.   Essential hypertension Continue blood pressure monitoring.   CKD stage 3b, GFR 30-44 ml/min (HCC) AKI, Hyponatremia.   Renal function with serum cr at 1,8 with K at 4,2 and serum bicarbonate aet 25. Na 134  Mg 2,4  Plan to continue diuresis with furosemide.  Check renal function and electrolytes in am.   Type 2 diabetes mellitus with hyperlipidemia (HCC) Continue glucose cover and monitoring with insulin sliding scale. Continue with statin therapy.   Protein-calorie malnutrition, severe (HCC) Continue with nutritional supplements.   Small cell lung cancer (HCC) Follow up as outpatient.        Subjective: patient continue to have cough, wheezing and increased sputum production, no chest pain,   Physical Exam: Vitals:   09/26/23 0716 09/26/23 0846 09/26/23 1240 09/26/23 1638  BP: 101/82     Pulse: 84 95 95 89  Resp: 16  20 18   Temp: 98.3 F (36.8 C)     TempSrc: Oral     SpO2: 99%  100% 100%  Weight:      Height:       Neurology awake and alert ENT With mild pallor Cardiovascular with S1 and S2 present and regular with no gallops, rubs or murmurs Respiratory with prolonged expiratory phase with expiratory wheezing and rhonchi, mild rales at bases Abdomen with no distention  No lower extremity edema  Data Reviewed:    Family Communication: no family at the bedside   Disposition: Status is: Inpatient Remains inpatient appropriate because: heart failure and COPD  exacerbation   Planned Discharge Destination: Home     Author: Coralie Keens, MD 09/26/2023 4:42 PM  For on call review [email or web address].

## 2023-09-26 NOTE — TOC Initial Note (Signed)
Transition of Care [facility]) - Initial/Assessment Note    Patient Details  Name: Brittany [name] MRN: [id number] Date of Birth: 1960/12/16  Transition of Care [facility]) CM/SW Contact:    Nicanor Bake Phone Number: [phone] 09/26/2023, 2:07 PM  Clinical Narrative:  HF CSW met with pt at bedside. Pt stated that she lives primarily alone. Pt stated that she stays in a friends home and at a hotel room. Pt stated when her friend is out driving trucks she goes to hotel room so she is close to her sister and the hospital. Pt stated that she has a scale at home. Pt stated that she uses an oxygen machine, a rollator, bi-pap, and nebulizer. Pt stated that she is not interested in a SNF, and if there are any recommendation she would prefer [facility] services.   CSW called pts PCP to cancel pts upcoming appointment for September 27, 2023. Pt did not have an appointment scheduled with PCP, but with the her specialty doctor.    TOC will continue following.               Expected Discharge Plan: Home/Self Care Barriers to Discharge: Continued Medical Work up   Patient Goals and CMS Choice            Expected Discharge Plan and Services       Living arrangements for the past 2 months: Single Family Home, Hotel/Motel                                      Prior Living Arrangements/Services Living arrangements for the past 2 months: Single Family Home, Hotel/Motel Lives with:: Self Patient language and need for interpreter reviewed:: Yes Do you feel safe going back to the place where you live?: Yes      Need for Family Participation in Patient Care: No (Comment) Care giver support system in place?: Yes (comment)   Criminal Activity/Legal Involvement Pertinent to Current Situation/Hospitalization: No - Comment as needed  Activities of Daily Living   ADL Screening (condition at time of admission) Independently performs ADLs?: Yes (appropriate for developmental age) Is the  patient deaf or have difficulty hearing?: No Does the patient have difficulty seeing, even when wearing glasses/contacts?: No Does the patient have difficulty concentrating, remembering, or making decisions?: No  Permission Sought/Granted                  Emotional Assessment Appearance:: Appears older than stated age Attitude/Demeanor/Rapport: Engaged Affect (typically observed): Appropriate Orientation: : Oriented to Self, Oriented to Place, Oriented to  Time, Oriented to Situation Alcohol / Substance Use: Not Applicable Psych Involvement: No (comment)  Admission diagnosis:  Respiratory distress [R06.03] COPD exacerbation (HCC) [J44.1] Acute CHF (congestive heart failure) (HCC) [I50.9] Acute on chronic systolic congestive heart failure (HCC) [I50.23] Acute on chronic respiratory failure with hypoxia and hypercapnia (HCC) [I34.74, J96.22] Patient Active Problem List   Diagnosis Date Noted   Acute CHF (congestive heart failure) (HCC) 09/25/2023   AKI (acute kidney injury) (HCC) 08/04/2023   Acute on chronic respiratory failure (HCC) 08/03/2023   Trichomonas infection 04/19/2023   Presence of heart assist device (HCC) 04/11/2023   COPD with acute exacerbation (HCC) 03/01/2023   CAD (coronary artery disease) 03/01/2023   Acute respiratory failure with hypoxia (HCC) 11/14/2022   Sepsis (HCC) 11/14/2022   Influenza A with pneumonia 11/14/2022   Troponin level elevated 11/14/2022  History of lung cancer 11/14/2022   Acute combined systolic and diastolic CHF, NYHA class 4 (HCC) 07/09/2022   Malnutrition of moderate degree 06/20/2022   Lactic acidosis 04/12/2022   Prolonged QT interval 04/09/2022   Hypotension 04/09/2022   Chronic HFrEF (heart failure with reduced ejection fraction) (HCC) 12/11/2021   CHF exacerbation (HCC) 12/10/2021   Renal infarct (HCC) 12/10/2021   COPD exacerbation (HCC) 09/29/2021   Mixed diabetic hyperlipidemia associated with type 2 diabetes mellitus  (HCC) 09/29/2021   CKD stage 3b, GFR 30-44 ml/min (HCC) 09/29/2021   Unstable angina (HCC) 02/09/2021   Hemoptysis 10/20/2020   Type 2 diabetes mellitus with stage 3a chronic kidney disease, without long-term current use of insulin (HCC) 10/20/2020   Acute on chronic combined systolic and diastolic CHF (congestive heart failure) (HCC) 05/21/2020   Ischemic cardiomyopathy 04/12/2020   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   NSTEMI (non-ST elevated myocardial infarction) (HCC) 02/12/2019   CHF (congestive heart failure) (HCC) 11/24/2018   Recurrent pleural effusion on right 05/05/2018   Acute on chronic respiratory failure with hypoxia (HCC) 05/05/2018   Chronic respiratory failure with hypoxia (HCC)    Asthma    Anxiety    Allergy    HCAP (healthcare-associated pneumonia) 03/15/2015   Chest pain 03/15/2015   Small cell lung cancer (HCC) 03/16/2014   Protein-calorie malnutrition, severe (HCC) 03/14/2014   PAF (paroxysmal atrial fibrillation) (HCC) 03/12/2014   Essential hypertension 05/07/2012   PCP:  Georganna Skeans, MD Pharmacy:   [id number] - [place], [place] - [address] [place] [address] Phone: [phone] Fax: [phone]     Social Determinants of Health (SDOH) Social History: SDOH Screenings   Food Insecurity: No Food Insecurity (09/25/2023)  Housing: Low Risk  (09/25/2023)  Transportation Needs: Unmet Transportation Needs (09/25/2023)  Utilities: Not At Risk (09/25/2023)  Alcohol Screen: Low Risk  (02/11/2021)  Depression (PHQ2-9): Low Risk  (05/30/2023)  Financial Resource Strain: Medium Risk (02/11/2021)  Physical Activity: Insufficiently Active (02/11/2021)  Social Connections: Unknown (02/11/2021)  Stress: Stress Concern Present (02/11/2021)  Tobacco Use: Medium Risk (09/25/2023)   SDOH Interventions:     Readmission Risk Interventions    06/20/2022   10:00 AM 04/11/2021   10:16 AM  Readmission Risk  Prevention Plan  Transportation Screening Complete Complete  PCP or Specialist Appt within 3-5 Days Complete Complete  HRI or Home Care Consult Complete Complete  Social Work Consult for Recovery Care Planning/Counseling Complete Complete  Palliative Care Screening Not Applicable Not Applicable  Medication Review Oceanographer) Complete Complete

## 2023-09-26 NOTE — Hospital Course (Addendum)
[name] was admitted to the hospital with the working diagnosis of heart failure exacerbation, and COPD exacerbation.   62 yo female with the past medical history of T2DM, hypertension, dyslipidemia, heart failure, atrial fibrillation, COPD and lung cancer who presented wit dyspnea. Patient has a thoracic impedance monitoring in place and has indicated volume overload over last 3 weeks, consequentially her furosemide has been increased with no much improvement in her symptoms. On the day of admission she had severe dyspnea prompting her to call EMS. Patient was in respiratory distress apparently became unresponsive on route. In the ED she was on non re-breather 15 L with 02 saturation 94%, she was transitioned to Bipap, her RR was 20 to 30, blood pressure 92/57, HR 81, and 02 saturation 99%, lungs with no wheezing or rhonchi, heart with S1 and S2 present and regular, abdomen with no distention and no lower extremity edema.   ABG 7.21/ 73.3/ 279/ 33/ 100% Na 140, K 3,5 Cl 98, bicarbonate 24, glucose 379, BUN 22, cr 1,66  Mg 4.5  BNP 4,334 High sensitive troponin 65  Wbc 12.8 hgb 15.6 plt 208   Chest radiograph with hyperinflation, with cardiomegaly, bilateral hilar vascular congestion with cephalization of the vasculature, bilateral small pleural effusions.   EKG 113 bpm, right axis deviation, interventricular conduction delay, qtc 513, sinus rhythm with bi atrial enlargement, no significant ST segment or T wave changes.   Patient was placed on IV furosemide for diuresis, aggressive bronchodilator therapy, systemic and inhaled corticosteroids.   10/19 volume status and air trapping have improved. Plan to continue medical therapy at home. Follow up as outpatient.

## 2023-09-26 NOTE — Assessment & Plan Note (Signed)
Continue with nutritional supplements.

## 2023-09-26 NOTE — Assessment & Plan Note (Signed)
Follow up as outpatient.

## 2023-09-26 NOTE — Plan of Care (Signed)
Problem: Education: Goal: Ability to describe self-care measures that may prevent or decrease complications (Diabetes Survival Skills Education) will improve Outcome: Progressing   Problem: Education: Goal: Ability to describe self-care measures that may prevent or decrease complications (Diabetes Survival Skills Education) will improve Outcome: Progressing Goal: Individualized Educational Video(s) Outcome: Progressing   Problem: Coping: Goal: Ability to adjust to condition or change in health will improve Outcome: Progressing   Problem: Fluid Volume: Goal: Ability to maintain a balanced intake and output will improve Outcome: Progressing   Problem: Health Behavior/Discharge Planning: Goal: Ability to identify and utilize available resources and services will improve Outcome: Progressing Goal: Ability to manage health-related needs will improve Outcome: Progressing   Problem: Metabolic: Goal: Ability to maintain appropriate glucose levels will improve Outcome: Progressing   Problem: Nutritional: Goal: Maintenance of adequate nutrition will improve Outcome: Progressing Goal: Progress toward achieving an optimal weight will improve Outcome: Progressing   Problem: Skin Integrity: Goal: Risk for impaired skin integrity will decrease Outcome: Progressing   Problem: Tissue Perfusion: Goal: Adequacy of tissue perfusion will improve Outcome: Progressing   Problem: Education: Goal: Knowledge of General Education information will improve Description: Including pain rating scale, medication(s)/side effects and non-pharmacologic comfort measures Outcome: Progressing   Problem: Health Behavior/Discharge Planning: Goal: Ability to manage health-related needs will improve Outcome: Progressing   Problem: Clinical Measurements: Goal: Ability to maintain clinical measurements within normal limits will improve Outcome: Progressing Goal: Will remain free from infection Outcome:  Progressing Goal: Diagnostic test results will improve Outcome: Progressing Goal: Respiratory complications will improve Outcome: Progressing Goal: Cardiovascular complication will be avoided Outcome: Progressing   Problem: Activity: Goal: Risk for activity intolerance will decrease Outcome: Progressing   Problem: Nutrition: Goal: Adequate nutrition will be maintained Outcome: Progressing   Problem: Coping: Goal: Level of anxiety will decrease Outcome: Progressing   Problem: Elimination: Goal: Will not experience complications related to bowel motility Outcome: Progressing Goal: Will not experience complications related to urinary retention Outcome: Progressing   Problem: Pain Managment: Goal: General experience of comfort will improve Outcome: Progressing   Problem: Safety: Goal: Ability to remain free from injury will improve Outcome: Progressing   Problem: Skin Integrity: Goal: Risk for impaired skin integrity will decrease Outcome: Progressing

## 2023-09-26 NOTE — Assessment & Plan Note (Addendum)
Echocardiogram with reduced LV systolic function EF 20 to 25%, global hypokinesis, LV with severe dilatation, RV systolic function with mild reduction, RVSP 19,8 LA with moderate dilatation, no LVH,   Documented urine output 1,130 ml Systolic blood pressure 106 and 117 mmHg.   Plan to continue diuresis with furosemide and spironolactone. Continue with digoxin.   10/16 metolazone 2,5 mg 10/18 metolazone 5 mg.

## 2023-09-26 NOTE — Progress Notes (Signed)
No bipap needed at this time.PT  is resting on 3l with no distress. Will continue to monitor.

## 2023-09-26 NOTE — Progress Notes (Addendum)
Advanced Heart Failure Rounding Note  PCP-Cardiologist: None   Subjective:    More short of breath this am, O2 up to 5L.   Is/Os not complete. Remains on IV lasix 80 BID. Scr up slightly 1.6>1.8.  Reports whole body is sore from frequent coughing spells.   Objective:   Weight Range: 44.2 kg Body mass index is 19.68 kg/m.   Vital Signs:   Temp:  [97.5 F (36.4 C)-98.6 F (37 C)] 98.3 F (36.8 C) (10/16 0716) Pulse Rate:  [71-95] 95 (10/16 0846) Resp:  [14-20] 16 (10/16 0716) BP: (82-128)/(42-82) 101/82 (10/16 0716) SpO2:  [97 %-100 %] 99 % (10/16 0716) Weight:  [43.4 kg-44.2 kg] 44.2 kg (10/16 0404)    Weight change: Filed Weights   09/25/23 1145 09/26/23 0404  Weight: 43.4 kg 44.2 kg    Intake/Output:   Intake/Output Summary (Last 24 hours) at 09/26/2023 0940 Last data filed at 09/26/2023 0853 Gross per 24 hour  Intake 477 ml  Output 350 ml  Net 127 ml      Physical Exam    General:  Appears fatigued HEENT: Normal Neck: Supple. JVP to jaw. Carotids 2+ bilat; no bruits.  Cor: PMI nondisplaced. Regular rate & rhythm. No rubs, gallops or murmurs. Lungs: tight with decreased air movement Abdomen: Soft, nontender, nondistended.  Extremities: No cyanosis, clubbing, rash, edema Neuro: Alert & orientedx3. Affect anxious.   Telemetry   SR 80s  Labs    CBC Recent Labs    09/25/23 0421 09/25/23 0428 09/25/23 0527 09/26/23 0334  WBC 12.8*  --   --  14.7*  NEUTROABS 9.5*  --   --   --   HGB 14.5  15.6*   < > 16.3* 12.3  HCT 47.1*  46.0   < > 48.0* 38.6  MCV 92.7  --   --  88.9  PLT 208  --   --  152   < > = values in this interval not displayed.   Basic Metabolic Panel Recent Labs    16/10/96 0421 09/25/23 0428 09/25/23 0527 09/26/23 0334  NA 140  137 140 138 134*  K 3.5  3.4* 3.3* 2.7* 4.2  CL 98 101  --  97*  CO2 24  --   --  25  GLUCOSE 379* 380*  --  272*  BUN 22 24*  --  38*  CREATININE 1.66* 1.60*  --  1.81*  CALCIUM  9.4  --   --  9.7  MG 4.5*  --   --  2.4   Liver Function Tests Recent Labs    09/25/23 0421  AST 40  ALT 21  ALKPHOS 90  BILITOT 1.1  PROT 7.5  ALBUMIN 4.0   No results for input(s): "LIPASE", "AMYLASE" in the last 72 hours. Cardiac Enzymes No results for input(s): "CKTOTAL", "CKMB", "CKMBINDEX", "TROPONINI" in the last 72 hours.  BNP: BNP (last 3 results) Recent Labs    08/16/23 1458 09/25/23 0421 09/26/23 0334  BNP 1,342.5* 4,334.8* 2,664.6*    ProBNP (last 3 results) No results for input(s): "PROBNP" in the last 8760 hours.   D-Dimer No results for input(s): "DDIMER" in the last 72 hours. Hemoglobin A1C No results for input(s): "HGBA1C" in the last 72 hours. Fasting Lipid Panel No results for input(s): "CHOL", "HDL", "LDLCALC", "TRIG", "CHOLHDL", "LDLDIRECT" in the last 72 hours. Thyroid Function Tests No results for input(s): "TSH", "T4TOTAL", "T3FREE", "THYROIDAB" in the last 72 hours.  Invalid input(s): "FREET3"  Other  results:   Imaging    No results found.   Medications:     Scheduled Medications:  apixaban  2.5 mg Oral BID   atorvastatin  80 mg Oral Daily   digoxin  125 mcg Oral Daily   feeding supplement  237 mL Oral BID BM   furosemide  80 mg Intravenous BID   insulin aspart  0-5 Units Subcutaneous QHS   insulin aspart  0-9 Units Subcutaneous TID WC   spironolactone  12.5 mg Oral Daily    Infusions:   PRN Medications: acetaminophen **OR** acetaminophen, albuterol, guaiFENesin-dextromethorphan, hydrALAZINE    Patient Profile  [name] is a 62 year old with a history of  PAF,  previous smoker quit 2015, hypertension, 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   Admitted with A/C respiratory failure 2/2 A/C HFrEF   Assessment/Plan   1.  Acute/Chronic Hypoxic Respiratory Failure -Hx frequent admissions for AE COPD + A/C HFrEF.  -Initially on Bipap, now on supplemental O2 [place]. O2 requirement up to 5L.  -Continue diuresis.  -Will discuss with hospitalist. Wonder if need to also treat for AECOPD.    2. A/C HFrEF, ICM -EF has been 20-25% for the last 5 years. -Most recent echo 05/24: EF 20-25%, RV mildly reduced. -S/p Medtronic ICD and Barostim -Presented with NYHA IV functional class.  -Volume overload in the setting of dietary indiscretions. Optivol has suggestive of fluid accumulation for the last 2-3 weeks.  BNP >4000.  -Volume overloaded. Continue Iv lasix 80 BID. Give 2.5 mg metolazone now. -Dig level < 0.2. Continue for now.  -Continue spironolactone 12.5 mg daily  -Hold off on SGLT2i but will restart prior to d/c  -Not a candidate for advanced therapies due to severity of lung disease.    3. CKD Stage IIIb  -Creatinine baseline ~ 1.4-1.6 -Cr 1.8 today  -Follow daily BMET   4. PAF  -Maintaining SR.  -Continue eliquis 2.5 mg twice a day. Reduced dose (weight/creatinine)   5. CAD -History of CABG x2 2019 -2020 DES ostial ramus extending to left main  - HS troponin mildly elevated, flat trend. Doubt ACS. Suspect related to HF.   6. H/O SCLC Completed treatment 2015. CT chest (5/24) soft tissue thickening along mediastinum but no discrete mass    7. H/O Renal Infarct 2022    Length of Stay: 1  FINCH, LINDSAY N, PA-C  09/26/2023, 9:40 AM  Advanced Heart Failure Team Pager [phone] (M-F; 7a - 5p)  Please contact [facility] for night-coverage after hours (5p -7a ) and weekends on [email or web address]   Patient seen and examined with the above-signed Advanced Practice Provider and/or Housestaff. I personally reviewed laboratory data, imaging studies and relevant notes. I independently examined the patient and formulated the important aspects of the plan. I have edited the note to reflect any of my changes or  salient points. I have personally discussed the plan with the patient and/or family.  Much more SOB today. Wheezing. Diuresing modestly with IV lasix and metolazone but drinking a lot of fluids.   General:  Ill appearing.SOB at rest  HEENT: normal Neck: supple. JVP to jaw Carotids 2+ bilat; no bruits. No lymphadenopathy or thryomegaly appreciated. KGM:WNUUVOZ rate & rhythm. No rubs, gallops or murmurs. Lungs: tight + wheezing and rhonchi Abdomen: soft, nontender, nondistended. No  hepatosplenomegaly. No bruits or masses. Good bowel sounds. Extremities: no cyanosis, clubbing, rash,1+ edema Neuro: alert & orientedx3, cranial nerves grossly intact. moves all 4 extremities w/o difficulty. Affect pleasant  Worsening COPD today on top of volume overload.   Continue diuresis. Start steroids and neds. Limit fluid intake.   Arvilla Meres, MD  1:48 PM

## 2023-09-26 NOTE — Assessment & Plan Note (Signed)
Continue glucose cover and monitoring with insulin sliding scale.  Continue with statin therapy.

## 2023-09-26 NOTE — Assessment & Plan Note (Signed)
Continue blood pressure monitoring

## 2023-09-27 ENCOUNTER — Encounter (HOSPITAL_COMMUNITY): Payer: BC Managed Care – PPO

## 2023-09-27 DIAGNOSIS — I5023 Acute on chronic systolic (congestive) heart failure: Secondary | ICD-10-CM | POA: Diagnosis not present

## 2023-09-27 DIAGNOSIS — I10 Essential (primary) hypertension: Secondary | ICD-10-CM | POA: Diagnosis not present

## 2023-09-27 DIAGNOSIS — I480 Paroxysmal atrial fibrillation: Secondary | ICD-10-CM | POA: Diagnosis not present

## 2023-09-27 DIAGNOSIS — J441 Chronic obstructive pulmonary disease with (acute) exacerbation: Secondary | ICD-10-CM | POA: Diagnosis not present

## 2023-09-27 LAB — BASIC METABOLIC PANEL
Anion gap: 14 (ref 5–15)
BUN: 48 mg/dL — ABNORMAL HIGH (ref 8–23)
CO2: 28 mmol/L (ref 22–32)
Calcium: 10.6 mg/dL — ABNORMAL HIGH (ref 8.9–10.3)
Chloride: 94 mmol/L — ABNORMAL LOW (ref 98–111)
Creatinine, Ser: 1.68 mg/dL — ABNORMAL HIGH (ref 0.44–1.00)
GFR, Estimated: 34 mL/min — ABNORMAL LOW (ref >60)
Glucose, Bld: 171 mg/dL — ABNORMAL HIGH (ref 70–99)
Potassium: 4.3 mmol/L (ref 3.5–5.1)
Sodium: 136 mmol/L (ref 135–145)

## 2023-09-27 LAB — GLUCOSE, CAPILLARY
Glucose-Capillary: 270 mg/dL — ABNORMAL HIGH (ref 70–99)
Glucose-Capillary: 276 mg/dL — ABNORMAL HIGH (ref 70–99)
Glucose-Capillary: 337 mg/dL — ABNORMAL HIGH (ref 70–99)
Glucose-Capillary: 144 mg/dL — ABNORMAL HIGH (ref 70–99)

## 2023-09-27 LAB — MAGNESIUM: Magnesium: 2.4 mg/dL (ref 1.7–2.4)

## 2023-09-27 MED ORDER — INSULIN ASPART 100 UNIT/ML IJ SOLN: 0.0000 [IU] | Freq: Every day | INTRAMUSCULAR | Status: DC

## 2023-09-27 MED ORDER — TRAMADOL HCL 50 MG PO TABS
50.0000 mg | ORAL_TABLET | Freq: Four times a day (QID) | ORAL | Status: DC | PRN
  Administered 2023-09-27 – 2023-09-28 (×3): 50 mg via ORAL
  Filled 2023-09-27 (×3): qty 1

## 2023-09-27 MED ORDER — INSULIN GLARGINE-YFGN 100 UNIT/ML ~~LOC~~ SOLN
8.0000 [IU] | Freq: Every day | SUBCUTANEOUS | Status: DC
  Administered 2023-09-27 – 2023-09-29 (×3): 8 [IU] via SUBCUTANEOUS
  Filled 2023-09-27 (×3): qty 0.08

## 2023-09-27 MED ORDER — INSULIN ASPART 100 UNIT/ML IJ SOLN
0.0000 [IU] | Freq: Three times a day (TID) | INTRAMUSCULAR | Status: DC
  Administered 2023-09-27: 11 [IU] via SUBCUTANEOUS
  Administered 2023-09-28: 3 [IU] via SUBCUTANEOUS
  Administered 2023-09-28: 11 [IU] via SUBCUTANEOUS
  Administered 2023-09-28: 5 [IU] via SUBCUTANEOUS
  Administered 2023-09-29: 2 [IU] via SUBCUTANEOUS
  Administered 2023-09-29: 5 [IU] via SUBCUTANEOUS

## 2023-09-27 MED ORDER — ADULT MULTIVITAMIN W/MINERALS CH
1.0000 | ORAL_TABLET | Freq: Every day | ORAL | Status: DC
  Administered 2023-09-27 – 2023-09-29 (×3): 1 via ORAL
  Filled 2023-09-27 (×3): qty 1

## 2023-09-27 NOTE — Progress Notes (Signed)
Progress Note   Patient: Brittany [name] XLK:[id number] DOB: August 26, 1961 DOA: 09/25/2023     2 DOS: the patient was seen and examined on 09/27/2023   Brief hospital course: Brittany [name] was admitted to the hospital with the working diagnosis of heart failure exacerbation.   62 yo female with the past medical history of T2DM, hypertension, dyslipidemia, heart failure, atrial fibrillation, COPD and lung cancer who presented wit dyspnea. Patient has a thoracic impedance monitoring in place and has indicated volume overload over last 3 weeks, consequentially her furosemide has been increased with no much improvement in her symptoms. On the day of admission she had severe dyspnea prompting her to call EMS. Patient was in respiratory distress apparently became unresponsive on route. In the ED she was on non re-breather 15 L with 02 saturation 94%, she was transitioned to Bipap, her RR was 20 to 30, blood pressure 92/57, HR 81, and 02 saturation 99%, lungs with no wheezing or rhonchi, heart with S1 and S2 present and regular, abdomen with no distention and no lower extremity edema.   Chest radiograph with hyperinflation, with cardiomegaly, bilateral hilar vascular congestion with cephalization of the vasculature, bilateral small pleural effusions.   Assessment and Plan: * Acute on chronic systolic CHF (congestive heart failure) (HCC) Echocardiogram with reduced LV systolic function EF 20 to 25%, global hypokinesis, LV with severe dilatation, RV systolic function with mild reduction, RVSP 19,8 LA with moderate dilatation, no LVH,   Documented urine output 2,550 ml Systolic blood pressure 95 and 113 mmHg.   Plan to continue diuresis with furosemide and spironolactone. Continue with digoxin.   10/16 metolazone 2,5 mg  COPD with acute exacerbation (HCC) Positive signs of active air trapping.   Plan to continue aggressive bronchodilator therapy scheduled and as needed. Systemic and inhaled  corticosteroids. Airway clearing techniques with flutter valve and incentive spirometer.  Antitussive agents.   PAF (paroxysmal atrial fibrillation) (HCC) Continue rate control with digoxin and anticoagulation with apixaban.   Essential hypertension Continue blood pressure monitoring.   CKD stage 3b, GFR 30-44 ml/min (HCC) AKI, Hyponatremia.   Improved volume status.  Renal function today with serum cr at 1,68 with K at 4,3 and serum bicarbonate 28. Na 136 and Mg 2.4   Plan to continue diuresis with furosemide.  Check renal function and electrolytes in am.   Type 2 diabetes mellitus with hyperlipidemia (HCC) Continue glucose cover and monitoring with insulin sliding scale. Continue with statin therapy.   Protein-calorie malnutrition, severe (HCC) Continue with nutritional supplements.   Small cell lung cancer (HCC) Follow up as outpatient.        Subjective: Patient is feeling better, dyspnea is improving but not yet back to baseline,   Physical Exam: Vitals:   09/27/23 0747 09/27/23 0829 09/27/23 1116 09/27/23 1200  BP: 95/67  113/72   Pulse: 95  99   Resp: 18  18   Temp:   98.3 F (36.8 C)   TempSrc:   Oral   SpO2:  100% 96%   Weight:    41.9 kg  Height:       Neurology awake and alert ENT with mild pallor Cardiovascular with S1 and S2 present and regular, positive S3 gallop, with no rubs Mild JVD No lower extremity edema Respiratory with improved ventilation, with no wheezing, scattered rales and rhonchi Abdomen with no distention  Data Reviewed:    Family Communication: no family at the bedside   Disposition: Status is: Inpatient Remains inpatient appropriate because:  diuresis and bronchodilator   Planned Discharge Destination: Home     Author: Coralie Keens, MD 09/27/2023 4:43 PM  For on call review [email or web address].

## 2023-09-27 NOTE — Evaluation (Signed)
Physical Therapy Evaluation Patient Details Name: Brittany [name] MRN: [id number] DOB: 07-20-1961 Today's Date: 09/27/2023  History of Present Illness  62 y/o female presents [facility] 09/25/23 with SOB and admitted with acute on chronic systolic CHF. Prior admit 03/01/23 for COPD, chronic hypoxic respiratory failure and chronic combined systolic and diastolic CHF. PMH: PAF, hypertension, previous small cell lung cancer treated with chemo/ chest XRT/ prophylactic brain radiation in 2015, CAD s/p CABG, chronic systolic HF EF ~25%, CKD stage IIIa, prior history of tobacco abuse, a-fib, DBT2,   Clinical Impression  Pt in bed upon arrival and agreeable to PT session with encouragement. Limited eval today due to pt declining to move from laying supine. Prior to admission, pt reports independence with mobility w/ no AD for short distances and rollator when pt has trouble breathing. Pt presents to PT eval below functional mobility baseline with decreased LE strength and decreased functional mobility. Educated pt on the importance of continued mobility while in the hospital with pt agreement. Pt would benefit from acute skilled PT to address functional impairments. Recommending HHPT to work towards independence with mobility. Acute PT to follow.       If plan is discharge home, recommend the following: A little help with walking and/or transfers;A little help with bathing/dressing/bathroom;Assist for transportation     Equipment Recommendations None recommended by PT (will continue to assess)     Functional Status Assessment Patient has had a recent decline in their functional status and demonstrates the ability to make significant improvements in function in a reasonable and predictable amount of time.     Precautions / Restrictions Precautions Precautions: Fall Restrictions Weight Bearing Restrictions: No      Mobility  Bed Mobility  General bed mobility comments: unable to assess bed  mobility or transfers due to pt declining        Balance Overall balance assessment:  (unable to assess, pt declined further mobility)       Pertinent Vitals/Pain Pain Assessment Pain Assessment: Faces Faces Pain Scale: Hurts even more Pain Location: generalized muscle spasms Pain Descriptors / Indicators: Spasm Pain Intervention(s): Limited activity within patient's tolerance, Monitored during session, Repositioned    Home Living Family/patient expects to be discharged to:: Private residence (hotel) Living Arrangements: Alone Available Help at Discharge: Friend(s);Available PRN/intermittently;Family (sister) Type of Home: Other(Comment) (hotel) Home Access: Level entry       Home Layout: One level Home Equipment: Rollator (4 wheels);Grab bars - toilet;Grab bars - tub/shower Additional Comments: Pt states on the weekends she lives with a friend in a house, during the week she lives in a hotel near her sister    Prior Function Prior Level of Function : Independent/Modified Independent;Driving             Mobility Comments: no AD for short distances, rollator if trouble breathing ADLs Comments: independent     Extremity/Trunk Assessment   Upper Extremity Assessment Upper Extremity Assessment: Defer to OT evaluation    Lower Extremity Assessment Lower Extremity Assessment: Generalized weakness (assessed in supine)    Cervical / Trunk Assessment Cervical / Trunk Assessment: Normal  Communication   Communication Communication: No apparent difficulties Cueing Techniques: Verbal cues  Cognition Arousal: Alert Behavior During Therapy: WFL for tasks assessed/performed, Agitated Overall Cognitive Status: Within Functional Limits for tasks assessed            General Comments: limited session due to pt not agreeable to mobility        General Comments General comments (skin  integrity, edema, etc.): VSS on supplemental O2     PT Assessment Patient needs  continued PT services  PT Problem List Decreased strength;Decreased activity tolerance;Decreased balance;Decreased mobility       PT Treatment Interventions DME instruction;Gait training;Functional mobility training;Therapeutic activities;Therapeutic exercise;Balance training;Neuromuscular re-education;Patient/family education    PT Goals (Current goals can be found in the Care Plan section)  Acute Rehab PT Goals Patient Stated Goal: to go home PT Goal Formulation: With patient Time For Goal Achievement: 10/11/23 Potential to Achieve Goals: Good    Frequency Min 1X/week     Co-evaluation PT/OT/SLP Co-Evaluation/Treatment: Yes Reason for Co-Treatment: Necessary to address cognition/behavior during functional activity PT goals addressed during session: Mobility/safety with mobility         AM-PAC PT "6 Clicks" Mobility  Outcome Measure Help needed turning from your back to your side while in a flat bed without using bedrails?: A Little Help needed moving from lying on your back to sitting on the side of a flat bed without using bedrails?: A Little Help needed moving to and from a bed to a chair (including a wheelchair)?: A Little Help needed standing up from a chair using your arms (e.g., wheelchair or bedside chair)?: A Little Help needed to walk in hospital room?: A Little Help needed climbing 3-5 steps with a railing? : A Lot 6 Click Score: 17    End of Session   Activity Tolerance: Patient limited by pain Patient left: in bed;with call bell/phone within reach;with bed alarm set Nurse Communication: Mobility status PT Visit Diagnosis: Unsteadiness on feet (R26.81);Muscle weakness (generalized) (M62.81)    Time: 1006-1030 PT Time Calculation (min) (ACUTE ONLY): 24 min   Charges:   PT Evaluation $PT Eval Low Complexity: 1 Low   PT General Charges $$ ACUTE PT VISIT: 1 Visit        Hilton Cork, PT, DPT Secure Chat Preferred  Rehab Office [phone]   Arturo Morton  Brion Aliment 09/27/2023, 11:35 AM

## 2023-09-27 NOTE — Plan of Care (Signed)
  Problem: Activity: Goal: Risk for activity intolerance will decrease Outcome: Not Progressing   Problem: Fluid Volume: Goal: Ability to maintain a balanced intake and output will improve Outcome: Progressing   Problem: Health Behavior/Discharge Planning: Goal: Ability to manage health-related needs will improve Outcome: Progressing

## 2023-09-27 NOTE — Discharge Instructions (Signed)
High-Calorie, High-Protein Nutrition Therapy (2021) A high-calorie, high-protein diet has been recommended to you. Your registered dietitian nutritionist (RDN) may have recommended this diet because you are having difficulty eating enough calories throughout the day, you have lost weight, and/or you need to add protein to your diet. Sometimes you may not feel like eating, even if you know the importance of good nutrition. The recommendations in this handout can help you with the following: Regaining your strength and energy Keeping your body healthy Healing and recovering from surgery or illness and fighting infection Tips: Schedule Your Meals and Snacks Several small meals and snacks are often better tolerated and digested than large meals. Strategies Plan to eat 3 meals and 3 snacks daily. Experiment with timing meals to find out when you have a larger appetite. Appetite may be greatest in the morning after not eating all night so you may prefer to eat your larger meals and snacks in the morning and at lunch. Breakfast-type foods are often better tolerated so eat foods such as eggs, pancakes, waffles and cereal for any meal or snack. Carry snacks with you so you are prepared to eat every 2 to 3 hours. Determine what works best for you if your body's cues for feeling hungry or full are not working. Eat a small meal or snack even if you don't feel hungry. Set a timer to remind you when it is time to eat. Take a walk before you eat (with health care provider's approval). Light or moderate physical activity can help you maintain muscle and increase your appetite. Make Eating Enjoyable Taking steps to make the experience enjoyable may help to increase your interest in eating and improve your appetite. Strategies: Eat with others whenever possible. Include your favorite foods to make meals more enjoyable. Try new foods. Save your beverage for the end of the meal so that you have more room for  food before you get full. Add Calories to Your Meals and Snacks Try adding calorie-dense foods so that each bite provides more nutrition. Strategies Drink milk, chocolate milk, soy milk, or smoothies instead of low-calorie beverages such as diet drinks or water. Cook with milk or soy milk instead of water when making dishes such as hot cereal, cocoa, or pudding. Add jelly, jam, honey, butter or margarine to bread and crackers. Add jam or fruit to ice cream and as a topping over cake. Mix dried fruit, nuts, granola, honey, or dry cereal with yogurt or hot cereals. Enjoy snacks such as milkshakes, smoothies, pudding, ice cream, or custard. Blend a fruit smoothie of a banana, frozen berries, milk or soy milk, and 1 tablespoon nonfat powdered milk or protein powder. Add Protein to Your Meals and Snacks Choose at least one protein food at each meal and snack to increase your daily intake. Strategies Add  cup nonfat dry milk powder or protein powder to make a high-protein milk to drink or to use in recipes that call for milk. Vanilla or peppermint extract or unsweetened cocoa powder could help to boost the flavor. Add hard-cooked eggs, leftover meat, grated cheese, canned beans or tofu to noodles, rice, salads, sandwiches, soups, casseroles, pasta, tuna and other mixed dishes. Add powdered milk or protein powder to hot cereals, meatloaf, casseroles, scrambled eggs, sauces, cream soups, and shakes. Add beans and lentils to salads, soups, casseroles, and vegetable dishes. Eat cottage cheese or yogurt, especially Greek yogurt, with fruit as a snack or dessert. Eat peanut or other nut butters on crackers, bread, toast,  waffles, apples, bananas or celery sticks. Add it to milkshakes, smoothies, or desserts. Consider a ready-made protein shake. Your RDN will make recommendations. Add Fats to Your Meals and Snacks Try adding fats to your meals and snacks. Fat provides more calories in fewer bites than  carbohydrate or protein and adds flavors to your foods. Strategies Snack on nuts and seeds or add them to foods like salads, pasta, cereals, yogurt, and ice cream.  Saut or stir-fry vegetables, meats, chicken, fish or tofu in olive or canola oil.  Add olive oil, other vegetable oils, butter or margarine to soups, vegetables, potatoes, cooked cereal, rice, pasta, bread, crackers, pancakes, or waffles. Snack on olives or add to pasta, pizza, or salad. Add avocado or guacamole to your salads, sandwiches, and other entrees. Include fatty fish such as salmon in your weekly meal plan. For general food safety tips, especially for clients with immunocompromised conditions, ask your RDN for the Food Safety Nutrition Therapy handout. Small Meal and Snack Ideas These snacks and meals are recommended when you have to eat but aren't necessarily hungry.  They are good choices because they are high in protein and high in calories.  2 graham crackers 2 tablespoons peanut or other nut butter 1 cup milk 2 slices whole wheat toast topped with:  avocado, mashed Seasoning of your choice   cup Greek yogurt  cup fruit  cup granola 2 deviled egg halves 5 whole wheat crackers  1 cup cream of tomato soup  grilled cheese sandwich 1 toasted waffle topped with: 2 tablespoons peanut or nut butter 1 tablespoon jam  Trail mix made with:  cup nuts  cup dried fruit  cup cold cereal, any variety  cup oatmeal or cream of wheat cereal 1 tablespoon peanut or nut butter  cup diced fruit   High-Calorie, High-Protein Sample 1-Day Menu View Nutrient Info Breakfast 1 egg, scrambled 1 ounce cheddar cheese 1 English muffin, whole wheat 1 tablespoon margarine 1 tablespoon jam  cup orange juice, fortified with calcium and vitamin D  Morning Snack 1 tablespoon peanut butter 1 banana 1 cup 1% milk  Lunch Tuna salad sandwich made with: 2 slices bread, whole wheat 3 ounces tuna mixed with: 1 tablespoon  mayonnaise  cup pudding  Afternoon Snack  cup hummus  cup carrots 1 pita  Evening Meal Enchilada casserole made with: 2 corn tortillas 3 ounces ground beef, cooked  cup black beans, cooked  cup corn, cooked 1 ounce grated cheddar cheese  cup enchilada sauce  avocado, sliced, topping for enchilada 1 tablespoon sour cream, topping for enchilada Salad:  cup lettuce, shredded  cup tomatoes, chopped, for salad 1 tablespoon olive oil and vinegar dressing, for salad  Evening Snack  cup [place] yogurt  cup blueberries  cup granola       Low Sodium Nutrition Therapy  Eating less sodium can help you if you have high blood pressure, heart failure, or kidney or liver disease.   Your body needs a little sodium, but too much sodium can cause your body to hold onto extra water. This extra water will raise your blood pressure and can cause damage to your heart, kidneys, or liver as they are forced to work harder.   Sometimes you can see how the extra fluid affects you because your hands, legs, or belly swell. You may also hold water around your heart and lungs, which makes it hard to breathe.   Even if you take medication for blood pressure or a water pill (  diuretic) to remove fluid, it is still important to have less salt in your diet.   Check with your primary care provider before drinking alcohol since it may affect the amount of fluid in your body and how your heart, kidneys, or liver work. Sodium in Food A low-sodium meal plan limits the sodium that you get from food and beverages to 1,500-2,000 milligrams (mg) per day. Salt is the main source of sodium. Read the nutrition label on the package to find out how much sodium is in one serving of a food.  Select foods with 140 milligrams (mg) of sodium or less per serving.  You may be able to eat one or two servings of foods with a little more than 140 milligrams (mg) of sodium if you are closely watching how much sodium you eat in a  day.  Check the serving size on the label. The amount of sodium listed on the label shows the amount in one serving of the food. So, if you eat more than one serving, you will get more sodium than the amount listed.  Tips Cutting Back on Sodium Eat more fresh foods.  Fresh fruits and vegetables are low in sodium, as well as frozen vegetables and fruits that have no added juices or sauces.  Fresh meats are lower in sodium than processed meats, such as bacon, sausage, and hotdogs.  Not all processed foods are unhealthy, but some processed foods may have too much sodium.  Eat less salt at the table and when cooking. One of the ingredients in salt is sodium.  One teaspoon of table salt has 2,300 milligrams of sodium.  Leave the salt out of recipes for pasta, casseroles, and soups. Be a Engineer, building services.  Food packages that say "Salt-free", sodium-free", "very low sodium," and "low sodium" have less than 140 milligrams of sodium per serving.  Beware of products identified as "Unsalted," "No Salt Added," "Reduced Sodium," or "Lower Sodium." These items may still be high in sodium. You should always check the nutrition label. Add flavors to your food without adding sodium.  Try lemon juice, lime juice, or vinegar.  Dry or fresh herbs add flavor.  Buy a sodium-free seasoning blend or make your own at home. You can purchase salt-free or sodium-free condiments like barbeque sauce in stores and online. Ask your registered dietitian nutritionist for recommendations and where to find them.   Eating in Restaurants Choose foods carefully when you eat outside your home. Restaurant foods can be very high in sodium. Many restaurants provide nutrition facts on their menus or their websites. If you cannot find that information, ask your server. Let your server know that you want your food to be cooked without salt and that you would like your salad dressing and sauces to be served on the side.    Foods  Recommended Food Group Foods Recommended  Grains Bread, bagels, rolls without salted tops Homemade bread made with reduced-sodium baking powder Cold cereals, especially shredded wheat and puffed rice Oats, grits, or cream of wheat Pastas, quinoa, and rice Popcorn, pretzels or crackers without salt Corn tortillas  Protein Foods Fresh meats and fish; [place] bacon (check the nutrition labels - make sure they are not packaged in a sodium solution) Canned or packed tuna (no more than 4 ounces at 1 serving) Beans and peas Soybeans) and tofu Eggs Nuts or nut butters without salt  Dairy Milk or milk powder Plant milks, such as rice and soy Yogurt, including Greek yogurt Small  amounts of natural cheese (blocks of cheese) or reduced-sodium cheese can be used in moderation. (Swiss, ricotta, and fresh mozzarella cheese are lower in sodium than the others) Cream Cheese Low sodium cottage cheese  Vegetables Fresh and frozen vegetables without added sauces or salt Homemade soups (without salt) Low-sodium, salt-free or sodium-free canned vegetables and soups  Fruit Fresh and canned fruits Dried fruits, such as raisins, cranberries, and prunes  Oils Tub or liquid margarine, regular or without salt Canola, corn, peanut, olive, safflower, or sunflower oils  Condiments Fresh or dried herbs such as basil, bay leaf, dill, mustard (dry), nutmeg, paprika, parsley, rosemary, sage, or thyme.  Low sodium ketchup Vinegar  Lemon or lime juice Pepper, red pepper flakes, and cayenne. Hot sauce contains sodium, but if you use just a drop or two, it will not add up to much.  Salt-free or sodium-free seasoning mixes and marinades Simple salad dressings: vinegar and oil   Foods Not Recommended Food Group Foods Not Recommended  Grains Breads or crackers topped with salt Cereals (hot/cold) with more than 300 mg sodium per serving Biscuits, cornbread, and other "quick" breads prepared with baking  soda Pre-packaged bread crumbs Seasoned and packaged rice and pasta mixes Self-rising flours  Protein Foods Cured meats: Bacon, ham, sausage, pepperoni and hot dogs Canned meats (chili, vienna sausage, or sardines) Smoked fish and meats Frozen meals that have more than 600 mg of sodium per serving Egg substitute (with added sodium)  Dairy Buttermilk Processed cheese spreads Cottage cheese (1 cup may have over 500 mg of sodium; look for low-sodium.) American or feta cheese Shredded Cheese has more sodium than blocks of cheese String cheese  Vegetables Canned vegetables (unless they are salt-free, sodium-free or low sodium) Frozen vegetables with seasoning and sauces Sauerkraut and pickled vegetables Canned or dried soups (unless they are salt-free, sodium-free, or low sodium) [place] fries and onion rings  Fruit Dried fruits preserved with additives that have sodium  Oils Salted butter or margarine, all types of olives  Condiments Salt, sea salt, kosher salt, onion salt, and garlic salt Seasoning mixes with salt Bouillon cubes Ketchup Barbeque sauce and Worcestershire sauce unless low sodium Soy sauce Salsa, pickles, olives, relish Salad dressings: ranch, blue cheese, [place], and [place].   Low Sodium Sample 1-Day Menu  Breakfast 1 cup cooked oatmeal  1 slice whole wheat bread toast  1 tablespoon peanut butter without salt  1 banana  1 cup 1% milk  Lunch Tacos made with: 2 corn tortillas   cup black beans, low sodium   cup roasted or grilled chicken (without skin)   avocado  Squeeze of lime juice  1 cup salad greens  1 tablespoon low-sodium salad dressing   cup strawberries  1 orange  Afternoon Snack 1/3 cup grapes  6 ounces yogurt  Evening Meal 3 ounces herb-baked fish  1 baked potato  2 teaspoons olive oil   cup cooked carrots  2 thick slices tomatoes on:  2 lettuce leaves  1 teaspoon olive oil  1 teaspoon balsamic vinegar  1 cup 1% milk  Evening Snack 1  apple   cup almonds without salt   Low-Sodium Vegetarian (Lacto-Ovo) Sample 1-Day Menu  Breakfast 1 cup cooked oatmeal  1 slice whole wheat toast  1 tablespoon peanut butter without salt  1 banana  1 cup 1% milk  Lunch Tacos made with: 2 corn tortillas   cup black beans, low sodium   cup roasted or grilled chicken (without skin)  avocado  Squeeze of lime juice  1 cup salad greens  1 tablespoon low-sodium salad dressing   cup strawberries  1 orange  Evening Meal Stir fry made with:  cup tofu  1 cup brown rice   cup broccoli   cup green beans   cup peppers   tablespoon peanut oil  1 orange  1 cup 1% milk  Evening Snack 4 strips celery  2 tablespoons hummus  1 hard-boiled egg   Low-Sodium Vegan Sample 1-Day Menu  Breakfast 1 cup cooked oatmeal  1 tablespoon peanut butter without salt  1 cup blueberries  1 cup soymilk fortified with calcium, vitamin B12, and vitamin D  Lunch 1 small whole wheat pita   cup cooked lentils  2 tablespoons hummus  4 carrot sticks  1 medium apple  1 cup soymilk fortified with calcium, vitamin B12, and vitamin D  Evening Meal Stir fry made with:  cup tofu  1 cup brown rice   cup broccoli   cup green beans   cup peppers   tablespoon peanut oil  1 cup cantaloupe  Evening Snack 1 cup soy yogurt   cup mixed nuts  Copyright 2020  [employer]y [employer]. All rights reserved  Sodium Free Flavoring Tips  When cooking, the following items may be used for flavoring instead of salt or seasonings that contain sodium. Remember: A little bit of spice goes a long way! Be careful not to overseason. Spice Blend Recipe (makes about ? cup) 5 teaspoons onion powder  2 teaspoons garlic powder  2 teaspoons paprika  2 teaspoon dry mustard  1 teaspoon crushed thyme leaves   teaspoon white pepper   teaspoon celery seed Food Item Flavorings  Beef Basil, bay leaf, caraway, curry, dill, dry mustard, garlic, grape jelly,  green pepper, mace, marjoram, mushrooms (fresh), nutmeg, onion or onion powder, parsley, pepper, rosemary, sage  Chicken Basil, cloves, cranberries, mace, mushrooms (fresh), nutmeg, oregano, paprika, parsley, pineapple, saffron, sage, savory, tarragon, thyme, tomato, turmeric  Egg Chervil, curry, dill, dry mustard, garlic or garlic powder, green pepper, jelly, mushrooms (fresh), nutmeg, onion powder, paprika, parsley, rosemary, tarragon, tomato  Fish Basil, bay leaf, chervil, curry, dill, dry mustard, green pepper, lemon juice, marjoram, mushrooms (fresh), paprika, pepper, tarragon, tomato, turmeric  Lamb Cloves, curry, dill, garlic or garlic powder, mace, mint, mint jelly, onion, oregano, parsley, pineapple, rosemary, tarragon, thyme  Pork Applesauce, basil, caraway, chives, cloves, garlic or garlic powder, onion or onion powder, rosemary, thyme  Veal Apricots, basil, bay leaf, currant jelly, curry, ginger, marjoram, mushrooms (fresh), oregano, paprika  Vegetables Basil, dill, garlic or garlic powder, ginger, lemon juice, mace, marjoram, nutmeg, onion or onion powder, tarragon, tomato, sugar or sugar substitute, salt-free salad dressing, vinegar  Desserts Allspice, anise, cinnamon, cloves, ginger, mace, nutmeg, vanilla extract, other extracts   Copyright 2020  Academy [employer]. All rights reserved  Fluid Restricted Nutrition Therapy  You have been prescribed this diet because your condition affects how much fluid you can eat or drink. If your heart, liver, or kidneys aren't working properly, you may not be able to effectively eliminate fluids from the body and this may cause swelling (edema) in the legs, arms, and/or stomach. Drink no more than _________ liters or ________ ounces or ________cups of fluid per day.  You don't need to stop eating or drinking the same fluids you normally would, but you may need to eat or drink less than usual.  Your registered dietitian nutritionist  will help you determine the correct amount of fluid to consume during the day Breakfast Include fluids taken with medications  Lunch Include fluids taken with medications  Dinner Include fluids taken with medications  Bedtime Snack Include fluids taken with medications     Tips What Are Fluids?  A fluid is anything that is liquid or anything that would melt if left at room temperature. You will need to count these foods and liquids--including any liquid used to take medication--as part of your daily fluid intake. Some examples are: Alcohol (drink only with your doctor's permission)  Coffee, tea, and other hot beverages  Gelatin (Jell-O)  Gravy  Ice cream, sherbet, sorbet  Ice cubes, ice chips  Milk, liquid creamer  Nutritional supplements  Popsicles  Vegetable and fruit juices; fluid in canned fruit  Watermelon  Yogurt  Soft drinks, lemonade, limeade  Soups  Syrup How Do I Measure My Fluid Intake? Record your fluid intake daily.  Tip: Every day, each time you eat or drink fluids, pour water in the same amount into an empty container that can hold the same amount of fluids you are allowed daily. This may help you keep track of how much fluid you are taking in throughout the day.  To accurately keep track of how much liquid you take in, measure the size of the cups, glasses, and bowls you use. If you eat soup, measure how much of it is liquid and how much is solid (such as noodles, vegetables, meat). Conversions for Measuring Fluid Intake  Milliliters (mL) Liters (L) Ounces (oz) Cups (c)  1000 1 32 4  1200 1.2 40 5  1500 1.5 50 6 1/4  1800 1.8 60 7 1/2  2000 2 67 8 1/3  Tips to Reduce Your Thirst Chew gum or suck on hard candy.  Rinse or gargle with mouthwash. Do not swallow.  Ice chips or popsicles my help quench thirst, but this too needs to be calculated into the total restriction. Melt ice chips or cubes first to figure out how much fluid they produce (for example,  experiment with melting  cup ice chips or 2 ice cubes).  Add a lemon wedge to your water.  Limit how much salt you take in. A high salt intake might make you thirstier.  Don't eat or drink all your allowed liquids at once. Space your liquids out through the day.  Use small glasses and cups and sip slowly. If allowed, take your medications with fluids you eat or drink during a meal.   Fluid-Restricted Nutrition Therapy Sample 1-Day Menu  Breakfast 1 slice wheat toast  1 tablespoon peanut butter  1/2 cup yogurt (120 milliliters)  1/2 cup blueberries  1 cup milk (240 milliliters)   Lunch 3 ounces sliced [place]  2 slices whole wheat bread  1/2 cup lettuce for sandwich  2 slices tomato for sandwich  1 ounce reduced-fat, reduced-sodium cheese  1/2 cup fresh carrot sticks  1 banana  1 cup unsweetened tea (240 milliliters)   Evening Meal 8 ounces soup (240 milliliters)  3 ounces salmon  1/2 cup quinoa  1 cup green beans  1 cup mixed greens salad  1 tablespoon olive oil  1 cup coffee (240 milliliters)  Evening Snack 1/2 cup sliced peaches  1/2 cup frozen yogurt (120 milliliters)  1 cup water (240 milliliters)  Copyright 2020  [employer]y [employer]. All rights reserved

## 2023-09-27 NOTE — Progress Notes (Signed)
Discussed with NP that patient stated she will walk to morrow.  Mobility and RN encouraged patient but she refused.

## 2023-09-27 NOTE — Progress Notes (Addendum)
Advanced Heart Failure Rounding Note  PCP-Cardiologist: None   Subjective:   Yesterday diuresed with IV lasix and started on nebs/steroids. Negative 1.3 liters.   Feeling a little better.    Objective:   Weight Range: 43.4 kg Body mass index is 19.32 kg/m.   Vital Signs:   Temp:  [98.2 F (36.8 C)-98.3 F (36.8 C)] 98.3 F (36.8 C) (10/17 1116) Pulse Rate:  [89-100] 99 (10/17 1116) Resp:  [16-20] 18 (10/17 1116) BP: (95-124)/(60-84) 113/72 (10/17 1116) SpO2:  [96 %-100 %] 96 % (10/17 1116) FiO2 (%):  [28 %-32 %] 28 % (10/16 1638) Weight:  [43.4 kg] 43.4 kg (10/17 0341) Last BM Date : 09/24/23  Weight change: Filed Weights   09/25/23 1145 09/26/23 0404 09/27/23 0341  Weight: 43.4 kg 44.2 kg 43.4 kg    Intake/Output:   Intake/Output Summary (Last 24 hours) at 09/27/2023 1138 Last data filed at 09/27/2023 0845 Gross per 24 hour  Intake 714 ml  Output 2150 ml  Net -1436 ml      Physical Exam   General:  No resp difficulty HEENT: normal Neck: supple. JVP 9-10  Carotids 2+ bilat; no bruits. No lymphadenopathy or thryomegaly appreciated. Cor: PMI nondisplaced. Regular rate & rhythm. No rubs, gallops or murmurs. Lungs: clear. No wheeze.  Abdomen: soft, nontender, nondistended. No hepatosplenomegaly. No bruits or masses. Good bowel sounds. Extremities: no cyanosis, clubbing, rash, edema Neuro: alert & orientedx3, cranial nerves grossly intact. moves all 4 extremities w/o difficulty. Affect pleasant  Telemetry   SR 80s   Labs    CBC Recent Labs    09/25/23 0421 09/25/23 0428 09/25/23 0527 09/26/23 0334  WBC 12.8*  --   --  14.7*  NEUTROABS 9.5*  --   --   --   HGB 14.5  15.6*   < > 16.3* 12.3  HCT 47.1*  46.0   < > 48.0* 38.6  MCV 92.7  --   --  88.9  PLT 208  --   --  152   < > = values in this interval not displayed.   Basic Metabolic Panel Recent Labs    56/38/75 0334 09/27/23 0249  NA 134* 136  K 4.2 4.3  CL 97* 94*  CO2 25 28   GLUCOSE 272* 171*  BUN 38* 48*  CREATININE 1.81* 1.68*  CALCIUM 9.7 10.6*  MG 2.4 2.4   Liver Function Tests Recent Labs    09/25/23 0421  AST 40  ALT 21  ALKPHOS 90  BILITOT 1.1  PROT 7.5  ALBUMIN 4.0   No results for input(s): "LIPASE", "AMYLASE" in the last 72 hours. Cardiac Enzymes No results for input(s): "CKTOTAL", "CKMB", "CKMBINDEX", "TROPONINI" in the last 72 hours.  BNP: BNP (last 3 results) Recent Labs    08/16/23 1458 09/25/23 0421 09/26/23 0334  BNP 1,342.5* 4,334.8* 2,664.6*    ProBNP (last 3 results) No results for input(s): "PROBNP" in the last 8760 hours.   D-Dimer No results for input(s): "DDIMER" in the last 72 hours. Hemoglobin A1C No results for input(s): "HGBA1C" in the last 72 hours. Fasting Lipid Panel No results for input(s): "CHOL", "HDL", "LDLCALC", "TRIG", "CHOLHDL", "LDLDIRECT" in the last 72 hours. Thyroid Function Tests No results for input(s): "TSH", "T4TOTAL", "T3FREE", "THYROIDAB" in the last 72 hours.  Invalid input(s): "FREET3"  Other results:   Imaging    ECHOCARDIOGRAM COMPLETE  Result Date: 09/26/2023    ECHOCARDIOGRAM REPORT   Patient Name:   Brittany [name]  Date of Exam: 09/26/2023 Medical Rec #:  [id number]               Height:       59.0 in Accession #:    [id number]              Weight:       97.4 lb Date of Birth:  26-May-1961               BSA:          1.359 m Patient Age:    62 years                BP:           101/82 mmHg Patient Gender: F                       HR:           80 bpm. Exam Location:  Inpatient Procedure: 2D Echo, Color Doppler and Cardiac Doppler Indications:    Abnormal ECG  History:        Patient has prior history of Echocardiogram examinations, most                 recent 04/20/2023. CHF and Cardiomyopathy, Previous Myocardial                 Infarction, Defibrillator, CKD, lung cancer and COPD,                 Arrythmias:Atrial Fibrillation; Risk Factors:Diabetes and                  Dyslipidemia.  Sonographer:    Milbert Coulter Referring Phys: [id number] BINAYA DAHAL IMPRESSIONS  1. Left ventricular ejection fraction, by estimation, is 20 to 25%. The left ventricle has severely decreased function. The left ventricle demonstrates global hypokinesis. The left ventricular internal cavity size was moderately dilated. Left ventricular diastolic parameters are consistent with Grade II diastolic dysfunction (pseudonormalization).  2. Right ventricular systolic function is mildly reduced. The right ventricular size is normal. There is mildly elevated pulmonary artery systolic pressure. The estimated right ventricular systolic pressure is 36.6 mmHg.  3. Left atrial size was mildly dilated.  4. The mitral valve is grossly normal. Mild mitral valve regurgitation. No evidence of mitral stenosis.  5. The aortic valve was not well visualized. Aortic valve regurgitation is moderate to severe. No aortic stenosis is present.  6. The inferior vena cava is normal in size with greater than 50% respiratory variability, suggesting right atrial pressure of 3 mmHg. Comparison(s): No significant change from prior study. FINDINGS  Left Ventricle: Left ventricular ejection fraction, by estimation, is 20 to 25%. The left ventricle has severely decreased function. The left ventricle demonstrates global hypokinesis. The left ventricular internal cavity size was moderately dilated. There is no left ventricular hypertrophy. Left ventricular diastolic parameters are consistent with Grade II diastolic dysfunction (pseudonormalization). Right Ventricle: The right ventricular size is normal. No increase in right ventricular wall thickness. Right ventricular systolic function is mildly reduced. There is mildly elevated pulmonary artery systolic pressure. The tricuspid regurgitant velocity  is 2.90 m/s, and with an assumed right atrial pressure of 3 mmHg, the estimated right ventricular systolic pressure is 36.6 mmHg. Left Atrium: Left  atrial size was mildly dilated. Right Atrium: Right atrial size was normal in size. Pericardium: There is no evidence of pericardial effusion. Mitral Valve: The mitral valve is grossly normal. Mild mitral valve regurgitation.  No evidence of mitral valve stenosis. Tricuspid Valve: The tricuspid valve is grossly normal. Tricuspid valve regurgitation is mild . No evidence of tricuspid stenosis. Aortic Valve: The aortic valve was not well visualized. Aortic valve regurgitation is moderate to severe. Aortic regurgitation PHT measures 278 msec. No aortic stenosis is present. Aortic valve mean gradient measures 8.0 mmHg. Aortic valve peak gradient measures 14.4 mmHg. Aortic valve area, by VTI measures 1.72 cm. Pulmonic Valve: The pulmonic valve was grossly normal. Pulmonic valve regurgitation is not visualized. No evidence of pulmonic stenosis. Aorta: The aortic root is normal in size and structure. Venous: The inferior vena cava is normal in size with greater than 50% respiratory variability, suggesting right atrial pressure of 3 mmHg. IAS/Shunts: The atrial septum is grossly normal. Additional Comments: A device lead is visualized in the right atrium and right ventricle.  LEFT VENTRICLE PLAX 2D LVIDd:         6.20 cm   Diastology LVIDs:         5.30 cm   LV e' lateral:   11.30 cm/s LV PW:         1.00 cm   LV E/e' lateral: 7.4 LV IVS:        0.70 cm LVOT diam:     1.70 cm LV SV:         56 LV SV Index:   41 LVOT Area:     2.27 cm  RIGHT VENTRICLE RV Basal diam:  3.50 cm RV Mid diam:    2.60 cm RV S prime:     8.70 cm/s TAPSE (M-mode): 1.5 cm LEFT ATRIUM             Index        RIGHT ATRIUM          Index LA diam:        4.50 cm 3.31 cm/m   RA Area:     8.95 cm LA Vol (A2C):   47.5 ml 34.96 ml/m  RA Volume:   19.00 ml 13.98 ml/m LA Vol (A4C):   46.8 ml 34.44 ml/m LA Biplane Vol: 47.7 ml 35.11 ml/m  AORTIC VALVE AV Area (Vmax):    1.86 cm AV Area (Vmean):   1.67 cm AV Area (VTI):     1.72 cm AV Vmax:            190.00 cm/s AV Vmean:          131.000 cm/s AV VTI:            0.324 m AV Peak Grad:      14.4 mmHg AV Mean Grad:      8.0 mmHg LVOT Vmax:         156.00 cm/s LVOT Vmean:        96.500 cm/s LVOT VTI:          0.246 m LVOT/AV VTI ratio: 0.76 AI PHT:            278 msec  AORTA Ao Root diam: 2.60 cm MITRAL VALVE               TRICUSPID VALVE MV Area (PHT): 5.31 cm    TR Peak grad:   33.6 mmHg MV Decel Time: 143 msec    TR Vmax:        290.00 cm/s MV E velocity: 83.30 cm/s MV A velocity: 60.80 cm/s  SHUNTS MV E/A ratio:  1.37        Systemic VTI:  0.25  m                            Systemic Diam: 1.70 cm Lennie Odor MD Electronically signed by Lennie Odor MD Signature Date/Time: 09/26/2023/5:28:55 PM    Final      Medications:     Scheduled Medications:  apixaban  2.5 mg Oral BID   atorvastatin  80 mg Oral Daily   digoxin  125 mcg Oral Daily   feeding supplement  237 mL Oral BID BM   furosemide  80 mg Intravenous BID   guaiFENesin-dextromethorphan  5 mL Oral QID   insulin aspart  0-5 Units Subcutaneous QHS   insulin aspart  0-9 Units Subcutaneous TID WC   methylPREDNISolone (SOLU-MEDROL) injection  40 mg Intravenous Q24H   mometasone-formoterol  2 puff Inhalation BID   pantoprazole  40 mg Oral Daily   spironolactone  12.5 mg Oral Daily    Infusions:   PRN Medications: acetaminophen **OR** acetaminophen, calcium carbonate, hydrALAZINE, ipratropium-albuterol, traMADol    Patient Profile  Brittany [name] is a 62 year old with a history of  PAF,  previous smoker quit 2015, hypertension, 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   Admitted with A/C respiratory failure 2/2 A/C HFrEF   Assessment/Plan   1. Acute/Chronic Hypoxic Respiratory Failure/ AECOPD  -Hx frequent admissions for AE COPD + A/C HFrEF.   -Initially on Bipap, now on supplemental O2 [place]. O2 requirement up to 5L.  -Continue IV diuresis today.  -OOB  - ON steroids and nebs. No wheeze today.    2. A/C HFrEF, ICM -EF has been 20-25% for the last 5 years. -Most recent echo 05/24: EF 20-25%, RV mildly reduced. -S/p [employer] ICD and Barostim -Presented with NYHA IV functional class.  -Volume overload in the setting of dietary indiscretions. Optivol has suggestive of fluid accumulation for the last 2-3 weeks.  BNP >4000.  -Volume status improving. Continue IV lasix. Check Reds Clip today.  Anticipate putting her back on torsemide 80 mg twice a day.  -Dig level < 0.2. Continue for now.  -Continue spironolactone 12.5 mg daily  -Hold off on SGLT2i but will restart prior to d/c  -Not a candidate for advanced therapies due to severity of lung disease.    3. CKD Stage IIIb  -Creatinine baseline ~ 1.4-1.6 -Cr stable 1.7  today.   -Follow daily BMET   4. PAF  -Maintaining SR.  -Continue eliquis 2.5 mg twice a day. Reduced dose (weight/creatinine)   5. CAD -History of CABG x2 2019 -2020 DES ostial ramus extending to left main  - HS troponin mildly elevated, flat trend. Doubt ACS. Suspect related to HF. - No chest pain.    6. H/O SCLC Completed treatment 2015. CT chest (5/24) soft tissue thickening along mediastinum but no discrete mass    7. H/O Renal Infarct 2022  8. DNR     Length of Stay: 2  Tonye Becket, NP  09/27/2023, 11:38 AM  Advanced Heart Failure Team Pager [phone] (M-F; 7a - 5p)  Please contact [facility] for night-coverage after hours (5p -7a ) and weekends on [email or web address]  Patient seen and examined with the above-signed Advanced Practice Provider and/or Housestaff. I personally reviewed laboratory data, imaging studies and relevant notes. I independently examined  the patient and formulated the important aspects of the plan. I have edited the note to reflect any of my changes or salient points. I have  personally discussed the plan with the patient and/or family.  Remains on IV lasix. Steroids and nebs started yesterday for concomitant COPD flare.   Feels a bit better today but still SOB.   Diuresing modestly.   General:  Sitting up in bed . No resp difficulty HEENT: normal Neck: supple. JVP 9-10 Cor: PMI nondisplaced. Regular rate & rhythm. No rubs, gallops or murmurs. Lungs: + crackles Abdomen: soft, nontender, nondistended. No hepatosplenomegaly. No bruits or masses. Good bowel sounds. Extremities: no cyanosis, clubbing, rash, edema Neuro: alert & orientedx3, cranial nerves grossly intact. moves all 4 extremities w/o difficulty. Affect pleasant  Continue IV diuresis at least one more day. Continue treatment of COPD flare.   Arvilla Meres, MD  5:21 PM

## 2023-09-27 NOTE — Progress Notes (Signed)
Patient not in any distress, bipap is prn.   09/27/23 0123  BiPAP/CPAP/SIPAP  Reason BIPAP/CPAP not in use Other(comment) (prn)

## 2023-09-27 NOTE — Progress Notes (Signed)
Initial Nutrition Assessment  DOCUMENTATION CODES:   Non-severe (moderate) malnutrition in context of chronic illness  INTERVENTION:  Liberalize diet to carb modified with 2L fluid restriction Ensure Enlive po BID, each supplement provides 350 kcal and 20 grams of protein. MVI with minerals daily "High Calorie, High Protein Nutrition Therapy" and "Low Sodium Nutrition Therapy" handout added to AVS  NUTRITION DIAGNOSIS:   Moderate Malnutrition related to chronic illness (COPD, CKD, HF,) as evidenced by mild fat depletion, mild muscle depletion, severe muscle depletion.  GOAL:   Patient will meet greater than or equal to 90% of their needs  MONITOR:   PO intake, Supplement acceptance, Labs, Weight trends, I & O's  REASON FOR ASSESSMENT:   Consult Assessment of nutrition requirement/status  ASSESSMENT:   Pt admitted with heart failure exacerbation. PMH significant for T2DM, CKD stage IIIb, HTN, dyslipidemia, HF, afib, COPD and lung cancer.  Pt attempting to sleep but awoke easily and was in good spirits. She endorses feeling better today however reports an ongoing limited appetite. Pt admits to smoking marijuana to aid in appetite which began when she was diagnosed with lung cancer.   On average she does not eat breakfast. Her first meal is around 1pm and then again around dinner time. She usually eats grilled chicken salads, fish prepared in the air fryer and vegetables. D/t limited appetite she will drink about 2 Ensure daily to augment her intake.   Pt reports that she has been celebrating her birthday and had eaten out 4 nights in a row at places such as [employer] and the [employer], which she reports is what caused her HF exacerbation d/t higher salt intake.   Pt mentions that she drinks a lot of beverages daily. She drinks 2 Ensure, about 2 glasses of juice, 2 glasses of gingerale and about 2-3 cups of water.   Pt reports that her weight has been stable around 96  lbs but reports that she had previously been weighing 110 lbs. She did not recall how recently that weight loss had occurred.  Per review of weight history, pt does not appear to have had significant weight changes over the last 4 months. Will continue to monitor throughout admission.   Medications: lasix 80mg  BID, SSI 0-5 units daily, SSI 0-9 units TID, solu-medrol, protonix  Labs: BUN 48, Cr 1.68, CA 10.6, GFR 34, CBG's 122-352 x24 hours, HgbA1c 6.1% (08/25)  UOP: x24 hours I/O's: - since admit  NUTRITION - FOCUSED PHYSICAL EXAM:  Flowsheet Row Most Recent Value  Orbital Region Moderate depletion  Upper Arm Region Severe depletion  Thoracic and Lumbar Region Mild depletion  Buccal Region No depletion  Temple Region Mild depletion  Clavicle Bone Region Moderate depletion  Clavicle and Acromion Bone Region Mild depletion  Scapular Bone Region Mild depletion  Dorsal Hand Mild depletion  Patellar Region Severe depletion  Anterior Thigh Region Severe depletion  Posterior Calf Region Moderate depletion  Edema (RD Assessment) None  Hair Reviewed  Eyes Reviewed  Mouth Reviewed  Skin Reviewed  Nails Reviewed       Diet Order:   Diet Order             Diet Carb Modified Fluid consistency: Thin; Room service appropriate? Yes; Fluid restriction: 2000 mL Fluid  Diet effective now                   EDUCATION NEEDS:   Education needs have been addressed  Skin:  Skin Assessment: Reviewed RN  Assessment  Last BM:  10/14  Height:   Ht Readings from Last 1 Encounters:  09/25/23 4\' 11"  (1.499 m)    Weight:   Wt Readings from Last 1 Encounters:  09/27/23 41.9 kg   BMI:  Body mass index is 18.66 kg/m.  Estimated Nutritional Needs:   Kcal:  1400-1600  Protein:  70-85g  Fluid:  1.4-1.6L  Drusilla Kanner, RDN, LDN Clinical Nutrition

## 2023-09-27 NOTE — Evaluation (Signed)
Occupational Therapy Evaluation Patient Details Name: Brittany [name] MRN: [id number] DOB: 1961-08-11 Today's Date: 09/27/2023   History of Present Illness 62 y/o female presents [facility] 09/25/23 with SOB and admitted with acute on chronic systolic CHF. Prior admit 03/01/23 for COPD, chronic hypoxic respiratory failure and chronic combined systolic and diastolic CHF. PMH: PAF, hypertension, previous small cell lung cancer treated with chemo/ chest XRT/ prophylactic brain radiation in 2015, CAD s/p CABG, chronic systolic HF EF ~25%, CKD stage IIIa, prior history of tobacco abuse, a-fib, DBT2,   Clinical Impression   At baseline, pt is Independent with ADLs and performs functional mobility Independent without an AD to Mod I with a Rollator. Pt requiring max encouragement to participate in OT eval from bed level this day with pt declining sitting EOB and OOB activity this session. Pt presents with decreased activity tolerance and generalized B UE weakness. Pt demonstrates ability to complete self-feeding and UB/LB dressing from bed level with Mod I. OT to further assess pt's functional level with additional ADLs and functional transfers/mobility during future session. Pt will benefit from acute skilled OT services to address deficits outlined below. Post acute discharge, pt will benefit from continued skilled OT services in the home to maximize rehab potential. With increased participation in acute rehab pt may be able to be upgraded to no post-acute OT follow up.        If plan is discharge home, recommend the following: A little help with walking and/or transfers;A little help with bathing/dressing/bathroom;Assistance with cooking/housework;Help with stairs or ramp for entrance;Assist for transportation    Functional Status Assessment  Patient has had a recent decline in their functional status and demonstrates the ability to make significant improvements in function in a reasonable and  predictable amount of time.  Equipment Recommendations  Other (comment) (OT to assess further in future OT sessions.)    Recommendations for Other Services       Precautions / Restrictions Precautions Precautions: Fall Restrictions Weight Bearing Restrictions: No      Mobility Bed Mobility               General bed mobility comments: unable to assess bed mobility or transfers due to pt declining    Transfers                   General transfer comment: unable to assess bed mobility or transfers due to pt declining      Balance Overall balance assessment:  (unable to assess due to pt declining sitting EOB or OOB activities this session)                                         ADL either performed or assessed with clinical judgement   ADL                                         General ADL Comments: Unable to fully assess due to limited participation from pt. Pt presents with fatigue and cough and states she does "not feel well enough" to sit EOB or perform OOB activities this session. Pt reports she washed up at the sink Independently earlier this morning. Pt demonstrated ability to complete self-feeding and UB and LB dressing from bed level with Mod I this session.  Vision Baseline Vision/History: 1 Wears glasses (does not wear them all the time) Ability to See in Adequate Light: 0 Adequate Patient Visual Report: No change from baseline       Perception         Praxis         Pertinent Vitals/Pain Pain Assessment Pain Assessment: Faces Faces Pain Scale: Hurts even more Pain Location: generalized muscle spasms, worse in chest and back due to coughing Pain Descriptors / Indicators: Spasm Pain Intervention(s): Limited activity within patient's tolerance, Monitored during session, Repositioned     Extremity/Trunk Assessment Upper Extremity Assessment Upper Extremity Assessment: Generalized weakness;Right hand  dominant   Lower Extremity Assessment Lower Extremity Assessment: Defer to PT evaluation   Cervical / Trunk Assessment Cervical / Trunk Assessment: Normal   Communication Communication Communication: No apparent difficulties Cueing Techniques: Visual cues (occasional)   Cognition Arousal: Alert Behavior During Therapy: Agitated, WFL for tasks assessed/performed (Largely WFL but with pt presenting with occasional agitation throughout session.) Overall Cognitive Status: Within Functional Limits for tasks assessed                                 General Comments: Pt with low motivation to participate and requiring max encouragement to participate in session from bed level.     General Comments  VSS on 2L continuous O2 through nasal cannula throughout session.    Exercises Exercises: General Upper Extremity General Exercises - Upper Extremity Shoulder Flexion: AROM, Both, Supine, 5 reps Shoulder Extension: AROM, Both, 5 reps, Supine Shoulder ABduction: AROM, Both, 5 reps, Supine Shoulder ADduction: AROM, Both, 5 reps, Supine   Shoulder Instructions      Home Living Family/patient expects to be discharged to:: Private residence (sometimes staying with a friend and sometimes staying in a hotel) Living Arrangements: Alone Available Help at Discharge: Family;Friend(s);Available PRN/intermittently (sister) Type of Home: Other(Comment) (sometimes staying with a friend and sometimes staying in a hotel) Home Access: Level entry     Home Layout: One level     Bathroom Shower/Tub: Chief Strategy Officer: Standard Bathroom Accessibility: Yes How Accessible: Accessible via walker Home Equipment: Rollator (4 wheels);Grab bars - toilet;Grab bars - tub/shower   Additional Comments: Pt states on the weekends she lives with a friend in a house, during the week she lives in a hotel near her sister.      Prior Functioning/Environment Prior Level of Function :  Independent/Modified Independent;Driving             Mobility Comments: no AD for short distances, pt uses rollator if she is fatigued or has trouble breathing ADLs Comments: independent        OT Problem List: Decreased activity tolerance;Decreased strength      OT Treatment/Interventions: Self-care/ADL training;Therapeutic exercise;Energy conservation;DME and/or AE instruction;Therapeutic activities;Patient/family education    OT Goals(Current goals can be found in the care plan section) Acute Rehab OT Goals Patient Stated Goal: to feel better, stop coughing, and return to the hotel OT Goal Formulation: With patient Time For Goal Achievement: 10/11/23 Potential to Achieve Goals: Fair ADL Goals Pt Will Perform Grooming: with modified independence;standing Pt Will Perform Lower Body Bathing: with modified independence;sit to/from stand;sitting/lateral leans Pt Will Perform Lower Body Dressing: with modified independence;sitting/lateral leans;sit to/from stand Pt Will Transfer to Toilet: with modified independence;ambulating;regular height toilet (with least restrictive AD) Pt Will Perform Toileting - Clothing Manipulation and hygiene: with modified independence;sitting/lateral leans;sit  to/from stand Pt/caregiver will Perform Home Exercise Program: Increased strength;Both right and left upper extremity;With theraband;With theraputty;Independently;With written HEP provided (Increased activity tolerance) Additional ADL Goal #1: Patient will demonstrate ability to Independently state 3 energy conservation strategies for increased safety and independence with functional tasks.  OT Frequency: Min 1X/week    Co-evaluation PT/OT/SLP Co-Evaluation/Treatment: Yes Reason for Co-Treatment: Necessary to address cognition/behavior during functional activity PT goals addressed during session: Mobility/safety with mobility OT goals addressed during session: Strengthening/ROM;ADL's and  self-care      AM-PAC OT "6 Clicks" Daily Activity     Outcome Measure Help from another person eating meals?: None Help from another person taking care of personal grooming?: None (in sitting) Help from another person toileting, which includes using toliet, bedpan, or urinal?: A Little Help from another person bathing (including washing, rinsing, drying)?: A Little Help from another person to put on and taking off regular upper body clothing?: None Help from another person to put on and taking off regular lower body clothing?: None 6 Click Score: 22   End of Session Equipment Utilized During Treatment: Oxygen Nurse Communication: Mobility status;Other (comment) (Pt with limited participation from bed level and decilining sitting EOB or OOB activity.)  Activity Tolerance: Patient limited by fatigue;Other (comment) (Pt with self-limiting behaviors) Patient left: in bed;with call bell/phone within reach;with bed alarm set  OT Visit Diagnosis: Other (comment) (Decreased activity tolerance)                Time: 7829-5621 OT Time Calculation (min): 18 min Charges:  OT General Charges $OT Visit: 1 Visit OT Evaluation $OT Eval Low Complexity: 1 Low  Kinzlie Harney "Orson Eva., OTR/L, MA Acute Rehab [phone]   Lendon Colonel 09/27/2023, 1:22 PM

## 2023-09-27 NOTE — Progress Notes (Signed)
Encouraged patient to ambulate with assistance. Educated on the importance of mobility, and Pt still refused.

## 2023-09-27 NOTE — TOC Progression Note (Addendum)
Transition of Care [facility]) - Progression Note    Patient Details  Name: Brittany [name] MRN: [id number] Date of Birth: [birth year]  [facility] of [facility]) CM/SW Contact  Nicanor Bake Phone Number: [phone] 09/27/2023, 1:40 PM  Clinical Narrative:   [facility] CSW called pts PCP to schedule follow up hospital appointment. CSW spoke with Elease Hashimoto, who stated that at this time they do not have any appointments available within the next 14 business days and has to send it to the schedulers for review. CSW left contact information so she could be contacted in regards to scheduling.   CSW received a call back. Pt is scheduled for a Hospital follow up appointment scheduled for Thursday, October 11, 2023 at 10:0 AM.   TOC will continue following.     Expected Discharge Plan: Home/Self Care Barriers to Discharge: Continued Medical Work up  Expected Discharge Plan and Services       Living arrangements for the past 2 months: Single Family Home, Hotel/Motel                                       Social Determinants of Health (SDOH) Interventions SDOH Screenings   Food Insecurity: No Food Insecurity (09/25/2023)  Housing: Low Risk  (09/25/2023)  Transportation Needs: Unmet Transportation Needs (09/25/2023)  Utilities: Not At Risk (09/25/2023)  Alcohol Screen: Low Risk  (02/11/2021)  Depression (PHQ2-9): Low Risk  (05/30/2023)  Financial Resource Strain: Medium Risk (02/11/2021)  Physical Activity: Insufficiently Active (02/11/2021)  Social Connections: Unknown (02/11/2021)  Stress: Stress Concern Present (02/11/2021)  Tobacco Use: Medium Risk (09/25/2023)    Readmission Risk Interventions    06/20/2022   10:00 AM 04/11/2021   10:16 AM  Readmission Risk Prevention Plan  Transportation Screening Complete Complete  PCP or Specialist Appt within 3-5 Days Complete Complete  HRI or Home Care Consult Complete Complete  Social Work Consult for Recovery Care  Planning/Counseling Complete Complete  Palliative Care Screening Not Applicable Not Applicable  Medication Review Oceanographer) Complete Complete

## 2023-09-28 DIAGNOSIS — J441 Chronic obstructive pulmonary disease with (acute) exacerbation: Secondary | ICD-10-CM | POA: Diagnosis not present

## 2023-09-28 DIAGNOSIS — I480 Paroxysmal atrial fibrillation: Secondary | ICD-10-CM | POA: Diagnosis not present

## 2023-09-28 DIAGNOSIS — I10 Essential (primary) hypertension: Secondary | ICD-10-CM | POA: Diagnosis not present

## 2023-09-28 DIAGNOSIS — I5023 Acute on chronic systolic (congestive) heart failure: Secondary | ICD-10-CM | POA: Diagnosis not present

## 2023-09-28 LAB — BASIC METABOLIC PANEL
Anion gap: 16 — ABNORMAL HIGH (ref 5–15)
BUN: 64 mg/dL — ABNORMAL HIGH (ref 8–23)
CO2: 29 mmol/L (ref 22–32)
Calcium: 11.4 mg/dL — ABNORMAL HIGH (ref 8.9–10.3)
Chloride: 89 mmol/L — ABNORMAL LOW (ref 98–111)
Creatinine, Ser: 1.75 mg/dL — ABNORMAL HIGH (ref 0.44–1.00)
GFR, Estimated: 32 mL/min — ABNORMAL LOW (ref >60)
Glucose, Bld: 142 mg/dL — ABNORMAL HIGH (ref 70–99)
Potassium: 3.8 mmol/L (ref 3.5–5.1)
Sodium: 134 mmol/L — ABNORMAL LOW (ref 135–145)

## 2023-09-28 LAB — GLUCOSE, CAPILLARY
Glucose-Capillary: 250 mg/dL — ABNORMAL HIGH (ref 70–99)
Glucose-Capillary: 153 mg/dL — ABNORMAL HIGH (ref 70–99)
Glucose-Capillary: 301 mg/dL — ABNORMAL HIGH (ref 70–99)
Glucose-Capillary: 67 mg/dL — ABNORMAL LOW (ref 70–99)
Glucose-Capillary: 187 mg/dL — ABNORMAL HIGH (ref 70–99)

## 2023-09-28 LAB — MAGNESIUM: Magnesium: 2.3 mg/dL (ref 1.7–2.4)

## 2023-09-28 MED ORDER — METOLAZONE 5 MG PO TABS
5.0000 mg | ORAL_TABLET | Freq: Once | ORAL | Status: AC
  Administered 2023-09-28: 5 mg via ORAL
  Filled 2023-09-28: qty 1

## 2023-09-28 MED ORDER — POTASSIUM CHLORIDE CRYS ER 20 MEQ PO TBCR
40.0000 meq | EXTENDED_RELEASE_TABLET | Freq: Two times a day (BID) | ORAL | Status: AC
  Administered 2023-09-28 (×2): 40 meq via ORAL
  Filled 2023-09-28 (×2): qty 2

## 2023-09-28 MED ORDER — PREDNISONE 20 MG PO TABS
20.0000 mg | ORAL_TABLET | Freq: Every day | ORAL | Status: DC
  Administered 2023-09-29: 20 mg via ORAL
  Filled 2023-09-28: qty 1

## 2023-09-28 NOTE — Progress Notes (Signed)
Mobility Specialist Progress Note:   09/28/23 1055  Mobility  Activity Ambulated with assistance in hallway  Level of Assistance Contact guard assist, steadying assist  Assistive Device None  Distance Ambulated (ft) 300 ft  Range of Motion/Exercises Active;All extremities  Activity Response Tolerated well  Mobility Referral Yes  $Mobility charge 1 Mobility  Mobility Specialist Start Time (ACUTE ONLY) 1055  Mobility Specialist Stop Time (ACUTE ONLY) 1102  Mobility Specialist Time Calculation (min) (ACUTE ONLY) 7 min   Pt received in bed, required max encouragement to ambulate in hallway. Tolerated well, asx throughout. No AD required, CGA for safety. Returned pt to room, all needs met.   Feliciana Rossetti Mobility Specialist Please contact via Special educational needs teacher or  Rehab office at [phone]

## 2023-09-28 NOTE — Plan of Care (Signed)
  Problem: Education: Goal: Ability to describe self-care measures that may prevent or decrease complications (Diabetes Survival Skills Education) will improve Outcome: Progressing Goal: Individualized Educational Video(s) Outcome: Progressing   Problem: Coping: Goal: Ability to adjust to condition or change in health will improve Outcome: Progressing   Problem: Fluid Volume: Goal: Ability to maintain a balanced intake and output will improve Outcome: Progressing   Problem: Health Behavior/Discharge Planning: Goal: Ability to identify and utilize available resources and services will improve Outcome: Progressing Goal: Ability to manage health-related needs will improve Outcome: Progressing   Problem: Metabolic: Goal: Ability to maintain appropriate glucose levels will improve Outcome: Progressing   Problem: Nutritional: Goal: Maintenance of adequate nutrition will improve Outcome: Progressing Goal: Progress toward achieving an optimal weight will improve Outcome: Progressing   Problem: Skin Integrity: Goal: Risk for impaired skin integrity will decrease Outcome: Progressing   Problem: Tissue Perfusion: Goal: Adequacy of tissue perfusion will improve Outcome: Progressing   Problem: Education: Goal: Knowledge of General Education information will improve Description: Including pain rating scale, medication(s)/side effects and non-pharmacologic comfort measures Outcome: Progressing   Problem: Health Behavior/Discharge Planning: Goal: Ability to manage health-related needs will improve Outcome: Progressing   Problem: Clinical Measurements: Goal: Ability to maintain clinical measurements within normal limits will improve Outcome: Progressing Goal: Will remain free from infection Outcome: Progressing Goal: Diagnostic test results will improve Outcome: Progressing Goal: Respiratory complications will improve Outcome: Progressing Goal: Cardiovascular complication will  be avoided Outcome: Progressing   Problem: Activity: Goal: Risk for activity intolerance will decrease Outcome: Progressing   Problem: Nutrition: Goal: Adequate nutrition will be maintained Outcome: Progressing   Problem: Coping: Goal: Level of anxiety will decrease Outcome: Progressing   Problem: Elimination: Goal: Will not experience complications related to bowel motility Outcome: Progressing Goal: Will not experience complications related to urinary retention Outcome: Progressing   Problem: Pain Managment: Goal: General experience of comfort will improve Outcome: Progressing   Problem: Safety: Goal: Ability to remain free from injury will improve Outcome: Progressing   Problem: Skin Integrity: Goal: Risk for impaired skin integrity will decrease Outcome: Progressing   Problem: Education: Goal: Understanding of post-operative needs will improve Outcome: Progressing Goal: Individualized Educational Video(s) Outcome: Progressing   Problem: Clinical Measurements: Goal: Postoperative complications will be avoided or minimized Outcome: Progressing   Problem: Respiratory: Goal: Will regain and/or maintain adequate ventilation Outcome: Progressing   Problem: Education: Goal: Ability to describe self-care measures that may prevent or decrease complications (Diabetes Survival Skills Education) will improve Outcome: Progressing Goal: Individualized Educational Video(s) Outcome: Progressing   Problem: Coping: Goal: Ability to adjust to condition or change in health will improve Outcome: Progressing   Problem: Fluid Volume: Goal: Ability to maintain a balanced intake and output will improve Outcome: Progressing   Problem: Health Behavior/Discharge Planning: Goal: Ability to identify and utilize available resources and services will improve Outcome: Progressing Goal: Ability to manage health-related needs will improve Outcome: Progressing   Problem:  Metabolic: Goal: Ability to maintain appropriate glucose levels will improve Outcome: Progressing   Problem: Nutritional: Goal: Maintenance of adequate nutrition will improve Outcome: Progressing Goal: Progress toward achieving an optimal weight will improve Outcome: Progressing   Problem: Skin Integrity: Goal: Risk for impaired skin integrity will decrease Outcome: Progressing   Problem: Tissue Perfusion: Goal: Adequacy of tissue perfusion will improve Outcome: Progressing

## 2023-09-28 NOTE — Progress Notes (Signed)
Progress Note   Patient: Brittany [name] WUJ:[id number] DOB: 06-Feb-1961 DOA: 09/25/2023     3 DOS: the patient was seen and examined on 09/28/2023   Brief hospital course: Mrs. [name] was admitted to the hospital with the working diagnosis of heart failure exacerbation.   62 yo female with the past medical history of T2DM, hypertension, dyslipidemia, heart failure, atrial fibrillation, COPD and lung cancer who presented wit dyspnea. Patient has a thoracic impedance monitoring in place and has indicated volume overload over last 3 weeks, consequentially her furosemide has been increased with no much improvement in her symptoms. On the day of admission she had severe dyspnea prompting her to call EMS. Patient was in respiratory distress apparently became unresponsive on route. In the ED she was on non re-breather 15 L with 02 saturation 94%, she was transitioned to Bipap, her RR was 20 to 30, blood pressure 92/57, HR 81, and 02 saturation 99%, lungs with no wheezing or rhonchi, heart with S1 and S2 present and regular, abdomen with no distention and no lower extremity edema.   Chest radiograph with hyperinflation, with cardiomegaly, bilateral hilar vascular congestion with cephalization of the vasculature, bilateral small pleural effusions.   Assessment and Plan: * Acute on chronic systolic CHF (congestive heart failure) (HCC) Echocardiogram with reduced LV systolic function EF 20 to 25%, global hypokinesis, LV with severe dilatation, RV systolic function with mild reduction, RVSP 19,8 LA with moderate dilatation, no LVH,   Documented urine output 2,550 ml Systolic blood pressure 95 and 113 mmHg.   Plan to continue diuresis with furosemide and spironolactone. Continue with digoxin.   10/16 metolazone 2,5 mg  COPD with acute exacerbation (HCC) Positive signs of active air trapping.   Plan to continue aggressive bronchodilator therapy scheduled and as needed. Systemic and inhaled  corticosteroids. Airway clearing techniques with flutter valve and incentive spirometer.  Antitussive agents.   PAF (paroxysmal atrial fibrillation) (HCC) Continue rate control with digoxin and anticoagulation with apixaban.   Essential hypertension Continue blood pressure monitoring.   CKD stage 3b, GFR 30-44 ml/min (HCC) AKI, Hyponatremia.   Improved volume status.  Renal function today with serum cr at 1,68 with K at 4,3 and serum bicarbonate 28. Na 136 and Mg 2.4   Plan to continue diuresis with furosemide.  Check renal function and electrolytes in am.   Type 2 diabetes mellitus with hyperlipidemia (HCC) Continue glucose cover and monitoring with insulin sliding scale. Continue with statin therapy.   Protein-calorie malnutrition, severe (HCC) Continue with nutritional supplements.   Small cell lung cancer (HCC) Follow up as outpatient.        Subjective: patient with improvement in her dyspnea, no chest pain. No PND or orthopnea   Physical Exam: Vitals:   09/28/23 0437 09/28/23 0736 09/28/23 0746 09/28/23 1319  BP: 113/70 106/76  117/75  Pulse: 92   88  Resp: 17 19  18   Temp: 98.1 F (36.7 C)   98.1 F (36.7 C)  TempSrc: Oral   Oral  SpO2: 100% 100% 100% 98%  Weight: 41.7 kg     Height:       Neurology awake and alert ENT with mild pallor Cardiovascular with S1 and S2 present and regular with no gallops, rubs or murmurs Mild JVD No lower extremity edema Respiratory with improved ventilation with no wheezing or rhonchi, mild rales Abdomen with no distention  Data Reviewed:    Family Communication: no family at the bedside   Disposition: Status is: Inpatient Remains  inpatient appropriate because: diuresis   Planned Discharge Destination: Home    Author: Coralie Keens, MD 09/28/2023 3:50 PM  For on call review [email or web address].

## 2023-09-28 NOTE — Progress Notes (Signed)
Pt resting on 3l [place] with no distress. BIPAP not needed at this time.

## 2023-09-28 NOTE — Progress Notes (Addendum)
Advanced Heart Failure Rounding Note  PCP-Cardiologist: None   Subjective:    1.1L in UOP yesterday and only net negative 450 cc for the day. Wt down another 1 lb   SCr stable at 1.8   Feels slightly better today but still feels congested and c/w cough, some production w/ brownish colored sputum. Complains of rib pain due to coughing.  Not eating much. Doesn't have an appetite. + RUQ tenderness on exam.     Objective:   Weight Range: 41.7 kg Body mass index is 18.57 kg/m.   Vital Signs:   Temp:  [98.1 F (36.7 C)-98.3 F (36.8 C)] 98.1 F (36.7 C) (10/18 0437) Pulse Rate:  [92-100] 92 (10/18 0437) Resp:  [16-19] 19 (10/18 0736) BP: (106-122)/(66-76) 106/76 (10/18 0736) SpO2:  [96 %-100 %] 100 % (10/18 0746) Weight:  [41.7 kg-41.9 kg] 41.7 kg (10/18 0437) Last BM Date : 09/27/23  Weight change: Filed Weights   09/27/23 0341 09/27/23 1200 09/28/23 0437  Weight: 43.4 kg 41.9 kg 41.7 kg    Intake/Output:   Intake/Output Summary (Last 24 hours) at 09/28/2023 1005 Last data filed at 09/28/2023 0440 Gross per 24 hour  Intake 440 ml  Output 1130 ml  Net -690 ml      Physical Exam   General:  chronically ill appearing. No respiratory difficulty HEENT: normal Neck: supple. JVD 9 cm Carotids 2+ bilat; no bruits. No lymphadenopathy or thyromegaly appreciated. Cor: PMI nondisplaced. Regular rate & rhythm. No rubs, gallops or murmurs. Lungs: decreased BS at the bases, + expiratory rhonchi rt base  Abdomen: soft, nondistended. +RUQ tenderness w/ palpation. No hepatosplenomegaly. No bruits or masses. Good bowel sounds. Extremities: no cyanosis, clubbing, rash, edema Neuro: alert & oriented x 3, cranial nerves grossly intact. moves all 4 extremities w/o difficulty. Affect pleasant.   Telemetry   NSR 80s   Labs    CBC Recent Labs    09/26/23 0334  WBC 14.7*  HGB 12.3  HCT 38.6  MCV 88.9  PLT 152   Basic Metabolic Panel Recent Labs    24/40/10 0249  09/28/23 0446  NA 136 134*  K 4.3 3.8  CL 94* 89*  CO2 28 29  GLUCOSE 171* 142*  BUN 48* 64*  CREATININE 1.68* 1.75*  CALCIUM 10.6* 11.4*  MG 2.4 2.3   Liver Function Tests No results for input(s): "AST", "ALT", "ALKPHOS", "BILITOT", "PROT", "ALBUMIN" in the last 72 hours.  No results for input(s): "LIPASE", "AMYLASE" in the last 72 hours. Cardiac Enzymes No results for input(s): "CKTOTAL", "CKMB", "CKMBINDEX", "TROPONINI" in the last 72 hours.  BNP: BNP (last 3 results) Recent Labs    08/16/23 1458 09/25/23 0421 09/26/23 0334  BNP 1,342.5* 4,334.8* 2,664.6*    ProBNP (last 3 results) No results for input(s): "PROBNP" in the last 8760 hours.   D-Dimer No results for input(s): "DDIMER" in the last 72 hours. Hemoglobin A1C No results for input(s): "HGBA1C" in the last 72 hours. Fasting Lipid Panel No results for input(s): "CHOL", "HDL", "LDLCALC", "TRIG", "CHOLHDL", "LDLDIRECT" in the last 72 hours. Thyroid Function Tests No results for input(s): "TSH", "T4TOTAL", "T3FREE", "THYROIDAB" in the last 72 hours.  Invalid input(s): "FREET3"  Other results:   Imaging    No results found.   Medications:     Scheduled Medications:  apixaban  2.5 mg Oral BID   atorvastatin  80 mg Oral Daily   digoxin  125 mcg Oral Daily   feeding supplement  237 mL  Oral BID BM   furosemide  80 mg Intravenous BID   guaiFENesin-dextromethorphan  5 mL Oral QID   insulin aspart  0-15 Units Subcutaneous TID WC   insulin aspart  0-5 Units Subcutaneous QHS   insulin glargine-yfgn  8 Units Subcutaneous Daily   methylPREDNISolone (SOLU-MEDROL) injection  40 mg Intravenous Q24H   mometasone-formoterol  2 puff Inhalation BID   multivitamin with minerals  1 tablet Oral Daily   pantoprazole  40 mg Oral Daily   spironolactone  12.5 mg Oral Daily    Infusions:   PRN Medications: acetaminophen **OR** acetaminophen, calcium carbonate, hydrALAZINE, ipratropium-albuterol,  traMADol    Patient Profile  [name] is a 62 year old with a history of  PAF,  previous smoker quit 2015, hypertension, 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   Admitted with A/C respiratory failure 2/2 A/C HFrEF + COPDE  Assessment/Plan   1. Acute/Chronic Hypoxic Respiratory Failure/ AECOPD  -Hx frequent admissions for AE COPD + A/C HFrEF.  -Initially on Bipap, now on supplemental O2 [place], 3L  -ON steroids and nebs. Per TR[facility]    2. A/C HFrEF, ICM -EF has been 20-25% for the last 5 years. -Most recent echo 05/24: EF 20-25%, RV mildly reduced. -S/p Med[employer] ICD and Barostim -Presented with NYHA IV functional class.  -Volume overload in the setting of dietary indiscretion. Optivol was suggestive of fluid accumulation for the last 2-3 weeks. BNP >4000.  -Volume status improving but she remains volume overloaded. Repeat dose of IV Lasix 80 mg today. Likely transition back to PO dietetics tomorrow  -Dig level < 0.2. Continue for now.  -Continue spironolactone 12.5 mg daily  -Hold off on SGLT2i but will restart prior to d/c  -Not a candidate for advanced therapies due to severity of lung disease.    3. CKD Stage IIIb  -Creatinine baseline ~ 1.4-1.6 -Cr stable 1.8  today.   -Follow daily BMET   4. PAF  -Maintaining SR.  -Continue eliquis 2.5 mg twice a day. Reduced dose (weight/creatinine)   5. CAD -History of CABG x2 2019 -2020 DES ostial ramus extending to left main  - HS troponin mildly elevated, flat trend. Doubt ACS. Suspect related to HF. - No chest pain.    6. H/O SCLC Completed treatment 2015. CT chest (5/24) soft tissue thickening along mediastinum but no discrete mass    7. H/O Renal Infarct 2022  8. DNR     Length of Stay: [address] PA-C  09/28/2023, 10:05  AM  Advanced Heart Failure Team Pager [phone] (M-F; 7a - 5p)  Please contact [facility] for night-coverage after hours (5p -7a ) and weekends on [email or web address]  Patient seen and examined with the above-signed Advanced Practice Provider and/or Housestaff. I personally reviewed laboratory data, imaging studies and relevant notes. I independently examined the patient and formulated the important aspects of the plan. I have edited the note to reflect any of my changes or salient points. I have personally discussed the plan with the patient and/or family.  Modest diuresis with IV lasix. Weight down 5-6 pounds. Still SOB but improving. Renal function stable.   Still on steroids and nebs.   General: Sitting up in bed. No resp difficulty HEENT: normal Neck: supple. JVP 8 Carotids 2+ bilat; no bruits. No lymphadenopathy or  thryomegaly appreciated. Cor: Regular rate & rhythm. No rubs, gallops or murmurs. Lungs: + crackles Abdomen: soft, nontender, nondistended. No hepatosplenomegaly. No bruits or masses. Good bowel sounds. Extremities: no cyanosis, clubbing, rash, edema Neuro: alert & orientedx3, cranial nerves grossly intact. moves all 4 extremities w/o difficulty. Affect pleasant  Remains mildly volume overloaded. Will continue IV diuresis one more day + metolazone Hopefully can go home tomorrow on previous HF meds.   Arvilla Meres, MD  4:43 PM

## 2023-09-28 NOTE — Inpatient Diabetes Management (Signed)
Inpatient Diabetes Program Recommendations  AACE/ADA: New Consensus Statement on Inpatient Glycemic Control (2015)  Target Ranges:  Prepandial:   less than 140 mg/dL      Peak postprandial:   less than 180 mg/dL (1-2 hours)      Critically ill patients:  140 - 180 mg/dL   Lab Results  Component Value Date   GLUCAP 250 (H) 09/28/2023   HGBA1C 6.1 (H) 08/05/2023    Review of Glycemic Control  Diabetes history: DM2 Outpatient Diabetes medications: Farxiga 10 mg daily Current orders for Inpatient glycemic control: Semglee 8 units daily, Novolog 0-15 units correction scale TID, Novolog 0-5 units HS scale  Inpatient Diabetes Program Recommendations:   Noted that blood sugars continue to be greater than 180 mg/dl.  Recommend increasing Semglee to 10 units daily if blood sugars continue to be elevated. Patient is not eating well, getting supplements. Titrate dosages as needed.   Smith Mince RN BSN CDE Diabetes Coordinator Pager: [phone]  8am-5pm

## 2023-09-28 NOTE — Plan of Care (Signed)
[facility] Tonsillectomy/Adenoidectomy, Postoperative PEDS care plan entered in error.

## 2023-09-29 ENCOUNTER — Other Ambulatory Visit (HOSPITAL_COMMUNITY): Payer: Self-pay

## 2023-09-29 DIAGNOSIS — I480 Paroxysmal atrial fibrillation: Secondary | ICD-10-CM | POA: Diagnosis not present

## 2023-09-29 DIAGNOSIS — J441 Chronic obstructive pulmonary disease with (acute) exacerbation: Secondary | ICD-10-CM | POA: Diagnosis not present

## 2023-09-29 DIAGNOSIS — I5023 Acute on chronic systolic (congestive) heart failure: Secondary | ICD-10-CM | POA: Diagnosis not present

## 2023-09-29 DIAGNOSIS — I10 Essential (primary) hypertension: Secondary | ICD-10-CM | POA: Diagnosis not present

## 2023-09-29 LAB — BASIC METABOLIC PANEL
Anion gap: 17 — ABNORMAL HIGH (ref 5–15)
BUN: 78 mg/dL — ABNORMAL HIGH (ref 8–23)
CO2: 28 mmol/L (ref 22–32)
Calcium: 11.4 mg/dL — ABNORMAL HIGH (ref 8.9–10.3)
Chloride: 86 mmol/L — ABNORMAL LOW (ref 98–111)
Creatinine, Ser: 1.74 mg/dL — ABNORMAL HIGH (ref 0.44–1.00)
GFR, Estimated: 33 mL/min — ABNORMAL LOW (ref >60)
Glucose, Bld: 144 mg/dL — ABNORMAL HIGH (ref 70–99)
Potassium: 5 mmol/L (ref 3.5–5.1)
Sodium: 131 mmol/L — ABNORMAL LOW (ref 135–145)

## 2023-09-29 LAB — GLUCOSE, CAPILLARY
Glucose-Capillary: 137 mg/dL — ABNORMAL HIGH (ref 70–99)
Glucose-Capillary: 216 mg/dL — ABNORMAL HIGH (ref 70–99)

## 2023-09-29 LAB — MAGNESIUM: Magnesium: 2.4 mg/dL (ref 1.7–2.4)

## 2023-09-29 MED ORDER — SPIRONOLACTONE 25 MG PO TABS: 12.5000 mg | ORAL_TABLET | Freq: Every day | ORAL | Status: DC

## 2023-09-29 MED ORDER — GUAIFENESIN-DM 100-10 MG/5ML PO SYRP
5.0000 mL | ORAL_SOLUTION | Freq: Four times a day (QID) | ORAL | 0 refills | Status: DC | PRN
  Filled 2023-09-29: qty 118, 6d supply, fill #0

## 2023-09-29 MED ORDER — ACETAMINOPHEN 325 MG PO TABS: 650.0000 mg | ORAL_TABLET | Freq: Four times a day (QID) | ORAL | Status: DC | PRN

## 2023-09-29 NOTE — Discharge Summary (Signed)
Physician Discharge Summary   Patient: Brittany [name] MRN: [id number] DOB: [birth year]  Admit date:     09/25/2023  Discharge date: 09/29/23  Discharge Physician: Coralie Keens   PCP: Georganna Skeans, MD   Recommendations at discharge:  Patient will continue diuretic regimen with torsemide, SGLT 2 inh, spironolactone and metolazone. (Dose of spironolactone decreased due to high normal K at the time of discharge). Continue bronchodilator therapy at home. Follow up renal function and electrolytes in 7 days as outpatient Follow up with Dr Andrey Campanile in 7 to 10 days. Follow up with Cardiology as scheduled.   Discharge Diagnoses: Principal Problem:   Acute on chronic systolic CHF (congestive heart failure) (HCC) Active Problems:   COPD with acute exacerbation (HCC)   PAF (paroxysmal atrial fibrillation) (HCC)   Essential hypertension   Type 2 diabetes mellitus with hyperlipidemia (HCC)   CKD stage 3b, GFR 30-44 ml/min (HCC)   Protein-calorie malnutrition, severe (HCC)   Small cell lung cancer (HCC)  Resolved Problems:   * No resolved hospital problems. [name]: [name] was admitted to the hospital with the working diagnosis of heart failure exacerbation, and COPD exacerbation.   62 yo female with the past medical history of T2DM, hypertension, dyslipidemia, heart failure, atrial fibrillation, COPD and lung cancer who presented wit dyspnea. Patient has a thoracic impedance monitoring in place and has indicated volume overload over last 3 weeks, consequentially her furosemide has been increased with no much improvement in her symptoms. On the day of admission she had severe dyspnea prompting her to call EMS. Patient was in respiratory distress apparently became unresponsive on route. In the ED she was on non re-breather 15 L with 02 saturation 94%, she was transitioned to Bipap, her RR was 20 to 30, blood pressure 92/57, HR 81, and 02 saturation 99%, lungs with  no wheezing or rhonchi, heart with S1 and S2 present and regular, abdomen with no distention and no lower extremity edema.   ABG 7.21/ 73.3/ 279/ 33/ 100% Na 140, K 3,5 Cl 98, bicarbonate 24, glucose 379, BUN 22, cr 1,66  Mg 4.5  BNP 4,334 High sensitive troponin 65  Wbc 12.8 hgb 15.6 plt 208   Chest radiograph with hyperinflation, with cardiomegaly, bilateral hilar vascular congestion with cephalization of the vasculature, bilateral small pleural effusions.   EKG 113 bpm, right axis deviation, interventricular conduction delay, qtc 513, sinus rhythm with bi atrial enlargement, no significant ST segment or T wave changes.   Patient was placed on IV furosemide for diuresis, aggressive bronchodilator therapy, systemic and inhaled corticosteroids.   10/19 volume status and air trapping have improved. Plan to continue medical therapy at home. Follow up as outpatient.  Assessment and Plan: * Acute on chronic systolic CHF (congestive heart failure) (HCC) Echocardiogram with reduced LV systolic function EF 20 to 25%, global hypokinesis, LV with moderate dilatation, RV systolic function with mild reduction, RVSP 36.6 mmHg, LA with mild dilatation, aortic regurgitation moderate to severe, no aortic stenosis.   Patient was placed on furosemide and metolazone for diuresis, negative fluid balance was achieved, -3,765 ml, with significant improvement in her symptoms.   Plan to continue medical therapy with digoxin, torsemide, metolazone and spironolactone.  Acute hypercapnic and hyppoxemic respiratory failure, due to cardiogenic pulmonary edema and COPD exacerbation.  Her oxygenation and ventilation improved with medical therapy. At the time of her discharge her 02 saturation is 97% on room air. Respiratory failure now resolved.   COPD with acute exacerbation (  HCC) Improved signs of active air trapping.   Patient was placed on aggressive bronchodilator therapy scheduled and as needed. Inhaled  and systemic corticosteroids.  Airway clearing techniques with flutter valve and incentive spirometer.  Antitussive agents.   At the time of her discharge will discontinue steroids. Follow up as outpatient.  PAF (paroxysmal atrial fibrillation) (HCC) Continue rate control with digoxin and anticoagulation with apixaban.   Essential hypertension Continue blood pressure monitoring.   CKD stage 3b, GFR 30-44 ml/min (HCC) AKI, Hyponatremia. Hypokalemia.   K was corrected.  At the time of her discharge her serum cr is 1,74 with K at 5,0 and serum bicarbonate at 28. Na 131 Mg 2.4  Plan to continue diuresis with metolazone, spironolactone and torsemide.  Follow up renal function as outpatient,   Type 2 diabetes mellitus with hyperlipidemia (HCC) Continue glucose cover and monitoring with insulin sliding scale. Continue with statin therapy.   Protein-calorie malnutrition, severe (HCC) Continue with nutritional supplements.   Small cell lung cancer (HCC) Follow up as outpatient.         Consultants: cardiology  Procedures performed: none   Disposition: Home Diet recommendation:  Cardiac and Carb modified diet DISCHARGE MEDICATION: Allergies as of 09/29/2023       Reactions   Lactose Intolerance (gi)    Per patient   Codeine Nausea And Vomiting        Medication List     TAKE these medications    acetaminophen 325 MG tablet Commonly known as: TYLENOL Take 2 tablets (650 mg total) by mouth every 6 (six) hours as needed for mild pain (pain score 1-3) (or Fever >/= 101).   albuterol 108 (90 Base) MCG/ACT inhaler Commonly known as: VENTOLIN HFA Inhale 2 puffs into the lungs every 6 (six) hours as needed for wheezing or shortness of breath.   apixaban 2.5 MG Tabs tablet Commonly known as: ELIQUIS Take 1 tablet (2.5 mg total) by mouth 2 (two) times daily.   atorvastatin 80 MG tablet Commonly known as: LIPITOR Take 1 tablet (80 mg total) by mouth daily.    dapagliflozin propanediol 10 MG Tabs tablet Commonly known as: Farxiga Take 1 tablet by mouth daily.   digoxin 0.125 MG tablet Commonly known as: LANOXIN TAKE 1 TABLET BY MOUTH DAILY   Ensure Original Liqd Take 2-3 Bottles by mouth daily.   guaiFENesin-dextromethorphan 100-10 MG/5ML syrup Commonly known as: ROBITUSSIN DM Take 5 mLs by mouth every 6 (six) hours as needed for cough. What changed: when to take this   hydrOXYzine 25 MG capsule Commonly known as: VISTARIL TAKE 1 CAPSULE (25 MG TOTAL) BY MOUTH DAILY AS NEEDED.   ipratropium-albuterol 0.5-2.5 (3) MG/3ML Soln Commonly known as: DUONEB Take 3 mLs by nebulization every 4 (four) hours as needed (for shortness of breath).   ivabradine 7.5 MG Tabs tablet Commonly known as: Corlanor Take 1 tablet (7.5 mg total) by mouth 2 (two) times daily with a meal. TAKE 1 TABLET (7.5 MG TOTAL) BY MOUTH 2 (TWO) TIMES DAILY WITH A MEAL.   metolazone 2.5 MG tablet Commonly known as: ZAROXOLYN Take 1 tablet (2.5 mg total) by mouth once a week. Take every Wednesday with extra 40 mEq of Potassium What changed:  when to take this additional instructions   nitroGLYCERIN 0.4 MG SL tablet Commonly known as: NITROSTAT Place 1 tablet (0.4 mg total) under the tongue every 5 (five) minutes as needed for chest pain.   potassium chloride 10 MEQ tablet Commonly known as: KLOR-CON  Take 4 tablets (40 mEq total) by mouth 2 (two) times daily. Take extra 4 tablets (40 mEq total) with weekly Metolazone dose What changed: additional instructions   spironolactone 25 MG tablet Commonly known as: ALDACTONE Take 0.5 tablets (12.5 mg total) by mouth at bedtime. What changed: how much to take   torsemide 20 MG tablet Commonly known as: DEMADEX Take 4 tablets (80 mg total) by mouth 2 (two) times daily. What changed:  how much to take when to take this additional instructions   Trelegy Ellipta 200-62.5-25 MCG/ACT Aepb Generic drug:  Fluticasone-Umeclidin-Vilant Inhale 1 puff into the lungs daily.        Follow-up Information     Georganna Skeans, MD. Go in 13 day(s).   Specialty: Family Medicine Why: Hospital follow up appointment scheduled for Thursday, October 11, 2023 at 10:0 AM. PLEASE ARRIVE 10 - 15 minutes  early for appointment.  PLEASE call and cancel/reschedule appoinment if you CANNOT make it. Contact information: [address] [place] [address] [phone]         [facility] Follow up.   Specialty: Cardiology Why: Hospital Follow-up at [facility]   10/09/23 at 12:00 PM Contact information: [address] [place] [address] [phone]               Discharge Exam: Ceasar Mons Weights   09/27/23 1200 09/28/23 0437 09/29/23 0410  Weight: 41.9 kg 41.7 kg 41.5 kg   BP 106/83 (BP Location: Right Arm)   Pulse (!) 101   Temp 98.1 F (36.7 C) (Oral)   Resp 20   Ht 4\' 11"  (1.499 m)   Wt 41.5 kg   SpO2 97%   BMI 18.48 kg/m   Patient with improvement in dyspnea and edema, no PNDD or orthopnea  Neurology awake and alert ENT with mild pallor Cardiovascular with S1 and S2 present and regular with no gallops, or rubs.  Respiratory with no rales or wheezing, no rhonchi Abdomen with no distention No lower extremity edema   Condition at discharge: stable  The results of significant diagnostics from this hospitalization (including imaging, microbiology, ancillary and laboratory) are listed below for reference.   Imaging Studies: ECHOCARDIOGRAM COMPLETE  Result Date: 09/26/2023    ECHOCARDIOGRAM REPORT   Patient Name:   Brittany [name] Date of Exam: 09/26/2023 Medical Rec #:  [id number]               Height:       59.0 in Accession #:    [id number]              Weight:       97.4 lb Date of Birth:  [birth year]               BSA:          1.359 m Patient  Age:    62 years                BP:           101/82 mmHg Patient Gender: F                       HR:           80 bpm. Exam Location:  Inpatient Procedure: 2D Echo, Color Doppler and Cardiac Doppler Indications:    Abnormal ECG  History:  Patient has prior history of Echocardiogram examinations, most                 recent 04/20/2023. CHF and Cardiomyopathy, Previous Myocardial                 Infarction, Defibrillator, CKD, lung cancer and COPD,                 Arrythmias:Atrial Fibrillation; Risk Factors:Diabetes and                 Dyslipidemia.  Sonographer:    Milbert Coulter Referring Phys: [id number] BINAYA DAHAL IMPRESSIONS  1. Left ventricular ejection fraction, by estimation, is 20 to 25%. The left ventricle has severely decreased function. The left ventricle demonstrates global hypokinesis. The left ventricular internal cavity size was moderately dilated. Left ventricular diastolic parameters are consistent with Grade II diastolic dysfunction (pseudonormalization).  2. Right ventricular systolic function is mildly reduced. The right ventricular size is normal. There is mildly elevated pulmonary artery systolic pressure. The estimated right ventricular systolic pressure is 36.6 mmHg.  3. Left atrial size was mildly dilated.  4. The mitral valve is grossly normal. Mild mitral valve regurgitation. No evidence of mitral stenosis.  5. The aortic valve was not well visualized. Aortic valve regurgitation is moderate to severe. No aortic stenosis is present.  6. The inferior vena cava is normal in size with greater than 50% respiratory variability, suggesting right atrial pressure of 3 mmHg. Comparison(s): No significant change from prior study. FINDINGS  Left Ventricle: Left ventricular ejection fraction, by estimation, is 20 to 25%. The left ventricle has severely decreased function. The left ventricle demonstrates global hypokinesis. The left ventricular internal cavity size was moderately dilated. There is no  left ventricular hypertrophy. Left ventricular diastolic parameters are consistent with Grade II diastolic dysfunction (pseudonormalization). Right Ventricle: The right ventricular size is normal. No increase in right ventricular wall thickness. Right ventricular systolic function is mildly reduced. There is mildly elevated pulmonary artery systolic pressure. The tricuspid regurgitant velocity  is 2.90 m/s, and with an assumed right atrial pressure of 3 mmHg, the estimated right ventricular systolic pressure is 36.6 mmHg. Left Atrium: Left atrial size was mildly dilated. Right Atrium: Right atrial size was normal in size. Pericardium: There is no evidence of pericardial effusion. Mitral Valve: The mitral valve is grossly normal. Mild mitral valve regurgitation. No evidence of mitral valve stenosis. Tricuspid Valve: The tricuspid valve is grossly normal. Tricuspid valve regurgitation is mild . No evidence of tricuspid stenosis. Aortic Valve: The aortic valve was not well visualized. Aortic valve regurgitation is moderate to severe. Aortic regurgitation PHT measures 278 msec. No aortic stenosis is present. Aortic valve mean gradient measures 8.0 mmHg. Aortic valve peak gradient measures 14.4 mmHg. Aortic valve area, by VTI measures 1.72 cm. Pulmonic Valve: The pulmonic valve was grossly normal. Pulmonic valve regurgitation is not visualized. No evidence of pulmonic stenosis. Aorta: The aortic root is normal in size and structure. Venous: The inferior vena cava is normal in size with greater than 50% respiratory variability, suggesting right atrial pressure of 3 mmHg. IAS/Shunts: The atrial septum is grossly normal. Additional Comments: A device lead is visualized in the right atrium and right ventricle.  LEFT VENTRICLE PLAX 2D LVIDd:         6.20 cm   Diastology LVIDs:         5.30 cm   LV e' lateral:   11.30 cm/s LV PW:  1.00 cm   LV E/e' lateral: 7.4 LV IVS:        0.70 cm LVOT diam:     1.70 cm LV SV:          56 LV SV Index:   41 LVOT Area:     2.27 cm  RIGHT VENTRICLE RV Basal diam:  3.50 cm RV Mid diam:    2.60 cm RV S prime:     8.70 cm/s TAPSE (M-mode): 1.5 cm LEFT ATRIUM             Index        RIGHT ATRIUM          Index LA diam:        4.50 cm 3.31 cm/m   RA Area:     8.95 cm LA Vol (A2C):   47.5 ml 34.96 ml/m  RA Volume:   19.00 ml 13.98 ml/m LA Vol (A4C):   46.8 ml 34.44 ml/m LA Biplane Vol: 47.7 ml 35.11 ml/m  AORTIC VALVE AV Area (Vmax):    1.86 cm AV Area (Vmean):   1.67 cm AV Area (VTI):     1.72 cm AV Vmax:           190.00 cm/s AV Vmean:          131.000 cm/s AV VTI:            0.324 m AV Peak Grad:      14.4 mmHg AV Mean Grad:      8.0 mmHg LVOT Vmax:         156.00 cm/s LVOT Vmean:        96.500 cm/s LVOT VTI:          0.246 m LVOT/AV VTI ratio: 0.76 AI PHT:            278 msec  AORTA Ao Root diam: 2.60 cm MITRAL VALVE               TRICUSPID VALVE MV Area (PHT): 5.31 cm    TR Peak grad:   33.6 mmHg MV Decel Time: 143 msec    TR Vmax:        290.00 cm/s MV E velocity: 83.30 cm/s MV A velocity: 60.80 cm/s  SHUNTS MV E/A ratio:  1.37        Systemic VTI:  0.25 m                            Systemic Diam: 1.70 cm Lennie Odor MD Electronically signed by Lennie Odor MD Signature Date/Time: 09/26/2023/5:28:55 PM    Final    DG Chest Port 1 View  Result Date: 09/25/2023 CLINICAL DATA:  Dyspnea and respiratory distress. EXAM: PORTABLE CHEST 1 VIEW COMPARISON:  Portable chest 08/03/2023 FINDINGS: 4:12 a.m. The heart is enlarged. 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 Left-sided single lead pacemaker/AID with stable wire insertions. There is mild perihilar vascular congestion mild interstitial edema in the lower lung zones with minimal pleural effusions. Lungs are emphysematous and clear of focal infiltrates. The mediastinum is normally outlined. A right chest battery with single lead extending into the right neck is again noted. No new osseous findings. IMPRESSION: 1. Cardiomegaly with mild perihilar  vascular congestion and mild interstitial edema. 2. Emphysema. No focal infiltrates. 3. Minimal pleural effusions are beginning to form. Electronically Signed   By: Almira Bar M.D.   On: 09/25/2023 06:05   CUP PACEART REMOTE DEVICE CHECK  Result Date: 09/19/2023 Scheduled remote reviewed. Normal device function.  Next remote 91 days. ML, CVRS Scheduled remote reviewed. Normal device function.  Next remote 91 days. ML, CVRS   Microbiology: Results for orders placed or performed during the hospital encounter of 09/25/23  Resp panel by RT-PCR (RSV, Flu A&B, Covid) Anterior Nasal Swab     Status: None   Collection Time: 09/25/23  4:11 AM   Specimen: Anterior Nasal Swab  Result Value Ref Range Status   SARS Coronavirus 2 by RT PCR NEGATIVE NEGATIVE Final   Influenza A by PCR NEGATIVE NEGATIVE Final   Influenza B by PCR NEGATIVE NEGATIVE Final    Comment: (NOTE) The Xpert Xpress SARS-CoV-2/FLU/RSV plus assay is intended as an aid in the diagnosis of influenza from Nasopharyngeal swab specimens and should not be used as a sole basis for treatment. Nasal washings and aspirates are unacceptable for Xpert Xpress SARS-CoV-2/FLU/RSV testing.  Fact Sheet for Patients: BloggerCourse.com  Fact Sheet for Healthcare Providers: SeriousBroker.it  This test is not yet approved or cleared by the [place] FDA and has been authorized for detection and/or diagnosis of SARS-CoV-2 by FDA under an Emergency Use Authorization (EUA). This EUA will remain in effect (meaning this test can be used) for the duration of the COVID-19 declaration under Section 564(b)(1) of the Act, 21 U.S.C. section 360bbb-3(b)(1), unless the authorization is terminated or revoked.     Resp Syncytial Virus by PCR NEGATIVE NEGATIVE Final    Comment: (NOTE) Fact Sheet for Patients: BloggerCourse.com  Fact Sheet for Healthcare  Providers: SeriousBroker.it  This test is not yet approved or cleared by the [place] FDA and has been authorized for detection and/or diagnosis of SARS-CoV-2 by FDA under an Emergency Use Authorization (EUA). This EUA will remain in effect (meaning this test can be used) for the duration of the COVID-19 declaration under Section 564(b)(1) of the Act, 21 U.S.C. section 360bbb-3(b)(1), unless the authorization is terminated or revoked.  Performed at [facility], [address] [place], [place] [address]     Labs: CBC: Recent Labs  Lab 09/25/23 0421 09/25/23 0428 09/25/23 0527 09/26/23 0334  WBC 12.8*  --   --  14.7*  NEUTROABS 9.5*  --   --   --   HGB 14.5  15.6* 17.0* 16.3* 12.3  HCT 47.1*  46.0 50.0* 48.0* 38.6  MCV 92.7  --   --  88.9  PLT 208  --   --  152   Basic Metabolic Panel: Recent Labs  Lab 09/25/23 0421 09/25/23 0428 09/25/23 0527 09/26/23 0334 09/27/23 0249 09/28/23 0446 09/29/23 0327  NA 140  137 140 138 134* 136 134* 131*  K 3.5  3.4* 3.3* 2.7* 4.2 4.3 3.8 5.0  CL 98 101  --  97* 94* 89* 86*  CO2 24  --   --  25 28 29 28   GLUCOSE 379* 380*  --  272* 171* 142* 144*  BUN 22 24*  --  38* 48* 64* 78*  CREATININE 1.66* 1.60*  --  1.81* 1.68* 1.75* 1.74*  CALCIUM 9.4  --   --  9.7 10.6* 11.4* 11.4*  MG 4.5*  --   --  2.4 2.4 2.3 2.4   Liver Function Tests: Recent Labs  Lab 09/25/23 0421  AST 40  ALT 21  ALKPHOS 90  BILITOT 1.1  PROT 7.5  ALBUMIN 4.0   CBG: Recent Labs  Lab 09/28/23 1611 09/28/23 2121 09/28/23 2224 09/29/23 0554 09/29/23  1107  GLUCAP 301* 67* 187* 137* 216*    Discharge time spent: greater than 30 minutes.  Signed: Coralie Keens, MD [facility] 09/29/2023

## 2023-09-29 NOTE — Progress Notes (Signed)
Progress Note  Patient Name: Brittany [name] Date of Encounter: 09/29/2023 Primary Cardiologist: [facility]- Bensimohn  Subjective   Overnight no events. Patient notes no CP, SOB, Palpitations. She is eager to leave  Vital Signs    Vitals:   09/28/23 2016 09/29/23 0040 09/29/23 0410 09/29/23 0901  BP:  114/77 100/65 94/60  Pulse:  93 95 90  Resp:  18 17 18   Temp:  98.6 F (37 C) 98.4 F (36.9 C) 98.4 F (36.9 C)  TempSrc:  Oral Oral Oral  SpO2: 98% 99% 99% 94%  Weight:   41.5 kg   Height:        Intake/Output Summary (Last 24 hours) at 09/29/2023 1011 Last data filed at 09/29/2023 0900 Gross per 24 hour  Intake 595 ml  Output 1700 ml  Net -1105 ml   Filed Weights   09/27/23 1200 09/28/23 0437 09/29/23 0410  Weight: 41.9 kg 41.7 kg 41.5 kg    Physical Exam   GEN: No acute distress, thin female Neck: No JVD Cardiac: regular tachycardia, no murmurs, rubs, or gallops.  Respiratory: Clear to auscultation bilaterally. GI: Soft, nontender, non-distended  MS: No edema  Labs   Telemetry: sinus tachycardia   Chemistry Recent Labs  Lab 09/25/23 0421 09/25/23 0428 09/27/23 0249 09/28/23 0446 09/29/23 0327  NA 140  137   < > 136 134* 131*  K 3.5  3.4*   < > 4.3 3.8 5.0  CL 98   < > 94* 89* 86*  CO2 24   < > 28 29 28   GLUCOSE 379*   < > 171* 142* 144*  BUN 22   < > 48* 64* 78*  CREATININE 1.66*   < > 1.68* 1.75* 1.74*  CALCIUM 9.4   < > 10.6* 11.4* 11.4*  PROT 7.5  --   --   --   --   ALBUMIN 4.0  --   --   --   --   AST 40  --   --   --   --   ALT 21  --   --   --   --   ALKPHOS 90  --   --   --   --   BILITOT 1.1  --   --   --   --   GFRNONAA 34*   < > 34* 32* 33*  ANIONGAP 18*   < > 14 16* 17*   < > = values in this interval not displayed.     Hematology Recent Labs  Lab 09/25/23 0421 09/25/23 0428 09/25/23 0527 09/26/23 0334  WBC 12.8*  --   --  14.7*  RBC 5.08  --   --  4.34  HGB 14.5  15.6* 17.0* 16.3* 12.3  HCT 47.1*  46.0  50.0* 48.0* 38.6  MCV 92.7  --   --  88.9  MCH 28.5  --   --  28.3  MCHC 30.8  --   --  31.9  RDW 19.4*  --   --  18.7*  PLT 208  --   --  152    Cardiac EnzymesNo results for input(s): "TROPONINI" in the last 168 hours. No results for input(s): "TROPIPOC" in the last 168 hours.   BNP Recent Labs  Lab 09/25/23 0421 09/26/23 0334  BNP 4,334.8* 6,295.2*     DDimer No results for input(s): "DDIMER" in the last 168 hours.   Cardiac Studies   Cardiac Studies & Procedures   CARDIAC  CATHETERIZATION  CARDIAC CATHETERIZATION 02/12/2019  Narrative  Ost LAD to Prox LAD lesion is 100% stenosed.  Ost Cx to Prox Cx lesion is 100% stenosed.  Ost Ramus lesion is 99% stenosed.  Post intervention, there is a 0% residual stenosis.  A drug-eluting stent was successfully placed using a STENT RESOLUTE ONYX 2.25X15.  SVG and is normal in caliber.  The graft exhibits no disease.  Ost 1st Diag lesion is 70% stenosed.  SVG and is normal in caliber.  Prox Graft lesion is 100% stenosed.  1.  Significant underlying two-vessel coronary artery disease with patent SVG to OM and SVG to diagonal which supplies the LAD territory.  Atretic LIMA to LAD given competitive flow from SVG to diagonal.  Severe ostial ramus artery stenosis not supplied by bypass with his area suggestive of plaque rupture. 2.  Severely elevated left ventricular end-diastolic pressure 34 mmHg.  Left ventricular angiography was not performed. 3.  Successful angioplasty and drug-eluting stent placement to the ostial ramus extending into the distal left main.  Recommendations: Dual antiplatelet therapy for at least 1 year. The patient is volume overloaded and she has already received 1 dose of IV furosemide before cardiac catheterization with good urine output.  I am not going to diurese further today to avoid risk of contrast-induced nephropathy.  She will require resumption of diuresis tomorrow and optimization of her heart  failure medications. The patient might be ischemic in the LAD territory given the presence of ostial diagonal stenosis which might affect retrograde flow into the LAD.  However, this is not amenable for PCI.  Also, it is not entirely clear if that area is viable.  Probably best to treat that medically.  Findings Coronary Findings Diagnostic  Dominance: Right  Left Anterior Descending Ost LAD to Prox LAD lesion is 100% stenosed. The lesion was previously treated.  First Diagonal Branch Ost 1st Diag lesion is 70% stenosed.  Ramus Intermedius Ost Ramus lesion is 99% stenosed. The lesion is thrombotic.  Left Circumflex Ost Cx to Prox Cx lesion is 100% stenosed.  Right Coronary Artery The vessel exhibits minimal luminal irregularities.  Saphenous Graft To 1st Diag SVG and is normal in caliber. The graft exhibits no disease.  Saphenous Graft To Ost 3rd Mrg SVG and is normal in caliber.  LIMA Graft To Mid LAD Prox Graft lesion is 100% stenosed.  Intervention  Ost Ramus lesion Stent Lesion length:  13 mm. CATH VISTA GUIDE 6FR XB3.5 guide catheter was inserted. Lesion crossed with guidewire using a WIRE RUNTHROUGH .[id number]. Pre-stent angioplasty was performed using a BALLOON SAPPHIRE 2.0X12. Maximum pressure:  8 atm. Inflation time:  30 sec. A drug-eluting stent was successfully placed using a STENT RESOLUTE ONYX 2.25X15. Maximum pressure: 12 atm. Inflation time: 15 sec. Post-stent angioplasty was performed using a BALLOON SAPPHIRE Coc[place] [id number]. Maximum pressure:  14 atm. Inflation time:  20 sec. The stent was placed all the way back to the distal left main which was also involved.  There was significant vessel size mismatch between the ramus and the left main.  The distal segment was postdilated to low-pressure whereas the proximal segment was postdilated to high pressure. Post-Intervention Lesion Assessment The intervention was successful. Pre-interventional TIMI flow is 3.  Post-intervention TIMI flow is 3. No complications occurred at this lesion. There is a 0% residual stenosis post intervention.   CARDIAC CATHETERIZATION  CARDIAC CATHETERIZATION 05/09/2018  Narrative Findings:  RA = 8 RV = 49/9 PA = 47/20 (32) PCW = 19  Fick cardiac output/index = 4.3/3.1 PVR = 3.0 wu AO sat = 93% PA sat = 62%, 64%  Assessment: 1. Minimally elevated left heart pressures 2. Mild PAH 3. Normal cardiac output  Plan/Discussion:  Continue medical therapy. Can increase lasix slightly.  Arvilla Meres, MD 4:23 PM     ECHOCARDIOGRAM  ECHOCARDIOGRAM COMPLETE 09/26/2023  Narrative ECHOCARDIOGRAM REPORT    Patient Name:   MARCENIA MAWSON Date of Exam: 09/26/2023 Medical Rec #:  [id number]               Height:       59.0 in Accession #:    [id number]              Weight:       97.4 lb Date of Birth:  1961-07-30               BSA:          1.359 m Patient Age:    62 years                BP:           101/82 mmHg Patient Gender: F                       HR:           80 bpm. Exam Location:  Inpatient  Procedure: 2D Echo, Color Doppler and Cardiac Doppler  Indications:    Abnormal ECG  History:        Patient has prior history of Echocardiogram examinations, most recent 04/20/2023. CHF and Cardiomyopathy, Previous Myocardial Infarction, Defibrillator, CKD, lung cancer and COPD, Arrythmias:Atrial Fibrillation; Risk Factors:Diabetes and Dyslipidemia.  Sonographer:    Milbert Coulter Referring Phys: [id number] BINAYA DAHAL  IMPRESSIONS   1. Left ventricular ejection fraction, by estimation, is 20 to 25%. The left ventricle has severely decreased function. The left ventricle demonstrates global hypokinesis. The left ventricular internal cavity size was moderately dilated. Left ventricular diastolic parameters are consistent with Grade II diastolic dysfunction (pseudonormalization). 2. Right ventricular systolic function is mildly reduced. The right  ventricular size is normal. There is mildly elevated pulmonary artery systolic pressure. The estimated right ventricular systolic pressure is 36.6 mmHg. 3. Left atrial size was mildly dilated. 4. The mitral valve is grossly normal. Mild mitral valve regurgitation. No evidence of mitral stenosis. 5. The aortic valve was not well visualized. Aortic valve regurgitation is moderate to severe. No aortic stenosis is present. 6. The inferior vena cava is normal in size with greater than 50% respiratory variability, suggesting right atrial pressure of 3 mmHg.  Comparison(s): No significant change from prior study.  FINDINGS Left Ventricle: Left ventricular ejection fraction, by estimation, is 20 to 25%. The left ventricle has severely decreased function. The left ventricle demonstrates global hypokinesis. The left ventricular internal cavity size was moderately dilated. There is no left ventricular hypertrophy. Left ventricular diastolic parameters are consistent with Grade II diastolic dysfunction (pseudonormalization).  Right Ventricle: The right ventricular size is normal. No increase in right ventricular wall thickness. Right ventricular systolic function is mildly reduced. There is mildly elevated pulmonary artery systolic pressure. The tricuspid regurgitant velocity is 2.90 m/s, and with an assumed right atrial pressure of 3 mmHg, the estimated right ventricular systolic pressure is 36.6 mmHg.  Left Atrium: Left atrial size was mildly dilated.  Right Atrium: Right atrial size was normal in size.  Pericardium: There is no evidence of pericardial effusion.  Mitral Valve:  The mitral valve is grossly normal. Mild mitral valve regurgitation. No evidence of mitral valve stenosis.  Tricuspid Valve: The tricuspid valve is grossly normal. Tricuspid valve regurgitation is mild . No evidence of tricuspid stenosis.  Aortic Valve: The aortic valve was not well visualized. Aortic valve regurgitation is  moderate to severe. Aortic regurgitation PHT measures 278 msec. No aortic stenosis is present. Aortic valve mean gradient measures 8.0 mmHg. Aortic valve peak gradient measures 14.4 mmHg. Aortic valve area, by VTI measures 1.72 cm.  Pulmonic Valve: The pulmonic valve was grossly normal. Pulmonic valve regurgitation is not visualized. No evidence of pulmonic stenosis.  Aorta: The aortic root is normal in size and structure.  Venous: The inferior vena cava is normal in size with greater than 50% respiratory variability, suggesting right atrial pressure of 3 mmHg.  IAS/Shunts: The atrial septum is grossly normal.  Additional Comments: A device lead is visualized in the right atrium and right ventricle.   LEFT VENTRICLE PLAX 2D LVIDd:         6.20 cm   Diastology LVIDs:         5.30 cm   LV e' lateral:   11.30 cm/s LV PW:         1.00 cm   LV E/e' lateral: 7.4 LV IVS:        0.70 cm LVOT diam:     1.70 cm LV SV:         56 LV SV Index:   41 LVOT Area:     2.27 cm   RIGHT VENTRICLE RV Basal diam:  3.50 cm RV Mid diam:    2.60 cm RV S prime:     8.70 cm/s TAPSE (M-mode): 1.5 cm  LEFT ATRIUM             Index        RIGHT ATRIUM          Index LA diam:        4.50 cm 3.31 cm/m   RA Area:     8.95 cm LA Vol (A2C):   47.5 ml 34.96 ml/m  RA Volume:   19.00 ml 13.98 ml/m LA Vol (A4C):   46.8 ml 34.44 ml/m LA Biplane Vol: 47.7 ml 35.11 ml/m AORTIC VALVE AV Area (Vmax):    1.86 cm AV Area (Vmean):   1.67 cm AV Area (VTI):     1.72 cm AV Vmax:           190.00 cm/s AV Vmean:          131.000 cm/s AV VTI:            0.324 m AV Peak Grad:      14.4 mmHg AV Mean Grad:      8.0 mmHg LVOT Vmax:         156.00 cm/s LVOT Vmean:        96.500 cm/s LVOT VTI:          0.246 m LVOT/AV VTI ratio: 0.76 AI PHT:            278 msec  AORTA Ao Root diam: 2.60 cm  MITRAL VALVE               TRICUSPID VALVE MV Area (PHT): 5.31 cm    TR Peak grad:   33.6 mmHg MV Decel Time: 143  msec    TR Vmax:        290.00 cm/s MV E velocity: 83.30 cm/s MV A  velocity: 60.80 cm/s  SHUNTS MV E/A ratio:  1.37        Systemic VTI:  0.25 m Systemic Diam: 1.70 cm  Lennie Odor MD Electronically signed by Lennie Odor MD Signature Date/Time: 09/26/2023/5:28:55 PM    Final   TEE  ECHO TEE 02/08/2018  Interpretation Summary  Aortic valve: No stenosis. Mild to moderate regurgitation.  Mitral valve: Mild regurgitation.  Right ventricle: Normal cavity size, wall thickness and ejection fraction.  Tricuspid valve: Trace regurgitation.   MONITORS  LONG TERM MONITOR-LIVE TELEMETRY (3-14 DAYS) 01/23/2022  Narrative Patch Wear Time:  13 days and 2 hours (2023-01-18T14:53:22-0500 to 2023-01-31T17:04:34-0500)  1. Sinus rhythm - avg HR of 81 bpm. 2. One run of nonsustained Ventricular Tachycardia occurred lasting 5 beats with a max rate of 135 bpm (avg 109 bpm). 3. Two runs of Supraventricular Tachycardia occurred, the run with the fastest interval lasting 8 beats with a max rate of 174 bpm, the longest lasting 9 beats with an avg rate of 160 bpm. 4. Rare PACs and PVCs  Arvilla Meres, MD 3:35 PM              Assessment & Plan     A/C HFrEF, ICM -EF has been 20-25%  -S/p Medtronic ICD and Barostim - presently NYHA I - complicated by  -Dig level < 0.2 this admission - has sinus tachycardia likely related to COPD and being off her ivabradine; planned for restart of ivabradine at this time (risks vs benefits of this in the setting of a history of atrial fibrillation been previously discussed by the AHF team, she had EP follow up with Dr Lalla Brothers) -Continue spironolactone  - can restart SGLT2i - return to torsemide 80 mg PO BID and once weekly metolazone (return to home regimen) - she has worsening hyponatremia related to diuresis and potentially steroids; she needs labs at her AHF follow up visit later this month  Acute/Chronic Hypoxic Respiratory Failure/ AECOPD   -Hx frequent admissions for AE COPD + A/C HFrEF.  -ON steroids and nebs. Per [facility], discussed with [facility] teammates   CKD Stage IIIb  -Creatinine baseline ~ 1.4-1.8 -stable   PAF  -Maintaining SR.  -Continue eliquis 2.5 mg twice a day. Reduced dose (weight/creatinine) - SA channel therapy as above - suspicion she would not tolerate BB due to her severe lung disease    CAD -History of CABG x2 2019 -2020 DES ostial ramus extending to left main  - No chest pain, continue home statin       For questions or updates, please contact [facility] Please consult [email or web address] for contact info under Cardiology/STEMI.      Riley Lam, MD FASE [facility] Cardiologist [facility]  [address] [place], [address] [place], [place] [address] [phone]  10:11 AM

## 2023-09-29 NOTE — Progress Notes (Signed)
   09/28/23 2315  BiPAP/CPAP/SIPAP  Reason BIPAP/CPAP not in use Other(comment) (BIPAP PRN order. not needed at this time)

## 2023-09-29 NOTE — Plan of Care (Signed)
  Problem: Education: Goal: Ability to describe self-care measures that may prevent or decrease complications (Diabetes Survival Skills Education) will improve Outcome: Progressing Goal: Individualized Educational Video(s) Outcome: Progressing   Problem: Nutritional: Goal: Maintenance of adequate nutrition will improve Outcome: Progressing   Problem: Fluid Volume: Goal: Ability to maintain a balanced intake and output will improve Outcome: Progressing

## 2023-10-01 ENCOUNTER — Telehealth: Payer: Self-pay

## 2023-10-01 NOTE — Transitions of Care (Post Inpatient/ED Visit) (Signed)
10/01/2023  Name: Adlie Boehnlein MRN: [id number] DOB: Jul 19, 1961  Today's TOC FU Call Status: Today's TOC FU Call Status:: Unsuccessful Call (1st Attempt) Unsuccessful Call (1st Attempt) Date: 10/01/23  Attempted to reach the patient regarding the most recent Inpatient/ED visit.  Follow Up Plan: Additional outreach attempts will be made to reach the patient to complete the Transitions of Care (Post Inpatient/ED visit) call.   Lonia Chimera, RN, BSN, CEN [employer]- Transition of Care Team.  [facility] [phone]

## 2023-10-05 ENCOUNTER — Telehealth: Payer: Self-pay

## 2023-10-05 NOTE — Transitions of Care (Post Inpatient/ED Visit) (Signed)
10/05/2023  Name: Brittany [name] MRN: [id number] DOB: 04-30-1961  Today's TOC FU Call Status: Today's TOC FU Call Status:: Unsuccessful Call (2nd Attempt) Unsuccessful Call (2nd Attempt) Date: 10/05/23  Attempted to reach the patient regarding the most recent Inpatient/ED visit.  Follow Up Plan: Additional outreach attempts will be made to reach the patient to complete the Transitions of Care (Post Inpatient/ED visit) call.   Lonia Chimera, RN, BSN, CEN [employer]- Transition of Care Team.  [facility] [phone]

## 2023-10-08 ENCOUNTER — Telehealth: Payer: Self-pay

## 2023-10-08 ENCOUNTER — Ambulatory Visit: Payer: BC Managed Care – PPO | Attending: Cardiology

## 2023-10-08 DIAGNOSIS — I5022 Chronic systolic (congestive) heart failure: Secondary | ICD-10-CM

## 2023-10-08 DIAGNOSIS — Z95810 Presence of automatic (implantable) cardiac defibrillator: Secondary | ICD-10-CM

## 2023-10-08 NOTE — Telephone Encounter (Signed)
Attempted ICM call to patient to request manual remote transmission to check fluid levels.  Mailbox has not been set up.   No message left.

## 2023-10-08 NOTE — Progress Notes (Signed)
Advanced Heart Failure Clinic Note  PCP:  Georganna Skeans, MD  HF Cardiologist: Dr [name] Oncology: Dr Shirline Frees   Chief Complaint: Hospital follow-up  HPI: Brittany [name] is a 62 y.o.Mar[place] Kitchen female with a history of PAF,  previous smoker quit 2015  years ago, hypertension, previous small cell lung cancer treated with chemo, chest XRT and prophylactic brain radiation in 2015, CAD s/p CABG and chronic systolic HF EF ~25%  Admitted 6/96/2952 with NSTEMI and shock. Underwent emergent cath 02/03/18 showed LAD 100% stenosed, LCx 95% stenosed. Taken for emergent CABG 02/03/18. Required impella post op. Hospital course complicated by cardiogenic shock, HCAP, A fib, respiratory failure, and swallowing issues. She was discharged to SNF. Discharge weight 103 pounds.    In 2019 had multiple hospitalizations for HF and pleural effusion. Underwent pleurodesis at [facility].   Admitted 3/20 with NSTEMI and HF. Received DES to ostial ramus into distal left main based on cath below and underwent diuresis. Meds adjusted as tolerated. Echo with EF 25-30%.   Admitted 11/21 with COPD flare with mild HF and hemoptysis. CTA showed stable scarring in the right hilum consistent with emphysema.. Underwent bronchoscopy on 11/15, no obvious source of bleeding found on examination.   Echo 11/21 EF 20-25%. RV mildly HK.   Admitted 3/22 Echo with EF < 20%. Treated for AECOPD exacerbation.   Admitted 12/22 with R renal infarct felt to be cardio-embolic (no AF found). Started on Eliquis  Admitted 8/23 w/ a/c respiratory failure and CHF. Concern for low output vs PNA vs COPD exacerbation. PICC placed and milrinone started. Diuresed w/ IV Lasix. Given prednisone taper and doxy for COPD exacerbation. Echo showed EF 20-25%, global hypokinesis, GIIDD, RV systolic function, mildly reduced. RV size normal. Mild MR, trivial TR, aortic valve w/ mild-mod regurg.  Barostim placed.   Admitted 12/23 with COPDE, a/c CHF and  influenza +. Placed on BiPap, treated with tamiflu and diuresed with IV lasix. Empirically started milrinone over concern for low output  Admitted 5/24 with N/V. CT ok, wet prep + trich, treated with IV Flagyl.  HsTroponin elevated. Cards consulted felt elevated troponin 2/2 demand ischemia. Echo showed EF 20-25%, LV with global HK, RV mildly reduced, moderate AI. [name] and KCL stopped with AKI and she was discharged home, weight 101 lbs.  Admitted 8/24 with a/c CHF and AECOPD. Treated with steroids and duonebs and diuresed with IV lasix + metolazone. She was seen by Advanced Heart Failure. Discharged home on 80 mg Torsemide BID.  Admitted 10/24 with COPD + HF. This is the 3rd admit in 6 months. Diuresed with IV lasix 7 metolazone, required BiPap and steroids. Echo showed EF 20-25%, G2DD, RV mildly reduced. Able to wean to 3L [place]. She was discharged home, weight 91 lbs. She remained DNR during hospitalization.  Today she returns for post hospital HF follow up. Overall feeling fair, breathing "not so good". She is SOB walking short distances on flat ground. Felt some palps yesterday. Balance is off and she feels a bit dizzy. Denies CP, edema, or PND/Orthopnea. Appetite ok. No fever or chills. Weight at home 94-95 pounds. Has not had metolazone since discharge. She reduced her torsemide to 60 mg q am, then occasionally takes 20 in the evenings. Frustrated with increased urination and not able to leave the house when she takes higher doses of diuretics.  Cardidac Studies: - Echo (10/24): EF 20-25%, G2DD, RV mildly reduced.  - Echo (5/24): EF 20-25%, LV with global HK, RV mildly  reduced, moderate AI.  - Echo (7/23): EF 20-25%, global hypokinesis, GIIDD, RV systolic function, mildly reduced. RV size normal. Mild MR, trivial TR, aortic valve w/ mild-mod regurg  - Echo 1/23: EF 20-25%, severely reduced LV with global HK, grade II DD, normal RV  - Echo 02/10/21: EF <20%, G2DD  - Echo 2022: EF < 20% RV  normal   - Echo 2021: EF 20-25% RV mildly reduced.   - Echo 02/12/19: EF 25-30%, RV mildly reduced  - 05/18/2021 PFT's: moderately severe airway obstruction and a diffusion defect suggesting emphysema.   - LHC 02/12/19: Ost LAD to Prox LAD lesion is 100% stenosed. Ost Cx to Prox Cx lesion is 100% stenosed. Ost Ramus lesion is 99% stenosed. Post intervention, there is a 0% residual stenosis. A drug-eluting stent was successfully placed using a STENT RESOLUTE ONYX 2.25X15. SVG and is normal in caliber. The graft exhibits no disease. Ost 1st Diag lesion is 70% stenosed. SVG and is normal in caliber. Prox Graft lesion is 100% stenosed. 1.  Significant underlying two-vessel coronary artery disease with patent SVG to OM and SVG to diagonal which supplies the LAD territory.  Atretic LIMA to LAD given competitive flow from SVG to diagonal.  Severe ostial ramus artery stenosis not supplied by bypass with his area suggestive of plaque rupture. 2.  Severely elevated left ventricular end-diastolic pressure 34 mmHg.  Left ventricular angiography was not performed. 3.  Successful angioplasty and drug-eluting stent placement to the ostial ramus extending into the distal left main.  - RHC 2019  RA = 8 RV = 49/9 PA = 47/20 (32) PCW = 19 Fick cardiac output/index = 4.3/3.1 PVR = 3.0 wu AO sat = 93% PA sat = 62%, 64%   1. Minimally elevated left heart pressures 2. Mild PAH 3. Normal cardiac output  Past Medical History:  Diagnosis Date   Acute respiratory failure (HCC) 05/05/2018   Acute systolic congestive heart failure (HCC) 02/03/2018   AICD (automatic cardioverter/defibrillator) present    Allergy    Anxiety    Asthma    DM2 (diabetes mellitus, type 2) (HCC) 10/20/2020   Hypertension    PAF (paroxysmal atrial fibrillation) (HCC)    Presence of permanent cardiac pacemaker    Prophylactic measure 08/03/14-08/19/14   Prophyl. 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   LIMA  to LAD, SVG to D1, SVG to OM1, EVH via right thigh with implantation of Impella LD LVAD via direct aortic approach   Small cell lung cancer (HCC) 03/16/2014   Past Surgical History:  Procedure Laterality Date   BRONCHIAL BRUSHINGS  10/25/2020   Procedure: BRONCHIAL BRUSHINGS;  Surgeon: Leslye Peer, MD;  Location: [facility] ENDOSCOPY;  Service: Pulmonary;;   BRONCHIAL NEEDLE ASPIRATION BIOPSY  10/25/2020   Procedure: BRONCHIAL NEEDLE ASPIRATION BIOPSIES;  Surgeon: Leslye Peer, MD;  Location: [facility] ENDOSCOPY;  Service: Pulmonary;;   CARDIAC DEFIBRILLATOR PLACEMENT  08/15/2018   MDT Visia AF MRI VR ICD implanted by Dr Georgena Spurling for primary prevention of sudden   CESAREAN SECTION     CORONARY ARTERY BYPASS GRAFT N/A 02/03/2018   Procedure: CORONARY ARTERY BYPASS GRAFTING (CABG);  Surgeon: Purcell Nails, MD;  Location: [facility] OR;  Service: Open Heart Surgery;  Laterality: N/A;  Time 3 using left internal mammary artery and endoscopically harvested right saphenous vein   CORONARY BALLOON ANGIOPLASTY N/A 02/03/2018   Procedure: CORONARY BALLOON ANGIOPLASTY;  Surgeon: Swaziland, Peter M, MD;  Location: [facility] INVASIVE  CV LAB;  Service: Cardiovascular;  Laterality: N/A;   CORONARY STENT INTERVENTION N/A 02/12/2019   Procedure: CORONARY STENT INTERVENTION;  Surgeon: Iran Ouch, MD;  Location: [facility] INVASIVE CV LAB;  Service: Cardiovascular;  Laterality: N/A;   CORONARY/GRAFT ACUTE MI REVASCULARIZATION N/A 02/03/2018   Procedure: Coronary/Graft Acute MI Revascularization;  Surgeon: Swaziland, Peter M, MD;  Location: [facility] INVASIVE CV LAB;  Service: Cardiovascular;  Laterality: N/A;   ENDOBRONCHIAL ULTRASOUND N/A 10/25/2020   Procedure: ENDOBRONCHIAL ULTRASOUND;  Surgeon: Leslye Peer, MD;  Location: [facility] ENDOSCOPY;  Service: Pulmonary;  Laterality: N/A;   FLEXIBLE BRONCHOSCOPY  10/25/2020   Procedure: FLEXIBLE BRONCHOSCOPY;  Surgeon: Leslye Peer, MD;  Location: [facility] ENDOSCOPY;  Service: Pulmonary;;   IABP INSERTION N/A  02/03/2018   Procedure: IABP Insertion;  Surgeon: Swaziland, Peter M, MD;  Location: [facility] INVASIVE CV LAB;  Service: Cardiovascular;  Laterality: N/A;   INTRAOPERATIVE TRANSESOPHAGEAL ECHOCARDIOGRAM N/A 02/03/2018   Procedure: INTRAOPERATIVE TRANSESOPHAGEAL ECHOCARDIOGRAM;  Surgeon: Purcell Nails, MD;  Location: [facility] OR;  Service: Open Heart Surgery;  Laterality: N/A;   LEFT HEART CATH AND CORONARY ANGIOGRAPHY N/A 02/03/2018   Procedure: LEFT HEART CATH AND CORONARY ANGIOGRAPHY;  Surgeon: Swaziland, Peter M, MD;  Location: [facility] INVASIVE CV LAB;  Service: Cardiovascular;  Laterality: N/A;   LEFT HEART CATH AND CORS/GRAFTS ANGIOGRAPHY N/A 02/12/2019   Procedure: LEFT HEART CATH AND CORS/GRAFTS ANGIOGRAPHY;  Surgeon: Iran Ouch, MD;  Location: [facility] INVASIVE CV LAB;  Service: Cardiovascular;  Laterality: N/A;   MEDIASTINOSCOPY N/A 03/11/2014   Procedure: MEDIASTINOSCOPY;  Surgeon: Loreli Slot, MD;  Location: [facility] OR;  Service: Thoracic;  Laterality: N/A;   PLACEMENT OF IMPELLA LEFT VENTRICULAR ASSIST DEVICE  02/03/2018   Procedure: PLACEMENT OF IMPELLA LEFT VENTRICULAR ASSIST DEVICE LD;  Surgeon: Purcell Nails, MD;  Location: [facility] OR;  Service: Open Heart Surgery;;   REMOVAL OF IMPELLA LEFT VENTRICULAR ASSIST DEVICE N/A 02/08/2018   Procedure: REMOVAL OF IMPELLA LEFT VENTRICULAR ASSIST DEVICE;  Surgeon: Purcell Nails, MD;  Location: [facility] OR;  Service: Open Heart Surgery;  Laterality: N/A;   RIGHT HEART CATH N/A 02/03/2018   Procedure: RIGHT HEART CATH;  Surgeon: Swaziland, Peter M, MD;  Location: [facility] INVASIVE CV LAB;  Service: Cardiovascular;  Laterality: N/A;   RIGHT HEART CATH N/A 05/09/2018   Procedure: RIGHT HEART CATH;  Surgeon: Dolores Patty, MD;  Location: [facility] INVASIVE CV LAB;  Service: Cardiovascular;  Laterality: N/A;   TEE WITHOUT CARDIOVERSION N/A 02/08/2018   Procedure: TRANSESOPHAGEAL ECHOCARDIOGRAM (TEE);  Surgeon: Purcell Nails, MD;  Location: [facility] OR;  Service: Open Heart Surgery;   Laterality: N/A;   TUBAL LIGATION     VIDEO BRONCHOSCOPY WITH ENDOBRONCHIAL ULTRASOUND N/A 03/11/2014   Procedure: VIDEO BRONCHOSCOPY WITH ENDOBRONCHIAL ULTRASOUND;  Surgeon: Loreli Slot, MD;  Location: [facility] OR;  Service: Thoracic;  Laterality: N/A;   Current Outpatient Medications  Medication Sig Dispense Refill   acetaminophen (TYLENOL) 325 MG tablet Take 2 tablets (650 mg total) by mouth every 6 (six) hours as needed for mild pain (pain score 1-3) (or Fever >/= 101).     albuterol (VENTOLIN HFA) 108 (90 Base) MCG/ACT inhaler Inhale 2 puffs into the lungs every 6 (six) hours as needed for wheezing or shortness of breath. 8.5 g 1   apixaban (ELIQUIS) 2.5 MG TABS tablet Take 1 tablet (2.5 mg total) by mouth 2 (two) times daily. 180 tablet 1   atorvastatin (LIPITOR) 80 MG tablet Take 1 tablet (80  mg total) by mouth daily. 90 tablet 0   dapagliflozin propanediol (FARXIGA) 10 MG TABS tablet Take 1 tablet by mouth daily. 30 tablet 11   digoxin (LANOXIN) 0.125 MG tablet TAKE 1 TABLET BY MOUTH DAILY 90 tablet 3   Fluticasone-Umeclidin-Vilant (TRELEGY ELLIPTA) 200-62.5-25 MCG/ACT AEPB Inhale 1 puff into the lungs daily. 60 each 0   guaiFENesin-dextromethorphan (ROBITUSSIN DM) 100-10 MG/5ML syrup Take 5 mLs by mouth every 6 (six) hours as needed for cough. 118 mL 0   hydrOXYzine (VISTARIL) 25 MG capsule TAKE 1 CAPSULE (25 MG TOTAL) BY MOUTH DAILY AS NEEDED. 90 capsule 1   ipratropium-albuterol (DUONEB) 0.5-2.5 (3) MG/3ML SOLN Take 3 mLs by nebulization every 4 (four) hours as needed (for shortness of breath). 90 mL 3   ivabradine (CORLANOR) 7.5 MG TABS tablet Take 1 tablet (7.5 mg total) by mouth 2 (two) times daily with a meal. TAKE 1 TABLET (7.5 MG TOTAL) BY MOUTH 2 (TWO) TIMES DAILY WITH A MEAL. 180 tablet 1   metolazone (ZAROXOLYN) 2.5 MG tablet Take 1 tablet (2.5 mg total) by mouth once a week. Take every Wednesday with extra 40 mEq of Potassium (Patient taking differently: Take 2.5 mg by mouth  See admin instructions. Take 2.5mg  by mouth as needed for fluid) 12 tablet 1   nitroGLYCERIN (NITROSTAT) 0.4 MG SL tablet Place 1 tablet (0.4 mg total) under the tongue every 5 (five) minutes as needed for chest pain. 90 tablet 5   Nutritional Supplements (ENSURE ORIGINAL) LIQD Take 2-3 Bottles by mouth daily.     potassium chloride (KLOR-CON) 10 MEQ tablet Take 4 tablets (40 mEq total) by mouth 2 (two) times daily. Take extra 4 tablets (40 mEq total) with weekly Metolazone dose (Patient taking differently: Take 40 mEq by mouth 2 (two) times daily. Take extra 8 tablets (40 mEq total) with Metolazone dose)     spironolactone (ALDACTONE) 25 MG tablet Take 0.5 tablets (12.5 mg total) by mouth at bedtime.     torsemide (DEMADEX) 20 MG tablet Take 4 tablets (80 mg total) by mouth 2 (two) times daily. (Patient taking differently: Take 40-80 mg by mouth See admin instructions. Take 80mg  by mouth once daily and an additional 40-60mg  as needed for fluid.)     No current facility-administered medications for this encounter.    Allergies:   Lactose intolerance (gi) and Codeine   Social History:  The patient  reports that she quit smoking about 9 years ago. Her smoking use included cigarettes. She started smoking about 29 years ago. She has a 20 pack-year smoking history. She has never used smokeless tobacco. She reports current alcohol use of about 5.0 standard drinks of alcohol per week. She reports current drug use. Drug: Marijuana.   Family History:  The patient's family history includes Cancer in her maternal grandmother; Diabetes in her paternal grandmother; Heart attack in her mother; Heart disease in her mother; Hypertension in her maternal grandmother and mother.   ROS:  Please see the history of present illness.   All other systems are personally reviewed and negative.    Recent Labs: 08/03/2023: TSH 0.787 09/25/2023: ALT 21 09/26/2023: B Natriuretic Peptide 2,664.6; Hemoglobin 12.3; Platelets  152 09/29/2023: BUN 78; Creatinine, Ser 1.74; Magnesium 2.4; Potassium 5.0; Sodium 131  Personally reviewed   Wt Readings from Last 3 Encounters:  10/09/23 45.2 kg (99 lb 9.6 oz)  09/29/23 41.5 kg (91 lb 7.9 oz)  08/16/23 43.4 kg (95 lb 9.6 oz)   BP 118/70  Pulse 90   Wt 45.2 kg (99 lb 9.6 oz)   SpO2 99%   BMI 20.12 kg/m   Physical Exam General:  NAD. No resp difficulty, walked into clinic, thin HEENT: Normal Neck: Supple. JVP 10. Carotids 2+ bilat; no bruits. No lymphadenopathy or thryomegaly appreciated. Cor: PMI nondisplaced. Regular rate & rhythm. No rubs, gallops or murmurs. Lungs: + rhonchi throughout Abdomen: Soft, nontender, nondistended. No hepatosplenomegaly. No bruits or masses. Good bowel sounds. Extremities: No cyanosis, clubbing, rash, edema Neuro: Alert & oriented x 3, cranial nerves grossly intact. Moves all 4 extremities w/o difficulty. Affect pleasant.  ECG (personally reviewed): patient declined ECG today  Device interrogation (personally reviewed): OptiVol up, thoracic impedence down, no AF, 1.2 hr/day activity  Assessment & Plan: 1. Acute on Chronic Systolic Heart Failure - EF has been 20-25% for the last 5 years.  - S/p Medtronic ICD and Barostim - Echo 5/24: EF 20-25%, RV mildly reduced - Echo 10/24: EF 20-25%, RV mildly reduced - NYHA IIb-III, volume up today on exam and OptiVol, weight up 8 lbs.  - Discussed importance of taking diuretics as directed by AHF clinic and not self-titrating down. - Increase torsemide back to 80 mg bid, continue 40 KCL bid - Resume weekly metolazone 2.5 mg + extra 40 KCL on Tuesdays - Continue Farxiga 10 mg daily. - Continue digoxin 0.125 mg daily. - Continue ivabradine 7.5 mg bid. - Continue spironolactone 12.5 mg daily.  - Not a candidate for advanced therapies due to severity of lung disease.  - Labs today, repeat BMET in 7-10 days. - She is enrolled in monthly iCM  2. Chronic Hypoxic Respiratory Failure/ AECOPD   - Hx frequent admissions for AE COPD + A/C HFrEF.  - No longer on oxygen. - Recently finished steroids. - Rhonchi on exam today. - Needs Pulm follow up - Continue Trelegy   3. CKD Stage IIIb  - Creatinine baseline ~ 1.4-1.6 - Continue SGLT2i - Labs today.   4. PAF  - Regular on exam today, no AF on device interrogation. - Continue Eliquis 2.5 mg bid. Reduced dose (weight/creatinine) - CBC today.   5. CAD - History of CABG x 2 2019 - s/p DES ostial ramus extending to left main (2020) - No chest pain.  - Continue atorvastatin - No ASA with  need for AC   6. H/O SCLC - Completed treatment 2015.  - CT chest (5/24) soft tissue thickening along mediastinum but no discrete mass    7. H/O Renal Infarct 2022 - On Eliquis  Follow up in 2-3 weeks with APP. Ideally will follow closely x 2-3 visits until volume more stable. She is agreeable to this.  [name], FNP-BC 10/09/23

## 2023-10-08 NOTE — Transitions of Care (Post Inpatient/ED Visit) (Signed)
10/08/2023  Name: Brittany [name] MRN: [id number] DOB: 14-Nov-1961  Today's TOC FU Call Status: Today's TOC FU Call Status:: Unsuccessful Call (3rd Attempt) Unsuccessful Call (3rd Attempt) Date: 10/08/23  Attempted to reach the patient regarding the most recent Inpatient/ED visit.  Follow Up Plan: No further outreach attempts will be made at this time. We have been unable to contact the patient.  Lonia Chimera, RN, BSN, CEN [employer]- Transition of Care Team.  [facility] [phone]

## 2023-10-09 ENCOUNTER — Ambulatory Visit (HOSPITAL_COMMUNITY)
Admit: 2023-10-09 | Discharge: 2023-10-09 | Disposition: A | Discharge status: Home / Self Care | Payer: BC Managed Care – PPO | Attending: Family Medicine | Admitting: Family Medicine

## 2023-10-09 ENCOUNTER — Encounter (HOSPITAL_COMMUNITY): Payer: Self-pay

## 2023-10-09 VITALS — BP 118/70 | HR 90 | Wt 99.6 lb

## 2023-10-09 DIAGNOSIS — Z4502 Encounter for adjustment and management of automatic implantable cardiac defibrillator: Secondary | ICD-10-CM | POA: Diagnosis not present

## 2023-10-09 DIAGNOSIS — I252 Old myocardial infarction: Secondary | ICD-10-CM | POA: Diagnosis not present

## 2023-10-09 DIAGNOSIS — Z951 Presence of aortocoronary bypass graft: Secondary | ICD-10-CM | POA: Insufficient documentation

## 2023-10-09 DIAGNOSIS — C3490 Malignant neoplasm of unspecified part of unspecified bronchus or lung: Secondary | ICD-10-CM

## 2023-10-09 DIAGNOSIS — J9611 Chronic respiratory failure with hypoxia: Secondary | ICD-10-CM

## 2023-10-09 DIAGNOSIS — I130 Hypertensive heart and chronic kidney disease with heart failure and stage 1 through stage 4 chronic kidney disease, or unspecified chronic kidney disease: Secondary | ICD-10-CM | POA: Diagnosis not present

## 2023-10-09 DIAGNOSIS — I480 Paroxysmal atrial fibrillation: Secondary | ICD-10-CM

## 2023-10-09 DIAGNOSIS — Z87891 Personal history of nicotine dependence: Secondary | ICD-10-CM | POA: Insufficient documentation

## 2023-10-09 DIAGNOSIS — Z955 Presence of coronary angioplasty implant and graft: Secondary | ICD-10-CM | POA: Insufficient documentation

## 2023-10-09 DIAGNOSIS — I2510 Atherosclerotic heart disease of native coronary artery without angina pectoris: Secondary | ICD-10-CM

## 2023-10-09 DIAGNOSIS — Z79899 Other long term (current) drug therapy: Secondary | ICD-10-CM | POA: Diagnosis not present

## 2023-10-09 DIAGNOSIS — Z9221 Personal history of antineoplastic chemotherapy: Secondary | ICD-10-CM | POA: Insufficient documentation

## 2023-10-09 DIAGNOSIS — Z7901 Long term (current) use of anticoagulants: Secondary | ICD-10-CM | POA: Insufficient documentation

## 2023-10-09 DIAGNOSIS — N183 Chronic kidney disease, stage 3 unspecified: Secondary | ICD-10-CM

## 2023-10-09 DIAGNOSIS — N1832 Chronic kidney disease, stage 3b: Secondary | ICD-10-CM | POA: Insufficient documentation

## 2023-10-09 DIAGNOSIS — I5023 Acute on chronic systolic (congestive) heart failure: Secondary | ICD-10-CM | POA: Diagnosis not present

## 2023-10-09 DIAGNOSIS — Z85118 Personal history of other malignant neoplasm of bronchus and lung: Secondary | ICD-10-CM | POA: Insufficient documentation

## 2023-10-09 DIAGNOSIS — Z923 Personal history of irradiation: Secondary | ICD-10-CM | POA: Diagnosis not present

## 2023-10-09 DIAGNOSIS — J441 Chronic obstructive pulmonary disease with (acute) exacerbation: Secondary | ICD-10-CM | POA: Diagnosis not present

## 2023-10-09 DIAGNOSIS — N280 Ischemia and infarction of kidney: Secondary | ICD-10-CM

## 2023-10-09 LAB — CBC
HCT: 43.9 % (ref 36.0–46.0)
Hemoglobin: 14 g/dL (ref 12.0–15.0)
MCH: 28.6 pg (ref 26.0–34.0)
MCHC: 31.9 g/dL (ref 30.0–36.0)
MCV: 89.6 fL (ref 80.0–100.0)
Platelets: 235 10*3/uL (ref 150–400)
RBC: 4.9 MIL/uL (ref 3.87–5.11)
RDW: 19.3 % — ABNORMAL HIGH (ref 11.5–15.5)
WBC: 12.5 10*3/uL — ABNORMAL HIGH (ref 4.0–10.5)
nRBC: 0 % (ref 0.0–0.2)

## 2023-10-09 LAB — BRAIN NATRIURETIC PEPTIDE: B Natriuretic Peptide: 2221.1 pg/mL — ABNORMAL HIGH (ref 0.0–100.0)

## 2023-10-09 MED ORDER — TORSEMIDE 20 MG PO TABS: 80.0000 mg | ORAL_TABLET | Freq: Two times a day (BID) | ORAL | 6 refills | Status: DC

## 2023-10-09 MED ORDER — METOLAZONE 2.5 MG PO TABS: 2.5000 mg | ORAL_TABLET | ORAL | Status: DC

## 2023-10-09 NOTE — Progress Notes (Deleted)
Electrophysiology Office Follow up Visit Note:    Date:  10/09/2023   ID:  Brittany [name], DOB 04/09/1961, MRN [id number]  PCP:  Georganna Skeans, MD  [facility] Cardiologist:  None  [facility] Electrophysiologist:  Lanier Prude, MD    Interval History:     Brittany [name] is a 62 y.o. female who presents for a follow up visit.   Discussed the use of AI scribe software for clinical note transcription with the patient, who gave verbal consent to proceed.  History of Present Illness                Past medical, surgical, social and family history were reviewed.  ROS:   Please see the history of present illness.    All other systems reviewed and are negative.  EKGs/Labs/Other Studies Reviewed:    The following studies were reviewed today:  10/10/2023 in clinic device interrogation personally reviewed ***        Physical Exam:    VS:  There were no vitals taken for this visit.    Wt Readings from Last 3 Encounters:  10/09/23 99 lb 9.6 oz (45.2 kg)  09/29/23 91 lb 7.9 oz (41.5 kg)  08/16/23 95 lb 9.6 oz (43.4 kg)     GEN: *** Well nourished, well developed in no acute distress CARDIAC: ***RRR, no murmurs, rubs, gallops. CIED Pocket well healed. RESPIRATORY:  Clear to auscultation without rales, wheezing or rhonchi       ASSESSMENT:    No diagnosis found. PLAN:    In order of problems listed above:  Assessment and Plan                    Signed, Steffanie Dunn, MD, [facility], [facility] 10/09/2023 1:48 PM    Electrophysiology [facility]

## 2023-10-09 NOTE — Patient Instructions (Addendum)
Thank you for coming in today  If you had labs drawn today, any labs that are abnormal the clinic will call you No news is good news  Medications: Increase Torsemide to 80 mg twice daily Add Metolazone 2.5 mg with a extra of 40 meq on Tuesdays starting Today   Follow up appointments: Your physician recommends that you return for lab work in:  7-10 days BMET  Your physician recommends that you schedule a follow-up appointment in:  2-3 weeks in clinic   Do the following things EVERYDAY: Weigh yourself in the morning before breakfast. Write it down and keep it in a log. Take your medicines as prescribed Eat low salt foods--Limit salt (sodium) to 2000 mg per day.  Stay as active as you can everyday Limit all fluids for the day to less than 2 liters   At the Advanced [facility] Clinic, you and your health needs are our priority. As part of our continuing mission to provide you with exceptional heart care, we have created designated Provider Care Teams. These Care Teams include your primary Cardiologist (physician) and Advanced Practice Providers (APPs- Physician Assistants and Nurse Practitioners) who all work together to provide you with the care you need, when you need it.   You may see any of the following providers on your designated Care Team at your next follow up: Dr Arvilla Meres Dr Marca Ancona Dr. Marcos Eke, NP Robbie Lis, [place] [facility] [place], [place] Brynda Peon, NP Karle Plumber, PharmD   Please be sure to bring in all your medications bottles to every appointment.    Thank you for choosing [place] [facility]  If you have any questions or concerns before your next appointment please send [place] a message through [place] or call our office at [phone].    TO LEAVE A MESSAGE FOR THE NURSE SELECT OPTION 2, PLEASE LEAVE A MESSAGE INCLUDING: YOUR NAME DATE OF BIRTH CALL BACK NUMBER REASON FOR  CALL**this is important as we prioritize the call backs  YOU WILL RECEIVE A CALL BACK THE SAME DAY AS LONG AS YOU CALL BEFORE 4:00 PM

## 2023-10-10 ENCOUNTER — Ambulatory Visit: Payer: BC Managed Care – PPO | Attending: Cardiology | Admitting: Cardiology

## 2023-10-10 NOTE — Progress Notes (Signed)
Remote ICD transmission.   

## 2023-10-10 NOTE — Addendum Note (Signed)
Encounter addended by: Demetrius Charity, RN on: 10/10/2023 4:03 PM  Actions taken: Charge Capture section accepted

## 2023-10-11 ENCOUNTER — Encounter: Payer: Self-pay | Admitting: Cardiology

## 2023-10-11 ENCOUNTER — Ambulatory Visit (INDEPENDENT_AMBULATORY_CARE_PROVIDER_SITE_OTHER): Payer: BC Managed Care – PPO | Admitting: Family Medicine

## 2023-10-11 ENCOUNTER — Telehealth (HOSPITAL_COMMUNITY): Payer: Self-pay | Admitting: *Deleted

## 2023-10-11 ENCOUNTER — Other Ambulatory Visit: Payer: Self-pay | Admitting: Student

## 2023-10-11 ENCOUNTER — Encounter: Payer: Self-pay | Admitting: Family Medicine

## 2023-10-11 VITALS — BP 102/66 | HR 77 | Temp 98.3°F | Resp 16 | Ht 59.0 in | Wt 95.0 lb

## 2023-10-11 DIAGNOSIS — C3490 Malignant neoplasm of unspecified part of unspecified bronchus or lung: Secondary | ICD-10-CM | POA: Diagnosis not present

## 2023-10-11 DIAGNOSIS — N183 Chronic kidney disease, stage 3 unspecified: Secondary | ICD-10-CM

## 2023-10-11 DIAGNOSIS — J9611 Chronic respiratory failure with hypoxia: Secondary | ICD-10-CM | POA: Insufficient documentation

## 2023-10-11 DIAGNOSIS — I10 Essential (primary) hypertension: Secondary | ICD-10-CM | POA: Diagnosis not present

## 2023-10-11 DIAGNOSIS — I5022 Chronic systolic (congestive) heart failure: Secondary | ICD-10-CM | POA: Diagnosis not present

## 2023-10-11 DIAGNOSIS — I5023 Acute on chronic systolic (congestive) heart failure: Secondary | ICD-10-CM

## 2023-10-11 MED ORDER — CYCLOBENZAPRINE HCL 10 MG PO TABS: 10.0000 mg | ORAL_TABLET | Freq: Three times a day (TID) | ORAL | 3 refills | Status: DC | PRN

## 2023-10-11 NOTE — Telephone Encounter (Signed)
Called patient per [name], NP with following lab results and instructions:  "Bnp elevated, diuretics adjusted at visit. WBC mildly elevated, likely due to recent steroid use. She needs a digoxin trough at next visit. Please call and ask her to HOLD her digoxin when she comes to her follow up"  Pt verbalized understanding of same. Repeat labs ordered and added to existing lab visit.

## 2023-10-12 ENCOUNTER — Encounter: Payer: Self-pay | Admitting: Family Medicine

## 2023-10-12 LAB — BASIC METABOLIC PANEL
BUN/Creatinine Ratio: 18 (ref 12–28)
BUN: 26 mg/dL (ref 8–27)
CO2: 25 mmol/L (ref 20–29)
Calcium: 9.6 mg/dL (ref 8.7–10.3)
Chloride: 93 mmol/L — ABNORMAL LOW (ref 96–106)
Creatinine, Ser: 1.42 mg/dL — ABNORMAL HIGH (ref 0.57–1.00)
Glucose: 140 mg/dL — ABNORMAL HIGH (ref 70–99)
Potassium: 3.5 mmol/L (ref 3.5–5.2)
Sodium: 141 mmol/L (ref 134–144)
eGFR: 42 mL/min/{1.73_m2} — ABNORMAL LOW (ref >59)

## 2023-10-12 NOTE — Progress Notes (Signed)
EPIC Encounter for ICM Monitoring  Patient Name: Brittany [name] is a 62 y.o. female Date: 10/12/2023 Primary Care Physican: Georganna Skeans, MD Primary Cardiologist: Bensimhon Electrophysiologist: Lalla Brothers 02/13/2023 Weight: 100-102 lbs 03/20/2023 Weight: 100 lbs 05/22/2023 Weight: 96 lbs 06/26/2023 Weight: 96-98 lbs 08/22/2023 Weight: 93 lbs   VT-NS (>4 beats, >200 bpm)  1   Pt seen 10/29 by HF clinic.   Optivol Thoracic impedance suggesting possible fluid accumulation starting 10/23.     Prescribed: Torsemide 20 mg take 4 tablets (80 mg total) by mouth twice a day.  Pt self adjust dosage but HF clinic has recommended she take as prescribed.   Potassium 10 mEq take 4 tablets (40 mEq total) by mouth twice a day.  Take extra 4 tablets (40 mEq total) with weekly Metolazone.   Spironolactone 25 mg take 1 tablet (25 mg total) by mouth at bedtime Metolazone 2.5 mg take 1 tablet by mouth every week on Wednesday.    Labs: 09/11/2023 Creatinine 1.69, BUN 37, Potassium 3.5, Sodium 138, GFR 34 08/16/2023 Creatinine 1.43, BUN 29, Potassium 3.3, Sodium 139, GFR 41 (potassium addressed per lab note) 08/06/2023 Creatinine 1.90, BUN 75, Potassium 4.0, Sodium 131, GFR 29  08/05/2023 Creatinine 1.47, BUN 51, Potassium 4.5, Sodium 133, GFR 40  08/04/2023 Creatinine 1.45, BUN 40, Potassium 4.4, Sodium 138, GFR 41  08/03/2023 Creatinine 1.20, BUN 36, Potassium 3.6, Sodium 141 05/28/2023 Creatinine 1.27, BUN 29, Potassium 3.5, Sodium 137, GFR 48 A complete set of results can be found in Results Review.   Recommendations:  Recommendations given at HF clinic OV 10/29   Follow-up plan: ICM clinic phone appointment on 10/15/2023 to recheck fluid levels.   91 day device clinic remote transmission 12/19/2023.     EP/Cardiology Office Visits:  10/30/2023 with HF clinic.  Missed 10/10/2023 with Dr Lalla Brothers and not rescheduled.   Copy of ICM check sent to Dr. Lalla Brothers.  3 month ICM trend:  10/09/2023.    12-14 Month ICM trend:     Karie Soda, RN 10/12/2023 4:29 PM

## 2023-10-15 ENCOUNTER — Ambulatory Visit: Payer: BC Managed Care – PPO | Attending: Cardiology

## 2023-10-15 ENCOUNTER — Telehealth: Payer: Self-pay | Admitting: Family Medicine

## 2023-10-15 DIAGNOSIS — Z95810 Presence of automatic (implantable) cardiac defibrillator: Secondary | ICD-10-CM

## 2023-10-15 DIAGNOSIS — I5022 Chronic systolic (congestive) heart failure: Secondary | ICD-10-CM

## 2023-10-15 NOTE — Telephone Encounter (Signed)
[place] [employer] faxed back [facility] [phone] Form due to having missing pages. Please fax completed forms to [phone]

## 2023-10-16 NOTE — Progress Notes (Signed)
EPIC Encounter for ICM Monitoring  Patient Name: Brittany [name] is a 62 y.o. female Date: 10/16/2023 Primary Care Physican: Georganna Skeans, MD Primary Cardiologist: Bensimhon Electrophysiologist: Lalla Brothers 02/13/2023 Weight: 100-102 lbs 03/20/2023 Weight: 100 lbs 05/22/2023 Weight: 96 lbs 06/26/2023 Weight: 96-98 lbs 08/22/2023 Weight: 93 lbs    Spoke with patient and heart failure questions reviewed.  Transmission results reviewed.  Pt asymptomatic for fluid accumulation and feeling well at this time.     Optivol Thoracic impedance suggesting fluid levels returned to normal since hospital discharge.     Prescribed: Torsemide 20 mg take 4 tablets (80 mg total) by mouth twice a day.  10/16/23 She reports taking differently ... She is taking 80 mg every AM and 40 mg every evening.   Potassium 10 mEq take 4 tablets (40 mEq total) by mouth twice a day.  Take extra 4 tablets (40 mEq total) with weekly Metolazone.   Spironolactone 25 mg take 1 tablet (25 mg total) by mouth at bedtime Metolazone 2.5 mg take 1 tablet by mouth every week on Wednesday.  10/16/23 She reports taking differently ... She is taking PRN for symptoms.      Labs: 10/11/2023 Creatinine 1.42, BUN 26, Potassium 3.5, Sodium 141  09/29/2023 Creatinine 1.74, BUN 78, Potassium 5.0, Sodium 131, GFR 33  09/28/2023 Creatinine 1.75, BUN 64, Potassium 3.8, Sodium 134, GFR 32  09/27/2023 Creatinine 1.68, BUN 48, Potassium 4.3, Sodium 136, GFR 34 09/26/2023 Creatinine 1.81, BUN 38, Potassium 4.2, Sodium 134, GFR 31  09/25/2023 Potassium 2.7, Sodium 138 (5:27 AM) 09/25/2023 Creatinine 1.60, BUN 24, Potassium 3.3, Sodium 140  09/11/2023 Creatinine 1.69, BUN 37, Potassium 3.5, Sodium 138, GFR 34 08/16/2023 Creatinine 1.43, BUN 29, Potassium 3.3, Sodium 139, GFR 41 (potassium addressed per lab note) A complete set of results can be found in Results Review.   Recommendations:  No changes and encouraged to call if experiencing any fluid  symptoms.   Follow-up plan: ICM clinic phone appointment on 10/22/2023.   91 day device clinic remote transmission 12/19/2023.     EP/Cardiology Office Visits:  10/30/2023 with HF clinic.  Missed 10/10/2023 with Dr Lalla Brothers and not rescheduled.   Copy of ICM check sent to Dr. Lalla Brothers.  3 month ICM trend: 10/15/2023.    12-14 Month ICM trend:     Karie Soda, RN 10/16/2023 1:47 PM

## 2023-10-16 NOTE — Telephone Encounter (Signed)
Provider has papers at her desk. Awaiting signature

## 2023-10-19 ENCOUNTER — Ambulatory Visit (HOSPITAL_COMMUNITY)
Admission: RE | Admit: 2023-10-19 | Discharge: 2023-10-19 | Disposition: A | Discharge status: Home / Self Care | Payer: BC Managed Care – PPO | Source: Ambulatory Visit | Attending: Cardiology

## 2023-10-19 ENCOUNTER — Encounter: Payer: Self-pay | Admitting: Internal Medicine

## 2023-10-19 DIAGNOSIS — N183 Chronic kidney disease, stage 3 unspecified: Secondary | ICD-10-CM | POA: Insufficient documentation

## 2023-10-19 DIAGNOSIS — I5023 Acute on chronic systolic (congestive) heart failure: Secondary | ICD-10-CM | POA: Diagnosis present

## 2023-10-19 LAB — BASIC METABOLIC PANEL
Anion gap: 14 (ref 5–15)
BUN: 30 mg/dL — ABNORMAL HIGH (ref 8–23)
CO2: 30 mmol/L (ref 22–32)
Calcium: 10 mg/dL (ref 8.9–10.3)
Chloride: 95 mmol/L — ABNORMAL LOW (ref 98–111)
Creatinine, Ser: 1.38 mg/dL — ABNORMAL HIGH (ref 0.44–1.00)
GFR, Estimated: 43 mL/min — ABNORMAL LOW (ref >60)
Glucose, Bld: 156 mg/dL — ABNORMAL HIGH (ref 70–99)
Potassium: 3.3 mmol/L — ABNORMAL LOW (ref 3.5–5.1)
Sodium: 139 mmol/L (ref 135–145)

## 2023-10-19 LAB — DIGOXIN LEVEL: Digoxin Level: <0.2 ng/mL — ABNORMAL LOW (ref 0.8–2.0)

## 2023-10-22 ENCOUNTER — Telehealth: Payer: Self-pay | Admitting: Family Medicine

## 2023-10-22 ENCOUNTER — Ambulatory Visit: Payer: BC Managed Care – PPO | Attending: Cardiology

## 2023-10-22 ENCOUNTER — Telehealth (HOSPITAL_COMMUNITY): Payer: Self-pay

## 2023-10-22 DIAGNOSIS — Z95810 Presence of automatic (implantable) cardiac defibrillator: Secondary | ICD-10-CM

## 2023-10-22 DIAGNOSIS — I5022 Chronic systolic (congestive) heart failure: Secondary | ICD-10-CM | POA: Diagnosis not present

## 2023-10-22 NOTE — Telephone Encounter (Signed)
-----   Message from Jacklynn Ganong sent at 10/22/2023  8:10 AM EST ----- K is a bit low. Please take extra 40 KCL today and tomorrow. Make sure to take extra 40 KCL when taking metolazone.   Repeat BMET in 7-10 days

## 2023-10-22 NOTE — Telephone Encounter (Signed)
Patient's labs has been placed and her appointment scheduled. In addition her med list has been updatedPt aware, agreeable, and verbalized understanding.

## 2023-10-23 NOTE — Progress Notes (Signed)
EPIC Encounter for ICM Monitoring  Patient Name: Brittany [name] is a 62 y.o. female Date: 10/23/2023 Primary Care Physican: Georganna Skeans, MD Primary Cardiologist: Bensimhon Electrophysiologist: Lalla Brothers 02/13/2023 Weight: 100-102 lbs 03/20/2023 Weight: 100 lbs 05/22/2023 Weight: 96 lbs 06/26/2023 Weight: 96-98 lbs 08/22/2023 Weight: 93 lbs 10/23/2023 Weight: 93-94 lbs     Spoke with patient and heart failure questions reviewed.  Transmission results reviewed.  Pt asymptomatic for fluid accumulation and feeling well at this time.     Optivol Thoracic impedance suggesting slight downward trend below baseline.     Prescribed: Torsemide 20 mg take 4 tablets (80 mg total) by mouth twice a day.  10/16/23 She reports taking differently ... She is taking 80 mg every AM and 40 mg every evening.   Potassium 10 mEq take 4 tablets (40 mEq total) by mouth twice a day.  Take extra 4 tablets (40 mEq total) with weekly Metolazone.   Spironolactone 25 mg take 1 tablet (25 mg total) by mouth at bedtime Metolazone 2.5 mg take 1 tablet by mouth every week on Wednesday.  10/16/23 She reports taking differently ... She is taking PRN for symptoms.      Labs: 10/19/2023 Creatinine 1.38, BUN 30, Potassium 3.3, Sodium 139, GFR 43 (extra potassium ordered by HF clinic) 10/11/2023 Creatinine 1.42, BUN 26, Potassium 3.5, Sodium 141  09/29/2023 Creatinine 1.74, BUN 78, Potassium 5.0, Sodium 131, GFR 33  09/28/2023 Creatinine 1.75, BUN 64, Potassium 3.8, Sodium 134, GFR 32  09/27/2023 Creatinine 1.68, BUN 48, Potassium 4.3, Sodium 136, GFR 34 09/26/2023 Creatinine 1.81, BUN 38, Potassium 4.2, Sodium 134, GFR 31  09/25/2023 Potassium 2.7, Sodium 138 (5:27 AM) 09/25/2023 Creatinine 1.60, BUN 24, Potassium 3.3, Sodium 140  09/11/2023 Creatinine 1.69, BUN 37, Potassium 3.5, Sodium 138, GFR 34 08/16/2023 Creatinine 1.43, BUN 29, Potassium 3.3, Sodium 139, GFR 41 (potassium addressed per lab note) A complete set of  results can be found in Results Review.   Recommendations:  No changes and encouraged to call if experiencing any fluid symptoms.   Follow-up plan: ICM clinic phone appointment on 11/05/2023 to recheck fluid levels.   91 day device clinic remote transmission 12/19/2023.     EP/Cardiology Office Visits:  10/30/2023 with HF clinic.  Missed 10/10/2023 with Dr Lalla Brothers and not rescheduled.   Copy of ICM check sent to Dr. Lalla Brothers.  3 month ICM trend: 10/22/2023.    12-14 Month ICM trend:     Karie Soda, RN 10/23/2023 3:39 PM

## 2023-10-29 NOTE — Progress Notes (Incomplete)
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 Underwent emergent cath 02/03/18 showed LAD 100% stenosed, LCx 95% stenosed. Taken for emergent CABG 02/03/18. Required impella post op. Hospital course complicated by cardiogenic shock, HCAP, A fib, respiratory failure, and swallowing issues. She was discharged to SNF. Discharge weight 103 pounds.    In 2019 had multiple hospitalizations for HF and pleural effusion. Underwent [facility]desis at [facility]   Admitted 3/20 with NSTEMI and HF. Received DES to ostial ramus into distal left main based on cath below and underwent diuresis. Meds adjusted as tolerated. Echo with EF 25-30%.   Admitted 11/21 with COPD flare with mild HF and hemoptysis. CTA showed stable scarring in the right hilum consistent with emphysema.. Underwent bronchoscopy on 11/15, no obvious source of bleeding found on examination.   Echo 11/21 EF 20-25%. RV mildly HK.   Admitted 3/22 Echo with EF < 20%. Treated for AECOPD exacerbation.   Admitted 12/22 with R renal infarct felt to be cardio-embolic (no AF found). Started on Eliquis  Admitted 8/23 w/ a/c respiratory failure and CHF. Concern for low output vs PNA vs COPD exacerbation. PICC placed and milrinone started. Diuresed w/ IV Lasix. Given prednisone taper and doxy for COPD exacerbation. Echo showed EF 20-25%, global hypokinesis, GIIDD, RV systolic function, mildly reduced. RV size normal. Mild MR, trivial TR, aortic valve w/ mild-mod regurg.  Barostim placed.   Admitted 12/23 with COPDE, a/c CHF and  influenza +. Placed on BiPap, treated with tamiflu and diuresed with IV lasix. Empirically started milrinone over concern for low output  Admitted 5/24 with N/V. CT ok, wet prep + trich, treated with IV Flagyl.  HsTroponin elevated. Cards consulted felt elevated troponin 2/2 demand ischemia. Echo showed EF 20-25%, LV with global HK, RV mildly reduced, moderate AI. [name] and KCL stopped with AKI and she was discharged home, weight 101 lbs.  Admitted 8/24 with a/c CHF and AECOPD. Treated with steroids and duonebs and diuresed with IV lasix + metolazone. She was seen by Advanced Heart Failure. Discharged home on 80 mg Torsemide BID.  Admitted 10/24 with COPD + HF. This is the 3rd admit in 6 months. Diuresed with IV lasix 7 metolazone, required BiPap and steroids. Echo showed EF 20-25%, G2DD, RV mildly reduced. Able to wean to 3L [place]. She was discharged home, weight 91 lbs. She remained DNR during hospitalization.  Today she returns for post hospital HF follow up. Overall feeling fair, breathing "not so good". She is SOB walking short distances on flat ground. Felt some palps yesterday. Balance is off and she feels a bit dizzy. Denies CP, edema, or PND/Orthopnea. Appetite ok. No fever or chills. Weight at home 94-95 pounds. Has not had metolazone since discharge. She reduced her torsemide to 60 mg q am, then occasionally takes 20 in the evenings. Frustrated with increased urination and not able to leave the house when she takes higher doses of diuretics.  Cardidac Studies: - Echo (10/24): EF 20-25%, G2DD, RV mildly reduced.  - Echo (5/24): EF 20-25%, LV with global HK, RV mildly  reduced, moderate AI.  - Echo (7/23): EF 20-25%, global hypokinesis, GIIDD, RV systolic function, mildly reduced. RV size normal. Mild MR, trivial TR, aortic valve w/ mild-mod regurg  - Echo 1/23: EF 20-25%, severely reduced LV with global HK, grade II DD, normal RV  - Echo 02/10/21: EF <20%, G2DD  - Echo 2022: EF < 20% RV  normal   - Echo 2021: EF 20-25% RV mildly reduced.   - Echo 02/12/19: EF 25-30%, RV mildly reduced  - 05/18/2021 PFT's: moderately severe airway obstruction and a diffusion defect suggesting emphysema.   - LHC 02/12/19: Ost LAD to Prox LAD lesion is 100% stenosed. Ost Cx to Prox Cx lesion is 100% stenosed. Ost Ramus lesion is 99% stenosed. Post intervention, there is a 0% residual stenosis. A drug-eluting stent was successfully placed using a STENT RESOLUTE ONYX 2.25X15. SVG and is normal in caliber. The graft exhibits no disease. Ost 1st Diag lesion is 70% stenosed. SVG and is normal in caliber. Prox Graft lesion is 100% stenosed. 1.  Significant underlying two-vessel coronary artery disease with patent SVG to OM and SVG to diagonal which supplies the LAD territory.  Atretic LIMA to LAD given competitive flow from SVG to diagonal.  Severe ostial ramus artery stenosis not supplied by bypass with his area suggestive of plaque rupture. 2.  Severely elevated left ventricular end-diastolic pressure 34 mmHg.  Left ventricular angiography was not performed. 3.  Successful angioplasty and drug-eluting stent placement to the ostial ramus extending into the distal left main.  - RHC 2019  RA = 8 RV = 49/9 PA = 47/20 (32) PCW = 19 Fick cardiac output/index = 4.3/3.1 PVR = 3.0 wu AO sat = 93% PA sat = 62%, 64%   1. Minimally elevated left heart pressures 2. Mild PAH 3. Normal cardiac output  Past Medical History:  Diagnosis Date   Acute respiratory failure (HCC) 05/05/2018   Acute systolic congestive heart failure (HCC) 02/03/2018   AICD (automatic cardioverter/defibrillator) present    Allergy    Anxiety    Asthma    DM2 (diabetes mellitus, type 2) (HCC) 10/20/2020   Hypertension    PAF (paroxysmal atrial fibrillation) (HCC)    Presence of permanent cardiac pacemaker    Prophylactic measure 08/03/14-08/19/14   Prophyl. 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   LIMA  to LAD, SVG to D1, SVG to OM1, EVH via right thigh with implantation of Impella LD LVAD via direct aortic approach   Small cell lung cancer (HCC) 03/16/2014   Past Surgical History:  Procedure Laterality Date   BRONCHIAL BRUSHINGS  10/25/2020   Procedure: BRONCHIAL BRUSHINGS;  Surgeon: Leslye Peer, MD;  Location: [facility] ENDOSCOPY;  Service: Pulmonary;;   BRONCHIAL NEEDLE ASPIRATION BIOPSY  10/25/2020   Procedure: BRONCHIAL NEEDLE ASPIRATION BIOPSIES;  Surgeon: Leslye Peer, MD;  Location: [facility] ENDOSCOPY;  Service: Pulmonary;;   CARDIAC DEFIBRILLATOR PLACEMENT  08/15/2018   MDT Visia AF MRI VR ICD implanted by Dr Georgena Spurling for primary prevention of sudden   CESAREAN SECTION     CORONARY ARTERY BYPASS GRAFT N/A 02/03/2018   Procedure: CORONARY ARTERY BYPASS GRAFTING (CABG);  Surgeon: Purcell Nails, MD;  Location: [facility] OR;  Service: Open Heart Surgery;  Laterality: N/A;  Time 3 using left internal mammary artery and endoscopically harvested right saphenous vein   CORONARY BALLOON ANGIOPLASTY N/A 02/03/2018   Procedure: CORONARY BALLOON ANGIOPLASTY;  Surgeon: Swaziland, Peter M, MD;  Location: [facility] INVASIVE  CV LAB;  Service: Cardiovascular;  Laterality: N/A;   CORONARY STENT INTERVENTION N/A 02/12/2019   Procedure: CORONARY STENT INTERVENTION;  Surgeon: Iran Ouch, MD;  Location: [facility] INVASIVE CV LAB;  Service: Cardiovascular;  Laterality: N/A;   CORONARY/GRAFT ACUTE MI REVASCULARIZATION N/A 02/03/2018   Procedure: Coronary/Graft Acute MI Revascularization;  Surgeon: Swaziland, Peter M, MD;  Location: [facility] INVASIVE CV LAB;  Service: Cardiovascular;  Laterality: N/A;   ENDOBRONCHIAL ULTRASOUND N/A 10/25/2020   Procedure: ENDOBRONCHIAL ULTRASOUND;  Surgeon: Leslye Peer, MD;  Location: [facility] ENDOSCOPY;  Service: Pulmonary;  Laterality: N/A;   FLEXIBLE BRONCHOSCOPY  10/25/2020   Procedure: FLEXIBLE BRONCHOSCOPY;  Surgeon: Leslye Peer, MD;  Location: [facility] ENDOSCOPY;  Service: Pulmonary;;   IABP INSERTION N/A  02/03/2018   Procedure: IABP Insertion;  Surgeon: Swaziland, Peter M, MD;  Location: [facility] INVASIVE CV LAB;  Service: Cardiovascular;  Laterality: N/A;   INTRAOPERATIVE TRANSESOPHAGEAL ECHOCARDIOGRAM N/A 02/03/2018   Procedure: INTRAOPERATIVE TRANSESOPHAGEAL ECHOCARDIOGRAM;  Surgeon: Purcell Nails, MD;  Location: [facility] OR;  Service: Open Heart Surgery;  Laterality: N/A;   LEFT HEART CATH AND CORONARY ANGIOGRAPHY N/A 02/03/2018   Procedure: LEFT HEART CATH AND CORONARY ANGIOGRAPHY;  Surgeon: Swaziland, Peter M, MD;  Location: [facility] INVASIVE CV LAB;  Service: Cardiovascular;  Laterality: N/A;   LEFT HEART CATH AND CORS/GRAFTS ANGIOGRAPHY N/A 02/12/2019   Procedure: LEFT HEART CATH AND CORS/GRAFTS ANGIOGRAPHY;  Surgeon: Iran Ouch, MD;  Location: [facility] INVASIVE CV LAB;  Service: Cardiovascular;  Laterality: N/A;   MEDIASTINOSCOPY N/A 03/11/2014   Procedure: MEDIASTINOSCOPY;  Surgeon: Loreli Slot, MD;  Location: [facility] OR;  Service: Thoracic;  Laterality: N/A;   PLACEMENT OF IMPELLA LEFT VENTRICULAR ASSIST DEVICE  02/03/2018   Procedure: PLACEMENT OF IMPELLA LEFT VENTRICULAR ASSIST DEVICE LD;  Surgeon: Purcell Nails, MD;  Location: [facility] OR;  Service: Open Heart Surgery;;   REMOVAL OF IMPELLA LEFT VENTRICULAR ASSIST DEVICE N/A 02/08/2018   Procedure: REMOVAL OF IMPELLA LEFT VENTRICULAR ASSIST DEVICE;  Surgeon: Purcell Nails, MD;  Location: [facility] OR;  Service: Open Heart Surgery;  Laterality: N/A;   RIGHT HEART CATH N/A 02/03/2018   Procedure: RIGHT HEART CATH;  Surgeon: Swaziland, Peter M, MD;  Location: [facility] INVASIVE CV LAB;  Service: Cardiovascular;  Laterality: N/A;   RIGHT HEART CATH N/A 05/09/2018   Procedure: RIGHT HEART CATH;  Surgeon: Dolores Patty, MD;  Location: [facility] INVASIVE CV LAB;  Service: Cardiovascular;  Laterality: N/A;   TEE WITHOUT CARDIOVERSION N/A 02/08/2018   Procedure: TRANSESOPHAGEAL ECHOCARDIOGRAM (TEE);  Surgeon: Purcell Nails, MD;  Location: [facility] OR;  Service: Open Heart Surgery;   Laterality: N/A;   TUBAL LIGATION     VIDEO BRONCHOSCOPY WITH ENDOBRONCHIAL ULTRASOUND N/A 03/11/2014   Procedure: VIDEO BRONCHOSCOPY WITH ENDOBRONCHIAL ULTRASOUND;  Surgeon: Loreli Slot, MD;  Location: [facility] OR;  Service: Thoracic;  Laterality: N/A;   Current Outpatient Medications  Medication Sig Dispense Refill   acetaminophen (TYLENOL) 325 MG tablet Take 2 tablets (650 mg total) by mouth every 6 (six) hours as needed for mild pain (pain score 1-3) (or Fever >/= 101).     albuterol (VENTOLIN HFA) 108 (90 Base) MCG/ACT inhaler Inhale 2 puffs into the lungs every 6 (six) hours as needed for wheezing or shortness of breath. 8.5 g 1   apixaban (ELIQUIS) 2.5 MG TABS tablet Take 1 tablet (2.5 mg total) by mouth 2 (two) times daily. 180 tablet 1   atorvastatin (LIPITOR) 80 MG tablet Take 1 tablet (80  mg total) by mouth daily. 90 tablet 0   cyclobenzaprine (FLEXERIL) 10 MG tablet Take 1 tablet (10 mg total) by mouth 3 (three) times daily as needed for muscle spasms. 60 tablet 3   dapagliflozin propanediol (FARXIGA) 10 MG TABS tablet Take 1 tablet by mouth daily. 30 tablet 11   digoxin (LANOXIN) 0.125 MG tablet TAKE 1 TABLET BY MOUTH DAILY 90 tablet 3   Fluticasone-Umeclidin-Vilant (TRELEGY ELLIPTA) 200-62.5-25 MCG/ACT AEPB Inhale 1 puff into the lungs daily. 60 each 0   guaiFENesin-dextromethorphan (ROBITUSSIN DM) 100-10 MG/5ML syrup Take 5 mLs by mouth every 6 (six) hours as needed for cough. 118 mL 0   hydrOXYzine (VISTARIL) 25 MG capsule TAKE 1 CAPSULE (25 MG TOTAL) BY MOUTH DAILY AS NEEDED. 90 capsule 1   ipratropium-albuterol (DUONEB) 0.5-2.5 (3) MG/3ML SOLN Take 3 mLs by nebulization every 4 (four) hours as needed (for shortness of breath). 90 mL 3   ivabradine (CORLANOR) 7.5 MG TABS tablet TAKE 1 TABLET BY MOUTH TWICE A DAY WITH A MEAL 60 tablet 0   metolazone (ZAROXOLYN) 2.5 MG tablet Take 1 tablet (2.5 mg total) by mouth once a week. Take every Tuesday with extra 40 mEq of Potassium      nitroGLYCERIN (NITROSTAT) 0.4 MG SL tablet Place 1 tablet (0.4 mg total) under the tongue every 5 (five) minutes as needed for chest pain. 90 tablet 5   Nutritional Supplements (ENSURE ORIGINAL) LIQD Take 2-3 Bottles by mouth daily.     potassium chloride (KLOR-CON) 10 MEQ tablet Take 4 tablets (40 mEq total) by mouth 2 (two) times daily. Take extra 4 tablets (40 mEq total) with weekly Metolazone dose (Patient taking differently: Take 40 mEq by mouth 2 (two) times daily. Take extra 8 tablets (40 mEq total) with Metolazone dose)     spironolactone (ALDACTONE) 25 MG tablet Take 0.5 tablets (12.5 mg total) by mouth at bedtime.     torsemide (DEMADEX) 20 MG tablet Take 4 tablets (80 mg total) by mouth 2 (two) times daily. 240 tablet 6   No current facility-administered medications for this visit.    Allergies:   Lactose intolerance (gi) and Codeine   Social History:  The patient  reports that she quit smoking about 9 years ago. Her smoking use included cigarettes. She started smoking about 29 years ago. She has a 20 pack-year smoking history. She has never used smokeless tobacco. She reports current alcohol use of about 5.0 standard drinks of alcohol per week. She reports current drug use. Drug: Marijuana.   Family History:  The patient's family history includes Cancer in her maternal grandmother; Diabetes in her paternal grandmother; Heart attack in her mother; Heart disease in her mother; Hypertension in her maternal grandmother and mother.   ROS:  Please see the history of present illness.   All other systems are personally reviewed and negative.    Recent Labs: 08/03/2023: TSH 0.787 09/25/2023: ALT 21 09/29/2023: Magnesium 2.4 10/09/2023: B Natriuretic Peptide 2,221.1; Hemoglobin 14.0; Platelets 235 10/19/2023: BUN 30; Creatinine, Ser 1.38; Potassium 3.3; Sodium 139  Personally reviewed   Wt Readings from Last 3 Encounters:  10/11/23 43.1 kg (95 lb)  10/09/23 45.2 kg (99 lb 9.6 oz)   09/29/23 41.5 kg (91 lb 7.9 oz)   There were no vitals taken for this visit.  Physical Exam General:  NAD. No resp difficulty, walked into clinic, thin HEENT: Normal Neck: Supple. JVP 10. Carotids 2+ bilat; no bruits. No lymphadenopathy or thryomegaly appreciated. Cor: PMI nondisplaced.  Regular rate & rhythm. No rubs, gallops or murmurs. Lungs: + rhonchi throughout Abdomen: Soft, nontender, nondistended. No hepatosplenomegaly. No bruits or masses. Good bowel sounds. Extremities: No cyanosis, clubbing, rash, edema Neuro: Alert & oriented x 3, cranial nerves grossly intact. Moves all 4 extremities w/o difficulty. Affect pleasant.  ECG (personally reviewed): patient declined ECG today  Device interrogation (personally reviewed): OptiVol up, thoracic impedence down, no AF, 1.2 hr/day activity  Assessment & Plan: 1. Acute on Chronic Systolic Heart Failure - EF has been 20-25% for the last 5 years.  - S/p Medtronic ICD and Barostim - Echo 5/24: EF 20-25%, RV mildly reduced - Echo 10/24: EF 20-25%, RV mildly reduced - NYHA IIb-III, volume up today on exam and OptiVol, weight up 8 lbs.  - Discussed importance of taking diuretics as directed by AHF clinic and not self-titrating down. - Increase torsemide back to 80 mg bid, continue 40 KCL bid - Resume weekly metolazone 2.5 mg + extra 40 KCL on Tuesdays - Continue Farxiga 10 mg daily. - Continue digoxin 0.125 mg daily. - Continue ivabradine 7.5 mg bid. - Continue spironolactone 12.5 mg daily.  - Not a candidate for advanced therapies due to severity of lung disease.  - Labs today, repeat BMET in 7-10 days. - She is enrolled in monthly iCM  2. Chronic Hypoxic Respiratory Failure/ AECOPD  - Hx frequent admissions for AE COPD + A/C HFrEF.  - No longer on oxygen. - Recently finished steroids. - Rhonchi on exam today. - Needs Pulm follow up - Continue Trelegy   3. CKD Stage IIIb  - Creatinine baseline ~ 1.4-1.6 - Continue SGLT2i -  Labs today.   4. PAF  - Regular on exam today, no AF on device interrogation. - Continue Eliquis 2.5 mg bid. Reduced dose (weight/creatinine) - CBC today.   5. CAD - History of CABG x 2 2019 - s/p DES ostial ramus extending to left main (2020) - No chest pain.  - Continue atorvastatin - No ASA with  need for AC   6. H/O SCLC - Completed treatment 2015.  - CT chest (5/24) soft tissue thickening along mediastinum but no discrete mass    7. H/O Renal Infarct 2022 - On Eliquis  Follow up in 2-3 weeks with APP. Ideally will follow closely x 2-3 visits until volume more stable. She is agreeable to this.  [name], FNP-BC 10/29/23

## 2023-10-30 ENCOUNTER — Ambulatory Visit (HOSPITAL_COMMUNITY)
Admission: RE | Admit: 2023-10-30 | Discharge: 2023-10-30 | Disposition: A | Discharge status: Home / Self Care | Payer: BC Managed Care – PPO | Source: Ambulatory Visit | Attending: Family Medicine | Admitting: Family Medicine

## 2023-10-30 ENCOUNTER — Encounter (HOSPITAL_COMMUNITY): Payer: Self-pay

## 2023-10-30 VITALS — BP 94/60 | HR 93 | Wt 95.0 lb

## 2023-10-30 DIAGNOSIS — Z7901 Long term (current) use of anticoagulants: Secondary | ICD-10-CM | POA: Diagnosis not present

## 2023-10-30 DIAGNOSIS — I5022 Chronic systolic (congestive) heart failure: Secondary | ICD-10-CM | POA: Diagnosis present

## 2023-10-30 DIAGNOSIS — I5023 Acute on chronic systolic (congestive) heart failure: Secondary | ICD-10-CM | POA: Diagnosis not present

## 2023-10-30 DIAGNOSIS — N183 Chronic kidney disease, stage 3 unspecified: Secondary | ICD-10-CM | POA: Diagnosis not present

## 2023-10-30 DIAGNOSIS — I130 Hypertensive heart and chronic kidney disease with heart failure and stage 1 through stage 4 chronic kidney disease, or unspecified chronic kidney disease: Secondary | ICD-10-CM | POA: Diagnosis not present

## 2023-10-30 DIAGNOSIS — Z85118 Personal history of other malignant neoplasm of bronchus and lung: Secondary | ICD-10-CM | POA: Insufficient documentation

## 2023-10-30 DIAGNOSIS — I2510 Atherosclerotic heart disease of native coronary artery without angina pectoris: Secondary | ICD-10-CM | POA: Insufficient documentation

## 2023-10-30 DIAGNOSIS — J9611 Chronic respiratory failure with hypoxia: Secondary | ICD-10-CM | POA: Diagnosis not present

## 2023-10-30 DIAGNOSIS — Z951 Presence of aortocoronary bypass graft: Secondary | ICD-10-CM | POA: Insufficient documentation

## 2023-10-30 DIAGNOSIS — I480 Paroxysmal atrial fibrillation: Secondary | ICD-10-CM | POA: Diagnosis present

## 2023-10-30 DIAGNOSIS — N280 Ischemia and infarction of kidney: Secondary | ICD-10-CM

## 2023-10-30 DIAGNOSIS — C3490 Malignant neoplasm of unspecified part of unspecified bronchus or lung: Secondary | ICD-10-CM

## 2023-10-30 DIAGNOSIS — J441 Chronic obstructive pulmonary disease with (acute) exacerbation: Secondary | ICD-10-CM | POA: Diagnosis not present

## 2023-10-30 DIAGNOSIS — N1832 Chronic kidney disease, stage 3b: Secondary | ICD-10-CM | POA: Diagnosis not present

## 2023-10-30 LAB — BASIC METABOLIC PANEL
Anion gap: 10 (ref 5–15)
BUN: 20 mg/dL (ref 8–23)
CO2: 27 mmol/L (ref 22–32)
Calcium: 9.3 mg/dL (ref 8.9–10.3)
Chloride: 101 mmol/L (ref 98–111)
Creatinine, Ser: 1.5 mg/dL — ABNORMAL HIGH (ref 0.44–1.00)
GFR, Estimated: 39 mL/min — ABNORMAL LOW (ref >60)
Glucose, Bld: 175 mg/dL — ABNORMAL HIGH (ref 70–99)
Potassium: 3.4 mmol/L — ABNORMAL LOW (ref 3.5–5.1)
Sodium: 138 mmol/L (ref 135–145)

## 2023-10-30 LAB — FERRITIN: Ferritin: 160 ng/mL (ref 11–307)

## 2023-10-30 LAB — IRON AND TIBC
Iron: 72 ug/dL (ref 28–170)
Saturation Ratios: 21 % (ref 10.4–31.8)
TIBC: 344 ug/dL (ref 250–450)
UIBC: 272 ug/dL

## 2023-10-30 LAB — BRAIN NATRIURETIC PEPTIDE: B Natriuretic Peptide: 1854.8 pg/mL — ABNORMAL HIGH (ref 0.0–100.0)

## 2023-10-30 LAB — DIGOXIN LEVEL: Digoxin Level: 0.2 ng/mL — ABNORMAL LOW (ref 0.8–2.0)

## 2023-10-30 NOTE — Patient Instructions (Signed)
No change in medications. Remember to take your Metolazone every Tuesday with an extra 40 meq of potassium. Labs today - will call you if abnormal. We canceled your lab appointment for tomorrow. Return to Heart Failure APP Clinic in 2 - 3 weeks. Please call us at [phone] if any questions or concerns prior to your next visit.

## 2023-10-30 NOTE — Addendum Note (Signed)
Encounter addended by: Jacklynn Ganong, FNP on: 10/30/2023 10:44 AM  Actions taken: Clinical Note Signed

## 2023-10-31 ENCOUNTER — Other Ambulatory Visit (HOSPITAL_COMMUNITY): Payer: BC Managed Care – PPO

## 2023-11-02 ENCOUNTER — Telehealth: Payer: BC Managed Care – PPO | Admitting: Nurse Practitioner

## 2023-11-02 ENCOUNTER — Ambulatory Visit: Payer: Self-pay

## 2023-11-02 DIAGNOSIS — J22 Unspecified acute lower respiratory infection: Secondary | ICD-10-CM

## 2023-11-02 DIAGNOSIS — J441 Chronic obstructive pulmonary disease with (acute) exacerbation: Secondary | ICD-10-CM | POA: Diagnosis not present

## 2023-11-02 MED ORDER — DOXYCYCLINE HYCLATE 100 MG PO TABS
100.0000 mg | ORAL_TABLET | Freq: Two times a day (BID) | ORAL | 0 refills | Status: AC
Start: 2023-11-02 — End: 2023-11-09

## 2023-11-02 MED ORDER — PREDNISONE 20 MG PO TABS
20.0000 mg | ORAL_TABLET | Freq: Two times a day (BID) | ORAL | 0 refills | Status: AC
Start: 2023-11-02 — End: 2023-11-07

## 2023-11-02 NOTE — Telephone Encounter (Signed)
Chief Complaint: Productive Cough Symptoms: Cough, headache, fatigue, mild SOB Frequency: constant  Pertinent Negatives: Patient denies chest pain, nausea, vomiting  Disposition: [] ED /[] Urgent Care (no appt availability in office) / [] Appointment(In office/virtual)/ [x]  [facility]/ [] Home Care/ [] Refused Recommended Disposition /[] [facility]/ []  Follow-up with PCP Additional Notes: Patient stated she has had a cough for about 1-2 weeks that is not getting better. Patient has a hx of CHF and went to the heart failure clinic for mild SOB thinking it was related to the CHF. The provider told the patient to contact PCP office because the SOB was not related to CHF. Patient reports that she was told she needs a Z-pack and prednisone for her symptoms. Care advice was given and no appointments available this week in the office. Patient has been scheduled for a virtual Urgent care visit today at 1630.   Reason for Disposition  [1] Continuous (nonstop) coughing interferes with work or school AND [2] no improvement using cough treatment per Care Advice  Answer Assessment - Initial Assessment Questions 1. ONSET: "When did the cough begin?"      1-2 weeks 2. SEVERITY: "How bad is the cough today?"      Severe 3. SPUTUM: "Describe the color of your sputum" (none, dry cough; clear, white, yellow, green)     yellow 4. HEMOPTYSIS: "Are you coughing up any blood?" If so ask: "How much?" (flecks, streaks, tablespoons, etc.)     1 time last week  5. DIFFICULTY BREATHING: "Are you having difficulty breathing?" If Yes, ask: "How bad is it?" (e.g., mild, moderate, severe)    - MILD: No SOB at rest, mild SOB with walking, speaks normally in sentences, can lie down, no retractions, pulse < 100.    - MODERATE: SOB at rest, SOB with minimal exertion and prefers to sit, cannot lie down flat, speaks in phrases, mild retractions, audible wheezing, pulse 100-120.    - SEVERE: Very SOB at rest,  speaks in single words, struggling to breathe, sitting hunched forward, retractions, pulse > 120      Mild 6. FEVER: "Do you have a fever?" If Yes, ask: "What is your temperature, how was it measured, and when did it start?"     I think I had a fever today but I did not check it  7. CARDIAC HISTORY: "Do you have any history of heart disease?" (e.g., heart attack, congestive heart failure)      Congestive heart failure  8. LUNG HISTORY: "Do you have any history of lung disease?"  (e.g., pulmonary embolus, asthma, emphysema)     COPD  9. PE RISK FACTORS: "Do you have a history of blood clots?" (or: recent major surgery, recent prolonged travel, bedridden)     No  10. OTHER SYMPTOMS: "Do you have any other symptoms?" (e.g., runny nose, wheezing, chest pain)       Sore throat, headache, loss of appetite, fatigue  Protocols used: Cough - Acute Productive-A-AH

## 2023-11-02 NOTE — Progress Notes (Signed)
Virtual Visit Consent   Brittany [name], you are scheduled for a virtual visit with a [facility] provider today. Just as with appointments in the office, your consent must be obtained to participate. Your consent will be active for this visit and any virtual visit you may have with one of our providers in the next 365 days. If you have a MyChart account, a copy of this consent can be sent to you electronically.  As this is a virtual visit, video technology does not allow for your provider to perform a traditional examination. This may limit your provider's ability to fully assess your condition. If your provider identifies any concerns that need to be evaluated in person or the need to arrange testing (such as labs, EKG, etc.), we will make arrangements to do so. Although advances in technology are sophisticated, we cannot ensure that it will always work on either your end or our end. If the connection with a video visit is poor, the visit may have to be switched to a telephone visit. With either a video or telephone visit, we are not always able to ensure that we have a secure connection.  By engaging in this virtual visit, you consent to the provision of healthcare and authorize for your insurance to be billed (if applicable) for the services provided during this visit. Depending on your insurance coverage, you may receive a charge related to this service.  I need to obtain your verbal consent now. Are you willing to proceed with your visit today? Brittany [name] has provided verbal consent on 11/02/2023 for a virtual visit (video or telephone). Viviano Simas, FNP  Date: 11/02/2023 4:27 PM  Virtual Visit via Video Note   I, Viviano Simas, connected with  Brittany [name]  ([id number], 04/18/1961) on 11/02/23 at  4:30 PM EST by a video-enabled telemedicine application and verified that I am speaking with the correct person using two identifiers.  Location: Patient: Virtual  Visit Location Patient: Home Provider: Virtual Visit Location Provider: Home Office   I discussed the limitations of evaluation and management by telemedicine and the availability of in person appointments. The patient expressed understanding and agreed to proceed.    History of Present Illness: Brittany [name] is a 62 y.o. who identifies as a female who was assigned female at birth, and is being seen today for cough/congestion and sore throat  Symptom onset was last week. She was seen by her cardiologist and at that time they requested she contact her PCP for management of URI symptoms   Today she has loss of appetite  Fatigue Inspiratory wheezing   History of pneumonia in the past   History of COPD currently uses Triligy daily and has been using her Albuterol nebulizer for additional relief  Denies a fever today   Problems:  Patient Active Problem List   Diagnosis Date Noted   Chronic respiratory failure with hypoxia and hypercapnia (HCC) 10/11/2023   Acute on chronic systolic CHF (congestive heart failure) (HCC) 09/25/2023   AKI (acute kidney injury) (HCC) 08/04/2023   Acute on chronic respiratory failure (HCC) 08/03/2023   Trichomonas infection 04/19/2023   Presence of heart assist device (HCC) 04/11/2023   COPD with acute exacerbation (HCC) 03/01/2023   CAD (coronary artery disease) 03/01/2023   Acute respiratory failure with hypoxia (HCC) 11/14/2022   Sepsis (HCC) 11/14/2022   Influenza A with pneumonia 11/14/2022   Troponin level elevated 11/14/2022   History of lung cancer 11/14/2022   Acute combined systolic and diastolic  CHF, NYHA class 4 (HCC) 07/09/2022   Malnutrition of moderate degree 06/20/2022   Lactic acidosis 04/12/2022   Prolonged QT interval 04/09/2022   Hypotension 04/09/2022   Chronic HFrEF (heart failure with reduced ejection fraction) (HCC) 12/11/2021   CHF exacerbation (HCC) 12/10/2021   Renal infarct (HCC) 12/10/2021   COPD exacerbation  (HCC) 09/29/2021   Mixed diabetic hyperlipidemia associated with type 2 diabetes mellitus (HCC) 09/29/2021   CKD stage 3b, GFR 30-44 ml/min (HCC) 09/29/2021   Unstable angina (HCC) 02/09/2021   Hemoptysis 10/20/2020   Type 2 diabetes mellitus with hyperlipidemia (HCC) 10/20/2020   Acute on chronic combined systolic and diastolic CHF (congestive heart failure) (HCC) 05/21/2020   Ischemic cardiomyopathy 04/12/2020   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   NSTEMI (non-ST elevated myocardial infarction) (HCC) 02/12/2019   CHF (congestive heart failure) (HCC) 11/24/2018   Post-operative pain 09/10/2018   COPD (chronic obstructive pulmonary disease) (HCC) 09/04/2018   Recurrent pleural effusion on right 05/05/2018   Acute on chronic respiratory failure with hypoxia (HCC) 05/05/2018   Chronic respiratory failure with hypoxia (HCC)    Asthma    Anxiety    Allergy    HCAP (healthcare-associated pneumonia) 03/15/2015   Chest pain 03/15/2015   Small cell lung cancer (HCC) 03/16/2014   Protein-calorie malnutrition, severe (HCC) 03/14/2014   PAF (paroxysmal atrial fibrillation) (HCC) 03/12/2014   Essential hypertension 05/07/2012    Allergies:  Allergies  Allergen Reactions   Lactose Intolerance (Gi)     Per patient   Codeine Nausea And Vomiting   Medications:  Current Outpatient Medications:    albuterol (VENTOLIN HFA) 108 (90 Base) MCG/ACT inhaler, Inhale 2 puffs into the lungs every 6 (six) hours as needed for wheezing or shortness of breath., Disp: 8.5 g, Rfl: 1   apixaban (ELIQUIS) 2.5 MG TABS tablet, Take 1 tablet (2.5 mg total) by mouth 2 (two) times daily., Disp: 180 tablet, Rfl: 1   atorvastatin (LIPITOR) 80 MG tablet, Take 1 tablet (80 mg total) by mouth daily., Disp: 90 tablet, Rfl: 0   cyclobenzaprine (FLEXERIL) 10 MG tablet, Take 1 tablet (10 mg total) by mouth 3 (three) times daily as needed for muscle spasms., Disp: 60 tablet, Rfl: 3   dapagliflozin  propanediol (FARXIGA) 10 MG TABS tablet, Take 1 tablet by mouth daily., Disp: 30 tablet, Rfl: 11   digoxin (LANOXIN) 0.125 MG tablet, TAKE 1 TABLET BY MOUTH DAILY, Disp: 90 tablet, Rfl: 3   Fluticasone-Umeclidin-Vilant (TRELEGY ELLIPTA) 200-62.5-25 MCG/ACT AEPB, Inhale 1 puff into the lungs daily., Disp: 60 each, Rfl: 0   guaiFENesin-dextromethorphan (ROBITUSSIN DM) 100-10 MG/5ML syrup, Take 5 mLs by mouth every 6 (six) hours as needed for cough., Disp: 118 mL, Rfl: 0   hydrOXYzine (VISTARIL) 25 MG capsule, TAKE 1 CAPSULE (25 MG TOTAL) BY MOUTH DAILY AS NEEDED., Disp: 90 capsule, Rfl: 1   ipratropium-albuterol (DUONEB) 0.5-2.5 (3) MG/3ML SOLN, Take 3 mLs by nebulization every 4 (four) hours as needed (for shortness of breath)., Disp: 90 mL, Rfl: 3   ivabradine (CORLANOR) 7.5 MG TABS tablet, TAKE 1 TABLET BY MOUTH TWICE A DAY WITH A MEAL, Disp: 60 tablet, Rfl: 0   metolazone (ZAROXOLYN) 2.5 MG tablet, Take 1 tablet (2.5 mg total) by mouth once a week. Take every Tuesday with extra 40 mEq of Potassium, Disp: , Rfl:    nitroGLYCERIN (NITROSTAT) 0.4 MG SL tablet, Place 1 tablet (0.4 mg total) under the tongue every 5 (five) minutes as needed for chest pain., Disp:  90 tablet, Rfl: 5   Nutritional Supplements (ENSURE ORIGINAL) LIQD, Take 2-3 Bottles by mouth daily., Disp: , Rfl:    potassium chloride (KLOR-CON) 10 MEQ tablet, Take 4 tablets (40 mEq total) by mouth 2 (two) times daily. Take extra 4 tablets (40 mEq total) with weekly Metolazone dose (Patient taking differently: Take 40 mEq by mouth 2 (two) times daily. Take extra 8 tablets (40 mEq total) with Metolazone dose), Disp: , Rfl:    spironolactone (ALDACTONE) 25 MG tablet, Take 0.5 tablets (12.5 mg total) by mouth at bedtime., Disp: , Rfl:    torsemide (DEMADEX) 20 MG tablet, Take 4 tablets (80 mg total) by mouth 2 (two) times daily., Disp: 240 tablet, Rfl: 6  Observations/Objective: Patient is well-developed, well-nourished in no acute distress.   Resting comfortably  at home.  Head is normocephalic, atraumatic.  No labored breathing.  Speech is clear and coherent with logical content.  Patient is alert and oriented at baseline.    Assessment and Plan: 1. COPD exacerbation (HCC)  - doxycycline (VIBRA-TABS) 100 MG tablet; Take 1 tablet (100 mg total) by mouth 2 (two) times daily for 7 days.  Dispense: 14 tablet; Refill: 0 - predniSONE (DELTASONE) 20 MG tablet; Take 1 tablet (20 mg total) by mouth 2 (two) times daily with a meal for 5 days.  Dispense: 10 tablet; Refill: 0  2. Lower respiratory infection  - doxycycline (VIBRA-TABS) 100 MG tablet; Take 1 tablet (100 mg total) by mouth 2 (two) times daily for 7 days.  Dispense: 14 tablet; Refill: 0 - predniSONE (DELTASONE) 20 MG tablet; Take 1 tablet (20 mg total) by mouth 2 (two) times daily with a meal for 5 days.  Dispense: 10 tablet; Refill: 0     Follow Up Instructions: I discussed the assessment and treatment plan with the patient. The patient was provided an opportunity to ask questions and all were answered. The patient agreed with the plan and demonstrated an understanding of the instructions.  A copy of instructions were sent to the patient via MyChart unless otherwise noted below.     The patient was advised to call back or seek an in-person evaluation if the symptoms worsen or if the condition fails to improve as anticipated.    Viviano Simas, FNP

## 2023-11-03 ENCOUNTER — Other Ambulatory Visit: Payer: Self-pay | Admitting: Family

## 2023-11-03 DIAGNOSIS — R4589 Other symptoms and signs involving emotional state: Secondary | ICD-10-CM

## 2023-11-05 ENCOUNTER — Ambulatory Visit: Payer: BC Managed Care – PPO | Attending: Cardiology

## 2023-11-05 ENCOUNTER — Telehealth: Payer: Self-pay

## 2023-11-05 DIAGNOSIS — Z95810 Presence of automatic (implantable) cardiac defibrillator: Secondary | ICD-10-CM

## 2023-11-05 DIAGNOSIS — I5022 Chronic systolic (congestive) heart failure: Secondary | ICD-10-CM

## 2023-11-05 NOTE — Progress Notes (Unsigned)
EPIC Encounter for ICM Monitoring  Patient Name: Brittany [name] is a 62 y.o. female Date: 11/05/2023 Primary Care Physican: Georganna Skeans, MD Primary Cardiologist: Bensimhon Electrophysiologist: Lalla Brothers 02/13/2023 Weight: 100-102 lbs 03/20/2023 Weight: 100 lbs 05/22/2023 Weight: 96 lbs 06/26/2023 Weight: 96-98 lbs 08/22/2023 Weight: 93 lbs 10/23/2023 Weight: 93-94 lbs     Attempted call to patient and unable to reach.   Transmission reviewed.     Optivol Thoracic impedance suggesting possible fluid accumulation starting 11/17.     Prescribed: Torsemide 20 mg take 4 tablets (80 mg total) by mouth twice a day.  10/16/23 She reports taking differently ... She is taking 80 mg every AM and 40 mg every evening.   Potassium 10 mEq take 4 tablets (40 mEq total) by mouth twice a day.  Take extra 4 tablets (40 mEq total) with weekly Metolazone.   Spironolactone 25 mg take 1 tablet (25 mg total) by mouth at bedtime Metolazone 2.5 mg take 1 tablet by mouth every week on Tuesday.  10/16/23 She reports taking differently ... She is taking PRN for symptoms.      Labs: 10/30/2023 Creatinine 1.50, BUN 20, Potassium 3.4, Sodium 138, GFR 39 10/19/2023 Creatinine 1.38, BUN 30, Potassium 3.3, Sodium 139, GFR 43 (extra potassium ordered by HF clinic) 10/11/2023 Creatinine 1.42, BUN 26, Potassium 3.5, Sodium 141  09/29/2023 Creatinine 1.74, BUN 78, Potassium 5.0, Sodium 131, GFR 33  09/28/2023 Creatinine 1.75, BUN 64, Potassium 3.8, Sodium 134, GFR 32  09/27/2023 Creatinine 1.68, BUN 48, Potassium 4.3, Sodium 136, GFR 34 09/26/2023 Creatinine 1.81, BUN 38, Potassium 4.2, Sodium 134, GFR 31  09/25/2023 Potassium 2.7, Sodium 138 (5:27 AM) 09/25/2023 Creatinine 1.60, BUN 24, Potassium 3.3, Sodium 140  09/11/2023 Creatinine 1.69, BUN 37, Potassium 3.5, Sodium 138, GFR 34 08/16/2023 Creatinine 1.43, BUN 29, Potassium 3.3, Sodium 139, GFR 41 (potassium addressed per lab note) A complete set of results can  be found in Results Review.   Recommendations:  Unable to reach.  Will send to HF clinic for review if patient is reached.   Follow-up plan: ICM clinic phone appointment on 11/13/2023 to recheck fluid levels.   91 day device clinic remote transmission 12/19/2023.     EP/Cardiology Office Visits:  11/21/2023 with HF clinic.  Missed 10/10/2023 with Dr Lalla Brothers and not rescheduled.   Copy of ICM check sent to Dr. Lalla Brothers.  3 month ICM trend: 11/05/2023.    12-14 Month ICM trend:     Karie Soda, RN 11/05/2023 4:27 PM

## 2023-11-05 NOTE — Telephone Encounter (Signed)
Remote ICM transmission received.  Attempted call to patient regarding ICM remote transmission and no answer or voice mail option.

## 2023-11-13 ENCOUNTER — Ambulatory Visit: Payer: BC Managed Care – PPO | Attending: Cardiology

## 2023-11-13 DIAGNOSIS — Z95810 Presence of automatic (implantable) cardiac defibrillator: Secondary | ICD-10-CM

## 2023-11-13 DIAGNOSIS — I5022 Chronic systolic (congestive) heart failure: Secondary | ICD-10-CM

## 2023-11-13 NOTE — Progress Notes (Signed)
EPIC Encounter for ICM Monitoring  Patient Name: Brittany [name] is a 62 y.o. female Date: 11/13/2023 Primary Care Physican: Georganna Skeans, MD Primary Cardiologist: Bensimhon Electrophysiologist: Lalla Brothers 02/13/2023 Weight: 100-102 lbs 03/20/2023 Weight: 100 lbs 05/22/2023 Weight: 96 lbs 06/26/2023 Weight: 96-98 lbs 08/22/2023 Weight: 93 lbs 10/23/2023 Weight: 93-94 lbs 11/13/2023 Weight: 94 lbs    Spoke with patient and heart failure questions reviewed.  Transmission results reviewed.  Pt asymptomatic for fluid accumulation.  Reports feeling well at this time and voices no complaints.  She is concerned she is unable to gain weight even after increasing caloric intake.   Optivol Thoracic impedance suggesting possible fluid accumulation starting 11/17.   Fluid index greater than normal threshold starting 11/27.   Prescribed: Torsemide 20 mg take 4 tablets (80 mg total) by mouth twice a day.  10/16/23 She reports taking differently ... She is taking 80 mg every AM and 40 mg every evening.   Potassium 10 mEq take 4 tablets (40 mEq total) by mouth twice a day.  Take extra 4 tablets (40 mEq total) with weekly Metolazone.   Spironolactone 25 mg take 0.5 tablet (12.5 mg total) by mouth at bedtime Metolazone 2.5 mg take 1 tablet by mouth every week on Tuesday.  11/13/23 She confirmed she is taking Weekly   Labs: 10/30/2023 Creatinine 1.50, BUN 20, Potassium 3.4, Sodium 138, GFR 39 10/19/2023 Creatinine 1.38, BUN 30, Potassium 3.3, Sodium 139, GFR 43 (extra potassium ordered by HF clinic) 10/11/2023 Creatinine 1.42, BUN 26, Potassium 3.5, Sodium 141  09/29/2023 Creatinine 1.74, BUN 78, Potassium 5.0, Sodium 131, GFR 33  09/28/2023 Creatinine 1.75, BUN 64, Potassium 3.8, Sodium 134, GFR 32  09/27/2023 Creatinine 1.68, BUN 48, Potassium 4.3, Sodium 136, GFR 34 09/26/2023 Creatinine 1.81, BUN 38, Potassium 4.2, Sodium 134, GFR 31  09/25/2023 Potassium 2.7, Sodium 138 (5:27 AM) 09/25/2023  Creatinine 1.60, BUN 24, Potassium 3.3, Sodium 140  09/11/2023 Creatinine 1.69, BUN 37, Potassium 3.5, Sodium 138, GFR 34 08/16/2023 Creatinine 1.43, BUN 29, Potassium 3.3, Sodium 139, GFR 41 (potassium addressed per lab note) A complete set of results can be found in Results Review.   Recommendations:  Pt will take weekly Metolazone on Thursday with Torsemide 80 mg twice a day for 2 days.  Will recheck fluid levels on 12/6.  She will need Metolazone refill at 12/11 OV.    Follow-up plan: ICM clinic phone appointment on 11/16/2023 (manual) to recheck fluid levels.   91 day device clinic remote transmission 12/19/2023.     EP/Cardiology Office Visits:  11/21/2023 with HF clinic.  Discussed rescheduling missed appt with Dr Lalla Brothers after the first of the year.  Missed 10/10/2023 with Dr Lalla Brothers and not rescheduled.   Copy of ICM check sent to Dr. Lalla Brothers.  3 month ICM trend: 11/13/2023.    12-14 Month ICM trend:     Karie Soda, RN 11/13/2023 11:03 AM

## 2023-11-14 ENCOUNTER — Encounter: Payer: Self-pay | Admitting: Internal Medicine

## 2023-11-19 NOTE — Progress Notes (Signed)
Advanced Heart Failure Clinic Note  PCP:  Georganna Skeans, MD  HF Cardiologist: Dr [name] Oncology: Dr Shirline Frees   Chief Complaint: Hospital follow-up  HPI: Brittany [name] is a 62 y.o.Mar[place] Kitchen female with a history of PAF,  previous smoker quit 2015  years ago, hypertension, previous small cell lung cancer treated with chemo, chest XRT and prophylactic brain radiation in 2015, CAD s/p CABG and chronic systolic HF EF ~25%  Admitted 08/24/7828 with NSTEMI and shock. Underwent emergent cath 02/03/18 showed LAD 100% stenosed, LCx 95% stenosed. Taken for emergent CABG 02/03/18. Required impella post op. Hospital course complicated by cardiogenic shock, HCAP, A fib, respiratory failure, and swallowing issues. She was discharged to SNF. Discharge weight 103 pounds.    In 2019 had multiple hospitalizations for HF and pleural effusion. Underwent pleurodesis at [facility].   Admitted 3/20 with NSTEMI and HF. Received DES to ostial ramus into distal left main based on cath below and underwent diuresis. Meds adjusted as tolerated. Echo with EF 25-30%.   Admitted 11/21 with COPD flare with mild HF and hemoptysis. CTA showed stable scarring in the right hilum consistent with emphysema.. Underwent bronchoscopy on 11/15, no obvious source of bleeding found on examination.   Echo 11/21 EF 20-25%. RV mildly HK.   Admitted 3/22 Echo with EF < 20%. Treated for AECOPD exacerbation.   Admitted 12/22 with R renal infarct felt to be cardio-embolic (no AF found). Started on Eliquis  Admitted 8/23 w/ a/c respiratory failure and CHF. Concern for low output vs PNA vs COPD exacerbation. PICC placed and milrinone started. Diuresed w/ IV Lasix. Given prednisone taper and doxy for COPD exacerbation. Echo showed EF 20-25%, global hypokinesis, GIIDD, RV systolic function, mildly reduced. RV size normal. Mild MR, trivial TR, aortic valve w/ mild-mod regurg.  Barostim placed.   Admitted 12/23 with COPDE, a/c CHF and  influenza +. Placed on BiPap, treated with tamiflu and diuresed with IV lasix. Empirically started milrinone over concern for low output  Admitted 5/24 with N/V. CT ok, wet prep + trich, treated with IV Flagyl.  HsTroponin elevated. Cards consulted felt elevated troponin 2/2 demand ischemia. Echo showed EF 20-25%, LV with global HK, RV mildly reduced, moderate AI. [name] and KCL stopped with AKI and she was discharged home, weight 101 lbs.  Admitted 8/24 with a/c CHF and AECOPD. Treated with steroids and duonebs and diuresed with IV lasix + metolazone. She was seen by Advanced Heart Failure. Discharged home on 80 mg Torsemide BID.  Admitted 10/24 with COPD + HF. This is the 3rd admit in 6 months. Diuresed with IV lasix 7 metolazone, required BiPap and steroids. Echo showed EF 20-25%, G2DD, RV mildly reduced. Able to wean to 3L [place]. She was discharged home, weight 91 lbs. She remained DNR during hospitalization.  Post hospital follow up 10/24, she reduced her diuretic dose due to frequent urination. NYHA III and volume up, weight up 8 lbs.Torsemide increased back to 80 bid, added weekly metolazone/extra KCL  Today she returns for close HF follow up. Overall feeling fatigued and weak. She has not taken metolazone, as recommended last visit. She has planned to take metolazone PRN. Legs are heavy and weak, + cough. She has SOB walking short distances, when she has fluid. Denies palpitations CP, dizziness, edema, or PND/Orthopnea. Appetite poor, drinking Ensures. No fever or chills. She is "tired of being tired."   Today she returns for HF follow up. Overall feeling fair. On steroids + doxy for COPD  exac. Had a rough 2 nights, had to sleep with her oxygen at night, used BiPap one night. Feels better today. She is SOB with ADLs, now has HH aide 3 days/week. Denies palpitations, CP, dizziness, edema, or PND/Orthopnea. Appetite fair, drinking Ensures. No fever or chills. Weight at home 95 pounds. Taking all  medications. Now wearing 2L oxygen at night.   Cardidac Studies: - Echo (10/24): EF 20-25%, G2DD, RV mildly reduced.  - Echo (5/24): EF 20-25%, LV with global HK, RV mildly reduced, moderate AI.  - Echo (7/23): EF 20-25%, global hypokinesis, GIIDD, RV systolic function, mildly reduced. RV size normal. Mild MR, trivial TR, aortic valve w/ mild-mod regurg  - Echo 1/23: EF 20-25%, severely reduced LV with global HK, grade II DD, normal RV  - Echo 02/10/21: EF <20%, G2DD  - Echo 2022: EF < 20% RV normal   - Echo 2021: EF 20-25% RV mildly reduced.   - Echo 02/12/19: EF 25-30%, RV mildly reduced  - 05/18/2021 PFT's: moderately severe airway obstruction and a diffusion defect suggesting emphysema.   - LHC 02/12/19: Ost LAD to Prox LAD lesion is 100% stenosed. Ost Cx to Prox Cx lesion is 100% stenosed. Ost Ramus lesion is 99% stenosed. Post intervention, there is a 0% residual stenosis. A drug-eluting stent was successfully placed using a STENT RESOLUTE ONYX 2.25X15. SVG and is normal in caliber. The graft exhibits no disease. Ost 1st Diag lesion is 70% stenosed. SVG and is normal in caliber. Prox Graft lesion is 100% stenosed. 1.  Significant underlying two-vessel coronary artery disease with patent SVG to OM and SVG to diagonal which supplies the LAD territory.  Atretic LIMA to LAD given competitive flow from SVG to diagonal.  Severe ostial ramus artery stenosis not supplied by bypass with his area suggestive of plaque rupture. 2.  Severely elevated left ventricular end-diastolic pressure 34 mmHg.  Left ventricular angiography was not performed. 3.  Successful angioplasty and drug-eluting stent placement to the ostial ramus extending into the distal left main.  - RHC 2019  RA = 8 RV = 49/9 PA = 47/20 (32) PCW = 19 Fick cardiac output/index = 4.3/3.1 PVR = 3.0 wu AO sat = 93% PA sat = 62%, 64%   1. Minimally elevated left heart pressures 2. Mild PAH 3. Normal cardiac output  Past  Medical History:  Diagnosis Date   Acute respiratory failure (HCC) 05/05/2018   Acute systolic congestive heart failure (HCC) 02/03/2018   AICD (automatic cardioverter/defibrillator) present    Allergy    Anxiety    Asthma    DM2 (diabetes mellitus, type 2) (HCC) 10/20/2020   Hypertension    PAF (paroxysmal atrial fibrillation) (HCC)    Presence of permanent cardiac pacemaker    Prophylactic measure 08/03/14-08/19/14   Prophyl. 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   LIMA to LAD, SVG to D1, SVG to OM1, EVH via right thigh with implantation of Impella LD LVAD via direct aortic approach   Small cell lung cancer (HCC) 03/16/2014   Past Surgical History:  Procedure Laterality Date   BRONCHIAL BRUSHINGS  10/25/2020   Procedure: BRONCHIAL BRUSHINGS;  Surgeon: Leslye Peer, MD;  Location: [facility] ENDOSCOPY;  Service: Pulmonary;;   BRONCHIAL NEEDLE ASPIRATION BIOPSY  10/25/2020   Procedure: BRONCHIAL NEEDLE ASPIRATION BIOPSIES;  Surgeon: Leslye Peer, MD;  Location: [facility] ENDOSCOPY;  Service: Pulmonary;;   CARDIAC DEFIBRILLATOR PLACEMENT  08/15/2018   MDT Visia AF MRI VR ICD  implanted by Dr Georgena Spurling for primary prevention of sudden   CESAREAN SECTION     CORONARY ARTERY BYPASS GRAFT N/A 02/03/2018   Procedure: CORONARY ARTERY BYPASS GRAFTING (CABG);  Surgeon: Purcell Nails, MD;  Location: [facility] OR;  Service: Open Heart Surgery;  Laterality: N/A;  Time 3 using left internal mammary artery and endoscopically harvested right saphenous vein   CORONARY BALLOON ANGIOPLASTY N/A 02/03/2018   Procedure: CORONARY BALLOON ANGIOPLASTY;  Surgeon: Swaziland, Peter M, MD;  Location: [facility] INVASIVE CV LAB;  Service: Cardiovascular;  Laterality: N/A;   CORONARY STENT INTERVENTION N/A 02/12/2019   Procedure: CORONARY STENT INTERVENTION;  Surgeon: Iran Ouch, MD;  Location: [facility] INVASIVE CV LAB;  Service: Cardiovascular;  Laterality: N/A;   CORONARY/GRAFT ACUTE MI REVASCULARIZATION N/A 02/03/2018    Procedure: Coronary/Graft Acute MI Revascularization;  Surgeon: Swaziland, Peter M, MD;  Location: [facility] INVASIVE CV LAB;  Service: Cardiovascular;  Laterality: N/A;   ENDOBRONCHIAL ULTRASOUND N/A 10/25/2020   Procedure: ENDOBRONCHIAL ULTRASOUND;  Surgeon: Leslye Peer, MD;  Location: [facility] ENDOSCOPY;  Service: Pulmonary;  Laterality: N/A;   FLEXIBLE BRONCHOSCOPY  10/25/2020   Procedure: FLEXIBLE BRONCHOSCOPY;  Surgeon: Leslye Peer, MD;  Location: [facility] ENDOSCOPY;  Service: Pulmonary;;   IABP INSERTION N/A 02/03/2018   Procedure: IABP Insertion;  Surgeon: Swaziland, Peter M, MD;  Location: [facility] INVASIVE CV LAB;  Service: Cardiovascular;  Laterality: N/A;   INTRAOPERATIVE TRANSESOPHAGEAL ECHOCARDIOGRAM N/A 02/03/2018   Procedure: INTRAOPERATIVE TRANSESOPHAGEAL ECHOCARDIOGRAM;  Surgeon: Purcell Nails, MD;  Location: [facility] OR;  Service: Open Heart Surgery;  Laterality: N/A;   LEFT HEART CATH AND CORONARY ANGIOGRAPHY N/A 02/03/2018   Procedure: LEFT HEART CATH AND CORONARY ANGIOGRAPHY;  Surgeon: Swaziland, Peter M, MD;  Location: [facility] INVASIVE CV LAB;  Service: Cardiovascular;  Laterality: N/A;   LEFT HEART CATH AND CORS/GRAFTS ANGIOGRAPHY N/A 02/12/2019   Procedure: LEFT HEART CATH AND CORS/GRAFTS ANGIOGRAPHY;  Surgeon: Iran Ouch, MD;  Location: [facility] INVASIVE CV LAB;  Service: Cardiovascular;  Laterality: N/A;   MEDIASTINOSCOPY N/A 03/11/2014   Procedure: MEDIASTINOSCOPY;  Surgeon: Loreli Slot, MD;  Location: [facility] OR;  Service: Thoracic;  Laterality: N/A;   PLACEMENT OF IMPELLA LEFT VENTRICULAR ASSIST DEVICE  02/03/2018   Procedure: PLACEMENT OF IMPELLA LEFT VENTRICULAR ASSIST DEVICE LD;  Surgeon: Purcell Nails, MD;  Location: [facility] OR;  Service: Open Heart Surgery;;   REMOVAL OF IMPELLA LEFT VENTRICULAR ASSIST DEVICE N/A 02/08/2018   Procedure: REMOVAL OF IMPELLA LEFT VENTRICULAR ASSIST DEVICE;  Surgeon: Purcell Nails, MD;  Location: [facility] OR;  Service: Open Heart Surgery;  Laterality: N/A;   RIGHT HEART CATH  N/A 02/03/2018   Procedure: RIGHT HEART CATH;  Surgeon: Swaziland, Peter M, MD;  Location: [facility] INVASIVE CV LAB;  Service: Cardiovascular;  Laterality: N/A;   RIGHT HEART CATH N/A 05/09/2018   Procedure: RIGHT HEART CATH;  Surgeon: Dolores Patty, MD;  Location: [facility] INVASIVE CV LAB;  Service: Cardiovascular;  Laterality: N/A;   TEE WITHOUT CARDIOVERSION N/A 02/08/2018   Procedure: TRANSESOPHAGEAL ECHOCARDIOGRAM (TEE);  Surgeon: Purcell Nails, MD;  Location: [facility] OR;  Service: Open Heart Surgery;  Laterality: N/A;   TUBAL LIGATION     VIDEO BRONCHOSCOPY WITH ENDOBRONCHIAL ULTRASOUND N/A 03/11/2014   Procedure: VIDEO BRONCHOSCOPY WITH ENDOBRONCHIAL ULTRASOUND;  Surgeon: Loreli Slot, MD;  Location: [facility] OR;  Service: Thoracic;  Laterality: N/A;   Current Outpatient Medications  Medication Sig Dispense Refill   albuterol (VENTOLIN HFA) 108 (90 Base) MCG/ACT inhaler Inhale 2  puffs into the lungs every 6 (six) hours as needed for wheezing or shortness of breath. 8.5 g 1   apixaban (ELIQUIS) 2.5 MG TABS tablet Take 1 tablet (2.5 mg total) by mouth 2 (two) times daily. 180 tablet 1   atorvastatin (LIPITOR) 80 MG tablet Take 1 tablet (80 mg total) by mouth daily. 90 tablet 0   cyclobenzaprine (FLEXERIL) 10 MG tablet Take 1 tablet (10 mg total) by mouth 3 (three) times daily as needed for muscle spasms. 60 tablet 3   dapagliflozin propanediol (FARXIGA) 10 MG TABS tablet Take 1 tablet by mouth daily. 30 tablet 11   digoxin (LANOXIN) 0.125 MG tablet TAKE 1 TABLET BY MOUTH DAILY 90 tablet 3   Fluticasone-Umeclidin-Vilant (TRELEGY ELLIPTA) 200-62.5-25 MCG/ACT AEPB Inhale 1 puff into the lungs daily. 60 each 0   guaiFENesin-dextromethorphan (ROBITUSSIN DM) 100-10 MG/5ML syrup Take 5 mLs by mouth every 6 (six) hours as needed for cough. 118 mL 0   hydrOXYzine (VISTARIL) 25 MG capsule TAKE 1 CAPSULE (25 MG TOTAL) BY MOUTH DAILY AS NEEDED. 90 capsule 1   ipratropium-albuterol (DUONEB) 0.5-2.5 (3) MG/3ML SOLN  Take 3 mLs by nebulization every 4 (four) hours as needed (for shortness of breath). 90 mL 3   ivabradine (CORLANOR) 7.5 MG TABS tablet TAKE 1 TABLET BY MOUTH TWICE A DAY WITH A MEAL 60 tablet 0   metolazone (ZAROXOLYN) 2.5 MG tablet Take 1 tablet (2.5 mg total) by mouth once a week. Take every Tuesday with extra 40 mEq of Potassium     nitroGLYCERIN (NITROSTAT) 0.4 MG SL tablet Place 1 tablet (0.4 mg total) under the tongue every 5 (five) minutes as needed for chest pain. 90 tablet 5   Nutritional Supplements (ENSURE ORIGINAL) LIQD Take 2-3 Bottles by mouth daily.     potassium chloride (KLOR-CON) 10 MEQ tablet Take 4 tablets (40 mEq total) by mouth 2 (two) times daily. Take extra 4 tablets (40 mEq total) with weekly Metolazone dose (Patient taking differently: Take 40 mEq by mouth 2 (two) times daily. Take extra 8 tablets (40 mEq total) with Metolazone dose)     spironolactone (ALDACTONE) 25 MG tablet Take 0.5 tablets (12.5 mg total) by mouth at bedtime.     torsemide (DEMADEX) 20 MG tablet Take 4 tablets (80 mg total) by mouth 2 (two) times daily. 240 tablet 6   No current facility-administered medications for this encounter.    Allergies:   Lactose intolerance (gi) and Codeine   Social History:  The patient  reports that she quit smoking about 9 years ago. Her smoking use included cigarettes. She started smoking about 29 years ago. She has a 20 pack-year smoking history. She has never used smokeless tobacco. She reports current alcohol use of about 5.0 standard drinks of alcohol per week. She reports current drug use. Drug: Marijuana.   Family History:  The patient's family history includes Cancer in her maternal grandmother; Diabetes in her paternal grandmother; Heart attack in her mother; Heart disease in her mother; Hypertension in her maternal grandmother and mother.   ROS:  Please see the history of present illness.   All other systems are personally reviewed and negative.    Recent  Labs: 08/03/2023: TSH 0.787 09/25/2023: ALT 21 09/29/2023: Magnesium 2.4 10/09/2023: Hemoglobin 14.0; Platelets 235 10/30/2023: B Natriuretic Peptide 1,854.8; BUN 20; Creatinine, Ser 1.50; Potassium 3.4; Sodium 138  Personally reviewed   Wt Readings from Last 3 Encounters:  11/21/23 43.6 kg (96 lb 3.2 oz)  10/30/23 43.1  kg (95 lb)  10/11/23 43.1 kg (95 lb)   BP 96/64   Pulse (!) 106   Wt 43.6 kg (96 lb 3.2 oz)   SpO2 97%   BMI 19.43 kg/m   Physical Exam General:  NAD. No resp difficulty, walked into clinic, chronically-ill appearing HEENT: Normal Neck: Supple. JVP 10, Carotids 2+ bilat; no bruits. No lymphadenopathy or thryomegaly appreciated. Cor: PMI nondisplaced. Regular rate & rhythm. No rubs, gallops or murmurs. Lungs: Clear, diminished, faint wheeze Abdomen: Soft, nontender, nondistended. No hepatosplenomegaly. No bruits or masses. Good bowel sounds. Extremities: No cyanosis, clubbing, rash, edema Neuro: Alert & oriented x 3, cranial nerves grossly intact. Moves all 4 extremities w/o difficulty. Affect pleasant.  Device interrogation (personally reviewed): unable to interrogate device today  REDs: 37%  Assessment & Plan: 1. Acute on chronic Systolic Heart Failure - EF has been 20-25% for the last 5 years.  - S/p Medtronic ICD and Barostim - Echo 5/24: EF 20-25%, RV mildly reduced - Echo 10/24: EF 20-25%, RV mildly reduced - NYHA III-IIIb, functional class confounded by deconditioning and COPD. Volume up today on exam and ReDs 37% - Increase metolazone 2.5 mg + extra 40 KCL to 2x/week - Continue torsemide 80 mg bid + 40 KCL bid - Continue Farxiga 10 mg daily. - Continue digoxin 0.125 mg daily. - Continue ivabradine 7.5 mg bid. - Continue spironolactone 12.5 mg daily.  - Not a candidate for advanced therapies due to severity of lung disease.  - She is enrolled in monthly iCM - Labs today.  2. Chronic Hypoxic Respiratory Failure/ AECOPD  - Hx frequent admissions  for AE COPD + A/C HFrEF.  - No longer on oxygen. - Continue inhalers. - Lungs sound better today.   3. CKD Stage IIIb  - Creatinine baseline ~ 1.4-1.6 - Continue SGLT2i - Labs today.   4. PAF  - Regular on exam today. - Continue Eliquis 2.5 mg bid. Reduced dose (weight/creatinine).   5. CAD - History of CABG x 2 2019 - s/p DES ostial ramus extending to left main (2020) - No chest pain.  - Continue atorvastatin - No ASA with need for AC   6. H/o SCLC - Completed treatment 2015.  - CT chest (5/24) soft tissue thickening along mediastinum but no discrete mass    7. H/o Renal Infarct 2022 - On Eliquis. No bleeding issues.  8. GOC - She has had a significant functional decline. - Consider GOC conversations - She was DNR during last hospitalization  Follow up with Dr [name], as scheduled.  [name], FNP-BC 11/21/23

## 2023-11-20 ENCOUNTER — Other Ambulatory Visit (HOSPITAL_COMMUNITY): Payer: Self-pay | Admitting: Internal Medicine

## 2023-11-20 NOTE — Progress Notes (Signed)
No ICM remote transmission received for 11/16/2023 (OV 12/11 for follow up) and next ICM transmission scheduled for 12/02/2022.

## 2023-11-21 ENCOUNTER — Ambulatory Visit (HOSPITAL_COMMUNITY)
Admission: RE | Admit: 2023-11-21 | Discharge: 2023-11-21 | Disposition: A | Discharge status: Home / Self Care | Payer: BC Managed Care – PPO | Source: Ambulatory Visit | Attending: Family Medicine | Admitting: Family Medicine

## 2023-11-21 ENCOUNTER — Encounter (HOSPITAL_COMMUNITY): Payer: Self-pay

## 2023-11-21 VITALS — BP 96/64 | HR 106 | Wt 96.2 lb

## 2023-11-21 DIAGNOSIS — I2510 Atherosclerotic heart disease of native coronary artery without angina pectoris: Secondary | ICD-10-CM | POA: Diagnosis not present

## 2023-11-21 DIAGNOSIS — N1832 Chronic kidney disease, stage 3b: Secondary | ICD-10-CM | POA: Insufficient documentation

## 2023-11-21 DIAGNOSIS — Z85118 Personal history of other malignant neoplasm of bronchus and lung: Secondary | ICD-10-CM | POA: Insufficient documentation

## 2023-11-21 DIAGNOSIS — N183 Chronic kidney disease, stage 3 unspecified: Secondary | ICD-10-CM

## 2023-11-21 DIAGNOSIS — I10 Essential (primary) hypertension: Secondary | ICD-10-CM | POA: Diagnosis present

## 2023-11-21 DIAGNOSIS — J449 Chronic obstructive pulmonary disease, unspecified: Secondary | ICD-10-CM | POA: Insufficient documentation

## 2023-11-21 DIAGNOSIS — I252 Old myocardial infarction: Secondary | ICD-10-CM | POA: Insufficient documentation

## 2023-11-21 DIAGNOSIS — I480 Paroxysmal atrial fibrillation: Secondary | ICD-10-CM | POA: Diagnosis present

## 2023-11-21 DIAGNOSIS — Z7901 Long term (current) use of anticoagulants: Secondary | ICD-10-CM | POA: Insufficient documentation

## 2023-11-21 DIAGNOSIS — Z951 Presence of aortocoronary bypass graft: Secondary | ICD-10-CM | POA: Diagnosis not present

## 2023-11-21 DIAGNOSIS — Z87891 Personal history of nicotine dependence: Secondary | ICD-10-CM | POA: Insufficient documentation

## 2023-11-21 DIAGNOSIS — Z923 Personal history of irradiation: Secondary | ICD-10-CM | POA: Insufficient documentation

## 2023-11-21 DIAGNOSIS — N280 Ischemia and infarction of kidney: Secondary | ICD-10-CM

## 2023-11-21 DIAGNOSIS — I5022 Chronic systolic (congestive) heart failure: Secondary | ICD-10-CM

## 2023-11-21 DIAGNOSIS — Z9221 Personal history of antineoplastic chemotherapy: Secondary | ICD-10-CM | POA: Diagnosis not present

## 2023-11-21 DIAGNOSIS — I5023 Acute on chronic systolic (congestive) heart failure: Secondary | ICD-10-CM | POA: Diagnosis not present

## 2023-11-21 DIAGNOSIS — J9611 Chronic respiratory failure with hypoxia: Secondary | ICD-10-CM

## 2023-11-21 DIAGNOSIS — I130 Hypertensive heart and chronic kidney disease with heart failure and stage 1 through stage 4 chronic kidney disease, or unspecified chronic kidney disease: Secondary | ICD-10-CM | POA: Diagnosis not present

## 2023-11-21 DIAGNOSIS — C3490 Malignant neoplasm of unspecified part of unspecified bronchus or lung: Secondary | ICD-10-CM

## 2023-11-21 LAB — BASIC METABOLIC PANEL
Anion gap: 13 (ref 5–15)
BUN: 25 mg/dL — ABNORMAL HIGH (ref 8–23)
CO2: 24 mmol/L (ref 22–32)
Calcium: 9.8 mg/dL (ref 8.9–10.3)
Chloride: 101 mmol/L (ref 98–111)
Creatinine, Ser: 1.05 mg/dL — ABNORMAL HIGH (ref 0.44–1.00)
GFR, Estimated: 60 mL/min — ABNORMAL LOW (ref >60)
Glucose, Bld: 142 mg/dL — ABNORMAL HIGH (ref 70–99)
Potassium: 3.1 mmol/L — ABNORMAL LOW (ref 3.5–5.1)
Sodium: 138 mmol/L (ref 135–145)

## 2023-11-21 LAB — MAGNESIUM: Magnesium: 2.3 mg/dL (ref 1.7–2.4)

## 2023-11-21 LAB — BRAIN NATRIURETIC PEPTIDE: B Natriuretic Peptide: 1483.7 pg/mL — ABNORMAL HIGH (ref 0.0–100.0)

## 2023-11-21 MED ORDER — METOLAZONE 2.5 MG PO TABS: 2.5000 mg | ORAL_TABLET | ORAL | 6 refills | Status: DC

## 2023-11-21 NOTE — Patient Instructions (Signed)
Increase metolazone to 2.5 mg twice weekly on Tuesday and Thursday with 40 meq of potassium on those days. Labs today - will call you if abnormal. Return to see Dr. [name] in one month - see below. Please call us at [phone] if any questions or concerns prior to that visit.

## 2023-11-21 NOTE — Progress Notes (Signed)
ReDS Vest / Clip - 11/21/23 1200       ReDS Vest / Clip   Station Marker A    Ruler Value 23    ReDS Value Range Moderate volume overload    ReDS Actual Value 37

## 2023-11-26 ENCOUNTER — Telehealth (HOSPITAL_COMMUNITY): Payer: Self-pay | Admitting: *Deleted

## 2023-11-26 DIAGNOSIS — E876 Hypokalemia: Secondary | ICD-10-CM

## 2023-11-26 DIAGNOSIS — I5022 Chronic systolic (congestive) heart failure: Secondary | ICD-10-CM

## 2023-11-26 MED ORDER — POTASSIUM CHLORIDE ER 10 MEQ PO TBCR: 80.0000 meq | EXTENDED_RELEASE_TABLET | Freq: Two times a day (BID) | ORAL | 11 refills | Status: DC

## 2023-11-26 NOTE — Telephone Encounter (Signed)
Called patient per Brittany Rome, NP with following lab results and instructions:  "K is consistently low. Please make sure she is taking EXTRA 40 KCL on metolazone days.   Please increase KCL to 80 bid. Repeat BMET in 1 week"  Pt verbalized understanding of same. Updated Rx sent, lab ordered and schedule.

## 2023-12-06 NOTE — Progress Notes (Signed)
No ICM remote transmission received for 12/03/2023 and next ICM transmission scheduled for 12/24/2023.

## 2023-12-07 ENCOUNTER — Other Ambulatory Visit (HOSPITAL_COMMUNITY): Payer: Self-pay | Admitting: Adult Health

## 2023-12-07 DIAGNOSIS — J9611 Chronic respiratory failure with hypoxia: Secondary | ICD-10-CM

## 2023-12-07 DIAGNOSIS — I480 Paroxysmal atrial fibrillation: Secondary | ICD-10-CM

## 2023-12-10 ENCOUNTER — Ambulatory Visit: Payer: BC Managed Care – PPO

## 2023-12-10 ENCOUNTER — Ambulatory Visit
Admission: RE | Admit: 2023-12-10 | Discharge: 2023-12-10 | Disposition: A | Discharge status: Home / Self Care | Payer: BC Managed Care – PPO | Source: Ambulatory Visit | Attending: Cardiology | Admitting: Cardiology

## 2023-12-10 ENCOUNTER — Telehealth: Payer: Self-pay

## 2023-12-10 DIAGNOSIS — E876 Hypokalemia: Secondary | ICD-10-CM | POA: Insufficient documentation

## 2023-12-10 DIAGNOSIS — I5022 Chronic systolic (congestive) heart failure: Secondary | ICD-10-CM

## 2023-12-10 DIAGNOSIS — Z95810 Presence of automatic (implantable) cardiac defibrillator: Secondary | ICD-10-CM | POA: Diagnosis not present

## 2023-12-10 LAB — BASIC METABOLIC PANEL
Anion gap: 12 (ref 5–15)
BUN: 17 mg/dL (ref 8–23)
CO2: 24 mmol/L (ref 22–32)
Calcium: 10.1 mg/dL (ref 8.9–10.3)
Chloride: 101 mmol/L (ref 98–111)
Creatinine, Ser: 1.2 mg/dL — ABNORMAL HIGH (ref 0.44–1.00)
GFR, Estimated: 51 mL/min — ABNORMAL LOW (ref >60)
Glucose, Bld: 153 mg/dL — ABNORMAL HIGH (ref 70–99)
Potassium: 3.5 mmol/L (ref 3.5–5.1)
Sodium: 137 mmol/L (ref 135–145)

## 2023-12-10 NOTE — Progress Notes (Signed)
EPIC Encounter for ICM Monitoring  Patient Name: Brittany [name] is a 62 y.o. female Date: 12/10/2023 Primary Care Physican: Georganna Skeans, MD Primary Cardiologist: Bensimhon Electrophysiologist: Lalla Brothers 02/13/2023 Weight: 100-102 lbs 03/20/2023 Weight: 100 lbs 05/22/2023 Weight: 96 lbs 06/26/2023 Weight: 96-98 lbs 08/22/2023 Weight: 93 lbs 10/23/2023 Weight: 93-94 lbs 11/13/2023 Weight: 94 lbs    Attempted call to patient and unable to reach.  Transmission results reviewed.    Optivol Thoracic impedance suggesting possible fluid accumulation starting 12/19.   Fluid index greater than normal threshold starting 11/27.   Prescribed: Torsemide 20 mg take 4 tablets (80 mg total) by mouth twice a day.     Potassium 10 mEq take 8 tablets (80 mEq total) by mouth twice a day.  Take extra 4 tablets (40 mEq total) with weekly Metolazone.   Spironolactone 25 mg take 0.5 tablet (12.5 mg total) by mouth at bedtime Metolazone 2.5 mg take 1 tablet by mouth twice a week on Tues & Thurs with extra 40 mEq Potassium with each dosage.     Labs: 12/10/2023 Creatinine 1.20, BUN 17, Potassium 3.5, Sodium 137, GFR 51 11/21/2023 Creatinine 1.05, BUN 25, Potassium 3.1, Sodium 138, GFR 60 10/30/2023 Creatinine 1.50, BUN 20, Potassium 3.4, Sodium 138, GFR 39 10/19/2023 Creatinine 1.38, BUN 30, Potassium 3.3, Sodium 139, GFR 43 (extra potassium ordered by HF clinic) 10/11/2023 Creatinine 1.42, BUN 26, Potassium 3.5, Sodium 141  09/29/2023 Creatinine 1.74, BUN 78, Potassium 5.0, Sodium 131, GFR 33  09/28/2023 Creatinine 1.75, BUN 64, Potassium 3.8, Sodium 134, GFR 32  A complete set of results can be found in Results Review.   Recommendations:  Unable to reach.     Follow-up plan: ICM clinic phone appointment on 12/16/2022 (manual) to recheck fluid levels.   91 day device clinic remote transmission 12/19/2023.     EP/Cardiology Office Visits:  12/21/2023 with Dr [name].  Will schedule missed appt with Dr  Lalla Brothers after the first of the year.  Missed 10/10/2023 with Dr Lalla Brothers and not rescheduled.   Copy of ICM check sent to Dr. Lalla Brothers.  3 month ICM trend: 12/10/2023.    12-14 Month ICM trend:     Karie Soda, RN 12/10/2023 4:20 PM

## 2023-12-10 NOTE — Telephone Encounter (Signed)
Remote ICM transmission received.  Attempted call to patient regarding ICM remote transmission and no answer or voice mail option.  

## 2023-12-14 ENCOUNTER — Encounter (HOSPITAL_COMMUNITY): Payer: Self-pay

## 2023-12-14 ENCOUNTER — Emergency Department (HOSPITAL_COMMUNITY): Payer: BC Managed Care – PPO

## 2023-12-14 ENCOUNTER — Inpatient Hospital Stay (HOSPITAL_COMMUNITY)
Admission: EM | Admit: 2023-12-14 | Discharge: 2023-12-22 | DRG: 189 | Disposition: A | Discharge status: Home / Self Care | Payer: BC Managed Care – PPO | Attending: Internal Medicine | Admitting: Internal Medicine

## 2023-12-14 DIAGNOSIS — I2781 Cor pulmonale (chronic): Secondary | ICD-10-CM | POA: Diagnosis present

## 2023-12-14 DIAGNOSIS — Z9981 Dependence on supplemental oxygen: Secondary | ICD-10-CM

## 2023-12-14 DIAGNOSIS — Z91011 Allergy to milk products: Secondary | ICD-10-CM

## 2023-12-14 DIAGNOSIS — Z95810 Presence of automatic (implantable) cardiac defibrillator: Secondary | ICD-10-CM

## 2023-12-14 DIAGNOSIS — J9621 Acute and chronic respiratory failure with hypoxia: Secondary | ICD-10-CM | POA: Diagnosis not present

## 2023-12-14 DIAGNOSIS — Z9221 Personal history of antineoplastic chemotherapy: Secondary | ICD-10-CM

## 2023-12-14 DIAGNOSIS — Z87891 Personal history of nicotine dependence: Secondary | ICD-10-CM

## 2023-12-14 DIAGNOSIS — Z833 Family history of diabetes mellitus: Secondary | ICD-10-CM

## 2023-12-14 DIAGNOSIS — Z8249 Family history of ischemic heart disease and other diseases of the circulatory system: Secondary | ICD-10-CM

## 2023-12-14 DIAGNOSIS — Z515 Encounter for palliative care: Secondary | ICD-10-CM

## 2023-12-14 DIAGNOSIS — N179 Acute kidney failure, unspecified: Secondary | ICD-10-CM | POA: Diagnosis present

## 2023-12-14 DIAGNOSIS — Z923 Personal history of irradiation: Secondary | ICD-10-CM

## 2023-12-14 DIAGNOSIS — Z681 Body mass index (BMI) 19 or less, adult: Secondary | ICD-10-CM

## 2023-12-14 DIAGNOSIS — Z8679 Personal history of other diseases of the circulatory system: Secondary | ICD-10-CM

## 2023-12-14 DIAGNOSIS — I25810 Atherosclerosis of coronary artery bypass graft(s) without angina pectoris: Secondary | ICD-10-CM | POA: Diagnosis present

## 2023-12-14 DIAGNOSIS — Z7901 Long term (current) use of anticoagulants: Secondary | ICD-10-CM

## 2023-12-14 DIAGNOSIS — I5043 Acute on chronic combined systolic (congestive) and diastolic (congestive) heart failure: Secondary | ICD-10-CM | POA: Diagnosis present

## 2023-12-14 DIAGNOSIS — Z809 Family history of malignant neoplasm, unspecified: Secondary | ICD-10-CM

## 2023-12-14 DIAGNOSIS — Z8673 Personal history of transient ischemic attack (TIA), and cerebral infarction without residual deficits: Secondary | ICD-10-CM

## 2023-12-14 DIAGNOSIS — E43 Unspecified severe protein-calorie malnutrition: Secondary | ICD-10-CM | POA: Diagnosis present

## 2023-12-14 DIAGNOSIS — Z66 Do not resuscitate: Secondary | ICD-10-CM | POA: Diagnosis present

## 2023-12-14 DIAGNOSIS — R0902 Hypoxemia: Principal | ICD-10-CM

## 2023-12-14 DIAGNOSIS — J189 Pneumonia, unspecified organism: Secondary | ICD-10-CM | POA: Diagnosis present

## 2023-12-14 DIAGNOSIS — E1122 Type 2 diabetes mellitus with diabetic chronic kidney disease: Secondary | ICD-10-CM | POA: Diagnosis present

## 2023-12-14 DIAGNOSIS — N1832 Chronic kidney disease, stage 3b: Secondary | ICD-10-CM | POA: Diagnosis present

## 2023-12-14 DIAGNOSIS — N280 Ischemia and infarction of kidney: Secondary | ICD-10-CM | POA: Diagnosis present

## 2023-12-14 DIAGNOSIS — G4733 Obstructive sleep apnea (adult) (pediatric): Secondary | ICD-10-CM | POA: Diagnosis present

## 2023-12-14 DIAGNOSIS — J9601 Acute respiratory failure with hypoxia: Secondary | ICD-10-CM | POA: Diagnosis present

## 2023-12-14 DIAGNOSIS — I130 Hypertensive heart and chronic kidney disease with heart failure and stage 1 through stage 4 chronic kidney disease, or unspecified chronic kidney disease: Secondary | ICD-10-CM | POA: Diagnosis present

## 2023-12-14 DIAGNOSIS — J9622 Acute and chronic respiratory failure with hypercapnia: Secondary | ICD-10-CM | POA: Diagnosis present

## 2023-12-14 DIAGNOSIS — B974 Respiratory syncytial virus as the cause of diseases classified elsewhere: Secondary | ICD-10-CM | POA: Diagnosis present

## 2023-12-14 DIAGNOSIS — F410 Panic disorder [episodic paroxysmal anxiety] without agoraphobia: Secondary | ICD-10-CM | POA: Diagnosis present

## 2023-12-14 DIAGNOSIS — Z1152 Encounter for screening for COVID-19: Secondary | ICD-10-CM

## 2023-12-14 DIAGNOSIS — R570 Cardiogenic shock: Secondary | ICD-10-CM | POA: Diagnosis present

## 2023-12-14 DIAGNOSIS — Z5982 Transportation insecurity: Secondary | ICD-10-CM

## 2023-12-14 DIAGNOSIS — I480 Paroxysmal atrial fibrillation: Secondary | ICD-10-CM | POA: Diagnosis present

## 2023-12-14 DIAGNOSIS — I5022 Chronic systolic (congestive) heart failure: Secondary | ICD-10-CM

## 2023-12-14 DIAGNOSIS — R54 Age-related physical debility: Secondary | ICD-10-CM | POA: Diagnosis present

## 2023-12-14 DIAGNOSIS — I2510 Atherosclerotic heart disease of native coronary artery without angina pectoris: Secondary | ICD-10-CM | POA: Diagnosis present

## 2023-12-14 DIAGNOSIS — R0603 Acute respiratory distress: Secondary | ICD-10-CM | POA: Diagnosis not present

## 2023-12-14 DIAGNOSIS — I252 Old myocardial infarction: Secondary | ICD-10-CM

## 2023-12-14 DIAGNOSIS — E876 Hypokalemia: Secondary | ICD-10-CM | POA: Diagnosis present

## 2023-12-14 DIAGNOSIS — Z885 Allergy status to narcotic agent status: Secondary | ICD-10-CM

## 2023-12-14 DIAGNOSIS — I10 Essential (primary) hypertension: Secondary | ICD-10-CM | POA: Diagnosis present

## 2023-12-14 DIAGNOSIS — Z8639 Personal history of other endocrine, nutritional and metabolic disease: Secondary | ICD-10-CM

## 2023-12-14 DIAGNOSIS — Z85118 Personal history of other malignant neoplasm of bronchus and lung: Secondary | ICD-10-CM

## 2023-12-14 DIAGNOSIS — I255 Ischemic cardiomyopathy: Secondary | ICD-10-CM | POA: Diagnosis present

## 2023-12-14 DIAGNOSIS — I5041 Acute combined systolic (congestive) and diastolic (congestive) heart failure: Secondary | ICD-10-CM | POA: Diagnosis present

## 2023-12-14 DIAGNOSIS — E785 Hyperlipidemia, unspecified: Secondary | ICD-10-CM | POA: Diagnosis present

## 2023-12-14 DIAGNOSIS — J440 Chronic obstructive pulmonary disease with acute lower respiratory infection: Secondary | ICD-10-CM | POA: Diagnosis present

## 2023-12-14 DIAGNOSIS — J441 Chronic obstructive pulmonary disease with (acute) exacerbation: Secondary | ICD-10-CM | POA: Diagnosis present

## 2023-12-14 DIAGNOSIS — Z79899 Other long term (current) drug therapy: Secondary | ICD-10-CM

## 2023-12-14 LAB — I-STAT VENOUS BLOOD GAS, ED
Acid-Base Excess: 2 mmol/L (ref 0.0–2.0)
Bicarbonate: 28.7 mmol/L — ABNORMAL HIGH (ref 20.0–28.0)
Calcium, Ion: 1.01 mmol/L — ABNORMAL LOW (ref 1.15–1.40)
HCT: 48 % — ABNORMAL HIGH (ref 36.0–46.0)
Hemoglobin: 16.3 g/dL — ABNORMAL HIGH (ref 12.0–15.0)
O2 Saturation: 66 %
Potassium: 5.7 mmol/L — ABNORMAL HIGH (ref 3.5–5.1)
Sodium: 135 mmol/L (ref 135–145)
TCO2: 30 mmol/L (ref 22–32)
pCO2, Ven: 48.8 mm[Hg] (ref 44–60)
pH, Ven: 7.377 (ref 7.25–7.43)
pO2, Ven: 35 mm[Hg] (ref 32–45)

## 2023-12-14 LAB — BRAIN NATRIURETIC PEPTIDE: B Natriuretic Peptide: 2946.2 pg/mL — ABNORMAL HIGH (ref 0.0–100.0)

## 2023-12-14 LAB — CBC WITH DIFFERENTIAL/PLATELET
Abs Immature Granulocytes: 0.12 10*3/uL — ABNORMAL HIGH (ref 0.00–0.07)
Basophils Absolute: 0.1 10*3/uL (ref 0.0–0.1)
Basophils Relative: 1 %
Eosinophils Absolute: 0.1 10*3/uL (ref 0.0–0.5)
Eosinophils Relative: 1 %
HCT: 49.5 % — ABNORMAL HIGH (ref 36.0–46.0)
Hemoglobin: 15.4 g/dL — ABNORMAL HIGH (ref 12.0–15.0)
Immature Granulocytes: 1 %
Lymphocytes Relative: 12 %
Lymphs Abs: 1.6 10*3/uL (ref 0.7–4.0)
MCH: 29.4 pg (ref 26.0–34.0)
MCHC: 31.1 g/dL (ref 30.0–36.0)
MCV: 94.5 fL (ref 80.0–100.0)
Monocytes Absolute: 1.1 10*3/uL — ABNORMAL HIGH (ref 0.1–1.0)
Monocytes Relative: 9 %
Neutro Abs: 10.4 10*3/uL — ABNORMAL HIGH (ref 1.7–7.7)
Neutrophils Relative %: 76 %
Platelets: 247 10*3/uL (ref 150–400)
RBC: 5.24 MIL/uL — ABNORMAL HIGH (ref 3.87–5.11)
RDW: 19.1 % — ABNORMAL HIGH (ref 11.5–15.5)
WBC: 13.3 10*3/uL — ABNORMAL HIGH (ref 4.0–10.5)
nRBC: 0 % (ref 0.0–0.2)

## 2023-12-14 LAB — COMPREHENSIVE METABOLIC PANEL
ALT: 17 U/L (ref 0–44)
AST: 33 U/L (ref 15–41)
Albumin: 4.3 g/dL (ref 3.5–5.0)
Alkaline Phosphatase: 102 U/L (ref 38–126)
Anion gap: 20 — ABNORMAL HIGH (ref 5–15)
BUN: 20 mg/dL (ref 8–23)
CO2: 20 mmol/L — ABNORMAL LOW (ref 22–32)
Calcium: 10.1 mg/dL (ref 8.9–10.3)
Chloride: 99 mmol/L (ref 98–111)
Creatinine, Ser: 1.67 mg/dL — ABNORMAL HIGH (ref 0.44–1.00)
GFR, Estimated: 34 mL/min — ABNORMAL LOW (ref >60)
Glucose, Bld: 263 mg/dL — ABNORMAL HIGH (ref 70–99)
Potassium: 3.4 mmol/L — ABNORMAL LOW (ref 3.5–5.1)
Sodium: 139 mmol/L (ref 135–145)
Total Bilirubin: 1.3 mg/dL — ABNORMAL HIGH (ref 0.0–1.2)
Total Protein: 8.2 g/dL — ABNORMAL HIGH (ref 6.5–8.1)

## 2023-12-14 MED ORDER — ALBUTEROL SULFATE (2.5 MG/3ML) 0.083% IN NEBU
10.0000 mg/h | INHALATION_SOLUTION | Freq: Once | RESPIRATORY_TRACT | Status: AC
  Administered 2023-12-14: 10 mg/h via RESPIRATORY_TRACT
  Filled 2023-12-14: qty 24
  Filled 2023-12-14: qty 12

## 2023-12-14 MED ORDER — FUROSEMIDE 10 MG/ML IJ SOLN
120.0000 mg | Freq: Once | INTRAVENOUS | Status: AC
  Administered 2023-12-15: 120 mg via INTRAVENOUS
  Filled 2023-12-14: qty 10

## 2023-12-14 MED ORDER — ONDANSETRON HCL 4 MG/2ML IJ SOLN
INTRAMUSCULAR | Status: AC
  Filled 2023-12-14: qty 2

## 2023-12-14 NOTE — Progress Notes (Signed)
 RT x2 attemped to collect sample and was unsuccessful at this time. MD notified and RT will try again at a later time

## 2023-12-14 NOTE — Progress Notes (Signed)
 Mask removed by RN due to patient vomiting

## 2023-12-14 NOTE — ED Provider Notes (Signed)
  EMERGENCY DEPARTMENT AT [facility] Provider Note   CSN: [id number] Arrival date & time: 12/14/23  2118     History  Chief Complaint  Patient presents with   Respiratory Distress    Brittany [name]  is a 63 y.o. female.  HPI     This is a 63 year old female with history of CHF, COPD who presents with acute respiratory distress.  Patient was brought in by EMS.  She was found to be in respiratory distress.  Was thought lethargic with wheezing.  Patient was placed on CPAP.  She was 100% but was tripoding.  She received 3 DuoNebs en route, IM epinephrine , IV Solu-Medrol , nitroglycerin , and magnesium .  On my evaluation she provides minimal history.  She is in acute respiratory distress.  She is tripoding, tachypneic, speaking in short sentences.  She is able to tell me that she does not wish to be intubated.  She states that she has a living will.  Denies recent fever or cough.  Level 5 caveat  Home Medications Prior to Admission medications   Medication Sig Start Date End Date Taking? Authorizing Provider  albuterol  (VENTOLIN  HFA) 108 (90 Base) MCG/ACT inhaler Inhale 2 puffs into the lungs every 6 (six) hours as needed for wheezing or shortness of breath. 06/20/22   Fairy Frames, MD  atorvastatin  (LIPITOR ) 80 MG tablet Take 1 tablet (80 mg total) by mouth daily. 08/06/23 11/20/24  Nicholas Bar, MD  cyclobenzaprine  (FLEXERIL ) 10 MG tablet Take 1 tablet (10 mg total) by mouth 3 (three) times daily as needed for muscle spasms. 10/11/23   Tanda Bleacher, MD  digoxin  (LANOXIN ) 0.125 MG tablet TAKE 1 TABLET BY MOUTH DAILY 04/23/23   Glena Harlene HERO, FNP  ELIQUIS  2.5 MG TABS tablet TAKE 1 TABLET BY MOUTH TWICE A DAY 12/07/23   Bensimhon, Toribio SAUNDERS, MD  FARXIGA  10 MG TABS tablet TAKE 1 TABLET BY MOUTH EVERY DAY 11/21/23   Bensimhon, Toribio SAUNDERS, MD  Fluticasone -Umeclidin-Vilant (TRELEGY ELLIPTA ) 200-62.5-25 MCG/ACT AEPB Inhale 1 puff into the lungs daily. 08/16/23    Olalere, Jennet LABOR, MD  guaiFENesin -dextromethorphan  (ROBITUSSIN DM) 100-10 MG/5ML syrup Take 5 mLs by mouth every 6 (six) hours as needed for cough. 09/29/23   Arrien, Mauricio Daniel, MD  hydrOXYzine  (VISTARIL ) 25 MG capsule TAKE 1 CAPSULE (25 MG TOTAL) BY MOUTH DAILY AS NEEDED. 07/30/23   Lorren Greig PARAS, NP  ipratropium-albuterol  (DUONEB) 0.5-2.5 (3) MG/3ML SOLN Take 3 mLs by nebulization every 4 (four) hours as needed (for shortness of breath). 07/12/22   Rolan Ezra RAMAN, MD  ivabradine  (CORLANOR ) 7.5 MG TABS tablet TAKE 1 TABLET BY MOUTH TWICE A DAY WITH A MEAL 10/12/23   Bensimhon, Toribio SAUNDERS, MD  metolazone  (ZAROXOLYN ) 2.5 MG tablet Take 1 tablet (2.5 mg total) by mouth 2 (two) times a week. Take twice weekly on Tuesday and Thursday with extra 40 mEq of Potassium 11/22/23   Butner, [place], FNP  nitroGLYCERIN  (NITROSTAT ) 0.4 MG SL tablet Place 1 tablet (0.4 mg total) under the tongue every 5 (five) minutes as needed for chest pain. 02/14/19   Lesia Ozell Barter, PA-C  Nutritional Supplements (ENSURE ORIGINAL) LIQD Take 2-3 Bottles by mouth daily.    [provider]  potassium chloride  (KLOR-CON ) 10 MEQ tablet Take 8 tablets (80 mEq total) by mouth 2 (two) times daily. Take extra 4 tablets (40 mEq total) with weekly Metolazone  dose 11/26/23   [name], Harlene HERO, FNP  spironolactone  (ALDACTONE ) 25 MG tablet Take 0.5 tablets (12.5  mg total) by mouth at bedtime. 09/29/23 12/28/23  Arrien, Mauricio Daniel, MD  torsemide  (DEMADEX ) 20 MG tablet Take 4 tablets (80 mg total) by mouth 2 (two) times daily. 10/09/23 01/07/24  Glena Harlene HERO, FNP      Allergies    Lactose intolerance (gi) and Codeine    Review of Systems   Review of Systems  Unable to perform ROS: Acuity of condition    Physical Exam Updated Vital Signs BP 102/60   Pulse (!) 121   Temp (!) 97 F (36.1 C) (Axillary)   Resp (!) 30   Ht 1.499 m (4' 11)   Wt 43.6 kg   SpO2 100%   BMI 19.41 kg/m  Physical  Exam Vitals and nursing note reviewed.  Constitutional:      General: She is in acute distress.     Appearance: She is well-developed. She is ill-appearing and toxic-appearing.  HENT:     Head: Normocephalic and atraumatic.  Eyes:     Pupils: Pupils are equal, round, and reactive to light.  Cardiovascular:     Rate and Rhythm: Regular rhythm. Tachycardia present.     Heart sounds: Normal heart sounds.  Pulmonary:     Effort: Respiratory distress present.     Breath sounds: Wheezing present.     Comments: Speaking in short sentences, tripoding, diminished breath sounds in all lung fields, tight right greater than left with faint expiratory wheeze Abdominal:     Palpations: Abdomen is soft.  Musculoskeletal:     Cervical back: Neck supple.     Right lower leg: No edema.     Left lower leg: No edema.  Skin:    General: Skin is warm and dry.  Neurological:     Mental Status: She is alert and oriented to person, place, and time.  Psychiatric:        Mood and Affect: Mood normal.     ED Results / Procedures / Treatments   Labs (all labs ordered are listed, but only abnormal results are displayed) Labs Reviewed  CBC WITH DIFFERENTIAL/PLATELET - Abnormal; Notable for the following components:      Result Value   WBC 13.3 (*)    RBC 5.24 (*)    Hemoglobin 15.4 (*)    HCT 49.5 (*)    RDW 19.1 (*)    Neutro Abs 10.4 (*)    Monocytes Absolute 1.1 (*)    Abs Immature Granulocytes 0.12 (*)    All other components within normal limits  COMPREHENSIVE METABOLIC PANEL - Abnormal; Notable for the following components:   Potassium 3.4 (*)    CO2 20 (*)    Glucose, Bld 263 (*)    Creatinine, Ser 1.67 (*)    Total Protein 8.2 (*)    Total Bilirubin 1.3 (*)    GFR, Estimated 34 (*)    Anion gap 20 (*)    All other components within normal limits  BRAIN NATRIURETIC PEPTIDE - Abnormal; Notable for the following components:   B Natriuretic Peptide 2,946.2 (*)    All other components  within normal limits  I-STAT VENOUS BLOOD GAS, ED - Abnormal; Notable for the following components:   Bicarbonate 28.7 (*)    Potassium 5.7 (*)    Calcium , Ion 1.01 (*)    HCT 48.0 (*)    Hemoglobin 16.3 (*)    All other components within normal limits  RESP PANEL BY RT-PCR (RSV, FLU A&B, COVID)  RVPGX2  I-STAT ARTERIAL BLOOD GAS,  ED    EKG None  Radiology DG Chest Portable 1 View Result Date: 12/14/2023 CLINICAL DATA:  09/25/2023 EXAM: PORTABLE CHEST 1 VIEW COMPARISON:  09/25/2023 FINDINGS: Cardiac shadow is stable. Defibrillator and postsurgical changes are again noted. Stimulator pack is noted in the right chest. Lungs are hyperinflated but clear. No bony abnormality is seen. IMPRESSION: No acute abnormality noted. Electronically Signed   By: Oneil Devonshire M.D.   On: 12/14/2023 22:56    Procedures .Critical Care  Performed by: Bari Charmaine FALCON, MD Authorized by: Bari Charmaine FALCON, MD   Critical care provider statement:    Critical care time (minutes):  45   Critical care was necessary to treat or prevent imminent or life-threatening deterioration of the following conditions:  Respiratory failure   Critical care was time spent personally by me on the following activities:  Development of treatment plan with patient or surrogate, discussions with consultants, evaluation of patient's response to treatment, examination of patient, ordering and review of laboratory studies, ordering and review of radiographic studies, ordering and performing treatments and interventions, pulse oximetry, re-evaluation of patient's condition and review of old charts     Medications Ordered in ED Medications  furosemide  (LASIX ) 120 mg in dextrose  5 % 50 mL IVPB (has no administration in time range)  albuterol  (PROVENTIL ) (2.5 MG/3ML) 0.083% nebulizer solution (10 mg/hr Nebulization Given 12/14/23 2206)  ondansetron  (ZOFRAN ) 4 MG/2ML injection (  Given 12/14/23 2150)    ED Course/ Medical Decision  Making/ A&P Clinical Course as of 12/14/23 2332  Fri Dec 14, 2023  2331 Patient signed out at bedside with Dr. Trine.  On recheck she is clinically improved and is on nasal cannula.  She has coarse breath sounds with persistent wheezing but overall improved aeration. [CH]    Clinical Course User Index [CH] Dorion Petillo, Charmaine FALCON, MD                                 Medical Decision Making Amount and/or Complexity of Data Reviewed Labs: ordered. Radiology: ordered.  Risk Prescription drug management.   This patient presents to the ED for concern of respiratory distress, this involves an extensive number of treatment options, and is a complaint that carries with it a high risk of complications and morbidity.  I considered the following differential and admission for this acute, potentially life threatening condition.  The differential diagnosis includes COPD, CHF, pneumonia, pneumothorax  MDM:    This is a 63 year old female who presents in acute respiratory distress.  Initially tripoding and quite ill-appearing.  She confirms that she is DNR and has a living will.  She initially vomited.  Not appropriate for BiPAP.  However she clinically looks much better after being transition to nasal cannula.  She was given a continuous neb.  She does not overtly appear volume overloaded.  Labs notable for leukocytosis to 13.3.  BNP is acutely elevated.  She is on a significant amount of torsemide  at home.  Chest x-ray does not show any evidence of overt pulmonary edema or pneumonia.  Respiratory failure is likely secondary to multifactorial process including COPD and CHF.  I did give her dose of Lasix .  Patient signed out pending reassessment.  (Labs, imaging, consults)  Labs: I Ordered, and personally interpreted labs.  The pertinent results include: CBC, CMP, BNP  Imaging Studies ordered: I ordered imaging studies including chest x-ray I independently visualized and interpreted imaging. I  agree  with the radiologist interpretation  Additional history obtained from EMS.  External records from outside source obtained and reviewed including prior evaluations  Cardiac Monitoring: The patient was maintained on a cardiac monitor.  If on the cardiac monitor, I personally viewed and interpreted the cardiac monitored which showed an underlying rhythm of: Sinus tachycardia  Reevaluation: After the interventions noted above, I reevaluated the patient and found that they have :improved  Social Determinants of Health:  lives independently  Disposition: Anticipate admission  Co morbidities that complicate the patient evaluation  Past Medical History:  Diagnosis Date   Acute respiratory failure (HCC) 05/05/2018   Acute systolic congestive heart failure (HCC) 02/03/2018   AICD (automatic cardioverter/defibrillator) present    Allergy    Anxiety    Asthma    DM2 (diabetes mellitus, type 2) (HCC) 10/20/2020   Hypertension    PAF (paroxysmal atrial fibrillation) (HCC)    Presence of permanent cardiac pacemaker    Prophylactic measure 08/03/14-08/19/14   Prophyl. 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   LIMA to LAD, SVG to D1, SVG to OM1, EVH via right thigh with implantation of Impella LD LVAD via direct aortic approach   Small cell lung cancer (HCC) 03/16/2014     Medicines Meds ordered this encounter  Medications   albuterol  (PROVENTIL ) (2.5 MG/3ML) 0.083% nebulizer solution   ondansetron  (ZOFRAN ) 4 MG/2ML injection    [place], Caitlin : cabinet override   furosemide  (LASIX ) 120 mg in dextrose  5 % 50 mL IVPB    I have reviewed the patients home medicines and have made adjustments as needed  Problem List / ED Course: Problem List Items Addressed This Visit   None Visit Diagnoses       Hypoxia    -  Primary                   Final Clinical Impression(s) / ED Diagnoses Final diagnoses:  Hypoxia    Rx / DC Orders ED Discharge Orders      None         Bari Charmaine FALCON, MD 12/14/23 2335

## 2023-12-14 NOTE — ED Notes (Addendum)
 Family called per pt request at this time. Voicemail left for sister Rinaldo Cloud. Harlene Salts on emergency contact list notified that pt is here and was requesting he be notified.

## 2023-12-14 NOTE — ED Triage Notes (Signed)
 BIB [facility] from a motel, unknown home address for respiratory distress, per ems patient was lethargic, heard rales, rhonchi and wheezes, history of copd and chf. Patient was brought in on CPAP  En route patient given duoneb x3, IM epinephrine  0.3mg , 125mg  IV solumedrol, 0.4mg  of nitroglycerin  sublingual and 2g of mag sulfate.

## 2023-12-15 ENCOUNTER — Other Ambulatory Visit: Payer: Self-pay

## 2023-12-15 ENCOUNTER — Encounter (HOSPITAL_COMMUNITY): Payer: Self-pay | Admitting: Internal Medicine

## 2023-12-15 ENCOUNTER — Inpatient Hospital Stay (HOSPITAL_COMMUNITY): Payer: BC Managed Care – PPO

## 2023-12-15 DIAGNOSIS — B974 Respiratory syncytial virus as the cause of diseases classified elsewhere: Secondary | ICD-10-CM | POA: Diagnosis present

## 2023-12-15 DIAGNOSIS — Z515 Encounter for palliative care: Secondary | ICD-10-CM | POA: Diagnosis not present

## 2023-12-15 DIAGNOSIS — J441 Chronic obstructive pulmonary disease with (acute) exacerbation: Secondary | ICD-10-CM

## 2023-12-15 DIAGNOSIS — N1832 Chronic kidney disease, stage 3b: Secondary | ICD-10-CM | POA: Diagnosis present

## 2023-12-15 DIAGNOSIS — J9622 Acute and chronic respiratory failure with hypercapnia: Secondary | ICD-10-CM | POA: Diagnosis present

## 2023-12-15 DIAGNOSIS — Z66 Do not resuscitate: Secondary | ICD-10-CM | POA: Diagnosis present

## 2023-12-15 DIAGNOSIS — I10 Essential (primary) hypertension: Secondary | ICD-10-CM | POA: Diagnosis not present

## 2023-12-15 DIAGNOSIS — R570 Cardiogenic shock: Secondary | ICD-10-CM | POA: Diagnosis present

## 2023-12-15 DIAGNOSIS — N179 Acute kidney failure, unspecified: Secondary | ICD-10-CM

## 2023-12-15 DIAGNOSIS — J9601 Acute respiratory failure with hypoxia: Secondary | ICD-10-CM | POA: Diagnosis not present

## 2023-12-15 DIAGNOSIS — I5041 Acute combined systolic (congestive) and diastolic (congestive) heart failure: Secondary | ICD-10-CM

## 2023-12-15 DIAGNOSIS — J129 Viral pneumonia, unspecified: Secondary | ICD-10-CM | POA: Diagnosis not present

## 2023-12-15 DIAGNOSIS — E1122 Type 2 diabetes mellitus with diabetic chronic kidney disease: Secondary | ICD-10-CM | POA: Diagnosis present

## 2023-12-15 DIAGNOSIS — E785 Hyperlipidemia, unspecified: Secondary | ICD-10-CM | POA: Diagnosis present

## 2023-12-15 DIAGNOSIS — J440 Chronic obstructive pulmonary disease with acute lower respiratory infection: Secondary | ICD-10-CM | POA: Diagnosis present

## 2023-12-15 DIAGNOSIS — I480 Paroxysmal atrial fibrillation: Secondary | ICD-10-CM | POA: Diagnosis present

## 2023-12-15 DIAGNOSIS — Z8679 Personal history of other diseases of the circulatory system: Secondary | ICD-10-CM

## 2023-12-15 DIAGNOSIS — Z7189 Other specified counseling: Secondary | ICD-10-CM | POA: Diagnosis not present

## 2023-12-15 DIAGNOSIS — Z681 Body mass index (BMI) 19 or less, adult: Secondary | ICD-10-CM | POA: Diagnosis not present

## 2023-12-15 DIAGNOSIS — I5022 Chronic systolic (congestive) heart failure: Secondary | ICD-10-CM | POA: Diagnosis not present

## 2023-12-15 DIAGNOSIS — I25810 Atherosclerosis of coronary artery bypass graft(s) without angina pectoris: Secondary | ICD-10-CM | POA: Diagnosis present

## 2023-12-15 DIAGNOSIS — I255 Ischemic cardiomyopathy: Secondary | ICD-10-CM | POA: Diagnosis present

## 2023-12-15 DIAGNOSIS — I5043 Acute on chronic combined systolic (congestive) and diastolic (congestive) heart failure: Secondary | ICD-10-CM | POA: Diagnosis present

## 2023-12-15 DIAGNOSIS — N189 Chronic kidney disease, unspecified: Secondary | ICD-10-CM | POA: Diagnosis not present

## 2023-12-15 DIAGNOSIS — Z8639 Personal history of other endocrine, nutritional and metabolic disease: Secondary | ICD-10-CM

## 2023-12-15 DIAGNOSIS — I130 Hypertensive heart and chronic kidney disease with heart failure and stage 1 through stage 4 chronic kidney disease, or unspecified chronic kidney disease: Secondary | ICD-10-CM | POA: Diagnosis present

## 2023-12-15 DIAGNOSIS — E43 Unspecified severe protein-calorie malnutrition: Secondary | ICD-10-CM | POA: Diagnosis present

## 2023-12-15 DIAGNOSIS — R0603 Acute respiratory distress: Secondary | ICD-10-CM | POA: Diagnosis present

## 2023-12-15 DIAGNOSIS — N170 Acute kidney failure with tubular necrosis: Secondary | ICD-10-CM | POA: Diagnosis not present

## 2023-12-15 DIAGNOSIS — E876 Hypokalemia: Secondary | ICD-10-CM | POA: Diagnosis present

## 2023-12-15 DIAGNOSIS — I2781 Cor pulmonale (chronic): Secondary | ICD-10-CM | POA: Diagnosis present

## 2023-12-15 DIAGNOSIS — J189 Pneumonia, unspecified organism: Secondary | ICD-10-CM | POA: Diagnosis present

## 2023-12-15 DIAGNOSIS — Z1152 Encounter for screening for COVID-19: Secondary | ICD-10-CM | POA: Diagnosis not present

## 2023-12-15 DIAGNOSIS — Z95810 Presence of automatic (implantable) cardiac defibrillator: Secondary | ICD-10-CM | POA: Diagnosis not present

## 2023-12-15 DIAGNOSIS — J9621 Acute and chronic respiratory failure with hypoxia: Secondary | ICD-10-CM | POA: Diagnosis present

## 2023-12-15 DIAGNOSIS — N280 Ischemia and infarction of kidney: Secondary | ICD-10-CM | POA: Diagnosis present

## 2023-12-15 LAB — RESP PANEL BY RT-PCR (RSV, FLU A&B, COVID)  RVPGX2
Influenza A by PCR: NEGATIVE
Influenza B by PCR: NEGATIVE
Resp Syncytial Virus by PCR: NEGATIVE
SARS Coronavirus 2 by RT PCR: NEGATIVE

## 2023-12-15 LAB — COMPREHENSIVE METABOLIC PANEL
ALT: 18 U/L (ref 0–44)
AST: 30 U/L (ref 15–41)
Albumin: 3.9 g/dL (ref 3.5–5.0)
Alkaline Phosphatase: 93 U/L (ref 38–126)
Anion gap: 16 — ABNORMAL HIGH (ref 5–15)
BUN: 23 mg/dL (ref 8–23)
CO2: 23 mmol/L (ref 22–32)
Calcium: 9.5 mg/dL (ref 8.9–10.3)
Chloride: 97 mmol/L — ABNORMAL LOW (ref 98–111)
Creatinine, Ser: 1.74 mg/dL — ABNORMAL HIGH (ref 0.44–1.00)
GFR, Estimated: 33 mL/min — ABNORMAL LOW (ref >60)
Glucose, Bld: 237 mg/dL — ABNORMAL HIGH (ref 70–99)
Potassium: 3 mmol/L — ABNORMAL LOW (ref 3.5–5.1)
Sodium: 136 mmol/L (ref 135–145)
Total Bilirubin: 1 mg/dL (ref 0.0–1.2)
Total Protein: 7.7 g/dL (ref 6.5–8.1)

## 2023-12-15 LAB — RESPIRATORY PANEL BY PCR
Adenovirus: NOT DETECTED
Bordetella Parapertussis: NOT DETECTED
Bordetella pertussis: NOT DETECTED
Chlamydophila pneumoniae: NOT DETECTED
Coronavirus 229E: NOT DETECTED
Coronavirus HKU1: NOT DETECTED
Coronavirus NL63: NOT DETECTED
Coronavirus OC43: DETECTED — AB
Influenza A: NOT DETECTED
Influenza B: NOT DETECTED
Metapneumovirus: NOT DETECTED
Mycoplasma pneumoniae: NOT DETECTED
Parainfluenza Virus 1: NOT DETECTED
Parainfluenza Virus 2: NOT DETECTED
Parainfluenza Virus 3: NOT DETECTED
Parainfluenza Virus 4: NOT DETECTED
Respiratory Syncytial Virus: DETECTED — AB
Rhinovirus / Enterovirus: NOT DETECTED

## 2023-12-15 LAB — ECHOCARDIOGRAM LIMITED
AV Peak grad: 16.3 mm[Hg]
Ao pk vel: 2.02 m/s
Area-P 1/2: 4.54 cm2
Est EF: <20
Height: 59 in
MV M vel: 3.77 m/s
MV Peak grad: 56.9 mm[Hg]
P 1/2 time: 265 ms
S' Lateral: 5.4 cm
Weight: 1537.93 [oz_av]

## 2023-12-15 LAB — CBC
HCT: 43.9 % (ref 36.0–46.0)
Hemoglobin: 14.1 g/dL (ref 12.0–15.0)
MCH: 29.4 pg (ref 26.0–34.0)
MCHC: 32.1 g/dL (ref 30.0–36.0)
MCV: 91.6 fL (ref 80.0–100.0)
Platelets: 161 10*3/uL (ref 150–400)
RBC: 4.79 MIL/uL (ref 3.87–5.11)
RDW: 18.6 % — ABNORMAL HIGH (ref 11.5–15.5)
WBC: 15.1 10*3/uL — ABNORMAL HIGH (ref 4.0–10.5)
nRBC: 0 % (ref 0.0–0.2)

## 2023-12-15 LAB — PROCALCITONIN: Procalcitonin: <0.1 ng/mL

## 2023-12-15 MED ORDER — MIDODRINE HCL 5 MG PO TABS: 5.0000 mg | ORAL_TABLET | Freq: Three times a day (TID) | ORAL | Status: DC

## 2023-12-15 MED ORDER — DOXYCYCLINE HYCLATE 100 MG IV SOLR
100.0000 mg | Freq: Two times a day (BID) | INTRAVENOUS | Status: DC
  Administered 2023-12-15: 100 mg via INTRAVENOUS
  Filled 2023-12-15: qty 100

## 2023-12-15 MED ORDER — SODIUM CHLORIDE 0.9% FLUSH
3.0000 mL | INTRAVENOUS | Status: DC | PRN
Start: 2023-12-15 — End: 2023-12-15

## 2023-12-15 MED ORDER — ACETAMINOPHEN 650 MG RE SUPP: 650.0000 mg | Freq: Four times a day (QID) | RECTAL | Status: DC | PRN

## 2023-12-15 MED ORDER — ALBUMIN HUMAN 25 % IV SOLN: 50.0000 g | INTRAVENOUS | Status: DC

## 2023-12-15 MED ORDER — ACETYLCYSTEINE 20 % IN SOLN
3.0000 mL | Freq: Three times a day (TID) | RESPIRATORY_TRACT | Status: DC
  Administered 2023-12-15 – 2023-12-16 (×5): 3 mL via RESPIRATORY_TRACT
  Filled 2023-12-15 (×7): qty 4

## 2023-12-15 MED ORDER — SODIUM CHLORIDE 0.9 % IV SOLN: 250.0000 mL | INTRAVENOUS | Status: DC | PRN

## 2023-12-15 MED ORDER — FUROSEMIDE 10 MG/ML IJ SOLN: 20.0000 mg | Freq: Two times a day (BID) | INTRAMUSCULAR | Status: DC

## 2023-12-15 MED ORDER — FUROSEMIDE 10 MG/ML IJ SOLN
40.0000 mg | Freq: Two times a day (BID) | INTRAMUSCULAR | Status: DC
  Administered 2023-12-15 (×2): 40 mg via INTRAVENOUS
  Filled 2023-12-15 (×2): qty 4

## 2023-12-15 MED ORDER — SODIUM CHLORIDE 0.9 % IV SOLN: 2.0000 g | INTRAVENOUS | Status: DC

## 2023-12-15 MED ORDER — LEVALBUTEROL HCL 0.63 MG/3ML IN NEBU
0.6300 mg | INHALATION_SOLUTION | RESPIRATORY_TRACT | Status: DC | PRN
  Administered 2023-12-20: 0.63 mg via RESPIRATORY_TRACT
  Filled 2023-12-15: qty 3

## 2023-12-15 MED ORDER — SENNOSIDES-DOCUSATE SODIUM 8.6-50 MG PO TABS: 1.0000 | ORAL_TABLET | Freq: Every evening | ORAL | Status: DC | PRN

## 2023-12-15 MED ORDER — MIDODRINE HCL 5 MG PO TABS
5.0000 mg | ORAL_TABLET | Freq: Two times a day (BID) | ORAL | Status: DC
  Administered 2023-12-15: 5 mg via ORAL
  Filled 2023-12-15: qty 1

## 2023-12-15 MED ORDER — GUAIFENESIN-DM 100-10 MG/5ML PO SYRP: 5.0000 mL | ORAL_SOLUTION | Freq: Four times a day (QID) | ORAL | Status: DC | PRN

## 2023-12-15 MED ORDER — IPRATROPIUM-ALBUTEROL 0.5-2.5 (3) MG/3ML IN SOLN
3.0000 mL | Freq: Four times a day (QID) | RESPIRATORY_TRACT | Status: DC
  Administered 2023-12-15 – 2023-12-19 (×19): 3 mL via RESPIRATORY_TRACT
  Filled 2023-12-15 (×18): qty 3

## 2023-12-15 MED ORDER — SODIUM CHLORIDE 0.9 % IV SOLN: 500.0000 mg | INTRAVENOUS | Status: DC

## 2023-12-15 MED ORDER — POTASSIUM CHLORIDE 20 MEQ PO PACK
40.0000 meq | PACK | Freq: Once | ORAL | Status: AC
  Administered 2023-12-15: 40 meq via ORAL
  Filled 2023-12-15: qty 2

## 2023-12-15 MED ORDER — METHYLPREDNISOLONE SODIUM SUCC 125 MG IJ SOLR
60.0000 mg | Freq: Two times a day (BID) | INTRAMUSCULAR | Status: DC
  Administered 2023-12-15 – 2023-12-16 (×2): 60 mg via INTRAVENOUS
  Filled 2023-12-15 (×2): qty 2

## 2023-12-15 MED ORDER — DIGOXIN 125 MCG PO TABS
125.0000 ug | ORAL_TABLET | Freq: Every day | ORAL | Status: DC
  Administered 2023-12-15 – 2023-12-20 (×5): 125 ug via ORAL
  Filled 2023-12-15 (×6): qty 1

## 2023-12-15 MED ORDER — IVABRADINE HCL 5 MG PO TABS
7.5000 mg | ORAL_TABLET | Freq: Two times a day (BID) | ORAL | Status: DC
  Administered 2023-12-15 – 2023-12-18 (×7): 7.5 mg via ORAL
  Filled 2023-12-15 (×8): qty 1

## 2023-12-15 MED ORDER — POTASSIUM CHLORIDE 20 MEQ PO PACK
40.0000 meq | PACK | Freq: Every day | ORAL | Status: DC
  Administered 2023-12-15 – 2023-12-19 (×5): 40 meq via ORAL
  Filled 2023-12-15 (×5): qty 2

## 2023-12-15 MED ORDER — GUAIFENESIN-DM 100-10 MG/5ML PO SYRP
5.0000 mL | ORAL_SOLUTION | Freq: Four times a day (QID) | ORAL | Status: DC
  Administered 2023-12-15 – 2023-12-22 (×27): 5 mL via ORAL
  Filled 2023-12-15 (×30): qty 5

## 2023-12-15 MED ORDER — MIDODRINE HCL 5 MG PO TABS: 5.0000 mg | ORAL_TABLET | Freq: Two times a day (BID) | ORAL | Status: DC

## 2023-12-15 MED ORDER — ACETAMINOPHEN 325 MG PO TABS: 650.0000 mg | ORAL_TABLET | Freq: Four times a day (QID) | ORAL | Status: DC | PRN

## 2023-12-15 MED ORDER — SODIUM CHLORIDE 0.9% FLUSH
3.0000 mL | Freq: Two times a day (BID) | INTRAVENOUS | Status: DC
  Administered 2023-12-15: 3 mL via INTRAVENOUS

## 2023-12-15 MED ORDER — APIXABAN 2.5 MG PO TABS
2.5000 mg | ORAL_TABLET | Freq: Two times a day (BID) | ORAL | Status: DC
  Administered 2023-12-15 – 2023-12-22 (×16): 2.5 mg via ORAL
  Filled 2023-12-15 (×16): qty 1

## 2023-12-15 MED ORDER — PERFLUTREN LIPID MICROSPHERE
1.0000 mL | INTRAVENOUS | Status: AC | PRN
  Administered 2023-12-15: 3 mL via INTRAVENOUS

## 2023-12-15 MED ORDER — ENSURE ENLIVE PO LIQD
237.0000 mL | Freq: Every day | ORAL | Status: DC
  Administered 2023-12-16 – 2023-12-17 (×2): 237 mL via ORAL
  Filled 2023-12-15: qty 237

## 2023-12-15 MED ORDER — SODIUM CHLORIDE 0.9 % IV SOLN
2.0000 g | Freq: Once | INTRAVENOUS | Status: AC
  Administered 2023-12-15: 2 g via INTRAVENOUS
  Filled 2023-12-15: qty 20

## 2023-12-15 MED ORDER — MIDODRINE HCL 5 MG PO TABS: 5.0000 mg | ORAL_TABLET | ORAL | Status: DC

## 2023-12-15 MED ORDER — HYDROXYZINE HCL 10 MG PO TABS
10.0000 mg | ORAL_TABLET | Freq: Once | ORAL | Status: AC
  Administered 2023-12-15: 10 mg via ORAL
  Filled 2023-12-15: qty 1

## 2023-12-15 MED ORDER — PANTOPRAZOLE SODIUM 40 MG PO TBEC
40.0000 mg | DELAYED_RELEASE_TABLET | Freq: Every day | ORAL | Status: DC
  Administered 2023-12-15 – 2023-12-22 (×8): 40 mg via ORAL
  Filled 2023-12-15 (×9): qty 1

## 2023-12-15 MED ORDER — MIDODRINE HCL 5 MG PO TABS
5.0000 mg | ORAL_TABLET | Freq: Three times a day (TID) | ORAL | Status: DC
  Administered 2023-12-15 (×3): 5 mg via ORAL
  Filled 2023-12-15 (×3): qty 1

## 2023-12-15 MED ORDER — ATORVASTATIN CALCIUM 80 MG PO TABS
80.0000 mg | ORAL_TABLET | Freq: Every day | ORAL | Status: DC
  Administered 2023-12-15 – 2023-12-22 (×8): 80 mg via ORAL
  Filled 2023-12-15 (×5): qty 1
  Filled 2023-12-15: qty 2
  Filled 2023-12-15 (×2): qty 1

## 2023-12-15 MED ORDER — CYCLOBENZAPRINE HCL 5 MG PO TABS: 10.0000 mg | ORAL_TABLET | Freq: Three times a day (TID) | ORAL | Status: DC | PRN

## 2023-12-15 MED ORDER — METHYLPREDNISOLONE SODIUM SUCC 125 MG IJ SOLR
80.0000 mg | Freq: Two times a day (BID) | INTRAMUSCULAR | Status: DC
  Administered 2023-12-15 (×2): 80 mg via INTRAVENOUS
  Filled 2023-12-15 (×2): qty 2

## 2023-12-15 NOTE — Consult Note (Addendum)
 Palliative Care Consult Note                                  Date: 12/15/2023   Patient Name: Brittany [name]   DOB: 09-24-1961  MRN: [id number]  Age / Sex: 63 y.o., female  PCP: Tanda Bleacher, MD Referring Physician: Fairy Frames, MD  Reason for Consultation: Establishing goals of care  HPI/Patient Profile: 63 y.o. female  with past medical history of chronic HFrEF (EF 25%), AICD, COPD with chronic respiratory failure on 2 L home O2, paroxysmal atrial fibrillation, and history of small cell lung cancer s/p chemo and XRT in 2015. She presented to the ED on 12/14/2023 with respiratory distress, and is admitted with acute on chronic CHF, COPD exacerbation, and AKI.  Palliative Medicine was consulted for goals of care discussions.     Subjective:   I have reviewed medical records including EPIC notes, labs and imaging.   I met with patient at bedside in the ED to discuss diagnosis, prognosis, and GOC.  I introduced Palliative Medicine as specialized medical care for people living with serious illness. It focuses on providing relief from the symptoms and stress of a serious illness.   Created space and opportunity for patient to explore thoughts and feelings regarding current medical situation.Values and goals of care were attempted to be elicited.    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  She has 1 son and 4 grandchildren.   Functional Status: Brittany [name] reports she has been living in an teaching laboratory technician. She prefers this setting because it is easier to get around in a smaller area and there are no steps. She reports having a lot a support from friends and family. She also has a CMA (caregiver) 3 days a week - to help with groceries, medications, and taking her to medical appointments.   Today's Discussion: We  discussed patient's current illness and what it means in the larger context of her ongoing co-morbidities. Current clinical status was reviewed. Natural disease trajectory of advanced heart failure and COPD was discussed. Brittany [name] understands she has progressive and non-curable illness. She expresses appreciation for the excellent care provided by the heart failure team.   Brittany [name] shares that she has been through so much and that she is tired. She confirms her desire for a natural and peaceful death when that time comes. She reports having a very strong faith, and states she is not afraid of death.   I mentioned hospice; Brittany [name] is clear she is not interested in home hospice services at this time.   For now, Brittany [name] wants to continue to supportive interventions with medical management and diuresis. She understands this is limited by her low blood pressure and kidney function. Her goal is medical stabilization and to return home.   Reviewed Brittany [name]'s AD documents on file in [facility]. Confirmed that the primary health care agent is Brittany [name] (her sister) and the alternate is Brittany [name] (her aunt).   Discussed the importance of continued conversation with her medical providers regarding overall plan of care.    Review of Systems  Constitutional:  Positive for fatigue.  Respiratory:  Positive for shortness of breath.     Objective:   Primary Diagnoses: Present on Admission: . Acute combined systolic and diastolic CHF, NYHA class 4 (HCC) . Acute hypoxic respiratory failure (  HCC) . COPD with acute exacerbation (HCC) . Acute kidney injury superimposed on chronic kidney disease (HCC) . Essential hypertension . Respiratory distress   Physical Exam Vitals reviewed.  Constitutional:      General: She is not in acute distress.    Comments: Chronically ill-appearing  Pulmonary:     Effort: No respiratory distress.  Neurological:     Mental Status: She is alert and oriented to person,  place, and time.  Psychiatric:     Comments: pleasant     Vital Signs:  BP 114/64   Pulse 73   Temp 98 F (36.7 C) (Axillary)   Resp 18   Ht 4' 11 (1.499 m)   Wt 43.6 kg   SpO2 100%   BMI 19.41 kg/m   Palliative Assessment/Data: PPS 50%     Assessment & Plan:   SUMMARY OF RECOMMENDATIONS   Confirmed DNR/DNI status Continue current supportive interventions Goal of care is medical stabilization and to return home PMT will continue to follow  Primary Decision Maker: PATIENT  Advanced Directives: HCPOA and living will documents are in file in [facility]  Symptom Management:  Per attending  Prognosis:  Unable to determine  Discharge Planning:  To Be Determined     Thank you for allowing us  to participate in the care of Brittany [name]    Time Total: 75 minutes  Detailed review of medical records (labs, imaging, vital signs), medically appropriate exam, discussed with treatment team, counseling and education to patient, family, & staff, documenting clinical information, medication management, coordination of care.   Signed by: Recardo Loll, NP Palliative Medicine Team  Team Phone # [phone]  For individual providers, please see AMION

## 2023-12-15 NOTE — Progress Notes (Signed)
 Echocardiogram 2D Echocardiogram has been performed.  Brittany [name] 12/15/2023, 2:59 PM

## 2023-12-15 NOTE — ED Notes (Signed)
 ED TO INPATIENT HANDOFF REPORT  ED Nurse Name and Phone #:   S Name/Age/Gender Brittany [name]  63 y.o. female Room/Bed: 006C/006C  Code Status   Code Status: Limited: Do not attempt resuscitation (DNR) -DNR-LIMITED -Do Not Intubate/DNI   Home/SNF/Other Home Patient oriented to: self, place, time, and situation Is this baseline? Yes   Triage Complete: Triage complete  Chief Complaint Respiratory distress [R06.03]  Triage Note BIB [facility] from a motel, unknown home address for respiratory distress, per ems patient was lethargic, heard rales, rhonchi and wheezes, history of copd and chf. Patient was brought in on CPAP  En route patient given duoneb x3, IM epinephrine  0.3mg , 125mg  IV solumedrol, 0.4mg  of nitroglycerin  sublingual and 2g of mag sulfate.    Allergies Allergies  Allergen Reactions   Lactose Intolerance (Gi)     Per patient   Codeine Nausea And Vomiting    Level of Care/Admitting Diagnosis ED Disposition     ED Disposition  Admit   Condition  --   Comment  Hospital [facility] [[id number]]  Level of Care: Progressive [102]  Admit to Progressive based on following criteria: Other see comments  Comments: Respiratory distress  May admit patient to [facility] or [facility] if equivalent level of care is available:: No  Covid Evaluation: Confirmed COVID Negative  Diagnosis: Respiratory distress [758123]  Admitting Physician: SUNDIL, SUBRINA [[id number]]  Attending Physician: SUNDIL, SUBRINA [[id number]]  Certification:: I certify this patient will need inpatient services for at least 2 midnights  Expected Medical Readiness: 12/20/2023          B Medical/Surgery History Past Medical History:  Diagnosis Date   Acute respiratory failure (HCC) 05/05/2018   Acute systolic congestive heart failure (HCC) 02/03/2018   AICD (automatic cardioverter/defibrillator) present    Allergy    Anxiety    Asthma    DM2 (diabetes mellitus,  type 2) (HCC) 10/20/2020   Hypertension    PAF (paroxysmal atrial fibrillation) (HCC)    Presence of permanent cardiac pacemaker    Prophylactic measure 08/03/14-08/19/14   Prophyl. 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   LIMA to LAD, SVG to D1, SVG to OM1, EVH via right thigh with implantation of Impella LD LVAD via direct aortic approach   Small cell lung cancer (HCC) 03/16/2014   Past Surgical History:  Procedure Laterality Date   BRONCHIAL BRUSHINGS  10/25/2020   Procedure: BRONCHIAL BRUSHINGS;  Surgeon: Shelah Lamar RAMAN, MD;  Location: [facility] ENDOSCOPY;  Service: Pulmonary;;   BRONCHIAL NEEDLE ASPIRATION BIOPSY  10/25/2020   Procedure: BRONCHIAL NEEDLE ASPIRATION BIOPSIES;  Surgeon: Shelah Lamar RAMAN, MD;  Location: [facility] ENDOSCOPY;  Service: Pulmonary;;   CARDIAC DEFIBRILLATOR PLACEMENT  08/15/2018   MDT Visia AF MRI VR ICD implanted by Dr Zulma for primary prevention of sudden   CESAREAN SECTION     CORONARY ARTERY BYPASS GRAFT N/A 02/03/2018   Procedure: CORONARY ARTERY BYPASS GRAFTING (CABG);  Surgeon: Dusty Sudie DEL, MD;  Location: [facility] OR;  Service: Open Heart Surgery;  Laterality: N/A;  Time 3 using left internal mammary artery and endoscopically harvested right saphenous vein   CORONARY BALLOON ANGIOPLASTY N/A 02/03/2018   Procedure: CORONARY BALLOON ANGIOPLASTY;  Surgeon: Jordan, Peter M, MD;  Location: [facility] INVASIVE CV LAB;  Service: Cardiovascular;  Laterality: N/A;   CORONARY STENT INTERVENTION N/A 02/12/2019   Procedure: CORONARY STENT INTERVENTION;  Surgeon: Darron Deatrice LABOR, MD;  Location: [facility] INVASIVE CV LAB;  Service: Cardiovascular;  Laterality: N/A;   CORONARY/GRAFT ACUTE MI REVASCULARIZATION N/A 02/03/2018   Procedure: Coronary/Graft Acute MI Revascularization;  Surgeon: Jordan, Peter M, MD;  Location: [facility] INVASIVE CV LAB;  Service: Cardiovascular;  Laterality: N/A;   ENDOBRONCHIAL ULTRASOUND N/A 10/25/2020   Procedure: ENDOBRONCHIAL ULTRASOUND;  Surgeon:  Shelah Lamar RAMAN, MD;  Location: [facility] ENDOSCOPY;  Service: Pulmonary;  Laterality: N/A;   FLEXIBLE BRONCHOSCOPY  10/25/2020   Procedure: FLEXIBLE BRONCHOSCOPY;  Surgeon: Shelah Lamar RAMAN, MD;  Location: [facility] ENDOSCOPY;  Service: Pulmonary;;   IABP INSERTION N/A 02/03/2018   Procedure: IABP Insertion;  Surgeon: Jordan, Peter M, MD;  Location: [facility] INVASIVE CV LAB;  Service: Cardiovascular;  Laterality: N/A;   INTRAOPERATIVE TRANSESOPHAGEAL ECHOCARDIOGRAM N/A 02/03/2018   Procedure: INTRAOPERATIVE TRANSESOPHAGEAL ECHOCARDIOGRAM;  Surgeon: Dusty Sudie DEL, MD;  Location: [facility] OR;  Service: Open Heart Surgery;  Laterality: N/A;   LEFT HEART CATH AND CORONARY ANGIOGRAPHY N/A 02/03/2018   Procedure: LEFT HEART CATH AND CORONARY ANGIOGRAPHY;  Surgeon: Jordan, Peter M, MD;  Location: [facility] INVASIVE CV LAB;  Service: Cardiovascular;  Laterality: N/A;   LEFT HEART CATH AND CORS/GRAFTS ANGIOGRAPHY N/A 02/12/2019   Procedure: LEFT HEART CATH AND CORS/GRAFTS ANGIOGRAPHY;  Surgeon: Darron Deatrice LABOR, MD;  Location: [facility] INVASIVE CV LAB;  Service: Cardiovascular;  Laterality: N/A;   MEDIASTINOSCOPY N/A 03/11/2014   Procedure: MEDIASTINOSCOPY;  Surgeon: Elspeth JAYSON Millers, MD;  Location: [facility] OR;  Service: Thoracic;  Laterality: N/A;   PLACEMENT OF IMPELLA LEFT VENTRICULAR ASSIST DEVICE  02/03/2018   Procedure: PLACEMENT OF IMPELLA LEFT VENTRICULAR ASSIST DEVICE LD;  Surgeon: Dusty Sudie DEL, MD;  Location: [facility] OR;  Service: Open Heart Surgery;;   REMOVAL OF IMPELLA LEFT VENTRICULAR ASSIST DEVICE N/A 02/08/2018   Procedure: REMOVAL OF IMPELLA LEFT VENTRICULAR ASSIST DEVICE;  Surgeon: Dusty Sudie DEL, MD;  Location: [facility] OR;  Service: Open Heart Surgery;  Laterality: N/A;   RIGHT HEART CATH N/A 02/03/2018   Procedure: RIGHT HEART CATH;  Surgeon: Jordan, Peter M, MD;  Location: [facility] INVASIVE CV LAB;  Service: Cardiovascular;  Laterality: N/A;   RIGHT HEART CATH N/A 05/09/2018   Procedure: RIGHT HEART CATH;  Surgeon: Cherrie Toribio SAUNDERS, MD;   Location: [facility] INVASIVE CV LAB;  Service: Cardiovascular;  Laterality: N/A;   TEE WITHOUT CARDIOVERSION N/A 02/08/2018   Procedure: TRANSESOPHAGEAL ECHOCARDIOGRAM (TEE);  Surgeon: Dusty Sudie DEL, MD;  Location: [facility] OR;  Service: Open Heart Surgery;  Laterality: N/A;   TUBAL LIGATION     VIDEO BRONCHOSCOPY WITH ENDOBRONCHIAL ULTRASOUND N/A 03/11/2014   Procedure: VIDEO BRONCHOSCOPY WITH ENDOBRONCHIAL ULTRASOUND;  Surgeon: Elspeth JAYSON Millers, MD;  Location: [facility] OR;  Service: Thoracic;  Laterality: N/A;     A IV Location/Drains/Wounds Patient Lines/Drains/Airways Status     Active Line/Drains/Airways     Name Placement date Placement time Site Days   Peripheral IV 12/15/23 22 G Left;Posterior Hand 12/15/23  --  Hand  less than 1   Peripheral IV 12/15/23 20 G Anterior;Left Forearm 12/15/23  --  Forearm  less than 1   External Urinary Catheter 12/15/23  --  --  less than 1            Intake/Output Last 24 hours  Intake/Output Summary (Last 24 hours) at 12/15/2023 1402 Last data filed at 12/15/2023 0539 Gross per 24 hour  Intake 250 ml  Output 200 ml  Net 50 ml    Labs/Imaging Results for orders placed or performed during the hospital encounter of 12/14/23 (from the past 48 hours)  CBC with Differential     Status: Abnormal   Collection Time: 12/14/23  9:30 PM  Result Value Ref Range   WBC 13.3 (H) 4.0 - 10.5 K/uL   RBC 5.24 (H) 3.87 - 5.11 MIL/uL   Hemoglobin 15.4 (H) 12.0 - 15.0 g/dL   HCT 50.4 (H) 63.9 - 53.9 %   MCV 94.5 80.0 - 100.0 fL   MCH 29.4 26.0 - 34.0 pg   MCHC 31.1 30.0 - 36.0 g/dL   RDW 80.8 (H) 88.4 - 84.4 %   Platelets 247 150 - 400 K/uL   nRBC 0.0 0.0 - 0.2 %   Neutrophils Relative % 76 %   Neutro Abs 10.4 (H) 1.7 - 7.7 K/uL   Lymphocytes Relative 12 %   Lymphs Abs 1.6 0.7 - 4.0 K/uL   Monocytes Relative 9 %   Monocytes Absolute 1.1 (H) 0.1 - 1.0 K/uL   Eosinophils Relative 1 %   Eosinophils Absolute 0.1 0.0 - 0.5 K/uL   Basophils Relative 1 %   Basophils  Absolute 0.1 0.0 - 0.1 K/uL   Immature Granulocytes 1 %   Abs Immature Granulocytes 0.12 (H) 0.00 - 0.07 K/uL    Comment: Performed at [facility], [address] [place], [place] [address]  Comprehensive metabolic panel     Status: Abnormal   Collection Time: 12/14/23  9:30 PM  Result Value Ref Range   Sodium 139 135 - 145 mmol/L   Potassium 3.4 (L) 3.5 - 5.1 mmol/L   Chloride 99 98 - 111 mmol/L   CO2 20 (L) 22 - 32 mmol/L   Glucose, Bld 263 (H) 70 - 99 mg/dL    Comment: Glucose reference range applies only to samples taken after fasting for at least 8 hours.   BUN 20 8 - 23 mg/dL   Creatinine, Ser 8.32 (H) 0.44 - 1.00 mg/dL   Calcium  10.1 8.9 - 10.3 mg/dL   Total Protein 8.2 (H) 6.5 - 8.1 g/dL   Albumin  4.3 3.5 - 5.0 g/dL   AST 33 15 - 41 U/L   ALT 17 0 - 44 U/L   Alkaline Phosphatase 102 38 - 126 U/L   Total Bilirubin 1.3 (H) 0.0 - 1.2 mg/dL   GFR, Estimated 34 (L) >60 mL/min    Comment: (NOTE) Calculated using the CKD-EPI Creatinine Equation (2021)    Anion gap 20 (H) 5 - 15    Comment: Performed at [facility], [address] [place], [place] [address]  Brain natriuretic peptide     Status: Abnormal   Collection Time: 12/14/23  9:30 PM  Result Value Ref Range   B Natriuretic Peptide 2,946.2 (H) 0.0 - 100.0 pg/mL    Comment: Performed at [facility], [address], [place], [place] [address]  Procalcitonin     Status: None   Collection Time: 12/14/23  9:30 PM  Result Value Ref Range   Procalcitonin <0.10 ng/mL    Comment:        Interpretation: PCT (Procalcitonin) <= 0.5 ng/mL: Systemic infection (sepsis) is not likely. Local bacterial infection is possible. (NOTE)       Sepsis PCT Algorithm           Lower Respiratory Tract                                      Infection PCT Algorithm    ----------------------------     ----------------------------  PCT < 0.25 ng/mL                PCT < 0.10 ng/mL          Strongly encourage              Strongly discourage   discontinuation of antibiotics    initiation of antibiotics    ----------------------------     -----------------------------       PCT 0.25 - 0.50 ng/mL            PCT 0.10 - 0.25 ng/mL               OR       >80% decrease in PCT            Discourage initiation of                                            antibiotics      Encourage discontinuation           of antibiotics    ----------------------------     -----------------------------         PCT >= 0.50 ng/mL              PCT 0.26 - 0.50 ng/mL               AND        <80% decrease in PCT             Encourage initiation of                                             antibiotics       Encourage continuation           of antibiotics    ----------------------------     -----------------------------        PCT >= 0.50 ng/mL                  PCT > 0.50 ng/mL               AND         increase in PCT                  Strongly encourage                                      initiation of antibiotics    Strongly encourage escalation           of antibiotics                                     -----------------------------                                           PCT <= 0.25 ng/mL  OR                                        > 80% decrease in PCT                                      Discontinue / Do not initiate                                             antibiotics  Performed at [facility], [address], [place], [place] [address]   Resp panel by RT-PCR (RSV, Flu A&B, Covid) Anterior Nasal Swab     Status: None   Collection Time: 12/14/23  9:31 PM   Specimen: Anterior Nasal Swab  Result Value Ref Range   SARS Coronavirus 2 by RT PCR NEGATIVE NEGATIVE   Influenza A by PCR NEGATIVE NEGATIVE   Influenza B by PCR NEGATIVE NEGATIVE    Comment: (NOTE) The Xpert Xpress SARS-CoV-2/FLU/RSV plus assay is intended as an aid in the diagnosis of influenza  from Nasopharyngeal swab specimens and should not be used as a sole basis for treatment. Nasal washings and aspirates are unacceptable for Xpert Xpress SARS-CoV-2/FLU/RSV testing.  Fact Sheet for Patients: bloggercourse.com  Fact Sheet for Healthcare Providers: seriousbroker.it  This test is not yet approved or cleared by the [place]  FDA and has been authorized for detection and/or diagnosis of SARS-CoV-2 by FDA under an Emergency Use Authorization (EUA). This EUA will remain in effect (meaning this test can be used) for the duration of the COVID-19 declaration under Section 564(b)(1) of the Act, 21 U.S.C. section 360bbb-3(b)(1), unless the authorization is terminated or revoked.     Resp Syncytial Virus by PCR NEGATIVE NEGATIVE    Comment: (NOTE) Fact Sheet for Patients: bloggercourse.com  Fact Sheet for Healthcare Providers: seriousbroker.it  This test is not yet approved or cleared by the [place]  FDA and has been authorized for detection and/or diagnosis of SARS-CoV-2 by FDA under an Emergency Use Authorization (EUA). This EUA will remain in effect (meaning this test can be used) for the duration of the COVID-19 declaration under Section 564(b)(1) of the Act, 21 U.S.C. section 360bbb-3(b)(1), unless the authorization is terminated or revoked.  Performed at [facility], [address] [place], [place] [address]   I-Stat venous blood gas, [facility] ED, [facility], [facility])     Status: Abnormal   Collection Time: 12/14/23 10:58 PM  Result Value Ref Range   pH, Ven 7.377 7.25 - 7.43   pCO2, Ven 48.8 44 - 60 mmHg   pO2, Ven 35 32 - 45 mmHg   Bicarbonate 28.7 (H) 20.0 - 28.0 mmol/L   TCO2 30 22 - 32 mmol/L   O2 Saturation 66 %   Acid-Base Excess 2.0 0.0 - 2.0 mmol/L   Sodium 135 135 - 145 mmol/L   Potassium 5.7 (H) 3.5 - 5.1 mmol/L   Calcium , Ion 1.01 (L) 1.15 - 1.40  mmol/L   HCT 48.0 (H) 36.0 - 46.0 %   Hemoglobin 16.3 (H) 12.0 - 15.0 g/dL   Sample type VENOUS    Comment NOTIFIED PHYSICIAN   Culture, blood (Routine X 2) w  Reflex to ID Panel     Status: None (Preliminary result)   Collection Time: 12/15/23  1:05 AM   Specimen: BLOOD LEFT ARM  Result Value Ref Range   Specimen Description BLOOD LEFT ARM    Special Requests      BOTTLES DRAWN AEROBIC ONLY Blood Culture results may not be optimal due to an inadequate volume of blood received in culture bottles   Culture      NO GROWTH < 12 HOURS Performed at [facility], [address] [place], [place] [address]    Report Status PENDING   Culture, blood (Routine X 2) w Reflex to ID Panel     Status: None (Preliminary result)   Collection Time: 12/15/23  2:07 AM   Specimen: BLOOD RIGHT ARM  Result Value Ref Range   Specimen Description BLOOD RIGHT ARM    Special Requests      BOTTLES DRAWN AEROBIC AND ANAEROBIC Blood Culture adequate volume   Culture      NO GROWTH < 12 HOURS Performed at [facility], [address] [place], [place] [address]    Report Status PENDING   Comprehensive metabolic panel     Status: Abnormal   Collection Time: 12/15/23  2:08 AM  Result Value Ref Range   Sodium 136 135 - 145 mmol/L   Potassium 3.0 (L) 3.5 - 5.1 mmol/L   Chloride 97 (L) 98 - 111 mmol/L   CO2 23 22 - 32 mmol/L   Glucose, Bld 237 (H) 70 - 99 mg/dL    Comment: Glucose reference range applies only to samples taken after fasting for at least 8 hours.   BUN 23 8 - 23 mg/dL   Creatinine, Ser 8.25 (H) 0.44 - 1.00 mg/dL   Calcium  9.5 8.9 - 10.3 mg/dL   Total Protein 7.7 6.5 - 8.1 g/dL   Albumin  3.9 3.5 - 5.0 g/dL   AST 30 15 - 41 U/L   ALT 18 0 - 44 U/L   Alkaline Phosphatase 93 38 - 126 U/L   Total Bilirubin 1.0 0.0 - 1.2 mg/dL   GFR, Estimated 33 (L) >60 mL/min    Comment: (NOTE) Calculated using the CKD-EPI Creatinine Equation (2021)    Anion gap 16 (H) 5 - 15    Comment:  Performed at [facility], [address] [place], [place] [address]  CBC     Status: Abnormal   Collection Time: 12/15/23  2:08 AM  Result Value Ref Range   WBC 15.1 (H) 4.0 - 10.5 K/uL   RBC 4.79 3.87 - 5.11 MIL/uL   Hemoglobin 14.1 12.0 - 15.0 g/dL   HCT 56.0 63.9 - 53.9 %   MCV 91.6 80.0 - 100.0 fL   MCH 29.4 26.0 - 34.0 pg   MCHC 32.1 30.0 - 36.0 g/dL   RDW 81.3 (H) 88.4 - 84.4 %   Platelets 161 150 - 400 K/uL   nRBC 0.0 0.0 - 0.2 %    Comment: Performed at [facility], [address], [place], [place] [address]   DG Chest Portable 1 View Result Date: 12/14/2023 CLINICAL DATA:  09/25/2023 EXAM: PORTABLE CHEST 1 VIEW COMPARISON:  09/25/2023 FINDINGS: Cardiac shadow is stable. Defibrillator and postsurgical changes are again noted. Stimulator pack is noted in the right chest. Lungs are hyperinflated but clear. No bony abnormality is seen. IMPRESSION: No acute abnormality noted. Electronically Signed   By: Oneil Devonshire M.D.   On:  12/14/2023 22:56    Pending Labs Unresulted Labs (From admission, onward)     Start     Ordered   12/16/23 0500  CBC  Tomorrow morning,   R        12/15/23 1045   12/16/23 0500  Basic metabolic panel  Tomorrow morning,   R        12/15/23 1045   12/15/23 0100  Respiratory (~20 pathogens) panel by PCR  (Respiratory panel by PCR (~20 pathogens, ~24 hr TAT)  w precautions)  Add-on,   AD        12/15/23 0059   12/15/23 0059  Expectorated Sputum Assessment w Gram Stain, Rflx to Resp Cult  Once,   R       Question Answer Comment  Patient immune status Immunocompromised   Release to patient Immediate      12/15/23 0058            Vitals/Pain Today's Vitals   12/15/23 1130 12/15/23 1300 12/15/23 1315 12/15/23 1345  BP: 97/62 107/66  114/64  Pulse: 86 78 76 73  Resp: 17 16 14 18   Temp:      TempSrc:      SpO2: 100% 100% 100% 100%  Weight:      Height:      PainSc:        Isolation Precautions Droplet  precaution  Medications Medications  atorvastatin  (LIPITOR ) tablet 80 mg (80 mg Oral Given 12/15/23 1005)  digoxin  (LANOXIN ) tablet 125 mcg (125 mcg Oral Given 12/15/23 1006)  ivabradine  (CORLANOR ) tablet 7.5 mg (7.5 mg Oral Given 12/15/23 1023)  apixaban  (ELIQUIS ) tablet 2.5 mg (2.5 mg Oral Given 12/15/23 1005)  cyclobenzaprine  (FLEXERIL ) tablet 10 mg (has no administration in time range)  feeding supplement (ENSURE ENLIVE / ENSURE PLUS) liquid 237 mL (237 mLs Oral Not Given 12/15/23 1023)  ipratropium-albuterol  (DUONEB) 0.5-2.5 (3) MG/3ML nebulizer solution 3 mL (3 mLs Nebulization Given 12/15/23 1401)  cefTRIAXone  (ROCEPHIN ) 2 g in sodium chloride  0.9 % 100 mL IVPB (has no administration in time range)  acetaminophen  (TYLENOL ) tablet 650 mg (has no administration in time range)    Or  acetaminophen  (TYLENOL ) suppository 650 mg (has no administration in time range)  senna-docusate (Senokot-S) tablet 1 tablet (has no administration in time range)  pantoprazole  (PROTONIX ) EC tablet 40 mg (40 mg Oral Given 12/15/23 0212)  levalbuterol  (XOPENEX ) nebulizer solution 0.63 mg (has no administration in time range)  acetylcysteine  (MUCOMYST ) 20 % nebulizer / oral solution 3 mL (3 mLs Nebulization Given 12/15/23 0908)  guaiFENesin -dextromethorphan  (ROBITUSSIN DM) 100-10 MG/5ML syrup 5 mL (5 mLs Oral Given 12/15/23 1231)  midodrine  (PROAMATINE ) tablet 5 mg (5 mg Oral Given 12/15/23 1231)  methylPREDNISolone  sodium succinate (SOLU-MEDROL ) 125 mg/2 mL injection 60 mg (has no administration in time range)  furosemide  (LASIX ) injection 40 mg (40 mg Intravenous Given 12/15/23 1232)  potassium chloride  (KLOR-CON ) packet 40 mEq (has no administration in time range)  albuterol  (PROVENTIL ) (2.5 MG/3ML) 0.083% nebulizer solution (10 mg/hr Nebulization Given 12/14/23 2206)  ondansetron  (ZOFRAN ) 4 MG/2ML injection (  Given 12/14/23 2150)  furosemide  (LASIX ) 120 mg in dextrose  5 % 50 mL IVPB (0 mg Intravenous Stopped 12/15/23 0104)   potassium chloride  (KLOR-CON ) packet 40 mEq (40 mEq Oral Given 12/15/23 0214)  cefTRIAXone  (ROCEPHIN ) 2 g in sodium chloride  0.9 % 100 mL IVPB (0 g Intravenous Stopped 12/15/23 0259)    Mobility Walks with person assist     Focused Assessments    R Recommendations:  See Admitting Provider Note  Report given to:   Additional Notes:

## 2023-12-15 NOTE — ED Notes (Signed)
Dr. Joseph at bedside 

## 2023-12-15 NOTE — Progress Notes (Signed)
 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  Also reports productive cough,?  Low-grade fevers yesterday -In the ED she was hypoxic and in distress, placed on BiPAP, given high-dose IV Lasix , labs with BNP 2946, creatinine 1.6, CBC with WBC 13.3, chest x-ray without acute findings -Patient seen and examined in the ER, admitted this morning by my partner  Subjective: -Breathing a little better  Assessment and Plan:  Acute on chronic hypoxic and hypercarbic respiratory failure -Combination of systolic CHF and COPD -Diuretics as noted below, BiPAP nightly, wean O2 as tolerated  Acute on chronic systolic CHF Last echo 10/24 with a EF 20-25%, grade 2 diastolic dysfunction -She is clinically volume overloaded continue IV Lasix  40 Mg twice daily, continue digoxin  and ivabradine  -GDMT limited by hypotension, Farxiga  and Aldactone  on hold, on midodrine  now  COPD with acute exacerbation Chronic hypoxic respiratory failure on 2 L O2 at baseline -Flu COVID and RSV PCR were negative, chest x-ray largely unremarkable -Discontinue antibiotics, continue IV steroids today and DuoNebs, pulmonary toilet -Incentive spirometry BiPAP nightly  CAD/CABG Stable continue Lipitor  and Eliquis   AKI on CKD 3B -Baseline creatinine 1.3-1.4, creatinine slightly higher at 1.6 on admission, likely cardiorenal, monitor with diuresis, avoid hypotension  Paroxysmal A-fib Continue digoxin  Eliquis   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  Moderate  protein calorie malnutrition   DVT prophylaxis: Eliquis  Code Status: Discussed CODE STATUS with the patient again at length, she was previously DNR, after further discussion she would like to have a natural death and wishes for DNR Family Communication: No family at bedside Disposition Plan: Home when improved  Consultants:    Procedures:   Antimicrobials:    Objective: Vitals:   12/15/23 0700 12/15/23 0900 12/15/23 0950 12/15/23 1015  BP: (!) 99/57 117/66 116/67 106/61  Pulse: 81 85 93 90  Resp: (!) 27 19 18 20   Temp:   98.6 F (37 C)   TempSrc:   Oral   SpO2: 97% 100% 99% 97%  Weight:      Height:        Intake/Output Summary (Last 24 hours) at 12/15/2023 1037 Last data filed at 12/15/2023 0539 Gross per 24 hour  Intake 250 ml  Output 200 ml  Net 50 ml   Filed Weights   12/14/23 2151  Weight: 43.6 kg    Examination:  General exam: Chronically ill female sitting up in bed, AAOx3, mildly tachypneic HEENT: Positive JVD CVS: S1-S2, regular rhythm Lungs: Poor air movement bilaterally Abdomen: Soft, nontender, bowel sounds present Remedies: Trace edema Skin: No rashes Psychiatry:  Mood & affect appropriate.     Data Reviewed:   CBC: Recent Labs  Lab 12/14/23 2130 12/14/23 2258 12/15/23 0208  WBC 13.3*  --  15.1*  NEUTROABS 10.4*  --   --   HGB 15.4* 16.3* 14.1  HCT 49.5* 48.0* 43.9  MCV 94.5  --  91.6  PLT 247  --  161   Basic Metabolic Panel: Recent Labs  Lab 12/10/23 1037 12/14/23 2130 12/14/23  2258 12/15/23 0208  NA 137 139 135 136  K 3.5 3.4* 5.7* 3.0*  CL 101 99  --  97*  CO2 24 20*  --  23  GLUCOSE 153* 263*  --  237*  BUN 17 20  --  23  CREATININE 1.20* 1.67*  --  1.74*  CALCIUM  10.1 10.1  --  9.5   GFR: Estimated Creatinine Clearance: 22.6 mL/min (A) (by C-G formula based on SCr of 1.74 mg/dL (H)). Liver Function Tests: Recent Labs  Lab 12/14/23 2130 12/15/23 0208  AST 33 30  ALT 17 18  ALKPHOS 102 93  BILITOT 1.3* 1.0   PROT 8.2* 7.7  ALBUMIN  4.3 3.9   No results for input(s): LIPASE, AMYLASE in the last 168 hours. No results for input(s): AMMONIA in the last 168 hours. Coagulation Profile: No results for input(s): INR, PROTIME in the last 168 hours. Cardiac Enzymes: No results for input(s): CKTOTAL, CKMB, CKMBINDEX, TROPONINI in the last 168 hours. BNP (last 3 results) No results for input(s): PROBNP in the last 8760 hours. HbA1C: No results for input(s): HGBA1C in the last 72 hours. CBG: No results for input(s): GLUCAP in the last 168 hours. Lipid Profile: No results for input(s): CHOL, HDL, LDLCALC, TRIG, CHOLHDL, LDLDIRECT in the last 72 hours. Thyroid  Function Tests: No results for input(s): TSH, T4TOTAL, FREET4, T3FREE, THYROIDAB in the last 72 hours. Anemia Panel: No results for input(s): VITAMINB12, FOLATE, FERRITIN, TIBC, IRON, RETICCTPCT in the last 72 hours. Urine analysis:    Component Value Date/Time   COLORURINE YELLOW 05/23/2023 1519   APPEARANCEUR CLEAR 05/23/2023 1519   LABSPEC 1.013 05/23/2023 1519   PHURINE 6.0 05/23/2023 1519   GLUCOSEU NEGATIVE 05/23/2023 1519   HGBUR NEGATIVE 05/23/2023 1519   BILIRUBINUR NEGATIVE 05/23/2023 1519   KETONESUR NEGATIVE 05/23/2023 1519   PROTEINUR NEGATIVE 05/23/2023 1519   UROBILINOGEN 1.0 03/15/2015 1117   NITRITE NEGATIVE 05/23/2023 1519   LEUKOCYTESUR NEGATIVE 05/23/2023 1519   Sepsis Labs: @LABRCNTIP (procalcitonin:4,lacticidven:4)  ) Recent Results (from the past 240 hours)  Resp panel by RT-PCR (RSV, Flu A&B, Covid) Anterior Nasal Swab     Status: None   Collection Time: 12/14/23  9:31 PM   Specimen: Anterior Nasal Swab  Result Value Ref Range Status   SARS Coronavirus 2 by RT PCR NEGATIVE NEGATIVE Final   Influenza A by PCR NEGATIVE NEGATIVE Final   Influenza B by PCR NEGATIVE NEGATIVE Final    Comment: (NOTE) The Xpert Xpress SARS-CoV-2/FLU/RSV plus assay is  intended as an aid in the diagnosis of influenza from Nasopharyngeal swab specimens and should not be used as a sole basis for treatment. Nasal washings and aspirates are unacceptable for Xpert Xpress SARS-CoV-2/FLU/RSV testing.  Fact Sheet for Patients: bloggercourse.com  Fact Sheet for Healthcare Providers: seriousbroker.it  This test is not yet approved or cleared by the [place]  FDA and has been authorized for detection and/or diagnosis of SARS-CoV-2 by FDA under an Emergency Use Authorization (EUA). This EUA will remain in effect (meaning this test can be used) for the duration of the COVID-19 declaration under Section 564(b)(1) of the Act, 21 U.S.C. section 360bbb-3(b)(1), unless the authorization is terminated or revoked.     Resp Syncytial Virus by PCR NEGATIVE NEGATIVE Final    Comment: (NOTE) Fact Sheet for Patients: bloggercourse.com  Fact Sheet for Healthcare Providers: seriousbroker.it  This test is not yet approved or cleared by the [place]  FDA and has been authorized for detection and/or diagnosis of SARS-CoV-2 by  FDA under an Emergency Use Authorization (EUA). This EUA will remain in effect (meaning this test can be used) for the duration of the COVID-19 declaration under Section 564(b)(1) of the Act, 21 U.S.C. section 360bbb-3(b)(1), unless the authorization is terminated or revoked.  Performed at [facility], [address] [place], [place] [address]   Culture, blood (Routine X 2) w Reflex to ID Panel     Status: None (Preliminary result)   Collection Time: 12/15/23  1:05 AM   Specimen: BLOOD LEFT ARM  Result Value Ref Range Status   Specimen Description BLOOD LEFT ARM  Final   Special Requests   Final    BOTTLES DRAWN AEROBIC ONLY Blood Culture results may not be optimal due to an inadequate volume of blood received in culture bottles    Culture   Final    NO GROWTH < 12 HOURS Performed at [facility], [address] [place], [place] [address]    Report Status PENDING  Incomplete  Culture, blood (Routine X 2) w Reflex to ID Panel     Status: None (Preliminary result)   Collection Time: 12/15/23  2:07 AM   Specimen: BLOOD RIGHT ARM  Result Value Ref Range Status   Specimen Description BLOOD RIGHT ARM  Final   Special Requests   Final    BOTTLES DRAWN AEROBIC AND ANAEROBIC Blood Culture adequate volume   Culture   Final    NO GROWTH < 12 HOURS Performed at [facility], [address], [place], [place] [address]    Report Status PENDING  Incomplete     Radiology Studies: DG Chest Portable 1 View Result Date: 12/14/2023 CLINICAL DATA:  09/25/2023 EXAM: PORTABLE CHEST 1 VIEW COMPARISON:  09/25/2023 FINDINGS: Cardiac shadow is stable. Defibrillator and postsurgical changes are again noted. Stimulator pack is noted in the right chest. Lungs are hyperinflated but clear. No bony abnormality is seen. IMPRESSION: No acute abnormality noted. Electronically Signed   By: Oneil Devonshire M.D.   On: 12/14/2023 22:56     Scheduled Meds:  acetylcysteine   3 mL Nebulization TID   apixaban   2.5 mg Oral BID   atorvastatin   80 mg Oral Daily   digoxin   125 mcg Oral Daily   feeding supplement  237 mL Oral Daily   guaiFENesin -dextromethorphan   5 mL Oral Q6H   ipratropium-albuterol   3 mL Nebulization Q6H   ivabradine   7.5 mg Oral BID WC   methylPREDNISolone  (SOLU-MEDROL ) injection  80 mg Intravenous Q12H   midodrine   5 mg Oral TID WC   pantoprazole   40 mg Oral Daily   Continuous Infusions:  [START ON 12/16/2023] cefTRIAXone  (ROCEPHIN )  IV     doxycycline  (VIBRAMYCIN ) IV Stopped (12/15/23 0527)     LOS: 0 days    Time spent:    Sigurd Pac, MD [facility]   12/15/2023, 10:37 AM

## 2023-12-15 NOTE — H&P (Addendum)
 History and Physical    Brittany [name]  FMW:[id number] DOB: [birth year] DOA: 12/14/2023  PCP: Tanda Bleacher, MD   Patient coming from: Motel   Chief Complaint:  Chief Complaint  Patient presents with   Respiratory Distress   ED TRIAGE note:  BIB [facility] from a motel, unknown home address for respiratory distress, per ems patient was lethargic, heard rales, rhonchi and wheezes, history of copd and chf. Patient was brought in on CPAP  En route patient given duoneb x3, IM epinephrine  0.3mg , 125mg  IV solumedrol, 0.4mg  of nitroglycerin  sublingual and 2g of mag sulfate.     HPI:  Brittany [name]  is a 63 y.o. 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 Initially patient has been placed on CPAP however patient vomited and not appropriate for CPAP or BiPAP.  Patient has been verified and confirmed that she is DNR and have a living will.  Patient does not to be intubated.  Initial presentation to ED patient was tripoding and have significant lethargy.  In route to ED patient received treatment with Solu-Medrol , DuoNeb, epinephrine , nitroglycerin  and magnesium .  While patient in the ED has been transition to nasal cannula oxygen currently on 6 L maintaining O2 sat 99 to 100%.  Initially was tachypneic 31 improved to 20 and tachycardic 140 which is improved 117.  Blood pressure is borderline soft.  VBG showed pH 7.3, pCO2 48, pO2 35 and bicarb 28. Respiratory panel negative for flu, COVID and RSV. Found to have elevated BNP 2946 (baseline BNP 1402 to 2000) CMP showed low potassium 3.4, low bicarb 20, elevated blood  close 263, elevated creatinine 1.67 elevated bilirubin 1.3, GFR 34 and elevated anion gap 20. CBC showing leukocytosis 13.3 stable H&H and platelet count 247.  Chest x-ray no acute cardiopulmonary process.  Stable cardiac silhouette. Pending EKG.  In the ED patient has been treated with albuterol  nebulizer 10 mg and Lasix  120 mg.  Hospitalist has been contacted for further evaluation and management of respiratory distress in the context of CHF exacerbation and COPD exacerbation.    Significant labs in the ED: Lab Orders         Resp panel by RT-PCR (RSV, Flu A&B, Covid) Anterior Nasal Swab         Expectorated Sputum Assessment w Gram Stain, Rflx to Resp Cult         Respiratory (~20 pathogens) panel by PCR         Culture, blood (Routine X 2) w Reflex to ID Panel         CBC with Differential         Comprehensive metabolic panel         Brain natriuretic peptide         Procalcitonin         Comprehensive metabolic panel         CBC         I-Stat arterial blood gas, ED ([facility], [facility], [facility])         I-Stat venous blood gas, ([facility], [facility], [facility])       Review of Systems:  Review of Systems  Constitutional:  Positive for chills, fever and malaise/fatigue. Negative for weight loss.  Respiratory:  Positive for cough, sputum production, shortness of breath and wheezing.   Cardiovascular:  Negative for chest pain, palpitations and leg swelling.  Gastrointestinal:  Negative for heartburn and nausea.  Musculoskeletal:  Negative for myalgias and neck pain.  Neurological:  Negative for dizziness and headaches.  Endo/Heme/Allergies:  Does not bruise/bleed easily.  Psychiatric/Behavioral:  The patient is not nervous/anxious.   All other systems reviewed and are negative.   Past Medical History:  Diagnosis Date   Acute respiratory failure (HCC) 05/05/2018   Acute systolic congestive heart failure (HCC) 02/03/2018   AICD (automatic cardioverter/defibrillator) present    Allergy    Anxiety     Asthma    DM2 (diabetes mellitus, type 2) (HCC) 10/20/2020   Hypertension    PAF (paroxysmal atrial fibrillation) (HCC)    Presence of permanent cardiac pacemaker    Prophylactic measure 08/03/14-08/19/14   Prophyl. 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   LIMA to LAD, SVG to D1, SVG to OM1, EVH via right thigh with implantation of Impella LD LVAD via direct aortic approach   Small cell lung cancer (HCC) 03/16/2014    Past Surgical History:  Procedure Laterality Date   BRONCHIAL BRUSHINGS  10/25/2020   Procedure: BRONCHIAL BRUSHINGS;  Surgeon: Shelah Lamar RAMAN, MD;  Location: [facility] ENDOSCOPY;  Service: Pulmonary;;   BRONCHIAL NEEDLE ASPIRATION BIOPSY  10/25/2020   Procedure: BRONCHIAL NEEDLE ASPIRATION BIOPSIES;  Surgeon: Shelah Lamar RAMAN, MD;  Location: [facility] ENDOSCOPY;  Service: Pulmonary;;   CARDIAC DEFIBRILLATOR PLACEMENT  08/15/2018   MDT Visia AF MRI VR ICD implanted by Dr Zulma for primary prevention of sudden   CESAREAN SECTION     CORONARY ARTERY BYPASS GRAFT N/A 02/03/2018   Procedure: CORONARY ARTERY BYPASS GRAFTING (CABG);  Surgeon: Dusty Sudie DEL, MD;  Location: [facility] OR;  Service: Open Heart Surgery;  Laterality: N/A;  Time 3 using left internal mammary artery and endoscopically harvested right saphenous vein   CORONARY BALLOON ANGIOPLASTY N/A 02/03/2018   Procedure: CORONARY BALLOON ANGIOPLASTY;  Surgeon: Jordan, Peter M, MD;  Location: [facility] INVASIVE CV LAB;  Service: Cardiovascular;  Laterality: N/A;   CORONARY STENT INTERVENTION N/A 02/12/2019   Procedure: CORONARY STENT INTERVENTION;  Surgeon: Darron Deatrice LABOR, MD;  Location: [facility] INVASIVE CV LAB;  Service: Cardiovascular;  Laterality: N/A;   CORONARY/GRAFT ACUTE MI REVASCULARIZATION N/A 02/03/2018   Procedure: Coronary/Graft Acute MI Revascularization;  Surgeon: Jordan, Peter M, MD;  Location: [facility] INVASIVE CV LAB;  Service: Cardiovascular;  Laterality: N/A;   ENDOBRONCHIAL ULTRASOUND N/A 10/25/2020   Procedure:  ENDOBRONCHIAL ULTRASOUND;  Surgeon: Shelah Lamar RAMAN, MD;  Location: [facility] ENDOSCOPY;  Service: Pulmonary;  Laterality: N/A;   FLEXIBLE BRONCHOSCOPY  10/25/2020   Procedure: FLEXIBLE BRONCHOSCOPY;  Surgeon: Shelah Lamar RAMAN, MD;  Location: [facility] ENDOSCOPY;  Service: Pulmonary;;   IABP INSERTION N/A 02/03/2018   Procedure: IABP Insertion;  Surgeon: Jordan, Peter M, MD;  Location: [facility] INVASIVE CV LAB;  Service: Cardiovascular;  Laterality: N/A;   INTRAOPERATIVE TRANSESOPHAGEAL ECHOCARDIOGRAM N/A 02/03/2018   Procedure: INTRAOPERATIVE TRANSESOPHAGEAL ECHOCARDIOGRAM;  Surgeon: Dusty Sudie DEL, MD;  Location: [facility] OR;  Service: Open Heart Surgery;  Laterality: N/A;   LEFT HEART CATH AND CORONARY ANGIOGRAPHY N/A 02/03/2018   Procedure: LEFT HEART CATH AND CORONARY ANGIOGRAPHY;  Surgeon: Jordan, Peter M, MD;  Location: [facility] INVASIVE CV LAB;  Service: Cardiovascular;  Laterality: N/A;   LEFT HEART CATH AND CORS/GRAFTS ANGIOGRAPHY N/A 02/12/2019   Procedure: LEFT HEART CATH AND  CORS/GRAFTS ANGIOGRAPHY;  Surgeon: Darron Deatrice LABOR, MD;  Location: [facility] INVASIVE CV LAB;  Service: Cardiovascular;  Laterality: N/A;   MEDIASTINOSCOPY N/A 03/11/2014   Procedure: MEDIASTINOSCOPY;  Surgeon: Elspeth JAYSON Millers, MD;  Location: [facility] OR;  Service: Thoracic;  Laterality: N/A;   PLACEMENT OF IMPELLA LEFT VENTRICULAR ASSIST DEVICE  02/03/2018   Procedure: PLACEMENT OF IMPELLA LEFT VENTRICULAR ASSIST DEVICE LD;  Surgeon: Dusty Sudie DEL, MD;  Location: [facility] OR;  Service: Open Heart Surgery;;   REMOVAL OF IMPELLA LEFT VENTRICULAR ASSIST DEVICE N/A 02/08/2018   Procedure: REMOVAL OF IMPELLA LEFT VENTRICULAR ASSIST DEVICE;  Surgeon: Dusty Sudie DEL, MD;  Location: [facility] OR;  Service: Open Heart Surgery;  Laterality: N/A;   RIGHT HEART CATH N/A 02/03/2018   Procedure: RIGHT HEART CATH;  Surgeon: Jordan, Peter M, MD;  Location: [facility] INVASIVE CV LAB;  Service: Cardiovascular;  Laterality: N/A;   RIGHT HEART CATH N/A 05/09/2018   Procedure: RIGHT HEART CATH;   Surgeon: Cherrie Toribio SAUNDERS, MD;  Location: [facility] INVASIVE CV LAB;  Service: Cardiovascular;  Laterality: N/A;   TEE WITHOUT CARDIOVERSION N/A 02/08/2018   Procedure: TRANSESOPHAGEAL ECHOCARDIOGRAM (TEE);  Surgeon: Dusty Sudie DEL, MD;  Location: [facility] OR;  Service: Open Heart Surgery;  Laterality: N/A;   TUBAL LIGATION     VIDEO BRONCHOSCOPY WITH ENDOBRONCHIAL ULTRASOUND N/A 03/11/2014   Procedure: VIDEO BRONCHOSCOPY WITH ENDOBRONCHIAL ULTRASOUND;  Surgeon: Elspeth JAYSON Millers, MD;  Location: [facility] OR;  Service: Thoracic;  Laterality: N/A;     reports that she quit smoking about 9 years ago. Her smoking use included cigarettes. She started smoking about 29 years ago. She has a 20 pack-year smoking history. She has never used smokeless tobacco. She reports current alcohol use of about 5.0 standard drinks of alcohol per week. She reports current drug use. Drug: Marijuana.  Allergies  Allergen Reactions   Lactose Intolerance (Gi)     Per patient   Codeine Nausea And Vomiting    Family History  Problem Relation Age of Onset   Heart disease Mother    Hypertension Mother    Heart attack Mother    Hypertension Maternal Grandmother    Cancer Maternal Grandmother    Diabetes Paternal Grandmother    Stroke Neg Hx     Prior to Admission medications   Medication Sig Start Date End Date Taking? Authorizing Provider  albuterol  (VENTOLIN  HFA) 108 (90 Base) MCG/ACT inhaler Inhale 2 puffs into the lungs every 6 (six) hours as needed for wheezing or shortness of breath. 06/20/22   Fairy Frames, MD  atorvastatin  (LIPITOR ) 80 MG tablet Take 1 tablet (80 mg total) by mouth daily. 08/06/23 11/20/24  Nicholas Bar, MD  cyclobenzaprine  (FLEXERIL ) 10 MG tablet Take 1 tablet (10 mg total) by mouth 3 (three) times daily as needed for muscle spasms. 10/11/23   Tanda Bleacher, MD  digoxin  (LANOXIN ) 0.125 MG tablet TAKE 1 TABLET BY MOUTH DAILY 04/23/23   Glena Harlene HERO, FNP  ELIQUIS  2.5 MG TABS tablet TAKE 1 TABLET  BY MOUTH TWICE A DAY 12/07/23   Bensimhon, Toribio SAUNDERS, MD  FARXIGA  10 MG TABS tablet TAKE 1 TABLET BY MOUTH EVERY DAY 11/21/23   Bensimhon, Toribio SAUNDERS, MD  Fluticasone -Umeclidin-Vilant (TRELEGY ELLIPTA ) 200-62.5-25 MCG/ACT AEPB Inhale 1 puff into the lungs daily. 08/16/23   Olalere, Jennet LABOR, MD  guaiFENesin -dextromethorphan  (ROBITUSSIN DM) 100-10 MG/5ML syrup Take 5 mLs by mouth every 6 (six) hours as needed for cough. 09/29/23   Arrien, Elidia Toribio, MD  hydrOXYzine  (VISTARIL )  25 MG capsule TAKE 1 CAPSULE (25 MG TOTAL) BY MOUTH DAILY AS NEEDED. 07/30/23   Lorren Greig PARAS, NP  ipratropium-albuterol  (DUONEB) 0.5-2.5 (3) MG/3ML SOLN Take 3 mLs by nebulization every 4 (four) hours as needed (for shortness of breath). 07/12/22   Rolan Ezra RAMAN, MD  ivabradine  (CORLANOR ) 7.5 MG TABS tablet TAKE 1 TABLET BY MOUTH TWICE A DAY WITH A MEAL 10/12/23   Bensimhon, Toribio SAUNDERS, MD  metolazone  (ZAROXOLYN ) 2.5 MG tablet Take 1 tablet (2.5 mg total) by mouth 2 (two) times a week. Take twice weekly on Tuesday and Thursday with extra 40 mEq of Potassium 11/22/23   Lavalette, [place], [place]  nitroGLYCERIN  (NITROSTAT ) 0.4 MG SL tablet Place 1 tablet (0.4 mg total) under the tongue every 5 (five) minutes as needed for chest pain. 02/14/19   Lesia Ozell Barter, PA-C  Nutritional Supplements (ENSURE ORIGINAL) LIQD Take 2-3 Bottles by mouth daily.    [provider]  potassium chloride  (KLOR-CON ) 10 MEQ tablet Take 8 tablets (80 mEq total) by mouth 2 (two) times daily. Take extra 4 tablets (40 mEq total) with weekly Metolazone  dose 11/26/23   [name], Harlene HERO, FNP  spironolactone  (ALDACTONE ) 25 MG tablet Take 0.5 tablets (12.5 mg total) by mouth at bedtime. 09/29/23 12/28/23  Arrien, Elidia Toribio, MD  torsemide  (DEMADEX ) 20 MG tablet Take 4 tablets (80 mg total) by mouth 2 (two) times daily. 10/09/23 01/07/24  Glena Harlene HERO, FNP     Physical Exam: Vitals:   12/14/23 2330 12/15/23 0000 12/15/23 0035 12/15/23  0100  BP: 105/65 96/61 (!) 95/59 95/62  Pulse: (!) 117 (!) 110 (!) 110 (!) 107  Resp: 20 20 16 14   Temp:    (!) 97.4 F (36.3 C)  TempSrc:      SpO2: 100% 96% 98% 98%  Weight:      Height:        Physical Exam Vitals reviewed.  Constitutional:      General: She is not in acute distress.    Appearance: She is ill-appearing.     Comments: Cachectic and ill-appearing  HENT:     Mouth/Throat:     Mouth: Mucous membranes are dry.  Eyes:     Conjunctiva/sclera: Conjunctivae normal.  Cardiovascular:     Rate and Rhythm: Regular rhythm. Tachycardia present.     Pulses: Normal pulses.     Heart sounds: Normal heart sounds.  Pulmonary:     Breath sounds: No stridor. Wheezing, rhonchi and rales present.  Abdominal:     General: Abdomen is flat.  Musculoskeletal:        General: No swelling.     Cervical back: Neck supple.     Right lower leg: No edema.     Left lower leg: No edema.  Skin:    General: Skin is dry.     Capillary Refill: Capillary refill takes less than 2 seconds.     Findings: No bruising, erythema or lesion.  Neurological:     Mental Status: She is oriented to person, place, and time.  Psychiatric:        Mood and Affect: Mood normal.        Thought Content: Thought content normal.        Judgment: Judgment normal.      Labs on Admission: I have personally reviewed following labs and imaging studies  CBC: Recent Labs  Lab 12/14/23 2130 12/14/23 2258  WBC 13.3*  --   NEUTROABS 10.4*  --  HGB 15.4* 16.3*  HCT 49.5* 48.0*  MCV 94.5  --   PLT 247  --    Basic Metabolic Panel: Recent Labs  Lab 12/10/23 1037 12/14/23 2130 12/14/23 2258  NA 137 139 135  K 3.5 3.4* 5.7*  CL 101 99  --   CO2 24 20*  --   GLUCOSE 153* 263*  --   BUN 17 20  --   CREATININE 1.20* 1.67*  --   CALCIUM  10.1 10.1  --    GFR: Estimated Creatinine Clearance: 23.5 mL/min (A) (by C-G formula based on SCr of 1.67 mg/dL (H)). Liver Function Tests: Recent Labs  Lab  12/14/23 2130  AST 33  ALT 17  ALKPHOS 102  BILITOT 1.3*  PROT 8.2*  ALBUMIN  4.3   No results for input(s): LIPASE, AMYLASE in the last 168 hours. No results for input(s): AMMONIA in the last 168 hours. Coagulation Profile: No results for input(s): INR, PROTIME in the last 168 hours. Cardiac Enzymes: No results for input(s): CKTOTAL, CKMB, CKMBINDEX, TROPONINI, TROPONINIHS in the last 168 hours. BNP (last 3 results) Recent Labs    10/30/23 1017 11/21/23 1224 12/14/23 2130  BNP 1,854.8* 1,483.7* 2,946.2*   HbA1C: No results for input(s): HGBA1C in the last 72 hours. CBG: No results for input(s): GLUCAP in the last 168 hours. Lipid Profile: No results for input(s): CHOL, HDL, LDLCALC, TRIG, CHOLHDL, LDLDIRECT in the last 72 hours. Thyroid  Function Tests: No results for input(s): TSH, T4TOTAL, FREET4, T3FREE, THYROIDAB in the last 72 hours. Anemia Panel: No results for input(s): VITAMINB12, FOLATE, FERRITIN, TIBC, IRON, RETICCTPCT in the last 72 hours. Urine analysis:    Component Value Date/Time   COLORURINE YELLOW 05/23/2023 1519   APPEARANCEUR CLEAR 05/23/2023 1519   LABSPEC 1.013 05/23/2023 1519   PHURINE 6.0 05/23/2023 1519   GLUCOSEU NEGATIVE 05/23/2023 1519   HGBUR NEGATIVE 05/23/2023 1519   BILIRUBINUR NEGATIVE 05/23/2023 1519   KETONESUR NEGATIVE 05/23/2023 1519   PROTEINUR NEGATIVE 05/23/2023 1519   UROBILINOGEN 1.0 03/15/2015 1117   NITRITE NEGATIVE 05/23/2023 1519   LEUKOCYTESUR NEGATIVE 05/23/2023 1519    Radiological Exams on Admission: I have personally reviewed images DG Chest Portable 1 View Result Date: 12/14/2023 CLINICAL DATA:  09/25/2023 EXAM: PORTABLE CHEST 1 VIEW COMPARISON:  09/25/2023 FINDINGS: Cardiac shadow is stable. Defibrillator and postsurgical changes are again noted. Stimulator pack is noted in the right chest. Lungs are hyperinflated but clear. No bony abnormality is seen.  IMPRESSION: No acute abnormality noted. Electronically Signed   By: Oneil Devonshire M.D.   On: 12/14/2023 22:56     EKG: Pending EKG.    Assessment/Plan: Principal Problem:   Respiratory distress Active Problems:   Acute combined systolic and diastolic CHF, NYHA class 4 (HCC)   Acute hypoxic respiratory failure (HCC)   COPD with acute exacerbation (HCC)   History of CAD (coronary artery disease)   Essential hypertension   Acute kidney injury superimposed on chronic kidney disease (HCC)   Hx of hypokalemia    Assessment and Plan: Acute respiratory distress secondary to CHF and COPD exacerbation > Patient presenting with complaining of shortness of breath which is not improving with using inhaler at home.  Patient also reported associated cough for last few days and patient has fever and chill in last 24 hours.  And route to ED via EMS patient has been treated with Solu-Medrol , epinephrine , DuoNeb breathing treatment and patient has been placed on CPAP however patient vomited so CPAP has been transition  to nasal cannula oxygen however after the breathing treatment patient has been maintaining O2 sat 99 to 100% on 4 to 5 L oxygen. - Also found to have elevated BNP around 2946. -Respiratory distress in the context of both CHF and COPD exacerbation - Treating patient both for CHF exacerbation and COPD exacerbation as following.   Acute on chronic combined systolic diastolic heart failure History of combined heart failure reduced EF 20 to 25% status post ICD/defibrillator Essential hypertension -Echo from 09/30/2023 showed grade 2 diastolic heart failure and reduced EF 20 to 25%.  Patient presenting with shortness of breath and productive cough.  Chest x-ray unremarkable.  Physical exam euvolemic and evidence of JVD.  Elevated BNP around 3000.  Baseline around 1400-2000.  -With the concern for CHF exacerbation in the ED patient has been treated with Lasix  120 mg.  Holding further diuretics as  patient is evolving on physical exam and hypotensive as well. -Based on the volume status, urine output and blood pressure trend will resume further IV diuretics. - Continue digoxin  0.125 mg daily and ivabradine  7.5 mg twice daily. -In the setting of low blood pressure and reduced EF 20 to 25% starting midodrine  5 mg twice daily. -Holding Farxiga  and spironolactone  in the setting of hypotension and AKI. - Obtaining limited echocardiogram. -Continue to monitor I's/O, daily weight and cardiac monitoring for arrhythmia. Addendum: -Patient's blood pressure is persistently soft.  Increasing midodrine  5 mg twice daily to 3 times daily and continue to monitor for improvement of blood pressure.  Holding further diuretics in the setting of hypotension.   COPD with acute exacerbation Chronic hypoxic respiratory failure-reported not using oxygen at baseline -Patient reported fever, chill and cough which has been progressively getting worse.  Currently living in a motel.  History of end-stage COPD. - Chest x-ray no acute disease process. - VBG showed pH 7.3, pCO2 48, pO2 35 and bicarb 28. -Initial presentation to ED patient was tripoding and not tolerating BiPAP due to vomiting.  Currently O2 sat 99 to 100% on 6 L oxygen. -Patient has been treated with Solu-Medrol  125 mg, DuoNeb breathing treatment and albuterol  10 mg while in the ED. - Plan to continue Solu-Medrol  80 mg twice daily, DuoNeb every 6 hours scheduled and Xopenex  every 4 hour as needed for any wheezing shortness of breath.  Continue Mucomyst  3 times daily. -Given patient has significant severe exacerbation coming a fever, chill and sputum production checking 20 respiratory panel, sputum cultures and blood cultures. - Starting broad-spectrum antibiotic coverage with ceftriaxone  2 g daily and doxycycline  100 mg twice daily. -Checking procalcitonin level.  Based on the procalcitonin level, blood and sputum culture can de-escalate  antibiotics. -Continue aspiration precaution, pulmonary toiletry, flutter valve and spirometry. - Continue Robitussin every 6 hours. -Continue supplemental oxygen to keep O2 sat above 92% and will wean down oxygen as patient tolerates.  History of CAD status post CABG -Currently on Eliquis  2.5 mg daily and Lipitor  80 mg daily.  Acute kidney injury on CKD stage IIIa/IIIb -Creatinine 1.67.  Baseline creatinine around 1.3-1.4.  Prerenal acute kidney injury in the setting of CHF exacerbation. -Currently treating for CHF.  Holding Farxiga  and spironolactone  in setting of hypotension and AKI. -Continue to monitor renal function, avoid nephrotoxic agents.  Monitor urine output.  Hypokalemia -Potassium 3.4.  Replating with oral KCl 40 mEq.  Peroxisomal atrial fibrillation -Continue Eliquis  2.5 mg twice daily, digoxin  0.125 mg daily and ivabradine  7.5 mg twice daily.   History of renal infarction 2022 -  Continue Eliquis .  Patient has a living will which he states no life-prolonging artificial measure accepts tube feeding.  However during my conversation with patient at bedside patient reported she wants chest compressions if needed but no intubation, only BiPAP or CPAP.  I have failed to explain patient multiple times there is no benefit of chest compression to retrieve life without any intubation and mechanical ventilation.  Given patient is very adamant for no intubation/mechanical ventilation but only chest compression so I am keeping patient FULL code as of and consulted Palliative team to discuss further goal of care in the context of advanced  COPD and CHF.   DVT prophylaxis:  Eliquis  Code Status:  Full Code Diet: Heart healthy diet fluid restriction 2 L/day and salt restriction 2 g/day. Family Communication: None present Disposition Plan: Pending improvement of respiratory distress. Consults: Heart failure team Admission status:   Inpatient, progressive unit Severity of Illness: The  appropriate patient status for this patient is INPATIENT. Inpatient status is judged to be reasonable and necessary in order to provide the required intensity of service to ensure the patient's safety. The patient's presenting symptoms, physical exam findings, and initial radiographic and laboratory data in the context of their chronic comorbidities is felt to place them at high risk for further clinical deterioration. Furthermore, it is not anticipated that the patient will be medically stable for discharge from the hospital within 2 midnights of admission.   * I certify that at the point of admission it is my clinical judgment that the patient will require inpatient hospital care spanning beyond 2 midnights from the point of admission due to high intensity of service, high risk for further deterioration and high frequency of surveillance required.DEWAINE    Carsten Carstarphen, MD [facility]  How to contact the [facility] Attending or Consulting provider 7A - 7P or covering provider during after hours 7P -7A, for this patient.  Check the care team in [facility] and look for a) attending/consulting [facility] provider listed and b) the [facility] team listed Log into [email or web address] and use [place]'s universal password to access. If you do not have the password, please contact the hospital operator. Locate the [facility] provider you are looking for under [facility] and page to a number that you can be directly reached. If you still have difficulty reaching the provider, please page the [facility] (Director on Call) for the Hospitalists listed on amion for assistance.  12/15/2023, 1:50 AM

## 2023-12-15 NOTE — Plan of Care (Signed)
  Problem: Education: Goal: Knowledge of General Education information will improve Description: Including pain rating scale, medication(s)/side effects and non-pharmacologic comfort measures Outcome: Progressing   Problem: Health Behavior/Discharge Planning: Goal: Ability to manage health-related needs will improve Outcome: Progressing   Problem: Clinical Measurements: Goal: Ability to maintain clinical measurements within normal limits will improve Outcome: Progressing Goal: Will remain free from infection Outcome: Progressing Goal: Diagnostic test results will improve Outcome: Progressing Goal: Respiratory complications will improve Outcome: Progressing Goal: Cardiovascular complication will be avoided Outcome: Progressing   Problem: Activity: Goal: Risk for activity intolerance will decrease Outcome: Progressing   Problem: Nutrition: Goal: Adequate nutrition will be maintained Outcome: Progressing   Problem: Coping: Goal: Level of anxiety will decrease Outcome: Progressing   Problem: Elimination: Goal: Will not experience complications related to bowel motility Outcome: Progressing Goal: Will not experience complications related to urinary retention Outcome: Progressing   Problem: Pain Management: Goal: General experience of comfort will improve Outcome: Progressing   Problem: Safety: Goal: Ability to remain free from injury will improve Outcome: Progressing   Problem: Skin Integrity: Goal: Risk for impaired skin integrity will decrease Outcome: Progressing   Problem: Education: Goal: Ability to demonstrate management of disease process will improve Outcome: Progressing Goal: Ability to verbalize understanding of medication therapies will improve Outcome: Progressing Goal: Individualized Educational Video(s) Outcome: Progressing   Problem: Activity: Goal: Capacity to carry out activities will improve Outcome: Progressing   Problem: Cardiac: Goal:  Ability to achieve and maintain adequate cardiopulmonary perfusion will improve Outcome: Progressing

## 2023-12-15 NOTE — Progress Notes (Deleted)
 Patient's MAP 64.  Asymptomatic hypotensive.  Earlier in the evening patient received Lasix  120 mg in the ED.  Since then blood pressure has been progressively trending down.  Patient also has chronic systolic heart failure EF 20 to 25% status post ICD in place.  Given blood pressure is soft even though midodrine  5 mg giving another dose of midodrine  5 mg and albumin  50 g stat.  Brittany Bumgarner, MD [facility] 12/15/2023, 6:11 AM

## 2023-12-15 NOTE — Progress Notes (Signed)
 Placed patient on bipap with IPAP set at 12cm and E{A{ set at 6cm. Oxygen set at 2lpm

## 2023-12-16 DIAGNOSIS — J9601 Acute respiratory failure with hypoxia: Secondary | ICD-10-CM

## 2023-12-16 DIAGNOSIS — I5041 Acute combined systolic (congestive) and diastolic (congestive) heart failure: Secondary | ICD-10-CM | POA: Diagnosis not present

## 2023-12-16 DIAGNOSIS — N189 Chronic kidney disease, unspecified: Secondary | ICD-10-CM

## 2023-12-16 DIAGNOSIS — J441 Chronic obstructive pulmonary disease with (acute) exacerbation: Secondary | ICD-10-CM

## 2023-12-16 DIAGNOSIS — I10 Essential (primary) hypertension: Secondary | ICD-10-CM

## 2023-12-16 DIAGNOSIS — N179 Acute kidney failure, unspecified: Secondary | ICD-10-CM

## 2023-12-16 LAB — CBC
HCT: 41.2 % (ref 36.0–46.0)
Hemoglobin: 13.7 g/dL (ref 12.0–15.0)
MCH: 29.3 pg (ref 26.0–34.0)
MCHC: 33.3 g/dL (ref 30.0–36.0)
MCV: 88.2 fL (ref 80.0–100.0)
Platelets: 188 10*3/uL (ref 150–400)
RBC: 4.67 MIL/uL (ref 3.87–5.11)
RDW: 17.6 % — ABNORMAL HIGH (ref 11.5–15.5)
WBC: 14.3 10*3/uL — ABNORMAL HIGH (ref 4.0–10.5)
nRBC: 0 % (ref 0.0–0.2)

## 2023-12-16 LAB — BASIC METABOLIC PANEL
Anion gap: 17 — ABNORMAL HIGH (ref 5–15)
BUN: 51 mg/dL — ABNORMAL HIGH (ref 8–23)
CO2: 24 mmol/L (ref 22–32)
Calcium: 10.1 mg/dL (ref 8.9–10.3)
Chloride: 93 mmol/L — ABNORMAL LOW (ref 98–111)
Creatinine, Ser: 2.3 mg/dL — ABNORMAL HIGH (ref 0.44–1.00)
GFR, Estimated: 23 mL/min — ABNORMAL LOW (ref >60)
Glucose, Bld: 209 mg/dL — ABNORMAL HIGH (ref 70–99)
Potassium: 3.7 mmol/L (ref 3.5–5.1)
Sodium: 134 mmol/L — ABNORMAL LOW (ref 135–145)

## 2023-12-16 MED ORDER — METHYLPREDNISOLONE SODIUM SUCC 40 MG IJ SOLR
40.0000 mg | Freq: Two times a day (BID) | INTRAMUSCULAR | Status: AC
  Administered 2023-12-16 – 2023-12-18 (×4): 40 mg via INTRAVENOUS
  Filled 2023-12-16 (×4): qty 1

## 2023-12-16 MED ORDER — MIDODRINE HCL 5 MG PO TABS
10.0000 mg | ORAL_TABLET | Freq: Three times a day (TID) | ORAL | Status: DC
  Administered 2023-12-16 – 2023-12-18 (×7): 10 mg via ORAL
  Filled 2023-12-16 (×8): qty 2

## 2023-12-16 NOTE — Progress Notes (Signed)
 Placed patient on BIPAP for the night with oxygen set at 3lpm. Sp02=96% at this time

## 2023-12-16 NOTE — Progress Notes (Addendum)
 PROGRESS NOTE        PATIENT DETAILS Name: Brittany [name]  Age: 63 y.o. Sex: female Date of Birth: Aug 12, 1961 Admit Date: 12/14/2023 Admitting Physician Micaela Speaker, MD ERE:Tpodnw, Raguel, MD  Brief Summary: Patient is a 63 y.o.  female with history of COPD on home O2, HFrEF, AICD implantation, PAF, CKD stage IIIb-presented with worsening shortness of breath-found to have acute on chronic hypoxic/hypercarbic respiratory failure in the setting of COPD/HFrEF exacerbation-due to RSV/coronavirus infection.  Significant events: 1/3>> admit to [facility]-acute on chronic hypoxic respiratory failure-CHF/COPD exacerbation-+ RSV/coronavirus +ve on RVP  Significant studies: 1/3>> CXR: No PNA 1/4>> Limited echo: EF <20%, RV systolic function moderate to severely reduced.  Significant microbiology data: 1/3>> COVID/influenza/RSV PCR: Negative 1/4>> respiratory virus panel:+ve for coronavirus OC43, RSV 1/4>> blood cultures: No growth  Procedures: None  Consults: None  Subjective: Feels better-still having coughing spells.  Lying flat in bed  Objective: Vitals: Blood pressure 124/76, pulse 83, temperature 97.6 F (36.4 C), temperature source Oral, resp. rate 19, height 4' 11 (1.499 m), weight 44.9 kg, SpO2 100%.   Exam: Gen Exam:Alert awake-not in any distress-lying flat HEENT:atraumatic, normocephalic + JVD Chest: B/L clear to auscultation anteriorly CVS:S1S2 regular Abdomen:soft non tender, non distended Extremities:no edema Neurology: Non focal Skin: no rash  Pertinent Labs/Radiology:    Latest Ref Rng & Units 12/16/2023    5:43 AM 12/15/2023    2:08 AM 12/14/2023   10:58 PM  CBC  WBC 4.0 - 10.5 K/uL 14.3  15.1    Hemoglobin 12.0 - 15.0 g/dL 86.2  85.8  83.6   Hematocrit 36.0 - 46.0 % 41.2  43.9  48.0   Platelets 150 - 400 K/uL 188  161      Lab Results  Component Value Date   NA 134 (L) 12/16/2023   K 3.7 12/16/2023   CL 93 (L) 12/16/2023    CO2 24 12/16/2023     Assessment/Plan: Acute on chronic hypoxic/hypercarbic respiratory failure (on home O2-2-3 L) Secondary to COPD/CHF exacerbation On initial presentation-required BiPAP-but clinically improved with diuretics/bronchodilators-on 4 L of oxygen this morning. Continue to treat underlying CHF/COPD exacerbation-see below.  COPD exacerbation secondary to coronavirus/RSV infection Improved-hardly any wheezing today Taper steroids Continue bronchodilators As needed antitussives  Acute on chronic HFrEF AICD in place Seems to have diuresed briskly over the past 24 hours-volume status is stable-no leg edema-JVD slightly elevated Given worsening renal function-hold diuretics today-Farxiga  remains on hold. Remains on digoxin /Corlanor  Repeat electrolytes tomorrow-if creatinine worsening-will touch base with advanced heart failure team.  Team.  AKI on CKD stage IIIb AKI hemodynamically mediated-possibly related to low flow state/cardiorenal syndrome Since volume status seems to have improved-holding diuretics Increasing midodrine  to 10 mg 3 times daily-as BP soft this morning Avoid nephrotoxic agents Reassess 1/6-see above regarding plans to touch base with advanced heart failure team if renal function continues to worsen.  PAF Telemetry monitoring Digoxin -check level with a.m. 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   Weight as of  this encounter: 44.9 kg.   Code status:   Code Status: Limited: Do not attempt resuscitation (DNR) -DNR-LIMITED -Do Not Intubate/DNI    DVT Prophylaxis: apixaban  (ELIQUIS ) tablet 2.5 mg Start: 12/15/23 0100 SCDs Start: 12/15/23 0059 Place TED hose Start: 12/15/23 0059 apixaban  (ELIQUIS ) tablet 2.5 mg    Family Communication: None at bedside   Disposition Plan: Status is: Inpatient Remains inpatient appropriate because: Severity of illness   Planned Discharge Destination:Home health   Diet: Diet Order             Diet Heart Room service appropriate? Yes; Fluid consistency: Thin; Fluid restriction: 2000 mL Fluid  Diet effective now                     Antimicrobial agents: Anti-infectives (From admission, onward)    Start     Dose/Rate Route Frequency Ordered Stop   12/16/23 0000  cefTRIAXone  (ROCEPHIN ) 2 g in sodium chloride  0.9 % 100 mL IVPB  Status:  Discontinued        2 g 200 mL/hr over 30 Minutes Intravenous Every 24 hours 12/15/23 0058 12/15/23 2117   12/15/23 0200  doxycycline  (VIBRAMYCIN ) 100 mg in dextrose  5 % 250 mL IVPB  Status:  Discontinued        100 mg 125 mL/hr over 120 Minutes Intravenous Every 12 hours 12/15/23 0105 12/15/23 1038   12/15/23 0145  cefTRIAXone  (ROCEPHIN ) 2 g in sodium chloride  0.9 % 100 mL IVPB        2 g 200 mL/hr over 30 Minutes Intravenous  Once 12/15/23 0135 12/15/23 0259   12/15/23 0100  azithromycin  (ZITHROMAX ) 500 mg in sodium chloride  0.9 % 250 mL IVPB  Status:  Discontinued        500 mg 250 mL/hr over 60 Minutes Intravenous Every 24 hours 12/15/23 0058 12/15/23 0104        MEDICATIONS: Scheduled Meds:  acetylcysteine   3 mL Nebulization TID   apixaban   2.5 mg Oral BID   atorvastatin   80 mg Oral Daily   digoxin   125 mcg Oral Daily   feeding supplement  237 mL Oral Daily   guaiFENesin -dextromethorphan   5 mL Oral Q6H   ipratropium-albuterol   3 mL Nebulization Q6H   ivabradine   7.5 mg Oral BID WC    methylPREDNISolone  (SOLU-MEDROL ) injection  40 mg Intravenous Q12H   midodrine   10 mg Oral TID WC   pantoprazole   40 mg Oral Daily   potassium chloride   40 mEq Oral Daily   Continuous Infusions: PRN Meds:.acetaminophen  **OR** acetaminophen , cyclobenzaprine , levalbuterol , senna-docusate   I have personally reviewed following labs and imaging studies  LABORATORY DATA: CBC: Recent Labs  Lab 12/14/23 2130 12/14/23 2258 12/15/23 0208 12/16/23 0543  WBC 13.3*  --  15.1* 14.3*  NEUTROABS 10.4*  --   --   --   HGB 15.4* 16.3* 14.1 13.7  HCT 49.5* 48.0* 43.9 41.2  MCV 94.5  --  91.6 88.2  PLT 247  --  161 188    Basic Metabolic Panel: Recent Labs  Lab 12/10/23 1037 12/14/23 2130 12/14/23 2258 12/15/23 0208 12/16/23 0543  NA 137 139 135 136 134*  K 3.5 3.4* 5.7* 3.0* 3.7  CL 101 99  --  97* 93*  CO2 24 20*  --  23 24  GLUCOSE 153* 263*  --  237* 209*  BUN 17 20  --  23 51*  CREATININE 1.20* 1.67*  --  1.74* 2.30*  CALCIUM  10.1 10.1  --  9.5 10.1    GFR: Estimated Creatinine Clearance: 17.1 mL/min (A) (by C-G formula based on SCr of 2.3 mg/dL (H)).  Liver Function Tests: Recent Labs  Lab 12/14/23 2130 12/15/23 0208  AST 33 30  ALT 17 18  ALKPHOS 102 93  BILITOT 1.3* 1.0  PROT 8.2* 7.7  ALBUMIN  4.3 3.9   No results for input(s): LIPASE, AMYLASE in the last 168 hours. No results for input(s): AMMONIA in the last 168 hours.  Coagulation Profile: No results for input(s): INR, PROTIME in the last 168 hours.  Cardiac Enzymes: No results for input(s): CKTOTAL, CKMB, CKMBINDEX, TROPONINI in the last 168 hours.  BNP (last 3 results) No results for input(s): PROBNP in the last 8760 hours.  Lipid Profile: No results for input(s): CHOL, HDL, LDLCALC, TRIG, CHOLHDL, LDLDIRECT in the last 72 hours.  Thyroid  Function Tests: No results for input(s): TSH, T4TOTAL, FREET4, T3FREE, THYROIDAB in the last 72 hours.  Anemia  Panel: No results for input(s): VITAMINB12, FOLATE, FERRITIN, TIBC, IRON, RETICCTPCT in the last 72 hours.  Urine analysis:    Component Value Date/Time   COLORURINE YELLOW 05/23/2023 1519   APPEARANCEUR CLEAR 05/23/2023 1519   LABSPEC 1.013 05/23/2023 1519   PHURINE 6.0 05/23/2023 1519   GLUCOSEU NEGATIVE 05/23/2023 1519   HGBUR NEGATIVE 05/23/2023 1519   BILIRUBINUR NEGATIVE 05/23/2023 1519   KETONESUR NEGATIVE 05/23/2023 1519   PROTEINUR NEGATIVE 05/23/2023 1519   UROBILINOGEN 1.0 03/15/2015 1117   NITRITE NEGATIVE 05/23/2023 1519   LEUKOCYTESUR NEGATIVE 05/23/2023 1519    Sepsis Labs: Lactic Acid, Venous    Component Value Date/Time   LATICACIDVEN 1.3 08/04/2023 1111    MICROBIOLOGY: Recent Results (from the past 240 hours)  Resp panel by RT-PCR (RSV, Flu A&B, Covid) Anterior Nasal Swab     Status: None   Collection Time: 12/14/23  9:31 PM   Specimen: Anterior Nasal Swab  Result Value Ref Range Status   SARS Coronavirus 2 by RT PCR NEGATIVE NEGATIVE Final   Influenza A by PCR NEGATIVE NEGATIVE Final   Influenza B by PCR NEGATIVE NEGATIVE Final    Comment: (NOTE) The Xpert Xpress SARS-CoV-2/FLU/RSV plus assay is intended as an aid in the diagnosis of influenza from Nasopharyngeal swab specimens and should not be used as a sole basis for treatment. Nasal washings and aspirates are unacceptable for Xpert Xpress SARS-CoV-2/FLU/RSV testing.  Fact Sheet for Patients: bloggercourse.com  Fact Sheet for Healthcare Providers: seriousbroker.it  This test is not yet approved or cleared by the [place]  FDA and has been authorized for detection and/or diagnosis of SARS-CoV-2 by FDA under an Emergency Use Authorization (EUA). This EUA will remain in effect (meaning this test can be used) for the duration of the COVID-19 declaration under Section 564(b)(1) of the Act, 21 U.S.C. section 360bbb-3(b)(1), unless  the authorization is terminated or revoked.     Resp Syncytial Virus by PCR NEGATIVE NEGATIVE Final    Comment: (NOTE) Fact Sheet for Patients: bloggercourse.com  Fact Sheet for Healthcare Providers: seriousbroker.it  This test is not yet approved or cleared by the [place]  FDA and has been authorized for detection and/or diagnosis of SARS-CoV-2 by FDA under an Emergency Use Authorization (EUA). This EUA will remain in effect (meaning this test can be used) for the duration of the COVID-19 declaration under Section 564(b)(1) of  the Act, 21 U.S.C. section 360bbb-3(b)(1), unless the authorization is terminated or revoked.  Performed at [facility], [address], [place], [place] [address]   Respiratory (~20 pathogens) panel by PCR     Status: Abnormal   Collection Time: 12/15/23  1:00 AM   Specimen: Nasopharyngeal Swab; Respiratory  Result Value Ref Range Status   Adenovirus NOT DETECTED NOT DETECTED Final   Coronavirus 229E NOT DETECTED NOT DETECTED Final    Comment: (NOTE) The Coronavirus on the Respiratory Panel, DOES NOT test for the novel  Coronavirus (2019 nCoV)    Coronavirus HKU1 NOT DETECTED NOT DETECTED Final   Coronavirus NL63 NOT DETECTED NOT DETECTED Final   Coronavirus OC43 DETECTED (A) NOT DETECTED Final   Metapneumovirus NOT DETECTED NOT DETECTED Final   Rhinovirus / Enterovirus NOT DETECTED NOT DETECTED Final   Influenza A NOT DETECTED NOT DETECTED Final   Influenza B NOT DETECTED NOT DETECTED Final   Parainfluenza Virus 1 NOT DETECTED NOT DETECTED Final   Parainfluenza Virus 2 NOT DETECTED NOT DETECTED Final   Parainfluenza Virus 3 NOT DETECTED NOT DETECTED Final   Parainfluenza Virus 4 NOT DETECTED NOT DETECTED Final   Respiratory Syncytial Virus DETECTED (A) NOT DETECTED Final   Bordetella pertussis NOT DETECTED NOT DETECTED Final   Bordetella Parapertussis NOT DETECTED NOT DETECTED Final    Chlamydophila pneumoniae NOT DETECTED NOT DETECTED Final   Mycoplasma pneumoniae NOT DETECTED NOT DETECTED Final    Comment: Performed at [facility], [address], [place], [place] [address]  Culture, blood (Routine X 2) w Reflex to ID Panel     Status: None (Preliminary result)   Collection Time: 12/15/23  1:05 AM   Specimen: BLOOD LEFT ARM  Result Value Ref Range Status   Specimen Description BLOOD LEFT ARM  Final   Special Requests   Final    BOTTLES DRAWN AEROBIC ONLY Blood Culture results may not be optimal due to an inadequate volume of blood received in culture bottles   Culture   Final    NO GROWTH 1 DAY Performed at [facility], [address], [place], [place] [address]    Report Status PENDING  Incomplete  Culture, blood (Routine X 2) w Reflex to ID Panel     Status: None (Preliminary result)   Collection Time: 12/15/23  2:07 AM   Specimen: BLOOD RIGHT ARM  Result Value Ref Range Status   Specimen Description BLOOD RIGHT ARM  Final   Special Requests   Final    BOTTLES DRAWN AEROBIC AND ANAEROBIC Blood Culture adequate volume   Culture   Final    NO GROWTH 1 DAY Performed at [facility], [address] [place], [place] [address]    Report Status PENDING  Incomplete    RADIOLOGY STUDIES/RESULTS: ECHOCARDIOGRAM LIMITED Result Date: 12/15/2023    ECHOCARDIOGRAM LIMITED REPORT   Patient Name:   Brittany [name]  Date of Exam: 12/15/2023 Medical Rec #:  [id number]               Height:       59.0 in Accession #:    [id number]              Weight:       96.1 lb Date of Birth:  09/24/1961               BSA:          1.351 m Patient Age:  62 years                BP:           99/57 mmHg Patient Gender: F                       HR:           79 bpm. Exam Location:  Inpatient Procedure: Limited Echo, Limited Color Doppler and Intracardiac Opacification            Agent Indications:    CHF- Acute Diastolic I50.31, CHF- Acute Systolic I50.21  History:         Patient has prior history of Echocardiogram examinations, most                 recent 09/26/2023. CHF and Cardiomyopathy, Previous Myocardial                 Infarction, Angina and CAD, Defibrillator, Pacemaker and Prior                 CABG, COPD and CKD, stage 3, Arrythmias:Atrial Fibrillation,                 Signs/Symptoms:Hypotension and Chest Pain; Risk                 Factors:Hypertension and Diabetes.  Sonographer:    Thea Norlander RCS Referring Phys: SUBRINA SUNDIL IMPRESSIONS  1. Left ventricular ejection fraction, by estimation, is <20%. The left ventricle has severely decreased function. The left ventricle demonstrates global hypokinesis. The left ventricular internal cavity size was dilated. Left ventricular diastolic parameters are consistent with Grade II diastolic dysfunction (pseudonormalization).  2. Right ventricular systolic function moderate to severely reduced. The right ventricular size is normal.  3. Left atrial size was grossly normal.  4. Right atrial size was grossly normal.  5. The mitral valve is grossly normal. Mild mitral valve regurgitation. No evidence of mitral stenosis.  6. The aortic valve was not well visualized. Aortic valve regurgitation is moderate. Aortic valve sclerosis is present, with no evidence of aortic valve stenosis.  7. The inferior vena cava is normal in size with greater than 50% respiratory variability, suggesting right atrial pressure of 3 mmHg. Comparison(s): A prior study was performed on 09/26/2023. LVEF 20-25%, global hypokinesis, moderately dilated ventricle, grade 2 diastolic dysfunction, RVSP 36 mmHg, see report for additional details. Conclusion(s)/Recommendation(s): No left ventricular mural or apical thrombus/thrombi. FINDINGS  Left Ventricle: Left ventricular ejection fraction, by estimation, is <20%. The left ventricle has severely decreased function. The left ventricle demonstrates global hypokinesis. Definity  contrast agent was given IV to  delineate the left ventricular endocardial borders. The left ventricular internal cavity size was dilated. There is no left ventricular hypertrophy. Left ventricular diastolic parameters are consistent with Grade II diastolic dysfunction (pseudonormalization). Right Ventricle: The right ventricular size is normal. Right ventricular systolic function moderate to severely reduced. Left Atrium: Left atrial size was grossly normal. Right Atrium: Right atrial size was grossly normal. Mitral Valve: The mitral valve is grossly normal. Mild mitral valve regurgitation. No evidence of mitral valve stenosis. Tricuspid Valve: The tricuspid valve is normal in structure. Tricuspid valve regurgitation is mild . No evidence of tricuspid stenosis. Aortic Valve: The aortic valve was not well visualized. Aortic valve regurgitation is moderate. Aortic regurgitation PHT measures 265 msec. Aortic valve sclerosis is present, with no evidence of aortic valve stenosis. Aortic valve peak gradient measures 16.3 mmHg. Pulmonic Valve: The  pulmonic valve was not well visualized. Pulmonic valve regurgitation is not visualized. No evidence of pulmonic stenosis. Aorta: The aortic root and ascending aorta are structurally normal, with no evidence of dilitation. Venous: The inferior vena cava is normal in size with greater than 50% respiratory variability, suggesting right atrial pressure of 3 mmHg. IAS/Shunts: There is right bowing of the interatrial septum, suggestive of elevated left atrial pressure. Additional Comments: A device lead is visualized.  LEFT VENTRICLE PLAX 2D LVIDd:         6.00 cm   Diastology LVIDs:         5.40 cm   LV e' medial:    5.77 cm/s LV PW:         0.90 cm   LV E/e' medial:  9.8 LV IVS:        0.40 cm   LV e' lateral:   11.20 cm/s LVOT diam:     1.70 cm   LV E/e' lateral: 5.1 LVOT Area:     2.27 cm  RIGHT VENTRICLE            IVC RV S prime:     7.62 cm/s  IVC diam: 1.10 cm TAPSE (M-mode): 1.1 cm LEFT ATRIUM          Index LA diam:    3.80 cm 2.81 cm/m  AORTIC VALVE AV Vmax:      201.67 cm/s AV Peak Grad: 16.3 mmHg AI PHT:       265 msec  AORTA Ao Root diam: 2.90 cm Ao Asc diam:  2.40 cm MITRAL VALVE MV Area (PHT): 4.54 cm    SHUNTS MV Decel Time: 167 msec    Systemic Diam: 1.70 cm MR Peak grad: 56.9 mmHg MR Vmax:      377.00 cm/s MV E velocity: 56.60 cm/s MV A velocity: 29.00 cm/s MV E/A ratio:  1.95 Sunit Tolia Electronically signed by Madonna Large Signature Date/Time: 12/15/2023/5:06:25 PM    Final    DG Chest Portable 1 View Result Date: 12/14/2023 CLINICAL DATA:  09/25/2023 EXAM: PORTABLE CHEST 1 VIEW COMPARISON:  09/25/2023 FINDINGS: Cardiac shadow is stable. Defibrillator and postsurgical changes are again noted. Stimulator pack is noted in the right chest. Lungs are hyperinflated but clear. No bony abnormality is seen. IMPRESSION: No acute abnormality noted. Electronically Signed   By: Oneil Devonshire M.D.   On: 12/14/2023 22:56     LOS: 1 day   Donalda Applebaum, MD  [facility]    To contact the attending provider between 7A-7P or the covering provider during after hours 7P-7A, please log into the web site [email or web address] and access using universal [place] password for that web site. If you do not have the password, please call the hospital operator.  12/16/2023, 9:14 AM

## 2023-12-16 NOTE — Progress Notes (Signed)
 Palliative Medicine Progress Note   Patient Name: Brittany [name]        Date: 12/16/2023 DOB: July 04, 1961  Age: 63 y.o. MRN#: [id number] Attending Physician: Raenelle Donalda HERO, MD Primary Care Physician: Tanda Bleacher, MD Admit Date: 12/14/2023   HPI/Patient Profile: 63 y.o. female  with past medical history of chronic HFrEF (EF 25%), AICD, COPD with chronic respiratory failure on 2 L home O2, paroxysmal atrial fibrillation, and history of small cell lung cancer s/p chemo and XRT in 2015. She presented to the ED on 12/14/2023 with respiratory distress, and is admitted with acute on chronic CHF, COPD exacerbation, and AKI.   Palliative Medicine was consulted for goals of care discussions.    Subjective: Chart reviewed. Note respiratory virus panel positive for covid OC43 and RSV. Echo yesterday showed EF less than 20%.  Bedside visit. Brittany [name] reports feeling overwhelmed earlier today when she found out she was positive for 2 different respiratory viruses and that her heart is down to 19%. She also understands that the lasix  is being held due to decreased kidney function.   Emotional support provided via therapeutic listening and story-telling. Questions/concerns addressed. Brittany [name] expresses appreciation for the visit and ongoing palliative support.    Objective:  Physical Exam Vitals reviewed.  Constitutional:      General: She is not in acute distress.    Comments: Chronically ill-appearing  Pulmonary:     Effort: Pulmonary effort is normal.  Neurological:     Mental Status: She is alert and oriented to person, place, and time.  Psychiatric:     Comments: pleasant            Palliative Medicine Assessment & Plan   Assessment: Principal Problem:   Respiratory  distress Active Problems:   Essential hypertension   Acute combined systolic and diastolic CHF, NYHA class 4 (HCC)   Acute hypoxic respiratory failure (HCC)   COPD with acute exacerbation (HCC)   Acute kidney injury superimposed on chronic kidney disease (HCC)   History of CAD (coronary artery disease)   Hx of hypokalemia    Recommendations/Plan: Confirmed DNR/DNI status Continue current supportive interventions Goal of care is medical stabilization and to return home PMT will continue to follow  Code Status: DNR/DNI  Prognosis:  Unable to determine  Discharge Planning: To Be Determined   Thank you  for allowing the Palliative Medicine Team to assist in the care of this patient.   Time: 30 minutes   Recardo KATHEE Loll, NP   Please contact Palliative Medicine Team phone at [phone] for questions and concerns.  For individual providers, please see AMION.

## 2023-12-16 NOTE — Plan of Care (Signed)
  Problem: Education: Goal: Knowledge of General Education information will improve Description: Including pain rating scale, medication(s)/side effects and non-pharmacologic comfort measures Outcome: Progressing   Problem: Health Behavior/Discharge Planning: Goal: Ability to manage health-related needs will improve Outcome: Progressing   Problem: Clinical Measurements: Goal: Ability to maintain clinical measurements within normal limits will improve Outcome: Progressing Goal: Will remain free from infection Outcome: Progressing Goal: Diagnostic test results will improve Outcome: Progressing Goal: Respiratory complications will improve Outcome: Progressing Goal: Cardiovascular complication will be avoided Outcome: Progressing   Problem: Activity: Goal: Risk for activity intolerance will decrease Outcome: Progressing   Problem: Nutrition: Goal: Adequate nutrition will be maintained Outcome: Progressing   Problem: Coping: Goal: Level of anxiety will decrease Outcome: Progressing   Problem: Elimination: Goal: Will not experience complications related to bowel motility Outcome: Progressing Goal: Will not experience complications related to urinary retention Outcome: Progressing   Problem: Pain Management: Goal: General experience of comfort will improve Outcome: Progressing   Problem: Safety: Goal: Ability to remain free from injury will improve Outcome: Progressing   Problem: Skin Integrity: Goal: Risk for impaired skin integrity will decrease Outcome: Progressing   Problem: Education: Goal: Ability to demonstrate management of disease process will improve Outcome: Progressing Goal: Ability to verbalize understanding of medication therapies will improve Outcome: Progressing Goal: Individualized Educational Video(s) Outcome: Progressing   Problem: Activity: Goal: Capacity to carry out activities will improve Outcome: Progressing   Problem: Cardiac: Goal:  Ability to achieve and maintain adequate cardiopulmonary perfusion will improve Outcome: Progressing

## 2023-12-17 DIAGNOSIS — J129 Viral pneumonia, unspecified: Secondary | ICD-10-CM | POA: Diagnosis not present

## 2023-12-17 DIAGNOSIS — J441 Chronic obstructive pulmonary disease with (acute) exacerbation: Secondary | ICD-10-CM | POA: Diagnosis not present

## 2023-12-17 DIAGNOSIS — I10 Essential (primary) hypertension: Secondary | ICD-10-CM | POA: Diagnosis not present

## 2023-12-17 DIAGNOSIS — I5041 Acute combined systolic (congestive) and diastolic (congestive) heart failure: Secondary | ICD-10-CM | POA: Diagnosis not present

## 2023-12-17 DIAGNOSIS — J9601 Acute respiratory failure with hypoxia: Secondary | ICD-10-CM | POA: Diagnosis not present

## 2023-12-17 DIAGNOSIS — N179 Acute kidney failure, unspecified: Secondary | ICD-10-CM | POA: Diagnosis not present

## 2023-12-17 DIAGNOSIS — N189 Chronic kidney disease, unspecified: Secondary | ICD-10-CM | POA: Diagnosis not present

## 2023-12-17 LAB — BASIC METABOLIC PANEL
Anion gap: 13 (ref 5–15)
BUN: 59 mg/dL — ABNORMAL HIGH (ref 8–23)
CO2: 25 mmol/L (ref 22–32)
Calcium: 10.5 mg/dL — ABNORMAL HIGH (ref 8.9–10.3)
Chloride: 95 mmol/L — ABNORMAL LOW (ref 98–111)
Creatinine, Ser: 1.74 mg/dL — ABNORMAL HIGH (ref 0.44–1.00)
GFR, Estimated: 33 mL/min — ABNORMAL LOW (ref >60)
Glucose, Bld: 257 mg/dL — ABNORMAL HIGH (ref 70–99)
Potassium: 4.2 mmol/L (ref 3.5–5.1)
Sodium: 133 mmol/L — ABNORMAL LOW (ref 135–145)

## 2023-12-17 LAB — DIGOXIN LEVEL: Digoxin Level: 0.6 ng/mL — ABNORMAL LOW (ref 0.8–2.0)

## 2023-12-17 MED ORDER — ADULT MULTIVITAMIN W/MINERALS CH
1.0000 | ORAL_TABLET | Freq: Every day | ORAL | Status: DC
  Administered 2023-12-17 – 2023-12-20 (×4): 1 via ORAL
  Filled 2023-12-17 (×7): qty 1

## 2023-12-17 MED ORDER — TORSEMIDE 20 MG PO TABS
80.0000 mg | ORAL_TABLET | Freq: Every day | ORAL | Status: DC
  Administered 2023-12-17: 80 mg via ORAL
  Filled 2023-12-17: qty 4

## 2023-12-17 MED ORDER — DAPAGLIFLOZIN PROPANEDIOL 10 MG PO TABS: 10.0000 mg | ORAL_TABLET | Freq: Every day | ORAL | Status: DC

## 2023-12-17 MED ORDER — ENSURE ENLIVE PO LIQD
237.0000 mL | Freq: Three times a day (TID) | ORAL | Status: DC
  Administered 2023-12-17 – 2023-12-22 (×14): 237 mL via ORAL

## 2023-12-17 MED ORDER — ALPRAZOLAM 0.25 MG PO TABS
0.2500 mg | ORAL_TABLET | Freq: Three times a day (TID) | ORAL | Status: DC | PRN
  Administered 2023-12-17 – 2023-12-22 (×3): 0.25 mg via ORAL
  Filled 2023-12-17 (×4): qty 1

## 2023-12-17 MED ORDER — DAPAGLIFLOZIN PROPANEDIOL 10 MG PO TABS
10.0000 mg | ORAL_TABLET | Freq: Every day | ORAL | Status: DC
  Administered 2023-12-17 – 2023-12-22 (×6): 10 mg via ORAL
  Filled 2023-12-17 (×6): qty 1

## 2023-12-17 MED ORDER — BUDESONIDE 0.25 MG/2ML IN SUSP
0.2500 mg | Freq: Two times a day (BID) | RESPIRATORY_TRACT | Status: DC
  Administered 2023-12-17 – 2023-12-22 (×11): 0.25 mg via RESPIRATORY_TRACT
  Filled 2023-12-17 (×11): qty 2

## 2023-12-17 MED ORDER — TORSEMIDE 20 MG PO TABS
80.0000 mg | ORAL_TABLET | Freq: Two times a day (BID) | ORAL | Status: DC
  Administered 2023-12-18 – 2023-12-19 (×2): 80 mg via ORAL
  Filled 2023-12-17 (×3): qty 4

## 2023-12-17 MED ORDER — FUROSEMIDE 10 MG/ML IJ SOLN
80.0000 mg | Freq: Once | INTRAMUSCULAR | Status: AC
  Administered 2023-12-17: 80 mg via INTRAVENOUS
  Filled 2023-12-17: qty 8

## 2023-12-17 MED ORDER — SPIRONOLACTONE 12.5 MG HALF TABLET: 12.5000 mg | ORAL_TABLET | Freq: Every day | ORAL | Status: DC

## 2023-12-17 NOTE — Progress Notes (Signed)
 PT Cancellation Note  Patient Details Name: Brittany [name]  MRN: [id number] DOB: Jan 05, 1961   Cancelled Treatment:    Reason Eval/Treat Not Completed: Patient declined, no reason specified (Pt just placed on [name])   [name] 12/17/2023, 3:11 PM

## 2023-12-17 NOTE — Progress Notes (Signed)
 Heart Failure Navigator Progress Note  Assessed for Heart & Vascular TOC clinic readiness.  Patient does not meet criteria due to Advanced Heart Failure Team patient of Dr. [name]. .   Navigator will sign off at this time.   Rhae Hammock, BSN, Scientist, clinical (histocompatibility and immunogenetics) Only

## 2023-12-17 NOTE — Progress Notes (Signed)
 Initial Nutrition Assessment  DOCUMENTATION CODES:   Severe malnutrition in context of chronic illness  INTERVENTION:  Liberalize diet Increase Ensure Plus High Protein po TID, each supplement provides 350 kcal and 20 grams of protein.  Multivitamin/minerals Provide lactose free milk at breakfast.   NUTRITION DIAGNOSIS:   Severe Malnutrition related to chronic illness as evidenced by meal completion < 25%, severe fat depletion, severe muscle depletion.    GOAL:   Patient will meet greater than or equal to 90% of their needs    MONITOR:   PO intake, Supplement acceptance  REASON FOR ASSESSMENT:   Consult Assessment of nutrition requirement/status  ASSESSMENT:   63 y.o. F, Presented presented from motel to ED via [facility], with complaints of, respiratory distress, lethargic.  Admitting with acute respiratory distress secondary to CHF and COPD. 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 Patient stated that she has had a declined appetite for months. Currently no appetite.  Meal tray on bedside table with ~ 25% of meal consumed.  She stated that she enjoys the ensure and drinks them at home was drinking 3 times day but now 2 a day due to cost.   Educated her on Carnation instant breakfast for a more cost effective source. She stated lactose intolerant and does not like the soy milk. Will provide Lactose free milk. She enjoys Chocolate. She reports no chewing or swallowing concerns.  Missing teeth.  There is no benefit to excessive dietary restrictions related advanced age, increased nutrient needs. Patient would benefit better from liberalized diet to help meet increased needs and promote better oral intake.  Weight history: 12/16/23 44.9 kg  11/21/23 43.6 kg   10/30/23 43.1 kg  10/11/23 43.1 kg  10/09/23 45.2 kg  09/29/23 41.5 kg  08/16/23 43.4 kg  08/06/23 41.4 kg  05/30/23 43.5 kg  05/28/23 44.9 kg    Average Meal Intake: 25-50% per patient report.   Nutritionally Relevant Medications: Scheduled Meds:  digoxin   125 mcg Oral Daily   feeding supplement  237 mL Oral Daily   guaiFENesin -dextromethorphan   5 mL Oral Q6H   potassium chloride   40 mEq Oral Daily   spironolactone   12.5 mg Oral QHS   torsemide   80 mg Oral Daily    Labs Reviewed:  CBG ranges from 209-257 mg/dL over the last 24 hours   NUTRITION - FOCUSED PHYSICAL EXAM:  Flowsheet Row Most Recent Value  Orbital Region Severe depletion  Upper Arm Region Severe depletion  Thoracic and Lumbar Region Severe depletion  Buccal Region Severe depletion  Temple Region Severe depletion  Clavicle Bone Region Severe depletion  Clavicle and Acromion Bone Region Severe depletion  Scapular Bone Region Severe depletion  Dorsal Hand Severe depletion  Patellar Region Severe depletion  Anterior Thigh Region Severe depletion  Posterior Calf Region Severe depletion  Edema (RD Assessment) None  Hair Reviewed  Eyes Reviewed  Mouth Reviewed  Skin Reviewed  Nails Reviewed       Diet Order:   Diet Order             Diet Heart Room service appropriate? Yes; Fluid consistency: Thin; Fluid restriction: 2000 mL Fluid  Diet effective now                   EDUCATION NEEDS:  No education needs have been identified at this time  Skin:  Skin Assessment: Reviewed RN Assessment  Last BM:  PTA  Height:   Ht Readings from Last 1 Encounters:  12/14/23 4' 11 (1.499 m)    Weight:   Wt Readings from Last 1 Encounters:  12/16/23 44.9 kg    Ideal Body Weight:     BMI:  Body mass index is 19.99 kg/m.  Estimated Nutritional Needs:   Kcal:  1400-1600 kcal  Protein:  60-70 g  Fluid:  >/= 1.5L    Jenna Pew RDN, LDN Clinical Dietitian   If unable to reach,  please contact RD Inpatient secure chat group between 8 am-4 pm daily

## 2023-12-17 NOTE — Plan of Care (Signed)
  Problem: Education: Goal: Knowledge of General Education information will improve Description: Including pain rating scale, medication(s)/side effects and non-pharmacologic comfort measures Outcome: Progressing   Problem: Health Behavior/Discharge Planning: Goal: Ability to manage health-related needs will improve Outcome: Progressing   Problem: Clinical Measurements: Goal: Ability to maintain clinical measurements within normal limits will improve Outcome: Progressing Goal: Will remain free from infection Outcome: Progressing Goal: Diagnostic test results will improve Outcome: Progressing Goal: Respiratory complications will improve Outcome: Progressing Goal: Cardiovascular complication will be avoided Outcome: Progressing   Problem: Activity: Goal: Risk for activity intolerance will decrease Outcome: Progressing   Problem: Nutrition: Goal: Adequate nutrition will be maintained Outcome: Progressing   Problem: Coping: Goal: Level of anxiety will decrease Outcome: Progressing   Problem: Elimination: Goal: Will not experience complications related to bowel motility Outcome: Progressing Goal: Will not experience complications related to urinary retention Outcome: Progressing   Problem: Pain Management: Goal: General experience of comfort will improve Outcome: Progressing   Problem: Safety: Goal: Ability to remain free from injury will improve Outcome: Progressing   Problem: Skin Integrity: Goal: Risk for impaired skin integrity will decrease Outcome: Progressing   Problem: Education: Goal: Ability to demonstrate management of disease process will improve Outcome: Progressing Goal: Ability to verbalize understanding of medication therapies will improve Outcome: Progressing Goal: Individualized Educational Video(s) Outcome: Progressing   Problem: Activity: Goal: Capacity to carry out activities will improve Outcome: Progressing   Problem: Cardiac: Goal:  Ability to achieve and maintain adequate cardiopulmonary perfusion will improve Outcome: Progressing

## 2023-12-17 NOTE — Consult Note (Signed)
 Cardiology Consultation   Patient ID: Felice Hope  MRN: [id number]; DOB: 16-Feb-1961  Admit date: 12/14/2023 Date of Consult: 12/17/2023  PCP:  Tanda Bleacher, MD    [facility]Care Providers Cardiologist:  None  Electrophysiologist:  OLE ONEIDA HOLTS, MD  Advanced Heart Failure:  Toribio Fuel, MD  {  Patient Profile:   Cannon Quinton  is a 63 y.o. female with a hx of chronic HFrEF status post ICD and Verastem, CAD status post CABG in 2019, PAF, hypertension, COPD, CKD, renal infarct 2022, small cell lung cancer treated with chemo, previous smoker who is being seen 12/17/2023 for the evaluation of CHF at the request of Dr. Raenelle.  History of Present Illness:   Ms. Chaim  has extensive cardiac history and followed by advanced heart failure since at least 2019 when she presented with NSTEMI underwent emergent cardiac catheterization that showed 100% stenosis in the LAD and 95% stenosed left circumflex requiring emergent CABG in 02/03/2018.  Hospital course was also complicated by cardiogenic shock requiring Impella, pneumonia and A-fib.  EF was around 25 to 30% at that time.  She had another intervention in March 2020 and had DES to ostial ramus into the distal left main.  Since then she has had multiple hospitalizations for CHF and has had persistently low EF anywhere between 20 to 25%.  She is low output and has now been managed on midodrine  since 2023.  Additionally has had multiple CHF exacerbations this year and has high diuretic requirements requiring IV diuretics and metolazone .  At home she takes torsemide  80 mg twice daily. She is not a candidate for advanced therapies due to severity of lung disease.  Currently patient being evaluated for acute on chronic hypoxic hypercapnic respiratory failure in the setting of COPD/acute on chronic HFrEF with positive coronavirus 0C43 and RSV.  She has been significantly dyspneic and very short of breath even  with mild ambulation to the bathroom and has required BiPAP all day.  Additionally, yesterday she had diuretics held due to decrease in renal function.  But today they were resumed and she is currently going to get another dose of IV Lasix  80 mg once tonight and was resumed on torsemide  80 mg twice daily.  Unclear of her output however she has a full canister at the bedside.  Additionally, midodrine  was titrated up here and currently on 10 mg 3 times daily.  She is on BiPAP still in severely dyspneic however able to talk to me through the BiPAP.  Reports being ambulatory and functional at home and taking her diuretics and other medications as prescribed admissions not able to identify exactly.  Today she seems very lethargic and weak, struggles to sit upright and is curled up in a ball.  + Orthopnea, cough, shortness of breath.   Repeat BMP was ordered however on admission was almost 3000.  Negative chest x-ray.  Digoxin  level 0.6.  Creatinine 1.74 down from 2.3 yesterday.  WBC 14.3.  Past Medical History:  Diagnosis Date   Acute respiratory failure (HCC) 05/05/2018   Acute systolic congestive heart failure (HCC) 02/03/2018   AICD (automatic cardioverter/defibrillator) present    Allergy    Anxiety    Asthma    DM2 (diabetes mellitus, type 2) (HCC) 10/20/2020   Hypertension    PAF (paroxysmal atrial fibrillation) (HCC)    Presence of permanent cardiac pacemaker    Prophylactic measure 08/03/14-08/19/14   Prophyl. 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   LIMA  to LAD, SVG to D1, SVG to OM1, EVH via right thigh with implantation of Impella LD LVAD via direct aortic approach   Small cell lung cancer (HCC) 03/16/2014    Past Surgical History:  Procedure Laterality Date   BRONCHIAL BRUSHINGS  10/25/2020   Procedure: BRONCHIAL BRUSHINGS;  Surgeon: Shelah Lamar RAMAN, MD;  Location: [facility] ENDOSCOPY;  Service: Pulmonary;;   BRONCHIAL NEEDLE ASPIRATION BIOPSY  10/25/2020   Procedure:  BRONCHIAL NEEDLE ASPIRATION BIOPSIES;  Surgeon: Shelah Lamar RAMAN, MD;  Location: [facility] ENDOSCOPY;  Service: Pulmonary;;   CARDIAC DEFIBRILLATOR PLACEMENT  08/15/2018   MDT Visia AF MRI VR ICD implanted by Dr Zulma for primary prevention of sudden   CESAREAN SECTION     CORONARY ARTERY BYPASS GRAFT N/A 02/03/2018   Procedure: CORONARY ARTERY BYPASS GRAFTING (CABG);  Surgeon: Dusty Sudie DEL, MD;  Location: [facility] OR;  Service: Open Heart Surgery;  Laterality: N/A;  Time 3 using left internal mammary artery and endoscopically harvested right saphenous vein   CORONARY BALLOON ANGIOPLASTY N/A 02/03/2018   Procedure: CORONARY BALLOON ANGIOPLASTY;  Surgeon: Jordan, Peter M, MD;  Location: [facility] INVASIVE CV LAB;  Service: Cardiovascular;  Laterality: N/A;   CORONARY STENT INTERVENTION N/A 02/12/2019   Procedure: CORONARY STENT INTERVENTION;  Surgeon: Darron Deatrice LABOR, MD;  Location: [facility] INVASIVE CV LAB;  Service: Cardiovascular;  Laterality: N/A;   CORONARY/GRAFT ACUTE MI REVASCULARIZATION N/A 02/03/2018   Procedure: Coronary/Graft Acute MI Revascularization;  Surgeon: Jordan, Peter M, MD;  Location: [facility] INVASIVE CV LAB;  Service: Cardiovascular;  Laterality: N/A;   ENDOBRONCHIAL ULTRASOUND N/A 10/25/2020   Procedure: ENDOBRONCHIAL ULTRASOUND;  Surgeon: Shelah Lamar RAMAN, MD;  Location: [facility] ENDOSCOPY;  Service: Pulmonary;  Laterality: N/A;   FLEXIBLE BRONCHOSCOPY  10/25/2020   Procedure: FLEXIBLE BRONCHOSCOPY;  Surgeon: Shelah Lamar RAMAN, MD;  Location: [facility] ENDOSCOPY;  Service: Pulmonary;;   IABP INSERTION N/A 02/03/2018   Procedure: IABP Insertion;  Surgeon: Jordan, Peter M, MD;  Location: [facility] INVASIVE CV LAB;  Service: Cardiovascular;  Laterality: N/A;   INTRAOPERATIVE TRANSESOPHAGEAL ECHOCARDIOGRAM N/A 02/03/2018   Procedure: INTRAOPERATIVE TRANSESOPHAGEAL ECHOCARDIOGRAM;  Surgeon: Dusty Sudie DEL, MD;  Location: [facility] OR;  Service: Open Heart Surgery;  Laterality: N/A;   LEFT HEART CATH AND CORONARY ANGIOGRAPHY N/A  02/03/2018   Procedure: LEFT HEART CATH AND CORONARY ANGIOGRAPHY;  Surgeon: Jordan, Peter M, MD;  Location: [facility] INVASIVE CV LAB;  Service: Cardiovascular;  Laterality: N/A;   LEFT HEART CATH AND CORS/GRAFTS ANGIOGRAPHY N/A 02/12/2019   Procedure: LEFT HEART CATH AND CORS/GRAFTS ANGIOGRAPHY;  Surgeon: Darron Deatrice LABOR, MD;  Location: [facility] INVASIVE CV LAB;  Service: Cardiovascular;  Laterality: N/A;   MEDIASTINOSCOPY N/A 03/11/2014   Procedure: MEDIASTINOSCOPY;  Surgeon: Elspeth JAYSON Millers, MD;  Location: [facility] OR;  Service: Thoracic;  Laterality: N/A;   PLACEMENT OF IMPELLA LEFT VENTRICULAR ASSIST DEVICE  02/03/2018   Procedure: PLACEMENT OF IMPELLA LEFT VENTRICULAR ASSIST DEVICE LD;  Surgeon: Dusty Sudie DEL, MD;  Location: [facility] OR;  Service: Open Heart Surgery;;   REMOVAL OF IMPELLA LEFT VENTRICULAR ASSIST DEVICE N/A 02/08/2018   Procedure: REMOVAL OF IMPELLA LEFT VENTRICULAR ASSIST DEVICE;  Surgeon: Dusty Sudie DEL, MD;  Location: [facility] OR;  Service: Open Heart Surgery;  Laterality: N/A;   RIGHT HEART CATH N/A 02/03/2018   Procedure: RIGHT HEART CATH;  Surgeon: Jordan, Peter M, MD;  Location: [facility] INVASIVE CV LAB;  Service: Cardiovascular;  Laterality: N/A;   RIGHT HEART CATH N/A 05/09/2018   Procedure: RIGHT HEART CATH;  Surgeon: Cherrie,  Toribio SAUNDERS, MD;  Location: [facility] INVASIVE CV LAB;  Service: Cardiovascular;  Laterality: N/A;   TEE WITHOUT CARDIOVERSION N/A 02/08/2018   Procedure: TRANSESOPHAGEAL ECHOCARDIOGRAM (TEE);  Surgeon: Dusty Sudie DEL, MD;  Location: [facility] OR;  Service: Open Heart Surgery;  Laterality: N/A;   TUBAL LIGATION     VIDEO BRONCHOSCOPY WITH ENDOBRONCHIAL ULTRASOUND N/A 03/11/2014   Procedure: VIDEO BRONCHOSCOPY WITH ENDOBRONCHIAL ULTRASOUND;  Surgeon: Elspeth JAYSON Millers, MD;  Location: [facility] OR;  Service: Thoracic;  Laterality: N/A;     Inpatient Medications: Scheduled Meds:  apixaban   2.5 mg Oral BID   atorvastatin   80 mg Oral Daily   budesonide  (PULMICORT ) nebulizer solution  0.25 mg  Nebulization BID   dapagliflozin  propanediol  10 mg Oral Daily   digoxin   125 mcg Oral Daily   feeding supplement  237 mL Oral TID BM   furosemide   80 mg Intravenous Once   guaiFENesin -dextromethorphan   5 mL Oral Q6H   ipratropium-albuterol   3 mL Nebulization Q6H   ivabradine   7.5 mg Oral BID WC   methylPREDNISolone  (SOLU-MEDROL ) injection  40 mg Intravenous Q12H   midodrine   10 mg Oral TID WC   multivitamin with minerals  1 tablet Oral Daily   pantoprazole   40 mg Oral Daily   potassium chloride   40 mEq Oral Daily   spironolactone   12.5 mg Oral QHS   [START ON 12/18/2023] torsemide   80 mg Oral BID   Continuous Infusions:  PRN Meds: acetaminophen  **OR** acetaminophen , ALPRAZolam , cyclobenzaprine , levalbuterol , senna-docusate  Allergies:    Allergies  Allergen Reactions   Lactose Intolerance (Gi) Diarrhea   Codeine Nausea And Vomiting    Social History:   Social History   Socioeconomic History   Marital status: Married    Spouse name: Keven Washington     Number of children: 1   Years of education: Not on file   Highest education level: Not on file  Occupational History   Occupation: disabled  Tobacco Use   Smoking status: Former    Current packs/day: 0.00    Average packs/day: 1 pack/day for 20.0 years (20.0 ttl pk-yrs)    Types: Cigarettes    Start date: 03/13/1994    Quit date: 03/13/2014    Years since quitting: 9.7   Smokeless tobacco: Never  Vaping Use   Vaping status: Never Used  Substance and Sexual Activity   Alcohol use: Yes    Alcohol/week: 5.0 standard drinks of alcohol    Types: 5 Glasses of wine per week    Comment: a few glasses of wine weekly   Drug use: Yes    Types: Marijuana    Comment: medicinal, not daily   Sexual activity: Never  Other Topics Concern   Not on file  Social History Narrative   Not on file   Social Drivers of Health   Financial Resource Strain: Medium Risk (02/11/2021)   Overall Financial Resource Strain (CARDIA)     Difficulty of Paying Living Expenses: Somewhat hard  Food Insecurity: No Food Insecurity (12/16/2023)   Hunger Vital Sign    Worried About Running Out of Food in the Last Year: Never true    Ran Out of Food in the Last Year: Never true  Transportation Needs: Unmet Transportation Needs (12/16/2023)   PRAPARE - Transportation    Lack of Transportation (Medical): Yes    Lack of Transportation (Non-Medical): Yes  Physical Activity: Insufficiently Active (02/11/2021)   Exercise Vital Sign    Days of Exercise per Week: 2 days  Minutes of Exercise per Session: 10 min  Stress: Stress Concern Present (02/11/2021)   [employer] of Occupational Health - Occupational Stress Questionnaire    Feeling of Stress : To some extent  Social Connections: Unknown (02/11/2021)   Social Connection and Isolation Panel [NHANES]    Frequency of Communication with Friends and Family: More than three times a week    Frequency of Social Gatherings with Friends and Family: Twice a week    Attends Religious Services: Not on Marketing Executive or Organizations: Not on file    Attends Banker Meetings: Never    Marital Status: Married  Catering Manager Violence: Not At Risk (12/16/2023)   Humiliation, Afraid, Rape, and Kick questionnaire    Fear of Current or Ex-Partner: No    Emotionally Abused: No    Physically Abused: No    Sexually Abused: No    Family History:   Family History  Problem Relation Age of Onset   Heart disease Mother    Hypertension Mother    Heart attack Mother    Hypertension Maternal Grandmother    Cancer Maternal Grandmother    Diabetes Paternal Grandmother    Stroke Neg Hx      ROS:  Please see the history of present illness. All other ROS reviewed and negative.     Physical Exam/Data:   Vitals:   12/16/23 1946 12/16/23 2241 12/17/23 0134 12/17/23 0845  BP:    112/61  Pulse:  91  74  Resp:  19  18  Temp:    97.9 F (36.6 C)  TempSrc:    Oral  SpO2:  99% 96% 97% 97%  Weight:      Height:       No intake or output data in the 24 hours ending 12/17/23 1631    12/16/2023    4:19 AM 12/14/2023    9:51 PM 11/21/2023   11:34 AM  Last 3 Weights  Weight (lbs) 98 lb 15.8 oz 96 lb 1.9 oz 96 lb 3.2 oz  Weight (kg) 44.9 kg 43.6 kg 43.636 kg     Body mass index is 19.99 kg/m.  General:Frail and weak HEENT: normal Neck: + JVD Vascular: No carotid bruits; Distal pulses 2+ bilaterally Cardiac:  normal S1, S2; RRR; no murmur  Lungs: Positive crackles Abd: soft, nontender, no hepatomegaly  Ext: no edema Musculoskeletal:  No deformities, BUE and BLE strength normal and equal Skin: warm and dry  Neuro:  CNs 2-12 intact, no focal abnormalities noted Psych:  Normal affect   EKG:  The EKG was personally reviewed and demonstrates: Poor EKG but it looks like sinus in the 80s. Telemetry:  Telemetry was personally reviewed and demonstrates: Again poor image and with a lot of artifact.  Looks to be sinus though.  Relevant CV Studies: Limited echocardiogram 12/15/2023 1. Left ventricular ejection fraction, by estimation, is <20%. The left  ventricle has severely decreased function. The left ventricle demonstrates  global hypokinesis. The left ventricular internal cavity size was dilated.  Left ventricular diastolic  parameters are consistent with Grade II diastolic dysfunction  (pseudonormalization).   2. Right ventricular systolic function moderate to severely reduced. The  right ventricular size is normal.   3. Left atrial size was grossly normal.   4. Right atrial size was grossly normal.   5. The mitral valve is grossly normal. Mild mitral valve regurgitation.  No evidence of mitral stenosis.   6. The aortic valve  was not well visualized. Aortic valve regurgitation  is moderate. Aortic valve sclerosis is present, with no evidence of aortic  valve stenosis.   7. The inferior vena cava is normal in size with greater than 50%  respiratory  variability, suggesting right atrial pressure of 3 mmHg.   Comparison(s): A prior study was performed on 09/26/2023. LVEF 20-25%,  global hypokinesis, moderately dilated ventricle, grade 2 diastolic  dysfunction, RVSP 36 mmHg, see report for additional details.   Conclusion(s)/Recommendation(s): No left ventricular mural or apical  thrombus/thrombi.    Laboratory Data:  High Sensitivity Troponin:  No results for input(s): TROPONINIHS in the last 720 hours.   Chemistry Recent Labs  Lab 12/15/23 0208 12/16/23 0543 12/17/23 0432  NA 136 134* 133*  K 3.0* 3.7 4.2  CL 97* 93* 95*  CO2 23 24 25   GLUCOSE 237* 209* 257*  BUN 23 51* 59*  CREATININE 1.74* 2.30* 1.74*  CALCIUM  9.5 10.1 10.5*  GFRNONAA 33* 23* 33*  ANIONGAP 16* 17* 13    Recent Labs  Lab 12/14/23 2130 12/15/23 0208  PROT 8.2* 7.7  ALBUMIN  4.3 3.9  AST 33 30  ALT 17 18  ALKPHOS 102 93  BILITOT 1.3* 1.0   Lipids No results for input(s): CHOL, TRIG, HDL, LABVLDL, LDLCALC, CHOLHDL in the last 168 hours.  Hematology Recent Labs  Lab 12/14/23 2130 12/14/23 2258 12/15/23 0208 12/16/23 0543  WBC 13.3*  --  15.1* 14.3*  RBC 5.24*  --  4.79 4.67  HGB 15.4* 16.3* 14.1 13.7  HCT 49.5* 48.0* 43.9 41.2  MCV 94.5  --  91.6 88.2  MCH 29.4  --  29.4 29.3  MCHC 31.1  --  32.1 33.3  RDW 19.1*  --  18.6* 17.6*  PLT 247  --  161 188   Thyroid  No results for input(s): TSH, FREET4 in the last 168 hours.  BNP Recent Labs  Lab 12/14/23 2130  BNP 2,946.2*    DDimer No results for input(s): DDIMER in the last 168 hours.   Radiology/Studies:  ECHOCARDIOGRAM LIMITED Result Date: 12/15/2023    ECHOCARDIOGRAM LIMITED REPORT   Patient Name:   Johnella Miller-Washington  Date of Exam: 12/15/2023 Medical Rec #:  [id number]               Height:       59.0 in Accession #:    [id number]              Weight:       96.1 lb Date of Birth:  [birth year]               BSA:          1.351 m Patient Age:    62 years                 BP:           99/57 mmHg Patient Gender: F                       HR:           79 bpm. Exam Location:  Inpatient Procedure: Limited Echo, Limited Color Doppler and Intracardiac Opacification            Agent Indications:    CHF- Acute Diastolic I50.31, CHF- Acute Systolic I50.21  History:        Patient has prior history of Echocardiogram examinations, most  recent 09/26/2023. CHF and Cardiomyopathy, Previous Myocardial                 Infarction, Angina and CAD, Defibrillator, Pacemaker and Prior                 CABG, COPD and CKD, stage 3, Arrythmias:Atrial Fibrillation,                 Signs/Symptoms:Hypotension and Chest Pain; Risk                 Factors:Hypertension and Diabetes.  Sonographer:    Thea Norlander RCS Referring Phys: SUBRINA SUNDIL IMPRESSIONS  1. Left ventricular ejection fraction, by estimation, is <20%. The left ventricle has severely decreased function. The left ventricle demonstrates global hypokinesis. The left ventricular internal cavity size was dilated. Left ventricular diastolic parameters are consistent with Grade II diastolic dysfunction (pseudonormalization).  2. Right ventricular systolic function moderate to severely reduced. The right ventricular size is normal.  3. Left atrial size was grossly normal.  4. Right atrial size was grossly normal.  5. The mitral valve is grossly normal. Mild mitral valve regurgitation. No evidence of mitral stenosis.  6. The aortic valve was not well visualized. Aortic valve regurgitation is moderate. Aortic valve sclerosis is present, with no evidence of aortic valve stenosis.  7. The inferior vena cava is normal in size with greater than 50% respiratory variability, suggesting right atrial pressure of 3 mmHg. Comparison(s): A prior study was performed on 09/26/2023. LVEF 20-25%, global hypokinesis, moderately dilated ventricle, grade 2 diastolic dysfunction, RVSP 36 mmHg, see report for additional details.  Conclusion(s)/Recommendation(s): No left ventricular mural or apical thrombus/thrombi. FINDINGS  Left Ventricle: Left ventricular ejection fraction, by estimation, is <20%. The left ventricle has severely decreased function. The left ventricle demonstrates global hypokinesis. Definity  contrast agent was given IV to delineate the left ventricular endocardial borders. The left ventricular internal cavity size was dilated. There is no left ventricular hypertrophy. Left ventricular diastolic parameters are consistent with Grade II diastolic dysfunction (pseudonormalization). Right Ventricle: The right ventricular size is normal. Right ventricular systolic function moderate to severely reduced. Left Atrium: Left atrial size was grossly normal. Right Atrium: Right atrial size was grossly normal. Mitral Valve: The mitral valve is grossly normal. Mild mitral valve regurgitation. No evidence of mitral valve stenosis. Tricuspid Valve: The tricuspid valve is normal in structure. Tricuspid valve regurgitation is mild . No evidence of tricuspid stenosis. Aortic Valve: The aortic valve was not well visualized. Aortic valve regurgitation is moderate. Aortic regurgitation PHT measures 265 msec. Aortic valve sclerosis is present, with no evidence of aortic valve stenosis. Aortic valve peak gradient measures 16.3 mmHg. Pulmonic Valve: The pulmonic valve was not well visualized. Pulmonic valve regurgitation is not visualized. No evidence of pulmonic stenosis. Aorta: The aortic root and ascending aorta are structurally normal, with no evidence of dilitation. Venous: The inferior vena cava is normal in size with greater than 50% respiratory variability, suggesting right atrial pressure of 3 mmHg. IAS/Shunts: There is right bowing of the interatrial septum, suggestive of elevated left atrial pressure. Additional Comments: A device lead is visualized.  LEFT VENTRICLE PLAX 2D LVIDd:         6.00 cm   Diastology LVIDs:         5.40 cm   LV  e' medial:    5.77 cm/s LV PW:         0.90 cm   LV E/e' medial:  9.8 LV IVS:  0.40 cm   LV e' lateral:   11.20 cm/s LVOT diam:     1.70 cm   LV E/e' lateral: 5.1 LVOT Area:     2.27 cm  RIGHT VENTRICLE            IVC RV S prime:     7.62 cm/s  IVC diam: 1.10 cm TAPSE (M-mode): 1.1 cm LEFT ATRIUM         Index LA diam:    3.80 cm 2.81 cm/m  AORTIC VALVE AV Vmax:      201.67 cm/s AV Peak Grad: 16.3 mmHg AI PHT:       265 msec  AORTA Ao Root diam: 2.90 cm Ao Asc diam:  2.40 cm MITRAL VALVE MV Area (PHT): 4.54 cm    SHUNTS MV Decel Time: 167 msec    Systemic Diam: 1.70 cm MR Peak grad: 56.9 mmHg MR Vmax:      377.00 cm/s MV E velocity: 56.60 cm/s MV A velocity: 29.00 cm/s MV E/A ratio:  1.95 Sunit Tolia Electronically signed by Madonna Large Signature Date/Time: 12/15/2023/5:06:25 PM    Final    DG Chest Portable 1 View Result Date: 12/14/2023 CLINICAL DATA:  09/25/2023 EXAM: PORTABLE CHEST 1 VIEW COMPARISON:  09/25/2023 FINDINGS: Cardiac shadow is stable. Defibrillator and postsurgical changes are again noted. Stimulator pack is noted in the right chest. Lungs are hyperinflated but clear. No bony abnormality is seen. IMPRESSION: No acute abnormality noted. Electronically Signed   By: Oneil Devonshire M.D.   On: 12/14/2023 22:56     Assessment and Plan:   Acute on chronic HFrEF with Medtronic ICD and Barostim NYHA III EF has persistently been depressed around 20 to 25% for the last 5 years however this admission shows continual decline of less than 20% with grade 2 diastolic dysfunction and moderate to severely reduced RV function.  She is low output heart failure with a very poor prognosis.  She is volume up on today's exam and persistently requiring BiPAP though much of this could be the viral illnesses as well.  Unfortunately will likely need high diuretic regiment but might need inotropic support which would better be served in the ICU.  Not sure if this is still within her parameters of GOC.  MD will  need to discuss and further outlined plan thereafter.  Currently on Farxiga  10 mg, digoxin  125 mcg, ivabradine  7.5 mg twice daily, midodrine  10 mg 3 times daily. Stop spironolactone  12.5 mg with soft pressures.  Today she will be getting 1 dose of IV Lasix  80 mg.  And home torsemide  80 mg twice daily.  Unclear of I's and O's, not documented accurately Not a candidate for advanced therapies due to severe lung disease Continue with midodrine  10 mg 3 times daily  CKD Creatinine 1.74 down from 2.3 yesterday when diuretics were being held.  Unfortunately likely to worsen with low output state and need for aggressive diuresis.   PAF Appears to be in NSR today, a lot of artifact on telemetry and on EKG though.  Continue reduced dose of Eliquis  2.5 mg twice daily.  CAD status post CABG times 01/2018 Last intervention was in 2020 where she had DES to ostial ramus extending to the left main.  No chest complaints.  Continue atorvastatin   GOC Palliative care is following.  She has had more frequent hospital admissions throughout this year and seems to be functionally declining.  Need to discuss exactly what extent she is wanting as far as  medical care.  History of small cell lung cancer COPD OSA on CPAP Renal infarct 2022 +covid/RSV Per primary team  Risk Assessment/Risk Scores:   [place]  Heart Association (NYHA) Functional Class NYHA Class III  CHA2DS2-VASc Score = 5   This indicates a 7.2% annual risk of stroke. The patient's score is based upon: CHF History: 1 HTN History: 1 Diabetes History: 1 Stroke History: 0 Vascular Disease History: 1 Age Score: 0 Gender Score: 1    For questions or updates, please contact [facility] Please consult [email or web address] for contact info under    Signed, Thom LITTIE Sluder, PA-C  12/17/2023 4:31 PM

## 2023-12-17 NOTE — TOC CM/SW Note (Signed)
 Transition of Care [facility]) - Inpatient Brief Assessment   Patient Details  Name: Brittany [name]  MRN: [id number] Date of Birth: [birth year]  Transition of Care [facility]) CM/SW Contact:    Inocente GORMAN Kindle, LCSW Phone Number: 12/17/2023, 8:29 AM   Clinical Narrative: TOC received transportation consult. Community Resources Placed on AVS. Patient is also able to contact her Medicaid to schedule transport.    Transition of Care Asessment: Insurance and Status: Insurance coverage has been reviewed Patient has primary care physician: Yes Home environment has been reviewed: From home Prior level of function:: Independent Prior/Current Home Services: No current home services Social Drivers of Health Review: SDOH reviewed interventions complete Readmission risk has been reviewed: Yes Transition of care needs: no transition of care needs at this time

## 2023-12-17 NOTE — Progress Notes (Signed)
 Pharmacist Heart Failure Core Measure Documentation  Assessment: Brittany [name]  has an EF documented as <20% on 12/15/23 by Echo.  Rationale: Heart failure patients with left ventricular systolic dysfunction (LVSD) and an EF < 40% should be prescribed an angiotensin converting enzyme inhibitor (ACEI) or angiotensin receptor blocker (ARB) at discharge unless a contraindication is documented in the medical record.  This patient is not currently on an ACEI or ARB for HF.  This note is being placed in the record in order to provide documentation that a contraindication to the use of these agents is present for this encounter.  ACE Inhibitor or Angiotensin Receptor Blocker is contraindicated (specify all that apply)  []   ACEI allergy AND ARB allergy []   Angioedema []   Moderate or severe aortic stenosis []   Hyperkalemia [x]   Hypotension (on midodrine ) []   Renal artery stenosis [x]   Worsening renal function, preexisting renal disease or dysfunction   Delon Sax, PharmD, BCPS Clinical Pharmacist Clinical phone for 12/17/2023 is [phone] 12/17/2023 8:42 AM

## 2023-12-17 NOTE — Progress Notes (Signed)
 PROGRESS NOTE        PATIENT DETAILS Name: Brittany [name]  Age: 63 y.o. Sex: female Date of Birth: 1961-08-09 Admit Date: 12/14/2023 Admitting Physician Micaela Speaker, MD ERE:Tpodnw, Raguel, MD  Brief Summary: Patient is a 63 y.o.  female with history of COPD on home O2, HFrEF, AICD implantation, PAF, CKD stage IIIb-presented with worsening shortness of breath-found to have acute on chronic hypoxic/hypercarbic respiratory failure in the setting of COPD/HFrEF exacerbation-due to RSV/coronavirus infection.  Significant events: 1/3>> admit to [facility]-acute on chronic hypoxic respiratory failure-CHF/COPD exacerbation-+ RSV/coronavirus +ve on RVP  Significant studies: 1/3>> CXR: No PNA 1/4>> Limited echo: EF <20%, RV systolic function moderate to severely reduced.  Significant microbiology data: 1/3>> COVID/influenza/RSV PCR: Negative 1/4>> respiratory virus panel:+ve for coronavirus OC43, RSV 1/4>> blood cultures: No growth  Procedures: None  Consults: None  Subjective: Was short of breath earlier this morning when she walked back to bed from the bathroom.  Objective: Vitals: Blood pressure 112/61, pulse 74, temperature 97.9 F (36.6 C), temperature source Oral, resp. rate 18, height 4' 11 (1.499 m), weight 44.9 kg, SpO2 97%.   Exam: Gen Exam:Alert awake-not in any distress HEENT:atraumatic, normocephalic Chest: Moving air-but has bilateral rhonchi. CVS:S1S2 regular Abdomen:soft non tender, non distended Extremities:no edema Neurology: Non focal Skin: no rash  Pertinent Labs/Radiology:    Latest Ref Rng & Units 12/16/2023    5:43 AM 12/15/2023    2:08 AM 12/14/2023   10:58 PM  CBC  WBC 4.0 - 10.5 K/uL 14.3  15.1    Hemoglobin 12.0 - 15.0 g/dL 86.2  85.8  83.6   Hematocrit 36.0 - 46.0 % 41.2  43.9  48.0   Platelets 150 - 400 K/uL 188  161      Lab Results  Component Value Date   NA 133 (L) 12/17/2023   K 4.2 12/17/2023   CL 95 (L)  12/17/2023   CO2 25 12/17/2023     Assessment/Plan: Acute on chronic hypoxic/hypercarbic respiratory failure (on home O2-2-3 L) Secondary to COPD/CHF exacerbation On initial presentation-required BiPAP-but clinically improved with diuretics/bronchodilators Slowly titrate down FiO2 back to her usual home regimen.  COPD exacerbation secondary to coronavirus/RSV infection Some more rhonchi today compared to yesterday Continue IV Solu-Medrol  Continue bronchodilators As needed antitussives Incentive spirometry/flutter valve Mobilize   Acute on chronic HFrEF AICD in place Volume status reasonable-no peripheral edema Diuretics held on 1/5 due to AKI-now that renal function significantly better-resume Demadex /Aldactone  Remains on digoxin /Corlanor   AKI on CKD stage IIIb AKI hemodynamically mediated-possibly related to low flow state/cardiorenal syndrome Improving-diuretics held and midodrine  dosage increased on 1/5  Resume diuretics today Continue midodrine  Avoid nephrotoxic agents Follow electrolytes closely.  PAF Telemetry monitoring Digoxin  level stable this morning. 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   Weight as of this encounter: 44.9 kg.   Code status:  Code Status: Limited: Do not attempt resuscitation (DNR) -DNR-LIMITED -Do Not Intubate/DNI    DVT Prophylaxis: apixaban  (ELIQUIS ) tablet 2.5 mg Start: 12/15/23 0100 SCDs Start:  12/15/23 0059 Place TED hose Start: 12/15/23 0059 apixaban  (ELIQUIS ) tablet 2.5 mg    Family Communication: None at bedside   Disposition Plan: Status is: Inpatient Remains inpatient appropriate because: Severity of illness   Planned Discharge Destination:Home health   Diet: Diet Order             Diet Heart Room service appropriate? Yes; Fluid consistency: Thin; Fluid restriction: 2000 mL Fluid  Diet effective now                     Antimicrobial agents: Anti-infectives (From admission, onward)    Start     Dose/Rate Route Frequency Ordered Stop   12/16/23 0000  cefTRIAXone  (ROCEPHIN ) 2 g in sodium chloride  0.9 % 100 mL IVPB  Status:  Discontinued        2 g 200 mL/hr over 30 Minutes Intravenous Every 24 hours 12/15/23 0058 12/15/23 2117   12/15/23 0200  doxycycline  (VIBRAMYCIN ) 100 mg in dextrose  5 % 250 mL IVPB  Status:  Discontinued        100 mg 125 mL/hr over 120 Minutes Intravenous Every 12 hours 12/15/23 0105 12/15/23 1038   12/15/23 0145  cefTRIAXone  (ROCEPHIN ) 2 g in sodium chloride  0.9 % 100 mL IVPB        2 g 200 mL/hr over 30 Minutes Intravenous  Once 12/15/23 0135 12/15/23 0259   12/15/23 0100  azithromycin  (ZITHROMAX ) 500 mg in sodium chloride  0.9 % 250 mL IVPB  Status:  Discontinued        500 mg 250 mL/hr over 60 Minutes Intravenous Every 24 hours 12/15/23 0058 12/15/23 0104        MEDICATIONS: Scheduled Meds:  apixaban   2.5 mg Oral BID   atorvastatin   80 mg Oral Daily   budesonide  (PULMICORT ) nebulizer solution  0.25 mg Nebulization BID   digoxin   125 mcg Oral Daily   feeding supplement  237 mL Oral Daily   guaiFENesin -dextromethorphan   5 mL Oral Q6H   ipratropium-albuterol   3 mL Nebulization Q6H   ivabradine   7.5 mg Oral BID WC   methylPREDNISolone  (SOLU-MEDROL ) injection  40 mg Intravenous Q12H   midodrine   10 mg Oral TID WC   pantoprazole   40 mg Oral Daily   potassium chloride   40 mEq Oral Daily   spironolactone   12.5 mg Oral QHS    torsemide   80 mg Oral Daily   Continuous Infusions: PRN Meds:.acetaminophen  **OR** acetaminophen , cyclobenzaprine , levalbuterol , senna-docusate   I have personally reviewed following labs and imaging studies  LABORATORY DATA: CBC: Recent Labs  Lab 12/14/23 2130 12/14/23 2258 12/15/23 0208 12/16/23 0543  WBC 13.3*  --  15.1* 14.3*  NEUTROABS 10.4*  --   --   --   HGB 15.4* 16.3* 14.1 13.7  HCT 49.5* 48.0* 43.9 41.2  MCV 94.5  --  91.6 88.2  PLT 247  --  161 188    Basic Metabolic Panel: Recent Labs  Lab 12/14/23 2130 12/14/23 2258 12/15/23 0208 12/16/23 0543 12/17/23 0432  NA 139 135 136 134* 133*  K 3.4* 5.7* 3.0* 3.7 4.2  CL 99  --  97* 93* 95*  CO2 20*  --  23 24 25   GLUCOSE 263*  --  237* 209* 257*  BUN 20  --  23 51* 59*  CREATININE 1.67*  --  1.74* 2.30* 1.74*  CALCIUM  10.1  --  9.5 10.1 10.5*    GFR: Estimated Creatinine Clearance: 22.6 mL/min (A) (by C-G formula based on SCr of 1.74 mg/dL (H)).  Liver Function Tests: Recent Labs  Lab 12/14/23 2130 12/15/23 0208  AST 33 30  ALT 17 18  ALKPHOS 102 93  BILITOT 1.3* 1.0  PROT 8.2* 7.7  ALBUMIN  4.3 3.9   No results for input(s): LIPASE, AMYLASE in the last 168 hours. No results for input(s): AMMONIA in the last 168 hours.  Coagulation Profile: No results for input(s): INR, PROTIME in the last 168 hours.  Cardiac Enzymes: No results for input(s): CKTOTAL, CKMB, CKMBINDEX, TROPONINI in the last 168 hours.  BNP (last 3 results) No results for input(s): PROBNP in the last 8760 hours.  Lipid Profile: No results for input(s): CHOL, HDL, LDLCALC, TRIG, CHOLHDL, LDLDIRECT in the last 72 hours.  Thyroid  Function Tests: No results for input(s): TSH, T4TOTAL, FREET4, T3FREE, THYROIDAB in the last 72 hours.  Anemia Panel: No results for input(s): VITAMINB12, FOLATE, FERRITIN, TIBC, IRON, RETICCTPCT in the last 72 hours.  Urine analysis:     Component Value Date/Time   COLORURINE YELLOW 05/23/2023 1519   APPEARANCEUR CLEAR 05/23/2023 1519   LABSPEC 1.013 05/23/2023 1519   PHURINE 6.0 05/23/2023 1519   GLUCOSEU NEGATIVE 05/23/2023 1519   HGBUR NEGATIVE 05/23/2023 1519   BILIRUBINUR NEGATIVE 05/23/2023 1519   KETONESUR NEGATIVE 05/23/2023 1519   PROTEINUR NEGATIVE 05/23/2023 1519   UROBILINOGEN 1.0 03/15/2015 1117   NITRITE NEGATIVE 05/23/2023 1519   LEUKOCYTESUR NEGATIVE 05/23/2023 1519    Sepsis Labs: Lactic Acid, Venous    Component Value Date/Time   LATICACIDVEN 1.3 08/04/2023 1111    MICROBIOLOGY: Recent Results (from the past 240 hours)  Resp panel by RT-PCR (RSV, Flu A&B, Covid) Anterior Nasal Swab     Status: None   Collection Time: 12/14/23  9:31 PM   Specimen: Anterior Nasal Swab  Result Value Ref Range Status   SARS Coronavirus 2 by RT PCR NEGATIVE NEGATIVE Final   Influenza A by PCR NEGATIVE NEGATIVE Final   Influenza B by PCR NEGATIVE NEGATIVE Final    Comment: (NOTE) The Xpert Xpress SARS-CoV-2/FLU/RSV plus assay is intended as an aid in the diagnosis of influenza from Nasopharyngeal swab specimens and should not be used as a sole basis for treatment. Nasal washings and aspirates are unacceptable for Xpert Xpress SARS-CoV-2/FLU/RSV testing.  Fact Sheet for Patients: bloggercourse.com  Fact Sheet for Healthcare Providers: seriousbroker.it  This test is not yet approved or cleared by the [place]  FDA and has been authorized for detection and/or diagnosis of SARS-CoV-2 by FDA under an Emergency Use Authorization (EUA). This EUA will remain in effect (meaning this test can be used) for the duration of the COVID-19 declaration under Section 564(b)(1) of the Act, 21 U.S.C. section 360bbb-3(b)(1), unless the authorization is terminated or revoked.     Resp Syncytial Virus by PCR NEGATIVE NEGATIVE Final    Comment: (NOTE) Fact Sheet for  Patients: bloggercourse.com  Fact Sheet for Healthcare Providers: seriousbroker.it  This test is not yet approved or cleared by the [place]  FDA and has been authorized for detection and/or diagnosis of SARS-CoV-2 by FDA under an Emergency Use Authorization (EUA). This EUA will remain in effect (meaning this test can be used) for the duration of the COVID-19 declaration under Section 564(b)(1) of the Act, 21 U.S.C. section 360bbb-3(b)(1), unless the authorization is terminated or revoked.  Performed at [facility], [address] [place], [place] [address]   Respiratory (~20 pathogens) panel by PCR     Status: Abnormal   Collection Time: 12/15/23  1:00 AM   Specimen: Nasopharyngeal Swab; Respiratory  Result Value Ref Range Status   Adenovirus NOT DETECTED NOT DETECTED Final   Coronavirus 229E NOT DETECTED NOT DETECTED Final    Comment: (NOTE) The Coronavirus on the Respiratory Panel, DOES NOT test for the novel  Coronavirus (2019 nCoV)    Coronavirus HKU1 NOT DETECTED NOT DETECTED Final   Coronavirus NL63 NOT DETECTED NOT DETECTED Final   Coronavirus OC43 DETECTED (A) NOT DETECTED Final   Metapneumovirus NOT DETECTED NOT DETECTED Final   Rhinovirus / Enterovirus NOT DETECTED NOT DETECTED Final   Influenza A NOT DETECTED NOT DETECTED Final   Influenza B NOT DETECTED NOT DETECTED Final   Parainfluenza Virus 1 NOT DETECTED NOT DETECTED Final   Parainfluenza Virus 2 NOT DETECTED NOT DETECTED Final   Parainfluenza Virus 3 NOT DETECTED NOT DETECTED Final   Parainfluenza Virus 4 NOT DETECTED NOT DETECTED Final   Respiratory Syncytial Virus DETECTED (A) NOT DETECTED Final   Bordetella pertussis NOT DETECTED NOT DETECTED Final   Bordetella Parapertussis NOT DETECTED NOT DETECTED Final   Chlamydophila pneumoniae NOT DETECTED NOT DETECTED Final   Mycoplasma pneumoniae NOT DETECTED NOT DETECTED Final    Comment: Performed at  [facility], [address], [place], [place] [address]  Culture, blood (Routine X 2) w Reflex to ID Panel     Status: None (Preliminary result)   Collection Time: 12/15/23  1:05 AM   Specimen: BLOOD LEFT ARM  Result Value Ref Range Status   Specimen Description BLOOD LEFT ARM  Final   Special Requests   Final    BOTTLES DRAWN AEROBIC ONLY Blood Culture results may not be optimal due to an inadequate volume of blood received in culture bottles   Culture   Final    NO GROWTH 2 DAYS Performed at [facility], [address], [place], [place] [address]    Report Status PENDING  Incomplete  Culture, blood (Routine X 2) w Reflex to ID Panel     Status: None (Preliminary result)   Collection Time: 12/15/23  2:07 AM   Specimen: BLOOD RIGHT ARM  Result Value Ref Range Status   Specimen Description BLOOD RIGHT ARM  Final   Special Requests   Final    BOTTLES DRAWN AEROBIC AND ANAEROBIC Blood Culture adequate volume   Culture   Final    NO GROWTH 2 DAYS Performed at [facility], [address] [place], [place] [address]    Report Status PENDING  Incomplete    RADIOLOGY STUDIES/RESULTS: ECHOCARDIOGRAM LIMITED Result Date: 12/15/2023    ECHOCARDIOGRAM LIMITED REPORT   Patient Name:   Natsha Guidry  Date of Exam: 12/15/2023 Medical Rec #:  [id number]               Height:       59.0 in Accession #:    [id number]              Weight:       96.1 lb Date of Birth:  August 23, 1961               BSA:          1.351 m Patient Age:    44 years  BP:           99/57 mmHg Patient Gender: F                       HR:           79 bpm. Exam Location:  Inpatient Procedure: Limited Echo, Limited Color Doppler and Intracardiac Opacification            Agent Indications:    CHF- Acute Diastolic I50.31, CHF- Acute Systolic I50.21  History:        Patient has prior history of Echocardiogram examinations, most                 recent 09/26/2023. CHF and Cardiomyopathy, Previous  Myocardial                 Infarction, Angina and CAD, Defibrillator, Pacemaker and Prior                 CABG, COPD and CKD, stage 3, Arrythmias:Atrial Fibrillation,                 Signs/Symptoms:Hypotension and Chest Pain; Risk                 Factors:Hypertension and Diabetes.  Sonographer:    Thea Norlander RCS Referring Phys: SUBRINA SUNDIL IMPRESSIONS  1. Left ventricular ejection fraction, by estimation, is <20%. The left ventricle has severely decreased function. The left ventricle demonstrates global hypokinesis. The left ventricular internal cavity size was dilated. Left ventricular diastolic parameters are consistent with Grade II diastolic dysfunction (pseudonormalization).  2. Right ventricular systolic function moderate to severely reduced. The right ventricular size is normal.  3. Left atrial size was grossly normal.  4. Right atrial size was grossly normal.  5. The mitral valve is grossly normal. Mild mitral valve regurgitation. No evidence of mitral stenosis.  6. The aortic valve was not well visualized. Aortic valve regurgitation is moderate. Aortic valve sclerosis is present, with no evidence of aortic valve stenosis.  7. The inferior vena cava is normal in size with greater than 50% respiratory variability, suggesting right atrial pressure of 3 mmHg. Comparison(s): A prior study was performed on 09/26/2023. LVEF 20-25%, global hypokinesis, moderately dilated ventricle, grade 2 diastolic dysfunction, RVSP 36 mmHg, see report for additional details. Conclusion(s)/Recommendation(s): No left ventricular mural or apical thrombus/thrombi. FINDINGS  Left Ventricle: Left ventricular ejection fraction, by estimation, is <20%. The left ventricle has severely decreased function. The left ventricle demonstrates global hypokinesis. Definity  contrast agent was given IV to delineate the left ventricular endocardial borders. The left ventricular internal cavity size was dilated. There is no left ventricular  hypertrophy. Left ventricular diastolic parameters are consistent with Grade II diastolic dysfunction (pseudonormalization). Right Ventricle: The right ventricular size is normal. Right ventricular systolic function moderate to severely reduced. Left Atrium: Left atrial size was grossly normal. Right Atrium: Right atrial size was grossly normal. Mitral Valve: The mitral valve is grossly normal. Mild mitral valve regurgitation. No evidence of mitral valve stenosis. Tricuspid Valve: The tricuspid valve is normal in structure. Tricuspid valve regurgitation is mild . No evidence of tricuspid stenosis. Aortic Valve: The aortic valve was not well visualized. Aortic valve regurgitation is moderate. Aortic regurgitation PHT measures 265 msec. Aortic valve sclerosis is present, with no evidence of aortic valve stenosis. Aortic valve peak gradient measures 16.3 mmHg. Pulmonic Valve: The pulmonic valve was not well visualized. Pulmonic valve regurgitation is not visualized. No evidence of pulmonic stenosis.  Aorta: The aortic root and ascending aorta are structurally normal, with no evidence of dilitation. Venous: The inferior vena cava is normal in size with greater than 50% respiratory variability, suggesting right atrial pressure of 3 mmHg. IAS/Shunts: There is right bowing of the interatrial septum, suggestive of elevated left atrial pressure. Additional Comments: A device lead is visualized.  LEFT VENTRICLE PLAX 2D LVIDd:         6.00 cm   Diastology LVIDs:         5.40 cm   LV e' medial:    5.77 cm/s LV PW:         0.90 cm   LV E/e' medial:  9.8 LV IVS:        0.40 cm   LV e' lateral:   11.20 cm/s LVOT diam:     1.70 cm   LV E/e' lateral: 5.1 LVOT Area:     2.27 cm  RIGHT VENTRICLE            IVC RV S prime:     7.62 cm/s  IVC diam: 1.10 cm TAPSE (M-mode): 1.1 cm LEFT ATRIUM         Index LA diam:    3.80 cm 2.81 cm/m  AORTIC VALVE AV Vmax:      201.67 cm/s AV Peak Grad: 16.3 mmHg AI PHT:       265 msec  AORTA Ao Root  diam: 2.90 cm Ao Asc diam:  2.40 cm MITRAL VALVE MV Area (PHT): 4.54 cm    SHUNTS MV Decel Time: 167 msec    Systemic Diam: 1.70 cm MR Peak grad: 56.9 mmHg MR Vmax:      377.00 cm/s MV E velocity: 56.60 cm/s MV A velocity: 29.00 cm/s MV E/A ratio:  1.95 Sunit Tolia Electronically signed by Madonna Large Signature Date/Time: 12/15/2023/5:06:25 PM    Final      LOS: 2 days   Donalda Applebaum, MD  [facility]    To contact the attending provider between 7A-7P or the covering provider during after hours 7P-7A, please log into the web site [email or web address] and access using universal [place] password for that web site. If you do not have the password, please call the hospital operator.  12/17/2023, 11:33 AM

## 2023-12-17 NOTE — Discharge Instructions (Addendum)
 Transportation Resources  -[facility] [place] [phone]  -[facility] for an application. No fee for people over the age of 7. P: [phone]. Address: [address], [place], [place] [address]  -[employer]-Age 64 and over. Limit of 1 ride per week for ambulatory participants. Limit 1 ride per month for non-ambulatory participants needing wheelchair transportation. Contact: [phone] ([place]), [phone] [facility]).  -[employer] [place]: Available for people with disabilities who are unable to use fixed-route bus service. Fee applies. Contact: [phone] (For application [phone] (Customer [phone] (I-ride reservation [phone] (Access [place] reservation line)

## 2023-12-18 ENCOUNTER — Inpatient Hospital Stay (HOSPITAL_COMMUNITY): Payer: BC Managed Care – PPO

## 2023-12-18 DIAGNOSIS — J441 Chronic obstructive pulmonary disease with (acute) exacerbation: Secondary | ICD-10-CM | POA: Diagnosis not present

## 2023-12-18 DIAGNOSIS — J9601 Acute respiratory failure with hypoxia: Secondary | ICD-10-CM | POA: Diagnosis not present

## 2023-12-18 DIAGNOSIS — I10 Essential (primary) hypertension: Secondary | ICD-10-CM | POA: Diagnosis not present

## 2023-12-18 DIAGNOSIS — I5041 Acute combined systolic (congestive) and diastolic (congestive) heart failure: Secondary | ICD-10-CM | POA: Diagnosis not present

## 2023-12-18 DIAGNOSIS — Z95810 Presence of automatic (implantable) cardiac defibrillator: Secondary | ICD-10-CM | POA: Diagnosis not present

## 2023-12-18 DIAGNOSIS — I255 Ischemic cardiomyopathy: Secondary | ICD-10-CM

## 2023-12-18 DIAGNOSIS — I5022 Chronic systolic (congestive) heart failure: Secondary | ICD-10-CM | POA: Diagnosis not present

## 2023-12-18 LAB — BASIC METABOLIC PANEL
Anion gap: 17 — ABNORMAL HIGH (ref 5–15)
BUN: 63 mg/dL — ABNORMAL HIGH (ref 8–23)
CO2: 29 mmol/L (ref 22–32)
Calcium: 10.8 mg/dL — ABNORMAL HIGH (ref 8.9–10.3)
Chloride: 89 mmol/L — ABNORMAL LOW (ref 98–111)
Creatinine, Ser: 1.86 mg/dL — ABNORMAL HIGH (ref 0.44–1.00)
GFR, Estimated: 30 mL/min — ABNORMAL LOW (ref >60)
Glucose, Bld: 311 mg/dL — ABNORMAL HIGH (ref 70–99)
Potassium: 4.1 mmol/L (ref 3.5–5.1)
Sodium: 135 mmol/L (ref 135–145)

## 2023-12-18 LAB — BRAIN NATRIURETIC PEPTIDE: B Natriuretic Peptide: 3806.1 pg/mL — ABNORMAL HIGH (ref 0.0–100.0)

## 2023-12-18 MED ORDER — ISOSORB DINITRATE-HYDRALAZINE 20-37.5 MG PO TABS
1.0000 | ORAL_TABLET | Freq: Three times a day (TID) | ORAL | Status: DC
  Administered 2023-12-18: 1 via ORAL
  Filled 2023-12-18 (×3): qty 1

## 2023-12-18 MED ORDER — MIDODRINE HCL 5 MG PO TABS
5.0000 mg | ORAL_TABLET | Freq: Three times a day (TID) | ORAL | Status: DC
  Administered 2023-12-18 – 2023-12-22 (×11): 5 mg via ORAL
  Filled 2023-12-18 (×13): qty 1

## 2023-12-18 MED ORDER — SPIRONOLACTONE 12.5 MG HALF TABLET
12.5000 mg | ORAL_TABLET | Freq: Every day | ORAL | Status: DC
  Administered 2023-12-18: 12.5 mg via ORAL
  Filled 2023-12-18 (×2): qty 1

## 2023-12-18 MED ORDER — FUROSEMIDE 10 MG/ML IJ SOLN: 80.0000 mg | Freq: Once | INTRAMUSCULAR | Status: DC

## 2023-12-18 MED ORDER — PREDNISONE 20 MG PO TABS
40.0000 mg | ORAL_TABLET | Freq: Every day | ORAL | Status: AC
  Administered 2023-12-18 – 2023-12-19 (×2): 40 mg via ORAL
  Filled 2023-12-18 (×2): qty 2

## 2023-12-18 MED ORDER — FUROSEMIDE 10 MG/ML IJ SOLN
80.0000 mg | Freq: Once | INTRAMUSCULAR | Status: AC
  Administered 2023-12-18: 80 mg via INTRAVENOUS
  Filled 2023-12-18: qty 8

## 2023-12-18 NOTE — Progress Notes (Addendum)
 Patient Name: Brittany [name]  Date of Encounter: 12/18/2023 [facility] Cardiologist: Toribio Fuel, MD  12/14/2023 .admit Length of stay: 3  Interval Summary  .    Brittany [name]  is a 63 y.o. female with a hx of chronic HFrEF status post ICD and Barostim (04/13/22), CAD status post CABG in 2019, last had a catheterization on 02/12/2019 revealing occluded LIMA to LAD, patent vein graft to D1 which retrogradely fills the LAD, patent SVG to Cx and mild disease in RCA.  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  Now admitted with acute hypoxic and hypercarbic respiratory failure due to RSV and coronavirus infection along with exacerbation of COPD and HFrEF.  This morning patient feels better but still has cough and wheezing.  Was able to sleep with minimal reclined position.  No chest pain or palpitations.  Physical Exam    Vitals:   12/18/23 0248 12/18/23 0415 12/18/23 0708 12/18/23 0800  BP:      Pulse: 73     Resp: 16     Temp:  97.6 F (36.4 C)  97.6 F (36.4 C)  TempSrc:  Axillary  Oral  SpO2: 97%     Weight:   49.1 kg   Height:       Physical Exam Neck:     Vascular: JVD present. No carotid bruit.  Cardiovascular:     Rate and Rhythm: Normal rate and regular rhythm.     Pulses: Intact distal pulses.          Dorsalis pedis pulses are 0 on the right side and 0 on the left side.       Posterior tibial pulses are 0 on the right side and 0 on the left side.     Heart sounds: S1 normal and S2 normal. Murmur heard.     Early systolic murmur is present with a grade of 2/6 at the upper right sternal border.     No gallop.  Pulmonary:     Effort: Pulmonary effort is normal.     Breath sounds: Wheezing (diffuse bilateral) present.  Abdominal:     General: Bowel sounds are normal.     Palpations: Abdomen is soft.  Musculoskeletal:     Right lower  leg: No edema.     Left lower leg: No edema.        12/18/2023    7:08 AM 12/16/2023    4:19 AM 12/14/2023    9:51 PM  Last 3 Weights  Weight (lbs) 108 lb 3.9 oz 98 lb 15.8 oz 96 lb 1.9 oz  Weight (kg) 49.1 kg 44.9 kg 43.6 kg      Labs   Lab Results  Component Value Date   NA 135 12/18/2023   K 4.1 12/18/2023   CO2 29 12/18/2023   GLUCOSE 311 (H) 12/18/2023   BUN 63 (H) 12/18/2023   CREATININE 1.86 (H) 12/18/2023   CALCIUM  10.8 (H) 12/18/2023   EGFR 42 (L) 10/11/2023   GFRNONAA 30 (L) 12/18/2023       Latest Ref Rng & Units 12/18/2023    4:30 AM 12/17/2023    4:32 AM 12/16/2023    5:43 AM  BMP  Glucose 70 - 99 mg/dL 688  742  790   BUN 8 - 23 mg/dL 63  59  51   Creatinine 0.44 - 1.00 mg/dL 8.13  8.25  7.69  Sodium 135 - 145 mmol/L 135  133  134   Potassium 3.5 - 5.1 mmol/L 4.1  4.2  3.7   Chloride 98 - 111 mmol/L 89  95  93   CO2 22 - 32 mmol/L 29  25  24    Calcium  8.9 - 10.3 mg/dL 89.1  89.4  89.8        Latest Ref Rng & Units 12/16/2023    5:43 AM 12/15/2023    2:08 AM 12/14/2023   10:58 PM  CBC  WBC 4.0 - 10.5 K/uL 14.3  15.1    Hemoglobin 12.0 - 15.0 g/dL 86.2  85.8  83.6   Hematocrit 36.0 - 46.0 % 41.2  43.9  48.0   Platelets 150 - 400 K/uL 188  161      Lab Results  Component Value Date   CHOL 293 (H) 04/19/2023   HDL 53 04/19/2023   LDLCALC 224 (H) 04/19/2023   TRIG 78 04/19/2023   CHOLHDL 5.5 04/19/2023    Lab Results  Component Value Date   TSH 0.787 08/03/2023    Lab Results  Component Value Date   HGBA1C 6.1 (H) 08/05/2023    Cardiac Panel (last 3 results) No results for input(s): CKTOTAL, CKMB, TROPONINIHS, RELINDX in the last 72 hours.  BNP (last 3 results) Recent Labs    11/21/23 1224 12/14/23 2130 12/18/23 0430  BNP 1,483.7* 7,053.7* 3,806.1*    Intake/Output Summary (Last 24 hours) at 12/18/2023 1256 Last data filed at 12/17/2023 2100 Gross per 24 hour  Intake --  Output 800 ml  Net -800 ml    Net IO Since Admission:  -1,250 mL [12/18/23 1256]   Tele/EKG/Cardiac studies    Normal sinus rhythm on telemetry.  EKG:  EKG 12/15/2023: Underlying baseline artifact due to electronic interference.  Normal sinus rhythm, left anterior fascicular block.  Incomplete right bundle branch block.  LVH.  No further analysis.  Echocardiogram 12/15/2023:  1. Left ventricular ejection fraction, by estimation, is <20%. The left ventricle has severely decreased function. The left ventricle demonstrates global hypokinesis. The left ventricular internal cavity size was dilated. Left ventricular diastolic parameters are consistent with Grade II diastolic dysfunction (pseudonormalization).  2. Right ventricular systolic function moderate to severely reduced. The right ventricular size is normal.  3. Left atrial size was grossly normal.  4. Right atrial size was grossly normal.  5. The mitral valve is grossly normal. Mild mitral valve regurgitation. No evidence of mitral stenosis.  6. The aortic valve was not well visualized. Aortic valve regurgitation is moderate. Aortic valve sclerosis is present, with no evidence of aortic valve stenosis.  7. The inferior vena cava is normal in size with greater than 50% respiratory variability, suggesting right atrial pressure of 3 mmHg.   Comparison(s): A prior study was performed on 09/26/2023. LVEF 20-25%, global hypokinesis, moderately dilated ventricle, grade 2 diastolic dysfunction, RVSP 36 mmHg, see report for additional details.   Conclusion(s)/Recommendation(s): No left ventricular mural or apical thrombus/thrombi.   Radiology    Current Meds:     Current Facility-Administered Medications:    acetaminophen  (TYLENOL ) tablet 650 mg, 650 mg, Oral, Q6H PRN **OR** acetaminophen  (TYLENOL ) suppository 650 mg, 650 mg, Rectal, Q6H PRN, Sundil, Subrina, MD   ALPRAZolam  (XANAX ) tablet 0.25 mg, 0.25 mg, Oral, TID PRN, Ghimire, Donalda HERO, MD, 0.25 mg at 12/17/23 1724   apixaban  (ELIQUIS )  tablet 2.5 mg, 2.5 mg, Oral, BID, Sundil, Subrina, MD, 2.5 mg at 12/18/23 0903   atorvastatin  (LIPITOR )  tablet 80 mg, 80 mg, Oral, Daily, Sundil, Subrina, MD, 80 mg at 12/18/23 0903   budesonide  (PULMICORT ) nebulizer solution 0.25 mg, 0.25 mg, Nebulization, BID, Ghimire, Donalda HERO, MD, 0.25 mg at 12/18/23 9268   cyclobenzaprine  (FLEXERIL ) tablet 10 mg, 10 mg, Oral, TID PRN, Sundil, Subrina, MD   dapagliflozin  propanediol (FARXIGA ) tablet 10 mg, 10 mg, Oral, Daily, Ghimire, Donalda HERO, MD, 10 mg at 12/18/23 9095   digoxin  (LANOXIN ) tablet 125 mcg, 125 mcg, Oral, Daily, Sundil, Subrina, MD, 125 mcg at 12/18/23 9096   feeding supplement (ENSURE ENLIVE / ENSURE PLUS) liquid 237 mL, 237 mL, Oral, TID BM, Ghimire, Donalda HERO, MD, 237 mL at 12/18/23 9095   guaiFENesin -dextromethorphan  (ROBITUSSIN DM) 100-10 MG/5ML syrup 5 mL, 5 mL, Oral, Q6H, Sundil, Subrina, MD, 5 mL at 12/18/23 1116   ipratropium-albuterol  (DUONEB) 0.5-2.5 (3) MG/3ML nebulizer solution 3 mL, 3 mL, Nebulization, Q6H, Sundil, Subrina, MD, 3 mL at 12/18/23 0730   isosorbide -hydrALAZINE  (BIDIL ) 20-37.5 MG per tablet 1 tablet, 1 tablet, Oral, TID, Dwayna Kentner, MD   levalbuterol  (XOPENEX ) nebulizer solution 0.63 mg, 0.63 mg, Nebulization, Q4H PRN, Sundil, Subrina, MD   midodrine  (PROAMATINE ) tablet 5 mg, 5 mg, Oral, TID WC, Tieler Cournoyer, MD   multivitamin with minerals tablet 1 tablet, 1 tablet, Oral, Daily, Ghimire, Donalda HERO, MD, 1 tablet at 12/18/23 9096   pantoprazole  (PROTONIX ) EC tablet 40 mg, 40 mg, Oral, Daily, Sundil, Subrina, MD, 40 mg at 12/18/23 9096   potassium chloride  (KLOR-CON ) packet 40 mEq, 40 mEq, Oral, Daily, Fairy Frames, MD, 40 mEq at 12/18/23 9096   predniSONE  (DELTASONE ) tablet 40 mg, 40 mg, Oral, Q breakfast, Ghimire, Donalda HERO, MD   senna-docusate (Senokot-S) tablet 1 tablet, 1 tablet, Oral, QHS PRN, Sundil, Subrina, MD   spironolactone  (ALDACTONE ) tablet 12.5 mg, 12.5 mg, Oral, Daily, Lenny Fiumara, MD   torsemide   (DEMADEX ) tablet 80 mg, 80 mg, Oral, BID, Ghimire, Donalda HERO, MD  Assessment & Plan .     1.  Acute hypoxemic and hypercarbic respiratory failure secondary to viral pneumonia and acute on chronic HFrEF 2.  Acute on chronic renal insufficiency stage IIIb 3.  Acute COPD exacerbation 4.  Ischemic cardiomyopathy with severe LV systolic dysfunction, CAD out of proportion to cardiomyopathy. 5.  Chronic cor pulmonale with RV failure second underlying COPD  Recommendation: Patient although is in heart failure, appears to be diuresing well with present medical regimen and I do not think her I's and O's are well-documented.  Continue diuresis for now with torsemide  80 mg twice daily, Farxiga  10 mg daily, follow-up on BMP and BNP tomorrow.  Continue low-dose of digoxin  as well.  Upon review, serum creatinine has been baseline between 1.4-1.9.  Hence I would like to challenge her with low-dose of spironolactone  at 12.5 mg daily and also start her on BiDil  1 p.o. twice daily with parameters to hold for systolic blood pressure <90 mmHg.  I will reduce the dose of midodrine  to 5 mg 3 times daily from 10 mg 3 times daily.  She has been on Corlanor  at maximum dose and in spite of this, heart rate is >70 bpm and hence ineffective.  Will discontinue this.  She is DNR and is appropriate for palliative care as well.  No escalation of therapy including inotropic therapy.  For questions or updates, please contact [facility] Please consult [email or web address] for contact info under        Signed, Gordy Bergamo, MD, [facility] 12/18/2023, 12:56 PM [facility] [address] [place], [place] [address] Phone: [phone]. Fax:  [phone]  Cell: [phone]

## 2023-12-18 NOTE — Progress Notes (Signed)
 PROGRESS NOTE        PATIENT DETAILS Name: Brittany [name]  Age: 63 y.o. Sex: female Date of Birth: Sep 23, 1961 Admit Date: 12/14/2023 Admitting Physician Micaela Speaker, MD ERE:Tpodnw, Raguel, MD  Brief Summary: Patient is a 63 y.o.  female with history of COPD on home O2, HFrEF, AICD implantation, PAF, CKD stage IIIb-presented with worsening shortness of breath-found to have acute on chronic hypoxic/hypercarbic respiratory failure in the setting of COPD/HFrEF exacerbation-due to RSV/coronavirus infection.  Significant events: 1/3>> admit to [facility]-acute on chronic hypoxic respiratory failure-CHF/COPD exacerbation-+ RSV/coronavirus +ve on RVP 1/5>> worsening AKI-diuretics held 1/6>> worsening shortness of breath-on BiPAP most of the day.  Cardiology consulted.  Significant studies: 1/3>> CXR: No PNA 1/4>> Limited echo: EF <20%, RV systolic function moderate to severely reduced.  Significant microbiology data: 1/3>> COVID/influenza/RSV PCR: Negative 1/4>> respiratory virus panel:+ve for coronavirus OC43, RSV 1/4>> blood cultures: No growth  Procedures: None  Consults: None  Subjective: Better today-Down to 3-4 L of oxygen.  Objective: Vitals: Blood pressure 112/61, pulse 73, temperature 97.6 F (36.4 C), temperature source Oral, resp. rate 16, height 4' 11 (1.499 m), weight 49.1 kg, SpO2 97%.   Exam: Gen Exam:Alert awake-not in any distress HEENT:atraumatic, normocephalic Chest: Bibasilar rales-few scattered rhonchi. CVS:S1S2 regular Abdomen:soft non tender, non distended Extremities:no edema Neurology: Non focal Skin: no rash  Pertinent Labs/Radiology:    Latest Ref Rng & Units 12/16/2023    5:43 AM 12/15/2023    2:08 AM 12/14/2023   10:58 PM  CBC  WBC 4.0 - 10.5 K/uL 14.3  15.1    Hemoglobin 12.0 - 15.0 g/dL 86.2  85.8  83.6   Hematocrit 36.0 - 46.0 % 41.2  43.9  48.0   Platelets 150 - 400 K/uL 188  161      Lab Results   Component Value Date   NA 135 12/18/2023   K 4.1 12/18/2023   CL 89 (L) 12/18/2023   CO2 29 12/18/2023     Assessment/Plan: Acute on chronic hypoxic/hypercarbic respiratory failure (on home O2-2-3 L) Secondary to COPD/CHF exacerbation Better today-was on BiPAP for most of the day yesterday-currently stable on 3-4 L of oxygen. Continue diuretics/bronchodilators  COPD exacerbation secondary to coronavirus/RSV infection Continues to have some scattered rhonchi but overall stable Completed steroids x 5 days Continue scheduled bronchodilators Continue antitussives Incentive spirometry/flutter valve Mobilize with nursing//PT/OT.    Acute on chronic HFrEF AICD in place Developed worsening hypoxia yesterday-was on BiPAP most of the day-better today-titrated to 3-4 L of oxygen Appears to have very advanced-likely end-stage CHF Repeat 1 dose of IV Lasix  today-and will see if she can go back to her regular regimen of Demadex /Aldactone /Farxiga  Remains on digoxin /Corlanor  Cardiology following.  AKI on CKD stage IIIb AKI hemodynamically mediated-possibly related to low flow state/cardiorenal syndrome Renal failure has stabilized-creatinine close to baseline Seems to be tolerating resumption of diuretics very well Continue midodrine  Avoid nephrotoxic agents.   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 Understands tenuous overall situation-frail  hemodynamics.  Underweight: Estimated body mass index is 21.86 kg/m as calculated from the following:   Height as of this encounter: 4' 11 (1.499 m).   Weight as of this encounter: 49.1 kg.   Code status:   Code Status: Limited: Do not attempt resuscitation (DNR) -DNR-LIMITED -Do Not Intubate/DNI    DVT Prophylaxis: apixaban  (ELIQUIS ) tablet 2.5 mg Start: 12/15/23 0100 SCDs Start: 12/15/23 0059 Place TED hose Start: 12/15/23 0059 apixaban  (ELIQUIS ) tablet 2.5 mg    Family Communication: None at bedside   Disposition Plan: Status is: Inpatient Remains inpatient appropriate because: Severity of illness   Planned Discharge Destination:Home health   Diet: Diet Order             Diet regular Room service appropriate? Yes with Assist; Fluid consistency: Thin  Diet effective now                     Antimicrobial agents: Anti-infectives (From admission, onward)    Start     Dose/Rate Route Frequency Ordered Stop   12/16/23 0000  cefTRIAXone  (ROCEPHIN ) 2 g in sodium chloride  0.9 % 100 mL IVPB  Status:  Discontinued        2 g 200 mL/hr over 30 Minutes Intravenous Every 24 hours 12/15/23 0058 12/15/23 2117   12/15/23 0200  doxycycline  (VIBRAMYCIN ) 100 mg in dextrose  5 % 250 mL IVPB  Status:  Discontinued        100 mg 125 mL/hr over 120 Minutes Intravenous Every 12 hours 12/15/23 0105 12/15/23 1038   12/15/23 0145  cefTRIAXone  (ROCEPHIN ) 2 g in sodium chloride  0.9 % 100 mL IVPB        2 g 200 mL/hr over 30 Minutes Intravenous  Once 12/15/23 0135 12/15/23 0259   12/15/23 0100  azithromycin  (ZITHROMAX ) 500 mg in sodium chloride  0.9 % 250 mL IVPB  Status:  Discontinued        500 mg 250 mL/hr over 60 Minutes Intravenous Every 24 hours 12/15/23 0058 12/15/23 0104        MEDICATIONS: Scheduled Meds:  apixaban   2.5 mg Oral BID   atorvastatin   80 mg Oral Daily   budesonide  (PULMICORT ) nebulizer solution  0.25 mg Nebulization BID   dapagliflozin  propanediol  10 mg  Oral Daily   digoxin   125 mcg Oral Daily   feeding supplement  237 mL Oral TID BM   furosemide   80 mg Intravenous Once   guaiFENesin -dextromethorphan   5 mL Oral Q6H   ipratropium-albuterol   3 mL Nebulization Q6H   ivabradine   7.5 mg Oral BID WC   midodrine   10 mg Oral TID WC   multivitamin with minerals  1 tablet Oral Daily   pantoprazole   40 mg Oral Daily   potassium chloride   40 mEq Oral Daily   torsemide   80 mg Oral BID   Continuous Infusions: PRN Meds:.acetaminophen  **OR** acetaminophen , ALPRAZolam , cyclobenzaprine , levalbuterol , senna-docusate   I have personally reviewed following labs and imaging studies  LABORATORY DATA: CBC: Recent Labs  Lab 12/14/23 2130 12/14/23 2258 12/15/23 0208 12/16/23 0543  WBC 13.3*  --  15.1* 14.3*  NEUTROABS 10.4*  --   --   --   HGB 15.4* 16.3* 14.1 13.7  HCT 49.5* 48.0* 43.9 41.2  MCV 94.5  --  91.6 88.2  PLT 247  --  161 188    Basic Metabolic Panel: Recent Labs  Lab 12/14/23  2130 12/14/23 2258 12/15/23 0208 12/16/23 0543 12/17/23 0432 12/18/23 0430  NA 139 135 136 134* 133* 135  K 3.4* 5.7* 3.0* 3.7 4.2 4.1  CL 99  --  97* 93* 95* 89*  CO2 20*  --  23 24 25 29   GLUCOSE 263*  --  237* 209* 257* 311*  BUN 20  --  23 51* 59* 63*  CREATININE 1.67*  --  1.74* 2.30* 1.74* 1.86*  CALCIUM  10.1  --  9.5 10.1 10.5* 10.8*    GFR: Estimated Creatinine Clearance: 21.1 mL/min (A) (by C-G formula based on SCr of 1.86 mg/dL (H)).  Liver Function Tests: Recent Labs  Lab 12/14/23 2130 12/15/23 0208  AST 33 30  ALT 17 18  ALKPHOS 102 93  BILITOT 1.3* 1.0  PROT 8.2* 7.7  ALBUMIN  4.3 3.9   No results for input(s): LIPASE, AMYLASE in the last 168 hours. No results for input(s): AMMONIA in the last 168 hours.  Coagulation Profile: No results for input(s): INR, PROTIME in the last 168 hours.  Cardiac Enzymes: No results for input(s): CKTOTAL, CKMB, CKMBINDEX, TROPONINI in the last 168 hours.  BNP (last 3  results) No results for input(s): PROBNP in the last 8760 hours.  Lipid Profile: No results for input(s): CHOL, HDL, LDLCALC, TRIG, CHOLHDL, LDLDIRECT in the last 72 hours.  Thyroid  Function Tests: No results for input(s): TSH, T4TOTAL, FREET4, T3FREE, THYROIDAB in the last 72 hours.  Anemia Panel: No results for input(s): VITAMINB12, FOLATE, FERRITIN, TIBC, IRON, RETICCTPCT in the last 72 hours.  Urine analysis:    Component Value Date/Time   COLORURINE YELLOW 05/23/2023 1519   APPEARANCEUR CLEAR 05/23/2023 1519   LABSPEC 1.013 05/23/2023 1519   PHURINE 6.0 05/23/2023 1519   GLUCOSEU NEGATIVE 05/23/2023 1519   HGBUR NEGATIVE 05/23/2023 1519   BILIRUBINUR NEGATIVE 05/23/2023 1519   KETONESUR NEGATIVE 05/23/2023 1519   PROTEINUR NEGATIVE 05/23/2023 1519   UROBILINOGEN 1.0 03/15/2015 1117   NITRITE NEGATIVE 05/23/2023 1519   LEUKOCYTESUR NEGATIVE 05/23/2023 1519    Sepsis Labs: Lactic Acid, Venous    Component Value Date/Time   LATICACIDVEN 1.3 08/04/2023 1111    MICROBIOLOGY: Recent Results (from the past 240 hours)  Resp panel by RT-PCR (RSV, Flu A&B, Covid) Anterior Nasal Swab     Status: None   Collection Time: 12/14/23  9:31 PM   Specimen: Anterior Nasal Swab  Result Value Ref Range Status   SARS Coronavirus 2 by RT PCR NEGATIVE NEGATIVE Final   Influenza A by PCR NEGATIVE NEGATIVE Final   Influenza B by PCR NEGATIVE NEGATIVE Final    Comment: (NOTE) The Xpert Xpress SARS-CoV-2/FLU/RSV plus assay is intended as an aid in the diagnosis of influenza from Nasopharyngeal swab specimens and should not be used as a sole basis for treatment. Nasal washings and aspirates are unacceptable for Xpert Xpress SARS-CoV-2/FLU/RSV testing.  Fact Sheet for Patients: bloggercourse.com  Fact Sheet for Healthcare Providers: seriousbroker.it  This test is not yet approved or cleared by the  [place]  FDA and has been authorized for detection and/or diagnosis of SARS-CoV-2 by FDA under an Emergency Use Authorization (EUA). This EUA will remain in effect (meaning this test can be used) for the duration of the COVID-19 declaration under Section 564(b)(1) of the Act, 21 U.S.C. section 360bbb-3(b)(1), unless the authorization is terminated or revoked.     Resp Syncytial Virus by PCR NEGATIVE NEGATIVE Final    Comment: (NOTE) Fact Sheet for Patients: bloggercourse.com  Fact Sheet for  Healthcare Providers: seriousbroker.it  This test is not yet approved or cleared by the [place]  FDA and has been authorized for detection and/or diagnosis of SARS-CoV-2 by FDA under an Emergency Use Authorization (EUA). This EUA will remain in effect (meaning this test can be used) for the duration of the COVID-19 declaration under Section 564(b)(1) of the Act, 21 U.S.C. section 360bbb-3(b)(1), unless the authorization is terminated or revoked.  Performed at [facility], [address], [place], [place] [address]   Respiratory (~20 pathogens) panel by PCR     Status: Abnormal   Collection Time: 12/15/23  1:00 AM   Specimen: Nasopharyngeal Swab; Respiratory  Result Value Ref Range Status   Adenovirus NOT DETECTED NOT DETECTED Final   Coronavirus 229E NOT DETECTED NOT DETECTED Final    Comment: (NOTE) The Coronavirus on the Respiratory Panel, DOES NOT test for the novel  Coronavirus (2019 nCoV)    Coronavirus HKU1 NOT DETECTED NOT DETECTED Final   Coronavirus NL63 NOT DETECTED NOT DETECTED Final   Coronavirus OC43 DETECTED (A) NOT DETECTED Final   Metapneumovirus NOT DETECTED NOT DETECTED Final   Rhinovirus / Enterovirus NOT DETECTED NOT DETECTED Final   Influenza A NOT DETECTED NOT DETECTED Final   Influenza B NOT DETECTED NOT DETECTED Final   Parainfluenza Virus 1 NOT DETECTED NOT DETECTED Final   Parainfluenza Virus 2  NOT DETECTED NOT DETECTED Final   Parainfluenza Virus 3 NOT DETECTED NOT DETECTED Final   Parainfluenza Virus 4 NOT DETECTED NOT DETECTED Final   Respiratory Syncytial Virus DETECTED (A) NOT DETECTED Final   Bordetella pertussis NOT DETECTED NOT DETECTED Final   Bordetella Parapertussis NOT DETECTED NOT DETECTED Final   Chlamydophila pneumoniae NOT DETECTED NOT DETECTED Final   Mycoplasma pneumoniae NOT DETECTED NOT DETECTED Final    Comment: Performed at [facility], [address], [place], [place] [address]  Culture, blood (Routine X 2) w Reflex to ID Panel     Status: None (Preliminary result)   Collection Time: 12/15/23  1:05 AM   Specimen: BLOOD LEFT ARM  Result Value Ref Range Status   Specimen Description BLOOD LEFT ARM  Final   Special Requests   Final    BOTTLES DRAWN AEROBIC ONLY Blood Culture results may not be optimal due to an inadequate volume of blood received in culture bottles   Culture   Final    NO GROWTH 3 DAYS Performed at [facility], [address], [place], [place] [address]    Report Status PENDING  Incomplete  Culture, blood (Routine X 2) w Reflex to ID Panel     Status: None (Preliminary result)   Collection Time: 12/15/23  2:07 AM   Specimen: BLOOD RIGHT ARM  Result Value Ref Range Status   Specimen Description BLOOD RIGHT ARM  Final   Special Requests   Final    BOTTLES DRAWN AEROBIC AND ANAEROBIC Blood Culture adequate volume   Culture   Final    NO GROWTH 3 DAYS Performed at [facility], [address] [place], [place] [address]    Report Status PENDING  Incomplete    RADIOLOGY STUDIES/RESULTS: DG Chest Port 1 View Result Date: 12/18/2023 CLINICAL DATA:  [id number] with shortness of breath. EXAM: PORTABLE CHEST 1 VIEW COMPARISON:  Portable chest 12/14/2023 FINDINGS: 4:27 a.m. The cardiac size is upper limits of normal. Old CABG changes are redemonstrated well as a left chest single lead AICD, right chest battery with single  wire  extending up the right neck and off of the film at C7. The lungs are emphysematous but clear. No pleural effusion is seen. The mediastinum is normally outlined. Aortic atherosclerosis. No new osseous findings. IMPRESSION: 1. No evidence of acute chest disease. Emphysema. 2. Old CABG changes. 3. Aortic atherosclerosis. Electronically Signed   By: Francis Quam M.D.   On: 12/18/2023 07:49     LOS: 3 days   Donalda Applebaum, MD  [facility]    To contact the attending provider between 7A-7P or the covering provider during after hours 7P-7A, please log into the web site [email or web address] and access using universal [place] password for that web site. If you do not have the password, please call the hospital operator.  12/18/2023, 11:10 AM

## 2023-12-18 NOTE — Progress Notes (Signed)
 No ICM remote transmission received for 12/17/2023 due to current hospitalization and next ICM transmission scheduled for 12/24/2023.

## 2023-12-18 NOTE — Progress Notes (Signed)
 Chest vest CPT held for this round. Pt recently received a bath and is still SOB from the movement. RT will do CPT at a later time

## 2023-12-18 NOTE — Evaluation (Signed)
 Physical Therapy Evaluation Patient Details Name: Brittany [name]  MRN: [id number] DOB: 1961-07-05 Today's Date: 12/18/2023  History of Present Illness  Brittany [name]  is a 63 y.o. female brought to ED via EMS for management of acute respiratory distress. 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 Pt very irritated, frustrated and impulsive with decreased insight to safety. Pt constantly stating I know how to take care of myself. I've been in the hospital 30 times. I know what I'm doing. Pt with poor activity tolerance requiring frequent rest breaks. Pt requiring minA to ambulate to sink and could only bath 1 extremity at a time while in sitting due to onset of fatigue and difficulty breathing. Unsure of patients true home situation but suspect it isn't safe or optimal for her current medical condition but also know she is refusing SNF. Acute PT to cont to follow.         If plan is discharge home, recommend the following: A little help with walking and/or transfers;A little help with bathing/dressing/bathroom;Assist for transportation   Can travel by private vehicle        Equipment Recommendations BSC/3in1  Recommendations for Other Services       Functional Status Assessment Patient has had a recent decline in their functional status and demonstrates the ability to make significant improvements in function in a reasonable and predictable amount of time.     Precautions / Restrictions Precautions Precautions: Fall Precaution Comments: impulsive, SOB Restrictions Weight Bearing Restrictions Per Provider Order: No      Mobility  Bed Mobility Overal bed mobility:  Independent             General bed mobility comments: pt brought herself to EOB however did elevate HOB first    Transfers Overall transfer level: Needs assistance Equipment used: 1 person hand held assist Transfers: Sit to/from Stand Sit to Stand: Min assist           General transfer comment: minA to steady at pt desired to hold onto PTs hand instead of use RW    Ambulation/Gait Ambulation/Gait assistance: Min assist Gait Distance (Feet): 10 Feet Assistive device: 1 person hand held assist Gait Pattern/deviations: Step-through pattern, Decreased stride length, Trunk flexed Gait velocity: dec     General Gait Details: pt with 4/4 DOE, SPO2 at 92% on 4Lo2 via [place], pt unsteady wihtout AD requiring assist from PT  Stairs            Wheelchair Mobility     Tilt Bed    Modified Rankin (Stroke Patients Only)       Balance Overall balance assessment: Needs assistance Sitting-balance support: Feet supported, No upper extremity supported Sitting balance-Leahy Scale: Fair Sitting balance - Comments: able to don socks with increased time   Standing balance support: During functional activity, Single extremity supported Standing balance-Leahy Scale: Poor                               Pertinent Vitals/Pain Pain Assessment Pain Assessment: No/denies pain    Home Living Family/patient expects to be discharged to:: Private residence Living Arrangements: Alone Available Help at Discharge: Family;Friend(s);Available PRN/intermittently Type of Home: Other(Comment) Home Access: Level entry  Home Layout: One level Home Equipment: Rollator (4 wheels);Grab bars - toilet;Grab bars - tub/shower      Prior Function Prior Level of Function : Independent/Modified Independent;Driving             Mobility Comments: no AD for short distances, pt uses rollator if she is fatigued or has trouble breathing ADLs Comments: independent      Extremity/Trunk Assessment   Upper Extremity Assessment Upper Extremity Assessment: Generalized weakness    Lower Extremity Assessment Lower Extremity Assessment: Generalized weakness    Cervical / Trunk Assessment Cervical / Trunk Assessment: Normal (however frail)  Communication   Communication Communication: No apparent difficulties  Cognition Arousal: Alert Behavior During Therapy: Impulsive Overall Cognitive Status: No family/caregiver present to determine baseline cognitive functioning                                 General Comments: pt irritated by PTs questions repeatedly stating I know how to take care of myself, I've been in the hospital 30x this year, I know how to care for myself        General Comments General comments (skin integrity, edema, etc.): 4/4 DOE with amb to sink, pt adamant about giving self a bath however very difficult, SOB despite sitting and leaning on coutner    Exercises     Assessment/Plan    PT Assessment Patient needs continued PT services  PT Problem List Decreased strength;Decreased activity tolerance;Decreased balance;Decreased mobility       PT Treatment Interventions DME instruction;Gait training;Functional mobility training;Therapeutic activities;Therapeutic exercise    PT Goals (Current goals can be found in the Care Plan section)  Acute Rehab PT Goals Patient Stated Goal: home PT Goal Formulation: With patient Time For Goal Achievement: 01/01/24 Potential to Achieve Goals: Good    Frequency Min 1X/week     Co-evaluation               AM-PAC PT 6 Clicks Mobility  Outcome Measure Help needed turning from your back to your side while in a flat bed without using bedrails?: None Help needed moving from lying on your back to sitting on the side of a flat bed without using bedrails?: None Help needed moving to and from a bed to a chair (including a wheelchair)?: A Little Help needed standing up  from a chair using your arms (e.g., wheelchair or bedside chair)?: A Little Help needed to walk in hospital room?: A Lot Help needed climbing 3-5 steps with a railing? : A Lot 6 Click Score: 18    End of Session Equipment Utilized During Treatment: Oxygen Activity Tolerance: Patient limited by fatigue Patient left: in chair (with RN tech getting a bath) Nurse Communication: Mobility status PT Visit Diagnosis: Unsteadiness on feet (R26.81);Muscle weakness (generalized) (M62.81);Difficulty in walking, not elsewhere classified (R26.2)    Time: 8659-8591 PT Time Calculation (min) (ACUTE ONLY): 28 min   Charges:   PT Evaluation $PT Eval Low Complexity: 1 Low PT Treatments $Gait Training: 8-22 mins PT General Charges $$ ACUTE PT VISIT: 1 Visit         Norene Ames, PT, DPT Acute Rehabilitation Services Secure chat preferred Office #: [phone]   Norene CHRISTELLA Ames 12/18/2023, 3:09 PM

## 2023-12-18 NOTE — Progress Notes (Signed)
   Palliative Medicine Inpatient Follow Up Note HPI: 63 y.o. female  with past medical history of chronic HFrEF (EF 25%), AICD, COPD with chronic respiratory failure on 2 L home O2, paroxysmal atrial fibrillation, and history of small cell lung cancer s/p chemo and XRT in 2015. She presented to the ED on 12/14/2023 with respiratory distress, and is admitted with acute on chronic CHF, COPD exacerbation, and AKI.   Palliative Medicine was consulted for goals of care discussions.    Today's Discussion 12/18/2023  *Please note that this is a verbal dictation therefore any spelling or grammatical errors are due to the Dragon Medical One system interpretation.  Chart reviewed inclusive of vital signs, progress notes, laboratory results, and diagnostic images.   I met with Brittany [name] at bedside this morning. She is awake and alert. She shares that it has been tough being in the hospital these last few days. We reviewed the monotony of the hospital setting and how one days transitions to the next. [name] and I discussed her heart failure, COPD, and  viral pneumonia. Reviewed the measures which are being pursued to help patients present state inclusive of percussion therapy. nebulizer's, and diuretics.   Created space and opportunity for patient to explore thoughts feelings and fears regarding current medical situation. She shares that her great hope is to get better. Prior to this event she was felt to be functioning well for the most part - living along and autonomous with the exception of an aid coming in 3x weekly. She shares that she has support from his sister, [name] and son. [name] expresses that she still was going on date nights every Friday and is hopeful to get back to that. She expresses that her son is her biggest supporter.   Discussed the importance of mobility and nutritional intake. Reviewed the one day at a time mentality.   Questions and concerns addressed/Palliative Support Provided.    Objective Assessment: Vital Signs Vitals:   12/18/23 0415 12/18/23 0800  BP:    Pulse:    Resp:    Temp: 97.6 F (36.4 C) 97.6 F (36.4 C)  SpO2:      Intake/Output Summary (Last 24 hours) at 12/18/2023 1126 Last data filed at 12/17/2023 2100 Gross per 24 hour  Intake --  Output 800 ml  Net -800 ml   Last Weight  Most recent update: 12/18/2023  9:06 AM    Weight  49.1 kg (108 lb 3.9 oz)            Gen: Older AA F in mild distress HEENT: moist mucous membranes CV: Regular rate and rhythm  PULM:  On 3LPM [place],patient with limited sentences ABD: soft/nontender  EXT: No edema  Neuro: Alert and oriented x3   SUMMARY OF RECOMMENDATIONS   DNAR/DNI  Allowing time for outcomes  Goal(s) are to get back home  Ongoing PMT support  Time Spent: 35 ______________________________________________________________________________________ Brittany [name] Palliative Medicine Team Team Cell Phone: [phone] Please utilize secure chat with additional questions, if there is no response within 30 minutes please call the above phone number  Palliative Medicine Team providers are available by phone from 7am to 7pm daily and can be reached through the team cell phone.  Should this patient require assistance outside of these hours, please call the patient's attending physician.

## 2023-12-19 DIAGNOSIS — N170 Acute kidney failure with tubular necrosis: Secondary | ICD-10-CM | POA: Diagnosis not present

## 2023-12-19 DIAGNOSIS — N184 Chronic kidney disease, stage 4 (severe): Secondary | ICD-10-CM

## 2023-12-19 DIAGNOSIS — J441 Chronic obstructive pulmonary disease with (acute) exacerbation: Secondary | ICD-10-CM | POA: Diagnosis not present

## 2023-12-19 DIAGNOSIS — J9601 Acute respiratory failure with hypoxia: Secondary | ICD-10-CM | POA: Diagnosis not present

## 2023-12-19 DIAGNOSIS — I5041 Acute combined systolic (congestive) and diastolic (congestive) heart failure: Secondary | ICD-10-CM | POA: Diagnosis not present

## 2023-12-19 LAB — BASIC METABOLIC PANEL
Anion gap: 19 — ABNORMAL HIGH (ref 5–15)
BUN: 79 mg/dL — ABNORMAL HIGH (ref 8–23)
CO2: 31 mmol/L (ref 22–32)
Calcium: 10.6 mg/dL — ABNORMAL HIGH (ref 8.9–10.3)
Chloride: 83 mmol/L — ABNORMAL LOW (ref 98–111)
Creatinine, Ser: 1.99 mg/dL — ABNORMAL HIGH (ref 0.44–1.00)
GFR, Estimated: 28 mL/min — ABNORMAL LOW (ref >60)
Glucose, Bld: 428 mg/dL — ABNORMAL HIGH (ref 70–99)
Potassium: 4.7 mmol/L (ref 3.5–5.1)
Sodium: 133 mmol/L — ABNORMAL LOW (ref 135–145)

## 2023-12-19 LAB — GLUCOSE, CAPILLARY
Glucose-Capillary: 305 mg/dL — ABNORMAL HIGH (ref 70–99)
Glucose-Capillary: 323 mg/dL — ABNORMAL HIGH (ref 70–99)
Glucose-Capillary: 140 mg/dL — ABNORMAL HIGH (ref 70–99)
Glucose-Capillary: 356 mg/dL — ABNORMAL HIGH (ref 70–99)

## 2023-12-19 LAB — DIGOXIN LEVEL: Digoxin Level: 0.8 ng/mL (ref 0.8–2.0)

## 2023-12-19 LAB — BRAIN NATRIURETIC PEPTIDE: B Natriuretic Peptide: 3015.7 pg/mL — ABNORMAL HIGH (ref 0.0–100.0)

## 2023-12-19 MED ORDER — INSULIN ASPART 100 UNIT/ML IJ SOLN
0.0000 [IU] | Freq: Every day | INTRAMUSCULAR | Status: DC
Start: 2023-12-19 — End: 2023-12-22
  Administered 2023-12-19: 5 [IU] via SUBCUTANEOUS

## 2023-12-19 MED ORDER — SPIRONOLACTONE 25 MG PO TABS: 25.0000 mg | ORAL_TABLET | Freq: Every day | ORAL | Status: DC

## 2023-12-19 MED ORDER — SPIRONOLACTONE 12.5 MG HALF TABLET
12.5000 mg | ORAL_TABLET | Freq: Every day | ORAL | Status: DC
  Administered 2023-12-20: 12.5 mg via ORAL
  Filled 2023-12-19: qty 1

## 2023-12-19 MED ORDER — TORSEMIDE 20 MG PO TABS
80.0000 mg | ORAL_TABLET | Freq: Every day | ORAL | Status: DC
  Administered 2023-12-20 – 2023-12-22 (×3): 80 mg via ORAL
  Filled 2023-12-19 (×3): qty 4

## 2023-12-19 MED ORDER — INSULIN ASPART 100 UNIT/ML IJ SOLN
0.0000 [IU] | Freq: Three times a day (TID) | INTRAMUSCULAR | Status: DC
  Administered 2023-12-20: 7 [IU] via SUBCUTANEOUS
  Administered 2023-12-20 – 2023-12-21 (×2): 3 [IU] via SUBCUTANEOUS
  Administered 2023-12-21: 15 [IU] via SUBCUTANEOUS

## 2023-12-19 MED ORDER — IVABRADINE HCL 5 MG PO TABS
7.5000 mg | ORAL_TABLET | Freq: Two times a day (BID) | ORAL | Status: DC
  Administered 2023-12-19: 7.5 mg via ORAL
  Filled 2023-12-19: qty 1

## 2023-12-19 MED ORDER — SPIRONOLACTONE 12.5 MG HALF TABLET: 12.5000 mg | ORAL_TABLET | Freq: Once | ORAL | Status: DC

## 2023-12-19 MED ORDER — ISOSORB DINITRATE-HYDRALAZINE 20-37.5 MG PO TABS
1.0000 | ORAL_TABLET | Freq: Two times a day (BID) | ORAL | Status: DC
  Administered 2023-12-19 – 2023-12-20 (×4): 1 via ORAL
  Filled 2023-12-19 (×4): qty 1

## 2023-12-19 MED ORDER — INSULIN ASPART 100 UNIT/ML IJ SOLN
0.0000 [IU] | Freq: Three times a day (TID) | INTRAMUSCULAR | Status: DC
  Administered 2023-12-19: 2 [IU] via SUBCUTANEOUS
  Administered 2023-12-19: 11 [IU] via SUBCUTANEOUS

## 2023-12-19 NOTE — Inpatient Diabetes Management (Signed)
 Inpatient Diabetes Program Recommendations  AACE/ADA: New Consensus Statement on Inpatient Glycemic Control (2015)  Target Ranges:  Prepandial:   less than 140 mg/dL      Peak postprandial:   less than 180 mg/dL (1-2 hours)      Critically ill patients:  140 - 180 mg/dL   Lab Results  Component Value Date   GLUCAP 305 (H) 12/19/2023   HGBA1C 6.1 (H) 08/05/2023    Review of Glycemic Control  Latest Reference Range & Units 12/19/23 08:15  Glucose-Capillary 70 - 99 mg/dL 694 (H)  (H): Data is abnormally high Diabetes history: Type 2 DM Outpatient Diabetes medications: Farxiga  10 mg QD Current orders for Inpatient glycemic control: Novolog  0-15 units TID, Farxiga  10 mg QD  Inpatient Diabetes Program Recommendations:    If appropriate, could also consider adding Semglee  7 units every day.   Thanks, Tinnie Minus, MSN, RNC-OB Diabetes Coordinator [phone] (8a-5p)

## 2023-12-19 NOTE — Plan of Care (Signed)
   Problem: Clinical Measurements: Goal: Respiratory complications will improve Outcome: Progressing

## 2023-12-19 NOTE — Plan of Care (Signed)
  Problem: Clinical Measurements: Goal: Ability to maintain clinical measurements within normal limits will improve Outcome: Progressing Goal: Respiratory complications will improve Outcome: Progressing   Problem: Nutrition: Goal: Adequate nutrition will be maintained Outcome: Progressing   Problem: Education: Goal: Ability to demonstrate management of disease process will improve Outcome: Progressing

## 2023-12-19 NOTE — Progress Notes (Addendum)
 PROGRESS NOTE        PATIENT DETAILS Name: Brittany [name]  Age: 63 y.o. Sex: female Date of Birth: Mar 13, 1961 Admit Date: 12/14/2023 Admitting Physician Micaela Speaker, MD ERE:Tpodnw, Raguel, MD  Brief Summary: Patient is a 63 y.o.  female with history of COPD on home O2, HFrEF, AICD implantation, PAF, CKD stage IIIb-presented with worsening shortness of breath-found to have acute on chronic hypoxic/hypercarbic respiratory failure in the setting of COPD/HFrEF exacerbation-due to RSV/coronavirus infection.  Significant events: 1/3>> admit to [facility]-acute on chronic hypoxic respiratory failure-CHF/COPD exacerbation-+ RSV/coronavirus +ve on RVP 1/5>> worsening AKI-diuretics held 1/6>> worsening shortness of breath-on BiPAP most of the day.  Cardiology consulted.  Significant studies: 1/3>> CXR: No PNA 1/4>> Limited echo: EF <20%, RV systolic function moderate to severely reduced.  Significant microbiology data: 1/3>> COVID/influenza/RSV PCR: Negative 1/4>> respiratory virus panel:+ve for coronavirus OC43, RSV 1/4>> blood cultures: No growth  Procedures: None  Consults: Cards  Subjective: Feels much better-stable on 3 L of oxygen.  Objective: Vitals: Blood pressure 109/60, pulse 93, temperature 97.8 F (36.6 C), temperature source Oral, resp. rate (!) 23, height 4' 11 (1.499 m), weight 47.2 kg, SpO2 95%.   Exam: Gen Exam:Alert awake-not in any distress HEENT:atraumatic, normocephalic Chest: Few scattered rhonchi CVS:S1S2 regular Abdomen:soft non tender, non distended Extremities:no edema Neurology: Non focal Skin: no rash  Pertinent Labs/Radiology:    Latest Ref Rng & Units 12/16/2023    5:43 AM 12/15/2023    2:08 AM 12/14/2023   10:58 PM  CBC  WBC 4.0 - 10.5 K/uL 14.3  15.1    Hemoglobin 12.0 - 15.0 g/dL 86.2  85.8  83.6   Hematocrit 36.0 - 46.0 % 41.2  43.9  48.0   Platelets 150 - 400 K/uL 188  161      Lab Results  Component  Value Date   NA 133 (L) 12/19/2023   K 4.7 12/19/2023   CL 83 (L) 12/19/2023   CO2 31 12/19/2023     Assessment/Plan: Acute on chronic hypoxic/hypercarbic respiratory failure (on home O2-2-3 L) Secondary to COPD/CHF exacerbation Improving after treatment of underlying etiologies Down to 3 L of oxygen-continue diuretics/bronchodilators.  COPD exacerbation secondary to coronavirus/RSV infection Improving-only a few rhonchi today Continue scheduled bronchodilators Completed a course of steroids Incentive spirometry/flutter valve Continue to mobilize with nursing staff.  Acute on chronic HFrEF AICD in place Significant improvement in hypoxia/volume status of the past several days Cardiology has started BiDil -Demadex  changed to daily dosing Continue Farxiga /Aldactone  Remains on digoxin  Watch closely  AKI on CKD stage IIIb AKI hemodynamically mediated-possibly related to low flow state/cardiorenal syndrome Renal failure has stabilized-creatinine close to baseline Renal function relatively stable Cardiology titrating down midodrine .  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 Understands tenuous overall situation-frail hemodynamics.  Nutrition Status: Nutrition Problem: Severe Malnutrition Etiology: chronic illness Signs/Symptoms: meal completion < 25%, severe fat depletion, severe muscle depletion Interventions: Ensure Enlive (each supplement provides 350kcal  and 20 grams of protein), Liberalize Diet,  MVI    Underweight: Estimated body mass index is 21.02 kg/m as calculated from the following:   Height as of this encounter: 4' 11 (1.499 m).   Weight as of this encounter: 47.2 kg.   Code status:   Code Status: Limited: Do not attempt resuscitation (DNR) -DNR-LIMITED -Do Not Intubate/DNI    DVT Prophylaxis: apixaban  (ELIQUIS ) tablet 2.5 mg Start: 12/15/23 0100 SCDs Start: 12/15/23 0059 Place TED hose Start: 12/15/23 0059 apixaban  (ELIQUIS ) tablet 2.5 mg    Family Communication: None at bedside   Disposition Plan: Status is: Inpatient Remains inpatient appropriate because: Severity of illness   Planned Discharge Destination:Home health   Diet: Diet Order             Diet heart healthy/carb modified Fluid consistency: Thin; Fluid restriction: 1200 mL Fluid  Diet effective now                     Antimicrobial agents: Anti-infectives (From admission, onward)    Start     Dose/Rate Route Frequency Ordered Stop   12/16/23 0000  cefTRIAXone  (ROCEPHIN ) 2 g in sodium chloride  0.9 % 100 mL IVPB  Status:  Discontinued        2 g 200 mL/hr over 30 Minutes Intravenous Every 24 hours 12/15/23 0058 12/15/23 2117   12/15/23 0200  doxycycline  (VIBRAMYCIN ) 100 mg in dextrose  5 % 250 mL IVPB  Status:  Discontinued        100 mg 125 mL/hr over 120 Minutes Intravenous Every 12 hours 12/15/23 0105 12/15/23 1038   12/15/23 0145  cefTRIAXone  (ROCEPHIN ) 2 g in sodium chloride  0.9 % 100 mL IVPB        2 g 200 mL/hr over 30 Minutes Intravenous  Once 12/15/23 0135 12/15/23 0259   12/15/23 0100  azithromycin  (ZITHROMAX ) 500 mg in sodium chloride  0.9 % 250 mL IVPB  Status:  Discontinued        500 mg 250 mL/hr over 60 Minutes Intravenous Every 24 hours 12/15/23 0058 12/15/23 0104        MEDICATIONS: Scheduled Meds:  apixaban   2.5 mg Oral BID   atorvastatin   80 mg Oral Daily   budesonide  (PULMICORT ) nebulizer solution  0.25 mg Nebulization BID   dapagliflozin  propanediol  10  mg Oral Daily   digoxin   125 mcg Oral Daily   feeding supplement  237 mL Oral TID BM   guaiFENesin -dextromethorphan   5 mL Oral Q6H   insulin  aspart  0-15 Units Subcutaneous TID WC   ipratropium-albuterol   3 mL Nebulization Q6H   isosorbide -hydrALAZINE   1 tablet Oral BID   midodrine   5 mg Oral TID WC   multivitamin with minerals  1 tablet Oral Daily   pantoprazole   40 mg Oral Daily   potassium chloride   40 mEq Oral Daily   [START ON 12/20/2023] spironolactone   12.5 mg Oral Daily   [START ON 12/20/2023] torsemide   80 mg Oral Daily   Continuous Infusions: PRN Meds:.acetaminophen  **OR** acetaminophen , ALPRAZolam , cyclobenzaprine , levalbuterol , senna-docusate   I have personally reviewed following labs and imaging studies  LABORATORY DATA: CBC: Recent Labs  Lab 12/14/23 2130 12/14/23 2258 12/15/23 0208 12/16/23 0543  WBC 13.3*  --  15.1* 14.3*  NEUTROABS 10.4*  --   --   --   HGB 15.4* 16.3* 14.1 13.7  HCT 49.5* 48.0* 43.9 41.2  MCV 94.5  --  91.6 88.2  PLT 247  --  161 188  Basic Metabolic Panel: Recent Labs  Lab 12/15/23 0208 12/16/23 0543 12/17/23 0432 12/18/23 0430 12/19/23 0509  NA 136 134* 133* 135 133*  K 3.0* 3.7 4.2 4.1 4.7  CL 97* 93* 95* 89* 83*  CO2 23 24 25 29 31   GLUCOSE 237* 209* 257* 311* 428*  BUN 23 51* 59* 63* 79*  CREATININE 1.74* 2.30* 1.74* 1.86* 1.99*  CALCIUM  9.5 10.1 10.5* 10.8* 10.6*    GFR: Estimated Creatinine Clearance: 19.7 mL/min (A) (by C-G formula based on SCr of 1.99 mg/dL (H)).  Liver Function Tests: Recent Labs  Lab 12/14/23 2130 12/15/23 0208  AST 33 30  ALT 17 18  ALKPHOS 102 93  BILITOT 1.3* 1.0  PROT 8.2* 7.7  ALBUMIN  4.3 3.9   No results for input(s): LIPASE, AMYLASE in the last 168 hours. No results for input(s): AMMONIA in the last 168 hours.  Coagulation Profile: No results for input(s): INR, PROTIME in the last 168 hours.  Cardiac Enzymes: No results for input(s): CKTOTAL, CKMB,  CKMBINDEX, TROPONINI in the last 168 hours.  BNP (last 3 results) No results for input(s): PROBNP in the last 8760 hours.  Lipid Profile: No results for input(s): CHOL, HDL, LDLCALC, TRIG, CHOLHDL, LDLDIRECT in the last 72 hours.  Thyroid  Function Tests: No results for input(s): TSH, T4TOTAL, FREET4, T3FREE, THYROIDAB in the last 72 hours.  Anemia Panel: No results for input(s): VITAMINB12, FOLATE, FERRITIN, TIBC, IRON, RETICCTPCT in the last 72 hours.  Urine analysis:    Component Value Date/Time   COLORURINE YELLOW 05/23/2023 1519   APPEARANCEUR CLEAR 05/23/2023 1519   LABSPEC 1.013 05/23/2023 1519   PHURINE 6.0 05/23/2023 1519   GLUCOSEU NEGATIVE 05/23/2023 1519   HGBUR NEGATIVE 05/23/2023 1519   BILIRUBINUR NEGATIVE 05/23/2023 1519   KETONESUR NEGATIVE 05/23/2023 1519   PROTEINUR NEGATIVE 05/23/2023 1519   UROBILINOGEN 1.0 03/15/2015 1117   NITRITE NEGATIVE 05/23/2023 1519   LEUKOCYTESUR NEGATIVE 05/23/2023 1519    Sepsis Labs: Lactic Acid, Venous    Component Value Date/Time   LATICACIDVEN 1.3 08/04/2023 1111    MICROBIOLOGY: Recent Results (from the past 240 hours)  Resp panel by RT-PCR (RSV, Flu A&B, Covid) Anterior Nasal Swab     Status: None   Collection Time: 12/14/23  9:31 PM   Specimen: Anterior Nasal Swab  Result Value Ref Range Status   SARS Coronavirus 2 by RT PCR NEGATIVE NEGATIVE Final   Influenza A by PCR NEGATIVE NEGATIVE Final   Influenza B by PCR NEGATIVE NEGATIVE Final    Comment: (NOTE) The Xpert Xpress SARS-CoV-2/FLU/RSV plus assay is intended as an aid in the diagnosis of influenza from Nasopharyngeal swab specimens and should not be used as a sole basis for treatment. Nasal washings and aspirates are unacceptable for Xpert Xpress SARS-CoV-2/FLU/RSV testing.  Fact Sheet for Patients: bloggercourse.com  Fact Sheet for Healthcare  Providers: seriousbroker.it  This test is not yet approved or cleared by the [place]  FDA and has been authorized for detection and/or diagnosis of SARS-CoV-2 by FDA under an Emergency Use Authorization (EUA). This EUA will remain in effect (meaning this test can be used) for the duration of the COVID-19 declaration under Section 564(b)(1) of the Act, 21 U.S.C. section 360bbb-3(b)(1), unless the authorization is terminated or revoked.     Resp Syncytial Virus by PCR NEGATIVE NEGATIVE Final    Comment: (NOTE) Fact Sheet for Patients: bloggercourse.com  Fact Sheet for Healthcare Providers: seriousbroker.it  This test is not yet approved or cleared by the  [place]  FDA and has been authorized for detection and/or diagnosis of SARS-CoV-2 by FDA under an Emergency Use Authorization (EUA). This EUA will remain in effect (meaning this test can be used) for the duration of the COVID-19 declaration under Section 564(b)(1) of the Act, 21 U.S.C. section 360bbb-3(b)(1), unless the authorization is terminated or revoked.  Performed at [facility], [address], [place], [place] [address]   Respiratory (~20 pathogens) panel by PCR     Status: Abnormal   Collection Time: 12/15/23  1:00 AM   Specimen: Nasopharyngeal Swab; Respiratory  Result Value Ref Range Status   Adenovirus NOT DETECTED NOT DETECTED Final   Coronavirus 229E NOT DETECTED NOT DETECTED Final    Comment: (NOTE) The Coronavirus on the Respiratory Panel, DOES NOT test for the novel  Coronavirus (2019 nCoV)    Coronavirus HKU1 NOT DETECTED NOT DETECTED Final   Coronavirus NL63 NOT DETECTED NOT DETECTED Final   Coronavirus OC43 DETECTED (A) NOT DETECTED Final   Metapneumovirus NOT DETECTED NOT DETECTED Final   Rhinovirus / Enterovirus NOT DETECTED NOT DETECTED Final   Influenza A NOT DETECTED NOT DETECTED Final   Influenza B NOT  DETECTED NOT DETECTED Final   Parainfluenza Virus 1 NOT DETECTED NOT DETECTED Final   Parainfluenza Virus 2 NOT DETECTED NOT DETECTED Final   Parainfluenza Virus 3 NOT DETECTED NOT DETECTED Final   Parainfluenza Virus 4 NOT DETECTED NOT DETECTED Final   Respiratory Syncytial Virus DETECTED (A) NOT DETECTED Final   Bordetella pertussis NOT DETECTED NOT DETECTED Final   Bordetella Parapertussis NOT DETECTED NOT DETECTED Final   Chlamydophila pneumoniae NOT DETECTED NOT DETECTED Final   Mycoplasma pneumoniae NOT DETECTED NOT DETECTED Final    Comment: Performed at [facility], [address], [place], [place] [address]  Culture, blood (Routine X 2) w Reflex to ID Panel     Status: None (Preliminary result)   Collection Time: 12/15/23  1:05 AM   Specimen: BLOOD LEFT ARM  Result Value Ref Range Status   Specimen Description BLOOD LEFT ARM  Final   Special Requests   Final    BOTTLES DRAWN AEROBIC ONLY Blood Culture results may not be optimal due to an inadequate volume of blood received in culture bottles   Culture   Final    NO GROWTH 4 DAYS Performed at [facility], [address] [place], [place] [address]    Report Status PENDING  Incomplete  Culture, blood (Routine X 2) w Reflex to ID Panel     Status: None (Preliminary result)   Collection Time: 12/15/23  2:07 AM   Specimen: BLOOD RIGHT ARM  Result Value Ref Range Status   Specimen Description BLOOD RIGHT ARM  Final   Special Requests   Final    BOTTLES DRAWN AEROBIC AND ANAEROBIC Blood Culture adequate volume   Culture   Final    NO GROWTH 4 DAYS Performed at [facility], [address], [place], [place] [address]    Report Status PENDING  Incomplete    RADIOLOGY STUDIES/RESULTS: DG Chest Port 1 View Result Date: 12/18/2023 CLINICAL DATA:  [id number] with shortness of breath. EXAM: PORTABLE CHEST 1 VIEW COMPARISON:  Portable chest 12/14/2023 FINDINGS: 4:27 a.m. The cardiac size is upper limits of normal. Old  CABG changes are redemonstrated well as a left chest single lead AICD, right chest battery with single wire extending up the right neck and off of the film at C7.  The lungs are emphysematous but clear. No pleural effusion is seen. The mediastinum is normally outlined. Aortic atherosclerosis. No new osseous findings. IMPRESSION: 1. No evidence of acute chest disease. Emphysema. 2. Old CABG changes. 3. Aortic atherosclerosis. Electronically Signed   By: Francis Quam M.D.   On: 12/18/2023 07:49     LOS: 4 days   Donalda Applebaum, MD  [facility]    To contact the attending provider between 7A-7P or the covering provider during after hours 7P-7A, please log into the web site [email or web address] and access using universal [place] password for that web site. If you do not have the password, please call the hospital operator.  12/19/2023, 1:06 PM

## 2023-12-19 NOTE — Progress Notes (Signed)
 Patient Name: Brittany [name]  Date of Encounter: 12/19/2023 [facility] Cardiologist: None   Interval Summary  .    Remains on O2, sitting up eating breakfast. Productive cough.   Vital Signs .    Vitals:   12/18/23 2205 12/18/23 2345 12/19/23 0431 12/19/23 0500  BP:  (!) 102/51 116/72   Pulse: 91 94 87   Resp: 19 20 17    Temp:  97.9 F (36.6 C) 97.7 F (36.5 C)   TempSrc:  Oral Oral   SpO2: 95% 96% 96%   Weight:    47.2 kg  Height:        Intake/Output Summary (Last 24 hours) at 12/19/2023 0856 Last data filed at 12/18/2023 2100 Gross per 24 hour  Intake --  Output 2000 ml  Net -2000 ml      12/19/2023    5:00 AM 12/18/2023    7:08 AM 12/16/2023    4:19 AM  Last 3 Weights  Weight (lbs) 104 lb 0.9 oz 108 lb 3.9 oz 98 lb 15.8 oz  Weight (kg) 47.2 kg 49.1 kg 44.9 kg      Telemetry/ECG    Sinus Tachycardia (HR rates up more today) - Personally Reviewed  Physical Exam .   GEN: No acute distress.   Neck: No JVD Cardiac: Tachycardia, no murmurs, rubs, or gallops.  Respiratory: Clear to auscultation bilaterally. GI: Soft, nontender, non-distended  MS: No edema  Assessment & Plan .     63 y.o. female with a hx of chronic HFrEF status post ICD and Barostim, CAD status post CABG in 2019, PAF, hypertension, COPD, CKD, renal infarct 2022, small cell lung cancer treated with chemo, previous smoker who was seen 12/17/2023 for the evaluation of CHF at the request of Dr. Raenelle.   Acute on Chronic BiV CHF S/p Barostim S/p ICD  -- known LVEF of 20-25%, echo this admission with LVEF of 20% g2DD and moderate to severely reduced RV function -- Initially diuresed with IV Lasix , transition back to torsemide  80 mg twice daily -- Net -3.2 L -- GDMT: Previously on ivabradine , Farxiga , spironolactone , digoxin .  Ivabradine  was stopped yesterday.  She was transitioned to BiDil  1 tablet 3 times daily with midodrine  and redosed to 5 mg 3 times daily (previously 10 mg 3  times daily) -- With rising creatinine would hold spironolactone  12.5 mg, and dig. 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 Hold spiro today -- follow BMET  Paroxysmal atrial fibrillation -- in sinus rhythm, tachycardic this morning -- Hold dig this morning, check dig level with rising creatinine. Continue if level ok -- Continue Eliquis  2.5 mg twice daily  Per primary History of small cell lung CA OSA Renal infarct  Palliative care following, she is a DNR and does not desire aggressive measures.  Previously deemed not a candidate for advanced therapies.  For questions or updates, please contact [facility] Please consult [email or web address] for contact info under  Signed, Manuelita Rummer, NP

## 2023-12-19 NOTE — Progress Notes (Signed)
 CPT chest vest held for this round per pt's request. Pt wants to eat and is feeling a little SOB. Will attempt at a later time.

## 2023-12-19 NOTE — Progress Notes (Signed)
 Mobility Specialist Progress Note;   12/19/23 1000  Mobility  Activity Refused mobility   Pt refused mobility w/ max encouragement. Stated she cannot make it far w/o being out of breath. Encouraged pt to just do a little bit or even sit in chair, pt still refusing. Pt left in bed with all needs met. NP in room.   Lauraine Erm Mobility Specialist Please contact via SecureChat or [employer] [phone]

## 2023-12-19 NOTE — Progress Notes (Signed)
 Palliative Medicine Inpatient Follow Up Note HPI: 63 y.o. female  with past medical history of chronic HFrEF (EF 25%), AICD, COPD with chronic respiratory failure on 2 L home O2, paroxysmal atrial fibrillation, and history of small cell lung cancer s/p chemo and XRT in 2015. She presented to the ED on 12/14/2023 with respiratory distress, and is admitted with acute on chronic CHF, COPD exacerbation, and AKI.   Palliative Medicine was consulted for goals of care discussions.    Today's Discussion 12/19/2023  *Please note that this is a verbal dictation therefore any spelling or grammatical errors are due to the Dragon Medical One system interpretation.  Chart reviewed inclusive of vital signs, progress notes, laboratory results, and diagnostic images.   I met with Brittany [name] this morning. She shares frustrations at her present health. We discussed her various comorbid conditions and the fact that she has acute respiratory illness on top of this. I shared that it will likely take her time to recuperate. We reviewed that she has far less reserve than people in better health.  I did share that if at any point Brittany [name] elects to not choose present measures we can always transition her emphasis of care towards comfort / end of life measures.  Brittany [name] states that she is not at this point yet. She wants to ideally get better and get back home.   Allowed time for Brittany [name] to express herself. She shares the lab draws, constant interruptions, and beeping of the monitor are driving her crazy. I talked about minimizing interruptions within reason associated with patients care goals.  We discussed how patient has panic attacks when she goes to the bathroom in the morning. She shares that she removed her oxygen for a short period of time and become breathless quickly. I provided education that she should not remove her oxygen at any point. I went and got some oxygen extension tubes for her.  Brittany [name] was frustrated with  some of the additional care she was receiving which I was able to address with the charge nurse.    Questions and concerns addressed/Palliative Support Provided.   Objective Assessment: Vital Signs Vitals:   12/19/23 0431 12/19/23 0812  BP: 116/72 109/60  Pulse: 87 93  Resp: 17 (!) 23  Temp: 97.7 F (36.5 C) 97.8 F (36.6 C)  SpO2: 96% 95%    Intake/Output Summary (Last 24 hours) at 12/19/2023 1137 Last data filed at 12/18/2023 2100 Gross per 24 hour  Intake --  Output 1100 ml  Net -1100 ml   Last Weight  Most recent update: 12/19/2023  5:43 AM    Weight  47.2 kg (104 lb 0.9 oz)            Gen: Older AA F in mild distress HEENT: moist mucous membranes CV: Regular rate and rhythm  PULM:  On 2LPM [place],breathing no labored ABD: soft/nontender  EXT: No edema  Neuro: Alert and oriented x3   SUMMARY OF RECOMMENDATIONS   DNAR/DNI  Allowing time for outcomes  Goal(s) are to get back home  Ongoing PMT support --> Brittany [name] will be back tomorrow to resume care  Time Spent: 50 ______________________________________________________________________________________ Brittany [name] [place] [place] [facility] Palliative Medicine Team Team Cell Phone: [phone] Please utilize secure chat with additional questions, if there is no response within 30 minutes please call the above phone number  Palliative Medicine Team providers are available by phone from 7am to 7pm daily and can be reached through the team cell phone.  Should  this patient require assistance outside of these hours, please call the patient's attending physician.

## 2023-12-19 NOTE — Progress Notes (Signed)
 CBG 356, no HS coverage, pt on steroids. Margo Aye, MD notified.

## 2023-12-20 DIAGNOSIS — N170 Acute kidney failure with tubular necrosis: Secondary | ICD-10-CM | POA: Diagnosis not present

## 2023-12-20 DIAGNOSIS — I5041 Acute combined systolic (congestive) and diastolic (congestive) heart failure: Secondary | ICD-10-CM | POA: Diagnosis not present

## 2023-12-20 DIAGNOSIS — J9601 Acute respiratory failure with hypoxia: Secondary | ICD-10-CM | POA: Diagnosis not present

## 2023-12-20 DIAGNOSIS — J441 Chronic obstructive pulmonary disease with (acute) exacerbation: Secondary | ICD-10-CM | POA: Diagnosis not present

## 2023-12-20 LAB — GLUCOSE, CAPILLARY
Glucose-Capillary: 139 mg/dL — ABNORMAL HIGH (ref 70–99)
Glucose-Capillary: 116 mg/dL — ABNORMAL HIGH (ref 70–99)
Glucose-Capillary: 131 mg/dL — ABNORMAL HIGH (ref 70–99)
Glucose-Capillary: 221 mg/dL — ABNORMAL HIGH (ref 70–99)
Glucose-Capillary: 89 mg/dL (ref 70–99)
Glucose-Capillary: 89 mg/dL (ref 70–99)

## 2023-12-20 LAB — CULTURE, BLOOD (ROUTINE X 2)
Culture: NO GROWTH
Culture: NO GROWTH
Special Requests: ADEQUATE

## 2023-12-20 LAB — BASIC METABOLIC PANEL
Anion gap: 12 (ref 5–15)
BUN: 70 mg/dL — ABNORMAL HIGH (ref 8–23)
CO2: 35 mmol/L — ABNORMAL HIGH (ref 22–32)
Calcium: 10.3 mg/dL (ref 8.9–10.3)
Chloride: 88 mmol/L — ABNORMAL LOW (ref 98–111)
Creatinine, Ser: 1.6 mg/dL — ABNORMAL HIGH (ref 0.44–1.00)
GFR, Estimated: 36 mL/min — ABNORMAL LOW (ref >60)
Glucose, Bld: 133 mg/dL — ABNORMAL HIGH (ref 70–99)
Potassium: 3.2 mmol/L — ABNORMAL LOW (ref 3.5–5.1)
Sodium: 135 mmol/L (ref 135–145)

## 2023-12-20 LAB — MAGNESIUM: Magnesium: 2.2 mg/dL (ref 1.7–2.4)

## 2023-12-20 LAB — BRAIN NATRIURETIC PEPTIDE: B Natriuretic Peptide: 902 pg/mL — ABNORMAL HIGH (ref 0.0–100.0)

## 2023-12-20 MED ORDER — POTASSIUM CHLORIDE 20 MEQ PO PACK
40.0000 meq | PACK | Freq: Every day | ORAL | Status: DC
  Administered 2023-12-21 – 2023-12-22 (×2): 40 meq via ORAL
  Filled 2023-12-20 (×2): qty 2

## 2023-12-20 MED ORDER — SPIRONOLACTONE 25 MG PO TABS
25.0000 mg | ORAL_TABLET | Freq: Every day | ORAL | Status: DC
  Administered 2023-12-21 – 2023-12-22 (×2): 25 mg via ORAL
  Filled 2023-12-20 (×2): qty 1

## 2023-12-20 MED ORDER — DIGOXIN 125 MCG PO TABS
125.0000 ug | ORAL_TABLET | ORAL | Status: DC
  Administered 2023-12-22: 125 ug via ORAL
  Filled 2023-12-20: qty 1

## 2023-12-20 MED ORDER — ARFORMOTEROL TARTRATE 15 MCG/2ML IN NEBU
15.0000 ug | INHALATION_SOLUTION | Freq: Two times a day (BID) | RESPIRATORY_TRACT | Status: DC
  Administered 2023-12-20 – 2023-12-22 (×4): 15 ug via RESPIRATORY_TRACT
  Filled 2023-12-20 (×4): qty 2

## 2023-12-20 MED ORDER — POTASSIUM CHLORIDE CRYS ER 20 MEQ PO TBCR
40.0000 meq | EXTENDED_RELEASE_TABLET | ORAL | Status: AC
  Administered 2023-12-20 (×2): 40 meq via ORAL
  Filled 2023-12-20 (×2): qty 2

## 2023-12-20 MED ORDER — IVABRADINE HCL 5 MG PO TABS
7.5000 mg | ORAL_TABLET | Freq: Two times a day (BID) | ORAL | Status: DC
  Administered 2023-12-20 – 2023-12-22 (×4): 7.5 mg via ORAL
  Filled 2023-12-20 (×5): qty 1

## 2023-12-20 MED ORDER — IPRATROPIUM-ALBUTEROL 0.5-2.5 (3) MG/3ML IN SOLN
3.0000 mL | Freq: Three times a day (TID) | RESPIRATORY_TRACT | Status: DC
Start: 2023-12-20 — End: 2023-12-22
  Administered 2023-12-20 – 2023-12-22 (×7): 3 mL via RESPIRATORY_TRACT
  Filled 2023-12-20 (×8): qty 3

## 2023-12-20 MED ORDER — SPIRONOLACTONE 12.5 MG HALF TABLET
12.5000 mg | ORAL_TABLET | Freq: Once | ORAL | Status: AC
  Administered 2023-12-20: 12.5 mg via ORAL
  Filled 2023-12-20: qty 1

## 2023-12-20 MED ORDER — REVEFENACIN 175 MCG/3ML IN SOLN
175.0000 ug | Freq: Every day | RESPIRATORY_TRACT | Status: DC
  Administered 2023-12-21: 175 ug via RESPIRATORY_TRACT
  Filled 2023-12-20 (×2): qty 3

## 2023-12-20 NOTE — Plan of Care (Signed)
  Problem: Education: Goal: Knowledge of General Education information will improve Description: Including pain rating scale, medication(s)/side effects and non-pharmacologic comfort measures Outcome: Progressing   Problem: Health Behavior/Discharge Planning: Goal: Ability to manage health-related needs will improve Outcome: Progressing   Problem: Clinical Measurements: Goal: Ability to maintain clinical measurements within normal limits will improve Outcome: Progressing Goal: Will remain free from infection Outcome: Progressing Goal: Diagnostic test results will improve Outcome: Progressing Goal: Respiratory complications will improve Outcome: Progressing Goal: Cardiovascular complication will be avoided Outcome: Progressing   Problem: Activity: Goal: Risk for activity intolerance will decrease Outcome: Progressing   Problem: Nutrition: Goal: Adequate nutrition will be maintained Outcome: Progressing   Problem: Coping: Goal: Level of anxiety will decrease Outcome: Progressing   Problem: Elimination: Goal: Will not experience complications related to bowel motility Outcome: Progressing Goal: Will not experience complications related to urinary retention Outcome: Progressing   Problem: Pain Management: Goal: General experience of comfort will improve Outcome: Progressing   Problem: Safety: Goal: Ability to remain free from injury will improve Outcome: Progressing   Problem: Skin Integrity: Goal: Risk for impaired skin integrity will decrease Outcome: Progressing   Problem: Education: Goal: Ability to demonstrate management of disease process will improve Outcome: Progressing Goal: Ability to verbalize understanding of medication therapies will improve Outcome: Progressing Goal: Individualized Educational Video(s) Outcome: Progressing   Problem: Activity: Goal: Capacity to carry out activities will improve Outcome: Progressing   Problem: Cardiac: Goal:  Ability to achieve and maintain adequate cardiopulmonary perfusion will improve Outcome: Progressing   Problem: Education: Goal: Ability to describe self-care measures that may prevent or decrease complications (Diabetes Survival Skills Education) will improve Outcome: Progressing Goal: Individualized Educational Video(s) Outcome: Progressing   Problem: Coping: Goal: Ability to adjust to condition or change in health will improve Outcome: Progressing   Problem: Fluid Volume: Goal: Ability to maintain a balanced intake and output will improve Outcome: Progressing   Problem: Health Behavior/Discharge Planning: Goal: Ability to identify and utilize available resources and services will improve Outcome: Progressing Goal: Ability to manage health-related needs will improve Outcome: Progressing   Problem: Metabolic: Goal: Ability to maintain appropriate glucose levels will improve Outcome: Progressing   Problem: Nutritional: Goal: Maintenance of adequate nutrition will improve Outcome: Progressing Goal: Progress toward achieving an optimal weight will improve Outcome: Progressing   Problem: Skin Integrity: Goal: Risk for impaired skin integrity will decrease Outcome: Progressing   Problem: Tissue Perfusion: Goal: Adequacy of tissue perfusion will improve Outcome: Progressing

## 2023-12-20 NOTE — Progress Notes (Signed)
 Patient Name: Brittany [name]  Date of Encounter: 12/20/2023 [facility] Cardiologist: None   Interval Summary  .    Had a rough night, worsening shortness of breath. Feels like she is having panic attacks when up the the bathroom without her O2.   Vital Signs .    Vitals:   12/20/23 0800 12/20/23 0826 12/20/23 0829 12/20/23 0911  BP:      Pulse:    100  Resp:      Temp:      TempSrc: Oral     SpO2:  92% 97%   Weight:      Height:        Intake/Output Summary (Last 24 hours) at 12/20/2023 0932 Last data filed at 12/20/2023 0300 Gross per 24 hour  Intake --  Output 800 ml  Net -800 ml      12/20/2023    5:00 AM 12/19/2023    5:00 AM 12/18/2023    7:08 AM  Last 3 Weights  Weight (lbs) 93 lb 14.7 oz 104 lb 0.9 oz 108 lb 3.9 oz  Weight (kg) 42.6 kg 47.2 kg 49.1 kg      Telemetry/ECG    Sinus Tachycardia - Personally Reviewed  Physical Exam .   GEN: No acute distress, remains on [place] @2L  Neck: No JVD Cardiac: Tachycardic, no murmurs, rubs, or gallops.  Respiratory: Diminished with course rhonchi GI: Soft, nontender, non-distended  MS: No edema  Assessment & Plan .     63 y.o. female with a hx of chronic HFrEF status post ICD and Barostim, CAD status post CABG in 2019, PAF, hypertension, COPD, CKD, renal infarct 2022, small cell lung cancer treated with chemo, previous smoker who was seen 12/17/2023 for the evaluation of CHF at the request of Dr. Raenelle.    Acute on Chronic BiV CHF S/p Barostim S/p ICD  -- known LVEF of 20-25%, echo this admission with LVEF of 20% g2DD and moderate to severely reduced RV function -- Initially diuresed with IV Lasix , transition back to torsemide  80 mg twice daily -- Net -5.0 L, weights trending down -- GDMT: Previously on ivabradine , Farxiga , spironolactone , digoxin . Ivabradine  has been stopped per MD. Robbie BiDil  BID -- Spiro held yesterday with elevated Cr, resumed this morning. Torsemide  reduced to 80mg  daily --  repeat BNP, consider repeat CXR vs CT chest with worsening shortness of breath overnight as it is difficult to assess her volume status. 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 RSV -- initially required Bipap, has been weaned to [place] @2L   -- Treated with steroids, bronchodilators   CAD status post CABG -- No aspirin  with need for Eliquis , continue atorvastatin  80 mg daily   CKD -- Creatinine on admission 1.6 peaked at 2.3, was improving but increased up to 1.9, down to 1.6 today -- follow BMET   Paroxysmal atrial fibrillation -- in sinus rhythm, tachycardic this morning -- Dig level 0.8 yesterday, continue on dig 0.125 every other day -- Continue Eliquis  2.5 mg twice daily  Hypokalemia -- K+ 3.2, suppl -- check mag   Per primary History of small cell lung CA OSA Renal infarct   Palliative care following, she is a DNR and does not desire aggressive measures.  Previously deemed not a candidate for advanced therapies.  For questions or updates, please contact [facility] Please consult [email or web address] for contact info under  Signed, Manuelita Rummer, NP

## 2023-12-20 NOTE — Progress Notes (Signed)
 Physical Therapy Treatment Patient Details Name: Brittany [name]  MRN: [id number] DOB: Apr 30, 1961 Today's Date: 12/20/2023   History of Present Illness Brittany [name]  is a 63 y.o. female brought to ED via EMS for management of acute respiratory distress. PMH: COPD, paroxysmal atrial fibrillation, previous chronic smoker, essential hypertension, history of small cell lung cancer treated with chemotherapy and radiation in 2015, CAD status post CABG, chronic systolic heart failure reduced EF 25% status post defibrillator and and hyperlipidemia    PT Comments  Attempted to see pt x 2 - refused on initial attempt and on 2nd attempt pt demanding to go to bathroom, then bathe at sink. Pt irritable and visibly annoyed with therapist's arrival stating I can go to the bathroom and I can bathe myself. Pt performed bed mobility mod I using bed features and transfer without AD and CGA throughout session. Pt ambulated into bathroom with CGA and able to perform toileting tasks without assist. Pt sat at sink and washed upper/lower body with set up assist and increased time. Pt on 2L O2 with SPO2 >92%, however pt extremely SOB and required frequent rest breaks. Continue to recommend HHPT upon discharge. Acute PT to cont to follow.    If plan is discharge home, recommend the following: A little help with walking and/or transfers;A little help with bathing/dressing/bathroom;Assist for transportation;Help with stairs or ramp for entrance   Can travel by private vehicle        Equipment Recommendations  BSC/3in1    Recommendations for Other Services       Precautions / Restrictions Precautions Precautions: Fall Precaution Comments: SOB, monitor O2 Restrictions Weight Bearing Restrictions Per Provider Order: No     Mobility  Bed Mobility Overal bed mobility: Modified Independent             General bed mobility comments: increased time and use of bed features    Transfers Overall  transfer level: Needs assistance Equipment used: 1 person hand held assist Transfers: Sit to/from Stand Sit to Stand: Contact guard assist           General transfer comment: Pt performed multiple stands with CGA and increased time throughout session.    Ambulation/Gait Ambulation/Gait assistance: Contact guard assist Gait Distance (Feet): 20 Feet (16ft+5ft+5ft) Assistive device: None Gait Pattern/deviations: Decreased stride length, Trunk flexed, Step-to pattern, Decreased step length - right, Decreased step length - left, Narrow base of support Gait velocity: decreased Gait velocity interpretation: <1.31 ft/sec, indicative of household ambulator   General Gait Details: pt SOB with ambulation. Pt on 2L but SPO2 >92%   Stairs             Wheelchair Mobility     Tilt Bed    Modified Rankin (Stroke Patients Only)       Balance Overall balance assessment: Needs assistance Sitting-balance support: Feet supported, No upper extremity supported Sitting balance-Leahy Scale: Fair Sitting balance - Comments: able to maintain static sitting balance with supervision     Standing balance-Leahy Scale: Fair Standing balance comment: able to maintain static standing balance with UE support and supervision but requied CGA for dynamic standing balance                            Cognition Arousal: Alert Behavior During Therapy: Agitated (irritable) Overall Cognitive Status: No family/caregiver present to determine baseline cognitive functioning  General Comments: pt refused inital attempt, rolling eyes at therapist. On second attempt, pt still irritated but agreeable to OOB mobility stating I wanna go to the bathroom and wash up but I can take care of myself        Exercises      General Comments General comments (skin integrity, edema, etc.): Pt ambulated to bathroom to toilet, then sat at sink to bathe       Pertinent Vitals/Pain Pain Assessment Pain Assessment: No/denies pain    Home Living                          Prior Function            PT Goals (current goals can now be found in the care plan section) Acute Rehab PT Goals Patient Stated Goal: home PT Goal Formulation: With patient Time For Goal Achievement: 01/01/24 Potential to Achieve Goals: Good Progress towards PT goals: Progressing toward goals    Frequency    Min 1X/week      PT Plan      Co-evaluation              AM-PAC PT 6 Clicks Mobility   Outcome Measure  Help needed turning from your back to your side while in a flat bed without using bedrails?: None Help needed moving from lying on your back to sitting on the side of a flat bed without using bedrails?: None Help needed moving to and from a bed to a chair (including a wheelchair)?: A Little Help needed standing up from a chair using your arms (e.g., wheelchair or bedside chair)?: A Little Help needed to walk in hospital room?: A Little Help needed climbing 3-5 steps with a railing? : A Lot 6 Click Score: 19    End of Session Equipment Utilized During Treatment: Oxygen Activity Tolerance: Patient limited by fatigue Patient left: in bed;with call bell/phone within reach;with bed alarm set Nurse Communication: Mobility status PT Visit Diagnosis: Unsteadiness on feet (R26.81);Muscle weakness (generalized) (M62.81);Difficulty in walking, not elsewhere classified (R26.2)     Time: 8860-8794 PT Time Calculation (min) (ACUTE ONLY): 26 min  Charges:    $Therapeutic Activity: 23-37 mins PT General Charges $$ ACUTE PT VISIT: 1 Visit                     Therisa Stains PT, DPT Therisa HERO Zaunegger 12/20/2023, 12:32 PM

## 2023-12-20 NOTE — Plan of Care (Signed)
  Problem: Health Behavior/Discharge Planning: Goal: Ability to manage health-related needs will improve Outcome: Progressing   Problem: Clinical Measurements: Goal: Ability to maintain clinical measurements within normal limits will improve Outcome: Progressing   Problem: Coping: Goal: Level of anxiety will decrease Outcome: Progressing   Problem: Metabolic: Goal: Ability to maintain appropriate glucose levels will improve Outcome: Progressing   Problem: Nutritional: Goal: Maintenance of adequate nutrition will improve Outcome: Progressing

## 2023-12-20 NOTE — Progress Notes (Signed)
 PROGRESS NOTE        PATIENT DETAILS Name: Brittany [name]  Age: 64 y.o. Sex: female Date of Birth: 1961-10-22 Admit Date: 12/14/2023 Admitting Physician Micaela Speaker, MD ERE:Tpodnw, Raguel, MD  Brief Summary: Patient is a 63 y.o.  female with history of COPD on home O2, HFrEF, AICD implantation, PAF, CKD stage IIIb-presented with worsening shortness of breath-found to have acute on chronic hypoxic/hypercarbic respiratory failure in the setting of COPD/HFrEF exacerbation-due to RSV/coronavirus infection.  Significant events: 1/3>> admit to [facility]-acute on chronic hypoxic respiratory failure-CHF/COPD exacerbation-+ RSV/coronavirus +ve on RVP 1/5>> worsening AKI-diuretics held 1/6>> worsening shortness of breath-on BiPAP most of the day.  Cardiology consulted.  Significant studies: 1/3>> CXR: No PNA 1/4>> Limited echo: EF <20%, RV systolic function moderate to severely reduced.  Significant microbiology data: 1/3>> COVID/influenza/RSV PCR: Negative 1/4>> respiratory virus panel:+ve for coronavirus OC43, RSV 1/4>> blood cultures: No growth  Procedures: None  Consults: Cards  Subjective: No major issues overnight-stable on 3 L of oxygen-continues to cough but shortness of breath is significantly better.  Inquiring about discharge-I told her that if she continues to improve-we can contemplate getting her home tomorrow.  Objective: Vitals: Blood pressure (!) 111/57, pulse 100, temperature 98.6 F (37 C), temperature source Oral, resp. rate 16, height 4' 11 (1.499 m), weight 42.6 kg, SpO2 97%.   Exam: Gen Exam:Alert awake-not in any distress HEENT:atraumatic, normocephalic Chest: Moving air well-very few bibasilar rales-some scattered rhonchi. CVS:S1S2 regular Abdomen:soft non tender, non distended Extremities:no edema Neurology: Non focal Skin: no rash  Pertinent Labs/Radiology:    Latest Ref Rng & Units 12/16/2023    5:43 AM 12/15/2023     2:08 AM 12/14/2023   10:58 PM  CBC  WBC 4.0 - 10.5 K/uL 14.3  15.1    Hemoglobin 12.0 - 15.0 g/dL 86.2  85.8  83.6   Hematocrit 36.0 - 46.0 % 41.2  43.9  48.0   Platelets 150 - 400 K/uL 188  161      Lab Results  Component Value Date   NA 135 12/20/2023   K 3.2 (L) 12/20/2023   CL 88 (L) 12/20/2023   CO2 35 (H) 12/20/2023     Assessment/Plan: Acute on chronic hypoxic/hypercarbic respiratory failure (on home O2-2-3 L) Secondary to COPD/CHF exacerbation Continues to improve-stable on just 3 L of oxygen-has not required BiPAP for at least 2-3 days. Continue diuretics/bronchodilators.  COPD exacerbation secondary to coronavirus/RSV infection Much improved Completed a course of steroids Continue scheduled bronchodilators  Acute on chronic HFrEF AICD in place Hypoxia/volume status much better over the past several days Cardiology following and optimizing GDMT medications  AKI on CKD stage IIIb AKI hemodynamically mediated-possibly related to low flow state/cardiorenal syndrome Renal failure has stabilized-creatinine close to baseline Renal function relatively stable Cardiology titrating down midodrine .  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 Understands tenuous overall situation-frail hemodynamics.  Nutrition Status: Nutrition Problem: Severe Malnutrition Etiology: chronic illness  Signs/Symptoms: meal completion < 25%, severe fat depletion, severe muscle depletion Interventions: Ensure Enlive  (each supplement provides 350kcal and 20 grams of protein), Liberalize Diet, MVI    Underweight: Estimated body mass index is 18.97 kg/m as calculated from the following:   Height as of this encounter: 4' 11 (1.499 m).   Weight as of this encounter: 42.6 kg.   Code status:   Code Status: Limited: Do not attempt resuscitation (DNR) -DNR-LIMITED -Do Not Intubate/DNI    DVT Prophylaxis: apixaban  (ELIQUIS ) tablet 2.5 mg Start: 12/15/23 0100 SCDs Start: 12/15/23 0059 Place TED hose Start: 12/15/23 0059 apixaban  (ELIQUIS ) tablet 2.5 mg    Family Communication: None at bedside   Disposition Plan: Status is: Inpatient Remains inpatient appropriate because: Severity of illness   Planned Discharge Destination:Home health likely on 1/10   Diet: Diet Order             Diet heart healthy/carb modified Fluid consistency: Thin; Fluid restriction: 1200 mL Fluid  Diet effective now                     Antimicrobial agents: Anti-infectives (From admission, onward)    Start     Dose/Rate Route Frequency Ordered Stop   12/16/23 0000  cefTRIAXone  (ROCEPHIN ) 2 g in sodium chloride  0.9 % 100 mL IVPB  Status:  Discontinued        2 g 200 mL/hr over 30 Minutes Intravenous Every 24 hours 12/15/23 0058 12/15/23 2117   12/15/23 0200  doxycycline  (VIBRAMYCIN ) 100 mg in dextrose  5 % 250 mL IVPB  Status:  Discontinued        100 mg 125 mL/hr over 120 Minutes Intravenous Every 12 hours 12/15/23 0105 12/15/23 1038   12/15/23 0145  cefTRIAXone  (ROCEPHIN ) 2 g in sodium chloride  0.9 % 100 mL IVPB        2 g 200 mL/hr over 30 Minutes Intravenous  Once 12/15/23 0135 12/15/23 0259   12/15/23 0100  azithromycin  (ZITHROMAX ) 500 mg in sodium chloride  0.9 % 250 mL IVPB  Status:  Discontinued        500 mg 250 mL/hr over 60 Minutes Intravenous Every 24 hours 12/15/23 0058 12/15/23 0104        MEDICATIONS: Scheduled Meds:  apixaban   2.5 mg Oral BID   atorvastatin   80 mg Oral Daily   budesonide   (PULMICORT ) nebulizer solution  0.25 mg Nebulization BID   dapagliflozin  propanediol  10 mg Oral Daily   digoxin   125 mcg Oral QODAY   feeding supplement  237 mL Oral TID BM   guaiFENesin -dextromethorphan   5 mL Oral Q6H   insulin  aspart  0-20 Units Subcutaneous TID WC   insulin  aspart  0-5 Units Subcutaneous QHS   ipratropium-albuterol   3 mL Nebulization TID   isosorbide -hydrALAZINE   1 tablet Oral BID   ivabradine   7.5 mg Oral BID WC   midodrine   5 mg Oral TID WC   multivitamin with minerals  1 tablet Oral Daily   pantoprazole   40 mg Oral Daily   [START ON 12/21/2023] potassium chloride   40 mEq Oral Daily   [START ON 12/21/2023] spironolactone   25 mg Oral Daily   torsemide   80 mg Oral Daily   Continuous Infusions: PRN Meds:.acetaminophen  **OR** acetaminophen , ALPRAZolam , cyclobenzaprine , levalbuterol , senna-docusate   I have personally reviewed following labs and imaging studies  LABORATORY DATA: CBC: Recent Labs  Lab 12/14/23 2130 12/14/23 2258 12/15/23 0208 12/16/23 0543  WBC 13.3*  --  15.1* 14.3*  NEUTROABS 10.4*  --   --   --   HGB 15.4* 16.3* 14.1 13.7  HCT 49.5* 48.0* 43.9 41.2  MCV 94.5  --  91.6 88.2  PLT 247  --  161 188    Basic Metabolic Panel: Recent Labs  Lab 12/16/23 0543 12/17/23 0432 12/18/23 0430 12/19/23 0509 12/20/23 0428  NA 134* 133* 135 133* 135  K 3.7 4.2 4.1 4.7 3.2*  CL 93* 95* 89* 83* 88*  CO2 24 25 29 31  35*  GLUCOSE 209* 257* 311* 428* 133*  BUN 51* 59* 63* 79* 70*  CREATININE 2.30* 1.74* 1.86* 1.99* 1.60*  CALCIUM  10.1 10.5* 10.8* 10.6* 10.3  MG  --   --   --   --  2.2    GFR: Estimated Creatinine Clearance: 24.2 mL/min (A) (by C-G formula based on SCr of 1.6 mg/dL (H)).  Liver Function Tests: Recent Labs  Lab 12/14/23 2130 12/15/23 0208  AST 33 30  ALT 17 18  ALKPHOS 102 93  BILITOT 1.3* 1.0  PROT 8.2* 7.7  ALBUMIN  4.3 3.9   No results for input(s): LIPASE, AMYLASE in the last 168 hours. No results for  input(s): AMMONIA in the last 168 hours.  Coagulation Profile: No results for input(s): INR, PROTIME in the last 168 hours.  Cardiac Enzymes: No results for input(s): CKTOTAL, CKMB, CKMBINDEX, TROPONINI in the last 168 hours.  BNP (last 3 results) No results for input(s): PROBNP in the last 8760 hours.  Lipid Profile: No results for input(s): CHOL, HDL, LDLCALC, TRIG, CHOLHDL, LDLDIRECT in the last 72 hours.  Thyroid  Function Tests: No results for input(s): TSH, T4TOTAL, FREET4, T3FREE, THYROIDAB in the last 72 hours.  Anemia Panel: No results for input(s): VITAMINB12, FOLATE, FERRITIN, TIBC, IRON, RETICCTPCT in the last 72 hours.  Urine analysis:    Component Value Date/Time   COLORURINE YELLOW 05/23/2023 1519   APPEARANCEUR CLEAR 05/23/2023 1519   LABSPEC 1.013 05/23/2023 1519   PHURINE 6.0 05/23/2023 1519   GLUCOSEU NEGATIVE 05/23/2023 1519   HGBUR NEGATIVE 05/23/2023 1519   BILIRUBINUR NEGATIVE 05/23/2023 1519   KETONESUR NEGATIVE 05/23/2023 1519   PROTEINUR NEGATIVE 05/23/2023 1519   UROBILINOGEN 1.0 03/15/2015 1117   NITRITE NEGATIVE 05/23/2023 1519   LEUKOCYTESUR NEGATIVE 05/23/2023 1519    Sepsis Labs: Lactic Acid, Venous    Component Value Date/Time   LATICACIDVEN 1.3 08/04/2023 1111    MICROBIOLOGY: Recent Results (from the past 240 hours)  Resp panel by RT-PCR (RSV, Flu A&B, Covid) Anterior Nasal Swab     Status: None   Collection Time: 12/14/23  9:31 PM   Specimen: Anterior Nasal Swab  Result Value Ref Range Status   SARS Coronavirus 2 by RT PCR NEGATIVE NEGATIVE Final   Influenza A by PCR NEGATIVE NEGATIVE Final   Influenza B by PCR NEGATIVE NEGATIVE Final    Comment: (NOTE) The Xpert Xpress SARS-CoV-2/FLU/RSV plus assay is intended as an aid in the diagnosis of influenza from Nasopharyngeal swab specimens and should not be used as a sole basis for treatment. Nasal washings and aspirates are  unacceptable for Xpert Xpress SARS-CoV-2/FLU/RSV testing.  Fact Sheet for Patients: bloggercourse.com  Fact Sheet for Healthcare Providers: seriousbroker.it  This test is not yet approved or cleared by the [place]  FDA and has been authorized for detection and/or diagnosis of SARS-CoV-2 by FDA under an Emergency Use Authorization (EUA). This EUA will remain in effect (meaning this test can be used) for the duration of the COVID-19 declaration  under Section 564(b)(1) of the Act, 21 U.S.C. section 360bbb-3(b)(1), unless the authorization is terminated or revoked.     Resp Syncytial Virus by PCR NEGATIVE NEGATIVE Final    Comment: (NOTE) Fact Sheet for Patients: bloggercourse.com  Fact Sheet for Healthcare Providers: seriousbroker.it  This test is not yet approved or cleared by the [place]  FDA and has been authorized for detection and/or diagnosis of SARS-CoV-2 by FDA under an Emergency Use Authorization (EUA). This EUA will remain in effect (meaning this test can be used) for the duration of the COVID-19 declaration under Section 564(b)(1) of the Act, 21 U.S.C. section 360bbb-3(b)(1), unless the authorization is terminated or revoked.  Performed at [facility], [address] [place], [place] [address]   Respiratory (~20 pathogens) panel by PCR     Status: Abnormal   Collection Time: 12/15/23  1:00 AM   Specimen: Nasopharyngeal Swab; Respiratory  Result Value Ref Range Status   Adenovirus NOT DETECTED NOT DETECTED Final   Coronavirus 229E NOT DETECTED NOT DETECTED Final    Comment: (NOTE) The Coronavirus on the Respiratory Panel, DOES NOT test for the novel  Coronavirus (2019 nCoV)    Coronavirus HKU1 NOT DETECTED NOT DETECTED Final   Coronavirus NL63 NOT DETECTED NOT DETECTED Final   Coronavirus OC43 DETECTED (A) NOT DETECTED Final   Metapneumovirus NOT  DETECTED NOT DETECTED Final   Rhinovirus / Enterovirus NOT DETECTED NOT DETECTED Final   Influenza A NOT DETECTED NOT DETECTED Final   Influenza B NOT DETECTED NOT DETECTED Final   Parainfluenza Virus 1 NOT DETECTED NOT DETECTED Final   Parainfluenza Virus 2 NOT DETECTED NOT DETECTED Final   Parainfluenza Virus 3 NOT DETECTED NOT DETECTED Final   Parainfluenza Virus 4 NOT DETECTED NOT DETECTED Final   Respiratory Syncytial Virus DETECTED (A) NOT DETECTED Final   Bordetella pertussis NOT DETECTED NOT DETECTED Final   Bordetella Parapertussis NOT DETECTED NOT DETECTED Final   Chlamydophila pneumoniae NOT DETECTED NOT DETECTED Final   Mycoplasma pneumoniae NOT DETECTED NOT DETECTED Final    Comment: Performed at [facility], [address] [place], [place] [address]  Culture, blood (Routine X 2) w Reflex to ID Panel     Status: None   Collection Time: 12/15/23  1:05 AM   Specimen: BLOOD LEFT ARM  Result Value Ref Range Status   Specimen Description BLOOD LEFT ARM  Final   Special Requests   Final    BOTTLES DRAWN AEROBIC ONLY Blood Culture results may not be optimal due to an inadequate volume of blood received in culture bottles   Culture   Final    NO GROWTH 5 DAYS Performed at [facility], [address], [place], [place] [address]    Report Status 12/20/2023 FINAL  Final  Culture, blood (Routine X 2) w Reflex to ID Panel     Status: None   Collection Time: 12/15/23  2:07 AM   Specimen: BLOOD RIGHT ARM  Result Value Ref Range Status   Specimen Description BLOOD RIGHT ARM  Final   Special Requests   Final    BOTTLES DRAWN AEROBIC AND ANAEROBIC Blood Culture adequate volume   Culture   Final    NO GROWTH 5 DAYS Performed at [facility], [address] [place], [place] [address]    Report Status 12/20/2023 FINAL  Final    RADIOLOGY STUDIES/RESULTS: No results found.    LOS: 5 days   Donalda Applebaum, MD  [facility]    To contact the  attending provider between 7A-7P or the covering provider during after hours 7P-7A, please log into the web site [email or web address] and access using universal [place] password for that web site. If you do not have the password, please call the hospital operator.  12/20/2023, 1:57 PM

## 2023-12-21 ENCOUNTER — Inpatient Hospital Stay (HOSPITAL_COMMUNITY)
Admission: RE | Admit: 2023-12-21 | Discharge: 2023-12-21 | Disposition: A | Discharge status: Home / Self Care | Payer: Medicare Other | Source: Ambulatory Visit | Attending: Internal Medicine | Admitting: Internal Medicine

## 2023-12-21 DIAGNOSIS — R0603 Acute respiratory distress: Secondary | ICD-10-CM | POA: Diagnosis not present

## 2023-12-21 DIAGNOSIS — N179 Acute kidney failure, unspecified: Secondary | ICD-10-CM | POA: Diagnosis not present

## 2023-12-21 DIAGNOSIS — I5041 Acute combined systolic (congestive) and diastolic (congestive) heart failure: Secondary | ICD-10-CM | POA: Diagnosis not present

## 2023-12-21 DIAGNOSIS — J9601 Acute respiratory failure with hypoxia: Secondary | ICD-10-CM | POA: Diagnosis not present

## 2023-12-21 DIAGNOSIS — J441 Chronic obstructive pulmonary disease with (acute) exacerbation: Secondary | ICD-10-CM | POA: Diagnosis not present

## 2023-12-21 LAB — BASIC METABOLIC PANEL
Anion gap: 14 (ref 5–15)
BUN: 61 mg/dL — ABNORMAL HIGH (ref 8–23)
CO2: 33 mmol/L — ABNORMAL HIGH (ref 22–32)
Calcium: 10.5 mg/dL — ABNORMAL HIGH (ref 8.9–10.3)
Chloride: 91 mmol/L — ABNORMAL LOW (ref 98–111)
Creatinine, Ser: 1.53 mg/dL — ABNORMAL HIGH (ref 0.44–1.00)
GFR, Estimated: 38 mL/min — ABNORMAL LOW (ref >60)
Glucose, Bld: 212 mg/dL — ABNORMAL HIGH (ref 70–99)
Potassium: 3.6 mmol/L (ref 3.5–5.1)
Sodium: 138 mmol/L (ref 135–145)

## 2023-12-21 LAB — GLUCOSE, CAPILLARY
Glucose-Capillary: 198 mg/dL — ABNORMAL HIGH (ref 70–99)
Glucose-Capillary: 314 mg/dL — ABNORMAL HIGH (ref 70–99)
Glucose-Capillary: 134 mg/dL — ABNORMAL HIGH (ref 70–99)
Glucose-Capillary: 113 mg/dL — ABNORMAL HIGH (ref 70–99)
Glucose-Capillary: 55 mg/dL — ABNORMAL LOW (ref 70–99)

## 2023-12-21 NOTE — Progress Notes (Signed)
 This chaplain responded to PMT NP-Michele consult for spiritual care. The chaplain understands the Pt. is requesting support and prayers.   The Pt. is awake at the time of the visit and politely declined the visit, due to lack of energy. The chaplain assured the Pt. of her thoughts and intercessory prayer until the time we can visit.  Chaplain Leeroy Hummer [phone]

## 2023-12-21 NOTE — Progress Notes (Signed)
   Patient Name: Brittany [name]  Date of Encounter: 12/21/2023 [facility] Cardiologist: None   Interval Summary  .    Patient resting in bed this morning, in no acute distress. She reports fluctuant low energy/dyspnea. Denies focal complaints.   Vital Signs .    Vitals:   12/21/23 0008 12/21/23 0358 12/21/23 0542 12/21/23 0600  BP: (!) 91/58 92/62 91/65    Pulse: 100 (!) 102 (!) 102 99  Resp: 20 (!) 21 (!) 26 (!) 23  Temp: 98.2 F (36.8 C) 98 F (36.7 C) 97.6 F (36.4 C)   TempSrc: Oral Oral Axillary   SpO2: 94% 95% 97% 96%  Weight:      Height:        Intake/Output Summary (Last 24 hours) at 12/21/2023 1024 Last data filed at 12/20/2023 2024 Gross per 24 hour  Intake --  Output 900 ml  Net -900 ml      12/20/2023    5:00 AM 12/19/2023    5:00 AM 12/18/2023    7:08 AM  Last 3 Weights  Weight (lbs) 93 lb 14.7 oz 104 lb 0.9 oz 108 lb 3.9 oz  Weight (kg) 42.6 kg 47.2 kg 49.1 kg      Telemetry/ECG    Persistent sinus tachycardia ~ 100bpm - Personally Reviewed  Physical Exam .   GEN: No acute distress. Remains on [place]. Neck: mild-moderate elevation of JVP on exam. Cardiac: regular and rapid rate, no murmurs, rubs, or gallops.  Respiratory: Bilateral rhonchi, lower lobes>upper lobes GI: Soft, nontender, non-distended  MS: No edema  Assessment & Plan .     Acute on chronic BiV CHF S/P Barostim S/P ICD  Longstanding cardiomyopathy with LVEF ~20-25%. TTE this admission with LVEF 20%, moderate to severely reduced RV function with grade II DD. Patient now back on torsemide , 80mg  daily. Net negative 6.8L this admission.  Patient volume status is difficult to discern. Does not appear overtly volume overloaded on exam, though does have minor elevation of JVP. BNP 3015.7->902.0 is reassuring.  Continue Farxiga , Spironolactone  25mg  (stable creatinine), Digoxin  125mcg daily. Hold Bidil  with hypotension this morning. Continue Midodrine  5mg  TID for BP support. GDMT  remains very limited by renal dysfunction and low BP, will hold Bidil  which had been added this admission.  Patient now back on home Ivabradine , continue.  No additional changes to cardiac regimen recommended at this time.   Acute on chronic hypoxic respiratory failure with RSV Core pulmonale with RV failure, COPD  Respiratory status seems to be slowly improving. As noted above, volume status by labs improved.   Continue steroids/bronchodilators per primary team.   CAD s/p CABG  No anginal symptoms but has had chest tightness this admission. No ECG evidence of acute ischemia.   Continue Eliquis  and Atorvastatin .  Paroxysmal atrial fibrillation  Patient with persistent sinus tachycardia this admission. Evening of 1/9 with acute tachycardia into the 160s.   Continue Digoxin  125mcg daily Continue Ivabradine  as above Continue Eliquis .   CKD IIIb  Creatinine up to 2.3 this admission, now down trending 1.60->1.53. Continue to monitor with increased Spironolactone  dosing.    For questions or updates, please contact [facility] Please consult [email or web address] for contact info under        Signed, Artist Pouch, PA-C

## 2023-12-21 NOTE — Progress Notes (Signed)
 Nutrition Follow-up  DOCUMENTATION CODES:   Severe malnutrition in context of chronic illness  INTERVENTION:  Continue with current diet Continue with Ensure Plus High Protein po BID, each supplement provides 350 kcal and 20 grams of protein. Continue with MVM   NUTRITION DIAGNOSIS:   Severe Malnutrition related to chronic illness as evidenced by meal completion < 25%, severe fat depletion, severe muscle depletion.    GOAL:   Patient will meet greater than or equal to 90% of their needs    MONITOR:   PO intake, Supplement acceptance  REASON FOR ASSESSMENT:   Consult Assessment of nutrition requirement/status  ASSESSMENT:   63 y.o. F, Presented presented from motel to ED via [facility], with complaints of, respiratory distress, lethargic.  Admitting with acute respiratory distress secondary to CHF and COPD. 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  NO changes in current intake per patient.  She stated she is at her usual eating baseline.  Per MD hopeful discharge today. Diet liberalized 1/6, 1/8 restricted again.  Suspect current nutr poc providing adequate nutrition.  Will continue with current nutr poc.    Significant events: 1/3>> admit to [facility]-acute on chronic hypoxic respiratory failure-CHF/COPD exacerbation-+ RSV/coronavirus +ve on RVP 1/5>> worsening AKI-diuretics held 1/6>> worsening shortness of breath-on BiPAP most of the day.  Cardiology consulted.   Significant studies: 1/3>> CXR: No PNA 1/4>> Limited echo: EF <20%, RV systolic function moderate to severely reduced.   Significant microbiology data: 1/3>> COVID/influenza/RSV PCR: Negative 1/4>> respiratory virus panel:+ve for coronavirus OC43, RSV 1/4>> blood cultures: No  growth   Admit weight: 43.6 kg Weight history; 12/20/23 42.6 kg  11/21/23 43.6 kg  10/30/23 43.1 kg  10/11/23 43.1 kg  10/09/23 45.2 kg  09/29/23 41.5 kg  08/16/23 43.4 kg  08/06/23 41.4 kg  05/30/23 43.5 kg  05/28/23 44.9 kg   Hospital weight history:  Date/Time Weight Weight in lbs  12/20/23 0500 42.6 kg 93.92 lbs  12/19/23 0500 47.2 kg 104.06 lbs  12/18/23 0708 49.1 kg 108.25 lbs  12/16/23 0419 44.9 kg 98.99 lbs  12/14/23 21:51:19 43.6 kg 96.12 lbs    Average Meal Intake: No current documentation.   Nutritionally Relevant Medications: Scheduled Meds:  apixaban   2.5 mg Oral BID   feeding supplement  237 mL Oral TID BM   multivitamin with minerals  1 tablet Oral Daily   potassium chloride   40 mEq Oral Daily   spironolactone   25 mg Oral Daily   torsemide   80 mg Oral Daily    Labs Reviewed    NUTRITION - FOCUSED PHYSICAL EXAM:  Flowsheet Row Most Recent Value  Orbital Region Severe depletion  Upper Arm Region Severe depletion  Thoracic and Lumbar Region Severe depletion  Buccal Region Severe depletion  Temple Region Severe depletion  Clavicle Bone Region Severe depletion  Clavicle and Acromion Bone Region Severe depletion  Scapular Bone Region Severe depletion  Dorsal Hand Severe depletion  Patellar Region Severe depletion  Anterior Thigh Region Severe depletion  Posterior Calf Region Severe depletion  Edema (RD Assessment) None  Hair Reviewed  Eyes Reviewed  Mouth Reviewed  Skin Reviewed  Nails Reviewed       Diet Order:   Diet Order             Diet heart healthy/carb modified  Fluid consistency: Thin; Fluid restriction: 1200 mL Fluid  Diet effective now                   EDUCATION NEEDS:   No education needs have been identified at this time  Skin:  Skin Assessment: Reviewed RN Assessment  Last BM:  PTA  Height:   Ht Readings from Last 1 Encounters:  12/14/23 4' 11 (1.499 m)    Weight:   Wt Readings from Last 1  Encounters:  12/20/23 42.6 kg    Ideal Body Weight:     BMI:  Body mass index is 18.97 kg/m.  Estimated Nutritional Needs:   Kcal:  1400-1600 kcal  Protein:  60-70 g  Fluid:  >/= 1.5L    Jenna Pew RDN, LDN Clinical Dietitian   If unable to reach, please contact RD Inpatient secure chat group between 8 am-4 pm daily

## 2023-12-21 NOTE — Progress Notes (Signed)
 PROGRESS NOTE        PATIENT DETAILS Name: Brittany [name]  Age: 63 y.o. Sex: female Date of Birth: 1961-08-04 Admit Date: 12/14/2023 Admitting Physician Micaela Speaker, MD ERE:Tpodnw, Raguel, MD  Brief Summary: Patient is a 63 y.o.  female with history of COPD on home O2, HFrEF, AICD implantation, PAF, CKD stage IIIb-presented with worsening shortness of breath-found to have acute on chronic hypoxic/hypercarbic respiratory failure in the setting of COPD/HFrEF exacerbation-due to RSV/coronavirus infection.  Significant events: 1/3>> admit to [facility] on chronic hypoxic respiratory failure-CHF/COPD exacerbation-+ RSV/coronavirus +ve on RVP 1/5>> worsening AKI-diuretics held 1/6>> worsening shortness of breath-on BiPAP most of the day.  Cardiology consulted.  Significant studies: 1/3>> CXR: No PNA 1/4>> Limited echo: EF <20%, RV systolic function moderate to severely reduced.  Significant microbiology data: 1/3>> COVID/influenza/RSV PCR: Negative 1/4>> respiratory virus panel:+ve for coronavirus OC43, RSV 1/4>> blood cultures: No growth  Procedures: None  Consults: Cards  Subjective: Feels that she is too weak-her breathing is okay-claims that she finally slept well last night.  She is very anxious about going home today-she is afraid that she will lose electricity/power and that she will not be able to use a BiPAP and other devices.  Objective: Vitals: Blood pressure 91/65, pulse 99, temperature 97.6 F (36.4 C), temperature source Axillary, resp. rate (!) 23, height 4' 11 (1.499 m), weight 42.6 kg, SpO2 96%.   Exam: Gen Exam:Alert awake-not in any distress HEENT:atraumatic, normocephalic Chest: B/L clear to auscultation anteriorly CVS:S1S2 regular Abdomen:soft non tender, non distended Extremities:no edema Neurology: Non focal Skin: no rash  Pertinent Labs/Radiology:    Latest Ref Rng & Units 12/16/2023    5:43 AM 12/15/2023    2:08  AM 12/14/2023   10:58 PM  CBC  WBC 4.0 - 10.5 K/uL 14.3  15.1    Hemoglobin 12.0 - 15.0 g/dL 86.2  85.8  83.6   Hematocrit 36.0 - 46.0 % 41.2  43.9  48.0   Platelets 150 - 400 K/uL 188  161      Lab Results  Component Value Date   NA 138 12/21/2023   K 3.6 12/21/2023   CL 91 (L) 12/21/2023   CO2 33 (H) 12/21/2023     Assessment/Plan: Acute on chronic hypoxic/hypercarbic respiratory failure (on home O2-2-3 L) Secondary to COPD/CHF exacerbation Much improved with treatment of underlying etiologies-back on her usual home regimen of 2-3 L. Continue bronchodilators/diuretics.  COPD exacerbation secondary to coronavirus/RSV infection Much improved Completed a course of steroids Continue scheduled bronchodilators  Acute on chronic HFrEF AICD in place Hypoxia/volume status has improved-she appears euvolemic Cardiology following and optimizing GDMT medications  AKI on CKD stage IIIb AKI hemodynamically mediated-possibly related to low flow state/cardiorenal syndrome Renal failure has stabilized-creatinine close to baseline Renal function relatively stable  PAF Telemetry monitoring Digoxin  level stable  Eliquis   CAD s/p CABG 2019-and PCI to ostial ramus extending to left main 2020 No anginal symptoms  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 Understands tenuous overall situation-frail hemodynamics.  Nutrition Status: Nutrition Problem: Severe Malnutrition Etiology: chronic illness Signs/Symptoms: meal completion <  25%, severe fat depletion, severe muscle depletion Interventions: Ensure Enlive (each supplement provides 350kcal and 20 grams of  protein), Liberalize Diet, MVI    Underweight: Estimated body mass index is 18.97 kg/m as calculated from the following:   Height as of this encounter: 4' 11 (1.499 m).   Weight as of this encounter: 42.6 kg.   Code status:   Code Status: Limited: Do not attempt resuscitation (DNR) -DNR-LIMITED -Do Not Intubate/DNI    DVT Prophylaxis: apixaban  (ELIQUIS ) tablet 2.5 mg Start: 12/15/23 0100 SCDs Start: 12/15/23 0059 Place TED hose Start: 12/15/23 0059 apixaban  (ELIQUIS ) tablet 2.5 mg    Family Communication: None at bedside   Disposition Plan: Status is: Inpatient Remains inpatient appropriate because: Severity of illness   Planned Discharge Destination:Home health likely on 1/11   Diet: Diet Order             Diet heart healthy/carb modified Fluid consistency: Thin; Fluid restriction: 1200 mL Fluid  Diet effective now                     Antimicrobial agents: Anti-infectives (From admission, onward)    Start     Dose/Rate Route Frequency Ordered Stop   12/16/23 0000  cefTRIAXone  (ROCEPHIN ) 2 g in sodium chloride  0.9 % 100 mL IVPB  Status:  Discontinued        2 g 200 mL/hr over 30 Minutes Intravenous Every 24 hours 12/15/23 0058 12/15/23 2117   12/15/23 0200  doxycycline  (VIBRAMYCIN ) 100 mg in dextrose  5 % 250 mL IVPB  Status:  Discontinued        100 mg 125 mL/hr over 120 Minutes Intravenous Every 12 hours 12/15/23 0105 12/15/23 1038   12/15/23 0145  cefTRIAXone  (ROCEPHIN ) 2 g in sodium chloride  0.9 % 100 mL IVPB        2 g 200 mL/hr over 30 Minutes Intravenous  Once 12/15/23 0135 12/15/23 0259   12/15/23 0100  azithromycin  (ZITHROMAX ) 500 mg in sodium chloride  0.9 % 250 mL IVPB  Status:  Discontinued        500 mg 250 mL/hr over 60 Minutes Intravenous Every 24 hours 12/15/23 0058 12/15/23 0104        MEDICATIONS: Scheduled Meds:  apixaban   2.5 mg Oral BID   arformoterol   15 mcg Nebulization BID   atorvastatin   80 mg Oral Daily   budesonide   (PULMICORT ) nebulizer solution  0.25 mg Nebulization BID   dapagliflozin  propanediol  10 mg Oral Daily   digoxin   125 mcg Oral QODAY   feeding supplement  237 mL Oral TID BM   guaiFENesin -dextromethorphan   5 mL Oral Q6H   insulin  aspart  0-20 Units Subcutaneous TID WC   insulin  aspart  0-5 Units Subcutaneous QHS   ipratropium-albuterol   3 mL Nebulization TID   ivabradine   7.5 mg Oral BID WC   midodrine   5 mg Oral TID WC   multivitamin with minerals  1 tablet Oral Daily   pantoprazole   40 mg Oral Daily   potassium chloride   40 mEq Oral Daily   revefenacin   175 mcg Nebulization Daily   spironolactone   25 mg Oral Daily   torsemide   80 mg Oral Daily   Continuous Infusions: PRN Meds:.acetaminophen  **OR** acetaminophen , ALPRAZolam , cyclobenzaprine , levalbuterol , senna-docusate   I have personally reviewed following labs and imaging studies  LABORATORY DATA: CBC: Recent Labs  Lab 12/14/23 2130 12/14/23 2258 12/15/23 0208 12/16/23 0543  WBC 13.3*  --  15.1* 14.3*  NEUTROABS 10.4*  --   --   --  HGB 15.4* 16.3* 14.1 13.7  HCT 49.5* 48.0* 43.9 41.2  MCV 94.5  --  91.6 88.2  PLT 247  --  161 188    Basic Metabolic Panel: Recent Labs  Lab 12/17/23 0432 12/18/23 0430 12/19/23 0509 12/20/23 0428 12/21/23 0606  NA 133* 135 133* 135 138  K 4.2 4.1 4.7 3.2* 3.6  CL 95* 89* 83* 88* 91*  CO2 25 29 31  35* 33*  GLUCOSE 257* 311* 428* 133* 212*  BUN 59* 63* 79* 70* 61*  CREATININE 1.74* 1.86* 1.99* 1.60* 1.53*  CALCIUM  10.5* 10.8* 10.6* 10.3 10.5*  MG  --   --   --  2.2  --     GFR: Estimated Creatinine Clearance: 25.3 mL/min (A) (by C-G formula based on SCr of 1.53 mg/dL (H)).  Liver Function Tests: Recent Labs  Lab 12/14/23 2130 12/15/23 0208  AST 33 30  ALT 17 18  ALKPHOS 102 93  BILITOT 1.3* 1.0  PROT 8.2* 7.7  ALBUMIN  4.3 3.9   No results for input(s): LIPASE, AMYLASE in the last 168 hours. No results for input(s): AMMONIA in the last 168  hours.  Coagulation Profile: No results for input(s): INR, PROTIME in the last 168 hours.  Cardiac Enzymes: No results for input(s): CKTOTAL, CKMB, CKMBINDEX, TROPONINI in the last 168 hours.  BNP (last 3 results) No results for input(s): PROBNP in the last 8760 hours.  Lipid Profile: No results for input(s): CHOL, HDL, LDLCALC, TRIG, CHOLHDL, LDLDIRECT in the last 72 hours.  Thyroid  Function Tests: No results for input(s): TSH, T4TOTAL, FREET4, T3FREE, THYROIDAB in the last 72 hours.  Anemia Panel: No results for input(s): VITAMINB12, FOLATE, FERRITIN, TIBC, IRON, RETICCTPCT in the last 72 hours.  Urine analysis:    Component Value Date/Time   COLORURINE YELLOW 05/23/2023 1519   APPEARANCEUR CLEAR 05/23/2023 1519   LABSPEC 1.013 05/23/2023 1519   PHURINE 6.0 05/23/2023 1519   GLUCOSEU NEGATIVE 05/23/2023 1519   HGBUR NEGATIVE 05/23/2023 1519   BILIRUBINUR NEGATIVE 05/23/2023 1519   KETONESUR NEGATIVE 05/23/2023 1519   PROTEINUR NEGATIVE 05/23/2023 1519   UROBILINOGEN 1.0 03/15/2015 1117   NITRITE NEGATIVE 05/23/2023 1519   LEUKOCYTESUR NEGATIVE 05/23/2023 1519    Sepsis Labs: Lactic Acid, Venous    Component Value Date/Time   LATICACIDVEN 1.3 08/04/2023 1111    MICROBIOLOGY: Recent Results (from the past 240 hours)  Resp panel by RT-PCR (RSV, Flu A&B, Covid) Anterior Nasal Swab     Status: None   Collection Time: 12/14/23  9:31 PM   Specimen: Anterior Nasal Swab  Result Value Ref Range Status   SARS Coronavirus 2 by RT PCR NEGATIVE NEGATIVE Final   Influenza A by PCR NEGATIVE NEGATIVE Final   Influenza B by PCR NEGATIVE NEGATIVE Final    Comment: (NOTE) The Xpert Xpress SARS-CoV-2/FLU/RSV plus assay is intended as an aid in the diagnosis of influenza from Nasopharyngeal swab specimens and should not be used as a sole basis for treatment. Nasal washings and aspirates are unacceptable for Xpert Xpress  SARS-CoV-2/FLU/RSV testing.  Fact Sheet for Patients: bloggercourse.com  Fact Sheet for Healthcare Providers: seriousbroker.it  This test is not yet approved or cleared by the [place]  FDA and has been authorized for detection and/or diagnosis of SARS-CoV-2 by FDA under an Emergency Use Authorization (EUA). This EUA will remain in effect (meaning this test can be used) for the duration of the COVID-19 declaration under Section 564(b)(1) of the Act, 21 U.S.C. section 360bbb-3(b)(1), unless the  authorization is terminated or revoked.     Resp Syncytial Virus by PCR NEGATIVE NEGATIVE Final    Comment: (NOTE) Fact Sheet for Patients: bloggercourse.com  Fact Sheet for Healthcare Providers: seriousbroker.it  This test is not yet approved or cleared by the [place]  FDA and has been authorized for detection and/or diagnosis of SARS-CoV-2 by FDA under an Emergency Use Authorization (EUA). This EUA will remain in effect (meaning this test can be used) for the duration of the COVID-19 declaration under Section 564(b)(1) of the Act, 21 U.S.C. section 360bbb-3(b)(1), unless the authorization is terminated or revoked.  Performed at [facility], [address], [place], [place] [address]   Respiratory (~20 pathogens) panel by PCR     Status: Abnormal   Collection Time: 12/15/23  1:00 AM   Specimen: Nasopharyngeal Swab; Respiratory  Result Value Ref Range Status   Adenovirus NOT DETECTED NOT DETECTED Final   Coronavirus 229E NOT DETECTED NOT DETECTED Final    Comment: (NOTE) The Coronavirus on the Respiratory Panel, DOES NOT test for the novel  Coronavirus (2019 nCoV)    Coronavirus HKU1 NOT DETECTED NOT DETECTED Final   Coronavirus NL63 NOT DETECTED NOT DETECTED Final   Coronavirus OC43 DETECTED (A) NOT DETECTED Final   Metapneumovirus NOT DETECTED NOT DETECTED Final    Rhinovirus / Enterovirus NOT DETECTED NOT DETECTED Final   Influenza A NOT DETECTED NOT DETECTED Final   Influenza B NOT DETECTED NOT DETECTED Final   Parainfluenza Virus 1 NOT DETECTED NOT DETECTED Final   Parainfluenza Virus 2 NOT DETECTED NOT DETECTED Final   Parainfluenza Virus 3 NOT DETECTED NOT DETECTED Final   Parainfluenza Virus 4 NOT DETECTED NOT DETECTED Final   Respiratory Syncytial Virus DETECTED (A) NOT DETECTED Final   Bordetella pertussis NOT DETECTED NOT DETECTED Final   Bordetella Parapertussis NOT DETECTED NOT DETECTED Final   Chlamydophila pneumoniae NOT DETECTED NOT DETECTED Final   Mycoplasma pneumoniae NOT DETECTED NOT DETECTED Final    Comment: Performed at [facility], [address] [place], [place] [address]  Culture, blood (Routine X 2) w Reflex to ID Panel     Status: None   Collection Time: 12/15/23  1:05 AM   Specimen: BLOOD LEFT ARM  Result Value Ref Range Status   Specimen Description BLOOD LEFT ARM  Final   Special Requests   Final    BOTTLES DRAWN AEROBIC ONLY Blood Culture results may not be optimal due to an inadequate volume of blood received in culture bottles   Culture   Final    NO GROWTH 5 DAYS Performed at [facility], [address], [place], [place] [address]    Report Status 12/20/2023 FINAL  Final  Culture, blood (Routine X 2) w Reflex to ID Panel     Status: None   Collection Time: 12/15/23  2:07 AM   Specimen: BLOOD RIGHT ARM  Result Value Ref Range Status   Specimen Description BLOOD RIGHT ARM  Final   Special Requests   Final    BOTTLES DRAWN AEROBIC AND ANAEROBIC Blood Culture adequate volume   Culture   Final    NO GROWTH 5 DAYS Performed at [facility], [address], [place], [place] [address]    Report Status 12/20/2023 FINAL  Final    RADIOLOGY STUDIES/RESULTS: No results found.    LOS: 6 days   Donalda Applebaum, MD  [facility]    To contact the attending provider between  7A-7P  or the covering provider during after hours 7P-7A, please log into the web site [email or web address] and access using universal [place] password for that web site. If you do not have the password, please call the hospital operator.  12/21/2023, 12:05 PM

## 2023-12-21 NOTE — Progress Notes (Signed)
 Hypoglycemic Event  CBG: 55  Treatment: 4 oz Orange juice   Symptoms: None  Follow-up CBG: Time:2015 CBG Result:113   Anakin Varkey D Keefe Zawistowski

## 2023-12-21 NOTE — Plan of Care (Signed)
  Problem: Education: Goal: Knowledge of General Education information will improve Description: Including pain rating scale, medication(s)/side effects and non-pharmacologic comfort measures Outcome: Progressing   Problem: Health Behavior/Discharge Planning: Goal: Ability to manage health-related needs will improve Outcome: Progressing   Problem: Clinical Measurements: Goal: Ability to maintain clinical measurements within normal limits will improve Outcome: Progressing Goal: Will remain free from infection Outcome: Progressing Goal: Diagnostic test results will improve Outcome: Progressing Goal: Respiratory complications will improve Outcome: Progressing Goal: Cardiovascular complication will be avoided Outcome: Progressing   Problem: Activity: Goal: Risk for activity intolerance will decrease Outcome: Progressing   Problem: Nutrition: Goal: Adequate nutrition will be maintained Outcome: Progressing   Problem: Coping: Goal: Level of anxiety will decrease Outcome: Progressing   Problem: Elimination: Goal: Will not experience complications related to bowel motility Outcome: Progressing Goal: Will not experience complications related to urinary retention Outcome: Progressing   Problem: Pain Management: Goal: General experience of comfort will improve Outcome: Progressing   Problem: Safety: Goal: Ability to remain free from injury will improve Outcome: Progressing   Problem: Skin Integrity: Goal: Risk for impaired skin integrity will decrease Outcome: Progressing   Problem: Education: Goal: Ability to demonstrate management of disease process will improve Outcome: Progressing Goal: Ability to verbalize understanding of medication therapies will improve Outcome: Progressing Goal: Individualized Educational Video(s) Outcome: Progressing   Problem: Activity: Goal: Capacity to carry out activities will improve Outcome: Progressing   Problem: Cardiac: Goal:  Ability to achieve and maintain adequate cardiopulmonary perfusion will improve Outcome: Progressing   Problem: Education: Goal: Ability to describe self-care measures that may prevent or decrease complications (Diabetes Survival Skills Education) will improve Outcome: Progressing Goal: Individualized Educational Video(s) Outcome: Progressing   Problem: Coping: Goal: Ability to adjust to condition or change in health will improve Outcome: Progressing   Problem: Fluid Volume: Goal: Ability to maintain a balanced intake and output will improve Outcome: Progressing   Problem: Health Behavior/Discharge Planning: Goal: Ability to identify and utilize available resources and services will improve Outcome: Progressing Goal: Ability to manage health-related needs will improve Outcome: Progressing   Problem: Metabolic: Goal: Ability to maintain appropriate glucose levels will improve Outcome: Progressing   Problem: Nutritional: Goal: Maintenance of adequate nutrition will improve Outcome: Progressing Goal: Progress toward achieving an optimal weight will improve Outcome: Progressing   Problem: Skin Integrity: Goal: Risk for impaired skin integrity will decrease Outcome: Progressing   Problem: Tissue Perfusion: Goal: Adequacy of tissue perfusion will improve Outcome: Progressing

## 2023-12-21 NOTE — Progress Notes (Signed)
   12/21/23 2034  BiPAP/CPAP/SIPAP  $ Non-Invasive Ventilator  Non-Invasive Vent Subsequent  BiPAP/CPAP/SIPAP Pt Type Adult  BiPAP/CPAP/SIPAP DREAMSTATIOND  Mask Type Full face mask  Mask Size Small  Respiratory Rate 20 breaths/min  Flow Rate 2 lpm  Patient Home Equipment No  Auto Titrate No  CPAP/SIPAP surface wiped down Yes   Pt sleeping comfortably on bipap

## 2023-12-22 ENCOUNTER — Other Ambulatory Visit (HOSPITAL_COMMUNITY): Payer: Self-pay

## 2023-12-22 DIAGNOSIS — J441 Chronic obstructive pulmonary disease with (acute) exacerbation: Secondary | ICD-10-CM | POA: Diagnosis not present

## 2023-12-22 DIAGNOSIS — J9601 Acute respiratory failure with hypoxia: Secondary | ICD-10-CM | POA: Diagnosis not present

## 2023-12-22 DIAGNOSIS — I5041 Acute combined systolic (congestive) and diastolic (congestive) heart failure: Secondary | ICD-10-CM | POA: Diagnosis not present

## 2023-12-22 LAB — BASIC METABOLIC PANEL
Anion gap: 14 (ref 5–15)
BUN: 61 mg/dL — ABNORMAL HIGH (ref 8–23)
CO2: 32 mmol/L (ref 22–32)
Calcium: 9.8 mg/dL (ref 8.9–10.3)
Chloride: 86 mmol/L — ABNORMAL LOW (ref 98–111)
Creatinine, Ser: 1.67 mg/dL — ABNORMAL HIGH (ref 0.44–1.00)
GFR, Estimated: 34 mL/min — ABNORMAL LOW (ref >60)
Glucose, Bld: 164 mg/dL — ABNORMAL HIGH (ref 70–99)
Potassium: 4.1 mmol/L (ref 3.5–5.1)
Sodium: 132 mmol/L — ABNORMAL LOW (ref 135–145)

## 2023-12-22 LAB — GLUCOSE, CAPILLARY
Glucose-Capillary: 140 mg/dL — ABNORMAL HIGH (ref 70–99)
Glucose-Capillary: 189 mg/dL — ABNORMAL HIGH (ref 70–99)

## 2023-12-22 MED ORDER — TORSEMIDE 40 MG PO TABS: 80.0000 mg | ORAL_TABLET | Freq: Every day | ORAL | Status: DC | PRN

## 2023-12-22 MED ORDER — INSULIN ASPART 100 UNIT/ML IJ SOLN: 0.0000 [IU] | Freq: Three times a day (TID) | INTRAMUSCULAR | Status: DC

## 2023-12-22 MED ORDER — TORSEMIDE 20 MG PO TABS: 80.0000 mg | ORAL_TABLET | Freq: Every day | ORAL | Status: DC

## 2023-12-22 MED ORDER — SPIRONOLACTONE 25 MG PO TABS: 25.0000 mg | ORAL_TABLET | Freq: Every day | ORAL | Status: DC

## 2023-12-22 MED ORDER — POTASSIUM CHLORIDE ER 10 MEQ PO TBCR: 40.0000 meq | EXTENDED_RELEASE_TABLET | Freq: Every day | ORAL | Status: DC

## 2023-12-22 MED ORDER — MIDODRINE HCL 5 MG PO TABS
5.0000 mg | ORAL_TABLET | Freq: Three times a day (TID) | ORAL | 1 refills | Status: DC
  Filled 2023-12-22: qty 90, 30d supply, fill #0

## 2023-12-22 NOTE — Discharge Summary (Signed)
 PATIENT DETAILS Name: Brittany [name]  Age: 63 y.o. Sex: female Date of Birth: [birth year] MRN: [id number]. Admitting Physician: Subrina Sundil, MD ERE:Tpodnw, Raguel, MD  Admit Date: 12/14/2023 Discharge date: 12/22/2023  Recommendations for Outpatient Follow-up:  Follow up with PCP in 1-2 weeks Please obtain CMP/CBC in one week Please follow up on the following pending results:  Admitted From:  Home  Disposition: Home health   Discharge Condition: good  CODE STATUS:   Code Status: Limited: Do not attempt resuscitation (DNR) -DNR-LIMITED -Do Not Intubate/DNI    Diet recommendation:  Diet Order             Diet - low sodium heart healthy           Diet heart healthy/carb modified Fluid consistency: Thin; Fluid restriction: 1200 mL Fluid  Diet effective now                    Brief Summary: Patient is a 63 y.o.  female with history of COPD on home O2, HFrEF, AICD implantation, PAF, CKD stage IIIb-presented with worsening shortness of breath-found to have acute on chronic hypoxic/hypercarbic respiratory failure in the setting of COPD/HFrEF exacerbation-due to RSV/coronavirus infection.   Significant events: 1/3>> admit to [facility] on chronic hypoxic respiratory failure-CHF/COPD exacerbation-+ RSV/coronavirus +ve on RVP 1/5>> worsening AKI-diuretics held 1/6>> worsening shortness of breath-on BiPAP most of the day.  Cardiology consulted.   Significant studies: 1/3>> CXR: No PNA 1/4>> Limited echo: EF <20%, RV systolic function moderate to severely reduced.   Significant microbiology data: 1/3>> COVID/influenza/RSV PCR: Negative 1/4>> respiratory virus panel:+ve for coronavirus OC43, RSV 1/4>> blood cultures: No growth   Procedures: None   Consults: Card  Brief Hospital Course: Acute on chronic hypoxic/hypercarbic respiratory failure (on home O2-2-3 L) Secondary to COPD/CHF exacerbation Much improved with treatment of underlying etiologies-back  on her usual home regimen of 2-3 L. Continue bronchodilators/diuretics.   COPD exacerbation secondary to coronavirus/RSV infection Much improved Completed a course of steroids Continue scheduled bronchodilators   Acute on chronic HFrEF AICD in place Hypoxia/volume status has improved-she appears euvolemic Cardiology followed closely-she was briefly placed on BiDil -however she did not tolerate more than a few days due to low blood pressure-she will be discharged on Farxiga /Aldactone /digoxin /Demadex -she will continue low-dose midodrine . She will follow-up with her primary cardiologist at the CHF clinic for posthospital follow discharge visit.    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 Thankfully she has improved-and is stable for discharge today-however her overall prognosis is still very poor.   Nutrition Status: Nutrition Problem: Severe Malnutrition Etiology: chronic illness Signs/Symptoms: meal completion < 25%, severe fat depletion, severe muscle depletion Interventions: Ensure Enlive (each supplement provides 350kcal and 20 grams of protein), Liberalize Diet, MVI    Underweight: Estimated body mass index is  18.97 kg/m as  calculated from the following:   Height as of this encounter: 4' 11 (1.499 m).   Weight as of this encounter: 42.6 kg.    Discharge Diagnoses:  Principal Problem:   Respiratory distress Active Problems:   Acute combined systolic and diastolic CHF, NYHA class 4 (HCC)   Acute hypoxic respiratory failure (HCC)   COPD with acute exacerbation (HCC)   History of CAD (coronary artery disease)   Essential hypertension   Cardiomyopathy, ischemic   Acute kidney injury superimposed on chronic kidney disease (HCC)   Hx of hypokalemia   Discharge Instructions:  Activity:  As tolerated    Discharge Instructions     (HEART FAILURE PATIENTS) Call MD:  Anytime you have any of the following symptoms: 1) 3 pound weight gain in 24 hours or 5 pounds in 1 week 2) shortness of breath, with or without a dry hacking cough 3) swelling in the hands, feet or stomach 4) if you have to sleep on extra pillows at night in order to breathe.   Complete by: As directed    AMB referral to CHF clinic   Complete by: As directed    Needs follow-up post Hospital discharge visit.   Reason for referral: Post Hosp/TOC   Call MD for:  difficulty breathing, headache or visual disturbances   Complete by: As directed    Diet - low sodium heart healthy   Complete by: As directed    Discharge instructions   Complete by: As directed    Follow with Primary MD  Tanda Bleacher, MD in 1-2 weeks  Follow-up with your primary cardiologist in 1-2 weeks.  Please get a complete blood count and chemistry panel checked by your Primary MD at your next visit, and again as instructed by your Primary MD.  Get Medicines reviewed and adjusted: Please take all your medications with you for your next visit with your Primary MD  Laboratory/radiological data: Please request your Primary MD to go over all hospital tests and procedure/radiological results at the follow up, please ask your Primary MD to get all Hospital records sent to  his/her office.  In some cases, they will be blood work, cultures and biopsy results pending at the time of your discharge. Please request that your primary care M.D. follows up on these results.  Also Note the following: If you experience worsening of your admission symptoms, develop shortness of breath, life threatening emergency, suicidal or homicidal thoughts you must seek medical attention immediately by calling 911 or calling your MD immediately  if symptoms less severe.  You must read complete instructions/literature along with all the possible adverse reactions/side effects for all the Medicines you take and that have been prescribed to you. Take any new Medicines after you have completely understood and accpet all the possible adverse reactions/side effects.   Do not drive when taking Pain medications or sleeping medications (Benzodaizepines)  Do not take more than prescribed Pain, Sleep and Anxiety Medications. It is not advisable to combine anxiety,sleep and pain medications without talking with your primary care practitioner  Special Instructions: If you have smoked or chewed Tobacco  in the last 2 yrs please stop smoking, stop any regular Alcohol  and or any Recreational drug use.  Wear Seat belts while driving.  Please note: You were cared for by a hospitalist during your hospital stay. Once you are discharged, your primary care physician will handle any further medical issues. Please note that NO REFILLS for any discharge  medications will be authorized once you are discharged, as it is imperative that you return to your primary care physician (or establish a relationship with a primary care physician if you do not have one) for your post hospital discharge needs so that they can reassess your need for medications and monitor your lab values.   Heart Failure patients record your daily weight using the same scale at the same time of day   Complete by: As directed    Increase  activity slowly   Complete by: As directed    STOP any activity that causes chest pain, shortness of breath, dizziness, sweating, or exessive weakness   Complete by: As directed       Allergies as of 12/22/2023       Reactions   Lactose Intolerance (gi) Diarrhea   Codeine Nausea And Vomiting        Medication List     TAKE these medications    albuterol  108 (90 Base) MCG/ACT inhaler Commonly known as: VENTOLIN  HFA Inhale 2 puffs into the lungs every 6 (six) hours as needed for wheezing or shortness of breath.   atorvastatin  80 MG tablet Commonly known as: LIPITOR  Take 1 tablet (80 mg total) by mouth daily.   Corlanor  7.5 MG Tabs tablet Generic drug: ivabradine  TAKE 1 TABLET BY MOUTH TWICE A DAY WITH A MEAL   cyclobenzaprine  10 MG tablet Commonly known as: FLEXERIL  Take 1 tablet (10 mg total) by mouth 3 (three) times daily as needed for muscle spasms. What changed:  how much to take when to take this   Dextromethorphan -guaiFENesin  10-100 MG/5ML liquid Take 5 mLs by mouth every 6 (six) hours as needed for cough.   digoxin  0.125 MG tablet Commonly known as: LANOXIN  TAKE 1 TABLET BY MOUTH DAILY What changed: when to take this   Eliquis  2.5 MG Tabs tablet Generic drug: apixaban  TAKE 1 TABLET BY MOUTH TWICE A DAY   Ensure Original Liqd Take 1 Bottle by mouth See admin instructions. Drink 1 bottle 2-3 times daily.   Farxiga  10 MG Tabs tablet Generic drug: dapagliflozin  propanediol TAKE 1 TABLET BY MOUTH EVERY DAY   hydrOXYzine  25 MG capsule Commonly known as: VISTARIL  TAKE 1 CAPSULE (25 MG TOTAL) BY MOUTH DAILY AS NEEDED.   ipratropium-albuterol  0.5-2.5 (3) MG/3ML Soln Commonly known as: DUONEB Take 3 mLs by nebulization every 4 (four) hours as needed (for shortness of breath).   metolazone  2.5 MG tablet Commonly known as: ZAROXOLYN  Take 1 tablet (2.5 mg total) by mouth 2 (two) times a week. Take twice weekly on Tuesday and Thursday with extra 40 mEq of  Potassium   midodrine  5 MG tablet Commonly known as: PROAMATINE  Take 1 tablet (5 mg total) by mouth 3 (three) times daily with meals.   nitroGLYCERIN  0.4 MG SL tablet Commonly known as: NITROSTAT  Place 1 tablet (0.4 mg total) under the tongue every 5 (five) minutes as needed for chest pain.   potassium chloride  10 MEQ tablet Commonly known as: KLOR-CON  Take 4 tablets (40 mEq total) by mouth daily. Take extra 4 tablets (40 mEq total) with weekly Metolazone  dose What changed:  how much to take when to take this   spironolactone  25 MG tablet Commonly known as: ALDACTONE  Take 1 tablet (25 mg total) by mouth at bedtime. What changed: how much to take   torsemide  20 MG tablet Commonly known as: DEMADEX  Take 4 tablets (80 mg total) by mouth daily. What changed: when to take this  Torsemide  40 MG Tabs Take 80 mg by mouth daily as needed (For 3 pound weight gain in 24 hours, shortness of breath/leg edema/swelling.). What changed: You were already taking a medication with the same name, and this prescription was added. Make sure you understand how and when to take each.   Trelegy Ellipta  200-62.5-25 MCG/ACT Aepb Generic drug: Fluticasone -Umeclidin-Vilant Inhale 1 puff into the lungs daily.        Follow-up Information     Tanda Bleacher, MD. Schedule an appointment as soon as possible for a visit in 1 week(s).   Specialty: Family Medicine Contact information: [address] [place] [address] [phone]         Bensimhon, Toribio SAUNDERS, MD. Schedule an appointment as soon as possible for a visit in 1 week(s).   Specialty: Cardiology Contact information: [address] [place] [address] [phone]                Allergies  Allergen Reactions   Lactose Intolerance (Gi) Diarrhea   Codeine Nausea And Vomiting     Other Procedures/Studies: DG Chest Port 1 View Result Date: 12/18/2023 CLINICAL DATA:  [id number] with shortness  of breath. EXAM: PORTABLE CHEST 1 VIEW COMPARISON:  Portable chest 12/14/2023 FINDINGS: 4:27 a.m. The cardiac size is upper limits of normal. Old CABG changes are redemonstrated well as a left chest single lead AICD, right chest battery with single wire extending up the right neck and off of the film at C7. The lungs are emphysematous but clear. No pleural effusion is seen. The mediastinum is normally outlined. Aortic atherosclerosis. No new osseous findings. IMPRESSION: 1. No evidence of acute chest disease. Emphysema. 2. Old CABG changes. 3. Aortic atherosclerosis. Electronically Signed   By: Francis Quam M.D.   On: 12/18/2023 07:49   ECHOCARDIOGRAM LIMITED Result Date: 12/15/2023    ECHOCARDIOGRAM LIMITED REPORT   Patient Name:   Brittany [name]  Date of Exam: 12/15/2023 Medical Rec #:  [id number]               Height:       59.0 in Accession #:    [id number]              Weight:       96.1 lb Date of Birth:  Sep 11, 1961               BSA:          1.351 m Patient Age:    62 years                BP:           99/57 mmHg Patient Gender: F                       HR:           79 bpm. Exam Location:  Inpatient Procedure: Limited Echo, Limited Color Doppler and Intracardiac Opacification            Agent Indications:    CHF- Acute Diastolic I50.31, CHF- Acute Systolic I50.21  History:        Patient has prior history of Echocardiogram examinations, most                 recent 09/26/2023. CHF and Cardiomyopathy, Previous Myocardial                 Infarction, Angina and CAD, Defibrillator, Pacemaker and Prior  CABG, COPD and CKD, stage 3, Arrythmias:Atrial Fibrillation,                 Signs/Symptoms:Hypotension and Chest Pain; Risk                 Factors:Hypertension and Diabetes.  Sonographer:    Thea Norlander RCS Referring Phys: SUBRINA SUNDIL IMPRESSIONS  1. Left ventricular ejection fraction, by estimation, is <20%. The left ventricle has severely decreased function. The left ventricle  demonstrates global hypokinesis. The left ventricular internal cavity size was dilated. Left ventricular diastolic parameters are consistent with Grade II diastolic dysfunction (pseudonormalization).  2. Right ventricular systolic function moderate to severely reduced. The right ventricular size is normal.  3. Left atrial size was grossly normal.  4. Right atrial size was grossly normal.  5. The mitral valve is grossly normal. Mild mitral valve regurgitation. No evidence of mitral stenosis.  6. The aortic valve was not well visualized. Aortic valve regurgitation is moderate. Aortic valve sclerosis is present, with no evidence of aortic valve stenosis.  7. The inferior vena cava is normal in size with greater than 50% respiratory variability, suggesting right atrial pressure of 3 mmHg. Comparison(s): A prior study was performed on 09/26/2023. LVEF 20-25%, global hypokinesis, moderately dilated ventricle, grade 2 diastolic dysfunction, RVSP 36 mmHg, see report for additional details. Conclusion(s)/Recommendation(s): No left ventricular mural or apical thrombus/thrombi. FINDINGS  Left Ventricle: Left ventricular ejection fraction, by estimation, is <20%. The left ventricle has severely decreased function. The left ventricle demonstrates global hypokinesis. Definity  contrast agent was given IV to delineate the left ventricular endocardial borders. The left ventricular internal cavity size was dilated. There is no left ventricular hypertrophy. Left ventricular diastolic parameters are consistent with Grade II diastolic dysfunction (pseudonormalization). Right Ventricle: The right ventricular size is normal. Right ventricular systolic function moderate to severely reduced. Left Atrium: Left atrial size was grossly normal. Right Atrium: Right atrial size was grossly normal. Mitral Valve: The mitral valve is grossly normal. Mild mitral valve regurgitation. No evidence of mitral valve stenosis. Tricuspid Valve: The tricuspid  valve is normal in structure. Tricuspid valve regurgitation is mild . No evidence of tricuspid stenosis. Aortic Valve: The aortic valve was not well visualized. Aortic valve regurgitation is moderate. Aortic regurgitation PHT measures 265 msec. Aortic valve sclerosis is present, with no evidence of aortic valve stenosis. Aortic valve peak gradient measures 16.3 mmHg. Pulmonic Valve: The pulmonic valve was not well visualized. Pulmonic valve regurgitation is not visualized. No evidence of pulmonic stenosis. Aorta: The aortic root and ascending aorta are structurally normal, with no evidence of dilitation. Venous: The inferior vena cava is normal in size with greater than 50% respiratory variability, suggesting right atrial pressure of 3 mmHg. IAS/Shunts: There is right bowing of the interatrial septum, suggestive of elevated left atrial pressure. Additional Comments: A device lead is visualized.  LEFT VENTRICLE PLAX 2D LVIDd:         6.00 cm   Diastology LVIDs:         5.40 cm   LV e' medial:    5.77 cm/s LV PW:         0.90 cm   LV E/e' medial:  9.8 LV IVS:        0.40 cm   LV e' lateral:   11.20 cm/s LVOT diam:     1.70 cm   LV E/e' lateral: 5.1 LVOT Area:     2.27 cm  RIGHT VENTRICLE  IVC RV S prime:     7.62 cm/s  IVC diam: 1.10 cm TAPSE (M-mode): 1.1 cm LEFT ATRIUM         Index LA diam:    3.80 cm 2.81 cm/m  AORTIC VALVE AV Vmax:      201.67 cm/s AV Peak Grad: 16.3 mmHg AI PHT:       265 msec  AORTA Ao Root diam: 2.90 cm Ao Asc diam:  2.40 cm MITRAL VALVE MV Area (PHT): 4.54 cm    SHUNTS MV Decel Time: 167 msec    Systemic Diam: 1.70 cm MR Peak grad: 56.9 mmHg MR Vmax:      377.00 cm/s MV E velocity: 56.60 cm/s MV A velocity: 29.00 cm/s MV E/A ratio:  1.95 Sunit Tolia Electronically signed by Madonna Large Signature Date/Time: 12/15/2023/5:06:25 PM    Final    DG Chest Portable 1 View Result Date: 12/14/2023 CLINICAL DATA:  09/25/2023 EXAM: PORTABLE CHEST 1 VIEW COMPARISON:  09/25/2023 FINDINGS:  Cardiac shadow is stable. Defibrillator and postsurgical changes are again noted. Stimulator pack is noted in the right chest. Lungs are hyperinflated but clear. No bony abnormality is seen. IMPRESSION: No acute abnormality noted. Electronically Signed   By: Oneil Devonshire M.D.   On: 12/14/2023 22:56     TODAY-DAY OF DISCHARGE:  Subjective:   Brittany [name]  today has no headache,no chest abdominal pain,no new weakness tingling or numbness, feels much better wants to go home today.  Objective:   Blood pressure (!) 98/59, pulse 87, temperature 98.2 F (36.8 C), temperature source Oral, resp. rate 17, height 4' 11 (1.499 m), weight 42.6 kg, SpO2 97%.  Intake/Output Summary (Last 24 hours) at 12/22/2023 0856 Last data filed at 12/21/2023 1630 Gross per 24 hour  Intake --  Output 400 ml  Net -400 ml   Filed Weights   12/18/23 0708 12/19/23 0500 12/20/23 0500  Weight: 49.1 kg 47.2 kg 42.6 kg    Exam: Awake Alert, Oriented *3, No new F.N deficits, Normal affect [employer].AT,PERRAL Supple Neck,No JVD, No cervical lymphadenopathy appriciated.  Symmetrical Chest wall movement, Good air movement bilaterally, CTAB RRR,No Gallops,Rubs or new Murmurs, No Parasternal Heave +ve B.Sounds, Abd Soft, Non tender, No organomegaly appriciated, No rebound -guarding or rigidity. No Cyanosis, Clubbing or edema, No new Rash or bruise   PERTINENT RADIOLOGIC STUDIES: No results found.   PERTINENT LAB RESULTS: CBC: No results for input(s): WBC, HGB, HCT, PLT in the last 72 hours. CMET CMP     Component Value Date/Time   NA 132 (L) 12/22/2023 0546   NA 141 10/11/2023 1110   NA 142 12/23/2014 0829   K 4.1 12/22/2023 0546   K 3.9 12/23/2014 0829   CL 86 (L) 12/22/2023 0546   CO2 32 12/22/2023 0546   CO2 26 12/23/2014 0829   GLUCOSE 164 (H) 12/22/2023 0546   GLUCOSE 110 12/23/2014 0829   BUN 61 (H) 12/22/2023 0546   BUN 26 10/11/2023 1110   BUN 7.2 12/23/2014 0829   CREATININE 1.67  (H) 12/22/2023 0546   CREATININE 1.19 (H) 01/13/2020 1406   CREATININE 0.7 12/23/2014 0829   CALCIUM  9.8 12/22/2023 0546   CALCIUM  9.3 12/23/2014 0829   PROT 7.7 12/15/2023 0208   PROT 7.3 07/19/2021 0945   PROT 7.1 12/23/2014 0829   ALBUMIN  3.9 12/15/2023 0208   ALBUMIN  4.8 07/19/2021 0945   ALBUMIN  4.2 12/23/2014 0829   AST 30 12/15/2023 0208   AST 18 01/13/2020 1406   AST 20  12/23/2014 0829   ALT 18 12/15/2023 0208   ALT 10 01/13/2020 1406   ALT 20 12/23/2014 0829   ALKPHOS 93 12/15/2023 0208   ALKPHOS 98 12/23/2014 0829   BILITOT 1.0 12/15/2023 0208   BILITOT 0.9 07/19/2021 0945   BILITOT 0.8 01/13/2020 1406   BILITOT 0.69 12/23/2014 0829   EGFR 42 (L) 10/11/2023 1110   GFRNONAA 34 (L) 12/22/2023 0546   GFRNONAA 50 (L) 01/13/2020 1406    GFR Estimated Creatinine Clearance: 23.2 mL/min (A) (by C-G formula based on SCr of 1.67 mg/dL (H)). No results for input(s): LIPASE, AMYLASE in the last 72 hours. No results for input(s): CKTOTAL, CKMB, CKMBINDEX, TROPONINI in the last 72 hours. Invalid input(s): POCBNP No results for input(s): DDIMER in the last 72 hours. No results for input(s): HGBA1C in the last 72 hours. No results for input(s): CHOL, HDL, LDLCALC, TRIG, CHOLHDL, LDLDIRECT in the last 72 hours. No results for input(s): TSH, T4TOTAL, T3FREE, THYROIDAB in the last 72 hours.  Invalid input(s): FREET3 No results for input(s): VITAMINB12, FOLATE, FERRITIN, TIBC, IRON, RETICCTPCT in the last 72 hours. Coags: No results for input(s): INR in the last 72 hours.  Invalid input(s): PT Microbiology: Recent Results (from the past 240 hours)  Resp panel by RT-PCR (RSV, Flu A&B, Covid) Anterior Nasal Swab     Status: None   Collection Time: 12/14/23  9:31 PM   Specimen: Anterior Nasal Swab  Result Value Ref Range Status   SARS Coronavirus 2 by RT PCR NEGATIVE NEGATIVE Final   Influenza A by PCR NEGATIVE NEGATIVE  Final   Influenza B by PCR NEGATIVE NEGATIVE Final    Comment: (NOTE) The Xpert Xpress SARS-CoV-2/FLU/RSV plus assay is intended as an aid in the diagnosis of influenza from Nasopharyngeal swab specimens and should not be used as a sole basis for treatment. Nasal washings and aspirates are unacceptable for Xpert Xpress SARS-CoV-2/FLU/RSV testing.  Fact Sheet for Patients: bloggercourse.com  Fact Sheet for Healthcare Providers: seriousbroker.it  This test is not yet approved or cleared by the [place]  FDA and has been authorized for detection and/or diagnosis of SARS-CoV-2 by FDA under an Emergency Use Authorization (EUA). This EUA will remain in effect (meaning this test can be used) for the duration of the COVID-19 declaration under Section 564(b)(1) of the Act, 21 U.S.C. section 360bbb-3(b)(1), unless the authorization is terminated or revoked.     Resp Syncytial Virus by PCR NEGATIVE NEGATIVE Final    Comment: (NOTE) Fact Sheet for Patients: bloggercourse.com  Fact Sheet for Healthcare Providers: seriousbroker.it  This test is not yet approved or cleared by the [place]  FDA and has been authorized for detection and/or diagnosis of SARS-CoV-2 by FDA under an Emergency Use Authorization (EUA). This EUA will remain in effect (meaning this test can be used) for the duration of the COVID-19 declaration under Section 564(b)(1) of the Act, 21 U.S.C. section 360bbb-3(b)(1), unless the authorization is terminated or revoked.  Performed at [facility], [address], [place], [place] [address]   Respiratory (~20 pathogens) panel by PCR     Status: Abnormal   Collection Time: 12/15/23  1:00 AM   Specimen: Nasopharyngeal Swab; Respiratory  Result Value Ref Range Status   Adenovirus NOT DETECTED NOT DETECTED Final   Coronavirus 229E NOT DETECTED NOT DETECTED Final     Comment: (NOTE) The Coronavirus on the Respiratory Panel, DOES NOT test for the novel  Coronavirus (2019 nCoV)    Coronavirus HKU1 NOT  DETECTED NOT DETECTED Final   Coronavirus NL63 NOT DETECTED NOT DETECTED Final   Coronavirus OC43 DETECTED (A) NOT DETECTED Final   Metapneumovirus NOT DETECTED NOT DETECTED Final   Rhinovirus / Enterovirus NOT DETECTED NOT DETECTED Final   Influenza A NOT DETECTED NOT DETECTED Final   Influenza B NOT DETECTED NOT DETECTED Final   Parainfluenza Virus 1 NOT DETECTED NOT DETECTED Final   Parainfluenza Virus 2 NOT DETECTED NOT DETECTED Final   Parainfluenza Virus 3 NOT DETECTED NOT DETECTED Final   Parainfluenza Virus 4 NOT DETECTED NOT DETECTED Final   Respiratory Syncytial Virus DETECTED (A) NOT DETECTED Final   Bordetella pertussis NOT DETECTED NOT DETECTED Final   Bordetella Parapertussis NOT DETECTED NOT DETECTED Final   Chlamydophila pneumoniae NOT DETECTED NOT DETECTED Final   Mycoplasma pneumoniae NOT DETECTED NOT DETECTED Final    Comment: Performed at [facility], [address] [place], [place] [address]  Culture, blood (Routine X 2) w Reflex to ID Panel     Status: None   Collection Time: 12/15/23  1:05 AM   Specimen: BLOOD LEFT ARM  Result Value Ref Range Status   Specimen Description BLOOD LEFT ARM  Final   Special Requests   Final    BOTTLES DRAWN AEROBIC ONLY Blood Culture results may not be optimal due to an inadequate volume of blood received in culture bottles   Culture   Final    NO GROWTH 5 DAYS Performed at [facility], [address] [place], [place] [address]    Report Status 12/20/2023 FINAL  Final  Culture, blood (Routine X 2) w Reflex to ID Panel     Status: None   Collection Time: 12/15/23  2:07 AM   Specimen: BLOOD RIGHT ARM  Result Value Ref Range Status   Specimen Description BLOOD RIGHT ARM  Final   Special Requests   Final    BOTTLES DRAWN AEROBIC AND ANAEROBIC Blood Culture adequate volume    Culture   Final    NO GROWTH 5 DAYS Performed at [facility], [address] [place], [place] [address]    Report Status 12/20/2023 FINAL  Final    FURTHER DISCHARGE INSTRUCTIONS:  Get Medicines reviewed and adjusted: Please take all your medications with you for your next visit with your Primary MD  Laboratory/radiological data: Please request your Primary MD to go over all hospital tests and procedure/radiological results at the follow up, please ask your Primary MD to get all Hospital records sent to his/her office.  In some cases, they will be blood work, cultures and biopsy results pending at the time of your discharge. Please request that your primary care M.D. goes through all the records of your hospital data and follows up on these results.  Also Note the following: If you experience worsening of your admission symptoms, develop shortness of breath, life threatening emergency, suicidal or homicidal thoughts you must seek medical attention immediately by calling 911 or calling your MD immediately  if symptoms less severe.  You must read complete instructions/literature along with all the possible adverse reactions/side effects for all the Medicines you take and that have been prescribed to you. Take any new Medicines after you have completely understood and accpet all the possible adverse reactions/side effects.   Do not drive when taking Pain medications or sleeping medications (Benzodaizepines)  Do not take more than prescribed Pain, Sleep and Anxiety Medications. It is not advisable to combine anxiety,sleep and pain  medications without talking with your primary care practitioner  Special Instructions: If you have smoked or chewed Tobacco  in the last 2 yrs please stop smoking, stop any regular Alcohol  and or any Recreational drug use.  Wear Seat belts while driving.  Please note: You were cared for by a hospitalist during your hospital stay. Once you are discharged,  your primary care physician will handle any further medical issues. Please note that NO REFILLS for any discharge medications will be authorized once you are discharged, as it is imperative that you return to your primary care physician (or establish a relationship with a primary care physician if you do not have one) for your post hospital discharge needs so that they can reassess your need for medications and monitor your lab values.  Total Time spent coordinating discharge including counseling, education and face to face time equals greater than 30 minutes.  SignedBETHA Donalda Applebaum 12/22/2023 8:56 AM

## 2023-12-22 NOTE — TOC Transition Note (Addendum)
 Transition of Care [facility]) - Discharge Note   Patient Details  Name: Brittany [name]  MRN: [id number] Date of Birth: May 01, 1961  [facility] CM/SW Contact:  Marval Gell, RN Phone Number: 12/22/2023, 9:09 AM   Clinical Narrative:     [name] w patient at bedside. She states that she has medical assistants at home and declined [facility] services.  She states she has home oxygen, and would also like a 3/1. I have ordered this through [employer] to be delivered to her room. She states that she has transportation home  .   [employer] will deliver tank for transport to her room    Final next level of care: Home/Self Care Barriers to Discharge: No Barriers Identified   Patient Goals and CMS Choice Patient states their goals for this hospitalization and ongoing recovery are:: to return home          Discharge Placement                       Discharge Plan and Services Additional resources added to the After Visit Summary for                  DME Arranged: Bedside commode DME Agency: [employer] Date DME Agency Contacted: 12/22/23 Time DME Agency Contacted: [id number] Representative spoke with at DME Agency: [facility] Arranged: Refused HH          Social Drivers of Health (SDOH) Interventions SDOH Screenings   Food Insecurity: No Food Insecurity (12/16/2023)  Housing: Low Risk  (12/16/2023)  Transportation Needs: Unmet Transportation Needs (12/16/2023)  Utilities: Not At Risk (12/16/2023)  Alcohol Screen: Low Risk  (02/11/2021)  Depression (PHQ2-9): Low Risk  (05/30/2023)  Financial Resource Strain: Medium Risk (02/11/2021)  Physical Activity: Insufficiently Active (02/11/2021)  Social Connections: Unknown (02/11/2021)  Stress: Stress Concern Present (02/11/2021)  Tobacco Use: Medium Risk (12/15/2023)     Readmission Risk Interventions    06/20/2022   10:00 AM 04/11/2021   10:16 AM  Readmission Risk Prevention Plan  Transportation Screening Complete Complete   PCP or Specialist Appt within 3-5 Days Complete Complete  HRI or Home Care Consult Complete Complete  Social Work Consult for Recovery Care Planning/Counseling Complete Complete  Palliative Care Screening Not Applicable Not Applicable  Medication Review Oceanographer) Complete Complete

## 2023-12-24 ENCOUNTER — Other Ambulatory Visit: Payer: Self-pay | Admitting: Pulmonary Disease

## 2023-12-24 ENCOUNTER — Telehealth: Payer: Self-pay

## 2023-12-24 NOTE — Transitions of Care (Post Inpatient/ED Visit) (Signed)
   12/24/2023  Name: Brittany [name]  MRN: [id number] DOB: 04-30-1961  Today's TOC FU Call Status: Today's TOC FU Call Status:: Unsuccessful Call (1st Attempt) Unsuccessful Call (1st Attempt) Date: 12/24/23  Attempted to reach the patient regarding the most recent Inpatient/ED visit.  Follow Up Plan: Additional outreach attempts will be made to reach the patient to complete the Transitions of Care (Post Inpatient/ED visit) call.   Signature Slater Diesel, RN

## 2023-12-25 ENCOUNTER — Telehealth: Payer: Self-pay

## 2023-12-25 NOTE — Transitions of Care (Post Inpatient/ED Visit) (Signed)
   12/25/2023  Name: Brittany [name]  MRN: [id number] DOB: 07-Mar-1961  Today's TOC FU Call Status: Today's TOC FU Call Status:: Unsuccessful Call (2nd Attempt) Unsuccessful Call (1st Attempt) Date: 12/24/23 Unsuccessful Call (2nd Attempt) Date: 12/25/23  Attempted to reach the patient regarding the most recent Inpatient/ED visit.  Follow Up Plan: Additional outreach attempts will be made to reach the patient to complete the Transitions of Care (Post Inpatient/ED visit) call.   Signature Slater Diesel, RN

## 2023-12-26 ENCOUNTER — Other Ambulatory Visit: Payer: Self-pay

## 2023-12-26 ENCOUNTER — Telehealth: Payer: Self-pay

## 2023-12-26 NOTE — Telephone Encounter (Signed)
 I have pended the requested rxns to this message. Routing to PCP for approval.

## 2023-12-26 NOTE — Progress Notes (Signed)
 No ICM remote transmission received for 12/24/2023 and next ICM transmission scheduled for 01/01/2024.

## 2023-12-26 NOTE — Telephone Encounter (Signed)
 From the [facility] call:  The patient needs refills of her albuterol  inhaler and her duoneb.   She is also requesting another referral to pulmonary.    thanks

## 2023-12-26 NOTE — Transitions of Care (Post Inpatient/ED Visit) (Signed)
   12/26/2023  Name: Brittany [name]  MRN: [id number] DOB: 05-Aug-1961  Today's TOC FU Call Status: Today's TOC FU Call Status:: Successful TOC FU Call Completed Unsuccessful Call (1st Attempt) Date: 12/24/23 Unsuccessful Call (2nd Attempt) Date: 12/25/23 [facility] FU Call Complete Date: 12/26/23 Patient's Name and Date of Birth confirmed.  Transition Care Management Follow-up Telephone Call Date of Discharge: 12/22/23 Discharge Facility: [facility]) Type of Discharge: Inpatient Admission Primary Inpatient Discharge Diagnosis:: respiratory distress How have you been since you were released from the hospital?: Better Any questions or concerns?: Yes Patient Questions/Concerns:: She would like another referral to pulmonary Patient Questions/Concerns Addressed: Notified Provider of Patient Questions/Concerns  Items Reviewed: Did you receive and understand the discharge instructions provided?: Yes Medications obtained,verified, and reconciled?: Partial Review Completed Reason for Partial Mediation Review: She said she has all of her medications except the albuterol  inhaler and duoneb. I told her that I would notify Dr Elvan Hamel.  She said she did not have any questions about the med regime and did not need to review the med list.  She confirmed that she has a nebulizer Any new allergies since your discharge?: No Dietary orders reviewed?: Yes Type of Diet Ordered:: heart healthy, low sodium.  1200 ml /day fluid restriction Do you have support at home?: Yes People in Home: alone Name of Support/Comfort Primary Source: she said her sister is very supportive and checks on her daily.  Medications Reviewed Today: Medications Reviewed Today   Medications were not reviewed in this encounter     Home Care and Equipment/Supplies: Were Home Health Services Ordered?: No Any new equipment or medical supplies ordered?: Yes Name of Medical supply agency?: [employer]- 3:1 commode. Were you able to  get the equipment/medical supplies?: No (She said her discharge was rushed and she  left it at the hospital and will need to call the floor and inquire where it is and what she needs to do next.) Do you have any questions related to the use of the equipment/supplies?: No  Functional Questionnaire: Do you need assistance with bathing/showering or dressing?: Yes (She has an aide that assists.  Her aide comes 3 hours x 3 days/ week.  She said she has a scale and us  keeping a log of daily weights,) Do you need assistance with meal preparation?: No Do you need assistance with eating?: No Do you have difficulty maintaining continence: No Do you need assistance with getting out of bed/getting out of a chair/moving?: Yes (She has a rollator to use as needed. She also uses O2 @ 2l when needed and she has a BiPAP.) Do you have difficulty managing or taking your medications?: No  Follow up appointments reviewed: PCP Follow-up appointment confirmed?: Yes Date of PCP follow-up appointment?: 01/15/24 Follow-up Provider: Dr Elvan Hamel   I explained that she has the option of going to the [facility] to be seen sooner and she said she did not want that, she will wait to see Dr Elvan Hamel [facility] Follow-up appointment confirmed?: Yes Date of Specialist follow-up appointment?: 01/09/24 Follow-Up Specialty Provider:: cardiology Do you need transportation to your follow-up appointment?: No Do you understand care options if your condition(s) worsen?: Yes-patient verbalized understanding    SIGNATURE Burnett Carson, RN

## 2023-12-31 ENCOUNTER — Ambulatory Visit (INDEPENDENT_AMBULATORY_CARE_PROVIDER_SITE_OTHER): Payer: BC Managed Care – PPO

## 2023-12-31 ENCOUNTER — Encounter: Payer: Self-pay | Admitting: Cardiology

## 2023-12-31 DIAGNOSIS — I5022 Chronic systolic (congestive) heart failure: Secondary | ICD-10-CM | POA: Diagnosis not present

## 2023-12-31 LAB — CUP PACEART REMOTE DEVICE CHECK
Battery Remaining Longevity: 80 mo
Battery Voltage: 3.01 V
Brady Statistic RV Percent Paced: 0.01 %
Date Time Interrogation Session: 20250120043723
HighPow Impedance: 84 Ohm
Implantable Lead Connection Status: 753985
Implantable Lead Implant Date: 20190905
Implantable Lead Location: 753860
Implantable Pulse Generator Implant Date: 20190905
Lead Channel Impedance Value: 380 Ohm
Lead Channel Impedance Value: 494 Ohm
Lead Channel Pacing Threshold Amplitude: 0.625 V
Lead Channel Pacing Threshold Pulse Width: 0.4 ms
Lead Channel Sensing Intrinsic Amplitude: 31.625 mV
Lead Channel Sensing Intrinsic Amplitude: 31.625 mV
Lead Channel Setting Pacing Amplitude: 2 V
Lead Channel Setting Pacing Pulse Width: 0.4 ms
Lead Channel Setting Sensing Sensitivity: 0.3 mV
Zone Setting Status: 755011
Zone Setting Status: 755011

## 2024-01-01 ENCOUNTER — Ambulatory Visit: Payer: BC Managed Care – PPO | Attending: Cardiology

## 2024-01-01 DIAGNOSIS — Z95810 Presence of automatic (implantable) cardiac defibrillator: Secondary | ICD-10-CM

## 2024-01-01 DIAGNOSIS — I5022 Chronic systolic (congestive) heart failure: Secondary | ICD-10-CM

## 2024-01-03 NOTE — Progress Notes (Addendum)
EPIC Encounter for ICM Monitoring  Patient Name: Brittany [name] is a 63 y.o. female Date: 01/03/2024 Primary Care Physican: Georganna Skeans, MD Primary Cardiologist: Bensimhon Electrophysiologist: Lalla Brothers 02/13/2023 Weight: 100-102 lbs 03/20/2023 Weight: 100 lbs 05/22/2023 Weight: 96 lbs 06/26/2023 Weight: 96-98 lbs 08/22/2023 Weight: 93 lbs 10/23/2023 Weight: 93-94 lbs 11/13/2023 Weight: 94 lbs  01/03/2024 Weight: 93-94 lbs    Spoke with patient and heart failure questions reviewed.  Transmission results reviewed. Patient was hospitalized 1/3-1/11 and she did not feel like she did not get good care during hospitalization.   She is feeling better since discharge.    Optivol Thoracic impedance suggesting possible dryness since 1/11 hospital discharge but trending back to baseline.   Prescribed: Torsemide 20 mg take 4 tablets (80 mg total) by mouth daily.  01/03/2024 Pt report taking 60 mg instead 80 mg daily since hospital discharge on 1/11.  Torsemide 40 mg take 80 mg daily as needed (For 3 lbs weight gain in 24 hours, SOB, edema/leg swelling).     Potassium 10 mEq take 4 tablets (40 mEq total) by mouth daily.  Take extra 4 tablets (40 mEq total) with weekly Metolazone.   Spironolactone 25 mg take 0.5 tablet (12.5 mg total) by mouth at bedtime Metolazone 2.5 mg take 1 tablet by mouth twice a week on Tues & Thurs with extra 40 mEq Potassium with each dosage.  01/03/2024 Pt reports she has not resumed Metolazone since hospital discharge on 1/11   Labs: 12/22/2023 Creatinine 1.67, BUN 61, Potassium 4.1, Sodium 132, GFR 34  12/21/2023 Creatinine 1.53, BUN 61, Potassium 3.6, Sodium 138, GFR 38  12/20/2023 Creatinine 1.60, BUN 70, Potassium 3.2, Sodium 135, GFR 36  12/19/2023 Creatinine 1.99, BUN 79, Potassium 4.7, Sodium 133, GFR 28 12/18/2023 Creatinine 1.86, BUN 63, Potassium 4.1, Sodium 135, GFR 30  12/17/2023 Creatinine 1.74, BUN 59, Potassium 4.2, Sodium 133, GFR 33  A complete set of  results can be found in Results Review.   Recommendations: She reports increasing fluid intake because she felt dry.       Follow-up plan: ICM clinic phone appointment on 01/14/2024.   91 day device clinic remote transmission 03/31/2024.     EP/Cardiology Office Visits:  01/09/2024 with [facility] Clinic.  Will schedule missed appt with Dr Lalla Brothers after the first of the year.  Missed 10/10/2023 with Dr Lalla Brothers and not rescheduled.   Copy of ICM check sent to Dr. Lalla Brothers.  3 month ICM trend: 01/01/2024.    12-14 Month ICM trend:     Karie Soda, RN 01/03/2024 12:53 PM

## 2024-01-08 NOTE — Progress Notes (Signed)
Advanced Heart Failure Clinic Note  PCP:  Georganna Skeans, MD  Oncology: Dr Shirline Frees  HF Cardiologist: Dr [name]  Chief Complaint: Hospital follow-up  HPI: Brittany [name] is a 63 y.o. female with a history of PAF,  previous smoker quit 2015  years ago, hypertension, previous small cell lung cancer treated with chemo, chest XRT and prophylactic brain radiation in 2015, CAD s/p CABG and chronic systolic HF EF ~25%  Admitted 1/61/0960 with NSTEMI and shock. Underwent emergent cath 02/03/18 showed LAD 100% stenosed, LCx 95% stenosed. Taken for emergent CABG 02/03/18. Required impella post op. Hospital course complicated by cardiogenic shock, HCAP, A fib, respiratory failure, and swallowing issues. She was discharged to SNF. Discharge weight 103 pounds.    In 2019 had multiple hospitalizations for HF and pleural effusion. Underwent pleurodesis at [facility].   Admitted 3/20 with NSTEMI and HF. Received DES to ostial ramus into distal left main based on cath below and underwent diuresis. Meds adjusted as tolerated. Echo with EF 25-30%.   Admitted 11/21 with COPD flare with mild HF and hemoptysis. CTA showed stable scarring in the right hilum consistent with emphysema.. Underwent bronchoscopy on 11/15, no obvious source of bleeding found on examination.   Echo 11/21 EF 20-25%. RV mildly HK.   Admitted 3/22 Echo with EF < 20%. Treated for AECOPD exacerbation.   Admitted 12/22 with R renal infarct felt to be cardio-embolic (no AF found). Started on Eliquis  Admitted 8/23 w/ a/c respiratory failure and CHF. Concern for low output vs PNA vs COPD exacerbation. PICC placed and milrinone started. Diuresed w/ IV Lasix. Given prednisone taper and doxy for COPD exacerbation. Echo showed EF 20-25%, global hypokinesis, GIIDD, RV systolic function, mildly reduced. RV size normal. Mild MR, trivial TR, aortic valve w/ mild-mod regurg.  Barostim placed.   Admitted 12/23 with COPDE, a/c CHF and  influenza +. Placed on BiPap, treated with tamiflu and diuresed with IV lasix. Empirically started milrinone over concern for low output  Admitted 5/24 with N/V. CT ok, wet prep + trich, treated with IV Flagyl.  HsTroponin elevated. Cards consulted felt elevated troponin 2/2 demand ischemia. Echo showed EF 20-25%, LV with global HK, RV mildly reduced, moderate AI. [name] and KCL stopped with AKI and she was discharged home, weight 101 lbs.  Admitted 8/24 with a/c CHF and AECOPD. Treated with steroids and duonebs and diuresed with IV lasix + metolazone. She was seen by Advanced Heart Failure. Discharged home on 80 mg Torsemide BID.  Admitted 10/24 with COPD + HF. This is the 3rd admit in 6 months. Diuresed with IV lasix 7 metolazone, required BiPap and steroids. Echo showed EF 20-25%, G2DD, RV mildly reduced. Able to wean to 3L [place]. She was discharged home, weight 91 lbs. She remained DNR during hospitalization.  Post hospital follow up 10/24, she reduced her diuretic dose due to frequent urination. NYHA III and volume up, weight up 8 lbs.Torsemide increased back to 80 bid, added weekly metolazone/extra KCL  Admitted 1/25 with AECOPD & a/c HF. RSV +. Required BiPap. Echo showed EF < 20%, G2DD, RV moderately to severely reduced. She remained DNR.  Today she returns for post hospital HF follow up. Overall feeling fine. Breathing has been stable at home. No longer has HH aide. Sister is in town and checks in on her. Denies palpitations, CP, dizziness, edema, abnormal bleeding or PND/Orthopnea. Appetite fair, drinking Ensure. Frustrated with weight loss. No fever or chills. Weight at home 92 pounds. Taking  all medications. Frustrated over her recent hospitalization. Has BiPap at home but has not needed it. Endorses feelings of depressed mood.   Cardidac Studies: - Echo (1/25): EF < 20%, G2DD, RV moderately to severely reduced.  - Echo (10/24): EF 20-25%, G2DD, RV mildly reduced.  - Echo (5/24): EF  20-25%, LV with global HK, RV mildly reduced, moderate AI.  - Echo (7/23): EF 20-25%, global hypokinesis, GIIDD, RV systolic function, mildly reduced. RV size normal. Mild MR, trivial TR, aortic valve w/ mild-mod regurg  - Echo 1/23: EF 20-25%, severely reduced LV with global HK, grade II DD, normal RV  - Echo 02/10/21: EF <20%, G2DD  - Echo 2022: EF < 20% RV normal   - Echo 2021: EF 20-25% RV mildly reduced.   - Echo 02/12/19: EF 25-30%, RV mildly reduced  - 05/18/2021 PFT's: moderately severe airway obstruction and a diffusion defect suggesting emphysema.   - LHC 02/12/19: Ost LAD to Prox LAD lesion is 100% stenosed. Ost Cx to Prox Cx lesion is 100% stenosed. Ost Ramus lesion is 99% stenosed. Post intervention, there is a 0% residual stenosis. A drug-eluting stent was successfully placed using a STENT RESOLUTE ONYX 2.25X15. SVG and is normal in caliber. The graft exhibits no disease. Ost 1st Diag lesion is 70% stenosed. SVG and is normal in caliber. Prox Graft lesion is 100% stenosed. 1.  Significant underlying two-vessel coronary artery disease with patent SVG to OM and SVG to diagonal which supplies the LAD territory.  Atretic LIMA to LAD given competitive flow from SVG to diagonal.  Severe ostial ramus artery stenosis not supplied by bypass with his area suggestive of plaque rupture. 2.  Severely elevated left ventricular end-diastolic pressure 34 mmHg.  Left ventricular angiography was not performed. 3.  Successful angioplasty and drug-eluting stent placement to the ostial ramus extending into the distal left main.  - RHC 2019  RA = 8 RV = 49/9 PA = 47/20 (32) PCW = 19 Fick cardiac output/index = 4.3/3.1 PVR = 3.0 wu AO sat = 93% PA sat = 62%, 64%   1. Minimally elevated left heart pressures 2. Mild PAH 3. Normal cardiac output  Past Medical History:  Diagnosis Date   Acute respiratory failure (HCC) 05/05/2018   Acute systolic congestive heart failure (HCC) 02/03/2018    AICD (automatic cardioverter/defibrillator) present    Allergy    Anxiety    Asthma    DM2 (diabetes mellitus, type 2) (HCC) 10/20/2020   Hypertension    PAF (paroxysmal atrial fibrillation) (HCC)    Presence of permanent cardiac pacemaker    Prophylactic measure 08/03/14-08/19/14   Prophyl. 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   LIMA to LAD, SVG to D1, SVG to OM1, EVH via right thigh with implantation of Impella LD LVAD via direct aortic approach   Small cell lung cancer (HCC) 03/16/2014   Past Surgical History:  Procedure Laterality Date   BRONCHIAL BRUSHINGS  10/25/2020   Procedure: BRONCHIAL BRUSHINGS;  Surgeon: Leslye Peer, MD;  Location: [facility] ENDOSCOPY;  Service: Pulmonary;;   BRONCHIAL NEEDLE ASPIRATION BIOPSY  10/25/2020   Procedure: BRONCHIAL NEEDLE ASPIRATION BIOPSIES;  Surgeon: Leslye Peer, MD;  Location: [facility] ENDOSCOPY;  Service: Pulmonary;;   CARDIAC DEFIBRILLATOR PLACEMENT  08/15/2018   MDT Visia AF MRI VR ICD implanted by Dr Georgena Spurling for primary prevention of sudden   CESAREAN SECTION     CORONARY ARTERY BYPASS GRAFT N/A 02/03/2018   Procedure: CORONARY  ARTERY BYPASS GRAFTING (CABG);  Surgeon: Purcell Nails, MD;  Location: [facility] OR;  Service: Open Heart Surgery;  Laterality: N/A;  Time 3 using left internal mammary artery and endoscopically harvested right saphenous vein   CORONARY BALLOON ANGIOPLASTY N/A 02/03/2018   Procedure: CORONARY BALLOON ANGIOPLASTY;  Surgeon: Swaziland, Peter M, MD;  Location: [facility] INVASIVE CV LAB;  Service: Cardiovascular;  Laterality: N/A;   CORONARY STENT INTERVENTION N/A 02/12/2019   Procedure: CORONARY STENT INTERVENTION;  Surgeon: Iran Ouch, MD;  Location: [facility] INVASIVE CV LAB;  Service: Cardiovascular;  Laterality: N/A;   CORONARY/GRAFT ACUTE MI REVASCULARIZATION N/A 02/03/2018   Procedure: Coronary/Graft Acute MI Revascularization;  Surgeon: Swaziland, Peter M, MD;  Location: [facility] INVASIVE CV LAB;  Service: Cardiovascular;   Laterality: N/A;   ENDOBRONCHIAL ULTRASOUND N/A 10/25/2020   Procedure: ENDOBRONCHIAL ULTRASOUND;  Surgeon: Leslye Peer, MD;  Location: [facility] ENDOSCOPY;  Service: Pulmonary;  Laterality: N/A;   FLEXIBLE BRONCHOSCOPY  10/25/2020   Procedure: FLEXIBLE BRONCHOSCOPY;  Surgeon: Leslye Peer, MD;  Location: [facility] ENDOSCOPY;  Service: Pulmonary;;   IABP INSERTION N/A 02/03/2018   Procedure: IABP Insertion;  Surgeon: Swaziland, Peter M, MD;  Location: [facility] INVASIVE CV LAB;  Service: Cardiovascular;  Laterality: N/A;   INTRAOPERATIVE TRANSESOPHAGEAL ECHOCARDIOGRAM N/A 02/03/2018   Procedure: INTRAOPERATIVE TRANSESOPHAGEAL ECHOCARDIOGRAM;  Surgeon: Purcell Nails, MD;  Location: [facility] OR;  Service: Open Heart Surgery;  Laterality: N/A;   LEFT HEART CATH AND CORONARY ANGIOGRAPHY N/A 02/03/2018   Procedure: LEFT HEART CATH AND CORONARY ANGIOGRAPHY;  Surgeon: Swaziland, Peter M, MD;  Location: [facility] INVASIVE CV LAB;  Service: Cardiovascular;  Laterality: N/A;   LEFT HEART CATH AND CORS/GRAFTS ANGIOGRAPHY N/A 02/12/2019   Procedure: LEFT HEART CATH AND CORS/GRAFTS ANGIOGRAPHY;  Surgeon: Iran Ouch, MD;  Location: [facility] INVASIVE CV LAB;  Service: Cardiovascular;  Laterality: N/A;   MEDIASTINOSCOPY N/A 03/11/2014   Procedure: MEDIASTINOSCOPY;  Surgeon: Loreli Slot, MD;  Location: [facility] OR;  Service: Thoracic;  Laterality: N/A;   PLACEMENT OF IMPELLA LEFT VENTRICULAR ASSIST DEVICE  02/03/2018   Procedure: PLACEMENT OF IMPELLA LEFT VENTRICULAR ASSIST DEVICE LD;  Surgeon: Purcell Nails, MD;  Location: [facility] OR;  Service: Open Heart Surgery;;   REMOVAL OF IMPELLA LEFT VENTRICULAR ASSIST DEVICE N/A 02/08/2018   Procedure: REMOVAL OF IMPELLA LEFT VENTRICULAR ASSIST DEVICE;  Surgeon: Purcell Nails, MD;  Location: [facility] OR;  Service: Open Heart Surgery;  Laterality: N/A;   RIGHT HEART CATH N/A 02/03/2018   Procedure: RIGHT HEART CATH;  Surgeon: Swaziland, Peter M, MD;  Location: [facility] INVASIVE CV LAB;  Service: Cardiovascular;   Laterality: N/A;   RIGHT HEART CATH N/A 05/09/2018   Procedure: RIGHT HEART CATH;  Surgeon: Dolores Patty, MD;  Location: [facility] INVASIVE CV LAB;  Service: Cardiovascular;  Laterality: N/A;   TEE WITHOUT CARDIOVERSION N/A 02/08/2018   Procedure: TRANSESOPHAGEAL ECHOCARDIOGRAM (TEE);  Surgeon: Purcell Nails, MD;  Location: [facility] OR;  Service: Open Heart Surgery;  Laterality: N/A;   TUBAL LIGATION     VIDEO BRONCHOSCOPY WITH ENDOBRONCHIAL ULTRASOUND N/A 03/11/2014   Procedure: VIDEO BRONCHOSCOPY WITH ENDOBRONCHIAL ULTRASOUND;  Surgeon: Loreli Slot, MD;  Location: [facility] OR;  Service: Thoracic;  Laterality: N/A;   Current Outpatient Medications  Medication Sig Dispense Refill   albuterol (VENTOLIN HFA) 108 (90 Base) MCG/ACT inhaler Inhale 2 puffs into the lungs every 6 (six) hours as needed for wheezing or shortness of breath. 8.5 g 1   atorvastatin (LIPITOR) 80 MG tablet Take  1 tablet (80 mg total) by mouth daily. 90 tablet 0   cyclobenzaprine (FLEXERIL) 10 MG tablet Take 1 tablet (10 mg total) by mouth 3 (three) times daily as needed for muscle spasms. (Patient taking differently: Take 0.5 mg by mouth daily as needed for muscle spasms.) 60 tablet 3   digoxin (LANOXIN) 0.125 MG tablet TAKE 1 TABLET BY MOUTH DAILY (Patient taking differently: Take 125 mcg by mouth at bedtime.) 90 tablet 3   ELIQUIS 2.5 MG TABS tablet TAKE 1 TABLET BY MOUTH TWICE A DAY 180 tablet 1   FARXIGA 10 MG TABS tablet TAKE 1 TABLET BY MOUTH EVERY DAY 90 tablet 3   Fluticasone-Umeclidin-Vilant (TRELEGY ELLIPTA) 200-62.5-25 MCG/ACT AEPB Inhale 1 puff into the lungs daily. 60 each 0   guaiFENesin-dextromethorphan (ROBITUSSIN DM) 100-10 MG/5ML syrup Take 5 mLs by mouth every 6 (six) hours as needed for cough. 118 mL 0   hydrOXYzine (VISTARIL) 25 MG capsule TAKE 1 CAPSULE (25 MG TOTAL) BY MOUTH DAILY AS NEEDED. 90 capsule 1   ipratropium-albuterol (DUONEB) 0.5-2.5 (3) MG/3ML SOLN Take 3 mLs by nebulization every 4 (four) hours  as needed (for shortness of breath). 90 mL 3   ivabradine (CORLANOR) 7.5 MG TABS tablet TAKE 1 TABLET BY MOUTH TWICE A DAY WITH A MEAL 60 tablet 0   metolazone (ZAROXOLYN) 2.5 MG tablet Take 1 tablet (2.5 mg total) by mouth 2 (two) times a week. Take twice weekly on Tuesday and Thursday with extra 40 mEq of Potassium 10 tablet 6   midodrine (PROAMATINE) 5 MG tablet Take 1 tablet (5 mg total) by mouth 3 (three) times daily with meals. 90 tablet 1   nitroGLYCERIN (NITROSTAT) 0.4 MG SL tablet Place 1 tablet (0.4 mg total) under the tongue every 5 (five) minutes as needed for chest pain. 90 tablet 5   Nutritional Supplements (ENSURE ORIGINAL) LIQD Take 1 Bottle by mouth See admin instructions. Drink 1 bottle 2-3 times daily.     potassium chloride (KLOR-CON) 10 MEQ tablet Take 4 tablets (40 mEq total) by mouth daily. Take extra 4 tablets (40 mEq total) with weekly Metolazone dose     spironolactone (ALDACTONE) 25 MG tablet Take 1 tablet (25 mg total) by mouth at bedtime.     torsemide (DEMADEX) 20 MG tablet Take 4 tablets (80 mg total) by mouth daily. (Patient taking differently: Patient takes 4 tablets by mouth twice a day)     No current facility-administered medications for this encounter.    Allergies:   Lactose intolerance (gi) and Codeine   Social History:  The patient  reports that she quit smoking about 9 years ago. Her smoking use included cigarettes. She started smoking about 29 years ago. She has a 20 pack-year smoking history. She has never used smokeless tobacco. She reports current alcohol use of about 5.0 standard drinks of alcohol per week. She reports current drug use. Drug: Marijuana.   Family History:  The patient's family history includes Cancer in her maternal grandmother; Diabetes in her paternal grandmother; Heart attack in her mother; Heart disease in her mother; Hypertension in her maternal grandmother and mother.   ROS:  Please see the history of present illness.   All other  systems are personally reviewed and negative.    Recent Labs: 08/03/2023: TSH 0.787 12/15/2023: ALT 18 12/16/2023: Hemoglobin 13.7; Platelets 188 12/20/2023: B Natriuretic Peptide 902.0; Magnesium 2.2 12/22/2023: BUN 61; Creatinine, Ser 1.67; Potassium 4.1; Sodium 132  Personally reviewed   Wt Readings from Last  3 Encounters:  01/09/24 41.5 kg (91 lb 9.6 oz)  12/20/23 42.6 kg (93 lb 14.7 oz)  11/21/23 43.6 kg (96 lb 3.2 oz)   BP 100/64   Pulse 88   Wt 41.5 kg (91 lb 9.6 oz)   SpO2 97%   BMI 18.50 kg/m   Physical Exam General:  NAD. No resp difficulty, walked into clinic, cachectic HEENT: Normal Neck: Supple. No JVD. Carotids 2+ bilat; no bruits. No lymphadenopathy or thryomegaly appreciated. Cor: PMI nondisplaced. Regular rate & rhythm. No rubs, gallops or murmurs. Lungs: Diminished throughout Abdomen: Soft, nontender, nondistended. No hepatosplenomegaly. No bruits or masses. Good bowel sounds. Extremities: No cyanosis, clubbing, rash, edema Neuro: Alert & oriented x 3, cranial nerves grossly intact. Moves all 4 extremities w/o difficulty. Affect pleasant.  Device interrogation (personally reviewed): OptiVol down, thoracic impedence near baseline, 0.6 hr/day activity, no VT or AF  ECG (personally reviewed): barostim artifact, appears SR  Assessment & Plan: 1. Chronic Systolic Heart Failure - EF has been 20-25% for the last 5 years.  - S/p Medtronic ICD and Barostim - Echo 5/24: EF 20-25%, RV mildly reduced - Echo 10/24: EF 20-25%, RV mildly reduced - Echo (1/25): EF < 20%, mod to severely reduced RV (in setting of RSV) - NYHA III-IIIb, functional class confounded by deconditioning and COPD. Volume ok today on exam and OptiVol - GDMT limited by need for BP support with midodrine - Continue midodrine 5 mg tid. Plan to wean next visit as able. - Continue metolazone 2.5 mg + extra 40 KCL to 2x/week - Continue torsemide 80 mg bid + 40 KCL daily - Continue Farxiga 10 mg daily. -  Continue digoxin 0.125 mg daily. - Continue ivabradine 7.5 mg bid. - Continue spironolactone 25 mg daily.  - Not a candidate for advanced therapies due to severity of lung disease.  - She is enrolled in monthly iCM - Labs today.  2. Chronic Hypoxic Respiratory Failure/ AECOPD  - Hx frequent admissions for AE COPD + A/C HFrEF.  - No longer on oxygen. - Continue inhalers. - Lungs sound better today.   3. CKD Stage IIIb  - Creatinine baseline ~ 1.4-1.6 - Continue SGLT2i - Labs today.   4. PAF  - Regular on exam today. - Continue Eliquis 2.5 mg bid. Reduced dose (weight/creatinine).   5. CAD - History of CABG x 2 2019 - s/p DES ostial ramus extending to left main (2020) - No chest pain.  - Continue atorvastatin - No ASA with need for AC   6. H/o SCLC - Completed treatment 2015.  - CT chest (5/24) soft tissue thickening along mediastinum but no discrete mass    7. H/o Renal Infarct 2022 - On Eliquis. No bleeding issues.  8. GOC/FTT - She has had a significant functional decline. - Consider GOC conversations - She is DNR/DNI, she confirmed this again today - ? Role for appetite stimulant (has PCP follow up soon, encouraged her to discuss)  Follow up in 6-8 weeks with APP. Repeat echo down the road.  [name], FNP-BC 01/09/24

## 2024-01-09 ENCOUNTER — Telehealth (HOSPITAL_COMMUNITY): Payer: Self-pay

## 2024-01-09 ENCOUNTER — Ambulatory Visit (HOSPITAL_COMMUNITY)
Admission: RE | Admit: 2024-01-09 | Discharge: 2024-01-09 | Disposition: A | Discharge status: Home / Self Care | Payer: BC Managed Care – PPO | Source: Ambulatory Visit | Attending: Family Medicine | Admitting: Family Medicine

## 2024-01-09 ENCOUNTER — Encounter (HOSPITAL_COMMUNITY): Payer: Self-pay

## 2024-01-09 VITALS — BP 100/64 | HR 88 | Wt 91.6 lb

## 2024-01-09 DIAGNOSIS — I480 Paroxysmal atrial fibrillation: Secondary | ICD-10-CM | POA: Diagnosis not present

## 2024-01-09 DIAGNOSIS — Z951 Presence of aortocoronary bypass graft: Secondary | ICD-10-CM | POA: Insufficient documentation

## 2024-01-09 DIAGNOSIS — E1122 Type 2 diabetes mellitus with diabetic chronic kidney disease: Secondary | ICD-10-CM | POA: Diagnosis not present

## 2024-01-09 DIAGNOSIS — J440 Chronic obstructive pulmonary disease with acute lower respiratory infection: Secondary | ICD-10-CM | POA: Diagnosis not present

## 2024-01-09 DIAGNOSIS — Z7901 Long term (current) use of anticoagulants: Secondary | ICD-10-CM | POA: Diagnosis not present

## 2024-01-09 DIAGNOSIS — J9611 Chronic respiratory failure with hypoxia: Secondary | ICD-10-CM | POA: Diagnosis not present

## 2024-01-09 DIAGNOSIS — Z66 Do not resuscitate: Secondary | ICD-10-CM | POA: Diagnosis not present

## 2024-01-09 DIAGNOSIS — Z7189 Other specified counseling: Secondary | ICD-10-CM

## 2024-01-09 DIAGNOSIS — J441 Chronic obstructive pulmonary disease with (acute) exacerbation: Secondary | ICD-10-CM | POA: Diagnosis not present

## 2024-01-09 DIAGNOSIS — I130 Hypertensive heart and chronic kidney disease with heart failure and stage 1 through stage 4 chronic kidney disease, or unspecified chronic kidney disease: Secondary | ICD-10-CM | POA: Insufficient documentation

## 2024-01-09 DIAGNOSIS — Z79899 Other long term (current) drug therapy: Secondary | ICD-10-CM | POA: Insufficient documentation

## 2024-01-09 DIAGNOSIS — R627 Adult failure to thrive: Secondary | ICD-10-CM | POA: Diagnosis not present

## 2024-01-09 DIAGNOSIS — C3490 Malignant neoplasm of unspecified part of unspecified bronchus or lung: Secondary | ICD-10-CM

## 2024-01-09 DIAGNOSIS — N1832 Chronic kidney disease, stage 3b: Secondary | ICD-10-CM | POA: Insufficient documentation

## 2024-01-09 DIAGNOSIS — N280 Ischemia and infarction of kidney: Secondary | ICD-10-CM

## 2024-01-09 DIAGNOSIS — N183 Chronic kidney disease, stage 3 unspecified: Secondary | ICD-10-CM

## 2024-01-09 DIAGNOSIS — I2510 Atherosclerotic heart disease of native coronary artery without angina pectoris: Secondary | ICD-10-CM | POA: Insufficient documentation

## 2024-01-09 DIAGNOSIS — E876 Hypokalemia: Secondary | ICD-10-CM

## 2024-01-09 DIAGNOSIS — I5022 Chronic systolic (congestive) heart failure: Secondary | ICD-10-CM | POA: Diagnosis not present

## 2024-01-09 DIAGNOSIS — Z955 Presence of coronary angioplasty implant and graft: Secondary | ICD-10-CM | POA: Diagnosis not present

## 2024-01-09 LAB — CBC
HCT: 40.5 % (ref 36.0–46.0)
Hemoglobin: 13.2 g/dL (ref 12.0–15.0)
MCH: 28.8 pg (ref 26.0–34.0)
MCHC: 32.6 g/dL (ref 30.0–36.0)
MCV: 88.2 fL (ref 80.0–100.0)
Platelets: 269 10*3/uL (ref 150–400)
RBC: 4.59 MIL/uL (ref 3.87–5.11)
RDW: 16.5 % — ABNORMAL HIGH (ref 11.5–15.5)
WBC: 6.6 10*3/uL (ref 4.0–10.5)
nRBC: 0 % (ref 0.0–0.2)

## 2024-01-09 LAB — BASIC METABOLIC PANEL
Anion gap: 16 — ABNORMAL HIGH (ref 5–15)
BUN: 31 mg/dL — ABNORMAL HIGH (ref 8–23)
CO2: 29 mmol/L (ref 22–32)
Calcium: 9.7 mg/dL (ref 8.9–10.3)
Chloride: 88 mmol/L — ABNORMAL LOW (ref 98–111)
Creatinine, Ser: 1.53 mg/dL — ABNORMAL HIGH (ref 0.44–1.00)
GFR, Estimated: 38 mL/min — ABNORMAL LOW (ref >60)
Glucose, Bld: 164 mg/dL — ABNORMAL HIGH (ref 70–99)
Potassium: 2.8 mmol/L — ABNORMAL LOW (ref 3.5–5.1)
Sodium: 133 mmol/L — ABNORMAL LOW (ref 135–145)

## 2024-01-09 LAB — DIGOXIN LEVEL: Digoxin Level: 0.3 ng/mL — ABNORMAL LOW (ref 0.8–2.0)

## 2024-01-09 MED ORDER — POTASSIUM CHLORIDE ER 10 MEQ PO TBCR: EXTENDED_RELEASE_TABLET | ORAL | 5 refills | Status: DC

## 2024-01-09 MED ORDER — METOLAZONE 2.5 MG PO TABS: 2.5000 mg | ORAL_TABLET | ORAL | 5 refills | Status: DC

## 2024-01-09 NOTE — Addendum Note (Signed)
Encounter addended by: Faythe Casa, CMA on: 01/09/2024 2:31 PM  Actions taken: Order Reconciliation Section accessed

## 2024-01-09 NOTE — Telephone Encounter (Signed)
Patient's kcl medication has been changed an updated in her chart and sent to pt's pharmacy. In addition pt's labs has been placed and her appointment scheduled. Pt aware, agreeable, and verbalized understanding.

## 2024-01-09 NOTE — Patient Instructions (Signed)
No change in medications. Labs today - will call you if abnormal. Return to Heart Failure APP Clinic in 8 weeks - see below. Please call us at [phone] if any questions or concerns prior to your next appointment.

## 2024-01-09 NOTE — Telephone Encounter (Signed)
-----   Message from Jacklynn Ganong sent at 01/09/2024  1:52 PM EST ----- K is too low. Please make sure she is taking EXTRA 40 KCL on metolazone days  Increase KCL to 40 bid. Repeat BMET on Monday

## 2024-01-09 NOTE — Addendum Note (Signed)
Encounter addended by: Faythe Casa, CMA on: 01/09/2024 2:21 PM  Actions taken: Order Reconciliation Section accessed

## 2024-01-10 ENCOUNTER — Ambulatory Visit: Payer: Medicare Other

## 2024-01-14 ENCOUNTER — Telehealth (HOSPITAL_COMMUNITY): Payer: Self-pay | Admitting: Family Medicine

## 2024-01-14 ENCOUNTER — Ambulatory Visit (HOSPITAL_COMMUNITY)
Admission: RE | Admit: 2024-01-14 | Discharge: 2024-01-14 | Disposition: A | Discharge status: Home / Self Care | Payer: BC Managed Care – PPO | Source: Ambulatory Visit | Attending: Internal Medicine | Admitting: Internal Medicine

## 2024-01-14 ENCOUNTER — Ambulatory Visit (INDEPENDENT_AMBULATORY_CARE_PROVIDER_SITE_OTHER): Payer: BC Managed Care – PPO

## 2024-01-14 ENCOUNTER — Other Ambulatory Visit (HOSPITAL_COMMUNITY): Payer: Self-pay

## 2024-01-14 DIAGNOSIS — I5022 Chronic systolic (congestive) heart failure: Secondary | ICD-10-CM | POA: Insufficient documentation

## 2024-01-14 DIAGNOSIS — Z95810 Presence of automatic (implantable) cardiac defibrillator: Secondary | ICD-10-CM

## 2024-01-14 DIAGNOSIS — E876 Hypokalemia: Secondary | ICD-10-CM

## 2024-01-14 LAB — BASIC METABOLIC PANEL
Anion gap: 14 (ref 5–15)
BUN: 18 mg/dL (ref 8–23)
CO2: 25 mmol/L (ref 22–32)
Calcium: 9.1 mg/dL (ref 8.9–10.3)
Chloride: 97 mmol/L — ABNORMAL LOW (ref 98–111)
Creatinine, Ser: 1.2 mg/dL — ABNORMAL HIGH (ref 0.44–1.00)
GFR, Estimated: 51 mL/min — ABNORMAL LOW (ref >60)
Glucose, Bld: 177 mg/dL — ABNORMAL HIGH (ref 70–99)
Potassium: 3.2 mmol/L — ABNORMAL LOW (ref 3.5–5.1)
Sodium: 136 mmol/L (ref 135–145)

## 2024-01-14 MED ORDER — METOLAZONE 2.5 MG PO TABS: 2.5000 mg | ORAL_TABLET | ORAL | 5 refills | Status: DC

## 2024-01-14 MED ORDER — TORSEMIDE 20 MG PO TABS: 80.0000 mg | ORAL_TABLET | Freq: Two times a day (BID) | ORAL | 6 refills | Status: DC

## 2024-01-14 MED ORDER — POTASSIUM CHLORIDE 20 MEQ PO PACK: 40.0000 meq | PACK | Freq: Two times a day (BID) | ORAL | 6 refills | Status: DC

## 2024-01-14 NOTE — Telephone Encounter (Signed)
Pt is here for labs, she is inquiring about an orange powder to help with her potassium. Please advise

## 2024-01-14 NOTE — Progress Notes (Unsigned)
EPIC Encounter for ICM Monitoring  Patient Name: Brittany [name] is a 63 y.o. female Date: 01/14/2024 Primary Care Physican: Georganna Skeans, MD Primary Cardiologist: Bensimhon Electrophysiologist: Lalla Brothers 10/23/2023 Weight: 93-94 lbs 11/13/2023 Weight: 94 lbs  01/03/2024 Weight: 93-94 lbs    Spoke with patient and heart failure questions reviewed.  Transmission results reviewed.  Pt reports SOB and feeling tightness around the middle of chest.    Optivol Thoracic impedance suggesting possible fluid accumulation starting 1/25 and starting to trend back toward baseline.    Prescribed: Torsemide 20 mg take 4 tablets (80 mg total) by mouth daily.  01/14/2024 Pt report taking 80 mg twice a day since hospital discharge on 1/11.  Potassium 10 mEq take 4 tablets (40 mEq total) by mouth twice a day except on Metolazone days, Tues & Thursdays take 4 tablets (40 mEq total) 3 times a day.   Spironolactone 25 mg take 0.5 tablet (12.5 mg total) by mouth at bedtime Metolazone 2.5 mg take 1 tablet by mouth twice a week on Tues & Thurs with extra 40 mEq Potassium with each dosage.  01/03/2024 Pt reports she has not resumed Metolazone since hospital discharge on 1/11   Labs: 01/14/2024 Creatinine 1.20, BUN 18, Potassium 3.2, Sodium 136, GFR 51 01/09/2024 Creatinine 1.53, BUN 31, Potassium 2.8, Sodium 133, GFR 38 12/22/2023 Creatinine 1.67, BUN 61, Potassium 4.1, Sodium 132, GFR 34  12/21/2023 Creatinine 1.53, BUN 61, Potassium 3.6, Sodium 138, GFR 38  12/20/2023 Creatinine 1.60, BUN 70, Potassium 3.2, Sodium 135, GFR 36  12/19/2023 Creatinine 1.99, BUN 79, Potassium 4.7, Sodium 133, GFR 28 12/18/2023 Creatinine 1.86, BUN 63, Potassium 4.1, Sodium 135, GFR 30  12/17/2023 Creatinine 1.74, BUN 59, Potassium 4.2, Sodium 133, GFR 33  A complete set of results can be found in Results Review.   Recommendations:   Sent to [name], NP for review of the following:  Torsemide - she is taking 80 mg twice  a day.  Discrepancy in Epic and listed as 80 mg daily  Metolazone - she has not resumed since 1/11 hospital discharge.  Potassium - 2/3 K+ is lower than normal  She taking LESS than prescribed d/t difficulty swallowing so many pills.  Requesting to CHANGE from pills to Potassium powder.  Need KCL Powder dosage if changing from tablets.     Follow-up plan: ICM clinic phone appointment on 01/21/2024 to recheck fluid levels.   91 day device clinic remote transmission 03/31/2024.     EP/Cardiology Office Visits:  02/20/2024 with HF Clinic.  01/28/2024 with Otilio Saber, PA   Copy of ICM check sent to Dr. Lalla Brothers.  3 month ICM trend: 01/14/2024.    12-14 Month ICM trend:     Karie Soda, RN 01/14/2024 2:52 PM

## 2024-01-15 ENCOUNTER — Ambulatory Visit (INDEPENDENT_AMBULATORY_CARE_PROVIDER_SITE_OTHER): Payer: BC Managed Care – PPO | Admitting: Family Medicine

## 2024-01-15 VITALS — BP 100/64 | HR 90 | Temp 97.8°F | Resp 16 | Wt 91.8 lb

## 2024-01-15 DIAGNOSIS — E876 Hypokalemia: Secondary | ICD-10-CM

## 2024-01-15 DIAGNOSIS — I5022 Chronic systolic (congestive) heart failure: Secondary | ICD-10-CM

## 2024-01-15 DIAGNOSIS — F5000 Anorexia nervosa, unspecified: Secondary | ICD-10-CM

## 2024-01-15 DIAGNOSIS — N184 Chronic kidney disease, stage 4 (severe): Secondary | ICD-10-CM | POA: Diagnosis not present

## 2024-01-15 DIAGNOSIS — F509 Eating disorder, unspecified: Secondary | ICD-10-CM | POA: Diagnosis not present

## 2024-01-15 DIAGNOSIS — J441 Chronic obstructive pulmonary disease with (acute) exacerbation: Secondary | ICD-10-CM

## 2024-01-15 MED ORDER — IPRATROPIUM-ALBUTEROL 0.5-2.5 (3) MG/3ML IN SOLN: 3.0000 mL | RESPIRATORY_TRACT | 0 refills | Status: DC | PRN

## 2024-01-15 MED ORDER — MEGESTROL ACETATE 20 MG PO TABS: 20.0000 mg | ORAL_TABLET | Freq: Two times a day (BID) | ORAL | 1 refills | Status: DC

## 2024-01-15 NOTE — Progress Notes (Signed)
Pt notified of recommendations by Zella Richer, RN at HF clinic.

## 2024-01-15 NOTE — Progress Notes (Signed)
 Patient said she still feel as if she is getting over there RSV, coivd, and pneu Patient is trying to get her strength back

## 2024-01-15 NOTE — Telephone Encounter (Signed)
Addressed with 2/3 lab results

## 2024-01-15 NOTE — Progress Notes (Signed)
  Received: Brittany [name], Brittany Malta, FNP  Brittany [name], Brittany Igo, RN; Brittany Charity, RN Brittany [name], thanks! 1.) Torsemide should be 80 mg bid. 2.) please add back metolazone 2.5 mg + extra 40 KCL once a week (needs a dose tomorrow 01/16/24). 3.) K still low, but sounds like she is unable to swallow the KCL tablets. She needs to be on 40 KCL bid, with extra 40 on metolazone days. Please change potassium tablets to powder, per her request .  I am CC Tiffany, RN in clinic to facilitate these changes. Thanks!

## 2024-01-16 NOTE — Progress Notes (Signed)
 Established Patient Office Visit  Subjective    Patient ID: Brittany [name] , female    DOB: 11-20-1961  Age: 63 y.o. MRN: [id number]  CC: No chief complaint on file.   HPI Brittany [name]  presents for hospital discharge follow up. She had recent admission  with several dx including COPD exacerbation, AKI, and acute CHF. Patient reports much improvement and resolution of sxs.   Outpatient Encounter Medications as of 01/15/2024  Medication Sig   albuterol  (VENTOLIN  HFA) 108 (90 Base) MCG/ACT inhaler Inhale 2 puffs into the lungs every 6 (six) hours as needed for wheezing or shortness of breath.   atorvastatin  (LIPITOR ) 80 MG tablet Take 1 tablet (80 mg total) by mouth daily.   cyclobenzaprine  (FLEXERIL ) 10 MG tablet Take 1 tablet (10 mg total) by mouth 3 (three) times daily as needed for muscle spasms. (Patient taking differently: Take 0.5 mg by mouth daily as needed for muscle spasms.)   digoxin  (LANOXIN ) 0.125 MG tablet TAKE 1 TABLET BY MOUTH DAILY (Patient taking differently: Take 125 mcg by mouth at bedtime.)   ELIQUIS  2.5 MG TABS tablet TAKE 1 TABLET BY MOUTH TWICE A DAY   FARXIGA  10 MG TABS tablet TAKE 1 TABLET BY MOUTH EVERY DAY   Fluticasone -Umeclidin-Vilant (TRELEGY ELLIPTA ) 200-62.5-25 MCG/ACT AEPB Inhale 1 puff into the lungs daily.   guaiFENesin -dextromethorphan  (ROBITUSSIN DM) 100-10 MG/5ML syrup Take 5 mLs by mouth every 6 (six) hours as needed for cough.   hydrOXYzine  (VISTARIL ) 25 MG capsule TAKE 1 CAPSULE (25 MG TOTAL) BY MOUTH DAILY AS NEEDED.   ivabradine  (CORLANOR ) 7.5 MG TABS tablet TAKE 1 TABLET BY MOUTH TWICE A DAY WITH A MEAL   megestrol  (MEGACE ) 20 MG tablet Take 1 tablet (20 mg total) by mouth 2 (two) times daily.   metolazone  (ZAROXOLYN ) 2.5 MG tablet Take 1 tablet (2.5 mg total) by mouth once a week. on Tuesday starting 01/15/2024 with 40 mEq of Potassium 3 times per day on metolazone  days   midodrine  (PROAMATINE ) 5 MG tablet Take 1 tablet (5 mg  total) by mouth 3 (three) times daily with meals.   nitroGLYCERIN  (NITROSTAT ) 0.4 MG SL tablet Place 1 tablet (0.4 mg total) under the tongue every 5 (five) minutes as needed for chest pain.   Nutritional Supplements (ENSURE ORIGINAL) LIQD Take 1 Bottle by mouth See admin instructions. Drink 1 bottle 2-3 times daily.   potassium chloride  (KLOR-CON ) 20 MEQ packet Take 40 mEq by mouth 2 (two) times daily. Take 40 mEq total twice a day except on Metolazone  days of Tuesdays take (40 meq) 3 times per day.   spironolactone  (ALDACTONE ) 25 MG tablet Take 1 tablet (25 mg total) by mouth at bedtime.   torsemide  (DEMADEX ) 20 MG tablet Take 4 tablets (80 mg total) by mouth 2 (two) times daily.   [DISCONTINUED] ipratropium-albuterol  (DUONEB) 0.5-2.5 (3) MG/3ML SOLN Take 3 mLs by nebulization every 4 (four) hours as needed (for shortness of breath).   ipratropium-albuterol  (DUONEB) 0.5-2.5 (3) MG/3ML SOLN Take 3 mLs by nebulization every 4 (four) hours as needed (for shortness of breath).   No facility-administered encounter medications on file as of 01/15/2024.    Past Medical History:  Diagnosis Date   Acute respiratory failure (HCC) 05/05/2018   Acute systolic congestive heart failure (HCC) 02/03/2018   AICD (automatic cardioverter/defibrillator) present    Allergy    Anxiety    Asthma    DM2 (diabetes mellitus, type 2) (HCC) 10/20/2020   Hypertension    PAF (paroxysmal atrial  fibrillation) (HCC)    Presence of permanent cardiac pacemaker    Prophylactic measure 08/03/14-08/19/14   Prophyl. 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   LIMA to LAD, SVG to D1, SVG to OM1, EVH via right thigh with implantation of Impella LD LVAD via direct aortic approach   Small cell lung cancer (HCC) 03/16/2014    Past Surgical History:  Procedure Laterality Date   BRONCHIAL BRUSHINGS  10/25/2020   Procedure: BRONCHIAL BRUSHINGS;  Surgeon: Shelah Lamar RAMAN, MD;  Location: [facility] ENDOSCOPY;  Service:  Pulmonary;;   BRONCHIAL NEEDLE ASPIRATION BIOPSY  10/25/2020   Procedure: BRONCHIAL NEEDLE ASPIRATION BIOPSIES;  Surgeon: Shelah Lamar RAMAN, MD;  Location: [facility] ENDOSCOPY;  Service: Pulmonary;;   CARDIAC DEFIBRILLATOR PLACEMENT  08/15/2018   MDT Visia AF MRI VR ICD implanted by Dr Zulma for primary prevention of sudden   CESAREAN SECTION     CORONARY ARTERY BYPASS GRAFT N/A 02/03/2018   Procedure: CORONARY ARTERY BYPASS GRAFTING (CABG);  Surgeon: Dusty Sudie DEL, MD;  Location: [facility] OR;  Service: Open Heart Surgery;  Laterality: N/A;  Time 3 using left internal mammary artery and endoscopically harvested right saphenous vein   CORONARY BALLOON ANGIOPLASTY N/A 02/03/2018   Procedure: CORONARY BALLOON ANGIOPLASTY;  Surgeon: Jordan, Peter M, MD;  Location: [facility] INVASIVE CV LAB;  Service: Cardiovascular;  Laterality: N/A;   CORONARY STENT INTERVENTION N/A 02/12/2019   Procedure: CORONARY STENT INTERVENTION;  Surgeon: Darron Deatrice LABOR, MD;  Location: [facility] INVASIVE CV LAB;  Service: Cardiovascular;  Laterality: N/A;   CORONARY/GRAFT ACUTE MI REVASCULARIZATION N/A 02/03/2018   Procedure: Coronary/Graft Acute MI Revascularization;  Surgeon: Jordan, Peter M, MD;  Location: [facility] INVASIVE CV LAB;  Service: Cardiovascular;  Laterality: N/A;   ENDOBRONCHIAL ULTRASOUND N/A 10/25/2020   Procedure: ENDOBRONCHIAL ULTRASOUND;  Surgeon: Shelah Lamar RAMAN, MD;  Location: [facility] ENDOSCOPY;  Service: Pulmonary;  Laterality: N/A;   FLEXIBLE BRONCHOSCOPY  10/25/2020   Procedure: FLEXIBLE BRONCHOSCOPY;  Surgeon: Shelah Lamar RAMAN, MD;  Location: [facility] ENDOSCOPY;  Service: Pulmonary;;   IABP INSERTION N/A 02/03/2018   Procedure: IABP Insertion;  Surgeon: Jordan, Peter M, MD;  Location: [facility] INVASIVE CV LAB;  Service: Cardiovascular;  Laterality: N/A;   INTRAOPERATIVE TRANSESOPHAGEAL ECHOCARDIOGRAM N/A 02/03/2018   Procedure: INTRAOPERATIVE TRANSESOPHAGEAL ECHOCARDIOGRAM;  Surgeon: Dusty Sudie DEL, MD;  Location: [facility] OR;  Service: Open Heart  Surgery;  Laterality: N/A;   LEFT HEART CATH AND CORONARY ANGIOGRAPHY N/A 02/03/2018   Procedure: LEFT HEART CATH AND CORONARY ANGIOGRAPHY;  Surgeon: Jordan, Peter M, MD;  Location: [facility] INVASIVE CV LAB;  Service: Cardiovascular;  Laterality: N/A;   LEFT HEART CATH AND CORS/GRAFTS ANGIOGRAPHY N/A 02/12/2019   Procedure: LEFT HEART CATH AND CORS/GRAFTS ANGIOGRAPHY;  Surgeon: Darron Deatrice LABOR, MD;  Location: [facility] INVASIVE CV LAB;  Service: Cardiovascular;  Laterality: N/A;   MEDIASTINOSCOPY N/A 03/11/2014   Procedure: MEDIASTINOSCOPY;  Surgeon: Elspeth JAYSON Millers, MD;  Location: [facility] OR;  Service: Thoracic;  Laterality: N/A;   PLACEMENT OF IMPELLA LEFT VENTRICULAR ASSIST DEVICE  02/03/2018   Procedure: PLACEMENT OF IMPELLA LEFT VENTRICULAR ASSIST DEVICE LD;  Surgeon: Dusty Sudie DEL, MD;  Location: [facility] OR;  Service: Open Heart Surgery;;   REMOVAL OF IMPELLA LEFT VENTRICULAR ASSIST DEVICE N/A 02/08/2018   Procedure: REMOVAL OF IMPELLA LEFT VENTRICULAR ASSIST DEVICE;  Surgeon: Dusty Sudie DEL, MD;  Location: [facility] OR;  Service: Open Heart Surgery;  Laterality: N/A;   RIGHT HEART CATH N/A 02/03/2018   Procedure: RIGHT HEART CATH;  Surgeon: Jordan, Peter M, MD;  Location: [facility] INVASIVE CV LAB;  Service: Cardiovascular;  Laterality: N/A;   RIGHT HEART CATH N/A 05/09/2018   Procedure: RIGHT HEART CATH;  Surgeon: Cherrie Toribio SAUNDERS, MD;  Location: [facility] INVASIVE CV LAB;  Service: Cardiovascular;  Laterality: N/A;   TEE WITHOUT CARDIOVERSION N/A 02/08/2018   Procedure: TRANSESOPHAGEAL ECHOCARDIOGRAM (TEE);  Surgeon: Dusty Sudie DEL, MD;  Location: [facility] OR;  Service: Open Heart Surgery;  Laterality: N/A;   TUBAL LIGATION     VIDEO BRONCHOSCOPY WITH ENDOBRONCHIAL ULTRASOUND N/A 03/11/2014   Procedure: VIDEO BRONCHOSCOPY WITH ENDOBRONCHIAL ULTRASOUND;  Surgeon: Elspeth JAYSON Millers, MD;  Location: [facility] OR;  Service: Thoracic;  Laterality: N/A;    Family History  Problem Relation Age of Onset   Heart disease Mother    Hypertension  Mother    Heart attack Mother    Hypertension Maternal Grandmother    Cancer Maternal Grandmother    Diabetes Paternal Grandmother    Stroke Neg Hx     Social History   Socioeconomic History   Marital status: Married    Spouse name: Keven [place]     Number of children: 1   Years of education: Not on file   Highest education level: Not on file  Occupational History   Occupation: disabled  Tobacco Use   Smoking status: Former    Current packs/day: 0.00    Average packs/day: 1 pack/day for 20.0 years (20.0 ttl pk-yrs)    Types: Cigarettes    Start date: 03/13/1994    Quit date: 03/13/2014    Years since quitting: 9.8   Smokeless tobacco: Never  Vaping Use   Vaping status: Never Used  Substance and Sexual Activity   Alcohol use: Yes    Alcohol/week: 5.0 standard drinks of alcohol    Types: 5 Glasses of wine per week    Comment: a few glasses of wine weekly   Drug use: Yes    Types: Marijuana    Comment: medicinal, not daily   Sexual activity: Never  Other Topics Concern   Not on file  Social History Narrative   Not on file   Social Drivers of Health   Financial Resource Strain: Medium Risk (02/11/2021)   Overall Financial Resource Strain (CARDIA)    Difficulty of Paying Living Expenses: Somewhat hard  Food Insecurity: No Food Insecurity (12/16/2023)   Hunger Vital Sign    Worried About Running Out of Food in the Last Year: Never true    Ran Out of Food in the Last Year: Never true  Transportation Needs: Unmet Transportation Needs (12/22/2023)   PRAPARE - Transportation    Lack of Transportation (Medical): Yes    Lack of Transportation (Non-Medical): Yes  Physical Activity: Insufficiently Active (02/11/2021)   Exercise Vital Sign    Days of Exercise per Week: 2 days    Minutes of Exercise per Session: 10 min  Stress: Stress Concern Present (02/11/2021)   [employer] of Occupational Health - Occupational Stress Questionnaire    Feeling of Stress : To some extent   Social Connections: Unknown (02/11/2021)   Social Connection and Isolation Panel [NHANES]    Frequency of Communication with Friends and Family: More than three times a week    Frequency of Social Gatherings with Friends and Family: Twice a week    Attends Religious Services: Not on Marketing Executive or Organizations: Not on file    Attends Banker Meetings: Never  Marital Status: Married  Catering Manager Violence: Not At Risk (12/16/2023)   Humiliation, Afraid, Rape, and Kick questionnaire    Fear of Current or Ex-Partner: No    Emotionally Abused: No    Physically Abused: No    Sexually Abused: No    Review of Systems  All other systems reviewed and are negative.       Objective    BP 100/64   Pulse 90   Temp 97.8 F (36.6 C) (Temporal)   Resp 16   Wt 91 lb 12.8 oz (41.6 kg)   SpO2 96%   BMI 18.54 kg/m   Physical Exam Vitals and nursing note reviewed.  Constitutional:      General: She is not in acute distress. Cardiovascular:     Rate and Rhythm: Normal rate and regular rhythm.  Pulmonary:     Effort: Pulmonary effort is normal. No respiratory distress.     Breath sounds: Rhonchi present.  Abdominal:     Palpations: Abdomen is soft.     Tenderness: There is no abdominal tenderness.  Neurological:     General: No focal deficit present.     Mental Status: She is alert and oriented to person, place, and time.         Assessment & Plan:  1. Chronic kidney disease (CKD), stage IV (severe) (HCC) (Primary) Monitoring labs ordered.  - CBC with Differential - CMP14+EGFR  2. Chronic systolic congestive heart failure (HCC) Appears stable. Continue   3. Hypokalemia Monitoring labs ordered - CBC with Differential - CMP14+EGFR  4. Eating disorder, unspecified type Megace  prescribed. Nutritional supplements recommended daily.  5. COPD exacerbation (HCC) Improved. Duoneb refilled and patient to follow up with pulmonary for  further eval/mgt.   No follow-ups on file.   Tanda Raguel SQUIBB, MD

## 2024-01-17 ENCOUNTER — Encounter: Payer: Self-pay | Admitting: Family Medicine

## 2024-01-18 ENCOUNTER — Other Ambulatory Visit: Payer: Self-pay | Admitting: Internal Medicine

## 2024-01-21 ENCOUNTER — Ambulatory Visit: Payer: BC Managed Care – PPO | Attending: Cardiology

## 2024-01-21 DIAGNOSIS — Z95810 Presence of automatic (implantable) cardiac defibrillator: Secondary | ICD-10-CM

## 2024-01-21 DIAGNOSIS — I5022 Chronic systolic (congestive) heart failure: Secondary | ICD-10-CM

## 2024-01-22 NOTE — Progress Notes (Signed)
EPIC Encounter for ICM Monitoring  Patient Name: Brittany [name] is a 63 y.o. female Date: 01/22/2024 Primary Care Physican: Georganna Skeans, MD Primary Cardiologist: Bensimhon Electrophysiologist: Lalla Brothers 10/23/2023 Weight: 93-94 lbs 11/13/2023 Weight: 94 lbs  01/03/2024 Weight: 93-94 lbs  01/22/2024 Weight: 91 lbs (lowest weight 84 lbs)   Spoke with patient and heart failure questions reviewed.  Transmission results reviewed.  Pt reports feeling so much better since taking powder potassium.  She is walking better.   PCP prescribed Megace to help her gain weight which is working.    Optivol Thoracic impedance suggesting fluid levels improving but trending close to baseline.    Prescribed: Torsemide 20 mg take 4 tablets (80 mg total) by mouth two times a day.  Potassium 20 mEq packet take 40 mEq total by mouth 2 (two) times daily except on Metolazone days of Tuesdays take 40 mEq total 3 times a day.   Spironolactone 25 mg take 1 tablet (25 mg total) by mouth at bedtime Metolazone 2.5 mg take 1 tablet by mouth once a week on Tuesdays with 40 mEq Potassium 3 times a day on Metolazone days   Labs: 01/25/2024 BMET scheduled at HF clinic  01/14/2024 Creatinine 1.20, BUN 18, Potassium 3.2, Sodium 136, GFR 51 01/09/2024 Creatinine 1.53, BUN 31, Potassium 2.8, Sodium 133, GFR 38 12/22/2023 Creatinine 1.67, BUN 61, Potassium 4.1, Sodium 132, GFR 34  12/21/2023 Creatinine 1.53, BUN 61, Potassium 3.6, Sodium 138, GFR 38  12/20/2023 Creatinine 1.60, BUN 70, Potassium 3.2, Sodium 135, GFR 36  12/19/2023 Creatinine 1.99, BUN 79, Potassium 4.7, Sodium 133, GFR 28 12/18/2023 Creatinine 1.86, BUN 63, Potassium 4.1, Sodium 135, GFR 30  12/17/2023 Creatinine 1.74, BUN 59, Potassium 4.2, Sodium 133, GFR 33  A complete set of results can be found in Results Review.   Recommendations:   She is scheduled to take Metolazone today and asked to have lab appt changed from 2/12 to 2/14.  She is unable to  get to the lab tomorrow, 2/12.     Follow-up plan: ICM clinic phone appointment on 01/25/2024 to recheck fluid levels.   91 day device clinic remote transmission 03/31/2024.     EP/Cardiology Office Visits:  02/20/2024 with HF Clinic.  01/28/2024 with Otilio Saber, PA   Copy of ICM check sent to Dr. Lalla Brothers.  3 month ICM trend: 01/21/2024.    12-14 Month ICM trend:     Karie Soda, RN 01/22/2024 9:54 AM

## 2024-01-23 ENCOUNTER — Other Ambulatory Visit (HOSPITAL_COMMUNITY): Payer: Medicare Other

## 2024-01-25 ENCOUNTER — Ambulatory Visit: Payer: Medicare Other | Admitting: Student

## 2024-01-25 ENCOUNTER — Ambulatory Visit: Payer: Medicare Other

## 2024-01-25 ENCOUNTER — Ambulatory Visit
Admission: RE | Admit: 2024-01-25 | Discharge: 2024-01-25 | Disposition: A | Discharge status: Home / Self Care | Payer: BC Managed Care – PPO | Source: Ambulatory Visit | Attending: Cardiology | Admitting: Cardiology

## 2024-01-25 DIAGNOSIS — I5022 Chronic systolic (congestive) heart failure: Secondary | ICD-10-CM

## 2024-01-25 DIAGNOSIS — Z95810 Presence of automatic (implantable) cardiac defibrillator: Secondary | ICD-10-CM

## 2024-01-25 LAB — BASIC METABOLIC PANEL
Anion gap: 14 (ref 5–15)
BUN: 48 mg/dL — ABNORMAL HIGH (ref 8–23)
CO2: 24 mmol/L (ref 22–32)
Calcium: 10 mg/dL (ref 8.9–10.3)
Chloride: 99 mmol/L (ref 98–111)
Creatinine, Ser: 1.53 mg/dL — ABNORMAL HIGH (ref 0.44–1.00)
GFR, Estimated: 38 mL/min — ABNORMAL LOW (ref >60)
Glucose, Bld: 196 mg/dL — ABNORMAL HIGH (ref 70–99)
Potassium: 4.5 mmol/L (ref 3.5–5.1)
Sodium: 137 mmol/L (ref 135–145)

## 2024-01-25 NOTE — Progress Notes (Addendum)
  Cardiology Office Note:   Date:  01/28/2024  ID:  Brittany [name], DOB December 25, 1960, MRN [id number]  Primary Cardiologist: None Electrophysiologist: Lanier Prude, MD   History of Present Illness:   Brittany [name] is a 63 y.o. female with h/o chronic systolic CHF, ICM, CAD s/p CABG, COPD, and PAF seen today for routine electrophysiology followup.   Had recent admission. Admitted 1/25 with AECOPD & a/c HF. RSV +. Required BiPap. Echo showed EF < 20%, G2DD, RV moderately to severely reduced. She remained DNR.   Since discharge from the hospital the patient reports doing very well. Breathing has been stable. Denies palpitations, CP, dizziness, or edema. No PND or orthopnea. Appetite is OK. Drinking Ensure. HAs had continued weight loss. Taking all medications as directed and had recent HF clinic follow up.   Review of systems complete and found to be negative unless listed in HPI.    EP Information / Studies Reviewed:    EKG is not ordered today. EKG from 01/09/2024 reviewed which showed NSR at 83 bpm with barostim interference.  Arrhythmia/Device History Barostim (standard) implanted 04/13/2022 for Chronic systolic CHF Medtronic Single Chamber ICD implanted 2019 for Chronic systolic CHF.  HF MD--Dr Bensimhon    Barostim Interrogation- Performed personally and reviewed in detail today,  See scanned report  ICD/PPM interrogation - Performed personally and reviewed in detail today.   Echo 12/15/2023 LVEF<20%, Grade 2 DD, Mod/Sev RV dysfunction, mild MR  Physical Exam:   VS:  BP 98/60 (BP Location: Right Arm, Patient Position: Sitting, Cuff Size: Normal)   Pulse (!) 105   Resp 16   Ht 4\' 11"  (1.499 m)   Wt 94 lb 6.4 oz (42.8 kg)   SpO2 95%   BMI 19.07 kg/m    Wt Readings from Last 3 Encounters:  01/28/24 94 lb 6.4 oz (42.8 kg)  01/15/24 91 lb 12.8 oz (41.6 kg)  01/09/24 91 lb 9.6 oz (41.5 kg)     GEN: Well nourished, well developed in no acute distress NECK:  No JVD; No carotid bruits CARDIAC: Regular rate and rhythm, no murmurs, rubs, gallops RESPIRATORY:  Clear to auscultation without rales, wheezing or rhonchi  ABDOMEN: Soft, non-tender, non-distended EXTREMITIES:  No edema; No deformity   ASSESSMENT AND PLAN:    Chronic systolic CHF s/p Medtronic and Barostim implantation NYHA III symptoms.   Device programmed to 8.59ma chronically.  Device impedence stable. Pt goals are to continue to increase functional status as able. She is frustrated with weight loss and recent admission for RSV/PNA.  Normal device function See scanned report. Corrected HV vectors per industry recommendation.   CAD s/p CABG Denies s/s ischemia  COPD Not wearing oxygen daily  PAF Regular on exam today.  Not on OAC due to recurrent hemoptysis  Disposition:   Follow up with EP APP in in 6 months  Signed, Graciella Freer, PA-C

## 2024-01-25 NOTE — Progress Notes (Signed)
EPIC Encounter for ICM Monitoring  Patient Name: Brittany [name] is a 63 y.o. female Date: 01/25/2024 Primary Care Physican: Georganna Skeans, MD Primary Cardiologist: Bensimhon Electrophysiologist: Lalla Brothers 10/23/2023 Weight: 93-94 lbs 11/13/2023 Weight: 94 lbs  01/03/2024 Weight: 93-94 lbs  01/22/2024 Weight: 91 lbs (lowest weight 84 lbs)   Spoke with patient and heart failure questions reviewed.  Transmission results reviewed.  Pt asymptomatic for fluid accumulation.  Reports feeling well at this time and voices no complaints.      Optivol Thoracic impedance suggesting fluid levels improving after taking weekly Tuesday Metolazone but has not complete returned to baseline.    Prescribed: Torsemide 20 mg take 4 tablets (80 mg total) by mouth two times a day.  Potassium 20 mEq packet take 40 mEq total by mouth 2 (two) times daily except on Metolazone days of Tuesdays take 40 mEq total 3 times a day.   Spironolactone 25 mg take 1 tablet (25 mg total) by mouth at bedtime Metolazone 2.5 mg take 1 tablet by mouth once a week on Tuesdays with 40 mEq Potassium 3 times a day on Metolazone days   Labs: 01/25/2024 Creatinine 1.53, BUN 48 ,Potassium 4.5, Sodium 137, GFR 38 01/14/2024 Creatinine 1.20, BUN 18, Potassium 3.2, Sodium 136, GFR 51 01/09/2024 Creatinine 1.53, BUN 31, Potassium 2.8, Sodium 133, GFR 38 12/22/2023 Creatinine 1.67, BUN 61, Potassium 4.1, Sodium 132, GFR 34  12/21/2023 Creatinine 1.53, BUN 61, Potassium 3.6, Sodium 138, GFR 38  12/20/2023 Creatinine 1.60, BUN 70, Potassium 3.2, Sodium 135, GFR 36  12/19/2023 Creatinine 1.99, BUN 79, Potassium 4.7, Sodium 133, GFR 28 12/18/2023 Creatinine 1.86, BUN 63, Potassium 4.1, Sodium 135, GFR 30  12/17/2023 Creatinine 1.74, BUN 59, Potassium 4.2, Sodium 133, GFR 33  A complete set of results can be found in Results Review.   Recommendations:   Pt will be seen in office on 2/17 by Otilio Saber, PA.    Follow-up plan: ICM clinic  phone appointment on 02/06/2024 to recheck fluid levels (Office defib check for 2/17).   91 day device clinic remote transmission 03/31/2024.     EP/Cardiology Office Visits:  02/20/2024 with HF Clinic.  01/28/2024 with Otilio Saber, PA   Copy of ICM check sent to Dr. Lalla Brothers.  3 month ICM trend: 01/25/2024.    12-14 Month ICM trend:     Karie Soda, RN 01/25/2024 7:24 AM

## 2024-01-28 ENCOUNTER — Ambulatory Visit: Payer: BC Managed Care – PPO | Attending: Student | Admitting: Student

## 2024-01-28 ENCOUNTER — Encounter: Payer: Self-pay | Admitting: Student

## 2024-01-28 VITALS — BP 98/60 | HR 105 | Resp 16 | Ht 59.0 in | Wt 94.4 lb

## 2024-01-28 DIAGNOSIS — I2510 Atherosclerotic heart disease of native coronary artery without angina pectoris: Secondary | ICD-10-CM

## 2024-01-28 DIAGNOSIS — I480 Paroxysmal atrial fibrillation: Secondary | ICD-10-CM | POA: Diagnosis not present

## 2024-01-28 DIAGNOSIS — J9611 Chronic respiratory failure with hypoxia: Secondary | ICD-10-CM | POA: Diagnosis not present

## 2024-01-28 DIAGNOSIS — I5022 Chronic systolic (congestive) heart failure: Secondary | ICD-10-CM

## 2024-01-28 LAB — CUP PACEART INCLINIC DEVICE CHECK
Battery Remaining Longevity: 80 mo
Battery Voltage: 3.01 V
Brady Statistic RV Percent Paced: 0.01 %
Date Time Interrogation Session: 20250217125500
HighPow Impedance: 60 Ohm
Implantable Lead Connection Status: 753985
Implantable Lead Implant Date: 20190905
Implantable Lead Location: 753860
Implantable Pulse Generator Implant Date: 20190905
Lead Channel Impedance Value: 342 Ohm
Lead Channel Impedance Value: 456 Ohm
Lead Channel Pacing Threshold Amplitude: 0.625 V
Lead Channel Pacing Threshold Pulse Width: 0.4 ms
Lead Channel Sensing Intrinsic Amplitude: 23.25 mV
Lead Channel Sensing Intrinsic Amplitude: 23.5 mV
Lead Channel Setting Pacing Amplitude: 2 V
Lead Channel Setting Pacing Pulse Width: 0.4 ms
Lead Channel Setting Sensing Sensitivity: 0.3 mV
Zone Setting Status: 755011
Zone Setting Status: 755011

## 2024-01-28 NOTE — Patient Instructions (Signed)
 Medication Instructions:  Your physician recommends that you continue on your current medications as directed. Please refer to the Current Medication list given to you today.  *If you need a refill on your cardiac medications before your next appointment, please call your pharmacy*  Lab Work: None ordered If you have labs (blood work) drawn today and your tests are completely normal, you will receive your results only by: MyChart Message (if you have MyChart) OR A paper copy in the mail If you have any lab test that is abnormal or we need to change your treatment, we will call you to review the results.  Follow-Up: At [facility], you and your health needs are our priority.  As part of our continuing mission to provide you with exceptional heart care, we have created designated Provider Care Teams.  These Care Teams include your primary Cardiologist (physician) and Advanced Practice Providers (APPs -  Physician Assistants and Nurse Practitioners) who all work together to provide you with the care you need, when you need it.  Your next appointment:   6 month(s)  Provider:   Casimiro Needle "Otilio Saber, PA-C

## 2024-01-30 ENCOUNTER — Encounter: Payer: Self-pay | Admitting: Cardiology

## 2024-02-06 ENCOUNTER — Other Ambulatory Visit: Payer: Self-pay | Admitting: Family Medicine

## 2024-02-06 NOTE — Telephone Encounter (Signed)
 Requested medication (s) are due for refill today: Yes  Requested medication (s) are on the active medication list: Yes  Last refill:  01/15/24  Future visit scheduled: Yes  Notes to clinic:  Unable to refill per protocol, cannot delegate.      Requested Prescriptions  Pending Prescriptions Disp Refills   megestrol (MEGACE) 20 MG tablet [Pharmacy Med Name: MEGESTROL 20 MG TABLET] 180 tablet 1    Sig: TAKE 1 TABLET BY MOUTH TWICE A DAY     Not Delegated - OB/GYN:  Progestins - megestrol acetate Failed - 02/06/2024 12:50 PM      Failed - This refill cannot be delegated      Passed - Last BP in normal range    BP Readings from Last 1 Encounters:  01/28/24 98/60         Passed - Valid encounter within last 12 months    Recent Outpatient Visits           3 weeks ago Chronic kidney disease (CKD), stage IV (severe) (HCC)   [facility], MD   3 months ago Essential hypertension    [facility] at [facility], MD   8 months ago Encounter to establish care   [facility], [place], NP   8 years ago Contusion of right shoulder, initial encounter   [facility] at [facility], Gwenlyn Found, MD   9 years ago Cough   [facility] at [facility], Tessa Lerner, MD

## 2024-02-07 NOTE — Progress Notes (Signed)
 Remote ICD transmission.

## 2024-02-08 NOTE — Progress Notes (Signed)
 No ICM remote transmission received for 02/04/2024 and next ICM transmission scheduled for 02/18/2024.

## 2024-02-14 ENCOUNTER — Emergency Department (HOSPITAL_COMMUNITY)

## 2024-02-14 ENCOUNTER — Inpatient Hospital Stay (HOSPITAL_COMMUNITY)
Admission: EM | Admit: 2024-02-14 | Discharge: 2024-02-19 | DRG: 291 | Disposition: A | Discharge status: Home / Self Care | Attending: Internal Medicine | Admitting: Internal Medicine

## 2024-02-14 ENCOUNTER — Encounter (HOSPITAL_COMMUNITY): Payer: Self-pay | Admitting: Emergency Medicine

## 2024-02-14 DIAGNOSIS — I509 Heart failure, unspecified: Secondary | ICD-10-CM | POA: Diagnosis not present

## 2024-02-14 DIAGNOSIS — J9621 Acute and chronic respiratory failure with hypoxia: Secondary | ICD-10-CM | POA: Diagnosis present

## 2024-02-14 DIAGNOSIS — E119 Type 2 diabetes mellitus without complications: Secondary | ICD-10-CM | POA: Diagnosis present

## 2024-02-14 DIAGNOSIS — I10 Essential (primary) hypertension: Secondary | ICD-10-CM | POA: Diagnosis present

## 2024-02-14 DIAGNOSIS — I2510 Atherosclerotic heart disease of native coronary artery without angina pectoris: Secondary | ICD-10-CM | POA: Diagnosis present

## 2024-02-14 DIAGNOSIS — Z923 Personal history of irradiation: Secondary | ICD-10-CM

## 2024-02-14 DIAGNOSIS — N179 Acute kidney failure, unspecified: Secondary | ICD-10-CM | POA: Diagnosis present

## 2024-02-14 DIAGNOSIS — Z955 Presence of coronary angioplasty implant and graft: Secondary | ICD-10-CM

## 2024-02-14 DIAGNOSIS — E43 Unspecified severe protein-calorie malnutrition: Secondary | ICD-10-CM | POA: Diagnosis present

## 2024-02-14 DIAGNOSIS — D631 Anemia in chronic kidney disease: Secondary | ICD-10-CM | POA: Diagnosis present

## 2024-02-14 DIAGNOSIS — Z885 Allergy status to narcotic agent status: Secondary | ICD-10-CM

## 2024-02-14 DIAGNOSIS — N1832 Chronic kidney disease, stage 3b: Secondary | ICD-10-CM | POA: Diagnosis present

## 2024-02-14 DIAGNOSIS — I5023 Acute on chronic systolic (congestive) heart failure: Secondary | ICD-10-CM | POA: Diagnosis present

## 2024-02-14 DIAGNOSIS — I130 Hypertensive heart and chronic kidney disease with heart failure and stage 1 through stage 4 chronic kidney disease, or unspecified chronic kidney disease: Secondary | ICD-10-CM | POA: Diagnosis not present

## 2024-02-14 DIAGNOSIS — J449 Chronic obstructive pulmonary disease, unspecified: Secondary | ICD-10-CM | POA: Diagnosis present

## 2024-02-14 DIAGNOSIS — Z7984 Long term (current) use of oral hypoglycemic drugs: Secondary | ICD-10-CM

## 2024-02-14 DIAGNOSIS — R54 Age-related physical debility: Secondary | ICD-10-CM | POA: Diagnosis present

## 2024-02-14 DIAGNOSIS — J440 Chronic obstructive pulmonary disease with acute lower respiratory infection: Secondary | ICD-10-CM | POA: Diagnosis present

## 2024-02-14 DIAGNOSIS — E785 Hyperlipidemia, unspecified: Secondary | ICD-10-CM | POA: Diagnosis present

## 2024-02-14 DIAGNOSIS — N183 Chronic kidney disease, stage 3 unspecified: Secondary | ICD-10-CM | POA: Diagnosis present

## 2024-02-14 DIAGNOSIS — Z66 Do not resuscitate: Secondary | ICD-10-CM | POA: Diagnosis present

## 2024-02-14 DIAGNOSIS — Z9221 Personal history of antineoplastic chemotherapy: Secondary | ICD-10-CM

## 2024-02-14 DIAGNOSIS — Z95810 Presence of automatic (implantable) cardiac defibrillator: Secondary | ICD-10-CM

## 2024-02-14 DIAGNOSIS — Z7901 Long term (current) use of anticoagulants: Secondary | ICD-10-CM

## 2024-02-14 DIAGNOSIS — Z951 Presence of aortocoronary bypass graft: Secondary | ICD-10-CM

## 2024-02-14 DIAGNOSIS — T380X5A Adverse effect of glucocorticoids and synthetic analogues, initial encounter: Secondary | ICD-10-CM | POA: Diagnosis present

## 2024-02-14 DIAGNOSIS — J441 Chronic obstructive pulmonary disease with (acute) exacerbation: Principal | ICD-10-CM | POA: Diagnosis present

## 2024-02-14 DIAGNOSIS — Z6820 Body mass index (BMI) 20.0-20.9, adult: Secondary | ICD-10-CM

## 2024-02-14 DIAGNOSIS — Z5982 Transportation insecurity: Secondary | ICD-10-CM

## 2024-02-14 DIAGNOSIS — Z79899 Other long term (current) drug therapy: Secondary | ICD-10-CM

## 2024-02-14 DIAGNOSIS — Z87891 Personal history of nicotine dependence: Secondary | ICD-10-CM

## 2024-02-14 DIAGNOSIS — Z5986 Financial insecurity: Secondary | ICD-10-CM

## 2024-02-14 DIAGNOSIS — G4733 Obstructive sleep apnea (adult) (pediatric): Secondary | ICD-10-CM | POA: Diagnosis present

## 2024-02-14 DIAGNOSIS — I252 Old myocardial infarction: Secondary | ICD-10-CM

## 2024-02-14 DIAGNOSIS — Z8249 Family history of ischemic heart disease and other diseases of the circulatory system: Secondary | ICD-10-CM

## 2024-02-14 DIAGNOSIS — E1169 Type 2 diabetes mellitus with other specified complication: Secondary | ICD-10-CM | POA: Diagnosis present

## 2024-02-14 DIAGNOSIS — E1122 Type 2 diabetes mellitus with diabetic chronic kidney disease: Secondary | ICD-10-CM | POA: Diagnosis present

## 2024-02-14 DIAGNOSIS — Z1152 Encounter for screening for COVID-19: Secondary | ICD-10-CM

## 2024-02-14 DIAGNOSIS — Z833 Family history of diabetes mellitus: Secondary | ICD-10-CM

## 2024-02-14 DIAGNOSIS — F419 Anxiety disorder, unspecified: Secondary | ICD-10-CM | POA: Diagnosis present

## 2024-02-14 DIAGNOSIS — Z85118 Personal history of other malignant neoplasm of bronchus and lung: Secondary | ICD-10-CM

## 2024-02-14 DIAGNOSIS — E739 Lactose intolerance, unspecified: Secondary | ICD-10-CM | POA: Diagnosis present

## 2024-02-14 DIAGNOSIS — R7989 Other specified abnormal findings of blood chemistry: Secondary | ICD-10-CM | POA: Diagnosis present

## 2024-02-14 DIAGNOSIS — I5082 Biventricular heart failure: Secondary | ICD-10-CM | POA: Diagnosis present

## 2024-02-14 DIAGNOSIS — E1165 Type 2 diabetes mellitus with hyperglycemia: Secondary | ICD-10-CM | POA: Diagnosis present

## 2024-02-14 DIAGNOSIS — I480 Paroxysmal atrial fibrillation: Secondary | ICD-10-CM | POA: Diagnosis present

## 2024-02-14 LAB — BASIC METABOLIC PANEL
Anion gap: 18 — ABNORMAL HIGH (ref 5–15)
BUN: 27 mg/dL — ABNORMAL HIGH (ref 8–23)
CO2: 20 mmol/L — ABNORMAL LOW (ref 22–32)
Calcium: 9.7 mg/dL (ref 8.9–10.3)
Chloride: 104 mmol/L (ref 98–111)
Creatinine, Ser: 1.2 mg/dL — ABNORMAL HIGH (ref 0.44–1.00)
GFR, Estimated: 51 mL/min — ABNORMAL LOW (ref >60)
Glucose, Bld: 264 mg/dL — ABNORMAL HIGH (ref 70–99)
Potassium: 3.6 mmol/L (ref 3.5–5.1)
Sodium: 142 mmol/L (ref 135–145)

## 2024-02-14 LAB — I-STAT VENOUS BLOOD GAS, ED
Acid-Base Excess: 0 mmol/L (ref 0.0–2.0)
Bicarbonate: 25.4 mmol/L (ref 20.0–28.0)
Calcium, Ion: 1.07 mmol/L — ABNORMAL LOW (ref 1.15–1.40)
HCT: 39 % (ref 36.0–46.0)
Hemoglobin: 13.3 g/dL (ref 12.0–15.0)
O2 Saturation: 89 %
Potassium: 5.6 mmol/L — ABNORMAL HIGH (ref 3.5–5.1)
Sodium: 137 mmol/L (ref 135–145)
TCO2: 27 mmol/L (ref 22–32)
pCO2, Ven: 45.3 mmHg (ref 44–60)
pH, Ven: 7.357 (ref 7.25–7.43)
pO2, Ven: 59 mmHg — ABNORMAL HIGH (ref 32–45)

## 2024-02-14 LAB — CBC WITH DIFFERENTIAL/PLATELET
Abs Immature Granulocytes: 0.03 10*3/uL (ref 0.00–0.07)
Basophils Absolute: 0 10*3/uL (ref 0.0–0.1)
Basophils Relative: 0 %
Eosinophils Absolute: 0.5 10*3/uL (ref 0.0–0.5)
Eosinophils Relative: 5 %
HCT: 38.9 % (ref 36.0–46.0)
Hemoglobin: 12.1 g/dL (ref 12.0–15.0)
Immature Granulocytes: 0 %
Lymphocytes Relative: 14 %
Lymphs Abs: 1.5 10*3/uL (ref 0.7–4.0)
MCH: 28.4 pg (ref 26.0–34.0)
MCHC: 31.1 g/dL (ref 30.0–36.0)
MCV: 91.3 fL (ref 80.0–100.0)
Monocytes Absolute: 0.9 10*3/uL (ref 0.1–1.0)
Monocytes Relative: 9 %
Neutro Abs: 7.7 10*3/uL (ref 1.7–7.7)
Neutrophils Relative %: 72 %
Platelets: 202 10*3/uL (ref 150–400)
RBC: 4.26 MIL/uL (ref 3.87–5.11)
RDW: 18.6 % — ABNORMAL HIGH (ref 11.5–15.5)
WBC: 10.6 10*3/uL — ABNORMAL HIGH (ref 4.0–10.5)
nRBC: 0 % (ref 0.0–0.2)

## 2024-02-14 LAB — LACTIC ACID, PLASMA: Lactic Acid, Venous: 2.5 mmol/L (ref 0.5–1.9)

## 2024-02-14 LAB — RESP PANEL BY RT-PCR (RSV, FLU A&B, COVID)  RVPGX2
Influenza A by PCR: NEGATIVE
Influenza B by PCR: NEGATIVE
Resp Syncytial Virus by PCR: NEGATIVE
SARS Coronavirus 2 by RT PCR: NEGATIVE

## 2024-02-14 LAB — BRAIN NATRIURETIC PEPTIDE: B Natriuretic Peptide: 2541.1 pg/mL — ABNORMAL HIGH (ref 0.0–100.0)

## 2024-02-14 MED ORDER — FUROSEMIDE 10 MG/ML IJ SOLN: 40.0000 mg | Freq: Once | INTRAMUSCULAR | Status: DC

## 2024-02-14 MED ORDER — LORAZEPAM 2 MG/ML IJ SOLN
0.5000 mg | Freq: Once | INTRAMUSCULAR | Status: AC
  Administered 2024-02-14: 0.5 mg via INTRAVENOUS
  Filled 2024-02-14: qty 1

## 2024-02-14 MED ORDER — IPRATROPIUM-ALBUTEROL 0.5-2.5 (3) MG/3ML IN SOLN
RESPIRATORY_TRACT | Status: AC
  Filled 2024-02-14: qty 9

## 2024-02-14 MED ORDER — DOXYCYCLINE HYCLATE 100 MG PO TABS: 100.0000 mg | ORAL_TABLET | Freq: Once | ORAL | Status: DC

## 2024-02-14 MED ORDER — SODIUM CHLORIDE 0.9 % IV SOLN
2.0000 g | Freq: Once | INTRAVENOUS | Status: AC
  Administered 2024-02-14: 2 g via INTRAVENOUS
  Filled 2024-02-14: qty 20

## 2024-02-14 MED ORDER — SODIUM CHLORIDE 0.9 % IV SOLN
100.0000 mg | Freq: Once | INTRAVENOUS | Status: AC
  Administered 2024-02-14: 100 mg via INTRAVENOUS
  Filled 2024-02-14: qty 100

## 2024-02-14 MED ORDER — METHYLPREDNISOLONE SODIUM SUCC 125 MG IJ SOLR
125.0000 mg | Freq: Once | INTRAMUSCULAR | Status: AC
  Administered 2024-02-14: 125 mg via INTRAVENOUS
  Filled 2024-02-14: qty 2

## 2024-02-14 NOTE — ED Triage Notes (Signed)
 BIB EMS from home with shortness of breath that started yesterday and got worse. Attempted home neb without relief. Upon EMS arrival, patient sats were in the 90s. 100% with CPAP. Given 5mg  albuterol pta.

## 2024-02-14 NOTE — ED Provider Notes (Signed)
 [place] EMERGENCY DEPARTMENT AT [facility] Provider Note   CSN: [id number] Arrival date & time: 02/14/24  2128     History  Chief Complaint  Patient presents with   Shortness of Breath    Brittany [name] is a 63 y.o. female.  63 year old female with past medical history of COPD and CHF presenting to the emergency department today with short of breath.  The patient has been short of breath now for the past few days.  She has had a cough.  This has become productive over the past day.  The patient states that earlier today she was coughing up a lot of of yellow sputum.  She denies any hemoptysis.  She reports that she has been increasing her diuretics at the request of her cardiologist because she has been gaining some weight recently.  She denies any leg pain or swelling.   Shortness of Breath Associated symptoms: cough        Home Medications Prior to Admission medications   Medication Sig Start Date End Date Taking? Authorizing Provider  albuterol (VENTOLIN HFA) 108 (90 Base) MCG/ACT inhaler Inhale 2 puffs into the lungs every 6 (six) hours as needed for wheezing or shortness of breath. 06/20/22   Zannie Cove, MD  atorvastatin (LIPITOR) 80 MG tablet Take 1 tablet (80 mg total) by mouth daily. 08/06/23 11/20/24  Lockie Mola, MD  CORLANOR 7.5 MG TABS tablet TAKE 1 TABLET BY MOUTH TWICE A DAY WITH FOOD 01/18/24   Bensimhon, Bevelyn Buckles, MD  cyclobenzaprine (FLEXERIL) 10 MG tablet Take 1 tablet (10 mg total) by mouth 3 (three) times daily as needed for muscle spasms. Patient taking differently: Take 0.5 mg by mouth daily as needed for muscle spasms. 10/11/23   Georganna Skeans, MD  digoxin (LANOXIN) 0.125 MG tablet TAKE 1 TABLET BY MOUTH DAILY Patient taking differently: Take 125 mcg by mouth at bedtime. 04/23/23   [name], Anderson Malta, FNP  ELIQUIS 2.5 MG TABS tablet TAKE 1 TABLET BY MOUTH TWICE A DAY 12/07/23   Bensimhon, Bevelyn Buckles, MD  FARXIGA 10 MG TABS tablet  TAKE 1 TABLET BY MOUTH EVERY DAY 11/21/23   Bensimhon, Bevelyn Buckles, MD  Fluticasone-Umeclidin-Vilant (TRELEGY ELLIPTA) 200-62.5-25 MCG/ACT AEPB Inhale 1 puff into the lungs daily. 08/16/23   Olalere, Onnie Boer A, MD  guaiFENesin-dextromethorphan (ROBITUSSIN DM) 100-10 MG/5ML syrup Take 5 mLs by mouth every 6 (six) hours as needed for cough. 09/29/23   Arrien, York Ram, MD  hydrOXYzine (VISTARIL) 25 MG capsule TAKE 1 CAPSULE (25 MG TOTAL) BY MOUTH DAILY AS NEEDED. 07/30/23   Rema Fendt, NP  ipratropium-albuterol (DUONEB) 0.5-2.5 (3) MG/3ML SOLN Take 3 mLs by nebulization every 4 (four) hours as needed (for shortness of breath). 01/15/24   Georganna Skeans, MD  megestrol (MEGACE) 20 MG tablet Take 1 tablet (20 mg total) by mouth 2 (two) times daily. 01/15/24   Georganna Skeans, MD  metolazone (ZAROXOLYN) 2.5 MG tablet Take 1 tablet (2.5 mg total) by mouth once a week. on Tuesday starting 01/15/2024 with 40 mEq of Potassium 3 times per day on metolazone days 01/14/24   Jacklynn Ganong, FNP  midodrine (PROAMATINE) 5 MG tablet Take 1 tablet (5 mg total) by mouth 3 (three) times daily with meals. 12/22/23   Ghimire, Werner Lean, MD  nitroGLYCERIN (NITROSTAT) 0.4 MG SL tablet Place 1 tablet (0.4 mg total) under the tongue every 5 (five) minutes as needed for chest pain. 02/14/19   Graciella Freer, PA-C  Nutritional  Supplements (ENSURE ORIGINAL) LIQD Take 1 Bottle by mouth See admin instructions. Drink 1 bottle 2-3 times daily.    [provider]  potassium chloride (KLOR-CON) 20 MEQ packet Take 40 mEq by mouth 2 (two) times daily. Take 40 mEq total twice a day except on Metolazone days of Tuesdays take (40 meq) 3 times per day. 01/14/24   Jacklynn Ganong, FNP  spironolactone (ALDACTONE) 25 MG tablet Take 1 tablet (25 mg total) by mouth at bedtime. 12/22/23 03/21/24  Ghimire, Werner Lean, MD  torsemide (DEMADEX) 20 MG tablet Take 4 tablets (80 mg total) by mouth 2 (two) times daily. 01/14/24   Jacklynn Ganong, FNP      Allergies    Lactose intolerance (gi) and Codeine    Review of Systems   Review of Systems  Respiratory:  Positive for cough and shortness of breath.   All other systems reviewed and are negative.   Physical Exam Updated Vital Signs BP (!) 93/53   Pulse 95   Resp 20   SpO2 100%  Physical Exam Vitals and nursing note reviewed.   Gen: Conversational dyspnea noted, patient comes in on CPAP and was transitioned to BiPAP Eyes: PERRL, EOMI HEENT: no oropharyngeal swelling Neck: trachea midline Resp: Diminished with wheezes and coarse breath sounds at lung bases Card: RRR, no murmurs, rubs, or gallops Abd: nontender, nondistended Extremities: no calf tenderness, no edema Vascular: 2+ radial pulses bilaterally, 2+ DP pulses bilaterally Skin: no rashes Psyc: acting appropriately   ED Results / Procedures / Treatments   Labs (all labs ordered are listed, but only abnormal results are displayed) Labs Reviewed  BASIC METABOLIC PANEL - Abnormal; Notable for the following components:      Result Value   CO2 20 (*)    Glucose, Bld 264 (*)    BUN 27 (*)    Creatinine, Ser 1.20 (*)    GFR, Estimated 51 (*)    Anion gap 18 (*)    All other components within normal limits  CBC WITH DIFFERENTIAL/PLATELET - Abnormal; Notable for the following components:   WBC 10.6 (*)    RDW 18.6 (*)    All other components within normal limits  BRAIN NATRIURETIC PEPTIDE - Abnormal; Notable for the following components:   B Natriuretic Peptide 2,541.1 (*)    All other components within normal limits  LACTIC ACID, PLASMA - Abnormal; Notable for the following components:   Lactic Acid, Venous 2.5 (*)    All other components within normal limits  I-STAT VENOUS BLOOD GAS, ED - Abnormal; Notable for the following components:   pO2, Ven 59 (*)    Potassium 5.6 (*)    Calcium, Ion 1.07 (*)    All other components within normal limits  RESP PANEL BY RT-PCR (RSV, FLU A&B, COVID)   RVPGX2  CULTURE, BLOOD (ROUTINE X 2)  CULTURE, BLOOD (ROUTINE X 2)  LACTIC ACID, PLASMA    EKG None  Radiology No results found.  Procedures Procedures    Medications Ordered in ED Medications  doxycycline (VIBRAMYCIN) 100 mg in sodium chloride 0.9 % 250 mL IVPB (100 mg Intravenous New Bag/Given 02/14/24 2259)  ipratropium-albuterol (DUONEB) 0.5-2.5 (3) MG/3ML nebulizer solution (  Given 02/14/24 2142)  cefTRIAXone (ROCEPHIN) 2 g in sodium chloride 0.9 % 100 mL IVPB (0 g Intravenous Stopped 02/14/24 2237)  methylPREDNISolone sodium succinate (SOLU-MEDROL) 125 mg/2 mL injection 125 mg (125 mg Intravenous Given 02/14/24 2208)  LORazepam (ATIVAN) injection 0.5 mg (0.5 mg  Intravenous Given 02/14/24 2238)    ED Course/ Medical Decision Making/ A&P                                 Medical Decision Making 63 year old female with past medical history of COPD and CHF presenting to the emergency department today with cough and shortness of breath.  The patient was placed on BiPAP for increased work of breathing.  I will initiate a sepsis workup on the patient.  Will cover her empirically with Rocephin and doxycycline.  Will give the patient DuoNebs as well as Solu-Medrol in the event this is due to her underlying COPD.  Will obtain RSV/COVID/flu swabs on the patient.  I will keep patient on BiPAP.  She will require admission.  Will hold off on fluids as there is question as to whether she is volume overloaded based on her physical exam.  The patient's chest x-ray interpreted by me does not show any large infiltrates or severe pulmonary edema.  There is no pneumothorax.  On reassessment she is looking much better on BiPAP.  Calls placed to hospitalist service for admission.  CRITICAL CARE Performed by: Durwin Glaze   Total critical care time: 40 minutes  Critical care time was exclusive of separately billable procedures and treating other patients.  Critical care was necessary to treat or prevent  imminent or life-threatening deterioration.  Critical care was time spent personally by me on the following activities: development of treatment plan with patient and/or surrogate as well as nursing, discussions with consultants, evaluation of patient's response to treatment, examination of patient, obtaining history from patient or surrogate, ordering and performing treatments and interventions, ordering and review of laboratory studies, ordering and review of radiographic studies, pulse oximetry and re-evaluation of patient's condition.   Amount and/or Complexity of Data Reviewed Labs: ordered. Radiology: ordered.  Risk Prescription drug management. Decision regarding hospitalization.           Final Clinical Impression(s) / ED Diagnoses Final diagnoses:  COPD exacerbation (HCC)  Congestive heart failure, unspecified HF chronicity, unspecified heart failure type [facility])    Rx / DC Orders ED Discharge Orders     None         Durwin Glaze, MD 02/14/24 2342

## 2024-02-14 NOTE — Progress Notes (Signed)
   02/14/24 2142  BiPAP/CPAP/SIPAP  $ Non-Invasive Ventilator  Non-Invasive Vent Set Up;Non-Invasive Vent Initial  $ Face Mask Small Yes  BiPAP/CPAP/SIPAP Pt Type Adult  BiPAP/CPAP/SIPAP SERVO  Mask Type Full face mask  Dentures removed? Not applicable  Mask Size Small  Set Rate 20 breaths/min  Respiratory Rate 33 breaths/min  IPAP 16 cmH20  EPAP 8 cmH2O  Pressure Support 8 cmH20  PEEP 8 cmH20  FiO2 (%) 40 %  Flow Rate 0 lpm  Minute Ventilation 12.3  Leak 10  Peak Inspiratory Pressure (PIP) 12  Tidal Volume (Vt) 558  Patient Home Equipment No  Auto Titrate No

## 2024-02-15 ENCOUNTER — Inpatient Hospital Stay (HOSPITAL_COMMUNITY)

## 2024-02-15 ENCOUNTER — Other Ambulatory Visit: Payer: Self-pay

## 2024-02-15 ENCOUNTER — Encounter: Payer: Self-pay | Admitting: *Deleted

## 2024-02-15 DIAGNOSIS — Z66 Do not resuscitate: Secondary | ICD-10-CM | POA: Diagnosis present

## 2024-02-15 DIAGNOSIS — J9621 Acute and chronic respiratory failure with hypoxia: Secondary | ICD-10-CM | POA: Diagnosis present

## 2024-02-15 DIAGNOSIS — I5043 Acute on chronic combined systolic (congestive) and diastolic (congestive) heart failure: Secondary | ICD-10-CM | POA: Diagnosis not present

## 2024-02-15 DIAGNOSIS — J441 Chronic obstructive pulmonary disease with (acute) exacerbation: Secondary | ICD-10-CM | POA: Diagnosis present

## 2024-02-15 DIAGNOSIS — I2510 Atherosclerotic heart disease of native coronary artery without angina pectoris: Secondary | ICD-10-CM | POA: Diagnosis present

## 2024-02-15 DIAGNOSIS — E43 Unspecified severe protein-calorie malnutrition: Secondary | ICD-10-CM

## 2024-02-15 DIAGNOSIS — I480 Paroxysmal atrial fibrillation: Secondary | ICD-10-CM

## 2024-02-15 DIAGNOSIS — J439 Emphysema, unspecified: Secondary | ICD-10-CM

## 2024-02-15 DIAGNOSIS — E1165 Type 2 diabetes mellitus with hyperglycemia: Secondary | ICD-10-CM | POA: Diagnosis present

## 2024-02-15 DIAGNOSIS — E1122 Type 2 diabetes mellitus with diabetic chronic kidney disease: Secondary | ICD-10-CM | POA: Diagnosis present

## 2024-02-15 DIAGNOSIS — Z79899 Other long term (current) drug therapy: Secondary | ICD-10-CM | POA: Diagnosis not present

## 2024-02-15 DIAGNOSIS — Z951 Presence of aortocoronary bypass graft: Secondary | ICD-10-CM | POA: Diagnosis not present

## 2024-02-15 DIAGNOSIS — N179 Acute kidney failure, unspecified: Secondary | ICD-10-CM | POA: Diagnosis present

## 2024-02-15 DIAGNOSIS — Z006 Encounter for examination for normal comparison and control in clinical research program: Secondary | ICD-10-CM

## 2024-02-15 DIAGNOSIS — I5082 Biventricular heart failure: Secondary | ICD-10-CM | POA: Diagnosis present

## 2024-02-15 DIAGNOSIS — N1832 Chronic kidney disease, stage 3b: Secondary | ICD-10-CM

## 2024-02-15 DIAGNOSIS — T380X5A Adverse effect of glucocorticoids and synthetic analogues, initial encounter: Secondary | ICD-10-CM | POA: Diagnosis present

## 2024-02-15 DIAGNOSIS — I5023 Acute on chronic systolic (congestive) heart failure: Secondary | ICD-10-CM

## 2024-02-15 DIAGNOSIS — E1169 Type 2 diabetes mellitus with other specified complication: Secondary | ICD-10-CM | POA: Diagnosis present

## 2024-02-15 DIAGNOSIS — E785 Hyperlipidemia, unspecified: Secondary | ICD-10-CM | POA: Diagnosis present

## 2024-02-15 DIAGNOSIS — I10 Essential (primary) hypertension: Secondary | ICD-10-CM | POA: Diagnosis not present

## 2024-02-15 DIAGNOSIS — Z7984 Long term (current) use of oral hypoglycemic drugs: Secondary | ICD-10-CM | POA: Diagnosis not present

## 2024-02-15 DIAGNOSIS — I509 Heart failure, unspecified: Secondary | ICD-10-CM | POA: Diagnosis present

## 2024-02-15 DIAGNOSIS — J440 Chronic obstructive pulmonary disease with acute lower respiratory infection: Secondary | ICD-10-CM | POA: Diagnosis present

## 2024-02-15 DIAGNOSIS — I130 Hypertensive heart and chronic kidney disease with heart failure and stage 1 through stage 4 chronic kidney disease, or unspecified chronic kidney disease: Secondary | ICD-10-CM | POA: Diagnosis present

## 2024-02-15 DIAGNOSIS — Z7901 Long term (current) use of anticoagulants: Secondary | ICD-10-CM | POA: Diagnosis not present

## 2024-02-15 DIAGNOSIS — D631 Anemia in chronic kidney disease: Secondary | ICD-10-CM | POA: Diagnosis present

## 2024-02-15 DIAGNOSIS — Z8249 Family history of ischemic heart disease and other diseases of the circulatory system: Secondary | ICD-10-CM | POA: Diagnosis not present

## 2024-02-15 DIAGNOSIS — Z1152 Encounter for screening for COVID-19: Secondary | ICD-10-CM | POA: Diagnosis not present

## 2024-02-15 LAB — BASIC METABOLIC PANEL
Anion gap: 14 (ref 5–15)
BUN: 31 mg/dL — ABNORMAL HIGH (ref 8–23)
CO2: 23 mmol/L (ref 22–32)
Calcium: 9.9 mg/dL (ref 8.9–10.3)
Chloride: 98 mmol/L (ref 98–111)
Creatinine, Ser: 1.36 mg/dL — ABNORMAL HIGH (ref 0.44–1.00)
GFR, Estimated: 44 mL/min — ABNORMAL LOW (ref >60)
Glucose, Bld: 294 mg/dL — ABNORMAL HIGH (ref 70–99)
Potassium: 4.1 mmol/L (ref 3.5–5.1)
Sodium: 135 mmol/L (ref 135–145)

## 2024-02-15 LAB — CBC
HCT: 36.5 % (ref 36.0–46.0)
Hemoglobin: 11.7 g/dL — ABNORMAL LOW (ref 12.0–15.0)
MCH: 28.1 pg (ref 26.0–34.0)
MCHC: 32.1 g/dL (ref 30.0–36.0)
MCV: 87.7 fL (ref 80.0–100.0)
Platelets: 184 10*3/uL (ref 150–400)
RBC: 4.16 MIL/uL (ref 3.87–5.11)
RDW: 18.4 % — ABNORMAL HIGH (ref 11.5–15.5)
WBC: 8.1 10*3/uL (ref 4.0–10.5)
nRBC: 0 % (ref 0.0–0.2)

## 2024-02-15 LAB — LACTIC ACID, PLASMA: Lactic Acid, Venous: 1.5 mmol/L (ref 0.5–1.9)

## 2024-02-15 LAB — DIGOXIN LEVEL: Digoxin Level: <0.2 ng/mL — ABNORMAL LOW (ref 0.8–2.0)

## 2024-02-15 MED ORDER — IPRATROPIUM-ALBUTEROL 0.5-2.5 (3) MG/3ML IN SOLN
3.0000 mL | Freq: Four times a day (QID) | RESPIRATORY_TRACT | Status: DC
  Administered 2024-02-15 – 2024-02-16 (×3): 3 mL via RESPIRATORY_TRACT
  Filled 2024-02-15 (×3): qty 3

## 2024-02-15 MED ORDER — LEVALBUTEROL HCL 0.63 MG/3ML IN NEBU
0.6300 mg | INHALATION_SOLUTION | Freq: Four times a day (QID) | RESPIRATORY_TRACT | Status: DC | PRN
  Filled 2024-02-15: qty 3

## 2024-02-15 MED ORDER — UMECLIDINIUM BROMIDE 62.5 MCG/ACT IN AEPB
1.0000 | INHALATION_SPRAY | Freq: Every day | RESPIRATORY_TRACT | Status: DC
  Filled 2024-02-15: qty 7

## 2024-02-15 MED ORDER — ACETAMINOPHEN 650 MG RE SUPP: 650.0000 mg | Freq: Four times a day (QID) | RECTAL | Status: DC | PRN

## 2024-02-15 MED ORDER — FLUTICASONE FUROATE-VILANTEROL 200-25 MCG/ACT IN AEPB
1.0000 | INHALATION_SPRAY | Freq: Every day | RESPIRATORY_TRACT | Status: DC
  Administered 2024-02-15: 1 via RESPIRATORY_TRACT
  Filled 2024-02-15: qty 28

## 2024-02-15 MED ORDER — DIGOXIN 125 MCG PO TABS
125.0000 ug | ORAL_TABLET | Freq: Every day | ORAL | Status: DC
  Administered 2024-02-15 – 2024-02-19 (×5): 125 ug via ORAL
  Filled 2024-02-15 (×5): qty 1

## 2024-02-15 MED ORDER — FUROSEMIDE 10 MG/ML IJ SOLN
80.0000 mg | Freq: Two times a day (BID) | INTRAMUSCULAR | Status: DC
  Administered 2024-02-15 – 2024-02-18 (×7): 80 mg via INTRAVENOUS
  Filled 2024-02-15 (×7): qty 8

## 2024-02-15 MED ORDER — SODIUM CHLORIDE 0.9 % IV SOLN: 100.0000 mg | Freq: Two times a day (BID) | INTRAVENOUS | Status: DC

## 2024-02-15 MED ORDER — IVABRADINE 2.5 MG HALF TABLET
7.5000 mg | ORAL_TABLET | Freq: Two times a day (BID) | ORAL | Status: DC
  Administered 2024-02-15 – 2024-02-19 (×9): 7.5 mg via ORAL
  Filled 2024-02-15 (×10): qty 1

## 2024-02-15 MED ORDER — HYDROXYZINE PAMOATE 25 MG PO CAPS
25.0000 mg | ORAL_CAPSULE | Freq: Every day | ORAL | Status: DC | PRN
  Filled 2024-02-15: qty 1

## 2024-02-15 MED ORDER — MEGESTROL ACETATE 20 MG PO TABS
20.0000 mg | ORAL_TABLET | Freq: Two times a day (BID) | ORAL | Status: DC
  Administered 2024-02-15 – 2024-02-19 (×8): 20 mg via ORAL
  Filled 2024-02-15 (×10): qty 1

## 2024-02-15 MED ORDER — SPIRONOLACTONE 25 MG PO TABS
25.0000 mg | ORAL_TABLET | Freq: Every day | ORAL | Status: DC
  Administered 2024-02-15 – 2024-02-18 (×4): 25 mg via ORAL
  Filled 2024-02-15 (×4): qty 1

## 2024-02-15 MED ORDER — MIDODRINE HCL 5 MG PO TABS
2.5000 mg | ORAL_TABLET | Freq: Three times a day (TID) | ORAL | Status: DC
  Administered 2024-02-15 – 2024-02-19 (×12): 2.5 mg via ORAL
  Filled 2024-02-15 (×14): qty 1

## 2024-02-15 MED ORDER — METOLAZONE 2.5 MG PO TABS: 2.5000 mg | ORAL_TABLET | ORAL | Status: DC

## 2024-02-15 MED ORDER — PREDNISONE 20 MG PO TABS
40.0000 mg | ORAL_TABLET | Freq: Every day | ORAL | Status: DC
  Administered 2024-02-15 – 2024-02-18 (×4): 40 mg via ORAL
  Filled 2024-02-15 (×4): qty 2

## 2024-02-15 MED ORDER — BUDESONIDE 0.25 MG/2ML IN SUSP
0.2500 mg | Freq: Two times a day (BID) | RESPIRATORY_TRACT | Status: DC
  Administered 2024-02-15 – 2024-02-19 (×8): 0.25 mg via RESPIRATORY_TRACT
  Filled 2024-02-15 (×8): qty 2

## 2024-02-15 MED ORDER — FUROSEMIDE 10 MG/ML IJ SOLN
40.0000 mg | Freq: Two times a day (BID) | INTRAMUSCULAR | Status: DC
  Administered 2024-02-15: 40 mg via INTRAVENOUS
  Filled 2024-02-15: qty 4

## 2024-02-15 MED ORDER — ATORVASTATIN CALCIUM 80 MG PO TABS
80.0000 mg | ORAL_TABLET | Freq: Every day | ORAL | Status: DC
  Administered 2024-02-15 – 2024-02-19 (×5): 80 mg via ORAL
  Filled 2024-02-15 (×4): qty 1
  Filled 2024-02-15: qty 2

## 2024-02-15 MED ORDER — IPRATROPIUM-ALBUTEROL 0.5-2.5 (3) MG/3ML IN SOLN
3.0000 mL | RESPIRATORY_TRACT | Status: DC | PRN
  Administered 2024-02-15: 3 mL via RESPIRATORY_TRACT
  Filled 2024-02-15: qty 3

## 2024-02-15 MED ORDER — ACETAMINOPHEN 325 MG PO TABS: 650.0000 mg | ORAL_TABLET | Freq: Four times a day (QID) | ORAL | Status: DC | PRN

## 2024-02-15 NOTE — Assessment & Plan Note (Addendum)
 Echocardiogram with decreases systolic function EF <20%, with global hypokinesis, LV cavity dilatated, RV with moderate to severe reduction in systolic function, LA and RA with normal size, no significant valvular disease.   Biventricular failure with poor prognosis.   Urine output documented at 600 cc  Documented weigh is up 3 Kg.   Continue diuresis with furosemide 80 mg IV bid.  Spironolactone.  Continue blood pressure monitoring. Continue ivabradin, digoxin and midodrine. Resume SGLT 2 inh   Acute on chronic hypoxemic respiratory failure due to acute cardiogenic pulmonary edema and COPD exacerbation.  Continue bronchodilator therapy, steroids and diuresis.  Continue oxymetry monitoring, and supplemental 02 to keep 02 saturation 88% or greater.

## 2024-02-15 NOTE — Progress Notes (Signed)
 Progress Note   Patient: Brittany [name] WUJ:[id number] DOB: 12/17/1960 DOA: 02/14/2024     0 DOS: the patient was seen and examined on 02/15/2024   Brief hospital course: Brittany [name] was admitted to the hospital with the working diagnosis of heart failure exacerbation.   63 yo female with the past medical history of COPD, paroxysmal atrial fibrillation, hypertension, coronary artery disease, hyperlipidemia, heart failure, and small cell lung cancer sp radiation and chemotherapy in 2015 who presented with dyspnea. Reported 2 days of progressive dyspnea. Severe symptoms that prompted her to call EMS. She was found hypoxemic and in respiratory distress, 02 sat 90%. She was placed on Cpap and transported to the ED.  On her initial physical examination her blood pressure was 107/70, HR 104, RR 24 and 02 saturation 100% on Cpap.  Lungs with decreased breath sounds bilaterally, with no wheezing or rhonchi, heart with S1 and S2 present and regular, no gallops or rubs, abdomen with no distention and no lower extremity edema.   Na 142, K 3.6 cl 104, bicarbonate 20 glucose 264, bun 27 cr 1.20 BNP 2,541  Lactic acid 2,5 and 1.5  Wbc 10.6 hgb 12.1 plt 202   Sars covid 19 negative  Influenza negative   Chest radiograph with hyperinflation, positive cardiomegaly, with small right pleural effusion, mild bilateral hilar vascular congestion. Defibrillator in place with lead in the RV. Right upper chest with neuro stimulator. Positive sternotomy wires in place.   EKG 110 bpm, left axis deviation, normal intervals (positive artifact), sinus rhythm with q waves lead I and aVL, with no significant ST segment or T wave changes.    Assessment and Plan: * Acute on chronic systolic CHF (congestive heart failure) (HCC) Echocardiogram with decreases systolic function EF <20%, with global hypokinesis, LV cavity dilatated, RV with moderate to severe reduction in systolic function, LA and RA with normal  size, no significant valvular disease.   Biventricular failure with poor prognosis.   Patient continue volume overloaded. Plan to continue diuresis with furosemide, will increase to 80 mg IV bid Spironolactone.  Continue blood pressure monitoring. Continue ivabradin, digoxin and midodrine.  Acute on chronic hypoxemic respiratory failure due to acute cardiogenic pulmonary edema and COPD exacerbation.  Continue bronchodilator therapy, steroids and diuresis.  Continue oxymetry monitoring, and supplemental 02 to keep 02 saturation 88% or greater.   PAF (paroxysmal atrial fibrillation) (HCC) Continue rate control with digoxin and ivabradine.  Anticoagulation with apixaban.   CAD (coronary artery disease) No chest pain, no signs of acute coronary syndrome.   Essential hypertension Continue blood pressure support with midodrine. Continue diuresis with furosemide and spironolactone.   CKD stage 3b, GFR 30-44 ml/min (HCC) Check renal function today.  Continue diuresis with furosemide and spironolactone.   Protein-calorie malnutrition, severe (HCC) Continue nutritional supplements.   COPD (chronic obstructive pulmonary disease) (HCC) Acute exacerbation.   Plan to continue systemic corticosteroids with prednisone and inhaled corticosteroids with budesonide Continue bronchodilator therapy with duoneb.  Oxymetry monitoring and supplemental 02 per [place].   Type 2 diabetes mellitus with hyperlipidemia (HCC) Continue glucose cover and monitoring with insulin sliding scale.  Continue with statin therapy.         Subjective: Patient is feeling better but not yet back to baseline, continue to have dyspnea and cough.   Physical Exam: Vitals:   02/15/24 0100 02/15/24 0345 02/15/24 0530 02/15/24 0600  BP: 114/68 105/64 (!) 93/58 98/67  Pulse: 91 88 85 90  Resp: (!) 21 18 18  (!)  22  Temp:  98 F (36.7 C)    TempSrc:  Oral    SpO2: 100% 100% 100% 100%   Neurology awake and alert,  deconditioned and ill looking appearing  ENT with mild pallor with no icterus Cardiovascular with S1 and S2 present and regular with no gallops, rubs or murmurs Mild to moderate JVD No lower extremity edema Respiratory with decreased breath sounds and scattered rales with no wheezing, but prolonged expiratory phase Abdomen with no distention  Data Reviewed:    Family Communication: no family at the bedside   Disposition: Status is: Inpatient Remains inpatient appropriate because: IV diuresis   Planned Discharge Destination: Home     Author: Coralie Keens, MD 02/15/2024 7:49 AM  For on call review [email or web address].

## 2024-02-15 NOTE — Progress Notes (Signed)
 RT took pt off BIPAP and placed on 2l [place]. Pt is tolerating well and vitals stable. RT will monitor.  02/15/24 1954  Therapy Vitals  Pulse Rate 98  Resp (!) 22  MEWS Score/Color  MEWS Score 1  MEWS Score Color Green  Oxygen Therapy/Pulse Ox  O2 Device (S)  Nasal Cannula  O2 Therapy Oxygen  O2 Flow Rate (L/min) 2 L/min  SpO2 100 %

## 2024-02-15 NOTE — Assessment & Plan Note (Signed)
Continue nutritional supplements. °

## 2024-02-15 NOTE — Assessment & Plan Note (Signed)
 Continue rate control with digoxin and ivabradine.  Anticoagulation with apixaban.

## 2024-02-15 NOTE — Assessment & Plan Note (Addendum)
 Uncontrolled with hyperglycemia.  Steroid induced hyperglycemia.   Patient was placed on insulin therapy, basal, pre meal and sliding scale with good toleration.  At the time of her discharge her fasting glucose is 103 mg/dl.  She will resume her home diabetic regimen.  Continue with statin therapy.

## 2024-02-15 NOTE — Assessment & Plan Note (Addendum)
 Stable renal function with serum cr at 1,46 with K at 4,0 and serum bicarbonate at 24  Na 137 and Mg 2.4   Plan to continue diuresis with furosemide Follow up renal function and electrolytes in am.

## 2024-02-15 NOTE — Assessment & Plan Note (Addendum)
 CABG  No further chest pain.  High sensitive troponin elevation 120 and  220  Acute coronary syndrome has been ruled out.   Last cardiac catheterization in 2020 with significant underlying 2 vessel coronary artery disease with patent SVG to OM and SVG to diagonal. Had drug eluding stent placed to ostial ramus extending into the distal left main.   Continue statin, and aspirin.

## 2024-02-15 NOTE — Assessment & Plan Note (Addendum)
 Continue blood pressure support with midodrine. Continue diuresis with torsemide, emapgliflozin and spironolactone.

## 2024-02-15 NOTE — ED Notes (Signed)
 Patient transported to CT

## 2024-02-15 NOTE — Progress Notes (Signed)
 Heart Failure Navigator Progress Note  Assessed for Heart & Vascular TOC clinic readiness.  Patient does not meet criteria due to Advanced Heart Failure Tem patient of Dr. [name]. .   Navigator will sign off at this time.   Rhae Hammock, BSN, Scientist, clinical (histocompatibility and immunogenetics) Only

## 2024-02-15 NOTE — Progress Notes (Signed)
 Pt arrived to the unit. She was having shortness of breath with a little movement in the bed. She asked to put bipap after eating some food.

## 2024-02-15 NOTE — H&P (Addendum)
 History and Physical    Brittany [name] ZOX:[id number] DOB: [birth year] DOA: 02/14/2024  PCP: Georganna Skeans, MD  Patient coming from: home  I have personally briefly reviewed patient's old medical records in [facility]  Chief Complaint: sob  HPI: Brittany [name] is a 63 y.o. 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  Per EMS patient sat was 90% . Patient was placed on CPAP and improved to 100%.  Patient noted no fever/chills/ n/v/d/ or dysuria. He denies current chest pain.    ED Course:  IN ED vitals: afeb, bp 107/70, hr 104, rr 24 sat 100% on cpap  Vbg:7.35/45 Wbc 10.6, hgb 12.1, plt202 Na 143,K 3.6, CL 104, bicarb 20, glu 264, cr 1.20 AG 18 BNP 25411 Lactic 2.5 EKG: poor quality EKG repeat pending,  RVP : neg Cxr: IMPRESSION: 1. Evidence of prior median sternotomy/CABG. 2. Small right pleural effusion.  Vbg:7.35/45   Tx douneb, ctx, methylprednisone, doxcycline,lorazepam 0.5mg  iv x 1   Review of Systems: As per HPI otherwise 10 point review of systems negative.   Past Medical History:  Diagnosis Date   Acute respiratory failure (HCC) 05/05/2018   Acute systolic congestive heart failure (HCC) 02/03/2018   AICD (automatic cardioverter/defibrillator) present    Allergy    Anxiety    Asthma    DM2 (diabetes mellitus, type 2) (HCC) 10/20/2020   Hypertension    PAF (paroxysmal atrial fibrillation) (HCC)    Presence of permanent cardiac pacemaker    Prophylactic measure 08/03/14-08/19/14   Prophyl. 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   LIMA to LAD, SVG to D1, SVG to OM1, EVH via right  thigh with implantation of Impella LD LVAD via direct aortic approach   Small cell lung cancer (HCC) 03/16/2014    Past Surgical History:  Procedure Laterality Date   BRONCHIAL BRUSHINGS  10/25/2020   Procedure: BRONCHIAL BRUSHINGS;  Surgeon: Leslye Peer, MD;  Location: [facility] ENDOSCOPY;  Service: Pulmonary;;   BRONCHIAL NEEDLE ASPIRATION BIOPSY  10/25/2020   Procedure: BRONCHIAL NEEDLE ASPIRATION BIOPSIES;  Surgeon: Leslye Peer, MD;  Location: [facility] ENDOSCOPY;  Service: Pulmonary;;   CARDIAC DEFIBRILLATOR PLACEMENT  08/15/2018   MDT Visia AF MRI VR ICD implanted by Dr Georgena Spurling for primary prevention of sudden   CESAREAN SECTION     CORONARY ARTERY BYPASS GRAFT N/A 02/03/2018   Procedure: CORONARY ARTERY BYPASS GRAFTING (CABG);  Surgeon: Purcell Nails, MD;  Location: [facility] OR;  Service: Open Heart Surgery;  Laterality: N/A;  Time 3 using left internal mammary artery and endoscopically harvested right saphenous vein   CORONARY BALLOON ANGIOPLASTY N/A 02/03/2018   Procedure: CORONARY BALLOON ANGIOPLASTY;  Surgeon: Swaziland, Peter M, MD;  Location: [facility] INVASIVE CV LAB;  Service: Cardiovascular;  Laterality: N/A;   CORONARY STENT INTERVENTION N/A 02/12/2019   Procedure: CORONARY STENT INTERVENTION;  Surgeon: Iran Ouch, MD;  Location: [facility] INVASIVE CV LAB;  Service: Cardiovascular;  Laterality: N/A;   CORONARY/GRAFT ACUTE MI REVASCULARIZATION N/A 02/03/2018   Procedure: Coronary/Graft Acute MI Revascularization;  Surgeon: Swaziland, Peter M, MD;  Location: [facility] INVASIVE CV LAB;  Service: Cardiovascular;  Laterality: N/A;   ENDOBRONCHIAL ULTRASOUND N/A 10/25/2020  Procedure: ENDOBRONCHIAL ULTRASOUND;  Surgeon: Leslye Peer, MD;  Location: [facility] ENDOSCOPY;  Service: Pulmonary;  Laterality: N/A;   FLEXIBLE BRONCHOSCOPY  10/25/2020   Procedure: FLEXIBLE BRONCHOSCOPY;  Surgeon: Leslye Peer, MD;  Location: [facility] ENDOSCOPY;  Service: Pulmonary;;   IABP INSERTION N/A 02/03/2018   Procedure: IABP Insertion;   Surgeon: Swaziland, Peter M, MD;  Location: [facility] INVASIVE CV LAB;  Service: Cardiovascular;  Laterality: N/A;   INTRAOPERATIVE TRANSESOPHAGEAL ECHOCARDIOGRAM N/A 02/03/2018   Procedure: INTRAOPERATIVE TRANSESOPHAGEAL ECHOCARDIOGRAM;  Surgeon: Purcell Nails, MD;  Location: [facility] OR;  Service: Open Heart Surgery;  Laterality: N/A;   LEFT HEART CATH AND CORONARY ANGIOGRAPHY N/A 02/03/2018   Procedure: LEFT HEART CATH AND CORONARY ANGIOGRAPHY;  Surgeon: Swaziland, Peter M, MD;  Location: [facility] INVASIVE CV LAB;  Service: Cardiovascular;  Laterality: N/A;   LEFT HEART CATH AND CORS/GRAFTS ANGIOGRAPHY N/A 02/12/2019   Procedure: LEFT HEART CATH AND CORS/GRAFTS ANGIOGRAPHY;  Surgeon: Iran Ouch, MD;  Location: [facility] INVASIVE CV LAB;  Service: Cardiovascular;  Laterality: N/A;   MEDIASTINOSCOPY N/A 03/11/2014   Procedure: MEDIASTINOSCOPY;  Surgeon: Loreli Slot, MD;  Location: [facility] OR;  Service: Thoracic;  Laterality: N/A;   PLACEMENT OF IMPELLA LEFT VENTRICULAR ASSIST DEVICE  02/03/2018   Procedure: PLACEMENT OF IMPELLA LEFT VENTRICULAR ASSIST DEVICE LD;  Surgeon: Purcell Nails, MD;  Location: [facility] OR;  Service: Open Heart Surgery;;   REMOVAL OF IMPELLA LEFT VENTRICULAR ASSIST DEVICE N/A 02/08/2018   Procedure: REMOVAL OF IMPELLA LEFT VENTRICULAR ASSIST DEVICE;  Surgeon: Purcell Nails, MD;  Location: [facility] OR;  Service: Open Heart Surgery;  Laterality: N/A;   RIGHT HEART CATH N/A 02/03/2018   Procedure: RIGHT HEART CATH;  Surgeon: Swaziland, Peter M, MD;  Location: [facility] INVASIVE CV LAB;  Service: Cardiovascular;  Laterality: N/A;   RIGHT HEART CATH N/A 05/09/2018   Procedure: RIGHT HEART CATH;  Surgeon: Dolores Patty, MD;  Location: [facility] INVASIVE CV LAB;  Service: Cardiovascular;  Laterality: N/A;   TEE WITHOUT CARDIOVERSION N/A 02/08/2018   Procedure: TRANSESOPHAGEAL ECHOCARDIOGRAM (TEE);  Surgeon: Purcell Nails, MD;  Location: [facility] OR;  Service: Open Heart Surgery;  Laterality: N/A;   TUBAL LIGATION     VIDEO  BRONCHOSCOPY WITH ENDOBRONCHIAL ULTRASOUND N/A 03/11/2014   Procedure: VIDEO BRONCHOSCOPY WITH ENDOBRONCHIAL ULTRASOUND;  Surgeon: Loreli Slot, MD;  Location: [facility] OR;  Service: Thoracic;  Laterality: N/A;     reports that she quit smoking about 9 years ago. Her smoking use included cigarettes. She started smoking about 29 years ago. She has a 20 pack-year smoking history. She has never used smokeless tobacco. She reports current alcohol use of about 5.0 standard drinks of alcohol per week. She reports current drug use. Drug: Marijuana.  Allergies  Allergen Reactions   Lactose Intolerance (Gi) Diarrhea   Codeine Nausea And Vomiting    Family History  Problem Relation Age of Onset   Heart disease Mother    Hypertension Mother    Heart attack Mother    Hypertension Maternal Grandmother    Cancer Maternal Grandmother    Diabetes Paternal Grandmother    Stroke Neg Hx     Prior to Admission medications   Medication Sig Start Date End Date Taking? Authorizing Provider  albuterol (VENTOLIN HFA) 108 (90 Base) MCG/ACT inhaler Inhale 2 puffs into the lungs every 6 (six) hours as needed for wheezing or shortness of breath. 06/20/22  Yes Zannie Cove, MD  atorvastatin (LIPITOR) 80 MG tablet Take 1 tablet (  80 mg total) by mouth daily. 08/06/23 11/20/24 Yes Lockie Mola, MD  CORLANOR 7.5 MG TABS tablet TAKE 1 TABLET BY MOUTH TWICE A DAY WITH FOOD 01/18/24  Yes Bensimhon, Bevelyn Buckles, MD  cyclobenzaprine (FLEXERIL) 10 MG tablet Take 1 tablet (10 mg total) by mouth 3 (three) times daily as needed for muscle spasms. Patient taking differently: Take 0.5 mg by mouth daily as needed for muscle spasms. 10/11/23   Georganna Skeans, MD  digoxin (LANOXIN) 0.125 MG tablet TAKE 1 TABLET BY MOUTH DAILY Patient taking differently: Take 125 mcg by mouth at bedtime. 04/23/23   [name], Anderson Malta, FNP  ELIQUIS 2.5 MG TABS tablet TAKE 1 TABLET BY MOUTH TWICE A DAY 12/07/23   Bensimhon, Bevelyn Buckles, MD  FARXIGA 10 MG  TABS tablet TAKE 1 TABLET BY MOUTH EVERY DAY 11/21/23   Bensimhon, Bevelyn Buckles, MD  Fluticasone-Umeclidin-Vilant (TRELEGY ELLIPTA) 200-62.5-25 MCG/ACT AEPB Inhale 1 puff into the lungs daily. 08/16/23   Olalere, Onnie Boer A, MD  hydrOXYzine (VISTARIL) 25 MG capsule TAKE 1 CAPSULE (25 MG TOTAL) BY MOUTH DAILY AS NEEDED. 07/30/23   Zonia Kief, Amy J, NP  ipratropium-albuterol (DUONEB) 0.5-2.5 (3) MG/3ML SOLN Take 3 mLs by nebulization every 4 (four) hours as needed (for shortness of breath). 01/15/24   Georganna Skeans, MD  megestrol (MEGACE) 20 MG tablet Take 1 tablet (20 mg total) by mouth 2 (two) times daily. 01/15/24   Georganna Skeans, MD  metolazone (ZAROXOLYN) 2.5 MG tablet Take 1 tablet (2.5 mg total) by mouth once a week. on Tuesday starting 01/15/2024 with 40 mEq of Potassium 3 times per day on metolazone days 01/14/24   Jacklynn Ganong, FNP  midodrine (PROAMATINE) 5 MG tablet Take 1 tablet (5 mg total) by mouth 3 (three) times daily with meals. 12/22/23   Ghimire, Werner Lean, MD  nitroGLYCERIN (NITROSTAT) 0.4 MG SL tablet Place 1 tablet (0.4 mg total) under the tongue every 5 (five) minutes as needed for chest pain. 02/14/19   Graciella Freer, PA-C  Nutritional Supplements (ENSURE ORIGINAL) LIQD Take 1 Bottle by mouth See admin instructions. Drink 1 bottle 2-3 times daily.    [provider]  potassium chloride (KLOR-CON) 20 MEQ packet Take 40 mEq by mouth 2 (two) times daily. Take 40 mEq total twice a day except on Metolazone days of Tuesdays take (40 meq) 3 times per day. 01/14/24   Jacklynn Ganong, FNP  spironolactone (ALDACTONE) 25 MG tablet Take 1 tablet (25 mg total) by mouth at bedtime. 12/22/23 03/21/24  Ghimire, Werner Lean, MD  torsemide (DEMADEX) 20 MG tablet Take 4 tablets (80 mg total) by mouth 2 (two) times daily. 01/14/24   Jacklynn Ganong, FNP    Physical Exam: Vitals:   02/14/24 2230 02/14/24 2245 02/14/24 2300 02/14/24 2315  BP: (!) 103/55 112/63 104/61 (!) 93/53  Pulse: (!)  103 98 97 95  Resp: (!) 25 (!) 22 20 20   SpO2: 100% 100% 100% 100%    Constitutional: NAD, calm, comfortable Vitals:   02/14/24 2230 02/14/24 2245 02/14/24 2300 02/14/24 2315  BP: (!) 103/55 112/63 104/61 (!) 93/53  Pulse: (!) 103 98 97 95  Resp: (!) 25 (!) 22 20 20   SpO2: 100% 100% 100% 100%   Eyes: PERRL, lids and conjunctivae normal ENMT: Mucous membranes are moist. Posterior pharynx clear of any exudate or lesions.Normal dentition.  Neck: normal, supple, no masses, no thyromegaly Respiratory: diminshed, no wheezing, no crackles. Normal respiratory effort. No accessory muscle use.  Cardiovascular:  Regular rate and rhythm, no murmurs / rubs / gallops. No extremity edema. 2+ pedal pulses. Abdomen: no tenderness, no masses palpated. No hepatosplenomegaly. Bowel sounds positive.  Musculoskeletal: no clubbing / cyanosis. No joint deformity upper and lower extremities. Good ROM, no contractures. Normal muscle tone.  Skin: no rashes, lesions, ulcers. No induration Neurologic: CN 2-12 grossly intact. Sensation intact, . Strength 5/5 in all 4.  Psychiatric: Normal judgment and insight. Alert and oriented x 3. Normal mood.    Labs on Admission: I have personally reviewed following labs and imaging studies  CBC: Recent Labs  Lab 02/14/24 2132 02/14/24 2210  WBC 10.6*  --   NEUTROABS 7.7  --   HGB 12.1 13.3  HCT 38.9 39.0  MCV 91.3  --   PLT 202  --    Basic Metabolic Panel: Recent Labs  Lab 02/14/24 2132 02/14/24 2210  NA 142 137  K 3.6 5.6*  CL 104  --   CO2 20*  --   GLUCOSE 264*  --   BUN 27*  --   CREATININE 1.20*  --   CALCIUM 9.7  --    GFR: CrCl cannot be calculated (Unknown ideal weight.). Liver Function Tests: No results for input(s): "AST", "ALT", "ALKPHOS", "BILITOT", "PROT", "ALBUMIN" in the last 168 hours. No results for input(s): "LIPASE", "AMYLASE" in the last 168 hours. No results for input(s): "AMMONIA" in the last 168 hours. Coagulation  Profile: No results for input(s): "INR", "PROTIME" in the last 168 hours. Cardiac Enzymes: No results for input(s): "CKTOTAL", "CKMB", "CKMBINDEX", "TROPONINI" in the last 168 hours. BNP (last 3 results) No results for input(s): "PROBNP" in the last 8760 hours. HbA1C: No results for input(s): "HGBA1C" in the last 72 hours. CBG: No results for input(s): "GLUCAP" in the last 168 hours. Lipid Profile: No results for input(s): "CHOL", "HDL", "LDLCALC", "TRIG", "CHOLHDL", "LDLDIRECT" in the last 72 hours. Thyroid Function Tests: No results for input(s): "TSH", "T4TOTAL", "FREET4", "T3FREE", "THYROIDAB" in the last 72 hours. Anemia Panel: No results for input(s): "VITAMINB12", "FOLATE", "FERRITIN", "TIBC", "IRON", "RETICCTPCT" in the last 72 hours. Urine analysis:    Component Value Date/Time   COLORURINE YELLOW 05/23/2023 1519   APPEARANCEUR CLEAR 05/23/2023 1519   LABSPEC 1.013 05/23/2023 1519   PHURINE 6.0 05/23/2023 1519   GLUCOSEU NEGATIVE 05/23/2023 1519   HGBUR NEGATIVE 05/23/2023 1519   BILIRUBINUR NEGATIVE 05/23/2023 1519   KETONESUR NEGATIVE 05/23/2023 1519   PROTEINUR NEGATIVE 05/23/2023 1519   UROBILINOGEN 1.0 03/15/2015 1117   NITRITE NEGATIVE 05/23/2023 1519   LEUKOCYTESUR NEGATIVE 05/23/2023 1519    Radiological Exams on Admission: DG Chest Port 1 View Result Date: 02/15/2024 CLINICAL DATA:  Shortness of breath. EXAM: PORTABLE CHEST 1 VIEW COMPARISON:  December 18, 2023 FINDINGS: There is stable single lead ventricular pacer positioning. Multiple sternal wires and vascular clips are seen. A radiopaque stimulator and associated stimulator wire is noted. The heart size and mediastinal contours are within normal limits. There is no evidence of acute infiltrate or pneumothorax. A small right pleural effusion is noted. The visualized skeletal structures are unremarkable. IMPRESSION: 1. Evidence of prior median sternotomy/CABG. 2. Small right pleural effusion. Electronically Signed    By: Aram Candela M.D.   On: 02/15/2024 00:11    EKG: Independently reviewed.   Assessment/Plan  Acute on chronic combined diastolic and systolic heart failure exacerbation associated acute hypoxic respiratory failure -s/p AICD -ef 20-25% -currently on cpap in ed , will wean as able  -lasix 40 mg iv  bid  -Continue midodrine 5 mg tid.  -Continue Farxiga 10 mg daily. - Continue digoxin 0.125 mg daily. - Continue ivabradine 7.5 mg bid. - Continue spironolactone 25 mg daily.  -ekg without hyperacute st -twave changes  -patient has increase bnp above prior, does appear some what  euvolemic on exam however.  -monitor renal function closely   Probable Acute COPD exacerbation with acutehypoxic respiratory failure -admit to progressive care  - s/p solumedrol in ED, will continue with oral prednisone on admit - continued with doxycycline  -cxr w/o noted infiltrate -f/u on sputum cultures  -consider further imaging of chest if patient does not improve - nebs standing and prn  -resume chronic inhalers once med rec completed -pulmonary toilet  -wean O2 as able   CAD s/p CABG -on active complaints  - Continue atorvastatin  CKDIIIa/b -patient with stable renal function cr 1.2 at baseline   P Afib -note on Eliquis due to recurrent hemoptysis per cardiology noted 01/28/2024 -patient however documents she is still taking this medications , this will need to be clarified in am with cardiology. -continue on digoxin , check level  -continue ivabradine   Hx of SCLC -s/p tx , no active disease currenlty  OSA -cpap at bedtime   Hx of Hypertension, no with hypotension  -patient with borderline low bp  - continue on midodrine   HLD -resume statin   Hx of renal infarct  - not currently on OAC per cards   DVT prophylaxis: heparin Code Status:DNR/ as discussed per patient wishes in event of cardiac arrest Family Communication: none at bedside Disposition Plan: patient   expected to be admitted greater than 2 midnights Consults called: n/a Admission status: progressive care    Lurline Del MD [facility]   If 7PM-7AM, please contact night-coverage [email or web address] Password [facility]  02/15/2024, 12:34 AM

## 2024-02-15 NOTE — ED Notes (Signed)
 Patient requesting to be removed from CPAP. CPAP removed and patient placed on 2L [place]. Patient tolerating well. VSS. 96% on 2L .

## 2024-02-15 NOTE — Hospital Course (Addendum)
 [name] was admitted to the hospital with the working diagnosis of heart failure exacerbation.   63 yo female with the past medical history of COPD, paroxysmal atrial fibrillation, hypertension, coronary artery disease, hyperlipidemia, heart failure, and small cell lung cancer sp radiation and chemotherapy in 2015 who presented with dyspnea. Reported 2 days of progressive dyspnea. Severe symptoms that prompted her to call EMS. She was found hypoxemic and in respiratory distress, 02 sat 90%. She was placed on Cpap and transported to the ED.  On her initial physical examination her blood pressure was 107/70, HR 104, RR 24 and 02 saturation 100% on Cpap.  Lungs with decreased breath sounds bilaterally, with no wheezing or rhonchi, heart with S1 and S2 present and regular, no gallops or rubs, abdomen with no distention and no lower extremity edema.   Na 142, K 3.6 cl 104, bicarbonate 20 glucose 264, bun 27 cr 1.20 BNP 2,541  Lactic acid 2,5 and 1.5  Wbc 10.6 hgb 12.1 plt 202   Sars covid 19 negative  Influenza negative   Chest radiograph with hyperinflation, positive cardiomegaly, with small right pleural effusion, mild bilateral hilar vascular congestion. Defibrillator in place with lead in the RV. Right upper chest with neuro stimulator. Positive sternotomy wires in place.   EKG 110 bpm, left axis deviation, normal intervals (positive artifact), sinus rhythm with q waves lead I and aVL, with no significant ST segment or T wave changes.   03/08 overnight chest pain and worsening dyspnea.  03/09 volume status and oxygenation improving but continue to have dyspnea.  03./10 clinically improving, transition to po loop diuretics today and possible discharge home tomorrow.

## 2024-02-15 NOTE — Research (Signed)
 REBALANCE End of study visit for patient. Study has decided to end study after following subjects for 12 month instead of the 36 month visit. Went to the ed and informed patient that study will be closing and thanked her for her participation.   Study Termination 02/15/2024   Termination reason: Study Closure    [place] Aryahi Denzler, Research Coordinator 02/15/2024  08:45 am

## 2024-02-15 NOTE — Assessment & Plan Note (Addendum)
 Acute exacerbation.   Plan to continue systemic corticosteroids with prednisone and inhaled corticosteroids with budesonide Continue bronchodilator therapy with duoneb.  Incentive spirometer and flutter valve for airway clearing techniques.  Oxymetry monitoring and supplemental 02 per [place].

## 2024-02-16 ENCOUNTER — Inpatient Hospital Stay (HOSPITAL_COMMUNITY)

## 2024-02-16 DIAGNOSIS — I480 Paroxysmal atrial fibrillation: Secondary | ICD-10-CM

## 2024-02-16 DIAGNOSIS — I5023 Acute on chronic systolic (congestive) heart failure: Secondary | ICD-10-CM

## 2024-02-16 DIAGNOSIS — E785 Hyperlipidemia, unspecified: Secondary | ICD-10-CM

## 2024-02-16 DIAGNOSIS — N1832 Chronic kidney disease, stage 3b: Secondary | ICD-10-CM

## 2024-02-16 DIAGNOSIS — J439 Emphysema, unspecified: Secondary | ICD-10-CM

## 2024-02-16 DIAGNOSIS — I2510 Atherosclerotic heart disease of native coronary artery without angina pectoris: Secondary | ICD-10-CM

## 2024-02-16 DIAGNOSIS — I10 Essential (primary) hypertension: Secondary | ICD-10-CM

## 2024-02-16 DIAGNOSIS — E1169 Type 2 diabetes mellitus with other specified complication: Secondary | ICD-10-CM

## 2024-02-16 DIAGNOSIS — E43 Unspecified severe protein-calorie malnutrition: Secondary | ICD-10-CM

## 2024-02-16 LAB — BLOOD GAS, VENOUS
Acid-Base Excess: 3.2 mmol/L — ABNORMAL HIGH (ref 0.0–2.0)
Bicarbonate: 26.9 mmol/L (ref 20.0–28.0)
O2 Saturation: 100 %
Patient temperature: 37
pCO2, Ven: 37 mmHg — ABNORMAL LOW (ref 44–60)
pH, Ven: 7.47 — ABNORMAL HIGH (ref 7.25–7.43)
pO2, Ven: 117 mmHg — ABNORMAL HIGH (ref 32–45)

## 2024-02-16 LAB — CBC WITH DIFFERENTIAL/PLATELET
Abs Immature Granulocytes: 0.07 10*3/uL (ref 0.00–0.07)
Basophils Absolute: 0 10*3/uL (ref 0.0–0.1)
Basophils Relative: 0 %
Eosinophils Absolute: 0 10*3/uL (ref 0.0–0.5)
Eosinophils Relative: 0 %
HCT: 34.9 % — ABNORMAL LOW (ref 36.0–46.0)
Hemoglobin: 11.1 g/dL — ABNORMAL LOW (ref 12.0–15.0)
Immature Granulocytes: 1 %
Lymphocytes Relative: 4 %
Lymphs Abs: 0.5 10*3/uL — ABNORMAL LOW (ref 0.7–4.0)
MCH: 28.1 pg (ref 26.0–34.0)
MCHC: 31.8 g/dL (ref 30.0–36.0)
MCV: 88.4 fL (ref 80.0–100.0)
Monocytes Absolute: 0.8 10*3/uL (ref 0.1–1.0)
Monocytes Relative: 6 %
Neutro Abs: 10.9 10*3/uL — ABNORMAL HIGH (ref 1.7–7.7)
Neutrophils Relative %: 89 %
Platelets: 181 10*3/uL (ref 150–400)
RBC: 3.95 MIL/uL (ref 3.87–5.11)
RDW: 18 % — ABNORMAL HIGH (ref 11.5–15.5)
WBC: 12.3 10*3/uL — ABNORMAL HIGH (ref 4.0–10.5)
nRBC: 0 % (ref 0.0–0.2)

## 2024-02-16 LAB — BASIC METABOLIC PANEL
Anion gap: 15 (ref 5–15)
BUN: 38 mg/dL — ABNORMAL HIGH (ref 8–23)
CO2: 21 mmol/L — ABNORMAL LOW (ref 22–32)
Calcium: 9.8 mg/dL (ref 8.9–10.3)
Chloride: 100 mmol/L (ref 98–111)
Creatinine, Ser: 1.4 mg/dL — ABNORMAL HIGH (ref 0.44–1.00)
GFR, Estimated: 42 mL/min — ABNORMAL LOW (ref >60)
Glucose, Bld: 329 mg/dL — ABNORMAL HIGH (ref 70–99)
Potassium: 3.9 mmol/L (ref 3.5–5.1)
Sodium: 136 mmol/L (ref 135–145)

## 2024-02-16 LAB — HEMOGLOBIN A1C
Hgb A1c MFr Bld: 7.2 % — ABNORMAL HIGH (ref 4.8–5.6)
Mean Plasma Glucose: 159.94 mg/dL

## 2024-02-16 LAB — GLUCOSE, CAPILLARY
Glucose-Capillary: 452 mg/dL — ABNORMAL HIGH (ref 70–99)
Glucose-Capillary: 171 mg/dL — ABNORMAL HIGH (ref 70–99)

## 2024-02-16 LAB — TROPONIN I (HIGH SENSITIVITY)
Troponin I (High Sensitivity): 55 ng/L — ABNORMAL HIGH (ref <18)
Troponin I (High Sensitivity): 120 ng/L (ref <18)
Troponin I (High Sensitivity): 220 ng/L (ref <18)

## 2024-02-16 LAB — D-DIMER, QUANTITATIVE: D-Dimer, Quant: 1.48 ug{FEU}/mL — ABNORMAL HIGH (ref 0.00–0.50)

## 2024-02-16 LAB — MAGNESIUM: Magnesium: 2.1 mg/dL (ref 1.7–2.4)

## 2024-02-16 MED ORDER — LEVALBUTEROL HCL 1.25 MG/0.5ML IN NEBU
1.2500 mg | INHALATION_SOLUTION | Freq: Four times a day (QID) | RESPIRATORY_TRACT | Status: DC
  Administered 2024-02-16 – 2024-02-18 (×9): 1.25 mg via RESPIRATORY_TRACT
  Filled 2024-02-16 (×10): qty 0.5

## 2024-02-16 MED ORDER — LORAZEPAM 2 MG/ML IJ SOLN
0.5000 mg | Freq: Once | INTRAMUSCULAR | Status: AC
  Administered 2024-02-16: 0.5 mg via INTRAVENOUS

## 2024-02-16 MED ORDER — LEVALBUTEROL HCL 1.25 MG/0.5ML IN NEBU: 1.2500 mg | INHALATION_SOLUTION | Freq: Four times a day (QID) | RESPIRATORY_TRACT | Status: DC | PRN

## 2024-02-16 MED ORDER — APIXABAN 2.5 MG PO TABS: 2.5000 mg | ORAL_TABLET | Freq: Two times a day (BID) | ORAL | Status: DC

## 2024-02-16 MED ORDER — ADULT MULTIVITAMIN W/MINERALS CH
1.0000 | ORAL_TABLET | Freq: Every day | ORAL | Status: DC
Start: 2024-02-16 — End: 2024-02-19
  Administered 2024-02-16 – 2024-02-19 (×4): 1 via ORAL
  Filled 2024-02-16 (×3): qty 1

## 2024-02-16 MED ORDER — APIXABAN 5 MG PO TABS
5.0000 mg | ORAL_TABLET | Freq: Two times a day (BID) | ORAL | Status: DC
  Administered 2024-02-16 – 2024-02-18 (×5): 5 mg via ORAL
  Filled 2024-02-16 (×5): qty 1

## 2024-02-16 MED ORDER — ENSURE ENLIVE PO LIQD
237.0000 mL | Freq: Two times a day (BID) | ORAL | Status: DC
  Administered 2024-02-16 – 2024-02-19 (×6): 237 mL via ORAL

## 2024-02-16 MED ORDER — ALBUTEROL SULFATE (2.5 MG/3ML) 0.083% IN NEBU: 5.0000 mg | INHALATION_SOLUTION | Freq: Once | RESPIRATORY_TRACT | Status: DC

## 2024-02-16 MED ORDER — EMPAGLIFLOZIN 10 MG PO TABS
10.0000 mg | ORAL_TABLET | Freq: Every day | ORAL | Status: DC
  Administered 2024-02-16 – 2024-02-19 (×4): 10 mg via ORAL
  Filled 2024-02-16 (×5): qty 1

## 2024-02-16 MED ORDER — HYDROXYZINE HCL 25 MG PO TABS
25.0000 mg | ORAL_TABLET | Freq: Every day | ORAL | Status: DC | PRN
  Administered 2024-02-16 (×2): 25 mg via ORAL
  Filled 2024-02-16 (×2): qty 1

## 2024-02-16 MED ORDER — INSULIN ASPART 100 UNIT/ML IJ SOLN
3.0000 [IU] | Freq: Three times a day (TID) | INTRAMUSCULAR | Status: DC
  Administered 2024-02-17 – 2024-02-19 (×8): 3 [IU] via SUBCUTANEOUS

## 2024-02-16 MED ORDER — FUROSEMIDE 10 MG/ML IJ SOLN
40.0000 mg | Freq: Once | INTRAMUSCULAR | Status: AC
  Administered 2024-02-16: 40 mg via INTRAVENOUS
  Filled 2024-02-16: qty 4

## 2024-02-16 MED ORDER — LORAZEPAM 2 MG/ML IJ SOLN
1.0000 mg | Freq: Once | INTRAMUSCULAR | Status: DC | PRN
  Filled 2024-02-16: qty 1

## 2024-02-16 MED ORDER — ASPIRIN 81 MG PO TBEC
81.0000 mg | DELAYED_RELEASE_TABLET | Freq: Every day | ORAL | Status: DC
  Administered 2024-02-16 – 2024-02-19 (×4): 81 mg via ORAL
  Filled 2024-02-16 (×4): qty 1

## 2024-02-16 MED ORDER — LORAZEPAM 2 MG/ML IJ SOLN
1.0000 mg | Freq: Once | INTRAMUSCULAR | Status: AC
  Administered 2024-02-16: 1 mg via INTRAVENOUS
  Filled 2024-02-16: qty 1

## 2024-02-16 MED ORDER — INSULIN ASPART 100 UNIT/ML IJ SOLN
0.0000 [IU] | Freq: Three times a day (TID) | INTRAMUSCULAR | Status: DC
  Administered 2024-02-16: 9 [IU] via SUBCUTANEOUS
  Administered 2024-02-17: 3 [IU] via SUBCUTANEOUS
  Administered 2024-02-17: 2 [IU] via SUBCUTANEOUS
  Administered 2024-02-17: 1 [IU] via SUBCUTANEOUS
  Administered 2024-02-18: 3 [IU] via SUBCUTANEOUS
  Administered 2024-02-18: 2 [IU] via SUBCUTANEOUS
  Administered 2024-02-19: 1 [IU] via SUBCUTANEOUS
  Administered 2024-02-19: 3 [IU] via SUBCUTANEOUS

## 2024-02-16 MED ORDER — LEVALBUTEROL HCL 1.25 MG/0.5ML IN NEBU
INHALATION_SOLUTION | RESPIRATORY_TRACT | Status: AC
Start: 2024-02-16 — End: 2024-02-16
  Filled 2024-02-16: qty 0.5

## 2024-02-16 MED ORDER — METHYLPREDNISOLONE SODIUM SUCC 125 MG IJ SOLR
60.0000 mg | Freq: Once | INTRAMUSCULAR | Status: AC
  Administered 2024-02-16: 60 mg via INTRAVENOUS
  Filled 2024-02-16: qty 2

## 2024-02-16 NOTE — Progress Notes (Signed)
 Initial Nutrition Assessment  DOCUMENTATION CODES:   Not applicable  INTERVENTION:  Liberalize diet to regular to promote adequate PO intake Ensure Enlive po BID, each supplement provides 350 kcal and 20 grams of protein. MVI with minerals daily  NUTRITION DIAGNOSIS:   Increased nutrient needs related to chronic illness (COPD) as evidenced by estimated needs.  GOAL:   Patient will meet greater than or equal to 90% of their needs  MONITOR:   PO intake, Supplement acceptance, Labs, Weight trends, I & O's  REASON FOR ASSESSMENT:   Consult Assessment of nutrition requirement/status  ASSESSMENT:   Pt admitted with heart failure exacerbation. PMH significant for COPD, paroxysmal afib, HTN, CAD, HLD, HF, small cell lung cancer s/p radiation and chemo (2015).  RD working remotely. Unsuccessful attempt to reach pt via phone call to room with busy tone.   Unable to obtain updated nutrition information at this time. Pt assessed by RD team during prior admission and has been diagnosed with malnutrition. During last admission in January, pt had endorsed poor appetite for months. Question whether this has been an ongoing complication for pt. Diet has been liberalized and nutrition supplements added to augment PO intake.   One meal completion documented to be 0% on 3/7.  No significant weight loss noted upon review of weight history however it appears her weight has increased within the last 3 weeks from 42.8 kg to 47.7 kg. No noted edema on assessment however suspected to be volume overloaded.   Medications: jardiance, lasix 80mg  every 12 hours, megace, prednisone  Labs:  BUN 38 Cr 1.40  NUTRITION - FOCUSED PHYSICAL EXAM: RD working remotely. Deferred to follow up.   Diet Order:   Diet Order             Diet regular Room service appropriate? Yes; Fluid consistency: Thin  Diet effective now                   EDUCATION NEEDS:   No education needs have been identified  at this time  Skin:  Skin Assessment: Reviewed RN Assessment  Last BM:  3/6  Height:   Ht Readings from Last 1 Encounters:  02/15/24 4' 11.5" (1.511 m)    Weight:   Wt Readings from Last 1 Encounters:  02/16/24 47.7 kg   BMI:  Body mass index is 20.87 kg/m.  Estimated Nutritional Needs:   Kcal:  1300-1500  Protein:  65-75g  Fluid:  1.3-1.5L  Brittany [name], RDN, LDN Clinical Nutrition

## 2024-02-16 NOTE — Progress Notes (Signed)
 6063K- Seen patient on high back rest, with complained of shortness of breath, nauseated and having not enough oxygen from BIPAP. Upon assessment, patient has wheezing and HR at 130's, tachypneic with RR 30, assisted to comfortable position, rechecked BP called respiratory therapist for breathing treatment and to check the BIPAP. Patient is very anxious but able to verbalized discomfort and needs, and follows commands. Breathing treatment administered and BIPAP settings was checked by Respiratory Therapist. Informed Dr. Joneen Roach about the events, with orders made and carried out.   0140H-Dr. Crosley came and assessed the patient with orders made and carried out. Ativan 1mg  and Lasix 40mg  IV given. Stat lab blood exams and Chest X-ray facilitated.   1601U- Rechecked vital signs, additional 0.5mg  of Ativan given as ordered. EKG cannot completed due to muscle tremors informed Dr. Joneen Roach. Kept patient safe and comfortable. Monitor for signs of respiratory distress.

## 2024-02-16 NOTE — Progress Notes (Addendum)
 Progress Note   Patient: Brittany [name] ZOX:[id number] DOB: [birth year] DOA: 02/14/2024     1 DOS: the patient was seen and examined on 02/16/2024   Brief hospital course: [name] was admitted to the hospital with the working diagnosis of heart failure exacerbation.   63 yo female with the past medical history of COPD, paroxysmal atrial fibrillation, hypertension, coronary artery disease, hyperlipidemia, heart failure, and small cell lung cancer sp radiation and chemotherapy in 2015 who presented with dyspnea. Reported 2 days of progressive dyspnea. Severe symptoms that prompted her to call EMS. She was found hypoxemic and in respiratory distress, 02 sat 90%. She was placed on Cpap and transported to the ED.  On her initial physical examination her blood pressure was 107/70, HR 104, RR 24 and 02 saturation 100% on Cpap.  Lungs with decreased breath sounds bilaterally, with no wheezing or rhonchi, heart with S1 and S2 present and regular, no gallops or rubs, abdomen with no distention and no lower extremity edema.   Na 142, K 3.6 cl 104, bicarbonate 20 glucose 264, bun 27 cr 1.20 BNP 2,541  Lactic acid 2,5 and 1.5  Wbc 10.6 hgb 12.1 plt 202   Sars covid 19 negative  Influenza negative   Chest radiograph with hyperinflation, positive cardiomegaly, with small right pleural effusion, mild bilateral hilar vascular congestion. Defibrillator in place with lead in the RV. Right upper chest with neuro stimulator. Positive sternotomy wires in place.   EKG 110 bpm, left axis deviation, normal intervals (positive artifact), sinus rhythm with q waves lead I and aVL, with no significant ST segment or T wave changes.   03/08 overnight chest pain and worsening dyspnea.   Assessment and Plan: * Acute on chronic systolic CHF (congestive heart failure) (HCC) Echocardiogram with decreases systolic function EF <20%, with global hypokinesis, LV cavity dilatated, RV with moderate to severe  reduction in systolic function, LA and RA with normal size, no significant valvular disease.   Biventricular failure with poor prognosis.   Urine output documented at 600 cc  Documented weigh is up 3 Kg.   Continue diuresis with furosemide 80 mg IV bid.  Spironolactone.  Continue blood pressure monitoring. Continue ivabradin, digoxin and midodrine. Resume SGLT 2 inh   Acute on chronic hypoxemic respiratory failure due to acute cardiogenic pulmonary edema and COPD exacerbation.  Continue bronchodilator therapy, steroids and diuresis.  Continue oxymetry monitoring, and supplemental 02 to keep 02 saturation 88% or greater.   PAF (paroxysmal atrial fibrillation) (HCC) Continue rate control with digoxin and ivabradine.  Anticoagulation with apixaban.   CAD (coronary artery disease) CABG  Chest pain last night.  High sensitive troponin elevation from 120 to 220  Follow up EKG sinus rhythm with positive artifact but no sings of ST segment or T wave changes.  Her chest pain has improved this morning.   Last cardiac catheterization in 2020 with significant underlying 2 vessel coronary artery disease with patent SVG to OM and SVG to diagonal. Had drug eluding stent placed to ostial ramus extending into the distal left main.   Continue statin, to consider adding aspirin.    COPD (chronic obstructive pulmonary disease) (HCC) Acute exacerbation.   Plan to continue systemic corticosteroids with prednisone and inhaled corticosteroids with budesonide Continue bronchodilator therapy with duoneb.  Incentive spirometer and flutter valve for airway clearing techniques.  Oxymetry monitoring and supplemental 02 per [place].   CKD stage 3b, GFR 30-44 ml/min (HCC) Renal function with serum cr at 1,40 with  K at 3,9 and serum bicarbonate at 21  Na 136 and Mg 2.1   Plan to continue diuresis with furosemide Follow up renal function and electrolytes in am.   Essential hypertension Continue blood  pressure support with midodrine. Continue diuresis with furosemide and spironolactone.   Type 2 diabetes mellitus with hyperlipidemia (HCC) Uncontrolled with hyperglycemia.  Add insulin sliding scale.   Continue with statin therapy.   Protein-calorie malnutrition, severe (HCC) Continue nutritional supplements.         Subjective: Last night patient had chest pain and worsening dyspnea, she had difficulty using bipap. This morning continue to have dyspnea but chest pain has improved.   Physical Exam: Vitals:   02/16/24 0313 02/16/24 0420 02/16/24 0458 02/16/24 0729  BP:  125/79    Pulse: 93 (!) 105    Resp: 14 17    Temp:  97.9 F (36.6 C)    TempSrc:  Axillary    SpO2: 100% 100%  100%  Weight:   47.7 kg   Height:       Neurology awake and alert ENT with mild pallor Cardiovascular with S1 and S2 present and regular, positive systolic murmur at the right lower sternal border.  Respiratory with prolonged expiratory phase, positive expiratory wheezing and bilateral rhonchi. Scattered rales Abdomen with no distention.  Data Reviewed:    Family Communication: no family at the bedside   Disposition: Status is: Inpatient Remains inpatient appropriate because: IV diuresis   Planned Discharge Destination: Home    Author: Coralie Keens, MD 02/16/2024 10:15 AM  For on call review [email or web address].

## 2024-02-16 NOTE — Consult Note (Addendum)
 Cardiology Consultation   Patient ID: Brittany [name] MRN: [id number]; DOB: August 01, 1961  Admit date: 02/14/2024 Date of Consult: 02/16/2024  PCP:  Georganna Skeans, MD   [facility] Providers Cardiologist:  None  Electrophysiologist:  Lanier Prude, MD  Advanced Heart Failure:  Arvilla Meres, MD       Patient Profile:   Brittany [name] is a 63 y.o. female with a hx of PAF, former smoker for 30 years quitting in 2015, COPD, hypertension, hyperlipidemia, CAD s/p CABG 01/30/2018, biventricular failure s/p single-chamber ICD and Barostim, history of lung cancer s/p chemoradiation therapy and prophylactic brain radiation in 2015 who is being seen 02/16/2024 for the evaluation of acute CHF and elevated troponin at the request of Dr. Ella Jubilee.  History of Present Illness:   Brittany [name] is a 63 year old female with past medical history of PAF, former smoker for 30 years quitting in 2015, COPD, hypertension, hyperlipidemia, CAD s/p CABG 01/30/2018, biventricular failure s/p single-chamber ICD and Barostim, history of lung cancer s/p chemoradiation therapy and prophylactic brain radiation in 2015.  Patient was admitted with NSTEMI and shock in February 2019, emergent cardiac catheterization at the time showed 100% LAD occlusion, 95% left circumflex stenosis.  She was taken for emergent CABG on 02/03/2018.  She required Impella postop.  Hospital course complicated by hospital-acquired pneumonia, A-fib, respiratory failure, and cardiogenic shock.  She had multiple hospitalization for heart failure and pleural effusion in 2019, and eventually underwent pleurodesis at [facility]  She ultimately underwent [employer] single-chamber ICD implantation in 2019 for chronic systolic heart failure despite medical therapy.  She was admitted with NSTEMI and heart failure in March 2020 and received DES to ostial ramus into distal left main.  She underwent IV diuresis at the time.   Echocardiogram showed EF 25 to 30%.  EF dropped further to less than 20% by March 2022 during hospitalization for COPD exacerbation.  She was admitted in December 2022 for right renal infarct felt to be cardioembolic even though no A-fib was found at the time.  She was started on Eliquis.  She was readmitted in August 2023 with CHF and acute respiratory failure secondary to low output heart failure versus pneumonia versus COPD exacerbation.  She was treated with milrinone during the hospitalization and underwent IV diuresis.  Echocardiogram at the time showed EF 20 to 25%, mild MR, mild to moderate AI.  Barostim was placed on 04/13/2022.  Patient was admitted in May 2024, August 2024, and October 2020 for was heart failure.  EF remain low at 20 to 25%.  Patient is DNR.  She was readmitted in January 2025 with COPD exacerbation, heart failure and RSV.  She required BiPAP.  Echocardiogram showed EF less than 20%, moderate to severely reduced RVEF.  Since discharge, she was seen by heart failure service on 01/09/2024.  She was on midodrine, metolazone, torsemide, Farxiga, digoxin, ivabradine and spironolactone.  She was felt not to be a candidate for advanced therapy due to severity of the lung disease.  Although office note mention she is supposed to take torsemide 80 mg twice a day and 40 mEq daily of potassium supplement, according to the patient, she has been taking 40 mg twice a day of torsemide instead.  She has been having worsening shortness of breath since Tuesday, 02/12/2024.  On Wednesday, she did not eat much due to loss of appetite.  She ultimately sought medical attention at [facility] on 02/14/2024 due to worsening shortness of breath.  BNP was  2541, whereas it was 902 a month ago.  Creatinine on arrival 1.20.  White blood cell count 10.6, hemoglobin 12.1.  Lactic acid 2.5--> 1.5.  During the hospitalization, patient complained of occasional chest discomfort that last from a few seconds up to a  minute.  She says she has been experiencing this at home as well when she tried to lift heavy object.  Serial troponin 55--> 120--> 220.  EKG showed sinus tachycardia.  Looking back, her serial troponin has been trending up to 400 during the previous hospitalization for heart failure and COPD exacerbation.  On physical exam, she has rales in bilateral lower lung and diffuse rhonchi.  Viral panel negative.  Chest x-ray showed evidence of sternotomy and CABG, small right pleural effusion, progressive trace interstitial pulmonary edema.  Weight is not accurate.  Cardiology service consulted for heart failure and elevation of the troponin.   Past Medical History:  Diagnosis Date   Acute respiratory failure (HCC) 05/05/2018   Acute systolic congestive heart failure (HCC) 02/03/2018   AICD (automatic cardioverter/defibrillator) present    Allergy    Anxiety    Asthma    DM2 (diabetes mellitus, type 2) (HCC) 10/20/2020   Hypertension    PAF (paroxysmal atrial fibrillation) (HCC)    Presence of permanent cardiac pacemaker    Prophylactic measure 08/03/14-08/19/14   Prophyl. 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   LIMA to LAD, SVG to D1, SVG to OM1, EVH via right thigh with implantation of Impella LD LVAD via direct aortic approach   Small cell lung cancer (HCC) 03/16/2014    Past Surgical History:  Procedure Laterality Date   BRONCHIAL BRUSHINGS  10/25/2020   Procedure: BRONCHIAL BRUSHINGS;  Surgeon: Leslye Peer, MD;  Location: [facility] ENDOSCOPY;  Service: Pulmonary;;   BRONCHIAL NEEDLE ASPIRATION BIOPSY  10/25/2020   Procedure: BRONCHIAL NEEDLE ASPIRATION BIOPSIES;  Surgeon: Leslye Peer, MD;  Location: [facility] ENDOSCOPY;  Service: Pulmonary;;   CARDIAC DEFIBRILLATOR PLACEMENT  08/15/2018   MDT Visia AF MRI VR ICD implanted by Dr Georgena Spurling for primary prevention of sudden   CESAREAN SECTION     CORONARY ARTERY BYPASS GRAFT N/A 02/03/2018   Procedure: CORONARY ARTERY BYPASS  GRAFTING (CABG);  Surgeon: Purcell Nails, MD;  Location: [facility] OR;  Service: Open Heart Surgery;  Laterality: N/A;  Time 3 using left internal mammary artery and endoscopically harvested right saphenous vein   CORONARY BALLOON ANGIOPLASTY N/A 02/03/2018   Procedure: CORONARY BALLOON ANGIOPLASTY;  Surgeon: Swaziland, Peter M, MD;  Location: [facility] INVASIVE CV LAB;  Service: Cardiovascular;  Laterality: N/A;   CORONARY STENT INTERVENTION N/A 02/12/2019   Procedure: CORONARY STENT INTERVENTION;  Surgeon: Iran Ouch, MD;  Location: [facility] INVASIVE CV LAB;  Service: Cardiovascular;  Laterality: N/A;   CORONARY/GRAFT ACUTE MI REVASCULARIZATION N/A 02/03/2018   Procedure: Coronary/Graft Acute MI Revascularization;  Surgeon: Swaziland, Peter M, MD;  Location: [facility] INVASIVE CV LAB;  Service: Cardiovascular;  Laterality: N/A;   ENDOBRONCHIAL ULTRASOUND N/A 10/25/2020   Procedure: ENDOBRONCHIAL ULTRASOUND;  Surgeon: Leslye Peer, MD;  Location: [facility] ENDOSCOPY;  Service: Pulmonary;  Laterality: N/A;   FLEXIBLE BRONCHOSCOPY  10/25/2020   Procedure: FLEXIBLE BRONCHOSCOPY;  Surgeon: Leslye Peer, MD;  Location: [facility] ENDOSCOPY;  Service: Pulmonary;;   IABP INSERTION N/A 02/03/2018   Procedure: IABP Insertion;  Surgeon: Swaziland, Peter M, MD;  Location: [facility] INVASIVE CV LAB;  Service: Cardiovascular;  Laterality: N/A;   INTRAOPERATIVE TRANSESOPHAGEAL ECHOCARDIOGRAM N/A 02/03/2018  Procedure: INTRAOPERATIVE TRANSESOPHAGEAL ECHOCARDIOGRAM;  Surgeon: Purcell Nails, MD;  Location: [facility] OR;  Service: Open Heart Surgery;  Laterality: N/A;   LEFT HEART CATH AND CORONARY ANGIOGRAPHY N/A 02/03/2018   Procedure: LEFT HEART CATH AND CORONARY ANGIOGRAPHY;  Surgeon: Swaziland, Peter M, MD;  Location: [facility] INVASIVE CV LAB;  Service: Cardiovascular;  Laterality: N/A;   LEFT HEART CATH AND CORS/GRAFTS ANGIOGRAPHY N/A 02/12/2019   Procedure: LEFT HEART CATH AND CORS/GRAFTS ANGIOGRAPHY;  Surgeon: Iran Ouch, MD;  Location: [facility] INVASIVE CV LAB;   Service: Cardiovascular;  Laterality: N/A;   MEDIASTINOSCOPY N/A 03/11/2014   Procedure: MEDIASTINOSCOPY;  Surgeon: Loreli Slot, MD;  Location: [facility] OR;  Service: Thoracic;  Laterality: N/A;   PLACEMENT OF IMPELLA LEFT VENTRICULAR ASSIST DEVICE  02/03/2018   Procedure: PLACEMENT OF IMPELLA LEFT VENTRICULAR ASSIST DEVICE LD;  Surgeon: Purcell Nails, MD;  Location: [facility] OR;  Service: Open Heart Surgery;;   REMOVAL OF IMPELLA LEFT VENTRICULAR ASSIST DEVICE N/A 02/08/2018   Procedure: REMOVAL OF IMPELLA LEFT VENTRICULAR ASSIST DEVICE;  Surgeon: Purcell Nails, MD;  Location: [facility] OR;  Service: Open Heart Surgery;  Laterality: N/A;   RIGHT HEART CATH N/A 02/03/2018   Procedure: RIGHT HEART CATH;  Surgeon: Swaziland, Peter M, MD;  Location: [facility] INVASIVE CV LAB;  Service: Cardiovascular;  Laterality: N/A;   RIGHT HEART CATH N/A 05/09/2018   Procedure: RIGHT HEART CATH;  Surgeon: Dolores Patty, MD;  Location: [facility] INVASIVE CV LAB;  Service: Cardiovascular;  Laterality: N/A;   TEE WITHOUT CARDIOVERSION N/A 02/08/2018   Procedure: TRANSESOPHAGEAL ECHOCARDIOGRAM (TEE);  Surgeon: Purcell Nails, MD;  Location: [facility] OR;  Service: Open Heart Surgery;  Laterality: N/A;   TUBAL LIGATION     VIDEO BRONCHOSCOPY WITH ENDOBRONCHIAL ULTRASOUND N/A 03/11/2014   Procedure: VIDEO BRONCHOSCOPY WITH ENDOBRONCHIAL ULTRASOUND;  Surgeon: Loreli Slot, MD;  Location: [facility] OR;  Service: Thoracic;  Laterality: N/A;     Home Medications:  Prior to Admission medications   Medication Sig Start Date End Date Taking? Authorizing Provider  albuterol (VENTOLIN HFA) 108 (90 Base) MCG/ACT inhaler Inhale 2 puffs into the lungs every 6 (six) hours as needed for wheezing or shortness of breath. 06/20/22  Yes Zannie Cove, MD  atorvastatin (LIPITOR) 80 MG tablet Take 1 tablet (80 mg total) by mouth daily. 08/06/23 11/20/24 Yes Lockie Mola, MD  CORLANOR 7.5 MG TABS tablet TAKE 1 TABLET BY MOUTH TWICE A DAY WITH FOOD 01/18/24  Yes  Bensimhon, Bevelyn Buckles, MD  cyclobenzaprine (FLEXERIL) 10 MG tablet Take 1 tablet (10 mg total) by mouth 3 (three) times daily as needed for muscle spasms. Patient taking differently: Take 0.5 mg by mouth daily as needed for muscle spasms. 10/11/23  Yes Georganna Skeans, MD  digoxin (LANOXIN) 0.125 MG tablet TAKE 1 TABLET BY MOUTH DAILY Patient taking differently: Take 125 mcg by mouth at bedtime. 04/23/23  Yes [name], Anderson Malta, FNP  ELIQUIS 2.5 MG TABS tablet TAKE 1 TABLET BY MOUTH TWICE A DAY 12/07/23  Yes Bensimhon, Bevelyn Buckles, MD  FARXIGA 10 MG TABS tablet TAKE 1 TABLET BY MOUTH EVERY DAY 11/21/23  Yes Bensimhon, Bevelyn Buckles, MD  Fluticasone-Umeclidin-Vilant (TRELEGY ELLIPTA) 200-62.5-25 MCG/ACT AEPB Inhale 1 puff into the lungs daily. 08/16/23  Yes Olalere, Adewale A, MD  hydrOXYzine (VISTARIL) 25 MG capsule TAKE 1 CAPSULE (25 MG TOTAL) BY MOUTH DAILY AS NEEDED. 07/30/23  Yes Zonia Kief, Amy J, NP  ipratropium-albuterol (DUONEB) 0.5-2.5 (3) MG/3ML SOLN Take 3 mLs by nebulization  every 4 (four) hours as needed (for shortness of breath). 01/15/24  Yes Georganna Skeans, MD  megestrol (MEGACE) 20 MG tablet Take 1 tablet (20 mg total) by mouth 2 (two) times daily. 01/15/24  Yes Georganna Skeans, MD  metolazone (ZAROXOLYN) 2.5 MG tablet Take 1 tablet (2.5 mg total) by mouth once a week. on Tuesday starting 01/15/2024 with 40 mEq of Potassium 3 times per day on metolazone days 01/14/24  Yes [name], Anderson Malta, FNP  nitroGLYCERIN (NITROSTAT) 0.4 MG SL tablet Place 1 tablet (0.4 mg total) under the tongue every 5 (five) minutes as needed for chest pain. 02/14/19  Yes Graciella Freer, PA-C  Nutritional Supplements (ENSURE ORIGINAL) LIQD Take 1 Bottle by mouth 2 (two) times daily after a meal. Drink 1 bottle 2 times daily.   Yes [provider]  potassium chloride (KLOR-CON) 20 MEQ packet Take 40 mEq by mouth once a week. TAKE ONLY ON TUESDAY:  Dissolve two ( ) packets in 4-6oz of fluid three times a day when  taking Metolazone 2.5mg .   Yes [provider]  spironolactone (ALDACTONE) 25 MG tablet Take 1 tablet (25 mg total) by mouth at bedtime. 12/22/23 03/21/24 Yes Ghimire, Werner Lean, MD  torsemide (DEMADEX) 20 MG tablet Take 4 tablets (80 mg total) by mouth 2 (two) times daily. 01/14/24  Yes [name], Anderson Malta, FNP  midodrine (PROAMATINE) 5 MG tablet Take 1 tablet (5 mg total) by mouth 3 (three) times daily with meals. Patient not taking: Reported on 02/15/2024 12/22/23   Maretta Bees, MD    Inpatient Medications: Scheduled Meds:  atorvastatin  80 mg Oral Daily   budesonide (PULMICORT) nebulizer solution  0.25 mg Nebulization BID   digoxin  125 mcg Oral Daily   furosemide  80 mg Intravenous Q12H   ivabradine  7.5 mg Oral BID WC   levalbuterol  1.25 mg Nebulization Q6H   megestrol  20 mg Oral BID   midodrine  2.5 mg Oral TID WC   predniSONE  40 mg Oral Q breakfast   spironolactone  25 mg Oral QHS   Continuous Infusions:  PRN Meds: acetaminophen **OR** acetaminophen, hydrOXYzine, LORazepam  Allergies:    Allergies  Allergen Reactions   Lactose Intolerance (Gi) Diarrhea   Codeine Nausea And Vomiting    Social History:   Social History   Socioeconomic History   Marital status: Married    Spouse name: Tristan Schroeder    Number of children: 1   Years of education: Not on file   Highest education level: Not on file  Occupational History   Occupation: disabled  Tobacco Use   Smoking status: Former    Current packs/day: 0.00    Average packs/day: 1 pack/day for 20.0 years (20.0 ttl pk-yrs)    Types: Cigarettes    Start date: 03/13/1994    Quit date: 03/13/2014    Years since quitting: 9.9   Smokeless tobacco: Never  Vaping Use   Vaping status: Never Used  Substance and Sexual Activity   Alcohol use: Yes    Alcohol/week: 5.0 standard drinks of alcohol    Types: 5 Glasses of wine per week    Comment: a few glasses of wine weekly   Drug use: Yes    Types: Marijuana     Comment: "medicinal", not daily   Sexual activity: Never  Other Topics Concern   Not on file  Social History Narrative   Not on file   Social Drivers of Health   Financial  Resource Strain: Medium Risk (02/11/2021)   Overall Financial Resource Strain (CARDIA)    Difficulty of Paying Living Expenses: Somewhat hard  Food Insecurity: No Food Insecurity (02/15/2024)   Hunger Vital Sign    Worried About Running Out of Food in the Last Year: Never true    Ran Out of Food in the Last Year: Never true  Transportation Needs: No Transportation Needs (02/15/2024)   PRAPARE - Administrator, Civil Service (Medical): No    Lack of Transportation (Non-Medical): No  Recent Concern: Transportation Needs - Unmet Transportation Needs (12/22/2023)   PRAPARE - Transportation    Lack of Transportation (Medical): Yes    Lack of Transportation (Non-Medical): Yes  Physical Activity: Insufficiently Active (02/11/2021)   Exercise Vital Sign    Days of Exercise per Week: 2 days    Minutes of Exercise per Session: 10 min  Stress: Stress Concern Present (02/11/2021)   [employer] of Occupational Health - Occupational Stress Questionnaire    Feeling of Stress : To some extent  Social Connections: Unknown (02/11/2021)   Social Connection and Isolation Panel [NHANES]    Frequency of Communication with Friends and Family: More than three times a week    Frequency of Social Gatherings with Friends and Family: Twice a week    Attends Religious Services: Not on Marketing executive or Organizations: Not on file    Attends Banker Meetings: Never    Marital Status: Married  Catering manager Violence: Not At Risk (02/15/2024)   Humiliation, Afraid, Rape, and Kick questionnaire    Fear of Current or Ex-Partner: No    Emotionally Abused: No    Physically Abused: No    Sexually Abused: No    Family History:    Family History  Problem Relation Age of Onset   Heart disease Mother     Hypertension Mother    Heart attack Mother    Hypertension Maternal Grandmother    Cancer Maternal Grandmother    Diabetes Paternal Grandmother    Stroke Neg Hx      ROS:  Please see the history of present illness.   All other ROS reviewed and negative.     Physical Exam/Data:   Vitals:   02/16/24 0313 02/16/24 0420 02/16/24 0458 02/16/24 0729  BP:  125/79    Pulse: 93 (!) 105    Resp: 14 17    Temp:  97.9 F (36.6 C)    TempSrc:  Axillary    SpO2: 100% 100%  100%  Weight:   47.7 kg   Height:        Intake/Output Summary (Last 24 hours) at 02/16/2024 0931 Last data filed at 02/16/2024 0459 Gross per 24 hour  Intake 240 ml  Output 600 ml  Net -360 ml      02/16/2024    4:58 AM 02/15/2024    2:56 PM 01/28/2024   12:20 PM  Last 3 Weights  Weight (lbs) 105 lb 1.6 oz 97 lb 10.6 oz 94 lb 6.4 oz  Weight (kg) 47.673 kg 44.3 kg 42.82 kg     Body mass index is 20.87 kg/m.  General:  Well nourished, well developed, in no acute distress HEENT: normal Neck: no JVD Vascular: No carotid bruits; Distal pulses 2+ bilaterally Cardiac: Mild tachycardic Lungs: Bibasilar rales, significant bilateral rhonchi throughout the lung and wheezing. Abd: soft, nontender, no hepatomegaly  Ext: no edema Musculoskeletal:  No deformities, BUE and BLE strength  normal and equal Skin: warm and dry  Neuro:  CNs 2-12 intact, no focal abnormalities noted Psych:  Normal affect   EKG:  The EKG was personally reviewed and demonstrates: Mild sinus tachycardia, electrical interference due to Barostim, unable to see ST-T wave changes Telemetry:  Telemetry was personally reviewed and demonstrates: Mild sinus tachycardia, no evidence of A-fib or ventricular ectopy  Relevant CV Studies:  Limited Echo 12/15/2023  1. Left ventricular ejection fraction, by estimation, is <20%. The left  ventricle has severely decreased function. The left ventricle demonstrates  global hypokinesis. The left ventricular internal  cavity size was dilated.  Left ventricular diastolic  parameters are consistent with Grade II diastolic dysfunction  (pseudonormalization).   2. Right ventricular systolic function moderate to severely reduced. The  right ventricular size is normal.   3. Left atrial size was grossly normal.   4. Right atrial size was grossly normal.   5. The mitral valve is grossly normal. Mild mitral valve regurgitation.  No evidence of mitral stenosis.   6. The aortic valve was not well visualized. Aortic valve regurgitation  is moderate. Aortic valve sclerosis is present, with no evidence of aortic  valve stenosis.   7. The inferior vena cava is normal in size with greater than 50%  respiratory variability, suggesting right atrial pressure of 3 mmHg.   Comparison(s): A prior study was performed on 09/26/2023. LVEF 20-25%,  global hypokinesis, moderately dilated ventricle, grade 2 diastolic  dysfunction, RVSP 36 mmHg, see report for additional details.   Conclusion(s)/Recommendation(s): No left ventricular mural or apical  thrombus/thrombi.    Laboratory Data:  High Sensitivity Troponin:   Recent Labs  Lab 02/16/24 0234 02/16/24 0417 02/16/24 0744  TROPONINIHS 55* 120* 220*     Chemistry Recent Labs  Lab 02/14/24 2132 02/14/24 2210 02/15/24 1540 02/16/24 0234  NA 142 137 135 136  K 3.6 5.6* 4.1 3.9  CL 104  --  98 100  CO2 20*  --  23 21*  GLUCOSE 264*  --  294* 329*  BUN 27*  --  31* 38*  CREATININE 1.20*  --  1.36* 1.40*  CALCIUM 9.7  --  9.9 9.8  MG  --   --   --  2.1  GFRNONAA 51*  --  44* 42*  ANIONGAP 18*  --  14 15    No results for input(s): "PROT", "ALBUMIN", "AST", "ALT", "ALKPHOS", "BILITOT" in the last 168 hours. Lipids No results for input(s): "CHOL", "TRIG", "HDL", "LABVLDL", "LDLCALC", "CHOLHDL" in the last 168 hours.  Hematology Recent Labs  Lab 02/14/24 2132 02/14/24 2210 02/15/24 1540 02/16/24 0234  WBC 10.6*  --  8.1 12.3*  RBC 4.26  --  4.16 3.95   HGB 12.1 13.3 11.7* 11.1*  HCT 38.9 39.0 36.5 34.9*  MCV 91.3  --  87.7 88.4  MCH 28.4  --  28.1 28.1  MCHC 31.1  --  32.1 31.8  RDW 18.6*  --  18.4* 18.0*  PLT 202  --  184 181   Thyroid No results for input(s): "TSH", "FREET4" in the last 168 hours.  BNP Recent Labs  Lab 02/14/24 2132  BNP 2,541.1*    DDimer  Recent Labs  Lab 02/16/24 0234  DDIMER 1.48*     Radiology/Studies:  DG CHEST PORT 1 VIEW Result Date: 02/16/2024 CLINICAL DATA:  Dyspnea EXAM: PORTABLE CHEST 1 VIEW COMPARISON:  02/14/2024 FINDINGS: The lungs are symmetrically, mildly hyperinflated in keeping with changes of underlying COPD. Cicatricial  changes within the right hilar region correspond to post radiation changes better appreciated on prior CT examination 02/15/2024. Developing interstitial pulmonary infiltrate, however, likely reflects progressive, trace interstitial pulmonary edema. No pneumothorax. Trace right pleural effusion is unchanged. Coronary artery bypass grafting has been performed. Stable cardiomegaly. Left subclavian single lead pacemaker defibrillator is unchanged. Neurostimulator battery pack overlies the right hemithorax. No acute bone abnormality. IMPRESSION: 1. Progressive, trace interstitial pulmonary edema. 2. Stable cardiomegaly. 3. COPD. 4. Post radiation changes within right hilum, better appreciated on prior CT examination of 02/15/2024. Electronically Signed   By: Helyn Numbers M.D.   On: 02/16/2024 02:53   CT CHEST WO CONTRAST Result Date: 02/15/2024 CLINICAL DATA:  COPD exacerbation. EXAM: CT CHEST WITHOUT CONTRAST TECHNIQUE: Multidetector CT imaging of the chest was performed following the standard protocol without IV contrast. RADIATION DOSE REDUCTION: This exam was performed according to the departmental dose-optimization program which includes automated exposure control, adjustment of the mA and/or kV according to patient size and/or use of iterative reconstruction technique.  COMPARISON:  05/02/2023 FINDINGS: Cardiovascular: The heart is enlarged. No substantial pericardial effusion. Status post CABG. Coronary artery calcification is evident. Mild atherosclerotic calcification is noted in the wall of the thoracic aorta. Left-sided pacemaker/AICD noted. Mediastinum/Nodes: 2.5 cm right thyroid nodule evident. No mediastinal lymphadenopathy. Soft tissue fullness noted right hilar region, not well evaluated on noncontrast imaging but similar to prior. The esophagus has normal imaging features. There is no axillary lymphadenopathy. Lungs/Pleura: Centrilobular and paraseptal emphysema evident. Similar right middle lobe collapse with abnormal confluent amorphous soft tissue in the right hilar region encasing upper lobe and middle lobe bronchi as well as some involvement of the central right lower lobe airways. Imaging features are similar to prior stated he. 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 No new focal airspace consolidation. No pulmonary edema or pleural effusion. Upper Abdomen: Visualized portion of the upper abdomen shows no acute findings. Stomach is distended with food and gas. Musculoskeletal: No worrisome lytic or sclerotic osseous abnormality. IMPRESSION: 1. No pulmonary edema or new focal lung consolidation. 2. Presumed post radiation scarring in the right hilar region. Mass-effect on central airways to the right upper and lower lobes is more pronounced than on the previous study. Close follow-up recommended and PET-CT may be warranted as recurrence cannot be excluded. 3. 2.5 cm right thyroid nodule. Recommend thyroid US (ref: J Am Coll Radiol. 2015 Feb;12(2): 143-50). 4.  Emphysema (ICD10-J43.9) and Aortic Atherosclerosis (ICD10-170.0) Electronically Signed    By: Kennith Center M.D.   On: 02/15/2024 07:16   DG Chest Port 1 View Result Date: 02/15/2024 CLINICAL DATA:  Shortness of breath. EXAM: PORTABLE CHEST 1 VIEW COMPARISON:  December 18, 2023 FINDINGS: There is stable single lead ventricular pacer positioning. Multiple sternal wires and vascular clips are seen. A radiopaque stimulator and associated stimulator wire is noted. The heart size and mediastinal contours are within normal limits. There is no evidence of acute infiltrate or pneumothorax. A small right pleural effusion is noted. The visualized skeletal structures are unremarkable. IMPRESSION: 1. Evidence of prior median sternotomy/CABG. 2. Small right pleural effusion. Electronically Signed   By: Aram Candela M.D.   On: 02/15/2024 00:11     Assessment and Plan:   Acute on chronic systolic heart failure/biventricular failure  -History of single-chamber Med[employer]onic  ICD in 2019 and Barostim 2023  -Supposed to be on 80 mg twice a day of torsemide, however patient only takes 40 mg twice a day.  Otherwise, patient has been compliant with other medications per her report.  -She does not have any lower extremity edema on exam, however she has bilateral basilar Rales with extensive rhonchi.  I suspect patient does have volume overload on top of COPD exacerbation.  -Continue digoxin, ivabradine, midodrine, spironolactone.  Consider give metolazone 2.5 mg daily for the next 2 days.  -Overall, poor long-term prognosis given biventricular failure.  Patient is DNR.  She is likely close to end-stage heart failure.  -Notified heart failure service, heart failure service to take over on Monday.  COPD exacerbation: Per primary team  Elevated troponin:   -Patient does describe occasional chest discomfort lasting from a few seconds to up to 1 minute when she lift heavy object.  Unclear if this is related to her severe cardiomyopathy versus underlying coronary artery disease.  Troponin elevation is not very  impressive, based on previous hospitalization record, her serial troponin frequently trended up to 400 range during COPD and heart failure exacerbation, this was felt to be demand ischemia during previous hospitalization.  She likely is progressing to end-stage heart failure, unclear benefit of left and right heart cath at this point.  Will discuss with MD.  CAD s/p CABG  - History of emergent CABG in 2019 - Last cardiac catheterization performed in March 2020 demonstrated 99% ostial ramus lesion treated with DES, otherwise patient has 100% ostial to proximal LAD occlusion, 100% ostial to proximal left circumflex artery occlusion, patent SVG to OM and a patent SVG to diagonal which supplies the LAD territory, atretic LIMA to LAD due to competitive flow from SVG to diagonal.  PAF: Previous history of PAF during the hospitalization for CABG.  On low-dose Eliquis.  Had a prior history of renal infarct, even though A-fib was not detected during the hospitalization, however patient was placed on Eliquis at that time.  She is on low-dose Eliquis, however other than weight, her creatinine is 1.4, age 89, technically she qualifies for the high-dose Eliquis   Hypertension: Continue spironolactone, midodrine.  No beta-blocker due to severely low EF  Hyperlipidemia   Risk Assessment/Risk Scores:        New York Heart Association (NYHA) Functional Class NYHA Class IV  CHA2DS2-VASc Score = 5   This indicates a 7.2% annual risk of stroke. The patient's score is based upon: CHF History: 1 HTN History: 1 Diabetes History: 1 Stroke History: 0 Vascular Disease History: 1 Age Score: 0 Gender Score: 1         For questions or updates, please contact [facility] Please consult [email or web address] for contact info under    Ramond Dial, [place]  02/16/2024 9:31 AM   Attending Note:   The patient was seen and examined.  Agree with assessment and plan as noted above.  Changes made to the above  note as needed.  Patient seen and independently examined with Azalee Course.   We discussed all aspects of the encounter. I agree with the assessment and plan as stated above.   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  Patient has a long history of CHF.  She is likely near end-stage CHF.  She was not taking her outpatient torsemide as directed and was admitted with volume overload.  She is currently a DNR. Continue  Lasix 80 mg IV twice daily. Continue spironolactone, midodrine 2.5 mg 3 times a day, Corlanor 7.5 mg twice daily, Jardiance 10 mg a day, Lanoxin 0.125 mg a day  2.  COPD :  she has extensive wheezing .   Will add albuterol .  She is a DNR   Have discuss with Dr. Ella Jubilee.  Will get palliative care / hospice conult.    I have spent a total of 40 minutes with patient reviewing hospital  notes , telemetry, EKGs, labs and examining patient as well as establishing an assessment and plan that was discussed with the patient.  > 50% of time was spent in direct patient care.    Vesta Mixer, Montez Hageman., MD, [facility] 02/16/2024, 10:49 AM [address],  [address] Office [phone] Pager [phone]

## 2024-02-16 NOTE — Progress Notes (Signed)
 RT note. Patient taken off bipap this morning, placed on 2 L [place]. RT will continue to monitor.    02/16/24 0729  Aerosol Therapy Tx  $ Hand Held Nebulizer  2  Medications Xopenex;Pulmicort  Delivery Source Oxygen  Delivery Device Aerogen  Pre-Treatment Pulse 96  Pre-Treatment Respirations 23  Treatment Tolerance Tolerated well  Treatment Given 2  RT Breath Sounds  Bilateral Breath Sounds Expiratory wheezes;Diminished  Oxygen Therapy/Pulse Ox  O2 Device (S)  Nasal Cannula  O2 Therapy Oxygen  O2 Flow Rate (L/min) 2 L/min  SpO2 100 %

## 2024-02-16 NOTE — Progress Notes (Signed)
   02/16/24 2322  BiPAP/CPAP/SIPAP  $ Non-Invasive Ventilator  Non-Invasive Vent Subsequent  BiPAP/CPAP/SIPAP Pt Type Adult  BiPAP/CPAP/SIPAP SERVO  Mask Type Full face mask  Mask Size Medium  Respiratory Rate 19 breaths/min  IPAP 10 cmH20  EPAP 5 cmH2O  Pressure Support 5 cmH20  PEEP 5 cmH20  FiO2 (%) 30 %  Minute Ventilation 12.5  Leak 9  Peak Inspiratory Pressure (PIP) 11  Tidal Volume (Vt) 643  Patient Home Equipment No  Auto Titrate No  Press High Alarm 25 cmH2O  Press Low Alarm 5 cmH2O   Pt placed on bipap and is resting comfortably with no distress

## 2024-02-16 NOTE — Plan of Care (Signed)
  Problem: Education: Goal: Knowledge of General Education information will improve Description: Including pain rating scale, medication(s)/side effects and non-pharmacologic comfort measures Outcome: Progressing   Problem: Health Behavior/Discharge Planning: Goal: Ability to manage health-related needs will improve Outcome: Progressing   Problem: Clinical Measurements: Goal: Ability to maintain clinical measurements within normal limits will improve Outcome: Progressing Goal: Will remain free from infection Outcome: Progressing Goal: Diagnostic test results will improve Outcome: Progressing Goal: Respiratory complications will improve Outcome: Progressing Goal: Cardiovascular complication will be avoided Outcome: Progressing   Problem: Activity: Goal: Risk for activity intolerance will decrease Outcome: Progressing   Problem: Nutrition: Goal: Adequate nutrition will be maintained Outcome: Progressing   Problem: Coping: Goal: Level of anxiety will decrease Outcome: Progressing   Problem: Elimination: Goal: Will not experience complications related to bowel motility Outcome: Progressing Goal: Will not experience complications related to urinary retention Outcome: Progressing   Problem: Pain Managment: Goal: General experience of comfort will improve and/or be controlled Outcome: Progressing   Problem: Safety: Goal: Ability to remain free from injury will improve Outcome: Progressing   Problem: Skin Integrity: Goal: Risk for impaired skin integrity will decrease Outcome: Progressing   Problem: Education: Goal: Ability to demonstrate management of disease process will improve Outcome: Progressing Goal: Ability to verbalize understanding of medication therapies will improve Outcome: Progressing Goal: Individualized Educational Video(s) Outcome: Progressing   Problem: Activity: Goal: Capacity to carry out activities will improve Outcome: Progressing    Problem: Cardiac: Goal: Ability to achieve and maintain adequate cardiopulmonary perfusion will improve Outcome: Progressing   Problem: Education: Goal: Knowledge of disease or condition will improve Outcome: Progressing Goal: Knowledge of the prescribed therapeutic regimen will improve Outcome: Progressing Goal: Individualized Educational Video(s) Outcome: Progressing   Problem: Activity: Goal: Ability to tolerate increased activity will improve Outcome: Progressing Goal: Will verbalize the importance of balancing activity with adequate rest periods Outcome: Progressing   Problem: Respiratory: Goal: Ability to maintain a clear airway will improve Outcome: Progressing Goal: Levels of oxygenation will improve Outcome: Progressing Goal: Ability to maintain adequate ventilation will improve Outcome: Progressing   Problem: Education: Goal: Ability to describe self-care measures that may prevent or decrease complications (Diabetes Survival Skills Education) will improve Outcome: Progressing Goal: Individualized Educational Video(s) Outcome: Progressing   Problem: Coping: Goal: Ability to adjust to condition or change in health will improve Outcome: Progressing   Problem: Fluid Volume: Goal: Ability to maintain a balanced intake and output will improve Outcome: Progressing   Problem: Health Behavior/Discharge Planning: Goal: Ability to identify and utilize available resources and services will improve Outcome: Progressing Goal: Ability to manage health-related needs will improve Outcome: Progressing   Problem: Metabolic: Goal: Ability to maintain appropriate glucose levels will improve Outcome: Progressing   Problem: Nutritional: Goal: Maintenance of adequate nutrition will improve Outcome: Progressing Goal: Progress toward achieving an optimal weight will improve Outcome: Progressing   Problem: Skin Integrity: Goal: Risk for impaired skin integrity will  decrease Outcome: Progressing   Problem: Tissue Perfusion: Goal: Adequacy of tissue perfusion will improve Outcome: Progressing

## 2024-02-16 NOTE — Plan of Care (Signed)
  Problem: Skin Integrity: Goal: Risk for impaired skin integrity will decrease Outcome: Progressing   Problem: Education: Goal: Ability to demonstrate management of disease process will improve Outcome: Progressing Goal: Ability to verbalize understanding of medication therapies will improve Outcome: Progressing   Problem: Cardiac: Goal: Ability to achieve and maintain adequate cardiopulmonary perfusion will improve Outcome: Progressing

## 2024-02-16 NOTE — Progress Notes (Addendum)
 Called by nursing patient in respiratory distress despite being on BiPAP satting 100%.  Patient tachycardic heart rate 120s.  Patient with ++ JVD, using abdominal assessor muscles.  Patient with wheezing.  Patient given 40 mg IV Lasix, 40 mg IV Solu-Medrol, Xopenex nebulizers and Ativan 1.5 mg.  VBG done reassuring.  Patient kept saying this is her she felt when she had her heart attack.  She states she does not typically have chest pains with a heart attack.  Patient currently denies chest pains.  Troponins ordered 50 =>120.  Repeat troponin ordered to assess trend.  EKG difficult to interpret which patient's DES device interference.  Cardiology consulted Dr Brien Few contacted.

## 2024-02-17 DIAGNOSIS — I480 Paroxysmal atrial fibrillation: Secondary | ICD-10-CM | POA: Diagnosis not present

## 2024-02-17 DIAGNOSIS — I5023 Acute on chronic systolic (congestive) heart failure: Secondary | ICD-10-CM | POA: Diagnosis not present

## 2024-02-17 DIAGNOSIS — I2510 Atherosclerotic heart disease of native coronary artery without angina pectoris: Secondary | ICD-10-CM | POA: Diagnosis not present

## 2024-02-17 DIAGNOSIS — J439 Emphysema, unspecified: Secondary | ICD-10-CM | POA: Diagnosis not present

## 2024-02-17 LAB — BASIC METABOLIC PANEL
Anion gap: 11 (ref 5–15)
BUN: 48 mg/dL — ABNORMAL HIGH (ref 8–23)
CO2: 24 mmol/L (ref 22–32)
Calcium: 9.9 mg/dL (ref 8.9–10.3)
Chloride: 102 mmol/L (ref 98–111)
Creatinine, Ser: 1.46 mg/dL — ABNORMAL HIGH (ref 0.44–1.00)
GFR, Estimated: 40 mL/min — ABNORMAL LOW (ref >60)
Glucose, Bld: 242 mg/dL — ABNORMAL HIGH (ref 70–99)
Potassium: 4 mmol/L (ref 3.5–5.1)
Sodium: 137 mmol/L (ref 135–145)

## 2024-02-17 LAB — GLUCOSE, CAPILLARY
Glucose-Capillary: 215 mg/dL — ABNORMAL HIGH (ref 70–99)
Glucose-Capillary: 199 mg/dL — ABNORMAL HIGH (ref 70–99)
Glucose-Capillary: 133 mg/dL — ABNORMAL HIGH (ref 70–99)
Glucose-Capillary: 213 mg/dL — ABNORMAL HIGH (ref 70–99)

## 2024-02-17 LAB — CBC
HCT: 31.7 % — ABNORMAL LOW (ref 36.0–46.0)
Hemoglobin: 10.4 g/dL — ABNORMAL LOW (ref 12.0–15.0)
MCH: 29 pg (ref 26.0–34.0)
MCHC: 32.8 g/dL (ref 30.0–36.0)
MCV: 88.3 fL (ref 80.0–100.0)
Platelets: 211 10*3/uL (ref 150–400)
RBC: 3.59 MIL/uL — ABNORMAL LOW (ref 3.87–5.11)
RDW: 18 % — ABNORMAL HIGH (ref 11.5–15.5)
WBC: 12.6 10*3/uL — ABNORMAL HIGH (ref 4.0–10.5)
nRBC: 0 % (ref 0.0–0.2)

## 2024-02-17 LAB — MAGNESIUM: Magnesium: 2.4 mg/dL (ref 1.7–2.4)

## 2024-02-17 MED ORDER — INSULIN GLARGINE 100 UNIT/ML ~~LOC~~ SOLN
5.0000 [IU] | Freq: Every day | SUBCUTANEOUS | Status: DC
  Administered 2024-02-17 – 2024-02-19 (×3): 5 [IU] via SUBCUTANEOUS
  Filled 2024-02-17 (×3): qty 0.05

## 2024-02-17 MED ORDER — LEVALBUTEROL HCL 0.63 MG/3ML IN NEBU
INHALATION_SOLUTION | RESPIRATORY_TRACT | Status: AC
Start: 2024-02-17 — End: 2024-02-17
  Filled 2024-02-17: qty 3

## 2024-02-17 MED ORDER — LEVALBUTEROL HCL 1.25 MG/0.5ML IN NEBU
1.2500 mg | INHALATION_SOLUTION | RESPIRATORY_TRACT | Status: DC | PRN
  Administered 2024-02-17 – 2024-02-18 (×2): 1.25 mg via RESPIRATORY_TRACT
  Filled 2024-02-17: qty 0.5

## 2024-02-17 MED ORDER — INSULIN GLARGINE-YFGN 100 UNIT/ML ~~LOC~~ SOPN: 5.0000 [IU] | PEN_INJECTOR | SUBCUTANEOUS | Status: DC

## 2024-02-17 NOTE — Progress Notes (Signed)
 Progress Note   Patient: Brittany [name] WGN:[id number] DOB: March 30, 1961 DOA: 02/14/2024     2 DOS: the patient was seen and examined on 02/17/2024   Brief hospital course: [name] was admitted to the hospital with the working diagnosis of heart failure exacerbation.   63 yo female with the past medical history of COPD, paroxysmal atrial fibrillation, hypertension, coronary artery disease, hyperlipidemia, heart failure, and small cell lung cancer sp radiation and chemotherapy in 2015 who presented with dyspnea. Reported 2 days of progressive dyspnea. Severe symptoms that prompted her to call EMS. She was found hypoxemic and in respiratory distress, 02 sat 90%. She was placed on Cpap and transported to the ED.  On her initial physical examination her blood pressure was 107/70, HR 104, RR 24 and 02 saturation 100% on Cpap.  Lungs with decreased breath sounds bilaterally, with no wheezing or rhonchi, heart with S1 and S2 present and regular, no gallops or rubs, abdomen with no distention and no lower extremity edema.   Na 142, K 3.6 cl 104, bicarbonate 20 glucose 264, bun 27 cr 1.20 BNP 2,541  Lactic acid 2,5 and 1.5  Wbc 10.6 hgb 12.1 plt 202   Sars covid 19 negative  Influenza negative   Chest radiograph with hyperinflation, positive cardiomegaly, with small right pleural effusion, mild bilateral hilar vascular congestion. Defibrillator in place with lead in the RV. Right upper chest with neuro stimulator. Positive sternotomy wires in place.   EKG 110 bpm, left axis deviation, normal intervals (positive artifact), sinus rhythm with q waves lead I and aVL, with no significant ST segment or T wave changes.   03/08 overnight chest pain and worsening dyspnea.  03/09 volume status and oxygenation improving but continue to have dyspnea.   Assessment and Plan: * Acute on chronic systolic CHF (congestive heart failure) (HCC) Echocardiogram with decreases systolic function EF  <20%, with global hypokinesis, LV cavity dilatated, RV with moderate to severe reduction in systolic function, LA and RA with normal size, no significant valvular disease.   Biventricular failure with poor prognosis.   Urine output documented at 1,350 ml    Continue diuresis with furosemide 80 mg IV bid.  Spironolactone.  Continue blood pressure monitoring. Continue ivabradin, empaglflozin, digoxin and midodrine.  Acute on chronic hypoxemic respiratory failure due to acute cardiogenic pulmonary edema and COPD exacerbation.  Continue bronchodilator therapy, steroids and diuresis.  Continue oxymetry monitoring, and supplemental 02 to keep 02 saturation 88% or greater.  02 saturation today 99% on 2 L/min per [place]   PAF (paroxysmal atrial fibrillation) (HCC) Continue rate control with digoxin and ivabradine.  Anticoagulation with apixaban.   CAD (coronary artery disease) CABG  No further chest pain.  High sensitive troponin elevation 120 and  220   Last cardiac catheterization in 2020 with significant underlying 2 vessel coronary artery disease with patent SVG to OM and SVG to diagonal. Had drug eluding stent placed to ostial ramus extending into the distal left main.   Continue statin, to consider adding aspirin.    COPD (chronic obstructive pulmonary disease) (HCC) Acute exacerbation.   Plan to continue systemic corticosteroids with prednisone and inhaled corticosteroids with budesonide Continue bronchodilator therapy with duoneb.  Incentive spirometer and flutter valve for airway clearing techniques.  Oxymetry monitoring and supplemental 02 per [place].   CKD stage 3b, GFR 30-44 ml/min (HCC) Stable renal function with serum cr at 1,46 with K at 4,0 and serum bicarbonate at 24  Na 137 and Mg 2.4  Plan to continue diuresis with furosemide Follow up renal function and electrolytes in am.   Essential hypertension Continue blood pressure support with midodrine. Continue diuresis  with furosemide and spironolactone.   Type 2 diabetes mellitus with hyperlipidemia (HCC) Uncontrolled with hyperglycemia.  Continue with insulin sliding scale and pre meal insulin  Will add basal insulin 5 units   Continue with statin therapy.   Protein-calorie malnutrition, severe (HCC) Continue nutritional supplements.        Subjective: Patient is feeling better this morning but not yet back to her baseline, continue to have dyspnea. No further chest pain, no nausea or vomiting and tolerating po well   Physical Exam: Vitals:   02/17/24 0500 02/17/24 0528 02/17/24 0732 02/17/24 1124  BP:  112/73 100/77 122/73  Pulse:  96 95 85  Resp:  20 17 19   Temp:  97.6 F (36.4 C) 98.2 F (36.8 C)   TempSrc:  Oral Oral   SpO2:  99% 99% 99%  Weight: 44.5 kg     Height:       Neurology awake and alert ENT With mild pallor Cardiovascular with S1 and S2 present and regular with no gallops or rubs, positive systolic murmur at the apex Moderate JVD No lower extremity edema Respiratory with prolonged expiratory phase with bilateral rhonchi and wheezing, scattered rales Abdomen with no distention  Data Reviewed:    Family Communication: no family at the bedside   Disposition: Status is: Inpatient Remains inpatient appropriate because: IV diuresis   Planned Discharge Destination: Home      Author: Coralie Keens, MD 02/17/2024 1:00 PM  For on call review [email or web address].

## 2024-02-17 NOTE — Plan of Care (Signed)
  Problem: Education: Goal: Knowledge of General Education information will improve Description: Including pain rating scale, medication(s)/side effects and non-pharmacologic comfort measures Outcome: Progressing   Problem: Health Behavior/Discharge Planning: Goal: Ability to manage health-related needs will improve Outcome: Progressing   Problem: Clinical Measurements: Goal: Ability to maintain clinical measurements within normal limits will improve Outcome: Progressing Goal: Will remain free from infection Outcome: Progressing Goal: Diagnostic test results will improve Outcome: Progressing Goal: Respiratory complications will improve Outcome: Progressing Goal: Cardiovascular complication will be avoided Outcome: Progressing   Problem: Activity: Goal: Risk for activity intolerance will decrease Outcome: Progressing   Problem: Nutrition: Goal: Adequate nutrition will be maintained Outcome: Progressing   Problem: Coping: Goal: Level of anxiety will decrease Outcome: Progressing   Problem: Elimination: Goal: Will not experience complications related to bowel motility Outcome: Progressing Goal: Will not experience complications related to urinary retention Outcome: Progressing   Problem: Pain Managment: Goal: General experience of comfort will improve and/or be controlled Outcome: Progressing   Problem: Safety: Goal: Ability to remain free from injury will improve Outcome: Progressing   Problem: Skin Integrity: Goal: Risk for impaired skin integrity will decrease Outcome: Progressing   Problem: Education: Goal: Ability to demonstrate management of disease process will improve Outcome: Progressing Goal: Ability to verbalize understanding of medication therapies will improve Outcome: Progressing Goal: Individualized Educational Video(s) Outcome: Progressing   Problem: Activity: Goal: Capacity to carry out activities will improve Outcome: Progressing    Problem: Cardiac: Goal: Ability to achieve and maintain adequate cardiopulmonary perfusion will improve Outcome: Progressing   Problem: Education: Goal: Knowledge of disease or condition will improve Outcome: Progressing Goal: Knowledge of the prescribed therapeutic regimen will improve Outcome: Progressing Goal: Individualized Educational Video(s) Outcome: Progressing   Problem: Activity: Goal: Ability to tolerate increased activity will improve Outcome: Progressing Goal: Will verbalize the importance of balancing activity with adequate rest periods Outcome: Progressing   Problem: Respiratory: Goal: Ability to maintain a clear airway will improve Outcome: Progressing Goal: Levels of oxygenation will improve Outcome: Progressing Goal: Ability to maintain adequate ventilation will improve Outcome: Progressing   Problem: Education: Goal: Ability to describe self-care measures that may prevent or decrease complications (Diabetes Survival Skills Education) will improve Outcome: Progressing Goal: Individualized Educational Video(s) Outcome: Progressing   Problem: Coping: Goal: Ability to adjust to condition or change in health will improve Outcome: Progressing   Problem: Fluid Volume: Goal: Ability to maintain a balanced intake and output will improve Outcome: Progressing   Problem: Health Behavior/Discharge Planning: Goal: Ability to identify and utilize available resources and services will improve Outcome: Progressing Goal: Ability to manage health-related needs will improve Outcome: Progressing   Problem: Metabolic: Goal: Ability to maintain appropriate glucose levels will improve Outcome: Progressing   Problem: Nutritional: Goal: Maintenance of adequate nutrition will improve Outcome: Progressing Goal: Progress toward achieving an optimal weight will improve Outcome: Progressing   Problem: Skin Integrity: Goal: Risk for impaired skin integrity will  decrease Outcome: Progressing   Problem: Tissue Perfusion: Goal: Adequacy of tissue perfusion will improve Outcome: Progressing

## 2024-02-17 NOTE — Progress Notes (Signed)
   02/17/24 2217  BiPAP/CPAP/SIPAP  BiPAP/CPAP/SIPAP Pt Type Adult  BiPAP/CPAP/SIPAP SERVO  Mask Type Full face mask  Dentures removed? Not applicable  Mask Size Medium  Respiratory Rate 15 breaths/min  IPAP 10 cmH20  EPAP 5 cmH2O  Pressure Support 5 cmH20  FiO2 (%) 40 %  Minute Ventilation 9.6  Leak 11  Peak Inspiratory Pressure (PIP) 10  Tidal Volume (Vt) 585  Patient Home Equipment No  Auto Titrate No  Press High Alarm 25 cmH2O  Press Low Alarm 5 cmH2O  CPAP/SIPAP surface wiped down Yes   Pt placed on bipap and resting comfortably with no complications.  RT will continue to monitor

## 2024-02-17 NOTE — Progress Notes (Signed)
 Pt removed from bipap at this time to 2LPM [place]. RN aware.

## 2024-02-17 NOTE — Progress Notes (Signed)
 Rounding Note    Patient Name: Brittany [name] Date of Encounter: 02/17/2024  [facility] Cardiologist:   Bensimhon   Subjective   63 year old female with a history of paroxysmal atrial fibrillation, previous history of lung cancer, coronary artery disease-status post CABG, biventricular failure, status post single-chamber ICD and Barostim device.  She was admitted with CHF She admits to not taking her outpatient torsemide as directed. She has diuresed 1.0 liters since yesterday  Wt is 44.5 kg  Labs are stable   She feels a bit better this am   She remains very debilitated  I suspect she is close to requiring palliative care / hospice care  I will ask the Dr. [name] and the CHF team to see her tomorrow.     Inpatient Medications    Scheduled Meds:  apixaban  5 mg Oral BID   aspirin EC  81 mg Oral Daily   atorvastatin  80 mg Oral Daily   budesonide (PULMICORT) nebulizer solution  0.25 mg Nebulization BID   digoxin  125 mcg Oral Daily   empagliflozin  10 mg Oral Daily   feeding supplement  237 mL Oral BID BM   furosemide  80 mg Intravenous Q12H   insulin aspart  0-9 Units Subcutaneous TID WC   insulin aspart  3 Units Subcutaneous TID WC   ivabradine  7.5 mg Oral BID WC   levalbuterol  1.25 mg Nebulization Q6H   megestrol  20 mg Oral BID   midodrine  2.5 mg Oral TID WC   multivitamin with minerals  1 tablet Oral Daily   predniSONE  40 mg Oral Q breakfast   spironolactone  25 mg Oral QHS   Continuous Infusions:  PRN Meds: acetaminophen **OR** acetaminophen, hydrOXYzine, levalbuterol, LORazepam   Vital Signs    Vitals:   02/17/24 0008 02/17/24 0500 02/17/24 0528 02/17/24 0732  BP: (!) 99/54  112/73 100/77  Pulse: 89  96 95  Resp: 18  20 17   Temp: 98.7 F (37.1 C)  97.6 F (36.4 C) 98.2 F (36.8 C)  TempSrc: Oral  Oral Oral  SpO2: 98%  99% 99%  Weight:  44.5 kg    Height:        Intake/Output Summary (Last 24 hours) at 02/17/2024  1048 Last data filed at 02/17/2024 0851 Gross per 24 hour  Intake 358 ml  Output 1350 ml  Net -992 ml      02/17/2024    5:00 AM 02/16/2024    9:00 AM 02/16/2024    4:58 AM  Last 3 Weights  Weight (lbs) 98 lb 1.6 oz 105 lb 1.5 oz 105 lb 1.6 oz  Weight (kg) 44.498 kg 47.67 kg 47.673 kg      Telemetry    NSR at 86  - Personally Reviewed  ECG     - Personally Reviewed  Physical Exam   GEN: very frail, chronically ill female ,   NAD    Neck: No JVD Cardiac: RRR,  ? S3 gallop  Respiratory: fewer wheezes today ,   GI: Soft, nontender, non-distended  MS: No edema; No deformity. Neuro:  Nonfocal  Psych: Normal affect   Labs    High Sensitivity Troponin:   Recent Labs  Lab 02/16/24 0234 02/16/24 0417 02/16/24 0744  TROPONINIHS 55* 120* 220*     Chemistry Recent Labs  Lab 02/15/24 1540 02/16/24 0234 02/17/24 0350  NA 135 136 137  K 4.1 3.9 4.0  CL 98 100 102  CO2 23 21* 24  GLUCOSE 294* 329* 242*  BUN 31* 38* 48*  CREATININE 1.36* 1.40* 1.46*  CALCIUM 9.9 9.8 9.9  MG  --  2.1 2.4  GFRNONAA 44* 42* 40*  ANIONGAP 14 15 11     Lipids No results for input(s): "CHOL", "TRIG", "HDL", "LABVLDL", "LDLCALC", "CHOLHDL" in the last 168 hours.  Hematology Recent Labs  Lab 02/14/24 2132 02/14/24 2210 02/15/24 1540 02/16/24 0234  WBC 10.6*  --  8.1 12.3*  RBC 4.26  --  4.16 3.95  HGB 12.1 13.3 11.7* 11.1*  HCT 38.9 39.0 36.5 34.9*  MCV 91.3  --  87.7 88.4  MCH 28.4  --  28.1 28.1  MCHC 31.1  --  32.1 31.8  RDW 18.6*  --  18.4* 18.0*  PLT 202  --  184 181   Thyroid No results for input(s): "TSH", "FREET4" in the last 168 hours.  BNP Recent Labs  Lab 02/14/24 2132  BNP 2,541.1*    DDimer  Recent Labs  Lab 02/16/24 0234  DDIMER 1.48*     Radiology    DG CHEST PORT 1 VIEW Result Date: 02/16/2024 CLINICAL DATA:  Dyspnea EXAM: PORTABLE CHEST 1 VIEW COMPARISON:  02/14/2024 FINDINGS: The lungs are symmetrically, mildly hyperinflated in keeping with changes of  underlying COPD. Cicatricial changes within the right hilar region correspond to post radiation changes better appreciated on prior CT examination 02/15/2024. Developing interstitial pulmonary infiltrate, however, likely reflects progressive, trace interstitial pulmonary edema. No pneumothorax. Trace right pleural effusion is unchanged. Coronary artery bypass grafting has been performed. Stable cardiomegaly. Left subclavian single lead pacemaker defibrillator is unchanged. Neurostimulator battery pack overlies the right hemithorax. No acute bone abnormality. IMPRESSION: 1. Progressive, trace interstitial pulmonary edema. 2. Stable cardiomegaly. 3. COPD. 4. Post radiation changes within right hilum, better appreciated on prior CT examination of 02/15/2024. Electronically Signed   By: Helyn Numbers M.D.   On: 02/16/2024 02:53    Cardiac Studies     Patient Profile     63 y.o. female    Assessment & Plan    1.  Acute on chronic combined systolic and diastolic congestive heart failure with evidence of right ventricular failure.  Patient has a long history of heart failure.  She has been seen by Dr. [name] in the advanced heart failure clinic.  I suspect that she is near end-stage CHF.  We may need to consider palliative care/hospice care soon.  She seems to have responded to will her diuresis and is breathing better today.  She remains a DNR.  Will have her follow up with the CHF team tomorrow to help [place] make a decision going forward.       For questions or updates, please contact [facility] Please consult [email or web address] for contact info under        Signed, Kristeen Miss, MD  02/17/2024, 10:48 AM

## 2024-02-18 DIAGNOSIS — I480 Paroxysmal atrial fibrillation: Secondary | ICD-10-CM | POA: Diagnosis not present

## 2024-02-18 DIAGNOSIS — I5023 Acute on chronic systolic (congestive) heart failure: Secondary | ICD-10-CM | POA: Diagnosis not present

## 2024-02-18 DIAGNOSIS — I2510 Atherosclerotic heart disease of native coronary artery without angina pectoris: Secondary | ICD-10-CM | POA: Diagnosis not present

## 2024-02-18 DIAGNOSIS — J439 Emphysema, unspecified: Secondary | ICD-10-CM | POA: Diagnosis not present

## 2024-02-18 LAB — BASIC METABOLIC PANEL
Anion gap: 10 (ref 5–15)
BUN: 45 mg/dL — ABNORMAL HIGH (ref 8–23)
CO2: 27 mmol/L (ref 22–32)
Calcium: 9.5 mg/dL (ref 8.9–10.3)
Chloride: 100 mmol/L (ref 98–111)
Creatinine, Ser: 1.49 mg/dL — ABNORMAL HIGH (ref 0.44–1.00)
GFR, Estimated: 39 mL/min — ABNORMAL LOW (ref >60)
Glucose, Bld: 231 mg/dL — ABNORMAL HIGH (ref 70–99)
Potassium: 3.9 mmol/L (ref 3.5–5.1)
Sodium: 137 mmol/L (ref 135–145)

## 2024-02-18 LAB — MAGNESIUM: Magnesium: 2.4 mg/dL (ref 1.7–2.4)

## 2024-02-18 LAB — GLUCOSE, CAPILLARY
Glucose-Capillary: 182 mg/dL — ABNORMAL HIGH (ref 70–99)
Glucose-Capillary: 116 mg/dL — ABNORMAL HIGH (ref 70–99)
Glucose-Capillary: 243 mg/dL — ABNORMAL HIGH (ref 70–99)
Glucose-Capillary: 280 mg/dL — ABNORMAL HIGH (ref 70–99)

## 2024-02-18 MED ORDER — TORSEMIDE 20 MG PO TABS
80.0000 mg | ORAL_TABLET | Freq: Two times a day (BID) | ORAL | Status: DC
  Administered 2024-02-19: 80 mg via ORAL
  Filled 2024-02-18: qty 4

## 2024-02-18 MED ORDER — APIXABAN 2.5 MG PO TABS
2.5000 mg | ORAL_TABLET | Freq: Two times a day (BID) | ORAL | Status: DC
  Administered 2024-02-18 – 2024-02-19 (×2): 2.5 mg via ORAL
  Filled 2024-02-18 (×2): qty 1

## 2024-02-18 MED ORDER — PREDNISONE 20 MG PO TABS
20.0000 mg | ORAL_TABLET | Freq: Every day | ORAL | Status: DC
  Administered 2024-02-19: 20 mg via ORAL
  Filled 2024-02-18: qty 1

## 2024-02-18 NOTE — Consult Note (Signed)
 [facility] Liaison Consult Note    02/18/2024  Makia Bossi 05-Nov-1961 [id number]  Insurance: Medicare ACO REACH  Primary Care Provider: Georganna Skeans, MD with [facility] Primary Care at [facility], this provider is listed for the transition of care follow up appointments  and [facility] calls   [facility] Liaison met patient at bedside at [facility].  Patient was napping but answered, explained reason for rounding visit.    The patient was screened for hospitalization with noted  extreme high risk score for unplanned readmission risk 3 hospital admissions in 6 months.  The patient was assessed for potential [facility] Coordination service needs for post hospital transition for care coordination. Review of patient's electronic medical record reveals patient is active with Advanced HF team.  Patient denies any additional follow up needs.   Plan: [facility] Liaison will continue to follow progress and disposition to asess for post hospital community care coordination/management needs.  Referral request for community care coordination: Denies coordination needs.  Anticipate post hospital [facility] team to follow for community.   [facility], Population Health does not replace or interfere with any arrangements made by the Inpatient Transition of Care team.   For questions contact:   Charlesetta Shanks, RN, BSN, CCM [place]  [facility], [facility] Liaison Direct Dial: [phone] or secure chat Email: [email or web address]

## 2024-02-18 NOTE — Progress Notes (Signed)
 Pt removed from bipap by request and placed back on 2L. No distress noted at this time, RN made aware

## 2024-02-18 NOTE — Evaluation (Signed)
 Physical Therapy Evaluation Patient Details Name: Brittany [name] MRN: [id number] DOB: [birth year] Today's Date: 02/18/2024  History of Present Illness  [name] is a 63 y.o. F admitted 02/14/24 with dyspnea. CXR with hyperinflation, positive cardiomegaly, with small right pleural effusion, mild bilateral hilar vascular congestion.  Workup found acute on chronic combined systolic and diastolic congestive heart failure with evidence of right ventricular failure. PMH of COPD, paroxysmal atrial fibrillation, HTN, CAD, s/p CABG, s/p ICD, HLD, heart failure, and small cell lung cancer s/p radiation and chemotherapy in 2015.   Clinical Impression  Pt admitted with above diagnosis. PTA, pt was independent with functional mobility without an AD and independent with ADLs. She resides alone in a rented room during the week that has a level entry and on the weekends stays with a friend in a two story house with 2 STE. Pt reports prn use of supplemental oxygen at home. Pt currently with functional limitations due to the deficits listed below (see PT Problem List). Examination limited secondary to pt's irritability and impulsiveness with mobility. She required supervision for all OOB mobility without an AD. Pt c/o DOE and is on 2L O2 via [place]. Pt will benefit from acute skilled PT to increase her independence and safety with mobility to allow d/c Home with Cardiac Rehab.     If plan is discharge home, recommend the following: A little help with walking and/or transfers;A little help with bathing/dressing/bathroom;Help with stairs or ramp for entrance;Assist for transportation   Can travel by private vehicle        Equipment Recommendations None recommended by PT  Recommendations for Other Services       Functional Status Assessment Patient has had a recent decline in their functional status and demonstrates the ability to make significant improvements in function in a reasonable and  predictable amount of time.     Precautions / Restrictions Precautions Precautions: Fall Recall of Precautions/Restrictions: Intact Restrictions Weight Bearing Restrictions Per Provider Order: No      Mobility  Bed Mobility Overal bed mobility: Modified Independent             General bed mobility comments: HOB elevated ~20deg. No use of bedrails. Pt quickly exited/entered bed, managing covers, and repositioning herself for comfort.    Transfers Overall transfer level: Needs assistance Equipment used: None Transfers: Sit to/from Stand Sit to Stand: Supervision           General transfer comment: Pt quickly stood from lowest bed height and demonstrated good eccentric control with sitting. Pt declined transfer to recliner chair.    Ambulation/Gait Ambulation/Gait assistance: Supervision Gait Distance (Feet): 10 Feet Assistive device: None Gait Pattern/deviations: Step-through pattern, Decreased stride length   Gait velocity interpretation: <1.8 ft/sec, indicate of risk for recurrent falls   General Gait Details: Pt quickly ambulated from EOB to sink before PT instructed her to get up as lines/leads needed to be managed. Distance was restricted by cords. Pt promtly turned around c/o SOB and DOE. Pt took quick short steps without an AD. Pt declined gait outside her room.  Stairs            Wheelchair Mobility     Tilt Bed    Modified Rankin (Stroke Patients Only)       Balance Overall balance assessment: Mild deficits observed, not formally tested  Pertinent Vitals/Pain Pain Assessment Pain Assessment: No/denies pain    Home Living Family/patient expects to be discharged to:: Private residence Living Arrangements: Alone;Non-relatives/Friends Available Help at Discharge: Family;Friend(s);Available PRN/intermittently Type of Home: Other(Comment) (Rented Room during the week & House on the  weekends) Home Access: Other (comment);Stairs to enter (Level Entry to rented room & Stairs to enter house) Entrance Stairs-Rails: None Entrance Stairs-Number of Steps: 2 Alternate Level Stairs-Number of Steps: Flight Home Layout: Other (Comment);Two level;One level (One level rented room & Two level House, where she could stay on the main level. Workout equipment is upstairs that she enjoys using.) Home Equipment: Rollator (4 wheels);Grab bars - toilet;Grab bars - tub/shower;Other (comment) (Oxygen) Additional Comments: Pt reports staying in a rented room during the weekdays and at her friends house on the weekends.    Prior Function Prior Level of Function : Independent/Modified Independent;Driving;History of Falls (last six months)             Mobility Comments: No AD use. Pt reports using the rollator when she has fluid, feels weak, and is concerned about falling. 2 falls in the past 67mo. ADLs Comments: Indep with ADLs. Pt states she doesn't drive often. Manages her own medications, finances, etc.     Extremity/Trunk Assessment   Upper Extremity Assessment Upper Extremity Assessment: Defer to OT evaluation    Lower Extremity Assessment Lower Extremity Assessment: Overall WFL for tasks assessed    Cervical / Trunk Assessment Cervical / Trunk Assessment: Normal  Communication   Communication Communication: No apparent difficulties    Cognition Arousal: Alert Behavior During Therapy: Agitated, Impulsive (Pt reports she becomes irritable easily on prednisone. She was quick to initate movement without attention to commands and allowing PT to appropriately manage lines/leads.)   PT - Cognitive impairments: Safety/Judgement                       PT - Cognition Comments: Pt A,Ox4 Following commands: Impaired Following commands impaired: Follows one step commands inconsistently     Cueing Cueing Techniques: Verbal cues, Tactile cues     General Comments General  comments (skin integrity, edema, etc.): VSS on 2L O2.    Exercises     Assessment/Plan    PT Assessment Patient needs continued PT services  PT Problem List Cardiopulmonary status limiting activity;Decreased activity tolerance;Decreased balance;Decreased mobility;Decreased safety awareness       PT Treatment Interventions Stair training;Gait training;Functional mobility training;DME instruction;Therapeutic activities;Therapeutic exercise;Balance training;Patient/family education    PT Goals (Current goals can be found in the Care Plan section)  Acute Rehab PT Goals Patient Stated Goal: Return Home PT Goal Formulation: With patient Time For Goal Achievement: 03/03/24 Potential to Achieve Goals: Good    Frequency Min 2X/week     Co-evaluation               AM-PAC PT "6 Clicks" Mobility  Outcome Measure Help needed turning from your back to your side while in a flat bed without using bedrails?: A Little Help needed moving from lying on your back to sitting on the side of a flat bed without using bedrails?: A Little Help needed moving to and from a bed to a chair (including a wheelchair)?: A Little Help needed standing up from a chair using your arms (e.g., wheelchair or bedside chair)?: A Little Help needed to walk in hospital room?: A Little Help needed climbing 3-5 steps with a railing? : A Little 6 Click Score: 18  End of Session Equipment Utilized During Treatment: Oxygen;Other (comment) (Pt impulsively started ambulation before gait belt could be donned or lines/leads managed.) Activity Tolerance: Treatment limited secondary to agitation;Patient limited by fatigue Patient left: in bed;with call bell/phone within reach Nurse Communication: Mobility status PT Visit Diagnosis: Difficulty in walking, not elsewhere classified (R26.2)    Time: 1610-9604 PT Time Calculation (min) (ACUTE ONLY): 16 min   Charges:   PT Evaluation $PT Eval Moderate Complexity: 1 Mod    PT General Charges $$ ACUTE PT VISIT: 1 Visit         Cheri Guppy, PT, DPT Acute Rehabilitation Services Office: [phone] Secure Chat Preferred  Richardson Chiquito 02/18/2024, 10:35 AM

## 2024-02-18 NOTE — Progress Notes (Signed)
 Pt declined use of BIPAP for the evening.

## 2024-02-18 NOTE — Evaluation (Signed)
 Occupational Therapy Evaluation Patient Details Name: Brittany [name] MRN: [id number] DOB: 23-May-1961 Today's Date: 02/18/2024   History of Present Illness   Brittany [name] is a 63 y.o. F admitted 02/14/24 with dyspnea. CXR with hyperinflation, positive cardiomegaly, with small right pleural effusion, mild bilateral hilar vascular congestion.  Workup found acute on chronic combined systolic and diastolic congestive heart failure with evidence of right ventricular failure. PMH of COPD, paroxysmal atrial fibrillation, HTN, CAD, s/p CABG, s/p ICD, HLD, heart failure, and small cell lung cancer s/p radiation and chemotherapy in 2015.     Clinical Impressions Pt presents with decline in function and safety with ADLs and ADL mobility with impaired activity tolerance. Pt reports that PTA, she was independent with ADLs/selfcare and functional mobility without an AD. She resides alone in a rented room during the week and on the weekends stays with a friend. Pt reports prn use of supplemental oxygen at home. Pt would benefit from acute OT services to address impairments to maximize level of function and safety     If plan is discharge home, recommend the following:   A little help with bathing/dressing/bathroom;Assistance with cooking/housework;Assist for transportation;Help with stairs or ramp for entrance     Functional Status Assessment   Patient has had a recent decline in their functional status and demonstrates the ability to make significant improvements in function in a reasonable and predictable amount of time.     Equipment Recommendations   Tub/shower seat     Recommendations for Other Services         Precautions/Restrictions   Precautions Precautions: Fall Recall of Precautions/Restrictions: Intact Restrictions Weight Bearing Restrictions Per Provider Order: No     Mobility Bed Mobility Overal bed mobility: Modified Independent                   Transfers Overall transfer level: Needs assistance Equipment used: None Transfers: Sit to/from Stand Sit to Stand: Supervision                  Balance Overall balance assessment: Mild deficits observed, not formally tested                                         ADL either performed or assessed with clinical judgement   ADL Overall ADL's : Needs assistance/impaired Eating/Feeding: Independent   Grooming: Wash/dry hands;Wash/dry face;Contact guard assist;Standing   Upper Body Bathing: Modified independent   Lower Body Bathing: Contact guard assist;Supervison/ safety   Upper Body Dressing : Modified independent   Lower Body Dressing: Contact guard assist;Supervision/safety   Toilet Transfer: Supervision/safety;Ambulation;Regular Toilet;Grab bars;Cueing for safety   Toileting- Clothing Manipulation and Hygiene: Contact guard assist;Supervision/safety;Sit to/from stand   Tub/ Shower Transfer: Contact guard assist;Supervision/safety;Ambulation;Grab bars   Functional mobility during ADLs: Supervision/safety;Cueing for safety General ADL Comments: pt reports fatigue with minimal activity     Vision Ability to See in Adequate Light: 0 Adequate Patient Visual Report: No change from baseline       Perception         Praxis         Pertinent Vitals/Pain Pain Assessment Pain Assessment: No/denies pain     Extremity/Trunk Assessment Upper Extremity Assessment Upper Extremity Assessment: Overall WFL for tasks assessed   Lower Extremity Assessment Lower Extremity Assessment: Defer to PT evaluation   Cervical / Trunk Assessment Cervical / Trunk Assessment: Normal  Communication Communication Communication: No apparent difficulties   Cognition Arousal: Alert Behavior During Therapy: WFL for tasks assessed/performed Cognition: No apparent impairments                               Following commands: Impaired Following  commands impaired: Follows one step commands inconsistently     Cueing  General Comments   Cueing Techniques: Verbal cues;Tactile cues      Exercises     Shoulder Instructions      Home Living Family/patient expects to be discharged to:: Private residence Living Arrangements: Alone;Non-relatives/Friends Available Help at Discharge: Family;Friend(s);Available PRN/intermittently Type of Home: Other(Comment) Home Access: Other (comment);Stairs to enter [employer] of Steps: 2 Entrance Stairs-Rails: None Home Layout: Other (Comment);Two level;One level Alternate Level Stairs-Number of Steps: Flight Alternate Level Stairs-Rails: Right;Left;Can reach both Bathroom Shower/Tub: Producer, television/film/video: Standard     Home Equipment: Rollator (4 wheels);Grab bars - toilet;Grab bars - tub/shower;Other (comment) (O2)   Additional Comments: Pt reports staying in a rented room during the weekdays and at her friends house on the weekends.      Prior Functioning/Environment Prior Level of Function : Independent/Modified Independent;Driving;History of Falls (last six months)             Mobility Comments: No AD use. Pt reports using the rollator when she has fluid, feels weak, and is concerned about falling. 2 falls in the past 46mo. ADLs Comments: Indep with ADLs. Pt states she doesn't drive often. Manages her own medications, finances, etc.    OT Problem List: Decreased activity tolerance;Decreased knowledge of use of DME or AE   OT Treatment/Interventions: Energy conservation;Patient/family education;Therapeutic exercise;DME and/or AE instruction;Self-care/ADL training;Therapeutic activities      OT Goals(Current goals can be found in the care plan section)   Acute Rehab OT Goals Patient Stated Goal: get some rest and go home OT Goal Formulation: With patient Time For Goal Achievement: 03/03/24 Potential to Achieve Goals: Good ADL Goals Pt Will Perform  Grooming: with modified independence;standing Pt Will Perform Lower Body Bathing: with set-up;with modified independence;sit to/from stand Pt Will Perform Lower Body Dressing: with set-up;with modified independence;sit to/from stand Pt Will Transfer to Toilet: with modified independence;ambulating Pt Will Perform Toileting - Clothing Manipulation and hygiene: with modified independence;sit to/from stand Pt Will Perform Tub/Shower Transfer: with supervision;with modified independence;ambulating;grab bars Additional ADL Goal #1: Pt will verbalize and demo 3 energy conservation strategies for ADLs and ADL mobility   OT Frequency:  Min 1X/week    Co-evaluation              AM-PAC OT "6 Clicks" Daily Activity     Outcome Measure Help from another person eating meals?: None Help from another person taking care of personal grooming?: A Little Help from another person toileting, which includes using toliet, bedpan, or urinal?: A Little Help from another person bathing (including washing, rinsing, drying)?: A Little Help from another person to put on and taking off regular upper body clothing?: A Little Help from another person to put on and taking off regular lower body clothing?: A Little 6 Click Score: 19   End of Session    Activity Tolerance: Patient limited by fatigue Patient left: in bed;with call bell/phone within reach  OT Visit Diagnosis: Muscle weakness (generalized) (M62.81)                Time: 0981-1914 OT Time Calculation (min): 24  min Charges:  OT General Charges $OT Visit: 1 Visit OT Evaluation $OT Eval Low Complexity: 1 Low OT Treatments $Self Care/Home Management : 8-22 mins    Margaretmary Eddy [facility] 02/18/2024, 3:07 PM

## 2024-02-18 NOTE — TOC Initial Note (Addendum)
 Transition of Care [facility]) - Initial/Assessment Note    Patient Details  Name: Brittany [name] MRN: [id number] Date of Birth: 1961-05-17  Transition of Care [facility]) CM/SW Contact:    Leone Haven, RN Phone Number: 02/18/2024, 3:53 PM  Clinical Narrative:                 From home alone, indep, has PCP , Vanessa Kick and insurance on file, states has no HH services in place at this time , has rollator, home oxygen 2 liters with [employer], and bipap at home.  States friend will transport them home at [employer] and sister, niece, and friends  is support system, states gets [facility] [address].  Pta self ambulatory.  Per pt eval rec Cardiac Rehab.  Per PT eval rec shower stool,  NCM asked patient if she wanted this she states yes and she does not have a preference of the agency.  NCM made referral to [facility] with [employer], this will be  brought up to patient room prior to dc.  Expected Discharge Plan: Home/Self Care Barriers to Discharge: Continued Medical Work up   Patient Goals and CMS Choice Patient states their goals for this hospitalization and ongoing recovery are:: return home   Choice offered to / list presented to : NA      Expected Discharge Plan and Services In-house Referral: NA Discharge Planning Services: CM Consult Post Acute Care Choice: NA Living arrangements for the past 2 months: Single Family Home                 DME Arranged: N/A DME Agency: NA       HH Arranged: NA          Prior Living Arrangements/Services Living arrangements for the past 2 months: Single Family Home Lives with:: Self Patient language and need for interpreter reviewed:: Yes Do you feel safe going back to the place where you live?: Yes      Need for Family Participation in Patient Care: Yes (Comment) Care giver support system in place?: Yes (comment) Current home services: DME (rollator, home oxygen 2 liters with [employer], bipap) Criminal Activity/Legal Involvement  Pertinent to Current Situation/Hospitalization: No - Comment as needed  Activities of Daily Living   ADL Screening (condition at time of admission) Independently performs ADLs?: Yes (appropriate for developmental age) Is the patient deaf or have difficulty hearing?: No Does the patient have difficulty seeing, even when wearing glasses/contacts?: No Does the patient have difficulty concentrating, remembering, or making decisions?: No  Permission Sought/Granted Permission sought to share information with : Case Manager Permission granted to share information with : Yes, Verbal Permission Granted              Emotional Assessment Appearance:: Appears stated age Attitude/Demeanor/Rapport: Engaged Affect (typically observed): Appropriate Orientation: : Oriented to Self, Oriented to Place, Oriented to  Time, Oriented to Situation Alcohol / Substance Use: Not Applicable Psych Involvement: No (comment)  Admission diagnosis:  CHF (congestive heart failure) (HCC) [I50.9] COPD exacerbation (HCC) [J44.1] Congestive heart failure, unspecified HF chronicity, unspecified heart failure type (HCC) [I50.9] Patient Active Problem List   Diagnosis Date Noted   History of CAD (coronary artery disease) 12/15/2023   Hx of hypokalemia 12/15/2023   Respiratory distress 12/15/2023   Chronic respiratory failure with hypoxia and hypercapnia (HCC) 10/11/2023   Acute on chronic systolic CHF (congestive heart failure) (HCC) 09/25/2023   Acute kidney injury superimposed on chronic kidney disease (HCC) 08/04/2023   Acute  on chronic respiratory failure (HCC) 08/03/2023   Trichomonas infection 04/19/2023   Presence of heart assist device (HCC) 04/11/2023   COPD with acute exacerbation (HCC) 03/01/2023   CAD (coronary artery disease) 03/01/2023   Acute hypoxic respiratory failure (HCC) 11/14/2022   Sepsis (HCC) 11/14/2022   Influenza A with pneumonia 11/14/2022   Troponin level elevated 11/14/2022    History of lung cancer 11/14/2022   Acute combined systolic and diastolic CHF, NYHA class 4 (HCC) 07/09/2022   Malnutrition of moderate degree 06/20/2022   Lactic acidosis 04/12/2022   Prolonged QT interval 04/09/2022   Hypotension 04/09/2022   Chronic HFrEF (heart failure with reduced ejection fraction) (HCC) 12/11/2021   CHF exacerbation (HCC) 12/10/2021   Renal infarct (HCC) 12/10/2021   COPD exacerbation (HCC) 09/29/2021   Mixed diabetic hyperlipidemia associated with type 2 diabetes mellitus (HCC) 09/29/2021   CKD stage 3b, GFR 30-44 ml/min (HCC) 09/29/2021   Unstable angina (HCC) 02/09/2021   Hemoptysis 10/20/2020   Type 2 diabetes mellitus with hyperlipidemia (HCC) 10/20/2020   Acute on chronic combined systolic and diastolic CHF (congestive heart failure) (HCC) 05/21/2020   Cardiomyopathy, ischemic 04/12/2020   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   NSTEMI (non-ST elevated myocardial infarction) (HCC) 02/12/2019   CHF (congestive heart failure) (HCC) 11/24/2018   Post-operative pain 09/10/2018   COPD (chronic obstructive pulmonary disease) (HCC) 09/04/2018   Recurrent pleural effusion on right 05/05/2018   Acute on chronic respiratory failure with hypoxia (HCC) 05/05/2018   Chronic respiratory failure with hypoxia (HCC)    Asthma    Anxiety    Allergy    HCAP (healthcare-associated pneumonia) 03/15/2015   Chest pain 03/15/2015   Small cell lung cancer (HCC) 03/16/2014   Protein-calorie malnutrition, severe (HCC) 03/14/2014   PAF (paroxysmal atrial fibrillation) (HCC) 03/12/2014   Essential hypertension 05/07/2012   PCP:  Georganna Skeans, MD Pharmacy:   [id number] [place] [place] - [address] [place] [address] Phone: [phone] Fax: [phone]     Social Drivers of Health (SDOH) Social History: SDOH Screenings   Food Insecurity: No Food Insecurity (02/15/2024)  Housing: Low Risk  (02/15/2024)   Transportation Needs: No Transportation Needs (02/15/2024)  Recent Concern: Transportation Needs - Unmet Transportation Needs (12/22/2023)  Utilities: Not At Risk (02/15/2024)  Alcohol Screen: Low Risk  (02/11/2021)  Depression (PHQ2-9): Low Risk  (01/15/2024)  Financial Resource Strain: Medium Risk (02/11/2021)  Physical Activity: Insufficiently Active (02/11/2021)  Social Connections: Unknown (02/11/2021)  Stress: Stress Concern Present (02/11/2021)  Tobacco Use: Medium Risk (02/14/2024)   SDOH Interventions:     Readmission Risk Interventions    02/18/2024    3:51 PM 06/20/2022   10:00 AM  Readmission Risk Prevention Plan  Transportation Screening Complete Complete  PCP or Specialist Appt within 3-5 Days  Complete  HRI or Home Care Consult  Complete  Social Work Consult for Recovery Care Planning/Counseling  Complete  Palliative Care Screening  Not Applicable  Medication Review [employer]) Complete Complete  PCP or Specialist appointment within 3-5 days of discharge Complete   HRI or Home Care Consult Complete   Palliative Care Screening Not Applicable   Skilled Nursing Facility Not Applicable

## 2024-02-18 NOTE — Progress Notes (Addendum)
 Progress Note   Patient: Brittany [name] XLK:[id number] DOB: [birth year] DOA: 02/14/2024     3 DOS: the patient was seen and examined on 02/18/2024   Brief hospital course: [name] was admitted to the hospital with the working diagnosis of heart failure exacerbation.   63 yo female with the past medical history of COPD, paroxysmal atrial fibrillation, hypertension, coronary artery disease, hyperlipidemia, heart failure, and small cell lung cancer sp radiation and chemotherapy in 2015 who presented with dyspnea. Reported 2 days of progressive dyspnea. Severe symptoms that prompted her to call EMS. She was found hypoxemic and in respiratory distress, 02 sat 90%. She was placed on Cpap and transported to the ED.  On her initial physical examination her blood pressure was 107/70, HR 104, RR 24 and 02 saturation 100% on Cpap.  Lungs with decreased breath sounds bilaterally, with no wheezing or rhonchi, heart with S1 and S2 present and regular, no gallops or rubs, abdomen with no distention and no lower extremity edema.   Na 142, K 3.6 cl 104, bicarbonate 20 glucose 264, bun 27 cr 1.20 BNP 2,541  Lactic acid 2,5 and 1.5  Wbc 10.6 hgb 12.1 plt 202   Sars covid 19 negative  Influenza negative   Chest radiograph with hyperinflation, positive cardiomegaly, with small right pleural effusion, mild bilateral hilar vascular congestion. Defibrillator in place with lead in the RV. Right upper chest with neuro stimulator. Positive sternotomy wires in place.   EKG 110 bpm, left axis deviation, normal intervals (positive artifact), sinus rhythm with q waves lead I and aVL, with no significant ST segment or T wave changes.   03/08 overnight chest pain and worsening dyspnea.  03/09 volume status and oxygenation improving but continue to have dyspnea.  03./10 clinically improving, transition to po loop diuretics today and possible discharge home tomorrow.   Assessment and Plan: * Acute on  chronic systolic CHF (congestive heart failure) (HCC) Echocardiogram with decreases systolic function EF <20%, with global hypokinesis, LV cavity dilatated, RV with moderate to severe reduction in systolic function, LA and RA with normal size, no significant valvular disease.   Biventricular failure with poor prognosis.   Urine output documented at 2,450 ml  Fluid balance negative since admission 3,081 ml,    Transition to po torsemide 80 mg po bid and continue with  spironolactone.  Continue ivabradin, empaglflozin, digoxin and midodrine. Limited pharmacologic therapy due to risk of hypotension.   Acute on chronic hypoxemic respiratory failure due to acute cardiogenic pulmonary edema and COPD exacerbation.  Continue bronchodilator therapy, steroids and diuresis.  Continue oxymetry monitoring, and supplemental 02 to keep 02 saturation 88% or greater.  02 saturation today 100 % on 2 L/min per [place]   COPD (chronic obstructive pulmonary disease) (HCC) Acute exacerbation.   Improved symptoms and oxygenation.  Plan to continue decrease dose of systemic corticosteroids with prednisone  to 20 mg daily.  Continue with inhaled corticosteroids with budesonide Continue bronchodilator therapy with duoneb.  Incentive spirometer and flutter valve for airway clearing techniques.  Oxymetry monitoring and supplemental 02 per [place].   Reactive leukocytosis due to steroids and chronic anemia.   CAD (coronary artery disease) CABG  No further chest pain.  High sensitive troponin elevation 120 and  220   Last cardiac catheterization in 2020 with significant underlying 2 vessel coronary artery disease with patent SVG to OM and SVG to diagonal. Had drug eluding stent placed to ostial ramus extending into the distal left main.  Continue statin, and aspirin.    PAF (paroxysmal atrial fibrillation) (HCC) Continue rate control with digoxin and ivabradine.  Anticoagulation with apixaban.   CKD stage 3b,  GFR 30-44 ml/min (HCC) Renal function stable with serum cr at 1,49 with K at 3,9 and serum bicarbonate at 27  Na 137   Transition to po torsemide.  Follow up renal function and electrolytes in am.   Essential hypertension Continue blood pressure support with midodrine. Continue diuresis with torsemide and spironolactone.   Type 2 diabetes mellitus with hyperlipidemia (HCC) Uncontrolled with hyperglycemia.  Continue with insulin sliding scale and pre meal insulin  Continue with basal insulin 5 units  Will decease dose of systemic corticosteroids.   Continue with statin therapy.   Protein-calorie malnutrition, severe (HCC) Continue nutritional supplements.         Subjective: Patient is feeling better, her dyspnea has been improving, no further chest pain, she is very weak and deconditioned. At home on 2 L/min per [place].   Physical Exam: Vitals:   02/18/24 0500 02/18/24 0533 02/18/24 0851 02/18/24 1111  BP:  110/70 120/74 (!) 99/46  Pulse:  95 98 81  Resp:  14 17 15   Temp:  98.3 F (36.8 C) 98.3 F (36.8 C)   TempSrc:  Oral Oral   SpO2:  100% 100% 100%  Weight: 44.3 kg     Height:       Neurology awake and alert ENT with mild pallor Cardiovascular with S1 and S2 present irregularly irregular with positive systolic murmur at the apex with no gallops or rubs.  Mild JVD No lower extremity edema Respiratory with poor inspiratory effort, bilateral rhonchi with no wheezing, positive scattered rales.  Abdomen with no distention, non tender  Data Reviewed:    Family Communication: no family at the bedside   Disposition: Status is: Inpatient Remains inpatient appropriate because: Diuresis, possible discharge home tomorrow   Planned Discharge Destination: Home     Author: Coralie Keens, MD 02/18/2024 3:02 PM  For on call review [email or web address].

## 2024-02-18 NOTE — Progress Notes (Signed)
 No ICM remote transmission received for 02/18/2024 due to currently hospitalized.  ICM transmission rescheduled for 03/03/2024.

## 2024-02-19 ENCOUNTER — Other Ambulatory Visit (HOSPITAL_COMMUNITY): Payer: Self-pay

## 2024-02-19 DIAGNOSIS — I5023 Acute on chronic systolic (congestive) heart failure: Secondary | ICD-10-CM | POA: Diagnosis not present

## 2024-02-19 DIAGNOSIS — I2510 Atherosclerotic heart disease of native coronary artery without angina pectoris: Secondary | ICD-10-CM | POA: Diagnosis not present

## 2024-02-19 DIAGNOSIS — I480 Paroxysmal atrial fibrillation: Secondary | ICD-10-CM | POA: Diagnosis not present

## 2024-02-19 DIAGNOSIS — J439 Emphysema, unspecified: Secondary | ICD-10-CM | POA: Diagnosis not present

## 2024-02-19 LAB — RENAL FUNCTION PANEL
Albumin: 3.1 g/dL — ABNORMAL LOW (ref 3.5–5.0)
Anion gap: 7 (ref 5–15)
BUN: 38 mg/dL — ABNORMAL HIGH (ref 8–23)
CO2: 30 mmol/L (ref 22–32)
Calcium: 9.6 mg/dL (ref 8.9–10.3)
Chloride: 103 mmol/L (ref 98–111)
Creatinine, Ser: 1.14 mg/dL — ABNORMAL HIGH (ref 0.44–1.00)
GFR, Estimated: 54 mL/min — ABNORMAL LOW (ref >60)
Glucose, Bld: 103 mg/dL — ABNORMAL HIGH (ref 70–99)
Phosphorus: 3 mg/dL (ref 2.5–4.6)
Potassium: 3.9 mmol/L (ref 3.5–5.1)
Sodium: 140 mmol/L (ref 135–145)

## 2024-02-19 LAB — CULTURE, BLOOD (ROUTINE X 2)
Culture: NO GROWTH
Culture: NO GROWTH

## 2024-02-19 LAB — GLUCOSE, CAPILLARY
Glucose-Capillary: 134 mg/dL — ABNORMAL HIGH (ref 70–99)
Glucose-Capillary: 243 mg/dL — ABNORMAL HIGH (ref 70–99)

## 2024-02-19 MED ORDER — LEVALBUTEROL HCL 1.25 MG/0.5ML IN NEBU: 1.2500 mg | INHALATION_SOLUTION | Freq: Two times a day (BID) | RESPIRATORY_TRACT | Status: DC

## 2024-02-19 MED ORDER — EMPAGLIFLOZIN 10 MG PO TABS
10.0000 mg | ORAL_TABLET | Freq: Every day | ORAL | 0 refills | Status: DC
  Filled 2024-02-19: qty 30, 30d supply, fill #0

## 2024-02-19 MED ORDER — LEVALBUTEROL HCL 1.25 MG/0.5ML IN NEBU
1.2500 mg | INHALATION_SOLUTION | Freq: Four times a day (QID) | RESPIRATORY_TRACT | Status: DC
Start: 2024-02-19 — End: 2024-02-19
  Administered 2024-02-19: 1.25 mg via RESPIRATORY_TRACT
  Filled 2024-02-19: qty 0.5

## 2024-02-19 NOTE — Discharge Summary (Signed)
 Physician Discharge Summary   Patient: Bill Mcvey MRN: [id number] DOB: November 13, 1961  Admit date:     02/14/2024  Discharge date: 02/19/24  Discharge Physician: York Ram Dut[place] Crosland   PCP: Georganna Skeans, MD   Recommendations at discharge:    Patient will continue diuresis with furosemide Continue inhaled corticosteroids.  Patient with advanced heart failure and COPD, to consider palliative care services as outpatient.  Follow up with Dr Andrey Campanile in 7 to 10 days Follow up with Cardiology as scheduled.   Discharge Diagnoses: Principal Problem:   Acute on chronic systolic CHF (congestive heart failure) (HCC) Active Problems:   COPD (chronic obstructive pulmonary disease) (HCC)   CAD (coronary artery disease)   PAF (paroxysmal atrial fibrillation) (HCC)   CKD stage 3b, GFR 30-44 ml/min (HCC)   Essential hypertension   Type 2 diabetes mellitus with hyperlipidemia (HCC)   Protein-calorie malnutrition, severe (HCC)  Resolved Problems:   * No resolved hospital problems. [facility] Course: [name] was admitted to the hospital with the working diagnosis of heart failure and COPD exacerbation.   63 yo female with the past medical history of COPD, paroxysmal atrial fibrillation, hypertension, coronary artery disease, hyperlipidemia, heart failure, and small cell lung cancer sp radiation and chemotherapy in 2015 who presented with dyspnea. Reported 2 days of progressive dyspnea. Severe symptoms that prompted her to call EMS. She was found hypoxemic and in respiratory distress, 02 sat 90%. She was placed on Cpap and transported to the ED.  On her initial physical examination her blood pressure was 107/70, HR 104, RR 24 and 02 saturation 100% on Cpap.  Lungs with decreased breath sounds bilaterally, with no wheezing or rhonchi, heart with S1 and S2 present and regular, no gallops or rubs, abdomen with no distention and no lower extremity edema.   Na 142, K 3.6 cl 104,  bicarbonate 20 glucose 264, bun 27 cr 1.20 BNP 2,541  Lactic acid 2,5 and 1.5  Wbc 10.6 hgb 12.1 plt 202   Sars covid 19 negative  Influenza negative   Chest radiograph with hyperinflation, positive cardiomegaly, with small right pleural effusion, mild bilateral hilar vascular congestion. Defibrillator in place with lead in the RV. Right upper chest with neuro stimulator. Positive sternotomy wires in place.   EKG 110 bpm, left axis deviation, normal intervals (positive artifact), sinus rhythm with q waves lead I and aVL, with no significant ST segment or T wave changes.   03/08 overnight chest pain and worsening dyspnea.  03/09 volume status and oxygenation improving but continue to have dyspnea.  03./10 clinically improving, transition to po loop diuretics today and possible discharge home tomorrow.  03/11 patient medically stable for discharge home, close follow up as outpatient.   Assessment and Plan: * Acute on chronic systolic CHF (congestive heart failure) (HCC) Echocardiogram with decreases systolic function EF <20%, with global hypokinesis, LV cavity dilatated, RV with moderate to severe reduction in systolic function, LA and RA with normal size, no significant valvular disease.   Biventricular failure with poor prognosis.   Patient was placed on IV furosemide for diuresis, negative fluid balance was achieved, -4.959 ml, with significant improvement in her symptoms.    Patient will continue diuresis with torsemide 80 mg po bid and spironolactone.  Continue ivabradin, empaglflozin, digoxin and midodrine. Limited pharmacologic therapy due to risk of hypotension.   Acute on chronic hypoxemic respiratory failure due to acute cardiogenic pulmonary edema and COPD exacerbation.  Patient was placed on  bronchodilator therapy,  steroids and diuresis.   At the time of her discharge her 02 saturation was 97% on room air at rest.  Patient will continue bronchodilator therapy and inhaled  corticosteroids.  Follow up as outpatient. Continue home bipap at night.   COPD (chronic obstructive pulmonary disease) (HCC) Acute exacerbation.   At the time of her discharge her 02 saturation is 97% on room air at rest.  .  Continue with inhaled corticosteroids with budesonide Continue bronchodilator therapy with duoneb.  Incentive spirometer and flutter valve for airway clearing techniques.  Patient on supplemental 02 at home per [place].   Reactive leukocytosis due to steroids and chronic anemia.   CAD (coronary artery disease) CABG  No further chest pain.  High sensitive troponin elevation 120 and  220  Acute coronary syndrome has been ruled out.   Last cardiac catheterization in 2020 with significant underlying 2 vessel coronary artery disease with patent SVG to OM and SVG to diagonal. Had drug eluding stent placed to ostial ramus extending into the distal left main.   Continue statin, and aspirin.    PAF (paroxysmal atrial fibrillation) (HCC) Continue rate control with digoxin and ivabradine.  Anticoagulation with apixaban.   CKD stage 3b, GFR 30-44 ml/min (HCC) AKI  At the time of her discharge her volume has improved, with serum cr at 1,14, K at 3.9 and serum bicarbonate at 30  Na 140 and Mg 2,4 P 3,0   Plan to continue diuresis with torsemide, emapagliflozin and spironolactone Follow up renal function and electrolytes as outpatient   Essential hypertension Continue blood pressure support with midodrine. Continue diuresis with torsemide, emapgliflozin and spironolactone.   Type 2 diabetes mellitus with hyperlipidemia (HCC) Uncontrolled with hyperglycemia.  Steroid induced hyperglycemia.   Patient was placed on insulin therapy, basal, pre meal and sliding scale with good toleration.  At the time of her discharge her fasting glucose is 103 mg/dl.  She will resume her home diabetic regimen.  Continue with statin therapy.   Protein-calorie malnutrition, severe  (HCC) Continue nutritional supplements.          Consultants: cardiology  Procedures performed: none   Disposition: Home Diet recommendation:  Cardiac and Carb modified diet DISCHARGE MEDICATION: Allergies as of 02/19/2024       Reactions   Lactose Intolerance (gi) Diarrhea   Codeine Nausea And Vomiting        Medication List     TAKE these medications    albuterol 108 (90 Base) MCG/ACT inhaler Commonly known as: VENTOLIN HFA Inhale 2 puffs into the lungs every 6 (six) hours as needed for wheezing or shortness of breath.   atorvastatin 80 MG tablet Commonly known as: LIPITOR Take 1 tablet (80 mg total) by mouth daily.   Corlanor 7.5 MG Tabs tablet Generic drug: ivabradine TAKE 1 TABLET BY MOUTH TWICE A DAY WITH FOOD   cyclobenzaprine 10 MG tablet Commonly known as: FLEXERIL Take 1 tablet (10 mg total) by mouth 3 (three) times daily as needed for muscle spasms. What changed:  how much to take when to take this   digoxin 0.125 MG tablet Commonly known as: LANOXIN TAKE 1 TABLET BY MOUTH DAILY What changed: when to take this   Eliquis 2.5 MG Tabs tablet Generic drug: apixaban TAKE 1 TABLET BY MOUTH TWICE A DAY   Ensure Original Liqd Take 1 Bottle by mouth 2 (two) times daily after a meal. Drink 1 bottle 2 times daily.   Farxiga 10 MG Tabs tablet Generic drug:  dapagliflozin propanediol TAKE 1 TABLET BY MOUTH EVERY DAY   hydrOXYzine 25 MG capsule Commonly known as: VISTARIL TAKE 1 CAPSULE (25 MG TOTAL) BY MOUTH DAILY AS NEEDED.   ipratropium-albuterol 0.5-2.5 (3) MG/3ML Soln Commonly known as: DUONEB Take 3 mLs by nebulization every 4 (four) hours as needed (for shortness of breath).   megestrol 20 MG tablet Commonly known as: MEGACE Take 1 tablet (20 mg total) by mouth 2 (two) times daily.   metolazone 2.5 MG tablet Commonly known as: ZAROXOLYN Take 1 tablet (2.5 mg total) by mouth once a week. on Tuesday starting 01/15/2024 with 40 mEq of  Potassium 3 times per day on metolazone days   midodrine 5 MG tablet Commonly known as: PROAMATINE Take 1 tablet (5 mg total) by mouth 3 (three) times daily with meals.   nitroGLYCERIN 0.4 MG SL tablet Commonly known as: NITROSTAT Place 1 tablet (0.4 mg total) under the tongue every 5 (five) minutes as needed for chest pain.   potassium chloride 20 MEQ packet Commonly known as: KLOR-CON Take 40 mEq by mouth once a week. TAKE ONLY ON TUESDAY:  Dissolve two ( ) packets in 4-6oz of fluid three times a day when taking Metolazone 2.5mg .   spironolactone 25 MG tablet Commonly known as: ALDACTONE Take 1 tablet (25 mg total) by mouth at bedtime.   torsemide 20 MG tablet Commonly known as: DEMADEX Take 4 tablets (80 mg total) by mouth 2 (two) times daily.   Trelegy Ellipta 200-62.5-25 MCG/ACT Aepb Generic drug: Fluticasone-Umeclidin-Vilant Inhale 1 puff into the lungs daily.               Durable Medical Equipment  (From admission, onward)           Start     Ordered   02/18/24 1602  For home use only DME Shower stool  Once        02/18/24 1601            Follow-up Information     [facility] Follow up.   Why: shower stool Contact information: [phone]               Discharge Exam: Filed Weights   02/17/24 0500 02/18/24 0500 02/19/24 0437  Weight: 44.5 kg 44.3 kg 44.5 kg   BP 119/74 (BP Location: Right Arm)   Pulse 100   Temp 98.5 F (36.9 C) (Oral)   Resp 17   Ht 4' 11.5" (1.511 m)   Wt 44.5 kg   SpO2 97%   BMI 19.50 kg/m   Patient with no chest pain, no PND or orthopnea, her dyspnea is back to her baseline. She feels comfortable going home today.   Neurology awake and alert ENT with mild pallor Cardiovascular with S1 and S2 present and regular with no gallops, or rubs, positive systolic murmur at the right lower sternal border Respiratory with prolonged expiratory phase with poor inspiratory effort, she has bilateral rhonchi with no  wheezing or significant rales Abdomen with no distention  No lower extremity edema   Condition at discharge: stable  The results of significant diagnostics from this hospitalization (including imaging, microbiology, ancillary and laboratory) are listed below for reference.   Imaging Studies: DG CHEST PORT 1 VIEW Result Date: 02/16/2024 CLINICAL DATA:  Dyspnea EXAM: PORTABLE CHEST 1 VIEW COMPARISON:  02/14/2024 FINDINGS: The lungs are symmetrically, mildly hyperinflated in keeping with changes of underlying COPD. Cicatricial changes within the right hilar region correspond to post radiation changes better appreciated  on prior CT examination 02/15/2024. Developing interstitial pulmonary infiltrate, however, likely reflects progressive, trace interstitial pulmonary edema. No pneumothorax. Trace right pleural effusion is unchanged. Coronary artery bypass grafting has been performed. Stable cardiomegaly. Left subclavian single lead pacemaker defibrillator is unchanged. Neurostimulator battery pack overlies the right hemithorax. No acute bone abnormality. IMPRESSION: 1. Progressive, trace interstitial pulmonary edema. 2. Stable cardiomegaly. 3. COPD. 4. Post radiation changes within right hilum, better appreciated on prior CT examination of 02/15/2024. Electronically Signed   By: Helyn Numbers M.D.   On: 02/16/2024 02:53   CT CHEST WO CONTRAST Result Date: 02/15/2024 CLINICAL DATA:  COPD exacerbation. EXAM: CT CHEST WITHOUT CONTRAST TECHNIQUE: Multidetector CT imaging of the chest was performed following the standard protocol without IV contrast. RADIATION DOSE REDUCTION: This exam was performed according to the departmental dose-optimization program which includes automated exposure control, adjustment of the mA and/or kV according to patient size and/or use of iterative reconstruction technique. COMPARISON:  05/02/2023 FINDINGS: Cardiovascular: The heart is enlarged. No substantial pericardial effusion.  Status post CABG. Coronary artery calcification is evident. Mild atherosclerotic calcification is noted in the wall of the thoracic aorta. Left-sided pacemaker/AICD noted. Mediastinum/Nodes: 2.5 cm right thyroid nodule evident. No mediastinal lymphadenopathy. Soft tissue fullness noted right hilar region, not well evaluated on noncontrast imaging but similar to prior. The esophagus has normal imaging features. There is no axillary lymphadenopathy. Lungs/Pleura: Centrilobular and paraseptal emphysema evident. Similar right middle lobe collapse with abnormal confluent amorphous soft tissue in the right hilar region encasing upper lobe and middle lobe bronchi as well as some involvement of the central right lower lobe airways. Imaging features are similar to prior stated he. 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 No new focal airspace consolidation. No pulmonary edema or pleural effusion. Upper Abdomen: Visualized portion of the upper abdomen shows no acute findings. Stomach is distended with food and gas. Musculoskeletal: No worrisome lytic or sclerotic osseous abnormality. IMPRESSION: 1. No pulmonary edema or new focal lung consolidation. 2. Presumed post radiation scarring in the right hilar region. Mass-effect on central airways to the right upper and lower lobes is more pronounced than on the previous study. Close follow-up recommended and PET-CT may be warranted as recurrence cannot be excluded. 3. 2.5 cm right thyroid nodule. Recommend thyroid US (ref: J Am Coll Radiol. 2015 Feb;12(2): 143-50). 4.  Emphysema (ICD10-J43.9) and Aortic Atherosclerosis (ICD10-170.0) Electronically Signed   By: Kennith Center M.D.   On: 02/15/2024 07:16   DG Chest Port 1 View Result Date: 02/15/2024 CLINICAL DATA:   Shortness of breath. EXAM: PORTABLE CHEST 1 VIEW COMPARISON:  December 18, 2023 FINDINGS: There is stable single lead ventricular pacer positioning. Multiple sternal wires and vascular clips are seen. A radiopaque stimulator and associated stimulator wire is noted. The heart size and mediastinal contours are within normal limits. There is no evidence of acute infiltrate or pneumothorax. A small right pleural effusion is noted. The visualized skeletal structures are unremarkable. IMPRESSION: 1. Evidence of prior median sternotomy/CABG. 2. Small right pleural effusion. Electronically Signed   By: Aram Candela M.D.   On: 02/15/2024 00:11   CUP PACEART INCLINIC DEVICE CHECK Result Date: 01/28/2024 Normal in-clinic ICD check. Thresholds, sensing, and impedance WNL or stable for patient over time. No new episodes. Estimated longevity 44yr51mo . Pt enrolled  in remote follow-up.   Microbiology: Results for orders placed or performed during the hospital encounter of 02/14/24  Blood culture (routine x 2)     Status: None   Collection Time: 02/14/24 10:03 PM   Specimen: BLOOD  Result Value Ref Range Status   Specimen Description BLOOD SITE NOT SPECIFIED  Final   Special Requests   Final    BOTTLES DRAWN AEROBIC AND ANAEROBIC Blood Culture results may not be optimal due to an inadequate volume of blood received in culture bottles   Culture   Final    NO GROWTH 5 DAYS Performed at [facility], [address], [place], [place] [address]    Report Status 02/19/2024 FINAL  Final  Blood culture (routine x 2)     Status: None   Collection Time: 02/14/24 10:03 PM   Specimen: BLOOD  Result Value Ref Range Status   Specimen Description BLOOD SITE NOT SPECIFIED  Final   Special Requests   Final    BOTTLES DRAWN AEROBIC AND ANAEROBIC Blood Culture results may not be optimal due to an inadequate volume of blood received in culture bottles   Culture   Final    NO GROWTH 5 DAYS Performed at [facility], [address], [place], [place] [address]    Report Status 02/19/2024 FINAL  Final  Resp panel by RT-PCR (RSV, Flu A&B, Covid)     Status: None   Collection Time: 02/14/24 10:03 PM   Specimen: Nasal Swab  Result Value Ref Range Status   SARS Coronavirus 2 by RT PCR NEGATIVE NEGATIVE Final   Influenza A by PCR NEGATIVE NEGATIVE Final   Influenza B by PCR NEGATIVE NEGATIVE Final    Comment: (NOTE) The Xpert Xpress SARS-CoV-2/FLU/RSV plus assay is intended as an aid in the diagnosis of influenza from Nasopharyngeal swab specimens and should not be used as a sole basis for treatment. Nasal washings and aspirates are unacceptable for Xpert Xpress SARS-CoV-2/FLU/RSV testing.  Fact Sheet for Patients: BloggerCourse.com  Fact Sheet for Healthcare Providers: SeriousBroker.it  This test is not yet approved or cleared by the [place] FDA and has been authorized for detection and/or diagnosis of SARS-CoV-2 by FDA under an Emergency Use Authorization (EUA). This EUA will remain in effect (meaning this test can be used) for the duration of the COVID-19 declaration under Section 564(b)(1) of the Act, 21 U.S.C. section 360bbb-3(b)(1), unless the authorization is terminated or revoked.     Resp Syncytial Virus by PCR NEGATIVE NEGATIVE Final    Comment: (NOTE) Fact Sheet for Patients: BloggerCourse.com  Fact Sheet for Healthcare Providers: SeriousBroker.it  This test is not yet approved or cleared by the [place] FDA and has been authorized for detection and/or diagnosis of SARS-CoV-2 by FDA under an Emergency Use Authorization (EUA). This EUA will remain in effect (meaning this test can be used) for the duration of the COVID-19 declaration under Section 564(b)(1) of the Act, 21 U.S.C. section 360bbb-3(b)(1), unless the authorization is terminated or revoked.  Performed  at [facility], [address], [place], [place] [address]    *Note: Due to a large number of results and/or encounters for the requested time period, some results have not been displayed. A complete set of results can be found in Results Review.    Labs: CBC: Recent Labs  Lab 02/14/24 2132 02/14/24 2210 02/15/24 1540 02/16/24 0234 02/17/24 1118  WBC 10.6*  --  8.1 12.3* 12.6*  NEUTROABS  7.7  --   --  10.9*  --   HGB 12.1 13.3 11.7* 11.1* 10.4*  HCT 38.9 39.0 36.5 34.9* 31.7*  MCV 91.3  --  87.7 88.4 88.3  PLT 202  --  184 181 211   Basic Metabolic Panel: Recent Labs  Lab 02/15/24 1540 02/16/24 0234 02/17/24 0350 02/18/24 0250 02/19/24 0726  NA 135 136 137 137 140  K 4.1 3.9 4.0 3.9 3.9  CL 98 100 102 100 103  CO2 23 21* 24 27 30   GLUCOSE 294* 329* 242* 231* 103*  BUN 31* 38* 48* 45* 38*  CREATININE 1.36* 1.40* 1.46* 1.49* 1.14*  CALCIUM 9.9 9.8 9.9 9.5 9.6  MG  --  2.1 2.4 2.4  --   PHOS  --   --   --   --  3.0   Liver Function Tests: Recent Labs  Lab 02/19/24 0726  ALBUMIN 3.1*   CBG: Recent Labs  Lab 02/18/24 1109 02/18/24 1612 02/18/24 2108 02/19/24 0621 02/19/24 1136  GLUCAP 116* 243* 280* 134* 243*    Discharge time spent: greater than 30 minutes.  Signed: Coralie Keens, MD [facility] 02/19/2024

## 2024-02-19 NOTE — Progress Notes (Signed)
 Discharge instructions reviewed with pt. Copy of instructions given to pt. No new scripts, no changes in medications. Pt has called for her ride and will be here in 10 minutes, her ride will call the pt when she arrives at the entrance to pick pt up.  Pt will be d/c'd via wheelchair with belongings and will be escorted by staff.

## 2024-02-19 NOTE — Plan of Care (Signed)
 Problem: Education: Goal: Knowledge of General Education information will improve Description: Including pain rating scale, medication(s)/side effects and non-pharmacologic comfort measures Outcome: Completed/Met   Problem: Health Behavior/Discharge Planning: Goal: Ability to manage health-related needs will improve Outcome: Completed/Met   Problem: Clinical Measurements: Goal: Ability to maintain clinical measurements within normal limits will improve Outcome: Completed/Met Goal: Will remain free from infection Outcome: Completed/Met Goal: Diagnostic test results will improve Outcome: Completed/Met Goal: Respiratory complications will improve Outcome: Completed/Met Goal: Cardiovascular complication will be avoided Outcome: Completed/Met   Problem: Activity: Goal: Risk for activity intolerance will decrease Outcome: Completed/Met   Problem: Nutrition: Goal: Adequate nutrition will be maintained Outcome: Completed/Met   Problem: Coping: Goal: Level of anxiety will decrease Outcome: Completed/Met   Problem: Elimination: Goal: Will not experience complications related to bowel motility Outcome: Completed/Met Goal: Will not experience complications related to urinary retention Outcome: Completed/Met   Problem: Pain Managment: Goal: General experience of comfort will improve and/or be controlled Outcome: Completed/Met   Problem: Safety: Goal: Ability to remain free from injury will improve Outcome: Completed/Met   Problem: Skin Integrity: Goal: Risk for impaired skin integrity will decrease Outcome: Completed/Met   Problem: Education: Goal: Ability to demonstrate management of disease process will improve Outcome: Completed/Met Goal: Ability to verbalize understanding of medication therapies will improve Outcome: Completed/Met Goal: Individualized Educational Video(s) Outcome: Completed/Met   Problem: Activity: Goal: Capacity to carry out activities will  improve Outcome: Completed/Met   Problem: Cardiac: Goal: Ability to achieve and maintain adequate cardiopulmonary perfusion will improve Outcome: Completed/Met   Problem: Education: Goal: Knowledge of disease or condition will improve Outcome: Completed/Met Goal: Knowledge of the prescribed therapeutic regimen will improve Outcome: Completed/Met Goal: Individualized Educational Video(s) Outcome: Completed/Met   Problem: Activity: Goal: Ability to tolerate increased activity will improve Outcome: Completed/Met Goal: Will verbalize the importance of balancing activity with adequate rest periods Outcome: Completed/Met   Problem: Respiratory: Goal: Ability to maintain a clear airway will improve Outcome: Completed/Met Goal: Levels of oxygenation will improve Outcome: Completed/Met Goal: Ability to maintain adequate ventilation will improve Outcome: Completed/Met   Problem: Education: Goal: Ability to describe self-care measures that may prevent or decrease complications (Diabetes Survival Skills Education) will improve Outcome: Completed/Met Goal: Individualized Educational Video(s) Outcome: Completed/Met   Problem: Coping: Goal: Ability to adjust to condition or change in health will improve Outcome: Completed/Met   Problem: Fluid Volume: Goal: Ability to maintain a balanced intake and output will improve Outcome: Completed/Met   Problem: Health Behavior/Discharge Planning: Goal: Ability to identify and utilize available resources and services will improve Outcome: Completed/Met Goal: Ability to manage health-related needs will improve Outcome: Completed/Met   Problem: Metabolic: Goal: Ability to maintain appropriate glucose levels will improve Outcome: Completed/Met   Problem: Nutritional: Goal: Maintenance of adequate nutrition will improve Outcome: Completed/Met Goal: Progress toward achieving an optimal weight will improve Outcome: Completed/Met   Problem:  Skin Integrity: Goal: Risk for impaired skin integrity will decrease Outcome: Completed/Met   Problem: Tissue Perfusion: Goal: Adequacy of tissue perfusion will improve Outcome: Completed/Met   Problem: Increased Nutrient Needs (NI-5.1) Goal: Food and/or nutrient delivery Description: Individualized approach for food/nutrient provision. Outcome: Completed/Met   Problem: Acute Rehab PT Goals(only PT should resolve) Goal: Patient Will Transfer Sit To/From Stand Outcome: Completed/Met Goal: Pt Will Ambulate Outcome: Completed/Met Goal: Pt Will Go Up/Down Stairs Outcome: Completed/Met   Problem: Acute Rehab OT Goals (only OT should resolve) Goal: Pt. Will Perform Grooming Outcome: Completed/Met Goal: Pt. Will Perform Lower Body Bathing Outcome:  Completed/Met Goal: Pt. Will Perform Lower Body Dressing Outcome: Completed/Met Goal: Pt. Will Transfer To Toilet Outcome: Completed/Met Goal: Pt. Will Perform Toileting-Clothing Manipulation Outcome: Completed/Met Goal: Pt. Will Perform Tub/Shower Transfer Outcome: Completed/Met

## 2024-02-19 NOTE — Care Management Important Message (Signed)
 Important Message  Patient Details  Name: Brittany [name] MRN: [id number] Date of Birth: 08/15/1961   Important Message Given:  Yes - Medicare IM     Renie Ora 02/19/2024, 10:18 AM

## 2024-02-20 ENCOUNTER — Encounter (HOSPITAL_COMMUNITY): Payer: Medicare Other

## 2024-02-20 ENCOUNTER — Telehealth: Payer: Self-pay

## 2024-02-20 NOTE — Transitions of Care (Post Inpatient/ED Visit) (Signed)
   02/20/2024  Name: Brittany [name] MRN: [id number] DOB: Oct 01, 1961  Today's TOC FU Call Status: Today's TOC FU Call Status:: Successful TOC FU Call Completed TOC FU Call Complete Date: 02/20/24 Patient's Name and Date of Birth confirmed.  Transition Care Management Follow-up Telephone Call Date of Discharge: 02/19/24 Discharge Facility: [facility]) Type of Discharge: Inpatient Admission Primary Inpatient Discharge Diagnosis:: acute on chronic systolic CHF How have you been since you were released from the hospital?: Better (She said she is resting, taking things slowly) Any questions or concerns?: No  Items Reviewed: Did you receive and understand the discharge instructions provided?: Yes Medications obtained,verified, and reconciled?: No Medications Not Reviewed Reasons:: Other: Reason for Partial Mediation Review: She stated she has all medications as well as a nebulizer and she did not have any questions about the med regime and did not need to review the list.   No changes were made with her med regime when she was discharged Any new allergies since your discharge?: No Dietary orders reviewed?: Yes Type of Diet Ordered:: heart healthy, low sodium, carb modified. Do you have support at home?: Yes People in Home: alone Name of Support/Comfort Primary Source: she said she has alot of family/friends that assist her as needed  Medications Reviewed Today: Medications Reviewed Today   Medications were not reviewed in this encounter     Home Care and Equipment/Supplies: Were Home Health Services Ordered?: No Any new equipment or medical supplies ordered?: Yes Name of Medical supply agency?: [employer]- shower chair Were you able to get the equipment/medical supplies?: Yes Do you have any questions related to the use of the equipment/supplies?: No  Functional Questionnaire: Do you need assistance with bathing/showering or dressing?: No Do you need assistance with meal  preparation?: No Do you need assistance with eating?: No Do you have difficulty maintaining continence: No Do you need assistance with getting out of bed/getting out of a chair/moving?: No (She said she has a rollator to use if needed.  She also has O2 that she uses @ 2L when needed and a BiPAP that is also used as needed,) Do you have difficulty managing or taking your medications?: No  Follow up appointments reviewed: PCP Follow-up appointment confirmed?: Yes Date of PCP follow-up appointment?: 02/26/24 Follow-up Provider: Dr Andrey Campanile [facility] Follow-up appointment confirmed?: Yes Date of Specialist follow-up appointment?: 03/13/24 Follow-Up Specialty Provider:: [facility]- CHF Do you need transportation to your follow-up appointment?: No Do you understand care options if your condition(s) worsen?: Yes-patient verbalized understanding    SIGNATURE Robyne Peers, RN

## 2024-02-22 ENCOUNTER — Other Ambulatory Visit: Payer: Self-pay | Admitting: Family Medicine

## 2024-02-22 ENCOUNTER — Other Ambulatory Visit: Payer: Self-pay

## 2024-02-25 ENCOUNTER — Other Ambulatory Visit (HOSPITAL_COMMUNITY): Payer: Self-pay | Admitting: Family Medicine

## 2024-02-26 ENCOUNTER — Ambulatory Visit (INDEPENDENT_AMBULATORY_CARE_PROVIDER_SITE_OTHER): Payer: Medicare Other | Admitting: Family Medicine

## 2024-02-26 ENCOUNTER — Other Ambulatory Visit (HOSPITAL_COMMUNITY): Payer: Self-pay | Admitting: Cardiology

## 2024-02-26 VITALS — BP 107/70 | HR 100 | Temp 97.8°F | Resp 16 | Ht 59.5 in | Wt 97.6 lb

## 2024-02-26 DIAGNOSIS — I5022 Chronic systolic (congestive) heart failure: Secondary | ICD-10-CM

## 2024-02-26 DIAGNOSIS — R634 Abnormal weight loss: Secondary | ICD-10-CM

## 2024-02-26 DIAGNOSIS — Z09 Encounter for follow-up examination after completed treatment for conditions other than malignant neoplasm: Secondary | ICD-10-CM | POA: Diagnosis not present

## 2024-02-26 DIAGNOSIS — J42 Unspecified chronic bronchitis: Secondary | ICD-10-CM | POA: Diagnosis not present

## 2024-02-26 MED ORDER — IVABRADINE HCL 7.5 MG PO TABS: 7.5000 mg | ORAL_TABLET | Freq: Two times a day (BID) | ORAL | 6 refills | Status: DC

## 2024-02-26 MED ORDER — MEGESTROL ACETATE 20 MG PO TABS: 20.0000 mg | ORAL_TABLET | Freq: Two times a day (BID) | ORAL | 1 refills | Status: DC

## 2024-02-27 ENCOUNTER — Encounter: Payer: Self-pay | Admitting: Family Medicine

## 2024-02-27 MED ORDER — TRELEGY ELLIPTA 200-62.5-25 MCG/ACT IN AEPB: 1.0000 | INHALATION_SPRAY | Freq: Every day | RESPIRATORY_TRACT | 0 refills | Status: DC

## 2024-02-27 NOTE — Progress Notes (Signed)
 Established Patient Office Visit  Subjective    Patient ID: Brittany [name], female    DOB: Mar 08, 1961  Age: 63 y.o. MRN: [id number]  CC:  Chief Complaint  Patient presents with   Follow-up    6 week    HPI Brittany [name] presents for routine hospital discharge for which she had been admitted with dx of CHF and COPD. Patient reports improvements since she has been discharged and she is nearing her baseline. She also reports improvement in her appetitie and gaining of with with present management.   Outpatient Encounter Medications as of 02/26/2024  Medication Sig   albuterol (VENTOLIN HFA) 108 (90 Base) MCG/ACT inhaler Inhale 2 puffs into the lungs every 6 (six) hours as needed for wheezing or shortness of breath.   atorvastatin (LIPITOR) 80 MG tablet Take 1 tablet (80 mg total) by mouth daily.   cyclobenzaprine (FLEXERIL) 10 MG tablet Take 1 tablet (10 mg total) by mouth 3 (three) times daily as needed for muscle spasms. (Patient taking differently: Take 0.5 mg by mouth daily as needed for muscle spasms.)   digoxin (LANOXIN) 0.125 MG tablet TAKE 1 TABLET BY MOUTH DAILY (Patient taking differently: Take 125 mcg by mouth at bedtime.)   ELIQUIS 2.5 MG TABS tablet TAKE 1 TABLET BY MOUTH TWICE A DAY   FARXIGA 10 MG TABS tablet TAKE 1 TABLET BY MOUTH EVERY DAY   hydrOXYzine (VISTARIL) 25 MG capsule TAKE 1 CAPSULE (25 MG TOTAL) BY MOUTH DAILY AS NEEDED.   ipratropium-albuterol (DUONEB) 0.5-2.5 (3) MG/3ML SOLN TAKE 3 MLS BY NEBULIZATION EVERY 4 (FOUR) HOURS AS NEEDED (FOR SHORTNESS OF BREATH).   metolazone (ZAROXOLYN) 2.5 MG tablet Take 1 tablet (2.5 mg total) by mouth once a week. on Tuesday starting 01/15/2024 with 40 mEq of Potassium 3 times per day on metolazone days   nitroGLYCERIN (NITROSTAT) 0.4 MG SL tablet Place 1 tablet (0.4 mg total) under the tongue every 5 (five) minutes as needed for chest pain.   Nutritional Supplements (ENSURE ORIGINAL) LIQD Take 1 Bottle by  mouth 2 (two) times daily after a meal. Drink 1 bottle 2 times daily.   potassium chloride (KLOR-CON) 20 MEQ packet Take 40 mEq by mouth once a week. TAKE ONLY ON TUESDAY:  Dissolve two ( ) packets in 4-6oz of fluid three times a day when taking Metolazone 2.5mg .   spironolactone (ALDACTONE) 25 MG tablet Take 1 tablet (25 mg total) by mouth at bedtime.   torsemide (DEMADEX) 20 MG tablet Take 4 tablets (80 mg total) by mouth 2 (two) times daily.   [DISCONTINUED] CORLANOR 7.5 MG TABS tablet TAKE 1 TABLET BY MOUTH TWICE A DAY WITH FOOD   [DISCONTINUED] megestrol (MEGACE) 20 MG tablet Take 1 tablet (20 mg total) by mouth 2 (two) times daily.   Fluticasone-Umeclidin-Vilant (TRELEGY ELLIPTA) 200-62.5-25 MCG/ACT AEPB Inhale 1 puff into the lungs daily.   megestrol (MEGACE) 20 MG tablet Take 1 tablet (20 mg total) by mouth 2 (two) times daily.   midodrine (PROAMATINE) 5 MG tablet Take 1 tablet (5 mg total) by mouth 3 (three) times daily with meals. (Patient not taking: Reported on 02/15/2024)   [DISCONTINUED] Fluticasone-Umeclidin-Vilant (TRELEGY ELLIPTA) 200-62.5-25 MCG/ACT AEPB Inhale 1 puff into the lungs daily.   No facility-administered encounter medications on file as of 02/26/2024.    Past Medical History:  Diagnosis Date   Acute respiratory failure (HCC) 05/05/2018   Acute systolic congestive heart failure (HCC) 02/03/2018   AICD (automatic cardioverter/defibrillator) present    Allergy  Anxiety    Asthma    DM2 (diabetes mellitus, type 2) (HCC) 10/20/2020   Hypertension    PAF (paroxysmal atrial fibrillation) (HCC)    Presence of permanent cardiac pacemaker    Prophylactic measure 08/03/14-08/19/14   Prophyl. 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   LIMA to LAD, SVG to D1, SVG to OM1, EVH via right thigh with implantation of Impella LD LVAD via direct aortic approach   Small cell lung cancer (HCC) 03/16/2014    Past Surgical History:  Procedure Laterality Date    BRONCHIAL BRUSHINGS  10/25/2020   Procedure: BRONCHIAL BRUSHINGS;  Surgeon: Leslye Peer, MD;  Location: [facility] ENDOSCOPY;  Service: Pulmonary;;   BRONCHIAL NEEDLE ASPIRATION BIOPSY  10/25/2020   Procedure: BRONCHIAL NEEDLE ASPIRATION BIOPSIES;  Surgeon: Leslye Peer, MD;  Location: [facility] ENDOSCOPY;  Service: Pulmonary;;   CARDIAC DEFIBRILLATOR PLACEMENT  08/15/2018   MDT Visia AF MRI VR ICD implanted by Dr Georgena Spurling for primary prevention of sudden   CESAREAN SECTION     CORONARY ARTERY BYPASS GRAFT N/A 02/03/2018   Procedure: CORONARY ARTERY BYPASS GRAFTING (CABG);  Surgeon: Purcell Nails, MD;  Location: [facility] OR;  Service: Open Heart Surgery;  Laterality: N/A;  Time 3 using left internal mammary artery and endoscopically harvested right saphenous vein   CORONARY BALLOON ANGIOPLASTY N/A 02/03/2018   Procedure: CORONARY BALLOON ANGIOPLASTY;  Surgeon: Swaziland, Peter M, MD;  Location: [facility] INVASIVE CV LAB;  Service: Cardiovascular;  Laterality: N/A;   CORONARY STENT INTERVENTION N/A 02/12/2019   Procedure: CORONARY STENT INTERVENTION;  Surgeon: Iran Ouch, MD;  Location: [facility] INVASIVE CV LAB;  Service: Cardiovascular;  Laterality: N/A;   CORONARY/GRAFT ACUTE MI REVASCULARIZATION N/A 02/03/2018   Procedure: Coronary/Graft Acute MI Revascularization;  Surgeon: Swaziland, Peter M, MD;  Location: [facility] INVASIVE CV LAB;  Service: Cardiovascular;  Laterality: N/A;   ENDOBRONCHIAL ULTRASOUND N/A 10/25/2020   Procedure: ENDOBRONCHIAL ULTRASOUND;  Surgeon: Leslye Peer, MD;  Location: [facility] ENDOSCOPY;  Service: Pulmonary;  Laterality: N/A;   FLEXIBLE BRONCHOSCOPY  10/25/2020   Procedure: FLEXIBLE BRONCHOSCOPY;  Surgeon: Leslye Peer, MD;  Location: [facility] ENDOSCOPY;  Service: Pulmonary;;   IABP INSERTION N/A 02/03/2018   Procedure: IABP Insertion;  Surgeon: Swaziland, Peter M, MD;  Location: [facility] INVASIVE CV LAB;  Service: Cardiovascular;  Laterality: N/A;   INTRAOPERATIVE TRANSESOPHAGEAL ECHOCARDIOGRAM N/A 02/03/2018    Procedure: INTRAOPERATIVE TRANSESOPHAGEAL ECHOCARDIOGRAM;  Surgeon: Purcell Nails, MD;  Location: [facility] OR;  Service: Open Heart Surgery;  Laterality: N/A;   LEFT HEART CATH AND CORONARY ANGIOGRAPHY N/A 02/03/2018   Procedure: LEFT HEART CATH AND CORONARY ANGIOGRAPHY;  Surgeon: Swaziland, Peter M, MD;  Location: [facility] INVASIVE CV LAB;  Service: Cardiovascular;  Laterality: N/A;   LEFT HEART CATH AND CORS/GRAFTS ANGIOGRAPHY N/A 02/12/2019   Procedure: LEFT HEART CATH AND CORS/GRAFTS ANGIOGRAPHY;  Surgeon: Iran Ouch, MD;  Location: [facility] INVASIVE CV LAB;  Service: Cardiovascular;  Laterality: N/A;   MEDIASTINOSCOPY N/A 03/11/2014   Procedure: MEDIASTINOSCOPY;  Surgeon: Loreli Slot, MD;  Location: [facility] OR;  Service: Thoracic;  Laterality: N/A;   PLACEMENT OF IMPELLA LEFT VENTRICULAR ASSIST DEVICE  02/03/2018   Procedure: PLACEMENT OF IMPELLA LEFT VENTRICULAR ASSIST DEVICE LD;  Surgeon: Purcell Nails, MD;  Location: [facility] OR;  Service: Open Heart Surgery;;   REMOVAL OF IMPELLA LEFT VENTRICULAR ASSIST DEVICE N/A 02/08/2018   Procedure: REMOVAL OF IMPELLA LEFT VENTRICULAR ASSIST DEVICE;  Surgeon: Purcell Nails, MD;  Location: [facility] OR;  Service: Open Heart Surgery;  Laterality: N/A;   RIGHT HEART CATH N/A 02/03/2018   Procedure: RIGHT HEART CATH;  Surgeon: Swaziland, Peter M, MD;  Location: [facility] INVASIVE CV LAB;  Service: Cardiovascular;  Laterality: N/A;   RIGHT HEART CATH N/A 05/09/2018   Procedure: RIGHT HEART CATH;  Surgeon: Dolores Patty, MD;  Location: [facility] INVASIVE CV LAB;  Service: Cardiovascular;  Laterality: N/A;   TEE WITHOUT CARDIOVERSION N/A 02/08/2018   Procedure: TRANSESOPHAGEAL ECHOCARDIOGRAM (TEE);  Surgeon: Purcell Nails, MD;  Location: [facility] OR;  Service: Open Heart Surgery;  Laterality: N/A;   TUBAL LIGATION     VIDEO BRONCHOSCOPY WITH ENDOBRONCHIAL ULTRASOUND N/A 03/11/2014   Procedure: VIDEO BRONCHOSCOPY WITH ENDOBRONCHIAL ULTRASOUND;  Surgeon: Loreli Slot, MD;  Location: [facility] OR;   Service: Thoracic;  Laterality: N/A;    Family History  Problem Relation Age of Onset   Heart disease Mother    Hypertension Mother    Heart attack Mother    Hypertension Maternal Grandmother    Cancer Maternal Grandmother    Diabetes Paternal Grandmother    Stroke Neg Hx     Social History   Socioeconomic History   Marital status: Married    Spouse name: Tristan Schroeder    Number of children: 1   Years of education: Not on file   Highest education level: Not on file  Occupational History   Occupation: disabled  Tobacco Use   Smoking status: Former    Current packs/day: 0.00    Average packs/day: 1 pack/day for 20.0 years (20.0 ttl pk-yrs)    Types: Cigarettes    Start date: 03/13/1994    Quit date: 03/13/2014    Years since quitting: 9.9   Smokeless tobacco: Never  Vaping Use   Vaping status: Never Used  Substance and Sexual Activity   Alcohol use: Yes    Alcohol/week: 5.0 standard drinks of alcohol    Types: 5 Glasses of wine per week    Comment: a few glasses of wine weekly   Drug use: Yes    Types: Marijuana    Comment: "medicinal", not daily   Sexual activity: Never  Other Topics Concern   Not on file  Social History Narrative   Not on file   Social Drivers of Health   Financial Resource Strain: Medium Risk (02/11/2021)   Overall Financial Resource Strain (CARDIA)    Difficulty of Paying Living Expenses: Somewhat hard  Food Insecurity: No Food Insecurity (02/15/2024)   Hunger Vital Sign    Worried About Running Out of Food in the Last Year: Never true    Ran Out of Food in the Last Year: Never true  Transportation Needs: No Transportation Needs (02/15/2024)   PRAPARE - Administrator, Civil Service (Medical): No    Lack of Transportation (Non-Medical): No  Recent Concern: Transportation Needs - Unmet Transportation Needs (12/22/2023)   PRAPARE - Transportation    Lack of Transportation (Medical): Yes    Lack of Transportation (Non-Medical): Yes   Physical Activity: Insufficiently Active (02/11/2021)   Exercise Vital Sign    Days of Exercise per Week: 2 days    Minutes of Exercise per Session: 10 min  Stress: Stress Concern Present (02/11/2021)   [employer] of Occupational Health - Occupational Stress Questionnaire    Feeling of Stress : To some extent  Social Connections: Unknown (02/11/2021)   Social Connection and Isolation Panel [NHANES]    Frequency of Communication with  Friends and Family: More than three times a week    Frequency of Social Gatherings with Friends and Family: Twice a week    Attends Religious Services: Not on Marketing executive or Organizations: Not on file    Attends Banker Meetings: Never    Marital Status: Married  Catering manager Violence: Not At Risk (02/15/2024)   Humiliation, Afraid, Rape, and Kick questionnaire    Fear of Current or Ex-Partner: No    Emotionally Abused: No    Physically Abused: No    Sexually Abused: No    Review of Systems  All other systems reviewed and are negative.       Objective    BP 107/70   Pulse 100   Temp 97.8 F (36.6 C) (Oral)   Resp 16   Ht 4' 11.5" (1.511 m)   Wt 97 lb 9.6 oz (44.3 kg)   SpO2 98%   BMI 19.38 kg/m   Physical Exam Vitals and nursing note reviewed.  Constitutional:      General: She is not in acute distress. Cardiovascular:     Rate and Rhythm: Normal rate and regular rhythm.  Pulmonary:     Effort: Pulmonary effort is normal.     Breath sounds: Normal breath sounds.  Abdominal:     Palpations: Abdomen is soft.     Tenderness: There is no abdominal tenderness.  Neurological:     General: No focal deficit present.     Mental Status: She is alert and oriented to person, place, and time.         Assessment & Plan:   1. Hospital discharge follow-up (Primary)   2. Chronic systolic congestive heart failure (HCC) Appears stable. Management as per consultant  3. Chronic bronchitis, unspecified  chronic bronchitis type (HCC)  - Ambulatory referral to Pulmonology  4. Loss of weight Megace refilled    No follow-ups on file.   Tommie Raymond, MD

## 2024-02-28 ENCOUNTER — Telehealth: Payer: Self-pay

## 2024-02-28 NOTE — Telephone Encounter (Signed)
 Did the patient discuss referral with their provider in the last year? Yes        Appointment offered? No        Type of order/referral and detailed reason for visit: Pulmonologist, COPD        Preference of office, provider, location: [facility] at [facility] [address], [place], [place] [address]        If referral order, have you been seen by this specialty before? Yes    (If Yes, this issue or another issue? When? Where? Yes, same issue at [facility]        Can we respond through MyChart? Yes            Patient was not pleased with [facility]

## 2024-03-03 ENCOUNTER — Ambulatory Visit: Attending: Cardiology

## 2024-03-03 DIAGNOSIS — Z95810 Presence of automatic (implantable) cardiac defibrillator: Secondary | ICD-10-CM

## 2024-03-03 DIAGNOSIS — I5022 Chronic systolic (congestive) heart failure: Secondary | ICD-10-CM | POA: Diagnosis not present

## 2024-03-03 NOTE — Progress Notes (Unsigned)
 EPIC Encounter for ICM Monitoring  Patient Name: Brittany [name] is a 63 y.o. female Date: 03/03/2024 Primary Care Physican: Georganna Skeans, MD Primary Cardiologist: Bensimhon Electrophysiologist: Lalla Brothers 10/23/2023 Weight: 93-94 lbs 11/13/2023 Weight: 94 lbs  01/03/2024 Weight: 93-94 lbs  01/22/2024 Weight: 91 lbs (lowest weight 84 lbs)   Spoke with patient and heart failure questions reviewed.  Transmission results reviewed.  Pt is asymptomatic for fluid accumulation. She reports she is feeling well.   She limits salt intake.    Optivol Thoracic impedance suggesting possible fluid accumulation starting 3/1.    Prescribed: Torsemide 20 mg take 4 tablets (80 mg total) by mouth two times a day.  Potassium 20 mEq packet take 40 mEq total by mouth once a week on Tuesday.  Dissolve two (20 mEq ) packets in 4-6 oz of fluid three times a day when taking Metolazone 2.5 mg.   Spironolactone 25 mg take 1 tablet (25 mg total) by mouth at bedtime Metolazone 2.5 mg take 1 tablet by mouth once a week on Tuesdays with 40 mEq Potassium 3 times a day on Metolazone days   Labs: 02/19/2024 Creatinine 1.14, BUN 38, Potassium 3.9, Sodium 140, GFR 54  02/18/2024 Creatinine 1.49, BUN 45, Potassium 3.9, Sodium 137, GFR 39  02/17/2024 Creatinine 1.46, BUN 48, Potassium 4.0, Sodium 137, GFR 40  02/16/2024 Creatinine 1.40, BUN 38, Potassium 3.9, Sodium 136, GFR 42 02/15/2024 Creatinine 1.36, BUN 31, Potassium 4.1, Sodium 135, GFR 44  02/14/2024 Creatinine N/A, BUN N/A, Potassium 5.6, Sodium 137 (10:10 PM) 02/14/2024 Creatinine 1.20, BUN 27, Potassium 3.6, Sodium 142, GFR 51 (9:32 PM)  01/25/2024 Creatinine 1.53, BUN 48 ,Potassium 4.5, Sodium 137, GFR 38 01/14/2024 Creatinine 1.20, BUN 18, Potassium 3.2, Sodium 136, GFR 51 01/09/2024 Creatinine 1.53, BUN 31, Potassium 2.8, Sodium 133, GFR 38 A complete set of results can be found in Results Review.   Recommendations:  Confirmed she is compliant with  weekly Tuesday Metolazone and Torsemide 80 mg bid.  Copy sent to [name], NP for review and recommendations.     Follow-up plan: ICM clinic phone appointment on 03/10/2024 to recheck fluid levels.   91 day device clinic remote transmission 03/31/2024.     EP/Cardiology Office Visits:  03/13/2024 with HF Clinic.  Recall 07/26/2024 with Otilio Saber, PA   Copy of ICM check sent to Dr. Lalla Brothers.  3 month ICM trend: 03/03/2024.    12-14 Month ICM trend:     Karie Soda, RN 03/03/2024 11:25 AM

## 2024-03-04 MED ORDER — TORSEMIDE 20 MG PO TABS: 100.0000 mg | ORAL_TABLET | Freq: Two times a day (BID) | ORAL | Status: DC

## 2024-03-04 NOTE — Progress Notes (Signed)
 Spoke with patient.  Advised Brittany Rome, NP recommended to increase Torsemide to 100 mg twice a day.  Also take extra Metolazone on Friday this week the 28th with 40 mEq of Potassium. BMET Lab needed in 7 days which will be drawn on 4/3 at HF clinic OV.  Prescription updated and she has supply on hand.  Pt verbalized understanding and agreed with plan.   Advised to call back for any questions.

## 2024-03-04 NOTE — Progress Notes (Signed)
 Attempted call to patient to provide HF clinic recommendations and voice mail has not been set up.

## 2024-03-04 NOTE — Progress Notes (Signed)
  Received: Arlyce Harman, Anderson Malta, FNP  Ercie Eliasen, Josephine Igo, RN Increase torsemide to 100 mg bid, she will take metolazone 2.5 mg (with extra 40 KCL) 2x this week-Tues and Friday.  She will need a BMET in 1 week.

## 2024-03-10 ENCOUNTER — Ambulatory Visit: Attending: Cardiology

## 2024-03-10 DIAGNOSIS — Z95810 Presence of automatic (implantable) cardiac defibrillator: Secondary | ICD-10-CM

## 2024-03-10 DIAGNOSIS — I5022 Chronic systolic (congestive) heart failure: Secondary | ICD-10-CM

## 2024-03-10 NOTE — Progress Notes (Signed)
 EPIC Encounter for ICM Monitoring  Patient Name: Brittany [name] is a 63 y.o. female Date: 03/10/2024 Primary Care Physican: Georganna Skeans, MD Primary Cardiologist: Bensimhon Electrophysiologist: Lalla Brothers 10/23/2023 Weight: 93-94 lbs 11/13/2023 Weight: 94 lbs  01/03/2024 Weight: 93-94 lbs  01/22/2024 Weight: 91 lbs (lowest weight 84 lbs)   Spoke with patient and heart failure questions reviewed.  Transmission results reviewed.  Pt is asymptomatic for fluid accumulation. She reports she is feeling well.   She limits salt intake.    Optivol Thoracic impedance suggesting fluid levels returned to normal after recommendation to increase Torsemide to 100 mg bid and took extra Metolazone last week.    Prescribed: Torsemide 20 mg take 5 tablets (100 mg total) by mouth two times a day.  Potassium 20 mEq packet take 40 mEq total by mouth once a week on Tuesday.  Dissolve two (20 mEq ) packets in 4-6 oz of fluid three times a day when taking Metolazone 2.5 mg.   Spironolactone 25 mg take 1 tablet (25 mg total) by mouth at bedtime Metolazone 2.5 mg take 1 tablet by mouth once a week on Tuesdays with 40 mEq Potassium 3 times a day on Metolazone days   Labs: 03/13/2024 BMET scheduled at HF clinic appt 02/19/2024 Creatinine 1.14, BUN 38, Potassium 3.9, Sodium 140, GFR 54  02/18/2024 Creatinine 1.49, BUN 45, Potassium 3.9, Sodium 137, GFR 39  02/17/2024 Creatinine 1.46, BUN 48, Potassium 4.0, Sodium 137, GFR 40  02/16/2024 Creatinine 1.40, BUN 38, Potassium 3.9, Sodium 136, GFR 42 02/15/2024 Creatinine 1.36, BUN 31, Potassium 4.1, Sodium 135, GFR 44  02/14/2024 Creatinine N/A,  BUN N/A, Potassium 5.6, Sodium 137 (10:10 PM) 02/14/2024 Creatinine 1.20, BUN 27, Potassium 3.6, Sodium 142, GFR 51 (9:32 PM)  01/25/2024 Creatinine 1.53, BUN 48 ,Potassium 4.5, Sodium 137, GFR 38 01/14/2024 Creatinine 1.20, BUN 18, Potassium 3.2, Sodium 136, GFR 51 01/09/2024 Creatinine 1.53, BUN 31, Potassium 2.8,  Sodium 133, GFR 38 A complete set of results can be found in Results Review.   Recommendations: No changes and encouraged to call if experiencing any fluid symptoms.   Follow-up plan: ICM clinic phone appointment on 03/25/2024 to recheck fluid levels for stability.   91 day device clinic remote transmission 03/31/2024.     EP/Cardiology Office Visits:  03/13/2024 with HF Clinic.  Recall 07/26/2024 with Otilio Saber, PA   Copy of ICM check sent to Dr. Lalla Brothers.  3 month ICM trend: 03/10/2024.    12-14 Month ICM trend:     Karie Soda, RN 03/10/2024 3:10 PM

## 2024-03-12 ENCOUNTER — Telehealth (HOSPITAL_COMMUNITY): Payer: Self-pay

## 2024-03-12 NOTE — Progress Notes (Signed)
 Advanced Heart Failure Clinic Note  PCP:  Georganna Skeans, MD  Oncology: Dr Shirline Frees  HF Cardiologist: Dr [name]  Reason for Visit: Heart Failure Follow-up HPI: Brittany [name] is a 62 y.o. female with a history of PAF, CAD s/p emergent CABG in 2019 and DES in 2020, R renal infarct in 2022,  previous smoker quit in 2015, hypertension, previous small cell lung cancer treated with chemo, chest XRT and prophylactic brain radiation in 2015, and chronic systolic HF EF ~25%  Admitted 07/1190 with NSTEMI and shock. Underwent emergent cath 02/03/18 showed LAD 100% stenosed, LCx 95% stenosed. Taken for emergent CABG 02/03/18. Required impella post op. Hospital course complicated by cardiogenic shock, HCAP, A fib, respiratory failure, and swallowing issues. She was discharged to SNF. Discharge weight 103 pounds.    In 2019 had multiple hospitalizations for HF and pleural effusion. Underwent pleurodesis at [facility].   Admitted 3/20 with NSTEMI and HF. Received DES to ostial ramus into distal left main based on cath below and underwent diuresis. Meds adjusted as tolerated. Echo with EF 25-30%.   Echo has remained down 20-25%.   Admitted twice in 2023 with low output requiring milrinone. Barostim placed.   Multiple CHF and AECOPD admission in May, August, October of 2024 and Janurary 2025. Echo 1/25 EF down to < 20%, G2DD, RV moderately to severely reduced. She remained DNR.  Most recent admission 3/6-3/10/2024 for CHF and AECOPD.  She returns today for heart failure follow up. Overall feeling well. NYHA III. Since being out of the hospital has been feeling well. Reports mild dyspnea, insomnia. Denies chest pain, lower extremity edema, orthopnea, palpitations, and dizziness. Frustrated that she was not seen by our team during her recent hospitalization. Able to perform ADLs. Appetite improving now that she is on Megace. Compliant with all medications.  Past Medical History:  Diagnosis Date    Acute respiratory failure (HCC) 05/05/2018   Acute systolic congestive heart failure (HCC) 02/03/2018   AICD (automatic cardioverter/defibrillator) present    Allergy    Anxiety    Asthma    DM2 (diabetes mellitus, type 2) (HCC) 10/20/2020   Hypertension    PAF (paroxysmal atrial fibrillation) (HCC)    Presence of permanent cardiac pacemaker    Prophylactic measure 08/03/14-08/19/14   Prophyl. 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   LIMA to LAD, SVG to D1, SVG to OM1, EVH via right thigh with implantation of Impella LD LVAD via direct aortic approach   Small cell lung cancer (HCC) 03/16/2014   Past Surgical History:  Procedure Laterality Date   BRONCHIAL BRUSHINGS  10/25/2020   Procedure: BRONCHIAL BRUSHINGS;  Surgeon: Leslye Peer, MD;  Location: [facility] ENDOSCOPY;  Service: Pulmonary;;   BRONCHIAL NEEDLE ASPIRATION BIOPSY  10/25/2020   Procedure: BRONCHIAL NEEDLE ASPIRATION BIOPSIES;  Surgeon: Leslye Peer, MD;  Location: [facility] ENDOSCOPY;  Service: Pulmonary;;   CARDIAC DEFIBRILLATOR PLACEMENT  08/15/2018   MDT Visia AF MRI VR ICD implanted by Dr Georgena Spurling for primary prevention of sudden   CESAREAN SECTION     CORONARY ARTERY BYPASS GRAFT N/A 02/03/2018   Procedure: CORONARY ARTERY BYPASS GRAFTING (CABG);  Surgeon: Purcell Nails, MD;  Location: [facility] OR;  Service: Open Heart Surgery;  Laterality: N/A;  Time 3 using left internal mammary artery and endoscopically harvested right saphenous vein   CORONARY BALLOON ANGIOPLASTY N/A 02/03/2018   Procedure: CORONARY BALLOON ANGIOPLASTY;  Surgeon: Swazil[place], Peter M, MD;  Location: [facility]  INVASIVE CV LAB;  Service: Cardiovascular;  Laterality: N/A;   CORONARY STENT INTERVENTION N/A 02/12/2019   Procedure: CORONARY STENT INTERVENTION;  Surgeon: Iran Ouch, MD;  Location: [facility] INVASIVE CV LAB;  Service: Cardiovascular;  Laterality: N/A;   CORONARY/GRAFT ACUTE MI REVASCULARIZATION N/A 02/03/2018   Procedure: Coronary/Graft Acute MI  Revascularization;  Surgeon: [place], Peter M, MD;  Location: [facility] INVASIVE CV LAB;  Service: Cardiovascular;  Laterality: N/A;   ENDOBRONCHIAL ULTRASOUND N/A 10/25/2020   Procedure: ENDOBRONCHIAL ULTRASOUND;  Surgeon: Leslye Peer, MD;  Location: [facility] ENDOSCOPY;  Service: Pulmonary;  Laterality: N/A;   FLEXIBLE BRONCHOSCOPY  10/25/2020   Procedure: FLEXIBLE BRONCHOSCOPY;  Surgeon: Leslye Peer, MD;  Location: [facility] ENDOSCOPY;  Service: Pulmonary;;   IABP INSERTION N/A 02/03/2018   Procedure: IABP Insertion;  Surgeon: [place], Peter M, MD;  Location: [facility] INVASIVE CV LAB;  Service: Cardiovascular;  Laterality: N/A;   INTRAOPERATIVE TRANSESOPHAGEAL ECHOCARDIOGRAM N/A 02/03/2018   Procedure: INTRAOPERATIVE TRANSESOPHAGEAL ECHOCARDIOGRAM;  Surgeon: Purcell Nails, MD;  Location: [facility] OR;  Service: Open Heart Surgery;  Laterality: N/A;   LEFT HEART CATH AND CORONARY ANGIOGRAPHY N/A 02/03/2018   Procedure: LEFT HEART CATH AND CORONARY ANGIOGRAPHY;  Surgeon: [place], Peter M, MD;  Location: [facility] INVASIVE CV LAB;  Service: Cardiovascular;  Laterality: N/A;   LEFT HEART CATH AND CORS/GRAFTS ANGIOGRAPHY N/A 02/12/2019   Procedure: LEFT HEART CATH AND CORS/GRAFTS ANGIOGRAPHY;  Surgeon: Iran Ouch, MD;  Location: [facility] INVASIVE CV LAB;  Service: Cardiovascular;  Laterality: N/A;   MEDIASTINOSCOPY N/A 03/11/2014   Procedure: MEDIASTINOSCOPY;  Surgeon: Loreli Slot, MD;  Location: [facility] OR;  Service: Thoracic;  Laterality: N/A;   PLACEMENT OF IMPELLA LEFT VENTRICULAR ASSIST DEVICE  02/03/2018   Procedure: PLACEMENT OF IMPELLA LEFT VENTRICULAR ASSIST DEVICE LD;  Surgeon: Purcell Nails, MD;  Location: [facility] OR;  Service: Open Heart Surgery;;   REMOVAL OF IMPELLA LEFT VENTRICULAR ASSIST DEVICE N/A 02/08/2018   Procedure: REMOVAL OF IMPELLA LEFT VENTRICULAR ASSIST DEVICE;  Surgeon: Purcell Nails, MD;  Location: [facility] OR;  Service: Open Heart Surgery;  Laterality: N/A;   RIGHT HEART CATH N/A 02/03/2018   Procedure: RIGHT  HEART CATH;  Surgeon: [place]il[place], Peter M, MD;  Location: [facility] INVASIVE CV LAB;  Service: Cardiovascular;  Laterality: N/A;   RIGHT HEART CATH N/A 05/09/2018   Procedure: RIGHT HEART CATH;  Surgeon: Dolores Patty, MD;  Location: [facility] INVASIVE CV LAB;  Service: Cardiovascular;  Laterality: N/A;   TEE WITHOUT CARDIOVERSION N/A 02/08/2018   Procedure: TRANSESOPHAGEAL ECHOCARDIOGRAM (TEE);  Surgeon: Purcell Nails, MD;  Location: [facility] OR;  Service: Open Heart Surgery;  Laterality: N/A;   TUBAL LIGATION     VIDEO BRONCHOSCOPY WITH ENDOBRONCHIAL ULTRASOUND N/A 03/11/2014   Procedure: VIDEO BRONCHOSCOPY WITH ENDOBRONCHIAL ULTRASOUND;  Surgeon: Loreli Slot, MD;  Location: [facility] OR;  Service: Thoracic;  Laterality: N/A;   Current Outpatient Medications  Medication Sig Dispense Refill   albuterol (VENTOLIN HFA) 108 (90 Base) MCG/ACT inhaler Inhale 2 puffs into the lungs every 6 (six) hours as needed for wheezing or shortness of breath. 8.5 g 1   atorvastatin (LIPITOR) 80 MG tablet Take 1 tablet (80 mg total) by mouth daily. 90 tablet 0   cyclobenzaprine (FLEXERIL) 10 MG tablet Take 1 tablet (10 mg total) by mouth 3 (three) times daily as needed for muscle spasms. (Patient taking differently: Take 0.5 mg by mouth daily as needed for muscle spasms.) 60 tablet 3   digoxin (LANOXIN) 0.125 MG  tablet TAKE 1 TABLET BY MOUTH DAILY (Patient taking differently: Take 125 mcg by mouth at bedtime.) 90 tablet 3   ELIQUIS 2.5 MG TABS tablet TAKE 1 TABLET BY MOUTH TWICE A DAY 180 tablet 1   FARXIGA 10 MG TABS tablet TAKE 1 TABLET BY MOUTH EVERY DAY 90 tablet 3   Fluticasone-Umeclidin-Vilant (TRELEGY ELLIPTA) 200-62.5-25 MCG/ACT AEPB Inhale 1 puff into the lungs daily. 60 each 0   hydrOXYzine (VISTARIL) 25 MG capsule TAKE 1 CAPSULE (25 MG TOTAL) BY MOUTH DAILY AS NEEDED. 90 capsule 1   ipratropium-albuterol (DUONEB) 0.5-2.5 (3) MG/3ML SOLN TAKE 3 MLS BY NEBULIZATION EVERY 4 (FOUR) HOURS AS NEEDED (FOR SHORTNESS OF BREATH).  90 mL 1   ivabradine (CORLANOR) 7.5 MG TABS tablet Take 1 tablet (7.5 mg total) by mouth 2 (two) times daily with a meal. 60 tablet 6   megestrol (MEGACE) 20 MG tablet Take 1 tablet (20 mg total) by mouth 2 (two) times daily. 60 tablet 1   metolazone (ZAROXOLYN) 2.5 MG tablet Take 1 tablet (2.5 mg total) by mouth once a week. on Tuesday starting 01/15/2024 with 40 mEq of Potassium 3 times per day on metolazone days 10 tablet 5   midodrine (PROAMATINE) 5 MG tablet Take 1 tablet (5 mg total) by mouth 3 (three) times daily with meals. 90 tablet 1   Nutritional Supplements (ENSURE ORIGINAL) LIQD Take 1 Bottle by mouth 2 (two) times daily after a meal. Drink 1 bottle 2 times daily.     potassium chloride (KLOR-CON) 20 MEQ packet Take 40 mEq by mouth daily.   Dissolve two ( ) packets in 4-6oz of fluid three times a day when taking Metolazone 2.5mg .     spironolactone (ALDACTONE) 25 MG tablet Take 1 tablet (25 mg total) by mouth at bedtime.     torsemide (DEMADEX) 20 MG tablet Take 5 tablets (100 mg total) by mouth 2 (two) times daily.     nitroGLYCERIN (NITROSTAT) 0.4 MG SL tablet Place 1 tablet (0.4 mg total) under the tongue every 5 (five) minutes as needed for chest pain. 90 tablet 0   No current facility-administered medications for this encounter.    Allergies:   Lactose intolerance (gi) and Codeine   Social History:  The patient  reports that she quit smoking about 10 years ago. Her smoking use included cigarettes. She started smoking about 30 years ago. She has a 20 pack-year smoking history. She has never used smokeless tobacco. She reports current alcohol use of about 5.0 standard drinks of alcohol per week. She reports current drug use. Drug: Marijuana.   Family History:  The patient's family history includes Cancer in her maternal grandmother; Diabetes in her paternal grandmother; Heart attack in her mother; Heart disease in her mother; Hypertension in her maternal grandmother and mother.    ROS:  Please see the history of present illness.   All other systems are personally reviewed and negative.    Wt Readings from Last 3 Encounters:  03/13/24 44.8 kg (98 lb 12.8 oz)  02/26/24 44.3 kg (97 lb 9.6 oz)  02/19/24 44.5 kg (98 lb 3.2 oz)   BP 98/70   Pulse (!) 101   Wt 44.8 kg (98 lb 12.8 oz)   SpO2 100%   BMI 19.62 kg/m   Physical Exam General: Well appearing. No distress on RA. Walked into clinic Cardiac: JVP ~6cm. S1 and S2 present. No murmurs or rub. Abdomen: Soft, non-tender, non-distended.  Extremities: Warm and dry.  No edema.  Neuro: Alert and oriented x3. Affect pleasant. Moves all extremities without difficulty.  Device interrogation (personally reviewed): Optivol down, 0 AF, 0 VT/VF, VP <0.1%, activity 0.8 h/day  Assessment & Plan: 1. Chronic Systolic Heart Failure - EF has been 20-25% for the last 5 years.  - S/p Medtronic ICD and Barostim - Echo 1/25: EF < 20%, mod to severely reduced RV (in setting of RSV) - NYHA III-IIIb, functional class confounded by deconditioning and COPD. Volume ok today on exam and OptiVol. Will have Randon Goldsmith send repeat interrogation in 7-10d. - GDMT limited by need for BP support with midodrine - Continue midodrine 5 mg tid. BP too low to wean. - Continue metolazone 2.5 mg + 40 KCL tid once weekly (Tuesdays) - Continue torsemide 100 mg bid  - Continue Farxiga 10 mg daily. - Continue digoxin 0.125 mg daily.  - Continue ivabradine 7.5 mg bid. - Continue spironolactone 25 mg daily.  - Not a candidate for advanced therapies due to severity of lung disease.  - She is enrolled in monthly iCM  2. Chronic Hypoxic Respiratory Failure/ AECOPD  - Hx frequent admissions for AE COPD + A/C HFrEF.  - No longer on oxygen. - Continue inhalers.   3. CKD Stage IIIb  - Creatinine baseline ~ 1.4-1.6 - Continue SGLT2i - BMET today   4. PAF  - Regular on exam today. - Continue Eliquis 2.5 mg bid. Reduced dose (weight/creatinine).    5. CAD - History of CABG x 2 2019 - s/p DES ostial ramus extending to left main (2020) - No chest pain.  - Continue atorvastatin 80 mg daily - No ASA with need for AC   6. H/o SCLC - Completed treatment 2015.  - CT chest (5/24) soft tissue thickening along mediastinum but no discrete mass    7. H/o Renal Infarct 2022 - On Eliquis. No bleeding issues.  8. GOC/FTT - She has had a significant functional decline. - Consider GOC conversations - She is DNR/DNI  Follow up with Dr. [name] in 1-2 months  [place] Keonia Pasko, NP 03/13/24

## 2024-03-12 NOTE — Telephone Encounter (Signed)
 Called to confirm/remind patient of their appointment at the [facility] on 03/13/24***.   Appointment:   [x] Confirmed  [] Left mess   [] No answer/No voice mail  [] Phone not in service  Patient reminded to bring all medications and/or complete list.  Confirmed patient has transportation. Gave directions, instructed to utilize valet parking.

## 2024-03-13 ENCOUNTER — Encounter (HOSPITAL_COMMUNITY): Payer: Self-pay

## 2024-03-13 ENCOUNTER — Ambulatory Visit (HOSPITAL_COMMUNITY)
Admit: 2024-03-13 | Discharge: 2024-03-13 | Disposition: A | Discharge status: Home / Self Care | Source: Ambulatory Visit | Attending: Cardiology | Admitting: Cardiology

## 2024-03-13 VITALS — BP 98/70 | HR 101 | Wt 98.8 lb

## 2024-03-13 DIAGNOSIS — J449 Chronic obstructive pulmonary disease, unspecified: Secondary | ICD-10-CM | POA: Diagnosis not present

## 2024-03-13 DIAGNOSIS — Z9221 Personal history of antineoplastic chemotherapy: Secondary | ICD-10-CM | POA: Insufficient documentation

## 2024-03-13 DIAGNOSIS — Z951 Presence of aortocoronary bypass graft: Secondary | ICD-10-CM | POA: Diagnosis not present

## 2024-03-13 DIAGNOSIS — Z95810 Presence of automatic (implantable) cardiac defibrillator: Secondary | ICD-10-CM | POA: Diagnosis not present

## 2024-03-13 DIAGNOSIS — Z7901 Long term (current) use of anticoagulants: Secondary | ICD-10-CM | POA: Diagnosis not present

## 2024-03-13 DIAGNOSIS — N1832 Chronic kidney disease, stage 3b: Secondary | ICD-10-CM

## 2024-03-13 DIAGNOSIS — J9611 Chronic respiratory failure with hypoxia: Secondary | ICD-10-CM | POA: Insufficient documentation

## 2024-03-13 DIAGNOSIS — Z87891 Personal history of nicotine dependence: Secondary | ICD-10-CM | POA: Diagnosis not present

## 2024-03-13 DIAGNOSIS — Z923 Personal history of irradiation: Secondary | ICD-10-CM | POA: Insufficient documentation

## 2024-03-13 DIAGNOSIS — E1122 Type 2 diabetes mellitus with diabetic chronic kidney disease: Secondary | ICD-10-CM | POA: Insufficient documentation

## 2024-03-13 DIAGNOSIS — I5022 Chronic systolic (congestive) heart failure: Secondary | ICD-10-CM | POA: Diagnosis not present

## 2024-03-13 DIAGNOSIS — I252 Old myocardial infarction: Secondary | ICD-10-CM | POA: Diagnosis not present

## 2024-03-13 DIAGNOSIS — Z955 Presence of coronary angioplasty implant and graft: Secondary | ICD-10-CM | POA: Insufficient documentation

## 2024-03-13 DIAGNOSIS — I2510 Atherosclerotic heart disease of native coronary artery without angina pectoris: Secondary | ICD-10-CM

## 2024-03-13 DIAGNOSIS — Z85118 Personal history of other malignant neoplasm of bronchus and lung: Secondary | ICD-10-CM | POA: Insufficient documentation

## 2024-03-13 DIAGNOSIS — I480 Paroxysmal atrial fibrillation: Secondary | ICD-10-CM | POA: Diagnosis not present

## 2024-03-13 DIAGNOSIS — Z87448 Personal history of other diseases of urinary system: Secondary | ICD-10-CM | POA: Insufficient documentation

## 2024-03-13 DIAGNOSIS — I509 Heart failure, unspecified: Secondary | ICD-10-CM | POA: Diagnosis present

## 2024-03-13 DIAGNOSIS — I130 Hypertensive heart and chronic kidney disease with heart failure and stage 1 through stage 4 chronic kidney disease, or unspecified chronic kidney disease: Secondary | ICD-10-CM | POA: Diagnosis not present

## 2024-03-13 LAB — BASIC METABOLIC PANEL WITH GFR
Anion gap: 11 (ref 5–15)
BUN: 23 mg/dL (ref 8–23)
CO2: 23 mmol/L (ref 22–32)
Calcium: 9.2 mg/dL (ref 8.9–10.3)
Chloride: 106 mmol/L (ref 98–111)
Creatinine, Ser: 1.1 mg/dL — ABNORMAL HIGH (ref 0.44–1.00)
GFR, Estimated: 56 mL/min — ABNORMAL LOW (ref >60)
Glucose, Bld: 144 mg/dL — ABNORMAL HIGH (ref 70–99)
Potassium: 4 mmol/L (ref 3.5–5.1)
Sodium: 140 mmol/L (ref 135–145)

## 2024-03-13 LAB — BRAIN NATRIURETIC PEPTIDE: B Natriuretic Peptide: 1892.9 pg/mL — ABNORMAL HIGH (ref 0.0–100.0)

## 2024-03-13 MED ORDER — NITROGLYCERIN 0.4 MG SL SUBL: 0.4000 mg | SUBLINGUAL_TABLET | SUBLINGUAL | 0 refills | Status: AC | PRN

## 2024-03-13 NOTE — Patient Instructions (Addendum)
 Good to see you today!  Labs done today, your results will be available in MyChart, we will contact you for abnormal readings.  Your physician recommends that you schedule a follow-up appointment in: as scheduled with Dr. [name]  If you have any questions or concerns before your next appointment please send [place] a message through [facility] or call our office at [phone].    TO LEAVE A MESSAGE FOR THE NURSE SELECT OPTION 2, PLEASE LEAVE A MESSAGE INCLUDING: YOUR NAME DATE OF BIRTH CALL BACK NUMBER REASON FOR CALL**this is important as we prioritize the call backs  YOU WILL RECEIVE A CALL BACK THE SAME DAY AS LONG AS YOU CALL BEFORE 4:00 PM At the [facility], you and your health needs are our priority. As part of our continuing mission to provide you with exceptional heart care, we have created designated Provider Care Teams. These Care Teams include your primary Cardiologist (physician) and Advanced Practice Providers (APPs- Physician Assistants and Nurse Practitioners) who all work together to provide you with the care you need, when you need it.   You may see any of the following providers on your designated Care Team at your next follow up: Dr Arvilla Meres Dr Marca Ancona Dr. Dorthula Nettles Dr. Clearnce Hasten Amy Filbert Schilder, NP Robbie Lis, [place] [facility] [place], [place] Brynda Peon, NP [place] Lee, NP Clarisa Kindred, NP Karle Plumber, PharmD Enos Fling, PharmD   Please be sure to bring in all your medications bottles to every appointment.    Thank you for choosing [facility]

## 2024-03-19 ENCOUNTER — Telehealth: Payer: Self-pay

## 2024-03-19 ENCOUNTER — Encounter

## 2024-03-19 NOTE — Telephone Encounter (Signed)
 Attempted ICM call to request manual remote transmission report to recheck fluid levels for HF clinic. No answer and voice mail is not set up.

## 2024-03-20 NOTE — Telephone Encounter (Signed)
 Attempted ICM call to request manual remote transmission report to recheck fluid levels for HF clinic. No answer and voice mail is not set up.

## 2024-03-25 ENCOUNTER — Encounter

## 2024-03-31 ENCOUNTER — Ambulatory Visit (INDEPENDENT_AMBULATORY_CARE_PROVIDER_SITE_OTHER): Payer: BC Managed Care – PPO

## 2024-03-31 DIAGNOSIS — I5022 Chronic systolic (congestive) heart failure: Secondary | ICD-10-CM

## 2024-03-31 DIAGNOSIS — I480 Paroxysmal atrial fibrillation: Secondary | ICD-10-CM

## 2024-04-01 ENCOUNTER — Emergency Department (HOSPITAL_COMMUNITY)

## 2024-04-01 ENCOUNTER — Encounter (HOSPITAL_COMMUNITY): Payer: Self-pay

## 2024-04-01 ENCOUNTER — Other Ambulatory Visit: Payer: Self-pay

## 2024-04-01 ENCOUNTER — Inpatient Hospital Stay (HOSPITAL_COMMUNITY)
Admission: EM | Admit: 2024-04-01 | Discharge: 2024-04-07 | DRG: 280 | Disposition: A | Discharge status: Home / Self Care | Attending: Internal Medicine | Admitting: Internal Medicine

## 2024-04-01 ENCOUNTER — Encounter: Payer: Self-pay | Admitting: Cardiology

## 2024-04-01 DIAGNOSIS — R64 Cachexia: Secondary | ICD-10-CM | POA: Diagnosis present

## 2024-04-01 DIAGNOSIS — N1831 Chronic kidney disease, stage 3a: Secondary | ICD-10-CM | POA: Diagnosis present

## 2024-04-01 DIAGNOSIS — Z515 Encounter for palliative care: Secondary | ICD-10-CM

## 2024-04-01 DIAGNOSIS — E1169 Type 2 diabetes mellitus with other specified complication: Secondary | ICD-10-CM | POA: Diagnosis present

## 2024-04-01 DIAGNOSIS — Z79899 Other long term (current) drug therapy: Secondary | ICD-10-CM

## 2024-04-01 DIAGNOSIS — J9600 Acute respiratory failure, unspecified whether with hypoxia or hypercapnia: Secondary | ICD-10-CM | POA: Diagnosis not present

## 2024-04-01 DIAGNOSIS — J441 Chronic obstructive pulmonary disease with (acute) exacerbation: Secondary | ICD-10-CM | POA: Diagnosis present

## 2024-04-01 DIAGNOSIS — Z5982 Transportation insecurity: Secondary | ICD-10-CM

## 2024-04-01 DIAGNOSIS — Y92239 Unspecified place in hospital as the place of occurrence of the external cause: Secondary | ICD-10-CM | POA: Diagnosis not present

## 2024-04-01 DIAGNOSIS — I5022 Chronic systolic (congestive) heart failure: Secondary | ICD-10-CM | POA: Diagnosis not present

## 2024-04-01 DIAGNOSIS — E785 Hyperlipidemia, unspecified: Secondary | ICD-10-CM | POA: Diagnosis present

## 2024-04-01 DIAGNOSIS — E1122 Type 2 diabetes mellitus with diabetic chronic kidney disease: Secondary | ICD-10-CM | POA: Diagnosis present

## 2024-04-01 DIAGNOSIS — N1832 Chronic kidney disease, stage 3b: Secondary | ICD-10-CM | POA: Diagnosis not present

## 2024-04-01 DIAGNOSIS — I130 Hypertensive heart and chronic kidney disease with heart failure and stage 1 through stage 4 chronic kidney disease, or unspecified chronic kidney disease: Secondary | ICD-10-CM | POA: Diagnosis present

## 2024-04-01 DIAGNOSIS — Z66 Do not resuscitate: Secondary | ICD-10-CM | POA: Diagnosis present

## 2024-04-01 DIAGNOSIS — I5043 Acute on chronic combined systolic (congestive) and diastolic (congestive) heart failure: Secondary | ICD-10-CM | POA: Diagnosis present

## 2024-04-01 DIAGNOSIS — I5084 End stage heart failure: Secondary | ICD-10-CM | POA: Diagnosis present

## 2024-04-01 DIAGNOSIS — R570 Cardiogenic shock: Secondary | ICD-10-CM | POA: Diagnosis present

## 2024-04-01 DIAGNOSIS — E43 Unspecified severe protein-calorie malnutrition: Secondary | ICD-10-CM | POA: Diagnosis present

## 2024-04-01 DIAGNOSIS — E876 Hypokalemia: Secondary | ICD-10-CM | POA: Diagnosis not present

## 2024-04-01 DIAGNOSIS — J810 Acute pulmonary edema: Secondary | ICD-10-CM | POA: Diagnosis not present

## 2024-04-01 DIAGNOSIS — I480 Paroxysmal atrial fibrillation: Secondary | ICD-10-CM | POA: Diagnosis present

## 2024-04-01 DIAGNOSIS — T380X5A Adverse effect of glucocorticoids and synthetic analogues, initial encounter: Secondary | ICD-10-CM | POA: Diagnosis present

## 2024-04-01 DIAGNOSIS — Z6820 Body mass index (BMI) 20.0-20.9, adult: Secondary | ICD-10-CM

## 2024-04-01 DIAGNOSIS — Z833 Family history of diabetes mellitus: Secondary | ICD-10-CM

## 2024-04-01 DIAGNOSIS — I493 Ventricular premature depolarization: Secondary | ICD-10-CM | POA: Diagnosis present

## 2024-04-01 DIAGNOSIS — N179 Acute kidney failure, unspecified: Secondary | ICD-10-CM | POA: Diagnosis present

## 2024-04-01 DIAGNOSIS — I21A1 Myocardial infarction type 2: Secondary | ICD-10-CM | POA: Diagnosis present

## 2024-04-01 DIAGNOSIS — N183 Chronic kidney disease, stage 3 unspecified: Secondary | ICD-10-CM | POA: Diagnosis not present

## 2024-04-01 DIAGNOSIS — Z8249 Family history of ischemic heart disease and other diseases of the circulatory system: Secondary | ICD-10-CM

## 2024-04-01 DIAGNOSIS — Z885 Allergy status to narcotic agent status: Secondary | ICD-10-CM

## 2024-04-01 DIAGNOSIS — Z923 Personal history of irradiation: Secondary | ICD-10-CM

## 2024-04-01 DIAGNOSIS — Z955 Presence of coronary angioplasty implant and graft: Secondary | ICD-10-CM

## 2024-04-01 DIAGNOSIS — E119 Type 2 diabetes mellitus without complications: Secondary | ICD-10-CM | POA: Diagnosis present

## 2024-04-01 DIAGNOSIS — I252 Old myocardial infarction: Secondary | ICD-10-CM

## 2024-04-01 DIAGNOSIS — I2510 Atherosclerotic heart disease of native coronary artery without angina pectoris: Secondary | ICD-10-CM | POA: Diagnosis present

## 2024-04-01 DIAGNOSIS — I5023 Acute on chronic systolic (congestive) heart failure: Secondary | ICD-10-CM | POA: Diagnosis not present

## 2024-04-01 DIAGNOSIS — I10 Essential (primary) hypertension: Secondary | ICD-10-CM | POA: Diagnosis present

## 2024-04-01 DIAGNOSIS — T501X5A Adverse effect of loop [high-ceiling] diuretics, initial encounter: Secondary | ICD-10-CM | POA: Diagnosis not present

## 2024-04-01 DIAGNOSIS — Z951 Presence of aortocoronary bypass graft: Secondary | ICD-10-CM

## 2024-04-01 DIAGNOSIS — R7989 Other specified abnormal findings of blood chemistry: Secondary | ICD-10-CM | POA: Diagnosis present

## 2024-04-01 DIAGNOSIS — Z7901 Long term (current) use of anticoagulants: Secondary | ICD-10-CM

## 2024-04-01 DIAGNOSIS — Z95810 Presence of automatic (implantable) cardiac defibrillator: Secondary | ICD-10-CM

## 2024-04-01 DIAGNOSIS — R54 Age-related physical debility: Secondary | ICD-10-CM | POA: Diagnosis present

## 2024-04-01 DIAGNOSIS — J9601 Acute respiratory failure with hypoxia: Secondary | ICD-10-CM | POA: Diagnosis not present

## 2024-04-01 DIAGNOSIS — E1165 Type 2 diabetes mellitus with hyperglycemia: Secondary | ICD-10-CM | POA: Diagnosis present

## 2024-04-01 DIAGNOSIS — Z85118 Personal history of other malignant neoplasm of bronchus and lung: Secondary | ICD-10-CM

## 2024-04-01 DIAGNOSIS — Z9221 Personal history of antineoplastic chemotherapy: Secondary | ICD-10-CM

## 2024-04-01 DIAGNOSIS — J9621 Acute and chronic respiratory failure with hypoxia: Secondary | ICD-10-CM | POA: Diagnosis present

## 2024-04-01 DIAGNOSIS — R627 Adult failure to thrive: Secondary | ICD-10-CM | POA: Diagnosis present

## 2024-04-01 DIAGNOSIS — Z87891 Personal history of nicotine dependence: Secondary | ICD-10-CM

## 2024-04-01 DIAGNOSIS — Z7189 Other specified counseling: Secondary | ICD-10-CM | POA: Diagnosis not present

## 2024-04-01 DIAGNOSIS — M549 Dorsalgia, unspecified: Secondary | ICD-10-CM | POA: Diagnosis not present

## 2024-04-01 DIAGNOSIS — I2489 Other forms of acute ischemic heart disease: Secondary | ICD-10-CM | POA: Diagnosis present

## 2024-04-01 DIAGNOSIS — I5082 Biventricular heart failure: Secondary | ICD-10-CM | POA: Diagnosis present

## 2024-04-01 DIAGNOSIS — F419 Anxiety disorder, unspecified: Secondary | ICD-10-CM | POA: Diagnosis present

## 2024-04-01 DIAGNOSIS — Z5986 Financial insecurity: Secondary | ICD-10-CM

## 2024-04-01 DIAGNOSIS — I429 Cardiomyopathy, unspecified: Secondary | ICD-10-CM | POA: Diagnosis present

## 2024-04-01 DIAGNOSIS — R000 Tachycardia, unspecified: Secondary | ICD-10-CM | POA: Diagnosis present

## 2024-04-01 DIAGNOSIS — E739 Lactose intolerance, unspecified: Secondary | ICD-10-CM | POA: Diagnosis present

## 2024-04-01 DIAGNOSIS — I5021 Acute systolic (congestive) heart failure: Secondary | ICD-10-CM | POA: Diagnosis not present

## 2024-04-01 DIAGNOSIS — Z9981 Dependence on supplemental oxygen: Secondary | ICD-10-CM

## 2024-04-01 DIAGNOSIS — E872 Acidosis, unspecified: Secondary | ICD-10-CM | POA: Diagnosis present

## 2024-04-01 DIAGNOSIS — Z8679 Personal history of other diseases of the circulatory system: Secondary | ICD-10-CM

## 2024-04-01 LAB — CBC WITH DIFFERENTIAL/PLATELET
Abs Immature Granulocytes: 0.09 10*3/uL — ABNORMAL HIGH (ref 0.00–0.07)
Basophils Absolute: 0.1 10*3/uL (ref 0.0–0.1)
Basophils Relative: 1 %
Eosinophils Absolute: 0.2 10*3/uL (ref 0.0–0.5)
Eosinophils Relative: 1 %
HCT: 42.7 % (ref 36.0–46.0)
Hemoglobin: 13.1 g/dL (ref 12.0–15.0)
Immature Granulocytes: 1 %
Lymphocytes Relative: 16 %
Lymphs Abs: 2.1 10*3/uL (ref 0.7–4.0)
MCH: 29.1 pg (ref 26.0–34.0)
MCHC: 30.7 g/dL (ref 30.0–36.0)
MCV: 94.9 fL (ref 80.0–100.0)
Monocytes Absolute: 1.1 10*3/uL — ABNORMAL HIGH (ref 0.1–1.0)
Monocytes Relative: 8 %
Neutro Abs: 9.9 10*3/uL — ABNORMAL HIGH (ref 1.7–7.7)
Neutrophils Relative %: 73 %
Platelets: 247 10*3/uL (ref 150–400)
RBC: 4.5 MIL/uL (ref 3.87–5.11)
RDW: 18.9 % — ABNORMAL HIGH (ref 11.5–15.5)
WBC: 13.5 10*3/uL — ABNORMAL HIGH (ref 4.0–10.5)
nRBC: 0.2 % (ref 0.0–0.2)

## 2024-04-01 LAB — CUP PACEART REMOTE DEVICE CHECK
Battery Remaining Longevity: 76 mo
Battery Voltage: 3.01 V
Brady Statistic RV Percent Paced: 0.01 %
Date Time Interrogation Session: 20250421012502
HighPow Impedance: 61 Ohm
Implantable Lead Connection Status: 753985
Implantable Lead Implant Date: 20190905
Implantable Lead Location: 753860
Implantable Pulse Generator Implant Date: 20190905
Lead Channel Impedance Value: 323 Ohm
Lead Channel Impedance Value: 437 Ohm
Lead Channel Pacing Threshold Amplitude: 0.875 V
Lead Channel Pacing Threshold Pulse Width: 0.4 ms
Lead Channel Sensing Intrinsic Amplitude: 22 mV
Lead Channel Sensing Intrinsic Amplitude: 22 mV
Lead Channel Setting Pacing Amplitude: 2 V
Lead Channel Setting Pacing Pulse Width: 0.4 ms
Lead Channel Setting Sensing Sensitivity: 0.3 mV
Zone Setting Status: 755011
Zone Setting Status: 755011

## 2024-04-01 LAB — BASIC METABOLIC PANEL WITH GFR
Anion gap: 15 (ref 5–15)
BUN: 26 mg/dL — ABNORMAL HIGH (ref 8–23)
CO2: 21 mmol/L — ABNORMAL LOW (ref 22–32)
Calcium: 9.5 mg/dL (ref 8.9–10.3)
Chloride: 102 mmol/L (ref 98–111)
Creatinine, Ser: 1.71 mg/dL — ABNORMAL HIGH (ref 0.44–1.00)
GFR, Estimated: 33 mL/min — ABNORMAL LOW (ref >60)
Glucose, Bld: 314 mg/dL — ABNORMAL HIGH (ref 70–99)
Potassium: 3.3 mmol/L — ABNORMAL LOW (ref 3.5–5.1)
Sodium: 138 mmol/L (ref 135–145)

## 2024-04-01 LAB — I-STAT VENOUS BLOOD GAS, ED
Acid-base deficit: 1 mmol/L (ref 0.0–2.0)
Bicarbonate: 25.2 mmol/L (ref 20.0–28.0)
Calcium, Ion: 1.08 mmol/L — ABNORMAL LOW (ref 1.15–1.40)
HCT: 41 % (ref 36.0–46.0)
Hemoglobin: 13.9 g/dL (ref 12.0–15.0)
O2 Saturation: 90 %
Potassium: 3.8 mmol/L (ref 3.5–5.1)
Sodium: 139 mmol/L (ref 135–145)
TCO2: 27 mmol/L (ref 22–32)
pCO2, Ven: 47.2 mmHg (ref 44–60)
pH, Ven: 7.336 (ref 7.25–7.43)
pO2, Ven: 64 mmHg — ABNORMAL HIGH (ref 32–45)

## 2024-04-01 LAB — I-STAT CHEM 8, ED
BUN: 35 mg/dL — ABNORMAL HIGH (ref 8–23)
Calcium, Ion: 1.07 mmol/L — ABNORMAL LOW (ref 1.15–1.40)
Chloride: 107 mmol/L (ref 98–111)
Creatinine, Ser: 1.8 mg/dL — ABNORMAL HIGH (ref 0.44–1.00)
Glucose, Bld: 332 mg/dL — ABNORMAL HIGH (ref 70–99)
HCT: 43 % (ref 36.0–46.0)
Hemoglobin: 14.6 g/dL (ref 12.0–15.0)
Potassium: 3.7 mmol/L (ref 3.5–5.1)
Sodium: 140 mmol/L (ref 135–145)
TCO2: 25 mmol/L (ref 22–32)

## 2024-04-01 LAB — TROPONIN I (HIGH SENSITIVITY)
Troponin I (High Sensitivity): 64 ng/L — ABNORMAL HIGH (ref <18)
Troponin I (High Sensitivity): 94 ng/L — ABNORMAL HIGH (ref <18)

## 2024-04-01 LAB — I-STAT CG4 LACTIC ACID, ED: Lactic Acid, Venous: 3.1 mmol/L (ref 0.5–1.9)

## 2024-04-01 LAB — LACTIC ACID, PLASMA: Lactic Acid, Venous: 3 mmol/L (ref 0.5–1.9)

## 2024-04-01 LAB — RESP PANEL BY RT-PCR (RSV, FLU A&B, COVID)  RVPGX2
Influenza A by PCR: NEGATIVE
Influenza B by PCR: NEGATIVE
Resp Syncytial Virus by PCR: NEGATIVE
SARS Coronavirus 2 by RT PCR: NEGATIVE

## 2024-04-01 LAB — BRAIN NATRIURETIC PEPTIDE: B Natriuretic Peptide: 4049.6 pg/mL — ABNORMAL HIGH (ref 0.0–100.0)

## 2024-04-01 LAB — DIGOXIN LEVEL: Digoxin Level: <0.2 ng/mL — ABNORMAL LOW (ref 0.8–2.0)

## 2024-04-01 MED ORDER — DAPAGLIFLOZIN PROPANEDIOL 10 MG PO TABS
10.0000 mg | ORAL_TABLET | Freq: Every day | ORAL | Status: DC
  Administered 2024-04-01 – 2024-04-07 (×7): 10 mg via ORAL
  Filled 2024-04-01 (×7): qty 1

## 2024-04-01 MED ORDER — SODIUM CHLORIDE 0.9% FLUSH
3.0000 mL | Freq: Two times a day (BID) | INTRAVENOUS | Status: DC
  Administered 2024-04-01 – 2024-04-07 (×10): 3 mL via INTRAVENOUS

## 2024-04-01 MED ORDER — MIDODRINE HCL 5 MG PO TABS
5.0000 mg | ORAL_TABLET | Freq: Three times a day (TID) | ORAL | Status: DC
  Administered 2024-04-01 – 2024-04-02 (×3): 5 mg via ORAL
  Filled 2024-04-01 (×4): qty 1

## 2024-04-01 MED ORDER — NITROGLYCERIN 2 % TD OINT
1.0000 [in_us] | TOPICAL_OINTMENT | Freq: Once | TRANSDERMAL | Status: AC
  Administered 2024-04-01: 1 [in_us] via TOPICAL
  Filled 2024-04-01: qty 1

## 2024-04-01 MED ORDER — ENOXAPARIN SODIUM 40 MG/0.4ML IJ SOSY: 40.0000 mg | PREFILLED_SYRINGE | INTRAMUSCULAR | Status: DC

## 2024-04-01 MED ORDER — ATORVASTATIN CALCIUM 80 MG PO TABS
80.0000 mg | ORAL_TABLET | Freq: Every day | ORAL | Status: DC
Start: 2024-04-01 — End: 2024-04-07
  Administered 2024-04-01 – 2024-04-07 (×7): 80 mg via ORAL
  Filled 2024-04-01 (×2): qty 1
  Filled 2024-04-01: qty 2
  Filled 2024-04-01 (×4): qty 1

## 2024-04-01 MED ORDER — POTASSIUM CHLORIDE 10 MEQ/100ML IV SOLN
10.0000 meq | INTRAVENOUS | Status: AC
  Administered 2024-04-01 (×2): 10 meq via INTRAVENOUS
  Filled 2024-04-01 (×2): qty 100

## 2024-04-01 MED ORDER — FUROSEMIDE 10 MG/ML IJ SOLN
60.0000 mg | Freq: Once | INTRAMUSCULAR | Status: AC
  Administered 2024-04-01: 60 mg via INTRAVENOUS
  Filled 2024-04-01: qty 6

## 2024-04-01 MED ORDER — APIXABAN 2.5 MG PO TABS
2.5000 mg | ORAL_TABLET | Freq: Two times a day (BID) | ORAL | Status: DC
  Administered 2024-04-01 – 2024-04-07 (×13): 2.5 mg via ORAL
  Filled 2024-04-01 (×13): qty 1

## 2024-04-01 MED ORDER — POTASSIUM CHLORIDE 10 MEQ/100ML IV SOLN: 10.0000 meq | Freq: Once | INTRAVENOUS | Status: DC

## 2024-04-01 MED ORDER — DOXYCYCLINE HYCLATE 100 MG PO TABS
100.0000 mg | ORAL_TABLET | Freq: Two times a day (BID) | ORAL | Status: DC
  Administered 2024-04-01 – 2024-04-05 (×9): 100 mg via ORAL
  Filled 2024-04-01 (×9): qty 1

## 2024-04-01 MED ORDER — SODIUM CHLORIDE 0.9 % IV SOLN: 250.0000 mL | INTRAVENOUS | Status: AC | PRN

## 2024-04-01 MED ORDER — POLYETHYLENE GLYCOL 3350 17 G PO PACK: 17.0000 g | PACK | Freq: Every day | ORAL | Status: DC | PRN

## 2024-04-01 MED ORDER — IPRATROPIUM-ALBUTEROL 0.5-2.5 (3) MG/3ML IN SOLN
3.0000 mL | RESPIRATORY_TRACT | Status: DC | PRN
  Administered 2024-04-01 – 2024-04-02 (×4): 3 mL via RESPIRATORY_TRACT
  Filled 2024-04-01 (×4): qty 3

## 2024-04-01 MED ORDER — FUROSEMIDE 10 MG/ML IJ SOLN
40.0000 mg | INTRAMUSCULAR | Status: AC
  Administered 2024-04-01: 40 mg via INTRAVENOUS
  Filled 2024-04-01: qty 4

## 2024-04-01 MED ORDER — SPIRONOLACTONE 25 MG PO TABS
25.0000 mg | ORAL_TABLET | Freq: Every day | ORAL | Status: DC
  Administered 2024-04-01 – 2024-04-05 (×5): 25 mg via ORAL
  Filled 2024-04-01 (×5): qty 1

## 2024-04-01 MED ORDER — ACETAMINOPHEN 650 MG RE SUPP: 650.0000 mg | Freq: Four times a day (QID) | RECTAL | Status: DC | PRN

## 2024-04-01 MED ORDER — DIGOXIN 125 MCG PO TABS
125.0000 ug | ORAL_TABLET | Freq: Every day | ORAL | Status: DC
  Administered 2024-04-01 – 2024-04-07 (×7): 125 ug via ORAL
  Filled 2024-04-01 (×7): qty 1

## 2024-04-01 MED ORDER — SODIUM CHLORIDE 0.9% FLUSH: 3.0000 mL | INTRAVENOUS | Status: DC | PRN

## 2024-04-01 MED ORDER — ACETAMINOPHEN 325 MG PO TABS
650.0000 mg | ORAL_TABLET | Freq: Four times a day (QID) | ORAL | Status: DC | PRN
  Administered 2024-04-03: 650 mg via ORAL
  Filled 2024-04-01: qty 2

## 2024-04-01 MED ORDER — POTASSIUM CHLORIDE 10 MEQ/100ML IV SOLN
10.0000 meq | INTRAVENOUS | Status: AC
  Administered 2024-04-01 (×4): 10 meq via INTRAVENOUS
  Filled 2024-04-01 (×4): qty 100

## 2024-04-01 MED ORDER — ALBUTEROL SULFATE (2.5 MG/3ML) 0.083% IN NEBU
2.5000 mg | INHALATION_SOLUTION | RESPIRATORY_TRACT | Status: DC
  Administered 2024-04-01: 2.5 mg via RESPIRATORY_TRACT
  Filled 2024-04-01: qty 3

## 2024-04-01 MED ORDER — FUROSEMIDE 10 MG/ML IJ SOLN
80.0000 mg | Freq: Two times a day (BID) | INTRAMUSCULAR | Status: DC
  Administered 2024-04-01: 80 mg via INTRAVENOUS
  Filled 2024-04-01: qty 8

## 2024-04-01 MED ORDER — METHYLPREDNISOLONE SODIUM SUCC 40 MG IJ SOLR
40.0000 mg | Freq: Two times a day (BID) | INTRAMUSCULAR | Status: DC
  Administered 2024-04-01 – 2024-04-02 (×3): 40 mg via INTRAVENOUS
  Filled 2024-04-01 (×3): qty 1

## 2024-04-01 NOTE — ED Provider Notes (Signed)
 Signout from [employer], PA-C at shift change. Briefly, patient presents for respiratory distress.  Has a history of CHF EF 25%  and COPD.  CBC notable for leukocytosis of 13.5, lactic of 3.1, chest x-ray without obvious infiltrate     6:32 AM Reassessment performed. Patient appears comfortable on BiPAP.  Diffuse Rales.  Abdomen nontender.  No active chest pain.  Lower extremities without significant edema or tenderness.  BNP notable to be over 4000, troponin of 64.  CBC with leukocytosis of 13.5.  BMP with potassium of 3.3, creatinine elevated to 1.71 appears to be within baseline.  POCUS with poor LV squeeze, plethoric IVC, diffuse B lines bilaterally.  She is on significant amount of Lasix  at home.  Will give Lasix  here in the ED and additionally began replenishing her potassium   Most current vital signs reviewed and are as follows: BP (!) 140/93   Pulse (!) 119   Resp (!) 36   Wt 46 kg   SpO2 100%   BMI 20.14 kg/m     Discussed patient with hospitalist Dr. Efrain Grant.  Agreed for admission Patient will be admitted for respiratory failure in the setting of CHF exacerbation, suspect troponin elevation due to demand.  No EKG changes. will continue to trend.    Felicie Horning, PA-C 04/12/24 1003    Eldon Greenland, MD 04/15/24 [phone]

## 2024-04-01 NOTE — H&P (Signed)
 [facility] History and Physical  Brittany [name]  JXB:[id number] DOB: July 29, 1961 DOA: 04/01/2024  Referring physician: ED  PCP: Abraham Abo, MD   Patient is coming from: Home  Chief Complaint: Respiratory distress  HPI:  Brittany [name]  is a 63 y.o. female with a past history of advanced heart failure, PAF, CAD s/p emergent CABG in 2019 and DES in 2020, R renal infarct in 2022,  hypertension, previous small cell lung cancer treated with chemo, XRT and prophylactic brain radiation in 2015, and chronic systolic HF EF ~25%, history of pleurodesis in [facility], with recurrent hospitalizations in the past presented to hospital this time with acute respiratory distress.  History of admission to the hospital twice in 2023 with low output requiring milrinone . Multiple CHF and AECOPD admission in May, August, October of 2024 and Janurary 2025.  Patient stated that she had shortness of breath for 2 to 3 hours at home and was unable to get her inhaler.  Patient was then brought into the hospital with CPAP.  EMS noted that the patient was pale and diaphoretic and received 2 breathing treatments as well as IV Solu-Medrol .  In the ED, CBC showed leukocytosis at 13.5.  ENT was more than 4000.  Troponin of 64.  Potassium was low at 3.3.  Creatinine was 1.7.  Chest x-ray showed vascular congestion.  Lactate was elevated at 3.1.  It EEG did not show any acute ischemic changes.  Patient received 60 mEq of IV Lasix  and received oral potassium as well.   Assessment and Plan Principal Problem:   Acute respiratory failure with hypoxia (HCC) Active Problems:   COPD with acute exacerbation (HCC)   History of CAD (coronary artery disease)   Acute on chronic systolic CHF (congestive heart failure) (HCC)   Troponin level elevated   CKD stage 3b, GFR 30-44 ml/min (HCC)   Essential hypertension   Type 2 diabetes mellitus with hyperlipidemia (HCC)   History of lung cancer  Acute  respiratory distress secondary to acute on chronic systolic heart failure exacerbation. Patient with recurrent admissions for the same.  Status post AICD and Barostim in the past.  Last 2D echocardiogram from 12/15/2023 with LV ejection fraction of less than 20% with global hypokinesis and grade 2 diastolic dysfunction.  Seen by cardiology as outpatient.  Was on midodrine  in the past including metolazone  torsemide  Farxiga  digoxin  ivabradine  and spironolactone .  Was considered to be not a candidate for advanced therapies due to severity of lung disease.  Will consult heart failure team.  Digoxin  level less than 3.2.  Chronic hypoxic respiratory failure secondary to COPD with mild exacerbation. Continue bronchodilators.  Required BiPAP in the ED.  Continue IV Solu-Medrol , will continue BiPAP.  Has BiPAP at home as necessary including oxygen as per patient.  Add oral doxycycline .  Elevated troponin likely secondary to demand ischemia.  No active chest pain.  History of elevated troponins in the past.  Hypokalemia.  Potassium of 3.3.  Received with 20 mg of potassium.  Will add 40 milliequivalents more IV potassium.  Check BMP in AM.   CKD Stage IIIb  Creatinine baseline ~ 1.4-1.6.  Creatinine on presentation at 1.7.  Continue to monitor closely while on IV diuretics.  Received 100 mg of the Lasix  today.  Will repeat dose in AM.  Paroxysmal atrial fibrillation. On eliqis at home.  Med rec pending.  Will restart Eliquis    CAD with history of CABG 2019. Continue Lipitor .   H/o SCLC - Completed treatment 2015.  Continue oxygen nebulizers BiPAP   Failure to thrive, recurrent hospitalization. Patient is DNR/DNI.  Will continue to have goals of care discussion.  DVT Prophylaxis: Eliquis .  Review of Systems:  All systems were reviewed and were negative unless otherwise mentioned in the HPI   Past Medical History:  Diagnosis Date   Acute respiratory failure (HCC) 05/05/2018   Acute systolic  congestive heart failure (HCC) 02/03/2018   AICD (automatic cardioverter/defibrillator) present    Allergy    Anxiety    Asthma    DM2 (diabetes mellitus, type 2) (HCC) 10/20/2020   Hypertension    PAF (paroxysmal atrial fibrillation) (HCC)    Presence of permanent cardiac pacemaker    Prophylactic measure 08/03/14-08/19/14   Prophyl. 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   LIMA to LAD, SVG to D1, SVG to OM1, EVH via right thigh with implantation of Impella LD LVAD via direct aortic approach   Small cell lung cancer (HCC) 03/16/2014   Past Surgical History:  Procedure Laterality Date   BRONCHIAL BRUSHINGS  10/25/2020   Procedure: BRONCHIAL BRUSHINGS;  Surgeon: Denson Flake, MD;  Location: [facility] ENDOSCOPY;  Service: Pulmonary;;   BRONCHIAL NEEDLE ASPIRATION BIOPSY  10/25/2020   Procedure: BRONCHIAL NEEDLE ASPIRATION BIOPSIES;  Surgeon: Denson Flake, MD;  Location: [facility] ENDOSCOPY;  Service: Pulmonary;;   CARDIAC DEFIBRILLATOR PLACEMENT  08/15/2018   MDT Visia AF MRI VR ICD implanted by Dr Dolly Fret for primary prevention of sudden   CESAREAN SECTION     CORONARY ARTERY BYPASS GRAFT N/A 02/03/2018   Procedure: CORONARY ARTERY BYPASS GRAFTING (CABG);  Surgeon: Gardenia Jump, MD;  Location: [facility] OR;  Service: Open Heart Surgery;  Laterality: N/A;  Time 3 using left internal mammary artery and endoscopically harvested right saphenous vein   CORONARY BALLOON ANGIOPLASTY N/A 02/03/2018   Procedure: CORONARY BALLOON ANGIOPLASTY;  Surgeon: Swaziland, Peter M, MD;  Location: [facility] INVASIVE CV LAB;  Service: Cardiovascular;  Laterality: N/A;   CORONARY STENT INTERVENTION N/A 02/12/2019   Procedure: CORONARY STENT INTERVENTION;  Surgeon: Wenona Hamilton, MD;  Location: [facility] INVASIVE CV LAB;  Service: Cardiovascular;  Laterality: N/A;   CORONARY/GRAFT ACUTE MI REVASCULARIZATION N/A 02/03/2018   Procedure: Coronary/Graft Acute MI Revascularization;  Surgeon: Swaziland, Peter M, MD;  Location: [facility]  INVASIVE CV LAB;  Service: Cardiovascular;  Laterality: N/A;   ENDOBRONCHIAL ULTRASOUND N/A 10/25/2020   Procedure: ENDOBRONCHIAL ULTRASOUND;  Surgeon: Denson Flake, MD;  Location: [facility] ENDOSCOPY;  Service: Pulmonary;  Laterality: N/A;   FLEXIBLE BRONCHOSCOPY  10/25/2020   Procedure: FLEXIBLE BRONCHOSCOPY;  Surgeon: Denson Flake, MD;  Location: [facility] ENDOSCOPY;  Service: Pulmonary;;   IABP INSERTION N/A 02/03/2018   Procedure: IABP Insertion;  Surgeon: Swaziland, Peter M, MD;  Location: [facility] INVASIVE CV LAB;  Service: Cardiovascular;  Laterality: N/A;   INTRAOPERATIVE TRANSESOPHAGEAL ECHOCARDIOGRAM N/A 02/03/2018   Procedure: INTRAOPERATIVE TRANSESOPHAGEAL ECHOCARDIOGRAM;  Surgeon: Gardenia Jump, MD;  Location: [facility] OR;  Service: Open Heart Surgery;  Laterality: N/A;   LEFT HEART CATH AND CORONARY ANGIOGRAPHY N/A 02/03/2018   Procedure: LEFT HEART CATH AND CORONARY ANGIOGRAPHY;  Surgeon: Swaziland, Peter M, MD;  Location: [facility] INVASIVE CV LAB;  Service: Cardiovascular;  Laterality: N/A;   LEFT HEART CATH AND CORS/GRAFTS ANGIOGRAPHY N/A 02/12/2019   Procedure: LEFT HEART CATH AND CORS/GRAFTS ANGIOGRAPHY;  Surgeon: Wenona Hamilton, MD;  Location: [facility] INVASIVE CV LAB;  Service: Cardiovascular;  Laterality: N/A;   MEDIASTINOSCOPY N/A 03/11/2014   Procedure: MEDIASTINOSCOPY;  Surgeon: Zelphia Higashi, MD;  Location: [facility] OR;  Service: Thoracic;  Laterality: N/A;   PLACEMENT OF IMPELLA LEFT VENTRICULAR ASSIST DEVICE  02/03/2018   Procedure: PLACEMENT OF IMPELLA LEFT VENTRICULAR ASSIST DEVICE LD;  Surgeon: Gardenia Jump, MD;  Location: [facility] OR;  Service: Open Heart Surgery;;   REMOVAL OF IMPELLA LEFT VENTRICULAR ASSIST DEVICE N/A 02/08/2018   Procedure: REMOVAL OF IMPELLA LEFT VENTRICULAR ASSIST DEVICE;  Surgeon: Gardenia Jump, MD;  Location: [facility] OR;  Service: Open Heart Surgery;  Laterality: N/A;   RIGHT HEART CATH N/A 02/03/2018   Procedure: RIGHT HEART CATH;  Surgeon: Swaziland, Peter M, MD;  Location: [facility] INVASIVE  CV LAB;  Service: Cardiovascular;  Laterality: N/A;   RIGHT HEART CATH N/A 05/09/2018   Procedure: RIGHT HEART CATH;  Surgeon: Mardell Shade, MD;  Location: [facility] INVASIVE CV LAB;  Service: Cardiovascular;  Laterality: N/A;   TEE WITHOUT CARDIOVERSION N/A 02/08/2018   Procedure: TRANSESOPHAGEAL ECHOCARDIOGRAM (TEE);  Surgeon: Gardenia Jump, MD;  Location: [facility] OR;  Service: Open Heart Surgery;  Laterality: N/A;   TUBAL LIGATION     VIDEO BRONCHOSCOPY WITH ENDOBRONCHIAL ULTRASOUND N/A 03/11/2014   Procedure: VIDEO BRONCHOSCOPY WITH ENDOBRONCHIAL ULTRASOUND;  Surgeon: Zelphia Higashi, MD;  Location: [facility] OR;  Service: Thoracic;  Laterality: N/A;    Social History:  reports that she quit smoking about 10 years ago. Her smoking use included cigarettes. She started smoking about 30 years ago. She has a 20 pack-year smoking history. She has never used smokeless tobacco. She reports current alcohol use of about 5.0 standard drinks of alcohol per week. She reports current drug use. Drug: Marijuana.  Allergies  Allergen Reactions   Lactose Intolerance (Gi) Diarrhea   Codeine Nausea And Vomiting    Family History  Problem Relation Age of Onset   Heart disease Mother    Hypertension Mother    Heart attack Mother    Hypertension Maternal Grandmother    Cancer Maternal Grandmother    Diabetes Paternal Grandmother    Stroke Neg Hx      Prior to Admission medications   Medication Sig Start Date End Date Taking? Authorizing Provider  albuterol  (VENTOLIN  HFA) 108 (90 Base) MCG/ACT inhaler Inhale 2 puffs into the lungs every 6 (six) hours as needed for wheezing or shortness of breath. 06/20/22   Deforest Fast, MD  atorvastatin  (LIPITOR ) 80 MG tablet Take 1 tablet (80 mg total) by mouth daily. 08/06/23 11/20/24  Santa Cuba, MD  cyclobenzaprine  (FLEXERIL ) 10 MG tablet Take 1 tablet (10 mg total) by mouth 3 (three) times daily as needed for muscle spasms. Patient taking differently: Take 0.5 mg by  mouth daily as needed for muscle spasms. 10/11/23   Abraham Abo, MD  digoxin  (LANOXIN ) 0.125 MG tablet TAKE 1 TABLET BY MOUTH DAILY Patient taking differently: Take 125 mcg by mouth at bedtime. 04/23/23   [name], Arlice Bene, FNP  ELIQUIS  2.5 MG TABS tablet TAKE 1 TABLET BY MOUTH TWICE A DAY 12/07/23   Bensimhon, Rheta Celestine, MD  FARXIGA  10 MG TABS tablet TAKE 1 TABLET BY MOUTH EVERY DAY 11/21/23   Bensimhon, Rheta Celestine, MD  Fluticasone -Umeclidin-Vilant (TRELEGY ELLIPTA ) 200-62.5-25 MCG/ACT AEPB Inhale 1 puff into the lungs daily. 02/27/24   Abraham Abo, MD  hydrOXYzine  (VISTARIL ) 25 MG capsule TAKE 1 CAPSULE (25 MG TOTAL) BY MOUTH DAILY AS NEEDED. 07/30/23   Senaida Dama, NP  ipratropium-albuterol  (DUONEB) 0.5-2.5 (3) MG/3ML SOLN TAKE 3 MLS BY NEBULIZATION EVERY 4 (FOUR)  HOURS AS NEEDED (FOR SHORTNESS OF BREATH). 02/22/24   Abraham Abo, MD  ivabradine  (CORLANOR ) 7.5 MG TABS tablet Take 1 tablet (7.5 mg total) by mouth 2 (two) times daily with a meal. 02/26/24   Bensimhon, Rheta Celestine, MD  megestrol  (MEGACE ) 20 MG tablet Take 1 tablet (20 mg total) by mouth 2 (two) times daily. 02/26/24   Abraham Abo, MD  metolazone  (ZAROXOLYN ) 2.5 MG tablet Take 1 tablet (2.5 mg total) by mouth once a week. on Tuesday starting 01/15/2024 with 40 mEq of Potassium 3 times per day on metolazone  days 01/14/24   Elmarie Hacking, FNP  midodrine  (PROAMATINE ) 5 MG tablet Take 1 tablet (5 mg total) by mouth 3 (three) times daily with meals. 12/22/23   Ghimire, Estil Heman, MD  nitroGLYCERIN  (NITROSTAT ) 0.4 MG SL tablet Place 1 tablet (0.4 mg total) under the tongue every 5 (five) minutes as needed for chest pain. 03/13/24   Lee, [place], NP  Nutritional Supplements (ENSURE ORIGINAL) LIQD Take 1 Bottle by mouth 2 (two) times daily after a meal. Drink 1 bottle 2 times daily.    [provider]  potassium chloride  (KLOR-CON ) 20 MEQ packet Take 40 mEq by mouth daily.   Dissolve two ( ) packets in 4-6oz of fluid three times  a day when taking Metolazone  2.5mg .    [provider]  spironolactone  (ALDACTONE ) 25 MG tablet Take 1 tablet (25 mg total) by mouth at bedtime. 12/22/23 03/21/24  Ghimire, Estil Heman, MD  torsemide  (DEMADEX ) 20 MG tablet Take 5 tablets (100 mg total) by mouth 2 (two) times daily. 03/04/24   Elmarie Hacking, FNP    Physical Exam:  Vitals:   04/01/24 0605 04/01/24 0609 04/01/24 0610 04/01/24 0635  BP:  (!) 140/93    Pulse:  (!) 119    Resp:  (!) 36    Temp:    98.3 F (36.8 C)  TempSrc:    Rectal  SpO2: 100% 100%    Weight:   46 kg    Wt Readings from Last 3 Encounters:  04/01/24 46 kg  03/13/24 44.8 kg  02/26/24 44.3 kg   Body mass index is 20.14 kg/m.  General:  Average built, in moderate distress on BiPAP, Communicative, appears weak and deconditioned and debilitated from HENT: Normocephalic, No scleral pallor or icterus noted. Oral mucosa is moist.  Chest: Diminished breath bilaterally, coarse breath sounds noted mild wheezes. CVS: S1 &S2 heard. No murmur.  Regular rate and rhythm. Abdomen: Soft, nontender, nondistended.  Bowel sounds are heard. No abdominal mass palpated Extremities: No cyanosis, clubbing or edema.  Peripheral pulses are palpable. Psych: Alert, awake and oriented, anxious mood, CNS:  No cranial nerve deficits.  Power equal in all extremities.  Generalized weakness noted Skin: Warm and dry.  No rashes noted.  Labs on Admission:   CBC: Recent Labs  Lab 04/01/24 0618 04/01/24 0619 04/01/24 0625  WBC  --   --  13.5*  NEUTROABS  --   --  9.9*  HGB 13.9 14.6 13.1  HCT 41.0 43.0 42.7  MCV  --   --  94.9  PLT  --   --  247    Basic Metabolic Panel: Recent Labs  Lab 04/01/24 0618 04/01/24 0619 04/01/24 0625  NA 139 140 138  K 3.8 3.7 3.3*  CL  --  107 102  CO2  --   --  21*  GLUCOSE  --  332* 314*  BUN  --  35* 26*  CREATININE  --  1.80* 1.71*  CALCIUM   --   --  9.5    Liver Function Tests: No results for input(s): "AST", "ALT",  "ALKPHOS", "BILITOT", "PROT", "ALBUMIN " in the last 168 hours. No results for input(s): "LIPASE", "AMYLASE" in the last 168 hours. No results for input(s): "AMMONIA" in the last 168 hours.  Cardiac Enzymes: No results for input(s): "CKTOTAL", "CKMB", "CKMBINDEX", "TROPONINI" in the last 168 hours.  BNP (last 3 results) Recent Labs    02/14/24 2132 03/13/24 1127 04/01/24 0625  BNP 2,541.1* 1,892.9* 4,049.6*    ProBNP (last 3 results) No results for input(s): "PROBNP" in the last 8760 hours.  CBG: No results for input(s): "GLUCAP" in the last 168 hours.  Lipase     Component Value Date/Time   LIPASE 55 (H) 05/23/2023 1524     Urinalysis    Component Value Date/Time   COLORURINE YELLOW 05/23/2023 1519   APPEARANCEUR CLEAR 05/23/2023 1519   LABSPEC 1.013 05/23/2023 1519   PHURINE 6.0 05/23/2023 1519   GLUCOSEU NEGATIVE 05/23/2023 1519   HGBUR NEGATIVE 05/23/2023 1519   BILIRUBINUR NEGATIVE 05/23/2023 1519   KETONESUR NEGATIVE 05/23/2023 1519   PROTEINUR NEGATIVE 05/23/2023 1519   UROBILINOGEN 1.0 03/15/2015 1117   NITRITE NEGATIVE 05/23/2023 1519   LEUKOCYTESUR NEGATIVE 05/23/2023 1519     Drugs of Abuse     Component Value Date/Time   LABOPIA POSITIVE (A) 04/20/2023 2325   COCAINSCRNUR NONE DETECTED 04/20/2023 2325   LABBENZ NONE DETECTED 04/20/2023 2325   AMPHETMU NONE DETECTED 04/20/2023 2325   THCU NONE DETECTED 04/20/2023 2325   LABBARB NONE DETECTED 04/20/2023 2325      Radiological Exams on Admission: DG Chest Portable 1 View Result Date: 04/01/2024 CLINICAL DATA:  Shortness of breath. EXAM: PORTABLE CHEST 1 VIEW COMPARISON:  Radiograph 02/16/2024, CT 02/15/2019 FINDINGS: Prior median sternotomy. Left-sided pacemaker and right neurostimulator in place. Stable heart size and mediastinal contours. Chronic hyperinflation and bronchial thickening. Increased interstitial opacities in the left lung suspicious for asymmetric edema. Blunting of the costophrenic  angles is likely due to hyperinflation rather than pleural effusions and compared with prior CT. No pneumothorax. IMPRESSION: 1. Increased interstitial opacities in the left lung suspicious for asymmetric edema. 2. Chronic hyperinflation and bronchial thickening. Electronically Signed   By: Chadwick Colonel M.D.   On: 04/01/2024 10:01   CUP PACEART REMOTE DEVICE CHECK Result Date: 04/01/2024 ICD scheduled remote reviewed. Normal device function.  Presenting rhythm: VS Next remote 91 days. AB,CVRS    Consultant: Will consult advanced heart failure team  Code Status: DNR/I   Microbiology none  Antibiotics: Doxycycline  empirically  Family Communication:  Patients' condition and plan of care including tests being ordered have been discussed with the patient  who indicate understanding and agree with the plan.   Status is: Inpatient  Severity of Illness: The appropriate patient status for this patient is INPATIENT. Inpatient status is judged to be reasonable and necessary in order to provide the required intensity of service to ensure the patient's safety. The patient's presenting symptoms, physical exam findings, and initial radiographic and laboratory data in the context of their chronic comorbidities is felt to place them at high risk for further clinical deterioration. Furthermore, it is not anticipated that the patient will be medically stable for discharge from the hospital within 2 midnights of admission.   * I certify that at the point of admission it is my clinical judgment that the patient will require inpatient hospital care spanning beyond 2  midnights from the point of admission due to high intensity of service, high risk for further deterioration and high frequency of surveillance required.*  Signed, Rosena Conradi, MD [facility] 04/01/2024

## 2024-04-01 NOTE — ED Notes (Signed)
 Provider made aware the pacemaker interrogation papers are scanned into the chart

## 2024-04-01 NOTE — Progress Notes (Signed)
 Patient  insisted off on continuous BiPAP for the time being. Explained the importance ofbeing on BiPAP continuously still refused to be off on it. Patient stated "I know my body". Respiratory therapist informed, patient placed on a HFNC at 15lpm. Continued to monitor.

## 2024-04-01 NOTE — ED Notes (Signed)
 Potassium started at 50 mL/hr with NS at 75mL for dilution. Lower settings chosen b/c pt is in fluid overload.

## 2024-04-01 NOTE — ED Notes (Addendum)
 Pt off bipap at this time & on 2L [place]

## 2024-04-01 NOTE — ED Notes (Signed)
 Provider made aware of lactic of 3.0 and troponin of 94

## 2024-04-01 NOTE — Inpatient Diabetes Management (Signed)
 Inpatient Diabetes Program Recommendations  AACE/ADA: New Consensus Statement on Inpatient Glycemic Control (2015)  Target Ranges:  Prepandial:   less than 140 mg/dL      Peak postprandial:   less than 180 mg/dL (1-2 hours)      Critically ill patients:  140 - 180 mg/dL   Lab Results  Component Value Date   GLUCAP 243 (H) 02/19/2024   HGBA1C 7.2 (H) 02/16/2024    Review of Glycemic Control  Diabetes history: DM2 Outpatient Diabetes medications: Farxiga  10 mg daily Current orders for Inpatient glycemic control: Farxiga  10 mg daily, Solumedrol 40 mg B  HgbA1C - 7.2%  Inpatient Diabetes Program Recommendations:   Add Novolog  0-9 TID with meals  Follow.  Thank you. Joni Net, RD, LDN, CDCES Inpatient Diabetes Coordinator [phone]

## 2024-04-01 NOTE — Progress Notes (Signed)
 Placed back on continuous BiPAP.

## 2024-04-01 NOTE — ED Provider Notes (Signed)
 [facility]EMERGENCY [facility]PT [facility] Emergency Department Provider Note MRN:  [id number]  Arrival date & time: 04/01/24     Chief Complaint   Respiratory Distress   History of Present Illness   Brittany [name]  is a 63 y.o. year-old female presents to the ED with chief complaint of respiratory distress.  Patient has history of advanced heart failure.  She also has history of COPD.  She is brought in by EMS with 3 hours of short notice of breath.  She was unable to get to an inhaler.  She is brought in on CPAP.  She was noted to be pale and diaphoretic by EMS.  She received 2 breathing treatments as well as Solu-Medrol .  She is DNR/DNI.  History limited secondary to acuity of condition   Review of Systems  Pertinent positive and negative review of systems noted in HPI.    Physical Exam   Vitals:   04/01/24 0605 04/01/24 0609  BP:  (!) 140/93  Pulse:  (!) 119  Resp:  (!) 36  SpO2: 100% 100%    CONSTITUTIONAL:  ill-appearing, NAD NEURO: Drowsy EYES:  eyes equal and reactive ENT/NECK:  Supple, no stridor  CARDIO: Tachycardic, regular rhythm, appears well-perfused  PULM: Wheezes and rales throughout, respiratory distress GI/GU:  non-distended,  MSK/SPINE:  No gross deformities, no edema, moves all extremities  SKIN:  no rash, atraumatic, skin is cool, diaphoretic   *Additional and/or pertinent findings included in MDM below  Diagnostic and Interventional Summary    EKG Interpretation Date/Time:    Ventricular Rate:    PR Interval:    QRS Duration:    QT Interval:    QTC Calculation:   R Axis:      Text Interpretation:         Labs Reviewed  I-STAT VENOUS BLOOD GAS, ED - Abnormal; Notable for the following components:      Result Value   pO2, Ven 64 (*)    Calcium , Ion 1.08 (*)    All other components within normal limits  I-STAT CHEM 8, ED - Abnormal; Notable for the following components:   BUN 35 (*)    Creatinine, Ser 1.80 (*)    Glucose, Bld  332 (*)    Calcium , Ion 1.07 (*)    All other components within normal limits  I-STAT CG4 LACTIC ACID, ED - Abnormal; Notable for the following components:   Lactic Acid, Venous 3.1 (*)    All other components within normal limits  RESP PANEL BY RT-PCR (RSV, FLU A&B, COVID)  [id number]  CBC WITH DIFFERENTIAL/PLATELET  BASIC METABOLIC PANEL WITH GFR  BRAIN NATRIURETIC PEPTIDE  DIGOXIN  LEVEL  TROPONIN I (HIGH SENSITIVITY)    DG Chest Portable 1 View    (Results Pending)    Medications  furosemide  (LASIX ) injection 40 mg (40 mg Intravenous Given 04/01/24 8295)     Procedures  /  Critical Care .Critical Care  Performed by: Sherel Dikes, PA-C Authorized by: Sherel Dikes, PA-C   Critical care provider statement:    Critical care time (minutes):  35   Critical care was necessary to treat or prevent imminent or life-threatening deterioration of the following conditions:  Respiratory failure   Critical care was time spent personally by me on the following activities:  Development of treatment plan with patient or surrogate, discussions with consultants, evaluation of patient's response to treatment, examination of patient, ordering and review of laboratory studies, ordering and review of radiographic studies, ordering and performing treatments and interventions,  pulse oximetry, re-evaluation of patient's condition and review of old charts   ED Course and Medical Decision Making  I have reviewed the triage vital signs, the nursing notes, and pertinent available records from the EMR.  Social Determinants Affecting Complexity of Care: Patient has no clinically significant social determinants affecting this chief complaint..   ED Course:    Medical Decision Making Amount and/or Complexity of Data Reviewed Labs: ordered. Radiology: ordered. ECG/medicine tests: ordered.  Risk Prescription drug management.         Consultants:    Treatment and Plan: Patient's exam and  diagnostic results are concerning for acute respiratory distress.  Feel that patient will need admission to the hospital for further treatment and evaluation.  Patient seen by and discussed with attending physician, Dr. Wallis Gun, who agrees with plan.  Patient signed out to oncoming team, will follow-up on labs and call for admission.  Final Clinical Impressions(s) / ED Diagnoses  No diagnosis found.  ED Discharge Orders     None         Discharge Instructions Discussed with and Provided to Patient:   Discharge Instructions   None      Sherel Dikes, PA-C 04/01/24 [id number]    Eldon Greenland, MD 04/01/24 [phone]

## 2024-04-01 NOTE — ED Triage Notes (Signed)
 BIB EMS/ from home/ Shob x 3 hours/ hx of CHF, COPD/ wears C-pap frequently/ unknown RA baseline/ pt is pale and diaphoretic/

## 2024-04-01 NOTE — Consult Note (Addendum)
 Advanced Heart Failure Team Consult Note   Primary Physician: Abraham Abo, MD Cardiologist:  None  Reason for Consultation: Acute on chronic systolic heart failure  HPI:    Brittany [name]  is seen today for evaluation of acute on chronic systolic heart failure at the request of Dr. Gussie Legato, [facility] medicine.   Brittany [name]  is a 63 y.o. female with a history of PAF, CAD s/p emergent CABG in 2019 and DES in 2020, R renal infarct in 2022,  previous smoker quit in 2015, hypertension, previous small cell lung cancer treated with chemo, chest XRT and prophylactic brain radiation in 2015, and chronic systolic HF EF ~25%   Admitted 2/19 with NSTEMI and shock. Emergent LHC 02/03/18 showed LAD 100% stenosed, LCx 95% stenosed. Taken for emergent CABG 02/03/18. Required impella post op. Hospital course complicated by cardiogenic shock, HCAP, A fib, respiratory failure, and swallowing issues. She was discharged to SNF.    In 2019 had multiple hospitalizations for HF and pleural effusion. Underwent pleurodesis at [facility].   Admitted 3/20 with NSTEMI and HF. Received DES to ostial ramus into distal left main based on cath below and underwent diuresis. Meds adjusted as tolerated. Echo with EF 25-30%.     Admitted twice in 2023 with low output requiring milrinone . Barostim placed.    Multiple CHF and AECOPD admission in May, August, October of 2024 and Janurary 2025. Echo 1/25 EF down to < 20%, G2DD, RV moderately to severely reduced. She remained DNR.   Most recent admission 3/6-3/10/2024 for CHF and AECOPD.  Presented to the ED today with acute respiratory failure 2/2 acute on chronic systolic heart failure. She had SOB for a couple of hours this morning and she was unable to reach her inhaler so EMS was called. Placed on CPAP during transport. Admission labs reviewed: BNP 4K, K 3.3, SCr 1.8, Na 138, LA 3.1, Dig level <0.2, CXR with pulmonary edema. Given IV lasix .   Resting  comfortably in bed. Reports compliance with medications. Continues to smoke weed. States she has been avoiding food high in sodium. Wants CPAP off, asking to drink water.   Home Medications Prior to Admission medications   Medication Sig Start Date End Date Taking? Authorizing Provider  albuterol  (VENTOLIN  HFA) 108 (90 Base) MCG/ACT inhaler Inhale 2 puffs into the lungs every 6 (six) hours as needed for wheezing or shortness of breath. 06/20/22   Deforest Fast, MD  atorvastatin  (LIPITOR ) 80 MG tablet Take 1 tablet (80 mg total) by mouth daily. 08/06/23 11/20/24  Santa Cuba, MD  cyclobenzaprine  (FLEXERIL ) 10 MG tablet Take 1 tablet (10 mg total) by mouth 3 (three) times daily as needed for muscle spasms. Patient taking differently: Take 0.5 mg by mouth daily as needed for muscle spasms. 10/11/23   Abraham Abo, MD  digoxin  (LANOXIN ) 0.125 MG tablet TAKE 1 TABLET BY MOUTH DAILY Patient taking differently: Take 125 mcg by mouth at bedtime. 04/23/23   [name], Arlice Bene, FNP  ELIQUIS  2.5 MG TABS tablet TAKE 1 TABLET BY MOUTH TWICE A DAY 12/07/23   Bensimhon, Rheta Celestine, MD  FARXIGA  10 MG TABS tablet TAKE 1 TABLET BY MOUTH EVERY DAY 11/21/23   Bensimhon, Rheta Celestine, MD  Fluticasone -Umeclidin-Vilant (TRELEGY ELLIPTA ) 200-62.5-25 MCG/ACT AEPB Inhale 1 puff into the lungs daily. 02/27/24   Abraham Abo, MD  hydrOXYzine  (VISTARIL ) 25 MG capsule TAKE 1 CAPSULE (25 MG TOTAL) BY MOUTH DAILY AS NEEDED. 07/30/23   Senaida Dama, NP  ipratropium-albuterol  (DUONEB) 0.5-2.5 (3) MG/3ML  SOLN TAKE 3 MLS BY NEBULIZATION EVERY 4 (FOUR) HOURS AS NEEDED (FOR SHORTNESS OF BREATH). 02/22/24   Abraham Abo, MD  ivabradine  (CORLANOR ) 7.5 MG TABS tablet Take 1 tablet (7.5 mg total) by mouth 2 (two) times daily with a meal. 02/26/24   Bensimhon, Rheta Celestine, MD  megestrol  (MEGACE ) 20 MG tablet Take 1 tablet (20 mg total) by mouth 2 (two) times daily. 02/26/24   Abraham Abo, MD  metolazone  (ZAROXOLYN ) 2.5 MG tablet Take 1  tablet (2.5 mg total) by mouth once a week. on Tuesday starting 01/15/2024 with 40 mEq of Potassium 3 times per day on metolazone  days 01/14/24   Elmarie Hacking, FNP  midodrine  (PROAMATINE ) 5 MG tablet Take 1 tablet (5 mg total) by mouth 3 (three) times daily with meals. 12/22/23   Ghimire, Estil Heman, MD  nitroGLYCERIN  (NITROSTAT ) 0.4 MG SL tablet Place 1 tablet (0.4 mg total) under the tongue every 5 (five) minutes as needed for chest pain. 03/13/24   Lee, [place], NP  Nutritional Supplements (ENSURE ORIGINAL) LIQD Take 1 Bottle by mouth 2 (two) times daily after a meal. Drink 1 bottle 2 times daily.    [provider]  potassium chloride  (KLOR-CON ) 20 MEQ packet Take 40 mEq by mouth daily.   Dissolve two (20mEq) packets in 4-6oz of fluid three times a day when taking Metolazone  2.5mg .    [provider]  spironolactone  (ALDACTONE ) 25 MG tablet Take 1 tablet (25 mg total) by mouth at bedtime. 12/22/23 03/21/24  Ghimire, Estil Heman, MD  torsemide  (DEMADEX ) 20 MG tablet Take 5 tablets (100 mg total) by mouth 2 (two) times daily. 03/04/24   Elmarie Hacking, FNP    Past Medical History: Past Medical History:  Diagnosis Date   Acute respiratory failure (HCC) 05/05/2018   Acute systolic congestive heart failure (HCC) 02/03/2018   AICD (automatic cardioverter/defibrillator) present    Allergy    Anxiety    Asthma    DM2 (diabetes mellitus, type 2) (HCC) 10/20/2020   Hypertension    PAF (paroxysmal atrial fibrillation) (HCC)    Presence of permanent cardiac pacemaker    Prophylactic measure 08/03/14-08/19/14   Prophyl. 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   LIMA to LAD, SVG to D1, SVG to OM1, EVH via right thigh with implantation of Impella LD LVAD via direct aortic approach   Small cell lung cancer (HCC) 03/16/2014    Past Surgical History: Past Surgical History:  Procedure Laterality Date   BRONCHIAL BRUSHINGS  10/25/2020   Procedure: BRONCHIAL  BRUSHINGS;  Surgeon: Denson Flake, MD;  Location: [facility] ENDOSCOPY;  Service: Pulmonary;;   BRONCHIAL NEEDLE ASPIRATION BIOPSY  10/25/2020   Procedure: BRONCHIAL NEEDLE ASPIRATION BIOPSIES;  Surgeon: Denson Flake, MD;  Location: [facility] ENDOSCOPY;  Service: Pulmonary;;   CARDIAC DEFIBRILLATOR PLACEMENT  08/15/2018   MDT Visia AF MRI VR ICD implanted by Dr Dolly Fret for primary prevention of sudden   CESAREAN SECTION     CORONARY ARTERY BYPASS GRAFT N/A 02/03/2018   Procedure: CORONARY ARTERY BYPASS GRAFTING (CABG);  Surgeon: Gardenia Jump, MD;  Location: [facility] OR;  Service: Open Heart Surgery;  Laterality: N/A;  Time 3 using left internal mammary artery and endoscopically harvested right saphenous vein   CORONARY BALLOON ANGIOPLASTY N/A 02/03/2018   Procedure: CORONARY BALLOON ANGIOPLASTY;  Surgeon: Swaziland, Peter M, MD;  Location: [facility] INVASIVE CV LAB;  Service: Cardiovascular;  Laterality: N/A;   CORONARY STENT INTERVENTION N/A 02/12/2019  Procedure: CORONARY STENT INTERVENTION;  Surgeon: Wenona Hamilton, MD;  Location: [facility] INVASIVE CV LAB;  Service: Cardiovascular;  Laterality: N/A;   CORONARY/GRAFT ACUTE MI REVASCULARIZATION N/A 02/03/2018   Procedure: Coronary/Graft Acute MI Revascularization;  Surgeon: Swaziland, Peter M, MD;  Location: [facility] INVASIVE CV LAB;  Service: Cardiovascular;  Laterality: N/A;   ENDOBRONCHIAL ULTRASOUND N/A 10/25/2020   Procedure: ENDOBRONCHIAL ULTRASOUND;  Surgeon: Denson Flake, MD;  Location: [facility] ENDOSCOPY;  Service: Pulmonary;  Laterality: N/A;   FLEXIBLE BRONCHOSCOPY  10/25/2020   Procedure: FLEXIBLE BRONCHOSCOPY;  Surgeon: Denson Flake, MD;  Location: [facility] ENDOSCOPY;  Service: Pulmonary;;   IABP INSERTION N/A 02/03/2018   Procedure: IABP Insertion;  Surgeon: Swaziland, Peter M, MD;  Location: [facility] INVASIVE CV LAB;  Service: Cardiovascular;  Laterality: N/A;   INTRAOPERATIVE TRANSESOPHAGEAL ECHOCARDIOGRAM N/A 02/03/2018   Procedure: INTRAOPERATIVE TRANSESOPHAGEAL ECHOCARDIOGRAM;   Surgeon: Gardenia Jump, MD;  Location: [facility] OR;  Service: Open Heart Surgery;  Laterality: N/A;   LEFT HEART CATH AND CORONARY ANGIOGRAPHY N/A 02/03/2018   Procedure: LEFT HEART CATH AND CORONARY ANGIOGRAPHY;  Surgeon: Swaziland, Peter M, MD;  Location: [facility] INVASIVE CV LAB;  Service: Cardiovascular;  Laterality: N/A;   LEFT HEART CATH AND CORS/GRAFTS ANGIOGRAPHY N/A 02/12/2019   Procedure: LEFT HEART CATH AND CORS/GRAFTS ANGIOGRAPHY;  Surgeon: Wenona Hamilton, MD;  Location: [facility] INVASIVE CV LAB;  Service: Cardiovascular;  Laterality: N/A;   MEDIASTINOSCOPY N/A 03/11/2014   Procedure: MEDIASTINOSCOPY;  Surgeon: Zelphia Higashi, MD;  Location: [facility] OR;  Service: Thoracic;  Laterality: N/A;   PLACEMENT OF IMPELLA LEFT VENTRICULAR ASSIST DEVICE  02/03/2018   Procedure: PLACEMENT OF IMPELLA LEFT VENTRICULAR ASSIST DEVICE LD;  Surgeon: Gardenia Jump, MD;  Location: [facility] OR;  Service: Open Heart Surgery;;   REMOVAL OF IMPELLA LEFT VENTRICULAR ASSIST DEVICE N/A 02/08/2018   Procedure: REMOVAL OF IMPELLA LEFT VENTRICULAR ASSIST DEVICE;  Surgeon: Gardenia Jump, MD;  Location: [facility] OR;  Service: Open Heart Surgery;  Laterality: N/A;   RIGHT HEART CATH N/A 02/03/2018   Procedure: RIGHT HEART CATH;  Surgeon: Swaziland, Peter M, MD;  Location: [facility] INVASIVE CV LAB;  Service: Cardiovascular;  Laterality: N/A;   RIGHT HEART CATH N/A 05/09/2018   Procedure: RIGHT HEART CATH;  Surgeon: Mardell Shade, MD;  Location: [facility] INVASIVE CV LAB;  Service: Cardiovascular;  Laterality: N/A;   TEE WITHOUT CARDIOVERSION N/A 02/08/2018   Procedure: TRANSESOPHAGEAL ECHOCARDIOGRAM (TEE);  Surgeon: Gardenia Jump, MD;  Location: [facility] OR;  Service: Open Heart Surgery;  Laterality: N/A;   TUBAL LIGATION     VIDEO BRONCHOSCOPY WITH ENDOBRONCHIAL ULTRASOUND N/A 03/11/2014   Procedure: VIDEO BRONCHOSCOPY WITH ENDOBRONCHIAL ULTRASOUND;  Surgeon: Zelphia Higashi, MD;  Location: [facility] OR;  Service: Thoracic;  Laterality: N/A;    Family  History: Family History  Problem Relation Age of Onset   Heart disease Mother    Hypertension Mother    Heart attack Mother    Hypertension Maternal Grandmother    Cancer Maternal Grandmother    Diabetes Paternal Grandmother    Stroke Neg Hx     Social History: Social History   Socioeconomic History   Marital status: Married    Spouse name: Fredericka James [name]     Number of children: 1   Years of education: Not on file   Highest education level: Not on file  Occupational History   Occupation: disabled  Tobacco Use   Smoking status: Former    Current packs/day: 0.00    Average  packs/day: 1 pack/day for 20.0 years (20.0 ttl pk-yrs)    Types: Cigarettes    Start date: 03/13/1994    Quit date: 03/13/2014    Years since quitting: 10.0   Smokeless tobacco: Never  Vaping Use   Vaping status: Never Used  Substance and Sexual Activity   Alcohol use: Yes    Alcohol/week: 5.0 standard drinks of alcohol    Types: 5 Glasses of wine per week    Comment: a few glasses of wine weekly   Drug use: Yes    Types: Marijuana    Comment: "medicinal", not daily   Sexual activity: Never  Other Topics Concern   Not on file  Social History Narrative   Not on file   Social Drivers of Health   Financial Resource Strain: Medium Risk (02/11/2021)   Overall Financial Resource Strain (CARDIA)    Difficulty of Paying Living Expenses: Somewhat hard  Food Insecurity: No Food Insecurity (02/15/2024)   Hunger Vital Sign    Worried About Running Out of Food in the Last Year: Never true    Ran Out of Food in the Last Year: Never true  Transportation Needs: No Transportation Needs (02/15/2024)   PRAPARE - Administrator, Civil Service (Medical): No    Lack of Transportation (Non-Medical): No  Recent Concern: Transportation Needs - Unmet Transportation Needs (12/22/2023)   PRAPARE - Transportation    Lack of Transportation (Medical): Yes    Lack of Transportation (Non-Medical): Yes  Physical  Activity: Insufficiently Active (02/11/2021)   Exercise Vital Sign    Days of Exercise per Week: 2 days    Minutes of Exercise per Session: 10 min  Stress: Stress Concern Present (02/11/2021)   [employer] of Occupational Health - Occupational Stress Questionnaire    Feeling of Stress : To some extent  Social Connections: Unknown (02/11/2021)   Social Connection and Isolation Panel [NHANES]    Frequency of Communication with Friends and Family: More than three times a week    Frequency of Social Gatherings with Friends and Family: Twice a week    Attends Religious Services: Not on Marketing executive or Organizations: Not on file    Attends Banker Meetings: Never    Marital Status: Married    Allergies:  Allergies  Allergen Reactions   Lactose Intolerance (Gi) Diarrhea   Codeine Nausea And Vomiting    Objective:    Vital Signs:   Temp:  [98.3 F (36.8 C)] 98.3 F (36.8 C) (04/22 0635) Pulse Rate:  [84-119] 86 (04/22 1100) Resp:  [16-36] 18 (04/22 1100) BP: (113-140)/(68-93) 120/83 (04/22 1100) SpO2:  [100 %] 100 % (04/22 1100) FiO2 (%):  [40 %] 40 % (04/22 1143) Weight:  [46 kg] 46 kg (04/22 0610)    Weight change: Filed Weights   04/01/24 0610  Weight: 46 kg    Intake/Output:  No intake or output data in the 24 hours ending 04/01/24 1201   Physical Exam    General:  frail / elderly appearing.   HEENT: +CPAP Neck: supple. JVD ~16 cm.  Cor: PMI nondisplaced. Regular rate & rhythm. No rubs, gallops or murmurs. Lungs: rhonchi throughout Abdomen: soft, nontender, distended.  Extremities: no cyanosis, clubbing, rash, edema  Neuro: alert & oriented x 3. Moves all 4 extremities w/o difficulty. Affect flat.   Telemetry   NSR 90s (Personally reviewed)    EKG    With artifact 2/2 barostim. ST 110s  Labs   Basic Metabolic Panel: Recent Labs  Lab 04/01/24 0618 04/01/24 0619 04/01/24 0625  NA 139 140 138  K 3.8 3.7 3.3*  CL  --   107 102  CO2  --   --  21*  GLUCOSE  --  332* 314*  BUN  --  35* 26*  CREATININE  --  1.80* 1.71*  CALCIUM   --   --  9.5    Liver Function Tests: No results for input(s): "AST", "ALT", "ALKPHOS", "BILITOT", "PROT", "ALBUMIN " in the last 168 hours. No results for input(s): "LIPASE", "AMYLASE" in the last 168 hours. No results for input(s): "AMMONIA" in the last 168 hours.  CBC: Recent Labs  Lab 04/01/24 0618 04/01/24 0619 04/01/24 0625  WBC  --   --  13.5*  NEUTROABS  --   --  9.9*  HGB 13.9 14.6 13.1  HCT 41.0 43.0 42.7  MCV  --   --  94.9  PLT  --   --  247    Cardiac Enzymes: No results for input(s): "CKTOTAL", "CKMB", "CKMBINDEX", "TROPONINI" in the last 168 hours.  BNP: BNP (last 3 results) Recent Labs    02/14/24 2132 03/13/24 1127 04/01/24 0625  BNP 2,541.1* 1,892.9* 4,049.6*    ProBNP (last 3 results) No results for input(s): "PROBNP" in the last 8760 hours.   CBG: No results for input(s): "GLUCAP" in the last 168 hours.  Coagulation Studies: No results for input(s): "LABPROT", "INR" in the last 72 hours.   Imaging   DG Chest Portable 1 View Result Date: 04/01/2024 CLINICAL DATA:  Shortness of breath. EXAM: PORTABLE CHEST 1 VIEW COMPARISON:  Radiograph 02/16/2024, CT 02/15/2019 FINDINGS: Prior median sternotomy. Left-sided pacemaker and right neurostimulator in place. Stable heart size and mediastinal contours. Chronic hyperinflation and bronchial thickening. Increased interstitial opacities in the left lung suspicious for asymmetric edema. Blunting of the costophrenic angles is likely due to hyperinflation rather than pleural effusions and compared with prior CT. No pneumothorax. IMPRESSION: 1. Increased interstitial opacities in the left lung suspicious for asymmetric edema. 2. Chronic hyperinflation and bronchial thickening. Electronically Signed   By: Chadwick Colonel M.D.   On: 04/01/2024 10:01    Medications:   Current Medications:  apixaban    2.5 mg Oral BID   atorvastatin   80 mg Oral Daily   digoxin   125 mcg Oral Daily   doxycycline   100 mg Oral Q12H   methylPREDNISolone  (SOLU-MEDROL ) injection  40 mg Intravenous BID   midodrine   5 mg Oral TID WC   sodium chloride  flush  3 mL Intravenous Q12H    Infusions:  sodium chloride      potassium chloride       Patient Profile   Ms Brittany [name]  is a 63 year old with a history of  PAF,  previous smoker quit 2015  years ago, hypertension, 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 Admitted with A/C respiratory failure 2/2 A/C HFrEF.   Assessment/Plan  Acute on chronic systolic heart failure - EF has been 20-25% for the last 5 years.  - S/p Medtronic ICD and Barostim - Echo 1/25: EF < 20%, mod to severely reduced RV (in setting of RSV) - NYHA IV. Functional class confounded by deconditioning and COPD.  - Volume overloaded on exam. Will ask ED to interrogate device. Increase diuretics to  80 IV BID.  - GDMT limited by need for BP support with midodrine  - Lactic acid 3.1 on admission, recheck - Continue midodrine  5 mg tid. BP too low to wean. - Continue Farxiga  10 mg daily.  - Continue digoxin  0.125 mg daily. Level stable today.  - Continue ivabradine  7.5 mg bid.  - Continue spironolactone  25 mg daily.  - Not a candidate for advanced therapies due to severity of lung disease.   Acute on chronic hypoxic respiratory failure - Hx frequent admissions for AE COPD + A/C HFrEF.  - required CPAP on admission - Remains on CPAP on assessment. RN getting ready to switch to  - Continue inhalers   CKD stage IIIb - Creatinine baseline ~ 1.4-1.6  - SCr 1.7 today. Follow with diuresis - Avoid hypotension  PAF - Regular on exam today.  - Continue Eliquis  2.5 mg bid. Reduced dose (weight/creatinine).  CAD -  History of CABG x 2 2019 - s/p DES ostial ramus extending to left main (2020) - No chest pain.  - Continue atorvastatin  80 mg daily - No ASA with need for [facility]  H/o SCLC - completed treatment 2015 - CT chest (5/24) soft tissue thickening along mediastinum but no discrete mass   H/o renal infarct 22' - On Eliquis . No bleeding issues.   GOC/FTT - She has had a significant functional decline. - Consider continued GOC conversations - She is DNR/DNI  Length of Stay: 0  Sheryl Donna, NP  04/01/2024, 12:01 PM  Advanced Heart Failure Team Pager [phone] (M-F; 7a - 5p)  Please contact [facility] for night-coverage after hours (4p -7a ) and weekends on [email or web address]

## 2024-04-01 NOTE — Progress Notes (Signed)
 Heart Failure Navigator Progress Note  Assessed for Heart & Vascular TOC clinic readiness.  Patient does not meet criteria due to Advanced Heart Failure Team patient of Dr. [name]. .   Navigator will sign off at this time.   Rhae Hammock, BSN, Scientist, clinical (histocompatibility and immunogenetics) Only

## 2024-04-01 NOTE — Hospital Course (Signed)
 Brittany [name]  is a 63 y.o. female with a past history of advanced heart failure, PAF, CAD s/p emergent CABG in 2019 and DES in 2020, R renal infarct in 2022,  hypertension, previous small cell lung cancer treated with chemo, XRT and prophylactic brain radiation in 2015, and chronic systolic HF EF ~25%, history of pleurodesis in [facility], with recurrent hospitalizations in the past presented to hospital this time with acute respiratory distress.  History of admission to the hospital twice in 2023 with low output requiring milrinone . Multiple CHF and AECOPD admission in May, August, October of 2024 and Janurary 2025.  Patient stated that she had shortness of breath for 2 to 3 hours at home and was unable to get her inhaler.  Patient was then brought into the hospital with CPAP.  EMS noted that the patient was pale and diaphoretic and received 2 breathing treatments as well as IV Solu-Medrol .  In the ED, CBC showed leukocytosis at 13.5.  ENT was more than 4000.  Troponin of 64.  Potassium was low at 3.3.  Creatinine was 1.7.  Chest x-ray showed vascular congestion.  Lactate was elevated at 3.1.  It EEG did not show any acute ischemic changes.  Patient received 60 mEq of IV Lasix  and received oral potassium as well.   Acute respiratory distress secondary to acute on chronic systolic heart failure exacerbation. Patient with recurrent admissions for the same.  Status post AICD and Barostim in the past.  Last 2D echocardiogram from 12/15/2023 with LV ejection fraction of less than 20% with global hypokinesis and grade 2 diastolic dysfunction.  Seen by cardiology as outpatient.  Was on midodrine  in the past including metolazone  torsemide  Farxiga  digoxin  ivabradine  and spironolactone .  Was considered to be not a candidate for advanced therapies due to severity of lung disease.  Will consult heart failure team.  Digoxin  level less than 3.2.  Chronic hypoxic respiratory failure secondary to COPD. Continue  bronchodilators.  Required BiPAP in the ED.  Continue IV Solu-Medrol ,  Elevated troponin likely secondary to demand ischemia.  Hypokalemia.  Potassium of 3.3.  Will receive oral potassium.  Check BMP in AM.   CKD Stage IIIb  Creatinine baseline ~ 1.4-1.6.  Creatinine on presentation at 1.7.  Continue to monitor closely while on IV diuretics.  Paroxysmal atrial fibrillation. On liquids at home.   CAD with history of CABG 2019. Continue Lipitor .   H/o SCLC - Completed treatment 2015.    Failure to thrive, recurrent hospitalization. Patient is DNR/DNI.  Will continue to have goals of care discussion.

## 2024-04-02 ENCOUNTER — Inpatient Hospital Stay (HOSPITAL_COMMUNITY)

## 2024-04-02 ENCOUNTER — Other Ambulatory Visit: Payer: Self-pay

## 2024-04-02 DIAGNOSIS — I5023 Acute on chronic systolic (congestive) heart failure: Secondary | ICD-10-CM | POA: Diagnosis not present

## 2024-04-02 DIAGNOSIS — N1831 Chronic kidney disease, stage 3a: Secondary | ICD-10-CM

## 2024-04-02 DIAGNOSIS — J810 Acute pulmonary edema: Secondary | ICD-10-CM

## 2024-04-02 DIAGNOSIS — J9621 Acute and chronic respiratory failure with hypoxia: Secondary | ICD-10-CM | POA: Diagnosis not present

## 2024-04-02 DIAGNOSIS — J9600 Acute respiratory failure, unspecified whether with hypoxia or hypercapnia: Secondary | ICD-10-CM

## 2024-04-02 DIAGNOSIS — N179 Acute kidney failure, unspecified: Secondary | ICD-10-CM

## 2024-04-02 DIAGNOSIS — J9601 Acute respiratory failure with hypoxia: Secondary | ICD-10-CM | POA: Diagnosis not present

## 2024-04-02 DIAGNOSIS — E1165 Type 2 diabetes mellitus with hyperglycemia: Secondary | ICD-10-CM

## 2024-04-02 DIAGNOSIS — J441 Chronic obstructive pulmonary disease with (acute) exacerbation: Secondary | ICD-10-CM

## 2024-04-02 DIAGNOSIS — I5043 Acute on chronic combined systolic (congestive) and diastolic (congestive) heart failure: Secondary | ICD-10-CM

## 2024-04-02 DIAGNOSIS — I480 Paroxysmal atrial fibrillation: Secondary | ICD-10-CM

## 2024-04-02 LAB — CBC
HCT: 32.8 % — ABNORMAL LOW (ref 36.0–46.0)
Hemoglobin: 10.6 g/dL — ABNORMAL LOW (ref 12.0–15.0)
MCH: 29.3 pg (ref 26.0–34.0)
MCHC: 32.3 g/dL (ref 30.0–36.0)
MCV: 90.6 fL (ref 80.0–100.0)
Platelets: 185 10*3/uL (ref 150–400)
RBC: 3.62 MIL/uL — ABNORMAL LOW (ref 3.87–5.11)
RDW: 17.9 % — ABNORMAL HIGH (ref 11.5–15.5)
WBC: 10.3 10*3/uL (ref 4.0–10.5)
nRBC: 0 % (ref 0.0–0.2)

## 2024-04-02 LAB — HEPATIC FUNCTION PANEL
ALT: 16 U/L (ref 0–44)
AST: 25 U/L (ref 15–41)
Albumin: 3.5 g/dL (ref 3.5–5.0)
Alkaline Phosphatase: 61 U/L (ref 38–126)
Bilirubin, Direct: 0.1 mg/dL (ref 0.0–0.2)
Indirect Bilirubin: 0.7 mg/dL (ref 0.3–0.9)
Total Bilirubin: 0.8 mg/dL (ref 0.0–1.2)
Total Protein: 6.7 g/dL (ref 6.5–8.1)

## 2024-04-02 LAB — GLUCOSE, CAPILLARY
Glucose-Capillary: 276 mg/dL — ABNORMAL HIGH (ref 70–99)
Glucose-Capillary: 313 mg/dL — ABNORMAL HIGH (ref 70–99)
Glucose-Capillary: 173 mg/dL — ABNORMAL HIGH (ref 70–99)

## 2024-04-02 LAB — COOXEMETRY PANEL
Carboxyhemoglobin: 2.2 % — ABNORMAL HIGH (ref 0.5–1.5)
Methemoglobin: <0.7 % (ref 0.0–1.5)
O2 Saturation: 82.9 %
Total hemoglobin: 11.3 g/dL — ABNORMAL LOW (ref 12.0–16.0)

## 2024-04-02 LAB — ECHOCARDIOGRAM COMPLETE
AV Mean grad: 8 mmHg
AV Peak grad: 15.8 mmHg
Ao pk vel: 1.99 m/s
Area-P 1/2: 7.7 cm2
Est EF: <20
Height: 59.5 in
S' Lateral: 5.3 cm
Weight: 1671.97 [oz_av]

## 2024-04-02 LAB — BASIC METABOLIC PANEL WITH GFR
Anion gap: 10 (ref 5–15)
BUN: 31 mg/dL — ABNORMAL HIGH (ref 8–23)
CO2: 23 mmol/L (ref 22–32)
Calcium: 9.4 mg/dL (ref 8.9–10.3)
Chloride: 103 mmol/L (ref 98–111)
Creatinine, Ser: 1.61 mg/dL — ABNORMAL HIGH (ref 0.44–1.00)
GFR, Estimated: 36 mL/min — ABNORMAL LOW (ref >60)
Glucose, Bld: 282 mg/dL — ABNORMAL HIGH (ref 70–99)
Potassium: 3.9 mmol/L (ref 3.5–5.1)
Sodium: 136 mmol/L (ref 135–145)

## 2024-04-02 LAB — PROTIME-INR
INR: 1.5 — ABNORMAL HIGH (ref 0.8–1.2)
Prothrombin Time: 18.2 s — ABNORMAL HIGH (ref 11.4–15.2)

## 2024-04-02 LAB — MAGNESIUM: Magnesium: 2.4 mg/dL (ref 1.7–2.4)

## 2024-04-02 LAB — PROCALCITONIN: Procalcitonin: 2.82 ng/mL

## 2024-04-02 MED ORDER — LORAZEPAM 2 MG/ML IJ SOLN
0.5000 mg | Freq: Once | INTRAMUSCULAR | Status: AC
  Administered 2024-04-02: 0.5 mg via INTRAVENOUS

## 2024-04-02 MED ORDER — CHLORHEXIDINE GLUCONATE CLOTH 2 % EX PADS
6.0000 | MEDICATED_PAD | Freq: Every day | CUTANEOUS | Status: DC
  Administered 2024-04-02 – 2024-04-07 (×6): 6 via TOPICAL

## 2024-04-02 MED ORDER — REVEFENACIN 175 MCG/3ML IN SOLN
175.0000 ug | Freq: Every day | RESPIRATORY_TRACT | Status: DC
  Administered 2024-04-02 – 2024-04-07 (×6): 175 ug via RESPIRATORY_TRACT
  Filled 2024-04-02 (×6): qty 3

## 2024-04-02 MED ORDER — POTASSIUM CHLORIDE CRYS ER 20 MEQ PO TBCR
40.0000 meq | EXTENDED_RELEASE_TABLET | Freq: Once | ORAL | Status: DC
  Filled 2024-04-02: qty 2

## 2024-04-02 MED ORDER — INSULIN GLARGINE-YFGN 100 UNIT/ML ~~LOC~~ SOLN
8.0000 [IU] | Freq: Two times a day (BID) | SUBCUTANEOUS | Status: DC
  Administered 2024-04-02: 8 [IU] via SUBCUTANEOUS
  Filled 2024-04-02 (×3): qty 0.08

## 2024-04-02 MED ORDER — POTASSIUM CHLORIDE 20 MEQ PO PACK
40.0000 meq | PACK | Freq: Every day | ORAL | Status: DC
  Administered 2024-04-02 – 2024-04-07 (×6): 40 meq via ORAL
  Filled 2024-04-02 (×6): qty 2

## 2024-04-02 MED ORDER — MILRINONE LACTATE IN DEXTROSE 20-5 MG/100ML-% IV SOLN: 0.2500 ug/kg/min | INTRAVENOUS | Status: DC

## 2024-04-02 MED ORDER — FUROSEMIDE 10 MG/ML IJ SOLN
20.0000 mg/h | INTRAVENOUS | Status: DC
  Administered 2024-04-02 – 2024-04-04 (×4): 20 mg/h via INTRAVENOUS
  Filled 2024-04-02 (×4): qty 20

## 2024-04-02 MED ORDER — INSULIN ASPART 100 UNIT/ML IJ SOLN
0.0000 [IU] | Freq: Every day | INTRAMUSCULAR | Status: DC
Start: 2024-04-02 — End: 2024-04-07
  Administered 2024-04-03: 2 [IU] via SUBCUTANEOUS
  Administered 2024-04-04 – 2024-04-05 (×2): 3 [IU] via SUBCUTANEOUS
  Administered 2024-04-06: 5 [IU] via SUBCUTANEOUS

## 2024-04-02 MED ORDER — SODIUM CHLORIDE 0.9 % IV SOLN
2.0000 g | INTRAVENOUS | Status: DC
  Administered 2024-04-02 – 2024-04-05 (×4): 2 g via INTRAVENOUS
  Filled 2024-04-02 (×4): qty 20

## 2024-04-02 MED ORDER — SODIUM CHLORIDE 0.9% FLUSH: 10.0000 mL | INTRAVENOUS | Status: DC | PRN

## 2024-04-02 MED ORDER — INSULIN ASPART 100 UNIT/ML IJ SOLN
0.0000 [IU] | Freq: Three times a day (TID) | INTRAMUSCULAR | Status: DC
  Administered 2024-04-02 – 2024-04-03 (×2): 8 [IU] via SUBCUTANEOUS
  Administered 2024-04-03: 2 [IU] via SUBCUTANEOUS
  Administered 2024-04-04: 3 [IU] via SUBCUTANEOUS

## 2024-04-02 MED ORDER — FUROSEMIDE 10 MG/ML IJ SOLN
160.0000 mg | Freq: Two times a day (BID) | INTRAVENOUS | Status: DC
  Administered 2024-04-02: 160 mg via INTRAVENOUS
  Filled 2024-04-02 (×3): qty 16

## 2024-04-02 MED ORDER — METHYLPREDNISOLONE SODIUM SUCC 40 MG IJ SOLR: 40.0000 mg | Freq: Every day | INTRAMUSCULAR | Status: DC

## 2024-04-02 MED ORDER — DOBUTAMINE-DEXTROSE 4-5 MG/ML-% IV SOLN
2.5000 ug/kg/min | INTRAVENOUS | Status: DC
  Administered 2024-04-02 – 2024-04-05 (×2): 2.5 ug/kg/min via INTRAVENOUS
  Filled 2024-04-02 (×2): qty 250

## 2024-04-02 MED ORDER — SALINE SPRAY 0.65 % NA SOLN
1.0000 | NASAL | Status: DC | PRN
  Filled 2024-04-02: qty 44

## 2024-04-02 MED ORDER — ARFORMOTEROL TARTRATE 15 MCG/2ML IN NEBU
15.0000 ug | INHALATION_SOLUTION | Freq: Two times a day (BID) | RESPIRATORY_TRACT | Status: DC
  Administered 2024-04-02 – 2024-04-07 (×11): 15 ug via RESPIRATORY_TRACT
  Filled 2024-04-02 (×11): qty 2

## 2024-04-02 MED ORDER — SODIUM CHLORIDE 0.9% FLUSH
10.0000 mL | Freq: Two times a day (BID) | INTRAVENOUS | Status: DC
  Administered 2024-04-02 – 2024-04-07 (×8): 10 mL

## 2024-04-02 MED ORDER — LORAZEPAM 2 MG/ML IJ SOLN
INTRAMUSCULAR | Status: AC
  Filled 2024-04-02: qty 1

## 2024-04-02 MED ORDER — DEXTROSE 5 % IV SOLN
500.0000 mg | Freq: Once | INTRAVENOUS | Status: AC
  Administered 2024-04-02: 500 mg via INTRAVENOUS
  Filled 2024-04-02 (×2): qty 17.8

## 2024-04-02 MED ORDER — BUDESONIDE 0.5 MG/2ML IN SUSP
0.5000 mg | Freq: Two times a day (BID) | RESPIRATORY_TRACT | Status: DC
  Administered 2024-04-02 – 2024-04-07 (×11): 0.5 mg via RESPIRATORY_TRACT
  Filled 2024-04-02 (×11): qty 2

## 2024-04-02 NOTE — Progress Notes (Addendum)
 Advanced Heart Failure Rounding Note  Cardiologist: None  Chief Complaint: Shortness of breath Subjective:    Briefly of BiPAP overnight with increasing SOB.  Minimal UOP overnight. I/Os not complete. Weight up.   Breathing appears labored this morning on BiPAP. Tired, did not want to participate in ROS.   Objective:    Weight Range: 47.4 kg Body mass index is 20.75 kg/m.   Vital Signs:   Temp:  [97.7 F (36.5 C)-98.5 F (36.9 C)] 97.9 F (36.6 C) (04/23 0505) Pulse Rate:  [82-104] 104 (04/23 0505) Resp:  [15-36] 30 (04/23 0505) BP: (95-135)/(48-83) 123/74 (04/23 0505) SpO2:  [97 %-100 %] 100 % (04/23 0505) FiO2 (%):  [40 %] 40 % (04/23 0517) Weight:  [46.3 kg-47.4 kg] 47.4 kg (04/23 0349) Last BM Date : 03/31/24  Weight change: Filed Weights   04/01/24 0610 04/01/24 2118 04/02/24 0349  Weight: 46 kg 46.3 kg 47.4 kg   Intake/Output:  Intake/Output Summary (Last 24 hours) at 04/02/2024 0703 Last data filed at 04/02/2024 0352 Gross per 24 hour  Intake 240 ml  Output 900 ml  Net -660 ml    Physical Exam    General: Chronically ill/frail appearing. Appease labored on BiPAP Cardiac: JVP above jaw. S1 and S2 present. No murmurs or rub. Resp: Coarse crackles, rhonchi  Abdomen: Soft, non-tender, distended.  Extremities: Cool and dry.  No peripheral edema.  Neuro: Alert and oriented x3. Affect pleasant. Moves all extremities without difficulty.  Telemetry   SR 90s (personally reviewed)  EKG    No new ECG to review  Labs    CBC Recent Labs    04/01/24 0625 04/02/24 0333  WBC 13.5* 10.3  NEUTROABS 9.9*  --   HGB 13.1 10.6*  HCT 42.7 32.8*  MCV 94.9 90.6  PLT 247 185   Basic Metabolic Panel Recent Labs    16/10/96 0625 04/02/24 0333  NA 138 136  K 3.3* 3.9  CL 102 103  CO2 21* 23  GLUCOSE 314* 282*  BUN 26* 31*  CREATININE 1.71* 1.61*  CALCIUM  9.5 9.4   BNP: BNP (last 3 results) Recent Labs    02/14/24 2132 03/13/24 1127  04/01/24 0625  BNP 2,541.1* 1,892.9* 4,049.6*   Imaging   No results found.  Medications:    Scheduled Medications:  apixaban   2.5 mg Oral BID   atorvastatin   80 mg Oral Daily   dapagliflozin  propanediol  10 mg Oral Daily   digoxin   125 mcg Oral Daily   doxycycline   100 mg Oral Q12H   furosemide   80 mg Intravenous BID   methylPREDNISolone  (SOLU-MEDROL ) injection  40 mg Intravenous BID   midodrine   5 mg Oral TID WC   sodium chloride  flush  3 mL Intravenous Q12H   spironolactone   25 mg Oral Daily   Infusions:  sodium chloride      PRN Medications: sodium chloride , acetaminophen  **OR** acetaminophen , ipratropium-albuterol , polyethylene glycol, sodium chloride , sodium chloride  flush  Patient Profile   Ms. 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 Admitted with A/C respiratory failure 2/2 A/C HFrEF.   Assessment/Plan   Acute on chronic systolic heart failure - EF has been 20-25% for the last  5 years.  - S/p Medtronic ICD and Barostim - Echo 1/25: EF < 20%, mod to severely reduced RV (in setting of RSV) - NYHA IV. Functional class confounded by deconditioning and COPD.  - Lactic acid 3.1 on admit, remains at 3.0. Repeat tomorrow am , check LFTs today. Suspect low ouput. - Significantly volume up with minimal UOP. Increase Lasix  to 160 mg IV bid.  - GDMT limited by need for BP support with midodrine . - Continue midodrine  5 mg tid, unable to wean - Continue Farxiga  10 mg daily.  - Continue digoxin  0.125 mg daily.  - Continue spironolactone  25 mg daily.  - She is likely end-stage HF +/- COPD. Has not tolerate maximal dose diuretics in the outpatient and requiring frequent admissions for hypoxia and  exacerbation. Would recommend Palliative Care to continue goals of care discussions.    Acute on chronic hypoxic respiratory failure - Hx frequent admissions for AE COPD + A/C HFrEF.  - continues to require BiPAP, unable to wean to HFNC without increasing dyspnea - Continue inhalers, steroids, and diuresis - coarse crackles and rhonchi heard   CKD stage IIIb - Creatinine baseline ~ 1.4-1.6  - Follow with diuresis - Avoid hypotension   PAF - Regular on exam today.  - Continue Eliquis  2.5 mg bid. Reduced dose (weight/creatinine).   CAD - History of CABG x 2 2019 - s/p DES ostial ramus extending to left main (2020) - No chest pain.  - Continue atorvastatin  80 mg daily - No ASA with need for AC   H/o SCLC - completed treatment 2015 - CT chest (5/24) soft tissue thickening along mediastinum but no discrete mass    H/o renal infarct 22' - On Eliquis . No bleeding issues.    GOC/FTT - She has had a significant functional decline in the setting of multiple end-stage disease processes. She would benefit from continuing goals of care conversations.  - She is DNR/DNI  Length of Stay: 1  [place] Lee, NP  04/02/2024, 7:03 AM  Advanced Heart Failure Team Pager [phone] (M-F; 7a - 5p)  Please contact [facility] for night-coverage after hours (5p -7a ) and weekends on [email or web address]  Agree with above.  Remains SOB on BIPAP. Not responding well to IV lasix . Lactic acid elevated  General:  Ill-appearing. No resp difficulty audible gurgling HEENT: normal + Bipap Neck: supple. Jvp to ear Carotids 2+ bilat; no bruits. No lymphadenopathy or thryomegaly appreciated. Cor: Regular rate & rhythm. No rubs, gallops or murmurs. Lungs: diffuse rhonchi Abdomen: soft, nontender, mildly distended. No hepatosplenomegaly. No bruits or masses. Good bowel sounds. Extremities: no cyanosis, clubbing, rash, 1+ edema Neuro: alert & orientedx3, cranial nerves grossly intact. moves all 4 extremities w/o  difficulty. Affect pleasant  Appears to be in low output HF with significant respiratory distress and no response to iv lasix .   Will start dobutamine . If no response in 30 mins will move to ICU. Place PICC.   Have discussed possible intubation. She would be willing to accept as needed. Realizes that she may be difficult wean given degree of lung disease.  Continue nebs and steroids in setting of underlying lung disease and frequent COPD flares.   CRITICAL CARE Performed by: Jules Oar  Total critical care time: 45 minutes  Critical care time was exclusive of separately billable procedures and treating other patients.  Critical care was necessary to treat or prevent imminent or life-threatening deterioration.  Critical care was time spent personally by me (independent of midlevel providers  or residents) on the following activities: development of treatment plan with patient and/or surrogate as well as nursing, discussions with consultants, evaluation of patient's response to treatment, examination of patient, obtaining history from patient or surrogate, ordering and performing treatments and interventions, ordering and review of laboratory studies, ordering and review of radiographic studies, pulse oximetry and re-evaluation of patient's condition.  Jules Oar, MD  10:18 AM

## 2024-04-02 NOTE — Progress Notes (Signed)
 Patient off on BiPAP per patient request, complains of pain on her nose. Placed on HFNC at 15 lpm, O2 sat maintain at 100%. Patient shows no signs of acute distress. Respiratory therapist aware. Continued to monitor .

## 2024-04-02 NOTE — TOC Initial Note (Signed)
 Transition of Care [facility]) - Initial/Assessment Note    Patient Details  Name: Brittany [name]  MRN: [id number] Date of Birth: July 17, 1961  [facility] CM/SW Contact:    Ernst Heap Phone Number: [phone] 04/02/2024, 4:11 PM  Clinical Narrative:  HF CSW met with patient at bedside. Patient stated that she lives alone. Patient stated that she is currently involved with [facility] services, but could not remember the name of the agency. Patient stated that she has a bedside commode, shower seat, rollator, bipap, oxygen, etc. Patient stated that at this time she has no DME needs. Patient stated that she has a scale at home. Patient stated that she drives. Patient stated that she has support at home.   TOC will continue following.                        Patient Goals and CMS Choice            Expected Discharge Plan and Services                                              Prior Living Arrangements/Services                       Activities of Daily Living   ADL Screening (condition at time of admission) Independently performs ADLs?: Yes (appropriate for developmental age) Is the patient deaf or have difficulty hearing?: No Does the patient have difficulty seeing, even when wearing glasses/contacts?: No Does the patient have difficulty concentrating, remembering, or making decisions?: No  Permission Sought/Granted                  Emotional Assessment              Admission diagnosis:  Acute respiratory failure with hypoxia (HCC) [J96.01] Patient Active Problem List   Diagnosis Date Noted   Acute respiratory failure with hypoxia (HCC) 04/01/2024   History of CAD (coronary artery disease) 12/15/2023   Hx of hypokalemia 12/15/2023   Respiratory distress 12/15/2023   Chronic respiratory failure with hypoxia and hypercapnia (HCC) 10/11/2023   Acute on chronic systolic CHF (congestive heart failure) (HCC) 09/25/2023    Acute kidney injury superimposed on chronic kidney disease (HCC) 08/04/2023   Acute on chronic respiratory failure (HCC) 08/03/2023   Trichomonas infection 04/19/2023   Presence of heart assist device (HCC) 04/11/2023   COPD with acute exacerbation (HCC) 03/01/2023   CAD (coronary artery disease) 03/01/2023   Acute hypoxic respiratory failure (HCC) 11/14/2022   Sepsis (HCC) 11/14/2022   Influenza A with pneumonia 11/14/2022   Troponin level elevated 11/14/2022   History of lung cancer 11/14/2022   Acute combined systolic and diastolic CHF, NYHA class 4 (HCC) 07/09/2022   Malnutrition of moderate degree 06/20/2022   Lactic acidosis 04/12/2022   Prolonged QT interval 04/09/2022   Hypotension 04/09/2022   Chronic HFrEF (heart failure with reduced ejection fraction) (HCC) 12/11/2021   CHF exacerbation (HCC) 12/10/2021   Renal infarct (HCC) 12/10/2021   COPD exacerbation (HCC) 09/29/2021   Mixed diabetic hyperlipidemia associated with type 2 diabetes mellitus (HCC) 09/29/2021   CKD stage 3b, GFR 30-44 ml/min (HCC) 09/29/2021   Unstable angina (HCC) 02/09/2021   Hemoptysis 10/20/2020   Type 2 diabetes mellitus with hyperlipidemia (HCC) 10/20/2020   Acute on chronic  combined systolic and diastolic CHF (congestive heart failure) (HCC) 05/21/2020   Cardiomyopathy, ischemic 04/12/2020   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   NSTEMI (non-ST elevated myocardial infarction) (HCC) 02/12/2019   CHF (congestive heart failure) (HCC) 11/24/2018   Post-operative pain 09/10/2018   COPD (chronic obstructive pulmonary disease) (HCC) 09/04/2018   Recurrent pleural effusion on right 05/05/2018   Acute on chronic respiratory failure with hypoxia (HCC) 05/05/2018   Chronic respiratory failure with hypoxia (HCC)    Asthma    Anxiety    Allergy    HCAP (healthcare-associated pneumonia) 03/15/2015   Chest pain 03/15/2015   Small cell lung cancer (HCC) 03/16/2014    Protein-calorie malnutrition, severe (HCC) 03/14/2014   PAF (paroxysmal atrial fibrillation) (HCC) 03/12/2014   Essential hypertension 05/07/2012   PCP:  Abraham Abo, MD Pharmacy:   [id number] - [place], [place] - [address] [place] [address] Phone: [phone] Fax: [phone]     Social Drivers of Health (SDOH) Social History: SDOH Screenings   Food Insecurity: No Food Insecurity (04/01/2024)  Housing: Low Risk  (04/01/2024)  Transportation Needs: No Transportation Needs (04/01/2024)  Utilities: Not At Risk (04/01/2024)  Alcohol Screen: Low Risk  (02/11/2021)  Depression (PHQ2-9): Low Risk  (02/26/2024)  Financial Resource Strain: Medium Risk (02/11/2021)  Physical Activity: Insufficiently Active (02/11/2021)  Social Connections: Unknown (02/11/2021)  Stress: Stress Concern Present (02/11/2021)  Tobacco Use: Medium Risk (04/01/2024)   SDOH Interventions:     Readmission Risk Interventions    02/18/2024    3:51 PM 06/20/2022   10:00 AM  Readmission Risk Prevention Plan  Transportation Screening Complete Complete  PCP or Specialist Appt within 3-5 Days  Complete  HRI or Home Care Consult  Complete  Social Work Consult for Recovery Care Planning/Counseling  Complete  Palliative Care Screening  Not Applicable  Medication Review Oceanographer) Complete Complete  PCP or Specialist appointment within 3-5 days of discharge Complete   HRI or Home Care Consult Complete   Palliative Care Screening Not Applicable   Skilled Nursing Facility Not Applicable

## 2024-04-02 NOTE — Progress Notes (Signed)
 Pt transported to 2H02 on NIV/Bipap with no complications noted. Unit RT called and notified of pt's arrival.

## 2024-04-02 NOTE — Inpatient Diabetes Management (Addendum)
 Inpatient Diabetes Program Recommendations  AACE/ADA: New Consensus Statement on Inpatient Glycemic Control (2015)  Target Ranges:  Prepandial:   less than 140 mg/dL      Peak postprandial:   less than 180 mg/dL (1-2 hours)      Critically ill patients:  140 - 180 mg/dL   Lab Results  Component Value Date   GLUCAP 243 (H) 02/19/2024   HGBA1C 7.2 (H) 02/16/2024    Review of Glycemic Control  Latest Reference Range & Units 02/18/24 05:56 02/18/24 11:09 02/18/24 16:12 02/18/24 21:08 02/19/24 06:21 02/19/24 11:36  Glucose-Capillary 70 - 99 mg/dL 161 (H) 096 (H) 045 (H) 280 (H) 134 (H) 243 (H)   Diabetes history: DM2 Outpatient Diabetes medications: Farxiga  10 mg daily Current orders for Inpatient glycemic control:  Farxiga  10 mg daily, Solumedrol 40 mg B  HgbA1C - 7.2%  Inpatient Diabetes Program Recommendations:   -   Add CBGs achs -   Add Novolog  0-15 TID with meals + HS scale  Follow.  Thanks,  Eloise Hake RN, MSN, BC-ADM Inpatient Diabetes Coordinator Team Pager [phone] (8a-5p)

## 2024-04-02 NOTE — Progress Notes (Signed)
 Feels a little better Wants to take mask off Reports she is hungry   Co-ox 80s.  MAP good  Plan Cont supportive care Think it is reasonable to change BIPAP to PRN. If she can tolerate off BIPAP can think about diet

## 2024-04-02 NOTE — Progress Notes (Signed)
*  PRELIMINARY RESULTS* Echocardiogram 2D Echocardiogram has been performed.  Brittany [name] Brittany [name] 04/02/2024, 1:14 PM

## 2024-04-02 NOTE — Progress Notes (Signed)
*  PRELIMINARY RESULTS* Echocardiogram 2D Echocardiogram has been performed.  Brittany [name] 04/02/2024, 1:10 PM

## 2024-04-02 NOTE — Consult Note (Addendum)
 NAME:  Brittany [name] , MRN:  [id number], DOB:  July 31, 1961, LOS: 1 ADMISSION DATE:  04/01/2024, CONSULTATION DATE: 4/23 REFERRING MD: Alease Amend, CHIEF COMPLAINT: Respiratory failure  History of Present Illness:  63 year old female patient presented to the emergency room on 4/22 with rather acute onset of 2 to 3 hours shortness of breath.  Upon arrival was in acute distress pale diaphoretic wheezing received IV Solu-Medrol  bronchodilators placed on NIPPV.  Emergency room had a mild leukocytosis with white blood cell count 13.5 BNP was 4000 troponin 64 chest x-ray consistent with vascular congestion she was given IV Lasix  in addition to scheduled bronchodilators and started on oral doxycycline , IV Solu-Medrol  continued.  Advanced heart failure team was consulted, diuretics escalated felt not a candidate for advanced therapies given advanced lung disease, on 4/23 seen by a.m. rounding team from heart failure found to be more short of breath, weight was up, breathing was labored in spite of BiPAP, placed on low-dose dobutamine  transferred to the ICU emergently for acute on chronic respiratory failure.  Primary team discussed goals of care patient did not want intubation or CPR based on discussion with advanced heart failure team.  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  Chronic systolic heart failure with a EF 25%, hypertension, prior ICD, RV dysfunction  Significant Hospital Events: Including procedures, antibiotic start and stop dates in addition to other pertinent events   4/22 admitted with acute on chronic decompensated  biventricular heart failure, and possible exacerbation of COPD 4/23 moved to the ICU for progressive respiratory failure  Interim History / Subjective:  Appears comfortable on BiPAP, did get some Ativan  prior to transfer  Objective   Blood pressure 112/70, pulse 97, temperature 97.7 F (36.5 C), temperature source Oral, resp. rate 19, height 4' 11.5" (1.511 m), weight 47.4 kg, SpO2 100%.    Vent Mode: BIPAP;PCV FiO2 (%):  [40 %-50 %] 50 % Set Rate:  [15 bmp] 15 bmp PEEP:  [6 cmH20] 6 cmH20 Pressure Support:  [6 cmH20] 6 cmH20   Intake/Output Summary (Last 24 hours) at 04/02/2024 1134 Last data filed at 04/02/2024 0830 Gross per 24 hour  Intake 480 ml  Output 900 ml  Net -420 ml   Filed Weights   04/01/24 0610 04/01/24 2118 04/02/24 0349  Weight: 46 kg 46.3 kg 47.4 kg    Examination: General: Chronically ill-appearing 63 year old female currently on noninvasive positive pressure ventilation she is not in acute distress currently, apparently was quite anxious prior to Ativan  HENT: Normal cephalic positive JVD BiPAP mask in place able to phonate Lungs: Diffuse wheezing and rhonchi portable chest x-ray personally reviewed:Cardiomegaly, new right PICC line in place, ICD with insertion site at left subclavian, vascular congestion. Cardiovascular: Regular rate and rhythm Abdomen: Soft not tender Extremities: Warm with brisk capillary refill Neuro: Awake oriented no focal deficits GU: Due to void  Resolved Hospital Problem list     Assessment & Plan:  Acute on chronic hypoxic respiratory failure secondary to acute heart failure with pulmonary edema, further complicated by underlying COPD with acute exacerbation Plan Continuing NIPPV DNR/DNI per advanced heart failure discussion Change Solu-Medrol  to 40 daily, she does not need twice daily dosing Continue  to push diuresis, currently on IV Lasix  drip Scheduled bronchodilators Day #2 doxycycline  As needed chest x-ray No need for  arterial blood gas will not change management  Acute on chronic biventricular heart failure, with [place]  heart association class IV dyspnea, and last echocardiogram showing EF less than 20% with severe RV dysfunction Plan Started on dobutamine , titrate per advanced heart failure Continue push diuresis, currently on IV Lasix  drip Trend CVP Trend, co-ox Holding goal-directed medical therapy given hypotension Continue telemetry IV chlorothiazide  Continue Farxiga  Continue digoxin  Midodrine  to support MAP  CKD stage III with rising serum creatinine from baseline.  Improving post diuresis Plan Avoiding hypotension Maximize cardiac output Renal dose medications Strict intake output I do not think she is a good candidate for dialysis should her status deteriorate further  History of PAF Plan Continue telemetry monitoring Eliquis  at reduced weight dosing  History of coronary artery disease with CABG in 2019, and drug-eluting stent in 2020 Plan Continuing atorvastatin  80 mg daily No need for aspirin  given already on Eliquis   Hyperglycemia Plan Sliding scale insulin  Goal 140s to 180  Remote history of small cell lung cancer treated with chemo and XRT has been stable Plan Given how advanced her heart failure is no follow-up recommended as she would not be a candidate for additional therapy  History of renal infarct Plan Trend creatinine  Goals of care.  She is end-stage heart failure.  Case discussed with advanced heart failure attending and the patient.  She is now DO NOT RESUSCITATE Plan DNR/DNI   Best Practice (right click and "Reselect all SmartList Selections" daily)   Diet/type: NPO w/ oral meds DVT prophylaxis DOAC Pressure ulcer(s): N/A GI prophylaxis: N/A Lines: Central line Foley:  Yes, and it is still needed Code Status:  DNR Last date of multidisciplinary goals of care discussion [see note per HF ]  Labs   CBC: Recent Labs  Lab 04/01/24 0618  04/01/24 0619 04/01/24 0625 04/02/24 0333  WBC  --   --  13.5* 10.3  NEUTROABS  --   --  9.9*  --   HGB 13.9 14.6 13.1 10.6*  HCT 41.0 43.0 42.7 32.8*  MCV  --   --  94.9 90.6  PLT  --   --  247 185    Basic Metabolic Panel: Recent Labs  Lab 04/01/24 0618 04/01/24 0619 04/01/24 0625 04/02/24 0333  NA 139 140 138 136  K 3.8 3.7 3.3* 3.9  CL  --  107 102 103  CO2  --   --  21* 23  GLUCOSE  --  332* 314* 282*  BUN  --  35* 26* 31*  CREATININE  --  1.80* 1.71* 1.61*  CALCIUM   --   --  9.5 9.4  MG  --   --   --  2.4   GFR: Estimated Creatinine Clearance: 25.1 mL/min (A) (by C-G formula based on SCr of 1.61 mg/dL (H)). Recent Labs  Lab 04/01/24 0620 04/01/24 0625 04/01/24 1416 04/02/24 0333  WBC  --  13.5*  --  10.3  LATICACIDVEN 3.1*  --  3.0*  --     Liver Function Tests: Recent Labs  Lab 04/02/24 0333  AST 25  ALT 16  ALKPHOS 61  BILITOT 0.8  PROT 6.7  ALBUMIN  3.5   No results for input(s): "LIPASE", "AMYLASE" in the last 168 hours. No results for input(s): "AMMONIA" in the last 168 hours.  ABG    Component Value Date/Time   PHART  7.219 (L) 09/25/2023 0421   PCO2ART 73.3 (HH) 09/25/2023 0421   PO2ART 279 (H) 09/25/2023 0421   HCO3 25.2 04/01/2024 0618   TCO2 25 04/01/2024 0619   ACIDBASEDEF 1.0 04/01/2024 0618   O2SAT 90 04/01/2024 0618     Coagulation Profile: Recent Labs  Lab 04/02/24 0333  INR 1.5*    Cardiac Enzymes: No results for input(s): "CKTOTAL", "CKMB", "CKMBINDEX", "TROPONINI" in the last 168 hours.  HbA1C: Hgb A1c MFr Bld  Date/Time Value Ref Range Status  02/16/2024 12:33 PM 7.2 (H) 4.8 - 5.6 % Final    Comment:    (NOTE) Pre diabetes:          5.7%-6.4%  Diabetes:              >6.4%  Glycemic control for   <7.0% adults with diabetes   08/05/2023 05:32 PM 6.1 (H) 4.8 - 5.6 % Final    Comment:    (NOTE) Pre diabetes:          5.7%-6.4%  Diabetes:              >6.4%  Glycemic control for   <7.0% adults with  diabetes     CBG: No results for input(s): "GLUCAP" in the last 168 hours.  Review of Systems:   Not able   Past Medical History:  She,  has a past medical history of Acute respiratory failure (HCC) (05/05/2018), Acute systolic congestive heart failure (HCC) (02/03/2018), AICD (automatic cardioverter/defibrillator) present, Allergy, Anxiety, Asthma, DM2 (diabetes mellitus, type 2) (HCC) (10/20/2020), Hypertension, PAF (paroxysmal atrial fibrillation) (HCC), Presence of permanent cardiac pacemaker, Prophylactic measure (08/03/14-08/19/14), S/P emergency CABG x 3 (02/03/2018), and Small cell lung cancer (HCC) (03/16/2014).   Surgical History:   Past Surgical History:  Procedure Laterality Date   BRONCHIAL BRUSHINGS  10/25/2020   Procedure: BRONCHIAL BRUSHINGS;  Surgeon: Denson Flake, MD;  Location: [facility] ENDOSCOPY;  Service: Pulmonary;;   BRONCHIAL NEEDLE ASPIRATION BIOPSY  10/25/2020   Procedure: BRONCHIAL NEEDLE ASPIRATION BIOPSIES;  Surgeon: Denson Flake, MD;  Location: [facility] ENDOSCOPY;  Service: Pulmonary;;   CARDIAC DEFIBRILLATOR PLACEMENT  08/15/2018   MDT Visia AF MRI VR ICD implanted by Dr Dolly Fret for primary prevention of sudden   CESAREAN SECTION     CORONARY ARTERY BYPASS GRAFT N/A 02/03/2018   Procedure: CORONARY ARTERY BYPASS GRAFTING (CABG);  Surgeon: Gardenia Jump, MD;  Location: [facility] OR;  Service: Open Heart Surgery;  Laterality: N/A;  Time 3 using left internal mammary artery and endoscopically harvested right saphenous vein   CORONARY BALLOON ANGIOPLASTY N/A 02/03/2018   Procedure: CORONARY BALLOON ANGIOPLASTY;  Surgeon: Swazil[place], Ndea Kilroy M, MD;  Location: [facility] INVASIVE CV LAB;  Service: Cardiovascular;  Laterality: N/A;   CORONARY STENT INTERVENTION N/A 02/12/2019   Procedure: CORONARY STENT INTERVENTION;  Surgeon: Wenona Hamilton, MD;  Location: [facility] INVASIVE CV LAB;  Service: Cardiovascular;  Laterality: N/A;   CORONARY/GRAFT ACUTE MI REVASCULARIZATION N/A 02/03/2018   Procedure:  Coronary/Graft Acute MI Revascularization;  Surgeon: [place], Nelton Amsden M, MD;  Location: [facility] INVASIVE CV LAB;  Service: Cardiovascular;  Laterality: N/A;   ENDOBRONCHIAL ULTRASOUND N/A 10/25/2020   Procedure: ENDOBRONCHIAL ULTRASOUND;  Surgeon: Denson Flake, MD;  Location: [facility] ENDOSCOPY;  Service: Pulmonary;  Laterality: N/A;   FLEXIBLE BRONCHOSCOPY  10/25/2020   Procedure: FLEXIBLE BRONCHOSCOPY;  Surgeon: Denson Flake, MD;  Location: [facility] ENDOSCOPY;  Service: Pulmonary;;   IABP INSERTION N/A 02/03/2018   Procedure: IABP Insertion;  Surgeon: [place]azil[place], Fawn Desrocher M, MD;  Location: [facility] INVASIVE CV LAB;  Service: Cardiovascular;  Laterality: N/A;   INTRAOPERATIVE TRANSESOPHAGEAL ECHOCARDIOGRAM N/A 02/03/2018   Procedure: INTRAOPERATIVE TRANSESOPHAGEAL ECHOCARDIOGRAM;  Surgeon: Gardenia Jump, MD;  Location: [facility] OR;  Service: Open Heart Surgery;  Laterality: N/A;   LEFT HEART CATH AND CORONARY ANGIOGRAPHY N/A 02/03/2018   Procedure: LEFT HEART CATH AND CORONARY ANGIOGRAPHY;  Surgeon: [place], Gordana Kewley M, MD;  Location: [facility] INVASIVE CV LAB;  Service: Cardiovascular;  Laterality: N/A;   LEFT HEART CATH AND CORS/GRAFTS ANGIOGRAPHY N/A 02/12/2019   Procedure: LEFT HEART CATH AND CORS/GRAFTS ANGIOGRAPHY;  Surgeon: Wenona Hamilton, MD;  Location: [facility] INVASIVE CV LAB;  Service: Cardiovascular;  Laterality: N/A;   MEDIASTINOSCOPY N/A 03/11/2014   Procedure: MEDIASTINOSCOPY;  Surgeon: Zelphia Higashi, MD;  Location: [facility] OR;  Service: Thoracic;  Laterality: N/A;   PLACEMENT OF IMPELLA LEFT VENTRICULAR ASSIST DEVICE  02/03/2018   Procedure: PLACEMENT OF IMPELLA LEFT VENTRICULAR ASSIST DEVICE LD;  Surgeon: Gardenia Jump, MD;  Location: [facility] OR;  Service: Open Heart Surgery;;   REMOVAL OF IMPELLA LEFT VENTRICULAR ASSIST DEVICE N/A 02/08/2018   Procedure: REMOVAL OF IMPELLA LEFT VENTRICULAR ASSIST DEVICE;  Surgeon: Gardenia Jump, MD;  Location: [facility] OR;  Service: Open Heart Surgery;  Laterality: N/A;   RIGHT HEART CATH N/A  02/03/2018   Procedure: RIGHT HEART CATH;  Surgeon: [place], Aanvi Voyles M, MD;  Location: [facility] INVASIVE CV LAB;  Service: Cardiovascular;  Laterality: N/A;   RIGHT HEART CATH N/A 05/09/2018   Procedure: RIGHT HEART CATH;  Surgeon: Mardell Shade, MD;  Location: [facility] INVASIVE CV LAB;  Service: Cardiovascular;  Laterality: N/A;   TEE WITHOUT CARDIOVERSION N/A 02/08/2018   Procedure: TRANSESOPHAGEAL ECHOCARDIOGRAM (TEE);  Surgeon: Gardenia Jump, MD;  Location: [facility] OR;  Service: Open Heart Surgery;  Laterality: N/A;   TUBAL LIGATION     VIDEO BRONCHOSCOPY WITH ENDOBRONCHIAL ULTRASOUND N/A 03/11/2014   Procedure: VIDEO BRONCHOSCOPY WITH ENDOBRONCHIAL ULTRASOUND;  Surgeon: Zelphia Higashi, MD;  Location: [facility] OR;  Service: Thoracic;  Laterality: N/A;     Social History:   reports that she quit smoking about 10 years ago. Her smoking use included cigarettes. She started smoking about 30 years ago. She has a 20 pack-year smoking history. She has never used smokeless tobacco. She reports current alcohol use of about 5.0 standard drinks of alcohol per week. She reports current drug use. Drug: Marijuana.   Family History:  Her family history includes Cancer in her maternal grandmother; Diabetes in her paternal grandmother; Heart attack in her mother; Heart disease in her mother; Hypertension in her maternal grandmother and mother. There is no history of Stroke.   Allergies Allergies  Allergen Reactions   Lactose Intolerance (Gi) Diarrhea   Codeine Nausea And Vomiting     Home Medications  Prior to Admission medications   Medication Sig Start Date End Date Taking? Authorizing Provider  albuterol  (VENTOLIN  HFA) 108 (90 Base) MCG/ACT inhaler Inhale 2 puffs into the lungs every 6 (six) hours as needed for wheezing or shortness of breath. 06/20/22  Yes Deforest Fast, MD  atorvastatin  (LIPITOR ) 80 MG tablet Take 1 tablet (80 mg total) by mouth daily. 08/06/23 11/20/24 Yes Santa Cuba, MD  cyclobenzaprine   (FLEXERIL ) 10 MG tablet Take 1 tablet (10 mg total) by mouth 3 (three) times daily as needed for muscle spasms. Patient taking differently: Take 0.5 mg by mouth daily as needed for muscle spasms. 10/11/23  Yes Abraham Abo, MD  digoxin  (LANOXIN ) 0.125 MG tablet  TAKE 1 TABLET BY MOUTH DAILY Patient taking differently: Take 125 mcg by mouth at bedtime. 04/23/23  Yes [name], Arlice Bene, FNP  ELIQUIS  2.5 MG TABS tablet TAKE 1 TABLET BY MOUTH TWICE A DAY 12/07/23  Yes Bensimhon, Rheta Celestine, MD  FARXIGA  10 MG TABS tablet TAKE 1 TABLET BY MOUTH EVERY DAY 11/21/23  Yes Bensimhon, Rheta Celestine, MD  Fluticasone -Umeclidin-Vilant (TRELEGY ELLIPTA ) 200-62.5-25 MCG/ACT AEPB Inhale 1 puff into the lungs daily. 02/27/24  Yes Abraham Abo, MD  hydrOXYzine  (VISTARIL ) 25 MG capsule TAKE 1 CAPSULE (25 MG TOTAL) BY MOUTH DAILY AS NEEDED. Patient taking differently: Take 25 mg by mouth daily as needed for anxiety. 07/30/23  Yes Rogerio Clay, Amy J, NP  ipratropium-albuterol  (DUONEB) 0.5-2.5 (3) MG/3ML SOLN TAKE 3 MLS BY NEBULIZATION EVERY 4 (FOUR) HOURS AS NEEDED (FOR SHORTNESS OF BREATH). 02/22/24  Yes Abraham Abo, MD  ivabradine  (CORLANOR ) 7.5 MG TABS tablet Take 1 tablet (7.5 mg total) by mouth 2 (two) times daily with a meal. 02/26/24  Yes Bensimhon, Rheta Celestine, MD  megestrol  (MEGACE ) 20 MG tablet Take 1 tablet (20 mg total) by mouth 2 (two) times daily. 02/26/24  Yes Abraham Abo, MD  metolazone  (ZAROXOLYN ) 2.5 MG tablet Take 1 tablet (2.5 mg total) by mouth once a week. on Tuesday starting 01/15/2024 with 40 mEq of Potassium 3 times per day on metolazone  days 01/14/24  Yes [name], Arlice Bene, FNP  Multiple Vitamin (MULTIVITAMIN WITH MINERALS) TABS tablet Take 1 tablet by mouth daily.   Yes [provider]  nitroGLYCERIN  (NITROSTAT ) 0.4 MG SL tablet Place 1 tablet (0.4 mg total) under the tongue every 5 (five) minutes as needed for chest pain. 03/13/24  Yes Lee, [place], NP  Nutritional Supplements (ENSURE ORIGINAL) LIQD  Take 1 Bottle by mouth 2 (two) times daily after a meal. Drink 1 bottle 2 times daily.   Yes [provider]  potassium chloride  (KLOR-CON ) 20 MEQ packet Take 40 mEq by mouth daily.   Dissolve two ( ) packets in 4-6oz of fluid three times a day when taking Metolazone  2.5mg .   Yes [provider]  spironolactone  (ALDACTONE ) 25 MG tablet Take 1 tablet (25 mg total) by mouth at bedtime. Patient taking differently: Take 12.5 mg by mouth at bedtime. 12/22/23 04/02/24 Yes Ghimire, Estil Heman, MD  torsemide  (DEMADEX ) 20 MG tablet Take 5 tablets (100 mg total) by mouth 2 (two) times daily. 03/04/24  Yes [name], Arlice Bene, FNP  midodrine  (PROAMATINE ) 5 MG tablet Take 1 tablet (5 mg total) by mouth 3 (three) times daily with meals. 12/22/23   Ghimire, Estil Heman, MD     Critical care time: 34 min

## 2024-04-02 NOTE — Progress Notes (Signed)
 Patient called for assistance, complain of difficulty breathing, placed back on BiPAP immediately. Continued to monitor.

## 2024-04-02 NOTE — Progress Notes (Signed)
 Advanced Heart Failure Progress Update  Called to bedside by RN for acute worsening respiratory distress. She had been started on DBA 2.5 mcg/kg/min and given IV Lasix  160 mg without urine response. Rhonchi and coarse crackles heard audibly over Bipap. On exam, patient is in distress and anxious with RR in 40s and HR in 140s. FiO2 on BiPAP increased. Given 0.5 mg IV ativan . On prior rounding this morning, patient expressed to myself and Dr. Bensimhon that she would want intubation if needed. Reconfirmed this at bedside. Given this decision was made to move her to the ICU for possible intubation and further management.   On arrival to ICU, Dr. Alease Amend again discussed intubation, at this point patient stated that she would not want to be intubated.   Plan: - continue BiPAP support - increase DBA to 5 mcg/kg/min - obtain coox; CVP - due to little response to IV bolus dosing, start Lasix  gtt 20 mg/hr  Brittany Selma Mink, NP 04/02/24  Advanced Heart Failure Team Pager [phone] (M-F; 7a - 5p)  Please contact [facility] for night-coverage after hours (4p -7a ) and weekends on [email or web address]

## 2024-04-02 NOTE — Progress Notes (Signed)
 OT Cancellation Note  Patient Details Name: Brittany [name]  MRN: [id number] DOB: August 07, 1961   Cancelled Treatment:    Reason Eval/Treat Not Completed: Medical issues which prohibited therapy (RN requesting to hold at this time 2/2 increased respiratory distress  OT will continue to follow)  Lacretia Piccolo 04/02/2024, 12:15 PM

## 2024-04-02 NOTE — Progress Notes (Signed)
 PT Cancellation Note  Patient Details Name: Brittany [name]  MRN: [id number] DOB: [birth year]   Cancelled Treatment:    Reason Eval/Treat Not Completed: Medical issues which prohibited therapy. Pt required transfer to 2H this morning due to respiratory distress and need for BIPAP. PT will follow up when the pt is more medically stable and able to better participate in PT evaluation.   Rexie Catena 04/02/2024, 10:43 AM

## 2024-04-02 NOTE — Progress Notes (Signed)
 PROGRESS NOTE  Brittany [name]  ZOX:[id number] DOB: 1961/03/09 DOA: 04/01/2024 PCP: Abraham Abo, MD   LOS: 1 day   Brief narrative:  Brittany [name]  is a 63 y.o. 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  History of admission to the hospital twice in 2023 with low output requiring milrinone . Multiple CHF and AECOPD admission in May, August, October of 2024 and Janurary 2025.  In the ED, CBC showed leukocytosis at 13.5.  BNP was more than 4000.  Troponin of 64.  Potassium was low at 3.3.  Creatinine was 1.7.  Chest x-ray showed vascular congestion.  Lactate was elevated at 3.1. EKG did not show any acute ischemic changes.  Patient received 60 mEq of IV Lasix  and received oral potassium as well.    Assessment/Plan: Principal Problem:   Acute respiratory failure with hypoxia (HCC) Active Problems:   COPD with acute exacerbation (HCC)   History of CAD (coronary artery disease)   Acute on chronic systolic CHF (congestive heart failure) (HCC)   Troponin level elevated   CKD stage 3b, GFR 30-44 ml/min (HCC)   Essential hypertension   Type 2 diabetes mellitus with hyperlipidemia (HCC)   History of lung cancer  Acute respiratory distress secondary to acute on chronic systolic heart failure exacerbation. End disease heart failure Patient with recurrent admissions for the same.  Status post AICD and Barostim in the past.  Last 2D echocardiogram from 12/15/2023 with LV ejection fraction of less than 20% with global hypokinesis and grade 2 diastolic dysfunction.  Seen by cardiology as outpatient. Patient  was not considered to be not a candidate for advanced therapies due to severity of lung disease.   Digoxin  level less than 3.2.  At this time heart failure team has been consulted for further evaluation and treatment.  Patient has been transferred to ICU service.  Has been started on Farxiga , digoxin , spironolactone , midodrine , dobutamine  and Lasix  infusion.  Follow cardiology recommendation.  Chronic hypoxic respiratory failure secondary to COPD. Continue bronchodilators.  Required BiPAP in the ED.  Continue IV Solu-Medrol , still in respiratory distress on BiPAP.  Patient is DO NOT RESUSCITATE DO NOT INTUBATE.  Elevated troponin likely secondary to demand ischemia.  Complains of mild chest discomfort.  Hypokalemia.  Received potassium supplements.  Potassium today at 3.9.   CKD Stage IIIb  Creatinine baseline ~ 1.4-1.6.  Creatinine today at 1.6..  Continue to monitor closely while on IV diuretics.  Paroxysmal atrial fibrillation. On Eliquis  at home.  Will continue.   CAD with history of CABG 2019. Continue Lipitor .   H/o SCLC - Completed treatment 2015.    Failure to thrive, recurrent hospitalization. Patient is DNR/DNI.  Palliative care been consulted for goals of care discussion. Overall prognosis of the patient is poor.  DVT prophylaxis: apixaban  (ELIQUIS ) tablet 2.5 mg Start: 04/01/24 1100 SCDs Start: 04/01/24 0937 apixaban  (ELIQUIS ) tablet 2.5 mg   Disposition: Uncertain at this time  Status is: Inpatient Remains inpatient appropriate because: Acute respiratory distress on BiPAP, advanced heart failure, pending clinical improvement,    Code Status:     Code Status: Limited: Do not attempt resuscitation (DNR) -DNR-LIMITED -Do  Not Intubate/DNI   Family Communication: None at bedside  Consultants: PCCM Advanced heart failure team  Procedures: Right upper extremity PICC line on 04/02/2024 BiPAP  Anti-infectives:  Doxycycline  p.o. twice daily  Anti-infectives (From  admission, onward)    Start     Dose/Rate Route Frequency Ordered Stop   04/01/24 1100  doxycycline  (VIBRA -TABS) tablet 100 mg        100 mg Oral Every 12 hours 04/01/24 1047         Subjective: Today, patient was seen and examined at bedside.  Plaints of respiratory distress shortness of breath chest discomfort on BiPAP.  Has difficulty breathing with shortness of breath and feels anxious.  Denies any nausea vomiting abdominal pain fever chills or rigors.  Has been transferred to ICU for vasopressor support for diuresis..  Objective: Vitals:   04/02/24 1045 04/02/24 1100  BP: (!) 140/64 (!) 152/75  Pulse: (!) 120 (!) 128  Resp: (!) 43 (!) 24  Temp:  (!) 96.2 F (35.7 C)  SpO2: 100% 100%    Intake/Output Summary (Last 24 hours) at 04/02/2024 1230 Last data filed at 04/02/2024 1100 Gross per 24 hour  Intake 549.08 ml  Output 900 ml  Net -350.92 ml   Filed Weights   04/01/24 0610 04/01/24 2118 04/02/24 0349  Weight: 46 kg 46.3 kg 47.4 kg   Body mass index is 20.75 kg/m.   Physical Exam:  GENERAL: Patient is alert awake and oriented.  Communicative.  In mild respiratory distress on BiPAP, patient is chronically ill and deconditioned. HENT: No scleral pallor or icterus. Pupils equally reactive to light. Oral mucosa is moist NECK: is supple, no gross swelling noted.  Distended neck veins. CHEST:   Diminished breath sounds bilaterally.  Coarse breath sounds noted CVS: S1 and S2 heard, no murmur. Regular rate and rhythm.  ABDOMEN: Soft, non-tender, bowel sounds are present. EXTREMITIES: No edema.  Right upper extremity PICC line in place CNS: Cranial nerves are intact. No focal motor deficits.  Anxious appearing SKIN: warm and dry without rashes.  Data Review: I have personally reviewed the following laboratory data and studies,  CBC: Recent Labs  Lab 04/01/24 0618 04/01/24 0619 04/01/24 0625 04/02/24 0333  WBC  --   --  13.5* 10.3  NEUTROABS  --   --  9.9*  --    HGB 13.9 14.6 13.1 10.6*  HCT 41.0 43.0 42.7 32.8*  MCV  --   --  94.9 90.6  PLT  --   --  247 185   Basic Metabolic Panel: Recent Labs  Lab 04/01/24 0618 04/01/24 0619 04/01/24 0625 04/02/24 0333  NA 139 140 138 136  K 3.8 3.7 3.3* 3.9  CL  --  107 102 103  CO2  --   --  21* 23  GLUCOSE  --  332* 314* 282*  BUN  --  35* 26* 31*  CREATININE  --  1.80* 1.71* 1.61*  CALCIUM   --   --  9.5 9.4  MG  --   --   --  2.4   Liver Function Tests: Recent Labs  Lab 04/02/24 0333  AST 25  ALT 16  ALKPHOS 61  BILITOT 0.8  PROT 6.7  ALBUMIN  3.5   No results for input(s): "LIPASE", "AMYLASE" in the last 168 hours. No results for input(s): "AMMONIA" in the last 168 hours. Cardiac Enzymes: No results for input(s): "CKTOTAL", "CKMB", "CKMBINDEX", "TROPONINI" in the last 168 hours. BNP (last 3 results) Recent Labs  02/14/24 2132 03/13/24 1127 04/01/24 0625  BNP 2,541.1* 1,892.9* 4,049.6*    ProBNP (last 3 results) No results for input(s): "PROBNP" in the last 8760 hours.  CBG: No results for input(s): "GLUCAP" in the last 168 hours. Recent Results (from the past 240 hours)  Resp panel by RT-PCR (RSV, Flu A&B, Covid) Anterior Nasal Swab     Status: None   Collection Time: 04/01/24  6:23 AM   Specimen: Anterior Nasal Swab  Result Value Ref Range Status   SARS Coronavirus 2 by RT PCR NEGATIVE NEGATIVE Final   Influenza A by PCR NEGATIVE NEGATIVE Final   Influenza B by PCR NEGATIVE NEGATIVE Final    Comment: (NOTE) The Xpert Xpress SARS-CoV-2/FLU/RSV plus assay is intended as an aid in the diagnosis of influenza from Nasopharyngeal swab specimens and should not be used as a sole basis for treatment. Nasal washings and aspirates are unacceptable for Xpert Xpress SARS-CoV-2/FLU/RSV testing.  Fact Sheet for Patients: BloggerCourse.com  Fact Sheet for Healthcare Providers: SeriousBroker.it  This test is not yet approved  or cleared by the [place]  FDA and has been authorized for detection and/or diagnosis of SARS-CoV-2 by FDA under an Emergency Use Authorization (EUA). This EUA will remain in effect (meaning this test can be used) for the duration of the COVID-19 declaration under Section 564(b)(1) of the Act, 21 U.S.C. section 360bbb-3(b)(1), unless the authorization is terminated or revoked.     Resp Syncytial Virus by PCR NEGATIVE NEGATIVE Final    Comment: (NOTE) Fact Sheet for Patients: BloggerCourse.com  Fact Sheet for Healthcare Providers: SeriousBroker.it  This test is not yet approved or cleared by the [place]  FDA and has been authorized for detection and/or diagnosis of SARS-CoV-2 by FDA under an Emergency Use Authorization (EUA). This EUA will remain in effect (meaning this test can be used) for the duration of the COVID-19 declaration under Section 564(b)(1) of the Act, 21 U.S.C. section 360bbb-3(b)(1), unless the authorization is terminated or revoked.  Performed at [facility], [address], [place], [place] [address]      Studies: DG CHEST PORT 1 VIEW Result Date: 04/02/2024 CLINICAL DATA:  PICC line placement EXAM: PORTABLE CHEST 1 VIEW COMPARISON:  April 01, 2024 FINDINGS: No change in the left subclavian bipolar ICDF pacemaker device tip of the leads in good position no evidence of discontinuity Stimulator device traveling through the right hemithorax into the right lower neck. Right PICC line tip in the SVC right atrium junction. Ill-defined basilar pulmonary infiltrates appear slightly improved since prior examination. No consolidations. COPD changes without significant pleural effusions Heart and mediastinum normal. Prior CABG. IMPRESSION: *Right PICC line tip in the SVC right atrium junction. *Ill-defined basilar pulmonary infiltrates appear slightly improved since prior examination. Electronically Signed   By:  Fredrich Jefferson M.D.   On: 04/02/2024 11:36   US  EKG SITE RITE Result Date: 04/02/2024 If Site Rite image not attached, placement could not be confirmed due to current cardiac rhythm.  DG Chest Portable 1 View Result Date: 04/01/2024 CLINICAL DATA:  Shortness of breath. EXAM: PORTABLE CHEST 1 VIEW COMPARISON:  Radiograph 02/16/2024, CT 02/15/2019 FINDINGS: Prior median sternotomy. Left-sided pacemaker and right neurostimulator in place. Stable heart size and mediastinal contours. Chronic hyperinflation and bronchial thickening. Increased interstitial opacities in the left lung suspicious for asymmetric edema. Blunting of the costophrenic angles is likely due to hyperinflation rather than pleural effusions and compared with prior CT. No pneumothorax. IMPRESSION: 1. Increased interstitial opacities in the  left lung suspicious for asymmetric edema. 2. Chronic hyperinflation and bronchial thickening. Electronically Signed   By: Chadwick Colonel M.D.   On: 04/01/2024 10:01      Rosena Conradi, MD  [facility] 04/02/2024  If 7PM-7AM, please contact night-coverage

## 2024-04-02 NOTE — Progress Notes (Addendum)
 1035: Report received after arrival to unit from Natalie W. RN. Pt stable on bipap, no new interventions needed at this time. PICC team to bedside for previously ordered PICC line. Pt A&O X 4, following commands, VSS.   1540: Pt requesting BiPap be removed, Dr. Alease Amend made aware. Dr. Alease Amend would like Bipap to stay on for now. Pt stating that she feels better and would like to speak with Dr. Alease Amend. Dr. Alease Amend made aware.

## 2024-04-02 NOTE — Progress Notes (Signed)
 Dicussed with advanced heart failure team. Patient needing ICU level of care. PCCM has taken over the care. Will sign off.

## 2024-04-02 NOTE — Progress Notes (Signed)
 Peripherally Inserted Central Catheter Placement  The IV Nurse has discussed with the patient and/or persons authorized to consent for the patient, the purpose of this procedure and the potential benefits and risks involved with this procedure.  The benefits include less needle sticks, lab draws from the catheter, and the patient may be discharged home with the catheter. Risks include, but not limited to, infection, bleeding, blood clot (thrombus formation), and puncture of an artery; nerve damage and irregular heartbeat and possibility to perform a PICC exchange if needed/ordered by physician.  Alternatives to this procedure were also discussed.  Bard Power PICC patient education guide, fact sheet on infection prevention and patient information card has been provided to patient /or left at bedside.    PICC Placement Documentation  PICC Double Lumen 04/02/24 Right Cephalic 36 cm 0 cm (Active)  Indication for Insertion or Continuance of Line Vasoactive infusions 04/02/24 1106  Exposed Catheter (cm) 0 cm 04/02/24 1106  Site Assessment Clean, Dry, Intact 04/02/24 1106  Lumen #1 Status Flushed;Blood return noted;Saline locked 04/02/24 1106  Lumen #2 Status Flushed;Saline locked;Blood return noted 04/02/24 1106  Dressing Type Transparent;Securing device 04/02/24 1106  Dressing Status Antimicrobial disc/dressing in place;Clean, Dry, Intact 04/02/24 1106  Line Adjustment (NICU/IV Team Only) No 04/02/24 1106  Dressing Intervention Adhesive placed at insertion site (IV team only) 04/02/24 1106  Dressing Change Due 04/09/24 04/02/24 1106       Daphney Eans 04/02/2024, 11:11 AM

## 2024-04-02 NOTE — Progress Notes (Signed)
 During an emergency this morning, ativan  was overrided on the pyxis per the NP [place] request to administer 0.5 mg/0.25ml of ativan . After the patient was transferred to [facility], this RN was not able to waste on [address] Pyxis-B due to the patient being transferred. Pharmacy was called and made aware and instructed to this RN to write a progress note incase a discrepancy was created/unresolved. Nurse Manager on [address] [place], [place] witnessed the waste of  0.75 ml of Ativan  @ 1730  Keane Passe, [place] 04/02/2024 5:42 PM

## 2024-04-02 NOTE — Progress Notes (Signed)
 Pt taken off bipap and placed on HFNC 14 LPM per pt request

## 2024-04-03 ENCOUNTER — Telehealth: Payer: Self-pay

## 2024-04-03 DIAGNOSIS — Z7189 Other specified counseling: Secondary | ICD-10-CM

## 2024-04-03 DIAGNOSIS — I5043 Acute on chronic combined systolic (congestive) and diastolic (congestive) heart failure: Secondary | ICD-10-CM | POA: Diagnosis not present

## 2024-04-03 DIAGNOSIS — J9621 Acute and chronic respiratory failure with hypoxia: Secondary | ICD-10-CM | POA: Diagnosis not present

## 2024-04-03 DIAGNOSIS — Z515 Encounter for palliative care: Secondary | ICD-10-CM

## 2024-04-03 DIAGNOSIS — J441 Chronic obstructive pulmonary disease with (acute) exacerbation: Secondary | ICD-10-CM | POA: Diagnosis not present

## 2024-04-03 DIAGNOSIS — I5023 Acute on chronic systolic (congestive) heart failure: Secondary | ICD-10-CM | POA: Diagnosis not present

## 2024-04-03 DIAGNOSIS — J9601 Acute respiratory failure with hypoxia: Secondary | ICD-10-CM | POA: Diagnosis not present

## 2024-04-03 DIAGNOSIS — J810 Acute pulmonary edema: Secondary | ICD-10-CM | POA: Diagnosis not present

## 2024-04-03 DIAGNOSIS — N1832 Chronic kidney disease, stage 3b: Secondary | ICD-10-CM | POA: Diagnosis not present

## 2024-04-03 LAB — BASIC METABOLIC PANEL WITH GFR
Anion gap: 14 (ref 5–15)
Anion gap: 16 — ABNORMAL HIGH (ref 5–15)
BUN: 39 mg/dL — ABNORMAL HIGH (ref 8–23)
BUN: 43 mg/dL — ABNORMAL HIGH (ref 8–23)
CO2: 28 mmol/L (ref 22–32)
CO2: 29 mmol/L (ref 22–32)
Calcium: 9.7 mg/dL (ref 8.9–10.3)
Calcium: 11 mg/dL — ABNORMAL HIGH (ref 8.9–10.3)
Chloride: 96 mmol/L — ABNORMAL LOW (ref 98–111)
Chloride: 94 mmol/L — ABNORMAL LOW (ref 98–111)
Creatinine, Ser: 1.61 mg/dL — ABNORMAL HIGH (ref 0.44–1.00)
Creatinine, Ser: 1.49 mg/dL — ABNORMAL HIGH (ref 0.44–1.00)
GFR, Estimated: 36 mL/min — ABNORMAL LOW (ref >60)
GFR, Estimated: 39 mL/min — ABNORMAL LOW (ref >60)
Glucose, Bld: 152 mg/dL — ABNORMAL HIGH (ref 70–99)
Glucose, Bld: 74 mg/dL (ref 70–99)
Potassium: 3.4 mmol/L — ABNORMAL LOW (ref 3.5–5.1)
Potassium: 4.5 mmol/L (ref 3.5–5.1)
Sodium: 138 mmol/L (ref 135–145)
Sodium: 139 mmol/L (ref 135–145)

## 2024-04-03 LAB — GLUCOSE, CAPILLARY
Glucose-Capillary: 80 mg/dL (ref 70–99)
Glucose-Capillary: 132 mg/dL — ABNORMAL HIGH (ref 70–99)
Glucose-Capillary: 306 mg/dL — ABNORMAL HIGH (ref 70–99)
Glucose-Capillary: 268 mg/dL — ABNORMAL HIGH (ref 70–99)
Glucose-Capillary: 238 mg/dL — ABNORMAL HIGH (ref 70–99)

## 2024-04-03 LAB — CBC
HCT: 39.9 % (ref 36.0–46.0)
Hemoglobin: 12.8 g/dL (ref 12.0–15.0)
MCH: 29.2 pg (ref 26.0–34.0)
MCHC: 32.1 g/dL (ref 30.0–36.0)
MCV: 91.1 fL (ref 80.0–100.0)
Platelets: 219 10*3/uL (ref 150–400)
RBC: 4.38 MIL/uL (ref 3.87–5.11)
RDW: 18.3 % — ABNORMAL HIGH (ref 11.5–15.5)
WBC: 11.3 10*3/uL — ABNORMAL HIGH (ref 4.0–10.5)
nRBC: 0 % (ref 0.0–0.2)

## 2024-04-03 LAB — COOXEMETRY PANEL
Carboxyhemoglobin: 1.7 % — ABNORMAL HIGH (ref 0.5–1.5)
Methemoglobin: 0.7 % (ref 0.0–1.5)
O2 Saturation: 68.9 %
Total hemoglobin: 11.8 g/dL — ABNORMAL LOW (ref 12.0–16.0)

## 2024-04-03 LAB — CG4 I-STAT (LACTIC ACID): Lactic Acid, Venous: 1.7 mmol/L (ref 0.5–1.9)

## 2024-04-03 MED ORDER — FLUTICASONE PROPIONATE 50 MCG/ACT NA SUSP
1.0000 | Freq: Every day | NASAL | Status: DC
  Administered 2024-04-03 – 2024-04-07 (×5): 1 via NASAL
  Filled 2024-04-03 (×2): qty 16

## 2024-04-03 MED ORDER — METOLAZONE 5 MG PO TABS
5.0000 mg | ORAL_TABLET | Freq: Once | ORAL | Status: AC
  Administered 2024-04-03: 5 mg via ORAL
  Filled 2024-04-03: qty 1

## 2024-04-03 MED ORDER — POTASSIUM CHLORIDE 20 MEQ PO PACK
40.0000 meq | PACK | Freq: Once | ORAL | Status: AC
  Administered 2024-04-03: 40 meq
  Filled 2024-04-03: qty 2

## 2024-04-03 MED ORDER — POTASSIUM CHLORIDE CRYS ER 20 MEQ PO TBCR
40.0000 meq | EXTENDED_RELEASE_TABLET | ORAL | Status: DC
Start: 2024-04-03 — End: 2024-04-03
  Filled 2024-04-03: qty 2

## 2024-04-03 MED ORDER — PREDNISONE 20 MG PO TABS
40.0000 mg | ORAL_TABLET | Freq: Every day | ORAL | Status: DC
  Administered 2024-04-03 – 2024-04-04 (×2): 40 mg via ORAL
  Filled 2024-04-03 (×2): qty 2

## 2024-04-03 NOTE — Progress Notes (Addendum)
 Advanced Heart Failure Rounding Note  Cardiologist: Dr. Julane Ny  Chief Complaint: Acute on chronic systolic CHF/cardiogenic shock  Subjective:    Much more comfortable this am. Able to come off BiPAP.  Remains on DBA at 5 mcg/kg/min.  CVP 8. Good diuresis with lasix  gtt at 20/hr. AM labs pending.  Objective:    Weight Range: 47.4 kg Body mass index is 20.75 kg/m.   Vital Signs:   Temp:  [96.2 F (35.7 C)-99 F (37.2 C)] 99 F (37.2 C) (04/24 0353) Pulse Rate:  [97-140] 109 (04/24 0600) Resp:  [14-43] 14 (04/24 0600) BP: (102-163)/(54-134) 104/56 (04/24 0500) SpO2:  [96 %-100 %] 100 % (04/24 0600) FiO2 (%):  [50 %] 50 % (04/23 1603) Last BM Date : 03/31/24  Weight change: Filed Weights   04/01/24 0610 04/01/24 2118 04/02/24 0349  Weight: 46 kg 46.3 kg 47.4 kg   Intake/Output:  Intake/Output Summary (Last 24 hours) at 04/03/2024 0659 Last data filed at 04/03/2024 0600 Gross per 24 hour  Intake 1674.29 ml  Output 2100 ml  Net -425.71 ml    Physical Exam   General:  Fail, chronically ill appearing Neck: JVP 10-12 Cor: Regular rate & rhythm. No rubs, gallops or murmurs. Lungs: + rhonchi Abdomen: soft, nontender, nondistended.  Extremities: no edema Neuro: alert & orientedx3. Affect pleasant   Telemetry   ST 110s, PVCs  EKG    No new ECG to review  Labs    CBC Recent Labs    04/01/24 0625 04/02/24 0333  WBC 13.5* 10.3  NEUTROABS 9.9*  --   HGB 13.1 10.6*  HCT 42.7 32.8*  MCV 94.9 90.6  PLT 247 185   Basic Metabolic Panel Recent Labs    16/10/96 0625 04/02/24 0333  NA 138 136  K 3.3* 3.9  CL 102 103  CO2 21* 23  GLUCOSE 314* 282*  BUN 26* 31*  CREATININE 1.71* 1.61*  CALCIUM  9.5 9.4  MG  --  2.4   BNP: BNP (last 3 results) Recent Labs    02/14/24 2132 03/13/24 1127 04/01/24 0625  BNP 2,541.1* 1,892.9* 4,049.6*   Imaging   ECHOCARDIOGRAM COMPLETE Result Date: 04/02/2024    ECHOCARDIOGRAM REPORT   Patient Name:    Brittany [name]  Date of Exam: 04/02/2024 Medical Rec #:  [id number]               Height:       59.5 in Accession #:    [id number]              Weight:       104.5 lb Date of Birth:  1961/03/13               BSA:          1.408 m Patient Age:    63 years                BP:           152/75 mmHg Patient Gender: F                       HR:           127 bpm. Exam Location:  Inpatient Procedure: 2D Echo, Cardiac Doppler, Color Doppler, 3D Echo and Strain Analysis            (Both Spectral and Color Flow Doppler were utilized during            procedure).  Indications:    CHF  History:        Patient has prior history of Echocardiogram examinations, most                 recent 12/15/2023. CHF and HFrEF, CAD and Previous Myocardial                 Infarction, CKD3a and COPD; Risk Factors:Diabetes and Former                 Smoker.  Sonographer:    Adelia Homestead RVT Referring Phys: [id number] [facility] POKHREL  Sonographer Comments: Global longitudinal strain was attempted. IMPRESSIONS  1. Left ventricular ejection fraction, by estimation, is <20%. The left ventricle has severely decreased function. The left ventricle demonstrates regional wall motion abnormalities (see scoring diagram/findings for description). The left ventricular internal cavity size was severely dilated. Left ventricular diastolic parameters are consistent with Grade II diastolic dysfunction (pseudonormalization). Severe global hypokinesis. There is akinesis of the left ventricular, entire apical segment. There is akinesis of the left ventricular, mid-apical septal wall, inferior wall, lateral wall, anterior wall and inferolateral wall. There is akinesis of the left ventricular, basal-mid anteroseptal wall. The average left ventricular global longitudinal strain is -2.8 %. The global longitudinal strain is abnormal.  2. Right ventricular systolic function is normal. The right ventricular size is normal. Tricuspid regurgitation signal is inadequate for  assessing PA pressure.  3. Left atrial size was mildly dilated.  4. The mitral valve is normal in structure. No evidence of mitral valve regurgitation. No evidence of mitral stenosis.  5. The aortic valve is tricuspid. There is moderate calcification of the aortic valve. There is moderate thickening of the aortic valve. Aortic valve regurgitation is severe. Aortic valve sclerosis/calcification is present, without any evidence of aortic stenosis. Aortic valve mean gradient measures 8.0 mmHg. Aortic valve Vmax measures 1.99 m/s.  6. The inferior vena cava is normal in size with <50% respiratory variability, suggesting right atrial pressure of 8 mmHg. FINDINGS  Left Ventricle: Left ventricular ejection fraction, by estimation, is <20%. The left ventricle has severely decreased function. The left ventricle demonstrates regional wall motion abnormalities. The average left ventricular global longitudinal strain is -2.8 %. Strain was performed and the global longitudinal strain is abnormal. 3D ejection fraction reviewed and evaluated as part of the interpretation. Alternate measurement of EF is felt to be most reflective of LV function. The left ventricular internal cavity size was severely dilated. There is no left ventricular hypertrophy. Left ventricular diastolic parameters are consistent with Grade II diastolic dysfunction (pseudonormalization). Normal left ventricular filling pressure. Right Ventricle: The right ventricular size is normal. No increase in right ventricular wall thickness. Right ventricular systolic function is normal. Tricuspid regurgitation signal is inadequate for assessing PA pressure. Left Atrium: Left atrial size was mildly dilated. Right Atrium: Right atrial size was normal in size. Pericardium: There is no evidence of pericardial effusion. Mitral Valve: The mitral valve is normal in structure. No evidence of mitral valve regurgitation. No evidence of mitral valve stenosis. Tricuspid Valve: The  tricuspid valve is normal in structure. Tricuspid valve regurgitation is not demonstrated. No evidence of tricuspid stenosis. Aortic Valve: The aortic valve is tricuspid. There is moderate calcification of the aortic valve. There is moderate thickening of the aortic valve. Aortic valve regurgitation is severe. Aortic valve sclerosis/calcification is present, without any evidence of aortic stenosis. Aortic valve mean gradient measures 8.0 mmHg. Aortic valve peak gradient measures 15.8 mmHg. Pulmonic Valve: The  pulmonic valve was not well visualized. Pulmonic valve regurgitation is not visualized. No evidence of pulmonic stenosis. Aorta: The aortic root is normal in size and structure. Venous: The inferior vena cava is normal in size with less than 50% respiratory variability, suggesting right atrial pressure of 8 mmHg. IAS/Shunts: No atrial level shunt detected by color flow Doppler. Additional Comments: 3D was performed not requiring image post processing on an independent workstation and was indeterminate.  LEFT VENTRICLE PLAX 2D LVIDd:         5.90 cm   Diastology LVIDs:         5.30 cm   LV e' medial:    7.07 cm/s LV PW:         1.10 cm   LV E/e' medial:  12.1 LV IVS:        0.60 cm   LV e' lateral:   15.80 cm/s LVOT diam:     1.80 cm   LV E/e' lateral: 5.4 LVOT Area:     2.54 cm                          2D Longitudinal Strain                          2D Strain GLS (A4C):   -4.1 %                          2D Strain GLS (A3C):   0.0 %                          2D Strain GLS (A2C):   -4.4 %                          2D Strain GLS Avg:     -2.8 %                           3D Volume EF:                          3D EF:        34 %                          LV EDV:       222 ml                          LV ESV:       146 ml                          LV SV:        76 ml IVC IVC diam: 1.90 cm LEFT ATRIUM             Index        RIGHT ATRIUM          Index LA diam:        4.40 cm 3.13 cm/m   RA Area:     6.52 cm LA Vol  (A2C):   49.3 ml 35.02 ml/m  RA Volume:   10.20 ml 7.25 ml/m LA Vol (A4C):   46.2 ml 32.82 ml/m  LA Biplane Vol: 54.9 ml 39.00 ml/m  AORTIC VALVE               PULMONIC VALVE AV Vmax:      199.00 cm/s  PV Vmax:       1.06 m/s AV Vmean:     130.000 cm/s PV Peak grad:  4.5 mmHg AV VTI:       0.245 m AV Peak Grad: 15.8 mmHg AV Mean Grad: 8.0 mmHg  AORTA Ao Root diam: 2.60 cm MITRAL VALVE MV Area (PHT): 7.70 cm    SHUNTS MV Decel Time: 99 msec     Systemic Diam: 1.80 cm MV E velocity: 85.40 cm/s MV A velocity: 58.30 cm/s MV E/A ratio:  1.46 Gaylyn Keas MD Electronically signed by Gaylyn Keas MD Signature Date/Time: 04/02/2024/1:25:36 PM    Final    DG CHEST PORT 1 VIEW Result Date: 04/02/2024 CLINICAL DATA:  PICC line placement EXAM: PORTABLE CHEST 1 VIEW COMPARISON:  April 01, 2024 FINDINGS: No change in the left subclavian bipolar ICDF pacemaker device tip of the leads in good position no evidence of discontinuity Stimulator device traveling through the right hemithorax into the right lower neck. Right PICC line tip in the SVC right atrium junction. Ill-defined basilar pulmonary infiltrates appear slightly improved since prior examination. No consolidations. COPD changes without significant pleural effusions Heart and mediastinum normal. Prior CABG. IMPRESSION: *Right PICC line tip in the SVC right atrium junction. *Ill-defined basilar pulmonary infiltrates appear slightly improved since prior examination. Electronically Signed   By: Fredrich Jefferson M.D.   On: 04/02/2024 11:36   US  EKG SITE RITE Result Date: 04/02/2024 If Site Rite image not attached, placement could not be confirmed due to current cardiac rhythm.   Medications:    Scheduled Medications:  apixaban   2.5 mg Oral BID   arformoterol   15 mcg Nebulization BID   atorvastatin   80 mg Oral Daily   budesonide  (PULMICORT ) nebulizer solution  0.5 mg Nebulization BID   Chlorhexidine  Gluconate Cloth  6 each Topical Daily   dapagliflozin   propanediol  10 mg Oral Daily   digoxin   125 mcg Oral Daily   doxycycline   100 mg Oral Q12H   insulin  aspart  0-15 Units Subcutaneous TID WC   insulin  aspart  0-5 Units Subcutaneous QHS   insulin  glargine-yfgn  8 Units Subcutaneous BID   methylPREDNISolone  (SOLU-MEDROL ) injection  40 mg Intravenous Daily   potassium chloride   40 mEq Oral Daily   revefenacin   175 mcg Nebulization Daily   sodium chloride  flush  10-40 mL Intracatheter Q12H   sodium chloride  flush  3 mL Intravenous Q12H   spironolactone   25 mg Oral Daily   Infusions:  cefTRIAXone  (ROCEPHIN )  IV Stopped (04/02/24 1612)   DOBUTamine  5 mcg/kg/min (04/03/24 0600)   furosemide  (LASIX ) 200 mg in dextrose  5 % 100 mL (2 mg/mL) infusion 20 mg/hr (04/03/24 0600)   PRN Medications: acetaminophen  **OR** acetaminophen , ipratropium-albuterol , polyethylene glycol, sodium chloride , sodium chloride  flush, sodium chloride  flush  Patient Profile   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 Admitted with A/C respiratory failure 2/2 A/C HFrEF>>cardiogenic shock and COPDE.  Assessment/Plan   Acute on chronic systolic heart failure  w/ cardiogenic shock - EF has been 20-25% for the last 5 years.  - S/p [employer] ICD and Barostim - Echo 1/25: EF < 20%, mod to severely reduced RV (in setting of RSV) - NYHA IV. Functional class confounded by deconditioning and COPD.  - On DBA at 5 mcg/kg/min. CO-OX pending. Has diuresed well and is much more comfortable. Labs pending. - Good diuresis on lasix  gtt at 20/hr. Give 5 mg metolazone . Can potentially switch to bolus dosing later today.  - GDMT limited by need for BP support with midodrine . Currently off midodrine  with  stable BP. - Continue Farxiga  10 mg daily.  - Continue digoxin  0.125 mg daily.  - Continue spironolactone  25 mg daily.  - She is likely end-stage HF +/- COPD. Has not tolerate maximal dose diuretics in the outpatient and requiring frequent admissions for hypoxia and exacerbation. Palliative Care has been consulted to further discuss GOC. Code status is DNR/DNI.   Acute on chronic hypoxic respiratory failure - Hx frequent admissions for AE COPD + A/C HFrEF.  - Able to wean off BiPAP. Improving. Appreciate CCM. Started on IV ceftriaxone , nebs and steroids continued   CKD stage IIIb - Creatinine baseline ~ 1.4-1.6  - Scr this admit 1.8>1.6.  - Follow closely with diuresis.   PAF - Regular on exam today.  - Continue Eliquis  2.5 mg bid. Reduced dose (weight/creatinine).   CAD - History of CABG x 2 2019 - s/p DES ostial ramus extending to left main (2020) - No chest pain.  - Continue atorvastatin  80 mg daily - No ASA with need for [facility]   H/o SCLC - completed treatment 2015 - CT chest (5/24) soft tissue thickening along mediastinum but no discrete mass    H/o renal infarct 22' - On Eliquis . No bleeding issues.    GOC/FTT - She has had a significant functional decline in the setting of multiple end-stage disease processes. She would benefit from continuing goals of care conversations.  - She is DNR/DNI  CRITICAL CARE Performed by: Gerilyn Kobus N   Total critical care time: 14 minutes  Critical care time was exclusive of separately billable procedures and treating other patients.  Critical care was necessary to treat or prevent imminent or life-threatening deterioration.  Critical care was time spent personally by me on the following activities: development of treatment plan with patient and/or surrogate as well as nursing, discussions with consultants, evaluation of patient's response to treatment, examination of patient, obtaining history from patient or surrogate, ordering  and performing treatments and interventions, ordering and review of laboratory studies, ordering and review of radiographic studies, pulse oximetry and re-evaluation of patient's condition.   Length of Stay: 2  Syrianna Schillaci N, PA-C  04/03/2024, 6:59 AM  Advanced Heart Failure Team Pager [phone] (M-F; 7a - 5p)  Please contact [facility] for night-coverage after hours (5p -7a ) and weekends on [email or web address]

## 2024-04-03 NOTE — Evaluation (Signed)
 Occupational Therapy Evaluation Patient Details Name: Brittany [name]  MRN: [id number] DOB: 11-19-1961 Today's Date: 04/03/2024   History of Present Illness   Sheena Donegan  is a 63 y.o. female presented to hospital  04/01/24 with acute respiratory distress due to acute CHF and COPD exacerbation. Required BiPAP; Patient with recurrent hospitalizations. PMH: COPD, paroxysmal atrial fibrillation, previous chronic smoker, essential hypertension, history of small cell lung cancer treated with chemotherapy and radiation in 2015, CAD status post CABG, advanced heart failure reduced EF 25% status post defibrillator and hyperlipidemia     Clinical Impressions At baseline, pt is Independent to Mod I for ADLs, IADLs, and functional mobility without an AD or with Rollator. Pt now presents with decreased activity tolerance, generalized B UE weakness, and decreases safety and independence with functional tasks. Pt currently demonstrates ability to complete UB ADLs with Mod I to Contact guard assist and comes to long sitting in bed with use of rails with Supervision. However, pt currently requires an unknown level of assistance with LB ADLs and functional mobility/transfers as pt declined sitting EOB, OOB activity, and address LB ADLs from bed level. Pt was educated on importance of continued activity (including AROM) and was instructed in B UE AROM exercises (see details below) to increase strength and activity tolerance for carryover to functional tasks. Pt in Afib during session with HR 113-118. Pt on 2L continuous O2 through nasal cannula with sats >90% throughout session. Pt will benefit from acute OT services to address deficits outlined below and increase safety and independence with functional tasks. Post-acture rehab recommendations to be determined based on further assessment and pt prgoress in acute OT services.    If plan is discharge home, recommend the following:          Functional Status Assessment   Patient has had a recent decline in their functional status and demonstrates the ability to make significant improvements in function in a reasonable and predictable amount of time.     Equipment Recommendations   None recommended by OT (Pt already has needed equipment)     Recommendations for Other Services         Precautions/Restrictions   Precautions Precautions: Fall Restrictions Weight Bearing Restrictions Per Provider Order: No     Mobility Bed Mobility Overal bed mobility: Needs Assistance Bed Mobility: Supine to Sit     Supine to sit: Supervision, HOB elevated, Used rails     General bed mobility comments: pt pulled from [facility] elevated to long-sitting with supervision; bed brought into chair position (ICU bed) and pt eventually transitioned to supported sitting; refused sitting on EOB    Transfers                   General transfer comment: Pt declined sitting EOB and OOB activity due to fatigue and wanting to rest.      Balance Overall balance assessment:  (To be assessed further in future sessions)                                         ADL either performed or assessed with clinical judgement   ADL Overall ADL's : Needs assistance/impaired Eating/Feeding: Modified independent;Sitting (in chair position in bed)   Grooming: Set up;Sitting (in chair position in bed)   Upper Body Bathing: Contact guard assist;Sitting (in chair position in bed)       Upper Body Dressing : Contact  guard assist;Sitting (in chair position in bed)                     General ADL Comments: Limited assessment of simulated UB ADLs this session as pt declining sitting EOB, OOB activity, or addressing LB ADLs from bed level this session due to fatigue. OT plans to further assess pt funcitonal level with LB ADLs during future sessions.     Vision Baseline Vision/History: 1 Wears glasses  (intermittently) Ability to See in Adequate Light: 0 Adequate Patient Visual Report: No change from baseline       Perception         Praxis         Pertinent Vitals/Pain Pain Assessment Pain Assessment: Faces Faces Pain Scale: Hurts even more Pain Location: low back Pain Descriptors / Indicators: Aching, Constant Pain Intervention(s): Limited activity within patient's tolerance, Repositioned, Monitored during session     Extremity/Trunk Assessment Upper Extremity Assessment Upper Extremity Assessment: Right hand dominant;Generalized weakness   Lower Extremity Assessment Lower Extremity Assessment: Defer to PT evaluation   Cervical / Trunk Assessment Cervical / Trunk Assessment: Normal   Communication Communication Communication: No apparent difficulties   Cognition Arousal: Alert Behavior During Therapy: WFL for tasks assessed/performed (frustrated/angry) Cognition: No apparent impairments             OT - Cognition Comments: Pt initailly angry/frustrated, but calmed during session. Pt AAOx4. Pt cognition appears [facility] for tasks assessed. Cognition not formally screened or assessed.                 Following commands: Intact       Cueing  General Comments   Cueing Techniques: Verbal cues  Pt in afib with HR 113-118; on [place] 2L O2 with sats >90%   Exercises Exercises: General Upper Extremity General Exercises - Upper Extremity Shoulder Flexion: AROM, Both, 5 reps, Seated (in chair position in bed) Shoulder Extension: AROM, Both, 5 reps, Seated (in chair position in bed) Shoulder Horizontal ABduction: AROM, Both, 5 reps, Seated (in chair position in bed) Shoulder Horizontal ADduction: AROM, Both, 5 reps, Seated (in chair position in bed) Elbow Flexion: AROM, Both, 5 reps, Seated (in chair position in bed) Elbow Extension: AROM, Both, 5 reps, Seated (in chair position in bed)   Shoulder Instructions      Home Living Family/patient expects to be  discharged to:: Private residence Living Arrangements: Alone Available Help at Discharge: Family;Friend(s);Available PRN/intermittently Type of Home: Apartment (studio) Home Access: Level entry     Home Layout: One level     Bathroom Shower/Tub: Chief Strategy Officer: Standard Bathroom Accessibility: Yes   Home Equipment: Educational psychologist (4 wheels);BSC/3in1;Grab bars - tub/shower;Grab bars - toilet;Other (comment) (home O2)   Additional Comments: Sometimes stays at a friend's home where she has steps to enter      Prior Functioning/Environment Prior Level of Function : Independent/Modified Independent             Mobility Comments: uses home O2, BiPaP, and rollator when feeling weak ADLs Comments: Indep with ADLs. Pt states she doesn't drive often. Manages her own medications, finances, etc.    OT Problem List: Decreased activity tolerance;Cardiopulmonary status limiting activity;Decreased strength   OT Treatment/Interventions: Self-care/ADL training;Therapeutic exercise;Energy conservation;DME and/or AE instruction;Therapeutic activities;Patient/family education;Balance training      OT Goals(Current goals can be found in the care plan section)   Acute Rehab OT Goals Patient Stated Goal: to be able to rest and  return home OT Goal Formulation: With patient Time For Goal Achievement: 04/17/24 Potential to Achieve Goals: Fair ADL Goals Pt Will Perform Grooming: with modified independence;standing Pt Will Perform Lower Body Bathing: with supervision;sitting/lateral leans;sit to/from stand Pt Will Perform Lower Body Dressing: with modified independence;sitting/lateral leans;sit to/from stand Pt Will Transfer to Toilet: with modified independence;ambulating;bedside commode (with least restrictive AD) Pt Will Perform Toileting - Clothing Manipulation and hygiene: with modified independence;sitting/lateral leans;sit to/from stand Pt/caregiver will Perform  Home Exercise Program: Increased strength;Both right and left upper extremity;With theraband;Independently;With written HEP provided (Increased activity tolerance)   OT Frequency:  Min 1X/week    Co-evaluation PT/OT/SLP Co-Evaluation/Treatment: Yes Reason for Co-Treatment: Other (comment) (pt with limited activity tolerance) PT goals addressed during session: Strengthening/ROM OT goals addressed during session: Strengthening/ROM;ADL's and self-care      AM-PAC OT "6 Clicks" Daily Activity     Outcome Measure Help from another person eating meals?: None Help from another person taking care of personal grooming?: A Little Help from another person toileting, which includes using toliet, bedpan, or urinal?: A Lot Help from another person bathing (including washing, rinsing, drying)?: A Lot Help from another person to put on and taking off regular upper body clothing?: A Little Help from another person to put on and taking off regular lower body clothing?: A Lot 6 Click Score: 16   End of Session Equipment Utilized During Treatment: Oxygen Nurse Communication: Mobility status;Other (comment) (Pt only agreeable to chair position in bed)  Activity Tolerance: Patient limited by fatigue Patient left: in bed;with call bell/phone within reach;Other (comment) (in chair position)  OT Visit Diagnosis: Muscle weakness (generalized) (M62.81);Other (comment) (decreased activity tolerance)                Time: 1610-9604 OT Time Calculation (min): 14 min Charges:  OT General Charges $OT Visit: 1 Visit OT Evaluation $OT Eval Low Complexity: 1 Low  Kayon Dozier "Kyle" M., OTR/L, MA Acute Rehab [phone]   Walt Gunner 04/03/2024, 2:11 PM

## 2024-04-03 NOTE — Progress Notes (Signed)
 PT Cancellation Note  Patient Details Name: Brittany [name]  MRN: [id number] DOB: November 08, 1961   Cancelled Treatment:    Reason Eval/Treat Not Completed: Fatigue/lethargy limiting ability to participate  Patient reports feeling overwhelmed and exhausted and needs "a few hours" to rest. Pt adamant. Will return later today.    Gayle Kava, PT Acute Rehabilitation Services  Office [phone]  [place] Leep 04/03/2024, 9:12 AM

## 2024-04-03 NOTE — Progress Notes (Signed)
 MEWS Progress Note  Patient Details Name: Brittany [name]  MRN: [id number] DOB: 02-16-1961 Today's Date: 04/03/2024   MEWS Flowsheet Documentation:  Assess: MEWS Score Temp: 98.8 F (37.1 C) BP: 107/69 MAP (mmHg): 82 Pulse Rate: (!) 123 ECG Heart Rate: (!) 123 Resp: 18 Level of Consciousness: Alert SpO2: 99 % O2 Device: Nasal Cannula Patient Activity (if Appropriate): In bed O2 Flow Rate (L/min): 2 L/min FiO2 (%): 50 % Assess: MEWS Score MEWS Temp: 0 MEWS Systolic: 0 MEWS Pulse: 2 MEWS RR: 0 MEWS LOC: 0 MEWS Score: 2 MEWS Score Color: Yellow Assess: SIRS CRITERIA SIRS Temperature : 0 SIRS Respirations : 0 SIRS Pulse: 1 SIRS WBC: 0 SIRS Score Sum : 1 SIRS Temperature : 0 SIRS Pulse: 1 SIRS Respirations : 0 SIRS WBC: 0 SIRS Score Sum : 1 Assess: if the MEWS score is Yellow or Red Were vital signs accurate and taken at a resting state?: Yes Does the patient meet 2 or more of the SIRS criteria?: No MEWS guidelines implemented : Yes, yellow Treat MEWS Interventions: Considered administering scheduled or prn medications/treatments as ordered Take Vital Signs Increase Vital Sign Frequency : Yellow: Q2hr x1, continue Q4hrs until patient remains green for 12hrs Escalate MEWS: Escalate: Yellow: Discuss with charge nurse and consider notifying provider and/or RRT Notify: Charge Nurse/RN Name of Charge Nurse/RN Notified: [name], RN      [name] 04/03/2024, 10:51 PM

## 2024-04-03 NOTE — Progress Notes (Signed)
 Stable Fatigued after PT Breathing unchanged Plan Cont dobuatmine gtt and diuretics per cards  Defer timing of transfer out of ICU to HF team

## 2024-04-03 NOTE — Progress Notes (Signed)
 OT Cancellation Note  Patient Details Name: Brittany [name]  MRN: [id number] DOB: September 02, 1961   Cancelled Treatment:    Reason Eval/Treat Not Completed: Fatigue/lethargy limiting ability to participate (Pt reports feeling overwhelmed and exhausted and needs "a few hours" to rest. Pt adamant. OT to reattempt to see pt at a later time as appropriate/available.)  Ames Hoban "Jenine Mix" M., OTR/L, MA Acute Rehab [phone]  Walt Gunner 04/03/2024, 9:15 AM

## 2024-04-03 NOTE — Progress Notes (Signed)
 NAME:  Brittany [name] , MRN:  [id number], DOB:  Sep 08, 1961, LOS: 2 ADMISSION DATE:  04/01/2024, CONSULTATION DATE: 4/23 REFERRING MD: Alease Amend, CHIEF COMPLAINT: Respiratory failure  History of Present Illness:  63 year old female patient presented to the emergency room on 4/22 with rather acute onset of 2 to 3 hours shortness of breath.  Upon arrival was in acute distress pale diaphoretic wheezing received IV Solu-Medrol  bronchodilators placed on NIPPV.  Emergency room had a mild leukocytosis with white blood cell count 13.5 BNP was 4000 troponin 64 chest x-ray consistent with vascular congestion she was given IV Lasix  in addition to scheduled bronchodilators and started on oral doxycycline , IV Solu-Medrol  continued.  Advanced heart failure team was consulted, diuretics escalated felt not a candidate for advanced therapies given advanced lung disease, on 4/23 seen by a.m. rounding team from heart failure found to be more short of breath, weight was up, breathing was labored in spite of BiPAP, placed on low-dose dobutamine  transferred to the ICU emergently for acute on chronic respiratory failure.  Primary team discussed goals of care patient did not want intubation or CPR based on discussion with advanced heart failure team.  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  Chronic systolic heart failure with a EF 25%, hypertension, prior ICD, RV dysfunction  Significant Hospital Events: Including procedures, antibiotic start and stop dates in addition to other pertinent events   4/22 admitted with acute on chronic decompensated  biventricular heart failure, and possible exacerbation of COPD 4/23 moved to the ICU for progressive respiratory failure 4.24 much better. Down to 2.5 liters [place]. Co-ox 69% -2.1 liters no more wheezing  Interim History / Subjective:  Feels much better  Objective   Blood pressure (!) 104/56, pulse (!) 109, temperature 99 F (37.2 C), temperature source Oral, resp. rate 14, height 4' 11.5" (1.511 m), weight 47.4 kg, SpO2 100%. CVP:  [6 mmHg-28 mmHg] 12 mmHg  Vent Mode: PCV;BIPAP FiO2 (%):  [50 %] 50 % Set Rate:  [15 bmp] 15 bmp PEEP:  [6 cmH20] 6 cmH20   Intake/Output Summary (Last 24 hours) at 04/03/2024 0737 Last data filed at 04/03/2024 0600 Gross per 24 hour  Intake 1674.29 ml  Output 3150 ml  Net -1475.71 ml   Filed Weights   04/01/24 0610 04/01/24 2118 04/02/24 0349  Weight: 46 kg 46.3 kg 47.4 kg    Examination: General resting in bed. A little anxious still  HENT dec'd JVD MMM Pulm clear today no further wheezing Card rrr Abd soft Ext warm no sig edema Neuro intact  Resolved Hospital Problem list     Assessment & Plan:  Acute on chronic hypoxic respiratory failure secondary to acute heart failure with pulmonary edema, further complicated by underlying COPD with acute exacerbation Plan Change NIPPV to PRN Wean O2 for sats 90-92% DNR/DNI per advanced heart failure discussion Change solumedrol to pred, X 4 more days Scheduled bronchodilators and ICS (started brovana /budesonide  and yupelri ), she can go back to Trelegy Ellipta  at dc w/ PRN duoneb  Day #3/5 doxycycline  and day 2/5 ceftriaxone   As needed chest x-ray No need for arterial blood gas will not change management  Acute on chronic biventricular heart  failure, with [place]  heart association class IV dyspnea, and last echocardiogram showing EF less than 20% with severe RV dysfunction -CVP 8, SCVO2 68% on 5 mcg/min dobut Plan Cont dobutamine  gtt w/ HF planning to wean per co-ox Cont aggressive diuresis as  tolerated by BUN/cr (Adv HF planning on changing to scheduled lasix  from gtt) Trend CVP and co-ox Cont midodrine  for MAP >65 (her BP limits GDMT) Continue telemetry Continue Farxiga  Continue digoxin    CKD stage III with rising serum creatinine from baseline. Had been improving w/ diuresis and hemodynamic optimization  Poor dialysis candidate given ES disease  Plan Avoid hypotension F/u repeat am chem (sent and pending) Maximize cardiac output Renal dose medications Strict intake output   History of PAF Plan Continue telemetry monitoring Eliquis  at reduced weight dosing  History of coronary artery disease with CABG in 2019, and drug-eluting stent in 2020 Plan Continuing atorvastatin  80 mg daily No need for aspirin  given already on Eliquis   Hyperglycemia (exacerbated by systemic steroids) She did get fairly hyperglycemic yesterday after starting diet Plan Sliding scale insulin  Goal 140s to 180  Remote history of small cell lung cancer treated with chemo and XRT has been stable Plan Given how advanced her heart failure is no follow-up recommended as she would not be a candidate for additional therapy  History of renal infarct Plan Trend creatinine  Goals of care.  She is end-stage heart failure. Plan DNR/DNI   Best Practice (right click and "Reselect all SmartList Selections" daily)   Diet/type: NPO w/ oral meds DVT prophylaxis DOAC Pressure ulcer(s): N/A GI prophylaxis: N/A Lines: Central line Foley:  Yes, and it is still needed Code Status:  DNR Last date of multidisciplinary goals of care discussion [see note per HF ]   Critical care time: NA. May be able to go out if ICU later today  My time 36 minutes

## 2024-04-03 NOTE — Evaluation (Signed)
 Physical Therapy Evaluation Patient Details Name: Brittany [name]  MRN: [id number] DOB: 1961-06-05 Today's Date: 04/03/2024  History of Present Illness  Brittany [name]  is a 63 y.o. female presented to hospital  04/01/24 with acute respiratory distress due to acute CHF and COPD exacerbation. Required BiPAP; Patient with recurrent hospitalizations. PMH: COPD, paroxysmal atrial fibrillation, previous chronic smoker, essential hypertension, history of small cell lung cancer treated with chemotherapy and radiation in 2015, CAD status post CABG, advanced heart failure reduced EF 25% status post defibrillator and and hyperlipidemia  Clinical Impression   Pt admitted secondary to problem above with deficits below. PTA patient was living alone in a studio apartment with a level entry. She was occasionally using her rollator and consistently using home O2.  Pt currently requires an unknown level of assistance as she was fatigued and refused OOB mobility this date. Educated on importance of continued activity (including AROM). Patient performed limited LE exercises and assisted with transition to chair position. Anticipate patient will benefit from PT to address problems listed below.Will continue to follow acutely to maximize functional mobility independence and safety. Will continue to assess discharge needs as pt progresses with mobility.          If plan is discharge home, recommend the following:     Can travel by private vehicle        Equipment Recommendations None recommended by PT  Recommendations for Other Services       Functional Status Assessment  (Anticipated will decline due to lack of mobility (pt refusing))     Precautions / Restrictions Precautions Precautions: Fall      Mobility  Bed Mobility Overal bed mobility: Needs Assistance Bed Mobility: Supine to Sit     Supine to sit: Used rails, HOB elevated, Supervision     General bed mobility comments: pt  pulled from [facility] elevated to long-sitting with supervision; bed brought into chair position (ICU bed) and pt eventually transitioned to supported sitting; refused sitting on EOB    Transfers                   General transfer comment: pt refused due to fatigue and wanting to rest    Ambulation/Gait                  Stairs            Wheelchair Mobility     Tilt Bed    Modified Rankin (Stroke Patients Only)       Balance Overall balance assessment:  (TBA)                                           Pertinent Vitals/Pain Pain Assessment Pain Assessment: Faces Faces Pain Scale: Hurts even more Pain Location: low back Pain Descriptors / Indicators: Aching, Constant Pain Intervention(s): Limited activity within patient's tolerance, Repositioned    Home Living Family/patient expects to be discharged to:: Private residence Living Arrangements: Alone Available Help at Discharge: Family;Friend(s);Available PRN/intermittently Type of Home: Apartment (studio) Home Access: Level entry       Home Layout: One level Home Equipment: Educational psychologist (4 wheels);BSC/3in1;Grab bars - tub/shower;Grab bars - toilet;Other (comment) (home O2) Additional Comments: Sometimes stays at a friend's home where she has steps to enter    Prior Function Prior Level of Function : Independent/Modified Independent  Mobility Comments: uses home O2, BiPaP, and rollator when feeling weak ADLs Comments: Indep with ADLs. Pt states she doesn't drive often. Manages her own medications, finances, etc.     Extremity/Trunk Assessment   Upper Extremity Assessment Upper Extremity Assessment: Defer to OT evaluation    Lower Extremity Assessment Lower Extremity Assessment: Generalized weakness    Cervical / Trunk Assessment Cervical / Trunk Assessment: Normal  Communication   Communication Communication: No apparent difficulties     Cognition Arousal: Alert Behavior During Therapy:  (frustrated/angry)   PT - Cognitive impairments: No apparent impairments, Difficult to assess Difficult to assess due to:  (frustrated with process and deferred assessment)                       Following commands: Intact       Cueing Cueing Techniques: Verbal cues     General Comments General comments (skin integrity, edema, etc.): Pt in afib with HR 113-118; on [place] O2 with sats >90%    Exercises General Exercises - Lower Extremity Ankle Circles/Pumps: AROM, Both, 5 reps, Seated   Assessment/Plan    PT Assessment Patient needs continued PT services  PT Problem List Decreased strength;Decreased activity tolerance;Decreased mobility;Decreased knowledge of use of DME;Cardiopulmonary status limiting activity       PT Treatment Interventions DME instruction;Gait training;Functional mobility training;Therapeutic activities;Therapeutic exercise;Balance training;Patient/family education    PT Goals (Current goals can be found in the Care Plan section)  Acute Rehab PT Goals Patient Stated Goal: be able to walk down the hall without assist PT Goal Formulation: With patient Time For Goal Achievement: 04/17/24 Potential to Achieve Goals: Good    Frequency Min 2X/week     Co-evaluation PT/OT/SLP Co-Evaluation/Treatment: Yes Reason for Co-Treatment: Other (comment) (pt with limited activity tolerance) PT goals addressed during session: Strengthening/ROM         AM-PAC PT "6 Clicks" Mobility  Outcome Measure Help needed turning from your back to your side while in a flat bed without using bedrails?: None Help needed moving from lying on your back to sitting on the side of a flat bed without using bedrails?: A Little Help needed moving to and from a bed to a chair (including a wheelchair)?: Total Help needed standing up from a chair using your arms (e.g., wheelchair or bedside chair)?: Total Help needed to walk in  hospital room?: Total Help needed climbing 3-5 steps with a railing? : Total 6 Click Score: 11    End of Session Equipment Utilized During Treatment: Oxygen Activity Tolerance: Patient limited by fatigue Patient left: in bed;with call bell/phone within reach;Other (comment) (chair position) Nurse Communication: Other (comment) (only agreed to chair position) PT Visit Diagnosis: Muscle weakness (generalized) (M62.81);Difficulty in walking, not elsewhere classified (R26.2)    Time: 1610-9604 PT Time Calculation (min) (ACUTE ONLY): 14 min   Charges:   PT Evaluation $PT Eval Low Complexity: 1 Low   PT General Charges $$ ACUTE PT VISIT: 1 Visit          Gayle Kava, PT Acute Rehabilitation Services  Office [phone]   Gu[place]ford Leep 04/03/2024, 1:45 PM

## 2024-04-03 NOTE — Telephone Encounter (Signed)
 Spoke with patient.  She is currently hospitalized but starting to improve a little.  Advised will check with her when she is discharged.  ICM remote transmission rescheduled from 4/28 to 5/5.

## 2024-04-03 NOTE — Consult Note (Signed)
 Palliative Care Consult Note                                  Date: 04/03/2024   Patient Name: Brittany [name]   DOB: Oct 24, 1961  MRN: [id number]  Age / Sex: 63 y.o., female  PCP: Abraham Abo, MD Referring Physician: Trevor Fudge, MD  Reason for Consultation: {Reason for Consult:23484}  HPI/Patient Profile: 63 y.o. female  with past medical history of chronic HFrEF, severe COPD and chronic respiratory failure, paroxysmal atrial fibrillation,  admitted on 04/01/2024 with ***.   Past Medical History:  Diagnosis Date   Acute respiratory failure (HCC) 05/05/2018   Acute systolic congestive heart failure (HCC) 02/03/2018   AICD (automatic cardioverter/defibrillator) present    Allergy    Anxiety    Asthma    DM2 (diabetes mellitus, type 2) (HCC) 10/20/2020   Hypertension    PAF (paroxysmal atrial fibrillation) (HCC)    Presence of permanent cardiac pacemaker    Prophylactic measure 08/03/14-08/19/14   Prophyl. 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   LIMA to LAD, SVG to D1, SVG to OM1, EVH via right thigh with implantation of Impella LD LVAD via direct aortic approach   Small cell lung cancer (HCC) 03/16/2014    Subjective:   I have reviewed medical records including EPIC notes, labs and imaging, received report from the team, and assessed the patient at bedside.   I met with *** to discuss diagnosis, prognosis, GOC, EOL wishes, disposition, and options.  I introduced Palliative Medicine as specialized medical care for people living with serious illness. It focuses on providing relief from the symptoms and stress of a serious illness.   We discussed patient's current illness and what it means in the larger context of his/her ongoing co-morbidities. Current clinical status was reviewed. Natural disease trajectory of *** was discussed.  Created space and opportunity for patient and family to explore  thoughts and feelings regarding current medical situation.  Values and goals of care important to patient and family were attempted to be elicited.  A discussion was had today regarding advanced directives. Concepts specific to code status, artifical feeding and hydration, continued IV antibiotics and rehospitalization was had.  The MOST form was introduced and discussed.  Questions and concerns addressed. Patient/family encouraged to call with questions or concerns.     Life Review: ***  Functional Status: ***  Patient/Family Understanding of Illness: ***  Patient Values: ***  Goals: ***  Additional Discussion: ***  Review of Systems  Objective:   Primary Diagnoses: Present on Admission:  Acute respiratory failure (HCC)  Acute on chronic systolic CHF (congestive heart failure) (HCC)  COPD with acute exacerbation (HCC)  Troponin level elevated  CKD stage 3b, GFR 30-44 ml/min (HCC)  Essential hypertension  Type 2 diabetes mellitus with hyperlipidemia (HCC)   Physical Exam  Vital Signs:  BP 103/63   Pulse (!) 116   Temp 98.5 F (36.9 C) (Oral)   Resp (!) 21   Ht 4' 11.5" (1.511 m)   Wt 47.4 kg   SpO2 98%   BMI 20.75 kg/m   Palliative Assessment/Data: ***     Assessment & Plan:   SUMMARY OF RECOMMENDATIONS   ***  Primary Decision Maker: {Primary Decision NGEXB:28413}  Code Status/Advance Care Planning: {Palliative Code status:23503}  Symptom Management:  ***  Prognosis:  {Palliative Care Prognosis:23504}  Discharge Planning:  {Palliative  dispostion:23505}   Discussed with: ***    Thank you for allowing us  to participate in the care of Brittany [name]    Time Total: ***  Greater than 50%  of this time was spent counseling and coordinating care related to the above assessment and plan.  Signed by: Maisie Scotland, NP Palliative Medicine Team  Team Phone # [phone]  For individual providers, please see  AMION

## 2024-04-04 DIAGNOSIS — I5021 Acute systolic (congestive) heart failure: Secondary | ICD-10-CM | POA: Diagnosis not present

## 2024-04-04 DIAGNOSIS — I5082 Biventricular heart failure: Secondary | ICD-10-CM

## 2024-04-04 DIAGNOSIS — N183 Chronic kidney disease, stage 3 unspecified: Secondary | ICD-10-CM

## 2024-04-04 DIAGNOSIS — J441 Chronic obstructive pulmonary disease with (acute) exacerbation: Secondary | ICD-10-CM | POA: Diagnosis not present

## 2024-04-04 DIAGNOSIS — J9621 Acute and chronic respiratory failure with hypoxia: Secondary | ICD-10-CM | POA: Diagnosis not present

## 2024-04-04 LAB — BASIC METABOLIC PANEL WITH GFR
Anion gap: 17 — ABNORMAL HIGH (ref 5–15)
BUN: 53 mg/dL — ABNORMAL HIGH (ref 8–23)
CO2: 29 mmol/L (ref 22–32)
Calcium: 10.8 mg/dL — ABNORMAL HIGH (ref 8.9–10.3)
Chloride: 87 mmol/L — ABNORMAL LOW (ref 98–111)
Creatinine, Ser: 1.69 mg/dL — ABNORMAL HIGH (ref 0.44–1.00)
GFR, Estimated: 34 mL/min — ABNORMAL LOW (ref >60)
Glucose, Bld: 176 mg/dL — ABNORMAL HIGH (ref 70–99)
Potassium: 3.5 mmol/L (ref 3.5–5.1)
Sodium: 133 mmol/L — ABNORMAL LOW (ref 135–145)

## 2024-04-04 LAB — MAGNESIUM: Magnesium: 2.4 mg/dL (ref 1.7–2.4)

## 2024-04-04 LAB — GLUCOSE, CAPILLARY
Glucose-Capillary: 174 mg/dL — ABNORMAL HIGH (ref 70–99)
Glucose-Capillary: 184 mg/dL — ABNORMAL HIGH (ref 70–99)
Glucose-Capillary: 230 mg/dL — ABNORMAL HIGH (ref 70–99)
Glucose-Capillary: 273 mg/dL — ABNORMAL HIGH (ref 70–99)

## 2024-04-04 LAB — COOXEMETRY PANEL
Carboxyhemoglobin: 1.8 % — ABNORMAL HIGH (ref 0.5–1.5)
Methemoglobin: 0.7 % (ref 0.0–1.5)
O2 Saturation: 65 %
Total hemoglobin: 13.9 g/dL (ref 12.0–16.0)

## 2024-04-04 MED ORDER — INSULIN ASPART 100 UNIT/ML IJ SOLN
0.0000 [IU] | Freq: Three times a day (TID) | INTRAMUSCULAR | Status: DC
  Administered 2024-04-04: 4 [IU] via SUBCUTANEOUS
  Administered 2024-04-04 – 2024-04-05 (×2): 7 [IU] via SUBCUTANEOUS
  Administered 2024-04-05 (×2): 3 [IU] via SUBCUTANEOUS
  Administered 2024-04-06: 7 [IU] via SUBCUTANEOUS
  Administered 2024-04-07: 3 [IU] via SUBCUTANEOUS

## 2024-04-04 MED ORDER — IVABRADINE HCL 5 MG PO TABS
7.5000 mg | ORAL_TABLET | Freq: Two times a day (BID) | ORAL | Status: DC
  Administered 2024-04-04 – 2024-04-07 (×7): 7.5 mg via ORAL
  Filled 2024-04-04 (×7): qty 1

## 2024-04-04 MED ORDER — INSULIN ASPART 100 UNIT/ML IJ SOLN: 3.0000 [IU] | Freq: Three times a day (TID) | INTRAMUSCULAR | Status: DC

## 2024-04-04 NOTE — Progress Notes (Signed)
 NAME:  Brittany [name] , MRN:  [id number], DOB:  03/22/1961, LOS: 3 ADMISSION DATE:  04/01/2024, CONSULTATION DATE: 4/23 REFERRING MD: Alease Amend, CHIEF COMPLAINT: Respiratory failure  History of Present Illness:  63 year old female patient presented to the emergency room on 4/22 with rather acute onset of 2 to 3 hours shortness of breath.  Upon arrival was in acute distress pale diaphoretic wheezing received IV Solu-Medrol  bronchodilators placed on NIPPV.  Emergency room had a mild leukocytosis with white blood cell count 13.5 BNP was 4000 troponin 64 chest x-ray consistent with vascular congestion she was given IV Lasix  in addition to scheduled bronchodilators and started on oral doxycycline , IV Solu-Medrol  continued.  Advanced heart failure team was consulted, diuretics escalated felt not a candidate for advanced therapies given advanced lung disease, on 4/23 seen by a.m. rounding team from heart failure found to be more short of breath, weight was up, breathing was labored in spite of BiPAP, placed on low-dose dobutamine  transferred to the ICU emergently for acute on chronic respiratory failure.  Primary team discussed goals of care patient did not want intubation or CPR based on discussion with advanced heart failure team.  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  Chronic systolic heart failure with a EF 25%, hypertension, prior ICD, RV dysfunction  Significant Hospital Events: Including procedures, antibiotic start and stop dates in addition to other pertinent events   4/22 admitted with acute on chronic decompensated  biventricular heart failure, and possible exacerbation of COPD 4/23 moved to the ICU for progressive respiratory failure 4.24 much better. Down to 2.5 liters [place]. Co-ox 69% -2.1 liters no more wheezing 4/25 off lasix  gtt. Dobutamine  down to 2.5 mcg/kg/min. Transferred to progressive, stopping steroids   Interim History / Subjective:  Continues to feel better   Objective   Blood pressure 95/67, pulse (!) 130, temperature 98 F (36.7 C), temperature source Oral, resp. rate 20, height 4' 11.5" (1.511 m), weight 42.2 kg, SpO2 99%. CVP:  [0 mmHg-24 mmHg] 13 mmHg      Intake/Output Summary (Last 24 hours) at 04/04/2024 0940 Last data filed at 04/04/2024 0900 Gross per 24 hour  Intake 644.08 ml  Output 2900 ml  Net -2255.92 ml   Filed Weights   04/01/24 2118 04/02/24 0349 04/04/24 0440  Weight: 46.3 kg 47.4 kg 42.2 kg    Examination: General laying in bed no distress  Hent ncat no jvd Pulm cl faint basilar crackles no accessory use back on 23 lpm Card rrr but tachy  Abd soft Ext no sig edema brisk cr warm Neuro intact   Resolved Hospital Problem list     Assessment & Plan:  Acute on chronic hypoxic respiratory failure secondary to acute heart failure with pulmonary edema, further complicated by underlying COPD with acute exacerbation  -given how rapid she responded to diuretics and HF optimization think bronchospasm was more "cardiac wheeze" as was completely resolved w/in 24 hrs of lasix  and inotropes  Plan Change NIPPV to PRN Wean O2 for sats 90-92% DNR/DNI per advanced heart failure discussion Dc oral steroids (see above; also says it makes her irritable AND won't help w/ water retention) Scheduled bronchodilators and ICS (started brovana /budesonide   and yupelri ), she can go back to Trelegy Ellipta  at dc w/ PRN duoneb  Day #4/5 doxycycline  and day 3/5 ceftriaxone   As needed chest x-ray No need for arterial blood gas will not change management  Acute on chronic biventricular  heart failure, with [place]  heart association class IV dyspnea, and last echocardiogram showing EF less than 20% with severe RV dysfunction -CVP 8, SCVO2 68% on 5 mcg/min dobut Plan Dobutamine  per HF (now down to 2.5 mcg/kg/min) Diuretics as directed by HF team w/ plan to start torsemide  tomorrow  Trend CVP and co-ox Cont midodrine  for MAP >65 (her BP limits GDMT) Continue telemetry Continue Farxiga  Continue digoxin    CKD stage III with rising serum creatinine from baseline. Had been improving w/ diuresis and hemodynamic optimization  Poor dialysis candidate given ES disease  Plan Cont to monitor w/ active diuresis  Am chem  Strict I&O Renal dose meds  History of PAF Plan Continue telemetry monitoring Eliquis  at reduced weight dosing  History of coronary artery disease with CABG in 2019, and drug-eluting stent in 2020 Plan Continuing atorvastatin  80 mg daily No need for aspirin  given already on Eliquis   Hyperglycemia (exacerbated by systemic steroids) She did get fairly hyperglycemic yesterday after starting diet Plan Sliding scale insulin , not really at optimal coverage  Change to resistant scale, was going to add meal time coverage but will dc pred instead  Goal 140s to 180  Remote history of small cell lung cancer treated with chemo and XRT has been stable Plan Given how advanced her heart failure is no follow-up recommended as she would not be a candidate for additional therapy  History of renal infarct Plan Trend creatinine  Goals of care.  She is end-stage heart failure. Plan DNR/DNI   Best Practice (right click and "Reselect all SmartList Selections" daily)   Diet/type: NPO w/ oral meds DVT prophylaxis DOAC Pressure ulcer(s): N/A GI prophylaxis: N/A Lines: Central line Foley:  Yes, and it is still needed Code Status:  DNR Last date of multidisciplinary goals of care discussion [see note per HF ]   Critical care s/o  To triad again in am

## 2024-04-04 NOTE — Plan of Care (Signed)
  Problem: Education: Goal: Knowledge of General Education information will improve Description: Including pain rating scale, medication(s)/side effects and non-pharmacologic comfort measures Outcome: Progressing   Problem: Clinical Measurements: Goal: Ability to maintain clinical measurements within normal limits will improve Outcome: Progressing Goal: Respiratory complications will improve Outcome: Progressing Goal: Cardiovascular complication will be avoided Outcome: Progressing   Problem: Nutrition: Goal: Adequate nutrition will be maintained Outcome: Progressing   Problem: Coping: Goal: Level of anxiety will decrease Outcome: Progressing   Problem: Elimination: Goal: Will not experience complications related to bowel motility Outcome: Progressing Goal: Will not experience complications related to urinary retention Outcome: Progressing   Problem: Safety: Goal: Ability to remain free from injury will improve Outcome: Progressing

## 2024-04-04 NOTE — Progress Notes (Signed)
 Pt HR climbing into 130s, sinus tach. BP: 102/63 (77). CVP: 14. Pt A&Ox4, in no distress. On dobutamine  and lasix  gtt. Annitta Kindler, MD paged and notified. MD is okay w/ HR in 130s. States to page if pt goes into new arrhythmia. Will continue to monitor.

## 2024-04-04 NOTE — Plan of Care (Signed)
  Problem: Health Behavior/Discharge Planning: Goal: Ability to manage health-related needs will improve Outcome: Progressing   Problem: Clinical Measurements: Goal: Ability to maintain clinical measurements within normal limits will improve Outcome: Progressing Goal: Cardiovascular complication will be avoided Outcome: Progressing   Problem: Fluid Volume: Goal: Ability to maintain a balanced intake and output will improve Outcome: Progressing   Problem: Metabolic: Goal: Ability to maintain appropriate glucose levels will improve Outcome: Progressing

## 2024-04-04 NOTE — Progress Notes (Signed)
 ERROR

## 2024-04-04 NOTE — Progress Notes (Addendum)
 Advanced Heart Failure Rounding Note  Cardiologist: Dr. Julane Ny  Chief Complaint: Acute on chronic systolic CHF/cardiogenic shock  Subjective:    Much more comfortable this am. Has been able to ambulate in her room and wash up.  Remains on DBA at 5 mcg/kg/min and lasix  gtt at 20  CVP <5.  Diuresed well with lasix  gtt and metolazone . -3.6L UOP. Weight down ~11lbs.   Objective:    Weight Range: 42.2 kg Body mass index is 18.48 kg/m.   Vital Signs:   Temp:  [98 F (36.7 C)-99.2 F (37.3 C)] 98 F (36.7 C) (04/25 0800) Pulse Rate:  [107-133] 130 (04/25 0800) Resp:  [13-26] 20 (04/25 0800) BP: (91-124)/(55-87) 95/67 (04/25 0800) SpO2:  [93 %-100 %] 99 % (04/25 0748) Weight:  [42.2 kg] 42.2 kg (04/25 0440) Last BM Date : 03/31/24  Weight change: Filed Weights   04/01/24 2118 04/02/24 0349 04/04/24 0440  Weight: 46.3 kg 47.4 kg 42.2 kg   Intake/Output:  Intake/Output Summary (Last 24 hours) at 04/04/2024 0806 Last data filed at 04/04/2024 6962 Gross per 24 hour  Intake 524.08 ml  Output 2900 ml  Net -2375.92 ml    Physical Exam  General:  frail, chronically ill appearing.   HEENT: +[place] Neck: supple. JVD flat.  Cor: PMI nondisplaced. Tachy rate & reg rhythm. No rubs, gallops or murmurs. Lungs: clear Extremities: no cyanosis, clubbing, rash, edema  Neuro: alert & oriented x 3. Moves all 4 extremities w/o difficulty. Affect pleasant.  Telemetry   ST 130s (Personally reviewed)    EKG    No new ECG to review  Labs    CBC Recent Labs    04/02/24 0333 04/03/24 1806  WBC 10.3 11.3*  HGB 10.6* 12.8  HCT 32.8* 39.9  MCV 90.6 91.1  PLT 185 219   Basic Metabolic Panel Recent Labs    95/28/41 0333 04/03/24 0933 04/03/24 1930 04/04/24 0454  NA 136   < > 139 133*  K 3.9   < > 4.5 3.5  CL 103   < > 94* 87*  CO2 23   < > 29 29  GLUCOSE 282*   < > 74 176*  BUN 31*   < > 43* 53*  CREATININE 1.61*   < > 1.49* 1.69*  CALCIUM  9.4   < > 11.0* 10.8*  MG  2.4  --   --  2.4   < > = values in this interval not displayed.   BNP: BNP (last 3 results) Recent Labs    02/14/24 2132 03/13/24 1127 04/01/24 0625  BNP 2,541.1* 1,892.9* 4,049.6*   Imaging   No results found.   Medications:    Scheduled Medications:  apixaban   2.5 mg Oral BID   arformoterol   15 mcg Nebulization BID   atorvastatin   80 mg Oral Daily   budesonide  (PULMICORT ) nebulizer solution  0.5 mg Nebulization BID   Chlorhexidine  Gluconate Cloth  6 each Topical Daily   dapagliflozin  propanediol  10 mg Oral Daily   digoxin   125 mcg Oral Daily   doxycycline   100 mg Oral Q12H   fluticasone   1 spray Each Nare Daily   insulin  aspart  0-15 Units Subcutaneous TID WC   insulin  aspart  0-5 Units Subcutaneous QHS   potassium chloride   40 mEq Oral Daily   predniSONE   40 mg Oral Q breakfast   revefenacin   175 mcg Nebulization Daily   sodium chloride  flush  10-40 mL Intracatheter Q12H   sodium  chloride flush  3 mL Intravenous Q12H   spironolactone   25 mg Oral Daily   Infusions:  cefTRIAXone  (ROCEPHIN )  IV Stopped (04/03/24 1539)   DOBUTamine  5 mcg/kg/min (04/04/24 0512)   furosemide  (LASIX ) 200 mg in dextrose  5 % 100 mL (2 mg/mL) infusion 20 mg/hr (04/04/24 0512)   PRN Medications: acetaminophen  **OR** acetaminophen , ipratropium-albuterol , polyethylene glycol, sodium chloride , sodium chloride  flush, sodium chloride  flush  Patient Profile   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 Admitted with A/C respiratory failure 2/2 A/C HFrEF>>cardiogenic shock and COPDE.  Assessment/Plan   Acute on chronic systolic heart failure w/ cardiogenic shock - EF has been 20-25% for the last 5 years.  -  S/p Medtronic ICD and Barostim - Echo 1/25: EF < 20%, mod to severely reduced RV (in setting of RSV) - NYHA IV. Functional class confounded by deconditioning and COPD.  - On DBA at 5 mcg/kg/min. CO-OX 65%. Has diuresed well with inotropic support. Wean DBA to 2.5 today. (Suspect contributing to tachycardia) - Good diuresis yesterday on lasix  gtt at 20/hr +5 mg metolazone . Hold further diuretics today. Plan to start Torsemide  100 mg BID tomorrow.  - GDMT limited by need for BP support with midodrine . Currently off midodrine  with stable BP. - Continue Farxiga  10 mg daily.  - Continue digoxin  0.125 mg daily.  - Continue spironolactone  25 mg daily.  - Restart home ivabradine  7.5 mg BID - She is likely end-stage HF +/- COPD. Has not tolerate maximal dose diuretics in the outpatient and requiring frequent admissions for hypoxia and exacerbation. Palliative Care has been consulted to further discuss GOC. Code status is DNR/DNI.   Acute on chronic hypoxic respiratory failure - Hx frequent admissions for AE COPD + A/C HFrEF.  - Stable off BiPAP. Improving. On IV ceftriaxone , nebs and steroids continued   CKD stage IIIb - Creatinine baseline ~ 1.4-1.6  - Scr this admit 1.8>1.6.  - Avoid hypotension   PAF - Tachy regular on exam today.  - Continue Eliquis  2.5 mg bid. Reduced dose (weight/creatinine).   CAD - History of CABG x 2 2019 - s/p DES ostial ramus extending to left main (2020) - No chest pain.  - Continue atorvastatin  80 mg daily - No ASA with need for [facility]   H/o SCLC - completed treatment 2015 - CT chest (5/24) soft tissue thickening along mediastinum but no discrete mass    H/o renal infarct 22' - On Eliquis . No bleeding issues.    GOC/FTT - She has had a significant functional decline in the setting of multiple end-stage disease processes. She would benefit from continuing goals of care conversations.  - She is DNR/DNI  Length of Stay: 3  Sheryl Donna, NP  04/04/2024, 8:06  AM  Advanced Heart Failure Team Pager [phone] (M-F; 7a - 5p)  Please contact [facility] for night-coverage after hours (5p -7a ) and weekends on [email or web address]   Patient seen and examined with the above-signed Advanced Practice Provider and/or Housestaff. I personally reviewed laboratory data, imaging studies and relevant notes. I independently examined the patient and formulated the important aspects of the plan. I have edited the note to reflect any of my changes or  salient points. I have personally discussed the plan with the patient and/or family.  Now out of ICU. Remains on DBA 5 and lasix  gtt at 20. Weight down 8.5 pounds. Breathing much better. Ambulating room. Scr improving  General:  Frail appearing. No resp difficulty HEENT: normal Neck: supple. no JVD. Carotids 2+ bilat; no bruits. No lymphadenopathy or thryomegaly appreciated. Cor:  Regular rate & rhythm. No rubs, gallops or murmurs. Lungs: coarse Abdomen: soft, nontender, nondistended. No hepatosplenomegaly. No bruits or masses. Good bowel sounds. Extremities: no cyanosis, clubbing, rash, edema Neuro: alert & orientedx3, cranial nerves grossly intact. moves all 4 extremities w/o difficulty. Affect pleasant  She is much improved. Can stop lasix . Wean DBA to 2.5. Ambulate.   Follow closely.   Jules Oar, MD  9:07 AM

## 2024-04-05 DIAGNOSIS — J9601 Acute respiratory failure with hypoxia: Secondary | ICD-10-CM | POA: Diagnosis not present

## 2024-04-05 DIAGNOSIS — N1832 Chronic kidney disease, stage 3b: Secondary | ICD-10-CM | POA: Diagnosis not present

## 2024-04-05 DIAGNOSIS — I5023 Acute on chronic systolic (congestive) heart failure: Secondary | ICD-10-CM | POA: Diagnosis not present

## 2024-04-05 LAB — COOXEMETRY PANEL
Carboxyhemoglobin: 1.3 % (ref 0.5–1.5)
Methemoglobin: <0.7 % (ref 0.0–1.5)
O2 Saturation: 54.6 %
Total hemoglobin: 14.7 g/dL (ref 12.0–16.0)

## 2024-04-05 LAB — BASIC METABOLIC PANEL WITH GFR
Anion gap: 12 (ref 5–15)
BUN: 65 mg/dL — ABNORMAL HIGH (ref 8–23)
CO2: 30 mmol/L (ref 22–32)
Calcium: 10.1 mg/dL (ref 8.9–10.3)
Chloride: 92 mmol/L — ABNORMAL LOW (ref 98–111)
Creatinine, Ser: 1.58 mg/dL — ABNORMAL HIGH (ref 0.44–1.00)
GFR, Estimated: 37 mL/min — ABNORMAL LOW (ref >60)
Glucose, Bld: 133 mg/dL — ABNORMAL HIGH (ref 70–99)
Potassium: 3.5 mmol/L (ref 3.5–5.1)
Sodium: 134 mmol/L — ABNORMAL LOW (ref 135–145)

## 2024-04-05 LAB — GLUCOSE, CAPILLARY
Glucose-Capillary: 129 mg/dL — ABNORMAL HIGH (ref 70–99)
Glucose-Capillary: 206 mg/dL — ABNORMAL HIGH (ref 70–99)
Glucose-Capillary: 138 mg/dL — ABNORMAL HIGH (ref 70–99)
Glucose-Capillary: 259 mg/dL — ABNORMAL HIGH (ref 70–99)

## 2024-04-05 LAB — MAGNESIUM: Magnesium: 2.2 mg/dL (ref 1.7–2.4)

## 2024-04-05 MED ORDER — TORSEMIDE 100 MG PO TABS
100.0000 mg | ORAL_TABLET | Freq: Two times a day (BID) | ORAL | Status: DC
  Administered 2024-04-05 – 2024-04-07 (×5): 100 mg via ORAL
  Filled 2024-04-05 (×5): qty 1

## 2024-04-05 NOTE — Progress Notes (Signed)
 Back on the floor after MRI.

## 2024-04-05 NOTE — Progress Notes (Signed)
 Advanced Heart Failure Rounding Note  Cardiologist: Dr. Julane Ny  Chief Complaint: Acute on chronic systolic CHF/cardiogenic shock  Subjective:    Feeling somewhat fatigued this morning, some back pain from lying in bed. Her breathing is much improved and on room air.  Coox is borderline at 54. Plan to restart oral diuretics today, turn off dobutamine  tomorrow. If stable can discharge Monday morning.   Objective:    Weight Range: 43 kg Body mass index is 18.83 kg/m.   Vital Signs:   Temp:  [98.2 F (36.8 C)-99 F (37.2 C)] 98.6 F (37 C) (04/26 0537) Pulse Rate:  [100-106] 100 (04/26 0537) Resp:  [16-18] 18 (04/26 0537) BP: (92-110)/(55-68) 100/56 (04/26 0951) SpO2:  [96 %-99 %] 99 % (04/26 0841) Weight:  [43 kg] 43 kg (04/26 0537) Last BM Date : 03/31/24  Weight change: Filed Weights   04/02/24 0349 04/04/24 0440 04/05/24 0537  Weight: 47.4 kg 42.2 kg 43 kg   Intake/Output:  Intake/Output Summary (Last 24 hours) at 04/05/2024 1220 Last data filed at 04/04/2024 1645 Gross per 24 hour  Intake 63.13 ml  Output 1100 ml  Net -1036.87 ml    Physical Exam  General:  frail, chronically ill appearing.   Cor: Tachycardiac, systolic murmur, JVP mildly elevated with v waves Lungs: Mild bilateral rhonchi Extremities: no cyanosis, clubbing, rash, edema  Neuro: alert & oriented x 3. Moves all 4 extremities w/o difficulty. Affect pleasant.  Telemetry   ST 110s (Personally reviewed)     Labs    CBC Recent Labs    04/03/24 1806  WBC 11.3*  HGB 12.8  HCT 39.9  MCV 91.1  PLT 219   Basic Metabolic Panel Recent Labs    16/10/96 0454 04/05/24 0500  NA 133* 134*  K 3.5 3.5  CL 87* 92*  CO2 29 30  GLUCOSE 176* 133*  BUN 53* 65*  CREATININE 1.69* 1.58*  CALCIUM  10.8* 10.1  MG 2.4 2.2   BNP: BNP (last 3 results) Recent Labs    02/14/24 2132 03/13/24 1127 04/01/24 0625  BNP 2,541.1* 1,892.9* 4,049.6*   Imaging   No results  found.   Medications:    Scheduled Medications:  apixaban   2.5 mg Oral BID   arformoterol   15 mcg Nebulization BID   atorvastatin   80 mg Oral Daily   budesonide  (PULMICORT ) nebulizer solution  0.5 mg Nebulization BID   Chlorhexidine  Gluconate Cloth  6 each Topical Daily   dapagliflozin  propanediol  10 mg Oral Daily   digoxin   125 mcg Oral Daily   fluticasone   1 spray Each Nare Daily   insulin  aspart  0-20 Units Subcutaneous TID WC   insulin  aspart  0-5 Units Subcutaneous QHS   ivabradine   7.5 mg Oral BID WC   potassium chloride   40 mEq Oral Daily   revefenacin   175 mcg Nebulization Daily   sodium chloride  flush  10-40 mL Intracatheter Q12H   sodium chloride  flush  3 mL Intravenous Q12H   spironolactone   25 mg Oral Daily   torsemide   100 mg Oral BID   Infusions:  cefTRIAXone  (ROCEPHIN )  IV 200 mL/hr at 04/04/24 1645   DOBUTamine  2.5 mcg/kg/min (04/05/24 0733)   PRN Medications: acetaminophen  **OR** acetaminophen , ipratropium-albuterol , polyethylene glycol, sodium chloride , sodium chloride  flush, sodium chloride  flush  Patient Profile   [name]  is a 63 year old with a history of  PAF,  previous smoker quit 2015  years ago, hypertension, previous small cell lung cancer  treated with chemo, chest XRT and prophylactic brain radiation in 2015, COPD, renal infarct 2022, CAD s/p CABG, DES ostial ramus into distal left main,  and chronic systolic HF EF ~25%. Admitted with A/C respiratory failure 2/2 A/C HFrEF>>cardiogenic shock and COPDE.  Assessment/Plan   Acute on chronic systolic heart failure w/ cardiogenic shock - EF has been 20-25% for the last 5 years.  - S/p Medtronic ICD and Barostim - Echo 1/25: EF < 20%, mod to severely reduced RV (in setting of RSV) - NYHA IV. Functional class confounded by deconditioning and COPD.  - Dobutamine  reduced to 2.5 on 4/25, coox 56 this morning, calculated fick 2.2/1.7. Will restart oral diuretics and continue 1 additional day -  Start torsemide  100mg  BID - GDMT limited by need for BP support with midodrine . Currently off midodrine  with stable BP. - Continue Farxiga  10 mg daily.  - Continue digoxin  0.125 mg daily.  - Continue spironolactone  25 mg daily.  - Continue home ivabradine  7.5 mg BID - She is likely end-stage HF +/- COPD. Has not tolerate maximal dose diuretics in the outpatient and requiring frequent admissions for hypoxia and exacerbation. Palliative Care has been consulted to further discuss GOC. Code status is DNR/DNI.   Acute on chronic hypoxic respiratory failure - Hx frequent admissions for AE COPD + A/C HFrEF.  - Stable off BiPAP. Improving. On IV ceftriaxone , nebs continued, steroids stopped   CKD stage IIIb - Creatinine baseline ~ 1.4-1.6  - Scr now 1.58, improved - Avoid hypotension   PAF - Improved heart rate on ivabradine , regular on exam today.  - Continue Eliquis  2.5 mg bid. Reduced dose (weight/creatinine).   CAD - History of CABG x 2 2019 - s/p DES ostial ramus extending to left main (2020) - No chest pain.  - Continue atorvastatin  80 mg daily - No ASA with need for AC   H/o SCLC - completed treatment 2015 - CT chest (5/24) soft tissue thickening along mediastinum but no discrete mass    H/o renal infarct 22' - On Eliquis . No bleeding issues.    GOC/FTT - She has had a significant functional decline in the setting of multiple end-stage disease processes. She would benefit from continuing goals of care conversations.  - She is DNR/DNI  Length of Stay: 4  [name], MD  04/05/2024, 12:20 PM  Advanced Heart Failure Team Pager [phone] (M-F; 7a - 5p)  Please contact [facility] for night-coverage after hours (5p -7a ) and weekends on [email or web address]

## 2024-04-05 NOTE — Progress Notes (Signed)
 PROGRESS NOTE    Brittany [name]   QIO:[id number] DOB: [birth year] DOA: 04/01/2024 PCP: Abraham Abo, MD  62/F with severe COPD, end-stage cardiomyopathy, chronic systolic CHF, severe protein calorie malnutrition, CAD/CABG, former smoker, hypertension, prior history of small cell lung cancer frequent hospitalizations for heart failure and or COPD, presented to the ED 4/22 with worsening dyspnea, labs noted BNP 4K, potassium 3.3, creatinine 1.8, chest x-ray with pulmonary edema.  Admitted to the ICU, placed on BiPAP, steroids and diuretics, also started on low-dose dobutamine . - 4/24,-continue diuretics, steroids, dobutamine , O2 weaned down to 2.5 L, palliative care meeting -4/25, Lasix  drip discontinued, or dobutamine  weaned down to 2.5 mcg   Subjective: -Frustrated, does not want to be asked a lot of questions about her health  Assessment and Plan:  Acute on chronic systolic CHF with cardiogenic shock Biventricular failure -Last echo 1/25 with EF less than 20%, moderate to severely reduced RV -Started on dobutamine  drip this admission with Lasix  gtt., volume status has improved, dobutamine  weaned down to 2.5 mg yesterday -Off midodrine  as well, Co. ox down to 54 -Continue Farxiga , digoxin , Aldactone , ivabradine  -Possibly restart oral diuretics today - Palliative following on account of end-stage heart failure, advanced COPD and malnutrition, does not appear to be open to hospice  Advanced COPD with exacerbation -Off continuous BiPAP -Wean O2 -Oral steroids discontinued yesterday in ICU -Day 5 of doxycycline  today, will discontinue, day 4/5 of ceftriaxone  -Continue Brovana , budesonide , Yupelri , transition back to Trelegy Ellipta  DC with as needed DuoNebs    CKD stage IIIb - Baseline creatinine 1.4-1.6, slightly higher not on admission, now stabilizing, avoid hypotension   History of PAF -Continue Eliquis , in sinus tach this morning   CAD/CABG in 2019  DES to ostial ramus  extending to left main 2020 - Continue statin and Eliquis    Hyperglycemia (exacerbated by systemic steroids) -Continue SSI, now improving   Remote history of small cell lung cancer  -treated with chemo and XRT  -Given how advanced her heart failure is no follow-up recommended as she would be a poor candidate for additional therapy   History of renal infarct -Likely related to A-fib, continue Eliquis   Severe protein calorie malnutrition Adult failure to thrive -Most appropriate for hospice, palliative care following   DVT prophylaxis: Eliquis  Code Status: DNR Family Communication: None present Disposition Plan: Home likely 48 hours  Consultants:    Procedures:   Antimicrobials:    Objective: Vitals:   04/04/24 2150 04/05/24 0046 04/05/24 0537 04/05/24 0841  BP:  110/68 100/63   Pulse:  100 100   Resp:  18 18   Temp:  99 F (37.2 C) 98.6 F (37 C)   TempSrc:  Oral Oral   SpO2: 96% 99% 96% 99%  Weight:   43 kg   Height:        Intake/Output Summary (Last 24 hours) at 04/05/2024 1014 Last data filed at 04/04/2024 1645 Gross per 24 hour  Intake 63.13 ml  Output 1100 ml  Net -1036.87 ml   Filed Weights   04/02/24 0349 04/04/24 0440 04/05/24 0537  Weight: 47.4 kg 42.2 kg 43 kg    Examination:  General exam: Appears calm and comfortable chronically ill, cachectic Respiratory system: Clear to auscultation Cardiovascular system: S1 & S2 heard, RRR.  Abd: nondistended, soft and nontender.Normal bowel sounds heard. Central nervous system: Alert and oriented. No focal neurological deficits. Extremities: no edema Skin: No rashes Psychiatry:  Mood & affect appropriate.     Data Reviewed:  CBC: Recent Labs  Lab 04/01/24 0618 04/01/24 0619 04/01/24 0625 04/02/24 0333 04/03/24 1806  WBC  --   --  13.5* 10.3 11.3*  NEUTROABS  --   --  9.9*  --   --   HGB 13.9 14.6 13.1 10.6* 12.8  HCT 41.0 43.0 42.7 32.8* 39.9  MCV  --   --  94.9 90.6 91.1  PLT  --    --  247 185 219   Basic Metabolic Panel: Recent Labs  Lab 04/02/24 0333 04/03/24 0933 04/03/24 1930 04/04/24 0454 04/05/24 0500  NA 136 138 139 133* 134*  K 3.9 3.4* 4.5 3.5 3.5  CL 103 96* 94* 87* 92*  CO2 23 28 29 29 30   GLUCOSE 282* 152* 74 176* 133*  BUN 31* 39* 43* 53* 65*  CREATININE 1.61* 1.61* 1.49* 1.69* 1.58*  CALCIUM  9.4 9.7 11.0* 10.8* 10.1  MG 2.4  --   --  2.4 2.2   GFR: Estimated Creatinine Clearance: 24.7 mL/min (A) (by C-G formula based on SCr of 1.58 mg/dL (H)). Liver Function Tests: Recent Labs  Lab 04/02/24 0333  AST 25  ALT 16  ALKPHOS 61  BILITOT 0.8  PROT 6.7  ALBUMIN  3.5   No results for input(s): "LIPASE", "AMYLASE" in the last 168 hours. No results for input(s): "AMMONIA" in the last 168 hours. Coagulation Profile: Recent Labs  Lab 04/02/24 0333  INR 1.5*   Cardiac Enzymes: No results for input(s): "CKTOTAL", "CKMB", "CKMBINDEX", "TROPONINI" in the last 168 hours. BNP (last 3 results) No results for input(s): "PROBNP" in the last 8760 hours. HbA1C: No results for input(s): "HGBA1C" in the last 72 hours. CBG: Recent Labs  Lab 04/04/24 0604 04/04/24 1126 04/04/24 1550 04/04/24 2048 04/05/24 0546  GLUCAP 174* 184* 230* 273* 129*   Lipid Profile: No results for input(s): "CHOL", "HDL", "LDLCALC", "TRIG", "CHOLHDL", "LDLDIRECT" in the last 72 hours. Thyroid  Function Tests: No results for input(s): "TSH", "T4TOTAL", "FREET4", "T3FREE", "THYROIDAB" in the last 72 hours. Anemia Panel: No results for input(s): "VITAMINB12", "FOLATE", "FERRITIN", "TIBC", "IRON", "RETICCTPCT" in the last 72 hours. Urine analysis:    Component Value Date/Time   COLORURINE YELLOW 05/23/2023 1519   APPEARANCEUR CLEAR 05/23/2023 1519   LABSPEC 1.013 05/23/2023 1519   PHURINE 6.0 05/23/2023 1519   GLUCOSEU NEGATIVE 05/23/2023 1519   HGBUR NEGATIVE 05/23/2023 1519   BILIRUBINUR NEGATIVE 05/23/2023 1519   KETONESUR NEGATIVE 05/23/2023 1519   PROTEINUR  NEGATIVE 05/23/2023 1519   UROBILINOGEN 1.0 03/15/2015 1117   NITRITE NEGATIVE 05/23/2023 1519   LEUKOCYTESUR NEGATIVE 05/23/2023 1519   Sepsis Labs: @LABRCNTIP (procalcitonin:4,lacticidven:4)  ) Recent Results (from the past 240 hours)  Resp panel by RT-PCR (RSV, Flu A&B, Covid) Anterior Nasal Swab     Status: None   Collection Time: 04/01/24  6:23 AM   Specimen: Anterior Nasal Swab  Result Value Ref Range Status   SARS Coronavirus 2 by RT PCR NEGATIVE NEGATIVE Final   Influenza A by PCR NEGATIVE NEGATIVE Final   Influenza B by PCR NEGATIVE NEGATIVE Final    Comment: (NOTE) The Xpert Xpress SARS-CoV-2/FLU/RSV plus assay is intended as an aid in the diagnosis of influenza from Nasopharyngeal swab specimens and should not be used as a sole basis for treatment. Nasal washings and aspirates are unacceptable for Xpert Xpress SARS-CoV-2/FLU/RSV testing.  Fact Sheet for Patients: BloggerCourse.com  Fact Sheet for Healthcare Providers: SeriousBroker.it  This test is not yet approved or cleared by the [place]  FDA and has been  authorized for detection and/or diagnosis of SARS-CoV-2 by FDA under an Emergency Use Authorization (EUA). This EUA will remain in effect (meaning this test can be used) for the duration of the COVID-19 declaration under Section 564(b)(1) of the Act, 21 U.S.C. section 360bbb-3(b)(1), unless the authorization is terminated or revoked.     Resp Syncytial Virus by PCR NEGATIVE NEGATIVE Final    Comment: (NOTE) Fact Sheet for Patients: BloggerCourse.com  Fact Sheet for Healthcare Providers: SeriousBroker.it  This test is not yet approved or cleared by the [place]  FDA and has been authorized for detection and/or diagnosis of SARS-CoV-2 by FDA under an Emergency Use Authorization (EUA). This EUA will remain in effect (meaning this test can be used) for  the duration of the COVID-19 declaration under Section 564(b)(1) of the Act, 21 U.S.C. section 360bbb-3(b)(1), unless the authorization is terminated or revoked.  Performed at [facility], [address] [place], [place] [address]      Radiology Studies: No results found.   Scheduled Meds:  apixaban   2.5 mg Oral BID   arformoterol   15 mcg Nebulization BID   atorvastatin   80 mg Oral Daily   budesonide  (PULMICORT ) nebulizer solution  0.5 mg Nebulization BID   Chlorhexidine  Gluconate Cloth  6 each Topical Daily   dapagliflozin  propanediol  10 mg Oral Daily   digoxin   125 mcg Oral Daily   doxycycline   100 mg Oral Q12H   fluticasone   1 spray Each Nare Daily   insulin  aspart  0-20 Units Subcutaneous TID WC   insulin  aspart  0-5 Units Subcutaneous QHS   ivabradine   7.5 mg Oral BID WC   potassium chloride   40 mEq Oral Daily   revefenacin   175 mcg Nebulization Daily   sodium chloride  flush  10-40 mL Intracatheter Q12H   sodium chloride  flush  3 mL Intravenous Q12H   spironolactone   25 mg Oral Daily   Continuous Infusions:  cefTRIAXone  (ROCEPHIN )  IV 200 mL/hr at 04/04/24 1645   DOBUTamine  2.5 mcg/kg/min (04/05/24 0733)     LOS: 4 days    Time spent:    Deforest Fast, MD [facility]   04/05/2024, 10:14 AM

## 2024-04-05 NOTE — Plan of Care (Signed)
 Pt gets annoyed when staff comes in to do patient care. She is not verbal with her irritation but her facial expressions indicate her frustration

## 2024-04-06 DIAGNOSIS — N1832 Chronic kidney disease, stage 3b: Secondary | ICD-10-CM | POA: Diagnosis not present

## 2024-04-06 DIAGNOSIS — I5023 Acute on chronic systolic (congestive) heart failure: Secondary | ICD-10-CM | POA: Diagnosis not present

## 2024-04-06 DIAGNOSIS — J9601 Acute respiratory failure with hypoxia: Secondary | ICD-10-CM | POA: Diagnosis not present

## 2024-04-06 LAB — COOXEMETRY PANEL
Carboxyhemoglobin: 1.7 % — ABNORMAL HIGH (ref 0.5–1.5)
Methemoglobin: <0.7 % (ref 0.0–1.5)
O2 Saturation: 63.6 %
Total hemoglobin: 15.5 g/dL (ref 12.0–16.0)

## 2024-04-06 LAB — GLUCOSE, CAPILLARY
Glucose-Capillary: 110 mg/dL — ABNORMAL HIGH (ref 70–99)
Glucose-Capillary: 227 mg/dL — ABNORMAL HIGH (ref 70–99)
Glucose-Capillary: 83 mg/dL (ref 70–99)
Glucose-Capillary: 356 mg/dL — ABNORMAL HIGH (ref 70–99)

## 2024-04-06 LAB — CBC
HCT: 45.7 % (ref 36.0–46.0)
Hemoglobin: 15.3 g/dL — ABNORMAL HIGH (ref 12.0–15.0)
MCH: 29.1 pg (ref 26.0–34.0)
MCHC: 33.5 g/dL (ref 30.0–36.0)
MCV: 86.9 fL (ref 80.0–100.0)
Platelets: 178 10*3/uL (ref 150–400)
RBC: 5.26 MIL/uL — ABNORMAL HIGH (ref 3.87–5.11)
RDW: 17.9 % — ABNORMAL HIGH (ref 11.5–15.5)
WBC: 10.5 10*3/uL (ref 4.0–10.5)
nRBC: 0 % (ref 0.0–0.2)

## 2024-04-06 LAB — MAGNESIUM: Magnesium: 2 mg/dL (ref 1.7–2.4)

## 2024-04-06 LAB — BASIC METABOLIC PANEL WITH GFR
Anion gap: 15 (ref 5–15)
BUN: 60 mg/dL — ABNORMAL HIGH (ref 8–23)
CO2: 28 mmol/L (ref 22–32)
Calcium: 9.7 mg/dL (ref 8.9–10.3)
Chloride: 90 mmol/L — ABNORMAL LOW (ref 98–111)
Creatinine, Ser: 1.44 mg/dL — ABNORMAL HIGH (ref 0.44–1.00)
GFR, Estimated: 41 mL/min — ABNORMAL LOW (ref >60)
Glucose, Bld: 122 mg/dL — ABNORMAL HIGH (ref 70–99)
Potassium: 2.9 mmol/L — ABNORMAL LOW (ref 3.5–5.1)
Sodium: 133 mmol/L — ABNORMAL LOW (ref 135–145)

## 2024-04-06 MED ORDER — ONDANSETRON HCL 4 MG/2ML IJ SOLN
4.0000 mg | Freq: Once | INTRAMUSCULAR | Status: DC
Start: 2024-04-06 — End: 2024-04-07

## 2024-04-06 MED ORDER — POTASSIUM CHLORIDE 20 MEQ PO PACK
40.0000 meq | PACK | ORAL | Status: AC
  Administered 2024-04-06 (×3): 40 meq via ORAL
  Filled 2024-04-06 (×3): qty 2

## 2024-04-06 MED ORDER — POTASSIUM CHLORIDE CRYS ER 20 MEQ PO TBCR
40.0000 meq | EXTENDED_RELEASE_TABLET | ORAL | Status: DC
  Filled 2024-04-06: qty 2

## 2024-04-06 MED ORDER — HYDROCODONE-ACETAMINOPHEN 5-325 MG PO TABS
1.0000 | ORAL_TABLET | Freq: Four times a day (QID) | ORAL | Status: DC | PRN
  Administered 2024-04-06: 1 via ORAL
  Filled 2024-04-06: qty 1

## 2024-04-06 MED ORDER — SPIRONOLACTONE 25 MG PO TABS
50.0000 mg | ORAL_TABLET | Freq: Every day | ORAL | Status: DC
  Administered 2024-04-06 – 2024-04-07 (×2): 50 mg via ORAL
  Filled 2024-04-06 (×2): qty 2

## 2024-04-06 NOTE — Progress Notes (Signed)
 PROGRESS NOTE    Brittany [name]   GNF:[id number] DOB: [birth year] DOA: 04/01/2024 PCP: Abraham Abo, MD  62/F with severe COPD, end-stage cardiomyopathy, chronic systolic CHF, severe protein calorie malnutrition, CAD/CABG, former smoker, hypertension, prior history of small cell lung cancer frequent hospitalizations for heart failure and or COPD, presented to the ED 4/22 with worsening dyspnea, labs noted BNP 4K, potassium 3.3, creatinine 1.8, chest x-ray with pulmonary edema.  Admitted to the ICU, placed on BiPAP, steroids and diuretics, also started on low-dose dobutamine . - 4/24,-continue diuretics, steroids, dobutamine , O2 weaned down to 2.5 L, palliative care meeting -4/25, Lasix  drip discontinued, or dobutamine  weaned down to 2.5 mcg - 4/26, started on oral torsemide    Subjective: - Feels fair, no events overnight, off O2 this morning  Assessment and Plan:  Acute on chronic systolic CHF with cardiogenic shock Biventricular failure -Last echo 1/25 with EF less than 20%, moderate to severely reduced RV -Started on dobutamine  drip this admission with Lasix  gtt., volume status has improved, dobutamine  weaned down to 2.5 mg 4/25 -Off midodrine  as well, Co. ox improving, 64 this morning -Continue torsemide , Farxiga , digoxin , Aldactone , ivabradine  - Likely DC dobutamine  today - Palliative following on account of end-stage heart failure, advanced COPD and malnutrition, does not appear to be open to hospice - Possibly home tomorrow, remains at high risk of quick readmission  Advanced COPD with exacerbation -Off continuous BiPAP -Wean O2 -Oral steroids discontinued for 25 in ICU - Completed 5 days of doxycycline , will complete 5 days of ceftriaxone  today -Continue Brovana , budesonide , Yupelri , transition back to Trelegy Ellipta  at DC with as needed DuoNebs    CKD stage IIIb - Baseline creatinine 1.4-1.6, slightly higher not on admission, now stabilizing, avoid  hypotension  Severe hypokalemia Replete   History of PAF -Continue Eliquis , in sinus tach this morning   CAD/CABG in 2019  DES to ostial ramus extending to left main 2020 - Continue statin and Eliquis    Hyperglycemia (exacerbated by systemic steroids) -Continue SSI, now improving   Remote history of small cell lung cancer  -treated with chemo and XRT  -Given how advanced her heart failure is no follow-up recommended as she would be a poor candidate for additional therapy   History of renal infarct -Likely related to A-fib, continue Eliquis   Severe protein calorie malnutrition Adult failure to thrive -Most appropriate for hospice, palliative care following   DVT prophylaxis: Eliquis  Code Status: DNR Family Communication: None present Disposition Plan: Home likely 1 to 2 days  Consultants:    Procedures:   Antimicrobials:    Objective: Vitals:   04/06/24 0158 04/06/24 0432 04/06/24 0743 04/06/24 0752  BP: 90/66 92/64 99/69    Pulse: (!) 111 (!) 110 (!) 110   Resp: 19 19 17    Temp: 98.6 F (37 C) 98.1 F (36.7 C) 98.2 F (36.8 C)   TempSrc:  Oral Oral   SpO2: 97% 98% 96% 99%  Weight:  43.2 kg    Height:        Intake/Output Summary (Last 24 hours) at 04/06/2024 1105 Last data filed at 04/06/2024 0441 Gross per 24 hour  Intake 250 ml  Output 2550 ml  Net -2300 ml   Filed Weights   04/04/24 0440 04/05/24 0537 04/06/24 0432  Weight: 42.2 kg 43 kg 43.2 kg    Examination:  General exam: Frail chronically ill female laying in bed, AAO x 3 HEENT: No JVD CVS: S1-S2, regular rhythm Lungs: Distant breath sounds Abdomen: Soft, nontender, bowel sounds present  Extremities: No edema Skin: No rashes Psychiatry:  Mood & affect appropriate.     Data Reviewed:   CBC: Recent Labs  Lab 04/01/24 0619 04/01/24 0625 04/02/24 0333 04/03/24 1806 04/06/24 0550  WBC  --  13.5* 10.3 11.3* 10.5  NEUTROABS  --  9.9*  --   --   --   HGB 14.6 13.1 10.6* 12.8  15.3*  HCT 43.0 42.7 32.8* 39.9 45.7  MCV  --  94.9 90.6 91.1 86.9  PLT  --  247 185 219 178   Basic Metabolic Panel: Recent Labs  Lab 04/02/24 0333 04/03/24 0933 04/03/24 1930 04/04/24 0454 04/05/24 0500 04/06/24 0550  NA 136 138 139 133* 134* 133*  K 3.9 3.4* 4.5 3.5 3.5 2.9*  CL 103 96* 94* 87* 92* 90*  CO2 23 28 29 29 30 28   GLUCOSE 282* 152* 74 176* 133* 122*  BUN 31* 39* 43* 53* 65* 60*  CREATININE 1.61* 1.61* 1.49* 1.69* 1.58* 1.44*  CALCIUM  9.4 9.7 11.0* 10.8* 10.1 9.7  MG 2.4  --   --  2.4 2.2 2.0   GFR: Estimated Creatinine Clearance: 27.3 mL/min (A) (by C-G formula based on SCr of 1.44 mg/dL (H)). Liver Function Tests: Recent Labs  Lab 04/02/24 0333  AST 25  ALT 16  ALKPHOS 61  BILITOT 0.8  PROT 6.7  ALBUMIN  3.5   No results for input(s): "LIPASE", "AMYLASE" in the last 168 hours. No results for input(s): "AMMONIA" in the last 168 hours. Coagulation Profile: Recent Labs  Lab 04/02/24 0333  INR 1.5*   Cardiac Enzymes: No results for input(s): "CKTOTAL", "CKMB", "CKMBINDEX", "TROPONINI" in the last 168 hours. BNP (last 3 results) No results for input(s): "PROBNP" in the last 8760 hours. HbA1C: No results for input(s): "HGBA1C" in the last 72 hours. CBG: Recent Labs  Lab 04/05/24 0546 04/05/24 1117 04/05/24 1615 04/05/24 2044 04/06/24 0547  GLUCAP 129* 206* 138* 259* 110*   Lipid Profile: No results for input(s): "CHOL", "HDL", "LDLCALC", "TRIG", "CHOLHDL", "LDLDIRECT" in the last 72 hours. Thyroid  Function Tests: No results for input(s): "TSH", "T4TOTAL", "FREET4", "T3FREE", "THYROIDAB" in the last 72 hours. Anemia Panel: No results for input(s): "VITAMINB12", "FOLATE", "FERRITIN", "TIBC", "IRON", "RETICCTPCT" in the last 72 hours. Urine analysis:    Component Value Date/Time   COLORURINE YELLOW 05/23/2023 1519   APPEARANCEUR CLEAR 05/23/2023 1519   LABSPEC 1.013 05/23/2023 1519   PHURINE 6.0 05/23/2023 1519   GLUCOSEU NEGATIVE  05/23/2023 1519   HGBUR NEGATIVE 05/23/2023 1519   BILIRUBINUR NEGATIVE 05/23/2023 1519   KETONESUR NEGATIVE 05/23/2023 1519   PROTEINUR NEGATIVE 05/23/2023 1519   UROBILINOGEN 1.0 03/15/2015 1117   NITRITE NEGATIVE 05/23/2023 1519   LEUKOCYTESUR NEGATIVE 05/23/2023 1519   Sepsis Labs: @LABRCNTIP (procalcitonin:4,lacticidven:4)  ) Recent Results (from the past 240 hours)  Resp panel by RT-PCR (RSV, Flu A&B, Covid) Anterior Nasal Swab     Status: None   Collection Time: 04/01/24  6:23 AM   Specimen: Anterior Nasal Swab  Result Value Ref Range Status   SARS Coronavirus 2 by RT PCR NEGATIVE NEGATIVE Final   Influenza A by PCR NEGATIVE NEGATIVE Final   Influenza B by PCR NEGATIVE NEGATIVE Final    Comment: (NOTE) The Xpert Xpress SARS-CoV-2/FLU/RSV plus assay is intended as an aid in the diagnosis of influenza from Nasopharyngeal swab specimens and should not be used as a sole basis for treatment. Nasal washings and aspirates are unacceptable for Xpert Xpress SARS-CoV-2/FLU/RSV testing.  Fact Sheet for Patients:  BloggerCourse.com  Fact Sheet for Healthcare Providers: SeriousBroker.it  This test is not yet approved or cleared by the [place]  FDA and has been authorized for detection and/or diagnosis of SARS-CoV-2 by FDA under an Emergency Use Authorization (EUA). This EUA will remain in effect (meaning this test can be used) for the duration of the COVID-19 declaration under Section 564(b)(1) of the Act, 21 U.S.C. section 360bbb-3(b)(1), unless the authorization is terminated or revoked.     Resp Syncytial Virus by PCR NEGATIVE NEGATIVE Final    Comment: (NOTE) Fact Sheet for Patients: BloggerCourse.com  Fact Sheet for Healthcare Providers: SeriousBroker.it  This test is not yet approved or cleared by the [place]  FDA and has been authorized for detection and/or  diagnosis of SARS-CoV-2 by FDA under an Emergency Use Authorization (EUA). This EUA will remain in effect (meaning this test can be used) for the duration of the COVID-19 declaration under Section 564(b)(1) of the Act, 21 U.S.C. section 360bbb-3(b)(1), unless the authorization is terminated or revoked.  Performed at [facility], [address] [place], [place] [address]      Radiology Studies: No results found.   Scheduled Meds:  apixaban   2.5 mg Oral BID   arformoterol   15 mcg Nebulization BID   atorvastatin   80 mg Oral Daily   budesonide  (PULMICORT ) nebulizer solution  0.5 mg Nebulization BID   Chlorhexidine  Gluconate Cloth  6 each Topical Daily   dapagliflozin  propanediol  10 mg Oral Daily   digoxin   125 mcg Oral Daily   fluticasone   1 spray Each Nare Daily   insulin  aspart  0-20 Units Subcutaneous TID WC   insulin  aspart  0-5 Units Subcutaneous QHS   ivabradine   7.5 mg Oral BID WC   potassium chloride   40 mEq Oral Daily   potassium chloride   40 mEq Oral Q3H   revefenacin   175 mcg Nebulization Daily   sodium chloride  flush  10-40 mL Intracatheter Q12H   sodium chloride  flush  3 mL Intravenous Q12H   spironolactone   50 mg Oral Daily   torsemide   100 mg Oral BID   Continuous Infusions:  cefTRIAXone  (ROCEPHIN )  IV 2 g (04/05/24 1639)     LOS: 5 days    Time spent:    Deforest Fast, MD [facility]   04/06/2024, 11:05 AM

## 2024-04-06 NOTE — Progress Notes (Signed)
 Advanced Heart Failure Rounding Note  Cardiologist: Dr. Julane Ny  Chief Complaint: Acute on chronic systolic CHF/cardiogenic shock  Subjective:    Coox 63 this morning, dobutamine  turned off this morning. Hypokalemia from torsemide  dosing. Aggressive repletion ordered. Will assess tomorrow AM and likely discharge.   Objective:    Weight Range: 43.2 kg Body mass index is 18.91 kg/m.   Vital Signs:   Temp:  [97.9 F (36.6 C)-98.6 F (37 C)] 98.2 F (36.8 C) (04/27 0743) Pulse Rate:  [96-111] 110 (04/27 0743) Resp:  [17-20] 17 (04/27 0743) BP: (90-109)/(61-69) 99/69 (04/27 0743) SpO2:  [96 %-99 %] 99 % (04/27 0752) Weight:  [43.2 kg] 43.2 kg (04/27 0432) Last BM Date : 03/31/24  Weight change: Filed Weights   04/04/24 0440 04/05/24 0537 04/06/24 0432  Weight: 42.2 kg 43 kg 43.2 kg   Intake/Output:  Intake/Output Summary (Last 24 hours) at 04/06/2024 1257 Last data filed at 04/06/2024 0441 Gross per 24 hour  Intake 250 ml  Output 2550 ml  Net -2300 ml    Physical Exam  General:  frail, chronically ill appearing.   Cor: Tachycardiac, systolic murmur, JVP not visible sitting up Lungs: Mild bilateral rhonchi Extremities: no cyanosis, clubbing, rash, edema  Neuro: alert & oriented x 3. Moves all 4 extremities w/o difficulty. Affect pleasant.  Telemetry   ST 100s (Personally reviewed)     Labs    CBC Recent Labs    04/03/24 1806 04/06/24 0550  WBC 11.3* 10.5  HGB 12.8 15.3*  HCT 39.9 45.7  MCV 91.1 86.9  PLT 219 178   Basic Metabolic Panel Recent Labs    47/82/95 0500 04/06/24 0550  NA 134* 133*  K 3.5 2.9*  CL 92* 90*  CO2 30 28  GLUCOSE 133* 122*  BUN 65* 60*  CREATININE 1.58* 1.44*  CALCIUM  10.1 9.7  MG 2.2 2.0   BNP: BNP (last 3 results) Recent Labs    02/14/24 2132 03/13/24 1127 04/01/24 0625  BNP 2,541.1* 1,892.9* 4,049.6*    Medications:    Scheduled Medications:  apixaban   2.5 mg Oral BID   arformoterol   15 mcg  Nebulization BID   atorvastatin   80 mg Oral Daily   budesonide  (PULMICORT ) nebulizer solution  0.5 mg Nebulization BID   Chlorhexidine  Gluconate Cloth  6 each Topical Daily   dapagliflozin  propanediol  10 mg Oral Daily   digoxin   125 mcg Oral Daily   fluticasone   1 spray Each Nare Daily   insulin  aspart  0-20 Units Subcutaneous TID WC   insulin  aspart  0-5 Units Subcutaneous QHS   ivabradine   7.5 mg Oral BID WC   potassium chloride   40 mEq Oral Daily   potassium chloride   40 mEq Oral Q3H   revefenacin   175 mcg Nebulization Daily   sodium chloride  flush  10-40 mL Intracatheter Q12H   sodium chloride  flush  3 mL Intravenous Q12H   spironolactone   50 mg Oral Daily   torsemide   100 mg Oral BID   Infusions:   PRN Medications: acetaminophen  **OR** acetaminophen , HYDROcodone -acetaminophen , ipratropium-albuterol , polyethylene glycol, sodium chloride , sodium chloride  flush, sodium chloride  flush  Patient Profile   Brittany [name]  is a 63 year old with a history of  PAF,  previous smoker quit 2015  years ago, hypertension, previous small cell lung cancer treated with chemo, chest XRT and prophylactic brain radiation in 2015, COPD, renal infarct 2022, CAD s/p CABG, DES ostial ramus into distal left main,  and chronic  systolic HF EF ~25%. Admitted with A/C respiratory failure 2/2 A/C HFrEF>>cardiogenic shock and COPDE.  Assessment/Plan   Acute on chronic systolic heart failure w/ cardiogenic shock - EF has been 20-25% for the last 5 years.  - S/p Medtronic ICD and Barostim - Echo 1/25: EF < 20%, mod to severely reduced RV (in setting of RSV) - NYHA IV. Functional class confounded by deconditioning and COPD.  - Dobutamine  off this morning, coox 63, weight stable - Continue torsemide  100mg  BID - GDMT limited by need for BP support with midodrine . Currently off midodrine  with stable BP. - Continue Farxiga  10 mg daily.  - Continue digoxin  0.125 mg daily.  - Continue spironolactone  25 mg  daily.  - Continue home ivabradine  7.5 mg BID - She is end-stage HF +/- COPD. Has not tolerate maximal dose diuretics in the outpatient and requiring frequent admissions for hypoxia and exacerbation. Palliative Care has been consulted to further discuss GOC. Code status is DNR/DNI. Continue full scope of care otherwise, not yet ready for hospice - Plan for discharge tomorrow   Acute on chronic hypoxic respiratory failure - Hx frequent admissions for AE COPD + A/C HFrEF.  - Stable off BiPAP. Improving. On IV ceftriaxone , nebs continued, steroids stopped   CKD stage IIIb, hypokalemia - Creatinine baseline ~ 1.4-1.6  - Scr now 1.4, improved - Avoid hypotension - Replete electrolytes aggressively, supplementation at discharge   PAF - Improved heart rate on ivabradine , regular on exam today.  - Continue Eliquis  2.5 mg bid. Reduced dose (weight/creatinine).   CAD - History of CABG x 2 2019 - s/p DES ostial ramus extending to left main (2020) - No chest pain.  - Continue atorvastatin  80 mg daily - No ASA with need for [facility]   H/o SCLC - completed treatment 2015 - CT chest (5/24) soft tissue thickening along mediastinum but no discrete mass    H/o renal infarct 22' - On Eliquis . No bleeding issues.    GOC/FTT - She has had a significant functional decline in the setting of multiple end-stage disease processes. She would benefit from continuing goals of care conversations.  - She is DNR/DNI  Length of Stay: 5  [name], MD  04/06/2024, 12:57 PM  Advanced Heart Failure Team Pager [phone] (M-F; 7a - 5p)  Please contact [facility] for night-coverage after hours (5p -7a ) and weekends on [email or web address]

## 2024-04-06 NOTE — Plan of Care (Signed)
  Problem: Pain Managment: Goal: General experience of comfort will improve and/or be controlled Outcome: Progressing   Problem: Safety: Goal: Ability to remain free from injury will improve Outcome: Progressing

## 2024-04-07 ENCOUNTER — Other Ambulatory Visit (HOSPITAL_COMMUNITY): Payer: Self-pay

## 2024-04-07 ENCOUNTER — Encounter

## 2024-04-07 DIAGNOSIS — R570 Cardiogenic shock: Secondary | ICD-10-CM

## 2024-04-07 DIAGNOSIS — I5022 Chronic systolic (congestive) heart failure: Secondary | ICD-10-CM

## 2024-04-07 DIAGNOSIS — I5023 Acute on chronic systolic (congestive) heart failure: Secondary | ICD-10-CM | POA: Diagnosis not present

## 2024-04-07 LAB — BASIC METABOLIC PANEL WITH GFR
Anion gap: 16 — ABNORMAL HIGH (ref 5–15)
BUN: 60 mg/dL — ABNORMAL HIGH (ref 8–23)
CO2: 24 mmol/L (ref 22–32)
Calcium: 9.9 mg/dL (ref 8.9–10.3)
Chloride: 95 mmol/L — ABNORMAL LOW (ref 98–111)
Creatinine, Ser: 1.7 mg/dL — ABNORMAL HIGH (ref 0.44–1.00)
GFR, Estimated: 33 mL/min — ABNORMAL LOW (ref >60)
Glucose, Bld: 123 mg/dL — ABNORMAL HIGH (ref 70–99)
Potassium: 4.3 mmol/L (ref 3.5–5.1)
Sodium: 135 mmol/L (ref 135–145)

## 2024-04-07 LAB — GLUCOSE, CAPILLARY: Glucose-Capillary: 129 mg/dL — ABNORMAL HIGH (ref 70–99)

## 2024-04-07 LAB — MAGNESIUM: Magnesium: 2.2 mg/dL (ref 1.7–2.4)

## 2024-04-07 LAB — COOXEMETRY PANEL
Carboxyhemoglobin: 1.3 % (ref 0.5–1.5)
Methemoglobin: <0.7 % (ref 0.0–1.5)
O2 Saturation: 64.5 %
Total hemoglobin: 15.2 g/dL (ref 12.0–16.0)

## 2024-04-07 MED ORDER — DIGOXIN 125 MCG PO TABS
125.0000 ug | ORAL_TABLET | Freq: Every evening | ORAL | 3 refills | Status: DC
  Filled 2024-04-07: qty 90, 90d supply, fill #0

## 2024-04-07 MED ORDER — DAPAGLIFLOZIN PROPANEDIOL 10 MG PO TABS
10.0000 mg | ORAL_TABLET | Freq: Every day | ORAL | 3 refills | Status: DC
  Filled 2024-04-07: qty 30, 30d supply, fill #0

## 2024-04-07 MED ORDER — METOLAZONE 2.5 MG PO TABS
2.5000 mg | ORAL_TABLET | ORAL | 5 refills | Status: DC | PRN
  Filled 2024-04-07: qty 10, fill #0

## 2024-04-07 MED ORDER — POTASSIUM CHLORIDE 20 MEQ PO PACK
40.0000 meq | PACK | Freq: Every day | ORAL | 11 refills | Status: DC
  Filled 2024-04-07: qty 30, 15d supply, fill #0

## 2024-04-07 MED ORDER — SPIRONOLACTONE 50 MG PO TABS
50.0000 mg | ORAL_TABLET | Freq: Every day | ORAL | 3 refills | Status: DC
  Filled 2024-04-07: qty 90, 90d supply, fill #0

## 2024-04-07 MED ORDER — TORSEMIDE 100 MG PO TABS
100.0000 mg | ORAL_TABLET | Freq: Two times a day (BID) | ORAL | 3 refills | Status: DC
  Filled 2024-04-07 (×2): qty 30, 15d supply, fill #0

## 2024-04-07 MED ORDER — METOLAZONE 2.5 MG PO TABS
2.5000 mg | ORAL_TABLET | ORAL | 5 refills | Status: DC | PRN
  Filled 2024-04-07: qty 10, 4d supply, fill #0

## 2024-04-07 NOTE — Progress Notes (Signed)
 Advanced Heart Failure Rounding Note  Cardiologist: Dr. Julane Ny Chief Complaint: Heart Failure Subjective:    Coox stable 64%, off DBA.  Anticipate discharge today.   Feeling well this morning. States she is ready to go home.   Objective:    Weight Range: 41.8 kg Body mass index is 18.3 kg/m.   Vital Signs:   Temp:  [98.2 F (36.8 C)-98.8 F (37.1 C)] 98.6 F (37 C) (04/28 0534) Pulse Rate:  [92-110] 101 (04/28 0534) Resp:  [17-18] 18 (04/28 0534) BP: (85-106)/(63-69) 101/67 (04/28 0652) SpO2:  [96 %-100 %] 99 % (04/28 0534) Weight:  [41.8 kg] 41.8 kg (04/28 0534) Last BM Date : 04/06/24  Weight change: Filed Weights   04/05/24 0537 04/06/24 0432 04/07/24 0534  Weight: 43 kg 43.2 kg 41.8 kg   Intake/Output:  Intake/Output Summary (Last 24 hours) at 04/07/2024 0705 Last data filed at 04/06/2024 2105 Gross per 24 hour  Intake 133 ml  Output --  Net 133 ml    Physical Exam   General: Frail appearing. No distress on RA Cardiac: JVP flat. S1 and S2 present. No murmurs or rub. Extremities: Warm and dry.  No peripheral edema.  Neuro: Alert and oriented x3. Affect pleasant. Lines/Devices:  RUE PICC  Telemetry   ST 90-100s (personally reviewed)  Labs    CBC Recent Labs    04/06/24 0550  WBC 10.5  HGB 15.3*  HCT 45.7  MCV 86.9  PLT 178   Basic Metabolic Panel Recent Labs    16/10/96 0550 04/07/24 0448  NA 133* 135  K 2.9* 4.3  CL 90* 95*  CO2 28 24  GLUCOSE 122* 123*  BUN 60* 60*  CREATININE 1.44* 1.70*  CALCIUM  9.7 9.9  MG 2.0 2.2   BNP: BNP (last 3 results) Recent Labs    02/14/24 2132 03/13/24 1127 04/01/24 0625  BNP 2,541.1* 1,892.9* 4,049.6*   Medications:    Scheduled Medications:  apixaban   2.5 mg Oral BID   arformoterol   15 mcg Nebulization BID   atorvastatin   80 mg Oral Daily   budesonide  (PULMICORT ) nebulizer solution  0.5 mg Nebulization BID   Chlorhexidine  Gluconate Cloth  6 each Topical Daily   dapagliflozin   propanediol  10 mg Oral Daily   digoxin   125 mcg Oral Daily   fluticasone   1 spray Each Nare Daily   insulin  aspart  0-20 Units Subcutaneous TID WC   insulin  aspart  0-5 Units Subcutaneous QHS   ivabradine   7.5 mg Oral BID WC   ondansetron  (ZOFRAN ) IV  4 mg Intravenous Once   potassium chloride   40 mEq Oral Daily   revefenacin   175 mcg Nebulization Daily   sodium chloride  flush  10-40 mL Intracatheter Q12H   sodium chloride  flush  3 mL Intravenous Q12H   spironolactone   50 mg Oral Daily   torsemide   100 mg Oral BID   Infusions:   PRN Medications: acetaminophen  **OR** acetaminophen , HYDROcodone -acetaminophen , ipratropium-albuterol , polyethylene glycol, sodium chloride , sodium chloride  flush, sodium chloride  flush  Patient Profile   Brittany [name]  is a 63 year old with a history of  PAF,  previous smoker quit 2015  years ago, hypertension, 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 Admitted with A/C respiratory failure 2/2 A/C HFrEF>>cardiogenic shock and COPDE.  Assessment/Plan   Acute on  chronic systolic heart failure w/ cardiogenic shock - EF has been 20-25% for the last 5 years.  - S/p Medtronic ICD and Barostim - Echo 1/25: EF < 20%, mod to severely reduced RV (in setting of RSV) - NYHA IV. Functional class confounded by deconditioning and COPD.  - Coox stable, 65% off DBA.  - Off midodrine . - Continue torsemide  100mg  BID + KCL 40 mEq. Metolazone  2.5 mg PRN weekly for additional volume.  - Continue Farxiga  10 mg daily.  - Continue digoxin  0.125 mg daily.  - Continue spironolactone  50 mg daily.  - Continue home ivabradine  7.5 mg BID - She is end-stage HF +/- COPD. Has not tolerate maximal dose diuretics in the outpatient and requiring frequent admissions for hypoxia and exacerbation. Palliative  Care has been consulted to further discuss GOC. Code status is DNR/DNI. Continue full scope of care otherwise, not yet ready for hospice   Acute on chronic hypoxic respiratory failure - Hx frequent admissions for AE COPD + A/C HFrEF.  - Stable off BiPAP. Improving. On IV ceftriaxone , nebs continued, steroids stopped   CKD stage IIIb, hypokalemia - Creatinine baseline ~ 1.4-1.6  - sCr 1.7 today, fluctuant - Avoid hypotension   PAF - Improved heart rate on ivabradine . - Continue Eliquis  2.5 mg bid. Reduced dose (weight/creatinine).   CAD - History of CABG x 2 2019 - s/p DES ostial ramus extending to left main (2020) - No chest pain.  - Continue atorvastatin  80 mg daily - No ASA with need for AC   H/o SCLC - completed treatment 2015 - CT chest (5/24) soft tissue thickening along mediastinum but no discrete mass    H/o renal infarct 22' - On Eliquis . No bleeding issues.    GOC/FTT - She has had a significant functional decline in the setting of multiple end-stage disease processes. She would benefit from continuing goals of care conversations.  - She is DNR/DNI  Heart failure team will sign off as of 04/07/24  HF Team Medication Recommendations for Home: Eliquis  2.5 mg bid Atorva 80 mg daily Farxiga  10 mg daily Digoxin  125 mcg daily Corlanor  7.5 mg daily KCL packets 40 mEq daily Spiro 50 mg daily Torsemide  100 mg bid (100 mg tablet) Metolazone  2.5 mg PRN as needed per HF instruction  Follow up as an outpatient: [facility] 5/9 with Dr. Julane Ny  Length of Stay: 6  [place] Sonji Starkes, NP  04/07/2024, 7:05 AM  Advanced Heart Failure Team Pager [phone] (M-F; 7a - 5p)  Please contact [facility] for night-coverage after hours (5p -7a ) and weekends on [email or web address]

## 2024-04-07 NOTE — Progress Notes (Signed)
 Per RN, Pt was in a hurry and left before meds obtained from [facility], I called in meds to her Home [facility] pharmacy too  Deforest Fast

## 2024-04-07 NOTE — Progress Notes (Signed)
 RA DL PICC removed per protocol per MD order. Manual pressure applied for 3 mins. Vaseline gauze, gauze, and Tegaderm applied over insertion site. No bleeding or swelling noted. Instructed patient to remain in bed for thirty mins. However, patient refused to stay despite explanation of increased risk for bleeding and risk for possible air embolism. Pt verbalized, "I am already paying my driver extra money for having to wait. I am not waiting any longer. I have had PICCs before and have not have any problems." Further educated patient about S/S of infection and when to call MD; no heavy lifting or pressure on right side for 24 hours; keep dressing dry and intact for 24 hours. Pt verbalized comprehension.

## 2024-04-07 NOTE — Care Management Important Message (Signed)
 Important Message  Patient Details  Name: Brittany [name]  MRN: [id number] Date of Birth: 03-23-1961   Important Message Given:  Yes - Medicare IM     Janith Melnick 04/07/2024, 10:27 AM

## 2024-04-07 NOTE — Discharge Summary (Addendum)
 Physician Discharge Summary  Brittany [name]  ZOX:[id number] DOB: 10/22/1961 DOA: 04/01/2024  PCP: Abraham Abo, MD  Admit date: 04/01/2024 Discharge date: 04/07/2024  Time spent: 45 minutes  Recommendations for Outpatient Follow-up:  CHF Dr.Bensimhon next week Very poor prognosis, high risk of quick readmission   Discharge Diagnoses:  Principal Problem:   Acute respiratory failure (HCC) End-stage heart failure, biventricular failure   COPD with acute exacerbation (HCC)   History of CAD (coronary artery disease)   Acute on chronic systolic CHF (congestive heart failure) (HCC)   Troponin level elevated   CKD stage 3b, GFR 30-44 ml/min (HCC)   Chronic systolic heart failure (HCC)   Essential hypertension   Type 2 diabetes mellitus with hyperlipidemia (HCC)   History of lung cancer   DNR   Discharge Condition: stable,   Diet recommendation: DM heart healthy  Filed Weights   04/05/24 0537 04/06/24 0432 04/07/24 0534  Weight: 43 kg 43.2 kg 41.8 kg    History of present illness:   63/F with severe COPD, end-stage cardiomyopathy, chronic systolic CHF, severe protein calorie malnutrition, CAD/CABG, former smoker, hypertension, prior history of small cell lung cancer frequent hospitalizations for heart failure and or COPD, presented to the ED 4/22 with worsening dyspnea, labs noted BNP 4K, potassium 3.3, creatinine 1.8, chest x-ray with pulmonary edema.  Admitted to the ICU, placed on BiPAP, steroids and diuretics, also started on low-dose dobutamine . - 4/24,-continue diuretics, steroids, dobutamine , O2 weaned down to 2.5 L, palliative care meeting -4/25, Lasix  drip discontinued, or dobutamine  weaned down to 2.5 mcg - 4/26, started on oral torsemide   Hospital Course:   Acute on chronic systolic CHF with cardiogenic shock Biventricular failure -Last echo 1/25 with EF less than 20%, moderate to severely reduced RV -Started on dobutamine  drip this admission with Lasix   gtt., volume status has improved,  -Off midodrine  as well -Continue torsemide , Farxiga , digoxin , Aldactone , ivabradine  - Dobutamine  discontinued yesterday - Palliative following on account of end-stage heart failure, advanced COPD and malnutrition, She is not open to hospice, remains DNR - Discharged home today, she adamantly declines outpatient palliative care remains at high risk of quick readmission   Advanced COPD with exacerbation -Off continuous BiPAP -Weaned O2 down to 2L, unfortunately she only uses this PRN at baseline -Oral steroids discontinued 4/25 in ICU - Completed 5 days of doxycycline  and ceftriaxone  - transition back to Trelegy Ellipta  at DC with as needed DuoNebs    CKD stage IIIb - Baseline creatinine 1.4-1.6, slightly higher on admission   Severe hypokalemia Replete   History of PAF -Continue Eliquis , in sinus tach this morning   CAD/CABG in 2019  DES to ostial ramus extending to left main 2020 - Continue statin and Eliquis    Hyperglycemia (exacerbated by systemic steroids) -continue farxiga    Remote history of small cell lung cancer  -treated with chemo and XRT  -Given how advanced her heart failure is no follow-up recommended as she would be a poor candidate for additional therapy   History of renal infarct -Likely related to A-fib, continue Eliquis    Severe protein calorie malnutrition Adult failure to thrive -Most appropriate for hospice, palliative care following  Discharge Exam: Vitals:   04/07/24 0652 04/07/24 0722  BP: 101/67 100/70  Pulse:  97  Resp:  20  Temp:  97.6 F (36.4 C)  SpO2:  99%   General exam: Frail chronically ill female laying in bed, AAO x 3 HEENT: No JVD CVS: S1-S2, regular rhythm Lungs: Distant breath sounds Abdomen:  Soft, nontender, bowel sounds present Extremities: No edema Skin: No rashes Psychiatry:  Mood & affect appropriate.     Discharge Instructions   Discharge Instructions     Amb Referral to  Palliative Care   Complete by: As directed       Allergies as of 04/07/2024       Reactions   Lactose Intolerance (gi) Diarrhea   Codeine Nausea And Vomiting        Medication List     STOP taking these medications    midodrine  5 MG tablet Commonly known as: PROAMATINE        TAKE these medications    albuterol  108 (90 Base) MCG/ACT inhaler Commonly known as: VENTOLIN  HFA Inhale 2 puffs into the lungs every 6 (six) hours as needed for wheezing or shortness of breath.   atorvastatin  80 MG tablet Commonly known as: LIPITOR  Take 1 tablet (80 mg total) by mouth daily.   cyclobenzaprine  10 MG tablet Commonly known as: FLEXERIL  Take 1 tablet (10 mg total) by mouth 3 (three) times daily as needed for muscle spasms. What changed:  how much to take when to take this   digoxin  0.125 MG tablet Commonly known as: LANOXIN  Take 1 tablet (125 mcg total) by mouth at bedtime.   Eliquis  2.5 MG Tabs tablet Generic drug: apixaban  TAKE 1 TABLET BY MOUTH TWICE A DAY   Ensure Original Liqd Take 1 Bottle by mouth 2 (two) times daily after a meal. Drink 1 bottle 2 times daily.   Farxiga  10 MG Tabs tablet Generic drug: dapagliflozin  propanediol Take 1 tablet (10 mg total) by mouth daily.   hydrOXYzine  25 MG capsule Commonly known as: VISTARIL  TAKE 1 CAPSULE (25 MG TOTAL) BY MOUTH DAILY AS NEEDED. What changed: reasons to take this   ipratropium-albuterol  0.5-2.5 (3) MG/3ML Soln Commonly known as: DUONEB TAKE 3 MLS BY NEBULIZATION EVERY 4 (FOUR) HOURS AS NEEDED (FOR SHORTNESS OF BREATH).   ivabradine  7.5 MG Tabs tablet Commonly known as: Corlanor  Take 1 tablet (7.5 mg total) by mouth 2 (two) times daily with a meal.   megestrol  20 MG tablet Commonly known as: MEGACE  Take 1 tablet (20 mg total) by mouth 2 (two) times daily.   metolazone  2.5 MG tablet Commonly known as: ZAROXOLYN  Take 1 tablet (2.5 mg total) by mouth as needed (as instructed by [facility] clinic). with  40 mEq of Potassium 3 times per day on metolazone  days What changed:  when to take this reasons to take this additional instructions   multivitamin with minerals Tabs tablet Take 1 tablet by mouth daily.   nitroGLYCERIN  0.4 MG SL tablet Commonly known as: NITROSTAT  Place 1 tablet (0.4 mg total) under the tongue every 5 (five) minutes as needed for chest pain.   potassium chloride  20 MEQ packet Commonly known as: KLOR-CON  Dissolve two (40mEq total) packets in 4-6oz of fluid and take by mouth daily but take it three times a day when taking Metolazone  2.5mg . What changed: additional instructions   spironolactone  50 MG tablet Commonly known as: ALDACTONE  Take 1 tablet (50 mg total) by mouth daily. What changed:  medication strength how much to take when to take this   torsemide  100 MG tablet Commonly known as: DEMADEX  Take 1 tablet (100 mg total) by mouth 2 (two) times daily. What changed: medication strength   Trelegy Ellipta  200-62.5-25 MCG/ACT Aepb Generic drug: Fluticasone -Umeclidin-Vilant Inhale 1 puff into the lungs daily.       Allergies  Allergen Reactions  Lactose Intolerance (Gi) Diarrhea   Codeine Nausea And Vomiting    Follow-up Information     Abraham Abo, MD Follow up in 1 week(s).   Specialty: Family Medicine Why: Hospital follow up scheduled for 04/10/2024 at 2 pm. please call to reschedule if you are unable to keep appt Contact information: [address] [facility] [address] [place] [address] [phone]                  The results of significant diagnostics from this hospitalization (including imaging, microbiology, ancillary and laboratory) are listed below for reference.    Significant Diagnostic Studies: ECHOCARDIOGRAM COMPLETE Result Date: 04/02/2024    ECHOCARDIOGRAM REPORT   Patient Name:   Brittany [name]  Date of Exam: 04/02/2024 Medical Rec #:  [id number]               Height:       59.5 in Accession #:    [id number]               Weight:       104.5 lb Date of Birth:  1961/07/23               BSA:          1.408 m Patient Age:    62 years                BP:           152/75 mmHg Patient Gender: F                       HR:           127 bpm. Exam Location:  Inpatient Procedure: 2D Echo, Cardiac Doppler, Color Doppler, 3D Echo and Strain Analysis            (Both Spectral and Color Flow Doppler were utilized during            procedure). Indications:    CHF  History:        Patient has prior history of Echocardiogram examinations, most                 recent 12/15/2023. CHF and HFrEF, CAD and Previous Myocardial                 Infarction, CKD3a and COPD; Risk Factors:Diabetes and Former                 Smoker.  Sonographer:    Adelia Homestead RVT Referring Phys: [id number] [facility] POKHREL  Sonographer Comments: Global longitudinal strain was attempted. IMPRESSIONS  1. Left ventricular ejection fraction, by estimation, is <20%. The left ventricle has severely decreased function. The left ventricle demonstrates regional wall motion abnormalities (see scoring diagram/findings for description). The left ventricular internal cavity size was severely dilated. Left ventricular diastolic parameters are consistent with Grade II diastolic dysfunction (pseudonormalization). Severe global hypokinesis. There is akinesis of the left ventricular, entire apical segment. There is akinesis of the left ventricular, mid-apical septal wall, inferior wall, lateral wall, anterior wall and inferolateral wall. There is akinesis of the left ventricular, basal-mid anteroseptal wall. The average left ventricular global longitudinal strain is -2.8 %. The global longitudinal strain is abnormal.  2. Right ventricular systolic function is normal. The right ventricular size is normal. Tricuspid regurgitation signal is inadequate for assessing PA pressure.  3. Left atrial size was mildly dilated.  4. The mitral valve is normal in structure. No evidence of mitral  valve  regurgitation. No evidence of mitral stenosis.  5. The aortic valve is tricuspid. There is moderate calcification of the aortic valve. There is moderate thickening of the aortic valve. Aortic valve regurgitation is severe. Aortic valve sclerosis/calcification is present, without any evidence of aortic stenosis. Aortic valve mean gradient measures 8.0 mmHg. Aortic valve Vmax measures 1.99 m/s.  6. The inferior vena cava is normal in size with <50% respiratory variability, suggesting right atrial pressure of 8 mmHg. FINDINGS  Left Ventricle: Left ventricular ejection fraction, by estimation, is <20%. The left ventricle has severely decreased function. The left ventricle demonstrates regional wall motion abnormalities. The average left ventricular global longitudinal strain is -2.8 %. Strain was performed and the global longitudinal strain is abnormal. 3D ejection fraction reviewed and evaluated as part of the interpretation. Alternate measurement of EF is felt to be most reflective of LV function. The left ventricular internal cavity size was severely dilated. There is no left ventricular hypertrophy. Left ventricular diastolic parameters are consistent with Grade II diastolic dysfunction (pseudonormalization). Normal left ventricular filling pressure. Right Ventricle: The right ventricular size is normal. No increase in right ventricular wall thickness. Right ventricular systolic function is normal. Tricuspid regurgitation signal is inadequate for assessing PA pressure. Left Atrium: Left atrial size was mildly dilated. Right Atrium: Right atrial size was normal in size. Pericardium: There is no evidence of pericardial effusion. Mitral Valve: The mitral valve is normal in structure. No evidence of mitral valve regurgitation. No evidence of mitral valve stenosis. Tricuspid Valve: The tricuspid valve is normal in structure. Tricuspid valve regurgitation is not demonstrated. No evidence of tricuspid stenosis. Aortic  Valve: The aortic valve is tricuspid. There is moderate calcification of the aortic valve. There is moderate thickening of the aortic valve. Aortic valve regurgitation is severe. Aortic valve sclerosis/calcification is present, without any evidence of aortic stenosis. Aortic valve mean gradient measures 8.0 mmHg. Aortic valve peak gradient measures 15.8 mmHg. Pulmonic Valve: The pulmonic valve was not well visualized. Pulmonic valve regurgitation is not visualized. No evidence of pulmonic stenosis. Aorta: The aortic root is normal in size and structure. Venous: The inferior vena cava is normal in size with less than 50% respiratory variability, suggesting right atrial pressure of 8 mmHg. IAS/Shunts: No atrial level shunt detected by color flow Doppler. Additional Comments: 3D was performed not requiring image post processing on an independent workstation and was indeterminate.  LEFT VENTRICLE PLAX 2D LVIDd:         5.90 cm   Diastology LVIDs:         5.30 cm   LV e' medial:    7.07 cm/s LV PW:         1.10 cm   LV E/e' medial:  12.1 LV IVS:        0.60 cm   LV e' lateral:   15.80 cm/s LVOT diam:     1.80 cm   LV E/e' lateral: 5.4 LVOT Area:     2.54 cm                          2D Longitudinal Strain                          2D Strain GLS (A4C):   -4.1 %  2D Strain GLS (A3C):   0.0 %                          2D Strain GLS (A2C):   -4.4 %                          2D Strain GLS Avg:     -2.8 %                           3D Volume EF:                          3D EF:        34 %                          LV EDV:       222 ml                          LV ESV:       146 ml                          LV SV:        76 ml IVC IVC diam: 1.90 cm LEFT ATRIUM             Index        RIGHT ATRIUM          Index LA diam:        4.40 cm 3.13 cm/m   RA Area:     6.52 cm LA Vol (A2C):   49.3 ml 35.02 ml/m  RA Volume:   10.20 ml 7.25 ml/m LA Vol (A4C):   46.2 ml 32.82 ml/m LA Biplane Vol: 54.9 ml 39.00 ml/m   AORTIC VALVE               PULMONIC VALVE AV Vmax:      199.00 cm/s  PV Vmax:       1.06 m/s AV Vmean:     130.000 cm/s PV Peak grad:  4.5 mmHg AV VTI:       0.245 m AV Peak Grad: 15.8 mmHg AV Mean Grad: 8.0 mmHg  AORTA Ao Root diam: 2.60 cm MITRAL VALVE MV Area (PHT): 7.70 cm    SHUNTS MV Decel Time: 99 msec     Systemic Diam: 1.80 cm MV E velocity: 85.40 cm/s MV A velocity: 58.30 cm/s MV E/A ratio:  1.46 Gaylyn Keas MD Electronically signed by Gaylyn Keas MD Signature Date/Time: 04/02/2024/1:25:36 PM    Final    DG CHEST PORT 1 VIEW Result Date: 04/02/2024 CLINICAL DATA:  PICC line placement EXAM: PORTABLE CHEST 1 VIEW COMPARISON:  April 01, 2024 FINDINGS: No change in the left subclavian bipolar ICDF pacemaker device tip of the leads in good position no evidence of discontinuity Stimulator device traveling through the right hemithorax into the right lower neck. Right PICC line tip in the SVC right atrium junction. Ill-defined basilar pulmonary infiltrates appear slightly improved since prior examination. No consolidations. COPD changes without significant pleural effusions Heart and mediastinum normal. Prior CABG. IMPRESSION: *Right PICC line tip in the SVC right atrium junction. *Ill-defined basilar pulmonary infiltrates appear slightly improved since prior examination. Electronically Signed  By: Fredrich Jefferson M.D.   On: 04/02/2024 11:36   US  EKG SITE RITE Result Date: 04/02/2024 If Site Rite image not attached, placement could not be confirmed due to current cardiac rhythm.  DG Chest Portable 1 View Result Date: 04/01/2024 CLINICAL DATA:  Shortness of breath. EXAM: PORTABLE CHEST 1 VIEW COMPARISON:  Radiograph 02/16/2024, CT 02/15/2019 FINDINGS: Prior median sternotomy. Left-sided pacemaker and right neurostimulator in place. Stable heart size and mediastinal contours. Chronic hyperinflation and bronchial thickening. Increased interstitial opacities in the left lung suspicious for asymmetric edema.  Blunting of the costophrenic angles is likely due to hyperinflation rather than pleural effusions and compared with prior CT. No pneumothorax. IMPRESSION: 1. Increased interstitial opacities in the left lung suspicious for asymmetric edema. 2. Chronic hyperinflation and bronchial thickening. Electronically Signed   By: Chadwick Colonel M.D.   On: 04/01/2024 10:01   CUP PACEART REMOTE DEVICE CHECK Result Date: 04/01/2024 ICD scheduled remote reviewed. Normal device function.  Presenting rhythm: VS Next remote 91 days. AB,CVRS   Microbiology: Recent Results (from the past 240 hours)  Resp panel by RT-PCR (RSV, Flu A&B, Covid) Anterior Nasal Swab     Status: None   Collection Time: 04/01/24  6:23 AM   Specimen: Anterior Nasal Swab  Result Value Ref Range Status   SARS Coronavirus 2 by RT PCR NEGATIVE NEGATIVE Final   Influenza A by PCR NEGATIVE NEGATIVE Final   Influenza B by PCR NEGATIVE NEGATIVE Final    Comment: (NOTE) The Xpert Xpress SARS-CoV-2/FLU/RSV plus assay is intended as an aid in the diagnosis of influenza from Nasopharyngeal swab specimens and should not be used as a sole basis for treatment. Nasal washings and aspirates are unacceptable for Xpert Xpress SARS-CoV-2/FLU/RSV testing.  Fact Sheet for Patients: BloggerCourse.com  Fact Sheet for Healthcare Providers: SeriousBroker.it  This test is not yet approved or cleared by the [place]  FDA and has been authorized for detection and/or diagnosis of SARS-CoV-2 by FDA under an Emergency Use Authorization (EUA). This EUA will remain in effect (meaning this test can be used) for the duration of the COVID-19 declaration under Section 564(b)(1) of the Act, 21 U.S.C. section 360bbb-3(b)(1), unless the authorization is terminated or revoked.     Resp Syncytial Virus by PCR NEGATIVE NEGATIVE Final    Comment: (NOTE) Fact Sheet for  Patients: BloggerCourse.com  Fact Sheet for Healthcare Providers: SeriousBroker.it  This test is not yet approved or cleared by the [place]  FDA and has been authorized for detection and/or diagnosis of SARS-CoV-2 by FDA under an Emergency Use Authorization (EUA). This EUA will remain in effect (meaning this test can be used) for the duration of the COVID-19 declaration under Section 564(b)(1) of the Act, 21 U.S.C. section 360bbb-3(b)(1), unless the authorization is terminated or revoked.  Performed at [facility], [address] [place], [place] [address]      Labs: Basic Metabolic Panel: Recent Labs  Lab 04/02/24 [phone] 04/03/24 0933 04/03/24 1930 04/04/24 0454 04/05/24 0500 04/06/24 0550 04/07/24 0448  NA 136   < > 139 133* 134* 133* 135  K 3.9   < > 4.5 3.5 3.5 2.9* 4.3  CL 103   < > 94* 87* 92* 90* 95*  CO2 23   < > 29 29 30 28 24   GLUCOSE 282*   < > 74 176* 133* 122* 123*  BUN 31*   < > 43* 53* 65* 60* 60*  CREATININE 1.61*   < > 1.49* 1.69*  1.58* 1.44* 1.70*  CALCIUM  9.4   < > 11.0* 10.8* 10.1 9.7 9.9  MG 2.4  --   --  2.4 2.2 2.0 2.2   < > = values in this interval not displayed.   Liver Function Tests: Recent Labs  Lab 04/02/24 0333  AST 25  ALT 16  ALKPHOS 61  BILITOT 0.8  PROT 6.7  ALBUMIN  3.5   No results for input(s): "LIPASE", "AMYLASE" in the last 168 hours. No results for input(s): "AMMONIA" in the last 168 hours. CBC: Recent Labs  Lab 04/01/24 0619 04/01/24 0625 04/02/24 0333 04/03/24 1806 04/06/24 0550  WBC  --  13.5* 10.3 11.3* 10.5  NEUTROABS  --  9.9*  --   --   --   HGB 14.6 13.1 10.6* 12.8 15.3*  HCT 43.0 42.7 32.8* 39.9 45.7  MCV  --  94.9 90.6 91.1 86.9  PLT  --  247 185 219 178   Cardiac Enzymes: No results for input(s): "CKTOTAL", "CKMB", "CKMBINDEX", "TROPONINI" in the last 168 hours. BNP: BNP (last 3 results) Recent Labs    02/14/24 2132 03/13/24 1127  04/01/24 0625  BNP 2,541.1* 1,892.9* 4,049.6*    ProBNP (last 3 results) No results for input(s): "PROBNP" in the last 8760 hours.  CBG: Recent Labs  Lab 04/06/24 0547 04/06/24 1234 04/06/24 1551 04/06/24 2049 04/07/24 0533  GLUCAP 110* 227* 83 356* 129*       Signed:  Deforest Fast MD.  [facility] 04/07/2024, 11:41 AM

## 2024-04-07 NOTE — Progress Notes (Signed)
 Pt has refused to lay flat after PICC was removed despite protocol. Pt insisted on leaving because her ride has been waiting for hours. Pt states she will not stop to get her meds form [facility] to have them sent  to her pharmacy at [facility]. Pt was taken down via wheel chair. Will let MD know pt has requested meds to be sent to [facility].

## 2024-04-07 NOTE — Progress Notes (Signed)
 PT Cancellation Note  Patient Details Name: Brittany [name]  MRN: [id number] DOB: 10/05/1961   Cancelled Treatment:    Reason Eval/Treat Not Completed: Patient declined, no reason specified (Pt refused PT session despite education and encouragement. She reports "I have been walking within the room on my own. I hate physical therapy. I don't need you. No! I am going to be walking out of this hospital.") Will continue PT efforts as schedule permits.  Glenford Lanes, PT, DPT Acute Rehabilitation Services Office: [phone] Secure Chat Preferred  [name] 04/07/2024, 8:42 AM

## 2024-04-07 NOTE — TOC Initial Note (Signed)
 Transition of Care [facility]) - Initial/Assessment Note    Patient Details  Name: Brittany [name]  MRN: [id number] Date of Birth: 11-10-1961  Transition [facility] Care [facility]) CM/SW Contact:    Benjiman Bras, RN Phone Number: [phone] 04/07/2024, 9:28 AM  Clinical Narrative:                 TOC CM spoke to pt and offered HH, pt declined. States she has necessary DME at home. Friend will provide transportation home.   Declined HH and outpatient palliative.   PCP appt scheduled for follow up on 5/1 at 2 pm.  Expected Discharge Plan: Home w Home Health Services Barriers to Discharge: No Barriers Identified   Patient Goals and CMS Choice Patient states their goals for this hospitalization and ongoing recovery are:: wants to remain independent CMS Medicare.gov Compare Post Acute Care list provided to:: Patient Choice offered to / list presented to : Patient      Expected Discharge Plan and Services   Discharge Planning Services: CM Consult Post Acute Care Choice: Home Health Living arrangements for the past 2 months: Hotel/Motel Expected Discharge Date: 04/07/24                         HH Arranged: Refused HH          Prior Living Arrangements/Services Living arrangements for the past 2 months: Hotel/Motel Lives with:: Self Patient language and need for interpreter reviewed:: Yes Do you feel safe going back to the place where you live?: Yes      Need for Family Participation in Patient Care: No (Comment) Care giver support system in place?: Yes (comment) Current home services: DME (rollator, home oxygen 2 liters with [employer], bipap) Criminal Activity/Legal Involvement Pertinent to Current Situation/Hospitalization: No - Comment as needed  Activities of Daily Living   ADL Screening (condition at time of admission) Independently performs ADLs?: Yes (appropriate for developmental age) Is the patient deaf or have difficulty hearing?: No Does the patient have  difficulty seeing, even when wearing glasses/contacts?: No Does the patient have difficulty concentrating, remembering, or making decisions?: No  Permission Sought/Granted Permission sought to share information with : Case Manager, Family Supports, PCP Permission granted to share information with : Yes, Verbal Permission Granted  Share Information with NAME: Florinda Hush  Permission granted to share info w AGENCY: Home Health  Permission granted to share info w Relationship: friend  Permission granted to share info w Contact Information: [phone]  Emotional Assessment Appearance:: Appears stated age Attitude/Demeanor/Rapport: Engaged Affect (typically observed): Accepting Orientation: : Oriented to Self, Oriented to Place, Oriented to  Time, Oriented to Situation   Psych Involvement: No (comment)  Admission diagnosis:  Acute respiratory failure with hypoxia (HCC) [J96.01] Patient Active Problem List   Diagnosis Date Noted   Acute respiratory failure (HCC) 04/01/2024   History of CAD (coronary artery disease) 12/15/2023   Hx of hypokalemia 12/15/2023   Respiratory distress 12/15/2023   Chronic respiratory failure with hypoxia and hypercapnia (HCC) 10/11/2023   Acute on chronic systolic CHF (congestive heart failure) (HCC) 09/25/2023   Acute kidney injury superimposed on chronic kidney disease (HCC) 08/04/2023   Acute on chronic respiratory failure (HCC) 08/03/2023   Trichomonas infection 04/19/2023   Presence of heart assist device (HCC) 04/11/2023   COPD with acute exacerbation (HCC) 03/01/2023   CAD (coronary artery disease) 03/01/2023   Acute hypoxic respiratory failure (HCC) 11/14/2022   Sepsis (HCC) 11/14/2022   Influenza  A with pneumonia 11/14/2022   Troponin level elevated 11/14/2022   History of lung cancer 11/14/2022   Acute combined systolic and diastolic CHF, NYHA class 4 (HCC) 07/09/2022   Malnutrition of moderate degree 06/20/2022   Lactic acidosis  04/12/2022   Prolonged QT interval 04/09/2022   Hypotension 04/09/2022   Chronic systolic heart failure (HCC) 12/11/2021   CHF exacerbation (HCC) 12/10/2021   Renal infarct (HCC) 12/10/2021   COPD exacerbation (HCC) 09/29/2021   Mixed diabetic hyperlipidemia associated with type 2 diabetes mellitus (HCC) 09/29/2021   CKD stage 3b, GFR 30-44 ml/min (HCC) 09/29/2021   Unstable angina (HCC) 02/09/2021   Hemoptysis 10/20/2020   Type 2 diabetes mellitus with hyperlipidemia (HCC) 10/20/2020   Acute on chronic combined systolic and diastolic CHF (congestive heart failure) (HCC) 05/21/2020   Cardiomyopathy, ischemic 04/12/2020   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   NSTEMI (non-ST elevated myocardial infarction) (HCC) 02/12/2019   CHF (congestive heart failure) (HCC) 11/24/2018   Post-operative pain 09/10/2018   COPD (chronic obstructive pulmonary disease) (HCC) 09/04/2018   Recurrent pleural effusion on right 05/05/2018   Acute on chronic respiratory failure with hypoxia (HCC) 05/05/2018   Chronic respiratory failure with hypoxia (HCC)    Asthma    Anxiety    Allergy    HCAP (healthcare-associated pneumonia) 03/15/2015   Chest pain 03/15/2015   Small cell lung cancer (HCC) 03/16/2014   Protein-calorie malnutrition, severe (HCC) 03/14/2014   PAF (paroxysmal atrial fibrillation) (HCC) 03/12/2014   Essential hypertension 05/07/2012   PCP:  Abraham Abo, MD Pharmacy:   [id number] [place], [place] - [address] [place] [address] Phone: [phone] Fax: [phone]     Social Drivers of Health (SDOH) Social History: SDOH Screenings   Food Insecurity: No Food Insecurity (04/01/2024)  Housing: Low Risk  (04/01/2024)  Transportation Needs: No Transportation Needs (04/01/2024)  Utilities: Not At Risk (04/01/2024)  Alcohol Screen: Low Risk  (02/11/2021)  Depression (PHQ2-9): Low Risk  (02/26/2024)  Financial Resource  Strain: Medium Risk (02/11/2021)  Physical Activity: Insufficiently Active (02/11/2021)  Social Connections: Unknown (02/11/2021)  Stress: Stress Concern Present (02/11/2021)  Tobacco Use: Medium Risk (04/01/2024)   SDOH Interventions:     Readmission Risk Interventions    02/18/2024    3:51 PM 06/20/2022   10:00 AM  Readmission Risk Prevention Plan  Transportation Screening Complete Complete  PCP or Specialist Appt within 3-5 Days  Complete  HRI or Home Care Consult  Complete  Social Work Consult for Recovery Care Planning/Counseling  Complete  Palliative Care Screening  Not Applicable  Medication Review Oceanographer) Complete Complete  PCP or Specialist appointment within 3-5 days of discharge Complete   HRI or Home Care Consult Complete   Palliative Care Screening Not Applicable   Skilled Nursing Facility Not Applicable

## 2024-04-08 ENCOUNTER — Telehealth: Payer: Self-pay

## 2024-04-08 NOTE — Transitions of Care (Post Inpatient/ED Visit) (Signed)
   04/08/2024  Name: Brittany [name]  MRN: [id number] DOB: [birth year]  Today's TOC FU Call Status: Today's TOC FU Call Status:: Successful TOC FU Call Completed TOC FU Call Complete Date: 04/08/24 Patient's Name and Date of Birth confirmed.  Transition Care Management Follow-up Telephone Call Date of Discharge: 04/07/24 Discharge Facility: [facility]) Type of Discharge: Inpatient Admission Primary Inpatient Discharge Diagnosis:: acute respiratory failure How have you been since you were released from the hospital?: Better (She said she is just tired) Any questions or concerns?: No  Items Reviewed: Did you receive and understand the discharge instructions provided?: Yes Medications obtained,verified, and reconciled?: No Medications Not Reviewed Reasons:: Other: (She said she has all of her medications as well as a nebulizer and she did not have any questions about the med regime and said she did not need to review the med list.) Any new allergies since your discharge?: No Dietary orders reviewed?: Yes Type of Diet Ordered:: heart healthy, low sodium. Do you have support at home?: Yes People in Home [RPT]: alone Name of Support/Comfort Primary Source: She said her sister comes to check on her daily.  Medications Reviewed Today: Medications Reviewed Today   Medications were not reviewed in this encounter     Home Care and Equipment/Supplies: Were Home Health Services Ordered?: No Any new equipment or medical supplies ordered?: No  Functional Questionnaire: Do you need assistance with bathing/showering or dressing?: No Do you need assistance with meal preparation?: No Do you need assistance with eating?: No Do you have difficulty maintaining continence: No Do you need assistance with getting out of bed/getting out of a chair/moving?: Yes (She has a rollator but is trying to ambulate without it  She also has O2 that she uses @  2L as needed at night. She also has a  BiPAP that she only uses " as needed.") Do you have difficulty managing or taking your medications?: No  Follow up appointments reviewed: PCP Follow-up appointment confirmed?: Yes Date of PCP follow-up appointment?: 04/15/24 Follow-up Provider: Dr Elvan [facility] Follow-up appointment confirmed?: Yes Date of Specialist follow-up appointment?: 04/18/24 Follow-Up Specialty Provider:: [facility] Do you need transportation to your follow-up appointment?: No Do you understand care options if your condition(s) worsen?: Yes-patient verbalized understanding    SIGNATURE Burnett Carson, RN

## 2024-04-10 ENCOUNTER — Inpatient Hospital Stay: Admitting: Family Medicine

## 2024-04-10 ENCOUNTER — Other Ambulatory Visit: Payer: Self-pay | Admitting: Pulmonary Disease

## 2024-04-10 NOTE — Telephone Encounter (Signed)
 Last Fill: Duoneb: 02/22/24     Ventolin : 06/20/22     Trelegy: 02/27/24  Last OV: 04/11/23 Next OV: 05/19/24  Routing to provider for review/authorization.

## 2024-04-10 NOTE — Telephone Encounter (Signed)
 Copied from CRM [id number]. Topic: Clinical - Medication Refill >> Apr 10, 2024  3:10 PM Tyronne Galloway wrote: Most Recent Primary Care Visit:  Provider: Abraham Abo  Department: [facility]  Visit Type: OFFICE VISIT  Date: 02/26/2024  Medication: ipratropium-albuterol  (DUONEB) 0.5-2.5 (3) MG/3ML SOLN   Has the patient contacted their pharmacy? Yes; no refills left (Agent: If no, request that the patient contact the pharmacy for the refill. If patient does not wish to contact the pharmacy document the reason why and proceed with request.) (Agent: If yes, when and what did the pharmacy advise?)  Is this the correct pharmacy for this prescription? Yes If no, delete pharmacy and type the correct one.  This is the patient's preferred pharmacy:  [facility] [address] [place] - [address] [place] [address] Phone: [phone] Fax: [phone]   Has the prescription been filled recently? Yes  Is the patient out of the medication? Yes  Has the patient been seen for an appointment in the last year OR does the patient have an upcoming appointment? Yes  Can we respond through MyChart? No; prefer a phone call  Agent: Please be advised that Rx refills may take up to 3 business days. We ask that you follow-up with your pharmacy.

## 2024-04-10 NOTE — Telephone Encounter (Signed)
 Copied from CRM [id number]. Topic: Clinical - Medication Refill >> Apr 10, 2024  3:12 PM Brittany [name] wrote: Most Recent Primary Care Visit:  Provider: Abraham Abo  [facility]: [facility]  Visit Type: OFFICE VISIT  Date: 02/26/2024  Medication: albuterol  (VENTOLIN  HFA) 108 (90 Base) MCG/ACT inhaler   Has the patient contacted their pharmacy? Yes; no refills left (Agent: If no, request that the patient contact the pharmacy for the refill. If patient does not wish to contact the pharmacy document the reason why and proceed with request.) (Agent: If yes, when and what did the pharmacy advise?)  Is this the correct pharmacy for this prescription? Yes If no, delete pharmacy and type the correct one.  This is the patient's preferred pharmacy:  [facility] [address], [place] - [address] [place] [address] Phone: [phone] Fax: [phone]   Has the prescription been filled recently? No  Is the patient out of the medication? Yes  Has the patient been seen for an appointment in the last year OR does the patient have an upcoming appointment? Yes  Can we respond through MyChart? No; prefers a phone call  Agent: Please be advised that Rx refills may take up to 3 business days. We ask that you follow-up with your pharmacy.

## 2024-04-10 NOTE — Telephone Encounter (Signed)
 Copied from CRM [id number]. Topic: Clinical - Medication Refill >> Apr 10, 2024  3:07 PM Tyronne Galloway wrote: Most Recent Primary Care Visit:  Provider: Abraham Abo  [facility]  Visit Type: OFFICE VISIT  Date: 02/26/2024  Medication: Fluticasone -Umeclidin-Vilant (TRELEGY ELLIPTA ) 200-62.5-25 MCG/ACT AEPB   Has the patient contacted their pharmacy? Yes; out of refills.  (Agent: If no, request that the patient contact the pharmacy for the refill. If patient does not wish to contact the pharmacy document the reason why and proceed with request.) (Agent: If yes, when and what did the pharmacy advise?)  Is this the correct pharmacy for this prescription? Yes If no, delete pharmacy and type the correct one.  This is the patient's preferred pharmacy:  [facility] [address], [place] - [address] [place] [address] Phone: [phone] Fax: [phone]   Has the prescription been filled recently? Yes  Is the patient out of the medication? Yes; 2 or 3 pulls or so of the medication left   Has the patient been seen for an appointment in the last year OR does the patient have an upcoming appointment? Yes  Can we respond through MyChart? No; pt prefers a phone call for follow up  Agent: Please be advised that Rx refills may take up to 3 business days. We ask that you follow-up with your pharmacy.

## 2024-04-14 ENCOUNTER — Telehealth: Payer: Self-pay

## 2024-04-14 ENCOUNTER — Encounter

## 2024-04-14 ENCOUNTER — Other Ambulatory Visit (HOSPITAL_COMMUNITY): Payer: Self-pay

## 2024-04-14 ENCOUNTER — Telehealth: Payer: Self-pay | Admitting: Pharmacy Technician

## 2024-04-14 NOTE — Telephone Encounter (Signed)
 Pt home from hospital and feeling okay at this time.  She has HF clinic appt on 5/9 for follow up.  She will send manual ICM remote transmission for review on 5/8.

## 2024-04-14 NOTE — Telephone Encounter (Signed)
 Pharmacy Patient Advocate Encounter   Received notification from Fax that prior authorization for Farxiga  is required/requested.   Insurance verification completed.   The patient is insured through [facility] .   Per test claim: PA required; PA submitted to above mentioned insurance via CoverMyMeds Key/confirmation #/EOC [id number] Status is pending

## 2024-04-15 ENCOUNTER — Other Ambulatory Visit (HOSPITAL_COMMUNITY): Payer: Self-pay

## 2024-04-15 ENCOUNTER — Ambulatory Visit (INDEPENDENT_AMBULATORY_CARE_PROVIDER_SITE_OTHER): Admitting: Family Medicine

## 2024-04-15 ENCOUNTER — Encounter: Payer: Self-pay | Admitting: Family Medicine

## 2024-04-15 VITALS — BP 103/67 | HR 86 | Wt 97.0 lb

## 2024-04-15 DIAGNOSIS — N184 Chronic kidney disease, stage 4 (severe): Secondary | ICD-10-CM

## 2024-04-15 DIAGNOSIS — J42 Unspecified chronic bronchitis: Secondary | ICD-10-CM

## 2024-04-15 DIAGNOSIS — I5022 Chronic systolic (congestive) heart failure: Secondary | ICD-10-CM

## 2024-04-15 DIAGNOSIS — Z09 Encounter for follow-up examination after completed treatment for conditions other than malignant neoplasm: Secondary | ICD-10-CM | POA: Diagnosis not present

## 2024-04-15 DIAGNOSIS — R634 Abnormal weight loss: Secondary | ICD-10-CM

## 2024-04-15 NOTE — Progress Notes (Unsigned)
 Established Patient Office Visit  Subjective    Patient ID: Brittany [name] , female    DOB: 09-Jun-1961  Age: 63 y.o. MRN: [id number]  CC:  Chief Complaint  Patient presents with   Hospitalization Follow-up    HPI Brittany [name]  presents for routine hospital discharge. Patient was dx with heart failure, respiratory failure and CKD. recommendation was to be discharged with hospice and/or palliative care but patient refused. She does have follow up appts scheduled with cardiology and pulmonary. She reports improved since discharge.   Outpatient Encounter Medications as of 04/15/2024  Medication Sig   albuterol  (VENTOLIN  HFA) 108 (90 Base) MCG/ACT inhaler Inhale 2 puffs into the lungs every 6 (six) hours as needed for wheezing or shortness of breath.   atorvastatin  (LIPITOR ) 80 MG tablet Take 1 tablet (80 mg total) by mouth daily.   cyclobenzaprine  (FLEXERIL ) 10 MG tablet Take 1 tablet (10 mg total) by mouth 3 (three) times daily as needed for muscle spasms. (Patient taking differently: Take 0.5 mg by mouth daily as needed for muscle spasms.)   dapagliflozin  propanediol (FARXIGA ) 10 MG TABS tablet Take 1 tablet (10 mg total) by mouth daily.   digoxin  (LANOXIN ) 0.125 MG tablet Take 1 tablet (125 mcg total) by mouth at bedtime.   ELIQUIS  2.5 MG TABS tablet TAKE 1 TABLET BY MOUTH TWICE A DAY   Fluticasone -Umeclidin-Vilant (TRELEGY ELLIPTA ) 200-62.5-25 MCG/ACT AEPB Inhale 1 puff into the lungs daily.   hydrOXYzine  (VISTARIL ) 25 MG capsule TAKE 1 CAPSULE (25 MG TOTAL) BY MOUTH DAILY AS NEEDED. (Patient taking differently: Take 25 mg by mouth daily as needed for anxiety.)   ipratropium-albuterol  (DUONEB) 0.5-2.5 (3) MG/3ML SOLN TAKE 3 MLS BY NEBULIZATION EVERY 4 (FOUR) HOURS AS NEEDED (FOR SHORTNESS OF BREATH).   ivabradine  (CORLANOR ) 7.5 MG TABS tablet Take 1 tablet (7.5 mg total) by mouth 2 (two) times daily with a meal.   megestrol  (MEGACE ) 20 MG tablet Take 1 tablet (20 mg  total) by mouth 2 (two) times daily.   metolazone  (ZAROXOLYN ) 2.5 MG tablet Take 1 tablet (2.5 mg total) by mouth as needed (as instructed by [facility]). with 40 mEq of Potassium 3 times per day on metolazone  days   Multiple Vitamin (MULTIVITAMIN WITH MINERALS) TABS tablet Take 1 tablet by mouth daily.   nitroGLYCERIN  (NITROSTAT ) 0.4 MG SL tablet Place 1 tablet (0.4 mg total) under the tongue every 5 (five) minutes as needed for chest pain.   Nutritional Supplements (ENSURE ORIGINAL) LIQD Take 1 Bottle by mouth 2 (two) times daily after a meal. Drink 1 bottle 2 times daily.   potassium chloride  (KLOR-CON ) 20 MEQ packet Dissolve two (40mEq total) packets in 4-6oz of fluid and take by mouth daily but take it three times a day when taking Metolazone  2.5mg .   spironolactone  (ALDACTONE ) 50 MG tablet Take 1 tablet (50 mg total) by mouth daily.   torsemide  (DEMADEX ) 100 MG tablet Take 1 tablet (100 mg total) by mouth 2 (two) times daily.   No facility-administered encounter medications on file as of 04/15/2024.    Past Medical History:  Diagnosis Date   Acute respiratory failure (HCC) 05/05/2018   Acute systolic congestive heart failure (HCC) 02/03/2018   AICD (automatic cardioverter/defibrillator) present    Allergy    Anxiety    Asthma    DM2 (diabetes mellitus, type 2) (HCC) 10/20/2020   Hypertension    PAF (paroxysmal atrial fibrillation) (HCC)    Presence of permanent cardiac pacemaker    Prophylactic  measure 08/03/14-08/19/14   Prophyl. 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   LIMA to LAD, SVG to D1, SVG to OM1, EVH via right thigh with implantation of Impella LD LVAD via direct aortic approach   Small cell lung cancer (HCC) 03/16/2014    Past Surgical History:  Procedure Laterality Date   BRONCHIAL BRUSHINGS  10/25/2020   Procedure: BRONCHIAL BRUSHINGS;  Surgeon: Denson Flake, MD;  Location: [facility] ENDOSCOPY;  Service: Pulmonary;;   BRONCHIAL NEEDLE  ASPIRATION BIOPSY  10/25/2020   Procedure: BRONCHIAL NEEDLE ASPIRATION BIOPSIES;  Surgeon: Denson Flake, MD;  Location: [facility] ENDOSCOPY;  Service: Pulmonary;;   CARDIAC DEFIBRILLATOR PLACEMENT  08/15/2018   MDT Visia AF MRI VR ICD implanted by Dr Dolly Fret for primary prevention of sudden   CESAREAN SECTION     CORONARY ARTERY BYPASS GRAFT N/A 02/03/2018   Procedure: CORONARY ARTERY BYPASS GRAFTING (CABG);  Surgeon: Gardenia Jump, MD;  Location[facility] OR;  Service: Open Heart Surgery;  Laterality: N/A;  Time 3 using left internal mammary artery and endoscopically harvested right saphenous vein   CORONARY BALLOON ANGIOPLASTY N/A 02/03/2018   Procedure: CORONARY BALLOON ANGIOPLASTY;  Surgeon: Swaziland, Peter M, MD;  Location: [facility] INVASIVE CV LAB;  Service: Cardiovascular;  Laterality: N/A;   CORONARY STENT INTERVENTION N/A 02/12/2019   Procedure: CORONARY STENT INTERVENTION;  Surgeon: Wenona Hamilton, MD;  Location: [facility] INVASIVE CV LAB;  Service: Cardiovascular;  Laterality: N/A;   CORONARY/GRAFT ACUTE MI REVASCULARIZATION N/A 02/03/2018   Procedure: Coronary/Graft Acute MI Revascularization;  Surgeon: Swaziland, Peter M, MD;  Location: [facility] INVASIVE CV LAB;  Service: Cardiovascular;  Laterality: N/A;   ENDOBRONCHIAL ULTRASOUND N/A 10/25/2020   Procedure: ENDOBRONCHIAL ULTRASOUND;  Surgeon: Denson Flake, MD;  Location: [facility] ENDOSCOPY;  Service: Pulmonary;  Laterality: N/A;   FLEXIBLE BRONCHOSCOPY  10/25/2020   Procedure: FLEXIBLE BRONCHOSCOPY;  Surgeon: Denson Flake, MD;  Location: [facility] ENDOSCOPY;  Service: Pulmonary;;   IABP INSERTION N/A 02/03/2018   Procedure: IABP Insertion;  Surgeon: Swaziland, Peter M, MD;  Location: [facility] INVASIVE CV LAB;  Service: Cardiovascular;  Laterality: N/A;   INTRAOPERATIVE TRANSESOPHAGEAL ECHOCARDIOGRAM N/A 02/03/2018   Procedure: INTRAOPERATIVE TRANSESOPHAGEAL ECHOCARDIOGRAM;  Surgeon: Gardenia Jump, MD;  Location: [facility] OR;  Service: Open Heart Surgery;  Laterality: N/A;   LEFT  HEART CATH AND CORONARY ANGIOGRAPHY N/A 02/03/2018   Procedure: LEFT HEART CATH AND CORONARY ANGIOGRAPHY;  Surgeon: Swaziland, Peter M, MD;  Location: [facility] INVASIVE CV LAB;  Service: Cardiovascular;  Laterality: N/A;   LEFT HEART CATH AND CORS/GRAFTS ANGIOGRAPHY N/A 02/12/2019   Procedure: LEFT HEART CATH AND CORS/GRAFTS ANGIOGRAPHY;  Surgeon: Wenona Hamilton, MD;  Location: [facility] INVASIVE CV LAB;  Service: Cardiovascular;  Laterality: N/A;   MEDIASTINOSCOPY N/A 03/11/2014   Procedure: MEDIASTINOSCOPY;  Surgeon: Zelphia Higashi, MD;  Location: [facility] OR;  Service: Thoracic;  Laterality: N/A;   PLACEMENT OF IMPELLA LEFT VENTRICULAR ASSIST DEVICE  02/03/2018   Procedure: PLACEMENT OF IMPELLA LEFT VENTRICULAR ASSIST DEVICE LD;  Surgeon: Gardenia Jump, MD;  Location: [facility] OR;  Service: Open Heart Surgery;;   REMOVAL OF IMPELLA LEFT VENTRICULAR ASSIST DEVICE N/A 02/08/2018   Procedure: REMOVAL OF IMPELLA LEFT VENTRICULAR ASSIST DEVICE;  Surgeon: Gardenia Jump, MD;  Location: [facility] OR;  Service: Open Heart Surgery;  Laterality: N/A;   RIGHT HEART CATH N/A 02/03/2018   Procedure: RIGHT HEART CATH;  Surgeon: Swaziland, Peter M, MD;  Location: [facility] INVASIVE CV LAB;  Service:  Cardiovascular;  Laterality: N/A;   RIGHT HEART CATH N/A 05/09/2018   Procedure: RIGHT HEART CATH;  Surgeon: Mardell Shade, MD;  Location: [facility] INVASIVE CV LAB;  Service: Cardiovascular;  Laterality: N/A;   TEE WITHOUT CARDIOVERSION N/A 02/08/2018   Procedure: TRANSESOPHAGEAL ECHOCARDIOGRAM (TEE);  Surgeon: Gardenia Jump, MD;  Location: [facility] OR;  Service: Open Heart Surgery;  Laterality: N/A;   TUBAL LIGATION     VIDEO BRONCHOSCOPY WITH ENDOBRONCHIAL ULTRASOUND N/A 03/11/2014   Procedure: VIDEO BRONCHOSCOPY WITH ENDOBRONCHIAL ULTRASOUND;  Surgeon: Zelphia Higashi, MD;  Location: [facility] OR;  Service: Thoracic;  Laterality: N/A;    Family History  Problem Relation Age of Onset   Heart disease Mother    Hypertension Mother    Heart attack Mother     Hypertension Maternal Grandmother    Cancer Maternal Grandmother    Diabetes Paternal Grandmother    Stroke Neg Hx     Social History   Socioeconomic History   Marital status: Married    Spouse name: Fredericka James Washington     Number of children: 1   Years of education: Not on file   Highest education level: Not on file  Occupational History   Occupation: disabled  Tobacco Use   Smoking status: Former    Current packs/day: 0.00    Average packs/day: 1 pack/day for 20.0 years (20.0 ttl pk-yrs)    Types: Cigarettes    Start date: 03/13/1994    Quit date: 03/13/2014    Years since quitting: 10.1   Smokeless tobacco: Never  Vaping Use   Vaping status: Never Used  Substance and Sexual Activity   Alcohol use: Yes    Alcohol/week: 5.0 standard drinks of alcohol    Types: 5 Glasses of wine per week    Comment: a few glasses of wine weekly   Drug use: Yes    Types: Marijuana    Comment: "medicinal", not daily   Sexual activity: Never  Other Topics Concern   Not on file  Social History Narrative   Not on file   Social Drivers of Health   Financial Resource Strain: Medium Risk (02/11/2021)   Overall Financial Resource Strain (CARDIA)    Difficulty of Paying Living Expenses: Somewhat hard  Food Insecurity: No Food Insecurity (04/01/2024)   Hunger Vital Sign    Worried About Running Out of Food in the Last Year: Never true    Ran Out of Food in the Last Year: Never true  Transportation Needs: No Transportation Needs (04/01/2024)   PRAPARE - Administrator, Civil Service (Medical): No    Lack of Transportation (Non-Medical): No  Physical Activity: Insufficiently Active (02/11/2021)   Exercise Vital Sign    Days of Exercise per Week: 2 days    Minutes of Exercise per Session: 10 min  Stress: Stress Concern Present (02/11/2021)   [employer] of Occupational Health - Occupational Stress Questionnaire    Feeling of Stress : To some extent  Social Connections: Unknown  (02/11/2021)   Social Connection and Isolation Panel [NHANES]    Frequency of Communication with Friends and Family: More than three times a week    Frequency of Social Gatherings with Friends and Family: Twice a week    Attends Religious Services: Not on file    Active Member of Clubs or Organizations: Not on file    Attends Banker Meetings: Never    Marital Status: Married  Catering manager Violence: Not At Risk (04/01/2024)  Humiliation, Afraid, Rape, and Kick questionnaire    Fear of Current or Ex-Partner: No    Emotionally Abused: No    Physically Abused: No    Sexually Abused: No    Review of Systems  Constitutional:  Positive for weight loss.  Respiratory:  Positive for shortness of breath. Negative for cough.   Cardiovascular:  Negative for chest pain.  All other systems reviewed and are negative.       Objective    BP 103/67 (BP Location: Right Arm, Patient Position: Sitting, Cuff Size: Normal)   Pulse 86   Wt 97 lb (44 kg)   SpO2 94%   BMI 19.26 kg/m   Physical Exam Vitals and nursing note reviewed.  Constitutional:      General: She is not in acute distress. Cardiovascular:     Rate and Rhythm: Normal rate and regular rhythm.  Pulmonary:     Effort: Pulmonary effort is normal.     Breath sounds: Normal breath sounds. Stridor present.  Abdominal:     Palpations: Abdomen is soft.     Tenderness: There is no abdominal tenderness.  Neurological:     General: No focal deficit present.     Mental Status: She is alert and oriented to person, place, and time.         Assessment & Plan:   Hospital discharge follow-up  Chronic systolic congestive heart failure (HCC)  Chronic bronchitis, unspecified chronic bronchitis type (HCC)  Chronic kidney disease (CKD), stage IV (severe) (HCC)  Loss of weight   Discussed compliance with patient. Patient not utilizing oxygen or other resources that she has.. patient v.u.  No follow-ups on file.    Arlo Lama, MD

## 2024-04-16 ENCOUNTER — Encounter: Payer: Self-pay | Admitting: Pharmacy Technician

## 2024-04-16 ENCOUNTER — Other Ambulatory Visit (HOSPITAL_COMMUNITY): Payer: Self-pay

## 2024-04-16 NOTE — Telephone Encounter (Signed)
 Pharmacy Patient Advocate Encounter  Received notification from [facility] that Prior Authorization for Dapagliflozin  Propanediol 10MG  tablets has been APPROVED from 04/14/24 to 12/10/24. Spoke to pharmacy to process.Copay is [facility] wants to know where she wants this filled at since last got at hospital.   Tried to call patient and no answer. Seth Daniel   PA #/Case ID/Reference #: [id number]

## 2024-04-17 ENCOUNTER — Telehealth (HOSPITAL_COMMUNITY): Payer: Self-pay | Admitting: Internal Medicine

## 2024-04-17 ENCOUNTER — Encounter

## 2024-04-17 ENCOUNTER — Encounter: Payer: Self-pay | Admitting: Family Medicine

## 2024-04-17 NOTE — Progress Notes (Signed)
 No ICM remote transmission received for 04/17/2024 and next ICM transmission scheduled for 05/06/2024.

## 2024-04-17 NOTE — Telephone Encounter (Signed)
 Called to confirm/remind patient of their appointment at the [facility] on 04/17/2024.   Appointment:   [x] Confirmed  [] Left mess   [] No answer/No voice mail  [] VM Full/unable to leave message  [] Phone not in service  Patient reminded to bring all medications and/or complete list.  Confirmed patient has transportation. Gave directions, instructed to utilize valet parking.

## 2024-04-18 ENCOUNTER — Ambulatory Visit (HOSPITAL_COMMUNITY)
Admission: RE | Admit: 2024-04-18 | Discharge: 2024-04-18 | Disposition: A | Discharge status: Home / Self Care | Source: Ambulatory Visit | Attending: Internal Medicine | Admitting: Internal Medicine

## 2024-04-18 ENCOUNTER — Encounter (HOSPITAL_COMMUNITY): Payer: Self-pay | Admitting: Internal Medicine

## 2024-04-18 VITALS — BP 104/70 | HR 94 | Wt 99.4 lb

## 2024-04-18 DIAGNOSIS — I480 Paroxysmal atrial fibrillation: Secondary | ICD-10-CM | POA: Insufficient documentation

## 2024-04-18 DIAGNOSIS — I130 Hypertensive heart and chronic kidney disease with heart failure and stage 1 through stage 4 chronic kidney disease, or unspecified chronic kidney disease: Secondary | ICD-10-CM | POA: Diagnosis not present

## 2024-04-18 DIAGNOSIS — Z955 Presence of coronary angioplasty implant and graft: Secondary | ICD-10-CM | POA: Insufficient documentation

## 2024-04-18 DIAGNOSIS — J9621 Acute and chronic respiratory failure with hypoxia: Secondary | ICD-10-CM | POA: Diagnosis not present

## 2024-04-18 DIAGNOSIS — Z66 Do not resuscitate: Secondary | ICD-10-CM | POA: Diagnosis not present

## 2024-04-18 DIAGNOSIS — R627 Adult failure to thrive: Secondary | ICD-10-CM | POA: Diagnosis not present

## 2024-04-18 DIAGNOSIS — Z951 Presence of aortocoronary bypass graft: Secondary | ICD-10-CM | POA: Diagnosis not present

## 2024-04-18 DIAGNOSIS — Z87891 Personal history of nicotine dependence: Secondary | ICD-10-CM | POA: Diagnosis not present

## 2024-04-18 DIAGNOSIS — E1122 Type 2 diabetes mellitus with diabetic chronic kidney disease: Secondary | ICD-10-CM | POA: Insufficient documentation

## 2024-04-18 DIAGNOSIS — Z79899 Other long term (current) drug therapy: Secondary | ICD-10-CM | POA: Insufficient documentation

## 2024-04-18 DIAGNOSIS — I2510 Atherosclerotic heart disease of native coronary artery without angina pectoris: Secondary | ICD-10-CM | POA: Insufficient documentation

## 2024-04-18 DIAGNOSIS — Z7984 Long term (current) use of oral hypoglycemic drugs: Secondary | ICD-10-CM | POA: Insufficient documentation

## 2024-04-18 DIAGNOSIS — I5023 Acute on chronic systolic (congestive) heart failure: Secondary | ICD-10-CM

## 2024-04-18 DIAGNOSIS — I252 Old myocardial infarction: Secondary | ICD-10-CM | POA: Diagnosis not present

## 2024-04-18 DIAGNOSIS — J440 Chronic obstructive pulmonary disease with acute lower respiratory infection: Secondary | ICD-10-CM | POA: Insufficient documentation

## 2024-04-18 DIAGNOSIS — I5022 Chronic systolic (congestive) heart failure: Secondary | ICD-10-CM | POA: Diagnosis not present

## 2024-04-18 DIAGNOSIS — Z7901 Long term (current) use of anticoagulants: Secondary | ICD-10-CM | POA: Diagnosis not present

## 2024-04-18 DIAGNOSIS — N1832 Chronic kidney disease, stage 3b: Secondary | ICD-10-CM | POA: Diagnosis not present

## 2024-04-18 DIAGNOSIS — J441 Chronic obstructive pulmonary disease with (acute) exacerbation: Secondary | ICD-10-CM | POA: Insufficient documentation

## 2024-04-18 LAB — DIGOXIN LEVEL: Digoxin Level: <0.4 ng/mL — ABNORMAL LOW (ref 0.8–2.0)

## 2024-04-18 LAB — BASIC METABOLIC PANEL WITH GFR
Anion gap: 11 (ref 5–15)
BUN: 16 mg/dL (ref 8–23)
CO2: 23 mmol/L (ref 22–32)
Calcium: 9.1 mg/dL (ref 8.9–10.3)
Chloride: 105 mmol/L (ref 98–111)
Creatinine, Ser: 1.2 mg/dL — ABNORMAL HIGH (ref 0.44–1.00)
GFR, Estimated: 51 mL/min — ABNORMAL LOW (ref >60)
Glucose, Bld: 145 mg/dL — ABNORMAL HIGH (ref 70–99)
Potassium: 3.2 mmol/L — ABNORMAL LOW (ref 3.5–5.1)
Sodium: 139 mmol/L (ref 135–145)

## 2024-04-18 MED ORDER — METOLAZONE 5 MG PO TABS
5.0000 mg | ORAL_TABLET | ORAL | 3 refills | Status: DC | PRN
Start: 2024-04-18 — End: 2024-10-22

## 2024-04-18 MED ORDER — FUROSCIX 80 MG/10ML ~~LOC~~ CTKT: 80.0000 mg | CARTRIDGE | SUBCUTANEOUS | Status: DC

## 2024-04-18 MED ORDER — POTASSIUM CHLORIDE 20 MEQ PO PACK: 40.0000 meq | PACK | Freq: Every day | ORAL | 11 refills | Status: DC

## 2024-04-18 MED ORDER — DIGOXIN 125 MCG PO TABS: 125.0000 ug | ORAL_TABLET | Freq: Every evening | ORAL | 3 refills | Status: DC

## 2024-04-18 MED ORDER — DAPAGLIFLOZIN PROPANEDIOL 10 MG PO TABS: 10.0000 mg | ORAL_TABLET | Freq: Every day | ORAL | 3 refills | Status: DC

## 2024-04-18 NOTE — Patient Instructions (Signed)
 Great to see you today!!!  Medication Changes:  Your provider has order Furoscix  for you. This is an on-body infuser that gives you a dose of Furosemide .   It will be shipped to your home   [employer]  [facility] will call you to discuss before shipping so, PLEASE answer unknown calls  For questions regarding the device call [employer]  [employer] at [phone]  Ensure you write down the time you start your infusion so that if there is a problem you will know how long the infusion lasted  Use Furoscix  only AS DIRECTED by our office  Dosing Directions:   Day 1= TODAY Fri 5/9 take Furoscix , Metolazone  5 mg, extra 60 meq (3 packets) of Potassium, and Torsemide  100 mg in PM  Day 2= Saturday 5/10 take Furoscix , Metolazone  5 mg, extra 60 meq (3 packets) of Potassium, and Torsemide  100 mg in PM  Day 3= RESTART Torsemide  100 mg Twice daily    Lab Work:  Labs done today, your results will be available in MyChart, we will contact you for abnormal readings.  Special Instructions // Education:  Do the following things EVERYDAY: Weigh yourself in the morning before breakfast. Write it down and keep it in a log. Take your medicines as prescribed Eat low salt foods--Limit salt (sodium) to 2000 mg per day.  Stay as active as you can everyday Limit all fluids for the day to less than 2 liters   Follow-Up in: Tuesday 04/22/24 at 10:30   At the [facility], you and your health needs are our priority. We have a designated team specialized in the treatment of Heart Failure. This Care Team includes your primary Heart Failure Specialized Cardiologist (physician), Advanced Practice Providers (APPs- Physician Assistants and Nurse Practitioners), and Pharmacist who all work together to provide you with the care you need, when you need it.   You may see any of the following providers on your designated Care Team at your next follow up:  Dr. Jules Oar Dr. Peder Bourdon Dr. Alwin Baars Dr. Judyth Nunnery Nieves Bars, NP Ruddy Corral, [place] [facility] [place], [place] Dennise Fitz, NP [place] Lee, NP Luster Salters, PharmD   Please be sure to bring in all your medications bottles to every appointment.   Need to Contact Us :  If you have any questions or concerns before your next appointment please send us  a message through [place] or call our office at [phone].    TO LEAVE A MESSAGE FOR THE NURSE SELECT OPTION 2, PLEASE LEAVE A MESSAGE INCLUDING: YOUR NAME DATE OF BIRTH CALL BACK NUMBER REASON FOR CALL**this is important as we prioritize the call backs  YOU WILL RECEIVE A CALL BACK THE SAME DAY AS LONG AS YOU CALL BEFORE 4:00 PM

## 2024-04-18 NOTE — Progress Notes (Signed)
 Provided patient education on Furoscix using demo kits and Furoscix video, QR code provided on AVS for further viewing. Furoscix order submitted online, ov note and ins card uploaded to [employer].

## 2024-04-18 NOTE — Progress Notes (Signed)
 Advanced Heart Failure Clinic Note  PCP:  Abraham Abo, MD  Oncology: Dr Liam Redhead  HF Cardiologist: Dr Julane Ny  Reason for Visit: Heart Failure Follow-up HPI: Brittany [name]  is a 62 y.o. female with a history of PAF, CAD s/p emergent CABG in 2019 and DES in 2020, R renal infarct in 2022,  previous smoker quit in 2015, hypertension, previous small cell lung cancer treated with chemo, chest XRT and prophylactic brain radiation in 2015, and chronic systolic HF EF ~25%  Admitted 01/9561 with NSTEMI and shock. Underwent emergent cath 02/03/18 showed LAD 100% stenosed, LCx 95% stenosed. Taken for emergent CABG 02/03/18. Required impella post op. Hospital course complicated by cardiogenic shock, HCAP, A fib, respiratory failure, and swallowing issues. She was discharged to SNF. Discharge weight 103 pounds.    In 2019 had multiple hospitalizations for HF and pleural effusion. Underwent pleurodesis at [facility].   Admitted 3/20 with NSTEMI and HF. Received DES to ostial ramus into distal left main based on cath below and underwent diuresis. Meds adjusted as tolerated. Echo with EF 25-30%.    Admitted twice in 2023 with low output requiring milrinone . Barostim placed.   Multiple CHF and AECOPD admission in May, August, October of 2024 and Janurary 2025. Echo 1/25 EF  < 20%, G2DD, RV moderately to severely reduced. She remained DNR.  Admitted  3/6-3/10/2024 for CHF and AECOPD. Readmitted for CHF/COPD. Was hypotensive. Moved to ICU. Required DBA and IV diuresis. Refused [facility] services.   She returns today for post-hospital f/u. Not feeling. Started wheezing several days ago. Very SOB. + orthopnea. Weight up 2 pounds. Taking torsemide  100 bid. Drinking water, juice and coffee    Past Medical History:  Diagnosis Date   Acute respiratory failure (HCC) 05/05/2018   Acute systolic congestive heart failure (HCC) 02/03/2018   AICD (automatic cardioverter/defibrillator) present     Allergy    Anxiety    Asthma    DM2 (diabetes mellitus, type 2) (HCC) 10/20/2020   Hypertension    PAF (paroxysmal atrial fibrillation) (HCC)    Presence of permanent cardiac pacemaker    Prophylactic measure 08/03/14-08/19/14   Prophyl. 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   LIMA to LAD, SVG to D1, SVG to OM1, EVH via right thigh with implantation of Impella LD LVAD via direct aortic approach   Small cell lung cancer (HCC) 03/16/2014   Past Surgical History:  Procedure Laterality Date   BRONCHIAL BRUSHINGS  10/25/2020   Procedure: BRONCHIAL BRUSHINGS;  Surgeon: Denson Flake, MD;  Location: [facility] ENDOSCOPY;  Service: Pulmonary;;   BRONCHIAL NEEDLE ASPIRATION BIOPSY  10/25/2020   Procedure: BRONCHIAL NEEDLE ASPIRATION BIOPSIES;  Surgeon: Denson Flake, MD;  Location: [facility] ENDOSCOPY;  Service: Pulmonary;;   CARDIAC DEFIBRILLATOR PLACEMENT  08/15/2018   MDT Visia AF MRI VR ICD implanted by Dr Dolly Fret for primary prevention of sudden   CESAREAN SECTION     CORONARY ARTERY BYPASS GRAFT N/A 02/03/2018   Procedure: CORONARY ARTERY BYPASS GRAFTING (CABG);  Surgeon: Gardenia Jump, MD;  Location: [facility] OR;  Service: Open Heart Surgery;  Laterality: N/A;  Time 3 using left internal mammary artery and endoscopically harvested right saphenous vein   CORONARY BALLOON ANGIOPLASTY N/A 02/03/2018   Procedure: CORONARY BALLOON ANGIOPLASTY;  Surgeon: Swaziland, Peter M, MD;  Location: [facility] INVASIVE CV LAB;  Service: Cardiovascular;  Laterality: N/A;   CORONARY STENT INTERVENTION N/A 02/12/2019   Procedure: CORONARY STENT INTERVENTION;  Surgeon:  Wenona Hamilton, MD;  Location: [facility] INVASIVE CV LAB;  Service: Cardiovascular;  Laterality: N/A;   CORONARY/GRAFT ACUTE MI REVASCULARIZATION N/A 02/03/2018   Procedure: Coronary/Graft Acute MI Revascularization;  Surgeon: Swaziland, Peter M, MD;  Location: [facility] INVASIVE CV LAB;  Service: Cardiovascular;  Laterality: N/A;   ENDOBRONCHIAL ULTRASOUND N/A  10/25/2020   Procedure: ENDOBRONCHIAL ULTRASOUND;  Surgeon: Denson Flake, MD;  Location: [facility] ENDOSCOPY;  Service: Pulmonary;  Laterality: N/A;   FLEXIBLE BRONCHOSCOPY  10/25/2020   Procedure: FLEXIBLE BRONCHOSCOPY;  Surgeon: Denson Flake, MD;  Location: [facility] ENDOSCOPY;  Service: Pulmonary;;   IABP INSERTION N/A 02/03/2018   Procedure: IABP Insertion;  Surgeon: Swaziland, Peter M, MD;  Location: [facility] INVASIVE CV LAB;  Service: Cardiovascular;  Laterality: N/A;   INTRAOPERATIVE TRANSESOPHAGEAL ECHOCARDIOGRAM N/A 02/03/2018   Procedure: INTRAOPERATIVE TRANSESOPHAGEAL ECHOCARDIOGRAM;  Surgeon: Gardenia Jump, MD;  Location: [facility] OR;  Service: Open Heart Surgery;  Laterality: N/A;   LEFT HEART CATH AND CORONARY ANGIOGRAPHY N/A 02/03/2018   Procedure: LEFT HEART CATH AND CORONARY ANGIOGRAPHY;  Surgeon: Swaziland, Peter M, MD;  Location: [facility] INVASIVE CV LAB;  Service: Cardiovascular;  Laterality: N/A;   LEFT HEART CATH AND CORS/GRAFTS ANGIOGRAPHY N/A 02/12/2019   Procedure: LEFT HEART CATH AND CORS/GRAFTS ANGIOGRAPHY;  Surgeon: Wenona Hamilton, MD;  Location: [facility] INVASIVE CV LAB;  Service: Cardiovascular;  Laterality: N/A;   MEDIASTINOSCOPY N/A 03/11/2014   Procedure: MEDIASTINOSCOPY;  Surgeon: Zelphia Higashi, MD;  Location: [facility] OR;  Service: Thoracic;  Laterality: N/A;   PLACEMENT OF IMPELLA LEFT VENTRICULAR ASSIST DEVICE  02/03/2018   Procedure: PLACEMENT OF IMPELLA LEFT VENTRICULAR ASSIST DEVICE LD;  Surgeon: Gardenia Jump, MD;  Location: [facility] OR;  Service: Open Heart Surgery;;   REMOVAL OF IMPELLA LEFT VENTRICULAR ASSIST DEVICE N/A 02/08/2018   Procedure: REMOVAL OF IMPELLA LEFT VENTRICULAR ASSIST DEVICE;  Surgeon: Gardenia Jump, MD;  Location: [facility] OR;  Service: Open Heart Surgery;  Laterality: N/A;   RIGHT HEART CATH N/A 02/03/2018   Procedure: RIGHT HEART CATH;  Surgeon: Swaziland, Peter M, MD;  Location: [facility] INVASIVE CV LAB;  Service: Cardiovascular;  Laterality: N/A;   RIGHT HEART CATH N/A 05/09/2018    Procedure: RIGHT HEART CATH;  Surgeon: Mardell Shade, MD;  Location: [facility] INVASIVE CV LAB;  Service: Cardiovascular;  Laterality: N/A;   TEE WITHOUT CARDIOVERSION N/A 02/08/2018   Procedure: TRANSESOPHAGEAL ECHOCARDIOGRAM (TEE);  Surgeon: Gardenia Jump, MD;  Location: [facility] OR;  Service: Open Heart Surgery;  Laterality: N/A;   TUBAL LIGATION     VIDEO BRONCHOSCOPY WITH ENDOBRONCHIAL ULTRASOUND N/A 03/11/2014   Procedure: VIDEO BRONCHOSCOPY WITH ENDOBRONCHIAL ULTRASOUND;  Surgeon: Zelphia Higashi, MD;  Location: [facility] OR;  Service: Thoracic;  Laterality: N/A;   Current Outpatient Medications  Medication Sig Dispense Refill   atorvastatin  (LIPITOR ) 80 MG tablet Take 1 tablet (80 mg total) by mouth daily. 90 tablet 0   cyclobenzaprine  (FLEXERIL ) 10 MG tablet Take 1 tablet (10 mg total) by mouth 3 (three) times daily as needed for muscle spasms. 60 tablet 3   dapagliflozin  propanediol (FARXIGA ) 10 MG TABS tablet Take 1 tablet (10 mg total) by mouth daily. 90 tablet 3   digoxin  (LANOXIN ) 0.125 MG tablet Take 1 tablet (125 mcg total) by mouth at bedtime. 90 tablet 3   ivabradine  (CORLANOR ) 7.5 MG TABS tablet Take 1 tablet (7.5 mg total) by mouth 2 (two) times daily with a meal. 60 tablet 6   megestrol  (MEGACE ) 20 MG tablet Take  1 tablet (20 mg total) by mouth 2 (two) times daily. 60 tablet 1   metolazone  (ZAROXOLYN ) 2.5 MG tablet Take 1 tablet (2.5 mg total) by mouth as needed (as instructed by Advanced HF clinic). with 40 mEq of Potassium 3 times per day on metolazone  days 10 tablet 5   Multiple Vitamin (MULTIVITAMIN WITH MINERALS) TABS tablet Take 1 tablet by mouth daily.     nitroGLYCERIN  (NITROSTAT ) 0.4 MG SL tablet Place 1 tablet (0.4 mg total) under the tongue every 5 (five) minutes as needed for chest pain. 90 tablet 0   Nutritional Supplements (ENSURE ORIGINAL) LIQD Take 1 Bottle by mouth 2 (two) times daily after a meal. Drink 1 bottle 2 times daily.     potassium chloride  (KLOR-CON ) 20 MEQ  packet Dissolve two (40mEq total) packets in 4-6oz of fluid and take by mouth daily but take it three times a day when taking Metolazone  2.5mg . 30 packet 11   spironolactone  (ALDACTONE ) 50 MG tablet Take 1 tablet (50 mg total) by mouth daily. 90 tablet 3   torsemide  (DEMADEX ) 100 MG tablet Take 1 tablet (100 mg total) by mouth 2 (two) times daily. 60 tablet 3   albuterol  (VENTOLIN  HFA) 108 (90 Base) MCG/ACT inhaler Inhale 2 puffs into the lungs every 6 (six) hours as needed for wheezing or shortness of breath. (Patient not taking: Reported on 04/18/2024) 8.5 g 1   ELIQUIS  2.5 MG TABS tablet TAKE 1 TABLET BY MOUTH TWICE A DAY 180 tablet 1   Fluticasone -Umeclidin-Vilant (TRELEGY ELLIPTA ) 200-62.5-25 MCG/ACT AEPB Inhale 1 puff into the lungs daily. (Patient not taking: Reported on 04/18/2024) 60 each 0   hydrOXYzine  (VISTARIL ) 25 MG capsule TAKE 1 CAPSULE (25 MG TOTAL) BY MOUTH DAILY AS NEEDED. (Patient taking differently: Take 25 mg by mouth daily as needed for anxiety.) 90 capsule 1   ipratropium-albuterol  (DUONEB) 0.5-2.5 (3) MG/3ML SOLN TAKE 3 MLS BY NEBULIZATION EVERY 4 (FOUR) HOURS AS NEEDED (FOR SHORTNESS OF BREATH). (Patient not taking: Reported on 04/18/2024) 90 mL 1   No current facility-administered medications for this encounter.    Allergies:   Lactose intolerance (gi) and Codeine   Social History:  The patient  reports that she quit smoking about 10 years ago. Her smoking use included cigarettes. She started smoking about 30 years ago. She has a 20 pack-year smoking history. She has never used smokeless tobacco. She reports current alcohol use of about 5.0 standard drinks of alcohol per week. She reports current drug use. Drug: Marijuana.   Family History:  The patient's family history includes Cancer in her maternal grandmother; Diabetes in her paternal grandmother; Heart attack in her mother; Heart disease in her mother; Hypertension in her maternal grandmother and mother.   ROS:  Please see  the history of present illness.   All other systems are personally reviewed and negative.    Wt Readings from Last 3 Encounters:  04/18/24 45.1 kg (99 lb 6.4 oz)  04/15/24 44 kg (97 lb)  04/07/24 41.8 kg (92 lb 2.4 oz)   BP 104/70   Pulse 94   Wt 45.1 kg (99 lb 6.4 oz)   SpO2 99%   BMI 19.74 kg/m   Physical Exam General:  Weak appearing. + audible wheezing  HEENT: normal Neck: supple. JVP to jaw Carotids 2+ bilat; no bruits. No lymphadenopathy or thryomegaly appreciated. Cor: PMI nondisplaced. Regular rate & rhythm. No rubs, gallops or murmurs. Lungs diffuse crackles and wheeze  Abdomen: soft, nontender, nondistended. No hepatosplenomegaly.  No bruits or masses. Good bowel sounds. Extremities: no cyanosis, clubbing, rash, edema Neuro: alert & orientedx3, cranial nerves grossly intact. moves all 4 extremities w/o difficulty. Affect pleasant   Device interrogation (personally reviewed): No VT/AF Optivol way up Personally reviewed   Assessment & Plan: 1. End-stage systolic Heart Failure - EF has been 20-25% for the last 5 years.  - S/p Medtronic ICD and Barostim - Echo 1/25: EF < 20%, mod to severely reduced RV (in setting of RSV) - Numerous hospitalizations over past 18 months  - NYHAIIIB-IV - GDMT limited by need for BP support with midodrine  - Continue midodrine  5 mg tid. BP too low to wean. - Continue metolazone  2.5 mg + 40 KCL tid once weekly (Tuesdays) - Continue torsemide  100 mg bid  - Continue Farxiga  10 mg daily. - Continue digoxin  0.125 mg daily.  - Continue ivabradine  7.5 mg bid. - Continue spironolactone  55 mg daily.  - Not a candidate for advanced therapies due to severity of lung disease.  - She is enrolled in monthly iCM  2. Chronic Hypoxic Respiratory Failure/ AECOPD  - Hx frequent admissions for AE COPD + A/C HFrEF.  - No longer on oxygen. - Continue inhalers.   3. CKD Stage IIIb  - Creatinine baseline ~ 1.4-1.7 - Continue SGLT2i - BMET today   4.  PAF  - Regular on exam today. - Continue Eliquis  2.5 mg bid. Reduced dose (weight/creatinine).   5. CAD - History of CABG x 2 2019 - s/p DES ostial ramus extending to left main (2020) - No s/s angina - Continue atorvastatin  80 mg daily - No ASA with need for AC   6. H/o SCLC - Completed treatment 2015.  - CT chest (5/24) soft tissue thickening along mediastinum but no discrete mass    7. H/o Renal Infarct 2022 - On Eliquis . No bleeding issues.  8. GOC/FTT - She has had a significant functional decline. - She is DNR/DNI   Jules Oar, MD 04/18/24

## 2024-04-18 NOTE — Progress Notes (Signed)
 Medication Samples have been provided to the patient.  Drug name: Furoscix        Strength: 80mg         Qty: 2 boxes  LOT: [id number]  Exp.Date: 04/09/25  Dosing instructions: take as directed by HF clinic  The patient has been instructed regarding the correct time, dose, and frequency of taking this medication, including desired effects and most common side effects.   Lando Alcalde B Haile Toppins 11:37 AM 04/18/2024

## 2024-04-21 ENCOUNTER — Telehealth (HOSPITAL_COMMUNITY): Payer: Self-pay

## 2024-04-21 NOTE — Telephone Encounter (Signed)
 Called to confirm/remind patient of their appointment at the Advanced [facility] Failure Clinic on 04/22/2024 10:30.   Appointment:   [] Confirmed  [] Left mess   [] No answer/No voice mail  [] VM Full/unable to leave message  [] Phone not in service  Patient reminded to bring all medications and/or complete list.  Confirmed patient has transportation. Gave directions, instructed to utilize valet parking.

## 2024-04-21 NOTE — Progress Notes (Incomplete)
 Advanced Heart Failure Clinic Note  PCP:  Abraham Abo, MD  Oncology: Dr Liam Redhead  HF Cardiologist: Dr Julane Ny  Reason for Visit: Heart Failure Follow-up  HPI: Brittany [name]  is a 63 y.o. female with a history of PAF, CAD s/p emergent CABG in 2019 and DES in 2020, R renal infarct in 2022,  previous smoker quit in 2015, hypertension, previous small cell lung cancer treated with chemo, chest XRT and prophylactic brain radiation in 2015, and chronic systolic HF EF ~25%  Admitted 03/980 with NSTEMI and shock. Underwent emergent cath 02/03/18 showed LAD 100% stenosed, LCx 95% stenosed. Taken for emergent CABG 02/03/18. Required impella post op. Hospital course complicated by cardiogenic shock, HCAP, A fib, respiratory failure, and swallowing issues. She was discharged to SNF. Discharge weight 103 pounds.    In 2019 had multiple hospitalizations for HF and pleural effusion. Underwent pleurodesis at [facility].   Admitted 3/20 with NSTEMI and HF. Received DES to ostial ramus into distal left main based on cath below and underwent diuresis. Meds adjusted as tolerated. Echo with EF 25-30%.    Admitted twice in 2023 with low output requiring milrinone . Barostim placed.   Multiple CHF and AECOPD admission in May, August, October of 2024 and Janurary 2025. Echo 1/25 EF  < 20%, G2DD, RV moderately to severely reduced. She remained DNR.  Admitted  3/6-3/10/2024 for CHF and AECOPD. Readmitted for CHF/COPD. Was hypotensive. Moved to ICU. Required DBA and IV diuresis. Refused [facility] services.   Today she returns for AHF follow up, she was volume overloaded at clinic visit last week. She was given furoscix  and metolazone . Overall feeling ***. Denies palpitations, CP, dizziness, edema, or PND/Orthopnea. *** SOB. Appetite ok. No fever or chills. Weight at home *** pounds. Taking all medications. Denies ETOH, tobacco or drug use.     Past Medical History:  Diagnosis Date   Acute  respiratory failure (HCC) 05/05/2018   Acute systolic congestive heart failure (HCC) 02/03/2018   AICD (automatic cardioverter/defibrillator) present    Allergy    Anxiety    Asthma    DM2 (diabetes mellitus, type 2) (HCC) 10/20/2020   Hypertension    PAF (paroxysmal atrial fibrillation) (HCC)    Presence of permanent cardiac pacemaker    Prophylactic measure 08/03/14-08/19/14   Prophyl. 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   LIMA to LAD, SVG to D1, SVG to OM1, EVH via right thigh with implantation of Impella LD LVAD via direct aortic approach   Small cell lung cancer (HCC) 03/16/2014   Past Surgical History:  Procedure Laterality Date   BRONCHIAL BRUSHINGS  10/25/2020   Procedure: BRONCHIAL BRUSHINGS;  Surgeon: Denson Flake, MD;  Location: [facility] ENDOSCOPY;  Service: Pulmonary;;   BRONCHIAL NEEDLE ASPIRATION BIOPSY  10/25/2020   Procedure: BRONCHIAL NEEDLE ASPIRATION BIOPSIES;  Surgeon: Denson Flake, MD;  Location: [facility] ENDOSCOPY;  Service: Pulmonary;;   CARDIAC DEFIBRILLATOR PLACEMENT  08/15/2018   MDT Visia AF MRI VR ICD implanted by Dr Dolly Fret for primary prevention of sudden   CESAREAN SECTION     CORONARY ARTERY BYPASS GRAFT N/A 02/03/2018   Procedure: CORONARY ARTERY BYPASS GRAFTING (CABG);  Surgeon: Gardenia Jump, MD;  Location: [facility] OR;  Service: Open Heart Surgery;  Laterality: N/A;  Time 3 using left internal mammary artery and endoscopically harvested right saphenous vein   CORONARY BALLOON ANGIOPLASTY N/A 02/03/2018   Procedure: CORONARY BALLOON ANGIOPLASTY;  Surgeon: Swaziland, Peter M, MD;  Location: [facility] INVASIVE CV LAB;  Service: Cardiovascular;  Laterality: N/A;   CORONARY STENT INTERVENTION N/A 02/12/2019   Procedure: CORONARY STENT INTERVENTION;  Surgeon: Wenona Hamilton, MD;  Location: [facility] INVASIVE CV LAB;  Service: Cardiovascular;  Laterality: N/A;   CORONARY/GRAFT ACUTE MI REVASCULARIZATION N/A 02/03/2018   Procedure: Coronary/Graft Acute MI  Revascularization;  Surgeon: Swaziland, Peter M, MD;  Location: [facility] INVASIVE CV LAB;  Service: Cardiovascular;  Laterality: N/A;   ENDOBRONCHIAL ULTRASOUND N/A 10/25/2020   Procedure: ENDOBRONCHIAL ULTRASOUND;  Surgeon: Denson Flake, MD;  Location: [facility] ENDOSCOPY;  Service: Pulmonary;  Laterality: N/A;   FLEXIBLE BRONCHOSCOPY  10/25/2020   Procedure: FLEXIBLE BRONCHOSCOPY;  Surgeon: Denson Flake, MD;  Location: [facility] ENDOSCOPY;  Service: Pulmonary;;   IABP INSERTION N/A 02/03/2018   Procedure: IABP Insertion;  Surgeon: Swaziland, Peter M, MD;  Location: [facility] INVASIVE CV LAB;  Service: Cardiovascular;  Laterality: N/A;   INTRAOPERATIVE TRANSESOPHAGEAL ECHOCARDIOGRAM N/A 02/03/2018   Procedure: INTRAOPERATIVE TRANSESOPHAGEAL ECHOCARDIOGRAM;  Surgeon: Gardenia Jump, MD;  Location: [facility] OR;  Service: Open Heart Surgery;  Laterality: N/A;   LEFT HEART CATH AND CORONARY ANGIOGRAPHY N/A 02/03/2018   Procedure: LEFT HEART CATH AND CORONARY ANGIOGRAPHY;  Surgeon: Swaziland, Peter M, MD;  Location: [facility] INVASIVE CV LAB;  Service: Cardiovascular;  Laterality: N/A;   LEFT HEART CATH AND CORS/GRAFTS ANGIOGRAPHY N/A 02/12/2019   Procedure: LEFT HEART CATH AND CORS/GRAFTS ANGIOGRAPHY;  Surgeon: Wenona Hamilton, MD;  Location: [facility] INVASIVE CV LAB;  Service: Cardiovascular;  Laterality: N/A;   MEDIASTINOSCOPY N/A 03/11/2014   Procedure: MEDIASTINOSCOPY;  Surgeon: Zelphia Higashi, MD;  Location: [facility] OR;  Service: Thoracic;  Laterality: N/A;   PLACEMENT OF IMPELLA LEFT VENTRICULAR ASSIST DEVICE  02/03/2018   Procedure: PLACEMENT OF IMPELLA LEFT VENTRICULAR ASSIST DEVICE LD;  Surgeon: Gardenia Jump, MD;  Location: [facility] OR;  Service: Open Heart Surgery;;   REMOVAL OF IMPELLA LEFT VENTRICULAR ASSIST DEVICE N/A 02/08/2018   Procedure: REMOVAL OF IMPELLA LEFT VENTRICULAR ASSIST DEVICE;  Surgeon: Gardenia Jump, MD;  Location: [facility] OR;  Service: Open Heart Surgery;  Laterality: N/A;   RIGHT HEART CATH N/A 02/03/2018   Procedure: RIGHT  HEART CATH;  Surgeon: Swaziland, Peter M, MD;  Location: [facility] INVASIVE CV LAB;  Service: Cardiovascular;  Laterality: N/A;   RIGHT HEART CATH N/A 05/09/2018   Procedure: RIGHT HEART CATH;  Surgeon: Mardell Shade, MD;  Location: [facility] INVASIVE CV LAB;  Service: Cardiovascular;  Laterality: N/A;   TEE WITHOUT CARDIOVERSION N/A 02/08/2018   Procedure: TRANSESOPHAGEAL ECHOCARDIOGRAM (TEE);  Surgeon: Gardenia Jump, MD;  Location: [facility] OR;  Service: Open Heart Surgery;  Laterality: N/A;   TUBAL LIGATION     VIDEO BRONCHOSCOPY WITH ENDOBRONCHIAL ULTRASOUND N/A 03/11/2014   Procedure: VIDEO BRONCHOSCOPY WITH ENDOBRONCHIAL ULTRASOUND;  Surgeon: Zelphia Higashi, MD;  Location: [facility] OR;  Service: Thoracic;  Laterality: N/A;   Current Outpatient Medications  Medication Sig Dispense Refill   albuterol  (VENTOLIN  HFA) 108 (90 Base) MCG/ACT inhaler Inhale 2 puffs into the lungs every 6 (six) hours as needed for wheezing or shortness of breath. (Patient not taking: Reported on 04/18/2024) 8.5 g 1   atorvastatin  (LIPITOR ) 80 MG tablet Take 1 tablet (80 mg total) by mouth daily. 90 tablet 0   cyclobenzaprine  (FLEXERIL ) 10 MG tablet Take 1 tablet (10 mg total) by mouth 3 (three) times daily as needed for muscle spasms. 60 tablet 3   dapagliflozin  propanediol (FARXIGA ) 10 MG TABS tablet Take 1 tablet (  10 mg total) by mouth daily. 90 tablet 3   digoxin  (LANOXIN ) 0.125 MG tablet Take 1 tablet (125 mcg total) by mouth at bedtime. 90 tablet 3   ELIQUIS  2.5 MG TABS tablet TAKE 1 TABLET BY MOUTH TWICE A DAY 180 tablet 1   Fluticasone -Umeclidin-Vilant (TRELEGY ELLIPTA ) 200-62.5-25 MCG/ACT AEPB Inhale 1 puff into the lungs daily. (Patient not taking: Reported on 04/18/2024) 60 each 0   Furosemide  (FUROSCIX ) 80 MG/10ML CTKT Inject 80 mg into the skin as directed.     hydrOXYzine  (VISTARIL ) 25 MG capsule TAKE 1 CAPSULE (25 MG TOTAL) BY MOUTH DAILY AS NEEDED. (Patient taking differently: Take 25 mg by mouth daily as needed for  anxiety.) 90 capsule 1   ipratropium-albuterol  (DUONEB) 0.5-2.5 (3) MG/3ML SOLN TAKE 3 MLS BY NEBULIZATION EVERY 4 (FOUR) HOURS AS NEEDED (FOR SHORTNESS OF BREATH). (Patient not taking: Reported on 04/18/2024) 90 mL 1   ivabradine  (CORLANOR ) 7.5 MG TABS tablet Take 1 tablet (7.5 mg total) by mouth 2 (two) times daily with a meal. 60 tablet 6   megestrol  (MEGACE ) 20 MG tablet Take 1 tablet (20 mg total) by mouth 2 (two) times daily. 60 tablet 1   metolazone  (ZAROXOLYN ) 5 MG tablet Take 1 tablet (5 mg total) by mouth as needed (as instructed by Advanced HF clinic). 10 tablet 3   Multiple Vitamin (MULTIVITAMIN WITH MINERALS) TABS tablet Take 1 tablet by mouth daily.     nitroGLYCERIN  (NITROSTAT ) 0.4 MG SL tablet Place 1 tablet (0.4 mg total) under the tongue every 5 (five) minutes as needed for chest pain. 90 tablet 0   Nutritional Supplements (ENSURE ORIGINAL) LIQD Take 1 Bottle by mouth 2 (two) times daily after a meal. Drink 1 bottle 2 times daily.     potassium chloride  (KLOR-CON ) 20 MEQ packet Take 40 mEq by mouth daily. Take an extra 60 meq (3 packets) when you take Metolazone  30 packet 11   spironolactone  (ALDACTONE ) 50 MG tablet Take 1 tablet (50 mg total) by mouth daily. 90 tablet 3   torsemide  (DEMADEX ) 100 MG tablet Take 1 tablet (100 mg total) by mouth 2 (two) times daily. 60 tablet 3   No current facility-administered medications for this visit.    Allergies:   Lactose intolerance (gi) and Codeine   Social History:  The patient  reports that she quit smoking about 10 years ago. Her smoking use included cigarettes. She started smoking about 30 years ago. She has a 20 pack-year smoking history. She has never used smokeless tobacco. She reports current alcohol use of about 5.0 standard drinks of alcohol per week. She reports current drug use. Drug: Marijuana.   Family History:  The patient's family history includes Cancer in her maternal grandmother; Diabetes in her paternal grandmother;  Heart attack in her mother; Heart disease in her mother; Hypertension in her maternal grandmother and mother.   ROS:  Please see the history of present illness.   All other systems are personally reviewed and negative.    Wt Readings from Last 3 Encounters:  04/18/24 45.1 kg (99 lb 6.4 oz)  04/15/24 44 kg (97 lb)  04/07/24 41.8 kg (92 lb 2.4 oz)   There were no vitals taken for this visit.  Physical Exam General:  *** appearing.  No respiratory difficulty HEENT: normal Neck: supple. JVD *** cm.  Cor: PMI nondisplaced. Regular rate & rhythm. No rubs, gallops or murmurs. Lungs: clear Abdomen: soft, nontender, nondistended. Good bowel sounds. Extremities: no cyanosis, clubbing, rash, edema  Neuro: alert & oriented x 3. Moves all 4 extremities w/o difficulty. Affect pleasant.   Device interrogation (personally reviewed): No VT/AF Optivol way up   Assessment & Plan: 1. End-stage acute on chronic systolic Heart Failure - EF has been 20-25% for the last 5 years.  - S/p Medtronic ICD and Barostim - Echo 1/25: EF < 20%, mod to severely reduced RV (in setting of RSV) - Numerous hospitalizations over past 18 months  - Worse today NYHA IV. Volume overloaded *** - GDMT limited by need for BP support with midodrine  - Will give furoscix  with 2.5 mg metolazone  and kcl 60 for 2 days in am with torsemide  100 in afternoon *** - Will see back Monday to reasess, Will likely increase metolazone  for 1x/week to 3x/wee (MWF) with torsemide  100bid *** - Continue midodrine  5 mg tid.  - Continue Farxiga  10 mg daily. - Continue digoxin  0.125 mg daily.  - Continue ivabradine  7.5 mg bid. - Continue spironolactone  50 mg daily.  - Not a candidate for advanced therapies due to severity of lung disease.  - Labs today  2. Acute on chronic Hypoxic Respiratory Failure/ AECOPD  - Hx frequent admissions for AE COPD + A/C HFrEF.  - No longer on oxygen. - Continue inhalers. - diurese as above   3. CKD Stage  IIIb  - Creatinine baseline ~ 1.4-1.7 - Continue SGLT2i - Labs today   4. PAF  - In sinus today - Continue Eliquis  2.5 mg bid. Reduced dose (weight/creatinine).   5. CAD - History of CABG x 2 2019 - s/p DES ostial ramus extending to left main (2020) - No s/s angina - Continue atorvastatin  80 mg daily - No ASA with need for AC   6. H/o SCLC - Completed treatment 2015.  - CT chest (5/24) soft tissue thickening along mediastinum but no discrete mass    7. H/o Renal Infarct 2022 - On Eliquis . No bleeding issues.  8. GOC/FTT - long talk about role of Hospice Care for support to prevent suffering.if she continues to deteriorate -> she is not interested in this - She is DNR/DNI  Follow up ***  Sheryl Donna, NP 04/21/24

## 2024-04-22 ENCOUNTER — Encounter (HOSPITAL_COMMUNITY)

## 2024-04-28 ENCOUNTER — Ambulatory Visit (HOSPITAL_COMMUNITY): Payer: Self-pay | Admitting: Internal Medicine

## 2024-04-29 NOTE — Telephone Encounter (Signed)
 Called patient per Dr. Julane Ny with following:  "Please repeat BMET and BNP"  Pt says she has appointment with us  on 6/3 and wishes to get labs then. Added lab request to appointment note.

## 2024-05-06 ENCOUNTER — Ambulatory Visit: Attending: Cardiology

## 2024-05-06 DIAGNOSIS — I5022 Chronic systolic (congestive) heart failure: Secondary | ICD-10-CM

## 2024-05-06 DIAGNOSIS — Z95810 Presence of automatic (implantable) cardiac defibrillator: Secondary | ICD-10-CM

## 2024-05-09 NOTE — Progress Notes (Signed)
 EPIC Encounter for ICM Monitoring  Patient Name: Brittany [name]  is a 63 y.o. female Date: 05/09/2024 Primary Care Physican: Abraham Abo, MD Primary Cardiologist: Bensimhon Electrophysiologist: Marven Slimmer 10/23/2023 Weight: 93-94 lbs 11/13/2023 Weight: 94 lbs  01/03/2024 Weight: 93-94 lbs  01/22/2024 Weight: 91 lbs (lowest weight 84 lbs) 04/18/2024 Office Weight:  99 lbs   Spoke with patient and heart failure questions reviewed.  Transmission results reviewed.  Pt asymptomatic for fluid accumulation.  She is doing well since she started Furoscix  and does not feel like she has any fluid symptoms.   Dr Julane Ny ordered 2 days of Furoscix  metolazone  and extra Potassium at 5/9 OV which correlates with Optivol.   Optivol Thoracic impedance suggesting possible dryness starting 5/9 after being diuresed with 2 days of Furoscix  but returned to baseline 5/30.    Prescribed: Torsemide  20 mg take 5 tablets (100 mg total) by mouth two times a day.  Potassium 20 mEq packet take 40 mEq total by mouth daily.  Take extra 60 mEq (3 packets) when you take Metolazone .   Spironolactone  50 mg take 1 tablet (50 mg total) by mouth daily Metolazone  5 mg take 1 tablet by mouth as instructed by HF clinic   Labs: 04/18/2024 Creatinine 1.20, BUN 16, Potassium 3.2, Sodium 139, GFR 51  04/07/2024 Creatinine 1.70, BUN 60, Potassium 4.3, Sodium 135, GFR 33  04/06/2024 Creatinine 1.44, BUN 60, Potassium 2.9, Sodium 133, GFR 41  04/05/2024 Creatinine 1.58, BUN 65, Potassium 3.5, Sodium 134, GFR 37 04/04/2024 Creatinine 1.69, BUN 53, Potassium 3.5, Sodium 133, GFR 34  04/03/2024 Creatinine 1.49, BUN 43, Potassium 4.5, Sodium 139, GFR 39  A complete set of results can be found in Results Review.   Recommendations:   No changes and encouraged to call if experiencing any fluid symptoms.   Follow-up plan: ICM clinic phone appointment on 05/26/2024 to recheck fluid levels for stability.   91 day device clinic remote  transmission 06/30/2024.     EP/Cardiology Office Visits:  05/13/2024 with HF Clinic.  Recall 07/26/2024 with Michaelle Adolphus, PA   Copy of ICM check sent to Dr. Marven Slimmer.  3 month ICM trend: 05/09/2024.    12-14 Month ICM trend:     Almyra Jain, RN 05/09/2024 8:57 AM

## 2024-05-12 ENCOUNTER — Telehealth (HOSPITAL_COMMUNITY): Payer: Self-pay

## 2024-05-12 NOTE — Progress Notes (Incomplete)
 Advanced Heart Failure Clinic Note  PCP:  Abraham Abo, MD  Oncology: Dr Liam Redhead  HF Cardiologist: Dr Julane Ny  Reason for Visit: Heart Failure Follow-up  HPI: Brittany [name]  is a 63 y.o. female with a history of PAF, CAD s/p emergent CABG in 2019 and DES in 2020, R renal infarct in 2022,  previous smoker quit in 2015, hypertension, previous small cell lung cancer treated with chemo, chest XRT and prophylactic brain radiation in 2015, and chronic systolic HF EF ~25%  Admitted 04/6212 with NSTEMI and shock. Underwent emergent cath 02/03/18 showed LAD 100% stenosed, LCx 95% stenosed. Taken for emergent CABG 02/03/18. Required impella post op. Hospital course complicated by cardiogenic shock, HCAP, A fib, respiratory failure, and swallowing issues. She was discharged to SNF. Discharge weight 103 pounds.    In 2019 had multiple hospitalizations for HF and pleural effusion. Underwent pleurodesis at [facility].   Admitted 3/20 with NSTEMI and HF. Received DES to ostial ramus into distal left main based on cath below and underwent diuresis. Meds adjusted as tolerated. Echo with EF 25-30%.    Admitted twice in 2023 with low output requiring milrinone . Barostim placed.   Multiple CHF and AECOPD admission in May, August, October of 2024 and Janurary 2025. Echo 1/25 EF  < 20%, G2DD, RV moderately to severely reduced. She remained DNR.  Admitted  3/6-3/10/2024 for CHF and AECOPD. Readmitted for CHF/COPD. Was hypotensive. Moved to ICU. Required DBA and IV diuresis. Refused [facility] services.   Today she returns for AHF follow up, she was volume overloaded at clinic visit last week. She was given furoscix  and metolazone . Overall feeling ***. Denies palpitations, CP, dizziness, edema, or PND/Orthopnea. *** SOB. Appetite ok. No fever or chills. Weight at home *** pounds. Taking all medications. Denies ETOH, tobacco or drug use.     Past Medical History:  Diagnosis Date   Acute  respiratory failure (HCC) 05/05/2018   Acute systolic congestive heart failure (HCC) 02/03/2018   AICD (automatic cardioverter/defibrillator) present    Allergy    Anxiety    Asthma    DM2 (diabetes mellitus, type 2) (HCC) 10/20/2020   Hypertension    PAF (paroxysmal atrial fibrillation) (HCC)    Presence of permanent cardiac pacemaker    Prophylactic measure 08/03/14-08/19/14   Prophyl. 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   LIMA to LAD, SVG to D1, SVG to OM1, EVH via right thigh with implantation of Impella LD LVAD via direct aortic approach   Small cell lung cancer (HCC) 03/16/2014   Past Surgical History:  Procedure Laterality Date   BRONCHIAL BRUSHINGS  10/25/2020   Procedure: BRONCHIAL BRUSHINGS;  Surgeon: Denson Flake, MD;  Location: [facility] ENDOSCOPY;  Service: Pulmonary;;   BRONCHIAL NEEDLE ASPIRATION BIOPSY  10/25/2020   Procedure: BRONCHIAL NEEDLE ASPIRATION BIOPSIES;  Surgeon: Denson Flake, MD;  Location: [facility] ENDOSCOPY;  Service: Pulmonary;;   CARDIAC DEFIBRILLATOR PLACEMENT  08/15/2018   MDT Visia AF MRI VR ICD implanted by Dr Dolly Fret for primary prevention of sudden   CESAREAN SECTION     CORONARY ARTERY BYPASS GRAFT N/A 02/03/2018   Procedure: CORONARY ARTERY BYPASS GRAFTING (CABG);  Surgeon: Gardenia Jump, MD;  Location: [facility] OR;  Service: Open Heart Surgery;  Laterality: N/A;  Time 3 using left internal mammary artery and endoscopically harvested right saphenous vein   CORONARY BALLOON ANGIOPLASTY N/A 02/03/2018   Procedure: CORONARY BALLOON ANGIOPLASTY;  Surgeon: Swaziland, Peter M, MD;  Location: [facility] INVASIVE CV LAB;  Service: Cardiovascular;  Laterality: N/A;   CORONARY STENT INTERVENTION N/A 02/12/2019   Procedure: CORONARY STENT INTERVENTION;  Surgeon: Wenona Hamilton, MD;  Location: [facility] INVASIVE CV LAB;  Service: Cardiovascular;  Laterality: N/A;   CORONARY/GRAFT ACUTE MI REVASCULARIZATION N/A 02/03/2018   Procedure: Coronary/Graft Acute MI  Revascularization;  Surgeon: [place]aziland, Peter M, MD;  Location: [facility] INVASIVE CV LAB;  Service: Cardiovascular;  Laterality: N/A;   ENDOBRONCHIAL ULTRASOUND N/A 10/25/2020   Procedure: ENDOBRONCHIAL ULTRASOUND;  Surgeon: Denson Flake, MD;  Location: [facility] ENDOSCOPY;  Service: Pulmonary;  Laterality: N/A;   FLEXIBLE BRONCHOSCOPY  10/25/2020   Procedure: FLEXIBLE BRONCHOSCOPY;  Surgeon: Denson Flake, MD;  Location: [facility] ENDOSCOPY;  Service: Pulmonary;;   IABP INSERTION N/A 02/03/2018   Procedure: IABP Insertion;  Surgeon: [place]aziland, Peter M, MD;  Location: [facility] INVASIVE CV LAB;  Service: Cardiovascular;  Laterality: N/A;   INTRAOPERATIVE TRANSESOPHAGEAL ECHOCARDIOGRAM N/A 02/03/2018   Procedure: INTRAOPERATIVE TRANSESOPHAGEAL ECHOCARDIOGRAM;  Surgeon: Gardenia Jump, MD;  Location: [facility] OR;  Service: Open Heart Surgery;  Laterality: N/A;   LEFT HEART CATH AND CORONARY ANGIOGRAPHY N/A 02/03/2018   Procedure: LEFT HEART CATH AND CORONARY ANGIOGRAPHY;  Surgeon: Swaziland, Peter M, MD;  Location: [facility] INVASIVE CV LAB;  Service: Cardiovascular;  Laterality: N/A;   LEFT HEART CATH AND CORS/GRAFTS ANGIOGRAPHY N/A 02/12/2019   Procedure: LEFT HEART CATH AND CORS/GRAFTS ANGIOGRAPHY;  Surgeon: Wenona Hamilton, MD;  Location: [facility] INVASIVE CV LAB;  Service: Cardiovascular;  Laterality: N/A;   MEDIASTINOSCOPY N/A 03/11/2014   Procedure: MEDIASTINOSCOPY;  Surgeon: Zelphia Higashi, MD;  Location: [facility] OR;  Service: Thoracic;  Laterality: N/A;   PLACEMENT OF IMPELLA LEFT VENTRICULAR ASSIST DEVICE  02/03/2018   Procedure: PLACEMENT OF IMPELLA LEFT VENTRICULAR ASSIST DEVICE LD;  Surgeon: Gardenia Jump, MD;  Location: [facility] OR;  Service: Open Heart Surgery;;   REMOVAL OF IMPELLA LEFT VENTRICULAR ASSIST DEVICE N/A 02/08/2018   Procedure: REMOVAL OF IMPELLA LEFT VENTRICULAR ASSIST DEVICE;  Surgeon: Gardenia Jump, MD;  Location: [facility] OR;  Service: Open Heart Surgery;  Laterality: N/A;   RIGHT HEART CATH N/A 02/03/2018   Procedure: RIGHT  HEART CATH;  Surgeon: [place]aziland, Peter M, MD;  Location: [facility] INVASIVE CV LAB;  Service: Cardiovascular;  Laterality: N/A;   RIGHT HEART CATH N/A 05/09/2018   Procedure: RIGHT HEART CATH;  Surgeon: Mardell Shade, MD;  Location: [facility] INVASIVE CV LAB;  Service: Cardiovascular;  Laterality: N/A;   TEE WITHOUT CARDIOVERSION N/A 02/08/2018   Procedure: TRANSESOPHAGEAL ECHOCARDIOGRAM (TEE);  Surgeon: Gardenia Jump, MD;  Location: [facility] OR;  Service: Open Heart Surgery;  Laterality: N/A;   TUBAL LIGATION     VIDEO BRONCHOSCOPY WITH ENDOBRONCHIAL ULTRASOUND N/A 03/11/2014   Procedure: VIDEO BRONCHOSCOPY WITH ENDOBRONCHIAL ULTRASOUND;  Surgeon: Zelphia Higashi, MD;  Location: [facility] OR;  Service: Thoracic;  Laterality: N/A;   Current Outpatient Medications  Medication Sig Dispense Refill   albuterol  (VENTOLIN  HFA) 108 (90 Base) MCG/ACT inhaler Inhale 2 puffs into the lungs every 6 (six) hours as needed for wheezing or shortness of breath. (Patient not taking: Reported on 04/18/2024) 8.5 g 1   atorvastatin  (LIPITOR ) 80 MG tablet Take 1 tablet (80 mg total) by mouth daily. 90 tablet 0   cyclobenzaprine  (FLEXERIL ) 10 MG tablet Take 1 tablet (10 mg total) by mouth 3 (three) times daily as needed for muscle spasms. 60 tablet 3   dapagliflozin  propanediol (FARXIGA ) 10 MG TABS tablet Take 1 tablet (  10 mg total) by mouth daily. 90 tablet 3   digoxin  (LANOXIN ) 0.125 MG tablet Take 1 tablet (125 mcg total) by mouth at bedtime. 90 tablet 3   ELIQUIS  2.5 MG TABS tablet TAKE 1 TABLET BY MOUTH TWICE A DAY 180 tablet 1   Fluticasone -Umeclidin-Vilant (TRELEGY ELLIPTA ) 200-62.5-25 MCG/ACT AEPB Inhale 1 puff into the lungs daily. (Patient not taking: Reported on 04/18/2024) 60 each 0   Furosemide  (FUROSCIX ) 80 MG/10ML CTKT Inject 80 mg into the skin as directed.     hydrOXYzine  (VISTARIL ) 25 MG capsule TAKE 1 CAPSULE (25 MG TOTAL) BY MOUTH DAILY AS NEEDED. (Patient taking differently: Take 25 mg by mouth daily as needed for  anxiety.) 90 capsule 1   ipratropium-albuterol  (DUONEB) 0.5-2.5 (3) MG/3ML SOLN TAKE 3 MLS BY NEBULIZATION EVERY 4 (FOUR) HOURS AS NEEDED (FOR SHORTNESS OF BREATH). (Patient not taking: Reported on 04/18/2024) 90 mL 1   ivabradine  (CORLANOR ) 7.5 MG TABS tablet Take 1 tablet (7.5 mg total) by mouth 2 (two) times daily with a meal. 60 tablet 6   megestrol  (MEGACE ) 20 MG tablet Take 1 tablet (20 mg total) by mouth 2 (two) times daily. 60 tablet 1   metolazone  (ZAROXOLYN ) 5 MG tablet Take 1 tablet (5 mg total) by mouth as needed (as instructed by Advanced HF clinic). 10 tablet 3   Multiple Vitamin (MULTIVITAMIN WITH MINERALS) TABS tablet Take 1 tablet by mouth daily.     nitroGLYCERIN  (NITROSTAT ) 0.4 MG SL tablet Place 1 tablet (0.4 mg total) under the tongue every 5 (five) minutes as needed for chest pain. 90 tablet 0   Nutritional Supplements (ENSURE ORIGINAL) LIQD Take 1 Bottle by mouth 2 (two) times daily after a meal. Drink 1 bottle 2 times daily.     potassium chloride  (KLOR-CON ) 20 MEQ packet Take 40 mEq by mouth daily. Take an extra 60 meq (3 packets) when you take Metolazone  30 packet 11   spironolactone  (ALDACTONE ) 50 MG tablet Take 1 tablet (50 mg total) by mouth daily. 90 tablet 3   torsemide  (DEMADEX ) 100 MG tablet Take 1 tablet (100 mg total) by mouth 2 (two) times daily. 60 tablet 3   No current facility-administered medications for this visit.    Allergies:   Lactose intolerance (gi) and Codeine   Social History:  The patient  reports that she quit smoking about 10 years ago. Her smoking use included cigarettes. She started smoking about 30 years ago. She has a 20 pack-year smoking history. She has never used smokeless tobacco. She reports current alcohol use of about 5.0 standard drinks of alcohol per week. She reports current drug use. Drug: Marijuana.   Family History:  The patient's family history includes Cancer in her maternal grandmother; Diabetes in her paternal grandmother;  Heart attack in her mother; Heart disease in her mother; Hypertension in her maternal grandmother and mother.   ROS:  Please see the history of present illness.   All other systems are personally reviewed and negative.    Wt Readings from Last 3 Encounters:  04/18/24 45.1 kg (99 lb 6.4 oz)  04/15/24 44 kg (97 lb)  04/07/24 41.8 kg (92 lb 2.4 oz)   There were no vitals taken for this visit.  Physical Exam General:  *** appearing.  No respiratory difficulty HEENT: normal Neck: supple. JVD *** cm.  Cor: PMI nondisplaced. Regular rate & rhythm. No rubs, gallops or murmurs. Lungs: clear Abdomen: soft, nontender, nondistended. Good bowel sounds. Extremities: no cyanosis, clubbing, rash, edema  Neuro: alert & oriented x 3. Moves all 4 extremities w/o difficulty. Affect pleasant.   Device interrogation (personally reviewed): No VT/AF Optivol way up   Assessment & Plan: 1. End-stage acute on chronic systolic Heart Failure - EF has been 20-25% for the last 5 years.  - S/p Medtronic ICD and Barostim - Echo 1/25: EF < 20%, mod to severely reduced RV (in setting of RSV) - Numerous hospitalizations over past 18 months  - Worse today NYHA IV. Volume overloaded *** - GDMT limited by need for BP support with midodrine  - Will give furoscix  with 2.5 mg metolazone  and kcl 60 for 2 days in am with torsemide  100 in afternoon *** - Will see back Monday to reasess, Will likely increase metolazone  for 1x/week to 3x/wee (MWF) with torsemide  100bid *** - Continue midodrine  5 mg tid.  - Continue Farxiga  10 mg daily. - Continue digoxin  0.125 mg daily.  - Continue ivabradine  7.5 mg bid. - Continue spironolactone  50 mg daily.  - Not a candidate for advanced therapies due to severity of lung disease.  - Labs today  2. Acute on chronic Hypoxic Respiratory Failure/ AECOPD  - Hx frequent admissions for AE COPD + A/C HFrEF.  - No longer on oxygen. - Continue inhalers. - diurese as above   3. CKD Stage  IIIb  - Creatinine baseline ~ 1.4-1.7 - Continue SGLT2i - Labs today   4. PAF  - In sinus today - Continue Eliquis  2.5 mg bid. Reduced dose (weight/creatinine).   5. CAD - History of CABG x 2 2019 - s/p DES ostial ramus extending to left main (2020) - No s/s angina - Continue atorvastatin  80 mg daily - No ASA with need for AC   6. H/o SCLC - Completed treatment 2015.  - CT chest (5/24) soft tissue thickening along mediastinum but no discrete mass    7. H/o Renal Infarct 2022 - On Eliquis . No bleeding issues.  8. GOC/FTT - long talk about role of Hospice Care for support to prevent suffering.if she continues to deteriorate -> she is not interested in this - She is DNR/DNI  Follow up ***  Elmarie Hacking, FNP 05/12/24

## 2024-05-12 NOTE — Telephone Encounter (Signed)
 Called to confirm/remind patient of their appointment at the [facility] Failure Clinic on 6/325.   Appointment:   [] Confirmed  [] Left mess   [x] No answer/No voice mail  [] VM Full/unable to leave message  [] Phone not in service

## 2024-05-13 ENCOUNTER — Encounter (HOSPITAL_COMMUNITY)

## 2024-05-19 ENCOUNTER — Encounter: Admitting: Adult Health

## 2024-05-20 NOTE — Progress Notes (Signed)
 Remote ICD transmission.

## 2024-05-20 NOTE — Addendum Note (Signed)
 Addended by: Edra Govern D on: 05/20/2024 03:08 PM   Modules accepted: Orders

## 2024-05-23 ENCOUNTER — Encounter: Payer: Self-pay | Admitting: Internal Medicine

## 2024-05-26 ENCOUNTER — Ambulatory Visit: Attending: Cardiology

## 2024-05-26 DIAGNOSIS — I5022 Chronic systolic (congestive) heart failure: Secondary | ICD-10-CM

## 2024-05-26 DIAGNOSIS — Z95810 Presence of automatic (implantable) cardiac defibrillator: Secondary | ICD-10-CM

## 2024-05-27 NOTE — Progress Notes (Signed)
 EPIC Encounter for ICM Monitoring  Patient Name: Brittany [name]  is a 63 y.o. female Date: 05/27/2024 Primary Care Physican: Abraham Abo, MD Primary Cardiologist: Bensimhon Electrophysiologist: Marven Slimmer 10/23/2023 Weight: 93-94 lbs 11/13/2023 Weight: 94 lbs  01/03/2024 Weight: 93-94 lbs  01/22/2024 Weight: 91 lbs (lowest weight 84 lbs) 04/18/2024 Office Weight:  99 lbs 05/27/2024 Weight: 87 lbs   Spoke with patient and heart failure questions reviewed.  Transmission results reviewed.  Pt asymptomatic for fluid accumulation.  She is doing well since she started Furoscix  and last dosage was around 6/1.  Optivol Thoracic impedance suggesting possible fluid accumulation starting 5/31 and returned to normal 6/11.    Prescribed: Torsemide  20 mg take 5 tablets (100 mg total) by mouth two times a day.  Potassium 20 mEq packet take 40 mEq total by mouth daily.  Take extra 60 mEq (3 packets) when you take Metolazone .   Spironolactone  50 mg take 1 tablet (50 mg total) by mouth daily Furosemide  (Furoscix ) 80 mg/10 ml as needed with 4 packets with Potassium.  She takes Furoscix  when she takes the Metolazone .   Metolazone  5 mg take 1 tablet by mouth as instructed by HF clinic   Labs: 04/18/2024 Creatinine 1.20, BUN 16, Potassium 3.2, Sodium 139, GFR 51  04/07/2024 Creatinine 1.70, BUN 60, Potassium 4.3, Sodium 135, GFR 33  04/06/2024 Creatinine 1.44, BUN 60, Potassium 2.9, Sodium 133, GFR 41  04/05/2024 Creatinine 1.58, BUN 65, Potassium 3.5, Sodium 134, GFR 37 04/04/2024 Creatinine 1.69, BUN 53, Potassium 3.5, Sodium 133, GFR 34  04/03/2024 Creatinine 1.49, BUN 43, Potassium 4.5, Sodium 139, GFR 39  A complete set of results can be found in Results Review.   Recommendations:   No changes and encouraged to call if experiencing any fluid symptoms.   Follow-up plan: ICM clinic phone appointment on 06/23/2024.   91 day device clinic remote transmission 06/30/2024.     EP/Cardiology Office  Visits:  06/09/2024 with HF Clinic.  Recall 07/26/2024 with Michaelle Adolphus, PA   Copy of ICM check sent to Dr. Marven Slimmer.  3 month ICM trend: 05/26/2024.    12-14 Month ICM trend:     Almyra Jain, RN 05/27/2024 3:32 PM

## 2024-05-28 ENCOUNTER — Ambulatory Visit: Admitting: Family Medicine

## 2024-06-09 ENCOUNTER — Ambulatory Visit (HOSPITAL_COMMUNITY): Payer: Self-pay | Admitting: Family Medicine

## 2024-06-09 ENCOUNTER — Ambulatory Visit (HOSPITAL_COMMUNITY)
Admission: RE | Admit: 2024-06-09 | Discharge: 2024-06-09 | Disposition: A | Discharge status: Home / Self Care | Source: Ambulatory Visit | Attending: Family Medicine | Admitting: Family Medicine

## 2024-06-09 ENCOUNTER — Encounter (HOSPITAL_COMMUNITY): Payer: Self-pay

## 2024-06-09 VITALS — BP 110/64 | HR 102 | Ht 59.0 in | Wt 89.6 lb

## 2024-06-09 DIAGNOSIS — I130 Hypertensive heart and chronic kidney disease with heart failure and stage 1 through stage 4 chronic kidney disease, or unspecified chronic kidney disease: Secondary | ICD-10-CM | POA: Insufficient documentation

## 2024-06-09 DIAGNOSIS — I5022 Chronic systolic (congestive) heart failure: Secondary | ICD-10-CM | POA: Insufficient documentation

## 2024-06-09 DIAGNOSIS — Z951 Presence of aortocoronary bypass graft: Secondary | ICD-10-CM | POA: Insufficient documentation

## 2024-06-09 DIAGNOSIS — N1832 Chronic kidney disease, stage 3b: Secondary | ICD-10-CM | POA: Diagnosis not present

## 2024-06-09 DIAGNOSIS — I2510 Atherosclerotic heart disease of native coronary artery without angina pectoris: Secondary | ICD-10-CM | POA: Insufficient documentation

## 2024-06-09 DIAGNOSIS — Z85118 Personal history of other malignant neoplasm of bronchus and lung: Secondary | ICD-10-CM | POA: Diagnosis not present

## 2024-06-09 DIAGNOSIS — Z95810 Presence of automatic (implantable) cardiac defibrillator: Secondary | ICD-10-CM | POA: Diagnosis not present

## 2024-06-09 DIAGNOSIS — J449 Chronic obstructive pulmonary disease, unspecified: Secondary | ICD-10-CM | POA: Diagnosis not present

## 2024-06-09 DIAGNOSIS — I252 Old myocardial infarction: Secondary | ICD-10-CM | POA: Diagnosis not present

## 2024-06-09 DIAGNOSIS — J9611 Chronic respiratory failure with hypoxia: Secondary | ICD-10-CM | POA: Insufficient documentation

## 2024-06-09 DIAGNOSIS — C3490 Malignant neoplasm of unspecified part of unspecified bronchus or lung: Secondary | ICD-10-CM

## 2024-06-09 DIAGNOSIS — Z87448 Personal history of other diseases of urinary system: Secondary | ICD-10-CM | POA: Insufficient documentation

## 2024-06-09 DIAGNOSIS — Z9221 Personal history of antineoplastic chemotherapy: Secondary | ICD-10-CM | POA: Insufficient documentation

## 2024-06-09 DIAGNOSIS — I5084 End stage heart failure: Secondary | ICD-10-CM | POA: Diagnosis not present

## 2024-06-09 DIAGNOSIS — I480 Paroxysmal atrial fibrillation: Secondary | ICD-10-CM | POA: Insufficient documentation

## 2024-06-09 DIAGNOSIS — Z87891 Personal history of nicotine dependence: Secondary | ICD-10-CM | POA: Diagnosis not present

## 2024-06-09 DIAGNOSIS — Z823 Family history of stroke: Secondary | ICD-10-CM | POA: Diagnosis not present

## 2024-06-09 DIAGNOSIS — I110 Hypertensive heart disease with heart failure: Secondary | ICD-10-CM | POA: Diagnosis present

## 2024-06-09 DIAGNOSIS — Z7901 Long term (current) use of anticoagulants: Secondary | ICD-10-CM | POA: Insufficient documentation

## 2024-06-09 DIAGNOSIS — Z955 Presence of coronary angioplasty implant and graft: Secondary | ICD-10-CM | POA: Diagnosis not present

## 2024-06-09 DIAGNOSIS — N280 Ischemia and infarction of kidney: Secondary | ICD-10-CM

## 2024-06-09 DIAGNOSIS — Z7189 Other specified counseling: Secondary | ICD-10-CM

## 2024-06-09 DIAGNOSIS — R627 Adult failure to thrive: Secondary | ICD-10-CM

## 2024-06-09 LAB — BASIC METABOLIC PANEL WITH GFR
Anion gap: 15 (ref 5–15)
BUN: 28 mg/dL — ABNORMAL HIGH (ref 8–23)
CO2: 29 mmol/L (ref 22–32)
Calcium: 10.1 mg/dL (ref 8.9–10.3)
Chloride: 92 mmol/L — ABNORMAL LOW (ref 98–111)
Creatinine, Ser: 1.31 mg/dL — ABNORMAL HIGH (ref 0.44–1.00)
GFR, Estimated: 46 mL/min — ABNORMAL LOW (ref >60)
Glucose, Bld: 128 mg/dL — ABNORMAL HIGH (ref 70–99)
Potassium: 3 mmol/L — ABNORMAL LOW (ref 3.5–5.1)
Sodium: 136 mmol/L (ref 135–145)

## 2024-06-09 LAB — CBC
HCT: 50.1 % — ABNORMAL HIGH (ref 36.0–46.0)
Hemoglobin: 15.9 g/dL — ABNORMAL HIGH (ref 12.0–15.0)
MCH: 28.1 pg (ref 26.0–34.0)
MCHC: 31.7 g/dL (ref 30.0–36.0)
MCV: 88.7 fL (ref 80.0–100.0)
Platelets: 241 10*3/uL (ref 150–400)
RBC: 5.65 MIL/uL — ABNORMAL HIGH (ref 3.87–5.11)
RDW: 17.3 % — ABNORMAL HIGH (ref 11.5–15.5)
WBC: 7.8 10*3/uL (ref 4.0–10.5)
nRBC: 0 % (ref 0.0–0.2)

## 2024-06-09 LAB — FERRITIN: Ferritin: 80 ng/mL (ref 11–307)

## 2024-06-09 LAB — BRAIN NATRIURETIC PEPTIDE: B Natriuretic Peptide: 1214.2 pg/mL — ABNORMAL HIGH (ref 0.0–100.0)

## 2024-06-09 LAB — IRON AND TIBC
Iron: 101 ug/dL (ref 28–170)
Saturation Ratios: 25 % (ref 10.4–31.8)
TIBC: 413 ug/dL (ref 250–450)
UIBC: 312 ug/dL

## 2024-06-09 MED ORDER — POTASSIUM CHLORIDE 20 MEQ PO PACK: 40.0000 meq | PACK | Freq: Every day | ORAL | 11 refills | Status: DC

## 2024-06-09 NOTE — Patient Instructions (Signed)
 Medication Changes:  USE 1 FURCOSCIX EVERY 2 WEEKS WITH AN EXTRA OF POTASSIUM   Lab Work:  Labs done today, your results will be available in MyChart, we will contact you for abnormal readings.  Follow-Up in: 3 MONTHS AS SCHEDULED WITH APP   At the [facility] Clinic, you and your health needs are our priority. We have a designated team specialized in the treatment of Heart Failure. This Care Team includes your primary Heart Failure Specialized Cardiologist (physician), Advanced Practice Providers (APPs- Physician Assistants and Nurse Practitioners), and Pharmacist who all work together to provide you with the care you need, when you need it.   You may see any of the following providers on your designated Care Team at your next follow up:  Dr. Toribio Fuel Dr. Ezra Shuck Dr. Ria Commander Dr. Odis Brownie Greig Mosses, NP Caffie Shed, [place] [facility] [place], [place] Beckey Coe, NP [place] Lee, NP Tinnie Redman, PharmD   Please be sure to bring in all your medications bottles to every appointment.   Need to Contact Us :  If you have any questions or concerns before your next appointment please send us  a message through [place] or call our office at [phone].    TO LEAVE A MESSAGE FOR THE NURSE SELECT OPTION 2, PLEASE LEAVE A MESSAGE INCLUDING: YOUR NAME DATE OF BIRTH CALL BACK NUMBER REASON FOR CALL**this is important as we prioritize the call backs  YOU WILL RECEIVE A CALL BACK THE SAME DAY AS LONG AS YOU CALL BEFORE 4:00 PM

## 2024-06-09 NOTE — Progress Notes (Signed)
 Advanced Heart Failure Clinic Note  PCP:  Tanda Bleacher, MD  Oncology: Dr Gatha  HF Cardiologist: Dr Cherrie  HPI: Brittany [name]  is a 63 y.o. 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 Underwent emergent cath 02/03/18 showed LAD 100% stenosed, LCx 95% stenosed. Taken for emergent CABG 02/03/18. Required impella post op. Hospital course complicated by cardiogenic shock, HCAP, A fib, respiratory failure, and swallowing issues. She was discharged to SNF. Discharge weight 103 pounds.    In 2019 had multiple hospitalizations for HF and pleural effusion. Underwent pleurodesis at [facility].   Admitted 3/20 with NSTEMI and HF. Received DES to ostial ramus into distal left main based on cath below and underwent diuresis. Meds adjusted as tolerated. Echo with EF 25-30%.    Admitted twice in 2023 with low output requiring milrinone . Barostim placed.   Multiple CHF and AECOPD admission in May, August, October of 2024 and Janurary 2025. Echo 1/25 EF  < 20%, G2DD, RV moderately to severely reduced. She remained DNR.  Admitted 3/25 for CHF and AECOPD. Readmitted 4/25 for CHF/COPD. Was hypotensive. Moved to ICU. Required DBA and IV diuresis. Refused [facility] services.   Post hospital follow up 5/25, volume overloaded and given Furoscix  + metolazone .   Today she returns for HF follow up. Overall feeling great! Responded well to Furoscix . Breathing much better, has used 2 doses of Furoscix  since last visit. Denies palpitations, abnormal bleeding, CP, dizziness, edema, or PND/Orthopnea. Appetite poor. Weight at home 87 pounds. Taking all medications, unable to get megace  from pharmacy. Not requiring  metolazone .   Past Medical History:  Diagnosis Date   Acute respiratory failure (HCC) 05/05/2018   Acute systolic congestive heart failure (HCC) 02/03/2018   AICD (automatic cardioverter/defibrillator) present    Allergy    Anxiety    Asthma    DM2 (diabetes mellitus, type 2) (HCC) 10/20/2020   Hypertension    PAF (paroxysmal atrial fibrillation) (HCC)    Presence of permanent cardiac pacemaker    Prophylactic measure 08/03/14-08/19/14   Prophyl. 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   LIMA to LAD, SVG to D1, SVG to OM1, EVH via right thigh with implantation of Impella LD LVAD via direct aortic approach   Small cell lung cancer (HCC) 03/16/2014   Past Surgical History:  Procedure Laterality Date   BRONCHIAL BRUSHINGS  10/25/2020   Procedure: BRONCHIAL BRUSHINGS;  Surgeon: Shelah Lamar RAMAN, MD;  Location: [facility] ENDOSCOPY;  Service: Pulmonary;;   BRONCHIAL NEEDLE ASPIRATION BIOPSY  10/25/2020   Procedure: BRONCHIAL NEEDLE ASPIRATION BIOPSIES;  Surgeon: Shelah Lamar RAMAN, MD;  Location: [facility] ENDOSCOPY;  Service: Pulmonary;;   CARDIAC DEFIBRILLATOR PLACEMENT  08/15/2018   MDT Visia AF MRI VR ICD implanted by Dr Zulma for primary prevention of sudden   CESAREAN SECTION     CORONARY ARTERY BYPASS GRAFT N/A 02/03/2018   Procedure: CORONARY ARTERY BYPASS GRAFTING (CABG);  Surgeon: Dusty Sudie DEL, MD;  Location: [facility] OR;  Service: Open Heart Surgery;  Laterality: N/A;  Time 3 using left internal mammary artery and endoscopically harvested right saphenous vein   CORONARY BALLOON ANGIOPLASTY N/A 02/03/2018   Procedure: CORONARY BALLOON ANGIOPLASTY;  Surgeon:  Swaziland, Peter M, MD;  Location: [facility] INVASIVE CV LAB;  Service: Cardiovascular;  Laterality: N/A;   CORONARY STENT INTERVENTION N/A 02/12/2019   Procedure: CORONARY STENT INTERVENTION;  Surgeon: Darron Deatrice LABOR, MD;  Location: [facility] INVASIVE CV LAB;  Service: Cardiovascular;  Laterality: N/A;   CORONARY/GRAFT ACUTE MI  REVASCULARIZATION N/A 02/03/2018   Procedure: Coronary/Graft Acute MI Revascularization;  Surgeon: Swaziland, Peter M, MD;  Location: [facility] INVASIVE CV LAB;  Service: Cardiovascular;  Laterality: N/A;   ENDOBRONCHIAL ULTRASOUND N/A 10/25/2020   Procedure: ENDOBRONCHIAL ULTRASOUND;  Surgeon: Shelah Lamar RAMAN, MD;  Location: [facility] ENDOSCOPY;  Service: Pulmonary;  Laterality: N/A;   FLEXIBLE BRONCHOSCOPY  10/25/2020   Procedure: FLEXIBLE BRONCHOSCOPY;  Surgeon: Shelah Lamar RAMAN, MD;  Location: [facility] ENDOSCOPY;  Service: Pulmonary;;   IABP INSERTION N/A 02/03/2018   Procedure: IABP Insertion;  Surgeon: Swaziland, Peter M, MD;  Location: [facility] INVASIVE CV LAB;  Service: Cardiovascular;  Laterality: N/A;   INTRAOPERATIVE TRANSESOPHAGEAL ECHOCARDIOGRAM N/A 02/03/2018   Procedure: INTRAOPERATIVE TRANSESOPHAGEAL ECHOCARDIOGRAM;  Surgeon: Dusty Sudie DEL, MD;  Location: [facility] OR;  Service: Open Heart Surgery;  Laterality: N/A;   LEFT HEART CATH AND CORONARY ANGIOGRAPHY N/A 02/03/2018   Procedure: LEFT HEART CATH AND CORONARY ANGIOGRAPHY;  Surgeon: Swaziland, Peter M, MD;  Location: [facility] INVASIVE CV LAB;  Service: Cardiovascular;  Laterality: N/A;   LEFT HEART CATH AND CORS/GRAFTS ANGIOGRAPHY N/A 02/12/2019   Procedure: LEFT HEART CATH AND CORS/GRAFTS ANGIOGRAPHY;  Surgeon: Darron Deatrice LABOR, MD;  Location: [facility] INVASIVE CV LAB;  Service: Cardiovascular;  Laterality: N/A;   MEDIASTINOSCOPY N/A 03/11/2014   Procedure: MEDIASTINOSCOPY;  Surgeon: Elspeth JAYSON Millers, MD;  Location: [facility] OR;  Service: Thoracic;  Laterality: N/A;   PLACEMENT OF IMPELLA LEFT VENTRICULAR ASSIST DEVICE  02/03/2018   Procedure: PLACEMENT OF IMPELLA LEFT VENTRICULAR ASSIST DEVICE LD;  Surgeon: Dusty Sudie DEL, MD;  Location: [facility] OR;  Service: Open Heart Surgery;;   REMOVAL OF IMPELLA LEFT VENTRICULAR ASSIST DEVICE N/A 02/08/2018   Procedure: REMOVAL OF IMPELLA LEFT VENTRICULAR ASSIST DEVICE;  Surgeon: Dusty Sudie DEL, MD;  Location: [facility] OR;  Service: Open Heart Surgery;   Laterality: N/A;   RIGHT HEART CATH N/A 02/03/2018   Procedure: RIGHT HEART CATH;  Surgeon: Swaziland, Peter M, MD;  Location: [facility] INVASIVE CV LAB;  Service: Cardiovascular;  Laterality: N/A;   RIGHT HEART CATH N/A 05/09/2018   Procedure: RIGHT HEART CATH;  Surgeon: Cherrie Toribio SAUNDERS, MD;  Location: [facility] INVASIVE CV LAB;  Service: Cardiovascular;  Laterality: N/A;   TEE WITHOUT CARDIOVERSION N/A 02/08/2018   Procedure: TRANSESOPHAGEAL ECHOCARDIOGRAM (TEE);  Surgeon: Dusty Sudie DEL, MD;  Location: [facility] OR;  Service: Open Heart Surgery;  Laterality: N/A;   TUBAL LIGATION     VIDEO BRONCHOSCOPY WITH ENDOBRONCHIAL ULTRASOUND N/A 03/11/2014   Procedure: VIDEO BRONCHOSCOPY WITH ENDOBRONCHIAL ULTRASOUND;  Surgeon: Elspeth JAYSON Millers, MD;  Location: [facility] OR;  Service: Thoracic;  Laterality: N/A;   Current Outpatient Medications  Medication Sig Dispense Refill   albuterol  (VENTOLIN  HFA) 108 (90 Base) MCG/ACT inhaler Inhale 2 puffs into the lungs every 6 (six) hours as needed for wheezing or shortness of breath. 8.5 g 1   cyclobenzaprine  (FLEXERIL ) 10 MG tablet Take 1 tablet (10 mg total) by mouth 3 (three) times daily as needed for muscle spasms. 60 tablet 3   dapagliflozin  propanediol (FARXIGA ) 10 MG TABS tablet Take 1 tablet (10 mg total) by mouth daily. 90 tablet 3   digoxin  (LANOXIN ) 0.125 MG tablet Take 1 tablet (125  mcg total) by mouth at bedtime. 90 tablet 3   ELIQUIS  2.5 MG TABS tablet TAKE 1 TABLET BY MOUTH TWICE A DAY 180 tablet 1   Fluticasone -Umeclidin-Vilant (TRELEGY ELLIPTA ) 200-62.5-25 MCG/ACT AEPB Inhale 1 puff into the lungs daily. 60 each 0   Furosemide  (FUROSCIX ) 80 MG/10ML CTKT Inject 80 mg into the skin as directed.     hydrOXYzine  (VISTARIL ) 25 MG capsule TAKE 1 CAPSULE (25 MG TOTAL) BY MOUTH DAILY AS NEEDED. 90 capsule 1   ipratropium-albuterol  (DUONEB) 0.5-2.5 (3) MG/3ML SOLN TAKE 3 MLS BY NEBULIZATION EVERY 4 (FOUR) HOURS AS NEEDED (FOR SHORTNESS OF BREATH). 90 mL 1   ivabradine   (CORLANOR ) 7.5 MG TABS tablet Take 1 tablet (7.5 mg total) by mouth 2 (two) times daily with a meal. 60 tablet 6   metolazone  (ZAROXOLYN ) 5 MG tablet Take 1 tablet (5 mg total) by mouth as needed (as instructed by Advanced HF clinic). 10 tablet 3   nitroGLYCERIN  (NITROSTAT ) 0.4 MG SL tablet Place 1 tablet (0.4 mg total) under the tongue every 5 (five) minutes as needed for chest pain. 90 tablet 0   potassium chloride  (KLOR-CON ) 20 MEQ packet Take 40 mEq by mouth daily. Take an extra 60 meq (3 packets) when you take Metolazone  30 packet 11   spironolactone  (ALDACTONE ) 50 MG tablet Take 1 tablet (50 mg total) by mouth daily. 90 tablet 3   torsemide  (DEMADEX ) 100 MG tablet Take 1 tablet (100 mg total) by mouth 2 (two) times daily. 60 tablet 3   atorvastatin  (LIPITOR ) 80 MG tablet Take 1 tablet (80 mg total) by mouth daily. 90 tablet 0   megestrol  (MEGACE ) 20 MG tablet Take 1 tablet (20 mg total) by mouth 2 (two) times daily. 60 tablet 1   Multiple Vitamin (MULTIVITAMIN WITH MINERALS) TABS tablet Take 1 tablet by mouth daily.     Nutritional Supplements (ENSURE ORIGINAL) LIQD Take 1 Bottle by mouth 2 (two) times daily after a meal. Drink 1 bottle 2 times daily.     No current facility-administered medications for this encounter.    Allergies:   Lactose intolerance (gi) and Codeine   Social History:  The patient  reports that she quit smoking about 10 years ago. Her smoking use included cigarettes. She started smoking about 30 years ago. She has a 20 pack-year smoking history. She has never used smokeless tobacco. She reports current alcohol use of about 5.0 standard drinks of alcohol per week. She reports current drug use. Drug: Marijuana.   Family History:  The patient's family history includes Cancer in her maternal grandmother; Diabetes in her paternal grandmother; Heart attack in her mother; Heart disease in her mother; Hypertension in her maternal grandmother and mother.   ROS:  Please see the  history of present illness.   All other systems are personally reviewed and negative.    Wt Readings from Last 3 Encounters:  06/09/24 40.6 kg (89 lb 9.6 oz)  04/18/24 45.1 kg (99 lb 6.4 oz)  04/15/24 44 kg (97 lb)   BP 110/64   Pulse (!) 102   Ht 4' 11 (1.499 m)   Wt 40.6 kg (89 lb 9.6 oz)   SpO2 96%   BMI 18.10 kg/m   Physical Exam General:  NAD. No resp difficulty, walked into clinic, thin, frail HEENT: Normal Neck: Supple. No JVD. Cor: Regular rate & rhythm. No rubs, gallops or murmurs. Lungs: Clear, diminished in bases Abdomen: Soft, nontender, nondistended.  Extremities: No cyanosis, clubbing, rash, edema Neuro:  Alert & oriented x 3, moves all 4 extremities w/o difficulty. Affect pleasant.  Device interrogation (personally reviewed): OptiVol ok, 0.9 hr/day activity, no AF, < 0.1% VP, 1 episode of NSVT  Assessment & Plan: 1. End-stage Chronic Systolic Heart Failure - EF has been 20-25% for the last 5 years.  - S/p Medtronic ICD and Barostim - Echo 1/25: EF < 20%, mod to severely reduced RV (in setting of RSV) - Numerous hospitalizations over past 18 months  - Improved NYHA III, functional class confounded by lung disease. Volume OK today on exam and OptiVol - Can use Furoscix  + extra 60 KCL every 10-14 days (does not diurese well with metolazone ) - Continue torsemide  100 mg bid + 40 KCL - Continue Farxiga  10 mg daily. - Continue digoxin  0.125 mg daily.  - Continue ivabradine  7.5 mg bid. - Continue spironolactone  50 mg daily.  - Not a candidate for advanced therapies due to severity of lung disease.  - Labs today  2. Chronic Hypoxic Respiratory Failure/COPD  - Hx frequent admissions for AE COPD + A/C HFrEF.  - No longer on oxygen. - Continue inhalers. - Diureses as above   3. CKD Stage IIIb  - Creatinine baseline ~ 1.4-1.7 - Continue SGLT2i - Labs today   4. PAF  - Regular on exam today - Continue Eliquis  2.5 mg bid. Reduced dose (weight/creatinine).    5. CAD - History of CABG x 2 2019 - s/p DES ostial ramus extending to left main (2020) - No s/s angina - Continue atorvastatin  80 mg daily - No ASA with need for AC   6. H/o SCLC - Completed treatment 2015.  - CT chest (5/24) soft tissue thickening along mediastinum but no discrete mass    7. H/o Renal Infarct 2022 - On Eliquis . No bleeding issues.  8. GOC/FTT - Dr. Bensimhon previously had a long discussion about role of Hospice Care for support to prevent suffering if she continues to deteriorate -> she is not interested in this - She is DNR/DNI  Follow up in 3 months with APP  Harlene CHRISTELLA Gainer, FNP 06/09/24

## 2024-06-11 MED ORDER — POTASSIUM CHLORIDE 20 MEQ PO PACK: 40.0000 meq | PACK | Freq: Two times a day (BID) | ORAL | 11 refills | Status: DC

## 2024-06-11 NOTE — Telephone Encounter (Signed)
Pt aware.

## 2024-06-19 ENCOUNTER — Other Ambulatory Visit (HOSPITAL_COMMUNITY)

## 2024-06-20 ENCOUNTER — Encounter: Payer: Self-pay | Admitting: *Deleted

## 2024-06-23 ENCOUNTER — Other Ambulatory Visit (HOSPITAL_COMMUNITY)

## 2024-06-23 ENCOUNTER — Ambulatory Visit: Attending: Cardiology

## 2024-06-23 DIAGNOSIS — Z95810 Presence of automatic (implantable) cardiac defibrillator: Secondary | ICD-10-CM | POA: Diagnosis not present

## 2024-06-23 DIAGNOSIS — I5022 Chronic systolic (congestive) heart failure: Secondary | ICD-10-CM | POA: Diagnosis not present

## 2024-06-23 NOTE — Progress Notes (Unsigned)
 EPIC Encounter for ICM Monitoring  Patient Name: Brittany [name]  is a 63 y.o. female Date: 06/23/2024 Primary Care Physican: Tanda Bleacher, MD Primary Cardiologist: Bensimhon Electrophysiologist: Cindie 10/23/2023 Weight: 93-94 lbs 11/13/2023 Weight: 94 lbs  01/03/2024 Weight: 93-94 lbs  01/22/2024 Weight: 91 lbs (lowest weight 84 lbs) 04/18/2024 Office Weight:  99 lbs 05/27/2024 Weight: 87 lbs   Spoke with patient and heart failure questions reviewed.  Transmission results reviewed.  Pt reports feeling SOB and needed to sit up at night the last couple nights.  Last Furoscix  dosage was > 8 days and will take a dosage tomorrow.   Optivol Thoracic impedance suggesting possible fluid accumulation starting 6/30.    Prescribed: Torsemide  20 mg take 5 tablets (100 mg total) by mouth two times a day.  Potassium 20 mEq packet take 40 mEq total by mouth twice day.  Take extra 60 mEq (3 packets) when you take Metolazone .   Spironolactone  50 mg take 1 tablet (50 mg total) by mouth daily Furosemide  (Furoscix ) 80 mg/10 ml as needed with 4 packets with Potassium.  She takes Furoscix  when she takes the Metolazone .     Labs: 06/24/2024 BMET scheduled at HF clinic 06/09/2024 Creatinine 1.31, BUN 28, Potassium 3.0, Sodium 136, GFR 46 04/18/2024 Creatinine 1.20, BUN 16, Potassium 3.2, Sodium 139, GFR 51  04/07/2024 Creatinine 1.70, BUN 60, Potassium 4.3, Sodium 135, GFR 33  04/06/2024 Creatinine 1.44, BUN 60, Potassium 2.9, Sodium 133, GFR 41  04/05/2024 Creatinine 1.58, BUN 65, Potassium 3.5, Sodium 134, GFR 37 04/04/2024 Creatinine 1.69, BUN 53, Potassium 3.5, Sodium 133, GFR 34  04/03/2024 Creatinine 1.49, BUN 43, Potassium 4.5, Sodium 139, GFR 39  A complete set of results can be found in Results Review.   Recommendations:   The plan is to take Furoscix  dosage tomorrow and extra 60 mEq of Potassium as prescribed.  Sent to [name], NP for review of plan.     Follow-up plan: ICM clinic  phone appointment on 06/27/2024 (manual) to recheck fluid levels.   91 day device clinic remote transmission 06/30/2024.     EP/Cardiology Office Visits:  09/09/2024 with HF Clinic.  Recall 07/26/2024 with [name], PA   Copy of ICM check sent to Dr. Cindie.  3 month ICM trend: 06/23/2024.    12-14 Month ICM trend:     [name], RN 06/23/2024 4:51 PM

## 2024-06-24 ENCOUNTER — Ambulatory Visit (HOSPITAL_COMMUNITY): Payer: Self-pay | Admitting: Family Medicine

## 2024-06-24 ENCOUNTER — Other Ambulatory Visit (HOSPITAL_COMMUNITY): Payer: Self-pay

## 2024-06-24 ENCOUNTER — Ambulatory Visit (HOSPITAL_COMMUNITY)
Admission: RE | Admit: 2024-06-24 | Discharge: 2024-06-24 | Disposition: A | Discharge status: Home / Self Care | Source: Ambulatory Visit | Attending: Cardiology | Admitting: Cardiology

## 2024-06-24 ENCOUNTER — Telehealth: Payer: Self-pay

## 2024-06-24 DIAGNOSIS — I5022 Chronic systolic (congestive) heart failure: Secondary | ICD-10-CM | POA: Insufficient documentation

## 2024-06-24 LAB — BASIC METABOLIC PANEL WITH GFR
Anion gap: 10 (ref 5–15)
BUN: 12 mg/dL (ref 8–23)
CO2: 27 mmol/L (ref 22–32)
Calcium: 10 mg/dL (ref 8.9–10.3)
Chloride: 103 mmol/L (ref 98–111)
Creatinine, Ser: 1.13 mg/dL — ABNORMAL HIGH (ref 0.44–1.00)
GFR, Estimated: 55 mL/min — ABNORMAL LOW (ref >60)
Glucose, Bld: 146 mg/dL — ABNORMAL HIGH (ref 70–99)
Potassium: 5.5 mmol/L — ABNORMAL HIGH (ref 3.5–5.1)
Sodium: 140 mmol/L (ref 135–145)

## 2024-06-24 MED ORDER — FUROSCIX 80 MG/10ML ~~LOC~~ CTKT
80.0000 mg | CARTRIDGE | SUBCUTANEOUS | 0 refills | Status: DC
  Filled 2024-06-24: qty 4, 28d supply, fill #0

## 2024-06-24 NOTE — Progress Notes (Signed)
  Received: Today [name], Harlene HERO, FNP  Hezakiah Champeau, Mitzie RAMAN, RN I see she has BMET today, OK to keep this appt and will give recs when I see her labs

## 2024-06-24 NOTE — Telephone Encounter (Signed)
 Refill sent to [facility] pharmacy  Message to HF Pharm team as FYI

## 2024-06-24 NOTE — Progress Notes (Signed)
 Clarified with Harlene if patient may proceed with the recommendation before labs drawn and received the message that she may go ahead and repeat today.  Received: Today [name], Harlene HERO, FNP  Elyse Prevo, Mitzie RAMAN, RN She can go ahead and repeat today

## 2024-06-24 NOTE — Progress Notes (Signed)
 [place], [place], FNP  Dabney Dever, Mitzie RAMAN, RN She can repeat Furoscix  dose today; do Furoscix  this AM with 40 KCL, then torsemide  100 mg with 40 KCL this evening.  She will need a BMET this Friday.

## 2024-06-24 NOTE — Progress Notes (Signed)
 Spoke with patient.  She completed labs about 45 minutes ago and on her way home.  Advised of Harlene [name]'s recommendation to proceed with repeat Furoscix  dosage today with 40 KCL and 100 mg Torsemide  tonight with 40 KCL.  She verbalized understanding.  She also stated she needs Furoscix  refill and phone note sent to [facility] refill department for follow up.   She will call back for any questions or concerns and send manual remote transmission on 7/18.

## 2024-06-24 NOTE — Progress Notes (Signed)
 Spoke with patient and advised to get labs as planned today and then will provide recommendations.  She is feeling better after taking her breathing treatment this morning.

## 2024-06-24 NOTE — Telephone Encounter (Signed)
 ICM call to patient.  During call she stated she needs a refill of Furoscix .  Advised will send request to HF clinic for refill.

## 2024-06-24 NOTE — Progress Notes (Signed)
 Received: Today [name], Harlene HERO, FNP  Gardner Servantes, Mitzie RAMAN, RN No change to plan, needs to use Furoscix  + 60 KCL this AM (hold AM dose of torsemide ), but would take evening dose of torsemide  100 mg with 40 KCL, then tomorrow back to torsemide  100 bid + 60 KCL  bid

## 2024-06-24 NOTE — Progress Notes (Signed)
 Spoke with patient this AM, 7/15 to provide Harlene [name]'s recommendations but unable to follow the recommendations due to patient took the Furoscix  after we spoke yesterday evening on 7/14.    Pt took Furoscix  with 60 of KCL yesterday, 7/14 with moderate urine ouput.  She is still feeling SOB this AM and is going to take breathing treatment now.  She thought she would have better UO last night after the Furoscix  but did think it improved her breathing.  She did not have to sleep sitting up last night but tossed and turned most of the night trying to get comfortable.     7/15 Optivol update after taking Furoscix  on 7/14   Sent to [facility] for review and request recommendations based on information received this morning, 7/15, from patient as well as updated Optivol.

## 2024-06-25 ENCOUNTER — Other Ambulatory Visit (HOSPITAL_COMMUNITY): Payer: Self-pay

## 2024-06-25 ENCOUNTER — Telehealth (HOSPITAL_COMMUNITY): Payer: Self-pay

## 2024-06-25 NOTE — Telephone Encounter (Signed)
 Advanced Heart Failure Patient Advocate Encounter  Prior authorization for Furoscix  has been approved by [facility] Medicare. Test billing returns a submit to primary payer rejection, despite calling insurance and being reassured by representative that this should not be an issues.  I was able to contact patient and confirm that there is other active insurance with this patient covered as the spouse. Confirmed card information with patient and spouse, have been unable to submit prior authorization to [employer] as of yet. Multiple calls to insurance and both patient and spouse have confirmed that pt and spouse do reside in different zip codes. Insurance insists that phone number and zip code on file do not match with what is on file for the patient. I was able to verify using member ID with ZIP code [id number], but have been unable to start a prior auth using this information. Attempted to submit prior auth by phone, line was disconnected or went silent multiple times after reaching a representative. Will continue to try to submit prior auth with [employer], as [facility] generally approves for single dosing only.

## 2024-06-26 ENCOUNTER — Other Ambulatory Visit (HOSPITAL_COMMUNITY): Payer: Self-pay

## 2024-06-26 NOTE — Telephone Encounter (Signed)
 Prior auth successfully completed for Furoscix  by phone for [employer].  Case ID [id number] Determination expected within 14 business days (07/16/2024)

## 2024-06-27 ENCOUNTER — Encounter

## 2024-06-30 ENCOUNTER — Other Ambulatory Visit (HOSPITAL_COMMUNITY): Payer: Self-pay

## 2024-06-30 ENCOUNTER — Ambulatory Visit (INDEPENDENT_AMBULATORY_CARE_PROVIDER_SITE_OTHER): Payer: BC Managed Care – PPO

## 2024-06-30 ENCOUNTER — Ambulatory Visit: Attending: Cardiology

## 2024-06-30 DIAGNOSIS — I5022 Chronic systolic (congestive) heart failure: Secondary | ICD-10-CM

## 2024-06-30 DIAGNOSIS — Z95810 Presence of automatic (implantable) cardiac defibrillator: Secondary | ICD-10-CM

## 2024-06-30 NOTE — Progress Notes (Unsigned)
 EPIC Encounter for ICM Monitoring  Patient Name: Brittany [name]  is a 63 y.o. female Date: 06/30/2024 Primary Care Physican: Tanda Bleacher, MD Primary Cardiologist: Bensimhon Electrophysiologist: Cindie 01/03/2024 Weight: 93-94 lbs  01/22/2024 Weight: 91 lbs (lowest weight 84 lbs) 04/18/2024 Office Weight:  99 lbs 05/27/2024 Weight: 87 lbs   Spoke with patient and heart failure questions reviewed.  Transmission results reviewed.  Pt reports feeling much better after taking Furoscix  last week.  7/15 Lab results showed elevated Potassium which was addressed by HF clinic.  Pt is supposed to be have repeat labs tomorrow, 7/22 but she has a Pulmonary office check tomorrow.  She pays for transportation and only able to pay for 1 office visit tomorrow so she will need to cancel lab appt tomorrow.  She is close to reaching her budget for the month and not sure if she can reschedule labs for next week due to transportation cost.     Optivol Thoracic impedance suggesting fluid levels returned to normal after taking 1 dose of Furoscix .    Prescribed: Torsemide  20 mg take 5 tablets (100 mg total) by mouth two times a day.  Potassium 20 mEq packet take 40 mEq total by mouth twice day.  Take extra 60 mEq (3 packets) when you take Metolazone  or Furoscix .   Spironolactone  50 mg take 1 tablet (50 mg total) by mouth daily Furosemide  (Furoscix ) 80 mg/10 ml as directed   Labs: 06/24/2024 Creatinine 1.13, BUN 12, Potassium 5.5, Sodium 140, GFR 55 06/09/2024 Creatinine 1.31, BUN 28, Potassium 3.0, Sodium 136, GFR 46 04/18/2024 Creatinine 1.20, BUN 16, Potassium 3.2, Sodium 139, GFR 51  04/07/2024 Creatinine 1.70, BUN 60, Potassium 4.3, Sodium 135, GFR 33  04/06/2024 Creatinine 1.44, BUN 60, Potassium 2.9, Sodium 133, GFR 41  04/05/2024 Creatinine 1.58, BUN 65, Potassium 3.5, Sodium 134, GFR 37 04/04/2024 Creatinine 1.69, BUN 53, Potassium 3.5, Sodium 133, GFR 34  04/03/2024 Creatinine 1.49, BUN 43,  Potassium 4.5, Sodium 139, GFR 39  A complete set of results can be found in Results Review.   Recommendations:  Message sent to HF clinic for lab options and will check if labs can be drawn at the pulmonary office.  Will follow up with patient tomorrow morning 7/22.   No changes and encouraged to call if experiencing any fluid symptoms.   Follow-up plan: ICM clinic phone appointment on 07/14/2024 to recheck fluid levels.   91 day device clinic remote transmission 09/29/2024.     EP/Cardiology Office Visits:  09/09/2024 with HF Clinic.  Recall 07/26/2024 with [name], PA   Copy of ICM check sent to Dr. Cindie.   3 month ICM trend: 06/30/2024.    12-14 Month ICM trend:     [name], RN 06/30/2024 4:42 PM

## 2024-06-30 NOTE — Telephone Encounter (Signed)
 Test billing still indicates prior auth has not been reviewed / approved at this time. Will continue to follow up.

## 2024-07-01 ENCOUNTER — Telehealth: Payer: Self-pay

## 2024-07-01 ENCOUNTER — Other Ambulatory Visit (HOSPITAL_COMMUNITY)

## 2024-07-01 ENCOUNTER — Encounter: Admitting: Adult Health

## 2024-07-01 NOTE — Telephone Encounter (Signed)
 Will await Dr. Nohemi response.

## 2024-07-01 NOTE — Telephone Encounter (Signed)
 Ok with me

## 2024-07-01 NOTE — Telephone Encounter (Signed)
 Copied from CRM [id number]. Topic: Appointments - Transfer of Care >> Jul 01, 2024 12:20 PM Celestine FALCON wrote: Pt is requesting to transfer FROM: Dr. Neda Pt is requesting to transfer TO: Dr. Annella Reason for requested transfer: Preferred to see a new provider for COPD. It is the responsibility of the team the patient would like to transfer to (Dr. Annella) to reach out to the patient if for any reason this transfer is not acceptable.    Spoke to patient and advised her of office protocol. She is aware that approval is needed from both providers to switch.   Dr. MALVA and Dr. Annella, please advise. Thanks

## 2024-07-01 NOTE — Progress Notes (Unsigned)
 Spoke with patient and advised [name], NP has confirmed she needs repeat labs to check potassium.   Pulmonary care office does not have technician in the office to draw labs at her 2:00 appointment.  Advised patient HF clinic staff is working to get transportation to the clinic for lab work and will be calling her.  She would like to have labs drawn today instead of tomorrow since she already has OV scheduled today. It is difficult for her to have 2 appointments in one day.   HF clinic was calling patient as I was speaking to her and advised to take the call to work out the lab appt and transportation.

## 2024-07-02 ENCOUNTER — Ambulatory Visit (HOSPITAL_COMMUNITY): Payer: Self-pay | Admitting: Family Medicine

## 2024-07-02 ENCOUNTER — Ambulatory Visit (HOSPITAL_COMMUNITY)
Admission: RE | Admit: 2024-07-02 | Discharge: 2024-07-02 | Disposition: A | Discharge status: Home / Self Care | Source: Ambulatory Visit | Attending: Cardiology | Admitting: Cardiology

## 2024-07-02 ENCOUNTER — Other Ambulatory Visit (HOSPITAL_COMMUNITY): Payer: Self-pay

## 2024-07-02 DIAGNOSIS — I5022 Chronic systolic (congestive) heart failure: Secondary | ICD-10-CM | POA: Diagnosis present

## 2024-07-02 LAB — BASIC METABOLIC PANEL WITH GFR
Anion gap: 18 — ABNORMAL HIGH (ref 5–15)
BUN: 34 mg/dL — ABNORMAL HIGH (ref 8–23)
CO2: 27 mmol/L (ref 22–32)
Calcium: 9.7 mg/dL (ref 8.9–10.3)
Chloride: 90 mmol/L — ABNORMAL LOW (ref 98–111)
Creatinine, Ser: 1.58 mg/dL — ABNORMAL HIGH (ref 0.44–1.00)
GFR, Estimated: 37 mL/min — ABNORMAL LOW (ref >60)
Glucose, Bld: 193 mg/dL — ABNORMAL HIGH (ref 70–99)
Potassium: 3.9 mmol/L (ref 3.5–5.1)
Sodium: 135 mmol/L (ref 135–145)

## 2024-07-02 LAB — CUP PACEART REMOTE DEVICE CHECK
Battery Remaining Longevity: 70 mo
Battery Voltage: 3 V
Brady Statistic RV Percent Paced: 0 %
Date Time Interrogation Session: 20250721002204
HighPow Impedance: 73 Ohm
Implantable Lead Connection Status: 753985
Implantable Lead Implant Date: 20190905
Implantable Lead Location: 753860
Implantable Pulse Generator Implant Date: 20190905
Lead Channel Impedance Value: 380 Ohm
Lead Channel Impedance Value: 494 Ohm
Lead Channel Pacing Threshold Amplitude: 0.75 V
Lead Channel Pacing Threshold Pulse Width: 0.4 ms
Lead Channel Sensing Intrinsic Amplitude: 26.875 mV
Lead Channel Sensing Intrinsic Amplitude: 26.875 mV
Lead Channel Setting Pacing Amplitude: 2 V
Lead Channel Setting Pacing Pulse Width: 0.4 ms
Lead Channel Setting Sensing Sensitivity: 0.3 mV
Zone Setting Status: 755011
Zone Setting Status: 755011

## 2024-07-02 NOTE — Progress Notes (Signed)
 Pt was notified by HF clinic that transportation would be covered and lab appt changed to 7/23.

## 2024-07-03 ENCOUNTER — Other Ambulatory Visit (HOSPITAL_COMMUNITY): Payer: Self-pay

## 2024-07-03 ENCOUNTER — Ambulatory Visit: Payer: Self-pay | Admitting: Cardiology

## 2024-07-03 NOTE — Telephone Encounter (Addendum)
 Prior authorization has been approved from 05/27/2024 until 08/02/2024. Test billing returns a copay of $60. [facility] is currently billing to [employer] and Medicare coverage for a total pt cost of $12.15.  Patient informed by phone

## 2024-07-08 ENCOUNTER — Other Ambulatory Visit (HOSPITAL_COMMUNITY): Payer: Self-pay

## 2024-07-08 ENCOUNTER — Other Ambulatory Visit (HOSPITAL_COMMUNITY): Payer: Self-pay | Admitting: Internal Medicine

## 2024-07-08 ENCOUNTER — Telehealth (HOSPITAL_COMMUNITY): Payer: Self-pay

## 2024-07-08 ENCOUNTER — Other Ambulatory Visit: Payer: Self-pay | Admitting: Family Medicine

## 2024-07-08 NOTE — Telephone Encounter (Signed)
 Advanced Heart Failure Patient Advocate Encounter  Spoke with patient and pharmacy to confirm availability and copay. Medication is being changed to deliver to patient.  Rachel DEL, CPhT Rx Patient Advocate Phone: [phone]

## 2024-07-09 ENCOUNTER — Other Ambulatory Visit (HOSPITAL_COMMUNITY): Payer: Self-pay

## 2024-07-14 ENCOUNTER — Ambulatory Visit: Attending: Cardiology

## 2024-07-14 ENCOUNTER — Telehealth: Payer: Self-pay

## 2024-07-14 DIAGNOSIS — I5022 Chronic systolic (congestive) heart failure: Secondary | ICD-10-CM

## 2024-07-14 DIAGNOSIS — Z95810 Presence of automatic (implantable) cardiac defibrillator: Secondary | ICD-10-CM

## 2024-07-14 NOTE — Progress Notes (Unsigned)
 Spoke with patient and advised [name], NP at HF recommended the following:  Use Furoscix  + 40 KCL daily x 3 days in a row. Hold torsemide  while using furoscix .  After 3 days, go back to torsemide  100 mg bid + 40 KCL bid.  BMET in a week. Scheduled 8/12 at HF clinic. She will need an APP appt in 1-2 weeks to re-evaluate please   Harlene will have more Furoscix  doses mailed overnight to patient since she only has 2 doses left.  [facility] clinical pool triage included on recommendations to help schedule APP appointment and send more Furoscix  dosages..  Pt verbalized understanding and repeated instructions back correctly.  She will use 911 if sx worsen or do not improve.

## 2024-07-14 NOTE — Telephone Encounter (Signed)
 Remote ICM transmission received.  Attempted call to patient regarding ICM remote transmission and no answer.
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
## 2024-07-14 NOTE — Progress Notes (Unsigned)
 Message Received: Today [name], Harlene HERO, FNP  Parminder Cupples, Mitzie RAMAN, RN; P [facility] Clinical Pool Oh that's awful, I'm so sorry that happened to her.  Use Furoscix  + 40 KCL daily x 3 days in a row. Hold torsemide  while using furoscix . After 3 days, go back to torsemide  100 mg bid + 40 KCL bid. She will need a BMET in a week. She will need an APP appt in 1-2 weeks to re-evaluate please

## 2024-07-15 ENCOUNTER — Other Ambulatory Visit (HOSPITAL_COMMUNITY): Payer: Self-pay

## 2024-07-15 ENCOUNTER — Inpatient Hospital Stay (HOSPITAL_COMMUNITY)
Admission: EM | Admit: 2024-07-15 | Discharge: 2024-07-20 | DRG: 291 | Disposition: A | Discharge status: Home / Self Care | Attending: Internal Medicine | Admitting: Internal Medicine

## 2024-07-15 ENCOUNTER — Emergency Department (HOSPITAL_COMMUNITY)

## 2024-07-15 ENCOUNTER — Other Ambulatory Visit: Payer: Self-pay

## 2024-07-15 DIAGNOSIS — E870 Hyperosmolality and hypernatremia: Secondary | ICD-10-CM | POA: Diagnosis present

## 2024-07-15 DIAGNOSIS — Z955 Presence of coronary angioplasty implant and graft: Secondary | ICD-10-CM

## 2024-07-15 DIAGNOSIS — Z95810 Presence of automatic (implantable) cardiac defibrillator: Secondary | ICD-10-CM | POA: Diagnosis present

## 2024-07-15 DIAGNOSIS — E46 Unspecified protein-calorie malnutrition: Secondary | ICD-10-CM | POA: Diagnosis present

## 2024-07-15 DIAGNOSIS — I5041 Acute combined systolic (congestive) and diastolic (congestive) heart failure: Secondary | ICD-10-CM | POA: Diagnosis present

## 2024-07-15 DIAGNOSIS — I255 Ischemic cardiomyopathy: Secondary | ICD-10-CM | POA: Diagnosis present

## 2024-07-15 DIAGNOSIS — E872 Acidosis, unspecified: Secondary | ICD-10-CM | POA: Diagnosis present

## 2024-07-15 DIAGNOSIS — N179 Acute kidney failure, unspecified: Secondary | ICD-10-CM | POA: Diagnosis present

## 2024-07-15 DIAGNOSIS — E1122 Type 2 diabetes mellitus with diabetic chronic kidney disease: Secondary | ICD-10-CM | POA: Diagnosis present

## 2024-07-15 DIAGNOSIS — J441 Chronic obstructive pulmonary disease with (acute) exacerbation: Secondary | ICD-10-CM | POA: Diagnosis present

## 2024-07-15 DIAGNOSIS — Z85118 Personal history of other malignant neoplasm of bronchus and lung: Secondary | ICD-10-CM

## 2024-07-15 DIAGNOSIS — J9601 Acute respiratory failure with hypoxia: Secondary | ICD-10-CM | POA: Diagnosis present

## 2024-07-15 DIAGNOSIS — I2510 Atherosclerotic heart disease of native coronary artery without angina pectoris: Secondary | ICD-10-CM | POA: Diagnosis present

## 2024-07-15 DIAGNOSIS — Z681 Body mass index (BMI) 19 or less, adult: Secondary | ICD-10-CM

## 2024-07-15 DIAGNOSIS — E876 Hypokalemia: Principal | ICD-10-CM | POA: Diagnosis present

## 2024-07-15 DIAGNOSIS — I130 Hypertensive heart and chronic kidney disease with heart failure and stage 1 through stage 4 chronic kidney disease, or unspecified chronic kidney disease: Principal | ICD-10-CM | POA: Diagnosis present

## 2024-07-15 DIAGNOSIS — Z9221 Personal history of antineoplastic chemotherapy: Secondary | ICD-10-CM

## 2024-07-15 DIAGNOSIS — Z8639 Personal history of other endocrine, nutritional and metabolic disease: Secondary | ICD-10-CM

## 2024-07-15 DIAGNOSIS — R54 Age-related physical debility: Secondary | ICD-10-CM | POA: Diagnosis present

## 2024-07-15 DIAGNOSIS — E785 Hyperlipidemia, unspecified: Secondary | ICD-10-CM | POA: Diagnosis present

## 2024-07-15 DIAGNOSIS — N183 Chronic kidney disease, stage 3 unspecified: Secondary | ICD-10-CM | POA: Diagnosis present

## 2024-07-15 DIAGNOSIS — N1832 Chronic kidney disease, stage 3b: Secondary | ICD-10-CM | POA: Diagnosis present

## 2024-07-15 DIAGNOSIS — E739 Lactose intolerance, unspecified: Secondary | ICD-10-CM | POA: Diagnosis present

## 2024-07-15 DIAGNOSIS — Z833 Family history of diabetes mellitus: Secondary | ICD-10-CM

## 2024-07-15 DIAGNOSIS — Z1152 Encounter for screening for COVID-19: Secondary | ICD-10-CM

## 2024-07-15 DIAGNOSIS — I480 Paroxysmal atrial fibrillation: Secondary | ICD-10-CM | POA: Diagnosis present

## 2024-07-15 DIAGNOSIS — Z87891 Personal history of nicotine dependence: Secondary | ICD-10-CM

## 2024-07-15 DIAGNOSIS — Z923 Personal history of irradiation: Secondary | ICD-10-CM

## 2024-07-15 DIAGNOSIS — Z66 Do not resuscitate: Secondary | ICD-10-CM | POA: Diagnosis present

## 2024-07-15 DIAGNOSIS — Z8249 Family history of ischemic heart disease and other diseases of the circulatory system: Secondary | ICD-10-CM

## 2024-07-15 DIAGNOSIS — E871 Hypo-osmolality and hyponatremia: Secondary | ICD-10-CM | POA: Diagnosis present

## 2024-07-15 DIAGNOSIS — Z885 Allergy status to narcotic agent status: Secondary | ICD-10-CM

## 2024-07-15 DIAGNOSIS — J9621 Acute and chronic respiratory failure with hypoxia: Secondary | ICD-10-CM | POA: Diagnosis present

## 2024-07-15 DIAGNOSIS — Z5986 Financial insecurity: Secondary | ICD-10-CM

## 2024-07-15 DIAGNOSIS — Z951 Presence of aortocoronary bypass graft: Secondary | ICD-10-CM

## 2024-07-15 DIAGNOSIS — Z79899 Other long term (current) drug therapy: Secondary | ICD-10-CM

## 2024-07-15 DIAGNOSIS — I5043 Acute on chronic combined systolic (congestive) and diastolic (congestive) heart failure: Secondary | ICD-10-CM | POA: Diagnosis present

## 2024-07-15 DIAGNOSIS — Z7901 Long term (current) use of anticoagulants: Secondary | ICD-10-CM

## 2024-07-15 DIAGNOSIS — Z7951 Long term (current) use of inhaled steroids: Secondary | ICD-10-CM

## 2024-07-15 LAB — CBC
HCT: 40.3 % (ref 36.0–46.0)
Hemoglobin: 12.1 g/dL (ref 12.0–15.0)
MCH: 28.3 pg (ref 26.0–34.0)
MCHC: 30 g/dL (ref 30.0–36.0)
MCV: 94.2 fL (ref 80.0–100.0)
Platelets: 156 K/uL (ref 150–400)
RBC: 4.28 MIL/uL (ref 3.87–5.11)
RDW: 17.6 % — ABNORMAL HIGH (ref 11.5–15.5)
WBC: 6.9 K/uL (ref 4.0–10.5)
nRBC: 0 % (ref 0.0–0.2)

## 2024-07-15 LAB — HEPATIC FUNCTION PANEL
ALT: 11 U/L (ref 0–44)
AST: 21 U/L (ref 15–41)
Albumin: 2 g/dL — ABNORMAL LOW (ref 3.5–5.0)
Alkaline Phosphatase: 44 U/L (ref 38–126)
Bilirubin, Direct: 0.2 mg/dL (ref 0.0–0.2)
Indirect Bilirubin: 0.5 mg/dL (ref 0.3–0.9)
Total Bilirubin: 0.7 mg/dL (ref 0.0–1.2)
Total Protein: 3.9 g/dL — ABNORMAL LOW (ref 6.5–8.1)

## 2024-07-15 LAB — BASIC METABOLIC PANEL WITH GFR
Anion gap: 7 (ref 5–15)
BUN: 14 mg/dL (ref 8–23)
CO2: 17 mmol/L — ABNORMAL LOW (ref 22–32)
Calcium: 5.5 mg/dL — CL (ref 8.9–10.3)
Chloride: 122 mmol/L — ABNORMAL HIGH (ref 98–111)
Creatinine, Ser: 0.79 mg/dL (ref 0.44–1.00)
GFR, Estimated: >60 mL/min (ref >60)
Glucose, Bld: 104 mg/dL — ABNORMAL HIGH (ref 70–99)
Potassium: 2.2 mmol/L — CL (ref 3.5–5.1)
Sodium: 146 mmol/L — ABNORMAL HIGH (ref 135–145)

## 2024-07-15 LAB — TROPONIN I (HIGH SENSITIVITY): Troponin I (High Sensitivity): 20 ng/L — ABNORMAL HIGH (ref <18)

## 2024-07-15 LAB — BRAIN NATRIURETIC PEPTIDE: B Natriuretic Peptide: 1303.1 pg/mL — ABNORMAL HIGH (ref 0.0–100.0)

## 2024-07-15 MED ORDER — POTASSIUM CHLORIDE CRYS ER 20 MEQ PO TBCR
40.0000 meq | EXTENDED_RELEASE_TABLET | Freq: Once | ORAL | Status: AC
  Administered 2024-07-16: 40 meq via ORAL
  Filled 2024-07-15: qty 2

## 2024-07-15 MED ORDER — SODIUM CHLORIDE 0.9% FLUSH
3.0000 mL | Freq: Once | INTRAVENOUS | Status: AC
  Administered 2024-07-15: 3 mL via INTRAVENOUS

## 2024-07-15 MED ORDER — POTASSIUM CHLORIDE 10 MEQ/100ML IV SOLN
10.0000 meq | INTRAVENOUS | Status: AC
  Administered 2024-07-16 (×2): 10 meq via INTRAVENOUS
  Filled 2024-07-15 (×2): qty 100

## 2024-07-15 MED ORDER — FUROSCIX 80 MG/10ML ~~LOC~~ CTKT
80.0000 mg | CARTRIDGE | SUBCUTANEOUS | 3 refills | Status: DC
  Filled 2024-07-15: qty 4, 28d supply, fill #0

## 2024-07-15 MED ORDER — CALCIUM GLUCONATE-NACL 2-0.675 GM/100ML-% IV SOLN
2.0000 g | Freq: Once | INTRAVENOUS | Status: AC
  Administered 2024-07-16: 2000 mg via INTRAVENOUS
  Filled 2024-07-15: qty 100

## 2024-07-15 MED ORDER — FUROSEMIDE 10 MG/ML IJ SOLN: 40.0000 mg | Freq: Once | INTRAMUSCULAR | Status: DC

## 2024-07-15 NOTE — ED Provider Notes (Signed)
 [place] EMERGENCY DEPARTMENT AT [facility] Provider Note   CSN: [id number] Arrival date & time: 07/15/24  2134     Patient presents with: Shortness of Breath and Respiratory Distress  HPI Brittany [name]  is a 63 y.o. female with CHF with ICD, COPD, lung cancer, CKD, pAfib on eliquis , CAD s/p CABG x3 presenting for shortness of breath.  She states has been going on for 3 days and gradually getting worse.  It is worse with exertion.  She states she has had some general chest discomfort over the last couple days as well that is intermittent.  No chest pain at this time.  She was brought in by EMS and she requested CPAP initially.  Reports that baseline oxygen requirement at home is 2 L.  Denies any lower extremity edema.  She reports that she still is taking her torsemide  and she recently started a Lasix  patch about 4 weeks ago.  Denies recent vomiting or diarrhea    Shortness of Breath      Prior to Admission medications   Medication Sig Start Date End Date Taking? Authorizing Provider  albuterol  (VENTOLIN  HFA) 108 (90 Base) MCG/ACT inhaler Inhale 2 puffs into the lungs every 6 (six) hours as needed for wheezing or shortness of breath. 06/20/22   Fairy Frames, MD  atorvastatin  (LIPITOR ) 80 MG tablet Take 1 tablet (80 mg total) by mouth daily. Patient not taking: Reported on 06/09/2024 08/06/23 11/20/24  Nicholas Bar, MD  cyclobenzaprine  (FLEXERIL ) 10 MG tablet Take 1 tablet (10 mg total) by mouth 3 (three) times daily as needed for muscle spasms. 10/11/23   Tanda Bleacher, MD  dapagliflozin  propanediol (FARXIGA ) 10 MG TABS tablet Take 1 tablet (10 mg total) by mouth daily. 04/18/24   Bensimhon, Toribio SAUNDERS, MD  digoxin  (LANOXIN ) 0.125 MG tablet Take 1 tablet (125 mcg total) by mouth at bedtime. 04/18/24   Bensimhon, Toribio SAUNDERS, MD  ELIQUIS  2.5 MG TABS tablet TAKE 1 TABLET BY MOUTH TWICE A DAY 12/07/23   Bensimhon, Toribio SAUNDERS, MD  Fluticasone -Umeclidin-Vilant (TRELEGY ELLIPTA )  200-62.5-25 MCG/ACT AEPB Inhale 1 puff into the lungs daily. 02/27/24   Tanda Bleacher, MD  Furosemide  (FUROSCIX ) 80 MG/10ML CTKT Inject 80 mg into the skin as directed. 07/15/24   Glena Harlene HERO, FNP  hydrOXYzine  (VISTARIL ) 25 MG capsule TAKE 1 CAPSULE (25 MG TOTAL) BY MOUTH DAILY AS NEEDED. 07/30/23   Lorren Greig PARAS, NP  ipratropium-albuterol  (DUONEB) 0.5-2.5 (3) MG/3ML SOLN TAKE 3 MLS BY NEBULIZATION EVERY 4 (FOUR) HOURS AS NEEDED (FOR SHORTNESS OF BREATH). 07/08/24   Tanda Bleacher, MD  ivabradine  (CORLANOR ) 7.5 MG TABS tablet TAKE 1 TABLET BY MOUTH TWICE A DAY WITH FOOD 07/08/24   Bensimhon, Toribio SAUNDERS, MD  megestrol  (MEGACE ) 20 MG tablet Take 1 tablet (20 mg total) by mouth 2 (two) times daily. Patient not taking: Reported on 06/09/2024 02/26/24   Tanda Bleacher, MD  metolazone  (ZAROXOLYN ) 5 MG tablet Take 1 tablet (5 mg total) by mouth as needed (as instructed by Advanced HF clinic). 04/18/24   Bensimhon, Daniel R, MD  Multiple Vitamin (MULTIVITAMIN WITH MINERALS) TABS tablet Take 1 tablet by mouth daily.    [provider]  nitroGLYCERIN  (NITROSTAT ) 0.4 MG SL tablet Place 1 tablet (0.4 mg total) under the tongue every 5 (five) minutes as needed for chest pain. 03/13/24   Lee, [place], NP  Nutritional Supplements (ENSURE ORIGINAL) LIQD Take 1 Bottle by mouth 2 (two) times daily after a meal. Drink 1 bottle 2 times  daily.    [provider]  potassium chloride  (KLOR-CON ) 20 MEQ packet Take 40 mEq by mouth 2 (two) times daily. Take an extra 60 meq (3 packets) when you take Metolazone  or furoscix  06/11/24   Glena Harlene HERO, FNP  spironolactone  (ALDACTONE ) 50 MG tablet Take 1 tablet (50 mg total) by mouth daily. 04/07/24   Fairy Frames, MD  torsemide  (DEMADEX ) 100 MG tablet Take 1 tablet (100 mg total) by mouth 2 (two) times daily. 04/07/24   Fairy Frames, MD    Allergies: Lactose intolerance (gi) and Codeine    Review of Systems  Respiratory:  Positive for shortness of breath.      Updated Vital Signs BP 121/71 (BP Location: Right Arm)   Pulse 90   Temp 98.8 F (37.1 C) (Oral)   Resp 20   SpO2 99%   Physical Exam Vitals and nursing note reviewed.  HENT:     Head: Normocephalic and atraumatic.     Mouth/Throat:     Mouth: Mucous membranes are moist.  Eyes:     General:        Right eye: No discharge.        Left eye: No discharge.     Conjunctiva/sclera: Conjunctivae normal.  Cardiovascular:     Rate and Rhythm: Normal rate and regular rhythm.     Pulses: Normal pulses.     Heart sounds: Normal heart sounds.  Pulmonary:     Effort: Pulmonary effort is normal.     Breath sounds: Normal breath sounds. No decreased breath sounds, wheezing, rhonchi or rales.     Comments: On CPAP Abdominal:     General: Abdomen is flat.     Palpations: Abdomen is soft.  Skin:    General: Skin is warm and dry.     Capillary Refill: Capillary refill takes 2 to 3 seconds.  Neurological:     General: No focal deficit present.  Psychiatric:        Mood and Affect: Mood normal.     (all labs ordered are listed, but only abnormal results are displayed) Labs Reviewed  CBC - Abnormal; Notable for the following components:      Result Value   RDW 17.6 (*)    All other components within normal limits  BRAIN NATRIURETIC PEPTIDE - Abnormal; Notable for the following components:   B Natriuretic Peptide 1,303.1 (*)    All other components within normal limits  BASIC METABOLIC PANEL WITH GFR - Abnormal; Notable for the following components:   Sodium 146 (*)    Potassium 2.2 (*)    Chloride 122 (*)    CO2 17 (*)    Glucose, Bld 104 (*)    Calcium  5.5 (*)    All other components within normal limits  HEPATIC FUNCTION PANEL - Abnormal; Notable for the following components:   Total Protein 3.9 (*)    Albumin  2.0 (*)    All other components within normal limits  MAGNESIUM  - Abnormal; Notable for the following components:   Magnesium  3.0 (*)    All other components  within normal limits  TROPONIN I (HIGH SENSITIVITY) - Abnormal; Notable for the following components:   Troponin I (High Sensitivity) 20 (*)    All other components within normal limits  TROPONIN I (HIGH SENSITIVITY) - Abnormal; Notable for the following components:   Troponin I (High Sensitivity) 53 (*)    All other components within normal limits  DIGOXIN  LEVEL    EKG: None  Radiology: DG Chest Portable 1 View Result Date: 07/15/2024 CLINICAL DATA:  Shortness of breath EXAM: PORTABLE CHEST 1 VIEW COMPARISON:  04/02/2024, 12/18/2023, chest CT 02/15/2024, 11/21/2022 FINDINGS: Sternotomy. Right-sided stimulator device with ascending lead. Left-sided single lead pacing device as before. Cardiomegaly. Stable right parahilar distortion and density, probably post therapeutic change, reference CT 02/15/2024. No focal airspace disease, pleural effusion or pneumothorax IMPRESSION: Cardiomegaly with mild central congestion. Stable right parahilar distortion and density, probably post therapeutic change, reference previously performed chest CT. Electronically Signed   By: Luke Bun M.D.   On: 07/15/2024 22:16     .Critical Care  Performed by: Lang Norleen POUR, PA-C Authorized by: Lang Norleen POUR, PA-C   Critical care provider statement:    Critical care time (minutes):  30   Critical care was necessary to treat or prevent imminent or life-threatening deterioration of the following conditions:  Metabolic crisis (Critically low potassium and calcium  requiring IV repletion)   Critical care was time spent personally by me on the following activities:  Development of treatment plan with patient or surrogate, discussions with consultants, evaluation of patient's response to treatment, examination of patient, ordering and review of laboratory studies, ordering and review of radiographic studies, ordering and performing treatments and interventions, pulse oximetry, re-evaluation of patient's condition  and review of old charts    Medications Ordered in the ED  potassium chloride  10 mEq in 100 mL IVPB (10 mEq Intravenous New Bag/Given 07/16/24 0118)  sodium chloride  flush (NS) 0.9 % injection 3 mL (3 mLs Intravenous Given 07/15/24 2219)  potassium chloride  SA (KLOR-CON  M) CR tablet 40 mEq (40 mEq Oral Given 07/16/24 0118)  calcium  gluconate 2 g/ 100 mL sodium chloride  IVPB (2,000 mg Intravenous New Bag/Given 07/16/24 0115)  ipratropium-albuterol  (DUONEB) 0.5-2.5 (3) MG/3ML nebulizer solution 3 mL (3 mLs Nebulization Given 07/16/24 0344)                                    Medical Decision Making Amount and/or Complexity of Data Reviewed Labs: ordered. Radiology: ordered.  Risk Prescription drug management. Decision regarding hospitalization.   Initial Impression and Ddx 62 year old well-appearing female presenting for shortness of breath.  Exam was unremarkable and no obvious evidence of respiratory distress on CPAP.  Transitioned her to her home regiment of 2 L of oxygen via nasal cannula and she did well.  DDx includes CHF exacerbation, COPD exacerbation, ACS, PE, pneumothorax, aortic dissection, pneumonia, other. Patient PMH that increases complexity of ED encounter:  CHF with ICD, COPD, lung cancer, CKD, pAfib on eliquis , CAD s/p CABG x3  Interpretation of Diagnostics - I independent reviewed and interpreted the labs as followed: Hypokalemia 2.2, hypocalcemia 5.5, BNP 1303, 1st troponin 20  - I independently visualized the following imaging with scope of interpretation limited to determining acute life threatening conditions related to emergency care: CXR, which revealed cardiomegaly with mild central congestion  -I personally reviewed and interpreted EKG which did not reveal evidence of ischemia, QTc is 498, significant artifact in the EKG  Patient Reassessment and Ultimate Disposition/Management On reassessment she remained hemodynamically stable in no signs of respiratory distress on  2 L.  Gave IV potassium and calcium .  Given recent addition of another diuretic agent, Furoscix , it is possible that her electrolyte repletion is related to her diuresis.  Admitted to hospital service for hypokalemia, hypocalcemia.  Dr. Alfornia is admitting attending.  Patient management required discussion  with the following services or consulting groups:  Hospitalist Service  Complexity of Problems Addressed Acute complicated illness or Injury  Additional Data Reviewed and Analyzed Further history obtained from: Past medical history and medications listed in the EMR and Prior ED visit notes  Patient Encounter Risk Assessment Consideration of hospitalization      Final diagnoses:  Hypokalemia  Hypocalcemia    ED Discharge Orders     None          Lang Norleen POUR, PA-C 07/16/24 0557    Midge Golas, MD 07/16/24 [phone]

## 2024-07-15 NOTE — ED Provider Notes (Incomplete)
 [place] EMERGENCY DEPARTMENT AT [facility] Provider Note   CSN: [id number] Arrival date & time: 07/15/24  2134     Patient presents with: Shortness of Breath and Respiratory Distress  HPI Brittany [name]  is a 63 y.o. female with CHF with ICD, COPD, lung cancer, CKD, pAfib on eliquis , CAD s/p CABG x3 presenting for shortness of breath.  She states has been going on for 3 days and gradually getting worse.  It is worse with exertion.  She states she has had some general chest discomfort over the last couple days as well that is intermittent.  No chest pain at this time.  She was brought in by EMS and she requested CPAP initially.  Reports that baseline oxygen requirement at home is 2 L.  Denies any lower extremity edema.  {Add pertinent medical, surgical, social history, OB history to HPI:32947}  Shortness of Breath      Prior to Admission medications   Medication Sig Start Date End Date Taking? Authorizing Provider  albuterol  (VENTOLIN  HFA) 108 (90 Base) MCG/ACT inhaler Inhale 2 puffs into the lungs every 6 (six) hours as needed for wheezing or shortness of breath. 06/20/22   Fairy Frames, MD  atorvastatin  (LIPITOR ) 80 MG tablet Take 1 tablet (80 mg total) by mouth daily. Patient not taking: Reported on 06/09/2024 08/06/23 11/20/24  Nicholas Bar, MD  cyclobenzaprine  (FLEXERIL ) 10 MG tablet Take 1 tablet (10 mg total) by mouth 3 (three) times daily as needed for muscle spasms. 10/11/23   Tanda Bleacher, MD  dapagliflozin  propanediol (FARXIGA ) 10 MG TABS tablet Take 1 tablet (10 mg total) by mouth daily. 04/18/24   Bensimhon, Toribio SAUNDERS, MD  digoxin  (LANOXIN ) 0.125 MG tablet Take 1 tablet (125 mcg total) by mouth at bedtime. 04/18/24   Bensimhon, Toribio SAUNDERS, MD  ELIQUIS  2.5 MG TABS tablet TAKE 1 TABLET BY MOUTH TWICE A DAY 12/07/23   Bensimhon, Toribio SAUNDERS, MD  Fluticasone -Umeclidin-Vilant (TRELEGY ELLIPTA ) 200-62.5-25 MCG/ACT AEPB Inhale 1 puff into the lungs daily. 02/27/24    Tanda Bleacher, MD  Furosemide  (FUROSCIX ) 80 MG/10ML CTKT Inject 80 mg into the skin as directed. 07/15/24   Glena Harlene HERO, FNP  hydrOXYzine  (VISTARIL ) 25 MG capsule TAKE 1 CAPSULE (25 MG TOTAL) BY MOUTH DAILY AS NEEDED. 07/30/23   Lorren Greig PARAS, NP  ipratropium-albuterol  (DUONEB) 0.5-2.5 (3) MG/3ML SOLN TAKE 3 MLS BY NEBULIZATION EVERY 4 (FOUR) HOURS AS NEEDED (FOR SHORTNESS OF BREATH). 07/08/24   Tanda Bleacher, MD  ivabradine  (CORLANOR ) 7.5 MG TABS tablet TAKE 1 TABLET BY MOUTH TWICE A DAY WITH FOOD 07/08/24   Bensimhon, Toribio SAUNDERS, MD  megestrol  (MEGACE ) 20 MG tablet Take 1 tablet (20 mg total) by mouth 2 (two) times daily. Patient not taking: Reported on 06/09/2024 02/26/24   Tanda Bleacher, MD  metolazone  (ZAROXOLYN ) 5 MG tablet Take 1 tablet (5 mg total) by mouth as needed (as instructed by Advanced HF clinic). 04/18/24   Bensimhon, Daniel R, MD  Multiple Vitamin (MULTIVITAMIN WITH MINERALS) TABS tablet Take 1 tablet by mouth daily.    [provider]  nitroGLYCERIN  (NITROSTAT ) 0.4 MG SL tablet Place 1 tablet (0.4 mg total) under the tongue every 5 (five) minutes as needed for chest pain. 03/13/24   Lee, [place], NP  Nutritional Supplements (ENSURE ORIGINAL) LIQD Take 1 Bottle by mouth 2 (two) times daily after a meal. Drink 1 bottle 2 times daily.    [provider]  potassium chloride  (KLOR-CON ) 20 MEQ packet Take 40 mEq by  mouth 2 (two) times daily. Take an extra 60 meq (3 packets) when you take Metolazone  or furoscix  06/11/24   [name], Harlene HERO, FNP  spironolactone  (ALDACTONE ) 50 MG tablet Take 1 tablet (50 mg total) by mouth daily. 04/07/24   Fairy Frames, MD  torsemide  (DEMADEX ) 100 MG tablet Take 1 tablet (100 mg total) by mouth 2 (two) times daily. 04/07/24   Fairy Frames, MD    Allergies: Lactose intolerance (gi) and Codeine    Review of Systems  Respiratory:  Positive for shortness of breath.     Updated Vital Signs BP 104/63   Pulse 84   Temp 98.6 F (37 C)    Resp (!) 22   SpO2 100%   Physical Exam  (all labs ordered are listed, but only abnormal results are displayed) Labs Reviewed  CBC - Abnormal; Notable for the following components:      Result Value   RDW 17.6 (*)    All other components within normal limits  BRAIN NATRIURETIC PEPTIDE  BASIC METABOLIC PANEL WITH GFR  HEPATIC FUNCTION PANEL  TROPONIN I (HIGH SENSITIVITY)    EKG: None  Radiology: DG Chest Portable 1 View Result Date: 07/15/2024 CLINICAL DATA:  Shortness of breath EXAM: PORTABLE CHEST 1 VIEW COMPARISON:  04/02/2024, 12/18/2023, chest CT 02/15/2024, 11/21/2022 FINDINGS: Sternotomy. Right-sided stimulator device with ascending lead. Left-sided single lead pacing device as before. Cardiomegaly. Stable right parahilar distortion and density, probably post therapeutic change, reference CT 02/15/2024. No focal airspace disease, pleural effusion or pneumothorax IMPRESSION: Cardiomegaly with mild central congestion. Stable right parahilar distortion and density, probably post therapeutic change, reference previously performed chest CT. Electronically Signed   By: Luke Bun M.D.   On: 07/15/2024 22:16    {Document cardiac monitor, telemetry assessment procedure when appropriate:32947} Procedures   Medications Ordered in the ED  sodium chloride  flush (NS) 0.9 % injection 3 mL (3 mLs Intravenous Given 07/15/24 2219)      {Click here for ABCD2, HEART and other calculators REFRESH Note before signing:1}                              Medical Decision Making Amount and/or Complexity of Data Reviewed Labs: ordered. Radiology: ordered.   ***  {Document critical care time when appropriate  Document review of labs and clinical decision tools ie CHADS2VASC2, etc  Document your independent review of radiology images and any outside records  Document your discussion with family members, caretakers and with consultants  Document social determinants of health affecting pt's  care  Document your decision making why or why not admission, treatments were needed:32947:::1}   Final diagnoses:  None    ED Discharge Orders     None

## 2024-07-15 NOTE — ED Notes (Signed)
 PROVIDER NOTIFIED OF CRITICAL LABS K++2.2, CA 5.5. ORDERS RECEIVED

## 2024-07-15 NOTE — ED Triage Notes (Signed)
 PT BIB EMS FOR RESP DISTRESS. PT HAS CHF AND COPD. EMS STATES UPON ARRIVAL PT ASKED FOR A CPAP, EMS PLACED PT ON CPAP. PT STATES SHE WEARS O2 AS NEEDED.

## 2024-07-16 ENCOUNTER — Other Ambulatory Visit: Payer: Self-pay

## 2024-07-16 ENCOUNTER — Ambulatory Visit: Admitting: Family Medicine

## 2024-07-16 ENCOUNTER — Encounter (HOSPITAL_COMMUNITY): Payer: Self-pay

## 2024-07-16 DIAGNOSIS — J441 Chronic obstructive pulmonary disease with (acute) exacerbation: Secondary | ICD-10-CM

## 2024-07-16 DIAGNOSIS — E785 Hyperlipidemia, unspecified: Secondary | ICD-10-CM | POA: Diagnosis present

## 2024-07-16 DIAGNOSIS — Z85118 Personal history of other malignant neoplasm of bronchus and lung: Secondary | ICD-10-CM

## 2024-07-16 DIAGNOSIS — Z8639 Personal history of other endocrine, nutritional and metabolic disease: Secondary | ICD-10-CM

## 2024-07-16 DIAGNOSIS — E46 Unspecified protein-calorie malnutrition: Secondary | ICD-10-CM | POA: Diagnosis present

## 2024-07-16 DIAGNOSIS — Z833 Family history of diabetes mellitus: Secondary | ICD-10-CM | POA: Diagnosis not present

## 2024-07-16 DIAGNOSIS — J9601 Acute respiratory failure with hypoxia: Secondary | ICD-10-CM

## 2024-07-16 DIAGNOSIS — E870 Hyperosmolality and hypernatremia: Secondary | ICD-10-CM | POA: Diagnosis present

## 2024-07-16 DIAGNOSIS — Z8249 Family history of ischemic heart disease and other diseases of the circulatory system: Secondary | ICD-10-CM | POA: Diagnosis not present

## 2024-07-16 DIAGNOSIS — Z66 Do not resuscitate: Secondary | ICD-10-CM | POA: Diagnosis present

## 2024-07-16 DIAGNOSIS — E872 Acidosis, unspecified: Secondary | ICD-10-CM | POA: Diagnosis present

## 2024-07-16 DIAGNOSIS — Z79899 Other long term (current) drug therapy: Secondary | ICD-10-CM | POA: Diagnosis not present

## 2024-07-16 DIAGNOSIS — Z1152 Encounter for screening for COVID-19: Secondary | ICD-10-CM | POA: Diagnosis not present

## 2024-07-16 DIAGNOSIS — E876 Hypokalemia: Secondary | ICD-10-CM

## 2024-07-16 DIAGNOSIS — I130 Hypertensive heart and chronic kidney disease with heart failure and stage 1 through stage 4 chronic kidney disease, or unspecified chronic kidney disease: Secondary | ICD-10-CM | POA: Diagnosis present

## 2024-07-16 DIAGNOSIS — Z9221 Personal history of antineoplastic chemotherapy: Secondary | ICD-10-CM | POA: Diagnosis not present

## 2024-07-16 DIAGNOSIS — Z87891 Personal history of nicotine dependence: Secondary | ICD-10-CM | POA: Diagnosis not present

## 2024-07-16 DIAGNOSIS — I5041 Acute combined systolic (congestive) and diastolic (congestive) heart failure: Secondary | ICD-10-CM | POA: Diagnosis not present

## 2024-07-16 DIAGNOSIS — N1832 Chronic kidney disease, stage 3b: Secondary | ICD-10-CM | POA: Diagnosis present

## 2024-07-16 DIAGNOSIS — I2510 Atherosclerotic heart disease of native coronary artery without angina pectoris: Secondary | ICD-10-CM | POA: Diagnosis present

## 2024-07-16 DIAGNOSIS — Z681 Body mass index (BMI) 19 or less, adult: Secondary | ICD-10-CM | POA: Diagnosis not present

## 2024-07-16 DIAGNOSIS — E1122 Type 2 diabetes mellitus with diabetic chronic kidney disease: Secondary | ICD-10-CM | POA: Diagnosis present

## 2024-07-16 DIAGNOSIS — Z95810 Presence of automatic (implantable) cardiac defibrillator: Secondary | ICD-10-CM

## 2024-07-16 DIAGNOSIS — I480 Paroxysmal atrial fibrillation: Secondary | ICD-10-CM | POA: Diagnosis present

## 2024-07-16 DIAGNOSIS — E871 Hypo-osmolality and hyponatremia: Secondary | ICD-10-CM

## 2024-07-16 DIAGNOSIS — Z515 Encounter for palliative care: Secondary | ICD-10-CM | POA: Diagnosis not present

## 2024-07-16 DIAGNOSIS — I5043 Acute on chronic combined systolic (congestive) and diastolic (congestive) heart failure: Secondary | ICD-10-CM | POA: Diagnosis present

## 2024-07-16 DIAGNOSIS — J9621 Acute and chronic respiratory failure with hypoxia: Secondary | ICD-10-CM | POA: Diagnosis present

## 2024-07-16 DIAGNOSIS — I5023 Acute on chronic systolic (congestive) heart failure: Secondary | ICD-10-CM | POA: Diagnosis not present

## 2024-07-16 DIAGNOSIS — Z7189 Other specified counseling: Secondary | ICD-10-CM | POA: Diagnosis not present

## 2024-07-16 DIAGNOSIS — Z7901 Long term (current) use of anticoagulants: Secondary | ICD-10-CM | POA: Diagnosis not present

## 2024-07-16 DIAGNOSIS — N179 Acute kidney failure, unspecified: Secondary | ICD-10-CM | POA: Diagnosis present

## 2024-07-16 LAB — TROPONIN I (HIGH SENSITIVITY): Troponin I (High Sensitivity): 53 ng/L — ABNORMAL HIGH (ref <18)

## 2024-07-16 LAB — BASIC METABOLIC PANEL WITH GFR
Anion gap: 11 (ref 5–15)
Anion gap: 12 (ref 5–15)
BUN: 21 mg/dL (ref 8–23)
BUN: 23 mg/dL (ref 8–23)
CO2: 22 mmol/L (ref 22–32)
CO2: 22 mmol/L (ref 22–32)
Calcium: 9.7 mg/dL (ref 8.9–10.3)
Calcium: 9.6 mg/dL (ref 8.9–10.3)
Chloride: 106 mmol/L (ref 98–111)
Chloride: 103 mmol/L (ref 98–111)
Creatinine, Ser: 1.21 mg/dL — ABNORMAL HIGH (ref 0.44–1.00)
Creatinine, Ser: 1.39 mg/dL — ABNORMAL HIGH (ref 0.44–1.00)
GFR, Estimated: 50 mL/min — ABNORMAL LOW (ref >60)
GFR, Estimated: 43 mL/min — ABNORMAL LOW (ref >60)
Glucose, Bld: 213 mg/dL — ABNORMAL HIGH (ref 70–99)
Glucose, Bld: 202 mg/dL — ABNORMAL HIGH (ref 70–99)
Potassium: 4.5 mmol/L (ref 3.5–5.1)
Potassium: 4.4 mmol/L (ref 3.5–5.1)
Sodium: 139 mmol/L (ref 135–145)
Sodium: 137 mmol/L (ref 135–145)

## 2024-07-16 LAB — DIGOXIN LEVEL: Digoxin Level: 0.9 ng/mL (ref 0.8–2.0)

## 2024-07-16 LAB — MAGNESIUM: Magnesium: 3 mg/dL — ABNORMAL HIGH (ref 1.7–2.4)

## 2024-07-16 LAB — GLUCOSE, CAPILLARY: Glucose-Capillary: 141 mg/dL — ABNORMAL HIGH (ref 70–99)

## 2024-07-16 MED ORDER — DIGOXIN 125 MCG PO TABS: 0.0625 mg | ORAL_TABLET | Freq: Every day | ORAL | Status: DC

## 2024-07-16 MED ORDER — HYDROXYZINE PAMOATE 25 MG PO CAPS: 25.0000 mg | ORAL_CAPSULE | Freq: Every day | ORAL | Status: DC | PRN

## 2024-07-16 MED ORDER — INSULIN ASPART 100 UNIT/ML IJ SOLN
0.0000 [IU] | Freq: Three times a day (TID) | INTRAMUSCULAR | Status: DC
  Administered 2024-07-17: 1 [IU] via SUBCUTANEOUS
  Administered 2024-07-17 (×2): 2 [IU] via SUBCUTANEOUS
  Administered 2024-07-18: 7 [IU] via SUBCUTANEOUS
  Administered 2024-07-18 (×2): 2 [IU] via SUBCUTANEOUS
  Administered 2024-07-19: 5 [IU] via SUBCUTANEOUS
  Administered 2024-07-19 (×2): 3 [IU] via SUBCUTANEOUS
  Administered 2024-07-20 (×2): 2 [IU] via SUBCUTANEOUS

## 2024-07-16 MED ORDER — ALBUTEROL SULFATE (2.5 MG/3ML) 0.083% IN NEBU
2.5000 mg | INHALATION_SOLUTION | RESPIRATORY_TRACT | Status: DC | PRN
  Administered 2024-07-16 – 2024-07-17 (×2): 2.5 mg via RESPIRATORY_TRACT
  Filled 2024-07-16 (×2): qty 3

## 2024-07-16 MED ORDER — DIGOXIN 125 MCG PO TABS: 125.0000 ug | ORAL_TABLET | Freq: Every day | ORAL | Status: DC

## 2024-07-16 MED ORDER — DAPAGLIFLOZIN PROPANEDIOL 10 MG PO TABS
10.0000 mg | ORAL_TABLET | Freq: Every day | ORAL | Status: DC
  Administered 2024-07-16 – 2024-07-20 (×5): 10 mg via ORAL
  Filled 2024-07-16 (×5): qty 1

## 2024-07-16 MED ORDER — SPIRONOLACTONE 25 MG PO TABS: 50.0000 mg | ORAL_TABLET | Freq: Every day | ORAL | Status: DC

## 2024-07-16 MED ORDER — METHYLPREDNISOLONE SODIUM SUCC 40 MG IJ SOLR
40.0000 mg | Freq: Two times a day (BID) | INTRAMUSCULAR | Status: DC
  Administered 2024-07-16 – 2024-07-19 (×6): 40 mg via INTRAVENOUS
  Filled 2024-07-16 (×6): qty 1

## 2024-07-16 MED ORDER — DIGOXIN 125 MCG PO TABS
0.0625 mg | ORAL_TABLET | Freq: Every day | ORAL | Status: DC
  Administered 2024-07-17 – 2024-07-20 (×4): 0.0625 mg via ORAL
  Filled 2024-07-16 (×4): qty 1

## 2024-07-16 MED ORDER — ACETAMINOPHEN 325 MG PO TABS: 650.0000 mg | ORAL_TABLET | Freq: Four times a day (QID) | ORAL | Status: DC | PRN

## 2024-07-16 MED ORDER — INSULIN ASPART 100 UNIT/ML IJ SOLN
0.0000 [IU] | Freq: Every day | INTRAMUSCULAR | Status: DC
  Administered 2024-07-19: 3 [IU] via SUBCUTANEOUS

## 2024-07-16 MED ORDER — IPRATROPIUM-ALBUTEROL 0.5-2.5 (3) MG/3ML IN SOLN
3.0000 mL | Freq: Once | RESPIRATORY_TRACT | Status: AC
  Administered 2024-07-16: 3 mL via RESPIRATORY_TRACT
  Filled 2024-07-16: qty 3

## 2024-07-16 MED ORDER — POTASSIUM CHLORIDE 20 MEQ PO PACK: 40.0000 meq | PACK | Freq: Once | ORAL | Status: DC

## 2024-07-16 MED ORDER — SPIRONOLACTONE 25 MG PO TABS
50.0000 mg | ORAL_TABLET | Freq: Every day | ORAL | Status: DC
  Administered 2024-07-16: 50 mg via ORAL
  Filled 2024-07-16 (×2): qty 2

## 2024-07-16 MED ORDER — ATORVASTATIN CALCIUM 80 MG PO TABS
80.0000 mg | ORAL_TABLET | Freq: Every day | ORAL | Status: DC
  Administered 2024-07-18 – 2024-07-19 (×2): 80 mg via ORAL
  Filled 2024-07-16 (×4): qty 1

## 2024-07-16 MED ORDER — IVABRADINE HCL 5 MG PO TABS
7.5000 mg | ORAL_TABLET | Freq: Two times a day (BID) | ORAL | Status: DC
  Filled 2024-07-16: qty 1

## 2024-07-16 MED ORDER — HYDROXYZINE HCL 25 MG PO TABS
25.0000 mg | ORAL_TABLET | Freq: Every day | ORAL | Status: DC | PRN
  Administered 2024-07-16: 25 mg via ORAL
  Filled 2024-07-16: qty 1

## 2024-07-16 MED ORDER — ACETAMINOPHEN 650 MG RE SUPP: 650.0000 mg | Freq: Four times a day (QID) | RECTAL | Status: DC | PRN

## 2024-07-16 MED ORDER — PREDNISONE 20 MG PO TABS: 40.0000 mg | ORAL_TABLET | Freq: Every day | ORAL | Status: DC

## 2024-07-16 MED ORDER — POTASSIUM CHLORIDE 10 MEQ/100ML IV SOLN
10.0000 meq | INTRAVENOUS | Status: AC
  Administered 2024-07-16 (×2): 10 meq via INTRAVENOUS
  Filled 2024-07-16 (×2): qty 100

## 2024-07-16 MED ORDER — POTASSIUM CHLORIDE CRYS ER 20 MEQ PO TBCR
40.0000 meq | EXTENDED_RELEASE_TABLET | Freq: Once | ORAL | Status: DC
Start: 2024-07-16 — End: 2024-07-16
  Filled 2024-07-16: qty 2

## 2024-07-16 MED ORDER — POTASSIUM CHLORIDE CRYS ER 20 MEQ PO TBCR: 40.0000 meq | EXTENDED_RELEASE_TABLET | Freq: Once | ORAL | Status: DC

## 2024-07-16 MED ORDER — IPRATROPIUM-ALBUTEROL 0.5-2.5 (3) MG/3ML IN SOLN
3.0000 mL | Freq: Four times a day (QID) | RESPIRATORY_TRACT | Status: DC
  Administered 2024-07-16 – 2024-07-19 (×9): 3 mL via RESPIRATORY_TRACT
  Filled 2024-07-16 (×11): qty 3

## 2024-07-16 MED ORDER — FUROSEMIDE 10 MG/ML IJ SOLN
80.0000 mg | Freq: Two times a day (BID) | INTRAMUSCULAR | Status: AC
  Administered 2024-07-16 (×2): 80 mg via INTRAVENOUS
  Filled 2024-07-16 (×2): qty 8

## 2024-07-16 MED ORDER — METOLAZONE 5 MG PO TABS: 5.0000 mg | ORAL_TABLET | ORAL | Status: DC

## 2024-07-16 MED ORDER — SODIUM CHLORIDE 0.9% FLUSH
3.0000 mL | Freq: Two times a day (BID) | INTRAVENOUS | Status: DC
  Administered 2024-07-16 – 2024-07-20 (×9): 3 mL via INTRAVENOUS

## 2024-07-16 MED ORDER — IVABRADINE HCL 5 MG PO TABS
7.5000 mg | ORAL_TABLET | Freq: Two times a day (BID) | ORAL | Status: DC
  Administered 2024-07-16 – 2024-07-20 (×8): 7.5 mg via ORAL
  Filled 2024-07-16 (×9): qty 1

## 2024-07-16 MED ORDER — MEGESTROL ACETATE 20 MG PO TABS
20.0000 mg | ORAL_TABLET | Freq: Two times a day (BID) | ORAL | Status: DC
  Administered 2024-07-16 – 2024-07-20 (×8): 20 mg via ORAL
  Filled 2024-07-16 (×10): qty 1

## 2024-07-16 MED ORDER — APIXABAN 2.5 MG PO TABS
2.5000 mg | ORAL_TABLET | Freq: Two times a day (BID) | ORAL | Status: DC
  Administered 2024-07-16 – 2024-07-20 (×9): 2.5 mg via ORAL
  Filled 2024-07-16 (×9): qty 1

## 2024-07-16 NOTE — Plan of Care (Signed)

## 2024-07-16 NOTE — H&P (Signed)
 History and Physical    Patient: Brittany [name]  FMW:[id number] DOB: Apr 17, 1961 DOA: 07/15/2024 DOS: the patient was seen and examined on 07/16/2024 PCP: Tanda Bleacher, MD  Patient coming from: Home  Chief Complaint:  Chief Complaint  Patient presents with   Shortness of Breath   Respiratory Distress   HPI: Brittany [name]  is a 63 y.o. female with medical history significant of hypertension, end-stage cardiomyopathy, chronic systolic CHF, s/p ICD, CAD s/p CABG, PAF, severe COPD, history of small cell lung cancer, protein calorie malnutrition, prior tobacco abuse presented with complaints of difficulty breathing   She has had progressively worsening difficulty breathing over the last 2 to 3 days.   Normally, she uses oxygen only as needed, particularly when experiencing fluid overload.  Her weight has increased slightly recently. She was prescribed diuretic patches to manage fluid retention, and she thinks she did not take enough potassium to cover the patches.  She also has had some wheezing, but denied having a significant coughing. She denies having any leg swelling.   In the emergency department patient was noted to be afebrile with tachypnea, blood pressures maintained, and O2 saturations currently maintained on BiPAP.  Labs significant for BNP 1303.1, high-sensitivity troponin 20-> 53, sodium 146, potassium 2.2, chloride 17, anion calcium  5.5.  Chest x-ray noted cardiomegaly with mild central congestion and stable right perihilar distortion and density.  Patient had been given potassium chloride  40 mEq p.o., potassium chloride  20 mill equivalents IV, calcium  gluconate 2 g IV, and a DuoNeb breathing treatment.   Review of Systems: As mentioned in the history of present illness. All other systems reviewed and are negative. Past Medical History:  Diagnosis Date   Acute respiratory failure (HCC) 05/05/2018   Acute systolic congestive heart failure (HCC) 02/03/2018   AICD  (automatic cardioverter/defibrillator) present    Allergy    Anxiety    Asthma    DM2 (diabetes mellitus, type 2) (HCC) 10/20/2020   Hypertension    PAF (paroxysmal atrial fibrillation) (HCC)    Presence of permanent cardiac pacemaker    Prophylactic measure 08/03/14-08/19/14   Prophyl. 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   LIMA to LAD, SVG to D1, SVG to OM1, EVH via right thigh with implantation of Impella LD LVAD via direct aortic approach   Small cell lung cancer (HCC) 03/16/2014   Past Surgical History:  Procedure Laterality Date   BRONCHIAL BRUSHINGS  10/25/2020   Procedure: BRONCHIAL BRUSHINGS;  Surgeon: Shelah Lamar RAMAN, MD;  Location: [facility] ENDOSCOPY;  Service: Pulmonary;;   BRONCHIAL NEEDLE ASPIRATION BIOPSY  10/25/2020   Procedure: BRONCHIAL NEEDLE ASPIRATION BIOPSIES;  Surgeon: Shelah Lamar RAMAN, MD;  Location: [facility] ENDOSCOPY;  Service: Pulmonary;;   CARDIAC DEFIBRILLATOR PLACEMENT  08/15/2018   MDT Visia AF MRI VR ICD implanted by Dr Zulma for primary prevention of sudden   CESAREAN SECTION     CORONARY ARTERY BYPASS GRAFT N/A 02/03/2018   Procedure: CORONARY ARTERY BYPASS GRAFTING (CABG);  Surgeon: Dusty Sudie DEL, MD;  Location: [facility] OR;  Service: Open Heart Surgery;  Laterality: N/A;  Time 3 using left internal mammary artery and endoscopically harvested right saphenous vein   CORONARY BALLOON ANGIOPLASTY N/A 02/03/2018   Procedure: CORONARY BALLOON ANGIOPLASTY;  Surgeon: Swaziland, Peter M, MD;  Location: [facility] INVASIVE CV LAB;  Service: Cardiovascular;  Laterality: N/A;   CORONARY STENT INTERVENTION N/A 02/12/2019   Procedure: CORONARY STENT INTERVENTION;  Surgeon: Darron Deatrice LABOR, MD;  Location: [facility] INVASIVE CV LAB;  Service: Cardiovascular;  Laterality: N/A;   CORONARY/GRAFT ACUTE MI REVASCULARIZATION N/A 02/03/2018   Procedure: Coronary/Graft Acute MI Revascularization;  Surgeon: Swaziland, Peter M, MD;  Location: [facility] INVASIVE CV LAB;  Service: Cardiovascular;   Laterality: N/A;   ENDOBRONCHIAL ULTRASOUND N/A 10/25/2020   Procedure: ENDOBRONCHIAL ULTRASOUND;  Surgeon: Shelah Lamar RAMAN, MD;  Location: [facility] ENDOSCOPY;  Service: Pulmonary;  Laterality: N/A;   FLEXIBLE BRONCHOSCOPY  10/25/2020   Procedure: FLEXIBLE BRONCHOSCOPY;  Surgeon: Shelah Lamar RAMAN, MD;  Location: [facility] ENDOSCOPY;  Service: Pulmonary;;   IABP INSERTION N/A 02/03/2018   Procedure: IABP Insertion;  Surgeon: Swaziland, Peter M, MD;  Location: [facility] INVASIVE CV LAB;  Service: Cardiovascular;  Laterality: N/A;   INTRAOPERATIVE TRANSESOPHAGEAL ECHOCARDIOGRAM N/A 02/03/2018   Procedure: INTRAOPERATIVE TRANSESOPHAGEAL ECHOCARDIOGRAM;  Surgeon: Dusty Sudie DEL, MD;  Location: [facility] OR;  Service: Open Heart Surgery;  Laterality: N/A;   LEFT HEART CATH AND CORONARY ANGIOGRAPHY N/A 02/03/2018   Procedure: LEFT HEART CATH AND CORONARY ANGIOGRAPHY;  Surgeon: Swaziland, Peter M, MD;  Location: [facility] INVASIVE CV LAB;  Service: Cardiovascular;  Laterality: N/A;   LEFT HEART CATH AND CORS/GRAFTS ANGIOGRAPHY N/A 02/12/2019   Procedure: LEFT HEART CATH AND CORS/GRAFTS ANGIOGRAPHY;  Surgeon: Darron Deatrice LABOR, MD;  Location: [facility] INVASIVE CV LAB;  Service: Cardiovascular;  Laterality: N/A;   MEDIASTINOSCOPY N/A 03/11/2014   Procedure: MEDIASTINOSCOPY;  Surgeon: Elspeth JAYSON Millers, MD;  Location: [facility] OR;  Service: Thoracic;  Laterality: N/A;   PLACEMENT OF IMPELLA LEFT VENTRICULAR ASSIST DEVICE  02/03/2018   Procedure: PLACEMENT OF IMPELLA LEFT VENTRICULAR ASSIST DEVICE LD;  Surgeon: Dusty Sudie DEL, MD;  Location: [facility] OR;  Service: Open Heart Surgery;;   REMOVAL OF IMPELLA LEFT VENTRICULAR ASSIST DEVICE N/A 02/08/2018   Procedure: REMOVAL OF IMPELLA LEFT VENTRICULAR ASSIST DEVICE;  Surgeon: Dusty Sudie DEL, MD;  Location: [facility] OR;  Service: Open Heart Surgery;  Laterality: N/A;   RIGHT HEART CATH N/A 02/03/2018   Procedure: RIGHT HEART CATH;  Surgeon: Swaziland, Peter M, MD;  Location: [facility] INVASIVE CV LAB;  Service: Cardiovascular;   Laterality: N/A;   RIGHT HEART CATH N/A 05/09/2018   Procedure: RIGHT HEART CATH;  Surgeon: Cherrie Toribio SAUNDERS, MD;  Location: [facility] INVASIVE CV LAB;  Service: Cardiovascular;  Laterality: N/A;   TEE WITHOUT CARDIOVERSION N/A 02/08/2018   Procedure: TRANSESOPHAGEAL ECHOCARDIOGRAM (TEE);  Surgeon: Dusty Sudie DEL, MD;  Location: [facility] OR;  Service: Open Heart Surgery;  Laterality: N/A;   TUBAL LIGATION     VIDEO BRONCHOSCOPY WITH ENDOBRONCHIAL ULTRASOUND N/A 03/11/2014   Procedure: VIDEO BRONCHOSCOPY WITH ENDOBRONCHIAL ULTRASOUND;  Surgeon: Elspeth JAYSON Millers, MD;  Location: [facility] OR;  Service: Thoracic;  Laterality: N/A;   Social History:  reports that she quit smoking about 10 years ago. Her smoking use included cigarettes. She started smoking about 30 years ago. She has a 20 pack-year smoking history. She has never used smokeless tobacco. She reports current alcohol use of about 5.0 standard drinks of alcohol per week. She reports current drug use. Drug: Marijuana.  Allergies  Allergen Reactions   Lactose Intolerance (Gi) Diarrhea   Codeine Nausea And Vomiting    Family History  Problem Relation Age of Onset   Heart disease Mother    Hypertension Mother    Heart attack Mother    Hypertension Maternal Grandmother    Cancer Maternal Grandmother    Diabetes Paternal Grandmother    Stroke Neg Hx     Prior to Admission medications   Medication Sig Start Date  End Date Taking? Authorizing Provider  albuterol  (VENTOLIN  HFA) 108 (90 Base) MCG/ACT inhaler Inhale 2 puffs into the lungs every 6 (six) hours as needed for wheezing or shortness of breath. 06/20/22   Fairy Frames, MD  atorvastatin  (LIPITOR ) 80 MG tablet Take 1 tablet (80 mg total) by mouth daily. Patient not taking: Reported on 06/09/2024 08/06/23 11/20/24  Nicholas Bar, MD  cyclobenzaprine  (FLEXERIL ) 10 MG tablet Take 1 tablet (10 mg total) by mouth 3 (three) times daily as needed for muscle spasms. 10/11/23   Tanda Bleacher, MD   dapagliflozin  propanediol (FARXIGA ) 10 MG TABS tablet Take 1 tablet (10 mg total) by mouth daily. 04/18/24   Bensimhon, Toribio SAUNDERS, MD  digoxin  (LANOXIN ) 0.125 MG tablet Take 1 tablet (125 mcg total) by mouth at bedtime. 04/18/24   Bensimhon, Toribio SAUNDERS, MD  ELIQUIS  2.5 MG TABS tablet TAKE 1 TABLET BY MOUTH TWICE A DAY 12/07/23   Bensimhon, Toribio SAUNDERS, MD  Fluticasone -Umeclidin-Vilant (TRELEGY ELLIPTA ) 200-62.5-25 MCG/ACT AEPB Inhale 1 puff into the lungs daily. 02/27/24   Tanda Bleacher, MD  Furosemide  (FUROSCIX ) 80 MG/10ML CTKT Inject 80 mg into the skin as directed. 07/15/24   Glena Harlene HERO, FNP  hydrOXYzine  (VISTARIL ) 25 MG capsule TAKE 1 CAPSULE (25 MG TOTAL) BY MOUTH DAILY AS NEEDED. 07/30/23   Lorren Greig PARAS, NP  ipratropium-albuterol  (DUONEB) 0.5-2.5 (3) MG/3ML SOLN TAKE 3 MLS BY NEBULIZATION EVERY 4 (FOUR) HOURS AS NEEDED (FOR SHORTNESS OF BREATH). 07/08/24   Tanda Bleacher, MD  ivabradine  (CORLANOR ) 7.5 MG TABS tablet TAKE 1 TABLET BY MOUTH TWICE A DAY WITH FOOD 07/08/24   Bensimhon, Toribio SAUNDERS, MD  megestrol  (MEGACE ) 20 MG tablet Take 1 tablet (20 mg total) by mouth 2 (two) times daily. Patient not taking: Reported on 06/09/2024 02/26/24   Tanda Bleacher, MD  metolazone  (ZAROXOLYN ) 5 MG tablet Take 1 tablet (5 mg total) by mouth as needed (as instructed by [facility] clinic). 04/18/24   Bensimhon, Daniel R, MD  Multiple Vitamin (MULTIVITAMIN WITH MINERALS) TABS tablet Take 1 tablet by mouth daily.    [provider]  nitroGLYCERIN  (NITROSTAT ) 0.4 MG SL tablet Place 1 tablet (0.4 mg total) under the tongue every 5 (five) minutes as needed for chest pain. 03/13/24   Lee, [place], NP  Nutritional Supplements (ENSURE ORIGINAL) LIQD Take 1 Bottle by mouth 2 (two) times daily after a meal. Drink 1 bottle 2 times daily.    [provider]  potassium chloride  (KLOR-CON ) 20 MEQ packet Take 40 mEq by mouth 2 (two) times daily. Take an extra 60 meq (3 packets) when you take Metolazone  or furoscix   06/11/24   [name], Harlene HERO, FNP  spironolactone  (ALDACTONE ) 50 MG tablet Take 1 tablet (50 mg total) by mouth daily. 04/07/24   Fairy Frames, MD  torsemide  (DEMADEX ) 100 MG tablet Take 1 tablet (100 mg total) by mouth 2 (two) times daily. 04/07/24   Fairy Frames, MD    Physical Exam: Vitals:   07/16/24 0600 07/16/24 0630 07/16/24 0633 07/16/24 0713  BP: 110/64 107/63    Pulse: 74 79    Resp: 15 14    Temp:   97.7 F (36.5 C)   TempSrc:      SpO2: 99% 100%  100%    Constitutional: Older adult female who appears to be in acute respiratory distress but able to follow commands Eyes: PERRL, lids and conjunctivae normal ENMT: Mucous membranes are moist. Posterior pharynx clear of any exudate or lesions.   Neck: normal,  supple, JVD present.  Angle of jaw Respiratory: Tachypneic with expiratory wheezes heard throughout both lung fields.  O2 saturations currently maintained on 3 L of nasal cannula oxygen. Cardiovascular: Regular rate and rhythm, no murmurs / rubs / gallops. No extremity edema.  Abdomen: no tenderness, no masses palpated. No hepatosplenomegaly. Bowel sounds positive.  Musculoskeletal: no clubbing / cyanosis. No joint deformity upper and lower extremities. Good ROM, no contractures. Normal muscle tone.  Skin: no rashes, lesions, ulcers. No induration Neurologic: CN 2-12 grossly intact. Sensation intact, DTR normal. Strength 5/5 in all 4.  Psychiatric: Normal judgment and insight. Alert and oriented x 3. Normal mood.   Data Reviewed:  EKG revealed significant background artifact.  Reviewed labs, imaging, pertinent records as documented. Assessment and Plan:  Acute respiratory failure with hypoxia  Patient presents with complaints of progressively worsening shortness of breath.  Patient was placed on BiPAP temporarily, but currently has been weaned to nasal cannula oxygen 3 L.  Thought likely factorial secondary to acute on chronic CHF and COPD exacerbation - Admit to a  progressive bed - Continuous pulse oximetry with oxygen to maintain O2 saturations greater than 92% - BiPAP as needed  Acute on chronic systolic and diastolic congestive heart failure S/p ICD Patient reports that her weight is slightly up.  BNP noted to be 1303.1 which is slightly higher than previous.  Echocardiogram noted EF to be less than 20% with grade 2 diastolic dysfunction.  ICD interrogation had noted fluid accumulation from 7/22. - Strict I&O's and daily weights - Lasix  80 mg IV twice daily x 2 doses - Continue Spironolactone  50 mg daily - Continue ivabradine  - Cardiology consulted we will follow-up for any further recommendations  COPD exacerbation On physical exam patient noted to have wheezing lung fields.  Patient had been given Solu-Medrol  125 mg IV. - DuoNebs 4 times daily and albuterol  nebs as needed for shortness of breath/wheezing - Solu-Medrol  40 mg IV twice daily.  De-escalate to prednisone  40 mg daily once medically appropriate  Elevated troponin CAD Chronic High-sensitivity troponins noted to be 20->53 which appears similar to previous elevations in the past. Patient with prior history of two-vessel CABG back in 2019with drug-eluting stent placed back in 2020.  Currently denies having any chest pain. Suspect secondary to demand. - Continue statin  Paroxysmal atrial fibrillation History of renal infarct Digoxin  dose noted to be 0.9 - Continue Eliquis  - Digoxin  dose cut in half  H/o SCLC Completed treatment 2015. CT chest (5/24) soft tissue thickening along mediastinum but no discrete mass   Hypokalemia Hypocalcemia Resolved.  Labs noted potassium 2.2 and calcium  5.5.  Patient had been given 60 meq of potassium chloride .   - Continue to monitor  Hypernatremia Resolved.  Sodium noted to be 146.  Repeat sodium level noted to be 139.  Chronic kidney disease stage IIIb - Continue to monitor kidney function with diuresis  Hyperlipidemia - Continue  atorvastatin   DVT prophylaxis:  Advance Care Planning:   Code Status: Limited: Do not attempt resuscitation (DNR) -DNR-LIMITED -Do Not Intubate/DNI    Consults: Cardiolgy  Family Communication: None  Severity of Illness: The appropriate patient status for this patient is INPATIENT. Inpatient status is judged to be reasonable and necessary in order to provide the required intensity of service to ensure the patient's safety. The patient's presenting symptoms, physical exam findings, and initial radiographic and laboratory data in the context of their chronic comorbidities is felt to place them at high risk for further clinical  deterioration. Furthermore, it is not anticipated that the patient will be medically stable for discharge from the hospital within 2 midnights of admission.   * I certify that at the point of admission it is my clinical judgment that the patient will require inpatient hospital care spanning beyond 2 midnights from the point of admission due to high intensity of service, high risk for further deterioration and high frequency of surveillance required.*  Author: Maximino DELENA Sharps, MD 07/16/2024 7:33 AM  For on call review [email or web address].

## 2024-07-16 NOTE — Progress Notes (Signed)
 Patient was placed on BIPAP per MD order due to respiratory distress. Patient immediately asked to be taken off BIPAP & placed back on NRB stating that she did not feel like she was getting enough air on BIPAP. BIPAP settings were adjusted to try to make the patient more comfortable without success. Rapid response RN came to the bedside as well as night shift RT. BIPAP is at the bedside on standby if needed.RN is aware.

## 2024-07-16 NOTE — Progress Notes (Signed)
 Heart Failure Navigator Progress Note  Assessed for Heart & Vascular TOC clinic readiness.  Patient does not meet criteria due to Advanced Heart Failure Team patient of Dr. [name]. .   Navigator will sign off at this time.   Rhae Hammock, BSN, Scientist, clinical (histocompatibility and immunogenetics) Only

## 2024-07-16 NOTE — ED Notes (Signed)
 SAT 98% ON 2L/[place]

## 2024-07-16 NOTE — ED Notes (Signed)
 PROVIDER NOTIFIED OF CRITICAL LAB: [id number]

## 2024-07-16 NOTE — Progress Notes (Signed)
   07/16/24 2137  BiPAP/CPAP/SIPAP  BiPAP/CPAP/SIPAP Pt Type Adult  BiPAP/CPAP/SIPAP SERVO  Mask Type Full face mask  Dentures removed? Not applicable  Mask Size Medium  Set Rate 15 breaths/min  Respiratory Rate 20 breaths/min  IPAP 15 cmH20  EPAP 5 cmH2O  PEEP 5 cmH20  FiO2 (%) 40 %  Minute Ventilation 12.3  Leak 20  Peak Inspiratory Pressure (PIP) 16  Tidal Volume (Vt) 591  Patient Home Machine No  Patient Home Mask No  Patient Home Tubing No  Auto Titrate No  Press High Alarm 25 cmH2O  CPAP/SIPAP surface wiped down Yes  Device Plugged into RED Power Outlet Yes  BiPAP/CPAP /SiPAP Vitals  Pulse Rate (!) 116  Resp 20  SpO2 100 %  MEWS Score/Color  MEWS Score 2  MEWS Score Color Yellow

## 2024-07-16 NOTE — Progress Notes (Signed)
 MEWS Progress Note  Patient Details Name: Brittany [name]  MRN: [id number] DOB: Jul 29, 1961 Today's Date: 07/16/2024   MEWS Flowsheet Documentation:  Assess: MEWS Score Temp: 98.3 F (36.8 C) BP: (!) 161/96 MAP (mmHg): 117 Pulse Rate: (!) 102 ECG Heart Rate: 96 Resp: 11 Level of Consciousness: Alert SpO2: 97 % O2 Device: Nasal Cannula O2 Flow Rate (L/min): 2 L/min FiO2 (%): 40 % Assess: MEWS Score MEWS Temp: 0 MEWS Systolic: 0 MEWS Pulse: 1 MEWS RR: 1 MEWS LOC: 0 MEWS Score: 2 MEWS Score Color: Yellow Assess: SIRS CRITERIA SIRS Temperature : 0 SIRS Respirations : 0 SIRS Pulse: 1 SIRS WBC: 0 SIRS Score Sum : 1 SIRS Temperature : 0 SIRS Pulse: 1 SIRS Respirations : 0 SIRS WBC: 0 SIRS Score Sum : 1 Assess: if the MEWS score is Yellow or Red Were vital signs accurate and taken at a resting state?: Yes Does the patient meet 2 or more of the SIRS criteria?: No MEWS guidelines implemented : Yes, yellow Treat MEWS Interventions: Considered administering scheduled or prn medications/treatments as ordered Take Vital Signs Increase Vital Sign Frequency : Yellow: Q2hr x1, continue Q4hrs until patient remains green for 12hrs Escalate MEWS: Escalate: Yellow: Discuss with charge nurse and consider notifying provider and/or RRT Notify: Charge Nurse/RN Name of Charge Nurse/RN Notified: [place] Provider Notification Provider Name/Title: Claudene Date Provider Notified: 07/16/24 Time Provider Notified: 1330 Method of Notification: Page Notification Reason: Other (Comment) (MEWS) Provider response: No new orders Date of Provider Response: 07/16/24 Time of Provider Response: 1330      [name] 07/16/2024, 1:30 PM

## 2024-07-16 NOTE — ED Notes (Signed)
 Patient denies pain and is resting comfortably.
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
## 2024-07-17 ENCOUNTER — Other Ambulatory Visit (HOSPITAL_COMMUNITY): Payer: Self-pay | Admitting: Family Medicine

## 2024-07-17 ENCOUNTER — Other Ambulatory Visit: Payer: Self-pay

## 2024-07-17 DIAGNOSIS — Z515 Encounter for palliative care: Secondary | ICD-10-CM

## 2024-07-17 DIAGNOSIS — I5023 Acute on chronic systolic (congestive) heart failure: Secondary | ICD-10-CM

## 2024-07-17 DIAGNOSIS — I480 Paroxysmal atrial fibrillation: Secondary | ICD-10-CM | POA: Diagnosis not present

## 2024-07-17 DIAGNOSIS — Z7189 Other specified counseling: Secondary | ICD-10-CM

## 2024-07-17 DIAGNOSIS — E876 Hypokalemia: Secondary | ICD-10-CM | POA: Diagnosis not present

## 2024-07-17 DIAGNOSIS — Z95810 Presence of automatic (implantable) cardiac defibrillator: Secondary | ICD-10-CM | POA: Diagnosis not present

## 2024-07-17 DIAGNOSIS — J441 Chronic obstructive pulmonary disease with (acute) exacerbation: Secondary | ICD-10-CM | POA: Diagnosis not present

## 2024-07-17 DIAGNOSIS — I5041 Acute combined systolic (congestive) and diastolic (congestive) heart failure: Secondary | ICD-10-CM | POA: Diagnosis not present

## 2024-07-17 DIAGNOSIS — J9601 Acute respiratory failure with hypoxia: Secondary | ICD-10-CM | POA: Diagnosis not present

## 2024-07-17 LAB — BASIC METABOLIC PANEL WITH GFR
Anion gap: 12 (ref 5–15)
Anion gap: 9 (ref 5–15)
BUN: 31 mg/dL — ABNORMAL HIGH (ref 8–23)
BUN: 36 mg/dL — ABNORMAL HIGH (ref 8–23)
CO2: 21 mmol/L — ABNORMAL LOW (ref 22–32)
CO2: 24 mmol/L (ref 22–32)
Calcium: 9.5 mg/dL (ref 8.9–10.3)
Calcium: 9.3 mg/dL (ref 8.9–10.3)
Chloride: 105 mmol/L (ref 98–111)
Chloride: 105 mmol/L (ref 98–111)
Creatinine, Ser: 1.35 mg/dL — ABNORMAL HIGH (ref 0.44–1.00)
Creatinine, Ser: 1.27 mg/dL — ABNORMAL HIGH (ref 0.44–1.00)
GFR, Estimated: 44 mL/min — ABNORMAL LOW (ref >60)
GFR, Estimated: 48 mL/min — ABNORMAL LOW (ref >60)
Glucose, Bld: 175 mg/dL — ABNORMAL HIGH (ref 70–99)
Glucose, Bld: 142 mg/dL — ABNORMAL HIGH (ref 70–99)
Potassium: 4.4 mmol/L (ref 3.5–5.1)
Potassium: 4.2 mmol/L (ref 3.5–5.1)
Sodium: 138 mmol/L (ref 135–145)
Sodium: 138 mmol/L (ref 135–145)

## 2024-07-17 LAB — GLUCOSE, CAPILLARY
Glucose-Capillary: 180 mg/dL — ABNORMAL HIGH (ref 70–99)
Glucose-Capillary: 139 mg/dL — ABNORMAL HIGH (ref 70–99)
Glucose-Capillary: 177 mg/dL — ABNORMAL HIGH (ref 70–99)
Glucose-Capillary: 185 mg/dL — ABNORMAL HIGH (ref 70–99)
Glucose-Capillary: 175 mg/dL — ABNORMAL HIGH (ref 70–99)

## 2024-07-17 LAB — CBC
HCT: 43.7 % (ref 36.0–46.0)
Hemoglobin: 13.4 g/dL (ref 12.0–15.0)
MCH: 28 pg (ref 26.0–34.0)
MCHC: 30.7 g/dL (ref 30.0–36.0)
MCV: 91.2 fL (ref 80.0–100.0)
Platelets: 191 K/uL (ref 150–400)
RBC: 4.79 MIL/uL (ref 3.87–5.11)
RDW: 17.7 % — ABNORMAL HIGH (ref 11.5–15.5)
WBC: 10.4 K/uL (ref 4.0–10.5)
nRBC: 0 % (ref 0.0–0.2)

## 2024-07-17 LAB — URINALYSIS, ROUTINE W REFLEX MICROSCOPIC
Bacteria, UA: NONE SEEN
Bilirubin Urine: NEGATIVE
Glucose, UA: >=500 mg/dL — AB
Hgb urine dipstick: NEGATIVE
Ketones, ur: NEGATIVE mg/dL
Leukocytes,Ua: NEGATIVE
Nitrite: NEGATIVE
Protein, ur: NEGATIVE mg/dL
Specific Gravity, Urine: 1.007 (ref 1.005–1.030)
pH: 6 (ref 5.0–8.0)

## 2024-07-17 LAB — COOXEMETRY PANEL
Carboxyhemoglobin: 1.7 % — ABNORMAL HIGH (ref 0.5–1.5)
Methemoglobin: <0.7 % (ref 0.0–1.5)
O2 Saturation: 78.4 %
Total hemoglobin: 13.2 g/dL (ref 12.0–16.0)

## 2024-07-17 LAB — MAGNESIUM: Magnesium: 2.5 mg/dL — ABNORMAL HIGH (ref 1.7–2.4)

## 2024-07-17 LAB — PROCALCITONIN: Procalcitonin: 0.12 ng/mL

## 2024-07-17 LAB — LACTIC ACID, PLASMA: Lactic Acid, Venous: 2.1 mmol/L (ref 0.5–1.9)

## 2024-07-17 MED ORDER — DOBUTAMINE-DEXTROSE 4-5 MG/ML-% IV SOLN
2.5000 ug/kg/min | INTRAVENOUS | Status: DC
  Administered 2024-07-17: 2.5 ug/kg/min via INTRAVENOUS
  Filled 2024-07-17: qty 250

## 2024-07-17 MED ORDER — METOLAZONE 5 MG PO TABS
2.5000 mg | ORAL_TABLET | Freq: Once | ORAL | Status: AC
  Administered 2024-07-17: 2.5 mg via ORAL
  Filled 2024-07-17: qty 1

## 2024-07-17 MED ORDER — SODIUM CHLORIDE 0.9% FLUSH
10.0000 mL | Freq: Two times a day (BID) | INTRAVENOUS | Status: DC
  Administered 2024-07-17: 10 mL
  Administered 2024-07-18: 20 mL
  Administered 2024-07-18 – 2024-07-19 (×2): 10 mL
  Administered 2024-07-19: 20 mL
  Administered 2024-07-20: 10 mL

## 2024-07-17 MED ORDER — CHLORHEXIDINE GLUCONATE CLOTH 2 % EX PADS
6.0000 | MEDICATED_PAD | Freq: Every day | CUTANEOUS | Status: DC
  Administered 2024-07-17 – 2024-07-20 (×4): 6 via TOPICAL

## 2024-07-17 MED ORDER — SPIRONOLACTONE 25 MG PO TABS
25.0000 mg | ORAL_TABLET | Freq: Every day | ORAL | Status: DC
Start: 2024-07-17 — End: 2024-07-20
  Administered 2024-07-17 – 2024-07-20 (×4): 25 mg via ORAL
  Filled 2024-07-17 (×3): qty 1

## 2024-07-17 MED ORDER — SODIUM CHLORIDE 0.9% FLUSH: 10.0000 mL | INTRAVENOUS | Status: DC | PRN

## 2024-07-17 MED ORDER — FUROSEMIDE 10 MG/ML IJ SOLN
80.0000 mg | Freq: Two times a day (BID) | INTRAMUSCULAR | Status: AC
Start: 2024-07-17 — End: 2024-07-17
  Administered 2024-07-17 (×2): 80 mg via INTRAVENOUS
  Filled 2024-07-17 (×2): qty 8

## 2024-07-17 NOTE — Progress Notes (Signed)
 [facility]                                                                               Brittany [name] , is a 63 y.o. female, DOB - 1961-12-01, FMW:[id number] Admit date - 07/15/2024    Outpatient Primary MD for the patient is Tanda Bleacher, MD  LOS - 1  days    Brief summary  Brittany [name]  is a 63 y.o. female with medical history significant of hypertension, end-stage cardiomyopathy, chronic systolic CHF, s/p ICD, CAD s/p CABG, PAF, severe COPD, history of small cell lung cancer, protein calorie malnutrition, prior tobacco abuse presented with complaints of difficulty breathing     Assessment & Plan    Assessment and Plan:  Acute on chronic respiratory failure with hypoxia sec to acute on chronic combined systolic and diastolic  chf and copd exacerbation.  S/p ICD.  Initially required BIPAP and transitioned to [place] oxygen HHF to keep sats greater than 90% HF team onboard and appreciate input.  Last echo from 03/2024 showed LVEF less than 20%.  Lactic acid elevated at 2.1.will need CVP monitoring to guide diuresis.  Currently on IV lasix  80 mg BID along with spironolactone  25 mg daily.  Continue with farxiga , digoxin  0.065 mg daily, Ivabradine  7.5 mg BID,. She was started on dobutamine  infusion.    Acute copd exacerbation:  On IV steroids and bronchodilators.    Hypokalemia Replaced.   Stage 3b CKD Creatinine at baseline.   Hypertension BP soft.   Protein calorie malnutrition Nutrition consulted.    PAF Rate controlled.  On Eliquis  for anti coagulation.     Estimated body mass index is 20.39 kg/m as calculated from the following:   Height as of this encounter: 4' 11 (1.499 m).   Weight as of this encounter: 45.8 kg.  Code Status: DNR limited.  DVT Prophylaxis:  apixaban  (ELIQUIS ) tablet 2.5 mg Start: 07/16/24 1200 apixaban  (ELIQUIS ) tablet 2.5 mg   Level of Care: Level of care: Progressive Family Communication: none at  bedside.   Disposition Plan:     Remains inpatient appropriate:  for IV diuresis  Procedures:  NONE.   Consultants:   HF team.   Antimicrobials:   Anti-infectives (From admission, onward)    None        Medications  Scheduled Meds:  apixaban   2.5 mg Oral BID   atorvastatin   80 mg Oral QHS   dapagliflozin  propanediol  10 mg Oral Daily   digoxin   0.0625 mg Oral Daily   furosemide   80 mg Intravenous BID   insulin  aspart  0-5 Units Subcutaneous QHS   insulin  aspart  0-9 Units Subcutaneous TID WC   ipratropium-albuterol   3 mL Nebulization QID   ivabradine   7.5 mg Oral BID WC   megestrol   20 mg Oral BID   methylPREDNISolone  (SOLU-MEDROL ) injection  40 mg Intravenous Q12H   sodium chloride  flush  3 mL Intravenous Q12H   spironolactone   25 mg Oral Daily   Continuous Infusions:  DOBUTamine      PRN Meds:.acetaminophen  **OR** acetaminophen , albuterol , hydrOXYzine     Subjective:   Brittany [name]  was seen and examined today.  Adamant about being DNR, still  coughing while talking.   Objective:   Vitals:   07/17/24 0752 07/17/24 0840 07/17/24 1112 07/17/24 1153  BP:  109/61  (!) 110/59  Pulse:  96  (!) 102  Resp:  15  20  Temp:  98 F (36.7 C)  98.2 F (36.8 C)  TempSrc:  Oral  Oral  SpO2: 100% 100% 100% 100%  Weight:      Height:        Intake/Output Summary (Last 24 hours) at 07/17/2024 1406 Last data filed at 07/17/2024 1300 Gross per 24 hour  Intake 120 ml  Output --  Net 120 ml   Filed Weights   07/16/24 1300 07/17/24 0500  Weight: 45.8 kg 45.8 kg     ExamGeneral exam: ill appearing lady on HHF [place]. Respiratory system: diminished at bases. On HHF [place] oxygen. Cardiovascular system: S1 & S2 heard, RRR. No JVD,  Gastrointestinal system: Abdomen is nondistended, soft and nontender.  Central nervous system: Alert and oriented. No focal neurological deficits. Extremities: no pedal edema. Skin: No rashes,  Psychiatry:  Mood & affect appropriate.     Data Reviewed:  I have personally reviewed following labs and imaging studies   CBC Lab Results  Component Value Date   WBC 10.4 07/17/2024   RBC 4.79 07/17/2024   HGB 13.4 07/17/2024   HCT 43.7 07/17/2024   MCV 91.2 07/17/2024   MCH 28.0 07/17/2024   PLT 191 07/17/2024   MCHC 30.7 07/17/2024   RDW 17.7 (H) 07/17/2024   LYMPHSABS 2.1 04/01/2024   MONOABS 1.1 (H) 04/01/2024   EOSABS 0.2 04/01/2024   BASOSABS 0.1 04/01/2024     Last metabolic panel Lab Results  Component Value Date   NA 138 07/17/2024   K 4.4 07/17/2024   CL 105 07/17/2024   CO2 21 (L) 07/17/2024   BUN 31 (H) 07/17/2024   CREATININE 1.35 (H) 07/17/2024   GLUCOSE 175 (H) 07/17/2024   GFRNONAA 44 (L) 07/17/2024   GFRAA >60 05/24/2020   CALCIUM  9.5 07/17/2024   PHOS 3.0 02/19/2024   PROT 3.9 (L) 07/15/2024   ALBUMIN  2.0 (L) 07/15/2024   LABGLOB 2.5 07/19/2021   AGRATIO 1.9 07/19/2021   BILITOT 0.7 07/15/2024   ALKPHOS 44 07/15/2024   AST 21 07/15/2024   ALT 11 07/15/2024   ANIONGAP 12 07/17/2024    CBG (last 3)  Recent Labs    07/16/24 2115 07/17/24 0611 07/17/24 1152  GLUCAP 141* 180* 139*      Coagulation Profile: No results for input(s): INR, PROTIME in the last 168 hours.   Radiology Studies: US  EKG SITE RITE Result Date: 07/17/2024 If Site Rite image not attached, placement could not be confirmed due to current cardiac rhythm.  DG Chest Portable 1 View Result Date: 07/15/2024 CLINICAL DATA:  Shortness of breath EXAM: PORTABLE CHEST 1 VIEW COMPARISON:  04/02/2024, 12/18/2023, chest CT 02/15/2024, 11/21/2022 FINDINGS: Sternotomy. Right-sided stimulator device with ascending lead. Left-sided single lead pacing device as before. Cardiomegaly. Stable right parahilar distortion and density, probably post therapeutic change, reference CT 02/15/2024. No focal airspace disease, pleural effusion or pneumothorax IMPRESSION: Cardiomegaly with mild central congestion. Stable right  parahilar distortion and density, probably post therapeutic change, reference previously performed chest CT. Electronically Signed   By: Luke Bun M.D.   On: 07/15/2024 22:16       Elgie Butter M.D. [facility] 07/17/2024, 2:06 PM  Available via Epic secure chat 7am-7pm After 7 pm, please refer to night coverage provider listed on  amion.

## 2024-07-17 NOTE — Progress Notes (Signed)
 Peripherally Inserted Central Catheter Placement  The IV Nurse has discussed with the patient and/or persons authorized to consent for the patient, the purpose of this procedure and the potential benefits and risks involved with this procedure.  The benefits include less needle sticks, lab draws from the catheter, and the patient may be discharged home with the catheter. Risks include, but not limited to, infection, bleeding, blood clot (thrombus formation), and puncture of an artery; nerve damage and irregular heartbeat and possibility to perform a PICC exchange if needed/ordered by physician.  Alternatives to this procedure were also discussed.  Bard Power PICC patient education guide, fact sheet on infection prevention and patient information card has been provided to patient /or left at bedside.    PICC Placement Documentation  PICC Double Lumen 07/17/24 Right Brachial 36 cm 0 cm (Active)  Indication for Insertion or Continuance of Line Chronic illness with exacerbations (CF, Sickle Cell, etc.) 07/17/24 1559  Exposed Catheter (cm) 0 cm 07/17/24 1559  Site Assessment Clean, Dry, Intact 07/17/24 1559  Lumen #1 Status Flushed;Blood return noted;Saline locked 07/17/24 1559  Lumen #2 Status Flushed;Blood return noted;Saline locked 07/17/24 1559  Dressing Type Transparent 07/17/24 1559  Dressing Status Antimicrobial disc/dressing in place 07/17/24 1559  Line Care Connections checked and tightened 07/17/24 1559  Line Adjustment (NICU/IV Team Only) No 07/17/24 1559  Dressing Intervention New dressing 07/17/24 1559  Dressing Change Due 07/24/24 07/17/24 1559       Brittany [name] 07/17/2024, 4:01 PM

## 2024-07-17 NOTE — Consult Note (Signed)
 Palliative Care Consult Note                                  Date: 07/17/2024   Patient Name: Brittany [name]   DOB: 03-16-1961  MRN: [id number]  Age / Sex: 63 y.o., female  PCP: Tanda Bleacher, MD Referring Physician: Cherlyn Labella, MD  Reason for Consultation: Establishing goals of care  HPI/Patient Profile: 63 y.o. female  with past medical history of HFrEF, CAD s/p emergent CABG in 2019 and DES in 2020, paroxysmal atrial fibrillation, COPD, CKD stage IIIb, history of renal infarct 2022, and previous small cell lung cancer treated with chemo and radiation.  She presented to the ED on 07/15/2024 with respiratory distress and is admitted with acute on chronic CHF, acute on chronic respiratory failure, and hypokalemia.   Past Medical History:  Diagnosis Date   Acute respiratory failure (HCC) 05/05/2018   Acute systolic congestive heart failure (HCC) 02/03/2018   AICD (automatic cardioverter/defibrillator) present    Allergy    Anxiety    Asthma    DM2 (diabetes mellitus, type 2) (HCC) 10/20/2020   Hypertension    PAF (paroxysmal atrial fibrillation) (HCC)    Presence of permanent cardiac pacemaker    Prophylactic measure 08/03/14-08/19/14   Prophyl. 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   LIMA to LAD, SVG to D1, SVG to OM1, EVH via right thigh with implantation of Impella LD LVAD via direct aortic approach   Small cell lung cancer (HCC) 03/16/2014    Palliative Medicine has been consulted for goals of care discussions. Patient and family are faced with anticipatory care needs and complex medical decision making.   Clinical Assessment and Goals of Care:   Extensive chart review has been completed including labs, vital signs, imaging, progress/consult notes, orders, medications and available advance directive documents.    I met with *** to discuss diagnosis, prognosis, GOC, EOL wishes, disposition, and  options.  Patient is known to PMT from previous hospitalizations.  I re-introduced Palliative Medicine as specialized medical care for people living with serious illness. It focuses on providing relief from the symptoms and stress of a serious illness.   Created space and opportunity for patient and family to express thoughts and feelings regarding current medical situation. Values and goals of care were attempted to be elicited.  A discussion was had today regarding advanced directives. We discussed code status and scopes of care. We discussed the difference between full scope versus limited interventions versus comfort care. The MOST form was introduced and discussed.   Life Review: ***  Functional Status: ***  Patient/Family Understanding of Illness: ***  Discussion: *** We discussed patient's current illness and what it means in the larger context of his/her ongoing co-morbidities. Current clinical status was reviewed. Natural disease trajectory of *** was discussed.  Discussed the importance of continued conversation with the medical team regarding overall plan of care and treatment options, ensuring decisions are within the context of the patients values and GOCs.  Questions and concerns addressed. Patient/family encouraged to call with questions or concerns.   Review of Systems  Constitutional:  Positive for fatigue.    Objective:   Primary Diagnoses: Present on Admission:  Acute respiratory failure with hypoxia (HCC)  Acute combined systolic and diastolic CHF, NYHA class 4 (HCC)  COPD with acute exacerbation (HCC)  CAD (coronary artery disease)  PAF (paroxysmal atrial fibrillation) (  HCC)  ICD (implantable cardioverter-defibrillator) in place  Hyponatremia  CKD stage 3b, GFR 30-44 ml/min (HCC)   Physical Exam Vitals reviewed.  Constitutional:      General: She is not in acute distress.    Comments: Chronically ill-appearing  Neurological:     Mental Status:  She is alert.     Vital Signs:  BP 117/64 (BP Location: Left Arm)   Pulse (!) 101   Temp 98.1 F (36.7 C) (Oral)   Resp (!) 23   Ht 4' 11 (1.499 m)   Wt 45.8 kg   SpO2 100%   BMI 20.39 kg/m   Palliative Assessment/Data: ***     Assessment & Plan:   SUMMARY OF RECOMMENDATIONS   ***  Primary Decision Maker: {Primary Decision Fjxzm:78612}  Existing Vynca/ACP Documentation:   Code Status/Advance Care Planning: {Palliative Code status:23503}  Symptom Management:  ***  Prognosis:  {Palliative Care Prognosis:23504}  Discharge Planning:  {Palliative dispostion:23505}   Discussed with: ***    Thank you for allowing us  to participate in the care of Brittany [name]    Time Total: ***  Greater than 50%  of this time was spent counseling and coordinating care related to the above assessment and plan.  Signed by: Recardo Loll, NP Palliative Medicine Team  Team Phone # [phone]  For individual providers, please see AMION

## 2024-07-17 NOTE — Plan of Care (Signed)

## 2024-07-17 NOTE — Progress Notes (Signed)
 Pt alert and oriented. Able to tolerate Bipap for about and hour then went back to [place] oxygen. Encouraged to continue trying to use BiPap. No other concerns voiced

## 2024-07-17 NOTE — Progress Notes (Addendum)
 Advanced Heart Failure Rounding Note  Cardiologist: Dr. Cherrie  Chief Complaint: acute on chronic hypoxic respiratory failure 2/2 CHF and COPDE    Patient Profile   Brittany [name]  is a 63 y.o. 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  Subjective:    Developed respiratory distress yesterday evening and placed on BiPAP. Now on HHF[place] at 10L/min (home baseline 3L/min)   Getting IV steroids + nebs. Has dry cough.   Got 2 doses of IV Lasix  yesterday. I/Os incomplete   SCr  1.21>>1.39>>1.35 K 4.4   BP soft in the 90s systolic.   Has rigors. Complains of dysuria. Afebrile. WBC 6.9>>10.4.   Objective:   Weight Range: 45.8 kg Body mass index is 20.39 kg/m.   Vital Signs:   Temp:  [97.6 F (36.4 C)-98.4 F (36.9 C)] 98 F (36.7 C) (08/07 0840) Pulse Rate:  [94-116] 96 (08/07 0840) Resp:  [15-20] 15 (08/07 0840) BP: (108-134)/(61-83) 109/61 (08/07 0840) SpO2:  [96 %-100 %] 100 % (08/07 0840) FiO2 (%):  [40 %-100 %] 40 % (08/06 2137) Weight:  [45.8 kg] 45.8 kg (08/07 0500) Last BM Date : 07/16/24  Weight change: Filed Weights   07/16/24 1300 07/17/24 0500  Weight: 45.8 kg 45.8 kg    Intake/Output:   Intake/Output Summary (Last 24 hours) at 07/17/2024 1011 Last data filed at 07/16/2024 1300 Gross per 24 hour  Intake 540 ml  Output --  Net 540 ml      Physical Exam    General: weak/frail and chronically ill appearing, coughing on exam  Neck: JVD elevated to jaw  Cor: RRR  Lungs: decreased BS bilaterally, faint bibasilar crackles  Abdomen: Soft, nontender, nondistended.  Extremities: no LEE, distal ext cool bilaterally   Telemetry   NSR 90s, + barostim  artifact   EKG    N/A   Labs    CBC Recent Labs    07/15/24 2131 07/17/24 0337  WBC 6.9 10.4  HGB 12.1 13.4  HCT 40.3 43.7  MCV 94.2 91.2  PLT 156 191   Basic Metabolic Panel Recent Labs    91/93/74 0024 07/16/24 0800 07/16/24 1356 07/17/24 0337  NA  --    < > 137 138  K  --    < > 4.4 4.4  CL  --    < > 103 105  CO2  --    < > 22 21*  GLUCOSE  --    < > 202* 175*  BUN  --    < > 23 31*  CREATININE  --    < > 1.39* 1.35*  CALCIUM   --    < > 9.6 9.5  MG 3.0*  --   --  2.5*   < > = values in this interval not displayed.   Liver Function Tests Recent Labs    07/15/24 2250  AST 21  ALT 11  ALKPHOS 44  BILITOT 0.7  PROT 3.9*  ALBUMIN  2.0*   No results for input(s): LIPASE, AMYLASE in the last 72 hours. Cardiac Enzymes No results for input(s): CKTOTAL, CKMB, CKMBINDEX, TROPONINI in the last 72 hours.  BNP: BNP (last 3 results) Recent Labs  04/01/24 0625 06/09/24 1403 07/15/24 2131  BNP 4,049.6* 1,214.2* 1,303.1*    ProBNP (last 3 results) No results for input(s): PROBNP in the last 8760 hours.   D-Dimer No results for input(s): DDIMER in the last 72 hours. Hemoglobin A1C No results for input(s): HGBA1C in the last 72 hours. Fasting Lipid Panel No results for input(s): CHOL, HDL, LDLCALC, TRIG, CHOLHDL, LDLDIRECT in the last 72 hours. Thyroid  Function Tests No results for input(s): TSH, T4TOTAL, T3FREE, THYROIDAB in the last 72 hours.  Invalid input(s): FREET3  Other results:   Imaging    No results found.   Medications:     Scheduled Medications:  apixaban   2.5 mg Oral BID   atorvastatin   80 mg Oral QHS   dapagliflozin  propanediol  10 mg Oral Daily   digoxin   0.0625 mg Oral Daily   insulin  aspart  0-5 Units Subcutaneous QHS   insulin  aspart  0-9 Units Subcutaneous TID WC   ipratropium-albuterol   3 mL Nebulization QID   ivabradine   7.5 mg Oral BID WC   megestrol   20 mg Oral BID    methylPREDNISolone  (SOLU-MEDROL ) injection  40 mg Intravenous Q12H   sodium chloride  flush  3 mL Intravenous Q12H   spironolactone   50 mg Oral Daily    Infusions:   PRN Medications: acetaminophen  **OR** acetaminophen , albuterol , hydrOXYzine    Assessment/Plan   1. Acute on Chronic Systolic Heart Failure - HF felt to be end-stage. EF has been 20-25% for the last 5 years.  - S/p Medtronic ICD and Barostim - Last Echo 4/25: EF < 20%, RV normal  - Numerous hospitalizations over past 18 months  - Now readmitted w/ NYHA IIIb symptoms and volume overload. Pt attributes a/c dyspnea to anxiety/panic attack but she has clear volume overload on exam. Recent device interrogation 8/4 also demonstrating ongoing fluid accumulation since 7/22. Admit BNP 1300  - now w/ concern for low output. Poor response to IV Lasix . C/w volume overload. Suspect she may need inotropic support to augment diuresis (required DBA prior admission) - will obtain STAT Lactic acid and will place PICC to help w/ CVP monitoring to help guide diuresis + follow co-oxes  - Continue IV Lasix  80 mg bid. Add metolazone  2.5 mg x 1  - Reduce spiro to 25 mg daily w/ soft BP - continue Farxiga  10 mg daily  - continue ivabradine  7.5 mg bid - Dig level elevated at 0.9 on admit. Continue on reduced dose, 0.0625 mg daily  - she is not a candidate for advanced therapies. We discussed consideration of palliative support previous hospitalization but pt not yet ready for this.   2. Hypokalemia - K 2.2 on admit. Pt did not increase home KCl dose w/ Furoscix   - K improved to 4.4 w/ K supp  - supp PRN w/ ongoing diuresis w/ IV Lasix  - continue Spiro 25 mg daily    3. A/c Hypoxic Respiratory Failure - multifactorial 2/2 a/c CHF and ACOPDE - diuresis per above - continue home O2 - treatment of COPDE per IM    3. CKD Stage IIIb  - Creatinine baseline ~ 1.4-1.7 - SCr 1.2 on admit>>1.4 today  - Continue SGLT2i - monitor w/ diuresis    4.  PAF  - Regular on exam today and by EKG  - Continue Eliquis  2.5 mg bid. Reduced dose (weight/creatinine).   5. CAD - History of CABG x 2 2019 - s/p DES ostial ramus extending to left main (2020) - denies CP  -  Continue atorvastatin  80 mg daily - No ASA with need for AC   6. H/o SCLC - Completed treatment 2015.  - CT chest (5/24) soft tissue thickening along mediastinum but no discrete mass    7. H/o Renal Infarct 2022 - On Eliquis . No bleeding issues.  8. ID - concern for possible infectious process, pt w/ rigors on exam. WBC 6>>10 (lV steroids). Endorses dysuria  - check UA and PCT     Length of Stay: 1  Brittainy Simmons, PA-C  07/17/2024, 10:11 AM  Advanced Heart Failure Team Pager [phone] (M-F; 7a - 5p)  Please contact [facility] for night-coverage after hours (5p -7a ) and weekends on [email or web address]  Patient seen with PA, I formulated the plan and agree with the above note.   I/Os not recorded.  She is still short of breath.  Less wheezing.  Now on Solumedrol IV and nebs.  SBP 90s-100s.  Lactate checked today, mildly elevated at 2.1, creatinine 1.21 => 1.35.   General: NAD Neck: JVP 14 cm, no thyromegaly or thyroid  nodule.  Lungs: Distant BS CV: Lateral PMI.  Heart regular S1/S2, no S3/S4, no murmur.  No peripheral edema.   Abdomen: Soft, nontender, no hepatosplenomegaly, no distention.  Skin: Intact without lesions or rashes.  Neurologic: Alert and oriented x 3.  Psych: Normal affect. Extremities: No clubbing or cyanosis.  HEENT: Normal.   End stage ischemic cardiomyopathy, not candidate for advanced therapies with lung disease.  She has a barostimulator device.  Recurrent admission with volume overload despite aggressive outpatient management. She is volume overloaded, has not diuresed well yet.  Lactate elevated at 2.1.  Situation complicated by suspected AECOPD.  - Place PICC, follow CVP and co-ox.  - Given BP on the low side and elevated lactate, start  dobutamine  2.5 mcg/kg/min.  - Continue Lasix  80 mg IV bid and will give a dose of metolazone  2.5 x 1.  - Continue Farxiga , ivabradine , spironolactone  (dose decreased to 25 mg daily).  - Decreased digoxin  to 0.0625 daily.    AECOPD treated with IV steroids and bronchodilators.   Poor long-term prognosis.  GOC conversation would be appropriate. Consult palliative care.   Ezra Shuck 07/17/2024 12:38 PM

## 2024-07-18 DIAGNOSIS — I480 Paroxysmal atrial fibrillation: Secondary | ICD-10-CM | POA: Diagnosis not present

## 2024-07-18 DIAGNOSIS — I5023 Acute on chronic systolic (congestive) heart failure: Secondary | ICD-10-CM | POA: Diagnosis not present

## 2024-07-18 DIAGNOSIS — J9601 Acute respiratory failure with hypoxia: Secondary | ICD-10-CM | POA: Diagnosis not present

## 2024-07-18 DIAGNOSIS — E876 Hypokalemia: Secondary | ICD-10-CM | POA: Diagnosis not present

## 2024-07-18 DIAGNOSIS — I5041 Acute combined systolic (congestive) and diastolic (congestive) heart failure: Secondary | ICD-10-CM | POA: Diagnosis not present

## 2024-07-18 DIAGNOSIS — Z95810 Presence of automatic (implantable) cardiac defibrillator: Secondary | ICD-10-CM | POA: Diagnosis not present

## 2024-07-18 LAB — COOXEMETRY PANEL
Carboxyhemoglobin: 1.7 % — ABNORMAL HIGH (ref 0.5–1.5)
Methemoglobin: 0.8 % (ref 0.0–1.5)
O2 Saturation: 81.3 %
Total hemoglobin: 13.2 g/dL (ref 12.0–16.0)

## 2024-07-18 LAB — BASIC METABOLIC PANEL WITH GFR
Anion gap: 12 (ref 5–15)
BUN: 49 mg/dL — ABNORMAL HIGH (ref 8–23)
CO2: 29 mmol/L (ref 22–32)
Calcium: 10 mg/dL (ref 8.9–10.3)
Chloride: 102 mmol/L (ref 98–111)
Creatinine, Ser: 1.43 mg/dL — ABNORMAL HIGH (ref 0.44–1.00)
GFR, Estimated: 41 mL/min — ABNORMAL LOW (ref >60)
Glucose, Bld: 180 mg/dL — ABNORMAL HIGH (ref 70–99)
Potassium: 4.3 mmol/L (ref 3.5–5.1)
Sodium: 143 mmol/L (ref 135–145)

## 2024-07-18 LAB — GLUCOSE, CAPILLARY
Glucose-Capillary: 230 mg/dL — ABNORMAL HIGH (ref 70–99)
Glucose-Capillary: 198 mg/dL — ABNORMAL HIGH (ref 70–99)
Glucose-Capillary: 156 mg/dL — ABNORMAL HIGH (ref 70–99)
Glucose-Capillary: 307 mg/dL — ABNORMAL HIGH (ref 70–99)
Glucose-Capillary: 176 mg/dL — ABNORMAL HIGH (ref 70–99)

## 2024-07-18 LAB — CBC
HCT: 39.4 % (ref 36.0–46.0)
Hemoglobin: 12.5 g/dL (ref 12.0–15.0)
MCH: 28.2 pg (ref 26.0–34.0)
MCHC: 31.7 g/dL (ref 30.0–36.0)
MCV: 88.7 fL (ref 80.0–100.0)
Platelets: 167 K/uL (ref 150–400)
RBC: 4.44 MIL/uL (ref 3.87–5.11)
RDW: 17.2 % — ABNORMAL HIGH (ref 11.5–15.5)
WBC: 8 K/uL (ref 4.0–10.5)
nRBC: 0 % (ref 0.0–0.2)

## 2024-07-18 MED ORDER — METHOCARBAMOL 500 MG PO TABS
500.0000 mg | ORAL_TABLET | Freq: Three times a day (TID) | ORAL | Status: DC | PRN
  Administered 2024-07-18: 500 mg via ORAL
  Filled 2024-07-18: qty 1

## 2024-07-18 MED ORDER — FUROSEMIDE 10 MG/ML IJ SOLN
80.0000 mg | Freq: Once | INTRAMUSCULAR | Status: AC
  Administered 2024-07-18: 80 mg via INTRAVENOUS
  Filled 2024-07-18: qty 8

## 2024-07-18 MED ORDER — TORSEMIDE 20 MG PO TABS
100.0000 mg | ORAL_TABLET | Freq: Two times a day (BID) | ORAL | Status: DC
  Administered 2024-07-18 – 2024-07-19 (×2): 100 mg via ORAL
  Filled 2024-07-18 (×2): qty 5

## 2024-07-18 NOTE — Progress Notes (Signed)
 Patient ID: Brittany [name] , female   DOB: Jan 09, 1961, 63 y.o.   MRN: [id number]     Advanced Heart Failure Rounding Note  Cardiologist: Dr. Cherrie  Chief Complaint: acute on chronic hypoxic respiratory failure 2/2 CHF and COPDE    Patient Profile   Brittany [name]  is a 63 y.o. 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  Subjective:    Good diuresis yesterday and breathing better.  Back on nasal cannula (home baseline 3L/min). I/Os net negative 1148, weight down.  Co-ox 81% now on dobutamine  2.5 mcg/kg/min.  Co-ox 81%. Creatinine 1.27 => 1.43. SBP 90s-110s.   Getting IV steroids + nebs.     Objective:   Weight Range: 40.2 kg Body mass index is 17.9 kg/m.   Vital Signs:   Temp:  [98 F (36.7 C)-98.5 F (36.9 C)] 98.2 F (36.8 C) (08/08 0429) Pulse Rate:  [88-103] 103 (08/08 0700) Resp:  [12-26] 15 (08/08 0700) BP: (98-121)/(40-64) 99/49 (08/08 0700) SpO2:  [100 %] 100 % (08/08 0700) Weight:  [40.2 kg] 40.2 kg (08/08 0435) Last BM Date : 07/16/24  Weight change: Filed Weights   07/16/24 1300 07/17/24 0500 07/18/24 0435  Weight: 45.8 kg 45.8 kg 40.2 kg    Intake/Output:   Intake/Output Summary (Last 24 hours) at 07/18/2024 9177 Last data filed at 07/18/2024 0700 Gross per 24 hour  Intake 927.13 ml  Output 2075 ml  Net -1147.87 ml      Physical Exam    General: Frail.  Neck: JVP 8 cm, no thyromegaly or thyroid  nodule.  Lungs: Clear to auscultation bilaterally with normal respiratory effort. CV: Lateral.  Heart regular S1/S2, no S3/S4, no murmur.  No peripheral edema.   Abdomen: Soft, nontender, no hepatosplenomegaly, no distention.  Skin: Intact without  lesions or rashes.  Neurologic: Alert and oriented x 3.  Psych: Normal affect. Extremities: No clubbing or cyanosis.  HEENT: Normal.   Telemetry   NSR 90s, + barostim artifact (personally reviewed)  EKG    N/A   Labs    CBC Recent Labs    07/17/24 0337 07/18/24 0417  WBC 10.4 8.0  HGB 13.4 12.5  HCT 43.7 39.4  MCV 91.2 88.7  PLT 191 167   Basic Metabolic Panel Recent Labs    91/93/74 0024 07/16/24 0800 07/17/24 0337 07/17/24 1435 07/18/24 0417  NA  --    < > 138 138 143  K  --    < > 4.4 4.2 4.3  CL  --    < > 105 105 102  CO2  --    < > 21* 24 29  GLUCOSE  --    < > 175* 142* 180*  BUN  --    < > 31* 36* 49*  CREATININE  --    < > 1.35* 1.27* 1.43*  CALCIUM   --    < > 9.5 9.3 10.0  MG 3.0*  --  2.5*  --   --    < > = values in this interval not displayed.   Liver Function Tests Recent Labs    07/15/24 2250  AST 21  ALT 11  ALKPHOS 44  BILITOT 0.7  PROT 3.9*  ALBUMIN  2.0*   No results for input(s): LIPASE, AMYLASE in the last 72 hours. Cardiac Enzymes No results for input(s): CKTOTAL, CKMB, CKMBINDEX, TROPONINI in the last 72 hours.  BNP: BNP (last 3 results) Recent Labs    04/01/24 0625 06/09/24 1403 07/15/24 2131  BNP 4,049.6* 1,214.2* 1,303.1*    ProBNP (last 3 results) No results for input(s): PROBNP in the last 8760 hours.   D-Dimer No results for input(s): DDIMER in the last 72 hours. Hemoglobin A1C No results for input(s): HGBA1C in the last 72 hours. Fasting Lipid Panel No results for input(s): CHOL, HDL, LDLCALC, TRIG, CHOLHDL, LDLDIRECT in the last 72 hours. Thyroid  Function Tests No results for input(s): TSH, T4TOTAL, T3FREE, THYROIDAB in the last 72 hours.  Invalid input(s): FREET3  Other results:   Imaging    US  EKG SITE RITE Result Date: 07/17/2024 If Site Rite image not attached, placement could not be confirmed due to current cardiac rhythm.    Medications:      Scheduled Medications:  apixaban   2.5 mg Oral BID   atorvastatin   80 mg Oral QHS   Chlorhexidine  Gluconate Cloth  6 each Topical Daily   dapagliflozin  propanediol  10 mg Oral Daily   digoxin   0.0625 mg Oral Daily   furosemide   80 mg Intravenous Once   insulin  aspart  0-5 Units Subcutaneous QHS   insulin  aspart  0-9 Units Subcutaneous TID WC   ipratropium-albuterol   3 mL Nebulization QID   ivabradine   7.5 mg Oral BID WC   megestrol   20 mg Oral BID   methylPREDNISolone  (SOLU-MEDROL ) injection  40 mg Intravenous Q12H   sodium chloride  flush  10-40 mL Intracatheter Q12H   sodium chloride  flush  3 mL Intravenous Q12H   spironolactone   25 mg Oral Daily   torsemide   100 mg Oral BID    Infusions:  DOBUTamine  2.5 mcg/kg/min (07/18/24 0700)    PRN Medications: acetaminophen  **OR** acetaminophen , albuterol , hydrOXYzine , sodium chloride  flush   Assessment/Plan   1. Acute on Chronic Systolic Heart Failure - HF felt to be end-stage. EF has been 20-25% for the last 5 years.  - S/p Med[employer] ICD and Barostim - Last Echo 4/25: EF < 20%, RV normal  - Numerous hospitalizations over past 18 months  - Now readmitted w/ NYHA IIIb symptoms and volume overload. Pt attributes a/c dyspnea to anxiety/panic attack but she has clear volume overload on exam. Recent device interrogation 8/4 also demonstrating ongoing fluid accumulation since 7/22. Admit BNP 1300  - Lactate elevated, started on dobutamine  2.5 and placed PICC.  Co-ox 81% today.  Continue dobutamine  today, wean off over weekend.  - Good diuresis yesterday, weight down. Breathing better. CVP 8-9 today.  Will give Lasix  80 mg IV x 1 today then start back on home torsemide  100 mg bid this evening. Creatinine higher at 1.43, follow closely.  - Continue spironolactone  25 mg daily.  - Continue Farxiga  10 mg daily  - Continue ivabradine  7.5 mg bid - Continue reduced dose digoxin  0.0625 mg daily  - She is not a candidate for advanced therapies.  Poor long-term prognosis.  GOC conversation would be appropriate. Consult palliative care.    2. Hypokalemia - Resolved. - continue Spiro 25 mg daily    3. A/c Hypoxic Respiratory Failure - Has severe COPD.  - Suspect respiratory symptoms this admission combination of CHF and AECOPD.  She was wheezing severely initially.  - Improved with diuresis, steroids and nebs. Continue steroids with taper per IM.  3. CKD Stage IIIb  - Creatinine baseline ~ 1.4-1.7 - SCr 1.2 on admit>>1.43 today  - Continue SGLT2i - monitor w/ diuresis    4. PAF  - She remains in NSR  - Continue Eliquis  2.5 mg bid. Reduced dose (weight/creatinine).   5. CAD - History of CABG x 2 2019 - s/p DES ostial ramus extending to left main (2020) - denies CP  - Continue atorvastatin  80 mg daily - No ASA with need for AC   6. H/o SCLC - Completed treatment 2015.  - CT chest (5/24) soft tissue thickening along mediastinum but no discrete mass    7. H/o Renal Infarct 2022 - On Eliquis . No bleeding issues.  8. ID - Afebrile, WBCs normal.  Not on abx.     Length of Stay: 2  Ezra Shuck, MD  07/18/2024, 8:22 AM  Advanced Heart Failure Team Pager [phone] (M-F; 7a - 5p)  Please contact [facility] for night-coverage after hours (5p -7a ) and weekends on [email or web address]

## 2024-07-18 NOTE — Plan of Care (Signed)

## 2024-07-18 NOTE — Progress Notes (Signed)
 Patient declined PT d/t just receiving Lasix  and having high UOP. Was agreeable to working with me as long as it was in the room for now. Patient ambulated with minimal assistance from bed to chair. Was able to perform self care and stand at bedside without support. Will continue to work with patient throughout the day and tomorrow as tolerated.

## 2024-07-18 NOTE — Progress Notes (Signed)
 [facility]                                                                               Brittany [name] , is a 63 y.o. female, DOB - 12-May-1961, FMW:[id number] Admit date - 07/15/2024    Outpatient Primary MD for the patient is Tanda Bleacher, MD  LOS - 2  days    Brief summary  Brittany [name]  is a 63 y.o. female with medical history significant of hypertension, end-stage cardiomyopathy, chronic systolic CHF, s/p ICD, CAD s/p CABG, PAF, severe COPD, history of small cell lung cancer, protein calorie malnutrition, prior tobacco abuse presented with complaints of difficulty breathing. She was admitted for acute on chronic respiratory failure with hypoxia.      Assessment & Plan    Assessment and Plan:  Acute on chronic respiratory failure with hypoxia sec to acute on chronic combined systolic and diastolic  chf and copd exacerbation.  S/p ICD.  Initially required BIPAP and transitioned to [place] oxygen HHF to keep sats greater than 90% HF team onboard and appreciate input.  Last echo from 03/2024 showed LVEF less than 20%.  Lactic acid elevated at 2.1.will need CVP monitoring to guide diuresis.  She was started on dobutamine  gtt on 8/7, was given IV lasix  80 one dose today , and plan to transition to oral torsemide  100 mg BID along  with spironolactone  25 mg daily.  She had urine output of 2075 ml yesterday. She reports feeling better.  Continue with farxiga , digoxin  0.065 mg daily, Ivabradine  7.5 mg BID,.     Acute copd exacerbation:  Improving, continue with  IV steroids for another 24 hours and transition to oral prednisone  in am . Continue with duonebs.    Hypokalemia Replaced. Repeat potassium level wnl.   Stage 3b CKD Creatinine slightly increased from 0.75 to 1.35 to 1.43.    Hypertension BP parameters continue to be soft.    Protein calorie malnutrition Nutrition consulted.    PAF Rate controlled.  On Eliquis  for anti  coagulation.     Estimated body mass index is 17.9 kg/m as calculated from the following:   Height as of this encounter: 4' 11 (1.499 m).   Weight as of this encounter: 40.2 kg.  Code Status: DNR limited.  DVT Prophylaxis:  apixaban  (ELIQUIS ) tablet 2.5 mg Start: 07/16/24 1200 apixaban  (ELIQUIS ) tablet 2.5 mg   Level of Care: Level of care: Progressive Family Communication: none at bedside.   Disposition Plan:     Remains inpatient appropriate:  for IV diuresis  Procedures:  NONE.   Consultants:   HF team.   Antimicrobials:   Anti-infectives (From admission, onward)    None        Medications  Scheduled Meds:  apixaban   2.5 mg Oral BID   atorvastatin   80 mg Oral QHS   Chlorhexidine  Gluconate Cloth  6 each Topical Daily   dapagliflozin  propanediol  10 mg Oral Daily   digoxin   0.0625 mg Oral Daily   insulin  aspart  0-5 Units Subcutaneous QHS   insulin  aspart  0-9 Units Subcutaneous TID WC   ipratropium-albuterol   3 mL Nebulization QID   ivabradine   7.5  mg Oral BID WC   megestrol   20 mg Oral BID   methylPREDNISolone  (SOLU-MEDROL ) injection  40 mg Intravenous Q12H   sodium chloride  flush  10-40 mL Intracatheter Q12H   sodium chloride  flush  3 mL Intravenous Q12H   spironolactone   25 mg Oral Daily   Continuous Infusions:  DOBUTamine  2.5 mcg/kg/min (07/18/24 0700)   PRN Meds:.acetaminophen  **OR** acetaminophen , albuterol , hydrOXYzine , sodium chloride  flush    Subjective:   Brittany [name]  was seen and examined today.  No chest pain, breathing has improved.   Objective:   Vitals:   07/18/24 0435 07/18/24 0500 07/18/24 0600 07/18/24 0700  BP:  119/64 (!) 108/53 (!) 99/49  Pulse: 93 95 100 (!) 103  Resp: 16 16 16 15   Temp:      TempSrc:      SpO2: 100% 100% 100% 100%  Weight: 40.2 kg     Height:        Intake/Output Summary (Last 24 hours) at 07/18/2024 0729 Last data filed at 07/18/2024 0700 Gross per 24 hour  Intake 927.13 ml  Output 2075  ml  Net -1147.87 ml   Filed Weights   07/16/24 1300 07/17/24 0500 07/18/24 0435  Weight: 45.8 kg 45.8 kg 40.2 kg     General exam: ill appearing lady , not in distress.  Respiratory system: diminished air entry at bases.  Cardiovascular system: S1 & S2 heard, RRR.  Gastrointestinal system: Abdomen is nondistended, soft and nontender.  Central nervous system: Alert and oriented.  Extremities: Symmetric 5 x 5 power. Skin: No rashes,  Psychiatry: Mood & affect appropriate.     Data Reviewed:  I have personally reviewed following labs and imaging studies   CBC Lab Results  Component Value Date   WBC 8.0 07/18/2024   RBC 4.44 07/18/2024   HGB 12.5 07/18/2024   HCT 39.4 07/18/2024   MCV 88.7 07/18/2024   MCH 28.2 07/18/2024   PLT 167 07/18/2024   MCHC 31.7 07/18/2024   RDW 17.2 (H) 07/18/2024   LYMPHSABS 2.1 04/01/2024   MONOABS 1.1 (H) 04/01/2024   EOSABS 0.2 04/01/2024   BASOSABS 0.1 04/01/2024     Last metabolic panel Lab Results  Component Value Date   NA 143 07/18/2024   K 4.3 07/18/2024   CL 102 07/18/2024   CO2 29 07/18/2024   BUN 49 (H) 07/18/2024   CREATININE 1.43 (H) 07/18/2024   GLUCOSE 180 (H) 07/18/2024   GFRNONAA 41 (L) 07/18/2024   GFRAA >60 05/24/2020   CALCIUM  10.0 07/18/2024   PHOS 3.0 02/19/2024   PROT 3.9 (L) 07/15/2024   ALBUMIN  2.0 (L) 07/15/2024   LABGLOB 2.5 07/19/2021   AGRATIO 1.9 07/19/2021   BILITOT 0.7 07/15/2024   ALKPHOS 44 07/15/2024   AST 21 07/15/2024   ALT 11 07/15/2024   ANIONGAP 12 07/18/2024    CBG (last 3)  Recent Labs    07/17/24 2110 07/17/24 2131 07/18/24 0606  GLUCAP 185* 175* 230*      Coagulation Profile: No results for input(s): INR, PROTIME in the last 168 hours.   Radiology Studies: US  EKG SITE RITE Result Date: 07/17/2024 If Site Rite image not attached, placement could not be confirmed due to current cardiac rhythm.      Elgie Butter M.D. [facility] 07/18/2024, 7:29  AM  Available via Epic secure chat 7am-7pm After 7 pm, please refer to night coverage provider listed on amion.

## 2024-07-18 NOTE — TOC Initial Note (Addendum)
 Transition of Care [facility]) - Initial/Assessment Note    Patient Details  Name: Brittany [name]  MRN: [id number] Date of Birth: 11/04/1961  [facility] CM/SW Contact:    Justina Delcia Czar, RN Phone Number: [phone] 07/18/2024, 6:08 PM  Clinical Narrative:                  Spoke to patient and states she does not want HH or Palliative Services. Pt has DME at hotel. Sister assist her at home. She hires transportation and pays out of pocket. States she does not like Medicaid transport due to unclean vans.   Pt states she needs neb machine for home. Updated attending.   Contacted [employer] rep, Jermaine for neb machine for home.    Expected Discharge Plan: Home/Self Care Barriers to Discharge: Continued Medical Work up   Patient Goals and CMS Choice Patient states their goals for this hospitalization and ongoing recovery are:: wants to remain independent CMS Medicare.gov Compare Post Acute Care list provided to:: Patient Choice offered to / list presented to : Patient      Expected Discharge Plan and Services   Discharge Planning Services: CM Consult Post Acute Care Choice: Home Health Living arrangements for the past 2 months: Hotel/Motel                           HH Arranged: Patient Refused HH          Prior Living Arrangements/Services Living arrangements for the past 2 months: Hotel/Motel Lives with:: Self Patient language and need for interpreter reviewed:: Yes Do you feel safe going back to the place where you live?: Yes      Need for Family Participation in Patient Care: Yes (Comment) Care giver support system in place?: Yes (comment) Current home services: DME (rollator, oxygen, scale, Bipap) Criminal Activity/Legal Involvement Pertinent to Current Situation/Hospitalization: No - Comment as needed  Activities of Daily Living   ADL Screening (condition at time of admission) Independently performs ADLs?: Yes (appropriate for  developmental age) Is the patient deaf or have difficulty hearing?: No Does the patient have difficulty seeing, even when wearing glasses/contacts?: No Does the patient have difficulty concentrating, remembering, or making decisions?: No  Permission Sought/Granted Permission sought to share information with : Case Manager, Family Supports, PCP Permission granted to share information with : Yes, Verbal Permission Granted  Share Information with NAME: [name] Pinal  Permission granted to share info w AGENCY: Home Health, Outpatient Pallaitive, DME, PCP  Permission granted to share info w Relationship: sister  Permission granted to share info w Contact Information: [phone]  Emotional Assessment Appearance:: Appears stated age Attitude/Demeanor/Rapport: Engaged Affect (typically observed): Accepting Orientation: : Oriented to Self, Oriented to Place, Oriented to  Time, Oriented to Situation   Psych Involvement: No (comment)  Admission diagnosis:  Hypocalcemia [E83.51] Hypokalemia [E87.6] Acute respiratory failure with hypoxia (HCC) [J96.01] Patient Active Problem List   Diagnosis Date Noted   Acute respiratory failure with hypoxia (HCC) 07/16/2024   Hyponatremia 07/16/2024   Acute respiratory failure (HCC) 04/01/2024   History of CAD (coronary artery disease) 12/15/2023   Hx of hypokalemia 12/15/2023   Respiratory distress 12/15/2023   Chronic respiratory failure with hypoxia and hypercapnia (HCC) 10/11/2023   Acute on chronic systolic CHF (congestive heart failure) (HCC) 09/25/2023   Acute kidney injury superimposed on chronic kidney disease (HCC) 08/04/2023   Acute on chronic respiratory failure (HCC) 08/03/2023   Trichomonas infection 04/19/2023  Presence of heart assist device (HCC) 04/11/2023   COPD with acute exacerbation (HCC) 03/01/2023   CAD (coronary artery disease) 03/01/2023   Acute hypoxic respiratory failure (HCC) 11/14/2022   Sepsis (HCC) 11/14/2022    Influenza A with pneumonia 11/14/2022   Troponin level elevated 11/14/2022   History of lung cancer 11/14/2022   Acute combined systolic and diastolic CHF, NYHA class 4 (HCC) 07/09/2022   Malnutrition of moderate degree 06/20/2022   Lactic acidosis 04/12/2022   Prolonged QT interval 04/09/2022   Hypotension 04/09/2022   Chronic systolic heart failure (HCC) 12/11/2021   CHF exacerbation (HCC) 12/10/2021   Renal infarct (HCC) 12/10/2021   COPD exacerbation (HCC) 09/29/2021   Mixed diabetic hyperlipidemia associated with type 2 diabetes mellitus (HCC) 09/29/2021   CKD stage 3b, GFR 30-44 ml/min (HCC) 09/29/2021   Unstable angina (HCC) 02/09/2021   Hemoptysis 10/20/2020   Type 2 diabetes mellitus with hyperlipidemia (HCC) 10/20/2020   Acute on chronic combined systolic and diastolic CHF (congestive heart failure) (HCC) 05/21/2020   Cardiomyopathy, ischemic 04/12/2020   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   NSTEMI (non-ST elevated myocardial infarction) (HCC) 02/12/2019   CHF (congestive heart failure) (HCC) 11/24/2018   Post-operative pain 09/10/2018   COPD (chronic obstructive pulmonary disease) (HCC) 09/04/2018   Recurrent pleural effusion on right 05/05/2018   Acute on chronic respiratory failure with hypoxia (HCC) 05/05/2018   Chronic respiratory failure with hypoxia (HCC)    Asthma    Anxiety    Allergy    HCAP (healthcare-associated pneumonia) 03/15/2015   Chest pain 03/15/2015   Small cell lung cancer (HCC) 03/16/2014   Protein-calorie malnutrition, severe (HCC) 03/14/2014   PAF (paroxysmal atrial fibrillation) (HCC) 03/12/2014   Essential hypertension 05/07/2012   PCP:  Tanda Bleacher, MD Pharmacy:   [id number] - [place], [place] - [address] [place] [address] Phone: [phone] Fax: [phone]  [place] [facility] [address] [place] [address] Phone: [phone]  Fax: [phone]     Social Drivers of Health (SDOH) Social History: SDOH Screenings   Food Insecurity: No Food Insecurity (07/16/2024)  Housing: Low Risk  (07/16/2024)  Transportation Needs: No Transportation Needs (07/16/2024)  Utilities: Not At Risk (07/16/2024)  Alcohol Screen: Low Risk  (02/11/2021)  Depression (PHQ2-9): Low Risk  (02/26/2024)  Financial Resource Strain: Medium Risk (02/11/2021)  Physical Activity: Insufficiently Active (02/11/2021)  Social Connections: Unknown (02/11/2021)  Stress: Stress Concern Present (02/11/2021)  Tobacco Use: Medium Risk (07/16/2024)   SDOH Interventions:     Readmission Risk Interventions    02/18/2024    3:51 PM 06/20/2022   10:00 AM  Readmission Risk Prevention Plan  Transportation Screening Complete Complete  PCP or Specialist Appt within 3-5 Days  Complete  HRI or Home Care Consult  Complete  Social Work Consult for Recovery Care Planning/Counseling  Complete  Palliative Care Screening  Not Applicable  Medication Review Oceanographer) Complete Complete  PCP or Specialist appointment within 3-5 days of discharge Complete   HRI or Home Care Consult Complete   Palliative Care Screening Not Applicable   Skilled Nursing Facility Not Applicable

## 2024-07-18 NOTE — Progress Notes (Signed)
 PT Cancellation Note  Patient Details Name: Brittany [name]  MRN: [id number] DOB: Nov 04, 1961   Cancelled Treatment:    Reason Eval/Treat Not Completed: Other (Pt reports that she would like to stay in bed at this time due to she is concerned about frequent urination secondary to IV lasix . Pt reports she would like to get up to the chair with nursing and walk with nursing. Nursing (Amy Sink RN) states that she will be able to mobilize pt today and tomorrow. Will sign off at this time. Please re-consult if further needs arise. )  Dorothyann Maier, DPT, CLT  Acute Rehabilitation Services Office: [phone] (Secure chat preferred)    Dorothyann VEAR Maier 07/18/2024, 10:36 AM

## 2024-07-19 DIAGNOSIS — N1832 Chronic kidney disease, stage 3b: Secondary | ICD-10-CM | POA: Diagnosis not present

## 2024-07-19 DIAGNOSIS — I480 Paroxysmal atrial fibrillation: Secondary | ICD-10-CM | POA: Diagnosis not present

## 2024-07-19 DIAGNOSIS — I5041 Acute combined systolic (congestive) and diastolic (congestive) heart failure: Secondary | ICD-10-CM | POA: Diagnosis not present

## 2024-07-19 DIAGNOSIS — Z95810 Presence of automatic (implantable) cardiac defibrillator: Secondary | ICD-10-CM | POA: Diagnosis not present

## 2024-07-19 DIAGNOSIS — I5023 Acute on chronic systolic (congestive) heart failure: Secondary | ICD-10-CM | POA: Diagnosis not present

## 2024-07-19 DIAGNOSIS — J9601 Acute respiratory failure with hypoxia: Secondary | ICD-10-CM | POA: Diagnosis not present

## 2024-07-19 LAB — BASIC METABOLIC PANEL WITH GFR
Anion gap: 14 (ref 5–15)
BUN: 62 mg/dL — ABNORMAL HIGH (ref 8–23)
CO2: 28 mmol/L (ref 22–32)
Calcium: 9.6 mg/dL (ref 8.9–10.3)
Chloride: 93 mmol/L — ABNORMAL LOW (ref 98–111)
Creatinine, Ser: 1.57 mg/dL — ABNORMAL HIGH (ref 0.44–1.00)
GFR, Estimated: 37 mL/min — ABNORMAL LOW (ref >60)
Glucose, Bld: 219 mg/dL — ABNORMAL HIGH (ref 70–99)
Potassium: 3.6 mmol/L (ref 3.5–5.1)
Sodium: 135 mmol/L (ref 135–145)

## 2024-07-19 LAB — COOXEMETRY PANEL
Carboxyhemoglobin: 2 % — ABNORMAL HIGH (ref 0.5–1.5)
Methemoglobin: <0.7 % (ref 0.0–1.5)
O2 Saturation: 73.4 %
Total hemoglobin: 14.2 g/dL (ref 12.0–16.0)

## 2024-07-19 LAB — GLUCOSE, CAPILLARY
Glucose-Capillary: 218 mg/dL — ABNORMAL HIGH (ref 70–99)
Glucose-Capillary: 249 mg/dL — ABNORMAL HIGH (ref 70–99)
Glucose-Capillary: 284 mg/dL — ABNORMAL HIGH (ref 70–99)
Glucose-Capillary: 269 mg/dL — ABNORMAL HIGH (ref 70–99)

## 2024-07-19 MED ORDER — POTASSIUM CHLORIDE CRYS ER 20 MEQ PO TBCR
40.0000 meq | EXTENDED_RELEASE_TABLET | Freq: Once | ORAL | Status: DC
  Filled 2024-07-19: qty 2

## 2024-07-19 MED ORDER — LEVALBUTEROL HCL 1.25 MG/0.5ML IN NEBU
1.2500 mg | INHALATION_SOLUTION | Freq: Four times a day (QID) | RESPIRATORY_TRACT | Status: DC
  Administered 2024-07-19 – 2024-07-20 (×3): 1.25 mg via RESPIRATORY_TRACT
  Filled 2024-07-19 (×5): qty 0.5

## 2024-07-19 MED ORDER — PANTOPRAZOLE SODIUM 40 MG PO TBEC
40.0000 mg | DELAYED_RELEASE_TABLET | Freq: Every day | ORAL | Status: DC
  Administered 2024-07-20: 40 mg via ORAL
  Filled 2024-07-19: qty 1

## 2024-07-19 MED ORDER — POTASSIUM CHLORIDE 20 MEQ PO PACK
40.0000 meq | PACK | Freq: Once | ORAL | Status: AC
  Administered 2024-07-19: 40 meq via ORAL
  Filled 2024-07-19: qty 2

## 2024-07-19 MED ORDER — PREDNISONE 20 MG PO TABS
40.0000 mg | ORAL_TABLET | Freq: Every day | ORAL | Status: DC
  Administered 2024-07-20: 40 mg via ORAL
  Filled 2024-07-19: qty 2

## 2024-07-19 MED ORDER — GUAIFENESIN-DM 100-10 MG/5ML PO SYRP
5.0000 mL | ORAL_SOLUTION | ORAL | Status: DC | PRN
  Administered 2024-07-19: 5 mL via ORAL
  Filled 2024-07-19: qty 5

## 2024-07-19 NOTE — Progress Notes (Signed)
 Patient ID: Brittany [name] , female   DOB: August 14, 1961, 63 y.o.   MRN: [id number]     Advanced Heart Failure Rounding Note  Cardiologist: Dr. Cherrie  Chief Complaint: acute on chronic hypoxic respiratory failure 2/2 CHF and COPDE    Patient Profile   Brittany [name]  is a 63 y.o. 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  Subjective:    Feeling well, currently not wearing oxygen. CVP 2-3 while in the room, mild bump in creatinine again this morning.    Objective:   Weight Range: 38.1 kg Body mass index is 16.97 kg/m.   Vital Signs:   Temp:  [97.7 F (36.5 C)-98.3 F (36.8 C)] 98.3 F (36.8 C) (08/09 0820) Pulse Rate:  [94-103] 101 (08/09 0900) Resp:  [16-28] 28 (08/09 0900) BP: (90-111)/(50-76) 111/69 (08/09 0820) SpO2:  [99 %-100 %] 100 % (08/09 0900) Weight:  [38.1 kg] 38.1 kg (08/09 0500) Last BM Date : 07/16/24  Weight change: Filed Weights   07/17/24 0500 07/18/24 0435 07/19/24 0500  Weight: 45.8 kg 40.2 kg 38.1 kg    Intake/Output:   Intake/Output Summary (Last 24 hours) at 07/19/2024 1040 Last data filed at 07/19/2024 1000 Gross per 24 hour  Intake 960 ml  Output 1400 ml  Net -440 ml      Physical Exam    GENERAL: NAD, chronically ill appearing, frail PULM:  Normal WOB CARDIAC:  JVP: flat         Tachycardic rate with regular rhythm. No murmurs, rubs or gallops.  No edema. Warm and well perfused extremities. ABDOMEN: Soft, non-tender, non-distended. NEUROLOGIC: Patient is oriented x3 with no focal or lateralizing neurologic deficits.    Telemetry   NSR 90-100s, + barostim artifact (personally reviewed)    Labs    CBC Recent Labs     07/17/24 0337 07/18/24 0417  WBC 10.4 8.0  HGB 13.4 12.5  HCT 43.7 39.4  MCV 91.2 88.7  PLT 191 167   Basic Metabolic Panel Recent Labs    91/92/74 0337 07/17/24 1435 07/18/24 0417 07/19/24 0455  NA 138   < > 143 135  K 4.4   < > 4.3 3.6  CL 105   < > 102 93*  CO2 21*   < > 29 28  GLUCOSE 175*   < > 180* 219*  BUN 31*   < > 49* 62*  CREATININE 1.35*   < > 1.43* 1.57*  CALCIUM  9.5   < > 10.0 9.6  MG 2.5*  --   --   --    < > = values in this interval not displayed.   Liver Function Tests No results for input(s): AST, ALT, ALKPHOS, BILITOT, PROT, ALBUMIN  in the last 72 hours.  No results for input(s): LIPASE, AMYLASE in the last 72 hours. Cardiac Enzymes No results for input(s): CKTOTAL, CKMB, CKMBINDEX, TROPONINI in the last 72 hours.  BNP: BNP (last 3 results) Recent Labs    04/01/24 0625 06/09/24 1403 07/15/24 2131  BNP 4,049.6* 1,214.2* 1,303.1*   Medications:     Scheduled Medications:  apixaban   2.5 mg Oral BID   atorvastatin   80 mg Oral QHS   Chlorhexidine  Gluconate Cloth  6 each Topical Daily   dapagliflozin  propanediol  10 mg Oral Daily   digoxin   0.0625 mg Oral Daily   insulin  aspart  0-5 Units Subcutaneous QHS   insulin  aspart  0-9 Units Subcutaneous TID WC   ivabradine   7.5 mg Oral BID WC   levalbuterol   1.25 mg Nebulization Q6H   megestrol   20 mg Oral BID   methylPREDNISolone  (SOLU-MEDROL ) injection  40 mg Intravenous Q12H   sodium chloride  flush  10-40 mL Intracatheter Q12H   sodium chloride  flush  3 mL Intravenous Q12H   spironolactone   25 mg Oral Daily    Infusions:    PRN Medications: acetaminophen  **OR** acetaminophen , albuterol , hydrOXYzine , methocarbamol , sodium chloride  flush   Assessment/Plan   1. Acute on Chronic Systolic Heart Failure - HF felt to be end-stage. EF has been 20-25% for the last 5 years.  - S/p Medtronic ICD and Barostim - Last Echo 4/25: EF < 20%, RV normal  - Numerous  hospitalizations over past 18 months  - Now readmitted w/ NYHA IIIb symptoms and volume overload.   - Lactate elevated, started on dobutamine  2.5 and placed PICC.  Co-ox 73% today.  Stop dobutamine  today and check coox tomorrow - Mild bump in creatinine, BUN also rising, dry given CVP 2-3. Will hold oral diuretics today, can restart tomorrow (torsemide  100mg  BID)  - Continue spironolactone  25 mg daily.  - Continue Farxiga  10 mg daily  - Continue ivabradine  7.5 mg bid - Continue reduced dose digoxin  0.0625 mg daily  - She is not a candidate for advanced therapies. Poor long-term prognosis. Has talked with palliative in the past   2. Hypokalemia - Resolved. - continue Spiro 25 mg daily    3. A/c Hypoxic Respiratory Failure - Has severe COPD.  - Suspect respiratory symptoms this admission combination of CHF and AECOPD.  She was wheezing severely initially.  - Improved with diuresis, steroids and nebs. Continue steroids with taper per IM.    3. CKD Stage IIIb  - Creatinine baseline ~ 1.4-1.7 - SCr 1.2 on admit>>1.57 today with rise in BUN - Continue SGLT2i - monitor w/ diuresis    4. PAF  - She remains in NSR  - Continue Eliquis  2.5 mg bid. Reduced dose (weight/creatinine).   5. CAD - History of CABG x 2 2019 - s/p DES ostial ramus extending to left main (2020) - denies CP  - Continue atorvastatin  80 mg daily - No ASA with need for AC   6. H/o SCLC - Completed treatment 2015.  - CT chest (5/24) soft tissue thickening along mediastinum but no discrete mass    7. H/o Renal Infarct 2022 - On Eliquis . No bleeding issues.  8. ID - Afebrile, WBCs normal.  Not on abx.     Length of Stay: 3  Morene JINNY Brownie, MD  07/19/2024, 10:40 AM  Advanced Heart Failure Team Pager [phone] (M-F; 7a - 5p)  Please contact [facility] for night-coverage after hours (5p -7a ) and weekends on [email or web address]

## 2024-07-19 NOTE — Plan of Care (Signed)
   Problem: Education: Goal: Knowledge of General Education information will improve Description Including pain rating scale, medication(s)/side effects and non-pharmacologic comfort measures Outcome: Progressing

## 2024-07-19 NOTE — Progress Notes (Signed)
 [facility]                                                                               Brittany [name] , is a 63 y.o. female, DOB - Dec 02, 1961, FMW:[id number] Admit date - 07/15/2024    Outpatient Primary MD for the patient is Tanda Bleacher, MD  LOS - 3  days    Brief summary  Brittany [name]  is a 63 y.o. female with medical history significant of hypertension, end-stage cardiomyopathy, chronic systolic CHF, s/p ICD, CAD s/p CABG, PAF, severe COPD, history of small cell lung cancer, protein calorie malnutrition, prior tobacco abuse presented with complaints of difficulty breathing. She was admitted for acute on chronic respiratory failure with hypoxia.      Assessment & Plan    Assessment and Plan:  Acute on chronic respiratory failure with hypoxia sec to acute on chronic combined systolic and diastolic  chf and copd exacerbation.  S/p ICD.  Initially required BIPAP and transitioned to [place] oxygen HHF to keep sats greater than 90% HF team onboard and appreciate input.  Last echo from 03/2024 showed LVEF less than 20%.  Lactic acid elevated at 2.1.will need CVP monitoring to guide diuresis.  She was started on dobutamine  gtt on 8/7, to be stopped today. Restart torsemide  in am  with spironolactone  25 mg daily.  She had urine output of 1400 ml yesterday. She reports feeling better. Weaned off oxygen.  Continue with farxiga , digoxin  0.065 mg daily, Ivabradine  7.5 mg BID,.   Acute copd exacerbation:  No wheezing heard, transition to prednisone  taper.    Hypokalemia Replaced. Repeat potassium level wnl.   Acute on Stage 3b CKD Creatinine slightly increased from 0.75 to 1.35 to 1.43 to 1.5.    Hypertension Well controlled.   Protein calorie malnutrition Nutrition consulted.    PAF Rate controlled.  On Eliquis  for anti coagulation.     Estimated body mass index is 16.97 kg/m as calculated from the following:   Height as of this  encounter: 4' 11 (1.499 m).   Weight as of this encounter: 38.1 kg.  Code Status: DNR limited.  DVT Prophylaxis:  apixaban  (ELIQUIS ) tablet 2.5 mg Start: 07/16/24 1200 apixaban  (ELIQUIS ) tablet 2.5 mg   Level of Care: Level of care: Progressive Family Communication: none at bedside.   Disposition Plan:     Remains inpatient appropriate:  for IV diuresis  Procedures:  NONE.   Consultants:   HF team.   Antimicrobials:   Anti-infectives (From admission, onward)    None        Medications  Scheduled Meds:  apixaban   2.5 mg Oral BID   atorvastatin   80 mg Oral QHS   Chlorhexidine  Gluconate Cloth  6 each Topical Daily   dapagliflozin  propanediol  10 mg Oral Daily   digoxin   0.0625 mg Oral Daily   insulin  aspart  0-5 Units Subcutaneous QHS   insulin  aspart  0-9 Units Subcutaneous TID WC   ivabradine   7.5 mg Oral BID WC   levalbuterol   1.25 mg Nebulization Q6H   megestrol   20 mg Oral BID   methylPREDNISolone  (SOLU-MEDROL ) injection  40 mg Intravenous Q12H   potassium chloride   40 mEq Oral Once   sodium chloride  flush  10-40 mL Intracatheter Q12H   sodium chloride  flush  3 mL Intravenous Q12H   spironolactone   25 mg Oral Daily   Continuous Infusions:   PRN Meds:.acetaminophen  **OR** acetaminophen , albuterol , hydrOXYzine , methocarbamol , sodium chloride  flush    Subjective:   Brittany [name]  was seen and examined today.  Wants to know when she can go home.   Objective:   Vitals:   07/19/24 0820 07/19/24 0900 07/19/24 1202 07/19/24 1431  BP: 111/69  100/66   Pulse: (!) 103 (!) 101    Resp:  (!) 28    Temp: 98.3 F (36.8 C)  98.3 F (36.8 C)   TempSrc: Oral  Oral   SpO2: 100% 100%  100%  Weight:      Height:        Intake/Output Summary (Last 24 hours) at 07/19/2024 1439 Last data filed at 07/19/2024 1000 Gross per 24 hour  Intake 720 ml  Output 600 ml  Net 120 ml   Filed Weights   07/17/24 0500 07/18/24 0435 07/19/24 0500  Weight: 45.8 kg  40.2 kg 38.1 kg    General exam: Appears calm and comfortable  Respiratory system: Clear to auscultation. Respiratory effort normal. Cardiovascular system: S1 & S2 heard, RRR.  Gastrointestinal system: Abdomen is nondistended, soft and nontender. Central nervous system: Alert and oriented. Extremities: Symmetric 5 x 5 power. Skin: No rashes, Psychiatry:Mood & affect appropriate.      Data Reviewed:  I have personally reviewed following labs and imaging studies   CBC Lab Results  Component Value Date   WBC 8.0 07/18/2024   RBC 4.44 07/18/2024   HGB 12.5 07/18/2024   HCT 39.4 07/18/2024   MCV 88.7 07/18/2024   MCH 28.2 07/18/2024   PLT 167 07/18/2024   MCHC 31.7 07/18/2024   RDW 17.2 (H) 07/18/2024   LYMPHSABS 2.1 04/01/2024   MONOABS 1.1 (H) 04/01/2024   EOSABS 0.2 04/01/2024   BASOSABS 0.1 04/01/2024     Last metabolic panel Lab Results  Component Value Date   NA 135 07/19/2024   K 3.6 07/19/2024   CL 93 (L) 07/19/2024   CO2 28 07/19/2024   BUN 62 (H) 07/19/2024   CREATININE 1.57 (H) 07/19/2024   GLUCOSE 219 (H) 07/19/2024   GFRNONAA 37 (L) 07/19/2024   GFRAA >60 05/24/2020   CALCIUM  9.6 07/19/2024   PHOS 3.0 02/19/2024   PROT 3.9 (L) 07/15/2024   ALBUMIN  2.0 (L) 07/15/2024   LABGLOB 2.5 07/19/2021   AGRATIO 1.9 07/19/2021   BILITOT 0.7 07/15/2024   ALKPHOS 44 07/15/2024   AST 21 07/15/2024   ALT 11 07/15/2024   ANIONGAP 14 07/19/2024    CBG (last 3)  Recent Labs    07/18/24 2125 07/19/24 0620 07/19/24 1226  GLUCAP 176* 218* 249*      Coagulation Profile: No results for input(s): INR, PROTIME in the last 168 hours.   Radiology Studies: No results found.      Elgie Butter M.D. [facility] 07/19/2024, 2:39 PM  Available via Epic secure chat 7am-7pm After 7 pm, please refer to night coverage provider listed on amion.

## 2024-07-19 NOTE — Plan of Care (Signed)

## 2024-07-20 DIAGNOSIS — I5023 Acute on chronic systolic (congestive) heart failure: Secondary | ICD-10-CM | POA: Diagnosis not present

## 2024-07-20 DIAGNOSIS — E876 Hypokalemia: Secondary | ICD-10-CM | POA: Diagnosis not present

## 2024-07-20 DIAGNOSIS — Z95810 Presence of automatic (implantable) cardiac defibrillator: Secondary | ICD-10-CM | POA: Diagnosis not present

## 2024-07-20 DIAGNOSIS — J441 Chronic obstructive pulmonary disease with (acute) exacerbation: Secondary | ICD-10-CM | POA: Diagnosis not present

## 2024-07-20 DIAGNOSIS — I5041 Acute combined systolic (congestive) and diastolic (congestive) heart failure: Secondary | ICD-10-CM | POA: Diagnosis not present

## 2024-07-20 DIAGNOSIS — I480 Paroxysmal atrial fibrillation: Secondary | ICD-10-CM | POA: Diagnosis not present

## 2024-07-20 DIAGNOSIS — J9601 Acute respiratory failure with hypoxia: Secondary | ICD-10-CM | POA: Diagnosis not present

## 2024-07-20 LAB — GLUCOSE, CAPILLARY
Glucose-Capillary: 177 mg/dL — ABNORMAL HIGH (ref 70–99)
Glucose-Capillary: 175 mg/dL — ABNORMAL HIGH (ref 70–99)

## 2024-07-20 LAB — BASIC METABOLIC PANEL WITH GFR
Anion gap: 14 (ref 5–15)
BUN: 71 mg/dL — ABNORMAL HIGH (ref 8–23)
CO2: 29 mmol/L (ref 22–32)
Calcium: 9.5 mg/dL (ref 8.9–10.3)
Chloride: 96 mmol/L — ABNORMAL LOW (ref 98–111)
Creatinine, Ser: 1.53 mg/dL — ABNORMAL HIGH (ref 0.44–1.00)
GFR, Estimated: 38 mL/min — ABNORMAL LOW (ref >60)
Glucose, Bld: 120 mg/dL — ABNORMAL HIGH (ref 70–99)
Potassium: 3.2 mmol/L — ABNORMAL LOW (ref 3.5–5.1)
Sodium: 139 mmol/L (ref 135–145)

## 2024-07-20 LAB — COOXEMETRY PANEL
Carboxyhemoglobin: 1.6 % — ABNORMAL HIGH (ref 0.5–1.5)
Methemoglobin: <0.7 % (ref 0.0–1.5)
O2 Saturation: 70.5 %
Total hemoglobin: 16 g/dL (ref 12.0–16.0)

## 2024-07-20 MED ORDER — POTASSIUM CHLORIDE 20 MEQ PO PACK
40.0000 meq | PACK | Freq: Two times a day (BID) | ORAL | Status: DC
  Administered 2024-07-20: 40 meq via ORAL
  Filled 2024-07-20: qty 2

## 2024-07-20 MED ORDER — SPIRONOLACTONE 25 MG PO TABS: 25.0000 mg | ORAL_TABLET | Freq: Every day | ORAL | 2 refills | Status: DC

## 2024-07-20 MED ORDER — ARFORMOTEROL TARTRATE 15 MCG/2ML IN NEBU: 15.0000 ug | INHALATION_SOLUTION | Freq: Two times a day (BID) | RESPIRATORY_TRACT | Status: DC

## 2024-07-20 MED ORDER — BUDESONIDE 0.25 MG/2ML IN SUSP: 0.2500 mg | Freq: Two times a day (BID) | RESPIRATORY_TRACT | Status: DC

## 2024-07-20 MED ORDER — PANTOPRAZOLE SODIUM 40 MG PO TBEC: 40.0000 mg | DELAYED_RELEASE_TABLET | Freq: Every day | ORAL | 0 refills | Status: AC

## 2024-07-20 MED ORDER — PREDNISONE 20 MG PO TABS: 40.0000 mg | ORAL_TABLET | Freq: Every day | ORAL | 0 refills | Status: AC

## 2024-07-20 MED ORDER — LEVALBUTEROL HCL 1.25 MG/0.5ML IN NEBU: 1.2500 mg | INHALATION_SOLUTION | Freq: Four times a day (QID) | RESPIRATORY_TRACT | Status: DC | PRN

## 2024-07-20 MED ORDER — TORSEMIDE 20 MG PO TABS: 100.0000 mg | ORAL_TABLET | Freq: Two times a day (BID) | ORAL | Status: DC

## 2024-07-20 MED ORDER — REVEFENACIN 175 MCG/3ML IN SOLN: 175.0000 ug | Freq: Every day | RESPIRATORY_TRACT | Status: DC

## 2024-07-20 MED ORDER — DIGOXIN 62.5 MCG PO TABS: 0.0625 mg | ORAL_TABLET | Freq: Every day | ORAL | 1 refills | Status: DC

## 2024-07-20 NOTE — Progress Notes (Signed)
 Pt discharged home. Pt belongings given. Pt's PICC taken out. AVS printed and given. Education provided.Pt verbalized understanding. Pt denies pain. Pt taken downstairs.

## 2024-07-20 NOTE — Plan of Care (Signed)

## 2024-07-20 NOTE — Progress Notes (Signed)
 DISCHARGE NOTE HOME Brittany [name]  to be discharged Home per MD order. Discussed prescriptions and follow up appointments with the patient. Prescriptions given to patient; medication list explained in detail. Patient verbalized understanding.  Skin clean, dry and intact without evidence of skin break down, no evidence of skin tears noted. IV catheter discontinued intact. Site without signs and symptoms of complications. Dressing and pressure applied. Pt denies pain at the site currently. No complaints noted.  Patient free of lines, drains, and wounds.   An After Visit Summary (AVS) was printed and given to the patient. Patient escorted via wheelchair, and discharged home via private auto.  [name], RN

## 2024-07-20 NOTE — Progress Notes (Signed)
 Nebulizer machine to be delivered to patient's home as patient has not received in the hospital and doesn't want to wait.  Jermaine from [employer] states will be delivered to the home.

## 2024-07-20 NOTE — Plan of Care (Signed)

## 2024-07-20 NOTE — Progress Notes (Signed)
 Patient ID: Brittany [name] , female   DOB: Apr 04, 1961, 63 y.o.   MRN: [id number]     Advanced Heart Failure Rounding Note  Cardiologist: Dr. Cherrie  Chief Complaint: acute on chronic hypoxic respiratory failure 2/2 CHF and COPDE    Patient Profile   Brittany [name]  is a 63 y.o. 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  Subjective:    Diuretics held yesterday for mild AKI and overdiuresis.    DBA stopped yesterday. Co-ox 71% [place] 1.57 -> 1.53  Feels good. Anxious to go home. No CP or SOB    Objective:   Weight Range: 36.3 kg Body mass index is 16.16 kg/m.   Vital Signs:   Temp:  [98 F (36.7 C)-98.5 F (36.9 C)] 98 F (36.7 C) (08/10 1048) Pulse Rate:  [88-103] 100 (08/10 1048) Resp:  [16-18] 18 (08/10 1048) BP: (95-114)/(64-69) 103/69 (08/10 1048) SpO2:  [98 %-100 %] 100 % (08/10 1048) Weight:  [36.3 kg] 36.3 kg (08/10 0500) Last BM Date : 07/16/24  Weight change: Filed Weights   07/18/24 0435 07/19/24 0500 07/20/24 0500  Weight: 40.2 kg 38.1 kg 36.3 kg    Intake/Output:   Intake/Output Summary (Last 24 hours) at 07/20/2024 1130 Last data filed at 07/20/2024 0816 Gross per 24 hour  Intake 240 ml  Output 1000 ml  Net -760 ml      Physical Exam    General: NAD, chronically ill appearing, frail HEENT: normal Neck: supple. no JVD. Carotids 2+ bilat; no bruits. No lymphadenopathy or thryomegaly appreciated. Cor: PMI nondisplaced. Regular rate & rhythm. No rubs, gallops or murmurs. Lungs: decreased throughout Abdomen: soft, nontender, nondistended. No hepatosplenomegaly. No bruits or masses. Good bowel sounds. Extremities: no cyanosis, clubbing, rash,  edema Neuro: alert & orientedx3, cranial nerves grossly intact. moves all 4 extremities w/o difficulty. Affect pleasant   Telemetry   NSR 90-100s, + barostim artifact (personally reviewed)   Labs    CBC Recent Labs    07/18/24 0417  WBC 8.0  HGB 12.5  HCT 39.4  MCV 88.7  PLT 167   Basic Metabolic Panel Recent Labs    91/90/74 0455 07/20/24 0539  NA 135 139  K 3.6 3.2*  CL 93* 96*  CO2 28 29  GLUCOSE 219* 120*  BUN 62* 71*  CREATININE 1.57* 1.53*  CALCIUM  9.6 9.5   Liver Function Tests No results for input(s): AST, ALT, ALKPHOS, BILITOT, PROT, ALBUMIN  in the last 72 hours.  No results for input(s): LIPASE, AMYLASE in the last 72 hours. Cardiac Enzymes No results for input(s): CKTOTAL, CKMB, CKMBINDEX, TROPONINI in the last 72 hours.  BNP: BNP (last 3 results) Recent Labs    04/01/24 0625 06/09/24 1403 07/15/24 2131  BNP 4,049.6* 1,214.2* 1,303.1*   Medications:     Scheduled Medications:  apixaban   2.5 mg Oral BID   arformoterol   15 mcg Nebulization BID   atorvastatin   80 mg Oral QHS   budesonide  (PULMICORT ) nebulizer solution  0.25 mg Nebulization BID   Chlorhexidine  Gluconate Cloth  6 each Topical Daily   dapagliflozin  propanediol  10 mg Oral Daily   digoxin   0.0625 mg Oral Daily   insulin  aspart  0-5 Units Subcutaneous  QHS   insulin  aspart  0-9 Units Subcutaneous TID WC   ivabradine   7.5 mg Oral BID WC   megestrol   20 mg Oral BID   pantoprazole   40 mg Oral Q0600   potassium chloride   40 mEq Oral BID   predniSONE   40 mg Oral QAC breakfast   [START ON 07/21/2024] revefenacin   175 mcg Nebulization Daily   sodium chloride  flush  10-40 mL Intracatheter Q12H   sodium chloride  flush  3 mL Intravenous Q12H   spironolactone   25 mg Oral Daily    Infusions:    PRN Medications: acetaminophen  **OR** acetaminophen , albuterol , guaiFENesin -dextromethorphan , hydrOXYzine , levalbuterol , methocarbamol , sodium chloride   flush   Assessment/Plan   1. Acute on Chronic Systolic Heart Failure - HF felt to be end-stage. EF has been 20-25% for the last 5 years.  - S/p Medtronic ICD and Barostim - Last Echo 4/25: EF < 20%, RV normal  - Numerous hospitalizations over past 18 months  - Now readmitted w/ NYHA IIIb symptoms and volume overload.   - Lactate elevated, started on dobutamine  2.5 and placed PICC.  DBA stopped 8/9. Co-ox 71%  - Volume status much improved - Continue spironolactone  25 mg daily.  - Continue Farxiga  10 mg daily  - Continue ivabradine  7.5 mg bid - Continue reduced dose digoxin  0.0625 mg daily  - She is not a candidate for advanced therapies. Poor long-term prognosis. Has talked with palliative in the past - much improved today. Stable for d/c. Resume torsemide  100 bid tomorrow   2. Hypokalemia - K 3.2 - supped - continue Spiro 25 mg daily    3. A/c Hypoxic Respiratory Failure - Has severe COPD.  - Suspect respiratory symptoms this admission combination of CHF and AECOPD.  She was wheezing severely initially.  - Improved with diuresis, steroids and nebs. Continue steroids with taper per IM.    3. CKD Stage IIIb  - Creatinine baseline ~ 1.4-1.7 - SCr 1.2 on admit>>1.57 -> 1.53 - Continue SGLT2i - ok for d/c  4. PAF  - She remains in NSR  - Continue Eliquis  2.5 mg bid. Reduced dose (weight/creatinine). - no bleeding   5. CAD - History of CABG x 2 2019 - s/p DES ostial ramus extending to left main (2020) - No s/s angina - Continue atorvastatin  80 mg daily - No ASA with need for AC   6. H/o SCLC - Completed treatment 2015.  - CT chest (5/24) soft tissue thickening along mediastinum but no discrete mass    7. H/o Renal Infarct 2022 - On Eliquis . No bleeding issues.  8. ID - Afebrile, WBCs normal.  Not on abx.     Length of Stay: 4  Toribio Fuel, MD  07/20/2024, 11:30 AM  Advanced Heart Failure Team Pager [phone] (M-F; 7a - 5p)  Please contact [facility] for night-coverage after hours (5p -7a ) and weekends on [email or web address]

## 2024-07-21 ENCOUNTER — Ambulatory Visit: Attending: Cardiology

## 2024-07-21 ENCOUNTER — Telehealth: Payer: Self-pay | Admitting: *Deleted

## 2024-07-21 DIAGNOSIS — I5022 Chronic systolic (congestive) heart failure: Secondary | ICD-10-CM

## 2024-07-21 DIAGNOSIS — Z95810 Presence of automatic (implantable) cardiac defibrillator: Secondary | ICD-10-CM

## 2024-07-21 NOTE — Telephone Encounter (Signed)
 Dr. MALVA, please advise. Thanks

## 2024-07-21 NOTE — Discharge Summary (Signed)
 Physician Discharge Summary   Patient: Brittany [name]  MRN: [id number] DOB: [birth year]  Admit date:     07/15/2024  Discharge date: 07/20/2024  Discharge Physician: Elgie Butter   PCP: Tanda Bleacher, MD   Recommendations at discharge:  Please follow up with PCP in one week.  Please follow up with cardiology as scheduled.   Discharge Diagnoses: Principal Problem:   Acute respiratory failure with hypoxia (HCC) Active Problems:   ICD (implantable cardioverter-defibrillator) in place   Acute combined systolic and diastolic CHF, NYHA class 4 (HCC)   COPD with acute exacerbation (HCC)   CAD (coronary artery disease)   PAF (paroxysmal atrial fibrillation) (HCC)   History of lung cancer   Hx of hypokalemia   CKD stage 3b, GFR 30-44 ml/min (HCC)   Hyponatremia    Hospital Course: Brittany [name]  is a 63 y.o. female with medical history significant of hypertension, end-stage cardiomyopathy, chronic systolic CHF, s/p ICD, CAD s/p CABG, PAF, severe COPD, history of small cell lung cancer, protein calorie malnutrition, prior tobacco abuse presented with complaints of difficulty breathing. She was admitted for acute on chronic respiratory failure with hypoxia.    Assessment and Plan:    Acute on chronic respiratory failure with hypoxia sec to acute on chronic combined systolic and diastolic  chf and copd exacerbation.  S/p ICD.  Initially required BIPAP and transitioned to [facility] oxygen HHF to keep sats greater than 90% HF team onboard and appreciate input.  Last echo from 03/2024 showed LVEF less than 20%.  Lactic acid elevated at 2.1.will need CVP monitoring to guide diuresis.  She was started on dobutamine  gtt on 8/7, to be stopped on 8/9  Restart torsemide  in am  with spironolactone  25 mg daily.  She had urine output of 1400 ml yesterday. She reports feeling better. Weaned off oxygen.  Continue with farxiga , digoxin  0.065 mg daily, Ivabradine  7.5 mg BID,.     Acute copd  exacerbation:  No wheezing heard, transition to prednisone  taper.      Hypokalemia Replaced.    Acute on Stage 3b CKD Creatinine slightly increased from 0.75 to 1.35 to 1.43 to 1.5.      Hypertension Well controlled.    Protein calorie malnutrition Nutrition consulted.      PAF Rate controlled.  On Eliquis  for anti coagulation.        Estimated body mass index is 16.97 kg/m as calculated from the following:   Height as of this encounter: 4' 11 (1.499 m).   Weight as of this encounter: 38.1 kg.     Consultants: HF team. Procedures performed: none.   Disposition: Home Diet recommendation:  Discharge Diet Orders (From admission, onward)     Start     Ordered   07/20/24 0000  Diet - low sodium heart healthy        07/20/24 1500           Cardiac diet DISCHARGE MEDICATION: Allergies as of 07/20/2024       Reactions   Lactose Intolerance (gi) Diarrhea   Codeine Nausea And Vomiting        Medication List     STOP taking these medications    Furoscix  80 MG/10ML Ctkt Generic drug: Furosemide    Trelegy Ellipta  200-62.5-25 MCG/ACT Aepb Generic drug: Fluticasone -Umeclidin-Vilant       TAKE these medications    albuterol  108 (90 Base) MCG/ACT inhaler Commonly known as: VENTOLIN  HFA Inhale 2 puffs into the lungs every 6 (six) hours as needed for wheezing  or shortness of breath.   atorvastatin  80 MG tablet Commonly known as: LIPITOR  Take 1 tablet (80 mg total) by mouth daily. What changed: when to take this   cyclobenzaprine  10 MG tablet Commonly known as: FLEXERIL  Take 1 tablet (10 mg total) by mouth 3 (three) times daily as needed for muscle spasms.   dapagliflozin  propanediol 10 MG Tabs tablet Commonly known as: Farxiga  Take 1 tablet (10 mg total) by mouth daily.   Digoxin  62.5 MCG Tabs Take 0.0625 mg by mouth daily. What changed:  medication strength how much to take when to take this   Eliquis  2.5 MG Tabs tablet Generic drug:  apixaban  TAKE 1 TABLET BY MOUTH TWICE A DAY   Ensure Original Liqd Take 1 Bottle by mouth 2 (two) times daily as needed (poor appetite).   hydrOXYzine  25 MG capsule Commonly known as: VISTARIL  TAKE 1 CAPSULE (25 MG TOTAL) BY MOUTH DAILY AS NEEDED.   ipratropium-albuterol  0.5-2.5 (3) MG/3ML Soln Commonly known as: DUONEB TAKE 3 MLS BY NEBULIZATION EVERY 4 (FOUR) HOURS AS NEEDED (FOR SHORTNESS OF BREATH).   ivabradine  7.5 MG Tabs tablet Commonly known as: CORLANOR  TAKE 1 TABLET BY MOUTH TWICE A DAY WITH FOOD   megestrol  20 MG tablet Commonly known as: MEGACE  Take 1 tablet (20 mg total) by mouth 2 (two) times daily.   metolazone  5 MG tablet Commonly known as: ZAROXOLYN  Take 1 tablet (5 mg total) by mouth as needed (as instructed by [facility] clinic). What changed:  when to take this additional instructions   multivitamin with minerals Tabs tablet Take 1 tablet by mouth daily.   nitroGLYCERIN  0.4 MG SL tablet Commonly known as: NITROSTAT  Place 1 tablet (0.4 mg total) under the tongue every 5 (five) minutes as needed for chest pain.   pantoprazole  40 MG tablet Commonly known as: PROTONIX  Take 1 tablet (40 mg total) by mouth daily at 6 (six) AM.   potassium chloride  20 MEQ packet Commonly known as: KLOR-CON  TAKE 40 MEQ TOTAL TWICE A DAY EXCEPT ON METOLAZONE  DAYS OF TUESDAYS TAKE (40 MEQ) 3 TIMES PER DAY. What changed: See the new instructions.   predniSONE  20 MG tablet Commonly known as: DELTASONE  Take 2 tablets (40 mg total) by mouth daily before breakfast for 4 days.   spironolactone  25 MG tablet Commonly known as: ALDACTONE  Take 1 tablet (25 mg total) by mouth daily. What changed:  medication strength how much to take   torsemide  20 MG tablet Commonly known as: DEMADEX  Take 5 tablets (100 mg total) by mouth 2 (two) times daily.        Follow-up Information     Tanda Bleacher, MD. Schedule an appointment as soon as possible for a visit in 1 week(s).    Specialty: Family Medicine Contact information: [address] [place] [address] [phone]                Discharge Exam: Filed Weights   07/18/24 0435 07/19/24 0500 07/20/24 0500  Weight: 40.2 kg 38.1 kg 36.3 kg   General exam: Appears calm and comfortable  Respiratory system: Clear to auscultation. Respiratory effort normal. Cardiovascular system: S1 & S2 heard, RRR. No JVD,  Gastrointestinal system: Abdomen is nondistended, soft and nontender. Central nervous system: Alert and oriented.  Extremities: Symmetric 5 x 5 power. Skin: No rashes, Psychiatry:  Mood & affect appropriate.    Condition at discharge: fair  The results of significant diagnostics from this hospitalization (including imaging, microbiology, ancillary and laboratory)  are listed below for reference.   Imaging Studies: US  EKG SITE RITE Result Date: 07/17/2024 If Site Rite image not attached, placement could not be confirmed due to current cardiac rhythm.  DG Chest Portable 1 View Result Date: 07/15/2024 CLINICAL DATA:  Shortness of breath EXAM: PORTABLE CHEST 1 VIEW COMPARISON:  04/02/2024, 12/18/2023, chest CT 02/15/2024, 11/21/2022 FINDINGS: Sternotomy. Right-sided stimulator device with ascending lead. Left-sided single lead pacing device as before. Cardiomegaly. Stable right parahilar distortion and density, probably post therapeutic change, reference CT 02/15/2024. No focal airspace disease, pleural effusion or pneumothorax IMPRESSION: Cardiomegaly with mild central congestion. Stable right parahilar distortion and density, probably post therapeutic change, reference previously performed chest CT. Electronically Signed   By: Luke Bun M.D.   On: 07/15/2024 22:16   CUP PACEART REMOTE DEVICE CHECK Result Date: 07/02/2024 ICD Scheduled remote reviewed. Normal device function.  Presenting rhythm:  Regular VS 3 NSVT on previous ICM transmissions, HR's 207-214, 6-31 beats in duration  HF diagnostics currently abnormal Next remote 91 days. LA, CVRS   Microbiology: Results for orders placed or performed during the hospital encounter of 04/01/24  Resp panel by RT-PCR (RSV, Flu A&B, Covid) Anterior Nasal Swab     Status: None   Collection Time: 04/01/24  6:23 AM   Specimen: Anterior Nasal Swab  Result Value Ref Range Status   SARS Coronavirus 2 by RT PCR NEGATIVE NEGATIVE Final   Influenza A by PCR NEGATIVE NEGATIVE Final   Influenza B by PCR NEGATIVE NEGATIVE Final    Comment: (NOTE) The Xpert Xpress SARS-CoV-2/FLU/RSV plus assay is intended as an aid in the diagnosis of influenza from Nasopharyngeal swab specimens and should not be used as a sole basis for treatment. Nasal washings and aspirates are unacceptable for Xpert Xpress SARS-CoV-2/FLU/RSV testing.  Fact Sheet for Patients: BloggerCourse.com  Fact Sheet for Healthcare Providers: SeriousBroker.it  This test is not yet approved or cleared by the [place]  FDA and has been authorized for detection and/or diagnosis of SARS-CoV-2 by FDA under an Emergency Use Authorization (EUA). This EUA will remain in effect (meaning this test can be used) for the duration of the COVID-19 declaration under Section 564(b)(1) of the Act, 21 U.S.C. section 360bbb-3(b)(1), unless the authorization is terminated or revoked.     Resp Syncytial Virus by PCR NEGATIVE NEGATIVE Final    Comment: (NOTE) Fact Sheet for Patients: BloggerCourse.com  Fact Sheet for Healthcare Providers: SeriousBroker.it  This test is not yet approved or cleared by the [place]  FDA and has been authorized for detection and/or diagnosis of SARS-CoV-2 by FDA under an Emergency Use Authorization (EUA). This EUA will remain in effect (meaning this test can be used) for the duration of the COVID-19 declaration under Section 564(b)(1) of the Act,  21 U.S.C. section 360bbb-3(b)(1), unless the authorization is terminated or revoked.  Performed at [facility], [address], [place], [place] [address]    *Note: Due to a large number of results and/or encounters for the requested time period, some results have not been displayed. A complete set of results can be found in Results Review.    Labs: CBC: Recent Labs  Lab 07/15/24 2131 07/17/24 0337 07/18/24 0417  WBC 6.9 10.4 8.0  HGB 12.1 13.4 12.5  HCT 40.3 43.7 39.4  MCV 94.2 91.2 88.7  PLT 156 191 167   Basic Metabolic Panel: Recent Labs  Lab 07/16/24 0024 07/16/24 0800 07/17/24 0337 07/17/24 1435 07/18/24 0417 07/19/24 0455 07/20/24 0539  NA  --    < >  138 138 143 135 139  K  --    < > 4.4 4.2 4.3 3.6 3.2*  CL  --    < > 105 105 102 93* 96*  CO2  --    < > 21* 24 29 28 29   GLUCOSE  --    < > 175* 142* 180* 219* 120*  BUN  --    < > 31* 36* 49* 62* 71*  CREATININE  --    < > 1.35* 1.27* 1.43* 1.57* 1.53*  CALCIUM   --    < > 9.5 9.3 10.0 9.6 9.5  MG 3.0*  --  2.5*  --   --   --   --    < > = values in this interval not displayed.   Liver Function Tests: Recent Labs  Lab 07/15/24 2250  AST 21  ALT 11  ALKPHOS 44  BILITOT 0.7  PROT 3.9*  ALBUMIN  2.0*   CBG: Recent Labs  Lab 07/19/24 1226 07/19/24 1620 07/19/24 2105 07/20/24 0608 07/20/24 1050  GLUCAP 249* 284* 269* 177* 175*    Discharge time spent: 39 minutes.  Signed: Elgie Butter, MD [facility] 07/21/2024

## 2024-07-22 ENCOUNTER — Other Ambulatory Visit (HOSPITAL_COMMUNITY)

## 2024-07-22 NOTE — Transitions of Care (Post Inpatient/ED Visit) (Signed)
 07/22/2024  Name: Brittany [name]  MRN: [id number] DOB: 01/17/1961  Today's TOC FU Call Status: Today's TOC FU Call Status:: Successful TOC FU Call Completed TOC FU Call Complete Date: 07/21/24 Patient's Name and Date of Birth confirmed.  Transition Care Management Follow-up Telephone Call Date of Discharge: 07/20/24 Discharge Facility: [facility]) Type of Discharge: Inpatient Admission Primary Inpatient Discharge Diagnosis:: Brittany [name] How have you been since you were released from the hospital?: Better Any questions or concerns?: Yes Patient Questions/Concerns:: I have not received my nebulizer Patient Questions/Concerns Addressed: Other: (RN called [employer] and left message for call back)  Items Reviewed: Did you receive and understand the discharge instructions provided?: No Medications obtained,verified, and reconciled?: Yes (Medications Reviewed) Any new allergies since your discharge?: No Dietary orders reviewed?: No Do you have support at home?: Yes People in Home [RPT]: alone Name of Support/Comfort Primary Source: Brittany [name]  Medications Reviewed Today: Medications Reviewed Today     Reviewed by Kennieth Cathlean DEL, RN (Case Manager) on 07/22/24 at 1500  Med List Status: <None>   Medication Order Taking? Sig Documenting Provider Last Dose Status Informant  albuterol  (VENTOLIN  HFA) 108 (90 Base) MCG/ACT inhaler [id number] Yes Inhale 2 puffs into the lungs every 6 (six) hours as needed for wheezing or shortness of breath. Fairy Frames, MD  Active Self, Pharmacy Records  atorvastatin  (LIPITOR ) 80 MG tablet [id number] Yes Take 1 tablet (80 mg total) by mouth daily.  Patient taking differently: Take 80 mg by mouth at bedtime.   Nicholas Bar, MD  Active Self, Pharmacy Records  cyclobenzaprine  (FLEXERIL ) 10 MG tablet [id number] Yes Take 1 tablet (10 mg total) by mouth 3 (three) times daily as needed for muscle spasms. Tanda Bleacher, MD  Active Self, Pharmacy Records   dapagliflozin  propanediol (FARXIGA ) 10 MG TABS tablet [id number]  Take 1 tablet (10 mg total) by mouth daily.  Patient not taking: Reported on 07/22/2024   BensimhonToribio SAUNDERS, MD  Active Self, Pharmacy Records           Med Note (COFFELL, JON HERO   Wed Jul 16, 2024  8:22 AM) Patient states she ran out of medication about 3 weeks ago, however no fill history found since 2024.  digoxin  62.5 MCG TABS [id number] Yes Take 0.0625 mg by mouth daily. Cherlyn Labella, MD  Active   ELIQUIS  2.5 MG TABS tablet [id number] Yes TAKE 1 TABLET BY MOUTH TWICE A DAY Bensimhon, Toribio SAUNDERS, MD  Active Self, Pharmacy Records           Med Note (COFFELL, JON HERO   Wed Jul 16, 2024  8:30 AM) Patient endorses taking twice daily. Per dispense report, no fill history found since January 2025 (90DS).  hydrOXYzine  (VISTARIL ) 25 MG capsule [id number] Yes TAKE 1 CAPSULE (25 MG TOTAL) BY MOUTH DAILY AS NEEDED. Lorren Greig PARAS, NP  Active Self, Pharmacy Records  ipratropium-albuterol  (DUONEB) 0.5-2.5 (3) MG/3ML LARRAINE [id number] Yes TAKE 3 MLS BY NEBULIZATION EVERY 4 (FOUR) HOURS AS NEEDED (FOR SHORTNESS OF BREATH). Tanda Bleacher, MD  Active Self, Pharmacy Records  ivabradine  (CORLANOR ) 7.5 MG TABS tablet [id number] Yes TAKE 1 TABLET BY MOUTH TWICE A DAY WITH FOOD Bensimhon, Toribio SAUNDERS, MD  Active Self, Pharmacy Records  megestrol  (MEGACE ) 20 MG tablet [id number] Yes Take 1 tablet (20 mg total) by mouth 2 (two) times daily. Tanda Bleacher, MD  Active Self, Pharmacy Records  metolazone  (ZAROXOLYN ) 5 MG tablet [id number] Yes Take 1 tablet (5 mg total) by mouth as needed (as instructed  by [facility] clinic).  Patient taking differently: Take 5 mg by mouth See admin instructions. Take 1 tablet (5mg ) by mouth once a week, as needed for fluid retention.   Bensimhon, Toribio SAUNDERS, MD  Active Self, Pharmacy Records  Multiple Vitamin (MULTIVITAMIN WITH MINERALS) TABS tablet [id number] Yes Take 1 tablet by mouth daily. [provider]  Active Self,  Pharmacy Records  nitroGLYCERIN  (NITROSTAT ) 0.4 MG SL tablet [id number]  Place 1 tablet (0.4 mg total) under the tongue every 5 (five) minutes as needed for chest pain.  Patient not taking: Reported on 07/22/2024   Lee, [place], NP  Active Self, Pharmacy Records  Nutritional Supplements [facility]) BERNICE [id number] Yes Take 1 Bottle by mouth 2 (two) times daily as needed (poor appetite). [provider]  Active Self, Pharmacy Records           Med Note [facility], DONETA GORMAN Schaumann Apr 19, 2023  3:53 AM)    pantoprazole  (PROTONIX ) 40 MG tablet [id number] Yes Take 1 tablet (40 mg total) by mouth daily at 6 (six) AM. Akula, Vijaya, MD  Active   potassium chloride  (KLOR-CON ) 20 MEQ packet [id number] Yes TAKE 40 MEQ TOTAL TWICE A DAY EXCEPT ON METOLAZONE  DAYS OF TUESDAYS TAKE (40 MEQ) 3 TIMES PER DAY. Bensimhon, Daniel R, MD  Active   predniSONE  (DELTASONE ) 20 MG tablet [id number] Yes Take 2 tablets (40 mg total) by mouth daily before breakfast for 4 days. Akula, Vijaya, MD  Active   spironolactone  (ALDACTONE ) 25 MG tablet [id number] Yes Take 1 tablet (25 mg total) by mouth daily. Akula, Vijaya, MD  Active   torsemide  (DEMADEX ) 20 MG tablet [id number] Yes Take 5 tablets (100 mg total) by mouth 2 (two) times daily. Cherlyn Labella, MD  Active             Home Care and Equipment/Supplies: Were Home Health Services Ordered?: No Any new equipment or medical supplies ordered?: Yes Name of Medical supply agency?: [employer] Were you able to get the equipment/medical supplies?: No (RN called [employer]. Awaiting for call back)  Functional Questionnaire: Do you need assistance with bathing/showering or dressing?: No Do you need assistance with meal preparation?: Yes Do you need assistance with eating?: No Do you have difficulty maintaining continence: No Do you have difficulty managing or taking your medications?: No  Follow up appointments reviewed: PCP Follow-up appointment confirmed?: No MD Provider Line  Number:[phone] Given: Yes Specialist Hospital Follow-up appointment confirmed?: Yes Date of Specialist follow-up appointment?: 07/30/24 Follow-Up Specialty Provider:: [facility] Do you need transportation to your follow-up appointment?: No Do you understand care options if your condition(s) worsen?: Yes-patient verbalized understanding  SDOH Interventions Today    Flowsheet Row Most Recent Value  SDOH Interventions   Food Insecurity Interventions Intervention Not Indicated  Housing Interventions Intervention Not Indicated  [lives in extended hotel]  Transportation Interventions Intervention Not Indicated  Utilities Interventions Intervention Not Indicated   RN called [employer] to follow up on delivery of nebulizer.  Cathlean Headland BSN RN [place] [facility] Health Care Management Coordinator [email or web address] Direct Dial: [phone]  Fax: [phone] Website: [email or web address]

## 2024-07-22 NOTE — Progress Notes (Signed)
 EPIC Encounter for ICM Monitoring  Patient Name: Brittany [name]  is a 63 y.o. female Date: 07/22/2024 Primary Care Physican: Tanda Bleacher, MD Primary Cardiologist: Bensimhon Electrophysiologist: Cindie 01/03/2024 Weight: 93-94 lbs  01/22/2024 Weight: 91 lbs (lowest weight 84 lbs) 04/18/2024 Office Weight:  99 lbs 05/27/2024 Weight: 87 lbs 07/14/2024 Weight: 89 lbs   Spoke with patient and heart failure questions reviewed.  Transmission results reviewed.  Pt is feeling better but drained of energy after hospital discharge.     Hospitalized 8/5-8/10 for HF exacerbation.    Optivol Thoracic impedance suggesting fluid levels returned to normal 8/8 after being diuresed in the hospital.    Prescribed: Torsemide  20 mg take 5 tablets (100 mg total) by mouth two times a day.  Potassium 20 mEq packet take 40 mEq total by mouth twice day.  Take extra 60 mEq (3 packets) when you take Metolazone  or Furoscix .   Spironolactone  50 mg take 1 tablet (50 mg total) by mouth daily Furosemide  (Furoscix ) 80 mg/10 ml as directed   Labs: 07/20/2024 Creatinine 1.53, BUN 71, Potassium 3.2, Sodium 139, GFR 38  07/19/2024 Creatinine 1.57, BUN 62, Potassium 3.6, Sodium 135, GFR 37  07/18/2024 Creatinine 1.43, BUN 49, Potassium 4.3, Sodium 143, GFR 41  07/17/2024 Creatinine 1.27, BUN 36, Potassium 4.2, Sodium 138, GFR 48 07/16/2024 Creatinine 1.39, BUN 23, Potassium 4.4, Sodium 137, GFR 43  07/15/2024 Creatinine 0.79, BUN 14, Potassium 2.2, Sodium 146, GFR >60 A complete set of results can be found in Results Review.   Recommendations:  No changes and encouraged to call if experiencing any fluid symptoms.     Follow-up plan: ICM clinic phone appointment on 07/28/2024 to recheck fluid levels.   91 day device clinic remote transmission 09/29/2024.     EP/Cardiology Office Visits:  07/30/2024 with HF Clinic.  Recall 07/26/2024 with [name], PA   Copy of ICM check sent to Dr. Cindie.     3 month ICM  trend: 07/21/2024.    12-14 Month ICM trend:     [name], RN 07/22/2024 2:04 PM

## 2024-07-23 ENCOUNTER — Telehealth: Payer: Self-pay | Admitting: *Deleted

## 2024-07-23 ENCOUNTER — Telehealth: Payer: Self-pay | Admitting: Family Medicine

## 2024-07-23 NOTE — Transitions of Care (Post Inpatient/ED Visit) (Signed)
   07/23/2024  Name: Brittany [name]  MRN: [id number] DOB: 09-01-1961    RN telephone call  to [employer] to follow up on Nebulizer delivery. Per [employer] they did not receive the order. RN called Dr office and spoke with Tiffany. Message sent to Dr Raguel Nurse to get order for nebulizer sent to [employer] that was initially ordered by Hospitalist. RN called patient and made her aware.    Cathlean Headland BSN RN [facility] Health Care Management Coordinator [email or web address] Direct Dial: [phone]  Fax: [phone] Website: [email or web address]

## 2024-07-23 NOTE — Telephone Encounter (Unsigned)
 Copied from CRM [id number]. Topic: Clinical - Medical Advice >> Jul 23, 2024 10:17 AM Tiffini S wrote: Reason for CRM:  Cathlean Headland [phone] with [facility] Based Care patient was discharged on 07/20/24 from [facility] hospitalist has ordered a nebulizer machine per [employer] they never received a order. Per [employer] need a order for the nebulizer machine. Please call [place] with update.

## 2024-07-24 NOTE — Telephone Encounter (Signed)
Pt is scheduled for 8/21 

## 2024-07-28 ENCOUNTER — Ambulatory Visit: Attending: Cardiology

## 2024-07-28 DIAGNOSIS — I5022 Chronic systolic (congestive) heart failure: Secondary | ICD-10-CM | POA: Diagnosis not present

## 2024-07-28 DIAGNOSIS — Z95810 Presence of automatic (implantable) cardiac defibrillator: Secondary | ICD-10-CM | POA: Diagnosis not present

## 2024-07-29 ENCOUNTER — Telehealth (HOSPITAL_COMMUNITY): Payer: Self-pay

## 2024-07-29 NOTE — Telephone Encounter (Signed)
-----   Message from Harlene CHRISTELLA Gainer sent at 07/29/2024  1:59 PM EDT ----- Thanks Mitzie. SHe is seeing Manuelita tomorrow and I am forwarding this to her. I an CC'ing [place] our clinic RN to see if we can do anything before her visit tomorrow  Hey Tezra Mahr, can you see if we can get a dose of Furoscix  to her before her appt tomorrow? Along with 40 KCL. ----- Message ----- From: Armanda Mitzie RAMAN, RN Sent: 07/29/2024   1:06 PM EDT To: Harlene CHRISTELLA Gainer, FNP  Walterine Harlene, patient has visit with your tomorrow, 8/20.  Please see the notes regarding Furoscix .

## 2024-07-29 NOTE — Progress Notes (Signed)
 EPIC Encounter for ICM Monitoring  Patient Name: Brittany [name]  is a 64 y.o. female Date: 07/29/2024 Primary Care Physican: Tanda Bleacher, MD Primary Cardiologist: Bensimhon Electrophysiologist: Cindie 01/03/2024 Weight: 93-94 lbs  01/22/2024 Weight: 91 lbs (lowest weight 84 lbs) 04/18/2024 Office Weight:  99 lbs 05/27/2024 Weight: 87 lbs 07/14/2024 Weight: 89 lbs   Spoke with patient and heart failure questions reviewed.  Transmission results reviewed.  Pt symptomatic for fluid accumulation and experiencing SOB, sitting up to breath at night, and feels like a band is around the chest which for her indicates fluid.   Optivol Thoracic impedance suggesting possible fluid accumulation starting 8/12.    Prescribed: Torsemide  20 mg take 5 tablets (100 mg total) by mouth two times a day.  Potassium 20 mEq packet take 40 mEq total by mouth twice day.  Take extra 60 mEq (3 packets) when you take Metolazone  or Furoscix .   Spironolactone  50 mg take 1 tablet (50 mg total) by mouth daily Furosemide  (Furoscix ) 80 mg/10 ml as directed  8/19 Furoscix  was stopped at 8/10 hospital discharge   Labs: 07/20/2024 Creatinine 1.53, BUN 71, Potassium 3.2, Sodium 139, GFR 38  07/19/2024 Creatinine 1.57, BUN 62, Potassium 3.6, Sodium 135, GFR 37  07/18/2024 Creatinine 1.43, BUN 49, Potassium 4.3, Sodium 143, GFR 41  07/17/2024 Creatinine 1.27, BUN 36, Potassium 4.2, Sodium 138, GFR 48 07/16/2024 Creatinine 1.39, BUN 23, Potassium 4.4, Sodium 137, GFR 43  07/15/2024 Creatinine 0.79, BUN 14, Potassium 2.2, Sodium 146, GFR >60 A complete set of results can be found in Results Review.   Recommendations:  Pt took 1 Metolazone  5 mg (8/19 at 1:45 PM) with extra 60 mEq of potassium due to she never received Furoscix  order that was placed prior to 8/6 hospitalization.   Confirmed patient will be at 8/20 HF Clinic appt  Copy sent to [name], NP at [facility] clinic for the following:  To inform pt took Metolazone   today, 8/19 for fluid sx  Pt does not have Furoscix  at home and the med was discontinued from med last at 8/10 hospital discharge.  Inquire if it would be beneficial for patient to meet with HF clinic pharmacist to discuss benefits of having all meds transferred to [facility] so meds can be delivered to patient.    Follow-up plan: ICM clinic phone appointment on 08/05/2024 to recheck fluid levels.   91 day device clinic remote transmission 09/29/2024.     EP/Cardiology Office Visits:  07/30/2024 with HF Clinic.  Recall 07/26/2024 with [name], PA   Copy of ICM check sent to Dr. Cindie.     3 month ICM trend: 07/28/2024.    12-14 Month ICM trend:     [name], RN 07/29/2024 9:24 AM

## 2024-07-29 NOTE — Progress Notes (Signed)
 Spoke with patient.  She received a call from HF clinic but was not able to get to the office today for Furoscix .  She will pick up the Furoscix  tomorrow at her appt.

## 2024-07-29 NOTE — Telephone Encounter (Signed)
 Attempted to call patient to see if she could get a sample of Furoscix  from the office today, patient reports she will not be able to come today. Reports she will come to appointment tomorrow as scheduled.   Provider made aware.

## 2024-07-29 NOTE — Progress Notes (Signed)
  Received: Today [name], Harlene HERO, FNP  Ayda Tancredi, Mitzie RAMAN, RN; Bullins, Jenna B, RN Thanks Mitzie. SHe is seeing Manuelita tomorrow and I am forwarding this to her. I an CC'ing [place] our clinic RN to see if we can do anything before her visit tomorrow  Hey Jenna, can you see if we can get a dose of Furoscix  to her before her appt tomorrow? Along with 40 KCL.

## 2024-07-30 ENCOUNTER — Other Ambulatory Visit (HOSPITAL_COMMUNITY): Payer: Self-pay

## 2024-07-30 ENCOUNTER — Ambulatory Visit (HOSPITAL_COMMUNITY): Payer: Self-pay | Admitting: Physician Assistant

## 2024-07-30 ENCOUNTER — Other Ambulatory Visit: Payer: Self-pay

## 2024-07-30 ENCOUNTER — Ambulatory Visit (HOSPITAL_COMMUNITY)
Admit: 2024-07-30 | Discharge: 2024-07-30 | Disposition: A | Discharge status: Home / Self Care | Source: Ambulatory Visit | Attending: Physician Assistant | Admitting: Physician Assistant

## 2024-07-30 ENCOUNTER — Encounter (HOSPITAL_COMMUNITY): Payer: Self-pay

## 2024-07-30 VITALS — BP 98/56 | HR 91 | Ht 59.5 in | Wt 88.8 lb

## 2024-07-30 DIAGNOSIS — Z7901 Long term (current) use of anticoagulants: Secondary | ICD-10-CM | POA: Insufficient documentation

## 2024-07-30 DIAGNOSIS — J9611 Chronic respiratory failure with hypoxia: Secondary | ICD-10-CM | POA: Insufficient documentation

## 2024-07-30 DIAGNOSIS — I2510 Atherosclerotic heart disease of native coronary artery without angina pectoris: Secondary | ICD-10-CM | POA: Diagnosis not present

## 2024-07-30 DIAGNOSIS — I252 Old myocardial infarction: Secondary | ICD-10-CM | POA: Insufficient documentation

## 2024-07-30 DIAGNOSIS — J4489 Other specified chronic obstructive pulmonary disease: Secondary | ICD-10-CM | POA: Diagnosis not present

## 2024-07-30 DIAGNOSIS — Z955 Presence of coronary angioplasty implant and graft: Secondary | ICD-10-CM | POA: Insufficient documentation

## 2024-07-30 DIAGNOSIS — I130 Hypertensive heart and chronic kidney disease with heart failure and stage 1 through stage 4 chronic kidney disease, or unspecified chronic kidney disease: Secondary | ICD-10-CM | POA: Insufficient documentation

## 2024-07-30 DIAGNOSIS — N1832 Chronic kidney disease, stage 3b: Secondary | ICD-10-CM | POA: Insufficient documentation

## 2024-07-30 DIAGNOSIS — Z66 Do not resuscitate: Secondary | ICD-10-CM | POA: Diagnosis not present

## 2024-07-30 DIAGNOSIS — Z951 Presence of aortocoronary bypass graft: Secondary | ICD-10-CM | POA: Insufficient documentation

## 2024-07-30 DIAGNOSIS — Z7984 Long term (current) use of oral hypoglycemic drugs: Secondary | ICD-10-CM | POA: Diagnosis not present

## 2024-07-30 DIAGNOSIS — I5022 Chronic systolic (congestive) heart failure: Secondary | ICD-10-CM | POA: Diagnosis present

## 2024-07-30 DIAGNOSIS — R627 Adult failure to thrive: Secondary | ICD-10-CM | POA: Insufficient documentation

## 2024-07-30 DIAGNOSIS — E1122 Type 2 diabetes mellitus with diabetic chronic kidney disease: Secondary | ICD-10-CM | POA: Diagnosis not present

## 2024-07-30 DIAGNOSIS — I480 Paroxysmal atrial fibrillation: Secondary | ICD-10-CM | POA: Insufficient documentation

## 2024-07-30 DIAGNOSIS — Z79899 Other long term (current) drug therapy: Secondary | ICD-10-CM | POA: Insufficient documentation

## 2024-07-30 DIAGNOSIS — Z87891 Personal history of nicotine dependence: Secondary | ICD-10-CM | POA: Insufficient documentation

## 2024-07-30 DIAGNOSIS — I5084 End stage heart failure: Secondary | ICD-10-CM | POA: Diagnosis not present

## 2024-07-30 LAB — COMPREHENSIVE METABOLIC PANEL WITH GFR
ALT: 19 U/L (ref 0–44)
AST: 25 U/L (ref 15–41)
Albumin: 3.8 g/dL (ref 3.5–5.0)
Alkaline Phosphatase: 81 U/L (ref 38–126)
Anion gap: 15 (ref 5–15)
BUN: 29 mg/dL — ABNORMAL HIGH (ref 8–23)
CO2: 27 mmol/L (ref 22–32)
Calcium: 9.8 mg/dL (ref 8.9–10.3)
Chloride: 98 mmol/L (ref 98–111)
Creatinine, Ser: 1.34 mg/dL — ABNORMAL HIGH (ref 0.44–1.00)
GFR, Estimated: 45 mL/min — ABNORMAL LOW (ref >60)
Glucose, Bld: 75 mg/dL (ref 70–99)
Potassium: 3.1 mmol/L — ABNORMAL LOW (ref 3.5–5.1)
Sodium: 140 mmol/L (ref 135–145)
Total Bilirubin: 0.9 mg/dL (ref 0.0–1.2)
Total Protein: 8.1 g/dL (ref 6.5–8.1)

## 2024-07-30 LAB — BRAIN NATRIURETIC PEPTIDE: B Natriuretic Peptide: 1826.2 pg/mL — ABNORMAL HIGH (ref 0.0–100.0)

## 2024-07-30 MED ORDER — APIXABAN 2.5 MG PO TABS
2.5000 mg | ORAL_TABLET | Freq: Two times a day (BID) | ORAL | 1 refills | Status: DC
Start: 2024-07-30 — End: 2024-10-22
  Filled 2024-07-30: qty 180, 90d supply, fill #0

## 2024-07-30 MED ORDER — FUROSCIX 80 MG/10ML ~~LOC~~ CTKT
80.0000 mg | CARTRIDGE | Freq: Every day | SUBCUTANEOUS | 0 refills | Status: DC
  Filled 2024-07-30 – 2024-08-15 (×5): qty 3, 3d supply, fill #0

## 2024-07-30 MED ORDER — DAPAGLIFLOZIN PROPANEDIOL 10 MG PO TABS
10.0000 mg | ORAL_TABLET | Freq: Every day | ORAL | 3 refills | Status: DC
  Filled 2024-07-30: qty 90, 90d supply, fill #0

## 2024-07-30 NOTE — Patient Instructions (Signed)
 Good to see you today!  Your provider has order Furoscix  for you. This is an on-body infuser that gives you a dose of Furosemide .   It will be shipped to your home from [facility], they will call you before shipping  Ensure you write down the time you start your infusion so that if there is a problem you will know how long the infusion lasted  Use Furoscix  only AS DIRECTED by our office  Dosing Directions:   Day 1= Thursday  furoscix  kit with additional 60 meq of potassium  Day 2= Friday  furoscix  kit with additional 60 meq of  potassium  Continue taking evening torsemide    Labs done today, your results will be available in MyChart, we will contact you for abnormal readings.  Your physician recommends that you schedule a follow-up appointment 1 week as scheduled  If you have any questions or concerns before your next appointment please send us  a message through [place] or call our office at [phone].    TO LEAVE A MESSAGE FOR THE NURSE SELECT OPTION 2, PLEASE LEAVE A MESSAGE INCLUDING: YOUR NAME DATE OF BIRTH CALL BACK NUMBER REASON FOR CALL**this is important as we prioritize the call backs  YOU WILL RECEIVE A CALL BACK THE SAME DAY AS LONG AS YOU CALL BEFORE 4:00 PM At the [facility], you and your health needs are our priority. As part of our continuing mission to provide you with exceptional heart care, we have created designated Provider Care Teams. These Care Teams include your primary Cardiologist (physician) and Advanced Practice Providers (APPs- Physician Assistants and Nurse Practitioners) who all work together to provide you with the care you need, when you need it.   You may see any of the following providers on your designated Care Team at your next follow up: Dr Toribio Fuel Dr Ezra Shuck Dr. Ria Commander Dr. Morene Brownie Amy Lenetta, NP Caffie Shed, [place] [facility], [place] Beckey Coe,  NP [place] Lee, NP Ellouise Class, NP Tinnie Redman, PharmD Jaun Bash, PharmD   Please be sure to bring in all your medications bottles to every appointment.    Thank you for choosing  [facility]

## 2024-07-30 NOTE — Telephone Encounter (Signed)
 Her potassium is low, please confirm she is taking 40 mEq BID, with extra 60 mEq with Fursocix and extra 40 when using metolazone .  Increase KCL to 60 meq BID.   Labs at f/u next week.

## 2024-07-30 NOTE — Progress Notes (Signed)
 Medication Samples have been provided to the patient.  Drug name: Furoscix        Strength: 80 mg        Qty: 2  LOT: [id number]  Exp.Date: 07/10/2025  Dosing instructions: take as directed by clinic  The patient has been instructed regarding the correct time, dose, and frequency of taking this medication, including desired effects and most common side effects.   [name] Sees 3:05 PM 07/30/2024

## 2024-07-30 NOTE — Progress Notes (Signed)
 Specialty Pharmacy Initial Fill Coordination Note  Brittany [name]  is a 63 y.o. female contacted today regarding initial fill of specialty medication(s) Furosemide  (Furoscix )   Patient requested [name] at [facility] [place] date: 07/31/24   Medication will be filled on 07/31/2024.   Patient is aware of $0 copayment.  Disenrolling patient as this is a one time fill.

## 2024-07-30 NOTE — Progress Notes (Signed)
 Advanced Heart Failure Clinic Note  PCP:  Tanda Bleacher, MD  Oncology: Dr Gatha  HF Cardiologist: Dr Cherrie  HPI: Brittany [name]  is a 63 y.o. 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 Underwent emergent cath 02/03/18 showed LAD 100% stenosed, LCx 95% stenosed. Taken for emergent CABG 02/03/18. Required impella post op. Hospital course complicated by cardiogenic shock, HCAP, A fib, respiratory failure, and swallowing issues. She was discharged to SNF. Discharge weight 103 pounds.    In 2019 had multiple hospitalizations for HF and pleural effusion. Underwent pleurodesis at [facility].   Admitted 3/20 with NSTEMI and HF. Received DES to ostial ramus into distal left main based on cath below and underwent diuresis. Meds adjusted as tolerated. Echo with EF 25-30%.    Admitted twice in 2023 with low output requiring milrinone . Barostim placed.   Multiple CHF and AECOPD admission in May, August, October of 2024 and Janurary 2025. Echo 1/25 EF  < 20%, G2DD, RV moderately to severely reduced. She remained DNR.  Admitted 3/25 for CHF and AECOPD. Readmitted 4/25 for CHF/COPD. Was hypotensive. Moved to ICU. Required DBA and IV diuresis. Refused [facility] services.   She was readmitted earlier this month with acute on chronic hypoxic respiratory failure 2/2 AECP and acute on chronic CHF. She required inotrope support with DBA which was wean off prior to discharge.   Remote ICD interrogation 08/18 indicated fluid accumulation since 08/12. We attempted to arrange for Furoscix  08/19 but she was unable to get to clinic to pick up the kit. She did use 5 mg metolazone .  She is here today for post hospital CHF  follow-up.  Reports a little bit of improvement in dyspnea after metolazone  yesterday but still short of breath with little exertion. Notes conversational dyspnea and significant fatigue. No lower extremity edema. No change in diet. She feels that she responds better to furoscix  than metolazone . She is taking all meds except farxiga  (she wasn't sure if she should take) and eliquis  (ran out). Requesting to have meds delivered to her home, has to pay someone to take her to the pharmacy.    Past Medical History:  Diagnosis Date   Acute respiratory failure (HCC) 05/05/2018   Acute systolic congestive heart failure (HCC) 02/03/2018   AICD (automatic cardioverter/defibrillator) present    Allergy    Anxiety    Asthma    DM2 (diabetes mellitus, type 2) (HCC) 10/20/2020   Hypertension    PAF (paroxysmal atrial fibrillation) (HCC)    Presence of permanent cardiac pacemaker    Prophylactic measure 08/03/14-08/19/14   Prophyl. 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   LIMA to LAD, SVG to D1, SVG to OM1, EVH via right thigh with implantation of Impella LD LVAD via direct aortic approach   Small cell lung cancer (HCC) 03/16/2014   Past Surgical History:  Procedure Laterality Date   BRONCHIAL BRUSHINGS  10/25/2020   Procedure: BRONCHIAL BRUSHINGS;  Surgeon: Shelah Lamar RAMAN, MD;  Location: [facility] ENDOSCOPY;  Service: Pulmonary;;   BRONCHIAL NEEDLE ASPIRATION BIOPSY  10/25/2020   Procedure: BRONCHIAL NEEDLE ASPIRATION BIOPSIES;  Surgeon: Shelah Lamar RAMAN, MD;  Location: [facility] ENDOSCOPY;  Service: Pulmonary;;  CARDIAC DEFIBRILLATOR PLACEMENT  08/15/2018   MDT Visia AF MRI VR ICD implanted by Dr Zulma for primary prevention of sudden   CESAREAN SECTION     CORONARY ARTERY BYPASS GRAFT N/A 02/03/2018   Procedure: CORONARY ARTERY BYPASS GRAFTING (CABG);  Surgeon: Dusty Sudie DEL, MD;  Location: [facility] OR;  Service: Open Heart Surgery;  Laterality: N/A;  Time 3 using left internal mammary artery  and endoscopically harvested right saphenous vein   CORONARY BALLOON ANGIOPLASTY N/A 02/03/2018   Procedure: CORONARY BALLOON ANGIOPLASTY;  Surgeon: [place], Peter M, MD;  Location: [facility] INVASIVE CV LAB;  Service: Cardiovascular;  Laterality: N/A;   CORONARY STENT INTERVENTION N/A 02/12/2019   Procedure: CORONARY STENT INTERVENTION;  Surgeon: Darron Deatrice LABOR, MD;  Location: [facility] INVASIVE CV LAB;  Service: Cardiovascular;  Laterality: N/A;   CORONARY/GRAFT ACUTE MI REVASCULARIZATION N/A 02/03/2018   Procedure: Coronary/Graft Acute MI Revascularization;  Surgeon: [place], Peter M, MD;  Location: [facility] INVASIVE CV LAB;  Service: Cardiovascular;  Laterality: N/A;   ENDOBRONCHIAL ULTRASOUND N/A 10/25/2020   Procedure: ENDOBRONCHIAL ULTRASOUND;  Surgeon: Shelah Lamar RAMAN, MD;  Location: [facility] ENDOSCOPY;  Service: Pulmonary;  Laterality: N/A;   FLEXIBLE BRONCHOSCOPY  10/25/2020   Procedure: FLEXIBLE BRONCHOSCOPY;  Surgeon: Shelah Lamar RAMAN, MD;  Location: [facility] ENDOSCOPY;  Service: Pulmonary;;   IABP INSERTION N/A 02/03/2018   Procedure: IABP Insertion;  Surgeon: [place], Peter M, MD;  Location: [facility] INVASIVE CV LAB;  Service: Cardiovascular;  Laterality: N/A;   INTRAOPERATIVE TRANSESOPHAGEAL ECHOCARDIOGRAM N/A 02/03/2018   Procedure: INTRAOPERATIVE TRANSESOPHAGEAL ECHOCARDIOGRAM;  Surgeon: Dusty Sudie DEL, MD;  Location: [facility] OR;  Service: Open Heart Surgery;  Laterality: N/A;   LEFT HEART CATH AND CORONARY ANGIOGRAPHY N/A 02/03/2018   Procedure: LEFT HEART CATH AND CORONARY ANGIOGRAPHY;  Surgeon: [place], Peter M, MD;  Location: [facility] INVASIVE CV LAB;  Service: Cardiovascular;  Laterality: N/A;   LEFT HEART CATH AND CORS/GRAFTS ANGIOGRAPHY N/A 02/12/2019   Procedure: LEFT HEART CATH AND CORS/GRAFTS ANGIOGRAPHY;  Surgeon: Darron Deatrice LABOR, MD;  Location: [facility] INVASIVE CV LAB;  Service: Cardiovascular;  Laterality: N/A;   MEDIASTINOSCOPY N/A 03/11/2014   Procedure: MEDIASTINOSCOPY;  Surgeon: Elspeth JAYSON Millers, MD;  Location: [facility] OR;   Service: Thoracic;  Laterality: N/A;   PLACEMENT OF IMPELLA LEFT VENTRICULAR ASSIST DEVICE  02/03/2018   Procedure: PLACEMENT OF IMPELLA LEFT VENTRICULAR ASSIST DEVICE LD;  Surgeon: Dusty Sudie DEL, MD;  Location: [facility] OR;  Service: Open Heart Surgery;;   REMOVAL OF IMPELLA LEFT VENTRICULAR ASSIST DEVICE N/A 02/08/2018   Procedure: REMOVAL OF IMPELLA LEFT VENTRICULAR ASSIST DEVICE;  Surgeon: Dusty Sudie DEL, MD;  Location: [facility] OR;  Service: Open Heart Surgery;  Laterality: N/A;   RIGHT HEART CATH N/A 02/03/2018   Procedure: RIGHT HEART CATH;  Surgeon: [place], Peter M, MD;  Location: [facility] INVASIVE CV LAB;  Service: Cardiovascular;  Laterality: N/A;   RIGHT HEART CATH N/A 05/09/2018   Procedure: RIGHT HEART CATH;  Surgeon: Cherrie Toribio SAUNDERS, MD;  Location: [facility] INVASIVE CV LAB;  Service: Cardiovascular;  Laterality: N/A;   TEE WITHOUT CARDIOVERSION N/A 02/08/2018   Procedure: TRANSESOPHAGEAL ECHOCARDIOGRAM (TEE);  Surgeon: Dusty Sudie DEL, MD;  Location: [facility] OR;  Service: Open Heart Surgery;  Laterality: N/A;   TUBAL LIGATION     VIDEO BRONCHOSCOPY WITH ENDOBRONCHIAL ULTRASOUND N/A 03/11/2014   Procedure: VIDEO BRONCHOSCOPY WITH ENDOBRONCHIAL ULTRASOUND;  Surgeon: Elspeth JAYSON Millers, MD;  Location: [facility] OR;  Service: Thoracic;  Laterality: N/A;   Current Outpatient Medications  Medication Sig Dispense  Refill   albuterol  (VENTOLIN  HFA) 108 (90 Base) MCG/ACT inhaler Inhale 2 puffs into the lungs every 6 (six) hours as needed for wheezing or shortness of breath. 8.5 g 1   atorvastatin  (LIPITOR ) 80 MG tablet Take 1 tablet (80 mg total) by mouth daily. 90 tablet 0   cyclobenzaprine  (FLEXERIL ) 10 MG tablet Take 1 tablet (10 mg total) by mouth 3 (three) times daily as needed for muscle spasms. 60 tablet 3   digoxin  62.5 MCG TABS Take 0.0625 mg by mouth daily. 30 tablet 1   hydrOXYzine  (VISTARIL ) 25 MG capsule TAKE 1 CAPSULE (25 MG TOTAL) BY MOUTH DAILY AS NEEDED. 90 capsule 1   ipratropium-albuterol  (DUONEB)  0.5-2.5 (3) MG/3ML SOLN TAKE 3 MLS BY NEBULIZATION EVERY 4 (FOUR) HOURS AS NEEDED (FOR SHORTNESS OF BREATH). 180 mL 3   ivabradine  (CORLANOR ) 7.5 MG TABS tablet TAKE 1 TABLET BY MOUTH TWICE A DAY WITH FOOD 120 tablet 1   megestrol  (MEGACE ) 20 MG tablet Take 1 tablet (20 mg total) by mouth 2 (two) times daily. 60 tablet 1   metolazone  (ZAROXOLYN ) 5 MG tablet Take 1 tablet (5 mg total) by mouth as needed (as instructed by Advanced HF clinic). (Patient taking differently: Take 5 mg by mouth See admin instructions. Take 1 tablet (5mg ) by mouth once a week, as needed for fluid retention.) 10 tablet 3   Multiple Vitamin (MULTIVITAMIN WITH MINERALS) TABS tablet Take 1 tablet by mouth daily.     nitroGLYCERIN  (NITROSTAT ) 0.4 MG SL tablet Place 1 tablet (0.4 mg total) under the tongue every 5 (five) minutes as needed for chest pain. 90 tablet 0   Nutritional Supplements (ENSURE ORIGINAL) LIQD Take 1 Bottle by mouth 2 (two) times daily as needed (poor appetite).     pantoprazole  (PROTONIX ) 40 MG tablet Take 1 tablet (40 mg total) by mouth daily at 6 (six) AM. 5 tablet 0   potassium chloride  (KLOR-CON ) 20 MEQ packet TAKE 40 MEQ TOTAL TWICE A DAY EXCEPT ON METOLAZONE  DAYS OF TUESDAYS TAKE (40 MEQ) 3 TIMES PER DAY. 360 packet 2   spironolactone  (ALDACTONE ) 25 MG tablet Take 1 tablet (25 mg total) by mouth daily. 30 tablet 2   torsemide  (DEMADEX ) 20 MG tablet Take 5 tablets (100 mg total) by mouth 2 (two) times daily.     apixaban  (ELIQUIS ) 2.5 MG TABS tablet Take 1 tablet (2.5 mg total) by mouth 2 (two) times daily. 180 tablet 1   dapagliflozin  propanediol (FARXIGA ) 10 MG TABS tablet Take 1 tablet (10 mg total) by mouth daily. 90 tablet 3   No current facility-administered medications for this encounter.    Allergies:   Lactose intolerance (gi) and Codeine   Social History:  The patient  reports that she quit smoking about 10 years ago. Her smoking use included cigarettes. She started smoking about 30 years  ago. She has a 20 pack-year smoking history. She has never used smokeless tobacco. She reports current alcohol use of about 5.0 standard drinks of alcohol per week. She reports current drug use. Drug: Marijuana.   Family History:  The patient's family history includes Cancer in her maternal grandmother; Diabetes in her paternal grandmother; Heart attack in her mother; Heart disease in her mother; Hypertension in her maternal grandmother and mother.   ROS:  Please see the history of present illness.   All other systems are personally reviewed and negative.    Wt Readings from Last 3 Encounters:  07/30/24 40.3 kg (88 lb 12.8 oz)  07/20/24 36.3 kg (80 lb 0.4 oz)  06/09/24 40.6 kg (89 lb 9.6 oz)   BP (!) 98/56   Pulse 91   Ht 4' 11.5 (1.511 m)   Wt 40.3 kg (88 lb 12.8 oz)   SpO2 96%   BMI 17.64 kg/m   Physical Exam General:  Thin, chronically ill appearing Cor: JVP to jaw. Regular rate & rhythm. No murmurs. Lungs: diminished Abdomen: soft, nontender, nondistended.  Extremities: no edema Neuro: alert & orientedx3. Affect pleasant   Device interrogation (personally reviewed): OptiVol up and down, trending back up since 08/12, thoracic impedance below threshold, no AT/AF or VT  Assessment & Plan: 1. End-stage Chronic Systolic Heart Failure - EF has been 20-25% for the last 5 years.  - S/p Medtronic ICD and Barostim - Echo 1/25: EF < 20%, mod to severely reduced RV (in setting of RSV) - Numerous hospitalizations over past 18 months  - NYHA III. Volume overloaded on exam and by OptiVol.   - She will use Furoscix  this afternoon and on 08/22 (does not diurese well with metolazone ). Take an extra 60 mEq KCL with furoscix  - Continue torsemide  100 mg bid + 40 KCL BID (will take 100 mg Torsemide  X 1 on Furoscix  days - Restart Farxiga  10 mg daily. - Continue digoxin  0.0625 mg daily (recently reduced d/t elevated level) - Continue ivabradine  7.5 mg bid. - Continue spironolactone  25 mg  daily.  - Not a candidate for advanced therapies due to severity of lung disease.  - CMET/BNP today  2. Chronic Hypoxic Respiratory Failure/COPD  - Hx frequent admissions for AE COPD + A/C HFrEF.  - No longer on oxygen. - Continue inhalers. - Diuresis as above   3. CKD Stage IIIb  - Creatinine baseline ~ 1.4-1.7 - Restart Farxiga  - Labs today   4. PAF  - Regular on exam today. No episodes on device check. - Continue Eliquis  2.5 mg BID, reduced dose (weight/creatinine).   5. CAD - History of CABG x 2 2019 - s/p DES ostial ramus extending to left main (2020) - No s/s angina - Continue atorvastatin  80 mg daily - No ASA with need for AC   6. H/o SCLC - Completed treatment 2015.  - CT chest (5/24) soft tissue thickening along mediastinum but no discrete mass    7. H/o Renal Infarct 2022 - On Eliquis . No bleeding issues.  8. GOC/FTT - Dr. Bensimhon previously had a long discussion about role of Hospice Care for support to prevent suffering if she continues to deteriorate -> she is not interested in this - She is DNR/DNI  Will look into having meds transferred to [facility] for home delivery.   Follow up 1 week with APP to reassess volume  Brittany [name] N, PA-C 07/30/24

## 2024-07-31 ENCOUNTER — Telehealth: Payer: Self-pay

## 2024-07-31 ENCOUNTER — Inpatient Hospital Stay: Admitting: Family Medicine

## 2024-07-31 NOTE — Telephone Encounter (Signed)
 Received fax from [employer] requesting a signature from Dr. Annella. Received signed order from Dr. Annella. Faxed to [employer] at [phone]. Received fax confirmation. NFN.

## 2024-07-31 NOTE — Telephone Encounter (Signed)
 Both providers have approved,patient is scheduled for [email or web address]

## 2024-07-31 NOTE — Telephone Encounter (Signed)
 Okay with me

## 2024-08-05 ENCOUNTER — Inpatient Hospital Stay: Admitting: Family Medicine

## 2024-08-05 ENCOUNTER — Ambulatory Visit: Attending: Cardiology

## 2024-08-05 DIAGNOSIS — Z95810 Presence of automatic (implantable) cardiac defibrillator: Secondary | ICD-10-CM

## 2024-08-05 DIAGNOSIS — I5022 Chronic systolic (congestive) heart failure: Secondary | ICD-10-CM

## 2024-08-05 NOTE — Progress Notes (Signed)
 EPIC Encounter for ICM Monitoring  Patient Name: Brittany [name]  is a 63 y.o. female Date: 08/05/2024 Primary Care Physican: Tanda Bleacher, MD Primary Cardiologist: Bensimhon Electrophysiologist: Cindie 01/03/2024 Weight: 93-94 lbs  01/22/2024 Weight: 91 lbs (lowest weight 84 lbs) 04/18/2024 Office Weight:  99 lbs 05/27/2024 Weight: 87 lbs 07/14/2024 Weight: 89 lbs   Spoke with patient and heart failure questions reviewed.  Transmission results reviewed.  Pt asymptomatic for fluid accumulation and denies any fluid levels.   Optivol Thoracic impedance suggesting fluid levels trending close to baseline since 8/19.    Prescribed: Torsemide  20 mg take 5 tablets (100 mg total) by mouth two times a day.  Potassium 20 mEq packet take 60 mEq total by mouth twice day with Furoscix  and extra 40 mEq when using Metolazone .   Spironolactone  50 mg take 1 tablet (50 mg total) by mouth daily Furosemide  (Furoscix ) 80 mg/10 ml inject into skin daily    Labs: 07/30/2024 Creatinine 1.34, BUN 29, Potassium 3.1, Sodium 140, GFR 45, BNP 1,826.2 07/20/2024 Creatinine 1.53, BUN 71, Potassium 3.2, Sodium 139, GFR 38  07/19/2024 Creatinine 1.57, BUN 62, Potassium 3.6, Sodium 135, GFR 37  07/18/2024 Creatinine 1.43, BUN 49, Potassium 4.3, Sodium 143, GFR 41  07/17/2024 Creatinine 1.27, BUN 36, Potassium 4.2, Sodium 138, GFR 48 07/16/2024 Creatinine 1.39, BUN 23, Potassium 4.4, Sodium 137, GFR 43  07/15/2024 Creatinine 0.79, BUN 14, Potassium 2.2, Sodium 146, GFR >60, BNP 1,303.1 A complete set of results can be found in Results Review.   Recommendations:  No changes and encouraged to call if experiencing any fluid symptoms.   Follow-up plan: ICM clinic phone appointment on 08/18/2024 to recheck fluid levels.   91 day device clinic remote transmission 09/29/2024.     EP/Cardiology Office Visits:  08/07/2024 with HF Clinic.  Recall 07/26/2024 with [name], PA   Copy of ICM check sent to Dr. Cindie.      3 month ICM trend: 08/05/2024.    12-14 Month ICM trend:     [name], RN 08/05/2024 7:19 AM

## 2024-08-06 ENCOUNTER — Telehealth (HOSPITAL_COMMUNITY): Payer: Self-pay

## 2024-08-06 ENCOUNTER — Other Ambulatory Visit: Payer: Self-pay

## 2024-08-06 ENCOUNTER — Other Ambulatory Visit (HOSPITAL_COMMUNITY): Payer: Self-pay

## 2024-08-06 MED ORDER — POTASSIUM CHLORIDE 20 MEQ PO PACK
80.0000 meq | PACK | Freq: Two times a day (BID) | ORAL | 2 refills | Status: DC
Start: 2024-08-06 — End: 2024-10-22
  Filled 2024-08-06: qty 360, 45d supply, fill #0

## 2024-08-06 NOTE — Telephone Encounter (Signed)
 Called to confirm/remind patient of their appointment at the [facility] on 08/07/24.   Appointment:   [x] Confirmed  [] Left mess   [] No answer/No voice mail  [] VM Full/unable to leave message  [] Phone not in service  Patient reminded to bring all medications and/or complete list.  Confirmed patient has transportation. Gave directions, instructed to utilize valet parking.

## 2024-08-06 NOTE — Progress Notes (Incomplete)
 Advanced Heart Failure Clinic Note  PCP:  Tanda Bleacher, MD  Oncology: Dr Gatha  HF Cardiologist: Dr Cherrie  HPI: Brittany [name]  is a 63 y.o. 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 Underwent emergent cath 02/03/18 showed LAD 100% stenosed, LCx 95% stenosed. Taken for emergent CABG 02/03/18. Required impella post op. Hospital course complicated by cardiogenic shock, HCAP, A fib, respiratory failure, and swallowing issues. She was discharged to SNF. Discharge weight 103 pounds.    In 2019 had multiple hospitalizations for HF and pleural effusion. Underwent pleurodesis at [facility].   Admitted 3/20 with NSTEMI and HF. Received DES to ostial ramus into distal left main based on cath below and underwent diuresis. Meds adjusted as tolerated. Echo with EF 25-30%.    Admitted twice in 2023 with low output requiring milrinone . Barostim placed.   Multiple CHF and AECOPD admission in May, August, October of 2024 and Janurary 2025. Echo 1/25 EF  < 20%, G2DD, RV moderately to severely reduced. She remained DNR.  Admitted 3/25 for CHF and AECOPD. Readmitted 4/25 for CHF/COPD. Was hypotensive. Moved to ICU. Required DBA and IV diuresis. Refused [facility] services.   She was readmitted earlier this month with acute on chronic hypoxic respiratory failure 2/2 AECP and acute on chronic CHF. She required inotrope support with DBA which was wean off prior to discharge.   Remote ICD interrogation 08/18 indicated fluid accumulation since 08/12. We attempted to arrange for Furoscix  08/19 but she was unable to get to clinic to pick up the kit. She did use 5 mg metolazone .  Today she returns for AHF follow up, volume  overloaded last week in clinic and was instructed to use furoscix  x1. Overall feeling ***. Denies palpitations, CP, dizziness, edema, or PND/Orthopnea. *** SOB. Appetite ok. No fever or chills. Weight at home *** pounds. Taking all medications. Denies ETOH, tobacco or drug use.    Past Medical History:  Diagnosis Date   Acute respiratory failure (HCC) 05/05/2018   Acute systolic congestive heart failure (HCC) 02/03/2018   AICD (automatic cardioverter/defibrillator) present    Allergy    Anxiety    Asthma    DM2 (diabetes mellitus, type 2) (HCC) 10/20/2020   Hypertension    PAF (paroxysmal atrial fibrillation) (HCC)    Presence of permanent cardiac pacemaker    Prophylactic measure 08/03/14-08/19/14   Prophyl. 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   LIMA to LAD, SVG to D1, SVG to OM1, EVH via right thigh with implantation of Impella LD LVAD via direct aortic approach   Small cell lung cancer (HCC) 03/16/2014   Past Surgical History:  Procedure Laterality Date   BRONCHIAL BRUSHINGS  10/25/2020   Procedure: BRONCHIAL BRUSHINGS;  Surgeon: Shelah Lamar RAMAN, MD;  Location: [facility] ENDOSCOPY;  Service: Pulmonary;;   BRONCHIAL NEEDLE ASPIRATION BIOPSY  10/25/2020   Procedure: BRONCHIAL NEEDLE ASPIRATION BIOPSIES;  Surgeon: Shelah Lamar RAMAN, MD;  Location: [facility] ENDOSCOPY;  Service: Pulmonary;;   CARDIAC DEFIBRILLATOR PLACEMENT  08/15/2018   MDT Visia AF MRI VR ICD implanted by Dr Zulma for primary prevention of sudden   CESAREAN SECTION     CORONARY ARTERY BYPASS GRAFT N/A 02/03/2018  Procedure: CORONARY ARTERY BYPASS GRAFTING (CABG);  Surgeon: Dusty Sudie DEL, MD;  Location: [facility] OR;  Service: Open Heart Surgery;  Laterality: N/A;  Time 3 using left internal mammary artery and endoscopically harvested right saphenous vein   CORONARY BALLOON ANGIOPLASTY N/A 02/03/2018   Procedure: CORONARY BALLOON ANGIOPLASTY;  Surgeon: Swaziland, Peter M, MD;  Location: [facility] INVASIVE CV LAB;  Service:  Cardiovascular;  Laterality: N/A;   CORONARY STENT INTERVENTION N/A 02/12/2019   Procedure: CORONARY STENT INTERVENTION;  Surgeon: Darron Deatrice LABOR, MD;  Location: [facility] INVASIVE CV LAB;  Service: Cardiovascular;  Laterality: N/A;   CORONARY/GRAFT ACUTE MI REVASCULARIZATION N/A 02/03/2018   Procedure: Coronary/Graft Acute MI Revascularization;  Surgeon: Swaziland, Peter M, MD;  Location: [facility] INVASIVE CV LAB;  Service: Cardiovascular;  Laterality: N/A;   ENDOBRONCHIAL ULTRASOUND N/A 10/25/2020   Procedure: ENDOBRONCHIAL ULTRASOUND;  Surgeon: Shelah Lamar RAMAN, MD;  Location: [facility] ENDOSCOPY;  Service: Pulmonary;  Laterality: N/A;   FLEXIBLE BRONCHOSCOPY  10/25/2020   Procedure: FLEXIBLE BRONCHOSCOPY;  Surgeon: Shelah Lamar RAMAN, MD;  Location: [facility] ENDOSCOPY;  Service: Pulmonary;;   IABP INSERTION N/A 02/03/2018   Procedure: IABP Insertion;  Surgeon: Swaziland, Peter M, MD;  Location: [facility] INVASIVE CV LAB;  Service: Cardiovascular;  Laterality: N/A;   INTRAOPERATIVE TRANSESOPHAGEAL ECHOCARDIOGRAM N/A 02/03/2018   Procedure: INTRAOPERATIVE TRANSESOPHAGEAL ECHOCARDIOGRAM;  Surgeon: Dusty Sudie DEL, MD;  Location[facility] OR;  Service: Open Heart Surgery;  Laterality: N/A;   LEFT HEART CATH AND CORONARY ANGIOGRAPHY N/A 02/03/2018   Procedure: LEFT HEART CATH AND CORONARY ANGIOGRAPHY;  Surgeon: Swaziland, Peter M, MD;  Location: [facility] INVASIVE CV LAB;  Service: Cardiovascular;  Laterality: N/A;   LEFT HEART CATH AND CORS/GRAFTS ANGIOGRAPHY N/A 02/12/2019   Procedure: LEFT HEART CATH AND CORS/GRAFTS ANGIOGRAPHY;  Surgeon: Darron Deatrice LABOR, MD;  Location: [facility] INVASIVE CV LAB;  Service: Cardiovascular;  Laterality: N/A;   MEDIASTINOSCOPY N/A 03/11/2014   Procedure: MEDIASTINOSCOPY;  Surgeon: Elspeth JAYSON Millers, MD;  Location: [facility] OR;  Service: Thoracic;  Laterality: N/A;   PLACEMENT OF IMPELLA LEFT VENTRICULAR ASSIST DEVICE  02/03/2018   Procedure: PLACEMENT OF IMPELLA LEFT VENTRICULAR ASSIST DEVICE LD;  Surgeon: Dusty Sudie DEL, MD;   Location: [facility] OR;  Service: Open Heart Surgery;;   REMOVAL OF IMPELLA LEFT VENTRICULAR ASSIST DEVICE N/A 02/08/2018   Procedure: REMOVAL OF IMPELLA LEFT VENTRICULAR ASSIST DEVICE;  Surgeon: Dusty Sudie DEL, MD;  Location: [facility] OR;  Service: Open Heart Surgery;  Laterality: N/A;   RIGHT HEART CATH N/A 02/03/2018   Procedure: RIGHT HEART CATH;  Surgeon: Swaziland, Peter M, MD;  Location: [facility] INVASIVE CV LAB;  Service: Cardiovascular;  Laterality: N/A;   RIGHT HEART CATH N/A 05/09/2018   Procedure: RIGHT HEART CATH;  Surgeon: Cherrie Toribio SAUNDERS, MD;  Location: [facility] INVASIVE CV LAB;  Service: Cardiovascular;  Laterality: N/A;   TEE WITHOUT CARDIOVERSION N/A 02/08/2018   Procedure: TRANSESOPHAGEAL ECHOCARDIOGRAM (TEE);  Surgeon: Dusty Sudie DEL, MD;  Location: [facility] OR;  Service: Open Heart Surgery;  Laterality: N/A;   TUBAL LIGATION     VIDEO BRONCHOSCOPY WITH ENDOBRONCHIAL ULTRASOUND N/A 03/11/2014   Procedure: VIDEO BRONCHOSCOPY WITH ENDOBRONCHIAL ULTRASOUND;  Surgeon: Elspeth JAYSON Millers, MD;  Location: [facility] OR;  Service: Thoracic;  Laterality: N/A;   Current Outpatient Medications  Medication Sig Dispense Refill   albuterol  (VENTOLIN  HFA) 108 (90 Base) MCG/ACT inhaler Inhale 2 puffs into the lungs every 6 (six) hours as needed for wheezing or shortness of breath. 8.5 g 1   apixaban  (ELIQUIS ) 2.5 MG  TABS tablet Take 1 tablet (2.5 mg total) by mouth 2 (two) times daily. 180 tablet 1   atorvastatin  (LIPITOR ) 80 MG tablet Take 1 tablet (80 mg total) by mouth daily. 90 tablet 0   cyclobenzaprine  (FLEXERIL ) 10 MG tablet Take 1 tablet (10 mg total) by mouth 3 (three) times daily as needed for muscle spasms. 60 tablet 3   dapagliflozin  propanediol (FARXIGA ) 10 MG TABS tablet Take 1 tablet (10 mg total) by mouth daily. 90 tablet 3   digoxin  62.5 MCG TABS Take 0.0625 mg by mouth daily. 30 tablet 1   Furosemide  (FUROSCIX ) 80 MG/10ML CTKT Inject 80 mg into the skin daily. 3 each 0   hydrOXYzine  (VISTARIL ) 25 MG capsule TAKE  1 CAPSULE (25 MG TOTAL) BY MOUTH DAILY AS NEEDED. 90 capsule 1   ipratropium-albuterol  (DUONEB) 0.5-2.5 (3) MG/3ML SOLN TAKE 3 MLS BY NEBULIZATION EVERY 4 (FOUR) HOURS AS NEEDED (FOR SHORTNESS OF BREATH). 180 mL 3   ivabradine  (CORLANOR ) 7.5 MG TABS tablet TAKE 1 TABLET BY MOUTH TWICE A DAY WITH FOOD 120 tablet 1   megestrol  (MEGACE ) 20 MG tablet Take 1 tablet (20 mg total) by mouth 2 (two) times daily. 60 tablet 1   metolazone  (ZAROXOLYN ) 5 MG tablet Take 1 tablet (5 mg total) by mouth as needed (as instructed by Advanced HF clinic). (Patient taking differently: Take 5 mg by mouth See admin instructions. Take 1 tablet (5mg ) by mouth once a week, as needed for fluid retention.) 10 tablet 3   Multiple Vitamin (MULTIVITAMIN WITH MINERALS) TABS tablet Take 1 tablet by mouth daily.     nitroGLYCERIN  (NITROSTAT ) 0.4 MG SL tablet Place 1 tablet (0.4 mg total) under the tongue every 5 (five) minutes as needed for chest pain. 90 tablet 0   Nutritional Supplements (ENSURE ORIGINAL) LIQD Take 1 Bottle by mouth 2 (two) times daily as needed (poor appetite).     pantoprazole  (PROTONIX ) 40 MG tablet Take 1 tablet (40 mg total) by mouth daily at 6 (six) AM. 5 tablet 0   potassium chloride  (KLOR-CON ) 20 MEQ packet TAKE 40 MEQ TOTAL TWICE A DAY EXCEPT ON METOLAZONE  DAYS OF TUESDAYS TAKE (40 MEQ) 3 TIMES PER DAY. 360 packet 2   spironolactone  (ALDACTONE ) 25 MG tablet Take 1 tablet (25 mg total) by mouth daily. 30 tablet 2   torsemide  (DEMADEX ) 20 MG tablet Take 5 tablets (100 mg total) by mouth 2 (two) times daily.     No current facility-administered medications for this visit.    Allergies:   Lactose intolerance (gi) and Codeine   Social History:  The patient  reports that she quit smoking about 10 years ago. Her smoking use included cigarettes. She started smoking about 30 years ago. She has a 20 pack-year smoking history. She has never used smokeless tobacco. She reports current alcohol use of about 5.0  standard drinks of alcohol per week. She reports current drug use. Drug: Marijuana.   Family History:  The patient's family history includes Cancer in her maternal grandmother; Diabetes in her paternal grandmother; Heart attack in her mother; Heart disease in her mother; Hypertension in her maternal grandmother and mother.   ROS:  Please see the history of present illness.   All other systems are personally reviewed and negative.    Wt Readings from Last 3 Encounters:  07/30/24 40.3 kg (88 lb 12.8 oz)  07/20/24 36.3 kg (80 lb 0.4 oz)  06/09/24 40.6 kg (89 lb 9.6 oz)   There were no  vitals taken for this visit.  Physical Exam General:  *** appearing.  No respiratory difficulty Neck: JVD *** cm.  Cor: Regular rate & rhythm. No murmurs. Lungs: clear Extremities: no edema  Neuro: alert & oriented x 3. Affect pleasant.   Device interrogation (personally reviewed): OptiVol up and down, trending back up since 08/12, thoracic impedance below threshold, no AT/AF or VT ***  Assessment & Plan: 1. End-stage Chronic Systolic Heart Failure - EF has been 20-25% for the last 5 years.  - S/p Medtronic ICD and Barostim - Echo 1/25: EF < 20%, mod to severely reduced RV (in setting of RSV) - Numerous hospitalizations over past 18 months  - NYHA III. Volume overloaded on exam and by OptiVol.   - She will use Furoscix  this afternoon and on 08/22 (does not diurese well with metolazone ). Take an extra 60 mEq KCL with furoscix  *** - Continue torsemide  100 mg bid + 40 KCL BID (will take 100 mg Torsemide  X 1 on Furoscix  days - Restart Farxiga  10 mg daily. - Continue digoxin  0.0625 mg daily (recently reduced d/t elevated level) - Continue ivabradine  7.5 mg bid. - Continue spironolactone  25 mg daily.  - Not a candidate for advanced therapies due to severity of lung disease.  - CMET/BNP today  2. Chronic Hypoxic Respiratory Failure/COPD  - Hx frequent admissions for AE COPD + A/C HFrEF.  - No longer on  oxygen. - Continue inhalers. - Diuresis as above   3. CKD Stage IIIb  - Creatinine baseline ~ 1.4-1.7 - Restart Farxiga  - Labs today   4. PAF  - Regular on exam today. No episodes on device check. - Continue Eliquis  2.5 mg BID, reduced dose (weight/creatinine).   5. CAD - History of CABG x 2 2019 - s/p DES ostial ramus extending to left main (2020) - No s/s angina - Continue atorvastatin  80 mg daily - No ASA with need for AC   6. H/o SCLC - Completed treatment 2015.  - CT chest (5/24) soft tissue thickening along mediastinum but no discrete mass    7. H/o Renal Infarct 2022 - On Eliquis . No bleeding issues.  8. GOC/FTT - Dr. Bensimhon previously had a long discussion about role of Hospice Care for support to prevent suffering if she continues to deteriorate -> she is not interested in this - She is DNR/DNI  Will look into having meds transferred to [facility] for home delivery. ***  Follow up 1 week with APP to reassess volume  Beckey LITTIE Coe, NP 08/06/24

## 2024-08-07 ENCOUNTER — Inpatient Hospital Stay (HOSPITAL_COMMUNITY)
Admission: RE | Admit: 2024-08-07 | Discharge: 2024-08-07 | Disposition: A | Discharge status: Home / Self Care | Source: Ambulatory Visit

## 2024-08-12 ENCOUNTER — Encounter: Payer: Self-pay | Admitting: Internal Medicine

## 2024-08-12 ENCOUNTER — Other Ambulatory Visit (HOSPITAL_COMMUNITY): Payer: Self-pay

## 2024-08-13 ENCOUNTER — Other Ambulatory Visit (HOSPITAL_COMMUNITY): Payer: Self-pay

## 2024-08-14 ENCOUNTER — Ambulatory Visit (INDEPENDENT_AMBULATORY_CARE_PROVIDER_SITE_OTHER): Admitting: Family Medicine

## 2024-08-14 ENCOUNTER — Other Ambulatory Visit (HOSPITAL_COMMUNITY): Payer: Self-pay

## 2024-08-14 ENCOUNTER — Encounter: Payer: Self-pay | Admitting: Family Medicine

## 2024-08-14 ENCOUNTER — Other Ambulatory Visit: Payer: Self-pay | Admitting: Pharmacy Technician

## 2024-08-14 ENCOUNTER — Other Ambulatory Visit: Payer: Self-pay

## 2024-08-14 VITALS — BP 92/58 | HR 83 | Ht 59.5 in | Wt 88.6 lb

## 2024-08-14 DIAGNOSIS — N184 Chronic kidney disease, stage 4 (severe): Secondary | ICD-10-CM | POA: Diagnosis not present

## 2024-08-14 DIAGNOSIS — J42 Unspecified chronic bronchitis: Secondary | ICD-10-CM | POA: Diagnosis not present

## 2024-08-14 DIAGNOSIS — I5022 Chronic systolic (congestive) heart failure: Secondary | ICD-10-CM | POA: Diagnosis not present

## 2024-08-14 DIAGNOSIS — Z09 Encounter for follow-up examination after completed treatment for conditions other than malignant neoplasm: Secondary | ICD-10-CM

## 2024-08-14 NOTE — Progress Notes (Signed)
 Specialty Pharmacy Refill Coordination Note  Brittany [name]  is a 63 y.o. female contacted today regarding refills of specialty medication(s) Furosemide  (Furoscix )   Patient requested Delivery   Delivery date: 08/18/24   Verified address: [facility] [address] [place] [address]   Medication will be filled on 08/18/2024.  Disenrolling pt, no refills remaining, therapy complete.

## 2024-08-14 NOTE — Progress Notes (Signed)
 Established Patient Office Visit  Subjective    Patient ID: Brittany [name] , female    DOB: [birth year]  Age: 63 y.o. MRN: [id number]  CC:  Chief Complaint  Patient presents with   Hospitalization Follow-up    HPI Brittany [name]  presents for routine hospital discharge follow up where she was admitted with acute respiratory failure. She reports that she is much improved and now is without supplemental oxygen.    Outpatient Encounter Medications as of 08/14/2024  Medication Sig   albuterol  (VENTOLIN  HFA) 108 (90 Base) MCG/ACT inhaler Inhale 2 puffs into the lungs every 6 (six) hours as needed for wheezing or shortness of breath.   apixaban  (ELIQUIS ) 2.5 MG TABS tablet Take 1 tablet (2.5 mg total) by mouth 2 (two) times daily.   atorvastatin  (LIPITOR ) 80 MG tablet Take 1 tablet (80 mg total) by mouth daily.   cyclobenzaprine  (FLEXERIL ) 10 MG tablet Take 1 tablet (10 mg total) by mouth 3 (three) times daily as needed for muscle spasms.   digoxin  62.5 MCG TABS Take 0.0625 mg by mouth daily.   Furosemide  (FUROSCIX ) 80 MG/10ML CTKT Inject 80 mg into the skin daily.   hydrOXYzine  (VISTARIL ) 25 MG capsule TAKE 1 CAPSULE (25 MG TOTAL) BY MOUTH DAILY AS NEEDED.   ivabradine  (CORLANOR ) 7.5 MG TABS tablet TAKE 1 TABLET BY MOUTH TWICE A DAY WITH FOOD   megestrol  (MEGACE ) 20 MG tablet Take 1 tablet (20 mg total) by mouth 2 (two) times daily.   metolazone  (ZAROXOLYN ) 5 MG tablet Take 1 tablet (5 mg total) by mouth as needed (as instructed by [facility]).   Multiple Vitamin (MULTIVITAMIN WITH MINERALS) TABS tablet Take 1 tablet by mouth daily.   nitroGLYCERIN  (NITROSTAT ) 0.4 MG SL tablet Place 1 tablet (0.4 mg total) under the tongue every 5 (five) minutes as needed for chest pain.   Nutritional Supplements (ENSURE ORIGINAL) LIQD Take 1 Bottle by mouth 2 (two) times daily as needed (poor appetite). (Patient taking differently: Take 1 Bottle by mouth 2 (two) times daily as needed  (poor appetite). Patient only taking 1 daily)   potassium chloride  (KLOR-CON ) 20 MEQ packet Take 80 mEq by mouth 2 (two) times daily. TAKE 80 MEQ TOTAL TWICE A DAY EXCEPT ON METOLAZONE  DAYS OF TUESDAYS TAKE (100 MEQ) 3 TIMES PER DAY.   spironolactone  (ALDACTONE ) 25 MG tablet Take 1 tablet (25 mg total) by mouth daily.   torsemide  (DEMADEX ) 20 MG tablet Take 5 tablets (100 mg total) by mouth 2 (two) times daily.   dapagliflozin  propanediol (FARXIGA ) 10 MG TABS tablet Take 1 tablet (10 mg total) by mouth daily. (Patient not taking: Reported on 08/14/2024)   ipratropium-albuterol  (DUONEB) 0.5-2.5 (3) MG/3ML SOLN TAKE 3 MLS BY NEBULIZATION EVERY 4 (FOUR) HOURS AS NEEDED (FOR SHORTNESS OF BREATH).   pantoprazole  (PROTONIX ) 40 MG tablet Take 1 tablet (40 mg total) by mouth daily at 6 (six) AM. (Patient not taking: Reported on 08/14/2024)   No facility-administered encounter medications on file as of 08/14/2024.    Past Medical History:  Diagnosis Date   Acute respiratory failure (HCC) 05/05/2018   Acute systolic congestive heart failure (HCC) 02/03/2018   AICD (automatic cardioverter/defibrillator) present    Allergy    Anxiety    Asthma    DM2 (diabetes mellitus, type 2) (HCC) 10/20/2020   Hypertension    PAF (paroxysmal atrial fibrillation) (HCC)    Presence of permanent cardiac pacemaker    Prophylactic measure 08/03/14-08/19/14   Prophyl. 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   LIMA to LAD, SVG to D1, SVG to OM1, EVH via right thigh with implantation of Impella LD LVAD via direct aortic approach   Small cell lung cancer (HCC) 03/16/2014    Past Surgical History:  Procedure Laterality Date   BRONCHIAL BRUSHINGS  10/25/2020   Procedure: BRONCHIAL BRUSHINGS;  Surgeon: Shelah Lamar RAMAN, MD;  Location: [facility] ENDOSCOPY;  Service: Pulmonary;;   BRONCHIAL NEEDLE ASPIRATION BIOPSY  10/25/2020   Procedure: BRONCHIAL NEEDLE ASPIRATION BIOPSIES;  Surgeon: Shelah Lamar RAMAN, MD;  Location:  [facility] ENDOSCOPY;  Service: Pulmonary;;   CARDIAC DEFIBRILLATOR PLACEMENT  08/15/2018   MDT Visia AF MRI VR ICD implanted by Dr Zulma for primary prevention of sudden   CESAREAN SECTION     CORONARY ARTERY BYPASS GRAFT N/A 02/03/2018   Procedure: CORONARY ARTERY BYPASS GRAFTING (CABG);  Surgeon: Dusty Sudie DEL, MD;  Location: [facility] OR;  Service: Open Heart Surgery;  Laterality: N/A;  Time 3 using left internal mammary artery and endoscopically harvested right saphenous vein   CORONARY BALLOON ANGIOPLASTY N/A 02/03/2018   Procedure: CORONARY BALLOON ANGIOPLASTY;  Surgeon: Swaziland, Peter M, MD;  Location: [facility] INVASIVE CV LAB;  Service: Cardiovascular;  Laterality: N/A;   CORONARY STENT INTERVENTION N/A 02/12/2019   Procedure: CORONARY STENT INTERVENTION;  Surgeon: Darron Deatrice LABOR, MD;  Location: [facility] INVASIVE CV LAB;  Service: Cardiovascular;  Laterality: N/A;   CORONARY/GRAFT ACUTE MI REVASCULARIZATION N/A 02/03/2018   Procedure: Coronary/Graft Acute MI Revascularization;  Surgeon: Swaziland, Peter M, MD;  Location: [facility] INVASIVE CV LAB;  Service: Cardiovascular;  Laterality: N/A;   ENDOBRONCHIAL ULTRASOUND N/A 10/25/2020   Procedure: ENDOBRONCHIAL ULTRASOUND;  Surgeon: Shelah Lamar RAMAN, MD;  Location: [facility] ENDOSCOPY;  Service: Pulmonary;  Laterality: N/A;   FLEXIBLE BRONCHOSCOPY  10/25/2020   Procedure: FLEXIBLE BRONCHOSCOPY;  Surgeon: Shelah Lamar RAMAN, MD;  Location: [facility] ENDOSCOPY;  Service: Pulmonary;;   IABP INSERTION N/A 02/03/2018   Procedure: IABP Insertion;  Surgeon: Swaziland, Peter M, MD;  Location: [facility] INVASIVE CV LAB;  Service: Cardiovascular;  Laterality: N/A;   INTRAOPERATIVE TRANSESOPHAGEAL ECHOCARDIOGRAM N/A 02/03/2018   Procedure: INTRAOPERATIVE TRANSESOPHAGEAL ECHOCARDIOGRAM;  Surgeon: Dusty Sudie DEL, MD;  Location: [facility] OR;  Service: Open Heart Surgery;  Laterality: N/A;   LEFT HEART CATH AND CORONARY ANGIOGRAPHY N/A 02/03/2018   Procedure: LEFT HEART CATH AND CORONARY ANGIOGRAPHY;  Surgeon: Swaziland,  Peter M, MD;  Location: [facility] INVASIVE CV LAB;  Service: Cardiovascular;  Laterality: N/A;   LEFT HEART CATH AND CORS/GRAFTS ANGIOGRAPHY N/A 02/12/2019   Procedure: LEFT HEART CATH AND CORS/GRAFTS ANGIOGRAPHY;  Surgeon: Darron Deatrice LABOR, MD;  Location: [facility] INVASIVE CV LAB;  Service: Cardiovascular;  Laterality: N/A;   MEDIASTINOSCOPY N/A 03/11/2014   Procedure: MEDIASTINOSCOPY;  Surgeon: Elspeth JAYSON Millers, MD;  Location[facility] OR;  Service: Thoracic;  Laterality: N/A;   PLACEMENT OF IMPELLA LEFT VENTRICULAR ASSIST DEVICE  02/03/2018   Procedure: PLACEMENT OF IMPELLA LEFT VENTRICULAR ASSIST DEVICE LD;  Surgeon: Dusty Sudie DEL, MD;  Location: [facility] OR;  Service: Open Heart Surgery;;   REMOVAL OF IMPELLA LEFT VENTRICULAR ASSIST DEVICE N/A 02/08/2018   Procedure: REMOVAL OF IMPELLA LEFT VENTRICULAR ASSIST DEVICE;  Surgeon: Dusty Sudie DEL, MD;  Location: [facility] OR;  Service: Open Heart Surgery;  Laterality: N/A;   RIGHT HEART CATH N/A 02/03/2018   Procedure: RIGHT HEART CATH;  Surgeon: Swaziland, Peter M, MD;  Location: [facility] INVASIVE CV LAB;  Service: Cardiovascular;  Laterality: N/A;   RIGHT HEART  CATH N/A 05/09/2018   Procedure: RIGHT HEART CATH;  Surgeon: Cherrie Toribio SAUNDERS, MD;  Location: [facility] INVASIVE CV LAB;  Service: Cardiovascular;  Laterality: N/A;   TEE WITHOUT CARDIOVERSION N/A 02/08/2018   Procedure: TRANSESOPHAGEAL ECHOCARDIOGRAM (TEE);  Surgeon: Dusty Sudie DEL, MD;  Location: [facility] OR;  Service: Open Heart Surgery;  Laterality: N/A;   TUBAL LIGATION     VIDEO BRONCHOSCOPY WITH ENDOBRONCHIAL ULTRASOUND N/A 03/11/2014   Procedure: VIDEO BRONCHOSCOPY WITH ENDOBRONCHIAL ULTRASOUND;  Surgeon: Elspeth JAYSON Millers, MD;  Location: [facility] OR;  Service: Thoracic;  Laterality: N/A;    Family History  Problem Relation Age of Onset   Heart disease Mother    Hypertension Mother    Heart attack Mother    Hypertension Maternal Grandmother    Cancer Maternal Grandmother    Diabetes Paternal Grandmother    Stroke Neg Hx      Social History   Socioeconomic History   Marital status: Married    Spouse name: Keven Washington     Number of children: 1   Years of education: Not on file   Highest education level: Not on file  Occupational History   Occupation: disabled  Tobacco Use   Smoking status: Former    Current packs/day: 0.00    Average packs/day: 1 pack/day for 20.0 years (20.0 ttl pk-yrs)    Types: Cigarettes    Start date: 03/13/1994    Quit date: 03/13/2014    Years since quitting: 10.4   Smokeless tobacco: Never  Vaping Use   Vaping status: Never Used  Substance and Sexual Activity   Alcohol use: Yes    Alcohol/week: 5.0 standard drinks of alcohol    Types: 5 Glasses of wine per week    Comment: a few glasses of wine weekly   Drug use: Yes    Types: Marijuana    Comment: medicinal, not daily   Sexual activity: Never  Other Topics Concern   Not on file  Social History Narrative   Not on file   Social Drivers of Health   Financial Resource Strain: Medium Risk (02/11/2021)   Overall Financial Resource Strain (CARDIA)    Difficulty of Paying Living Expenses: Somewhat hard  Food Insecurity: No Food Insecurity (07/22/2024)   Hunger Vital Sign    Worried About Running Out of Food in the Last Year: Never true    Ran Out of Food in the Last Year: Never true  Transportation Needs: No Transportation Needs (07/22/2024)   PRAPARE - Administrator, Civil Service (Medical): No    Lack of Transportation (Non-Medical): No  Physical Activity: Insufficiently Active (08/14/2024)   Exercise Vital Sign    Days of Exercise per Week: 2 days    Minutes of Exercise per Session: 20 min  Stress: Stress Concern Present (02/11/2021)   [employer] of Occupational Health - Occupational Stress Questionnaire    Feeling of Stress : To some extent  Social Connections: Unknown (08/14/2024)   Social Connection and Isolation Panel    Frequency of Communication with Friends and Family: More than three  times a week    Frequency of Social Gatherings with Friends and Family: Never    Attends Religious Services: Not on Insurance claims handler of Clubs or Organizations: No    Attends Banker Meetings: Never    Marital Status: Married  Catering manager Violence: Not At Risk (07/22/2024)   Humiliation, Afraid, Rape, and Kick questionnaire    Fear of Current or  Ex-Partner: No    Emotionally Abused: No    Physically Abused: No    Sexually Abused: No    Review of Systems  All other systems reviewed and are negative.       Objective    BP (!) 92/58   Pulse 83   Ht 4' 11.5 (1.511 m)   Wt 88 lb 9.6 oz (40.2 kg)   SpO2 96%   BMI 17.60 kg/m   Physical Exam Vitals and nursing note reviewed.  Constitutional:      General: She is not in acute distress. Cardiovascular:     Rate and Rhythm: Normal rate and regular rhythm.  Pulmonary:     Effort: Pulmonary effort is normal.     Breath sounds: Normal breath sounds.  Abdominal:     Palpations: Abdomen is soft.     Tenderness: There is no abdominal tenderness.  Neurological:     General: No focal deficit present.     Mental Status: She is alert and oriented to person, place, and time.         Assessment & Plan:   Chronic bronchitis, unspecified chronic bronchitis type (HCC)  Chronic systolic congestive heart failure (HCC)  Chronic kidney disease (CKD), stage IV (severe) [facility])  Hospital discharge follow-up   Patient is much improved. Continue present management  Return in about 3 months (around 11/13/2024) for follow up.   Tanda Raguel SQUIBB, MD

## 2024-08-15 ENCOUNTER — Other Ambulatory Visit: Payer: Self-pay

## 2024-08-15 ENCOUNTER — Telehealth (HOSPITAL_COMMUNITY): Payer: Self-pay

## 2024-08-15 ENCOUNTER — Other Ambulatory Visit (HOSPITAL_COMMUNITY): Payer: Self-pay

## 2024-08-15 NOTE — Telephone Encounter (Signed)
 Called to confirm/remind patient of their appointment at the [facility] on 08/18/24.   Appointment:   [x] Confirmed  [] Left mess   [] No answer/No voice mail  [] VM Full/unable to leave message  [] Phone not in service  Patient reminded to bring all medications and/or complete list.  Confirmed patient has transportation. Gave directions, instructed to utilize valet parking.

## 2024-08-18 ENCOUNTER — Other Ambulatory Visit (HOSPITAL_COMMUNITY): Payer: Self-pay

## 2024-08-18 ENCOUNTER — Ambulatory Visit: Attending: Cardiology

## 2024-08-18 ENCOUNTER — Other Ambulatory Visit: Payer: Self-pay

## 2024-08-18 ENCOUNTER — Inpatient Hospital Stay (HOSPITAL_COMMUNITY)
Admission: RE | Admit: 2024-08-18 | Discharge: 2024-08-18 | Disposition: A | Discharge status: Home / Self Care | Source: Ambulatory Visit

## 2024-08-18 ENCOUNTER — Inpatient Hospital Stay (HOSPITAL_COMMUNITY)
Admission: EM | Admit: 2024-08-18 | Discharge: 2024-08-21 | DRG: 291 | Disposition: A | Discharge status: Home / Self Care | Attending: Internal Medicine | Admitting: Internal Medicine

## 2024-08-18 ENCOUNTER — Encounter (HOSPITAL_COMMUNITY): Payer: Self-pay | Admitting: Internal Medicine

## 2024-08-18 ENCOUNTER — Emergency Department (HOSPITAL_COMMUNITY)

## 2024-08-18 ENCOUNTER — Encounter: Payer: Self-pay | Admitting: Family Medicine

## 2024-08-18 DIAGNOSIS — Z9221 Personal history of antineoplastic chemotherapy: Secondary | ICD-10-CM

## 2024-08-18 DIAGNOSIS — C3490 Malignant neoplasm of unspecified part of unspecified bronchus or lung: Secondary | ICD-10-CM | POA: Diagnosis present

## 2024-08-18 DIAGNOSIS — R627 Adult failure to thrive: Secondary | ICD-10-CM | POA: Diagnosis present

## 2024-08-18 DIAGNOSIS — J9621 Acute and chronic respiratory failure with hypoxia: Secondary | ICD-10-CM | POA: Diagnosis present

## 2024-08-18 DIAGNOSIS — Z91011 Allergy to milk products: Secondary | ICD-10-CM

## 2024-08-18 DIAGNOSIS — I252 Old myocardial infarction: Secondary | ICD-10-CM | POA: Diagnosis not present

## 2024-08-18 DIAGNOSIS — J438 Other emphysema: Secondary | ICD-10-CM | POA: Diagnosis not present

## 2024-08-18 DIAGNOSIS — Z885 Allergy status to narcotic agent status: Secondary | ICD-10-CM

## 2024-08-18 DIAGNOSIS — J449 Chronic obstructive pulmonary disease, unspecified: Secondary | ICD-10-CM | POA: Diagnosis present

## 2024-08-18 DIAGNOSIS — Z833 Family history of diabetes mellitus: Secondary | ICD-10-CM

## 2024-08-18 DIAGNOSIS — N1832 Chronic kidney disease, stage 3b: Secondary | ICD-10-CM | POA: Diagnosis present

## 2024-08-18 DIAGNOSIS — Z87448 Personal history of other diseases of urinary system: Secondary | ICD-10-CM

## 2024-08-18 DIAGNOSIS — Z923 Personal history of irradiation: Secondary | ICD-10-CM

## 2024-08-18 DIAGNOSIS — Z95810 Presence of automatic (implantable) cardiac defibrillator: Secondary | ICD-10-CM | POA: Diagnosis not present

## 2024-08-18 DIAGNOSIS — Z79899 Other long term (current) drug therapy: Secondary | ICD-10-CM

## 2024-08-18 DIAGNOSIS — I5043 Acute on chronic combined systolic (congestive) and diastolic (congestive) heart failure: Secondary | ICD-10-CM | POA: Diagnosis present

## 2024-08-18 DIAGNOSIS — Z8249 Family history of ischemic heart disease and other diseases of the circulatory system: Secondary | ICD-10-CM

## 2024-08-18 DIAGNOSIS — I2582 Chronic total occlusion of coronary artery: Secondary | ICD-10-CM | POA: Diagnosis present

## 2024-08-18 DIAGNOSIS — E1122 Type 2 diabetes mellitus with diabetic chronic kidney disease: Secondary | ICD-10-CM | POA: Diagnosis present

## 2024-08-18 DIAGNOSIS — Z955 Presence of coronary angioplasty implant and graft: Secondary | ICD-10-CM

## 2024-08-18 DIAGNOSIS — I480 Paroxysmal atrial fibrillation: Secondary | ICD-10-CM | POA: Diagnosis present

## 2024-08-18 DIAGNOSIS — E876 Hypokalemia: Secondary | ICD-10-CM | POA: Diagnosis present

## 2024-08-18 DIAGNOSIS — Z87891 Personal history of nicotine dependence: Secondary | ICD-10-CM | POA: Diagnosis not present

## 2024-08-18 DIAGNOSIS — I130 Hypertensive heart and chronic kidney disease with heart failure and stage 1 through stage 4 chronic kidney disease, or unspecified chronic kidney disease: Secondary | ICD-10-CM | POA: Diagnosis present

## 2024-08-18 DIAGNOSIS — J9611 Chronic respiratory failure with hypoxia: Secondary | ICD-10-CM | POA: Diagnosis not present

## 2024-08-18 DIAGNOSIS — I5084 End stage heart failure: Secondary | ICD-10-CM | POA: Diagnosis present

## 2024-08-18 DIAGNOSIS — I429 Cardiomyopathy, unspecified: Secondary | ICD-10-CM | POA: Diagnosis present

## 2024-08-18 DIAGNOSIS — Z66 Do not resuscitate: Secondary | ICD-10-CM | POA: Diagnosis present

## 2024-08-18 DIAGNOSIS — I5041 Acute combined systolic (congestive) and diastolic (congestive) heart failure: Secondary | ICD-10-CM

## 2024-08-18 DIAGNOSIS — I5023 Acute on chronic systolic (congestive) heart failure: Secondary | ICD-10-CM | POA: Diagnosis not present

## 2024-08-18 DIAGNOSIS — R0603 Acute respiratory distress: Secondary | ICD-10-CM | POA: Diagnosis present

## 2024-08-18 DIAGNOSIS — Z7901 Long term (current) use of anticoagulants: Secondary | ICD-10-CM | POA: Diagnosis not present

## 2024-08-18 DIAGNOSIS — I5022 Chronic systolic (congestive) heart failure: Secondary | ICD-10-CM

## 2024-08-18 DIAGNOSIS — N179 Acute kidney failure, unspecified: Secondary | ICD-10-CM | POA: Diagnosis not present

## 2024-08-18 DIAGNOSIS — I2510 Atherosclerotic heart disease of native coronary artery without angina pectoris: Secondary | ICD-10-CM | POA: Diagnosis present

## 2024-08-18 DIAGNOSIS — Z951 Presence of aortocoronary bypass graft: Secondary | ICD-10-CM

## 2024-08-18 DIAGNOSIS — N183 Chronic kidney disease, stage 3 unspecified: Secondary | ICD-10-CM | POA: Diagnosis present

## 2024-08-18 DIAGNOSIS — Z85118 Personal history of other malignant neoplasm of bronchus and lung: Secondary | ICD-10-CM

## 2024-08-18 DIAGNOSIS — Z8701 Personal history of pneumonia (recurrent): Secondary | ICD-10-CM

## 2024-08-18 LAB — CBC WITH DIFFERENTIAL/PLATELET
Abs Immature Granulocytes: 0.02 K/uL (ref 0.00–0.07)
Basophils Absolute: 0 K/uL (ref 0.0–0.1)
Basophils Relative: 1 %
Eosinophils Absolute: 0.2 K/uL (ref 0.0–0.5)
Eosinophils Relative: 4 %
HCT: 52.9 % — ABNORMAL HIGH (ref 36.0–46.0)
Hemoglobin: 16.8 g/dL — ABNORMAL HIGH (ref 12.0–15.0)
Immature Granulocytes: 0 %
Lymphocytes Relative: 13 %
Lymphs Abs: 0.7 K/uL (ref 0.7–4.0)
MCH: 28.3 pg (ref 26.0–34.0)
MCHC: 31.8 g/dL (ref 30.0–36.0)
MCV: 89.2 fL (ref 80.0–100.0)
Monocytes Absolute: 0.5 K/uL (ref 0.1–1.0)
Monocytes Relative: 8 %
Neutro Abs: 4 K/uL (ref 1.7–7.7)
Neutrophils Relative %: 74 %
Platelets: 168 K/uL (ref 150–400)
RBC: 5.93 MIL/uL — ABNORMAL HIGH (ref 3.87–5.11)
RDW: 19.8 % — ABNORMAL HIGH (ref 11.5–15.5)
WBC: 5.4 K/uL (ref 4.0–10.5)
nRBC: 0 % (ref 0.0–0.2)

## 2024-08-18 LAB — BASIC METABOLIC PANEL WITH GFR
Anion gap: 12 (ref 5–15)
BUN: 21 mg/dL (ref 8–23)
CO2: 23 mmol/L (ref 22–32)
Calcium: 9.6 mg/dL (ref 8.9–10.3)
Chloride: 105 mmol/L (ref 98–111)
Creatinine, Ser: 1.28 mg/dL — ABNORMAL HIGH (ref 0.44–1.00)
GFR, Estimated: 47 mL/min — ABNORMAL LOW (ref >60)
Glucose, Bld: 153 mg/dL — ABNORMAL HIGH (ref 70–99)
Potassium: 3.4 mmol/L — ABNORMAL LOW (ref 3.5–5.1)
Sodium: 140 mmol/L (ref 135–145)

## 2024-08-18 LAB — RESPIRATORY PANEL BY PCR

## 2024-08-18 LAB — BRAIN NATRIURETIC PEPTIDE: B Natriuretic Peptide: 1619.7 pg/mL — ABNORMAL HIGH (ref 0.0–100.0)

## 2024-08-18 LAB — DIGOXIN LEVEL: Digoxin Level: 0.6 ng/mL — ABNORMAL LOW (ref 0.8–2.0)

## 2024-08-18 LAB — TROPONIN I (HIGH SENSITIVITY)
Troponin I (High Sensitivity): 37 ng/L — ABNORMAL HIGH (ref <18)
Troponin I (High Sensitivity): 41 ng/L — ABNORMAL HIGH (ref <18)

## 2024-08-18 MED ORDER — CYCLOBENZAPRINE HCL 10 MG PO TABS
10.0000 mg | ORAL_TABLET | Freq: Three times a day (TID) | ORAL | Status: DC | PRN
Start: 2024-08-18 — End: 2024-08-21
  Administered 2024-08-20: 10 mg via ORAL
  Filled 2024-08-18: qty 1

## 2024-08-18 MED ORDER — ALBUTEROL SULFATE HFA 108 (90 BASE) MCG/ACT IN AERS: 2.0000 | INHALATION_SPRAY | Freq: Four times a day (QID) | RESPIRATORY_TRACT | Status: DC | PRN

## 2024-08-18 MED ORDER — DAPAGLIFLOZIN PROPANEDIOL 10 MG PO TABS
10.0000 mg | ORAL_TABLET | Freq: Every day | ORAL | Status: DC
  Administered 2024-08-19 – 2024-08-20 (×2): 10 mg via ORAL
  Filled 2024-08-18 (×2): qty 1

## 2024-08-18 MED ORDER — NITROGLYCERIN 0.4 MG SL SUBL: 0.4000 mg | SUBLINGUAL_TABLET | SUBLINGUAL | Status: DC | PRN

## 2024-08-18 MED ORDER — IPRATROPIUM-ALBUTEROL 0.5-2.5 (3) MG/3ML IN SOLN
3.0000 mL | RESPIRATORY_TRACT | Status: DC | PRN
  Administered 2024-08-19 – 2024-08-21 (×4): 3 mL via RESPIRATORY_TRACT
  Filled 2024-08-18 (×4): qty 3

## 2024-08-18 MED ORDER — ATORVASTATIN CALCIUM 80 MG PO TABS
80.0000 mg | ORAL_TABLET | Freq: Every day | ORAL | Status: DC
  Administered 2024-08-19: 80 mg via ORAL
  Filled 2024-08-18 (×3): qty 1

## 2024-08-18 MED ORDER — IPRATROPIUM-ALBUTEROL 0.5-2.5 (3) MG/3ML IN SOLN
3.0000 mL | Freq: Once | RESPIRATORY_TRACT | Status: AC
  Administered 2024-08-18: 3 mL via RESPIRATORY_TRACT
  Filled 2024-08-18: qty 3

## 2024-08-18 MED ORDER — MEGESTROL ACETATE 20 MG PO TABS
20.0000 mg | ORAL_TABLET | Freq: Two times a day (BID) | ORAL | Status: DC
  Administered 2024-08-19 – 2024-08-21 (×4): 20 mg via ORAL
  Filled 2024-08-18 (×6): qty 1

## 2024-08-18 MED ORDER — DIGOXIN 125 MCG PO TABS
0.0625 mg | ORAL_TABLET | Freq: Every day | ORAL | Status: DC
  Administered 2024-08-19 – 2024-08-20 (×2): 0.0625 mg via ORAL
  Filled 2024-08-18 (×2): qty 1

## 2024-08-18 MED ORDER — FUROSEMIDE 8 MG/ML PO SOLN
40.0000 mg | ORAL | Status: DC
  Filled 2024-08-18 (×2): qty 5

## 2024-08-18 MED ORDER — FUROSEMIDE 10 MG/ML PO SOLN
40.0000 mg | Freq: Once | ORAL | Status: DC
  Filled 2024-08-18: qty 4

## 2024-08-18 MED ORDER — SPIRONOLACTONE 25 MG PO TABS
25.0000 mg | ORAL_TABLET | Freq: Every day | ORAL | Status: DC
  Administered 2024-08-19: 25 mg via ORAL
  Filled 2024-08-18: qty 1

## 2024-08-18 MED ORDER — HYDROXYZINE PAMOATE 25 MG PO CAPS
25.0000 mg | ORAL_CAPSULE | Freq: Every day | ORAL | Status: DC | PRN
Start: 2024-08-18 — End: 2024-08-18

## 2024-08-18 MED ORDER — IVABRADINE HCL 5 MG PO TABS
7.5000 mg | ORAL_TABLET | Freq: Two times a day (BID) | ORAL | Status: DC
  Administered 2024-08-19 – 2024-08-21 (×5): 7.5 mg via ORAL
  Filled 2024-08-18 (×6): qty 1

## 2024-08-18 MED ORDER — ALBUTEROL SULFATE (2.5 MG/3ML) 0.083% IN NEBU
2.5000 mg | INHALATION_SOLUTION | Freq: Four times a day (QID) | RESPIRATORY_TRACT | Status: DC | PRN
  Filled 2024-08-18: qty 3

## 2024-08-18 MED ORDER — FUROSEMIDE 10 MG/ML IJ SOLN
40.0000 mg | Freq: Once | INTRAMUSCULAR | Status: AC
  Administered 2024-08-18: 40 mg via INTRAVENOUS
  Filled 2024-08-18: qty 4

## 2024-08-18 MED ORDER — APIXABAN 2.5 MG PO TABS
2.5000 mg | ORAL_TABLET | Freq: Two times a day (BID) | ORAL | Status: DC
  Administered 2024-08-18 – 2024-08-21 (×6): 2.5 mg via ORAL
  Filled 2024-08-18 (×6): qty 1

## 2024-08-18 MED ORDER — HYDROXYZINE HCL 25 MG PO TABS: 25.0000 mg | ORAL_TABLET | Freq: Every day | ORAL | Status: DC | PRN

## 2024-08-18 MED ORDER — ENSURE PLUS HIGH PROTEIN PO LIQD
1.0000 | Freq: Two times a day (BID) | ORAL | Status: DC | PRN
  Administered 2024-08-19 (×2): 237 mL via ORAL

## 2024-08-18 MED ORDER — METHYLPREDNISOLONE SODIUM SUCC 125 MG IJ SOLR
125.0000 mg | Freq: Once | INTRAMUSCULAR | Status: AC
  Administered 2024-08-18: 125 mg via INTRAVENOUS
  Filled 2024-08-18: qty 2

## 2024-08-18 NOTE — Progress Notes (Unsigned)
 EPIC Encounter for ICM Monitoring  Patient Name: Brittany [name]  is a 63 y.o. female Date: 08/18/2024 Primary Care Physican: Tanda Bleacher, MD Primary Cardiologist: Bensimhon Electrophysiologist: Cindie 01/03/2024 Weight: 93-94 lbs  01/22/2024 Weight: 91 lbs (lowest weight 84 lbs) 04/18/2024 Office Weight:  99 lbs 05/27/2024 Weight: 87 lbs 07/14/2024 Weight: 89 lbs   Spoke with patient and heart failure questions reviewed.  Transmission results reviewed.  Pt she is out of town and feels like she has fluid.  She expecting Furoscix  to be delivered 9/9 and will take a dose.    Optivol Thoracic impedance suggesting possible fluid accumulation 8/22.    Prescribed: Torsemide  20 mg take 5 tablets (100 mg total) by mouth two times a day.  Potassium 20 mEq packet take 60 mEq total by mouth twice day with Furoscix  and extra 40 mEq when using Metolazone .   Spironolactone  50 mg take 1 tablet (50 mg total) by mouth daily Furosemide  (Furoscix ) 80 mg/10 ml inject into skin daily    Labs: 07/30/2024 Creatinine 1.34, BUN 29, Potassium 3.1, Sodium 140, GFR 45, BNP 1,826.2 07/20/2024 Creatinine 1.53, BUN 71, Potassium 3.2, Sodium 139, GFR 38  07/19/2024 Creatinine 1.57, BUN 62, Potassium 3.6, Sodium 135, GFR 37  07/18/2024 Creatinine 1.43, BUN 49, Potassium 4.3, Sodium 143, GFR 41  07/17/2024 Creatinine 1.27, BUN 36, Potassium 4.2, Sodium 138, GFR 48 07/16/2024 Creatinine 1.39, BUN 23, Potassium 4.4, Sodium 137, GFR 43  07/15/2024 Creatinine 0.79, BUN 14, Potassium 2.2, Sodium 146, GFR >60, BNP 1,303.1 A complete set of results can be found in Results Review.   Recommendations:  Will call back on 9/9 when patient returns home.  She will take dose of Furoscix  when she returns home, currently out of town.   Follow-up plan: ICM clinic phone appointment on 08/25/2024 to recheck fluid levels.   91 day device clinic remote transmission 09/29/2024.     EP/Cardiology Office Visits:  08/22/2024 with HF  Clinic.  Recall 07/26/2024 with [name], PA   Copy of ICM check sent to Dr. Cindie.      3 month ICM trend: 08/18/2024.    12-14 Month ICM trend:     [name], RN 08/18/2024 4:04 PM

## 2024-08-18 NOTE — ED Provider Notes (Signed)
 [facility] AT [facility] Provider Note   CSN: [id number] Arrival date & time: 08/18/24  1753     Patient presents with: Shortness of Breath   Brittany [name]  is a 63 y.o. female.    Shortness of Breath  This patient is a 63 year old female, she has a history of congestive heart failure, she is on Eliquis , has had multiple heart attacks in the past, she is also on digoxin  and has an implantable defibrillator pacer but also a sensor for fluid status and was called today by the cardiology clinic to tell her that she was fluid overloaded despite taking her furosemide .  She reports that she has had increasing shortness of breath which became quite severe today.  She denies having any fevers or chills, denies swelling of her legs, states this is similar to what is happened in the past, she is hardly able to speak in 2-3 word sentences.  Paramedics report they had to put her on BiPAP because of increased work of breathing.    Prior to Admission medications   Medication Sig Start Date End Date Taking? Authorizing Provider  albuterol  (VENTOLIN  HFA) 108 (90 Base) MCG/ACT inhaler Inhale 2 puffs into the lungs every 6 (six) hours as needed for wheezing or shortness of breath. 06/20/22   Fairy Frames, MD  apixaban  (ELIQUIS ) 2.5 MG TABS tablet Take 1 tablet (2.5 mg total) by mouth 2 (two) times daily. 07/30/24   Colletta Manuelita Garre, PA-C  atorvastatin  (LIPITOR ) 80 MG tablet Take 1 tablet (80 mg total) by mouth daily. 08/06/23 11/20/24  Nicholas Bar, MD  cyclobenzaprine  (FLEXERIL ) 10 MG tablet Take 1 tablet (10 mg total) by mouth 3 (three) times daily as needed for muscle spasms. 10/11/23   Tanda Bleacher, MD  dapagliflozin  propanediol (FARXIGA ) 10 MG TABS tablet Take 1 tablet (10 mg total) by mouth daily. Patient not taking: Reported on 08/14/2024 07/30/24   Colletta Manuelita Garre, PA-C  digoxin  62.5 MCG TABS Take 0.0625 mg by mouth daily. 07/21/24   Akula,  Vijaya, MD  Furosemide  (FUROSCIX ) 80 MG/10ML CTKT Inject 80 mg into the skin daily. 07/30/24   Colletta Manuelita Garre, PA-C  hydrOXYzine  (VISTARIL ) 25 MG capsule TAKE 1 CAPSULE (25 MG TOTAL) BY MOUTH DAILY AS NEEDED. 07/30/23   Lorren Greig PARAS, NP  ipratropium-albuterol  (DUONEB) 0.5-2.5 (3) MG/3ML SOLN TAKE 3 MLS BY NEBULIZATION EVERY 4 (FOUR) HOURS AS NEEDED (FOR SHORTNESS OF BREATH). 07/08/24   Tanda Bleacher, MD  ivabradine  (CORLANOR ) 7.5 MG TABS tablet TAKE 1 TABLET BY MOUTH TWICE A DAY WITH FOOD 07/08/24   Bensimhon, Toribio SAUNDERS, MD  megestrol  (MEGACE ) 20 MG tablet Take 1 tablet (20 mg total) by mouth 2 (two) times daily. 02/26/24   Tanda Bleacher, MD  metolazone  (ZAROXOLYN ) 5 MG tablet Take 1 tablet (5 mg total) by mouth as needed (as instructed by [facility]). 04/18/24   Bensimhon, Daniel R, MD  Multiple Vitamin (MULTIVITAMIN WITH MINERALS) TABS tablet Take 1 tablet by mouth daily.    [provider]  nitroGLYCERIN  (NITROSTAT ) 0.4 MG SL tablet Place 1 tablet (0.4 mg total) under the tongue every 5 (five) minutes as needed for chest pain. 03/13/24   Lee, [place], NP  Nutritional Supplements (ENSURE ORIGINAL) LIQD Take 1 Bottle by mouth 2 (two) times daily as needed (poor appetite). Patient taking differently: Take 1 Bottle by mouth 2 (two) times daily as needed (poor appetite). Patient only taking 1 daily    [provider]  pantoprazole  (  PROTONIX ) 40 MG tablet Take 1 tablet (40 mg total) by mouth daily at 6 (six) AM. Patient not taking: Reported on 08/14/2024 07/21/24   Cherlyn Labella, MD  potassium chloride  (KLOR-CON ) 20 MEQ packet Take 80 mEq by mouth 2 (two) times daily. TAKE 80 MEQ TOTAL TWICE A DAY EXCEPT ON METOLAZONE  DAYS OF TUESDAYS TAKE (100 MEQ) 3 TIMES PER DAY. 08/06/24   [name], Harlene HERO, FNP  spironolactone  (ALDACTONE ) 25 MG tablet Take 1 tablet (25 mg total) by mouth daily. 07/21/24   Akula, Vijaya, MD  torsemide  (DEMADEX ) 20 MG tablet Take 5 tablets (100 mg total) by  mouth 2 (two) times daily. 07/21/24   Akula, Vijaya, MD    Allergies: Lactose intolerance (gi) and Codeine    Review of Systems  Respiratory:  Positive for shortness of breath.   All other systems reviewed and are negative.   Updated Vital Signs BP 111/73   Pulse 83   Temp (!) 97.4 F (36.3 C) (Oral)   Resp 20   Ht 1.499 m (4' 11)   Wt 40 kg   SpO2 100%   BMI 17.81 kg/m   Physical Exam Vitals and nursing note reviewed.  Constitutional:      General: She is in acute distress.     Appearance: She is ill-appearing.  HENT:     Head: Normocephalic and atraumatic.     Mouth/Throat:     Pharynx: No oropharyngeal exudate.  Eyes:     General: No scleral icterus.       Right eye: No discharge.        Left eye: No discharge.     Conjunctiva/sclera: Conjunctivae normal.     Pupils: Pupils are equal, round, and reactive to light.  Neck:     Thyroid : No thyromegaly.     Vascular: No JVD.  Cardiovascular:     Rate and Rhythm: Normal rate and regular rhythm.     Heart sounds: Normal heart sounds. No murmur heard.    No friction rub. No gallop.  Pulmonary:     Effort: Pulmonary effort is normal. No respiratory distress.     Breath sounds: Wheezing, rhonchi and rales present.  Abdominal:     General: Bowel sounds are normal. There is no distension.     Palpations: Abdomen is soft. There is no mass.     Tenderness: There is no abdominal tenderness.  Musculoskeletal:        General: No tenderness. Normal range of motion.     Cervical back: Normal range of motion and neck supple.     Right lower leg: No edema.     Left lower leg: No edema.  Lymphadenopathy:     Cervical: No cervical adenopathy.  Skin:    General: Skin is warm and dry.     Findings: No erythema or rash.  Neurological:     Mental Status: She is alert.     Coordination: Coordination normal.  Psychiatric:        Behavior: Behavior normal.     (all labs ordered are listed, but only abnormal results are  displayed) Labs Reviewed  BRAIN NATRIURETIC PEPTIDE - Abnormal; Notable for the following components:      Result Value   B Natriuretic Peptide 1,619.7 (*)    All other components within normal limits  CBC WITH DIFFERENTIAL/PLATELET - Abnormal; Notable for the following components:   RBC 5.93 (*)    Hemoglobin 16.8 (*)    HCT 52.9 (*)  RDW 19.8 (*)    All other components within normal limits  DIGOXIN  LEVEL - Abnormal; Notable for the following components:   Digoxin  Level 0.6 (*)    All other components within normal limits  TROPONIN I (HIGH SENSITIVITY) - Abnormal; Notable for the following components:   Troponin I (High Sensitivity) 37 (*)    All other components within normal limits  RESPIRATORY PANEL BY PCR  TROPONIN I (HIGH SENSITIVITY)    EKG: None  Radiology: DG Chest Port 1 View Result Date: 08/18/2024 CLINICAL DATA:  Shortness of breath EXAM: PORTABLE CHEST 1 VIEW COMPARISON:  07/15/2024 FINDINGS: Left AICD remains in place, unchanged. Prior CABG. Cardiomegaly. Stable soft tissue fullness in the right hilum related to post treatment changes as seen on prior CT. No acute confluent opacities or effusions. No acute bony abnormality. IMPRESSION: Cardiomegaly. No active disease. Electronically Signed   By: Franky Crease M.D.   On: 08/18/2024 19:27     .Critical Care  Performed by: Cleotilde Rogue, MD Authorized by: Cleotilde Rogue, MD   Critical care provider statement:    Critical care time (minutes):  45   Critical care time was exclusive of:  Separately billable procedures and treating other patients and teaching time   Critical care was necessary to treat or prevent imminent or life-threatening deterioration of the following conditions:  Respiratory failure and cardiac failure   Critical care was time spent personally by me on the following activities:  Development of treatment plan with patient or surrogate, discussions with consultants, evaluation of patient's response to  treatment, examination of patient, obtaining history from patient or surrogate, review of old charts, re-evaluation of patient's condition, pulse oximetry, ordering and review of radiographic studies, ordering and review of laboratory studies and ordering and performing treatments and interventions   I assumed direction of critical care for this patient from another provider in my specialty: no     Care discussed with: admitting provider   Comments:          Medications Ordered in the ED  ipratropium-albuterol  (DUONEB) 0.5-2.5 (3) MG/3ML nebulizer solution 3 mL (3 mLs Nebulization Given 08/18/24 1809)  methylPREDNISolone  sodium succinate (SOLU-MEDROL ) 125 mg/2 mL injection 125 mg (125 mg Intravenous Given 08/18/24 1825)  furosemide  (LASIX ) injection 40 mg (40 mg Intravenous Given 08/18/24 1844)                                    Medical Decision Making Amount and/or Complexity of Data Reviewed Labs: ordered. Radiology: ordered. ECG/medicine tests: ordered.  Risk Prescription drug management. Decision regarding hospitalization.   Respiratory distress, mild tachycardia, she is not hypoxic but has severe increased work of breathing, she is tachypneic and does much better on BiPAP.  She has a pacer in the left upper chest wall   This patient presents to the ED for concern of likely shortness of breath from fluid overload, this involves an extensive number of treatment options, and is a complaint that carries with it a high risk of complications and morbidity.  The differential diagnosis includes heart failure, less likely to be PE given that she is on Eliquis , consider myocardial infarction, pneumothorax, pneumonia, COVID   Co morbidities / Chronic conditions that complicate the patient evaluation  Severe CHF, known COPD   Additional history obtained:  Additional history obtained from EMR External records from outside source obtained and reviewed including patient has what appears to  be some type of pacer defibrillator device in the left upper chest, echocardiogram with ejection fraction of less than 20% with severe global hypokinesis   Lab Tests:  I Ordered, and personally interpreted labs.  The pertinent results include: BNP elevated over 1500, troponin only minimally elevated in the 30s, elevation in hemoglobin   Imaging Studies ordered:  I ordered imaging studies including chest x-ray I independently visualized and interpreted imaging which showed cardiomegaly I agree with the radiologist interpretation   Cardiac Monitoring: / EKG:  The patient was maintained on a cardiac monitor.  I personally viewed and interpreted the cardiac monitored which showed an underlying rhythm of: EKG shows that the patient is in a likely paced rhythm though she has significant and severe underlying artifact.  It is difficult to tell if there is any ischemia   Problem List / ED Course / Critical interventions / Medication management  The patient has what appears to be severe underlying CHF and will likely need to be admitted to the hospital, she is requiring BiPAP and is critically ill I ordered medication including Lasix , BiPAP, oxygen Reevaluation of the patient after these medicines showed that the patient minimal improvement I have reviewed the patients home medicines and have made adjustments as needed   Consultations Obtained:  I requested consultation with the cardiology team,  and discussed lab and imaging findings as well as pertinent plan - they recommend: admit to cardiology service per Dr. Mona   Social Determinants of Health:  Severe congestive heart failure   Test / Admission - Considered:  Admit to a high level of care.      Final diagnoses:  Respiratory distress  Acute on chronic combined systolic and diastolic congestive heart failure [facility])    ED Discharge Orders     None          Cleotilde Rogue, MD 08/18/24 1932

## 2024-08-18 NOTE — ED Triage Notes (Signed)
 Pt BIB [facility] from home. Pt has pacemaker that also measures fluid in body. Pt got a call today that she had excess fluid on body while also having SHOB. Pt on BiPAP with wet lung sounds.  VS  98% on BiPap

## 2024-08-18 NOTE — Progress Notes (Signed)
   08/18/24 2200  Vitals  Temp 97.8 F (36.6 C)  Temp Source Oral  BP 114/70  MAP (mmHg) 84  BP Location Left Arm  BP Method Automatic  Patient Position (if appropriate) Lying  Pulse Rate 79  ECG Heart Rate 79  Resp 20  Level of Consciousness  Level of Consciousness Alert  MEWS COLOR  MEWS Score Color Green  Oxygen Therapy  SpO2 100 %  O2 Device Nasal Cannula  O2 Flow Rate (L/min) 3 L/min  Pain Assessment  Pain Scale 0-10  Pain Score 0  MEWS Score  MEWS Temp 0  MEWS Systolic 0  MEWS Pulse 0  MEWS RR 0  MEWS LOC 0  MEWS Score 0   Admitted from ED, pt is alert and oriented, denies pain at this time, oriented to room, call bell placed within reach, placed on cardiac monitor, CCMD made aware.

## 2024-08-18 NOTE — ED Notes (Addendum)
 RT notified that patient has a bed assignment sharlon patient with RN . Floor RN notified that the patient is being transported.

## 2024-08-18 NOTE — H&P (Signed)
 ADMISSION HISTORY & PHYSICAL  Patient Name: Brittany [name]  Date of Encounter: 08/18/2024 Primary Care Physician: Tanda Bleacher, MD Cardiologist: None  Chief Complaint   Short of breath  Patient Profile   63 yo female with acute on chronic systolic congestive heart failure  HPI   This is a 63 y.o. female followed by the advanced heart failure clinic with a past medical history significant for PAF, CAD status post emergent CABG in 2019 and DES in 2020, hypertension, history of small cell lung cancer and chronic systolic heart failure with LVEF of around 20%.  She has had multiple hospitalizations related to heart failure, including multiple recent heart failure admissions.  She has refused palliative care services.  She was being managed with Furoscix  for exacerbations due to frequent fluid accumulations noted on remote ICD interrogations.  Review of her OptiVol shows a sinusoidal pattern of recent OptiVol fluid gains including recent episode which she was contacted about today.  She was apparently out of town and was going to try to come back to get her Furoscix  however became substantially short of breath and presented to the emergency department via EMS.  She was placed on BiPAP.  She has been noted to desaturate quickly off of it and cannot speak in more than 2 or 3 word sentences.  Chest x-ray was performed which shows no acute disease.  Cardiology was asked to evaluate for heart failure.  PMHx   Past Medical History:  Diagnosis Date   Acute respiratory failure (HCC) 05/05/2018   Acute systolic congestive heart failure (HCC) 02/03/2018   AICD (automatic cardioverter/defibrillator) present    Allergy    Anxiety    Asthma    DM2 (diabetes mellitus, type 2) (HCC) 10/20/2020   Hypertension    PAF (paroxysmal atrial fibrillation) (HCC)    Presence of permanent cardiac pacemaker    Prophylactic measure 08/03/14-08/19/14   Prophyl. cranial radiation 24 Gy   S/P emergency  CABG x 3 02/03/2018   LIMA to LAD, SVG to D1, SVG to OM1, EVH via right thigh with implantation of Impella LD LVAD via direct aortic approach   Small cell lung cancer (HCC) 03/16/2014    Past Surgical History:  Procedure Laterality Date   BRONCHIAL BRUSHINGS  10/25/2020   Procedure: BRONCHIAL BRUSHINGS;  Surgeon: Shelah Lamar RAMAN, MD;  Location: [facility] ENDOSCOPY;  Service: Pulmonary;;   BRONCHIAL NEEDLE ASPIRATION BIOPSY  10/25/2020   Procedure: BRONCHIAL NEEDLE ASPIRATION BIOPSIES;  Surgeon: Shelah Lamar RAMAN, MD;  Location: [facility] ENDOSCOPY;  Service: Pulmonary;;   CARDIAC DEFIBRILLATOR PLACEMENT  08/15/2018   MDT Visia AF MRI VR ICD implanted by Dr Zulma for primary prevention of sudden   CESAREAN SECTION     CORONARY ARTERY BYPASS GRAFT N/A 02/03/2018   Procedure: CORONARY ARTERY BYPASS GRAFTING (CABG);  Surgeon: Dusty Sudie DEL, MD;  Location: [facility] OR;  Service: Open Heart Surgery;  Laterality: N/A;  Time 3 using left internal mammary artery and endoscopically harvested right saphenous vein   CORONARY BALLOON ANGIOPLASTY N/A 02/03/2018   Procedure: CORONARY BALLOON ANGIOPLASTY;  Surgeon: Swaziland, Peter M, MD;  Location: [facility] INVASIVE CV LAB;  Service: Cardiovascular;  Laterality: N/A;   CORONARY STENT INTERVENTION N/A 02/12/2019   Procedure: CORONARY STENT INTERVENTION;  Surgeon: Darron Deatrice LABOR, MD;  Location: [facility] INVASIVE CV LAB;  Service: Cardiovascular;  Laterality: N/A;   CORONARY/GRAFT ACUTE MI REVASCULARIZATION N/A 02/03/2018   Procedure: Coronary/Graft Acute MI Revascularization;  Surgeon: Swaziland, Peter M, MD;  Location: [facility]  INVASIVE CV LAB;  Service: Cardiovascular;  Laterality: N/A;   ENDOBRONCHIAL ULTRASOUND N/A 10/25/2020   Procedure: ENDOBRONCHIAL ULTRASOUND;  Surgeon: Shelah Lamar RAMAN, MD;  Location: [facility] ENDOSCOPY;  Service: Pulmonary;  Laterality: N/A;   FLEXIBLE BRONCHOSCOPY  10/25/2020   Procedure: FLEXIBLE BRONCHOSCOPY;  Surgeon: Shelah Lamar RAMAN, MD;  Location: [facility] ENDOSCOPY;  Service:  Pulmonary;;   IABP INSERTION N/A 02/03/2018   Procedure: IABP Insertion;  Surgeon: Swaziland, Peter M, MD;  Location: [facility] INVASIVE CV LAB;  Service: Cardiovascular;  Laterality: N/A;   INTRAOPERATIVE TRANSESOPHAGEAL ECHOCARDIOGRAM N/A 02/03/2018   Procedure: INTRAOPERATIVE TRANSESOPHAGEAL ECHOCARDIOGRAM;  Surgeon: Dusty Sudie DEL, MD;  Location: [facility] OR;  Service: Open Heart Surgery;  Laterality: N/A;   LEFT HEART CATH AND CORONARY ANGIOGRAPHY N/A 02/03/2018   Procedure: LEFT HEART CATH AND CORONARY ANGIOGRAPHY;  Surgeon: Swaziland, Peter M, MD;  Location: [facility] INVASIVE CV LAB;  Service: Cardiovascular;  Laterality: N/A;   LEFT HEART CATH AND CORS/GRAFTS ANGIOGRAPHY N/A 02/12/2019   Procedure: LEFT HEART CATH AND CORS/GRAFTS ANGIOGRAPHY;  Surgeon: Darron Deatrice LABOR, MD;  Location: [facility] INVASIVE CV LAB;  Service: Cardiovascular;  Laterality: N/A;   MEDIASTINOSCOPY N/A 03/11/2014   Procedure: MEDIASTINOSCOPY;  Surgeon: Elspeth JAYSON Millers, MD;  Location: [facility] OR;  Service: Thoracic;  Laterality: N/A;   PLACEMENT OF IMPELLA LEFT VENTRICULAR ASSIST DEVICE  02/03/2018   Procedure: PLACEMENT OF IMPELLA LEFT VENTRICULAR ASSIST DEVICE LD;  Surgeon: Dusty Sudie DEL, MD;  Location: [facility] OR;  Service: Open Heart Surgery;;   REMOVAL OF IMPELLA LEFT VENTRICULAR ASSIST DEVICE N/A 02/08/2018   Procedure: REMOVAL OF IMPELLA LEFT VENTRICULAR ASSIST DEVICE;  Surgeon: Dusty Sudie DEL, MD;  Location: [facility] OR;  Service: Open Heart Surgery;  Laterality: N/A;   RIGHT HEART CATH N/A 02/03/2018   Procedure: RIGHT HEART CATH;  Surgeon: Swaziland, Peter M, MD;  Location: [facility] INVASIVE CV LAB;  Service: Cardiovascular;  Laterality: N/A;   RIGHT HEART CATH N/A 05/09/2018   Procedure: RIGHT HEART CATH;  Surgeon: Cherrie Toribio SAUNDERS, MD;  Location: [facility] INVASIVE CV LAB;  Service: Cardiovascular;  Laterality: N/A;   TEE WITHOUT CARDIOVERSION N/A 02/08/2018   Procedure: TRANSESOPHAGEAL ECHOCARDIOGRAM (TEE);  Surgeon: Dusty Sudie DEL, MD;  Location: [facility] OR;   Service: Open Heart Surgery;  Laterality: N/A;   TUBAL LIGATION     VIDEO BRONCHOSCOPY WITH ENDOBRONCHIAL ULTRASOUND N/A 03/11/2014   Procedure: VIDEO BRONCHOSCOPY WITH ENDOBRONCHIAL ULTRASOUND;  Surgeon: Elspeth JAYSON Millers, MD;  Location: [facility] OR;  Service: Thoracic;  Laterality: N/A;    FAMHx   Family History  Problem Relation Age of Onset   Heart disease Mother    Hypertension Mother    Heart attack Mother    Hypertension Maternal Grandmother    Cancer Maternal Grandmother    Diabetes Paternal Grandmother    Stroke Neg Hx     SOCHx    reports that she quit smoking about 10 years ago. Her smoking use included cigarettes. She started smoking about 30 years ago. She has a 20 pack-year smoking history. She has never used smokeless tobacco. She reports current alcohol use of about 5.0 standard drinks of alcohol per week. She reports current drug use. Drug: Marijuana.  Outpatient Medications   No current facility-administered medications on file prior to encounter.   Current Outpatient Medications on File Prior to Encounter  Medication Sig Dispense Refill   albuterol  (VENTOLIN  HFA) 108 (90 Base) MCG/ACT inhaler Inhale 2 puffs into the lungs every 6 (six) hours as needed for wheezing or  shortness of breath. 8.5 g 1   apixaban  (ELIQUIS ) 2.5 MG TABS tablet Take 1 tablet (2.5 mg total) by mouth 2 (two) times daily. 180 tablet 1   atorvastatin  (LIPITOR ) 80 MG tablet Take 1 tablet (80 mg total) by mouth daily. 90 tablet 0   cyclobenzaprine  (FLEXERIL ) 10 MG tablet Take 1 tablet (10 mg total) by mouth 3 (three) times daily as needed for muscle spasms. 60 tablet 3   dapagliflozin  propanediol (FARXIGA ) 10 MG TABS tablet Take 1 tablet (10 mg total) by mouth daily. (Patient not taking: Reported on 08/14/2024) 90 tablet 3   digoxin  62.5 MCG TABS Take 0.0625 mg by mouth daily. 30 tablet 1   hydrOXYzine  (VISTARIL ) 25 MG capsule TAKE 1 CAPSULE (25 MG TOTAL) BY MOUTH DAILY AS NEEDED. 90 capsule 1    ipratropium-albuterol  (DUONEB) 0.5-2.5 (3) MG/3ML SOLN TAKE 3 MLS BY NEBULIZATION EVERY 4 (FOUR) HOURS AS NEEDED (FOR SHORTNESS OF BREATH). 180 mL 3   ivabradine  (CORLANOR ) 7.5 MG TABS tablet TAKE 1 TABLET BY MOUTH TWICE A DAY WITH FOOD 120 tablet 1   megestrol  (MEGACE ) 20 MG tablet Take 1 tablet (20 mg total) by mouth 2 (two) times daily. 60 tablet 1   metolazone  (ZAROXOLYN ) 5 MG tablet Take 1 tablet (5 mg total) by mouth as needed (as instructed by Advanced HF clinic). 10 tablet 3   Multiple Vitamin (MULTIVITAMIN WITH MINERALS) TABS tablet Take 1 tablet by mouth daily.     nitroGLYCERIN  (NITROSTAT ) 0.4 MG SL tablet Place 1 tablet (0.4 mg total) under the tongue every 5 (five) minutes as needed for chest pain. 90 tablet 0   Nutritional Supplements (ENSURE ORIGINAL) LIQD Take 1 Bottle by mouth 2 (two) times daily as needed (poor appetite). (Patient taking differently: Take 1 Bottle by mouth 2 (two) times daily as needed (poor appetite). Patient only taking 1 daily)     pantoprazole  (PROTONIX ) 40 MG tablet Take 1 tablet (40 mg total) by mouth daily at 6 (six) AM. (Patient not taking: Reported on 08/14/2024) 5 tablet 0   potassium chloride  (KLOR-CON ) 20 MEQ packet Take 80 mEq by mouth 2 (two) times daily. TAKE 80 MEQ TOTAL TWICE A DAY EXCEPT ON METOLAZONE  DAYS OF TUESDAYS TAKE (100 MEQ) 3 TIMES PER DAY. 360 packet 2   spironolactone  (ALDACTONE ) 25 MG tablet Take 1 tablet (25 mg total) by mouth daily. 30 tablet 2   torsemide  (DEMADEX ) 20 MG tablet Take 5 tablets (100 mg total) by mouth 2 (two) times daily.      Inpatient Medications    Scheduled Meds:   Continuous Infusions:   PRN Meds:    ALLERGIES   Allergies  Allergen Reactions   Lactose Intolerance (Gi) Diarrhea   Codeine Nausea And Vomiting    ROS   Review of systems not obtained due to patient factors.  Vitals   Vitals:   08/18/24 1853 08/18/24 1900 08/18/24 1915 08/18/24 1945  BP:  114/78 116/69 116/69  Pulse:  80 79 79   Resp:  17 18 (!) 21  Temp: (!) 97.4 F (36.3 C)     TempSrc: Oral     SpO2:  100% 100% 100%  Weight:      Height:       No intake or output data in the 24 hours ending 08/18/24 2019 Filed Weights   08/18/24 1805  Weight: 40 kg    Physical Exam   General appearance: alert, cachectic, moderate distress, and on bipap Neck: JVD - to  jaw cm above sternal notch, no carotid bruit, and thyroid  not enlarged, symmetric, no tenderness/mass/nodules Lungs: diminished breath sounds bilaterally, rubs bilaterally, and wheezes expiratory Heart: regular tachycardia Abdomen: scaphoid, soft, non-tender Extremities: cool, no edema, dry Pulses: diminished distal pulses Skin: cool and dry Neurologic: Mental status: Alert, oriented, thought content appropriate Psych: Pleasant  Labs   Results for orders placed or performed during the hospital encounter of 08/18/24 (from the past 48 hours)  Brain natriuretic peptide     Status: Abnormal   Collection Time: 08/18/24  6:02 PM  Result Value Ref Range   B Natriuretic Peptide 1,619.7 (H) 0.0 - 100.0 pg/mL    Comment: Performed at [facility], [address], [place], [place] [address]  CBC with Differential     Status: Abnormal   Collection Time: 08/18/24  6:02 PM  Result Value Ref Range   WBC 5.4 4.0 - 10.5 K/uL   RBC 5.93 (H) 3.87 - 5.11 MIL/uL   Hemoglobin 16.8 (H) 12.0 - 15.0 g/dL   HCT 47.0 (H) 63.9 - 53.9 %   MCV 89.2 80.0 - 100.0 fL   MCH 28.3 26.0 - 34.0 pg   MCHC 31.8 30.0 - 36.0 g/dL   RDW 80.1 (H) 88.4 - 84.4 %   Platelets 168 150 - 400 K/uL   nRBC 0.0 0.0 - 0.2 %   Neutrophils Relative % 74 %   Neutro Abs 4.0 1.7 - 7.7 K/uL   Lymphocytes Relative 13 %   Lymphs Abs 0.7 0.7 - 4.0 K/uL   Monocytes Relative 8 %   Monocytes Absolute 0.5 0.1 - 1.0 K/uL   Eosinophils Relative 4 %   Eosinophils Absolute 0.2 0.0 - 0.5 K/uL   Basophils Relative 1 %   Basophils Absolute 0.0 0.0 - 0.1 K/uL   Immature Granulocytes 0 %   Abs  Immature Granulocytes 0.02 0.00 - 0.07 K/uL    Comment: Performed at [facility], [address], [place], [place] [address]  Troponin I (High Sensitivity)     Status: Abnormal   Collection Time: 08/18/24  6:02 PM  Result Value Ref Range   Troponin I (High Sensitivity) 37 (H) <18 ng/L    Comment: (NOTE) Elevated high sensitivity troponin I (hsTnI) values and significant  changes across serial measurements may suggest ACS but many other  chronic and acute conditions are known to elevate hsTnI results.  Refer to the Links section for chest pain algorithms and additional  guidance. Performed at [facility], [address] [place], [place] [address]   Digoxin  level     Status: Abnormal   Collection Time: 08/18/24  6:02 PM  Result Value Ref Range   Digoxin  Level 0.6 (L) 0.8 - 2.0 ng/mL    Comment: Performed at [facility], [address], [place], [place] [address]   *Note: Due to a large number of results and/or encounters for the requested time period, some results have not been displayed. A complete set of results can be found in Results Review.    ECG   Regular tachycardia with baseline artifact - Personally Reviewed  Telemetry   Sinus tachycardia - Personally Reviewed  Radiology   DG Chest Port 1 View Result Date: 08/18/2024 CLINICAL DATA:  Shortness of breath EXAM: PORTABLE CHEST 1 VIEW COMPARISON:  07/15/2024 FINDINGS: Left AICD remains in place, unchanged. Prior CABG. Cardiomegaly. Stable soft tissue fullness in the right hilum related to post treatment changes as seen on  prior CT. No acute confluent opacities or effusions. No acute bony abnormality. IMPRESSION: Cardiomegaly. No active disease. Electronically Signed   By: Franky Crease M.D.   On: 08/18/2024 19:27    Cardiac Studies   N/A  Assessment   Principal Problem:   Acute combined systolic and diastolic CHF, NYHA class 4 (HCC) Active Problems:   PAF (paroxysmal atrial fibrillation) (HCC)    Small cell lung cancer (HCC)   CKD stage 3b, GFR 30-44 ml/min (HCC)   Acute on chronic hypoxic respiratory failure (HCC)   COPD (chronic obstructive pulmonary disease) (HCC)   Acute on chronic HFrEF (heart failure with reduced ejection fraction) (HCC)  Plan   Acute on chronic end-stage systolic congestive heart failure She has end-stage congestive heart failure managed with intermittent outpatient Furoscix  dosing, however she has been unable to reliably get her medication and now presents in decompensated heart failure with rising OptiVol levels requiring BiPAP.  Will initiate IV diuresis with 80 mg Lasix  IV twice daily.  Can use metolazone  as needed.  Continue Aldactone  25 mg daily, continue Farxiga  10 mg daily, digoxin  62.5 mcg daily (level appropriate at 0.6), and ivabradine  7.5 mg twice daily. Advanced HF service to evaluate in the am tomorrow. Will need higher level of care (2C) preferred d/t bipap. Acute on chronic hypoxic respiratory failure requiring BIPAP Chest x-ray shows no acute disease however on exam has significant wheezing and grating lung sounds. Known history of COPD. We will continue BiPAP, DuoNebs and albuterol .  Respiratory viral panel is pending. Chronic hypokalemia She has chronic hypokalemia.  Metabolic profile is pending.  Replete to potassium greater than 4 and magnesium  greater than 2. PAF EKG appears to show a sinus tachycardia with significant artifact.  Continue digoxin  and Eliquis  2.5 mg twice daily. [id number] Metabolic profile is pending. CAD s/p CABGx2 No anginal symptoms.  Initial troponin 37, likely demand ischemia H/O SCLC No evidence of acute findings on chest x-ray  DNR-Limited  CRITICAL CARE TIME: I have spent a total of 60 minutes with patient reviewing hospital notes, telemetry, EKGs, labs and examining the patient as well as establishing an assessment and plan that was discussed with the patient.  > 50% of time was spent in direct patient care. The  patient is critically ill with multi-organ system failure and requires high complexity decision making for assessment and support, frequent evaluation and titration of therapies, application of advanced monitoring technologies and extensive interpretation of multiple databases.  Length of Stay:  LOS: 0 days   Vinie KYM Maxcy, MD, [facility], FNLA, FACP  [facility]  Medical Director of the Advanced Lipid Disorders &  Cardiovascular Risk Reduction Clinic Diplomate of the American Board of Clinical Lipidology Attending Cardiologist  Direct Dial: [phone]  Fax: [phone]  Website:  [email or web address] Maxcy 08/18/2024, 8:19 PM

## 2024-08-18 NOTE — Progress Notes (Signed)
 RT NOTE:  Pt transported to 3E25 on BIPAP. Pt titrated to 2L [place]. Pt tolerating well at this time.

## 2024-08-18 NOTE — ED Notes (Addendum)
 Admitting NP notified on patient's request to discontinue BIPAP , no new orders received . RT notified to reevaluate patient/request to discontinue BIPAP .

## 2024-08-19 ENCOUNTER — Other Ambulatory Visit: Payer: Self-pay

## 2024-08-19 DIAGNOSIS — I5023 Acute on chronic systolic (congestive) heart failure: Secondary | ICD-10-CM

## 2024-08-19 DIAGNOSIS — J9611 Chronic respiratory failure with hypoxia: Secondary | ICD-10-CM

## 2024-08-19 LAB — BASIC METABOLIC PANEL WITH GFR
Anion gap: 12 (ref 5–15)
Anion gap: 12 (ref 5–15)
BUN: 22 mg/dL (ref 8–23)
BUN: 32 mg/dL — ABNORMAL HIGH (ref 8–23)
CO2: 21 mmol/L — ABNORMAL LOW (ref 22–32)
CO2: 24 mmol/L (ref 22–32)
Calcium: 9.2 mg/dL (ref 8.9–10.3)
Calcium: 10.2 mg/dL (ref 8.9–10.3)
Chloride: 102 mmol/L (ref 98–111)
Chloride: 101 mmol/L (ref 98–111)
Creatinine, Ser: 1.41 mg/dL — ABNORMAL HIGH (ref 0.44–1.00)
Creatinine, Ser: 1.62 mg/dL — ABNORMAL HIGH (ref 0.44–1.00)
GFR, Estimated: 42 mL/min — ABNORMAL LOW (ref >60)
GFR, Estimated: 35 mL/min — ABNORMAL LOW (ref >60)
Glucose, Bld: 356 mg/dL — ABNORMAL HIGH (ref 70–99)
Glucose, Bld: 114 mg/dL — ABNORMAL HIGH (ref 70–99)
Potassium: 3.4 mmol/L — ABNORMAL LOW (ref 3.5–5.1)
Potassium: 4.2 mmol/L (ref 3.5–5.1)
Sodium: 135 mmol/L (ref 135–145)
Sodium: 137 mmol/L (ref 135–145)

## 2024-08-19 LAB — GLUCOSE, CAPILLARY
Glucose-Capillary: 276 mg/dL — ABNORMAL HIGH (ref 70–99)
Glucose-Capillary: 129 mg/dL — ABNORMAL HIGH (ref 70–99)
Glucose-Capillary: 217 mg/dL — ABNORMAL HIGH (ref 70–99)

## 2024-08-19 LAB — HEMOGLOBIN A1C
Hgb A1c MFr Bld: 6.5 % — ABNORMAL HIGH (ref 4.8–5.6)
Mean Plasma Glucose: 139.85 mg/dL

## 2024-08-19 MED ORDER — POTASSIUM CHLORIDE 20 MEQ PO PACK: 40.0000 meq | PACK | Freq: Two times a day (BID) | ORAL | Status: DC

## 2024-08-19 MED ORDER — FUROSEMIDE 10 MG/ML IJ SOLN: 80.0000 mg | Freq: Two times a day (BID) | INTRAMUSCULAR | Status: DC

## 2024-08-19 MED ORDER — SODIUM CHLORIDE 0.9% FLUSH
3.0000 mL | Freq: Two times a day (BID) | INTRAVENOUS | Status: DC
  Administered 2024-08-19 – 2024-08-21 (×5): 3 mL via INTRAVENOUS

## 2024-08-19 MED ORDER — SODIUM CHLORIDE 0.9 % IV SOLN: 250.0000 mL | INTRAVENOUS | Status: AC | PRN

## 2024-08-19 MED ORDER — METOLAZONE 5 MG PO TABS
5.0000 mg | ORAL_TABLET | Freq: Once | ORAL | Status: AC
  Administered 2024-08-19: 5 mg via ORAL
  Filled 2024-08-19: qty 1

## 2024-08-19 MED ORDER — POTASSIUM CHLORIDE 20 MEQ PO PACK
60.0000 meq | PACK | Freq: Three times a day (TID) | ORAL | Status: AC
Start: 2024-08-19 — End: 2024-08-19
  Administered 2024-08-19 (×3): 60 meq via ORAL
  Filled 2024-08-19 (×3): qty 3

## 2024-08-19 MED ORDER — FUROSEMIDE 10 MG/ML IJ SOLN
160.0000 mg | Freq: Three times a day (TID) | INTRAVENOUS | Status: DC
  Administered 2024-08-19 – 2024-08-20 (×4): 160 mg via INTRAVENOUS
  Filled 2024-08-19 (×3): qty 16
  Filled 2024-08-19: qty 4
  Filled 2024-08-19: qty 10
  Filled 2024-08-19: qty 16

## 2024-08-19 MED ORDER — ACETAMINOPHEN 325 MG PO TABS: 650.0000 mg | ORAL_TABLET | ORAL | Status: DC | PRN

## 2024-08-19 MED ORDER — INSULIN ASPART 100 UNIT/ML IJ SOLN
0.0000 [IU] | Freq: Three times a day (TID) | INTRAMUSCULAR | Status: DC
  Administered 2024-08-19: 5 [IU] via SUBCUTANEOUS
  Administered 2024-08-19 – 2024-08-20 (×2): 1 [IU] via SUBCUTANEOUS
  Administered 2024-08-20: 2 [IU] via SUBCUTANEOUS
  Administered 2024-08-20: 3 [IU] via SUBCUTANEOUS
  Administered 2024-08-21: 1 [IU] via SUBCUTANEOUS
  Administered 2024-08-21: 2 [IU] via SUBCUTANEOUS

## 2024-08-19 MED ORDER — INSULIN ASPART 100 UNIT/ML IJ SOLN
0.0000 [IU] | Freq: Every day | INTRAMUSCULAR | Status: DC
  Administered 2024-08-19: 2 [IU] via SUBCUTANEOUS

## 2024-08-19 MED ORDER — SODIUM CHLORIDE 0.9% FLUSH: 3.0000 mL | INTRAVENOUS | Status: DC | PRN

## 2024-08-19 MED ORDER — POTASSIUM CHLORIDE CRYS ER 20 MEQ PO TBCR
40.0000 meq | EXTENDED_RELEASE_TABLET | Freq: Two times a day (BID) | ORAL | Status: DC
  Filled 2024-08-19 (×2): qty 2

## 2024-08-19 NOTE — Progress Notes (Signed)
 Advanced Heart Failure Rounding Note  Cardiologist: None  Chief Complaint: End stage HFrEF Subjective:    She has been cancelling her AHF clinic visits recently. Reports death in her family. Device RN called to notify her of fluid accumulation so she presented to the ED.   Poor response to diuresis so far 2/2 small dose. Weight is up ~10 lbs.   Renal function stable.   Now off bipap. Stable on 2L [place].   Feels ok just SOB.  Objective:   Weight Range: 41.2 kg Body mass index is 18.35 kg/m.   Vital Signs:   Temp:  [97.4 F (36.3 C)-98.1 F (36.7 C)] 97.5 F (36.4 C) (09/09 0738) Pulse Rate:  [78-92] 87 (09/09 0738) Resp:  [14-28] 17 (09/09 0738) BP: (106-117)/(53-78) 117/63 (09/09 0738) SpO2:  [100 %] 100 % (09/09 0738) FiO2 (%):  [40 %] 40 % (09/08 1809) Weight:  [40 kg-41.2 kg] 41.2 kg (09/09 0454) Last BM Date : 08/17/24  Weight change: Filed Weights   08/18/24 1805 08/18/24 2200 08/19/24 0454  Weight: 40 kg 40.3 kg 41.2 kg   Intake/Output:   Intake/Output Summary (Last 24 hours) at 08/19/2024 0916 Last data filed at 08/19/2024 0453 Gross per 24 hour  Intake 240 ml  Output 650 ml  Net -410 ml    Physical Exam  General:  frail appearing.  +[place] Neck: JVD ~10 cm.  Cor: Regular rate & rhythm. No murmurs. Lungs: + wheezes Extremities: no edema  Neuro: alert & oriented x 3. Affect pleasant.   Telemetry   NSR 90s, + barostim artifact (Personally reviewed)    Labs    CBC Recent Labs    08/18/24 1802  WBC 5.4  NEUTROABS 4.0  HGB 16.8*  HCT 52.9*  MCV 89.2  PLT 168   Basic Metabolic Panel Recent Labs    90/91/74 2015 08/19/24 0257  NA 140 135  K 3.4* 3.4*  CL 105 102  CO2 23 21*  GLUCOSE 153* 356*  BUN 21 22  CREATININE 1.28* 1.41*  CALCIUM  9.6 9.2   Liver Function Tests No results for input(s): AST, ALT, ALKPHOS, BILITOT, PROT, ALBUMIN  in the last 72 hours. No results for input(s): LIPASE, AMYLASE in the last 72  hours. Cardiac Enzymes No results for input(s): CKTOTAL, CKMB, CKMBINDEX, TROPONINI in the last 72 hours.  BNP: BNP (last 3 results) Recent Labs    07/15/24 2131 07/30/24 1526 08/18/24 1802  BNP 1,303.1* 1,826.2* 1,619.7*    ProBNP (last 3 results) No results for input(s): PROBNP in the last 8760 hours.   D-Dimer No results for input(s): DDIMER in the last 72 hours. Hemoglobin A1C No results for input(s): HGBA1C in the last 72 hours. Fasting Lipid Panel No results for input(s): CHOL, HDL, LDLCALC, TRIG, CHOLHDL, LDLDIRECT in the last 72 hours. Thyroid  Function Tests No results for input(s): TSH, T4TOTAL, T3FREE, THYROIDAB in the last 72 hours.  Invalid input(s): FREET3  Other results:   Imaging    DG Chest Port 1 View Result Date: 08/18/2024 CLINICAL DATA:  Shortness of breath EXAM: PORTABLE CHEST 1 VIEW COMPARISON:  07/15/2024 FINDINGS: Left AICD remains in place, unchanged. Prior CABG. Cardiomegaly. Stable soft tissue fullness in the right hilum related to post treatment changes as seen on prior CT. No acute confluent opacities or effusions. No acute bony abnormality. IMPRESSION: Cardiomegaly. No active disease. Electronically Signed   By: Franky Crease M.D.   On: 08/18/2024 19:27    Medications:   Scheduled Medications:  apixaban   2.5 mg Oral BID   atorvastatin   80 mg Oral Daily   dapagliflozin  propanediol  10 mg Oral Daily   digoxin   0.0625 mg Oral Daily   ivabradine   7.5 mg Oral BID WC   megestrol   20 mg Oral BID   metolazone   5 mg Oral Once   potassium chloride   40 mEq Oral BID   sodium chloride  flush  3 mL Intravenous Q12H   spironolactone   25 mg Oral Daily    Infusions:  sodium chloride      furosemide       PRN Medications: sodium chloride , acetaminophen , albuterol , cyclobenzaprine , feeding supplement, hydrOXYzine , ipratropium-albuterol , nitroGLYCERIN , sodium chloride  flush  Patient Profile   Brittany [name]  is a 63 y.o. female with a history of PAF, CAD s/p emergent CABG in 2019 and DES in 2020, R renal infarct in 2022,  previous smoker quit in 2015, hypertension, previous small cell lung cancer treated with chemo, chest XRT and prophylactic brain radiation in 2015, and chronic systolic HF>end-stage HFrEF. Admitted with end stage HFrEF.   Assessment/Plan  1. End-stage A/C Systolic Heart Failure - EF has been 20-25% for the last 5 years.  - S/p Medtronic ICD and Barostim - Echo 1/25: EF < 20%, mod to severely reduced RV (in setting of RSV) - Numerous hospitalizations over past 18 months  - NYHA IV. Volume overloaded on exam and by OptiVol.   - Start lasix  160 mg TID + metolazone  5 mg x1 today. Replete K.  - Continue Farxiga  10 mg daily. - Continue digoxin  0.0625 mg daily (recently reduced d/t elevated level) - Continue ivabradine  7.5 mg bid. - Continue spironolactone  25 mg daily.  - Not a candidate for advanced therapies due to severity of lung disease.    2. Chronic Hypoxic Respiratory Failure/COPD  - Hx frequent admissions for AE COPD + A/C HFrEF.  - Bipap as needed - Continue inhalers. - Diuresis as above    3. CKD Stage IIIb  - Creatinine baseline ~ 1.4-1.7 - Continue Farxiga  - Labs stable, follow with diuresis   4. PAF  - Regular on exam today.  - Continue Eliquis  2.5 mg BID, reduced dose (weight/creatinine).   5. CAD - History of CABG x 2 2019 - s/p DES ostial ramus extending to left main (2020) - No s/s angina - Continue atorvastatin  80 mg daily - No ASA with need for AC   6. H/o SCLC - Completed treatment 2015.  - CT chest (5/24) soft tissue thickening along mediastinum but no discrete mass    7. H/o Renal Infarct 2022 - On Eliquis . No bleeding issues.   8. GOC/FTT - She is DNR/DNI - Would benefit from hospice at discharge but she has not been ready for this in the past.    Length of Stay: 1  Beckey LITTIE Coe, NP  08/19/2024, 9:16 AM  Advanced  Heart Failure Team Pager [phone] (M-F; 7a - 5p)  Please contact [facility] for night-coverage after hours (5p -7a ) and weekends on [email or web address]

## 2024-08-19 NOTE — Progress Notes (Signed)
 Pt c/o SOB, expiratory wheezes noted on auscultation. Duoneb nebulizer given, med was effective.

## 2024-08-19 NOTE — Inpatient Diabetes Management (Signed)
 Inpatient Diabetes Program Recommendations  AACE/ADA: New Consensus Statement on Inpatient Glycemic Control (2015)  Target Ranges:  Prepandial:   less than 140 mg/dL      Peak postprandial:   less than 180 mg/dL (1-2 hours)      Critically ill patients:  140 - 180 mg/dL   Lab Results  Component Value Date   GLUCAP 175 (H) 07/20/2024   HGBA1C 7.2 (H) 02/16/2024    Review of Glycemic Control  Latest Reference Range & Units 08/18/24 20:15 08/19/24 02:57  Glucose 70 - 99 mg/dL 846 (H) 643 (H)   Diabetes history: DM 2 Outpatient Diabetes medications:  Farxiga  ordered-  NOT TAKING Current orders for Inpatient glycemic control:  Novolog  0-9 units tid with meals and HS Farxiga  10 mg daily Inpatient Diabetes Program Recommendations:    Note hyperglycemia this AM.  Of note, patient did receive Solumedrol in ED.   Agree with current orders.  Will follow.   Thanks,  Randall Bullocks, RN, BC-ADM Inpatient Diabetes Coordinator Pager [phone]  (8a-5p)

## 2024-08-19 NOTE — Progress Notes (Signed)
 Heart Failure Navigator Progress Note  Assessed for Heart & Vascular TOC clinic readiness.  Patient does not meet criteria due to Advanced Heart Failure Team patient of Dr. [name]. .   Navigator will sign off at this time.   Rhae Hammock, BSN, Scientist, clinical (histocompatibility and immunogenetics) Only

## 2024-08-19 NOTE — Progress Notes (Signed)
 Pt currently hospitalized.

## 2024-08-19 NOTE — Progress Notes (Signed)
 Patient refused her scheduled potassium chloride  at 0230, wants it change to powder form. Dr. Orlando informed, awaiting order at this time.

## 2024-08-19 NOTE — TOC Initial Note (Addendum)
 Transition of Care [facility]) - Initial/Assessment Note    Patient Details  Name: Brittany [name]  MRN: [id number] Date of Birth: [birth year]  [facility]) CM/SW Contact:    Justina Delcia Czar, RN Phone Number: 08/19/2024, 5:51 PM  Clinical Narrative:     Readmission  Inpatient CM spoke to pt at bedside. Pt states she wanted to rest, did not sleep well.  Patient has rollator, oxygen, Bipap, and scale. Pt resided at hotel and independent pta.  Family will assist with transportation home.    Will continue to follow for dc needs.          Expected Discharge Plan: Home/Self Care Barriers to Discharge: Continued Medical Work up   Patient Goals and CMS Choice            Expected Discharge Plan and Services   Discharge Planning Services: CM Consult   Living arrangements for the past 2 months: Hotel/Motel                                      Prior Living Arrangements/Services Living arrangements for the past 2 months: Hotel/Motel Lives with:: Self Patient language and need for interpreter reviewed:: Yes Do you feel safe going back to the place where you live?: Yes      Need for Family Participation in Patient Care: No (Comment) Care giver support system in place?: Yes (comment) Current home services: DME (rollator, oxygen, scale, Bipap) Criminal Activity/Legal Involvement Pertinent to Current Situation/Hospitalization: No - Comment as needed  Activities of Daily Living   ADL Screening (condition at time of admission) Independently performs ADLs?: Yes (appropriate for developmental age) Is the patient deaf or have difficulty hearing?: No Does the patient have difficulty seeing, even when wearing glasses/contacts?: No Does the patient have difficulty concentrating, remembering, or making decisions?: No  Permission Sought/Granted Permission sought to share information with : Case Manager, Family Supports, PCP Permission granted to share  information with : Yes, Verbal Permission Granted  Share Information with NAME: [name] Pinal  Permission granted to share info w AGENCY: PCP, DME  Permission granted to share info w Relationship: sister  Permission granted to share info w Contact Information: [phone]  Emotional Assessment Appearance:: Appears stated age Attitude/Demeanor/Rapport: Engaged Affect (typically observed): Accepting Orientation: : Oriented to Self, Oriented to Place, Oriented to  Time, Oriented to Situation   Psych Involvement: No (comment)  Admission diagnosis:  Respiratory distress [R06.03] Acute on chronic combined systolic and diastolic congestive heart failure (HCC) [I50.43] Acute on chronic HFrEF (heart failure with reduced ejection fraction) (HCC) [I50.23] Patient Active Problem List   Diagnosis Date Noted   Acute on chronic HFrEF (heart failure with reduced ejection fraction) (HCC) 08/18/2024   Acute respiratory failure with hypoxia (HCC) 07/16/2024   Hyponatremia 07/16/2024   Acute respiratory failure (HCC) 04/01/2024   History of CAD (coronary artery disease) 12/15/2023   Hx of hypokalemia 12/15/2023   Respiratory distress 12/15/2023   Chronic respiratory failure with hypoxia and hypercapnia (HCC) 10/11/2023   Acute on chronic systolic CHF (congestive heart failure) (HCC) 09/25/2023   Acute kidney injury superimposed on chronic kidney disease (HCC) 08/04/2023   Acute on chronic hypoxic respiratory failure (HCC) 08/03/2023   Trichomonas infection 04/19/2023   Presence of heart assist device (HCC) 04/11/2023   COPD with acute exacerbation (HCC) 03/01/2023   CAD (coronary artery disease) 03/01/2023   Acute hypoxic respiratory failure (HCC) 11/14/2022  Sepsis (HCC) 11/14/2022   Influenza A with pneumonia 11/14/2022   Troponin level elevated 11/14/2022   History of lung cancer 11/14/2022   Acute combined systolic and diastolic CHF, NYHA class 4 (HCC) 07/09/2022   Malnutrition of moderate  degree 06/20/2022   Lactic acidosis 04/12/2022   Prolonged QT interval 04/09/2022   Hypotension 04/09/2022   Chronic systolic heart failure (HCC) 12/11/2021   CHF exacerbation (HCC) 12/10/2021   Renal infarct (HCC) 12/10/2021   COPD exacerbation (HCC) 09/29/2021   Mixed diabetic hyperlipidemia associated with type 2 diabetes mellitus (HCC) 09/29/2021   CKD stage 3b, GFR 30-44 ml/min (HCC) 09/29/2021   Unstable angina (HCC) 02/09/2021   Hemoptysis 10/20/2020   Type 2 diabetes mellitus with hyperlipidemia (HCC) 10/20/2020   Acute on chronic combined systolic and diastolic CHF (congestive heart failure) (HCC) 05/21/2020   Cardiomyopathy, ischemic 04/12/2020   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   NSTEMI (non-ST elevated myocardial infarction) (HCC) 02/12/2019   CHF (congestive heart failure) (HCC) 11/24/2018   Post-operative pain 09/10/2018   COPD (chronic obstructive pulmonary disease) (HCC) 09/04/2018   Recurrent pleural effusion on right 05/05/2018   Acute on chronic respiratory failure with hypoxia (HCC) 05/05/2018   Chronic respiratory failure with hypoxia (HCC)    Asthma    Anxiety    Allergy    HCAP (healthcare-associated pneumonia) 03/15/2015   Chest pain 03/15/2015   Small cell lung cancer (HCC) 03/16/2014   Protein-calorie malnutrition, severe (HCC) 03/14/2014   PAF (paroxysmal atrial fibrillation) (HCC) 03/12/2014   Essential hypertension 05/07/2012   PCP:  Tanda Bleacher, MD Pharmacy:   [id number] - [place], [place] - [address]  [address] Phone: [phone] Fax: [phone]  [place] [facility] [address] [place] [address] Phone: [phone] Fax: [phone]     Social Drivers of Health (SDOH) Social History: SDOH Screenings   Food Insecurity: No Food Insecurity (08/18/2024)  Housing: Low Risk  (08/18/2024)  Transportation Needs: No Transportation  Needs (08/18/2024)  Utilities: Not At Risk (08/18/2024)  Alcohol Screen: Low Risk  (08/14/2024)  Depression (PHQ2-9): Low Risk  (07/22/2024)  Financial Resource Strain: Medium Risk (02/11/2021)  Physical Activity: Insufficiently Active (08/14/2024)  Social Connections: Moderately Isolated (08/18/2024)  Stress: Stress Concern Present (02/11/2021)  Tobacco Use: Medium Risk (08/18/2024)   SDOH Interventions:     Readmission Risk Interventions    02/18/2024    3:51 PM 06/20/2022   10:00 AM  Readmission Risk Prevention Plan  Transportation Screening Complete Complete  PCP or Specialist Appt within 3-5 Days  Complete  HRI or Home Care Consult  Complete  Social Work Consult for Recovery Care Planning/Counseling  Complete  Palliative Care Screening  Not Applicable  Medication Review Oceanographer) Complete Complete  PCP or Specialist appointment within 3-5 days of discharge Complete   HRI or Home Care Consult Complete   Palliative Care Screening Not Applicable   Skilled Nursing Facility Not Applicable

## 2024-08-20 LAB — BASIC METABOLIC PANEL WITH GFR
Anion gap: 13 (ref 5–15)
Anion gap: 13 (ref 5–15)
BUN: 42 mg/dL — ABNORMAL HIGH (ref 8–23)
BUN: 48 mg/dL — ABNORMAL HIGH (ref 8–23)
CO2: 24 mmol/L (ref 22–32)
CO2: 27 mmol/L (ref 22–32)
Calcium: 10.7 mg/dL — ABNORMAL HIGH (ref 8.9–10.3)
Calcium: 10.8 mg/dL — ABNORMAL HIGH (ref 8.9–10.3)
Chloride: 100 mmol/L (ref 98–111)
Chloride: 96 mmol/L — ABNORMAL LOW (ref 98–111)
Creatinine, Ser: 1.61 mg/dL — ABNORMAL HIGH (ref 0.44–1.00)
Creatinine, Ser: 1.82 mg/dL — ABNORMAL HIGH (ref 0.44–1.00)
GFR, Estimated: 36 mL/min — ABNORMAL LOW (ref >60)
GFR, Estimated: 31 mL/min — ABNORMAL LOW (ref >60)
Glucose, Bld: 238 mg/dL — ABNORMAL HIGH (ref 70–99)
Glucose, Bld: 199 mg/dL — ABNORMAL HIGH (ref 70–99)
Potassium: 5.6 mmol/L — ABNORMAL HIGH (ref 3.5–5.1)
Potassium: 4 mmol/L (ref 3.5–5.1)
Sodium: 137 mmol/L (ref 135–145)
Sodium: 136 mmol/L (ref 135–145)

## 2024-08-20 LAB — GLUCOSE, CAPILLARY
Glucose-Capillary: 199 mg/dL — ABNORMAL HIGH (ref 70–99)
Glucose-Capillary: 207 mg/dL — ABNORMAL HIGH (ref 70–99)
Glucose-Capillary: 148 mg/dL — ABNORMAL HIGH (ref 70–99)
Glucose-Capillary: 144 mg/dL — ABNORMAL HIGH (ref 70–99)

## 2024-08-20 MED ORDER — SPIRONOLACTONE 25 MG PO TABS
25.0000 mg | ORAL_TABLET | Freq: Every day | ORAL | Status: DC
  Administered 2024-08-20: 25 mg via ORAL
  Filled 2024-08-20: qty 1

## 2024-08-20 MED ORDER — METOLAZONE 5 MG PO TABS
5.0000 mg | ORAL_TABLET | Freq: Once | ORAL | Status: AC
  Administered 2024-08-20: 5 mg via ORAL
  Filled 2024-08-20: qty 1

## 2024-08-20 MED ORDER — POTASSIUM CHLORIDE CRYS ER 20 MEQ PO TBCR
40.0000 meq | EXTENDED_RELEASE_TABLET | Freq: Once | ORAL | Status: DC
  Filled 2024-08-20: qty 2

## 2024-08-20 MED ORDER — FUROSEMIDE 10 MG/ML IJ SOLN
160.0000 mg | Freq: Three times a day (TID) | INTRAMUSCULAR | Status: AC
  Administered 2024-08-20: 160 mg via INTRAVENOUS
  Filled 2024-08-20: qty 16

## 2024-08-20 NOTE — Progress Notes (Signed)
   08/20/24 2100  BiPAP/CPAP/SIPAP  Reason BIPAP/CPAP not in use Other(comment) (Pt  tolerating RA well. BiPAP on stand by.)  BiPAP/CPAP /SiPAP Vitals  Resp 17  MEWS Score/Color  MEWS Score 1  MEWS Score Color Green

## 2024-08-20 NOTE — Plan of Care (Signed)
   Problem: Education: Goal: Knowledge of General Education information will improve Description: Including pain rating scale, medication(s)/side effects and non-pharmacologic comfort measures Outcome: Progressing   Problem: Clinical Measurements: Goal: Ability to maintain clinical measurements within normal limits will improve Outcome: Progressing   Problem: Clinical Measurements: Goal: Will remain free from infection Outcome: Progressing   Problem: Clinical Measurements: Goal: Diagnostic test results will improve Outcome: Progressing   Problem: Clinical Measurements: Goal: Respiratory complications will improve Outcome: Progressing   Problem: Clinical Measurements: Goal: Cardiovascular complication will be avoided Outcome: Progressing

## 2024-08-20 NOTE — Progress Notes (Signed)
 Advanced Heart Failure Rounding Note  Cardiologist: Dr. Cherrie  Chief Complaint: End stage HFrEF  Subjective:    Diuresed well with high-dose IV lasix  and metolazone . Feels much better. Orthopnea and PND resolved.   Objective:   Weight Range: 41 kg Body mass index is 18.26 kg/m.   Vital Signs:   Temp:  [97.7 F (36.5 C)-99.2 F (37.3 C)] 98.2 F (36.8 C) (09/10 0730) Pulse Rate:  [88-97] 97 (09/10 0844) Resp:  [14-20] 17 (09/10 0730) BP: (102-114)/(54-70) 114/70 (09/10 0730) SpO2:  [100 %] 100 % (09/10 0730) Weight:  [41 kg] 41 kg (09/10 0045) Last BM Date : 08/17/24  Weight change: Filed Weights   08/18/24 2200 08/19/24 0454 08/20/24 0045  Weight: 40.3 kg 41.2 kg 41 kg   Intake/Output:   Intake/Output Summary (Last 24 hours) at 08/20/2024 0932 Last data filed at 08/20/2024 0844 Gross per 24 hour  Intake 480 ml  Output 1550 ml  Net -1070 ml    Physical Exam   General:  Frail appearing. No resp difficulty HEENT: normal Neck: supple. JVP 7-8   Cor: PMI nondisplaced. Regular rate & rhythm. No rubs, gallops or murmurs. Lungs: clear/decreased Abdomen: soft, nontender, nondistended. No hepatosplenomegaly. No bruits or masses. Good bowel sounds. Extremities: no cyanosis, clubbing, rash, edema Neuro: alert & orientedx3, cranial nerves grossly intact. moves all 4 extremities w/o difficulty. Affect pleasant  Telemetry   NSR 90s + barostim artifact Personally reviewed  Labs    CBC Recent Labs    08/18/24 1802  WBC 5.4  NEUTROABS 4.0  HGB 16.8*  HCT 52.9*  MCV 89.2  PLT 168   Basic Metabolic Panel Recent Labs    90/90/74 1548 08/20/24 0227  NA 137 137  K 4.2 5.6*  CL 101 100  CO2 24 24  GLUCOSE 114* 238*  BUN 32* 42*  CREATININE 1.62* 1.61*  CALCIUM  10.2 10.7*   Liver Function Tests No results for input(s): AST, ALT, ALKPHOS, BILITOT, PROT, ALBUMIN  in the last 72 hours. No results for input(s): LIPASE, AMYLASE in the  last 72 hours. Cardiac Enzymes No results for input(s): CKTOTAL, CKMB, CKMBINDEX, TROPONINI in the last 72 hours.  BNP: BNP (last 3 results) Recent Labs    07/15/24 2131 07/30/24 1526 08/18/24 1802  BNP 1,303.1* 1,826.2* 1,619.7*    ProBNP (last 3 results) No results for input(s): PROBNP in the last 8760 hours.   D-Dimer No results for input(s): DDIMER in the last 72 hours. Hemoglobin A1C Recent Labs    08/19/24 1548  HGBA1C 6.5*   Fasting Lipid Panel No results for input(s): CHOL, HDL, LDLCALC, TRIG, CHOLHDL, LDLDIRECT in the last 72 hours. Thyroid  Function Tests No results for input(s): TSH, T4TOTAL, T3FREE, THYROIDAB in the last 72 hours.  Invalid input(s): FREET3  Other results:   Imaging    No results found.   Medications:   Scheduled Medications:  apixaban   2.5 mg Oral BID   atorvastatin   80 mg Oral Daily   dapagliflozin  propanediol  10 mg Oral Daily   digoxin   0.0625 mg Oral Daily   insulin  aspart  0-5 Units Subcutaneous QHS   insulin  aspart  0-9 Units Subcutaneous TID WC   ivabradine   7.5 mg Oral BID WC   megestrol   20 mg Oral BID   sodium chloride  flush  3 mL Intravenous Q12H    Infusions:  furosemide  160 mg (08/20/24 0850)    PRN Medications: acetaminophen , albuterol , cyclobenzaprine , feeding supplement, hydrOXYzine , ipratropium-albuterol , nitroGLYCERIN , sodium chloride   flush  Patient Profile   Brittany [name]  is a 63 y.o. female with a history of PAF, CAD s/p emergent CABG in 2019 and DES in 2020, R renal infarct in 2022,  previous smoker quit in 2015, hypertension, previous small cell lung cancer treated with chemo, chest XRT and prophylactic brain radiation in 2015, and chronic systolic HF>end-stage HFrEF. Admitted with end stage HFrEF.   Assessment/Plan  1. End-stage A/C Systolic Heart Failure - EF has been 20-25% for the last 5 years.  - S/p Medtronic ICD and Barostim - Echo 1/25: EF < 20%,  mod to severely reduced RV (in setting of RSV) - Numerous hospitalizations over past 18 months  - NYHA IV.  - Volume status much improved on high-dose IV lasix  and metolazone . Will give lasix  160 IV bid today and follow  - Continue Farxiga  10 mg daily. - Continue digoxin  0.0625 mg daily (recently reduced d/t elevated level) - Continue ivabradine  7.5 mg bid. - Continue spironolactone  25 mg daily.  - Not a candidate for advanced therapies due to severity of lung disease.  - Will need to arrange outpatient Furiscix for her   2. Chronic Hypoxic Respiratory Failure/COPD  - Hx frequent admissions for AE COPD + A/C HFrEF.  - Improved with diuresis   3. CKD Stage IIIb  - Creatinine baseline ~ 1.4-1.7 - Continue Farxiga  - Scr stable at 1.6 - K 5.6. Will recheck   4. PAF  - Remains in NSR - Continue Eliquis  2.5 mg BID, reduced dose (weight/creatinine).   5. CAD - History of CABG x 2 2019 - s/p DES ostial ramus extending to left main (2020) - No s/s angina - Continue atorvastatin  80 mg daily - No ASA with need for AC   6. H/o SCLC - Completed treatment 2015.  - CT chest (5/24) soft tissue thickening along mediastinum but no discrete mass    7. H/o Renal Infarct 2022 - On Eliquis . No bleeding issues.   8. GOC/FTT - She is DNR/DNI    Length of Stay: 2  Toribio Fuel, MD  12:05 PM   Advanced Heart Failure Team Pager [phone] (M-F; 7a - 5p)  Please contact [facility] for night-coverage after hours (5p -7a ) and weekends on [email or web address]

## 2024-08-21 ENCOUNTER — Encounter (HOSPITAL_COMMUNITY): Payer: Self-pay

## 2024-08-21 ENCOUNTER — Other Ambulatory Visit (HOSPITAL_COMMUNITY): Payer: Self-pay

## 2024-08-21 LAB — GLUCOSE, CAPILLARY
Glucose-Capillary: 160 mg/dL — ABNORMAL HIGH (ref 70–99)
Glucose-Capillary: 127 mg/dL — ABNORMAL HIGH (ref 70–99)
Glucose-Capillary: 93 mg/dL (ref 70–99)

## 2024-08-21 LAB — BASIC METABOLIC PANEL WITH GFR
Anion gap: 18 — ABNORMAL HIGH (ref 5–15)
BUN: 68 mg/dL — ABNORMAL HIGH (ref 8–23)
CO2: 24 mmol/L (ref 22–32)
Calcium: 10.5 mg/dL — ABNORMAL HIGH (ref 8.9–10.3)
Chloride: 91 mmol/L — ABNORMAL LOW (ref 98–111)
Creatinine, Ser: 2.25 mg/dL — ABNORMAL HIGH (ref 0.44–1.00)
GFR, Estimated: 24 mL/min — ABNORMAL LOW (ref >60)
Glucose, Bld: 156 mg/dL — ABNORMAL HIGH (ref 70–99)
Potassium: 4.1 mmol/L (ref 3.5–5.1)
Sodium: 133 mmol/L — ABNORMAL LOW (ref 135–145)

## 2024-08-21 MED ORDER — TORSEMIDE 20 MG PO TABS: 100.0000 mg | ORAL_TABLET | Freq: Two times a day (BID) | ORAL | Status: DC

## 2024-08-21 MED ORDER — DIGOXIN 62.5 MCG PO TABS: 0.0625 mg | ORAL_TABLET | Freq: Every day | ORAL | Status: DC

## 2024-08-21 MED ORDER — DAPAGLIFLOZIN PROPANEDIOL 10 MG PO TABS
10.0000 mg | ORAL_TABLET | Freq: Every day | ORAL | 5 refills | Status: DC
Start: 2024-08-22 — End: 2024-10-22
  Filled 2024-08-21: qty 30, 30d supply, fill #0

## 2024-08-21 MED ORDER — SPIRONOLACTONE 25 MG PO TABS: 25.0000 mg | ORAL_TABLET | Freq: Every day | ORAL | Status: DC

## 2024-08-21 NOTE — Plan of Care (Signed)
  Problem: Education: Goal: Knowledge of General Education information will improve Description: Including pain rating scale, medication(s)/side effects and non-pharmacologic comfort measures Outcome: Progressing   Problem: Clinical Measurements: Goal: Ability to maintain clinical measurements within normal limits will improve Outcome: Progressing   Problem: Clinical Measurements: Goal: Will remain free from infection Outcome: Progressing   Problem: Clinical Measurements: Goal: Diagnostic test results will improve Outcome: Progressing   Problem: Clinical Measurements: Goal: Respiratory complications will improve Outcome: Progressing   Problem: Clinical Measurements: Goal: Cardiovascular complication will be avoided Outcome: Progressing   Problem: Elimination: Goal: Will not experience complications related to urinary retention Outcome: Progressing

## 2024-08-21 NOTE — Progress Notes (Addendum)
 Advanced Heart Failure Rounding Note  Cardiologist: Dr. Cherrie  Chief Complaint: Acute on chronic end stage HFrEF  Subjective:    Diuresed well with high dose IV lasix  and metolazone .  Scr 1.8>2.2  Feeling much better. Respiratory status back to baseline.  Needed a little assistance to get to bathroom.  Objective:   Weight Range: 39.4 kg Body mass index is 17.54 kg/m.   Vital Signs:   Temp:  [98 F (36.7 C)-98.9 F (37.2 C)] 98 F (36.7 C) (09/11 0545) Pulse Rate:  [87-97] 90 (09/11 0037) Resp:  [17-20] 20 (09/11 0037) BP: (90-101)/(59-64) 90/61 (09/11 0545) SpO2:  [96 %-99 %] 98 % (09/11 0037) Weight:  [39.4 kg] 39.4 kg (09/11 0545) Last BM Date : 08/20/24  Weight change: Filed Weights   08/19/24 0454 08/20/24 0045 08/21/24 0545  Weight: 41.2 kg 41 kg 39.4 kg   Intake/Output:   Intake/Output Summary (Last 24 hours) at 08/21/2024 0820 Last data filed at 08/20/2024 2351 Gross per 24 hour  Intake 1019.23 ml  Output 1925 ml  Net -905.77 ml    Physical Exam  General:  Thin, chronically ill appearing Neck: JVP ~ 7 cm Cor: Regular rate & rhythm, tachy. No murmurs. Lungs: diminished Abdomen: soft, nontender, nondistended.  Extremities: no edema Neuro: alert & orientedx3. Affect pleasant   Telemetry   SR/ST 90s-100s, barostim aftifact  Labs    CBC Recent Labs    08/18/24 1802  WBC 5.4  NEUTROABS 4.0  HGB 16.8*  HCT 52.9*  MCV 89.2  PLT 168   Basic Metabolic Panel Recent Labs    90/89/74 1118 08/21/24 0226  NA 136 133*  K 4.0 4.1  CL 96* 91*  CO2 27 24  GLUCOSE 199* 156*  BUN 48* 68*  CREATININE 1.82* 2.25*  CALCIUM  10.8* 10.5*   Liver Function Tests No results for input(s): AST, ALT, ALKPHOS, BILITOT, PROT, ALBUMIN  in the last 72 hours. No results for input(s): LIPASE, AMYLASE in the last 72 hours. Cardiac Enzymes No results for input(s): CKTOTAL, CKMB, CKMBINDEX, TROPONINI in the last 72  hours.  BNP: BNP (last 3 results) Recent Labs    07/15/24 2131 07/30/24 1526 08/18/24 1802  BNP 1,303.1* 1,826.2* 1,619.7*    ProBNP (last 3 results) No results for input(s): PROBNP in the last 8760 hours.   D-Dimer No results for input(s): DDIMER in the last 72 hours. Hemoglobin A1C Recent Labs    08/19/24 1548  HGBA1C 6.5*   Fasting Lipid Panel No results for input(s): CHOL, HDL, LDLCALC, TRIG, CHOLHDL, LDLDIRECT in the last 72 hours. Thyroid  Function Tests No results for input(s): TSH, T4TOTAL, T3FREE, THYROIDAB in the last 72 hours.  Invalid input(s): FREET3  Other results:   Imaging    No results found.   Medications:   Scheduled Medications:  apixaban   2.5 mg Oral BID   atorvastatin   80 mg Oral Daily   dapagliflozin  propanediol  10 mg Oral Daily   digoxin   0.0625 mg Oral Daily   insulin  aspart  0-5 Units Subcutaneous QHS   insulin  aspart  0-9 Units Subcutaneous TID WC   ivabradine   7.5 mg Oral BID WC   megestrol   20 mg Oral BID   potassium chloride   40 mEq Oral Once   sodium chloride  flush  3 mL Intravenous Q12H   spironolactone   25 mg Oral Daily    Infusions:    PRN Medications: acetaminophen , albuterol , cyclobenzaprine , feeding supplement, hydrOXYzine , ipratropium-albuterol , nitroGLYCERIN , sodium chloride  flush  Patient  Profile   Brittany [name]  is a 63 y.o. female with a history of PAF, CAD s/p emergent CABG in 2019 and DES in 2020, R renal infarct in 2022,  previous smoker quit in 2015, hypertension, previous small cell lung cancer treated with chemo, chest XRT and prophylactic brain radiation in 2015, and chronic systolic HF>end-stage HFrEF. Admitted with end stage HFrEF.   Assessment/Plan  1. End-stage A/C Systolic Heart Failure - EF has been 20-25% for the last 5 years.  - S/p Medtronic ICD and Barostim - Echo 1/25: EF < 20%, mod to severely reduced RV (in setting of RSV) - Numerous hospitalizations  over past 18 months  - NYHA IV.  - Euvolemic today, likely overdiuresed. Hold diuretics, farxiga , digoxin  and spironolactone . Plan to restart in next 1-2 days - Continue ivabradine  7.5 mg bid. - Not a candidate for advanced therapies due to severity of lung disease.  - Having trouble getting furoscix  in outpatient setting while waiting for pre-auth approval from insurance. Will provide her with samples from clinic on discharge.   2. Chronic Hypoxic Respiratory Failure/COPD  - Hx frequent admissions for AE COPD + A/C HFrEF.  - Improved with diuresis   3. AKI on CKD Stage IIIb  - Creatinine baseline ~ 1.4-1.7. Scr worse today, likely d/t overdiuresis - Hold diuretics, Farxiga  and Spiro   4. PAF  - Remains in NSR - Continue Eliquis  2.5 mg BID, reduced dose (weight/creatinine).   5. CAD - History of CABG x 2 2019 - s/p DES ostial ramus extending to left main (2020) - No s/s angina - Continue atorvastatin  80 mg daily - No ASA with need for AC   6. H/o SCLC - Completed treatment 2015.  - CT chest (5/24) soft tissue thickening along mediastinum but no discrete mass    7. H/o Renal Infarct 2022 - On Eliquis . No bleeding issues.   8. GOC/FTT - She is DNR/DNI  9. Deconditioning - Needing some assistance with ambulating. Will consult PT/OT to help while in house. She does not want home health or outpatient PT/OT.  Length of Stay: 3  FINCH, LINDSAY N, PA-C  8:20 AM   Advanced Heart Failure Team Pager [phone] (M-F; 7a - 5p)  Please contact [facility] for night-coverage after hours (5p -7a ) and weekends on [email or web address]    Patient seen and examined with the above-signed Advanced Practice Provider and/or Housestaff. I personally reviewed laboratory data, imaging studies and relevant notes. I independently examined the patient and formulated the important aspects of the plan. I have edited the note to reflect any of my changes or salient points. I have personally discussed the  plan with the patient and/or family.  Volume status much improved. Breathing back to baseline. Scr up slightly. Eager to go home  General:  Weak appearing. No resp difficulty HEENT: normal Neck: supple. no JVD. Carotids 2+ bilat; no bruits. No lymphadenopathy or thryomegaly appreciated. Cor: PMI nondisplaced. Regular rate & rhythm. No rubs, gallops or murmurs. Lungs: clear but decreased Abdomen: soft, nontender, nondistended. No hepatosplenomegaly. No bruits or masses. Good bowel sounds. Extremities: no cyanosis, clubbing, rash, edema Neuro: alert & orientedx3, cranial nerves grossly intact. moves all 4 extremities w/o difficulty. Affect pleasant  She is euvolemic. Mild AKI from diuresis. Can go home today. Would hold diuretics today. Likely restart tomorrow. Reviewed meds with her in detail.    MD d/c charge 37 mins.   Toribio Fuel, MD  12:17 PM

## 2024-08-21 NOTE — Progress Notes (Signed)
 Medication Samples have been provided to the patient.  Drug name: Furoscix         Strength: 80mg  /41ml        Qty: 2  LOT: [id number]  Exp.Date: 11/09/25  Dosing instructions: take as directed by the advanced heart failure clinic  The patient has been instructed regarding the correct time, dose, and frequency of taking this medication, including desired effects and most common side effects.   Brittany [name] 1:42 PM 08/21/2024

## 2024-08-21 NOTE — Discharge Summary (Cosign Needed)
 Advanced Heart Failure Team  Discharge Summary   Patient ID: Brittany [name]  MRN: [id number], DOB/AGE: 1961-09-21 63 y.o. Admit date: 08/18/2024 D/C date:     08/21/2024   Primary Discharge Diagnoses:  Acute on chronic end-stage systolic heart failure Acute on chronic respiratory failure AKI on CKD IIIb COPD CAD    Hospital Course:  Raynesha Tiedt  is a 63 y.o. female with a history of PAF, CAD s/p emergent CABG in 2019 and DES in 2020, R renal infarct in 2022,  previous smoker quit in 2015, hypertension, previous small cell lung cancer treated with chemo, chest XRT and prophylactic brain radiation in 2015, and chronic systolic HF EF ~25%   Admitted 01/2018 with NSTEMI and shock. Underwent emergent cath 02/03/18 showed LAD 100% stenosed, LCx 95% stenosed. Taken for emergent CABG 02/03/18. Required impella post op. Hospital course complicated by cardiogenic shock, HCAP, A fib, respiratory failure, and swallowing issues. She was discharged to SNF. Discharge weight 103 pounds.    In 2019 had multiple hospitalizations for HF and pleural effusion. Underwent pleurodesis at [facility].   Admitted 3/20 with NSTEMI and HF. Received DES to ostial ramus into distal left main based on cath below and underwent diuresis. Meds adjusted as tolerated. Echo with EF 25-30%.    Admitted twice in 2023 with low output requiring milrinone . Barostim placed.   She's had numerous admissions in the last 2 years with acute on chronic CHF. Has declined palliative care services.   She was most recently readmitted earlier this month with acute on chronic hypoxic respiratory failure 2/2 AECP and acute on chronic CHF. She required inotrope support with DBA which was weaned off prior to discharge.     She was admitted on 08/18/24 with acute on chronic respiratory failure 2/2 acute on chronic CHF. She initially required BiPAP. She was aggressively diuresed with high-dose IV lasix  and metolazone . Had AKI with Scr up to  2.2, likely d/t overdiuresis. Diuretics, farxiga , spironolactone  and digoxin  held 09/11, to restart on 09/12. She will resume 100 mg Torsemide  BID and PRN metolazone . Given 2 furoscix  samples to use as needed while awaiting preauth for the medication through her insurance.  Has close follow-up in HF clinic scheduled.  She declined Home Health and Palliative Care services.    Discharge weight: 86 lb  Discharge Vitals: Blood pressure 96/68, pulse 94, temperature 98.1 F (36.7 C), temperature source Oral, resp. rate 18, height 4' 11 (1.499 m), weight 39.4 kg, SpO2 100%.  Labs: Lab Results  Component Value Date   WBC 5.4 08/18/2024   HGB 16.8 (H) 08/18/2024   HCT 52.9 (H) 08/18/2024   MCV 89.2 08/18/2024   PLT 168 08/18/2024    Recent Labs  Lab 08/21/24 0226  NA 133*  K 4.1  CL 91*  CO2 24  BUN 68*  CREATININE 2.25*  CALCIUM  10.5*  GLUCOSE 156*   Lab Results  Component Value Date   CHOL 293 (H) 04/19/2023   HDL 53 04/19/2023   LDLCALC 224 (H) 04/19/2023   TRIG 78 04/19/2023   BNP (last 3 results) Recent Labs    07/15/24 2131 07/30/24 1526 08/18/24 1802  BNP 1,303.1* 1,826.2* 1,619.7*    ProBNP (last 3 results) No results for input(s): PROBNP in the last 8760 hours.  Discharge Medications   Allergies as of 08/21/2024       Reactions   Lactose Intolerance (gi) Diarrhea   Codeine Nausea And Vomiting        Medication List  TAKE these medications    albuterol  108 (90 Base) MCG/ACT inhaler Commonly known as: VENTOLIN  HFA Inhale 2 puffs into the lungs every 6 (six) hours as needed for wheezing or shortness of breath.   apixaban  2.5 MG Tabs tablet Commonly known as: Eliquis  Take 1 tablet (2.5 mg total) by mouth 2 (two) times daily.   atorvastatin  80 MG tablet Commonly known as: LIPITOR  Take 1 tablet (80 mg total) by mouth daily.   cyclobenzaprine  10 MG tablet Commonly known as: FLEXERIL  Take 1 tablet (10 mg total) by mouth 3 (three) times  daily as needed for muscle spasms. What changed: how much to take   Digoxin  62.5 MCG Tabs Take 0.0625 mg by mouth daily. Start taking on: August 22, 2024   Ensure Original Liqd Take 1 Bottle by mouth 2 (two) times daily as needed (poor appetite). What changed: when to take this   Farxiga  10 MG Tabs tablet Generic drug: dapagliflozin  propanediol Take 1 tablet (10 mg total) by mouth daily. Start taking on: August 22, 2024   hydrOXYzine  25 MG capsule Commonly known as: VISTARIL  TAKE 1 CAPSULE (25 MG TOTAL) BY MOUTH DAILY AS NEEDED. What changed:  how much to take reasons to take this   ipratropium-albuterol  0.5-2.5 (3) MG/3ML Soln Commonly known as: DUONEB TAKE 3 MLS BY NEBULIZATION EVERY 4 (FOUR) HOURS AS NEEDED (FOR SHORTNESS OF BREATH).   ivabradine  7.5 MG Tabs tablet Commonly known as: CORLANOR  TAKE 1 TABLET BY MOUTH TWICE A DAY WITH FOOD   megestrol  20 MG tablet Commonly known as: MEGACE  Take 1 tablet (20 mg total) by mouth 2 (two) times daily.   metolazone  5 MG tablet Commonly known as: ZAROXOLYN  Take 1 tablet (5 mg total) by mouth as needed (as instructed by Advanced HF clinic).   multivitamin with minerals Tabs tablet Take 1 tablet by mouth daily. Gummies   nitroGLYCERIN  0.4 MG SL tablet Commonly known as: NITROSTAT  Place 1 tablet (0.4 mg total) under the tongue every 5 (five) minutes as needed for chest pain.   pantoprazole  40 MG tablet Commonly known as: PROTONIX  Take 1 tablet (40 mg total) by mouth daily at 6 (six) AM.   potassium chloride  20 MEQ packet Commonly known as: KLOR-CON  Take 80 mEq by mouth 2 (two) times daily. TAKE 80 MEQ TOTAL TWICE A DAY EXCEPT ON METOLAZONE  DAYS OF TUESDAYS TAKE (100 MEQ) 3 TIMES PER DAY.   ROLAIDS PO Take 1 tablet by mouth as needed.   spironolactone  25 MG tablet Commonly known as: ALDACTONE  Take 1 tablet (25 mg total) by mouth daily. Start taking on: August 22, 2024   torsemide  20 MG tablet Commonly  known as: DEMADEX  Take 5 tablets (100 mg total) by mouth 2 (two) times daily. Start taking on: August 22, 2024        Disposition   The patient will be discharged in stable condition to home. Discharge Instructions     Diet - low sodium heart healthy   Complete by: As directed    Heart Failure patients record your daily weight using the same scale at the same time of day   Complete by: As directed    Increase activity slowly   Complete by: As directed        Follow-up Information     [facility] Follow up on 08/28/2024.   Specialty: Cardiology Why: Advanced Heart Failure Clinic 2 PM Contact information: [address] [place]  [phone]  Tanda Bleacher, MD Follow up.   Specialty: Family Medicine Why: hospital follow up appt scheduled for 08/27/2024 at 1:45 pm, please call to reschedule appt if you are unable to keep. bring copay, and insurance card Contact information: [address] [place] [address] [phone]                   Duration of Discharge Encounter: Greater than 35 minutes   Signed, [facility], Sueellen Kayes N  08/21/2024, 1:48 PM    Patient seen and examined with the above-signed Advanced Practice Provider and/or Housestaff. I personally reviewed laboratory data, imaging studies and relevant notes. I independently examined the patient and formulated the important aspects of the plan. I have edited the note to reflect any of my changes or salient points. I have personally discussed the plan with the patient and/or family.   Volume status much improved. Breathing back to baseline. Scr up slightly. Eager to go home   General:  Weak appearing. No resp difficulty HEENT: normal Neck: supple. no JVD. Carotids 2+ bilat; no bruits. No lymphadenopathy or thryomegaly appreciated. Cor: PMI nondisplaced. Regular rate & rhythm. No rubs, gallops or  murmurs. Lungs: clear but decreased Abdomen: soft, nontender, nondistended. No hepatosplenomegaly. No bruits or masses. Good bowel sounds. Extremities: no cyanosis, clubbing, rash, edema Neuro: alert & orientedx3, cranial nerves grossly intact. moves all 4 extremities w/o difficulty. Affect pleasant  She is euvolemic. Mild AKI from diuresis. Can go home today. Would hold diuretics today. Likely restart tomorrow. Reviewed meds with her in detail.      MD d/c charge 37 mins.    Toribio Fuel, MD

## 2024-08-21 NOTE — TOC Transition Note (Signed)
 Transition of Care [facility]) - Discharge Note   Patient Details  Name: Brittany [name]  MRN: [id number] Date of Birth: 06-06-1961  Transition of Care [facility]) CM/SW Contact:  Justina Delcia Czar, RN Phone Number: [phone] 08/21/2024, 12:06 PM   Clinical Narrative:     Spoke to pt and states she at this time does not want HH. She had HH several times in the past. States she is able to manage once she gets home. Discussed setting up again outpatient Palliative Services. Pt was set up in the past. Patient declines at this time.   Sister will provide transportation home. Pt states she does not need a portable to dc home. She only uses oxygen when needed. Pt is currently off oxygen.    Final next level of care: Home/Self Care Barriers to Discharge: No Barriers Identified   Patient Goals and CMS Choice Patient states their goals for this hospitalization and ongoing recovery are:: wants to remain independent CMS Medicare.gov Compare Post Acute Care list provided to:: Patient Choice offered to / list presented to : Patient      Discharge Placement                       Discharge Plan and Services Additional resources added to the After Visit Summary for     Discharge Planning Services: CM Consult Post Acute Care Choice: Home Health                    HH Arranged: Patient Refused [facility]          Social Drivers of Health (SDOH) Interventions SDOH Screenings   Food Insecurity: No Food Insecurity (08/18/2024)  Housing: Low Risk  (08/18/2024)  Transportation Needs: No Transportation Needs (08/18/2024)  Utilities: Not At Risk (08/18/2024)  Alcohol Screen: Low Risk  (08/14/2024)  Depression (PHQ2-9): Low Risk  (07/22/2024)  Financial Resource Strain: Medium Risk (02/11/2021)  Physical Activity: Insufficiently Active (08/14/2024)  Social Connections: Moderately Isolated (08/18/2024)  Stress: Stress Concern Present (02/11/2021)  Tobacco Use: Medium Risk (08/18/2024)     Readmission  Risk Interventions    08/21/2024   12:01 PM 02/18/2024    3:51 PM 06/20/2022   10:00 AM  Readmission Risk Prevention Plan  Transportation Screening Complete Complete Complete  PCP or Specialist Appt within 3-5 Days   Complete  HRI or Home Care Consult   Complete  Social Work Consult for Recovery Care Planning/Counseling   Complete  Palliative Care Screening   Not Applicable  Medication Review Oceanographer) Complete Complete Complete  PCP or Specialist appointment within 3-5 days of discharge Complete Complete   HRI or Home Care Consult Patient refused Complete   Palliative Care Screening Patient Refused Not Applicable   Skilled Nursing Facility Not Applicable Not Applicable

## 2024-08-22 ENCOUNTER — Telehealth: Payer: Self-pay | Admitting: *Deleted

## 2024-08-22 ENCOUNTER — Other Ambulatory Visit (HOSPITAL_COMMUNITY): Payer: Self-pay

## 2024-08-22 ENCOUNTER — Encounter (HOSPITAL_COMMUNITY)

## 2024-08-22 NOTE — Transitions of Care (Post Inpatient/ED Visit) (Signed)
 08/22/2024  Name: Brittany [name]  MRN: [id number] DOB: 08/18/1961  Today's TOC FU Call Status: Today's TOC FU Call Status:: Successful TOC FU Call Completed TOC FU Call Complete Date: 08/22/24 Patient's Name and Date of Birth confirmed.  Transition Care Management Follow-up Telephone Call Date of Discharge: 08/21/24 Discharge Facility: [facility]) Type of Discharge: Inpatient Admission Primary Inpatient Discharge Diagnosis:: Acute combined systolic and diastolic CHF, NYHA class 4 How have you been since you were released from the hospital?: Same Any questions or concerns?: No  Items Reviewed: Did you receive and understand the discharge instructions provided?: Yes Medications obtained,verified, and reconciled?: No Medications Not Reviewed Reasons:: Other: (patient declined medication review, I am very well informed) Any new allergies since your discharge?: No Dietary orders reviewed?: Yes Type of Diet Ordered:: low sodium heart healthy Do you have support at home?: Yes People in Home [RPT]: alone, sibling(s) Name of Support/Comfort Primary Source: Sister/Pamela  Medications Reviewed Today:Patient declined medication review Medications Reviewed Today     Reviewed by Lucky Andrea LABOR, RN (Registered Nurse) on 08/22/24 at 1204  Med List Status: <None>   Medication Order Taking? Sig Documenting Provider Last Dose Status Informant  albuterol  (VENTOLIN  HFA) 108 (90 Base) MCG/ACT inhaler [id number] No Inhale 2 puffs into the lungs every 6 (six) hours as needed for wheezing or shortness of breath. Fairy Frames, MD 08/18/2024 Active Self, Pharmacy Records  apixaban  (ELIQUIS ) 2.5 MG TABS tablet [id number] No Take 1 tablet (2.5 mg total) by mouth 2 (two) times daily. Colletta Manuelita Garre, [place] 08/18/2024  3:30 PM Active Self, Pharmacy Records  atorvastatin  (LIPITOR ) 80 MG tablet [id number] No Take 1 tablet (80 mg total) by mouth daily. Nicholas Bar, MD Past Week Active Self,  Pharmacy Records  cyclobenzaprine  (FLEXERIL ) 10 MG tablet [id number] No Take 1 tablet (10 mg total) by mouth 3 (three) times daily as needed for muscle spasms.  Patient taking differently: Take 5 mg by mouth 3 (three) times daily as needed for muscle spasms.   Tanda Bleacher, MD Past Week Active Self, Pharmacy Records  dapagliflozin  propanediol (FARXIGA ) 10 MG TABS tablet [id number]  Take 1 tablet (10 mg total) by mouth daily. Colletta Manuelita Garre DEVONNA  Active   Digoxin  62.5 MCG TABS [id number]  Take 0.0625 mg by mouth daily. Colletta Manuelita Garre, PA-C  Active   Dihydroxyaluminum Sod Carb (ROLAIDS PO) [id number] No Take 1 tablet by mouth as needed. [provider] Past Week Active Self, Pharmacy Records  hydrOXYzine  (VISTARIL ) 25 MG capsule [id number] No TAKE 1 CAPSULE (25 MG TOTAL) BY MOUTH DAILY AS NEEDED.  Patient taking differently: Take 12.5 mg by mouth daily as needed for anxiety.   Lorren Greig PARAS, NP Past Week Active Self, Pharmacy Records  ipratropium-albuterol  (DUONEB) 0.5-2.5 (3) MG/3ML SOLN [id number] No TAKE 3 MLS BY NEBULIZATION EVERY 4 (FOUR) HOURS AS NEEDED (FOR SHORTNESS OF BREATH). Tanda Bleacher, MD 08/18/2024 Active Self, Pharmacy Records  ivabradine  (CORLANOR ) 7.5 MG TABS tablet [id number] No TAKE 1 TABLET BY MOUTH TWICE A DAY WITH FOOD Bensimhon, Toribio SAUNDERS, MD 08/18/2024 Active Self, Pharmacy Records  megestrol  (MEGACE ) 20 MG tablet [id number] No Take 1 tablet (20 mg total) by mouth 2 (two) times daily. Tanda Bleacher, MD 08/18/2024 Active Self, Pharmacy Records  metolazone  (ZAROXOLYN ) 5 MG tablet [id number] No Take 1 tablet (5 mg total) by mouth as needed (as instructed by Advanced HF clinic). Bensimhon, Toribio SAUNDERS, MD Unknown Active Self, Pharmacy Records  Multiple Vitamin (MULTIVITAMIN WITH MINERALS) TABS tablet [id number] No  Take 1 tablet by mouth daily. Gummies [provider] Past Week Active Self, Pharmacy Records  nitroGLYCERIN  (NITROSTAT ) 0.4 MG SL tablet [id number] No  Place 1 tablet (0.4 mg total) under the tongue every 5 (five) minutes as needed for chest pain. Lee, [place], NP Unknown Active Self, Pharmacy Records  Nutritional Supplements [facility] ORIGINAL) LIQD [id number] No Take 1 Bottle by mouth 2 (two) times daily as needed (poor appetite).  Patient taking differently: Take 1 Bottle by mouth in the morning and at bedtime.   [provider] Past Week Active Self, Pharmacy Records           Med Note [facility], DONETA GORMAN Schaumann Apr 19, 2023  3:53 AM)    pantoprazole  (PROTONIX ) 40 MG tablet [id number] No Take 1 tablet (40 mg total) by mouth daily at 6 (six) AM. Cherlyn Labella, MD Past Week Active Self, Pharmacy Records  potassium chloride  (KLOR-CON ) 20 MEQ packet [id number] No Take 80 mEq by mouth 2 (two) times daily. TAKE 80 MEQ TOTAL TWICE A DAY EXCEPT ON METOLAZONE  DAYS OF TUESDAYS TAKE (100 MEQ) 3 TIMES PER DAY. [name], Harlene HERO, FNP Past Week Active Self, Pharmacy Records  spironolactone  (ALDACTONE ) 25 MG tablet [id number]  Take 1 tablet (25 mg total) by mouth daily. Colletta Manuelita Garre, PA-C  Active   torsemide  (DEMADEX ) 20 MG tablet [id number]  Take 5 tablets (100 mg total) by mouth 2 (two) times daily. Colletta Manuelita Garre, PA-C  Active             Home Care and Equipment/Supplies: Were Home Health Services Ordered?: No (Patient declined [facility] or Palliative Care) Any new equipment or medical supplies ordered?: No  Functional Questionnaire: Do you need assistance with bathing/showering or dressing?: No Do you need assistance with meal preparation?: No Do you need assistance with eating?: No Do you have difficulty maintaining continence: No Do you need assistance with getting out of bed/getting out of a chair/moving?: No Do you have difficulty managing or taking your medications?: No  Follow up appointments reviewed: PCP Follow-up appointment confirmed?: Yes Date of PCP follow-up appointment?: 08/27/24 Follow-up Provider: [facility] Follow-up appointment confirmed?: Yes Date of Specialist follow-up appointment?: 08/28/24 Follow-Up Specialty Provider:: HF Clinic Do you need transportation to your follow-up appointment?: No (Patient declines public transportation. she will ask a friend to transport her to upcoming appointments.) Do you understand care options if your condition(s) worsen?: Yes-patient verbalized understanding  RNCM unable to perform SDOH assessment. Patient declined TOC 30-day enrollment and ended the call prior to [facility] assessment.  Andrea Dimes RN, BSN [place]  [facility] RN Care Manager [phone]

## 2024-08-22 NOTE — Telephone Encounter (Signed)
 Advanced Heart Failure Patient Advocate Encounter  Prior authorization for Furoscix  (Key [id number]) is pending. This plan has a turnaround time of 14 days unless prior auth is submitted as urgent. Will continue to follow up.

## 2024-08-25 ENCOUNTER — Ambulatory Visit: Attending: Cardiology

## 2024-08-25 DIAGNOSIS — I5022 Chronic systolic (congestive) heart failure: Secondary | ICD-10-CM

## 2024-08-25 DIAGNOSIS — Z95810 Presence of automatic (implantable) cardiac defibrillator: Secondary | ICD-10-CM

## 2024-08-26 ENCOUNTER — Other Ambulatory Visit: Payer: Self-pay

## 2024-08-26 ENCOUNTER — Other Ambulatory Visit (HOSPITAL_COMMUNITY): Payer: Self-pay

## 2024-08-26 ENCOUNTER — Encounter: Admitting: Pulmonary Disease

## 2024-08-26 ENCOUNTER — Encounter (HOSPITAL_COMMUNITY): Payer: Self-pay

## 2024-08-26 MED ORDER — FUROSCIX 80 MG/10ML ~~LOC~~ CTKT
80.0000 mg | CARTRIDGE | Freq: Every day | SUBCUTANEOUS | 1 refills | Status: DC | PRN
  Filled 2024-08-26: qty 10, 10d supply, fill #0
  Filled 2024-09-10: qty 10, 10d supply, fill #1

## 2024-08-26 NOTE — Progress Notes (Signed)
 [facility] Note  PCP:  Tanda Bleacher, MD  Oncology: Dr Gatha  HF Cardiologist: Dr Cherrie  HPI: Brittany [name]  is a 63 y.o. 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 Underwent emergent cath 02/03/18 showed LAD 100% stenosed, LCx 95% stenosed. Taken for emergent CABG 02/03/18. Required impella post op. Hospital course complicated by cardiogenic shock, HCAP, A fib, respiratory failure, and swallowing issues. She was discharged to SNF. Discharge weight 103 pounds.    In 2019 had multiple hospitalizations for HF and pleural effusion. Underwent pleurodesis at [facility].   Admitted 3/20 with NSTEMI and HF. Received DES to ostial ramus into distal left main based on cath below and underwent diuresis. Meds adjusted as tolerated. Echo with EF 25-30%.    Admitted twice in 2023 with low output requiring milrinone . Barostim placed.   Multiple CHF and AECOPD admission in May, August, October of 2024 and Janurary 2025. Echo 1/25 EF  < 20%, G2DD, RV moderately to severely reduced. She remained DNR.  Admitted 3/25 for CHF and AECOPD. Readmitted 4/25 for CHF/COPD. Was hypotensive. Moved to ICU. Required DBA and IV diuresis. Refused Palla[facility] Care services.   She was readmitted earlier this month with acute on chronic hypoxic respiratory failure 2/2 AECP and acute on chronic CHF. She required inotrope support with DBA which was wean off prior to discharge.   Remote ICD interrogation 08/18 indicated fluid accumulation since 08/12. We attempted to arrange for Furoscix  08/19 but she was unable to get to clinic to pick up the kit. She did use 5 mg metolazone .  Today she returns for AHF follow up, volume  overloaded last week in clinic and was instructed to use furoscix  x1. Overall feeling ***. Denies palpitations, CP, dizziness, edema, or PND/Orthopnea. *** SOB. Appetite ok. No fever or chills. Weight at home *** pounds. Taking all medications. Denies ETOH, tobacco or drug use.    Past Medical History:  Diagnosis Date   Acute respiratory failure (HCC) 05/05/2018   Acute systolic congestive heart failure (HCC) 02/03/2018   AICD (automatic cardioverter/defibrillator) present    Allergy    Anxiety    Asthma    DM2 (diabetes mellitus, type 2) (HCC) 10/20/2020   Hypertension    PAF (paroxysmal atrial fibrillation) (HCC)    Presence of permanent cardiac pacemaker    Prophylactic measure 08/03/14-08/19/14   Prophyl. 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   LIMA to LAD, SVG to D1, SVG to OM1, EVH via right thigh with implantation of Impella LD LVAD via direct aortic approach   Small cell lung cancer (HCC) 03/16/2014   Past Surgical History:  Procedure Laterality Date   BRONCHIAL BRUSHINGS  10/25/2020   Procedure: BRONCHIAL BRUSHINGS;  Surgeon: Shelah Lamar RAMAN, MD;  Location: [facility] ENDOSCOPY;  Service: Pulmonary;;   BRONCHIAL NEEDLE ASPIRATION BIOPSY  10/25/2020   Procedure: BRONCHIAL NEEDLE ASPIRATION BIOPSIES;  Surgeon: Shelah Lamar RAMAN, MD;  Location: [facility] ENDOSCOPY;  Service: Pulmonary;;   CARDIAC DEFIBRILLATOR PLACEMENT  08/15/2018   MDT Visia AF MRI VR ICD implanted by Dr Zulma for primary prevention of sudden   CESAREAN SECTION     CORONARY ARTERY BYPASS GRAFT N/A 02/03/2018  Procedure: CORONARY ARTERY BYPASS GRAFTING (CABG);  Surgeon: Dusty Sudie DEL, MD;  Location: [facility] OR;  Service: Open Heart Surgery;  Laterality: N/A;  Time 3 using left internal mammary artery and endoscopically harvested right saphenous vein   CORONARY BALLOON ANGIOPLASTY N/A 02/03/2018   Procedure: CORONARY BALLOON ANGIOPLASTY;  Surgeon: [place]il[place], Peter M, MD;  Location: [facility] INVASIVE CV LAB;  Service:  Cardiovascular;  Laterality: N/A;   CORONARY STENT INTERVENTION N/A 02/12/2019   Procedure: CORONARY STENT INTERVENTION;  Surgeon: Darron Deatrice LABOR, MD;  Location: [facility] INVASIVE CV LAB;  Service: Cardiovascular;  Laterality: N/A;   CORONARY/GRAFT ACUTE MI REVASCULARIZATION N/A 02/03/2018   Procedure: Coronary/Graft Acute MI Revascularization;  Surgeon: [place]il[place], Peter M, MD;  Location: [facility] INVASIVE CV LAB;  Service: Cardiovascular;  Laterality: N/A;   ENDOBRONCHIAL ULTRASOUND N/A 10/25/2020   Procedure: ENDOBRONCHIAL ULTRASOUND;  Surgeon: Shelah Lamar RAMAN, MD;  Location: [facility] ENDOSCOPY;  Service: Pulmonary;  Laterality: N/A;   FLEXIBLE BRONCHOSCOPY  10/25/2020   Procedure: FLEXIBLE BRONCHOSCOPY;  Surgeon: Shelah Lamar RAMAN, MD;  Location: [facility] ENDOSCOPY;  Service: Pulmonary;;   IABP INSERTION N/A 02/03/2018   Procedure: IABP Insertion;  Surgeon: [place], Peter M, MD;  Location: [facility] INVASIVE CV LAB;  Service: Cardiovascular;  Laterality: N/A;   INTRAOPERATIVE TRANSESOPHAGEAL ECHOCARDIOGRAM N/A 02/03/2018   Procedure: INTRAOPERATIVE TRANSESOPHAGEAL ECHOCARDIOGRAM;  Surgeon: Dusty Sudie DEL, MD;  Location: [facility] OR;  Service: Open Heart Surgery;  Laterality: N/A;   LEFT HEART CATH AND CORONARY ANGIOGRAPHY N/A 02/03/2018   Procedure: LEFT HEART CATH AND CORONARY ANGIOGRAPHY;  Surgeon: [place], Peter M, MD;  Location: [facility] INVASIVE CV LAB;  Service: Cardiovascular;  Laterality: N/A;   LEFT HEART CATH AND CORS/GRAFTS ANGIOGRAPHY N/A 02/12/2019   Procedure: LEFT HEART CATH AND CORS/GRAFTS ANGIOGRAPHY;  Surgeon: Darron Deatrice LABOR, MD;  Location: [facility] INVASIVE CV LAB;  Service: Cardiovascular;  Laterality: N/A;   MEDIASTINOSCOPY N/A 03/11/2014   Procedure: MEDIASTINOSCOPY;  Surgeon: Elspeth JAYSON Millers, MD;  Location: [facility] OR;  Service: Thoracic;  Laterality: N/A;   PLACEMENT OF IMPELLA LEFT VENTRICULAR ASSIST DEVICE  02/03/2018   Procedure: PLACEMENT OF IMPELLA LEFT VENTRICULAR ASSIST DEVICE LD;  Surgeon: Dusty Sudie DEL, MD;   Location: [facility] OR;  Service: Open Heart Surgery;;   REMOVAL OF IMPELLA LEFT VENTRICULAR ASSIST DEVICE N/A 02/08/2018   Procedure: REMOVAL OF IMPELLA LEFT VENTRICULAR ASSIST DEVICE;  Surgeon: Dusty Sudie DEL, MD;  Location: [facility] OR;  Service: Open Heart Surgery;  Laterality: N/A;   RIGHT HEART CATH N/A 02/03/2018   Procedure: RIGHT HEART CATH;  Surgeon: [place]azil[place], Peter M, MD;  Location: [facility] INVASIVE CV LAB;  Service: Cardiovascular;  Laterality: N/A;   RIGHT HEART CATH N/A 05/09/2018   Procedure: RIGHT HEART CATH;  Surgeon: Cherrie Toribio SAUNDERS, MD;  Location: [facility] INVASIVE CV LAB;  Service: Cardiovascular;  Laterality: N/A;   TEE WITHOUT CARDIOVERSION N/A 02/08/2018   Procedure: TRANSESOPHAGEAL ECHOCARDIOGRAM (TEE);  Surgeon: Dusty Sudie DEL, MD;  Location: [facility] OR;  Service: Open Heart Surgery;  Laterality: N/A;   TUBAL LIGATION     VIDEO BRONCHOSCOPY WITH ENDOBRONCHIAL ULTRASOUND N/A 03/11/2014   Procedure: VIDEO BRONCHOSCOPY WITH ENDOBRONCHIAL ULTRASOUND;  Surgeon: Elspeth JAYSON Millers, MD;  Location: [facility] OR;  Service: Thoracic;  Laterality: N/A;   Current Outpatient Medications  Medication Sig Dispense Refill   albuterol  (VENTOLIN  HFA) 108 (90 Base) MCG/ACT inhaler Inhale 2 puffs into the lungs every 6 (six) hours as needed for wheezing or shortness of breath. 8.5 g 1   apixaban  (ELIQUIS ) 2.5 MG  TABS tablet Take 1 tablet (2.5 mg total) by mouth 2 (two) times daily. 180 tablet 1   atorvastatin  (LIPITOR ) 80 MG tablet Take 1 tablet (80 mg total) by mouth daily. 90 tablet 0   cyclobenzaprine  (FLEXERIL ) 10 MG tablet Take 1 tablet (10 mg total) by mouth 3 (three) times daily as needed for muscle spasms. (Patient taking differently: Take 5 mg by mouth 3 (three) times daily as needed for muscle spasms.) 60 tablet 3   dapagliflozin  propanediol (FARXIGA ) 10 MG TABS tablet Take 1 tablet (10 mg total) by mouth daily. 30 tablet 5   Digoxin  62.5 MCG TABS Take 0.0625 mg by mouth daily.     Dihydroxyaluminum Sod Carb (ROLAIDS  PO) Take 1 tablet by mouth as needed.     Furosemide  (FUROSCIX ) 80 MG/10ML CTKT Inject 80 mg into the skin daily as needed (fluid and edema). Take as directed by the heart failure clinic only 10 each 1   hydrOXYzine  (VISTARIL ) 25 MG capsule TAKE 1 CAPSULE (25 MG TOTAL) BY MOUTH DAILY AS NEEDED. (Patient taking differently: Take 12.5 mg by mouth daily as needed for anxiety.) 90 capsule 1   ipratropium-albuterol  (DUONEB) 0.5-2.5 (3) MG/3ML SOLN TAKE 3 MLS BY NEBULIZATION EVERY 4 (FOUR) HOURS AS NEEDED (FOR SHORTNESS OF BREATH). 180 mL 3   ivabradine  (CORLANOR ) 7.5 MG TABS tablet TAKE 1 TABLET BY MOUTH TWICE A DAY WITH FOOD 120 tablet 1   megestrol  (MEGACE ) 20 MG tablet Take 1 tablet (20 mg total) by mouth 2 (two) times daily. 60 tablet 1   metolazone  (ZAROXOLYN ) 5 MG tablet Take 1 tablet (5 mg total) by mouth as needed (as instructed by Advanced HF clinic). 10 tablet 3   Multiple Vitamin (MULTIVITAMIN WITH MINERALS) TABS tablet Take 1 tablet by mouth daily. Gummies     nitroGLYCERIN  (NITROSTAT ) 0.4 MG SL tablet Place 1 tablet (0.4 mg total) under the tongue every 5 (five) minutes as needed for chest pain. 90 tablet 0   Nutritional Supplements (ENSURE ORIGINAL) LIQD Take 1 Bottle by mouth 2 (two) times daily as needed (poor appetite). (Patient taking differently: Take 1 Bottle by mouth in the morning and at bedtime.)     pantoprazole  (PROTONIX ) 40 MG tablet Take 1 tablet (40 mg total) by mouth daily at 6 (six) AM. 5 tablet 0   potassium chloride  (KLOR-CON ) 20 MEQ packet Take 80 mEq by mouth 2 (two) times daily. TAKE 80 MEQ TOTAL TWICE A DAY EXCEPT ON METOLAZONE  DAYS OF TUESDAYS TAKE (100 MEQ) 3 TIMES PER DAY. 360 packet 2   spironolactone  (ALDACTONE ) 25 MG tablet Take 1 tablet (25 mg total) by mouth daily.     torsemide  (DEMADEX ) 20 MG tablet Take 5 tablets (100 mg total) by mouth 2 (two) times daily.     No current facility-administered medications for this visit.    Allergies:   Lactose intolerance  (gi) and Codeine   Social History:  The patient  reports that she quit smoking about 10 years ago. Her smoking use included cigarettes. She started smoking about 30 years ago. She has a 20 pack-year smoking history. She has never used smokeless tobacco. She reports current alcohol use of about 5.0 standard drinks of alcohol per week. She reports current drug use. Drug: Marijuana.   Family History:  The patient's family history includes Cancer in her maternal grandmother; Diabetes in her paternal grandmother; Heart attack in her mother; Heart disease in her mother; Hypertension in her maternal grandmother and mother.  ROS:  Please see the history of present illness.   All other systems are personally reviewed and negative.    Wt Readings from Last 3 Encounters:  08/21/24 39.4 kg (86 lb 13.8 oz)  08/14/24 40.2 kg (88 lb 9.6 oz)  07/30/24 40.3 kg (88 lb 12.8 oz)   There were no vitals taken for this visit.  Physical Exam General:  *** appearing.  No respiratory difficulty Neck: JVD *** cm.  Cor: Regular rate & rhythm. No murmurs. Lungs: clear Extremities: no edema  Neuro: alert & oriented x 3. Affect pleasant.   Device interrogation (personally reviewed): OptiVol up and down, trending back up since 08/12, thoracic impedance below threshold, no AT/AF or VT ***  Assessment & Plan: 1. End-stage Chronic Systolic Heart Failure - EF has been 20-25% for the last 5 years.  - S/p Medtronic ICD and Barostim - Echo 1/25: EF < 20%, mod to severely reduced RV (in setting of RSV) - Numerous hospitalizations over past 18 months  - NYHA III. Volume overloaded on exam and by OptiVol.   - She will use Furoscix  this afternoon and on 08/22 (does not diurese well with metolazone ). Take an extra 60 mEq KCL with furoscix  *** - Continue torsemide  100 mg bid + 40 KCL BID (will take 100 mg Torsemide  X 1 on Furoscix  days - Restart Farxiga  10 mg daily. - Continue digoxin  0.0625 mg daily (recently reduced d/t  elevated level) - Continue ivabradine  7.5 mg bid. - Continue spironolactone  25 mg daily.  - Not a candidate for advanced therapies due to severity of lung disease.  - CMET/BNP today  2. Chronic Hypoxic Respiratory Failure/COPD  - Hx frequent admissions for AE COPD + A/C HFrEF.  - No longer on oxygen. - Continue inhalers. - Diuresis as above   3. CKD Stage IIIb  - Creatinine baseline ~ 1.4-1.7 - Restart Farxiga  - Labs today   4. PAF  - Regular on exam today. No episodes on device check. - Continue Eliquis  2.5 mg BID, reduced dose (weight/creatinine).   5. CAD - History of CABG x 2 2019 - s/p DES ostial ramus extending to left main (2020) - No s/s angina - Continue atorvastatin  80 mg daily - No ASA with need for AC   6. H/o SCLC - Completed treatment 2015.  - CT chest (5/24) soft tissue thickening along mediastinum but no discrete mass    7. H/o Renal Infarct 2022 - On Eliquis . No bleeding issues.  8. GOC/FTT - Dr. Bensimhon previously had a long discussion about role of Hospice Care for support to prevent suffering if she continues to deteriorate -> she is not interested in this - She is DNR/DNI  Will look into having meds transferred to [facility] for home delivery. ***  Follow up 1 week with APP to reassess volume  Harlene CHRISTELLA Gainer, [place] 08/26/24

## 2024-08-26 NOTE — Progress Notes (Signed)
 Specialty Pharmacy Initial Fill Coordination Note  Brittany [name]  is a 63 y.o. female contacted today regarding initial fill of specialty medication(s) Furosemide  (Furoscix )   Patient requested Courier to Provider Office   Delivery date: 08/28/24   Verified address: [facility] - [address] [place] [address]   Medication will be filled on 08/27/2024.   Patient is aware of $0 copayment.  Disenrolling as this is a one time fill.

## 2024-08-26 NOTE — Telephone Encounter (Signed)
 Prior authorization for Furoscix  has been submitted and approved. Test billing returns $0 for 10 day supply.  Key: [id number] Effective: 07/16/2024 to 09/24/2024  Of note, pt last name for prior shara is Greg Eckrich with a zip code on [phone]. Future prior auths will need to be submitted as urgent if response is needed sooner than the standard 14 day window.  Rachel DEL, CPhT Rx Patient Advocate Phone: [phone]

## 2024-08-26 NOTE — Progress Notes (Signed)
 EPIC Encounter for ICM Monitoring  Patient Name: Brittany [name]  is a 63 y.o. female Date: 08/26/2024 Primary Care Physican: Tanda Bleacher, MD Primary Cardiologist: Bensimhon Electrophysiologist: Cindie 01/03/2024 Weight: 93-94 lbs  01/22/2024 Weight: 91 lbs (lowest weight 84 lbs) 04/18/2024 Office Weight:  99 lbs 05/27/2024 Weight: 87 lbs 07/14/2024 Weight: 89 lbs   Spoke with patient and heart failure questions reviewed.  Transmission results reviewed.  Pt went to hospital 9/8 due to she had difficulty breathing.     Optivol Thoracic impedance suggesting possible fluid accumulation 8/22 and returned to normal 9/10 (diuresed at hospital).    Prescribed: Torsemide  20 mg take 5 tablets (100 mg total) by mouth two times a day.  Potassium 20 mEq packet take 60 mEq total by mouth twice day with Furoscix  and extra 40 mEq when using Metolazone .   Spironolactone  50 mg take 1 tablet (50 mg total) by mouth daily Furosemide  (Furoscix ) 80 mg/10 ml inject into skin daily    Labs: 08/21/2024 Creatinine 2.25, BUN 68, Potassium 4.1, Sodium 133, GFR 24  08/20/2024 Creatinine 1.82, BUN 48, Potassium 4.0, Sodium 136, GFR 31  08/19/2024 Creatinine 1.62, BUN 32, Potassium 4.2, Sodium 137, GFR 35  08/18/2024 Creatinine 1.28, BUN 21, Potassium 3.4, Sodium 140, GFR 47 07/30/2024 Creatinine 1.34, BUN 29, Potassium 3.1, Sodium 140, GFR 45, BNP 1,826.2 07/20/2024 Creatinine 1.53, BUN 71, Potassium 3.2, Sodium 139, GFR 38  07/19/2024 Creatinine 1.57, BUN 62, Potassium 3.6, Sodium 135, GFR 37  07/18/2024 Creatinine 1.43, BUN 49, Potassium 4.3, Sodium 143, GFR 41  07/17/2024 Creatinine 1.27, BUN 36, Potassium 4.2, Sodium 138, GFR 48 07/16/2024 Creatinine 1.39, BUN 23, Potassium 4.4, Sodium 137, GFR 43  07/15/2024 Creatinine 0.79, BUN 14, Potassium 2.2, Sodium 146, GFR >60, BNP 1,303.1 A complete set of results can be found in Results Review.   Recommendations:  No changes and encouraged to call if  experiencing any fluid symptoms.   Follow-up plan: ICM clinic phone appointment on 09/08/2024.   91 day device clinic remote transmission 09/29/2024.     EP/Cardiology Office Visits:  08/28/2024 with HF Clinic.  Recall 07/26/2024 with [name], PA   Copy of ICM check sent to Dr. Cindie.      3 month ICM trend: 08/25/2024.    12-14 Month ICM trend:     [name], RN 08/26/2024 3:13 PM

## 2024-08-27 ENCOUNTER — Inpatient Hospital Stay: Admitting: Family Medicine

## 2024-08-27 ENCOUNTER — Telehealth (HOSPITAL_COMMUNITY): Payer: Self-pay

## 2024-08-27 ENCOUNTER — Other Ambulatory Visit: Payer: Self-pay

## 2024-08-27 NOTE — Telephone Encounter (Signed)
 Called to confirm/remind patient of their appointment at the [facility] on 08/28/24.   Appointment:   [x] Confirmed  [] Left mess   [] No answer/No voice mail  [] VM Full/unable to leave message  [] Phone not in service  Patient reminded to bring all medications and/or complete list.  Confirmed patient has transportation. Gave directions, instructed to utilize valet parking.

## 2024-08-28 ENCOUNTER — Other Ambulatory Visit: Payer: Self-pay

## 2024-08-28 ENCOUNTER — Other Ambulatory Visit (HOSPITAL_COMMUNITY): Payer: Self-pay

## 2024-08-28 ENCOUNTER — Encounter (HOSPITAL_COMMUNITY): Payer: Self-pay

## 2024-08-28 ENCOUNTER — Ambulatory Visit (HOSPITAL_COMMUNITY)
Admit: 2024-08-28 | Discharge: 2024-08-28 | Disposition: A | Discharge status: Home / Self Care | Attending: Family Medicine | Admitting: Family Medicine

## 2024-08-28 VITALS — BP 110/62 | HR 88 | Ht 59.5 in | Wt 86.6 lb

## 2024-08-28 DIAGNOSIS — I480 Paroxysmal atrial fibrillation: Secondary | ICD-10-CM | POA: Diagnosis not present

## 2024-08-28 DIAGNOSIS — F1290 Cannabis use, unspecified, uncomplicated: Secondary | ICD-10-CM | POA: Diagnosis not present

## 2024-08-28 DIAGNOSIS — R5381 Other malaise: Secondary | ICD-10-CM | POA: Insufficient documentation

## 2024-08-28 DIAGNOSIS — Z951 Presence of aortocoronary bypass graft: Secondary | ICD-10-CM | POA: Diagnosis not present

## 2024-08-28 DIAGNOSIS — I2510 Atherosclerotic heart disease of native coronary artery without angina pectoris: Secondary | ICD-10-CM

## 2024-08-28 DIAGNOSIS — Z87891 Personal history of nicotine dependence: Secondary | ICD-10-CM | POA: Diagnosis not present

## 2024-08-28 DIAGNOSIS — Z9221 Personal history of antineoplastic chemotherapy: Secondary | ICD-10-CM | POA: Insufficient documentation

## 2024-08-28 DIAGNOSIS — I130 Hypertensive heart and chronic kidney disease with heart failure and stage 1 through stage 4 chronic kidney disease, or unspecified chronic kidney disease: Secondary | ICD-10-CM | POA: Insufficient documentation

## 2024-08-28 DIAGNOSIS — Z955 Presence of coronary angioplasty implant and graft: Secondary | ICD-10-CM | POA: Diagnosis not present

## 2024-08-28 DIAGNOSIS — Z95810 Presence of automatic (implantable) cardiac defibrillator: Secondary | ICD-10-CM | POA: Diagnosis not present

## 2024-08-28 DIAGNOSIS — Z923 Personal history of irradiation: Secondary | ICD-10-CM | POA: Diagnosis not present

## 2024-08-28 DIAGNOSIS — I5084 End stage heart failure: Secondary | ICD-10-CM | POA: Insufficient documentation

## 2024-08-28 DIAGNOSIS — Z7901 Long term (current) use of anticoagulants: Secondary | ICD-10-CM | POA: Diagnosis not present

## 2024-08-28 DIAGNOSIS — J4489 Other specified chronic obstructive pulmonary disease: Secondary | ICD-10-CM | POA: Insufficient documentation

## 2024-08-28 DIAGNOSIS — R627 Adult failure to thrive: Secondary | ICD-10-CM

## 2024-08-28 DIAGNOSIS — R630 Anorexia: Secondary | ICD-10-CM | POA: Diagnosis not present

## 2024-08-28 DIAGNOSIS — I252 Old myocardial infarction: Secondary | ICD-10-CM | POA: Diagnosis not present

## 2024-08-28 DIAGNOSIS — I5022 Chronic systolic (congestive) heart failure: Secondary | ICD-10-CM

## 2024-08-28 DIAGNOSIS — Z681 Body mass index (BMI) 19 or less, adult: Secondary | ICD-10-CM | POA: Insufficient documentation

## 2024-08-28 DIAGNOSIS — Z79899 Other long term (current) drug therapy: Secondary | ICD-10-CM | POA: Insufficient documentation

## 2024-08-28 DIAGNOSIS — N1832 Chronic kidney disease, stage 3b: Secondary | ICD-10-CM

## 2024-08-28 DIAGNOSIS — Z85118 Personal history of other malignant neoplasm of bronchus and lung: Secondary | ICD-10-CM | POA: Diagnosis not present

## 2024-08-28 DIAGNOSIS — J9611 Chronic respiratory failure with hypoxia: Secondary | ICD-10-CM | POA: Diagnosis not present

## 2024-08-28 DIAGNOSIS — C3490 Malignant neoplasm of unspecified part of unspecified bronchus or lung: Secondary | ICD-10-CM

## 2024-08-28 DIAGNOSIS — Z66 Do not resuscitate: Secondary | ICD-10-CM | POA: Diagnosis not present

## 2024-08-28 DIAGNOSIS — N280 Ischemia and infarction of kidney: Secondary | ICD-10-CM

## 2024-08-28 LAB — BASIC METABOLIC PANEL WITH GFR
Anion gap: 11 (ref 5–15)
BUN: 43 mg/dL — ABNORMAL HIGH (ref 8–23)
CO2: 29 mmol/L (ref 22–32)
Calcium: 9.4 mg/dL (ref 8.9–10.3)
Chloride: 95 mmol/L — ABNORMAL LOW (ref 98–111)
Creatinine, Ser: 1.38 mg/dL — ABNORMAL HIGH (ref 0.44–1.00)
GFR, Estimated: 43 mL/min — ABNORMAL LOW (ref >60)
Glucose, Bld: 188 mg/dL — ABNORMAL HIGH (ref 70–99)
Potassium: 3.9 mmol/L (ref 3.5–5.1)
Sodium: 135 mmol/L (ref 135–145)

## 2024-08-28 LAB — CBC
HCT: 46.3 % — ABNORMAL HIGH (ref 36.0–46.0)
Hemoglobin: 15.2 g/dL — ABNORMAL HIGH (ref 12.0–15.0)
MCH: 27.8 pg (ref 26.0–34.0)
MCHC: 32.8 g/dL (ref 30.0–36.0)
MCV: 84.8 fL (ref 80.0–100.0)
Platelets: 242 K/uL (ref 150–400)
RBC: 5.46 MIL/uL — ABNORMAL HIGH (ref 3.87–5.11)
RDW: 18.4 % — ABNORMAL HIGH (ref 11.5–15.5)
WBC: 11.4 K/uL — ABNORMAL HIGH (ref 4.0–10.5)
nRBC: 0 % (ref 0.0–0.2)

## 2024-08-28 LAB — BRAIN NATRIURETIC PEPTIDE: B Natriuretic Peptide: 2271 pg/mL — ABNORMAL HIGH (ref 0.0–100.0)

## 2024-08-28 LAB — DIGOXIN LEVEL: Digoxin Level: 0.7 ng/mL — ABNORMAL LOW (ref 0.8–2.0)

## 2024-08-28 NOTE — Progress Notes (Signed)
 Patients furoscix  delivered to clinic from pharmacy, this was given to patient to take with her.

## 2024-08-28 NOTE — Patient Instructions (Signed)
 Medication Changes:  No Changes In Medications at this time.   Lab Work:  Labs done today, your results will be available in MyChart, we will contact you for abnormal readings.  Follow-Up in: 2 weeks as scheduled with APP clinic   At the [facility], you and your health needs are our priority. We have a designated team specialized in the treatment of Heart Failure. This Care Team includes your primary Heart Failure Specialized Cardiologist (physician), Advanced Practice Providers (APPs- Physician Assistants and Nurse Practitioners), and Pharmacist who all work together to provide you with the care you need, when you need it.   You may see any of the following providers on your designated Care Team at your next follow up:  Dr. Toribio Fuel Dr. Ezra Shuck Dr. Ria Commander Dr. Odis Brownie Greig Mosses, NP Caffie Shed, [place] [facility] [place], [place] Beckey Coe, NP [place] Lee, NP Tinnie Redman, PharmD   Please be sure to bring in all your medications bottles to every appointment.   Need to Contact Us :  If you have any questions or concerns before your next appointment please send us  a message through [facility] or call our office at [phone].    TO LEAVE A MESSAGE FOR THE NURSE SELECT OPTION 2, PLEASE LEAVE A MESSAGE INCLUDING: YOUR NAME DATE OF BIRTH CALL BACK NUMBER REASON FOR CALL**this is important as we prioritize the call backs  YOU WILL RECEIVE A CALL BACK THE SAME DAY AS LONG AS YOU CALL BEFORE 4:00 PM

## 2024-08-29 ENCOUNTER — Other Ambulatory Visit: Payer: Self-pay

## 2024-08-29 ENCOUNTER — Ambulatory Visit (HOSPITAL_COMMUNITY): Payer: Self-pay | Admitting: Family Medicine

## 2024-09-01 ENCOUNTER — Encounter

## 2024-09-02 ENCOUNTER — Encounter: Payer: Self-pay | Admitting: Family Medicine

## 2024-09-02 ENCOUNTER — Ambulatory Visit: Attending: Cardiology

## 2024-09-02 ENCOUNTER — Ambulatory Visit (INDEPENDENT_AMBULATORY_CARE_PROVIDER_SITE_OTHER): Admitting: Family Medicine

## 2024-09-02 VITALS — BP 102/65 | HR 82 | Ht 59.5 in | Wt 90.8 lb

## 2024-09-02 DIAGNOSIS — Z09 Encounter for follow-up examination after completed treatment for conditions other than malignant neoplasm: Secondary | ICD-10-CM

## 2024-09-02 DIAGNOSIS — Z789 Other specified health status: Secondary | ICD-10-CM

## 2024-09-02 DIAGNOSIS — I5043 Acute on chronic combined systolic (congestive) and diastolic (congestive) heart failure: Secondary | ICD-10-CM

## 2024-09-02 DIAGNOSIS — Z95810 Presence of automatic (implantable) cardiac defibrillator: Secondary | ICD-10-CM

## 2024-09-02 DIAGNOSIS — I5022 Chronic systolic (congestive) heart failure: Secondary | ICD-10-CM

## 2024-09-02 NOTE — Progress Notes (Signed)
 EPIC Encounter for ICM Monitoring  Patient Name: Brittany [name]  is a 63 y.o. female Date: 09/02/2024 Primary Care Physican: Tanda Bleacher, MD Primary Cardiologist: Bensimhon Electrophysiologist: Cindie 01/03/2024 Weight: 93-94 lbs  01/22/2024 Weight: 91 lbs (lowest weight 84 lbs) 04/18/2024 Office Weight:  99 lbs 05/27/2024 Weight: 87 lbs 07/14/2024 Weight: 89 lbs   Spoke with patient and heart failure questions reviewed.  Transmission results reviewed.     Pt reports following fluid symptoms:  Weight gain: None SOB:  Talking causes SOB which is audible during conversation Difficulty sleeping at night:  Yes, unable to lie down last night to sleep due to SOB  Swelling: None Feels like band that runs under her breast which is typically her sx of fluid   Optivol Thoracic impedance suggesting possible fluid accumulation starting 9/18.    Prescribed: Torsemide  20 mg take 5 tablets (100 mg total) by mouth two times a day.  Potassium 20 mEq packet take 60 mEq total by mouth twice day with Furoscix  and extra 40 mEq when using Metolazone .   Spironolactone  50 mg take 1 tablet (50 mg total) by mouth daily Furosemide  (Furoscix ) 80 mg/10 ml inject into skin daily    Labs: 08/28/2024 Creatinine 1.38, BUN 43, Potassium 3.9, Sodium 135, GFR 43, BNP 2,271.0 08/21/2024 Creatinine 2.25, BUN 68, Potassium 4.1, Sodium 133, GFR 24  08/20/2024 Creatinine 1.82, BUN 48, Potassium 4.0, Sodium 136, GFR 31  08/19/2024 Creatinine 1.62, BUN 32, Potassium 4.2, Sodium 137, GFR 35  08/18/2024 Creatinine 1.28, BUN 21, Potassium 3.4, Sodium 140, GFR 47 07/30/2024 Creatinine 1.34, BUN 29, Potassium 3.1, Sodium 140, GFR 45, BNP 1,826.2 07/20/2024 Creatinine 1.53, BUN 71, Potassium 3.2, Sodium 139, GFR 38  07/19/2024 Creatinine 1.57, BUN 62, Potassium 3.6, Sodium 135, GFR 37  07/18/2024 Creatinine 1.43, BUN 49, Potassium 4.3, Sodium 143, GFR 41  07/17/2024 Creatinine 1.27, BUN 36, Potassium 4.2, Sodium 138, GFR  48 07/16/2024 Creatinine 1.39, BUN 23, Potassium 4.4, Sodium 137, GFR 43  07/15/2024 Creatinine 0.79, BUN 14, Potassium 2.2, Sodium 146, GFR >60, BNP 1,303.1 A complete set of results can be found in Results Review.   Recommendations:    No Torsemide  this morning, 9/23 since she had PCP appointment but started Furoscix  dose today at 11:00 AM, 9/23.  Advised pt to use 911 if needed for fluid symptoms.   Follow-up plan: ICM clinic phone appointment on 09/08/2024.   91 day device clinic remote transmission 09/29/2024.     EP/Cardiology Office Visits:  09/09/2024 with HF Clinic.  Recall 07/26/2024 with [name], PA   Copy of ICM check sent to Dr. Cindie.    Sent to [name], NP for review and recommendations if needed.   3 month ICM trend: 09/02/2024.    12-14 Month ICM trend:     [name], RN 09/02/2024 11:53 AM

## 2024-09-02 NOTE — Progress Notes (Unsigned)
 Established Patient Office Visit  Subjective    Patient ID: Brittany [name] , female    DOB: 01/13/1961  Age: 63 y.o. MRN: [id number]  CC:  Chief Complaint  Patient presents with   Hospitalization Follow-up    Pt says her heart and vascular PA is concerned about her weight.     HPI Brittany [name]  presents for routine hospital discharge follow up. Patient was seen for acute on chronic end-stage systolic heart failure. Patient reports that they would like her to gain some weight.   Outpatient Encounter Medications as of 09/02/2024  Medication Sig   albuterol  (VENTOLIN  HFA) 108 (90 Base) MCG/ACT inhaler Inhale 2 puffs into the lungs every 6 (six) hours as needed for wheezing or shortness of breath.   apixaban  (ELIQUIS ) 2.5 MG TABS tablet Take 1 tablet (2.5 mg total) by mouth 2 (two) times daily.   atorvastatin  (LIPITOR ) 80 MG tablet Take 1 tablet (80 mg total) by mouth daily.   cyclobenzaprine  (FLEXERIL ) 10 MG tablet Take 1 tablet (10 mg total) by mouth 3 (three) times daily as needed for muscle spasms.   dapagliflozin  propanediol (FARXIGA ) 10 MG TABS tablet Take 1 tablet (10 mg total) by mouth daily.   Digoxin  62.5 MCG TABS Take 0.0625 mg by mouth daily.   Dihydroxyaluminum Sod Carb (ROLAIDS PO) Take 1 tablet by mouth as needed.   Furosemide  (FUROSCIX ) 80 MG/10ML CTKT Inject 80 mg into the skin daily as needed (fluid and edema). Take as directed by the heart failure clinic only   hydrOXYzine  (VISTARIL ) 25 MG capsule TAKE 1 CAPSULE (25 MG TOTAL) BY MOUTH DAILY AS NEEDED.   ipratropium-albuterol  (DUONEB) 0.5-2.5 (3) MG/3ML SOLN TAKE 3 MLS BY NEBULIZATION EVERY 4 (FOUR) HOURS AS NEEDED (FOR SHORTNESS OF BREATH).   ivabradine  (CORLANOR ) 7.5 MG TABS tablet TAKE 1 TABLET BY MOUTH TWICE A DAY WITH FOOD   megestrol  (MEGACE ) 20 MG tablet Take 1 tablet (20 mg total) by mouth 2 (two) times daily.   metolazone  (ZAROXOLYN ) 5 MG tablet Take 1 tablet (5 mg total) by mouth as needed (as  instructed by [facility]).   Multiple Vitamin (MULTIVITAMIN WITH MINERALS) TABS tablet Take 1 tablet by mouth daily. Gummies   nitroGLYCERIN  (NITROSTAT ) 0.4 MG SL tablet Place 1 tablet (0.4 mg total) under the tongue every 5 (five) minutes as needed for chest pain.   Nutritional Supplements (ENSURE ORIGINAL) LIQD Take 1 Bottle by mouth 2 (two) times daily as needed (poor appetite).   pantoprazole  (PROTONIX ) 40 MG tablet Take 1 tablet (40 mg total) by mouth daily at 6 (six) AM.   potassium chloride  (KLOR-CON ) 20 MEQ packet Take 80 mEq by mouth 2 (two) times daily. TAKE 80 MEQ TOTAL TWICE A DAY EXCEPT ON METOLAZONE  DAYS OF TUESDAYS TAKE (100 MEQ) 3 TIMES PER DAY.   spironolactone  (ALDACTONE ) 25 MG tablet Take 1 tablet (25 mg total) by mouth daily.   torsemide  (DEMADEX ) 20 MG tablet Take 5 tablets (100 mg total) by mouth 2 (two) times daily.   No facility-administered encounter medications on file as of 09/02/2024.    Past Medical History:  Diagnosis Date   Acute respiratory failure (HCC) 05/05/2018   Acute systolic congestive heart failure (HCC) 02/03/2018   AICD (automatic cardioverter/defibrillator) present    Allergy    Anxiety    Asthma    DM2 (diabetes mellitus, type 2) (HCC) 10/20/2020   Hypertension    PAF (paroxysmal atrial fibrillation) (HCC)    Presence of permanent cardiac pacemaker  Prophylactic measure 08/03/14-08/19/14   Prophyl. 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   LIMA to LAD, SVG to D1, SVG to OM1, EVH via right thigh with implantation of Impella LD LVAD via direct aortic approach   Small cell lung cancer (HCC) 03/16/2014    Past Surgical History:  Procedure Laterality Date   BRONCHIAL BRUSHINGS  10/25/2020   Procedure: BRONCHIAL BRUSHINGS;  Surgeon: Shelah Lamar RAMAN, MD;  Location: [facility] ENDOSCOPY;  Service: Pulmonary;;   BRONCHIAL NEEDLE ASPIRATION BIOPSY  10/25/2020   Procedure: BRONCHIAL NEEDLE ASPIRATION BIOPSIES;  Surgeon: Shelah Lamar RAMAN, MD;  Location: [facility] ENDOSCOPY;  Service: Pulmonary;;   CARDIAC DEFIBRILLATOR PLACEMENT  08/15/2018   MDT Visia AF MRI VR ICD implanted by Dr Zulma for primary prevention of sudden   CESAREAN SECTION     CORONARY ARTERY BYPASS GRAFT N/A 02/03/2018   Procedure: CORONARY ARTERY BYPASS GRAFTING (CABG);  Surgeon: Dusty Sudie DEL, MD;  Location: [facility] OR;  Service: Open Heart Surgery;  Laterality: N/A;  Time 3 using left internal mammary artery and endoscopically harvested right saphenous vein   CORONARY BALLOON ANGIOPLASTY N/A 02/03/2018   Procedure: CORONARY BALLOON ANGIOPLASTY;  Surgeon: Swaziland, Peter M, MD;  Location: [facility] INVASIVE CV LAB;  Service: Cardiovascular;  Laterality: N/A;   CORONARY STENT INTERVENTION N/A 02/12/2019   Procedure: CORONARY STENT INTERVENTION;  Surgeon: Darron Deatrice LABOR, MD;  Location: [facility] INVASIVE CV LAB;  Service: Cardiovascular;  Laterality: N/A;   CORONARY/GRAFT ACUTE MI REVASCULARIZATION N/A 02/03/2018   Procedure: Coronary/Graft Acute MI Revascularization;  Surgeon: Swaziland, Peter M, MD;  Location: [facility] INVASIVE CV LAB;  Service: Cardiovascular;  Laterality: N/A;   ENDOBRONCHIAL ULTRASOUND N/A 10/25/2020   Procedure: ENDOBRONCHIAL ULTRASOUND;  Surgeon: Shelah Lamar RAMAN, MD;  Location: [facility] ENDOSCOPY;  Service: Pulmonary;  Laterality: N/A;   FLEXIBLE BRONCHOSCOPY  10/25/2020   Procedure: FLEXIBLE BRONCHOSCOPY;  Surgeon: Shelah Lamar RAMAN, MD;  Location: [facility] ENDOSCOPY;  Service: Pulmonary;;   IABP INSERTION N/A 02/03/2018   Procedure: IABP Insertion;  Surgeon: Swaziland, Peter M, MD;  Location: [facility] INVASIVE CV LAB;  Service: Cardiovascular;  Laterality: N/A;   INTRAOPERATIVE TRANSESOPHAGEAL ECHOCARDIOGRAM N/A 02/03/2018   Procedure: INTRAOPERATIVE TRANSESOPHAGEAL ECHOCARDIOGRAM;  Surgeon: Dusty Sudie DEL, MD;  Location: [facility] OR;  Service: Open Heart Surgery;  Laterality: N/A;   LEFT HEART CATH AND CORONARY ANGIOGRAPHY N/A 02/03/2018   Procedure: LEFT HEART CATH AND CORONARY  ANGIOGRAPHY;  Surgeon: Swaziland, Peter M, MD;  Location: [facility] INVASIVE CV LAB;  Service: Cardiovascular;  Laterality: N/A;   LEFT HEART CATH AND CORS/GRAFTS ANGIOGRAPHY N/A 02/12/2019   Procedure: LEFT HEART CATH AND CORS/GRAFTS ANGIOGRAPHY;  Surgeon: Darron Deatrice LABOR, MD;  Location: [facility] INVASIVE CV LAB;  Service: Cardiovascular;  Laterality: N/A;   MEDIASTINOSCOPY N/A 03/11/2014   Procedure: MEDIASTINOSCOPY;  Surgeon: Elspeth JAYSON Millers, MD;  Location: [facility] OR;  Service: Thoracic;  Laterality: N/A;   PLACEMENT OF IMPELLA LEFT VENTRICULAR ASSIST DEVICE  02/03/2018   Procedure: PLACEMENT OF IMPELLA LEFT VENTRICULAR ASSIST DEVICE LD;  Surgeon: Dusty Sudie DEL, MD;  Location: [facility] OR;  Service: Open Heart Surgery;;   REMOVAL OF IMPELLA LEFT VENTRICULAR ASSIST DEVICE N/A 02/08/2018   Procedure: REMOVAL OF IMPELLA LEFT VENTRICULAR ASSIST DEVICE;  Surgeon: Dusty Sudie DEL, MD;  Location: [facility] OR;  Service: Open Heart Surgery;  Laterality: N/A;   RIGHT HEART CATH N/A 02/03/2018   Procedure: RIGHT HEART CATH;  Surgeon: Swaziland, Peter M, MD;  Location: [facility] INVASIVE CV LAB;  Service: Cardiovascular;  Laterality: N/A;   RIGHT HEART CATH N/A 05/09/2018   Procedure: RIGHT HEART CATH;  Surgeon: Cherrie Toribio SAUNDERS, MD;  Location: [facility] INVASIVE CV LAB;  Service: Cardiovascular;  Laterality: N/A;   TEE WITHOUT CARDIOVERSION N/A 02/08/2018   Procedure: TRANSESOPHAGEAL ECHOCARDIOGRAM (TEE);  Surgeon: Dusty Sudie DEL, MD;  Location: [facility] OR;  Service: Open Heart Surgery;  Laterality: N/A;   TUBAL LIGATION     VIDEO BRONCHOSCOPY WITH ENDOBRONCHIAL ULTRASOUND N/A 03/11/2014   Procedure: VIDEO BRONCHOSCOPY WITH ENDOBRONCHIAL ULTRASOUND;  Surgeon: Elspeth JAYSON Millers, MD;  Location: [facility] OR;  Service: Thoracic;  Laterality: N/A;    Family History  Problem Relation Age of Onset   Heart disease Mother    Hypertension Mother    Heart attack Mother    Hypertension Maternal Grandmother    Cancer Maternal Grandmother    Diabetes Paternal  Grandmother    Stroke Neg Hx     Social History   Socioeconomic History   Marital status: Married    Spouse name: Keven Washington     Number of children: 1   Years of education: Not on file   Highest education level: Not on file  Occupational History   Occupation: disabled  Tobacco Use   Smoking status: Former    Current packs/day: 0.00    Average packs/day: 1 pack/day for 20.0 years (20.0 ttl pk-yrs)    Types: Cigarettes    Start date: 03/13/1994    Quit date: 03/13/2014    Years since quitting: 10.4   Smokeless tobacco: Never  Vaping Use   Vaping status: Never Used  Substance and Sexual Activity   Alcohol use: Yes    Alcohol/week: 5.0 standard drinks of alcohol    Types: 5 Glasses of wine per week    Comment: a few glasses of wine weekly   Drug use: Yes    Types: Marijuana    Comment: medicinal, not daily   Sexual activity: Never  Other Topics Concern   Not on file  Social History Narrative   Not on file   Social Drivers of Health   Financial Resource Strain: Medium Risk (02/11/2021)   Overall Financial Resource Strain (CARDIA)    Difficulty of Paying Living Expenses: Somewhat hard  Food Insecurity: No Food Insecurity (08/18/2024)   Hunger Vital Sign    Worried About Running Out of Food in the Last Year: Never true    Ran Out of Food in the Last Year: Never true  Transportation Needs: No Transportation Needs (08/18/2024)   PRAPARE - Administrator, Civil Service (Medical): No    Lack of Transportation (Non-Medical): No  Physical Activity: Insufficiently Active (08/14/2024)   Exercise Vital Sign    Days of Exercise per Week: 2 days    Minutes of Exercise per Session: 20 min  Stress: Stress Concern Present (02/11/2021)   [employer] of Occupational Health - Occupational Stress Questionnaire    Feeling of Stress : To some extent  Social Connections: Moderately Isolated (08/18/2024)   Social Connection and Isolation Panel    Frequency of Communication  with Friends and Family: More than three times a week    Frequency of Social Gatherings with Friends and Family: Never    Attends Religious Services: Never    Database administrator or Organizations: No    Attends Banker Meetings: Never    Marital Status: Married  Catering manager Violence: Not At Risk (08/18/2024)   Humiliation, Afraid, Rape, and Kick  questionnaire    Fear of Current or Ex-Partner: No    Emotionally Abused: No    Physically Abused: No    Sexually Abused: No    Review of Systems  Constitutional:  Negative for chills and fever.  Respiratory:  Positive for shortness of breath and wheezing.   Cardiovascular:  Negative for chest pain and leg swelling.  All other systems reviewed and are negative.       Objective    BP 102/65   Pulse 82   Ht 4' 11.5 (1.511 m)   Wt 90 lb 12.8 oz (41.2 kg)   SpO2 98%   BMI 18.03 kg/m   Physical Exam Vitals and nursing note reviewed.  Constitutional:      General: She is not in acute distress. Cardiovascular:     Rate and Rhythm: Normal rate and regular rhythm.  Pulmonary:     Effort: Pulmonary effort is normal.     Breath sounds: Wheezing present.  Abdominal:     Palpations: Abdomen is soft.     Tenderness: There is no abdominal tenderness.  Neurological:     General: No focal deficit present.     Mental Status: She is alert and oriented to person, place, and time.         Assessment & Plan:   Acute on chronic combined systolic and diastolic CHF (congestive heart failure) (HCC)  Weight gain advised  Hospital discharge follow-up     Return in about 3 months (around 12/02/2024).   Tanda Raguel SQUIBB, MD

## 2024-09-02 NOTE — Progress Notes (Signed)
  Received: Today [place], Harlene HERO, FNP  Gitel Beste, Mitzie RAMAN, RN Please have her told her torsemide /metolazone  and use Furoscix  daily + 40 KCL daily x 3 days After 3 days resume torsemide  100 mg bid. She will need a BMET in 1 week

## 2024-09-02 NOTE — Progress Notes (Signed)
 Spoke with patient and advised [name], NP at HF clinic recommended to hold torsemide /metolazone  and use Furoscix  daily + 40 KCL daily x 3 days.  Advised today's dose would be her 1 of 3 doses.  After the 3 days of Furoscix , resume Torsemide  100 mg bid.  BMET in a week and patient is already scheduled HF clinic OV on 9/30 and labs will be drawn.  She reports she is already have relief of fluid symptoms since she started the Furoscix  today.  Breathing has improved and feeling better.  Advised to use 911 if sx become worse and she agreed to plan.

## 2024-09-04 ENCOUNTER — Encounter: Payer: Self-pay | Admitting: Family Medicine

## 2024-09-05 NOTE — Progress Notes (Incomplete)
 [facility]   PCP:  Tanda Bleacher, MD  Oncology: Dr. Sherrod  HF Cardiologist: Dr Cherrie  HPI: Brittany [name]  is a 63 y.o. 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 Underwent emergent cath 02/03/18 showed LAD 100% stenosed, LCx 95% stenosed. Taken for emergent CABG 02/03/18. Required impella post op. Hospital course complicated by cardiogenic shock, HCAP, A fib, respiratory failure, and swallowing issues. She was discharged to SNF. Discharge weight 103 pounds.    In 2019 had multiple hospitalizations for HF and pleural effusion. Underwent pleurodesis at [facility].   Admitted 3/20 with NSTEMI and HF. Received DES to ostial ramus into distal left main based on cath below and underwent diuresis. Meds adjusted as tolerated. Echo with EF 25-30%.    Admitted twice in 2023 with low output requiring milrinone . Barostim placed.   Multiple CHF and AECOPD admission in May, August, October of 2024 and Janurary 2025. Echo 1/25 EF  < 20%, G2DD, RV moderately to severely reduced. She remained DNR.  Admitted 3/25 for CHF and AECOPD. Readmitted 4/25 for CHF/COPD. Was hypotensive. Moved to ICU. Required DBA and IV diuresis. Refused [facility] services.   Admitted 8/25 with a/c hypoxic respiratory failure and acute on chronic CHF. She required inotrope support with DBA which was wean off prior to discharge.   Remote ICD interrogation 08/18 indicated fluid accumulation since 08/12. We attempted to arrange for Furoscix  08/19 but she was unable to get to clinic to pick up the kit. She did use 5 mg metolazone .  Admitted 9/25 with a/c HF. Diuresed with IV lasix  and transiently required BiPap. GDMT titrated  but limited by AKI. She was discharged home, weight 86 lbs.  Today she returns for post hospital HF follow up. Overall feeling better, breathing has improved. Appetite poor. Denies palpitations, abnormal bleeding, CP, dizziness, edema, or PND/Orthopnea. Appetite ok. Weight at home 87 pounds. Taking all medications.     Past Medical History:  Diagnosis Date   Acute respiratory failure (HCC) 05/05/2018   Acute systolic congestive heart failure (HCC) 02/03/2018   AICD (automatic cardioverter/defibrillator) present    Allergy    Anxiety    Asthma    DM2 (diabetes mellitus, type 2) (HCC) 10/20/2020   Hypertension    PAF (paroxysmal atrial fibrillation) (HCC)    Presence of permanent cardiac pacemaker    Prophylactic measure 08/03/14-08/19/14   Prophyl. 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   LIMA to LAD, SVG to D1, SVG to OM1, EVH via right thigh with implantation of Impella LD LVAD via direct aortic approach   Small cell lung cancer (HCC) 03/16/2014   Past Surgical History:  Procedure Laterality Date   BRONCHIAL BRUSHINGS  10/25/2020   Procedure: BRONCHIAL BRUSHINGS;  Surgeon: Shelah Lamar RAMAN, MD;  Location: [facility] ENDOSCOPY;  Service: Pulmonary;;   BRONCHIAL NEEDLE ASPIRATION BIOPSY  10/25/2020   Procedure: BRONCHIAL NEEDLE ASPIRATION BIOPSIES;  Surgeon: Shelah Lamar RAMAN, MD;  Location: [facility] ENDOSCOPY;  Service: Pulmonary;;   CARDIAC DEFIBRILLATOR PLACEMENT  08/15/2018   MDT Visia AF MRI VR ICD implanted by Dr Zulma for primary prevention of sudden   CESAREAN SECTION     CORONARY ARTERY BYPASS  GRAFT N/A 02/03/2018   Procedure: CORONARY ARTERY BYPASS GRAFTING (CABG);  Surgeon: Dusty Sudie DEL, MD;  Location: [facility] OR;  Service: Open Heart Surgery;  Laterality: N/A;  Time 3 using left internal mammary artery and endoscopically harvested right saphenous vein   CORONARY BALLOON ANGIOPLASTY N/A 02/03/2018   Procedure: CORONARY BALLOON ANGIOPLASTY;  Surgeon: Swaziland, Peter M, MD;   Location: [facility] INVASIVE CV LAB;  Service: Cardiovascular;  Laterality: N/A;   CORONARY STENT INTERVENTION N/A 02/12/2019   Procedure: CORONARY STENT INTERVENTION;  Surgeon: Darron Deatrice LABOR, MD;  Location: [facility] INVASIVE CV LAB;  Service: Cardiovascular;  Laterality: N/A;   CORONARY/GRAFT ACUTE MI REVASCULARIZATION N/A 02/03/2018   Procedure: Coronary/Graft Acute MI Revascularization;  Surgeon: Swaziland, Peter M, MD;  Location: [facility] INVASIVE CV LAB;  Service: Cardiovascular;  Laterality: N/A;   ENDOBRONCHIAL ULTRASOUND N/A 10/25/2020   Procedure: ENDOBRONCHIAL ULTRASOUND;  Surgeon: Shelah Lamar RAMAN, MD;  Location: [facility] ENDOSCOPY;  Service: Pulmonary;  Laterality: N/A;   FLEXIBLE BRONCHOSCOPY  10/25/2020   Procedure: FLEXIBLE BRONCHOSCOPY;  Surgeon: Shelah Lamar RAMAN, MD;  Location: [facility] ENDOSCOPY;  Service: Pulmonary;;   IABP INSERTION N/A 02/03/2018   Procedure: IABP Insertion;  Surgeon: Swaziland, Peter M, MD;  Location: [facility] INVASIVE CV LAB;  Service: Cardiovascular;  Laterality: N/A;   INTRAOPERATIVE TRANSESOPHAGEAL ECHOCARDIOGRAM N/A 02/03/2018   Procedure: INTRAOPERATIVE TRANSESOPHAGEAL ECHOCARDIOGRAM;  Surgeon: Dusty Sudie DEL, MD;  Location: [facility] OR;  Service: Open Heart Surgery;  Laterality: N/A;   LEFT HEART CATH AND CORONARY ANGIOGRAPHY N/A 02/03/2018   Procedure: LEFT HEART CATH AND CORONARY ANGIOGRAPHY;  Surgeon: Swaziland, Peter M, MD;  Location: [facility] INVASIVE CV LAB;  Service: Cardiovascular;  Laterality: N/A;   LEFT HEART CATH AND CORS/GRAFTS ANGIOGRAPHY N/A 02/12/2019   Procedure: LEFT HEART CATH AND CORS/GRAFTS ANGIOGRAPHY;  Surgeon: Darron Deatrice LABOR, MD;  Location: [facility] INVASIVE CV LAB;  Service: Cardiovascular;  Laterality: N/A;   MEDIASTINOSCOPY N/A 03/11/2014   Procedure: MEDIASTINOSCOPY;  Surgeon: Elspeth JAYSON Millers, MD;  Location: [facility] OR;  Service: Thoracic;  Laterality: N/A;   PLACEMENT OF IMPELLA LEFT VENTRICULAR ASSIST DEVICE  02/03/2018   Procedure: PLACEMENT OF IMPELLA LEFT VENTRICULAR ASSIST DEVICE LD;   Surgeon: Dusty Sudie DEL, MD;  Location: [facility] OR;  Service: Open Heart Surgery;;   REMOVAL OF IMPELLA LEFT VENTRICULAR ASSIST DEVICE N/A 02/08/2018   Procedure: REMOVAL OF IMPELLA LEFT VENTRICULAR ASSIST DEVICE;  Surgeon: Dusty Sudie DEL, MD;  Location: [facility] OR;  Service: Open Heart Surgery;  Laterality: N/A;   RIGHT HEART CATH N/A 02/03/2018   Procedure: RIGHT HEART CATH;  Surgeon: Swaziland, Peter M, MD;  Location: [facility] INVASIVE CV LAB;  Service: Cardiovascular;  Laterality: N/A;   RIGHT HEART CATH N/A 05/09/2018   Procedure: RIGHT HEART CATH;  Surgeon: Cherrie Toribio SAUNDERS, MD;  Location: [facility] INVASIVE CV LAB;  Service: Cardiovascular;  Laterality: N/A;   TEE WITHOUT CARDIOVERSION N/A 02/08/2018   Procedure: TRANSESOPHAGEAL ECHOCARDIOGRAM (TEE);  Surgeon: Dusty Sudie DEL, MD;  Location: [facility] OR;  Service: Open Heart Surgery;  Laterality: N/A;   TUBAL LIGATION     VIDEO BRONCHOSCOPY WITH ENDOBRONCHIAL ULTRASOUND N/A 03/11/2014   Procedure: VIDEO BRONCHOSCOPY WITH ENDOBRONCHIAL ULTRASOUND;  Surgeon: Elspeth JAYSON Millers, MD;  Location: [facility] OR;  Service: Thoracic;  Laterality: N/A;   Current Outpatient Medications  Medication Sig Dispense Refill   albuterol  (VENTOLIN  HFA) 108 (90 Base) MCG/ACT inhaler Inhale 2 puffs into the lungs every 6 (six) hours as needed for wheezing or shortness of breath. 8.5 g 1  apixaban  (ELIQUIS ) 2.5 MG TABS tablet Take 1 tablet (2.5 mg total) by mouth 2 (two) times daily. 180 tablet 1   atorvastatin  (LIPITOR ) 80 MG tablet Take 1 tablet (80 mg total) by mouth daily. 90 tablet 0   cyclobenzaprine  (FLEXERIL ) 10 MG tablet Take 1 tablet (10 mg total) by mouth 3 (three) times daily as needed for muscle spasms. 60 tablet 3   dapagliflozin  propanediol (FARXIGA ) 10 MG TABS tablet Take 1 tablet (10 mg total) by mouth daily. 30 tablet 5   Digoxin  62.5 MCG TABS Take 0.0625 mg by mouth daily.     Dihydroxyaluminum Sod Carb (ROLAIDS PO) Take 1 tablet by mouth as needed.     Furosemide  (FUROSCIX ) 80  MG/10ML CTKT Inject 80 mg into the skin daily as needed (fluid and edema). Take as directed by the heart failure clinic only 10 each 1   hydrOXYzine  (VISTARIL ) 25 MG capsule TAKE 1 CAPSULE (25 MG TOTAL) BY MOUTH DAILY AS NEEDED. 90 capsule 1   ipratropium-albuterol  (DUONEB) 0.5-2.5 (3) MG/3ML SOLN TAKE 3 MLS BY NEBULIZATION EVERY 4 (FOUR) HOURS AS NEEDED (FOR SHORTNESS OF BREATH). 180 mL 3   ivabradine  (CORLANOR ) 7.5 MG TABS tablet TAKE 1 TABLET BY MOUTH TWICE A DAY WITH FOOD 120 tablet 1   megestrol  (MEGACE ) 20 MG tablet Take 1 tablet (20 mg total) by mouth 2 (two) times daily. 60 tablet 1   metolazone  (ZAROXOLYN ) 5 MG tablet Take 1 tablet (5 mg total) by mouth as needed (as instructed by Advanced HF clinic). 10 tablet 3   Multiple Vitamin (MULTIVITAMIN WITH MINERALS) TABS tablet Take 1 tablet by mouth daily. Gummies     nitroGLYCERIN  (NITROSTAT ) 0.4 MG SL tablet Place 1 tablet (0.4 mg total) under the tongue every 5 (five) minutes as needed for chest pain. 90 tablet 0   Nutritional Supplements (ENSURE ORIGINAL) LIQD Take 1 Bottle by mouth 2 (two) times daily as needed (poor appetite).     pantoprazole  (PROTONIX ) 40 MG tablet Take 1 tablet (40 mg total) by mouth daily at 6 (six) AM. 5 tablet 0   potassium chloride  (KLOR-CON ) 20 MEQ packet Take 80 mEq by mouth 2 (two) times daily. TAKE 80 MEQ TOTAL TWICE A DAY EXCEPT ON METOLAZONE  DAYS OF TUESDAYS TAKE (100 MEQ) 3 TIMES PER DAY. 360 packet 2   spironolactone  (ALDACTONE ) 25 MG tablet Take 1 tablet (25 mg total) by mouth daily.     torsemide  (DEMADEX ) 20 MG tablet Take 5 tablets (100 mg total) by mouth 2 (two) times daily.     No current facility-administered medications for this visit.    Allergies:   Lactose intolerance (gi) and Codeine   Social History:  The patient  reports that she quit smoking about 10 years ago. Her smoking use included cigarettes. She started smoking about 30 years ago. She has a 20 pack-year smoking history. She has never  used smokeless tobacco. She reports current alcohol use of about 5.0 standard drinks of alcohol per week. She reports current drug use. Drug: Marijuana.   Family History:  The patient's family history includes Cancer in her maternal grandmother; Diabetes in her paternal grandmother; Heart attack in her mother; Heart disease in her mother; Hypertension in her maternal grandmother and mother.   ROS:  Please see the history of present illness.   All other systems are personally reviewed and negative.    Wt Readings from Last 3 Encounters:  09/02/24 41.2 kg (90 lb 12.8 oz)  08/28/24 39.3 kg (  86 lb 9.6 oz)  08/21/24 39.4 kg (86 lb 13.8 oz)   There were no vitals taken for this visit.  Physical Exam General:  NAD. No resp difficulty, walked into clinic, thin HEENT: Normal Neck: Supple. No JVD. Cor: Regular rate & rhythm. No rubs, gallops or murmurs. Lungs: Faint wheezes in lower lobes Abdomen: Soft, nontender, nondistended.  Extremities: No cyanosis, clubbing, rash, edema Neuro: Alert & oriented x 3, moves all 4 extremities w/o difficulty. Affect pleasant.  Device interrogation (personally reviewed): OptiVol and thoracic impedence OK, no VT, no AT/AF, 0.6 hr/day activity, < 0.1% VP  ECG (personally reviewed): barostim artifact  Assessment & Plan: 1. End-stage Chronic Systolic Heart Failure - EF has been 20-25% for the last 5 years.  - S/p Medtronic ICD and Barostim - Echo 1/25: EF < 20%, mod to severely reduced RV (in setting of RSV) - Numerous hospitalizations over past 18 months  - NYHA III, functional status confounded by COPD. Volume ok on exam and device - Given Furoscix  today. Suspect she will need a dose every 10-14 days.  - Will ask device RN to send transmission in 1 week - Continue digoxin  0.0625 mg daily - Continue torsemide  100 mg bid. - Continue Farxiga  10 mg daily. - Continue spironolactone  25 mg daily. - Continue ivabradine  7.5 mg bid. - Not a candidate for advanced  therapies due to severity of lung disease.  - Labs today   2. Chronic Hypoxic Respiratory Failure/COPD  - Hx frequent admissions for AE COPD + A/C HFrEF.  - Improved with diuresis    3. CKD IIIb - Creatinine baseline ~ 1.4-1.7.  - Continue Farxiga  10 mg daily - Labs today   4. PAF  - Regular on exam today. - Continue Eliquis  2.5 mg bid, reduced dose (weight/creatinine). No bleeding issues - CBC today.   5. CAD - History of CABG x 2 2019 - s/p DES ostial ramus extending to left main (2020) - No CP - No ASA with need for AC - Continue atorvastatin  80 mg daily  6. H/o SCLC - Completed treatment 2015.  - CT chest (5/24) soft tissue thickening along mediastinum but no discrete mass    7. H/o Renal Infarct 2022 - On Eliquis .  - No bleeding issues.   8. GOC/FTT - She is DNR/DNI   9. Deconditioning - Needing some assistance with ADLs. - She has declined HH or PT/OT.  Follow up in 2 weeks with APP. Will ask Mitzie Garner to send device interrogation in 1 week. Hopefully we can break frequent HF admission cycle with close follow ups.  [name], FNP-BC 09/05/24

## 2024-09-08 ENCOUNTER — Telehealth (HOSPITAL_COMMUNITY): Payer: Self-pay

## 2024-09-08 ENCOUNTER — Ambulatory Visit: Attending: Cardiology

## 2024-09-08 DIAGNOSIS — I5022 Chronic systolic (congestive) heart failure: Secondary | ICD-10-CM

## 2024-09-08 DIAGNOSIS — Z95810 Presence of automatic (implantable) cardiac defibrillator: Secondary | ICD-10-CM

## 2024-09-08 NOTE — Telephone Encounter (Signed)
 Called to confirm/remind patient of their appointment at the [facility] on 09/09/24. However, patient needed to reschedule for another day.  Pt's new appointment has been scheduled.

## 2024-09-08 NOTE — Progress Notes (Signed)
  Received: Today [name], Harlene HERO, FNP  Lindell Tussey, Mitzie RAMAN, RN Would try Furoscix  BID to 40 KCL bid to see if she can get some relief.   Received: Today [name], Harlene HERO, FNP  Daly Whipkey, Mitzie RAMAN, RN Yes let's try 1 day. If no better recommend ED eval  Message sent back to [facility] asking if patient should take Torsemide  dosages when taking Furoscix  and how may hours between the two Furoscix  dosages in a day.

## 2024-09-08 NOTE — Progress Notes (Signed)
 Spoke with patient.  Advised of Brittany [name]'s recommendation to try Furoscix  twice a day with 40 KCL with each dosage.    Advised to hold Torsemide  dosages when taking Furoscix  and make sure she takes extra 40 mEq of potassium with each Furoscix  dosage.  Advised to wait a minimum of 8 hours between Furoscix  dosages.  She verbalizes understanding.  She has 3 defective Furoscix  injections and only 2 left.    She will use ED if needed tonight.

## 2024-09-08 NOTE — Progress Notes (Signed)
 EPIC Encounter for ICM Monitoring  Patient Name: Brittany [name]  is a 63 y.o. female Date: 09/08/2024 Primary Care Physican: Tanda Bleacher, MD Primary Cardiologist: Bensimhon Electrophysiologist: Cindie 01/03/2024 Weight: 93-94 lbs  01/22/2024 Weight: 91 lbs (lowest weight 84 lbs) 04/18/2024 Office Weight:  99 lbs 05/27/2024 Weight: 87 lbs 07/14/2024 Weight: 89 lbs   Spoke with patient and heart failure questions reviewed.  Transmission results reviewed.     Pt reports following fluid symptoms:  Weight gain: None SOB:  Continues to have SOB Difficulty sleeping at night:  Yes, unable to lie down at night to sleep due to feeling shortness of breath and can't get her breath.  She feels like she cannot go another night without sleeping and she has used her inhalers which do not seem to be working.   Swelling: None   Since 09/02/2024 ICM Remote Transmission, Optivol Thoracic impedance suggesting possible fluid accumulation starting 08/28/2024.   Three Furoscix  doses 09/02/2024, 09/04/2024 and 09/04/2024 were not effective for resolving fluid accumulation.   Prescribed: Torsemide  20 mg take 5 tablets (100 mg total) by mouth two times a day.  Potassium 20 mEq packet take 80 mEq total by mouth twice day with Furoscix  and extra 40 mEq when using Metolazone .   Spironolactone  50 mg take 1 tablet (50 mg total) by mouth daily Furosemide  (Furoscix ) 80 mg/10 ml inject into skin daily    Labs: 08/28/2024 Creatinine 1.38, BUN 43, Potassium 3.9, Sodium 135, GFR 43, BNP 2,271.0 08/21/2024 Creatinine 2.25, BUN 68, Potassium 4.1, Sodium 133, GFR 24  08/20/2024 Creatinine 1.82, BUN 48, Potassium 4.0, Sodium 136, GFR 31  08/19/2024 Creatinine 1.62, BUN 32, Potassium 4.2, Sodium 137, GFR 35  08/18/2024 Creatinine 1.28, BUN 21, Potassium 3.4, Sodium 140, GFR 47 07/30/2024 Creatinine 1.34, BUN 29, Potassium 3.1, Sodium 140, GFR 45, BNP 1,826.2 07/20/2024 Creatinine 1.53, BUN 71, Potassium 3.2, Sodium 139, GFR  38  07/19/2024 Creatinine 1.57, BUN 62, Potassium 3.6, Sodium 135, GFR 37  07/18/2024 Creatinine 1.43, BUN 49, Potassium 4.3, Sodium 143, GFR 41  07/17/2024 Creatinine 1.27, BUN 36, Potassium 4.2, Sodium 138, GFR 48 07/16/2024 Creatinine 1.39, BUN 23, Potassium 4.4, Sodium 137, GFR 43  07/15/2024 Creatinine 0.79, BUN 14, Potassium 2.2, Sodium 146, GFR >60, BNP 1,303.1 A complete set of results can be found in Results Review.   Recommendations:  She is compliant with taking all her meds and completed the 3 doses of Furoscix  09/02/2024-09/04/2024.  She thinks her only option may be to go to the hospital since she continues unable to lie down at night due to SOB.  She is disappointed the Furoscix  did not resolve the the the fluid accumulation as it has in the past.     Follow-up plan: ICM clinic phone appointment on 09/11/2024 to recheck fluid levels.   91 day device clinic remote transmission 09/29/2024.     EP/Cardiology Office Visits:  09/22/2024 with HF Clinic.  Recall 07/26/2024 with [name], PA   Copy of ICM check sent to Dr. Cindie.    Sent to [name], NP for review and recommendations if needed.     Remote monitoring is medically necessary for Heart Failure Management.    90 day Daily Thoracic Impedance ICM trend: 06/09/2024 through 09/08/2024.    12-14 Month Thoracic Impedance ICM trend:     [name], RN 09/08/2024 3:33 PM

## 2024-09-09 ENCOUNTER — Encounter (HOSPITAL_COMMUNITY)

## 2024-09-09 NOTE — Progress Notes (Addendum)
 Spoke with patient and advised of [name], NP recommendations to   Take last dose of Furoscix  this evening with 40 KCL (hold torsemide  this evening). Tomorrow, resume torsemide  100 mg bid + 80 KCL bid.  Harlene requested the HF clinic RNs to see if we can get more Furoscix  sent out to her.   Advised to send remote transmission on 10/2 for review of fluid levels.    She agreed to plan and verbalized understanding.

## 2024-09-09 NOTE — Progress Notes (Signed)
 Per [name], NP She should hold torsemide /metolazone  while using Furoscix . Do Furoscix  + 40 KCL in AM then wait around 8-10 hrs then repeat.

## 2024-09-09 NOTE — Progress Notes (Addendum)
 Spoke with patient and heart failure questions reviewed.  Transmission results reviewed.  Pt breathing has improved after taking dose of Furoscix  at 5:00 PM on 09/08/2024 and again this morning 09/09/2024 at 9:00 AM.  She was able to sleep about 3-4 hours last night but still feels like she is retaining fluid because he feels SOB today, 09/09/2024.     She has held Torsemide  dosages since 5 pm yesterday, 09/08/2024.  She has 1 Furoscix  left due to 3 of the cartridges have turned red and shows error so she is unable to use those.    Pt would like to know:  if she should take the Torsemide  100 mg evening dose today (last Furoscix  dose today 09/09/2024 at 9:00 AM).    Can she obtain more Furoscix  and it be delivered to her.  She does not have the money today to pay someone to take her to the pharmacy to pick up.    Copy sent to [name], NP at [facility] clinic for review.    Updated Optivol today,09/09/2024.

## 2024-09-09 NOTE — Progress Notes (Signed)
  Received: Today Edroy, Harlene HERO, FNP  Jaquae Rieves, Mitzie RAMAN, RN; [facility] Clinical Pool  She can use her last dose of Furoscix  this evening with 40 KCL (hold torsemide  this evening).  Tomorrow, resume torsemide  100 mg bid + 80 KCL bid. Will forward this to our clinic RNs to see if we can get more Furoscix  sent out to her.

## 2024-09-10 ENCOUNTER — Other Ambulatory Visit: Payer: Self-pay

## 2024-09-10 ENCOUNTER — Telehealth (HOSPITAL_COMMUNITY): Payer: Self-pay

## 2024-09-10 ENCOUNTER — Other Ambulatory Visit (HOSPITAL_COMMUNITY): Payer: Self-pay

## 2024-09-10 NOTE — Telephone Encounter (Addendum)
 Called patient to advise her she still had 1 refill left on her Furoscix  and to call the pharmacy to get it delivered. Patient stated she tried but the sent out the wrong medication. I called [facility] and they will ship the Furoscix  out to the patient   ----- Message from Harlene CHRISTELLA Gainer sent at 09/09/2024  4:30 PM EDT ----- She can use her last dose of Furoscix  this evening with 40 KCL (hold torsemide  this evening).  Tomorrow, resume torsemide  100 mg bid + 80 KCL bid. Will forward this to our clinic RNs to see if we can get more Furoscix  sent out to her. ----- Message ----- From: Armanda Mitzie RAMAN, RN Sent: 09/09/2024   3:40 PM EDT To: Harlene CHRISTELLA Gainer, FNP  Updated today.  Harlene the Optivol is not improving, could it be inaccurate?

## 2024-09-10 NOTE — Progress Notes (Signed)
 Received call from nurse at [facility] requesting last furoscix  fill be sent to patient. Called patient and delivery  address.  Specialty Pharmacy Refill Coordination Note  Brittany [name]  is a 63 y.o. female contacted today regarding refills of specialty medication(s) Furosemide  (Furoscix )   Patient requested Delivery   Delivery date: 09/11/24   Verified address: Patient address [facility]  [address]  [place] [address]   Medication will be filled on 10.01.25.

## 2024-09-11 ENCOUNTER — Ambulatory Visit: Attending: Cardiology

## 2024-09-11 ENCOUNTER — Encounter (HOSPITAL_COMMUNITY)

## 2024-09-11 DIAGNOSIS — I5022 Chronic systolic (congestive) heart failure: Secondary | ICD-10-CM

## 2024-09-11 DIAGNOSIS — Z95810 Presence of automatic (implantable) cardiac defibrillator: Secondary | ICD-10-CM

## 2024-09-11 NOTE — Progress Notes (Signed)
 EPIC Encounter for ICM Monitoring  Patient Name: Brittany [name]  is a 63 y.o. female Date: 09/11/2024 Primary Care Physican: Tanda Bleacher, MD Primary Cardiologist: Bensimhon Electrophysiologist: Cindie 01/03/2024 Weight: 93-94 lbs  01/22/2024 Weight: 91 lbs (lowest weight 84 lbs) 04/18/2024 Office Weight:  99 lbs 05/27/2024 Weight: 87 lbs 07/14/2024 Weight: 89 lbs   Spoke with patient and heart failure questions reviewed.  Transmission results reviewed.     Expected to receive refill of Furoscix  on 10/2.   Pt reports following fluid symptoms:  Weight gain: None SOB:  Pt still SOB but has much improved.   Difficulty sleeping at night: Last night was 1st night in the past week she slept all night and was able to lie flat.   Swelling: None   Since 09/08/2024 ICM Remote Transmission: Optivol Thoracic impedance improving after taking Furoscix  (see dates below) but has not returned to baseline.   Prescribed: Torsemide  20 mg take 5 tablets (100 mg total) by mouth two times a day.  Potassium 20 mEq packet take 80 mEq total by mouth twice day with Furoscix  and extra 40 mEq when using Metolazone .   Spironolactone  50 mg take 1 tablet (50 mg total) by mouth daily Furosemide  (Furoscix ) 80 mg/10 ml inject into skin daily (1 Dose taken: 09/02/2024, 09/04/2024, 09/05/2024, 09/08/2024 and bid on 09/09/2024)    Labs: 08/28/2024 Creatinine 1.38, BUN 43, Potassium 3.9, Sodium 135, GFR 43, BNP 2,271.0 08/21/2024 Creatinine 2.25, BUN 68, Potassium 4.1, Sodium 133, GFR 24  08/20/2024 Creatinine 1.82, BUN 48, Potassium 4.0, Sodium 136, GFR 31  08/19/2024 Creatinine 1.62, BUN 32, Potassium 4.2, Sodium 137, GFR 35  08/18/2024 Creatinine 1.28, BUN 21, Potassium 3.4, Sodium 140, GFR 47 07/30/2024 Creatinine 1.34, BUN 29, Potassium 3.1, Sodium 140, GFR 45, BNP 1,826.2 07/20/2024 Creatinine 1.53, BUN 71, Potassium 3.2, Sodium 139, GFR 38  07/19/2024 Creatinine 1.57, BUN 62, Potassium 3.6, Sodium 135, GFR 37   07/18/2024 Creatinine 1.43, BUN 49, Potassium 4.3, Sodium 143, GFR 41  07/17/2024 Creatinine 1.27, BUN 36, Potassium 4.2, Sodium 138, GFR 48 07/16/2024 Creatinine 1.39, BUN 23, Potassium 4.4, Sodium 137, GFR 43  07/15/2024 Creatinine 0.79, BUN 14, Potassium 2.2, Sodium 146, GFR >60, BNP 1,303.1 A complete set of results can be found in Results Review.   Recommendations:  Sent to Caffie Shed, PA at HF clinic for review and recommendations if needed.     Follow-up plan: ICM clinic phone appointment on 09/30/2024 to recheck fluid levels (recheck in HF clinic OV 10/13)   91 day device clinic remote transmission 09/29/2024.     EP/Cardiology Office Visits:  09/22/2024 with HF Clinic.  Recall 07/26/2024 with [name], PA   Copy of ICM check sent to Dr. Cindie.        Remote monitoring is medically necessary for Heart Failure Management.    90 day Daily Thoracic Impedance ICM trend: 06/12/2024 through 09/11/2024.    12-14 Month Thoracic Impedance ICM trend:     [name], RN 09/11/2024 7:08 AM

## 2024-09-11 NOTE — Progress Notes (Signed)
 Spoke with patient and provided Brittany Shed PA recommended torsemide  100 mg bid and take 5 mg of metolazone  w/extra 40 mEq of KCl for the next 2 days.    She stated Metolazone  is not effective for fluid accumulation and Furoscix  works better.    She is not going to take the Metolazone   She will hold evening dose of Torsemide  and take Furoscix  dose this evening with extra 40 mEq of Potassium.  She will resume her regular dose of Torsemide  and Potassium tomorrow, 09/12/2024.    Message sent to [name], NP advising of patient's plan.

## 2024-09-11 NOTE — Progress Notes (Signed)
  Received: Today Marcine Caffie HERO, PA-C  Jeramyah Goodpasture, Mitzie RAMAN, RN Continue torsemide  100 mg bid and take 5 mg of metolazone  w/ extra 40 mEq of KCl for the next 2 days.

## 2024-09-15 ENCOUNTER — Telehealth (HOSPITAL_COMMUNITY): Payer: Self-pay | Admitting: Licensed Clinical Social Worker

## 2024-09-15 ENCOUNTER — Other Ambulatory Visit (HOSPITAL_COMMUNITY): Payer: Self-pay

## 2024-09-15 DIAGNOSIS — I5022 Chronic systolic (congestive) heart failure: Secondary | ICD-10-CM

## 2024-09-15 NOTE — Progress Notes (Signed)
 Remote ICD Transmission

## 2024-09-15 NOTE — Telephone Encounter (Signed)
 CSW informed pt had transportation concerns with getting to appt.  CSW called pt to discuss. She states she might have a ride- request CSW call her back later this week to check.  Andriette HILARIO Leech, LCSW Clinical Social Worker [facility] Desk#: [phone] Cell#: [phone]

## 2024-09-15 NOTE — Telephone Encounter (Signed)
 H&V Care Navigation CSW Progress Note  Clinical Social Worker arranged [employer] taxi to pick up at [employer] for appt  SDOH Screenings   Food Insecurity: No Food Insecurity (08/18/2024)  Housing: Low Risk  (08/18/2024)  Transportation Needs: No Transportation Needs (08/18/2024)  Utilities: Not At Risk (08/18/2024)  Alcohol Screen: Low Risk  (08/14/2024)  Depression (PHQ2-9): Low Risk  (07/22/2024)  Financial Resource Strain: Medium Risk (02/11/2021)  Physical Activity: Insufficiently Active (08/14/2024)  Social Connections: Moderately Isolated (08/18/2024)  Stress: Stress Concern Present (02/11/2021)  Tobacco Use: Medium Risk (09/04/2024)    09/15/2024  Brittany [name]  DOB: Jul 14, 1961 MRN: [id number]   RIDER WAIVER AND RELEASE OF LIABILITY  For the purposes of helping with transportation needs, [facility] partners with outside transportation providers (taxi companies, [place], Catering manager.) to give [place] patients or other approved people the choice of on-demand rides Public librarian) to our buildings for non-emergency visits.  By using [employer], I, the person signing this document, on behalf of myself and/or any legal minors (in my care using the [employer]), agree:  Science writer given to me are supplied by independent, outside transportation providers who do not work for, or have any affiliation with, [employer]. [place] is not a transportation company. [place] has no control over the quality or safety of the rides I get using [employer]. [place] has no control over whether any outside ride will happen on time or not. [place] gives no guarantee on the reliability, quality, safety, or availability on any rides, or that no mistakes will happen. I know and accept that traveling by vehicle (car, truck, SVU, fleeta, bus, taxi, etc.) has risks of serious injuries such as disability, being paralyzed, and death. I know and agree the risk of using  [employer] is mine alone, and not [employer]. [employer] are provided as is and as are available. The transportation providers are in charge for all inspections and care of the vehicles used to provide these rides. I agree not to take legal action against [place], its agents, employees, officers, directors, representatives, insurers, attorneys, assigns, successors, subsidiaries, and affiliates at any time for any reasons related directly or indirectly to using [employer]. I also agree not to take legal action against [facility] or its affiliates for any injury, death, or damage to property caused by or related to using [employer]. I have read this Waiver and Release of Liability, and I understand the terms used in it and their legal meaning. This Waiver is freely and voluntarily given with the understanding that my right (or any legal minors) to legal action against  relating to [employer] is knowingly given up to use these services.   I attest that I read the Ride Waiver and Release of Liability to [name] , gave Ms. [name]  the opportunity to ask questions and answered the questions asked (if any). I affirm that [name]  then provided consent for assistance with transportation.     Negan Grudzien H Yamilex Borgwardt

## 2024-09-16 ENCOUNTER — Other Ambulatory Visit (HOSPITAL_COMMUNITY): Payer: Self-pay

## 2024-09-16 ENCOUNTER — Other Ambulatory Visit: Payer: Self-pay

## 2024-09-16 ENCOUNTER — Encounter (HOSPITAL_COMMUNITY): Payer: Self-pay

## 2024-09-16 ENCOUNTER — Telehealth (HOSPITAL_COMMUNITY): Payer: Self-pay | Admitting: Pharmacist

## 2024-09-16 ENCOUNTER — Ambulatory Visit (HOSPITAL_COMMUNITY)
Admission: RE | Admit: 2024-09-16 | Discharge: 2024-09-16 | Disposition: A | Discharge status: Home / Self Care | Source: Ambulatory Visit | Attending: Physician Assistant | Admitting: Physician Assistant

## 2024-09-16 ENCOUNTER — Ambulatory Visit (HOSPITAL_COMMUNITY): Payer: Self-pay | Admitting: Physician Assistant

## 2024-09-16 VITALS — BP 100/62 | HR 98 | Ht 59.5 in | Wt 87.8 lb

## 2024-09-16 DIAGNOSIS — I130 Hypertensive heart and chronic kidney disease with heart failure and stage 1 through stage 4 chronic kidney disease, or unspecified chronic kidney disease: Secondary | ICD-10-CM | POA: Insufficient documentation

## 2024-09-16 DIAGNOSIS — E1122 Type 2 diabetes mellitus with diabetic chronic kidney disease: Secondary | ICD-10-CM | POA: Diagnosis not present

## 2024-09-16 DIAGNOSIS — Z7984 Long term (current) use of oral hypoglycemic drugs: Secondary | ICD-10-CM | POA: Diagnosis not present

## 2024-09-16 DIAGNOSIS — Z923 Personal history of irradiation: Secondary | ICD-10-CM | POA: Insufficient documentation

## 2024-09-16 DIAGNOSIS — Z7901 Long term (current) use of anticoagulants: Secondary | ICD-10-CM | POA: Insufficient documentation

## 2024-09-16 DIAGNOSIS — I25810 Atherosclerosis of coronary artery bypass graft(s) without angina pectoris: Secondary | ICD-10-CM

## 2024-09-16 DIAGNOSIS — Z955 Presence of coronary angioplasty implant and graft: Secondary | ICD-10-CM | POA: Diagnosis not present

## 2024-09-16 DIAGNOSIS — I2510 Atherosclerotic heart disease of native coronary artery without angina pectoris: Secondary | ICD-10-CM | POA: Insufficient documentation

## 2024-09-16 DIAGNOSIS — I5084 End stage heart failure: Secondary | ICD-10-CM | POA: Insufficient documentation

## 2024-09-16 DIAGNOSIS — Z79899 Other long term (current) drug therapy: Secondary | ICD-10-CM | POA: Diagnosis not present

## 2024-09-16 DIAGNOSIS — I480 Paroxysmal atrial fibrillation: Secondary | ICD-10-CM | POA: Diagnosis not present

## 2024-09-16 DIAGNOSIS — R627 Adult failure to thrive: Secondary | ICD-10-CM | POA: Diagnosis not present

## 2024-09-16 DIAGNOSIS — I5022 Chronic systolic (congestive) heart failure: Secondary | ICD-10-CM | POA: Diagnosis not present

## 2024-09-16 DIAGNOSIS — J9611 Chronic respiratory failure with hypoxia: Secondary | ICD-10-CM | POA: Diagnosis not present

## 2024-09-16 DIAGNOSIS — Z86718 Personal history of other venous thrombosis and embolism: Secondary | ICD-10-CM | POA: Diagnosis not present

## 2024-09-16 DIAGNOSIS — Z95810 Presence of automatic (implantable) cardiac defibrillator: Secondary | ICD-10-CM | POA: Insufficient documentation

## 2024-09-16 DIAGNOSIS — I252 Old myocardial infarction: Secondary | ICD-10-CM | POA: Diagnosis not present

## 2024-09-16 DIAGNOSIS — Z66 Do not resuscitate: Secondary | ICD-10-CM | POA: Diagnosis not present

## 2024-09-16 DIAGNOSIS — F1290 Cannabis use, unspecified, uncomplicated: Secondary | ICD-10-CM | POA: Diagnosis not present

## 2024-09-16 DIAGNOSIS — Z95811 Presence of heart assist device: Secondary | ICD-10-CM | POA: Insufficient documentation

## 2024-09-16 DIAGNOSIS — Z9221 Personal history of antineoplastic chemotherapy: Secondary | ICD-10-CM | POA: Diagnosis not present

## 2024-09-16 DIAGNOSIS — N1832 Chronic kidney disease, stage 3b: Secondary | ICD-10-CM | POA: Diagnosis not present

## 2024-09-16 DIAGNOSIS — Z87891 Personal history of nicotine dependence: Secondary | ICD-10-CM | POA: Diagnosis not present

## 2024-09-16 DIAGNOSIS — J4489 Other specified chronic obstructive pulmonary disease: Secondary | ICD-10-CM | POA: Insufficient documentation

## 2024-09-16 DIAGNOSIS — R54 Age-related physical debility: Secondary | ICD-10-CM | POA: Insufficient documentation

## 2024-09-16 DIAGNOSIS — Z85118 Personal history of other malignant neoplasm of bronchus and lung: Secondary | ICD-10-CM | POA: Diagnosis not present

## 2024-09-16 DIAGNOSIS — Z681 Body mass index (BMI) 19 or less, adult: Secondary | ICD-10-CM | POA: Insufficient documentation

## 2024-09-16 DIAGNOSIS — Z951 Presence of aortocoronary bypass graft: Secondary | ICD-10-CM | POA: Diagnosis not present

## 2024-09-16 LAB — BASIC METABOLIC PANEL WITH GFR
Anion gap: 15 (ref 5–15)
BUN: 23 mg/dL (ref 8–23)
CO2: 26 mmol/L (ref 22–32)
Calcium: 9.7 mg/dL (ref 8.9–10.3)
Chloride: 98 mmol/L (ref 98–111)
Creatinine, Ser: 1.47 mg/dL — ABNORMAL HIGH (ref 0.44–1.00)
GFR, Estimated: 40 mL/min — ABNORMAL LOW (ref >60)
Glucose, Bld: 117 mg/dL — ABNORMAL HIGH (ref 70–99)
Potassium: 3.9 mmol/L (ref 3.5–5.1)
Sodium: 139 mmol/L (ref 135–145)

## 2024-09-16 LAB — BRAIN NATRIURETIC PEPTIDE: B Natriuretic Peptide: 1596.2 pg/mL — ABNORMAL HIGH (ref 0.0–100.0)

## 2024-09-16 MED ORDER — FUROSCIX 80 MG/10ML ~~LOC~~ CTKT
80.0000 mg | CARTRIDGE | Freq: Every day | SUBCUTANEOUS | 0 refills | Status: DC | PRN
  Filled 2024-09-16: qty 10, 10d supply, fill #0

## 2024-09-16 NOTE — Progress Notes (Addendum)
 [facility]   PCP:  Tanda Bleacher, MD  Oncology: Dr. Sherrod  HF Cardiologist: Dr Cherrie  HPI: Brittany [name]  is a 63 y.o. 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 Underwent emergent cath 02/03/18 showed LAD 100% stenosed, LCx 95% stenosed. Taken for emergent CABG 02/03/18. Required impella post op. Hospital course complicated by cardiogenic shock, HCAP, A fib, respiratory failure, and swallowing issues. She was discharged to SNF. Discharge weight 103 pounds.    In 2019 had multiple hospitalizations for HF and pleural effusion. Underwent pleurodesis at [facility].   Admitted 3/20 with NSTEMI and HF. Received DES to ostial ramus into distal left main based on cath below and underwent diuresis. Meds adjusted as tolerated. Echo with EF 25-30%.    Admitted twice in 2023 with low output requiring milrinone . Barostim placed.   Multiple CHF and AECOPD admission in May, August, October of 2024 and Janurary 2025. Echo 1/25 EF  < 20%, G2DD, RV moderately to severely reduced. She remained DNR.  Admitted 3/25 for CHF and AECOPD. Readmitted 4/25 for CHF/COPD. Was hypotensive. Moved to ICU. Required DBA and IV diuresis. Refused [facility] services.   Admitted 8/25 with a/c hypoxic respiratory failure and acute on chronic CHF. She required inotrope support with DBA which was wean off prior to discharge.   Remote ICD interrogation 08/18 indicated fluid accumulation since 08/12. We attempted to arrange for Furoscix  08/19 but she was unable to get to clinic to pick up the kit. She did use 5 mg metolazone .  Admitted 9/25 with a/c HF. Diuresed with IV lasix  and transiently required BiPap. GDMT titrated  but limited by AKI. She was discharged home, weight 86 lbs.  Here today for close HF follow-up. Weight at home stable at 87 lb. Notes occasional abdominal bloating and discomfort when attempting to eat. Has trouble gaining weight. Chest feels a little tight this am which tends to occur with fluid retention. Otherwise, feeling okay. No orthopnea, PND, abdominal bloating or lower extremity edema. Reports taking meds as prescribed. Last dose of Furoscix  was on 10/2.    Past Medical History:  Diagnosis Date   Acute respiratory failure (HCC) 05/05/2018   Acute systolic congestive heart failure (HCC) 02/03/2018   AICD (automatic cardioverter/defibrillator) present    Allergy    Anxiety    Asthma    DM2 (diabetes mellitus, type 2) (HCC) 10/20/2020   Hypertension    PAF (paroxysmal atrial fibrillation) (HCC)    Presence of permanent cardiac pacemaker    Prophylactic measure 08/03/14-08/19/14   Prophyl. 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   LIMA to LAD, SVG to D1, SVG to OM1, EVH via right thigh with implantation of Impella LD LVAD via direct aortic approach   Small cell lung cancer (HCC) 03/16/2014   Past Surgical History:  Procedure Laterality Date   BRONCHIAL BRUSHINGS  10/25/2020   Procedure: BRONCHIAL BRUSHINGS;  Surgeon: Shelah Lamar RAMAN, MD;  Location: [facility] ENDOSCOPY;  Service: Pulmonary;;   BRONCHIAL NEEDLE ASPIRATION BIOPSY  10/25/2020   Procedure: BRONCHIAL NEEDLE ASPIRATION BIOPSIES;  Surgeon: Shelah Lamar RAMAN, MD;  Location: [facility] ENDOSCOPY;  Service: Pulmonary;;   CARDIAC DEFIBRILLATOR PLACEMENT  08/15/2018  MDT Visia AF MRI VR ICD implanted by Dr Zulma for primary prevention of sudden   CESAREAN SECTION     CORONARY ARTERY BYPASS GRAFT N/A 02/03/2018   Procedure: CORONARY ARTERY BYPASS GRAFTING (CABG);  Surgeon: Dusty Sudie DEL, MD;  Location: [facility] OR;  Service: Open Heart Surgery;  Laterality: N/A;  Time 3 using left internal mammary artery and endoscopically  harvested right saphenous vein   CORONARY BALLOON ANGIOPLASTY N/A 02/03/2018   Procedure: CORONARY BALLOON ANGIOPLASTY;  Surgeon: Swaziland, Peter M, MD;  Location: [facility] INVASIVE CV LAB;  Service: Cardiovascular;  Laterality: N/A;   CORONARY STENT INTERVENTION N/A 02/12/2019   Procedure: CORONARY STENT INTERVENTION;  Surgeon: Darron Deatrice LABOR, MD;  Location: [facility] INVASIVE CV LAB;  Service: Cardiovascular;  Laterality: N/A;   CORONARY/GRAFT ACUTE MI REVASCULARIZATION N/A 02/03/2018   Procedure: Coronary/Graft Acute MI Revascularization;  Surgeon: Swaziland, Peter M, MD;  Location: [facility] INVASIVE CV LAB;  Service: Cardiovascular;  Laterality: N/A;   ENDOBRONCHIAL ULTRASOUND N/A 10/25/2020   Procedure: ENDOBRONCHIAL ULTRASOUND;  Surgeon: Shelah Lamar RAMAN, MD;  Location: [facility] ENDOSCOPY;  Service: Pulmonary;  Laterality: N/A;   FLEXIBLE BRONCHOSCOPY  10/25/2020   Procedure: FLEXIBLE BRONCHOSCOPY;  Surgeon: Shelah Lamar RAMAN, MD;  Location: [facility] ENDOSCOPY;  Service: Pulmonary;;   IABP INSERTION N/A 02/03/2018   Procedure: IABP Insertion;  Surgeon: Swaziland, Peter M, MD;  Location: [facility] INVASIVE CV LAB;  Service: Cardiovascular;  Laterality: N/A;   INTRAOPERATIVE TRANSESOPHAGEAL ECHOCARDIOGRAM N/A 02/03/2018   Procedure: INTRAOPERATIVE TRANSESOPHAGEAL ECHOCARDIOGRAM;  Surgeon: Dusty Sudie DEL, MD;  Location: [facility] OR;  Service: Open Heart Surgery;  Laterality: N/A;   LEFT HEART CATH AND CORONARY ANGIOGRAPHY N/A 02/03/2018   Procedure: LEFT HEART CATH AND CORONARY ANGIOGRAPHY;  Surgeon: Swaziland, Peter M, MD;  Location: [facility] INVASIVE CV LAB;  Service: Cardiovascular;  Laterality: N/A;   LEFT HEART CATH AND CORS/GRAFTS ANGIOGRAPHY N/A 02/12/2019   Procedure: LEFT HEART CATH AND CORS/GRAFTS ANGIOGRAPHY;  Surgeon: Darron Deatrice LABOR, MD;  Location: [facility] INVASIVE CV LAB;  Service: Cardiovascular;  Laterality: N/A;   MEDIASTINOSCOPY N/A 03/11/2014   Procedure: MEDIASTINOSCOPY;  Surgeon: Elspeth JAYSON Millers, MD;  Location: [facility] OR;  Service: Thoracic;   Laterality: N/A;   PLACEMENT OF IMPELLA LEFT VENTRICULAR ASSIST DEVICE  02/03/2018   Procedure: PLACEMENT OF IMPELLA LEFT VENTRICULAR ASSIST DEVICE LD;  Surgeon: Dusty Sudie DEL, MD;  Location: [facility] OR;  Service: Open Heart Surgery;;   REMOVAL OF IMPELLA LEFT VENTRICULAR ASSIST DEVICE N/A 02/08/2018   Procedure: REMOVAL OF IMPELLA LEFT VENTRICULAR ASSIST DEVICE;  Surgeon: Dusty Sudie DEL, MD;  Location: [facility] OR;  Service: Open Heart Surgery;  Laterality: N/A;   RIGHT HEART CATH N/A 02/03/2018   Procedure: RIGHT HEART CATH;  Surgeon: Swaziland, Peter M, MD;  Location: [facility] INVASIVE CV LAB;  Service: Cardiovascular;  Laterality: N/A;   RIGHT HEART CATH N/A 05/09/2018   Procedure: RIGHT HEART CATH;  Surgeon: Cherrie Toribio SAUNDERS, MD;  Location: [facility] INVASIVE CV LAB;  Service: Cardiovascular;  Laterality: N/A;   TEE WITHOUT CARDIOVERSION N/A 02/08/2018   Procedure: TRANSESOPHAGEAL ECHOCARDIOGRAM (TEE);  Surgeon: Dusty Sudie DEL, MD;  Location: [facility] OR;  Service: Open Heart Surgery;  Laterality: N/A;   TUBAL LIGATION     VIDEO BRONCHOSCOPY WITH ENDOBRONCHIAL ULTRASOUND N/A 03/11/2014   Procedure: VIDEO BRONCHOSCOPY WITH ENDOBRONCHIAL ULTRASOUND;  Surgeon: Elspeth JAYSON Millers, MD;  Location: [facility] OR;  Service: Thoracic;  Laterality: N/A;   Current Outpatient Medications  Medication Sig Dispense Refill   albuterol  (VENTOLIN  HFA) 108 (  90 Base) MCG/ACT inhaler Inhale 2 puffs into the lungs every 6 (six) hours as needed for wheezing or shortness of breath. 8.5 g 1   apixaban  (ELIQUIS ) 2.5 MG TABS tablet Take 1 tablet (2.5 mg total) by mouth 2 (two) times daily. 180 tablet 1   atorvastatin  (LIPITOR ) 80 MG tablet Take 1 tablet (80 mg total) by mouth daily. 90 tablet 0   cyclobenzaprine  (FLEXERIL ) 10 MG tablet Take 1 tablet (10 mg total) by mouth 3 (three) times daily as needed for muscle spasms. 60 tablet 3   dapagliflozin  propanediol (FARXIGA ) 10 MG TABS tablet Take 1 tablet (10 mg total) by mouth daily. 30 tablet 5   Digoxin   62.5 MCG TABS Take 0.0625 mg by mouth daily.     Dihydroxyaluminum Sod Carb (ROLAIDS PO) Take 1 tablet by mouth as needed.     hydrOXYzine  (VISTARIL ) 25 MG capsule TAKE 1 CAPSULE (25 MG TOTAL) BY MOUTH DAILY AS NEEDED. 90 capsule 1   ipratropium-albuterol  (DUONEB) 0.5-2.5 (3) MG/3ML SOLN TAKE 3 MLS BY NEBULIZATION EVERY 4 (FOUR) HOURS AS NEEDED (FOR SHORTNESS OF BREATH). 180 mL 3   ivabradine  (CORLANOR ) 7.5 MG TABS tablet TAKE 1 TABLET BY MOUTH TWICE A DAY WITH FOOD 120 tablet 1   megestrol  (MEGACE ) 20 MG tablet Take 1 tablet (20 mg total) by mouth 2 (two) times daily. 60 tablet 1   metolazone  (ZAROXOLYN ) 5 MG tablet Take 1 tablet (5 mg total) by mouth as needed (as instructed by Advanced HF clinic). 10 tablet 3   Multiple Vitamin (MULTIVITAMIN WITH MINERALS) TABS tablet Take 1 tablet by mouth daily. Gummies     nitroGLYCERIN  (NITROSTAT ) 0.4 MG SL tablet Place 1 tablet (0.4 mg total) under the tongue every 5 (five) minutes as needed for chest pain. 90 tablet 0   Nutritional Supplements (ENSURE ORIGINAL) LIQD Take 1 Bottle by mouth 2 (two) times daily as needed (poor appetite).     pantoprazole  (PROTONIX ) 40 MG tablet Take 1 tablet (40 mg total) by mouth daily at 6 (six) AM. 5 tablet 0   potassium chloride  (KLOR-CON ) 20 MEQ packet Take 80 mEq by mouth 2 (two) times daily. TAKE 80 MEQ TOTAL TWICE A DAY EXCEPT ON METOLAZONE  DAYS OF TUESDAYS TAKE (100 MEQ) 3 TIMES PER DAY. 360 packet 2   spironolactone  (ALDACTONE ) 25 MG tablet Take 1 tablet (25 mg total) by mouth daily.     torsemide  (DEMADEX ) 20 MG tablet Take 5 tablets (100 mg total) by mouth 2 (two) times daily.     Furosemide  (FUROSCIX ) 80 MG/10ML CTKT Inject 80 mg into the skin daily as needed (fluid and edema). Take as directed by the heart failure clinic only 10 each 0   No current facility-administered medications for this encounter.    Allergies:   Lactose intolerance (gi) and Codeine   Social History:  The patient  reports that she quit  smoking about 10 years ago. Her smoking use included cigarettes. She started smoking about 30 years ago. She has a 20 pack-year smoking history. She has never used smokeless tobacco. She reports current alcohol use of about 5.0 standard drinks of alcohol per week. She reports current drug use. Drug: Marijuana.   Family History:  The patient's family history includes Cancer in her maternal grandmother; Diabetes in her paternal grandmother; Heart attack in her mother; Heart disease in her mother; Hypertension in her maternal grandmother and mother.   ROS:  Please see the history of present illness.   All other  systems are personally reviewed and negative.    Wt Readings from Last 3 Encounters:  09/16/24 39.8 kg (87 lb 12.8 oz)  09/02/24 41.2 kg (90 lb 12.8 oz)  08/28/24 39.3 kg (86 lb 9.6 oz)   BP 100/62   Pulse 98   Ht 4' 11.5 (1.511 m)   Wt 39.8 kg (87 lb 12.8 oz)   SpO2 96%   BMI 17.44 kg/m   Physical Exam General:  Thin, chronically ill appearing Neck: JVP 10-12 Cor: Regular rate & rhythm. No murmurs. Lungs: diminished Abdomen: soft, nontender, nondistended.  Extremities: no edema Neuro: alert & orientedx3. Affect pleasant   Device interrogation (personally reviewed): No AF or VT/VF, activity 0.8 hr/day, Optivol trending up since mid September, thoracic impedance below threshold   Assessment & Plan: 1. End-stage Chronic Systolic Heart Failure - EF has been 20-25% for the last 5 years.  - S/p Medtronic ICD and Barostim - Echo 1/25: EF < 20%, mod to severely reduced RV (in setting of RSV) - Numerous hospitalizations over past 18 months  - NYHA III, functional status confounded by COPD. Volume up on exam and by device (OptiVol significantly elevated). - Use furoscix  X 1 today and again on 10/10. Should only take 100 mg Torsemide  once these days. Other days should take 100 mg Torsemide  BID. May need furoscix  twice a week. Will ask remote monitoring RN, Mitzie Garner, to check  device in 1 week. - Has metolazone  to use PRN but she does not feel she responds very well to this - Continue digoxin  0.0625 mg daily - Continue Farxiga  10 mg daily. - Continue spironolactone  25 mg daily. - Continue ivabradine  7.5 mg bid. - Not a candidate for advanced therapies due to severity of lung disease and frailty.  - Labs today   2. Chronic Hypoxic Respiratory Failure/COPD  - Hx frequent admissions for AE COPD + A/C HFrEF.  - Currently stable   3. CKD IIIb - Creatinine baseline ~ 1.4-1.7.  - Continue Farxiga  10 mg daily - Labs today  4. PAF  - Regular on exam. No episodes on device -Continue Eliquis  2.5 mg BID, reduced dose (weight/creatinine). No bleeding issues  5. CAD - History of CABG x 2 2019 - s/p DES ostial ramus extending to left main (2020) - No angina - No ASA with need for anticoagulation - Continue high-intensity statin  6. H/o SCLC - Completed treatment 2015.  - CT chest (5/24) soft tissue thickening along mediastinum but no discrete mass    7. H/o Renal Infarct 2022 - On Eliquis .  - No bleeding issues.   8. GOC/FTT - She is DNR/DNI   9. Deconditioning - Needing some assistance with ADLs. - She has declined HH or PT/OT.  10. DM II -Last A1c was 6.5% -Continue Jardiance . Not on any other meds  Follow up 2 weeks with APP, 1 week device interrogation with Mitzie Garner, RN.  [name], PA-C 09/16/24

## 2024-09-16 NOTE — Progress Notes (Signed)
 Specialty Pharmacy Initial Fill Coordination Note  Brittany [name]  is a 63 y.o. female contacted today regarding initial fill of specialty medication(s) Furosemide  (Furoscix )   Patient requested Delivery   Delivery date: 09/17/24   Verified address: [address] [place] [address]   Medication will be filled on 09/16/2024.   Patient is aware of $0 copayment.   Disenrolling as this is a one time fill.

## 2024-09-16 NOTE — Telephone Encounter (Signed)
 Patient seen today at [facility] AHF clinic. APP requested Furoscix  be refilled, sent to [facility].

## 2024-09-16 NOTE — Patient Instructions (Signed)
 Medication Changes:  TODAY AND FRIDAY  TAKE Furoscix  in the morning. DO NOT TAKE TORSEMIDE  IN THE MORNING OF THESE DAYS. Do take Torsemide  in the afternoon both days.  Lab Work:  Labs done today, your results will be available in MyChart, we will contact you for abnormal readings.   Follow-Up in: Please follow up with the Advanced [facility] Failure Clinic in 2 weeks with the Advanced Practice Provider.   At the [facility], you and your health needs are our priority. We have a designated team specialized in the treatment of Heart Failure. This Care Team includes your primary Heart Failure Specialized Cardiologist (physician), Advanced Practice Providers (APPs- Physician Assistants and Nurse Practitioners), and Pharmacist who all work together to provide you with the care you need, when you need it.   You may see any of the following providers on your designated Care Team at your next follow up:  Dr. Toribio Fuel Dr. Ezra Shuck Dr. Ria Commander Dr. Odis Brownie Greig Mosses, NP Caffie Shed, [place] [facility], [place] Beckey Coe, NP [place] Lee, NP Tinnie Redman, PharmD   Please be sure to bring in all your medications bottles to every appointment.   Need to Contact Us :  If you have any questions or concerns before your next appointment please send us  a message through [place] or call our office at [phone].    TO LEAVE A MESSAGE FOR THE NURSE SELECT OPTION 2, PLEASE LEAVE A MESSAGE INCLUDING: YOUR NAME DATE OF BIRTH CALL BACK NUMBER REASON FOR CALL**this is important as we prioritize the call backs  YOU WILL RECEIVE A CALL BACK THE SAME DAY AS LONG AS YOU CALL BEFORE 4:00 PM

## 2024-09-19 ENCOUNTER — Other Ambulatory Visit (HOSPITAL_COMMUNITY): Payer: Self-pay

## 2024-09-22 ENCOUNTER — Telehealth: Payer: Self-pay

## 2024-09-22 ENCOUNTER — Ambulatory Visit: Attending: Cardiology

## 2024-09-22 ENCOUNTER — Other Ambulatory Visit (HOSPITAL_COMMUNITY): Payer: Self-pay

## 2024-09-22 ENCOUNTER — Encounter (HOSPITAL_COMMUNITY)

## 2024-09-22 DIAGNOSIS — I5022 Chronic systolic (congestive) heart failure: Secondary | ICD-10-CM

## 2024-09-22 DIAGNOSIS — Z95810 Presence of automatic (implantable) cardiac defibrillator: Secondary | ICD-10-CM

## 2024-09-22 NOTE — Addendum Note (Signed)
 Encounter addended by: Colletta Manuelita Garre, PA-C on: 09/22/2024 5:36 PM  Actions taken: Clinical Note Signed

## 2024-09-22 NOTE — Progress Notes (Signed)
  Received: Today [name], Harlene HERO, FNP  Graycee Greeson, Mitzie RAMAN, RN; [facility] Clinical Pool Volume remains elevated.  Use Furoscix  bid + 80 KCL bid tomorrow and Wednesday. Hold torsemide  while using Furoscix . On Thursday, resume torsemide  100 mg bid with usual daily potassium doses.

## 2024-09-22 NOTE — Telephone Encounter (Signed)
 Spoke with patient and requested a manual remote transmission as requested by [facility] 09/16/2024 to recheck fluid levels today, 09/22/2024.

## 2024-09-22 NOTE — Progress Notes (Signed)
 Message Received: Today Brittany [name] HERO, CMA  Bradey Luzier, Mitzie RAMAN, RN Per Harlene agreeable to fursoscix dose schedule starting afternoon of 10/13   Patient aware and voiced understanding

## 2024-09-22 NOTE — Progress Notes (Signed)
 Spoke with patient and advised of Brittany [name]'s recommendation to take Furoscix  bid + 80 KCL bid starting omorrow and Wednesday. Hold torsemide  while using Furoscix . On Thursday, resume torsemide  100 mg bid with usual daily potassium doses.  Patient feels like she needs to start Furoscix  today to decrease risk of ED visit.   Her Plan: Monday: She will start bid dose today.  She will Take Furoscix  NOW with 80 KCL.  Will wait 8-10 hours and take a 2nd dose tonight.  She will hold evening Torsemide  today.    Tuesday: Furoscix  with 80 KCL twice waiting 8-10 hrs between dosages and hold Torsemide  dosages.  Wednesday: She will resume Torsemide  100 mg bid with prescribed daily potassium on Wednesday.    Sent to [name], NP for plan approval.

## 2024-09-22 NOTE — Progress Notes (Signed)
 EPIC Encounter for ICM Monitoring  Patient Name: Brittany [name]  is a 63 y.o. female Date: 09/22/2024 Primary Care Physican: Tanda Bleacher, MD Primary Cardiologist: Bensimhon Electrophysiologist: Cindie 01/03/2024 Weight: 93-94 lbs  01/22/2024 Weight: 91 lbs (lowest weight 84 lbs) 04/18/2024 Office Weight:  99 lbs 05/27/2024 Weight: 87 lbs 07/14/2024 Weight: 89 lbs 09/22/2024 Weight: 89.10 lbs   Spoke with patient and heart failure questions reviewed.  Transmission results reviewed.       Pt reports following fluid symptoms:  Weight gain: None SOB:  Pt reports SOB over the last 2 days.  Difficulty sleeping at night: Unable to sleep or lie flat.  Sat straight up in chair last night.     Swelling: None Other:  Feels fluid in her throat and chest.  Also has band of tightness under her breast which is normal her sx of fluid.    Since 09/08/2024 ICM Remote Transmission: Optivol Thoracic impedance suggesting possible fluid accumulation starting 08/27/2024 but with slight improvement after 09/16/2024.   Prescribed: Torsemide  20 mg take 5 tablets (100 mg total) by mouth two times a day.  Potassium 20 mEq packet take 80 mEq total by mouth twice day with Furoscix  and extra 40 mEq when using Metolazone .   Spironolactone  50 mg take 1 tablet (50 mg total) by mouth daily Furosemide  (Furoscix ) 80 mg/10 ml inject into skin daily (Last Dose taken: 09/19/2024)    Labs: 09/16/2024 Creatinine 1.47, BUN 23, Potassium 3.9, Sodium 139, GFR 40, BNP 1,596.2 08/28/2024 Creatinine 1.38, BUN 43, Potassium 3.9, Sodium 135, GFR 43, BNP 2,271.0 08/21/2024 Creatinine 2.25, BUN 68, Potassium 4.1, Sodium 133, GFR 24  08/20/2024 Creatinine 1.82, BUN 48, Potassium 4.0, Sodium 136, GFR 31  08/19/2024 Creatinine 1.62, BUN 32, Potassium 4.2, Sodium 137, GFR 35  08/18/2024 Creatinine 1.28, BUN 21, Potassium 3.4, Sodium 140, GFR 47 07/30/2024 Creatinine 1.34, BUN 29, Potassium 3.1, Sodium 140, GFR 45, BNP 1,826.2 A  complete set of results can be found in Results Review.   Recommendations:  Sent to [name], NP, at [facility] clinic for review and recommendations if needed.    Last Torsemide  dosage was at 4 AM this morning.  Last Furoscix  dose 09/19/2024 as instructed at 09/16/2024 OV with [name] PA.    Follow-up plan: ICM clinic phone appointment on 10/06/2024 to recheck fluid levels (will be check at HF Clinic 09/30/2024).   91 day device clinic remote transmission 09/29/2024.     EP/Cardiology Office Visits:  09/30/2024 with HF Clinic.  Recall 07/26/2024 with [name], PA   Copy of ICM check sent to Dr. Cindie.         Remote monitoring is medically necessary for Heart Failure Management.    90 day Daily Thoracic Impedance ICM trend: 06/23/2024 through 09/22/2024.    12-14 Month Thoracic Impedance ICM trend:     [name], RN 09/22/2024 1:18 PM

## 2024-09-22 NOTE — Progress Notes (Signed)
 Spoke with patient and she received call from HF clinic regarding recommendations and verbalized understanding.   Advised to go to ER or call 911 if symptoms worsen.

## 2024-09-23 ENCOUNTER — Other Ambulatory Visit: Payer: Self-pay

## 2024-09-23 ENCOUNTER — Other Ambulatory Visit (HOSPITAL_COMMUNITY): Payer: Self-pay

## 2024-09-23 ENCOUNTER — Telehealth (HOSPITAL_COMMUNITY): Payer: Self-pay | Admitting: Pharmacist

## 2024-09-23 ENCOUNTER — Telehealth: Payer: Self-pay

## 2024-09-23 MED ORDER — FUROSCIX 80 MG/10ML ~~LOC~~ CTKT
80.0000 mg | CARTRIDGE | Freq: Every day | SUBCUTANEOUS | 0 refills | Status: DC | PRN
  Filled 2024-09-23: qty 10, 10d supply, fill #0

## 2024-09-23 NOTE — Telephone Encounter (Signed)
 Spoke with patient and explained [employer] does not do same day delivery but is scheduled to deliver tomorrow to her home.  She appreciated the call.  She reports she is feeling better today but knows she will need another dose Furoscix  tomorrow when she receives it.

## 2024-09-23 NOTE — Telephone Encounter (Signed)
 Received voice mail message from patient asking to check on delivery of Furoscix  for today.   Spoke with Brittany [name] Staff and asked if Furoscix  could be delivered today.  Representative stated they only deliver overnight and do not have same day delivery.  Appreciated the information.

## 2024-09-23 NOTE — Progress Notes (Signed)
 Specialty Pharmacy Initial Fill Coordination Note  Brittany [name]  is a 63 y.o. female contacted today regarding initial fill of specialty medication(s) Furosemide  (Furoscix )   Patient requested Delivery   Delivery date: 09/23/24   Verified address[facility]   [address]   [place] [address]   Medication will be filled on 09/23/2024.   Patient is aware of $0 copayment.   Disenrolling as this is a one time fill.

## 2024-09-23 NOTE — Progress Notes (Signed)
 Patient called pharmacy regarding possible malfunction of Furoscix . Clinic Pharmacist Jaun Bash is aware of this and will be reaching out to patient to discuss administration vs coming into the clinic for a demo.

## 2024-09-23 NOTE — Telephone Encounter (Signed)
 Patient reports three Furoscix  device malfunctions. She states 2-4 of her 10 Furoscix  boxes were 'defective.' This is most likely an issue with patient administration technique, however, she is adamant that her administration technique is correct and refuses further education on the matter. She states that she she needs another dose tonight, but is unable to present to clinic for samples. New prescription sent to [facility] pharmacy. Will attempt to expedite delivery for today, however, courier limitations may require it delivery for tomorrow.

## 2024-09-29 ENCOUNTER — Ambulatory Visit: Payer: BC Managed Care – PPO

## 2024-09-29 DIAGNOSIS — I5022 Chronic systolic (congestive) heart failure: Secondary | ICD-10-CM

## 2024-09-29 NOTE — Progress Notes (Incomplete)
 [facility]   PCP:  Tanda Bleacher, MD  Oncology: Dr. Sherrod  HF Cardiologist: Dr Cherrie  HPI: Brittany [name]  is a 63 y.o. 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 Underwent emergent cath 02/03/18 showed LAD 100% stenosed, LCx 95% stenosed. Taken for emergent CABG 02/03/18. Required impella post op. Hospital course complicated by cardiogenic shock, HCAP, A fib, respiratory failure, and swallowing issues. She was discharged to SNF. Discharge weight 103 pounds.    In 2019 had multiple hospitalizations for HF and pleural effusion. Underwent pleurodesis at [facility].   Admitted 3/20 with NSTEMI and HF. Received DES to ostial ramus into distal left main based on cath below and underwent diuresis. Meds adjusted as tolerated. Echo with EF 25-30%.    Admitted twice in 2023 with low output requiring milrinone . Barostim placed.   Multiple CHF and AECOPD admission in May, August, October of 2024 and Janurary 2025. Echo 1/25 EF  < 20%, G2DD, RV moderately to severely reduced. She remained DNR.  Admitted 3/25 for CHF and AECOPD. Readmitted 4/25 for CHF/COPD. Was hypotensive. Moved to ICU. Required DBA and IV diuresis. Refused [facility] services.   Admitted 8/25 with a/c hypoxic respiratory failure and acute on chronic CHF. She required inotrope support with DBA which was wean off prior to discharge.   Remote ICD interrogation 08/18 indicated fluid accumulation since 08/12. We attempted to arrange for Furoscix  08/19 but she was unable to get to clinic to pick up the kit. She did use 5 mg metolazone .  Admitted 9/25 with a/c HF. Diuresed with IV lasix  and transiently required BiPap. GDMT titrated  but limited by AKI. She was discharged home, weight 86 lbs.  Here today for close HF follow-up. Weight at home stable at 87 lb. Notes occasional abdominal bloating and discomfort when attempting to eat. Has trouble gaining weight. Chest feels a little tight this am which tends to occur with fluid retention. Otherwise, feeling okay. No orthopnea, PND, abdominal bloating or lower extremity edema. Reports taking meds as prescribed. Last dose of Furoscix  was on 10/2.    Past Medical History:  Diagnosis Date   Acute respiratory failure (HCC) 05/05/2018   Acute systolic congestive heart failure (HCC) 02/03/2018   AICD (automatic cardioverter/defibrillator) present    Allergy    Anxiety    Asthma    DM2 (diabetes mellitus, type 2) (HCC) 10/20/2020   Hypertension    PAF (paroxysmal atrial fibrillation) (HCC)    Presence of permanent cardiac pacemaker    Prophylactic measure 08/03/14-08/19/14   Prophyl. 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   LIMA to LAD, SVG to D1, SVG to OM1, EVH via right thigh with implantation of Impella LD LVAD via direct aortic approach   Small cell lung cancer (HCC) 03/16/2014   Past Surgical History:  Procedure Laterality Date   BRONCHIAL BRUSHINGS  10/25/2020   Procedure: BRONCHIAL BRUSHINGS;  Surgeon: Shelah Lamar RAMAN, MD;  Location: [facility] ENDOSCOPY;  Service: Pulmonary;;   BRONCHIAL NEEDLE ASPIRATION BIOPSY  10/25/2020   Procedure: BRONCHIAL NEEDLE ASPIRATION BIOPSIES;  Surgeon: Shelah Lamar RAMAN, MD;  Location: [facility] ENDOSCOPY;  Service: Pulmonary;;   CARDIAC DEFIBRILLATOR PLACEMENT  08/15/2018  MDT Visia AF MRI VR ICD implanted by Dr Zulma for primary prevention of sudden   CESAREAN SECTION     CORONARY ARTERY BYPASS GRAFT N/A 02/03/2018   Procedure: CORONARY ARTERY BYPASS GRAFTING (CABG);  Surgeon: Dusty Sudie DEL, MD;  Location: [facility] OR;  Service: Open Heart Surgery;  Laterality: N/A;  Time 3 using left internal mammary artery and endoscopically  harvested right saphenous vein   CORONARY BALLOON ANGIOPLASTY N/A 02/03/2018   Procedure: CORONARY BALLOON ANGIOPLASTY;  Surgeon: Swaziland, Peter M, MD;  Location: [facility] INVASIVE CV LAB;  Service: Cardiovascular;  Laterality: N/A;   CORONARY STENT INTERVENTION N/A 02/12/2019   Procedure: CORONARY STENT INTERVENTION;  Surgeon: Darron Deatrice LABOR, MD;  Location: [facility] INVASIVE CV LAB;  Service: Cardiovascular;  Laterality: N/A;   CORONARY/GRAFT ACUTE MI REVASCULARIZATION N/A 02/03/2018   Procedure: Coronary/Graft Acute MI Revascularization;  Surgeon: Swaziland, Peter M, MD;  Location: [facility] INVASIVE CV LAB;  Service: Cardiovascular;  Laterality: N/A;   ENDOBRONCHIAL ULTRASOUND N/A 10/25/2020   Procedure: ENDOBRONCHIAL ULTRASOUND;  Surgeon: Shelah Lamar RAMAN, MD;  Location: [facility] ENDOSCOPY;  Service: Pulmonary;  Laterality: N/A;   FLEXIBLE BRONCHOSCOPY  10/25/2020   Procedure: FLEXIBLE BRONCHOSCOPY;  Surgeon: Shelah Lamar RAMAN, MD;  Location: [facility] ENDOSCOPY;  Service: Pulmonary;;   IABP INSERTION N/A 02/03/2018   Procedure: IABP Insertion;  Surgeon: Swaziland, Peter M, MD;  Location: [facility] INVASIVE CV LAB;  Service: Cardiovascular;  Laterality: N/A;   INTRAOPERATIVE TRANSESOPHAGEAL ECHOCARDIOGRAM N/A 02/03/2018   Procedure: INTRAOPERATIVE TRANSESOPHAGEAL ECHOCARDIOGRAM;  Surgeon: Dusty Sudie DEL, MD;  Location: [facility] OR;  Service: Open Heart Surgery;  Laterality: N/A;   LEFT HEART CATH AND CORONARY ANGIOGRAPHY N/A 02/03/2018   Procedure: LEFT HEART CATH AND CORONARY ANGIOGRAPHY;  Surgeon: Swaziland, Peter M, MD;  Location: [facility] INVASIVE CV LAB;  Service: Cardiovascular;  Laterality: N/A;   LEFT HEART CATH AND CORS/GRAFTS ANGIOGRAPHY N/A 02/12/2019   Procedure: LEFT HEART CATH AND CORS/GRAFTS ANGIOGRAPHY;  Surgeon: Darron Deatrice LABOR, MD;  Location: [facility] INVASIVE CV LAB;  Service: Cardiovascular;  Laterality: N/A;   MEDIASTINOSCOPY N/A 03/11/2014   Procedure: MEDIASTINOSCOPY;  Surgeon: Elspeth JAYSON Millers, MD;  Location: [facility] OR;  Service: Thoracic;   Laterality: N/A;   PLACEMENT OF IMPELLA LEFT VENTRICULAR ASSIST DEVICE  02/03/2018   Procedure: PLACEMENT OF IMPELLA LEFT VENTRICULAR ASSIST DEVICE LD;  Surgeon: Dusty Sudie DEL, MD;  Location: [facility] OR;  Service: Open Heart Surgery;;   REMOVAL OF IMPELLA LEFT VENTRICULAR ASSIST DEVICE N/A 02/08/2018   Procedure: REMOVAL OF IMPELLA LEFT VENTRICULAR ASSIST DEVICE;  Surgeon: Dusty Sudie DEL, MD;  Location: [facility] OR;  Service: Open Heart Surgery;  Laterality: N/A;   RIGHT HEART CATH N/A 02/03/2018   Procedure: RIGHT HEART CATH;  Surgeon: Swaziland, Peter M, MD;  Location: [facility] INVASIVE CV LAB;  Service: Cardiovascular;  Laterality: N/A;   RIGHT HEART CATH N/A 05/09/2018   Procedure: RIGHT HEART CATH;  Surgeon: Cherrie Toribio SAUNDERS, MD;  Location: [facility] INVASIVE CV LAB;  Service: Cardiovascular;  Laterality: N/A;   TEE WITHOUT CARDIOVERSION N/A 02/08/2018   Procedure: TRANSESOPHAGEAL ECHOCARDIOGRAM (TEE);  Surgeon: Dusty Sudie DEL, MD;  Location: [facility] OR;  Service: Open Heart Surgery;  Laterality: N/A;   TUBAL LIGATION     VIDEO BRONCHOSCOPY WITH ENDOBRONCHIAL ULTRASOUND N/A 03/11/2014   Procedure: VIDEO BRONCHOSCOPY WITH ENDOBRONCHIAL ULTRASOUND;  Surgeon: Elspeth JAYSON Millers, MD;  Location: [facility] OR;  Service: Thoracic;  Laterality: N/A;   Current Outpatient Medications  Medication Sig Dispense Refill   albuterol  (VENTOLIN  HFA) 108 (  90 Base) MCG/ACT inhaler Inhale 2 puffs into the lungs every 6 (six) hours as needed for wheezing or shortness of breath. 8.5 g 1   apixaban  (ELIQUIS ) 2.5 MG TABS tablet Take 1 tablet (2.5 mg total) by mouth 2 (two) times daily. 180 tablet 1   atorvastatin  (LIPITOR ) 80 MG tablet Take 1 tablet (80 mg total) by mouth daily. 90 tablet 0   cyclobenzaprine  (FLEXERIL ) 10 MG tablet Take 1 tablet (10 mg total) by mouth 3 (three) times daily as needed for muscle spasms. 60 tablet 3   dapagliflozin  propanediol (FARXIGA ) 10 MG TABS tablet Take 1 tablet (10 mg total) by mouth daily. 30 tablet 5   Digoxin   62.5 MCG TABS Take 0.0625 mg by mouth daily.     Dihydroxyaluminum Sod Carb (ROLAIDS PO) Take 1 tablet by mouth as needed.     Furosemide  (FUROSCIX ) 80 MG/10ML CTKT Inject 80 mg into the skin daily as needed (fluid and edema). Take as directed by the heart failure clinic only 10 each 0   hydrOXYzine  (VISTARIL ) 25 MG capsule TAKE 1 CAPSULE (25 MG TOTAL) BY MOUTH DAILY AS NEEDED. 90 capsule 1   ipratropium-albuterol  (DUONEB) 0.5-2.5 (3) MG/3ML SOLN TAKE 3 MLS BY NEBULIZATION EVERY 4 (FOUR) HOURS AS NEEDED (FOR SHORTNESS OF BREATH). 180 mL 3   ivabradine  (CORLANOR ) 7.5 MG TABS tablet TAKE 1 TABLET BY MOUTH TWICE A DAY WITH FOOD 120 tablet 1   megestrol  (MEGACE ) 20 MG tablet Take 1 tablet (20 mg total) by mouth 2 (two) times daily. 60 tablet 1   metolazone  (ZAROXOLYN ) 5 MG tablet Take 1 tablet (5 mg total) by mouth as needed (as instructed by Advanced HF clinic). 10 tablet 3   Multiple Vitamin (MULTIVITAMIN WITH MINERALS) TABS tablet Take 1 tablet by mouth daily. Gummies     nitroGLYCERIN  (NITROSTAT ) 0.4 MG SL tablet Place 1 tablet (0.4 mg total) under the tongue every 5 (five) minutes as needed for chest pain. 90 tablet 0   Nutritional Supplements (ENSURE ORIGINAL) LIQD Take 1 Bottle by mouth 2 (two) times daily as needed (poor appetite).     pantoprazole  (PROTONIX ) 40 MG tablet Take 1 tablet (40 mg total) by mouth daily at 6 (six) AM. 5 tablet 0   potassium chloride  (KLOR-CON ) 20 MEQ packet Take 80 mEq by mouth 2 (two) times daily. TAKE 80 MEQ TOTAL TWICE A DAY EXCEPT ON METOLAZONE  DAYS OF TUESDAYS TAKE (100 MEQ) 3 TIMES PER DAY. 360 packet 2   spironolactone  (ALDACTONE ) 25 MG tablet Take 1 tablet (25 mg total) by mouth daily.     torsemide  (DEMADEX ) 20 MG tablet Take 5 tablets (100 mg total) by mouth 2 (two) times daily.     No current facility-administered medications for this visit.    Allergies:   Lactose intolerance (gi) and Codeine   Social History:  The patient  reports that she quit  smoking about 10 years ago. Her smoking use included cigarettes. She started smoking about 30 years ago. She has a 20 pack-year smoking history. She has never used smokeless tobacco. She reports current alcohol use of about 5.0 standard drinks of alcohol per week. She reports current drug use. Drug: Marijuana.   Family History:  The patient's family history includes Cancer in her maternal grandmother; Diabetes in her paternal grandmother; Heart attack in her mother; Heart disease in her mother; Hypertension in her maternal grandmother and mother.   ROS:  Please see the history of present illness.   All other  systems are personally reviewed and negative.    Wt Readings from Last 3 Encounters:  09/16/24 39.8 kg (87 lb 12.8 oz)  09/02/24 41.2 kg (90 lb 12.8 oz)  08/28/24 39.3 kg (86 lb 9.6 oz)   There were no vitals taken for this visit.  Physical Exam General:  Thin, chronically ill appearing Neck: JVP 10-12 Cor: Regular rate & rhythm. No murmurs. Lungs: diminished Abdomen: soft, nontender, nondistended.  Extremities: no edema Neuro: alert & orientedx3. Affect pleasant   Device interrogation (personally reviewed): No AF or VT/VF, activity 0.8 hr/day, Optivol trending up since mid September, thoracic impedance below threshold   Assessment & Plan: 1. End-stage Chronic Systolic Heart Failure - EF has been 20-25% for the last 5 years.  - S/p Medtronic ICD and Barostim - Echo 1/25: EF < 20%, mod to severely reduced RV (in setting of RSV) - Numerous hospitalizations over past 18 months  - NYHA III, functional status confounded by COPD. Volume up on exam and by device (OptiVol significantly elevated). - Use furoscix  X 1 today and again on 10/10. Should only take 100 mg Torsemide  once these days. Other days should take 100 mg Torsemide  BID. May need furoscix  twice a week. Will ask remote monitoring RN, Mitzie Garner, to check device in 1 week. - Has metolazone  to use PRN but she does not feel  she responds very well to this - Continue digoxin  0.0625 mg daily - Continue Farxiga  10 mg daily. - Continue spironolactone  25 mg daily. - Continue ivabradine  7.5 mg bid. - Not a candidate for advanced therapies due to severity of lung disease and frailty.  - Labs today   2. Chronic Hypoxic Respiratory Failure/COPD  - Hx frequent admissions for AE COPD + A/C HFrEF.  - Currently stable   3. CKD IIIb - Creatinine baseline ~ 1.4-1.7.  - Continue Farxiga  10 mg daily - Labs today  4. PAF  - Regular on exam. No episodes on device -Continue Eliquis  2.5 mg BID, reduced dose (weight/creatinine). No bleeding issues  5. CAD - History of CABG x 2 2019 - s/p DES ostial ramus extending to left main (2020) - No angina - No ASA with need for anticoagulation - Continue high-intensity statin  6. H/o SCLC - Completed treatment 2015.  - CT chest (5/24) soft tissue thickening along mediastinum but no discrete mass    7. H/o Renal Infarct 2022 - On Eliquis .  - No bleeding issues.   8. GOC/FTT - She is DNR/DNI   9. Deconditioning - Needing some assistance with ADLs. - She has declined HH or PT/OT.  10. DM II -Last A1c was 6.5% -Continue Jardiance . Not on any other meds  Follow up 2 weeks with APP, 1 week device interrogation with Mitzie Garner, RN.  [name], PA-C 09/29/24

## 2024-09-30 ENCOUNTER — Encounter

## 2024-09-30 ENCOUNTER — Encounter (HOSPITAL_COMMUNITY)

## 2024-10-01 LAB — CUP PACEART REMOTE DEVICE CHECK
Battery Remaining Longevity: 70 mo
Battery Voltage: 2.99 V
Brady Statistic RV Percent Paced: 0 %
Date Time Interrogation Session: 20251020052827
HighPow Impedance: 58 Ohm
Implantable Lead Connection Status: 753985
Implantable Lead Implant Date: 20190905
Implantable Lead Location: 753860
Implantable Pulse Generator Implant Date: 20190905
Lead Channel Impedance Value: 323 Ohm
Lead Channel Impedance Value: 399 Ohm
Lead Channel Pacing Threshold Amplitude: 0.625 V
Lead Channel Pacing Threshold Pulse Width: 0.4 ms
Lead Channel Sensing Intrinsic Amplitude: 18 mV
Lead Channel Sensing Intrinsic Amplitude: 18 mV
Lead Channel Setting Pacing Amplitude: 2 V
Lead Channel Setting Pacing Pulse Width: 0.4 ms
Lead Channel Setting Sensing Sensitivity: 0.3 mV
Zone Setting Status: 755011
Zone Setting Status: 755011

## 2024-10-03 ENCOUNTER — Telehealth (HOSPITAL_COMMUNITY): Payer: Self-pay

## 2024-10-03 ENCOUNTER — Ambulatory Visit: Payer: Self-pay | Admitting: Family Medicine

## 2024-10-03 ENCOUNTER — Other Ambulatory Visit: Payer: Self-pay | Admitting: Family Medicine

## 2024-10-03 NOTE — Telephone Encounter (Signed)
 Called to confirm/remind patient of their appointment at the [facility] on 10/06/24.   Appointment:   [x] Confirmed  [] Left mess   [] No answer/No voice mail  [] VM Full/unable to leave message  [] Phone not in service  Patient reminded to bring all medications and/or complete list.  Confirmed patient has transportation. Gave directions, instructed to utilize valet parking.

## 2024-10-03 NOTE — Progress Notes (Signed)
 Remote ICD Transmission

## 2024-10-06 ENCOUNTER — Other Ambulatory Visit: Payer: Self-pay

## 2024-10-06 ENCOUNTER — Ambulatory Visit (HOSPITAL_COMMUNITY): Admission: RE | Admit: 2024-10-06 | Source: Ambulatory Visit

## 2024-10-06 ENCOUNTER — Ambulatory Visit: Attending: Cardiology

## 2024-10-06 ENCOUNTER — Telehealth: Payer: Self-pay

## 2024-10-06 ENCOUNTER — Ambulatory Visit: Payer: Self-pay | Admitting: Cardiology

## 2024-10-06 DIAGNOSIS — I5022 Chronic systolic (congestive) heart failure: Secondary | ICD-10-CM

## 2024-10-06 DIAGNOSIS — Z95810 Presence of automatic (implantable) cardiac defibrillator: Secondary | ICD-10-CM

## 2024-10-06 NOTE — Telephone Encounter (Signed)
 Remote ICM transmission received.  Attempted call to patient regarding ICM remote transmission and no answer.

## 2024-10-06 NOTE — Progress Notes (Cosign Needed Addendum)
 EPIC Encounter for ICM Monitoring  Patient Name: Brittany [name]  is a 63 y.o. female Date: 10/06/2024 Primary Care Physican: Tanda Bleacher, MD Primary Cardiologist: Bensimhon Electrophysiologist: Cindie 01/03/2024 Weight: 93-94 lbs  01/22/2024 Weight: 91 lbs (lowest weight 84 lbs) 04/18/2024 Office Weight:  99 lbs 05/27/2024 Weight: 87 lbs 07/14/2024 Weight: 89 lbs 09/22/2024 Weight: 89.10 lbs   Spoke with patient and heart failure questions reviewed.  Transmission results reviewed.  Pt reports she has a GI virus that she picked up from her nieces over the weekend.  She does feel like she has little fluid and does have Furoscix  if she needs it.     Since 09/22/2024 ICM Remote Transmission: Optivol Thoracic impedance suggesting possible fluid accumulation starting 08/27/2024 but improves when taking Furoscix  but has not returned to baseline normal.   Prescribed: Torsemide  20 mg take 5 tablets (100 mg total) by mouth two times a day.  Potassium 20 mEq packet take 80 mEq total by mouth twice day with Furoscix  and extra 40 mEq when using Metolazone .   Spironolactone  25 mg take 1 tablet (25 mg total) by mouth daily Furosemide  (Furoscix ) 80 mg/10 ml inject into skin daily as needed (fluid and edema). Take as directed by HF clinic. (Last Dose taken: 09/19/2024)    Labs: 09/16/2024 Creatinine 1.47, BUN 23, Potassium 3.9, Sodium 139, GFR 40, BNP 1,596.2 08/28/2024 Creatinine 1.38, BUN 43, Potassium 3.9, Sodium 135, GFR 43, BNP 2,271.0 08/21/2024 Creatinine 2.25, BUN 68, Potassium 4.1, Sodium 133, GFR 24  08/20/2024 Creatinine 1.82, BUN 48, Potassium 4.0, Sodium 136, GFR 31  08/19/2024 Creatinine 1.62, BUN 32, Potassium 4.2, Sodium 137, GFR 35  08/18/2024 Creatinine 1.28, BUN 21, Potassium 3.4, Sodium 140, GFR 47 07/30/2024 Creatinine 1.34, BUN 29, Potassium 3.1, Sodium 140, GFR 45, BNP 1,826.2 A complete set of results can be found in Results Review.   Recommendations:  She will take  Furoscix  if needed but is okay right now as far as fluid symptoms are concerned.  Advised to use ED if needed.  Copy sent to [facility] NP FYI and review.    Follow-up plan: ICM clinic phone appointment on 10/13/2024 (manual) 31 day & recheck fluid levels.   91 day device clinic remote transmission 12/29/2024.     EP/Cardiology Office Visits: 10/14/2024 with HF Clinic d/t virus (per appt note).  Recall 07/26/2024 with [name], PA   Copy of ICM check sent to Dr. Cindie.    Remote monitoring is medically necessary for Heart Failure Management.    Daily Thoracic Impedance ICM trend: 07/07/2024 through 10/06/2024.    12-14 Month Thoracic Impedance ICM trend:     Brittany GORMAN Garner, RN 10/06/2024 9:32 AM

## 2024-10-13 ENCOUNTER — Ambulatory Visit: Attending: Cardiology

## 2024-10-13 DIAGNOSIS — I5022 Chronic systolic (congestive) heart failure: Secondary | ICD-10-CM | POA: Diagnosis not present

## 2024-10-13 DIAGNOSIS — Z95810 Presence of automatic (implantable) cardiac defibrillator: Secondary | ICD-10-CM | POA: Diagnosis not present

## 2024-10-14 NOTE — Progress Notes (Signed)
 EPIC Encounter for ICM Monitoring  Patient Name: Brittany [name]  is a 63 y.o. female Date: 10/14/2024 Primary Care Physican: Tanda Bleacher, MD Primary Cardiologist: Bensimhon Electrophysiologist: Cindie 01/03/2024 Weight: 93-94 lbs  01/22/2024 Weight: 91 lbs (lowest weight 84 lbs) 04/18/2024 Office Weight:  99 lbs 05/27/2024 Weight: 87 lbs 07/14/2024 Weight: 89 lbs 10/06/2024 Weight: 89.10 lbs   Spoke with patient and heart failure questions reviewed.  Transmission results reviewed.  Pt reports she is feeling well at this time and does not feel like she has any fluid.   Since 09/22/2024 ICM Remote Transmission: Optivol Thoracic impedance suggesting ongoing possible fluid accumulation but improving.   Prescribed: Torsemide  20 mg take 5 tablets (100 mg total) by mouth two times a day.  Potassium 20 mEq packet take 80 mEq total by mouth twice day with Furoscix  and extra 40 mEq when using Metolazone .   Spironolactone  25 mg take 1 tablet (25 mg total) by mouth daily Furosemide  (Furoscix ) 80 mg/10 ml inject into skin daily as needed (fluid and edema). Take as directed by HF clinic. (Last Dose taken: 09/19/2024)    Labs: 09/16/2024 Creatinine 1.47, BUN 23, Potassium 3.9, Sodium 139, GFR 40, BNP 1,596.2 08/28/2024 Creatinine 1.38, BUN 43, Potassium 3.9, Sodium 135, GFR 43, BNP 2,271.0 08/21/2024 Creatinine 2.25, BUN 68, Potassium 4.1, Sodium 133, GFR 24  08/20/2024 Creatinine 1.82, BUN 48, Potassium 4.0, Sodium 136, GFR 31  08/19/2024 Creatinine 1.62, BUN 32, Potassium 4.2, Sodium 137, GFR 35  08/18/2024 Creatinine 1.28, BUN 21, Potassium 3.4, Sodium 140, GFR 47 07/30/2024 Creatinine 1.34, BUN 29, Potassium 3.1, Sodium 140, GFR 45, BNP 1,826.2 A complete set of results can be found in Results Review.   Recommendations:  Pt will take Furoscix  if she develops fluid symptoms.      Follow-up plan: ICM clinic phone appointment on 10/28/2024 to recheck fluid levels.   91 day device clinic  remote transmission 12/29/2024.     EP/Cardiology Office Visits: 10/22/2024 with HF Clinic.  Recall 07/26/2024 with [name], PA   Copy of ICM check sent to Dr. Cindie.     Remote monitoring is medically necessary for Heart Failure Management.    Daily Thoracic Impedance ICM trend: 07/15/2024 through 10/14/2024.    12-14 Month Thoracic Impedance ICM trend:     [name], RN 10/14/2024 2:29 PM

## 2024-10-21 ENCOUNTER — Telehealth: Payer: Self-pay

## 2024-10-21 NOTE — Telephone Encounter (Signed)
 Called to confirm/remind patient of their appointment at the [facility] on 10/22/24.   Appointment:   [] Confirmed  [] Left mess   [x] No answer/No voice mail  [] VM Full/unable to leave message  [] Phone not in service

## 2024-10-22 ENCOUNTER — Telehealth (HOSPITAL_COMMUNITY): Payer: Self-pay | Admitting: Licensed Clinical Social Worker

## 2024-10-22 ENCOUNTER — Other Ambulatory Visit (HOSPITAL_COMMUNITY): Payer: Self-pay

## 2024-10-22 ENCOUNTER — Encounter (HOSPITAL_COMMUNITY): Payer: Self-pay

## 2024-10-22 ENCOUNTER — Ambulatory Visit (HOSPITAL_COMMUNITY)
Admission: RE | Admit: 2024-10-22 | Discharge: 2024-10-22 | Disposition: A | Discharge status: Home / Self Care | Source: Ambulatory Visit | Attending: Family Medicine | Admitting: Family Medicine

## 2024-10-22 ENCOUNTER — Other Ambulatory Visit: Payer: Self-pay

## 2024-10-22 VITALS — BP 102/64 | HR 89 | Wt 89.0 lb

## 2024-10-22 DIAGNOSIS — I130 Hypertensive heart and chronic kidney disease with heart failure and stage 1 through stage 4 chronic kidney disease, or unspecified chronic kidney disease: Secondary | ICD-10-CM | POA: Diagnosis not present

## 2024-10-22 DIAGNOSIS — I252 Old myocardial infarction: Secondary | ICD-10-CM | POA: Insufficient documentation

## 2024-10-22 DIAGNOSIS — Z95810 Presence of automatic (implantable) cardiac defibrillator: Secondary | ICD-10-CM | POA: Diagnosis not present

## 2024-10-22 DIAGNOSIS — I5022 Chronic systolic (congestive) heart failure: Secondary | ICD-10-CM | POA: Diagnosis not present

## 2024-10-22 DIAGNOSIS — Z66 Do not resuscitate: Secondary | ICD-10-CM | POA: Insufficient documentation

## 2024-10-22 DIAGNOSIS — Z85118 Personal history of other malignant neoplasm of bronchus and lung: Secondary | ICD-10-CM | POA: Insufficient documentation

## 2024-10-22 DIAGNOSIS — I2510 Atherosclerotic heart disease of native coronary artery without angina pectoris: Secondary | ICD-10-CM | POA: Insufficient documentation

## 2024-10-22 DIAGNOSIS — Z951 Presence of aortocoronary bypass graft: Secondary | ICD-10-CM | POA: Diagnosis not present

## 2024-10-22 DIAGNOSIS — Z7901 Long term (current) use of anticoagulants: Secondary | ICD-10-CM | POA: Diagnosis not present

## 2024-10-22 DIAGNOSIS — J9611 Chronic respiratory failure with hypoxia: Secondary | ICD-10-CM | POA: Insufficient documentation

## 2024-10-22 DIAGNOSIS — Z9221 Personal history of antineoplastic chemotherapy: Secondary | ICD-10-CM | POA: Diagnosis not present

## 2024-10-22 DIAGNOSIS — E1122 Type 2 diabetes mellitus with diabetic chronic kidney disease: Secondary | ICD-10-CM | POA: Insufficient documentation

## 2024-10-22 DIAGNOSIS — J4489 Other specified chronic obstructive pulmonary disease: Secondary | ICD-10-CM | POA: Insufficient documentation

## 2024-10-22 DIAGNOSIS — Z955 Presence of coronary angioplasty implant and graft: Secondary | ICD-10-CM | POA: Diagnosis not present

## 2024-10-22 DIAGNOSIS — Z87891 Personal history of nicotine dependence: Secondary | ICD-10-CM | POA: Diagnosis not present

## 2024-10-22 DIAGNOSIS — C3490 Malignant neoplasm of unspecified part of unspecified bronchus or lung: Secondary | ICD-10-CM

## 2024-10-22 DIAGNOSIS — I25810 Atherosclerosis of coronary artery bypass graft(s) without angina pectoris: Secondary | ICD-10-CM

## 2024-10-22 DIAGNOSIS — I5084 End stage heart failure: Secondary | ICD-10-CM | POA: Diagnosis not present

## 2024-10-22 DIAGNOSIS — R627 Adult failure to thrive: Secondary | ICD-10-CM | POA: Diagnosis not present

## 2024-10-22 DIAGNOSIS — I480 Paroxysmal atrial fibrillation: Secondary | ICD-10-CM | POA: Insufficient documentation

## 2024-10-22 DIAGNOSIS — N1832 Chronic kidney disease, stage 3b: Secondary | ICD-10-CM | POA: Insufficient documentation

## 2024-10-22 DIAGNOSIS — Z923 Personal history of irradiation: Secondary | ICD-10-CM | POA: Insufficient documentation

## 2024-10-22 LAB — BASIC METABOLIC PANEL WITH GFR
Anion gap: 14 (ref 5–15)
BUN: 24 mg/dL — ABNORMAL HIGH (ref 8–23)
CO2: 21 mmol/L — ABNORMAL LOW (ref 22–32)
Calcium: 9.6 mg/dL (ref 8.9–10.3)
Chloride: 103 mmol/L (ref 98–111)
Creatinine, Ser: 1.49 mg/dL — ABNORMAL HIGH (ref 0.44–1.00)
GFR, Estimated: 39 mL/min — ABNORMAL LOW (ref >60)
Glucose, Bld: 165 mg/dL — ABNORMAL HIGH (ref 70–99)
Potassium: 5.1 mmol/L (ref 3.5–5.1)
Sodium: 138 mmol/L (ref 135–145)

## 2024-10-22 LAB — BRAIN NATRIURETIC PEPTIDE: B Natriuretic Peptide: 1589.7 pg/mL — ABNORMAL HIGH (ref 0.0–100.0)

## 2024-10-22 MED ORDER — METOLAZONE 5 MG PO TABS
5.0000 mg | ORAL_TABLET | ORAL | 3 refills | Status: AC | PRN
  Filled 2024-10-22: qty 10, 10d supply, fill #0

## 2024-10-22 MED ORDER — SPIRONOLACTONE 25 MG PO TABS
25.0000 mg | ORAL_TABLET | Freq: Every day | ORAL | 3 refills | Status: AC
  Filled 2024-10-22: qty 90, 90d supply, fill #0

## 2024-10-22 MED ORDER — POTASSIUM CHLORIDE 20 MEQ PO PACK
80.0000 meq | PACK | Freq: Two times a day (BID) | ORAL | 2 refills | Status: DC
  Filled 2024-10-22: qty 360, 45d supply, fill #0

## 2024-10-22 MED ORDER — DAPAGLIFLOZIN PROPANEDIOL 10 MG PO TABS
10.0000 mg | ORAL_TABLET | Freq: Every day | ORAL | 3 refills | Status: DC
  Filled 2024-10-22: qty 90, 90d supply, fill #0

## 2024-10-22 MED ORDER — IVABRADINE HCL 7.5 MG PO TABS
7.5000 mg | ORAL_TABLET | Freq: Two times a day (BID) | ORAL | 3 refills | Status: AC
  Filled 2024-10-22: qty 180, 90d supply, fill #0

## 2024-10-22 MED ORDER — TORSEMIDE 100 MG PO TABS
100.0000 mg | ORAL_TABLET | Freq: Two times a day (BID) | ORAL | 3 refills | Status: AC
  Filled 2024-10-22: qty 180, 90d supply, fill #0

## 2024-10-22 MED ORDER — APIXABAN 2.5 MG PO TABS
2.5000 mg | ORAL_TABLET | Freq: Two times a day (BID) | ORAL | 3 refills | Status: DC
  Filled 2024-10-22: qty 180, 90d supply, fill #0

## 2024-10-22 MED ORDER — DIGOXIN 62.5 MCG PO TABS
0.0625 mg | ORAL_TABLET | Freq: Every day | ORAL | 3 refills | Status: DC
  Filled 2024-10-22: qty 90, 90d supply, fill #0

## 2024-10-22 MED ORDER — ATORVASTATIN CALCIUM 80 MG PO TABS
80.0000 mg | ORAL_TABLET | Freq: Every day | ORAL | 3 refills | Status: AC
  Filled 2024-10-22: qty 90, 90d supply, fill #0

## 2024-10-22 MED ORDER — FUROSCIX 80 MG/10ML ~~LOC~~ CTKT
80.0000 mg | CARTRIDGE | Freq: Every day | SUBCUTANEOUS | 0 refills | Status: DC | PRN
  Filled 2024-10-22 – 2024-11-17 (×2): qty 10, 10d supply, fill #0

## 2024-10-22 NOTE — Progress Notes (Signed)
 [facility]   PCP:  Tanda Bleacher, MD  Oncology: Dr. Sherrod  HF Cardiologist: Dr Cherrie  HPI: Brittany [name]  is a 63 y.o. 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 Underwent emergent cath 02/03/18 showed LAD 100% stenosed, LCx 95% stenosed. Taken for emergent CABG 02/03/18. Required impella post op. Hospital course complicated by cardiogenic shock, HCAP, A fib, respiratory failure, and swallowing issues. She was discharged to SNF. Discharge weight 103 pounds.    In 2019 had multiple hospitalizations for HF and pleural effusion. Underwent pleurodesis at [facility].   Admitted 3/20 with NSTEMI and HF. Received DES to ostial ramus into distal left main based on cath below and underwent diuresis. Meds adjusted as tolerated. Echo with EF 25-30%.    Admitted twice in 2023 with low output requiring milrinone . Barostim placed.   Multiple CHF and AECOPD admission in May, August, October of 2024 and Janurary 2025. Echo 1/25 EF  < 20%, G2DD, RV moderately to severely reduced. She remained DNR.  Admitted 3/25 for CHF and AECOPD. Readmitted 4/25 for CHF/COPD. Was hypotensive. Moved to ICU. Required DBA and IV diuresis. Refused [facility] services.   Admitted 8/25 with a/c hypoxic respiratory failure and acute on chronic CHF. She required inotrope support with DBA which was wean off prior to discharge.   Remote ICD interrogation 08/18 indicated fluid accumulation since 08/12. We attempted to arrange for Furoscix  08/19 but she was unable to get to clinic to pick up the kit. She did use 5 mg metolazone .  Admitted 9/25 with a/c HF. Diuresed with IV lasix  and transiently required BiPap. GDMT titrated  but limited by AKI. She was discharged home, weight 86 lbs.  Today she returns for HF follow up. Overall feeling fine. Breathing has been very good for past 1.5 weeks. Has not required inhaler or breathing treatments. She has mild dyspnea walking on flat ground, does OK with ADLs and cooking. Denies palpitations, abnormal bleeding, CP, dizziness, edema, or PND/Orthopnea. Appetite ok. Weight at home 87 pounds. Taking all medications, out of Farxiga . Used Furoscix  1x last week.   Past Medical History:  Diagnosis Date   Acute respiratory failure (HCC) 05/05/2018   Acute systolic congestive heart failure (HCC) 02/03/2018   AICD (automatic cardioverter/defibrillator) present    Allergy    Anxiety    Asthma    DM2 (diabetes mellitus, type 2) (HCC) 10/20/2020   Hypertension    PAF (paroxysmal atrial fibrillation) (HCC)    Presence of permanent cardiac pacemaker    Prophylactic measure 08/03/14-08/19/14   Prophyl. 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   LIMA to LAD, SVG to D1, SVG to OM1, EVH via right thigh with implantation of Impella LD LVAD via direct aortic approach   Small cell lung cancer (HCC) 03/16/2014   Past Surgical History:  Procedure Laterality Date   BRONCHIAL BRUSHINGS  10/25/2020   Procedure: BRONCHIAL BRUSHINGS;  Surgeon: Shelah Lamar RAMAN, MD;  Location: [facility] ENDOSCOPY;  Service: Pulmonary;;   BRONCHIAL NEEDLE ASPIRATION BIOPSY  10/25/2020   Procedure: BRONCHIAL NEEDLE ASPIRATION BIOPSIES;  Surgeon: Shelah Lamar RAMAN, MD;  Location: [facility] ENDOSCOPY;  Service: Pulmonary;;   CARDIAC DEFIBRILLATOR PLACEMENT  08/15/2018   MDT Visia AF MRI VR ICD implanted by Dr Zulma for primary prevention of sudden   CESAREAN SECTION     CORONARY ARTERY BYPASS GRAFT N/A 02/03/2018   Procedure: CORONARY ARTERY BYPASS GRAFTING (CABG);  Surgeon: Dusty Sudie DEL, MD;  Location: [facility] OR;  Service: Open Heart Surgery;  Laterality: N/A;  Time 3 using left internal mammary artery and  endoscopically harvested right saphenous vein   CORONARY BALLOON ANGIOPLASTY N/A 02/03/2018   Procedure: CORONARY BALLOON ANGIOPLASTY;  Surgeon: Jordan, Peter M, MD;  Location: [facility] INVASIVE CV LAB;  Service: Cardiovascular;  Laterality: N/A;   CORONARY STENT INTERVENTION N/A 02/12/2019   Procedure: CORONARY STENT INTERVENTION;  Surgeon: Darron Deatrice LABOR, MD;  Location: [facility] INVASIVE CV LAB;  Service: Cardiovascular;  Laterality: N/A;   CORONARY/GRAFT ACUTE MI REVASCULARIZATION N/A 02/03/2018   Procedure: Coronary/Graft Acute MI Revascularization;  Surgeon: Jordan, Peter M, MD;  Location: [facility] INVASIVE CV LAB;  Service: Cardiovascular;  Laterality: N/A;   ENDOBRONCHIAL ULTRASOUND N/A 10/25/2020   Procedure: ENDOBRONCHIAL ULTRASOUND;  Surgeon: Shelah Lamar RAMAN, MD;  Location: [facility] ENDOSCOPY;  Service: Pulmonary;  Laterality: N/A;   FLEXIBLE BRONCHOSCOPY  10/25/2020   Procedure: FLEXIBLE BRONCHOSCOPY;  Surgeon: Shelah Lamar RAMAN, MD;  Location: [facility] ENDOSCOPY;  Service: Pulmonary;;   IABP INSERTION N/A 02/03/2018   Procedure: IABP Insertion;  Surgeon: Jordan, Peter M, MD;  Location: [facility] INVASIVE CV LAB;  Service: Cardiovascular;  Laterality: N/A;   INTRAOPERATIVE TRANSESOPHAGEAL ECHOCARDIOGRAM N/A 02/03/2018   Procedure: INTRAOPERATIVE TRANSESOPHAGEAL ECHOCARDIOGRAM;  Surgeon: Dusty Sudie DEL, MD;  Location: [facility] OR;  Service: Open Heart Surgery;  Laterality: N/A;   LEFT HEART CATH AND CORONARY ANGIOGRAPHY N/A 02/03/2018   Procedure: LEFT HEART CATH AND CORONARY ANGIOGRAPHY;  Surgeon: Jordan, Peter M, MD;  Location: [facility] INVASIVE CV LAB;  Service: Cardiovascular;  Laterality: N/A;   LEFT HEART CATH AND CORS/GRAFTS ANGIOGRAPHY N/A 02/12/2019   Procedure: LEFT HEART CATH AND CORS/GRAFTS ANGIOGRAPHY;  Surgeon: Darron Deatrice LABOR, MD;  Location: [facility] INVASIVE CV LAB;  Service: Cardiovascular;  Laterality: N/A;   MEDIASTINOSCOPY N/A 03/11/2014   Procedure: MEDIASTINOSCOPY;  Surgeon: Elspeth JAYSON Millers, MD;  Location: [facility] OR;   Service: Thoracic;  Laterality: N/A;   PLACEMENT OF IMPELLA LEFT VENTRICULAR ASSIST DEVICE  02/03/2018   Procedure: PLACEMENT OF IMPELLA LEFT VENTRICULAR ASSIST DEVICE LD;  Surgeon: Dusty Sudie DEL, MD;  Location: [facility] OR;  Service: Open Heart Surgery;;   REMOVAL OF IMPELLA LEFT VENTRICULAR ASSIST DEVICE N/A 02/08/2018   Procedure: REMOVAL OF IMPELLA LEFT VENTRICULAR ASSIST DEVICE;  Surgeon: Dusty Sudie DEL, MD;  Location: [facility] OR;  Service: Open Heart Surgery;  Laterality: N/A;   RIGHT HEART CATH N/A 02/03/2018   Procedure: RIGHT HEART CATH;  Surgeon: Jordan, Peter M, MD;  Location: [facility] INVASIVE CV LAB;  Service: Cardiovascular;  Laterality: N/A;   RIGHT HEART CATH N/A 05/09/2018   Procedure: RIGHT HEART CATH;  Surgeon: Cherrie Toribio SAUNDERS, MD;  Location: [facility] INVASIVE CV LAB;  Service: Cardiovascular;  Laterality: N/A;   TEE WITHOUT CARDIOVERSION N/A 02/08/2018   Procedure: TRANSESOPHAGEAL ECHOCARDIOGRAM (TEE);  Surgeon: Dusty Sudie DEL, MD;  Location: [facility] OR;  Service: Open Heart Surgery;  Laterality: N/A;   TUBAL LIGATION     VIDEO BRONCHOSCOPY WITH ENDOBRONCHIAL ULTRASOUND N/A 03/11/2014   Procedure: VIDEO BRONCHOSCOPY WITH ENDOBRONCHIAL ULTRASOUND;  Surgeon: Elspeth JAYSON Millers, MD;  Location: [facility] OR;  Service: Thoracic;  Laterality: N/A;   Current Outpatient Medications  Medication Sig Dispense Refill   albuterol  (  VENTOLIN  HFA) 108 (90 Base) MCG/ACT inhaler Inhale 2 puffs into the lungs every 6 (six) hours as needed for wheezing or shortness of breath. 8.5 g 1   apixaban  (ELIQUIS ) 2.5 MG TABS tablet Take 1 tablet (2.5 mg total) by mouth 2 (two) times daily. 180 tablet 1   atorvastatin  (LIPITOR ) 80 MG tablet Take 1 tablet (80 mg total) by mouth daily. 90 tablet 0   cyclobenzaprine  (FLEXERIL ) 10 MG tablet Take 1 tablet (10 mg total) by mouth 3 (three) times daily as needed for muscle spasms. 60 tablet 3   dapagliflozin  propanediol (FARXIGA ) 10 MG TABS tablet Take 1 tablet (10 mg total) by mouth daily. 30  tablet 5   Digoxin  62.5 MCG TABS Take 0.0625 mg by mouth daily.     Dihydroxyaluminum Sod Carb (ROLAIDS PO) Take 1 tablet by mouth as needed.     Furosemide  (FUROSCIX ) 80 MG/10ML CTKT Inject 80 mg into the skin daily as needed (fluid and edema). Take as directed by the heart failure clinic only 10 each 0   hydrOXYzine  (VISTARIL ) 25 MG capsule TAKE 1 CAPSULE (25 MG TOTAL) BY MOUTH DAILY AS NEEDED. 90 capsule 1   ipratropium-albuterol  (DUONEB) 0.5-2.5 (3) MG/3ML SOLN TAKE 3 MLS BY NEBULIZATION EVERY 4 (FOUR) HOURS AS NEEDED (FOR SHORTNESS OF BREATH). 540 mL 1   ivabradine  (CORLANOR ) 7.5 MG TABS tablet TAKE 1 TABLET BY MOUTH TWICE A DAY WITH FOOD 120 tablet 1   megestrol  (MEGACE ) 20 MG tablet Take 1 tablet (20 mg total) by mouth 2 (two) times daily. 60 tablet 1   metolazone  (ZAROXOLYN ) 5 MG tablet Take 1 tablet (5 mg total) by mouth as needed (as instructed by Advanced HF clinic). 10 tablet 3   Multiple Vitamin (MULTIVITAMIN WITH MINERALS) TABS tablet Take 1 tablet by mouth daily. Gummies     nitroGLYCERIN  (NITROSTAT ) 0.4 MG SL tablet Place 1 tablet (0.4 mg total) under the tongue every 5 (five) minutes as needed for chest pain. 90 tablet 0   Nutritional Supplements (ENSURE ORIGINAL) LIQD Take 1 Bottle by mouth 2 (two) times daily as needed (poor appetite).     pantoprazole  (PROTONIX ) 40 MG tablet Take 1 tablet (40 mg total) by mouth daily at 6 (six) AM. 5 tablet 0   potassium chloride  (KLOR-CON ) 20 MEQ packet Take 80 mEq by mouth 2 (two) times daily. TAKE 80 MEQ TOTAL TWICE A DAY EXCEPT ON METOLAZONE  DAYS OF TUESDAYS TAKE (100 MEQ) 3 TIMES PER DAY. 360 packet 2   spironolactone  (ALDACTONE ) 25 MG tablet Take 1 tablet (25 mg total) by mouth daily.     torsemide  (DEMADEX ) 20 MG tablet Take 5 tablets (100 mg total) by mouth 2 (two) times daily.     No current facility-administered medications for this encounter.    Allergies:   Lactose intolerance (gi) and Codeine   Social History:  The patient   reports that she quit smoking about 10 years ago. Her smoking use included cigarettes. She started smoking about 30 years ago. She has a 20 pack-year smoking history. She has never used smokeless tobacco. She reports current alcohol use of about 5.0 standard drinks of alcohol per week. She reports current drug use. Drug: Marijuana.   Family History:  The patient's family history includes Cancer in her maternal grandmother; Diabetes in her paternal grandmother; Heart attack in her mother; Heart disease in her mother; Hypertension in her maternal grandmother and mother.   ROS:  Please see the history of present illness.  All other systems are personally reviewed and negative.    Wt Readings from Last 3 Encounters:  10/22/24 40.4 kg (89 lb)  09/16/24 39.8 kg (87 lb 12.8 oz)  09/02/24 41.2 kg (90 lb 12.8 oz)   BP 102/64   Pulse 89   Wt 40.4 kg (89 lb)   SpO2 100%   BMI 17.67 kg/m   Physical Exam General:  NAD. No resp difficulty, walked into clinic, chronically-ill appearing HEENT: Normal Neck: Supple. JVP 10-12 Cor: Regular rate & rhythm. No rubs, gallops or murmurs. Lungs: wheezes, reduced in bases Abdomen: Soft, nontender, nondistended.  Extremities: No cyanosis, clubbing, rash, edema Neuro: Alert & oriented x 3, moves all 4 extremities w/o difficulty. Affect pleasant.  Device interrogation (personally reviewed): OptiVol up since 08/2024, no AF or VT, 0.9 hr/day activity, 100% VS  Assessment & Plan: 1. End-stage Chronic Systolic Heart Failure - EF has been 20-25% for the last 5 years.  - S/p Medtronic ICD and Barostim - Echo 1/25: EF < 20%, mod to severely reduced RV (in setting of RSV) - Numerous hospitalizations over past 18 months  - NYHA III, functional status confounded by COPD. Volume up on exam and by device (OptiVol significantly elevated). - Use Furoscix  bid + 80 KCL bid x 2 days, hold torsemide  while using Furoscix . - After 2 days, resume torsemide  100 mg bid + 80 KCL  bid - Has metolazone  to use PRN but she does not feel she responds very well to this - Continue digoxin  0.0625 mg daily. Dig level <0.7 08/28/24 - Restart Farxiga  10 mg daily. - Continue spironolactone  25 mg daily. - Continue ivabradine  7.5 mg bid. - Not a candidate for advanced therapies due to severity of lung disease and frailty.  - Labs today, will need repeat BMET at follow up   2. Chronic Hypoxic Respiratory Failure/COPD  - Hx frequent admissions for AE COPD + A/C HFrEF.  - Currently stable   3. CKD IIIb - Creatinine baseline ~ 1.4-1.7.  - Restart Farxiga  10 mg daily - Labs today  4. PAF  - Regular on exam. No episodes on device - Continue Eliquis  2.5 mg bid, reduced dose (weight/creatinine). No bleeding issues  5. CAD - History of CABG x 2 2019 - s/p DES ostial ramus extending to left main (2020) - No angina - No ASA with need for anticoagulation - Continue high-intensity statin  6. H/o SCLC - Completed treatment 2015.  - CT chest (5/24) soft tissue thickening along mediastinum but no discrete mass    7. H/o Renal Infarct 2022 - On Eliquis .  - No bleeding issues.   8. GOC/FTT - She is DNR/DNI - She has declined HH PT/OT  9. DM II - Last A1c was 6.5% - Per PCP  Follow up in 1 week with APP for fluid check. She is high-risk for re-admission  [name], FNP-BC 10/22/24

## 2024-10-22 NOTE — Patient Instructions (Addendum)
 Your provider has order Furoscix  for you. This is an on-body infuser that gives you a dose of Furosemide .   It will be shipped to your home from [facility], they will call you before shipping  Ensure you write down the time you start your infusion so that if there is a problem you will know how long the infusion lasted  Use Furoscix  only AS DIRECTED by our office  Dosing Directions:   Day 1= Thursday 10/23/24 1 kit Twice daily with 80 mEq ( 4 packets of potassium ) Twice daily   Day 2= Friday 10/24/2024 1 kit Twice daily with 80 mEq ( 4 packets of potassium) Twice daily   Hold torsemide  these 2 days  ON SATURDAY 11/15/205 RESTART torsemide  100 mg Twice daily   Labs done today, your results will be available in MyChart, we will contact you for abnormal readings.  Your physician recommends that you schedule a follow-up appointment in: 1 week.  If you have any questions or concerns before your next appointment please send us  a message through [place] or call our office at [phone].    TO LEAVE A MESSAGE FOR THE NURSE SELECT OPTION 2, PLEASE LEAVE A MESSAGE INCLUDING: YOUR NAME DATE OF BIRTH CALL BACK NUMBER REASON FOR CALL**this is important as we prioritize the call backs  YOU WILL RECEIVE A CALL BACK THE SAME DAY AS LONG AS YOU CALL BEFORE 4:00 PM  At the [facility], you and your health needs are our priority. As part of our continuing mission to provide you with exceptional heart care, we have created designated Provider Care Teams. These Care Teams include your primary Cardiologist (physician) and Advanced Practice Providers (APPs- Physician Assistants and Nurse Practitioners) who all work together to provide you with the care you need, when you need it.   You may see any of the following providers on your designated Care Team at your next follow up: Dr Toribio Fuel Dr Ezra Shuck Dr. Morene Brownie Greig Mosses, NP Caffie Shed, [place] [facility] [place], [place] Beckey Coe, NP Jordan Lee, NP Ellouise Class, NP Tinnie Redman, PharmD Jaun Bash, PharmD   Please be sure to bring in all your medications bottles to every appointment.    Thank you for choosing [facility]

## 2024-10-22 NOTE — Telephone Encounter (Signed)
 H&V Care Navigation CSW Progress Note  Clinical Social Worker received call from pt requesting assistance with taxi to get to appt today- able to arrange through [employer] for 2:30 today.  10/22/2024  Brittany [name]  DOB: 1961/03/02 MRN: [id number]   RIDER WAIVER AND RELEASE OF LIABILITY  For the purposes of helping with transportation needs, [name] partners with outside transportation providers (taxi companies, [place], catering manager.) to give [facility] patients or other approved people the choice of on-demand rides Public Librarian) to our buildings for non-emergency visits.  By using [employer], I, the person signing this document, on behalf of myself and/or any legal minors (in my care using the [employer]), agree:  Science Writer given to me are supplied by independent, outside transportation providers who do not work for, or have any affiliation with, [employer]. [place] is not a transportation company. [facility] has no control over the quality or safety of the rides I get using [employer]. [name] has no control over whether any outside ride will happen on time or not. [name] gives no guarantee on the reliability, quality, safety, or availability on any rides, or that no mistakes will happen. I know and accept that traveling by vehicle (car, truck, SVU, fleeta, bus, taxi, etc.) has risks of serious injuries such as disability, being paralyzed, and death. I know and agree the risk of using [employer] is mine alone, and not [employer]. [employer] are provided as is and as are available. The transportation providers are in charge for all inspections and care of the vehicles used to provide these rides. I agree not to take legal action against [name], its agents, employees, officers, directors, representatives, insurers, attorneys, assigns, successors, subsidiaries, and affiliates at any time for any reasons related  directly or indirectly to using [employer]. I also agree not to take legal action against [facility] or its affiliates for any injury, death, or damage to property caused by or related to using [employer]. I have read this Waiver and Release of Liability, and I understand the terms used in it and their legal meaning. This Waiver is freely and voluntarily given with the understanding that my right (or any legal minors) to legal action against  relating to [employer] is knowingly given up to use these services.   I attest that I read the Ride Waiver and Release of Liability to [name] , gave Ms. [name]  the opportunity to ask questions and answered the questions asked (if any). I affirm that [name]  then provided consent for assistance with transportation.     Sabine Tenenbaum H Aviel Davalos

## 2024-10-23 ENCOUNTER — Other Ambulatory Visit: Payer: Self-pay

## 2024-10-23 ENCOUNTER — Other Ambulatory Visit (HOSPITAL_COMMUNITY): Payer: Self-pay

## 2024-10-23 ENCOUNTER — Encounter: Payer: Self-pay | Admitting: Pharmacist

## 2024-10-23 NOTE — Progress Notes (Signed)
 Pharmacy received a new Furoscix  prescription for patient. Called to coordinate delivery and had to leave voicemail.

## 2024-10-24 ENCOUNTER — Other Ambulatory Visit: Payer: Self-pay

## 2024-10-24 ENCOUNTER — Other Ambulatory Visit (HOSPITAL_COMMUNITY): Payer: Self-pay

## 2024-10-24 ENCOUNTER — Ambulatory Visit (HOSPITAL_COMMUNITY): Payer: Self-pay | Admitting: Family Medicine

## 2024-10-28 ENCOUNTER — Other Ambulatory Visit: Payer: Self-pay

## 2024-10-28 ENCOUNTER — Ambulatory Visit: Attending: Cardiology

## 2024-10-28 DIAGNOSIS — Z95810 Presence of automatic (implantable) cardiac defibrillator: Secondary | ICD-10-CM

## 2024-10-28 DIAGNOSIS — I5022 Chronic systolic (congestive) heart failure: Secondary | ICD-10-CM

## 2024-10-28 NOTE — Progress Notes (Signed)
 EPIC Encounter for ICM Monitoring  Patient Name: Brittany [name]  is a 63 y.o. female Date: 10/28/2024 Primary Care Physican: Tanda Bleacher, MD Primary Cardiologist: Bensimhon Electrophysiologist: Cindie 01/03/2024 Weight: 93-94 lbs  01/22/2024 Weight: 91 lbs (lowest weight 84 lbs) 04/18/2024 Office Weight:  99 lbs 05/27/2024 Weight: 87 lbs 07/14/2024 Weight: 89 lbs 10/06/2024 Weight: 89.10 lbs 10/28/2024 Weight: 87 lbs   Spoke with patient and heart failure questions reviewed.  Transmission results reviewed.  Pt asymptomatic for fluid accumulation.     Since 10/13/2024 ICM Remote Transmission: Optivol Thoracic impedance suggesting significant improvement after 10/22/2024 HF clinic recommendation to use Furoscix  bid + 80 KCL bid x 2 days, hold torsemide  while using Furoscix .   Prescribed: Torsemide  100 mg take 1 tablet (s) (100 mg total) by mouth two times a day.  Potassium 20 mEq packet take 80 mEq total by mouth twice day with Furoscix  and extra 40 mEq when using Metolazone .   Spironolactone  25 mg take 1 tablet (25 mg total) by mouth daily Furosemide  (Furoscix ) 80 mg/10 ml inject into skin daily as needed (fluid and edema). Take as directed by HF clinic.  10/28/2024 She is taking Furoscix  on Tuesday and Thursdays once a day with the Torsemide  dosage in the evenings.   Labs: 10/22/2024 Creatinine 1.49, BUN 24, Potassium 5.1, Sodium 138, GFR 39, BNP 1,589.7 09/16/2024 Creatinine 1.47, BUN 23, Potassium 3.9, Sodium 139, GFR 40, BNP 1,596.2 08/28/2024 Creatinine 1.38, BUN 43, Potassium 3.9, Sodium 135, GFR 43, BNP 2,271.0 08/21/2024 Creatinine 2.25, BUN 68, Potassium 4.1, Sodium 133, GFR 24  08/20/2024 Creatinine 1.82, BUN 48, Potassium 4.0, Sodium 136, GFR 31  08/19/2024 Creatinine 1.62, BUN 32, Potassium 4.2, Sodium 137, GFR 35  08/18/2024 Creatinine 1.28, BUN 21, Potassium 3.4, Sodium 140, GFR 47 07/30/2024 Creatinine 1.34, BUN 29, Potassium 3.1, Sodium 140, GFR 45, BNP 1,826.2 A  complete set of results can be found in Results Review.   Recommendations:  No changes and encouraged to call if experiencing any fluid symptoms.   Follow-up plan: ICM clinic phone appointment on 11/10/2024 to recheck fluid levels.   91 day device clinic remote transmission 12/29/2024.     EP/Cardiology Office Visits: 10/31/2024 with HF Clinic.  Recall 07/26/2024 with [name], PA   Copy of ICM check sent to Dr. Cindie.     Remote monitoring is medically necessary for Heart Failure Management.    Daily Thoracic Impedance ICM trend: 07/29/2024 through 10/28/2024.    12-14 Month Thoracic Impedance ICM trend:     [name], RN 10/28/2024 7:19 AM

## 2024-10-29 ENCOUNTER — Encounter (HOSPITAL_COMMUNITY): Payer: Self-pay

## 2024-10-31 ENCOUNTER — Ambulatory Visit (HOSPITAL_COMMUNITY)

## 2024-11-03 ENCOUNTER — Telehealth: Payer: Self-pay

## 2024-11-03 ENCOUNTER — Telehealth (HOSPITAL_COMMUNITY): Payer: Self-pay | Admitting: Adult Health
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
## 2024-11-03 NOTE — Telephone Encounter (Signed)
 Called to confirm/remind patient of their appointment at the [facility] on 11/03/2024.   Appointment:   [] Confirmed  [] Left mess   [x] No answer/No voice mail  [] VM Full/unable to leave message  [] Phone not in service  Patient reminded to bring all medications and/or complete list.  Confirmed patient has transportation. Gave directions, instructed to utilize valet parking.

## 2024-11-04 ENCOUNTER — Ambulatory Visit (HOSPITAL_COMMUNITY): Admitting: Adult Health

## 2024-11-08 ENCOUNTER — Other Ambulatory Visit (HOSPITAL_COMMUNITY): Payer: Self-pay | Admitting: Internal Medicine

## 2024-11-08 DIAGNOSIS — I480 Paroxysmal atrial fibrillation: Secondary | ICD-10-CM

## 2024-11-10 ENCOUNTER — Ambulatory Visit: Attending: Cardiology

## 2024-11-10 ENCOUNTER — Telehealth: Payer: Self-pay

## 2024-11-10 ENCOUNTER — Telehealth (HOSPITAL_COMMUNITY): Payer: Self-pay

## 2024-11-10 DIAGNOSIS — I5022 Chronic systolic (congestive) heart failure: Secondary | ICD-10-CM

## 2024-11-10 DIAGNOSIS — Z95810 Presence of automatic (implantable) cardiac defibrillator: Secondary | ICD-10-CM

## 2024-11-10 NOTE — Telephone Encounter (Signed)
 Called to confirm/remind patient of their appointment at the [facility] on 11/11/24.   Appointment:   [] Confirmed  [] Left mess   [x] No answer/No voice mail  [] VM Full/unable to leave message  [] Phone not in service  Patient reminded to bring all medications and/or complete list.  Confirmed patient has transportation. Gave directions, instructed to utilize valet parking.

## 2024-11-10 NOTE — Progress Notes (Unsigned)
 EPIC Encounter for ICM Monitoring  Patient Name: Brittany [name]  is a 63 y.o. female Date: 11/10/2024 Primary Care Physican: Tanda Bleacher, MD Primary Cardiologist: Bensimhon Electrophysiologist: Cindie 01/03/2024 Weight: 93-94 lbs  01/22/2024 Weight: 91 lbs (lowest weight 84 lbs) 04/18/2024 Office Weight:  99 lbs 05/27/2024 Weight: 87 lbs 07/14/2024 Weight: 89 lbs 10/06/2024 Weight: 89.10 lbs 10/28/2024 Weight: 87 lbs   Attempted call to patient and unable to reach.  Transmission results reviewed.     Since 10/28/2024 ICM Remote Transmission: Optivol Thoracic impedance suggesting ongoing possible fluid accumulation and uses Furoscix  as prescribed.   Prescribed: Torsemide  100 mg take 1 tablet (s) (100 mg total) by mouth two times a day.  Potassium 20 mEq packet take 80 mEq total by mouth twice day with Furoscix  and extra 40 mEq when using Metolazone .   Spironolactone  25 mg take 1 tablet (25 mg total) by mouth daily Furosemide  (Furoscix ) 80 mg/10 ml inject into skin daily as needed (fluid and edema). Take as directed by HF clinic.  10/28/2024 She is taking Furoscix  on Tuesday and Thursdays once a day with the Torsemide  dosage in the evenings.   Labs: 10/22/2024 Creatinine 1.49, BUN 24, Potassium 5.1, Sodium 138, GFR 39, BNP 1,589.7 09/16/2024 Creatinine 1.47, BUN 23, Potassium 3.9, Sodium 139, GFR 40, BNP 1,596.2 08/28/2024 Creatinine 1.38, BUN 43, Potassium 3.9, Sodium 135, GFR 43, BNP 2,271.0 08/21/2024 Creatinine 2.25, BUN 68, Potassium 4.1, Sodium 133, GFR 24  08/20/2024 Creatinine 1.82, BUN 48, Potassium 4.0, Sodium 136, GFR 31  08/19/2024 Creatinine 1.62, BUN 32, Potassium 4.2, Sodium 137, GFR 35  08/18/2024 Creatinine 1.28, BUN 21, Potassium 3.4, Sodium 140, GFR 47 07/30/2024 Creatinine 1.34, BUN 29, Potassium 3.1, Sodium 140, GFR 45, BNP 1,826.2 A complete set of results can be found in Results Review.   Recommendations:  Unable to reach.     Follow-up plan: ICM clinic  phone appointment on 11/24/2024.   91 day device clinic remote transmission 12/29/2024.     EP/Cardiology Office Visits:  11/11/2024 with HF Clinic.  Recall 07/26/2024 with [name], PA   Copy of ICM check sent to Dr. Cindie.     Remote monitoring is medically necessary for Heart Failure Management.    Daily Thoracic Impedance ICM trend: 08/11/2024 through 11/10/2024.    12-14 Month Thoracic Impedance ICM trend:     [name], RN 11/10/2024 9:54 AM

## 2024-11-10 NOTE — Telephone Encounter (Signed)
 Remote ICM transmission received.  Attempted call to patient regarding ICM remote transmission and no answer.

## 2024-11-11 ENCOUNTER — Ambulatory Visit (HOSPITAL_COMMUNITY): Admission: RE | Admit: 2024-11-11 | Discharge: 2024-11-11 | Disposition: A | Source: Ambulatory Visit

## 2024-11-11 NOTE — Progress Notes (Signed)
 Spoke with patient and heart failure questions reviewed.  Transmission results reviewed.  She feels the tightness underneath her breast which is usaully an indication for her she has fluid.  Also feeling SOB and did not rest well last night since it was harder to breath.  She has some sinus congestion and headache.  She is cancelling HF clinic appt today and will reschedule.  She has not had any Fusoscix in the past week but will take a dose today and will take a 2nd dose tomorrow if fluid symptoms don't improve.  She knows to use ER if symptoms worsen.

## 2024-11-11 NOTE — Progress Notes (Incomplete)
 [facility]   PCP:  Tanda Bleacher, MD  Oncology: Dr. Sherrod  HF Cardiologist: Dr Cherrie  HPI: Brittany [name]  is a 63 y.o. 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 Underwent emergent cath 02/03/18 showed LAD 100% stenosed, LCx 95% stenosed. Taken for emergent CABG 02/03/18. Required impella post op. Hospital course complicated by cardiogenic shock, HCAP, A fib, respiratory failure, and swallowing issues. She was discharged to SNF. Discharge weight 103 pounds.    In 2019 had multiple hospitalizations for HF and pleural effusion. Underwent pleurodesis at [facility].   Admitted 3/20 with NSTEMI and HF. Received DES to ostial ramus into distal left main based on cath below and underwent diuresis. Meds adjusted as tolerated. Echo with EF 25-30%.    Admitted twice in 2023 with low output requiring milrinone . Barostim placed.   Multiple CHF and AECOPD admission in May, August, October of 2024 and Janurary 2025. Echo 1/25 EF  < 20%, G2DD, RV moderately to severely reduced. She remained DNR.  Admitted 3/25 for CHF and AECOPD. Readmitted 4/25 for CHF/COPD. Was hypotensive. Moved to ICU. Required DBA and IV diuresis. Refused [facility] services.   Admitted 8/25 with a/c hypoxic respiratory failure and acute on chronic CHF. She required inotrope support with DBA which was wean off prior to discharge.   Remote ICD interrogation 08/18 indicated fluid accumulation since 08/12. We attempted to arrange for Furoscix  08/19 but she was unable to get to clinic to pick up the kit. She did use 5 mg metolazone .  Admitted 9/25 with a/c HF. Diuresed with IV lasix  and transiently required BiPap. GDMT titrated  but limited by AKI. She was discharged home, weight 86 lbs.  AHF f/u 11/25 volume overloaded. Given Furoscix .   Today she returns for AHF follow up. Overall feeling ***. Denies palpitations, CP, dizziness, edema, or PND/Orthopnea. *** SOB. Appetite ok. No fever or chills. Weight at home *** pounds. Taking all medications. Denies ETOH, tobacco or drug use.   Past Medical History:  Diagnosis Date   Acute respiratory failure (HCC) 05/05/2018   Acute systolic congestive heart failure (HCC) 02/03/2018   AICD (automatic cardioverter/defibrillator) present    Allergy    Anxiety    Asthma    DM2 (diabetes mellitus, type 2) (HCC) 10/20/2020   Hypertension    PAF (paroxysmal atrial fibrillation) (HCC)    Presence of permanent cardiac pacemaker    Prophylactic measure 08/03/14-08/19/14   Prophyl. 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   LIMA to LAD, SVG to D1, SVG to OM1, EVH via right thigh with implantation of Impella LD LVAD via direct aortic approach   Small cell lung cancer (HCC) 03/16/2014   Past Surgical History:  Procedure Laterality Date   BRONCHIAL BRUSHINGS  10/25/2020   Procedure: BRONCHIAL BRUSHINGS;  Surgeon: Shelah Lamar RAMAN, MD;  Location: [facility] ENDOSCOPY;  Service: Pulmonary;;   BRONCHIAL NEEDLE ASPIRATION BIOPSY  10/25/2020   Procedure: BRONCHIAL NEEDLE ASPIRATION BIOPSIES;  Surgeon: Shelah Lamar RAMAN, MD;  Location: [facility] ENDOSCOPY;  Service: Pulmonary;;   CARDIAC DEFIBRILLATOR PLACEMENT  08/15/2018   MDT Visia AF MRI VR ICD implanted by Dr Zulma for primary prevention of sudden  CESAREAN SECTION     CORONARY ARTERY BYPASS GRAFT N/A 02/03/2018   Procedure: CORONARY ARTERY BYPASS GRAFTING (CABG);  Surgeon: Dusty Sudie DEL, MD;  Location: [facility] OR;  Service: Open Heart Surgery;  Laterality: N/A;  Time 3 using left internal mammary artery and endoscopically harvested right saphenous vein   CORONARY BALLOON ANGIOPLASTY N/A 02/03/2018   Procedure: CORONARY BALLOON  ANGIOPLASTY;  Surgeon: Jordan, Peter M, MD;  Location: [facility] INVASIVE CV LAB;  Service: Cardiovascular;  Laterality: N/A;   CORONARY STENT INTERVENTION N/A 02/12/2019   Procedure: CORONARY STENT INTERVENTION;  Surgeon: Darron Deatrice LABOR, MD;  Location: [facility] INVASIVE CV LAB;  Service: Cardiovascular;  Laterality: N/A;   CORONARY/GRAFT ACUTE MI REVASCULARIZATION N/A 02/03/2018   Procedure: Coronary/Graft Acute MI Revascularization;  Surgeon: Jordan, Peter M, MD;  Location: [facility] INVASIVE CV LAB;  Service: Cardiovascular;  Laterality: N/A;   ENDOBRONCHIAL ULTRASOUND N/A 10/25/2020   Procedure: ENDOBRONCHIAL ULTRASOUND;  Surgeon: Shelah Lamar RAMAN, MD;  Location: [facility] ENDOSCOPY;  Service: Pulmonary;  Laterality: N/A;   FLEXIBLE BRONCHOSCOPY  10/25/2020   Procedure: FLEXIBLE BRONCHOSCOPY;  Surgeon: Shelah Lamar RAMAN, MD;  Location: [facility] ENDOSCOPY;  Service: Pulmonary;;   IABP INSERTION N/A 02/03/2018   Procedure: IABP Insertion;  Surgeon: Jordan, Peter M, MD;  Location: [facility] INVASIVE CV LAB;  Service: Cardiovascular;  Laterality: N/A;   INTRAOPERATIVE TRANSESOPHAGEAL ECHOCARDIOGRAM N/A 02/03/2018   Procedure: INTRAOPERATIVE TRANSESOPHAGEAL ECHOCARDIOGRAM;  Surgeon: Dusty Sudie DEL, MD;  Location: [facility] OR;  Service: Open Heart Surgery;  Laterality: N/A;   LEFT HEART CATH AND CORONARY ANGIOGRAPHY N/A 02/03/2018   Procedure: LEFT HEART CATH AND CORONARY ANGIOGRAPHY;  Surgeon: Jordan, Peter M, MD;  Location: [facility] INVASIVE CV LAB;  Service: Cardiovascular;  Laterality: N/A;   LEFT HEART CATH AND CORS/GRAFTS ANGIOGRAPHY N/A 02/12/2019   Procedure: LEFT HEART CATH AND CORS/GRAFTS ANGIOGRAPHY;  Surgeon: Darron Deatrice LABOR, MD;  Location: [facility] INVASIVE CV LAB;  Service: Cardiovascular;  Laterality: N/A;   MEDIASTINOSCOPY N/A 03/11/2014   Procedure: MEDIASTINOSCOPY;  Surgeon: Elspeth JAYSON Millers, MD;  Location: [facility] OR;  Service: Thoracic;  Laterality: N/A;   PLACEMENT OF IMPELLA LEFT VENTRICULAR ASSIST DEVICE  02/03/2018   Procedure: PLACEMENT  OF IMPELLA LEFT VENTRICULAR ASSIST DEVICE LD;  Surgeon: Dusty Sudie DEL, MD;  Location: [facility] OR;  Service: Open Heart Surgery;;   REMOVAL OF IMPELLA LEFT VENTRICULAR ASSIST DEVICE N/A 02/08/2018   Procedure: REMOVAL OF IMPELLA LEFT VENTRICULAR ASSIST DEVICE;  Surgeon: Dusty Sudie DEL, MD;  Location: [facility] OR;  Service: Open Heart Surgery;  Laterality: N/A;   RIGHT HEART CATH N/A 02/03/2018   Procedure: RIGHT HEART CATH;  Surgeon: Jordan, Peter M, MD;  Location: [facility] INVASIVE CV LAB;  Service: Cardiovascular;  Laterality: N/A;   RIGHT HEART CATH N/A 05/09/2018   Procedure: RIGHT HEART CATH;  Surgeon: Cherrie Toribio SAUNDERS, MD;  Location: [facility] INVASIVE CV LAB;  Service: Cardiovascular;  Laterality: N/A;   TEE WITHOUT CARDIOVERSION N/A 02/08/2018   Procedure: TRANSESOPHAGEAL ECHOCARDIOGRAM (TEE);  Surgeon: Dusty Sudie DEL, MD;  Location: [facility] OR;  Service: Open Heart Surgery;  Laterality: N/A;   TUBAL LIGATION     VIDEO BRONCHOSCOPY WITH ENDOBRONCHIAL ULTRASOUND N/A 03/11/2014   Procedure: VIDEO BRONCHOSCOPY WITH ENDOBRONCHIAL ULTRASOUND;  Surgeon: Elspeth JAYSON Millers, MD;  Location: [facility] OR;  Service: Thoracic;  Laterality: N/A;   Current Outpatient Medications  Medication Sig Dispense Refill   albuterol  (VENTOLIN  HFA) 108 (90 Base) MCG/ACT inhaler Inhale 2 puffs into the lungs every 6 (six) hours as needed for  wheezing or shortness of breath. 8.5 g 1   apixaban  (ELIQUIS ) 2.5 MG TABS tablet Take 1 tablet (2.5 mg total) by mouth 2 (two) times daily. 180 tablet 3   atorvastatin  (LIPITOR ) 80 MG tablet Take 1 tablet (80 mg total) by mouth daily. 90 tablet 3   cyclobenzaprine  (FLEXERIL ) 10 MG tablet Take 1 tablet (10 mg total) by mouth 3 (three) times daily as needed for muscle spasms. 60 tablet 3   dapagliflozin  propanediol (FARXIGA ) 10 MG TABS tablet Take 1 tablet (10 mg total) by mouth daily. 90 tablet 3   Digoxin  62.5 MCG TABS Take 0.0625 mg by mouth daily. 90 tablet 3   Dihydroxyaluminum Sod Carb (ROLAIDS PO) Take 1  tablet by mouth as needed.     Furosemide  (FUROSCIX ) 80 MG/10ML CTKT Inject 80 mg into the skin daily as needed (fluid and edema). Take as directed by the heart failure clinic only 10 each 0   hydrOXYzine  (VISTARIL ) 25 MG capsule TAKE 1 CAPSULE (25 MG TOTAL) BY MOUTH DAILY AS NEEDED. 90 capsule 1   ipratropium-albuterol  (DUONEB) 0.5-2.5 (3) MG/3ML SOLN TAKE 3 MLS BY NEBULIZATION EVERY 4 (FOUR) HOURS AS NEEDED (FOR SHORTNESS OF BREATH). 540 mL 1   ivabradine  (CORLANOR ) 7.5 MG TABS tablet Take 1 tablet (7.5 mg total) by mouth 2 (two) times daily with a meal. 180 tablet 3   megestrol  (MEGACE ) 20 MG tablet Take 1 tablet (20 mg total) by mouth 2 (two) times daily. 60 tablet 1   metolazone  (ZAROXOLYN ) 5 MG tablet Take 1 tablet (5 mg total) by mouth as needed (as instructed by Advanced HF clinic). 10 tablet 3   Multiple Vitamin (MULTIVITAMIN WITH MINERALS) TABS tablet Take 1 tablet by mouth daily. Gummies     nitroGLYCERIN  (NITROSTAT ) 0.4 MG SL tablet Place 1 tablet (0.4 mg total) under the tongue every 5 (five) minutes as needed for chest pain. 90 tablet 0   Nutritional Supplements (ENSURE ORIGINAL) LIQD Take 1 Bottle by mouth 2 (two) times daily as needed (poor appetite).     pantoprazole  (PROTONIX ) 40 MG tablet Take 1 tablet (40 mg total) by mouth daily at 6 (six) AM. 5 tablet 0   potassium chloride  (KLOR-CON ) 20 MEQ packet Take 80 mEq by mouth 2 (two) times daily. TAKE 80 MEQ TOTAL TWICE A DAY EXCEPT ON METOLAZONE  DAYS OF TUESDAYS TAKE (100 MEQ) 3 TIMES PER DAY. 360 packet 2   spironolactone  (ALDACTONE ) 25 MG tablet Take 1 tablet (25 mg total) by mouth daily. 90 tablet 3   torsemide  (DEMADEX ) 100 MG tablet Take 1 tablet (100 mg total) by mouth 2 (two) times daily. 180 tablet 3   No current facility-administered medications for this visit.    Allergies:   Lactose intolerance (gi) and Codeine   Social History:  The patient  reports that she quit smoking about 10 years ago. Her smoking use included  cigarettes. She started smoking about 30 years ago. She has a 20 pack-year smoking history. She has never used smokeless tobacco. She reports current alcohol use of about 5.0 standard drinks of alcohol per week. She reports current drug use. Drug: Marijuana.   Family History:  The patient's family history includes Cancer in her maternal grandmother; Diabetes in her paternal grandmother; Heart attack in her mother; Heart disease in her mother; Hypertension in her maternal grandmother and mother.   ROS:  Please see the history of present illness.   All other systems are personally reviewed and negative.  Wt Readings from Last 3 Encounters:  10/22/24 40.4 kg (89 lb)  09/16/24 39.8 kg (87 lb 12.8 oz)  09/02/24 41.2 kg (90 lb 12.8 oz)   There were no vitals taken for this visit.  Physical Exam General:  *** appearing.  No respiratory difficulty Neck: JVD *** cm.  Cor: Regular rate & rhythm. No murmurs. Lungs: clear Extremities: no edema  Neuro: alert & oriented x 3. Affect pleasant.   Device interrogation (personally reviewed): OptiVol up since 08/2024, no AF or VT, 0.9 hr/day activity, 100% VS ***  Assessment & Plan: 1. End-stage Chronic Systolic Heart Failure - EF has been 20-25% for the last 5 years.  - S/p Medtronic ICD and Barostim - Echo 1/25: EF < 20%, mod to severely reduced RV (in setting of RSV) - Numerous hospitalizations over past 18 months  - NYHA III, functional status confounded by COPD. Volume up on exam and by device (OptiVol significantly elevated). - Use Furoscix  bid + 80 KCL bid x 2 days, hold torsemide  while using Furoscix . *** - After 2 days, resume torsemide  100 mg bid + 80 KCL bid *** - Has metolazone  to use PRN but she does not feel she responds very well to this - Continue digoxin  0.0625 mg daily. Dig level <0.7 08/28/24 - Continue Farxiga  10 mg daily. - Continue spironolactone  25 mg daily. - Continue ivabradine  7.5 mg bid. - Not a candidate for advanced  therapies due to severity of lung disease and frailty.  - Labs today, will need repeat BMET at follow up ***   2. Chronic Hypoxic Respiratory Failure/COPD  - Hx frequent admissions for AE COPD + A/C HFrEF.  - Currently stable   3. CKD IIIb - Creatinine baseline ~ 1.4-1.7.  - Continue Farxiga  10 mg daily - Labs today  4. PAF  - Regular on exam. No episodes on device - Continue Eliquis  2.5 mg bid, reduced dose (weight/creatinine). No bleeding issues  5. CAD - History of CABG x 2 2019 - s/p DES ostial ramus extending to left main (2020) - No angina - No ASA with need for anticoagulation - Continue high-intensity statin  6. H/o SCLC - Completed treatment 2015.  - CT chest (5/24) soft tissue thickening along mediastinum but no discrete mass    7. H/o Renal Infarct 2022 - On Eliquis .  - No bleeding issues.   8. GOC/FTT - She is DNR/DNI - She has declined HH PT/OT  9. DM II - Last A1c was 6.5% - Per PCP  Follow up in 1 week with APP for fluid check. She is high-risk for re-admission  Beckey LITTIE Coe AGACNP-BC  11/11/24

## 2024-11-14 ENCOUNTER — Telehealth (HOSPITAL_COMMUNITY): Payer: Self-pay

## 2024-11-14 NOTE — Telephone Encounter (Signed)
 Called to confirm/remind patient of their appointment at the [facility] on 11/17/24 3:30.   Appointment:   [x] Confirmed  [] Left mess   [] No answer/No voice mail  [] VM Full/unable to leave message  [] Phone not in service  Patient reminded to bring all medications and/or complete list.  Confirmed patient has transportation. Gave directions, instructed to utilize valet parking.

## 2024-11-17 ENCOUNTER — Encounter (HOSPITAL_COMMUNITY): Payer: Self-pay

## 2024-11-17 ENCOUNTER — Ambulatory Visit (HOSPITAL_COMMUNITY): Admission: RE | Admit: 2024-11-17 | Discharge: 2024-11-17 | Attending: Physician Assistant

## 2024-11-17 ENCOUNTER — Other Ambulatory Visit (HOSPITAL_COMMUNITY): Payer: Self-pay

## 2024-11-17 ENCOUNTER — Encounter: Payer: Self-pay | Admitting: Internal Medicine

## 2024-11-17 ENCOUNTER — Telehealth (HOSPITAL_COMMUNITY): Payer: Self-pay | Admitting: Licensed Clinical Social Worker

## 2024-11-17 ENCOUNTER — Telehealth (HOSPITAL_COMMUNITY): Payer: Self-pay

## 2024-11-17 VITALS — BP 96/58 | HR 79 | Ht 59.5 in | Wt 86.4 lb

## 2024-11-17 DIAGNOSIS — N1832 Chronic kidney disease, stage 3b: Secondary | ICD-10-CM

## 2024-11-17 DIAGNOSIS — I5022 Chronic systolic (congestive) heart failure: Secondary | ICD-10-CM

## 2024-11-17 DIAGNOSIS — I2510 Atherosclerotic heart disease of native coronary artery without angina pectoris: Secondary | ICD-10-CM

## 2024-11-17 DIAGNOSIS — I480 Paroxysmal atrial fibrillation: Secondary | ICD-10-CM

## 2024-11-17 LAB — BRAIN NATRIURETIC PEPTIDE: B Natriuretic Peptide: 1898.1 pg/mL — ABNORMAL HIGH (ref 0.0–100.0)

## 2024-11-17 LAB — BASIC METABOLIC PANEL WITH GFR
Anion gap: 8 (ref 5–15)
BUN: 29 mg/dL — ABNORMAL HIGH (ref 8–23)
CO2: 28 mmol/L (ref 22–32)
Calcium: 10.1 mg/dL (ref 8.9–10.3)
Chloride: 102 mmol/L (ref 98–111)
Creatinine, Ser: 1.45 mg/dL — ABNORMAL HIGH (ref 0.44–1.00)
GFR, Estimated: 40 mL/min — ABNORMAL LOW (ref >60)
Glucose, Bld: 115 mg/dL — ABNORMAL HIGH (ref 70–99)
Potassium: 4.1 mmol/L (ref 3.5–5.1)
Sodium: 138 mmol/L (ref 135–145)

## 2024-11-17 MED ORDER — DIGOXIN 62.5 MCG PO TABS: 0.0625 mg | ORAL_TABLET | Freq: Every day | ORAL | 3 refills | Status: AC

## 2024-11-17 NOTE — Progress Notes (Signed)
 ReDS Vest / Clip - 11/17/24 1535       ReDS Vest / Clip   Station Marker A    Ruler Value 27    ReDS Value Range Low volume    ReDS Actual Value 17

## 2024-11-17 NOTE — Patient Instructions (Addendum)
 Good to see you today!   Your provider has order Furoscix  for you. This is an on-body infuser that gives you a dose of Furosemide .   It will be shipped to your home from [facility], they will call you before shipping  Ensure you write down the time you start your infusion so that if there is a problem you will know how long the infusion lasted  Use Furoscix  only AS DIRECTED by our office  Dosing Directions:   Day 1= today use 1 Furoscix  kit    Labs done today, your results will be available in MyChart, we will contact you for abnormal readings.  Your physician recommends that you schedule a follow-up appointment  as scheduled  If you have any questions or concerns before your next appointment please send us  a message through [place] or call our office at [phone].    TO LEAVE A MESSAGE FOR THE NURSE SELECT OPTION 2, PLEASE LEAVE A MESSAGE INCLUDING: YOUR NAME DATE OF BIRTH CALL BACK NUMBER REASON FOR CALL**this is important as we prioritize the call backs  YOU WILL RECEIVE A CALL BACK THE SAME DAY AS LONG AS YOU CALL BEFORE 4:00 PM At the [facility], you and your health needs are our priority. As part of our continuing mission to provide you with exceptional heart care, we have created designated Provider Care Teams. These Care Teams include your primary Cardiologist (physician) and Advanced Practice Providers (APPs- Physician Assistants and Nurse Practitioners) who all work together to provide you with the care you need, when you need it.   You may see any of the following providers on your designated Care Team at your next follow up: Dr Toribio Fuel Dr Ezra Shuck Dr. Morene Brownie Greig Mosses, NP Caffie Shed, [place] [facility] [place], [place] Beckey Coe, NP Jordan Lee, NP Ellouise Class, NP Tinnie Redman, PharmD Jaun Bash, PharmD   Please be sure to bring in all your medications bottles to every appointment.     Thank you for choosing [facility]

## 2024-11-17 NOTE — Telephone Encounter (Signed)
 Advanced Heart Failure Patient Advocate Encounter  Prior authorization for Furoscix  has been submitted and approved. Test billing returns $0 for up to 90 day supply.  Key: [id number] Effective: 10/18/2024 to 12/17/2024  Rachel DEL, CPhT Rx Patient Advocate Phone: [phone]

## 2024-11-17 NOTE — Progress Notes (Signed)
 [facility]   PCP:  Tanda Bleacher, MD  Oncology: Dr. Sherrod  HF Cardiologist: Dr Cherrie  HPI: Brittany [name]  is a 63 y.o. 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 Underwent emergent cath 02/03/18 showed LAD 100% stenosed, LCx 95% stenosed. Taken for emergent CABG 02/03/18. Required impella post op. Hospital course complicated by cardiogenic shock, HCAP, A fib, respiratory failure, and swallowing issues. She was discharged to SNF. Discharge weight 103 pounds.    In 2019 had multiple hospitalizations for HF and pleural effusion. Underwent pleurodesis at [facility].   Admitted 3/20 with NSTEMI and HF. Received DES to ostial ramus into distal left main based on cath below and underwent diuresis. Meds adjusted as tolerated. Echo with EF 25-30%.    Admitted twice in 2023 with low output requiring milrinone . Barostim placed.   Multiple CHF and AECOPD admission in May, August, October of 2024 and Janurary 2025. Echo 1/25 EF  < 20%, G2DD, RV moderately to severely reduced. She remained DNR.  Admitted 3/25 for CHF and AECOPD. Readmitted 4/25 for CHF/COPD. Was hypotensive. Moved to ICU. Required DBA and IV diuresis. Refused [facility] services.   Admitted 8/25 with a/c hypoxic respiratory failure and acute on chronic CHF. She required inotrope support with DBA which was wean off prior to discharge.   Remote ICD interrogation 08/18 indicated fluid accumulation since 08/12. We attempted to arrange for Furoscix  08/19 but she was unable to get to clinic to pick up the kit. She did use 5 mg metolazone .  Admitted 9/25 with a/c HF. Diuresed with IV lasix  and transiently required BiPap. GDMT titrated  but limited by AKI. She was discharged home, weight 86 lbs.  Volume overloaded at follow-up a few weeks ago. Instructed to use furoscix  BID X 2 days. Restarted Farxiga . Here today for close CHF follow-up.   Here today for CHF follow-up. Last used furoscix  a week ago, only has one kit left. Some of the ones in her last shipment were defective. Gets winded easily with exertion but does not feel worse than baseline. Does not feel volume up today. Has been slowly losing weight, down to 84 lb. Doesn't have much appetite. No lower extremity edema. Taking meds as prescribed, but did run out of digoxin  2 days ago.   Past Medical History:  Diagnosis Date   Acute respiratory failure (HCC) 05/05/2018   Acute systolic congestive heart failure (HCC) 02/03/2018   AICD (automatic cardioverter/defibrillator) present    Allergy    Anxiety    Asthma    DM2 (diabetes mellitus, type 2) (HCC) 10/20/2020   Hypertension    PAF (paroxysmal atrial fibrillation) (HCC)    Presence of permanent cardiac pacemaker    Prophylactic measure 08/03/14-08/19/14   Prophyl. 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   LIMA to LAD, SVG to D1, SVG to OM1, EVH via right thigh with implantation of Impella LD LVAD via direct aortic approach   Small cell lung cancer (HCC) 03/16/2014   Past Surgical History:  Procedure Laterality Date   BRONCHIAL BRUSHINGS  10/25/2020   Procedure: BRONCHIAL BRUSHINGS;  Surgeon: Shelah Lamar RAMAN, MD;  Location: [facility] ENDOSCOPY;  Service: Pulmonary;;  BRONCHIAL NEEDLE ASPIRATION BIOPSY  10/25/2020   Procedure: BRONCHIAL NEEDLE ASPIRATION BIOPSIES;  Surgeon: Shelah Lamar RAMAN, MD;  Location: [facility] ENDOSCOPY;  Service: Pulmonary;;   CARDIAC DEFIBRILLATOR PLACEMENT  08/15/2018   MDT Visia AF MRI VR ICD implanted by Dr Zulma for primary prevention of sudden   CESAREAN SECTION     CORONARY ARTERY BYPASS GRAFT N/A 02/03/2018   Procedure: CORONARY ARTERY BYPASS GRAFTING (CABG);  Surgeon: Dusty Sudie DEL, MD;  Location: [facility] OR;  Service: Open Heart Surgery;  Laterality: N/A;  Time 3 using left internal mammary artery and endoscopically harvested right saphenous vein   CORONARY BALLOON ANGIOPLASTY N/A 02/03/2018   Procedure: CORONARY BALLOON ANGIOPLASTY;  Surgeon: Jordan, Peter M, MD;  Location: [facility] INVASIVE CV LAB;  Service: Cardiovascular;  Laterality: N/A;   CORONARY STENT INTERVENTION N/A 02/12/2019   Procedure: CORONARY STENT INTERVENTION;  Surgeon: Darron Deatrice LABOR, MD;  Location: [facility] INVASIVE CV LAB;  Service: Cardiovascular;  Laterality: N/A;   CORONARY/GRAFT ACUTE MI REVASCULARIZATION N/A 02/03/2018   Procedure: Coronary/Graft Acute MI Revascularization;  Surgeon: Jordan, Peter M, MD;  Location: [facility] INVASIVE CV LAB;  Service: Cardiovascular;  Laterality: N/A;   ENDOBRONCHIAL ULTRASOUND N/A 10/25/2020   Procedure: ENDOBRONCHIAL ULTRASOUND;  Surgeon: Shelah Lamar RAMAN, MD;  Location: [facility] ENDOSCOPY;  Service: Pulmonary;  Laterality: N/A;   FLEXIBLE BRONCHOSCOPY  10/25/2020   Procedure: FLEXIBLE BRONCHOSCOPY;  Surgeon: Shelah Lamar RAMAN, MD;  Location: [facility] ENDOSCOPY;  Service: Pulmonary;;   IABP INSERTION N/A 02/03/2018   Procedure: IABP Insertion;  Surgeon: Jordan, Peter M, MD;  Location: [facility] INVASIVE CV LAB;  Service: Cardiovascular;  Laterality: N/A;   INTRAOPERATIVE TRANSESOPHAGEAL ECHOCARDIOGRAM N/A 02/03/2018   Procedure: INTRAOPERATIVE TRANSESOPHAGEAL ECHOCARDIOGRAM;  Surgeon: Dusty Sudie DEL, MD;  Location: [facility] OR;  Service: Open Heart Surgery;  Laterality: N/A;   LEFT HEART CATH AND CORONARY ANGIOGRAPHY N/A 02/03/2018   Procedure: LEFT HEART CATH AND CORONARY ANGIOGRAPHY;  Surgeon: Jordan, Peter M, MD;  Location: [facility] INVASIVE CV LAB;  Service: Cardiovascular;  Laterality: N/A;   LEFT HEART CATH AND CORS/GRAFTS ANGIOGRAPHY N/A 02/12/2019   Procedure: LEFT HEART CATH AND CORS/GRAFTS ANGIOGRAPHY;  Surgeon: Darron Deatrice LABOR, MD;  Location: [facility] INVASIVE CV LAB;  Service: Cardiovascular;  Laterality:  N/A;   MEDIASTINOSCOPY N/A 03/11/2014   Procedure: MEDIASTINOSCOPY;  Surgeon: Elspeth JAYSON Millers, MD;  Location: [facility] OR;  Service: Thoracic;  Laterality: N/A;   PLACEMENT OF IMPELLA LEFT VENTRICULAR ASSIST DEVICE  02/03/2018   Procedure: PLACEMENT OF IMPELLA LEFT VENTRICULAR ASSIST DEVICE LD;  Surgeon: Dusty Sudie DEL, MD;  Location: [facility] OR;  Service: Open Heart Surgery;;   REMOVAL OF IMPELLA LEFT VENTRICULAR ASSIST DEVICE N/A 02/08/2018   Procedure: REMOVAL OF IMPELLA LEFT VENTRICULAR ASSIST DEVICE;  Surgeon: Dusty Sudie DEL, MD;  Location: [facility] OR;  Service: Open Heart Surgery;  Laterality: N/A;   RIGHT HEART CATH N/A 02/03/2018   Procedure: RIGHT HEART CATH;  Surgeon: Jordan, Peter M, MD;  Location: [facility] INVASIVE CV LAB;  Service: Cardiovascular;  Laterality: N/A;   RIGHT HEART CATH N/A 05/09/2018   Procedure: RIGHT HEART CATH;  Surgeon: Cherrie Toribio SAUNDERS, MD;  Location: [facility] INVASIVE CV LAB;  Service: Cardiovascular;  Laterality: N/A;   TEE WITHOUT CARDIOVERSION N/A 02/08/2018   Procedure: TRANSESOPHAGEAL ECHOCARDIOGRAM (TEE);  Surgeon: Dusty Sudie DEL, MD;  Location: [facility] OR;  Service: Open Heart Surgery;  Laterality: N/A;   TUBAL LIGATION     VIDEO BRONCHOSCOPY WITH ENDOBRONCHIAL ULTRASOUND N/A 03/11/2014   Procedure: VIDEO BRONCHOSCOPY  WITH ENDOBRONCHIAL ULTRASOUND;  Surgeon: Elspeth JAYSON Millers, MD;  Location: [facility] OR;  Service: Thoracic;  Laterality: N/A;   Current Outpatient Medications  Medication Sig Dispense Refill   albuterol  (VENTOLIN  HFA) 108 (90 Base) MCG/ACT inhaler Inhale 2 puffs into the lungs every 6 (six) hours as needed for wheezing or shortness of breath. 8.5 g 1   atorvastatin  (LIPITOR ) 80 MG tablet Take 1 tablet (80 mg total) by mouth daily. 90 tablet 3   cyclobenzaprine  (FLEXERIL ) 10 MG tablet Take 1 tablet (10 mg total) by mouth 3 (three) times daily as needed for muscle spasms. 60 tablet 3   dapagliflozin  propanediol (FARXIGA ) 10 MG TABS tablet Take 1 tablet (10 mg total) by mouth  daily. 90 tablet 3   Dihydroxyaluminum Sod Carb (ROLAIDS PO) Take 1 tablet by mouth as needed.     ELIQUIS  2.5 MG TABS tablet TAKE 1 TABLET BY MOUTH TWICE A DAY 180 tablet 1   Furosemide  (FUROSCIX ) 80 MG/10ML CTKT Inject 80 mg into the skin daily as needed (fluid and edema). Take as directed by the heart failure clinic only 10 each 0   hydrOXYzine  (VISTARIL ) 25 MG capsule TAKE 1 CAPSULE (25 MG TOTAL) BY MOUTH DAILY AS NEEDED. 90 capsule 1   ipratropium-albuterol  (DUONEB) 0.5-2.5 (3) MG/3ML SOLN TAKE 3 MLS BY NEBULIZATION EVERY 4 (FOUR) HOURS AS NEEDED (FOR SHORTNESS OF BREATH). 540 mL 1   ivabradine  (CORLANOR ) 7.5 MG TABS tablet Take 1 tablet (7.5 mg total) by mouth 2 (two) times daily with a meal. 180 tablet 3   megestrol  (MEGACE ) 20 MG tablet Take 1 tablet (20 mg total) by mouth 2 (two) times daily. 60 tablet 1   metolazone  (ZAROXOLYN ) 5 MG tablet Take 1 tablet (5 mg total) by mouth as needed (as instructed by Advanced HF clinic). 10 tablet 3   Multiple Vitamin (MULTIVITAMIN WITH MINERALS) TABS tablet Take 1 tablet by mouth daily. Gummies     nitroGLYCERIN  (NITROSTAT ) 0.4 MG SL tablet Place 1 tablet (0.4 mg total) under the tongue every 5 (five) minutes as needed for chest pain. 90 tablet 0   Nutritional Supplements (ENSURE ORIGINAL) LIQD Take 1 Bottle by mouth 2 (two) times daily as needed (poor appetite).     pantoprazole  (PROTONIX ) 40 MG tablet Take 1 tablet (40 mg total) by mouth daily at 6 (six) AM. 5 tablet 0   potassium chloride  (KLOR-CON ) 20 MEQ packet Take 80 mEq by mouth 2 (two) times daily. TAKE 80 MEQ TOTAL TWICE A DAY EXCEPT ON METOLAZONE  DAYS OF TUESDAYS TAKE (100 MEQ) 3 TIMES PER DAY. 360 packet 2   spironolactone  (ALDACTONE ) 25 MG tablet Take 1 tablet (25 mg total) by mouth daily. 90 tablet 3   torsemide  (DEMADEX ) 100 MG tablet Take 1 tablet (100 mg total) by mouth 2 (two) times daily. 180 tablet 3   Digoxin  62.5 MCG TABS Take 0.0625 mg by mouth daily. 90 tablet 3   No current  facility-administered medications for this encounter.    Allergies:   Lactose intolerance (gi) and Codeine   Social History:  The patient  reports that she quit smoking about 10 years ago. Her smoking use included cigarettes. She started smoking about 30 years ago. She has a 20 pack-year smoking history. She has never used smokeless tobacco. She reports current alcohol use of about 5.0 standard drinks of alcohol per week. She reports current drug use. Drug: Marijuana.   Family History:  The patient's family history includes Cancer in her maternal  grandmother; Diabetes in her paternal grandmother; Heart attack in her mother; Heart disease in her mother; Hypertension in her maternal grandmother and mother.   ROS:  Please see the history of present illness.   All other systems are personally reviewed and negative.    Wt Readings from Last 3 Encounters:  11/17/24 39.2 kg (86 lb 6.4 oz)  10/22/24 40.4 kg (89 lb)  09/16/24 39.8 kg (87 lb 12.8 oz)   BP (!) 96/58   Pulse 79   Ht 4' 11.5 (1.511 m)   Wt 39.2 kg (86 lb 6.4 oz)   SpO2 96%   BMI 17.16 kg/m   Physical Exam General:  Chronically ill appearing Cor: JVP 9-10. Regular rate & rhythm. No murmurs. Lungs: + crackles in bases Abdomen: soft, nontender, nondistended. Extremities: no edema Neuro: alert & orientedx3. Affect pleasant   Device interrogation (personally reviewed): OptiVol persistently > 200 X 2 months, thoracic impedance below threshold, no VT/VF  Assessment & Plan: 1. End-stage Chronic Systolic Heart Failure - EF has been 20-25% for the last 5 years.  - S/p Medtronic ICD and Barostim - Echo 1/25: EF < 20%, mod to severely reduced RV (in setting of RSV) - Numerous hospitalizations over past 18 months  - NYHA III, functional status confounded by COPD. Volume mildly up on exam. ReDS only 17% in setting of COPD (may be falsely low). Suspect OptiVol falsely high. Message sent to device clinic to reset counts. - Will get  refills on furoscix  kits. Use one kit tomorrow with 100 mg Torsemide  in afternoon, then continue 100 mg Torsemide  BID. Continue 80 KCL BID. - Has metolazone  to use PRN but she does not feel she responds very well to this - Refill digoxin  - Continue Farxiga  10 mg daily. - Continue spironolactone  25 mg daily. - Continue ivabradine  7.5 mg bid. - Not a candidate for advanced therapies due to severity of lung disease and frailty.  - Labs today   2. Chronic Hypoxic Respiratory Failure/COPD  - Hx frequent admissions for AE COPD + A/C HFrEF.  - Currently stable   3. CKD IIIb - Creatinine baseline ~ 1.4-1.7.  - Continue Farxiga  10 mg daily - Check labs  4. PAF  - Regular on exam. No episodes on device - Continue Eliquis  2.5 mg bid, reduced dose (weight/creatinine). No bleeding issues  5. CAD - History of CABG x 2 2019 - s/p DES ostial ramus extending to left main (2020) - No angina - No ASA with need for anticoagulation - Continue high-intensity statin  6. H/o SCLC - Completed treatment 2015.  - CT chest (5/24) soft tissue thickening along mediastinum but no discrete mass    7. H/o Renal Infarct 2022 - On Eliquis .  - No bleeding issues.   8. GOC/FTT - She is DNR/DNI - She has declined HH PT/OT  9. DM II - Last A1c was 6.5% - Per PCP  Follow up 6 weeks with APP  [facility], Kiren Mcisaac N PA-C 11/17/24

## 2024-11-17 NOTE — Telephone Encounter (Signed)
 H&V Care Navigation CSW Progress Note  Clinical Social Worker received call from pt requesting help with transport to appt- has had to cancel multiple times due to transportation.  CSW arranged taxi to bring to app today and will plan to meet with her during visit to discuss alternative transportation options.  Will continue to follow and assist as needed  Brittany [name] Leech, LCSW Clinical Social Worker Advanced [facility] Failure Clinic Desk#: [phone] Cell#: [phone]

## 2024-11-17 NOTE — Progress Notes (Unsigned)
 Specialty Pharmacy Refill Coordination Note  Brittany [name]  is a 63 y.o. female contacted today regarding refills of specialty medication(s) Furosemide  (Furoscix )   Patient requested Delivery   Delivery date: 11/19/24   Verified address: [facility] [address] [place] [address]   Medication will be filled on: 11/18/24

## 2024-11-18 ENCOUNTER — Ambulatory Visit (HOSPITAL_COMMUNITY): Payer: Self-pay | Admitting: Physician Assistant

## 2024-11-18 ENCOUNTER — Other Ambulatory Visit: Payer: Self-pay

## 2024-11-19 ENCOUNTER — Other Ambulatory Visit (HOSPITAL_COMMUNITY): Payer: Self-pay

## 2024-11-24 ENCOUNTER — Ambulatory Visit: Attending: Cardiology

## 2024-11-24 DIAGNOSIS — I5022 Chronic systolic (congestive) heart failure: Secondary | ICD-10-CM

## 2024-11-24 DIAGNOSIS — Z95810 Presence of automatic (implantable) cardiac defibrillator: Secondary | ICD-10-CM

## 2024-11-26 ENCOUNTER — Telehealth: Payer: Self-pay

## 2024-11-26 NOTE — Telephone Encounter (Signed)
ICM Call to patient.

## 2024-11-26 NOTE — Progress Notes (Cosign Needed)
 EPIC Encounter for ICM Monitoring  Patient Name: Brittany [name]  is a 63 y.o. female Date: 11/26/2024 Primary Care Physican: Tanda Bleacher, MD Primary Cardiologist: Bensimhon Electrophysiologist:  Kennyth 01/03/2024 Weight: 93-94 lbs  01/22/2024 Weight: 91 lbs (lowest weight 84 lbs) 04/18/2024 Office Weight:  99 lbs 05/27/2024 Weight: 87 lbs 07/14/2024 Weight: 89 lbs 10/06/2024 Weight: 89.10 lbs 10/28/2024 Weight: 87 lbs   Spoke with patient and heart failure questions reviewed.  Transmission results reviewed.  Pt asymptomatic for fluid accumulation.  She does not feel like she has any fluid at this time.  Explained [name], PA at the HF clinic has asked for Optivol to be reset.  ReDS showed 17% at 11/17/2024 HF clinic office visit even though Optivol was showing fluid.     Since 11/10/2024 ICM Remote Transmission: Optivol Thoracic impedance suggesting ongoing possible fluid accumulation and uses Furoscix  as prescribed.   Prescribed: Torsemide  100 mg take 1 tablet (s) (100 mg total) by mouth two times a day.  Potassium 20 mEq packet take 80 mEq total by mouth twice day with Furoscix  and extra 40 mEq when using Metolazone .   Spironolactone  25 mg take 1 tablet (25 mg total) by mouth daily Furosemide  (Furoscix ) 80 mg/10 ml inject into skin daily as needed (fluid and edema). Take as directed by HF clinic.  Taking as needed for fluid sx   Labs: 11/17/2024 Creatinine 1.45, BUN 29, Potassium 4.1, Sodium 138, GFR 40,  10/22/2024 Creatinine 1.49, BUN 24, Potassium 5.1, Sodium 138, GFR 39, BNP 1,589.7 09/16/2024 Creatinine 1.47, BUN 23, Potassium 3.9, Sodium 139, GFR 40, BNP 1,596.2 08/28/2024 Creatinine 1.38, BUN 43, Potassium 3.9, Sodium 135, GFR 43, BNP 2,271.0 08/21/2024 Creatinine 2.25, BUN 68, Potassium 4.1, Sodium 133, GFR 24  08/20/2024 Creatinine 1.82, BUN 48, Potassium 4.0, Sodium 136, GFR 31  08/19/2024 Creatinine 1.62, BUN 32, Potassium 4.2, Sodium 137, GFR 35  08/18/2024  Creatinine 1.28, BUN 21, Potassium 3.4, Sodium 140, GFR 47 07/30/2024 Creatinine 1.34, BUN 29, Potassium 3.1, Sodium 140, GFR 45, BNP 1,826.2 A complete set of results can be found in Results Review.   Recommendations:  Pt will call the HF clinic to obtain Furoscix  in case needed over the holidays.  Will schedule EP office visit after the holidays since she is overdue for 6 month visit (Recall 07/26/2024) and can reset Optivol at that time.  Advised patient will rely more on fluid sx then Optivol readings at this time until it is reset.        Follow-up plan: ICM clinic phone appointment on 12/02/2024 to recheck fluid levels.   91 day device clinic remote transmission 12/29/2024.     EP/Cardiology Office Visits:  12/29/2024 with HF Clinic.  Recall 07/26/2024 with [name], PA   Copy of ICM check sent to Dr. Kennyth.      Remote monitoring is medically necessary for Heart Failure Management.    Daily Thoracic Impedance ICM trend: 08/25/2024 through 11/24/2024.    12-14 Month Thoracic Impedance ICM trend:     [name], RN 11/26/2024 2:08 PM

## 2024-12-02 ENCOUNTER — Ambulatory Visit

## 2024-12-02 DIAGNOSIS — Z95810 Presence of automatic (implantable) cardiac defibrillator: Secondary | ICD-10-CM

## 2024-12-02 DIAGNOSIS — I5022 Chronic systolic (congestive) heart failure: Secondary | ICD-10-CM

## 2024-12-02 NOTE — Progress Notes (Signed)
 EPIC Encounter for ICM Monitoring  Patient Name: Brittany [name]  is a 63 y.o. female Date: 12/02/2024 Primary Care Physican: Tanda Bleacher, MD Primary Cardiologist: Bensimhon Electrophysiologist:  Kennyth 01/03/2024 Weight: 93-94 lbs  01/22/2024 Weight: 91 lbs (lowest weight 84 lbs) 04/18/2024 Office Weight:  99 lbs 05/27/2024 Weight: 87 lbs 07/14/2024 Weight: 89 lbs 10/06/2024 Weight: 89.10 lbs 10/28/2024 Weight: 87 lbs   Spoke with patient and heart failure questions reviewed.  Transmission results reviewed.  Pt reports she did not sleep well last night due to SOB.  She tried using her oxygen but still feeling SOB today as well as she has no appetite.  She said she almost called 911 last night due to feeling so poorly.        Since 11/10/2024 ICM Remote Transmission: Optivol Thoracic impedance suggesting ongoing possible fluid accumulation and uses Furoscix  as prescribed.  [name], PA at the HF clinic requested Optivol to be reset since ReDS showed 17% at 11/17/2024 HF clinic office which does not correlate with impedance.  Pt will make an yearly appt with EP and have Optivol reset after the holidays.     Prescribed: Torsemide  100 mg take 1 tablet (s) (100 mg total) by mouth two times a day.  Potassium 20 mEq packet take 80 mEq total by mouth twice day with Furoscix  and extra 40 mEq when using Metolazone .   Spironolactone  25 mg take 1 tablet (25 mg total) by mouth daily Furosemide  (Furoscix ) 80 mg/10 ml inject into skin daily as needed (fluid and edema). Take as directed by HF clinic.     Labs: 11/17/2024 Creatinine 1.45, BUN 29, Potassium 4.1, Sodium 138, GFR 40,  10/22/2024 Creatinine 1.49, BUN 24, Potassium 5.1, Sodium 138, GFR 39, BNP 1,589.7 09/16/2024 Creatinine 1.47, BUN 23, Potassium 3.9, Sodium 139, GFR 40, BNP 1,596.2 08/28/2024 Creatinine 1.38, BUN 43, Potassium 3.9, Sodium 135, GFR 43, BNP 2,271.0 08/21/2024 Creatinine 2.25, BUN 68, Potassium 4.1, Sodium 133, GFR 24   08/20/2024 Creatinine 1.82, BUN 48, Potassium 4.0, Sodium 136, GFR 31  08/19/2024 Creatinine 1.62, BUN 32, Potassium 4.2, Sodium 137, GFR 35  08/18/2024 Creatinine 1.28, BUN 21, Potassium 3.4, Sodium 140, GFR 47 07/30/2024 Creatinine 1.34, BUN 29, Potassium 3.1, Sodium 140, GFR 45, BNP 1,826.2 A complete set of results can be found in Results Review.   Recommendations: She takes Furoscix  as discussed at HF clinic office visits:  11/30/2024 Furoscix  dose 12/01/2024 Furoscix  (AM dose) and Torsemide  100 mg PM 12/02/2024 Furoscix  today (2:30 pm) and evening dose of Torsemide  100 mg (waits 6 hours after Furoscix ).    She will call 911 if syxmptoms persist or worsen.     Follow-up plan: ICM clinic phone appointment on 12/15/2024 to recheck fluid levels.   91 day device clinic remote transmission 12/29/2024.     EP/Cardiology Office Visits:  12/29/2024 with HF Clinic.  Recall 07/26/2024 with [name], PA   Copy of ICM check sent to Dr. Kennyth.    Remote monitoring is medically necessary for Heart Failure Management.    Daily Thoracic Impedance ICM trend: 09/02/2024 through 12/02/2024.    12-14 Month Thoracic Impedance ICM trend:     [name], RN 12/02/2024 1:31 PM

## 2024-12-02 NOTE — Progress Notes (Signed)
" °  Received: Today Marcine Caffie HERO, PA-C  Courteny Egler, Mitzie RAMAN, RN Reviewed. Agree w Furoscix /plan outlined in report. Thank you. "

## 2024-12-10 ENCOUNTER — Telehealth: Payer: Self-pay

## 2024-12-10 NOTE — Telephone Encounter (Signed)
 ICM follow up call to patient to check on fluid symptoms.  She continues to manage fluid symptoms by taking Furoscix  as directed by HF clinic.  She currently does not feel like she has any fluid accumulation.  Next ICM remote transmission scheduled for 12/15/2024.  She will use 911 if fluid symptoms develop and become urgent.

## 2024-12-15 ENCOUNTER — Telehealth: Payer: Self-pay | Admitting: Family Medicine

## 2024-12-15 ENCOUNTER — Ambulatory Visit

## 2024-12-15 DIAGNOSIS — Z95810 Presence of automatic (implantable) cardiac defibrillator: Secondary | ICD-10-CM

## 2024-12-15 DIAGNOSIS — I5022 Chronic systolic (congestive) heart failure: Secondary | ICD-10-CM

## 2024-12-15 NOTE — Telephone Encounter (Signed)
 Copied from CRM [id number]. Topic: General - Other >> Dec 15, 2024 11:29 AM Adelita E wrote: Reason for CRM: Patient would like PCP or nurse to reach out to [facility] at [phone] to relay that her health condition is chronic. Patient is needing this completed so her benefits kick in.

## 2024-12-16 NOTE — Progress Notes (Signed)
 EPIC Encounter for ICM Monitoring  Patient Name: Brittany [name]  is a 64 y.o. female Date: 12/16/2024 Primary Care Physican: Tanda Bleacher, MD Primary Cardiologist: Bensimhon Electrophysiologist:  Kennyth 01/03/2024 Weight: 93-94 lbs  01/22/2024 Weight: 91 lbs (lowest weight 84 lbs) 04/18/2024 Office Weight:  99 lbs 05/27/2024 Weight: 87 lbs 07/14/2024 Weight: 89 lbs 10/06/2024 Weight: 89.10 lbs 10/28/2024 Weight: 87 lbs   Spoke with patient and heart failure questions reviewed.  Transmission results reviewed.  Pt reports she is doing well and has not had not used Furoscix  since 12/02/2024.  She does have some sinus congestion.   Since 12/02/2024 ICM Remote Transmission: Optivol Thoracic impedance returned to normal on 12/16/2023.    [name], PA at the HF clinic requested Optivol to be reset since ReDS showed 17% at 11/17/2024 HF clinic office which does not correlate with impedance.  Pt will make an yearly appt with EP and have Optivol reset after the holidays.     Prescribed: Torsemide  100 mg take 1 tablet (s) (100 mg total) by mouth two times a day.  Potassium 20 mEq packet take 80 mEq total by mouth twice day with Furoscix  and extra 40 mEq when using Metolazone .   Spironolactone  25 mg take 1 tablet (25 mg total) by mouth daily Furosemide  (Furoscix ) 80 mg/10 ml inject into skin daily as needed (fluid and edema). Take as directed by HF clinic.     Labs: 11/17/2024 Creatinine 1.45, BUN 29, Potassium 4.1, Sodium 138, GFR 40,  10/22/2024 Creatinine 1.49, BUN 24, Potassium 5.1, Sodium 138, GFR 39, BNP 1,589.7 09/16/2024 Creatinine 1.47, BUN 23, Potassium 3.9, Sodium 139, GFR 40, BNP 1,596.2 08/28/2024 Creatinine 1.38, BUN 43, Potassium 3.9, Sodium 135, GFR 43, BNP 2,271.0 08/21/2024 Creatinine 2.25, BUN 68, Potassium 4.1, Sodium 133, GFR 24  08/20/2024 Creatinine 1.82, BUN 48, Potassium 4.0, Sodium 136, GFR 31  08/19/2024 Creatinine 1.62, BUN 32, Potassium 4.2, Sodium 137, GFR 35   08/18/2024 Creatinine 1.28, BUN 21, Potassium 3.4, Sodium 140, GFR 47 07/30/2024 Creatinine 1.34, BUN 29, Potassium 3.1, Sodium 140, GFR 45, BNP 1,826.2 A complete set of results can be found in Results Review.   Recommendations:  No changes and encouraged to call if experiencing any fluid symptoms.   Follow-up plan: ICM clinic phone appointment on 12/24/2024.   91 day device clinic remote transmission 12/29/2024.     EP/Cardiology Office Visits:  12/29/2024 with HF Clinic.  Recall 07/26/2024 with [name], PA   Copy of ICM check sent to Dr. Kennyth.    Remote monitoring is medically necessary for Heart Failure Management.    Daily Thoracic Impedance ICM trend: 09/15/2024 through 12/15/2024.    12-14 Month Thoracic Impedance ICM trend:     [name], RN 12/16/2024 4:08 PM

## 2024-12-16 NOTE — Telephone Encounter (Signed)
 Done

## 2024-12-24 ENCOUNTER — Ambulatory Visit: Attending: Cardiology

## 2024-12-24 ENCOUNTER — Telehealth: Payer: Self-pay

## 2024-12-24 DIAGNOSIS — Z95810 Presence of automatic (implantable) cardiac defibrillator: Secondary | ICD-10-CM

## 2024-12-24 DIAGNOSIS — I5022 Chronic systolic (congestive) heart failure: Secondary | ICD-10-CM

## 2024-12-24 NOTE — Progress Notes (Signed)
 EPIC Encounter for ICM Monitoring  Patient Name: Brittany [name]  is a 64 y.o. female Date: 12/24/2024 Primary Care Physican: Tanda Bleacher, MD Primary Cardiologist: Bensimhon Electrophysiologist:  Kennyth 01/03/2024 Weight: 93-94 lbs  01/22/2024 Weight: 91 lbs (lowest weight 84 lbs) 04/18/2024 Office Weight:  99 lbs 05/27/2024 Weight: 87 lbs 07/14/2024 Weight: 89 lbs 10/06/2024 Weight: 89.10 lbs 10/28/2024 Weight: 87 lbs   Spoke with patient and heart failure questions reviewed.  Transmission results reviewed.  Pt reports using Furoscix  on 12/23/2024 due to unable to sleep at night due to breathing difficulty and feeling better today.     Since 12/15/2024 ICM Remote Transmission: Optivol Thoracic impedance suggesting possible fluid returned 12/16/2024.     [name], PA at the HF clinic requested Optivol to be reset since ReDS showed 17% at 11/17/2024 HF clinic office which does not correlate with impedance.       Prescribed: Torsemide  100 mg take 1 tablet (s) (100 mg total) by mouth two times a day.  Potassium 20 mEq packet take 80 mEq total by mouth twice day with Furoscix  and extra 40 mEq when using Metolazone .   Spironolactone  25 mg take 1 tablet (25 mg total) by mouth daily Furosemide  (Furoscix ) 80 mg/10 ml inject into skin daily as needed (fluid and edema). Take as directed by HF clinic.     Labs: 11/17/2024 Creatinine 1.45, BUN 29, Potassium 4.1, Sodium 138, GFR 40,  10/22/2024 Creatinine 1.49, BUN 24, Potassium 5.1, Sodium 138, GFR 39, BNP 1,589.7 09/16/2024 Creatinine 1.47, BUN 23, Potassium 3.9, Sodium 139, GFR 40, BNP 1,596.2 08/28/2024 Creatinine 1.38, BUN 43, Potassium 3.9, Sodium 135, GFR 43, BNP 2,271.0 08/21/2024 Creatinine 2.25, BUN 68, Potassium 4.1, Sodium 133, GFR 24  08/20/2024 Creatinine 1.82, BUN 48, Potassium 4.0, Sodium 136, GFR 31  08/19/2024 Creatinine 1.62, BUN 32, Potassium 4.2, Sodium 137, GFR 35  08/18/2024 Creatinine 1.28, BUN 21, Potassium 3.4, Sodium  140, GFR 47 07/30/2024 Creatinine 1.34, BUN 29, Potassium 3.1, Sodium 140, GFR 45, BNP 1,826.2 A complete set of results can be found in Results Review.   Recommendations: Advised to call the office or use ED if symptoms worsen.  She continues to use Furoscix  to manage fluid sx.   Explained HF clinic requested to reset the fluid level part on her device and will try to arrange for [employer] rep to do that at 12/29/2024 HF clinic office visit.    Follow-up plan: ICM clinic phone appointment on 01/05/2025 to recheck fluid levels.   91 day device clinic remote transmission 12/29/2024.     EP/Cardiology Office Visits:  12/29/2024 with HF Clinic.   Recall 07/26/2024 with [name], PA   Copy of ICM check sent to Dr. Kennyth.    Remote monitoring is medically necessary for Heart Failure Management.    Daily Thoracic Impedance ICM trend: 09/24/2024 through 12/24/2024.    12-14 Month Thoracic Impedance ICM trend:     [name], RN 12/24/2024 7:29 AM

## 2024-12-24 NOTE — Telephone Encounter (Signed)
 Attempted call to Brittany [name] Representative and left message requesting a representative to reset Optivol at HF clinic OV on 12/29/2024 at 1:30 PM.  Left call back number.

## 2024-12-26 ENCOUNTER — Other Ambulatory Visit: Payer: Self-pay

## 2024-12-26 ENCOUNTER — Telehealth (HOSPITAL_COMMUNITY): Payer: Self-pay

## 2024-12-26 ENCOUNTER — Inpatient Hospital Stay (HOSPITAL_COMMUNITY)
Admission: EM | Admit: 2024-12-26 | Discharge: 2024-12-31 | DRG: 280 | Disposition: A | Discharge status: Home / Self Care | Attending: Internal Medicine | Admitting: Internal Medicine

## 2024-12-26 ENCOUNTER — Encounter (HOSPITAL_COMMUNITY): Payer: Self-pay | Admitting: Internal Medicine

## 2024-12-26 ENCOUNTER — Emergency Department (HOSPITAL_COMMUNITY)

## 2024-12-26 DIAGNOSIS — E872 Acidosis, unspecified: Secondary | ICD-10-CM | POA: Diagnosis present

## 2024-12-26 DIAGNOSIS — Z5320 Procedure and treatment not carried out because of patient's decision for unspecified reasons: Secondary | ICD-10-CM | POA: Diagnosis present

## 2024-12-26 DIAGNOSIS — I5022 Chronic systolic (congestive) heart failure: Secondary | ICD-10-CM

## 2024-12-26 DIAGNOSIS — Z955 Presence of coronary angioplasty implant and graft: Secondary | ICD-10-CM

## 2024-12-26 DIAGNOSIS — Z951 Presence of aortocoronary bypass graft: Secondary | ICD-10-CM

## 2024-12-26 DIAGNOSIS — E119 Type 2 diabetes mellitus without complications: Secondary | ICD-10-CM

## 2024-12-26 DIAGNOSIS — Z85118 Personal history of other malignant neoplasm of bronchus and lung: Secondary | ICD-10-CM

## 2024-12-26 DIAGNOSIS — I2510 Atherosclerotic heart disease of native coronary artery without angina pectoris: Secondary | ICD-10-CM | POA: Diagnosis present

## 2024-12-26 DIAGNOSIS — I5023 Acute on chronic systolic (congestive) heart failure: Secondary | ICD-10-CM | POA: Diagnosis present

## 2024-12-26 DIAGNOSIS — R7989 Other specified abnormal findings of blood chemistry: Secondary | ICD-10-CM | POA: Diagnosis not present

## 2024-12-26 DIAGNOSIS — J441 Chronic obstructive pulmonary disease with (acute) exacerbation: Secondary | ICD-10-CM | POA: Diagnosis not present

## 2024-12-26 DIAGNOSIS — J9611 Chronic respiratory failure with hypoxia: Secondary | ICD-10-CM | POA: Diagnosis present

## 2024-12-26 DIAGNOSIS — I509 Heart failure, unspecified: Secondary | ICD-10-CM

## 2024-12-26 DIAGNOSIS — I5043 Acute on chronic combined systolic (congestive) and diastolic (congestive) heart failure: Secondary | ICD-10-CM | POA: Diagnosis present

## 2024-12-26 DIAGNOSIS — E876 Hypokalemia: Secondary | ICD-10-CM | POA: Diagnosis present

## 2024-12-26 DIAGNOSIS — I5082 Biventricular heart failure: Secondary | ICD-10-CM | POA: Diagnosis present

## 2024-12-26 DIAGNOSIS — R54 Age-related physical debility: Secondary | ICD-10-CM | POA: Diagnosis present

## 2024-12-26 DIAGNOSIS — I255 Ischemic cardiomyopathy: Secondary | ICD-10-CM | POA: Diagnosis present

## 2024-12-26 DIAGNOSIS — Z95810 Presence of automatic (implantable) cardiac defibrillator: Secondary | ICD-10-CM | POA: Diagnosis not present

## 2024-12-26 DIAGNOSIS — Z8249 Family history of ischemic heart disease and other diseases of the circulatory system: Secondary | ICD-10-CM

## 2024-12-26 DIAGNOSIS — Z885 Allergy status to narcotic agent status: Secondary | ICD-10-CM

## 2024-12-26 DIAGNOSIS — J9621 Acute and chronic respiratory failure with hypoxia: Secondary | ICD-10-CM | POA: Diagnosis present

## 2024-12-26 DIAGNOSIS — N183 Chronic kidney disease, stage 3 unspecified: Secondary | ICD-10-CM | POA: Diagnosis present

## 2024-12-26 DIAGNOSIS — Z66 Do not resuscitate: Secondary | ICD-10-CM | POA: Diagnosis present

## 2024-12-26 DIAGNOSIS — Z7901 Long term (current) use of anticoagulants: Secondary | ICD-10-CM

## 2024-12-26 DIAGNOSIS — Z87891 Personal history of nicotine dependence: Secondary | ICD-10-CM

## 2024-12-26 DIAGNOSIS — Z833 Family history of diabetes mellitus: Secondary | ICD-10-CM

## 2024-12-26 DIAGNOSIS — I9589 Other hypotension: Secondary | ICD-10-CM | POA: Diagnosis present

## 2024-12-26 DIAGNOSIS — I5041 Acute combined systolic (congestive) and diastolic (congestive) heart failure: Principal | ICD-10-CM | POA: Diagnosis present

## 2024-12-26 DIAGNOSIS — Z9221 Personal history of antineoplastic chemotherapy: Secondary | ICD-10-CM

## 2024-12-26 DIAGNOSIS — Z7984 Long term (current) use of oral hypoglycemic drugs: Secondary | ICD-10-CM

## 2024-12-26 DIAGNOSIS — I480 Paroxysmal atrial fibrillation: Secondary | ICD-10-CM | POA: Diagnosis not present

## 2024-12-26 DIAGNOSIS — I21A1 Myocardial infarction type 2: Secondary | ICD-10-CM | POA: Diagnosis present

## 2024-12-26 DIAGNOSIS — I130 Hypertensive heart and chronic kidney disease with heart failure and stage 1 through stage 4 chronic kidney disease, or unspecified chronic kidney disease: Principal | ICD-10-CM | POA: Diagnosis present

## 2024-12-26 DIAGNOSIS — E739 Lactose intolerance, unspecified: Secondary | ICD-10-CM | POA: Diagnosis present

## 2024-12-26 DIAGNOSIS — K219 Gastro-esophageal reflux disease without esophagitis: Secondary | ICD-10-CM | POA: Diagnosis present

## 2024-12-26 DIAGNOSIS — R64 Cachexia: Secondary | ICD-10-CM | POA: Diagnosis present

## 2024-12-26 DIAGNOSIS — N1832 Chronic kidney disease, stage 3b: Secondary | ICD-10-CM | POA: Diagnosis present

## 2024-12-26 DIAGNOSIS — J9601 Acute respiratory failure with hypoxia: Secondary | ICD-10-CM | POA: Diagnosis present

## 2024-12-26 DIAGNOSIS — E1122 Type 2 diabetes mellitus with diabetic chronic kidney disease: Secondary | ICD-10-CM | POA: Diagnosis present

## 2024-12-26 DIAGNOSIS — Z882 Allergy status to sulfonamides status: Secondary | ICD-10-CM

## 2024-12-26 DIAGNOSIS — Z79899 Other long term (current) drug therapy: Secondary | ICD-10-CM

## 2024-12-26 DIAGNOSIS — Z5982 Transportation insecurity: Secondary | ICD-10-CM

## 2024-12-26 DIAGNOSIS — Z923 Personal history of irradiation: Secondary | ICD-10-CM

## 2024-12-26 DIAGNOSIS — I5084 End stage heart failure: Secondary | ICD-10-CM | POA: Diagnosis present

## 2024-12-26 LAB — COMPREHENSIVE METABOLIC PANEL WITH GFR
ALT: 29 U/L (ref 0–44)
AST: 36 U/L (ref 15–41)
Albumin: 4.2 g/dL (ref 3.5–5.0)
Alkaline Phosphatase: 93 U/L (ref 38–126)
Anion gap: 14 (ref 5–15)
BUN: 34 mg/dL — ABNORMAL HIGH (ref 8–23)
CO2: 22 mmol/L (ref 22–32)
Calcium: 10 mg/dL (ref 8.9–10.3)
Chloride: 100 mmol/L (ref 98–111)
Creatinine, Ser: 1.07 mg/dL — ABNORMAL HIGH (ref 0.44–1.00)
GFR, Estimated: 58 mL/min — ABNORMAL LOW
Glucose, Bld: 148 mg/dL — ABNORMAL HIGH (ref 70–99)
Potassium: 4.1 mmol/L (ref 3.5–5.1)
Sodium: 137 mmol/L (ref 135–145)
Total Bilirubin: 0.8 mg/dL (ref 0.0–1.2)
Total Protein: 7.1 g/dL (ref 6.5–8.1)

## 2024-12-26 LAB — CBC WITH DIFFERENTIAL/PLATELET
Abs Immature Granulocytes: 0.02 K/uL (ref 0.00–0.07)
Basophils Absolute: 0.1 K/uL (ref 0.0–0.1)
Basophils Relative: 1 %
Eosinophils Absolute: 0.2 K/uL (ref 0.0–0.5)
Eosinophils Relative: 3 %
HCT: 46.5 % — ABNORMAL HIGH (ref 36.0–46.0)
Hemoglobin: 15 g/dL (ref 12.0–15.0)
Immature Granulocytes: 0 %
Lymphocytes Relative: 13 %
Lymphs Abs: 0.9 K/uL (ref 0.7–4.0)
MCH: 29.6 pg (ref 26.0–34.0)
MCHC: 32.3 g/dL (ref 30.0–36.0)
MCV: 91.9 fL (ref 80.0–100.0)
Monocytes Absolute: 0.7 K/uL (ref 0.1–1.0)
Monocytes Relative: 9 %
Neutro Abs: 5.6 K/uL (ref 1.7–7.7)
Neutrophils Relative %: 74 %
Platelets: 208 K/uL (ref 150–400)
RBC: 5.06 MIL/uL (ref 3.87–5.11)
RDW: 17.7 % — ABNORMAL HIGH (ref 11.5–15.5)
WBC: 7.5 K/uL (ref 4.0–10.5)
nRBC: 0 % (ref 0.0–0.2)

## 2024-12-26 LAB — I-STAT VENOUS BLOOD GAS, ED
Acid-Base Excess: 2 mmol/L (ref 0.0–2.0)
Bicarbonate: 26.7 mmol/L (ref 20.0–28.0)
Calcium, Ion: 1.21 mmol/L (ref 1.15–1.40)
HCT: 46 % (ref 36.0–46.0)
Hemoglobin: 15.6 g/dL — ABNORMAL HIGH (ref 12.0–15.0)
O2 Saturation: 86 %
Potassium: 3.8 mmol/L (ref 3.5–5.1)
Sodium: 139 mmol/L (ref 135–145)
TCO2: 28 mmol/L (ref 22–32)
pCO2, Ven: 41.9 mmHg — ABNORMAL LOW (ref 44–60)
pH, Ven: 7.412 (ref 7.25–7.43)
pO2, Ven: 51 mmHg — ABNORMAL HIGH (ref 32–45)

## 2024-12-26 LAB — TROPONIN T, HIGH SENSITIVITY
Troponin T High Sensitivity: 44 ng/L — ABNORMAL HIGH (ref 0–19)
Troponin T High Sensitivity: 46 ng/L — ABNORMAL HIGH (ref 0–19)

## 2024-12-26 LAB — PRO BRAIN NATRIURETIC PEPTIDE: Pro Brain Natriuretic Peptide: 12902 pg/mL — ABNORMAL HIGH

## 2024-12-26 LAB — CBG MONITORING, ED: Glucose-Capillary: 143 mg/dL — ABNORMAL HIGH (ref 70–99)

## 2024-12-26 MED ORDER — SODIUM CHLORIDE 0.9% FLUSH
3.0000 mL | Freq: Two times a day (BID) | INTRAVENOUS | Status: DC
  Administered 2024-12-26 – 2024-12-31 (×8): 3 mL via INTRAVENOUS

## 2024-12-26 MED ORDER — AZITHROMYCIN 250 MG PO TABS: 500.0000 mg | ORAL_TABLET | Freq: Every day | ORAL | Status: DC

## 2024-12-26 MED ORDER — IPRATROPIUM-ALBUTEROL 0.5-2.5 (3) MG/3ML IN SOLN
3.0000 mL | Freq: Once | RESPIRATORY_TRACT | Status: AC
  Administered 2024-12-26: 3 mL via RESPIRATORY_TRACT
  Filled 2024-12-26: qty 3

## 2024-12-26 MED ORDER — FUROSEMIDE 10 MG/ML IJ SOLN
80.0000 mg | Freq: Once | INTRAMUSCULAR | Status: AC
  Administered 2024-12-26: 80 mg via INTRAVENOUS
  Filled 2024-12-26: qty 8

## 2024-12-26 MED ORDER — METHYLPREDNISOLONE SODIUM SUCC 125 MG IJ SOLR
125.0000 mg | Freq: Once | INTRAMUSCULAR | Status: AC
  Administered 2024-12-26: 125 mg via INTRAVENOUS
  Filled 2024-12-26: qty 2

## 2024-12-26 MED ORDER — DIGOXIN 0.0625 MG HALF TABLET
0.0625 mg | ORAL_TABLET | Freq: Every day | ORAL | Status: DC
  Administered 2024-12-27 – 2024-12-31 (×5): 0.0625 mg via ORAL
  Filled 2024-12-26 (×6): qty 1

## 2024-12-26 MED ORDER — METHYLPREDNISOLONE SODIUM SUCC 40 MG IJ SOLR
40.0000 mg | Freq: Every day | INTRAMUSCULAR | Status: DC
  Administered 2024-12-27: 40 mg via INTRAVENOUS
  Filled 2024-12-26: qty 1

## 2024-12-26 MED ORDER — SODIUM CHLORIDE 0.9% FLUSH: 3.0000 mL | INTRAVENOUS | Status: DC | PRN

## 2024-12-26 MED ORDER — INSULIN ASPART 100 UNIT/ML IJ SOLN
0.0000 [IU] | Freq: Every day | INTRAMUSCULAR | Status: DC
  Administered 2024-12-27: 2 [IU] via SUBCUTANEOUS
  Filled 2024-12-26: qty 2

## 2024-12-26 MED ORDER — FUROSEMIDE 10 MG/ML IJ SOLN: 80.0000 mg | Freq: Once | INTRAMUSCULAR | Status: DC

## 2024-12-26 MED ORDER — ATORVASTATIN CALCIUM 80 MG PO TABS
80.0000 mg | ORAL_TABLET | Freq: Every day | ORAL | Status: DC
  Administered 2024-12-27 – 2024-12-31 (×5): 80 mg via ORAL
  Filled 2024-12-26 (×2): qty 1
  Filled 2024-12-26: qty 2
  Filled 2024-12-26 (×2): qty 1

## 2024-12-26 MED ORDER — IPRATROPIUM-ALBUTEROL 0.5-2.5 (3) MG/3ML IN SOLN
3.0000 mL | Freq: Four times a day (QID) | RESPIRATORY_TRACT | Status: DC | PRN
  Administered 2024-12-26 – 2024-12-27 (×2): 3 mL via RESPIRATORY_TRACT
  Filled 2024-12-26 (×2): qty 3

## 2024-12-26 MED ORDER — INSULIN ASPART 100 UNIT/ML IJ SOLN
0.0000 [IU] | Freq: Three times a day (TID) | INTRAMUSCULAR | Status: DC
  Administered 2024-12-27: 3 [IU] via SUBCUTANEOUS
  Administered 2024-12-27 – 2024-12-29 (×4): 1 [IU] via SUBCUTANEOUS
  Administered 2024-12-30: 3 [IU] via SUBCUTANEOUS
  Administered 2024-12-30 – 2024-12-31 (×3): 1 [IU] via SUBCUTANEOUS
  Filled 2024-12-26: qty 1
  Filled 2024-12-26: qty 3
  Filled 2024-12-26 (×3): qty 1
  Filled 2024-12-26: qty 3
  Filled 2024-12-26 (×2): qty 1

## 2024-12-26 MED ORDER — DOCUSATE SODIUM 100 MG PO CAPS
100.0000 mg | ORAL_CAPSULE | Freq: Two times a day (BID) | ORAL | Status: DC
  Administered 2024-12-26 – 2024-12-31 (×9): 100 mg via ORAL
  Filled 2024-12-26 (×10): qty 1

## 2024-12-26 MED ORDER — APIXABAN 2.5 MG PO TABS
2.5000 mg | ORAL_TABLET | Freq: Two times a day (BID) | ORAL | Status: DC
  Administered 2024-12-26 – 2024-12-31 (×10): 2.5 mg via ORAL
  Filled 2024-12-26 (×10): qty 1

## 2024-12-26 MED ORDER — SODIUM CHLORIDE 0.9 % IV SOLN: 500.0000 mg | INTRAVENOUS | Status: DC

## 2024-12-26 MED ORDER — SODIUM CHLORIDE 0.9 % IV SOLN: 250.0000 mL | INTRAVENOUS | Status: AC | PRN

## 2024-12-26 MED ORDER — IPRATROPIUM-ALBUTEROL 0.5-2.5 (3) MG/3ML IN SOLN
3.0000 mL | Freq: Four times a day (QID) | RESPIRATORY_TRACT | Status: DC
  Administered 2024-12-27 – 2024-12-30 (×13): 3 mL via RESPIRATORY_TRACT
  Filled 2024-12-26 (×12): qty 3

## 2024-12-26 MED ORDER — AZITHROMYCIN 250 MG PO TABS
500.0000 mg | ORAL_TABLET | Freq: Every day | ORAL | Status: DC
  Administered 2024-12-26 – 2024-12-27 (×2): 500 mg via ORAL
  Filled 2024-12-26 (×2): qty 2

## 2024-12-26 MED ORDER — ALBUMIN HUMAN 25 % IV SOLN: 25.0000 g | INTRAVENOUS | Status: DC

## 2024-12-26 MED ORDER — SPIRONOLACTONE 25 MG PO TABS
25.0000 mg | ORAL_TABLET | Freq: Every day | ORAL | Status: DC
  Administered 2024-12-27 – 2024-12-30 (×4): 25 mg via ORAL
  Filled 2024-12-26 (×3): qty 1
  Filled 2024-12-26: qty 2
  Filled 2024-12-26: qty 1

## 2024-12-26 MED ORDER — FUROSEMIDE 10 MG/ML IJ SOLN: 40.0000 mg | Freq: Once | INTRAMUSCULAR | Status: DC

## 2024-12-26 MED ORDER — ACETAMINOPHEN 325 MG PO TABS: 650.0000 mg | ORAL_TABLET | Freq: Four times a day (QID) | ORAL | Status: DC | PRN

## 2024-12-26 MED ORDER — DAPAGLIFLOZIN PROPANEDIOL 10 MG PO TABS
10.0000 mg | ORAL_TABLET | Freq: Every day | ORAL | Status: DC
  Administered 2024-12-27 – 2024-12-31 (×5): 10 mg via ORAL
  Filled 2024-12-26 (×5): qty 1

## 2024-12-26 MED ORDER — PANTOPRAZOLE SODIUM 40 MG PO TBEC
40.0000 mg | DELAYED_RELEASE_TABLET | Freq: Every day | ORAL | Status: DC
  Administered 2024-12-27 – 2024-12-31 (×5): 40 mg via ORAL
  Filled 2024-12-26 (×5): qty 1

## 2024-12-26 MED ORDER — MAGNESIUM SULFATE 2 GM/50ML IV SOLN
2.0000 g | Freq: Once | INTRAVENOUS | Status: AC
  Administered 2024-12-26: 2 g via INTRAVENOUS
  Filled 2024-12-26: qty 50

## 2024-12-26 MED ORDER — FUROSEMIDE 10 MG/ML IJ SOLN: 80.0000 mg | Freq: Two times a day (BID) | INTRAMUSCULAR | Status: DC

## 2024-12-26 MED ORDER — ACETAMINOPHEN 650 MG RE SUPP: 650.0000 mg | Freq: Four times a day (QID) | RECTAL | Status: DC | PRN

## 2024-12-26 MED ORDER — IVABRADINE HCL 5 MG PO TABS
7.5000 mg | ORAL_TABLET | Freq: Two times a day (BID) | ORAL | Status: DC
  Administered 2024-12-27 – 2024-12-31 (×8): 7.5 mg via ORAL
  Filled 2024-12-26 (×12): qty 1

## 2024-12-26 MED ORDER — SODIUM CHLORIDE 0.9% FLUSH
3.0000 mL | Freq: Two times a day (BID) | INTRAVENOUS | Status: DC
  Administered 2024-12-26 – 2024-12-31 (×5): 3 mL via INTRAVENOUS

## 2024-12-26 MED ORDER — BENZONATATE 100 MG PO CAPS
100.0000 mg | ORAL_CAPSULE | Freq: Three times a day (TID) | ORAL | Status: DC
  Administered 2024-12-26 – 2024-12-31 (×14): 100 mg via ORAL
  Filled 2024-12-26 (×15): qty 1

## 2024-12-26 MED ORDER — ALBUMIN HUMAN 25 % IV SOLN
25.0000 g | Freq: Once | INTRAVENOUS | Status: AC
  Administered 2024-12-26: 25 g via INTRAVENOUS
  Filled 2024-12-26: qty 100

## 2024-12-26 MED ORDER — IPRATROPIUM BROMIDE 0.02 % IN SOLN: 0.5000 mg | Freq: Four times a day (QID) | RESPIRATORY_TRACT | Status: DC | PRN

## 2024-12-26 NOTE — ED Triage Notes (Signed)
 Pt BIB [facility] from extended stay hotel. Pt reports SOB for a couple days that got progressively worse tonight. Pt attempted on CPAP with no relief. EMS arrived and pt was 77%on 3L. CPAP applied, on arrival to the ED pt was 98%.

## 2024-12-26 NOTE — TOC CM/SW Note (Signed)
 TOC consult received for d/c planning needs.   Per notes, patient resides in an extended stay hotel, her sister visits daily. Patient has home O2 at 2L, nebulizer machine and a BiPAP (DME company unknown at this time). Patient also uses a rollator for ambulation.   Patient is followed closely by the HF clinic.  Follow-up to be completed with patient as appropriate.   Merilee Batty, MSN, RN Case Management [phone]

## 2024-12-26 NOTE — H&P (Signed)
 " History and Physical    Brittany [name]  FMW:[id number] DOB: [birth year] DOA: 12/26/2024  PCP: Tanda Bleacher, MD   Patient coming from: Home   Chief Complaint:  Chief Complaint  Patient presents with   Shortness of Breath   ED TRIAGE note:  Pt BIB [facility] from extended stay hotel. Pt reports SOB for a couple days that got progressively worse tonight. Pt attempted on CPAP with no relief. EMS arrived and pt was 77%on 3L. CPAP applied, on arrival to the ED pt was 98%.          HPI:  Brittany [name]  is a 64 y.o. female with medical history significant of HFrEF reduced EF 20 to 25% / end-stage chronic systolic heart failure status post [employer] ICD (on digoxin , Farxiga , spironolactone , ivabradine ), chronic hypoxic respiratory failure, CAD status post CABG x 2 and DES drug-eluting stent placement 2020, paroxysmal atrial fibrillation on Eliquis  2.5 mg twice daily, CKD 3B, COPD, history of renal infarction 2022 on Eliquis , prior history of lung cancer status post chemo and non-insulin -dependent DM type II presented to the emergency department with complaining of shortness of breath which has been progressively getting worse.  Patient also complaining subjective fever and nonproductive cough.  Denies any sick contact.  Reported increasing home oxygen 3 L without any improvement.  Due to  worsening symptoms called EMS. Per EMS initial evaluation patient found hypoxic in low 70s on 3 L oxygen with significant respiratory distress and placed on BiPAP.  Patient is complaining about nonproductive dry cough with associated shortness of breath and wheezing.  Denies fever or chill, nausea vomiting and diarrhea.  Denies chest pain and palpitation.  Patient denies lower extremity swelling.  No other complaint at this time.  ED Course:  At presentation to ED patient has been transition BiPAP to nasal cannula oxygen for liter O2 sat 100%.  Hemodynamically stable except blood pressure is  borderline soft. CBC unremarkable.  CMP unremarkable renal function at baseline.  Elevated troponin 44. Elevated proBNP 12,902. VBG normal pH, low pCO2 41 normal pO2 and bicarb level.  EKG showing sinus tachycardia heart rate 106.  In the ED patient received Lasix  80 mg, DuoNeb nebulizer and Solu-Medrol .  Hospitalist consulted for further evaluation management of acute on chronic CHF exacerbation and COPD exacerbation.   Significant labs in the ED: Lab Orders         Respiratory (~20 pathogens) panel by PCR         CBC with Differential         Comprehensive metabolic panel         Pro Brain natriuretic peptide         Digoxin  level         CBC         Comprehensive metabolic panel         I-Stat venous blood gas, ([facility] ED, [facility], [facility])       Review of Systems:  Review of Systems  Constitutional:  Negative for chills, fever, malaise/fatigue and weight loss.  Respiratory:  Positive for cough, shortness of breath and wheezing. Negative for hemoptysis and sputum production.   Cardiovascular:  Positive for orthopnea and PND. Negative for chest pain, palpitations and leg swelling.  Gastrointestinal:  Negative for abdominal pain, heartburn, nausea and vomiting.  Musculoskeletal:  Negative for joint pain and myalgias.  Neurological:  Negative for dizziness and headaches.  Psychiatric/Behavioral:  The patient is not nervous/anxious.     Past Medical History:  Diagnosis Date  Acute respiratory failure (HCC) 05/05/2018   Acute systolic congestive heart failure (HCC) 02/03/2018   AICD (automatic cardioverter/defibrillator) present    Allergy    Anxiety    Asthma    DM2 (diabetes mellitus, type 2) (HCC) 10/20/2020   Hypertension    PAF (paroxysmal atrial fibrillation) (HCC)    Presence of permanent cardiac pacemaker    Prophylactic measure 08/03/14-08/19/14   Prophyl. 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   LIMA to LAD, SVG to D1, SVG to OM1, EVH via right thigh  with implantation of Impella LD LVAD via direct aortic approach   Small cell lung cancer (HCC) 03/16/2014    Past Surgical History:  Procedure Laterality Date   BRONCHIAL BRUSHINGS  10/25/2020   Procedure: BRONCHIAL BRUSHINGS;  Surgeon: Shelah Lamar RAMAN, MD;  Location: [facility] ENDOSCOPY;  Service: Pulmonary;;   BRONCHIAL NEEDLE ASPIRATION BIOPSY  10/25/2020   Procedure: BRONCHIAL NEEDLE ASPIRATION BIOPSIES;  Surgeon: Shelah Lamar RAMAN, MD;  Location: [facility] ENDOSCOPY;  Service: Pulmonary;;   CARDIAC DEFIBRILLATOR PLACEMENT  08/15/2018   MDT Visia AF MRI VR ICD implanted by Dr Zulma for primary prevention of sudden   CESAREAN SECTION     CORONARY ARTERY BYPASS GRAFT N/A 02/03/2018   Procedure: CORONARY ARTERY BYPASS GRAFTING (CABG);  Surgeon: Dusty Sudie DEL, MD;  Location: [facility] OR;  Service: Open Heart Surgery;  Laterality: N/A;  Time 3 using left internal mammary artery and endoscopically harvested right saphenous vein   CORONARY BALLOON ANGIOPLASTY N/A 02/03/2018   Procedure: CORONARY BALLOON ANGIOPLASTY;  Surgeon: Jordan, Peter M, MD;  Location: [facility] INVASIVE CV LAB;  Service: Cardiovascular;  Laterality: N/A;   CORONARY STENT INTERVENTION N/A 02/12/2019   Procedure: CORONARY STENT INTERVENTION;  Surgeon: Darron Deatrice LABOR, MD;  Location: [facility] INVASIVE CV LAB;  Service: Cardiovascular;  Laterality: N/A;   CORONARY/GRAFT ACUTE MI REVASCULARIZATION N/A 02/03/2018   Procedure: Coronary/Graft Acute MI Revascularization;  Surgeon: Jordan, Peter M, MD;  Location: [facility] [place] INVASIVE CV LAB;  Service: Cardiovascular;  Laterality: N/A;   ENDOBRONCHIAL ULTRASOUND N/A 10/25/2020   Procedure: ENDOBRONCHIAL ULTRASOUND;  Surgeon: Shelah Lamar RAMAN, MD;  Location: [facility] ENDOSCOPY;  Service: Pulmonary;  Laterality: N/A;   FLEXIBLE BRONCHOSCOPY  10/25/2020   Procedure: FLEXIBLE BRONCHOSCOPY;  Surgeon: Shelah Lamar RAMAN, MD;  Location: [facility] ENDOSCOPY;  Service: Pulmonary;;   IABP INSERTION N/A 02/03/2018   Procedure: IABP Insertion;   Surgeon: Jordan, Peter M, MD;  Location: [facility] INVASIVE CV LAB;  Service: Cardiovascular;  Laterality: N/A;   INTRAOPERATIVE TRANSESOPHAGEAL ECHOCARDIOGRAM N/A 02/03/2018   Procedure: INTRAOPERATIVE TRANSESOPHAGEAL ECHOCARDIOGRAM;  Surgeon: Dusty Sudie DEL, MD;  Location: [facility] OR;  Service: Open Heart Surgery;  Laterality: N/A;   LEFT HEART CATH AND CORONARY ANGIOGRAPHY N/A 02/03/2018   Procedure: LEFT HEART CATH AND CORONARY ANGIOGRAPHY;  Surgeon: Jordan, Peter M, MD;  Location: [facility] INVASIVE CV LAB;  Service: Cardiovascular;  Laterality: N/A;   LEFT HEART CATH AND CORS/GRAFTS ANGIOGRAPHY N/A 02/12/2019   Procedure: LEFT HEART CATH AND CORS/GRAFTS ANGIOGRAPHY;  Surgeon: Darron Deatrice LABOR, MD;  Location: [facility] INVASIVE CV LAB;  Service: Cardiovascular;  Laterality: N/A;   MEDIASTINOSCOPY N/A 03/11/2014   Procedure: MEDIASTINOSCOPY;  Surgeon: Elspeth JAYSON Millers, MD;  Location: [facility] OR;  Service: Thoracic;  Laterality: N/A;   PLACEMENT OF IMPELLA LEFT VENTRICULAR ASSIST DEVICE  02/03/2018   Procedure: PLACEMENT OF IMPELLA LEFT VENTRICULAR ASSIST DEVICE LD;  Surgeon: Dusty Sudie DEL, MD;  Location: [facility] OR;  Service: Open Heart Surgery;;  REMOVAL OF IMPELLA LEFT VENTRICULAR ASSIST DEVICE N/A 02/08/2018   Procedure: REMOVAL OF IMPELLA LEFT VENTRICULAR ASSIST DEVICE;  Surgeon: Dusty Sudie DEL, MD;  Location: [facility] OR;  Service: Open Heart Surgery;  Laterality: N/A;   RIGHT HEART CATH N/A 02/03/2018   Procedure: RIGHT HEART CATH;  Surgeon: Jordan, Peter M, MD;  Location: [name] INVASIVE CV LAB;  Service: Cardiovascular;  Laterality: N/A;   RIGHT HEART CATH N/A 05/09/2018   Procedure: RIGHT HEART CATH;  Surgeon: Cherrie Toribio SAUNDERS, MD;  Location: [facility] INVASIVE CV LAB;  Service: Cardiovascular;  Laterality: N/A;   TEE WITHOUT CARDIOVERSION N/A 02/08/2018   Procedure: TRANSESOPHAGEAL ECHOCARDIOGRAM (TEE);  Surgeon: Dusty Sudie DEL, MD;  Location: [facility] OR;  Service: Open Heart Surgery;  Laterality: N/A;   TUBAL LIGATION     VIDEO  BRONCHOSCOPY WITH ENDOBRONCHIAL ULTRASOUND N/A 03/11/2014   Procedure: VIDEO BRONCHOSCOPY WITH ENDOBRONCHIAL ULTRASOUND;  Surgeon: Elspeth JAYSON Millers, MD;  Location: [facility] OR;  Service: Thoracic;  Laterality: N/A;     reports that she quit smoking about 10 years ago. Her smoking use included cigarettes. She started smoking about 30 years ago. She has a 20 pack-year smoking history. She has never used smokeless tobacco. She reports current alcohol use of about 5.0 standard drinks of alcohol per week. She reports current drug use. Drug: Marijuana.  Allergies[1]  Family History  Problem Relation Age of Onset   Heart disease Mother    Hypertension Mother    Heart attack Mother    Hypertension Maternal Grandmother    Cancer Maternal Grandmother    Diabetes Paternal Grandmother    Stroke Neg Hx     Prior to Admission medications  Medication Sig Start Date End Date Taking? Authorizing Provider  albuterol  (VENTOLIN  HFA) 108 (90 Base) MCG/ACT inhaler Inhale 2 puffs into the lungs every 6 (six) hours as needed for wheezing or shortness of breath. 06/20/22   Fairy Frames, MD  atorvastatin  (LIPITOR ) 80 MG tablet Take 1 tablet (80 mg total) by mouth daily. 10/22/24 01/20/25  Glena Harlene HERO, FNP  cyclobenzaprine  (FLEXERIL ) 10 MG tablet Take 1 tablet (10 mg total) by mouth 3 (three) times daily as needed for muscle spasms. 10/11/23   Tanda Bleacher, MD  dapagliflozin  propanediol (FARXIGA ) 10 MG TABS tablet Take 1 tablet (10 mg total) by mouth daily. 10/22/24   Glena Harlene HERO, FNP  Digoxin  62.5 MCG TABS Take 0.0625 mg by mouth daily. 11/17/24   Colletta Manuelita Garre, PA-C  Dihydroxyaluminum Sod Carb (ROLAIDS PO) Take 1 tablet by mouth as needed.    [provider]  ELIQUIS  2.5 MG TABS tablet TAKE 1 TABLET BY MOUTH TWICE A DAY 11/11/24   [name], Harlene HERO, FNP  Furosemide  (FUROSCIX ) 80 MG/10ML CTKT Inject 80 mg into the skin daily as needed (fluid and edema). Take as directed by the heart  failure clinic only 10/22/24   [name], Harlene HERO, FNP  hydrOXYzine  (VISTARIL ) 25 MG capsule TAKE 1 CAPSULE (25 MG TOTAL) BY MOUTH DAILY AS NEEDED. 07/30/23   Jaycee Greig PARAS, NP  ipratropium-albuterol  (DUONEB) 0.5-2.5 (3) MG/3ML SOLN TAKE 3 MLS BY NEBULIZATION EVERY 4 (FOUR) HOURS AS NEEDED (FOR SHORTNESS OF BREATH). 10/03/24   Tanda Bleacher, MD  ivabradine  (CORLANOR ) 7.5 MG TABS tablet Take 1 tablet (7.5 mg total) by mouth 2 (two) times daily with a meal. 10/22/24   [name], Harlene HERO, FNP  megestrol  (MEGACE ) 20 MG tablet Take 1 tablet (20 mg total) by mouth 2 (two) times daily. 02/26/24  Tanda Bleacher, MD  metolazone  (ZAROXOLYN ) 5 MG tablet Take 1 tablet (5 mg total) by mouth as needed (as instructed by Advanced HF clinic). 10/22/24   Glena Harlene HERO, FNP  Multiple Vitamin (MULTIVITAMIN WITH MINERALS) TABS tablet Take 1 tablet by mouth daily. Gummies    [provider]  nitroGLYCERIN  (NITROSTAT ) 0.4 MG SL tablet Place 1 tablet (0.4 mg total) under the tongue every 5 (five) minutes as needed for chest pain. 03/13/24   Lee, Jordan, NP  Nutritional Supplements (ENSURE ORIGINAL) LIQD Take 1 Bottle by mouth 2 (two) times daily as needed (poor appetite).    [provider]  pantoprazole  (PROTONIX ) 40 MG tablet Take 1 tablet (40 mg total) by mouth daily at 6 (six) AM. 07/21/24   Cherlyn Labella, MD  potassium chloride  (KLOR-CON ) 20 MEQ packet Take 80 mEq by mouth 2 (two) times daily. TAKE 80 MEQ TOTAL TWICE A DAY EXCEPT ON METOLAZONE  DAYS OF TUESDAYS TAKE (100 MEQ) 3 TIMES PER DAY. 10/22/24   [name], Harlene HERO, FNP  spironolactone  (ALDACTONE ) 25 MG tablet Take 1 tablet (25 mg total) by mouth daily. 10/22/24   [name], Harlene HERO, FNP  torsemide  (DEMADEX ) 100 MG tablet Take 1 tablet (100 mg total) by mouth 2 (two) times daily. 10/22/24   Glena Harlene HERO, FNP     Physical Exam: Vitals:   12/26/24 1957 12/26/24 2000 12/26/24 2002 12/26/24 2200  BP:    112/80  Pulse: (!) 107   96   Resp: (!) 23   (!) 21  Temp:  98.7 F (37.1 C)    TempSrc:  Oral    SpO2: 100%  100% 100%  Weight:      Height:        Physical Exam Vitals and nursing note reviewed.  Constitutional:      Appearance: She is ill-appearing.  Cardiovascular:     Rate and Rhythm: Regular rhythm. Tachycardia present.  Pulmonary:     Effort: Pulmonary effort is normal. No tachypnea, accessory muscle usage or respiratory distress.     Breath sounds: No stridor. Wheezing and rales present. No rhonchi.  Chest:     Chest wall: No tenderness. There is no dullness to percussion.  Musculoskeletal:     Cervical back: Neck supple.     Right lower leg: No edema.     Left lower leg: No edema.  Skin:    Capillary Refill: Capillary refill takes less than 2 seconds.  Neurological:     Mental Status: She is alert and oriented to person, place, and time.  Psychiatric:        Mood and Affect: Mood normal.      Labs on Admission: I have personally reviewed following labs and imaging studies  CBC: Recent Labs  Lab 12/26/24 2003 12/26/24 2038  WBC 7.5  --   NEUTROABS 5.6  --   HGB 15.0 15.6*  HCT 46.5* 46.0  MCV 91.9  --   PLT 208  --    Basic Metabolic Panel: Recent Labs  Lab 12/26/24 2003 12/26/24 2038  NA 137 139  K 4.1 3.8  CL 100  --   CO2 22  --   GLUCOSE 148*  --   BUN 34*  --   CREATININE 1.07*  --   CALCIUM  10.0  --    GFR: Estimated Creatinine Clearance: 33.1 mL/min (A) (by C-G formula based on SCr of 1.07 mg/dL (H)). Liver Function Tests: Recent Labs  Lab 12/26/24 2003  AST 36  ALT 29  ALKPHOS 93  BILITOT 0.8  PROT 7.1  ALBUMIN  4.2   No results for input(s): LIPASE, AMYLASE in the last 168 hours. No results for input(s): AMMONIA in the last 168 hours. Coagulation Profile: No results for input(s): INR, PROTIME in the last 168 hours. Cardiac Enzymes: No results for input(s): CKTOTAL, CKMB, CKMBINDEX, TROPONINI, TROPONINIHS in the last 168  hours. BNP (last 3 results) Recent Labs    09/16/24 1115 10/22/24 1526 11/17/24 1618  BNP 1,596.2* 1,589.7* 1,898.1*   HbA1C: No results for input(s): HGBA1C in the last 72 hours. CBG: No results for input(s): GLUCAP in the last 168 hours. Lipid Profile: No results for input(s): CHOL, HDL, LDLCALC, TRIG, CHOLHDL, LDLDIRECT in the last 72 hours. Thyroid  Function Tests: No results for input(s): TSH, T4TOTAL, FREET4, T3FREE, THYROIDAB in the last 72 hours. Anemia Panel: No results for input(s): VITAMINB12, FOLATE, FERRITIN, TIBC, IRON, RETICCTPCT in the last 72 hours. Urine analysis:    Component Value Date/Time   COLORURINE YELLOW 07/17/2024 1500   APPEARANCEUR CLEAR 07/17/2024 1500   LABSPEC 1.007 07/17/2024 1500   PHURINE 6.0 07/17/2024 1500   GLUCOSEU >=500 (A) 07/17/2024 1500   HGBUR NEGATIVE 07/17/2024 1500   BILIRUBINUR NEGATIVE 07/17/2024 1500   KETONESUR NEGATIVE 07/17/2024 1500   PROTEINUR NEGATIVE 07/17/2024 1500   UROBILINOGEN 1.0 03/15/2015 1117   NITRITE NEGATIVE 07/17/2024 1500   LEUKOCYTESUR NEGATIVE 07/17/2024 1500    Radiological Exams on Admission: I have personally reviewed images DG Chest Portable 1 View Result Date: 12/26/2024 CLINICAL DATA:  Shortness of breath EXAM: PORTABLE CHEST 1 VIEW COMPARISON:  08/18/2024 FINDINGS: Pain post sternotomy changes. Left-sided pacing device. Right-sided stimulator generator with ascending lead. Cardiomegaly. Small pleural effusions are suspected. No focal consolidation. Aortic atherosclerosis. IMPRESSION: Cardiomegaly with small pleural effusions. Electronically Signed   By: Luke Bun M.D.   On: 12/26/2024 20:11     EKG: My personal interpretation of EKG shows: Sinus tachycardia heart rate 106 and prolonged QTc 546 ms.    Assessment/Plan: Principal Problem:   Acute combined systolic and diastolic CHF, NYHA class 4 (HCC) Active Problems:   Acute hypoxic respiratory  failure (HCC)   History of implantable cardioverter-defibrillator (ICD) placement   Elevated troponin   COPD with acute exacerbation (HCC)   Paroxysmal atrial fibrillation (HCC)   Non-insulin  dependent type 2 diabetes mellitus (HCC)   CKD stage 3b, GFR 30-44 ml/min (HCC)   Chronic hypoxic respiratory failure (HCC)   Acute on chronic HFrEF (heart failure with reduced ejection fraction) (HCC)   Heart failure, diastolic, due to CAD (HCC)   Acute exacerbation of CHF (congestive heart failure) (HCC)    Assessment and Plan: Acute on chronic CHF exacerbation History of HFrEF 20 to 25% status post AICD End-stage heart failure -Patient presented to the emergency department complaining of worsening orthopnea, dyspnea paraspinal Chelle dyspnea and associated dry cough and wheezing.  Patient reported she has been compliant with medication however still experiencing the symptoms.  Denies any fever and chill.  EMS found O2 sat 70% relator oxygen initially placed on CPAP and when patient has been transported to emergency department transition to nasal cannula oxygen for liter O2 sat 100%.  Blood pressure borderline soft otherwise hemodynamically stable. - Further workup revealed both COPD and CHF exacerbation. -CBC unremarkable.  CMP unremarkable renal function at baseline.  Elevated troponin 44. Elevated proBNP 12,902. VBG normal pH, low pCO2 41 normal pO2 and bicarb level. - EKG showing sinus tachycardia heart rate 106. -  In the ED patient received Lasix  80 mg, DuoNeb nebulizer and Solu-Medrol . -Continue Lasix  80 mg twice daily. - Due to chronic hypotension GDMT cardiac medication is restricted except currently patient is on digoxin  twice daily, ivabradine  7.5 mg twice daily, Farxiga  10 mg daily and spironolactone  25 mg daily. - Strict I's/O, daily weight and monitor urine output. - Please consult cardiology/advanced heart failure team in the daytime for further recommendation. - Obtain echocardiogram  and continue cardiac monitoring. -Continue supplemental oxygen to titrated to keep O2 sat above 92%.  Acute on chronic hypoxic respiratory failure-secondary to CHF and COPD exacerbation COPD with acute exacerbation -Pending respiratory panel. - In the ED patient received Solu-Medrol  125 mg - Continue IV Solu-Medrol  40 mg daily, DuoNebs scheduled and as needed.  Continue azithromycin . - Continue supportive care.  Elevated troponin-secondary to demand ischemia -Past troponin 44.  Pending second troponin level.  Elevated troponin in setting of demand ischemia in the context of CHF and COPD exacerbation.  Continue to monitor for development of any chest pain or chest pressure and continue cardiac monitoring for any ST-T wave changes.  Paroxysmal atrial fibrillation -Continue Eliquis  2.5 mg twice daily  History of CAD status post CABG x 2 - Continue Eliquis  and statin.  History of lung cancer status post chemotherapy treatment completed 2015  CKD stage IIIb -Stable renal function continue to monitor  Non-insulin -dependent DM type II -Continue Farxiga  and sliding scale insulin  and mealtime coverage.  GERD - Continue Protonix .   History of renal infraction - Continue Eliquis    DVT prophylaxis:  Eliquis , SCD TED hose Code Status:  Full Code Diet: Heart healthy carb modified diet with fluid restriction 2 L/day and salt restriction 2 g/day Family Communication:   Family was present at bedside, at the time of interview. Opportunity was given to ask question and all questions were answered satisfactorily.  Disposition Plan: Continue to monitor improvement of volume status and monitor urine output. Need to consult heart failure team in the daytime for further recommendation/comanagement. Admission status:   Inpatient, Telemetry bed  Severity of Illness: The appropriate patient status for this patient is INPATIENT. Inpatient status is judged to be reasonable and necessary in order to  provide the required intensity of service to ensure the patient's safety. The patient's presenting symptoms, physical exam findings, and initial radiographic and laboratory data in the context of their chronic comorbidities is felt to place them at high risk for further clinical deterioration. Furthermore, it is not anticipated that the patient will be medically stable for discharge from the hospital within 2 midnights of admission.   * I certify that at the point of admission it is my clinical judgment that the patient will require inpatient hospital care spanning beyond 2 midnights from the point of admission due to high intensity of service, high risk for further deterioration and high frequency of surveillance required.DEWAINE    Dekota Kirlin, MD [facility]  How to contact the [facility] Attending or Consulting provider 7A - 7P or covering provider during after hours 7P -7A, for this patient.  Check the care team in [facility] and look for a) attending/consulting [facility] provider listed and b) the [facility] team listed Log into [email or web address] and use [facility]'s universal password to access. If you do not have the password, please contact the hospital operator. Locate the [facility] provider you are looking for under [facility] and page to a number that you can be directly reached. If you still have difficulty reaching the provider, please  page the [facility] (Director on Call) for the Hospitalists listed on amion for assistance.  12/26/2024, 10:16 PM           [1]  Allergies Allergen Reactions   Sulfa Antibiotics Hives    Patient requested to have this undeleted, had this reaction about 5 years ago.   Lactose Intolerance (Gi) Diarrhea   Codeine Nausea And Vomiting   "

## 2024-12-26 NOTE — Telephone Encounter (Signed)
 Called to confirm/remind patient of their appointment at the [facility] Failure Clinic on 12/29/24.   Appointment:   [x] Confirmed  [] Left mess   [] No answer/No voice mail  [] VM Full/unable to leave message  [] Phone not in service  Patient reminded to bring all medications and/or complete list.  Confirmed patient has transportation. Gave directions, instructed to utilize valet parking.

## 2024-12-26 NOTE — Plan of Care (Signed)
 Patient developing respiratory distress.  Placing BiPAP and giving DuoNeb breathing treatment.  Discussed with patient's bedside RN.  Escalating level of care from telemetry to progressive unit.  Raaga Maeder, MD [facility] 12/27/2024, 12:55 AM

## 2024-12-26 NOTE — Telephone Encounter (Signed)
 error

## 2024-12-26 NOTE — ED Provider Notes (Signed)
 " Brittany [name] Provider Note   CSN: [id number] Arrival date & time: 12/26/24  8048     Patient presents with: Shortness of Breath   Brittany [name]  is a 64 y.o. female with a past medical history notable for paroxysmal A-fib, HFrEF EF < 20% (2025), CAD status post CABG and PCI, right renal infarct, COPD on baseline 2 L nasal cannula, prior small cell lung cancer status post chemo, chest XRT who presents today for evaluation of shortness of breath.  Patient reports that for the past 2 days she has had worsening shortness of breath as well as orthopnea.  Has also had a nonproductive cough during this time.  Subjective fevers.  Denies any known sick contacts.  Has also had to increase her home oxygen to 3 L without significant improvement.  Due to worsening symptoms EMS was called today.  Initially noted to be hypoxic in the 70s on 3 L nasal cannula with significant respiratory distress.  Placed on positive pressure.  No additional medications provided en route.   {Add pertinent medical, surgical, social history, OB history to HPI:32947}  Shortness of Breath      Prior to Admission medications  Medication Sig Start Date End Date Taking? Authorizing Provider  albuterol  (VENTOLIN  HFA) 108 (90 Base) MCG/ACT inhaler Inhale 2 puffs into the lungs every 6 (six) hours as needed for wheezing or shortness of breath. 06/20/22   Fairy Frames, MD  atorvastatin  (LIPITOR ) 80 MG tablet Take 1 tablet (80 mg total) by mouth daily. 10/22/24 01/20/25  Glena Harlene HERO, FNP  cyclobenzaprine  (FLEXERIL ) 10 MG tablet Take 1 tablet (10 mg total) by mouth 3 (three) times daily as needed for muscle spasms. 10/11/23   Tanda Bleacher, MD  dapagliflozin  propanediol (FARXIGA ) 10 MG TABS tablet Take 1 tablet (10 mg total) by mouth daily. 10/22/24   Glena Harlene HERO, FNP  Digoxin  62.5 MCG TABS Take 0.0625 mg by mouth daily. 11/17/24   Colletta Manuelita Garre, PA-C   Dihydroxyaluminum Sod Carb (ROLAIDS PO) Take 1 tablet by mouth as needed.    [provider]  ELIQUIS  2.5 MG TABS tablet TAKE 1 TABLET BY MOUTH TWICE A DAY 11/11/24   [name], [place], FNP  Furosemide  (FUROSCIX ) 80 MG/10ML CTKT Inject 80 mg into the skin daily as needed (fluid and edema). Take as directed by the heart failure clinic only 10/22/24   [name], Harlene HERO, FNP  hydrOXYzine  (VISTARIL ) 25 MG capsule TAKE 1 CAPSULE (25 MG TOTAL) BY MOUTH DAILY AS NEEDED. 07/30/23   Jaycee Greig PARAS, NP  ipratropium-albuterol  (DUONEB) 0.5-2.5 (3) MG/3ML SOLN TAKE 3 MLS BY NEBULIZATION EVERY 4 (FOUR) HOURS AS NEEDED (FOR SHORTNESS OF BREATH). 10/03/24   Tanda Bleacher, MD  ivabradine  (CORLANOR ) 7.5 MG TABS tablet Take 1 tablet (7.5 mg total) by mouth 2 (two) times daily with a meal. 10/22/24   [name], Harlene HERO, FNP  megestrol  (MEGACE ) 20 MG tablet Take 1 tablet (20 mg total) by mouth 2 (two) times daily. 02/26/24   Tanda Bleacher, MD  metolazone  (ZAROXOLYN ) 5 MG tablet Take 1 tablet (5 mg total) by mouth as needed (as instructed by [facility]). 10/22/24   Glena Harlene HERO, FNP  Multiple Vitamin (MULTIVITAMIN WITH MINERALS) TABS tablet Take 1 tablet by mouth daily. Gummies    [provider]  nitroGLYCERIN  (NITROSTAT ) 0.4 MG SL tablet Place 1 tablet (0.4 mg total) under the tongue every 5 (five) minutes as needed for chest pain. 03/13/24  Lee, Jordan, NP  Nutritional Supplements (ENSURE ORIGINAL) LIQD Take 1 Bottle by mouth 2 (two) times daily as needed (poor appetite).    [provider]  pantoprazole  (PROTONIX ) 40 MG tablet Take 1 tablet (40 mg total) by mouth daily at 6 (six) AM. 07/21/24   Cherlyn Labella, MD  potassium chloride  (KLOR-CON ) 20 MEQ packet Take 80 mEq by mouth 2 (two) times daily. TAKE 80 MEQ TOTAL TWICE A DAY EXCEPT ON METOLAZONE  DAYS OF TUESDAYS TAKE (100 MEQ) 3 TIMES PER DAY. 10/22/24   [name], Harlene HERO, FNP  spironolactone  (ALDACTONE ) 25 MG tablet Take 1  tablet (25 mg total) by mouth daily. 10/22/24   [name], Harlene HERO, FNP  torsemide  (DEMADEX ) 100 MG tablet Take 1 tablet (100 mg total) by mouth 2 (two) times daily. 10/22/24   Glena Harlene HERO, FNP    Allergies: Lactose intolerance (gi) and Codeine    Review of Systems  Respiratory:  Positive for shortness of breath.     Updated Vital Signs There were no vitals taken for this visit.  Physical Exam  (all labs ordered are listed, but only abnormal results are displayed) Labs Reviewed - No data to display  EKG: None  Radiology: No results found.  {Document cardiac monitor, telemetry assessment procedure when appropriate:32947} Procedures   Medications Ordered in the ED - No data to display    {Click here for ABCD2, HEART and other calculators REFRESH Note before signing:1}                              Medical Decision Making  Patient is a 64 year old female who presents today for evaluation of respiratory stress with known history of COPD as well as significant HFrEF.  On initial assessment patient was noted to be in respiratory distress with notable increased work of breathing.  Patient does have diminished lung sounds throughout with expiratory wheezing heard bilaterally.  Lower extremities without evidence of significant edema.  {Document critical care time when appropriate  Document review of labs and clinical decision tools ie CHADS2VASC2, etc  Document your independent review of radiology images and any outside records  Document your discussion with family members, caretakers and with consultants  Document social determinants of health affecting pt's care  Document your decision making why or why not admission, treatments were needed:32947:::1}   Final diagnoses:  None    ED Discharge Orders     None        "

## 2024-12-27 ENCOUNTER — Inpatient Hospital Stay (HOSPITAL_COMMUNITY)

## 2024-12-27 DIAGNOSIS — I25810 Atherosclerosis of coronary artery bypass graft(s) without angina pectoris: Secondary | ICD-10-CM | POA: Diagnosis not present

## 2024-12-27 DIAGNOSIS — E872 Acidosis, unspecified: Secondary | ICD-10-CM | POA: Diagnosis not present

## 2024-12-27 DIAGNOSIS — I130 Hypertensive heart and chronic kidney disease with heart failure and stage 1 through stage 4 chronic kidney disease, or unspecified chronic kidney disease: Secondary | ICD-10-CM | POA: Diagnosis not present

## 2024-12-27 DIAGNOSIS — Z7901 Long term (current) use of anticoagulants: Secondary | ICD-10-CM

## 2024-12-27 DIAGNOSIS — I255 Ischemic cardiomyopathy: Secondary | ICD-10-CM | POA: Diagnosis not present

## 2024-12-27 DIAGNOSIS — I5041 Acute combined systolic (congestive) and diastolic (congestive) heart failure: Secondary | ICD-10-CM

## 2024-12-27 DIAGNOSIS — I480 Paroxysmal atrial fibrillation: Secondary | ICD-10-CM | POA: Diagnosis not present

## 2024-12-27 LAB — COMPREHENSIVE METABOLIC PANEL WITH GFR
ALT: 28 U/L (ref 0–44)
AST: 32 U/L (ref 15–41)
Albumin: 4.5 g/dL (ref 3.5–5.0)
Alkaline Phosphatase: 85 U/L (ref 38–126)
Anion gap: 16 — ABNORMAL HIGH (ref 5–15)
BUN: 39 mg/dL — ABNORMAL HIGH (ref 8–23)
CO2: 19 mmol/L — ABNORMAL LOW (ref 22–32)
Calcium: 10 mg/dL (ref 8.9–10.3)
Chloride: 99 mmol/L (ref 98–111)
Creatinine, Ser: 1.25 mg/dL — ABNORMAL HIGH (ref 0.44–1.00)
GFR, Estimated: 48 mL/min — ABNORMAL LOW
Glucose, Bld: 288 mg/dL — ABNORMAL HIGH (ref 70–99)
Potassium: 4.3 mmol/L (ref 3.5–5.1)
Sodium: 134 mmol/L — ABNORMAL LOW (ref 135–145)
Total Bilirubin: 0.9 mg/dL (ref 0.0–1.2)
Total Protein: 7.2 g/dL (ref 6.5–8.1)

## 2024-12-27 LAB — RESPIRATORY PANEL BY PCR

## 2024-12-27 LAB — CBG MONITORING, ED
Glucose-Capillary: 254 mg/dL — ABNORMAL HIGH (ref 70–99)
Glucose-Capillary: 167 mg/dL — ABNORMAL HIGH (ref 70–99)

## 2024-12-27 LAB — CBC
HCT: 44.3 % (ref 36.0–46.0)
Hemoglobin: 13.9 g/dL (ref 12.0–15.0)
MCH: 28.7 pg (ref 26.0–34.0)
MCHC: 31.4 g/dL (ref 30.0–36.0)
MCV: 91.5 fL (ref 80.0–100.0)
Platelets: 197 K/uL (ref 150–400)
RBC: 4.84 MIL/uL (ref 3.87–5.11)
RDW: 17.1 % — ABNORMAL HIGH (ref 11.5–15.5)
WBC: 7.5 K/uL (ref 4.0–10.5)
nRBC: 0 % (ref 0.0–0.2)

## 2024-12-27 LAB — GLUCOSE, CAPILLARY: Glucose-Capillary: 240 mg/dL — ABNORMAL HIGH (ref 70–99)

## 2024-12-27 LAB — LACTIC ACID, PLASMA
Lactic Acid, Venous: 2.2 mmol/L (ref 0.5–1.9)
Lactic Acid, Venous: 2 mmol/L (ref 0.5–1.9)
Lactic Acid, Venous: 2.1 mmol/L (ref 0.5–1.9)
Lactic Acid, Venous: 3.2 mmol/L (ref 0.5–1.9)

## 2024-12-27 LAB — DIGOXIN LEVEL: Digoxin Level: <0.6 ng/mL — ABNORMAL LOW (ref 0.8–2.0)

## 2024-12-27 MED ORDER — LORAZEPAM 2 MG/ML IJ SOLN
0.5000 mg | INTRAMUSCULAR | Status: AC
  Administered 2024-12-27: 0.5 mg via INTRAVENOUS
  Filled 2024-12-27: qty 1

## 2024-12-27 MED ORDER — METOLAZONE 5 MG PO TABS
5.0000 mg | ORAL_TABLET | Freq: Two times a day (BID) | ORAL | Status: DC
  Administered 2024-12-27 – 2024-12-28 (×4): 5 mg via ORAL
  Filled 2024-12-27 (×6): qty 1

## 2024-12-27 MED ORDER — FUROSEMIDE 10 MG/ML IJ SOLN
20.0000 mg/h | INTRAVENOUS | Status: DC
  Administered 2024-12-27 – 2024-12-29 (×5): 20 mg/h via INTRAVENOUS
  Filled 2024-12-27 (×5): qty 200

## 2024-12-27 MED ORDER — MIDODRINE HCL 5 MG PO TABS
10.0000 mg | ORAL_TABLET | ORAL | Status: AC
  Administered 2024-12-27: 10 mg via ORAL
  Filled 2024-12-27: qty 2

## 2024-12-27 MED ORDER — KETAMINE HCL 50 MG/5ML IJ SOSY: 0.3000 mg/kg | PREFILLED_SYRINGE | Freq: Once | INTRAMUSCULAR | Status: DC

## 2024-12-27 NOTE — Plan of Care (Addendum)
" °  Patient is significant respiratory distress and she is not keeping the BiPAP on due to discomfort.  Tripoding.  Concern for requirement of intubation for respiratory distress.  Paged PCCM Dr. Zaida to evaluate patient at bedside.   Addendum by the bedside ED physician Dr. Jerral discussed with the patient and she wished to continue DNR status and declined intubation.  However patient is in significant respiratory distress requiring BiPAP however patient declining BiPAP as well.   Discussed case with Dr. Donzetta given patient is DNR and she is not agreeable to be on mechanical ventilation the best treatment option would be continue BiPAP and at this time there would be no benefit to transfer patient to ICU.  Dr. Zaida recommended continue BiPAP and continue current management.  Brittany Stoklosa, MD [facility] 12/27/2024, 1:28 AM      "

## 2024-12-27 NOTE — ED Notes (Signed)
 Bladder scanned pt reading of 0ml in bladder.

## 2024-12-27 NOTE — ED Notes (Signed)
 Candelaria Pinal, niece, requests to be added as a contact for patient. [phone]

## 2024-12-27 NOTE — Consult Note (Signed)
 "  Cardiology Consultation  Patient ID: Brittany [name]  MRN: [id number]; DOB: 1961/10/21  Admit date: 12/26/2024 Date of Consult: 12/27/2024  PCP:  Tanda Bleacher, MD   [facility] Providers Cardiologist:  None  Electrophysiologist:  OLE ONEIDA HOLTS, MD (Inactive)  Advanced Heart Failure:  Toribio Fuel, MD    Patient Profile: Brittany [name]  is a 64 y.o. female with a hx of PAF, CAD s/p emergent CABG in 2019 and DES in 2020, R renal infarct in 2022, previous smoker quit in 2015, hypertension, previous small cell lung cancer treated with chemo, chest XRT and prophylactic brain radiation in 2015, CKD stage IIIb, chronic hypoxic respiratory failure/COPD, paroxysmal atrial fibrillation, type 2 diabetes and chronic biventricular CHF s/p Medtronic ICD and Barostim who is being seen 12/27/2024 for the evaluation of CHF at the request of Dr. Fairy.  History of Present Illness: Brittany [name]  has a past medical history as listed above.  She presented to the [facility] emergency department on 12/26/2024 complaining of shortness of breath that gotten progressively worse over the last 3 days.  When EMS arrived, patient had SpO2 77% on 3 L, and CPAP was applied, patient arrived to the ED with SpO2 98%.  She was then transition to BiPAP.  Relevant workup in the ER includes: proBNP elevated at 12,902, lactic acid 2.2, CMP showed creatinine 1.07 ? 1.25, otherwise unremarkable, CBC unremarkable, troponin 44 ? 46.  CXR showed small pleural effusions, mild interstitial edema. EKG with significant artifact.   She has been admitted to the medicine service.  She was continued on home digoxin , Farxiga , spironolactone , Corlanor .  She was started on IV Lasix  receiving 80 mg x 1 dose then subsequently started on continuous Lasix  drip.  Due to minimal response with this, metolazone  5 mg BID has been added.  Presently SpO2 100% on 4L via [place], most recent BP 120/80, HR in the 80s.  She  is followed closely by advanced heart failure team as an outpatient, last seen by [name], FNP November 2025 for follow-up.  At this appointment she noted that she was doing fairly well, breathing had been stable for about 1.5 weeks, not requiring inhalers or breathing treatments during this time.  She reported some mild dyspnea when walking on flat ground but reported doing well with ADLs and cooking.  She denied any other symptoms at this time.  Her weight was 87 pounds.  She was noted to be volume up both on exam and by device (OptiVol was significantly elevated) so she was instructed to use Furoscix  BID x 2 days, holding torsemide  during this time.  After that she would return to her home regimen of: Torsemide  100 mg BID + 80 mEq potassium BID, digoxin  0.0625 mg daily, Farxiga  10 mg daily, spironolactone  25 mg daily, ivabradine   7.5 mg BID.  She is noted to not be a candidate for advanced therapies due to severity of lung disease and frailty.  There were no episodes of atrial fibrillation noted on her device interrogation, she was continued on reduced dose Eliquis  due to her weight, renal function.  She remained on her other home medications for her CAD including Lipitor  80 mg daily.  Echocardiogram pending this admission.   Echo from April 2025 showed: LVEF <20%, G2 DD, severe global hypokinesis, normal RV systolic function, severe aortic regurg, normal IVC.  Last remote device check, from October 2025 showed: V sensed rhythm, no arrhythmias.  Upon my review with the patient, she agrees with the history as  stated above.  She says that typically around every 3 months she ends up getting volume overloaded for no apparent reason requiring IV diuresis.  She denies any changes in medications, including ability to take them, no other sick symptoms or known sick contacts.  She does note to me that she has had unintentional weight loss recently, this has led to her not taking her weight as regularly as  it was not reliable in regards to her volume status.  She then tells me she has been drinking 3 ensures daily in an attempt to increase her weight.  Past Medical History:  Diagnosis Date   Acute respiratory failure (HCC) 05/05/2018   Acute systolic congestive heart failure (HCC) 02/03/2018   AICD (automatic cardioverter/defibrillator) present    Allergy    Anxiety    Asthma    DM2 (diabetes mellitus, type 2) (HCC) 10/20/2020   Hypertension    PAF (paroxysmal atrial fibrillation) (HCC)    Presence of permanent cardiac pacemaker    Prophylactic measure 08/03/14-08/19/14   Prophyl. 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   LIMA to LAD, SVG to D1, SVG to OM1, EVH via right thigh with implantation of Impella LD LVAD via direct aortic approach   Small cell lung cancer (HCC) 03/16/2014   Past Surgical History:  Procedure Laterality Date   BRONCHIAL BRUSHINGS  10/25/2020   Procedure: BRONCHIAL BRUSHINGS;  Surgeon: Shelah Lamar RAMAN, MD;  Location: [facility] ENDOSCOPY;  Service: Pulmonary;;   BRONCHIAL NEEDLE ASPIRATION BIOPSY  10/25/2020   Procedure: BRONCHIAL NEEDLE ASPIRATION BIOPSIES;  Surgeon: Shelah Lamar RAMAN, MD;  Location: [facility] ENDOSCOPY;  Service: Pulmonary;;   CARDIAC DEFIBRILLATOR PLACEMENT  08/15/2018   MDT Visia AF MRI VR ICD implanted by Dr Zulma for primary prevention of sudden   CESAREAN SECTION     CORONARY ARTERY BYPASS GRAFT N/A 02/03/2018   Procedure: CORONARY ARTERY BYPASS GRAFTING (CABG);  Surgeon: Dusty Sudie DEL, MD;  Location: [facility] OR;  Service: Open Heart Surgery;  Laterality: N/A;  Time 3 using left internal mammary artery and endoscopically harvested right saphenous vein   CORONARY BALLOON ANGIOPLASTY N/A 02/03/2018   Procedure: CORONARY BALLOON ANGIOPLASTY;  Surgeon: Jordan, Peter M, MD;  Location: [facility] INVASIVE CV LAB;  Service: Cardiovascular;  Laterality: N/A;   CORONARY STENT INTERVENTION N/A 02/12/2019   Procedure: CORONARY STENT INTERVENTION;  Surgeon:  Darron Deatrice LABOR, MD;  Location: [facility] INVASIVE CV LAB;  Service: Cardiovascular;  Laterality: N/A;   CORONARY/GRAFT ACUTE MI REVASCULARIZATION N/A 02/03/2018   Procedure: Coronary/Graft Acute MI Revascularization;  Surgeon: Jordan, Peter M, MD;  Location: [facility] INVASIVE CV LAB;  Service: Cardiovascular;  Laterality: N/A;   ENDOBRONCHIAL ULTRASOUND N/A 10/25/2020   Procedure: ENDOBRONCHIAL ULTRASOUND;  Surgeon: Shelah Lamar RAMAN, MD;  Location: [facility] ENDOSCOPY;  Service: Pulmonary;  Laterality: N/A;   FLEXIBLE BRONCHOSCOPY  10/25/2020   Procedure: FLEXIBLE BRONCHOSCOPY;  Surgeon: Shelah Lamar RAMAN, MD;  Location: [facility] ENDOSCOPY;  Service: Pulmonary;;   IABP INSERTION N/A 02/03/2018   Procedure: IABP Insertion;  Surgeon: Jordan, Peter M, MD;  Location: [facility] INVASIVE CV LAB;  Service: Cardiovascular;  Laterality: N/A;   INTRAOPERATIVE TRANSESOPHAGEAL ECHOCARDIOGRAM N/A 02/03/2018   Procedure: INTRAOPERATIVE TRANSESOPHAGEAL ECHOCARDIOGRAM;  Surgeon: Dusty Sudie DEL, MD;  Location: [facility] OR;  Service: Open Heart Surgery;  Laterality: N/A;   LEFT HEART CATH AND CORONARY ANGIOGRAPHY N/A 02/03/2018   Procedure: LEFT HEART CATH AND CORONARY ANGIOGRAPHY;  Surgeon: Jordan, Peter M, MD;  Location: [facility] INVASIVE CV  LAB;  Service: Cardiovascular;  Laterality: N/A;   LEFT HEART CATH AND CORS/GRAFTS ANGIOGRAPHY N/A 02/12/2019   Procedure: LEFT HEART CATH AND CORS/GRAFTS ANGIOGRAPHY;  Surgeon: Darron Deatrice LABOR, MD;  Location: [facility] INVASIVE CV LAB;  Service: Cardiovascular;  Laterality: N/A;   MEDIASTINOSCOPY N/A 03/11/2014   Procedure: MEDIASTINOSCOPY;  Surgeon: Elspeth JAYSON Millers, MD;  Location: [facility] OR;  Service: Thoracic;  Laterality: N/A;   PLACEMENT OF IMPELLA LEFT VENTRICULAR ASSIST DEVICE  02/03/2018   Procedure: PLACEMENT OF IMPELLA LEFT VENTRICULAR ASSIST DEVICE LD;  Surgeon: Dusty Sudie DEL, MD;  Location: [facility] OR;  Service: Open Heart Surgery;;   REMOVAL OF IMPELLA LEFT VENTRICULAR ASSIST DEVICE N/A 02/08/2018   Procedure: REMOVAL OF  IMPELLA LEFT VENTRICULAR ASSIST DEVICE;  Surgeon: Dusty Sudie DEL, MD;  Location: [facility] OR;  Service: Open Heart Surgery;  Laterality: N/A;   RIGHT HEART CATH N/A 02/03/2018   Procedure: RIGHT HEART CATH;  Surgeon: Jordan, Peter M, MD;  Location: [facility] INVASIVE CV LAB;  Service: Cardiovascular;  Laterality: N/A;   RIGHT HEART CATH N/A 05/09/2018   Procedure: RIGHT HEART CATH;  Surgeon: Cherrie Toribio SAUNDERS, MD;  Location: [facility] INVASIVE CV LAB;  Service: Cardiovascular;  Laterality: N/A;   TEE WITHOUT CARDIOVERSION N/A 02/08/2018   Procedure: TRANSESOPHAGEAL ECHOCARDIOGRAM (TEE);  Surgeon: Dusty Sudie DEL, MD;  Location: [facility] OR;  Service: Open Heart Surgery;  Laterality: N/A;   TUBAL LIGATION     VIDEO BRONCHOSCOPY WITH ENDOBRONCHIAL ULTRASOUND N/A 03/11/2014   Procedure: VIDEO BRONCHOSCOPY WITH ENDOBRONCHIAL ULTRASOUND;  Surgeon: Elspeth JAYSON Millers, MD;  Location: [facility] OR;  Service: Thoracic;  Laterality: N/A;    Home Medications:  Prior to Admission medications  Medication Sig Start Date End Date Taking? Authorizing Provider  albuterol  (VENTOLIN  HFA) 108 (90 Base) MCG/ACT inhaler Inhale 2 puffs into the lungs every 6 (six) hours as needed for wheezing or shortness of breath. 06/20/22  Yes Fairy Frames, MD  atorvastatin  (LIPITOR ) 80 MG tablet Take 1 tablet (80 mg total) by mouth daily. 10/22/24 01/20/25 Yes [name], Harlene HERO, FNP  dapagliflozin  propanediol (FARXIGA ) 10 MG TABS tablet Take 1 tablet (10 mg total) by mouth daily. 10/22/24  Yes [name], Harlene HERO, FNP  Digoxin  62.5 MCG TABS Take 0.0625 mg by mouth daily. 11/17/24  Yes Colletta Manuelita Garre, PA-C  Dihydroxyaluminum Sod Carb (ROLAIDS PO) Take 1 tablet by mouth as needed.   Yes [provider]  ELIQUIS  2.5 MG TABS tablet TAKE 1 TABLET BY MOUTH TWICE A DAY 11/11/24  Yes [name], [place], FNP  Furosemide  (FUROSCIX ) 80 MG/10ML CTKT Inject 80 mg into the skin daily as needed (fluid and edema). Take as directed by the heart failure clinic only  10/22/24  Yes [name], [place], FNP  hydrOXYzine  (VISTARIL ) 25 MG capsule TAKE 1 CAPSULE (25 MG TOTAL) BY MOUTH DAILY AS NEEDED. 07/30/23  Yes Jaycee, Amy J, NP  ipratropium-albuterol  (DUONEB) 0.5-2.5 (3) MG/3ML SOLN TAKE 3 MLS BY NEBULIZATION EVERY 4 (FOUR) HOURS AS NEEDED (FOR SHORTNESS OF BREATH). 10/03/24  Yes Tanda Bleacher, MD  ivabradine  (CORLANOR ) 7.5 MG TABS tablet Take 1 tablet (7.5 mg total) by mouth 2 (two) times daily with a meal. 10/22/24  Yes [name], Harlene HERO, FNP  Multiple Vitamin (MULTIVITAMIN WITH MINERALS) TABS tablet Take 1 tablet by mouth daily. Gummies   Yes [provider]  Nutritional Supplements (ENSURE ORIGINAL) LIQD Take 1 Bottle by mouth in the morning, at noon, in the evening, and at bedtime.   Yes [provider]  pantoprazole  (PROTONIX ) 40 MG tablet Take 1 tablet (40 mg total) by mouth daily at 6 (six) AM. 07/21/24  Yes Cherlyn Labella, MD  potassium chloride  (KLOR-CON ) 20 MEQ packet Take 80 mEq by mouth 2 (two) times daily. TAKE 80 MEQ TOTAL TWICE A DAY EXCEPT ON METOLAZONE  DAYS OF TUESDAYS TAKE (100 MEQ) 3 TIMES PER DAY. Patient taking differently: Take 80 mEq by mouth 2 (two) times daily. TAKE 80 MEQ TOTAL TWICE A DAY 10/22/24  Yes [name], [place], FNP  spironolactone  (ALDACTONE ) 25 MG tablet Take 1 tablet (25 mg total) by mouth daily. 10/22/24  Yes [name], Harlene HERO, FNP  torsemide  (DEMADEX ) 100 MG tablet Take 1 tablet (100 mg total) by mouth 2 (two) times daily. 10/22/24  Yes [name], Harlene HERO, FNP  cyclobenzaprine  (FLEXERIL ) 10 MG tablet Take 1 tablet (10 mg total) by mouth 3 (three) times daily as needed for muscle spasms. Patient not taking: Reported on 12/26/2024 10/11/23   Tanda Bleacher, MD  megestrol  (MEGACE ) 20 MG tablet Take 1 tablet (20 mg total) by mouth 2 (two) times daily. Patient not taking: Reported on 12/26/2024 02/26/24   Tanda Bleacher, MD  metolazone  (ZAROXOLYN ) 5 MG tablet Take 1 tablet (5 mg total) by mouth as needed (as  instructed by Advanced HF clinic). Patient not taking: Reported on 12/26/2024 10/22/24   Glena Harlene HERO, FNP  nitroGLYCERIN  (NITROSTAT ) 0.4 MG SL tablet Place 1 tablet (0.4 mg total) under the tongue every 5 (five) minutes as needed for chest pain. Patient not taking: Reported on 12/26/2024 03/13/24   Lee, Jordan, NP   Scheduled Meds:  apixaban   2.5 mg Oral BID   atorvastatin   80 mg Oral Daily   benzonatate   100 mg Oral TID   dapagliflozin  propanediol  10 mg Oral Daily   digoxin   0.0625 mg Oral Daily   docusate sodium   100 mg Oral BID   insulin  aspart  0-5 Units Subcutaneous QHS   insulin  aspart  0-6 Units Subcutaneous TID WC   ipratropium-albuterol   3 mL Nebulization Q6H   ivabradine   7.5 mg Oral BID WC   methylPREDNISolone  (SOLU-MEDROL ) injection  40 mg Intravenous Daily   metolazone   5 mg Oral BID   pantoprazole   40 mg Oral Q0600   sodium chloride  flush  3 mL Intravenous Q12H   sodium chloride  flush  3 mL Intravenous Q12H   spironolactone   25 mg Oral Daily   Continuous Infusions:  sodium chloride      furosemide  (LASIX ) 200 mg in dextrose  5 % 100 mL (2 mg/mL) infusion 20 mg/hr (12/27/24 0527)   PRN Meds: sodium chloride , acetaminophen  **OR** acetaminophen , ipratropium-albuterol , sodium chloride  flush  Allergies:   Allergies[1]  Social History:   Social History   Socioeconomic History   Marital status: Married    Spouse name: Keven Washington     Number of children: 1   Years of education: Not on file   Highest education level: Not on file  Occupational History   Occupation: disabled  Tobacco Use   Smoking status: Former    Current packs/day: 0.00    Average packs/day: 1 pack/day for 20.0 years (20.0 ttl pk-yrs)    Types: Cigarettes    Start date: 03/13/1994    Quit date: 03/13/2014    Years since quitting: 10.8   Smokeless tobacco: Never  Vaping Use   Vaping status: Never Used  Substance and Sexual Activity   Alcohol use: Yes    Alcohol/week: 5.0 standard drinks  of alcohol  Types: 5 Glasses of wine per week    Comment: a few glasses of wine weekly   Drug use: Yes    Types: Marijuana    Comment: medicinal, not daily   Sexual activity: Never  Other Topics Concern   Not on file  Social History Narrative   Not on file   Social Drivers of Health   Tobacco Use: Medium Risk (12/26/2024)   Patient History    Smoking Tobacco Use: Former    Smokeless Tobacco Use: Never    Passive Exposure: Not on Actuary Strain: Not on file  Food Insecurity: No Food Insecurity (08/18/2024)   Epic    Worried About Programme Researcher, Broadcasting/film/video in the Last Year: Never true    Ran Out of Food in the Last Year: Never true  Transportation Needs: Unmet Transportation Needs (11/17/2024)   Epic    Lack of Transportation (Medical): Yes    Lack of Transportation (Non-Medical): No  Physical Activity: Insufficiently Active (08/14/2024)   Exercise Vital Sign    Days of Exercise per Week: 2 days    Minutes of Exercise per Session: 20 min  Stress: Not on file  Social Connections: Moderately Isolated (08/18/2024)   Social Connection and Isolation Panel    Frequency of Communication with Friends and Family: More than three times a week    Frequency of Social Gatherings with Friends and Family: Never    Attends Religious Services: Never    Database Administrator or Organizations: No    Attends Banker Meetings: Never    Marital Status: Married  Catering Manager Violence: Not At Risk (08/18/2024)   Epic    Fear of Current or Ex-Partner: No    Emotionally Abused: No    Physically Abused: No    Sexually Abused: No  Depression (PHQ2-9): Low Risk (07/22/2024)   Depression (PHQ2-9)    PHQ-2 Score: 0  Alcohol Screen: Low Risk (08/14/2024)   Alcohol Screen    Last Alcohol Screening Score (AUDIT): 3  Housing: Low Risk (08/18/2024)   Epic    Unable to Pay for Housing in the Last Year: No    Number of Times Moved in the Last Year: 0    Homeless in the Last Year:  No  Utilities: Not At Risk (08/18/2024)   Epic    Threatened with loss of utilities: No  Health Literacy: Not on file    Family History:   Family History  Problem Relation Age of Onset   Heart disease Mother    Hypertension Mother    Heart attack Mother    Hypertension Maternal Grandmother    Cancer Maternal Grandmother    Diabetes Paternal Grandmother    Stroke Neg Hx     ROS:  Please see the history of present illness.  All other ROS reviewed and negative.     Physical Exam/Data: Vitals:   12/27/24 0830 12/27/24 1130 12/27/24 1145 12/27/24 1328  BP: 116/69  120/80 117/73  Pulse: 86 88 87 85  Resp: 19  20 18   Temp:    (!) 97.4 F (36.3 C)  TempSrc:    Oral  SpO2: 100%  100% 100%  Weight:      Height:        Intake/Output Summary (Last 24 hours) at 12/27/2024 1405 Last data filed at 12/27/2024 0114 Gross per 24 hour  Intake 139 ml  Output --  Net 139 ml      12/26/2024  7:55 PM 11/17/2024    3:35 PM 10/22/2024    2:52 PM  Last 3 Weights  Weight (lbs) 87 lb 86 lb 6.4 oz 89 lb  Weight (kg) 39.463 kg 39.191 kg 40.37 kg     Body mass index is 17.57 kg/m.   General: Frail, chronically ill-appearing, in no acute distress, on 2 L oxygen via [place] HEENT: normal Vascular:  Distal pulses 2+ bilaterally Cardiac:  normal S1, S2; RRR; no murmur  Lungs: Expiratory wheezing noted, no crackles Abd: soft, nontender, no hepatomegaly  Ext: no edema Musculoskeletal:  No deformities Skin: warm and dry  Neuro:  no focal abnormalities noted Psych:  Normal affect   EKG:  The EKG was personally reviewed and demonstrates:  Barostim artifact   Telemetry:  Telemetry was personally reviewed and demonstrates:  Barostim artifact, rates 70s  Relevant CV Studies:  Echocardiogram, 12/27/2024 Ordered, pending results  Echocardiogram, 04/02/2024 Left ventricular ejection fraction, by estimation, is < 20% . The left ventricle has severely decreased function. The left ventricle  demonstrates regional wall motion abnormalities ( see scoring diagram/ findings for description) . The left ventricular internal cavity size was severely dilated. Left ventricular diastolic parameters are consistent with Grade II diastolic dysfunction ( pseudonormalization) . Severe global hypokinesis. There is akinesis of the left ventricular, entire apical segment. There is akinesis of the left ventricular, mid- apical septal wall, inferior wall, lateral wall, anterior wall and inferolateral wall. There is akinesis of the left ventricular, basal- mid anteroseptal wall. The average left ventricular global longitudinal strain is - 2. 8 % . The global longitudinal strain is abnormal.  Right ventricular systolic function is normal. The right ventricular size is normal. Tricuspid regurgitation signal is inadequate for assessing PA pressure.  Left atrial size was mildly dilated.  The mitral valve is normal in structure. No evidence of mitral valve regurgitation. No evidence of mitral stenosis. The aortic valve is tricuspid. There is moderate calcification of the aortic valve. There is moderate thickening of the aortic valve. Aortic valve regurgitation is severe. Aortic valve sclerosis/ calcification is present, without any evidence of aortic stenosis. Aortic valve mean gradient measures 8. 0 mmHg. Aortic valve Vmax measures 1. 99 m/ s.  The inferior vena cava is normal in size with < 50% respiratory variability, suggesting right atrial pressure of 8 mmHg.  Left heart cath, 02/12/2019 Ost LAD to Prox LAD lesion is 100% stenosed. Ost Cx to Prox Cx lesion is 100% stenosed. Ost Ramus lesion is 99% stenosed. Post intervention, there is a 0% residual stenosis. A drug-eluting stent was successfully placed using a STENT RESOLUTE ONYX 2.25X15. SVG and is normal in caliber. The graft exhibits no disease. Ost 1st Diag lesion is 70% stenosed. SVG and is normal in caliber. Prox Graft lesion is 100% stenosed.   1.   Significant underlying two-vessel coronary artery disease with patent SVG to OM and SVG to diagonal which supplies the LAD territory.  Atretic LIMA to LAD given competitive flow from SVG to diagonal.  Severe ostial ramus artery stenosis not supplied by bypass with his area suggestive of plaque rupture. 2.  Severely elevated left ventricular end-diastolic pressure 34 mmHg.  Left ventricular angiography was not performed. 3.  Successful angioplasty and drug-eluting stent placement to the ostial ramus extending into the distal left main.   Recommendations: Dual antiplatelet therapy for at least 1 year. The patient is volume overloaded and she has already received 1 dose of IV furosemide  before cardiac catheterization with good urine  output.  I am not going to diurese further today to avoid risk of contrast-induced nephropathy.  She will require resumption of diuresis tomorrow and optimization of her heart failure medications. The patient might be ischemic in the LAD territory given the presence of ostial diagonal stenosis which might affect retrograde flow into the LAD.  However, this is not amenable for PCI.  Also, it is not entirely clear if that area is viable.  Probably best to treat that medically  Laboratory Data: High Sensitivity Troponin:  No results for input(s): TROPONINIHS in the last 720 hours.  Recent Labs  Lab 12/26/24 2003 12/26/24 2229  TRNPT 44* 46*      Chemistry Recent Labs  Lab 12/26/24 2003 12/26/24 2038 12/27/24 0447  NA 137 139 134*  K 4.1 3.8 4.3  CL 100  --  99  CO2 22  --  19*  GLUCOSE 148*  --  288*  BUN 34*  --  39*  CREATININE 1.07*  --  1.25*  CALCIUM  10.0  --  10.0  GFRNONAA 58*  --  48*  ANIONGAP 14  --  16*    Recent Labs  Lab 12/26/24 2003 12/27/24 0447  PROT 7.1 7.2  ALBUMIN  4.2 4.5  AST 36 32  ALT 29 28  ALKPHOS 93 85  BILITOT 0.8 0.9   Lipids No results for input(s): CHOL, TRIG, HDL, LABVLDL, LDLCALC, CHOLHDL in the last 168  hours.  Hematology Recent Labs  Lab 12/26/24 2003 12/26/24 2038 12/27/24 0447  WBC 7.5  --  7.5  RBC 5.06  --  4.84  HGB 15.0 15.6* 13.9  HCT 46.5* 46.0 44.3  MCV 91.9  --  91.5  MCH 29.6  --  28.7  MCHC 32.3  --  31.4  RDW 17.7*  --  17.1*  PLT 208  --  197   Thyroid  No results for input(s): TSH, FREET4 in the last 168 hours.  BNP Recent Labs  Lab 12/26/24 2003  PROBNP 12,902.0*    DDimer No results for input(s): DDIMER in the last 168 hours.  Radiology/Studies:  DG Chest Portable 1 View Result Date: 12/27/2024 EXAM: 1 VIEW(S) XRAY OF THE CHEST 12/27/2024 03:46:00 AM COMPARISON: 12/26/2024 CLINICAL HISTORY: Shortness of breath Shortness of breath Shortness of breath Shortness of breath Shortness of breath FINDINGS: LINES, TUBES AND DEVICES: Implanted stimulator device overlies right hemithorax extending into neck. Stable left subclavian AICD. LUNGS AND PLEURA: Mild perihilar interstitial edema, slightly increased. Small pleural effusions. No focal pulmonary opacity. No pneumothorax. HEART AND MEDIASTINUM: Stable cardiomegaly. Aortic atherosclerosis. CABG markers noted. Stable left subclavian AICD. BONES AND SOFT TISSUES: Sternotomy wires noted. Implanted stimulator device overlies right hemithorax extending into neck. No acute osseous abnormality. IMPRESSION: 1. Mild perihilar interstitial edema, slightly increased. 2. Small pleural effusions. Electronically signed by: Evalene Coho MD 12/27/2024 04:00 AM EST RP Workstation: [id number]   DG Chest Portable 1 View Result Date: 12/26/2024 CLINICAL DATA:  Shortness of breath EXAM: PORTABLE CHEST 1 VIEW COMPARISON:  08/18/2024 FINDINGS: Pain post sternotomy changes. Left-sided pacing device. Right-sided stimulator generator with ascending lead. Cardiomegaly. Small pleural effusions are suspected. No focal consolidation. Aortic atherosclerosis. IMPRESSION: Cardiomegaly with small pleural effusions. Electronically Signed   By: Luke Bun M.D.   On: 12/26/2024 20:11   Assessment and Plan:  Acute on chronic end-stage systolic heart failure s/p Medtronic ICD, barostim Home medications: Torsemide  100 mg BID + 80 mEq potassium BID, digoxin  0.0625 mg daily, Farxiga  10 mg daily, spironolactone   25 mg daily, ivabradine   7.5 mg BID Followed closely by advanced heart failure team as an outpatient EF 20 to 25% for the last 5 years Various members of hospitalizations over the last 2 years Intermittently requires Furoscix  for fluid removal  Not a candidate for advanced therapies due to severe lung disease, frailty Presented to the ER with progressively worsening shortness of breath proBNP 12,902, troponin 44 ? 46 CXR cardiomegaly, small pleural effusions Digoxin  level < 0.6 Reports recent unintended weight loss, resulting in consuming 3 ensures daily Patient has not been typically taking weight at home as it did not adequately reflect volume status recently Will have device interrogated while in the ER Currently on IV Lasix  drip at 20 mg/hr Continue to monitor strict I's and O's, daily weights, daily BMPs Suspect patient will need few days of IV diuresis to achieve euvolemia Currently on metolazone  5 mg BID Currently on Farxiga  10 mg daily Currently on digoxin  0.062 mg daily Currently on Corlanor  7.5 mg BID Currently on spironolactone  25 mg nightly  CAD s/p CABG x 2 2019, DES 2020 Hyperlipidemia Home meds: Lipitor  80 mg daily Denies any active or prior chest pain  Troponin mildly elevated and flat, consistent with CHF exacerbation No aspirin  use with chronic Eliquis  use Continue statin  Paroxysmal atrial fibrillation Known history of PAF Home meds: Eliquis  2.5 mg BID, ivabradine  7.5 mg BID Awaiting device interrogation,as above  Continue low-dose Eliquis  (age, renal function) Continue Corlanor  7.5 mg twice daily  Goals of care Patient presently DNR-limited  Previously has declined home health/palliative care  services Continues to present with multiple hospitalizations per year with volume overload   She remains living alone, does not wish changes at this time Suspect she should have a conversation with her sister, niece, who are typically involved with her care, defer to primary   Per primary COPD Unintentional weight loss Chronic respiratory failure History of lung cancer CKD stage IIIb Type 2 diabetes History of renal infarction Lactic acidosis   Risk Assessment/Risk Scores:      [place]  Heart Association (NYHA) Functional Class NYHA Class IV  CHA2DS2-VASc Score = 5   This indicates a 7.2% annual risk of stroke. The patient's score is based upon: CHF History: 1 HTN History: 1 Diabetes History: 1 Stroke History: 0 Vascular Disease History: 1 Age Score: 0 Gender Score: 1      For questions or updates, please contact [facility] Please consult [email or web address] for contact info under    Signed, Waddell DELENA Donath, PA-C  12/27/2024 2:05 PM     [1]  Allergies Allergen Reactions   Sulfa Antibiotics Hives    Patient requested to have this undeleted, had this reaction about 5 years ago.   Lactose Intolerance (Gi) Diarrhea   Codeine Nausea And Vomiting   "

## 2024-12-27 NOTE — Progress Notes (Signed)
 " PROGRESS NOTE    Brittany [name]   FMW:[id number] DOB: 09-21-1961 DOA: 12/26/2024 PCP: Tanda Bleacher, MD  63/F, frail with end-stage chronic systolic CHF, EF 79%, [employer] ICD, chronic hypoxic respiratory failure, CABG, paroxysmal A-fib, CKD 3B, COPD, prior history of lung cancer and type II DM, presented to the ED overnight with worsening shortness of breath and cough, when EMS arrived she was hypoxic in the low 70s on 3 L O2 with respiratory distress, placed on BiPAP and transported to the ED. - Tachypneic, hypoxic, proBNP 12.9 K, creatinine 1.07, troponin 44, WBC 7, hemoglobin 15, VBG without significant hypercarbia, chest x-ray with interstitial edema and pleural effusions -Intubation was considered since she is DNR, TRH was consulted for admission  Subjective: -Patient seen, just coming off BiPAP, reports being short of breath, feels better compared to yesterday  Assessment and Plan:  Acute on chronic systolic CHF, BiV failure End-stage heart failure - Now admitted with severe respiratory distress  - Followed by advanced heart failure clinic, EF has been 20-25% for approximately 5 years, - Last echo 1/25 noted EF less than 20% with moderate to severely reduced RV  - Frequent hospitalizations on high-dose torsemide  at baseline, reports compliance - Now on Lasix  gtt. with limited response, metolazone  ordered today - GDMT has been limited by chronic hypotension - BP more stable now, on digoxin , ivabradine , Farxiga , Aldactone  - Cards consult pending, check lactate - May need palliative care eval this admission   Acute on chronic hypoxic respiratory failure-secondary to CHF COPD/chronic respiratory failure on 2 L O2 at baseline -Has underlying advanced COPD as well, respiratory virus panel is negative -Continue DuoNebs, DC azithromycin  -Doubt she needs steroids but will continue for today   Elevated troponin-secondary to demand ischemia - Type II MI from demand in the  setting of CHF   Paroxysmal atrial fibrillation -Continue Eliquis  2.5 mg twice daily   History of CAD status post CABG x 2 - Continue Eliquis  and statin.   History of lung cancer status post chemotherapy treatment completed 2015   CKD stage IIIb -Stable renal function continue to monitor   Non-insulin -dependent DM type II -Continue Farxiga  and sliding scale insulin  and mealtime coverage.   GERD - Continue Protonix .     History of renal infraction - Continue Eliquis      DVT prophylaxis:  Eliquis , SCD TED hose  Code Status:  DNR/DNI Diet: Heart healthy carb modified diet with fluid restriction 2 L/day and salt restriction 2 g/day Family Communication: No family at bedside now Disposition Plan:   Consultants:    Procedures:   Antimicrobials:    Objective: Vitals:   12/27/24 0530 12/27/24 0744 12/27/24 0815 12/27/24 0830  BP:    116/69  Pulse: 83  83 86  Resp: 15 20  19   Temp: 98.3 F (36.8 C)     TempSrc: Oral     SpO2: 100%  100% 100%  Weight:      Height:        Intake/Output Summary (Last 24 hours) at 12/27/2024 1053 Last data filed at 12/27/2024 0114 Gross per 24 hour  Intake 139 ml  Output --  Net 139 ml   Filed Weights   12/26/24 1955  Weight: 39.5 kg    Examination:  General exam: Frail chronically ill cachectic appears calm and comfortable  Respiratory system: Clear to auscultation Cardiovascular system: S1 & S2 heard, RRR.  Abd: nondistended, soft and nontender.Normal bowel sounds heard. Central nervous system: Alert and oriented. No focal neurological deficits.  Extremities: no edema Skin: No rashes Psychiatry:  Mood & affect appropriate.     Data Reviewed:   CBC: Recent Labs  Lab 12/26/24 2003 12/26/24 2038 12/27/24 0447  WBC 7.5  --  7.5  NEUTROABS 5.6  --   --   HGB 15.0 15.6* 13.9  HCT 46.5* 46.0 44.3  MCV 91.9  --  91.5  PLT 208  --  197   Basic Metabolic Panel: Recent Labs  Lab 12/26/24 2003 12/26/24 2038  12/27/24 0447  NA 137 139 134*  K 4.1 3.8 4.3  CL 100  --  99  CO2 22  --  19*  GLUCOSE 148*  --  288*  BUN 34*  --  39*  CREATININE 1.07*  --  1.25*  CALCIUM  10.0  --  10.0   GFR: Estimated Creatinine Clearance: 28.4 mL/min (A) (by C-G formula based on SCr of 1.25 mg/dL (H)). Liver Function Tests: Recent Labs  Lab 12/26/24 2003 12/27/24 0447  AST 36 32  ALT 29 28  ALKPHOS 93 85  BILITOT 0.8 0.9  PROT 7.1 7.2  ALBUMIN  4.2 4.5   No results for input(s): LIPASE, AMYLASE in the last 168 hours. No results for input(s): AMMONIA in the last 168 hours. Coagulation Profile: No results for input(s): INR, PROTIME in the last 168 hours. Cardiac Enzymes: No results for input(s): CKTOTAL, CKMB, CKMBINDEX, TROPONINI in the last 168 hours. BNP (last 3 results) Recent Labs    12/26/24 2003  PROBNP 12,902.0*   HbA1C: No results for input(s): HGBA1C in the last 72 hours. CBG: Recent Labs  Lab 12/26/24 2232 12/27/24 0839  GLUCAP 143* 254*   Lipid Profile: No results for input(s): CHOL, HDL, LDLCALC, TRIG, CHOLHDL, LDLDIRECT in the last 72 hours. Thyroid  Function Tests: No results for input(s): TSH, T4TOTAL, FREET4, T3FREE, THYROIDAB in the last 72 hours. Anemia Panel: No results for input(s): VITAMINB12, FOLATE, FERRITIN, TIBC, IRON, RETICCTPCT in the last 72 hours. Urine analysis:    Component Value Date/Time   COLORURINE YELLOW 07/17/2024 1500   APPEARANCEUR CLEAR 07/17/2024 1500   LABSPEC 1.007 07/17/2024 1500   PHURINE 6.0 07/17/2024 1500   GLUCOSEU >=500 (A) 07/17/2024 1500   HGBUR NEGATIVE 07/17/2024 1500   BILIRUBINUR NEGATIVE 07/17/2024 1500   KETONESUR NEGATIVE 07/17/2024 1500   PROTEINUR NEGATIVE 07/17/2024 1500   UROBILINOGEN 1.0 03/15/2015 1117   NITRITE NEGATIVE 07/17/2024 1500   LEUKOCYTESUR NEGATIVE 07/17/2024 1500   Sepsis Labs: @LABRCNTIP (procalcitonin:4,lacticidven:4)  ) Recent Results (from  the past 240 hours)  Respiratory (~20 pathogens) panel by PCR     Status: None   Collection Time: 12/26/24 10:29 PM   Specimen: Nasopharyngeal Swab; Respiratory  Result Value Ref Range Status   Adenovirus NOT DETECTED NOT DETECTED Final   Coronavirus 229E NOT DETECTED NOT DETECTED Final    Comment: (NOTE) The Coronavirus on the Respiratory Panel, DOES NOT test for the novel  Coronavirus (2019 nCoV)    Coronavirus HKU1 NOT DETECTED NOT DETECTED Final   Coronavirus NL63 NOT DETECTED NOT DETECTED Final   Coronavirus OC43 NOT DETECTED NOT DETECTED Final   Metapneumovirus NOT DETECTED NOT DETECTED Final   Rhinovirus / Enterovirus NOT DETECTED NOT DETECTED Final   Influenza A NOT DETECTED NOT DETECTED Final   Influenza B NOT DETECTED NOT DETECTED Final   Parainfluenza Virus 1 NOT DETECTED NOT DETECTED Final   Parainfluenza Virus 2 NOT DETECTED NOT DETECTED Final   Parainfluenza Virus 3 NOT DETECTED NOT DETECTED Final   Parainfluenza  Virus 4 NOT DETECTED NOT DETECTED Final   Respiratory Syncytial Virus NOT DETECTED NOT DETECTED Final   Bordetella pertussis NOT DETECTED NOT DETECTED Final   Bordetella Parapertussis NOT DETECTED NOT DETECTED Final   Chlamydophila pneumoniae NOT DETECTED NOT DETECTED Final   Mycoplasma pneumoniae NOT DETECTED NOT DETECTED Final    Comment: Performed at [facility], [address], [place], [place] [address]     Radiology Studies: DG Chest Portable 1 View Result Date: 12/27/2024 EXAM: 1 VIEW(S) XRAY OF THE CHEST 12/27/2024 03:46:00 AM COMPARISON: 12/26/2024 CLINICAL HISTORY: Shortness of breath Shortness of breath Shortness of breath Shortness of breath Shortness of breath FINDINGS: LINES, TUBES AND DEVICES: Implanted stimulator device overlies right hemithorax extending into neck. Stable left subclavian AICD. LUNGS AND PLEURA: Mild perihilar interstitial edema, slightly increased. Small pleural effusions. No focal pulmonary opacity. No pneumothorax.  HEART AND MEDIASTINUM: Stable cardiomegaly. Aortic atherosclerosis. CABG markers noted. Stable left subclavian AICD. BONES AND SOFT TISSUES: Sternotomy wires noted. Implanted stimulator device overlies right hemithorax extending into neck. No acute osseous abnormality. IMPRESSION: 1. Mild perihilar interstitial edema, slightly increased. 2. Small pleural effusions. Electronically signed by: Evalene Coho MD 12/27/2024 04:00 AM EST RP Workstation: [id number]   DG Chest Portable 1 View Result Date: 12/26/2024 CLINICAL DATA:  Shortness of breath EXAM: PORTABLE CHEST 1 VIEW COMPARISON:  08/18/2024 FINDINGS: Pain post sternotomy changes. Left-sided pacing device. Right-sided stimulator generator with ascending lead. Cardiomegaly. Small pleural effusions are suspected. No focal consolidation. Aortic atherosclerosis. IMPRESSION: Cardiomegaly with small pleural effusions. Electronically Signed   By: Luke Bun M.D.   On: 12/26/2024 20:11     Scheduled Meds:  apixaban   2.5 mg Oral BID   atorvastatin   80 mg Oral Daily   azithromycin   500 mg Oral Daily   benzonatate   100 mg Oral TID   dapagliflozin  propanediol  10 mg Oral Daily   digoxin   0.0625 mg Oral Daily   docusate sodium   100 mg Oral BID   insulin  aspart  0-5 Units Subcutaneous QHS   insulin  aspart  0-6 Units Subcutaneous TID WC   ipratropium-albuterol   3 mL Nebulization Q6H   ivabradine   7.5 mg Oral BID WC   methylPREDNISolone  (SOLU-MEDROL ) injection  40 mg Intravenous Daily   metolazone   5 mg Oral BID   pantoprazole   40 mg Oral Q0600   sodium chloride  flush  3 mL Intravenous Q12H   sodium chloride  flush  3 mL Intravenous Q12H   spironolactone   25 mg Oral Daily   Continuous Infusions:  sodium chloride      furosemide  (LASIX ) 200 mg in dextrose  5 % 100 mL (2 mg/mL) infusion 20 mg/hr (12/27/24 0527)     LOS: 1 day    Time spent:    Sigurd Pac, MD [facility]   12/27/2024, 10:53 AM    "

## 2024-12-27 NOTE — ED Provider Notes (Addendum)
 Patient was emergently transferred back to the emergency department from the boarding area. Per chart review was admitted for a heart failure/COPD combined exacerbation and had been tolerating BiPAP through the evening before 30 minutes ago ripping off her BiPAP saying that she does not want to wear it anymore.  Gradually became obtunded over the past 15 minutes. Respiratory at bedside providing respiratory interventions by time of patient transfer back to the acute area.  Hospitalist engaged ICU.  Patient has a DNR DNI documented in her chart across multiple visits.  Discussed with patient that I am worried that given intolerance of BiPAP, patient would very likely expired tonight if she does not either go back on the BiPAP or be intubated.  Patient was able to clearly state in front of me, bedside nurse, multiple other supporting staff members at bedside that she does not want to go on a ventilator even if it meant she would die without it. This is consistent with her prior wishes based on her other medical problems. Will continue aggressive medical resuscitation.  She seems to be able to tolerate the BiPAP at the moment on Ativan .  Worry about decreasing her respiratory drive's further.  Could trial ketamine  if she does not tolerate BiPAP again tonight.  Regardless, patient is going to require very close management in the ICU this evening for maximal medical therapy.  CRITICAL CARE Performed by: Cyndee Dada   Total critical care time: 45 minutes  Critical care time was exclusive of separately billable procedures and treating other patients.  Critical care was necessary to treat or prevent imminent or life-threatening deterioration.  Critical care was time spent personally by me on the following activities: development of treatment plan with patient and/or surrogate as well as nursing, discussions with consultants, evaluation of patient's response to treatment, examination of patient,  obtaining history from patient or surrogate, ordering and performing treatments and interventions, ordering and review of laboratory studies, ordering and review of radiographic studies, pulse oximetry and re-evaluation of patient's condition.   Dada Cyndee, MD 12/27/24 BRYN    Dada Cyndee, MD 12/27/24 [phone]

## 2024-12-27 NOTE — Progress Notes (Signed)
 Patient off the BIPAP at this time and placed on 4L [place], tolerating well, VSS. BIPAP on standby at bedside.

## 2024-12-27 NOTE — Progress Notes (Signed)
 RT called to assess patient, patient stating I feel like I'm suffocating, take this off as she was pulling on the BiPAP mask. RT removed as patient wished. Placed back on 4L [place]. BiPAP on standby.

## 2024-12-27 NOTE — Progress Notes (Signed)
" °   12/27/24 0121  BiPAP/CPAP/SIPAP  $ Non-Invasive Ventilator  Non-Invasive Vent Subsequent  BiPAP/CPAP/SIPAP Pt Type Adult  BiPAP/CPAP/SIPAP SERVO  Mask Type Full face mask  Dentures removed? Not applicable  Mask Size Small  Set Rate 20 breaths/min  Respiratory Rate 23 breaths/min  IPAP 24 cmH20  EPAP 8 cmH2O  Pressure Support 16 cmH20  PEEP 8 cmH20  FiO2 (%) 60 %  Flow Rate 0 lpm  Minute Ventilation 11.9  Leak 13  Peak Inspiratory Pressure (PIP) 24  Tidal Volume (Vt) 520  Patient Home Machine No  Patient Home Mask No  Patient Home Tubing No  Auto Titrate No  Press High Alarm 35 cmH2O  Device Plugged into RED Power Outlet Yes    "

## 2024-12-27 NOTE — Progress Notes (Signed)
" °  Admitted from Emergency Department to Room 3E01. Placed on cardiac monitor.  CCMD notified, vitals and weight obtained.    12/27/24 1742  Vitals  Temp 97.6 F (36.4 C)  Temp Source Oral  BP 120/81  MAP (mmHg) 94  BP Location Left Arm  BP Method Automatic  Patient Position (if appropriate) Lying  Pulse Rate 97  Pulse Rate Source Monitor  ECG Heart Rate 97  Resp (!) 21  Level of Consciousness  Level of Consciousness Alert  MEWS COLOR  MEWS Score Color Green  Oxygen Therapy  SpO2 100 %  O2 Device Nasal Cannula  O2 Flow Rate (L/min) 2 L/min  Pain Assessment  Pain Scale 0-10  Pain Score 0  Height and Weight  Height 4' 11 (1.499 m)  Weight 37.9 kg  Type of Scale Used Standing  Type of Weight Actual  BSA (Calculated - sq m) 1.26 sq meters  BMI (Calculated) 16.87  Weight in (lb) to have BMI = 25 123.5  ECG Monitoring  Telemetry Box Number [id number] Hardwired  Tele Box Verification Completed by Second Verifier Completed  MEWS Score  MEWS Temp 0  MEWS Systolic 0  MEWS Pulse 0  MEWS RR 1  MEWS LOC 0  MEWS Score 1    "

## 2024-12-27 NOTE — ED Notes (Signed)
 Pt called out requesting RT, RN went to assess pt, found pt in bed, tripoding with BIPAP on, RT called, MD made aware and ordered ativan , ativan  given, then pt refused BIPAP and continued to state I can't breathe, pt became more dyspneic and cyanotic, pt moved to trauma room d/t worsening condition, ER doctor and admitting MD at bedside

## 2024-12-28 ENCOUNTER — Other Ambulatory Visit: Payer: Self-pay

## 2024-12-28 ENCOUNTER — Inpatient Hospital Stay (HOSPITAL_COMMUNITY)

## 2024-12-28 DIAGNOSIS — E872 Acidosis, unspecified: Secondary | ICD-10-CM

## 2024-12-28 DIAGNOSIS — I5021 Acute systolic (congestive) heart failure: Secondary | ICD-10-CM | POA: Diagnosis not present

## 2024-12-28 DIAGNOSIS — I130 Hypertensive heart and chronic kidney disease with heart failure and stage 1 through stage 4 chronic kidney disease, or unspecified chronic kidney disease: Secondary | ICD-10-CM

## 2024-12-28 DIAGNOSIS — I25810 Atherosclerosis of coronary artery bypass graft(s) without angina pectoris: Secondary | ICD-10-CM

## 2024-12-28 DIAGNOSIS — I255 Ischemic cardiomyopathy: Secondary | ICD-10-CM

## 2024-12-28 DIAGNOSIS — I5041 Acute combined systolic (congestive) and diastolic (congestive) heart failure: Secondary | ICD-10-CM | POA: Diagnosis not present

## 2024-12-28 DIAGNOSIS — I480 Paroxysmal atrial fibrillation: Secondary | ICD-10-CM

## 2024-12-28 DIAGNOSIS — Z7901 Long term (current) use of anticoagulants: Secondary | ICD-10-CM

## 2024-12-28 LAB — CBC
HCT: 44.8 % (ref 36.0–46.0)
Hemoglobin: 14.7 g/dL (ref 12.0–15.0)
MCH: 29.1 pg (ref 26.0–34.0)
MCHC: 32.8 g/dL (ref 30.0–36.0)
MCV: 88.5 fL (ref 80.0–100.0)
Platelets: 228 K/uL (ref 150–400)
RBC: 5.06 MIL/uL (ref 3.87–5.11)
RDW: 17.4 % — ABNORMAL HIGH (ref 11.5–15.5)
WBC: 9.3 K/uL (ref 4.0–10.5)
nRBC: 0 % (ref 0.0–0.2)

## 2024-12-28 LAB — ECHOCARDIOGRAM COMPLETE
AR max vel: 2.03 cm2
AV Area VTI: 1.88 cm2
AV Area mean vel: 1.68 cm2
AV Mean grad: 7 mmHg
AV Peak grad: 12.5 mmHg
Ao pk vel: 1.77 m/s
Area-P 1/2: 6.6 cm2
Calc EF: 8.4 %
Est EF: <20
Height: 59 in
P 1/2 time: 54 ms
S' Lateral: 6 cm
Single Plane A2C EF: 10.1 %
Single Plane A4C EF: 9.5 %
Weight: 1530.87 [oz_av]

## 2024-12-28 LAB — COOXEMETRY PANEL
Carboxyhemoglobin: 0.9 % (ref 0.5–1.5)
Methemoglobin: <0.7 % (ref 0.0–1.5)
O2 Saturation: 69.2 %
Total hemoglobin: 14.4 g/dL (ref 12.0–16.0)

## 2024-12-28 LAB — BASIC METABOLIC PANEL WITH GFR
Anion gap: 17 — ABNORMAL HIGH (ref 5–15)
BUN: 52 mg/dL — ABNORMAL HIGH (ref 8–23)
CO2: 27 mmol/L (ref 22–32)
Calcium: 10.3 mg/dL (ref 8.9–10.3)
Chloride: 95 mmol/L — ABNORMAL LOW (ref 98–111)
Creatinine, Ser: 1.49 mg/dL — ABNORMAL HIGH (ref 0.44–1.00)
GFR, Estimated: 39 mL/min — ABNORMAL LOW
Glucose, Bld: 87 mg/dL (ref 70–99)
Potassium: 3.3 mmol/L — ABNORMAL LOW (ref 3.5–5.1)
Sodium: 139 mmol/L (ref 135–145)

## 2024-12-28 LAB — GLUCOSE, CAPILLARY
Glucose-Capillary: 116 mg/dL — ABNORMAL HIGH (ref 70–99)
Glucose-Capillary: 174 mg/dL — ABNORMAL HIGH (ref 70–99)
Glucose-Capillary: 87 mg/dL (ref 70–99)
Glucose-Capillary: 163 mg/dL — ABNORMAL HIGH (ref 70–99)

## 2024-12-28 LAB — LACTIC ACID, PLASMA: Lactic Acid, Venous: 1.6 mmol/L (ref 0.5–1.9)

## 2024-12-28 MED ORDER — SODIUM CHLORIDE 0.9% FLUSH
10.0000 mL | Freq: Two times a day (BID) | INTRAVENOUS | Status: DC
  Administered 2024-12-29 – 2024-12-31 (×5): 10 mL

## 2024-12-28 MED ORDER — DOBUTAMINE-DEXTROSE 4-5 MG/ML-% IV SOLN
2.5000 ug/kg/min | INTRAVENOUS | Status: DC
  Administered 2024-12-28: 2.5 ug/kg/min via INTRAVENOUS
  Filled 2024-12-28: qty 250

## 2024-12-28 MED ORDER — PERFLUTREN LIPID MICROSPHERE
1.0000 mL | INTRAVENOUS | Status: AC | PRN
  Administered 2024-12-28: 2 mL via INTRAVENOUS

## 2024-12-28 MED ORDER — ACETAMINOPHEN 325 MG PO TABS: 650.0000 mg | ORAL_TABLET | Freq: Once | ORAL | Status: DC

## 2024-12-28 MED ORDER — MILRINONE LACTATE IN DEXTROSE 20-5 MG/100ML-% IV SOLN: 0.2500 ug/kg/min | INTRAVENOUS | Status: DC

## 2024-12-28 MED ORDER — SODIUM CHLORIDE 0.9% FLUSH: 10.0000 mL | INTRAVENOUS | Status: DC | PRN

## 2024-12-28 MED ORDER — CHLORHEXIDINE GLUCONATE CLOTH 2 % EX PADS
6.0000 | MEDICATED_PAD | Freq: Every day | CUTANEOUS | Status: DC
  Administered 2024-12-28 – 2024-12-31 (×4): 6 via TOPICAL

## 2024-12-28 MED ORDER — POTASSIUM CHLORIDE CRYS ER 20 MEQ PO TBCR
40.0000 meq | EXTENDED_RELEASE_TABLET | Freq: Two times a day (BID) | ORAL | Status: DC
  Filled 2024-12-28: qty 2

## 2024-12-28 MED ORDER — POTASSIUM CHLORIDE 20 MEQ PO PACK
40.0000 meq | PACK | Freq: Two times a day (BID) | ORAL | Status: AC
  Administered 2024-12-28 (×2): 40 meq via ORAL
  Filled 2024-12-28 (×2): qty 2

## 2024-12-28 MED ORDER — GUAIFENESIN-DM 100-10 MG/5ML PO SYRP
5.0000 mL | ORAL_SOLUTION | ORAL | Status: DC | PRN
  Administered 2024-12-28: 5 mL via ORAL
  Filled 2024-12-28: qty 5

## 2024-12-28 NOTE — Progress Notes (Signed)
 " PROGRESS NOTE    Brittany [name]   FMW:[id number] DOB: [birth year] DOA: 12/26/2024 PCP: Tanda Bleacher, MD  63/F, frail with end-stage chronic systolic CHF, EF 79%, [employer] ICD, chronic hypoxic respiratory failure, CABG, paroxysmal A-fib, CKD 3B, COPD, prior history of lung cancer and type II DM, presented to the ED overnight with worsening shortness of breath and cough, when EMS arrived she was hypoxic in the low 70s on 3 L O2 with respiratory distress, placed on BiPAP and transported to the ED. - Tachypneic, hypoxic, proBNP 12.9 K, creatinine 1.07, troponin 44, WBC 7, hemoglobin 15, VBG without significant hypercarbia, chest x-ray with interstitial edema and pleural effusions -Intubation was considered since she is DNR, TRH was consulted for admission  Subjective: - Still short of breath, marginally better  Assessment and Plan:  Acute on chronic systolic CHF, BiV failure End-stage heart failure Low output CHF - Followed by advanced heart failure clinic, EF has been 20-25% for approximately 5 years, - Last echo 1/25 noted EF less than 20% with moderate to severely reduced RV  - Frequent hospitalizations on high-dose torsemide  at baseline, reports compliance - Concern for low output, worsening lactic acidosis, cards following, PICC line just placed, Co. ox pending, dobutamine  to start today, continue Lasix  gtt.  - GDMT has been limited by chronic hypotension - on digoxin , ivabradine , Farxiga , Aldactone  - May need palliative care eval this admission   Acute on chronic hypoxic respiratory failure-secondary to CHF COPD/chronic respiratory failure on 2 L O2 at baseline -Has underlying advanced COPD as well, respiratory virus panel is negative -Continue DuoNebs, DC azithromycin  - Discontinue steroids   Elevated troponin-secondary to demand ischemia - Type II MI from demand in the setting of CHF   Paroxysmal atrial fibrillation -Continue Eliquis  2.5 mg twice daily   History of  CAD status post CABG x 2 - Continue Eliquis  and statin.   History of lung cancer status post chemotherapy treatment completed 2015   CKD stage IIIb -Stable renal function continue to monitor   Non-insulin -dependent DM type II -Continue Farxiga  and sliding scale insulin  and mealtime coverage.   GERD - Continue Protonix .     History of renal infraction - Continue Eliquis      DVT prophylaxis:  Eliquis , SCD TED hose  Code Status:  DNR/DNI Family Communication: No family at bedside now Disposition Plan: Home in improved  Consultants:    Procedures:   Antimicrobials:    Objective: Vitals:   12/27/24 1942 12/27/24 2248 12/28/24 0332 12/28/24 0715  BP:  103/62 (!) 97/53 (!) 97/55  Pulse:  93 91 94  Resp: (!) 31 19 20 20   Temp: 98.5 F (36.9 C) 97.7 F (36.5 C) 97.9 F (36.6 C) 97.9 F (36.6 C)  TempSrc: Oral Axillary Axillary Axillary  SpO2:  100%  100%  Weight:   43.4 kg   Height:        Intake/Output Summary (Last 24 hours) at 12/28/2024 1024 Last data filed at 12/28/2024 0334 Gross per 24 hour  Intake --  Output 2300 ml  Net -2300 ml   Filed Weights   12/26/24 1955 12/27/24 1742 12/28/24 0332  Weight: 39.5 kg 37.9 kg 43.4 kg    Examination:  General exam: Frail chronically ill cachectic, AO x 3 HEENT: Positive JVD Respiratory system: Decreased at the bases Cardiovascular system: S1 & S2 heard, RRR.  Abd: nondistended, soft and nontender.Normal bowel sounds heard. Central nervous system: Alert and oriented. No focal neurological deficits. Extremities: No edema Skin: No rashes Psychiatry:  Mood & affect appropriate.     Data Reviewed:   CBC: Recent Labs  Lab 12/26/24 2003 12/26/24 2038 12/27/24 0447 12/28/24 0239  WBC 7.5  --  7.5 9.3  NEUTROABS 5.6  --   --   --   HGB 15.0 15.6* 13.9 14.7  HCT 46.5* 46.0 44.3 44.8  MCV 91.9  --  91.5 88.5  PLT 208  --  197 228   Basic Metabolic Panel: Recent Labs  Lab 12/26/24 2003 12/26/24 2038  12/27/24 0447 12/28/24 0239  NA 137 139 134* 139  K 4.1 3.8 4.3 3.3*  CL 100  --  99 95*  CO2 22  --  19* 27  GLUCOSE 148*  --  288* 87  BUN 34*  --  39* 52*  CREATININE 1.07*  --  1.25* 1.49*  CALCIUM  10.0  --  10.0 10.3   GFR: Estimated Creatinine Clearance: 26 mL/min (A) (by C-G formula based on SCr of 1.49 mg/dL (H)). Liver Function Tests: Recent Labs  Lab 12/26/24 2003 12/27/24 0447  AST 36 32  ALT 29 28  ALKPHOS 93 85  BILITOT 0.8 0.9  PROT 7.1 7.2  ALBUMIN  4.2 4.5   No results for input(s): LIPASE, AMYLASE in the last 168 hours. No results for input(s): AMMONIA in the last 168 hours. Coagulation Profile: No results for input(s): INR, PROTIME in the last 168 hours. Cardiac Enzymes: No results for input(s): CKTOTAL, CKMB, CKMBINDEX, TROPONINI in the last 168 hours. BNP (last 3 results) Recent Labs    12/26/24 2003  PROBNP 12,902.0*   HbA1C: No results for input(s): HGBA1C in the last 72 hours. CBG: Recent Labs  Lab 12/26/24 2232 12/27/24 0839 12/27/24 1315 12/27/24 2106 12/28/24 0612  GLUCAP 143* 254* 167* 240* 116*   Lipid Profile: No results for input(s): CHOL, HDL, LDLCALC, TRIG, CHOLHDL, LDLDIRECT in the last 72 hours. Thyroid  Function Tests: No results for input(s): TSH, T4TOTAL, FREET4, T3FREE, THYROIDAB in the last 72 hours. Anemia Panel: No results for input(s): VITAMINB12, FOLATE, FERRITIN, TIBC, IRON, RETICCTPCT in the last 72 hours. Urine analysis:    Component Value Date/Time   COLORURINE YELLOW 07/17/2024 1500   APPEARANCEUR CLEAR 07/17/2024 1500   LABSPEC 1.007 07/17/2024 1500   PHURINE 6.0 07/17/2024 1500   GLUCOSEU >=500 (A) 07/17/2024 1500   HGBUR NEGATIVE 07/17/2024 1500   BILIRUBINUR NEGATIVE 07/17/2024 1500   KETONESUR NEGATIVE 07/17/2024 1500   PROTEINUR NEGATIVE 07/17/2024 1500   UROBILINOGEN 1.0 03/15/2015 1117   NITRITE NEGATIVE 07/17/2024 1500   LEUKOCYTESUR  NEGATIVE 07/17/2024 1500   Sepsis Labs: @LABRCNTIP (procalcitonin:4,lacticidven:4)  ) Recent Results (from the past 240 hours)  Respiratory (~20 pathogens) panel by PCR     Status: None   Collection Time: 12/26/24 10:29 PM   Specimen: Nasopharyngeal Swab; Respiratory  Result Value Ref Range Status   Adenovirus NOT DETECTED NOT DETECTED Final   Coronavirus 229E NOT DETECTED NOT DETECTED Final    Comment: (NOTE) The Coronavirus on the Respiratory Panel, DOES NOT test for the novel  Coronavirus (2019 nCoV)    Coronavirus HKU1 NOT DETECTED NOT DETECTED Final   Coronavirus NL63 NOT DETECTED NOT DETECTED Final   Coronavirus OC43 NOT DETECTED NOT DETECTED Final   Metapneumovirus NOT DETECTED NOT DETECTED Final   Rhinovirus / Enterovirus NOT DETECTED NOT DETECTED Final   Influenza A NOT DETECTED NOT DETECTED Final   Influenza B NOT DETECTED NOT DETECTED Final   Parainfluenza Virus 1 NOT DETECTED NOT DETECTED Final  Parainfluenza Virus 2 NOT DETECTED NOT DETECTED Final   Parainfluenza Virus 3 NOT DETECTED NOT DETECTED Final   Parainfluenza Virus 4 NOT DETECTED NOT DETECTED Final   Respiratory Syncytial Virus NOT DETECTED NOT DETECTED Final   Bordetella pertussis NOT DETECTED NOT DETECTED Final   Bordetella Parapertussis NOT DETECTED NOT DETECTED Final   Chlamydophila pneumoniae NOT DETECTED NOT DETECTED Final   Mycoplasma pneumoniae NOT DETECTED NOT DETECTED Final    Comment: Performed at [facility], [address], [place], [place] [address]     Radiology Studies: US  EKG SITE RITE Result Date: 12/28/2024 If Site Rite image not attached, placement could not be confirmed due to current cardiac rhythm.  US  EKG SITE RITE Result Date: 12/28/2024 If Site Rite image not attached, placement could not be confirmed due to current cardiac rhythm.  DG Chest Portable 1 View Result Date: 12/27/2024 EXAM: 1 VIEW(S) XRAY OF THE CHEST 12/27/2024 03:46:00 AM COMPARISON: 12/26/2024 CLINICAL  HISTORY: Shortness of breath Shortness of breath Shortness of breath Shortness of breath Shortness of breath FINDINGS: LINES, TUBES AND DEVICES: Implanted stimulator device overlies right hemithorax extending into neck. Stable left subclavian AICD. LUNGS AND PLEURA: Mild perihilar interstitial edema, slightly increased. Small pleural effusions. No focal pulmonary opacity. No pneumothorax. HEART AND MEDIASTINUM: Stable cardiomegaly. Aortic atherosclerosis. CABG markers noted. Stable left subclavian AICD. BONES AND SOFT TISSUES: Sternotomy wires noted. Implanted stimulator device overlies right hemithorax extending into neck. No acute osseous abnormality. IMPRESSION: 1. Mild perihilar interstitial edema, slightly increased. 2. Small pleural effusions. Electronically signed by: Evalene Coho MD 12/27/2024 04:00 AM EST RP Workstation: [id number]   DG Chest Portable 1 View Result Date: 12/26/2024 CLINICAL DATA:  Shortness of breath EXAM: PORTABLE CHEST 1 VIEW COMPARISON:  08/18/2024 FINDINGS: Pain post sternotomy changes. Left-sided pacing device. Right-sided stimulator generator with ascending lead. Cardiomegaly. Small pleural effusions are suspected. No focal consolidation. Aortic atherosclerosis. IMPRESSION: Cardiomegaly with small pleural effusions. Electronically Signed   By: Luke Bun M.D.   On: 12/26/2024 20:11     Scheduled Meds:  apixaban   2.5 mg Oral BID   atorvastatin   80 mg Oral Daily   benzonatate   100 mg Oral TID   Chlorhexidine  Gluconate Cloth  6 each Topical Daily   dapagliflozin  propanediol  10 mg Oral Daily   digoxin   0.0625 mg Oral Daily   docusate sodium   100 mg Oral BID   insulin  aspart  0-5 Units Subcutaneous QHS   insulin  aspart  0-6 Units Subcutaneous TID WC   ipratropium-albuterol   3 mL Nebulization Q6H   ivabradine   7.5 mg Oral BID WC   methylPREDNISolone  (SOLU-MEDROL ) injection  40 mg Intravenous Daily   metolazone   5 mg Oral BID   pantoprazole   40 mg Oral Q0600    potassium chloride   40 mEq Oral BID   sodium chloride  flush  10-40 mL Intracatheter Q12H   sodium chloride  flush  3 mL Intravenous Q12H   sodium chloride  flush  3 mL Intravenous Q12H   spironolactone   25 mg Oral Daily   Continuous Infusions:  DOBUTamine      furosemide  (LASIX ) 200 mg in dextrose  5 % 100 mL (2 mg/mL) infusion 20 mg/hr (12/28/24 0209)     LOS: 2 days    Time spent:    Sigurd Pac, MD [facility]   12/28/2024, 10:24 AM    "

## 2024-12-28 NOTE — Consult Note (Signed)
 "   Advanced Heart Failure Team Consult Note   Primary Physician: Brittany Bleacher, MD Cardiologist:  Dr. Cherrie   Reason for Consultation: acute on chronic systolic heart failure   HPI:    Brittany [name]  is seen today for evaluation of acute on chronic systolic heart failure at the request of Dr. Stacia Leita [name]  is a 64 y.o. female with a history of PAF, CAD s/p emergent CABG in 2019 and DES in 2020, R renal infarct in 2022,  previous smoker quit in 2015, hypertension, previous small cell lung cancer treated with chemo, chest XRT and prophylactic brain radiation in 2015, and chronic systolic HF EF ~25%   Admitted 01/2018 with NSTEMI and shock. Underwent emergent cath 02/03/18 showed LAD 100% stenosed, LCx 95% stenosed. Taken for emergent CABG 02/03/18. Required impella post op. Hospital course complicated by cardiogenic shock, HCAP, A fib, respiratory failure, and swallowing issues. She was discharged to SNF. Discharge weight 103 pounds.    In 2019 had multiple hospitalizations for HF and pleural effusion. Underwent pleurodesis at [facility].   Admitted 3/20 with NSTEMI and HF. Received DES to ostial ramus into distal left main based on cath below and underwent diuresis. Meds adjusted as tolerated. Echo with EF 25-30%.    Over past 2 years multiple hospitalizations for ADHF and COPD flares. Most recently 8/25. Has been on inotropes multiple times. Has been DNR but refused Hospice eval.   Echo 4/25 EF < 20 RV ok   Admitted yesterday with progressive SOB. Felt to be combination of COPD and HF. Called EMS. Sats in 53s. Started bipap.  troponin 44 ? 46.  CXR showed small pleural effusions, mild interstitial edema pro-BNP 12,900  Started IV lasix .   Had poor response to IV lasix . Lactic acid checked. 2.1 -> 3.2.  I was contacted and patient started on DBA. LA now 1.6   Medication Sig Start Date End Date Taking? Authorizing Provider  albuterol  (VENTOLIN  HFA) 108 (90  Base) MCG/ACT inhaler Inhale 2 puffs into the lungs every 6 (six) hours as needed for wheezing or shortness of breath. 06/20/22  Yes Brittany Frames, MD  atorvastatin  (LIPITOR ) 80 MG tablet Take 1 tablet (80 mg total) by mouth daily. Patient taking differently: Take 80 mg by mouth at bedtime. 08/06/23 11/20/24 Yes Brittany Bar, MD  cyclobenzaprine  (FLEXERIL ) 10 MG tablet Take 1 tablet (10 mg total) by mouth 3 (three) times daily as needed for muscle spasms. 10/11/23  Yes Brittany Bleacher, MD  digoxin  (LANOXIN ) 0.125 MG tablet Take 1 tablet (125 mcg total) by mouth at bedtime. Patient taking differently: Take 125 mcg by mouth daily. 04/18/24  Yes Brittany [name], Brittany SAUNDERS, MD  ELIQUIS  2.5 MG TABS tablet TAKE 1 TABLET BY MOUTH TWICE A DAY 12/07/23  Yes Brittany [name], Brittany SAUNDERS, MD  Furosemide  (FUROSCIX ) 80 MG/10ML CTKT Inject 80 mg into the skin as directed. Patient taking differently: Inject 80 mg into the skin See admin instructions. Inject 80mg  into the skin every other Thursday. 07/15/24  Yes Brittany [name], Brittany Ford, FNP  hydrOXYzine  (VISTARIL ) 25 MG capsule TAKE 1 CAPSULE (25 MG TOTAL) BY MOUTH DAILY AS NEEDED. 07/30/23  Yes Brittany [name], Brittany J, NP  ipratropium-albuterol  (DUONEB) 0.5-2.5 (3) MG/3ML SOLN TAKE 3 MLS BY NEBULIZATION EVERY 4 (FOUR) HOURS AS NEEDED (FOR SHORTNESS OF BREATH). 07/08/24  Yes Brittany Bleacher, MD  ivabradine  (CORLANOR ) 7.5 MG TABS tablet TAKE 1 TABLET BY MOUTH TWICE A DAY WITH FOOD 07/08/24  Yes Brittany [name], Brittany SAUNDERS, MD  megestrol  (MEGACE )  20 MG tablet Take 1 tablet (20 mg total) by mouth 2 (two) times daily. 02/26/24  Yes Brittany Bleacher, MD  metolazone  (ZAROXOLYN ) 5 MG tablet Take 1 tablet (5 mg total) by mouth as needed (as instructed by Advanced HF clinic). Patient taking differently: Take 5 mg by mouth See admin instructions. Take 1 tablet (5mg ) by mouth once a week, as needed for fluid retention. 04/18/24  Yes Brittany [name] R, MD  Multiple Vitamin (MULTIVITAMIN WITH MINERALS) TABS tablet Take 1  tablet by mouth daily.   Yes [provider]  Nutritional Supplements (ENSURE ORIGINAL) LIQD Take 1 Bottle by mouth 2 (two) times daily as needed (poor appetite).   Yes [provider]  potassium chloride  (KLOR-CON ) 20 MEQ packet Take 40 mEq by mouth 2 (two) times daily. Take an extra 60 meq (3 packets) when you take Metolazone  or furoscix  06/11/24  Yes Brittany [name], Brittany HERO, FNP  spironolactone  (ALDACTONE ) 50 MG tablet Take 1 tablet (50 mg total) by mouth daily. 04/07/24  Yes Brittany Frames, MD  torsemide  (DEMADEX ) 20 MG tablet Take 100 mg by mouth 2 (two) times daily.   Yes [provider]  dapagliflozin  propanediol (FARXIGA ) 10 MG TABS tablet Take 1 tablet (10 mg total) by mouth daily. Patient not taking: Reported on 07/16/2024 04/18/24   Brittany [name], Brittany SAUNDERS, MD  Fluticasone -Umeclidin-Vilant (TRELEGY ELLIPTA ) 200-62.5-25 MCG/ACT AEPB Inhale 1 puff into the lungs daily. Patient not taking: Reported on 07/16/2024 02/27/24   Brittany Bleacher, MD  nitroGLYCERIN  (NITROSTAT ) 0.4 MG SL tablet Place 1 tablet (0.4 mg total) under the tongue every 5 (five) minutes as needed for chest pain. Patient not taking: Reported on 07/16/2024 03/13/24   Lee, Jordan, NP    Past Medical History: Past Medical History:  Diagnosis Date   Acute respiratory failure (HCC) 05/05/2018   Acute systolic congestive heart failure (HCC) 02/03/2018   AICD (automatic cardioverter/defibrillator) present    Allergy    Anxiety    Asthma    DM2 (diabetes mellitus, type 2) (HCC) 10/20/2020   Hypertension    PAF (paroxysmal atrial fibrillation) (HCC)    Presence of permanent cardiac pacemaker    Prophylactic measure 08/03/14-08/19/14   Prophyl. 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   LIMA to LAD, SVG to D1, SVG to OM1, EVH via right thigh with implantation of Impella LD LVAD via direct aortic approach   Small cell lung cancer (HCC) 03/16/2014    Past Surgical History: Past Surgical History:   Procedure Laterality Date   BRONCHIAL BRUSHINGS  10/25/2020   Procedure: BRONCHIAL BRUSHINGS;  Surgeon: Shelah Lamar RAMAN, MD;  Location[facility] ENDOSCOPY;  Service: Pulmonary;;   BRONCHIAL NEEDLE ASPIRATION BIOPSY  10/25/2020   Procedure: BRONCHIAL NEEDLE ASPIRATION BIOPSIES;  Surgeon: Shelah Lamar RAMAN, MD;  Location: [facility] ENDOSCOPY;  Service: Pulmonary;;   CARDIAC DEFIBRILLATOR PLACEMENT  08/15/2018   MDT Visia AF MRI VR ICD implanted by Dr Zulma for primary prevention of sudden   CESAREAN SECTION     CORONARY ARTERY BYPASS GRAFT N/A 02/03/2018   Procedure: CORONARY ARTERY BYPASS GRAFTING (CABG);  Surgeon: Dusty Sudie DEL, MD;  Location: [facility] OR;  Service: Open Heart Surgery;  Laterality: N/A;  Time 3 using left internal mammary artery and endoscopically harvested right saphenous vein   CORONARY BALLOON ANGIOPLASTY N/A 02/03/2018   Procedure: CORONARY BALLOON ANGIOPLASTY;  Surgeon: [name], Peter M, MD;  Location: [facility] INVASIVE CV LAB;  Service: Cardiovascular;  Laterality: N/A;   CORONARY STENT  INTERVENTION N/A 02/12/2019   Procedure: CORONARY STENT INTERVENTION;  Surgeon: Darron Deatrice LABOR, MD;  Location: [facility] INVASIVE CV LAB;  Service: Cardiovascular;  Laterality: N/A;   CORONARY/GRAFT ACUTE MI REVASCULARIZATION N/A 02/03/2018   Procedure: Coronary/Graft Acute MI Revascularization;  Surgeon: [name], Peter M, MD;  Location: [facility] INVASIVE CV LAB;  Service: Cardiovascular;  Laterality: N/A;   ENDOBRONCHIAL ULTRASOUND N/A 10/25/2020   Procedure: ENDOBRONCHIAL ULTRASOUND;  Surgeon: Shelah Lamar RAMAN, MD;  Location: [facility] ENDOSCOPY;  Service: Pulmonary;  Laterality: N/A;   FLEXIBLE BRONCHOSCOPY  10/25/2020   Procedure: FLEXIBLE BRONCHOSCOPY;  Surgeon: Shelah Lamar RAMAN, MD;  Location: [facility] ENDOSCOPY;  Service: Pulmonary;;   IABP INSERTION N/A 02/03/2018   Procedure: IABP Insertion;  Surgeon: [name], Peter M, MD;  Location: [facility] INVASIVE CV LAB;  Service: Cardiovascular;  Laterality: N/A;   INTRAOPERATIVE TRANSESOPHAGEAL  ECHOCARDIOGRAM N/A 02/03/2018   Procedure: INTRAOPERATIVE TRANSESOPHAGEAL ECHOCARDIOGRAM;  Surgeon: Dusty Sudie DEL, MD;  Location: [facility] OR;  Service: Open Heart Surgery;  Laterality: N/A;   LEFT HEART CATH AND CORONARY ANGIOGRAPHY N/A 02/03/2018   Procedure: LEFT HEART CATH AND CORONARY ANGIOGRAPHY;  Surgeon: [name], Peter M, MD;  Location: [facility] INVASIVE CV LAB;  Service: Cardiovascular;  Laterality: N/A;   LEFT HEART CATH AND CORS/GRAFTS ANGIOGRAPHY N/A 02/12/2019   Procedure: LEFT HEART CATH AND CORS/GRAFTS ANGIOGRAPHY;  Surgeon: Darron Deatrice LABOR, MD;  Location: [facility] INVASIVE CV LAB;  Service: Cardiovascular;  Laterality: N/A;   MEDIASTINOSCOPY N/A 03/11/2014   Procedure: MEDIASTINOSCOPY;  Surgeon: Elspeth JAYSON Millers, MD;  Location: [facility] OR;  Service: Thoracic;  Laterality: N/A;   PLACEMENT OF IMPELLA LEFT VENTRICULAR ASSIST DEVICE  02/03/2018   Procedure: PLACEMENT OF IMPELLA LEFT VENTRICULAR ASSIST DEVICE LD;  Surgeon: Dusty Sudie DEL, MD;  Location: [facility] OR;  Service: Open Heart Surgery;;   REMOVAL OF IMPELLA LEFT VENTRICULAR ASSIST DEVICE N/A 02/08/2018   Procedure: REMOVAL OF IMPELLA LEFT VENTRICULAR ASSIST DEVICE;  Surgeon: Dusty Sudie DEL, MD;  Location: [facility] OR;  Service: Open Heart Surgery;  Laterality: N/A;   RIGHT HEART CATH N/A 02/03/2018   Procedure: RIGHT HEART CATH;  Surgeon: [name], Peter M, MD;  Location: [facility] INVASIVE CV LAB;  Service: Cardiovascular;  Laterality: N/A;   RIGHT HEART CATH N/A 05/09/2018   Procedure: RIGHT HEART CATH;  Surgeon: Brittany [name] Brittany SAUNDERS, MD;  Location: [facility] INVASIVE CV LAB;  Service: Cardiovascular;  Laterality: N/A;   TEE WITHOUT CARDIOVERSION N/A 02/08/2018   Procedure: TRANSESOPHAGEAL ECHOCARDIOGRAM (TEE);  Surgeon: Dusty Sudie DEL, MD;  Location: [facility] OR;  Service: Open Heart Surgery;  Laterality: N/A;   TUBAL LIGATION     VIDEO BRONCHOSCOPY WITH ENDOBRONCHIAL ULTRASOUND N/A 03/11/2014   Procedure: VIDEO BRONCHOSCOPY WITH ENDOBRONCHIAL ULTRASOUND;  Surgeon: Elspeth JAYSON Millers, MD;  Location: [facility] OR;  Service: Thoracic;  Laterality: N/A;    Family History: Family History  Problem Relation Age of Onset   Heart disease Mother    Hypertension Mother    Heart attack Mother    Hypertension Maternal Grandmother    Cancer Maternal Grandmother    Diabetes Paternal Grandmother    Stroke Neg Hx     Social History: Social History   Socioeconomic History   Marital status: Married    Spouse name: Keven Washington     Number of children: 1   Years of education: Not on file   Highest education level: Not on file  Occupational History   Occupation: disabled  Tobacco Use   Smoking status: Former    Current packs/day:  0.00    Average packs/day: 1 pack/day for 20.0 years (20.0 ttl pk-yrs)    Types: Cigarettes    Start date: 03/13/1994    Quit date: 03/13/2014    Years since quitting: 10.8   Smokeless tobacco: Never  Vaping Use   Vaping status: Never Used  Substance and Sexual Activity   Alcohol use: Yes    Alcohol/week: 5.0 standard drinks of alcohol    Types: 5 Glasses of wine per week    Comment: a few glasses of wine weekly   Drug use: Yes    Types: Marijuana    Comment: medicinal, not daily   Sexual activity: Never  Other Topics Concern   Not on file  Social History Narrative   Not on file   Social Drivers of Health   Tobacco Use: Medium Risk (12/26/2024)   Patient History    Smoking Tobacco Use: Former    Smokeless Tobacco Use: Never    Passive Exposure: Not on Actuary Strain: Not on file  Food Insecurity: No Food Insecurity (08/18/2024)   Epic    Worried About Programme Researcher, Broadcasting/film/video in the Last Year: Never true    Ran Out of Food in the Last Year: Never true  Transportation Needs: Unmet Transportation Needs (11/17/2024)   Epic    Lack of Transportation (Medical): Yes    Lack of Transportation (Non-Medical): No  Physical Activity: Insufficiently Active (08/14/2024)   Exercise Vital Sign    Days of Exercise per Week: 2  days    Minutes of Exercise per Session: 20 min  Stress: Not on file  Social Connections: Moderately Isolated (08/18/2024)   Social Connection and Isolation Panel    Frequency of Communication with Friends and Family: More than three times a week    Frequency of Social Gatherings with Friends and Family: Never    Attends Religious Services: Never    Database Administrator or Organizations: No    Attends Banker Meetings: Never    Marital Status: Married  Depression (PHQ2-9): Low Risk (07/22/2024)   Depression (PHQ2-9)    PHQ-2 Score: 0  Alcohol Screen: Low Risk (08/14/2024)   Alcohol Screen    Last Alcohol Screening Score (AUDIT): 3  Housing: Low Risk (08/18/2024)   Epic    Unable to Pay for Housing in the Last Year: No    Number of Times Moved in the Last Year: 0    Homeless in the Last Year: No  Utilities: Not At Risk (08/18/2024)   Epic    Threatened with loss of utilities: No  Health Literacy: Not on file    Allergies:  Allergies  Allergen Reactions   Sulfa Antibiotics Hives    Patient requested to have this undeleted, had this reaction about 5 years ago.   Lactose Intolerance (Gi) Diarrhea   Codeine Nausea And Vomiting    Objective:    Vital Signs:   Temp:  [97.6 F (36.4 C)-98.5 F (36.9 C)] 97.9 F (36.6 C) (01/18 0715) Pulse Rate:  [81-104] 104 (01/18 1100) Resp:  [13-31] 20 (01/18 0715) BP: (92-127)/(48-81) 92/48 (01/18 1100) SpO2:  [98 %-100 %] 98 % (01/18 1415) FiO2 (%):  [28 %-30 %] 28 % (01/18 1415) Weight:  [37.9 kg-43.4 kg] 43.4 kg (01/18 0332) Last BM Date : 12/26/24  Weight change: Filed Weights   12/26/24 1955 12/27/24 1742 12/28/24 0332  Weight: 39.5 kg 37.9 kg 43.4 kg    Intake/Output:   Intake/Output Summary (  Last 24 hours) at 12/28/2024 1440 Last data filed at 12/28/2024 1200 Gross per 24 hour  Intake --  Output 3250 ml  Net -3250 ml      Physical Exam    Vitals:   12/28/24 1100 12/28/24 1415  BP: (!) 92/48   Pulse: (!)  104   Resp:    Temp:    SpO2:  98%   General: Chronically ill appearing HEENT: normal Neck: supple. JVP 10 Cor: Regular tachy  Lungs: + wheezing Abdomen: soft, nontender, nondistended.Good bowel sounds. Extremities: no cyanosis, clubbing, rash, edema Neuro: alert & orientedx3, cranial nerves grossly intact. moves all 4 extremities w/o difficulty. Affect pleasant   Telemetry   Sinus tach ~100 w/ barostim artifact. Personally reviewed  EKG    SR 106 + Barostim artifact, Personally reviewed   Labs   Basic Metabolic Panel: Recent Labs  Lab 12/26/24 2003 12/26/24 2038 12/27/24 0447 12/28/24 0239  NA 137 139 134* 139  K 4.1 3.8 4.3 3.3*  CL 100  --  99 95*  CO2 22  --  19* 27  GLUCOSE 148*  --  288* 87  BUN 34*  --  39* 52*  CREATININE 1.07*  --  1.25* 1.49*  CALCIUM  10.0  --  10.0 10.3    Liver Function Tests: Recent Labs  Lab 12/26/24 2003 12/27/24 0447  AST 36 32  ALT 29 28  ALKPHOS 93 85  BILITOT 0.8 0.9  PROT 7.1 7.2  ALBUMIN  4.2 4.5   No results for input(s): LIPASE, AMYLASE in the last 168 hours. No results for input(s): AMMONIA in the last 168 hours.  CBC: Recent Labs  Lab 12/26/24 2003 12/26/24 2038 12/27/24 0447 12/28/24 0239  WBC 7.5  --  7.5 9.3  NEUTROABS 5.6  --   --   --   HGB 15.0 15.6* 13.9 14.7  HCT 46.5* 46.0 44.3 44.8  MCV 91.9  --  91.5 88.5  PLT 208  --  197 228    Cardiac Enzymes: No results for input(s): CKTOTAL, CKMB, CKMBINDEX, TROPONINI in the last 168 hours.  BNP: BNP (last 3 results) Recent Labs    09/16/24 1115 10/22/24 1526 11/17/24 1618  BNP 1,596.2* 1,589.7* 1,898.1*    ProBNP (last 3 results) Recent Labs    12/26/24 2003  PROBNP 12,902.0*     CBG: Recent Labs  Lab 12/27/24 0839 12/27/24 1315 12/27/24 2106 12/28/24 0612 12/28/24 1200  GLUCAP 254* 167* 240* 116* 174*    Coagulation Studies: No results for input(s): LABPROT, INR in the last 72 hours.   Imaging   US   EKG SITE RITE Result Date: 12/28/2024 If Site Rite image not attached, placement could not be confirmed due to current cardiac rhythm.  US  EKG SITE RITE Result Date: 12/28/2024 If Site Rite image not attached, placement could not be confirmed due to current cardiac rhythm.    Medications:     Current Medications:  apixaban   2.5 mg Oral BID   atorvastatin   80 mg Oral Daily   benzonatate   100 mg Oral TID   Chlorhexidine  Gluconate Cloth  6 each Topical Daily   dapagliflozin  propanediol  10 mg Oral Daily   digoxin   0.0625 mg Oral Daily   docusate sodium   100 mg Oral BID   insulin  aspart  0-5 Units Subcutaneous QHS   insulin  aspart  0-6 Units Subcutaneous TID WC   ipratropium-albuterol   3 mL Nebulization Q6H   ivabradine   7.5 mg Oral BID WC  metolazone   5 mg Oral BID   pantoprazole   40 mg Oral Q0600   potassium chloride   40 mEq Oral BID   sodium chloride  flush  10-40 mL Intracatheter Q12H   sodium chloride  flush  3 mL Intravenous Q12H   sodium chloride  flush  3 mL Intravenous Q12H   spironolactone   25 mg Oral Daily    Infusions:  DOBUTamine  2.5 mcg/kg/min (12/28/24 1138)   furosemide  (LASIX ) 200 mg in dextrose  5 % 100 mL (2 mg/mL) infusion 20 mg/hr (12/28/24 0209)      Patient Profile   Brittany [name]  is a 64 y.o. 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   Assessment/Plan   1. Acute on Chronic Systolic Heart Failure, end-stage - HF felt to be end-stage. EF has been 20-25% for the last 5 years.  - S/p Medtronic ICD and Barostim - Last Echo 4/25: EF < 20%, RV normal  - Numerous hospitalizations over last 2 years  - Now readmitted w/ NYHA IV symptoms and volume overload  c/b low output with lactic acidosis and cardio renal syndrome - Coox up to 69% on DBA 2.5 - CVP 10. Continue lasix  gtt - continue Farxiga  10 mg daily  - continue ivabradine  7.5 mg bid - continue digoxin  - she is not a candidate for advanced therapies. We discussed consideration of palliative support previous hospitalization but pt not yet ready for this.  2. Hypokalemia - a chronic problem for her - K 3.3. Will supp - continue Spiro 50 mg daily   3. A/c Hypoxic Respiratory Failure - multifactorial 2/2 a/c CHF and ACOPDE - diuresis per above - continue O2 - on COPD treatment per IM  3. CKD Stage IIIb  - Creatinine baseline ~ 1.4-1.7 - SCr 1.49 today - Continue SGLT2i - monitor w/ diuresis    4. PAF  - In NSR - Continue Eliquis  2.5 mg bid. Reduced dose (weight/creatinine).   5. CAD - History of CABG x 2 2019 - s/p DES ostial ramus extending to left main (2020) - no s/s angina. Hstrop ok - Continue atorvastatin  80 mg daily - No ASA with need for AC   6. H/o SCLC - Completed treatment 2015.  - CT chest (5/24) soft tissue thickening along mediastinum but no discrete mass    7. H/o Renal Infarct 2022 - Continue Eliquis . No bleeding  8. DNR/DNI   Length of Stay: 2  Brittany Fuel, MD  12/28/2024, 2:40 PM    Advanced Heart Failure Team Pager [phone] (M-F; 7a - 5p)  Please contact [facility] for night-coverage after hours (4p -7a ) and weekends on [email or web address]  "

## 2024-12-28 NOTE — Progress Notes (Addendum)
" ° °  On DBA 2.5  Co-ox 69%. CVP 7  Can hold off on ICU transport.   Will repeat stat lactic acid.   Toribio Fuel, MD  1:00 PM  "

## 2024-12-28 NOTE — Plan of Care (Signed)
  Problem: Education: Goal: Ability to describe self-care measures that may prevent or decrease complications (Diabetes Survival Skills Education) will improve Outcome: Progressing Goal: Individualized Educational Video(s) Outcome: Progressing   Problem: Coping: Goal: Ability to adjust to condition or change in health will improve Outcome: Progressing   Problem: Fluid Volume: Goal: Ability to maintain a balanced intake and output will improve Outcome: Progressing   Problem: Health Behavior/Discharge Planning: Goal: Ability to identify and utilize available resources and services will improve Outcome: Progressing Goal: Ability to manage health-related needs will improve Outcome: Progressing   Problem: Metabolic: Goal: Ability to maintain appropriate glucose levels will improve Outcome: Progressing   Problem: Nutritional: Goal: Maintenance of adequate nutrition will improve Outcome: Progressing Goal: Progress toward achieving an optimal weight will improve Outcome: Progressing   Problem: Skin Integrity: Goal: Risk for impaired skin integrity will decrease Outcome: Progressing   Problem: Tissue Perfusion: Goal: Adequacy of tissue perfusion will improve Outcome: Progressing   Problem: Education: Goal: Knowledge of General Education information will improve Description: Including pain rating scale, medication(s)/side effects and non-pharmacologic comfort measures Outcome: Progressing   Problem: Health Behavior/Discharge Planning: Goal: Ability to manage health-related needs will improve Outcome: Progressing   Problem: Clinical Measurements: Goal: Ability to maintain clinical measurements within normal limits will improve Outcome: Progressing Goal: Will remain free from infection Outcome: Progressing Goal: Diagnostic test results will improve Outcome: Progressing Goal: Respiratory complications will improve Outcome: Progressing Goal: Cardiovascular complication will  be avoided Outcome: Progressing   Problem: Activity: Goal: Risk for activity intolerance will decrease Outcome: Progressing   Problem: Nutrition: Goal: Adequate nutrition will be maintained Outcome: Progressing   Problem: Coping: Goal: Level of anxiety will decrease Outcome: Progressing   Problem: Elimination: Goal: Will not experience complications related to bowel motility Outcome: Progressing Goal: Will not experience complications related to urinary retention Outcome: Progressing   Problem: Pain Managment: Goal: General experience of comfort will improve and/or be controlled Outcome: Progressing   Problem: Safety: Goal: Ability to remain free from injury will improve Outcome: Progressing   Problem: Skin Integrity: Goal: Risk for impaired skin integrity will decrease Outcome: Progressing   Problem: Education: Goal: Ability to demonstrate management of disease process will improve Outcome: Progressing Goal: Ability to verbalize understanding of medication therapies will improve Outcome: Progressing Goal: Individualized Educational Video(s) Outcome: Progressing   Problem: Activity: Goal: Capacity to carry out activities will improve Outcome: Progressing   Problem: Cardiac: Goal: Ability to achieve and maintain adequate cardiopulmonary perfusion will improve Outcome: Progressing   Problem: Education: Goal: Knowledge of disease or condition will improve Outcome: Progressing Goal: Knowledge of the prescribed therapeutic regimen will improve Outcome: Progressing Goal: Individualized Educational Video(s) Outcome: Progressing   Problem: Activity: Goal: Ability to tolerate increased activity will improve Outcome: Progressing Goal: Will verbalize the importance of balancing activity with adequate rest periods Outcome: Progressing   Problem: Respiratory: Goal: Ability to maintain a clear airway will improve Outcome: Progressing Goal: Levels of oxygenation  will improve Outcome: Progressing Goal: Ability to maintain adequate ventilation will improve Outcome: Progressing

## 2024-12-28 NOTE — Progress Notes (Signed)
 "   Progress Note  Patient Name: Brittany [name]  Date of Encounter: 12/28/2024  Primary Cardiologist: None  Subjective   SOB improved a little but not a lot, per patient.  Inpatient Medications    Scheduled Meds:  apixaban   2.5 mg Oral BID   atorvastatin   80 mg Oral Daily   benzonatate   100 mg Oral TID   dapagliflozin  propanediol  10 mg Oral Daily   digoxin   0.0625 mg Oral Daily   docusate sodium   100 mg Oral BID   insulin  aspart  0-5 Units Subcutaneous QHS   insulin  aspart  0-6 Units Subcutaneous TID WC   ipratropium-albuterol   3 mL Nebulization Q6H   ivabradine   7.5 mg Oral BID WC   methylPREDNISolone  (SOLU-MEDROL ) injection  40 mg Intravenous Daily   metolazone   5 mg Oral BID   pantoprazole   40 mg Oral Q0600   sodium chloride  flush  3 mL Intravenous Q12H   sodium chloride  flush  3 mL Intravenous Q12H   spironolactone   25 mg Oral Daily   Continuous Infusions:  DOBUTamine      furosemide  (LASIX ) 200 mg in dextrose  5 % 100 mL (2 mg/mL) infusion 20 mg/hr (12/28/24 0209)   PRN Meds: acetaminophen  **OR** acetaminophen , ipratropium-albuterol , sodium chloride  flush   Vital Signs    Vitals:   12/27/24 1942 12/27/24 2248 12/28/24 0332 12/28/24 0715  BP:  103/62 (!) 97/53 (!) 97/55  Pulse:  93 91 94  Resp: (!) 31 19 20 20   Temp: 98.5 F (36.9 C) 97.7 F (36.5 C) 97.9 F (36.6 C) 97.9 F (36.6 C)  TempSrc: Oral Axillary Axillary Axillary  SpO2:  100%  100%  Weight:   43.4 kg   Height:        Intake/Output Summary (Last 24 hours) at 12/28/2024 0758 Last data filed at 12/28/2024 0334 Gross per 24 hour  Intake --  Output 2300 ml  Net -2300 ml   Filed Weights   12/26/24 1955 12/27/24 1742 12/28/24 0332  Weight: 39.5 kg 37.9 kg 43.4 kg    Telemetry     Personally reviewed.  NSR, HR 90s.  ECG    Not performed today.  Physical Exam   GEN:in mild acute respiratory distress.   Neck: JVD plus Cardiac: RRR, no murmur, rub, or gallop.  Respiratory:  Nonlabored. Clear to auscultation bilaterally. GI: Soft, nontender, bowel sounds present. MS: No edema; No deformity. Neuro:  Nonfocal. Psych: Alert and oriented x 3. Normal affect.  Labs    Chemistry Recent Labs  Lab 12/26/24 2003 12/26/24 2038 12/27/24 0447 12/28/24 0239  NA 137 139 134* 139  K 4.1 3.8 4.3 3.3*  CL 100  --  99 95*  CO2 22  --  19* 27  GLUCOSE 148*  --  288* 87  BUN 34*  --  39* 52*  CREATININE 1.07*  --  1.25* 1.49*  CALCIUM  10.0  --  10.0 10.3  PROT 7.1  --  7.2  --   ALBUMIN  4.2  --  4.5  --   AST 36  --  32  --   ALT 29  --  28  --   ALKPHOS 93  --  85  --   BILITOT 0.8  --  0.9  --   GFRNONAA 58*  --  48* 39*  ANIONGAP 14  --  16* 17*     Hematology Recent Labs  Lab 12/26/24 2003 12/26/24 2038 12/27/24 0447 12/28/24 0239  WBC 7.5  --  7.5 9.3  RBC 5.06  --  4.84 5.06  HGB 15.0 15.6* 13.9 14.7  HCT 46.5* 46.0 44.3 44.8  MCV 91.9  --  91.5 88.5  MCH 29.6  --  28.7 29.1  MCHC 32.3  --  31.4 32.8  RDW 17.7*  --  17.1* 17.4*  PLT 208  --  197 228    Cardiac EnzymesNo results for input(s): TROPONINIHS in the last 720 hours.  BNP Recent Labs  Lab 12/26/24 2003  PROBNP 12,902.0*     DDimerNo results for input(s): DDIMER in the last 168 hours.   Radiology    US  EKG SITE RITE Result Date: 12/28/2024 If Site Rite image not attached, placement could not be confirmed due to current cardiac rhythm.  DG Chest Portable 1 View Result Date: 12/27/2024 EXAM: 1 VIEW(S) XRAY OF THE CHEST 12/27/2024 03:46:00 AM COMPARISON: 12/26/2024 CLINICAL HISTORY: Shortness of breath Shortness of breath Shortness of breath Shortness of breath Shortness of breath FINDINGS: LINES, TUBES AND DEVICES: Implanted stimulator device overlies right hemithorax extending into neck. Stable left subclavian AICD. LUNGS AND PLEURA: Mild perihilar interstitial edema, slightly increased. Small pleural effusions. No focal pulmonary opacity. No pneumothorax. HEART AND  MEDIASTINUM: Stable cardiomegaly. Aortic atherosclerosis. CABG markers noted. Stable left subclavian AICD. BONES AND SOFT TISSUES: Sternotomy wires noted. Implanted stimulator device overlies right hemithorax extending into neck. No acute osseous abnormality. IMPRESSION: 1. Mild perihilar interstitial edema, slightly increased. 2. Small pleural effusions. Electronically signed by: Evalene Coho MD 12/27/2024 04:00 AM EST RP Workstation: [id number]   DG Chest Portable 1 View Result Date: 12/26/2024 CLINICAL DATA:  Shortness of breath EXAM: PORTABLE CHEST 1 VIEW COMPARISON:  08/18/2024 FINDINGS: Pain post sternotomy changes. Left-sided pacing device. Right-sided stimulator generator with ascending lead. Cardiomegaly. Small pleural effusions are suspected. No focal consolidation. Aortic atherosclerosis. IMPRESSION: Cardiomegaly with small pleural effusions. Electronically Signed   By: Luke Bun M.D.   On: 12/26/2024 20:11    Assessment & Plan   Acute systolic and diastolic HF Cardiorenal syndrome Ischemic cardiomyopathy LVEF less than 20% s/p ICD Lactic acidosis - Presented with worsening SOB x few days. No leg swelling. - Pro-BNP 12,902. CXR showed mild pulmonary vascular congestion.  - IV Lasix  80 mg x 1 followed by lasix  drip 20 mg/hr since yesterday.  2.3 L urine output in the last 24 hours with net -2.3 L on Lasix  drip 20 mg/h. - Patient reported little improvement in SOB but not a lot.  Definitely better since yesterday.  Lactic acid and serum creatinine are trending up.  Start dobutamine  drip at 2.5.  Discussed case with Dr. Bensimhon.  Patient will be transferred to [facility] unit. - Continue metolazone  5 mg twice daily. - Continue Corlanor  7.5 mg twice daily. - Continue spironolactone  25 mg once daily. - Continue digoxin  0.0625 mg once daily.  Continue Farxiga  10 mg once daily.   PAF - Telemetry reviewed, in NSR, HR 90s. - Continue Corlanor  7.5 mg twice daily. - Continue Eliquis  2.5 mg  twice daily.   CAD s/p CABG - Not on aspirin  due to Eliquis  use, continue atorvastatin  80 mg nightly. - Mild elevation of troponins is likely secondary to volume overload.   40 minutes spent in reviewing prior medical records, reports, more than 3 labs, discussion and documentation.  Signed, Diannah SHAUNNA Maywood, MD  12/28/2024, 7:58 AM    "

## 2024-12-28 NOTE — Progress Notes (Signed)
 Peripherally Inserted Central Catheter Placement  The IV Nurse has discussed with the patient and/or persons authorized to consent for the patient, the purpose of this procedure and the potential benefits and risks involved with this procedure.  The benefits include less needle sticks, lab draws from the catheter, and the patient may be discharged home with the catheter. Risks include, but not limited to, infection, bleeding, blood clot (thrombus formation), and puncture of an artery; nerve damage and irregular heartbeat and possibility to perform a PICC exchange if needed/ordered by physician.  Alternatives to this procedure were also discussed.  Bard Power PICC patient education guide, fact sheet on infection prevention and patient information card has been provided to patient /or left at bedside.    PICC Placement Documentation  PICC Double Lumen 12/28/24 Right Brachial 33 cm 0 cm (Active)  Indication for Insertion or Continuance of Line Vasoactive infusions;Chronic illness with exacerbations (CF, Sickle Cell, etc.) 12/28/24 0936  Exposed Catheter (cm) 0 cm 12/28/24 0936  Site Assessment Clean, Dry, Intact 12/28/24 0936  Lumen #1 Status Flushed;Saline locked;Blood return noted 12/28/24 0936  Lumen #2 Status Flushed;Saline locked;Blood return noted 12/28/24 0936  Dressing Type Transparent;Securing device 12/28/24 0936  Dressing Status Antimicrobial disc/dressing in place;Clean, Dry, Intact 12/28/24 0936  Line Care Connections checked and tightened 12/28/24 0936  Line Adjustment (NICU/IV Team Only) No 12/28/24 0936  Dressing Intervention New dressing;Adhesive placed at insertion site (IV team only);Adhesive placed around edges of dressing (IV team/ICU RN only) 12/28/24 0936  Dressing Change Due 01/04/25 12/28/24 0936       Brittany [name] 12/28/2024, 9:37 AM

## 2024-12-29 ENCOUNTER — Ambulatory Visit (INDEPENDENT_AMBULATORY_CARE_PROVIDER_SITE_OTHER): Payer: BC Managed Care – PPO

## 2024-12-29 ENCOUNTER — Ambulatory Visit (HOSPITAL_COMMUNITY)
Admission: RE | Admit: 2024-12-29 | Discharge: 2024-12-29 | Disposition: A | Discharge status: Home / Self Care | Source: Ambulatory Visit

## 2024-12-29 DIAGNOSIS — I5022 Chronic systolic (congestive) heart failure: Secondary | ICD-10-CM

## 2024-12-29 DIAGNOSIS — I5041 Acute combined systolic (congestive) and diastolic (congestive) heart failure: Secondary | ICD-10-CM | POA: Diagnosis not present

## 2024-12-29 LAB — BASIC METABOLIC PANEL WITH GFR
Anion gap: 14 (ref 5–15)
BUN: 63 mg/dL — ABNORMAL HIGH (ref 8–23)
CO2: 36 mmol/L — ABNORMAL HIGH (ref 22–32)
Calcium: 10.9 mg/dL — ABNORMAL HIGH (ref 8.9–10.3)
Chloride: 85 mmol/L — ABNORMAL LOW (ref 98–111)
Creatinine, Ser: 1.73 mg/dL — ABNORMAL HIGH (ref 0.44–1.00)
GFR, Estimated: 32 mL/min — ABNORMAL LOW
Glucose, Bld: 156 mg/dL — ABNORMAL HIGH (ref 70–99)
Potassium: 3 mmol/L — ABNORMAL LOW (ref 3.5–5.1)
Sodium: 135 mmol/L (ref 135–145)

## 2024-12-29 LAB — GLUCOSE, CAPILLARY
Glucose-Capillary: 184 mg/dL — ABNORMAL HIGH (ref 70–99)
Glucose-Capillary: 194 mg/dL — ABNORMAL HIGH (ref 70–99)
Glucose-Capillary: 117 mg/dL — ABNORMAL HIGH (ref 70–99)
Glucose-Capillary: 149 mg/dL — ABNORMAL HIGH (ref 70–99)

## 2024-12-29 LAB — COOXEMETRY PANEL
Carboxyhemoglobin: 1.8 % — ABNORMAL HIGH (ref 0.5–1.5)
Methemoglobin: <0.7 % (ref 0.0–1.5)
O2 Saturation: 76.9 %
Total hemoglobin: 16 g/dL (ref 12.0–16.0)

## 2024-12-29 LAB — MAGNESIUM: Magnesium: 2.5 mg/dL — ABNORMAL HIGH (ref 1.7–2.4)

## 2024-12-29 MED ORDER — TRAMADOL HCL 50 MG PO TABS
50.0000 mg | ORAL_TABLET | Freq: Two times a day (BID) | ORAL | Status: DC | PRN
  Administered 2024-12-29 – 2024-12-30 (×2): 50 mg via ORAL
  Filled 2024-12-29 (×2): qty 1

## 2024-12-29 MED ORDER — POTASSIUM CHLORIDE 20 MEQ PO PACK
40.0000 meq | PACK | ORAL | Status: AC
  Administered 2024-12-29: 40 meq via ORAL
  Filled 2024-12-29 (×3): qty 2

## 2024-12-29 NOTE — Plan of Care (Signed)
  Problem: Education: Goal: Ability to describe self-care measures that may prevent or decrease complications (Diabetes Survival Skills Education) will improve Outcome: Progressing Goal: Individualized Educational Video(s) Outcome: Progressing   Problem: Coping: Goal: Ability to adjust to condition or change in health will improve Outcome: Progressing   Problem: Fluid Volume: Goal: Ability to maintain a balanced intake and output will improve Outcome: Progressing   Problem: Health Behavior/Discharge Planning: Goal: Ability to identify and utilize available resources and services will improve Outcome: Progressing Goal: Ability to manage health-related needs will improve Outcome: Progressing   Problem: Metabolic: Goal: Ability to maintain appropriate glucose levels will improve Outcome: Progressing   Problem: Nutritional: Goal: Maintenance of adequate nutrition will improve Outcome: Progressing Goal: Progress toward achieving an optimal weight will improve Outcome: Progressing   Problem: Skin Integrity: Goal: Risk for impaired skin integrity will decrease Outcome: Progressing   Problem: Tissue Perfusion: Goal: Adequacy of tissue perfusion will improve Outcome: Progressing   Problem: Education: Goal: Knowledge of General Education information will improve Description: Including pain rating scale, medication(s)/side effects and non-pharmacologic comfort measures Outcome: Progressing   Problem: Health Behavior/Discharge Planning: Goal: Ability to manage health-related needs will improve Outcome: Progressing   Problem: Clinical Measurements: Goal: Ability to maintain clinical measurements within normal limits will improve Outcome: Progressing Goal: Will remain free from infection Outcome: Progressing Goal: Diagnostic test results will improve Outcome: Progressing Goal: Respiratory complications will improve Outcome: Progressing Goal: Cardiovascular complication will  be avoided Outcome: Progressing   Problem: Activity: Goal: Risk for activity intolerance will decrease Outcome: Progressing   Problem: Nutrition: Goal: Adequate nutrition will be maintained Outcome: Progressing   Problem: Coping: Goal: Level of anxiety will decrease Outcome: Progressing   Problem: Elimination: Goal: Will not experience complications related to bowel motility Outcome: Progressing Goal: Will not experience complications related to urinary retention Outcome: Progressing   Problem: Pain Managment: Goal: General experience of comfort will improve and/or be controlled Outcome: Progressing   Problem: Safety: Goal: Ability to remain free from injury will improve Outcome: Progressing   Problem: Skin Integrity: Goal: Risk for impaired skin integrity will decrease Outcome: Progressing   Problem: Education: Goal: Ability to demonstrate management of disease process will improve Outcome: Progressing Goal: Ability to verbalize understanding of medication therapies will improve Outcome: Progressing Goal: Individualized Educational Video(s) Outcome: Progressing   Problem: Activity: Goal: Capacity to carry out activities will improve Outcome: Progressing   Problem: Cardiac: Goal: Ability to achieve and maintain adequate cardiopulmonary perfusion will improve Outcome: Progressing   Problem: Education: Goal: Knowledge of disease or condition will improve Outcome: Progressing Goal: Knowledge of the prescribed therapeutic regimen will improve Outcome: Progressing Goal: Individualized Educational Video(s) Outcome: Progressing   Problem: Activity: Goal: Ability to tolerate increased activity will improve Outcome: Progressing Goal: Will verbalize the importance of balancing activity with adequate rest periods Outcome: Progressing   Problem: Respiratory: Goal: Ability to maintain a clear airway will improve Outcome: Progressing Goal: Levels of oxygenation  will improve Outcome: Progressing Goal: Ability to maintain adequate ventilation will improve Outcome: Progressing

## 2024-12-29 NOTE — TOC Initial Note (Signed)
 Transition of Care [facility]) - Initial/Assessment Note    Patient Details  Name: Brittany [name]  MRN: [id number] Date of Birth: [birth year]  Transition [facility] Care [facility] CM/SW Contact:    [name] Phone Number: [phone] 12/29/2024, 1:50 PM  Clinical Narrative:  HF CSW met with patient at bedside. Patient stated that she lives alone. Patient stated that she drives. Patient stated that she has no history of HH services. Patient stated that she uses oxygen, rollator, bipap and cpap machine. Patient stated that she has scale at home. Patient stated that she has a PCP. CSW explained that a hospital follow up appointment is typically scheduled closer towards dc. Patient is agreeable.   HF CSW/CM will continue to follow and monitor for dc readiness.                   Expected Discharge Plan: Home/Self Care Barriers to Discharge: Continued Medical Work up   Patient Goals and CMS Choice Patient states their goals for this hospitalization and ongoing recovery are:: feel better CMS Medicare.gov Compare Post Acute Care list provided to:: Patient Choice offered to / list presented to : Patient [place] ownership interest in [facility].provided to:: Patient    Expected Discharge Plan and Services       Living arrangements for the past 2 months: Single Family Home                                      Prior Living Arrangements/Services Living arrangements for the past 2 months: Single Family Home Lives with:: Self Patient language and need for interpreter reviewed:: Yes Do you feel safe going back to the place where you live?: Yes      Need for Family Participation in Patient Care: No (Comment) Care giver support system in place?: Yes (comment)   Criminal Activity/Legal Involvement Pertinent to Current Situation/Hospitalization: No - Comment as needed  Activities of Daily Living   ADL Screening (condition at time of admission) Independently  performs ADLs?: Yes (appropriate for developmental age) Is the patient deaf or have difficulty hearing?: No Does the patient have difficulty seeing, even when wearing glasses/contacts?: No Does the patient have difficulty concentrating, remembering, or making decisions?: No  Permission Sought/Granted Permission sought to share information with : PCP                Emotional Assessment Appearance:: Appears older than stated age Attitude/Demeanor/Rapport: Engaged Affect (typically observed): Restless Orientation: : Oriented to Self, Oriented to Place, Oriented to  Time, Oriented to Situation Alcohol / Substance Use: Not Applicable Psych Involvement: No (comment)  Admission diagnosis:  COPD exacerbation (HCC) [J44.1] Acute exacerbation of CHF (congestive heart failure) (HCC) [I50.9] Acute on chronic systolic congestive heart failure (HCC) [I50.23] Acute combined systolic and diastolic CHF, NYHA class 4 (HCC) [I50.41] Patient Active Problem List   Diagnosis Date Noted   Long term (current) use of anticoagulants 12/27/2024   Heart failure, diastolic, due to CAD (HCC) 12/26/2024   Acute exacerbation of CHF (congestive heart failure) (HCC) 12/26/2024   Acute on chronic HFrEF (heart failure with reduced ejection fraction) (HCC) 08/18/2024   Acute respiratory failure with hypoxia (HCC) 07/16/2024   Hyponatremia 07/16/2024   Acute respiratory failure (HCC) 04/01/2024   History of CAD (coronary artery disease) 12/15/2023   Hx of hypokalemia 12/15/2023   Respiratory distress 12/15/2023   Chronic respiratory failure with hypoxia and  hypercapnia (HCC) 10/11/2023   Acute on chronic systolic CHF (congestive heart failure) (HCC) 09/25/2023   Acute kidney injury superimposed on chronic kidney disease 08/04/2023   Acute on chronic hypoxic respiratory failure (HCC) 08/03/2023   Trichomonas infection 04/19/2023   Presence of heart assist device (HCC) 04/11/2023   COPD with acute exacerbation  (HCC) 03/01/2023   CAD (coronary artery disease) 03/01/2023   Acute hypoxic respiratory failure (HCC) 11/14/2022   Sepsis (HCC) 11/14/2022   Influenza A with pneumonia 11/14/2022   Elevated troponin 11/14/2022   History of lung cancer 11/14/2022   Acute combined systolic and diastolic CHF, NYHA class 4 (HCC) 07/09/2022   Malnutrition of moderate degree 06/20/2022   Lactic acidosis 04/12/2022   Prolonged QT interval 04/09/2022   Hypotension 04/09/2022   Chronic systolic heart failure (HCC) 12/11/2021   CHF exacerbation (HCC) 12/10/2021   Renal infarct 12/10/2021   COPD exacerbation (HCC) 09/29/2021   Mixed diabetic hyperlipidemia associated with type 2 diabetes mellitus (HCC) 09/29/2021   CKD stage 3b, GFR 30-44 ml/min (HCC) 09/29/2021   Unstable angina (HCC) 02/09/2021   Hemoptysis 10/20/2020   Non-insulin  dependent type 2 diabetes mellitus (HCC) 10/20/2020   Acute on chronic combined systolic and diastolic CHF (congestive heart failure) (HCC) 05/21/2020   Ischemic cardiomyopathy 04/12/2020   History of implantable cardioverter-defibrillator (ICD) placement 04/12/2020   NSTEMI (non-ST elevated myocardial infarction) (HCC) 02/12/2019   CHF (congestive heart failure) (HCC) 11/24/2018   Post-operative pain 09/10/2018   COPD (chronic obstructive pulmonary disease) (HCC) 09/04/2018   Recurrent pleural effusion on right 05/05/2018   Acute on chronic respiratory failure with hypoxia (HCC) 05/05/2018   Chronic hypoxic respiratory failure (HCC)    Asthma    Anxiety    Allergy    HCAP (healthcare-associated pneumonia) 03/15/2015   Chest pain 03/15/2015   Small cell lung cancer (HCC) 03/16/2014   Protein-calorie malnutrition, severe 03/14/2014   PAF (paroxysmal atrial fibrillation) (HCC) 03/12/2014   Essential hypertension 05/07/2012   PCP:  Tanda Bleacher, MD Pharmacy:   [id number] - [place], [place] - [address] [place] [address] Phone:  [phone] Fax: [phone]  [facility] [address] [place] [address] Phone: [phone] Fax: [phone]     Social Drivers of Health (SDOH) Social History: SDOH Screenings   Food Insecurity: No Food Insecurity (12/29/2024)  Housing: Low Risk (12/29/2024)  Transportation Needs: No Transportation Needs (12/29/2024)  Recent Concern: Transportation Needs - Unmet Transportation Needs (11/17/2024)  Utilities: Not At Risk (12/29/2024)  Alcohol Screen: Low Risk (08/14/2024)  Depression (PHQ2-9): Low Risk (07/22/2024)  Physical Activity: Insufficiently Active (08/14/2024)  Social Connections: Unknown (12/29/2024)  Tobacco Use: Medium Risk (12/26/2024)   SDOH Interventions:     Readmission Risk Interventions    12/29/2024    1:44 PM 08/21/2024   12:01 PM 02/18/2024    3:51 PM  Readmission Risk Prevention Plan  Transportation Screening Complete Complete Complete  Medication Review Oceanographer) Complete Complete Complete  PCP or Specialist appointment within 3-5 days of discharge  Complete Complete  HRI or Home Care Consult Complete Patient refused Complete  Palliative Care Screening Not Applicable Patient Refused Not Applicable  Skilled Nursing Facility Not Applicable Not Applicable Not Applicable

## 2024-12-29 NOTE — Plan of Care (Signed)
" °  Problem: Education: Goal: Ability to describe self-care measures that may prevent or decrease complications (Diabetes Survival Skills Education) will improve Outcome: Progressing   Problem: Coping: Goal: Ability to adjust to condition or change in health will improve Outcome: Progressing   Problem: Fluid Volume: Goal: Ability to maintain a balanced intake and output will improve Outcome: Progressing   Problem: Health Behavior/Discharge Planning: Goal: Ability to manage health-related needs will improve Outcome: Progressing   Problem: Metabolic: Goal: Ability to maintain appropriate glucose levels will improve Outcome: Progressing   Problem: Skin Integrity: Goal: Risk for impaired skin integrity will decrease Outcome: Progressing   Problem: Tissue Perfusion: Goal: Adequacy of tissue perfusion will improve Outcome: Progressing   Problem: Education: Goal: Knowledge of General Education information will improve Description: Including pain rating scale, medication(s)/side effects and non-pharmacologic comfort measures Outcome: Progressing   Problem: Health Behavior/Discharge Planning: Goal: Ability to manage health-related needs will improve Outcome: Progressing   Problem: Clinical Measurements: Goal: Will remain free from infection Outcome: Progressing   Problem: Coping: Goal: Level of anxiety will decrease Outcome: Progressing   Problem: Elimination: Goal: Will not experience complications related to bowel motility Outcome: Progressing Goal: Will not experience complications related to urinary retention Outcome: Progressing   Problem: Pain Managment: Goal: General experience of comfort will improve and/or be controlled Outcome: Progressing   Problem: Safety: Goal: Ability to remain free from injury will improve Outcome: Progressing   Problem: Education: Goal: Ability to verbalize understanding of medication therapies will improve Outcome: Progressing    Problem: Activity: Goal: Capacity to carry out activities will improve Outcome: Progressing   Problem: Cardiac: Goal: Ability to achieve and maintain adequate cardiopulmonary perfusion will improve Outcome: Progressing   Problem: Education: Goal: Knowledge of disease or condition will improve Outcome: Progressing Goal: Knowledge of the prescribed therapeutic regimen will improve Outcome: Progressing   Problem: Respiratory: Goal: Ability to maintain a clear airway will improve Outcome: Progressing Goal: Levels of oxygenation will improve Outcome: Progressing Goal: Ability to maintain adequate ventilation will improve Outcome: Progressing   "

## 2024-12-29 NOTE — TOC Initial Note (Addendum)
 Transition of Care [facility]) - Initial/Assessment Note    Patient Details  Name: Brittany [name]  MRN: [id number] Date of Birth: 1961/05/28  Transition of Care [facility]) CM/SW Contact:    Waddell Barnie Rama, RN Phone Number: 12/29/2024, 1:56 PM  Clinical Narrative:                 From home alone, has PCP and insurance on file, states has no HH services in place at this time , has home oxygen 2 liters with [employer], cpap/bipap, rollator and a scale at home.  States family member  (sister) will transport them home at [employer] and family is support system, states [facility] [address], ok with TOC filling meds.  Pta self ambulatory, uses rollator sometimes. She does not have a BP cuff, may ned script for bp cuff at dc.  ICM will continue to follow. Await pt eval.  Expected Discharge Plan: Home w Home Health Services Barriers to Discharge: Continued Medical Work up   Patient Goals and CMS Choice Patient states their goals for this hospitalization and ongoing recovery are:: return home CMS Medicare.gov Compare Post Acute Care list provided to:: Patient Choice offered to / list presented to : NA [place] ownership interest in [facility].provided to:: Patient    Expected Discharge Plan and Services In-house Referral: NA Discharge Planning Services: CM Consult Post Acute Care Choice: NA Living arrangements for the past 2 months: Hotel/Motel                 DME Arranged: N/A DME Agency: NA       HH Arranged: NA          Prior Living Arrangements/Services Living arrangements for the past 2 months: Hotel/Motel Lives with:: Self Patient language and need for interpreter reviewed:: Yes Do you feel safe going back to the place where you live?: Yes      Need for Family Participation in Patient Care: Yes (Comment) Care giver support system in place?: No (comment) Current home services: DME (oxygen 2 liters , [employer], cpap/bipap, rollator, scale) Criminal  Activity/Legal Involvement Pertinent to Current Situation/Hospitalization: No - Comment as needed  Activities of Daily Living   ADL Screening (condition at time of admission) Independently performs ADLs?: Yes (appropriate for developmental age) Is the patient deaf or have difficulty hearing?: No Does the patient have difficulty seeing, even when wearing glasses/contacts?: No Does the patient have difficulty concentrating, remembering, or making decisions?: No  Permission Sought/Granted Permission sought to share information with : Case Manager Permission granted to share information with : Yes, Verbal Permission Granted              Emotional Assessment Appearance:: Appears stated age Attitude/Demeanor/Rapport: Engaged Affect (typically observed): Appropriate Orientation: : Oriented to Self, Oriented to Place, Oriented to  Time, Oriented to Situation Alcohol / Substance Use: Not Applicable Psych Involvement: No (comment)  Admission diagnosis:  COPD exacerbation (HCC) [J44.1] Acute exacerbation of CHF (congestive heart failure) (HCC) [I50.9] Acute on chronic systolic congestive heart failure (HCC) [I50.23] Acute combined systolic and diastolic CHF, NYHA class 4 (HCC) [I50.41] Patient Active Problem List   Diagnosis Date Noted   Long term (current) use of anticoagulants 12/27/2024   Heart failure, diastolic, due to CAD (HCC) 12/26/2024   Acute exacerbation of CHF (congestive heart failure) (HCC) 12/26/2024   Acute on chronic HFrEF (heart failure with reduced ejection fraction) (HCC) 08/18/2024   Acute respiratory failure with hypoxia (HCC) 07/16/2024   Hyponatremia 07/16/2024  Acute respiratory failure (HCC) 04/01/2024   History of CAD (coronary artery disease) 12/15/2023   Hx of hypokalemia 12/15/2023   Respiratory distress 12/15/2023   Chronic respiratory failure with hypoxia and hypercapnia (HCC) 10/11/2023   Acute on chronic systolic CHF (congestive heart failure) (HCC)  09/25/2023   Acute kidney injury superimposed on chronic kidney disease 08/04/2023   Acute on chronic hypoxic respiratory failure (HCC) 08/03/2023   Trichomonas infection 04/19/2023   Presence of heart assist device (HCC) 04/11/2023   COPD with acute exacerbation (HCC) 03/01/2023   CAD (coronary artery disease) 03/01/2023   Acute hypoxic respiratory failure (HCC) 11/14/2022   Sepsis (HCC) 11/14/2022   Influenza A with pneumonia 11/14/2022   Elevated troponin 11/14/2022   History of lung cancer 11/14/2022   Acute combined systolic and diastolic CHF, NYHA class 4 (HCC) 07/09/2022   Malnutrition of moderate degree 06/20/2022   Lactic acidosis 04/12/2022   Prolonged QT interval 04/09/2022   Hypotension 04/09/2022   Chronic systolic heart failure (HCC) 12/11/2021   CHF exacerbation (HCC) 12/10/2021   Renal infarct 12/10/2021   COPD exacerbation (HCC) 09/29/2021   Mixed diabetic hyperlipidemia associated with type 2 diabetes mellitus (HCC) 09/29/2021   CKD stage 3b, GFR 30-44 ml/min (HCC) 09/29/2021   Unstable angina (HCC) 02/09/2021   Hemoptysis 10/20/2020   Non-insulin  dependent type 2 diabetes mellitus (HCC) 10/20/2020   Acute on chronic combined systolic and diastolic CHF (congestive heart failure) (HCC) 05/21/2020   Ischemic cardiomyopathy 04/12/2020   History of implantable cardioverter-defibrillator (ICD) placement 04/12/2020   NSTEMI (non-ST elevated myocardial infarction) (HCC) 02/12/2019   CHF (congestive heart failure) (HCC) 11/24/2018   Post-operative pain 09/10/2018   COPD (chronic obstructive pulmonary disease) (HCC) 09/04/2018   Recurrent pleural effusion on right 05/05/2018   Acute on chronic respiratory failure with hypoxia (HCC) 05/05/2018   Chronic hypoxic respiratory failure (HCC)    Asthma    Anxiety    Allergy    HCAP (healthcare-associated pneumonia) 03/15/2015   Chest pain 03/15/2015   Small cell lung cancer (HCC) 03/16/2014   Protein-calorie malnutrition,  severe 03/14/2014   PAF (paroxysmal atrial fibrillation) (HCC) 03/12/2014   Essential hypertension 05/07/2012   PCP:  Tanda Bleacher, MD Pharmacy:   [id number] [place], [place] - [address] [place] [place] [address] Phone: [phone] Fax: [phone]  [facility] [address] [place] [address] Phone: [phone] Fax: [phone]     Social Drivers of Health (SDOH) Social History: SDOH Screenings   Food Insecurity: No Food Insecurity (12/29/2024)  Housing: Low Risk (12/29/2024)  Transportation Needs: No Transportation Needs (12/29/2024)  Recent Concern: Transportation Needs - Unmet Transportation Needs (11/17/2024)  Utilities: Not At Risk (12/29/2024)  Alcohol Screen: Low Risk (08/14/2024)  Depression (PHQ2-9): Low Risk (07/22/2024)  Physical Activity: Insufficiently Active (08/14/2024)  Social Connections: Unknown (12/29/2024)  Tobacco Use: Medium Risk (12/26/2024)   SDOH Interventions:     Readmission Risk Interventions    12/29/2024    1:44 PM 08/21/2024   12:01 PM 02/18/2024    3:51 PM  Readmission Risk Prevention Plan  Transportation Screening Complete Complete Complete  Medication Review Oceanographer) Complete Complete Complete  PCP or Specialist appointment within 3-5 days of discharge Complete Complete Complete  HRI or Home Care Consult Complete Patient refused Complete  Palliative Care Screening Not Applicable Patient Refused Not Applicable  Skilled Nursing Facility Not Applicable Not Applicable Not Applicable

## 2024-12-29 NOTE — Progress Notes (Signed)
 Heart Failure Navigator Progress Note  Assessed for Heart & Vascular TOC clinic readiness.  Patient does not meet criteria due to already established with AHF clinic, patient of Dr. Bensimhon.   Will sign off.   Duwaine Plant, PharmD, BCPS Heart Failure Stewardship Pharmacist Phone [phone]

## 2024-12-29 NOTE — Progress Notes (Addendum)
 "    Advanced Heart Failure Rounding Note  AHF Cardiologist: Dr. Cherrie  Patient Profile   Brittany [name]  is a 64 y.o. female with a history of PAF, CAD s/p emergent CABG in 2019 and DES in 2020, R renal infarct in 2022,  previous smoker quit in 2015, hypertension, previous small cell lung cancer treated with chemo, chest XRT and prophylactic brain radiation in 2015, chronic systolic HF EF ~25% and COPD.   Admitted with acute on chronic respiratory failure 2/2 a/c CHF w/ low output and ACOPDE.   Significant events:   1/18: Lactic acid up to 3.2. Started on inotropic support with DBA  Subjective:    Co-ox 77% on 2.5 DBA.  Remains on lasix  gtt at 20/hr + 5 mg metolazone  BID. CVP < 5.   Feels much better today.  No longer wheezing. Breathing nonlabored.   Objective:   Weight Range: 36.7 kg Body mass index is 16.34 kg/m.   Vital Signs:   Temp:  [97.9 F (36.6 C)-98.6 F (37 C)] 98.6 F (37 C) (01/19 0300) Pulse Rate:  [86-104] 95 (01/19 0300) Resp:  [15-20] 16 (01/19 0300) BP: (87-99)/(48-56) 87/54 (01/19 0400) SpO2:  [98 %-100 %] 99 % (01/19 0300) FiO2 (%):  [28 %-30 %] 28 % (01/18 1951) Weight:  [36.7 kg] 36.7 kg (01/19 0300) Last BM Date : 12/26/24  Weight change: Filed Weights   12/27/24 1742 12/28/24 0332 12/29/24 0300  Weight: 37.9 kg 43.4 kg 36.7 kg    Intake/Output:   Intake/Output Summary (Last 24 hours) at 12/29/2024 0713 Last data filed at 12/29/2024 0600 Gross per 24 hour  Intake 500.55 ml  Output 2750 ml  Net -2249.45 ml     Physical Exam   General:  Chronically ill appearing Cor: Regular rate & rhythm. No murmurs. No JVD.   Lungs: Diminished Extremities: no edema   Telemetry   SR/ST 90s-100s  Labs   CBC Recent Labs    12/26/24 2003 12/26/24 2038 12/27/24 0447 12/28/24 0239  WBC 7.5  --  7.5 9.3  NEUTROABS 5.6  --   --   --   HGB 15.0   < > 13.9 14.7  HCT 46.5*   < > 44.3 44.8  MCV 91.9  --  91.5 88.5  PLT 208  --   197 228   < > = values in this interval not displayed.   Basic Metabolic Panel Recent Labs    98/81/73 0239 12/29/24 0510  NA 139 135  K 3.3* 3.0*  CL 95* 85*  CO2 27 36*  GLUCOSE 87 156*  BUN 52* 63*  CREATININE 1.49* 1.73*  CALCIUM  10.3 10.9*  MG  --  2.5*   Liver Function Tests Recent Labs    12/26/24 2003 12/27/24 0447  AST 36 32  ALT 29 28  ALKPHOS 93 85  BILITOT 0.8 0.9  PROT 7.1 7.2  ALBUMIN  4.2 4.5   No results for input(s): LIPASE, AMYLASE in the last 72 hours. Cardiac Enzymes No results for input(s): CKTOTAL, CKMB, CKMBINDEX, TROPONINI in the last 72 hours.  BNP: BNP (last 3 results) Recent Labs    09/16/24 1115 10/22/24 1526 11/17/24 1618  BNP 1,596.2* 1,589.7* 1,898.1*    ProBNP (last 3 results) Recent Labs    12/26/24 2003  PROBNP 12,902.0*     D-Dimer No results for input(s): DDIMER in the last 72 hours. Hemoglobin A1C No results for input(s): HGBA1C in the last 72 hours. Fasting Lipid Panel No results  for input(s): CHOL, HDL, LDLCALC, TRIG, CHOLHDL, LDLDIRECT in the last 72 hours. Medications:   Scheduled Medications:  acetaminophen   650 mg Oral Once   apixaban   2.5 mg Oral BID   atorvastatin   80 mg Oral Daily   benzonatate   100 mg Oral TID   Chlorhexidine  Gluconate Cloth  6 each Topical Daily   dapagliflozin  propanediol  10 mg Oral Daily   digoxin   0.0625 mg Oral Daily   docusate sodium   100 mg Oral BID   insulin  aspart  0-5 Units Subcutaneous QHS   insulin  aspart  0-6 Units Subcutaneous TID WC   ipratropium-albuterol   3 mL Nebulization Q6H   ivabradine   7.5 mg Oral BID WC   metolazone   5 mg Oral BID   pantoprazole   40 mg Oral Q0600   sodium chloride  flush  10-40 mL Intracatheter Q12H   sodium chloride  flush  3 mL Intravenous Q12H   sodium chloride  flush  3 mL Intravenous Q12H   spironolactone   25 mg Oral Daily    Infusions:  DOBUTamine  2.5 mcg/kg/min (12/28/24 1138)   furosemide  (LASIX ) 200  mg in dextrose  5 % 100 mL (2 mg/mL) infusion 20 mg/hr (12/29/24 0105)    PRN Medications: acetaminophen  **OR** acetaminophen , guaiFENesin -dextromethorphan , ipratropium-albuterol , sodium chloride  flush, sodium chloride  flush  Assessment/Plan   1. Acute on Chronic Systolic Heart Failure, end-stage - HF felt to be end-stage. EF has been 20-25% for the last 5 years.  - S/p Medtronic ICD and Barostim - Last Echo 4/25: EF < 20%, RV normal  - Numerous hospitalizations over last 2 years  - Now readmitted w/ NYHA IV symptoms and volume overload c/b low output with lactic acidosis and cardio renal syndrome - Co-ox 77% on 2.5 DBA. Wean to 1 mcg/kg/min today, if stable tomorrow will stop. - CVP < 5. Stop lasix  gtt. Start po diuretic tomorrow, on 100 Torsemide  BID at home - continue Farxiga  10 mg daily  - continue ivabradine  7.5 mg bid - continue digoxin  - continue spiro 25 mg daily - she is not a candidate for advanced therapies. We discussed consideration of palliative support previous hospitalization but pt not yet ready for this.   2. Hypokalemia - a chronic problem for her - K 3.0. Will supp - continue Spiro 25 mg daily    3. A/c Hypoxic Respiratory Failure - multifactorial 2/2 a/c CHF and ACOPDE - diuresis per above - continue O2 - on COPD treatment per IM   3. CKD Stage IIIb  - Creatinine baseline ~ 1.4-1.7 - SCr higher, 1.73 today.  - Continue SGLT2i - Hold diuretics   4. PAF  - In NSR - Continue Eliquis  2.5 mg bid. Reduced dose (weight/creatinine).   5. CAD - History of CABG x 2 2019 - s/p DES ostial ramus extending to left main (2020) - no s/s angina. Hstrop ok - Continue atorvastatin  80 mg daily - No ASA with need for AC   6. H/o SCLC - Completed treatment 2015.  - CT chest (5/24) soft tissue thickening along mediastinum but no discrete mass    7. H/o Renal Infarct 2022 - Continue Eliquis . No bleeding   8. DNR/DNI  Length of Stay: 3  FINCH, LINDSAY N, PA-C   12/29/2024, 7:13 AM  Advanced Heart Failure Team Pager [phone] (M-F; 7a - 5p)   Please visit [email or web address]: For overnight coverage please call cardiology fellow first. If fellow not available call Shock/ECMO MD on call.  For ECMO / Mechanical Support (Impella, IABP, LVAD) issues  call Shock / ECMO MD on call.    Patient seen and examined with the above-signed Advanced Practice Provider and/or Housestaff. I personally reviewed laboratory data, imaging studies and relevant notes. I independently examined the patient and formulated the important aspects of the plan. I have edited the note to reflect any of my changes or salient points. I have personally discussed the plan with the patient and/or family.  Remains on DBA and IV lasix  gtt. Co-ox 77% CVP 3  Feels much better. Lactic acidosis has resolved. Diuresed well.   General: Cachetic frail. No resp difficulty HEENT: normal Neck: supple. no JVD.  Cor: Regular rate & rhythm. No rubs, gallops or murmurs. Lungs: decreased no wheezing Abdomen: soft, nontender, nondistended.Good bowel sounds. Extremities: no cyanosis, clubbing, rash, edema Neuro: alert & orientedx3, cranial nerves grossly intact. moves all 4 extremities w/o difficulty. Affect pleasant  Volume and respiratory status much improved. Can stop IV lasix . Wean DBA to 1 and follow co-ox.   Toribio Fuel, MD  12:03 PM   "

## 2024-12-29 NOTE — Progress Notes (Signed)
 " PROGRESS NOTE    Brittany [name]   FMW:[id number] DOB: 09/18/1961 DOA: 12/26/2024 PCP: Tanda Bleacher, MD  64/F, frail with end-stage chronic systolic CHF, EF 79%, [employer] ICD, chronic hypoxic respiratory failure, CABG, paroxysmal A-fib, CKD 3B, COPD, prior history of lung cancer and type II DM, presented to the ED overnight with worsening shortness of breath and cough, when EMS arrived she was hypoxic in the low 70s on 3 L O2 with respiratory distress, placed on BiPAP and transported to the ED. - Tachypneic, hypoxic, proBNP 12.9 K, creatinine 1.07, troponin 44, WBC 7, hemoglobin 15, VBG without significant hypercarbia, chest x-ray with interstitial edema and pleural effusions - Admitted, cards following, started on dobutamine , Lasix  gtt.  Subjective: - Feels better overall, breathing is improving  Assessment and Plan:  Acute on chronic systolic CHF, BiV failure End-stage heart failure Low output CHF - Followed by advanced heart failure clinic, EF has been 20-25% for approximately 5 years, - Last echo 1/25 noted EF less than 20% with moderate to severely reduced RV  - Frequent hospitalizations, on high-dose torsemide  at baseline, reports compliance - Concern for low output, worsening lactic acidosis, cards following, started dobutamine  1/18 - GDMT has been limited by chronic hypotension -CVP down, Lasix  gtt. discontinued today - on digoxin , ivabradine , Farxiga , Aldactone  - May need palliative care eval this admission - Increase activity, PT eval today   Acute on chronic hypoxic respiratory failure-secondary to CHF COPD/chronic respiratory failure on 2 L O2 at baseline -Has underlying advanced COPD as well, respiratory virus panel is negative -Continue DuoNebs, DC azithromycin    Elevated troponin-secondary to demand ischemia - Type II MI from demand in the setting of CHF   Paroxysmal atrial fibrillation -Continue Eliquis  2.5 mg twice daily   History of CAD status post  CABG x 2 - Continue Eliquis  and statin.   History of lung cancer status post chemotherapy treatment completed 2015   CKD stage IIIb -Stable renal function continue to monitor   Non-insulin -dependent DM type II -Continue Farxiga  and sliding scale insulin  and mealtime coverage.   GERD - Continue Protonix .   Hypokalemia -replete   History of renal infraction - Continue Eliquis      DVT prophylaxis:  Eliquis , SCD TED hose  Code Status:  DNR/DNI Family Communication: No family at bedside now Disposition Plan: Home in improved  Consultants:    Procedures:   Antimicrobials:    Objective: Vitals:   12/29/24 0300 12/29/24 0400 12/29/24 0721 12/29/24 0757  BP: (!) 96/52 (!) 87/54 90/71   Pulse: 95  99   Resp: 16  16   Temp: 98.6 F (37 C)  98.3 F (36.8 C)   TempSrc: Oral  Oral   SpO2: 99%  100% 97%  Weight: 36.7 kg     Height: 4' 11 (1.499 m)       Intake/Output Summary (Last 24 hours) at 12/29/2024 0819 Last data filed at 12/29/2024 0600 Gross per 24 hour  Intake 500.55 ml  Output 2750 ml  Net -2249.45 ml   Filed Weights   12/27/24 1742 12/28/24 0332 12/29/24 0300  Weight: 37.9 kg 43.4 kg 36.7 kg    Examination:  General exam: Frail chronically ill cachectic, AO x 3, no distress HEENT:no JVD Respiratory system: Decreased at the bases Cardiovascular system: S1 & S2 heard, RRR.  Abd: nondistended, soft and nontender.Normal bowel sounds heard. Central nervous system: Alert and oriented. No focal neurological deficits. Extremities: Trace edema Skin: No rashes Psychiatry:  Mood & affect appropriate.  Data Reviewed:   CBC: Recent Labs  Lab 12/26/24 2003 12/26/24 2038 12/27/24 0447 12/28/24 0239  WBC 7.5  --  7.5 9.3  NEUTROABS 5.6  --   --   --   HGB 15.0 15.6* 13.9 14.7  HCT 46.5* 46.0 44.3 44.8  MCV 91.9  --  91.5 88.5  PLT 208  --  197 228   Basic Metabolic Panel: Recent Labs  Lab 12/26/24 2003 12/26/24 2038 12/27/24 0447  12/28/24 0239 12/29/24 0510  NA 137 139 134* 139 135  K 4.1 3.8 4.3 3.3* 3.0*  CL 100  --  99 95* 85*  CO2 22  --  19* 27 36*  GLUCOSE 148*  --  288* 87 156*  BUN 34*  --  39* 52* 63*  CREATININE 1.07*  --  1.25* 1.49* 1.73*  CALCIUM  10.0  --  10.0 10.3 10.9*  MG  --   --   --   --  2.5*   GFR: Estimated Creatinine Clearance: 19 mL/min (A) (by C-G formula based on SCr of 1.73 mg/dL (H)). Liver Function Tests: Recent Labs  Lab 12/26/24 2003 12/27/24 0447  AST 36 32  ALT 29 28  ALKPHOS 93 85  BILITOT 0.8 0.9  PROT 7.1 7.2  ALBUMIN  4.2 4.5   No results for input(s): LIPASE, AMYLASE in the last 168 hours. No results for input(s): AMMONIA in the last 168 hours. Coagulation Profile: No results for input(s): INR, PROTIME in the last 168 hours. Cardiac Enzymes: No results for input(s): CKTOTAL, CKMB, CKMBINDEX, TROPONINI in the last 168 hours. BNP (last 3 results) Recent Labs    12/26/24 2003  PROBNP 12,902.0*   HbA1C: No results for input(s): HGBA1C in the last 72 hours. CBG: Recent Labs  Lab 12/28/24 0612 12/28/24 1200 12/28/24 1600 12/28/24 2119 12/29/24 0510  GLUCAP 116* 174* 87 163* 184*   Lipid Profile: No results for input(s): CHOL, HDL, LDLCALC, TRIG, CHOLHDL, LDLDIRECT in the last 72 hours. Thyroid  Function Tests: No results for input(s): TSH, T4TOTAL, FREET4, T3FREE, THYROIDAB in the last 72 hours. Anemia Panel: No results for input(s): VITAMINB12, FOLATE, FERRITIN, TIBC, IRON, RETICCTPCT in the last 72 hours. Urine analysis:    Component Value Date/Time   COLORURINE YELLOW 07/17/2024 1500   APPEARANCEUR CLEAR 07/17/2024 1500   LABSPEC 1.007 07/17/2024 1500   PHURINE 6.0 07/17/2024 1500   GLUCOSEU >=500 (A) 07/17/2024 1500   HGBUR NEGATIVE 07/17/2024 1500   BILIRUBINUR NEGATIVE 07/17/2024 1500   KETONESUR NEGATIVE 07/17/2024 1500   PROTEINUR NEGATIVE 07/17/2024 1500   UROBILINOGEN 1.0  03/15/2015 1117   NITRITE NEGATIVE 07/17/2024 1500   LEUKOCYTESUR NEGATIVE 07/17/2024 1500   Sepsis Labs: @LABRCNTIP (procalcitonin:4,lacticidven:4)  ) Recent Results (from the past 240 hours)  Respiratory (~20 pathogens) panel by PCR     Status: None   Collection Time: 12/26/24 10:29 PM   Specimen: Nasopharyngeal Swab; Respiratory  Result Value Ref Range Status   Adenovirus NOT DETECTED NOT DETECTED Final   Coronavirus 229E NOT DETECTED NOT DETECTED Final    Comment: (NOTE) The Coronavirus on the Respiratory Panel, DOES NOT test for the novel  Coronavirus (2019 nCoV)    Coronavirus HKU1 NOT DETECTED NOT DETECTED Final   Coronavirus NL63 NOT DETECTED NOT DETECTED Final   Coronavirus OC43 NOT DETECTED NOT DETECTED Final   Metapneumovirus NOT DETECTED NOT DETECTED Final   Rhinovirus / Enterovirus NOT DETECTED NOT DETECTED Final   Influenza A NOT DETECTED NOT DETECTED Final   Influenza B  NOT DETECTED NOT DETECTED Final   Parainfluenza Virus 1 NOT DETECTED NOT DETECTED Final   Parainfluenza Virus 2 NOT DETECTED NOT DETECTED Final   Parainfluenza Virus 3 NOT DETECTED NOT DETECTED Final   Parainfluenza Virus 4 NOT DETECTED NOT DETECTED Final   Respiratory Syncytial Virus NOT DETECTED NOT DETECTED Final   Bordetella pertussis NOT DETECTED NOT DETECTED Final   Bordetella Parapertussis NOT DETECTED NOT DETECTED Final   Chlamydophila pneumoniae NOT DETECTED NOT DETECTED Final   Mycoplasma pneumoniae NOT DETECTED NOT DETECTED Final    Comment: Performed at [facility], [address] [place], [place] [address]     Radiology Studies: ECHOCARDIOGRAM COMPLETE Result Date: 12/28/2024    ECHOCARDIOGRAM REPORT   Patient Name:   Brittany [name]  Date of Exam: 12/28/2024 Medical Rec #:  [id number]               Height:       59.0 in Accession #:    [id number]              Weight:       95.7 lb Date of Birth:  09-07-1961               BSA:          1.348 m Patient Age:    63 years                 BP:           103/57 mmHg Patient Gender: F                       HR:           106 bpm. Exam Location:  Inpatient Procedure: 2D Echo, Cardiac Doppler, Color Doppler and Intracardiac            Opacification Agent (Both Spectral and Color Flow Doppler were            utilized during procedure). Indications:    CHF - acute systolic  History:        Patient has prior history of Echocardiogram examinations, most                 recent 11/26/2023. CHF and Cardiomyopathy, Previous Myocardial                 Infarction and CAD, COPD; Risk Factors:Hypertension, Diabetes                 and Dyslipidemia.  Sonographer:    Odella Brewster Referring Phys: [id number] SUBRINA SUNDIL IMPRESSIONS  1. Left ventricular ejection fraction, by estimation, is <20%. The left ventricle has severely decreased function. The left ventricle has no regional wall motion abnormalities. The left ventricular internal cavity size was severely dilated. Indeterminate diastolic filling due to E-A fusion.  2. Right ventricular systolic function is mildly reduced. The right ventricular size is mildly enlarged. There is moderately elevated pulmonary artery systolic pressure. The estimated right ventricular systolic pressure is 52.1 mmHg.  3. Left atrial size was severely dilated.  4. Right atrial size was mildly dilated.  5. The mitral valve is grossly normal. Moderate to severe mitral valve regurgitation. No evidence of mitral stenosis.  6. Tricuspid valve regurgitation is mild to moderate.  7. The aortic valve is tricuspid. There is mild calcification of the aortic valve. Aortic valve regurgitation is severe. Aortic valve sclerosis is present, with no evidence of aortic valve stenosis.  8. The  inferior vena cava is normal in size with <50% respiratory variability, suggesting right atrial pressure of 8 mmHg. Comparison(s): No significant change from prior study. Changes from prior study are noted. Moderate to severe mitral regurgitation is now  present. Severe LV dysfunction and severe AI remain present. Conclusion(s)/Recommendation(s): No left ventricular mural or apical thrombus/thrombi. FINDINGS  Left Ventricle: Left ventricular ejection fraction, by estimation, is <20%. The left ventricle has severely decreased function. The left ventricle has no regional wall motion abnormalities. Definity  contrast agent was given IV to delineate the left ventricular endocardial borders. The left ventricular internal cavity size was severely dilated. There is no left ventricular hypertrophy. Indeterminate diastolic filling due to E-A fusion.  LV Wall Scoring: The entire septum and apex are akinetic. Right Ventricle: The right ventricular size is mildly enlarged. No increase in right ventricular wall thickness. Right ventricular systolic function is mildly reduced. There is moderately elevated pulmonary artery systolic pressure. The tricuspid regurgitant velocity is 3.32 m/s, and with an assumed right atrial pressure of 8 mmHg, the estimated right ventricular systolic pressure is 52.1 mmHg. Left Atrium: Left atrial size was severely dilated. Right Atrium: Right atrial size was mildly dilated. Pericardium: There is no evidence of pericardial effusion. Mitral Valve: The mitral valve is grossly normal. Moderate to severe mitral valve regurgitation. No evidence of mitral valve stenosis. Tricuspid Valve: The tricuspid valve is grossly normal. Tricuspid valve regurgitation is mild to moderate. No evidence of tricuspid stenosis. Aortic Valve: The aortic valve is tricuspid. There is mild calcification of the aortic valve. Aortic valve regurgitation is severe. Aortic regurgitation PHT measures 54 msec. Aortic valve sclerosis is present, with no evidence of aortic valve stenosis. Aortic valve mean gradient measures 7.0 mmHg. Aortic valve peak gradient measures 12.5 mmHg. Aortic valve area, by VTI measures 1.88 cm. Pulmonic Valve: The pulmonic valve was grossly normal. Pulmonic  valve regurgitation is not visualized. No evidence of pulmonic stenosis. Aorta: The aortic root and ascending aorta are structurally normal, with no evidence of dilitation. Venous: The inferior vena cava is normal in size with less than 50% respiratory variability, suggesting right atrial pressure of 8 mmHg. IAS/Shunts: There is right bowing of the interatrial septum, suggestive of elevated left atrial pressure. The atrial septum is grossly normal. Additional Comments: A device lead is visualized in the right atrium and right ventricle.  LEFT VENTRICLE PLAX 2D LVIDd:         6.40 cm      Diastology LVIDs:         6.00 cm      LV e' medial:    5.44 cm/s LV PW:         1.00 cm      LV E/e' medial:  13.3 LV IVS:        0.50 cm      LV e' lateral:   7.83 cm/s LVOT diam:     1.90 cm      LV E/e' lateral: 9.2 LV SV:         45 LV SV Index:   34 LVOT Area:     2.84 cm LV IVRT:       70 msec  LV Volumes (MOD) LV vol d, MOD A2C: 189.0 ml LV vol d, MOD A4C: 163.5 ml LV vol s, MOD A2C: 170.0 ml LV vol s, MOD A4C: 148.0 ml LV SV MOD A2C:     19.0 ml LV SV MOD A4C:     163.5 ml LV SV  MOD BP:      15.1 ml RIGHT VENTRICLE            IVC RV S prime:     7.62 cm/s  IVC diam: 1.60 cm TAPSE (M-mode): 1.2 cm                            PULMONARY VEINS                            Diastolic Velocity: 82.30 cm/s                            S/D Velocity:       0.30                            Systolic Velocity:  24.10 cm/s LEFT ATRIUM             Index        RIGHT ATRIUM          Index LA diam:        4.40 cm 3.26 cm/m   RA Area:     9.50 cm LA Vol (A2C):   63.9 ml 47.40 ml/m  RA Volume:   20.90 ml 15.50 ml/m LA Vol (A4C):   50.1 ml 37.16 ml/m LA Biplane Vol: 59.5 ml 44.13 ml/m  AORTIC VALVE                     PULMONIC VALVE AV Area (Vmax):    2.03 cm      PV Vmax:       0.88 m/s AV Area (Vmean):   1.68 cm      PV Peak grad:  3.1 mmHg AV Area (VTI):     1.88 cm AV Vmax:           177.00 cm/s AV Vmean:          121.000 cm/s AV VTI:             0.240 m AV Peak Grad:      12.5 mmHg AV Mean Grad:      7.0 mmHg LVOT Vmax:         126.50 cm/s LVOT Vmean:        71.850 cm/s LVOT VTI:          0.160 m LVOT/AV VTI ratio: 0.66 AI PHT:            54 msec  AORTA Ao Root diam: 2.80 cm Ao Asc diam:  2.80 cm MITRAL VALVE               TRICUSPID VALVE MV Area (PHT): 6.60 cm    TR Peak grad:   44.1 mmHg MV Decel Time: 115 msec    TR Vmax:        332.00 cm/s MV E velocity: 72.30 cm/s MV A velocity: 47.20 cm/s  SHUNTS MV E/A ratio:  1.53        Systemic VTI:  0.16 m                            Systemic Diam: 1.90 cm Darryle Decent MD Electronically signed by Darryle Decent MD Signature Date/Time: 12/28/2024/4:23:51 PM    Final    US  EKG  SITE RITE Result Date: 12/28/2024 If Site Rite image not attached, placement could not be confirmed due to current cardiac rhythm.  US  EKG SITE RITE Result Date: 12/28/2024 If Site Rite image not attached, placement could not be confirmed due to current cardiac rhythm.    Scheduled Meds:  acetaminophen   650 mg Oral Once   apixaban   2.5 mg Oral BID   atorvastatin   80 mg Oral Daily   benzonatate   100 mg Oral TID   Chlorhexidine  Gluconate Cloth  6 each Topical Daily   dapagliflozin  propanediol  10 mg Oral Daily   digoxin   0.0625 mg Oral Daily   docusate sodium   100 mg Oral BID   insulin  aspart  0-5 Units Subcutaneous QHS   insulin  aspart  0-6 Units Subcutaneous TID WC   ipratropium-albuterol   3 mL Nebulization Q6H   ivabradine   7.5 mg Oral BID WC   pantoprazole   40 mg Oral Q0600   potassium chloride   40 mEq Oral Q4H   sodium chloride  flush  10-40 mL Intracatheter Q12H   sodium chloride  flush  3 mL Intravenous Q12H   sodium chloride  flush  3 mL Intravenous Q12H   spironolactone   25 mg Oral Daily   Continuous Infusions:  DOBUTamine  2.5 mcg/kg/min (12/28/24 1138)     LOS: 3 days    Time spent:    Sigurd Pac, MD [facility]   12/29/2024, 8:19 AM    "

## 2024-12-29 NOTE — Progress Notes (Signed)
 Patient refusing mobility this shift. This RN offered multiple times.

## 2024-12-30 DIAGNOSIS — I5041 Acute combined systolic (congestive) and diastolic (congestive) heart failure: Secondary | ICD-10-CM | POA: Diagnosis not present

## 2024-12-30 LAB — BASIC METABOLIC PANEL WITH GFR
Anion gap: 15 (ref 5–15)
BUN: 64 mg/dL — ABNORMAL HIGH (ref 8–23)
CO2: 31 mmol/L (ref 22–32)
Calcium: 9.5 mg/dL (ref 8.9–10.3)
Chloride: 88 mmol/L — ABNORMAL LOW (ref 98–111)
Creatinine, Ser: 1.53 mg/dL — ABNORMAL HIGH (ref 0.44–1.00)
GFR, Estimated: 38 mL/min — ABNORMAL LOW
Glucose, Bld: 150 mg/dL — ABNORMAL HIGH (ref 70–99)
Potassium: 3.2 mmol/L — ABNORMAL LOW (ref 3.5–5.1)
Sodium: 134 mmol/L — ABNORMAL LOW (ref 135–145)

## 2024-12-30 LAB — COOXEMETRY PANEL
Carboxyhemoglobin: 2 % — ABNORMAL HIGH (ref 0.5–1.5)
Carboxyhemoglobin: 1.3 % (ref 0.5–1.5)
Methemoglobin: 0.7 % (ref 0.0–1.5)
Methemoglobin: <0.7 % (ref 0.0–1.5)
O2 Saturation: 75.3 %
O2 Saturation: 76.6 %
Total hemoglobin: 15.6 g/dL (ref 12.0–16.0)
Total hemoglobin: 16.3 g/dL — ABNORMAL HIGH (ref 12.0–16.0)

## 2024-12-30 LAB — GLUCOSE, CAPILLARY
Glucose-Capillary: 149 mg/dL — ABNORMAL HIGH (ref 70–99)
Glucose-Capillary: 176 mg/dL — ABNORMAL HIGH (ref 70–99)
Glucose-Capillary: 269 mg/dL — ABNORMAL HIGH (ref 70–99)
Glucose-Capillary: 78 mg/dL (ref 70–99)

## 2024-12-30 LAB — MAGNESIUM: Magnesium: 2.5 mg/dL — ABNORMAL HIGH (ref 1.7–2.4)

## 2024-12-30 MED ORDER — MIRTAZAPINE 15 MG PO TABS
15.0000 mg | ORAL_TABLET | Freq: Every day | ORAL | Status: DC
  Filled 2024-12-30: qty 1

## 2024-12-30 MED ORDER — TRAMADOL HCL 50 MG PO TABS
50.0000 mg | ORAL_TABLET | Freq: Two times a day (BID) | ORAL | Status: DC | PRN
  Administered 2024-12-30: 50 mg via ORAL
  Filled 2024-12-30: qty 1

## 2024-12-30 MED ORDER — TORSEMIDE 100 MG PO TABS
100.0000 mg | ORAL_TABLET | Freq: Two times a day (BID) | ORAL | Status: DC
  Administered 2024-12-30 – 2024-12-31 (×3): 100 mg via ORAL
  Filled 2024-12-30 (×3): qty 1

## 2024-12-30 MED ORDER — FENTANYL CITRATE (PF) 50 MCG/ML IJ SOSY: 25.0000 ug | PREFILLED_SYRINGE | INTRAMUSCULAR | Status: DC | PRN

## 2024-12-30 MED ORDER — POTASSIUM CHLORIDE 20 MEQ PO PACK
40.0000 meq | PACK | Freq: Two times a day (BID) | ORAL | Status: AC
  Administered 2024-12-30: 40 meq via ORAL
  Filled 2024-12-30 (×2): qty 2

## 2024-12-30 MED ORDER — ENSURE PLUS HIGH PROTEIN PO LIQD
237.0000 mL | Freq: Three times a day (TID) | ORAL | Status: DC
  Administered 2024-12-30 – 2024-12-31 (×2): 237 mL via ORAL

## 2024-12-30 MED ORDER — POTASSIUM CHLORIDE CRYS ER 20 MEQ PO TBCR
40.0000 meq | EXTENDED_RELEASE_TABLET | ORAL | Status: DC
  Administered 2024-12-30: 40 meq via ORAL
  Filled 2024-12-30: qty 2

## 2024-12-30 MED ORDER — IPRATROPIUM-ALBUTEROL 0.5-2.5 (3) MG/3ML IN SOLN: 3.0000 mL | RESPIRATORY_TRACT | Status: DC | PRN

## 2024-12-30 MED ORDER — ONDANSETRON HCL 4 MG/2ML IJ SOLN
4.0000 mg | Freq: Once | INTRAMUSCULAR | Status: DC
  Filled 2024-12-30: qty 2

## 2024-12-30 NOTE — Progress Notes (Signed)
 " PROGRESS NOTE    Brittany [name]   FMW:[id number] DOB: [birth year] DOA: 12/26/2024 PCP: Tanda Bleacher, MD  64/F, frail with end-stage chronic systolic CHF, EF 79%, [employer] ICD, chronic hypoxic respiratory failure, CABG, paroxysmal A-fib, CKD 3B, COPD, prior history of lung cancer and type II DM, presented to the ED overnight with worsening shortness of breath and cough, when EMS arrived she was hypoxic in the low 70s on 3 L O2 with respiratory distress, placed on BiPAP and transported to the ED. - Tachypneic, hypoxic, proBNP 12.9 K, creatinine 1.07, troponin 44, WBC 7, hemoglobin 15, VBG without significant hypercarbia, chest x-ray with interstitial edema and pleural effusions - Admitted, cards following, started on dobutamine , Lasix  gtt.  Subjective: - Feels bad, nauseated, appetite is poor  Assessment and Plan:  Acute on chronic systolic CHF, BiV failure End-stage heart failure Low output CHF - Followed by advanced heart failure clinic, EF has been 20-25% for approximately 5 years, - Last echo 1/25 noted EF less than 20% with moderate to severely reduced RV  - Frequent hospitalizations, on high-dose torsemide  at baseline, reports compliance - Concern for low output, worsening lactic acidosis, cards following, started dobutamine  1/18 - GDMT has been limited by chronic hypotension -CVP down, now off Lasix  drip, dobutamine  discontinued today - on digoxin , ivabradine , Farxiga , Aldactone  - Prognosis appears very poor, has been reluctant to consider hospice in the past, discussed again today - Increase activity, PT eval today   Acute on chronic hypoxic respiratory failure-secondary to CHF COPD/chronic respiratory failure on 2 L O2 at baseline -Has underlying advanced COPD as well, respiratory virus panel is negative -Continue DuoNebs, off ABX   Elevated troponin-secondary to demand ischemia - Type II MI from demand in the setting of CHF   Paroxysmal atrial  fibrillation -Continue Eliquis  2.5 mg twice daily   History of CAD status post CABG x 2 - Continue Eliquis  and statin.   History of lung cancer status post chemotherapy treatment completed 2015   CKD stage IIIb -Stable renal function continue to monitor   Non-insulin -dependent DM type II -Continue Farxiga  and sliding scale insulin  and mealtime coverage.   GERD - Continue Protonix .   Hypokalemia -replete   History of renal infraction - Continue Eliquis      DVT prophylaxis:  Eliquis , SCD TED hose  Code Status:  DNR/DNI Family Communication: No family at bedside now Disposition Plan: Home in 48 hours if stable  Consultants:    Procedures:   Antimicrobials:    Objective: Vitals:   12/30/24 0300 12/30/24 0616 12/30/24 0720 12/30/24 0827  BP: (!) 85/56 (!) 93/56 (!) 96/54   Pulse: 95 89 90 92  Resp: 13 12 (!) 7 14  Temp: 98 F (36.7 C)  97.7 F (36.5 C)   TempSrc: Oral  Oral   SpO2: 100% 99% 98% 99%  Weight: 36.6 kg     Height: 4' 11 (1.499 m)       Intake/Output Summary (Last 24 hours) at 12/30/2024 0948 Last data filed at 12/30/2024 0915 Gross per 24 hour  Intake 765.7 ml  Output 350 ml  Net 415.7 ml   Filed Weights   12/28/24 0332 12/29/24 0300 12/30/24 0300  Weight: 43.4 kg 36.7 kg 36.6 kg    Examination:  General exam: Frail chronically ill cachectic, AO x 3, no distress HEENT:no JVD Respiratory system: Decreased at the bases Cardiovascular system: S1 & S2 heard, RRR.  Abd: nondistended, soft and nontender.Normal bowel sounds heard. Central nervous system: Alert and oriented.  No focal neurological deficits. Extremities: Trace edema Skin: No rashes Psychiatry:  Mood & affect appropriate.     Data Reviewed:   CBC: Recent Labs  Lab 12/26/24 2003 12/26/24 2038 12/27/24 0447 12/28/24 0239  WBC 7.5  --  7.5 9.3  NEUTROABS 5.6  --   --   --   HGB 15.0 15.6* 13.9 14.7  HCT 46.5* 46.0 44.3 44.8  MCV 91.9  --  91.5 88.5  PLT 208  --   197 228   Basic Metabolic Panel: Recent Labs  Lab 12/26/24 2003 12/26/24 2038 12/27/24 0447 12/28/24 0239 12/29/24 0510 12/30/24 0652  NA 137 139 134* 139 135 134*  K 4.1 3.8 4.3 3.3* 3.0* 3.2*  CL 100  --  99 95* 85* 88*  CO2 22  --  19* 27 36* 31  GLUCOSE 148*  --  288* 87 156* 150*  BUN 34*  --  39* 52* 63* 64*  CREATININE 1.07*  --  1.25* 1.49* 1.73* 1.53*  CALCIUM  10.0  --  10.0 10.3 10.9* 9.5  MG  --   --   --   --  2.5* 2.5*   GFR: Estimated Creatinine Clearance: 21.5 mL/min (A) (by C-G formula based on SCr of 1.53 mg/dL (H)). Liver Function Tests: Recent Labs  Lab 12/26/24 2003 12/27/24 0447  AST 36 32  ALT 29 28  ALKPHOS 93 85  BILITOT 0.8 0.9  PROT 7.1 7.2  ALBUMIN  4.2 4.5   No results for input(s): LIPASE, AMYLASE in the last 168 hours. No results for input(s): AMMONIA in the last 168 hours. Coagulation Profile: No results for input(s): INR, PROTIME in the last 168 hours. Cardiac Enzymes: No results for input(s): CKTOTAL, CKMB, CKMBINDEX, TROPONINI in the last 168 hours. BNP (last 3 results) Recent Labs    12/26/24 2003  PROBNP 12,902.0*   HbA1C: No results for input(s): HGBA1C in the last 72 hours. CBG: Recent Labs  Lab 12/29/24 0510 12/29/24 1104 12/29/24 1619 12/29/24 2110 12/30/24 0621  GLUCAP 184* 194* 117* 149* 149*   Lipid Profile: No results for input(s): CHOL, HDL, LDLCALC, TRIG, CHOLHDL, LDLDIRECT in the last 72 hours. Thyroid  Function Tests: No results for input(s): TSH, T4TOTAL, FREET4, T3FREE, THYROIDAB in the last 72 hours. Anemia Panel: No results for input(s): VITAMINB12, FOLATE, FERRITIN, TIBC, IRON, RETICCTPCT in the last 72 hours. Urine analysis:    Component Value Date/Time   COLORURINE YELLOW 07/17/2024 1500   APPEARANCEUR CLEAR 07/17/2024 1500   LABSPEC 1.007 07/17/2024 1500   PHURINE 6.0 07/17/2024 1500   GLUCOSEU >=500 (A) 07/17/2024 1500   HGBUR NEGATIVE  07/17/2024 1500   BILIRUBINUR NEGATIVE 07/17/2024 1500   KETONESUR NEGATIVE 07/17/2024 1500   PROTEINUR NEGATIVE 07/17/2024 1500   UROBILINOGEN 1.0 03/15/2015 1117   NITRITE NEGATIVE 07/17/2024 1500   LEUKOCYTESUR NEGATIVE 07/17/2024 1500   Sepsis Labs: @LABRCNTIP (procalcitonin:4,lacticidven:4)  ) Recent Results (from the past 240 hours)  Respiratory (~20 pathogens) panel by PCR     Status: None   Collection Time: 12/26/24 10:29 PM   Specimen: Nasopharyngeal Swab; Respiratory  Result Value Ref Range Status   Adenovirus NOT DETECTED NOT DETECTED Final   Coronavirus 229E NOT DETECTED NOT DETECTED Final    Comment: (NOTE) The Coronavirus on the Respiratory Panel, DOES NOT test for the novel  Coronavirus (2019 nCoV)    Coronavirus HKU1 NOT DETECTED NOT DETECTED Final   Coronavirus NL63 NOT DETECTED NOT DETECTED Final   Coronavirus OC43 NOT DETECTED NOT DETECTED Final  Metapneumovirus NOT DETECTED NOT DETECTED Final   Rhinovirus / Enterovirus NOT DETECTED NOT DETECTED Final   Influenza A NOT DETECTED NOT DETECTED Final   Influenza B NOT DETECTED NOT DETECTED Final   Parainfluenza Virus 1 NOT DETECTED NOT DETECTED Final   Parainfluenza Virus 2 NOT DETECTED NOT DETECTED Final   Parainfluenza Virus 3 NOT DETECTED NOT DETECTED Final   Parainfluenza Virus 4 NOT DETECTED NOT DETECTED Final   Respiratory Syncytial Virus NOT DETECTED NOT DETECTED Final   Bordetella pertussis NOT DETECTED NOT DETECTED Final   Bordetella Parapertussis NOT DETECTED NOT DETECTED Final   Chlamydophila pneumoniae NOT DETECTED NOT DETECTED Final   Mycoplasma pneumoniae NOT DETECTED NOT DETECTED Final    Comment: Performed at [facility], [address] [place], [place] [address]     Radiology Studies: ECHOCARDIOGRAM COMPLETE Result Date: 12/28/2024    ECHOCARDIOGRAM REPORT   Patient Name:   Chrissi MILLER-WASHINGTON  Date of Exam: 12/28/2024 Medical Rec #:  [id number]               Height:       59.0 in  Accession #:    [id number]              Weight:       95.7 lb Date of Birth:  1961-10-09               BSA:          1.348 m Patient Age:    63 years                BP:           103/57 mmHg Patient Gender: F                       HR:           106 bpm. Exam Location:  Inpatient Procedure: 2D Echo, Cardiac Doppler, Color Doppler and Intracardiac            Opacification Agent (Both Spectral and Color Flow Doppler were            utilized during procedure). Indications:    CHF - acute systolic  History:        Patient has prior history of Echocardiogram examinations, most                 recent 11/26/2023. CHF and Cardiomyopathy, Previous Myocardial                 Infarction and CAD, COPD; Risk Factors:Hypertension, Diabetes                 and Dyslipidemia.  Sonographer:    Odella Brewster Referring Phys: [id number] SUBRINA SUNDIL IMPRESSIONS  1. Left ventricular ejection fraction, by estimation, is <20%. The left ventricle has severely decreased function. The left ventricle has no regional wall motion abnormalities. The left ventricular internal cavity size was severely dilated. Indeterminate diastolic filling due to E-A fusion.  2. Right ventricular systolic function is mildly reduced. The right ventricular size is mildly enlarged. There is moderately elevated pulmonary artery systolic pressure. The estimated right ventricular systolic pressure is 52.1 mmHg.  3. Left atrial size was severely dilated.  4. Right atrial size was mildly dilated.  5. The mitral valve is grossly normal. Moderate to severe mitral valve regurgitation. No evidence of mitral stenosis.  6. Tricuspid valve regurgitation is mild to moderate.  7. The aortic valve is  tricuspid. There is mild calcification of the aortic valve. Aortic valve regurgitation is severe. Aortic valve sclerosis is present, with no evidence of aortic valve stenosis.  8. The inferior vena cava is normal in size with <50% respiratory variability, suggesting right atrial pressure  of 8 mmHg. Comparison(s): No significant change from prior study. Changes from prior study are noted. Moderate to severe mitral regurgitation is now present. Severe LV dysfunction and severe AI remain present. Conclusion(s)/Recommendation(s): No left ventricular mural or apical thrombus/thrombi. FINDINGS  Left Ventricle: Left ventricular ejection fraction, by estimation, is <20%. The left ventricle has severely decreased function. The left ventricle has no regional wall motion abnormalities. Definity  contrast agent was given IV to delineate the left ventricular endocardial borders. The left ventricular internal cavity size was severely dilated. There is no left ventricular hypertrophy. Indeterminate diastolic filling due to E-A fusion.  LV Wall Scoring: The entire septum and apex are akinetic. Right Ventricle: The right ventricular size is mildly enlarged. No increase in right ventricular wall thickness. Right ventricular systolic function is mildly reduced. There is moderately elevated pulmonary artery systolic pressure. The tricuspid regurgitant velocity is 3.32 m/s, and with an assumed right atrial pressure of 8 mmHg, the estimated right ventricular systolic pressure is 52.1 mmHg. Left Atrium: Left atrial size was severely dilated. Right Atrium: Right atrial size was mildly dilated. Pericardium: There is no evidence of pericardial effusion. Mitral Valve: The mitral valve is grossly normal. Moderate to severe mitral valve regurgitation. No evidence of mitral valve stenosis. Tricuspid Valve: The tricuspid valve is grossly normal. Tricuspid valve regurgitation is mild to moderate. No evidence of tricuspid stenosis. Aortic Valve: The aortic valve is tricuspid. There is mild calcification of the aortic valve. Aortic valve regurgitation is severe. Aortic regurgitation PHT measures 54 msec. Aortic valve sclerosis is present, with no evidence of aortic valve stenosis. Aortic valve mean gradient measures 7.0 mmHg. Aortic  valve peak gradient measures 12.5 mmHg. Aortic valve area, by VTI measures 1.88 cm. Pulmonic Valve: The pulmonic valve was grossly normal. Pulmonic valve regurgitation is not visualized. No evidence of pulmonic stenosis. Aorta: The aortic root and ascending aorta are structurally normal, with no evidence of dilitation. Venous: The inferior vena cava is normal in size with less than 50% respiratory variability, suggesting right atrial pressure of 8 mmHg. IAS/Shunts: There is right bowing of the interatrial septum, suggestive of elevated left atrial pressure. The atrial septum is grossly normal. Additional Comments: A device lead is visualized in the right atrium and right ventricle.  LEFT VENTRICLE PLAX 2D LVIDd:         6.40 cm      Diastology LVIDs:         6.00 cm      LV e' medial:    5.44 cm/s LV PW:         1.00 cm      LV E/e' medial:  13.3 LV IVS:        0.50 cm      LV e' lateral:   7.83 cm/s LVOT diam:     1.90 cm      LV E/e' lateral: 9.2 LV SV:         45 LV SV Index:   34 LVOT Area:     2.84 cm LV IVRT:       70 msec  LV Volumes (MOD) LV vol d, MOD A2C: 189.0 ml LV vol d, MOD A4C: 163.5 ml LV vol s, MOD A2C: 170.0 ml  LV vol s, MOD A4C: 148.0 ml LV SV MOD A2C:     19.0 ml LV SV MOD A4C:     163.5 ml LV SV MOD BP:      15.1 ml RIGHT VENTRICLE            IVC RV S prime:     7.62 cm/s  IVC diam: 1.60 cm TAPSE (M-mode): 1.2 cm                            PULMONARY VEINS                            Diastolic Velocity: 82.30 cm/s                            S/D Velocity:       0.30                            Systolic Velocity:  24.10 cm/s LEFT ATRIUM             Index        RIGHT ATRIUM          Index LA diam:        4.40 cm 3.26 cm/m   RA Area:     9.50 cm LA Vol (A2C):   63.9 ml 47.40 ml/m  RA Volume:   20.90 ml 15.50 ml/m LA Vol (A4C):   50.1 ml 37.16 ml/m LA Biplane Vol: 59.5 ml 44.13 ml/m  AORTIC VALVE                     PULMONIC VALVE AV Area (Vmax):    2.03 cm      PV Vmax:       0.88 m/s AV Area  (Vmean):   1.68 cm      PV Peak grad:  3.1 mmHg AV Area (VTI):     1.88 cm AV Vmax:           177.00 cm/s AV Vmean:          121.000 cm/s AV VTI:            0.240 m AV Peak Grad:      12.5 mmHg AV Mean Grad:      7.0 mmHg LVOT Vmax:         126.50 cm/s LVOT Vmean:        71.850 cm/s LVOT VTI:          0.160 m LVOT/AV VTI ratio: 0.66 AI PHT:            54 msec  AORTA Ao Root diam: 2.80 cm Ao Asc diam:  2.80 cm MITRAL VALVE               TRICUSPID VALVE MV Area (PHT): 6.60 cm    TR Peak grad:   44.1 mmHg MV Decel Time: 115 msec    TR Vmax:        332.00 cm/s MV E velocity: 72.30 cm/s MV A velocity: 47.20 cm/s  SHUNTS MV E/A ratio:  1.53        Systemic VTI:  0.16 m  Systemic Diam: 1.90 cm Darryle Decent MD Electronically signed by Darryle Decent MD Signature Date/Time: 12/28/2024/4:23:51 PM    Final      Scheduled Meds:  acetaminophen   650 mg Oral Once   apixaban   2.5 mg Oral BID   atorvastatin   80 mg Oral Daily   benzonatate   100 mg Oral TID   Chlorhexidine  Gluconate Cloth  6 each Topical Daily   dapagliflozin  propanediol  10 mg Oral Daily   digoxin   0.0625 mg Oral Daily   docusate sodium   100 mg Oral BID   insulin  aspart  0-5 Units Subcutaneous QHS   insulin  aspart  0-6 Units Subcutaneous TID WC   ivabradine   7.5 mg Oral BID WC   mirtazapine   15 mg Oral QHS   ondansetron  (ZOFRAN ) IV  4 mg Intravenous Once   pantoprazole   40 mg Oral Q0600   potassium chloride   40 mEq Oral Q4H   sodium chloride  flush  10-40 mL Intracatheter Q12H   sodium chloride  flush  3 mL Intravenous Q12H   sodium chloride  flush  3 mL Intravenous Q12H   spironolactone   25 mg Oral Daily   torsemide   100 mg Oral BID   Continuous Infusions:     LOS: 4 days    Time spent:    Sigurd Pac, MD [facility]   12/30/2024, 9:48 AM    "

## 2024-12-30 NOTE — Progress Notes (Signed)
 PT Cancellation Note  Patient Details Name: Brittany [name]  MRN: [id number] DOB: [birth year]   Cancelled Treatment:    Reason Eval/Treat Not Completed: Patient declined, no reason specified. Pt eating a hamburger and fries that family brought, refused PT.   Richerd Lipoma, PT  Acute Rehab Services Secure chat preferred Office [phone]    Richerd LITTIE Lipoma 12/30/2024, 5:06 PM

## 2024-12-30 NOTE — Progress Notes (Signed)
 Initial Nutrition Assessment  DOCUMENTATION CODES:   Severe malnutrition in context of chronic illness  INTERVENTION:   Ensure Plus High Protein po BID, each supplement provides 350 kcal and 20 grams of protein.   Encouraged small and frequent meals.   Liberalized diet from Heart Healthy to Regular. SABRA   NUTRITION DIAGNOSIS:   Severe Malnutrition related to chronic illness (COPD and End Stage HF) as evidenced by severe muscle depletion, severe fat depletion.   GOAL:   Patient will meet greater than or equal to 90% of their needs   MONITOR:   PO intake, Supplement acceptance  REASON FOR ASSESSMENT:   Malnutrition Screening Tool, Other (Comment) (Low BMI)   ASSESSMENT:   PMH: COPD, CHF, lung cancer, T2DM, A-fib, CKD. Presented with: severe respiratory distress. Admitted for: Class 4 CHF  Per HF team, pt remains a poor candidate for advanced therapies recommended the consideration of palliative care. DBA discontinued today. Of note planned to discharge within 48 hrs if stable.   Pt reported hx. Pt began feeling nauseous on Friday and has had a poor appetite and intake. Since admitted she has not has much intake. She reports that she is getting 3 Glucerna shakes because her glucose is high due to being on steroids; sipping on them throughout the day. She has had bites of graham crackers and saltines.  Pt lives alone at home, has no problems getting around. PTA pt reports her appetite was normal. She would wake up late and eat her first meal around 1pm consisting of eggs, bacon, or sausage on wheat bread and air fried chicken or ribs for dinner. She would have some fruit for snacks. Pt reports at home she drinks 3 chocolate Ensures a day. Despite report of stable intake pt continues to endorse wt loss.   Average meal completions 17% x 3 recorded meals (1/19).   Of note pt currently weighs 36.6 kg. She was unable to recall her usually body weight but claims she's just been  dropping weight. In November she weighed 40.4 kg. This reflects a wt loss of 9.4% in 2 months which is clinically significant for time frame. This wt loss is consistent with her physical exam and her low nutritional intake.   Labs reviewed:  CBG: 116-240 A1C: 6.5 (08/2024)  Na: 134 K: 3.2 Cl: 88 BUN: 64 Creatinine: 1.53 Mg: 2.5    Scheduled Meds:  atorvastatin   80 mg Oral Daily   dapagliflozin  propanediol  10 mg Oral Daily   docusate sodium   100 mg Oral BID   insulin  aspart  0-5 Units Subcutaneous QHS   insulin  aspart  0-6 Units Subcutaneous TID WC   pantoprazole   40 mg Oral Q0600   potassium chloride   40 mEq Oral Q4H   spironolactone   25 mg Oral Daily    NUTRITION - FOCUSED PHYSICAL EXAM:  Flowsheet Row Most Recent Value  Orbital Region Severe depletion  Upper Arm Region Severe depletion  Thoracic and Lumbar Region Severe depletion  Buccal Region Severe depletion  Temple Region Severe depletion  Clavicle Bone Region Severe depletion  Clavicle and Acromion Bone Region Severe depletion  Scapular Bone Region Severe depletion  Dorsal Hand Severe depletion  Patellar Region Severe depletion  Anterior Thigh Region Severe depletion  Posterior Calf Region Severe depletion  Edema (RD Assessment) None  Hair Reviewed  Eyes Reviewed  Mouth Reviewed  Skin Reviewed  Nails Reviewed  [painted]    Diet Order:   Diet Order  Diet regular Room service appropriate? Yes with Assist; Fluid consistency: Thin; Fluid restriction: 1800 mL Fluid  Diet effective now                   EDUCATION NEEDS:   Education needs have been addressed  Skin:  Skin Assessment: Reviewed RN Assessment  Last BM:  1/17  Height:   Ht Readings from Last 1 Encounters:  12/30/24 4' 11 (1.499 m)    Weight:   Wt Readings from Last 1 Encounters:  12/30/24 36.6 kg    Ideal Body Weight:     BMI:  Body mass index is 16.3 kg/m.  Estimated Nutritional Needs:   Kcal:   1,200-1,400  Protein:  50-65 g  Fluid:  1.5 L/day    Con Friends, Dietetic Intern

## 2024-12-30 NOTE — Plan of Care (Signed)
" °  Problem: Metabolic: Goal: Ability to maintain appropriate glucose levels will improve Outcome: Progressing   Problem: Nutritional: Goal: Maintenance of adequate nutrition will improve Outcome: Progressing   Problem: Clinical Measurements: Goal: Ability to maintain clinical measurements within normal limits will improve Outcome: Progressing   Problem: Pain Managment: Goal: General experience of comfort will improve and/or be controlled Outcome: Progressing   "

## 2024-12-30 NOTE — Progress Notes (Signed)
 PT Cancellation Note  Patient Details Name: Brittany [name]  MRN: [id number] DOB: October 30, 1961   Cancelled Treatment:    Reason Eval/Treat Not Completed: Medical issues which prohibited therapy (nausea). Pt defers therapy at this time due to feeling sick to her stomach. Will check back later in day as schedule allows.   Richerd Lipoma, PT  Acute Rehab Services Secure chat preferred Office [phone]    Richerd LITTIE Lipoma 12/30/2024, 9:01 AM

## 2024-12-30 NOTE — Progress Notes (Signed)
 Patient states she wears her by Bipap as needed and does not want to wear it tonight. Will keep it on standby.

## 2024-12-30 NOTE — Progress Notes (Addendum)
 "    Advanced Heart Failure Rounding Note  [facility] Cardiologist: Dr. Cherrie  Patient Profile   Brittany [name]  is a 64 y.o. female with a history of PAF, CAD s/p emergent CABG in 2019 and DES in 2020, R renal infarct in 2022,  previous smoker quit in 2015, hypertension, previous small cell lung cancer treated with chemo, chest XRT and prophylactic brain radiation in 2015, chronic systolic HF EF ~25% and COPD.   Admitted with acute on chronic respiratory failure 2/2 a/c CHF w/ low output and ACOPDE.   Significant events:   1/18: Lactic acid up to 3.2. Started on inotropic support with DBA  Subjective:    Co-ox 75% on DBA 1.  On 4L [place] overnight, refused BiPAP. Weight stable. BMP pending.   Still feeling poor with some shortness of breath. Biggest complaint is nausea and lack of appetite.   Objective:    Weight Range: 36.6 kg Body mass index is 16.3 kg/m.   Vital Signs:   Temp:  [97.4 F (36.3 C)-98.5 F (36.9 C)] 98 F (36.7 C) (01/20 0300) Pulse Rate:  [85-102] 89 (01/20 0616) Resp:  [9-18] 12 (01/20 0616) BP: (85-119)/(52-71) 93/56 (01/20 0616) SpO2:  [93 %-100 %] 99 % (01/20 0616) FiO2 (%):  [28 %] 28 % (01/19 0757) Weight:  [36.6 kg] 36.6 kg (01/20 0300) Last BM Date : 12/27/24  Weight change: Filed Weights   12/28/24 0332 12/29/24 0300 12/30/24 0300  Weight: 43.4 kg 36.7 kg 36.6 kg   Intake/Output:  Intake/Output Summary (Last 24 hours) at 12/30/2024 0710 Last data filed at 12/30/2024 0600 Gross per 24 hour  Intake 992.7 ml  Output 350 ml  Net 642.7 ml    Physical Exam   General: Chronically-ill appearing. No distress  Cardiac: JVP ~8 cm. No murmurs  Lungs: Coarse, rhonchus with expiratory wheezing Extremities: Warm and dry.  No edema.   Telemetry   SR 80-90s (personally reviewed)  Labs   CBC Recent Labs    12/28/24 0239  WBC 9.3  HGB 14.7  HCT 44.8  MCV 88.5  PLT 228   Basic Metabolic Panel Recent Labs    98/81/73 0239  12/29/24 0510  NA 139 135  K 3.3* 3.0*  CL 95* 85*  CO2 27 36*  GLUCOSE 87 156*  BUN 52* 63*  CREATININE 1.49* 1.73*  CALCIUM  10.3 10.9*  MG  --  2.5*   BNP (last 3 results) Recent Labs    09/16/24 1115 10/22/24 1526 11/17/24 1618  BNP 1,596.2* 1,589.7* 1,898.1*    ProBNP (last 3 results) Recent Labs    12/26/24 2003  PROBNP 12,902.0*   Medications:    Scheduled Medications:  acetaminophen   650 mg Oral Once   apixaban   2.5 mg Oral BID   atorvastatin   80 mg Oral Daily   benzonatate   100 mg Oral TID   Chlorhexidine  Gluconate Cloth  6 each Topical Daily   dapagliflozin  propanediol  10 mg Oral Daily   digoxin   0.0625 mg Oral Daily   docusate sodium   100 mg Oral BID   insulin  aspart  0-5 Units Subcutaneous QHS   insulin  aspart  0-6 Units Subcutaneous TID WC   ipratropium-albuterol   3 mL Nebulization Q6H   ivabradine   7.5 mg Oral BID WC   pantoprazole   40 mg Oral Q0600   sodium chloride  flush  10-40 mL Intracatheter Q12H   sodium chloride  flush  3 mL Intravenous Q12H   sodium chloride  flush  3 mL Intravenous  Q12H   spironolactone   25 mg Oral Daily    Infusions:  DOBUTamine  1 mcg/kg/min (12/30/24 0600)    PRN Medications: acetaminophen  **OR** acetaminophen , guaiFENesin -dextromethorphan , ipratropium-albuterol , sodium chloride  flush, sodium chloride  flush, traMADol   Assessment/Plan   1. Acute on Chronic Systolic Heart Failure, end-stage - HF felt to be end-stage. EF has been 20-25% for the last 5 years.  - S/p Medtronic ICD and Barostim - Last Echo 4/25: EF < 20%, RV normal  - Numerous hospitalizations over last 2 years  - Now readmitted w/ NYHA IV symptoms and volume overload c/b low output with lactic acidosis and cardio renal syndrome - Co-ox 75% on 1 DBA. Stop today.  - GDMT limited by soft BPs - restart 100 mg Torsemide  BID  - continue Farxiga  10 mg daily  - continue ivabradine  7.5 mg bid - continue digoxin  0.0625 mg daily - continue spiro 25 mg  daily - she is not a candidate for advanced therapies. We discussed consideration of palliative support previous hospitalization but pt not yet ready for this.   2. Hypokalemia - a chronic problem for her - BMP[ pending. Will supp if needed - continue spiro 25 mg daily    3. A/c Hypoxic Respiratory Failure - multifactorial 2/2 a/c CHF and ACOPDE - diuresis per above - continue O2 - on COPD treatment per IM   3. CKD Stage IIIb  - Creatinine baseline ~ 1.4-1.7 - SCr higher, BMP pending. - Continue SGLT2i   4. PAF  - In NSR - Continue Eliquis  2.5 mg bid. Reduced dose (weight/creatinine).   5. CAD - History of CABG x 2 2019 - s/p DES ostial ramus extending to left main (2020) - no s/s angina. Hstrop ok - Continue atorvastatin  80 mg daily - No ASA with need for AC   6. H/o SCLC - Completed treatment 2015.  - CT chest (5/24) soft tissue thickening along mediastinum but no discrete mass    7. H/o Renal Infarct 2022 - Continue Eliquis . No bleeding   8. DNR/DNI  Length of Stay: 4  Jordan Lee, NP  12/30/2024, 7:10 AM  Advanced Heart Failure Team Pager [phone] (M-F; 7a - 5p)   Please visit [email or web address]: For overnight coverage please call cardiology fellow first. If fellow not available call Shock/ECMO MD on call.  For ECMO / Mechanical Support (Impella, IABP, LVAD) issues call Shock / ECMO MD on call.    Patient seen and examined with the above-signed Advanced Practice Provider and/or Housestaff. I personally reviewed laboratory data, imaging studies and relevant notes. I independently examined the patient and formulated the important aspects of the plan. I have edited the note to reflect any of my changes or salient points. I have personally discussed the plan with the patient and/or family.  Remains on DBA 1. Co-ox 75% CVP low  SCr 1.7 -> 1.5 K 3.2  Feels weak. Poor appetite  General:  Frail No resp difficulty HEENT: normal Neck: supple. no JVD.  Cor: Regular rate &  rhythm. No rubs, gallops or murmurs. Lungs: clear Abdomen: soft, nontender, nondistended.Good bowel sounds. Extremities: no cyanosis, clubbing, rash, edema Neuro: alert & orientedx3, cranial nerves grossly intact. moves all 4 extremities w/o difficulty. Affect pleasant  She has end-stage HF. Co-ox stable on low-dose DBA. Will stop DBA and follow.   Supp K. Continues to refuse Hospice referral.   Toribio Fuel, MD  10:06 AM  "

## 2024-12-31 ENCOUNTER — Telehealth (HOSPITAL_COMMUNITY): Payer: Self-pay

## 2024-12-31 ENCOUNTER — Other Ambulatory Visit (HOSPITAL_COMMUNITY): Payer: Self-pay

## 2024-12-31 DIAGNOSIS — I5041 Acute combined systolic (congestive) and diastolic (congestive) heart failure: Secondary | ICD-10-CM | POA: Diagnosis not present

## 2024-12-31 LAB — BASIC METABOLIC PANEL WITH GFR
Anion gap: 15 (ref 5–15)
BUN: 71 mg/dL — ABNORMAL HIGH (ref 8–23)
CO2: 35 mmol/L — ABNORMAL HIGH (ref 22–32)
Calcium: 10 mg/dL (ref 8.9–10.3)
Chloride: 84 mmol/L — ABNORMAL LOW (ref 98–111)
Creatinine, Ser: 1.74 mg/dL — ABNORMAL HIGH (ref 0.44–1.00)
GFR, Estimated: 32 mL/min — ABNORMAL LOW
Glucose, Bld: 118 mg/dL — ABNORMAL HIGH (ref 70–99)
Potassium: 3.1 mmol/L — ABNORMAL LOW (ref 3.5–5.1)
Sodium: 134 mmol/L — ABNORMAL LOW (ref 135–145)

## 2024-12-31 LAB — COOXEMETRY PANEL
Carboxyhemoglobin: 1.9 % — ABNORMAL HIGH (ref 0.5–1.5)
Methemoglobin: <0.7 % (ref 0.0–1.5)
O2 Saturation: 71.7 %
Total hemoglobin: 16.7 g/dL — ABNORMAL HIGH (ref 12.0–16.0)

## 2024-12-31 LAB — GLUCOSE, CAPILLARY
Glucose-Capillary: 177 mg/dL — ABNORMAL HIGH (ref 70–99)
Glucose-Capillary: 192 mg/dL — ABNORMAL HIGH (ref 70–99)

## 2024-12-31 LAB — MAGNESIUM: Magnesium: 2.7 mg/dL — ABNORMAL HIGH (ref 1.7–2.4)

## 2024-12-31 MED ORDER — POTASSIUM CHLORIDE 20 MEQ PO PACK
40.0000 meq | PACK | Freq: Two times a day (BID) | ORAL | Status: DC
  Administered 2024-12-31: 40 meq via ORAL
  Filled 2024-12-31: qty 2

## 2024-12-31 MED ORDER — ACCU-CHEK GUIDE TEST VI STRP
ORAL_STRIP | 0 refills | Status: DC
  Filled 2024-12-31: qty 100, 25d supply, fill #0
  Filled 2024-12-31: qty 100, 30d supply, fill #0

## 2024-12-31 MED ORDER — OMRON 3 SERIES BP MONITOR DEVI
0 refills | Status: DC
  Filled 2024-12-31: qty 1, 30d supply, fill #0

## 2024-12-31 MED ORDER — POTASSIUM CHLORIDE 20 MEQ PO PACK: 40.0000 meq | PACK | Freq: Two times a day (BID) | ORAL | Status: AC

## 2024-12-31 MED ORDER — DAPAGLIFLOZIN PROPANEDIOL 10 MG PO TABS
10.0000 mg | ORAL_TABLET | Freq: Every day | ORAL | 0 refills | Status: AC
  Filled 2024-12-31: qty 30, 30d supply, fill #0

## 2024-12-31 MED ORDER — MIRTAZAPINE 15 MG PO TABS
15.0000 mg | ORAL_TABLET | Freq: Every day | ORAL | 0 refills | Status: AC
  Filled 2024-12-31: qty 30, 30d supply, fill #0

## 2024-12-31 MED ORDER — COMFORT TOUCH BP CUFF/MEDIUM MISC: 1.0000 | Freq: Every day | 0 refills | Status: DC

## 2024-12-31 MED ORDER — ONETOUCH DELICA PLUS LANCET33G MISC
0 refills | Status: DC
  Filled 2024-12-31 (×2): qty 100, 25d supply, fill #0

## 2024-12-31 MED ORDER — COMFORT TOUCH BP CUFF/MEDIUM MISC
1.0000 | Freq: Every day | 0 refills | Status: DC
  Filled 2024-12-31: qty 1, fill #0

## 2024-12-31 MED ORDER — BLOOD GLUCOSE MONITOR SYSTEM W/DEVICE KIT
PACK | 0 refills | Status: DC
  Filled 2024-12-31 (×2): qty 1, 30d supply, fill #0

## 2024-12-31 NOTE — TOC Transition Note (Signed)
 Transition of Care [facility]) - Discharge Note   Patient Details  Name: Brittany [name]  MRN: [id number] Date of Birth: 1961-07-08  [facility]) CM/SW Contact:  Waddell Barnie Rama, RN Phone Number: 12/31/2024, 10:59 AM   Clinical Narrative:    For dc today, patient does not want any HH services, she states she just need a glucometer with strips and a bp cuff.  NCM asked MD to order these for her. Patient has transport at dc.    Final next level of care: Other (comment) (To be determined) Barriers to Discharge: Continued Medical Work up   Patient Goals and CMS Choice Patient states their goals for this hospitalization and ongoing recovery are:: return home CMS Medicare.gov Compare Post Acute Care list provided to:: Patient Choice offered to / list presented to : NA [place] ownership interest in [facility].provided to:: Patient    Discharge Placement                       Discharge Plan and Services Additional resources added to the After Visit Summary for   In-house Referral: NA Discharge Planning Services: CM Consult Post Acute Care Choice: NA          DME Arranged: N/A DME Agency: NA       HH Arranged: NA          Social Drivers of Health (SDOH) Interventions SDOH Screenings   Food Insecurity: No Food Insecurity (12/29/2024)  Housing: Low Risk (12/29/2024)  Transportation Needs: No Transportation Needs (12/29/2024)  Recent Concern: Transportation Needs - Unmet Transportation Needs (11/17/2024)  Utilities: Not At Risk (12/29/2024)  Alcohol Screen: Low Risk (08/14/2024)  Depression (PHQ2-9): Low Risk (07/22/2024)  Physical Activity: Insufficiently Active (08/14/2024)  Social Connections: Unknown (12/29/2024)  Tobacco Use: Medium Risk (12/26/2024)     Readmission Risk Interventions    12/29/2024    1:44 PM 08/21/2024   12:01 PM 02/18/2024    3:51 PM  Readmission Risk Prevention Plan  Transportation Screening Complete Complete  Complete  Medication Review Oceanographer) Complete Complete Complete  PCP or Specialist appointment within 3-5 days of discharge Complete Complete Complete  HRI or Home Care Consult Complete Patient refused Complete  Palliative Care Screening Not Applicable Patient Refused Not Applicable  Skilled Nursing Facility Not Applicable Not Applicable Not Applicable

## 2024-12-31 NOTE — Progress Notes (Addendum)
 "    Advanced Heart Failure Rounding Note  [facility] Cardiologist: Dr. Cherrie Chief Complaint: End-Stage HF Patient Profile   Brittany [name]  is a 64 y.o. female with a history of PAF, CAD s/p emergent CABG in 2019 and DES in 2020, R renal infarct in 2022,  previous smoker quit in 2015, hypertension, previous small cell lung cancer treated with chemo, chest XRT and prophylactic brain radiation in 2015, chronic systolic HF EF ~25% and COPD.   Admitted with acute on chronic respiratory failure 2/2 a/c CHF w/ low output and ACOPDE.   Significant events:   1/18: Lactic acid up to 3.2. Started on inotropic support with DBA  Subjective:    Co-ox is stable at 72 off DBA. Making 1L [place] on torsemide  100 mg bid. Cr 1.7>1.5>1.7. K remains low, she is refusing K supp for the most part. Complains of nausea and lack of appetite, which are likely due to her end-stage heart failure, which was discussed. Has refused hospice referral. She is feeling much better today and would like to go home.   Objective:    Weight Range: 36.3 kg Body mass index is 16.16 kg/m.   Vital Signs:   Temp:  [97.7 F (36.5 C)-98.7 F (37.1 C)] 98.7 F (37.1 C) (01/21 0344) Pulse Rate:  [79-92] 87 (01/21 0344) Resp:  [11-19] 18 (01/21 0344) BP: (93-107)/(54-62) 93/55 (01/21 0344) SpO2:  [98 %-100 %] 100 % (01/21 0344) Weight:  [36.3 kg] 36.3 kg (01/21 0344) Last BM Date : 12/27/24  Weight change: Filed Weights   12/29/24 0300 12/30/24 0300 12/31/24 0344  Weight: 36.7 kg 36.6 kg 36.3 kg   Intake/Output:  Intake/Output Summary (Last 24 hours) at 12/31/2024 0658 Last data filed at 12/31/2024 0401 Gross per 24 hour  Intake 490 ml  Output 1050 ml  Net -560 ml    Physical Exam   General: Chronically-ill appearing. No distress  Cardiac: JVP flat. No murmurs  Abdomen: Soft, flat Extremities: Warm and dry.  No peripheral edema.  Neuro: A&O x3. Affect pleasant.   Telemetry   SR 80s (personally  reviewed)  Labs   CBC No results for input(s): WBC, NEUTROABS, HGB, HCT, MCV, PLT in the last 72 hours.  Basic Metabolic Panel Recent Labs    98/79/73 0652 12/31/24 0500  NA 134* 134*  K 3.2* 3.1*  CL 88* 84*  CO2 31 35*  GLUCOSE 150* 118*  BUN 64* 71*  CREATININE 1.53* 1.74*  CALCIUM  9.5 10.0  MG 2.5* 2.7*   BNP (last 3 results) Recent Labs    09/16/24 1115 10/22/24 1526 11/17/24 1618  BNP 1,596.2* 1,589.7* 1,898.1*    ProBNP (last 3 results) Recent Labs    12/26/24 2003  PROBNP 12,902.0*   Medications:    Scheduled Medications:  acetaminophen   650 mg Oral Once   apixaban   2.5 mg Oral BID   atorvastatin   80 mg Oral Daily   benzonatate   100 mg Oral TID   Chlorhexidine  Gluconate Cloth  6 each Topical Daily   dapagliflozin  propanediol  10 mg Oral Daily   digoxin   0.0625 mg Oral Daily   docusate sodium   100 mg Oral BID   feeding supplement  237 mL Oral TID BM   insulin  aspart  0-5 Units Subcutaneous QHS   insulin  aspart  0-6 Units Subcutaneous TID WC   ivabradine   7.5 mg Oral BID WC   mirtazapine   15 mg Oral QHS   ondansetron  (ZOFRAN ) IV  4 mg Intravenous Once  pantoprazole   40 mg Oral Q0600   potassium chloride   40 mEq Oral BID   potassium chloride   40 mEq Oral BID   sodium chloride  flush  10-40 mL Intracatheter Q12H   sodium chloride  flush  3 mL Intravenous Q12H   sodium chloride  flush  3 mL Intravenous Q12H   spironolactone   25 mg Oral Daily   torsemide   100 mg Oral BID    Infusions:    PRN Medications: acetaminophen  **OR** acetaminophen , fentaNYL  (SUBLIMAZE ) injection, guaiFENesin -dextromethorphan , ipratropium-albuterol , sodium chloride  flush, sodium chloride  flush, traMADol   Assessment/Plan   1. Acute on Chronic Systolic Heart Failure, end-stage - HF felt to be end-stage. EF has been 20-25% for the last 5 years.  - S/p Medtronic ICD and Barostim - Last Echo 4/25: EF < 20%, RV normal  - Numerous hospitalizations over last 2  years  - Now readmitted w/ NYHA IV symptoms and volume overload c/b low output with lactic acidosis and cardio renal syndrome - Co-ox stable 72% off DBA.  - GDMT limited by soft BPs - continue 100 mg Torsemide  BID  - continue Farxiga  10 mg daily  - continue ivabradine  7.5 mg bid - continue digoxin  0.0625 mg daily - continue spiro 25 mg daily - she is not a candidate for advanced therapies. We discussed consideration of palliative/hospice support, however she has refused hospice referral.    2. Hypokalemia - a chronic problem for her - K remains low. Refusing K supp - continue spiro 25 mg daily    3. A/c Hypoxic Respiratory Failure - multifactorial 2/2 a/c CHF and ACOPDE - diuresis per above - continue O2 - on COPD treatment per IM   3. CKD Stage IIIb  - Creatinine baseline ~ 1.4-1.7 - SCr higher, BMP pending. - Continue SGLT2i   4. PAF  - In NSR - Continue Eliquis  2.5 mg bid. Reduced dose (weight/creatinine).   5. CAD - History of CABG x 2 2019 - s/p DES ostial ramus extending to left main (2020) - no s/s angina. Hstrop ok - Continue atorvastatin  80 mg daily - No ASA with need for AC   6. H/o SCLC - Completed treatment 2015.  - CT chest (5/24) soft tissue thickening along mediastinum but no discrete mass    7. H/o Renal Infarct 2022 - Continue Eliquis . No bleeding   8. DNR/DNI  Heart failure team will sign off as of 12/31/24 after seen by Dr. Cherrie  HF Team Medication Recommendations for Home: - eliquis  2.5 gm bid - atorvastatin  80 mg daily - farxiga  10 mg daily - ivabridine 7.5 mg daily - mirtazapine  15 mg daily at bedtime - potassium 40 mEq bid - torsemide  100 mg bid - spirolactone 25 mg daily  Has follow up with HF Clinic on 01/07/25.  Length of Stay: 5  Jordan Lee, NP  12/31/2024, 6:58 AM  Advanced Heart Failure Team Pager [phone] (M-F; 7a - 5p)   Please visit [email or web address]: For overnight coverage please call cardiology fellow first. If fellow  not available call Shock/ECMO MD on call.  For ECMO / Mechanical Support (Impella, IABP, LVAD) issues call Shock / ECMO MD on call.    Patient seen and examined with the above-signed Advanced Practice Provider and/or Housestaff. I personally reviewed laboratory data, imaging studies and relevant notes. I independently examined the patient and formulated the important aspects of the plan. I have edited the note to reflect any of my changes or salient points. I have personally discussed the plan with the patient  and/or family.  Feeling better today. Back pain improved. Co-ox and CVP stable off DBA   General:  Sitting up in bed. Frail No resp difficulty HEENT: normal Neck: supple. no JVD.  Cor: Regular rate & rhythm. No rubs, gallops or murmurs. Lungs: clear Abdomen: soft, nontender, nondistended.Good bowel sounds. Extremities: no cyanosis, clubbing, rash, edema Neuro: alert & orientedx3, cranial nerves grossly intact. moves all 4 extremities w/o difficulty. Affect pleasant  She has end-stage HF and has refused Hospice. Now back to baseline. Ok for d/c today from HF perspective. Will arrange outpatient f/u.   Toribio Fuel, MD  9:05 AM  "

## 2024-12-31 NOTE — Discharge Summary (Signed)
 Physician Discharge Summary  Alekhya Gravlin  FMW:[id number] DOB: [birth year] DOA: 12/26/2024  PCP: Tanda Bleacher, MD  Admit date: 12/26/2024 Discharge date: 12/31/2024  Time spent: 45 minutes  Recommendations for Outpatient Follow-up:  Advanced heart failure clinic on 1/28, continue palliative care discussions, prognosis is very poor  Discharge Diagnoses:  Principal Problem:   Acute combined systolic and diastolic CHF, NYHA class 4 (HCC) Active Problems:   Acute hypoxic respiratory failure (HCC)   History of implantable cardioverter-defibrillator (ICD) placement   Elevated troponin   COPD with acute exacerbation (HCC)   PAF (paroxysmal atrial fibrillation) (HCC)   Non-insulin  dependent type 2 diabetes mellitus (HCC)   CKD stage 3b, GFR 30-44 ml/min (HCC)   Chronic hypoxic respiratory failure (HCC)   Ischemic cardiomyopathy   Acute on chronic HFrEF (heart failure with reduced ejection fraction) (HCC)   Heart failure, diastolic, due to CAD (HCC)   Acute exacerbation of CHF (congestive heart failure) (HCC)   Long term (current) use of anticoagulants DO NOT RESUSCITATE  Discharge Condition: Improved  Diet recommendation: Heart healthy  Filed Weights   12/29/24 0300 12/30/24 0300 12/31/24 0344  Weight: 36.7 kg 36.6 kg 36.3 kg    History of present illness:  64/F, frail with end-stage chronic systolic CHF, EF 79%, [employer] ICD, chronic hypoxic respiratory failure, CABG, paroxysmal A-fib, CKD 3B, COPD, prior history of lung cancer and type II DM, presented to the ED overnight with worsening shortness of breath and cough, when EMS arrived she was hypoxic in the low 70s on 3 L O2 with respiratory distress, placed on BiPAP and transported to the ED. - Tachypneic, hypoxic, proBNP 12.9 K, creatinine 1.07, troponin 44, WBC 7, hemoglobin 15, VBG without significant hypercarbia, chest x-ray with interstitial edema and pleural effusions - Admitted, cards following, started on  dobutamine , Lasix  gtt.  Hospital Course:   Acute on chronic systolic CHF, BiV failure End-stage heart failure Low output CHF - Followed by advanced heart failure clinic, EF has been 20-25% for approximately 5 years, - Last echo 1/25 noted EF less than 20% with moderate to severely reduced RV  - Frequent hospitalizations, on high-dose torsemide  at baseline, reports compliance - Concern for low output, worsening lactic acidosis, cards following, started dobutamine  1/18 - GDMT has been limited by chronic hypotension -CVP down, now off Lasix  drip, dobutamine  discontinued yesterday - Switched over to oral torsemide , 100 mg twice daily, continue digoxin , ivabradine , Farxiga , Aldactone  - Prognosis appears very poor, has been seen by palliative care numerous times, continues to refuse hospice, this was discussed again -High risk of quick readmission, follow-up made in heart failure clinic for next week 1/28   acute on chronic hypoxic respiratory failure-secondary to CHF COPD/chronic respiratory failure on 2 L O2 at baseline -Has underlying advanced COPD as well, respiratory virus panel is negative -Continue DuoNebs, off ABX   Elevated troponin-secondary to demand ischemia - Type II MI from demand in the setting of CHF   Paroxysmal atrial fibrillation -Continue Eliquis  2.5 mg twice daily   History of CAD status post CABG x 2 - Continue Eliquis  and statin.   History of lung cancer status post chemotherapy treatment completed 2015   CKD stage IIIb -Stable renal function continue to monitor   Non-insulin -dependent DM type II -Continue Farxiga  and sliding scale insulin  and mealtime coverage.   GERD - Continue Protonix .   Hypokalemia -replete   History of renal infraction - Continue Eliquis      DVT prophylaxis:  Eliquis , SCD TED hose  Code Status:  DNR/DNI  Discharge Exam: Vitals:   12/31/24 0344 12/31/24 0720  BP: (!) 93/55 101/63  Pulse: 87 90  Resp: 18 16  Temp: 98.7  F (37.1 C) 98 F (36.7 C)  SpO2: 100% 90%   General exam: Frail chronically ill cachectic, AO x 3, no distress HEENT:no JVD Respiratory system: Decreased at the bases Cardiovascular system: S1 & S2 heard, RRR.  Abd: nondistended, soft and nontender.Normal bowel sounds heard. Central nervous system: Alert and oriented. No focal neurological deficits. Extremities: Trace edema Skin: No rashes Psychiatry:  Mood & affect appropriate.     Discharge Instructions   Discharge Instructions     Increase activity slowly   Complete by: As directed       Allergies as of 12/31/2024       Reactions   Sulfa Antibiotics Hives   Patient requested to have this undeleted, had this reaction about 5 years ago.   Lactose Intolerance (gi) Diarrhea   Codeine Nausea And Vomiting        Medication List     STOP taking these medications    cyclobenzaprine  10 MG tablet Commonly known as: FLEXERIL    megestrol  20 MG tablet Commonly known as: MEGACE        TAKE these medications    albuterol  108 (90 Base) MCG/ACT inhaler Commonly known as: VENTOLIN  HFA Inhale 2 puffs into the lungs every 6 (six) hours as needed for wheezing or shortness of breath.   atorvastatin  80 MG tablet Commonly known as: LIPITOR  Take 1 tablet (80 mg total) by mouth daily.   dapagliflozin  propanediol 10 MG Tabs tablet Commonly known as: Farxiga  Take 1 tablet (10 mg total) by mouth daily.   Digoxin  62.5 MCG Tabs Take 0.0625 mg by mouth daily.   Eliquis  2.5 MG Tabs tablet Generic drug: apixaban  TAKE 1 TABLET BY MOUTH TWICE A DAY   Ensure Original Liqd Take 1 Bottle by mouth in the morning, at noon, in the evening, and at bedtime.   Furoscix  80 MG/10ML Ctkt Generic drug: Furosemide  Inject 80 mg into the skin daily as needed (fluid and edema). Take as directed by the heart failure clinic only   hydrOXYzine  25 MG capsule Commonly known as: VISTARIL  TAKE 1 CAPSULE (25 MG TOTAL) BY MOUTH DAILY AS  NEEDED.   ipratropium-albuterol  0.5-2.5 (3) MG/3ML Soln Commonly known as: DUONEB TAKE 3 MLS BY NEBULIZATION EVERY 4 (FOUR) HOURS AS NEEDED (FOR SHORTNESS OF BREATH).   ivabradine  7.5 MG Tabs tablet Commonly known as: CORLANOR  Take 1 tablet (7.5 mg total) by mouth 2 (two) times daily with a meal.   metolazone  5 MG tablet Commonly known as: ZAROXOLYN  Take 1 tablet (5 mg total) by mouth as needed (as instructed by Advanced HF clinic).   mirtazapine  15 MG tablet Commonly known as: REMERON  Take 1 tablet (15 mg total) by mouth at bedtime.   multivitamin with minerals Tabs tablet Take 1 tablet by mouth daily. Gummies   nitroGLYCERIN  0.4 MG SL tablet Commonly known as: NITROSTAT  Place 1 tablet (0.4 mg total) under the tongue every 5 (five) minutes as needed for chest pain.   pantoprazole  40 MG tablet Commonly known as: PROTONIX  Take 1 tablet (40 mg total) by mouth daily at 6 (six) AM.   potassium chloride  20 MEQ packet Commonly known as: KLOR-CON  Take 40 mEq by mouth 2 (two) times daily. What changed:  how much to take additional instructions   ROLAIDS PO Take 1 tablet by mouth as needed.  spironolactone  25 MG tablet Commonly known as: ALDACTONE  Take 1 tablet (25 mg total) by mouth daily.   torsemide  100 MG tablet Commonly known as: DEMADEX  Take 1 tablet (100 mg total) by mouth 2 (two) times daily.       Allergies[1]  Follow-up Information     Tanda Bleacher, MD Follow up on 01/12/2025.   Specialty: Family Medicine Why: 2:00 for hospital follow up,please bring your DC paperwork Contact information: [address] [place] [address] [phone]         [facility] Follow up on 01/07/2025.   Specialty: Cardiology Why: Advanced Heart Failure Clinic 2:30 PM Entrance C, Free Valet Parking Contact information: [address] [place]  [phone]                  The results of significant diagnostics from this hospitalization (including imaging, microbiology, ancillary and laboratory) are listed below for reference.    Significant Diagnostic Studies: ECHOCARDIOGRAM COMPLETE Result Date: 12/28/2024    ECHOCARDIOGRAM REPORT   Patient Name:   Sia MILLER-WASHINGTON  Date of Exam: 12/28/2024 Medical Rec #:  [id number]               Height:       59.0 in Accession #:    [id number]              Weight:       95.7 lb Date of Birth:  [birth year]               BSA:          1.348 m Patient Age:    63 years                BP:           103/57 mmHg Patient Gender: F                       HR:           106 bpm. Exam Location:  Inpatient Procedure: 2D Echo, Cardiac Doppler, Color Doppler and Intracardiac            Opacification Agent (Both Spectral and Color Flow Doppler were            utilized during procedure). Indications:    CHF - acute systolic  History:        Patient has prior history of Echocardiogram examinations, most                 recent 11/26/2023. CHF and Cardiomyopathy, Previous Myocardial                 Infarction and CAD, COPD; Risk Factors:Hypertension, Diabetes                 and Dyslipidemia.  Sonographer:    Odella Brewster Referring Phys: [id number] SUBRINA SUNDIL IMPRESSIONS  1. Left ventricular ejection fraction, by estimation, is <20%. The left ventricle has severely decreased function. The left ventricle has no regional wall motion abnormalities. The left ventricular internal cavity size was severely dilated. Indeterminate diastolic filling due to E-A fusion.  2. Right ventricular systolic function is mildly reduced. The right ventricular size is mildly enlarged. There is moderately elevated pulmonary artery systolic pressure. The estimated right ventricular systolic pressure is 52.1 mmHg.  3. Left atrial size was severely dilated.  4. Right atrial size was mildly dilated.  5. The  mitral valve is grossly normal. Moderate to severe mitral valve  regurgitation. No evidence of mitral stenosis.  6. Tricuspid valve regurgitation is mild to moderate.  7. The aortic valve is tricuspid. There is mild calcification of the aortic valve. Aortic valve regurgitation is severe. Aortic valve sclerosis is present, with no evidence of aortic valve stenosis.  8. The inferior vena cava is normal in size with <50% respiratory variability, suggesting right atrial pressure of 8 mmHg. Comparison(s): No significant change from prior study. Changes from prior study are noted. Moderate to severe mitral regurgitation is now present. Severe LV dysfunction and severe AI remain present. Conclusion(s)/Recommendation(s): No left ventricular mural or apical thrombus/thrombi. FINDINGS  Left Ventricle: Left ventricular ejection fraction, by estimation, is <20%. The left ventricle has severely decreased function. The left ventricle has no regional wall motion abnormalities. Definity  contrast agent was given IV to delineate the left ventricular endocardial borders. The left ventricular internal cavity size was severely dilated. There is no left ventricular hypertrophy. Indeterminate diastolic filling due to E-A fusion.  LV Wall Scoring: The entire septum and apex are akinetic. Right Ventricle: The right ventricular size is mildly enlarged. No increase in right ventricular wall thickness. Right ventricular systolic function is mildly reduced. There is moderately elevated pulmonary artery systolic pressure. The tricuspid regurgitant velocity is 3.32 m/s, and with an assumed right atrial pressure of 8 mmHg, the estimated right ventricular systolic pressure is 52.1 mmHg. Left Atrium: Left atrial size was severely dilated. Right Atrium: Right atrial size was mildly dilated. Pericardium: There is no evidence of pericardial effusion. Mitral Valve: The mitral valve is grossly normal. Moderate to severe mitral valve regurgitation. No evidence of mitral valve stenosis. Tricuspid Valve: The tricuspid  valve is grossly normal. Tricuspid valve regurgitation is mild to moderate. No evidence of tricuspid stenosis. Aortic Valve: The aortic valve is tricuspid. There is mild calcification of the aortic valve. Aortic valve regurgitation is severe. Aortic regurgitation PHT measures 54 msec. Aortic valve sclerosis is present, with no evidence of aortic valve stenosis. Aortic valve mean gradient measures 7.0 mmHg. Aortic valve peak gradient measures 12.5 mmHg. Aortic valve area, by VTI measures 1.88 cm. Pulmonic Valve: The pulmonic valve was grossly normal. Pulmonic valve regurgitation is not visualized. No evidence of pulmonic stenosis. Aorta: The aortic root and ascending aorta are structurally normal, with no evidence of dilitation. Venous: The inferior vena cava is normal in size with less than 50% respiratory variability, suggesting right atrial pressure of 8 mmHg. IAS/Shunts: There is right bowing of the interatrial septum, suggestive of elevated left atrial pressure. The atrial septum is grossly normal. Additional Comments: A device lead is visualized in the right atrium and right ventricle.  LEFT VENTRICLE PLAX 2D LVIDd:         6.40 cm      Diastology LVIDs:         6.00 cm      LV e' medial:    5.44 cm/s LV PW:         1.00 cm      LV E/e' medial:  13.3 LV IVS:        0.50 cm      LV e' lateral:   7.83 cm/s LVOT diam:     1.90 cm      LV E/e' lateral: 9.2 LV SV:         45 LV SV Index:   34 LVOT Area:     2.84 cm LV IVRT:  70 msec  LV Volumes (MOD) LV vol d, MOD A2C: 189.0 ml LV vol d, MOD A4C: 163.5 ml LV vol s, MOD A2C: 170.0 ml LV vol s, MOD A4C: 148.0 ml LV SV MOD A2C:     19.0 ml LV SV MOD A4C:     163.5 ml LV SV MOD BP:      15.1 ml RIGHT VENTRICLE            IVC RV S prime:     7.62 cm/s  IVC diam: 1.60 cm TAPSE (M-mode): 1.2 cm                            PULMONARY VEINS                            Diastolic Velocity: 82.30 cm/s                            S/D Velocity:       0.30                             Systolic Velocity:  24.10 cm/s LEFT ATRIUM             Index        RIGHT ATRIUM          Index LA diam:        4.40 cm 3.26 cm/m   RA Area:     9.50 cm LA Vol (A2C):   63.9 ml 47.40 ml/m  RA Volume:   20.90 ml 15.50 ml/m LA Vol (A4C):   50.1 ml 37.16 ml/m LA Biplane Vol: 59.5 ml 44.13 ml/m  AORTIC VALVE                     PULMONIC VALVE AV Area (Vmax):    2.03 cm      PV Vmax:       0.88 m/s AV Area (Vmean):   1.68 cm      PV Peak grad:  3.1 mmHg AV Area (VTI):     1.88 cm AV Vmax:           177.00 cm/s AV Vmean:          121.000 cm/s AV VTI:            0.240 m AV Peak Grad:      12.5 mmHg AV Mean Grad:      7.0 mmHg LVOT Vmax:         126.50 cm/s LVOT Vmean:        71.850 cm/s LVOT VTI:          0.160 m LVOT/AV VTI ratio: 0.66 AI PHT:            54 msec  AORTA Ao Root diam: 2.80 cm Ao Asc diam:  2.80 cm MITRAL VALVE               TRICUSPID VALVE MV Area (PHT): 6.60 cm    TR Peak grad:   44.1 mmHg MV Decel Time: 115 msec    TR Vmax:        332.00 cm/s MV E velocity: 72.30 cm/s MV A velocity: 47.20 cm/s  SHUNTS MV E/A ratio:  1.53        Systemic VTI:  0.16 m                            Systemic Diam: 1.90 cm Darryle Decent MD Electronically signed by Darryle Decent MD Signature Date/Time: 12/28/2024/4:23:51 PM    Final    US  EKG SITE RITE Result Date: 12/28/2024 If Site Rite image not attached, placement could not be confirmed due to current cardiac rhythm.  US  EKG SITE RITE Result Date: 12/28/2024 If Site Rite image not attached, placement could not be confirmed due to current cardiac rhythm.  DG Chest Portable 1 View Result Date: 12/27/2024 EXAM: 1 VIEW(S) XRAY OF THE CHEST 12/27/2024 03:46:00 AM COMPARISON: 12/26/2024 CLINICAL HISTORY: Shortness of breath Shortness of breath Shortness of breath Shortness of breath Shortness of breath FINDINGS: LINES, TUBES AND DEVICES: Implanted stimulator device overlies right hemithorax extending into neck. Stable left subclavian AICD. LUNGS AND PLEURA:  Mild perihilar interstitial edema, slightly increased. Small pleural effusions. No focal pulmonary opacity. No pneumothorax. HEART AND MEDIASTINUM: Stable cardiomegaly. Aortic atherosclerosis. CABG markers noted. Stable left subclavian AICD. BONES AND SOFT TISSUES: Sternotomy wires noted. Implanted stimulator device overlies right hemithorax extending into neck. No acute osseous abnormality. IMPRESSION: 1. Mild perihilar interstitial edema, slightly increased. 2. Small pleural effusions. Electronically signed by: Evalene Coho MD 12/27/2024 04:00 AM EST RP Workstation: [id number]   DG Chest Portable 1 View Result Date: 12/26/2024 CLINICAL DATA:  Shortness of breath EXAM: PORTABLE CHEST 1 VIEW COMPARISON:  08/18/2024 FINDINGS: Pain post sternotomy changes. Left-sided pacing device. Right-sided stimulator generator with ascending lead. Cardiomegaly. Small pleural effusions are suspected. No focal consolidation. Aortic atherosclerosis. IMPRESSION: Cardiomegaly with small pleural effusions. Electronically Signed   By: Luke Bun M.D.   On: 12/26/2024 20:11    Microbiology: Recent Results (from the past 240 hours)  Respiratory (~20 pathogens) panel by PCR     Status: None   Collection Time: 12/26/24 10:29 PM   Specimen: Nasopharyngeal Swab; Respiratory  Result Value Ref Range Status   Adenovirus NOT DETECTED NOT DETECTED Final   Coronavirus 229E NOT DETECTED NOT DETECTED Final    Comment: (NOTE) The Coronavirus on the Respiratory Panel, DOES NOT test for the novel  Coronavirus (2019 nCoV)    Coronavirus HKU1 NOT DETECTED NOT DETECTED Final   Coronavirus NL63 NOT DETECTED NOT DETECTED Final   Coronavirus OC43 NOT DETECTED NOT DETECTED Final   Metapneumovirus NOT DETECTED NOT DETECTED Final   Rhinovirus / Enterovirus NOT DETECTED NOT DETECTED Final   Influenza A NOT DETECTED NOT DETECTED Final   Influenza B NOT DETECTED NOT DETECTED Final   Parainfluenza Virus 1 NOT DETECTED NOT DETECTED  Final   Parainfluenza Virus 2 NOT DETECTED NOT DETECTED Final   Parainfluenza Virus 3 NOT DETECTED NOT DETECTED Final   Parainfluenza Virus 4 NOT DETECTED NOT DETECTED Final   Respiratory Syncytial Virus NOT DETECTED NOT DETECTED Final   Bordetella pertussis NOT DETECTED NOT DETECTED Final   Bordetella Parapertussis NOT DETECTED NOT DETECTED Final   Chlamydophila pneumoniae NOT DETECTED NOT DETECTED Final   Mycoplasma pneumoniae NOT DETECTED NOT DETECTED Final    Comment: Performed at [facility], [address], [place], [place] [address]     Labs: Basic Metabolic Panel: Recent Labs  Lab 12/27/24 0447 12/28/24 0239 12/29/24 0510 12/30/24 0652 12/31/24 0500  NA 134* 139 135 134* 134*  K 4.3 3.3* 3.0* 3.2* 3.1*  CL 99 95* 85* 88* 84*  CO2 19* 27 36*  31 35*  GLUCOSE 288* 87 156* 150* 118*  BUN 39* 52* 63* 64* 71*  CREATININE 1.25* 1.49* 1.73* 1.53* 1.74*  CALCIUM  10.0 10.3 10.9* 9.5 10.0  MG  --   --  2.5* 2.5* 2.7*   Liver Function Tests: Recent Labs  Lab 12/26/24 2003 12/27/24 0447  AST 36 32  ALT 29 28  ALKPHOS 93 85  BILITOT 0.8 0.9  PROT 7.1 7.2  ALBUMIN  4.2 4.5   No results for input(s): LIPASE, AMYLASE in the last 168 hours. No results for input(s): AMMONIA in the last 168 hours. CBC: Recent Labs  Lab 12/26/24 2003 12/26/24 2038 12/27/24 0447 12/28/24 0239  WBC 7.5  --  7.5 9.3  NEUTROABS 5.6  --   --   --   HGB 15.0 15.6* 13.9 14.7  HCT 46.5* 46.0 44.3 44.8  MCV 91.9  --  91.5 88.5  PLT 208  --  197 228   Cardiac Enzymes: No results for input(s): CKTOTAL, CKMB, CKMBINDEX, TROPONINI in the last 168 hours. BNP: BNP (last 3 results) Recent Labs    09/16/24 1115 10/22/24 1526 11/17/24 1618  BNP 1,596.2* 1,589.7* 1,898.1*    ProBNP (last 3 results) Recent Labs    12/26/24 2003  PROBNP 12,902.0*    CBG: Recent Labs  Lab 12/30/24 0621 12/30/24 1157 12/30/24 1613 12/30/24 2127 12/31/24 0618  GLUCAP 149* 176* 269*  78 177*       Signed:  Sigurd Pac MD.  [facility] 12/31/2024, 9:17 AM        [1]  Allergies Allergen Reactions   Sulfa Antibiotics Hives    Patient requested to have this undeleted, had this reaction about 5 years ago.   Lactose Intolerance (Gi) Diarrhea   Codeine Nausea And Vomiting

## 2024-12-31 NOTE — Evaluation (Signed)
 Physical Therapy Evaluation Patient Details Name: Brittany [name]  MRN: [id number] DOB: 03-07-1961 Today's Date: 12/31/2024  History of Present Illness  64 y.o. female adm 12/26/24 with acute respiratory distress, acute on chronic HF. PMH: COPD on 3L, PAF, previous smoker, HTN, SCLC with chemo, chest XRT, and prophylactic brain radiation 2015, CAD s/p CABG 2019 and DES 2020, R renal infarct 2022, CHF with EF 25%, ICD, DM2, HLD   Clinical Impression  PTA pt was ModI with use of rollator for short distances. Pt presents with generalized weakness, decreased activity tolerance, and impaired balance/gait pattern. Pt was ModI for bed mobility and CGA to stand with no UE support. Pt was agreeable to ambulate a few feet with CGA pt declining further distance as to not mess up the purewick placement. Discussed ambulating in the afternoon with staff with pt verbalizing agreement. Pt has intermittent assist upon d/c home from sister with more assist available if needed. Pt declined post-acute PT services, however, was agreeable for continued acute PT services.       If plan is discharge home, recommend the following: A little help with walking and/or transfers;Assist for transportation;Help with stairs or ramp for entrance   Can travel by private vehicle    Yes    Equipment Recommendations None recommended by PT     Functional Status Assessment Patient has had a recent decline in their functional status and demonstrates the ability to make significant improvements in function in a reasonable and predictable amount of time.     Precautions / Restrictions Precautions Precautions: Fall Recall of Precautions/Restrictions: Intact Restrictions Weight Bearing Restrictions Per Provider Order: No      Mobility  Bed Mobility Overal bed mobility: Modified Independent    General bed mobility comments: increased time    Transfers Overall transfer level: Needs assistance Equipment used:  None Transfers: Sit to/from Stand Sit to Stand: Contact guard assist   General transfer comment: CGA to stand with no UE support with pt bracing BLE on back of bed    Ambulation/Gait Ambulation/Gait assistance: Contact guard assist Gait Distance (Feet): 6 Feet Assistive device: None Gait Pattern/deviations: Step-through pattern, Decreased stride length Gait velocity: decr    General Gait Details: Able to take a few steps forwards/backwards with no UE support. Slightly unsteady with no UE support. Distance limited by pt not wanting to mess up purewick placement    Balance Overall balance assessment: Needs assistance, Mild deficits observed, not formally tested Sitting-balance support: No upper extremity supported, Feet supported Sitting balance-Leahy Scale: Good   Standing balance support: No upper extremity supported Standing balance-Leahy Scale: Fair Standing balance comment: able to stand statically with no UE support       Pertinent Vitals/Pain Pain Assessment Pain Assessment: No/denies pain    Home Living Family/patient expects to be discharged to:: Private residence Living Arrangements: Alone Available Help at Discharge: Family;Friend(s);Available PRN/intermittently Type of Home: Apartment (studio) Home Access: Level entry    Home Layout: One level Home Equipment: Educational Psychologist (4 wheels);BSC/3in1;Grab bars - tub/shower;Grab bars - toilet;Other (comment)      Prior Function Prior Level of Function : Independent/Modified Independent    Mobility Comments: ModI with rollator for short distances, uses home O2, BiPaP ADLs Comments: Indep with ADLs. Pt states she doesn't drive often. Manages her own medications, finances, etc.     Extremity/Trunk Assessment   Upper Extremity Assessment Upper Extremity Assessment: Defer to OT evaluation    Lower Extremity Assessment Lower Extremity Assessment: Generalized weakness  Cervical / Trunk  Assessment Cervical / Trunk Assessment: Normal  Communication   Communication Communication: No apparent difficulties    Cognition Arousal: Alert Behavior During Therapy: WFL for tasks assessed/performed   PT - Cognitive impairments: No apparent impairments    Following commands: Intact       Cueing Cueing Techniques: Verbal cues, Tactile cues     General Comments General comments (skin integrity, edema, etc.): VSS on 2L     PT Assessment Patient needs continued PT services  PT Problem List Decreased strength;Decreased activity tolerance;Decreased balance;Decreased mobility       PT Treatment Interventions DME instruction;Gait training;Functional mobility training;Therapeutic exercise;Therapeutic activities;Balance training;Neuromuscular re-education;Patient/family education    PT Goals (Current goals can be found in the Care Plan section)  Acute Rehab PT Goals Patient Stated Goal: to go home PT Goal Formulation: With patient Time For Goal Achievement: 01/14/25 Potential to Achieve Goals: Good    Frequency Min 2X/week        AM-PAC PT 6 Clicks Mobility  Outcome Measure Help needed turning from your back to your side while in a flat bed without using bedrails?: None Help needed moving from lying on your back to sitting on the side of a flat bed without using bedrails?: None Help needed moving to and from a bed to a chair (including a wheelchair)?: A Little Help needed standing up from a chair using your arms (e.g., wheelchair or bedside chair)?: A Little Help needed to walk in hospital room?: A Little Help needed climbing 3-5 steps with a railing? : A Lot 6 Click Score: 19    End of Session Equipment Utilized During Treatment: Oxygen Activity Tolerance: Patient tolerated treatment well Patient left: in bed;with call bell/phone within reach;with bed alarm set Nurse Communication: Mobility status PT Visit Diagnosis: Unsteadiness on feet (R26.81);Other  abnormalities of gait and mobility (R26.89);Muscle weakness (generalized) (M62.81)    Time: 9074-9059 PT Time Calculation (min) (ACUTE ONLY): 15 min   Charges:   PT Evaluation $PT Eval Low Complexity: 1 Low   PT General Charges $$ ACUTE PT VISIT: 1 Visit       Kate ORN, PT, DPT Secure Chat Preferred  Rehab Office [phone]   Kate BRAVO Wendolyn 12/31/2024, 10:23 AM

## 2024-12-31 NOTE — Telephone Encounter (Signed)
 Pharmacy Patient Advocate Encounter  Received notification from [employer] that Prior Authorization for Farxiga  10mg  tablets has been APPROVED from 12/31/24 to 12/31/25 with a 30 day copay of $45.   PA #/Case ID/Reference #: [id number]

## 2024-12-31 NOTE — Progress Notes (Signed)
 OT Cancellation Note  Patient Details Name: Brittany [name]  MRN: [id number] DOB: 1961/06/07   Cancelled Treatment:    Reason Eval/Treat Not Completed: Patient declined, no reason specified.   Elma JONETTA Lebron FREDERICK, OTR/L [facility] Acute Rehabilitation Office: [phone]   Elma JONETTA Lebron 12/31/2024, 1:36 PM

## 2025-01-01 ENCOUNTER — Telehealth: Payer: Self-pay | Admitting: *Deleted

## 2025-01-01 LAB — CUP PACEART REMOTE DEVICE CHECK
Battery Remaining Longevity: 68 mo
Battery Voltage: 2.98 V
Brady Statistic RV Percent Paced: 0 %
Date Time Interrogation Session: 20260121191728
HighPow Impedance: 84 Ohm
Implantable Lead Connection Status: 753985
Implantable Lead Implant Date: 20190905
Implantable Lead Location: 753860
Implantable Pulse Generator Implant Date: 20190905
Lead Channel Impedance Value: 380 Ohm
Lead Channel Impedance Value: 456 Ohm
Lead Channel Pacing Threshold Amplitude: 0.75 V
Lead Channel Pacing Threshold Pulse Width: 0.4 ms
Lead Channel Sensing Intrinsic Amplitude: 27.375 mV
Lead Channel Sensing Intrinsic Amplitude: 27.375 mV
Lead Channel Setting Pacing Amplitude: 2 V
Lead Channel Setting Pacing Pulse Width: 0.4 ms
Lead Channel Setting Sensing Sensitivity: 0.3 mV
Zone Setting Status: 755011
Zone Setting Status: 755011

## 2025-01-01 NOTE — Transitions of Care (Post Inpatient/ED Visit) (Signed)
" ° °  01/01/2025  Name: Brittany [name]  MRN: [id number] DOB: Jul 19, 1961  Today's TOC FU Call Status: Today's TOC FU Call Status:: Unsuccessful Call (1st Attempt) Unsuccessful Call (1st Attempt) Date: 01/01/25  Attempted to reach the patient regarding the most recent Inpatient/ED visit.  Follow Up Plan: Additional outreach attempts will be made to reach the patient to complete the Transitions of Care (Post Inpatient/ED visit) call.   Andrea Dimes RN, BSN [place]  [facility] RN Care Manager [phone]  "

## 2025-01-02 ENCOUNTER — Telehealth: Payer: Self-pay | Admitting: *Deleted

## 2025-01-02 NOTE — Progress Notes (Signed)
 Remote ICD Transmission

## 2025-01-02 NOTE — Transitions of Care (Post Inpatient/ED Visit) (Signed)
" ° °  01/02/2025  Name: Brittany [name]  MRN: [id number] DOB: [birth year]  Today's TOC FU Call Status: Today's TOC FU Call Status:: Unsuccessful Call (2nd Attempt) Unsuccessful Call (2nd Attempt) Date: 01/02/25  Attempted to reach the patient regarding the most recent Inpatient/ED visit.  Follow Up Plan: Additional outreach attempts will be made to reach the patient to complete the Transitions of Care (Post Inpatient/ED visit) call.   Andrea Dimes RN, BSN [place]  [facility] RN Care Manager [phone]  "

## 2025-01-05 ENCOUNTER — Ambulatory Visit

## 2025-01-05 ENCOUNTER — Ambulatory Visit: Payer: Self-pay | Admitting: Cardiology

## 2025-01-05 DIAGNOSIS — Z95810 Presence of automatic (implantable) cardiac defibrillator: Secondary | ICD-10-CM

## 2025-01-05 DIAGNOSIS — I5022 Chronic systolic (congestive) heart failure: Secondary | ICD-10-CM

## 2025-01-07 ENCOUNTER — Ambulatory Visit (HOSPITAL_COMMUNITY)

## 2025-01-07 ENCOUNTER — Telehealth: Payer: Self-pay

## 2025-01-07 NOTE — Telephone Encounter (Signed)
 Attempted ICM call to request manual remote transmission but no answer or voice mail option.

## 2025-01-07 NOTE — Telephone Encounter (Signed)
 Pt returned call.  She canceled HF clinic appt due to she has a cold and not feeling well.  She attempted to send remote transmission but unable to do so.  Will try again in tomorrow morning. She will call 911 if needed if cold symptoms get worse.  She plans on call [facility] tomorrow to get refill of Furoscix  since she only has 1 dose left.

## 2025-01-08 ENCOUNTER — Other Ambulatory Visit: Payer: Self-pay

## 2025-01-08 ENCOUNTER — Telehealth (HOSPITAL_COMMUNITY): Payer: Self-pay

## 2025-01-08 ENCOUNTER — Other Ambulatory Visit (HOSPITAL_COMMUNITY): Payer: Self-pay | Admitting: *Deleted

## 2025-01-08 ENCOUNTER — Other Ambulatory Visit (HOSPITAL_COMMUNITY): Payer: Self-pay

## 2025-01-08 MED ORDER — FUROSCIX 80 MG/10ML ~~LOC~~ CTKT
80.0000 mg | CARTRIDGE | Freq: Every day | SUBCUTANEOUS | 0 refills | Status: AC | PRN
  Filled 2025-01-08 – 2025-01-15 (×3): qty 10, 10d supply, fill #0

## 2025-01-08 NOTE — Telephone Encounter (Signed)
 Per Jordan Lee, NP ok for pt to use Furoscix . Called and spoke w/pt, she states she tried to use kit earlier but it was defective and it was her last one, advised refill has been sent to [facility] specialty pharmacy but requires prior auth which was started and is in process. She will take Metolazone  5 mg tonight along with Torsemide  dose. Advised will f/u with her tomorrow, ER precautions given.

## 2025-01-08 NOTE — Telephone Encounter (Signed)
 Remote Transmission Received for Mitzie Garner RN for HF monitoring:  Normal Device Function No new episodes HF logistics abnormal.  Optivol (trending up again),Heart rate variability and elevations.  Presenting is mildly tachycardic with rate of 111 bpm.  HG's show elevated heart rate trend between 1/21 and 1/29, c/w acute illness.  Patient reports feeling acutely ill with possible respiratory infection v/s other acute etiologies.   Forwarding to HF clinic for next steps.

## 2025-01-08 NOTE — Progress Notes (Signed)
 EPIC Encounter for ICM Monitoring  Patient Name: Brittany [name]  is a 64 y.o. female Date: 01/08/2025 Primary Care Physican: Tanda Bleacher, MD Primary Cardiologist: Bensimhon Electrophysiologist:  Kennyth 01/03/2024 Weight: 93-94 lbs  01/22/2024 Weight: 91 lbs (lowest weight 84 lbs) 04/18/2024 Office Weight:  99 lbs 05/27/2024 Weight: 87 lbs 07/14/2024 Weight: 89 lbs 10/06/2024 Weight: 89.10 lbs 10/28/2024 Weight: 87 lbs   Spoke with patient and heart failure questions reviewed.  Transmission results reviewed.  Pt has a cold with a lot of congestion.  She knows she has some fluid and plans to take Furoscix  today.   Hospitalized 12/26/2024-12/31/2024   Since 12/24/2024 ICM Remote Transmission: Optivol Thoracic impedance suggesting possible fluid starting 01/02/2025.  Pt was diuresed during hospitalization (discharge 12/31/2024).     [name], PA at the HF clinic requested Optivol to be reset since ReDS showed 17% at 11/17/2024 HF clinic office which does not correlate with impedance.       Prescribed: Torsemide  100 mg take 1 tablet (s) (100 mg total) by mouth two times a day.  Potassium 20 mEq packet take 80 mEq total by mouth twice day with Furoscix  and extra 40 mEq when using Metolazone .   Spironolactone  25 mg take 1 tablet (25 mg total) by mouth daily Furosemide  (Furoscix ) 80 mg/10 ml inject into skin daily as needed (fluid and edema). Take as directed by HF clinic.     Labs: 12/31/2024 Creatinine 1.74, BUN 71, Potassium 3.1, Sodium 134, GFR 32  12/30/2024 Creatinine 1.53, BUN 64, Potassium 3.2, Sodium 134, GFR 38  12/29/2024 Creatinine 1.73, BUN 63, Potassium 3.0, Sodium 135, GFR 32  12/28/2024 Creatinine 1.49, BUN 52, Potassium 3.3, Sodium 139, GFR 39  12/27/2024 Creatinine 1.25, BUN 39, Potassium 4.3, Sodium 134, GFR 48  A complete set of results can be found in Results Review.   Recommendations:  Pt plans on taking Furoscix  today, 01/08/2025.  She contacted HF clinic today,  01/08/2025, to request another order of Furoscix  since she will using her last one.  Copy sent to HF clinic as FYI and review.  Pt will call 911 if her symptoms get worse.   Copy sent to [name], NP and [facility] Triage pool.     31 day ICM Remote transmission canceled due to [facility] clinic is on hold until further notice.  91 day remote monitoring will continue per protocol.   91 day device clinic remote transmission 03/30/2025.     EP/Cardiology Office Visits:  01/19/2025 with HF Clinic.   Recall 07/26/2024 with [name], PA   Copy of ICM check sent to Dr. Kennyth.    Remote monitoring is medically necessary for Heart Failure Management.    Daily Thoracic Impedance ICM trend: 10/09/2024 through 01/08/2025.    12-14 Month Thoracic Impedance ICM trend:     [name], RN 01/08/2025 12:09 PM

## 2025-01-08 NOTE — Telephone Encounter (Signed)
 Patient called wanting a refill of Furoscix ; tried calling patient back to get more information. Tried calling patient, no answer,unable to leave message. Will try again later.

## 2025-01-09 ENCOUNTER — Other Ambulatory Visit: Payer: Self-pay

## 2025-01-12 ENCOUNTER — Other Ambulatory Visit (HOSPITAL_COMMUNITY): Payer: Self-pay

## 2025-01-12 ENCOUNTER — Inpatient Hospital Stay: Payer: Self-pay | Admitting: Family Medicine

## 2025-01-14 ENCOUNTER — Inpatient Hospital Stay (HOSPITAL_COMMUNITY)
Admission: EM | Admit: 2025-01-14 | Source: Home / Self Care | Attending: Internal Medicine | Admitting: Internal Medicine

## 2025-01-14 ENCOUNTER — Emergency Department (HOSPITAL_COMMUNITY)

## 2025-01-14 ENCOUNTER — Encounter (HOSPITAL_COMMUNITY): Payer: Self-pay

## 2025-01-14 ENCOUNTER — Other Ambulatory Visit: Payer: Self-pay

## 2025-01-14 DIAGNOSIS — D72829 Elevated white blood cell count, unspecified: Secondary | ICD-10-CM | POA: Diagnosis not present

## 2025-01-14 DIAGNOSIS — I5043 Acute on chronic combined systolic (congestive) and diastolic (congestive) heart failure: Secondary | ICD-10-CM | POA: Diagnosis not present

## 2025-01-14 DIAGNOSIS — Z85118 Personal history of other malignant neoplasm of bronchus and lung: Secondary | ICD-10-CM

## 2025-01-14 DIAGNOSIS — J9621 Acute and chronic respiratory failure with hypoxia: Secondary | ICD-10-CM | POA: Diagnosis present

## 2025-01-14 DIAGNOSIS — C3490 Malignant neoplasm of unspecified part of unspecified bronchus or lung: Secondary | ICD-10-CM | POA: Diagnosis present

## 2025-01-14 DIAGNOSIS — J441 Chronic obstructive pulmonary disease with (acute) exacerbation: Secondary | ICD-10-CM | POA: Diagnosis present

## 2025-01-14 DIAGNOSIS — I480 Paroxysmal atrial fibrillation: Secondary | ICD-10-CM | POA: Diagnosis not present

## 2025-01-14 DIAGNOSIS — E119 Type 2 diabetes mellitus without complications: Secondary | ICD-10-CM

## 2025-01-14 DIAGNOSIS — I5022 Chronic systolic (congestive) heart failure: Secondary | ICD-10-CM

## 2025-01-14 DIAGNOSIS — I2510 Atherosclerotic heart disease of native coronary artery without angina pectoris: Secondary | ICD-10-CM | POA: Diagnosis present

## 2025-01-14 DIAGNOSIS — I509 Heart failure, unspecified: Secondary | ICD-10-CM

## 2025-01-14 DIAGNOSIS — E876 Hypokalemia: Secondary | ICD-10-CM | POA: Insufficient documentation

## 2025-01-14 DIAGNOSIS — N280 Ischemia and infarction of kidney: Secondary | ICD-10-CM | POA: Diagnosis not present

## 2025-01-14 DIAGNOSIS — N1832 Chronic kidney disease, stage 3b: Secondary | ICD-10-CM | POA: Diagnosis present

## 2025-01-14 DIAGNOSIS — I10 Essential (primary) hypertension: Secondary | ICD-10-CM | POA: Diagnosis present

## 2025-01-14 DIAGNOSIS — N183 Chronic kidney disease, stage 3 unspecified: Secondary | ICD-10-CM | POA: Diagnosis present

## 2025-01-14 DIAGNOSIS — I5023 Acute on chronic systolic (congestive) heart failure: Secondary | ICD-10-CM | POA: Diagnosis present

## 2025-01-14 DIAGNOSIS — J9620 Acute and chronic respiratory failure, unspecified whether with hypoxia or hypercapnia: Principal | ICD-10-CM

## 2025-01-14 LAB — CBC WITH DIFFERENTIAL/PLATELET
Abs Immature Granulocytes: 0.1 10*3/uL — ABNORMAL HIGH (ref 0.00–0.07)
Basophils Absolute: 0.2 10*3/uL — ABNORMAL HIGH (ref 0.0–0.1)
Basophils Relative: 1 %
Eosinophils Absolute: 0.3 10*3/uL (ref 0.0–0.5)
Eosinophils Relative: 2 %
HCT: 47 % — ABNORMAL HIGH (ref 36.0–46.0)
Hemoglobin: 15.4 g/dL — ABNORMAL HIGH (ref 12.0–15.0)
Immature Granulocytes: 1 %
Lymphocytes Relative: 14 %
Lymphs Abs: 1.8 10*3/uL (ref 0.7–4.0)
MCH: 29.3 pg (ref 26.0–34.0)
MCHC: 32.8 g/dL (ref 30.0–36.0)
MCV: 89.5 fL (ref 80.0–100.0)
Monocytes Absolute: 0.9 10*3/uL (ref 0.1–1.0)
Monocytes Relative: 6 %
Neutro Abs: 10.2 10*3/uL — ABNORMAL HIGH (ref 1.7–7.7)
Neutrophils Relative %: 76 %
Platelets: 270 10*3/uL (ref 150–400)
RBC: 5.25 MIL/uL — ABNORMAL HIGH (ref 3.87–5.11)
RDW: 17.2 % — ABNORMAL HIGH (ref 11.5–15.5)
WBC: 13.4 10*3/uL — ABNORMAL HIGH (ref 4.0–10.5)
nRBC: 0 % (ref 0.0–0.2)

## 2025-01-14 LAB — RESP PANEL BY RT-PCR (RSV, FLU A&B, COVID)  RVPGX2
Influenza A by PCR: NEGATIVE
Influenza B by PCR: NEGATIVE
Resp Syncytial Virus by PCR: NEGATIVE
SARS Coronavirus 2 by RT PCR: NEGATIVE

## 2025-01-14 LAB — I-STAT CHEM 8, ED
BUN: 46 mg/dL — ABNORMAL HIGH (ref 8–23)
Calcium, Ion: 1.2 mmol/L (ref 1.15–1.40)
Chloride: 91 mmol/L — ABNORMAL LOW (ref 98–111)
Creatinine, Ser: 1.5 mg/dL — ABNORMAL HIGH (ref 0.44–1.00)
Glucose, Bld: 339 mg/dL — ABNORMAL HIGH (ref 70–99)
HCT: 51 % — ABNORMAL HIGH (ref 36.0–46.0)
Hemoglobin: 17.3 g/dL — ABNORMAL HIGH (ref 12.0–15.0)
Potassium: 2.9 mmol/L — ABNORMAL LOW (ref 3.5–5.1)
Sodium: 134 mmol/L — ABNORMAL LOW (ref 135–145)
TCO2: 30 mmol/L (ref 22–32)

## 2025-01-14 LAB — I-STAT CG4 LACTIC ACID, ED
Lactic Acid, Venous: 2.2 mmol/L (ref 0.5–1.9)
Lactic Acid, Venous: 5 mmol/L (ref 0.5–1.9)

## 2025-01-14 LAB — CBG MONITORING, ED
Glucose-Capillary: 122 mg/dL — ABNORMAL HIGH (ref 70–99)
Glucose-Capillary: <10 mg/dL — CL (ref 70–99)
Glucose-Capillary: 283 mg/dL — ABNORMAL HIGH (ref 70–99)
Glucose-Capillary: 289 mg/dL — ABNORMAL HIGH (ref 70–99)
Glucose-Capillary: 229 mg/dL — ABNORMAL HIGH (ref 70–99)

## 2025-01-14 LAB — TROPONIN T, HIGH SENSITIVITY
Troponin T High Sensitivity: 71 ng/L — ABNORMAL HIGH (ref 0–19)
Troponin T High Sensitivity: 66 ng/L — ABNORMAL HIGH (ref 0–19)

## 2025-01-14 LAB — COMPREHENSIVE METABOLIC PANEL WITH GFR
ALT: 29 U/L (ref 0–44)
AST: 40 U/L (ref 15–41)
Albumin: 4.1 g/dL (ref 3.5–5.0)
Alkaline Phosphatase: 107 U/L (ref 38–126)
Anion gap: 16 — ABNORMAL HIGH (ref 5–15)
BUN: 45 mg/dL — ABNORMAL HIGH (ref 8–23)
CO2: 29 mmol/L (ref 22–32)
Calcium: 10.4 mg/dL — ABNORMAL HIGH (ref 8.9–10.3)
Chloride: 90 mmol/L — ABNORMAL LOW (ref 98–111)
Creatinine, Ser: 1.32 mg/dL — ABNORMAL HIGH (ref 0.44–1.00)
GFR, Estimated: 45 mL/min — ABNORMAL LOW
Glucose, Bld: 370 mg/dL — ABNORMAL HIGH (ref 70–99)
Potassium: 3.1 mmol/L — ABNORMAL LOW (ref 3.5–5.1)
Sodium: 134 mmol/L — ABNORMAL LOW (ref 135–145)
Total Bilirubin: 0.5 mg/dL (ref 0.0–1.2)
Total Protein: 7.4 g/dL (ref 6.5–8.1)

## 2025-01-14 LAB — I-STAT VENOUS BLOOD GAS, ED
Acid-Base Excess: 4 mmol/L — ABNORMAL HIGH (ref 0.0–2.0)
Bicarbonate: 31.8 mmol/L — ABNORMAL HIGH (ref 20.0–28.0)
Calcium, Ion: 1.21 mmol/L (ref 1.15–1.40)
HCT: 50 % — ABNORMAL HIGH (ref 36.0–46.0)
Hemoglobin: 17 g/dL — ABNORMAL HIGH (ref 12.0–15.0)
O2 Saturation: 77 %
Potassium: 2.9 mmol/L — ABNORMAL LOW (ref 3.5–5.1)
Sodium: 132 mmol/L — ABNORMAL LOW (ref 135–145)
TCO2: 34 mmol/L — ABNORMAL HIGH (ref 22–32)
pCO2, Ven: 59 mmHg (ref 44–60)
pH, Ven: 7.339 (ref 7.25–7.43)
pO2, Ven: 46 mmHg — ABNORMAL HIGH (ref 32–45)

## 2025-01-14 LAB — PRO BRAIN NATRIURETIC PEPTIDE: Pro Brain Natriuretic Peptide: 13411 pg/mL — ABNORMAL HIGH

## 2025-01-14 LAB — HIV ANTIBODY (ROUTINE TESTING W REFLEX): HIV Screen 4th Generation wRfx: NONREACTIVE

## 2025-01-14 LAB — PROTIME-INR
INR: 1.1 (ref 0.8–1.2)
Prothrombin Time: 15 s (ref 11.4–15.2)

## 2025-01-14 LAB — LACTIC ACID, PLASMA: Lactic Acid, Venous: 5 mmol/L (ref 0.5–1.9)

## 2025-01-14 LAB — DIGOXIN LEVEL: Digoxin Level: <0.6 ng/mL — ABNORMAL LOW (ref 0.8–2.0)

## 2025-01-14 LAB — PROCALCITONIN: Procalcitonin: 0.74 ng/mL

## 2025-01-14 MED ORDER — DIGOXIN 0.0625 MG HALF TABLET
0.0625 mg | ORAL_TABLET | Freq: Every day | ORAL | Status: AC
  Administered 2025-01-14 – 2025-01-16 (×3): 0.0625 mg via ORAL
  Filled 2025-01-14 (×3): qty 1

## 2025-01-14 MED ORDER — PANTOPRAZOLE SODIUM 40 MG PO TBEC
40.0000 mg | DELAYED_RELEASE_TABLET | Freq: Every day | ORAL | Status: AC
  Administered 2025-01-14 – 2025-01-16 (×3): 40 mg via ORAL
  Filled 2025-01-14 (×3): qty 1

## 2025-01-14 MED ORDER — INSULIN ASPART 100 UNIT/ML IJ SOLN
0.0000 [IU] | Freq: Three times a day (TID) | INTRAMUSCULAR | Status: AC
  Administered 2025-01-14: 5 [IU] via SUBCUTANEOUS
  Administered 2025-01-15: 3 [IU] via SUBCUTANEOUS
  Administered 2025-01-15: 5 [IU] via SUBCUTANEOUS
  Administered 2025-01-15: 2 [IU] via SUBCUTANEOUS
  Administered 2025-01-16: 1 [IU] via SUBCUTANEOUS
  Administered 2025-01-16: 3 [IU] via SUBCUTANEOUS
  Administered 2025-01-16: 2 [IU] via SUBCUTANEOUS
  Filled 2025-01-14: qty 4
  Filled 2025-01-14: qty 5
  Filled 2025-01-14: qty 2
  Filled 2025-01-14: qty 5
  Filled 2025-01-14: qty 2
  Filled 2025-01-14: qty 3
  Filled 2025-01-14: qty 1

## 2025-01-14 MED ORDER — IVABRADINE HCL 5 MG PO TABS
7.5000 mg | ORAL_TABLET | Freq: Two times a day (BID) | ORAL | Status: AC
  Administered 2025-01-14 – 2025-01-16 (×5): 7.5 mg via ORAL
  Filled 2025-01-14 (×6): qty 1

## 2025-01-14 MED ORDER — DOBUTAMINE-DEXTROSE 4-5 MG/ML-% IV SOLN
5.0000 ug/kg/min | INTRAVENOUS | Status: AC
  Filled 2025-01-14: qty 250

## 2025-01-14 MED ORDER — DAPAGLIFLOZIN PROPANEDIOL 10 MG PO TABS
10.0000 mg | ORAL_TABLET | Freq: Every day | ORAL | Status: AC
  Administered 2025-01-14 – 2025-01-16 (×3): 10 mg via ORAL
  Filled 2025-01-14 (×3): qty 1

## 2025-01-14 MED ORDER — ATORVASTATIN CALCIUM 80 MG PO TABS
80.0000 mg | ORAL_TABLET | Freq: Every day | ORAL | Status: AC
  Administered 2025-01-15 – 2025-01-16 (×2): 80 mg via ORAL
  Filled 2025-01-14: qty 2
  Filled 2025-01-14: qty 1
  Filled 2025-01-14: qty 2

## 2025-01-14 MED ORDER — POTASSIUM CHLORIDE 10 MEQ/100ML IV SOLN
10.0000 meq | INTRAVENOUS | Status: AC
  Administered 2025-01-14: 10 meq via INTRAVENOUS
  Filled 2025-01-14 (×3): qty 100

## 2025-01-14 MED ORDER — APIXABAN 2.5 MG PO TABS
2.5000 mg | ORAL_TABLET | Freq: Two times a day (BID) | ORAL | Status: AC
  Administered 2025-01-14 – 2025-01-16 (×6): 2.5 mg via ORAL
  Filled 2025-01-14 (×6): qty 1

## 2025-01-14 MED ORDER — ACETAMINOPHEN 650 MG RE SUPP: 650.0000 mg | Freq: Four times a day (QID) | RECTAL | Status: AC | PRN

## 2025-01-14 MED ORDER — METHYLPREDNISOLONE SODIUM SUCC 40 MG IJ SOLR
40.0000 mg | INTRAMUSCULAR | Status: DC
  Administered 2025-01-15: 40 mg via INTRAVENOUS
  Filled 2025-01-14: qty 1

## 2025-01-14 MED ORDER — SODIUM CHLORIDE 0.9% FLUSH
3.0000 mL | Freq: Two times a day (BID) | INTRAVENOUS | Status: AC
  Administered 2025-01-14 – 2025-01-16 (×6): 3 mL via INTRAVENOUS

## 2025-01-14 MED ORDER — SODIUM CHLORIDE 0.9 % IV SOLN
1.0000 g | INTRAVENOUS | Status: DC
  Administered 2025-01-14: 1 g via INTRAVENOUS
  Filled 2025-01-14 (×2): qty 10

## 2025-01-14 MED ORDER — POTASSIUM CHLORIDE 10 MEQ/100ML IV SOLN
10.0000 meq | INTRAVENOUS | Status: AC
  Administered 2025-01-14 (×2): 10 meq via INTRAVENOUS
  Filled 2025-01-14 (×2): qty 100

## 2025-01-14 MED ORDER — POTASSIUM CHLORIDE CRYS ER 20 MEQ PO TBCR
40.0000 meq | EXTENDED_RELEASE_TABLET | ORAL | Status: AC
  Administered 2025-01-14: 40 meq via ORAL
  Filled 2025-01-14 (×2): qty 2

## 2025-01-14 MED ORDER — DOBUTAMINE-DEXTROSE 4-5 MG/ML-% IV SOLN
2.5000 ug/kg/min | INTRAVENOUS | Status: DC
  Administered 2025-01-14: 2.5 ug/kg/min via INTRAVENOUS
  Filled 2025-01-14: qty 250

## 2025-01-14 MED ORDER — ALBUTEROL SULFATE (2.5 MG/3ML) 0.083% IN NEBU
2.5000 mg | INHALATION_SOLUTION | RESPIRATORY_TRACT | Status: DC | PRN
  Filled 2025-01-14: qty 3

## 2025-01-14 MED ORDER — FUROSEMIDE 10 MG/ML IJ SOLN
80.0000 mg | Freq: Two times a day (BID) | INTRAMUSCULAR | Status: DC
  Administered 2025-01-14 – 2025-01-15 (×3): 80 mg via INTRAVENOUS
  Filled 2025-01-14 (×3): qty 8

## 2025-01-14 MED ORDER — ENSURE PLUS HIGH PROTEIN PO LIQD
1.0000 | Freq: Four times a day (QID) | ORAL | Status: AC
  Administered 2025-01-14 – 2025-01-16 (×5): 237 mL via ORAL
  Filled 2025-01-14: qty 237

## 2025-01-14 MED ORDER — IPRATROPIUM-ALBUTEROL 0.5-2.5 (3) MG/3ML IN SOLN
3.0000 mL | Freq: Four times a day (QID) | RESPIRATORY_TRACT | Status: AC
  Administered 2025-01-14 – 2025-01-16 (×10): 3 mL via RESPIRATORY_TRACT
  Filled 2025-01-14 (×11): qty 3

## 2025-01-14 MED ORDER — SODIUM CHLORIDE 0.9% FLUSH: 10.0000 mL | INTRAVENOUS | Status: AC | PRN

## 2025-01-14 MED ORDER — CHLORHEXIDINE GLUCONATE CLOTH 2 % EX PADS
6.0000 | MEDICATED_PAD | Freq: Every day | CUTANEOUS | Status: AC
  Administered 2025-01-15 – 2025-01-16 (×2): 6 via TOPICAL

## 2025-01-14 MED ORDER — ENOXAPARIN SODIUM 300 MG/3ML IJ SOLN: 20.0000 mg | INTRAMUSCULAR | Status: DC

## 2025-01-14 MED ORDER — INSULIN ASPART 100 UNIT/ML IJ SOLN
0.0000 [IU] | Freq: Every day | INTRAMUSCULAR | Status: AC
  Administered 2025-01-14: 2 [IU] via SUBCUTANEOUS
  Administered 2025-01-15: 4 [IU] via SUBCUTANEOUS
  Filled 2025-01-14: qty 4
  Filled 2025-01-14: qty 3

## 2025-01-14 MED ORDER — ENOXAPARIN SODIUM 30 MG/0.3ML IJ SOSY: 30.0000 mg | PREFILLED_SYRINGE | INTRAMUSCULAR | Status: DC

## 2025-01-14 MED ORDER — ACETAMINOPHEN 325 MG PO TABS
650.0000 mg | ORAL_TABLET | Freq: Four times a day (QID) | ORAL | Status: AC | PRN
  Administered 2025-01-15: 650 mg via ORAL
  Filled 2025-01-14: qty 2

## 2025-01-14 MED ORDER — SPIRONOLACTONE 25 MG PO TABS
25.0000 mg | ORAL_TABLET | Freq: Every day | ORAL | Status: AC
  Administered 2025-01-15 – 2025-01-16 (×2): 25 mg via ORAL
  Filled 2025-01-14 (×2): qty 1
  Filled 2025-01-14: qty 2

## 2025-01-14 NOTE — ED Triage Notes (Signed)
 Pt presents via EMS c/o SOB x2 days. EMS reports pt on nasal canula at 2L at baseline. Given 15mg  Albuterol , 1mg  Atrovent , 2g magnesium , 125mg  Soulmedrol in route. Placed on CPAP in route.

## 2025-01-14 NOTE — ED Notes (Signed)
 Pt complains potassium IV is burning, rate reduced to 33mL/hr and added 25mL/hr carrier fluid

## 2025-01-14 NOTE — Progress Notes (Signed)
 Heart Failure Navigator Progress Note  Assessed for Heart & Vascular TOC clinic readiness.  Patient does not meet criteria due to she is an Advanced Heart Failure Team patient of Dr. Bensimhon. .   Navigator will sign off at this time.   Stephane Haddock, BSN, Scientist, Clinical (histocompatibility And Immunogenetics) Only

## 2025-01-14 NOTE — Consult Note (Addendum)
 "   Advanced Heart Failure Team Consult Note   Primary Physician: Tanda Bleacher, MD Cardiologist:  Dr. Cherrie  HPI:    Brittany [name]  is seen today for evaluation of acute on chronic systolic heart failure w/ low output, at the request of Dr. Griselda, Emergency medicine.   Brittany [name]  is a 64 y.o. female with end-stage systolic heart failure HF, PAF, CAD s/p emergent CABG in 2019 and DES in 2020, R renal infarct in 2022,  previous smoker quit in 2015, hypertension, previous small cell lung cancer treated with chemo, chest XRT and prophylactic brain radiation in 2015.    Admitted 01/2018 with NSTEMI and shock. Underwent emergent cath 02/03/18 showed LAD 100% stenosed, LCx 95% stenosed. Taken for emergent CABG 02/03/18. Required impella post op. Hospital course complicated by cardiogenic shock, HCAP, A fib, respiratory failure, and swallowing issues. She was discharged to SNF.    In 2019 had multiple hospitalizations for HF and pleural effusion. Underwent pleurodesis at [facility].   Admitted 3/20 with NSTEMI and HF. Received DES to ostial ramus into distal left main based on cath below and underwent diuresis. Meds adjusted as tolerated. Echo with EF 25-30%.    Over past 2 years multiple hospitalizations for ADHF and COPD flares. Most recent admission was earlier this month. Has been on inotropes multiple times. Has been DNR but refused Hospice eval.    Most recent echo 1/26 EF < 20 %, RV mildly reduced.   She now presents back to the ED via EMS w/ a/c hypoxic respiratory failure in the setting of low output HF and probable COPDE. NYHA Class IV, requiring BiPAP. Initial LA 2.2. B/l LEs mottled. WBC 13K, Na 134, K 2.9, Scr 1.3>>1.5. BP currently normotensive.   She remains DNR but still resistant to hospice/palliative care conversations.    Home Medications Prior to Admission medications  Medication Sig Start Date End Date Taking? Authorizing Provider  albuterol  (VENTOLIN  HFA)  108 (90 Base) MCG/ACT inhaler Inhale 2 puffs into the lungs every 6 (six) hours as needed for wheezing or shortness of breath. 06/20/22   Fairy Frames, MD  atorvastatin  (LIPITOR ) 80 MG tablet Take 1 tablet (80 mg total) by mouth daily. 10/22/24 01/20/25  Glena Harlene HERO, FNP  Blood Glucose Monitoring Suppl (BLOOD GLUCOSE MONITOR SYSTEM) w/Device KIT Use up to four times daily as directed. 12/31/24   Fairy Frames, MD  Blood Pressure Monitoring (COMFORT TOUCH BP CUFF/MEDIUM) MISC 1 each by Does not apply route daily at 12 noon. 12/31/24   Fairy Frames, MD  Blood Pressure Monitoring (OMRON 3 SERIES BP MONITOR) DEVI Use as directed. 12/31/24   Fairy Frames, MD  dapagliflozin  propanediol (FARXIGA ) 10 MG TABS tablet Take 1 tablet (10 mg total) by mouth daily. 12/31/24   Fairy Frames, MD  Digoxin  62.5 MCG TABS Take 0.0625 mg by mouth daily. 11/17/24   Colletta Manuelita Garre, PA-C  Dihydroxyaluminum Sod Carb (ROLAIDS PO) Take 1 tablet by mouth as needed.    [provider]  ELIQUIS  2.5 MG TABS tablet TAKE 1 TABLET BY MOUTH TWICE A DAY 11/11/24   [name], Harlene HERO, FNP  Furosemide  (FUROSCIX ) 80 MG/10ML CTKT Inject 80 mg into the skin daily as needed (fluid and edema). Take as directed by the heart failure clinic only 01/08/25   Bensimhon, Toribio SAUNDERS, MD  glucose blood (ACCU-CHEK GUIDE TEST) test strip Use as directed. 12/31/24   Fairy Frames, MD  hydrOXYzine  (VISTARIL ) 25 MG capsule TAKE 1 CAPSULE (25 MG TOTAL) BY MOUTH  DAILY AS NEEDED. 07/30/23   Jaycee Greig PARAS, NP  ipratropium-albuterol  (DUONEB) 0.5-2.5 (3) MG/3ML SOLN TAKE 3 MLS BY NEBULIZATION EVERY 4 (FOUR) HOURS AS NEEDED (FOR SHORTNESS OF BREATH). 10/03/24   Tanda Bleacher, MD  ivabradine  (CORLANOR ) 7.5 MG TABS tablet Take 1 tablet (7.5 mg total) by mouth 2 (two) times daily with a meal. 10/22/24   [name], Harlene HERO, FNP  Lancets [facility] DELICA PLUS [place]) MISC Use up to 4 (four) times daily as directed. 12/31/24   Fairy Frames,  MD  metolazone  (ZAROXOLYN ) 5 MG tablet Take 1 tablet (5 mg total) by mouth as needed (as instructed by Advanced HF clinic). Patient not taking: Reported on 12/26/2024 10/22/24   Glena Harlene HERO, FNP  mirtazapine  (REMERON ) 15 MG tablet Take 1 tablet (15 mg total) by mouth at bedtime. 12/31/24   Fairy Frames, MD  Multiple Vitamin (MULTIVITAMIN WITH MINERALS) TABS tablet Take 1 tablet by mouth daily. Gummies    [provider]  nitroGLYCERIN  (NITROSTAT ) 0.4 MG SL tablet Place 1 tablet (0.4 mg total) under the tongue every 5 (five) minutes as needed for chest pain. Patient not taking: Reported on 12/26/2024 03/13/24   Lee, Jordan, NP  Nutritional Supplements (ENSURE ORIGINAL) LIQD Take 1 Bottle by mouth in the morning, at noon, in the evening, and at bedtime.    [provider]  pantoprazole  (PROTONIX ) 40 MG tablet Take 1 tablet (40 mg total) by mouth daily at 6 (six) AM. 07/21/24   Cherlyn Labella, MD  potassium chloride  (KLOR-CON ) 20 MEQ packet Take 40 mEq by mouth 2 (two) times daily. 12/31/24   Fairy Frames, MD  spironolactone  (ALDACTONE ) 25 MG tablet Take 1 tablet (25 mg total) by mouth daily. 10/22/24   [name], Harlene HERO, FNP  torsemide  (DEMADEX ) 100 MG tablet Take 1 tablet (100 mg total) by mouth 2 (two) times daily. 10/22/24   Glena Harlene HERO, FNP    Past Medical History: Past Medical History:  Diagnosis Date   Acute respiratory failure (HCC) 05/05/2018   Acute systolic congestive heart failure (HCC) 02/03/2018   AICD (automatic cardioverter/defibrillator) present    Allergy    Anxiety    Asthma    DM2 (diabetes mellitus, type 2) (HCC) 10/20/2020   Hypertension    PAF (paroxysmal atrial fibrillation) (HCC)    Presence of permanent cardiac pacemaker    Prophylactic measure 08/03/14-08/19/14   Prophyl. 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   LIMA to LAD, SVG to D1, SVG to OM1, EVH via right thigh with implantation of Impella LD LVAD via direct  aortic approach   Small cell lung cancer (HCC) 03/16/2014    Past Surgical History: Past Surgical History:  Procedure Laterality Date   BRONCHIAL BRUSHINGS  10/25/2020   Procedure: BRONCHIAL BRUSHINGS;  Surgeon: Shelah Lamar RAMAN, MD;  Location: [facility] ENDOSCOPY;  Service: Pulmonary;;   BRONCHIAL NEEDLE ASPIRATION BIOPSY  10/25/2020   Procedure: BRONCHIAL NEEDLE ASPIRATION BIOPSIES;  Surgeon: Shelah Lamar RAMAN, MD;  Location: [facility] ENDOSCOPY;  Service: Pulmonary;;   CARDIAC DEFIBRILLATOR PLACEMENT  08/15/2018   MDT Visia AF MRI VR ICD implanted by Dr Zulma for primary prevention of sudden   CESAREAN SECTION     CORONARY ARTERY BYPASS GRAFT N/A 02/03/2018   Procedure: CORONARY ARTERY BYPASS GRAFTING (CABG);  Surgeon: Dusty Sudie DEL, MD;  Location: [facility] OR;  Service: Open Heart Surgery;  Laterality: N/A;  Time 3 using left internal mammary artery and endoscopically harvested right saphenous vein  CORONARY BALLOON ANGIOPLASTY N/A 02/03/2018   Procedure: CORONARY BALLOON ANGIOPLASTY;  Surgeon: Jordan, Peter M, MD;  Location: [facility] INVASIVE CV LAB;  Service: Cardiovascular;  Laterality: N/A;   CORONARY STENT INTERVENTION N/A 02/12/2019   Procedure: CORONARY STENT INTERVENTION;  Surgeon: Darron Deatrice LABOR, MD;  Location: [facility] INVASIVE CV LAB;  Service: Cardiovascular;  Laterality: N/A;   CORONARY/GRAFT ACUTE MI REVASCULARIZATION N/A 02/03/2018   Procedure: Coronary/Graft Acute MI Revascularization;  Surgeon: Jordan, Peter M, MD;  [facility] INVASIVE CV LAB;  Service: Cardiovascular;  Laterality: N/A;   ENDOBRONCHIAL ULTRASOUND N/A 10/25/2020   Procedure: ENDOBRONCHIAL ULTRASOUND;  Surgeon: Shelah Lamar RAMAN, MD;  Location: [facility] ENDOSCOPY;  Service: Pulmonary;  Laterality: N/A;   FLEXIBLE BRONCHOSCOPY  10/25/2020   Procedure: FLEXIBLE BRONCHOSCOPY;  Surgeon: Shelah Lamar RAMAN, MD;  Location: [facility] ENDOSCOPY;  Service: Pulmonary;;   IABP INSERTION N/A 02/03/2018   Procedure: IABP Insertion;  Surgeon: Jordan, Peter M, MD;   Location: [facility] INVASIVE CV LAB;  Service: Cardiovascular;  Laterality: N/A;   INTRAOPERATIVE TRANSESOPHAGEAL ECHOCARDIOGRAM N/A 02/03/2018   Procedure: INTRAOPERATIVE TRANSESOPHAGEAL ECHOCARDIOGRAM;  Surgeon: Dusty Sudie DEL, MD;  Location: [facility] OR;  Service: Open Heart Surgery;  Laterality: N/A;   LEFT HEART CATH AND CORONARY ANGIOGRAPHY N/A 02/03/2018   Procedure: LEFT HEART CATH AND CORONARY ANGIOGRAPHY;  Surgeon: Jordan, Peter M, MD;  Location: [facility] INVASIVE CV LAB;  Service: Cardiovascular;  Laterality: N/A;   LEFT HEART CATH AND CORS/GRAFTS ANGIOGRAPHY N/A 02/12/2019   Procedure: LEFT HEART CATH AND CORS/GRAFTS ANGIOGRAPHY;  Surgeon: Darron Deatrice LABOR, MD;  Location: [facility] INVASIVE CV LAB;  Service: Cardiovascular;  Laterality: N/A;   MEDIASTINOSCOPY N/A 03/11/2014   Procedure: MEDIASTINOSCOPY;  Surgeon: Elspeth JAYSON Millers, MD;  Location: [facility] OR;  Service: Thoracic;  Laterality: N/A;   PLACEMENT OF IMPELLA LEFT VENTRICULAR ASSIST DEVICE  02/03/2018   Procedure: PLACEMENT OF IMPELLA LEFT VENTRICULAR ASSIST DEVICE LD;  Surgeon: Dusty Sudie DEL, MD;  Location: [facility] OR;  Service: Open Heart Surgery;;   REMOVAL OF IMPELLA LEFT VENTRICULAR ASSIST DEVICE N/A 02/08/2018   Procedure: REMOVAL OF IMPELLA LEFT VENTRICULAR ASSIST DEVICE;  Surgeon: Dusty Sudie DEL, MD;  Location: [facility] OR;  Service: Open Heart Surgery;  Laterality: N/A;   RIGHT HEART CATH N/A 02/03/2018   Procedure: RIGHT HEART CATH;  Surgeon: Jordan, Peter M, MD;  Location: [facility] INVASIVE CV LAB;  Service: Cardiovascular;  Laterality: N/A;   RIGHT HEART CATH N/A 05/09/2018   Procedure: RIGHT HEART CATH;  Surgeon: Cherrie Toribio SAUNDERS, MD;  Location: [facility] INVASIVE CV LAB;  Service: Cardiovascular;  Laterality: N/A;   TEE WITHOUT CARDIOVERSION N/A 02/08/2018   Procedure: TRANSESOPHAGEAL ECHOCARDIOGRAM (TEE);  Surgeon: Dusty Sudie DEL, MD;  Location: [facility] OR;  Service: Open Heart Surgery;  Laterality: N/A;   TUBAL LIGATION     VIDEO BRONCHOSCOPY WITH ENDOBRONCHIAL  ULTRASOUND N/A 03/11/2014   Procedure: VIDEO BRONCHOSCOPY WITH ENDOBRONCHIAL ULTRASOUND;  Surgeon: Elspeth JAYSON Millers, MD;  Location: [facility] OR;  Service: Thoracic;  Laterality: N/A;    Family History: Family History  Problem Relation Age of Onset   Heart disease Mother    Hypertension Mother    Heart attack Mother    Hypertension Maternal Grandmother    Cancer Maternal Grandmother    Diabetes Paternal Grandmother    Stroke Neg Hx     Social History: Social History   Socioeconomic History   Marital status: Married    Spouse name: Keven Washington     Number of children: 1   Years  of education: Not on file   Highest education level: Not on file  Occupational History   Occupation: disabled  Tobacco Use   Smoking status: Former    Current packs/day: 0.00    Average packs/day: 1 pack/day for 20.0 years (20.0 ttl pk-yrs)    Types: Cigarettes    Start date: 03/13/1994    Quit date: 03/13/2014    Years since quitting: 10.8   Smokeless tobacco: Never  Vaping Use   Vaping status: Never Used  Substance and Sexual Activity   Alcohol use: Yes    Alcohol/week: 5.0 standard drinks of alcohol    Types: 5 Glasses of wine per week    Comment: a few glasses of wine weekly   Drug use: Yes    Types: Marijuana    Comment: medicinal, not daily   Sexual activity: Never  Other Topics Concern   Not on file  Social History Narrative   Not on file   Social Drivers of Health   Tobacco Use: Medium Risk (01/14/2025)   Patient History    Smoking Tobacco Use: Former    Smokeless Tobacco Use: Never    Passive Exposure: Not on Actuary Strain: Not on file  Food Insecurity: No Food Insecurity (12/29/2024)   Epic    Worried About Programme Researcher, Broadcasting/film/video in the Last Year: Never true    Ran Out of Food in the Last Year: Never true  Transportation Needs: No Transportation Needs (12/29/2024)   Epic    Lack of Transportation (Medical): No    Lack of Transportation (Non-Medical): No   Recent Concern: Transportation Needs - Unmet Transportation Needs (11/17/2024)   Epic    Lack of Transportation (Medical): Yes    Lack of Transportation (Non-Medical): No  Physical Activity: Insufficiently Active (08/14/2024)   Exercise Vital Sign    Days of Exercise per Week: 2 days    Minutes of Exercise per Session: 20 min  Stress: Not on file  Social Connections: Unknown (12/29/2024)   Social Connection and Isolation Panel    Frequency of Communication with Friends and Family: Not on file    Frequency of Social Gatherings with Friends and Family: Not on file    Attends Religious Services: Not on file    Active Member of Clubs or Organizations: Not on file    Attends Banker Meetings: Not on file    Marital Status: Married  Depression (PHQ2-9): Low Risk (07/22/2024)   Depression (PHQ2-9)    PHQ-2 Score: 0  Alcohol Screen: Low Risk (08/14/2024)   Alcohol Screen    Last Alcohol Screening Score (AUDIT): 3  Housing: Low Risk (12/29/2024)   Epic    Unable to Pay for Housing in the Last Year: No    Number of Times Moved in the Last Year: 0    Homeless in the Last Year: No  Utilities: Not At Risk (12/29/2024)   Epic    Threatened with loss of utilities: No  Health Literacy: Not on file    Allergies:  Allergies[1]  Objective:   Vital Signs:   Temp:  [97.6 F (36.4 C)] 97.6 F (36.4 C) (02/04 0722) Pulse Rate:  [107-108] 108 (02/04 0648) Resp:  [21-24] 24 (02/04 0648) BP: (128)/(86) 128/86 (02/04 0647) SpO2:  [100 %] 100 % (02/04 0648) FiO2 (%):  [40 %] 40 % (02/04 0648) Weight:  [36.3 kg] 36.3 kg (02/04 0700)    Weight change: Filed Weights   01/14/25 0700  Weight:  36.3 kg    Intake/Output:  No intake or output data in the 24 hours ending 01/14/25 0724   Physical Exam  General:  chronically ill, cachetic appearing female on BiPAP.  Cor: Regular rate & rhythm. No murmurs. JVD moderately elevated  Lungs: course/rhonchrous bilaterally, bibasilar crackles   Extremities: cool distal ext bilaterally, bilateral knees mottled. No LEE   Telemetry   Sinus tach 110s, personally reviewed   EKG    Sinus tach 105 bpm, barostim artifact   Labs   Basic Metabolic Panel: Recent Labs  Lab 01/14/25 0705  NA 134*  132*  K 2.9*  2.9*  CL 91*  GLUCOSE 339*  BUN 46*  CREATININE 1.50*    Liver Function Tests: No results for input(s): AST, ALT, ALKPHOS, BILITOT, PROT, ALBUMIN  in the last 168 hours. No results for input(s): LIPASE, AMYLASE in the last 168 hours. No results for input(s): AMMONIA in the last 168 hours.  CBC: Recent Labs  Lab 01/14/25 0655 01/14/25 0705  WBC 13.4*  --   NEUTROABS 10.2*  --   HGB 15.4* 17.3*  17.0*  HCT 47.0* 51.0*  50.0*  MCV 89.5  --   PLT 270  --     Cardiac Enzymes: No results for input(s): CKTOTAL, CKMB, CKMBINDEX, TROPONINI in the last 168 hours.  BNP: BNP (last 3 results) Recent Labs    09/16/24 1115 10/22/24 1526 11/17/24 1618  BNP 1,596.2* 1,589.7* 1,898.1*    ProBNP (last 3 results) Recent Labs    12/26/24 2003  PROBNP 12,902.0*     CBG: No results for input(s): GLUCAP in the last 168 hours.  Coagulation Studies: Recent Labs    01/14/25 0655  LABPROT 15.0  INR 1.1     Imaging   DG Chest Port 1 View Result Date: 01/14/2025 EXAM: 1 VIEW XRAY OF THE CHEST 01/14/2025 07:01:14 AM COMPARISON: 12/27/2024 CLINICAL HISTORY: 64 year old female. Shortness of breath for 2 days, history of treated lung cancer. FINDINGS: LINES, TUBES AND DEVICES: Neurostimulator in right chest. Left chest pacemaker/AICD with leads in right ventricle. LUNGS AND PLEURA: Stable large lung volumes. Coarse right hilar streaky opacity appears stable and by CT appeared to be post radiation lung changes. No pleural effusion. No pneumothorax. HEART AND MEDIASTINUM: Unchanged cardiomegaly. Aortic calcification. Cardiac surgical changes in mediastinum. BONES AND SOFT TISSUES: Intact  sternotomy wires. No acute osseous abnormality. IMPRESSION: 1. No acute cardiopulmonary abnormality. Stable chronic and post treatment changes in the chest. Electronically signed by: Helayne Hurst MD 01/14/2025 07:10 AM EST RP Workstation: [id number]    Medications:   Current Medications:   Infusions:  DOBUTamine      potassium chloride      Patient Profile   64 y.o. female with end-stage systolic heart failure HF w/ numerous admissions over the last year for CHF and COPD requiring inotropic support. Also  PAF, CAD s/p emergent CABG in 2019 and DES in 2020, R renal infarct in 2022,  previous smoker quit in 2015, hypertension, previous small cell lung cancer treated with chemo, chest XRT and prophylactic brain radiation, presenting w/ a/c hypoxic respiratory failure and a/c NYHA Class IV CHF w/ low output.    Assessment/Plan   1. Acute on Chronic Systolic Heart Failure, end-stage - HF felt to be end-stage. EF has been 20-25% for the last 5 years.  - Last Echo 1/26 EF < 20%, RV mildly reduced  - S/p Medtronic ICD and Barostim - Numerous hospitalizations over last 2 years  - Now readmitted w/ NYHA  IV symptoms and low output, Lactic acid 2.2  - Start DBA 2.5 mcg/kg/min - Diurese w/ IV Lasix  80 mg bid + aggressive K supp  - continue Farxiga  10 mg daily  - continue spironolactone  25 mg daily  - continue ivabradine  7.5 mg bid - continue digoxin  0.0625 mg daily. Dig level pending  - she is not a candidate for advanced therapies. She is DNR but still refusing palliative/hospice care   2. Acute on Chronic Hypoxic Respiratory Failure - multifactorial 2/2 a/c CHF and ACOPDE, currently on BiPAP  - diuresis per above - continue O2 - COPDE per IM    3. Hypokalemia - K 2.9 - supp w/ IV K runs - monitor closely w/ diuresis  - check Mg level      4. CKD Stage IIIb  - Creatinine baseline ~ 1.4-1.7 - Creatinine 1.3>>1.5  - Cardiorenal  - monitor w/ diuresis    5. PAF  - In NSR - Continue  Eliquis  2.5 mg bid. Reduced dose (weight/creatinine).   6. CAD - History of CABG x 2 2019 - s/p DES ostial ramus extending to left main (2020) - denies CP  - Continue atorvastatin  80 mg daily - No ASA with need for AC   6. H/o SCLC - Completed treatment 2015.  - CT chest (5/24) soft tissue thickening along mediastinum but no discrete mass    7. H/o Renal Infarct 2022 - Continue Eliquis . No bleeding    Length of Stay: 0  Brittainy Marcine, PA-C  01/14/2025, 7:24 AM  Advanced Heart Failure Team Pager [phone] (M-F; 7a - 5p)   Please visit [email or web address]: For overnight coverage please call cardiology fellow first. If fellow not available call Shock/ECMO MD on call.  For ECMO / Mechanical Support (Impella, IABP, LVAD) issues call Shock / ECMO MD on call.   Patient seen with PA, I formulated the plan and agree with the above note.   History as noted above.  Multiple admissions in setting of end stage CHF and COPD.  She reports several days of worsening dyspnea and cough. She has been out of Furoscix  at home and also has been out of her inhalers.  In ER, she initially required Bipap (now on 2L [place]).  Lactate 2.2, pro-BNP 13,411, TnT 71.    General: Frail Neck: JVP 12 cm, no thyromegaly or thyroid  nodule.  Lungs: Diffuse rhonchi bilaterally. CV: Nondisplaced PMI.  Heart regular S1/S2, no S3/S4, no murmur.  No peripheral edema.  No carotid bruit.  Difficult to palpate pedal pulses.  Abdomen: Soft, nontender, no hepatosplenomegaly, no distention.  Skin: Intact without lesions or rashes.  Neurologic: Alert and oriented x 3.  Psych: Normal affect. Extremities: No clubbing or cyanosis.  HEENT: Normal.   Acute on chronic systolic CHF, low output with elevated lactate.  She is volume overloaded on exam.  History of end stage cardiomyopathy and not candidate for advanced therapies, she has not been ready for hospice transition though she is DNR. Creatinine is near her baseline at 1.3 - I will  place PICC to follow CVP and co-ox.  - Start dobutamine  2.5 mcg/kg/min.  - Lasix  80 mg IV bid. - Continue Farxiga , spironolactone , ivabradine , digoxin .  Check digoxin  level.   Lungs markedly rhonchorous, CXR without PNA.  I worry that she has a co-existing COPD exacerbation.   - Ceftriaxone  - Solumedrol 40 mg IV daily - Nebs.   Poor long-term prognosis, low output HF and very frail.   Ezra Shuck 01/14/2025 11:16 AM     [  1]  Allergies Allergen Reactions   Sulfa Antibiotics Hives    Patient requested to have this undeleted, had this reaction about 5 years ago.   Lactose Intolerance (Gi) Diarrhea   Codeine Nausea And Vomiting   "

## 2025-01-14 NOTE — Progress Notes (Incomplete)
 "   Advanced [facility] Failure Clinic   PCP:  Tanda Bleacher, MD  Oncology: Dr. Sherrod  HF Cardiologist: Dr Cherrie  HPI: Brittany [name]  is a 64 y.o. 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 Underwent emergent cath 02/03/18 showed LAD 100% stenosed, LCx 95% stenosed. Taken for emergent CABG 02/03/18. Required impella post op. Hospital course complicated by cardiogenic shock, HCAP, A fib, respiratory failure, and swallowing issues. She was discharged to SNF. Discharge weight 103 pounds.    In 2019 had multiple hospitalizations for HF and pleural effusion. Underwent pleurodesis at [facility].   Admitted 3/20 with NSTEMI and HF. Received DES to ostial ramus into distal left main based on cath below and underwent diuresis. Meds adjusted as tolerated. Echo with EF 25-30%.    Admitted twice in 2023 with low output requiring milrinone . Barostim placed.   Multiple CHF and AECOPD admission in May, August, October of 2024 and Janurary 2025. Echo 1/25 EF  < 20%, G2DD, RV moderately to severely reduced. She remained DNR.  Admitted 3/25 for CHF and AECOPD. Readmitted 4/25 for CHF/COPD. Was hypotensive. Moved to ICU. Required DBA and IV diuresis. Refused [facility] services.   Admitted 8/25 with a/c hypoxic respiratory failure and acute on chronic CHF. She required inotrope support with DBA which was wean off prior to discharge.   Remote ICD interrogation 08/18 indicated fluid accumulation since 08/12. We attempted to arrange for Furoscix  08/19 but she was unable to get to clinic to pick up the kit. She did use 5 mg metolazone .  Admitted 9/25 with a/c HF. Diuresed with IV lasix  and transiently required BiPap. GDMT titrated  but limited by AKI. She was discharged home, weight 86 lbs.  Volume overloaded at follow-up a few weeks ago. Instructed to use furoscix  BID X 2 days. Restarted Farxiga . Here today for close CHF follow-up.   Here today for CHF follow-up. Last used furoscix  a week ago, only has one kit left. Some of the ones in her last shipment were defective. Gets winded easily with exertion but does not feel worse than baseline. Does not feel volume up today. Has been slowly losing weight, down to 84 lb. Doesn't have much appetite. No lower extremity edema. Taking meds as prescribed, but did run out of digoxin  2 days ago.   Past Medical History:  Diagnosis Date   Acute respiratory failure (HCC) 05/05/2018   Acute systolic congestive heart failure (HCC) 02/03/2018   AICD (automatic cardioverter/defibrillator) present    Allergy    Anxiety    Asthma    DM2 (diabetes mellitus, type 2) (HCC) 10/20/2020   Hypertension    PAF (paroxysmal atrial fibrillation) (HCC)    Presence of permanent cardiac pacemaker    Prophylactic measure 08/03/14-08/19/14   Prophyl. 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   LIMA to LAD, SVG to D1, SVG to OM1, EVH via right thigh with implantation of Impella LD LVAD via direct aortic approach   Small cell lung cancer (HCC) 03/16/2014   Past Surgical History:  Procedure Laterality Date   BRONCHIAL BRUSHINGS  10/25/2020   Procedure: BRONCHIAL BRUSHINGS;  Surgeon: Shelah Lamar RAMAN, MD;  Location: [facility] ENDOSCOPY;  Service:  Pulmonary;;   BRONCHIAL NEEDLE ASPIRATION BIOPSY  10/25/2020   Procedure: BRONCHIAL NEEDLE ASPIRATION BIOPSIES;  Surgeon: Shelah Lamar RAMAN, MD;  Location: [facility] ENDOSCOPY;  Service: Pulmonary;;   CARDIAC DEFIBRILLATOR PLACEMENT  08/15/2018   MDT Visia AF MRI VR ICD implanted by Dr Zulma for primary prevention of sudden   CESAREAN SECTION     CORONARY ARTERY BYPASS GRAFT N/A 02/03/2018   Procedure: CORONARY ARTERY BYPASS GRAFTING (CABG);  Surgeon: Dusty Sudie DEL, MD;  Location: [facility] OR;  Service: Open Heart Surgery;  Laterality: N/A;  Time 3 using left internal mammary artery and endoscopically harvested right saphenous vein   CORONARY BALLOON ANGIOPLASTY N/A 02/03/2018   Procedure: CORONARY BALLOON ANGIOPLASTY;  Surgeon: Jordan, Peter M, MD;  Location: [facility] INVASIVE CV LAB;  Service: Cardiovascular;  Laterality: N/A;   CORONARY STENT INTERVENTION N/A 02/12/2019   Procedure: CORONARY STENT INTERVENTION;  Surgeon: Darron Deatrice LABOR, MD;  Location: [facility] INVASIVE CV LAB;  Service: Cardiovascular;  Laterality: N/A;   CORONARY/GRAFT ACUTE MI REVASCULARIZATION N/A 02/03/2018   Procedure: Coronary/Graft Acute MI Revascularization;  Surgeon: Jordan, Peter M, MD;  Location: [facility] INVASIVE CV LAB;  Service: Cardiovascular;  Laterality: N/A;   ENDOBRONCHIAL ULTRASOUND N/A 10/25/2020   Procedure: ENDOBRONCHIAL ULTRASOUND;  Surgeon: Shelah Lamar RAMAN, MD;  Location: [facility] ENDOSCOPY;  Service: Pulmonary;  Laterality: N/A;   FLEXIBLE BRONCHOSCOPY  10/25/2020   Procedure: FLEXIBLE BRONCHOSCOPY;  Surgeon: Shelah Lamar RAMAN, MD;  Location: [facility] ENDOSCOPY;  Service: Pulmonary;;   IABP INSERTION N/A 02/03/2018   Procedure: IABP Insertion;  Surgeon: Jordan, Peter M, MD;  Location: [facility] INVASIVE CV LAB;  Service: Cardiovascular;  Laterality: N/A;   INTRAOPERATIVE TRANSESOPHAGEAL ECHOCARDIOGRAM N/A 02/03/2018   Procedure: INTRAOPERATIVE TRANSESOPHAGEAL ECHOCARDIOGRAM;  Surgeon: Dusty Sudie DEL, MD;  Location: [facility] OR;  Service: Open Heart Surgery;  Laterality: N/A;   LEFT HEART CATH AND CORONARY ANGIOGRAPHY N/A 02/03/2018   Procedure: LEFT HEART CATH AND CORONARY ANGIOGRAPHY;  Surgeon: Jordan, Peter M, MD;  Location: [facility] INVASIVE CV LAB;  Service: Cardiovascular;  Laterality: N/A;   LEFT HEART CATH AND CORS/GRAFTS ANGIOGRAPHY N/A 02/12/2019   Procedure: LEFT HEART CATH AND CORS/GRAFTS ANGIOGRAPHY;  Surgeon: Darron Deatrice LABOR, MD;  Location: [facility] INVASIVE CV LAB;  Service: Cardiovascular;  Laterality:  N/A;   MEDIASTINOSCOPY N/A 03/11/2014   Procedure: MEDIASTINOSCOPY;  Surgeon: Elspeth JAYSON Millers, MD;  Location: [facility] OR;  Service: Thoracic;  Laterality: N/A;   PLACEMENT OF IMPELLA LEFT VENTRICULAR ASSIST DEVICE  02/03/2018   Procedure: PLACEMENT OF IMPELLA LEFT VENTRICULAR ASSIST DEVICE LD;  Surgeon: Dusty Sudie DEL, MD;  Location: [facility] OR;  Service: Open Heart Surgery;;   REMOVAL OF IMPELLA LEFT VENTRICULAR ASSIST DEVICE N/A 02/08/2018   Procedure: REMOVAL OF IMPELLA LEFT VENTRICULAR ASSIST DEVICE;  Surgeon: Dusty Sudie DEL, MD;  Location: [facility] OR;  Service: Open Heart Surgery;  Laterality: N/A;   RIGHT HEART CATH N/A 02/03/2018   Procedure: RIGHT HEART CATH;  Surgeon: Jordan, Peter M, MD;  Location: [facility] INVASIVE CV LAB;  Service: Cardiovascular;  Laterality: N/A;   RIGHT HEART CATH N/A 05/09/2018   Procedure: RIGHT HEART CATH;  Surgeon: Cherrie Toribio SAUNDERS, MD;  Location: [facility] INVASIVE CV LAB;  Service: Cardiovascular;  Laterality: N/A;   TEE WITHOUT CARDIOVERSION N/A 02/08/2018   Procedure: TRANSESOPHAGEAL ECHOCARDIOGRAM (TEE);  Surgeon: Dusty Sudie DEL, MD;  Location: [facility] OR;  Service: Open Heart Surgery;  Laterality: N/A;   TUBAL LIGATION     VIDEO BRONCHOSCOPY WITH ENDOBRONCHIAL ULTRASOUND N/A 03/11/2014  Procedure: VIDEO BRONCHOSCOPY WITH ENDOBRONCHIAL ULTRASOUND;  Surgeon: Elspeth JAYSON Millers, MD;  Location: [facility] OR;  Service: Thoracic;  Laterality: N/A;   No current facility-administered medications for this visit.   Current Outpatient Medications  Medication Sig Dispense Refill   albuterol  (VENTOLIN  HFA) 108 (90 Base) MCG/ACT inhaler Inhale 2 puffs into the lungs every 6 (six) hours as needed for wheezing or shortness of breath. 8.5 g 1   atorvastatin  (LIPITOR ) 80 MG tablet Take 1 tablet (80 mg total) by mouth daily. 90 tablet 3   dapagliflozin  propanediol (FARXIGA ) 10 MG TABS tablet Take 1 tablet (10 mg total) by mouth daily. 90 tablet 0   Digoxin  62.5 MCG TABS Take 0.0625 mg by mouth daily. 90  tablet 3   Dihydroxyaluminum Sod Carb (ROLAIDS PO) Take 1 tablet by mouth as needed.     ELIQUIS  2.5 MG TABS tablet TAKE 1 TABLET BY MOUTH TWICE A DAY 180 tablet 1   Furosemide  (FUROSCIX ) 80 MG/10ML CTKT Inject 80 mg into the skin daily as needed (fluid and edema). Take as directed by the heart failure clinic only 10 each 0   hydrOXYzine  (VISTARIL ) 25 MG capsule TAKE 1 CAPSULE (25 MG TOTAL) BY MOUTH DAILY AS NEEDED. 90 capsule 1   ipratropium-albuterol  (DUONEB) 0.5-2.5 (3) MG/3ML SOLN TAKE 3 MLS BY NEBULIZATION EVERY 4 (FOUR) HOURS AS NEEDED (FOR SHORTNESS OF BREATH). 540 mL 1   ivabradine  (CORLANOR ) 7.5 MG TABS tablet Take 1 tablet (7.5 mg total) by mouth 2 (two) times daily with a meal. 180 tablet 3   metolazone  (ZAROXOLYN ) 5 MG tablet Take 1 tablet (5 mg total) by mouth as needed (as instructed by Advanced HF clinic). (Patient not taking: Reported on 12/26/2024) 10 tablet 3   mirtazapine  (REMERON ) 15 MG tablet Take 1 tablet (15 mg total) by mouth at bedtime. 30 tablet 0   Multiple Vitamin (MULTIVITAMIN WITH MINERALS) TABS tablet Take 1 tablet by mouth daily. Gummies     nitroGLYCERIN  (NITROSTAT ) 0.4 MG SL tablet Place 1 tablet (0.4 mg total) under the tongue every 5 (five) minutes as needed for chest pain. (Patient not taking: Reported on 12/26/2024) 90 tablet 0   Nutritional Supplements (ENSURE ORIGINAL) LIQD Take 1 Bottle by mouth in the morning, at noon, in the evening, and at bedtime.     pantoprazole  (PROTONIX ) 40 MG tablet Take 1 tablet (40 mg total) by mouth daily at 6 (six) AM. 5 tablet 0   potassium chloride  (KLOR-CON ) 20 MEQ packet Take 40 mEq by mouth 2 (two) times daily.     spironolactone  (ALDACTONE ) 25 MG tablet Take 1 tablet (25 mg total) by mouth daily. 90 tablet 3   torsemide  (DEMADEX ) 100 MG tablet Take 1 tablet (100 mg total) by mouth 2 (two) times daily. 180 tablet 3   Facility-Administered Medications Ordered in Other Visits  Medication Dose Route Frequency Provider Last Rate  Last Admin   acetaminophen  (TYLENOL ) tablet 650 mg  650 mg Oral Q6H PRN Smith, Rondell A, MD       Or   acetaminophen  (TYLENOL ) suppository 650 mg  650 mg Rectal Q6H PRN Claudene Reeves A, MD       DOBUTamine  (DOBUTREX ) infusion 4000 mcg/mL  2.5 mcg/kg/min (Order-Specific) Intravenous Continuous Smith, Rondell A, MD       enoxaparin  (LOVENOX ) 100 mg/mL injection 20 mg  20 mg Subcutaneous Q24H Smith, Rondell A, MD       furosemide  (LASIX ) injection 80 mg  80 mg Intravenous BID Marcine Catalan  M, PA-C   80 mg at 01/14/25 9078   potassium chloride  10 mEq in 100 mL IVPB  10 mEq Intravenous Q1 Hr x 3 Schlossman, Rocky, MD   Paused at 01/14/25 0957   sodium chloride  flush (NS) 0.9 % injection 3 mL  3 mL Intravenous Q12H Smith, Rondell A, MD        Allergies:   Sulfa antibiotics, Lactose intolerance (gi), and Codeine   Social History:  The patient  reports that she quit smoking about 10 years ago. Her smoking use included cigarettes. She started smoking about 30 years ago. She has a 20 pack-year smoking history. She has never used smokeless tobacco. She reports current alcohol use of about 5.0 standard drinks of alcohol per week. She reports current drug use. Drug: Marijuana.   Family History:  The patient's family history includes Cancer in her maternal grandmother; Diabetes in her paternal grandmother; Heart attack in her mother; Heart disease in her mother; Hypertension in her maternal grandmother and mother.   ROS:  Please see the history of present illness.   All other systems are personally reviewed and negative.    Wt Readings from Last 3 Encounters:  01/14/25 36.3 kg (80 lb)  12/31/24 36.3 kg (80 lb 0.4 oz)  11/17/24 39.2 kg (86 lb 6.4 oz)   There were no vitals taken for this visit.  Physical Exam General:  Chronically ill appearing Cor: JVP 9-10. Regular rate & rhythm. No murmurs. Lungs: + crackles in bases Abdomen: soft, nontender, nondistended. Extremities: no edema Neuro: alert  & orientedx3. Affect pleasant   Device interrogation (personally reviewed): OptiVol persistently > 200 X 2 months, thoracic impedance below threshold, no VT/VF  Assessment & Plan: 1. End-stage Chronic Systolic Heart Failure - EF has been 20-25% for the last 5 years.  - S/p Medtronic ICD and Barostim - Echo 1/25: EF < 20%, mod to severely reduced RV (in setting of RSV) - Numerous hospitalizations over past 18 months  - NYHA III, functional status confounded by COPD. Volume mildly up on exam. ReDS only 17% in setting of COPD (may be falsely low). Suspect OptiVol falsely high. Message sent to device clinic to reset counts. - Will get refills on furoscix  kits. Use one kit tomorrow with 100 mg Torsemide  in afternoon, then continue 100 mg Torsemide  BID. Continue 80 KCL BID. - Has metolazone  to use PRN but she does not feel she responds very well to this - Refill digoxin  - Continue Farxiga  10 mg daily. - Continue spironolactone  25 mg daily. - Continue ivabradine  7.5 mg bid. - Not a candidate for advanced therapies due to severity of lung disease and frailty.  - Labs today   2. Chronic Hypoxic Respiratory Failure/COPD  - Hx frequent admissions for AE COPD + A/C HFrEF.  - Currently stable   3. CKD IIIb - Creatinine baseline ~ 1.4-1.7.  - Continue Farxiga  10 mg daily - Check labs  4. PAF  - Regular on exam. No episodes on device - Continue Eliquis  2.5 mg bid, reduced dose (weight/creatinine). No bleeding issues  5. CAD - History of CABG x 2 2019 - s/p DES ostial ramus extending to left main (2020) - No angina - No ASA with need for anticoagulation - Continue high-intensity statin  6. H/o SCLC - Completed treatment 2015.  - CT chest (5/24) soft tissue thickening along mediastinum but no discrete mass    7. H/o Renal Infarct 2022 - On Eliquis .  - No bleeding issues.  8. GOC/FTT - She is DNR/DNI - She has declined HH PT/OT  9. DM II - Last A1c was 6.5% - Per PCP  Follow  up 6 weeks with APP  Harlene HERO [facility] PA-C 01/14/25 "

## 2025-01-14 NOTE — Progress Notes (Signed)
 Peripherally Inserted Central Catheter Placement  The IV Nurse has discussed with the patient and/or persons authorized to consent for the patient, the purpose of this procedure and the potential benefits and risks involved with this procedure.  The benefits include less needle sticks, lab draws from the catheter, and the patient may be discharged home with the catheter. Risks include, but not limited to, infection, bleeding, blood clot (thrombus formation), and puncture of an artery; nerve damage and irregular heartbeat and possibility to perform a PICC exchange if needed/ordered by physician.  Alternatives to this procedure were also discussed.  Bard Power PICC patient education guide, fact sheet on infection prevention and patient information card has been provided to patient /or left at bedside.    PICC Placement Documentation  PICC Double Lumen 01/14/25 Right Brachial 32 cm 0 cm (Active)  Indication for Insertion or Continuance of Line Vasoactive infusions 01/14/25 1823  Exposed Catheter (cm) 0 cm 01/14/25 1823  Site Assessment Clean, Dry, Intact 01/14/25 1823  Lumen #1 Status Flushed;Saline locked;Blood return noted 01/14/25 1823  Lumen #2 Status Flushed;Saline locked;Blood return noted 01/14/25 1823  Dressing Type Transparent;Securing device 01/14/25 1823  Dressing Status Antimicrobial disc/dressing in place;Clean, Dry, Intact 01/14/25 1823  Line Care Connections checked and tightened 01/14/25 1823  Line Adjustment (NICU/IV Team Only) No 01/14/25 1823  Dressing Intervention New dressing;Adhesive placed at insertion site (IV team only) 01/14/25 1823  Dressing Change Due 01/21/25 01/14/25 1823       Jolee Na 01/14/2025, 6:24 PM

## 2025-01-14 NOTE — ED Notes (Addendum)
 Pt yelling at this nurse telling her she is ordering her around telling her to take medications she doesn't even take. This nurse educated patients on what medications she is receiving and what they are for. Pt refuses to take Lipitor  and Spiranolactone because she does not think she takes them at home. Pt tells this nurse that this nurse doesn't know what she is doing and this nurse needs to know what medicines she is taking. Pt keeps yelling about her heart medication Corlanor  Pt told by this nurse she will receive that when it is due at 5pm. Pt continually reminds this nurse I am taking a steroid so I am very irritable. This nurse continues to remind patient that this is not an excuse to yell at staff.

## 2025-01-14 NOTE — ED Notes (Signed)
 Pt wanted to take off Bipap, let RT know and was told to trial her on 2L . Pt is on 2L at this time

## 2025-01-14 NOTE — H&P (Signed)
 " History and Physical    Patient: Brittany [name]  FMW:[id number] DOB: 17-Dec-1960 DOA: 01/14/2025 DOS: the patient was seen and examined on 01/14/2025 PCP: Tanda Bleacher, MD  Patient coming from: Home via EMS  Chief Complaint:  Chief Complaint  Patient presents with   Shortness of Breath   HPI: Brittany [name]  is a 64 y.o. female with medical history significant of HFrEF last EF 20-25%, s/p ICD (on digoxin , Farxiga , spironolactone , ivabradine ), chronic hypoxic respiratory failure on 2 L, CAD s/p CABG x 2 and DES  in 2020, paroxysmal atrial fibrillation on Eliquis , CKD 3B, COPD, history of renal infarction 2022 on Eliquis , prior history of lung cancer s/p chemo, and non-insulin -dependent DM type II presents with difficulty breathing. She has been experiencing difficulty breathing that began suddenly yesterday. She has a history of congestive heart failure and COPD, which typically requires breathing treatments at home. However, she has been out of these treatments for about a week. She reports that she normally uses furosemide , which she administers via attachment to her stomach when she feels she has too much fluid. Despite being out of her breathing treatments, she denies any swelling and states she never experiences swelling. She has a cough and occasionally uses oxygen at home, typically at two liters, but only when needed. No recent fevers or chills, and she states she never gets fevers. She also feels nauseous. Her social history includes having a sister and a son who lives out of state in [place]. She is concerned about her son, who has been diagnosed with blood clots and requires hospitalization, which has been distressing for her.   En route with EMS patient had been given nebulized albuterol  15 mg, Atrovent  1 mg, magnesium  sulfate 2 g IV, Solu-Medrol  125 mg IV, and placed on CPAP.  Upon admission to the emergency department patient was noted to be afebrile with a pulse elevated up to  108, respirations 24, blood pressure 92/58 to 128/86, and O2 saturations currently maintained on BiPAP.  Labs significant for proBNP 13,411, high sensitive troponin 71, WBC 13.4, hemoglobin 15.4, sodium 134, potassium 3.1, BUN 45, creatinine 1.32, glucose 378, calcium  10.4, and anion gap 16.  Chest x-ray noted no acute abnormality with stable.    Review of Systems: As mentioned in the history of present illness. All other systems reviewed and are negative. Past Medical History:  Diagnosis Date   Acute respiratory failure (HCC) 05/05/2018   Acute systolic congestive heart failure (HCC) 02/03/2018   AICD (automatic cardioverter/defibrillator) present    Allergy    Anxiety    Asthma    DM2 (diabetes mellitus, type 2) (HCC) 10/20/2020   Hypertension    PAF (paroxysmal atrial fibrillation) (HCC)    Presence of permanent cardiac pacemaker    Prophylactic measure 08/03/14-08/19/14   Prophyl. 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   LIMA to LAD, SVG to D1, SVG to OM1, EVH via right thigh with implantation of Impella LD LVAD via direct aortic approach   Small cell lung cancer (HCC) 03/16/2014   Past Surgical History:  Procedure Laterality Date   BRONCHIAL BRUSHINGS  10/25/2020   Procedure: BRONCHIAL BRUSHINGS;  Surgeon: Shelah Lamar RAMAN, MD;  Location: [facility] ENDOSCOPY;  Service: Pulmonary;;   BRONCHIAL NEEDLE ASPIRATION BIOPSY  10/25/2020   Procedure: BRONCHIAL NEEDLE ASPIRATION BIOPSIES;  Surgeon: Shelah Lamar RAMAN, MD;  Location: [facility] ENDOSCOPY;  Service: Pulmonary;;   CARDIAC DEFIBRILLATOR PLACEMENT  08/15/2018   MDT Visia AF MRI VR ICD  implanted by Dr Zulma for primary prevention of sudden   CESAREAN SECTION     CORONARY ARTERY BYPASS GRAFT N/A 02/03/2018   Procedure: CORONARY ARTERY BYPASS GRAFTING (CABG);  Surgeon: Dusty Sudie DEL, MD;  Location: [facility] OR;  Service: Open Heart Surgery;  Laterality: N/A;  Time 3 using left internal mammary artery and endoscopically harvested  right saphenous vein   CORONARY BALLOON ANGIOPLASTY N/A 02/03/2018   Procedure: CORONARY BALLOON ANGIOPLASTY;  Surgeon: Jordan, Peter M, MD;  Location: [facility] INVASIVE CV LAB;  Service: Cardiovascular;  Laterality: N/A;   CORONARY STENT INTERVENTION N/A 02/12/2019   Procedure: CORONARY STENT INTERVENTION;  Surgeon: Darron Deatrice LABOR, MD;  Location: [facility] INVASIVE CV LAB;  Service: Cardiovascular;  Laterality: N/A;   CORONARY/GRAFT ACUTE MI REVASCULARIZATION N/A 02/03/2018   Procedure: Coronary/Graft Acute MI Revascularization;  Surgeon: Jordan, Peter M, MD;  Location: [facility] INVASIVE CV LAB;  Service: Cardiovascular;  Laterality: N/A;   ENDOBRONCHIAL ULTRASOUND N/A 10/25/2020   Procedure: ENDOBRONCHIAL ULTRASOUND;  Surgeon: Shelah Lamar RAMAN, MD;  Location: [facility] ENDOSCOPY;  Service: Pulmonary;  Laterality: N/A;   FLEXIBLE BRONCHOSCOPY  10/25/2020   Procedure: FLEXIBLE BRONCHOSCOPY;  Surgeon: Shelah Lamar RAMAN, MD;  Location: [facility] ENDOSCOPY;  Service: Pulmonary;;   IABP INSERTION N/A 02/03/2018   Procedure: IABP Insertion;  Surgeon: Jordan, Peter M, MD;  Location: [facility] INVASIVE CV LAB;  Service: Cardiovascular;  Laterality: N/A;   INTRAOPERATIVE TRANSESOPHAGEAL ECHOCARDIOGRAM N/A 02/03/2018   Procedure: INTRAOPERATIVE TRANSESOPHAGEAL ECHOCARDIOGRAM;  Surgeon: Dusty Sudie DEL, MD;  Location: [facility] OR;  Service: Open Heart Surgery;  Laterality: N/A;   LEFT HEART CATH AND CORONARY ANGIOGRAPHY N/A 02/03/2018   Procedure: LEFT HEART CATH AND CORONARY ANGIOGRAPHY;  Surgeon: Jordan, Peter M, MD;  Location: [facility] INVASIVE CV LAB;  Service: Cardiovascular;  Laterality: N/A;   LEFT HEART CATH AND CORS/GRAFTS ANGIOGRAPHY N/A 02/12/2019   Procedure: LEFT HEART CATH AND CORS/GRAFTS ANGIOGRAPHY;  Surgeon: Darron Deatrice LABOR, MD;  Location: [facility] INVASIVE CV LAB;  Service: Cardiovascular;  Laterality: N/A;   MEDIASTINOSCOPY N/A 03/11/2014   Procedure: MEDIASTINOSCOPY;  Surgeon: Elspeth JAYSON Millers, MD;  Location: [facility] OR;  Service: Thoracic;   Laterality: N/A;   PLACEMENT OF IMPELLA LEFT VENTRICULAR ASSIST DEVICE  02/03/2018   Procedure: PLACEMENT OF IMPELLA LEFT VENTRICULAR ASSIST DEVICE LD;  Surgeon: Dusty Sudie DEL, MD;  Location: [facility] OR;  Service: Open Heart Surgery;;   REMOVAL OF IMPELLA LEFT VENTRICULAR ASSIST DEVICE N/A 02/08/2018   Procedure: REMOVAL OF IMPELLA LEFT VENTRICULAR ASSIST DEVICE;  Surgeon: Dusty Sudie DEL, MD;  Location: [facility] OR;  Service: Open Heart Surgery;  Laterality: N/A;   RIGHT HEART CATH N/A 02/03/2018   Procedure: RIGHT HEART CATH;  Surgeon: Jordan, Peter M, MD;  Location: [facility] INVASIVE CV LAB;  Service: Cardiovascular;  Laterality: N/A;   RIGHT HEART CATH N/A 05/09/2018   Procedure: RIGHT HEART CATH;  Surgeon: Cherrie Toribio SAUNDERS, MD;  Location: [facility] INVASIVE CV LAB;  Service: Cardiovascular;  Laterality: N/A;   TEE WITHOUT CARDIOVERSION N/A 02/08/2018   Procedure: TRANSESOPHAGEAL ECHOCARDIOGRAM (TEE);  Surgeon: Dusty Sudie DEL, MD;  Location: [facility] OR;  Service: Open Heart Surgery;  Laterality: N/A;   TUBAL LIGATION     VIDEO BRONCHOSCOPY WITH ENDOBRONCHIAL ULTRASOUND N/A 03/11/2014   Procedure: VIDEO BRONCHOSCOPY WITH ENDOBRONCHIAL ULTRASOUND;  Surgeon: Elspeth JAYSON Millers, MD;  Location: [facility] OR;  Service: Thoracic;  Laterality: N/A;   Social History:  reports that she quit smoking about 10 years ago. Her smoking use included cigarettes. She started smoking  about 30 years ago. She has a 20 pack-year smoking history. She has never used smokeless tobacco. She reports current alcohol use of about 5.0 standard drinks of alcohol per week. She reports current drug use. Drug: Marijuana.  Allergies[1]  Family History  Problem Relation Age of Onset   Heart disease Mother    Hypertension Mother    Heart attack Mother    Hypertension Maternal Grandmother    Cancer Maternal Grandmother    Diabetes Paternal Grandmother    Stroke Neg Hx     Prior to Admission medications  Medication Sig Start Date End Date Taking?  Authorizing Provider  albuterol  (VENTOLIN  HFA) 108 (90 Base) MCG/ACT inhaler Inhale 2 puffs into the lungs every 6 (six) hours as needed for wheezing or shortness of breath. 06/20/22   Fairy Frames, MD  atorvastatin  (LIPITOR ) 80 MG tablet Take 1 tablet (80 mg total) by mouth daily. 10/22/24 01/20/25  Glena Harlene HERO, FNP  dapagliflozin  propanediol (FARXIGA ) 10 MG TABS tablet Take 1 tablet (10 mg total) by mouth daily. 12/31/24   Fairy Frames, MD  Digoxin  62.5 MCG TABS Take 0.0625 mg by mouth daily. 11/17/24   Colletta Manuelita Garre, PA-C  Dihydroxyaluminum Sod Carb (ROLAIDS PO) Take 1 tablet by mouth as needed.    [provider]  ELIQUIS  2.5 MG TABS tablet TAKE 1 TABLET BY MOUTH TWICE A DAY 11/11/24   [name], [place], FNP  Furosemide  (FUROSCIX ) 80 MG/10ML CTKT Inject 80 mg into the skin daily as needed (fluid and edema). Take as directed by the heart failure clinic only 01/08/25   Bensimhon, Toribio SAUNDERS, MD  hydrOXYzine  (VISTARIL ) 25 MG capsule TAKE 1 CAPSULE (25 MG TOTAL) BY MOUTH DAILY AS NEEDED. 07/30/23   Jaycee Greig PARAS, NP  ipratropium-albuterol  (DUONEB) 0.5-2.5 (3) MG/3ML SOLN TAKE 3 MLS BY NEBULIZATION EVERY 4 (FOUR) HOURS AS NEEDED (FOR SHORTNESS OF BREATH). 10/03/24   Tanda Bleacher, MD  ivabradine  (CORLANOR ) 7.5 MG TABS tablet Take 1 tablet (7.5 mg total) by mouth 2 (two) times daily with a meal. 10/22/24   [name], Harlene HERO, FNP  metolazone  (ZAROXOLYN ) 5 MG tablet Take 1 tablet (5 mg total) by mouth as needed (as instructed by Advanced HF clinic). Patient not taking: Reported on 12/26/2024 10/22/24   Glena Harlene HERO, FNP  mirtazapine  (REMERON ) 15 MG tablet Take 1 tablet (15 mg total) by mouth at bedtime. 12/31/24   Fairy Frames, MD  Multiple Vitamin (MULTIVITAMIN WITH MINERALS) TABS tablet Take 1 tablet by mouth daily. Gummies    [provider]  nitroGLYCERIN  (NITROSTAT ) 0.4 MG SL tablet Place 1 tablet (0.4 mg total) under the tongue every 5 (five) minutes as needed  for chest pain. Patient not taking: Reported on 12/26/2024 03/13/24   Lee, Jordan, NP  Nutritional Supplements (ENSURE ORIGINAL) LIQD Take 1 Bottle by mouth in the morning, at noon, in the evening, and at bedtime.    [provider]  pantoprazole  (PROTONIX ) 40 MG tablet Take 1 tablet (40 mg total) by mouth daily at 6 (six) AM. 07/21/24   Cherlyn Labella, MD  potassium chloride  (KLOR-CON ) 20 MEQ packet Take 40 mEq by mouth 2 (two) times daily. 12/31/24   Fairy Frames, MD  spironolactone  (ALDACTONE ) 25 MG tablet Take 1 tablet (25 mg total) by mouth daily. 10/22/24   [name], Harlene HERO, FNP  torsemide  (DEMADEX ) 100 MG tablet Take 1 tablet (100 mg total) by mouth 2 (two) times daily. 10/22/24   Glena Harlene HERO, FNP  Physical Exam: Vitals:   01/14/25 0736 01/14/25 0745 01/14/25 0800 01/14/25 0815  BP:  108/61 110/67 (!) 92/58  Pulse:  92 90 89  Resp:  19 (!) 21 18  Temp:      TempSrc:      SpO2: 100% 100% 100% 100%  Weight:      Height:       Constitutional: Chronically ill-appearing older female currently in no acute distress Eyes: PERRL, lids and conjunctivae normal ENMT: Mucous membranes are moist.  Normal dentition.  Neck: normal, supple.  JVD present. Respiratory: Diffuse wheezing noted in both lung fields with bibasilar crackles.  Currently off of BiPAP on 2-1/2 L of oxygen Cardiovascular: Regular rate and rhythm, positive systolic murmur.  No extremity edema. 2+ pedal pulses.  Abdomen: no tenderness, no masses palpated. No hepatosplenomegaly. Bowel sounds positive.  Musculoskeletal: no clubbing / cyanosis. No joint deformity upper and lower extremities. Good ROM, no contractures. Normal muscle tone.  Skin: no rashes, lesions, ulcers. No induration Neurologic: CN 2-12 grossly intact. Strength 5/5 in all 4.  Psychiatric: Normal judgment and insight. Alert and oriented x 3. Normal mood.   Data Reviewed:  EKG reveals sinus tachycardia 105 bpm with significant background  artifact.  Reviewed labs, imaging, pertinent records as documented.  Assessment and Plan: Acute on chronic respiratory failure Acute on chronic combined systolic and diastolic CHF Patient presents with progressively worsening shortness of breath over the last day.  Reportedly had been out of her furosemide  as well as breathing treatments last week.  Due to respiratory distress patient had been placed on BiPAP.  No significant lower extremity swelling cardiology consulted placed orders - Admitted to the cardiac progressive - Heart failure order set utilized - Continuous pulse oximetry with oxygen to maintain O2 saturations - Strict I&O's and daily weights - Trend lactic acid levels - Follow-up digoxin  level  - Lasix  80 mg IV twice daily and dobutamine  drip per cardiology - Continue spironolactone  ,ivabradine , and  digoxin  - Cardiology consulted,  will follow-up for any further recommendation  COPD exacerbation Patient noted to have diffuse wheezing and rhonchi in both lung fields.  She was given 125 mg Solu-Medrol  IV and breathing treatments. - DuoNebs scheduled and as needed - Continue Rocephin  IV - Solu-Medrol  40 mg IV daily  Leukocytosis Acute.  WBC elevated at 13.4.  Patient denies having any recent fevers or chills. - Check procalcitonin  Elevated troponin Chronic.  High-sensitivity troponin elevated at 71.  Seen to be elevated similarly previously.  Thought likely secondary to demand - Continue to monitor  Diabetes mellitus type type II with hyperglycemia Acute.  On admission glucose noted to be elevated at 370.  Labs hemoglobin A1c noted to be 6.5 when checked on 9/9 showing. - Hypoglycemic protocols - CBGs before every meal with sensitive SSI while on steroids - Adjust insulin  regimen as deemed medically appropriate  Hypokalemia Acute.  Potassium noted to be 3.1. - Replete  Paroxysmal atrial fibrillation Patient appears to be in a sinus rhythm at this time. - Continue  Eliquis   CAD Patient status post prior two-vessel CABG in 2019 and subsequent DES placement in 2020. - Continue atorvastatin   S/p Renal infarct - Continue Eliquis    Chronic kidney disease stage IIIb Creatinine noted to be 1.32 with BUN 45.   - Continue to monitor  History of lung cancer S/p treatment in 2015  DVT prophylaxis: Eliquis . Advance Care Planning:   Code Status: Limited: Do not attempt resuscitation (DNR) -DNR-LIMITED -Do Not  Intubate/DNI    Consults: Cardiology  Family Communication: Declined when asked  Severity of Illness: The appropriate patient status for this patient is INPATIENT. Inpatient status is judged to be reasonable and necessary in order to provide the required intensity of service to ensure the patient's safety. The patient's presenting symptoms, physical exam findings, and initial radiographic and laboratory data in the context of their chronic comorbidities is felt to place them at high risk for further clinical deterioration. Furthermore, it is not anticipated that the patient will be medically stable for discharge from the hospital within 2 midnights of admission.   * I certify that at the point of admission it is my clinical judgment that the patient will require inpatient hospital care spanning beyond 2 midnights from the point of admission due to high intensity of service, high risk for further deterioration and high frequency of surveillance required.*  Author: Maximino DELENA Sharps, MD 01/14/2025 8:58 AM  For on call review [email or web address].      [1]  Allergies Allergen Reactions   Sulfa Antibiotics Hives    Patient requested to have this undeleted, had this reaction about 5 years ago.   Lactose Intolerance (Gi) Diarrhea   Codeine Nausea And Vomiting   "

## 2025-01-14 NOTE — ED Provider Notes (Signed)
 " [place] [facility] Provider Note   CSN: [id number] Arrival date & time: 01/14/25  9355     Patient presents with: Shortness of Breath   Brittany [name]  is a 64 y.o. female with a past medical history significant for congestive heart failure reduced EF 20 to 25%, end-stage chronic systolic heart failure status post [employer] ICD, chronic hypoxic respiratory failure, CAD status post CABG x2 and DES drug-eluting stent placement 2020, paroxysmal atrial fibrillation on Eliquis , CKD stage III, COPD, history of renal infarction in 2022 on Eliquis , prior history of lung cancer status postchemotherapy, and diabetes who presents to the ED due to worsening shortness of breath that started yesterday.  Shortness of breath associated with cough.  No fever.  Denies associated chest pain.  Denies lower extremity edema.  Patient recently admitted to the hospital 1/16 to 1/21 due to CHF exacerbation and was placed on Lasix  drip and dobutamine  drip.  During her most recent hospital stay patient refused hospice.  Patient on 2 L nasal cannula baseline.  Prior to arrival patient given 50 mg albuterol , 1 mg Atrovent , 2 g magnesium , 125 mg Solu-Medrol  and was placed on CPAP.  HPI limited due to acuity of condition. Level 5 caveat.   History obtained from patient, EMS and past medical records. No interpreter used during encounter.       Prior to Admission medications  Medication Sig Start Date End Date Taking? Authorizing Provider  albuterol  (VENTOLIN  HFA) 108 (90 Base) MCG/ACT inhaler Inhale 2 puffs into the lungs every 6 (six) hours as needed for wheezing or shortness of breath. 06/20/22   Fairy Frames, MD  atorvastatin  (LIPITOR ) 80 MG tablet Take 1 tablet (80 mg total) by mouth daily. 10/22/24 01/20/25  Glena Harlene HERO, FNP  dapagliflozin  propanediol (FARXIGA ) 10 MG TABS tablet Take 1 tablet (10 mg total) by mouth daily. 12/31/24   Fairy Frames, MD  Digoxin  62.5  MCG TABS Take 0.0625 mg by mouth daily. 11/17/24   Colletta Manuelita Garre, PA-C  Dihydroxyaluminum Sod Carb (ROLAIDS PO) Take 1 tablet by mouth as needed.    [provider]  ELIQUIS  2.5 MG TABS tablet TAKE 1 TABLET BY MOUTH TWICE A DAY 11/11/24   [name], Penalosa, FNP  Furosemide  (FUROSCIX ) 80 MG/10ML CTKT Inject 80 mg into the skin daily as needed (fluid and edema). Take as directed by the heart failure clinic only 01/08/25   Bensimhon, Toribio SAUNDERS, MD  hydrOXYzine  (VISTARIL ) 25 MG capsule TAKE 1 CAPSULE (25 MG TOTAL) BY MOUTH DAILY AS NEEDED. 07/30/23   Jaycee Greig PARAS, NP  ipratropium-albuterol  (DUONEB) 0.5-2.5 (3) MG/3ML SOLN TAKE 3 MLS BY NEBULIZATION EVERY 4 (FOUR) HOURS AS NEEDED (FOR SHORTNESS OF BREATH). 10/03/24   Tanda Bleacher, MD  ivabradine  (CORLANOR ) 7.5 MG TABS tablet Take 1 tablet (7.5 mg total) by mouth 2 (two) times daily with a meal. 10/22/24   [name], Harlene HERO, FNP  metolazone  (ZAROXOLYN ) 5 MG tablet Take 1 tablet (5 mg total) by mouth as needed (as instructed by Advanced HF clinic). Patient not taking: Reported on 12/26/2024 10/22/24   Glena Harlene HERO, FNP  mirtazapine  (REMERON ) 15 MG tablet Take 1 tablet (15 mg total) by mouth at bedtime. 12/31/24   Fairy Frames, MD  Multiple Vitamin (MULTIVITAMIN WITH MINERALS) TABS tablet Take 1 tablet by mouth daily. Gummies    [provider]  nitroGLYCERIN  (NITROSTAT ) 0.4 MG SL tablet Place 1 tablet (0.4 mg total) under the tongue every 5 (five)  minutes as needed for chest pain. Patient not taking: Reported on 12/26/2024 03/13/24   Lee, Jordan, NP  Nutritional Supplements (ENSURE ORIGINAL) LIQD Take 1 Bottle by mouth in the morning, at noon, in the evening, and at bedtime.    [provider]  pantoprazole  (PROTONIX ) 40 MG tablet Take 1 tablet (40 mg total) by mouth daily at 6 (six) AM. 07/21/24   Cherlyn Labella, MD  potassium chloride  (KLOR-CON ) 20 MEQ packet Take 40 mEq by mouth 2 (two) times daily. 12/31/24   Fairy Frames, MD  spironolactone  (ALDACTONE ) 25 MG tablet Take 1 tablet (25 mg total) by mouth daily. 10/22/24   [name], Harlene HERO, FNP  torsemide  (DEMADEX ) 100 MG tablet Take 1 tablet (100 mg total) by mouth 2 (two) times daily. 10/22/24   Glena Harlene HERO, FNP    Allergies: Sulfa antibiotics, Lactose intolerance (gi), and Codeine    Review of Systems  Constitutional:  Negative for fever.  Respiratory:  Positive for cough and shortness of breath.   Cardiovascular:  Negative for chest pain.    Updated Vital Signs BP (!) 92/58   Pulse 89   Temp 97.6 F (36.4 C) (Oral)   Resp 18   Ht 4' 11 (1.499 m)   Wt 36.3 kg   SpO2 100%   BMI 16.16 kg/m   Physical Exam Vitals and nursing note reviewed.  Constitutional:      General: She is not in acute distress.    Appearance: She is not ill-appearing.  HENT:     Head: Normocephalic.  Eyes:     Pupils: Pupils are equal, round, and reactive to light.  Cardiovascular:     Rate and Rhythm: Normal rate and regular rhythm.     Pulses: Normal pulses.     Heart sounds: Normal heart sounds. No murmur heard.    No friction rub. No gallop.  Pulmonary:     Effort: Pulmonary effort is normal.     Breath sounds: Rhonchi and rales present.  Abdominal:     General: Abdomen is flat. There is no distension.     Palpations: Abdomen is soft.     Tenderness: There is no abdominal tenderness. There is no guarding or rebound.  Musculoskeletal:        General: Normal range of motion.     Cervical back: Neck supple.     Comments: No lower extremity edema  Skin:    General: Skin is warm and dry.  Neurological:     General: No focal deficit present.     Mental Status: She is alert.  Psychiatric:        Mood and Affect: Mood normal.        Behavior: Behavior normal.     (all labs ordered are listed, but only abnormal results are displayed) Labs Reviewed  COMPREHENSIVE METABOLIC PANEL WITH GFR - Abnormal; Notable for the following components:       Result Value   Sodium 134 (*)    Potassium 3.1 (*)    Chloride 90 (*)    Glucose, Bld 370 (*)    BUN 45 (*)    Creatinine, Ser 1.32 (*)    Calcium  10.4 (*)    GFR, Estimated 45 (*)    Anion gap 16 (*)    All other components within normal limits  PRO BRAIN NATRIURETIC PEPTIDE - Abnormal; Notable for the following components:   Pro Brain Natriuretic Peptide 13,411.0 (*)    All other components within normal  limits  CBC WITH DIFFERENTIAL/PLATELET - Abnormal; Notable for the following components:   WBC 13.4 (*)    RBC 5.25 (*)    Hemoglobin 15.4 (*)    HCT 47.0 (*)    RDW 17.2 (*)    Neutro Abs 10.2 (*)    Basophils Absolute 0.2 (*)    Abs Immature Granulocytes 0.10 (*)    All other components within normal limits  I-STAT CHEM 8, ED - Abnormal; Notable for the following components:   Sodium 134 (*)    Potassium 2.9 (*)    Chloride 91 (*)    BUN 46 (*)    Creatinine, Ser 1.50 (*)    Glucose, Bld 339 (*)    Hemoglobin 17.3 (*)    HCT 51.0 (*)    All other components within normal limits  I-STAT CG4 LACTIC ACID, ED - Abnormal; Notable for the following components:   Lactic Acid, Venous 2.2 (*)    All other components within normal limits  I-STAT VENOUS BLOOD GAS, ED - Abnormal; Notable for the following components:   pO2, Ven 46 (*)    Bicarbonate 31.8 (*)    TCO2 34 (*)    Acid-Base Excess 4.0 (*)    Sodium 132 (*)    Potassium 2.9 (*)    HCT 50.0 (*)    Hemoglobin 17.0 (*)    All other components within normal limits  TROPONIN T, HIGH SENSITIVITY - Abnormal; Notable for the following components:   Troponin T High Sensitivity 71 (*)    All other components within normal limits  RESP PANEL BY RT-PCR (RSV, FLU A&B, COVID)  RVPGX2  PROTIME-INR  DIGOXIN  LEVEL  I-STAT CG4 LACTIC ACID, ED  TROPONIN T, HIGH SENSITIVITY    EKG: EKG Interpretation Date/Time:  Wednesday January 14 2025 06:52:36 EST Ventricular Rate:  105 PR Interval:  137 QRS Duration:  175 QT  Interval:  410 QTC Calculation: 542 R Axis:   -36  Text Interpretation: Poor quality data, interpretation may be affected Sinus tachycardia Right atrial enlargement Nonspecific IVCD with LAD Left ventricular hypertrophy Borderline T abnormalities, lateral leads Artifact in lead(s) I II III aVR aVL aVF V1 V2 V3 V4 V5 V6 Confirmed by Griselda Norris [phone]) on 01/14/2025 7:04:07 AM  Radiology: [facility] Chest Port 1 View Result Date: 01/14/2025 EXAM: 1 VIEW XRAY OF THE CHEST 01/14/2025 07:01:14 AM COMPARISON: 12/27/2024 CLINICAL HISTORY: 64 year old female. Shortness of breath for 2 days, history of treated lung cancer. FINDINGS: LINES, TUBES AND DEVICES: Neurostimulator in right chest. Left chest pacemaker/AICD with leads in right ventricle. LUNGS AND PLEURA: Stable large lung volumes. Coarse right hilar streaky opacity appears stable and by CT appeared to be post radiation lung changes. No pleural effusion. No pneumothorax. HEART AND MEDIASTINUM: Unchanged cardiomegaly. Aortic calcification. Cardiac surgical changes in mediastinum. BONES AND SOFT TISSUES: Intact sternotomy wires. No acute osseous abnormality. IMPRESSION: 1. No acute cardiopulmonary abnormality. Stable chronic and post treatment changes in the chest. Electronically signed by: Helayne Hurst MD 01/14/2025 07:10 AM EST RP Workstation: [id number]     .Critical Care  Performed by: Lorelle Aleck BROCKS, PA-C Authorized by: Lorelle Aleck BROCKS, PA-C   Critical care provider statement:    Critical care time (minutes):  33   Critical care was necessary to treat or prevent imminent or life-threatening deterioration of the following conditions:  Cardiac failure, respiratory failure and circulatory failure   Critical care was time spent personally by me on the following activities:  Development of treatment  plan with patient or surrogate, discussions with consultants, evaluation of patient's response to treatment, examination of patient, ordering and  review of laboratory studies, ordering and review of radiographic studies, ordering and performing treatments and interventions, pulse oximetry, re-evaluation of patient's condition and review of old charts   I assumed direction of critical care for this patient from another provider in my specialty: no     Care discussed with: admitting provider      Medications Ordered in the ED  potassium chloride  10 mEq in 100 mL IVPB (has no administration in time range)  DOBUTamine  (DOBUTREX ) infusion 4000 mcg/mL (2.5 mcg/kg/min  36.3 kg Intravenous New Bag/Given 01/14/25 0747)  furosemide  (LASIX ) injection 80 mg (has no administration in time range)    Clinical Course as of 01/14/25 0909  Wed Jan 14, 2025  0704 Dr. Griselda discussed with Dr. Anner with cardiology who will coordinate HF team to see patient [CA]  0705 Reassessed patient at bedside.  Rechecked BP systolic 116.  Patient notes some improvement on BiPAP. [CA]  0723 Temp: 97.6 F (36.4 C) [CA]  0734 Pro Brain Natriuretic Peptide(!): 13,411.0 [CA]  0748 Reassessed patient.  Dobutamine  drip started.  BP 108/67. [CA]  [id number] Reassessed patient at bedside.  BP 99/67. Mottled skin to knees appears improved.  [CA]    Clinical Course User Index [CA] Lorelle Aleck BROCKS, PA-C                                 Medical Decision Making Amount and/or Complexity of Data Reviewed Independent Historian: EMS External Data Reviewed: notes.    Details: Recent admission note Labs: ordered. Decision-making details documented in ED Course. Radiology: ordered and independent interpretation performed. Decision-making details documented in ED Course. ECG/medicine tests: ordered and independent interpretation performed. Decision-making details documented in ED Course.  Risk Prescription drug management. Decision regarding hospitalization.   This patient presents to the ED for concern of SOB, this involves an extensive number of treatment options, and is a  complaint that carries with it a high risk of complications and morbidity.  The differential diagnosis includes CHF, COPD, PNA, PE, ACS, etc  64 year old female with history of CHF and COPD with chronic respiratory failure on 2 L nasal cannula at baseline presents to the ED due to shortness of breath that started yesterday associated with cough.  Recent admission and discharged on 1/16 for CHF and COPD. Most recent echocardiogram in Jaunary 2026 with EF <20%  Prior to arrival patient given albuterol , Atrovent , magnesium , Solu-Medrol  and placed on CPAP.  Upon arrival respiratory at bedside and placed on BiPAP. BP stable in 120s systolic. JVD present. No lower extremity edema. Patient speaking in broken up sentences. Lungs with rales and rhonchi.  Case discussed with cardiology.  See note above.  Labs ordered.  Chest x-ray. Discussed code status with patient with HF team PA-C at bedside, patient confirmed DNR/DNI status.  Patient's shortness of breath likely multifactorial with COPD and CHF.  Patient does have mottled skin to knee. Good femoral pulses. Discussed with Dr. Griselda who was at bedside during initial evaluation and agrees with assessment and plan. HF pl;aced orders for Lasix  and dobutamine  drip.   Chem-8 with hypokalemia 2.9.  IV potassium ordered.  Lactic acid elevated at 2.2.  CMP with hypokalemia 3.1.  Elevated creatinine 1.3 and BUN of 45.  Slight anion gap of 16.  Normal bicarb. BNP elevated 13,000.  Chest  x-ray personally reviewed and interpreted which is negative for evidence of pneumonia.  Does demonstrate stable chronic and posttreatment changes in chest.  Discussed with HF team who placed order for lasix  and Dobutamine . Recommends hospitalist admission. HF team will follow.   Patient reassessed numerous times.  See notes above.  Patient does appear to have some improvement after BiPAP.  Mottled skin improved.  Co morbidities that complicate the patient evaluation  COPD, CHF Cardiac  Monitoring: / EKG:  The patient was maintained on a cardiac monitor.  I personally viewed and interpreted the cardiac monitored which showed an underlying rhythm of: poor quality EKG with artifact, sinus tachycardia??  Social Determinants of Health:  Lives independently  Test / Admission - Considered:  Admit for respiratory failure likely secondary to COPD/CHF.   9:08 AM Discussed with Dr. Claudene with [facility] who agrees to admit patient     Final diagnoses:  Acute on chronic respiratory failure, unspecified whether with hypoxia or hypercapnia (HCC)  COPD exacerbation (HCC)  Acute on chronic congestive heart failure, unspecified heart failure type [facility])    ED Discharge Orders     None          Lorelle Aleck BROCKS, [place] 01/14/25 9066  "

## 2025-01-15 ENCOUNTER — Other Ambulatory Visit: Payer: Self-pay

## 2025-01-15 ENCOUNTER — Other Ambulatory Visit (HOSPITAL_COMMUNITY): Payer: Self-pay

## 2025-01-15 LAB — LACTIC ACID, PLASMA: Lactic Acid, Venous: 2.2 mmol/L (ref 0.5–1.9)

## 2025-01-15 LAB — CBG MONITORING, ED
Glucose-Capillary: 202 mg/dL — ABNORMAL HIGH (ref 70–99)
Glucose-Capillary: 260 mg/dL — ABNORMAL HIGH (ref 70–99)

## 2025-01-15 LAB — COOXEMETRY PANEL
Carboxyhemoglobin: 2.3 % — ABNORMAL HIGH (ref 0.5–1.5)
Methemoglobin: <0.7 % (ref 0.0–1.5)
O2 Saturation: 83.9 %
Total hemoglobin: 13.1 g/dL (ref 12.0–16.0)

## 2025-01-15 LAB — BASIC METABOLIC PANEL WITH GFR
Anion gap: 12 (ref 5–15)
BUN: 47 mg/dL — ABNORMAL HIGH (ref 8–23)
CO2: 29 mmol/L (ref 22–32)
Calcium: 9.9 mg/dL (ref 8.9–10.3)
Chloride: 94 mmol/L — ABNORMAL LOW (ref 98–111)
Creatinine, Ser: 1.58 mg/dL — ABNORMAL HIGH (ref 0.44–1.00)
GFR, Estimated: 36 mL/min — ABNORMAL LOW
Glucose, Bld: 253 mg/dL — ABNORMAL HIGH (ref 70–99)
Potassium: 3.9 mmol/L (ref 3.5–5.1)
Sodium: 134 mmol/L — ABNORMAL LOW (ref 135–145)

## 2025-01-15 LAB — GLUCOSE, CAPILLARY
Glucose-Capillary: 194 mg/dL — ABNORMAL HIGH (ref 70–99)
Glucose-Capillary: 314 mg/dL — ABNORMAL HIGH (ref 70–99)

## 2025-01-15 LAB — CBC
HCT: 41.2 % (ref 36.0–46.0)
Hemoglobin: 13.3 g/dL (ref 12.0–15.0)
MCH: 28.5 pg (ref 26.0–34.0)
MCHC: 32.3 g/dL (ref 30.0–36.0)
MCV: 88.4 fL (ref 80.0–100.0)
Platelets: 203 10*3/uL (ref 150–400)
RBC: 4.66 MIL/uL (ref 3.87–5.11)
RDW: 17.2 % — ABNORMAL HIGH (ref 11.5–15.5)
WBC: 11.4 10*3/uL — ABNORMAL HIGH (ref 4.0–10.5)
nRBC: 0 % (ref 0.0–0.2)

## 2025-01-15 LAB — MRSA NEXT GEN BY PCR, NASAL: MRSA by PCR Next Gen: NOT DETECTED

## 2025-01-15 MED ORDER — FUROSEMIDE 10 MG/ML IJ SOLN
40.0000 mg | Freq: Once | INTRAMUSCULAR | Status: AC
  Administered 2025-01-15: 40 mg via INTRAVENOUS
  Filled 2025-01-15: qty 4

## 2025-01-15 MED ORDER — ALPRAZOLAM 0.5 MG PO TABS
0.5000 mg | ORAL_TABLET | Freq: Three times a day (TID) | ORAL | Status: AC | PRN
  Administered 2025-01-15: 0.5 mg via ORAL
  Filled 2025-01-15: qty 2

## 2025-01-15 MED ORDER — BUDESONIDE 0.25 MG/2ML IN SUSP
0.2500 mg | Freq: Two times a day (BID) | RESPIRATORY_TRACT | Status: DC
  Administered 2025-01-15 – 2025-01-16 (×3): 0.25 mg via RESPIRATORY_TRACT
  Filled 2025-01-15 (×3): qty 2

## 2025-01-15 MED ORDER — MIRTAZAPINE 7.5 MG PO TABS
15.0000 mg | ORAL_TABLET | Freq: Every day | ORAL | Status: AC
  Administered 2025-01-15 – 2025-01-16 (×2): 15 mg via ORAL
  Filled 2025-01-15 (×2): qty 2

## 2025-01-15 MED ORDER — FUROSEMIDE 10 MG/ML IJ SOLN
120.0000 mg | Freq: Two times a day (BID) | INTRAVENOUS | Status: AC
  Administered 2025-01-15 – 2025-01-16 (×3): 120 mg via INTRAVENOUS
  Filled 2025-01-15: qty 2
  Filled 2025-01-15 (×2): qty 12
  Filled 2025-01-15: qty 2

## 2025-01-15 MED ORDER — GUAIFENESIN ER 600 MG PO TB12
600.0000 mg | ORAL_TABLET | Freq: Two times a day (BID) | ORAL | Status: DC | PRN
  Administered 2025-01-15: 600 mg via ORAL
  Filled 2025-01-15: qty 1

## 2025-01-15 MED ORDER — POTASSIUM CHLORIDE CRYS ER 20 MEQ PO TBCR
20.0000 meq | EXTENDED_RELEASE_TABLET | Freq: Once | ORAL | Status: AC
  Filled 2025-01-15: qty 1

## 2025-01-15 NOTE — Assessment & Plan Note (Addendum)
 Echocardiogram with reduced LV systolic function EF <20%, severe dilatation of internal cavity, EA fusion, RV systolic function with mild reduction, mild RV enlargement, RVSP 52.1 LA with severe dilatation, RV with mild dilatation, moderate to severe mitral regurgitation, mild to moderate tricuspid regurgitation,   Continue volume overloaded Low output state with elevated lactic acid   Plan to continue diuresis with furosemide  80 mg IV bid  Continue SGLT 2, ivabradine , spironolactone  and digoxin  Inotropic support with dobutamine  No afterload reduction due to risk of hypotension  Patient with advanced heart failure, poor prognosis.   Acute hypoxemic respiratory failure due to acute cardiogenic pulmonary edema Continue diuresis and 02 supplementation This am she is off bipap to a [place] 2 L.min with 02 saturation 100%

## 2025-01-15 NOTE — Consult Note (Signed)
 "                              Consultation Note Date: 01/15/2025   Patient Name: Brittany [name]   DOB: [birth year]  MRN: [id number]  Age / Sex: 64 y.o., female  PCP: Tanda Bleacher, MD Referring Physician: Noralee Elidia Toribio DEWAINE  Reason for Consultation: Establishing goals of care  HPI/Patient Profile: 64 y.o. 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   Patient reports that she ran out of her medications for about a week.  After arriving to the ED via EMS, she was admitted for management of acute on chronic respiratory failure/COPD exacerbation and acute on chronic end-stage biventricular heart failure.  PMT has been consulted to assist with goals of care conversation.  She has had 3 admissions in the past 6 months, most recently less than 30 days ago. Per chart review, patient continues to state she is not ready for hospice.  She is well-known to PMT from previous hospitalizations, most recently seen by PMT in August 2025.  Clinical Assessment and Goals of Care:  I have reviewed medical records including EPIC notes, labs and imaging, discussed with AHF team, assessed the patient and had a phone conversation with patient's sister to discuss diagnosis prognosis, GOC, EOL wishes, disposition and options.  I introduced Palliative Medicine as specialized medical care for people living with serious illness. It focuses on providing relief from the symptoms and stress of a serious illness. The goal is to improve quality of life for both the patient and the family.  We discussed a brief life review of the patient and then focused on their current illness.  The natural  disease trajectory and expectations at EOL were discussed.  I attempted to elicit values and goals of care important to the patient.    Medical History Review and Understanding:  We discussed patient's acute illness in the context of their chronic comorbidities.  Patient's sister understands the severity of patient's illness.  Social History: She lives at home alone an extended stay motel. 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 She has 1 son and 4 grandchildren. She also has a significant other (he is a agricultural consultant and works long stretches).   Functional and Nutritional State: Patient able to ambulate without assistance.  She has refused home health services last admission.  Albumin  of 4.1 on 2/4.  Palliative Symptoms: Dyspnea; denies other symptoms  Advance Directives: Reviewed documentation from 04/12/2022.  Sister Brittany [name] is primary HCPOA, followed by her aunt.  Code Status: Concepts specific to code status, artifical feeding and hydration, and rehospitalization were considered and discussed.   Discussion: Patient initially told me she did not recall previous PMT involvement or what palliative care entails.  I discussed her current care plan with her as well as possibility of returning home with IV dobutamine , exploring her thoughts and feelings on this.  She tells me she did not understand that this would be IV, thinking it would be a patch.  She does not want to do this at home if it is  a continuous infusion.  She is also not ready for hospice. Provided education on the risks and benefit of low-dose morphine  to address symptom of dyspnea while continuing curative efforts.  She is adamantly against this.  She continues to desire treating the treatable and continues to find living in her home/motel to be acceptable quality of life.  She is agreeable to a call to her sister for  support as well. Patient's sister shares that she is also unaware of previous PMT involvement or what this means.  I provided review of the above conversation and patient's goals as stated today.  She voices her frustration that Jailani does not want assistance at home, as she has a hard time leaving work herself to help.  She also feels that she has not been getting the full story about the severity of patient's illness. Provided extensive education and counseling on patient's disease trajectory, reviewing that patient's heart failure is end-stage with a poor prognosis of months/hospice eligibility if she would be ready.  We reviewed echo from last admission, showing EF less than 20%.  Also discussed that she had 4 admissions each year in 2022 through 2024, increasing to 5 admissions in 2025 and now already 2 admissions this year.  Reviewed why this is associated with worsening prognosis, as well as physiology and relationship between patient's heart failure and dyspnea.  She found this very helpful.  Provided with PMT contact information should she need additional support if she attempts to assist patient in navigating her complex medical decision making.   The difference between aggressive medical intervention and comfort care was considered in light of the patient's goals of care. Hospice and Palliative Care services outpatient were explained and offered.   Discussed the importance of continued conversation with family and the medical providers regarding overall plan of care and treatment options, ensuring decisions are within the context of the patients values and GOCs.   Questions and concerns were addressed. The family was encouraged to call with questions or concerns.  PMT will continue to support holistically.    SUMMARY OF RECOMMENDATIONS   - Continue DNR/DNI - Continue supportive care and treating the treatable as this is patient's preference and she is not ready for hospice - Ongoing goals  of care discussions.  Patient seems confused on what dobutamine  at home would look like; would not want to do IV but would be okay with patch.  Counseled that this is not an option - Education and counseling provided for patient's sister/HCPOA - PMT continue to follow and support   Prognosis:  Poor prognosis without many additional treatment options and increased frequency of hospitalizations for acute on chronic illnesses  Discharge Planning: To Be Determined      Primary Diagnoses: Present on Admission:  COPD with acute exacerbation (HCC)  PAF (paroxysmal atrial fibrillation) (HCC)  CAD (coronary artery disease)  Chronic kidney disease, stage 3b (HCC)  Acute on chronic systolic CHF (congestive heart failure) (HCC)  Essential hypertension  Small cell lung cancer (HCC)   Physical Exam Vitals and nursing note reviewed.  Constitutional:      General: She is not in acute distress.    Appearance: She is ill-appearing.  HENT:     Head: Normocephalic and atraumatic.  Cardiovascular:     Rate and Rhythm: Normal rate.  Pulmonary:     Effort: Pulmonary effort is normal. No respiratory distress.  Neurological:     Mental Status: She is alert.  Psychiatric:  Behavior: Behavior normal.    Vital Signs: BP (!) 105/54   Pulse 91   Temp 98.4 F (36.9 C)   Resp 16   Ht 4' 11 (1.499 m)   Wt 36.3 kg   SpO2 100%   BMI 16.16 kg/m  Pain Scale: 0-10   Pain Score: Asleep   SpO2: SpO2: 100 % O2 Device:SpO2: 100 % O2 Flow Rate: .O2 Flow Rate (L/min): 3 L/min    Pennelope Basque SHAUNNA Fell, PA-C  Palliative Medicine Team Team phone # [phone]  Thank you for allowing the Palliative Medicine Team to assist in the care of this patient. Please utilize secure chat with additional questions, if there is no response within 30 minutes please call the above phone number.  Palliative Medicine Team providers are available by phone from 7am to 7pm daily and can be reached through the  team cell phone.  Should this patient require assistance outside of these hours, please call the patient's attending physician.   I personally spent a total of 75 minutes in the care of the patient today including preparing to see the patient, getting/reviewing separately obtained history, performing a medically appropriate exam/evaluation, counseling and educating, referring and communicating with other health care professionals, and documenting clinical information in the EHR.   "

## 2025-01-15 NOTE — Progress Notes (Signed)
 " Progress Note   Patient: Brittany [name]  FMW:[id number] DOB: 1961/12/07 DOA: 01/14/2025     1 DOS: the patient was seen and examined on 01/15/2025   Brief hospital course: Brittany [name]  was admitted to the hospital with the working diagnosis of heart failure exacerbation.   64 yo female with the past medical history of heart failure, coronary artery disease, paroxysmal atrial fibrillation, COPD, T2DM and lung cancer who presented with dyspnea for the last 2 days prior to admission.  Reported sudden onset of dyspnea, with no edema.  Because severity of symptoms EMS was called, she was found with increased work of breathing, received bronchodilator therapy, steroids and placed on Cpap, then transported to the ED.  On her initial physical examination her blood pressure was 108/61, HR 89, RR 21 and 02 saturation 100% on Bipap Lungs with diffuse wheezing bilaterally with rales and  rhonchi, heart with S1 and S2 present and regular, positive systolic murmur, abdomen with no distention and no lower extremity edema.   Na 134, K 3.1 Cl 90 bicarbonate 29, glucose 370 bun 45 cr 1.32  Pro BNP 13,411 Wbc 13.4 hgb 15.4 plt 270  Sars covid 19 negative RSV negative Influenza negative   Chest radiograph with hyperinflation, positive cardiomegaly, bilateral hilar vascular congestion, fluid in the right fissure, small right pleural effusion, sternotomy wires in place, defibrillator in place. Neuro stimulator in the right chest   EKG 105 bpm, left axis deviation, qtc 542, sinus rhythm with right atrial enlargement, with no significant ST segment or T wave changes  Patient placed on IV furosemide  and IV dobutamine    Assessment and Plan: * Acute on chronic systolic CHF (congestive heart failure) (HCC) Echocardiogram with reduced LV systolic function EF <20%, severe dilatation of internal cavity, EA fusion, RV systolic function with mild reduction, mild RV enlargement, RVSP 52.1 LA with severe  dilatation, RV with mild dilatation, moderate to severe mitral regurgitation, mild to moderate tricuspid regurgitation,   Continue volume overloaded Low output state with elevated lactic acid   Plan to continue diuresis with furosemide  80 mg IV bid  Continue SGLT 2, ivabradine , spironolactone  and digoxin  Inotropic support with dobutamine  No afterload reduction due to risk of hypotension  Patient with advanced heart failure, poor prognosis.   Acute hypoxemic respiratory failure due to acute cardiogenic pulmonary edema Continue diuresis and 02 supplementation This am she is off bipap to a [place] 2 L.min with 02 saturation 100%   CAD (coronary artery disease) No chest pain, no acute coronary syndrome   PAF (paroxysmal atrial fibrillation) (HCC) Continue ivabradine  for rate control and anticoagulation with apixaban   Essential hypertension Continue blood pressure monitoring, continue diuresis with IV furosemide   Spironolactone    COPD with acute exacerbation (HCC) Continue bronchodilator therapy and inhaled steroids Discontinue antibiotic therapy and systemic corticosteroids, likely the majority of her symptoms are related to pulmonary edema,  Airway clearing techniques with flutter valve and incentive spirometer.   Chronic kidney disease, stage 3b (HCC) Renal function with serum cr at 1.58 with K at 3.9 and serum bicarbonate at 24  Na 134.   Plan to continue diuresis with furosemide  80 mg IV bid Follow up renal function and electrolytes    Non-insulin  dependent type 2 diabetes mellitus (HCC) Hyperglycemia.  Continue insulin  sliding scale for glucose cover and monitoring Discontinue systemic steroids   Small cell lung cancer (HCC) Follow up as outpatient         Subjective: Patient with no chest pain, continue to have  dyspnea and congestion, feeling very weak and frustrated, she wants to go home   Physical Exam: Vitals:   01/15/25 0345 01/15/25 0640 01/15/25 0700 01/15/25  0732  BP: (!) 101/51 (!) 99/58 (!) 104/52   Pulse: (!) 105 (!) 103 (!) 103   Resp:  19 16   Temp:    98.3 F (36.8 C)  TempSrc:      SpO2: 100% 100% 100%   Weight:      Height:       Neurology awake and alert ENT with mild pallor Cardiovascular with S1 and S2 present and regular with no gallops or rubs, positive systolic murmur at the apex  Positive JVD Respiratory with positive expiratory wheezing and rhonchi, with scattered rales. Abdomen with no distention, soft and non tender No lower extremity edema   Data Reviewed:    Family Communication: no family at the beside   Disposition: Status is: Inpatient Remains inpatient appropriate because: Iv diuresis dobutamine    Planned Discharge Destination: Home     Author: Elidia Toribio Furnace, MD 01/15/2025 8:35 AM  For on call review [email or web address].  "

## 2025-01-15 NOTE — ED Notes (Signed)
 Does not want morning meds until breakfast I given, pt wants a new tray b/c there is blood on current one pt states its not hers however she doe have blood on her hands states she doesn't know where that came from.

## 2025-01-15 NOTE — Plan of Care (Signed)
   Problem: Coping: Goal: Ability to adjust to condition or change in health will improve Outcome: Progressing   Problem: Fluid Volume: Goal: Ability to maintain a balanced intake and output will improve Outcome: Progressing   Problem: Metabolic: Goal: Ability to maintain appropriate glucose levels will improve Outcome: Progressing   Problem: Nutritional: Goal: Maintenance of adequate nutrition will improve Outcome: Progressing

## 2025-01-15 NOTE — Assessment & Plan Note (Signed)
 Hyperglycemia.  Continue insulin  sliding scale for glucose cover and monitoring Discontinue systemic steroids

## 2025-01-15 NOTE — Assessment & Plan Note (Signed)
No chest pain, no acute coronary syndrome.  

## 2025-01-15 NOTE — Assessment & Plan Note (Signed)
 Renal function with serum cr at 1.58 with K at 3.9 and serum bicarbonate at 24  Na 134.   Plan to continue diuresis with furosemide  80 mg IV bid Follow up renal function and electrolytes

## 2025-01-15 NOTE — Progress Notes (Addendum)
 "    Advanced Heart Failure Rounding Note  Cardiologist: None  [facility] Cardiologist: Dr. Cherrie  Chief Complaint: A/c End-Stage Systolic Heart Failure w/ Low Output  Patient Profile   64 y.o. female with end-stage systolic heart failure HF w/ numerous admissions over the last year for CHF and COPD requiring inotropic support. Also w/ PAF, CAD s/p emergent CABG in 2019 and DES in 2020, R renal infarct in 2022, previous smoker quit in 2015, hypertension, previous small cell lung cancer treated with chemo, chest XRT and prophylactic brain radiation, presenting w/ a/c hypoxic respiratory failure and a/c NYHA Class IV CHF w/ low output.    Significant events:   2/4: Initial LA 2.5>>started DBA 2.5>>repeat LA 5.0>>DBA increased to 5 mcg  Subjective:    Breathing improving, now off BiPAP. On [place]. C/w wheezing.  Frustrated that she is still in the ED waiting on bed. Wants to go home but still resistant to palliative/ hospice support at home.   On DBA 5 mcg. Co-ox pending  SCr stable at 1.58, K 3.9    Objective:   Weight Range: 36.3 kg Body mass index is 16.16 kg/m.   Vital Signs:   Temp:  [97.2 F (36.2 C)-98.3 F (36.8 C)] 98.3 F (36.8 C) (02/05 0732) Pulse Rate:  [76-105] 103 (02/05 0700) Resp:  [14-29] 16 (02/05 0700) BP: (86-125)/(44-72) 104/52 (02/05 0700) SpO2:  [96 %-100 %] 100 % (02/05 0700)    Weight change: Filed Weights   01/14/25 0700  Weight: 36.3 kg    Intake/Output:   Intake/Output Summary (Last 24 hours) at 01/15/2025 0934 Last data filed at 01/14/2025 1113 Gross per 24 hour  Intake 42.38 ml  Output --  Net 42.38 ml     Physical Exam   General:  chronically ill, cachetic appearing. Wheezing but no increased WOB    Cor: Regular rhythm, tachy rate. No murmurs. JVD elevated to jaw  Lungs: course b/l, + wheezing  Extremities: no edema   Telemetry   Sinus tach, 105 bpm. Barostim artifact   Labs   CBC Recent Labs    01/14/25 0655 01/14/25 0705  01/15/25 0332  WBC 13.4*  --  11.4*  NEUTROABS 10.2*  --   --   HGB 15.4* 17.3*  17.0* 13.3  HCT 47.0* 51.0*  50.0* 41.2  MCV 89.5  --  88.4  PLT 270  --  203   Basic Metabolic Panel Recent Labs    97/95/73 0655 01/14/25 0705 01/15/25 0332  NA 134* 134*  132* 134*  K 3.1* 2.9*  2.9* 3.9  CL 90* 91* 94*  CO2 29  --  29  GLUCOSE 370* 339* 253*  BUN 45* 46* 47*  CREATININE 1.32* 1.50* 1.58*  CALCIUM  10.4*  --  9.9   Liver Function Tests Recent Labs    01/14/25 0655  AST 40  ALT 29  ALKPHOS 107  BILITOT 0.5  PROT 7.4  ALBUMIN  4.1   No results for input(s): LIPASE, AMYLASE in the last 72 hours. Cardiac Enzymes No results for input(s): CKTOTAL, CKMB, CKMBINDEX, TROPONINI in the last 72 hours.  BNP: BNP (last 3 results) Recent Labs    09/16/24 1115 10/22/24 1526 11/17/24 1618  BNP 1,596.2* 1,589.7* 1,898.1*    ProBNP (last 3 results) Recent Labs    12/26/24 2003 01/14/25 0655  PROBNP 12,902.0* 13,411.0*     D-Dimer No results for input(s): DDIMER in the last 72 hours. Hemoglobin A1C No results for input(s): HGBA1C in the  last 72 hours. Fasting Lipid Panel No results for input(s): CHOL, HDL, LDLCALC, TRIG, CHOLHDL, LDLDIRECT in the last 72 hours. Medications:   Scheduled Medications:  apixaban   2.5 mg Oral BID   atorvastatin   80 mg Oral Daily   Chlorhexidine  Gluconate Cloth  6 each Topical Daily   dapagliflozin  propanediol  10 mg Oral Daily   digoxin   0.0625 mg Oral Daily   feeding supplement  1 Bottle Oral QID   furosemide   80 mg Intravenous BID   insulin  aspart  0-5 Units Subcutaneous QHS   insulin  aspart  0-9 Units Subcutaneous TID WC   ipratropium-albuterol   3 mL Nebulization QID   ivabradine   7.5 mg Oral BID WC   methylPREDNISolone  (SOLU-MEDROL ) injection  40 mg Intravenous Q24H   pantoprazole   40 mg Oral Q0600   sodium chloride  flush  3 mL Intravenous Q12H   spironolactone   25 mg Oral Daily     Infusions:  cefTRIAXone  (ROCEPHIN )  IV Stopped (01/14/25 1113)   DOBUTamine  7.5 mcg/kg/min (01/15/25 0643)    PRN Medications: acetaminophen  **OR** acetaminophen , albuterol , ALPRAZolam , guaiFENesin , sodium chloride  flush  Assessment/Plan   1. Acute on Chronic Systolic Heart Failure, end-stage - HF felt to be end-stage. EF has been 20-25% for the last 5 years.  - Last Echo 1/26 EF < 20%, RV mildly reduced  - S/p Med[employer] ICD and Barostim - Numerous hospitalizations over last 2 years  - Now readmitted w/ NYHA IV symptoms and low output, Lactic acid 2.2>>5.0   - on DBA 5 mcg. PICC placed. Co-ox pending  - She reports subjective improvement in breathing w/ IV Lasix  but still volume overloaded.  - continue IV Lasix , increase to 120 mg bid   - continue Farxiga  10 mg daily  - continue spironolactone  25 mg daily  - continue ivabradine  7.5 mg bid - continue digoxin  0.0625 mg daily. Dig level ok  - she is not a candidate for advanced therapies. She is DNR but still refusing palliative/hospice care    2. Acute on Chronic Hypoxic Respiratory Failure - multifactorial 2/2 a/c CHF and ACOPDE, currently on BiPAP  - diuresis per above - continue O2 - COPDE managment per IM    3. Hypokalemia - corrected, 2.9>>3.9 today  - monitor w/ ongoing diuresis - continue spiro    4. CKD Stage IIIb  - Creatinine baseline ~ 1.4-1.7 - Creatinine stable this admit, 1.58  - Cardiorenal  - monitor w/ diuresis    5. PAF  - In NSR - Continue Eliquis  2.5 mg bid. Reduced dose (weight/creatinine).   6. CAD - History of CABG x 2 2019 - s/p DES ostial ramus extending to left main (2020) - denies CP  - Continue atorvastatin  80 mg daily - No ASA with need for AC   6. H/o SCLC - Completed treatment 2015.  - CT chest (5/24) soft tissue thickening along mediastinum but no discrete mass    7. H/o Renal Infarct 2022 - Continue Eliquis . No bleeding    Length of Stay: 1  Brittainy Simmons, PA-C   01/15/2025, 9:34 AM  Advanced Heart Failure Team Pager [phone] (M-F; 7a - 5p)   Please visit [email or web address]: For overnight coverage please call cardiology fellow first. If fellow not available call Shock/ECMO MD on call.  For ECMO / Mechanical Support (Impella, IABP, LVAD) issues call Shock / ECMO MD on call.   Patient seen with PA, I formulated the plan and agree with the above note.   Lactate up to 5  yesterday pm but co-ox 84% today on dobutamine  5.  Creatinine 1.32=>1.58.  She is still short of breath.  I/Os not measured well (in ER still).   General: NAD, frail Neck: JVP 12-14, no thyromegaly or thyroid  nodule.  Lungs: Rhonchi throughout.  CV: Nondisplaced PMI.  Heart regular S1/S2, no S3/S4, no murmur.  No peripheral edema.    Abdomen: Soft, nontender, no hepatosplenomegaly, no distention.  Skin: Intact without lesions or rashes.  Neurologic: Alert and oriented x 3.  Psych: Normal affect. Extremities: No clubbing or cyanosis.  HEENT: Normal.   Acute on chronic systolic CHF, low output with elevated lactate.  History of end stage cardiomyopathy and not candidate for advanced therapies, she has not been ready for hospice transition though she is DNR. Creatinine 1.32 => 1.58.  Co-ox up to 84% on dobutamine  5.  She remains volume overloaded by exam, CVP not hooked up yet as she is in the ER.  - She is still not interested in hospice care.  - Follow CVP when on progressive.  - Lasix  120 mg IV bid today.  - Continue dobutamine  5 mcg/kg/min. I think that she would be a candidate for palliative home dobutamine  to try to keep her out of the hospital.  She understands that this could help her feel better but would not help her live longer.  - Continue Farxiga , spironolactone , ivabradine , digoxin  (level not elevated).    Lungs markedly rhonchorous, CXR without PNA.  I worry that she has a co-existing COPD exacerbation.   - Ceftriaxone  - Solumedrol 40 mg IV daily - Nebs.    Poor long-term  prognosis, low output HF and very frail.   Ezra Shuck 01/15/2025 11:59 AM        "

## 2025-01-15 NOTE — ED Notes (Signed)
 Pt placed on hospital bed for comfort.

## 2025-01-15 NOTE — Evaluation (Signed)
 Occupational Therapy Evaluation Patient Details Name: Brittany [name]  MRN: [id number] DOB: [birth year] Today's Date: 01/15/2025   History of Present Illness   Pt is 64 yo presenting to [facility] on 2/4 due to dyspnea over the past 2 days prior to admission. Pt admitted for acut on chronic systolic CHF. PMH: COPD on 3L, PAF, previous smoker, HTN, SCLC with chemo, chest XRT, and prophylactic brain radiation 2015, CAD s/p CABG 2019 and DES 2020, R renal infarct 2022, CHF with EF 25%, ICD, DM2, HLD     Clinical Impressions Brittany [name] was evaluated s/p the above admission list. She is indep at baseline. Upon evaluation the pt was limited by fatigue, lethargy and activity tolerance. Overall she is moving without assist and is likely near her baseline. She will benefit fm energy conservation education for ADL use at discharge. Pt will benefit from continued acute OT services and no follow up OT needed.       If plan is discharge home, recommend the following:   Assist for transportation;Assistance with cooking/housework     Functional Status Assessment   Patient has had a recent decline in their functional status and demonstrates the ability to make significant improvements in function in a reasonable and predictable amount of time.     Equipment Recommendations   None recommended by OT      Precautions/Restrictions   Precautions Precautions: Fall Recall of Precautions/Restrictions: Intact Restrictions Weight Bearing Restrictions Per Provider Order: No     Mobility Bed Mobility Overal bed mobility: Modified Independent             General bed mobility comments: increased time    Transfers Overall transfer level: Modified independent Equipment used: None Transfers: Sit to/from Stand                    Balance Overall balance assessment: Mild deficits observed, not formally tested, Modified Independent                                          ADL either performed or assessed with clinical judgement   ADL Overall ADL's : Needs assistance/impaired                                       General ADL Comments: limited assessment on evaluation - likely near baseline. limited by fatigue, sleepiness and food arriving     Vision Baseline Vision/History: 0 No visual deficits Vision Assessment?: No apparent visual deficits     Perception Perception: Not tested       Praxis Praxis: Not tested       Pertinent Vitals/Pain Pain Assessment Pain Assessment: No/denies pain     Extremity/Trunk Assessment Upper Extremity Assessment Upper Extremity Assessment: Overall WFL for tasks assessed   Lower Extremity Assessment Lower Extremity Assessment: Overall WFL for tasks assessed   Cervical / Trunk Assessment Cervical / Trunk Assessment: Normal   Communication Communication Communication: No apparent difficulties   Cognition Arousal: Alert Behavior During Therapy: Flat affect Cognition: No family/caregiver present to determine baseline, No apparent impairments             OT - Cognition Comments: overall seems WFL, flat this session                 Following commands: Intact  Cueing  General Comments   Cueing Techniques: Verbal cues;Tactile cues  VSS on 4L    Home Living Family/patient expects to be discharged to:: Private residence Living Arrangements: Alone Available Help at Discharge: Family;Friend(s);Available PRN/intermittently Type of Home: Apartment Home Access: Level entry     Home Layout: One level     Bathroom Shower/Tub: Chief Strategy Officer: Standard Bathroom Accessibility: Yes   Home Equipment: Educational Psychologist (4 wheels);Grab bars - tub/shower;Grab bars - toilet;Other (comment)          Prior Functioning/Environment Prior Level of Function : Independent/Modified Independent             Mobility Comments: ModI with rollator for  short distances, uses home O2 as needed, BiPaP ADLs Comments: Indep with ADLs. Pt states she doesn't drive often. Manages her own medications, finances, etc.    OT Problem List: Decreased activity tolerance   OT Treatment/Interventions: Therapeutic exercise;Energy conservation      OT Goals(Current goals can be found in the care plan section)   Acute Rehab OT Goals Patient Stated Goal: to eat lunch OT Goal Formulation: With patient Time For Goal Achievement: 01/29/25 Potential to Achieve Goals: Good ADL Goals Additional ADL Goal #1: pt will complete ADLs mod I using energy conservation techniques   OT Frequency:  Min 2X/week       AM-PAC OT 6 Clicks Daily Activity     Outcome Measure Help from another person eating meals?: None Help from another person taking care of personal grooming?: A Little Help from another person toileting, which includes using toliet, bedpan, or urinal?: A Little Help from another person bathing (including washing, rinsing, drying)?: A Little Help from another person to put on and taking off regular upper body clothing?: A Little Help from another person to put on and taking off regular lower body clothing?: A Little 6 Click Score: 19   End of Session Equipment Utilized During Treatment: Gait belt;Oxygen Nurse Communication: Mobility status  Activity Tolerance: Patient limited by fatigue;Patient limited by lethargy Patient left: in bed;with call bell/phone within reach  OT Visit Diagnosis: Muscle weakness (generalized) (M62.81)                Time: 8663-8648 OT Time Calculation (min): 15 min Charges:  OT General Charges $OT Visit: 1 Visit  Brittany [name], OTR/L Acute Rehabilitation Services Office [phone] Secure Chat Communication Preferred   Brittany [name] 01/15/2025, 2:44 PM

## 2025-01-15 NOTE — Assessment & Plan Note (Signed)
 Follow up as outpatient

## 2025-01-15 NOTE — Assessment & Plan Note (Signed)
 Continue ivabradine  for rate control and anticoagulation with apixaban 

## 2025-01-15 NOTE — Assessment & Plan Note (Signed)
 Continue blood pressure monitoring, continue diuresis with IV furosemide   Spironolactone 

## 2025-01-15 NOTE — Evaluation (Signed)
 Physical Therapy Evaluation Patient Details Name: Brittany [name]  MRN: [id number] DOB: 1961-04-20 Today's Date: 01/15/2025  History of Present Illness  Pt is 64 yo presenting to [facility] on 2/4 due to dyspnea over the past 2 days prior to admission. Pt admitted for acut on chronic systolic CHF. PMH: COPD on 3L, PAF, previous smoker, HTN, SCLC with chemo, chest XRT, and prophylactic brain radiation 2015, CAD s/p CABG 2019 and DES 2020, R renal infarct 2022, CHF with EF 25%, ICD, DM2, HLD  Clinical Impression  Currently pt is Mod I for bed mobility, sit to stand and was provided CGA for 30 ft of gait but was able to perform quick turns and step posteriorly without LOB most likely making her mod I. Pt lives alone and has some assist from a friend if needed. Pt prefers to not have any follow up physical therapy. Will sign off at this time. Please re-consult If further needs arise. Pt will benefit from continued mobility in acute setting with nursing staff and mobility specialists. Cleared from a mobility perspective to return home once medcially stable with continued use of rollator as needed.         If plan is discharge home, recommend the following: Assist for transportation;Assistance with cooking/housework     Equipment Recommendations BSC/3in1     Functional Status Assessment Patient has had a recent decline in their functional status and demonstrates the ability to make significant improvements in function in a reasonable and predictable amount of time.     Precautions / Restrictions Precautions Precautions: Fall Recall of Precautions/Restrictions: Intact Restrictions Weight Bearing Restrictions Per Provider Order: No      Mobility  Bed Mobility Overal bed mobility: Modified Independent             General bed mobility comments: increased time    Transfers Overall transfer level: Modified independent Equipment used: None Transfers: Sit to/from Stand Sit to Stand:  Modified independent (Device/Increase time)           General transfer comment: slightly unstable no LOB    Ambulation/Gait Ambulation/Gait assistance: Contact guard assist Gait Distance (Feet): 30 Feet Assistive device: None Gait Pattern/deviations: Step-through pattern, Decreased stride length Gait velocity: decreased Gait velocity interpretation: 1.31 - 2.62 ft/sec, indicative of limited community ambulator   General Gait Details: slightly unstable but able to perform quick turns and take steps posteriorly without LOB, CGA for safety, O2 sats in the 90's on 3L O2 via [place]. Most likely Mod I for gait     Balance Overall balance assessment: Mild deficits observed, not formally tested, Modified Independent         Pertinent Vitals/Pain Pain Assessment Pain Assessment: No/denies pain    Home Living Family/patient expects to be discharged to:: Private residence Living Arrangements: Alone Available Help at Discharge: Family;Friend(s);Available PRN/intermittently Type of Home: Apartment (studio) Home Access: Level entry       Home Layout: One level Home Equipment: Educational Psychologist (4 wheels);Grab bars - tub/shower;Grab bars - toilet;Other (comment)      Prior Function Prior Level of Function : Independent/Modified Independent             Mobility Comments: ModI with rollator for short distances, uses home O2 as needed, BiPaP ADLs Comments: Indep with ADLs. Pt states she doesn't drive often. Manages her own medications, finances, etc.     Extremity/Trunk Assessment   Upper Extremity Assessment Upper Extremity Assessment: Overall WFL for tasks assessed    Lower Extremity Assessment Lower Extremity Assessment:  Overall [facility] for tasks assessed    Cervical / Trunk Assessment Cervical / Trunk Assessment: Normal  Communication   Communication Communication: No apparent difficulties    Cognition Arousal: Alert Behavior During Therapy: WFL for tasks  assessed/performed   PT - Cognitive impairments: No apparent impairments       Following commands: Intact       Cueing Cueing Techniques: Verbal cues, Tactile cues     General Comments General comments (skin integrity, edema, etc.): vital signs stable on 3L o2 via [place] during activity        Assessment/Plan    PT Assessment Patient does not need any further PT services     PT Treatment Interventions DME instruction;Gait training;Functional mobility training;Therapeutic exercise;Therapeutic activities;Balance training;Neuromuscular re-education;Patient/family education    PT Goals (Current goals can be found in the Care Plan section)  Acute Rehab PT Goals PT Goal Formulation: All assessment and education complete, DC therapy    Frequency Min 2X/week        AM-PAC PT 6 Clicks Mobility  Outcome Measure Help needed turning from your back to your side while in a flat bed without using bedrails?: None Help needed moving from lying on your back to sitting on the side of a flat bed without using bedrails?: None Help needed moving to and from a bed to a chair (including a wheelchair)?: A Little Help needed standing up from a chair using your arms (e.g., wheelchair or bedside chair)?: A Little Help needed to walk in hospital room?: A Little Help needed climbing 3-5 steps with a railing? : A Little 6 Click Score: 20    End of Session Equipment Utilized During Treatment: Oxygen;Gait belt Activity Tolerance: Patient tolerated treatment well Patient left: in bed;with call bell/phone within reach;with nursing/sitter in room Nurse Communication: Mobility status      Time: 8875-8862 PT Time Calculation (min) (ACUTE ONLY): 13 min   Charges:   PT Evaluation $PT Eval Low Complexity: 1 Low   PT General Charges $$ ACUTE PT VISIT: 1 Visit        Dorothyann Maier, DPT, CLT  Acute Rehabilitation Services Office: [phone] (Secure chat preferred)   Dorothyann VEAR Maier 01/15/2025, 12:46 PM

## 2025-01-15 NOTE — Assessment & Plan Note (Addendum)
 Continue bronchodilator therapy and inhaled steroids Discontinue antibiotic therapy and systemic corticosteroids, likely the majority of her symptoms are related to pulmonary edema,  Airway clearing techniques with flutter valve and incentive spirometer.

## 2025-01-15 NOTE — Telephone Encounter (Signed)
 Advanced Heart Failure Patient Advocate Encounter  Prior authorization for Furo[employer]ix  has been submitted and approved. Test billing returns $60 for 9 day supply.  Key: [id number] Effective: 12/09/2024 to 02/14/2025  Rachel DEL, CPhT Rx Patient Advocate Phone: [phone]

## 2025-01-15 NOTE — Hospital Course (Addendum)
 Brittany [name]  was admitted to the hospital with the working diagnosis of heart failure exacerbation.   64 yo female with the past medical history of heart failure, coronary artery disease, paroxysmal atrial fibrillation, COPD, T2DM and lung cancer who presented with dyspnea for the last 2 days prior to admission.  Reported sudden onset of dyspnea, with no edema.  Because severity of symptoms EMS was called, she was found with increased work of breathing, received bronchodilator therapy, steroids and placed on Cpap, then transported to the ED.  On her initial physical examination her blood pressure was 108/61, HR 89, RR 21 and 02 saturation 100% on Bipap Lungs with diffuse wheezing bilaterally with rales and  rhonchi, heart with S1 and S2 present and regular, positive systolic murmur, abdomen with no distention and no lower extremity edema.   Na 134, K 3.1 Cl 90 bicarbonate 29, glucose 370 bun 45 cr 1.32  Pro BNP 13,411 Wbc 13.4 hgb 15.4 plt 270  Sars covid 19 negative RSV negative Influenza negative   Chest radiograph with hyperinflation, positive cardiomegaly, bilateral hilar vascular congestion, fluid in the right fissure, small right pleural effusion, sternotomy wires in place, defibrillator in place. Neuro stimulator in the right chest   EKG 105 bpm, left axis deviation, qtc 542, sinus rhythm with right atrial enlargement, with no significant ST segment or T wave changes  Patient placed on IV furosemide  and IV dobutamine 

## 2025-01-16 ENCOUNTER — Telehealth (HOSPITAL_COMMUNITY): Payer: Self-pay

## 2025-01-16 ENCOUNTER — Other Ambulatory Visit: Payer: Self-pay

## 2025-01-16 LAB — COOXEMETRY PANEL
Carboxyhemoglobin: 2.5 % — ABNORMAL HIGH (ref 0.5–1.5)
Carboxyhemoglobin: 1.2 % (ref 0.5–1.5)
Methemoglobin: <0.7 % (ref 0.0–1.5)
Methemoglobin: 1.4 % (ref 0.0–1.5)
O2 Saturation: 78.2 %
O2 Saturation: 86.5 %
Total hemoglobin: 14 g/dL (ref 12.0–16.0)
Total hemoglobin: 13.7 g/dL (ref 12.0–16.0)

## 2025-01-16 LAB — BASIC METABOLIC PANEL WITH GFR
Anion gap: 12 (ref 5–15)
Anion gap: 10 (ref 5–15)
BUN: 51 mg/dL — ABNORMAL HIGH (ref 8–23)
BUN: 49 mg/dL — ABNORMAL HIGH (ref 8–23)
CO2: 30 mmol/L (ref 22–32)
CO2: 31 mmol/L (ref 22–32)
Calcium: 9.8 mg/dL (ref 8.9–10.3)
Calcium: 9.1 mg/dL (ref 8.9–10.3)
Chloride: 97 mmol/L — ABNORMAL LOW (ref 98–111)
Chloride: 101 mmol/L (ref 98–111)
Creatinine, Ser: 1.27 mg/dL — ABNORMAL HIGH (ref 0.44–1.00)
Creatinine, Ser: 1.2 mg/dL — ABNORMAL HIGH (ref 0.44–1.00)
GFR, Estimated: 47 mL/min — ABNORMAL LOW
GFR, Estimated: 50 mL/min — ABNORMAL LOW
Glucose, Bld: 206 mg/dL — ABNORMAL HIGH (ref 70–99)
Glucose, Bld: 78 mg/dL (ref 70–99)
Potassium: 4.4 mmol/L (ref 3.5–5.1)
Potassium: 3.4 mmol/L — ABNORMAL LOW (ref 3.5–5.1)
Sodium: 138 mmol/L (ref 135–145)
Sodium: 142 mmol/L (ref 135–145)

## 2025-01-16 LAB — GLUCOSE, CAPILLARY
Glucose-Capillary: 213 mg/dL — ABNORMAL HIGH (ref 70–99)
Glucose-Capillary: 199 mg/dL — ABNORMAL HIGH (ref 70–99)
Glucose-Capillary: 125 mg/dL — ABNORMAL HIGH (ref 70–99)
Glucose-Capillary: 195 mg/dL — ABNORMAL HIGH (ref 70–99)

## 2025-01-16 MED ORDER — MORPHINE SULFATE (PF) 2 MG/ML IV SOLN
1.0000 mg | Freq: Three times a day (TID) | INTRAVENOUS | Status: AC | PRN
  Administered 2025-01-16: 1 mg via INTRAVENOUS
  Filled 2025-01-16: qty 1

## 2025-01-16 MED ORDER — POTASSIUM CHLORIDE CRYS ER 20 MEQ PO TBCR
40.0000 meq | EXTENDED_RELEASE_TABLET | Freq: Once | ORAL | Status: DC
  Filled 2025-01-16: qty 2

## 2025-01-16 MED ORDER — BUDESONIDE 0.5 MG/2ML IN SUSP
0.5000 mg | Freq: Two times a day (BID) | RESPIRATORY_TRACT | Status: AC
  Administered 2025-01-16: 0.5 mg via RESPIRATORY_TRACT
  Filled 2025-01-16: qty 2

## 2025-01-16 MED ORDER — SALINE SPRAY 0.65 % NA SOLN
1.0000 | NASAL | Status: AC | PRN
  Administered 2025-01-16: 1 via NASAL
  Filled 2025-01-16: qty 44

## 2025-01-16 MED ORDER — ALBUTEROL SULFATE (2.5 MG/3ML) 0.083% IN NEBU: 2.5000 mg | INHALATION_SOLUTION | Freq: Once | RESPIRATORY_TRACT | Status: DC

## 2025-01-16 MED ORDER — ARFORMOTEROL TARTRATE 15 MCG/2ML IN NEBU
15.0000 ug | INHALATION_SOLUTION | Freq: Two times a day (BID) | RESPIRATORY_TRACT | Status: AC
  Administered 2025-01-16 (×2): 15 ug via RESPIRATORY_TRACT
  Filled 2025-01-16 (×2): qty 2

## 2025-01-16 MED ORDER — ALBUTEROL SULFATE HFA 108 (90 BASE) MCG/ACT IN AERS
2.0000 | INHALATION_SPRAY | Freq: Once | RESPIRATORY_TRACT | Status: AC
  Administered 2025-01-16: 2 via RESPIRATORY_TRACT
  Filled 2025-01-16: qty 6.7

## 2025-01-16 MED ORDER — MORPHINE SULFATE (PF) 2 MG/ML IV SOLN
1.0000 mg | Freq: Once | INTRAVENOUS | Status: AC
  Administered 2025-01-16: 1 mg via INTRAVENOUS
  Filled 2025-01-16: qty 1

## 2025-01-16 MED ORDER — GUAIFENESIN ER 600 MG PO TB12
1200.0000 mg | ORAL_TABLET | Freq: Two times a day (BID) | ORAL | Status: AC
  Administered 2025-01-16 (×2): 1200 mg via ORAL
  Filled 2025-01-16 (×2): qty 2

## 2025-01-16 MED ORDER — LEVALBUTEROL HCL 0.63 MG/3ML IN NEBU: 0.6300 mg | INHALATION_SOLUTION | RESPIRATORY_TRACT | Status: AC | PRN

## 2025-01-16 MED ORDER — FUROSEMIDE 10 MG/ML IJ SOLN
80.0000 mg | Freq: Once | INTRAMUSCULAR | Status: AC
  Administered 2025-01-16: 80 mg via INTRAVENOUS
  Filled 2025-01-16: qty 8

## 2025-01-16 MED ORDER — POTASSIUM CHLORIDE 20 MEQ PO PACK
40.0000 meq | PACK | Freq: Once | ORAL | Status: AC
  Administered 2025-01-16: 40 meq via ORAL
  Filled 2025-01-16: qty 2

## 2025-01-16 NOTE — TOC Initial Note (Addendum)
 Transition of Care [facility]) - Initial/Assessment Note    Patient Details  Name: Brittany [name]  MRN: [id number] Date of Birth: 1961/11/11  [facility]) CM/SW Contact:    [name] Phone Number: [phone] 01/16/2025, 11:26 AM  Clinical Narrative:       10:53 AM   HF CSW attempted to meet with patient at bedside. Patient appeared to be lethargic. Patient asked CSW to come back at a later time.  HF CSW/Unit CM will continue to follow and monitor for dc readiness.                Patient Goals and CMS Choice            Expected Discharge Plan and Services                                              Prior Living Arrangements/Services                       Activities of Daily Living   ADL Screening (condition at time of admission) Independently performs ADLs?: Yes (appropriate for developmental age) Is the patient deaf or have difficulty hearing?: No Does the patient have difficulty seeing, even when wearing glasses/contacts?: No Does the patient have difficulty concentrating, remembering, or making decisions?: No  Permission Sought/Granted                  Emotional Assessment              Admission diagnosis:  Chronic systolic heart failure (HCC) [I50.22] COPD exacerbation (HCC) [J44.1] Acute on chronic respiratory failure with hypoxia (HCC) [J96.21] Acute on chronic respiratory failure, unspecified whether with hypoxia or hypercapnia (HCC) [J96.20] Acute on chronic congestive heart failure, unspecified heart failure type (HCC) [I50.9] Patient Active Problem List   Diagnosis Date Noted   Leukocytosis 01/14/2025   Hypokalemia 01/14/2025   Long term (current) use of anticoagulants 12/27/2024   Heart failure, diastolic, due to CAD (HCC) 12/26/2024   Acute exacerbation of CHF (congestive heart failure) (HCC) 12/26/2024   Acute on chronic HFrEF (heart failure with reduced ejection fraction) (HCC)  08/18/2024   Acute respiratory failure with hypoxia (HCC) 07/16/2024   Hyponatremia 07/16/2024   Acute respiratory failure (HCC) 04/01/2024   History of CAD (coronary artery disease) 12/15/2023   Hx of hypokalemia 12/15/2023   Respiratory distress 12/15/2023   Chronic respiratory failure with hypoxia and hypercapnia (HCC) 10/11/2023   Acute on chronic systolic CHF (congestive heart failure) (HCC) 09/25/2023   Acute kidney injury superimposed on chronic kidney disease 08/04/2023   Acute on chronic hypoxic respiratory failure (HCC) 08/03/2023   Trichomonas infection 04/19/2023   Presence of heart assist device (HCC) 04/11/2023   COPD with acute exacerbation (HCC) 03/01/2023   CAD (coronary artery disease) 03/01/2023   Acute hypoxic respiratory failure (HCC) 11/14/2022   Sepsis (HCC) 11/14/2022   Influenza A with pneumonia 11/14/2022   Elevated troponin 11/14/2022   History of lung cancer 11/14/2022   Acute combined systolic and diastolic CHF, NYHA class 4 (HCC) 07/09/2022   Malnutrition of moderate degree 06/20/2022   Lactic acidosis 04/12/2022   Prolonged QT interval 04/09/2022   Hypotension 04/09/2022   Chronic systolic heart failure (HCC) 12/11/2021   CHF exacerbation (HCC) 12/10/2021   Renal infarct 12/10/2021   COPD exacerbation (HCC) 09/29/2021  Mixed diabetic hyperlipidemia associated with type 2 diabetes mellitus (HCC) 09/29/2021   Chronic kidney disease, stage 3b (HCC) 09/29/2021   Unstable angina (HCC) 02/09/2021   Hemoptysis 10/20/2020   Non-insulin  dependent type 2 diabetes mellitus (HCC) 10/20/2020   Acute on chronic combined systolic and diastolic CHF (congestive heart failure) (HCC) 05/21/2020   Ischemic cardiomyopathy 04/12/2020   History of implantable cardioverter-defibrillator (ICD) placement 04/12/2020   NSTEMI (non-ST elevated myocardial infarction) (HCC) 02/12/2019   CHF (congestive heart failure) (HCC) 11/24/2018   Post-operative pain 09/10/2018   COPD  (chronic obstructive pulmonary disease) (HCC) 09/04/2018   Recurrent pleural effusion on right 05/05/2018   Acute on chronic respiratory failure with hypoxia (HCC) 05/05/2018   Chronic hypoxic respiratory failure (HCC)    Asthma    Anxiety    Allergy    HCAP (healthcare-associated pneumonia) 03/15/2015   Chest pain 03/15/2015   Small cell lung cancer (HCC) 03/16/2014   Protein-calorie malnutrition, severe 03/14/2014   PAF (paroxysmal atrial fibrillation) (HCC) 03/12/2014   Essential hypertension 05/07/2012   PCP:  Tanda Bleacher, MD Pharmacy:   [id number] - [place], [place] - [address] [place] [address] Phone: [phone] Fax: [phone]  [facility] [address] [place] [address] Phone: [phone] Fax: [phone]  [facility] - [facility] [address] [place] [address] Phone: [phone] Fax: [phone]     Social Drivers of Health (SDOH) Social History: SDOH Screenings   Food Insecurity: No Food Insecurity (01/15/2025)  Housing: Low Risk (01/15/2025)  Transportation Needs: No Transportation Needs (01/15/2025)  Recent Concern: Transportation Needs - Unmet Transportation Needs (11/17/2024)  Utilities: Not At Risk (01/15/2025)  Alcohol Screen: Low Risk (08/14/2024)  Depression (PHQ2-9): Low Risk (07/22/2024)  Physical Activity: Insufficiently Active (08/14/2024)  Social Connections: Moderately Isolated (01/15/2025)  Tobacco Use: Medium Risk (01/14/2025)   SDOH Interventions:     Readmission Risk Interventions    12/29/2024    1:44 PM 08/21/2024   12:01 PM 02/18/2024    3:51 PM  Readmission Risk Prevention Plan  Transportation Screening Complete Complete Complete  Medication Review Oceanographer) Complete Complete Complete  PCP or Specialist appointment within 3-5 days of discharge Complete Complete Complete  HRI or Home Care Consult Complete Patient refused Complete   Palliative Care Screening Not Applicable Patient Refused Not Applicable  Skilled Nursing Facility Not Applicable Not Applicable Not Applicable

## 2025-01-16 NOTE — Progress Notes (Signed)
 RT note. Patient found back on 3 L [place]. RN stated patient took her self off bipap. Asked patient if she would like to go back on bipap at this time due to labored breathing and coarse BBS, congested cough. Will let MD know. RT will continue to monitor.    01/16/25 1514  Aerosol Therapy Tx  $ Hand Held Nebulizer  1  Medications Duoneb  Delivery Source Oxygen  Delivery Device HHN  Mid-Treatment Pulse 95  Post-Treatment Pulse 95  Post-Treatment Respirations 19  Treatment Tolerance Tolerated well  RT Breath Sounds  Bilateral Breath Sounds Expiratory wheezes;Rhonchi  Oxygen Therapy/Pulse Ox  O2 Device (S)  Nasal Cannula  SpO2 99 %  O2 Therapy Oxygen  O2 Flow Rate (L/min) 3 L/min

## 2025-01-16 NOTE — Progress Notes (Incomplete)
 "                                                                                                                                                                                                         Palliative Medicine Progress Note   Patient Name: Brittany [name]        Date: 01/16/2025 DOB: [birth year]  Age: 64 y.o. MRN#: [id number] Attending Physician: Sebastian Toribio GAILS, MD Primary Care Physician: Tanda Bleacher, MD Admit Date: 01/14/2025  Reason for Consultation/Follow-up: {Reason for Consult:23484}  HPI/Patient Profile: 64 y.o. 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    Patient reports that she ran out of her medications for about a week.  After arriving to the ED via EMS, she was admitted for management of acute on chronic respiratory failure/COPD exacerbation and acute on chronic end-stage biventricular heart failure.   PMT has been consulted to assist with goals of care conversation.  She has had 3 admissions in the past 6 months, most recently less than 30 days ago. Per chart review, patient continues to state she is not ready for hospice.  She is well-known to PMT from previous hospitalizations, most recently seen by PMT in August 2025.  Subjective: Chart reviewed. Update received from Jordan Lee NP with HF team.    Objective:  Physical Exam Vitals reviewed.  Constitutional:      General: She is not in acute distress.    Appearance: She is ill-appearing.  Cardiovascular:     Rate and Rhythm: Normal rate.  Neurological:     Mental Status: She is oriented to person, place, and time.               Palliative Medicine Assessment & Plan    Assessment: Principal Problem:   Acute on chronic systolic CHF (congestive heart failure) (HCC) Active Problems:   Essential hypertension   PAF (paroxysmal atrial fibrillation) (HCC)   Small cell lung cancer (HCC)   Non-insulin  dependent type 2 diabetes mellitus (HCC)   Chronic kidney disease, stage 3b (HCC)   COPD with acute exacerbation (HCC)   CAD (coronary artery disease)    Recommendations/Plan: DNR/DNI Continue supportive care  Goals of Care and Additional Recommendations: Limitations on Scope of Treatment: {Recommended Scope and Preferences:21019}  Code Status:  Prognosis:  {Palliative Care Prognosis:23504}  Discharge Planning: {Palliative dispostion:23505}  Care plan was discussed with ***  Thank you for allowing the Palliative Medicine Team to assist in the care of this patient.   ***   Recardo KATHEE Loll, NP   Please contact Palliative Medicine Team phone at [phone] for questions and concerns.  For individual providers, please see AMION.      "

## 2025-01-16 NOTE — Progress Notes (Signed)
 Patient sated she could not breath and wants an inhaler or to be put on the BiPap. Notified Heart failure team and got orders to place patient on continuous BiPap. Respiratory at beside to place patient on BiPap. Will continue to monitor patient.

## 2025-01-16 NOTE — Telephone Encounter (Signed)
 Called to confirm/remind patient of their appointment at the [facility] on 01/19/25. However, patient has currently been admitted into hospital.

## 2025-01-16 NOTE — Progress Notes (Signed)
 " PROGRESS NOTE    Brittany [name]   FMW:[id number] DOB: Sep 13, 1961 DOA: 01/14/2025 PCP: Tanda Bleacher, MD    Chief Complaint  Patient presents with   Shortness of Breath    Brief Narrative:  Brittany [name]  was admitted to the hospital with the working diagnosis of heart failure exacerbation.    64 yo female with the past medical history of heart failure, coronary artery disease, paroxysmal atrial fibrillation, COPD, T2DM and lung cancer who presented with dyspnea for the last 2 days prior to admission.  Reported sudden onset of dyspnea, with no edema.  Because severity of symptoms EMS was called, she was found with increased work of breathing, received bronchodilator therapy, steroids and placed on Cpap, then transported to the ED.  On her initial physical examination her blood pressure was 108/61, HR 89, RR 21 and 02 saturation 100% on Bipap Lungs with diffuse wheezing bilaterally with rales and  rhonchi, heart with S1 and S2 present and regular, positive systolic murmur, abdomen with no distention and no lower extremity edema.    Na 134, K 3.1 Cl 90 bicarbonate 29, glucose 370 bun 45 cr 1.32  Pro BNP 13,411 Wbc 13.4 hgb 15.4 plt 270  Sars covid 19 negative RSV negative Influenza negative    Chest radiograph with hyperinflation, positive cardiomegaly, bilateral hilar vascular congestion, fluid in the right fissure, small right pleural effusion, sternotomy wires in place, defibrillator in place. Neuro stimulator in the right chest    EKG 105 bpm, left axis deviation, qtc 542, sinus rhythm with right atrial enlargement, with no significant ST segment or T wave changes   Patient placed on IV furosemide  and IV dobutamine   /heart failure team consulted and following Palliative care consulted     Assessment & Plan:   Principal Problem:   Acute on chronic systolic CHF (congestive heart failure) (HCC) Active Problems:   CAD (coronary artery disease)   PAF  (paroxysmal atrial fibrillation) (HCC)   Essential hypertension   COPD with acute exacerbation (HCC)   Chronic kidney disease, stage 3b (HCC)   Non-insulin  dependent type 2 diabetes mellitus (HCC)   Small cell lung cancer (HCC)   Acute on chronic systolic CHF (congestive heart failure) (HCC) Echocardiogram with reduced LV systolic function EF <20%, severe dilatation of internal cavity, EA fusion, RV systolic function with mild reduction, mild RV enlargement, RVSP 52.1 LA with severe dilatation, RV with mild dilatation, moderate to severe mitral regurgitation, mild to moderate tricuspid regurgitation.  Patient volume noted on examination - low output state and elevated lactic acid level -Co. ox of 78 on DBA 5.  CVP 11. - Patient being followed by cardiology/heart failure team and Lasix  increased to 120 mg every 12 hours. -Patient given extra dose of Lasix  80 mg IV x 1 per cardiology. - Patient started on BiPAP per cardiology  Continue SGLT 2, ivabradine , spironolactone  and digoxin  Inotropic support with dobutamine  No afterload reduction due to risk of hypotension  Patient with advanced heart failure, poor prognosis.    Acute hypoxemic respiratory failure due to acute cardiogenic pulmonary edema Continue diuresis and 02 supplementation.   CAD (coronary artery disease) No chest pain, no acute coronary syndrome  - per cardiology   PAF (paroxysmal atrial fibrillation) (HCC) Continue ivabradine  for rate control and anticoagulation with apixaban    Essential hypertension BP somewhat soft/borderline this morning.   - Patient on high-dose IV Lasix  and spironolactone .    COPD with acute exacerbation (HCC) Continue bronchodilator therapy and inhaled steroids Discontinued  antibiotic therapy and systemic corticosteroids, likely the majority of her symptoms are related to pulmonary edema. - Patient with no wheezing noted on exam. -Continue diuresis with IV Lasix  -Place on Mucinex  Airway  clearing techniques with flutter valve and incentive spirometer.    Chronic kidney disease, stage 3b (HCC) Renal function with serum cr at 1.58 with K at 3.9 and serum bicarbonate at 24 on 01/15/2025. - Urine output of 500 cc recorded over the past 24 hours -Creatinine trending down with diuresis. - IV Lasix  increased to 120 mg every 12 hours per cardiology and patient also on dobutamine . - Follow renal function     Non-insulin  dependent type 2 diabetes mellitus (HCC) Hemoglobin A1c 6.5 (08/19/2024) - CBG noted at 213 this morning. -Repeat hemoglobin A1c. - Steroids discontinued. -SSI -Continue Farxiga  -If blood glucose levels are further elevated may consider Semglee  8 units daily.   Small cell lung cancer [facility]) Outpatient follow-up       DVT prophylaxis: Eliquis  Code Status: DNR Family Communication: Updated patient.  No family at bedside. Disposition: TBD  Status is: Inpatient Remains inpatient appropriate because: Severity of illness   Consultants:  Cardiology/heart failure team: Dr. Rolan 01/14/2025 Palliative care: Dr. Jeryl 01/15/2025  Procedures:  Chest x-ray 01/14/2025   Antimicrobials:  Anti-infectives (From admission, onward)    Start     Dose/Rate Route Frequency Ordered Stop   01/14/25 1030  cefTRIAXone  (ROCEPHIN ) 1 g in sodium chloride  0.9 % 100 mL IVPB  Status:  Discontinued        1 g 200 mL/hr over 30 Minutes Intravenous Every 24 hours 01/14/25 1016 01/15/25 1005         Subjective: Patient sleeping but arousable.  Denies any chest pain.  Feels some improvement with shortness of breath.  Stated she felt very congested overnight and difficulty moving air and requesting albuterol  inhaler which seem to have helped.  Patient feels albuterol  inhaler helps better than the nebulizer treatments.  Patient denies any chest pain.  Patient is adamant about not going to hospice.  Objective: Vitals:   01/16/25 0914 01/16/25 1144 01/16/25 1155 01/16/25 1514  BP:   (!) 97/51 (!) 91/53   Pulse:  93    Resp:  14    Temp:  98 F (36.7 C)    TempSrc:  Oral    SpO2: 100% 99%  99%  Weight:      Height:        Intake/Output Summary (Last 24 hours) at 01/16/2025 1607 Last data filed at 01/16/2025 1400 Gross per 24 hour  Intake --  Output 900 ml  Net -900 ml   Filed Weights   01/14/25 0700 01/15/25 1648 01/16/25 0451  Weight: 36.3 kg 37.5 kg 38.4 kg    Examination:  General exam: Appears calm and comfortable  Respiratory system: Diffuse coarse rhonchorous/gurgling breath sounds.  No wheezing.  Fair air movement.  No use of accessory muscles of respiration.  Speaking in full sentences.  Cardiovascular system: Tachycardia.  RRR.  Positive JVD.  No significant pitting lower extremity edema.  Gastrointestinal system: Abdomen is nondistended, soft and nontender. No organomegaly or masses felt. Normal bowel sounds heard. Central nervous system: Alert and oriented. No focal neurological deficits. Extremities: Symmetric 5 x 5 power. Skin: No rashes, lesions or ulcers Psychiatry: Judgement and insight appear normal. Mood & affect appropriate.     Data Reviewed: I have personally reviewed following labs and imaging studies  CBC: Recent Labs  Lab 01/14/25 0655 01/14/25 9294  01/15/25 0332  WBC 13.4*  --  11.4*  NEUTROABS 10.2*  --   --   HGB 15.4* 17.3*  17.0* 13.3  HCT 47.0* 51.0*  50.0* 41.2  MCV 89.5  --  88.4  PLT 270  --  203    Basic Metabolic Panel: Recent Labs  Lab 01/14/25 0655 01/14/25 0705 01/15/25 0332 01/16/25 0435 01/16/25 1418  NA 134* 134*  132* 134* 138 142  K 3.1* 2.9*  2.9* 3.9 4.4 3.4*  CL 90* 91* 94* 97* 101  CO2 29  --  29 30 31   GLUCOSE 370* 339* 253* 206* 78  BUN 45* 46* 47* 51* 49*  CREATININE 1.32* 1.50* 1.58* 1.27* 1.20*  CALCIUM  10.4*  --  9.9 9.8 9.1    GFR: Estimated Creatinine Clearance: 28.7 mL/min (A) (by C-G formula based on SCr of 1.2 mg/dL (H)).  Liver Function Tests: Recent Labs  Lab  01/14/25 0655  AST 40  ALT 29  ALKPHOS 107  BILITOT 0.5  PROT 7.4  ALBUMIN  4.1    CBG: Recent Labs  Lab 01/15/25 1251 01/15/25 1727 01/15/25 2052 01/16/25 0601 01/16/25 1147  GLUCAP 260* 194* 314* 213* 199*     Recent Results (from the past 240 hours)  Resp panel by RT-PCR (RSV, Flu A&B, Covid) Anterior Nasal Swab     Status: None   Collection Time: 01/14/25  7:00 AM   Specimen: Anterior Nasal Swab  Result Value Ref Range Status   SARS Coronavirus 2 by RT PCR NEGATIVE NEGATIVE Final   Influenza A by PCR NEGATIVE NEGATIVE Final   Influenza B by PCR NEGATIVE NEGATIVE Final    Comment: (NOTE) The Xpert Xpress SARS-CoV-2/FLU/RSV plus assay is intended as an aid in the diagnosis of influenza from Nasopharyngeal swab specimens and should not be used as a sole basis for treatment. Nasal washings and aspirates are unacceptable for Xpert Xpress SARS-CoV-2/FLU/RSV testing.  Fact Sheet for Patients: bloggercourse.com  Fact Sheet for Healthcare Providers: seriousbroker.it  This test is not yet approved or cleared by the [place]  FDA and has been authorized for detection and/or diagnosis of SARS-CoV-2 by FDA under an Emergency Use Authorization (EUA). This EUA will remain in effect (meaning this test can be used) for the duration of the COVID-19 declaration under Section 564(b)(1) of the Act, 21 U.S.C. section 360bbb-3(b)(1), unless the authorization is terminated or revoked.     Resp Syncytial Virus by PCR NEGATIVE NEGATIVE Final    Comment: (NOTE) Fact Sheet for Patients: bloggercourse.com  Fact Sheet for Healthcare Providers: seriousbroker.it  This test is not yet approved or cleared by the [place]  FDA and has been authorized for detection and/or diagnosis of SARS-CoV-2 by FDA under an Emergency Use Authorization (EUA). This EUA will remain in effect  (meaning this test can be used) for the duration of the COVID-19 declaration under Section 564(b)(1) of the Act, 21 U.S.C. section 360bbb-3(b)(1), unless the authorization is terminated or revoked.  Performed at [facility], [address] [place], [place] [address]   MRSA Next Gen by PCR, Nasal     Status: None   Collection Time: 01/15/25  5:27 PM   Specimen: Nasal Mucosa; Nasal Swab  Result Value Ref Range Status   MRSA by PCR Next Gen NOT DETECTED NOT DETECTED Final    Comment: (NOTE) The GeneXpert MRSA Assay (FDA approved for NASAL specimens only), is one component of a comprehensive MRSA colonization surveillance program. It is not intended to diagnose  MRSA infection nor to guide or monitor treatment for MRSA infections. Test performance is not FDA approved in patients less than 78 years old. Performed at [facility], [address], [place], [place] [address]          Radiology Studies: No results found.       Scheduled Meds:  apixaban   2.5 mg Oral BID   arformoterol   15 mcg Nebulization BID   atorvastatin   80 mg Oral Daily   budesonide  (PULMICORT ) nebulizer solution  0.5 mg Nebulization BID   Chlorhexidine  Gluconate Cloth  6 each Topical Daily   dapagliflozin  propanediol  10 mg Oral Daily   digoxin   0.0625 mg Oral Daily   feeding supplement  1 Bottle Oral QID   guaiFENesin   1,200 mg Oral BID   insulin  aspart  0-5 Units Subcutaneous QHS   insulin  aspart  0-9 Units Subcutaneous TID WC   ipratropium-albuterol   3 mL Nebulization QID   ivabradine   7.5 mg Oral BID WC   mirtazapine   15 mg Oral QHS   pantoprazole   40 mg Oral Q0600   potassium chloride   20 mEq Oral Once   potassium chloride   40 mEq Oral Once   sodium chloride  flush  3 mL Intravenous Q12H   spironolactone   25 mg Oral Daily   Continuous Infusions:  DOBUTamine  2.5 mcg/kg/min (01/16/25 1154)   furosemide  120 mg (01/16/25 0615)     LOS: 2 days    Time spent: 40  minutes    Toribio Hummer, MD [facility]   To contact the attending provider between 7A-7P or the covering provider during after hours 7P-7A, please log into the web site [email or web address] and access using universal [place] password for that web site. If you do not have the password, please call the hospital operator.  01/16/2025, 4:07 PM    "

## 2025-01-16 NOTE — Inpatient Diabetes Management (Signed)
 Inpatient Diabetes Program Recommendations  AACE/ADA: New Consensus Statement on Inpatient Glycemic Control (2015)  Target Ranges:  Prepandial:   less than 140 mg/dL      Peak postprandial:   less than 180 mg/dL (1-2 hours)      Critically ill patients:  140 - 180 mg/dL   Lab Results  Component Value Date   GLUCAP 199 (H) 01/16/2025   HGBA1C 6.5 (H) 08/19/2024    Review of Glycemic Control  Latest Reference Range & Units 01/15/25 07:58 01/15/25 12:51 01/15/25 17:27 01/15/25 20:52 01/16/25 06:01 01/16/25 11:47  Glucose-Capillary 70 - 99 mg/dL 797 (H) 739 (H) 805 (H) 314 (H) 213 (H) 199 (H)   Diabetes history: DM 2 Outpatient Diabetes medications: Farxiga  10 mg Daily Current orders for Inpatient glycemic control:  Novolog  0-9 units tid + hs Farxiga  10 mg Daily  Ensure Plus high Protein (19 grams of carbohydrates) 4x/day  Would not do tight control on pt. Supplement aiding n postprandial spikes. Trends come right back down. Renal function elevated. Follow glucose trends for now.  Thanks,  Clotilda Bull RN, MSN, BC-ADM Inpatient Diabetes Coordinator Team Pager [phone] (8a-5p)

## 2025-01-16 NOTE — Progress Notes (Signed)
 Pt more wheezy this am and complaining of mucous plug with insistance that only albuteral inhaler helps. On call notified and ordered. Heart rate in the 120s and sustaining. CVP rose to 24, 0800 scheduled lasix  started at 0615. EKG attempted but with poor read due to ICD.

## 2025-01-16 NOTE — Progress Notes (Signed)
 Advanced Heart Failure Progress Update  Notified by RN that patient developed respiratory distress. O2sat remained stable on 3L . On exam, she is tachypnea to 40s, rales can be heard from the door.   - start BiPAP - given another dose IV lasix  80 mg  - give 1 mg IV morphine , patient is agreeable - stat BMET, co-ox  Emotion support provided. Patient tearful, states that she is so tired. Discussed that at any point she is able to say she has had enough and we will focus on making her feel comfortable. She states that she is still fighting. Will not move to the ICU at this point as patient is DNI and likely will not change management. Hemodynamics are stable at this time. Will continue to monitor her closely.   Ayah Cozzolino, NP 01/16/25, 1:57 PM  Advanced Heart Failure Team Pager [phone] (M-F; 7a - 5p)  Please contact [facility] for night-coverage after hours (4p -7a ) and weekends on [email or web address]

## 2025-01-16 NOTE — Progress Notes (Addendum)
 "    Advanced Heart Failure Rounding Note Cardiologist: None  AH[facility] Cardiologist: Dr. Cherrie  Chief Complaint: A/c End-Stage Systolic Heart Failure w/ Low Output Patient Profile   64 y.o. female with end-stage systolic heart failure HF w/ numerous admissions over the last year for CHF and COPD requiring inotropic support. Also w/ PAF, CAD s/p emergent CABG in 2019 and DES in 2020, R renal infarct in 2022, previous smoker quit in 2015, hypertension, previous small cell lung cancer treated with chemo, chest XRT and prophylactic brain radiation, presenting w/ a/c hypoxic respiratory failure and a/c NYHA Class IV CHF w/ low output.    Significant events:   2/4: Initial LA 2.5>>started DBA 2.5>>repeat LA 5.0>>DBA increased to 5 mcg  Subjective:    Co-ox 78 on DBA 5. CVP 11. sCr 1.58>1.27. Remains on 3L O2.   Lying in bed, tired. Does not feel well.   Objective:    Weight Range: 38.4 kg Body mass index is 17.1 kg/m.   Vital Signs:   Temp:  [97.6 F (36.4 C)-98.5 F (36.9 C)] 97.6 F (36.4 C) (02/06 0744) Pulse Rate:  [81-119] 112 (02/06 0744) Resp:  [14-20] 18 (02/06 0744) BP: (103-145)/(53-86) 122/74 (02/06 0744) SpO2:  [99 %-100 %] 100 % (02/06 0914) Weight:  [37.5 kg-38.4 kg] 38.4 kg (02/06 0451) Last BM Date : 01/15/25  Weight change: Filed Weights   01/14/25 0700 01/15/25 1648 01/16/25 0451  Weight: 36.3 kg 37.5 kg 38.4 kg   Intake/Output:  Intake/Output Summary (Last 24 hours) at 01/16/2025 1029 Last data filed at 01/15/2025 2130 Gross per 24 hour  Intake --  Output 500 ml  Net -500 ml    Physical Exam   General: Puny, chronically-ill appearing. No distress  Cardiac: JVP ~10cm. No murmurs  Resp: Lung sounds expiratory wheezing Extremities: Warm and dry.  No edema.   Telemetry   SR, ST 90-110s (personally reviewed)  Labs   CBC Recent Labs    01/14/25 0655 01/14/25 0705 01/15/25 0332  WBC 13.4*  --  11.4*  NEUTROABS 10.2*  --   --   HGB 15.4* 17.3*   17.0* 13.3  HCT 47.0* 51.0*  50.0* 41.2  MCV 89.5  --  88.4  PLT 270  --  203   Basic Metabolic Panel Recent Labs    97/94/73 0332 01/16/25 0435  NA 134* 138  K 3.9 4.4  CL 94* 97*  CO2 29 30  GLUCOSE 253* 206*  BUN 47* 51*  CREATININE 1.58* 1.27*  CALCIUM  9.9 9.8   Liver Function Tests Recent Labs    01/14/25 0655  AST 40  ALT 29  ALKPHOS 107  BILITOT 0.5  PROT 7.4  ALBUMIN  4.1   BNP (last 3 results) Recent Labs    09/16/24 1115 10/22/24 1526 11/17/24 1618  BNP 1,596.2* 1,589.7* 1,898.1*   ProBNP (last 3 results) Recent Labs    12/26/24 2003 01/14/25 0655  PROBNP 12,902.0* 13,411.0*   Medications:    Scheduled Medications:  apixaban   2.5 mg Oral BID   arformoterol   15 mcg Nebulization BID   atorvastatin   80 mg Oral Daily   budesonide  (PULMICORT ) nebulizer solution  0.5 mg Nebulization BID   Chlorhexidine  Gluconate Cloth  6 each Topical Daily   dapagliflozin  propanediol  10 mg Oral Daily   digoxin   0.0625 mg Oral Daily   feeding supplement  1 Bottle Oral QID   guaiFENesin   1,200 mg Oral BID   insulin  aspart  0-5 Units Subcutaneous  QHS   insulin  aspart  0-9 Units Subcutaneous TID WC   ipratropium-albuterol   3 mL Nebulization QID   ivabradine   7.5 mg Oral BID WC   mirtazapine   15 mg Oral QHS   pantoprazole   40 mg Oral Q0600   potassium chloride   20 mEq Oral Once   sodium chloride  flush  3 mL Intravenous Q12H   spironolactone   25 mg Oral Daily    Infusions:  DOBUTamine  5 mcg/kg/min (01/15/25 1109)   furosemide  120 mg (01/16/25 0615)    PRN Medications: acetaminophen  **OR** acetaminophen , ALPRAZolam , levalbuterol , sodium chloride , sodium chloride  flush  Assessment/Plan   1. Acute on Chronic Systolic Heart Failure, end-stage - HF felt to be end-stage. EF has been 20-25% for the last 5 years.  - Last Echo 1/26 EF < 20%, RV mildly reduced  - S/p Medtronic ICD and Barostim - Numerous hospitalizations over last 2 years  - Now readmitted w/  NYHA IV symptoms and low output, Lactic acid 2.2>>5.0   - on DBA 5 mcg. PICC placed. Co-ox pending  - She reports subjective improvement in breathing w/ IV Lasix  but still volume overloaded.  - continue IV lasix  120 mg bid   - continue Farxiga  10 mg daily  - continue spironolactone  25 mg daily  - continue ivabradine  7.5 mg bid - continue digoxin  0.0625 mg daily. Dig level ok  - she is not a candidate for advanced therapies. She is DNR but still refusing palliative/hospice care    2. Acute on Chronic Hypoxic Respiratory Failure - multifactorial 2/2 a/c CHF and ACOPDE, currently on BiPAP  - diuresis per above - continue O2 - COPDE managment per IM    3. Hypokalemia - corrected, 2.9>>3.9 today  - monitor w/ ongoing diuresis - continue spiro    4. CKD Stage IIIb  - Creatinine baseline ~ 1.4-1.7 - Creatinine stable this admit, 1.58  - Cardiorenal  - monitor w/ diuresis    5. PAF  - In NSR - Continue Eliquis  2.5 mg bid. Reduced dose (weight/creatinine).   6. CAD - History of CABG x 2 2019 - s/p DES ostial ramus extending to left main (2020) - denies CP  - Continue atorvastatin  80 mg daily - No ASA with need for AC   6. H/o SCLC - Completed treatment 2015.  - CT chest (5/24) soft tissue thickening along mediastinum but no discrete mass    7. H/o Renal Infarct 2022 - Continue Eliquis . No bleeding  Length of Stay: 2  Jordan Lee, NP  01/16/2025, 10:29 AM  Advanced Heart Failure Team Pager [phone] (M-F; 7a - 5p)   Please visit [email or web address]: For overnight coverage please call cardiology fellow first. If fellow not available call Shock/ECMO MD on call.  For ECMO / Mechanical Support (Impella, IABP, LVAD) issues call Shock / ECMO MD on call.   Agree with above.   Developed recurrent pulmonary edema and severe respiratory distress today. Remains on DBA. Now on bipap and received some morphine  for comfort  General:  Sitting up in bed. Cachectic. Chronically ill appearing On  bipap HEENT: normal Neck: supple. JVP to jaw  Cor: Regular rate & rhythm. Lungs:+ rhonchi and wheeze Abdomen: soft, nontender, nondistended.Good bowel sounds. Extremities: no cyanosis, clubbing, rash, edema Neuro: alert & orientedx3, cranial nerves grossly intact. moves all 4 extremities w/o difficulty. Affect pleasant  She has end-stage HF and now inotrope dependent. We have been unable to maintain her outside of the hospital. I discussed options with her including home  DBA (previously she was not a candidate due to living situation) or comfort care. She said she is not ready for comfort care yet. I explained to her that her time is very limited and we nee to make sure that we have a plan in place to help her avoid suffering as her HF gets worse.   I asked her if I could call her son and she said no. But she is ok with me calling her sister - which I will do.   CRITICAL CARE Performed by: Cherrie Sieving  Total critical care time: 42 minutes  Critical care time was exclusive of separately billable procedures and treating other patients.  Critical care was necessary to treat or prevent imminent or life-threatening deterioration.  Critical care was time spent personally by me (independent of midlevel providers or residents) on the following activities: development of treatment plan with patient and/or surrogate as well as nursing, discussions with consultants, evaluation of patient's response to treatment, examination of patient, obtaining history from patient or surrogate, ordering and performing treatments and interventions, ordering and review of laboratory studies, ordering and review of radiographic studies, pulse oximetry and re-evaluation of patient's condition.  Sieving Cherrie, MD  4:10 PM   "

## 2025-01-16 NOTE — Progress Notes (Signed)
 Mobility Specialist Progress Note;   01/16/25 0901  Mobility  Activity Refused and notified nurse if applicable  Mobility Specialist Start Time (ACUTE ONLY) 0901   Pt received in bed, immediately refusing mobility despite encouragement. States  she hasn't had much sleep and is only waiting for the doctors. Offered to f/u late afternoon, pt also refused. Left with all needs met  Gwendolen Hewlett G, BS Mobility Specialist Please contact via SecureChat or Rehab Office [phone]

## 2025-01-16 NOTE — TOC Initial Note (Addendum)
 Transition of Care [facility]) - Initial/Assessment Note    Patient Details  Name: Brittany [name]  MRN: [id number] Date of Birth: May 10, 1961  [facility] CM/SW Contact:    [name] Phone Number: [phone] 01/16/2025, 3:19 PM  Clinical Narrative:      HF CSW met with patient at bedside. Patient was tearful and reported just receiving some bad news. Patient staed that she was overwhelmed and do not wish to speak at this this time. CSW asked has anything chagned since last vist. Patient stated no. Patient stated that she lives alone. Patient stated that she drives. Patient stated that she has no history of HH services. Patient stated that she uses oxygen, rollator, bipap and cpap machine. Patient stated that she has scale at home. Patient stated that she has a PCP. CSW explained that a hospital follow up appointment is typically scheduled closer towards dc. Patient is agreeable.    HF CSW/Unit CM will continue to follow and monitor for dc readiness.                 Expected Discharge Plan: Home/Self Care Barriers to Discharge: Continued Medical Work up   Patient Goals and CMS Choice Patient states their goals for this hospitalization and ongoing recovery are:: return home CMS Medicare.gov Compare Post Acute Care list provided to:: Patient Choice offered to / list presented to : Patient [place] ownership interest in [facility].provided to:: Patient    Expected Discharge Plan and Services       Living arrangements for the past 2 months: Hotel/Motel                                      Prior Living Arrangements/Services Living arrangements for the past 2 months: Hotel/Motel Lives with:: Self Patient language and need for interpreter reviewed:: Yes Do you feel safe going back to the place where you live?: Yes      Need for Family Participation in Patient Care: Yes (Comment) Care giver support system in place?: No (comment)    Criminal Activity/Legal Involvement Pertinent to Current Situation/Hospitalization: No - Comment as needed  Activities of Daily Living   ADL Screening (condition at time of admission) Independently performs ADLs?: Yes (appropriate for developmental age) Is the patient deaf or have difficulty hearing?: No Does the patient have difficulty seeing, even when wearing glasses/contacts?: No Does the patient have difficulty concentrating, remembering, or making decisions?: No  Permission Sought/Granted Permission sought to share information with : Case Manager                Emotional Assessment Appearance:: Appears older than stated age Attitude/Demeanor/Rapport: Crying Affect (typically observed): Anxious Orientation: : Oriented to Self, Oriented to Place, Oriented to  Time, Oriented to Situation Alcohol / Substance Use: Not Applicable Psych Involvement: No (comment)  Admission diagnosis:  Chronic systolic heart failure (HCC) [I50.22] COPD exacerbation (HCC) [J44.1] Acute on chronic respiratory failure with hypoxia (HCC) [J96.21] Acute on chronic respiratory failure, unspecified whether with hypoxia or hypercapnia (HCC) [J96.20] Acute on chronic congestive heart failure, unspecified heart failure type [facility]) [I50.9] Patient Active Problem List   Diagnosis Date Noted   Leukocytosis 01/14/2025   Hypokalemia 01/14/2025   Long term (current) use of anticoagulants 12/27/2024   Heart failure, diastolic, due to CAD (HCC) 12/26/2024   Acute exacerbation of CHF (congestive heart failure) (HCC) 12/26/2024   Acute on chronic  HFrEF (heart failure with reduced ejection fraction) (HCC) 08/18/2024   Acute respiratory failure with hypoxia (HCC) 07/16/2024   Hyponatremia 07/16/2024   Acute respiratory failure (HCC) 04/01/2024   History of CAD (coronary artery disease) 12/15/2023   Hx of hypokalemia 12/15/2023   Respiratory distress 12/15/2023   Chronic respiratory failure with hypoxia and  hypercapnia (HCC) 10/11/2023   Acute on chronic systolic CHF (congestive heart failure) (HCC) 09/25/2023   Acute kidney injury superimposed on chronic kidney disease 08/04/2023   Acute on chronic hypoxic respiratory failure (HCC) 08/03/2023   Trichomonas infection 04/19/2023   Presence of heart assist device (HCC) 04/11/2023   COPD with acute exacerbation (HCC) 03/01/2023   CAD (coronary artery disease) 03/01/2023   Acute hypoxic respiratory failure (HCC) 11/14/2022   Sepsis (HCC) 11/14/2022   Influenza A with pneumonia 11/14/2022   Elevated troponin 11/14/2022   History of lung cancer 11/14/2022   Acute combined systolic and diastolic CHF, NYHA class 4 (HCC) 07/09/2022   Malnutrition of moderate degree 06/20/2022   Lactic acidosis 04/12/2022   Prolonged QT interval 04/09/2022   Hypotension 04/09/2022   Chronic systolic heart failure (HCC) 12/11/2021   CHF exacerbation (HCC) 12/10/2021   Renal infarct 12/10/2021   COPD exacerbation (HCC) 09/29/2021   Mixed diabetic hyperlipidemia associated with type 2 diabetes mellitus (HCC) 09/29/2021   Chronic kidney disease, stage 3b (HCC) 09/29/2021   Unstable angina (HCC) 02/09/2021   Hemoptysis 10/20/2020   Non-insulin  dependent type 2 diabetes mellitus (HCC) 10/20/2020   Acute on chronic combined systolic and diastolic CHF (congestive heart failure) (HCC) 05/21/2020   Ischemic cardiomyopathy 04/12/2020   History of implantable cardioverter-defibrillator (ICD) placement 04/12/2020   NSTEMI (non-ST elevated myocardial infarction) (HCC) 02/12/2019   CHF (congestive heart failure) (HCC) 11/24/2018   Post-operative pain 09/10/2018   COPD (chronic obstructive pulmonary disease) (HCC) 09/04/2018   Recurrent pleural effusion on right 05/05/2018   Acute on chronic respiratory failure with hypoxia (HCC) 05/05/2018   Chronic hypoxic respiratory failure (HCC)    Asthma    Anxiety    Allergy    HCAP (healthcare-associated pneumonia) 03/15/2015    Chest pain 03/15/2015   Small cell lung cancer (HCC) 03/16/2014   Protein-calorie malnutrition, severe 03/14/2014   PAF (paroxysmal atrial fibrillation) (HCC) 03/12/2014   Essential hypertension 05/07/2012   PCP:  Tanda Bleacher, MD Pharmacy:   [id number] - [place], [place] - [address] [place] [place] [address] Phone: [phone] Fax: [phone]  [facility] [address] [place] [address] Phone: [phone] Fax: [phone]  [facility] - [facility] [address] [place] [address] Phone: [phone] Fax: [phone]     Social Drivers of Health (SDOH) Social History: SDOH Screenings   Food Insecurity: No Food Insecurity (01/15/2025)  Housing: Low Risk (01/15/2025)  Transportation Needs: No Transportation Needs (01/15/2025)  Recent Concern: Transportation Needs - Unmet Transportation Needs (11/17/2024)  Utilities: Not At Risk (01/15/2025)  Alcohol Screen: Low Risk (08/14/2024)  Depression (PHQ2-9): Low Risk (07/22/2024)  Physical Activity: Insufficiently Active (08/14/2024)  Social Connections: Moderately Isolated (01/15/2025)  Tobacco Use: Medium Risk (01/14/2025)   SDOH Interventions:     Readmission Risk Interventions    12/29/2024    1:44 PM 08/21/2024   12:01 PM 02/18/2024    3:51 PM  Readmission Risk Prevention Plan  Transportation Screening Complete Complete Complete  Medication Review Oceanographer) Complete Complete Complete  PCP or Specialist appointment within 3-5 days of  discharge Complete Complete Complete  HRI or Home Care Consult Complete Patient refused Complete  Palliative Care Screening Not Applicable Patient Refused Not Applicable  Skilled Nursing Facility Not Applicable Not Applicable Not Applicable

## 2025-01-16 NOTE — Progress Notes (Signed)
 TR[facility] night cross cover note:   Per patient preference, I have changed her existing order for potassium chloride  40 meq p.o. in pill form x 1 dose now to potassium chloride  40 meq x 1 dose via packet.     Eva Pore, DO Hospitalist

## 2025-01-19 ENCOUNTER — Ambulatory Visit (HOSPITAL_COMMUNITY)

## 2025-03-30 ENCOUNTER — Ambulatory Visit

## 2025-06-29 ENCOUNTER — Ambulatory Visit

## 2025-09-28 ENCOUNTER — Ambulatory Visit

## 2025-12-28 ENCOUNTER — Ambulatory Visit

## 2026-03-29 ENCOUNTER — Ambulatory Visit

## 2026-06-28 ENCOUNTER — Ambulatory Visit

## 2026-09-27 ENCOUNTER — Ambulatory Visit

## 2026-12-27 ENCOUNTER — Ambulatory Visit
